# Patient Record
Sex: Female | Born: 1955 | Race: Black or African American | Hispanic: No | Marital: Married | Smoking: Former smoker
Health system: Southern US, Community
[De-identification: ages and names within clinical notes are randomized; demographics above are authoritative.]

## PROBLEM LIST (undated history)

## (undated) ENCOUNTER — Emergency Department (HOSPITAL_COMMUNITY): Admission: EM | Payer: BC Managed Care – PPO | Source: Home / Self Care

## (undated) DIAGNOSIS — I5032 Chronic diastolic (congestive) heart failure: Secondary | ICD-10-CM

## (undated) DIAGNOSIS — R011 Cardiac murmur, unspecified: Secondary | ICD-10-CM

## (undated) DIAGNOSIS — I779 Disorder of arteries and arterioles, unspecified: Secondary | ICD-10-CM

## (undated) DIAGNOSIS — Z992 Dependence on renal dialysis: Secondary | ICD-10-CM

## (undated) DIAGNOSIS — I219 Acute myocardial infarction, unspecified: Secondary | ICD-10-CM

## (undated) DIAGNOSIS — N184 Chronic kidney disease, stage 4 (severe): Secondary | ICD-10-CM

## (undated) DIAGNOSIS — D469 Myelodysplastic syndrome, unspecified: Secondary | ICD-10-CM

## (undated) DIAGNOSIS — I10 Essential (primary) hypertension: Secondary | ICD-10-CM

## (undated) DIAGNOSIS — I350 Nonrheumatic aortic (valve) stenosis: Secondary | ICD-10-CM

## (undated) DIAGNOSIS — I480 Paroxysmal atrial fibrillation: Secondary | ICD-10-CM

## (undated) DIAGNOSIS — D539 Nutritional anemia, unspecified: Secondary | ICD-10-CM

## (undated) DIAGNOSIS — D649 Anemia, unspecified: Secondary | ICD-10-CM

## (undated) DIAGNOSIS — E039 Hypothyroidism, unspecified: Secondary | ICD-10-CM

## (undated) DIAGNOSIS — M109 Gout, unspecified: Secondary | ICD-10-CM

## (undated) DIAGNOSIS — I050 Rheumatic mitral stenosis: Secondary | ICD-10-CM

## (undated) DIAGNOSIS — N179 Acute kidney failure, unspecified: Secondary | ICD-10-CM

## (undated) DIAGNOSIS — N186 End stage renal disease: Secondary | ICD-10-CM

## (undated) DIAGNOSIS — I739 Peripheral vascular disease, unspecified: Secondary | ICD-10-CM

## (undated) DIAGNOSIS — E119 Type 2 diabetes mellitus without complications: Secondary | ICD-10-CM

## (undated) DIAGNOSIS — R0600 Dyspnea, unspecified: Secondary | ICD-10-CM

## (undated) DIAGNOSIS — C189 Malignant neoplasm of colon, unspecified: Secondary | ICD-10-CM

## (undated) DIAGNOSIS — C801 Malignant (primary) neoplasm, unspecified: Secondary | ICD-10-CM

## (undated) DIAGNOSIS — I272 Pulmonary hypertension, unspecified: Secondary | ICD-10-CM

## (undated) DIAGNOSIS — I701 Atherosclerosis of renal artery: Secondary | ICD-10-CM

## (undated) HISTORY — PX: TUBAL LIGATION: SHX77

## (undated) HISTORY — DX: Gout, unspecified: M10.9

## (undated) HISTORY — PX: COLON SURGERY: SHX602

---
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
## 1997-12-27 ENCOUNTER — Other Ambulatory Visit: Admission: RE | Admit: 1997-12-27 | Discharge: 1997-12-27 | Payer: Self-pay | Admitting: Internal Medicine

## 1997-12-28 ENCOUNTER — Other Ambulatory Visit: Admission: RE | Admit: 1997-12-28 | Discharge: 1997-12-28 | Payer: Self-pay | Admitting: *Deleted

## 1998-01-02 ENCOUNTER — Ambulatory Visit (HOSPITAL_COMMUNITY): Admission: RE | Admit: 1998-01-02 | Discharge: 1998-01-02 | Payer: Self-pay | Admitting: *Deleted

## 1999-03-30 ENCOUNTER — Other Ambulatory Visit: Admission: RE | Admit: 1999-03-30 | Discharge: 1999-03-30 | Payer: Self-pay | Admitting: Internal Medicine

## 2000-03-25 ENCOUNTER — Other Ambulatory Visit: Admission: RE | Admit: 2000-03-25 | Discharge: 2000-03-25 | Payer: Self-pay | Admitting: Internal Medicine

## 2000-07-30 ENCOUNTER — Emergency Department (HOSPITAL_COMMUNITY): Admission: EM | Admit: 2000-07-30 | Discharge: 2000-07-30 | Payer: Self-pay | Admitting: Emergency Medicine

## 2001-06-02 ENCOUNTER — Other Ambulatory Visit: Admission: RE | Admit: 2001-06-02 | Discharge: 2001-06-02 | Payer: Self-pay | Admitting: Internal Medicine

## 2006-07-15 DIAGNOSIS — C189 Malignant neoplasm of colon, unspecified: Secondary | ICD-10-CM

## 2006-07-15 DIAGNOSIS — C801 Malignant (primary) neoplasm, unspecified: Secondary | ICD-10-CM

## 2006-07-15 HISTORY — DX: Malignant neoplasm of colon, unspecified: C18.9

## 2006-07-15 HISTORY — DX: Malignant (primary) neoplasm, unspecified: C80.1

## 2006-10-22 ENCOUNTER — Encounter (INDEPENDENT_AMBULATORY_CARE_PROVIDER_SITE_OTHER): Payer: Self-pay | Admitting: Specialist

## 2006-10-22 ENCOUNTER — Ambulatory Visit (HOSPITAL_COMMUNITY): Admission: RE | Admit: 2006-10-22 | Discharge: 2006-10-22 | Payer: Self-pay | Admitting: Gastroenterology

## 2006-10-27 ENCOUNTER — Encounter: Admission: RE | Admit: 2006-10-27 | Discharge: 2006-10-27 | Payer: Self-pay | Admitting: Gastroenterology

## 2006-11-19 ENCOUNTER — Encounter (INDEPENDENT_AMBULATORY_CARE_PROVIDER_SITE_OTHER): Payer: Self-pay | Admitting: Specialist

## 2006-11-19 ENCOUNTER — Inpatient Hospital Stay (HOSPITAL_COMMUNITY): Admission: RE | Admit: 2006-11-19 | Discharge: 2006-11-25 | Payer: Self-pay | Admitting: General Surgery

## 2006-11-24 ENCOUNTER — Ambulatory Visit: Payer: Self-pay | Admitting: Oncology

## 2006-12-05 LAB — COMPREHENSIVE METABOLIC PANEL
ALT: 14 U/L (ref 0–35)
AST: 13 U/L (ref 0–37)
Albumin: 4.4 g/dL (ref 3.5–5.2)
Alkaline Phosphatase: 70 U/L (ref 39–117)
BUN: 14 mg/dL (ref 6–23)
CO2: 27 mEq/L (ref 19–32)
Calcium: 9.7 mg/dL (ref 8.4–10.5)
Chloride: 103 mEq/L (ref 96–112)
Creatinine, Ser: 0.94 mg/dL (ref 0.40–1.20)
Glucose, Bld: 208 mg/dL — ABNORMAL HIGH (ref 70–99)
Potassium: 4 mEq/L (ref 3.5–5.3)
Sodium: 141 mEq/L (ref 135–145)
Total Bilirubin: 0.5 mg/dL (ref 0.3–1.2)
Total Protein: 7.6 g/dL (ref 6.0–8.3)

## 2006-12-05 LAB — CEA: CEA: 1.2 ng/mL (ref 0.0–5.0)

## 2006-12-05 LAB — CBC WITH DIFFERENTIAL/PLATELET
BASO%: 0.4 % (ref 0.0–2.0)
Basophils Absolute: 0 10*3/uL (ref 0.0–0.1)
EOS%: 2.4 % (ref 0.0–7.0)
Eosinophils Absolute: 0.2 10*3/uL (ref 0.0–0.5)
HCT: 30.7 % — ABNORMAL LOW (ref 34.8–46.6)
HGB: 10.6 g/dL — ABNORMAL LOW (ref 11.6–15.9)
LYMPH%: 17.7 % (ref 14.0–48.0)
MCH: 33.4 pg (ref 26.0–34.0)
MCHC: 34.5 g/dL (ref 32.0–36.0)
MCV: 96.9 fL (ref 81.0–101.0)
MONO#: 0.7 10*3/uL (ref 0.1–0.9)
MONO%: 8.2 % (ref 0.0–13.0)
NEUT#: 5.7 10*3/uL (ref 1.5–6.5)
NEUT%: 71.3 % (ref 39.6–76.8)
Platelets: 337 10*3/uL (ref 145–400)
RBC: 3.17 10*6/uL — ABNORMAL LOW (ref 3.70–5.32)
RDW: 14.4 % (ref 11.3–14.5)
WBC: 8.1 10*3/uL (ref 3.9–10.0)
lymph#: 1.4 10*3/uL (ref 0.9–3.3)

## 2006-12-18 ENCOUNTER — Ambulatory Visit (HOSPITAL_BASED_OUTPATIENT_CLINIC_OR_DEPARTMENT_OTHER): Admission: RE | Admit: 2006-12-18 | Discharge: 2006-12-18 | Payer: Self-pay | Admitting: General Surgery

## 2006-12-31 LAB — COMPREHENSIVE METABOLIC PANEL
ALT: 17 U/L (ref 0–35)
AST: 17 U/L (ref 0–37)
Albumin: 3.5 g/dL (ref 3.5–5.2)
Alkaline Phosphatase: 59 U/L (ref 39–117)
BUN: 10 mg/dL (ref 6–23)
CO2: 26 mEq/L (ref 19–32)
Calcium: 9.3 mg/dL (ref 8.4–10.5)
Chloride: 105 mEq/L (ref 96–112)
Creatinine, Ser: 0.78 mg/dL (ref 0.40–1.20)
Glucose, Bld: 175 mg/dL — ABNORMAL HIGH (ref 70–99)
Potassium: 4 mEq/L (ref 3.5–5.3)
Sodium: 141 mEq/L (ref 135–145)
Total Bilirubin: 0.2 mg/dL — ABNORMAL LOW (ref 0.3–1.2)
Total Protein: 7.1 g/dL (ref 6.0–8.3)

## 2006-12-31 LAB — CBC WITH DIFFERENTIAL/PLATELET
BASO%: 0.5 % (ref 0.0–2.0)
Basophils Absolute: 0 10*3/uL (ref 0.0–0.1)
EOS%: 1.4 % (ref 0.0–7.0)
Eosinophils Absolute: 0.1 10*3/uL (ref 0.0–0.5)
HCT: 31.9 % — ABNORMAL LOW (ref 34.8–46.6)
HGB: 11 g/dL — ABNORMAL LOW (ref 11.6–15.9)
LYMPH%: 20.7 % (ref 14.0–48.0)
MCH: 33.2 pg (ref 26.0–34.0)
MCHC: 34.3 g/dL (ref 32.0–36.0)
MCV: 96.6 fL (ref 81.0–101.0)
MONO#: 0.6 10*3/uL (ref 0.1–0.9)
MONO%: 8.4 % (ref 0.0–13.0)
NEUT#: 5.1 10*3/uL (ref 1.5–6.5)
NEUT%: 69 % (ref 39.6–76.8)
Platelets: 298 10*3/uL (ref 145–400)
RBC: 3.31 10*6/uL — ABNORMAL LOW (ref 3.70–5.32)
RDW: 13.8 % (ref 11.3–14.5)
WBC: 7.5 10*3/uL (ref 3.9–10.0)
lymph#: 1.5 10*3/uL (ref 0.9–3.3)

## 2007-01-08 ENCOUNTER — Ambulatory Visit: Payer: Self-pay | Admitting: Oncology

## 2007-01-14 LAB — COMPREHENSIVE METABOLIC PANEL
ALT: 15 U/L (ref 0–35)
AST: 16 U/L (ref 0–37)
Albumin: 4.2 g/dL (ref 3.5–5.2)
Alkaline Phosphatase: 75 U/L (ref 39–117)
BUN: 13 mg/dL (ref 6–23)
CO2: 22 mEq/L (ref 19–32)
Calcium: 8.8 mg/dL (ref 8.4–10.5)
Chloride: 102 mEq/L (ref 96–112)
Creatinine, Ser: 0.86 mg/dL (ref 0.40–1.20)
Glucose, Bld: 201 mg/dL — ABNORMAL HIGH (ref 70–99)
Potassium: 4 mEq/L (ref 3.5–5.3)
Sodium: 136 mEq/L (ref 135–145)
Total Bilirubin: 0.5 mg/dL (ref 0.3–1.2)
Total Protein: 7.5 g/dL (ref 6.0–8.3)

## 2007-01-14 LAB — CBC WITH DIFFERENTIAL/PLATELET
BASO%: 0.6 % (ref 0.0–2.0)
Basophils Absolute: 0 10*3/uL (ref 0.0–0.1)
EOS%: 0.7 % (ref 0.0–7.0)
Eosinophils Absolute: 0.1 10*3/uL (ref 0.0–0.5)
HCT: 31 % — ABNORMAL LOW (ref 34.8–46.6)
HGB: 10.9 g/dL — ABNORMAL LOW (ref 11.6–15.9)
LYMPH%: 18.5 % (ref 14.0–48.0)
MCH: 33.7 pg (ref 26.0–34.0)
MCHC: 35.3 g/dL (ref 32.0–36.0)
MCV: 95.4 fL (ref 81.0–101.0)
MONO#: 0.9 10*3/uL (ref 0.1–0.9)
MONO%: 11.5 % (ref 0.0–13.0)
NEUT#: 5.2 10*3/uL (ref 1.5–6.5)
NEUT%: 68.7 % (ref 39.6–76.8)
Platelets: 290 10*3/uL (ref 145–400)
RBC: 3.25 10*6/uL — ABNORMAL LOW (ref 3.70–5.32)
RDW: 13.2 % (ref 11.3–14.5)
WBC: 7.5 10*3/uL (ref 3.9–10.0)
lymph#: 1.4 10*3/uL (ref 0.9–3.3)

## 2007-01-27 LAB — CBC WITH DIFFERENTIAL/PLATELET
BASO%: 0.4 % (ref 0.0–2.0)
Basophils Absolute: 0 10*3/uL (ref 0.0–0.1)
EOS%: 1.2 % (ref 0.0–7.0)
Eosinophils Absolute: 0.1 10*3/uL (ref 0.0–0.5)
HCT: 30.3 % — ABNORMAL LOW (ref 34.8–46.6)
HGB: 10.7 g/dL — ABNORMAL LOW (ref 11.6–15.9)
LYMPH%: 25.4 % (ref 14.0–48.0)
MCH: 34.6 pg — ABNORMAL HIGH (ref 26.0–34.0)
MCHC: 35.2 g/dL (ref 32.0–36.0)
MCV: 98.3 fL (ref 81.0–101.0)
MONO#: 0.7 10*3/uL (ref 0.1–0.9)
MONO%: 13.5 % — ABNORMAL HIGH (ref 0.0–13.0)
NEUT#: 3.1 10*3/uL (ref 1.5–6.5)
NEUT%: 59.5 % (ref 39.6–76.8)
Platelets: 238 10*3/uL (ref 145–400)
RBC: 3.09 10*6/uL — ABNORMAL LOW (ref 3.70–5.32)
RDW: 15.9 % — ABNORMAL HIGH (ref 11.3–14.5)
WBC: 5.2 10*3/uL (ref 3.9–10.0)
lymph#: 1.3 10*3/uL (ref 0.9–3.3)

## 2007-01-27 LAB — COMPREHENSIVE METABOLIC PANEL
ALT: 17 U/L (ref 0–35)
AST: 17 U/L (ref 0–37)
Albumin: 3.3 g/dL — ABNORMAL LOW (ref 3.5–5.2)
Alkaline Phosphatase: 63 U/L (ref 39–117)
BUN: 10 mg/dL (ref 6–23)
CO2: 27 mEq/L (ref 19–32)
Calcium: 9 mg/dL (ref 8.4–10.5)
Chloride: 105 mEq/L (ref 96–112)
Creatinine, Ser: 0.93 mg/dL (ref 0.40–1.20)
Glucose, Bld: 208 mg/dL — ABNORMAL HIGH (ref 70–99)
Potassium: 4.2 mEq/L (ref 3.5–5.3)
Sodium: 140 mEq/L (ref 135–145)
Total Bilirubin: 0.7 mg/dL (ref 0.3–1.2)
Total Protein: 6.7 g/dL (ref 6.0–8.3)

## 2007-02-10 LAB — CBC WITH DIFFERENTIAL/PLATELET
BASO%: 0.5 % (ref 0.0–2.0)
Basophils Absolute: 0 10*3/uL (ref 0.0–0.1)
EOS%: 0.8 % (ref 0.0–7.0)
Eosinophils Absolute: 0.1 10*3/uL (ref 0.0–0.5)
HCT: 32.4 % — ABNORMAL LOW (ref 34.8–46.6)
HGB: 11.2 g/dL — ABNORMAL LOW (ref 11.6–15.9)
LYMPH%: 24.5 % (ref 14.0–48.0)
MCH: 34.3 pg — ABNORMAL HIGH (ref 26.0–34.0)
MCHC: 34.7 g/dL (ref 32.0–36.0)
MCV: 98.7 fL (ref 81.0–101.0)
MONO#: 0.9 10*3/uL (ref 0.1–0.9)
MONO%: 14 % — ABNORMAL HIGH (ref 0.0–13.0)
NEUT#: 3.9 10*3/uL (ref 1.5–6.5)
NEUT%: 60.2 % (ref 39.6–76.8)
Platelets: 185 10*3/uL (ref 145–400)
RBC: 3.28 10*6/uL — ABNORMAL LOW (ref 3.70–5.32)
RDW: 14.5 % (ref 11.3–14.5)
WBC: 6.4 10*3/uL (ref 3.9–10.0)
lymph#: 1.6 10*3/uL (ref 0.9–3.3)

## 2007-02-10 LAB — COMPREHENSIVE METABOLIC PANEL
ALT: 14 U/L (ref 0–35)
AST: 12 U/L (ref 0–37)
Albumin: 3.9 g/dL (ref 3.5–5.2)
Alkaline Phosphatase: 84 U/L (ref 39–117)
BUN: 13 mg/dL (ref 6–23)
CO2: 21 mEq/L (ref 19–32)
Calcium: 8.9 mg/dL (ref 8.4–10.5)
Chloride: 106 mEq/L (ref 96–112)
Creatinine, Ser: 0.87 mg/dL (ref 0.40–1.20)
Glucose, Bld: 208 mg/dL — ABNORMAL HIGH (ref 70–99)
Potassium: 4 mEq/L (ref 3.5–5.3)
Sodium: 142 mEq/L (ref 135–145)
Total Bilirubin: 0.4 mg/dL (ref 0.3–1.2)
Total Protein: 7.3 g/dL (ref 6.0–8.3)

## 2007-02-10 LAB — CEA: CEA: 1.2 ng/mL (ref 0.0–5.0)

## 2007-02-20 ENCOUNTER — Ambulatory Visit: Payer: Self-pay | Admitting: Oncology

## 2007-02-24 LAB — COMPREHENSIVE METABOLIC PANEL
ALT: 13 U/L (ref 0–35)
AST: 14 U/L (ref 0–37)
Albumin: 3.9 g/dL (ref 3.5–5.2)
Alkaline Phosphatase: 84 U/L (ref 39–117)
BUN: 12 mg/dL (ref 6–23)
CO2: 23 mEq/L (ref 19–32)
Calcium: 9 mg/dL (ref 8.4–10.5)
Chloride: 105 mEq/L (ref 96–112)
Creatinine, Ser: 0.86 mg/dL (ref 0.40–1.20)
Glucose, Bld: 199 mg/dL — ABNORMAL HIGH (ref 70–99)
Potassium: 4 mEq/L (ref 3.5–5.3)
Sodium: 142 mEq/L (ref 135–145)
Total Bilirubin: 0.3 mg/dL (ref 0.3–1.2)
Total Protein: 7.2 g/dL (ref 6.0–8.3)

## 2007-02-24 LAB — CBC WITH DIFFERENTIAL/PLATELET
BASO%: 0.5 % (ref 0.0–2.0)
Basophils Absolute: 0 10*3/uL (ref 0.0–0.1)
EOS%: 1.2 % (ref 0.0–7.0)
Eosinophils Absolute: 0 10*3/uL (ref 0.0–0.5)
HCT: 30.9 % — ABNORMAL LOW (ref 34.8–46.6)
HGB: 10.8 g/dL — ABNORMAL LOW (ref 11.6–15.9)
LYMPH%: 30 % (ref 14.0–48.0)
MCH: 34.5 pg — ABNORMAL HIGH (ref 26.0–34.0)
MCHC: 34.9 g/dL (ref 32.0–36.0)
MCV: 98.9 fL (ref 81.0–101.0)
MONO#: 0.6 10*3/uL (ref 0.1–0.9)
MONO%: 14.9 % — ABNORMAL HIGH (ref 0.0–13.0)
NEUT#: 2.2 10*3/uL (ref 1.5–6.5)
NEUT%: 53.4 % (ref 39.6–76.8)
Platelets: 181 10*3/uL (ref 145–400)
RBC: 3.12 10*6/uL — ABNORMAL LOW (ref 3.70–5.32)
RDW: 16.8 % — ABNORMAL HIGH (ref 11.3–14.5)
WBC: 4.1 10*3/uL (ref 3.9–10.0)
lymph#: 1.2 10*3/uL (ref 0.9–3.3)

## 2007-03-24 LAB — COMPREHENSIVE METABOLIC PANEL
ALT: 14 U/L (ref 0–35)
AST: 17 U/L (ref 0–37)
Albumin: 4 g/dL (ref 3.5–5.2)
Alkaline Phosphatase: 83 U/L (ref 39–117)
BUN: 10 mg/dL (ref 6–23)
CO2: 23 mEq/L (ref 19–32)
Calcium: 8.7 mg/dL (ref 8.4–10.5)
Chloride: 106 mEq/L (ref 96–112)
Creatinine, Ser: 0.83 mg/dL (ref 0.40–1.20)
Glucose, Bld: 109 mg/dL — ABNORMAL HIGH (ref 70–99)
Potassium: 4.1 mEq/L (ref 3.5–5.3)
Sodium: 142 mEq/L (ref 135–145)
Total Bilirubin: 0.3 mg/dL (ref 0.3–1.2)
Total Protein: 7.3 g/dL (ref 6.0–8.3)

## 2007-03-24 LAB — CBC WITH DIFFERENTIAL/PLATELET
BASO%: 0.3 % (ref 0.0–2.0)
Basophils Absolute: 0 10*3/uL (ref 0.0–0.1)
EOS%: 0.6 % (ref 0.0–7.0)
Eosinophils Absolute: 0 10*3/uL (ref 0.0–0.5)
HCT: 31.2 % — ABNORMAL LOW (ref 34.8–46.6)
HGB: 11 g/dL — ABNORMAL LOW (ref 11.6–15.9)
LYMPH%: 30.4 % (ref 14.0–48.0)
MCH: 35 pg — ABNORMAL HIGH (ref 26.0–34.0)
MCHC: 35.3 g/dL (ref 32.0–36.0)
MCV: 99.3 fL (ref 81.0–101.0)
MONO#: 0.9 10*3/uL (ref 0.1–0.9)
MONO%: 17.8 % — ABNORMAL HIGH (ref 0.0–13.0)
NEUT#: 2.6 10*3/uL (ref 1.5–6.5)
NEUT%: 50.9 % (ref 39.6–76.8)
Platelets: 180 10*3/uL (ref 145–400)
RBC: 3.14 10*6/uL — ABNORMAL LOW (ref 3.70–5.32)
RDW: 16.7 % — ABNORMAL HIGH (ref 11.3–14.5)
WBC: 5 10*3/uL (ref 3.9–10.0)
lymph#: 1.5 10*3/uL (ref 0.9–3.3)

## 2007-04-06 ENCOUNTER — Ambulatory Visit: Payer: Self-pay | Admitting: Oncology

## 2007-04-07 LAB — CBC WITH DIFFERENTIAL/PLATELET
BASO%: 0.3 % (ref 0.0–2.0)
Basophils Absolute: 0 10*3/uL (ref 0.0–0.1)
EOS%: 0.5 % (ref 0.0–7.0)
Eosinophils Absolute: 0 10*3/uL (ref 0.0–0.5)
HCT: 33.4 % — ABNORMAL LOW (ref 34.8–46.6)
HGB: 11.7 g/dL (ref 11.6–15.9)
LYMPH%: 39.2 % (ref 14.0–48.0)
MCH: 35.4 pg — ABNORMAL HIGH (ref 26.0–34.0)
MCHC: 35.1 g/dL (ref 32.0–36.0)
MCV: 100.9 fL (ref 81.0–101.0)
MONO#: 0.8 10*3/uL (ref 0.1–0.9)
MONO%: 18.3 % — ABNORMAL HIGH (ref 0.0–13.0)
NEUT#: 1.8 10*3/uL (ref 1.5–6.5)
NEUT%: 41.7 % (ref 39.6–76.8)
Platelets: 174 10*3/uL (ref 145–400)
RBC: 3.31 10*6/uL — ABNORMAL LOW (ref 3.70–5.32)
RDW: 17.6 % — ABNORMAL HIGH (ref 11.3–14.5)
WBC: 4.3 10*3/uL (ref 3.9–10.0)
lymph#: 1.7 10*3/uL (ref 0.9–3.3)

## 2007-04-07 LAB — COMPREHENSIVE METABOLIC PANEL
ALT: 16 U/L (ref 0–35)
AST: 18 U/L (ref 0–37)
Albumin: 4.1 g/dL (ref 3.5–5.2)
Alkaline Phosphatase: 86 U/L (ref 39–117)
BUN: 11 mg/dL (ref 6–23)
CO2: 22 mEq/L (ref 19–32)
Calcium: 8.9 mg/dL (ref 8.4–10.5)
Chloride: 102 mEq/L (ref 96–112)
Creatinine, Ser: 0.85 mg/dL (ref 0.40–1.20)
Glucose, Bld: 197 mg/dL — ABNORMAL HIGH (ref 70–99)
Potassium: 4 mEq/L (ref 3.5–5.3)
Sodium: 137 mEq/L (ref 135–145)
Total Bilirubin: 0.3 mg/dL (ref 0.3–1.2)
Total Protein: 7.8 g/dL (ref 6.0–8.3)

## 2007-04-07 LAB — CEA: CEA: 1.2 ng/mL (ref 0.0–5.0)

## 2007-04-21 LAB — CBC WITH DIFFERENTIAL/PLATELET
BASO%: 0.1 % (ref 0.0–2.0)
Basophils Absolute: 0 10*3/uL (ref 0.0–0.1)
EOS%: 0.3 % (ref 0.0–7.0)
Eosinophils Absolute: 0 10*3/uL (ref 0.0–0.5)
HCT: 32.8 % — ABNORMAL LOW (ref 34.8–46.6)
HGB: 11.4 g/dL — ABNORMAL LOW (ref 11.6–15.9)
LYMPH%: 34.6 % (ref 14.0–48.0)
MCH: 35.4 pg — ABNORMAL HIGH (ref 26.0–34.0)
MCHC: 34.8 g/dL (ref 32.0–36.0)
MCV: 101.7 fL — ABNORMAL HIGH (ref 81.0–101.0)
MONO#: 0.8 10*3/uL (ref 0.1–0.9)
MONO%: 17.3 % — ABNORMAL HIGH (ref 0.0–13.0)
NEUT#: 2.1 10*3/uL (ref 1.5–6.5)
NEUT%: 47.7 % (ref 39.6–76.8)
Platelets: 161 10*3/uL (ref 145–400)
RBC: 3.22 10*6/uL — ABNORMAL LOW (ref 3.70–5.32)
RDW: 17.3 % — ABNORMAL HIGH (ref 11.3–14.5)
WBC: 4.4 10*3/uL (ref 3.9–10.0)
lymph#: 1.5 10*3/uL (ref 0.9–3.3)

## 2007-04-21 LAB — COMPREHENSIVE METABOLIC PANEL
ALT: 15 U/L (ref 0–35)
AST: 17 U/L (ref 0–37)
Albumin: 4 g/dL (ref 3.5–5.2)
Alkaline Phosphatase: 81 U/L (ref 39–117)
BUN: 10 mg/dL (ref 6–23)
CO2: 23 mEq/L (ref 19–32)
Calcium: 9.4 mg/dL (ref 8.4–10.5)
Chloride: 101 mEq/L (ref 96–112)
Creatinine, Ser: 0.91 mg/dL (ref 0.40–1.20)
Glucose, Bld: 183 mg/dL — ABNORMAL HIGH (ref 70–99)
Potassium: 4.3 mEq/L (ref 3.5–5.3)
Sodium: 140 mEq/L (ref 135–145)
Total Bilirubin: 0.4 mg/dL (ref 0.3–1.2)
Total Protein: 7.2 g/dL (ref 6.0–8.3)

## 2007-04-21 LAB — CEA: CEA: 0.7 ng/mL (ref 0.0–5.0)

## 2007-05-05 LAB — COMPREHENSIVE METABOLIC PANEL
ALT: 16 U/L (ref 0–35)
AST: 19 U/L (ref 0–37)
Albumin: 4 g/dL (ref 3.5–5.2)
Alkaline Phosphatase: 75 U/L (ref 39–117)
BUN: 17 mg/dL (ref 6–23)
CO2: 23 mEq/L (ref 19–32)
Calcium: 9 mg/dL (ref 8.4–10.5)
Chloride: 104 mEq/L (ref 96–112)
Creatinine, Ser: 1.01 mg/dL (ref 0.40–1.20)
Glucose, Bld: 199 mg/dL — ABNORMAL HIGH (ref 70–99)
Potassium: 4.1 mEq/L (ref 3.5–5.3)
Sodium: 143 mEq/L (ref 135–145)
Total Bilirubin: 0.3 mg/dL (ref 0.3–1.2)
Total Protein: 7.3 g/dL (ref 6.0–8.3)

## 2007-05-05 LAB — CBC WITH DIFFERENTIAL/PLATELET
BASO%: 0.4 % (ref 0.0–2.0)
Basophils Absolute: 0 10*3/uL (ref 0.0–0.1)
EOS%: 0.3 % (ref 0.0–7.0)
Eosinophils Absolute: 0 10*3/uL (ref 0.0–0.5)
HCT: 32 % — ABNORMAL LOW (ref 34.8–46.6)
HGB: 11.3 g/dL — ABNORMAL LOW (ref 11.6–15.9)
LYMPH%: 30.7 % (ref 14.0–48.0)
MCH: 35.8 pg — ABNORMAL HIGH (ref 26.0–34.0)
MCHC: 35.2 g/dL (ref 32.0–36.0)
MCV: 101.8 fL — ABNORMAL HIGH (ref 81.0–101.0)
MONO#: 0.7 10*3/uL (ref 0.1–0.9)
MONO%: 16.4 % — ABNORMAL HIGH (ref 0.0–13.0)
NEUT#: 2.1 10*3/uL (ref 1.5–6.5)
NEUT%: 52.2 % (ref 39.6–76.8)
Platelets: 175 10*3/uL (ref 145–400)
RBC: 3.15 10*6/uL — ABNORMAL LOW (ref 3.70–5.32)
RDW: 18 % — ABNORMAL HIGH (ref 11.3–14.5)
WBC: 4 10*3/uL (ref 3.9–10.0)
lymph#: 1.2 10*3/uL (ref 0.9–3.3)

## 2007-05-05 LAB — CEA: CEA: 1.1 ng/mL (ref 0.0–5.0)

## 2007-05-19 LAB — COMPREHENSIVE METABOLIC PANEL
ALT: 23 U/L (ref 0–35)
AST: 24 U/L (ref 0–37)
Albumin: 3.9 g/dL (ref 3.5–5.2)
Alkaline Phosphatase: 73 U/L (ref 39–117)
BUN: 12 mg/dL (ref 6–23)
CO2: 22 mEq/L (ref 19–32)
Calcium: 9 mg/dL (ref 8.4–10.5)
Chloride: 103 mEq/L (ref 96–112)
Creatinine, Ser: 0.91 mg/dL (ref 0.40–1.20)
Glucose, Bld: 252 mg/dL — ABNORMAL HIGH (ref 70–99)
Potassium: 4 mEq/L (ref 3.5–5.3)
Sodium: 140 mEq/L (ref 135–145)
Total Bilirubin: 0.3 mg/dL (ref 0.3–1.2)
Total Protein: 7.2 g/dL (ref 6.0–8.3)

## 2007-05-19 LAB — CBC WITH DIFFERENTIAL/PLATELET
BASO%: 0.3 % (ref 0.0–2.0)
Basophils Absolute: 0 10*3/uL (ref 0.0–0.1)
EOS%: 0.4 % (ref 0.0–7.0)
Eosinophils Absolute: 0 10*3/uL (ref 0.0–0.5)
HCT: 31.8 % — ABNORMAL LOW (ref 34.8–46.6)
HGB: 11.1 g/dL — ABNORMAL LOW (ref 11.6–15.9)
LYMPH%: 30.6 % (ref 14.0–48.0)
MCH: 36.5 pg — ABNORMAL HIGH (ref 26.0–34.0)
MCHC: 35 g/dL (ref 32.0–36.0)
MCV: 104.3 fL — ABNORMAL HIGH (ref 81.0–101.0)
MONO#: 0.8 10*3/uL (ref 0.1–0.9)
MONO%: 18.1 % — ABNORMAL HIGH (ref 0.0–13.0)
NEUT#: 2.1 10*3/uL (ref 1.5–6.5)
NEUT%: 50.6 % (ref 39.6–76.8)
Platelets: 151 10*3/uL (ref 145–400)
RBC: 3.05 10*6/uL — ABNORMAL LOW (ref 3.70–5.32)
RDW: 17.3 % — ABNORMAL HIGH (ref 11.3–14.5)
WBC: 4.2 10*3/uL (ref 3.9–10.0)
lymph#: 1.3 10*3/uL (ref 0.9–3.3)

## 2007-05-19 LAB — CEA: CEA: 1.1 ng/mL (ref 0.0–5.0)

## 2007-05-20 ENCOUNTER — Ambulatory Visit: Payer: Self-pay | Admitting: Oncology

## 2007-06-02 LAB — CBC WITH DIFFERENTIAL/PLATELET
BASO%: 0.2 % (ref 0.0–2.0)
Basophils Absolute: 0 10*3/uL (ref 0.0–0.1)
EOS%: 0.3 % (ref 0.0–7.0)
Eosinophils Absolute: 0 10*3/uL (ref 0.0–0.5)
HCT: 33.7 % — ABNORMAL LOW (ref 34.8–46.6)
HGB: 11.7 g/dL (ref 11.6–15.9)
LYMPH%: 30.4 % (ref 14.0–48.0)
MCH: 35.8 pg — ABNORMAL HIGH (ref 26.0–34.0)
MCHC: 34.8 g/dL (ref 32.0–36.0)
MCV: 103 fL — ABNORMAL HIGH (ref 81.0–101.0)
MONO#: 1.3 10*3/uL — ABNORMAL HIGH (ref 0.1–0.9)
MONO%: 25.4 % — ABNORMAL HIGH (ref 0.0–13.0)
NEUT#: 2.3 10*3/uL (ref 1.5–6.5)
NEUT%: 43.7 % (ref 39.6–76.8)
Platelets: 140 10*3/uL — ABNORMAL LOW (ref 145–400)
RBC: 3.27 10*6/uL — ABNORMAL LOW (ref 3.70–5.32)
RDW: 17 % — ABNORMAL HIGH (ref 11.3–14.5)
WBC: 5.2 10*3/uL (ref 3.9–10.0)
lymph#: 1.6 10*3/uL (ref 0.9–3.3)

## 2007-06-02 LAB — COMPREHENSIVE METABOLIC PANEL
ALT: 22 U/L (ref 0–35)
AST: 26 U/L (ref 0–37)
Albumin: 3.9 g/dL (ref 3.5–5.2)
Alkaline Phosphatase: 79 U/L (ref 39–117)
BUN: 10 mg/dL (ref 6–23)
CO2: 22 mEq/L (ref 19–32)
Calcium: 9.1 mg/dL (ref 8.4–10.5)
Chloride: 105 mEq/L (ref 96–112)
Creatinine, Ser: 0.89 mg/dL (ref 0.40–1.20)
Glucose, Bld: 141 mg/dL — ABNORMAL HIGH (ref 70–99)
Potassium: 4.3 mEq/L (ref 3.5–5.3)
Sodium: 144 mEq/L (ref 135–145)
Total Bilirubin: 0.4 mg/dL (ref 0.3–1.2)
Total Protein: 7.5 g/dL (ref 6.0–8.3)

## 2007-06-02 LAB — CEA: CEA: 0.9 ng/mL (ref 0.0–5.0)

## 2007-06-29 ENCOUNTER — Ambulatory Visit (HOSPITAL_COMMUNITY): Admission: RE | Admit: 2007-06-29 | Discharge: 2007-06-29 | Payer: Self-pay | Admitting: Oncology

## 2007-06-30 ENCOUNTER — Ambulatory Visit: Payer: Self-pay | Admitting: Oncology

## 2007-07-02 LAB — COMPREHENSIVE METABOLIC PANEL
ALT: 17 U/L (ref 0–35)
AST: 19 U/L (ref 0–37)
Albumin: 4 g/dL (ref 3.5–5.2)
Alkaline Phosphatase: 72 U/L (ref 39–117)
BUN: 13 mg/dL (ref 6–23)
CO2: 24 mEq/L (ref 19–32)
Calcium: 9.1 mg/dL (ref 8.4–10.5)
Chloride: 104 mEq/L (ref 96–112)
Creatinine, Ser: 0.81 mg/dL (ref 0.40–1.20)
Glucose, Bld: 171 mg/dL — ABNORMAL HIGH (ref 70–99)
Potassium: 4 mEq/L (ref 3.5–5.3)
Sodium: 141 mEq/L (ref 135–145)
Total Bilirubin: 0.4 mg/dL (ref 0.3–1.2)
Total Protein: 7.2 g/dL (ref 6.0–8.3)

## 2007-07-02 LAB — CBC WITH DIFFERENTIAL/PLATELET
BASO%: 0.7 % (ref 0.0–2.0)
Basophils Absolute: 0 10*3/uL (ref 0.0–0.1)
EOS%: 0.1 % (ref 0.0–7.0)
Eosinophils Absolute: 0 10*3/uL (ref 0.0–0.5)
HCT: 34.2 % — ABNORMAL LOW (ref 34.8–46.6)
HGB: 11.9 g/dL (ref 11.6–15.9)
LYMPH%: 30.3 % (ref 14.0–48.0)
MCH: 36.2 pg — ABNORMAL HIGH (ref 26.0–34.0)
MCHC: 34.8 g/dL (ref 32.0–36.0)
MCV: 103.9 fL — ABNORMAL HIGH (ref 81.0–101.0)
MONO#: 0.8 10*3/uL (ref 0.1–0.9)
MONO%: 13.1 % — ABNORMAL HIGH (ref 0.0–13.0)
NEUT#: 3.2 10*3/uL (ref 1.5–6.5)
NEUT%: 55.8 % (ref 39.6–76.8)
Platelets: 225 10*3/uL (ref 145–400)
RBC: 3.29 10*6/uL — ABNORMAL LOW (ref 3.70–5.32)
RDW: 15.2 % — ABNORMAL HIGH (ref 11.3–14.5)
WBC: 5.8 10*3/uL (ref 3.9–10.0)
lymph#: 1.8 10*3/uL (ref 0.9–3.3)

## 2007-07-02 LAB — CEA: CEA: 0.5 ng/mL (ref 0.0–5.0)

## 2007-08-12 ENCOUNTER — Ambulatory Visit: Payer: Self-pay | Admitting: Oncology

## 2007-09-25 ENCOUNTER — Ambulatory Visit: Payer: Self-pay | Admitting: Oncology

## 2007-09-29 LAB — CBC WITH DIFFERENTIAL/PLATELET
BASO%: 0.5 % (ref 0.0–2.0)
Basophils Absolute: 0 10*3/uL (ref 0.0–0.1)
EOS%: 0.3 % (ref 0.0–7.0)
Eosinophils Absolute: 0 10*3/uL (ref 0.0–0.5)
HCT: 35.2 % (ref 34.8–46.6)
HGB: 12.4 g/dL (ref 11.6–15.9)
LYMPH%: 23.8 % (ref 14.0–48.0)
MCH: 34.1 pg — ABNORMAL HIGH (ref 26.0–34.0)
MCHC: 35.2 g/dL (ref 32.0–36.0)
MCV: 97.1 fL (ref 81.0–101.0)
MONO#: 0.6 10*3/uL (ref 0.1–0.9)
MONO%: 7.8 % (ref 0.0–13.0)
NEUT#: 4.9 10*3/uL (ref 1.5–6.5)
NEUT%: 67.6 % (ref 39.6–76.8)
Platelets: 243 10*3/uL (ref 145–400)
RBC: 3.62 10*6/uL — ABNORMAL LOW (ref 3.70–5.32)
RDW: 13.8 % (ref 11.3–14.5)
WBC: 7.3 10*3/uL (ref 3.9–10.0)
lymph#: 1.7 10*3/uL (ref 0.9–3.3)

## 2007-09-29 LAB — COMPREHENSIVE METABOLIC PANEL
ALT: 12 U/L (ref 0–35)
AST: 15 U/L (ref 0–37)
Albumin: 4.1 g/dL (ref 3.5–5.2)
Alkaline Phosphatase: 83 U/L (ref 39–117)
BUN: 16 mg/dL (ref 6–23)
CO2: 24 mEq/L (ref 19–32)
Calcium: 9.4 mg/dL (ref 8.4–10.5)
Chloride: 101 mEq/L (ref 96–112)
Creatinine, Ser: 0.77 mg/dL (ref 0.40–1.20)
Glucose, Bld: 226 mg/dL — ABNORMAL HIGH (ref 70–99)
Potassium: 4.2 mEq/L (ref 3.5–5.3)
Sodium: 139 mEq/L (ref 135–145)
Total Bilirubin: 0.5 mg/dL (ref 0.3–1.2)
Total Protein: 7.5 g/dL (ref 6.0–8.3)

## 2007-09-29 LAB — CEA: CEA: <0.5 ng/mL (ref 0.0–5.0)

## 2007-11-10 ENCOUNTER — Ambulatory Visit: Payer: Self-pay | Admitting: Oncology

## 2007-12-28 ENCOUNTER — Ambulatory Visit: Payer: Self-pay | Admitting: Oncology

## 2007-12-30 ENCOUNTER — Ambulatory Visit (HOSPITAL_COMMUNITY): Admission: RE | Admit: 2007-12-30 | Discharge: 2007-12-30 | Payer: Self-pay | Admitting: Oncology

## 2007-12-30 LAB — COMPREHENSIVE METABOLIC PANEL
ALT: 13 U/L (ref 0–35)
AST: 14 U/L (ref 0–37)
Albumin: 4 g/dL (ref 3.5–5.2)
Alkaline Phosphatase: 82 U/L (ref 39–117)
BUN: 13 mg/dL (ref 6–23)
CO2: 23 mEq/L (ref 19–32)
Calcium: 9.1 mg/dL (ref 8.4–10.5)
Chloride: 101 mEq/L (ref 96–112)
Creatinine, Ser: 0.79 mg/dL (ref 0.40–1.20)
Glucose, Bld: 172 mg/dL — ABNORMAL HIGH (ref 70–99)
Potassium: 4.2 mEq/L (ref 3.5–5.3)
Sodium: 139 mEq/L (ref 135–145)
Total Bilirubin: 0.4 mg/dL (ref 0.3–1.2)
Total Protein: 7.2 g/dL (ref 6.0–8.3)

## 2007-12-30 LAB — CBC WITH DIFFERENTIAL/PLATELET
BASO%: 0.3 % (ref 0.0–2.0)
Basophils Absolute: 0 10*3/uL (ref 0.0–0.1)
EOS%: 0.6 % (ref 0.0–7.0)
Eosinophils Absolute: 0 10*3/uL (ref 0.0–0.5)
HCT: 36.7 % (ref 34.8–46.6)
HGB: 12.7 g/dL (ref 11.6–15.9)
LYMPH%: 23 % (ref 14.0–48.0)
MCH: 33.7 pg (ref 26.0–34.0)
MCHC: 34.6 g/dL (ref 32.0–36.0)
MCV: 97.4 fL (ref 81.0–101.0)
MONO#: 0.7 10*3/uL (ref 0.1–0.9)
MONO%: 10 % (ref 0.0–13.0)
NEUT#: 4.4 10*3/uL (ref 1.5–6.5)
NEUT%: 66.1 % (ref 39.6–76.8)
Platelets: 216 10*3/uL (ref 145–400)
RBC: 3.77 10*6/uL (ref 3.70–5.32)
RDW: 14.5 % (ref 11.3–14.5)
WBC: 6.6 10*3/uL (ref 3.9–10.0)
lymph#: 1.5 10*3/uL (ref 0.9–3.3)

## 2007-12-30 LAB — CEA: CEA: 1.1 ng/mL (ref 0.0–5.0)

## 2008-03-03 ENCOUNTER — Ambulatory Visit (HOSPITAL_COMMUNITY): Admission: RE | Admit: 2008-03-03 | Discharge: 2008-03-03 | Payer: Self-pay | Admitting: General Surgery

## 2008-04-05 ENCOUNTER — Ambulatory Visit: Payer: Self-pay | Admitting: Oncology

## 2008-04-07 LAB — CBC WITH DIFFERENTIAL/PLATELET
BASO%: 0.3 % (ref 0.0–2.0)
Basophils Absolute: 0 10*3/uL (ref 0.0–0.1)
EOS%: 0.4 % (ref 0.0–7.0)
Eosinophils Absolute: 0 10*3/uL (ref 0.0–0.5)
HCT: 39 % (ref 34.8–46.6)
HGB: 13.4 g/dL (ref 11.6–15.9)
LYMPH%: 19 % (ref 14.0–48.0)
MCH: 34.7 pg — ABNORMAL HIGH (ref 26.0–34.0)
MCHC: 34.3 g/dL (ref 32.0–36.0)
MCV: 101 fL (ref 81.0–101.0)
MONO#: 0.7 10*3/uL (ref 0.1–0.9)
MONO%: 9.4 % (ref 0.0–13.0)
NEUT#: 5.5 10*3/uL (ref 1.5–6.5)
NEUT%: 70.9 % (ref 39.6–76.8)
Platelets: 238 10*3/uL (ref 145–400)
RBC: 3.86 10*6/uL (ref 3.70–5.32)
RDW: 14.5 % (ref 11.3–14.5)
WBC: 7.8 10*3/uL (ref 3.9–10.0)
lymph#: 1.5 10*3/uL (ref 0.9–3.3)

## 2008-04-07 LAB — COMPREHENSIVE METABOLIC PANEL
ALT: 21 U/L (ref 0–35)
AST: 19 U/L (ref 0–37)
Albumin: 4.3 g/dL (ref 3.5–5.2)
Alkaline Phosphatase: 79 U/L (ref 39–117)
BUN: 12 mg/dL (ref 6–23)
CO2: 25 mEq/L (ref 19–32)
Calcium: 9.7 mg/dL (ref 8.4–10.5)
Chloride: 101 mEq/L (ref 96–112)
Creatinine, Ser: 0.81 mg/dL (ref 0.40–1.20)
Glucose, Bld: 196 mg/dL — ABNORMAL HIGH (ref 70–99)
Potassium: 4.4 mEq/L (ref 3.5–5.3)
Sodium: 141 mEq/L (ref 135–145)
Total Bilirubin: 0.5 mg/dL (ref 0.3–1.2)
Total Protein: 7.8 g/dL (ref 6.0–8.3)

## 2008-04-07 LAB — CEA: CEA: 1 ng/mL (ref 0.0–5.0)

## 2008-07-11 ENCOUNTER — Ambulatory Visit: Payer: Self-pay | Admitting: Oncology

## 2008-07-13 LAB — COMPREHENSIVE METABOLIC PANEL
ALT: 16 U/L (ref 0–35)
AST: 15 U/L (ref 0–37)
Albumin: 4 g/dL (ref 3.5–5.2)
Alkaline Phosphatase: 71 U/L (ref 39–117)
BUN: 14 mg/dL (ref 6–23)
CO2: 25 mEq/L (ref 19–32)
Calcium: 9 mg/dL (ref 8.4–10.5)
Chloride: 102 mEq/L (ref 96–112)
Creatinine, Ser: 0.8 mg/dL (ref 0.40–1.20)
Glucose, Bld: 146 mg/dL — ABNORMAL HIGH (ref 70–99)
Potassium: 4.1 mEq/L (ref 3.5–5.3)
Sodium: 142 mEq/L (ref 135–145)
Total Bilirubin: 0.4 mg/dL (ref 0.3–1.2)
Total Protein: 7.3 g/dL (ref 6.0–8.3)

## 2008-07-13 LAB — CBC WITH DIFFERENTIAL/PLATELET
BASO%: 0.2 % (ref 0.0–2.0)
Basophils Absolute: 0 10*3/uL (ref 0.0–0.1)
EOS%: 0.5 % (ref 0.0–7.0)
Eosinophils Absolute: 0 10*3/uL (ref 0.0–0.5)
HCT: 34.8 % (ref 34.8–46.6)
HGB: 11.9 g/dL (ref 11.6–15.9)
LYMPH%: 19.9 % (ref 14.0–48.0)
MCH: 35 pg — ABNORMAL HIGH (ref 26.0–34.0)
MCHC: 34.2 g/dL (ref 32.0–36.0)
MCV: 102.1 fL — ABNORMAL HIGH (ref 81.0–101.0)
MONO#: 0.9 10*3/uL (ref 0.1–0.9)
MONO%: 10.6 % (ref 0.0–13.0)
NEUT#: 5.9 10*3/uL (ref 1.5–6.5)
NEUT%: 68.8 % (ref 39.6–76.8)
Platelets: 217 10*3/uL (ref 145–400)
RBC: 3.41 10*6/uL — ABNORMAL LOW (ref 3.70–5.32)
RDW: 14 % (ref 11.3–14.5)
WBC: 8.6 10*3/uL (ref 3.9–10.0)
lymph#: 1.7 10*3/uL (ref 0.9–3.3)

## 2008-07-13 LAB — CEA: CEA: 1 ng/mL (ref 0.0–5.0)

## 2008-07-14 ENCOUNTER — Ambulatory Visit (HOSPITAL_COMMUNITY): Admission: RE | Admit: 2008-07-14 | Discharge: 2008-07-14 | Payer: Self-pay | Admitting: Oncology

## 2008-10-19 ENCOUNTER — Ambulatory Visit: Payer: Self-pay | Admitting: Oncology

## 2008-10-21 LAB — CBC WITH DIFFERENTIAL/PLATELET
BASO%: 0.5 % (ref 0.0–2.0)
Basophils Absolute: 0 10*3/uL (ref 0.0–0.1)
EOS%: 0.7 % (ref 0.0–7.0)
Eosinophils Absolute: 0.1 10*3/uL (ref 0.0–0.5)
HCT: 34.8 % (ref 34.8–46.6)
HGB: 11.9 g/dL (ref 11.6–15.9)
LYMPH%: 24.5 % (ref 14.0–49.7)
MCH: 35.1 pg — ABNORMAL HIGH (ref 25.1–34.0)
MCHC: 34.3 g/dL (ref 31.5–36.0)
MCV: 102.2 fL — ABNORMAL HIGH (ref 79.5–101.0)
MONO#: 0.6 10*3/uL (ref 0.1–0.9)
MONO%: 8.6 % (ref 0.0–14.0)
NEUT#: 4.9 10*3/uL (ref 1.5–6.5)
NEUT%: 65.7 % (ref 38.4–76.8)
Platelets: 217 10*3/uL (ref 145–400)
RBC: 3.4 10*6/uL — ABNORMAL LOW (ref 3.70–5.45)
RDW: 13.9 % (ref 11.2–14.5)
WBC: 7.5 10*3/uL (ref 3.9–10.3)
lymph#: 1.8 10*3/uL (ref 0.9–3.3)

## 2008-10-21 LAB — COMPREHENSIVE METABOLIC PANEL
ALT: 21 U/L (ref 0–35)
AST: 22 U/L (ref 0–37)
Albumin: 3.7 g/dL (ref 3.5–5.2)
Alkaline Phosphatase: 60 U/L (ref 39–117)
BUN: 11 mg/dL (ref 6–23)
CO2: 28 mEq/L (ref 19–32)
Calcium: 9.6 mg/dL (ref 8.4–10.5)
Chloride: 102 mEq/L (ref 96–112)
Creatinine, Ser: 0.83 mg/dL (ref 0.40–1.20)
Glucose, Bld: 167 mg/dL — ABNORMAL HIGH (ref 70–99)
Potassium: 3.7 mEq/L (ref 3.5–5.3)
Sodium: 139 mEq/L (ref 135–145)
Total Bilirubin: 0.6 mg/dL (ref 0.3–1.2)
Total Protein: 7.7 g/dL (ref 6.0–8.3)

## 2008-10-21 LAB — CEA: CEA: 1.1 ng/mL (ref 0.0–5.0)

## 2009-02-02 ENCOUNTER — Ambulatory Visit: Payer: Self-pay | Admitting: Oncology

## 2009-02-06 ENCOUNTER — Ambulatory Visit (HOSPITAL_COMMUNITY): Admission: RE | Admit: 2009-02-06 | Discharge: 2009-02-06 | Payer: Self-pay | Admitting: Oncology

## 2009-02-06 LAB — COMPREHENSIVE METABOLIC PANEL
ALT: 15 U/L (ref 0–35)
AST: 17 U/L (ref 0–37)
Albumin: 3.8 g/dL (ref 3.5–5.2)
Alkaline Phosphatase: 49 U/L (ref 39–117)
BUN: 17 mg/dL (ref 6–23)
CO2: 29 mEq/L (ref 19–32)
Calcium: 9.2 mg/dL (ref 8.4–10.5)
Chloride: 95 mEq/L — ABNORMAL LOW (ref 96–112)
Creatinine, Ser: 0.96 mg/dL (ref 0.40–1.20)
Glucose, Bld: 253 mg/dL — ABNORMAL HIGH (ref 70–99)
Potassium: 4.1 mEq/L (ref 3.5–5.3)
Sodium: 134 mEq/L — ABNORMAL LOW (ref 135–145)
Total Bilirubin: 0.6 mg/dL (ref 0.3–1.2)
Total Protein: 7.7 g/dL (ref 6.0–8.3)

## 2009-02-06 LAB — CBC WITH DIFFERENTIAL/PLATELET
BASO%: 0 % (ref 0.0–2.0)
Basophils Absolute: 0 10*3/uL (ref 0.0–0.1)
EOS%: 1.1 % (ref 0.0–7.0)
Eosinophils Absolute: 0.1 10*3/uL (ref 0.0–0.5)
HCT: 32.8 % — ABNORMAL LOW (ref 34.8–46.6)
HGB: 11.2 g/dL — ABNORMAL LOW (ref 11.6–15.9)
LYMPH%: 23.6 % (ref 14.0–49.7)
MCH: 33.7 pg (ref 25.1–34.0)
MCHC: 34.1 g/dL (ref 31.5–36.0)
MCV: 98.8 fL (ref 79.5–101.0)
MONO#: 0.6 10*3/uL (ref 0.1–0.9)
MONO%: 9.8 % (ref 0.0–14.0)
NEUT#: 4.2 10*3/uL (ref 1.5–6.5)
NEUT%: 65.5 % (ref 38.4–76.8)
Platelets: 223 10*3/uL (ref 145–400)
RBC: 3.32 10*6/uL — ABNORMAL LOW (ref 3.70–5.45)
RDW: 13.4 % (ref 11.2–14.5)
WBC: 6.5 10*3/uL (ref 3.9–10.3)
lymph#: 1.5 10*3/uL (ref 0.9–3.3)

## 2009-02-06 LAB — CEA: CEA: 1 ng/mL (ref 0.0–5.0)

## 2009-07-31 ENCOUNTER — Ambulatory Visit: Payer: Self-pay | Admitting: Oncology

## 2009-08-02 LAB — CBC WITH DIFFERENTIAL/PLATELET
BASO%: 1.4 % (ref 0.0–2.0)
Basophils Absolute: 0.1 10*3/uL (ref 0.0–0.1)
EOS%: 0.8 % (ref 0.0–7.0)
Eosinophils Absolute: 0.1 10*3/uL (ref 0.0–0.5)
HCT: 31.6 % — ABNORMAL LOW (ref 34.8–46.6)
HGB: 10.9 g/dL — ABNORMAL LOW (ref 11.6–15.9)
LYMPH%: 22.4 % (ref 14.0–49.7)
MCH: 37.1 pg — ABNORMAL HIGH (ref 25.1–34.0)
MCHC: 34.5 g/dL (ref 31.5–36.0)
MCV: 107.4 fL — ABNORMAL HIGH (ref 79.5–101.0)
MONO#: 0.8 10*3/uL (ref 0.1–0.9)
MONO%: 10.9 % (ref 0.0–14.0)
NEUT#: 4.8 10*3/uL (ref 1.5–6.5)
NEUT%: 64.5 % (ref 38.4–76.8)
Platelets: 239 10*3/uL (ref 145–400)
RBC: 2.94 10*6/uL — ABNORMAL LOW (ref 3.70–5.45)
RDW: 14.2 % (ref 11.2–14.5)
WBC: 7.5 10*3/uL (ref 3.9–10.3)
lymph#: 1.7 10*3/uL (ref 0.9–3.3)

## 2009-08-02 LAB — COMPREHENSIVE METABOLIC PANEL
ALT: 19 U/L (ref 0–35)
AST: 23 U/L (ref 0–37)
Albumin: 3.6 g/dL (ref 3.5–5.2)
Alkaline Phosphatase: 52 U/L (ref 39–117)
BUN: 10 mg/dL (ref 6–23)
CO2: 29 mEq/L (ref 19–32)
Calcium: 9 mg/dL (ref 8.4–10.5)
Chloride: 103 mEq/L (ref 96–112)
Creatinine, Ser: 0.84 mg/dL (ref 0.40–1.20)
Glucose, Bld: 178 mg/dL — ABNORMAL HIGH (ref 70–99)
Potassium: 3.7 mEq/L (ref 3.5–5.3)
Sodium: 136 mEq/L (ref 135–145)
Total Bilirubin: 0.6 mg/dL (ref 0.3–1.2)
Total Protein: 7.3 g/dL (ref 6.0–8.3)

## 2009-08-02 LAB — CEA: CEA: <0.5 ng/mL (ref 0.0–5.0)

## 2010-01-19 ENCOUNTER — Ambulatory Visit: Payer: Self-pay | Admitting: Oncology

## 2010-01-23 ENCOUNTER — Ambulatory Visit (HOSPITAL_COMMUNITY): Admission: RE | Admit: 2010-01-23 | Discharge: 2010-01-23 | Payer: Self-pay | Admitting: Hematology and Oncology

## 2010-01-23 LAB — COMPREHENSIVE METABOLIC PANEL
ALT: 16 U/L (ref 0–35)
AST: 18 U/L (ref 0–37)
Albumin: 3.7 g/dL (ref 3.5–5.2)
Alkaline Phosphatase: 60 U/L (ref 39–117)
BUN: 12 mg/dL (ref 6–23)
CO2: 26 mEq/L (ref 19–32)
Calcium: 9.1 mg/dL (ref 8.4–10.5)
Chloride: 106 mEq/L (ref 96–112)
Creatinine, Ser: 0.79 mg/dL (ref 0.40–1.20)
Glucose, Bld: 165 mg/dL — ABNORMAL HIGH (ref 70–99)
Potassium: 4.2 mEq/L (ref 3.5–5.3)
Sodium: 139 mEq/L (ref 135–145)
Total Bilirubin: 0.6 mg/dL (ref 0.3–1.2)
Total Protein: 7.7 g/dL (ref 6.0–8.3)

## 2010-01-23 LAB — CBC WITH DIFFERENTIAL/PLATELET
BASO%: 0 % (ref 0.0–2.0)
Basophils Absolute: 0 10*3/uL (ref 0.0–0.1)
EOS%: 1.2 % (ref 0.0–7.0)
Eosinophils Absolute: 0.1 10*3/uL (ref 0.0–0.5)
HCT: 32.6 % — ABNORMAL LOW (ref 34.8–46.6)
HGB: 10.9 g/dL — ABNORMAL LOW (ref 11.6–15.9)
LYMPH%: 28.7 % (ref 14.0–49.7)
MCH: 34.6 pg — ABNORMAL HIGH (ref 25.1–34.0)
MCHC: 33.4 g/dL (ref 31.5–36.0)
MCV: 103.5 fL — ABNORMAL HIGH (ref 79.5–101.0)
MONO#: 0.5 10*3/uL (ref 0.1–0.9)
MONO%: 10.2 % (ref 0.0–14.0)
NEUT#: 3.1 10*3/uL (ref 1.5–6.5)
NEUT%: 59.9 % (ref 38.4–76.8)
Platelets: 211 10*3/uL (ref 145–400)
RBC: 3.15 10*6/uL — ABNORMAL LOW (ref 3.70–5.45)
RDW: 13.3 % (ref 11.2–14.5)
WBC: 5.1 10*3/uL (ref 3.9–10.3)
lymph#: 1.5 10*3/uL (ref 0.9–3.3)
nRBC: 0 % (ref 0–0)

## 2010-01-23 LAB — CEA: CEA: 1 ng/mL (ref 0.0–5.0)

## 2010-07-24 ENCOUNTER — Ambulatory Visit: Payer: Self-pay | Admitting: Oncology

## 2010-09-21 ENCOUNTER — Other Ambulatory Visit: Payer: Self-pay | Admitting: Oncology

## 2010-09-21 ENCOUNTER — Encounter (HOSPITAL_BASED_OUTPATIENT_CLINIC_OR_DEPARTMENT_OTHER): Payer: BC Managed Care – PPO | Admitting: Oncology

## 2010-09-21 DIAGNOSIS — C189 Malignant neoplasm of colon, unspecified: Secondary | ICD-10-CM

## 2010-09-21 DIAGNOSIS — C183 Malignant neoplasm of hepatic flexure: Secondary | ICD-10-CM

## 2010-09-21 DIAGNOSIS — C182 Malignant neoplasm of ascending colon: Secondary | ICD-10-CM

## 2010-09-21 DIAGNOSIS — D649 Anemia, unspecified: Secondary | ICD-10-CM

## 2010-09-21 LAB — COMPREHENSIVE METABOLIC PANEL
ALT: 23 U/L (ref 0–35)
AST: 34 U/L (ref 0–37)
Albumin: 3.6 g/dL (ref 3.5–5.2)
Alkaline Phosphatase: 64 U/L (ref 39–117)
BUN: 15 mg/dL (ref 6–23)
CO2: 29 mEq/L (ref 19–32)
Calcium: 9.5 mg/dL (ref 8.4–10.5)
Chloride: 104 mEq/L (ref 96–112)
Creatinine, Ser: 0.79 mg/dL (ref 0.40–1.20)
Glucose, Bld: 144 mg/dL — ABNORMAL HIGH (ref 70–99)
Potassium: 4 mEq/L (ref 3.5–5.3)
Sodium: 140 mEq/L (ref 135–145)
Total Bilirubin: 0.8 mg/dL (ref 0.3–1.2)
Total Protein: 7.4 g/dL (ref 6.0–8.3)

## 2010-09-21 LAB — CBC WITH DIFFERENTIAL/PLATELET
BASO%: 0.3 % (ref 0.0–2.0)
Basophils Absolute: 0 10*3/uL (ref 0.0–0.1)
EOS%: 1.2 % (ref 0.0–7.0)
Eosinophils Absolute: 0.1 10*3/uL (ref 0.0–0.5)
HCT: 29.4 % — ABNORMAL LOW (ref 34.8–46.6)
HGB: 10.1 g/dL — ABNORMAL LOW (ref 11.6–15.9)
LYMPH%: 15.6 % (ref 14.0–49.7)
MCH: 37 pg — ABNORMAL HIGH (ref 25.1–34.0)
MCHC: 34.5 g/dL (ref 31.5–36.0)
MCV: 107.2 fL — ABNORMAL HIGH (ref 79.5–101.0)
MONO#: 0.9 10*3/uL (ref 0.1–0.9)
MONO%: 11.1 % (ref 0.0–14.0)
NEUT#: 5.7 10*3/uL (ref 1.5–6.5)
NEUT%: 71.8 % (ref 38.4–76.8)
Platelets: 223 10*3/uL (ref 145–400)
RBC: 2.74 10*6/uL — ABNORMAL LOW (ref 3.70–5.45)
RDW: 13.9 % (ref 11.2–14.5)
WBC: 7.9 10*3/uL (ref 3.9–10.3)
lymph#: 1.2 10*3/uL (ref 0.9–3.3)

## 2010-09-21 LAB — CEA: CEA: 1.4 ng/mL (ref 0.0–5.0)

## 2010-11-27 NOTE — Op Note (Signed)
NAMEZILAH, KRYSIAK                ACCOUNT NO.:  [id number]   MEDICAL RECORD NO.:  [id number]          PATIENT TYPE:  AMB   LOCATION:  [place]                          FACILITY:  [facility]   PHYSICIAN:  Gwenyth Ober, M.D.    DATE OF BIRTH:  [birth year]   DATE OF PROCEDURE:  12/18/2006  DATE OF DISCHARGE:                               OPERATIVE REPORT   PREOPERATIVE DIAGNOSIS:  Colon cancer.   POSTOPERATIVE DIAGNOSIS:  Colon cancer.   PROCEDURE:  Placement of right subclavian Port-A-Cath, [name] type.   SURGEON:  Gwenyth Ober, M.D.   ANESTHESIA:  Monitored anesthesia care with local anesthetic.   COMPLICATIONS:  None.   CONDITION:  Stable.   INDICATIONS FOR OPERATION:  The patient is a 55 year old with recently  diagnosed colon cancer who comes in for a Port-A-Cath.   OPERATION:  The patient was taken to the operating room and placed on  the table in a supine position.  After an adequate amount of IV sedation  was given, she was prepped and draped in the usual sterile manner  exposing primarily the right subclavian area.  We anesthetized the  subclavian area on the right side using a 25 gauge needle and 1%  Xylocaine.  The patient is morbidly obese and has very large breasts and  there was difficulty gaining access to the subclavian position.  Multiple sticks were necessary with the 69 gauge large access needle,  however, on the fourth stick, we were able to access the vein.  Two  arterial sticks had been done before that, however, the fourth stick we  were able to get into the subclavian vein, passed a wire using the  Seldinger technique, and under fluoroscopy, guided down into the cardiac  chambers.  There was minimal arrhythmias during the process.   Once we had access to the vein with a wire, we actually made a port site  on the anterior chest wall above the right breast.  We tunneled it into  a pocket and then placed two 3-0 Vicryl sutures in and secured  the port  once we had attached it to the catheter.  We then tunneled the catheter  from the port site up to the subclavian entrance area using the tunneler  provided with the kit.  Once that was done, we passed an introducer and  dilator over the wire into the subclavian system with minimal  difficulty.  We then removed the dilator from the inner portion of the  introducer catheter and then passed the catheter through the introducer  into the venous system.  We were able to confirm that it went down  towards the cardiac chambers using fluoroscopy.  We pulled it back to  where there were about 2 to 3 cm left in the proximal atrium to account  for how much will be pulled back when the patient sits up and the  heaviness of her upper chest and breast will pull the catheter back.   Once we had put it in adequate position, we cut it to the appropriate  length, and attached it to the port with a locking cap.  We sutured it  in place with the 0 Vicryl that were in the pocket, making sure that it  remained upright.  We were able to access it just below the incision  site, palpating the triangular configuration of the port with our  fingers, and passing a Huber needle into it through the skin with good  blood return.   We flushed the catheter initially with heparin flush solution.  We  pulled it back to the appropriate length prior to attaching it to the  port. Once the port was in proper position, it seated with the securing  sutures.  We closed the port site with subcu continuous stitches of 4-0  Vicryl.  The skin at both the subclavian entrance site and the port site  were closed using a running subcuticular stitch of 5-0 Vicryl.  At the  end of the case, Steri-Strips and sterile dressings were applied to the  wound.  We accessed the port again with an angled Huber needle and then  flushed with 3 mL of 1000 units/mL heparin solution.  All counts were  correct.  The patient was taken to the  recovery room where an upright  chest x-ray was to be done for placement.      Gwenyth Ober, M.D.  Electronically Signed     JOW/MEDQ  D:  12/18/2006  T:  12/18/2006  Job:  [id number]

## 2010-11-27 NOTE — Op Note (Signed)
Patricia Mata, Patricia Mata                ACCOUNT NO.:  [id number]   MEDICAL RECORD NO.:  [id number]          PATIENT TYPE:  AMB   LOCATION:  [facility]                          FACILITY:  [facility]   PHYSICIAN:  Gwenyth Ober, M.D.    DATE OF BIRTH:  1956/03/15   DATE OF PROCEDURE:  03/03/2008  DATE OF DISCHARGE:  03/03/2008                               OPERATIVE REPORT   PREOPERATIVE DIAGNOSIS:  Port-A-Cath in right subclavian/internal  jugular vein.   POSTOPERATIVE DIAGNOSIS:  Port-A-Cath in right subclavian/internal  jugular vein.   PROCEDURE:  Removal of her right-sided Port-A-Cath.   SURGEON:  Kathryne Eriksson. Hulen Skains, MD   ANESTHESIA:  Monitored anesthesia care with local approximately 10 mL of  1% Xylocaine.   COMPLICATIONS:  None.   CONDITION:  Stable.   FINDINGS:  The preop chest x-ray showed that the tip of the Port-A-Cath  had floated into the right internal jugular vein.   INDICATIONS FOR OPERATION:  The patient has completed chemotherapy for  colon cancer and now is getting the Port-A-Cath removed.   OPERATION:  The patient was taken to the operating room, placed on table  in the supine position.  After an adequate amount of IV sedation was  given, she was prepped and draped in usual sterile manner exposing the  right subclavian area.   A 25-gauge needle was used to inject transversely at the site of the  previous port reservoir placement.  Approximately 10 mL were used.  A 15-  blade was then used to make a transverse incision down to the  subcutaneous tissue.  We used a hemostat clamp in order to dissect out  the catheter at the junction with the reservoir.  It was clamped with a  hemostat clamp proximally and distally and then subsequently we were  able to pull the tip of the catheter out of the venous system.  We then  dissected the port reservoir out of the pocket using 15-blade and also  blunt dissection with the hemostat.  This was done with minimal blood  loss, most of which  was controlled with electrocautery.   Once the port was removed intact and completely intact, we closed in 2  layers of 3-0 Vicryl subcu layer and then the skin was closed using a  running subcuticular stitch of 4-0 Monocryl.  All counts were correct.  We placed Dermabond, Steri-Strips, and Tegaderm as a dressing.      Gwenyth Ober, M.D.  Electronically Signed     JOW/MEDQ  D:  03/03/2008  T:  03/04/2008  Job:  [id number]

## 2010-11-27 NOTE — Discharge Summary (Signed)
Patricia Mata, Patricia Mata                ACCOUNT NO.:  [id number]   MEDICAL RECORD NO.:  [id number]          PATIENT TYPE:  INP   LOCATION:  [id number]                         FACILITY:  [facility]   PHYSICIAN:  Gwenyth Ober, M.D.    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   SURGEON:  Dr. Hulen Skains.   PRINCIPAL PROCEDURE:  Right hemicolectomy; this was done on the 7th.   DIET:  Currently, she is on a soft diet.   MEDICATIONS ON DISCHARGE:  1. Percocet 5/325; I have given her 30 tablets, one to two as needed      for pain every 4 hours.  2. Reglan one tablet 3 times a day.  3. Norvasc 10 mg once daily, which she was taking prior to admission.  4. Lisinopril and hydrochlorothiazide combination, which she was      taking prior to admission, 20/25 combo, to take one daily.  5. Lumigan drops, one drop per eye nightly.  6. Actos 30 mg orally one tablet daily.  7. Glyburide one tablet of 10 mg daily.  8. Ambien 10 mg one p.o. nightly p.r.n. sleep; she was given 10      tablets and I gave her a prescription for that.   CONDITION:  Stable.   WOUND CARE:  She is having every other staple removed prior to discharge  and Steri-Strips are being placed.  She did shower and pat her wound  dry.   FOLLOWUP:  She will return to see me in 7-10 days to have her other  staples removed.  She has a followup appointment to see oncologist on  likely the 23rd of this month; this was arranged by Dr. Jana Hakim.  She  will see Dr. Alen Blew on May 23 for consultation.  She will not need  chemotherapy at that time, but will likely need chemotherapy  postoperatively.   BRIEF SUMMARY OF HOSPITAL COURSE:  The patient was admitted the day of  surgery with a known a right colon cancer.  She underwent a right  hemicolectomy.  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  Postoperatively, she has done well.  She started on some sips of clear  liquids on postop day #1 and then advanced to a soft diet on the day of  discharge, which was postop day #6.  She was started on albumin and  Reglan regimen which I used routinely for colons to help increase bowel  motility, which she was allowed to start taking a diet on postop day #3  and has been advanced.  Her wound has had some serous drainage from the  lower portion especially, but appears dry today.  We are having every  other staple removed and Steri-Strips applied.  It is  being painted with Betadine.  She is follow up to see me in 7-10 days to  have the rest staples removed and she has a followup appointment to see  Dr. Alen Blew on Dec 05, 2006.  She will resume her preoperative  medications and also have Percocet for pain, Reglan to continue to help  her bowel motility and Ambien to take for sleep if necessary.      Gwenyth Ober, M.D.  Electronically Signed     JOW/MEDQ  D:  11/25/2006  T:  11/25/2006  Job:  [id number]

## 2010-11-27 NOTE — Op Note (Signed)
Patricia Mata, Patricia Mata                ACCOUNT NO.:  [id number]   MEDICAL RECORD NO.:  [id number]          PATIENT TYPE:  INP   LOCATION:  2550                         FACILITY:  [facility]   PHYSICIAN:  Gwenyth Ober, M.D.    DATE OF BIRTH:  June 14, 1956   DATE OF PROCEDURE:  11/19/2006  DATE OF DISCHARGE:                               OPERATIVE REPORT   PREOPERATIVE DIAGNOSIS:  Localized right colon cancer.   POSTOPERATIVE DIAGNOSIS:  Localized right colon cancer.   PROCEDURE:  Open right hemicolectomy.   SURGEON:  Gwenyth Ober, M.D.   ASSISTANT:  Darene Lamer. Hoxworth, M.D.   ANESTHESIA:  General endotracheal.   ESTIMATED BLOOD LOSS:  100 mL.   COMPLICATIONS:  None.   CONDITION:  Stable.   FINDINGS:  The patient had puckered right colon cancer, no evidence of  liver metastasis or nodularity.   The patient had multiple nodules off of her uterus, which appeared to be  myoma.  No evidence of metastatic disease.   INDICATIONS FOR OPERATION:  The patient is a 55 year old who was found  to have a right colon cancer on routine colonoscopy who comes in for a  right colectomy.   OPERATION:  The patient was taken to the operating room, placed on the  table in the supine position.  After an adequate endotracheal anesthetic  was administered, she was prepped and draped in the usual sterile manner  exposing the midline.   We made a midline incision from approximately 5 cm above the umbilicus  down to below the umbilicus down to the pubic area.  We took it down to  and through the midline fascia using electrocautery.  We opened the  fascia fully and then used primarily Richardson retractors to gain  access to the right lower quadrant.   Upon palpating the right colon, a puckered hardened area with a tumor  was noted.  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  No evidence of  metastatic disease in the kidney.  The  gallbladder was flat and had no  stones and no evidence of disease throughout the colon.   We ran the small bowel also and found there to be no evidence of  disease.  We mobilized the right colon at the line of Toldt and rotated  it medially.  We mobilized the terminal ileum and the right colon at its  junction.  We rotated up above the hepatic flexure and then detached the  omentum from the right transverse colon.   Through the mesentery we did a transection of the right transverse colon  using a GIA 75 stapler.  We also detached the terminal ileum using the  same stapler.  The mesentery between the terminal ileum and the right  transverse colon was taken down using a LigaSure device.  This was  minimal.  We marked the terminal ileum with a short suture and the tumor  area with a long suture.   The specimen was sent off.  We irrigated with  saline, made sure the  bowel was not rotated on the mesentery.  We did a primary side-to-side  functional end-to-end anastomosis between the terminal ileum and the mid  transverse colon using a GIA 75 stapler.  The bowel was approximated  with 3-0 silk and then enterotomies made in the colon and the small  bowel and a GIA inserted.  This anastomosis was done and then the  resulting end anastomosis was closed off using a TA 60, 3.5 mm closure  stapler.  The mesentery was closed using interrupted 2-0 silk sutures.   We changed our gloves after the anastomosis and then irrigated with  saline solution.  Up to 3-4 L were used.  There was minimal bleeding.  No other biopsies were done and then we closed.   The fascia was reapproximate using a running #1 PDS suture with  intervening internal retention sutures of #1 Novofil.  Subcu was  irrigated with saline, then the skin was closed using stainless steel  staples.  All needle counts, sponge counts and instrument counts were  correct.      Gwenyth Ober, M.D.  Electronically Signed     JOW/MEDQ   D:  11/19/2006  T:  11/19/2006  Job:  [id number]   cc:   Royetta Crochet. Karlton Lemon, M.D.  Nelwyn Salisbury, M.D.

## 2011-03-26 ENCOUNTER — Encounter (HOSPITAL_BASED_OUTPATIENT_CLINIC_OR_DEPARTMENT_OTHER): Payer: BC Managed Care – PPO | Admitting: Oncology

## 2011-03-26 ENCOUNTER — Other Ambulatory Visit: Payer: Self-pay | Admitting: Oncology

## 2011-03-26 ENCOUNTER — Ambulatory Visit (HOSPITAL_COMMUNITY)
Admission: RE | Admit: 2011-03-26 | Discharge: 2011-03-26 | Disposition: A | Discharge status: Home / Self Care | Payer: BC Managed Care – PPO | Source: Ambulatory Visit | Attending: Oncology | Admitting: Oncology

## 2011-03-26 DIAGNOSIS — I2510 Atherosclerotic heart disease of native coronary artery without angina pectoris: Secondary | ICD-10-CM | POA: Insufficient documentation

## 2011-03-26 DIAGNOSIS — D252 Subserosal leiomyoma of uterus: Secondary | ICD-10-CM | POA: Insufficient documentation

## 2011-03-26 DIAGNOSIS — J984 Other disorders of lung: Secondary | ICD-10-CM | POA: Insufficient documentation

## 2011-03-26 DIAGNOSIS — C189 Malignant neoplasm of colon, unspecified: Secondary | ICD-10-CM | POA: Insufficient documentation

## 2011-03-26 DIAGNOSIS — Z9049 Acquired absence of other specified parts of digestive tract: Secondary | ICD-10-CM | POA: Insufficient documentation

## 2011-03-26 DIAGNOSIS — C772 Secondary and unspecified malignant neoplasm of intra-abdominal lymph nodes: Secondary | ICD-10-CM

## 2011-03-26 DIAGNOSIS — K5730 Diverticulosis of large intestine without perforation or abscess without bleeding: Secondary | ICD-10-CM | POA: Insufficient documentation

## 2011-03-26 LAB — CBC WITH DIFFERENTIAL/PLATELET
BASO%: 0.4 % (ref 0.0–2.0)
Basophils Absolute: 0 10*3/uL (ref 0.0–0.1)
EOS%: 1 % (ref 0.0–7.0)
Eosinophils Absolute: 0.1 10*3/uL (ref 0.0–0.5)
HCT: 30.1 % — ABNORMAL LOW (ref 34.8–46.6)
HGB: 10.5 g/dL — ABNORMAL LOW (ref 11.6–15.9)
LYMPH%: 20.8 % (ref 14.0–49.7)
MCH: 37.5 pg — ABNORMAL HIGH (ref 25.1–34.0)
MCHC: 35 g/dL (ref 31.5–36.0)
MCV: 107.4 fL — ABNORMAL HIGH (ref 79.5–101.0)
MONO#: 1 10*3/uL — ABNORMAL HIGH (ref 0.1–0.9)
MONO%: 12.7 % (ref 0.0–14.0)
NEUT#: 5.1 10*3/uL (ref 1.5–6.5)
NEUT%: 65.1 % (ref 38.4–76.8)
Platelets: 215 10*3/uL (ref 145–400)
RBC: 2.81 10*6/uL — ABNORMAL LOW (ref 3.70–5.45)
RDW: 13.1 % (ref 11.2–14.5)
WBC: 7.8 10*3/uL (ref 3.9–10.3)
lymph#: 1.6 10*3/uL (ref 0.9–3.3)

## 2011-03-26 LAB — CMP (CANCER CENTER ONLY)
ALT(SGPT): 20 U/L (ref 10–47)
AST: 18 U/L (ref 11–38)
Albumin: 3.5 g/dL (ref 3.3–5.5)
Alkaline Phosphatase: 55 U/L (ref 26–84)
BUN, Bld: 15 mg/dL (ref 7–22)
CO2: 25 mEq/L (ref 18–33)
Calcium: 9 mg/dL (ref 8.0–10.3)
Chloride: 100 mEq/L (ref 98–108)
Creat: 0.7 mg/dl (ref 0.6–1.2)
Glucose, Bld: 194 mg/dL — ABNORMAL HIGH (ref 73–118)
Potassium: 4.6 mEq/L (ref 3.3–4.7)
Sodium: 139 mEq/L (ref 128–145)
Total Bilirubin: 0.7 mg/dl (ref 0.20–1.60)
Total Protein: 7.5 g/dL (ref 6.4–8.1)

## 2011-03-26 LAB — CEA: CEA: 1.1 ng/mL (ref 0.0–5.0)

## 2011-03-26 MED ORDER — IOHEXOL 300 MG/ML  SOLN
125.0000 mL | Freq: Once | INTRAMUSCULAR | Status: AC | PRN
  Administered 2011-03-26: 125 mL via INTRAVENOUS

## 2011-03-28 ENCOUNTER — Encounter (HOSPITAL_BASED_OUTPATIENT_CLINIC_OR_DEPARTMENT_OTHER): Payer: BC Managed Care – PPO | Admitting: Oncology

## 2011-03-28 DIAGNOSIS — C182 Malignant neoplasm of ascending colon: Secondary | ICD-10-CM

## 2011-05-02 LAB — BASIC METABOLIC PANEL
BUN: 11
CO2: 29
Calcium: 9.6
Chloride: 105
Creatinine, Ser: 0.89
GFR calc Af Amer: >60
GFR calc non Af Amer: >60
Glucose, Bld: 106 — ABNORMAL HIGH
Potassium: 3.9
Sodium: 139

## 2011-05-02 LAB — POCT HEMOGLOBIN-HEMACUE
Hemoglobin: 10.9 — ABNORMAL LOW
Operator id: 123881

## 2012-02-12 ENCOUNTER — Telehealth: Payer: Self-pay | Admitting: Oncology

## 2012-02-12 NOTE — Telephone Encounter (Signed)
Returned pt's call and lmonvm for pt re appt for 9/17 and mailed schedule.

## 2012-03-31 ENCOUNTER — Other Ambulatory Visit: Payer: BC Managed Care – PPO | Admitting: Lab

## 2012-03-31 ENCOUNTER — Other Ambulatory Visit: Payer: Self-pay | Admitting: Oncology

## 2012-03-31 ENCOUNTER — Ambulatory Visit (HOSPITAL_BASED_OUTPATIENT_CLINIC_OR_DEPARTMENT_OTHER): Payer: BC Managed Care – PPO | Admitting: Oncology

## 2012-03-31 VITALS — BP 165/74 | HR 82 | Temp 97.0°F | Resp 20 | Wt 297.7 lb

## 2012-03-31 DIAGNOSIS — C189 Malignant neoplasm of colon, unspecified: Secondary | ICD-10-CM

## 2012-03-31 DIAGNOSIS — D649 Anemia, unspecified: Secondary | ICD-10-CM

## 2012-03-31 DIAGNOSIS — Z85038 Personal history of other malignant neoplasm of large intestine: Secondary | ICD-10-CM

## 2012-03-31 LAB — COMPREHENSIVE METABOLIC PANEL (CC13)
ALT: 27 U/L (ref 0–55)
AST: 25 U/L (ref 5–34)
Albumin: 4.1 g/dL (ref 3.5–5.0)
Alkaline Phosphatase: 68 U/L (ref 40–150)
BUN: 26 mg/dL (ref 7.0–26.0)
CO2: 30 mEq/L — ABNORMAL HIGH (ref 22–29)
Calcium: 10.2 mg/dL (ref 8.4–10.4)
Chloride: 96 mEq/L — ABNORMAL LOW (ref 98–107)
Creatinine: 1.4 mg/dL — ABNORMAL HIGH (ref 0.6–1.1)
Glucose: 291 mg/dl — ABNORMAL HIGH (ref 70–99)
Potassium: 3.7 mEq/L (ref 3.5–5.1)
Sodium: 140 mEq/L (ref 136–145)
Total Bilirubin: 0.8 mg/dL (ref 0.20–1.20)
Total Protein: 7.7 g/dL (ref 6.4–8.3)

## 2012-03-31 LAB — CBC WITH DIFFERENTIAL/PLATELET
BASO%: 0.5 % (ref 0.0–2.0)
Basophils Absolute: 0 10*3/uL (ref 0.0–0.1)
EOS%: 0.5 % (ref 0.0–7.0)
Eosinophils Absolute: 0 10*3/uL (ref 0.0–0.5)
HCT: 37 % (ref 34.8–46.6)
HGB: 12.9 g/dL (ref 11.6–15.9)
LYMPH%: 15.4 % (ref 14.0–49.7)
MCH: 39.9 pg — ABNORMAL HIGH (ref 25.1–34.0)
MCHC: 34.8 g/dL (ref 31.5–36.0)
MCV: 114.6 fL — ABNORMAL HIGH (ref 79.5–101.0)
MONO#: 0.9 10*3/uL (ref 0.1–0.9)
MONO%: 13.4 % (ref 0.0–14.0)
NEUT#: 4.9 10*3/uL (ref 1.5–6.5)
NEUT%: 70.2 % (ref 38.4–76.8)
Platelets: 206 10*3/uL (ref 145–400)
RBC: 3.23 10*6/uL — ABNORMAL LOW (ref 3.70–5.45)
RDW: 13.2 % (ref 11.2–14.5)
WBC: 7 10*3/uL (ref 3.9–10.3)
lymph#: 1.1 10*3/uL (ref 0.9–3.3)

## 2012-03-31 LAB — CEA: CEA: 0.9 ng/mL (ref 0.0–5.0)

## 2012-03-31 NOTE — Progress Notes (Signed)
Hematology and Oncology Follow Up Visit  Patricia Mata [id number] [birth year] 56 y.o. 03/31/2012 3:38 PM   Principle Diagnosis: 57 year old with T3 N1 colon cancer in 10/2006.   Prior Therapy: She is S/P hemicolectomy done in 11/2006. 1/13 lymph nodes are positive. (T3N1). She is S/P 12 cycles of FOLFOX completed in 05/2007  Current therapy: Observation and follow up   Interim History:  Patricia Mata return today for a follow up visit. She has been doing very well. No GI symptoms., bo bleeding noted.  She works regularly. She lost 19 lbs since her last visit. She continue to have normal life and excellent performance statues.   Medications: I have reviewed the patient's current medications. No current outpatient prescriptions on file.  Allergies: Allergies not on file  Past Medical History, Surgical history, Social history, and Family History were reviewed and updated.  Review of Systems: Constitutional:  Negative for fever, chills, night sweats, anorexia, weight loss, pain. Cardiovascular: negative Respiratory: negative Neurological: negative Dermatological: negative ENT: negative Skin: Negative. Gastrointestinal: negative Genito-Urinary: negative Hematological and Lymphatic: negative Breast: negative Musculoskeletal: negative Remaining ROS negative. Physical Exam: Blood pressure 165/74, pulse 82, temperature 97 F (36.1 C), temperature source Oral, resp. rate 20, weight 297 lb 11.2 oz (135.036 kg). ECOG: 0 General appearance: alert Head: Normocephalic, without obvious abnormality, atraumatic Neck: no adenopathy, no carotid bruit, no JVD, supple, symmetrical, trachea midline and thyroid not enlarged, symmetric, no tenderness/mass/nodules Lymph nodes: Cervical, supraclavicular, and axillary nodes normal. Heart:regular rate and rhythm, S1, S2 normal, no murmur, click, rub or gallop Lung:chest clear, no wheezing, rales, normal symmetric air entry Abdomin: soft, non-tender,  without masses or organomegaly EXT:no erythema, induration, or nodules   Lab Results: Lab Results  Component Value Date   WBC 7.0 03/31/2012   HGB 12.9 03/31/2012   HCT 37.0 03/31/2012   MCV 114.6* 03/31/2012   PLT 206 03/31/2012     Chemistry      Component Value Date/Time   NA 139 03/26/2011 0840   NA 140 09/21/2010 1526   K 4.6 03/26/2011 0840   K 4.0 09/21/2010 1526   CL 100 03/26/2011 0840   CL 104 09/21/2010 1526   CO2 25 03/26/2011 0840   CO2 29 09/21/2010 1526   BUN 15 03/26/2011 0840   BUN 15 09/21/2010 1526   CREATININE 0.7 03/26/2011 0840   CREATININE 0.79 09/21/2010 1526      Component Value Date/Time   CALCIUM 9.0 03/26/2011 0840   CALCIUM 9.5 09/21/2010 1526   ALKPHOS 55 03/26/2011 0840   ALKPHOS 64 09/21/2010 1526   AST 18 03/26/2011 0840   AST 34 09/21/2010 1526   ALT 23 09/21/2010 1526   BILITOT 0.70 03/26/2011 0840   BILITOT 0.8 09/21/2010 1526       Impression and Plan:  57 year old female with the follow ing issues:  1. Stage III (T3N1) colon cancer. She is 5 years our from the completion of her 6 months of adjuvant therapy. She is up to speed on her colonoscopies.  At this time, no further follow up is needed, She will follow up as needed. I continued to encourage her age appropriate cancer screening.   2. Hypertension: better during this visit. She is following this with her PCP.    Zola Button, MD 9/17/20133:38 PM

## 2014-11-11 ENCOUNTER — Telehealth: Payer: Self-pay | Admitting: Oncology

## 2014-11-11 NOTE — Telephone Encounter (Signed)
NEW PATIENT REFERRAL-LEFT MESSAGE FOR PATIENT TO RETURN CALL

## 2015-11-20 ENCOUNTER — Encounter: Payer: Self-pay | Admitting: Hematology and Oncology

## 2015-11-20 ENCOUNTER — Telehealth: Payer: Self-pay | Admitting: Hematology and Oncology

## 2015-11-20 NOTE — Telephone Encounter (Signed)
Verified address and insurance, faxed referring provider date/time of appt,, mailed new pt packet, schedule intake

## 2015-11-29 ENCOUNTER — Telehealth: Payer: Self-pay | Admitting: *Deleted

## 2015-11-29 ENCOUNTER — Other Ambulatory Visit: Payer: Self-pay | Admitting: *Deleted

## 2015-11-29 ENCOUNTER — Ambulatory Visit (HOSPITAL_BASED_OUTPATIENT_CLINIC_OR_DEPARTMENT_OTHER): Payer: BC Managed Care – PPO | Admitting: Oncology

## 2015-11-29 ENCOUNTER — Telehealth: Payer: Self-pay | Admitting: Oncology

## 2015-11-29 ENCOUNTER — Ambulatory Visit (HOSPITAL_BASED_OUTPATIENT_CLINIC_OR_DEPARTMENT_OTHER): Payer: BC Managed Care – PPO

## 2015-11-29 ENCOUNTER — Encounter: Payer: Self-pay | Admitting: *Deleted

## 2015-11-29 VITALS — BP 170/54 | HR 88 | Temp 98.2°F | Resp 18 | Wt 234.9 lb

## 2015-11-29 DIAGNOSIS — D649 Anemia, unspecified: Secondary | ICD-10-CM | POA: Diagnosis not present

## 2015-11-29 DIAGNOSIS — D539 Nutritional anemia, unspecified: Secondary | ICD-10-CM | POA: Diagnosis not present

## 2015-11-29 DIAGNOSIS — Z85038 Personal history of other malignant neoplasm of large intestine: Secondary | ICD-10-CM | POA: Diagnosis not present

## 2015-11-29 LAB — CBC WITH DIFFERENTIAL/PLATELET
BASO%: 0.5 % (ref 0.0–2.0)
Basophils Absolute: 0 10*3/uL (ref 0.0–0.1)
EOS%: 1.1 % (ref 0.0–7.0)
Eosinophils Absolute: 0.1 10*3/uL (ref 0.0–0.5)
HCT: 21.4 % — ABNORMAL LOW (ref 34.8–46.6)
HGB: 7.3 g/dL — ABNORMAL LOW (ref 11.6–15.9)
LYMPH%: 13.1 % — ABNORMAL LOW (ref 14.0–49.7)
MCH: 39.6 pg — ABNORMAL HIGH (ref 25.1–34.0)
MCHC: 34.1 g/dL (ref 31.5–36.0)
MCV: 116 fL — ABNORMAL HIGH (ref 79.5–101.0)
MONO#: 0.8 10*3/uL (ref 0.1–0.9)
MONO%: 14.9 % — ABNORMAL HIGH (ref 0.0–14.0)
NEUT#: 3.7 10*3/uL (ref 1.5–6.5)
NEUT%: 70.4 % (ref 38.4–76.8)
Platelets: 336 10*3/uL (ref 145–400)
RBC: 1.85 10*6/uL — ABNORMAL LOW (ref 3.70–5.45)
RDW: 15.3 % — ABNORMAL HIGH (ref 11.2–14.5)
WBC: 5.2 10*3/uL (ref 3.9–10.3)
lymph#: 0.7 10*3/uL — ABNORMAL LOW (ref 0.9–3.3)

## 2015-11-29 LAB — COMPREHENSIVE METABOLIC PANEL
ALT: 28 U/L (ref 0–55)
AST: 20 U/L (ref 5–34)
Albumin: 4 g/dL (ref 3.5–5.0)
Alkaline Phosphatase: 53 U/L (ref 40–150)
Anion Gap: 8 mEq/L (ref 3–11)
BUN: 19.1 mg/dL (ref 7.0–26.0)
CO2: 25 mEq/L (ref 22–29)
Calcium: 9.7 mg/dL (ref 8.4–10.4)
Chloride: 106 mEq/L (ref 98–109)
Creatinine: 1.1 mg/dL (ref 0.6–1.1)
EGFR: 66 mL/min/{1.73_m2} — ABNORMAL LOW (ref >90)
Glucose: 236 mg/dl — ABNORMAL HIGH (ref 70–140)
Potassium: 4.2 mEq/L (ref 3.5–5.1)
Sodium: 139 mEq/L (ref 136–145)
Total Bilirubin: 0.99 mg/dL (ref 0.20–1.20)
Total Protein: 7.1 g/dL (ref 6.4–8.3)

## 2015-11-29 LAB — IRON AND TIBC
%SAT: 35 % (ref 21–57)
Iron: 87 ug/dL (ref 41–142)
TIBC: 250 ug/dL (ref 236–444)
UIBC: 163 ug/dL (ref 120–384)

## 2015-11-29 LAB — TECHNOLOGIST REVIEW

## 2015-11-29 LAB — FERRITIN: Ferritin: 375 ng/ml — ABNORMAL HIGH (ref 9–269)

## 2015-11-29 LAB — CHCC SMEAR

## 2015-11-29 NOTE — Telephone Encounter (Signed)
Lm on answering machine to call me [phone] with dr Alen Blew today @ 10:30

## 2015-11-29 NOTE — Telephone Encounter (Signed)
Called patient at work, she will see dr Alen Blew @ 10:30 today.

## 2015-11-29 NOTE — Progress Notes (Signed)
Hematology and Oncology Follow Up Visit  Patricia Mata [id number] 01/09/1956 60 y.o. 11/29/2015 10:55 AM   Principle Diagnosis: 60 year old with:  1. T3 N1 colon cancer in 10/2006. She had been in remission since the completion of therapy in 2008. 2. Macrocytic anemia etiology is unclear workup is ongoing.   Prior Therapy: She is S/P hemicolectomy done in 11/2006. 1/13 lymph nodes are positive. (T3N1). She is S/P 12 cycles of FOLFOX completed in 05/2007  Current therapy: Observation and follow up   Interim History:  Patricia Mata return today for a follow up visit. She is a pleasant woman that I see in last in 2013 since the conclusion of her colon cancer treatment. She has been following up with her primary care provider and she noticed decrease in her hemoglobin close to the 8 range. She had an elevated MCV of 108. Her iron studies, B12 levels as well as chemistries have been within normal range. She denied any bleeding issues such as hemoptysis or hematemesis. She denied any hematochezia or melena. She continues to be active and performs activities of daily living. She works 2 jobs and have not had any fatigue or tiredness.  She is not report any headaches blurry vision, syncope or seizures. She does not report any fevers, chills sweats or weight loss. He does not report any chest pain, palpitation orthopnea or leg edema. She does not report any cough, wheezing or hemoptysis. She does not report any nausea, vomiting or abdominal pain. She does not report any frequency urgency or hesitancy. She does not report any skeletal complaints. Remaining review of systems unremarkable.  Medications: I have reviewed the patient's current medications.  Allergies: Allergies not on file   Physical Exam: Blood pressure 170/54, pulse 88, temperature 98.2 F (36.8 C), temperature source Oral, resp. rate 18, weight 234 lb 14.4 oz (106.55 kg), SpO2 100 %. ECOG: 0 General appearance: Alert, awake woman without  distress. Head: Normocephalic, without obvious abnormality Neck: no adenopathy Lymph nodes: Cervical, supraclavicular, and axillary nodes normal. Heart:regular rate and rhythm, S1, S2 normal, no murmur, click, rub or gallop Lung:chest clear, no wheezing, rales, normal symmetric air entry Abdomin: soft, non-tender, without masses or organomegaly no shifting dullness or ascites. EXT:no erythema, induration, or nodules   Lab Results: Lab Results  Component Value Date   WBC 7.0 03/31/2012   HGB 12.9 03/31/2012   HCT 37.0 03/31/2012   MCV 114.6* 03/31/2012   PLT 206 03/31/2012     Chemistry      Component Value Date/Time   NA 140 03/31/2012 1513   NA 139 03/26/2011 0840   NA 140 09/21/2010 1526   K 3.7 03/31/2012 1513   K 4.6 03/26/2011 0840   K 4.0 09/21/2010 1526   CL 96* 03/31/2012 1513   CL 100 03/26/2011 0840   CL 104 09/21/2010 1526   CO2 30* 03/31/2012 1513   CO2 25 03/26/2011 0840   CO2 29 09/21/2010 1526   BUN 26.0 03/31/2012 1513   BUN 15 03/26/2011 0840   BUN 15 09/21/2010 1526   CREATININE 1.4* 03/31/2012 1513   CREATININE 0.7 03/26/2011 0840   CREATININE 0.79 09/21/2010 1526      Component Value Date/Time   CALCIUM 10.2 03/31/2012 1513   CALCIUM 9.0 03/26/2011 0840   CALCIUM 9.5 09/21/2010 1526   ALKPHOS 68 03/31/2012 1513   ALKPHOS 55 03/26/2011 0840   ALKPHOS 64 09/21/2010 1526   AST 25 03/31/2012 1513   AST 18 03/26/2011 0840  AST 34 09/21/2010 1526   ALT 27 03/31/2012 1513   ALT 20 03/26/2011 0840   ALT 23 09/21/2010 1526   BILITOT 0.80 03/31/2012 1513   BILITOT 0.70 03/26/2011 0840   BILITOT 0.8 09/21/2010 1526       Impression and Plan:  60 year old female with the following issues:  1. Stage III (T3N1) colon cancer. She is 9 years out from the completion of therapy without any evidence to suggest recurrent disease.  2. Macrocytic anemia: Her MCV have been elevated for many years with a hemoglobin of ranged between 10 and 12. Her  hemoglobin as of late have drifted down close to 7.5 although she is completely asymptomatic. The differential diagnosis was discussed today with her including vitamin deficiency such as folic acid, plasma cell disorder or myelodysplasia. The plan is to repeat her laboratory testing including a CBC, peripheral smear as well as serum protein electrophoresis and erythropoietin level. If these findings do not give [place] a clear-cut diagnosis, she might require a bone marrow biopsy. I discussed with her this possibility today and possible need for growth factor support if we're dealing with anemia of chronic disease although erythropoietin anemia.  She will have blood work obtained today and she will return in a couple weeks to discuss these results.    [facility], MD 5/17/201710:55 AM

## 2015-11-29 NOTE — Telephone Encounter (Signed)
per of to sch pt appt-gave pt copy of avs °

## 2015-11-30 ENCOUNTER — Ambulatory Visit: Payer: BC Managed Care – PPO | Admitting: Hematology and Oncology

## 2015-11-30 LAB — ERYTHROPOIETIN: Erythropoietin: 49.4 m[IU]/mL — ABNORMAL HIGH (ref 2.6–18.5)

## 2015-11-30 LAB — FOLATE: Folate: >20 ng/mL (ref >3.0)

## 2015-11-30 LAB — VITAMIN B12: Vitamin B12: 1066 pg/mL — ABNORMAL HIGH (ref 211–946)

## 2015-12-01 LAB — MULTIPLE MYELOMA PANEL, SERUM
Albumin SerPl Elph-Mcnc: 4.2 g/dL (ref 2.9–4.4)
Albumin/Glob SerPl: 1.7 (ref 0.7–1.7)
Alpha 1: 0.2 g/dL (ref 0.0–0.4)
Alpha2 Glob SerPl Elph-Mcnc: 0.5 g/dL (ref 0.4–1.0)
B-Globulin SerPl Elph-Mcnc: 0.9 g/dL (ref 0.7–1.3)
Gamma Glob SerPl Elph-Mcnc: 1.1 g/dL (ref 0.4–1.8)
Globulin, Total: 2.6 g/dL (ref 2.2–3.9)
IgA, Qn, Serum: 238 mg/dL (ref 87–352)
IgG, Qn, Serum: 1093 mg/dL (ref 700–1600)
IgM, Qn, Serum: 55 mg/dL (ref 26–217)
Total Protein: 6.8 g/dL (ref 6.0–8.5)

## 2015-12-20 ENCOUNTER — Telehealth: Payer: Self-pay | Admitting: Oncology

## 2015-12-20 ENCOUNTER — Ambulatory Visit (HOSPITAL_BASED_OUTPATIENT_CLINIC_OR_DEPARTMENT_OTHER): Payer: BC Managed Care – PPO | Admitting: Oncology

## 2015-12-20 VITALS — BP 181/55 | HR 83 | Temp 99.3°F | Resp 18 | Ht 63.0 in | Wt 238.5 lb

## 2015-12-20 DIAGNOSIS — Z9221 Personal history of antineoplastic chemotherapy: Secondary | ICD-10-CM

## 2015-12-20 DIAGNOSIS — Z85038 Personal history of other malignant neoplasm of large intestine: Secondary | ICD-10-CM | POA: Diagnosis not present

## 2015-12-20 DIAGNOSIS — D649 Anemia, unspecified: Secondary | ICD-10-CM

## 2015-12-20 NOTE — Addendum Note (Signed)
Addended by: Randolm Idol on: 12/20/2015 10:25 AM   Modules accepted: Medications

## 2015-12-20 NOTE — Progress Notes (Signed)
Hematology and Oncology Follow Up Visit  Patricia Mata [id number] 04/25/1956 60 y.o. 12/20/2015 9:39 AM   Principle Diagnosis: 60 year old with:  1. T3 N1 colon cancer in 10/2006. She had been in remission since the completion of therapy in 2008. 2. Macrocytic anemia etiology is unclear workup is ongoing.   Prior Therapy: She is S/P hemicolectomy done in 11/2006. 1/13 lymph nodes are positive. (T3N1). She is S/P 12 cycles of FOLFOX completed in 05/2007  Current therapy: Observation and follow up   Interim History:  Patricia Mata return today for a follow up visit. Since the last visit, she continues to be completely asymptomatic. She denied any fatigue or tiredness. She continues to be active without any decline. She denied any bleeding issues such as hemoptysis or hematemesis. She denied any hematochezia or melena. She continues to be active and performs activities of daily living. She works 2 jobs without any changes in her ability to do so.  She is not report any headaches blurry vision, syncope or seizures. She does not report any fevers, chills sweats or weight loss. He does not report any chest pain, palpitation orthopnea or leg edema. She does not report any cough, wheezing or hemoptysis. She does not report any nausea, vomiting or abdominal pain. She does not report any frequency urgency or hesitancy. She does not report any skeletal complaints. Remaining review of systems unremarkable.  Medications: I have reviewed the patient's current medications.   Allergies: Not on File   Physical Exam: Blood pressure 181/55, pulse 83, temperature 99.3 F (37.4 C), temperature source Oral, resp. rate 18, height _0  (1.6 m), weight 238 lb 8 oz (108.183 kg), SpO2 97 %. ECOG: 0 General appearance: Alert, awake woman without distress. Head: Normocephalic, without obvious abnormality Neck: no adenopathy Lymph nodes: Cervical, supraclavicular, and axillary nodes normal. Heart:regular rate and  rhythm, S1, S2 normal, no murmur, click, rub or gallop Lung:chest clear, no wheezing, rales, normal symmetric air entry Abdomin: soft, non-tender, without masses or organomegaly no shifting dullness or ascites. EXT:no erythema, induration, or nodules   Lab Results: Lab Results  Component Value Date   WBC 5.2 11/29/2015   HGB 7.3* 11/29/2015   HCT 21.4* 11/29/2015   MCV 116.0* 11/29/2015   PLT 336 11/29/2015     Chemistry      Component Value Date/Time   NA 139 11/29/2015 1116   NA 139 03/26/2011 0840   NA 140 09/21/2010 1526   K 4.2 11/29/2015 1116   K 4.6 03/26/2011 0840   K 4.0 09/21/2010 1526   CL 96* 03/31/2012 1513   CL 100 03/26/2011 0840   CL 104 09/21/2010 1526   CO2 25 11/29/2015 1116   CO2 25 03/26/2011 0840   CO2 29 09/21/2010 1526   BUN 19.1 11/29/2015 1116   BUN 15 03/26/2011 0840   BUN 15 09/21/2010 1526   CREATININE 1.1 11/29/2015 1116   CREATININE 0.7 03/26/2011 0840   CREATININE 0.79 09/21/2010 1526      Component Value Date/Time   CALCIUM 9.7 11/29/2015 1116   CALCIUM 9.0 03/26/2011 0840   CALCIUM 9.5 09/21/2010 1526   ALKPHOS 53 11/29/2015 1116   ALKPHOS 55 03/26/2011 0840   ALKPHOS 64 09/21/2010 1526   AST 20 11/29/2015 1116   AST 18 03/26/2011 0840   AST 34 09/21/2010 1526   ALT 28 11/29/2015 1116   ALT 20 03/26/2011 0840   ALT 23 09/21/2010 1526   BILITOT 0.99 11/29/2015 1116   BILITOT  0.70 03/26/2011 0840   BILITOT 0.8 09/21/2010 1526     Results for Patricia Mata (MRN [id number]) as of 12/20/2015 07:51  Ref. Range 11/29/2015 11:15  M Protein SerPl Elph-Mcnc Latest Ref Range: Not Observed g/dL Not Observed  Results for Patricia Mata (MRN [id number]) as of 12/20/2015 07:51  Ref. Range 11/29/2015 11:15  Vitamin B12 Latest Ref Range: 211-946 pg/mL 1,066 (H)    Impression and Plan:  60 year old female with the following issues:  1. Stage III (T3N1) colon cancer. She is 9 years out from the completion of therapy without any evidence to  suggest recurrent disease.  2. Macrocytic anemia: Her MCV have been elevated for many years with a hemoglobin of ranged between 10 and 12. Her hemoglobin as of late have drifted down close to 7.3 although she is completely asymptomatic.   Her workup obtained on 11/29/2015 was reviewed today and showed no clear-cut reason for her microcytosis. She had a normal serum protein electrophoresis, G90 and folic acid. She had normal kidney function and liver function test. Her peripheral smear did show teardrop cells and ovalocytes.  The differential diagnosis was discussed again and likely were dealing with myelodysplastic syndrome versus myelofibrosis. The only way to distinguish these findings is with a bone marrow biopsy.  Risks and benefits of this procedure were reviewed today. Complications include pain, bleeding and rarely infection have been associated with this procedure. Given her body habitus, CT-guided imaging will be needed at this time. I will refer her to interventional radiology to have that done to his individual June based on her preference and she is off work. Management will be dependent on her diagnosis. She is asymptomatic at this time and does not require any transfusion.  3. Follow-up: Will be after the results of her bone marrow biopsy scheduled for late June or early July.    [facility], MD 6/7/20179:39 AM

## 2015-12-20 NOTE — Telephone Encounter (Signed)
Gave and printed appt sched and avs for pt for july

## 2016-01-18 ENCOUNTER — Other Ambulatory Visit (HOSPITAL_COMMUNITY): Payer: BC Managed Care – PPO

## 2016-01-18 ENCOUNTER — Ambulatory Visit (HOSPITAL_COMMUNITY): Payer: BC Managed Care – PPO

## 2016-01-24 ENCOUNTER — Ambulatory Visit: Payer: BC Managed Care – PPO | Admitting: Oncology

## 2016-01-30 ENCOUNTER — Telehealth: Payer: Self-pay | Admitting: *Deleted

## 2016-01-30 NOTE — Telephone Encounter (Signed)
"  I'm returning a call I received yesterday about an appointment with Dr. Alen Blew after hip biopsy."  No call documentation indicating who called patient.  Transferred call to scheduling extension [phone] for provider's assigned scheduler at this time.

## 2016-01-31 ENCOUNTER — Telehealth: Payer: Self-pay | Admitting: Oncology

## 2016-01-31 ENCOUNTER — Other Ambulatory Visit: Payer: Self-pay | Admitting: Radiology

## 2016-01-31 NOTE — Telephone Encounter (Signed)
Per staff message response from FS he will see patient for f/u 7/28 @ 12:15 pm. Left message for patient and mailed schedule. F/u rescheduled per patient due to bx not until 7/20.

## 2016-02-01 ENCOUNTER — Ambulatory Visit (HOSPITAL_COMMUNITY)
Admission: RE | Admit: 2016-02-01 | Discharge: 2016-02-01 | Disposition: A | Discharge status: Home / Self Care | Payer: BC Managed Care – PPO | Source: Ambulatory Visit | Attending: Oncology | Admitting: Oncology

## 2016-02-01 ENCOUNTER — Encounter (HOSPITAL_COMMUNITY): Payer: Self-pay

## 2016-02-01 DIAGNOSIS — D539 Nutritional anemia, unspecified: Secondary | ICD-10-CM | POA: Diagnosis not present

## 2016-02-01 DIAGNOSIS — D649 Anemia, unspecified: Secondary | ICD-10-CM | POA: Insufficient documentation

## 2016-02-01 HISTORY — DX: Nutritional anemia, unspecified: D53.9

## 2016-02-01 HISTORY — DX: Type 2 diabetes mellitus without complications: E11.9

## 2016-02-01 HISTORY — DX: Essential (primary) hypertension: I10

## 2016-02-01 LAB — CBC WITH DIFFERENTIAL/PLATELET
Basophils Absolute: 0 10*3/uL (ref 0.0–0.1)
Basophils Relative: 0 %
Eosinophils Absolute: 0 10*3/uL (ref 0.0–0.7)
Eosinophils Relative: 1 %
HCT: 21.5 % — ABNORMAL LOW (ref 36.0–46.0)
Hemoglobin: 7.5 g/dL — ABNORMAL LOW (ref 12.0–15.0)
Lymphocytes Relative: 19 %
Lymphs Abs: 0.9 10*3/uL (ref 0.7–4.0)
MCH: 40.5 pg — ABNORMAL HIGH (ref 26.0–34.0)
MCHC: 34.9 g/dL (ref 30.0–36.0)
MCV: 116.2 fL — ABNORMAL HIGH (ref 78.0–100.0)
Monocytes Absolute: 0.8 10*3/uL (ref 0.1–1.0)
Monocytes Relative: 18 %
Neutro Abs: 2.8 10*3/uL (ref 1.7–7.7)
Neutrophils Relative %: 62 %
Platelets: 377 10*3/uL (ref 150–400)
RBC: 1.85 MIL/uL — ABNORMAL LOW (ref 3.87–5.11)
RDW: 15.2 % (ref 11.5–15.5)
WBC: 4.5 10*3/uL (ref 4.0–10.5)

## 2016-02-01 LAB — GLUCOSE, CAPILLARY: Glucose-Capillary: 191 mg/dL — ABNORMAL HIGH (ref 65–99)

## 2016-02-01 LAB — PROTIME-INR
INR: 1.19 (ref 0.00–1.49)
Prothrombin Time: 15.3 seconds — ABNORMAL HIGH (ref 11.6–15.2)

## 2016-02-01 LAB — APTT: aPTT: 27 seconds (ref 24–37)

## 2016-02-01 LAB — BONE MARROW EXAM

## 2016-02-01 MED ORDER — FENTANYL CITRATE (PF) 100 MCG/2ML IJ SOLN
INTRAMUSCULAR | Status: AC
  Filled 2016-02-01: qty 4

## 2016-02-01 MED ORDER — MIDAZOLAM HCL 2 MG/2ML IJ SOLN
INTRAMUSCULAR | Status: AC | PRN
  Administered 2016-02-01 (×4): 1 mg via INTRAVENOUS

## 2016-02-01 MED ORDER — SODIUM CHLORIDE 0.9 % IV SOLN
INTRAVENOUS | Status: DC
  Administered 2016-02-01: 08:00:00 via INTRAVENOUS

## 2016-02-01 MED ORDER — FENTANYL CITRATE (PF) 100 MCG/2ML IJ SOLN
INTRAMUSCULAR | Status: AC | PRN
  Administered 2016-02-01 (×2): 25 ug via INTRAVENOUS
  Administered 2016-02-01: 50 ug via INTRAVENOUS

## 2016-02-01 MED ORDER — MIDAZOLAM HCL 2 MG/2ML IJ SOLN
INTRAMUSCULAR | Status: AC
  Filled 2016-02-01: qty 6

## 2016-02-01 NOTE — Procedures (Signed)
R iliac BM aspirate and core. No comp/EBL

## 2016-02-01 NOTE — H&P (Signed)
Chief Complaint: Patient was seen in consultation today for bone marrow biopsy at the request of Shadad,Firas N  Referring Physician(s): [name]  Supervising Physician: Marybelle Killings  Patient Status: Outpatient  History of Present Illness: Patricia Mata is a 60 y.o. female being worked up for anemia. She is referred for bone marrow biopsy. PMHx, meds, labs, allergies reviewed. Has been NPO this am.  Past Medical History  Diagnosis Date  . Hypertension   . Diabetes mellitus without complication ([place])   . Macrocytic anemia     Past Surgical History  Procedure Laterality Date  . Colon surgery      Allergies: Sulfa antibiotics  Medications: Prior to Admission medications   Medication Sig Start Date End Date Taking? Authorizing Provider  atorvastatin (LIPITOR) 40 MG tablet Take 40 mg by mouth daily.   Yes Historical Provider, MD  colchicine 0.6 MG tablet Take 1 tablet by mouth 2 (two) times daily. 11/02/15  Yes Historical Provider, MD  EDARBYCLOR 40-12.5 MG TABS Take 1 tablet by mouth daily. 11/22/15  Yes Historical Provider, MD  glimepiride (AMARYL) 2 MG tablet Take 2 mg by mouth 2 (two) times daily. 10/26/15  Yes Historical Provider, MD  hydrALAZINE (APRESOLINE) 25 MG tablet TAKE 2 TABLETS BY MOUTH TWICE A DAY WITH FOOD 10/26/15  Yes Historical Provider, MD  latanoprost (XALATAN) 0.005 % ophthalmic solution 1 drop at bedtime.   Yes Historical Provider, MD  metoprolol (LOPRESSOR) 50 MG tablet Take 1 tablet by mouth daily. 11/28/15  Yes Historical Provider, MD  sitaGLIPtin-metformin (JANUMET) 50-1000 MG tablet Take 1 tablet by mouth 2 (two) times daily with a meal.   Yes Historical Provider, MD     History reviewed. No pertinent family history.  Social History   Social History  . Marital Status: Married    Spouse Name: N/A  . Number of Children: N/A  . Years of Education: N/A   Social History Main Topics  . Smoking status: Former Smoker    Types: Cigarettes    Quit date: 01/31/2001  . Smokeless tobacco: None  . Alcohol Use: 1.2 oz/week    2 Shots of liquor per week  . Drug Use: None  . Sexual Activity: Not Asked   Other Topics Concern  . None   Social History Narrative  . None    Review of Systems: A 12 point ROS discussed and pertinent positives are indicated in the HPI above.  All other systems are negative.  Review of Systems  Vital Signs: BP 168/50 mmHg  Pulse 73  Temp(Src) 98.1 F (36.7 C) (Oral)  Resp 18  Ht '5\' 4"'  (1.626 m)  Wt 230 lb (104.327 kg)  BMI 39.46 kg/m2  SpO2 99%  Physical Exam  Constitutional: She is oriented to person, place, and time. She appears well-developed and well-nourished. No distress.  HENT:  Head: Normocephalic.  Mouth/Throat: Oropharynx is clear and moist.  Neck: No tracheal deviation present.  Cardiovascular: Normal rate, regular rhythm and normal heart sounds.   Pulmonary/Chest: Effort normal and breath sounds normal. No respiratory distress.  Neurological: She is alert and oriented to person, place, and time.  Skin: Skin is warm and dry.  Psychiatric: She has a normal mood and affect. Judgment normal.    Mallampati Score:  MD Evaluation Airway: WNL Heart: WNL Abdomen: WNL Chest/ Lungs: WNL ASA  Classification: 2 Mallampati/Airway Score: One  Imaging: No results found.  Labs:  CBC:  Recent Labs  11/29/15 1115 02/01/16 0715  WBC 5.2  4.5  HGB 7.3* 7.5*  HCT 21.4* 21.5*  PLT 336 377    COAGS:  Recent Labs  02/01/16 0715  INR 1.19  APTT 27    BMP:  Recent Labs  11/29/15 1116  NA 139  K 4.2  CO2 25  GLUCOSE 236*  BUN 19.1  CALCIUM 9.7  CREATININE 1.1    LIVER FUNCTION TESTS:  Recent Labs  11/29/15 1115 11/29/15 1116  BILITOT  --  0.99  AST  --  20  ALT  --  28  ALKPHOS  --  53  PROT 6.8 7.1  ALBUMIN  --  4.0    TUMOR MARKERS: No results for input(s): AFPTM, CEA, CA199, CHROMGRNA in the last 8760 hours.  Assessment and Plan: Anemia For  CT guided bone marrow biopsy Labs reviewed. Risks and Benefits discussed with the patient including, but not limited to bleeding, infection, damage to adjacent structures or low yield requiring additional tests. All of the patient's questions were answered, patient is agreeable to proceed. Consent signed and in chart.    Thank you for this interesting consult.  I greatly enjoyed meeting Patricia Mata and look forward to participating in their care.  A copy of this report was sent to the requesting provider on this date.  Electronically Signed: Ascencion Dike 02/01/2016, 8:42 AM   I spent a total of 20 minutes in face to face in clinical consultation, greater than 50% of which was counseling/coordinating care for bone marrow biopsy

## 2016-02-01 NOTE — Telephone Encounter (Signed)
Biopsy scheduled 02-01-2016 with F/U 02-09-2016

## 2016-02-01 NOTE — Discharge Instructions (Signed)
Bone Marrow Aspiration and Bone Marrow Biopsy °Bone marrow aspiration and bone marrow biopsy are procedures that are done to diagnose blood disorders. You may also have one of these procedures to help diagnose infections or some types of cancer. °Bone marrow is the soft tissue that is inside your bones. Blood cells are produced in bone marrow. For bone marrow aspiration, a sample of tissue in liquid form is removed from inside your bone. For a bone marrow biopsy, a small core of bone marrow tissue is removed. Then these samples are examined under a microscope or tested in a lab. °You may need these procedures if you have an abnormal complete blood count (CBC). The aspiration or biopsy sample is usually taken from the top of your hip bone. Sometimes, an aspiration sample is taken from your chest bone (sternum). °LET YOUR HEALTH CARE PROVIDER KNOW ABOUT: °· Any allergies you have. °· All medicines you are taking, including vitamins, herbs, eye drops, creams, and over-the-counter medicines. °· Previous problems you or members of your family have had with the use of anesthetics. °· Any blood disorders you have. °· Previous surgeries you have had. °· Any medical conditions you may have. °· Whether you are pregnant or you think that you may be pregnant. °RISKS AND COMPLICATIONS °Generally, this is a safe procedure. However, problems may occur, including: °· Infection. °· Bleeding. °BEFORE THE PROCEDURE °· Ask your health care provider about: °¨ Changing or stopping your regular medicines. This is especially important if you are taking diabetes medicines or blood thinners. °¨ Taking medicines such as aspirin and ibuprofen. These medicines can thin your blood. Do not take these medicines before your procedure if your health care provider instructs you not to. °· Plan to have someone take you home after the procedure. °· If you go home right after the procedure, plan to have someone with you for 24 hours. °PROCEDURE  °· An  IV tube may be inserted into one of your veins. °· The injection site will be cleaned with a germ-killing solution (antiseptic). °· You will be given one or more of the following: °¨ A medicine that helps you relax (sedative). °¨ A medicine that numbs the area (local anesthetic). °· The bone marrow sample will be removed as follows: °¨ For an aspiration, a hollow needle will be inserted through your skin and into your bone. Bone marrow fluid will be drawn up into a syringe. °¨ For a biopsy, your health care provider will use a hollow needle to remove a core of tissue from your bone marrow. °· The needle will be removed. °· A bandage (dressing) will be placed over the insertion site and taped in place. °The procedure may vary among health care providers and hospitals. °AFTER THE PROCEDURE °· Your blood pressure, heart rate, breathing rate, and blood oxygen level will be monitored often until the medicines you were given have worn off. °· Return to your normal activities as directed by your health care provider. °  °This information is not intended to replace advice given to you by your health care provider. Make sure you discuss any questions you have with your health care provider. °  °Document Released: 07/04/2004 Document Revised: 11/15/2014 Document Reviewed: 06/22/2014 °Elsevier Interactive Patient Education ©2016 [employer] ° °Bone Marrow Aspiration and Bone Marrow Biopsy, Care After °Refer to this sheet in the next few weeks. These instructions provide you with information about caring for yourself after your procedure. Your health care provider may also give   you more specific instructions. Your treatment has been planned according to current medical practices, but problems sometimes occur. Call your health care provider if you have any problems or questions after your procedure. °WHAT TO EXPECT AFTER THE PROCEDURE °After your procedure, it is common to have: °· Soreness or tenderness around the puncture  site. °· Bruising. °HOME CARE INSTRUCTIONS °· Take medicines only as directed by your health care provider. °· Follow your health care provider's instructions about: °¨ Puncture site care. °¨ Bandage (dressing) changes and removal. °· Bathe and shower as directed by your health care provider. °· Check your puncture site every day for signs of infection. Watch for: °¨ Redness, swelling, or pain. °¨ Fluid, blood, or pus. °· Return to your normal activities as directed by your health care provider. °· Keep all follow-up visits as directed by your health care provider. This is important. °SEEK MEDICAL CARE IF: °· You have a fever. °· You have uncontrollable bleeding. °· You have redness, swelling, or pain at the site of your puncture. °· You have fluid, blood, or pus coming from your puncture site. °  °This information is not intended to replace advice given to you by your health care provider. Make sure you discuss any questions you have with your health care provider. °  °Document Released: 01/18/2005 Document Revised: 11/15/2014 Document Reviewed: 06/22/2014 °Elsevier Interactive Patient Education ©2016 Elsevier Inc. ° °Moderate Conscious Sedation, Adult, Care After °Refer to this sheet in the next few weeks. These instructions provide you with information on caring for yourself after your procedure. Your health care provider may also give you more specific instructions. Your treatment has been planned according to current medical practices, but problems sometimes occur. Call your health care provider if you have any problems or questions after your procedure. °WHAT TO EXPECT AFTER THE PROCEDURE  °After your procedure: °· You may feel sleepy, clumsy, and have poor balance for several hours. °· Vomiting may occur if you eat too soon after the procedure. °HOME CARE INSTRUCTIONS °· Do not participate in any activities where you could become injured for at least 24 hours. Do not: °¨ Drive. °¨ Swim. °¨ Ride a  bicycle. °¨ Operate heavy machinery. °¨ Cook. °¨ Use power tools. °¨ Climb ladders. °¨ Work from a high place. °· Do not make important decisions or sign legal documents until you are improved. °· If you vomit, drink water, juice, or soup when you can drink without vomiting. Make sure you have little or no nausea before eating solid foods. °· Only take over-the-counter or prescription medicines for pain, discomfort, or fever as directed by your health care provider. °· Make sure you and your family fully understand everything about the medicines given to you, including what side effects may occur. °· You should not drink alcohol, take sleeping pills, or take medicines that cause drowsiness for at least 24 hours. °· If you smoke, do not smoke without supervision. °· If you are feeling better, you may resume normal activities 24 hours after you were sedated. °· Keep all appointments with your health care provider. °SEEK MEDICAL CARE IF: °· Your skin is pale or bluish in color. °· You continue to feel nauseous or vomit. °· Your pain is getting worse and is not helped by medicine. °· You have bleeding or swelling. °· You are still sleepy or feeling clumsy after 24 hours. °SEEK IMMEDIATE MEDICAL CARE IF: °· You develop a rash. °· You have difficulty breathing. °· You develop any   of allergic problem.  You have a fever. MAKE SURE YOU:  Understand these instructions.  Will watch your condition.  Will get help right away if you are not doing well or get worse.   This information is not intended to replace advice given to you by your health care provider. Make sure you discuss any questions you have with your health care provider.   Document Released: 04/21/2013 Document Revised: 07/22/2014 Document Reviewed: 04/21/2013 Elsevier Interactive Patient Education [employer].

## 2016-02-08 ENCOUNTER — Encounter: Payer: Self-pay | Admitting: *Deleted

## 2016-02-09 ENCOUNTER — Telehealth: Payer: Self-pay | Admitting: Oncology

## 2016-02-09 ENCOUNTER — Ambulatory Visit (HOSPITAL_BASED_OUTPATIENT_CLINIC_OR_DEPARTMENT_OTHER): Payer: BC Managed Care – PPO | Admitting: Oncology

## 2016-02-09 DIAGNOSIS — Z85038 Personal history of other malignant neoplasm of large intestine: Secondary | ICD-10-CM | POA: Diagnosis not present

## 2016-02-09 DIAGNOSIS — N289 Disorder of kidney and ureter, unspecified: Secondary | ICD-10-CM

## 2016-02-09 DIAGNOSIS — D649 Anemia, unspecified: Secondary | ICD-10-CM

## 2016-02-09 DIAGNOSIS — D6189 Other specified aplastic anemias and other bone marrow failure syndromes: Secondary | ICD-10-CM

## 2016-02-09 DIAGNOSIS — D539 Nutritional anemia, unspecified: Secondary | ICD-10-CM | POA: Diagnosis not present

## 2016-02-09 DIAGNOSIS — D631 Anemia in chronic kidney disease: Secondary | ICD-10-CM | POA: Insufficient documentation

## 2016-02-09 DIAGNOSIS — I10 Essential (primary) hypertension: Secondary | ICD-10-CM

## 2016-02-09 DIAGNOSIS — N189 Chronic kidney disease, unspecified: Secondary | ICD-10-CM | POA: Insufficient documentation

## 2016-02-09 NOTE — Telephone Encounter (Signed)
Gave pt cal & avs °

## 2016-02-09 NOTE — Progress Notes (Signed)
Hematology and Oncology Follow Up Visit  Patricia Mata [id number] April 29, 1956 60 y.o. 02/09/2016 12:46 PM   Principle Diagnosis: 60 year old with:  1. T3 N1 colon cancer in 10/2006. She had been in remission since the completion of therapy in 2008. 2. Macrocytic anemia etiology is unclear workup is ongoing.   Prior Therapy: She is S/P hemicolectomy done in 11/2006. 1/13 lymph nodes are positive. (T3N1). She is S/P 12 cycles of FOLFOX completed in 05/2007  Current therapy: Observation and follow up   Interim History:  Patricia Mata return today for a follow up visit. Since the last visit, she underwent a bone marrow biopsy and tolerated it well. She reports no delayed issues other than slight pain that resolved 2 days after. She does have symptoms of gout and her foot which is not interfering with her ability to ambulate and performs activities of daily living.  She denied any fatigue or tiredness. She denied any bleeding issues such as hemoptysis or hematemesis. She denied any hematochezia or melena. She continues to be active and performs activities of daily living. She works 2 jobs without any changes in her ability to do so.  She is not report any headaches blurry vision, syncope or seizures. She does not report any fevers, chills sweats or weight loss. He does not report any chest pain, palpitation orthopnea or leg edema. She does not report any cough, wheezing or hemoptysis. She does not report any nausea, vomiting or abdominal pain. She does not report any frequency urgency or hesitancy. She does not report any skeletal complaints. Remaining review of systems unremarkable.  Medications: I have reviewed the patient's current medications.   Allergies:  Allergies  Allergen Reactions  . Sulfa Antibiotics Rash and Other (See Comments)    blister     Physical Exam: Blood pressure (!) 206/54, pulse 77, temperature 98.2 F (36.8 C), temperature source Oral, resp. rate 18, height _0   (1.626 m), weight 237 lb 1.6 oz (107.5 kg), SpO2 99 %. ECOG: 0 General appearance: Well-appearing woman without distress. Head: Normocephalic, without obvious abnormality no oral ulcers or lesions. Neck: no adenopathy Lymph nodes: Cervical, supraclavicular, and axillary nodes normal. Heart:regular rate and rhythm, S1, S2 normal, no murmur, click, rub or gallop Lung:chest clear, no wheezing, rales, normal symmetric air entry Abdomin: soft, non-tender, without masses or organomegaly no rebound or guarding. EXT:no erythema, induration, or nodules   Lab Results: Lab Results  Component Value Date   WBC 4.5 02/01/2016   HGB 7.5 (L) 02/01/2016   HCT 21.5 (L) 02/01/2016   MCV 116.2 (H) 02/01/2016   PLT 377 02/01/2016     Chemistry      Component Value Date/Time   NA 139 11/29/2015 1116   K 4.2 11/29/2015 1116   CL 96 (L) 03/31/2012 1513   CO2 25 11/29/2015 1116   BUN 19.1 11/29/2015 1116   CREATININE 1.1 11/29/2015 1116      Component Value Date/Time   CALCIUM 9.7 11/29/2015 1116   ALKPHOS 53 11/29/2015 1116   AST 20 11/29/2015 1116   ALT 28 11/29/2015 1116   BILITOT 0.99 11/29/2015 1116     Results for PIHU, BASIL (MRN [id number]) as of 12/20/2015 07:51  Ref. Range 11/29/2015 11:15  M Protein SerPl Elph-Mcnc Latest Ref Range: Not Observed g/dL Not Observed  Results for CYSTAL, SHANNAHAN (MRN [id number]) as of 12/20/2015 07:51  Ref. Range 11/29/2015 11:15  Vitamin B12 Latest Ref Range: 211-946 pg/mL 1,066 (H)    Impression and  Plan:  60 year old female with the following issues:  1. Stage III (T3N1) colon cancer. She is 9 years out from the completion of therapy without any evidence to suggest recurrent disease.  2. Macrocytic anemia: Her hemoglobin currently is 7.5 with an MCV 116. Her workup did not reveal any vitamin deficiency. She does have an element of renal insufficiency with a creatinine clearance of 66 mL/m.  Her bone marrow biopsy obtained on 02/01/2016 showed  hypercellular marrow with the dyspoietic changes. 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 Her cytogenetics did not reveal any specific abnormalities. Significant blasts were noted.  The natural course of this disease was reviewed today and her IPSS-R score was calculated and based on her characteristics she has a score of 2.5. That puts her in the low category. This category carries a overall median survival about 5.3 years. treatment options were reviewed including continued observation and surveillance, growth factor support, transfusion and possibly Revlimid.  After discussion today she is agreeable to try Aranesp to boost her red cell production. She has also anemia of renal insufficiency which might be another reason to use Aranesp at this time. Risks and benefits associated with this medication were reviewed. Complications include hypertension, thromboembolic phenomenon, cardiovascular complications were reviewed and she is agreeable to proceed.  The plan is to start Aranesp 300 g every 3 weeks to keep her hemoglobin close to 10.      3. Hypertension: Usually her blood pressure is under reasonable control with today's elevated related to her pain from gout.  4. Gout: Follows with her primary care physician regarding this issue and seems to be improving.  5. Follow-up: Will be in October to assess the response to Aranesp.    [facility], MD 7/28/201712:46 PM

## 2016-02-13 LAB — TISSUE HYBRIDIZATION (BONE MARROW)-NCBH

## 2016-02-13 LAB — CHROMOSOME ANALYSIS, BONE MARROW

## 2016-02-16 ENCOUNTER — Ambulatory Visit (HOSPITAL_BASED_OUTPATIENT_CLINIC_OR_DEPARTMENT_OTHER): Payer: BC Managed Care – PPO

## 2016-02-16 ENCOUNTER — Other Ambulatory Visit (HOSPITAL_BASED_OUTPATIENT_CLINIC_OR_DEPARTMENT_OTHER): Payer: BC Managed Care – PPO

## 2016-02-16 VITALS — BP 160/65 | HR 69 | Temp 98.7°F | Resp 20

## 2016-02-16 DIAGNOSIS — D649 Anemia, unspecified: Secondary | ICD-10-CM

## 2016-02-16 DIAGNOSIS — N289 Disorder of kidney and ureter, unspecified: Secondary | ICD-10-CM

## 2016-02-16 DIAGNOSIS — D6189 Other specified aplastic anemias and other bone marrow failure syndromes: Secondary | ICD-10-CM

## 2016-02-16 LAB — CBC WITH DIFFERENTIAL/PLATELET
BASO%: 0.7 % (ref 0.0–2.0)
Basophils Absolute: 0 10*3/uL (ref 0.0–0.1)
EOS%: 1.8 % (ref 0.0–7.0)
Eosinophils Absolute: 0.1 10*3/uL (ref 0.0–0.5)
HCT: 20.9 % — ABNORMAL LOW (ref 34.8–46.6)
HGB: 7.1 g/dL — ABNORMAL LOW (ref 11.6–15.9)
LYMPH%: 18.2 % (ref 14.0–49.7)
MCH: 40.3 pg — ABNORMAL HIGH (ref 25.1–34.0)
MCHC: 34 g/dL (ref 31.5–36.0)
MCV: 118.7 fL — ABNORMAL HIGH (ref 79.5–101.0)
MONO#: 0.7 10*3/uL (ref 0.1–0.9)
MONO%: 18.8 % — ABNORMAL HIGH (ref 0.0–14.0)
NEUT#: 2.3 10*3/uL (ref 1.5–6.5)
NEUT%: 60.5 % (ref 38.4–76.8)
Platelets: 309 10*3/uL (ref 145–400)
RBC: 1.76 10*6/uL — ABNORMAL LOW (ref 3.70–5.45)
RDW: 15 % — ABNORMAL HIGH (ref 11.2–14.5)
WBC: 3.8 10*3/uL — ABNORMAL LOW (ref 3.9–10.3)
lymph#: 0.7 10*3/uL — ABNORMAL LOW (ref 0.9–3.3)

## 2016-02-16 MED ORDER — DARBEPOETIN ALFA 300 MCG/0.6ML IJ SOSY
300.0000 ug | PREFILLED_SYRINGE | Freq: Once | INTRAMUSCULAR | Status: AC
  Administered 2016-02-16: 300 ug via SUBCUTANEOUS
  Filled 2016-02-16: qty 0.6

## 2016-02-16 NOTE — Patient Instructions (Signed)
Darbepoetin Alfa injection What is this medicine? DARBEPOETIN ALFA (dar be POE e tin AL fa) helps your body make more red blood cells. It is used to treat anemia caused by chronic kidney failure and chemotherapy. This medicine may be used for other purposes; ask your health care provider or pharmacist if you have questions. What should I tell my health care provider before I take this medicine? They need to know if you have any of these conditions: -blood clotting disorders or history of blood clots -cancer patient not on chemotherapy -cystic fibrosis -heart disease, such as angina, heart failure, or a history of a heart attack -hemoglobin level of 12 g/dL or greater -high blood pressure -low levels of folate, iron, or vitamin B12 -seizures -an unusual or allergic reaction to darbepoetin, erythropoietin, albumin, hamster proteins, latex, other medicines, foods, dyes, or preservatives -pregnant or trying to get pregnant -breast-feeding How should I use this medicine? This medicine is for injection into a vein or under the skin. It is usually given by a health care professional in a hospital or clinic setting. If you get this medicine at home, you will be taught how to prepare and give this medicine. Do not shake the solution before you withdraw a dose. Use exactly as directed. Take your medicine at regular intervals. Do not take your medicine more often than directed. It is important that you put your used needles and syringes in a special sharps container. Do not put them in a trash can. If you do not have a sharps container, call your pharmacist or healthcare provider to get one. Talk to your pediatrician regarding the use of this medicine in children. While this medicine may be used in children as young as 1 year for selected conditions, precautions do apply. Overdosage: If you think you have taken too much of this medicine contact a poison control center or emergency room at once. NOTE:  This medicine is only for you. Do not share this medicine with others. What if I miss a dose? If you miss a dose, take it as soon as you can. If it is almost time for your next dose, take only that dose. Do not take double or extra doses. What may interact with this medicine? Do not take this medicine with any of the following medications: -epoetin alfa This list may not describe all possible interactions. Give your health care provider a list of all the medicines, herbs, non-prescription drugs, or dietary supplements you use. Also tell them if you smoke, drink alcohol, or use illegal drugs. Some items may interact with your medicine. What should I watch for while using this medicine? Visit your prescriber or health care professional for regular checks on your progress and for the needed blood tests and blood pressure measurements. It is especially important for the doctor to make sure your hemoglobin level is in the desired range, to limit the risk of potential side effects and to give you the best benefit. Keep all appointments for any recommended tests. Check your blood pressure as directed. Ask your doctor what your blood pressure should be and when you should contact him or her. As your body makes more red blood cells, you may need to take iron, folic acid, or vitamin B supplements. Ask your doctor or health care provider which products are right for you. If you have kidney disease continue dietary restrictions, even though this medication can make you feel better. Talk with your doctor or health care professional about the   foods you eat and the vitamins that you take. What side effects may I notice from receiving this medicine? Side effects that you should report to your doctor or health care professional as soon as possible: -allergic reactions like skin rash, itching or hives, swelling of the face, lips, or tongue -breathing problems -changes in vision -chest pain -confusion, trouble speaking  or understanding -feeling faint or lightheaded, falls -high blood pressure -muscle aches or pains -pain, swelling, warmth in the leg -rapid weight gain -severe headaches -sudden numbness or weakness of the face, arm or leg -trouble walking, dizziness, loss of balance or coordination -seizures (convulsions) -swelling of the ankles, feet, hands -unusually weak or tired Side effects that usually do not require medical attention (report to your doctor or health care professional if they continue or are bothersome): -diarrhea -fever, chills (flu-like symptoms) -headaches -nausea, vomiting -redness, stinging, or swelling at site where injected This list may not describe all possible side effects. Call your doctor for medical advice about side effects. You may report side effects to FDA at 1-800-FDA-1088. Where should I keep my medicine? Keep out of the reach of children. Store in a refrigerator between 2 and 8 degrees C (36 and 46 degrees F). Do not freeze. Do not shake. Throw away any unused portion if using a single-dose vial. Throw away any unused medicine after the expiration date. NOTE: This sheet is a summary. It may not cover all possible information. If you have questions about this medicine, talk to your doctor, pharmacist, or health care provider.    2016, Elsevier/Gold Standard. (2008-06-14 10:23:57)  

## 2016-02-21 ENCOUNTER — Encounter (HOSPITAL_COMMUNITY): Payer: Self-pay

## 2016-03-08 ENCOUNTER — Other Ambulatory Visit (HOSPITAL_BASED_OUTPATIENT_CLINIC_OR_DEPARTMENT_OTHER): Payer: BC Managed Care – PPO

## 2016-03-08 ENCOUNTER — Ambulatory Visit (HOSPITAL_BASED_OUTPATIENT_CLINIC_OR_DEPARTMENT_OTHER): Payer: BC Managed Care – PPO

## 2016-03-08 VITALS — BP 177/46 | HR 80 | Temp 98.9°F | Resp 20

## 2016-03-08 DIAGNOSIS — D649 Anemia, unspecified: Secondary | ICD-10-CM

## 2016-03-08 DIAGNOSIS — N289 Disorder of kidney and ureter, unspecified: Secondary | ICD-10-CM

## 2016-03-08 DIAGNOSIS — D6189 Other specified aplastic anemias and other bone marrow failure syndromes: Secondary | ICD-10-CM

## 2016-03-08 LAB — COMPREHENSIVE METABOLIC PANEL
ALT: 31 U/L (ref 0–55)
AST: 21 U/L (ref 5–34)
Albumin: 3.9 g/dL (ref 3.5–5.0)
Alkaline Phosphatase: 65 U/L (ref 40–150)
Anion Gap: 11 mEq/L (ref 3–11)
BUN: 27.3 mg/dL — ABNORMAL HIGH (ref 7.0–26.0)
CO2: 23 mEq/L (ref 22–29)
Calcium: 9.6 mg/dL (ref 8.4–10.4)
Chloride: 105 mEq/L (ref 98–109)
Creatinine: 1.1 mg/dL (ref 0.6–1.1)
EGFR: 66 mL/min/{1.73_m2} — ABNORMAL LOW (ref >90)
Glucose: 204 mg/dl — ABNORMAL HIGH (ref 70–140)
Potassium: 4.3 mEq/L (ref 3.5–5.1)
Sodium: 139 mEq/L (ref 136–145)
Total Bilirubin: 1.16 mg/dL (ref 0.20–1.20)
Total Protein: 7 g/dL (ref 6.4–8.3)

## 2016-03-08 LAB — CBC WITH DIFFERENTIAL/PLATELET
BASO%: 0.6 % (ref 0.0–2.0)
Basophils Absolute: 0 10*3/uL (ref 0.0–0.1)
EOS%: 1.4 % (ref 0.0–7.0)
Eosinophils Absolute: 0.1 10*3/uL (ref 0.0–0.5)
HCT: 21.6 % — ABNORMAL LOW (ref 34.8–46.6)
HGB: 7.4 g/dL — ABNORMAL LOW (ref 11.6–15.9)
LYMPH%: 17 % (ref 14.0–49.7)
MCH: 41.3 pg — ABNORMAL HIGH (ref 25.1–34.0)
MCHC: 34.4 g/dL (ref 31.5–36.0)
MCV: 120.2 fL — ABNORMAL HIGH (ref 79.5–101.0)
MONO#: 0.7 10*3/uL (ref 0.1–0.9)
MONO%: 19.6 % — ABNORMAL HIGH (ref 0.0–14.0)
NEUT#: 2.3 10*3/uL (ref 1.5–6.5)
NEUT%: 61.4 % (ref 38.4–76.8)
Platelets: 260 10*3/uL (ref 145–400)
RBC: 1.8 10*6/uL — ABNORMAL LOW (ref 3.70–5.45)
RDW: 14.7 % — ABNORMAL HIGH (ref 11.2–14.5)
WBC: 3.8 10*3/uL — ABNORMAL LOW (ref 3.9–10.3)
lymph#: 0.6 10*3/uL — ABNORMAL LOW (ref 0.9–3.3)

## 2016-03-08 LAB — IRON AND TIBC
%SAT: 95 % — ABNORMAL HIGH (ref 21–57)
Iron: 232 ug/dL — ABNORMAL HIGH (ref 41–142)
TIBC: 245 ug/dL (ref 236–444)
UIBC: 13 ug/dL — ABNORMAL LOW (ref 120–384)

## 2016-03-08 LAB — FERRITIN: Ferritin: 279 ng/ml — ABNORMAL HIGH (ref 9–269)

## 2016-03-08 MED ORDER — DARBEPOETIN ALFA 300 MCG/0.6ML IJ SOSY
300.0000 ug | PREFILLED_SYRINGE | Freq: Once | INTRAMUSCULAR | Status: AC
  Administered 2016-03-08: 300 ug via SUBCUTANEOUS
  Filled 2016-03-08: qty 0.6

## 2016-03-08 NOTE — Patient Instructions (Signed)
Darbepoetin Alfa injection What is this medicine? DARBEPOETIN ALFA (dar be POE e tin AL fa) helps your body make more red blood cells. It is used to treat anemia caused by chronic kidney failure and chemotherapy. This medicine may be used for other purposes; ask your health care provider or pharmacist if you have questions. What should I tell my health care provider before I take this medicine? They need to know if you have any of these conditions: -blood clotting disorders or history of blood clots -cancer patient not on chemotherapy -cystic fibrosis -heart disease, such as angina, heart failure, or a history of a heart attack -hemoglobin level of 12 g/dL or greater -high blood pressure -low levels of folate, iron, or vitamin B12 -seizures -an unusual or allergic reaction to darbepoetin, erythropoietin, albumin, hamster proteins, latex, other medicines, foods, dyes, or preservatives -pregnant or trying to get pregnant -breast-feeding How should I use this medicine? This medicine is for injection into a vein or under the skin. It is usually given by a health care professional in a hospital or clinic setting. If you get this medicine at home, you will be taught how to prepare and give this medicine. Do not shake the solution before you withdraw a dose. Use exactly as directed. Take your medicine at regular intervals. Do not take your medicine more often than directed. It is important that you put your used needles and syringes in a special sharps container. Do not put them in a trash can. If you do not have a sharps container, call your pharmacist or healthcare provider to get one. Talk to your pediatrician regarding the use of this medicine in children. While this medicine may be used in children as young as 1 year for selected conditions, precautions do apply. Overdosage: If you think you have taken too much of this medicine contact a poison control center or emergency room at once. NOTE:  This medicine is only for you. Do not share this medicine with others. What if I miss a dose? If you miss a dose, take it as soon as you can. If it is almost time for your next dose, take only that dose. Do not take double or extra doses. What may interact with this medicine? Do not take this medicine with any of the following medications: -epoetin alfa This list may not describe all possible interactions. Give your health care provider a list of all the medicines, herbs, non-prescription drugs, or dietary supplements you use. Also tell them if you smoke, drink alcohol, or use illegal drugs. Some items may interact with your medicine. What should I watch for while using this medicine? Visit your prescriber or health care professional for regular checks on your progress and for the needed blood tests and blood pressure measurements. It is especially important for the doctor to make sure your hemoglobin level is in the desired range, to limit the risk of potential side effects and to give you the best benefit. Keep all appointments for any recommended tests. Check your blood pressure as directed. Ask your doctor what your blood pressure should be and when you should contact him or her. As your body makes more red blood cells, you may need to take iron, folic acid, or vitamin B supplements. Ask your doctor or health care provider which products are right for you. If you have kidney disease continue dietary restrictions, even though this medication can make you feel better. Talk with your doctor or health care professional about the   foods you eat and the vitamins that you take. What side effects may I notice from receiving this medicine? Side effects that you should report to your doctor or health care professional as soon as possible: -allergic reactions like skin rash, itching or hives, swelling of the face, lips, or tongue -breathing problems -changes in vision -chest pain -confusion, trouble speaking  or understanding -feeling faint or lightheaded, falls -high blood pressure -muscle aches or pains -pain, swelling, warmth in the leg -rapid weight gain -severe headaches -sudden numbness or weakness of the face, arm or leg -trouble walking, dizziness, loss of balance or coordination -seizures (convulsions) -swelling of the ankles, feet, hands -unusually weak or tired Side effects that usually do not require medical attention (report to your doctor or health care professional if they continue or are bothersome): -diarrhea -fever, chills (flu-like symptoms) -headaches -nausea, vomiting -redness, stinging, or swelling at site where injected This list may not describe all possible side effects. Call your doctor for medical advice about side effects. You may report side effects to FDA at 1-800-FDA-1088. Where should I keep my medicine? Keep out of the reach of children. Store in a refrigerator between 2 and 8 degrees C (36 and 46 degrees F). Do not freeze. Do not shake. Throw away any unused portion if using a single-dose vial. Throw away any unused medicine after the expiration date. NOTE: This sheet is a summary. It may not cover all possible information. If you have questions about this medicine, talk to your doctor, pharmacist, or health care provider.    2016, Elsevier/Gold Standard. (2008-06-14 10:23:57)  

## 2016-03-08 NOTE — Progress Notes (Signed)
Pt HGB 7.4 today, pt Is asymptomatic. BP first check 177/46, pt states she did not take her BP meds this morning and also had her car towed so she was under a lot of stress. States her son is picking her up after her injection today.   Spoke with Dr. Julien Nordmann who states Aranesp is ok to give if BP is rechecked and systolic comes down to 0000000 or below, if it does not come down pt to receive clonidine before injection. Pt does not need blood today per MD.  BP rechecked: 169/52  Per Dr. Julien Nordmann check with Dr. Benay Spice on call, Per Dr. Benay Spice ok to give with BP at 169/52, pt to take BP meds when she gets home.

## 2016-03-29 ENCOUNTER — Ambulatory Visit (HOSPITAL_BASED_OUTPATIENT_CLINIC_OR_DEPARTMENT_OTHER): Payer: BC Managed Care – PPO

## 2016-03-29 ENCOUNTER — Other Ambulatory Visit (HOSPITAL_BASED_OUTPATIENT_CLINIC_OR_DEPARTMENT_OTHER): Payer: BC Managed Care – PPO

## 2016-03-29 VITALS — BP 168/54 | HR 74 | Temp 98.8°F | Resp 19

## 2016-03-29 DIAGNOSIS — D6189 Other specified aplastic anemias and other bone marrow failure syndromes: Secondary | ICD-10-CM

## 2016-03-29 DIAGNOSIS — N289 Disorder of kidney and ureter, unspecified: Secondary | ICD-10-CM

## 2016-03-29 DIAGNOSIS — D649 Anemia, unspecified: Secondary | ICD-10-CM | POA: Diagnosis not present

## 2016-03-29 LAB — CBC WITH DIFFERENTIAL/PLATELET
BASO%: 0.2 % (ref 0.0–2.0)
Basophils Absolute: 0 10*3/uL (ref 0.0–0.1)
EOS%: 0.9 % (ref 0.0–7.0)
Eosinophils Absolute: 0 10*3/uL (ref 0.0–0.5)
HCT: 21 % — ABNORMAL LOW (ref 34.8–46.6)
HGB: 7.2 g/dL — ABNORMAL LOW (ref 11.6–15.9)
LYMPH%: 15.5 % (ref 14.0–49.7)
MCH: 40.7 pg — ABNORMAL HIGH (ref 25.1–34.0)
MCHC: 34.3 g/dL (ref 31.5–36.0)
MCV: 118.6 fL — ABNORMAL HIGH (ref 79.5–101.0)
MONO#: 0.9 10*3/uL (ref 0.1–0.9)
MONO%: 19.9 % — ABNORMAL HIGH (ref 0.0–14.0)
NEUT#: 2.7 10*3/uL (ref 1.5–6.5)
NEUT%: 63.5 % (ref 38.4–76.8)
Platelets: 293 10*3/uL (ref 145–400)
RBC: 1.77 10*6/uL — ABNORMAL LOW (ref 3.70–5.45)
RDW: 14.7 % — ABNORMAL HIGH (ref 11.2–14.5)
WBC: 4.3 10*3/uL (ref 3.9–10.3)
lymph#: 0.7 10*3/uL — ABNORMAL LOW (ref 0.9–3.3)

## 2016-03-29 MED ORDER — DARBEPOETIN ALFA 300 MCG/0.6ML IJ SOSY
300.0000 ug | PREFILLED_SYRINGE | Freq: Once | INTRAMUSCULAR | Status: AC
  Administered 2016-03-29: 300 ug via SUBCUTANEOUS
  Filled 2016-03-29: qty 0.6

## 2016-03-29 NOTE — Patient Instructions (Signed)
Darbepoetin Alfa injection What is this medicine? DARBEPOETIN ALFA (dar be POE e tin AL fa) helps your body make more red blood cells. It is used to treat anemia caused by chronic kidney failure and chemotherapy. This medicine may be used for other purposes; ask your health care provider or pharmacist if you have questions. What should I tell my health care provider before I take this medicine? They need to know if you have any of these conditions: -blood clotting disorders or history of blood clots -cancer patient not on chemotherapy -cystic fibrosis -heart disease, such as angina, heart failure, or a history of a heart attack -hemoglobin level of 12 g/dL or greater -high blood pressure -low levels of folate, iron, or vitamin B12 -seizures -an unusual or allergic reaction to darbepoetin, erythropoietin, albumin, hamster proteins, latex, other medicines, foods, dyes, or preservatives -pregnant or trying to get pregnant -breast-feeding How should I use this medicine? This medicine is for injection into a vein or under the skin. It is usually given by a health care professional in a hospital or clinic setting. If you get this medicine at home, you will be taught how to prepare and give this medicine. Do not shake the solution before you withdraw a dose. Use exactly as directed. Take your medicine at regular intervals. Do not take your medicine more often than directed. It is important that you put your used needles and syringes in a special sharps container. Do not put them in a trash can. If you do not have a sharps container, call your pharmacist or healthcare provider to get one. Talk to your pediatrician regarding the use of this medicine in children. While this medicine may be used in children as young as 1 year for selected conditions, precautions do apply. Overdosage: If you think you have taken too much of this medicine contact a poison control center or emergency room at once. NOTE:  This medicine is only for you. Do not share this medicine with others. What if I miss a dose? If you miss a dose, take it as soon as you can. If it is almost time for your next dose, take only that dose. Do not take double or extra doses. What may interact with this medicine? Do not take this medicine with any of the following medications: -epoetin alfa This list may not describe all possible interactions. Give your health care provider a list of all the medicines, herbs, non-prescription drugs, or dietary supplements you use. Also tell them if you smoke, drink alcohol, or use illegal drugs. Some items may interact with your medicine. What should I watch for while using this medicine? Visit your prescriber or health care professional for regular checks on your progress and for the needed blood tests and blood pressure measurements. It is especially important for the doctor to make sure your hemoglobin level is in the desired range, to limit the risk of potential side effects and to give you the best benefit. Keep all appointments for any recommended tests. Check your blood pressure as directed. Ask your doctor what your blood pressure should be and when you should contact him or her. As your body makes more red blood cells, you may need to take iron, folic acid, or vitamin B supplements. Ask your doctor or health care provider which products are right for you. If you have kidney disease continue dietary restrictions, even though this medication can make you feel better. Talk with your doctor or health care professional about the   foods you eat and the vitamins that you take. What side effects may I notice from receiving this medicine? Side effects that you should report to your doctor or health care professional as soon as possible: -allergic reactions like skin rash, itching or hives, swelling of the face, lips, or tongue -breathing problems -changes in vision -chest pain -confusion, trouble speaking  or understanding -feeling faint or lightheaded, falls -high blood pressure -muscle aches or pains -pain, swelling, warmth in the leg -rapid weight gain -severe headaches -sudden numbness or weakness of the face, arm or leg -trouble walking, dizziness, loss of balance or coordination -seizures (convulsions) -swelling of the ankles, feet, hands -unusually weak or tired Side effects that usually do not require medical attention (report to your doctor or health care professional if they continue or are bothersome): -diarrhea -fever, chills (flu-like symptoms) -headaches -nausea, vomiting -redness, stinging, or swelling at site where injected This list may not describe all possible side effects. Call your doctor for medical advice about side effects. You may report side effects to FDA at 1-800-FDA-1088. Where should I keep my medicine? Keep out of the reach of children. Store in a refrigerator between 2 and 8 degrees C (36 and 46 degrees F). Do not freeze. Do not shake. Throw away any unused portion if using a single-dose vial. Throw away any unused medicine after the expiration date. NOTE: This sheet is a summary. It may not cover all possible information. If you have questions about this medicine, talk to your doctor, pharmacist, or health care provider.    2016, Elsevier/Gold Standard. (2008-06-14 10:23:57)  

## 2016-04-19 ENCOUNTER — Other Ambulatory Visit (HOSPITAL_BASED_OUTPATIENT_CLINIC_OR_DEPARTMENT_OTHER): Payer: BC Managed Care – PPO

## 2016-04-19 ENCOUNTER — Telehealth: Payer: Self-pay | Admitting: Oncology

## 2016-04-19 ENCOUNTER — Ambulatory Visit (HOSPITAL_BASED_OUTPATIENT_CLINIC_OR_DEPARTMENT_OTHER): Payer: BC Managed Care – PPO

## 2016-04-19 ENCOUNTER — Ambulatory Visit (HOSPITAL_BASED_OUTPATIENT_CLINIC_OR_DEPARTMENT_OTHER): Payer: BC Managed Care – PPO | Admitting: Oncology

## 2016-04-19 VITALS — BP 155/57

## 2016-04-19 VITALS — BP 161/37 | HR 79 | Temp 98.4°F | Resp 20 | Ht 64.0 in | Wt 236.8 lb

## 2016-04-19 DIAGNOSIS — N189 Chronic kidney disease, unspecified: Secondary | ICD-10-CM

## 2016-04-19 DIAGNOSIS — D6189 Other specified aplastic anemias and other bone marrow failure syndromes: Secondary | ICD-10-CM

## 2016-04-19 DIAGNOSIS — M109 Gout, unspecified: Secondary | ICD-10-CM

## 2016-04-19 DIAGNOSIS — D539 Nutritional anemia, unspecified: Secondary | ICD-10-CM

## 2016-04-19 DIAGNOSIS — I10 Essential (primary) hypertension: Secondary | ICD-10-CM

## 2016-04-19 DIAGNOSIS — D500 Iron deficiency anemia secondary to blood loss (chronic): Secondary | ICD-10-CM

## 2016-04-19 DIAGNOSIS — D469 Myelodysplastic syndrome, unspecified: Secondary | ICD-10-CM

## 2016-04-19 DIAGNOSIS — D631 Anemia in chronic kidney disease: Secondary | ICD-10-CM

## 2016-04-19 DIAGNOSIS — Z85038 Personal history of other malignant neoplasm of large intestine: Secondary | ICD-10-CM

## 2016-04-19 LAB — CBC WITH DIFFERENTIAL/PLATELET
BASO%: 0.8 % (ref 0.0–2.0)
Basophils Absolute: 0 10*3/uL (ref 0.0–0.1)
EOS%: 2 % (ref 0.0–7.0)
Eosinophils Absolute: 0.1 10*3/uL (ref 0.0–0.5)
HCT: 21.1 % — ABNORMAL LOW (ref 34.8–46.6)
HGB: 7.2 g/dL — ABNORMAL LOW (ref 11.6–15.9)
LYMPH%: 18.4 % (ref 14.0–49.7)
MCH: 42 pg — ABNORMAL HIGH (ref 25.1–34.0)
MCHC: 34.3 g/dL (ref 31.5–36.0)
MCV: 122.7 fL — ABNORMAL HIGH (ref 79.5–101.0)
MONO#: 0.7 10*3/uL (ref 0.1–0.9)
MONO%: 20.1 % — ABNORMAL HIGH (ref 0.0–14.0)
NEUT#: 1.9 10*3/uL (ref 1.5–6.5)
NEUT%: 58.7 % (ref 38.4–76.8)
Platelets: 255 10*3/uL (ref 145–400)
RBC: 1.72 10*6/uL — ABNORMAL LOW (ref 3.70–5.45)
RDW: 14.7 % — ABNORMAL HIGH (ref 11.2–14.5)
WBC: 3.3 10*3/uL — ABNORMAL LOW (ref 3.9–10.3)
lymph#: 0.6 10*3/uL — ABNORMAL LOW (ref 0.9–3.3)

## 2016-04-19 MED ORDER — DARBEPOETIN ALFA 300 MCG/0.6ML IJ SOSY
300.0000 ug | PREFILLED_SYRINGE | Freq: Once | INTRAMUSCULAR | Status: AC
  Administered 2016-04-19: 300 ug via SUBCUTANEOUS
  Filled 2016-04-19: qty 0.6

## 2016-04-19 NOTE — Telephone Encounter (Signed)
Avs report and appointment schedule given to patient per 04/19/16 los.

## 2016-04-19 NOTE — Progress Notes (Signed)
Hematology and Oncology Follow Up Visit  Ladrea Holladay [id number] 1956/03/29 60 y.o. 04/19/2016 8:26 AM   Principle Diagnosis: 60 year old with:  1. T3 N1 colon cancer in 10/2006. She had been in remission since the completion of therapy in 2008. 2. 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 Her IPSS-R score was calculated and based on her characteristics she has a score of 2.5.    Prior Therapy: She is S/P hemicolectomy done in 11/2006. 1/13 lymph nodes are positive. (T3N1). She is S/P 12 cycles of FOLFOX completed in 05/2007  Current therapy: Aranesp to 300 g every 3 weeks.   Interim History:  Mrs. Bencivenga return today for a follow up visit. Since the last visit, she started Aranesp therapy and tolerated it well. She received 3 injections without complications. She does not report any new symptoms at this time. She denied any fatigue or tiredness. She denied any bleeding issues such as hemoptysis or hematemesis. She denied any hematochezia or melena. She continues to be active and performs activities of daily living. Appetite and weight remained stable. Her quality of life is not dramatically different.  She is not report any headaches blurry vision, syncope or seizures. She does not report any fevers, chills sweats or weight loss. He does not report any chest pain, palpitation orthopnea or leg edema. She does not report any cough, wheezing or hemoptysis. She does not report any nausea, vomiting or abdominal pain. She does not report any frequency urgency or hesitancy. She does not report any skeletal complaints. Remaining review of systems unremarkable.  Medications: I have reviewed the patient's current medications.   Allergies:  Allergies  Allergen Reactions  . Sulfa Antibiotics Rash     Physical Exam: Blood pressure (!) 161/37, pulse 79, temperature 98.4  F (36.9 C), temperature source Oral, resp. rate 20, height '5\' 4"'  (1.626 m), weight 236 lb 12.8 oz (107.4 kg), SpO2 99 %. ECOG: 0 General appearance: Alert, awake woman without distress. Head: Normocephalic, without obvious abnormality no rebound or guarding. Neck: no adenopathy Lymph nodes: Cervical, supraclavicular, and axillary nodes normal. Heart:regular rate and rhythm, S1, S2 normal, no murmur, click, rub or gallop Lung:chest clear, no wheezing, rales, normal symmetric air entry Abdomin: soft, non-tender, without masses or organomegaly no shifting dullness or ascites. EXT:no erythema, induration, or nodules   Lab Results: Lab Results  Component Value Date   WBC 3.3 (L) 04/19/2016   HGB 7.2 (L) 04/19/2016   HCT 21.1 (L) 04/19/2016   MCV 122.7 (H) 04/19/2016   PLT 255 04/19/2016     Chemistry      Component Value Date/Time   NA 139 03/08/2016 0909   K 4.3 03/08/2016 0909   CL 96 (L) 03/31/2012 1513   CO2 23 03/08/2016 0909   BUN 27.3 (H) 03/08/2016 0909   CREATININE 1.1 03/08/2016 0909      Component Value Date/Time   CALCIUM 9.6 03/08/2016 0909   ALKPHOS 65 03/08/2016 0909   AST 21 03/08/2016 0909   ALT 31 03/08/2016 0909   BILITOT 1.16 03/08/2016 0909       Impression and Plan:  60 year old female with the following issues:  1. Stage III (T3N1) colon cancer. She is 9 years out from the completion of therapy without any evidence to suggest recurrent disease.  2. Macrocytic anemia: Her hemoglobin currently is 7.5 with an MCV 116.  Her workup did not reveal any vitamin deficiency. She does have an element of renal insufficiency with a creatinine clearance of 66 mL/m.  Her bone marrow biopsy obtained on 02/01/2016 showed hypercellular marrow with the dyspoietic changes. 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 Her cytogenetics did not reveal any specific  abnormalities. Significant blasts were not noted. Her IPSS-R score was calculated and based on her characteristics she has a score of 2.5.    She is currently on Aranesp at 300 g every 3 weeks without any major changes in her hemoglobin. The plan is to continue on this regimen for another 3 series of injections and if she continues to be refractory, different treatment approach might be needed. I will be in the form of element or possibly using methyl laying agent. Stem cell transplant could be also considered given her excellent health and physical shape.     3. Hypertension: Usually her blood pressure is under reasonable control. Slightly elevated today.  4. Gout: Follows with her primary care physician regarding this issue and seems to be improving.  5. Follow-up: Will be in 3 months.    Kania Regnier, MD 10/6/20178:26 AM

## 2016-04-19 NOTE — Patient Instructions (Signed)
Darbepoetin Alfa injection What is this medicine? DARBEPOETIN ALFA (dar be POE e tin AL fa) helps your body make more red blood cells. It is used to treat anemia caused by chronic kidney failure and chemotherapy. This medicine may be used for other purposes; ask your health care provider or pharmacist if you have questions. What should I tell my health care provider before I take this medicine? They need to know if you have any of these conditions: -blood clotting disorders or history of blood clots -cancer patient not on chemotherapy -cystic fibrosis -heart disease, such as angina, heart failure, or a history of a heart attack -hemoglobin level of 12 g/dL or greater -high blood pressure -low levels of folate, iron, or vitamin B12 -seizures -an unusual or allergic reaction to darbepoetin, erythropoietin, albumin, hamster proteins, latex, other medicines, foods, dyes, or preservatives -pregnant or trying to get pregnant -breast-feeding How should I use this medicine? This medicine is for injection into a vein or under the skin. It is usually given by a health care professional in a hospital or clinic setting. If you get this medicine at home, you will be taught how to prepare and give this medicine. Do not shake the solution before you withdraw a dose. Use exactly as directed. Take your medicine at regular intervals. Do not take your medicine more often than directed. It is important that you put your used needles and syringes in a special sharps container. Do not put them in a trash can. If you do not have a sharps container, call your pharmacist or healthcare provider to get one. Talk to your pediatrician regarding the use of this medicine in children. While this medicine may be used in children as young as 1 year for selected conditions, precautions do apply. Overdosage: If you think you have taken too much of this medicine contact a poison control center or emergency room at once. NOTE:  This medicine is only for you. Do not share this medicine with others. What if I miss a dose? If you miss a dose, take it as soon as you can. If it is almost time for your next dose, take only that dose. Do not take double or extra doses. What may interact with this medicine? Do not take this medicine with any of the following medications: -epoetin alfa This list may not describe all possible interactions. Give your health care provider a list of all the medicines, herbs, non-prescription drugs, or dietary supplements you use. Also tell them if you smoke, drink alcohol, or use illegal drugs. Some items may interact with your medicine. What should I watch for while using this medicine? Visit your prescriber or health care professional for regular checks on your progress and for the needed blood tests and blood pressure measurements. It is especially important for the doctor to make sure your hemoglobin level is in the desired range, to limit the risk of potential side effects and to give you the best benefit. Keep all appointments for any recommended tests. Check your blood pressure as directed. Ask your doctor what your blood pressure should be and when you should contact him or her. As your body makes more red blood cells, you may need to take iron, folic acid, or vitamin B supplements. Ask your doctor or health care provider which products are right for you. If you have kidney disease continue dietary restrictions, even though this medication can make you feel better. Talk with your doctor or health care professional about the   foods you eat and the vitamins that you take. What side effects may I notice from receiving this medicine? Side effects that you should report to your doctor or health care professional as soon as possible: -allergic reactions like skin rash, itching or hives, swelling of the face, lips, or tongue -breathing problems -changes in vision -chest pain -confusion, trouble speaking  or understanding -feeling faint or lightheaded, falls -high blood pressure -muscle aches or pains -pain, swelling, warmth in the leg -rapid weight gain -severe headaches -sudden numbness or weakness of the face, arm or leg -trouble walking, dizziness, loss of balance or coordination -seizures (convulsions) -swelling of the ankles, feet, hands -unusually weak or tired Side effects that usually do not require medical attention (report to your doctor or health care professional if they continue or are bothersome): -diarrhea -fever, chills (flu-like symptoms) -headaches -nausea, vomiting -redness, stinging, or swelling at site where injected This list may not describe all possible side effects. Call your doctor for medical advice about side effects. You may report side effects to FDA at 1-800-FDA-1088. Where should I keep my medicine? Keep out of the reach of children. Store in a refrigerator between 2 and 8 degrees C (36 and 46 degrees F). Do not freeze. Do not shake. Throw away any unused portion if using a single-dose vial. Throw away any unused medicine after the expiration date. NOTE: This sheet is a summary. It may not cover all possible information. If you have questions about this medicine, talk to your doctor, pharmacist, or health care provider.    2016, Elsevier/Gold Standard. (2008-06-14 10:23:57)  

## 2016-05-10 ENCOUNTER — Other Ambulatory Visit (HOSPITAL_BASED_OUTPATIENT_CLINIC_OR_DEPARTMENT_OTHER): Payer: BC Managed Care – PPO

## 2016-05-10 ENCOUNTER — Ambulatory Visit (HOSPITAL_BASED_OUTPATIENT_CLINIC_OR_DEPARTMENT_OTHER): Payer: BC Managed Care – PPO

## 2016-05-10 ENCOUNTER — Other Ambulatory Visit: Payer: Self-pay | Admitting: Oncology

## 2016-05-10 VITALS — BP 157/88 | HR 70 | Temp 97.6°F | Resp 18

## 2016-05-10 DIAGNOSIS — N181 Chronic kidney disease, stage 1: Secondary | ICD-10-CM

## 2016-05-10 DIAGNOSIS — D631 Anemia in chronic kidney disease: Secondary | ICD-10-CM | POA: Insufficient documentation

## 2016-05-10 DIAGNOSIS — N189 Chronic kidney disease, unspecified: Secondary | ICD-10-CM

## 2016-05-10 DIAGNOSIS — D6189 Other specified aplastic anemias and other bone marrow failure syndromes: Secondary | ICD-10-CM

## 2016-05-10 DIAGNOSIS — D469 Myelodysplastic syndrome, unspecified: Secondary | ICD-10-CM

## 2016-05-10 DIAGNOSIS — D500 Iron deficiency anemia secondary to blood loss (chronic): Secondary | ICD-10-CM

## 2016-05-10 LAB — CBC WITH DIFFERENTIAL/PLATELET
BASO%: 0.5 % (ref 0.0–2.0)
Basophils Absolute: 0 10*3/uL (ref 0.0–0.1)
EOS%: 1.2 % (ref 0.0–7.0)
Eosinophils Absolute: 0.1 10*3/uL (ref 0.0–0.5)
HCT: 23 % — ABNORMAL LOW (ref 34.8–46.6)
HGB: 7.8 g/dL — ABNORMAL LOW (ref 11.6–15.9)
LYMPH%: 19.7 % (ref 14.0–49.7)
MCH: 41.6 pg — ABNORMAL HIGH (ref 25.1–34.0)
MCHC: 34 g/dL (ref 31.5–36.0)
MCV: 122.4 fL — ABNORMAL HIGH (ref 79.5–101.0)
MONO#: 0.9 10*3/uL (ref 0.1–0.9)
MONO%: 19.2 % — ABNORMAL HIGH (ref 0.0–14.0)
NEUT#: 2.7 10*3/uL (ref 1.5–6.5)
NEUT%: 59.4 % (ref 38.4–76.8)
Platelets: 315 10*3/uL (ref 145–400)
RBC: 1.88 10*6/uL — ABNORMAL LOW (ref 3.70–5.45)
RDW: 13.9 % (ref 11.2–14.5)
WBC: 4.5 10*3/uL (ref 3.9–10.3)
lymph#: 0.9 10*3/uL (ref 0.9–3.3)

## 2016-05-10 LAB — IRON AND TIBC
%SAT: 41 % (ref 21–57)
Iron: 114 ug/dL (ref 41–142)
TIBC: 279 ug/dL (ref 236–444)
UIBC: 165 ug/dL (ref 120–384)

## 2016-05-10 LAB — FERRITIN: Ferritin: 266 ng/ml (ref 9–269)

## 2016-05-10 MED ORDER — DARBEPOETIN ALFA 300 MCG/0.6ML IJ SOSY
300.0000 ug | PREFILLED_SYRINGE | Freq: Once | INTRAMUSCULAR | Status: AC
  Administered 2016-05-10: 300 ug via SUBCUTANEOUS
  Filled 2016-05-10: qty 0.6

## 2016-05-10 NOTE — Patient Instructions (Signed)
Darbepoetin Alfa injection What is this medicine? DARBEPOETIN ALFA (dar be POE e tin AL fa) helps your body make more red blood cells. It is used to treat anemia caused by chronic kidney failure and chemotherapy. This medicine may be used for other purposes; ask your health care provider or pharmacist if you have questions. What should I tell my health care provider before I take this medicine? They need to know if you have any of these conditions: -blood clotting disorders or history of blood clots -cancer patient not on chemotherapy -cystic fibrosis -heart disease, such as angina, heart failure, or a history of a heart attack -hemoglobin level of 12 g/dL or greater -high blood pressure -low levels of folate, iron, or vitamin B12 -seizures -an unusual or allergic reaction to darbepoetin, erythropoietin, albumin, hamster proteins, latex, other medicines, foods, dyes, or preservatives -pregnant or trying to get pregnant -breast-feeding How should I use this medicine? This medicine is for injection into a vein or under the skin. It is usually given by a health care professional in a hospital or clinic setting. If you get this medicine at home, you will be taught how to prepare and give this medicine. Do not shake the solution before you withdraw a dose. Use exactly as directed. Take your medicine at regular intervals. Do not take your medicine more often than directed. It is important that you put your used needles and syringes in a special sharps container. Do not put them in a trash can. If you do not have a sharps container, call your pharmacist or healthcare provider to get one. Talk to your pediatrician regarding the use of this medicine in children. While this medicine may be used in children as young as 1 year for selected conditions, precautions do apply. Overdosage: If you think you have taken too much of this medicine contact a poison control center or emergency room at once. NOTE:  This medicine is only for you. Do not share this medicine with others. What if I miss a dose? If you miss a dose, take it as soon as you can. If it is almost time for your next dose, take only that dose. Do not take double or extra doses. What may interact with this medicine? Do not take this medicine with any of the following medications: -epoetin alfa This list may not describe all possible interactions. Give your health care provider a list of all the medicines, herbs, non-prescription drugs, or dietary supplements you use. Also tell them if you smoke, drink alcohol, or use illegal drugs. Some items may interact with your medicine. What should I watch for while using this medicine? Visit your prescriber or health care professional for regular checks on your progress and for the needed blood tests and blood pressure measurements. It is especially important for the doctor to make sure your hemoglobin level is in the desired range, to limit the risk of potential side effects and to give you the best benefit. Keep all appointments for any recommended tests. Check your blood pressure as directed. Ask your doctor what your blood pressure should be and when you should contact him or her. As your body makes more red blood cells, you may need to take iron, folic acid, or vitamin B supplements. Ask your doctor or health care provider which products are right for you. If you have kidney disease continue dietary restrictions, even though this medication can make you feel better. Talk with your doctor or health care professional about the   foods you eat and the vitamins that you take. What side effects may I notice from receiving this medicine? Side effects that you should report to your doctor or health care professional as soon as possible: -allergic reactions like skin rash, itching or hives, swelling of the face, lips, or tongue -breathing problems -changes in vision -chest pain -confusion, trouble speaking  or understanding -feeling faint or lightheaded, falls -high blood pressure -muscle aches or pains -pain, swelling, warmth in the leg -rapid weight gain -severe headaches -sudden numbness or weakness of the face, arm or leg -trouble walking, dizziness, loss of balance or coordination -seizures (convulsions) -swelling of the ankles, feet, hands -unusually weak or tired Side effects that usually do not require medical attention (report to your doctor or health care professional if they continue or are bothersome): -diarrhea -fever, chills (flu-like symptoms) -headaches -nausea, vomiting -redness, stinging, or swelling at site where injected This list may not describe all possible side effects. Call your doctor for medical advice about side effects. You may report side effects to FDA at 1-800-FDA-1088. Where should I keep my medicine? Keep out of the reach of children. Store in a refrigerator between 2 and 8 degrees C (36 and 46 degrees F). Do not freeze. Do not shake. Throw away any unused portion if using a single-dose vial. Throw away any unused medicine after the expiration date. NOTE: This sheet is a summary. It may not cover all possible information. If you have questions about this medicine, talk to your doctor, pharmacist, or health care provider.    2016, Elsevier/Gold Standard. (2008-06-14 10:23:57)  

## 2016-05-26 ENCOUNTER — Emergency Department (HOSPITAL_COMMUNITY): Payer: BC Managed Care – PPO

## 2016-05-26 ENCOUNTER — Emergency Department (HOSPITAL_COMMUNITY)
Admission: EM | Admit: 2016-05-26 | Discharge: 2016-05-26 | Disposition: A | Discharge status: Home / Self Care | Payer: BC Managed Care – PPO | Attending: Emergency Medicine | Admitting: Emergency Medicine

## 2016-05-26 ENCOUNTER — Encounter (HOSPITAL_COMMUNITY): Payer: Self-pay

## 2016-05-26 DIAGNOSIS — R1013 Epigastric pain: Secondary | ICD-10-CM

## 2016-05-26 DIAGNOSIS — I10 Essential (primary) hypertension: Secondary | ICD-10-CM | POA: Insufficient documentation

## 2016-05-26 DIAGNOSIS — Z87891 Personal history of nicotine dependence: Secondary | ICD-10-CM | POA: Insufficient documentation

## 2016-05-26 DIAGNOSIS — R112 Nausea with vomiting, unspecified: Secondary | ICD-10-CM | POA: Diagnosis not present

## 2016-05-26 DIAGNOSIS — E119 Type 2 diabetes mellitus without complications: Secondary | ICD-10-CM | POA: Insufficient documentation

## 2016-05-26 DIAGNOSIS — Z79899 Other long term (current) drug therapy: Secondary | ICD-10-CM | POA: Insufficient documentation

## 2016-05-26 DIAGNOSIS — Z7984 Long term (current) use of oral hypoglycemic drugs: Secondary | ICD-10-CM | POA: Insufficient documentation

## 2016-05-26 LAB — COMPREHENSIVE METABOLIC PANEL
ALT: 25 U/L (ref 14–54)
AST: 24 U/L (ref 15–41)
Albumin: 4.7 g/dL (ref 3.5–5.0)
Alkaline Phosphatase: 53 U/L (ref 38–126)
Anion gap: 10 (ref 5–15)
BUN: 27 mg/dL — ABNORMAL HIGH (ref 6–20)
CO2: 24 mmol/L (ref 22–32)
Calcium: 10.2 mg/dL (ref 8.9–10.3)
Chloride: 103 mmol/L (ref 101–111)
Creatinine, Ser: 1.17 mg/dL — ABNORMAL HIGH (ref 0.44–1.00)
GFR calc Af Amer: 57 mL/min — ABNORMAL LOW (ref >60)
GFR calc non Af Amer: 49 mL/min — ABNORMAL LOW (ref >60)
Glucose, Bld: 242 mg/dL — ABNORMAL HIGH (ref 65–99)
Potassium: 4.8 mmol/L (ref 3.5–5.1)
Sodium: 137 mmol/L (ref 135–145)
Total Bilirubin: 1.1 mg/dL (ref 0.3–1.2)
Total Protein: 7.7 g/dL (ref 6.5–8.1)

## 2016-05-26 LAB — CBC
HCT: 23.7 % — ABNORMAL LOW (ref 36.0–46.0)
Hemoglobin: 8.2 g/dL — ABNORMAL LOW (ref 12.0–15.0)
MCH: 39.6 pg — ABNORMAL HIGH (ref 26.0–34.0)
MCHC: 34.6 g/dL (ref 30.0–36.0)
MCV: 114.5 fL — ABNORMAL HIGH (ref 78.0–100.0)
Platelets: 336 10*3/uL (ref 150–400)
RBC: 2.07 MIL/uL — ABNORMAL LOW (ref 3.87–5.11)
RDW: 12.5 % (ref 11.5–15.5)
WBC: 4.6 10*3/uL (ref 4.0–10.5)

## 2016-05-26 LAB — URINALYSIS, ROUTINE W REFLEX MICROSCOPIC
Bilirubin Urine: NEGATIVE
Glucose, UA: NEGATIVE mg/dL
Hgb urine dipstick: NEGATIVE
Ketones, ur: NEGATIVE mg/dL
Nitrite: NEGATIVE
Protein, ur: 30 mg/dL — AB
Specific Gravity, Urine: 1.015 (ref 1.005–1.030)
pH: 5.5 (ref 5.0–8.0)

## 2016-05-26 LAB — URINE MICROSCOPIC-ADD ON: RBC / HPF: NONE SEEN RBC/hpf (ref 0–5)

## 2016-05-26 LAB — LIPASE, BLOOD: Lipase: 31 U/L (ref 11–51)

## 2016-05-26 MED ORDER — CIPROFLOXACIN HCL 500 MG PO TABS
500.0000 mg | ORAL_TABLET | Freq: Once | ORAL | Status: AC
  Administered 2016-05-26: 500 mg via ORAL
  Filled 2016-05-26: qty 1

## 2016-05-26 MED ORDER — IOPAMIDOL (ISOVUE-370) INJECTION 76%
INTRAVENOUS | Status: AC
  Administered 2016-05-26: 100 mL
  Filled 2016-05-26: qty 100

## 2016-05-26 MED ORDER — METRONIDAZOLE 500 MG PO TABS
500.0000 mg | ORAL_TABLET | Freq: Once | ORAL | Status: AC
  Administered 2016-05-26: 500 mg via ORAL
  Filled 2016-05-26: qty 1

## 2016-05-26 MED ORDER — ONDANSETRON HCL 4 MG PO TABS: 4.0000 mg | ORAL_TABLET | Freq: Four times a day (QID) | ORAL | 0 refills | Status: DC

## 2016-05-26 MED ORDER — CIPROFLOXACIN HCL 500 MG PO TABS: 500.0000 mg | ORAL_TABLET | Freq: Two times a day (BID) | ORAL | 0 refills | Status: DC

## 2016-05-26 MED ORDER — METRONIDAZOLE 500 MG PO TABS: 500.0000 mg | ORAL_TABLET | Freq: Two times a day (BID) | ORAL | 0 refills | Status: DC

## 2016-05-26 NOTE — ED Notes (Signed)
Called radiology to follow up on CT results.  Will call back.

## 2016-05-26 NOTE — ED Triage Notes (Signed)
Patient complains of epigastric pain with nausea and vomiting x 2 weeks, states pain worse after eating. Using otc meds with some temporary relief. Alert and oriented, NAD

## 2016-05-26 NOTE — ED Notes (Signed)
Pt able to ambulate independently to restroom.  Gait steady and even.

## 2016-05-26 NOTE — ED Notes (Signed)
Pt understood dc material. Scripts given at [employer]

## 2016-05-26 NOTE — ED Provider Notes (Signed)
[facility] DEPT Provider Note   CSN: [id number] Arrival date & time: 05/26/16  1221     History   Chief Complaint Chief Complaint  Patient presents with  . Abdominal Pain  . Emesis    HPI Patricia Mata is a 60 y.o. female with history of anemia, hypertension, diabetes who presents with a two-week history of epigastric pain, nausea, 2 episodes of emesis. Patient reports her pain is improved with belching. She reports of fall feeling to her abdomen. Patient has been taking Rolaids, Pepto, Gas-X without much relief. Patient has had normal stools and denies any bloody stools. Patient reports her symptoms don't seem to be affected by eating. Of note, patient reports that the physician assistant at her primary care office mentioned a "murmur in her stomach" and said that she should follow up with cardiology. Patient has not yet had follow-up for this. Patient denies any fevers, chest pain, shortness of breath, urinary symptoms.  HPI  Past Medical History:  Diagnosis Date  . Diabetes mellitus without complication ([place])   . Hypertension   . Macrocytic anemia     Patient Active Problem List   Diagnosis Date Noted  . Anemia in chronic kidney disease 05/10/2016  . Anemia in stage 1 chronic kidney disease 05/10/2016  . Anemia 02/09/2016  . 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 COLON SURGERY      OB History    No data available       Home Medications    Prior to Admission medications   Medication Sig Start Date End Date Taking? Authorizing Provider  atorvastatin (LIPITOR) 40 MG tablet Take 40 mg by mouth daily.   Yes Historical Provider, MD  Azilsartan-Chlorthalidone (EDARBYCLOR) 40-12.5 MG TABS Take 1 tablet by mouth daily with lunch.   Yes Historical Provider, MD  colchicine 0.6 MG tablet Take 0.6 mg by mouth daily with lunch.  11/02/15  Yes Historical Provider, MD    glimepiride (AMARYL) 2 MG tablet Take 2 mg by mouth daily.  10/26/15  Yes Historical Provider, MD  hydrALAZINE (APRESOLINE) 25 MG tablet Take 50 mg by mouth 2 (two) times daily.   Yes Historical Provider, MD  latanoprost (XALATAN) 0.005 % ophthalmic solution Place 1 drop into both eyes at bedtime.    Yes Historical Provider, MD  metoprolol (LOPRESSOR) 50 MG tablet Take 50 mg by mouth 2 (two) times daily.  11/28/15  Yes Historical Provider, MD  PRESCRIPTION MEDICATION Infusion at cancer center every 3 months - last infusion 05/10/16   Yes Historical Provider, MD  sitaGLIPtin-metformin (JANUMET) 50-1000 MG tablet Take 1 tablet by mouth 2 (two) times daily.    Yes Historical Provider, MD  ciprofloxacin (CIPRO) 500 MG tablet Take 1 tablet (500 mg total) by mouth 2 (two) times daily. 05/26/16   Frederica Kuster, PA-C  metroNIDAZOLE (FLAGYL) 500 MG tablet Take 1 tablet (500 mg total) by mouth 2 (two) times daily. 05/26/16   Byanca Kasper M Rayah Fines, PA-C  ondansetron (ZOFRAN) 4 MG tablet Take 1 tablet (4 mg total) by mouth every 6 (six) hours. 05/26/16   Frederica Kuster, PA-C    Family History No family history on file.  Social History Social History  Substance Use Topics  . Smoking status: Former Smoker    Types: Cigarettes    Quit date: 01/31/2001  . Smokeless tobacco: Not on file  . Alcohol use 1.2 oz/week  2 Shots of liquor per week     Allergies   Sulfa antibiotics   Review of Systems Review of Systems  Constitutional: Negative for chills and fever.  HENT: Negative for facial swelling and sore throat.   Respiratory: Negative for shortness of breath.   Cardiovascular: Negative for chest pain.  Gastrointestinal: Positive for abdominal pain, nausea and vomiting (2 episodes).  Genitourinary: Negative for dysuria.  Musculoskeletal: Negative for back pain.  Skin: Negative for rash and wound.  Neurological: Negative for headaches.  Psychiatric/Behavioral: The patient is not nervous/anxious.       Physical Exam Updated Vital Signs BP 171/55   Pulse 63   Temp 97.8 F (36.6 C) (Oral)   Resp 18   Ht 5\' 3"  (1.6 m)   Wt 101.2 kg   SpO2 97%   BMI 39.50 kg/m   Physical Exam  Constitutional: She appears well-developed and well-nourished. No distress.  HENT:  Head: Normocephalic and atraumatic.  Mouth/Throat: Oropharynx is clear and moist. No oropharyngeal exudate.  Eyes: Conjunctivae are normal. Pupils are equal, round, and reactive to light. Right eye exhibits no discharge. Left eye exhibits no discharge. No scleral icterus.  Neck: Normal range of motion. Neck supple. No thyromegaly present.  Cardiovascular: Normal rate, regular rhythm, normal heart sounds and intact distal pulses.  Exam reveals no gallop and no friction rub.   No murmur heard. Pulmonary/Chest: Effort normal and breath sounds normal. No stridor. No respiratory distress. She has no wheezes. She has no rales.  Abdominal: Soft. Bowel sounds are normal. She exhibits abdominal bruit. She exhibits no distension. There is tenderness in the right upper quadrant and epigastric area. There is no rebound, no guarding and no tenderness at McBurney's point.  Musculoskeletal: She exhibits no edema.  Lymphadenopathy:    She has no cervical adenopathy.  Neurological: She is alert. Coordination normal.  Skin: Skin is warm and dry. No rash noted. She is not diaphoretic. No pallor.  Psychiatric: She has a normal mood and affect.  Nursing note and vitals reviewed.    ED Treatments / Results  Labs (all labs ordered are listed, but only abnormal results are displayed) Labs Reviewed  URINE CULTURE - Abnormal; Notable for the following:       Result Value   Culture MULTIPLE SPECIES PRESENT, SUGGEST RECOLLECTION (*)    All other components within normal limits  COMPREHENSIVE METABOLIC PANEL - Abnormal; Notable for the following:    Glucose, Bld 242 (*)    BUN 27 (*)    Creatinine, Ser 1.17 (*)    GFR calc non Af Amer 49  (*)    GFR calc Af Amer 57 (*)    All other components within normal limits  CBC - Abnormal; Notable for the following:    RBC 2.07 (*)    Hemoglobin 8.2 (*)    HCT 23.7 (*)    MCV 114.5 (*)    MCH 39.6 (*)    All other components within normal limits  URINALYSIS, ROUTINE W REFLEX MICROSCOPIC (NOT AT [facility]) - Abnormal; Notable for the following:    Color, Urine AMBER (*)    APPearance CLOUDY (*)    Protein, ur 30 (*)    Leukocytes, UA MODERATE (*)    All other components within normal limits  URINE MICROSCOPIC-ADD ON - Abnormal; Notable for the following:    Squamous Epithelial / LPF 6-30 (*)    Bacteria, UA MANY (*)    All other components within normal limits  LIPASE, BLOOD    EKG  EKG Interpretation None       Radiology Ct Angio Abd/pel W And/or Wo Contrast  Result Date: 05/27/2016 CLINICAL DATA:  Upper abdominal pain for 2 weeks EXAM: CTA ABDOMEN AND PELVIS wITHOUT AND WITH CONTRAST TECHNIQUE: Multidetector CT imaging of the abdomen and pelvis was performed using the standard protocol during bolus administration of intravenous contrast. Multiplanar reconstructed images and MIPs were obtained and reviewed to evaluate the vascular anatomy. CONTRAST:  100 cc Isovue 370 COMPARISON:  03/26/2011 FINDINGS: VASCULAR Aorta: Diffuse atherosclerotic calcifications. There is severe calcified plaque in the infrarenal aorta. Significant narrowing in the aorta may be present. It is non aneurysmal. Celiac: There is calcification at its origin but no significant stenosis. SMA: There is heavy calcification at the origin of the SMA. There is blooming artifact from calcium, however significant stenosis is felt to be unlikely. Renals: 2 right and 2 left renal arteries are patent with atherosclerotic plaque at the origin. IMA: Diminutive and grossly patent. Inflow: Atherosclerotic calcifications in the common iliac artery are present without significant narrowing. Right internal and external iliac  arteries are patent. Moderate calcification of the left common iliac artery without significant narrowing or aneurysmal dilatation. Left internal and external iliac arteries are patent. Proximal Outflow: Grossly patent femoral arteries. Veins: Hepatic, portal, superior mesenteric, and renal veins are patent. Review of the MIP images confirms the above findings. NON-VASCULAR Lower chest: Subsegmental atelectasis at the base of the right middle lobe and dependent lower lobes. Tiny left lower lobe pulmonary nodule on image 4 is stable. Hepatobiliary: Calcified granuloma in the left lobe of the liver. Anterior contour of the left lobe of the liver is slightly nodular. Normal gallbladder. Soft tissue density adjacent to the gallbladder is likely pedunculated hepatic tissue. It is stable. Pancreas: Unremarkable Spleen: Unremarkable Adrenals/Urinary Tract: Kidneys are lobulated bilaterally secondary to scarring. Adrenal glands are unremarkable. Bladder is within normal limits. Stomach/Bowel: No evidence of small-bowel obstruction. Status post right hemicolectomy. No evidence of mass in the colon or at the suture line. Diverticulosis in the transverse and sigmoid colon. Lymphatic: No abnormal retroperitoneal adenopathy. Reproductive: Several calcified uterine fibroids. There is at least 1 pedunculated fibroid. Adnexa are unremarkable. Other: No free-fluid. Musculoskeletal: No vertebral compression deformity. Prominent posterior disc protrusion occurs at L4-5 there is degenerative disc disease at L5-S1. 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 Spinal stenosis is suspected in the lumbar spine. IMPRESSION: VASCULAR There is heavy atherosclerotic calcification but no convincing narrowing of the SMA or celiac to cause mesenteric intestinal ischemia. NON-VASCULAR Post right hemicolectomy Liver is slightly nodular.  Early cirrhosis is not excluded. Degenerative disc disease in the lumbar spine  with spinal stenosis suspected. Electronically Signed   By: Marybelle Killings M.D.   On: 05/27/2016 07:34    Procedures Procedures (including critical care time)  Medications Ordered in ED Medications  iopamidol (ISOVUE-370) 76 % injection (100 mLs  Contrast Given 05/26/16 1502)  ciprofloxacin (CIPRO) tablet 500 mg (500 mg Oral Given 05/26/16 1747)  metroNIDAZOLE (FLAGYL) tablet 500 mg (500 mg Oral Given 05/26/16 1747)     Initial Impression / Assessment and Plan / ED Course  I have reviewed the triage vital signs and the nursing notes.  Pertinent labs & imaging results that were available during my care of the patient were reviewed by me and considered in my medical decision making (see chart for details).  Clinical Course    CT angiogram ordered  to assess cause of patient's symptoms as well as abdominal bruit that was auscultated by myself and the patient's PCP.  CBC shows hemoglobin 8.2, stable chronic anemia. CMP shows glucose of 242, BUN 27, creatinine 1.17. Lipase 31. UA shows 30 protein, moderate leukocytes, many bacteria, 6-30 wbc's. CT angiogram of abdomen and pelvis shows heavy atherosclerotic calcification no convincing narrowing of the SMA or celiac to cause mesenteric intestinal ischemia; post right hemicolectomy; liver is slightly nodular, early cirrhosis is not excluded; degenerative disc disease in the lumbar spine with spinal stenosis suspected. Per recommendation of radiologist that I spoke with over the phone, enteritis was visualized in the proximal small bowel. We'll treat with Cipro, Flagyl and Zofran for symptomatic treatment. Patient to follow up with PCP and gastroenterology. Return precautions discussed. Patient understands and agrees with plan. Patient vitals stable throughout ED course and discharged in satisfactory condition. I discussed case with Dr. Alvino Chapel and Dr. Darl Householder who guided the patient's management and agrees with plan.   Final Clinical Impressions(s) / ED  Diagnoses   Final diagnoses:  Epigastric pain  Non-intractable vomiting with nausea, unspecified vomiting type    New Prescriptions Discharge Medication List as of 05/26/2016  7:09 PM    START taking these medications   Details  ciprofloxacin (CIPRO) 500 MG tablet Take 1 tablet (500 mg total) by mouth 2 (two) times daily., Starting Sun 05/26/2016, Print    metroNIDAZOLE (FLAGYL) 500 MG tablet Take 1 tablet (500 mg total) by mouth 2 (two) times daily., Starting Sun 05/26/2016, Print    ondansetron (ZOFRAN) 4 MG tablet Take 1 tablet (4 mg total) by mouth every 6 (six) hours., Starting Sun 05/26/2016, Print         [id number], PA-C 05/28/16 1610    Davonna Belling, MD 05/29/16 0010

## 2016-05-26 NOTE — Discharge Instructions (Signed)
Medications: Cipro, Flagyl, Zofran  Treatment: Take Cipro Flagyl as prescribed for 2 weeks. Take Zofran every 6 hours as needed for nausea and vomiting. You can take Tylenol as prescribed over-the-counter for your pain.  Follow-up: Please follow-up with your primary care provider for follow-up of today's visit. Follow up with [facility] gastroenterology as needed if your symptoms are not improving after treatment. Please return to the emergency department developed any new or worsening symptoms.

## 2016-05-27 LAB — URINE CULTURE: Special Requests: NORMAL

## 2016-05-31 ENCOUNTER — Other Ambulatory Visit (HOSPITAL_BASED_OUTPATIENT_CLINIC_OR_DEPARTMENT_OTHER): Payer: BC Managed Care – PPO

## 2016-05-31 ENCOUNTER — Ambulatory Visit (HOSPITAL_BASED_OUTPATIENT_CLINIC_OR_DEPARTMENT_OTHER): Payer: BC Managed Care – PPO

## 2016-05-31 VITALS — BP 143/55 | HR 64 | Temp 98.2°F

## 2016-05-31 DIAGNOSIS — D469 Myelodysplastic syndrome, unspecified: Secondary | ICD-10-CM

## 2016-05-31 DIAGNOSIS — D6189 Other specified aplastic anemias and other bone marrow failure syndromes: Secondary | ICD-10-CM

## 2016-05-31 DIAGNOSIS — D631 Anemia in chronic kidney disease: Secondary | ICD-10-CM | POA: Diagnosis not present

## 2016-05-31 DIAGNOSIS — N189 Chronic kidney disease, unspecified: Secondary | ICD-10-CM | POA: Diagnosis not present

## 2016-05-31 DIAGNOSIS — N181 Chronic kidney disease, stage 1: Secondary | ICD-10-CM

## 2016-05-31 LAB — CBC WITH DIFFERENTIAL/PLATELET
BASO%: 0.5 % (ref 0.0–2.0)
Basophils Absolute: 0 10*3/uL (ref 0.0–0.1)
EOS%: 1.2 % (ref 0.0–7.0)
Eosinophils Absolute: 0.1 10*3/uL (ref 0.0–0.5)
HCT: 22.1 % — ABNORMAL LOW (ref 34.8–46.6)
HGB: 7.5 g/dL — ABNORMAL LOW (ref 11.6–15.9)
LYMPH%: 10.3 % — ABNORMAL LOW (ref 14.0–49.7)
MCH: 40.5 pg — ABNORMAL HIGH (ref 25.1–34.0)
MCHC: 34 g/dL (ref 31.5–36.0)
MCV: 119.3 fL — ABNORMAL HIGH (ref 79.5–101.0)
MONO#: 1 10*3/uL — ABNORMAL HIGH (ref 0.1–0.9)
MONO%: 14.2 % — ABNORMAL HIGH (ref 0.0–14.0)
NEUT#: 5.2 10*3/uL (ref 1.5–6.5)
NEUT%: 73.8 % (ref 38.4–76.8)
Platelets: 260 10*3/uL (ref 145–400)
RBC: 1.85 10*6/uL — ABNORMAL LOW (ref 3.70–5.45)
RDW: 12.9 % (ref 11.2–14.5)
WBC: 7 10*3/uL (ref 3.9–10.3)
lymph#: 0.7 10*3/uL — ABNORMAL LOW (ref 0.9–3.3)

## 2016-05-31 MED ORDER — DARBEPOETIN ALFA 300 MCG/0.6ML IJ SOSY
300.0000 ug | PREFILLED_SYRINGE | Freq: Once | INTRAMUSCULAR | Status: AC
  Administered 2016-05-31: 300 ug via SUBCUTANEOUS
  Filled 2016-05-31: qty 0.6

## 2016-05-31 NOTE — Progress Notes (Signed)
Hemoglobin at 7.5 today. Pt denies difficulty breathing, being tired, or having trouble completing ADL's. Results reviewed with Dr. Alen Blew, no new orders given. Aranesp given as ordered.

## 2016-05-31 NOTE — Patient Instructions (Signed)
Darbepoetin Alfa injection What is this medicine? DARBEPOETIN ALFA (dar be POE e tin AL fa) helps your body make more red blood cells. It is used to treat anemia caused by chronic kidney failure and chemotherapy. This medicine may be used for other purposes; ask your health care provider or pharmacist if you have questions. What should I tell my health care provider before I take this medicine? They need to know if you have any of these conditions: -blood clotting disorders or history of blood clots -cancer patient not on chemotherapy -cystic fibrosis -heart disease, such as angina, heart failure, or a history of a heart attack -hemoglobin level of 12 g/dL or greater -high blood pressure -low levels of folate, iron, or vitamin B12 -seizures -an unusual or allergic reaction to darbepoetin, erythropoietin, albumin, hamster proteins, latex, other medicines, foods, dyes, or preservatives -pregnant or trying to get pregnant -breast-feeding How should I use this medicine? This medicine is for injection into a vein or under the skin. It is usually given by a health care professional in a hospital or clinic setting. If you get this medicine at home, you will be taught how to prepare and give this medicine. Do not shake the solution before you withdraw a dose. Use exactly as directed. Take your medicine at regular intervals. Do not take your medicine more often than directed. It is important that you put your used needles and syringes in a special sharps container. Do not put them in a trash can. If you do not have a sharps container, call your pharmacist or healthcare provider to get one. Talk to your pediatrician regarding the use of this medicine in children. While this medicine may be used in children as young as 1 year for selected conditions, precautions do apply. Overdosage: If you think you have taken too much of this medicine contact a poison control center or emergency room at once. NOTE:  This medicine is only for you. Do not share this medicine with others. What if I miss a dose? If you miss a dose, take it as soon as you can. If it is almost time for your next dose, take only that dose. Do not take double or extra doses. What may interact with this medicine? Do not take this medicine with any of the following medications: -epoetin alfa This list may not describe all possible interactions. Give your health care provider a list of all the medicines, herbs, non-prescription drugs, or dietary supplements you use. Also tell them if you smoke, drink alcohol, or use illegal drugs. Some items may interact with your medicine. What should I watch for while using this medicine? Visit your prescriber or health care professional for regular checks on your progress and for the needed blood tests and blood pressure measurements. It is especially important for the doctor to make sure your hemoglobin level is in the desired range, to limit the risk of potential side effects and to give you the best benefit. Keep all appointments for any recommended tests. Check your blood pressure as directed. Ask your doctor what your blood pressure should be and when you should contact him or her. As your body makes more red blood cells, you may need to take iron, folic acid, or vitamin B supplements. Ask your doctor or health care provider which products are right for you. If you have kidney disease continue dietary restrictions, even though this medication can make you feel better. Talk with your doctor or health care professional about the   foods you eat and the vitamins that you take. What side effects may I notice from receiving this medicine? Side effects that you should report to your doctor or health care professional as soon as possible: -allergic reactions like skin rash, itching or hives, swelling of the face, lips, or tongue -breathing problems -changes in vision -chest pain -confusion, trouble speaking  or understanding -feeling faint or lightheaded, falls -high blood pressure -muscle aches or pains -pain, swelling, warmth in the leg -rapid weight gain -severe headaches -sudden numbness or weakness of the face, arm or leg -trouble walking, dizziness, loss of balance or coordination -seizures (convulsions) -swelling of the ankles, feet, hands -unusually weak or tired Side effects that usually do not require medical attention (report to your doctor or health care professional if they continue or are bothersome): -diarrhea -fever, chills (flu-like symptoms) -headaches -nausea, vomiting -redness, stinging, or swelling at site where injected This list may not describe all possible side effects. Call your doctor for medical advice about side effects. You may report side effects to FDA at 1-800-FDA-1088. Where should I keep my medicine? Keep out of the reach of children. Store in a refrigerator between 2 and 8 degrees C (36 and 46 degrees F). Do not freeze. Do not shake. Throw away any unused portion if using a single-dose vial. Throw away any unused medicine after the expiration date. NOTE: This sheet is a summary. It may not cover all possible information. If you have questions about this medicine, talk to your doctor, pharmacist, or health care provider.    2016, Elsevier/Gold Standard. (2008-06-14 10:23:57)  

## 2016-06-21 ENCOUNTER — Ambulatory Visit (HOSPITAL_BASED_OUTPATIENT_CLINIC_OR_DEPARTMENT_OTHER): Payer: BC Managed Care – PPO

## 2016-06-21 ENCOUNTER — Other Ambulatory Visit (HOSPITAL_BASED_OUTPATIENT_CLINIC_OR_DEPARTMENT_OTHER): Payer: BC Managed Care – PPO

## 2016-06-21 VITALS — BP 158/75 | HR 70 | Temp 98.0°F | Resp 18

## 2016-06-21 DIAGNOSIS — N181 Chronic kidney disease, stage 1: Secondary | ICD-10-CM

## 2016-06-21 DIAGNOSIS — D631 Anemia in chronic kidney disease: Secondary | ICD-10-CM

## 2016-06-21 DIAGNOSIS — D469 Myelodysplastic syndrome, unspecified: Secondary | ICD-10-CM | POA: Diagnosis not present

## 2016-06-21 DIAGNOSIS — D6189 Other specified aplastic anemias and other bone marrow failure syndromes: Secondary | ICD-10-CM

## 2016-06-21 DIAGNOSIS — N189 Chronic kidney disease, unspecified: Secondary | ICD-10-CM

## 2016-06-21 LAB — CBC WITH DIFFERENTIAL/PLATELET
BASO%: 0.7 % (ref 0.0–2.0)
Basophils Absolute: 0 10*3/uL (ref 0.0–0.1)
EOS%: 1.8 % (ref 0.0–7.0)
Eosinophils Absolute: 0 10*3/uL (ref 0.0–0.5)
HCT: 22.7 % — ABNORMAL LOW (ref 34.8–46.6)
HGB: 7.6 g/dL — ABNORMAL LOW (ref 11.6–15.9)
LYMPH%: 22.5 % (ref 14.0–49.7)
MCH: 36.3 pg — ABNORMAL HIGH (ref 25.1–34.0)
MCHC: 33.7 g/dL (ref 31.5–36.0)
MCV: 107.9 fL — ABNORMAL HIGH (ref 79.5–101.0)
MONO#: 0.7 10*3/uL (ref 0.1–0.9)
MONO%: 26.6 % — ABNORMAL HIGH (ref 0.0–14.0)
NEUT#: 1.3 10*3/uL — ABNORMAL LOW (ref 1.5–6.5)
NEUT%: 48.4 % (ref 38.4–76.8)
Platelets: 208 10*3/uL (ref 145–400)
RBC: 2.1 10*6/uL — ABNORMAL LOW (ref 3.70–5.45)
RDW: 18 % — ABNORMAL HIGH (ref 11.2–14.5)
WBC: 2.8 10*3/uL — ABNORMAL LOW (ref 3.9–10.3)
lymph#: 0.6 10*3/uL — ABNORMAL LOW (ref 0.9–3.3)

## 2016-06-21 MED ORDER — DARBEPOETIN ALFA 300 MCG/0.6ML IJ SOSY
300.0000 ug | PREFILLED_SYRINGE | Freq: Once | INTRAMUSCULAR | Status: AC
  Administered 2016-06-21: 300 ug via SUBCUTANEOUS
  Filled 2016-06-21: qty 0.6

## 2016-06-21 NOTE — Progress Notes (Signed)
Hemoglobin at 7.6 today. Denies difficulty breathing or completing ADL's. Denies overt low energy levels

## 2016-06-21 NOTE — Patient Instructions (Signed)
Darbepoetin Alfa injection What is this medicine? DARBEPOETIN ALFA (dar be POE e tin AL fa) helps your body make more red blood cells. It is used to treat anemia caused by chronic kidney failure and chemotherapy. This medicine may be used for other purposes; ask your health care provider or pharmacist if you have questions. What should I tell my health care provider before I take this medicine? They need to know if you have any of these conditions: -blood clotting disorders or history of blood clots -cancer patient not on chemotherapy -cystic fibrosis -heart disease, such as angina, heart failure, or a history of a heart attack -hemoglobin level of 12 g/dL or greater -high blood pressure -low levels of folate, iron, or vitamin B12 -seizures -an unusual or allergic reaction to darbepoetin, erythropoietin, albumin, hamster proteins, latex, other medicines, foods, dyes, or preservatives -pregnant or trying to get pregnant -breast-feeding How should I use this medicine? This medicine is for injection into a vein or under the skin. It is usually given by a health care professional in a hospital or clinic setting. If you get this medicine at home, you will be taught how to prepare and give this medicine. Do not shake the solution before you withdraw a dose. Use exactly as directed. Take your medicine at regular intervals. Do not take your medicine more often than directed. It is important that you put your used needles and syringes in a special sharps container. Do not put them in a trash can. If you do not have a sharps container, call your pharmacist or healthcare provider to get one. Talk to your pediatrician regarding the use of this medicine in children. While this medicine may be used in children as young as 1 year for selected conditions, precautions do apply. Overdosage: If you think you have taken too much of this medicine contact a poison control center or emergency room at once. NOTE:  This medicine is only for you. Do not share this medicine with others. What if I miss a dose? If you miss a dose, take it as soon as you can. If it is almost time for your next dose, take only that dose. Do not take double or extra doses. What may interact with this medicine? Do not take this medicine with any of the following medications: -epoetin alfa This list may not describe all possible interactions. Give your health care provider a list of all the medicines, herbs, non-prescription drugs, or dietary supplements you use. Also tell them if you smoke, drink alcohol, or use illegal drugs. Some items may interact with your medicine. What should I watch for while using this medicine? Visit your prescriber or health care professional for regular checks on your progress and for the needed blood tests and blood pressure measurements. It is especially important for the doctor to make sure your hemoglobin level is in the desired range, to limit the risk of potential side effects and to give you the best benefit. Keep all appointments for any recommended tests. Check your blood pressure as directed. Ask your doctor what your blood pressure should be and when you should contact him or her. As your body makes more red blood cells, you may need to take iron, folic acid, or vitamin B supplements. Ask your doctor or health care provider which products are right for you. If you have kidney disease continue dietary restrictions, even though this medication can make you feel better. Talk with your doctor or health care professional about the   foods you eat and the vitamins that you take. What side effects may I notice from receiving this medicine? Side effects that you should report to your doctor or health care professional as soon as possible: -allergic reactions like skin rash, itching or hives, swelling of the face, lips, or tongue -breathing problems -changes in vision -chest pain -confusion, trouble speaking  or understanding -feeling faint or lightheaded, falls -high blood pressure -muscle aches or pains -pain, swelling, warmth in the leg -rapid weight gain -severe headaches -sudden numbness or weakness of the face, arm or leg -trouble walking, dizziness, loss of balance or coordination -seizures (convulsions) -swelling of the ankles, feet, hands -unusually weak or tired Side effects that usually do not require medical attention (report to your doctor or health care professional if they continue or are bothersome): -diarrhea -fever, chills (flu-like symptoms) -headaches -nausea, vomiting -redness, stinging, or swelling at site where injected This list may not describe all possible side effects. Call your doctor for medical advice about side effects. You may report side effects to FDA at 1-800-FDA-1088. Where should I keep my medicine? Keep out of the reach of children. Store in a refrigerator between 2 and 8 degrees C (36 and 46 degrees F). Do not freeze. Do not shake. Throw away any unused portion if using a single-dose vial. Throw away any unused medicine after the expiration date. NOTE: This sheet is a summary. It may not cover all possible information. If you have questions about this medicine, talk to your doctor, pharmacist, or health care provider.    2016, Elsevier/Gold Standard. (2008-06-14 10:23:57)  

## 2016-07-10 ENCOUNTER — Telehealth: Payer: Self-pay | Admitting: Cardiovascular Disease

## 2016-07-10 NOTE — Telephone Encounter (Signed)
Received records from [facility] for appointment on 08/09/16 with Dr Gwenlyn Found.  Records given to [facility] (medical records) for Dr Kennon Holter schedule on 08/09/16. lp

## 2016-07-12 ENCOUNTER — Ambulatory Visit (HOSPITAL_BASED_OUTPATIENT_CLINIC_OR_DEPARTMENT_OTHER): Payer: BC Managed Care – PPO

## 2016-07-12 ENCOUNTER — Other Ambulatory Visit (HOSPITAL_BASED_OUTPATIENT_CLINIC_OR_DEPARTMENT_OTHER): Payer: BC Managed Care – PPO

## 2016-07-12 VITALS — BP 158/55 | HR 69 | Temp 98.0°F | Resp 20

## 2016-07-12 DIAGNOSIS — N181 Chronic kidney disease, stage 1: Secondary | ICD-10-CM

## 2016-07-12 DIAGNOSIS — D6189 Other specified aplastic anemias and other bone marrow failure syndromes: Secondary | ICD-10-CM

## 2016-07-12 DIAGNOSIS — D631 Anemia in chronic kidney disease: Secondary | ICD-10-CM | POA: Diagnosis not present

## 2016-07-12 DIAGNOSIS — D469 Myelodysplastic syndrome, unspecified: Secondary | ICD-10-CM

## 2016-07-12 DIAGNOSIS — N189 Chronic kidney disease, unspecified: Secondary | ICD-10-CM | POA: Diagnosis not present

## 2016-07-12 LAB — CBC WITH DIFFERENTIAL/PLATELET
BASO%: 0.6 % (ref 0.0–2.0)
Basophils Absolute: 0 10*3/uL (ref 0.0–0.1)
EOS%: 1.7 % (ref 0.0–7.0)
Eosinophils Absolute: 0.1 10*3/uL (ref 0.0–0.5)
HCT: 21.6 % — ABNORMAL LOW (ref 34.8–46.6)
HGB: 7.2 g/dL — ABNORMAL LOW (ref 11.6–15.9)
LYMPH%: 22.4 % (ref 14.0–49.7)
MCH: 34.3 pg — ABNORMAL HIGH (ref 25.1–34.0)
MCHC: 33.3 g/dL (ref 31.5–36.0)
MCV: 102.9 fL — ABNORMAL HIGH (ref 79.5–101.0)
MONO#: 0.8 10*3/uL (ref 0.1–0.9)
MONO%: 23.5 % — ABNORMAL HIGH (ref 0.0–14.0)
NEUT#: 1.8 10*3/uL (ref 1.5–6.5)
NEUT%: 51.8 % (ref 38.4–76.8)
Platelets: 275 10*3/uL (ref 145–400)
RBC: 2.1 10*6/uL — ABNORMAL LOW (ref 3.70–5.45)
RDW: 20.4 % — ABNORMAL HIGH (ref 11.2–14.5)
WBC: 3.5 10*3/uL — ABNORMAL LOW (ref 3.9–10.3)
lymph#: 0.8 10*3/uL — ABNORMAL LOW (ref 0.9–3.3)
nRBC: 1 % — ABNORMAL HIGH (ref 0–0)

## 2016-07-12 MED ORDER — DARBEPOETIN ALFA 300 MCG/0.6ML IJ SOSY
300.0000 ug | PREFILLED_SYRINGE | Freq: Once | INTRAMUSCULAR | Status: AC
  Administered 2016-07-12: 300 ug via SUBCUTANEOUS
  Filled 2016-07-12: qty 0.6

## 2016-07-12 NOTE — Patient Instructions (Signed)
Darbepoetin Alfa injection What is this medicine? DARBEPOETIN ALFA (dar be POE e tin AL fa) helps your body make more red blood cells. It is used to treat anemia caused by chronic kidney failure and chemotherapy. This medicine may be used for other purposes; ask your health care provider or pharmacist if you have questions. What should I tell my health care provider before I take this medicine? They need to know if you have any of these conditions: -blood clotting disorders or history of blood clots -cancer patient not on chemotherapy -cystic fibrosis -heart disease, such as angina, heart failure, or a history of a heart attack -hemoglobin level of 12 g/dL or greater -high blood pressure -low levels of folate, iron, or vitamin B12 -seizures -an unusual or allergic reaction to darbepoetin, erythropoietin, albumin, hamster proteins, latex, other medicines, foods, dyes, or preservatives -pregnant or trying to get pregnant -breast-feeding How should I use this medicine? This medicine is for injection into a vein or under the skin. It is usually given by a health care professional in a hospital or clinic setting. If you get this medicine at home, you will be taught how to prepare and give this medicine. Do not shake the solution before you withdraw a dose. Use exactly as directed. Take your medicine at regular intervals. Do not take your medicine more often than directed. It is important that you put your used needles and syringes in a special sharps container. Do not put them in a trash can. If you do not have a sharps container, call your pharmacist or healthcare provider to get one. Talk to your pediatrician regarding the use of this medicine in children. While this medicine may be used in children as young as 1 year for selected conditions, precautions do apply. Overdosage: If you think you have taken too much of this medicine contact a poison control center or emergency room at once. NOTE:  This medicine is only for you. Do not share this medicine with others. What if I miss a dose? If you miss a dose, take it as soon as you can. If it is almost time for your next dose, take only that dose. Do not take double or extra doses. What may interact with this medicine? Do not take this medicine with any of the following medications: -epoetin alfa This list may not describe all possible interactions. Give your health care provider a list of all the medicines, herbs, non-prescription drugs, or dietary supplements you use. Also tell them if you smoke, drink alcohol, or use illegal drugs. Some items may interact with your medicine. What should I watch for while using this medicine? Visit your prescriber or health care professional for regular checks on your progress and for the needed blood tests and blood pressure measurements. It is especially important for the doctor to make sure your hemoglobin level is in the desired range, to limit the risk of potential side effects and to give you the best benefit. Keep all appointments for any recommended tests. Check your blood pressure as directed. Ask your doctor what your blood pressure should be and when you should contact him or her. As your body makes more red blood cells, you may need to take iron, folic acid, or vitamin B supplements. Ask your doctor or health care provider which products are right for you. If you have kidney disease continue dietary restrictions, even though this medication can make you feel better. Talk with your doctor or health care professional about the   foods you eat and the vitamins that you take. What side effects may I notice from receiving this medicine? Side effects that you should report to your doctor or health care professional as soon as possible: -allergic reactions like skin rash, itching or hives, swelling of the face, lips, or tongue -breathing problems -changes in vision -chest pain -confusion, trouble speaking  or understanding -feeling faint or lightheaded, falls -high blood pressure -muscle aches or pains -pain, swelling, warmth in the leg -rapid weight gain -severe headaches -sudden numbness or weakness of the face, arm or leg -trouble walking, dizziness, loss of balance or coordination -seizures (convulsions) -swelling of the ankles, feet, hands -unusually weak or tired Side effects that usually do not require medical attention (report to your doctor or health care professional if they continue or are bothersome): -diarrhea -fever, chills (flu-like symptoms) -headaches -nausea, vomiting -redness, stinging, or swelling at site where injected This list may not describe all possible side effects. Call your doctor for medical advice about side effects. You may report side effects to FDA at 1-800-FDA-1088. Where should I keep my medicine? Keep out of the reach of children. Store in a refrigerator between 2 and 8 degrees C (36 and 46 degrees F). Do not freeze. Do not shake. Throw away any unused portion if using a single-dose vial. Throw away any unused medicine after the expiration date. NOTE: This sheet is a summary. It may not cover all possible information. If you have questions about this medicine, talk to your doctor, pharmacist, or health care provider.    2016, Elsevier/Gold Standard. (2008-06-14 10:23:57)  

## 2016-07-12 NOTE — Progress Notes (Signed)
Pt Hemoglobin 7.2  Today. Denies difficulty breathing or chest pain. States she is able to complete ADL's  As usual. Discussed 7.2 results with Dr. Alen Blew, no new orders given. Injection given as ordered.

## 2016-08-02 ENCOUNTER — Ambulatory Visit: Payer: BC Managed Care – PPO

## 2016-08-02 ENCOUNTER — Ambulatory Visit: Payer: BC Managed Care – PPO | Admitting: Oncology

## 2016-08-02 ENCOUNTER — Other Ambulatory Visit: Payer: BC Managed Care – PPO | Admitting: Lab

## 2016-08-02 ENCOUNTER — Telehealth: Payer: Self-pay | Admitting: Oncology

## 2016-08-02 NOTE — Telephone Encounter (Signed)
Patient called and canceled due to inclement weather and needs to reschedule her appointments her call back number is [phone]

## 2016-08-05 ENCOUNTER — Telehealth: Payer: Self-pay | Admitting: Oncology

## 2016-08-05 NOTE — Telephone Encounter (Signed)
Called patient to confirm appointment per schedule message. Left message on voice mail. 08/05/16

## 2016-08-07 ENCOUNTER — Other Ambulatory Visit: Payer: BC Managed Care – PPO

## 2016-08-07 ENCOUNTER — Ambulatory Visit: Payer: BC Managed Care – PPO

## 2016-08-07 ENCOUNTER — Ambulatory Visit: Payer: BC Managed Care – PPO | Admitting: Oncology

## 2016-08-09 ENCOUNTER — Ambulatory Visit: Payer: BC Managed Care – PPO | Admitting: Cardiovascular Disease

## 2016-08-15 ENCOUNTER — Other Ambulatory Visit (HOSPITAL_BASED_OUTPATIENT_CLINIC_OR_DEPARTMENT_OTHER): Payer: BC Managed Care – PPO

## 2016-08-15 ENCOUNTER — Other Ambulatory Visit: Payer: Self-pay | Admitting: *Deleted

## 2016-08-15 ENCOUNTER — Ambulatory Visit: Payer: BC Managed Care – PPO

## 2016-08-15 ENCOUNTER — Ambulatory Visit (HOSPITAL_COMMUNITY)
Admission: RE | Admit: 2016-08-15 | Discharge: 2016-08-15 | Disposition: A | Discharge status: Home / Self Care | Payer: BC Managed Care – PPO | Source: Ambulatory Visit | Attending: Oncology | Admitting: Oncology

## 2016-08-15 ENCOUNTER — Ambulatory Visit (HOSPITAL_BASED_OUTPATIENT_CLINIC_OR_DEPARTMENT_OTHER): Payer: BC Managed Care – PPO

## 2016-08-15 VITALS — BP 160/59 | HR 68 | Temp 98.8°F | Resp 16

## 2016-08-15 VITALS — BP 164/61 | HR 70 | Temp 98.2°F | Resp 20

## 2016-08-15 DIAGNOSIS — N189 Chronic kidney disease, unspecified: Secondary | ICD-10-CM | POA: Diagnosis not present

## 2016-08-15 DIAGNOSIS — D6189 Other specified aplastic anemias and other bone marrow failure syndromes: Secondary | ICD-10-CM

## 2016-08-15 DIAGNOSIS — N181 Chronic kidney disease, stage 1: Secondary | ICD-10-CM

## 2016-08-15 DIAGNOSIS — D631 Anemia in chronic kidney disease: Secondary | ICD-10-CM

## 2016-08-15 DIAGNOSIS — D649 Anemia, unspecified: Secondary | ICD-10-CM

## 2016-08-15 LAB — CBC WITH DIFFERENTIAL/PLATELET
BASO%: 0.7 % (ref 0.0–2.0)
Basophils Absolute: 0 10*3/uL (ref 0.0–0.1)
EOS%: 0.7 % (ref 0.0–7.0)
Eosinophils Absolute: 0 10*3/uL (ref 0.0–0.5)
HCT: 19.1 % — ABNORMAL LOW (ref 34.8–46.6)
HGB: 6.8 g/dL — CL (ref 11.6–15.9)
LYMPH%: 15.4 % (ref 14.0–49.7)
MCH: 37.2 pg — ABNORMAL HIGH (ref 25.1–34.0)
MCHC: 35.6 g/dL (ref 31.5–36.0)
MCV: 104.4 fL — ABNORMAL HIGH (ref 79.5–101.0)
MONO#: 1.2 10*3/uL — ABNORMAL HIGH (ref 0.1–0.9)
MONO%: 21.1 % — ABNORMAL HIGH (ref 0.0–14.0)
NEUT#: 3.6 10*3/uL (ref 1.5–6.5)
NEUT%: 62.1 % (ref 38.4–76.8)
Platelets: 259 10*3/uL (ref 145–400)
RBC: 1.83 10*6/uL — ABNORMAL LOW (ref 3.70–5.45)
RDW: 25.9 % — ABNORMAL HIGH (ref 11.2–14.5)
WBC: 5.7 10*3/uL (ref 3.9–10.3)
lymph#: 0.9 10*3/uL (ref 0.9–3.3)
nRBC: 1 % — ABNORMAL HIGH (ref 0–0)

## 2016-08-15 LAB — PREPARE RBC (CROSSMATCH)

## 2016-08-15 LAB — ABO/RH: ABO/RH(D): A NEG

## 2016-08-15 MED ORDER — SODIUM CHLORIDE 0.9% FLUSH
10.0000 mL | INTRAVENOUS | Status: DC | PRN
  Filled 2016-08-15: qty 10

## 2016-08-15 MED ORDER — DARBEPOETIN ALFA 300 MCG/0.6ML IJ SOSY
300.0000 ug | PREFILLED_SYRINGE | Freq: Once | INTRAMUSCULAR | Status: AC
  Administered 2016-08-15: 300 ug via SUBCUTANEOUS
  Filled 2016-08-15: qty 0.6

## 2016-08-15 MED ORDER — SODIUM CHLORIDE 0.9 % IV SOLN
250.0000 mL | Freq: Once | INTRAVENOUS | Status: AC
  Administered 2016-08-15: 250 mL via INTRAVENOUS

## 2016-08-15 MED ORDER — DIPHENHYDRAMINE HCL 25 MG PO CAPS
ORAL_CAPSULE | ORAL | Status: AC
  Filled 2016-08-15: qty 1

## 2016-08-15 MED ORDER — ACETAMINOPHEN 325 MG PO TABS
650.0000 mg | ORAL_TABLET | Freq: Once | ORAL | Status: AC
  Administered 2016-08-15: 650 mg via ORAL

## 2016-08-15 MED ORDER — DIPHENHYDRAMINE HCL 25 MG PO CAPS
25.0000 mg | ORAL_CAPSULE | Freq: Once | ORAL | Status: AC
  Administered 2016-08-15: 25 mg via ORAL

## 2016-08-15 MED ORDER — ACETAMINOPHEN 325 MG PO TABS
ORAL_TABLET | ORAL | Status: AC
  Filled 2016-08-15: qty 2

## 2016-08-15 NOTE — Patient Instructions (Signed)
Blood Transfusion , Adult A blood transfusion is a procedure in which you receive donated blood, including plasma, platelets, and red blood cells, through an IV tube. You may need a blood transfusion because of illness, surgery, or injury. The blood may come from a donor. You may also be able to donate blood for yourself (autologous blood donation) before a surgery if you know that you might require a blood transfusion. The blood given in a transfusion is made up of different types of cells. You may receive:  Red blood cells. These carry oxygen to the cells in the body.  White blood cells. These help you fight infections.  Platelets. These help your blood to clot.  Plasma. This is the liquid part of your blood and it helps with fluid imbalances. If you have hemophilia or another clotting disorder, you may also receive other types of blood products. Tell a health care provider about:  Any allergies you have.  All medicines you are taking, including vitamins, herbs, eye drops, creams, and over-the-counter medicines.  Any problems you or family members have had with anesthetic medicines.  Any blood disorders you have.  Any surgeries you have had.  Any medical conditions you have, including any recent fever or cold symptoms.  Whether you are pregnant or may be pregnant.  Any previous reactions you have had during a blood transfusion. What are the risks? Generally, this is a safe procedure. However, problems may occur, including:  Having an allergic reaction to something in the donated blood. Hives and itching may be symptoms of this type of reaction.  Fever. This may be a reaction to the white blood cells in the transfused blood. Nausea or chest pain may accompany a fever.  Iron overload. This can happen from having many transfusions.  Transfusion-related acute lung injury (TRALI). This is a rare reaction that causes lung damage. The cause is not known.TRALI can occur within hours  of a transfusion or several days later.  Sudden (acute) or delayed hemolytic reactions. This happens if your blood does not match the cells in your transfusion. Your body's defense system (immune system) may try to attack the new cells. This complication is rare. The symptoms include fever, chills, nausea, and low back pain or chest pain.  Infection or disease transmission. This is rare. What happens before the procedure?  You will have a blood test to determine your blood type. This is necessary to know what kind of blood your body will accept and to match it to the donor blood.  If you are going to have a planned surgery, you may be able to do an autologous blood donation. This may be done in case you need to have a transfusion.  If you have had an allergic reaction to a transfusion in the past, you may be given medicine to help prevent a reaction. This medicine may be given to you by mouth or through an IV tube.  You will have your temperature, blood pressure, and pulse monitored before the transfusion.  Follow instructions from your health care provider about eating and drinking restrictions.  Ask your health care provider about:  Changing or stopping your regular medicines. This is especially important if you are taking diabetes medicines or blood thinners.  Taking medicines such as aspirin and ibuprofen. These medicines can thin your blood. Do not take these medicines before your procedure if your health care provider instructs you not to. What happens during the procedure?  An IV tube will be   inserted into one of your veins.  The bag of donated blood will be attached to your IV tube. The blood will then enter through your vein.  Your temperature, blood pressure, and pulse will be monitored regularly during the transfusion. This monitoring is done to detect early signs of a transfusion reaction.  If you have any signs or symptoms of a reaction, your transfusion will be stopped and  you may be given medicine.  When the transfusion is complete, your IV tube will be removed.  Pressure may be applied to the IV site for a few minutes.  A bandage (dressing) will be applied. The procedure may vary among health care providers and hospitals. What happens after the procedure?  Your temperature, blood pressure, heart rate, breathing rate, and blood oxygen level will be monitored often.  Your blood may be tested to see how you are responding to the transfusion.  You may be warmed with fluids or blankets to maintain a normal body temperature. Summary  A blood transfusion is a procedure in which you receive donated blood, including plasma, platelets, and red blood cells, through an IV tube.  Your temperature, blood pressure, and pulse will be monitored before, during, and after the transfusion.  Your blood may be tested after the transfusion to see how your body has responded. This information is not intended to replace advice given to you by your health care provider. Make sure you discuss any questions you have with your health care provider. Document Released: 06/28/2000 Document Revised: 03/28/2016 Document Reviewed: 03/28/2016 Elsevier Interactive Patient Education  2017 [employer]  

## 2016-08-15 NOTE — Progress Notes (Signed)
HGB 6.8, pt reports increased fatigue for past 2 weeks. Dyspnea with ambulating short distances. Dr. Alen Blew made aware. MD will arrange for transfusion.

## 2016-08-16 LAB — TYPE AND SCREEN
ABO/RH(D): A NEG
Antibody Screen: NEGATIVE
Unit division: 0
Unit division: 0

## 2016-09-05 ENCOUNTER — Encounter: Payer: Self-pay | Admitting: Oncology

## 2016-09-05 ENCOUNTER — Ambulatory Visit (HOSPITAL_BASED_OUTPATIENT_CLINIC_OR_DEPARTMENT_OTHER): Payer: BC Managed Care – PPO | Admitting: Oncology

## 2016-09-05 ENCOUNTER — Telehealth: Payer: Self-pay | Admitting: Oncology

## 2016-09-05 ENCOUNTER — Other Ambulatory Visit (HOSPITAL_BASED_OUTPATIENT_CLINIC_OR_DEPARTMENT_OTHER): Payer: BC Managed Care – PPO

## 2016-09-05 ENCOUNTER — Ambulatory Visit (HOSPITAL_BASED_OUTPATIENT_CLINIC_OR_DEPARTMENT_OTHER): Payer: BC Managed Care – PPO

## 2016-09-05 VITALS — BP 187/52 | HR 66 | Temp 98.3°F | Resp 18 | Wt 241.5 lb

## 2016-09-05 VITALS — BP 158/70

## 2016-09-05 DIAGNOSIS — D469 Myelodysplastic syndrome, unspecified: Secondary | ICD-10-CM

## 2016-09-05 DIAGNOSIS — D539 Nutritional anemia, unspecified: Secondary | ICD-10-CM

## 2016-09-05 DIAGNOSIS — D6189 Other specified aplastic anemias and other bone marrow failure syndromes: Secondary | ICD-10-CM

## 2016-09-05 DIAGNOSIS — D500 Iron deficiency anemia secondary to blood loss (chronic): Secondary | ICD-10-CM

## 2016-09-05 DIAGNOSIS — N189 Chronic kidney disease, unspecified: Secondary | ICD-10-CM

## 2016-09-05 DIAGNOSIS — D631 Anemia in chronic kidney disease: Secondary | ICD-10-CM

## 2016-09-05 DIAGNOSIS — N181 Chronic kidney disease, stage 1: Secondary | ICD-10-CM

## 2016-09-05 LAB — CBC WITH DIFFERENTIAL/PLATELET
BASO%: 0.8 % (ref 0.0–2.0)
Basophils Absolute: 0 10*3/uL (ref 0.0–0.1)
EOS%: 1.2 % (ref 0.0–7.0)
Eosinophils Absolute: 0 10*3/uL (ref 0.0–0.5)
HCT: 22.2 % — ABNORMAL LOW (ref 34.8–46.6)
HGB: 7.7 g/dL — ABNORMAL LOW (ref 11.6–15.9)
LYMPH%: 13.1 % — ABNORMAL LOW (ref 14.0–49.7)
MCH: 37.7 pg — ABNORMAL HIGH (ref 25.1–34.0)
MCHC: 34.5 g/dL (ref 31.5–36.0)
MCV: 109.1 fL — ABNORMAL HIGH (ref 79.5–101.0)
MONO#: 0.9 10*3/uL (ref 0.1–0.9)
MONO%: 21.5 % — ABNORMAL HIGH (ref 0.0–14.0)
NEUT#: 2.6 10*3/uL (ref 1.5–6.5)
NEUT%: 63.4 % (ref 38.4–76.8)
Platelets: 259 10*3/uL (ref 145–400)
RBC: 2.04 10*6/uL — ABNORMAL LOW (ref 3.70–5.45)
RDW: 24.6 % — ABNORMAL HIGH (ref 11.2–14.5)
WBC: 4 10*3/uL (ref 3.9–10.3)
lymph#: 0.5 10*3/uL — ABNORMAL LOW (ref 0.9–3.3)

## 2016-09-05 MED ORDER — DARBEPOETIN ALFA 300 MCG/0.6ML IJ SOSY
300.0000 ug | PREFILLED_SYRINGE | Freq: Once | INTRAMUSCULAR | Status: AC
  Administered 2016-09-05: 300 ug via SUBCUTANEOUS
  Filled 2016-09-05: qty 0.6

## 2016-09-05 NOTE — Progress Notes (Signed)
Hematology and Oncology Follow Up Visit  Patricia Mata [id number] Aug 11, 1955 61 y.o. 09/05/2016 9:27 AM   Principle Diagnosis: 61 year old with:  1. T3 N1 colon cancer in 10/2006. She had been in remission since the completion of therapy in 2008. 2. 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 Her IPSS-R score was calculated and based on her characteristics she has a score of 2.5.    Prior Therapy: She is S/P hemicolectomy done in 11/2006. 1/13 lymph nodes are positive. (T3N1). She is S/P 12 cycles of FOLFOX completed in 05/2007  Current therapy: Aranesp to 300 g every 3 weeks to keep her hemoglobin above 10.  Interim History:  Patricia Mata return today for a follow up visit. Since the last visit, she developed 1 episode of excessive weakness and fatigue and required packed red cell transfusion 3 weeks ago. She tolerated transfusion well and felt better since. She reports her performance status and activity level is back to baseline. She continues to be active and attends to activities of daily living.  She denied any complications related to Aranesp at this time. She denied any thrombosis or bleeding complications. She does report her blood pressure is within normal range outside of her visits here.  She is not report any headaches blurry vision, syncope or seizures. She does not report any fevers, chills sweats or weight loss. He does not report any chest pain, palpitation orthopnea or leg edema. She does not report any cough, wheezing or hemoptysis. She does not report any nausea, vomiting or abdominal pain. She does not report any frequency urgency or hesitancy. She does not report any skeletal complaints. Remaining review of systems unremarkable.  Medications: I have reviewed the patient's current medications.   Allergies:  Allergies  Allergen Reactions  .  Sulfa Antibiotics Rash and Other (See Comments)    blisters     Physical Exam: Blood pressure (!) 187/52, pulse 66, temperature 98.3 F (36.8 C), temperature source Oral, resp. rate 18, weight 241 lb 8 oz (109.5 kg), SpO2 100 %. ECOG: 0 General appearance: Well appearing woman appeared without distress. Head: Normocephalic, without obvious abnormality no shifting dullness or ascites. Neck: no adenopathy Lymph nodes: Cervical, supraclavicular, and axillary nodes normal. Heart:regular rate and rhythm, S1, S2 normal, no murmur, click, rub or gallop Lung:chest clear, no wheezing, rales, normal symmetric air entry Abdomin: soft, non-tender, without masses or organomegaly no rebound or guarding. EXT:no erythema, induration, or nodules   Lab Results: Lab Results  Component Value Date   WBC 4.0 09/05/2016   HGB 7.7 (L) 09/05/2016   HCT 22.2 (L) 09/05/2016   MCV 109.1 (H) 09/05/2016   PLT 259 09/05/2016     Chemistry      Component Value Date/Time   NA 137 05/26/2016 1235   NA 139 03/08/2016 0909   K 4.8 05/26/2016 1235   K 4.3 03/08/2016 0909   CL 103 05/26/2016 1235   CL 96 (L) 03/31/2012 1513   CO2 24 05/26/2016 1235   CO2 23 03/08/2016 0909   BUN 27 (H) 05/26/2016 1235   BUN 27.3 (H) 03/08/2016 0909   CREATININE 1.17 (H) 05/26/2016 1235   CREATININE 1.1 03/08/2016 0909      Component Value Date/Time   CALCIUM 10.2 05/26/2016 1235   CALCIUM 9.6 03/08/2016 0909   ALKPHOS 53 05/26/2016 1235   ALKPHOS 65 03/08/2016 0909  AST 24 05/26/2016 1235   AST 21 03/08/2016 0909   ALT 25 05/26/2016 1235   ALT 31 03/08/2016 0909   BILITOT 1.1 05/26/2016 1235   BILITOT 1.16 03/08/2016 0909       Impression and Plan:  61 year old female with the following issues:  1. Stage III (T3N1) colon cancer. She is 9 years out from the completion of therapy without any evidence to suggest recurrent disease.  2. Macrocytic anemia: Her hemoglobin currently is 7.5 with an MCV 116. Her  workup did not reveal any vitamin deficiency. She does have an element of renal insufficiency with a creatinine clearance of 66 mL/m.  Her bone marrow biopsy obtained on 02/01/2016 showed hypercellular marrow with the dyspoietic changes. 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 Her cytogenetics did not reveal any specific abnormalities. Significant blasts were not noted. Her IPSS-R score was calculated and based on her characteristics she has a score of 2.5.    She is currently on Aranesp 300 g every 3 weeks. The plan is to continue with the Aranesp at the same dose with every 2 weeks interval. If she continues to require packed red cell transfusions, we will consider alternative therapy options. Referrals for stem cell transplant will also be a consideration as well.     3. Hypertension: Usually her blood pressure is under reasonable control. Elevated today as she is anxious about her appointments.  4. Gout: Follows with her primary care physician regarding this issue and seems to have resolved.  5. Follow-up: Will be in 2 months.    [facility], MD 2/22/20189:27 AM

## 2016-09-05 NOTE — Patient Instructions (Signed)
Darbepoetin Alfa injection What is this medicine? DARBEPOETIN ALFA (dar be POE e tin AL fa) helps your body make more red blood cells. It is used to treat anemia caused by chronic kidney failure and chemotherapy. This medicine may be used for other purposes; ask your health care provider or pharmacist if you have questions. What should I tell my health care provider before I take this medicine? They need to know if you have any of these conditions: -blood clotting disorders or history of blood clots -cancer patient not on chemotherapy -cystic fibrosis -heart disease, such as angina, heart failure, or a history of a heart attack -hemoglobin level of 12 g/dL or greater -high blood pressure -low levels of folate, iron, or vitamin B12 -seizures -an unusual or allergic reaction to darbepoetin, erythropoietin, albumin, hamster proteins, latex, other medicines, foods, dyes, or preservatives -pregnant or trying to get pregnant -breast-feeding How should I use this medicine? This medicine is for injection into a vein or under the skin. It is usually given by a health care professional in a hospital or clinic setting. If you get this medicine at home, you will be taught how to prepare and give this medicine. Do not shake the solution before you withdraw a dose. Use exactly as directed. Take your medicine at regular intervals. Do not take your medicine more often than directed. It is important that you put your used needles and syringes in a special sharps container. Do not put them in a trash can. If you do not have a sharps container, call your pharmacist or healthcare provider to get one. Talk to your pediatrician regarding the use of this medicine in children. While this medicine may be used in children as young as 1 year for selected conditions, precautions do apply. Overdosage: If you think you have taken too much of this medicine contact a poison control center or emergency room at once. NOTE:  This medicine is only for you. Do not share this medicine with others. What if I miss a dose? If you miss a dose, take it as soon as you can. If it is almost time for your next dose, take only that dose. Do not take double or extra doses. What may interact with this medicine? Do not take this medicine with any of the following medications: -epoetin alfa This list may not describe all possible interactions. Give your health care provider a list of all the medicines, herbs, non-prescription drugs, or dietary supplements you use. Also tell them if you smoke, drink alcohol, or use illegal drugs. Some items may interact with your medicine. What should I watch for while using this medicine? Visit your prescriber or health care professional for regular checks on your progress and for the needed blood tests and blood pressure measurements. It is especially important for the doctor to make sure your hemoglobin level is in the desired range, to limit the risk of potential side effects and to give you the best benefit. Keep all appointments for any recommended tests. Check your blood pressure as directed. Ask your doctor what your blood pressure should be and when you should contact him or her. As your body makes more red blood cells, you may need to take iron, folic acid, or vitamin B supplements. Ask your doctor or health care provider which products are right for you. If you have kidney disease continue dietary restrictions, even though this medication can make you feel better. Talk with your doctor or health care professional about the   foods you eat and the vitamins that you take. What side effects may I notice from receiving this medicine? Side effects that you should report to your doctor or health care professional as soon as possible: -allergic reactions like skin rash, itching or hives, swelling of the face, lips, or tongue -breathing problems -changes in vision -chest pain -confusion, trouble speaking  or understanding -feeling faint or lightheaded, falls -high blood pressure -muscle aches or pains -pain, swelling, warmth in the leg -rapid weight gain -severe headaches -sudden numbness or weakness of the face, arm or leg -trouble walking, dizziness, loss of balance or coordination -seizures (convulsions) -swelling of the ankles, feet, hands -unusually weak or tired Side effects that usually do not require medical attention (report to your doctor or health care professional if they continue or are bothersome): -diarrhea -fever, chills (flu-like symptoms) -headaches -nausea, vomiting -redness, stinging, or swelling at site where injected This list may not describe all possible side effects. Call your doctor for medical advice about side effects. You may report side effects to FDA at 1-800-FDA-1088. Where should I keep my medicine? Keep out of the reach of children. Store in a refrigerator between 2 and 8 degrees C (36 and 46 degrees F). Do not freeze. Do not shake. Throw away any unused portion if using a single-dose vial. Throw away any unused medicine after the expiration date. NOTE: This sheet is a summary. It may not cover all possible information. If you have questions about this medicine, talk to your doctor, pharmacist, or health care provider.    2016, Elsevier/Gold Standard. (2008-06-14 10:23:57)  

## 2016-09-05 NOTE — Telephone Encounter (Signed)
Gave patient avs report and appointments for March and April.  °

## 2016-09-12 ENCOUNTER — Ambulatory Visit (HOSPITAL_COMMUNITY)
Admission: RE | Admit: 2016-09-12 | Discharge: 2016-09-12 | Disposition: A | Discharge status: Home / Self Care | Payer: BC Managed Care – PPO | Source: Ambulatory Visit | Attending: Oncology | Admitting: Oncology

## 2016-09-12 DIAGNOSIS — D631 Anemia in chronic kidney disease: Secondary | ICD-10-CM

## 2016-09-12 DIAGNOSIS — N189 Chronic kidney disease, unspecified: Secondary | ICD-10-CM | POA: Insufficient documentation

## 2016-09-19 ENCOUNTER — Ambulatory Visit: Payer: BC Managed Care – PPO

## 2016-09-19 ENCOUNTER — Other Ambulatory Visit: Payer: Self-pay | Admitting: Oncology

## 2016-09-19 ENCOUNTER — Other Ambulatory Visit: Payer: Self-pay | Admitting: *Deleted

## 2016-09-19 ENCOUNTER — Ambulatory Visit (HOSPITAL_BASED_OUTPATIENT_CLINIC_OR_DEPARTMENT_OTHER): Payer: BC Managed Care – PPO

## 2016-09-19 ENCOUNTER — Other Ambulatory Visit (HOSPITAL_BASED_OUTPATIENT_CLINIC_OR_DEPARTMENT_OTHER): Payer: BC Managed Care – PPO

## 2016-09-19 VITALS — BP 194/72 | HR 71 | Temp 98.2°F | Resp 17

## 2016-09-19 DIAGNOSIS — D631 Anemia in chronic kidney disease: Secondary | ICD-10-CM

## 2016-09-19 DIAGNOSIS — N189 Chronic kidney disease, unspecified: Secondary | ICD-10-CM | POA: Diagnosis present

## 2016-09-19 DIAGNOSIS — D469 Myelodysplastic syndrome, unspecified: Secondary | ICD-10-CM

## 2016-09-19 DIAGNOSIS — D500 Iron deficiency anemia secondary to blood loss (chronic): Secondary | ICD-10-CM

## 2016-09-19 DIAGNOSIS — N181 Chronic kidney disease, stage 1: Secondary | ICD-10-CM

## 2016-09-19 DIAGNOSIS — D539 Nutritional anemia, unspecified: Secondary | ICD-10-CM

## 2016-09-19 LAB — CBC WITH DIFFERENTIAL/PLATELET
BASO%: 0.3 % (ref 0.0–2.0)
Basophils Absolute: 0 10*3/uL (ref 0.0–0.1)
EOS%: 1.1 % (ref 0.0–7.0)
Eosinophils Absolute: 0 10*3/uL (ref 0.0–0.5)
HCT: 22.1 % — ABNORMAL LOW (ref 34.8–46.6)
HGB: 7.4 g/dL — ABNORMAL LOW (ref 11.6–15.9)
LYMPH%: 15.5 % (ref 14.0–49.7)
MCH: 37 pg — ABNORMAL HIGH (ref 25.1–34.0)
MCHC: 33.5 g/dL (ref 31.5–36.0)
MCV: 110.5 fL — ABNORMAL HIGH (ref 79.5–101.0)
MONO#: 0.9 10*3/uL (ref 0.1–0.9)
MONO%: 23.9 % — ABNORMAL HIGH (ref 0.0–14.0)
NEUT#: 2.3 10*3/uL (ref 1.5–6.5)
NEUT%: 59.2 % (ref 38.4–76.8)
Platelets: 229 10*3/uL (ref 145–400)
RBC: 2 10*6/uL — ABNORMAL LOW (ref 3.70–5.45)
RDW: 22.1 % — ABNORMAL HIGH (ref 11.2–14.5)
WBC: 3.8 10*3/uL — ABNORMAL LOW (ref 3.9–10.3)
lymph#: 0.6 10*3/uL — ABNORMAL LOW (ref 0.9–3.3)

## 2016-09-19 LAB — IRON AND TIBC
%SAT: >100 % (ref 21–?)
Iron: 243 ug/dL — ABNORMAL HIGH (ref 41–142)
TIBC: 236 ug/dL (ref 236–444)
UIBC: <1 ug/dL (ref 120–384)

## 2016-09-19 LAB — PREPARE RBC (CROSSMATCH)

## 2016-09-19 LAB — FERRITIN: Ferritin: 319 ng/ml — ABNORMAL HIGH (ref 9–269)

## 2016-09-19 MED ORDER — DARBEPOETIN ALFA 300 MCG/0.6ML IJ SOSY
300.0000 ug | PREFILLED_SYRINGE | Freq: Once | INTRAMUSCULAR | Status: AC
  Administered 2016-09-19: 300 ug via SUBCUTANEOUS
  Filled 2016-09-19: qty 0.6

## 2016-09-19 MED ORDER — DIPHENHYDRAMINE HCL 25 MG PO CAPS
ORAL_CAPSULE | ORAL | Status: AC
  Filled 2016-09-19: qty 1

## 2016-09-19 MED ORDER — ACETAMINOPHEN 325 MG PO TABS
650.0000 mg | ORAL_TABLET | Freq: Once | ORAL | Status: AC
  Administered 2016-09-19: 650 mg via ORAL

## 2016-09-19 MED ORDER — ACETAMINOPHEN 325 MG PO TABS
ORAL_TABLET | ORAL | Status: AC
  Filled 2016-09-19: qty 2

## 2016-09-19 MED ORDER — DARBEPOETIN ALFA 300 MCG/0.6ML IJ SOSY: 300.0000 ug | PREFILLED_SYRINGE | Freq: Once | INTRAMUSCULAR | Status: DC

## 2016-09-19 MED ORDER — DIPHENHYDRAMINE HCL 25 MG PO CAPS
25.0000 mg | ORAL_CAPSULE | Freq: Once | ORAL | Status: AC
  Administered 2016-09-19: 25 mg via ORAL

## 2016-09-19 MED ORDER — SODIUM CHLORIDE 0.9 % IV SOLN
250.0000 mL | Freq: Once | INTRAVENOUS | Status: AC
  Administered 2016-09-19: 250 mL via INTRAVENOUS

## 2016-09-19 NOTE — Patient Instructions (Addendum)
Anemia, Nonspecific Anemia is a condition in which the concentration of red blood cells or hemoglobin in the blood is below normal. Hemoglobin is a substance in red blood cells that carries oxygen to the tissues of the body. Anemia results in not enough oxygen reaching these tissues. What are the causes? Common causes of anemia include:  Excessive bleeding. Bleeding may be internal or external. This includes excessive bleeding from periods (in women) or from the intestine.  Poor nutrition.  Chronic kidney, thyroid, and liver disease.  Bone marrow disorders that decrease red blood cell production.  Cancer and treatments for cancer.  HIV, AIDS, and their treatments.  Spleen problems that increase red blood cell destruction.  Blood disorders.  Excess destruction of red blood cells due to infection, medicines, and autoimmune disorders. What are the signs or symptoms?  Minor weakness.  Dizziness.  Headache.  Palpitations.  Shortness of breath, especially with exercise.  Paleness.  Cold sensitivity.  Indigestion.  Nausea.  Difficulty sleeping.  Difficulty concentrating. Symptoms may occur suddenly or they may develop slowly. How is this diagnosed? Additional blood tests are often needed. These help your health care provider determine the best treatment. Your health care provider will check your stool for blood and look for other causes of blood loss. How is this treated? Treatment varies depending on the cause of the anemia. Treatment can include:  Supplements of iron, vitamin J09, or folic acid.  Hormone medicines.  A blood transfusion. This may be needed if blood loss is severe.  Hospitalization. This may be needed if there is significant continual blood loss.  Dietary changes.  Spleen removal. Follow these instructions at home: Keep all follow-up appointments. It often takes many weeks to correct anemia, and having your health care provider check on your  condition and your response to treatment is very important. Get help right away if:  You develop extreme weakness, shortness of breath, or chest pain.  You become dizzy or have trouble concentrating.  You develop heavy vaginal bleeding.  You develop a rash.  You have bloody or black, tarry stools.  You faint.  You vomit up blood.  You vomit repeatedly.  You have abdominal pain.  You have a fever or persistent symptoms for more than 2-3 days.  You have a fever and your symptoms suddenly get worse.  You are dehydrated. This information is not intended to replace advice given to you by your health care provider. Make sure you discuss any questions you have with your health care provider. Document Released: 08/08/2004 Document Revised: 12/13/2015 Document Reviewed: 12/25/2012 Elsevier Interactive Patient Education  2017 [place].     Blood Transfusion, Care After This sheet gives you information about how to care for yourself after your procedure. Your doctor may also give you more specific instructions. If you have problems or questions, contact your doctor. Follow these instructions at home:  Take over-the-counter and prescription medicines only as told by your doctor.  Go back to your normal activities as told by your doctor.  Follow instructions from your doctor about how to take care of the area where an IV tube was put into your vein (insertion site). Make sure you:  Wash your hands with soap and water before you change your bandage (dressing). If there is no soap and water, use hand sanitizer.  Change your bandage as told by your doctor.  Check your IV insertion site every day for signs of infection. Check for:  More redness, swelling, or pain.  More fluid or blood.  Warmth.  Pus or a bad smell. Contact a doctor if:  You have more redness, swelling, or pain around the IV insertion site..  You have more fluid or blood coming from the IV insertion  site.  Your IV insertion site feels warm to the touch.  You have pus or a bad smell coming from the IV insertion site.  Your pee (urine) turns pink, red, or brown.  You feel weak after doing your normal activities. Get help right away if:  You have signs of a serious allergic or body defense (immune) system reaction, including:  Itchiness.  Hives.  Trouble breathing.  Anxiety.  Pain in your chest or lower back.  Fever, flushing, and chills.  Fast pulse.  Rash.  Watery poop (diarrhea).  Throwing up (vomiting).  Dark pee.  Serious headache.  Dizziness.  Stiff neck.  Yellow color in your face or the white parts of your eyes (jaundice). Summary  After a blood transfusion, return to your normal activities as told by your doctor.  Every day, check for signs of infection where the IV tube was put into your vein.  Some signs of infection are warm skin, more redness and pain, more fluid or blood, and pus or a bad smell where the needle went in.  Contact your doctor if you feel weak or have any unusual symptoms. This information is not intended to replace advice given to you by your health care provider. Make sure you discuss any questions you have with your health care provider. Document Released: 07/22/2014 Document Revised: 02/23/2016 Document Reviewed: 02/23/2016 Elsevier Interactive Patient Education  2017 [employer].

## 2016-09-20 LAB — TYPE AND SCREEN
ABO/RH(D): A NEG
Antibody Screen: NEGATIVE
Unit division: 0
Unit division: 0

## 2016-09-20 LAB — BPAM RBC
Blood Product Expiration Date: 201803222359
Blood Product Expiration Date: 201803242359
ISSUE DATE / TIME: 201803081109
ISSUE DATE / TIME: 201803081109
Unit Type and Rh: 600
Unit Type and Rh: 600

## 2016-10-03 ENCOUNTER — Other Ambulatory Visit (HOSPITAL_BASED_OUTPATIENT_CLINIC_OR_DEPARTMENT_OTHER): Payer: BC Managed Care – PPO

## 2016-10-03 ENCOUNTER — Ambulatory Visit (HOSPITAL_BASED_OUTPATIENT_CLINIC_OR_DEPARTMENT_OTHER): Payer: BC Managed Care – PPO

## 2016-10-03 ENCOUNTER — Telehealth: Payer: Self-pay | Admitting: *Deleted

## 2016-10-03 VITALS — BP 149/55 | HR 62 | Temp 97.8°F | Resp 18

## 2016-10-03 DIAGNOSIS — D631 Anemia in chronic kidney disease: Secondary | ICD-10-CM

## 2016-10-03 DIAGNOSIS — D500 Iron deficiency anemia secondary to blood loss (chronic): Secondary | ICD-10-CM

## 2016-10-03 DIAGNOSIS — N181 Chronic kidney disease, stage 1: Secondary | ICD-10-CM

## 2016-10-03 LAB — CBC WITH DIFFERENTIAL/PLATELET
BASO%: 0.4 % (ref 0.0–2.0)
Basophils Absolute: 0 10*3/uL (ref 0.0–0.1)
EOS%: 1.3 % (ref 0.0–7.0)
Eosinophils Absolute: 0 10*3/uL (ref 0.0–0.5)
HCT: 23.2 % — ABNORMAL LOW (ref 34.8–46.6)
HGB: 7.9 g/dL — ABNORMAL LOW (ref 11.6–15.9)
LYMPH%: 34.5 % (ref 14.0–49.7)
MCH: 36.2 pg — ABNORMAL HIGH (ref 25.1–34.0)
MCHC: 34.1 g/dL (ref 31.5–36.0)
MCV: 106.4 fL — ABNORMAL HIGH (ref 79.5–101.0)
MONO#: 0.7 10*3/uL (ref 0.1–0.9)
MONO%: 27.3 % — ABNORMAL HIGH (ref 0.0–14.0)
NEUT#: 0.9 10*3/uL — ABNORMAL LOW (ref 1.5–6.5)
NEUT%: 36.5 % — ABNORMAL LOW (ref 38.4–76.8)
Platelets: 183 10*3/uL (ref 145–400)
RBC: 2.18 10*6/uL — ABNORMAL LOW (ref 3.70–5.45)
RDW: 22.6 % — ABNORMAL HIGH (ref 11.2–14.5)
WBC: 2.4 10*3/uL — ABNORMAL LOW (ref 3.9–10.3)
lymph#: 0.8 10*3/uL — ABNORMAL LOW (ref 0.9–3.3)

## 2016-10-03 MED ORDER — DARBEPOETIN ALFA 300 MCG/0.6ML IJ SOSY
300.0000 ug | PREFILLED_SYRINGE | Freq: Once | INTRAMUSCULAR | Status: AC
  Administered 2016-10-03: 300 ug via SUBCUTANEOUS
  Filled 2016-10-03: qty 0.6

## 2016-10-03 NOTE — Patient Instructions (Signed)
Darbepoetin Alfa injection What is this medicine? DARBEPOETIN ALFA (dar be POE e tin AL fa) helps your body make more red blood cells. It is used to treat anemia caused by chronic kidney failure and chemotherapy. This medicine may be used for other purposes; ask your health care provider or pharmacist if you have questions. What should I tell my health care provider before I take this medicine? They need to know if you have any of these conditions: -blood clotting disorders or history of blood clots -cancer patient not on chemotherapy -cystic fibrosis -heart disease, such as angina, heart failure, or a history of a heart attack -hemoglobin level of 12 g/dL or greater -high blood pressure -low levels of folate, iron, or vitamin B12 -seizures -an unusual or allergic reaction to darbepoetin, erythropoietin, albumin, hamster proteins, latex, other medicines, foods, dyes, or preservatives -pregnant or trying to get pregnant -breast-feeding How should I use this medicine? This medicine is for injection into a vein or under the skin. It is usually given by a health care professional in a hospital or clinic setting. If you get this medicine at home, you will be taught how to prepare and give this medicine. Do not shake the solution before you withdraw a dose. Use exactly as directed. Take your medicine at regular intervals. Do not take your medicine more often than directed. It is important that you put your used needles and syringes in a special sharps container. Do not put them in a trash can. If you do not have a sharps container, call your pharmacist or healthcare provider to get one. Talk to your pediatrician regarding the use of this medicine in children. While this medicine may be used in children as young as 1 year for selected conditions, precautions do apply. Overdosage: If you think you have taken too much of this medicine contact a poison control center or emergency room at once. NOTE:  This medicine is only for you. Do not share this medicine with others. What if I miss a dose? If you miss a dose, take it as soon as you can. If it is almost time for your next dose, take only that dose. Do not take double or extra doses. What may interact with this medicine? Do not take this medicine with any of the following medications: -epoetin alfa This list may not describe all possible interactions. Give your health care provider a list of all the medicines, herbs, non-prescription drugs, or dietary supplements you use. Also tell them if you smoke, drink alcohol, or use illegal drugs. Some items may interact with your medicine. What should I watch for while using this medicine? Visit your prescriber or health care professional for regular checks on your progress and for the needed blood tests and blood pressure measurements. It is especially important for the doctor to make sure your hemoglobin level is in the desired range, to limit the risk of potential side effects and to give you the best benefit. Keep all appointments for any recommended tests. Check your blood pressure as directed. Ask your doctor what your blood pressure should be and when you should contact him or her. As your body makes more red blood cells, you may need to take iron, folic acid, or vitamin B supplements. Ask your doctor or health care provider which products are right for you. If you have kidney disease continue dietary restrictions, even though this medication can make you feel better. Talk with your doctor or health care professional about the   foods you eat and the vitamins that you take. What side effects may I notice from receiving this medicine? Side effects that you should report to your doctor or health care professional as soon as possible: -allergic reactions like skin rash, itching or hives, swelling of the face, lips, or tongue -breathing problems -changes in vision -chest pain -confusion, trouble speaking  or understanding -feeling faint or lightheaded, falls -high blood pressure -muscle aches or pains -pain, swelling, warmth in the leg -rapid weight gain -severe headaches -sudden numbness or weakness of the face, arm or leg -trouble walking, dizziness, loss of balance or coordination -seizures (convulsions) -swelling of the ankles, feet, hands -unusually weak or tired Side effects that usually do not require medical attention (report to your doctor or health care professional if they continue or are bothersome): -diarrhea -fever, chills (flu-like symptoms) -headaches -nausea, vomiting -redness, stinging, or swelling at site where injected This list may not describe all possible side effects. Call your doctor for medical advice about side effects. You may report side effects to FDA at 1-800-FDA-1088. Where should I keep my medicine? Keep out of the reach of children. Store in a refrigerator between 2 and 8 degrees C (36 and 46 degrees F). Do not freeze. Do not shake. Throw away any unused portion if using a single-dose vial. Throw away any unused medicine after the expiration date. NOTE: This sheet is a summary. It may not cover all possible information. If you have questions about this medicine, talk to your doctor, pharmacist, or health care provider.    2016, Elsevier/Gold Standard. (2008-06-14 10:23:57)  

## 2016-10-03 NOTE — Telephone Encounter (Signed)
This RN called patient informing her that her hemoglobin is 7.9 and that Dr. Alen Blew would like to give her two units of blood. Patient stated,"I feel fine. I'm not tired, fatigued, no shortness of breath, chest pain. I would rather wait and if I start to feel bad I will call the office." I told her I would inform Dr. Alen Blew of her decision. Patient verbalized understanding.

## 2016-10-03 NOTE — Progress Notes (Signed)
Hemoglobin at 7.9, denies difficulty breathing or tiredness, states she feels well.

## 2016-10-17 ENCOUNTER — Ambulatory Visit (HOSPITAL_COMMUNITY)
Admission: RE | Admit: 2016-10-17 | Discharge: 2016-10-17 | Disposition: A | Discharge status: Home / Self Care | Payer: BC Managed Care – PPO | Source: Ambulatory Visit | Attending: Oncology | Admitting: Oncology

## 2016-10-17 ENCOUNTER — Other Ambulatory Visit: Payer: Self-pay

## 2016-10-17 ENCOUNTER — Other Ambulatory Visit: Payer: Self-pay | Admitting: *Deleted

## 2016-10-17 ENCOUNTER — Ambulatory Visit (HOSPITAL_BASED_OUTPATIENT_CLINIC_OR_DEPARTMENT_OTHER): Payer: BC Managed Care – PPO

## 2016-10-17 ENCOUNTER — Other Ambulatory Visit (HOSPITAL_BASED_OUTPATIENT_CLINIC_OR_DEPARTMENT_OTHER): Payer: BC Managed Care – PPO

## 2016-10-17 VITALS — BP 166/53 | HR 73 | Temp 98.2°F | Resp 18

## 2016-10-17 DIAGNOSIS — N189 Chronic kidney disease, unspecified: Secondary | ICD-10-CM | POA: Diagnosis present

## 2016-10-17 DIAGNOSIS — D631 Anemia in chronic kidney disease: Secondary | ICD-10-CM

## 2016-10-17 DIAGNOSIS — D500 Iron deficiency anemia secondary to blood loss (chronic): Secondary | ICD-10-CM

## 2016-10-17 DIAGNOSIS — N181 Chronic kidney disease, stage 1: Secondary | ICD-10-CM | POA: Diagnosis not present

## 2016-10-17 LAB — PREPARE RBC (CROSSMATCH)

## 2016-10-17 LAB — CBC WITH DIFFERENTIAL/PLATELET
BASO%: 0.4 % (ref 0.0–2.0)
Basophils Absolute: 0 10*3/uL (ref 0.0–0.1)
EOS%: 0.7 % (ref 0.0–7.0)
Eosinophils Absolute: 0 10*3/uL (ref 0.0–0.5)
HCT: 19.8 % — ABNORMAL LOW (ref 34.8–46.6)
HGB: 6.6 g/dL — CL (ref 11.6–15.9)
LYMPH%: 13.9 % — ABNORMAL LOW (ref 14.0–49.7)
MCH: 36.3 pg — ABNORMAL HIGH (ref 25.1–34.0)
MCHC: 33.3 g/dL (ref 31.5–36.0)
MCV: 108.8 fL — ABNORMAL HIGH (ref 79.5–101.0)
MONO#: 1 10*3/uL — ABNORMAL HIGH (ref 0.1–0.9)
MONO%: 23.1 % — ABNORMAL HIGH (ref 0.0–14.0)
NEUT#: 2.8 10*3/uL (ref 1.5–6.5)
NEUT%: 61.9 % (ref 38.4–76.8)
Platelets: 213 10*3/uL (ref 145–400)
RBC: 1.82 10*6/uL — ABNORMAL LOW (ref 3.70–5.45)
WBC: 4.5 10*3/uL (ref 3.9–10.3)
lymph#: 0.6 10*3/uL — ABNORMAL LOW (ref 0.9–3.3)

## 2016-10-17 MED ORDER — SODIUM CHLORIDE 0.9 % IV SOLN
250.0000 mL | Freq: Once | INTRAVENOUS | Status: AC
  Administered 2016-10-17: 250 mL via INTRAVENOUS

## 2016-10-17 MED ORDER — DARBEPOETIN ALFA 300 MCG/0.6ML IJ SOSY
300.0000 ug | PREFILLED_SYRINGE | Freq: Once | INTRAMUSCULAR | Status: AC
  Administered 2016-10-17: 300 ug via SUBCUTANEOUS
  Filled 2016-10-17: qty 0.6

## 2016-10-17 MED ORDER — DIPHENHYDRAMINE HCL 25 MG PO CAPS
25.0000 mg | ORAL_CAPSULE | Freq: Once | ORAL | Status: AC
  Administered 2016-10-17: 25 mg via ORAL
  Filled 2016-10-17: qty 1

## 2016-10-17 MED ORDER — ACETAMINOPHEN 325 MG PO TABS
650.0000 mg | ORAL_TABLET | Freq: Once | ORAL | Status: AC
  Administered 2016-10-17: 650 mg via ORAL
  Filled 2016-10-17: qty 2

## 2016-10-17 NOTE — Progress Notes (Signed)
Provider: Dahlia Byes  Diagnosis Association: Anemia in chronic kidney disease, unspecified CKD stage (N18.9 , D63.1)  Treatment: 2 units of PRBC's via port a cath  Patient received 2 unit of PRBC's via port a cath. Patient tolerated procedure well with no transfusion reaction. Patient alert, oriented and ambulatory at time of discharge. Discharge instructions given to patient and patient states an understanding.

## 2016-10-17 NOTE — Patient Instructions (Signed)

## 2016-10-17 NOTE — Progress Notes (Addendum)
Dr. Alen Blew paged about patient's blood pressure at this time. Patient states she took her home medication this morning. Patient reports that she is feeling fine and is asystematic. Will await on phone call and continue to monitor patient's BP.    Update:1330 Dr Alen Blew advised RN to complete infusion and believes it is due to volume and anxiety.

## 2016-10-17 NOTE — Discharge Instructions (Signed)

## 2016-10-18 LAB — TYPE AND SCREEN
ABO/RH(D): A NEG
Antibody Screen: NEGATIVE
Unit division: 0
Unit division: 0

## 2016-10-18 LAB — BPAM RBC
Blood Product Expiration Date: 201805012359
Blood Product Expiration Date: 201805012359
ISSUE DATE / TIME: 201804051016
ISSUE DATE / TIME: 201804051016
Unit Type and Rh: 600
Unit Type and Rh: 600

## 2016-10-31 ENCOUNTER — Other Ambulatory Visit (HOSPITAL_BASED_OUTPATIENT_CLINIC_OR_DEPARTMENT_OTHER): Payer: BC Managed Care – PPO

## 2016-10-31 ENCOUNTER — Ambulatory Visit (HOSPITAL_BASED_OUTPATIENT_CLINIC_OR_DEPARTMENT_OTHER): Payer: BC Managed Care – PPO | Admitting: Oncology

## 2016-10-31 ENCOUNTER — Telehealth: Payer: Self-pay | Admitting: Oncology

## 2016-10-31 ENCOUNTER — Ambulatory Visit (HOSPITAL_BASED_OUTPATIENT_CLINIC_OR_DEPARTMENT_OTHER): Payer: BC Managed Care – PPO

## 2016-10-31 VITALS — BP 187/57 | HR 79 | Temp 98.2°F | Resp 18 | Ht 63.0 in | Wt 231.9 lb

## 2016-10-31 DIAGNOSIS — D469 Myelodysplastic syndrome, unspecified: Secondary | ICD-10-CM | POA: Diagnosis not present

## 2016-10-31 DIAGNOSIS — N181 Chronic kidney disease, stage 1: Secondary | ICD-10-CM

## 2016-10-31 DIAGNOSIS — D539 Nutritional anemia, unspecified: Secondary | ICD-10-CM

## 2016-10-31 DIAGNOSIS — D631 Anemia in chronic kidney disease: Secondary | ICD-10-CM | POA: Diagnosis not present

## 2016-10-31 DIAGNOSIS — Z85038 Personal history of other malignant neoplasm of large intestine: Secondary | ICD-10-CM

## 2016-10-31 DIAGNOSIS — D500 Iron deficiency anemia secondary to blood loss (chronic): Secondary | ICD-10-CM

## 2016-10-31 DIAGNOSIS — I10 Essential (primary) hypertension: Secondary | ICD-10-CM

## 2016-10-31 LAB — CBC WITH DIFFERENTIAL/PLATELET
BASO%: 1.1 % (ref 0.0–2.0)
Basophils Absolute: 0 10*3/uL (ref 0.0–0.1)
EOS%: 1.3 % (ref 0.0–7.0)
Eosinophils Absolute: 0 10*3/uL (ref 0.0–0.5)
HCT: 24.6 % — ABNORMAL LOW (ref 34.8–46.6)
HGB: 8.5 g/dL — ABNORMAL LOW (ref 11.6–15.9)
LYMPH%: 18.7 % (ref 14.0–49.7)
MCH: 36.8 pg — ABNORMAL HIGH (ref 25.1–34.0)
MCHC: 34.4 g/dL (ref 31.5–36.0)
MCV: 107.1 fL — ABNORMAL HIGH (ref 79.5–101.0)
MONO#: 0.7 10*3/uL (ref 0.1–0.9)
MONO%: 23.8 % — ABNORMAL HIGH (ref 0.0–14.0)
NEUT#: 1.7 10*3/uL (ref 1.5–6.5)
NEUT%: 55.1 % (ref 38.4–76.8)
Platelets: 245 10*3/uL (ref 145–400)
RBC: 2.3 10*6/uL — ABNORMAL LOW (ref 3.70–5.45)
RDW: 26.8 % — ABNORMAL HIGH (ref 11.2–14.5)
WBC: 3.1 10*3/uL — ABNORMAL LOW (ref 3.9–10.3)
lymph#: 0.6 10*3/uL — ABNORMAL LOW (ref 0.9–3.3)

## 2016-10-31 MED ORDER — DARBEPOETIN ALFA 300 MCG/0.6ML IJ SOSY
300.0000 ug | PREFILLED_SYRINGE | Freq: Once | INTRAMUSCULAR | Status: AC
  Administered 2016-10-31: 300 ug via SUBCUTANEOUS
  Filled 2016-10-31: qty 0.6

## 2016-10-31 NOTE — Telephone Encounter (Signed)
Appointments scheduled per 4.19.18 LOS. Patient given AVS report and calendars with future scheduled appointments. °

## 2016-10-31 NOTE — Patient Instructions (Signed)
Darbepoetin Alfa injection What is this medicine? DARBEPOETIN ALFA (dar be POE e tin AL fa) helps your body make more red blood cells. It is used to treat anemia caused by chronic kidney failure and chemotherapy. This medicine may be used for other purposes; ask your health care provider or pharmacist if you have questions. What should I tell my health care provider before I take this medicine? They need to know if you have any of these conditions: -blood clotting disorders or history of blood clots -cancer patient not on chemotherapy -cystic fibrosis -heart disease, such as angina, heart failure, or a history of a heart attack -hemoglobin level of 12 g/dL or greater -high blood pressure -low levels of folate, iron, or vitamin B12 -seizures -an unusual or allergic reaction to darbepoetin, erythropoietin, albumin, hamster proteins, latex, other medicines, foods, dyes, or preservatives -pregnant or trying to get pregnant -breast-feeding How should I use this medicine? This medicine is for injection into a vein or under the skin. It is usually given by a health care professional in a hospital or clinic setting. If you get this medicine at home, you will be taught how to prepare and give this medicine. Do not shake the solution before you withdraw a dose. Use exactly as directed. Take your medicine at regular intervals. Do not take your medicine more often than directed. It is important that you put your used needles and syringes in a special sharps container. Do not put them in a trash can. If you do not have a sharps container, call your pharmacist or healthcare provider to get one. Talk to your pediatrician regarding the use of this medicine in children. While this medicine may be used in children as young as 1 year for selected conditions, precautions do apply. Overdosage: If you think you have taken too much of this medicine contact a poison control center or emergency room at once. NOTE:  This medicine is only for you. Do not share this medicine with others. What if I miss a dose? If you miss a dose, take it as soon as you can. If it is almost time for your next dose, take only that dose. Do not take double or extra doses. What may interact with this medicine? Do not take this medicine with any of the following medications: -epoetin alfa This list may not describe all possible interactions. Give your health care provider a list of all the medicines, herbs, non-prescription drugs, or dietary supplements you use. Also tell them if you smoke, drink alcohol, or use illegal drugs. Some items may interact with your medicine. What should I watch for while using this medicine? Visit your prescriber or health care professional for regular checks on your progress and for the needed blood tests and blood pressure measurements. It is especially important for the doctor to make sure your hemoglobin level is in the desired range, to limit the risk of potential side effects and to give you the best benefit. Keep all appointments for any recommended tests. Check your blood pressure as directed. Ask your doctor what your blood pressure should be and when you should contact him or her. As your body makes more red blood cells, you may need to take iron, folic acid, or vitamin B supplements. Ask your doctor or health care provider which products are right for you. If you have kidney disease continue dietary restrictions, even though this medication can make you feel better. Talk with your doctor or health care professional about the   foods you eat and the vitamins that you take. What side effects may I notice from receiving this medicine? Side effects that you should report to your doctor or health care professional as soon as possible: -allergic reactions like skin rash, itching or hives, swelling of the face, lips, or tongue -breathing problems -changes in vision -chest pain -confusion, trouble speaking  or understanding -feeling faint or lightheaded, falls -high blood pressure -muscle aches or pains -pain, swelling, warmth in the leg -rapid weight gain -severe headaches -sudden numbness or weakness of the face, arm or leg -trouble walking, dizziness, loss of balance or coordination -seizures (convulsions) -swelling of the ankles, feet, hands -unusually weak or tired Side effects that usually do not require medical attention (report to your doctor or health care professional if they continue or are bothersome): -diarrhea -fever, chills (flu-like symptoms) -headaches -nausea, vomiting -redness, stinging, or swelling at site where injected This list may not describe all possible side effects. Call your doctor for medical advice about side effects. You may report side effects to FDA at 1-800-FDA-1088. Where should I keep my medicine? Keep out of the reach of children. Store in a refrigerator between 2 and 8 degrees C (36 and 46 degrees F). Do not freeze. Do not shake. Throw away any unused portion if using a single-dose vial. Throw away any unused medicine after the expiration date. NOTE: This sheet is a summary. It may not cover all possible information. If you have questions about this medicine, talk to your doctor, pharmacist, or health care provider.    2016, Elsevier/Gold Standard. (2008-06-14 10:23:57)  

## 2016-10-31 NOTE — Progress Notes (Signed)
May give Aranesp 300 mcg with blood pressure of 187/57 per Dr. Alen Blew

## 2016-10-31 NOTE — Progress Notes (Signed)
START ON PATHWAY REGIMEN - MDS     A cycle is every 28 days:     Lenalidomide      Darbepoetin alfa   **Always confirm dose/schedule in your pharmacy ordering system**    Patient Characteristics: Lower-Risk (IPSS-R Score <= 3.5), Second Line, Has Not Yet Had Azacitidine WHO Disease Classification: MDS-SLD Line of therapy: Second Line Risk Category: Lower-Risk  (IPSS-R Score <= 3.5) Prior Azacitidine? Has Not Yet Had Azacitidine  Intent of Therapy: Non-Curative / Palliative Intent, Discussed with Patient

## 2016-10-31 NOTE — Progress Notes (Signed)
Hematology and Oncology Follow Up Visit  Patricia Mata [id number] 11-Nov-1955 61 y.o. 10/31/2016 8:49 AM   Principle Diagnosis: 61 year old with:  1. T3 N1 colon cancer in 10/2006. She had been in remission since the completion of therapy in 2008. 2. 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 Her IPSS-R score was calculated and based on her characteristics she has a score of 2.5.    Prior Therapy: She is S/P hemicolectomy done in 11/2006. 1/13 lymph nodes are positive. (T3N1). She is S/P 12 cycles of FOLFOX completed in 05/2007  Current therapy: Aranesp to 300 g every 2 weeks to keep her hemoglobin above 10.  Interim History:  Patricia Mata return today for a follow up visit. Since the last visit, she reports feeling better overall with improvement in her fatigue and tiredness. She remains active and continues to attend to her activities of daily living. Despite that, she does require packed red cell transfusions periodically.   She denied any complications related to Aranesp at this time. She denied any thrombosis or bleeding complications. She does report her blood pressure is within normal range with ambulatory monitoring. She denied any recent hospitalizations or illnesses.  She is not report any headaches blurry vision, syncope or seizures. She does not report any fevers, chills sweats or weight loss. He does not report any chest pain, palpitation orthopnea or leg edema. She does not report any cough, wheezing or hemoptysis. She does not report any nausea, vomiting or abdominal pain. She does not report any frequency urgency or hesitancy. She does not report any skeletal complaints. Remaining review of systems unremarkable.  Medications: I have reviewed the patient's current medications.   Allergies:  Allergies  Allergen Reactions  . Sulfa Antibiotics Rash and  Other (See Comments)    blisters     Physical Exam: Blood pressure (!) 187/57, pulse 79, temperature 98.2 F (36.8 C), temperature source Oral, resp. rate 18, height _0  (1.6 m), weight 231 lb 14.4 oz (105.2 kg), SpO2 100 %. ECOG: 0 General appearance: Alert, awake: Without distress. Head: Normocephalic, without obvious abnormality no rebound or guarding. Neck: no adenopathy Lymph nodes: Cervical, supraclavicular, and axillary nodes normal. Heart:regular rate and rhythm, S1, S2 normal, no murmur, click, rub or gallop Lung:chest clear, no wheezing, rales, normal symmetric air entry Abdomin: soft, non-tender, without masses or organomegaly no shifting dullness or ascites. EXT:no erythema, induration, or nodules   Lab Results: Lab Results  Component Value Date   WBC 3.1 (L) 10/31/2016   HGB 8.5 (L) 10/31/2016   HCT 24.6 (L) 10/31/2016   MCV 107.1 (H) 10/31/2016   PLT 245 10/31/2016     Chemistry      Component Value Date/Time   NA 137 05/26/2016 1235   NA 139 03/08/2016 0909   K 4.8 05/26/2016 1235   K 4.3 03/08/2016 0909   CL 103 05/26/2016 1235   CL 96 (L) 03/31/2012 1513   CO2 24 05/26/2016 1235   CO2 23 03/08/2016 0909   BUN 27 (H) 05/26/2016 1235   BUN 27.3 (H) 03/08/2016 0909   CREATININE 1.17 (H) 05/26/2016 1235   CREATININE 1.1 03/08/2016 0909      Component Value Date/Time   CALCIUM 10.2 05/26/2016 1235   CALCIUM 9.6 03/08/2016 0909   ALKPHOS 53 05/26/2016 1235   ALKPHOS 65 03/08/2016 0909   AST 24 05/26/2016 1235  AST 21 03/08/2016 0909   ALT 25 05/26/2016 1235   ALT 31 03/08/2016 0909   BILITOT 1.1 05/26/2016 1235   BILITOT 1.16 03/08/2016 0909       Impression and Plan:  61 year old female with the following issues:  1. Stage III (T3N1) colon cancer. She is 9 years out from the completion of therapy without any evidence to suggest recurrent disease.  2. Macrocytic anemia: Her hemoglobin currently is 7.5 with an MCV 116. Her workup did not  reveal any vitamin deficiency. She does have an element of renal insufficiency with a creatinine clearance of 66 mL/m.  Her bone marrow biopsy obtained on 02/01/2016 showed hypercellular marrow with the dyspoietic changes. 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 Her cytogenetics did not reveal any specific abnormalities. Significant blasts were not noted. Her IPSS-R score was calculated and based on her characteristics she has a score of 2.5.    She is currently on Aranesp 300 g every 2 weeks and a periodic red cell transfusion.  The natural course of this disease was reviewed again and different treatment options were discussed. The role of Revlimid at 10 mg daily for 14 days out of a 21 day cycle was discussed today as an alternative to 5 azacitidine which could also be used in this setting. Written information was given to the patient today and she will consider this option in the near future. For the time being, we will continue Aranesp and transfusion as needed.     3. Hypertension: Usually her blood pressure is under reasonable control.   4. Gout: Resolved at this time.  5. Follow-up: Will be in 2 months.    [facility], MD 4/19/20188:49 AM

## 2016-11-14 ENCOUNTER — Other Ambulatory Visit (HOSPITAL_BASED_OUTPATIENT_CLINIC_OR_DEPARTMENT_OTHER): Payer: BC Managed Care – PPO

## 2016-11-14 ENCOUNTER — Ambulatory Visit (HOSPITAL_BASED_OUTPATIENT_CLINIC_OR_DEPARTMENT_OTHER): Payer: BC Managed Care – PPO

## 2016-11-14 VITALS — BP 160/55 | HR 72 | Temp 98.5°F | Resp 20

## 2016-11-14 DIAGNOSIS — D631 Anemia in chronic kidney disease: Secondary | ICD-10-CM

## 2016-11-14 DIAGNOSIS — N181 Chronic kidney disease, stage 1: Secondary | ICD-10-CM | POA: Diagnosis not present

## 2016-11-14 DIAGNOSIS — D500 Iron deficiency anemia secondary to blood loss (chronic): Secondary | ICD-10-CM

## 2016-11-14 LAB — CBC WITH DIFFERENTIAL/PLATELET
BASO%: 0.4 % (ref 0.0–2.0)
Basophils Absolute: 0 10*3/uL (ref 0.0–0.1)
EOS%: 1.6 % (ref 0.0–7.0)
Eosinophils Absolute: 0 10*3/uL (ref 0.0–0.5)
HCT: 22.2 % — ABNORMAL LOW (ref 34.8–46.6)
HGB: 7.5 g/dL — ABNORMAL LOW (ref 11.6–15.9)
LYMPH%: 30.2 % (ref 14.0–49.7)
MCH: 36.2 pg — ABNORMAL HIGH (ref 25.1–34.0)
MCHC: 33.8 g/dL (ref 31.5–36.0)
MCV: 107.2 fL — ABNORMAL HIGH (ref 79.5–101.0)
MONO#: 0.6 10*3/uL (ref 0.1–0.9)
MONO%: 25.4 % — ABNORMAL HIGH (ref 0.0–14.0)
NEUT#: 1.1 10*3/uL — ABNORMAL LOW (ref 1.5–6.5)
NEUT%: 42.4 % (ref 38.4–76.8)
Platelets: 211 10*3/uL (ref 145–400)
RBC: 2.07 10*6/uL — ABNORMAL LOW (ref 3.70–5.45)
WBC: 2.5 10*3/uL — ABNORMAL LOW (ref 3.9–10.3)
lymph#: 0.8 10*3/uL — ABNORMAL LOW (ref 0.9–3.3)

## 2016-11-14 MED ORDER — DARBEPOETIN ALFA 300 MCG/0.6ML IJ SOSY
300.0000 ug | PREFILLED_SYRINGE | Freq: Once | INTRAMUSCULAR | Status: AC
  Administered 2016-11-14: 300 ug via SUBCUTANEOUS
  Filled 2016-11-14: qty 0.6

## 2016-11-14 NOTE — Progress Notes (Signed)
May given Aranesp 300 with BP of 160/55. Pt denies difficulty breathing, chest pain ,  No blood transfusion order per Dr. Alen Blew. Injection only

## 2016-11-14 NOTE — Patient Instructions (Signed)
Darbepoetin Alfa injection What is this medicine? DARBEPOETIN ALFA (dar be POE e tin AL fa) helps your body make more red blood cells. It is used to treat anemia caused by chronic kidney failure and chemotherapy. This medicine may be used for other purposes; ask your health care provider or pharmacist if you have questions. What should I tell my health care provider before I take this medicine? They need to know if you have any of these conditions: -blood clotting disorders or history of blood clots -cancer patient not on chemotherapy -cystic fibrosis -heart disease, such as angina, heart failure, or a history of a heart attack -hemoglobin level of 12 g/dL or greater -high blood pressure -low levels of folate, iron, or vitamin B12 -seizures -an unusual or allergic reaction to darbepoetin, erythropoietin, albumin, hamster proteins, latex, other medicines, foods, dyes, or preservatives -pregnant or trying to get pregnant -breast-feeding How should I use this medicine? This medicine is for injection into a vein or under the skin. It is usually given by a health care professional in a hospital or clinic setting. If you get this medicine at home, you will be taught how to prepare and give this medicine. Do not shake the solution before you withdraw a dose. Use exactly as directed. Take your medicine at regular intervals. Do not take your medicine more often than directed. It is important that you put your used needles and syringes in a special sharps container. Do not put them in a trash can. If you do not have a sharps container, call your pharmacist or healthcare provider to get one. Talk to your pediatrician regarding the use of this medicine in children. While this medicine may be used in children as young as 1 year for selected conditions, precautions do apply. Overdosage: If you think you have taken too much of this medicine contact a poison control center or emergency room at once. NOTE:  This medicine is only for you. Do not share this medicine with others. What if I miss a dose? If you miss a dose, take it as soon as you can. If it is almost time for your next dose, take only that dose. Do not take double or extra doses. What may interact with this medicine? Do not take this medicine with any of the following medications: -epoetin alfa This list may not describe all possible interactions. Give your health care provider a list of all the medicines, herbs, non-prescription drugs, or dietary supplements you use. Also tell them if you smoke, drink alcohol, or use illegal drugs. Some items may interact with your medicine. What should I watch for while using this medicine? Visit your prescriber or health care professional for regular checks on your progress and for the needed blood tests and blood pressure measurements. It is especially important for the doctor to make sure your hemoglobin level is in the desired range, to limit the risk of potential side effects and to give you the best benefit. Keep all appointments for any recommended tests. Check your blood pressure as directed. Ask your doctor what your blood pressure should be and when you should contact him or her. As your body makes more red blood cells, you may need to take iron, folic acid, or vitamin B supplements. Ask your doctor or health care provider which products are right for you. If you have kidney disease continue dietary restrictions, even though this medication can make you feel better. Talk with your doctor or health care professional about the   foods you eat and the vitamins that you take. What side effects may I notice from receiving this medicine? Side effects that you should report to your doctor or health care professional as soon as possible: -allergic reactions like skin rash, itching or hives, swelling of the face, lips, or tongue -breathing problems -changes in vision -chest pain -confusion, trouble speaking  or understanding -feeling faint or lightheaded, falls -high blood pressure -muscle aches or pains -pain, swelling, warmth in the leg -rapid weight gain -severe headaches -sudden numbness or weakness of the face, arm or leg -trouble walking, dizziness, loss of balance or coordination -seizures (convulsions) -swelling of the ankles, feet, hands -unusually weak or tired Side effects that usually do not require medical attention (report to your doctor or health care professional if they continue or are bothersome): -diarrhea -fever, chills (flu-like symptoms) -headaches -nausea, vomiting -redness, stinging, or swelling at site where injected This list may not describe all possible side effects. Call your doctor for medical advice about side effects. You may report side effects to FDA at 1-800-FDA-1088. Where should I keep my medicine? Keep out of the reach of children. Store in a refrigerator between 2 and 8 degrees C (36 and 46 degrees F). Do not freeze. Do not shake. Throw away any unused portion if using a single-dose vial. Throw away any unused medicine after the expiration date. NOTE: This sheet is a summary. It may not cover all possible information. If you have questions about this medicine, talk to your doctor, pharmacist, or health care provider.    2016, Elsevier/Gold Standard. (2008-06-14 10:23:57)  

## 2016-11-28 ENCOUNTER — Ambulatory Visit (HOSPITAL_BASED_OUTPATIENT_CLINIC_OR_DEPARTMENT_OTHER): Payer: BC Managed Care – PPO

## 2016-11-28 ENCOUNTER — Other Ambulatory Visit (HOSPITAL_BASED_OUTPATIENT_CLINIC_OR_DEPARTMENT_OTHER): Payer: BC Managed Care – PPO

## 2016-11-28 VITALS — BP 158/50 | HR 70 | Temp 97.0°F | Resp 18

## 2016-11-28 DIAGNOSIS — D631 Anemia in chronic kidney disease: Secondary | ICD-10-CM

## 2016-11-28 DIAGNOSIS — N181 Chronic kidney disease, stage 1: Secondary | ICD-10-CM

## 2016-11-28 DIAGNOSIS — D500 Iron deficiency anemia secondary to blood loss (chronic): Secondary | ICD-10-CM

## 2016-11-28 LAB — CBC WITH DIFFERENTIAL/PLATELET
BASO%: 0.7 % (ref 0.0–2.0)
Basophils Absolute: 0 10*3/uL (ref 0.0–0.1)
EOS%: 0.8 % (ref 0.0–7.0)
Eosinophils Absolute: 0 10*3/uL (ref 0.0–0.5)
HCT: 21.7 % — ABNORMAL LOW (ref 34.8–46.6)
HGB: 7.5 g/dL — ABNORMAL LOW (ref 11.6–15.9)
LYMPH%: 13.2 % — ABNORMAL LOW (ref 14.0–49.7)
MCH: 39.3 pg — ABNORMAL HIGH (ref 25.1–34.0)
MCHC: 34.5 g/dL (ref 31.5–36.0)
MCV: 113.7 fL — ABNORMAL HIGH (ref 79.5–101.0)
MONO#: 0.8 10*3/uL (ref 0.1–0.9)
MONO%: 19.1 % — ABNORMAL HIGH (ref 0.0–14.0)
NEUT#: 2.6 10*3/uL (ref 1.5–6.5)
NEUT%: 66.2 % (ref 38.4–76.8)
Platelets: 227 10*3/uL (ref 145–400)
RBC: 1.91 10*6/uL — ABNORMAL LOW (ref 3.70–5.45)
RDW: 26.6 % — ABNORMAL HIGH (ref 11.2–14.5)
WBC: 4 10*3/uL (ref 3.9–10.3)
lymph#: 0.5 10*3/uL — ABNORMAL LOW (ref 0.9–3.3)

## 2016-11-28 MED ORDER — DARBEPOETIN ALFA 300 MCG/0.6ML IJ SOSY
300.0000 ug | PREFILLED_SYRINGE | Freq: Once | INTRAMUSCULAR | Status: AC
  Administered 2016-11-28: 300 ug via SUBCUTANEOUS
  Filled 2016-11-28: qty 0.6

## 2016-11-28 NOTE — Patient Instructions (Signed)
Darbepoetin Alfa injection What is this medicine? DARBEPOETIN ALFA (dar be POE e tin AL fa) helps your body make more red blood cells. It is used to treat anemia caused by chronic kidney failure and chemotherapy. This medicine may be used for other purposes; ask your health care provider or pharmacist if you have questions. What should I tell my health care provider before I take this medicine? They need to know if you have any of these conditions: -blood clotting disorders or history of blood clots -cancer patient not on chemotherapy -cystic fibrosis -heart disease, such as angina, heart failure, or a history of a heart attack -hemoglobin level of 12 g/dL or greater -high blood pressure -low levels of folate, iron, or vitamin B12 -seizures -an unusual or allergic reaction to darbepoetin, erythropoietin, albumin, hamster proteins, latex, other medicines, foods, dyes, or preservatives -pregnant or trying to get pregnant -breast-feeding How should I use this medicine? This medicine is for injection into a vein or under the skin. It is usually given by a health care professional in a hospital or clinic setting. If you get this medicine at home, you will be taught how to prepare and give this medicine. Do not shake the solution before you withdraw a dose. Use exactly as directed. Take your medicine at regular intervals. Do not take your medicine more often than directed. It is important that you put your used needles and syringes in a special sharps container. Do not put them in a trash can. If you do not have a sharps container, call your pharmacist or healthcare provider to get one. Talk to your pediatrician regarding the use of this medicine in children. While this medicine may be used in children as young as 1 year for selected conditions, precautions do apply. Overdosage: If you think you have taken too much of this medicine contact a poison control center or emergency room at once. NOTE:  This medicine is only for you. Do not share this medicine with others. What if I miss a dose? If you miss a dose, take it as soon as you can. If it is almost time for your next dose, take only that dose. Do not take double or extra doses. What may interact with this medicine? Do not take this medicine with any of the following medications: -epoetin alfa This list may not describe all possible interactions. Give your health care provider a list of all the medicines, herbs, non-prescription drugs, or dietary supplements you use. Also tell them if you smoke, drink alcohol, or use illegal drugs. Some items may interact with your medicine. What should I watch for while using this medicine? Visit your prescriber or health care professional for regular checks on your progress and for the needed blood tests and blood pressure measurements. It is especially important for the doctor to make sure your hemoglobin level is in the desired range, to limit the risk of potential side effects and to give you the best benefit. Keep all appointments for any recommended tests. Check your blood pressure as directed. Ask your doctor what your blood pressure should be and when you should contact him or her. As your body makes more red blood cells, you may need to take iron, folic acid, or vitamin B supplements. Ask your doctor or health care provider which products are right for you. If you have kidney disease continue dietary restrictions, even though this medication can make you feel better. Talk with your doctor or health care professional about the   foods you eat and the vitamins that you take. What side effects may I notice from receiving this medicine? Side effects that you should report to your doctor or health care professional as soon as possible: -allergic reactions like skin rash, itching or hives, swelling of the face, lips, or tongue -breathing problems -changes in vision -chest pain -confusion, trouble speaking  or understanding -feeling faint or lightheaded, falls -high blood pressure -muscle aches or pains -pain, swelling, warmth in the leg -rapid weight gain -severe headaches -sudden numbness or weakness of the face, arm or leg -trouble walking, dizziness, loss of balance or coordination -seizures (convulsions) -swelling of the ankles, feet, hands -unusually weak or tired Side effects that usually do not require medical attention (report to your doctor or health care professional if they continue or are bothersome): -diarrhea -fever, chills (flu-like symptoms) -headaches -nausea, vomiting -redness, stinging, or swelling at site where injected This list may not describe all possible side effects. Call your doctor for medical advice about side effects. You may report side effects to FDA at 1-800-FDA-1088. Where should I keep my medicine? Keep out of the reach of children. Store in a refrigerator between 2 and 8 degrees C (36 and 46 degrees F). Do not freeze. Do not shake. Throw away any unused portion if using a single-dose vial. Throw away any unused medicine after the expiration date. NOTE: This sheet is a summary. It may not cover all possible information. If you have questions about this medicine, talk to your doctor, pharmacist, or health care provider.    2016, Elsevier/Gold Standard. (2008-06-14 10:23:57)  

## 2016-11-28 NOTE — Progress Notes (Signed)
Pt denies difficulty breathing, no chest pain, no active bleeding. Pt instructed to call office at once if having difficulty breathing, chest pain or increased fatuige. Pt verbalized understanding of instuctions. Dr, Alen Blew informed of findings, advises shot only at this time.

## 2016-12-12 ENCOUNTER — Other Ambulatory Visit (HOSPITAL_BASED_OUTPATIENT_CLINIC_OR_DEPARTMENT_OTHER): Payer: BC Managed Care – PPO

## 2016-12-12 ENCOUNTER — Other Ambulatory Visit: Payer: Self-pay | Admitting: *Deleted

## 2016-12-12 ENCOUNTER — Ambulatory Visit (HOSPITAL_BASED_OUTPATIENT_CLINIC_OR_DEPARTMENT_OTHER): Payer: BC Managed Care – PPO

## 2016-12-12 ENCOUNTER — Ambulatory Visit (HOSPITAL_COMMUNITY)
Admission: RE | Admit: 2016-12-12 | Discharge: 2016-12-12 | Disposition: A | Discharge status: Home / Self Care | Payer: BC Managed Care – PPO | Source: Ambulatory Visit | Attending: Oncology | Admitting: Oncology

## 2016-12-12 VITALS — BP 159/68 | HR 65 | Temp 97.0°F | Resp 18

## 2016-12-12 DIAGNOSIS — D500 Iron deficiency anemia secondary to blood loss (chronic): Secondary | ICD-10-CM

## 2016-12-12 DIAGNOSIS — N181 Chronic kidney disease, stage 1: Secondary | ICD-10-CM

## 2016-12-12 DIAGNOSIS — N189 Chronic kidney disease, unspecified: Secondary | ICD-10-CM | POA: Diagnosis not present

## 2016-12-12 DIAGNOSIS — D631 Anemia in chronic kidney disease: Secondary | ICD-10-CM

## 2016-12-12 LAB — CBC WITH DIFFERENTIAL/PLATELET
BASO%: 0.4 % (ref 0.0–2.0)
Basophils Absolute: 0 10*3/uL (ref 0.0–0.1)
EOS%: 1.1 % (ref 0.0–7.0)
Eosinophils Absolute: 0 10*3/uL (ref 0.0–0.5)
HCT: 21.1 % — ABNORMAL LOW (ref 34.8–46.6)
HGB: 7.2 g/dL — ABNORMAL LOW (ref 11.6–15.9)
LYMPH%: 22.2 % (ref 14.0–49.7)
MCH: 38.9 pg — ABNORMAL HIGH (ref 25.1–34.0)
MCHC: 34.1 g/dL (ref 31.5–36.0)
MCV: 114.1 fL — ABNORMAL HIGH (ref 79.5–101.0)
MONO#: 0.6 10*3/uL (ref 0.1–0.9)
MONO%: 23.4 % — ABNORMAL HIGH (ref 0.0–14.0)
NEUT#: 1.4 10*3/uL — ABNORMAL LOW (ref 1.5–6.5)
NEUT%: 52.9 % (ref 38.4–76.8)
Platelets: 207 10*3/uL (ref 145–400)
RBC: 1.85 10*6/uL — ABNORMAL LOW (ref 3.70–5.45)
RDW: 21.3 % — ABNORMAL HIGH (ref 11.2–14.5)
WBC: 2.6 10*3/uL — ABNORMAL LOW (ref 3.9–10.3)
lymph#: 0.6 10*3/uL — ABNORMAL LOW (ref 0.9–3.3)

## 2016-12-12 LAB — PREPARE RBC (CROSSMATCH)

## 2016-12-12 MED ORDER — DARBEPOETIN ALFA 300 MCG/0.6ML IJ SOSY
300.0000 ug | PREFILLED_SYRINGE | Freq: Once | INTRAMUSCULAR | Status: AC
  Administered 2016-12-12: 300 ug via SUBCUTANEOUS
  Filled 2016-12-12: qty 0.6

## 2016-12-12 NOTE — Patient Instructions (Signed)
Darbepoetin Alfa injection What is this medicine? DARBEPOETIN ALFA (dar be POE e tin AL fa) helps your body make more red blood cells. It is used to treat anemia caused by chronic kidney failure and chemotherapy. This medicine may be used for other purposes; ask your health care provider or pharmacist if you have questions. What should I tell my health care provider before I take this medicine? They need to know if you have any of these conditions: -blood clotting disorders or history of blood clots -cancer patient not on chemotherapy -cystic fibrosis -heart disease, such as angina, heart failure, or a history of a heart attack -hemoglobin level of 12 g/dL or greater -high blood pressure -low levels of folate, iron, or vitamin B12 -seizures -an unusual or allergic reaction to darbepoetin, erythropoietin, albumin, hamster proteins, latex, other medicines, foods, dyes, or preservatives -pregnant or trying to get pregnant -breast-feeding How should I use this medicine? This medicine is for injection into a vein or under the skin. It is usually given by a health care professional in a hospital or clinic setting. If you get this medicine at home, you will be taught how to prepare and give this medicine. Do not shake the solution before you withdraw a dose. Use exactly as directed. Take your medicine at regular intervals. Do not take your medicine more often than directed. It is important that you put your used needles and syringes in a special sharps container. Do not put them in a trash can. If you do not have a sharps container, call your pharmacist or healthcare provider to get one. Talk to your pediatrician regarding the use of this medicine in children. While this medicine may be used in children as young as 1 year for selected conditions, precautions do apply. Overdosage: If you think you have taken too much of this medicine contact a poison control center or emergency room at once. NOTE:  This medicine is only for you. Do not share this medicine with others. What if I miss a dose? If you miss a dose, take it as soon as you can. If it is almost time for your next dose, take only that dose. Do not take double or extra doses. What may interact with this medicine? Do not take this medicine with any of the following medications: -epoetin alfa This list may not describe all possible interactions. Give your health care provider a list of all the medicines, herbs, non-prescription drugs, or dietary supplements you use. Also tell them if you smoke, drink alcohol, or use illegal drugs. Some items may interact with your medicine. What should I watch for while using this medicine? Visit your prescriber or health care professional for regular checks on your progress and for the needed blood tests and blood pressure measurements. It is especially important for the doctor to make sure your hemoglobin level is in the desired range, to limit the risk of potential side effects and to give you the best benefit. Keep all appointments for any recommended tests. Check your blood pressure as directed. Ask your doctor what your blood pressure should be and when you should contact him or her. As your body makes more red blood cells, you may need to take iron, folic acid, or vitamin B supplements. Ask your doctor or health care provider which products are right for you. If you have kidney disease continue dietary restrictions, even though this medication can make you feel better. Talk with your doctor or health care professional about the   foods you eat and the vitamins that you take. What side effects may I notice from receiving this medicine? Side effects that you should report to your doctor or health care professional as soon as possible: -allergic reactions like skin rash, itching or hives, swelling of the face, lips, or tongue -breathing problems -changes in vision -chest pain -confusion, trouble speaking  or understanding -feeling faint or lightheaded, falls -high blood pressure -muscle aches or pains -pain, swelling, warmth in the leg -rapid weight gain -severe headaches -sudden numbness or weakness of the face, arm or leg -trouble walking, dizziness, loss of balance or coordination -seizures (convulsions) -swelling of the ankles, feet, hands -unusually weak or tired Side effects that usually do not require medical attention (report to your doctor or health care professional if they continue or are bothersome): -diarrhea -fever, chills (flu-like symptoms) -headaches -nausea, vomiting -redness, stinging, or swelling at site where injected This list may not describe all possible side effects. Call your doctor for medical advice about side effects. You may report side effects to FDA at 1-800-FDA-1088. Where should I keep my medicine? Keep out of the reach of children. Store in a refrigerator between 2 and 8 degrees C (36 and 46 degrees F). Do not freeze. Do not shake. Throw away any unused portion if using a single-dose vial. Throw away any unused medicine after the expiration date. NOTE: This sheet is a summary. It may not cover all possible information. If you have questions about this medicine, talk to your doctor, pharmacist, or health care provider.    2016, Elsevier/Gold Standard. (2008-06-14 10:23:57)  

## 2016-12-13 ENCOUNTER — Ambulatory Visit (HOSPITAL_COMMUNITY)
Admission: RE | Admit: 2016-12-13 | Discharge: 2016-12-13 | Disposition: A | Discharge status: Home / Self Care | Payer: BC Managed Care – PPO | Source: Ambulatory Visit | Attending: Oncology | Admitting: Oncology

## 2016-12-13 DIAGNOSIS — D631 Anemia in chronic kidney disease: Secondary | ICD-10-CM | POA: Diagnosis present

## 2016-12-13 DIAGNOSIS — N189 Chronic kidney disease, unspecified: Secondary | ICD-10-CM | POA: Diagnosis present

## 2016-12-13 MED ORDER — ACETAMINOPHEN 325 MG PO TABS
650.0000 mg | ORAL_TABLET | Freq: Once | ORAL | Status: AC
  Administered 2016-12-13: 650 mg via ORAL
  Filled 2016-12-13: qty 2

## 2016-12-13 MED ORDER — DIPHENHYDRAMINE HCL 25 MG PO CAPS
25.0000 mg | ORAL_CAPSULE | Freq: Once | ORAL | Status: AC
  Administered 2016-12-13: 25 mg via ORAL
  Filled 2016-12-13: qty 1

## 2016-12-13 MED ORDER — SODIUM CHLORIDE 0.9 % IV SOLN
250.0000 mL | Freq: Once | INTRAVENOUS | Status: AC
  Administered 2016-12-13: 250 mL via INTRAVENOUS

## 2016-12-13 NOTE — Discharge Instructions (Signed)

## 2016-12-13 NOTE — Progress Notes (Signed)
  Diagnosis Association: Anemia in chronic kidney disease, unspecified CKD stage (N18.9 , D63.1)  Provider: Dr. Alen Blew  Procedure: Pt received 2 units of PRBCs  Pt tolerated procedure well.  Post procedure: Pt alert, oriented and ambulatory. Discharge instructions given with verbal understanding.

## 2016-12-16 LAB — TYPE AND SCREEN
ABO/RH(D): A NEG
Antibody Screen: NEGATIVE
Unit division: 0
Unit division: 0

## 2016-12-16 LAB — BPAM RBC
Blood Product Expiration Date: 201806192359
Blood Product Expiration Date: 201806192359
ISSUE DATE / TIME: 201806010854
ISSUE DATE / TIME: 201806010854
Unit Type and Rh: 600
Unit Type and Rh: 600

## 2016-12-24 ENCOUNTER — Telehealth: Payer: Self-pay | Admitting: Oncology

## 2016-12-24 ENCOUNTER — Telehealth: Payer: Self-pay | Admitting: *Deleted

## 2016-12-24 NOTE — Telephone Encounter (Signed)
"  Calling to reschedule this week's injection.  Could it be moved to Friday, December 27, 2016.  I've never had to reschedule but Thursday I have to work all day for the kids graduations.  I can be reached at [phone]."    Scheduling message sent.

## 2016-12-24 NOTE — Telephone Encounter (Signed)
sw pt husband to confirm r/s lab/inj appt to 6/15 at 11 am per sch msg

## 2016-12-26 ENCOUNTER — Other Ambulatory Visit: Payer: BC Managed Care – PPO

## 2016-12-26 ENCOUNTER — Ambulatory Visit: Payer: BC Managed Care – PPO

## 2016-12-27 ENCOUNTER — Ambulatory Visit (HOSPITAL_BASED_OUTPATIENT_CLINIC_OR_DEPARTMENT_OTHER): Payer: BC Managed Care – PPO

## 2016-12-27 ENCOUNTER — Other Ambulatory Visit (HOSPITAL_BASED_OUTPATIENT_CLINIC_OR_DEPARTMENT_OTHER): Payer: BC Managed Care – PPO

## 2016-12-27 VITALS — BP 158/62 | HR 70 | Temp 97.9°F | Resp 20

## 2016-12-27 DIAGNOSIS — N181 Chronic kidney disease, stage 1: Secondary | ICD-10-CM | POA: Diagnosis not present

## 2016-12-27 DIAGNOSIS — D631 Anemia in chronic kidney disease: Secondary | ICD-10-CM

## 2016-12-27 DIAGNOSIS — D500 Iron deficiency anemia secondary to blood loss (chronic): Secondary | ICD-10-CM

## 2016-12-27 LAB — CBC WITH DIFFERENTIAL/PLATELET
BASO%: 0.8 % (ref 0.0–2.0)
Basophils Absolute: 0 10*3/uL (ref 0.0–0.1)
EOS%: 1.7 % (ref 0.0–7.0)
Eosinophils Absolute: 0.1 10*3/uL (ref 0.0–0.5)
HCT: 22.6 % — ABNORMAL LOW (ref 34.8–46.6)
HGB: 7.7 g/dL — ABNORMAL LOW (ref 11.6–15.9)
LYMPH%: 17.8 % (ref 14.0–49.7)
MCH: 37.5 pg — ABNORMAL HIGH (ref 25.1–34.0)
MCHC: 34.3 g/dL (ref 31.5–36.0)
MCV: 109.5 fL — ABNORMAL HIGH (ref 79.5–101.0)
MONO#: 0.6 10*3/uL (ref 0.1–0.9)
MONO%: 20.1 % — ABNORMAL HIGH (ref 0.0–14.0)
NEUT#: 1.8 10*3/uL (ref 1.5–6.5)
NEUT%: 59.6 % (ref 38.4–76.8)
Platelets: 238 10*3/uL (ref 145–400)
RBC: 2.06 10*6/uL — ABNORMAL LOW (ref 3.70–5.45)
RDW: 27.4 % — ABNORMAL HIGH (ref 11.2–14.5)
WBC: 3 10*3/uL — ABNORMAL LOW (ref 3.9–10.3)
lymph#: 0.5 10*3/uL — ABNORMAL LOW (ref 0.9–3.3)

## 2016-12-27 MED ORDER — DARBEPOETIN ALFA 300 MCG/0.6ML IJ SOSY
300.0000 ug | PREFILLED_SYRINGE | Freq: Once | INTRAMUSCULAR | Status: AC
  Administered 2016-12-27: 300 ug via SUBCUTANEOUS
  Filled 2016-12-27: qty 0.6

## 2016-12-27 NOTE — Patient Instructions (Signed)
Darbepoetin Alfa injection What is this medicine? DARBEPOETIN ALFA (dar be POE e tin AL fa) helps your body make more red blood cells. It is used to treat anemia caused by chronic kidney failure and chemotherapy. This medicine may be used for other purposes; ask your health care provider or pharmacist if you have questions. What should I tell my health care provider before I take this medicine? They need to know if you have any of these conditions: -blood clotting disorders or history of blood clots -cancer patient not on chemotherapy -cystic fibrosis -heart disease, such as angina, heart failure, or a history of a heart attack -hemoglobin level of 12 g/dL or greater -high blood pressure -low levels of folate, iron, or vitamin B12 -seizures -an unusual or allergic reaction to darbepoetin, erythropoietin, albumin, hamster proteins, latex, other medicines, foods, dyes, or preservatives -pregnant or trying to get pregnant -breast-feeding How should I use this medicine? This medicine is for injection into a vein or under the skin. It is usually given by a health care professional in a hospital or clinic setting. If you get this medicine at home, you will be taught how to prepare and give this medicine. Do not shake the solution before you withdraw a dose. Use exactly as directed. Take your medicine at regular intervals. Do not take your medicine more often than directed. It is important that you put your used needles and syringes in a special sharps container. Do not put them in a trash can. If you do not have a sharps container, call your pharmacist or healthcare provider to get one. Talk to your pediatrician regarding the use of this medicine in children. While this medicine may be used in children as young as 1 year for selected conditions, precautions do apply. Overdosage: If you think you have taken too much of this medicine contact a poison control center or emergency room at once. NOTE:  This medicine is only for you. Do not share this medicine with others. What if I miss a dose? If you miss a dose, take it as soon as you can. If it is almost time for your next dose, take only that dose. Do not take double or extra doses. What may interact with this medicine? Do not take this medicine with any of the following medications: -epoetin alfa This list may not describe all possible interactions. Give your health care provider a list of all the medicines, herbs, non-prescription drugs, or dietary supplements you use. Also tell them if you smoke, drink alcohol, or use illegal drugs. Some items may interact with your medicine. What should I watch for while using this medicine? Visit your prescriber or health care professional for regular checks on your progress and for the needed blood tests and blood pressure measurements. It is especially important for the doctor to make sure your hemoglobin level is in the desired range, to limit the risk of potential side effects and to give you the best benefit. Keep all appointments for any recommended tests. Check your blood pressure as directed. Ask your doctor what your blood pressure should be and when you should contact him or her. As your body makes more red blood cells, you may need to take iron, folic acid, or vitamin B supplements. Ask your doctor or health care provider which products are right for you. If you have kidney disease continue dietary restrictions, even though this medication can make you feel better. Talk with your doctor or health care professional about the   foods you eat and the vitamins that you take. What side effects may I notice from receiving this medicine? Side effects that you should report to your doctor or health care professional as soon as possible: -allergic reactions like skin rash, itching or hives, swelling of the face, lips, or tongue -breathing problems -changes in vision -chest pain -confusion, trouble speaking  or understanding -feeling faint or lightheaded, falls -high blood pressure -muscle aches or pains -pain, swelling, warmth in the leg -rapid weight gain -severe headaches -sudden numbness or weakness of the face, arm or leg -trouble walking, dizziness, loss of balance or coordination -seizures (convulsions) -swelling of the ankles, feet, hands -unusually weak or tired Side effects that usually do not require medical attention (report to your doctor or health care professional if they continue or are bothersome): -diarrhea -fever, chills (flu-like symptoms) -headaches -nausea, vomiting -redness, stinging, or swelling at site where injected This list may not describe all possible side effects. Call your doctor for medical advice about side effects. You may report side effects to FDA at 1-800-FDA-1088. Where should I keep my medicine? Keep out of the reach of children. Store in a refrigerator between 2 and 8 degrees C (36 and 46 degrees F). Do not freeze. Do not shake. Throw away any unused portion if using a single-dose vial. Throw away any unused medicine after the expiration date. NOTE: This sheet is a summary. It may not cover all possible information. If you have questions about this medicine, talk to your doctor, pharmacist, or health care provider.    2016, Elsevier/Gold Standard. (2008-06-14 10:23:57)  

## 2017-01-09 ENCOUNTER — Telehealth: Payer: Self-pay | Admitting: *Deleted

## 2017-01-09 ENCOUNTER — Ambulatory Visit (HOSPITAL_BASED_OUTPATIENT_CLINIC_OR_DEPARTMENT_OTHER): Payer: BC Managed Care – PPO

## 2017-01-09 ENCOUNTER — Other Ambulatory Visit (HOSPITAL_BASED_OUTPATIENT_CLINIC_OR_DEPARTMENT_OTHER): Payer: BC Managed Care – PPO

## 2017-01-09 ENCOUNTER — Telehealth: Payer: Self-pay | Admitting: Oncology

## 2017-01-09 ENCOUNTER — Ambulatory Visit: Payer: BC Managed Care – PPO

## 2017-01-09 ENCOUNTER — Ambulatory Visit (HOSPITAL_COMMUNITY)
Admission: RE | Admit: 2017-01-09 | Discharge: 2017-01-09 | Disposition: A | Discharge status: Home / Self Care | Payer: BC Managed Care – PPO | Source: Ambulatory Visit | Attending: Oncology | Admitting: Oncology

## 2017-01-09 ENCOUNTER — Ambulatory Visit (HOSPITAL_BASED_OUTPATIENT_CLINIC_OR_DEPARTMENT_OTHER): Payer: BC Managed Care – PPO | Admitting: Oncology

## 2017-01-09 ENCOUNTER — Encounter: Payer: Self-pay | Admitting: *Deleted

## 2017-01-09 VITALS — BP 170/54 | HR 75 | Temp 98.0°F | Resp 18 | Ht 63.0 in | Wt 239.4 lb

## 2017-01-09 DIAGNOSIS — Z85038 Personal history of other malignant neoplasm of large intestine: Secondary | ICD-10-CM | POA: Diagnosis not present

## 2017-01-09 DIAGNOSIS — I10 Essential (primary) hypertension: Secondary | ICD-10-CM

## 2017-01-09 DIAGNOSIS — D539 Nutritional anemia, unspecified: Secondary | ICD-10-CM | POA: Diagnosis not present

## 2017-01-09 DIAGNOSIS — D500 Iron deficiency anemia secondary to blood loss (chronic): Secondary | ICD-10-CM

## 2017-01-09 DIAGNOSIS — D631 Anemia in chronic kidney disease: Secondary | ICD-10-CM

## 2017-01-09 DIAGNOSIS — N181 Chronic kidney disease, stage 1: Secondary | ICD-10-CM

## 2017-01-09 DIAGNOSIS — D649 Anemia, unspecified: Secondary | ICD-10-CM

## 2017-01-09 DIAGNOSIS — D469 Myelodysplastic syndrome, unspecified: Secondary | ICD-10-CM

## 2017-01-09 DIAGNOSIS — N189 Chronic kidney disease, unspecified: Secondary | ICD-10-CM | POA: Diagnosis not present

## 2017-01-09 LAB — PREPARE RBC (CROSSMATCH)

## 2017-01-09 LAB — CBC WITH DIFFERENTIAL/PLATELET
BASO%: 0.5 % (ref 0.0–2.0)
Basophils Absolute: 0 10*3/uL (ref 0.0–0.1)
EOS%: 1.3 % (ref 0.0–7.0)
Eosinophils Absolute: 0.1 10*3/uL (ref 0.0–0.5)
HCT: 20.9 % — ABNORMAL LOW (ref 34.8–46.6)
HGB: 7 g/dL — ABNORMAL LOW (ref 11.6–15.9)
LYMPH%: 18 % (ref 14.0–49.7)
MCH: 36.8 pg — ABNORMAL HIGH (ref 25.1–34.0)
MCHC: 33.5 g/dL (ref 31.5–36.0)
MCV: 110 fL — ABNORMAL HIGH (ref 79.5–101.0)
MONO#: 0.8 10*3/uL (ref 0.1–0.9)
MONO%: 20.3 % — ABNORMAL HIGH (ref 0.0–14.0)
NEUT#: 2.3 10*3/uL (ref 1.5–6.5)
NEUT%: 59.9 % (ref 38.4–76.8)
Platelets: 208 10*3/uL (ref 145–400)
RBC: 1.9 10*6/uL — ABNORMAL LOW (ref 3.70–5.45)
RDW: 23.8 % — ABNORMAL HIGH (ref 11.2–14.5)
WBC: 3.9 10*3/uL (ref 3.9–10.3)
lymph#: 0.7 10*3/uL — ABNORMAL LOW (ref 0.9–3.3)
nRBC: 1 % — ABNORMAL HIGH (ref 0–0)

## 2017-01-09 MED ORDER — DIPHENHYDRAMINE HCL 25 MG PO CAPS
ORAL_CAPSULE | ORAL | Status: AC
  Filled 2017-01-09: qty 1

## 2017-01-09 MED ORDER — ACETAMINOPHEN 325 MG PO TABS
650.0000 mg | ORAL_TABLET | Freq: Once | ORAL | Status: AC
  Administered 2017-01-09: 650 mg via ORAL

## 2017-01-09 MED ORDER — DIPHENHYDRAMINE HCL 25 MG PO CAPS
25.0000 mg | ORAL_CAPSULE | Freq: Once | ORAL | Status: AC
  Administered 2017-01-09: 25 mg via ORAL

## 2017-01-09 MED ORDER — ACETAMINOPHEN 325 MG PO TABS
ORAL_TABLET | ORAL | Status: AC
  Filled 2017-01-09: qty 2

## 2017-01-09 MED ORDER — SODIUM CHLORIDE 0.9 % IV SOLN
250.0000 mL | Freq: Once | INTRAVENOUS | Status: AC
  Administered 2017-01-09: 250 mL via INTRAVENOUS

## 2017-01-09 NOTE — Telephone Encounter (Signed)
Appointment date and time scheduled per Dr Alen Blew. Patient was given a copy of the AVS report and appointment schedule, per 01/09/17 los.

## 2017-01-09 NOTE — Progress Notes (Signed)
Injection held today per Dr. Danise Mina verbal order. Pt is to get blood transfusion

## 2017-01-09 NOTE — Telephone Encounter (Signed)
Faxed request to pathology at [facility], for slides, AND CONTACTED RADIOLOGY , SPOKE TO [place] TO A CD,  to be mailed to [place], FOR APPT WITH DR Florene Glen July 6TH @ 1:00

## 2017-01-09 NOTE — Telephone Encounter (Signed)
Referral appt made for 01/17/17 @ 1:00 pm.  for patient to see dr Jerrye Noble at [facility] for MDS. They will mail packet to patient's home. Patient given written information on day and time.

## 2017-01-09 NOTE — Progress Notes (Signed)
Hematology and Oncology Follow Up Visit  Patricia Mata [id number] 17-Nov-1955 61 y.o. 01/09/2017 9:40 AM   Principle Diagnosis: 61 year old with:  1. T3 N1 colon cancer in 10/2006. She had been in remission since the completion of therapy in 2008. 2. 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 Her IPSS-R score was calculated and based on her characteristics she has a score of 2.5.    Prior Therapy: She is S/P hemicolectomy done in 11/2006. 1/13 lymph nodes are positive. (T3N1). She is S/P 12 cycles of FOLFOX completed in 05/2007  Current therapy: Aranesp to 300 g every 2 weeks to keep her hemoglobin above 10.  Interim History:  Patricia Mata return today for a follow up visit. Since the last visit, she reports no major changes in her health. She does report some mild fatigue but still active and continues to work full time. She denied any hematochezia or melena. She denied any easy bruisability. She denied any constitutional symptoms.  She denied any complications related to Aranesp at this time. She denied any thrombosis or bleeding complications. She continues to require packed red cell transfusion about once a month.  She is not report any headaches blurry vision, syncope or seizures. She does not report any fevers, chills sweats or weight loss. He does not report any chest pain, palpitation orthopnea or leg edema. She does not report any cough, wheezing or hemoptysis. She does not report any nausea, vomiting or abdominal pain. She does not report any frequency urgency or hesitancy. She does not report any skeletal complaints. Remaining review of systems unremarkable.  Medications: I have reviewed the patient's current medications.   Allergies:  Allergies  Allergen Reactions  . Sulfa Antibiotics Rash and Other (See Comments)    blisters     Physical  Exam: Blood pressure (!) 170/54, pulse 75, temperature 98 F (36.7 C), temperature source Oral, resp. rate 18, height '5\' 3"'  (1.6 m), weight 239 lb 6.4 oz (108.6 kg), SpO2 100 %. ECOG: 0 General appearance: Well-appearing woman appeared without distress today. Head: Normocephalic, without obvious abnormality no oral ulcers or lesions. Neck: no adenopathy Lymph nodes: Cervical, supraclavicular, and axillary nodes normal. Heart:regular rate and rhythm, S1, S2 normal, no murmur, click, rub or gallop Lung:chest clear, no wheezing, rales, normal symmetric air entry Abdomin: soft, non-tender, without masses or organomegaly no rebound or guarding. EXT:no erythema, induration, or nodules   Lab Results: Lab Results  Component Value Date   WBC 3.9 01/09/2017   HGB 7.0 (L) 01/09/2017   HCT 20.9 (L) 01/09/2017   MCV 110.0 (H) 01/09/2017   PLT 208 01/09/2017     Chemistry      Component Value Date/Time   NA 137 05/26/2016 1235   NA 139 03/08/2016 0909   K 4.8 05/26/2016 1235   K 4.3 03/08/2016 0909   CL 103 05/26/2016 1235   CL 96 (L) 03/31/2012 1513   CO2 24 05/26/2016 1235   CO2 23 03/08/2016 0909   BUN 27 (H) 05/26/2016 1235   BUN 27.3 (H) 03/08/2016 0909   CREATININE 1.17 (H) 05/26/2016 1235   CREATININE 1.1 03/08/2016 0909      Component Value Date/Time   CALCIUM 10.2 05/26/2016 1235   CALCIUM 9.6 03/08/2016 0909   ALKPHOS 53 05/26/2016 1235   ALKPHOS 65 03/08/2016 0909   AST 24 05/26/2016 1235   AST 21 03/08/2016  0909   ALT 25 05/26/2016 1235   ALT 31 03/08/2016 0909   BILITOT 1.1 05/26/2016 1235   BILITOT 1.16 03/08/2016 0909       Impression and Plan:  61 year old female with the following issues:  1. Stage III (T3N1) colon cancer. She is 9 years out from the completion of therapy without any evidence to suggest recurrent disease.  2. Macrocytic anemia: Her hemoglobin currently is 7.5 with an MCV 116. Her workup did not reveal any vitamin deficiency. She does  have an element of renal insufficiency with a creatinine clearance of 66 mL/m.  Her bone marrow biopsy obtained on 02/01/2016 showed hypercellular marrow with the dyspoietic changes. 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 Her cytogenetics did not reveal any specific abnormalities. Significant blasts were not noted. Her IPSS-R score was calculated and based on her characteristics she has a score of 2.5.    She is currently on Aranesp 300 g every 2 weeks and a periodic red cell transfusion.  Options of therapy were reviewed today as she is still transfusion dependent. These options would include low-dose Revlimid versus Vidaza. Risks and benefits of both approaches were reviewed again today as well as success rate. I am in favor of using Vidaza at this time. I'm not sure if she is a transplant candidate. I would like for her to get an opinion at [facility] for her transplant candidacy as well as treatment option for her MDS. I discussed that with her today and she is agreeable to obtain an opinion regarding this issue.  In the meantime, we'll continue supportive transfusion and Aranesp.   3. Hypertension: Usually her blood pressure is under reasonable control.   4. Follow-up: Will be in 2 months.    [facility], MD 6/28/20189:40 AM

## 2017-01-10 LAB — TYPE AND SCREEN
ABO/RH(D): A NEG
Antibody Screen: NEGATIVE
Unit division: 0
Unit division: 0

## 2017-01-10 LAB — BPAM RBC
Blood Product Expiration Date: 201807162359
Blood Product Expiration Date: 201807162359
ISSUE DATE / TIME: 201806281200
ISSUE DATE / TIME: 201806281200
Unit Type and Rh: 600
Unit Type and Rh: 600

## 2017-01-13 ENCOUNTER — Ambulatory Visit (HOSPITAL_COMMUNITY)
Admission: RE | Admit: 2017-01-13 | Discharge: 2017-01-13 | Disposition: A | Discharge status: Home / Self Care | Payer: BC Managed Care – PPO | Source: Ambulatory Visit | Attending: Oncology | Admitting: Oncology

## 2017-01-13 DIAGNOSIS — N189 Chronic kidney disease, unspecified: Secondary | ICD-10-CM | POA: Insufficient documentation

## 2017-01-13 DIAGNOSIS — D631 Anemia in chronic kidney disease: Secondary | ICD-10-CM | POA: Insufficient documentation

## 2017-01-23 ENCOUNTER — Ambulatory Visit (HOSPITAL_BASED_OUTPATIENT_CLINIC_OR_DEPARTMENT_OTHER): Payer: BC Managed Care – PPO

## 2017-01-23 ENCOUNTER — Other Ambulatory Visit (HOSPITAL_BASED_OUTPATIENT_CLINIC_OR_DEPARTMENT_OTHER): Payer: BC Managed Care – PPO

## 2017-01-23 VITALS — BP 158/57 | HR 70 | Temp 98.0°F | Resp 20

## 2017-01-23 DIAGNOSIS — N181 Chronic kidney disease, stage 1: Secondary | ICD-10-CM | POA: Diagnosis not present

## 2017-01-23 DIAGNOSIS — D631 Anemia in chronic kidney disease: Secondary | ICD-10-CM

## 2017-01-23 DIAGNOSIS — D500 Iron deficiency anemia secondary to blood loss (chronic): Secondary | ICD-10-CM

## 2017-01-23 LAB — CBC WITH DIFFERENTIAL/PLATELET
BASO%: 0.5 % (ref 0.0–2.0)
Basophils Absolute: 0 10*3/uL (ref 0.0–0.1)
EOS%: 1.4 % (ref 0.0–7.0)
Eosinophils Absolute: 0.1 10*3/uL (ref 0.0–0.5)
HCT: 22.6 % — ABNORMAL LOW (ref 34.8–46.6)
HGB: 7.6 g/dL — ABNORMAL LOW (ref 11.6–15.9)
LYMPH%: 12.3 % — ABNORMAL LOW (ref 14.0–49.7)
MCH: 35.8 pg — ABNORMAL HIGH (ref 25.1–34.0)
MCHC: 33.6 g/dL (ref 31.5–36.0)
MCV: 106.6 fL — ABNORMAL HIGH (ref 79.5–101.0)
MONO#: 1 10*3/uL — ABNORMAL HIGH (ref 0.1–0.9)
MONO%: 23.9 % — ABNORMAL HIGH (ref 0.0–14.0)
NEUT#: 2.6 10*3/uL (ref 1.5–6.5)
NEUT%: 61.9 % (ref 38.4–76.8)
Platelets: 211 10*3/uL (ref 145–400)
RBC: 2.12 10*6/uL — ABNORMAL LOW (ref 3.70–5.45)
RDW: 23.1 % — ABNORMAL HIGH (ref 11.2–14.5)
WBC: 4.2 10*3/uL (ref 3.9–10.3)
lymph#: 0.5 10*3/uL — ABNORMAL LOW (ref 0.9–3.3)
nRBC: 0 % (ref 0–0)

## 2017-01-23 MED ORDER — DARBEPOETIN ALFA 300 MCG/0.6ML IJ SOSY
300.0000 ug | PREFILLED_SYRINGE | Freq: Once | INTRAMUSCULAR | Status: AC
  Administered 2017-01-23: 300 ug via SUBCUTANEOUS
  Filled 2017-01-23: qty 0.6

## 2017-01-23 NOTE — Progress Notes (Signed)
Pt Hemoglobin noted at 7.6, denies difficulty breathing, chest pain, stated that she "felt fine". Pt instructed to call office at once  Or seek medical help for difficulty breathing, chest pain or bleeding noted. Pt verbalized understanding of instructions

## 2017-01-23 NOTE — Patient Instructions (Signed)
Darbepoetin Alfa injection What is this medicine? DARBEPOETIN ALFA (dar be POE e tin AL fa) helps your body make more red blood cells. It is used to treat anemia caused by chronic kidney failure and chemotherapy. This medicine may be used for other purposes; ask your health care provider or pharmacist if you have questions. What should I tell my health care provider before I take this medicine? They need to know if you have any of these conditions: -blood clotting disorders or history of blood clots -cancer patient not on chemotherapy -cystic fibrosis -heart disease, such as angina, heart failure, or a history of a heart attack -hemoglobin level of 12 g/dL or greater -high blood pressure -low levels of folate, iron, or vitamin B12 -seizures -an unusual or allergic reaction to darbepoetin, erythropoietin, albumin, hamster proteins, latex, other medicines, foods, dyes, or preservatives -pregnant or trying to get pregnant -breast-feeding How should I use this medicine? This medicine is for injection into a vein or under the skin. It is usually given by a health care professional in a hospital or clinic setting. If you get this medicine at home, you will be taught how to prepare and give this medicine. Do not shake the solution before you withdraw a dose. Use exactly as directed. Take your medicine at regular intervals. Do not take your medicine more often than directed. It is important that you put your used needles and syringes in a special sharps container. Do not put them in a trash can. If you do not have a sharps container, call your pharmacist or healthcare provider to get one. Talk to your pediatrician regarding the use of this medicine in children. While this medicine may be used in children as young as 1 year for selected conditions, precautions do apply. Overdosage: If you think you have taken too much of this medicine contact a poison control center or emergency room at once. NOTE:  This medicine is only for you. Do not share this medicine with others. What if I miss a dose? If you miss a dose, take it as soon as you can. If it is almost time for your next dose, take only that dose. Do not take double or extra doses. What may interact with this medicine? Do not take this medicine with any of the following medications: -epoetin alfa This list may not describe all possible interactions. Give your health care provider a list of all the medicines, herbs, non-prescription drugs, or dietary supplements you use. Also tell them if you smoke, drink alcohol, or use illegal drugs. Some items may interact with your medicine. What should I watch for while using this medicine? Visit your prescriber or health care professional for regular checks on your progress and for the needed blood tests and blood pressure measurements. It is especially important for the doctor to make sure your hemoglobin level is in the desired range, to limit the risk of potential side effects and to give you the best benefit. Keep all appointments for any recommended tests. Check your blood pressure as directed. Ask your doctor what your blood pressure should be and when you should contact him or her. As your body makes more red blood cells, you may need to take iron, folic acid, or vitamin B supplements. Ask your doctor or health care provider which products are right for you. If you have kidney disease continue dietary restrictions, even though this medication can make you feel better. Talk with your doctor or health care professional about the   foods you eat and the vitamins that you take. What side effects may I notice from receiving this medicine? Side effects that you should report to your doctor or health care professional as soon as possible: -allergic reactions like skin rash, itching or hives, swelling of the face, lips, or tongue -breathing problems -changes in vision -chest pain -confusion, trouble speaking  or understanding -feeling faint or lightheaded, falls -high blood pressure -muscle aches or pains -pain, swelling, warmth in the leg -rapid weight gain -severe headaches -sudden numbness or weakness of the face, arm or leg -trouble walking, dizziness, loss of balance or coordination -seizures (convulsions) -swelling of the ankles, feet, hands -unusually weak or tired Side effects that usually do not require medical attention (report to your doctor or health care professional if they continue or are bothersome): -diarrhea -fever, chills (flu-like symptoms) -headaches -nausea, vomiting -redness, stinging, or swelling at site where injected This list may not describe all possible side effects. Call your doctor for medical advice about side effects. You may report side effects to FDA at 1-800-FDA-1088. Where should I keep my medicine? Keep out of the reach of children. Store in a refrigerator between 2 and 8 degrees C (36 and 46 degrees F). Do not freeze. Do not shake. Throw away any unused portion if using a single-dose vial. Throw away any unused medicine after the expiration date. NOTE: This sheet is a summary. It may not cover all possible information. If you have questions about this medicine, talk to your doctor, pharmacist, or health care provider.    2016, Elsevier/Gold Standard. (2008-06-14 10:23:57)  

## 2017-02-06 ENCOUNTER — Other Ambulatory Visit: Payer: Self-pay | Admitting: *Deleted

## 2017-02-06 ENCOUNTER — Ambulatory Visit (HOSPITAL_COMMUNITY)
Admission: RE | Admit: 2017-02-06 | Discharge: 2017-02-06 | Disposition: A | Discharge status: Home / Self Care | Payer: BC Managed Care – PPO | Source: Ambulatory Visit | Attending: Oncology | Admitting: Oncology

## 2017-02-06 ENCOUNTER — Ambulatory Visit (HOSPITAL_BASED_OUTPATIENT_CLINIC_OR_DEPARTMENT_OTHER): Payer: BC Managed Care – PPO

## 2017-02-06 ENCOUNTER — Ambulatory Visit: Payer: BC Managed Care – PPO

## 2017-02-06 ENCOUNTER — Other Ambulatory Visit (HOSPITAL_BASED_OUTPATIENT_CLINIC_OR_DEPARTMENT_OTHER): Payer: BC Managed Care – PPO

## 2017-02-06 VITALS — BP 175/50 | HR 61 | Temp 98.5°F | Resp 18

## 2017-02-06 DIAGNOSIS — N189 Chronic kidney disease, unspecified: Secondary | ICD-10-CM

## 2017-02-06 DIAGNOSIS — N181 Chronic kidney disease, stage 1: Secondary | ICD-10-CM

## 2017-02-06 DIAGNOSIS — D631 Anemia in chronic kidney disease: Secondary | ICD-10-CM

## 2017-02-06 DIAGNOSIS — D500 Iron deficiency anemia secondary to blood loss (chronic): Secondary | ICD-10-CM

## 2017-02-06 LAB — CBC WITH DIFFERENTIAL/PLATELET
BASO%: 1.2 % (ref 0.0–2.0)
Basophils Absolute: 0 10*3/uL (ref 0.0–0.1)
EOS%: 1.3 % (ref 0.0–7.0)
Eosinophils Absolute: 0 10*3/uL (ref 0.0–0.5)
HCT: 21.4 % — ABNORMAL LOW (ref 34.8–46.6)
HGB: 7.3 g/dL — ABNORMAL LOW (ref 11.6–15.9)
LYMPH%: 16.7 % (ref 14.0–49.7)
MCH: 37.8 pg — ABNORMAL HIGH (ref 25.1–34.0)
MCHC: 33.9 g/dL (ref 31.5–36.0)
MCV: 111.5 fL — ABNORMAL HIGH (ref 79.5–101.0)
MONO#: 0.9 10*3/uL (ref 0.1–0.9)
MONO%: 26.5 % — ABNORMAL HIGH (ref 0.0–14.0)
NEUT#: 1.8 10*3/uL (ref 1.5–6.5)
NEUT%: 54.3 % (ref 38.4–76.8)
Platelets: 224 10*3/uL (ref 145–400)
RBC: 1.92 10*6/uL — ABNORMAL LOW (ref 3.70–5.45)
RDW: 26.7 % — ABNORMAL HIGH (ref 11.2–14.5)
WBC: 3.3 10*3/uL — ABNORMAL LOW (ref 3.9–10.3)
lymph#: 0.5 10*3/uL — ABNORMAL LOW (ref 0.9–3.3)

## 2017-02-06 LAB — PREPARE RBC (CROSSMATCH)

## 2017-02-06 MED ORDER — DIPHENHYDRAMINE HCL 25 MG PO CAPS
ORAL_CAPSULE | ORAL | Status: AC
  Filled 2017-02-06: qty 1

## 2017-02-06 MED ORDER — SODIUM CHLORIDE 0.9 % IV SOLN
250.0000 mL | Freq: Once | INTRAVENOUS | Status: AC
  Administered 2017-02-06: 250 mL via INTRAVENOUS

## 2017-02-06 MED ORDER — DARBEPOETIN ALFA 300 MCG/0.6ML IJ SOSY
300.0000 ug | PREFILLED_SYRINGE | Freq: Once | INTRAMUSCULAR | Status: AC
  Administered 2017-02-06: 300 ug via SUBCUTANEOUS
  Filled 2017-02-06: qty 0.6

## 2017-02-06 MED ORDER — ACETAMINOPHEN 325 MG PO TABS
650.0000 mg | ORAL_TABLET | Freq: Once | ORAL | Status: AC
  Administered 2017-02-06: 650 mg via ORAL

## 2017-02-06 MED ORDER — DIPHENHYDRAMINE HCL 25 MG PO CAPS
25.0000 mg | ORAL_CAPSULE | Freq: Once | ORAL | Status: AC
  Administered 2017-02-06: 25 mg via ORAL

## 2017-02-06 MED ORDER — ACETAMINOPHEN 325 MG PO TABS
ORAL_TABLET | ORAL | Status: AC
  Filled 2017-02-06: qty 2

## 2017-02-06 NOTE — Progress Notes (Signed)
Aransesp Injection is to be given in Infusion area today

## 2017-02-06 NOTE — Progress Notes (Signed)
Per Dr. Alen Blew, it's OK to give the Aranesp with today's BP. Infusion room nurse notified.

## 2017-02-06 NOTE — Patient Instructions (Signed)
Darbepoetin Alfa injection What is this medicine? DARBEPOETIN ALFA (dar be POE e tin AL fa) helps your body make more red blood cells. It is used to treat anemia caused by chronic kidney failure and chemotherapy. This medicine may be used for other purposes; ask your health care provider or pharmacist if you have questions. What should I tell my health care provider before I take this medicine? They need to know if you have any of these conditions: -blood clotting disorders or history of blood clots -cancer patient not on chemotherapy -cystic fibrosis -heart disease, such as angina, heart failure, or a history of a heart attack -hemoglobin level of 12 g/dL or greater -high blood pressure -low levels of folate, iron, or vitamin B12 -seizures -an unusual or allergic reaction to darbepoetin, erythropoietin, albumin, hamster proteins, latex, other medicines, foods, dyes, or preservatives -pregnant or trying to get pregnant -breast-feeding How should I use this medicine? This medicine is for injection into a vein or under the skin. It is usually given by a health care professional in a hospital or clinic setting. If you get this medicine at home, you will be taught how to prepare and give this medicine. Do not shake the solution before you withdraw a dose. Use exactly as directed. Take your medicine at regular intervals. Do not take your medicine more often than directed. It is important that you put your used needles and syringes in a special sharps container. Do not put them in a trash can. If you do not have a sharps container, call your pharmacist or healthcare provider to get one. Talk to your pediatrician regarding the use of this medicine in children. While this medicine may be used in children as young as 1 year for selected conditions, precautions do apply. Overdosage: If you think you have taken too much of this medicine contact a poison control center or emergency room at once. NOTE:  This medicine is only for you. Do not share this medicine with others. What if I miss a dose? If you miss a dose, take it as soon as you can. If it is almost time for your next dose, take only that dose. Do not take double or extra doses. What may interact with this medicine? Do not take this medicine with any of the following medications: -epoetin alfa This list may not describe all possible interactions. Give your health care provider a list of all the medicines, herbs, non-prescription drugs, or dietary supplements you use. Also tell them if you smoke, drink alcohol, or use illegal drugs. Some items may interact with your medicine. What should I watch for while using this medicine? Visit your prescriber or health care professional for regular checks on your progress and for the needed blood tests and blood pressure measurements. It is especially important for the doctor to make sure your hemoglobin level is in the desired range, to limit the risk of potential side effects and to give you the best benefit. Keep all appointments for any recommended tests. Check your blood pressure as directed. Ask your doctor what your blood pressure should be and when you should contact him or her. As your body makes more red blood cells, you may need to take iron, folic acid, or vitamin B supplements. Ask your doctor or health care provider which products are right for you. If you have kidney disease continue dietary restrictions, even though this medication can make you feel better. Talk with your doctor or health care professional about the   foods you eat and the vitamins that you take. What side effects may I notice from receiving this medicine? Side effects that you should report to your doctor or health care professional as soon as possible: -allergic reactions like skin rash, itching or hives, swelling of the face, lips, or tongue -breathing problems -changes in vision -chest pain -confusion, trouble speaking  or understanding -feeling faint or lightheaded, falls -high blood pressure -muscle aches or pains -pain, swelling, warmth in the leg -rapid weight gain -severe headaches -sudden numbness or weakness of the face, arm or leg -trouble walking, dizziness, loss of balance or coordination -seizures (convulsions) -swelling of the ankles, feet, hands -unusually weak or tired Side effects that usually do not require medical attention (report to your doctor or health care professional if they continue or are bothersome): -diarrhea -fever, chills (flu-like symptoms) -headaches -nausea, vomiting -redness, stinging, or swelling at site where injected This list may not describe all possible side effects. Call your doctor for medical advice about side effects. You may report side effects to FDA at 1-800-FDA-1088. Where should I keep my medicine? Keep out of the reach of children. Store in a refrigerator between 2 and 8 degrees C (36 and 46 degrees F). Do not freeze. Do not shake. Throw away any unused portion if using a single-dose vial. Throw away any unused medicine after the expiration date. NOTE: This sheet is a summary. It may not cover all possible information. If you have questions about this medicine, talk to your doctor, pharmacist, or health care provider.    2016, Elsevier/Gold Standard. (2008-06-14 10:23:57)  Anemia, Nonspecific Anemia is a condition in which the concentration of red blood cells or hemoglobin in the blood is below normal. Hemoglobin is a substance in red blood cells that carries oxygen to the tissues of the body. Anemia results in not enough oxygen reaching these tissues. What are the causes? Common causes of anemia include:  Excessive bleeding. Bleeding may be internal or external. This includes excessive bleeding from periods (in women) or from the intestine.  Poor nutrition.  Chronic kidney, thyroid, and liver disease.  Bone marrow disorders that decrease red  blood cell production.  Cancer and treatments for cancer.  HIV, AIDS, and their treatments.  Spleen problems that increase red blood cell destruction.  Blood disorders.  Excess destruction of red blood cells due to infection, medicines, and autoimmune disorders. What are the signs or symptoms?  Minor weakness.  Dizziness.  Headache.  Palpitations.  Shortness of breath, especially with exercise.  Paleness.  Cold sensitivity.  Indigestion.  Nausea.  Difficulty sleeping.  Difficulty concentrating. Symptoms may occur suddenly or they may develop slowly. How is this diagnosed? Additional blood tests are often needed. These help your health care provider determine the best treatment. Your health care provider will check your stool for blood and look for other causes of blood loss. How is this treated? Treatment varies depending on the cause of the anemia. Treatment can include:  Supplements of iron, vitamin M22, or folic acid.  Hormone medicines.  A blood transfusion. This may be needed if blood loss is severe.  Hospitalization. This may be needed if there is significant continual blood loss.  Dietary changes.  Spleen removal. Follow these instructions at home: Keep all follow-up appointments. It often takes many weeks to correct anemia, and having your health care provider check on your condition and your response to treatment is very important. Get help right away if:  You develop extreme weakness,  shortness of breath, or chest pain.  You become dizzy or have trouble concentrating.  You develop heavy vaginal bleeding.  You develop a rash.  You have bloody or black, tarry stools.  You faint.  You vomit up blood.  You vomit repeatedly.  You have abdominal pain.  You have a fever or persistent symptoms for more than 2-3 days.  You have a fever and your symptoms suddenly get worse.  You are dehydrated. This information is not intended to replace  advice given to you by your health care provider. Make sure you discuss any questions you have with your health care provider. Document Released: 08/08/2004 Document Revised: 12/13/2015 Document Reviewed: 12/25/2012 Elsevier Interactive Patient Education  2017 [place].     Blood Transfusion, Care After This sheet gives you information about how to care for yourself after your procedure. Your doctor may also give you more specific instructions. If you have problems or questions, contact your doctor. Follow these instructions at home:  Take over-the-counter and prescription medicines only as told by your doctor.  Go back to your normal activities as told by your doctor.  Follow instructions from your doctor about how to take care of the area where an IV tube was put into your vein (insertion site). Make sure you:  Wash your hands with soap and water before you change your bandage (dressing). If there is no soap and water, use hand sanitizer.  Change your bandage as told by your doctor.  Check your IV insertion site every day for signs of infection. Check for:  More redness, swelling, or pain.  More fluid or blood.  Warmth.  Pus or a bad smell. Contact a doctor if:  You have more redness, swelling, or pain around the IV insertion site..  You have more fluid or blood coming from the IV insertion site.  Your IV insertion site feels warm to the touch.  You have pus or a bad smell coming from the IV insertion site.  Your pee (urine) turns pink, red, or brown.  You feel weak after doing your normal activities. Get help right away if:  You have signs of a serious allergic or body defense (immune) system reaction, including:  Itchiness.  Hives.  Trouble breathing.  Anxiety.  Pain in your chest or lower back.  Fever, flushing, and chills.  Fast pulse.  Rash.  Watery poop (diarrhea).  Throwing up (vomiting).  Dark pee.  Serious  headache.  Dizziness.  Stiff neck.  Yellow color in your face or the white parts of your eyes (jaundice). Summary  After a blood transfusion, return to your normal activities as told by your doctor.  Every day, check for signs of infection where the IV tube was put into your vein.  Some signs of infection are warm skin, more redness and pain, more fluid or blood, and pus or a bad smell where the needle went in.  Contact your doctor if you feel weak or have any unusual symptoms. This information is not intended to replace advice given to you by your health care provider. Make sure you discuss any questions you have with your health care provider. Document Released: 07/22/2014 Document Revised: 02/23/2016 Document Reviewed: 02/23/2016 Elsevier Interactive Patient Education  2017 [employer].

## 2017-02-07 LAB — BPAM RBC
Blood Product Expiration Date: 201808132359
ISSUE DATE / TIME: 201807261159
Unit Type and Rh: 600

## 2017-02-07 LAB — TYPE AND SCREEN
ABO/RH(D): A NEG
Antibody Screen: NEGATIVE
Unit division: 0

## 2017-02-12 ENCOUNTER — Ambulatory Visit (HOSPITAL_COMMUNITY)
Admission: RE | Admit: 2017-02-12 | Discharge: 2017-02-12 | Disposition: A | Discharge status: Home / Self Care | Payer: BC Managed Care – PPO | Source: Ambulatory Visit | Attending: Oncology | Admitting: Oncology

## 2017-02-12 DIAGNOSIS — N189 Chronic kidney disease, unspecified: Secondary | ICD-10-CM | POA: Insufficient documentation

## 2017-02-12 DIAGNOSIS — D631 Anemia in chronic kidney disease: Secondary | ICD-10-CM

## 2017-02-20 ENCOUNTER — Other Ambulatory Visit (HOSPITAL_BASED_OUTPATIENT_CLINIC_OR_DEPARTMENT_OTHER): Payer: BC Managed Care – PPO

## 2017-02-20 ENCOUNTER — Ambulatory Visit (HOSPITAL_BASED_OUTPATIENT_CLINIC_OR_DEPARTMENT_OTHER): Payer: BC Managed Care – PPO

## 2017-02-20 ENCOUNTER — Other Ambulatory Visit: Payer: Self-pay | Admitting: *Deleted

## 2017-02-20 VITALS — BP 158/66 | HR 70 | Temp 97.0°F | Resp 20

## 2017-02-20 DIAGNOSIS — N189 Chronic kidney disease, unspecified: Secondary | ICD-10-CM | POA: Diagnosis present

## 2017-02-20 DIAGNOSIS — D500 Iron deficiency anemia secondary to blood loss (chronic): Secondary | ICD-10-CM

## 2017-02-20 DIAGNOSIS — D631 Anemia in chronic kidney disease: Secondary | ICD-10-CM

## 2017-02-20 DIAGNOSIS — N181 Chronic kidney disease, stage 1: Secondary | ICD-10-CM | POA: Diagnosis not present

## 2017-02-20 LAB — CBC WITH DIFFERENTIAL/PLATELET
BASO%: 0.3 % (ref 0.0–2.0)
Basophils Absolute: 0 10*3/uL (ref 0.0–0.1)
EOS%: 1.6 % (ref 0.0–7.0)
Eosinophils Absolute: 0.1 10*3/uL (ref 0.0–0.5)
HCT: 21.2 % — ABNORMAL LOW (ref 34.8–46.6)
HGB: 7.1 g/dL — ABNORMAL LOW (ref 11.6–15.9)
LYMPH%: 17.2 % (ref 14.0–49.7)
MCH: 36.2 pg — ABNORMAL HIGH (ref 25.1–34.0)
MCHC: 33.5 g/dL (ref 31.5–36.0)
MCV: 108.2 fL — ABNORMAL HIGH (ref 79.5–101.0)
MONO#: 1 10*3/uL — ABNORMAL HIGH (ref 0.1–0.9)
MONO%: 31.7 % — ABNORMAL HIGH (ref 0.0–14.0)
NEUT#: 1.5 10*3/uL (ref 1.5–6.5)
NEUT%: 49.2 % (ref 38.4–76.8)
Platelets: 213 10*3/uL (ref 145–400)
RBC: 1.96 10*6/uL — ABNORMAL LOW (ref 3.70–5.45)
RDW: 24.5 % — ABNORMAL HIGH (ref 11.2–14.5)
WBC: 3.1 10*3/uL — ABNORMAL LOW (ref 3.9–10.3)
lymph#: 0.5 10*3/uL — ABNORMAL LOW (ref 0.9–3.3)
nRBC: 0 % (ref 0–0)

## 2017-02-20 LAB — PREPARE RBC (CROSSMATCH)

## 2017-02-20 MED ORDER — DARBEPOETIN ALFA 300 MCG/0.6ML IJ SOSY
300.0000 ug | PREFILLED_SYRINGE | Freq: Once | INTRAMUSCULAR | Status: AC
  Administered 2017-02-20: 300 ug via SUBCUTANEOUS
  Filled 2017-02-20: qty 0.6

## 2017-02-20 NOTE — Patient Instructions (Signed)
Darbepoetin Alfa injection What is this medicine? DARBEPOETIN ALFA (dar be POE e tin AL fa) helps your body make more red blood cells. It is used to treat anemia caused by chronic kidney failure and chemotherapy. This medicine may be used for other purposes; ask your health care provider or pharmacist if you have questions. What should I tell my health care provider before I take this medicine? They need to know if you have any of these conditions: -blood clotting disorders or history of blood clots -cancer patient not on chemotherapy -cystic fibrosis -heart disease, such as angina, heart failure, or a history of a heart attack -hemoglobin level of 12 g/dL or greater -high blood pressure -low levels of folate, iron, or vitamin B12 -seizures -an unusual or allergic reaction to darbepoetin, erythropoietin, albumin, hamster proteins, latex, other medicines, foods, dyes, or preservatives -pregnant or trying to get pregnant -breast-feeding How should I use this medicine? This medicine is for injection into a vein or under the skin. It is usually given by a health care professional in a hospital or clinic setting. If you get this medicine at home, you will be taught how to prepare and give this medicine. Do not shake the solution before you withdraw a dose. Use exactly as directed. Take your medicine at regular intervals. Do not take your medicine more often than directed. It is important that you put your used needles and syringes in a special sharps container. Do not put them in a trash can. If you do not have a sharps container, call your pharmacist or healthcare provider to get one. Talk to your pediatrician regarding the use of this medicine in children. While this medicine may be used in children as young as 1 year for selected conditions, precautions do apply. Overdosage: If you think you have taken too much of this medicine contact a poison control center or emergency room at once. NOTE:  This medicine is only for you. Do not share this medicine with others. What if I miss a dose? If you miss a dose, take it as soon as you can. If it is almost time for your next dose, take only that dose. Do not take double or extra doses. What may interact with this medicine? Do not take this medicine with any of the following medications: -epoetin alfa This list may not describe all possible interactions. Give your health care provider a list of all the medicines, herbs, non-prescription drugs, or dietary supplements you use. Also tell them if you smoke, drink alcohol, or use illegal drugs. Some items may interact with your medicine. What should I watch for while using this medicine? Visit your prescriber or health care professional for regular checks on your progress and for the needed blood tests and blood pressure measurements. It is especially important for the doctor to make sure your hemoglobin level is in the desired range, to limit the risk of potential side effects and to give you the best benefit. Keep all appointments for any recommended tests. Check your blood pressure as directed. Ask your doctor what your blood pressure should be and when you should contact him or her. As your body makes more red blood cells, you may need to take iron, folic acid, or vitamin B supplements. Ask your doctor or health care provider which products are right for you. If you have kidney disease continue dietary restrictions, even though this medication can make you feel better. Talk with your doctor or health care professional about the   foods you eat and the vitamins that you take. What side effects may I notice from receiving this medicine? Side effects that you should report to your doctor or health care professional as soon as possible: -allergic reactions like skin rash, itching or hives, swelling of the face, lips, or tongue -breathing problems -changes in vision -chest pain -confusion, trouble speaking  or understanding -feeling faint or lightheaded, falls -high blood pressure -muscle aches or pains -pain, swelling, warmth in the leg -rapid weight gain -severe headaches -sudden numbness or weakness of the face, arm or leg -trouble walking, dizziness, loss of balance or coordination -seizures (convulsions) -swelling of the ankles, feet, hands -unusually weak or tired Side effects that usually do not require medical attention (report to your doctor or health care professional if they continue or are bothersome): -diarrhea -fever, chills (flu-like symptoms) -headaches -nausea, vomiting -redness, stinging, or swelling at site where injected This list may not describe all possible side effects. Call your doctor for medical advice about side effects. You may report side effects to FDA at 1-800-FDA-1088. Where should I keep my medicine? Keep out of the reach of children. Store in a refrigerator between 2 and 8 degrees C (36 and 46 degrees F). Do not freeze. Do not shake. Throw away any unused portion if using a single-dose vial. Throw away any unused medicine after the expiration date. NOTE: This sheet is a summary. It may not cover all possible information. If you have questions about this medicine, talk to your doctor, pharmacist, or health care provider.    2016, Elsevier/Gold Standard. (2008-06-14 10:23:57)  

## 2017-02-21 ENCOUNTER — Ambulatory Visit (HOSPITAL_COMMUNITY)
Admission: RE | Admit: 2017-02-21 | Discharge: 2017-02-21 | Disposition: A | Discharge status: Home / Self Care | Payer: BC Managed Care – PPO | Source: Ambulatory Visit | Attending: Oncology | Admitting: Oncology

## 2017-02-21 DIAGNOSIS — N189 Chronic kidney disease, unspecified: Secondary | ICD-10-CM

## 2017-02-21 DIAGNOSIS — D631 Anemia in chronic kidney disease: Secondary | ICD-10-CM

## 2017-02-21 MED ORDER — SODIUM CHLORIDE 0.9% FLUSH: 10.0000 mL | INTRAVENOUS | Status: DC | PRN

## 2017-02-21 MED ORDER — SODIUM CHLORIDE 0.9 % IV SOLN
250.0000 mL | Freq: Once | INTRAVENOUS | Status: AC
  Administered 2017-02-21: 250 mL via INTRAVENOUS

## 2017-02-21 MED ORDER — HEPARIN SOD (PORK) LOCK FLUSH 100 UNIT/ML IV SOLN: 500.0000 [IU] | Freq: Every day | INTRAVENOUS | Status: DC | PRN

## 2017-02-21 MED ORDER — DIPHENHYDRAMINE HCL 25 MG PO CAPS
25.0000 mg | ORAL_CAPSULE | Freq: Once | ORAL | Status: AC
  Administered 2017-02-21: 25 mg via ORAL
  Filled 2017-02-21: qty 1

## 2017-02-21 MED ORDER — ACETAMINOPHEN 325 MG PO TABS
650.0000 mg | ORAL_TABLET | Freq: Once | ORAL | Status: AC
  Administered 2017-02-21: 650 mg via ORAL
  Filled 2017-02-21: qty 2

## 2017-02-21 NOTE — Discharge Instructions (Signed)
Hypertension Hypertension is another name for high blood pressure. High blood pressure forces your heart to work harder to pump blood. This can cause problems over time. There are two numbers in a blood pressure reading. There is a top number (systolic) over a bottom number (diastolic). It is best to have a blood pressure below 120/80. Healthy choices can help lower your blood pressure. You may need medicine to help lower your blood pressure if:  Your blood pressure cannot be lowered with healthy choices.  Your blood pressure is higher than 130/80.  Follow these instructions at home: Eating and drinking  If directed, follow the DASH eating plan. This diet includes: ? Filling half of your plate at each meal with fruits and vegetables. ? Filling one quarter of your plate at each meal with whole grains. Whole grains include whole wheat pasta, brown rice, and whole grain bread. ? Eating or drinking low-fat dairy products, such as skim milk or low-fat yogurt. ? Filling one quarter of your plate at each meal with low-fat (lean) proteins. Low-fat proteins include fish, skinless chicken, eggs, beans, and tofu. ? Avoiding fatty meat, cured and processed meat, or chicken with skin. ? Avoiding premade or processed food.  Eat less than 1,500 mg of salt (sodium) a day.  Limit alcohol use to no more than 1 drink a day for nonpregnant women and 2 drinks a day for men. One drink equals 12 oz of beer, 5 oz of wine, or 1 oz of hard liquor. Lifestyle  Work with your doctor to stay at a healthy weight or to lose weight. Ask your doctor what the best weight is for you.  Get at least 30 minutes of exercise that causes your heart to beat faster (aerobic exercise) most days of the week. This may include walking, swimming, or biking.  Get at least 30 minutes of exercise that strengthens your muscles (resistance exercise) at least 3 days a week. This may include lifting weights or pilates.  Do not use any  products that contain nicotine or tobacco. This includes cigarettes and e-cigarettes. If you need help quitting, ask your doctor.  Check your blood pressure at home as told by your doctor.  Keep all follow-up visits as told by your doctor. This is important. Medicines  Take over-the-counter and prescription medicines only as told by your doctor. Follow directions carefully.  Do not skip doses of blood pressure medicine. The medicine does not work as well if you skip doses. Skipping doses also puts you at risk for problems.  Ask your doctor about side effects or reactions to medicines that you should watch for. Contact a doctor if:  You think you are having a reaction to the medicine you are taking.  You have headaches that keep coming back (recurring).  You feel dizzy.  You have swelling in your ankles.  You have trouble with your vision. Get help right away if:  You get a very bad headache.  You start to feel confused.  You feel weak or numb.  You feel faint.  You get very bad pain in your: ? Chest. ? Belly (abdomen).  You throw up (vomit) more than once.  You have trouble breathing. Summary  Hypertension is another name for high blood pressure.  Making healthy choices can help lower blood pressure. If your blood pressure cannot be controlled with healthy choices, you may need to take medicine. This information is not intended to replace advice given to you by your health care  provider. Make sure you discuss any questions you have with your health care provider. Document Released: 12/18/2007 Document Revised: 05/29/2016 Document Reviewed: 05/29/2016 Elsevier Interactive Patient Education  2018 [place]. Blood Transfusion, Care After This sheet gives you information about how to care for yourself after your procedure. Your doctor may also give you more specific instructions. If you have problems or questions, contact your doctor. Follow these instructions at  home:  Take over-the-counter and prescription medicines only as told by your doctor.  Go back to your normal activities as told by your doctor.  Follow instructions from your doctor about how to take care of the area where an IV tube was put into your vein (insertion site). Make sure you: ? Wash your hands with soap and water before you change your bandage (dressing). If there is no soap and water, use hand sanitizer. ? Change your bandage as told by your doctor.  Check your IV insertion site every day for signs of infection. Check for: ? More redness, swelling, or pain. ? More fluid or blood. ? Warmth. ? Pus or a bad smell. Contact a doctor if:  You have more redness, swelling, or pain around the IV insertion site..  You have more fluid or blood coming from the IV insertion site.  Your IV insertion site feels warm to the touch.  You have pus or a bad smell coming from the IV insertion site.  Your pee (urine) turns pink, red, or brown.  You feel weak after doing your normal activities. Get help right away if:  You have signs of a serious allergic or body defense (immune) system reaction, including: ? Itchiness. ? Hives. ? Trouble breathing. ? Anxiety. ? Pain in your chest or lower back. ? Fever, flushing, and chills. ? Fast pulse. ? Rash. ? Watery poop (diarrhea). ? Throwing up (vomiting). ? Dark pee. ? Serious headache. ? Dizziness. ? Stiff neck. ? Yellow color in your face or the white parts of your eyes (jaundice). Summary  After a blood transfusion, return to your normal activities as told by your doctor.  Every day, check for signs of infection where the IV tube was put into your vein.  Some signs of infection are warm skin, more redness and pain, more fluid or blood, and pus or a bad smell where the needle went in.  Contact your doctor if you feel weak or have any unusual symptoms. This information is not intended to replace advice given to you by your  health care provider. Make sure you discuss any questions you have with your health care provider. Document Released: 07/22/2014 Document Revised: 02/23/2016 Document Reviewed: 02/23/2016 Elsevier Interactive Patient Education  2017 [place] received 2 units of packed red blood cells today.  Pt also advised that if she experiences, dizziness, headache or blurred vision she should CALL 911. She advised to check her b/p once she gets home.

## 2017-02-21 NOTE — Progress Notes (Signed)
Notified nurse of Dr. Alen Blew, Marzetta Board RN, regarding patient's elevated BP post transfusion. Patient asymptomatic; denies blurred vision, headache, alert, oriented, and responsive to staff. Dr. Alen Blew made aware, per Marzetta Board RN, patient is safe for discharge. Educated patient to recheck blood pressure and to seek medical attention if any symptoms of headaches, blurred vision, etc. Patient verbalized understanding.

## 2017-02-21 NOTE — Progress Notes (Addendum)
Provider: Dr. Alen Blew   Procedure: 2 units PRBCs via peripheral IV   Diagnosis Association: Anemia in chronic kidney disease, unspecified CKD stage (N18.9 , D63.1)   Treatment: Patient received 2 units PRBCs via Peripheral IV. Patient tolerated procedure well with no transfusion reaction. Blood pressure elevated post transfusion. Dr. Alen Blew notified and made aware. No new orders at this time. Patient advised to recheck blood pressure at home and educated to call 911 if symptoms of dizziness, headaches, and/or blurred vision occur.  Discharge instructions given to patient and patient states an understanding. Patient alert, oriented, and ambulatory at time of discharge.

## 2017-02-21 NOTE — Progress Notes (Signed)
Pt at the Patient Patricia Mata to receive 2 units of packed red blood cells. Pt's b/p has steadily climbed since the first unit of blood. After the 2nd unit, her b/p is now 200/70 manually. Pt is alert and oriented and denies headache or blurred vision. Attempting to notify Dr. Alen Blew.

## 2017-02-22 LAB — TYPE AND SCREEN
ABO/RH(D): A NEG
Antibody Screen: NEGATIVE
Unit division: 0
Unit division: 0

## 2017-02-22 LAB — BPAM RBC
Blood Product Expiration Date: 201808282359
Blood Product Expiration Date: 201809042359
ISSUE DATE / TIME: 201808100858
ISSUE DATE / TIME: 201808100858
Unit Type and Rh: 600
Unit Type and Rh: 600

## 2017-03-06 ENCOUNTER — Ambulatory Visit (HOSPITAL_BASED_OUTPATIENT_CLINIC_OR_DEPARTMENT_OTHER): Payer: BC Managed Care – PPO | Admitting: Oncology

## 2017-03-06 ENCOUNTER — Telehealth: Payer: Self-pay | Admitting: Oncology

## 2017-03-06 ENCOUNTER — Other Ambulatory Visit (HOSPITAL_BASED_OUTPATIENT_CLINIC_OR_DEPARTMENT_OTHER): Payer: BC Managed Care – PPO

## 2017-03-06 ENCOUNTER — Ambulatory Visit (HOSPITAL_BASED_OUTPATIENT_CLINIC_OR_DEPARTMENT_OTHER): Payer: BC Managed Care – PPO

## 2017-03-06 VITALS — BP 155/55 | HR 67 | Temp 98.0°F | Resp 17 | Ht 63.0 in | Wt 253.0 lb

## 2017-03-06 DIAGNOSIS — D631 Anemia in chronic kidney disease: Secondary | ICD-10-CM

## 2017-03-06 DIAGNOSIS — Z853 Personal history of malignant neoplasm of breast: Secondary | ICD-10-CM | POA: Diagnosis not present

## 2017-03-06 DIAGNOSIS — D469 Myelodysplastic syndrome, unspecified: Secondary | ICD-10-CM | POA: Diagnosis not present

## 2017-03-06 DIAGNOSIS — I10 Essential (primary) hypertension: Secondary | ICD-10-CM | POA: Diagnosis not present

## 2017-03-06 DIAGNOSIS — D500 Iron deficiency anemia secondary to blood loss (chronic): Secondary | ICD-10-CM

## 2017-03-06 DIAGNOSIS — N181 Chronic kidney disease, stage 1: Secondary | ICD-10-CM

## 2017-03-06 LAB — CBC WITH DIFFERENTIAL/PLATELET
BASO%: 1.1 % (ref 0.0–2.0)
Basophils Absolute: 0 10*3/uL (ref 0.0–0.1)
EOS%: 1 % (ref 0.0–7.0)
Eosinophils Absolute: 0 10*3/uL (ref 0.0–0.5)
HCT: 22.4 % — ABNORMAL LOW (ref 34.8–46.6)
HGB: 7.5 g/dL — ABNORMAL LOW (ref 11.6–15.9)
LYMPH%: 14.5 % (ref 14.0–49.7)
MCH: 35.9 pg — ABNORMAL HIGH (ref 25.1–34.0)
MCHC: 33.6 g/dL (ref 31.5–36.0)
MCV: 106.8 fL — ABNORMAL HIGH (ref 79.5–101.0)
MONO#: 1.1 10*3/uL — ABNORMAL HIGH (ref 0.1–0.9)
MONO%: 28.3 % — ABNORMAL HIGH (ref 0.0–14.0)
NEUT#: 2.1 10*3/uL (ref 1.5–6.5)
NEUT%: 55.1 % (ref 38.4–76.8)
Platelets: 207 10*3/uL (ref 145–400)
RBC: 2.1 10*6/uL — ABNORMAL LOW (ref 3.70–5.45)
RDW: 29.7 % — ABNORMAL HIGH (ref 11.2–14.5)
WBC: 3.8 10*3/uL — ABNORMAL LOW (ref 3.9–10.3)
lymph#: 0.6 10*3/uL — ABNORMAL LOW (ref 0.9–3.3)

## 2017-03-06 MED ORDER — DARBEPOETIN ALFA 300 MCG/0.6ML IJ SOSY
300.0000 ug | PREFILLED_SYRINGE | Freq: Once | INTRAMUSCULAR | Status: AC
  Administered 2017-03-06: 300 ug via SUBCUTANEOUS
  Filled 2017-03-06: qty 0.6

## 2017-03-06 NOTE — Progress Notes (Signed)
Hematology and Oncology Follow Up Visit  Patricia Mata [id number] 1956/03/15 61 y.o. 03/06/2017 9:44 AM   Principle Diagnosis: 61 year old with:  1. T3 N1 colon cancer in 10/2006. She had been in remission since the completion of therapy in 2008. 2. 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 Her IPSS-R score was calculated and based on her characteristics she has a score of 2.5.    Prior Therapy: She is S/P hemicolectomy done in 11/2006. 1/13 lymph nodes are positive. (T3N1). She is S/P 12 cycles of FOLFOX completed in 05/2007  Current therapy: Aranesp to 300 g every 2 weeks to keep her hemoglobin above 10.  Interim History:  Patricia Mata return today for a follow up visit. Since the last visit, she reports feeling reasonably well without any complaints. She has resumed work-related duties full time without any decline ability to do so. She denied any hematochezia or melena. She denied any easy bruisability. She denied any constitutional symptoms. She does require periodic transfusions close to 3 months.  She denied any complications related to Aranesp at this time. She denied any thrombosis or bleeding complications.  She is not report any headaches blurry vision, syncope or seizures. She does not report any fevers, chills sweats or weight loss. He does not report any chest pain, palpitation orthopnea or leg edema. She does not report any cough, wheezing or hemoptysis. She does not report any nausea, vomiting or abdominal pain. She does not report any frequency urgency or hesitancy. She does not report any skeletal complaints. Remaining review of systems unremarkable.  Medications: I have reviewed the patient's current medications.   Allergies:  Allergies  Allergen Reactions  . Sulfa Antibiotics Rash and Other (See Comments)    blisters     Physical  Exam: Blood pressure (!) 155/55, pulse 67, temperature 98 F (36.7 C), temperature source Oral, resp. rate 17, height '5\' 3"'  (1.6 m), weight 253 lb (114.8 kg), SpO2 100 %. ECOG: 0 General appearance: Alert, awake woman without distress. Head: Normocephalic, without obvious abnormality no oral thrush or ulcers. Neck: no adenopathy Lymph nodes: Cervical, supraclavicular, and axillary nodes normal. Heart:regular rate and rhythm, S1, S2 normal, no murmur, click, rub or gallop Lung:chest clear, no wheezing, rales, normal symmetric air entry Abdomin: soft, non-tender, without masses or organomegaly no shifting dullness or ascites. EXT:no erythema, induration, or nodules   Lab Results: Lab Results  Component Value Date   WBC 3.8 (L) 03/06/2017   HGB 7.5 (L) 03/06/2017   HCT 22.4 (L) 03/06/2017   MCV 106.8 (H) 03/06/2017   PLT 207 03/06/2017     Chemistry      Component Value Date/Time   NA 137 05/26/2016 1235   NA 139 03/08/2016 0909   K 4.8 05/26/2016 1235   K 4.3 03/08/2016 0909   CL 103 05/26/2016 1235   CL 96 (L) 03/31/2012 1513   CO2 24 05/26/2016 1235   CO2 23 03/08/2016 0909   BUN 27 (H) 05/26/2016 1235   BUN 27.3 (H) 03/08/2016 0909   CREATININE 1.17 (H) 05/26/2016 1235   CREATININE 1.1 03/08/2016 0909      Component Value Date/Time   CALCIUM 10.2 05/26/2016 1235   CALCIUM 9.6 03/08/2016 0909   ALKPHOS 53 05/26/2016 1235   ALKPHOS 65 03/08/2016 0909   AST 24 05/26/2016 1235   AST 21 03/08/2016 0909   ALT 25  05/26/2016 1235   ALT 31 03/08/2016 0909   BILITOT 1.1 05/26/2016 1235   BILITOT 1.16 03/08/2016 0909       Impression and Plan:  61 year old female with the following issues:  1. Stage III (T3N1) colon cancer. She is 9 years out from the completion of therapy without any evidence to suggest recurrent disease.  2. MDS diagnosed in 2017:  Her bone marrow biopsy obtained on 02/01/2016 showed hypercellular marrow with the dyspoietic changes. 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 Her cytogenetics did not reveal any specific abnormalities. Significant blasts were not noted. Her IPSS-R score was calculated and based on her characteristics she has a score of 2.5.   She is currently on Aranesp 300 g every 2 weeks and a periodic red cell transfusion.  Options of therapy were reviewed with the patient today including starting Vidaza. Risks and benefits were reviewed again today as well as success rate. I am in favor of using Vidaza at this time. She was evaluated by Dr. Florene Glen at [facility] for a second opinion and concurred with these findings. She will require restaging with a bone marrow biopsy before starting this therapy. The logistics of administration of this therapy was reviewed again as well as potential complications such as pancytopenia, infection as well as continuous need for transfusions. 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 She would like to be off work while she is doing this and potentially started later on this fall or this winter.  The time being we'll continue with the current treatment approach with Aranesp and packed red cell transfusion anticipated start Vidaza in the future.  Her hemoglobin is 7.5 today and she is asymptomatic. She'll require Aranesp only today.   3. Hypertension: Usually her blood pressure is under reasonable control.   4. Follow-up: Will be in 2 months.    Zola Button, MD 8/23/20189:44 AM

## 2017-03-06 NOTE — Telephone Encounter (Signed)
Gave patient avs report and appointments for September thru November  °

## 2017-03-06 NOTE — Patient Instructions (Signed)
Darbepoetin Alfa injection What is this medicine? DARBEPOETIN ALFA (dar be POE e tin AL fa) helps your body make more red blood cells. It is used to treat anemia caused by chronic kidney failure and chemotherapy. This medicine may be used for other purposes; ask your health care provider or pharmacist if you have questions. What should I tell my health care provider before I take this medicine? They need to know if you have any of these conditions: -blood clotting disorders or history of blood clots -cancer patient not on chemotherapy -cystic fibrosis -heart disease, such as angina, heart failure, or a history of a heart attack -hemoglobin level of 12 g/dL or greater -high blood pressure -low levels of folate, iron, or vitamin B12 -seizures -an unusual or allergic reaction to darbepoetin, erythropoietin, albumin, hamster proteins, latex, other medicines, foods, dyes, or preservatives -pregnant or trying to get pregnant -breast-feeding How should I use this medicine? This medicine is for injection into a vein or under the skin. It is usually given by a health care professional in a hospital or clinic setting. If you get this medicine at home, you will be taught how to prepare and give this medicine. Do not shake the solution before you withdraw a dose. Use exactly as directed. Take your medicine at regular intervals. Do not take your medicine more often than directed. It is important that you put your used needles and syringes in a special sharps container. Do not put them in a trash can. If you do not have a sharps container, call your pharmacist or healthcare provider to get one. Talk to your pediatrician regarding the use of this medicine in children. While this medicine may be used in children as young as 1 year for selected conditions, precautions do apply. Overdosage: If you think you have taken too much of this medicine contact a poison control center or emergency room at once. NOTE:  This medicine is only for you. Do not share this medicine with others. What if I miss a dose? If you miss a dose, take it as soon as you can. If it is almost time for your next dose, take only that dose. Do not take double or extra doses. What may interact with this medicine? Do not take this medicine with any of the following medications: -epoetin alfa This list may not describe all possible interactions. Give your health care provider a list of all the medicines, herbs, non-prescription drugs, or dietary supplements you use. Also tell them if you smoke, drink alcohol, or use illegal drugs. Some items may interact with your medicine. What should I watch for while using this medicine? Visit your prescriber or health care professional for regular checks on your progress and for the needed blood tests and blood pressure measurements. It is especially important for the doctor to make sure your hemoglobin level is in the desired range, to limit the risk of potential side effects and to give you the best benefit. Keep all appointments for any recommended tests. Check your blood pressure as directed. Ask your doctor what your blood pressure should be and when you should contact him or her. As your body makes more red blood cells, you may need to take iron, folic acid, or vitamin B supplements. Ask your doctor or health care provider which products are right for you. If you have kidney disease continue dietary restrictions, even though this medication can make you feel better. Talk with your doctor or health care professional about the   foods you eat and the vitamins that you take. What side effects may I notice from receiving this medicine? Side effects that you should report to your doctor or health care professional as soon as possible: -allergic reactions like skin rash, itching or hives, swelling of the face, lips, or tongue -breathing problems -changes in vision -chest pain -confusion, trouble speaking  or understanding -feeling faint or lightheaded, falls -high blood pressure -muscle aches or pains -pain, swelling, warmth in the leg -rapid weight gain -severe headaches -sudden numbness or weakness of the face, arm or leg -trouble walking, dizziness, loss of balance or coordination -seizures (convulsions) -swelling of the ankles, feet, hands -unusually weak or tired Side effects that usually do not require medical attention (report to your doctor or health care professional if they continue or are bothersome): -diarrhea -fever, chills (flu-like symptoms) -headaches -nausea, vomiting -redness, stinging, or swelling at site where injected This list may not describe all possible side effects. Call your doctor for medical advice about side effects. You may report side effects to FDA at 1-800-FDA-1088. Where should I keep my medicine? Keep out of the reach of children. Store in a refrigerator between 2 and 8 degrees C (36 and 46 degrees F). Do not freeze. Do not shake. Throw away any unused portion if using a single-dose vial. Throw away any unused medicine after the expiration date. NOTE: This sheet is a summary. It may not cover all possible information. If you have questions about this medicine, talk to your doctor, pharmacist, or health care provider.    2016, Elsevier/Gold Standard. (2008-06-14 10:23:57)  

## 2017-03-18 ENCOUNTER — Ambulatory Visit (HOSPITAL_COMMUNITY)
Admission: RE | Admit: 2017-03-18 | Discharge: 2017-03-18 | Disposition: A | Discharge status: Home / Self Care | Payer: BC Managed Care – PPO | Source: Ambulatory Visit | Attending: Oncology | Admitting: Oncology

## 2017-03-18 DIAGNOSIS — D500 Iron deficiency anemia secondary to blood loss (chronic): Secondary | ICD-10-CM | POA: Insufficient documentation

## 2017-03-18 DIAGNOSIS — D649 Anemia, unspecified: Secondary | ICD-10-CM

## 2017-03-19 ENCOUNTER — Telehealth: Payer: Self-pay

## 2017-03-19 NOTE — Telephone Encounter (Signed)
Pt called to let Dr Alen Blew know what happened last time she received blood on 8/10. She started swelling on 8/12  And her abdomen started swelling a couple weeks ago.  Her PCP rx some furosemide 20 mg daily for 7 days, she has 2 left. This has not helped the swelling. She is still feeling heavy in her legs, stomach looks 9 months pregnant.   Can she be seen tomorrow by MD? She has lab and possible blood at 1130.

## 2017-03-20 ENCOUNTER — Encounter: Payer: Self-pay | Admitting: *Deleted

## 2017-03-20 ENCOUNTER — Other Ambulatory Visit: Payer: Self-pay | Admitting: Oncology

## 2017-03-20 ENCOUNTER — Other Ambulatory Visit: Payer: Self-pay | Admitting: *Deleted

## 2017-03-20 ENCOUNTER — Other Ambulatory Visit (HOSPITAL_BASED_OUTPATIENT_CLINIC_OR_DEPARTMENT_OTHER): Payer: BC Managed Care – PPO

## 2017-03-20 ENCOUNTER — Telehealth: Payer: Self-pay | Admitting: *Deleted

## 2017-03-20 ENCOUNTER — Ambulatory Visit (HOSPITAL_BASED_OUTPATIENT_CLINIC_OR_DEPARTMENT_OTHER): Payer: BC Managed Care – PPO

## 2017-03-20 VITALS — BP 186/50 | HR 72 | Temp 98.1°F | Resp 18

## 2017-03-20 DIAGNOSIS — Z7189 Other specified counseling: Secondary | ICD-10-CM | POA: Insufficient documentation

## 2017-03-20 DIAGNOSIS — D500 Iron deficiency anemia secondary to blood loss (chronic): Secondary | ICD-10-CM

## 2017-03-20 DIAGNOSIS — N181 Chronic kidney disease, stage 1: Secondary | ICD-10-CM

## 2017-03-20 DIAGNOSIS — D631 Anemia in chronic kidney disease: Secondary | ICD-10-CM

## 2017-03-20 DIAGNOSIS — D649 Anemia, unspecified: Secondary | ICD-10-CM

## 2017-03-20 DIAGNOSIS — D469 Myelodysplastic syndrome, unspecified: Secondary | ICD-10-CM | POA: Insufficient documentation

## 2017-03-20 LAB — CBC WITH DIFFERENTIAL/PLATELET
BASO%: 0.7 % (ref 0.0–2.0)
Basophils Absolute: 0 10*3/uL (ref 0.0–0.1)
EOS%: 0.4 % (ref 0.0–7.0)
Eosinophils Absolute: 0 10*3/uL (ref 0.0–0.5)
HCT: 21.5 % — ABNORMAL LOW (ref 34.8–46.6)
HGB: 7.3 g/dL — ABNORMAL LOW (ref 11.6–15.9)
LYMPH%: 15.5 % (ref 14.0–49.7)
MCH: 37.5 pg — ABNORMAL HIGH (ref 25.1–34.0)
MCHC: 33.8 g/dL (ref 31.5–36.0)
MCV: 110.7 fL — ABNORMAL HIGH (ref 79.5–101.0)
MONO#: 1.2 10*3/uL — ABNORMAL HIGH (ref 0.1–0.9)
MONO%: 26.9 % — ABNORMAL HIGH (ref 0.0–14.0)
NEUT#: 2.5 10*3/uL (ref 1.5–6.5)
NEUT%: 56.5 % (ref 38.4–76.8)
Platelets: 237 10*3/uL (ref 145–400)
RBC: 1.95 10*6/uL — ABNORMAL LOW (ref 3.70–5.45)
RDW: 31 % — ABNORMAL HIGH (ref 11.2–14.5)
WBC: 4.5 10*3/uL (ref 3.9–10.3)
lymph#: 0.7 10*3/uL — ABNORMAL LOW (ref 0.9–3.3)

## 2017-03-20 LAB — PREPARE RBC (CROSSMATCH)

## 2017-03-20 MED ORDER — FUROSEMIDE 20 MG PO TABS: 20.0000 mg | ORAL_TABLET | Freq: Every day | ORAL | 0 refills | Status: DC

## 2017-03-20 MED ORDER — SODIUM CHLORIDE 0.9 % IV SOLN
250.0000 mL | Freq: Once | INTRAVENOUS | Status: AC
  Administered 2017-03-20: 250 mL via INTRAVENOUS

## 2017-03-20 MED ORDER — DIPHENHYDRAMINE HCL 25 MG PO TABS
25.0000 mg | ORAL_TABLET | Freq: Once | ORAL | Status: AC
  Administered 2017-03-20: 25 mg via ORAL
  Filled 2017-03-20: qty 1

## 2017-03-20 MED ORDER — FUROSEMIDE 10 MG/ML IJ SOLN
20.0000 mg | Freq: Once | INTRAMUSCULAR | Status: AC
Start: 2017-03-20 — End: 2017-03-20
  Administered 2017-03-20: 20 mg via INTRAVENOUS

## 2017-03-20 MED ORDER — FUROSEMIDE 10 MG/ML IJ SOLN
INTRAMUSCULAR | Status: AC
  Filled 2017-03-20: qty 2

## 2017-03-20 MED ORDER — DIPHENHYDRAMINE HCL 25 MG PO CAPS
ORAL_CAPSULE | ORAL | Status: AC
  Filled 2017-03-20: qty 1

## 2017-03-20 MED ORDER — ACETAMINOPHEN 325 MG PO TABS
650.0000 mg | ORAL_TABLET | Freq: Once | ORAL | Status: AC
  Administered 2017-03-20: 650 mg via ORAL

## 2017-03-20 MED ORDER — ACETAMINOPHEN 325 MG PO TABS
ORAL_TABLET | ORAL | Status: AC
  Filled 2017-03-20: qty 2

## 2017-03-20 MED ORDER — DARBEPOETIN ALFA 300 MCG/0.6ML IJ SOSY
300.0000 ug | PREFILLED_SYRINGE | Freq: Once | INTRAMUSCULAR | Status: AC
  Administered 2017-03-20: 300 ug via SUBCUTANEOUS
  Filled 2017-03-20: qty 0.6

## 2017-03-20 NOTE — Telephone Encounter (Signed)
Per dr Alen Blew, lasix 20 mg tablets #30 e-scribed to [facility] [address]

## 2017-03-20 NOTE — Progress Notes (Signed)
Patient seen Dr. Alen Blew today before arriving to infusion.

## 2017-03-20 NOTE — Patient Instructions (Signed)
Darbepoetin Alfa injection (Aranesp) What is this medicine? DARBEPOETIN ALFA (dar be POE e tin AL fa) helps your body make more red blood cells. It is used to treat anemia caused by chronic kidney failure and chemotherapy. This medicine may be used for other purposes; ask your health care provider or pharmacist if you have questions. COMMON BRAND NAME(S): Aranesp What should I tell my health care provider before I take this medicine? They need to know if you have any of these conditions: -blood clotting disorders or history of blood clots -cancer patient not on chemotherapy -cystic fibrosis -heart disease, such as angina, heart failure, or a history of a heart attack -hemoglobin level of 12 g/dL or greater -high blood pressure -low levels of folate, iron, or vitamin B12 -seizures -an unusual or allergic reaction to darbepoetin, erythropoietin, albumin, hamster proteins, latex, other medicines, foods, dyes, or preservatives -pregnant or trying to get pregnant -breast-feeding How should I use this medicine? This medicine is for injection into a vein or under the skin. It is usually given by a health care professional in a hospital or clinic setting. If you get this medicine at home, you will be taught how to prepare and give this medicine. Use exactly as directed. Take your medicine at regular intervals. Do not take your medicine more often than directed. It is important that you put your used needles and syringes in a special sharps container. Do not put them in a trash can. If you do not have a sharps container, call your pharmacist or healthcare provider to get one. A special MedGuide will be given to you by the pharmacist with each prescription and refill. Be sure to read this information carefully each time. Talk to your pediatrician regarding the use of this medicine in children. While this medicine may be used in children as young as 1 year for selected conditions, precautions do  apply. Overdosage: If you think you have taken too much of this medicine contact a poison control center or emergency room at once. NOTE: This medicine is only for you. Do not share this medicine with others. What if I miss a dose? If you miss a dose, take it as soon as you can. If it is almost time for your next dose, take only that dose. Do not take double or extra doses. What may interact with this medicine? Do not take this medicine with any of the following medications: -epoetin alfa This list may not describe all possible interactions. Give your health care provider a list of all the medicines, herbs, non-prescription drugs, or dietary supplements you use. Also tell them if you smoke, drink alcohol, or use illegal drugs. Some items may interact with your medicine. What should I watch for while using this medicine? Your condition will be monitored carefully while you are receiving this medicine. You may need blood work done while you are taking this medicine. What side effects may I notice from receiving this medicine? Side effects that you should report to your doctor or health care professional as soon as possible: -allergic reactions like skin rash, itching or hives, swelling of the face, lips, or tongue -breathing problems -changes in vision -chest pain -confusion, trouble speaking or understanding -feeling faint or lightheaded, falls -high blood pressure -muscle aches or pains -pain, swelling, warmth in the leg -rapid weight gain -severe headaches -sudden numbness or weakness of the face, arm or leg -trouble walking, dizziness, loss of balance or coordination -seizures (convulsions) -swelling of the ankles, feet,   hands -unusually weak or tired Side effects that usually do not require medical attention (report to your doctor or health care professional if they continue or are bothersome): -diarrhea -fever, chills (flu-like symptoms) -headaches -nausea, vomiting -redness,  stinging, or swelling at site where injected This list may not describe all possible side effects. Call your doctor for medical advice about side effects. You may report side effects to FDA at 1-800-FDA-1088. Where should I keep my medicine? Keep out of the reach of children. Store in a refrigerator between 2 and 8 degrees C (36 and 46 degrees F). Do not freeze. Do not shake. Throw away any unused portion if using a single-dose vial. Throw away any unused medicine after the expiration date. NOTE: This sheet is a summary. It may not cover all possible information. If you have questions about this medicine, talk to your doctor, pharmacist, or health care provider.  2018 Elsevier/Gold Standard (2016-02-19 19:52:26)   Blood Transfusion, Adult A blood transfusion is a procedure in which you receive donated blood, including plasma, platelets, and red blood cells, through an IV tube. You may need a blood transfusion because of illness, surgery, or injury. The blood may come from a donor. You may also be able to donate blood for yourself (autologous blood donation) before a surgery if you know that you might require a blood transfusion. The blood given in a transfusion is made up of different types of cells. You may receive:  Red blood cells. These carry oxygen to the cells in the body.  White blood cells. These help you fight infections.  Platelets. These help your blood to clot.  Plasma. This is the liquid part of your blood and it helps with fluid imbalances.  If you have hemophilia or another clotting disorder, you may also receive other types of blood products. Tell a health care provider about:  Any allergies you have.  All medicines you are taking, including vitamins, herbs, eye drops, creams, and over-the-counter medicines.  Any problems you or family members have had with anesthetic medicines.  Any blood disorders you have.  Any surgeries you have had.  Any medical conditions you  have, including any recent fever or cold symptoms.  Whether you are pregnant or may be pregnant.  Any previous reactions you have had during a blood transfusion. What are the risks? Generally, this is a safe procedure. However, problems may occur, including:  Having an allergic reaction to something in the donated blood. Hives and itching may be symptoms of this type of reaction.  Fever. This may be a reaction to the white blood cells in the transfused blood. Nausea or chest pain may accompany a fever.  Iron overload. This can happen from having many transfusions.  Transfusion-related acute lung injury (TRALI). This is a rare reaction that causes lung damage. The cause is not known.TRALI can occur within hours of a transfusion or several days later.  Sudden (acute) or delayed hemolytic reactions. This happens if your blood does not match the cells in your transfusion. Your body's defense system (immune system) may try to attack the new cells. This complication is rare. The symptoms include fever, chills, nausea, and low back pain or chest pain.  Infection or disease transmission. This is rare.  What happens before the procedure?  You will have a blood test to determine your blood type. This is necessary to know what kind of blood your body will accept and to match it to the donor blood.  If you  are going to have a planned surgery, you may be able to do an autologous blood donation. This may be done in case you need to have a transfusion.  If you have had an allergic reaction to a transfusion in the past, you may be given medicine to help prevent a reaction. This medicine may be given to you by mouth or through an IV tube.  You will have your temperature, blood pressure, and pulse monitored before the transfusion.  Follow instructions from your health care provider about eating and drinking restrictions.  Ask your health care provider about: ? Changing or stopping your regular  medicines. This is especially important if you are taking diabetes medicines or blood thinners. ? Taking medicines such as aspirin and ibuprofen. These medicines can thin your blood. Do not take these medicines before your procedure if your health care provider instructs you not to. What happens during the procedure?  An IV tube will be inserted into one of your veins.  The bag of donated blood will be attached to your IV tube. The blood will then enter through your vein.  Your temperature, blood pressure, and pulse will be monitored regularly during the transfusion. This monitoring is done to detect early signs of a transfusion reaction.  If you have any signs or symptoms of a reaction, your transfusion will be stopped and you may be given medicine.  When the transfusion is complete, your IV tube will be removed.  Pressure may be applied to the IV site for a few minutes.  A bandage (dressing) will be applied. The procedure may vary among health care providers and hospitals. What happens after the procedure?  Your temperature, blood pressure, heart rate, breathing rate, and blood oxygen level will be monitored often.  Your blood may be tested to see how you are responding to the transfusion.  You may be warmed with fluids or blankets to maintain a normal body temperature. Summary  A blood transfusion is a procedure in which you receive donated blood, including plasma, platelets, and red blood cells, through an IV tube.  Your temperature, blood pressure, and pulse will be monitored before, during, and after the transfusion.  Your blood may be tested after the transfusion to see how your body has responded. This information is not intended to replace advice given to you by your health care provider. Make sure you discuss any questions you have with your health care provider. Document Released: 06/28/2000 Document Revised: 03/28/2016 Document Reviewed: 03/28/2016 Elsevier Interactive  Patient Education  [employer].

## 2017-03-20 NOTE — Progress Notes (Signed)
DISCONTINUE ON PATHWAY REGIMEN - MDS     A cycle is every 28 days:     Lenalidomide      Darbepoetin alfa   **Always confirm dose/schedule in your pharmacy ordering system**    REASON: Other Reason PRIOR TREATMENT: [id number]: Lenalidomide 10 mg D1-21 q28 Days + Darbepoetin 200 mcg q2 Weeks TREATMENT RESPONSE: Unable to Evaluate  START ON PATHWAY REGIMEN - MDS     A cycle is every 28 days:     Azacitidine   **Always confirm dose/schedule in your pharmacy ordering system**    Patient Characteristics: Lower-Risk (IPSS-R Score ? 3.5), First Line, Symptomatic Anemia, EPO Level > 500 WHO Disease Classification: MDS-SLD Bone Marrow Blasts (percent): Unknown Cytogenetic Category: Intermediate Platelets (x 10^9/L): Unknown Absolute Neutrophil Count (x 10^9/L): Unknown Line of therapy: First Line IPSS-R Risk Category: Unknown IPSS-R Risk Score: Unknown Check here if patient's risk score was calculated prior to the International Prognostic Scoring System-Revised (IPSS-R): false Hemoglobin (g/dl): < 8 Disease Characteristics: Symptomatic Anemia Patient Characteristics: EPO Level > 500 Intent of Therapy: Non-Curative / Palliative Intent, Discussed with Patient

## 2017-03-21 LAB — TYPE AND SCREEN
ABO/RH(D): A NEG
Antibody Screen: NEGATIVE
Unit division: 0

## 2017-03-21 LAB — BPAM RBC
Blood Product Expiration Date: 201809212359
ISSUE DATE / TIME: 201809061438
Unit Type and Rh: 600

## 2017-04-01 ENCOUNTER — Other Ambulatory Visit: Payer: Self-pay | Admitting: General Surgery

## 2017-04-01 ENCOUNTER — Other Ambulatory Visit: Payer: Self-pay | Admitting: Physician Assistant

## 2017-04-02 ENCOUNTER — Ambulatory Visit (HOSPITAL_COMMUNITY)
Admission: RE | Admit: 2017-04-02 | Discharge: 2017-04-02 | Disposition: A | Discharge status: Home / Self Care | Payer: BC Managed Care – PPO | Source: Ambulatory Visit | Attending: Oncology | Admitting: Oncology

## 2017-04-02 ENCOUNTER — Encounter (HOSPITAL_COMMUNITY): Payer: Self-pay

## 2017-04-02 ENCOUNTER — Other Ambulatory Visit: Payer: Self-pay | Admitting: Oncology

## 2017-04-02 DIAGNOSIS — E119 Type 2 diabetes mellitus without complications: Secondary | ICD-10-CM | POA: Diagnosis not present

## 2017-04-02 DIAGNOSIS — D539 Nutritional anemia, unspecified: Secondary | ICD-10-CM | POA: Insufficient documentation

## 2017-04-02 DIAGNOSIS — Z7984 Long term (current) use of oral hypoglycemic drugs: Secondary | ICD-10-CM | POA: Diagnosis not present

## 2017-04-02 DIAGNOSIS — D469 Myelodysplastic syndrome, unspecified: Secondary | ICD-10-CM

## 2017-04-02 DIAGNOSIS — Z85038 Personal history of other malignant neoplasm of large intestine: Secondary | ICD-10-CM | POA: Diagnosis not present

## 2017-04-02 DIAGNOSIS — I10 Essential (primary) hypertension: Secondary | ICD-10-CM | POA: Diagnosis not present

## 2017-04-02 DIAGNOSIS — Z882 Allergy status to sulfonamides status: Secondary | ICD-10-CM | POA: Insufficient documentation

## 2017-04-02 DIAGNOSIS — Z79899 Other long term (current) drug therapy: Secondary | ICD-10-CM | POA: Insufficient documentation

## 2017-04-02 HISTORY — PX: IR US GUIDE VASC ACCESS RIGHT: IMG2390

## 2017-04-02 HISTORY — PX: IR FLUORO GUIDE PORT INSERTION RIGHT: IMG5741

## 2017-04-02 LAB — CBC WITH DIFFERENTIAL/PLATELET
Basophils Absolute: 0 10*3/uL (ref 0.0–0.1)
Basophils Relative: 0 %
Eosinophils Absolute: 0 10*3/uL (ref 0.0–0.7)
Eosinophils Relative: 0 %
HCT: 21.5 % — ABNORMAL LOW (ref 36.0–46.0)
Hemoglobin: 7.5 g/dL — ABNORMAL LOW (ref 12.0–15.0)
Lymphocytes Relative: 19 %
Lymphs Abs: 0.9 10*3/uL (ref 0.7–4.0)
MCH: 36.4 pg — ABNORMAL HIGH (ref 26.0–34.0)
MCHC: 34.9 g/dL (ref 30.0–36.0)
MCV: 104.4 fL — ABNORMAL HIGH (ref 78.0–100.0)
Monocytes Absolute: 0.9 10*3/uL (ref 0.1–1.0)
Monocytes Relative: 20 %
Neutro Abs: 2.7 10*3/uL (ref 1.7–7.7)
Neutrophils Relative %: 61 %
Platelets: 276 10*3/uL (ref 150–400)
RBC: 2.06 MIL/uL — ABNORMAL LOW (ref 3.87–5.11)
WBC: 4.5 10*3/uL (ref 4.0–10.5)

## 2017-04-02 LAB — GLUCOSE, CAPILLARY: Glucose-Capillary: 87 mg/dL (ref 65–99)

## 2017-04-02 LAB — PROTIME-INR
INR: 1.32
Prothrombin Time: 16.3 s — ABNORMAL HIGH (ref 11.4–15.2)

## 2017-04-02 MED ORDER — LIDOCAINE-EPINEPHRINE (PF) 2 %-1:200000 IJ SOLN
INTRAMUSCULAR | Status: AC
  Filled 2017-04-02: qty 20

## 2017-04-02 MED ORDER — MIDAZOLAM HCL 2 MG/2ML IJ SOLN
INTRAMUSCULAR | Status: AC | PRN
  Administered 2017-04-02 (×3): 1 mg via INTRAVENOUS

## 2017-04-02 MED ORDER — CEFAZOLIN SODIUM-DEXTROSE 2-4 GM/100ML-% IV SOLN
INTRAVENOUS | Status: AC
  Filled 2017-04-02: qty 100

## 2017-04-02 MED ORDER — DIPHENHYDRAMINE HCL 50 MG/ML IJ SOLN
25.0000 mg | Freq: Once | INTRAMUSCULAR | Status: AC
  Administered 2017-04-02: 25 mg via INTRAVENOUS
  Filled 2017-04-02: qty 1

## 2017-04-02 MED ORDER — MIDAZOLAM HCL 2 MG/2ML IJ SOLN
INTRAMUSCULAR | Status: AC
  Filled 2017-04-02: qty 4

## 2017-04-02 MED ORDER — HEPARIN SOD (PORK) LOCK FLUSH 100 UNIT/ML IV SOLN
INTRAVENOUS | Status: AC | PRN
  Administered 2017-04-02: 500 [IU] via INTRAVENOUS

## 2017-04-02 MED ORDER — HEPARIN SOD (PORK) LOCK FLUSH 100 UNIT/ML IV SOLN
INTRAVENOUS | Status: AC
  Filled 2017-04-02: qty 5

## 2017-04-02 MED ORDER — CEFAZOLIN SODIUM-DEXTROSE 2-4 GM/100ML-% IV SOLN
2.0000 g | INTRAVENOUS | Status: AC
  Administered 2017-04-02: 2 g via INTRAVENOUS

## 2017-04-02 MED ORDER — SODIUM CHLORIDE 0.9 % IV SOLN
INTRAVENOUS | Status: DC
  Administered 2017-04-02: 07:00:00 via INTRAVENOUS

## 2017-04-02 MED ORDER — FENTANYL CITRATE (PF) 100 MCG/2ML IJ SOLN
INTRAMUSCULAR | Status: AC | PRN
  Administered 2017-04-02 (×3): 50 ug via INTRAVENOUS

## 2017-04-02 MED ORDER — FENTANYL CITRATE (PF) 100 MCG/2ML IJ SOLN
INTRAMUSCULAR | Status: AC
  Filled 2017-04-02: qty 4

## 2017-04-02 MED ORDER — LIDOCAINE-EPINEPHRINE 1 %-1:100000 IJ SOLN
INTRAMUSCULAR | Status: AC | PRN
  Administered 2017-04-02: 10 mL
  Administered 2017-04-02: 20 mL

## 2017-04-02 NOTE — H&P (Signed)
Referring Physician(s): [name]  Supervising Physician: Arne Cleveland  Patient Status:  [facility]  Chief Complaint:  "I'm here for a port and bone marrow biopsy"  Subjective: Patient familiar to IR service from prior bone marrow biopsy 02/01/16. She has a remote history of colon cancer 2008 and now with anemia/MDS. She is currently on Aranesp and is due to start [place]. She presents today for restaging CT guided bone marrow biopsy as well as Port-A-Cath placement. She currently denies fever, headache, chest pain, dyspnea, cough, abdominal/back pain, nausea, vomiting or bleeding.  Past Medical History:  Diagnosis Date  . Diabetes mellitus without complication ([place])   . Hypertension   . Macrocytic anemia    Past Surgical History:  Procedure Laterality Date  . COLON SURGERY      Allergies: Sulfa antibiotics  Medications: Prior to Admission medications   Medication Sig Start Date End Date Taking? Authorizing Provider  atorvastatin (LIPITOR) 40 MG tablet Take 40 mg by mouth daily.    [provider]  Azilsartan-Chlorthalidone (EDARBYCLOR) 40-12.5 MG TABS Take 1 tablet by mouth daily with lunch.    [provider]  colchicine 0.6 MG tablet Take 0.6 mg by mouth daily with lunch.  11/02/15   [provider]  furosemide (LASIX) 20 MG tablet Take 1 tablet (20 mg total) by mouth daily. 03/20/17   [name], MD  glimepiride (AMARYL) 2 MG tablet Take 2 mg by mouth daily.  10/26/15   [provider]  hydrALAZINE (APRESOLINE) 25 MG tablet Take 50 mg by mouth 2 (two) times daily.    [provider]  latanoprost (XALATAN) 0.005 % ophthalmic solution Place 1 drop into both eyes at bedtime.     [provider]  metoprolol (LOPRESSOR) 50 MG tablet Take 50 mg by mouth 2 (two) times daily.  11/28/15   [provider]  sitaGLIPtin-metformin (JANUMET) 50-1000 MG tablet Take 1 tablet by mouth 2 (two) times daily.     [provider]     Vital Signs:Blood pressure 157/66, heart rate 71, respirations 20, temperature 97.9 ;O2 sat 97% room air    Physical Exam awake, alert. Chest clear to auscultation bilaterally. Heart with regular rate and rhythm, positive murmur. Abdomen obese, soft, positive bowel sounds, nontender. Lower extremities with 2 + edema bilaterally.  Imaging: No results found.  Labs:  CBC:  Recent Labs  02/20/17 1148 03/06/17 0900 03/20/17 1222 04/02/17 0708  WBC 3.1* 3.8* 4.5 4.5  HGB 7.1* 7.5* 7.3* 7.5*  HCT 21.2* 22.4* 21.5* 21.5*  PLT 213 207 237 276    COAGS:  Recent Labs  04/02/17 0708  INR 1.32    BMP:  Recent Labs  05/26/16 1235  NA 137  K 4.8  CL 103  CO2 24  GLUCOSE 242*  BUN 27*  CALCIUM 10.2  CREATININE 1.17*  GFRNONAA 49*  GFRAA 57*    LIVER FUNCTION TESTS:  Recent Labs  05/26/16 1235  BILITOT 1.1  AST 24  ALT 25  ALKPHOS 53  PROT 7.7  ALBUMIN 4.7    Assessment and Plan:  Pt with remote history of colon cancer 2008 and now with anemia/MDS. She is currently on Aranesp and is due to start [place]. She presents today for restaging CT guided bone marrow biopsy as well as Port-A-Cath placement.Risks and benefits discussed with the patient/family including, but not limited to bleeding, infection, damage to adjacent structures or low yield requiring additional tests.All of the patient's questions  were answered, patient is agreeable to proceed.Consent signed and in chart.     Electronically Signed: D. Rowe Robert, PA-C 04/02/2017, 8:19 AM   I spent a total of 25 minutes at the the patient's bedside AND on the patient's hospital floor or unit, greater than 50% of which was counseling/coordinating care for CT-guided bone marrow biopsy and Port-A-Cath placement

## 2017-04-02 NOTE — Discharge Instructions (Signed)
Bone Marrow Aspiration and Bone Marrow Biopsy, Adult, Care After °This sheet gives you information about how to care for yourself after your procedure. Your health care provider may also give you more specific instructions. If you have problems or questions, contact your health care provider. °What can I expect after the procedure? °After the procedure, it is common to have: °· Mild pain and tenderness. °· Swelling. °· Bruising. ° °Follow these instructions at home: °· Take over-the-counter or prescription medicines only as told by your health care provider. °· Do not take baths, swim, or use a hot tub until your health care provider approves. Ask if you can take a shower or have a sponge bath. °· Follow instructions from your health care provider about how to take care of the puncture site. Make sure you: °? Wash your hands with soap and water before you change your bandage (dressing). If soap and water are not available, use hand sanitizer. °? Change your dressing as told by your health care provider. °· Check your puncture site every day for signs of infection. Check for: °? More redness, swelling, or pain. °? More fluid or blood. °? Warmth. °? Pus or a bad smell. °· Return to your normal activities as told by your health care provider. Ask your health care provider what activities are safe for you. °· Do not drive for 24 hours if you were given a medicine to help you relax (sedative). °· Keep all follow-up visits as told by your health care provider. This is important. °Contact a health care provider if: °· You have more redness, swelling, or pain around the puncture site. °· You have more fluid or blood coming from the puncture site. °· Your puncture site feels warm to the touch. °· You have pus or a bad smell coming from the puncture site. °· You have a fever. °· Your pain is not controlled with medicine. °This information is not intended to replace advice given to you by your health care provider. Make sure  you discuss any questions you have with your health care provider. °Document Released: 01/18/2005 Document Revised: 01/19/2016 Document Reviewed: 12/13/2015 °Elsevier Interactive Patient Education © 2018 [employer] °Implanted Port Insertion, Care After °This sheet gives you information about how to care for yourself after your procedure. Your health care provider may also give you more specific instructions. If you have problems or questions, contact your health care provider. °What can I expect after the procedure? °After your procedure, it is common to have: °· Discomfort at the port insertion site. °· Bruising on the skin over the port. This should improve over 3-4 days. ° °Follow these instructions at home: °Port care °· After your port is placed, you will get a manufacturer's information card. The card has information about your port. Keep this card with you at all times. °· Take care of the port as told by your health care provider. Ask your health care provider if you or a family member can get training for taking care of the port at home. A home health care nurse may also take care of the port. °· Make sure to remember what type of port you have. °Incision care °· Follow instructions from your health care provider about how to take care of your port insertion site. Make sure you: °? Wash your hands with soap and water before you change your bandage (dressing). If soap and water are not available, use hand sanitizer. °? Change your dressing as told by your   health care provider. °? Leave stitches (sutures), skin glue, or adhesive strips in place. These skin closures may need to stay in place for 2 weeks or longer. If adhesive strip edges start to loosen and curl up, you may trim the loose edges. Do not remove adhesive strips completely unless your health care provider tells you to do that. °· Check your port insertion site every day for signs of infection. Check for: °? More redness, swelling, or  pain. °? More fluid or blood. °? Warmth. °? Pus or a bad smell. °General instructions °· Do not take baths, swim, or use a hot tub until your health care provider approves. °· Do not lift anything that is heavier than 10 lb (4.5 kg) for a week, or as told by your health care provider. °· Ask your health care provider when it is okay to: °? Return to work or school. °? Resume usual physical activities or sports. °· Do not drive for 24 hours if you were given a medicine to help you relax (sedative). °· Take over-the-counter and prescription medicines only as told by your health care provider. °· Wear a medical alert bracelet in case of an emergency. This will tell any health care providers that you have a port. °· Keep all follow-up visits as told by your health care provider. This is important. °Contact a health care provider if: °· You cannot flush your port with saline as directed, or you cannot draw blood from the port. °· You have a fever or chills. °· You have more redness, swelling, or pain around your port insertion site. °· You have more fluid or blood coming from your port insertion site. °· Your port insertion site feels warm to the touch. °· You have pus or a bad smell coming from the port insertion site. °Get help right away if: °· You have chest pain or shortness of breath. °· You have bleeding from your port that you cannot control. °Summary °· Take care of the port as told by your health care provider. °· Change your dressing as told by your health care provider. °· Keep all follow-up visits as told by your health care provider. °This information is not intended to replace advice given to you by your health care provider. Make sure you discuss any questions you have with your health care provider. °Document Released: 04/21/2013 Document Revised: 05/22/2016 Document Reviewed: 05/22/2016 °Elsevier Interactive Patient Education © 2017 [employer] Inc. °Implanted Port Home Guide °An implanted port is a type  of central line that is placed under the skin. Central lines are used to provide IV access when treatment or nutrition needs to be given through a person’s veins. Implanted ports are used for long-term IV access. An implanted port may be placed because: °· You need IV medicine that would be irritating to the small veins in your hands or arms. °· You need long-term IV medicines, such as antibiotics. °· You need IV nutrition for a long period. °· You need frequent blood draws for lab tests. °· You need dialysis. ° °Implanted ports are usually placed in the chest area, but they can also be placed in the upper arm, the abdomen, or the leg. An implanted port has two main parts: °· Reservoir. The reservoir is round and will appear as a small, raised area under your skin. The reservoir is the part where a needle is inserted to give medicines or draw blood. °· Catheter. The catheter is a thin, flexible tube that extends   from the reservoir. The catheter is placed into a large vein. Medicine that is inserted into the reservoir goes into the catheter and then into the vein.  How will I care for my incision site? Do not get the incision site wet. Bathe or shower as directed by your health care provider. How is my port accessed? Special steps must be taken to access the port:  Before the port is accessed, a numbing cream can be placed on the skin. This helps numb the skin over the port site.  Your health care provider uses a sterile technique to access the port. ? Your health care provider must put on a mask and sterile gloves. ? The skin over your port is cleaned carefully with an antiseptic and allowed to dry. ? The port is gently pinched between sterile gloves, and a needle is inserted into the port.  Only "non-coring" port needles should be used to access the port. Once the port is accessed, a blood return should be checked. This helps ensure that the port is in the vein and is not clogged.  If your port  needs to remain accessed for a constant infusion, a clear (transparent) bandage will be placed over the needle site. The bandage and needle will need to be changed every week, or as directed by your health care provider.  Keep the bandage covering the needle clean and dry. Do not get it wet. Follow your health care providers instructions on how to take a shower or bath while the port is accessed.  If your port does not need to stay accessed, no bandage is needed over the port.  What is flushing? Flushing helps keep the port from getting clogged. Follow your health care providers instructions on how and when to flush the port. Ports are usually flushed with saline solution or a medicine called heparin. The need for flushing will depend on how the port is used.  If the port is used for intermittent medicines or blood draws, the port will need to be flushed: ? After medicines have been given. ? After blood has been drawn. ? As part of routine maintenance.  If a constant infusion is running, the port may not need to be flushed.  How long will my port stay implanted? The port can stay in for as long as your health care provider thinks it is needed. When it is time for the port to come out, surgery will be done to remove it. The procedure is similar to the one performed when the port was put in. When should I seek immediate medical care? When you have an implanted port, you should seek immediate medical care if:  You notice a bad smell coming from the incision site.  You have swelling, redness, or drainage at the incision site.  You have more swelling or pain at the port site or the surrounding area.  You have a fever that is not controlled with medicine.  This information is not intended to replace advice given to you by your health care provider. Make sure you discuss any questions you have with your health care provider. Document Released: 07/01/2005 Document Revised: 12/07/2015 Document  Reviewed: 03/08/2013 Elsevier Interactive Patient Education  2017 [place]. Moderate Conscious Sedation, Adult, Care After These instructions provide you with information about caring for yourself after your procedure. Your health care provider may also give you more specific instructions. Your treatment has been planned according to current medical practices, but problems sometimes occur. Call  your health care provider if you have any problems or questions after your procedure. °What can I expect after the procedure? °After your procedure, it is common: °· To feel sleepy for several hours. °· To feel clumsy and have poor balance for several hours. °· To have poor judgment for several hours. °· To vomit if you eat too soon. ° °Follow these instructions at home: °For at least 24 hours after the procedure: ° °· Do not: °? Participate in activities where you could fall or become injured. °? Drive. °? Use heavy machinery. °? Drink alcohol. °? Take sleeping pills or medicines that cause drowsiness. °? Make important decisions or sign legal documents. °? Take care of children on your own. °· Rest. °Eating and drinking °· Follow the diet recommended by your health care provider. °· If you vomit: °? Drink water, juice, or soup when you can drink without vomiting. °? Make sure you have little or no nausea before eating solid foods. °General instructions °· Have a responsible adult stay with you until you are awake and alert. °· Take over-the-counter and prescription medicines only as told by your health care provider. °· If you smoke, do not smoke without supervision. °· Keep all follow-up visits as told by your health care provider. This is important. °Contact a health care provider if: °· You keep feeling nauseous or you keep vomiting. °· You feel light-headed. °· You develop a rash. °· You have a fever. °Get help right away if: °· You have trouble breathing. °This information is not intended to replace advice  given to you by your health care provider. Make sure you discuss any questions you have with your health care provider. °Document Released: 04/21/2013 Document Revised: 12/04/2015 Document Reviewed: 10/21/2015 °Elsevier Interactive Patient Education © 2018 [employer] Inc. ° °

## 2017-04-02 NOTE — Procedures (Signed)
  Procedure:   CT bone marrow biopsy R iliac Preprocedure diagnosis:  MDS Postprocedure diagnosis:  same EBL:     minimal Complications:   none immediate  See full dictation in [facility].  Dillard Cannon MD Main # [phone] Pager  [phone]

## 2017-04-02 NOTE — Procedures (Signed)
  Procedure:   R IJ Port placement   Preprocedure diagnosis:  MDS Postprocedure diagnosis:  same EBL:     minimal Complications:   none immediate  See full dictation in [facility].  Dillard Cannon MD Main # [phone] Pager  [phone]

## 2017-04-03 ENCOUNTER — Other Ambulatory Visit: Payer: BC Managed Care – PPO

## 2017-04-03 ENCOUNTER — Encounter (HOSPITAL_COMMUNITY): Payer: Self-pay | Admitting: Interventional Radiology

## 2017-04-14 ENCOUNTER — Ambulatory Visit (HOSPITAL_COMMUNITY)
Admission: RE | Admit: 2017-04-14 | Discharge: 2017-04-14 | Disposition: A | Discharge status: Home / Self Care | Payer: BC Managed Care – PPO | Source: Ambulatory Visit | Attending: Oncology | Admitting: Oncology

## 2017-04-14 DIAGNOSIS — D631 Anemia in chronic kidney disease: Secondary | ICD-10-CM

## 2017-04-14 DIAGNOSIS — D500 Iron deficiency anemia secondary to blood loss (chronic): Secondary | ICD-10-CM | POA: Insufficient documentation

## 2017-04-14 DIAGNOSIS — N189 Chronic kidney disease, unspecified: Secondary | ICD-10-CM | POA: Insufficient documentation

## 2017-04-17 ENCOUNTER — Other Ambulatory Visit: Payer: Self-pay | Admitting: *Deleted

## 2017-04-17 ENCOUNTER — Ambulatory Visit (HOSPITAL_BASED_OUTPATIENT_CLINIC_OR_DEPARTMENT_OTHER): Payer: BC Managed Care – PPO

## 2017-04-17 ENCOUNTER — Other Ambulatory Visit: Payer: Self-pay | Admitting: Oncology

## 2017-04-17 ENCOUNTER — Other Ambulatory Visit (HOSPITAL_BASED_OUTPATIENT_CLINIC_OR_DEPARTMENT_OTHER): Payer: BC Managed Care – PPO

## 2017-04-17 VITALS — BP 186/68 | HR 68 | Temp 98.7°F | Resp 17

## 2017-04-17 DIAGNOSIS — D631 Anemia in chronic kidney disease: Secondary | ICD-10-CM | POA: Diagnosis not present

## 2017-04-17 DIAGNOSIS — N181 Chronic kidney disease, stage 1: Secondary | ICD-10-CM | POA: Diagnosis not present

## 2017-04-17 DIAGNOSIS — N189 Chronic kidney disease, unspecified: Principal | ICD-10-CM

## 2017-04-17 DIAGNOSIS — D469 Myelodysplastic syndrome, unspecified: Secondary | ICD-10-CM

## 2017-04-17 DIAGNOSIS — D500 Iron deficiency anemia secondary to blood loss (chronic): Secondary | ICD-10-CM | POA: Diagnosis present

## 2017-04-17 LAB — CBC WITH DIFFERENTIAL/PLATELET
BASO%: 1.4 % (ref 0.0–2.0)
Basophils Absolute: 0.1 10*3/uL (ref 0.0–0.1)
EOS%: 0.7 % (ref 0.0–7.0)
Eosinophils Absolute: 0 10*3/uL (ref 0.0–0.5)
HCT: 17.9 % — ABNORMAL LOW (ref 34.8–46.6)
HGB: 6.1 g/dL — CL (ref 11.6–15.9)
LYMPH%: 15 % (ref 14.0–49.7)
MCH: 38.1 pg — ABNORMAL HIGH (ref 25.1–34.0)
MCHC: 34.1 g/dL (ref 31.5–36.0)
MCV: 111.9 fL — ABNORMAL HIGH (ref 79.5–101.0)
MONO#: 1.4 10*3/uL — ABNORMAL HIGH (ref 0.1–0.9)
MONO%: 32.6 % — ABNORMAL HIGH (ref 0.0–14.0)
NEUT#: 2.2 10*3/uL (ref 1.5–6.5)
NEUT%: 50.3 % (ref 38.4–76.8)
Platelets: 249 10*3/uL (ref 145–400)
RBC: 1.6 10*6/uL — ABNORMAL LOW (ref 3.70–5.45)
WBC: 4.3 10*3/uL (ref 3.9–10.3)
lymph#: 0.6 10*3/uL — ABNORMAL LOW (ref 0.9–3.3)
nRBC: 2 % — ABNORMAL HIGH (ref 0–0)

## 2017-04-17 LAB — COMPREHENSIVE METABOLIC PANEL
ALT: 40 U/L (ref 0–55)
AST: 57 U/L — ABNORMAL HIGH (ref 5–34)
Albumin: 3.5 g/dL (ref 3.5–5.0)
Alkaline Phosphatase: 141 U/L (ref 40–150)
Anion Gap: 13 mEq/L — ABNORMAL HIGH (ref 3–11)
BUN: 41.1 mg/dL — ABNORMAL HIGH (ref 7.0–26.0)
CO2: 20 mEq/L — ABNORMAL LOW (ref 22–29)
Calcium: 8.8 mg/dL (ref 8.4–10.4)
Chloride: 98 mEq/L (ref 98–109)
Creatinine: 1.7 mg/dL — ABNORMAL HIGH (ref 0.6–1.1)
EGFR: 38 mL/min/{1.73_m2} — ABNORMAL LOW (ref >90)
Glucose: 142 mg/dl — ABNORMAL HIGH (ref 70–140)
Potassium: 4.1 mEq/L (ref 3.5–5.1)
Sodium: 131 mEq/L — ABNORMAL LOW (ref 136–145)
Total Bilirubin: 1.23 mg/dL — ABNORMAL HIGH (ref 0.20–1.20)
Total Protein: 6.8 g/dL (ref 6.4–8.3)

## 2017-04-17 LAB — PREPARE RBC (CROSSMATCH)

## 2017-04-17 MED ORDER — DARBEPOETIN ALFA 300 MCG/0.6ML IJ SOSY: 300.0000 ug | PREFILLED_SYRINGE | Freq: Once | INTRAMUSCULAR | Status: DC

## 2017-04-17 MED ORDER — FUROSEMIDE 10 MG/ML IJ SOLN
20.0000 mg | Freq: Once | INTRAMUSCULAR | Status: AC
  Administered 2017-04-17: 20 mg via INTRAVENOUS

## 2017-04-17 MED ORDER — DIPHENHYDRAMINE HCL 25 MG PO CAPS
25.0000 mg | ORAL_CAPSULE | Freq: Once | ORAL | Status: AC
  Administered 2017-04-17: 25 mg via ORAL

## 2017-04-17 MED ORDER — HEPARIN SOD (PORK) LOCK FLUSH 100 UNIT/ML IV SOLN
500.0000 [IU] | Freq: Every day | INTRAVENOUS | Status: AC | PRN
  Administered 2017-04-17: 500 [IU]
  Filled 2017-04-17: qty 5

## 2017-04-17 MED ORDER — ACETAMINOPHEN 325 MG PO TABS
650.0000 mg | ORAL_TABLET | Freq: Once | ORAL | Status: AC
  Administered 2017-04-17: 650 mg via ORAL

## 2017-04-17 MED ORDER — FUROSEMIDE 10 MG/ML IJ SOLN
INTRAMUSCULAR | Status: AC
  Filled 2017-04-17: qty 4

## 2017-04-17 MED ORDER — FUROSEMIDE 20 MG PO TABS: 20.0000 mg | ORAL_TABLET | Freq: Two times a day (BID) | ORAL | 0 refills | Status: DC

## 2017-04-17 MED ORDER — LIDOCAINE-PRILOCAINE 2.5-2.5 % EX CREA: TOPICAL_CREAM | CUTANEOUS | 1 refills | Status: DC

## 2017-04-17 MED ORDER — SODIUM CHLORIDE 0.9% FLUSH
10.0000 mL | INTRAVENOUS | Status: AC | PRN
  Administered 2017-04-17: 10 mL
  Filled 2017-04-17: qty 10

## 2017-04-17 MED ORDER — FUROSEMIDE 10 MG/ML IJ SOLN
20.0000 mg | Freq: Once | INTRAMUSCULAR | Status: DC
  Administered 2017-04-17: 20 mg via INTRAVENOUS

## 2017-04-17 MED ORDER — DIPHENHYDRAMINE HCL 25 MG PO CAPS
ORAL_CAPSULE | ORAL | Status: AC
  Filled 2017-04-17: qty 1

## 2017-04-17 MED ORDER — SODIUM CHLORIDE 0.9 % IV SOLN
250.0000 mL | Freq: Once | INTRAVENOUS | Status: AC
  Administered 2017-04-17: 250 mL via INTRAVENOUS

## 2017-04-17 MED ORDER — ACETAMINOPHEN 325 MG PO TABS
ORAL_TABLET | ORAL | Status: AC
  Filled 2017-04-17: qty 2

## 2017-04-17 NOTE — Progress Notes (Signed)
Per Dr. Alen Blew patient not to receive aranesp today.

## 2017-04-17 NOTE — Patient Instructions (Signed)
Implanted Port Home Guide An implanted port is a type of central line that is placed under the skin. Central lines are used to provide IV access when treatment or nutrition needs to be given through a person's veins. Implanted ports are used for long-term IV access. An implanted port may be placed because:  You need IV medicine that would be irritating to the small veins in your hands or arms.  You need long-term IV medicines, such as antibiotics.  You need IV nutrition for a long period.  You need frequent blood draws for lab tests.  You need dialysis.  Implanted ports are usually placed in the chest area, but they can also be placed in the upper arm, the abdomen, or the leg. An implanted port has two main parts:  Reservoir. The reservoir is round and will appear as a small, raised area under your skin. The reservoir is the part where a needle is inserted to give medicines or draw blood.  Catheter. The catheter is a thin, flexible tube that extends from the reservoir. The catheter is placed into a large vein. Medicine that is inserted into the reservoir goes into the catheter and then into the vein.  How will I care for my incision site? Do not get the incision site wet. Bathe or shower as directed by your health care provider. How is my port accessed? Special steps must be taken to access the port:  Before the port is accessed, a numbing cream can be placed on the skin. This helps numb the skin over the port site.  Your health care provider uses a sterile technique to access the port. ? Your health care provider must put on a mask and sterile gloves. ? The skin over your port is cleaned carefully with an antiseptic and allowed to dry. ? The port is gently pinched between sterile gloves, and a needle is inserted into the port.  Only "non-coring" port needles should be used to access the port. Once the port is accessed, a blood return should be checked. This helps ensure that the port  is in the vein and is not clogged.  If your port needs to remain accessed for a constant infusion, a clear (transparent) bandage will be placed over the needle site. The bandage and needle will need to be changed every week, or as directed by your health care provider.  Keep the bandage covering the needle clean and dry. Do not get it wet. Follow your health care provider's instructions on how to take a shower or bath while the port is accessed.  If your port does not need to stay accessed, no bandage is needed over the port.  What is flushing? Flushing helps keep the port from getting clogged. Follow your health care provider's instructions on how and when to flush the port. Ports are usually flushed with saline solution or a medicine called heparin. The need for flushing will depend on how the port is used.  If the port is used for intermittent medicines or blood draws, the port will need to be flushed: ? After medicines have been given. ? After blood has been drawn. ? As part of routine maintenance.  If a constant infusion is running, the port may not need to be flushed.  How long will my port stay implanted? The port can stay in for as long as your health care provider thinks it is needed. When it is time for the port to come out, surgery will be   done to remove it. The procedure is similar to the one performed when the port was put in. When should I seek immediate medical care? When you have an implanted port, you should seek immediate medical care if:  You notice a bad smell coming from the incision site.  You have swelling, redness, or drainage at the incision site.  You have more swelling or pain at the port site or the surrounding area.  You have a fever that is not controlled with medicine.  This information is not intended to replace advice given to you by your health care provider. Make sure you discuss any questions you have with your health care provider. Document  Released: 07/01/2005 Document Revised: 12/07/2015 Document Reviewed: 03/08/2013 Elsevier Interactive Patient Education  2017 [place]. Blood Transfusion, Adult A blood transfusion is a procedure in which you receive donated blood, including plasma, platelets, and red blood cells, through an IV tube. You may need a blood transfusion because of illness, surgery, or injury. The blood may come from a donor. You may also be able to donate blood for yourself (autologous blood donation) before a surgery if you know that you might require a blood transfusion. The blood given in a transfusion is made up of different types of cells. You may receive:  Red blood cells. These carry oxygen to the cells in the body.  White blood cells. These help you fight infections.  Platelets. These help your blood to clot.  Plasma. This is the liquid part of your blood and it helps with fluid imbalances.  If you have hemophilia or another clotting disorder, you may also receive other types of blood products. Tell a health care provider about:  Any allergies you have.  All medicines you are taking, including vitamins, herbs, eye drops, creams, and over-the-counter medicines.  Any problems you or family members have had with anesthetic medicines.  Any blood disorders you have.  Any surgeries you have had.  Any medical conditions you have, including any recent fever or cold symptoms.  Whether you are pregnant or may be pregnant.  Any previous reactions you have had during a blood transfusion. What are the risks? Generally, this is a safe procedure. However, problems may occur, including:  Having an allergic reaction to something in the donated blood. Hives and itching may be symptoms of this type of reaction.  Fever. This may be a reaction to the white blood cells in the transfused blood. Nausea or chest pain may accompany a fever.  Iron overload. This can happen from having many  transfusions.  Transfusion-related acute lung injury (TRALI). This is a rare reaction that causes lung damage. The cause is not known.TRALI can occur within hours of a transfusion or several days later.  Sudden (acute) or delayed hemolytic reactions. This happens if your blood does not match the cells in your transfusion. Your body's defense system (immune system) may try to attack the new cells. This complication is rare. The symptoms include fever, chills, nausea, and low back pain or chest pain.  Infection or disease transmission. This is rare.  What happens before the procedure?  You will have a blood test to determine your blood type. This is necessary to know what kind of blood your body will accept and to match it to the donor blood.  If you are going to have a planned surgery, you may be able to do an autologous blood donation. This may be done in case you need to have a  transfusion.  If you have had an allergic reaction to a transfusion in the past, you may be given medicine to help prevent a reaction. This medicine may be given to you by mouth or through an IV tube.  You will have your temperature, blood pressure, and pulse monitored before the transfusion.  Follow instructions from your health care provider about eating and drinking restrictions.  Ask your health care provider about: ? Changing or stopping your regular medicines. This is especially important if you are taking diabetes medicines or blood thinners. ? Taking medicines such as aspirin and ibuprofen. These medicines can thin your blood. Do not take these medicines before your procedure if your health care provider instructs you not to. What happens during the procedure?  An IV tube will be inserted into one of your veins.  The bag of donated blood will be attached to your IV tube. The blood will then enter through your vein.  Your temperature, blood pressure, and pulse will be monitored regularly during the  transfusion. This monitoring is done to detect early signs of a transfusion reaction.  If you have any signs or symptoms of a reaction, your transfusion will be stopped and you may be given medicine.  When the transfusion is complete, your IV tube will be removed.  Pressure may be applied to the IV site for a few minutes.  A bandage (dressing) will be applied. The procedure may vary among health care providers and hospitals. What happens after the procedure?  Your temperature, blood pressure, heart rate, breathing rate, and blood oxygen level will be monitored often.  Your blood may be tested to see how you are responding to the transfusion.  You may be warmed with fluids or blankets to maintain a normal body temperature. Summary  A blood transfusion is a procedure in which you receive donated blood, including plasma, platelets, and red blood cells, through an IV tube.  Your temperature, blood pressure, and pulse will be monitored before, during, and after the transfusion.  Your blood may be tested after the transfusion to see how your body has responded. This information is not intended to replace advice given to you by your health care provider. Make sure you discuss any questions you have with your health care provider. Document Released: 06/28/2000 Document Revised: 03/28/2016 Document Reviewed: 03/28/2016 Elsevier Interactive Patient Education  [employer].

## 2017-04-18 ENCOUNTER — Other Ambulatory Visit: Payer: Self-pay | Admitting: Oncology

## 2017-04-18 ENCOUNTER — Telehealth: Payer: Self-pay

## 2017-04-18 LAB — TYPE AND SCREEN
ABO/RH(D): A NEG
Antibody Screen: POSITIVE
DAT, IgG: NEGATIVE
Unit division: 0
Unit division: 0

## 2017-04-18 LAB — BPAM RBC
Blood Product Expiration Date: 201810082359
Blood Product Expiration Date: 201810082359
ISSUE DATE / TIME: 201810041216
ISSUE DATE / TIME: 201810041216
Unit Type and Rh: 9500
Unit Type and Rh: 9500

## 2017-04-18 NOTE — Telephone Encounter (Signed)
Called and left a detailed message concerning upcoming appointment, per 10/5 sch message

## 2017-04-21 ENCOUNTER — Telehealth: Payer: Self-pay | Admitting: Cardiovascular Disease

## 2017-04-21 NOTE — Telephone Encounter (Signed)
Received records from [facility] for appointment on 05/13/17 with Dr Gwenlyn Found.  Records put with Dr Kennon Holter schedule for 05/13/17. lp

## 2017-04-24 ENCOUNTER — Other Ambulatory Visit: Payer: Self-pay | Admitting: Oncology

## 2017-04-28 ENCOUNTER — Ambulatory Visit (HOSPITAL_BASED_OUTPATIENT_CLINIC_OR_DEPARTMENT_OTHER): Payer: BC Managed Care – PPO

## 2017-04-28 ENCOUNTER — Other Ambulatory Visit (HOSPITAL_BASED_OUTPATIENT_CLINIC_OR_DEPARTMENT_OTHER): Payer: BC Managed Care – PPO

## 2017-04-28 ENCOUNTER — Telehealth: Payer: Self-pay | Admitting: *Deleted

## 2017-04-28 ENCOUNTER — Other Ambulatory Visit: Payer: Self-pay | Admitting: *Deleted

## 2017-04-28 ENCOUNTER — Other Ambulatory Visit: Payer: Self-pay | Admitting: Oncology

## 2017-04-28 ENCOUNTER — Ambulatory Visit: Payer: BC Managed Care – PPO

## 2017-04-28 VITALS — BP 153/54 | HR 71 | Temp 98.3°F | Resp 16

## 2017-04-28 DIAGNOSIS — D631 Anemia in chronic kidney disease: Secondary | ICD-10-CM

## 2017-04-28 DIAGNOSIS — Z95828 Presence of other vascular implants and grafts: Secondary | ICD-10-CM

## 2017-04-28 DIAGNOSIS — Z5111 Encounter for antineoplastic chemotherapy: Secondary | ICD-10-CM

## 2017-04-28 DIAGNOSIS — D469 Myelodysplastic syndrome, unspecified: Secondary | ICD-10-CM

## 2017-04-28 DIAGNOSIS — C189 Malignant neoplasm of colon, unspecified: Secondary | ICD-10-CM

## 2017-04-28 DIAGNOSIS — N181 Chronic kidney disease, stage 1: Secondary | ICD-10-CM

## 2017-04-28 DIAGNOSIS — D500 Iron deficiency anemia secondary to blood loss (chronic): Secondary | ICD-10-CM

## 2017-04-28 LAB — COMPREHENSIVE METABOLIC PANEL
ALT: 45 U/L (ref 0–55)
AST: 65 U/L — ABNORMAL HIGH (ref 5–34)
Albumin: 3.8 g/dL (ref 3.5–5.0)
Alkaline Phosphatase: 146 U/L (ref 40–150)
Anion Gap: 13 mEq/L — ABNORMAL HIGH (ref 3–11)
BUN: 38.3 mg/dL — ABNORMAL HIGH (ref 7.0–26.0)
CO2: 24 mEq/L (ref 22–29)
Calcium: 9.4 mg/dL (ref 8.4–10.4)
Chloride: 99 mEq/L (ref 98–109)
Creatinine: 1.5 mg/dL — ABNORMAL HIGH (ref 0.6–1.1)
EGFR: 44 mL/min/{1.73_m2} — ABNORMAL LOW (ref >60)
Glucose: 208 mg/dl — ABNORMAL HIGH (ref 70–140)
Potassium: 4.2 mEq/L (ref 3.5–5.1)
Sodium: 135 mEq/L — ABNORMAL LOW (ref 136–145)
Total Bilirubin: 1.67 mg/dL — ABNORMAL HIGH (ref 0.20–1.20)
Total Protein: 7.6 g/dL (ref 6.4–8.3)

## 2017-04-28 LAB — CBC WITH DIFFERENTIAL/PLATELET
BASO%: 0.7 % (ref 0.0–2.0)
Basophils Absolute: 0 10*3/uL (ref 0.0–0.1)
EOS%: 1.5 % (ref 0.0–7.0)
Eosinophils Absolute: 0 10*3/uL (ref 0.0–0.5)
HCT: 22.1 % — ABNORMAL LOW (ref 34.8–46.6)
HGB: 7.4 g/dL — ABNORMAL LOW (ref 11.6–15.9)
LYMPH%: 22.8 % (ref 14.0–49.7)
MCH: 36.3 pg — ABNORMAL HIGH (ref 25.1–34.0)
MCHC: 33.5 g/dL (ref 31.5–36.0)
MCV: 108.3 fL — ABNORMAL HIGH (ref 79.5–101.0)
MONO#: 0.6 10*3/uL (ref 0.1–0.9)
MONO%: 21.3 % — ABNORMAL HIGH (ref 0.0–14.0)
NEUT#: 1.4 10*3/uL — ABNORMAL LOW (ref 1.5–6.5)
NEUT%: 53.7 % (ref 38.4–76.8)
Platelets: 190 10*3/uL (ref 145–400)
RBC: 2.04 10*6/uL — ABNORMAL LOW (ref 3.70–5.45)
RDW: 26.6 % — ABNORMAL HIGH (ref 11.2–14.5)
WBC: 2.7 10*3/uL — ABNORMAL LOW (ref 3.9–10.3)
lymph#: 0.6 10*3/uL — ABNORMAL LOW (ref 0.9–3.3)

## 2017-04-28 LAB — CHROMOSOME ANALYSIS, BONE MARROW

## 2017-04-28 LAB — TISSUE HYBRIDIZATION TO NCBH

## 2017-04-28 MED ORDER — SODIUM CHLORIDE 0.9% FLUSH
10.0000 mL | INTRAVENOUS | Status: DC | PRN
  Administered 2017-04-28: 10 mL via INTRAVENOUS
  Filled 2017-04-28: qty 10

## 2017-04-28 MED ORDER — PROCHLORPERAZINE MALEATE 10 MG PO TABS: 10.0000 mg | ORAL_TABLET | Freq: Four times a day (QID) | ORAL | 1 refills | Status: DC | PRN

## 2017-04-28 MED ORDER — ONDANSETRON HCL 8 MG PO TABS
ORAL_TABLET | ORAL | Status: AC
  Filled 2017-04-28: qty 1

## 2017-04-28 MED ORDER — AZACITIDINE CHEMO SQ INJECTION
76.0000 mg/m2 | Freq: Once | INTRAMUSCULAR | Status: AC
  Administered 2017-04-28: 175 mg via SUBCUTANEOUS
  Filled 2017-04-28: qty 7

## 2017-04-28 MED ORDER — ONDANSETRON HCL 8 MG PO TABS
8.0000 mg | ORAL_TABLET | Freq: Once | ORAL | Status: AC
  Administered 2017-04-28: 8 mg via ORAL

## 2017-04-28 NOTE — Telephone Encounter (Signed)
Called patient to ask if antimetic is helping with n/v? Patient states she has dry heaved but not vomited since taking the compazine. Encouraged her drink clear liquids, then try  sport drinks to replace electrolytes. Will check on patient tomorrow morning.

## 2017-04-28 NOTE — Progress Notes (Signed)
Port accessed with positive blood return. Not sufficient for lab draws. Port continued to flush with ease and no further blood return noted. Position changed and no results. Patient discharged to infusion room for further assessment. Charge nurse notified and advised to have patient report directly to infusion room.

## 2017-04-28 NOTE — Patient Instructions (Addendum)
[facility] Discharge Instructions for Patients Receiving Chemotherapy  Today you received the following chemotherapy agents Vidaza  To help prevent nausea and vomiting after your treatment, we encourage you to take your nausea medication as directed   If you develop nausea and vomiting that is not controlled by your nausea medication, call the clinic.   BELOW ARE SYMPTOMS THAT SHOULD BE REPORTED IMMEDIATELY:  *FEVER GREATER THAN 100.5 F  *CHILLS WITH OR WITHOUT FEVER  NAUSEA AND VOMITING THAT IS NOT CONTROLLED WITH YOUR NAUSEA MEDICATION  *UNUSUAL SHORTNESS OF BREATH  *UNUSUAL BRUISING OR BLEEDING  TENDERNESS IN MOUTH AND THROAT WITH OR WITHOUT PRESENCE OF ULCERS  *URINARY PROBLEMS  *BOWEL PROBLEMS  UNUSUAL RASH Items with * indicate a potential emergency and should be followed up as soon as possible.  Feel free to call the clinic should you have any questions or concerns. The clinic phone number is [phone].  Please show the CHEMO ALERT CARD at check-in to the Emergency Department and triage nurse.  Azacitidine suspension for injection (subcutaneous use) What is this medicine? AZACITIDINE (ay za SITE i deen) is a chemotherapy drug. This medicine reduces the growth of cancer cells and can suppress the immune system. It is used for treating myelodysplastic syndrome or some types of leukemia. This medicine may be used for other purposes; ask your health care provider or pharmacist if you have questions. COMMON BRAND NAME(S): Vidaza What should I tell my health care provider before I take this medicine? They need to know if you have any of these conditions: -kidney disease -liver disease -liver tumors -an unusual or allergic reaction to azacitidine, mannitol, other medicines, foods, dyes, or preservatives -pregnant or trying to get pregnant -breast-feeding How should I use this medicine? This medicine is for injection under the skin. It is  administered in a hospital or clinic by a specially trained health care professional. Talk to your pediatrician regarding the use of this medicine in children. While this drug may be prescribed for selected conditions, precautions do apply. Overdosage: If you think you have taken too much of this medicine contact a poison control center or emergency room at once. NOTE: This medicine is only for you. Do not share this medicine with others. What if I miss a dose? It is important not to miss your dose. Call your doctor or health care professional if you are unable to keep an appointment. What may interact with this medicine? Interactions have not been studied. Give your health care provider a list of all the medicines, herbs, non-prescription drugs, or dietary supplements you use. Also tell them if you smoke, drink alcohol, or use illegal drugs. Some items may interact with your medicine. This list may not describe all possible interactions. Give your health care provider a list of all the medicines, herbs, non-prescription drugs, or dietary supplements you use. Also tell them if you smoke, drink alcohol, or use illegal drugs. Some items may interact with your medicine. What should I watch for while using this medicine? Visit your doctor for checks on your progress. This drug may make you feel generally unwell. This is not uncommon, as chemotherapy can affect healthy cells as well as cancer cells. Report any side effects. Continue your course of treatment even though you feel ill unless your doctor tells you to stop. In some cases, you may be given additional medicines to help with side effects. Follow all directions for their use. Call your doctor or health care professional for   advice if you get a fever, chills or sore throat, or other symptoms of a cold or flu. Do not treat yourself. This drug decreases your body's ability to fight infections. Try to avoid being around people who are sick. This  medicine may increase your risk to bruise or bleed. Call your doctor or health care professional if you notice any unusual bleeding. You may need blood work done while you are taking this medicine. Do not become pregnant while taking this medicine and for 6 months after the last dose. Women should inform their doctor if they wish to become pregnant or think they might be pregnant. Men should not father a child while taking this medicine and for 3 months after the last dose. There is a potential for serious side effects to an unborn child. Talk to your health care professional or pharmacist for more information. Do not breast-feed an infant while taking this medicine and for 1 week after the last dose. This medicine may interfere with the ability to have a child. Talk with your doctor or health care professional if you are concerned about your fertility. What side effects may I notice from receiving this medicine? Side effects that you should report to your doctor or health care professional as soon as possible: -allergic reactions like skin rash, itching or hives, swelling of the face, lips, or tongue -low blood counts - this medicine may decrease the number of white blood cells, red blood cells and platelets. You may be at increased risk for infections and bleeding. -signs of infection - fever or chills, cough, sore throat, pain passing urine -signs of decreased platelets or bleeding - bruising, pinpoint red spots on the skin, black, tarry stools, blood in the urine -signs of decreased red blood cells - unusually weak or tired, fainting spells, lightheadedness -signs and symptoms of kidney injury like trouble passing urine or change in the amount of urine -signs and symptoms of liver injury like dark yellow or brown urine; general ill feeling or flu-like symptoms; light-colored stools; loss of appetite; nausea; right upper belly pain; unusually weak or tired; yellowing of the eyes or skin Side effects  that usually do not require medical attention (report to your doctor or health care professional if they continue or are bothersome): -constipation -diarrhea -nausea, vomiting -pain or redness at the injection site -unusually weak or tired This list may not describe all possible side effects. Call your doctor for medical advice about side effects. You may report side effects to FDA at 1-800-FDA-1088. Where should I keep my medicine? This drug is given in a hospital or clinic and will not be stored at home. NOTE: This sheet is a summary. It may not cover all possible information. If you have questions about this medicine, talk to your doctor, pharmacist, or health care provider.  2018 Elsevier/Gold Standard (2016-07-30 14:37:51)   

## 2017-04-28 NOTE — Progress Notes (Signed)
Dr. Alen Blew aware of abnormal labs. Advised ok to treat.

## 2017-04-28 NOTE — Telephone Encounter (Signed)
Patient C/O N/V X4 after 1st chemo tx today.  Compazine 10mg  called to patients' pharmacy

## 2017-04-29 ENCOUNTER — Other Ambulatory Visit: Payer: Self-pay | Admitting: *Deleted

## 2017-04-29 ENCOUNTER — Ambulatory Visit (HOSPITAL_BASED_OUTPATIENT_CLINIC_OR_DEPARTMENT_OTHER): Payer: BC Managed Care – PPO

## 2017-04-29 ENCOUNTER — Telehealth: Payer: Self-pay | Admitting: *Deleted

## 2017-04-29 VITALS — BP 143/48 | HR 77 | Temp 98.3°F | Resp 18

## 2017-04-29 DIAGNOSIS — Z5111 Encounter for antineoplastic chemotherapy: Secondary | ICD-10-CM | POA: Diagnosis not present

## 2017-04-29 DIAGNOSIS — D469 Myelodysplastic syndrome, unspecified: Secondary | ICD-10-CM | POA: Diagnosis not present

## 2017-04-29 MED ORDER — AZACITIDINE CHEMO SQ INJECTION
76.0000 mg/m2 | Freq: Once | INTRAMUSCULAR | Status: AC
  Administered 2017-04-29: 175 mg via SUBCUTANEOUS
  Filled 2017-04-29: qty 7

## 2017-04-29 MED ORDER — ONDANSETRON HCL 8 MG PO TABS
8.0000 mg | ORAL_TABLET | Freq: Once | ORAL | Status: AC
  Administered 2017-04-29: 8 mg via ORAL

## 2017-04-29 MED ORDER — ONDANSETRON HCL 8 MG PO TABS
ORAL_TABLET | ORAL | Status: AC
  Filled 2017-04-29: qty 1

## 2017-04-29 NOTE — Patient Instructions (Signed)
[facility] Discharge Instructions for Patients Receiving Chemotherapy  Today you received the following chemotherapy agents Vidaza  To help prevent nausea and vomiting after your treatment, we encourage you to take your nausea medication as directed   If you develop nausea and vomiting that is not controlled by your nausea medication, call the clinic.   BELOW ARE SYMPTOMS THAT SHOULD BE REPORTED IMMEDIATELY:  *FEVER GREATER THAN 100.5 F  *CHILLS WITH OR WITHOUT FEVER  NAUSEA AND VOMITING THAT IS NOT CONTROLLED WITH YOUR NAUSEA MEDICATION  *UNUSUAL SHORTNESS OF BREATH  *UNUSUAL BRUISING OR BLEEDING  TENDERNESS IN MOUTH AND THROAT WITH OR WITHOUT PRESENCE OF ULCERS  *URINARY PROBLEMS  *BOWEL PROBLEMS  UNUSUAL RASH Items with * indicate a potential emergency and should be followed up as soon as possible.  Feel free to call the clinic should you have any questions or concerns. The clinic phone number is [phone].  Please show the CHEMO ALERT CARD at check-in to the Emergency Department and triage nurse.  Azacitidine suspension for injection (subcutaneous use) What is this medicine? AZACITIDINE (ay za SITE i deen) is a chemotherapy drug. This medicine reduces the growth of cancer cells and can suppress the immune system. It is used for treating myelodysplastic syndrome or some types of leukemia. This medicine may be used for other purposes; ask your health care provider or pharmacist if you have questions. COMMON BRAND NAME(S): Vidaza What should I tell my health care provider before I take this medicine? They need to know if you have any of these conditions: -kidney disease -liver disease -liver tumors -an unusual or allergic reaction to azacitidine, mannitol, other medicines, foods, dyes, or preservatives -pregnant or trying to get pregnant -breast-feeding How should I use this medicine? This medicine is for injection under the skin. It is  administered in a hospital or clinic by a specially trained health care professional. Talk to your pediatrician regarding the use of this medicine in children. While this drug may be prescribed for selected conditions, precautions do apply. Overdosage: If you think you have taken too much of this medicine contact a poison control center or emergency room at once. NOTE: This medicine is only for you. Do not share this medicine with others. What if I miss a dose? It is important not to miss your dose. Call your doctor or health care professional if you are unable to keep an appointment. What may interact with this medicine? Interactions have not been studied. Give your health care provider a list of all the medicines, herbs, non-prescription drugs, or dietary supplements you use. Also tell them if you smoke, drink alcohol, or use illegal drugs. Some items may interact with your medicine. This list may not describe all possible interactions. Give your health care provider a list of all the medicines, herbs, non-prescription drugs, or dietary supplements you use. Also tell them if you smoke, drink alcohol, or use illegal drugs. Some items may interact with your medicine. What should I watch for while using this medicine? Visit your doctor for checks on your progress. This drug may make you feel generally unwell. This is not uncommon, as chemotherapy can affect healthy cells as well as cancer cells. Report any side effects. Continue your course of treatment even though you feel ill unless your doctor tells you to stop. In some cases, you may be given additional medicines to help with side effects. Follow all directions for their use. Call your doctor or health care professional for   advice if you get a fever, chills or sore throat, or other symptoms of a cold or flu. Do not treat yourself. This drug decreases your body's ability to fight infections. Try to avoid being around people who are sick. This  medicine may increase your risk to bruise or bleed. Call your doctor or health care professional if you notice any unusual bleeding. You may need blood work done while you are taking this medicine. Do not become pregnant while taking this medicine and for 6 months after the last dose. Women should inform their doctor if they wish to become pregnant or think they might be pregnant. Men should not father a child while taking this medicine and for 3 months after the last dose. There is a potential for serious side effects to an unborn child. Talk to your health care professional or pharmacist for more information. Do not breast-feed an infant while taking this medicine and for 1 week after the last dose. This medicine may interfere with the ability to have a child. Talk with your doctor or health care professional if you are concerned about your fertility. What side effects may I notice from receiving this medicine? Side effects that you should report to your doctor or health care professional as soon as possible: -allergic reactions like skin rash, itching or hives, swelling of the face, lips, or tongue -low blood counts - this medicine may decrease the number of white blood cells, red blood cells and platelets. You may be at increased risk for infections and bleeding. -signs of infection - fever or chills, cough, sore throat, pain passing urine -signs of decreased platelets or bleeding - bruising, pinpoint red spots on the skin, black, tarry stools, blood in the urine -signs of decreased red blood cells - unusually weak or tired, fainting spells, lightheadedness -signs and symptoms of kidney injury like trouble passing urine or change in the amount of urine -signs and symptoms of liver injury like dark yellow or brown urine; general ill feeling or flu-like symptoms; light-colored stools; loss of appetite; nausea; right upper belly pain; unusually weak or tired; yellowing of the eyes or skin Side effects  that usually do not require medical attention (report to your doctor or health care professional if they continue or are bothersome): -constipation -diarrhea -nausea, vomiting -pain or redness at the injection site -unusually weak or tired This list may not describe all possible side effects. Call your doctor for medical advice about side effects. You may report side effects to FDA at 1-800-FDA-1088. Where should I keep my medicine? This drug is given in a hospital or clinic and will not be stored at home. NOTE: This sheet is a summary. It may not cover all possible information. If you have questions about this medicine, talk to your doctor, pharmacist, or health care provider.  2018 Elsevier/Gold Standard (2016-07-30 14:37:51)   

## 2017-04-29 NOTE — Telephone Encounter (Signed)
Spoke with patient.  She stated that she was feeling fine. No N/V/diarrhea. Has been drinking lot of fluids.  She was here at the [facility] waiting for her next Tx.  She knew to call us if she had any issues.

## 2017-04-30 ENCOUNTER — Other Ambulatory Visit: Payer: Self-pay | Admitting: *Deleted

## 2017-04-30 ENCOUNTER — Encounter (HOSPITAL_COMMUNITY): Payer: Self-pay

## 2017-04-30 ENCOUNTER — Other Ambulatory Visit: Payer: Self-pay | Admitting: Oncology

## 2017-04-30 ENCOUNTER — Telehealth: Payer: Self-pay

## 2017-04-30 ENCOUNTER — Telehealth: Payer: Self-pay | Admitting: *Deleted

## 2017-04-30 ENCOUNTER — Ambulatory Visit (HOSPITAL_BASED_OUTPATIENT_CLINIC_OR_DEPARTMENT_OTHER): Payer: BC Managed Care – PPO

## 2017-04-30 VITALS — BP 166/46 | HR 85 | Temp 98.6°F | Resp 18

## 2017-04-30 DIAGNOSIS — D469 Myelodysplastic syndrome, unspecified: Secondary | ICD-10-CM | POA: Diagnosis not present

## 2017-04-30 DIAGNOSIS — Z5111 Encounter for antineoplastic chemotherapy: Secondary | ICD-10-CM

## 2017-04-30 MED ORDER — AZACITIDINE CHEMO SQ INJECTION
76.0000 mg/m2 | Freq: Once | INTRAMUSCULAR | Status: AC
  Administered 2017-04-30: 175 mg via SUBCUTANEOUS
  Filled 2017-04-30: qty 7

## 2017-04-30 MED ORDER — ONDANSETRON HCL 8 MG PO TABS
8.0000 mg | ORAL_TABLET | Freq: Once | ORAL | Status: AC
  Administered 2017-04-30: 8 mg via ORAL

## 2017-04-30 MED ORDER — ONDANSETRON HCL 8 MG PO TABS
ORAL_TABLET | ORAL | Status: AC
  Filled 2017-04-30: qty 1

## 2017-04-30 NOTE — Telephone Encounter (Signed)
Spoke with patient, lab and blood transfusion cancelled for tomorrow, will only have chemotherapy at 9:00 am

## 2017-04-30 NOTE — Patient Instructions (Signed)
[facility] Discharge Instructions for Patients Receiving Chemotherapy  Today you received the following chemotherapy agents Vidaza  To help prevent nausea and vomiting after your treatment, we encourage you to take your nausea medication as directed  If you develop nausea and vomiting that is not controlled by your nausea medication, call the clinic.   BELOW ARE SYMPTOMS THAT SHOULD BE REPORTED IMMEDIATELY:  *FEVER GREATER THAN 100.5 F  *CHILLS WITH OR WITHOUT FEVER  NAUSEA AND VOMITING THAT IS NOT CONTROLLED WITH YOUR NAUSEA MEDICATION  *UNUSUAL SHORTNESS OF BREATH  *UNUSUAL BRUISING OR BLEEDING  TENDERNESS IN MOUTH AND THROAT WITH OR WITHOUT PRESENCE OF ULCERS  *URINARY PROBLEMS  *BOWEL PROBLEMS  UNUSUAL RASH Items with * indicate a potential emergency and should be followed up as soon as possible.  Feel free to call the clinic should you have any questions or concerns. The clinic phone number is [phone].  Please show the CHEMO ALERT CARD at check-in to the Emergency Department and triage nurse.   

## 2017-04-30 NOTE — Telephone Encounter (Signed)
Change patient schedule due to MD. Requested per 10/17 verbal (RN. Di) los.

## 2017-05-01 ENCOUNTER — Other Ambulatory Visit: Payer: BC Managed Care – PPO

## 2017-05-01 ENCOUNTER — Ambulatory Visit: Payer: BC Managed Care – PPO

## 2017-05-01 ENCOUNTER — Ambulatory Visit (HOSPITAL_BASED_OUTPATIENT_CLINIC_OR_DEPARTMENT_OTHER): Payer: BC Managed Care – PPO

## 2017-05-01 ENCOUNTER — Telehealth: Payer: Self-pay | Admitting: Oncology

## 2017-05-01 VITALS — BP 193/73 | HR 84 | Temp 98.4°F | Resp 18

## 2017-05-01 DIAGNOSIS — Z5111 Encounter for antineoplastic chemotherapy: Secondary | ICD-10-CM | POA: Diagnosis not present

## 2017-05-01 DIAGNOSIS — D469 Myelodysplastic syndrome, unspecified: Secondary | ICD-10-CM

## 2017-05-01 MED ORDER — ONDANSETRON HCL 8 MG PO TABS
8.0000 mg | ORAL_TABLET | Freq: Once | ORAL | Status: AC
  Administered 2017-05-01: 8 mg via ORAL

## 2017-05-01 MED ORDER — ONDANSETRON HCL 8 MG PO TABS
ORAL_TABLET | ORAL | Status: AC
  Filled 2017-05-01: qty 1

## 2017-05-01 MED ORDER — AZACITIDINE CHEMO SQ INJECTION
76.0000 mg/m2 | Freq: Once | INTRAMUSCULAR | Status: AC
  Administered 2017-05-01: 175 mg via SUBCUTANEOUS
  Filled 2017-05-01: qty 7

## 2017-05-01 NOTE — Telephone Encounter (Signed)
Scheduled appt per 10/17 sch msg. Patient will get updated schedule in infusion.

## 2017-05-01 NOTE — Patient Instructions (Signed)
[facility] Discharge Instructions for Patients Receiving Chemotherapy  Today you received the following chemotherapy agents Vidaza  To help prevent nausea and vomiting after your treatment, we encourage you to take your nausea medication as directed  If you develop nausea and vomiting that is not controlled by your nausea medication, call the clinic.   BELOW ARE SYMPTOMS THAT SHOULD BE REPORTED IMMEDIATELY:  *FEVER GREATER THAN 100.5 F  *CHILLS WITH OR WITHOUT FEVER  NAUSEA AND VOMITING THAT IS NOT CONTROLLED WITH YOUR NAUSEA MEDICATION  *UNUSUAL SHORTNESS OF BREATH  *UNUSUAL BRUISING OR BLEEDING  TENDERNESS IN MOUTH AND THROAT WITH OR WITHOUT PRESENCE OF ULCERS  *URINARY PROBLEMS  *BOWEL PROBLEMS  UNUSUAL RASH Items with * indicate a potential emergency and should be followed up as soon as possible.  Feel free to call the clinic should you have any questions or concerns. The clinic phone number is [phone].  Please show the CHEMO ALERT CARD at check-in to the Emergency Department and triage nurse.   

## 2017-05-01 NOTE — Progress Notes (Signed)
Per Dr. Alen Blew okay to treat pt with Vidaza with abnormal labs.

## 2017-05-02 ENCOUNTER — Ambulatory Visit (HOSPITAL_BASED_OUTPATIENT_CLINIC_OR_DEPARTMENT_OTHER): Payer: BC Managed Care – PPO

## 2017-05-02 VITALS — BP 164/60 | HR 82 | Temp 97.9°F | Resp 20

## 2017-05-02 DIAGNOSIS — D469 Myelodysplastic syndrome, unspecified: Secondary | ICD-10-CM | POA: Diagnosis not present

## 2017-05-02 DIAGNOSIS — Z5111 Encounter for antineoplastic chemotherapy: Secondary | ICD-10-CM | POA: Diagnosis not present

## 2017-05-02 MED ORDER — AZACITIDINE CHEMO SQ INJECTION
76.5000 mg/m2 | Freq: Once | INTRAMUSCULAR | Status: AC
  Administered 2017-05-02: 175 mg via SUBCUTANEOUS
  Filled 2017-05-02: qty 7

## 2017-05-02 MED ORDER — ONDANSETRON HCL 8 MG PO TABS
8.0000 mg | ORAL_TABLET | Freq: Once | ORAL | Status: AC
  Administered 2017-05-02: 8 mg via ORAL

## 2017-05-02 MED ORDER — ONDANSETRON HCL 8 MG PO TABS
ORAL_TABLET | ORAL | Status: AC
  Filled 2017-05-02: qty 1

## 2017-05-02 NOTE — Patient Instructions (Signed)
[facility] Discharge Instructions for Patients Receiving Chemotherapy  Today you received the following chemotherapy agents Vidaza  To help prevent nausea and vomiting after your treatment, we encourage you to take your nausea medication as directed  If you develop nausea and vomiting that is not controlled by your nausea medication, call the clinic.   BELOW ARE SYMPTOMS THAT SHOULD BE REPORTED IMMEDIATELY:  *FEVER GREATER THAN 100.5 F  *CHILLS WITH OR WITHOUT FEVER  NAUSEA AND VOMITING THAT IS NOT CONTROLLED WITH YOUR NAUSEA MEDICATION  *UNUSUAL SHORTNESS OF BREATH  *UNUSUAL BRUISING OR BLEEDING  TENDERNESS IN MOUTH AND THROAT WITH OR WITHOUT PRESENCE OF ULCERS  *URINARY PROBLEMS  *BOWEL PROBLEMS  UNUSUAL RASH Items with * indicate a potential emergency and should be followed up as soon as possible.  Feel free to call the clinic should you have any questions or concerns. The clinic phone number is [phone].  Please show the CHEMO ALERT CARD at check-in to the Emergency Department and triage nurse.   

## 2017-05-08 ENCOUNTER — Encounter: Payer: Self-pay | Admitting: *Deleted

## 2017-05-08 ENCOUNTER — Ambulatory Visit (HOSPITAL_COMMUNITY)
Admission: RE | Admit: 2017-05-08 | Discharge: 2017-05-08 | Disposition: A | Discharge status: Home / Self Care | Payer: BC Managed Care – PPO | Source: Ambulatory Visit | Attending: Oncology | Admitting: Oncology

## 2017-05-08 DIAGNOSIS — C189 Malignant neoplasm of colon, unspecified: Secondary | ICD-10-CM | POA: Insufficient documentation

## 2017-05-08 DIAGNOSIS — I878 Other specified disorders of veins: Secondary | ICD-10-CM | POA: Insufficient documentation

## 2017-05-08 DIAGNOSIS — R601 Generalized edema: Secondary | ICD-10-CM | POA: Diagnosis not present

## 2017-05-08 DIAGNOSIS — R188 Other ascites: Secondary | ICD-10-CM | POA: Insufficient documentation

## 2017-05-08 MED ORDER — IOPAMIDOL (ISOVUE-300) INJECTION 61%
30.0000 mL | Freq: Once | INTRAVENOUS | Status: AC | PRN
  Administered 2017-05-08: 30 mL via ORAL

## 2017-05-08 MED ORDER — IOPAMIDOL (ISOVUE-300) INJECTION 61%
INTRAVENOUS | Status: AC
  Administered 2017-05-08: 30 mL via ORAL
  Filled 2017-05-08: qty 30

## 2017-05-09 ENCOUNTER — Other Ambulatory Visit: Payer: Self-pay | Admitting: *Deleted

## 2017-05-09 ENCOUNTER — Other Ambulatory Visit (HOSPITAL_BASED_OUTPATIENT_CLINIC_OR_DEPARTMENT_OTHER): Payer: BC Managed Care – PPO

## 2017-05-09 ENCOUNTER — Ambulatory Visit (HOSPITAL_BASED_OUTPATIENT_CLINIC_OR_DEPARTMENT_OTHER): Payer: BC Managed Care – PPO

## 2017-05-09 ENCOUNTER — Ambulatory Visit (HOSPITAL_COMMUNITY)
Admission: RE | Admit: 2017-05-09 | Discharge: 2017-05-09 | Disposition: A | Discharge status: Home / Self Care | Payer: BC Managed Care – PPO | Source: Ambulatory Visit | Attending: Oncology | Admitting: Oncology

## 2017-05-09 VITALS — BP 162/73 | HR 76 | Temp 98.4°F | Resp 16

## 2017-05-09 DIAGNOSIS — D500 Iron deficiency anemia secondary to blood loss (chronic): Secondary | ICD-10-CM

## 2017-05-09 DIAGNOSIS — D649 Anemia, unspecified: Secondary | ICD-10-CM

## 2017-05-09 DIAGNOSIS — D469 Myelodysplastic syndrome, unspecified: Secondary | ICD-10-CM

## 2017-05-09 DIAGNOSIS — Z23 Encounter for immunization: Secondary | ICD-10-CM

## 2017-05-09 LAB — COMPREHENSIVE METABOLIC PANEL
ALT: 37 U/L (ref 0–55)
AST: 38 U/L — ABNORMAL HIGH (ref 5–34)
Albumin: 3.2 g/dL — ABNORMAL LOW (ref 3.5–5.0)
Alkaline Phosphatase: 130 U/L (ref 40–150)
Anion Gap: 13 mEq/L — ABNORMAL HIGH (ref 3–11)
BUN: 39.2 mg/dL — ABNORMAL HIGH (ref 7.0–26.0)
CO2: 23 mEq/L (ref 22–29)
Calcium: 9 mg/dL (ref 8.4–10.4)
Chloride: 97 mEq/L — ABNORMAL LOW (ref 98–109)
Creatinine: 1.7 mg/dL — ABNORMAL HIGH (ref 0.6–1.1)
EGFR: 37 mL/min/{1.73_m2} — ABNORMAL LOW (ref >60)
Glucose: 341 mg/dl — ABNORMAL HIGH (ref 70–140)
Potassium: 3.4 mEq/L — ABNORMAL LOW (ref 3.5–5.1)
Sodium: 133 mEq/L — ABNORMAL LOW (ref 136–145)
Total Bilirubin: 2.22 mg/dL — ABNORMAL HIGH (ref 0.20–1.20)
Total Protein: 6.6 g/dL (ref 6.4–8.3)

## 2017-05-09 LAB — CBC WITH DIFFERENTIAL/PLATELET
BASO%: 0.4 % (ref 0.0–2.0)
Basophils Absolute: 0 10*3/uL (ref 0.0–0.1)
EOS%: 0.4 % (ref 0.0–7.0)
Eosinophils Absolute: 0 10*3/uL (ref 0.0–0.5)
HCT: 15.7 % — ABNORMAL LOW (ref 34.8–46.6)
HGB: 5.4 g/dL — CL (ref 11.6–15.9)
LYMPH%: 9.2 % — ABNORMAL LOW (ref 14.0–49.7)
MCH: 37 pg — ABNORMAL HIGH (ref 25.1–34.0)
MCHC: 34.4 g/dL (ref 31.5–36.0)
MCV: 107.5 fL — ABNORMAL HIGH (ref 79.5–101.0)
MONO#: 0.4 10*3/uL (ref 0.1–0.9)
MONO%: 9.2 % (ref 0.0–14.0)
NEUT#: 3.6 10*3/uL (ref 1.5–6.5)
NEUT%: 80.8 % — ABNORMAL HIGH (ref 38.4–76.8)
Platelets: 148 10*3/uL (ref 145–400)
RBC: 1.46 10*6/uL — ABNORMAL LOW (ref 3.70–5.45)
WBC: 4.5 10*3/uL (ref 3.9–10.3)
lymph#: 0.4 10*3/uL — ABNORMAL LOW (ref 0.9–3.3)
nRBC: 2 % — ABNORMAL HIGH (ref 0–0)

## 2017-05-09 LAB — PREPARE RBC (CROSSMATCH)

## 2017-05-09 MED ORDER — ACETAMINOPHEN 325 MG PO TABS
ORAL_TABLET | ORAL | Status: AC
  Filled 2017-05-09: qty 2

## 2017-05-09 MED ORDER — HEPARIN SOD (PORK) LOCK FLUSH 100 UNIT/ML IV SOLN
500.0000 [IU] | Freq: Every day | INTRAVENOUS | Status: AC | PRN
  Administered 2017-05-09: 500 [IU]
  Filled 2017-05-09: qty 5

## 2017-05-09 MED ORDER — SODIUM CHLORIDE 0.9% FLUSH
10.0000 mL | INTRAVENOUS | Status: AC | PRN
  Administered 2017-05-09: 10 mL
  Filled 2017-05-09: qty 10

## 2017-05-09 MED ORDER — SODIUM CHLORIDE 0.9 % IV SOLN
250.0000 mL | Freq: Once | INTRAVENOUS | Status: AC
  Administered 2017-05-09: 250 mL via INTRAVENOUS

## 2017-05-09 MED ORDER — DIPHENHYDRAMINE HCL 25 MG PO CAPS
25.0000 mg | ORAL_CAPSULE | Freq: Once | ORAL | Status: AC
  Administered 2017-05-09: 25 mg via ORAL

## 2017-05-09 MED ORDER — INFLUENZA VAC SPLIT QUAD 0.5 ML IM SUSY
0.5000 mL | PREFILLED_SYRINGE | Freq: Once | INTRAMUSCULAR | Status: AC
  Administered 2017-05-09: 0.5 mL via INTRAMUSCULAR
  Filled 2017-05-09: qty 0.5

## 2017-05-09 MED ORDER — FUROSEMIDE 10 MG/ML IJ SOLN
20.0000 mg | Freq: Once | INTRAMUSCULAR | Status: AC
  Administered 2017-05-09: 20 mg via INTRAVENOUS

## 2017-05-09 MED ORDER — FUROSEMIDE 10 MG/ML IJ SOLN
INTRAMUSCULAR | Status: AC
  Filled 2017-05-09: qty 2

## 2017-05-09 MED ORDER — DIPHENHYDRAMINE HCL 25 MG PO CAPS
ORAL_CAPSULE | ORAL | Status: AC
  Filled 2017-05-09: qty 1

## 2017-05-09 MED ORDER — ACETAMINOPHEN 325 MG PO TABS
650.0000 mg | ORAL_TABLET | Freq: Once | ORAL | Status: AC
  Administered 2017-05-09: 650 mg via ORAL

## 2017-05-09 NOTE — Patient Instructions (Signed)

## 2017-05-10 LAB — BPAM RBC
Blood Product Expiration Date: 201811122359
Blood Product Expiration Date: 201811122359
ISSUE DATE / TIME: 201810261211
ISSUE DATE / TIME: 201810261211
Unit Type and Rh: 600
Unit Type and Rh: 600

## 2017-05-10 LAB — TYPE AND SCREEN
ABO/RH(D): A NEG
Antibody Screen: NEGATIVE
Unit division: 0
Unit division: 0

## 2017-05-12 ENCOUNTER — Telehealth: Payer: Self-pay | Admitting: Oncology

## 2017-05-12 NOTE — Telephone Encounter (Signed)
Faxed completed FMLA forms to [facility] per clinician to [phone] on 05/12/2017

## 2017-05-13 ENCOUNTER — Encounter: Payer: Self-pay | Admitting: Cardiovascular Disease

## 2017-05-13 ENCOUNTER — Ambulatory Visit (INDEPENDENT_AMBULATORY_CARE_PROVIDER_SITE_OTHER): Payer: BC Managed Care – PPO | Admitting: Cardiovascular Disease

## 2017-05-13 VITALS — BP 158/60 | HR 77 | Ht 64.0 in | Wt 253.0 lb

## 2017-05-13 DIAGNOSIS — E785 Hyperlipidemia, unspecified: Secondary | ICD-10-CM | POA: Insufficient documentation

## 2017-05-13 DIAGNOSIS — I10 Essential (primary) hypertension: Secondary | ICD-10-CM

## 2017-05-13 DIAGNOSIS — I509 Heart failure, unspecified: Secondary | ICD-10-CM | POA: Diagnosis not present

## 2017-05-13 DIAGNOSIS — E7800 Pure hypercholesterolemia, unspecified: Secondary | ICD-10-CM

## 2017-05-13 DIAGNOSIS — R0989 Other specified symptoms and signs involving the circulatory and respiratory systems: Secondary | ICD-10-CM | POA: Diagnosis not present

## 2017-05-13 DIAGNOSIS — R600 Localized edema: Secondary | ICD-10-CM | POA: Diagnosis not present

## 2017-05-13 DIAGNOSIS — I38 Endocarditis, valve unspecified: Secondary | ICD-10-CM

## 2017-05-13 NOTE — Assessment & Plan Note (Signed)
History of hyperlipidemia on statin therapy followed by her PCP. 

## 2017-05-13 NOTE — Progress Notes (Signed)
05/13/2017 Patricia Mata   01/22/1956  [id number]  Primary Physician Patricia Chard, MD Primary Cardiologist: Patricia Harp MD Patricia Mata, [place]  HPI:  Patricia Mata is a 61 y.o. female married African-American female mother of one child, grandmother of 2 children referred by Dr. Baird Mata for cardiac evaluation because of lower extremity edema. I apparently saw her 20 years ago which time I did a heart catheterization on her revealing normal coronary arteries and normal LV function. She was referred for evaluation of bilateral lower extremity edema. She does have a history of hypertension, hyperlipidemia and diabetes. She stopped smoking 20 years ago. She's never had a heart attack or stroke. Her father did have a myocardial infarction in his late 67s and died from that. 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 She remains Mata free. She's recently had iron deficiency anemia and has undergone multiple transfusions. She's noticed increasing lower extremity edema over the last several weeks to months. She also drinks 2 mixed drinks a day.   Current Meds  Medication Sig  . atorvastatin (LIPITOR) 40 MG tablet Take 40 mg by mouth daily.  . Azilsartan-Chlorthalidone (EDARBYCLOR) 40-12.5 MG TABS Take 1 tablet by mouth daily with lunch.  . colchicine 0.6 MG tablet Take 0.6 mg by mouth daily with lunch.   . furosemide (LASIX) 20 MG tablet Take 1 tablet (20 mg total) by mouth 2 (two) times daily.  Marland Kitchen glimepiride (AMARYL) 2 MG tablet Take 2 mg by mouth daily.   . hydrALAZINE (APRESOLINE) 25 MG tablet Take 50 mg by mouth 2 (two) times daily.  Marland Kitchen JANUMET XR 50-500 MG TB24 Take 1 tablet by mouth daily.  Marland Kitchen latanoprost (XALATAN) 0.005 % ophthalmic solution Place 1 drop into both eyes at bedtime.   . lidocaine-prilocaine (EMLA) cream Apply a quarter size of cream to port 1-2 hours prior to access. Cover with saran wrap.  . metoprolol (LOPRESSOR) 50 MG tablet Take 50  mg by mouth 2 (two) times daily.   . prochlorperazine (COMPAZINE) 10 MG tablet Take 1 tablet (10 mg total) by mouth every 6 (six) hours as needed for nausea or vomiting.  . sitaGLIPtin-metformin (JANUMET) 50-1000 MG tablet Take 1 tablet by mouth 2 (two) times daily.   . [DISCONTINUED] furosemide (LASIX) 20 MG tablet TAKE 1 TABLET BY MOUTH EVERY DAY     Allergies  Allergen Reactions  . Ancef [Cefazolin] Itching    Severe itching- after procedure, ancef was the antibiotic.-04/02/17  . Sulfa Antibiotics Rash and Other (See Comments)    blisters    Social History   Social History  . Marital status: Married    Spouse name: N/A  . Number of children: N/A  . Years of education: N/A   Occupational History  . Not on file.   Social History Main Topics  . Smoking status: Former Smoker    Types: Cigarettes    Quit date: 01/31/2001  . Smokeless tobacco: Former Systems developer  . Alcohol use 1.2 oz/week    2 Shots of liquor per week  . Drug use: Unknown  . Sexual activity: Not on file   Other Topics Concern  . Not on file   Social History Narrative  . No narrative on file     Review of Systems: General: negative for chills, fever, night sweats or weight changes.  Cardiovascular: negative for chest pain, dyspnea on exertion, edema, orthopnea, palpitations, paroxysmal nocturnal dyspnea or shortness of breath Dermatological:  negative for rash Respiratory: negative for cough or wheezing Urologic: negative for hematuria Abdominal: negative for nausea, vomiting, diarrhea, bright red blood per rectum, melena, or hematemesis Neurologic: negative for visual changes, syncope, or dizziness All other systems reviewed and are otherwise negative except as noted above.    Blood pressure (!) 158/60, pulse 77, height 5\' 4"  (1.626 m), weight 253 lb (114.8 kg).  General appearance: alert and no distress Neck: no adenopathy, no JVD, supple, symmetrical, trachea midline, thyroid not enlarged, symmetric, no  tenderness/mass/nodules and Soft bilateral carotid bruits Lungs: clear to auscultation bilaterally Heart: regular rate and rhythm, S1, S2 normal, no murmur, click, rub or gallop Extremities: 1-2+ pitting edema bilaterally Pulses: 2+ and symmetric Skin: Skin color, texture, turgor normal. No rashes or lesions Neurologic: Alert and oriented X 3, normal strength and tone. Normal symmetric reflexes. Normal coordination and gait  EKG sinus rhythm at 62 with lateral T-wave inversion. I personally reviewed this EKG.  ASSESSMENT AND PLAN:   Bilateral lower extremity edema Patricia Mata was referred to me by Dr. Baird Mata for evaluation of bilateral lower extremity edema. I can lead to heart cath on her 20 years ago which was essentially normal. She has had chemotherapy for colon Mata 10 years ago and has required multiple blood transfusions recently for anemia. She's noticed weight gain or lower extremity edema over the last several months. She is on low-dose furosemide. She has 1-2+ pitting edema with elevated neck veins. I am going to get a 2-D echo to further evaluate.  Essential hypertension History of essential hypertension blood pressure measured 158/60. She is on Edarbyclor as well as hydralazine and metoprolol. Continue current meds at current dosing  Hyperlipidemia History of hyperlipidemia on statin therapy followed by her PCP      Patricia Harp MD [facility] 05/13/2017 12:23 PM

## 2017-05-13 NOTE — Assessment & Plan Note (Signed)
History of essential hypertension blood pressure measured 158/60. She is on Edarbyclor as well as hydralazine and metoprolol. Continue current meds at current dosing

## 2017-05-13 NOTE — Patient Instructions (Signed)
Medication Instructions: Your physician recommends that you continue on your current medications as directed. Please refer to the Current Medication list given to you today.   Testing/Procedures: Your physician has requested that you have a carotid duplex. This test is an ultrasound of the carotid arteries in your neck. It looks at blood flow through these arteries that supply the brain with blood. Allow one hour for this exam. There are no restrictions or special instructions.  Your physician has requested that you have an echocardiogram. Echocardiography is a painless test that uses sound waves to create images of your heart. It provides your doctor with information about the size and shape of your heart and how well your heart's chambers and valves are working. This procedure takes approximately one hour. There are no restrictions for this procedure.  Follow-Up: Your physician recommends that you schedule a follow-up appointment with Dr. Gwenlyn Found after testing.  If you need a refill on your cardiac medications before your next appointment, please call your pharmacy.

## 2017-05-13 NOTE — Assessment & Plan Note (Signed)
Ms. Gibbon was referred to me by Dr. Baird Cancer for evaluation of bilateral lower extremity edema. I can lead to heart cath on her 20 years ago which was essentially normal. She has had chemotherapy for colon cancer 10 years ago and has required multiple blood transfusions recently for anemia. She's noticed weight gain or lower extremity edema over the last several months. She is on low-dose furosemide. She has 1-2+ pitting edema with elevated neck veins. I am going to get a 2-D echo to further evaluate.

## 2017-05-13 NOTE — Addendum Note (Signed)
Addended by: Zebedee Iba on: 05/13/2017 04:25 PM   Modules accepted: Orders

## 2017-05-15 ENCOUNTER — Ambulatory Visit (HOSPITAL_COMMUNITY)
Admission: RE | Admit: 2017-05-15 | Discharge: 2017-05-15 | Disposition: A | Discharge status: Home / Self Care | Payer: BC Managed Care – PPO | Source: Ambulatory Visit | Attending: Oncology | Admitting: Oncology

## 2017-05-15 ENCOUNTER — Other Ambulatory Visit: Payer: BC Managed Care – PPO

## 2017-05-15 ENCOUNTER — Ambulatory Visit: Payer: BC Managed Care – PPO | Admitting: Oncology

## 2017-05-15 DIAGNOSIS — D649 Anemia, unspecified: Secondary | ICD-10-CM

## 2017-05-16 ENCOUNTER — Other Ambulatory Visit: Payer: Self-pay

## 2017-05-16 ENCOUNTER — Ambulatory Visit (HOSPITAL_COMMUNITY): Payer: BC Managed Care – PPO | Attending: Cardiology

## 2017-05-16 DIAGNOSIS — E119 Type 2 diabetes mellitus without complications: Secondary | ICD-10-CM | POA: Insufficient documentation

## 2017-05-16 DIAGNOSIS — I509 Heart failure, unspecified: Secondary | ICD-10-CM | POA: Diagnosis not present

## 2017-05-16 DIAGNOSIS — Z87891 Personal history of nicotine dependence: Secondary | ICD-10-CM | POA: Insufficient documentation

## 2017-05-16 DIAGNOSIS — E785 Hyperlipidemia, unspecified: Secondary | ICD-10-CM | POA: Diagnosis not present

## 2017-05-16 DIAGNOSIS — I110 Hypertensive heart disease with heart failure: Secondary | ICD-10-CM | POA: Insufficient documentation

## 2017-05-16 DIAGNOSIS — E669 Obesity, unspecified: Secondary | ICD-10-CM | POA: Diagnosis not present

## 2017-05-16 DIAGNOSIS — I38 Endocarditis, valve unspecified: Secondary | ICD-10-CM | POA: Diagnosis not present

## 2017-05-16 DIAGNOSIS — Z6841 Body Mass Index (BMI) 40.0 and over, adult: Secondary | ICD-10-CM | POA: Insufficient documentation

## 2017-05-16 DIAGNOSIS — F1010 Alcohol abuse, uncomplicated: Secondary | ICD-10-CM | POA: Diagnosis not present

## 2017-05-16 DIAGNOSIS — I082 Rheumatic disorders of both aortic and tricuspid valves: Secondary | ICD-10-CM | POA: Diagnosis not present

## 2017-05-16 DIAGNOSIS — I272 Pulmonary hypertension, unspecified: Secondary | ICD-10-CM | POA: Diagnosis not present

## 2017-05-26 ENCOUNTER — Ambulatory Visit: Payer: BC Managed Care – PPO

## 2017-05-26 ENCOUNTER — Other Ambulatory Visit: Payer: Self-pay | Admitting: *Deleted

## 2017-05-26 ENCOUNTER — Other Ambulatory Visit (HOSPITAL_BASED_OUTPATIENT_CLINIC_OR_DEPARTMENT_OTHER): Payer: BC Managed Care – PPO

## 2017-05-26 ENCOUNTER — Ambulatory Visit (HOSPITAL_BASED_OUTPATIENT_CLINIC_OR_DEPARTMENT_OTHER): Payer: BC Managed Care – PPO

## 2017-05-26 ENCOUNTER — Ambulatory Visit (HOSPITAL_COMMUNITY)
Admission: RE | Admit: 2017-05-26 | Discharge: 2017-05-26 | Disposition: A | Discharge status: Home / Self Care | Payer: BC Managed Care – PPO | Source: Ambulatory Visit | Attending: Oncology | Admitting: Oncology

## 2017-05-26 VITALS — BP 184/72 | HR 82 | Temp 99.0°F | Resp 18

## 2017-05-26 DIAGNOSIS — Z95828 Presence of other vascular implants and grafts: Secondary | ICD-10-CM

## 2017-05-26 DIAGNOSIS — D469 Myelodysplastic syndrome, unspecified: Secondary | ICD-10-CM

## 2017-05-26 DIAGNOSIS — Z5111 Encounter for antineoplastic chemotherapy: Secondary | ICD-10-CM

## 2017-05-26 DIAGNOSIS — D649 Anemia, unspecified: Secondary | ICD-10-CM

## 2017-05-26 DIAGNOSIS — D500 Iron deficiency anemia secondary to blood loss (chronic): Secondary | ICD-10-CM

## 2017-05-26 DIAGNOSIS — N181 Chronic kidney disease, stage 1: Secondary | ICD-10-CM

## 2017-05-26 DIAGNOSIS — D631 Anemia in chronic kidney disease: Secondary | ICD-10-CM

## 2017-05-26 LAB — COMPREHENSIVE METABOLIC PANEL
ALT: 23 U/L (ref 0–55)
AST: 29 U/L (ref 5–34)
Albumin: 3.2 g/dL — ABNORMAL LOW (ref 3.5–5.0)
Alkaline Phosphatase: 157 U/L — ABNORMAL HIGH (ref 40–150)
Anion Gap: 12 mEq/L — ABNORMAL HIGH (ref 3–11)
BUN: 29.2 mg/dL — ABNORMAL HIGH (ref 7.0–26.0)
CO2: 24 mEq/L (ref 22–29)
Calcium: 8.9 mg/dL (ref 8.4–10.4)
Chloride: 97 mEq/L — ABNORMAL LOW (ref 98–109)
Creatinine: 1.4 mg/dL — ABNORMAL HIGH (ref 0.6–1.1)
EGFR: 48 mL/min/{1.73_m2} — ABNORMAL LOW (ref >60)
Glucose: 263 mg/dl — ABNORMAL HIGH (ref 70–140)
Potassium: 4 mEq/L (ref 3.5–5.1)
Sodium: 134 mEq/L — ABNORMAL LOW (ref 136–145)
Total Bilirubin: 2.19 mg/dL — ABNORMAL HIGH (ref 0.20–1.20)
Total Protein: 7.3 g/dL (ref 6.4–8.3)

## 2017-05-26 LAB — CBC WITH DIFFERENTIAL/PLATELET
BASO%: 2.8 % — ABNORMAL HIGH (ref 0.0–2.0)
Basophils Absolute: 0.1 10*3/uL (ref 0.0–0.1)
EOS%: 0.6 % (ref 0.0–7.0)
Eosinophils Absolute: 0 10*3/uL (ref 0.0–0.5)
HCT: 17 % — ABNORMAL LOW (ref 34.8–46.6)
HGB: 5.7 g/dL — CL (ref 11.6–15.9)
LYMPH%: 11.1 % — ABNORMAL LOW (ref 14.0–49.7)
MCH: 34.1 pg — ABNORMAL HIGH (ref 25.1–34.0)
MCHC: 33.5 g/dL (ref 31.5–36.0)
MCV: 101.8 fL — ABNORMAL HIGH (ref 79.5–101.0)
MONO#: 0.3 10*3/uL (ref 0.1–0.9)
MONO%: 10.4 % (ref 0.0–14.0)
NEUT#: 2.4 10*3/uL (ref 1.5–6.5)
NEUT%: 75.1 % (ref 38.4–76.8)
Platelets: 303 10*3/uL (ref 145–400)
RBC: 1.67 10*6/uL — ABNORMAL LOW (ref 3.70–5.45)
RDW: 27.7 % — ABNORMAL HIGH (ref 11.2–14.5)
WBC: 3.2 10*3/uL — ABNORMAL LOW (ref 3.9–10.3)
lymph#: 0.4 10*3/uL — ABNORMAL LOW (ref 0.9–3.3)
nRBC: 4 % — ABNORMAL HIGH (ref 0–0)

## 2017-05-26 LAB — PREPARE RBC (CROSSMATCH)

## 2017-05-26 MED ORDER — AZACITIDINE CHEMO SQ INJECTION
76.0000 mg/m2 | Freq: Once | INTRAMUSCULAR | Status: AC
  Administered 2017-05-26: 175 mg via SUBCUTANEOUS
  Filled 2017-05-26: qty 7

## 2017-05-26 MED ORDER — ONDANSETRON HCL 8 MG PO TABS
8.0000 mg | ORAL_TABLET | Freq: Once | ORAL | Status: AC
  Administered 2017-05-26: 8 mg via ORAL

## 2017-05-26 MED ORDER — DIPHENHYDRAMINE HCL 25 MG PO CAPS
ORAL_CAPSULE | ORAL | Status: AC
  Filled 2017-05-26: qty 1

## 2017-05-26 MED ORDER — ACETAMINOPHEN 325 MG PO TABS
ORAL_TABLET | ORAL | Status: AC
  Filled 2017-05-26: qty 2

## 2017-05-26 MED ORDER — FUROSEMIDE 20 MG PO TABS
ORAL_TABLET | ORAL | Status: AC
  Filled 2017-05-26: qty 1

## 2017-05-26 MED ORDER — HEPARIN SOD (PORK) LOCK FLUSH 100 UNIT/ML IV SOLN
500.0000 [IU] | Freq: Every day | INTRAVENOUS | Status: AC | PRN
  Administered 2017-05-26: 500 [IU]
  Filled 2017-05-26: qty 5

## 2017-05-26 MED ORDER — FUROSEMIDE 20 MG PO TABS
20.0000 mg | ORAL_TABLET | Freq: Once | ORAL | Status: AC
  Administered 2017-05-26: 20 mg via ORAL

## 2017-05-26 MED ORDER — SODIUM CHLORIDE 0.9% FLUSH
10.0000 mL | INTRAVENOUS | Status: AC | PRN
  Administered 2017-05-26: 10 mL
  Filled 2017-05-26: qty 10

## 2017-05-26 MED ORDER — SODIUM CHLORIDE 0.9 % IV SOLN
250.0000 mL | Freq: Once | INTRAVENOUS | Status: AC
  Administered 2017-05-26: 250 mL via INTRAVENOUS

## 2017-05-26 MED ORDER — ACETAMINOPHEN 325 MG PO TABS
650.0000 mg | ORAL_TABLET | Freq: Once | ORAL | Status: AC
  Administered 2017-05-26: 650 mg via ORAL

## 2017-05-26 MED ORDER — DIPHENHYDRAMINE HCL 25 MG PO CAPS
25.0000 mg | ORAL_CAPSULE | Freq: Once | ORAL | Status: AC
  Administered 2017-05-26: 25 mg via ORAL

## 2017-05-26 MED ORDER — PROCHLORPERAZINE MALEATE 10 MG PO TABS
10.0000 mg | ORAL_TABLET | Freq: Once | ORAL | Status: AC
  Administered 2017-05-26: 10 mg via ORAL

## 2017-05-26 MED ORDER — PROCHLORPERAZINE MALEATE 10 MG PO TABS
ORAL_TABLET | ORAL | Status: AC
  Filled 2017-05-26: qty 1

## 2017-05-26 MED ORDER — SODIUM CHLORIDE 0.9% FLUSH
10.0000 mL | INTRAVENOUS | Status: DC | PRN
  Filled 2017-05-26: qty 10

## 2017-05-26 MED ORDER — ONDANSETRON HCL 8 MG PO TABS
ORAL_TABLET | ORAL | Status: AC
  Filled 2017-05-26: qty 1

## 2017-05-26 NOTE — Progress Notes (Signed)
Per Cherrie Distance for Dr Alen Blew, ok to tx today with todays labs Bili 2.19 anf Hgb 5.7, pt to receive 2 units PRBC's.

## 2017-05-26 NOTE — Patient Instructions (Addendum)

## 2017-05-27 ENCOUNTER — Ambulatory Visit (HOSPITAL_BASED_OUTPATIENT_CLINIC_OR_DEPARTMENT_OTHER): Payer: BC Managed Care – PPO

## 2017-05-27 VITALS — BP 151/67 | HR 81 | Temp 98.4°F | Resp 18

## 2017-05-27 DIAGNOSIS — D469 Myelodysplastic syndrome, unspecified: Secondary | ICD-10-CM | POA: Diagnosis not present

## 2017-05-27 DIAGNOSIS — Z5111 Encounter for antineoplastic chemotherapy: Secondary | ICD-10-CM | POA: Diagnosis not present

## 2017-05-27 LAB — BPAM RBC
Blood Product Expiration Date: 201811302359
Blood Product Expiration Date: 201812022359
ISSUE DATE / TIME: 201811121309
ISSUE DATE / TIME: 201811121510
Unit Type and Rh: 600
Unit Type and Rh: 600

## 2017-05-27 LAB — TYPE AND SCREEN
ABO/RH(D): A NEG
Antibody Screen: NEGATIVE
Unit division: 0
Unit division: 0

## 2017-05-27 MED ORDER — AZACITIDINE CHEMO SQ INJECTION
76.0000 mg/m2 | Freq: Once | INTRAMUSCULAR | Status: AC
  Administered 2017-05-27: 175 mg via SUBCUTANEOUS
  Filled 2017-05-27: qty 7

## 2017-05-27 MED ORDER — ONDANSETRON HCL 8 MG PO TABS
8.0000 mg | ORAL_TABLET | Freq: Once | ORAL | Status: AC
  Administered 2017-05-27: 8 mg via ORAL

## 2017-05-27 MED ORDER — ONDANSETRON HCL 8 MG PO TABS
ORAL_TABLET | ORAL | Status: AC
  Filled 2017-05-27: qty 1

## 2017-05-27 NOTE — Patient Instructions (Signed)
[facility] Discharge Instructions for Patients Receiving Chemotherapy  Today you received the following chemotherapy agents Vidaza  To help prevent nausea and vomiting after your treatment, we encourage you to take your nausea medication as directed  If you develop nausea and vomiting that is not controlled by your nausea medication, call the clinic.   BELOW ARE SYMPTOMS THAT SHOULD BE REPORTED IMMEDIATELY:  *FEVER GREATER THAN 100.5 F  *CHILLS WITH OR WITHOUT FEVER  NAUSEA AND VOMITING THAT IS NOT CONTROLLED WITH YOUR NAUSEA MEDICATION  *UNUSUAL SHORTNESS OF BREATH  *UNUSUAL BRUISING OR BLEEDING  TENDERNESS IN MOUTH AND THROAT WITH OR WITHOUT PRESENCE OF ULCERS  *URINARY PROBLEMS  *BOWEL PROBLEMS  UNUSUAL RASH Items with * indicate a potential emergency and should be followed up as soon as possible.  Feel free to call the clinic should you have any questions or concerns. The clinic phone number is [phone].  Please show the CHEMO ALERT CARD at check-in to the Emergency Department and triage nurse.   

## 2017-05-28 ENCOUNTER — Ambulatory Visit (HOSPITAL_BASED_OUTPATIENT_CLINIC_OR_DEPARTMENT_OTHER): Payer: BC Managed Care – PPO

## 2017-05-28 VITALS — BP 160/65 | HR 85 | Temp 98.6°F | Resp 18

## 2017-05-28 DIAGNOSIS — D469 Myelodysplastic syndrome, unspecified: Secondary | ICD-10-CM | POA: Diagnosis not present

## 2017-05-28 DIAGNOSIS — Z5111 Encounter for antineoplastic chemotherapy: Secondary | ICD-10-CM

## 2017-05-28 MED ORDER — AZACITIDINE CHEMO SQ INJECTION
76.0000 mg/m2 | Freq: Once | INTRAMUSCULAR | Status: AC
  Administered 2017-05-28: 175 mg via SUBCUTANEOUS
  Filled 2017-05-28: qty 7

## 2017-05-28 MED ORDER — ONDANSETRON HCL 8 MG PO TABS
8.0000 mg | ORAL_TABLET | Freq: Once | ORAL | Status: AC
  Administered 2017-05-28: 8 mg via ORAL

## 2017-05-28 MED ORDER — ONDANSETRON HCL 8 MG PO TABS
ORAL_TABLET | ORAL | Status: AC
  Filled 2017-05-28: qty 1

## 2017-05-29 ENCOUNTER — Other Ambulatory Visit: Payer: BC Managed Care – PPO

## 2017-05-29 ENCOUNTER — Ambulatory Visit: Payer: BC Managed Care – PPO

## 2017-05-29 ENCOUNTER — Ambulatory Visit (HOSPITAL_BASED_OUTPATIENT_CLINIC_OR_DEPARTMENT_OTHER): Payer: BC Managed Care – PPO

## 2017-05-29 ENCOUNTER — Other Ambulatory Visit: Payer: Self-pay | Admitting: Oncology

## 2017-05-29 VITALS — BP 152/50 | HR 81 | Temp 97.8°F | Resp 18

## 2017-05-29 DIAGNOSIS — D469 Myelodysplastic syndrome, unspecified: Secondary | ICD-10-CM

## 2017-05-29 DIAGNOSIS — Z5111 Encounter for antineoplastic chemotherapy: Secondary | ICD-10-CM

## 2017-05-29 MED ORDER — ONDANSETRON HCL 8 MG PO TABS
ORAL_TABLET | ORAL | Status: AC
  Filled 2017-05-29: qty 1

## 2017-05-29 MED ORDER — ONDANSETRON HCL 8 MG PO TABS
8.0000 mg | ORAL_TABLET | Freq: Once | ORAL | Status: AC
  Administered 2017-05-29: 8 mg via ORAL

## 2017-05-29 MED ORDER — AZACITIDINE CHEMO SQ INJECTION
76.0000 mg/m2 | Freq: Once | INTRAMUSCULAR | Status: AC
  Administered 2017-05-29: 175 mg via SUBCUTANEOUS
  Filled 2017-05-29: qty 7

## 2017-05-29 NOTE — Patient Instructions (Signed)
[facility] Discharge Instructions for Patients Receiving Chemotherapy  Today you received the following chemotherapy agents Vidaza  To help prevent nausea and vomiting after your treatment, we encourage you to take your nausea medication as directed  If you develop nausea and vomiting that is not controlled by your nausea medication, call the clinic.   BELOW ARE SYMPTOMS THAT SHOULD BE REPORTED IMMEDIATELY:  *FEVER GREATER THAN 100.5 F  *CHILLS WITH OR WITHOUT FEVER  NAUSEA AND VOMITING THAT IS NOT CONTROLLED WITH YOUR NAUSEA MEDICATION  *UNUSUAL SHORTNESS OF BREATH  *UNUSUAL BRUISING OR BLEEDING  TENDERNESS IN MOUTH AND THROAT WITH OR WITHOUT PRESENCE OF ULCERS  *URINARY PROBLEMS  *BOWEL PROBLEMS  UNUSUAL RASH Items with * indicate a potential emergency and should be followed up as soon as possible.  Feel free to call the clinic should you have any questions or concerns. The clinic phone number is [phone].  Please show the CHEMO ALERT CARD at check-in to the Emergency Department and triage nurse.   

## 2017-05-29 NOTE — Progress Notes (Signed)
Per Dr. Alen Blew okay to tx with labs from 11/12; Hgb 5.7 and Bilirubin 2.19. Pt received 2U PRBCs on 11/12.

## 2017-05-30 ENCOUNTER — Ambulatory Visit (HOSPITAL_BASED_OUTPATIENT_CLINIC_OR_DEPARTMENT_OTHER): Payer: BC Managed Care – PPO

## 2017-05-30 VITALS — BP 144/59 | HR 77 | Temp 98.1°F | Resp 18 | Ht 64.0 in

## 2017-05-30 DIAGNOSIS — D469 Myelodysplastic syndrome, unspecified: Secondary | ICD-10-CM

## 2017-05-30 DIAGNOSIS — Z5111 Encounter for antineoplastic chemotherapy: Secondary | ICD-10-CM | POA: Diagnosis not present

## 2017-05-30 MED ORDER — ONDANSETRON HCL 8 MG PO TABS
ORAL_TABLET | ORAL | Status: AC
  Filled 2017-05-30: qty 1

## 2017-05-30 MED ORDER — ONDANSETRON HCL 8 MG PO TABS
8.0000 mg | ORAL_TABLET | Freq: Once | ORAL | Status: AC
  Administered 2017-05-30: 8 mg via ORAL

## 2017-05-30 MED ORDER — AZACITIDINE CHEMO SQ INJECTION
175.0000 mg | Freq: Once | INTRAMUSCULAR | Status: AC
  Administered 2017-05-30: 175 mg via SUBCUTANEOUS
  Filled 2017-05-30: qty 7

## 2017-05-30 NOTE — Patient Instructions (Signed)
[facility] Discharge Instructions for Patients Receiving Chemotherapy  Today you received the following chemotherapy agents Vidaza  To help prevent nausea and vomiting after your treatment, we encourage you to take your nausea medication as directed  If you develop nausea and vomiting that is not controlled by your nausea medication, call the clinic.   BELOW ARE SYMPTOMS THAT SHOULD BE REPORTED IMMEDIATELY:  *FEVER GREATER THAN 100.5 F  *CHILLS WITH OR WITHOUT FEVER  NAUSEA AND VOMITING THAT IS NOT CONTROLLED WITH YOUR NAUSEA MEDICATION  *UNUSUAL SHORTNESS OF BREATH  *UNUSUAL BRUISING OR BLEEDING  TENDERNESS IN MOUTH AND THROAT WITH OR WITHOUT PRESENCE OF ULCERS  *URINARY PROBLEMS  *BOWEL PROBLEMS  UNUSUAL RASH Items with * indicate a potential emergency and should be followed up as soon as possible.  Feel free to call the clinic should you have any questions or concerns. The clinic phone number is [phone].  Please show the CHEMO ALERT CARD at check-in to the Emergency Department and triage nurse.   

## 2017-06-02 ENCOUNTER — Ambulatory Visit (HOSPITAL_COMMUNITY)
Admission: RE | Admit: 2017-06-02 | Discharge: 2017-06-02 | Disposition: A | Discharge status: Home / Self Care | Payer: BC Managed Care – PPO | Source: Ambulatory Visit | Attending: Internal Medicine | Admitting: Internal Medicine

## 2017-06-02 DIAGNOSIS — R0989 Other specified symptoms and signs involving the circulatory and respiratory systems: Secondary | ICD-10-CM | POA: Diagnosis present

## 2017-06-02 DIAGNOSIS — I6523 Occlusion and stenosis of bilateral carotid arteries: Secondary | ICD-10-CM | POA: Diagnosis not present

## 2017-06-03 ENCOUNTER — Telehealth: Payer: Self-pay | Admitting: Oncology

## 2017-06-03 NOTE — Telephone Encounter (Signed)
06/03/2017 @ 1:48 faxed office treatment records from 03/20/2017 - present to Rosamaria Lints @ [phone] for Daisy #: [phone].  Contacted patient to inform her that it was successfully faxed.

## 2017-06-10 ENCOUNTER — Ambulatory Visit: Payer: BC Managed Care – PPO | Admitting: Cardiovascular Disease

## 2017-06-10 ENCOUNTER — Encounter: Payer: Self-pay | Admitting: Cardiovascular Disease

## 2017-06-10 ENCOUNTER — Telehealth: Payer: Self-pay

## 2017-06-10 ENCOUNTER — Ambulatory Visit
Admission: RE | Admit: 2017-06-10 | Discharge: 2017-06-10 | Disposition: A | Discharge status: Home / Self Care | Payer: BC Managed Care – PPO | Source: Ambulatory Visit | Attending: Cardiovascular Disease | Admitting: Cardiovascular Disease

## 2017-06-10 DIAGNOSIS — I350 Nonrheumatic aortic (valve) stenosis: Secondary | ICD-10-CM | POA: Diagnosis not present

## 2017-06-10 DIAGNOSIS — I6523 Occlusion and stenosis of bilateral carotid arteries: Secondary | ICD-10-CM

## 2017-06-10 DIAGNOSIS — I779 Disorder of arteries and arterioles, unspecified: Secondary | ICD-10-CM | POA: Insufficient documentation

## 2017-06-10 DIAGNOSIS — I739 Peripheral vascular disease, unspecified: Secondary | ICD-10-CM

## 2017-06-10 NOTE — Assessment & Plan Note (Signed)
History of essential hypertension blood pressure measured 144/60. She is on Edarbychlor 40-12.5 in addition to hydralazine and metoprolol. Continue current meds at current dosing

## 2017-06-10 NOTE — Progress Notes (Signed)
06/10/2017 Patricia Mata   1955-07-28  [id number]  Primary Physician Glendale Chard, MD Primary Cardiologist: Lorretta Harp MD [name], [place]  HPI:  Patricia Mata is a 61 y.o.  married African-American female mother of one child, grandmother of 2 children referred by Dr. Baird Cancer for cardiac evaluation because of lower extremity edema. She is accompanied by her husband Patricia Mata and daughter Patricia Mata . I last saw her in the office10/30/18.I apparently saw her 20 years ago which time I did a heart catheterization on her revealing normal coronary arteries and normal LV function. She was referred for evaluation of bilateral lower extremity edema. She does have a history of hypertension, hyperlipidemia and diabetes. She stopped smoking 20 years ago. She's never had a heart attack or stroke. Her father did have a myocardial infarction in his late 26s and died from that. She had colon cancer 10 years ago and had surgical cure with chemotherapy. She remains cancer free. She's recently had iron deficiency anemia and has undergone multiple transfusions. She's noticed increasing lower extremity edema over the last several weeks to months. She also drinks 2 mixed drinks a day. Since I saw her she was begun on low-dose furosemide which resulted in improvement in her edema and dyspnea. Outpatient diagnostic studies included carotid Dopplers performed 06/03/17 that showed an occluded right ICA with moderate left ICA stenosis. 2-D echo performed 05/16/17 showed normal LV systolic function, moderate concentric LVH with grade 2 diastolic dysfunction and severe aortic stenosis with valve area 0.7 cm and a peak gradient of 70 mmHg.     Current Meds  Medication Sig  . atorvastatin (LIPITOR) 40 MG tablet Take 40 mg by mouth daily.  . Azilsartan-Chlorthalidone (EDARBYCLOR) 40-12.5 MG TABS Take 1 tablet by mouth daily with lunch.  . colchicine 0.6 MG tablet Take 0.6 mg by mouth daily with lunch.   . furosemide  (LASIX) 20 MG tablet TAKE 1 TABLET BY MOUTH TWICE A DAY  . glimepiride (AMARYL) 2 MG tablet Take 2 mg by mouth daily.   . hydrALAZINE (APRESOLINE) 25 MG tablet Take 50 mg by mouth 2 (two) times daily.  Marland Kitchen JANUMET XR 50-500 MG TB24 Take 1 tablet by mouth daily.  Marland Kitchen latanoprost (XALATAN) 0.005 % ophthalmic solution Place 1 drop into both eyes at bedtime.   . lidocaine-prilocaine (EMLA) cream Apply a quarter size of cream to port 1-2 hours prior to access. Cover with saran wrap.  . metoprolol (LOPRESSOR) 50 MG tablet Take 50 mg by mouth 2 (two) times daily.   . prochlorperazine (COMPAZINE) 10 MG tablet Take 1 tablet (10 mg total) by mouth every 6 (six) hours as needed for nausea or vomiting.  . sitaGLIPtin-metformin (JANUMET) 50-1000 MG tablet Take 1 tablet by mouth 2 (two) times daily.      Allergies  Allergen Reactions  . Ancef [Cefazolin] Itching    Severe itching- after procedure, ancef was the antibiotic.-04/02/17  . Sulfa Antibiotics Rash and Other (See Comments)    blisters    Social History   Socioeconomic History  . Marital status: Married    Spouse name: Not on file  . Number of children: Not on file  . Years of education: Not on file  . Highest education level: Not on file  Social Needs  . Financial resource strain: Not on file  . Food insecurity - worry: Not on file  . Food insecurity - inability: Not on file  . Transportation needs - medical: Not on file  .  Transportation needs - non-medical: Not on file  Occupational History  . Not on file  Tobacco Use  . Smoking status: Former Smoker    Types: Cigarettes    Last attempt to quit: 01/31/2001    Years since quitting: 16.3  . Smokeless tobacco: Former Network engineer and Sexual Activity  . Alcohol use: Yes    Alcohol/week: 1.2 oz    Types: 2 Shots of liquor per week  . Drug use: Not on file  . Sexual activity: Not on file  Other Topics Concern  . Not on file  Social History Narrative  . Not on file     Review  of Systems: General: negative for chills, fever, night sweats or weight changes.  Cardiovascular: negative for chest pain, dyspnea on exertion, edema, orthopnea, palpitations, paroxysmal nocturnal dyspnea or shortness of breath Dermatological: negative for rash Respiratory: negative for cough or wheezing Urologic: negative for hematuria Abdominal: negative for nausea, vomiting, diarrhea, bright red blood per rectum, melena, or hematemesis Neurologic: negative for visual changes, syncope, or dizziness All other systems reviewed and are otherwise negative except as noted above.    Blood pressure (!) 144/60, pulse 85, height 5\' 4"  (1.626 m), weight 247 lb (112 kg), SpO2 96 %.  General appearance: alert and no distress Neck: no adenopathy, no carotid bruit, no JVD, supple, symmetrical, trachea midline and thyroid not enlarged, symmetric, no tenderness/mass/nodules Lungs: clear to auscultation bilaterally Heart: regular rate and rhythm, S1, S2 normal, no murmur, click, rub or gallop Extremities: 1+ edema bilaterally Pulses: 2+ and symmetric Skin: Skin color, texture, turgor normal. No rashes or lesions Neurologic: Alert and oriented X 3, normal strength and tone. Normal symmetric reflexes. Normal coordination and gait  EKG not performed today  ASSESSMENT AND PLAN:   Severe aortic stenosis [name] returns today for follow-up for outpatient studies.Her 2-D echocardiogram performed 05/16/16 revealed normal LV systolic function, moderate concentric LVH, grade 2 diastolic dysfunction and severe aortic stenosis with valve area 0.7 cm and a peak gradient of 70 mmHg. I'm going to arrange outpatient right and left heart cath to further define her anatomy and physiology.The patient understands that risks included but are not limited to stroke (1 in 1000), death (1 in 39), kidney failure [usually temporary] (1 in 500), bleeding (1 in 200), allergic reaction [possibly serious] (1 in 200). The  patient understands and agrees to proceed  Carotid artery disease ([place]) This Levit returns today for follow-up of her diagnostic test. Recent carotid Doppler study performed 06/03/17 revealed an occluded right internal carotid artery with moderate left ICA stenosis. We will repeat this on an annual basis.  Essential hypertension History of essential hypertension blood pressure measured 144/60. She is on Edarbychlor 40-12.5 in addition to hydralazine and metoprolol. Continue current meds at current dosing  Hyperlipidemia History of hyperlipidemia on statin therapy followed by her PCP  Bilateral lower extremity edema History of lower extremity edema associated with dyspnea improved with the recent addition of low-dose furosemide in addition to her hydrochlorothiazide. She has lost 6 pounds. She is aware of all restriction.      Lorretta Harp MD FACP,FACC,FAHA, [facility] 06/10/2017 9:48 AM

## 2017-06-10 NOTE — Telephone Encounter (Signed)
Received call from Santiago Glad at [facility] with Critical lab for pt. Glucose is 506, "in contact with cells"  Result not available in Epic yet

## 2017-06-10 NOTE — Assessment & Plan Note (Signed)
This Teas returns today for follow-up of her diagnostic test. Recent carotid Doppler study performed 06/03/17 revealed an occluded right internal carotid artery with moderate left ICA stenosis. We will repeat this on an annual basis.

## 2017-06-10 NOTE — Telephone Encounter (Signed)
Per Dr Gwenlyn Found, Call Dr Baird Cancer and inform to take care of high glucose level and let them know that this has to be controlled before the procedure or we will have to cancel. We will need a stat lab faxed for verification.  Called [facility] s/w Dr Baird Cancer CMA-Katina relayed message from Dr Gwenlyn Found above. she will inform MD for review. They will call pt and let us know outcome. Fax number given they will fax result over for review once received.

## 2017-06-10 NOTE — Assessment & Plan Note (Signed)
History of hyperlipidemia on statin therapy followed by her PCP. 

## 2017-06-10 NOTE — Assessment & Plan Note (Signed)
History of lower extremity edema associated with dyspnea improved with the recent addition of low-dose furosemide in addition to her hydrochlorothiazide. She has lost 6 pounds. She is aware of all restriction.

## 2017-06-10 NOTE — Assessment & Plan Note (Signed)
Patricia Mata returns today for follow-up for outpatient studies.Her 2-D echocardiogram performed 05/16/16 revealed normal LV systolic function, moderate concentric LVH, grade 2 diastolic dysfunction and severe aortic stenosis with valve area 0.7 cm and a peak gradient of 70 mmHg. I'm going to arrange outpatient right and left heart cath to further define her anatomy and physiology.The patient understands that risks included but are not limited to stroke (1 in 1000), death (1 in 50), kidney failure [usually temporary] (1 in 500), bleeding (1 in 200), allergic reaction [possibly serious] (1 in 200). The patient understands and agrees to proceed

## 2017-06-10 NOTE — Patient Instructions (Signed)
   [facility] [address] [place] [address] Dept: [phone] Loc: [phone]  Katharyn Schauer  06/10/2017  You are scheduled for a Cardiac Catheterization on Thursday, November 29 with Dr. Quay Burow.  1. Please arrive at the [facility] (Main Entrance A) at [facility]: [address] [place], [place] [address] at 11:00 AM (two hours before your procedure to ensure your preparation). Free valet parking service is available.   Special note: Every effort is made to have your procedure done on time. Please understand that emergencies sometimes delay scheduled procedures.  2. Diet: Do not eat or drink anything after midnight prior to your procedure except sips of water to take medications.  3. Labs: Please have labs drawn in office today.  --A chest x-ray takes a picture of the organs and structures inside the chest, including the heart, lungs, and blood vessels. This test can show several things, including, whether the heart is enlarges; whether fluid is building up in the lungs; and whether pacemaker / defibrillator leads are still in place.   4. Medication instructions in preparation for your procedure:  Stop taking, Janumet (Metformin + Sitagliptin) on Wednesday, November 28. You will also hold this for 48 hours after your procedure.  Hold all other diabetic medications the morning of your procedure.  On the morning of your procedure, take any morning medicines NOT listed above.  You may use sips of water.  5. Plan for one night stay--bring personal belongings. 6. Bring a current list of your medications and current insurance cards. 7. You MUST have a responsible person to drive you home. 8. Someone MUST be with you the first 24 hours after you arrive home or your discharge will be delayed. 9. Please wear clothes that are easy to get on and off and wear slip-on shoes.  Thank  you for allowing [place] to care for you!   --  Invasive Cardiovascular services   Post-procedure Follow-up:  Your physician recommends that you schedule a follow-up appointment with Dr. Gwenlyn Found 1-2 weeks after procedure.

## 2017-06-10 NOTE — Telephone Encounter (Signed)
Message given to Taylor-will talk to Dr Gwenlyn Found

## 2017-06-11 LAB — BASIC METABOLIC PANEL
BUN/Creatinine Ratio: 19 (ref 12–28)
BUN: 23 mg/dL (ref 8–27)
CO2: 22 mmol/L (ref 20–29)
Calcium: 9 mg/dL (ref 8.7–10.3)
Chloride: 89 mmol/L — ABNORMAL LOW (ref 96–106)
Creatinine, Ser: 1.18 mg/dL — ABNORMAL HIGH (ref 0.57–1.00)
GFR calc Af Amer: 57 mL/min/{1.73_m2} — ABNORMAL LOW (ref >59)
GFR calc non Af Amer: 50 mL/min/{1.73_m2} — ABNORMAL LOW (ref >59)
Glucose: 506 mg/dL (ref 65–99)
Potassium: 4.7 mmol/L (ref 3.5–5.2)
Sodium: 132 mmol/L — ABNORMAL LOW (ref 134–144)

## 2017-06-11 LAB — PROTIME-INR
INR: 1.3 — ABNORMAL HIGH (ref 0.8–1.2)
Prothrombin Time: 13.4 s — ABNORMAL HIGH (ref 9.1–12.0)

## 2017-06-11 LAB — CBC WITH DIFFERENTIAL/PLATELET
Basophils Absolute: 0 10*3/uL (ref 0.0–0.2)
Basos: 1 %
EOS (ABSOLUTE): 0 10*3/uL (ref 0.0–0.4)
Eos: 1 %
Hematocrit: 19 % — ABNORMAL LOW (ref 34.0–46.6)
Hemoglobin: 6.3 g/dL — CL (ref 11.1–15.9)
Immature Grans (Abs): 0 10*3/uL (ref 0.0–0.1)
Immature Granulocytes: 0 %
Lymphocytes Absolute: 0.5 10*3/uL — ABNORMAL LOW (ref 0.7–3.1)
Lymphs: 15 %
MCH: 33.3 pg — ABNORMAL HIGH (ref 26.6–33.0)
MCHC: 33.2 g/dL (ref 31.5–35.7)
MCV: 101 fL — ABNORMAL HIGH (ref 79–97)
Monocytes Absolute: 0.6 10*3/uL (ref 0.1–0.9)
Monocytes: 18 %
NRBC: 3 % — ABNORMAL HIGH (ref 0–0)
Neutrophils Absolute: 2 10*3/uL (ref 1.4–7.0)
Neutrophils: 65 %
Platelets: 251 10*3/uL (ref 150–379)
RBC: 1.89 x10E6/uL — CL (ref 3.77–5.28)
RDW: 26.1 % — ABNORMAL HIGH (ref 12.3–15.4)
WBC: 3.1 10*3/uL — ABNORMAL LOW (ref 3.4–10.8)

## 2017-06-11 LAB — APTT: aPTT: 27 s (ref 24–33)

## 2017-06-11 LAB — TSH: TSH: 11.11 u[IU]/mL — ABNORMAL HIGH (ref 0.450–4.500)

## 2017-06-11 NOTE — Telephone Encounter (Signed)
[place]. Spoke to [name], FNP concerning pt glucose. They have left the pt 3 messages and have not heard back from pt. At this poitn, she stated there is not much they will be able to do to bring her glucose down enough for the procedure tomorrow.   Because of this, pt procedure has been cancelled. I will call pt to make aware. If I am able to reach pt, will have her call [facility] To regulate her glucose. [name] stated she would give [place] a call in the next couple of days with an update.

## 2017-06-11 NOTE — Telephone Encounter (Signed)
Spoke to pt. Pt was unsure if she was taking her diabetic medications "because she has been taking her blood pressure medicine." She stated she will call [facility] to get straightened out. Informed pt that her procedure has been cancelled and when we receive an update on her glucose levels we will let her know what the next steps are.

## 2017-06-12 ENCOUNTER — Encounter (HOSPITAL_COMMUNITY): Admission: RE | Payer: Self-pay | Source: Ambulatory Visit

## 2017-06-12 ENCOUNTER — Ambulatory Visit (HOSPITAL_COMMUNITY)
Admission: RE | Admit: 2017-06-12 | Payer: BC Managed Care – PPO | Source: Ambulatory Visit | Admitting: Cardiovascular Disease

## 2017-06-12 SURGERY — RIGHT/LEFT HEART CATH AND CORONARY ANGIOGRAPHY
Anesthesia: LOCAL

## 2017-06-14 ENCOUNTER — Inpatient Hospital Stay (HOSPITAL_COMMUNITY)
Admission: EM | Admit: 2017-06-14 | Discharge: 2017-06-16 | DRG: 812 | Disposition: A | Discharge status: Home / Self Care | Payer: BC Managed Care – PPO | Attending: Internal Medicine | Admitting: Internal Medicine

## 2017-06-14 ENCOUNTER — Emergency Department (HOSPITAL_COMMUNITY): Payer: BC Managed Care – PPO

## 2017-06-14 ENCOUNTER — Encounter (HOSPITAL_COMMUNITY): Payer: Self-pay

## 2017-06-14 ENCOUNTER — Other Ambulatory Visit: Payer: Self-pay

## 2017-06-14 DIAGNOSIS — Z85038 Personal history of other malignant neoplasm of large intestine: Secondary | ICD-10-CM

## 2017-06-14 DIAGNOSIS — E1122 Type 2 diabetes mellitus with diabetic chronic kidney disease: Secondary | ICD-10-CM | POA: Diagnosis present

## 2017-06-14 DIAGNOSIS — I10 Essential (primary) hypertension: Secondary | ICD-10-CM | POA: Diagnosis present

## 2017-06-14 DIAGNOSIS — Z8249 Family history of ischemic heart disease and other diseases of the circulatory system: Secondary | ICD-10-CM

## 2017-06-14 DIAGNOSIS — Z87891 Personal history of nicotine dependence: Secondary | ICD-10-CM

## 2017-06-14 DIAGNOSIS — D649 Anemia, unspecified: Secondary | ICD-10-CM | POA: Diagnosis not present

## 2017-06-14 DIAGNOSIS — I129 Hypertensive chronic kidney disease with stage 1 through stage 4 chronic kidney disease, or unspecified chronic kidney disease: Secondary | ICD-10-CM | POA: Diagnosis present

## 2017-06-14 DIAGNOSIS — I779 Disorder of arteries and arterioles, unspecified: Secondary | ICD-10-CM | POA: Diagnosis present

## 2017-06-14 DIAGNOSIS — E876 Hypokalemia: Secondary | ICD-10-CM | POA: Diagnosis present

## 2017-06-14 DIAGNOSIS — Z881 Allergy status to other antibiotic agents status: Secondary | ICD-10-CM

## 2017-06-14 DIAGNOSIS — D46A Refractory cytopenia with multilineage dysplasia: Secondary | ICD-10-CM | POA: Diagnosis not present

## 2017-06-14 DIAGNOSIS — Z882 Allergy status to sulfonamides status: Secondary | ICD-10-CM

## 2017-06-14 DIAGNOSIS — E1165 Type 2 diabetes mellitus with hyperglycemia: Secondary | ICD-10-CM | POA: Diagnosis present

## 2017-06-14 DIAGNOSIS — N183 Chronic kidney disease, stage 3 (moderate): Secondary | ICD-10-CM | POA: Diagnosis present

## 2017-06-14 DIAGNOSIS — D469 Myelodysplastic syndrome, unspecified: Secondary | ICD-10-CM | POA: Diagnosis present

## 2017-06-14 DIAGNOSIS — I350 Nonrheumatic aortic (valve) stenosis: Secondary | ICD-10-CM

## 2017-06-14 DIAGNOSIS — Z8042 Family history of malignant neoplasm of prostate: Secondary | ICD-10-CM

## 2017-06-14 DIAGNOSIS — Z7984 Long term (current) use of oral hypoglycemic drugs: Secondary | ICD-10-CM

## 2017-06-14 DIAGNOSIS — I739 Peripheral vascular disease, unspecified: Secondary | ICD-10-CM

## 2017-06-14 DIAGNOSIS — I2510 Atherosclerotic heart disease of native coronary artery without angina pectoris: Secondary | ICD-10-CM | POA: Diagnosis present

## 2017-06-14 LAB — COMPREHENSIVE METABOLIC PANEL
ALT: 26 U/L (ref 14–54)
AST: 35 U/L (ref 15–41)
Albumin: 3 g/dL — ABNORMAL LOW (ref 3.5–5.0)
Alkaline Phosphatase: 164 U/L — ABNORMAL HIGH (ref 38–126)
Anion gap: 12 (ref 5–15)
BUN: 27 mg/dL — ABNORMAL HIGH (ref 6–20)
CO2: 24 mmol/L (ref 22–32)
Calcium: 8.6 mg/dL — ABNORMAL LOW (ref 8.9–10.3)
Chloride: 92 mmol/L — ABNORMAL LOW (ref 101–111)
Creatinine, Ser: 1.44 mg/dL — ABNORMAL HIGH (ref 0.44–1.00)
GFR calc Af Amer: 44 mL/min — ABNORMAL LOW (ref >60)
GFR calc non Af Amer: 38 mL/min — ABNORMAL LOW (ref >60)
Glucose, Bld: 456 mg/dL — ABNORMAL HIGH (ref 65–99)
Potassium: 4.2 mmol/L (ref 3.5–5.1)
Sodium: 128 mmol/L — ABNORMAL LOW (ref 135–145)
Total Bilirubin: 3.2 mg/dL — ABNORMAL HIGH (ref 0.3–1.2)
Total Protein: 6.7 g/dL (ref 6.5–8.1)

## 2017-06-14 LAB — CBC WITH DIFFERENTIAL/PLATELET
Basophils Absolute: 0 10*3/uL (ref 0.0–0.1)
Basophils Relative: 2 %
Eosinophils Absolute: 0 10*3/uL (ref 0.0–0.7)
Eosinophils Relative: 1 %
HCT: 18.1 % — ABNORMAL LOW (ref 36.0–46.0)
Hemoglobin: 6.2 g/dL — CL (ref 12.0–15.0)
Lymphocytes Relative: 15 %
Lymphs Abs: 0.3 10*3/uL — ABNORMAL LOW (ref 0.7–4.0)
MCH: 33.2 pg (ref 26.0–34.0)
MCHC: 34.3 g/dL (ref 30.0–36.0)
MCV: 96.8 fL (ref 78.0–100.0)
Monocytes Absolute: 0.2 10*3/uL (ref 0.1–1.0)
Monocytes Relative: 11 %
Neutro Abs: 1.2 10*3/uL — ABNORMAL LOW (ref 1.7–7.7)
Neutrophils Relative %: 71 %
Platelets: 283 10*3/uL (ref 150–400)
RBC: 1.87 MIL/uL — ABNORMAL LOW (ref 3.87–5.11)
RDW: 27.2 % — ABNORMAL HIGH (ref 11.5–15.5)
WBC: 1.7 10*3/uL — ABNORMAL LOW (ref 4.0–10.5)

## 2017-06-14 LAB — CBC
HCT: 16.5 % — ABNORMAL LOW (ref 36.0–46.0)
Hemoglobin: 5.7 g/dL — CL (ref 12.0–15.0)
MCH: 33.9 pg (ref 26.0–34.0)
MCHC: 34.5 g/dL (ref 30.0–36.0)
MCV: 98.2 fL (ref 78.0–100.0)
Platelets: 277 10*3/uL (ref 150–400)
RBC: 1.68 MIL/uL — ABNORMAL LOW (ref 3.87–5.11)
RDW: 27.7 % — ABNORMAL HIGH (ref 11.5–15.5)
WBC: 1.7 10*3/uL — ABNORMAL LOW (ref 4.0–10.5)

## 2017-06-14 LAB — HEMOGLOBIN AND HEMATOCRIT, BLOOD
HCT: 23.2 % — ABNORMAL LOW (ref 36.0–46.0)
Hemoglobin: 8 g/dL — ABNORMAL LOW (ref 12.0–15.0)

## 2017-06-14 LAB — PREPARE RBC (CROSSMATCH)

## 2017-06-14 LAB — GLUCOSE, CAPILLARY
Glucose-Capillary: 458 mg/dL — ABNORMAL HIGH (ref 65–99)
Glucose-Capillary: 422 mg/dL — ABNORMAL HIGH (ref 65–99)

## 2017-06-14 MED ORDER — INSULIN ASPART 100 UNIT/ML ~~LOC~~ SOLN
7.0000 [IU] | Freq: Once | SUBCUTANEOUS | Status: AC
  Administered 2017-06-14: 7 [IU] via SUBCUTANEOUS

## 2017-06-14 MED ORDER — AZILSARTAN-CHLORTHALIDONE 40-12.5 MG PO TABS: 1.0000 | ORAL_TABLET | Freq: Every day | ORAL | Status: DC

## 2017-06-14 MED ORDER — ONDANSETRON HCL 4 MG PO TABS: 4.0000 mg | ORAL_TABLET | Freq: Four times a day (QID) | ORAL | Status: DC | PRN

## 2017-06-14 MED ORDER — SODIUM CHLORIDE 0.9 % IV SOLN
Freq: Once | INTRAVENOUS | Status: AC
  Administered 2017-06-14: via INTRAVENOUS

## 2017-06-14 MED ORDER — SODIUM CHLORIDE 0.9 % IV SOLN
Freq: Once | INTRAVENOUS | Status: AC
  Administered 2017-06-14: 13:00:00 via INTRAVENOUS

## 2017-06-14 MED ORDER — INSULIN ASPART 100 UNIT/ML ~~LOC~~ SOLN: 0.0000 [IU] | Freq: Every day | SUBCUTANEOUS | Status: DC

## 2017-06-14 MED ORDER — OXYCODONE HCL 5 MG PO TABS: 5.0000 mg | ORAL_TABLET | ORAL | Status: DC | PRN

## 2017-06-14 MED ORDER — ALBUTEROL SULFATE (2.5 MG/3ML) 0.083% IN NEBU: 2.5000 mg | INHALATION_SOLUTION | Freq: Four times a day (QID) | RESPIRATORY_TRACT | Status: DC | PRN

## 2017-06-14 MED ORDER — SODIUM CHLORIDE 0.9 % IV SOLN: Freq: Once | INTRAVENOUS | Status: DC

## 2017-06-14 MED ORDER — LATANOPROST 0.005 % OP SOLN
1.0000 [drp] | Freq: Every day | OPHTHALMIC | Status: DC
  Administered 2017-06-14 – 2017-06-15 (×2): 1 [drp] via OPHTHALMIC
  Filled 2017-06-14: qty 2.5

## 2017-06-14 MED ORDER — BISACODYL 5 MG PO TBEC: 5.0000 mg | DELAYED_RELEASE_TABLET | Freq: Every day | ORAL | Status: DC | PRN

## 2017-06-14 MED ORDER — SODIUM CHLORIDE 0.9% FLUSH
3.0000 mL | Freq: Two times a day (BID) | INTRAVENOUS | Status: DC
  Administered 2017-06-14 – 2017-06-16 (×4): 3 mL via INTRAVENOUS

## 2017-06-14 MED ORDER — METOPROLOL TARTRATE 50 MG PO TABS
50.0000 mg | ORAL_TABLET | Freq: Two times a day (BID) | ORAL | Status: DC
  Administered 2017-06-14 – 2017-06-16 (×4): 50 mg via ORAL
  Filled 2017-06-14 (×4): qty 1

## 2017-06-14 MED ORDER — MELATONIN 3 MG PO TABS
9.0000 mg | ORAL_TABLET | Freq: Every evening | ORAL | Status: DC | PRN
  Administered 2017-06-14: 9 mg via ORAL
  Filled 2017-06-14 (×2): qty 3

## 2017-06-14 MED ORDER — GLIMEPIRIDE 2 MG PO TABS
2.0000 mg | ORAL_TABLET | Freq: Two times a day (BID) | ORAL | Status: DC
  Administered 2017-06-14 – 2017-06-15 (×2): 2 mg via ORAL
  Filled 2017-06-14 (×2): qty 1

## 2017-06-14 MED ORDER — FUROSEMIDE 10 MG/ML IJ SOLN
40.0000 mg | Freq: Two times a day (BID) | INTRAMUSCULAR | Status: DC
  Administered 2017-06-14 – 2017-06-15 (×2): 40 mg via INTRAVENOUS
  Filled 2017-06-14 (×2): qty 4

## 2017-06-14 MED ORDER — ONDANSETRON HCL 4 MG/2ML IJ SOLN: 4.0000 mg | Freq: Four times a day (QID) | INTRAMUSCULAR | Status: DC | PRN

## 2017-06-14 MED ORDER — ACETAMINOPHEN 650 MG RE SUPP: 650.0000 mg | Freq: Four times a day (QID) | RECTAL | Status: DC | PRN

## 2017-06-14 MED ORDER — POLYETHYLENE GLYCOL 3350 17 G PO PACK: 17.0000 g | PACK | Freq: Every day | ORAL | Status: DC | PRN

## 2017-06-14 MED ORDER — INSULIN ASPART 100 UNIT/ML ~~LOC~~ SOLN
0.0000 [IU] | Freq: Three times a day (TID) | SUBCUTANEOUS | Status: DC
  Administered 2017-06-14: 20 [IU] via SUBCUTANEOUS
  Administered 2017-06-15: 15 [IU] via SUBCUTANEOUS
  Administered 2017-06-15: 20 [IU] via SUBCUTANEOUS
  Administered 2017-06-15: 15 [IU] via SUBCUTANEOUS
  Administered 2017-06-16: 11 [IU] via SUBCUTANEOUS
  Administered 2017-06-16: 7 [IU] via SUBCUTANEOUS

## 2017-06-14 MED ORDER — ACETAMINOPHEN 325 MG PO TABS: 650.0000 mg | ORAL_TABLET | Freq: Four times a day (QID) | ORAL | Status: DC | PRN

## 2017-06-14 MED ORDER — HYDRALAZINE HCL 50 MG PO TABS
50.0000 mg | ORAL_TABLET | Freq: Two times a day (BID) | ORAL | Status: DC
  Administered 2017-06-14 – 2017-06-16 (×4): 50 mg via ORAL
  Filled 2017-06-14 (×4): qty 1

## 2017-06-14 MED ORDER — ACETAMINOPHEN 325 MG PO TABS: 650.0000 mg | ORAL_TABLET | Freq: Once | ORAL | Status: DC

## 2017-06-14 MED ORDER — KETOROLAC TROMETHAMINE 15 MG/ML IJ SOLN: 15.0000 mg | Freq: Four times a day (QID) | INTRAMUSCULAR | Status: DC | PRN

## 2017-06-14 MED ORDER — PROCHLORPERAZINE MALEATE 10 MG PO TABS
10.0000 mg | ORAL_TABLET | Freq: Four times a day (QID) | ORAL | Status: DC | PRN
  Filled 2017-06-14: qty 1

## 2017-06-14 MED ORDER — IRBESARTAN 300 MG PO TABS
300.0000 mg | ORAL_TABLET | Freq: Every day | ORAL | Status: DC
  Administered 2017-06-14 – 2017-06-16 (×3): 300 mg via ORAL
  Filled 2017-06-14 (×3): qty 1

## 2017-06-14 MED ORDER — ATORVASTATIN CALCIUM 40 MG PO TABS
40.0000 mg | ORAL_TABLET | Freq: Every day | ORAL | Status: DC
  Administered 2017-06-14 – 2017-06-16 (×3): 40 mg via ORAL
  Filled 2017-06-14 (×3): qty 1

## 2017-06-14 MED ORDER — HYDROCHLOROTHIAZIDE 12.5 MG PO CAPS
12.5000 mg | ORAL_CAPSULE | Freq: Every day | ORAL | Status: DC
  Administered 2017-06-14 – 2017-06-16 (×3): 12.5 mg via ORAL
  Filled 2017-06-14 (×3): qty 1

## 2017-06-14 MED ORDER — DIPHENHYDRAMINE HCL 25 MG PO CAPS: 25.0000 mg | ORAL_CAPSULE | Freq: Once | ORAL | Status: DC

## 2017-06-14 NOTE — H&P (Signed)
History and Physical    Gao Mitnick DXA:[id number] DOB: 14-Dec-1955 DOA: 06/14/2017  PCP: Glendale Chard, MD Patient coming from: Home  I have personally briefly reviewed patient's old medical records in [place]  Chief Complaint: Sent in by primary care physician due to low hemoglobin.  HPI: Patricia Mata is a 61 y.o. 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  She was scheduled to follow-up with Dr. Alen Blew in October but that appointment has not been completed yet.  To complicate matters the patient was recently diagnosed with a severe aortic stenosis requiring evaluation for possibly valve replacement.  He was due to undergo a cardiac catheterization this week that was held due to augers being in the 507 range.  Patient blood glucoses have been very abnormal since she started chemotherapy for her myelodysplastic syndrome.  This is all information from her personal history I am having difficulty ascertaining or not she is receiving steroids with her chemotherapy.  It appears the patient may be getting Azacitidine and occasionally some steroids as well with her infusion due to low platelet counts.  This subsequently makes her blood glucoses go up.  All of this has been complicating the attempts to get a cardiac cath done to evaluate her aortic valve more fully.  Also with the aortic valve being severely stenotic it has increased her risk of having congestive heart failure episodes.  Her increased risk of congestive heart failure coupled with her significant anemia has caused her to require significant blood  transfusions approximately 2 units every 2 weeks.  Patient now presents with a hemoglobin of 5.7 requiring yet another blood transfusion.    Review of Systems: As per HPI otherwise 10 point review of systems negative.   ROS Past Medical History:  Diagnosis Date  . Diabetes mellitus without complication ([place])   . Hypertension   . Macrocytic anemia     Past Surgical History:  Procedure Laterality Date  . COLON SURGERY    . IR FLUORO GUIDE PORT INSERTION RIGHT  04/02/2017  . IR US GUIDE VASC ACCESS RIGHT  04/02/2017     reports that she quit smoking about 16 years ago. Her smoking use included cigarettes. She has quit using smokeless tobacco. She reports that she drinks about 1.2 oz of alcohol per week. Her drug history is not on file.  Allergies  Allergen Reactions  . Ancef [Cefazolin] Itching    Severe itching- after procedure, ancef was the antibiotic.-04/02/17  . Sulfa Antibiotics Rash and Other (See Comments)    blisters    Family History  Problem Relation Age of Onset  . Hypertension Mother   . Heart attack Father   . Hypertension Sister   . Hypertension Brother   . Hypertension Sister   . Hypertension Sister   . Prostate cancer Brother   . HIV/AIDS Brother     Prior to Admission medications   Medication Sig Start Date End Date Taking? Authorizing Provider  albuterol (PROVENTIL HFA;VENTOLIN HFA) 108 (90 Base) MCG/ACT inhaler Inhale 2 puffs into the lungs every 6 (six) hours as needed for wheezing or shortness of breath.   Yes [provider]  atorvastatin (LIPITOR) 40 MG tablet Take 40 mg by mouth daily.   Yes [provider]  Azilsartan-Chlorthalidone (EDARBYCLOR) 40-12.5 MG TABS Take 1 tablet by mouth daily.    Yes [provider]  colchicine 0.6 MG tablet Take 0.6 mg by mouth 2 (two) times daily as needed (gout flare).  11/02/15  Yes [provider]  furosemide (LASIX) 20 MG tablet TAKE 1 TABLET BY MOUTH TWICE A DAY Patient  taking differently: TAKE 20 mg BY MOUTH TWICE A DAY 05/30/17  Yes Shadad, Mathis Dad, MD  glimepiride (AMARYL) 2 MG tablet Take 2 mg by mouth 2 (two) times daily.  10/26/15  Yes [provider]  hydrALAZINE (APRESOLINE) 25 MG tablet Take 50 mg by mouth 2 (two) times daily with a meal.    Yes [provider]  latanoprost (XALATAN) 0.005 % ophthalmic solution Place 1 drop into both eyes at bedtime.    Yes [provider]  lidocaine-prilocaine (EMLA) cream Apply a quarter size of cream to port 1-2 hours prior to access. Cover with saran wrap. 04/17/17  Yes [name], MD  Melatonin 10 MG CAPS Take 10 mg by mouth at bedtime as needed (sleep).   Yes [provider]  metoprolol (LOPRESSOR) 50 MG tablet Take 50 mg by mouth 2 (two) times daily.  11/28/15  Yes [provider]  sitaGLIPtin-metformin (JANUMET) 50-1000 MG tablet Take 1 tablet by mouth 2 (two) times daily with a meal.   Yes [provider]  prochlorperazine (COMPAZINE) 10 MG tablet Take 1 tablet (10 mg total) by mouth every 6 (six) hours as needed for nausea or vomiting. 04/28/17   [name], MD    Physical Exam: Vitals:   06/14/17 1455 06/14/17 1500 06/14/17 1515 06/14/17 1559  BP:  (!) 179/63    Pulse:  86    Resp:  14    Temp: 97.9 F (36.6 C)  97.9 F (36.6 C)   TempSrc: Oral  Oral   SpO2:  100%    Weight:    113.1 kg (249 lb 5.4 oz)  Height:    5\' 4"  (1.626 m)    Constitutional: NAD, calm, comfortable Vitals:   06/14/17 1455 06/14/17 1500 06/14/17 1515 06/14/17 1559  BP:  (!) 179/63    Pulse:  86    Resp:  14    Temp: 97.9 F (36.6 C)  97.9 F (36.6 C)   TempSrc: Oral  Oral   SpO2:  100%    Weight:    113.1 kg (249 lb 5.4 oz)  Height:    5\' 4"  (1.626 m)   Eyes: PERRL, lids and conjunctivae normal ENMT: Mucous membranes are moist. Posterior pharynx clear of any exudate or lesions.Normal dentition.  Neck: normal, supple, no masses, no thyromegaly Respiratory:  clear to auscultation bilaterally, no wheezing, crackles bilaterally at bases. Normal respiratory effort. No accessory muscle use.  Cardiovascular: Regular rate and rhythm, no murmurs / rubs / gallops. No extremity edema. 2+ pedal pulses. No carotid bruits.  2+ edema which patient reports is improved of the lower extremities Abdomen: no tenderness, no masses palpated. No hepatosplenomegaly. Bowel sounds positive.  Musculoskeletal: no clubbing / cyanosis. No joint deformity upper and lower extremities. Good ROM, no contractures. Normal muscle tone.  Skin: no rashes, lesions, ulcers. No induration Neurologic: CN 2-12 grossly intact. Sensation intact, DTR normal. Strength 5/5 in all 4.  Psychiatric: Normal judgment and insight. Alert and oriented x 3. Normal mood.  Labs on Admission: I have personally reviewed following labs and imaging studies  CBC: Recent Labs  Lab 06/10/17 1028 06/14/17 1123 06/14/17 1311  WBC 3.1* 1.7* 1.7*  NEUTROABS 2.0  --  1.2*  HGB 6.3* 5.7* 6.2*  HCT 19.0* 16.5* 18.1*  MCV 101* 98.2 96.8  PLT 251 277 284   Basic Metabolic Panel: Recent Labs  Lab 06/10/17 1028 06/14/17 1123  NA 132* 128*  K 4.7 4.2  CL 89* 92*  CO2 22 24  GLUCOSE 506* 456*  BUN 23 27*  CREATININE 1.18* 1.44*  CALCIUM 9.0 8.6*   GFR: Estimated Creatinine Clearance: 49.9 mL/min (A) (by C-G formula based on SCr of 1.44 mg/dL (H)). Liver Function Tests: Recent Labs  Lab 06/14/17 1123  AST 35  ALT 26  ALKPHOS 164*  BILITOT 3.2*  PROT 6.7  ALBUMIN 3.0*   No results for input(s): LIPASE, AMYLASE in the last 168 hours. No results for input(s): AMMONIA in the last 168 hours. Coagulation Profile: Recent Labs  Lab 06/10/17 1028  INR 1.3*   Cardiac Enzymes: No results for input(s): CKTOTAL, CKMB, CKMBINDEX, TROPONINI in the last 168 hours. BNP (last 3 results) No results for input(s): PROBNP in the last 8760 hours. HbA1C: No results for input(s): HGBA1C in the last 72  hours. CBG: No results for input(s): GLUCAP in the last 168 hours. Lipid Profile: No results for input(s): CHOL, HDL, LDLCALC, TRIG, CHOLHDL, LDLDIRECT in the last 72 hours. Thyroid Function Tests: No results for input(s): TSH, T4TOTAL, FREET4, T3FREE, THYROIDAB in the last 72 hours. Anemia Panel: No results for input(s): VITAMINB12, FOLATE, FERRITIN, TIBC, IRON, RETICCTPCT in the last 72 hours. Urine analysis:    Component Value Date/Time   COLORURINE AMBER (A) 05/26/2016 1336   APPEARANCEUR CLOUDY (A) 05/26/2016 1336   LABSPEC 1.015 05/26/2016 1336   PHURINE 5.5 05/26/2016 1336   GLUCOSEU NEGATIVE 05/26/2016 1336   HGBUR NEGATIVE 05/26/2016 1336   BILIRUBINUR NEGATIVE 05/26/2016 1336   KETONESUR NEGATIVE 05/26/2016 1336   PROTEINUR 30 (A) 05/26/2016 1336   NITRITE NEGATIVE 05/26/2016 1336   LEUKOCYTESUR MODERATE (A) 05/26/2016 1336    Radiological Exams on Admission: Dg Chest Port 1 View  Result Date: 06/14/2017 CLINICAL DATA:  Dyspnea. EXAM: PORTABLE CHEST 1 VIEW COMPARISON:  Chest x-ray dated June 10, 2017. FINDINGS: Unchanged positioning of right-sided port catheter with the tip in the upper SVC. Stable moderate cardiomegaly. Mild pulmonary vascular congestion. No focal consolidation, pleural effusion, or pneumothorax. No acute osseous abnormality. IMPRESSION: Stable moderate cardiomegaly and mild pulmonary vascular congestion. Electronically Signed   By: Titus Dubin M.D.   On: 06/14/2017 13:18      Assessment/Plan Principal Problem:   Symptomatic anemia Active Problems:   Severe aortic stenosis   MDS (myelodysplastic syndrome) (HCC)   Essential hypertension   Carotid artery disease ([place])    1.  Symptomatic anemia: Patient will require transfusion of blood to get a hemoglobin greater than 10.  She is due to receive her second unit soon and I have requested that she receive another unit of blood as her hemoglobin only came up to 6 after the first unit of  blood.  2.  Severe aortic stenosis: This complicates her entire picture due to the fact that she is getting into congestive heart failure both from the severe aortic stenosis and from the symptomatic anemia.  We are attempting evaluation with cardiac cath prior to planned aortic valve revision either through  A TAVR or through traditional means.  She will follow-up with Dr. Alvester Chou.  3.  Myelodysplastic syndrome: Patient currently receiving A'scytidine as well as occasional steroids due to low platelet counts.  Has screwed up her diabetes management and her sugars have been very elevated.  She will continue follow-up with Dr. Alen Blew as an outpatient.  4.  Essential hypertension continue home medication management.  5.  Coronary artery disease: Patient scheduled for a cardiac cath which had to be rescheduled due to elevated blood glucoses  Given patient's complicated anemia as well as complicated blood glucose status I believe patient should be admitted to the hospital the evening before her cardiac catheterization for stabilization of both her anemia and possibly elevated blood glucoses.  I think that this is the best method in order to really assess her aortic valve prior to planned treatment.  DVT prophylaxis: Lovenox Code Status: Full Family Communication: Spoke with patient's 2 visitors I believe her husband and her sister were present on evaluation all questions answered Disposition Plan: Likely home when hemoglobin greater than 10 Consults called: None Admission status: Observation   Lady Deutscher MD [facility] Pager [phone] If 7PM-7AM, please contact night-coverage [email or web address] Password [email or web address]  06/14/2017, 4:40 PM

## 2017-06-14 NOTE — ED Notes (Signed)
Transfusion stopped at 1455

## 2017-06-14 NOTE — ED Provider Notes (Signed)
[facility] EMERGENCY DEPARTMENT Provider Note   CSN: [id number] Arrival date & time: 06/14/17  1116     History   Chief Complaint Chief Complaint  Patient presents with  . Abnormal Lab    HPI Patricia Mata is a 61 y.o. female.  HPI  61 year old female history of colon cancer 10 years ago, diabetes, hypertension, with myelodysplastic syndrome presents today with increasing dyspnea and known anemia.  She had her hemoglobin checked on Tuesday and again yesterday.  She was told to come to the ED due to significant anemia.  She was also noted to be hyperglycemic.  She reports symptoms of decreased exercise tolerance and increasing dyspnea.  She denies any chest pain although she has been scheduled for cardiology intervention for severe aortic stenosis which was canceled due to her hyperglycemia.  She denies any headache, head injury, visual changes, chest pain, nausea, vomiting, or diarrhea.  She does endorse lower extremity edema which she reports has been improving after receiving diuretics from her primary care doctor.  Past Medical History:  Diagnosis Date  . Diabetes mellitus without complication ([place])   . Hypertension   . Macrocytic anemia    She was diagnosed with myelodysplastic syndrome in July 2017 as workup for progressive macrocytic anemia. She was noted to have anemia in the 10-12 range from 2010-2013 with no clear cause such as Vit B12 or folate deficiency. She was then referred to Dr. Alen Blew with Hematology Oncology at [facility] in mid 2017 when Hg was noted to drop to the 7 range with MCV in 110s. Bone marrow biopsy obtained on 02/01/2016 showed hypercellular marrow with the dyspoietic changes. The overall finding of the bone marrow was consistent with myelodysplasia and morphologically looked like refractory cytopenia with multilineage dysplasia. Per report, her cytogenetics did not reveal any specific abnormalities. Significant blasts were not noted. Her  IPSS-R score was calculated and based on her characteristics she has a score of 2.5. She continues to follow with Dr. Alen Blew and is currently on Aranesp 300 g every 2 weeks with periodic red cell transfusions (~1/month). She was referred today to discuss options for therapy as well as discuss transplant candidacy.    Patient Active Problem List   Diagnosis Date Noted  . Severe aortic stenosis 06/10/2017  . Carotid artery disease ([place]) 06/10/2017  . Essential hypertension 05/13/2017  . Hyperlipidemia 05/13/2017  . Bilateral lower extremity edema 05/13/2017  . Port-A-Cath in place 04/28/2017  . MDS (myelodysplastic syndrome) ([place]) 03/20/2017  . Goals of care, counseling/discussion 03/20/2017  . Anemia in chronic kidney disease 05/10/2016  . Anemia in stage 1 chronic kidney disease 05/10/2016  . Anemia 02/09/2016  . 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 COLON SURGERY    . IR FLUORO GUIDE PORT INSERTION RIGHT  04/02/2017  . IR US GUIDE VASC ACCESS RIGHT  04/02/2017    OB History    No data available       Home Medications    Prior to Admission medications   Medication Sig Start Date End Date Taking? Authorizing Provider  atorvastatin (LIPITOR) 40 MG tablet Take 40 mg by mouth daily.    [provider]  Azilsartan-Chlorthalidone (EDARBYCLOR) 40-12.5 MG TABS Take 1 tablet by mouth daily.     [provider]  colchicine 0.6 MG tablet Take 0.6 mg by mouth 2 (two) times daily as needed (gout flare).  11/02/15   [provider]  furosemide (LASIX) 20 MG tablet TAKE 1 TABLET BY MOUTH TWICE A DAY 05/30/17   [name], MD  glimepiride (AMARYL) 2 MG tablet Take 2 mg by mouth 2 (two) times daily.  10/26/15   [provider]  hydrALAZINE (APRESOLINE) 25 MG tablet Take 50 mg by mouth 2 (two) times daily with a meal.     [provider]  JANUMET XR  50-500 MG TB24 Take 1 tablet by mouth daily. 03/13/17   [provider]  latanoprost (XALATAN) 0.005 % ophthalmic solution Place 1 drop into both eyes at bedtime.     [provider]  lidocaine-prilocaine (EMLA) cream Apply a quarter size of cream to port 1-2 hours prior to access. Cover with saran wrap. 04/17/17   [name], MD  Melatonin 10 MG CAPS Take 10 mg by mouth at bedtime as needed (sleep).    [provider]  metoprolol (LOPRESSOR) 50 MG tablet Take 50 mg by mouth 2 (two) times daily.  11/28/15   [provider]  prochlorperazine (COMPAZINE) 10 MG tablet Take 1 tablet (10 mg total) by mouth every 6 (six) hours as needed for nausea or vomiting. 04/28/17   [name], MD    Family History Family History  Problem Relation Age of Onset  . Hypertension Mother   . Heart attack Father   . Hypertension Sister   . Hypertension Brother   . Hypertension Sister   . Hypertension Sister   . Prostate cancer Brother   . HIV/AIDS Brother     Social History Social History   Tobacco Use  . Smoking status: Former Smoker    Types: Cigarettes    Last attempt to quit: 01/31/2001    Years since quitting: 16.3  . Smokeless tobacco: Former Network engineer Use Topics  . Alcohol use: Yes    Alcohol/week: 1.2 oz    Types: 2 Shots of liquor per week  . Drug use: Not on file     Allergies   Ancef [cefazolin] and Sulfa antibiotics   Review of Systems Review of Systems  All other systems reviewed and are negative.    Physical Exam Updated Vital Signs BP (!) 164/65   Pulse 91   Temp 99.4 F (37.4 C) (Oral)   Resp 18   SpO2 100%   Physical Exam  Constitutional: She is oriented to person, place, and time. She appears well-developed and well-nourished.  HENT:  Head: Normocephalic and atraumatic.  Right Ear: External ear normal.  Left Ear: External ear normal.  Eyes: EOM are normal. Pupils are equal, round, and reactive to light.  Neck:  Normal range of motion. Neck supple.  Cardiovascular: Normal rate and regular rhythm.  Pulmonary/Chest: Effort normal and breath sounds normal.  Abdominal: Soft. Bowel sounds are normal.  Musculoskeletal: Normal range of motion.  Neurological: She is alert and oriented to person, place, and time.  Skin: Skin is warm and dry. Capillary refill takes less than 2 seconds.  Psychiatric: She has a normal mood and affect.  Nursing note and vitals reviewed.    ED Treatments / Results  Labs (all labs ordered are listed, but only abnormal results are displayed) Labs Reviewed  COMPREHENSIVE METABOLIC PANEL - Abnormal; Notable for the following components:      Result Value   Sodium 128 (*)    Chloride 92 (*)    Glucose, Bld 456 (*)    BUN 27 (*)  Creatinine, Ser 1.44 (*)    Calcium 8.6 (*)    Albumin 3.0 (*)    Alkaline Phosphatase 164 (*)    Total Bilirubin 3.2 (*)    GFR calc non Af Amer 38 (*)    GFR calc Af Amer 44 (*)    All other components within normal limits  CBC - Abnormal; Notable for the following components:   WBC 1.7 (*)    RBC 1.68 (*)    Hemoglobin 5.7 (*)    HCT 16.5 (*)    RDW 27.7 (*)    All other components within normal limits  CBC WITH DIFFERENTIAL/PLATELET  POC OCCULT BLOOD, ED  TYPE AND SCREEN  PREPARE RBC (CROSSMATCH)    EKG  EKG Interpretation None       Radiology No results found.  Procedures Procedures (including critical care time)  Medications Ordered in ED Medications  0.9 %  sodium chloride infusion (not administered)     Initial Impression / Assessment and Plan / ED Course  I have reviewed the triage vital signs and the nursing notes.  Pertinent labs & imaging results that were available during my care of the patient were reviewed by me and considered in my medical decision making (see chart for details).    1-symptomatic anemia history of myelodysplastic syndrome 2 hyperglycemia 3 hyponatremia secondary to #2 4-  aortic  stenosis 5 dyspnea Plan admission for transfusion and observation as patient has volume overloaded and may require some diuresis.  Vitals:   06/14/17 1315 06/14/17 1336  BP: (!) 154/48   Pulse: 86   Resp: 16   Temp:  97.7 F (36.5 C)  SpO2: 99%     Discussed with Dr. Evangeline Gula and she will see for admission  Final Clinical Impressions(s) / ED Diagnoses   Final diagnoses:  Symptomatic anemia    ED Discharge Orders    None       [name], MD 06/14/17 1357

## 2017-06-14 NOTE — ED Notes (Signed)
Husband Ron- [place]- [phone]

## 2017-06-14 NOTE — ED Triage Notes (Signed)
Pt states she was sent here by her doctor for low hemoglobin. Pt is a former cancer patient and has required transfusions in the past. Pt alert and oriented. Does appear pale. HGB was 6.3 on 06/10/17

## 2017-06-15 DIAGNOSIS — D649 Anemia, unspecified: Secondary | ICD-10-CM

## 2017-06-15 DIAGNOSIS — Z881 Allergy status to other antibiotic agents status: Secondary | ICD-10-CM | POA: Diagnosis not present

## 2017-06-15 DIAGNOSIS — I10 Essential (primary) hypertension: Secondary | ICD-10-CM | POA: Diagnosis not present

## 2017-06-15 DIAGNOSIS — I350 Nonrheumatic aortic (valve) stenosis: Secondary | ICD-10-CM

## 2017-06-15 DIAGNOSIS — R739 Hyperglycemia, unspecified: Secondary | ICD-10-CM

## 2017-06-15 DIAGNOSIS — Z882 Allergy status to sulfonamides status: Secondary | ICD-10-CM | POA: Diagnosis not present

## 2017-06-15 DIAGNOSIS — I2510 Atherosclerotic heart disease of native coronary artery without angina pectoris: Secondary | ICD-10-CM | POA: Diagnosis present

## 2017-06-15 DIAGNOSIS — Z87891 Personal history of nicotine dependence: Secondary | ICD-10-CM | POA: Diagnosis not present

## 2017-06-15 DIAGNOSIS — Z7984 Long term (current) use of oral hypoglycemic drugs: Secondary | ICD-10-CM | POA: Diagnosis not present

## 2017-06-15 DIAGNOSIS — N183 Chronic kidney disease, stage 3 (moderate): Secondary | ICD-10-CM | POA: Diagnosis present

## 2017-06-15 DIAGNOSIS — Z8042 Family history of malignant neoplasm of prostate: Secondary | ICD-10-CM | POA: Diagnosis not present

## 2017-06-15 DIAGNOSIS — E1122 Type 2 diabetes mellitus with diabetic chronic kidney disease: Secondary | ICD-10-CM | POA: Diagnosis present

## 2017-06-15 DIAGNOSIS — D46A Refractory cytopenia with multilineage dysplasia: Secondary | ICD-10-CM | POA: Diagnosis present

## 2017-06-15 DIAGNOSIS — I129 Hypertensive chronic kidney disease with stage 1 through stage 4 chronic kidney disease, or unspecified chronic kidney disease: Secondary | ICD-10-CM | POA: Diagnosis present

## 2017-06-15 DIAGNOSIS — Z8249 Family history of ischemic heart disease and other diseases of the circulatory system: Secondary | ICD-10-CM | POA: Diagnosis not present

## 2017-06-15 DIAGNOSIS — E1165 Type 2 diabetes mellitus with hyperglycemia: Secondary | ICD-10-CM | POA: Diagnosis present

## 2017-06-15 DIAGNOSIS — Z85038 Personal history of other malignant neoplasm of large intestine: Secondary | ICD-10-CM | POA: Diagnosis not present

## 2017-06-15 DIAGNOSIS — E876 Hypokalemia: Secondary | ICD-10-CM | POA: Diagnosis present

## 2017-06-15 LAB — BASIC METABOLIC PANEL
Anion gap: 9 (ref 5–15)
BUN: 26 mg/dL — ABNORMAL HIGH (ref 6–20)
CO2: 26 mmol/L (ref 22–32)
Calcium: 8.5 mg/dL — ABNORMAL LOW (ref 8.9–10.3)
Chloride: 94 mmol/L — ABNORMAL LOW (ref 101–111)
Creatinine, Ser: 1.19 mg/dL — ABNORMAL HIGH (ref 0.44–1.00)
GFR calc Af Amer: 56 mL/min — ABNORMAL LOW (ref >60)
GFR calc non Af Amer: 48 mL/min — ABNORMAL LOW (ref >60)
Glucose, Bld: 380 mg/dL — ABNORMAL HIGH (ref 65–99)
Potassium: 3.3 mmol/L — ABNORMAL LOW (ref 3.5–5.1)
Sodium: 129 mmol/L — ABNORMAL LOW (ref 135–145)

## 2017-06-15 LAB — HEMOGLOBIN A1C
Hgb A1c MFr Bld: 11.6 % — ABNORMAL HIGH (ref 4.8–5.6)
Mean Plasma Glucose: 286 mg/dL

## 2017-06-15 LAB — HIV ANTIBODY (ROUTINE TESTING W REFLEX): HIV Screen 4th Generation wRfx: NONREACTIVE

## 2017-06-15 LAB — TYPE AND SCREEN
ABO/RH(D): A NEG
Antibody Screen: NEGATIVE
Unit division: 0
Unit division: 0
Unit division: 0

## 2017-06-15 LAB — CBC
HCT: 25.1 % — ABNORMAL LOW (ref 36.0–46.0)
Hemoglobin: 8.8 g/dL — ABNORMAL LOW (ref 12.0–15.0)
MCH: 31.7 pg (ref 26.0–34.0)
MCHC: 35.1 g/dL (ref 30.0–36.0)
MCV: 90.3 fL (ref 78.0–100.0)
Platelets: 269 10*3/uL (ref 150–400)
RBC: 2.78 MIL/uL — ABNORMAL LOW (ref 3.87–5.11)
RDW: 22.4 % — ABNORMAL HIGH (ref 11.5–15.5)
WBC: 2.6 10*3/uL — ABNORMAL LOW (ref 4.0–10.5)

## 2017-06-15 LAB — BPAM RBC
Blood Product Expiration Date: 201812172359
Blood Product Expiration Date: 201812172359
Blood Product Expiration Date: 201812182359
ISSUE DATE / TIME: 201812011321
ISSUE DATE / TIME: 201812011651
ISSUE DATE / TIME: 201812012332
Unit Type and Rh: 600
Unit Type and Rh: 600
Unit Type and Rh: 600

## 2017-06-15 LAB — GLUCOSE, CAPILLARY
Glucose-Capillary: 308 mg/dL — ABNORMAL HIGH (ref 65–99)
Glucose-Capillary: 381 mg/dL — ABNORMAL HIGH (ref 65–99)
Glucose-Capillary: 307 mg/dL — ABNORMAL HIGH (ref 65–99)
Glucose-Capillary: 175 mg/dL — ABNORMAL HIGH (ref 65–99)

## 2017-06-15 MED ORDER — FUROSEMIDE 20 MG PO TABS
20.0000 mg | ORAL_TABLET | Freq: Two times a day (BID) | ORAL | Status: DC
  Administered 2017-06-15 – 2017-06-16 (×2): 20 mg via ORAL
  Filled 2017-06-15 (×2): qty 1

## 2017-06-15 MED ORDER — TRAZODONE HCL 50 MG PO TABS
50.0000 mg | ORAL_TABLET | Freq: Once | ORAL | Status: AC
Start: 2017-06-15 — End: 2017-06-15
  Administered 2017-06-15: 50 mg via ORAL
  Filled 2017-06-15: qty 1

## 2017-06-15 MED ORDER — DIPHENHYDRAMINE HCL 25 MG PO CAPS
25.0000 mg | ORAL_CAPSULE | Freq: Once | ORAL | Status: AC
  Administered 2017-06-15: 25 mg via ORAL
  Filled 2017-06-15: qty 1

## 2017-06-15 MED ORDER — POTASSIUM CHLORIDE CRYS ER 20 MEQ PO TBCR
40.0000 meq | EXTENDED_RELEASE_TABLET | Freq: Once | ORAL | Status: AC
  Administered 2017-06-15: 40 meq via ORAL
  Filled 2017-06-15: qty 2

## 2017-06-15 MED ORDER — INSULIN GLARGINE 100 UNIT/ML ~~LOC~~ SOLN
10.0000 [IU] | Freq: Every day | SUBCUTANEOUS | Status: DC
  Administered 2017-06-15 – 2017-06-16 (×2): 10 [IU] via SUBCUTANEOUS
  Filled 2017-06-15 (×2): qty 0.1

## 2017-06-15 MED ORDER — SODIUM CHLORIDE 0.9% FLUSH: 10.0000 mL | INTRAVENOUS | Status: DC | PRN

## 2017-06-15 NOTE — Progress Notes (Signed)
[facility] PROGRESS NOTE  Patricia Mata TDD:[id number] DOB: [birth year] DOA: 06/14/2017  PCP: Glendale Chard, MD  Brief History/Interval Summary: 61 year old female with complicated past medical history including colon cancer diagnosed in 2008 which is in remission, myelodysplastic syndrome currently on Azacitidine and followed by hematology/oncology (Dr. Alen Blew), who has been transfused multiple times previously.  Also has a history of severe aortic stenosis followed by cardiology and is being evaluated for aortic valve replacement.  She was sent in by her primary care physician due to severe anemia with a hemoglobin of 5.7.  She was hospitalized for further management.  Reason for Visit: Severe anemia.  Hyperglycemia.  Consultants: None  Procedures: Blood transfusion  Antibiotics: None  Subjective/Interval History: Patient states that she is feeling better after receiving blood transfusion.  She is very concerned about her high glucose levels.  Denies any chest pain or shortness of breath.  Denies any bleeding.  ROS: Denies any nausea or vomiting  Objective:  Vital Signs  Vitals:   06/14/17 2359 06/15/17 0235 06/15/17 0543 06/15/17 0918  BP: (!) 134/50 (!) 156/81 (!) 145/72 (!) 169/59  Pulse: 80 81 78 82  Resp: 18 18 17 18   Temp: 99.1 F (37.3 C) 99 F (37.2 C) 99.1 F (37.3 C) 98.5 F (36.9 C)  TempSrc: Oral Oral Oral Oral  SpO2: 100%  100% 100%  Weight:      Height:        Intake/Output Summary (Last 24 hours) at 06/15/2017 1430 Last data filed at 06/15/2017 1000 Gross per 24 hour  Intake 2532 ml  Output 1375 ml  Net 1157 ml   Filed Weights   06/14/17 1340 06/14/17 1559  Weight: 108.9 kg (240 lb) 113.1 kg (249 lb 5.4 oz)    General appearance: alert, cooperative, appears stated age and no distress Resp: clear to auscultation bilaterally Cardio: S1, S2 normal, no S3 or S4, systolic murmur: late systolic 4/6, crescendo at 2nd right intercostal space, no  click and no rub GI: soft, non-tender; bowel sounds normal; no masses,  no organomegaly Extremities: extremities normal, atraumatic, no cyanosis or edema Neurologic: No obvious focal neurological deficits.  Lab Results:  Data Reviewed: I have personally reviewed following labs and imaging studies  CBC: Recent Labs  Lab 06/10/17 1028 06/14/17 1123 06/14/17 1311 06/14/17 2122 06/15/17 0500  WBC 3.1* 1.7* 1.7*  --  2.6*  NEUTROABS 2.0  --  1.2*  --   --   HGB 6.3* 5.7* 6.2* 8.0* 8.8*  HCT 19.0* 16.5* 18.1* 23.2* 25.1*  MCV 101* 98.2 96.8  --  90.3  PLT 251 277 283  --  623    Basic Metabolic Panel: Recent Labs  Lab 06/10/17 1028 06/14/17 1123 06/15/17 0500  NA 132* 128* 129*  K 4.7 4.2 3.3*  CL 89* 92* 94*  CO2 22 24 26   GLUCOSE 506* 456* 380*  BUN 23 27* 26*  CREATININE 1.18* 1.44* 1.19*  CALCIUM 9.0 8.6* 8.5*    GFR: Estimated Creatinine Clearance: 60.4 mL/min (A) (by C-G formula based on SCr of 1.19 mg/dL (H)).  Liver Function Tests: Recent Labs  Lab 06/14/17 1123  AST 35  ALT 26  ALKPHOS 164*  BILITOT 3.2*  PROT 6.7  ALBUMIN 3.0*    Coagulation Profile: Recent Labs  Lab 06/10/17 1028  INR 1.3*    HbA1C: Recent Labs    06/14/17 1311  HGBA1C 11.6*    CBG: Recent Labs  Lab 06/14/17 1601 06/14/17 2039 06/15/17 0741  06/15/17 [place]     Radiology Studies: Dg Chest Port 1 View  Result Date: 06/14/2017 CLINICAL DATA:  Dyspnea. EXAM: PORTABLE CHEST 1 VIEW COMPARISON:  Chest x-ray dated June 10, 2017. FINDINGS: Unchanged positioning of right-sided port catheter with the tip in the upper SVC. Stable moderate cardiomegaly. Mild pulmonary vascular congestion. No focal consolidation, pleural effusion, or pneumothorax. No acute osseous abnormality. IMPRESSION: Stable moderate cardiomegaly and mild pulmonary vascular congestion. Electronically Signed   By: Titus Dubin M.D.   On: 06/14/2017 13:18      Medications:  Scheduled: . acetaminophen  650 mg Oral Once  . atorvastatin  40 mg Oral Daily  . furosemide  20 mg Oral BID  . hydrALAZINE  50 mg Oral BID WC  . irbesartan  300 mg Oral Daily   And  . hydrochlorothiazide  12.5 mg Oral Daily  . insulin aspart  0-20 Units Subcutaneous TID WC  . insulin aspart  0-5 Units Subcutaneous QHS  . insulin glargine  10 Units Subcutaneous Daily  . latanoprost  1 drop Both Eyes QHS  . metoprolol tartrate  50 mg Oral BID  . sodium chloride flush  3 mL Intravenous Q12H   Continuous:  NHA:FBXUXYBFXOVAN **OR** acetaminophen, albuterol, bisacodyl, ketorolac, Melatonin, ondansetron **OR** ondansetron (ZOFRAN) IV, oxyCODONE, polyethylene glycol, prochlorperazine, sodium chloride flush  Assessment/Plan:  Principal Problem:   Symptomatic anemia Active Problems:   MDS (myelodysplastic syndrome) (HCC)   Essential hypertension   Severe aortic stenosis   Carotid artery disease (HCC)    Symptomatic anemia in the setting of myelodysplastic syndrome Patient has had multiple blood transfusion during the last few weeks.  She is being treated for her MDS by hematology/oncology with Azacitinide.  Hemoglobin improved to 8.8.  Hold off on further transfusion to avoid significant fluid overload.  Outpatient follow-up with oncology.  Hyperglycemia in the setting of type 2 diabetes Patient has known diagnosis of diabetes and is on oral agents.  Over the past few weeks she has noticed high glucose levels.  Reason for this is not entirely clear.  She has not been on steroids recently.  She mentions that she has been compliant with her medications.  Her HbA1c is elevated at 11.6.  I do not think just oral medications will be enough for this patient.  She will need insulin.  Discussed in detail with her.  She is reluctant however understands the need for this.  She will be placed on Lantus 10 units for now.  May need further adjustment based on glucose levels.   Recent cardiac catheterization was canceled due to hyperglycemia so it is even more important for her diabetes to be well controlled.  Severe aortic stenosis Followed by cardiology.  Plan is for left and right heart catheterization.  This was scheduled recently however was canceled due to hyperglycemia.  She currently is asymptomatic.  Continue her home medications.  Change Lasix to oral.  Chronic kidney disease stage III/hypokalemia Creatinine appears to be close to baseline.  Monitor urine output.  Replace potassium.  History of essential hypertension Blood pressure not optimally controlled.  Continue home medications.  Continue to monitor.  History of coronary artery disease Stable.  Continue to monitor for now.   DVT Prophylaxis: SCDs    Code Status: Full code Family Communication: Discussed with the patient Disposition Plan: Management as outlined above.    LOS: 0 days   Bonnielee Haff  [facility] Pager [phone] 06/15/2017, 2:30 PM  If 7PM-7AM, please contact night-coverage at [email or web address], password [email or web address]   

## 2017-06-15 NOTE — Progress Notes (Signed)
Patient with the help of instructions is able to administer insulin.  Gave a copy of information regarding insulin administration. She did administer insulin at lunch and dinner, feeling more confident.  Reinforced the information and also told her to ask if she has any questions, verbalized understanding.

## 2017-06-16 LAB — BASIC METABOLIC PANEL
Anion gap: 7 (ref 5–15)
BUN: 24 mg/dL — ABNORMAL HIGH (ref 6–20)
CO2: 30 mmol/L (ref 22–32)
Calcium: 8.7 mg/dL — ABNORMAL LOW (ref 8.9–10.3)
Chloride: 96 mmol/L — ABNORMAL LOW (ref 101–111)
Creatinine, Ser: 1.04 mg/dL — ABNORMAL HIGH (ref 0.44–1.00)
GFR calc Af Amer: >60 mL/min (ref >60)
GFR calc non Af Amer: 56 mL/min — ABNORMAL LOW (ref >60)
Glucose, Bld: 208 mg/dL — ABNORMAL HIGH (ref 65–99)
Potassium: 3.5 mmol/L (ref 3.5–5.1)
Sodium: 133 mmol/L — ABNORMAL LOW (ref 135–145)

## 2017-06-16 LAB — CBC
HCT: 24.5 % — ABNORMAL LOW (ref 36.0–46.0)
Hemoglobin: 8.5 g/dL — ABNORMAL LOW (ref 12.0–15.0)
MCH: 31.7 pg (ref 26.0–34.0)
MCHC: 34.7 g/dL (ref 30.0–36.0)
MCV: 91.4 fL (ref 78.0–100.0)
Platelets: 271 10*3/uL (ref 150–400)
RBC: 2.68 MIL/uL — ABNORMAL LOW (ref 3.87–5.11)
RDW: 22.8 % — ABNORMAL HIGH (ref 11.5–15.5)
WBC: 2.5 10*3/uL — ABNORMAL LOW (ref 4.0–10.5)

## 2017-06-16 LAB — GLUCOSE, CAPILLARY
Glucose-Capillary: 217 mg/dL — ABNORMAL HIGH (ref 65–99)
Glucose-Capillary: 299 mg/dL — ABNORMAL HIGH (ref 65–99)

## 2017-06-16 MED ORDER — HEPARIN SOD (PORK) LOCK FLUSH 100 UNIT/ML IV SOLN
500.0000 [IU] | INTRAVENOUS | Status: AC | PRN
  Administered 2017-06-16: 500 [IU]

## 2017-06-16 MED ORDER — INSULIN PEN NEEDLE 29G X 5MM MISC: 0 refills | Status: AC

## 2017-06-16 MED ORDER — INSULIN GLARGINE 100 UNIT/ML ~~LOC~~ SOLN
4.0000 [IU] | Freq: Once | SUBCUTANEOUS | Status: AC
  Administered 2017-06-16: 4 [IU] via SUBCUTANEOUS
  Filled 2017-06-16 (×2): qty 0.04

## 2017-06-16 MED ORDER — INSULIN STARTER KIT- PEN NEEDLES (ENGLISH)
1.0000 | Freq: Once | Status: AC
  Administered 2017-06-16: 1
  Filled 2017-06-16 (×2): qty 1

## 2017-06-16 MED ORDER — INSULIN GLARGINE 100 UNITS/ML SOLOSTAR PEN: 14.0000 [IU] | PEN_INJECTOR | Freq: Every day | SUBCUTANEOUS | 11 refills | Status: DC

## 2017-06-16 MED ORDER — INSULIN GLARGINE 100 UNIT/ML ~~LOC~~ SOLN: 14.0000 [IU] | Freq: Every day | SUBCUTANEOUS | Status: DC

## 2017-06-16 NOTE — Discharge Instructions (Signed)
Hyperglycemia Hyperglycemia is when the sugar (glucose) level in your blood is too high. It may not cause symptoms. If you do have symptoms, they may include warning signs, such as:  Feeling more thirsty than normal.  Hunger.  Feeling tired.  Needing to pee (urinate) more than normal.  Blurry eyesight (vision). You may get other symptoms as it gets worse, such as:  Dry mouth.  Not being hungry (loss of appetite).  Fruity-smelling breath.  Weakness.  Weight gain or loss that is not planned. Weight loss may be fast.  A tingling or numb feeling in your hands or feet.  Headache.  Skin that does not bounce back quickly when it is lightly pinched and released (poor skin turgor).  Pain in your belly (abdomen).  Cuts or bruises that heal slowly. High blood sugar can happen to people who do or do not have diabetes. High blood sugar can happen slowly or quickly, and it can be an emergency. Follow these instructions at home: General instructions  Take over-the-counter and prescription medicines only as told by your doctor.  Do not use products that contain nicotine or tobacco, such as cigarettes and e-cigarettes. If you need help quitting, ask your doctor.  Limit alcohol intake to no more than 1 drink per day for nonpregnant women and 2 drinks per day for men. One drink equals 12 oz of beer, 5 oz of wine, or 1 oz of hard liquor.  Manage stress. If you need help with this, ask your doctor.  Keep all follow-up visits as told by your doctor. This is important. Eating and drinking  Stay at a healthy weight.  Exercise regularly, as told by your doctor.  Drink enough fluid, especially when you:  Exercise.  Get sick.  Are in hot temperatures.  Eat healthy foods, such as:  Low-fat (lean) proteins.  Complex carbs (complex carbohydrates), such as whole wheat bread or brown rice.  Fresh fruits and vegetables.  Low-fat dairy products.  Healthy fats.  Drink enough  fluid to keep your pee (urine) clear or pale yellow. If you have diabetes:  Make sure you know the symptoms of hyperglycemia.  Follow your diabetes management plan, as told by your doctor. Make sure you:  Take insulin and medicines as told.  Follow your exercise plan.  Follow your meal plan. Eat on time. Do not skip meals.  Check your blood sugar as often as told. Make sure to check before and after exercise. If you exercise longer or in a different way than you normally do, check your blood sugar more often.  Follow your sick day plan whenever you cannot eat or drink normally. Make this plan ahead of time with your doctor.  Share your diabetes management plan with people in your workplace, school, and household.  Check your urine for ketones when you are ill and as told by your doctor.  Carry a card or wear jewelry that says that you have diabetes. Contact a doctor if:  Your blood sugar level is higher than 240 mg/dL (13.3 mmol/L) for 2 days in a row.  You have problems keeping your blood sugar in your target range.  High blood sugar happens often for you. Get help right away if:  You have trouble breathing.  You have a change in how you think, feel, or act (mental status).  You feel sick to your stomach (nauseous), and that feeling does not go away.  You cannot stop throwing up (vomiting). These symptoms may be an   emergency. Do not wait to see if the symptoms will go away. Get medical help right away. Call your local emergency services (911 in the [place] Do not drive yourself to the hospital.  Summary  Hyperglycemia is when the sugar (glucose) level in your blood is too high.  High blood sugar can happen to people who do or do not have diabetes.  Make sure you drink enough fluids, eat healthy foods, and exercise regularly.  Contact your doctor if you have problems keeping your blood sugar in your target range. This information is not intended to replace advice given  to you by your health care provider. Make sure you discuss any questions you have with your health care provider. Document Released: 04/28/2009 Document Revised: 03/18/2016 Document Reviewed: 03/18/2016 Elsevier Interactive Patient Education  2017 [employer]  

## 2017-06-16 NOTE — Progress Notes (Signed)
Patricia Mata to be D/C'd Home per MD order.  Discussed prescriptions and follow up appointments with the patient. Prescriptions given to patient, medication list explained in detail. Pt verbalized understanding.  Allergies as of 06/16/2017      Reactions   Ancef [cefazolin] Itching   Severe itching- after procedure, ancef was the antibiotic.-04/02/17   Sulfa Antibiotics Rash, Other (See Comments)   blisters      Medication List    STOP taking these medications   glimepiride 2 MG tablet Commonly known as:  AMARYL   sitaGLIPtin-metformin 50-1000 MG tablet Commonly known as:  JANUMET     TAKE these medications   albuterol 108 (90 Base) MCG/ACT inhaler Commonly known as:  PROVENTIL HFA;VENTOLIN HFA Inhale 2 puffs into the lungs every 6 (six) hours as needed for wheezing or shortness of breath.   atorvastatin 40 MG tablet Commonly known as:  LIPITOR Take 40 mg by mouth daily.   colchicine 0.6 MG tablet Take 0.6 mg by mouth 2 (two) times daily as needed (gout flare).   EDARBYCLOR 40-12.5 MG Tabs Generic drug:  Azilsartan-Chlorthalidone Take 1 tablet by mouth daily.   furosemide 20 MG tablet Commonly known as:  LASIX TAKE 1 TABLET BY MOUTH TWICE A DAY What changed:    how much to take  how to take this  when to take this   hydrALAZINE 25 MG tablet Commonly known as:  APRESOLINE Take 50 mg by mouth 2 (two) times daily with a meal.   insulin glargine 100 unit/mL Sopn Commonly known as:  LANTUS Inject 0.14 mLs (14 Units total) into the skin daily after breakfast.   Insulin Pen Needle 29G X 5MM Misc Use as directed   latanoprost 0.005 % ophthalmic solution Commonly known as:  XALATAN Place 1 drop into both eyes at bedtime.   lidocaine-prilocaine cream Commonly known as:  EMLA Apply a quarter size of cream to port 1-2 hours prior to access. Cover with saran wrap.   Melatonin 10 MG Caps Take 10 mg by mouth at bedtime as needed (sleep).   metoprolol tartrate 50 MG  tablet Commonly known as:  LOPRESSOR Take 50 mg by mouth 2 (two) times daily.   prochlorperazine 10 MG tablet Commonly known as:  COMPAZINE Take 1 tablet (10 mg total) by mouth every 6 (six) hours as needed for nausea or vomiting.       Vitals:   06/16/17 0806 06/16/17 0948  BP: (!) 151/53 (!) 136/41  Pulse: 79 78  Resp: 16   Temp: 98.3 F (36.8 C)   SpO2: 98%     Skin clean, dry and intact without evidence of skin break down, no evidence of skin tears noted. IV catheter discontinued intact. Site without signs and symptoms of complications. Dressing and pressure applied. Pt denies pain at this time. No complaints noted.  An After Visit Summary was printed and given to the patient. Patient escorted via [place], and D/C home via private auto.  Dixie Dials RN, BSN

## 2017-06-16 NOTE — Discharge Summary (Signed)
[facility]  Physician Discharge Summary   Patient ID: Patricia Mata MRN: [id number] DOB/AGE: Jan 30, 1956 61 y.o.  Admit date: 06/14/2017 Discharge date: 06/16/2017  PCP: Glendale Chard, MD  DISCHARGE DIAGNOSES:  Principal Problem:   Symptomatic anemia Active Problems:   MDS (myelodysplastic syndrome) (HCC)   Essential hypertension   Severe aortic stenosis   Carotid artery disease (HCC)   RECOMMENDATIONS FOR OUTPATIENT FOLLOW UP: 1. Patient has been started on Lantus for better glycemic control  DISCHARGE CONDITION: fair  Diet recommendation: Modified carbohydrate  Filed Weights   06/14/17 1340 06/14/17 1559 06/15/17 2100  Weight: 108.9 kg (240 lb) 113.1 kg (249 lb 5.4 oz) 111.2 kg (245 lb 2.4 oz)    INITIAL HISTORY: 61 year old female with complicated past medical history including colon cancer diagnosed in 2008 which is in remission, myelodysplastic syndrome currently on Azacitidine and followed by hematology/oncology (Dr. Alen Blew), who has been transfused multiple times previously.  Also has a history of severe aortic stenosis followed by cardiology and is being evaluated for aortic valve replacement.  She was sent in by her primary care physician due to severe anemia with a hemoglobin of 5.7.  She was hospitalized for further management.   Procedures:  Blood transfusion of 3 units  HOSPITAL COURSE:    Symptomatic anemia in the setting of myelodysplastic syndrome Patient has had multiple blood transfusion during the last few weeks.  She is being treated for her MDS by hematology/oncology with Azacitinide.  Hemoglobin improved to 8.5.  Hold off on further transfusion to avoid significant fluid overload.  Outpatient follow-up with oncology.  Hyperglycemia in the setting of type 2 diabetes Patient has known diagnosis of diabetes and is on oral agents.  Over the past few weeks she has noticed high glucose levels.  Reason for this is not entirely clear.  She has not  been on steroids recently.  She mentions that she has been compliant with her medications.  Her HbA1c is elevated at 11.6.  I do not think just oral medications will be enough for this patient.  She will need insulin.  Discussed in detail with her.  She is reluctant however understands the need for this.    Patient was placed on Lantus 10 units to begin with.  Blood sugars responded appropriately.  Dose to be increased to 14 units today.  She knows to check her CBGs at home.  She will maintain a log for her primary care physician.  He would need to follow-up with her PCP before end of this week.  Recent cardiac catheterization was canceled due to hyperglycemia so it is even more important for her diabetes to be well controlled.  Severe aortic stenosis Followed by cardiology.  Plan is for left and right heart catheterization.  This was scheduled recently however was canceled due to hyperglycemia.  She currently is asymptomatic.  Continue her home medications.    Informed Dr. Gwenlyn Found of this admission and of the fact that patient has been started on insulin.  Chronic kidney disease stage III/hypokalemia Creatinine appears to be close to baseline.    History of essential hypertension Continue home medications.  History of coronary artery disease Stable.    Hypo-natremia Etiology unclear.  Improved.  Overall stable.  Long discussion with patient regarding her diabetes okay for discharge home today.   PERTINENT LABS:  The results of significant diagnostics from this hospitalization (including imaging, microbiology, ancillary and laboratory) are listed below for reference.     Labs: Basic Metabolic Panel:  Recent Labs  Lab 06/10/17 1028 06/14/17 1123 06/15/17 0500 06/16/17 0417  NA 132* 128* 129* 133*  K 4.7 4.2 3.3* 3.5  CL 89* 92* 94* 96*  CO2 22 24 26 30   GLUCOSE 506* 456* 380* 208*  BUN 23 27* 26* 24*  CREATININE 1.18* 1.44* 1.19* 1.04*  CALCIUM 9.0 8.6* 8.5* 8.7*   Liver  Function Tests: Recent Labs  Lab 06/14/17 1123  AST 35  ALT 26  ALKPHOS 164*  BILITOT 3.2*  PROT 6.7  ALBUMIN 3.0*   CBC: Recent Labs  Lab 06/10/17 1028 06/14/17 1123 06/14/17 1311 06/14/17 2122 06/15/17 0500 06/16/17 0417  WBC 3.1* 1.7* 1.7*  --  2.6* 2.5*  NEUTROABS 2.0  --  1.2*  --   --   --   HGB 6.3* 5.7* 6.2* 8.0* 8.8* 8.5*  HCT 19.0* 16.5* 18.1* 23.2* 25.1* 24.5*  MCV 101* 98.2 96.8  --  90.3 91.4  PLT 251 277 283  --  269 271    CBG: Recent Labs  Lab 06/15/17 1131 06/15/17 1723 06/15/17 2119 06/16/17 0736 06/16/17 1137  GLUCAP 381* 307* 175* 217* 299*     IMAGING STUDIES Dg Chest 2 View  Result Date: 06/10/2017 CLINICAL DATA:  Pre angiogram with history of bilateral carotid artery stenosis, also history of colon carcinoma EXAM: CHEST  2 VIEW COMPARISON:  CT chest of 03/26/2011 FINDINGS: No active infiltrate or effusion is seen. Right-sided Port-A-Cath tip overlies the upper SVC. Mediastinal and hilar contours are unremarkable and mild cardiomegaly is present. No acute bony abnormality is seen. IMPRESSION: 1. No definite active process. 2. Right-sided Port-A-Cath tip overlies the upper SVC. 3. Moderate cardiomegaly. Electronically Signed   By: Ivar Drape M.D.   On: 06/10/2017 13:56   Dg Chest Port 1 View  Result Date: 06/14/2017 CLINICAL DATA:  Dyspnea. EXAM: PORTABLE CHEST 1 VIEW COMPARISON:  Chest x-ray dated June 10, 2017. FINDINGS: Unchanged positioning of right-sided port catheter with the tip in the upper SVC. Stable moderate cardiomegaly. Mild pulmonary vascular congestion. No focal consolidation, pleural effusion, or pneumothorax. No acute osseous abnormality. IMPRESSION: Stable moderate cardiomegaly and mild pulmonary vascular congestion. Electronically Signed   By: Titus Dubin M.D.   On: 06/14/2017 13:18    DISCHARGE EXAMINATION: Vitals:   06/15/17 2100 06/16/17 0300 06/16/17 0806 06/16/17 0948  BP: (!) 154/53 (!) 139/56 (!) 151/53 (!)  136/41  Pulse: 71 80 79 78  Resp: 18 18 16    Temp: 98.4 F (36.9 C) 98.4 F (36.9 C) 98.3 F (36.8 C)   TempSrc: Oral Oral Oral   SpO2: 100% 98% 98%   Weight: 111.2 kg (245 lb 2.4 oz)     Height:       General appearance: alert, cooperative, appears stated age and no distress Resp: clear to auscultation bilaterally Cardio: regular rate and rhythm, S1, S2 normal, no murmur, click, rub or gallop GI: soft, non-tender; bowel sounds normal; no masses,  no organomegaly  DISPOSITION: Home  Discharge Instructions    Call MD for:  extreme fatigue   Complete by:  As directed    Call MD for:  persistant dizziness or light-headedness   Complete by:  As directed    Call MD for:  persistant nausea and vomiting   Complete by:  As directed    Call MD for:  severe uncontrolled pain   Complete by:  As directed    Call MD for:  temperature >100.4   Complete by:  As directed  Diet Carb Modified   Complete by:  As directed    Discharge instructions   Complete by:  As directed    Please take your medications as prescribed.  Please check your blood glucose levels at least 3 times a day.  See your doctor within this week for further management of your diabetes.  If your blood glucose level is less than 100 in the morning hold off on taking your insulin until you have spoken to your doctor.  He will also need to have blood work done within this week to check a hemoglobin.  You were cared for by a hospitalist during your hospital stay. If you have any questions about your discharge medications or the care you received while you were in the hospital after you are discharged, you can call the unit and asked to speak with the hospitalist on call if the hospitalist that took care of you is not available. Once you are discharged, your primary care physician will handle any further medical issues. Please note that NO REFILLS for any discharge medications will be authorized once you are discharged, as it is  imperative that you return to your primary care physician (or establish a relationship with a primary care physician if you do not have one) for your aftercare needs so that they can reassess your need for medications and monitor your lab values. If you do not have a primary care physician, you can call [phone] for a physician referral.   Increase activity slowly   Complete by:  As directed        Discharge medications Allergies as of 06/16/2017      Reactions   Ancef [cefazolin] Itching   Severe itching- after procedure, ancef was the antibiotic.-04/02/17   Sulfa Antibiotics Rash, Other (See Comments)   blisters      Medication List    STOP taking these medications   glimepiride 2 MG tablet Commonly known as:  AMARYL   sitaGLIPtin-metformin 50-1000 MG tablet Commonly known as:  JANUMET     TAKE these medications   albuterol 108 (90 Base) MCG/ACT inhaler Commonly known as:  PROVENTIL HFA;VENTOLIN HFA Inhale 2 puffs into the lungs every 6 (six) hours as needed for wheezing or shortness of breath.   atorvastatin 40 MG tablet Commonly known as:  LIPITOR Take 40 mg by mouth daily.   colchicine 0.6 MG tablet Take 0.6 mg by mouth 2 (two) times daily as needed (gout flare).   EDARBYCLOR 40-12.5 MG Tabs Generic drug:  Azilsartan-Chlorthalidone Take 1 tablet by mouth daily.   furosemide 20 MG tablet Commonly known as:  LASIX TAKE 1 TABLET BY MOUTH TWICE A DAY What changed:    how much to take  how to take this  when to take this   hydrALAZINE 25 MG tablet Commonly known as:  APRESOLINE Take 50 mg by mouth 2 (two) times daily with a meal.   insulin glargine 100 unit/mL Sopn Commonly known as:  LANTUS Inject 0.14 mLs (14 Units total) into the skin daily after breakfast.   Insulin Pen Needle 29G X 5MM Misc Use as directed   latanoprost 0.005 % ophthalmic solution Commonly known as:  XALATAN Place 1 drop into both eyes at bedtime.   lidocaine-prilocaine  cream Commonly known as:  EMLA Apply a quarter size of cream to port 1-2 hours prior to access. Cover with saran wrap.   Melatonin 10 MG Caps Take 10 mg by mouth at bedtime as needed (sleep).  metoprolol tartrate 50 MG tablet Commonly known as:  LOPRESSOR Take 50 mg by mouth 2 (two) times daily.   prochlorperazine 10 MG tablet Commonly known as:  COMPAZINE Take 1 tablet (10 mg total) by mouth every 6 (six) hours as needed for nausea or vomiting.        Follow-up Information    Glendale Chard, MD. Schedule an appointment as soon as possible for a visit in 3 day(s).   Specialty:  Internal Medicine Why:  for further management of diabetes Contact information: [address] [place] [address] [phone]        [name], MD. Schedule an appointment as soon as possible for a visit.   Specialty:  Oncology Contact information: [address] [address] [phone]        Lorretta Harp, MD Follow up.   Specialties:  Cardiology, Radiology Why:  will need to reschedule the cardiac catheterization Contact information: [address] [place] [address] [phone]           TOTAL DISCHARGE TIME: 35 minutes  [facility] Pager [phone]  06/16/2017, 2:53 PM

## 2017-06-16 NOTE — Progress Notes (Signed)
Patient refused bed alarm. Education provided. Will continue to educate and monitor.

## 2017-06-19 ENCOUNTER — Telehealth: Payer: Self-pay | Admitting: Cardiovascular Disease

## 2017-06-19 NOTE — Telephone Encounter (Signed)
Called pt to schedule f/u appt with Dr. Gwenlyn Found. Pt currently scheduled for 12/14 and stated she would like to keep this appt instead of making sooner appt--still trying to bring her sugar level down.

## 2017-06-24 NOTE — Telephone Encounter (Signed)
OK 

## 2017-06-27 ENCOUNTER — Ambulatory Visit: Payer: BC Managed Care – PPO | Admitting: Cardiovascular Disease

## 2017-06-27 ENCOUNTER — Encounter: Payer: Self-pay | Admitting: Cardiovascular Disease

## 2017-06-27 VITALS — BP 172/62 | HR 65 | Ht 64.0 in | Wt 216.8 lb

## 2017-06-27 DIAGNOSIS — I6523 Occlusion and stenosis of bilateral carotid arteries: Secondary | ICD-10-CM

## 2017-06-27 DIAGNOSIS — I350 Nonrheumatic aortic (valve) stenosis: Secondary | ICD-10-CM | POA: Diagnosis not present

## 2017-06-27 DIAGNOSIS — I5032 Chronic diastolic (congestive) heart failure: Secondary | ICD-10-CM | POA: Diagnosis not present

## 2017-06-27 DIAGNOSIS — I10 Essential (primary) hypertension: Secondary | ICD-10-CM | POA: Diagnosis not present

## 2017-06-27 MED ORDER — HYDRALAZINE HCL 25 MG PO TABS: 50.0000 mg | ORAL_TABLET | Freq: Three times a day (TID) | ORAL | 6 refills | Status: DC

## 2017-06-27 NOTE — Assessment & Plan Note (Signed)
History of severe aortic stenosis with 2-D echo performed 05/16/17 revealing hyperdynamic LV with a valve area 0.7 cm and a peak gradient of 70. She was scheduled for right left heart cath however because of severe hyper glycemia as well as severe anemia this was postponed. She was transfused 3 units of blood in his getting Epogen injections. Apparently she has myelodysplasia. She was placed on subcutaneous insulin as well. She is on oral diuretic and her edema has resolved. Her dyspnea has remarkably improved as a result of the diuretic and blood transfusion. At this point, given all of her comorbidities and the relative lack of symptoms I'm going to put off performing right and left heart cath. We will continue to monitor her by 2-D echocardiography.

## 2017-06-27 NOTE — Progress Notes (Signed)
06/27/2017 Patricia Mata   Aug 04, 1955  [id number]  Primary Physician Patricia Chard, MD Primary Cardiologist: Patricia Harp MD Patricia Mata, [place]  HPI:  [name] is a 61 y.o.  .  married African-American female mother of one child, grandmother of 2 children referred by Dr. Baird Cancer for cardiac evaluation because of lower extremity edema. She is accompanied by her husband Patricia Mata and daughter Patricia Mata . I last saw her in the 06/10/17.I apparently saw her 20 years ago which time I did a heart catheterization on her revealing normal coronary arteries and normal LV function. She was referred for evaluation of bilateral lower extremity edema. She does have a history of hypertension, hyperlipidemia and diabetes. She stopped smoking 20 years ago. She's never had a heart attack or stroke. Her father did have a myocardial infarction in his late 52s and died from that. She had colon cancer 10 years ago and had surgical cure with chemotherapy. She remains cancer free. She's recently had iron deficiency anemia and has undergone multiple transfusions. She's noticed increasing lower extremity edema over the last several weeks to months. She also drinks 2 mixed drinks a day.  Since I saw her she was begun on low-dose furosemide which resulted in improvement in her edema and dyspnea. Outpatient diagnostic studies included carotid Dopplers performed 06/03/17 that showed an occluded right ICA with moderate left ICA stenosis. 2-D echo performed 05/16/17 showed normal LV systolic function, moderate concentric LVH with grade 2 diastolic dysfunction and severe aortic stenosis with valve area 0.7 cm and a peak gradient of 70 mmHg.  I have arranged for her to undergo right and left heart cath to further evaluate her aortic stenosis however her precath labs were remarkable for severe hyperglycemia and anemia. She ultimately was transfused 3 units of packed red blood cells. 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 She was also hospitalized and placed on subcutaneous insulin. Since I saw her 2 weeks ago her dyspnea has markedly improved as result of transfusion and diuresis. Her hemoglobin has come up from the 6 range up to the mid 8 range. At this point, given her relative lack of symptoms and her other comorbidities going to defer invasive evaluation of her aortic stenosis we'll continue to monitor her noninvasively.   Current Meds  Medication Sig  . atorvastatin (LIPITOR) 40 MG tablet Take 40 mg by mouth daily.  . Azilsartan-Chlorthalidone (EDARBYCLOR) 40-12.5 MG TABS Take 1 tablet by mouth daily.   . colchicine 0.6 MG tablet Take 0.6 mg by mouth 2 (two) times daily as needed (gout flare).   . furosemide (LASIX) 20 MG tablet TAKE 1 TABLET BY MOUTH TWICE A DAY (Patient taking differently: TAKE 20 mg BY MOUTH TWICE A DAY)  . hydrALAZINE (APRESOLINE) 25 MG tablet Take 2 tablets (50 mg total) by mouth 3 (three) times daily.  . insulin glargine (LANTUS) 100 unit/mL SOPN Inject 0.14 mLs (14 Units total) into the skin daily after breakfast.  . Insulin Pen Needle 29G X 5MM MISC Use as directed  . latanoprost (XALATAN) 0.005 % ophthalmic solution Place 1 drop into both eyes at bedtime.   . lidocaine-prilocaine (EMLA) cream Apply a quarter size of cream to port 1-2 hours prior to access. Cover with saran wrap.  . Melatonin 10 MG CAPS Take 10 mg by mouth at bedtime as needed (sleep).  . metoprolol (LOPRESSOR) 50 MG tablet Take 50 mg by mouth 2 (two) times daily.   . prochlorperazine (COMPAZINE) 10  MG tablet Take 1 tablet (10 mg total) by mouth every 6 (six) hours as needed for nausea or vomiting.  . [DISCONTINUED] hydrALAZINE (APRESOLINE) 25 MG tablet Take 50 mg by mouth 2 (two) times daily with a meal.      Allergies  Allergen Reactions  . Ancef [Cefazolin] Itching    Severe itching- after procedure, ancef was the antibiotic.-04/02/17  . Sulfa Antibiotics Rash and Other (See Comments)     blisters    Social History   Socioeconomic History  . Marital status: Married    Spouse name: Not on file  . Number of children: Not on file  . Years of education: Not on file  . Highest education level: Not on file  Social Needs  . Financial resource strain: Not on file  . Food insecurity - worry: Not on file  . Food insecurity - inability: Not on file  . Transportation needs - medical: Not on file  . Transportation needs - non-medical: Not on file  Occupational History  . Not on file  Tobacco Use  . Smoking status: Former Smoker    Types: Cigarettes    Last attempt to quit: 01/31/2001    Years since quitting: 16.4  . Smokeless tobacco: Former Network engineer and Sexual Activity  . Alcohol use: Yes    Alcohol/week: 1.2 oz    Types: 2 Shots of liquor per week  . Drug use: Not on file  . Sexual activity: Not on file  Other Topics Concern  . Not on file  Social History Narrative  . Not on file     Review of Systems: General: negative for chills, fever, night sweats or weight changes.  Cardiovascular: negative for chest pain, dyspnea on exertion, edema, orthopnea, palpitations, paroxysmal nocturnal dyspnea or shortness of breath Dermatological: negative for rash Respiratory: negative for cough or wheezing Urologic: negative for hematuria Abdominal: negative for nausea, vomiting, diarrhea, bright red blood per rectum, melena, or hematemesis Neurologic: negative for visual changes, syncope, or dizziness All other systems reviewed and are otherwise negative except as noted above.    Blood pressure (!) 172/62, pulse 65, height 5\' 4"  (1.626 m), weight 216 lb 12.8 oz (98.3 kg).  General appearance: alert and no distress Neck: no adenopathy, no JVD, supple, symmetrical, trachea midline, thyroid not enlarged, symmetric, no tenderness/mass/nodules and Bilateral carotid bruits left greater than right Lungs: clear to auscultation bilaterally Heart: 3/6 systolic ejection murmur  at the base consistent with aortic stenosis. Extremities: extremities normal, atraumatic, no cyanosis or edema Pulses: 2+ and symmetric Skin: Skin color, texture, turgor normal. No rashes or lesions Neurologic: Alert and oriented X 3, normal strength and tone. Normal symmetric reflexes. Normal coordination and gait  EKG sinus rhythm at 65 without ST or T-wave changes. I personally reviewed this EKG.  ASSESSMENT AND PLAN:   Essential hypertension History of essential hypertension blood pressure measured at 172/62. She is on Edarbychlor (40-12.5), hydralazine and metoprolol. I am going to increase her hydralazine from twice a day to 3 times a day.  Hyperlipidemia History of hyperlipidemia on statin therapy followed by her PCP  Severe aortic stenosis History of severe aortic stenosis with 2-D echo performed 05/16/17 revealing hyperdynamic LV with a valve area 0.7 cm and a peak gradient of 70. She was scheduled for right left heart cath however because of severe hyper glycemia as well as severe anemia this was postponed. She was transfused 3 units of blood in his getting Epogen injections. Apparently she has  myelodysplasia. She was placed on subcutaneous insulin as well. She is on oral diuretic and her edema has resolved. Her dyspnea has remarkably improved as a result of the diuretic and blood transfusion. At this point, given all of her comorbidities and the relative lack of symptoms I'm going to put off performing right and left heart cath. We will continue to monitor her by 2-D echocardiography.  Carotid artery disease ([place]) History of carotid artery disease with carotid Dopplers performed 05/16/17 revealing an occluded right internal carotid artery with moderate left ICA stenosis. She is neurologically symptomatic. We'll continue to follow this by 2+ ultrasound on an annual basis.      Patricia Harp MD FACP,FACC,FAHA, [facility] 06/27/2017 9:45 AM

## 2017-06-27 NOTE — Assessment & Plan Note (Signed)
History of essential hypertension blood pressure measured at 172/62. She is on Edarbychlor (40-12.5), hydralazine and metoprolol. I am going to increase her hydralazine from twice a day to 3 times a day.

## 2017-06-27 NOTE — Assessment & Plan Note (Signed)
History of carotid artery disease with carotid Dopplers performed 05/16/17 revealing an occluded right internal carotid artery with moderate left ICA stenosis. She is neurologically symptomatic. We'll continue to follow this by 2+ ultrasound on an annual basis.

## 2017-06-27 NOTE — Addendum Note (Signed)
Addended by: Zebedee Iba on: 06/27/2017 04:19 PM   Modules accepted: Orders

## 2017-06-27 NOTE — Assessment & Plan Note (Signed)
History of hyperlipidemia on statin therapy followed by her PCP. 

## 2017-06-27 NOTE — Patient Instructions (Signed)
Medication Instructions: Your physician recommends that you continue on your current medications as directed. Please refer to the Current Medication list given to you today.  Increase Hydralazine to three times daily.  Testing/Procedures: Your physician has requested that you have a carotid duplex. This test is an ultrasound of the carotid arteries in your neck. It looks at blood flow through these arteries that supply the brain with blood. Allow one hour for this exam. There are no restrictions or special instructions.  Your physician has requested that you have an echocardiogram. Echocardiography is a painless test that uses sound waves to create images of your heart. It provides your doctor with information about the size and shape of your heart and how well your heart's chambers and valves are working. This procedure takes approximately one hour. There are no restrictions for this procedure. (In November 2019)   Follow-Up: We request that you follow-up in: 3 months with an extender and in 6 months with Dr Andria Rhein will receive a reminder letter in the mail two months in advance. If you don't receive a letter, please call our office to schedule the follow-up appointment.  If you need a refill on your cardiac medications before your next appointment, please call your pharmacy.

## 2017-07-04 ENCOUNTER — Other Ambulatory Visit: Payer: Self-pay | Admitting: Oncology

## 2017-07-11 ENCOUNTER — Other Ambulatory Visit: Payer: Self-pay | Admitting: Oncology

## 2017-07-11 ENCOUNTER — Telehealth: Payer: Self-pay | Admitting: *Deleted

## 2017-07-11 DIAGNOSIS — D469 Myelodysplastic syndrome, unspecified: Secondary | ICD-10-CM

## 2017-07-11 NOTE — Telephone Encounter (Signed)
Patient called and left a voice mail message stating,"I haven't had any chemotherapy for the month of December. Has something changed? Please ask Dr. Alen Blew. Return number is [phone]."

## 2017-07-11 NOTE — Telephone Encounter (Signed)
She needs to resume in 07/2017. Let her know she will be scheduled back again starting 1/6 for 5 days.

## 2017-07-16 ENCOUNTER — Telehealth: Payer: Self-pay | Admitting: Oncology

## 2017-07-16 NOTE — Telephone Encounter (Signed)
Scheduled appt per 12/28 sch msg - left message for patient regarding appts that have been added.

## 2017-07-21 ENCOUNTER — Other Ambulatory Visit: Payer: Self-pay | Admitting: *Deleted

## 2017-07-21 ENCOUNTER — Inpatient Hospital Stay (HOSPITAL_BASED_OUTPATIENT_CLINIC_OR_DEPARTMENT_OTHER): Payer: BC Managed Care – PPO | Admitting: Oncology

## 2017-07-21 ENCOUNTER — Ambulatory Visit (HOSPITAL_BASED_OUTPATIENT_CLINIC_OR_DEPARTMENT_OTHER): Payer: BC Managed Care – PPO | Admitting: Medical

## 2017-07-21 ENCOUNTER — Other Ambulatory Visit: Payer: Self-pay

## 2017-07-21 ENCOUNTER — Encounter: Payer: Self-pay | Admitting: *Deleted

## 2017-07-21 ENCOUNTER — Inpatient Hospital Stay: Payer: BC Managed Care – PPO

## 2017-07-21 ENCOUNTER — Ambulatory Visit
Admission: RE | Admit: 2017-07-21 | Discharge: 2017-07-21 | Disposition: A | Discharge status: Home / Self Care | Payer: BC Managed Care – PPO | Source: Ambulatory Visit | Attending: Oncology | Admitting: Oncology

## 2017-07-21 VITALS — BP 173/48 | HR 73 | Temp 98.6°F | Resp 18 | Ht 64.0 in | Wt 211.7 lb

## 2017-07-21 VITALS — BP 160/35 | HR 65 | Temp 98.3°F | Resp 18 | Wt 212.5 lb

## 2017-07-21 DIAGNOSIS — Z794 Long term (current) use of insulin: Secondary | ICD-10-CM | POA: Diagnosis not present

## 2017-07-21 DIAGNOSIS — E1122 Type 2 diabetes mellitus with diabetic chronic kidney disease: Secondary | ICD-10-CM | POA: Diagnosis not present

## 2017-07-21 DIAGNOSIS — I350 Nonrheumatic aortic (valve) stenosis: Secondary | ICD-10-CM | POA: Diagnosis not present

## 2017-07-21 DIAGNOSIS — D469 Myelodysplastic syndrome, unspecified: Secondary | ICD-10-CM

## 2017-07-21 DIAGNOSIS — Z85038 Personal history of other malignant neoplasm of large intestine: Secondary | ICD-10-CM | POA: Diagnosis not present

## 2017-07-21 DIAGNOSIS — N181 Chronic kidney disease, stage 1: Secondary | ICD-10-CM

## 2017-07-21 DIAGNOSIS — D631 Anemia in chronic kidney disease: Secondary | ICD-10-CM

## 2017-07-21 DIAGNOSIS — R601 Generalized edema: Secondary | ICD-10-CM | POA: Diagnosis not present

## 2017-07-21 DIAGNOSIS — I129 Hypertensive chronic kidney disease with stage 1 through stage 4 chronic kidney disease, or unspecified chronic kidney disease: Secondary | ICD-10-CM

## 2017-07-21 DIAGNOSIS — Z5111 Encounter for antineoplastic chemotherapy: Secondary | ICD-10-CM | POA: Insufficient documentation

## 2017-07-21 DIAGNOSIS — I519 Heart disease, unspecified: Secondary | ICD-10-CM

## 2017-07-21 DIAGNOSIS — N189 Chronic kidney disease, unspecified: Secondary | ICD-10-CM | POA: Diagnosis not present

## 2017-07-21 DIAGNOSIS — D649 Anemia, unspecified: Secondary | ICD-10-CM

## 2017-07-21 LAB — CBC WITH DIFFERENTIAL/PLATELET
Abs Granulocyte: 1.6 10*3/uL (ref 1.5–6.5)
Basophils Absolute: 0 10*3/uL (ref 0.0–0.1)
Basophils Relative: 1 %
Eosinophils Absolute: 0 10*3/uL (ref 0.0–0.5)
Eosinophils Relative: 1 %
HCT: 20.5 % — ABNORMAL LOW (ref 34.8–46.6)
Hemoglobin: 7.1 g/dL — ABNORMAL LOW (ref 11.6–15.9)
Lymphocytes Relative: 16 %
Lymphs Abs: 0.5 10*3/uL — ABNORMAL LOW (ref 0.9–3.3)
MCH: 33.4 pg (ref 25.1–34.0)
MCHC: 34.4 g/dL (ref 31.5–36.0)
MCV: 97.3 fL (ref 79.5–101.0)
Monocytes Absolute: 0.8 10*3/uL (ref 0.1–0.9)
Monocytes Relative: 27 %
Neutro Abs: 1.6 10*3/uL (ref 1.5–6.5)
Neutrophils Relative %: 55 %
Platelets: 166 10*3/uL (ref 145–400)
RBC: 2.11 MIL/uL — ABNORMAL LOW (ref 3.70–5.45)
RDW: 28.7 % — ABNORMAL HIGH (ref 11.2–16.1)
WBC: 2.9 10*3/uL — ABNORMAL LOW (ref 3.9–10.3)

## 2017-07-21 LAB — COMPREHENSIVE METABOLIC PANEL
ALT: 43 U/L (ref 0–55)
AST: 49 U/L — ABNORMAL HIGH (ref 5–34)
Albumin: 3.8 g/dL (ref 3.5–5.0)
Alkaline Phosphatase: 145 U/L (ref 40–150)
Anion gap: 8 (ref 3–11)
BUN: 30 mg/dL — ABNORMAL HIGH (ref 7–26)
CO2: 26 mmol/L (ref 22–29)
Calcium: 9.5 mg/dL (ref 8.4–10.4)
Chloride: 101 mmol/L (ref 98–109)
Creatinine, Ser: 1.15 mg/dL — ABNORMAL HIGH (ref 0.60–1.10)
GFR calc Af Amer: 58 mL/min — ABNORMAL LOW (ref >60)
GFR calc non Af Amer: 50 mL/min — ABNORMAL LOW (ref >60)
Glucose, Bld: 280 mg/dL — ABNORMAL HIGH (ref 70–140)
Potassium: 4.4 mmol/L (ref 3.3–4.7)
Sodium: 135 mmol/L — ABNORMAL LOW (ref 136–145)
Total Bilirubin: 0.9 mg/dL (ref 0.2–1.2)
Total Protein: 7.4 g/dL (ref 6.4–8.3)

## 2017-07-21 LAB — PREPARE RBC (CROSSMATCH)

## 2017-07-21 MED ORDER — SODIUM CHLORIDE 0.9 % IV SOLN
250.0000 mL | Freq: Once | INTRAVENOUS | Status: AC
  Administered 2017-07-21: 250 mL via INTRAVENOUS

## 2017-07-21 MED ORDER — DIPHENHYDRAMINE HCL 25 MG PO CAPS
25.0000 mg | ORAL_CAPSULE | Freq: Once | ORAL | Status: AC
  Administered 2017-07-21: 25 mg via ORAL

## 2017-07-21 MED ORDER — ACETAMINOPHEN 325 MG PO TABS
ORAL_TABLET | ORAL | Status: AC
  Filled 2017-07-21: qty 2

## 2017-07-21 MED ORDER — ACETAMINOPHEN 325 MG PO TABS
650.0000 mg | ORAL_TABLET | Freq: Once | ORAL | Status: AC
  Administered 2017-07-21: 650 mg via ORAL

## 2017-07-21 MED ORDER — DIPHENHYDRAMINE HCL 25 MG PO CAPS
ORAL_CAPSULE | ORAL | Status: AC
  Filled 2017-07-21: qty 1

## 2017-07-21 MED ORDER — ONDANSETRON HCL 8 MG PO TABS
8.0000 mg | ORAL_TABLET | Freq: Once | ORAL | Status: AC
  Administered 2017-07-21: 8 mg via ORAL

## 2017-07-21 MED ORDER — ONDANSETRON HCL 8 MG PO TABS
ORAL_TABLET | ORAL | Status: AC
  Filled 2017-07-21: qty 1

## 2017-07-21 MED ORDER — AZACITIDINE CHEMO SQ INJECTION
76.0000 mg/m2 | Freq: Once | INTRAMUSCULAR | Status: AC
  Administered 2017-07-21: 175 mg via SUBCUTANEOUS
  Filled 2017-07-21: qty 7

## 2017-07-21 NOTE — Addendum Note (Signed)
Addended by: Harle Stanford on: 07/21/2017 02:58 PM   Modules accepted: Orders

## 2017-07-21 NOTE — Progress Notes (Signed)
Hematology and Oncology Follow Up Visit  Patricia Mata [id number] 25-Aug-1955 62 y.o. 07/21/2017 10:11 AM   Principle Diagnosis: 62 year old with:  1. T3 N1 colon cancer in 10/2006. She had been in remission since the completion of therapy in 2008. 2. 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 Her IPSS-R score was calculated and based on her characteristics she has a score of 2.5.    Prior Therapy: She is S/P hemicolectomy done in 11/2006. 1/13 lymph nodes are positive. (T3N1). She is S/P 12 cycles of FOLFOX completed in 05/2007  Current therapy: 5-azacytidine at 75 mg/m square subcutaneously started in October 2018.  She completed 2 cycles of therapy.  Interim History:  Mrs. Laughter return today for a follow up visit. Since the last visit, she was hospitalized in December 2018 with a symptoms of hyperglycemia and heart failure.  She was found to have aortic stenosis as well as worsening hyperglycemia that required insulin.  She received also aggressive diuresis for generalized anasarca.  Since her discharge, she reports feeling better at this time but overall appears to be fatigued.  Her mobility is improving but still very slow.  She denies any falls or syncope.  She denies any chest pain or difficulty breathing.  Her appetite remains poor and of lost more weight.  She denies any dysphagia or difficulty swallowing.  Her quality of life and performance status has been relatively stable although poor as of late.  She is not report any headaches blurry vision, syncope or seizures. She does not report any fevers, chills sweats. He does not report any chest pain, palpitation orthopnea or leg edema. She does not report any cough, wheezing or hemoptysis. She does not report any nausea, vomiting or abdominal pain. She does not report any frequency urgency or hesitancy. She does  not report any skeletal complaints. Remaining review of systems unremarkable.  Medications: I have reviewed the patient's current medications.   Allergies:  Allergies  Allergen Reactions  . Ancef [Cefazolin] Itching    Severe itching- after procedure, ancef was the antibiotic.-04/02/17  . Sulfa Antibiotics Rash and Other (See Comments)    blisters     Physical Exam: Blood pressure (!) 173/48, pulse 73, temperature 98.6 F (37 C), temperature source Oral, resp. rate 18, height _0  (1.626 m), weight 211 lb 11.2 oz (96 kg), SpO2 100 %. ECOG: 0 General appearance: Well-appearing woman without distress. Head: Normocephalic, without obvious abnormality no oral ulcers or thrush. Neck: no adenopathy Lymph nodes: Cervical, supraclavicular, and axillary nodes normal. Heart:regular rate and rhythm, S1, S2 normal, no murmur, click, rub or gallop Lung:chest clear, no wheezing, rales, normal symmetric air entry Abdomin: soft, non-tender, without masses or organomegaly no rebound or guarding. EXT:no edema noted.   Lab Results: Lab Results  Component Value Date   WBC 2.9 (L) 07/21/2017   HGB 7.1 (L) 07/21/2017   HCT 20.5 (L) 07/21/2017   MCV 97.3 07/21/2017   PLT 166 07/21/2017     Chemistry      Component Value Date/Time   NA 133 (L) 06/16/2017 0417   NA 132 (L) 06/10/2017 1028   NA 134 (L) 05/26/2017 0949   K 3.5 06/16/2017 0417   K 4.0 05/26/2017 0949   CL 96 (L) 06/16/2017 0417   CL 96 (L) 03/31/2012 1513   CO2 30 06/16/2017 0417   CO2 24 05/26/2017 0949  BUN 24 (H) 06/16/2017 0417   BUN 23 06/10/2017 1028   BUN 29.2 (H) 05/26/2017 0949   CREATININE 1.04 (H) 06/16/2017 0417   CREATININE 1.4 (H) 05/26/2017 0949      Component Value Date/Time   CALCIUM 8.7 (L) 06/16/2017 0417   CALCIUM 8.9 05/26/2017 0949   ALKPHOS 164 (H) 06/14/2017 1123   ALKPHOS 157 (H) 05/26/2017 0949   AST 35 06/14/2017 1123   AST 29 05/26/2017 0949   ALT 26 06/14/2017 1123   ALT 23 05/26/2017  0949   BILITOT 3.2 (H) 06/14/2017 1123   BILITOT 2.19 (H) 05/26/2017 0949       Impression and Plan:  62 year old female with the following issues:  1. Stage III (T3N1) colon cancer. She is 9 years out from the completion of therapy without any evidence to suggest recurrent disease.  2. MDS diagnosed in 2017:  Her bone marrow biopsy obtained on 02/01/2016 showed hypercellular marrow with the dyspoietic changes. 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 Her cytogenetics did not reveal any specific abnormalities. Significant blasts were not noted. Her IPSS-R score was calculated and based on her characteristics she has a score of 2.5.   She is currently on 5-azacytidine and received 2 cycles of therapy.  Risks and benefits of resuming therapy at this time was discussed and she is agreeable to continue.  The plan is to continue on the same dose and schedule subcutaneously for 5 days every 4 weeks.  We will continue with supportive transfusion as needed.  Her hemoglobin today is 7.1 and we will set up 2 units of packed red cell transfusion.   3. Hypertension: Blood pressure slightly elevated continues to follow with cardiology regarding this issue.  4.  Aortic stenosis: She follows with cardiology regarding this issue unlikely she will require valve replacement in the future.  5.  Lower extremity edema and generalized anasarca: Improved at this time.  6.  Diabetes: She is currently on insulin blood sugar under control.  7. Follow-up: Will be on a weekly basis to check her blood count and transfuse as needed.  She will receive cycle 4 of 5 azacitidine in February 2019.    Zola Button, MD 1/7/201910:11 AM

## 2017-07-21 NOTE — Progress Notes (Signed)
Patricia Mata consulted r/t patient's diastolic blood pressure. Patient assessed by PA and recommendations to follow-up with primary care and cardiologist. Patient verbalized understanding.   Vidaza dose discussed with pharmacy. Per Arbie Cookey in Pharmacy, ok to give although outside of 10% parameters. States she will send Dr. Alen Blew an in basket.

## 2017-07-21 NOTE — Patient Instructions (Signed)
[facility] Discharge Instructions for Patients Receiving Chemotherapy  Today you received the following chemotherapy agents Vidaza  To help prevent nausea and vomiting after your treatment, we encourage you to take your nausea medication as directed   If you develop nausea and vomiting that is not controlled by your nausea medication, call the clinic.   BELOW ARE SYMPTOMS THAT SHOULD BE REPORTED IMMEDIATELY:  *FEVER GREATER THAN 100.5 F  *CHILLS WITH OR WITHOUT FEVER  NAUSEA AND VOMITING THAT IS NOT CONTROLLED WITH YOUR NAUSEA MEDICATION  *UNUSUAL SHORTNESS OF BREATH  *UNUSUAL BRUISING OR BLEEDING  TENDERNESS IN MOUTH AND THROAT WITH OR WITHOUT PRESENCE OF ULCERS  *URINARY PROBLEMS  *BOWEL PROBLEMS  UNUSUAL RASH Items with * indicate a potential emergency and should be followed up as soon as possible.  Feel free to call the clinic should you have any questions or concerns. The clinic phone number is [phone].  Please show the [place] at check-in to the Emergency Department and triage nurse.    Blood Transfusion, Care After This sheet gives you information about how to care for yourself after your procedure. Your doctor may also give you more specific instructions. If you have problems or questions, contact your doctor. Follow these instructions at home:  Take over-the-counter and prescription medicines only as told by your doctor.  Go back to your normal activities as told by your doctor.  Follow instructions from your doctor about how to take care of the area where an IV tube was put into your vein (insertion site). Make sure you: ? Wash your hands with soap and water before you change your bandage (dressing). If there is no soap and water, use hand sanitizer. ? Change your bandage as told by your doctor.  Check your IV insertion site every day for signs of infection. Check for: ? More redness, swelling, or pain. ? More fluid or  blood. ? Warmth. ? Pus or a bad smell. Contact a doctor if:  You have more redness, swelling, or pain around the IV insertion site..  You have more fluid or blood coming from the IV insertion site.  Your IV insertion site feels warm to the touch.  You have pus or a bad smell coming from the IV insertion site.  Your pee (urine) turns pink, red, or brown.  You feel weak after doing your normal activities. Get help right away if:  You have signs of a serious allergic or body defense (immune) system reaction, including: ? Itchiness. ? Hives. ? Trouble breathing. ? Anxiety. ? Pain in your chest or lower back. ? Fever, flushing, and chills. ? Fast pulse. ? Rash. ? Watery poop (diarrhea). ? Throwing up (vomiting). ? Dark pee. ? Serious headache. ? Dizziness. ? Stiff neck. ? Yellow color in your face or the white parts of your eyes (jaundice). Summary  After a blood transfusion, return to your normal activities as told by your doctor.  Every day, check for signs of infection where the IV tube was put into your vein.  Some signs of infection are warm skin, more redness and pain, more fluid or blood, and pus or a bad smell where the needle went in.  Contact your doctor if you feel weak or have any unusual symptoms. This information is not intended to replace advice given to you by your health care provider. Make sure you discuss any questions you have with your health care provider. Document Released: 07/22/2014 Document Revised: 02/23/2016 Document  Reviewed: 02/23/2016 Elsevier Interactive Patient Education  2017 [employer].

## 2017-07-22 ENCOUNTER — Inpatient Hospital Stay: Payer: BC Managed Care – PPO

## 2017-07-22 ENCOUNTER — Other Ambulatory Visit: Payer: Self-pay | Admitting: Oncology

## 2017-07-22 VITALS — BP 138/47 | HR 63 | Temp 98.4°F | Resp 18

## 2017-07-22 DIAGNOSIS — D469 Myelodysplastic syndrome, unspecified: Secondary | ICD-10-CM

## 2017-07-22 DIAGNOSIS — Z5111 Encounter for antineoplastic chemotherapy: Secondary | ICD-10-CM | POA: Diagnosis not present

## 2017-07-22 DIAGNOSIS — D631 Anemia in chronic kidney disease: Secondary | ICD-10-CM

## 2017-07-22 DIAGNOSIS — N181 Chronic kidney disease, stage 1: Secondary | ICD-10-CM

## 2017-07-22 MED ORDER — AZACITIDINE CHEMO SQ INJECTION
72.0000 mg/m2 | Freq: Once | INTRAMUSCULAR | Status: AC
  Administered 2017-07-22: 150 mg via SUBCUTANEOUS
  Filled 2017-07-22: qty 6

## 2017-07-22 MED ORDER — AZACITIDINE CHEMO SQ INJECTION: 75.0000 mg/m2 | Freq: Once | INTRAMUSCULAR | Status: DC

## 2017-07-22 MED ORDER — DIPHENHYDRAMINE HCL 25 MG PO CAPS
25.0000 mg | ORAL_CAPSULE | Freq: Once | ORAL | Status: AC
  Administered 2017-07-22: 25 mg via ORAL

## 2017-07-22 MED ORDER — DIPHENHYDRAMINE HCL 25 MG PO CAPS
ORAL_CAPSULE | ORAL | Status: AC
  Filled 2017-07-22: qty 1

## 2017-07-22 MED ORDER — SODIUM CHLORIDE 0.9 % IV SOLN
250.0000 mL | Freq: Once | INTRAVENOUS | Status: AC
  Administered 2017-07-22: 250 mL via INTRAVENOUS

## 2017-07-22 MED ORDER — ONDANSETRON HCL 8 MG PO TABS
8.0000 mg | ORAL_TABLET | Freq: Once | ORAL | Status: AC
  Administered 2017-07-22: 8 mg via ORAL

## 2017-07-22 MED ORDER — ACETAMINOPHEN 325 MG PO TABS
ORAL_TABLET | ORAL | Status: AC
  Filled 2017-07-22: qty 2

## 2017-07-22 MED ORDER — ONDANSETRON HCL 8 MG PO TABS
ORAL_TABLET | ORAL | Status: AC
  Filled 2017-07-22: qty 1

## 2017-07-22 MED ORDER — ACETAMINOPHEN 325 MG PO TABS
650.0000 mg | ORAL_TABLET | Freq: Once | ORAL | Status: AC
  Administered 2017-07-22: 650 mg via ORAL

## 2017-07-22 NOTE — Patient Instructions (Signed)
[facility] Discharge Instructions for Patients Receiving Chemotherapy  Today you received the following chemotherapy agents Vidaza  To help prevent nausea and vomiting after your treatment, we encourage you to take your nausea medication as directed   If you develop nausea and vomiting that is not controlled by your nausea medication, call the clinic.   BELOW ARE SYMPTOMS THAT SHOULD BE REPORTED IMMEDIATELY:  *FEVER GREATER THAN 100.5 F  *CHILLS WITH OR WITHOUT FEVER  NAUSEA AND VOMITING THAT IS NOT CONTROLLED WITH YOUR NAUSEA MEDICATION  *UNUSUAL SHORTNESS OF BREATH  *UNUSUAL BRUISING OR BLEEDING  TENDERNESS IN MOUTH AND THROAT WITH OR WITHOUT PRESENCE OF ULCERS  *URINARY PROBLEMS  *BOWEL PROBLEMS  UNUSUAL RASH Items with * indicate a potential emergency and should be followed up as soon as possible.  Feel free to call the clinic should you have any questions or concerns. The clinic phone number is [phone].  Please show the [facility] at check-in to the Emergency Department and triage nurse.    Blood Transfusion, Care After This sheet gives you information about how to care for yourself after your procedure. Your doctor may also give you more specific instructions. If you have problems or questions, contact your doctor. Follow these instructions at home:  Take over-the-counter and prescription medicines only as told by your doctor.  Go back to your normal activities as told by your doctor.  Follow instructions from your doctor about how to take care of the area where an IV tube was put into your vein (insertion site). Make sure you: ? Wash your hands with soap and water before you change your bandage (dressing). If there is no soap and water, use hand sanitizer. ? Change your bandage as told by your doctor.  Check your IV insertion site every day for signs of infection. Check for: ? More redness, swelling, or pain. ? More fluid or  blood. ? Warmth. ? Pus or a bad smell. Contact a doctor if:  You have more redness, swelling, or pain around the IV insertion site..  You have more fluid or blood coming from the IV insertion site.  Your IV insertion site feels warm to the touch.  You have pus or a bad smell coming from the IV insertion site.  Your pee (urine) turns pink, red, or brown.  You feel weak after doing your normal activities. Get help right away if:  You have signs of a serious allergic or body defense (immune) system reaction, including: ? Itchiness. ? Hives. ? Trouble breathing. ? Anxiety. ? Pain in your chest or lower back. ? Fever, flushing, and chills. ? Fast pulse. ? Rash. ? Watery poop (diarrhea). ? Throwing up (vomiting). ? Dark pee. ? Serious headache. ? Dizziness. ? Stiff neck. ? Yellow color in your face or the white parts of your eyes (jaundice). Summary  After a blood transfusion, return to your normal activities as told by your doctor.  Every day, check for signs of infection where the IV tube was put into your vein.  Some signs of infection are warm skin, more redness and pain, more fluid or blood, and pus or a bad smell where the needle went in.  Contact your doctor if you feel weak or have any unusual symptoms. This information is not intended to replace advice given to you by your health care provider. Make sure you discuss any questions you have with your health care provider. Document Released: 07/22/2014 Document Revised: 02/23/2016 Document  Reviewed: 02/23/2016 Elsevier Interactive Patient Education  2017 [employer].  Blood Transfusion, Care After This sheet gives you information about how to care for yourself after your procedure. Your doctor may also give you more specific instructions. If you have problems or questions, contact your doctor. Follow these instructions at home:  Take over-the-counter and prescription medicines only as told by your doctor.  Go  back to your normal activities as told by your doctor.  Follow instructions from your doctor about how to take care of the area where an IV tube was put into your vein (insertion site). Make sure you: ? Wash your hands with soap and water before you change your bandage (dressing). If there is no soap and water, use hand sanitizer. ? Change your bandage as told by your doctor.  Check your IV insertion site every day for signs of infection. Check for: ? More redness, swelling, or pain. ? More fluid or blood. ? Warmth. ? Pus or a bad smell. Contact a doctor if:  You have more redness, swelling, or pain around the IV insertion site..  You have more fluid or blood coming from the IV insertion site.  Your IV insertion site feels warm to the touch.  You have pus or a bad smell coming from the IV insertion site.  Your pee (urine) turns pink, red, or brown.  You feel weak after doing your normal activities. Get help right away if:  You have signs of a serious allergic or body defense (immune) system reaction, including: ? Itchiness. ? Hives. ? Trouble breathing. ? Anxiety. ? Pain in your chest or lower back. ? Fever, flushing, and chills. ? Fast pulse. ? Rash. ? Watery poop (diarrhea). ? Throwing up (vomiting). ? Dark pee. ? Serious headache. ? Dizziness. ? Stiff neck. ? Yellow color in your face or the white parts of your eyes (jaundice). Summary  After a blood transfusion, return to your normal activities as told by your doctor.  Every day, check for signs of infection where the IV tube was put into your vein.  Some signs of infection are warm skin, more redness and pain, more fluid or blood, and pus or a bad smell where the needle went in.  Contact your doctor if you feel weak or have any unusual symptoms. This information is not intended to replace advice given to you by your health care provider. Make sure you discuss any questions you have with your health care  provider. Document Released: 07/22/2014 Document Revised: 02/23/2016 Document Reviewed: 02/23/2016 Elsevier Interactive Patient Education  2017 [employer].

## 2017-07-22 NOTE — Progress Notes (Signed)
Symptoms Management Clinic Progress Note   Patricia Mata [id number] 1955/09/26 62 y.o.  Patricia Mata is managed by Dr. Alen Blew  Actively treated with chemotherapy: yes  Current Therapy:  Vidaza  Last Treated:  07/21/2017  Assessment: Plan:    Diastolic dysfunction    Diastolic dysfunction with a diastolic blood pressure of 40: An EKG was completed and returned showing a normal sinus rhythm at 65 bpm.  I attempted to contact the patient's cardiologist Dr. Quay Burow I contacted her.  Primary care provider Dr. Overton Mam and made an appointment for the patient to see Dr. Baird Cancer on Wednesday, 07/23/2017 at 11 AM.  The patient was asymptomatic.  She was instructed to call 911 or proceed to the emergency room should she develop chest pains, shortness of breath, or diaphoresis.  Please see After Visit Summary for patient specific instructions.  Future Appointments  Date Time Provider [place]  07/23/2017  8:15 AM [facility] I25 DNS [facility] None  07/24/2017  8:00 AM [facility] I27 DNS [facility] None  07/25/2017  8:45 AM [facility] F19 [facility] None  07/29/2017 11:30 AM [facility] LAB 1 [facility] None  07/29/2017 12:00 PM [facility] I26 DNS [facility] None  08/04/2017  7:45 AM [facility] LAB 6 [facility] None  08/04/2017  8:00 AM [facility] I25 DNS [facility] None  08/18/2017  8:30 AM [facility] LAB 4 [facility] None  08/18/2017  9:00 AM Shadad, Mathis Dad, MD [facility] None  08/18/2017 10:15 AM [facility] F19 [facility] None  08/19/2017  9:00 AM [facility] A3 [facility] None  08/20/2017  9:00 AM [facility] A3 [facility] None  08/21/2017  9:00 AM [facility] D13 [facility] None  08/22/2017  9:00 AM [facility] B4 [facility] None    No orders of the defined types were placed in this encounter.      Subjective:   Patient ID:  Patricia Mata is a 62 y.o. (DOB 14-Sep-1955) female.  Chief Complaint: No chief complaint on file.   HPI Patricia Mata is a 62 y.o. with a  history of colon cancer dating to 2008, currently in remission and a diagnosis of anemia of renal insufficiency as well as a myelodysplastic syndrome.  Additionally the patient has a history of diabetes and coronary disease.  She also has a history of diastolic dysfunction and severe aortic stenosis.  She was receiving Vidaza today when it was noted that her diastolic blood pressure was 40.  The patient was asymptomatic.  She denied any chest pain, shortness of breath, diaphoresis, or orthostasis.  She is seen by Dr. Quay Burow who is her cardiologist and Dr. Glendale Chard who is her primary care provider.  Her diabetes is managed by a Pharm.D. at Dr. Baird Cancer office.  She is unsure when she has a follow-up appointment.  Medications: I have reviewed the patient's current medications.  Allergies:  Allergies  Allergen Reactions  . Ancef [Cefazolin] Itching    Severe itching- after procedure, ancef was the antibiotic.-04/02/17  . Sulfa Antibiotics Rash and Other (See Comments)    blisters    Past Medical History:  Diagnosis Date  . Diabetes mellitus without complication ([place])   . Hypertension   . Macrocytic anemia     Past Surgical History:  Procedure Laterality Date  . COLON SURGERY    . IR FLUORO GUIDE PORT INSERTION RIGHT  04/02/2017  . IR US GUIDE VASC ACCESS RIGHT  04/02/2017    Family History  Problem Relation Age of Onset  . Hypertension Mother   . Heart attack Father   .  Hypertension Sister   . Hypertension Brother   . Hypertension Sister   . Hypertension Sister   . Prostate cancer Brother   . HIV/AIDS Brother     Social History   Socioeconomic History  . Marital status: Married    Spouse name: Not on file  . Number of children: Not on file  . Years of education: Not on file  . Highest education level: Not on file  Social Needs  . Financial resource strain: Not on file  . Food insecurity - worry: Not on file  . Food insecurity - inability: Not on file  .  Transportation needs - medical: Not on file  . Transportation needs - non-medical: Not on file  Occupational History  . Not on file  Tobacco Use  . Smoking status: Former Smoker    Types: Cigarettes    Last attempt to quit: 01/31/2001    Years since quitting: 16.4  . Smokeless tobacco: Former Network engineer and Sexual Activity  . Alcohol use: Yes    Alcohol/week: 1.2 oz    Types: 2 Shots of liquor per week  . Drug use: Not on file  . Sexual activity: Not on file  Other Topics Concern  . Not on file  Social History Narrative  . Not on file    Past Medical History, Surgical history, Social history, and Family history were reviewed and updated as appropriate.   Please see review of systems for further details on the patient's review from today.   Review of Systems:  Review of Systems  Respiratory: Negative for chest tightness and shortness of breath.   Cardiovascular: Negative for chest pain, palpitations and leg swelling.  Neurological: Negative for dizziness and headaches.    Objective:   Physical Exam:  There were no vitals taken for this visit.  Physical Exam  Constitutional: No distress.  HENT:  Head: Normocephalic and atraumatic.  Cardiovascular:  Murmur heard.  Systolic murmur is present with a grade of 2/6. Pulmonary/Chest: Effort normal and breath sounds normal. No respiratory distress. She has no wheezes. She has no rales.  Musculoskeletal: She exhibits no edema.  Neurological: She is alert.  Skin: Skin is warm and dry. She is not diaphoretic.    Lab Review:     Component Value Date/Time   NA 135 (L) 07/21/2017 0842   NA 132 (L) 06/10/2017 1028   NA 134 (L) 05/26/2017 0949   K 4.4 07/21/2017 0842   K 4.0 05/26/2017 0949   CL 101 07/21/2017 0842   CL 96 (L) 03/31/2012 1513   CO2 26 07/21/2017 0842   CO2 24 05/26/2017 0949   GLUCOSE 280 (H) 07/21/2017 0842   GLUCOSE 263 (H) 05/26/2017 0949   GLUCOSE 291 (H) 03/31/2012 1513   BUN 30 (H) 07/21/2017  0842   BUN 23 06/10/2017 1028   BUN 29.2 (H) 05/26/2017 0949   CREATININE 1.15 (H) 07/21/2017 0842   CREATININE 1.4 (H) 05/26/2017 0949   CALCIUM 9.5 07/21/2017 0842   CALCIUM 8.9 05/26/2017 0949   PROT 7.4 07/21/2017 0842   PROT 7.3 05/26/2017 0949   ALBUMIN 3.8 07/21/2017 0842   ALBUMIN 3.2 (L) 05/26/2017 0949   AST 49 (H) 07/21/2017 0842   AST 29 05/26/2017 0949   ALT 43 07/21/2017 0842   ALT 23 05/26/2017 0949   ALKPHOS 145 07/21/2017 0842   ALKPHOS 157 (H) 05/26/2017 0949   BILITOT 0.9 07/21/2017 0842   BILITOT 2.19 (H) 05/26/2017 0949   GFRNONAA 50 (  L) 07/21/2017 0842   GFRAA 58 (L) 07/21/2017 0842       Component Value Date/Time   WBC 2.9 (L) 07/21/2017 0842   RBC 2.11 (L) 07/21/2017 0842   HGB 7.1 (L) 07/21/2017 0842   HGB 6.3 (LL) 06/10/2017 1028   HGB 5.7 (LL) 05/26/2017 0949   HCT 20.5 (L) 07/21/2017 0842   HCT 19.0 (L) 06/10/2017 1028   HCT 17.0 (L) 05/26/2017 0949   PLT 166 07/21/2017 0842   PLT 251 06/10/2017 1028   MCV 97.3 07/21/2017 0842   MCV 101 (H) 06/10/2017 1028   MCV 101.8 (H) 05/26/2017 0949   MCH 33.4 07/21/2017 0842   MCHC 34.4 07/21/2017 0842   RDW 28.7 (H) 07/21/2017 0842   RDW 26.1 (H) 06/10/2017 1028   RDW 27.7 (H) 05/26/2017 0949   LYMPHSABS 0.5 (L) 07/21/2017 0842   LYMPHSABS 0.5 (L) 06/10/2017 1028   LYMPHSABS 0.4 (L) 05/26/2017 0949   MONOABS 0.8 07/21/2017 0842   MONOABS 0.3 05/26/2017 0949   EOSABS 0.0 07/21/2017 0842   EOSABS 0.0 06/10/2017 1028   BASOSABS 0.0 07/21/2017 0842   BASOSABS 0.0 06/10/2017 1028   BASOSABS 0.1 05/26/2017 0949   -------------------------------  Imaging from last 24 hours (if applicable):  Radiology interpretation: No results found.

## 2017-07-23 ENCOUNTER — Inpatient Hospital Stay: Payer: BC Managed Care – PPO

## 2017-07-23 VITALS — BP 161/35 | HR 68 | Temp 97.9°F | Resp 16

## 2017-07-23 DIAGNOSIS — Z5111 Encounter for antineoplastic chemotherapy: Secondary | ICD-10-CM | POA: Diagnosis not present

## 2017-07-23 DIAGNOSIS — D469 Myelodysplastic syndrome, unspecified: Secondary | ICD-10-CM

## 2017-07-23 LAB — TYPE AND SCREEN
ABO/RH(D): A NEG
Antibody Screen: NEGATIVE
Unit division: 0
Unit division: 0

## 2017-07-23 LAB — BPAM RBC
Blood Product Expiration Date: 201901152359
Blood Product Expiration Date: 201902022359
ISSUE DATE / TIME: 201901071148
ISSUE DATE / TIME: 201901080933
Unit Type and Rh: 600
Unit Type and Rh: 600

## 2017-07-23 MED ORDER — ONDANSETRON HCL 8 MG PO TABS
ORAL_TABLET | ORAL | Status: AC
  Filled 2017-07-23: qty 1

## 2017-07-23 MED ORDER — AZACITIDINE CHEMO SQ INJECTION
150.0000 mg | Freq: Once | INTRAMUSCULAR | Status: AC
  Administered 2017-07-23: 150 mg via SUBCUTANEOUS
  Filled 2017-07-23: qty 6

## 2017-07-23 MED ORDER — ONDANSETRON HCL 8 MG PO TABS
8.0000 mg | ORAL_TABLET | Freq: Once | ORAL | Status: AC
  Administered 2017-07-23: 8 mg via ORAL

## 2017-07-23 NOTE — Patient Instructions (Signed)
[facility] Discharge Instructions for Patients Receiving Chemotherapy  Today you received the following chemotherapy agents Vidaza  To help prevent nausea and vomiting after your treatment, we encourage you to take your nausea medication as directed  If you develop nausea and vomiting that is not controlled by your nausea medication, call the clinic.   BELOW ARE SYMPTOMS THAT SHOULD BE REPORTED IMMEDIATELY:  *FEVER GREATER THAN 100.5 F  *CHILLS WITH OR WITHOUT FEVER  NAUSEA AND VOMITING THAT IS NOT CONTROLLED WITH YOUR NAUSEA MEDICATION  *UNUSUAL SHORTNESS OF BREATH  *UNUSUAL BRUISING OR BLEEDING  TENDERNESS IN MOUTH AND THROAT WITH OR WITHOUT PRESENCE OF ULCERS  *URINARY PROBLEMS  *BOWEL PROBLEMS  UNUSUAL RASH Items with * indicate a potential emergency and should be followed up as soon as possible.  Feel free to call the clinic should you have any questions or concerns. The clinic phone number is [phone].  Please show the CHEMO ALERT CARD at check-in to the Emergency Department and triage nurse.   

## 2017-07-24 ENCOUNTER — Inpatient Hospital Stay: Payer: BC Managed Care – PPO

## 2017-07-24 VITALS — BP 150/50 | HR 68 | Temp 98.1°F | Resp 17

## 2017-07-24 DIAGNOSIS — D469 Myelodysplastic syndrome, unspecified: Secondary | ICD-10-CM

## 2017-07-24 DIAGNOSIS — Z5111 Encounter for antineoplastic chemotherapy: Secondary | ICD-10-CM | POA: Diagnosis not present

## 2017-07-24 MED ORDER — AZACITIDINE CHEMO SQ INJECTION
150.0000 mg | Freq: Once | INTRAMUSCULAR | Status: AC
  Administered 2017-07-24: 150 mg via SUBCUTANEOUS
  Filled 2017-07-24: qty 6

## 2017-07-24 MED ORDER — ONDANSETRON HCL 8 MG PO TABS
8.0000 mg | ORAL_TABLET | Freq: Once | ORAL | Status: AC
  Administered 2017-07-24: 8 mg via ORAL

## 2017-07-24 MED ORDER — ONDANSETRON HCL 8 MG PO TABS
ORAL_TABLET | ORAL | Status: AC
  Filled 2017-07-24: qty 1

## 2017-07-24 NOTE — Patient Instructions (Signed)
[facility] Discharge Instructions for Patients Receiving Chemotherapy  Today you received the following chemotherapy agents Vidaza  To help prevent nausea and vomiting after your treatment, we encourage you to take your nausea medication as directed  If you develop nausea and vomiting that is not controlled by your nausea medication, call the clinic.   BELOW ARE SYMPTOMS THAT SHOULD BE REPORTED IMMEDIATELY:  *FEVER GREATER THAN 100.5 F  *CHILLS WITH OR WITHOUT FEVER  NAUSEA AND VOMITING THAT IS NOT CONTROLLED WITH YOUR NAUSEA MEDICATION  *UNUSUAL SHORTNESS OF BREATH  *UNUSUAL BRUISING OR BLEEDING  TENDERNESS IN MOUTH AND THROAT WITH OR WITHOUT PRESENCE OF ULCERS  *URINARY PROBLEMS  *BOWEL PROBLEMS  UNUSUAL RASH Items with * indicate a potential emergency and should be followed up as soon as possible.  Feel free to call the clinic should you have any questions or concerns. The clinic phone number is [phone].  Please show the CHEMO ALERT CARD at check-in to the Emergency Department and triage nurse.   

## 2017-07-25 ENCOUNTER — Inpatient Hospital Stay: Payer: BC Managed Care – PPO

## 2017-07-25 VITALS — BP 150/54 | HR 66 | Temp 98.6°F | Resp 16

## 2017-07-25 DIAGNOSIS — D469 Myelodysplastic syndrome, unspecified: Secondary | ICD-10-CM

## 2017-07-25 DIAGNOSIS — Z5111 Encounter for antineoplastic chemotherapy: Secondary | ICD-10-CM | POA: Diagnosis not present

## 2017-07-25 MED ORDER — ONDANSETRON HCL 8 MG PO TABS
8.0000 mg | ORAL_TABLET | Freq: Once | ORAL | Status: AC
  Administered 2017-07-25: 8 mg via ORAL

## 2017-07-25 MED ORDER — AZACITIDINE CHEMO SQ INJECTION
150.0000 mg | Freq: Once | INTRAMUSCULAR | Status: AC
  Administered 2017-07-25: 150 mg via SUBCUTANEOUS
  Filled 2017-07-25: qty 6

## 2017-07-25 MED ORDER — ONDANSETRON HCL 8 MG PO TABS
ORAL_TABLET | ORAL | Status: AC
  Filled 2017-07-25: qty 1

## 2017-07-25 NOTE — Patient Instructions (Signed)
[facility] Discharge Instructions for Patients Receiving Chemotherapy  Today you received the following chemotherapy agents Vidaza  To help prevent nausea and vomiting after your treatment, we encourage you to take your nausea medication as directed  If you develop nausea and vomiting that is not controlled by your nausea medication, call the clinic.   BELOW ARE SYMPTOMS THAT SHOULD BE REPORTED IMMEDIATELY:  *FEVER GREATER THAN 100.5 F  *CHILLS WITH OR WITHOUT FEVER  NAUSEA AND VOMITING THAT IS NOT CONTROLLED WITH YOUR NAUSEA MEDICATION  *UNUSUAL SHORTNESS OF BREATH  *UNUSUAL BRUISING OR BLEEDING  TENDERNESS IN MOUTH AND THROAT WITH OR WITHOUT PRESENCE OF ULCERS  *URINARY PROBLEMS  *BOWEL PROBLEMS  UNUSUAL RASH Items with * indicate a potential emergency and should be followed up as soon as possible.  Feel free to call the clinic should you have any questions or concerns. The clinic phone number is [phone].  Please show the CHEMO ALERT CARD at check-in to the Emergency Department and triage nurse.   

## 2017-07-29 ENCOUNTER — Inpatient Hospital Stay: Payer: BC Managed Care – PPO

## 2017-07-29 ENCOUNTER — Other Ambulatory Visit: Payer: BC Managed Care – PPO

## 2017-07-29 DIAGNOSIS — Z5111 Encounter for antineoplastic chemotherapy: Secondary | ICD-10-CM | POA: Diagnosis not present

## 2017-07-29 DIAGNOSIS — D469 Myelodysplastic syndrome, unspecified: Secondary | ICD-10-CM

## 2017-07-29 LAB — CBC WITH DIFFERENTIAL/PLATELET
Basophils Absolute: 0 10*3/uL (ref 0.0–0.1)
Basophils Relative: 1 %
Eosinophils Absolute: 0 10*3/uL (ref 0.0–0.5)
Eosinophils Relative: 1 %
HCT: 24.7 % — ABNORMAL LOW (ref 34.8–46.6)
Hemoglobin: 8.3 g/dL — ABNORMAL LOW (ref 11.6–15.9)
Lymphocytes Relative: 12 %
Lymphs Abs: 0.4 10*3/uL — ABNORMAL LOW (ref 0.9–3.3)
MCH: 32.6 pg (ref 25.1–34.0)
MCHC: 33.7 g/dL (ref 31.5–36.0)
MCV: 96.9 fL (ref 79.5–101.0)
Monocytes Absolute: 0.3 10*3/uL (ref 0.1–0.9)
Monocytes Relative: 8 %
Neutro Abs: 2.6 10*3/uL (ref 1.5–6.5)
Neutrophils Relative %: 78 %
Platelets: 156 10*3/uL (ref 145–400)
RBC: 2.55 MIL/uL — ABNORMAL LOW (ref 3.70–5.45)
RDW: 26.7 % — ABNORMAL HIGH (ref 11.2–16.1)
WBC: 3.4 10*3/uL — ABNORMAL LOW (ref 3.9–10.3)

## 2017-07-29 LAB — COMPREHENSIVE METABOLIC PANEL
ALT: 43 U/L (ref 0–55)
AST: 41 U/L — ABNORMAL HIGH (ref 5–34)
Albumin: 3.7 g/dL (ref 3.5–5.0)
Alkaline Phosphatase: 144 U/L (ref 40–150)
Anion gap: 10 (ref 3–11)
BUN: 23 mg/dL (ref 7–26)
CO2: 27 mmol/L (ref 22–29)
Calcium: 9.8 mg/dL (ref 8.4–10.4)
Chloride: 102 mmol/L (ref 98–109)
Creatinine, Ser: 0.95 mg/dL (ref 0.60–1.10)
GFR calc Af Amer: >60 mL/min (ref >60)
GFR calc non Af Amer: >60 mL/min (ref >60)
Glucose, Bld: 212 mg/dL — ABNORMAL HIGH (ref 70–140)
Potassium: 4.6 mmol/L (ref 3.3–4.7)
Sodium: 139 mmol/L (ref 136–145)
Total Bilirubin: 1.5 mg/dL — ABNORMAL HIGH (ref 0.2–1.2)
Total Protein: 7.3 g/dL (ref 6.4–8.3)

## 2017-07-29 NOTE — Progress Notes (Signed)
Patient with Hgb 8.3.  Patient denies SOB, CP, and denies fatigue.  Dr. Alen Blew made aware, verbal order given to not administer blood for today.  Patient aware, voiced understanding.  Dr. Alen Blew made aware of BP, ok to release and have take patient take at home medication.

## 2017-07-31 ENCOUNTER — Telehealth: Payer: Self-pay | Admitting: Oncology

## 2017-07-31 NOTE — Telephone Encounter (Signed)
Patient called in to cancel, she said she didn't need it

## 2017-08-04 ENCOUNTER — Other Ambulatory Visit: Payer: BC Managed Care – PPO

## 2017-08-06 ENCOUNTER — Telehealth: Payer: Self-pay | Admitting: Oncology

## 2017-08-06 NOTE — Telephone Encounter (Signed)
08/06/17 @ 1:25 pm spoke with patient and confirmed Disability paperwork was successfully faxed on 08/04/17 @ 11:30 am to [employer] @ [phone].

## 2017-08-11 ENCOUNTER — Inpatient Hospital Stay: Payer: BC Managed Care – PPO

## 2017-08-11 DIAGNOSIS — D469 Myelodysplastic syndrome, unspecified: Secondary | ICD-10-CM

## 2017-08-11 DIAGNOSIS — Z5111 Encounter for antineoplastic chemotherapy: Secondary | ICD-10-CM | POA: Diagnosis not present

## 2017-08-11 LAB — CBC WITH DIFFERENTIAL/PLATELET
Basophils Absolute: 0.1 10*3/uL (ref 0.0–0.1)
Basophils Relative: 3 %
Eosinophils Absolute: 0 10*3/uL (ref 0.0–0.5)
Eosinophils Relative: 1 %
HCT: 22.6 % — ABNORMAL LOW (ref 34.8–46.6)
Hemoglobin: 7.5 g/dL — ABNORMAL LOW (ref 11.6–15.9)
Lymphocytes Relative: 24 %
Lymphs Abs: 0.5 10*3/uL — ABNORMAL LOW (ref 0.9–3.3)
MCH: 32.5 pg (ref 25.1–34.0)
MCHC: 33.2 g/dL (ref 31.5–36.0)
MCV: 97.8 fL (ref 79.5–101.0)
Monocytes Absolute: 0.3 10*3/uL (ref 0.1–0.9)
Monocytes Relative: 12 %
Neutro Abs: 1.3 10*3/uL — ABNORMAL LOW (ref 1.5–6.5)
Neutrophils Relative %: 60 %
Platelets: 248 10*3/uL (ref 145–400)
RBC: 2.31 MIL/uL — ABNORMAL LOW (ref 3.70–5.45)
RDW: 25.1 % — ABNORMAL HIGH (ref 11.2–16.1)
WBC: 2.1 10*3/uL — ABNORMAL LOW (ref 3.9–10.3)

## 2017-08-11 LAB — COMPREHENSIVE METABOLIC PANEL
ALT: 28 U/L (ref 0–55)
AST: 31 U/L (ref 5–34)
Albumin: 3.8 g/dL (ref 3.5–5.0)
Alkaline Phosphatase: 134 U/L (ref 40–150)
Anion gap: 10 (ref 3–11)
BUN: 20 mg/dL (ref 7–26)
CO2: 25 mmol/L (ref 22–29)
Calcium: 9.6 mg/dL (ref 8.4–10.4)
Chloride: 104 mmol/L (ref 98–109)
Creatinine, Ser: 0.95 mg/dL (ref 0.60–1.10)
GFR calc Af Amer: >60 mL/min (ref >60)
GFR calc non Af Amer: >60 mL/min (ref >60)
Glucose, Bld: 172 mg/dL — ABNORMAL HIGH (ref 70–140)
Potassium: 4.1 mmol/L (ref 3.3–4.7)
Sodium: 139 mmol/L (ref 136–145)
Total Bilirubin: 1.6 mg/dL — ABNORMAL HIGH (ref 0.2–1.2)
Total Protein: 7.5 g/dL (ref 6.4–8.3)

## 2017-08-11 LAB — SAMPLE TO BLOOD BANK

## 2017-08-11 NOTE — Progress Notes (Signed)
Reviewed labs with Dr. Alen Blew. Patient states, "I feel great. I don't really want a transfusion today."  Dr. Alen Blew aware and advises ok to hold transfusion. Educated patient on the importance of notifying Dr. Hazeline Junker office if she begins to experience chest pain, dyspnea, weakness, or other concerning symptoms. Verbalizes understanding.

## 2017-08-18 ENCOUNTER — Encounter: Payer: Self-pay | Admitting: *Deleted

## 2017-08-18 ENCOUNTER — Inpatient Hospital Stay: Payer: BC Managed Care – PPO

## 2017-08-18 ENCOUNTER — Other Ambulatory Visit: Payer: Self-pay | Admitting: *Deleted

## 2017-08-18 ENCOUNTER — Inpatient Hospital Stay: Payer: BC Managed Care – PPO | Attending: Oncology | Admitting: Oncology

## 2017-08-18 VITALS — BP 169/53 | HR 68 | Temp 98.8°F | Resp 16

## 2017-08-18 VITALS — BP 156/42 | HR 70 | Temp 98.0°F | Resp 20 | Ht 64.0 in | Wt 215.5 lb

## 2017-08-18 DIAGNOSIS — I350 Nonrheumatic aortic (valve) stenosis: Secondary | ICD-10-CM | POA: Diagnosis not present

## 2017-08-18 DIAGNOSIS — Z85038 Personal history of other malignant neoplasm of large intestine: Secondary | ICD-10-CM

## 2017-08-18 DIAGNOSIS — D469 Myelodysplastic syndrome, unspecified: Secondary | ICD-10-CM

## 2017-08-18 DIAGNOSIS — I10 Essential (primary) hypertension: Secondary | ICD-10-CM | POA: Diagnosis not present

## 2017-08-18 DIAGNOSIS — R600 Localized edema: Secondary | ICD-10-CM

## 2017-08-18 DIAGNOSIS — D631 Anemia in chronic kidney disease: Secondary | ICD-10-CM

## 2017-08-18 DIAGNOSIS — D638 Anemia in other chronic diseases classified elsewhere: Secondary | ICD-10-CM | POA: Diagnosis not present

## 2017-08-18 DIAGNOSIS — Z5111 Encounter for antineoplastic chemotherapy: Secondary | ICD-10-CM | POA: Insufficient documentation

## 2017-08-18 DIAGNOSIS — D509 Iron deficiency anemia, unspecified: Secondary | ICD-10-CM

## 2017-08-18 DIAGNOSIS — N189 Chronic kidney disease, unspecified: Secondary | ICD-10-CM

## 2017-08-18 DIAGNOSIS — E119 Type 2 diabetes mellitus without complications: Secondary | ICD-10-CM

## 2017-08-18 LAB — CBC WITH DIFFERENTIAL/PLATELET
Basophils Absolute: 0.1 10*3/uL (ref 0.0–0.1)
Basophils Relative: 4 %
Eosinophils Absolute: 0 10*3/uL (ref 0.0–0.5)
Eosinophils Relative: 1 %
HCT: 20.4 % — ABNORMAL LOW (ref 34.8–46.6)
Hemoglobin: 6.9 g/dL — CL (ref 11.6–15.9)
Lymphocytes Relative: 24 %
Lymphs Abs: 0.7 10*3/uL — ABNORMAL LOW (ref 0.9–3.3)
MCH: 33.8 pg (ref 25.1–34.0)
MCHC: 33.8 g/dL (ref 31.5–36.0)
MCV: 100 fL (ref 79.5–101.0)
Monocytes Absolute: 0.3 10*3/uL (ref 0.1–0.9)
Monocytes Relative: 10 %
Neutro Abs: 1.9 10*3/uL (ref 1.5–6.5)
Neutrophils Relative %: 61 %
Platelets: 384 10*3/uL (ref 145–400)
RBC: 2.04 MIL/uL — ABNORMAL LOW (ref 3.70–5.45)
RDW: 25.7 % — ABNORMAL HIGH (ref 11.2–14.5)
WBC: 3 10*3/uL — ABNORMAL LOW (ref 3.9–10.3)

## 2017-08-18 LAB — COMPREHENSIVE METABOLIC PANEL
ALT: 26 U/L (ref 0–55)
AST: 32 U/L (ref 5–34)
Albumin: 3.7 g/dL (ref 3.5–5.0)
Alkaline Phosphatase: 112 U/L (ref 40–150)
Anion gap: 14 — ABNORMAL HIGH (ref 3–11)
BUN: 31 mg/dL — ABNORMAL HIGH (ref 7–26)
CO2: 22 mmol/L (ref 22–29)
Calcium: 9.2 mg/dL (ref 8.4–10.4)
Chloride: 101 mmol/L (ref 98–109)
Creatinine, Ser: 1.39 mg/dL — ABNORMAL HIGH (ref 0.60–1.10)
GFR calc Af Amer: 46 mL/min — ABNORMAL LOW (ref >60)
GFR calc non Af Amer: 40 mL/min — ABNORMAL LOW (ref >60)
Glucose, Bld: 153 mg/dL — ABNORMAL HIGH (ref 70–140)
Potassium: 3.8 mmol/L (ref 3.5–5.1)
Sodium: 137 mmol/L (ref 136–145)
Total Bilirubin: 0.7 mg/dL (ref 0.2–1.2)
Total Protein: 7 g/dL (ref 6.4–8.3)

## 2017-08-18 LAB — PREPARE RBC (CROSSMATCH)

## 2017-08-18 MED ORDER — ACETAMINOPHEN 325 MG PO TABS
ORAL_TABLET | ORAL | Status: AC
  Filled 2017-08-18: qty 2

## 2017-08-18 MED ORDER — DIPHENHYDRAMINE HCL 25 MG PO CAPS
ORAL_CAPSULE | ORAL | Status: AC
  Filled 2017-08-18: qty 1

## 2017-08-18 MED ORDER — ONDANSETRON HCL 8 MG PO TABS
ORAL_TABLET | ORAL | Status: AC
  Filled 2017-08-18: qty 1

## 2017-08-18 MED ORDER — ONDANSETRON HCL 8 MG PO TABS
8.0000 mg | ORAL_TABLET | Freq: Once | ORAL | Status: AC
  Administered 2017-08-18: 8 mg via ORAL

## 2017-08-18 MED ORDER — DIPHENHYDRAMINE HCL 25 MG PO CAPS
25.0000 mg | ORAL_CAPSULE | Freq: Once | ORAL | Status: AC
  Administered 2017-08-18: 25 mg via ORAL

## 2017-08-18 MED ORDER — AZACITIDINE CHEMO SQ INJECTION
72.0000 mg/m2 | Freq: Once | INTRAMUSCULAR | Status: AC
  Administered 2017-08-18: 150 mg via SUBCUTANEOUS
  Filled 2017-08-18: qty 6

## 2017-08-18 MED ORDER — ACETAMINOPHEN 325 MG PO TABS
650.0000 mg | ORAL_TABLET | Freq: Once | ORAL | Status: AC
  Administered 2017-08-18: 650 mg via ORAL

## 2017-08-18 MED ORDER — SODIUM CHLORIDE 0.9% FLUSH
10.0000 mL | INTRAVENOUS | Status: DC | PRN
  Filled 2017-08-18: qty 10

## 2017-08-18 NOTE — Progress Notes (Signed)
Hematology and Oncology Follow Up Visit  Shayleigh Bouldin [id number] 06-01-1956 62 y.o. 08/18/2017 9:40 AM   Principle Diagnosis: 62 year old with:  1.  Colon cancer diagnosed in April 2018 at that time she was found to have stage T3 N1.  She completed therapy in 2018 without any evidence of recurrent disease since that time.  2. 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 Her IPSS-R score was calculated and based on her characteristics she has a score of 2.5.    Prior Therapy: She is S/P hemicolectomy done in 11/2006. 1/13 lymph nodes are positive.  This was followed by 12 cycles of FOLFOX completed in 05/2007  Current therapy: 5-azacytidine at 75 mg/m square subcutaneously started in October 2018.  She completed 3 cycles of therapy. She is here for evaluation before cycle 4.  Interim History:  Mrs. is here for a follow-up with her husband.  She reports feeling reasonably fair since the last visit.  She denies any complications related to [place].  She denied any nausea, constipation or injection related issues.  She reports her Port-A-Cath has not been functioning properly and has not been used.  She denied any erythema or pain associated with it.  Her energy remains reasonable and her performance status is stable.  She denies any recent hospitalizations or illnesses.  He does report grade 1 fatigue but no shortness of breath or dyspnea on exertion.  She denies any easy bruisability or petechiae.  She is unable to perform work-related duties and contemplating retirement from her job.  Her appetite is improved and gained a few pounds since the last visit without any lower extremity edema.  She she denied headaches blurry vision, syncope or seizures. She does not report any fevers, chills sweats. He does not report any chest pain, palpitation orthopnea or leg edema. She does not report any cough, wheezing or hemoptysis. She does not report any nausea, vomiting  or abdominal pain. She does not report any frequency urgency or hesitancy. She does not report any skeletal complaints without any arthralgias or myalgias.  She does not report any skin rashes or lesions.  She denies any heat or cold intolerance.  She denies any anxiety or depression.  She denies any lymphadenopathy or ecchymosis. Remaining review of systems is negative.   Medications: I have reviewed the patient's current medications.   Allergies:  Allergies  Allergen Reactions  . Ancef [Cefazolin] Itching    Severe itching- after procedure, ancef was the antibiotic.-04/02/17  . Sulfa Antibiotics Rash and Other (See Comments)    blisters     Physical Exam: Blood pressure (!) 156/42, pulse 70, temperature 98 F (36.7 C), temperature source Oral, resp. rate 20, height '5\' 4"'  (1.626 m), weight 215 lb 8 oz (97.8 kg), SpO2 100 %. ECOG: 0 General appearance: Alert, awake woman appeared without distress. Head: Normocephalic, without obvious abnormality  Oropharynx: No oral ulcers or thrush. Eyes: No scleral icterus. Lymph nodes: Cervical, supraclavicular, and axillary nodes normal. Heart: Regular rate without murmurs rubs or gallops.  Regular rhythm. Lung: Clear to auscultations in all lung fields.  No wheezes or dullness to percussion.  Chest wall examination showed her Port-A-Cath without erythema or induration. Abdomin: Soft, nontender with good bowel sounds in all 4 quadrants.  No rebound or guarding. Skin: No rashes or lesions. Musculoskeletal: No joint deformity, effusion or edema. Skin: No rashes, lesions or petechiae.   Lab Results: Lab Results  Component Value Date  WBC 3.0 (L) 08/18/2017   HGB 6.9 (LL) 08/18/2017   HCT 20.4 (L) 08/18/2017   MCV 100.0 08/18/2017   PLT 384 08/18/2017     Chemistry      Component Value Date/Time   NA 139 08/11/2017 1206   NA 132 (L) 06/10/2017 1028   NA 134 (L) 05/26/2017 0949   K 4.1 08/11/2017 1206   K 4.0 05/26/2017 0949   CL 104  08/11/2017 1206   CL 96 (L) 03/31/2012 1513   CO2 25 08/11/2017 1206   CO2 24 05/26/2017 0949   BUN 20 08/11/2017 1206   BUN 23 06/10/2017 1028   BUN 29.2 (H) 05/26/2017 0949   CREATININE 0.95 08/11/2017 1206   CREATININE 1.4 (H) 05/26/2017 0949      Component Value Date/Time   CALCIUM 9.6 08/11/2017 1206   CALCIUM 8.9 05/26/2017 0949   ALKPHOS 134 08/11/2017 1206   ALKPHOS 157 (H) 05/26/2017 0949   AST 31 08/11/2017 1206   AST 29 05/26/2017 0949   ALT 28 08/11/2017 1206   ALT 23 05/26/2017 0949   BILITOT 1.6 (H) 08/11/2017 1206   BILITOT 2.19 (H) 05/26/2017 0949       Impression and Plan:  62 year old female with the following issues:  1. MDS with multilineage dysplasia diagnosed in 2017: Her IPSS-R score is 2.5.  Bone marrow biopsy obtained on 02/01/2016 showed hypercellular marrow with the dyspoietic changes. Her cytogenetics did not reveal any specific abnormalities.   She is currently on 5-azacytidine and received 3 cycles of therapy.  The natural course of this disease was reviewed again with the patient and her husband.  Different treatment strategies were also discussed including supportive care or more aggressive chemotherapy.  After discussion today, she is agreeable to continue with the current treatment to complete 6 cycles of therapy.  After that, repeat bone marrow biopsy will be needed.  2. Stage III (T3N1) colon cancer.  She remains in remission without any evidence of recurrent disease.    3. Hypertension: Blood pressure is closer to normal range and her blood pressure overall is under control.  4.  Aortic stenosis: No recent exacerbations related to that.  No evidence of heart failure.  5.  Lower extremity edema and generalized anasarca: related to congestive heart failure from worsening anemia and aortic stenosis.  No signs or symptoms of decompensated heart failure.  6.  Diabetes: Under control without any recent exacerbations.  She is currently on  insulin.   7.  Port-A-Cath malfunction: Since she has lost a lot of weight over a period of time it appears the Port-A-Cath is malfunctioning.  We will refer her back to interventional radiology for Port-A-Cath removal for the time being.  8.  Disability: She is unable to perform work-related duties and she will likely retire from her job.  We will assist her to obtain health insurance and she will qualify given her diagnosis that is quite debilitating and requires aggressive treatment and transfusions.  9.  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  Her hemoglobin is 6.9 and she is mildly symptomatic.  We will set her up with 2 units of packed red cell transfusion.  10. Follow-up: Will be in 2 weeks to check her counts and in 4 weeks to start cycle 5.  25  minutes was spent with the patient face-to-face today.  More than 50% of time was dedicated to patient counseling, education and coordination of her multifaceted care.   Zola Button, MD 2/4/20199:40 AM

## 2017-08-18 NOTE — Progress Notes (Signed)
[place] Work  Holiday representative received referral from Futures trader for questions regarding disability and insurance.  CSW met with patient and patients husband in the infusion room to offer support and assess for needs.  Patient stated she was currently on short term disability through her employer, and also has a long term disability benefit.  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  Patient stated she would like to retire, but was concerned for insurance coverage.  CSW and patient discussed the [place] retirement benefits; which typically include insurance coverage.  CSW printed out information on state retirement benefits, and also encouraged patient to contact her HR department.  Patient stated she has an appointment next week with HR.  CSW and patient also discussed applying for Social Security Disability.  CSW provided brief education on SSD.  A person is eligible to SSD until the age of 35 when they become eligible for social security benefits.  CSW and patient discussed the servant center resources and assistance in applying for SSD.  Patient was agreeable to a referral.  CSW completed and submitted new referral.  [place] will contact patient to schedule an appointment.  CSW provided contact information and encouraged patient to call with questions or concerns.   Johnnye Lana, MSW, LCSW, OSW-C Clinical Social Worker [facility] [phone]

## 2017-08-18 NOTE — Patient Instructions (Addendum)
[place] Discharge Instructions for Patients Receiving Chemotherapy  Today you received the following chemotherapy agents Vidaza.  To help prevent nausea and vomiting after your treatment, we encourage you to take your nausea medication as directed.   If you develop nausea and vomiting that is not controlled by your nausea medication, call the clinic.   BELOW ARE SYMPTOMS THAT SHOULD BE REPORTED IMMEDIATELY:  *FEVER GREATER THAN 100.5 F  *CHILLS WITH OR WITHOUT FEVER  NAUSEA AND VOMITING THAT IS NOT CONTROLLED WITH YOUR NAUSEA MEDICATION  *UNUSUAL SHORTNESS OF BREATH  *UNUSUAL BRUISING OR BLEEDING  TENDERNESS IN MOUTH AND THROAT WITH OR WITHOUT PRESENCE OF ULCERS  *URINARY PROBLEMS  *BOWEL PROBLEMS  UNUSUAL RASH Items with * indicate a potential emergency and should be followed up as soon as possible.  Feel free to call the clinic should you have any questions or concerns. The clinic phone number is [phone].  Please show the [place] at check-in to the Emergency Department and triage nurse.    Blood Transfusion, Care After This sheet gives you information about how to care for yourself after your procedure. Your doctor may also give you more specific instructions. If you have problems or questions, contact your doctor. Follow these instructions at home:  Take over-the-counter and prescription medicines only as told by your doctor.  Go back to your normal activities as told by your doctor.  Follow instructions from your doctor about how to take care of the area where an IV tube was put into your vein (insertion site). Make sure you: ? Wash your hands with soap and water before you change your bandage (dressing). If there is no soap and water, use hand sanitizer. ? Change your bandage as told by your doctor.  Check your IV insertion site every day for signs of infection. Check for: ? More redness, swelling, or pain. ? More fluid or  blood. ? Warmth. ? Pus or a bad smell. Contact a doctor if:  You have more redness, swelling, or pain around the IV insertion site..  You have more fluid or blood coming from the IV insertion site.  Your IV insertion site feels warm to the touch.  You have pus or a bad smell coming from the IV insertion site.  Your pee (urine) turns pink, red, or brown.  You feel weak after doing your normal activities. Get help right away if:  You have signs of a serious allergic or body defense (immune) system reaction, including: ? Itchiness. ? Hives. ? Trouble breathing. ? Anxiety. ? Pain in your chest or lower back. ? Fever, flushing, and chills. ? Fast pulse. ? Rash. ? Watery poop (diarrhea). ? Throwing up (vomiting). ? Dark pee. ? Serious headache. ? Dizziness. ? Stiff neck. ? Yellow color in your face or the white parts of your eyes (jaundice). Summary  After a blood transfusion, return to your normal activities as told by your doctor.  Every day, check for signs of infection where the IV tube was put into your vein.  Some signs of infection are warm skin, more redness and pain, more fluid or blood, and pus or a bad smell where the needle went in.  Contact your doctor if you feel weak or have any unusual symptoms. This information is not intended to replace advice given to you by your health care provider. Make sure you discuss any questions you have with your health care provider. Document Released: 07/22/2014 Document Revised: 02/23/2016 Document  Reviewed: 02/23/2016 Elsevier Interactive Patient Education  2017 [employer].

## 2017-08-19 ENCOUNTER — Inpatient Hospital Stay: Payer: BC Managed Care – PPO

## 2017-08-19 VITALS — BP 162/53 | HR 66 | Temp 98.8°F | Resp 16

## 2017-08-19 DIAGNOSIS — D631 Anemia in chronic kidney disease: Secondary | ICD-10-CM

## 2017-08-19 DIAGNOSIS — N189 Chronic kidney disease, unspecified: Secondary | ICD-10-CM

## 2017-08-19 DIAGNOSIS — Z5111 Encounter for antineoplastic chemotherapy: Secondary | ICD-10-CM | POA: Diagnosis not present

## 2017-08-19 DIAGNOSIS — D469 Myelodysplastic syndrome, unspecified: Secondary | ICD-10-CM

## 2017-08-19 LAB — PREPARE RBC (CROSSMATCH)

## 2017-08-19 MED ORDER — AZACITIDINE CHEMO SQ INJECTION
72.0000 mg/m2 | Freq: Once | INTRAMUSCULAR | Status: AC
  Administered 2017-08-19: 150 mg via SUBCUTANEOUS
  Filled 2017-08-19: qty 6

## 2017-08-19 MED ORDER — ONDANSETRON HCL 8 MG PO TABS
8.0000 mg | ORAL_TABLET | Freq: Once | ORAL | Status: AC
  Administered 2017-08-19: 8 mg via ORAL

## 2017-08-19 MED ORDER — DIPHENHYDRAMINE HCL 25 MG PO CAPS
25.0000 mg | ORAL_CAPSULE | Freq: Once | ORAL | Status: AC
  Administered 2017-08-19: 25 mg via ORAL

## 2017-08-19 MED ORDER — DIPHENHYDRAMINE HCL 25 MG PO CAPS
ORAL_CAPSULE | ORAL | Status: AC
  Filled 2017-08-19: qty 1

## 2017-08-19 MED ORDER — ONDANSETRON HCL 8 MG PO TABS
ORAL_TABLET | ORAL | Status: AC
  Filled 2017-08-19: qty 1

## 2017-08-19 MED ORDER — ACETAMINOPHEN 325 MG PO TABS
650.0000 mg | ORAL_TABLET | Freq: Once | ORAL | Status: AC
  Administered 2017-08-19: 650 mg via ORAL

## 2017-08-19 MED ORDER — ACETAMINOPHEN 325 MG PO TABS
ORAL_TABLET | ORAL | Status: AC
  Filled 2017-08-19: qty 2

## 2017-08-19 MED ORDER — SODIUM CHLORIDE 0.9 % IV SOLN
250.0000 mL | Freq: Once | INTRAVENOUS | Status: AC
  Administered 2017-08-19: 250 mL via INTRAVENOUS

## 2017-08-19 NOTE — Patient Instructions (Signed)
[place] Discharge Instructions for Patients Receiving Chemotherapy  Today you received the following chemotherapy agents Vidaza.  To help prevent nausea and vomiting after your treatment, we encourage you to take your nausea medication as directed.   If you develop nausea and vomiting that is not controlled by your nausea medication, call the clinic.   BELOW ARE SYMPTOMS THAT SHOULD BE REPORTED IMMEDIATELY:  *FEVER GREATER THAN 100.5 F  *CHILLS WITH OR WITHOUT FEVER  NAUSEA AND VOMITING THAT IS NOT CONTROLLED WITH YOUR NAUSEA MEDICATION  *UNUSUAL SHORTNESS OF BREATH  *UNUSUAL BRUISING OR BLEEDING  TENDERNESS IN MOUTH AND THROAT WITH OR WITHOUT PRESENCE OF ULCERS  *URINARY PROBLEMS  *BOWEL PROBLEMS  UNUSUAL RASH Items with * indicate a potential emergency and should be followed up as soon as possible.  Feel free to call the clinic should you have any questions or concerns. The clinic phone number is [phone].  Please show the [place] at check-in to the Emergency Department and triage nurse.    Blood Transfusion, Care After This sheet gives you information about how to care for yourself after your procedure. Your doctor may also give you more specific instructions. If you have problems or questions, contact your doctor. Follow these instructions at home:  Take over-the-counter and prescription medicines only as told by your doctor.  Go back to your normal activities as told by your doctor.  Follow instructions from your doctor about how to take care of the area where an IV tube was put into your vein (insertion site). Make sure you: ? Wash your hands with soap and water before you change your bandage (dressing). If there is no soap and water, use hand sanitizer. ? Change your bandage as told by your doctor.  Check your IV insertion site every day for signs of infection. Check for: ? More redness, swelling, or pain. ? More fluid or  blood. ? Warmth. ? Pus or a bad smell. Contact a doctor if:  You have more redness, swelling, or pain around the IV insertion site..  You have more fluid or blood coming from the IV insertion site.  Your IV insertion site feels warm to the touch.  You have pus or a bad smell coming from the IV insertion site.  Your pee (urine) turns pink, red, or brown.  You feel weak after doing your normal activities. Get help right away if:  You have signs of a serious allergic or body defense (immune) system reaction, including: ? Itchiness. ? Hives. ? Trouble breathing. ? Anxiety. ? Pain in your chest or lower back. ? Fever, flushing, and chills. ? Fast pulse. ? Rash. ? Watery poop (diarrhea). ? Throwing up (vomiting). ? Dark pee. ? Serious headache. ? Dizziness. ? Stiff neck. ? Yellow color in your face or the white parts of your eyes (jaundice). Summary  After a blood transfusion, return to your normal activities as told by your doctor.  Every day, check for signs of infection where the IV tube was put into your vein.  Some signs of infection are warm skin, more redness and pain, more fluid or blood, and pus or a bad smell where the needle went in.  Contact your doctor if you feel weak or have any unusual symptoms. This information is not intended to replace advice given to you by your health care provider. Make sure you discuss any questions you have with your health care provider. Document Released: 07/22/2014 Document Revised: 02/23/2016 Document  Reviewed: 02/23/2016 Elsevier Interactive Patient Education  2017 [employer].

## 2017-08-20 ENCOUNTER — Inpatient Hospital Stay: Payer: BC Managed Care – PPO

## 2017-08-20 ENCOUNTER — Other Ambulatory Visit: Payer: Self-pay | Admitting: Oncology

## 2017-08-20 VITALS — BP 153/37 | HR 65 | Temp 98.6°F | Resp 17

## 2017-08-20 DIAGNOSIS — D469 Myelodysplastic syndrome, unspecified: Secondary | ICD-10-CM

## 2017-08-20 DIAGNOSIS — Z5111 Encounter for antineoplastic chemotherapy: Secondary | ICD-10-CM | POA: Diagnosis not present

## 2017-08-20 LAB — TYPE AND SCREEN
ABO/RH(D): A NEG
Antibody Screen: POSITIVE
Unit division: 0
Unit division: 0

## 2017-08-20 LAB — BPAM RBC
Blood Product Expiration Date: 201902152359
Blood Product Expiration Date: 201902212359
ISSUE DATE / TIME: 201902041155
ISSUE DATE / TIME: 201902051017
Unit Type and Rh: 600
Unit Type and Rh: 600

## 2017-08-20 MED ORDER — ONDANSETRON HCL 8 MG PO TABS
ORAL_TABLET | ORAL | Status: AC
  Filled 2017-08-20: qty 1

## 2017-08-20 MED ORDER — AZACITIDINE CHEMO SQ INJECTION
72.0000 mg/m2 | Freq: Once | INTRAMUSCULAR | Status: AC
  Administered 2017-08-20: 150 mg via SUBCUTANEOUS
  Filled 2017-08-20: qty 6

## 2017-08-20 MED ORDER — ONDANSETRON HCL 8 MG PO TABS
8.0000 mg | ORAL_TABLET | Freq: Once | ORAL | Status: AC
  Administered 2017-08-20: 8 mg via ORAL

## 2017-08-20 NOTE — Patient Instructions (Signed)
[facility] Discharge Instructions for Patients Receiving Chemotherapy  Today you received the following chemotherapy agents Vidaza  To help prevent nausea and vomiting after your treatment, we encourage you to take your nausea medication as directed  If you develop nausea and vomiting that is not controlled by your nausea medication, call the clinic.   BELOW ARE SYMPTOMS THAT SHOULD BE REPORTED IMMEDIATELY:  *FEVER GREATER THAN 100.5 F  *CHILLS WITH OR WITHOUT FEVER  NAUSEA AND VOMITING THAT IS NOT CONTROLLED WITH YOUR NAUSEA MEDICATION  *UNUSUAL SHORTNESS OF BREATH  *UNUSUAL BRUISING OR BLEEDING  TENDERNESS IN MOUTH AND THROAT WITH OR WITHOUT PRESENCE OF ULCERS  *URINARY PROBLEMS  *BOWEL PROBLEMS  UNUSUAL RASH Items with * indicate a potential emergency and should be followed up as soon as possible.  Feel free to call the clinic should you have any questions or concerns. The clinic phone number is [phone].  Please show the CHEMO ALERT CARD at check-in to the Emergency Department and triage nurse.   

## 2017-08-21 ENCOUNTER — Inpatient Hospital Stay: Payer: BC Managed Care – PPO

## 2017-08-21 VITALS — BP 163/34 | HR 71 | Temp 98.5°F | Resp 18

## 2017-08-21 DIAGNOSIS — Z5111 Encounter for antineoplastic chemotherapy: Secondary | ICD-10-CM | POA: Diagnosis not present

## 2017-08-21 DIAGNOSIS — D469 Myelodysplastic syndrome, unspecified: Secondary | ICD-10-CM

## 2017-08-21 MED ORDER — ONDANSETRON HCL 8 MG PO TABS
ORAL_TABLET | ORAL | Status: AC
  Filled 2017-08-21: qty 1

## 2017-08-21 MED ORDER — AZACITIDINE CHEMO SQ INJECTION
150.0000 mg | Freq: Once | INTRAMUSCULAR | Status: AC
  Administered 2017-08-21: 150 mg via SUBCUTANEOUS
  Filled 2017-08-21: qty 6

## 2017-08-21 MED ORDER — ONDANSETRON HCL 8 MG PO TABS
8.0000 mg | ORAL_TABLET | Freq: Once | ORAL | Status: AC
  Administered 2017-08-21: 8 mg via ORAL

## 2017-08-21 NOTE — Patient Instructions (Signed)
[facility] Discharge Instructions for Patients Receiving Chemotherapy  Today you received the following chemotherapy agents Vidaza  To help prevent nausea and vomiting after your treatment, we encourage you to take your nausea medication as directed  If you develop nausea and vomiting that is not controlled by your nausea medication, call the clinic.   BELOW ARE SYMPTOMS THAT SHOULD BE REPORTED IMMEDIATELY:  *FEVER GREATER THAN 100.5 F  *CHILLS WITH OR WITHOUT FEVER  NAUSEA AND VOMITING THAT IS NOT CONTROLLED WITH YOUR NAUSEA MEDICATION  *UNUSUAL SHORTNESS OF BREATH  *UNUSUAL BRUISING OR BLEEDING  TENDERNESS IN MOUTH AND THROAT WITH OR WITHOUT PRESENCE OF ULCERS  *URINARY PROBLEMS  *BOWEL PROBLEMS  UNUSUAL RASH Items with * indicate a potential emergency and should be followed up as soon as possible.  Feel free to call the clinic should you have any questions or concerns. The clinic phone number is [phone].  Please show the CHEMO ALERT CARD at check-in to the Emergency Department and triage nurse.   

## 2017-08-22 ENCOUNTER — Inpatient Hospital Stay: Payer: BC Managed Care – PPO

## 2017-08-22 VITALS — BP 176/63 | HR 72 | Temp 97.9°F | Resp 18

## 2017-08-22 DIAGNOSIS — D469 Myelodysplastic syndrome, unspecified: Secondary | ICD-10-CM

## 2017-08-22 DIAGNOSIS — Z5111 Encounter for antineoplastic chemotherapy: Secondary | ICD-10-CM | POA: Diagnosis not present

## 2017-08-22 MED ORDER — AZACITIDINE CHEMO SQ INJECTION
72.0000 mg/m2 | Freq: Once | INTRAMUSCULAR | Status: AC
  Administered 2017-08-22: 150 mg via SUBCUTANEOUS
  Filled 2017-08-22: qty 6

## 2017-08-22 MED ORDER — ONDANSETRON HCL 8 MG PO TABS
8.0000 mg | ORAL_TABLET | Freq: Once | ORAL | Status: AC
  Administered 2017-08-22: 8 mg via ORAL

## 2017-08-22 MED ORDER — ONDANSETRON HCL 8 MG PO TABS
ORAL_TABLET | ORAL | Status: AC
  Filled 2017-08-22: qty 1

## 2017-08-22 NOTE — Patient Instructions (Signed)
[facility] Discharge Instructions for Patients Receiving Chemotherapy  Today you received the following chemotherapy agents Vidaza  To help prevent nausea and vomiting after your treatment, we encourage you to take your nausea medication as directed   If you develop nausea and vomiting that is not controlled by your nausea medication, call the clinic.   BELOW ARE SYMPTOMS THAT SHOULD BE REPORTED IMMEDIATELY:  *FEVER GREATER THAN 100.5 F  *CHILLS WITH OR WITHOUT FEVER  NAUSEA AND VOMITING THAT IS NOT CONTROLLED WITH YOUR NAUSEA MEDICATION  *UNUSUAL SHORTNESS OF BREATH  *UNUSUAL BRUISING OR BLEEDING  TENDERNESS IN MOUTH AND THROAT WITH OR WITHOUT PRESENCE OF ULCERS  *URINARY PROBLEMS  *BOWEL PROBLEMS  UNUSUAL RASH Items with * indicate a potential emergency and should be followed up as soon as possible.  Feel free to call the clinic should you have any questions or concerns. The clinic phone number is [phone].  Please show the [place] at check-in to the Emergency Department and triage nurse.    Blood Transfusion, Care After This sheet gives you information about how to care for yourself after your procedure. Your doctor may also give you more specific instructions. If you have problems or questions, contact your doctor. Follow these instructions at home:  Take over-the-counter and prescription medicines only as told by your doctor.  Go back to your normal activities as told by your doctor.  Follow instructions from your doctor about how to take care of the area where an IV tube was put into your vein (insertion site). Make sure you: ? Wash your hands with soap and water before you change your bandage (dressing). If there is no soap and water, use hand sanitizer. ? Change your bandage as told by your doctor.  Check your IV insertion site every day for signs of infection. Check for: ? More redness, swelling, or pain. ? More fluid or  blood. ? Warmth. ? Pus or a bad smell. Contact a doctor if:  You have more redness, swelling, or pain around the IV insertion site..  You have more fluid or blood coming from the IV insertion site.  Your IV insertion site feels warm to the touch.  You have pus or a bad smell coming from the IV insertion site.  Your pee (urine) turns pink, red, or brown.  You feel weak after doing your normal activities. Get help right away if:  You have signs of a serious allergic or body defense (immune) system reaction, including: ? Itchiness. ? Hives. ? Trouble breathing. ? Anxiety. ? Pain in your chest or lower back. ? Fever, flushing, and chills. ? Fast pulse. ? Rash. ? Watery poop (diarrhea). ? Throwing up (vomiting). ? Dark pee. ? Serious headache. ? Dizziness. ? Stiff neck. ? Yellow color in your face or the white parts of your eyes (jaundice). Summary  After a blood transfusion, return to your normal activities as told by your doctor.  Every day, check for signs of infection where the IV tube was put into your vein.  Some signs of infection are warm skin, more redness and pain, more fluid or blood, and pus or a bad smell where the needle went in.  Contact your doctor if you feel weak or have any unusual symptoms. This information is not intended to replace advice given to you by your health care provider. Make sure you discuss any questions you have with your health care provider. Document Released: 07/22/2014 Document Revised: 02/23/2016 Document  Reviewed: 02/23/2016 Elsevier Interactive Patient Education  2017 [place].  Blood Transfusion, Care After This sheet gives you information about how to care for yourself after your procedure. Your doctor may also give you more specific instructions. If you have problems or questions, contact your doctor. Follow these instructions at home:  Take over-the-counter and prescription medicines only as told by your doctor.  Go  back to your normal activities as told by your doctor.  Follow instructions from your doctor about how to take care of the area where an IV tube was put into your vein (insertion site). Make sure you: ? Wash your hands with soap and water before you change your bandage (dressing). If there is no soap and water, use hand sanitizer. ? Change your bandage as told by your doctor.  Check your IV insertion site every day for signs of infection. Check for: ? More redness, swelling, or pain. ? More fluid or blood. ? Warmth. ? Pus or a bad smell. Contact a doctor if:  You have more redness, swelling, or pain around the IV insertion site..  You have more fluid or blood coming from the IV insertion site.  Your IV insertion site feels warm to the touch.  You have pus or a bad smell coming from the IV insertion site.  Your pee (urine) turns pink, red, or brown.  You feel weak after doing your normal activities. Get help right away if:  You have signs of a serious allergic or body defense (immune) system reaction, including: ? Itchiness. ? Hives. ? Trouble breathing. ? Anxiety. ? Pain in your chest or lower back. ? Fever, flushing, and chills. ? Fast pulse. ? Rash. ? Watery poop (diarrhea). ? Throwing up (vomiting). ? Dark pee. ? Serious headache. ? Dizziness. ? Stiff neck. ? Yellow color in your face or the white parts of your eyes (jaundice). Summary  After a blood transfusion, return to your normal activities as told by your doctor.  Every day, check for signs of infection where the IV tube was put into your vein.  Some signs of infection are warm skin, more redness and pain, more fluid or blood, and pus or a bad smell where the needle went in.  Contact your doctor if you feel weak or have any unusual symptoms. This information is not intended to replace advice given to you by your health care provider. Make sure you discuss any questions you have with your health care  provider. Document Released: 07/22/2014 Document Revised: 02/23/2016 Document Reviewed: 02/23/2016 Elsevier Interactive Patient Education  2017 [employer].

## 2017-08-28 ENCOUNTER — Telehealth: Payer: Self-pay | Admitting: General Practice

## 2017-08-28 NOTE — Telephone Encounter (Signed)
[facility] CSW Progress Notes  Call from [employer], [facility].  Patient was referred by CSW [facility] for help w disability application.  [place] has been attempting to reach patient, thus far has been unable to connect.  Asked for CSW assistance in reaching patient to determine if patient wants help filing for disability.  Thus far, [facility] has been unable to reach patient.  CSW tried patient at mobile #, left VM requesting call back.  Also informed CSW [place].    Edwyna Shell, LCSW Clinical Social Worker Phone:  [phone]

## 2017-09-02 ENCOUNTER — Other Ambulatory Visit: Payer: BC Managed Care – PPO

## 2017-09-15 ENCOUNTER — Inpatient Hospital Stay: Payer: BC Managed Care – PPO

## 2017-09-15 ENCOUNTER — Telehealth: Payer: Self-pay

## 2017-09-15 ENCOUNTER — Other Ambulatory Visit: Payer: Self-pay

## 2017-09-15 ENCOUNTER — Inpatient Hospital Stay: Payer: BC Managed Care – PPO | Attending: Oncology | Admitting: Oncology

## 2017-09-15 ENCOUNTER — Encounter: Payer: Self-pay | Admitting: Oncology

## 2017-09-15 VITALS — BP 145/47 | HR 57 | Temp 97.7°F | Resp 18 | Ht 64.0 in | Wt 222.9 lb

## 2017-09-15 DIAGNOSIS — D509 Iron deficiency anemia, unspecified: Secondary | ICD-10-CM

## 2017-09-15 DIAGNOSIS — Z85038 Personal history of other malignant neoplasm of large intestine: Secondary | ICD-10-CM

## 2017-09-15 DIAGNOSIS — Z5111 Encounter for antineoplastic chemotherapy: Secondary | ICD-10-CM | POA: Diagnosis not present

## 2017-09-15 DIAGNOSIS — D469 Myelodysplastic syndrome, unspecified: Secondary | ICD-10-CM

## 2017-09-15 DIAGNOSIS — I350 Nonrheumatic aortic (valve) stenosis: Secondary | ICD-10-CM | POA: Diagnosis not present

## 2017-09-15 DIAGNOSIS — E119 Type 2 diabetes mellitus without complications: Secondary | ICD-10-CM

## 2017-09-15 DIAGNOSIS — I10 Essential (primary) hypertension: Secondary | ICD-10-CM | POA: Diagnosis not present

## 2017-09-15 DIAGNOSIS — D638 Anemia in other chronic diseases classified elsewhere: Secondary | ICD-10-CM | POA: Diagnosis not present

## 2017-09-15 LAB — SAMPLE TO BLOOD BANK

## 2017-09-15 LAB — COMPREHENSIVE METABOLIC PANEL
ALT: 34 U/L (ref 0–55)
AST: 38 U/L — ABNORMAL HIGH (ref 5–34)
Albumin: 3.8 g/dL (ref 3.5–5.0)
Alkaline Phosphatase: 123 U/L (ref 40–150)
Anion gap: 12 — ABNORMAL HIGH (ref 3–11)
BUN: 37 mg/dL — ABNORMAL HIGH (ref 7–26)
CO2: 21 mmol/L — ABNORMAL LOW (ref 22–29)
Calcium: 9.1 mg/dL (ref 8.4–10.4)
Chloride: 95 mmol/L — ABNORMAL LOW (ref 98–109)
Creatinine, Ser: 1.33 mg/dL — ABNORMAL HIGH (ref 0.60–1.10)
GFR calc Af Amer: 49 mL/min — ABNORMAL LOW (ref >60)
GFR calc non Af Amer: 42 mL/min — ABNORMAL LOW (ref >60)
Glucose, Bld: 141 mg/dL — ABNORMAL HIGH (ref 70–140)
Potassium: 3.7 mmol/L (ref 3.5–5.1)
Sodium: 128 mmol/L — ABNORMAL LOW (ref 136–145)
Total Bilirubin: 1 mg/dL (ref 0.2–1.2)
Total Protein: 7.1 g/dL (ref 6.4–8.3)

## 2017-09-15 LAB — PREPARE RBC (CROSSMATCH)

## 2017-09-15 LAB — CBC WITH DIFFERENTIAL/PLATELET
Basophils Absolute: 0.1 10*3/uL (ref 0.0–0.1)
Basophils Relative: 4 %
Eosinophils Absolute: 0 10*3/uL (ref 0.0–0.5)
Eosinophils Relative: 1 %
HCT: 20 % — ABNORMAL LOW (ref 34.8–46.6)
Hemoglobin: 6.9 g/dL — CL (ref 11.6–15.9)
Lymphocytes Relative: 28 %
Lymphs Abs: 0.8 10*3/uL — ABNORMAL LOW (ref 0.9–3.3)
MCH: 35.6 pg — ABNORMAL HIGH (ref 25.1–34.0)
MCHC: 34.5 g/dL (ref 31.5–36.0)
MCV: 103.1 fL — ABNORMAL HIGH (ref 79.5–101.0)
Monocytes Absolute: 0.3 10*3/uL (ref 0.1–0.9)
Monocytes Relative: 10 %
Neutro Abs: 1.5 10*3/uL (ref 1.5–6.5)
Neutrophils Relative %: 57 %
Platelets: 165 10*3/uL (ref 145–400)
RBC: 1.94 MIL/uL — ABNORMAL LOW (ref 3.70–5.45)
RDW: 25.8 % — ABNORMAL HIGH (ref 11.2–14.5)
WBC: 2.7 10*3/uL — ABNORMAL LOW (ref 3.9–10.3)
nRBC: 2 /100 WBC — ABNORMAL HIGH

## 2017-09-15 MED ORDER — AZACITIDINE CHEMO SQ INJECTION
72.0000 mg/m2 | Freq: Once | INTRAMUSCULAR | Status: AC
  Administered 2017-09-15: 150 mg via SUBCUTANEOUS
  Filled 2017-09-15: qty 6

## 2017-09-15 MED ORDER — ONDANSETRON HCL 8 MG PO TABS
ORAL_TABLET | ORAL | Status: AC
  Filled 2017-09-15: qty 1

## 2017-09-15 MED ORDER — ONDANSETRON HCL 8 MG PO TABS
8.0000 mg | ORAL_TABLET | Freq: Once | ORAL | Status: AC
  Administered 2017-09-15: 8 mg via ORAL

## 2017-09-15 NOTE — Progress Notes (Signed)
OK to treat with hemoglobin today per MD [facility]

## 2017-09-15 NOTE — Progress Notes (Signed)
Hematology and Oncology Follow Up Visit  Patricia Mata [id number] 06-27-1956 62 y.o. 09/15/2017 9:01 AM   Principle Diagnosis: 62 year old with:  1.  T3 N1 colon cancer diagnosed in April 2008.  She remains in remission after completing therapy at the time.   2. 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  She presented with refractory anemia and bone marrow biopsy confirmed the diagnosis with IPSS-R score was calculated and based on her characteristics she has a score of 2.5.    Prior Therapy: She is S/P hemicolectomy done in 11/2006. 1/13 lymph nodes are positive.  This was followed by 12 cycles of FOLFOX completed in 05/2007  Current therapy: 5-azacytidine at 75 mg/m square subcutaneously started in October 2018.  She completed 4 cycles of therapy. She is here for evaluation before cycle 5.  Interim History:  Patricia Mata is in today for a follow-up visit.  She reports no major complications related to the last cycle of therapy.  She denies any nausea, fatigue or shortness of breath.  She did receive 2 units of packed red cell transfusion 4 weeks ago and has not needed any transfusions since the last cycle of therapy.  She continues to perform activities of daily living although she is very slow and unable to perform work-related duties.  She denies any easy bruising or petechiae.  She denies any hospitalization or illnesses.  She she denied headaches blurry vision, syncope or seizures. She does not report any fevers, chills sweats.  Appetite remained reasonable without any weight loss.  He does not report any chest pain, palpitation orthopnea or leg edema. She does not report any cough, wheezing or hemoptysis. She does not report any nausea, vomiting or abdominal pain. She does not report any frequency urgency or hesitancy. She does not report any skeletal complaints without any arthralgias or myalgias.  She does not report any skin rashes or lesions.  She denies any heat or cold  intolerance.  She denies any anxiety or depression.  She denies any lymphadenopathy or ecchymosis. Remaining review of systems is negative.   Medications: I have reviewed the patient's current medications.   Allergies:  Allergies  Allergen Reactions  . Ancef [Cefazolin] Itching    Severe itching- after procedure, ancef was the antibiotic.-04/02/17  . Sulfa Antibiotics Rash and Other (See Comments)    blisters     Physical Exam: Blood pressure (!) 145/47, pulse (!) 57, temperature 97.7 F (36.5 C), temperature source Oral, resp. rate 18, height '5\' 4"'  (1.626 m), weight 222 lb 14.4 oz (101.1 kg), SpO2 99 %. ECOG: 0 General appearance: Comfortable appearing woman without distress. Head: Normal without any trauma. Oropharynx: Lucas membranes are moist and pink. Eyes: Nipples are equal and round reactive to light. Lymph nodes: Cervical, supraclavicular, and axillary nodes normal. Heart: Regular rate and rhythm without murmurs rubs or gallops. Lung: Clear without any rhonchi, wheezes or dullness to percussion. Abdomin: Soft nontender not distended.  Good bowel sounds auscultated without any shifting dullness or ascites. Skin: No ecchymosis or petechiae. Musculoskeletal: Full range of motion noted in all extremities. Neurological: No motor, sensory deficits.   Lab Results: Lab Results  Component Value Date   WBC 2.7 (L) 09/15/2017   HGB 6.9 (LL) 09/15/2017   HCT 20.0 (L) 09/15/2017   MCV 103.1 (H) 09/15/2017   PLT 165 09/15/2017     Chemistry      Component Value Date/Time   NA 137 08/18/2017 0854   NA 132 (L) 06/10/2017  1028   NA 134 (L) 05/26/2017 0949   K 3.8 08/18/2017 0854   K 4.0 05/26/2017 0949   CL 101 08/18/2017 0854   CL 96 (L) 03/31/2012 1513   CO2 22 08/18/2017 0854   CO2 24 05/26/2017 0949   BUN 31 (H) 08/18/2017 0854   BUN 23 06/10/2017 1028   BUN 29.2 (H) 05/26/2017 0949   CREATININE 1.39 (H) 08/18/2017 0854   CREATININE 1.4 (H) 05/26/2017 0949       Component Value Date/Time   CALCIUM 9.2 08/18/2017 0854   CALCIUM 8.9 05/26/2017 0949   ALKPHOS 112 08/18/2017 0854   ALKPHOS 157 (H) 05/26/2017 0949   AST 32 08/18/2017 0854   AST 29 05/26/2017 0949   ALT 26 08/18/2017 0854   ALT 23 05/26/2017 0949   BILITOT 0.7 08/18/2017 0854   BILITOT 2.19 (H) 05/26/2017 0949       Impression and Plan:  62 year old female with the following issues:  1. MDS with multilineage dysplasia presented with refractory anemia in July 2017.  At that time Her IPSS-R score is 2.5.  Bone marrow biopsy obtained on 02/01/2016 showed hypercellular marrow with the dyspoietic changes.  Normal cytogenetics.  She is currently on 5-azacytidine and received 4 cycles of therapy.  The risks and benefits of continuing this therapy was reviewed today and long-term complications were discussed.  She appears to be benefiting slightly from this treatment were decrease in her transfusion needs.  After discussion today with the risks and benefits she is agreeable to continue at this time.  2. Stage III (T3N1) colon cancer.  She completed definitive therapy in 2008 without any evidence of recurrence.   3. Hypertension: Adequately controlled at this time.  4.  Aortic stenosis: No recent exacerbation or heart failure noted.  5.  Lower extremity edema and generalized anasarca: Resolved at this time.  6.  Diabetes: No recent exacerbations noted.  Under control.  7.  Disability: Because of her MDS and chronic anemia and the constant need for transfusion support in addition to her cardiac condition, she is unable to perform work-related duties.  8.  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  Her hemoglobin is 6.9 and will be transfused.  9. Follow-up: Will be in 2 weeks to check her counts and in 4 weeks to start cycle 6.  25  minutes was spent with the patient face-to-face today.  More than 50% of time was dedicated to patient counseling, education and answering questions  regarding diagnosis, prognosis and future plan of care.   Zola Button, MD 3/4/20199:01 AM

## 2017-09-15 NOTE — Telephone Encounter (Signed)
Pt scheduled in [facility] to receive 2U PRBCs. Orders placed and [facility] in Blood Bank notified of units to be administered tomorrow.

## 2017-09-15 NOTE — Patient Instructions (Signed)
[facility] Discharge Instructions for Patients Receiving Chemotherapy  Today you received the following chemotherapy agents Vidaza  To help prevent nausea and vomiting after your treatment, we encourage you to take your nausea medication as directed  If you develop nausea and vomiting that is not controlled by your nausea medication, call the clinic.   BELOW ARE SYMPTOMS THAT SHOULD BE REPORTED IMMEDIATELY:  *FEVER GREATER THAN 100.5 F  *CHILLS WITH OR WITHOUT FEVER  NAUSEA AND VOMITING THAT IS NOT CONTROLLED WITH YOUR NAUSEA MEDICATION  *UNUSUAL SHORTNESS OF BREATH  *UNUSUAL BRUISING OR BLEEDING  TENDERNESS IN MOUTH AND THROAT WITH OR WITHOUT PRESENCE OF ULCERS  *URINARY PROBLEMS  *BOWEL PROBLEMS  UNUSUAL RASH Items with * indicate a potential emergency and should be followed up as soon as possible.  Feel free to call the clinic should you have any questions or concerns. The clinic phone number is [phone].  Please show the CHEMO ALERT CARD at check-in to the Emergency Department and triage nurse.   

## 2017-09-15 NOTE — Telephone Encounter (Signed)
Printed avs and calender of upcoming appointment. Per 3/4 los 

## 2017-09-16 ENCOUNTER — Other Ambulatory Visit: Payer: Self-pay

## 2017-09-16 ENCOUNTER — Inpatient Hospital Stay: Payer: BC Managed Care – PPO

## 2017-09-16 ENCOUNTER — Ambulatory Visit (HOSPITAL_COMMUNITY)
Admission: RE | Admit: 2017-09-16 | Discharge: 2017-09-16 | Disposition: A | Discharge status: Home / Self Care | Payer: BC Managed Care – PPO | Source: Ambulatory Visit | Attending: Oncology | Admitting: Oncology

## 2017-09-16 DIAGNOSIS — D509 Iron deficiency anemia, unspecified: Secondary | ICD-10-CM | POA: Diagnosis not present

## 2017-09-16 DIAGNOSIS — N181 Chronic kidney disease, stage 1: Secondary | ICD-10-CM

## 2017-09-16 DIAGNOSIS — D631 Anemia in chronic kidney disease: Secondary | ICD-10-CM

## 2017-09-16 DIAGNOSIS — D469 Myelodysplastic syndrome, unspecified: Secondary | ICD-10-CM

## 2017-09-16 DIAGNOSIS — Z5111 Encounter for antineoplastic chemotherapy: Secondary | ICD-10-CM | POA: Diagnosis not present

## 2017-09-16 LAB — BPAM RBC
Blood Product Expiration Date: 201903192359
Blood Product Expiration Date: 201903192359
Unit Type and Rh: 600
Unit Type and Rh: 600

## 2017-09-16 LAB — TYPE AND SCREEN
ABO/RH(D): A NEG
Antibody Screen: NEGATIVE
Unit division: 0
Unit division: 0

## 2017-09-16 LAB — PREPARE RBC (CROSSMATCH)

## 2017-09-16 LAB — SAMPLE TO BLOOD BANK

## 2017-09-16 MED ORDER — FUROSEMIDE 10 MG/ML IJ SOLN
20.0000 mg | Freq: Once | INTRAMUSCULAR | Status: AC
  Administered 2017-09-16: 20 mg via INTRAVENOUS
  Filled 2017-09-16: qty 2

## 2017-09-16 MED ORDER — AZACITIDINE CHEMO SQ INJECTION
150.0000 mg | Freq: Once | INTRAMUSCULAR | Status: AC
  Administered 2017-09-16: 150 mg via SUBCUTANEOUS
  Filled 2017-09-16: qty 6

## 2017-09-16 MED ORDER — ONDANSETRON HCL 8 MG PO TABS
8.0000 mg | ORAL_TABLET | Freq: Once | ORAL | Status: AC
  Administered 2017-09-16: 8 mg via ORAL

## 2017-09-16 MED ORDER — SODIUM CHLORIDE 0.9 % IV SOLN: 250.0000 mL | Freq: Once | INTRAVENOUS | Status: DC

## 2017-09-16 MED ORDER — ACETAMINOPHEN 325 MG PO TABS
650.0000 mg | ORAL_TABLET | Freq: Once | ORAL | Status: AC
  Administered 2017-09-16: 650 mg via ORAL
  Filled 2017-09-16: qty 2

## 2017-09-16 MED ORDER — DIPHENHYDRAMINE HCL 25 MG PO CAPS
25.0000 mg | ORAL_CAPSULE | Freq: Once | ORAL | Status: AC
  Administered 2017-09-16: 25 mg via ORAL
  Filled 2017-09-16: qty 1

## 2017-09-16 MED ORDER — ONDANSETRON HCL 8 MG PO TABS
ORAL_TABLET | ORAL | Status: AC
  Filled 2017-09-16: qty 1

## 2017-09-16 MED ORDER — HEPARIN SOD (PORK) LOCK FLUSH 100 UNIT/ML IV SOLN: 500.0000 [IU] | Freq: Every day | INTRAVENOUS | Status: DC | PRN

## 2017-09-16 NOTE — Progress Notes (Signed)
Pt was not aware that she needed to keep her blue band blood bracelet on for blood transfusion this morning. Pt does not have it with her today. Notified blood bank and lab. Pt will need a new type and screen and sample to blood bank after injection appt today. Pt will be receiving blood transfusion at [facility] today. Will notify Dr.Shadad's nurse to re enter blood bank orders.

## 2017-09-16 NOTE — Patient Instructions (Signed)
[facility] Discharge Instructions for Patients Receiving Chemotherapy  Today you received the following chemotherapy agents Vidaza  To help prevent nausea and vomiting after your treatment, we encourage you to take your nausea medication as directed  If you develop nausea and vomiting that is not controlled by your nausea medication, call the clinic.   BELOW ARE SYMPTOMS THAT SHOULD BE REPORTED IMMEDIATELY:  *FEVER GREATER THAN 100.5 F  *CHILLS WITH OR WITHOUT FEVER  NAUSEA AND VOMITING THAT IS NOT CONTROLLED WITH YOUR NAUSEA MEDICATION  *UNUSUAL SHORTNESS OF BREATH  *UNUSUAL BRUISING OR BLEEDING  TENDERNESS IN MOUTH AND THROAT WITH OR WITHOUT PRESENCE OF ULCERS  *URINARY PROBLEMS  *BOWEL PROBLEMS  UNUSUAL RASH Items with * indicate a potential emergency and should be followed up as soon as possible.  Feel free to call the clinic should you have any questions or concerns. The clinic phone number is [phone].  Please show the CHEMO ALERT CARD at check-in to the Emergency Department and triage nurse.   

## 2017-09-16 NOTE — Progress Notes (Signed)
Pt received 2 units PRBC at [facility] today without complication.  20mg  IV Lasix given during second unit for elevated BP as ordered verbally by Dr. Alen Blew and charted on [facility].  Difficulty scanning pt's armband. Double checked with patient and 2 RNs each time identification was necessary - correct patient identified.  Pt discharged in stable, ambulatory condition accompanied by husband.  Coolidge Breeze, RN 09/16/2017

## 2017-09-16 NOTE — Discharge Instructions (Signed)
Blood Transfusion, Adult, Care After This sheet gives you information about how to care for yourself after your procedure. Your health care provider may also give you more specific instructions. If you have problems or questions, contact your health care provider. What can I expect after the procedure? After your procedure, it is common to have:  Bruising and soreness where the IV tube was inserted.  Headache.  Follow these instructions at home:  Take over-the-counter and prescription medicines only as told by your health care provider.  Return to your normal activities as told by your health care provider.  Follow instructions from your health care provider about how to take care of your IV insertion site. Make sure you: ? Wash your hands with soap and water before you change your bandage (dressing). If soap and water are not available, use hand sanitizer. ? Change your dressing as told by your health care provider.  Check your IV insertion site every day for signs of infection. Check for: ? More redness, swelling, or pain. ? More fluid or blood. ? Warmth. ? Pus or a bad smell. Contact a health care provider if:  You have more redness, swelling, or pain around the IV insertion site.  You have more fluid or blood coming from the IV insertion site.  Your IV insertion site feels warm to the touch.  You have pus or a bad smell coming from the IV insertion site.  Your urine turns pink, red, or brown.  You feel weak after doing your normal activities. Get help right away if:  You have signs of a serious allergic or immune system reaction, including: ? Itchiness. ? Hives. ? Trouble breathing. ? Anxiety. ? Chest or lower back pain. ? Fever, flushing, and chills. ? Rapid pulse. ? Rash. ? Diarrhea. ? Vomiting. ? Dark urine. ? Serious headache. ? Dizziness. ? Stiff neck. ? Yellow coloration of the face or the white parts of the eyes (jaundice). This information is not  intended to replace advice given to you by your health care provider. Make sure you discuss any questions you have with your health care provider. Document Released: 07/22/2014 Document Revised: 02/28/2016 Document Reviewed: 01/15/2016 Elsevier Interactive Patient Education  2018 [employer]  

## 2017-09-17 ENCOUNTER — Inpatient Hospital Stay: Payer: BC Managed Care – PPO

## 2017-09-17 VITALS — BP 179/51 | HR 58 | Temp 97.9°F | Resp 17

## 2017-09-17 DIAGNOSIS — Z5111 Encounter for antineoplastic chemotherapy: Secondary | ICD-10-CM | POA: Diagnosis not present

## 2017-09-17 DIAGNOSIS — D469 Myelodysplastic syndrome, unspecified: Secondary | ICD-10-CM

## 2017-09-17 LAB — TYPE AND SCREEN
ABO/RH(D): A NEG
Antibody Screen: NEGATIVE
Unit division: 0
Unit division: 0

## 2017-09-17 LAB — BPAM RBC
Blood Product Expiration Date: 201903192359
Blood Product Expiration Date: 201903192359
ISSUE DATE / TIME: 201903051127
ISSUE DATE / TIME: 201903051127
Unit Type and Rh: 600
Unit Type and Rh: 600

## 2017-09-17 MED ORDER — AZACITIDINE CHEMO SQ INJECTION
72.0000 mg/m2 | Freq: Once | INTRAMUSCULAR | Status: AC
  Administered 2017-09-17: 150 mg via SUBCUTANEOUS
  Filled 2017-09-17: qty 6

## 2017-09-17 MED ORDER — ONDANSETRON HCL 8 MG PO TABS
8.0000 mg | ORAL_TABLET | Freq: Once | ORAL | Status: AC
  Administered 2017-09-17: 8 mg via ORAL

## 2017-09-17 MED ORDER — ONDANSETRON HCL 8 MG PO TABS
ORAL_TABLET | ORAL | Status: AC
  Filled 2017-09-17: qty 1

## 2017-09-17 NOTE — Patient Instructions (Signed)
[facility] Discharge Instructions for Patients Receiving Chemotherapy  Today you received the following chemotherapy agents Vidaza  To help prevent nausea and vomiting after your treatment, we encourage you to take your nausea medication  As directedIf you develop nausea and vomiting that is not controlled by your nausea medication, call the clinic.   BELOW ARE SYMPTOMS THAT SHOULD BE REPORTED IMMEDIATELY:  *FEVER GREATER THAN 100.5 F  *CHILLS WITH OR WITHOUT FEVER  NAUSEA AND VOMITING THAT IS NOT CONTROLLED WITH YOUR NAUSEA MEDICATION  *UNUSUAL SHORTNESS OF BREATH  *UNUSUAL BRUISING OR BLEEDING  TENDERNESS IN MOUTH AND THROAT WITH OR WITHOUT PRESENCE OF ULCERS  *URINARY PROBLEMS  *BOWEL PROBLEMS  UNUSUAL RASH Items with * indicate a potential emergency and should be followed up as soon as possible.  Feel free to call the clinic should you have any questions or concerns. The clinic phone number is [phone].  Please show the [place] at check-in to the Emergency Department and triage nurse.

## 2017-09-18 ENCOUNTER — Telehealth: Payer: Self-pay | Admitting: Oncology

## 2017-09-18 ENCOUNTER — Inpatient Hospital Stay: Payer: BC Managed Care – PPO

## 2017-09-18 VITALS — BP 151/55 | HR 64 | Temp 97.6°F | Resp 18

## 2017-09-18 DIAGNOSIS — Z5111 Encounter for antineoplastic chemotherapy: Secondary | ICD-10-CM | POA: Diagnosis not present

## 2017-09-18 DIAGNOSIS — D469 Myelodysplastic syndrome, unspecified: Secondary | ICD-10-CM

## 2017-09-18 MED ORDER — AZACITIDINE CHEMO SQ INJECTION
72.0000 mg/m2 | Freq: Once | INTRAMUSCULAR | Status: AC
  Administered 2017-09-18: 150 mg via SUBCUTANEOUS
  Filled 2017-09-18: qty 6

## 2017-09-18 MED ORDER — ONDANSETRON HCL 8 MG PO TABS
ORAL_TABLET | ORAL | Status: AC
  Filled 2017-09-18: qty 1

## 2017-09-18 MED ORDER — ONDANSETRON HCL 8 MG PO TABS
8.0000 mg | ORAL_TABLET | Freq: Once | ORAL | Status: AC
  Administered 2017-09-18: 8 mg via ORAL

## 2017-09-18 NOTE — Telephone Encounter (Signed)
Spoke with patient to confirm Disability paperwork has been successfully faxed to [employer] @ [phone] @ 6:33 am.  Patient requested a copy be sent for personal records.

## 2017-09-18 NOTE — Patient Instructions (Signed)
[facility] Discharge Instructions for Patients Receiving Chemotherapy  Today you received the following chemotherapy agent: Vidaza  To help prevent nausea and vomiting after your treatment, we encourage you to take your nausea medication  As directedIf you develop nausea and vomiting that is not controlled by your nausea medication, call the clinic.   BELOW ARE SYMPTOMS THAT SHOULD BE REPORTED IMMEDIATELY:  *FEVER GREATER THAN 100.5 F  *CHILLS WITH OR WITHOUT FEVER  NAUSEA AND VOMITING THAT IS NOT CONTROLLED WITH YOUR NAUSEA MEDICATION  *UNUSUAL SHORTNESS OF BREATH  *UNUSUAL BRUISING OR BLEEDING  TENDERNESS IN MOUTH AND THROAT WITH OR WITHOUT PRESENCE OF ULCERS  *URINARY PROBLEMS  *BOWEL PROBLEMS  UNUSUAL RASH Items with * indicate a potential emergency and should be followed up as soon as possible.  Feel free to call the clinic should you have any questions or concerns. The clinic phone number is [phone].  Please show the [place] at check-in to the Emergency Department and triage nurse.

## 2017-09-19 ENCOUNTER — Inpatient Hospital Stay: Payer: BC Managed Care – PPO

## 2017-09-19 VITALS — BP 159/48 | HR 62 | Temp 97.9°F | Resp 16

## 2017-09-19 DIAGNOSIS — D469 Myelodysplastic syndrome, unspecified: Secondary | ICD-10-CM

## 2017-09-19 DIAGNOSIS — Z5111 Encounter for antineoplastic chemotherapy: Secondary | ICD-10-CM | POA: Diagnosis not present

## 2017-09-19 MED ORDER — ONDANSETRON HCL 8 MG PO TABS
8.0000 mg | ORAL_TABLET | Freq: Once | ORAL | Status: AC
  Administered 2017-09-19: 8 mg via ORAL

## 2017-09-19 MED ORDER — AZACITIDINE CHEMO SQ INJECTION
72.0000 mg/m2 | Freq: Once | INTRAMUSCULAR | Status: AC
  Administered 2017-09-19: 150 mg via SUBCUTANEOUS
  Filled 2017-09-19: qty 6

## 2017-09-19 MED ORDER — ONDANSETRON HCL 8 MG PO TABS
ORAL_TABLET | ORAL | Status: AC
  Filled 2017-09-19: qty 1

## 2017-09-19 NOTE — Patient Instructions (Signed)
[facility] Discharge Instructions for Patients Receiving Chemotherapy  Today you received the following chemotherapy agents Vidaza  To help prevent nausea and vomiting after your treatment, we encourage you to take your nausea medication as directed  If you develop nausea and vomiting that is not controlled by your nausea medication, call the clinic.   BELOW ARE SYMPTOMS THAT SHOULD BE REPORTED IMMEDIATELY:  *FEVER GREATER THAN 100.5 F  *CHILLS WITH OR WITHOUT FEVER  NAUSEA AND VOMITING THAT IS NOT CONTROLLED WITH YOUR NAUSEA MEDICATION  *UNUSUAL SHORTNESS OF BREATH  *UNUSUAL BRUISING OR BLEEDING  TENDERNESS IN MOUTH AND THROAT WITH OR WITHOUT PRESENCE OF ULCERS  *URINARY PROBLEMS  *BOWEL PROBLEMS  UNUSUAL RASH Items with * indicate a potential emergency and should be followed up as soon as possible.  Feel free to call the clinic should you have any questions or concerns. The clinic phone number is [phone].  Please show the CHEMO ALERT CARD at check-in to the Emergency Department and triage nurse.   

## 2017-09-30 ENCOUNTER — Inpatient Hospital Stay: Payer: BC Managed Care – PPO

## 2017-09-30 DIAGNOSIS — Z5111 Encounter for antineoplastic chemotherapy: Secondary | ICD-10-CM | POA: Diagnosis not present

## 2017-09-30 DIAGNOSIS — D469 Myelodysplastic syndrome, unspecified: Secondary | ICD-10-CM

## 2017-09-30 LAB — CBC WITH DIFFERENTIAL/PLATELET
Basophils Absolute: 0.1 10*3/uL (ref 0.0–0.1)
Basophils Relative: 3 %
Eosinophils Absolute: 0.1 10*3/uL (ref 0.0–0.5)
Eosinophils Relative: 4 %
HCT: 21.7 % — ABNORMAL LOW (ref 34.8–46.6)
Hemoglobin: 7.2 g/dL — ABNORMAL LOW (ref 11.6–15.9)
Lymphocytes Relative: 21 %
Lymphs Abs: 0.4 10*3/uL — ABNORMAL LOW (ref 0.9–3.3)
MCH: 35 pg — ABNORMAL HIGH (ref 25.1–34.0)
MCHC: 33.1 g/dL (ref 31.5–36.0)
MCV: 105.8 fL — ABNORMAL HIGH (ref 79.5–101.0)
Monocytes Absolute: 0.2 10*3/uL (ref 0.1–0.9)
Monocytes Relative: 11 %
Neutro Abs: 1.2 10*3/uL — ABNORMAL LOW (ref 1.5–6.5)
Neutrophils Relative %: 61 %
Platelets: 121 10*3/uL — ABNORMAL LOW (ref 145–400)
RBC: 2.05 MIL/uL — ABNORMAL LOW (ref 3.70–5.45)
RDW: 30.4 % — ABNORMAL HIGH (ref 11.2–14.5)
WBC: 2 10*3/uL — ABNORMAL LOW (ref 3.9–10.3)

## 2017-09-30 LAB — COMPREHENSIVE METABOLIC PANEL
ALT: 31 U/L (ref 0–55)
AST: 45 U/L — ABNORMAL HIGH (ref 5–34)
Albumin: 3.7 g/dL (ref 3.5–5.0)
Alkaline Phosphatase: 139 U/L (ref 40–150)
Anion gap: 10 (ref 3–11)
BUN: 33 mg/dL — ABNORMAL HIGH (ref 7–26)
CO2: 26 mmol/L (ref 22–29)
Calcium: 9.3 mg/dL (ref 8.4–10.4)
Chloride: 101 mmol/L (ref 98–109)
Creatinine, Ser: 1.41 mg/dL — ABNORMAL HIGH (ref 0.60–1.10)
GFR calc Af Amer: 45 mL/min — ABNORMAL LOW (ref >60)
GFR calc non Af Amer: 39 mL/min — ABNORMAL LOW (ref >60)
Glucose, Bld: 182 mg/dL — ABNORMAL HIGH (ref 70–140)
Potassium: 4.4 mmol/L (ref 3.5–5.1)
Sodium: 137 mmol/L (ref 136–145)
Total Bilirubin: 1 mg/dL (ref 0.2–1.2)
Total Protein: 7.2 g/dL (ref 6.4–8.3)

## 2017-09-30 LAB — SAMPLE TO BLOOD BANK

## 2017-09-30 NOTE — Progress Notes (Signed)
Hemoglobin 7.2 today. Patient doesn't feel like she needs blood today. She verbalized understanding to call [facility] when she needs labs checked again. Will notify infusion nurse. MD aware.

## 2017-10-13 ENCOUNTER — Inpatient Hospital Stay: Payer: BC Managed Care – PPO

## 2017-10-13 ENCOUNTER — Inpatient Hospital Stay: Payer: BC Managed Care – PPO | Attending: Oncology | Admitting: Oncology

## 2017-10-13 ENCOUNTER — Other Ambulatory Visit: Payer: Self-pay | Admitting: *Deleted

## 2017-10-13 ENCOUNTER — Telehealth: Payer: Self-pay | Admitting: Oncology

## 2017-10-13 VITALS — BP 161/58 | HR 62 | Temp 98.6°F | Resp 18 | Ht 64.0 in | Wt 216.8 lb

## 2017-10-13 VITALS — BP 174/56 | HR 62 | Temp 98.6°F | Resp 18

## 2017-10-13 DIAGNOSIS — D631 Anemia in chronic kidney disease: Secondary | ICD-10-CM | POA: Insufficient documentation

## 2017-10-13 DIAGNOSIS — D469 Myelodysplastic syndrome, unspecified: Secondary | ICD-10-CM | POA: Diagnosis present

## 2017-10-13 DIAGNOSIS — E1122 Type 2 diabetes mellitus with diabetic chronic kidney disease: Secondary | ICD-10-CM | POA: Insufficient documentation

## 2017-10-13 DIAGNOSIS — D638 Anemia in other chronic diseases classified elsewhere: Secondary | ICD-10-CM

## 2017-10-13 DIAGNOSIS — B029 Zoster without complications: Secondary | ICD-10-CM | POA: Insufficient documentation

## 2017-10-13 DIAGNOSIS — Z5111 Encounter for antineoplastic chemotherapy: Secondary | ICD-10-CM | POA: Diagnosis present

## 2017-10-13 DIAGNOSIS — I10 Essential (primary) hypertension: Secondary | ICD-10-CM

## 2017-10-13 DIAGNOSIS — D709 Neutropenia, unspecified: Secondary | ICD-10-CM | POA: Diagnosis not present

## 2017-10-13 DIAGNOSIS — D508 Other iron deficiency anemias: Secondary | ICD-10-CM

## 2017-10-13 DIAGNOSIS — N189 Chronic kidney disease, unspecified: Secondary | ICD-10-CM | POA: Diagnosis not present

## 2017-10-13 DIAGNOSIS — Z85038 Personal history of other malignant neoplasm of large intestine: Secondary | ICD-10-CM

## 2017-10-13 DIAGNOSIS — E119 Type 2 diabetes mellitus without complications: Secondary | ICD-10-CM

## 2017-10-13 DIAGNOSIS — I129 Hypertensive chronic kidney disease with stage 1 through stage 4 chronic kidney disease, or unspecified chronic kidney disease: Secondary | ICD-10-CM | POA: Diagnosis not present

## 2017-10-13 DIAGNOSIS — I350 Nonrheumatic aortic (valve) stenosis: Secondary | ICD-10-CM | POA: Diagnosis not present

## 2017-10-13 LAB — COMPREHENSIVE METABOLIC PANEL
ALT: 28 U/L (ref 0–55)
AST: 36 U/L — ABNORMAL HIGH (ref 5–34)
Albumin: 3.8 g/dL (ref 3.5–5.0)
Alkaline Phosphatase: 118 U/L (ref 40–150)
Anion gap: 9 (ref 3–11)
BUN: 32 mg/dL — ABNORMAL HIGH (ref 7–26)
CO2: 25 mmol/L (ref 22–29)
Calcium: 9.4 mg/dL (ref 8.4–10.4)
Chloride: 101 mmol/L (ref 98–109)
Creatinine, Ser: 1.26 mg/dL — ABNORMAL HIGH (ref 0.60–1.10)
GFR calc Af Amer: 52 mL/min — ABNORMAL LOW (ref >60)
GFR calc non Af Amer: 45 mL/min — ABNORMAL LOW (ref >60)
Glucose, Bld: 208 mg/dL — ABNORMAL HIGH (ref 70–140)
Potassium: 4.4 mmol/L (ref 3.5–5.1)
Sodium: 135 mmol/L — ABNORMAL LOW (ref 136–145)
Total Bilirubin: 1.1 mg/dL (ref 0.2–1.2)
Total Protein: 7 g/dL (ref 6.4–8.3)

## 2017-10-13 LAB — CBC WITH DIFFERENTIAL/PLATELET
Basophils Absolute: 0.1 10*3/uL (ref 0.0–0.1)
Basophils Relative: 5 %
Eosinophils Absolute: 0 10*3/uL (ref 0.0–0.5)
Eosinophils Relative: 1 %
HCT: 21.1 % — ABNORMAL LOW (ref 34.8–46.6)
Hemoglobin: 7.1 g/dL — ABNORMAL LOW (ref 11.6–15.9)
Lymphocytes Relative: 19 %
Lymphs Abs: 0.3 10*3/uL — ABNORMAL LOW (ref 0.9–3.3)
MCH: 36.5 pg — ABNORMAL HIGH (ref 25.1–34.0)
MCHC: 33.4 g/dL (ref 31.5–36.0)
MCV: 109.3 fL — ABNORMAL HIGH (ref 79.5–101.0)
Monocytes Absolute: 0.1 10*3/uL (ref 0.1–0.9)
Monocytes Relative: 9 %
Neutro Abs: 1.1 10*3/uL — ABNORMAL LOW (ref 1.5–6.5)
Neutrophils Relative %: 66 %
Platelets: 272 10*3/uL (ref 145–400)
RBC: 1.93 MIL/uL — ABNORMAL LOW (ref 3.70–5.45)
RDW: 31.3 % — ABNORMAL HIGH (ref 11.2–14.5)
WBC: 1.7 10*3/uL — ABNORMAL LOW (ref 3.9–10.3)

## 2017-10-13 LAB — SAMPLE TO BLOOD BANK

## 2017-10-13 LAB — PREPARE RBC (CROSSMATCH)

## 2017-10-13 MED ORDER — DIPHENHYDRAMINE HCL 25 MG PO CAPS
ORAL_CAPSULE | ORAL | Status: AC
  Filled 2017-10-13: qty 1

## 2017-10-13 MED ORDER — AZACITIDINE CHEMO SQ INJECTION
150.0000 mg | Freq: Once | INTRAMUSCULAR | Status: AC
  Administered 2017-10-13: 150 mg via SUBCUTANEOUS
  Filled 2017-10-13: qty 6

## 2017-10-13 MED ORDER — DIPHENHYDRAMINE HCL 25 MG PO CAPS
25.0000 mg | ORAL_CAPSULE | Freq: Once | ORAL | Status: AC
  Administered 2017-10-13: 25 mg via ORAL

## 2017-10-13 MED ORDER — ONDANSETRON HCL 8 MG PO TABS
8.0000 mg | ORAL_TABLET | Freq: Once | ORAL | Status: AC
  Administered 2017-10-13: 8 mg via ORAL

## 2017-10-13 MED ORDER — ACETAMINOPHEN 325 MG PO TABS
ORAL_TABLET | ORAL | Status: AC
  Filled 2017-10-13: qty 2

## 2017-10-13 MED ORDER — ONDANSETRON HCL 8 MG PO TABS
ORAL_TABLET | ORAL | Status: AC
  Filled 2017-10-13: qty 1

## 2017-10-13 MED ORDER — ACETAMINOPHEN 325 MG PO TABS
650.0000 mg | ORAL_TABLET | Freq: Once | ORAL | Status: AC
  Administered 2017-10-13: 650 mg via ORAL

## 2017-10-13 MED ORDER — SODIUM CHLORIDE 0.9 % IV SOLN
250.0000 mL | Freq: Once | INTRAVENOUS | Status: AC
  Administered 2017-10-13: 250 mL via INTRAVENOUS

## 2017-10-13 NOTE — Patient Instructions (Addendum)
[facility] Discharge Instructions for Patients Receiving Chemotherapy  Today you received the following chemotherapy agents Vidaza.  To help prevent nausea and vomiting after your treatment, we encourage you to take your nausea medication as directed.   If you develop nausea and vomiting that is not controlled by your nausea medication, call the clinic.   BELOW ARE SYMPTOMS THAT SHOULD BE REPORTED IMMEDIATELY:  *FEVER GREATER THAN 100.5 F  *CHILLS WITH OR WITHOUT FEVER  NAUSEA AND VOMITING THAT IS NOT CONTROLLED WITH YOUR NAUSEA MEDICATION  *UNUSUAL SHORTNESS OF BREATH  *UNUSUAL BRUISING OR BLEEDING  TENDERNESS IN MOUTH AND THROAT WITH OR WITHOUT PRESENCE OF ULCERS  *URINARY PROBLEMS  *BOWEL PROBLEMS  UNUSUAL RASH Items with * indicate a potential emergency and should be followed up as soon as possible.  Feel free to call the clinic should you have any questions or concerns. The clinic phone number is [phone].  Please show the [facility] at check-in to the Emergency Department and triage nurse.   Blood Transfusion, Adult A blood transfusion is a procedure in which you receive donated blood, including plasma, platelets, and red blood cells, through an IV tube. You may need a blood transfusion because of illness, surgery, or injury. The blood may come from a donor. You may also be able to donate blood for yourself (autologous blood donation) before a surgery if you know that you might require a blood transfusion. The blood given in a transfusion is made up of different types of cells. You may receive:  Red blood cells. These carry oxygen to the cells in the body.  White blood cells. These help you fight infections.  Platelets. These help your blood to clot.  Plasma. This is the liquid part of your blood and it helps with fluid imbalances.  If you have hemophilia or another clotting disorder, you may also receive other types of blood  products. Tell a health care provider about:  Any allergies you have.  All medicines you are taking, including vitamins, herbs, eye drops, creams, and over-the-counter medicines.  Any problems you or family members have had with anesthetic medicines.  Any blood disorders you have.  Any surgeries you have had.  Any medical conditions you have, including any recent fever or cold symptoms.  Whether you are pregnant or may be pregnant.  Any previous reactions you have had during a blood transfusion. What are the risks? Generally, this is a safe procedure. However, problems may occur, including:  Having an allergic reaction to something in the donated blood. Hives and itching may be symptoms of this type of reaction.  Fever. This may be a reaction to the white blood cells in the transfused blood. Nausea or chest pain may accompany a fever.  Iron overload. This can happen from having many transfusions.  Transfusion-related acute lung injury (TRALI). This is a rare reaction that causes lung damage. The cause is not known.TRALI can occur within hours of a transfusion or several days later.  Sudden (acute) or delayed hemolytic reactions. This happens if your blood does not match the cells in your transfusion. Your body's defense system (immune system) may try to attack the new cells. This complication is rare. The symptoms include fever, chills, nausea, and low back pain or chest pain.  Infection or disease transmission. This is rare.  What happens before the procedure?  You will have a blood test to determine your blood type. This is necessary to know what kind of  blood your body will accept and to match it to the donor blood.  If you are going to have a planned surgery, you may be able to do an autologous blood donation. This may be done in case you need to have a transfusion.  If you have had an allergic reaction to a transfusion in the past, you may be given medicine to help  prevent a reaction. This medicine may be given to you by mouth or through an IV tube.  You will have your temperature, blood pressure, and pulse monitored before the transfusion.  Follow instructions from your health care provider about eating and drinking restrictions.  Ask your health care provider about: ? Changing or stopping your regular medicines. This is especially important if you are taking diabetes medicines or blood thinners. ? Taking medicines such as aspirin and ibuprofen. These medicines can thin your blood. Do not take these medicines before your procedure if your health care provider instructs you not to. What happens during the procedure?  An IV tube will be inserted into one of your veins.  The bag of donated blood will be attached to your IV tube. The blood will then enter through your vein.  Your temperature, blood pressure, and pulse will be monitored regularly during the transfusion. This monitoring is done to detect early signs of a transfusion reaction.  If you have any signs or symptoms of a reaction, your transfusion will be stopped and you may be given medicine.  When the transfusion is complete, your IV tube will be removed.  Pressure may be applied to the IV site for a few minutes.  A bandage (dressing) will be applied. The procedure may vary among health care providers and hospitals. What happens after the procedure?  Your temperature, blood pressure, heart rate, breathing rate, and blood oxygen level will be monitored often.  Your blood may be tested to see how you are responding to the transfusion.  You may be warmed with fluids or blankets to maintain a normal body temperature. Summary  A blood transfusion is a procedure in which you receive donated blood, including plasma, platelets, and red blood cells, through an IV tube.  Your temperature, blood pressure, and pulse will be monitored before, during, and after the transfusion.  Your blood may  be tested after the transfusion to see how your body has responded. This information is not intended to replace advice given to you by your health care provider. Make sure you discuss any questions you have with your health care provider. Document Released: 06/28/2000 Document Revised: 03/28/2016 Document Reviewed: 03/28/2016 Elsevier Interactive Patient Education  2018 [employer].    Neutropenia Neutropenia is a condition that occurs when you have a lower-than-normal level of a type of white blood cell (neutrophil) in your body. Neutrophils are made in the spongy center of large bones (bone marrow) and they fight infections. Neutrophils are your body's main defense against bacterial and fungal infections. The fewer neutrophils you have and the longer your body remains without them, the greater your risk of getting a severe infection. What are the causes? This condition can occur if your body uses up or destroys neutrophils faster than your bone marrow can make them. This problem may happen because of:  Bacterial or fungal infection.  Allergic disorders.  Reactions to some medicines.  Autoimmune disease.  An enlarged spleen.  This condition can also occur if your bone marrow does not produce enough neutrophils. This problem may be caused by:  Cancer.  Cancer treatments, such as radiation or chemotherapy.  Viral infections.  Medicines, such as phenytoin.  Vitamin B12 deficiency.  Diseases of the bone marrow.  Environmental toxins, such as insecticides.  What are the signs or symptoms? This condition does not usually cause symptoms. If symptoms are present, they are usually caused by an underlying infection. Symptoms of an infection may include:  Fever.  Chills.  Swollen glands.  Oral or anal ulcers.  Cough and shortness of breath.  Rash.  Skin infection.  Fatigue.  How is this diagnosed? Your health care provider may suspect neutropenia if you have:  A  condition that may cause neutropenia.  Symptoms of infection, especially fever.  Frequent and unusual infections.  You will have a medical history and physical exam. Tests will also be done, such as:  A complete blood count (CBC).  A procedure to collect a sample of bone marrow for examination (bone marrow biopsy).  A chest X-ray.  A urine culture.  A blood culture.  How is this treated? Treatment depends on the underlying cause and severity of your condition. Mild neutropenia may not require treatment. Treatment may include medicines, such as:  Antibiotic medicine given through an IV tube.  Antiviral medicines.  Antifungal medicines.  A medicine to increase neutrophil production (colony-stimulating factor). You may get this drug through an IV tube or by injection.  Steroids given through an IV tube.  If an underlying condition is causing neutropenia, you may need treatment for that condition. If medicines you are taking are causing neutropenia, your health care provider may have you stop taking those medicines. Follow these instructions at home: Medicines  Take over-the-counter and prescription medicines only as told by your health care provider.  Get a seasonal flu shot (influenza vaccine). Lifestyle  Do not eat unpasteurized foods.Do not eat unwashed raw fruits or vegetables.  Avoid exposure to groups of people or children.  Avoid being around people who are sick.  Avoid being around dirt or dust, such as in construction areas or gardens.  Do not provide direct care for pets. Avoid animal droppings. Do not clean litter boxes and bird cages. Hygiene   Bathe daily.  Clean the area between the genitals and the anus (perineal area) after you urinate or have a bowel movement. If you are female, wipe from front to back.  Brush your teeth with a soft toothbrush before and after meals.  Do not use a razor that has a blade. Use an electric razor to remove  hair.  Wash your hands often. Make sure others who come in contact with you also wash their hands. If soap and water are not available, use hand sanitizer. General instructions  Do not have sex unless your health care provider has approved.  Take actions to avoid cuts and burns. For example: ? Be cautious when you use knives. Always cut away from yourself. ? Keep knives in protective sheaths or guards when not in use. ? Use oven mitts when you cook with a hot stove, oven, or grill. ? Stand a safe distance away from open fires.  Avoid people who received a vaccine in the past 30 days if that vaccine contained a live version of the germ (live vaccine). You should not get a live vaccine. Common live vaccines are varicella, measles, mumps, and rubella.  Do not share food utensils.  Do not use tampons, enemas, or rectal suppositories unless your health care provider has approved.  Keep all appointments as  told by your health care provider. This is important. Contact a health care provider if:  You have a fever.  You have chills or you start to shake.  You have: ? A sore throat. ? A warm, red, or tender area on your skin. ? A cough. ? Frequent or painful urination. ? Vaginal discharge or itching.  You develop: ? Sores in your mouth or anus. ? Swollen lymph nodes. ? Red streaks on the skin. ? A rash.  You feel: ? Nauseous or you vomit. ? Very fatigued. ? Short of breath. This information is not intended to replace advice given to you by your health care provider. Make sure you discuss any questions you have with your health care provider. Document Released: 12/21/2001 Document Revised: 12/07/2015 Document Reviewed: 01/11/2015 Elsevier Interactive Patient Education  [employer].

## 2017-10-13 NOTE — Telephone Encounter (Signed)
Scheduled appt per 4/1 los - Gave patient AVS and calender per los.  

## 2017-10-13 NOTE — Progress Notes (Signed)
Hematology and Oncology Follow Up Visit  Patricia Patricia Mata [id number] 13-May-1956 62 y.o. 10/13/2017 10:14 AM   Principle Diagnosis: 62 year old woman with:   1.  T3 N1 colon cancer diagnosed in April 2008.  She has no evidence of relapse at this time.  2.  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  IPSS-R score was 2.5 at the time of diagnosis.    Prior Therapy: She is S/P hemicolectomy done in 11/2006. 1/13 lymph nodes are positive.  This was followed by 12 cycles of FOLFOX completed in 05/2007  Current therapy:  5-azacytidine at 75 mg/m square subcutaneously started in October 2018.  She completed 4 cycles of therapy. She is here for evaluation before cycle 6.  Interim History:  Patricia Patricia Mata is here for a follow-up.  She reports no major changes since the last visit.  She continues to report overall improvement in her health.  She denies any recent hospitalizations or illnesses.  She denies any complications related to 5 days.  She denies any nausea, vomiting or constipation.  She denies any injection related complications but does report tenderness around the skin.  She denies any fevers of recent infections.  Her performance status and activity level remained reasonable although she does have grade 1 fatigue.  She she denied headaches blurry vision, syncope or seizures. She does not report any fevers, chills sweats. She does not report any chest pain, palpitation orthopnea or leg edema. She does not report any cough, wheezing or hemoptysis. She does not report any nausea, vomiting or abdominal pain. She does not report any frequency urgency or hesitancy. She does not report any skeletal complaints without any arthralgias or myalgias.  She does not report any skin rashes or lesions.  She denies any heat or cold intolerance.  She denies any anxiety or depression.  She denies any lymphadenopathy or easy bruisability.  Remaining review of systems is negative.   Medications: I have  reviewed the patient's current medications.   Allergies:  Allergies  Allergen Reactions  . Ancef [Cefazolin] Itching    Severe itching- after procedure, ancef was the antibiotic.-04/02/17  . Sulfa Antibiotics Rash and Other (See Comments)    blisters     Physical Exam: Blood pressure (!) 161/58, pulse 62, temperature 98.6 F (37 C), temperature source Oral, resp. rate 18, height 5\' 4"  (1.626 m), weight 216 lb 12.8 oz (98.3 kg), SpO2 100 %.   ECOG: 0 General appearance: Alert, awake woman appeared without distress. Head: Atraumatic without abnormalities. Oropharynx: No thrush or ulcers. Eyes: Sclera anicteric. Lymph nodes: No lymphadenopathy palpated. Heart: Regular rate and rhythm with systolic ejection murmur. Lung: Clear to auscultation in all lung fields without any wheezes or dullness to percussion.. Abdomin: Soft, nontender without any rebound or guarding.  No shifting dullness or ascites. Skin: No petechia or rash. Musculoskeletal: No joint deformity or effusion. Neurological: No deficits noted motor, sensory or deep tendon reflexes.   Lab Results: Lab Results  Component Value Date   WBC 1.7 (L) 10/13/2017   HGB 7.1 (L) 10/13/2017   HCT 21.1 (L) 10/13/2017   MCV 109.3 (H) 10/13/2017   PLT 272 10/13/2017     Chemistry      Component Value Date/Time   NA 137 09/30/2017 1052   NA 132 (L) 06/10/2017 1028   NA 134 (L) 05/26/2017 0949   K 4.4 09/30/2017 1052   K 4.0 05/26/2017 0949   CL 101 09/30/2017 1052   CL 96 (L) 03/31/2012 1513  CO2 26 09/30/2017 1052   CO2 24 05/26/2017 0949   BUN 33 (H) 09/30/2017 1052   BUN 23 06/10/2017 1028   BUN 29.2 (H) 05/26/2017 0949   CREATININE 1.41 (H) 09/30/2017 1052   CREATININE 1.4 (H) 05/26/2017 0949      Component Value Date/Time   CALCIUM 9.3 09/30/2017 1052   CALCIUM 8.9 05/26/2017 0949   ALKPHOS 139 09/30/2017 1052   ALKPHOS 157 (H) 05/26/2017 0949   AST 45 (H) 09/30/2017 1052   AST 29 05/26/2017 0949   ALT 31  09/30/2017 1052   ALT 23 05/26/2017 0949   BILITOT 1.0 09/30/2017 1052   BILITOT 2.19 (H) 05/26/2017 0949       Impression and Plan:  62 year old female with the following issues:  1. MDS with multilineage dysplasia with an IPSS-R score is 2.5 diagnosed on 02/01/2016.   She is currently on 5-azacytidine and received 5 cycles of therapy.  She will proceed with cycle 6 of therapy without any dose reduction or delay.  She is experiencing reasonable benefit from this treatment with decrease in her transfusion need.    Risks and benefits of continuing this treatment was reviewed today with the patient.  She has a treatment interruption in December and January and she might benefit from an additional cycle in May 2019.  After discussion today she is agreeable at this time. The plan is to proceed with cycle 7 in 4 weeks    2. Stage III (T3N1) colon cancer.  No evidence of recurrent disease at this time.   3. Hypertension: Blood pressure appears adequately controlled at this time.   4.  Aortic stenosis: No evidence of heart failure noted at this time.  Appears to be well compensated.  5.  Diabetes: Continues to be adequately controlled.  No exacerbation while she is on 5 azacitidine.  6.  Anemia: Related to myelodysplasia and treatment of her condition.  Her hemoglobin appeared stable without transfusion.  We will suppurative transfusion the next 24-48 hours.  7.  Neutropenia: Related to 5 azacitidine.  Signs and symptoms of infection were reviewed today Patricia Mata any.  Neutropenic precautions were discussed with the patient.  8. Follow-up: Will be in 2 weeks to check her counts and in 4 weeks to start cycle 7.  25  minutes was spent with the patient face-to-face today.  More than 50% of time was dedicated to patient counseling, education and coordination of care.   Zola Button, MD 4/1/201910:14 AM

## 2017-10-13 NOTE — Progress Notes (Signed)
Per Erline Levine desk RN Dr Alen Blew ok to tx today with ANC 1.1 and current labs. Pt to also receive 1 unit PRBC.

## 2017-10-13 NOTE — Progress Notes (Signed)
Per Dr. Alen Blew, it's OK to treat with today's labs. Infusion room nurse notified.

## 2017-10-13 NOTE — Addendum Note (Signed)
Addended by: Amelia Jo I on: 10/13/2017 11:09 AM   Modules accepted: Orders

## 2017-10-14 ENCOUNTER — Inpatient Hospital Stay: Payer: BC Managed Care – PPO

## 2017-10-14 VITALS — BP 165/56 | HR 55 | Temp 98.5°F | Resp 18

## 2017-10-14 DIAGNOSIS — D469 Myelodysplastic syndrome, unspecified: Secondary | ICD-10-CM

## 2017-10-14 DIAGNOSIS — Z5111 Encounter for antineoplastic chemotherapy: Secondary | ICD-10-CM | POA: Diagnosis not present

## 2017-10-14 LAB — TYPE AND SCREEN
ABO/RH(D): A NEG
Antibody Screen: NEGATIVE
Unit division: 0

## 2017-10-14 LAB — BPAM RBC
Blood Product Expiration Date: 201904272359
ISSUE DATE / TIME: 201904011450
Unit Type and Rh: 600

## 2017-10-14 MED ORDER — ONDANSETRON HCL 8 MG PO TABS
ORAL_TABLET | ORAL | Status: AC
  Filled 2017-10-14: qty 1

## 2017-10-14 MED ORDER — AZACITIDINE CHEMO SQ INJECTION
72.0000 mg/m2 | Freq: Once | INTRAMUSCULAR | Status: AC
  Administered 2017-10-14: 150 mg via SUBCUTANEOUS
  Filled 2017-10-14: qty 6

## 2017-10-14 MED ORDER — ONDANSETRON HCL 8 MG PO TABS
8.0000 mg | ORAL_TABLET | Freq: Once | ORAL | Status: AC
  Administered 2017-10-14: 8 mg via ORAL

## 2017-10-14 NOTE — Patient Instructions (Signed)
[facility] Discharge Instructions for Patients Receiving Chemotherapy  Today you received the following chemotherapy agents Vidaze  To help prevent nausea and vomiting after your treatment, we encourage you to take your nausea medication as directed   If you develop nausea and vomiting that is not controlled by your nausea medication, call the clinic.   BELOW ARE SYMPTOMS THAT SHOULD BE REPORTED IMMEDIATELY:  *FEVER GREATER THAN 100.5 F  *CHILLS WITH OR WITHOUT FEVER  NAUSEA AND VOMITING THAT IS NOT CONTROLLED WITH YOUR NAUSEA MEDICATION  *UNUSUAL SHORTNESS OF BREATH  *UNUSUAL BRUISING OR BLEEDING  TENDERNESS IN MOUTH AND THROAT WITH OR WITHOUT PRESENCE OF ULCERS  *URINARY PROBLEMS  *BOWEL PROBLEMS  UNUSUAL RASH Items with * indicate a potential emergency and should be followed up as soon as possible.  Feel free to call the clinic should you have any questions or concerns. The clinic phone number is [phone].  Please show the [facility] at check-in to the Emergency Department and triage nurse.

## 2017-10-15 ENCOUNTER — Inpatient Hospital Stay: Payer: BC Managed Care – PPO

## 2017-10-15 VITALS — BP 155/47 | HR 54 | Temp 98.2°F | Resp 18

## 2017-10-15 DIAGNOSIS — D469 Myelodysplastic syndrome, unspecified: Secondary | ICD-10-CM

## 2017-10-15 DIAGNOSIS — Z5111 Encounter for antineoplastic chemotherapy: Secondary | ICD-10-CM | POA: Diagnosis not present

## 2017-10-15 MED ORDER — AZACITIDINE CHEMO SQ INJECTION
72.0000 mg/m2 | Freq: Once | INTRAMUSCULAR | Status: AC
  Administered 2017-10-15: 150 mg via SUBCUTANEOUS
  Filled 2017-10-15: qty 6

## 2017-10-15 MED ORDER — ONDANSETRON HCL 8 MG PO TABS
8.0000 mg | ORAL_TABLET | Freq: Once | ORAL | Status: AC
  Administered 2017-10-15: 8 mg via ORAL

## 2017-10-15 MED ORDER — ONDANSETRON HCL 8 MG PO TABS
ORAL_TABLET | ORAL | Status: AC
  Filled 2017-10-15: qty 1

## 2017-10-15 NOTE — Patient Instructions (Signed)
[facility] Discharge Instructions for Patients Receiving Chemotherapy  Today you received the following chemotherapy agents Vidaza  To help prevent nausea and vomiting after your treatment, we encourage you to take your nausea medication as directed  If you develop nausea and vomiting that is not controlled by your nausea medication, call the clinic.   BELOW ARE SYMPTOMS THAT SHOULD BE REPORTED IMMEDIATELY:  *FEVER GREATER THAN 100.5 F  *CHILLS WITH OR WITHOUT FEVER  NAUSEA AND VOMITING THAT IS NOT CONTROLLED WITH YOUR NAUSEA MEDICATION  *UNUSUAL SHORTNESS OF BREATH  *UNUSUAL BRUISING OR BLEEDING  TENDERNESS IN MOUTH AND THROAT WITH OR WITHOUT PRESENCE OF ULCERS  *URINARY PROBLEMS  *BOWEL PROBLEMS  UNUSUAL RASH Items with * indicate a potential emergency and should be followed up as soon as possible.  Feel free to call the clinic should you have any questions or concerns. The clinic phone number is [phone].  Please show the CHEMO ALERT CARD at check-in to the Emergency Department and triage nurse.   

## 2017-10-16 ENCOUNTER — Inpatient Hospital Stay: Payer: BC Managed Care – PPO

## 2017-10-16 VITALS — BP 159/43 | HR 67 | Temp 97.8°F | Resp 17 | Wt 216.8 lb

## 2017-10-16 DIAGNOSIS — D469 Myelodysplastic syndrome, unspecified: Secondary | ICD-10-CM

## 2017-10-16 DIAGNOSIS — Z5111 Encounter for antineoplastic chemotherapy: Secondary | ICD-10-CM | POA: Diagnosis not present

## 2017-10-16 MED ORDER — ONDANSETRON HCL 8 MG PO TABS
ORAL_TABLET | ORAL | Status: AC
  Filled 2017-10-16: qty 1

## 2017-10-16 MED ORDER — ONDANSETRON HCL 8 MG PO TABS
8.0000 mg | ORAL_TABLET | Freq: Once | ORAL | Status: AC
  Administered 2017-10-16: 8 mg via ORAL

## 2017-10-16 MED ORDER — AZACITIDINE CHEMO SQ INJECTION
71.5000 mg/m2 | Freq: Once | INTRAMUSCULAR | Status: AC
  Administered 2017-10-16: 150 mg via SUBCUTANEOUS
  Filled 2017-10-16: qty 6

## 2017-10-16 NOTE — Patient Instructions (Signed)
[facility] Discharge Instructions for Patients Receiving Chemotherapy  Today you received the following chemotherapy agents Vidaza  To help prevent nausea and vomiting after your treatment, we encourage you to take your nausea medication as directed  If you develop nausea and vomiting that is not controlled by your nausea medication, call the clinic.   BELOW ARE SYMPTOMS THAT SHOULD BE REPORTED IMMEDIATELY:  *FEVER GREATER THAN 100.5 F  *CHILLS WITH OR WITHOUT FEVER  NAUSEA AND VOMITING THAT IS NOT CONTROLLED WITH YOUR NAUSEA MEDICATION  *UNUSUAL SHORTNESS OF BREATH  *UNUSUAL BRUISING OR BLEEDING  TENDERNESS IN MOUTH AND THROAT WITH OR WITHOUT PRESENCE OF ULCERS  *URINARY PROBLEMS  *BOWEL PROBLEMS  UNUSUAL RASH Items with * indicate a potential emergency and should be followed up as soon as possible.  Feel free to call the clinic should you have any questions or concerns. The clinic phone number is [phone].  Please show the CHEMO ALERT CARD at check-in to the Emergency Department and triage nurse.   

## 2017-10-17 ENCOUNTER — Inpatient Hospital Stay: Payer: BC Managed Care – PPO

## 2017-10-17 VITALS — BP 136/41 | HR 67 | Temp 98.2°F | Resp 16

## 2017-10-17 DIAGNOSIS — D469 Myelodysplastic syndrome, unspecified: Secondary | ICD-10-CM

## 2017-10-17 DIAGNOSIS — Z5111 Encounter for antineoplastic chemotherapy: Secondary | ICD-10-CM | POA: Diagnosis not present

## 2017-10-17 MED ORDER — ONDANSETRON HCL 8 MG PO TABS
8.0000 mg | ORAL_TABLET | Freq: Once | ORAL | Status: AC
  Administered 2017-10-17: 8 mg via ORAL

## 2017-10-17 MED ORDER — ONDANSETRON HCL 8 MG PO TABS
ORAL_TABLET | ORAL | Status: AC
  Filled 2017-10-17: qty 1

## 2017-10-17 MED ORDER — AZACITIDINE CHEMO SQ INJECTION
72.0000 mg/m2 | Freq: Once | INTRAMUSCULAR | Status: AC
  Administered 2017-10-17: 150 mg via SUBCUTANEOUS
  Filled 2017-10-17: qty 6

## 2017-10-17 NOTE — Patient Instructions (Signed)
[facility] Discharge Instructions for Patients Receiving Chemotherapy  Today you received the following chemotherapy agents Vidaza  To help prevent nausea and vomiting after your treatment, we encourage you to take your nausea medication as directed  If you develop nausea and vomiting that is not controlled by your nausea medication, call the clinic.   BELOW ARE SYMPTOMS THAT SHOULD BE REPORTED IMMEDIATELY:  *FEVER GREATER THAN 100.5 F  *CHILLS WITH OR WITHOUT FEVER  NAUSEA AND VOMITING THAT IS NOT CONTROLLED WITH YOUR NAUSEA MEDICATION  *UNUSUAL SHORTNESS OF BREATH  *UNUSUAL BRUISING OR BLEEDING  TENDERNESS IN MOUTH AND THROAT WITH OR WITHOUT PRESENCE OF ULCERS  *URINARY PROBLEMS  *BOWEL PROBLEMS  UNUSUAL RASH Items with * indicate a potential emergency and should be followed up as soon as possible.  Feel free to call the clinic should you have any questions or concerns. The clinic phone number is [phone].  Please show the CHEMO ALERT CARD at check-in to the Emergency Department and triage nurse.   

## 2017-10-20 ENCOUNTER — Other Ambulatory Visit: Payer: Self-pay

## 2017-10-20 ENCOUNTER — Emergency Department (HOSPITAL_COMMUNITY)
Admission: EM | Admit: 2017-10-20 | Discharge: 2017-10-20 | Disposition: A | Discharge status: Home / Self Care | Payer: BC Managed Care – PPO | Attending: Emergency Medicine | Admitting: Emergency Medicine

## 2017-10-20 ENCOUNTER — Encounter (HOSPITAL_COMMUNITY): Payer: Self-pay | Admitting: *Deleted

## 2017-10-20 DIAGNOSIS — Z794 Long term (current) use of insulin: Secondary | ICD-10-CM | POA: Insufficient documentation

## 2017-10-20 DIAGNOSIS — Z87891 Personal history of nicotine dependence: Secondary | ICD-10-CM | POA: Insufficient documentation

## 2017-10-20 DIAGNOSIS — I10 Essential (primary) hypertension: Secondary | ICD-10-CM | POA: Insufficient documentation

## 2017-10-20 DIAGNOSIS — I2510 Atherosclerotic heart disease of native coronary artery without angina pectoris: Secondary | ICD-10-CM | POA: Diagnosis not present

## 2017-10-20 DIAGNOSIS — E119 Type 2 diabetes mellitus without complications: Secondary | ICD-10-CM | POA: Insufficient documentation

## 2017-10-20 DIAGNOSIS — B029 Zoster without complications: Secondary | ICD-10-CM | POA: Insufficient documentation

## 2017-10-20 DIAGNOSIS — Z79899 Other long term (current) drug therapy: Secondary | ICD-10-CM | POA: Diagnosis not present

## 2017-10-20 DIAGNOSIS — R21 Rash and other nonspecific skin eruption: Secondary | ICD-10-CM | POA: Diagnosis present

## 2017-10-20 LAB — COMPREHENSIVE METABOLIC PANEL
ALT: 29 U/L (ref 14–54)
AST: 36 U/L (ref 15–41)
Albumin: 4 g/dL (ref 3.5–5.0)
Alkaline Phosphatase: 122 U/L (ref 38–126)
Anion gap: 12 (ref 5–15)
BUN: 22 mg/dL — ABNORMAL HIGH (ref 6–20)
CO2: 25 mmol/L (ref 22–32)
Calcium: 9.3 mg/dL (ref 8.9–10.3)
Chloride: 95 mmol/L — ABNORMAL LOW (ref 101–111)
Creatinine, Ser: 0.99 mg/dL (ref 0.44–1.00)
GFR calc Af Amer: >60 mL/min (ref >60)
GFR calc non Af Amer: 60 mL/min — ABNORMAL LOW (ref >60)
Glucose, Bld: 242 mg/dL — ABNORMAL HIGH (ref 65–99)
Potassium: 4.2 mmol/L (ref 3.5–5.1)
Sodium: 132 mmol/L — ABNORMAL LOW (ref 135–145)
Total Bilirubin: 1.8 mg/dL — ABNORMAL HIGH (ref 0.3–1.2)
Total Protein: 7.4 g/dL (ref 6.5–8.1)

## 2017-10-20 LAB — CBC WITH DIFFERENTIAL/PLATELET
Basophils Absolute: 0 10*3/uL (ref 0.0–0.1)
Basophils Relative: 2 %
Eosinophils Absolute: 0.1 10*3/uL (ref 0.0–0.7)
Eosinophils Relative: 6 %
HCT: 23.9 % — ABNORMAL LOW (ref 36.0–46.0)
Hemoglobin: 8.1 g/dL — ABNORMAL LOW (ref 12.0–15.0)
Lymphocytes Relative: 18 %
Lymphs Abs: 0.4 10*3/uL — ABNORMAL LOW (ref 0.7–4.0)
MCH: 34.8 pg — ABNORMAL HIGH (ref 26.0–34.0)
MCHC: 33.9 g/dL (ref 30.0–36.0)
MCV: 102.6 fL — ABNORMAL HIGH (ref 78.0–100.0)
Monocytes Absolute: 0.2 10*3/uL (ref 0.1–1.0)
Monocytes Relative: 9 %
Neutro Abs: 1.4 10*3/uL — ABNORMAL LOW (ref 1.7–7.7)
Neutrophils Relative %: 65 %
Platelets: 346 10*3/uL (ref 150–400)
RBC: 2.33 MIL/uL — ABNORMAL LOW (ref 3.87–5.11)
WBC: 2.1 10*3/uL — ABNORMAL LOW (ref 4.0–10.5)

## 2017-10-20 MED ORDER — OXYCODONE-ACETAMINOPHEN 5-325 MG PO TABS
1.0000 | ORAL_TABLET | ORAL | Status: DC | PRN
  Administered 2017-10-20: 1 via ORAL
  Filled 2017-10-20: qty 1

## 2017-10-20 MED ORDER — ONDANSETRON HCL 4 MG/2ML IJ SOLN
4.0000 mg | Freq: Once | INTRAMUSCULAR | Status: AC
  Administered 2017-10-20: 4 mg via INTRAVENOUS
  Filled 2017-10-20: qty 2

## 2017-10-20 MED ORDER — HYDROCODONE-ACETAMINOPHEN 5-325 MG PO TABS: 1.0000 | ORAL_TABLET | Freq: Four times a day (QID) | ORAL | 0 refills | Status: DC | PRN

## 2017-10-20 MED ORDER — HYDROMORPHONE HCL 1 MG/ML IJ SOLN
1.0000 mg | Freq: Once | INTRAMUSCULAR | Status: AC
  Administered 2017-10-20: 1 mg via INTRAVENOUS
  Filled 2017-10-20: qty 1

## 2017-10-20 MED ORDER — VALACYCLOVIR HCL 500 MG PO TABS
1000.0000 mg | ORAL_TABLET | Freq: Once | ORAL | Status: AC
  Administered 2017-10-20: 1000 mg via ORAL
  Filled 2017-10-20 (×2): qty 2

## 2017-10-20 MED ORDER — VALACYCLOVIR HCL 1 G PO TABS: 1000.0000 mg | ORAL_TABLET | Freq: Three times a day (TID) | ORAL | 0 refills | Status: AC

## 2017-10-20 MED ORDER — SODIUM CHLORIDE 0.9 % IV BOLUS
1000.0000 mL | Freq: Once | INTRAVENOUS | Status: AC
  Administered 2017-10-20: 1000 mL via INTRAVENOUS

## 2017-10-20 MED ORDER — HYDRALAZINE HCL 25 MG PO TABS
25.0000 mg | ORAL_TABLET | Freq: Once | ORAL | Status: AC
  Administered 2017-10-20: 25 mg via ORAL
  Filled 2017-10-20: qty 1

## 2017-10-20 NOTE — ED Provider Notes (Signed)
[facility] EMERGENCY DEPARTMENT Provider Note   CSN: [id number] Arrival date & time: 10/20/17  1006     History   Chief Complaint Chief Complaint  Patient presents with  . Rash  . Pain    HPI Patricia Mata is a 62 y.o. female.  The history is provided by the patient.  Rash   This is a new problem. The current episode started yesterday. The problem has not changed since onset.Associated with: On chemotherapy for MDS. Last therapy last week. Pain started three days ago along left side of chest and rash appeared yesterday. There has been no fever. The fever has been present for less than 1 day. The rash is present on the torso. The pain is at a severity of 5/10. The pain is moderate. She has tried nothing for the symptoms. The treatment provided no relief.    Past Medical History:  Diagnosis Date  . Diabetes mellitus without complication ([place])   . Hypertension   . Macrocytic anemia     Patient Active Problem List   Diagnosis Date Noted  . Symptomatic anemia 06/14/2017  . Severe aortic stenosis 06/10/2017  . Carotid artery disease ([place]) 06/10/2017  . Essential hypertension 05/13/2017  . Hyperlipidemia 05/13/2017  . Bilateral lower extremity edema 05/13/2017  . Port-A-Cath in place 04/28/2017  . MDS (myelodysplastic syndrome) ([place]) 03/20/2017  . Goals of care, counseling/discussion 03/20/2017  . Anemia in chronic kidney disease 05/10/2016  . Anemia in stage 1 chronic kidney disease 05/10/2016  . Anemia 02/09/2016  . 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 COLON SURGERY    . IR FLUORO GUIDE PORT INSERTION RIGHT  04/02/2017  . IR US GUIDE VASC ACCESS RIGHT  04/02/2017     OB History   None      Home Medications    Prior to Admission medications   Medication Sig Start Date End Date Taking? Authorizing Provider  atorvastatin (LIPITOR) 40 MG tablet  Take 40 mg by mouth daily.    [provider]  Azilsartan-Chlorthalidone (EDARBYCLOR) 40-12.5 MG TABS Take 1 tablet by mouth daily.     [provider]  colchicine 0.6 MG tablet Take 0.6 mg by mouth 2 (two) times daily as needed (gout flare).  11/02/15   [provider]  furosemide (LASIX) 20 MG tablet TAKE 1 TABLET BY MOUTH TWICE A DAY 08/20/17   [name], MD  hydrALAZINE (APRESOLINE) 25 MG tablet Take 2 tablets (50 mg total) by mouth 3 (three) times daily. 06/27/17   Lorretta Harp, MD  HYDROcodone-acetaminophen (NORCO/VICODIN) 5-325 MG tablet Take 1 tablet by mouth every 6 (six) hours as needed for up to 15 doses. 10/20/17   Jerid Catherman, DO  insulin glargine (LANTUS) 100 unit/mL SOPN Inject 0.14 mLs (14 Units total) into the skin daily after breakfast. Patient taking differently: Inject 10 Units into the skin daily after breakfast.  06/16/17   Bonnielee Haff, MD  Insulin Pen Needle 29G X 5MM MISC Use as directed 06/16/17   Bonnielee Haff, MD  latanoprost (XALATAN) 0.005 % ophthalmic solution Place 1 drop into both eyes at bedtime.     [provider]  lidocaine-prilocaine (EMLA) cream Apply a quarter size of cream to port 1-2 hours prior to access. Cover with saran wrap. 04/17/17   [name], MD  Melatonin 10 MG CAPS Take 10 mg by mouth at bedtime  as needed (sleep).    [provider]  metoprolol (LOPRESSOR) 50 MG tablet Take 50 mg by mouth 2 (two) times daily.  11/28/15   [provider]  prochlorperazine (COMPAZINE) 10 MG tablet Take 1 tablet (10 mg total) by mouth every 6 (six) hours as needed for nausea or vomiting. 04/28/17   [name], MD  valACYclovir (VALTREX) 1000 MG tablet Take 1 tablet (1,000 mg total) by mouth 3 (three) times daily for 14 days. 10/20/17 11/03/17  Lennice Sites, DO    Family History Family History  Problem Relation Age of Onset  . Hypertension Mother   . Heart attack Father   . Hypertension  Sister   . Hypertension Brother   . Hypertension Sister   . Hypertension Sister   . Prostate cancer Brother   . HIV/AIDS Brother     Social History Social History   Tobacco Use  . Smoking status: Former Smoker    Types: Cigarettes    Last attempt to quit: 01/31/2001    Years since quitting: 16.7  . Smokeless tobacco: Former Network engineer Use Topics  . Alcohol use: Yes    Alcohol/week: 1.2 oz    Types: 2 Shots of liquor per week  . Drug use: Not on file     Allergies   Ancef [cefazolin] and Sulfa antibiotics   Review of Systems Review of Systems  Constitutional: Negative for chills and fever.  HENT: Negative for ear pain and sore throat.   Eyes: Negative for pain and visual disturbance.  Respiratory: Negative for cough and shortness of breath.   Cardiovascular: Negative for chest pain and palpitations.  Gastrointestinal: Negative for abdominal pain and vomiting.  Genitourinary: Negative for dysuria and hematuria.  Musculoskeletal: Negative for arthralgias and back pain.  Skin: Positive for rash. Negative for color change.  Neurological: Negative for seizures and syncope.  All other systems reviewed and are negative.    Physical Exam Updated Vital Signs  ED Triage Vitals  Enc Vitals Group     BP 10/20/17 1113 (!) 168/57     Pulse Rate 10/20/17 1113 65     Resp 10/20/17 1113 18     Temp 10/20/17 1113 98 F (36.7 C)     Temp Source 10/20/17 1113 Oral     SpO2 10/20/17 1113 100 %     Weight --      Height --      Head Circumference --      Peak Flow --      Pain Score 10/20/17 1111 10     Pain Loc --      Pain Edu? --      Excl. in [place]? --     Physical Exam  Constitutional: She appears well-developed and well-nourished. No distress.  HENT:  Head: Normocephalic and atraumatic.  Eyes: Conjunctivae are normal.  Neck: Neck supple.  Cardiovascular: Normal rate and regular rhythm.  No murmur heard. Pulmonary/Chest: Effort normal and breath sounds  normal. No respiratory distress.  Abdominal: Soft. There is no tenderness.  Musculoskeletal: She exhibits no edema.  Neurological: She is alert.  Skin: Skin is warm and dry. Rash (vesicular rash along left side of chest and back in dermatomal pattern) noted.  Psychiatric: She has a normal mood and affect.  Nursing note and vitals reviewed.    ED Treatments / Results  Labs (all labs ordered are listed, but only abnormal results are displayed) Labs Reviewed  CBC WITH DIFFERENTIAL/PLATELET - Abnormal; Notable  for the following components:      Result Value   WBC 2.1 (*)    RBC 2.33 (*)    Hemoglobin 8.1 (*)    HCT 23.9 (*)    MCV 102.6 (*)    MCH 34.8 (*)    Neutro Abs 1.4 (*)    Lymphs Abs 0.4 (*)    All other components within normal limits  COMPREHENSIVE METABOLIC PANEL - Abnormal; Notable for the following components:   Sodium 132 (*)    Chloride 95 (*)    Glucose, Bld 242 (*)    BUN 22 (*)    Total Bilirubin 1.8 (*)    GFR calc non Af Amer 60 (*)    All other components within normal limits    EKG None  Radiology No results found.  Procedures Procedures (including critical care time)  Medications Ordered in ED Medications  oxyCODONE-acetaminophen (PERCOCET/ROXICET) 5-325 MG per tablet 1 tablet (1 tablet Oral Given 10/20/17 1120)  valACYclovir (VALTREX) tablet 1,000 mg (has no administration in time range)  hydrALAZINE (APRESOLINE) tablet 25 mg (has no administration in time range)  ondansetron (ZOFRAN) injection 4 mg (has no administration in time range)  sodium chloride 0.9 % bolus 1,000 mL (0 mLs Intravenous Stopped 10/20/17 1837)  HYDROmorphone (DILAUDID) injection 1 mg (1 mg Intravenous Given 10/20/17 1735)     Initial Impression / Assessment and Plan / ED Course  I have reviewed the triage vital signs and the nursing notes.  Pertinent labs & imaging results that were available during my care of the patient were reviewed by me and considered in my medical  decision making (see chart for details).     Patricia Mata is a 62 year old female with history of myelodysplastic syndrome currently on chemotherapy, hypertension who presents to the ED with rash.  Patient with hypertension upon arrival but otherwise unremarkable vitals.  No fever.  Patient with pain over the left side of her chest and back for the last 4 days with a rash that developed yesterday.  Rash has the appearance of shingles as it does follow dermatomal pattern along the left side of the chest wall and about the fourth or fifth rib and wraps around to her back.  Patient states last chemotherapy was last week.  Has had a lot of pain but otherwise no fevers and no other symptoms.  Patient overall well-appearing but in clear distress from the pain from the rash.  Will obtain CBC, CMP and give patient home blood pressure medication with hydralazine.  Will order LR bolus, Zofran, Dilaudid IV.  Patient already given Norco while in triage.  Patient with overall pancytopenia but no signs of true neutropenia.  No significant electrolyte abnormality or acute kidney injury. No concern for disseminated zoster. Talked with oncology on the phone and they recommend continued use of valacyclovir and Norco for pain.  Patient was given first dose of valacyclovir while in the ED and discharged from the ED in good condition.  Given return precautions.  Final Clinical Impressions(s) / ED Diagnoses   Final diagnoses:  Herpes zoster without complication    ED Discharge Orders        Ordered    valACYclovir (VALTREX) 1000 MG tablet  3 times daily     10/20/17 1947    HYDROcodone-acetaminophen (NORCO/VICODIN) 5-325 MG tablet  Every 6 hours PRN     10/20/17 1947       Lennice Sites, DO 10/20/17 1953    Little, Wenda Overland,  MD 10/21/17 1555

## 2017-10-20 NOTE — ED Notes (Signed)
Pts name called for a room no answer 

## 2017-10-20 NOTE — ED Triage Notes (Addendum)
Pt reports pain to left breast and around to her back. Has rash with blisters noted under her breast and large area on left side of her back. Currently getting chemo, last tx was last week. Mask on pt at triage.

## 2017-10-20 NOTE — ED Notes (Signed)
Pt to be placed in room until shingles can ruled out per MD Ralene Bathe.

## 2017-10-27 ENCOUNTER — Telehealth: Payer: Self-pay | Admitting: *Deleted

## 2017-10-27 ENCOUNTER — Other Ambulatory Visit: Payer: Self-pay | Admitting: Medical

## 2017-10-27 MED ORDER — OXYCODONE-ACETAMINOPHEN 5-325 MG PO TABS: 1.0000 | ORAL_TABLET | ORAL | 0 refills | Status: DC | PRN

## 2017-10-27 NOTE — Telephone Encounter (Signed)
Instructed patient to come in tomorrow for labs/SMC/possible blood transfusion. Sandi Mealy, PA, to evaluate shingles from a week ago in the ER. Pain medications changed and escribed to her pharmacy per Lucianne Lei. Patient verbalized understanding.

## 2017-10-28 ENCOUNTER — Inpatient Hospital Stay: Payer: BC Managed Care – PPO

## 2017-10-28 ENCOUNTER — Inpatient Hospital Stay (HOSPITAL_BASED_OUTPATIENT_CLINIC_OR_DEPARTMENT_OTHER): Payer: BC Managed Care – PPO | Admitting: Medical

## 2017-10-28 VITALS — BP 171/58 | HR 69 | Temp 97.8°F | Resp 18 | Ht 64.0 in | Wt 217.0 lb

## 2017-10-28 DIAGNOSIS — N189 Chronic kidney disease, unspecified: Secondary | ICD-10-CM

## 2017-10-28 DIAGNOSIS — Z5111 Encounter for antineoplastic chemotherapy: Secondary | ICD-10-CM | POA: Diagnosis not present

## 2017-10-28 DIAGNOSIS — E1122 Type 2 diabetes mellitus with diabetic chronic kidney disease: Secondary | ICD-10-CM

## 2017-10-28 DIAGNOSIS — D469 Myelodysplastic syndrome, unspecified: Secondary | ICD-10-CM

## 2017-10-28 DIAGNOSIS — D631 Anemia in chronic kidney disease: Secondary | ICD-10-CM | POA: Diagnosis not present

## 2017-10-28 DIAGNOSIS — I129 Hypertensive chronic kidney disease with stage 1 through stage 4 chronic kidney disease, or unspecified chronic kidney disease: Secondary | ICD-10-CM

## 2017-10-28 DIAGNOSIS — B029 Zoster without complications: Secondary | ICD-10-CM

## 2017-10-28 LAB — COMPREHENSIVE METABOLIC PANEL
ALT: 31 U/L (ref 0–55)
AST: 40 U/L — ABNORMAL HIGH (ref 5–34)
Albumin: 3.6 g/dL (ref 3.5–5.0)
Alkaline Phosphatase: 139 U/L (ref 40–150)
Anion gap: 9 (ref 3–11)
BUN: 17 mg/dL (ref 7–26)
CO2: 27 mmol/L (ref 22–29)
Calcium: 9.5 mg/dL (ref 8.4–10.4)
Chloride: 99 mmol/L (ref 98–109)
Creatinine, Ser: 1.06 mg/dL (ref 0.60–1.10)
GFR calc Af Amer: >60 mL/min (ref >60)
GFR calc non Af Amer: 55 mL/min — ABNORMAL LOW (ref >60)
Glucose, Bld: 240 mg/dL — ABNORMAL HIGH (ref 70–140)
Potassium: 4.3 mmol/L (ref 3.5–5.1)
Sodium: 135 mmol/L — ABNORMAL LOW (ref 136–145)
Total Bilirubin: 1.2 mg/dL (ref 0.2–1.2)
Total Protein: 7 g/dL (ref 6.4–8.3)

## 2017-10-28 LAB — CBC WITH DIFFERENTIAL/PLATELET
Basophils Absolute: 0 10*3/uL (ref 0.0–0.1)
Basophils Relative: 1 %
Eosinophils Absolute: 0 10*3/uL (ref 0.0–0.5)
Eosinophils Relative: 2 %
HCT: 21.1 % — ABNORMAL LOW (ref 34.8–46.6)
Hemoglobin: 7.4 g/dL — ABNORMAL LOW (ref 11.6–15.9)
Lymphocytes Relative: 21 %
Lymphs Abs: 0.4 10*3/uL — ABNORMAL LOW (ref 0.9–3.3)
MCH: 37.3 pg — ABNORMAL HIGH (ref 25.1–34.0)
MCHC: 34.9 g/dL (ref 31.5–36.0)
MCV: 106.8 fL — ABNORMAL HIGH (ref 79.5–101.0)
Monocytes Absolute: 0.3 10*3/uL (ref 0.1–0.9)
Monocytes Relative: 15 %
Neutro Abs: 1.2 10*3/uL — ABNORMAL LOW (ref 1.5–6.5)
Neutrophils Relative %: 61 %
Platelets: 186 10*3/uL (ref 145–400)
RBC: 1.97 MIL/uL — ABNORMAL LOW (ref 3.70–5.45)
RDW: 32 % — ABNORMAL HIGH (ref 11.2–14.5)
WBC: 2 10*3/uL — ABNORMAL LOW (ref 3.9–10.3)

## 2017-10-28 LAB — SAMPLE TO BLOOD BANK

## 2017-10-28 MED ORDER — GABAPENTIN 100 MG PO CAPS: 200.0000 mg | ORAL_CAPSULE | Freq: Three times a day (TID) | ORAL | 1 refills | Status: DC

## 2017-10-28 MED ORDER — PREDNISONE 5 MG PO TABS: ORAL_TABLET | ORAL | 0 refills | Status: DC

## 2017-10-28 NOTE — Progress Notes (Signed)
Recent ED visit for shingles, here to recheck.  Pt has scabbing and vesicles across upper back, sides, and under breasts bilat.  Reports gen body aches and pain from rash.  Continued drainage/bleeding from vesicles at this time.  Afebrile.

## 2017-10-28 NOTE — Patient Instructions (Signed)
Shingles Shingles is an infection that causes a painful skin rash and fluid-filled blisters. Shingles is caused by the same virus that causes chickenpox. Shingles only develops in people who:  Have had chickenpox.  Have gotten the chickenpox vaccine. (This is rare.)  The first symptoms of shingles may be itching, tingling, or pain in an area on your skin. A rash will follow in a few days or weeks. The rash is usually on one side of the body in a bandlike or beltlike pattern. Over time, the rash turns into fluid-filled blisters that break open, scab over, and dry up. Medicines may:  Help you manage pain.  Help you recover more quickly.  Help to prevent long-term problems.  Follow these instructions at home: Medicines  Take medicines only as told by your doctor.  Apply an anti-itch or numbing cream to the affected area as told by your doctor. Blister and Rash Care  Take a cool bath or put cool compresses on the area of the rash or blisters as told by your doctor. This may help with pain and itching.  Keep your rash covered with a loose bandage (dressing). Wear loose-fitting clothing.  Keep your rash and blisters clean with mild soap and cool water or as told by your doctor.  Check your rash every day for signs of infection. These include redness, swelling, and pain that lasts or gets worse.  Do not pick your blisters.  Do not scratch your rash. General instructions  Rest as told by your doctor.  Keep all follow-up visits as told by your doctor. This is important.  Until your blisters scab over, your infection can cause chickenpox in people who have never had it or been vaccinated against it. To prevent this from happening, avoid touching other people or being around other people, especially: ? Babies. ? Pregnant women. ? Children who have eczema. ? Elderly people who have transplants. ? People who have chronic illnesses, such as leukemia or AIDS. Contact a doctor  if:  Your pain does not get better with medicine.  Your pain does not get better after the rash heals.  Your rash looks infected. Signs of infection include: ? Redness. ? Swelling. ? Pain that lasts or gets worse. Get help right away if:  The rash is on your face or nose.  You have pain in your face, pain around your eye area, or loss of feeling on one side of your face.  You have ear pain or you have ringing in your ear.  You have loss of taste.  Your condition gets worse. This information is not intended to replace advice given to you by your health care provider. Make sure you discuss any questions you have with your health care provider. Document Released: 12/18/2007 Document Revised: 02/25/2016 Document Reviewed: 04/12/2014 Elsevier Interactive Patient Education  2018 [employer]vier Inc.  

## 2017-10-30 ENCOUNTER — Telehealth: Payer: Self-pay | Admitting: Oncology

## 2017-10-30 NOTE — Telephone Encounter (Signed)
Spoke with patient to inform Disability forms have been successfully faxed to Doristine Johns at [facility] at [phone]. Mailed a copy to patient as well.

## 2017-10-30 NOTE — Progress Notes (Signed)
[facility] Progress Note   Patricia Mata [id number] Dec 11, 1955 62 y.o.  Patricia Mata is managed by Dr. Alen Blew  Actively treated with chemotherapy: yes  Current Therapy: Vidaza  Last Treated: 10/17/2017  Assessment: Plan:    Herpes zoster without complication - Plan: gabapentin (NEURONTIN) 100 MG capsule, predniSONE (DELTASONE) 5 MG tablet  MDS (myelodysplastic syndrome) (HCC)  Anemia in chronic kidney disease, unspecified CKD stage   Herpes zoster: The patient continues on Valtrex 1000 mg p.o. 3 times daily.  She is approximately 6 additional days of therapy remaining.  She continues using Vicodin 5-325, 1 tablet every 6 hours as needed for pain.  She is given a prescription for gabapentin 100 mg, 2 capsules p.o. 3 times daily and has been given a prednisone taper.  MDS and anemia of chronic disease: CBC returns today with a hemoglobin of 7.4.  The patient reports that her energy level has been good.  She request to defer a transfusion today.  This was discussed with Dr. Alen Blew who is in agreement with the patient's decision.  She is scheduled to see Dr. Alen Blew in follow-up on 11/10/2017.  She agrees to call or return sooner should she have progressive fatigue, shortness of breath, or other signs of progressive anemia.  Please see After Visit Summary for patient specific instructions.  Future Appointments  Date Time Provider [place]  11/10/2017  8:30 AM [facility] LAB 2 [facility] None  11/10/2017  9:00 AM [name], MD [facility] None  11/10/2017  9:30 AM [facility] H28 [facility] None  11/11/2017 11:15 AM [facility] G24 [facility] None  11/12/2017 12:15 PM [facility] A1 [facility] None  11/13/2017  1:30 PM [facility] G24 [facility] None  11/14/2017 12:30 PM [facility] H28 [facility] None  11/21/2017 11:15 AM Lorretta Harp, MD [facility]    No orders of the defined types were placed in this encounter.      Subjective:    Patient ID:  Patricia Mata is a 62 y.o. (DOB 1956-07-14) female.  Chief Complaint:  Chief Complaint  Patient presents with  . Herpes Zoster    HPI Patricia Mata is a 62 year old female with a history of MDS and anemia secondary to chronic kidney disease.  She developed a rash on her left posterior thorax which extended laterally and anteriorly to her left inferior breast.  She developed this rash last week.  She was begun on Valtrex 1000 mg p.o. 3 times daily last Monday.  She was prescribed 14 days of therapy and continues on this medication with approximately 6 days remaining.  Despite this she continues to have vesicles which surround a central area of crusting and open wounds.  The area is exquisitely painful.  A CBC was completed today which returned showing a hemoglobin of 7.4 and hematocrit was 21.1.  The patient requests to defer a transfusion today and states that her energy level is good.  Medications: I have reviewed the patient's current medications.  Allergies:  Allergies  Allergen Reactions  . Ancef [Cefazolin] Itching    Severe itching- after procedure, ancef was the antibiotic.-04/02/17  . Sulfa Antibiotics Rash and Other (See Comments)    blisters    Past Medical History:  Diagnosis Date  . Diabetes mellitus without complication ([place])   . Hypertension   . Macrocytic anemia     Past Surgical History:  Procedure Laterality Date  . COLON SURGERY    . IR FLUORO GUIDE PORT INSERTION RIGHT  04/02/2017  . IR US GUIDE  VASC ACCESS RIGHT  04/02/2017    Family History  Problem Relation Age of Onset  . Hypertension Mother   . Heart attack Father   . Hypertension Sister   . Hypertension Brother   . Hypertension Sister   . Hypertension Sister   . Prostate cancer Brother   . HIV/AIDS Brother     Social History   Socioeconomic History  . Marital status: Married    Spouse name: Not on file  . Number of children: Not on file  . Years of education: Not on file  .  Highest education level: Not on file  Occupational History  . Not on file  Social Needs  . Financial resource strain: Not on file  . Food insecurity:    Worry: Not on file    Inability: Not on file  . Transportation needs:    Medical: Not on file    Non-medical: Not on file  Tobacco Use  . Smoking status: Former Smoker    Types: Cigarettes    Last attempt to quit: 01/31/2001    Years since quitting: 16.7  . Smokeless tobacco: Former Network engineer and Sexual Activity  . Alcohol use: Yes    Alcohol/week: 1.2 oz    Types: 2 Shots of liquor per week  . Drug use: Not on file  . Sexual activity: Not on file  Lifestyle  . Physical activity:    Days per week: Not on file    Minutes per session: Not on file  . Stress: Not on file  Relationships  . Social connections:    Talks on phone: Not on file    Gets together: Not on file    Attends religious service: Not on file    Active member of club or organization: Not on file    Attends meetings of clubs or organizations: Not on file    Relationship status: Not on file  . Intimate partner violence:    Fear of current or ex partner: Not on file    Emotionally abused: Not on file    Physically abused: Not on file    Forced sexual activity: Not on file  Other Topics Concern  . Not on file  Social History Narrative  . Not on file    Past Medical History, Surgical history, Social history, and Family history were reviewed and updated as appropriate.   Please see review of systems for further details on the patient's review from today.   Review of Systems:  Review of Systems  Constitutional: Negative for fatigue.  Respiratory: Negative for shortness of breath.   Cardiovascular: Negative for palpitations.  Skin: Positive for rash.    Objective:   Physical Exam:  BP (!) 171/58 (BP Location: Left Arm, Patient Position: Sitting) Comment: Notified Nurse of BP  Pulse 69   Temp 97.8 F (36.6 C) (Oral)   Resp 18   Ht 5\' 4"   (1.626 m)   Wt 217 lb (98.4 kg)   SpO2 100%   BMI 37.25 kg/m  ECOG: 0  Physical Exam  Constitutional:  The patient is an adult female who appears to be in a moderate amount of discomfort.  HENT:  Head: Normocephalic and atraumatic.  Neurological: She is alert. Coordination normal.  Skin: Rash noted. There is erythema.       Lab Review:     Component Value Date/Time   NA 135 (L) 10/28/2017 1111   NA 132 (L) 06/10/2017 1028   NA 134 (L) 05/26/2017  0949   K 4.3 10/28/2017 1111   K 4.0 05/26/2017 0949   CL 99 10/28/2017 1111   CL 96 (L) 03/31/2012 1513   CO2 27 10/28/2017 1111   CO2 24 05/26/2017 0949   GLUCOSE 240 (H) 10/28/2017 1111   GLUCOSE 263 (H) 05/26/2017 0949   GLUCOSE 291 (H) 03/31/2012 1513   BUN 17 10/28/2017 1111   BUN 23 06/10/2017 1028   BUN 29.2 (H) 05/26/2017 0949   CREATININE 1.06 10/28/2017 1111   CREATININE 1.4 (H) 05/26/2017 0949   CALCIUM 9.5 10/28/2017 1111   CALCIUM 8.9 05/26/2017 0949   PROT 7.0 10/28/2017 1111   PROT 7.3 05/26/2017 0949   ALBUMIN 3.6 10/28/2017 1111   ALBUMIN 3.2 (L) 05/26/2017 0949   AST 40 (H) 10/28/2017 1111   AST 29 05/26/2017 0949   ALT 31 10/28/2017 1111   ALT 23 05/26/2017 0949   ALKPHOS 139 10/28/2017 1111   ALKPHOS 157 (H) 05/26/2017 0949   BILITOT 1.2 10/28/2017 1111   BILITOT 2.19 (H) 05/26/2017 0949   GFRNONAA 55 (L) 10/28/2017 1111   GFRAA >60 10/28/2017 1111       Component Value Date/Time   WBC 2.0 (L) 10/28/2017 1111   RBC 1.97 (L) 10/28/2017 1111   HGB 7.4 (L) 10/28/2017 1111   HGB 6.3 (LL) 06/10/2017 1028   HGB 5.7 (LL) 05/26/2017 0949   HCT 21.1 (L) 10/28/2017 1111   HCT 19.0 (L) 06/10/2017 1028   HCT 17.0 (L) 05/26/2017 0949   PLT 186 10/28/2017 1111   PLT 251 06/10/2017 1028   MCV 106.8 (H) 10/28/2017 1111   MCV 101 (H) 06/10/2017 1028   MCV 101.8 (H) 05/26/2017 0949   MCH 37.3 (H) 10/28/2017 1111   MCHC 34.9 10/28/2017 1111   RDW 32.0 (H) 10/28/2017 1111   RDW 26.1 (H) 06/10/2017  1028   RDW 27.7 (H) 05/26/2017 0949   LYMPHSABS 0.4 (L) 10/28/2017 1111   LYMPHSABS 0.5 (L) 06/10/2017 1028   LYMPHSABS 0.4 (L) 05/26/2017 0949   MONOABS 0.3 10/28/2017 1111   MONOABS 0.3 05/26/2017 0949   EOSABS 0.0 10/28/2017 1111   EOSABS 0.0 06/10/2017 1028   BASOSABS 0.0 10/28/2017 1111   BASOSABS 0.0 06/10/2017 1028   BASOSABS 0.1 05/26/2017 0949   -------------------------------  Imaging from last 24 hours (if applicable):  Radiology interpretation: No results found.      This case was discussed Dr. Alen Blew. He expressed agreement with my management of this patient.

## 2017-11-10 ENCOUNTER — Inpatient Hospital Stay: Payer: BC Managed Care – PPO

## 2017-11-10 ENCOUNTER — Inpatient Hospital Stay (HOSPITAL_BASED_OUTPATIENT_CLINIC_OR_DEPARTMENT_OTHER): Payer: BC Managed Care – PPO | Admitting: Oncology

## 2017-11-10 ENCOUNTER — Other Ambulatory Visit: Payer: Self-pay | Admitting: *Deleted

## 2017-11-10 VITALS — BP 127/45 | HR 60 | Temp 98.2°F | Resp 17 | Ht 64.0 in | Wt 226.3 lb

## 2017-11-10 VITALS — BP 174/49 | HR 61 | Temp 98.0°F | Resp 16

## 2017-11-10 DIAGNOSIS — D649 Anemia, unspecified: Secondary | ICD-10-CM

## 2017-11-10 DIAGNOSIS — E119 Type 2 diabetes mellitus without complications: Secondary | ICD-10-CM

## 2017-11-10 DIAGNOSIS — B029 Zoster without complications: Secondary | ICD-10-CM | POA: Diagnosis not present

## 2017-11-10 DIAGNOSIS — R5383 Other fatigue: Secondary | ICD-10-CM | POA: Diagnosis not present

## 2017-11-10 DIAGNOSIS — I350 Nonrheumatic aortic (valve) stenosis: Secondary | ICD-10-CM

## 2017-11-10 DIAGNOSIS — D469 Myelodysplastic syndrome, unspecified: Secondary | ICD-10-CM

## 2017-11-10 DIAGNOSIS — I10 Essential (primary) hypertension: Secondary | ICD-10-CM

## 2017-11-10 DIAGNOSIS — Z5111 Encounter for antineoplastic chemotherapy: Secondary | ICD-10-CM | POA: Diagnosis not present

## 2017-11-10 DIAGNOSIS — Z85038 Personal history of other malignant neoplasm of large intestine: Secondary | ICD-10-CM

## 2017-11-10 DIAGNOSIS — D509 Iron deficiency anemia, unspecified: Secondary | ICD-10-CM

## 2017-11-10 LAB — CBC WITH DIFFERENTIAL/PLATELET
Basophils Absolute: 0 10*3/uL (ref 0.0–0.1)
Basophils Relative: 1 %
Eosinophils Absolute: 0 10*3/uL (ref 0.0–0.5)
Eosinophils Relative: 0 %
HCT: 17.7 % — ABNORMAL LOW (ref 34.8–46.6)
Hemoglobin: 6.1 g/dL — CL (ref 11.6–15.9)
Lymphocytes Relative: 17 %
Lymphs Abs: 0.7 10*3/uL — ABNORMAL LOW (ref 0.9–3.3)
MCH: 37.7 pg — ABNORMAL HIGH (ref 25.1–34.0)
MCHC: 34.5 g/dL (ref 31.5–36.0)
MCV: 109.3 fL — ABNORMAL HIGH (ref 79.5–101.0)
Monocytes Absolute: 0.4 10*3/uL (ref 0.1–0.9)
Monocytes Relative: 11 %
Neutro Abs: 2.8 10*3/uL (ref 1.5–6.5)
Neutrophils Relative %: 71 %
Platelets: 153 10*3/uL (ref 145–400)
RBC: 1.62 MIL/uL — ABNORMAL LOW (ref 3.70–5.45)
WBC: 4 10*3/uL (ref 3.9–10.3)
nRBC: 2 /100 WBC — ABNORMAL HIGH

## 2017-11-10 LAB — COMPREHENSIVE METABOLIC PANEL
ALT: 18 U/L (ref 0–55)
AST: 18 U/L (ref 5–34)
Albumin: 3.7 g/dL (ref 3.5–5.0)
Alkaline Phosphatase: 101 U/L (ref 40–150)
Anion gap: 16 — ABNORMAL HIGH (ref 3–11)
BUN: 35 mg/dL — ABNORMAL HIGH (ref 7–26)
CO2: 17 mmol/L — ABNORMAL LOW (ref 22–29)
Calcium: 9.6 mg/dL (ref 8.4–10.4)
Chloride: 98 mmol/L (ref 98–109)
Creatinine, Ser: 1.6 mg/dL — ABNORMAL HIGH (ref 0.60–1.10)
GFR calc Af Amer: 39 mL/min — ABNORMAL LOW (ref >60)
GFR calc non Af Amer: 33 mL/min — ABNORMAL LOW (ref >60)
Glucose, Bld: 332 mg/dL — ABNORMAL HIGH (ref 70–140)
Potassium: 4.2 mmol/L (ref 3.5–5.1)
Sodium: 131 mmol/L — ABNORMAL LOW (ref 136–145)
Total Bilirubin: 0.8 mg/dL (ref 0.2–1.2)
Total Protein: 7 g/dL (ref 6.4–8.3)

## 2017-11-10 LAB — PREPARE RBC (CROSSMATCH)

## 2017-11-10 LAB — SAMPLE TO BLOOD BANK

## 2017-11-10 MED ORDER — ACETAMINOPHEN 325 MG PO TABS
ORAL_TABLET | ORAL | Status: AC
  Filled 2017-11-10: qty 2

## 2017-11-10 MED ORDER — SODIUM CHLORIDE 0.9 % IV SOLN
250.0000 mL | Freq: Once | INTRAVENOUS | Status: AC
  Administered 2017-11-10: 250 mL via INTRAVENOUS

## 2017-11-10 MED ORDER — ONDANSETRON HCL 8 MG PO TABS
8.0000 mg | ORAL_TABLET | Freq: Once | ORAL | Status: AC
  Administered 2017-11-10: 8 mg via ORAL

## 2017-11-10 MED ORDER — ONDANSETRON HCL 8 MG PO TABS
ORAL_TABLET | ORAL | Status: AC
  Filled 2017-11-10: qty 1

## 2017-11-10 MED ORDER — DIPHENHYDRAMINE HCL 25 MG PO CAPS
ORAL_CAPSULE | ORAL | Status: AC
  Filled 2017-11-10: qty 1

## 2017-11-10 MED ORDER — ACETAMINOPHEN 325 MG PO TABS
650.0000 mg | ORAL_TABLET | Freq: Once | ORAL | Status: AC
  Administered 2017-11-10: 650 mg via ORAL

## 2017-11-10 MED ORDER — DIPHENHYDRAMINE HCL 25 MG PO CAPS
25.0000 mg | ORAL_CAPSULE | Freq: Once | ORAL | Status: AC
  Administered 2017-11-10: 25 mg via ORAL

## 2017-11-10 MED ORDER — AZACITIDINE CHEMO SQ INJECTION
150.0000 mg | Freq: Once | INTRAMUSCULAR | Status: AC
  Administered 2017-11-10: 150 mg via SUBCUTANEOUS
  Filled 2017-11-10: qty 6

## 2017-11-10 NOTE — Progress Notes (Signed)
Ok to treat with Cr 1.6 per Dr. Alen Blew

## 2017-11-10 NOTE — Patient Instructions (Signed)
[facility] Discharge Instructions for Patients Receiving Chemotherapy  Today you received the following chemotherapy agents Vidaza.  To help prevent nausea and vomiting after your treatment, we encourage you to take your nausea medication as directed.   If you develop nausea and vomiting that is not controlled by your nausea medication, call the clinic.   BELOW ARE SYMPTOMS THAT SHOULD BE REPORTED IMMEDIATELY:  *FEVER GREATER THAN 100.5 F  *CHILLS WITH OR WITHOUT FEVER  NAUSEA AND VOMITING THAT IS NOT CONTROLLED WITH YOUR NAUSEA MEDICATION  *UNUSUAL SHORTNESS OF BREATH  *UNUSUAL BRUISING OR BLEEDING  TENDERNESS IN MOUTH AND THROAT WITH OR WITHOUT PRESENCE OF ULCERS  *URINARY PROBLEMS  *BOWEL PROBLEMS  UNUSUAL RASH Items with * indicate a potential emergency and should be followed up as soon as possible.  Feel free to call the clinic should you have any questions or concerns. The clinic phone number is [phone].  Please show the [place] at check-in to the Emergency Department and triage nurse.   Blood Transfusion, Adult A blood transfusion is a procedure in which you receive donated blood, including plasma, platelets, and red blood cells, through an IV tube. You may need a blood transfusion because of illness, surgery, or injury. The blood may come from a donor. You may also be able to donate blood for yourself (autologous blood donation) before a surgery if you know that you might require a blood transfusion. The blood given in a transfusion is made up of different types of cells. You may receive:  Red blood cells. These carry oxygen to the cells in the body.  White blood cells. These help you fight infections.  Platelets. These help your blood to clot.  Plasma. This is the liquid part of your blood and it helps with fluid imbalances.  If you have hemophilia or another clotting disorder, you may also receive other types of blood  products. Tell a health care provider about:  Any allergies you have.  All medicines you are taking, including vitamins, herbs, eye drops, creams, and over-the-counter medicines.  Any problems you or family members have had with anesthetic medicines.  Any blood disorders you have.  Any surgeries you have had.  Any medical conditions you have, including any recent fever or cold symptoms.  Whether you are pregnant or may be pregnant.  Any previous reactions you have had during a blood transfusion. What are the risks? Generally, this is a safe procedure. However, problems may occur, including:  Having an allergic reaction to something in the donated blood. Hives and itching may be symptoms of this type of reaction.  Fever. This may be a reaction to the white blood cells in the transfused blood. Nausea or chest pain may accompany a fever.  Iron overload. This can happen from having many transfusions.  Transfusion-related acute lung injury (TRALI). This is a rare reaction that causes lung damage. The cause is not known.TRALI can occur within hours of a transfusion or several days later.  Sudden (acute) or delayed hemolytic reactions. This happens if your blood does not match the cells in your transfusion. Your body's defense system (immune system) may try to attack the new cells. This complication is rare. The symptoms include fever, chills, nausea, and low back pain or chest pain.  Infection or disease transmission. This is rare.  What happens before the procedure?  You will have a blood test to determine your blood type. This is necessary to know what kind of  blood your body will accept and to match it to the donor blood.  If you are going to have a planned surgery, you may be able to do an autologous blood donation. This may be done in case you need to have a transfusion.  If you have had an allergic reaction to a transfusion in the past, you may be given medicine to help  prevent a reaction. This medicine may be given to you by mouth or through an IV tube.  You will have your temperature, blood pressure, and pulse monitored before the transfusion.  Follow instructions from your health care provider about eating and drinking restrictions.  Ask your health care provider about: ? Changing or stopping your regular medicines. This is especially important if you are taking diabetes medicines or blood thinners. ? Taking medicines such as aspirin and ibuprofen. These medicines can thin your blood. Do not take these medicines before your procedure if your health care provider instructs you not to. What happens during the procedure?  An IV tube will be inserted into one of your veins.  The bag of donated blood will be attached to your IV tube. The blood will then enter through your vein.  Your temperature, blood pressure, and pulse will be monitored regularly during the transfusion. This monitoring is done to detect early signs of a transfusion reaction.  If you have any signs or symptoms of a reaction, your transfusion will be stopped and you may be given medicine.  When the transfusion is complete, your IV tube will be removed.  Pressure may be applied to the IV site for a few minutes.  A bandage (dressing) will be applied. The procedure may vary among health care providers and hospitals. What happens after the procedure?  Your temperature, blood pressure, heart rate, breathing rate, and blood oxygen level will be monitored often.  Your blood may be tested to see how you are responding to the transfusion.  You may be warmed with fluids or blankets to maintain a normal body temperature. Summary  A blood transfusion is a procedure in which you receive donated blood, including plasma, platelets, and red blood cells, through an IV tube.  Your temperature, blood pressure, and pulse will be monitored before, during, and after the transfusion.  Your blood may  be tested after the transfusion to see how your body has responded. This information is not intended to replace advice given to you by your health care provider. Make sure you discuss any questions you have with your health care provider. Document Released: 06/28/2000 Document Revised: 03/28/2016 Document Reviewed: 03/28/2016 Elsevier Interactive Patient Education  2018 [employer].    Neutropenia Neutropenia is a condition that occurs when you have a lower-than-normal level of a type of white blood cell (neutrophil) in your body. Neutrophils are made in the spongy center of large bones (bone marrow) and they fight infections. Neutrophils are your body's main defense against bacterial and fungal infections. The fewer neutrophils you have and the longer your body remains without them, the greater your risk of getting a severe infection. What are the causes? This condition can occur if your body uses up or destroys neutrophils faster than your bone marrow can make them. This problem may happen because of:  Bacterial or fungal infection.  Allergic disorders.  Reactions to some medicines.  Autoimmune disease.  An enlarged spleen.  This condition can also occur if your bone marrow does not produce enough neutrophils. This problem may be caused by:  Cancer.  Cancer treatments, such as radiation or chemotherapy.  Viral infections.  Medicines, such as phenytoin.  Vitamin B12 deficiency.  Diseases of the bone marrow.  Environmental toxins, such as insecticides.  What are the signs or symptoms? This condition does not usually cause symptoms. If symptoms are present, they are usually caused by an underlying infection. Symptoms of an infection may include:  Fever.  Chills.  Swollen glands.  Oral or anal ulcers.  Cough and shortness of breath.  Rash.  Skin infection.  Fatigue.  How is this diagnosed? Your health care provider may suspect neutropenia if you have:  A  condition that may cause neutropenia.  Symptoms of infection, especially fever.  Frequent and unusual infections.  You will have a medical history and physical exam. Tests will also be done, such as:  A complete blood count (CBC).  A procedure to collect a sample of bone marrow for examination (bone marrow biopsy).  A chest X-ray.  A urine culture.  A blood culture.  How is this treated? Treatment depends on the underlying cause and severity of your condition. Mild neutropenia may not require treatment. Treatment may include medicines, such as:  Antibiotic medicine given through an IV tube.  Antiviral medicines.  Antifungal medicines.  A medicine to increase neutrophil production (colony-stimulating factor). You may get this drug through an IV tube or by injection.  Steroids given through an IV tube.  If an underlying condition is causing neutropenia, you may need treatment for that condition. If medicines you are taking are causing neutropenia, your health care provider may have you stop taking those medicines. Follow these instructions at home: Medicines  Take over-the-counter and prescription medicines only as told by your health care provider.  Get a seasonal flu shot (influenza vaccine). Lifestyle  Do not eat unpasteurized foods.Do not eat unwashed raw fruits or vegetables.  Avoid exposure to groups of people or children.  Avoid being around people who are sick.  Avoid being around dirt or dust, such as in construction areas or gardens.  Do not provide direct care for pets. Avoid animal droppings. Do not clean litter boxes and bird cages. Hygiene   Bathe daily.  Clean the area between the genitals and the anus (perineal area) after you urinate or have a bowel movement. If you are female, wipe from front to back.  Brush your teeth with a soft toothbrush before and after meals.  Do not use a razor that has a blade. Use an electric razor to remove  hair.  Wash your hands often. Make sure others who come in contact with you also wash their hands. If soap and water are not available, use hand sanitizer. General instructions  Do not have sex unless your health care provider has approved.  Take actions to avoid cuts and burns. For example: ? Be cautious when you use knives. Always cut away from yourself. ? Keep knives in protective sheaths or guards when not in use. ? Use oven mitts when you cook with a hot stove, oven, or grill. ? Stand a safe distance away from open fires.  Avoid people who received a vaccine in the past 30 days if that vaccine contained a live version of the germ (live vaccine). You should not get a live vaccine. Common live vaccines are varicella, measles, mumps, and rubella.  Do not share food utensils.  Do not use tampons, enemas, or rectal suppositories unless your health care provider has approved.  Keep all appointments as  told by your health care provider. This is important. Contact a health care provider if:  You have a fever.  You have chills or you start to shake.  You have: ? A sore throat. ? A warm, red, or tender area on your skin. ? A cough. ? Frequent or painful urination. ? Vaginal discharge or itching.  You develop: ? Sores in your mouth or anus. ? Swollen lymph nodes. ? Red streaks on the skin. ? A rash.  You feel: ? Nauseous or you vomit. ? Very fatigued. ? Short of breath. This information is not intended to replace advice given to you by your health care provider. Make sure you discuss any questions you have with your health care provider. Document Released: 12/21/2001 Document Revised: 12/07/2015 Document Reviewed: 01/11/2015 Elsevier Interactive Patient Education  [employer].

## 2017-11-10 NOTE — Progress Notes (Signed)
Hematology and Oncology Follow Up Visit  Patricia Mata [id number] [birth year] 62 y.o. 11/10/2017 9:35 AM   Principle Diagnosis: 62 year old woman with:   1.  Colon cancer diagnosed in April 2008 after presenting with T3 N1 disease.  She remains in remission without any evidence of recurrence.  2.  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    Prior Therapy: She is S/P hemicolectomy done in 11/2006. 1/13 lymph nodes are positive.  This was followed by 12 cycles of FOLFOX completed in 05/2007  Current therapy:  5-azacytidine at 75 mg/m square subcutaneously started in October 2018.  She completed 6 cycles of therapy.   Interim History:  Patricia Mata presents today for a follow-up.  Since the last visit, she developed herpes zoster infection affecting her left breast, chest wall and back.  She was treated with Valtrex 1000 mg 3 times a day as well as gabapentin and prednisone taper.  Her pain is much improved at this time although she does report residual tenderness and irritation around the affected area.  She is also reporting some increased fatigue and tiredness in the last 24 hours.  She is denying any shortness of breath or dyspnea on exertion.  She denies any active bleeding.  Her performance status remains stable.   She she denied headaches blurry vision, syncope or seizures.  She denies any postherpetic neuralgia.  She does not report any fevers, chills sweats.  Weight is increased.  She does not report any chest pain, palpitation orthopnea or leg edema. She does not report any cough, wheezing or hemoptysis. She does not report any nausea, vomiting or abdominal pain. She does not report any frequency urgency or hesitancy. She does not report any skeletal complaints without any arthralgias or myalgias.  She denies any anxiety or depression.  She denies any lymphadenopathy or easy bruisability.  Remaining review of systems is negative.   Medications: I have  reviewed the patient's current medications.   Allergies:  Allergies  Allergen Reactions  . Ancef [Cefazolin] Itching    Severe itching- after procedure, ancef was the antibiotic.-04/02/17  . Sulfa Antibiotics Rash and Other (See Comments)    blisters     Physical Exam:  Blood pressure (!) 127/45, pulse 60, temperature 98.2 F (36.8 C), temperature source Oral, resp. rate 17, height 5\' 4"  (1.626 m), weight 226 lb 4.8 oz (102.6 kg), SpO2 100 %.   ECOG: 0 General appearance: Well-appearing woman without distress.. Head: Normocephalic without any masses or lesions. Oropharynx: No thrush or ulcers. Eyes: Pupils are equal and round reactive to light. Lymph nodes: No cervical, axillary or supraclavicular adenopathy. Heart: Regular rate with systolic murmur noted.  No edema in her lower extremities. Lung: Clear in all lung fields without any rhonchi, wheezes or dullness to percussion. Abdomin: Soft, nontender without any rebound or guarding. Skin: No herpetic lesions noted.  Peeling skin noted on her back and left breast. Musculoskeletal: No clubbing or cyanosis. Neurological: No motor or sensory deficits.  Ambulating without difficulties.   Lab Results: Lab Results  Component Value Date   WBC 2.0 (L) 10/28/2017   HGB 7.4 (L) 10/28/2017   HCT 21.1 (L) 10/28/2017   MCV 106.8 (H) 10/28/2017   PLT 186 10/28/2017     Chemistry      Component Value Date/Time   NA 135 (L) 10/28/2017 1111   NA 132 (L) 06/10/2017 1028   NA 134 (L) 05/26/2017 0949   K 4.3 10/28/2017  1111   K 4.0 05/26/2017 0949   CL 99 10/28/2017 1111   CL 96 (L) 03/31/2012 1513   CO2 27 10/28/2017 1111   CO2 24 05/26/2017 0949   BUN 17 10/28/2017 1111   BUN 23 06/10/2017 1028   BUN 29.2 (H) 05/26/2017 0949   CREATININE 1.06 10/28/2017 1111   CREATININE 1.4 (H) 05/26/2017 0949      Component Value Date/Time   CALCIUM 9.5 10/28/2017 1111   CALCIUM 8.9 05/26/2017 0949   ALKPHOS 139 10/28/2017 1111   ALKPHOS  157 (H) 05/26/2017 0949   AST 40 (H) 10/28/2017 1111   AST 29 05/26/2017 0949   ALT 31 10/28/2017 1111   ALT 23 05/26/2017 0949   BILITOT 1.2 10/28/2017 1111   BILITOT 2.19 (H) 05/26/2017 0949       Impression and Plan:  62 year old female with the following issues:  1. MDS with multilineage dysplasia with an IPSS-R score is 2.5 diagnosed on 02/01/2016.   She completed 6 cycles of 5-azacytidine and ready to proceed with the next cycle of therapy.  Risks and benefits of continuing this therapy was discussed today and she is agreeable to proceed.  The plan is to proceed with this last cycle at this time.  Her transfusion need has persistent although have decreased overall.  Her hemoglobin today to 6.1 is down and she is symptomatic.  The plan is to proceed with 2 units of packed red cell transfusion in addition to 5 azacitidine.  2. Stage III colon cancer diagnosed in 2008 and remains in remission at this time.   3. Hypertension: Her blood pressure remains controlled at this time.   4.  Aortic stenosis: No evidence of congestive heart failure noted.  5.  Diabetes: No recent exacerbation on prednisone.  6.  Herpes zoster infection: Appears to be improving after antiviral therapy and prednisone taper.  Her pain is manageable.  7.  Neutropenia: White cell count is adequate and ready to proceed for the next cycle of therapy.  8. Follow-up: Will be in 3 weeks to follow her progress.  25  minutes was spent with the patient face-to-face today.  More than 50% of time was dedicated to patient counseling, education and coordinating her multifaceted care.   Zola Button, MD 4/29/20199:35 AM

## 2017-11-11 ENCOUNTER — Inpatient Hospital Stay: Payer: BC Managed Care – PPO

## 2017-11-11 VITALS — BP 188/59 | HR 64 | Temp 98.5°F | Resp 18

## 2017-11-11 DIAGNOSIS — D649 Anemia, unspecified: Secondary | ICD-10-CM

## 2017-11-11 DIAGNOSIS — Z5111 Encounter for antineoplastic chemotherapy: Secondary | ICD-10-CM | POA: Diagnosis not present

## 2017-11-11 DIAGNOSIS — D469 Myelodysplastic syndrome, unspecified: Secondary | ICD-10-CM

## 2017-11-11 LAB — PREPARE RBC (CROSSMATCH)

## 2017-11-11 MED ORDER — DIPHENHYDRAMINE HCL 25 MG PO CAPS
ORAL_CAPSULE | ORAL | Status: AC
  Filled 2017-11-11: qty 1

## 2017-11-11 MED ORDER — ACETAMINOPHEN 325 MG PO TABS
ORAL_TABLET | ORAL | Status: AC
  Filled 2017-11-11: qty 2

## 2017-11-11 MED ORDER — ONDANSETRON HCL 8 MG PO TABS
8.0000 mg | ORAL_TABLET | Freq: Once | ORAL | Status: AC
  Administered 2017-11-11: 8 mg via ORAL

## 2017-11-11 MED ORDER — AZACITIDINE CHEMO SQ INJECTION
72.0000 mg/m2 | Freq: Once | INTRAMUSCULAR | Status: AC
  Administered 2017-11-11: 150 mg via SUBCUTANEOUS
  Filled 2017-11-11: qty 6

## 2017-11-11 MED ORDER — SODIUM CHLORIDE 0.9 % IV SOLN
250.0000 mL | Freq: Once | INTRAVENOUS | Status: AC
  Administered 2017-11-11: 250 mL via INTRAVENOUS

## 2017-11-11 MED ORDER — DIPHENHYDRAMINE HCL 25 MG PO CAPS
25.0000 mg | ORAL_CAPSULE | Freq: Once | ORAL | Status: AC
  Administered 2017-11-11: 25 mg via ORAL

## 2017-11-11 MED ORDER — ACETAMINOPHEN 325 MG PO TABS
650.0000 mg | ORAL_TABLET | Freq: Once | ORAL | Status: AC
  Administered 2017-11-11: 650 mg via ORAL

## 2017-11-11 MED ORDER — ONDANSETRON HCL 8 MG PO TABS
ORAL_TABLET | ORAL | Status: AC
  Filled 2017-11-11: qty 1

## 2017-11-11 NOTE — Patient Instructions (Signed)
[facility] Discharge Instructions for Patients Receiving Chemotherapy  Today you received the following chemotherapy agents Vidaza.  To help prevent nausea and vomiting after your treatment, we encourage you to take your nausea medication as directed.   If you develop nausea and vomiting that is not controlled by your nausea medication, call the clinic.   BELOW ARE SYMPTOMS THAT SHOULD BE REPORTED IMMEDIATELY:  *FEVER GREATER THAN 100.5 F  *CHILLS WITH OR WITHOUT FEVER  NAUSEA AND VOMITING THAT IS NOT CONTROLLED WITH YOUR NAUSEA MEDICATION  *UNUSUAL SHORTNESS OF BREATH  *UNUSUAL BRUISING OR BLEEDING  TENDERNESS IN MOUTH AND THROAT WITH OR WITHOUT PRESENCE OF ULCERS  *URINARY PROBLEMS  *BOWEL PROBLEMS  UNUSUAL RASH Items with * indicate a potential emergency and should be followed up as soon as possible.  Feel free to call the clinic should you have any questions or concerns. The clinic phone number is [phone].  Please show the [place] at check-in to the Emergency Department and triage nurse.   Blood Transfusion, Adult A blood transfusion is a procedure in which you receive donated blood, including plasma, platelets, and red blood cells, through an IV tube. You may need a blood transfusion because of illness, surgery, or injury. The blood may come from a donor. You may also be able to donate blood for yourself (autologous blood donation) before a surgery if you know that you might require a blood transfusion. The blood given in a transfusion is made up of different types of cells. You may receive:  Red blood cells. These carry oxygen to the cells in the body.  White blood cells. These help you fight infections.  Platelets. These help your blood to clot.  Plasma. This is the liquid part of your blood and it helps with fluid imbalances.  If you have hemophilia or another clotting disorder, you may also receive other types of blood  products. Tell a health care provider about:  Any allergies you have.  All medicines you are taking, including vitamins, herbs, eye drops, creams, and over-the-counter medicines.  Any problems you or family members have had with anesthetic medicines.  Any blood disorders you have.  Any surgeries you have had.  Any medical conditions you have, including any recent fever or cold symptoms.  Whether you are pregnant or may be pregnant.  Any previous reactions you have had during a blood transfusion. What are the risks? Generally, this is a safe procedure. However, problems may occur, including:  Having an allergic reaction to something in the donated blood. Hives and itching may be symptoms of this type of reaction.  Fever. This may be a reaction to the white blood cells in the transfused blood. Nausea or chest pain may accompany a fever.  Iron overload. This can happen from having many transfusions.  Transfusion-related acute lung injury (TRALI). This is a rare reaction that causes lung damage. The cause is not known.TRALI can occur within hours of a transfusion or several days later.  Sudden (acute) or delayed hemolytic reactions. This happens if your blood does not match the cells in your transfusion. Your body's defense system (immune system) may try to attack the new cells. This complication is rare. The symptoms include fever, chills, nausea, and low back pain or chest pain.  Infection or disease transmission. This is rare.  What happens before the procedure?  You will have a blood test to determine your blood type. This is necessary to know what kind of  blood your body will accept and to match it to the donor blood.  If you are going to have a planned surgery, you may be able to do an autologous blood donation. This may be done in case you need to have a transfusion.  If you have had an allergic reaction to a transfusion in the past, you may be given medicine to help  prevent a reaction. This medicine may be given to you by mouth or through an IV tube.  You will have your temperature, blood pressure, and pulse monitored before the transfusion.  Follow instructions from your health care provider about eating and drinking restrictions.  Ask your health care provider about: ? Changing or stopping your regular medicines. This is especially important if you are taking diabetes medicines or blood thinners. ? Taking medicines such as aspirin and ibuprofen. These medicines can thin your blood. Do not take these medicines before your procedure if your health care provider instructs you not to. What happens during the procedure?  An IV tube will be inserted into one of your veins.  The bag of donated blood will be attached to your IV tube. The blood will then enter through your vein.  Your temperature, blood pressure, and pulse will be monitored regularly during the transfusion. This monitoring is done to detect early signs of a transfusion reaction.  If you have any signs or symptoms of a reaction, your transfusion will be stopped and you may be given medicine.  When the transfusion is complete, your IV tube will be removed.  Pressure may be applied to the IV site for a few minutes.  A bandage (dressing) will be applied. The procedure may vary among health care providers and hospitals. What happens after the procedure?  Your temperature, blood pressure, heart rate, breathing rate, and blood oxygen level will be monitored often.  Your blood may be tested to see how you are responding to the transfusion.  You may be warmed with fluids or blankets to maintain a normal body temperature. Summary  A blood transfusion is a procedure in which you receive donated blood, including plasma, platelets, and red blood cells, through an IV tube.  Your temperature, blood pressure, and pulse will be monitored before, during, and after the transfusion.  Your blood may  be tested after the transfusion to see how your body has responded. This information is not intended to replace advice given to you by your health care provider. Make sure you discuss any questions you have with your health care provider. Document Released: 06/28/2000 Document Revised: 03/28/2016 Document Reviewed: 03/28/2016 Elsevier Interactive Patient Education  2018 [employer].    Neutropenia Neutropenia is a condition that occurs when you have a lower-than-normal level of a type of white blood cell (neutrophil) in your body. Neutrophils are made in the spongy center of large bones (bone marrow) and they fight infections. Neutrophils are your body's main defense against bacterial and fungal infections. The fewer neutrophils you have and the longer your body remains without them, the greater your risk of getting a severe infection. What are the causes? This condition can occur if your body uses up or destroys neutrophils faster than your bone marrow can make them. This problem may happen because of:  Bacterial or fungal infection.  Allergic disorders.  Reactions to some medicines.  Autoimmune disease.  An enlarged spleen.  This condition can also occur if your bone marrow does not produce enough neutrophils. This problem may be caused by:  Cancer.  Cancer treatments, such as radiation or chemotherapy.  Viral infections.  Medicines, such as phenytoin.  Vitamin B12 deficiency.  Diseases of the bone marrow.  Environmental toxins, such as insecticides.  What are the signs or symptoms? This condition does not usually cause symptoms. If symptoms are present, they are usually caused by an underlying infection. Symptoms of an infection may include:  Fever.  Chills.  Swollen glands.  Oral or anal ulcers.  Cough and shortness of breath.  Rash.  Skin infection.  Fatigue.  How is this diagnosed? Your health care provider may suspect neutropenia if you have:  A  condition that may cause neutropenia.  Symptoms of infection, especially fever.  Frequent and unusual infections.  You will have a medical history and physical exam. Tests will also be done, such as:  A complete blood count (CBC).  A procedure to collect a sample of bone marrow for examination (bone marrow biopsy).  A chest X-ray.  A urine culture.  A blood culture.  How is this treated? Treatment depends on the underlying cause and severity of your condition. Mild neutropenia may not require treatment. Treatment may include medicines, such as:  Antibiotic medicine given through an IV tube.  Antiviral medicines.  Antifungal medicines.  A medicine to increase neutrophil production (colony-stimulating factor). You may get this drug through an IV tube or by injection.  Steroids given through an IV tube.  If an underlying condition is causing neutropenia, you may need treatment for that condition. If medicines you are taking are causing neutropenia, your health care provider may have you stop taking those medicines. Follow these instructions at home: Medicines  Take over-the-counter and prescription medicines only as told by your health care provider.  Get a seasonal flu shot (influenza vaccine). Lifestyle  Do not eat unpasteurized foods.Do not eat unwashed raw fruits or vegetables.  Avoid exposure to groups of people or children.  Avoid being around people who are sick.  Avoid being around dirt or dust, such as in construction areas or gardens.  Do not provide direct care for pets. Avoid animal droppings. Do not clean litter boxes and bird cages. Hygiene   Bathe daily.  Clean the area between the genitals and the anus (perineal area) after you urinate or have a bowel movement. If you are female, wipe from front to back.  Brush your teeth with a soft toothbrush before and after meals.  Do not use a razor that has a blade. Use an electric razor to remove  hair.  Wash your hands often. Make sure others who come in contact with you also wash their hands. If soap and water are not available, use hand sanitizer. General instructions  Do not have sex unless your health care provider has approved.  Take actions to avoid cuts and burns. For example: ? Be cautious when you use knives. Always cut away from yourself. ? Keep knives in protective sheaths or guards when not in use. ? Use oven mitts when you cook with a hot stove, oven, or grill. ? Stand a safe distance away from open fires.  Avoid people who received a vaccine in the past 30 days if that vaccine contained a live version of the germ (live vaccine). You should not get a live vaccine. Common live vaccines are varicella, measles, mumps, and rubella.  Do not share food utensils.  Do not use tampons, enemas, or rectal suppositories unless your health care provider has approved.  Keep all appointments as  told by your health care provider. This is important. Contact a health care provider if:  You have a fever.  You have chills or you start to shake.  You have: ? A sore throat. ? A warm, red, or tender area on your skin. ? A cough. ? Frequent or painful urination. ? Vaginal discharge or itching.  You develop: ? Sores in your mouth or anus. ? Swollen lymph nodes. ? Red streaks on the skin. ? A rash.  You feel: ? Nauseous or you vomit. ? Very fatigued. ? Short of breath. This information is not intended to replace advice given to you by your health care provider. Make sure you discuss any questions you have with your health care provider. Document Released: 12/21/2001 Document Revised: 12/07/2015 Document Reviewed: 01/11/2015 Elsevier Interactive Patient Education  [employer].

## 2017-11-12 ENCOUNTER — Inpatient Hospital Stay: Payer: BC Managed Care – PPO | Attending: Oncology

## 2017-11-12 VITALS — BP 171/53 | HR 75 | Temp 98.5°F | Resp 18

## 2017-11-12 DIAGNOSIS — Z5111 Encounter for antineoplastic chemotherapy: Secondary | ICD-10-CM | POA: Insufficient documentation

## 2017-11-12 DIAGNOSIS — D469 Myelodysplastic syndrome, unspecified: Secondary | ICD-10-CM | POA: Insufficient documentation

## 2017-11-12 LAB — BPAM RBC
Blood Product Expiration Date: 201905272359
Blood Product Expiration Date: 201905282359
ISSUE DATE / TIME: 201904291116
ISSUE DATE / TIME: 201904301307
Unit Type and Rh: 600
Unit Type and Rh: 600

## 2017-11-12 LAB — TYPE AND SCREEN
ABO/RH(D): A NEG
Antibody Screen: NEGATIVE
Unit division: 0
Unit division: 0

## 2017-11-12 MED ORDER — ONDANSETRON HCL 8 MG PO TABS
ORAL_TABLET | ORAL | Status: AC
  Filled 2017-11-12: qty 1

## 2017-11-12 MED ORDER — ONDANSETRON HCL 8 MG PO TABS
8.0000 mg | ORAL_TABLET | Freq: Once | ORAL | Status: AC
  Administered 2017-11-12: 8 mg via ORAL

## 2017-11-12 MED ORDER — AZACITIDINE CHEMO SQ INJECTION
72.0000 mg/m2 | Freq: Once | INTRAMUSCULAR | Status: AC
  Administered 2017-11-12: 150 mg via SUBCUTANEOUS
  Filled 2017-11-12: qty 6

## 2017-11-12 NOTE — Patient Instructions (Signed)
[facility] Discharge Instructions for Patients Receiving Chemotherapy  Today you received the following chemotherapy agents Vidaza.  To help prevent nausea and vomiting after your treatment, we encourage you to take your nausea medication as directed.   If you develop nausea and vomiting that is not controlled by your nausea medication, call the clinic.   BELOW ARE SYMPTOMS THAT SHOULD BE REPORTED IMMEDIATELY:  *FEVER GREATER THAN 100.5 F  *CHILLS WITH OR WITHOUT FEVER  NAUSEA AND VOMITING THAT IS NOT CONTROLLED WITH YOUR NAUSEA MEDICATION  *UNUSUAL SHORTNESS OF BREATH  *UNUSUAL BRUISING OR BLEEDING  TENDERNESS IN MOUTH AND THROAT WITH OR WITHOUT PRESENCE OF ULCERS  *URINARY PROBLEMS  *BOWEL PROBLEMS  UNUSUAL RASH Items with * indicate a potential emergency and should be followed up as soon as possible.  Feel free to call the clinic should you have any questions or concerns. The clinic phone number is [phone].  Please show the [place] at check-in to the Emergency Department and triage nurse.   Blood Transfusion, Adult A blood transfusion is a procedure in which you receive donated blood, including plasma, platelets, and red blood cells, through an IV tube. You may need a blood transfusion because of illness, surgery, or injury. The blood may come from a donor. You may also be able to donate blood for yourself (autologous blood donation) before a surgery if you know that you might require a blood transfusion. The blood given in a transfusion is made up of different types of cells. You may receive:  Red blood cells. These carry oxygen to the cells in the body.  White blood cells. These help you fight infections.  Platelets. These help your blood to clot.  Plasma. This is the liquid part of your blood and it helps with fluid imbalances.  If you have hemophilia or another clotting disorder, you may also receive other types of blood  products. Tell a health care provider about:  Any allergies you have.  All medicines you are taking, including vitamins, herbs, eye drops, creams, and over-the-counter medicines.  Any problems you or family members have had with anesthetic medicines.  Any blood disorders you have.  Any surgeries you have had.  Any medical conditions you have, including any recent fever or cold symptoms.  Whether you are pregnant or may be pregnant.  Any previous reactions you have had during a blood transfusion. What are the risks? Generally, this is a safe procedure. However, problems may occur, including:  Having an allergic reaction to something in the donated blood. Hives and itching may be symptoms of this type of reaction.  Fever. This may be a reaction to the white blood cells in the transfused blood. Nausea or chest pain may accompany a fever.  Iron overload. This can happen from having many transfusions.  Transfusion-related acute lung injury (TRALI). This is a rare reaction that causes lung damage. The cause is not known.TRALI can occur within hours of a transfusion or several days later.  Sudden (acute) or delayed hemolytic reactions. This happens if your blood does not match the cells in your transfusion. Your body's defense system (immune system) may try to attack the new cells. This complication is rare. The symptoms include fever, chills, nausea, and low back pain or chest pain.  Infection or disease transmission. This is rare.  What happens before the procedure?  You will have a blood test to determine your blood type. This is necessary to know what kind of  blood your body will accept and to match it to the donor blood.  If you are going to have a planned surgery, you may be able to do an autologous blood donation. This may be done in case you need to have a transfusion.  If you have had an allergic reaction to a transfusion in the past, you may be given medicine to help  prevent a reaction. This medicine may be given to you by mouth or through an IV tube.  You will have your temperature, blood pressure, and pulse monitored before the transfusion.  Follow instructions from your health care provider about eating and drinking restrictions.  Ask your health care provider about: ? Changing or stopping your regular medicines. This is especially important if you are taking diabetes medicines or blood thinners. ? Taking medicines such as aspirin and ibuprofen. These medicines can thin your blood. Do not take these medicines before your procedure if your health care provider instructs you not to. What happens during the procedure?  An IV tube will be inserted into one of your veins.  The bag of donated blood will be attached to your IV tube. The blood will then enter through your vein.  Your temperature, blood pressure, and pulse will be monitored regularly during the transfusion. This monitoring is done to detect early signs of a transfusion reaction.  If you have any signs or symptoms of a reaction, your transfusion will be stopped and you may be given medicine.  When the transfusion is complete, your IV tube will be removed.  Pressure may be applied to the IV site for a few minutes.  A bandage (dressing) will be applied. The procedure may vary among health care providers and hospitals. What happens after the procedure?  Your temperature, blood pressure, heart rate, breathing rate, and blood oxygen level will be monitored often.  Your blood may be tested to see how you are responding to the transfusion.  You may be warmed with fluids or blankets to maintain a normal body temperature. Summary  A blood transfusion is a procedure in which you receive donated blood, including plasma, platelets, and red blood cells, through an IV tube.  Your temperature, blood pressure, and pulse will be monitored before, during, and after the transfusion.  Your blood may  be tested after the transfusion to see how your body has responded. This information is not intended to replace advice given to you by your health care provider. Make sure you discuss any questions you have with your health care provider. Document Released: 06/28/2000 Document Revised: 03/28/2016 Document Reviewed: 03/28/2016 Elsevier Interactive Patient Education  2018 [employer].    Neutropenia Neutropenia is a condition that occurs when you have a lower-than-normal level of a type of white blood cell (neutrophil) in your body. Neutrophils are made in the spongy center of large bones (bone marrow) and they fight infections. Neutrophils are your body's main defense against bacterial and fungal infections. The fewer neutrophils you have and the longer your body remains without them, the greater your risk of getting a severe infection. What are the causes? This condition can occur if your body uses up or destroys neutrophils faster than your bone marrow can make them. This problem may happen because of:  Bacterial or fungal infection.  Allergic disorders.  Reactions to some medicines.  Autoimmune disease.  An enlarged spleen.  This condition can also occur if your bone marrow does not produce enough neutrophils. This problem may be caused by:  Cancer.  Cancer treatments, such as radiation or chemotherapy.  Viral infections.  Medicines, such as phenytoin.  Vitamin B12 deficiency.  Diseases of the bone marrow.  Environmental toxins, such as insecticides.  What are the signs or symptoms? This condition does not usually cause symptoms. If symptoms are present, they are usually caused by an underlying infection. Symptoms of an infection may include:  Fever.  Chills.  Swollen glands.  Oral or anal ulcers.  Cough and shortness of breath.  Rash.  Skin infection.  Fatigue.  How is this diagnosed? Your health care provider may suspect neutropenia if you have:  A  condition that may cause neutropenia.  Symptoms of infection, especially fever.  Frequent and unusual infections.  You will have a medical history and physical exam. Tests will also be done, such as:  A complete blood count (CBC).  A procedure to collect a sample of bone marrow for examination (bone marrow biopsy).  A chest X-ray.  A urine culture.  A blood culture.  How is this treated? Treatment depends on the underlying cause and severity of your condition. Mild neutropenia may not require treatment. Treatment may include medicines, such as:  Antibiotic medicine given through an IV tube.  Antiviral medicines.  Antifungal medicines.  A medicine to increase neutrophil production (colony-stimulating factor). You may get this drug through an IV tube or by injection.  Steroids given through an IV tube.  If an underlying condition is causing neutropenia, you may need treatment for that condition. If medicines you are taking are causing neutropenia, your health care provider may have you stop taking those medicines. Follow these instructions at home: Medicines  Take over-the-counter and prescription medicines only as told by your health care provider.  Get a seasonal flu shot (influenza vaccine). Lifestyle  Do not eat unpasteurized foods.Do not eat unwashed raw fruits or vegetables.  Avoid exposure to groups of people or children.  Avoid being around people who are sick.  Avoid being around dirt or dust, such as in construction areas or gardens.  Do not provide direct care for pets. Avoid animal droppings. Do not clean litter boxes and bird cages. Hygiene   Bathe daily.  Clean the area between the genitals and the anus (perineal area) after you urinate or have a bowel movement. If you are female, wipe from front to back.  Brush your teeth with a soft toothbrush before and after meals.  Do not use a razor that has a blade. Use an electric razor to remove  hair.  Wash your hands often. Make sure others who come in contact with you also wash their hands. If soap and water are not available, use hand sanitizer. General instructions  Do not have sex unless your health care provider has approved.  Take actions to avoid cuts and burns. For example: ? Be cautious when you use knives. Always cut away from yourself. ? Keep knives in protective sheaths or guards when not in use. ? Use oven mitts when you cook with a hot stove, oven, or grill. ? Stand a safe distance away from open fires.  Avoid people who received a vaccine in the past 30 days if that vaccine contained a live version of the germ (live vaccine). You should not get a live vaccine. Common live vaccines are varicella, measles, mumps, and rubella.  Do not share food utensils.  Do not use tampons, enemas, or rectal suppositories unless your health care provider has approved.  Keep all appointments as  told by your health care provider. This is important. Contact a health care provider if:  You have a fever.  You have chills or you start to shake.  You have: ? A sore throat. ? A warm, red, or tender area on your skin. ? A cough. ? Frequent or painful urination. ? Vaginal discharge or itching.  You develop: ? Sores in your mouth or anus. ? Swollen lymph nodes. ? Red streaks on the skin. ? A rash.  You feel: ? Nauseous or you vomit. ? Very fatigued. ? Short of breath. This information is not intended to replace advice given to you by your health care provider. Make sure you discuss any questions you have with your health care provider. Document Released: 12/21/2001 Document Revised: 12/07/2015 Document Reviewed: 01/11/2015 Elsevier Interactive Patient Education  [employer].

## 2017-11-13 ENCOUNTER — Inpatient Hospital Stay: Payer: BC Managed Care – PPO

## 2017-11-13 VITALS — BP 194/63 | HR 83 | Temp 98.9°F | Resp 18

## 2017-11-13 DIAGNOSIS — D469 Myelodysplastic syndrome, unspecified: Secondary | ICD-10-CM

## 2017-11-13 DIAGNOSIS — Z5111 Encounter for antineoplastic chemotherapy: Secondary | ICD-10-CM | POA: Diagnosis not present

## 2017-11-13 MED ORDER — ONDANSETRON HCL 8 MG PO TABS
8.0000 mg | ORAL_TABLET | Freq: Once | ORAL | Status: AC
  Administered 2017-11-13: 8 mg via ORAL

## 2017-11-13 MED ORDER — AZACITIDINE CHEMO SQ INJECTION
71.5000 mg/m2 | Freq: Once | INTRAMUSCULAR | Status: AC
  Administered 2017-11-13: 150 mg via SUBCUTANEOUS
  Filled 2017-11-13: qty 6

## 2017-11-13 MED ORDER — ONDANSETRON HCL 8 MG PO TABS
ORAL_TABLET | ORAL | Status: AC
  Filled 2017-11-13: qty 1

## 2017-11-13 NOTE — Patient Instructions (Signed)
[facility] Discharge Instructions for Patients Receiving Chemotherapy  Today you received the following chemotherapy agents Vidaza  To help prevent nausea and vomiting after your treatment, we encourage you to take your nausea medication as directed  If you develop nausea and vomiting that is not controlled by your nausea medication, call the clinic.   BELOW ARE SYMPTOMS THAT SHOULD BE REPORTED IMMEDIATELY:  *FEVER GREATER THAN 100.5 F  *CHILLS WITH OR WITHOUT FEVER  NAUSEA AND VOMITING THAT IS NOT CONTROLLED WITH YOUR NAUSEA MEDICATION  *UNUSUAL SHORTNESS OF BREATH  *UNUSUAL BRUISING OR BLEEDING  TENDERNESS IN MOUTH AND THROAT WITH OR WITHOUT PRESENCE OF ULCERS  *URINARY PROBLEMS  *BOWEL PROBLEMS  UNUSUAL RASH Items with * indicate a potential emergency and should be followed up as soon as possible.  Feel free to call the clinic should you have any questions or concerns. The clinic phone number is [phone].  Please show the CHEMO ALERT CARD at check-in to the Emergency Department and triage nurse.   

## 2017-11-14 ENCOUNTER — Inpatient Hospital Stay: Payer: BC Managed Care – PPO

## 2017-11-14 VITALS — BP 166/58 | HR 72 | Temp 99.3°F | Resp 17

## 2017-11-14 DIAGNOSIS — D469 Myelodysplastic syndrome, unspecified: Secondary | ICD-10-CM

## 2017-11-14 DIAGNOSIS — Z5111 Encounter for antineoplastic chemotherapy: Secondary | ICD-10-CM | POA: Diagnosis not present

## 2017-11-14 MED ORDER — ONDANSETRON HCL 8 MG PO TABS
8.0000 mg | ORAL_TABLET | Freq: Once | ORAL | Status: AC
  Administered 2017-11-14: 8 mg via ORAL

## 2017-11-14 MED ORDER — ONDANSETRON HCL 8 MG PO TABS
ORAL_TABLET | ORAL | Status: AC
  Filled 2017-11-14: qty 1

## 2017-11-14 MED ORDER — AZACITIDINE CHEMO SQ INJECTION
72.0000 mg/m2 | Freq: Once | INTRAMUSCULAR | Status: AC
  Administered 2017-11-14: 150 mg via SUBCUTANEOUS
  Filled 2017-11-14: qty 6

## 2017-11-14 NOTE — Patient Instructions (Signed)
[facility] Discharge Instructions for Patients Receiving Chemotherapy  Today you received the following chemotherapy agents:  Vidaza (azacitidine)  To help prevent nausea and vomiting after your treatment, we encourage you to take your nausea medication as prescribed.   If you develop nausea and vomiting that is not controlled by your nausea medication, call the clinic.   BELOW ARE SYMPTOMS THAT SHOULD BE REPORTED IMMEDIATELY:  *FEVER GREATER THAN 100.5 F  *CHILLS WITH OR WITHOUT FEVER  NAUSEA AND VOMITING THAT IS NOT CONTROLLED WITH YOUR NAUSEA MEDICATION  *UNUSUAL SHORTNESS OF BREATH  *UNUSUAL BRUISING OR BLEEDING  TENDERNESS IN MOUTH AND THROAT WITH OR WITHOUT PRESENCE OF ULCERS  *URINARY PROBLEMS  *BOWEL PROBLEMS  UNUSUAL RASH Items with * indicate a potential emergency and should be followed up as soon as possible.  Feel free to call the clinic should you have any questions or concerns. The clinic phone number is [phone].  Please show the CHEMO ALERT CARD at check-in to the Emergency Department and triage nurse.   

## 2017-11-21 ENCOUNTER — Ambulatory Visit: Payer: BC Managed Care – PPO | Admitting: Cardiovascular Disease

## 2017-11-21 ENCOUNTER — Encounter: Payer: Self-pay | Admitting: Cardiovascular Disease

## 2017-11-21 VITALS — BP 199/76 | HR 74 | Ht 64.0 in | Wt 224.8 lb

## 2017-11-21 DIAGNOSIS — E7800 Pure hypercholesterolemia, unspecified: Secondary | ICD-10-CM

## 2017-11-21 DIAGNOSIS — I10 Essential (primary) hypertension: Secondary | ICD-10-CM

## 2017-11-21 DIAGNOSIS — I6523 Occlusion and stenosis of bilateral carotid arteries: Secondary | ICD-10-CM | POA: Diagnosis not present

## 2017-11-21 DIAGNOSIS — I350 Nonrheumatic aortic (valve) stenosis: Secondary | ICD-10-CM | POA: Diagnosis not present

## 2017-11-21 NOTE — Progress Notes (Signed)
11/21/2017 Heide Brossart   04-19-1956  [id number]  Primary Physician Glendale Chard, MD Primary Cardiologist: Lorretta Harp MD [name], [place]  HPI:  Patricia Mata is a 62 y.o.  married African-American female mother of one child, grandmother of 2 children referred by Dr. Baird Cancer for cardiac evaluation because of lower extremity edema.She is accompanied by her husband [name] and daughter Deloris . I last saw her in the  06/27/2017.I apparently saw her 20 years ago which time I did a heart catheterization on her revealing normal coronary arteries and normal LV function. She was referred for evaluation of bilateral lower extremity edema. She does have a history of hypertension, hyperlipidemia and diabetes. She stopped smoking 20 years ago. She's never had a heart attack or stroke. Her father did have a myocardial infarction in his late 13s and died from that. She had colon cancer 10 years ago and had surgical cure with chemotherapy. She remains cancer free. She's recently had iron deficiency anemia and has undergone multiple transfusions. She's noticed increasing lower extremity edema over the last several weeks to months. She also drinks 2 mixed drinks a day.  Since I saw her she was begun on low-dose furosemide which resulted in improvement in her edema and dyspnea. Outpatient diagnostic studies included carotid Dopplers performed 06/03/17 that showed an occluded right ICA with moderate left ICA stenosis. 2-D echo performed 05/16/17 showed normal LV systolic function, moderate concentric LVH with grade 2 diastolic dysfunction and severe aortic stenosis with valve area 0.7 cm and a peak gradient of 70 mmHg.  I had arranged for her to undergo right and left heart cath to further evaluate her aortic stenosis however her precath labs were remarkable for severe hyperglycemia and anemia. She ultimately was transfused 3 units of packed red blood cells. 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 She was also hospitalized and placed on subcutaneous insulin. Since I saw her 2 weeks ago her dyspnea has markedly improved as result of transfusion and diuresis. Her hemoglobin has come up from the 6 range up to the mid 8 range. At this point, given her relative lack of symptoms and her other comorbidities going to defer invasive evaluation of her aortic stenosis we'll continue to monitor her noninvasively.  Since I saw her 5 months ago she is remained clinically stable.  With her hemoglobin in the stable range she is completely asymptomatic and denies chest pain or shortness of breath.  Recent carotid Dopplers performed last November revealed an occluded right internal carotid artery with moderate left ICA stenosis and 2D echo revealed normal LV systolic function with a aortic valve area of 0.7 cm with a peak gradient of 70 mmHg.     Current Meds  Medication Sig  . atorvastatin (LIPITOR) 40 MG tablet Take 40 mg by mouth daily.  . Azilsartan-Chlorthalidone (EDARBYCLOR) 40-12.5 MG TABS Take 1 tablet by mouth daily.   . colchicine 0.6 MG tablet Take 0.6 mg by mouth 2 (two) times daily as needed (gout flare).   . furosemide (LASIX) 20 MG tablet TAKE 1 TABLET BY MOUTH TWICE A DAY  . gabapentin (NEURONTIN) 100 MG capsule Take 2 capsules (200 mg total) by mouth 3 (three) times daily.  . hydrALAZINE (APRESOLINE) 25 MG tablet Take 2 tablets (50 mg total) by mouth 3 (three) times daily.  Marland Kitchen HYDROcodone-acetaminophen (NORCO/VICODIN) 5-325 MG tablet Take 1 tablet by mouth every 6 (six) hours as needed for up to 15 doses.  Marland Kitchen insulin  glargine (LANTUS) 100 unit/mL SOPN Inject 0.14 mLs (14 Units total) into the skin daily after breakfast. (Patient taking differently: Inject 10 Units into the skin daily after breakfast. )  . Insulin Pen Needle 29G X 5MM MISC Use as directed  . latanoprost (XALATAN) 0.005 % ophthalmic solution Place 1 drop into both eyes at bedtime.   . lidocaine-prilocaine  (EMLA) cream Apply a quarter size of cream to port 1-2 hours prior to access. Cover with saran wrap.  . Melatonin 10 MG CAPS Take 10 mg by mouth at bedtime as needed (sleep).  . metoprolol (LOPRESSOR) 50 MG tablet Take 50 mg by mouth 2 (two) times daily.   Marland Kitchen oxyCODONE-acetaminophen (PERCOCET/ROXICET) 5-325 MG tablet Take 1-2 tablets by mouth every 4 (four) hours as needed for severe pain.  . predniSONE (DELTASONE) 5 MG tablet 6 tab x 1 day, 5 tab x 1 day, 4 tab x 1 day, 3 tab x 1 day, 2 tab x 1 day, 1 tab x 1 day, stop  . prochlorperazine (COMPAZINE) 10 MG tablet Take 1 tablet (10 mg total) by mouth every 6 (six) hours as needed for nausea or vomiting.     Allergies  Allergen Reactions  . Ancef [Cefazolin] Itching    Severe itching- after procedure, ancef was the antibiotic.-04/02/17  . Sulfa Antibiotics Rash and Other (See Comments)    blisters    Social History   Socioeconomic History  . Marital status: Married    Spouse name: Not on file  . Number of children: Not on file  . Years of education: Not on file  . Highest education level: Not on file  Occupational History  . Not on file  Social Needs  . Financial resource strain: Not on file  . Food insecurity:    Worry: Not on file    Inability: Not on file  . Transportation needs:    Medical: Not on file    Non-medical: Not on file  Tobacco Use  . Smoking status: Former Smoker    Types: Cigarettes    Last attempt to quit: 01/31/2001    Years since quitting: 16.8  . Smokeless tobacco: Former Network engineer and Sexual Activity  . Alcohol use: Yes    Alcohol/week: 1.2 oz    Types: 2 Shots of liquor per week  . Drug use: Not on file  . Sexual activity: Not on file  Lifestyle  . Physical activity:    Days per week: Not on file    Minutes per session: Not on file  . Stress: Not on file  Relationships  . Social connections:    Talks on phone: Not on file    Gets together: Not on file    Attends religious service: Not on  file    Active member of club or organization: Not on file    Attends meetings of clubs or organizations: Not on file    Relationship status: Not on file  . Intimate partner violence:    Fear of current or ex partner: Not on file    Emotionally abused: Not on file    Physically abused: Not on file    Forced sexual activity: Not on file  Other Topics Concern  . Not on file  Social History Narrative  . Not on file     Review of Systems: General: negative for chills, fever, night sweats or weight changes.  Cardiovascular: negative for chest pain, dyspnea on exertion, edema, orthopnea, palpitations, paroxysmal nocturnal dyspnea or  shortness of breath Dermatological: negative for rash Respiratory: negative for cough or wheezing Urologic: negative for hematuria Abdominal: negative for nausea, vomiting, diarrhea, bright red blood per rectum, melena, or hematemesis Neurologic: negative for visual changes, syncope, or dizziness All other systems reviewed and are otherwise negative except as noted above.    Blood pressure (!) 199/76, pulse 74, height 5\' 4"  (1.626 m), weight 224 lb 12.8 oz (102 kg).  General appearance: alert and no distress Neck: no adenopathy, no JVD, supple, symmetrical, trachea midline, thyroid not enlarged, symmetric, no tenderness/mass/nodules and Bilateral carotid bruits Lungs: clear to auscultation bilaterally Heart: 3/6 systolic ejection murmur at the base consistent with aortic stenosis Extremities: extremities normal, atraumatic, no cyanosis or edema Pulses: 2+ and symmetric Skin: Skin color, texture, turgor normal. No rashes or lesions Neurologic: Alert and oriented X 3, normal strength and tone. Normal symmetric reflexes. Normal coordination and gait  EKG not performed today  ASSESSMENT AND PLAN:   Essential hypertension History of essential hypertension blood pressure measured at 199/76.  She apparently did not take her medicines this morning.  We will  recheck these  Hyperlipidemia History of hyperlipidemia on statin therapy  Severe aortic stenosis History of severe aortic stenosis 2D echo 05/16/2017 revealing normal LV systolic function with severe aortic stenosis and a valve area 0.71 cm with a peak gradient of 70 mmHg.  She is currently asymptomatic.  We will continue to follow this on an annual basis.  Carotid artery disease ([place]) History of carotid artery disease with duplex performed 06/03/2017 revealing an occluded right internal carotid artery with moderate left ICA stenosis which we are following annually by duplex ultrasound.      Lorretta Harp MD FACP,FACC,FAHA, [facility] 11/21/2017 11:55 AM

## 2017-11-21 NOTE — Assessment & Plan Note (Signed)
History of hyperlipidemia on statin therapy. 

## 2017-11-21 NOTE — Assessment & Plan Note (Signed)
History of essential hypertension blood pressure measured at 199/76.  She apparently did not take her medicines this morning.  We will recheck these

## 2017-11-21 NOTE — Assessment & Plan Note (Signed)
History of severe aortic stenosis 2D echo 05/16/2017 revealing normal LV systolic function with severe aortic stenosis and a valve area 0.71 cm with a peak gradient of 70 mmHg.  She is currently asymptomatic.  We will continue to follow this on an annual basis.

## 2017-11-21 NOTE — Assessment & Plan Note (Signed)
History of carotid artery disease with duplex performed 06/03/2017 revealing an occluded right internal carotid artery with moderate left ICA stenosis which we are following annually by duplex ultrasound.

## 2017-11-21 NOTE — Patient Instructions (Signed)
Medication Instructions: Your physician recommends that you continue on your current medications as directed. Please refer to the Current Medication list given to you today.   Testing/Procedures:  Schedule for November: Your physician has requested that you have an echocardiogram. Echocardiography is a painless test that uses sound waves to create images of your heart. It provides your doctor with information about the size and shape of your heart and how well your heart's chambers and valves are working. This procedure takes approximately one hour. There are no restrictions for this procedure.  Your physician has requested that you have a carotid duplex. This test is an ultrasound of the carotid arteries in your neck. It looks at blood flow through these arteries that supply the brain with blood. Allow one hour for this exam. There are no restrictions or special instructions.  Follow-Up: We request that you follow-up in: 6 months with an extender and in 12 months with Dr Andria Rhein will receive a reminder letter in the mail two months in advance. If you don't receive a letter, please call our office to schedule the follow-up appointment.  If you need a refill on your cardiac medications before your next appointment, please call your pharmacy.

## 2017-12-01 ENCOUNTER — Inpatient Hospital Stay (HOSPITAL_BASED_OUTPATIENT_CLINIC_OR_DEPARTMENT_OTHER): Payer: BC Managed Care – PPO | Admitting: Oncology

## 2017-12-01 ENCOUNTER — Inpatient Hospital Stay: Payer: BC Managed Care – PPO

## 2017-12-01 ENCOUNTER — Telehealth: Payer: Self-pay | Admitting: Oncology

## 2017-12-01 ENCOUNTER — Other Ambulatory Visit: Payer: Self-pay | Admitting: *Deleted

## 2017-12-01 VITALS — BP 140/45 | HR 72 | Temp 98.5°F | Resp 17 | Ht 64.0 in | Wt 230.0 lb

## 2017-12-01 DIAGNOSIS — D649 Anemia, unspecified: Secondary | ICD-10-CM

## 2017-12-01 DIAGNOSIS — D509 Iron deficiency anemia, unspecified: Secondary | ICD-10-CM

## 2017-12-01 DIAGNOSIS — Z85038 Personal history of other malignant neoplasm of large intestine: Secondary | ICD-10-CM | POA: Diagnosis not present

## 2017-12-01 DIAGNOSIS — B0229 Other postherpetic nervous system involvement: Secondary | ICD-10-CM

## 2017-12-01 DIAGNOSIS — D469 Myelodysplastic syndrome, unspecified: Secondary | ICD-10-CM

## 2017-12-01 DIAGNOSIS — D708 Other neutropenia: Secondary | ICD-10-CM

## 2017-12-01 DIAGNOSIS — I350 Nonrheumatic aortic (valve) stenosis: Secondary | ICD-10-CM | POA: Diagnosis not present

## 2017-12-01 DIAGNOSIS — I10 Essential (primary) hypertension: Secondary | ICD-10-CM

## 2017-12-01 DIAGNOSIS — D638 Anemia in other chronic diseases classified elsewhere: Secondary | ICD-10-CM | POA: Diagnosis not present

## 2017-12-01 DIAGNOSIS — Z9221 Personal history of antineoplastic chemotherapy: Secondary | ICD-10-CM

## 2017-12-01 DIAGNOSIS — Z5111 Encounter for antineoplastic chemotherapy: Secondary | ICD-10-CM | POA: Diagnosis not present

## 2017-12-01 LAB — CBC WITH DIFFERENTIAL/PLATELET
Band Neutrophils: 3 %
Basophils Absolute: 0 10*3/uL (ref 0.0–0.1)
Basophils Relative: 2 %
Blasts: 0 %
Eosinophils Absolute: 0 10*3/uL (ref 0.0–0.5)
Eosinophils Relative: 0 %
HCT: 17.4 % — ABNORMAL LOW (ref 34.8–46.6)
Hemoglobin: 6 g/dL — CL (ref 11.6–15.9)
Lymphocytes Relative: 42 %
Lymphs Abs: 0.6 10*3/uL — ABNORMAL LOW (ref 0.9–3.3)
MCH: 36.6 pg — ABNORMAL HIGH (ref 25.1–34.0)
MCHC: 34.5 g/dL (ref 31.5–36.0)
MCV: 106.1 fL — ABNORMAL HIGH (ref 79.5–101.0)
Metamyelocytes Relative: 0 %
Monocytes Absolute: 0.1 10*3/uL (ref 0.1–0.9)
Monocytes Relative: 8 %
Myelocytes: 0 %
Neutro Abs: 0.7 10*3/uL — ABNORMAL LOW (ref 1.5–6.5)
Neutrophils Relative %: 45 %
Other: 0 %
Platelets: 159 10*3/uL (ref 145–400)
Promyelocytes Relative: 0 %
RBC: 1.64 MIL/uL — ABNORMAL LOW (ref 3.70–5.45)
RDW: 28.4 % — ABNORMAL HIGH (ref 11.2–14.5)
WBC: 1.4 10*3/uL — ABNORMAL LOW (ref 3.9–10.3)
nRBC: 0 /100 WBC

## 2017-12-01 LAB — COMPREHENSIVE METABOLIC PANEL
ALT: 27 U/L (ref 0–55)
AST: 36 U/L — ABNORMAL HIGH (ref 5–34)
Albumin: 3.7 g/dL (ref 3.5–5.0)
Alkaline Phosphatase: 115 U/L (ref 40–150)
Anion gap: 11 (ref 3–11)
BUN: 22 mg/dL (ref 7–26)
CO2: 22 mmol/L (ref 22–29)
Calcium: 8.5 mg/dL (ref 8.4–10.4)
Chloride: 97 mmol/L — ABNORMAL LOW (ref 98–109)
Creatinine, Ser: 1.38 mg/dL — ABNORMAL HIGH (ref 0.60–1.10)
GFR calc Af Amer: 46 mL/min — ABNORMAL LOW (ref >60)
GFR calc non Af Amer: 40 mL/min — ABNORMAL LOW (ref >60)
Glucose, Bld: 173 mg/dL — ABNORMAL HIGH (ref 70–140)
Potassium: 3.9 mmol/L (ref 3.5–5.1)
Sodium: 130 mmol/L — ABNORMAL LOW (ref 136–145)
Total Bilirubin: 1.1 mg/dL (ref 0.2–1.2)
Total Protein: 6.5 g/dL (ref 6.4–8.3)

## 2017-12-01 LAB — SAMPLE TO BLOOD BANK

## 2017-12-01 LAB — PREPARE RBC (CROSSMATCH)

## 2017-12-01 MED ORDER — SODIUM CHLORIDE 0.9 % IV SOLN
250.0000 mL | Freq: Once | INTRAVENOUS | Status: AC
  Administered 2017-12-01: 250 mL via INTRAVENOUS

## 2017-12-01 MED ORDER — DIPHENHYDRAMINE HCL 25 MG PO CAPS
ORAL_CAPSULE | ORAL | Status: AC
  Filled 2017-12-01: qty 1

## 2017-12-01 MED ORDER — ACETAMINOPHEN 325 MG PO TABS
ORAL_TABLET | ORAL | Status: AC
  Filled 2017-12-01: qty 2

## 2017-12-01 MED ORDER — ACETAMINOPHEN 325 MG PO TABS
650.0000 mg | ORAL_TABLET | Freq: Once | ORAL | Status: AC
  Administered 2017-12-01: 650 mg via ORAL

## 2017-12-01 MED ORDER — DIPHENHYDRAMINE HCL 25 MG PO CAPS
25.0000 mg | ORAL_CAPSULE | Freq: Once | ORAL | Status: AC
  Administered 2017-12-01: 25 mg via ORAL

## 2017-12-01 NOTE — Progress Notes (Signed)
Hematology and Oncology Follow Up Visit  Patricia Mata [id number] Apr 25, 1956 62 y.o. 12/01/2017 9:06 AM   Principle Diagnosis: 63 year old woman with:   1.  T3N1 colon cancer diagnosed in April 2008.  She received surgical treatment followed by adjuvant chemotherapy and remains disease-free.    2.  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    Prior Therapy: She is S/P hemicolectomy done in 11/2006. 1/13 lymph nodes are positive.  This was followed by 12 cycles of FOLFOX completed in 05/2007  Current therapy:  5-azacytidine at 75 mg/m square subcutaneously started in October 2018.  She is status post 7 cycles of therapy completed in April 2019.   Interim History:  Mrs. Kuna is here for a follow-up.  Since her last visit, she has been doing reasonably well and continues to recover slowly.  She tolerated the last cycle of Vidaza with few complications.  She does report injection related pains but those have resolved.  She also reports postherpetic neuralgia on her left breast and back which is subsiding slowly.  She denies any shortness of breath or difficulty breathing.  Her performance status is reasonable.  She does report increased fatigue this morning compared to the last few days.  She denies any hematochezia or melena.  She she denied headaches blurry vision, syncope or seizures.  She denies any alteration of mental status.  She does not report any fevers, chills sweats.  Appetite and weight to remain intact.  She does not report any chest pain, palpitation orthopnea or leg edema. She does not report any cough, wheezing or hemoptysis. She does not report any nausea, vomiting or abdominal pain.  She denies any constipation or diarrhea.  She does not report any frequency urgency or hesitancy. She does not report any bone pain or pathological fractures.  She denies any lymphadenopathy or easy bruisability.  Remaining review of systems is negative.    Medications: I have reviewed the patient's current medications.   Allergies:  Allergies  Allergen Reactions  . Ancef [Cefazolin] Itching    Severe itching- after procedure, ancef was the antibiotic.-04/02/17  . Sulfa Antibiotics Rash and Other (See Comments)    blisters     Physical Exam:  Blood pressure (!) 140/45, pulse 72, temperature 98.5 F (36.9 C), resp. rate 17, height 5\' 4"  (1.626 m), weight 230 lb (104.3 kg).   ECOG: 0 General appearance: Alert, awake woman appeared without distress. Head: Atraumatic without abnormalities. Oropharynx: Mucous membranes are moist and pink. Eyes: Sclera anicteric. Lymph nodes: No lymphadenopathy noted in the cervical, axillary or supraclavicular regions. Heart: Regular rate and rhythm.  Ejection murmur noted. Lung: Clear without any rhonchi, wheezes or dullness to percussion.  No shifting dullness or ascites. Abdomin: Soft, nontender without any rebound or guarding. Skin: Scars noted on her left breast and back.  No erythema or induration.  No pustules. Musculoskeletal: Full range of motion noted in all joints.  No clubbing or cyanosis. Neurological: Intact motor, sensory exam.  Intact deep tendon reflexes.   Lab Results: Lab Results  Component Value Date   WBC 1.4 (L) 12/01/2017   HGB 6.0 (LL) 12/01/2017   HCT 17.4 (L) 12/01/2017   MCV 106.1 (H) 12/01/2017   PLT 159 12/01/2017     Chemistry      Component Value Date/Time   NA 131 (L) 11/10/2017 0921   NA 132 (L) 06/10/2017 1028   NA 134 (L) 05/26/2017 0949   K 4.2  11/10/2017 0921   K 4.0 05/26/2017 0949   CL 98 11/10/2017 0921   CL 96 (L) 03/31/2012 1513   CO2 17 (L) 11/10/2017 0921   CO2 24 05/26/2017 0949   BUN 35 (H) 11/10/2017 0921   BUN 23 06/10/2017 1028   BUN 29.2 (H) 05/26/2017 0949   CREATININE 1.60 (H) 11/10/2017 0921   CREATININE 1.4 (H) 05/26/2017 0949      Component Value Date/Time   CALCIUM 9.6 11/10/2017 0921   CALCIUM 8.9 05/26/2017 0949    ALKPHOS 101 11/10/2017 0921   ALKPHOS 157 (H) 05/26/2017 0949   AST 18 11/10/2017 0921   AST 29 05/26/2017 0949   ALT 18 11/10/2017 0921   ALT 23 05/26/2017 0949   BILITOT 0.8 11/10/2017 0921   BILITOT 2.19 (H) 05/26/2017 0949     Current Outpatient Medications on File Prior to Visit  Medication Sig Dispense Refill  . atorvastatin (LIPITOR) 40 MG tablet Take 40 mg by mouth daily.    . Azilsartan-Chlorthalidone (EDARBYCLOR) 40-12.5 MG TABS Take 1 tablet by mouth daily.     . colchicine 0.6 MG tablet Take 0.6 mg by mouth 2 (two) times daily as needed (gout flare).   2  . furosemide (LASIX) 20 MG tablet TAKE 1 TABLET BY MOUTH TWICE A DAY 60 tablet 0  . gabapentin (NEURONTIN) 100 MG capsule Take 2 capsules (200 mg total) by mouth 3 (three) times daily. 120 capsule 1  . hydrALAZINE (APRESOLINE) 25 MG tablet Take 2 tablets (50 mg total) by mouth 3 (three) times daily. 180 tablet 6  . HYDROcodone-acetaminophen (NORCO/VICODIN) 5-325 MG tablet Take 1 tablet by mouth every 6 (six) hours as needed for up to 15 doses. 15 tablet 0  . insulin glargine (LANTUS) 100 unit/mL SOPN Inject 0.14 mLs (14 Units total) into the skin daily after breakfast. (Patient taking differently: Inject 10 Units into the skin daily after breakfast. ) 15 mL 11  . Insulin Pen Needle 29G X 5MM MISC Use as directed 200 each 0  . latanoprost (XALATAN) 0.005 % ophthalmic solution Place 1 drop into both eyes at bedtime.     . lidocaine-prilocaine (EMLA) cream Apply a quarter size of cream to port 1-2 hours prior to access. Cover with saran wrap. 30 g 1  . Melatonin 10 MG CAPS Take 10 mg by mouth at bedtime as needed (sleep).    . metoprolol (LOPRESSOR) 50 MG tablet Take 50 mg by mouth 2 (two) times daily.     Marland Kitchen oxyCODONE-acetaminophen (PERCOCET/ROXICET) 5-325 MG tablet Take 1-2 tablets by mouth every 4 (four) hours as needed for severe pain. 30 tablet 0  . predniSONE (DELTASONE) 5 MG tablet 6 tab x 1 day, 5 tab x 1 day, 4 tab x 1  day, 3 tab x 1 day, 2 tab x 1 day, 1 tab x 1 day, stop 21 tablet 0  . prochlorperazine (COMPAZINE) 10 MG tablet Take 1 tablet (10 mg total) by mouth every 6 (six) hours as needed for nausea or vomiting. 30 tablet 1   Current Facility-Administered Medications on File Prior to Visit  Medication Dose Route Frequency Provider Last Rate Last Dose  . sodium chloride flush (NS) 0.9 % injection 10 mL  10 mL Intravenous PRN [name], MD         Impression and Plan:  62 year old woman with:   1. MDS diagnosed in 2017.  She presented with anemia and found to have with multilineage dysplasia with an  IPSS-R score is 2.5.   She completed 7 cycles of 5-azacytidine in April 2019.  Risks and benefits of continuing this treatment versus proceeding with supportive care was discussed today.  After discussion today, she opted to continue with supportive care only and monitor her counts moving forward.  Her transfusion need is largely unaffected and likely will she will require different treatment.  2.  Anemia: Related to MDS.  Her hemoglobin is 6.0 and she is symptomatic.  She will receive 2 units of packed red cell transfusion   3. Hypertension: Managed by her cardiologist.  Her blood pressure is within normal range today.   4.  Aortic stenosis: Asymptomatic at this time.  She follows with cardiology regarding this issue and no indication for repair at this time.  5.  Neutropenia: Related to MDS.  No active infection noted.  We will monitor her counts and consider growth factor support.  6.  Herpes zoster infection: Resolved at this time with some residual neuropathy.  No active treatment is needed.  7.  Postherpetic neuralgia: Her pain is manageable at this time.  Will consider to him in the future if her pain worsen.  8. Follow-up: Will be in 3 weeks to check her blood count and consider transfusion at the time.  15  minutes was spent with the patient face-to-face today.  More than 50% of time  was dedicated to patient counseling, education and discussing future plan of care.   Zola Button, MD 5/20/20199:06 AM

## 2017-12-01 NOTE — Patient Instructions (Signed)

## 2017-12-01 NOTE — Telephone Encounter (Signed)
Scheduled appt per 5/20 los - Gave patient  aVS and calender per los.  

## 2017-12-02 LAB — TYPE AND SCREEN
ABO/RH(D): A NEG
Antibody Screen: NEGATIVE
Unit division: 0
Unit division: 0

## 2017-12-02 LAB — BPAM RBC
Blood Product Expiration Date: 201906172359
Blood Product Expiration Date: 201906182359
ISSUE DATE / TIME: 201905200956
ISSUE DATE / TIME: 201905200956
Unit Type and Rh: 600
Unit Type and Rh: 600

## 2017-12-17 ENCOUNTER — Other Ambulatory Visit: Payer: Self-pay | Admitting: Medical

## 2017-12-17 DIAGNOSIS — B029 Zoster without complications: Secondary | ICD-10-CM

## 2017-12-18 NOTE — Progress Notes (Signed)
Disability completed and taken down to [name] for patient to pick up at front desk.

## 2017-12-22 ENCOUNTER — Other Ambulatory Visit: Payer: Self-pay | Admitting: *Deleted

## 2017-12-22 ENCOUNTER — Inpatient Hospital Stay: Payer: BC Managed Care – PPO | Attending: Oncology

## 2017-12-22 ENCOUNTER — Inpatient Hospital Stay: Payer: BC Managed Care – PPO

## 2017-12-22 ENCOUNTER — Inpatient Hospital Stay (HOSPITAL_BASED_OUTPATIENT_CLINIC_OR_DEPARTMENT_OTHER): Payer: BC Managed Care – PPO | Admitting: Oncology

## 2017-12-22 ENCOUNTER — Telehealth: Payer: Self-pay | Admitting: Oncology

## 2017-12-22 VITALS — BP 153/56 | HR 79 | Temp 98.0°F | Resp 18 | Ht 64.0 in | Wt 225.1 lb

## 2017-12-22 VITALS — BP 191/56 | HR 67 | Temp 97.7°F | Resp 16

## 2017-12-22 DIAGNOSIS — D649 Anemia, unspecified: Secondary | ICD-10-CM | POA: Insufficient documentation

## 2017-12-22 DIAGNOSIS — N189 Chronic kidney disease, unspecified: Secondary | ICD-10-CM

## 2017-12-22 DIAGNOSIS — D469 Myelodysplastic syndrome, unspecified: Secondary | ICD-10-CM

## 2017-12-22 DIAGNOSIS — Z9221 Personal history of antineoplastic chemotherapy: Secondary | ICD-10-CM

## 2017-12-22 DIAGNOSIS — D500 Iron deficiency anemia secondary to blood loss (chronic): Secondary | ICD-10-CM

## 2017-12-22 DIAGNOSIS — Z85038 Personal history of other malignant neoplasm of large intestine: Secondary | ICD-10-CM | POA: Diagnosis not present

## 2017-12-22 DIAGNOSIS — D631 Anemia in chronic kidney disease: Secondary | ICD-10-CM

## 2017-12-22 DIAGNOSIS — I10 Essential (primary) hypertension: Secondary | ICD-10-CM

## 2017-12-22 DIAGNOSIS — D709 Neutropenia, unspecified: Secondary | ICD-10-CM

## 2017-12-22 DIAGNOSIS — I350 Nonrheumatic aortic (valve) stenosis: Secondary | ICD-10-CM | POA: Diagnosis not present

## 2017-12-22 DIAGNOSIS — R600 Localized edema: Secondary | ICD-10-CM

## 2017-12-22 LAB — SAMPLE TO BLOOD BANK

## 2017-12-22 LAB — CBC WITH DIFFERENTIAL/PLATELET
Basophils Absolute: 0 10*3/uL (ref 0.0–0.1)
Basophils Relative: 1 %
Eosinophils Absolute: 0 10*3/uL (ref 0.0–0.5)
Eosinophils Relative: 2 %
HCT: 19.5 % — ABNORMAL LOW (ref 34.8–46.6)
Hemoglobin: 6.6 g/dL — CL (ref 11.6–15.9)
Lymphocytes Relative: 21 %
Lymphs Abs: 0.6 10*3/uL — ABNORMAL LOW (ref 0.9–3.3)
MCH: 36.8 pg — ABNORMAL HIGH (ref 25.1–34.0)
MCHC: 33.9 g/dL (ref 31.5–36.0)
MCV: 108.6 fL — ABNORMAL HIGH (ref 79.5–101.0)
Monocytes Absolute: 0.9 10*3/uL (ref 0.1–0.9)
Monocytes Relative: 36 %
Neutro Abs: 1.1 10*3/uL — ABNORMAL LOW (ref 1.5–6.5)
Neutrophils Relative %: 40 %
Platelets: 233 10*3/uL (ref 145–400)
RBC: 1.79 MIL/uL — ABNORMAL LOW (ref 3.70–5.45)
RDW: 35.3 % — ABNORMAL HIGH (ref 11.2–14.5)
WBC: 2.6 10*3/uL — ABNORMAL LOW (ref 3.9–10.3)

## 2017-12-22 LAB — COMPREHENSIVE METABOLIC PANEL
ALT: 18 U/L (ref 0–55)
AST: 35 U/L — ABNORMAL HIGH (ref 5–34)
Albumin: 3.8 g/dL (ref 3.5–5.0)
Alkaline Phosphatase: 127 U/L (ref 40–150)
Anion gap: 12 — ABNORMAL HIGH (ref 3–11)
BUN: 19 mg/dL (ref 7–26)
CO2: 24 mmol/L (ref 22–29)
Calcium: 9 mg/dL (ref 8.4–10.4)
Chloride: 99 mmol/L (ref 98–109)
Creatinine, Ser: 1.11 mg/dL — ABNORMAL HIGH (ref 0.60–1.10)
GFR calc Af Amer: 60 mL/min — ABNORMAL LOW (ref >60)
GFR calc non Af Amer: 52 mL/min — ABNORMAL LOW (ref >60)
Glucose, Bld: 150 mg/dL — ABNORMAL HIGH (ref 70–140)
Potassium: 4.5 mmol/L (ref 3.5–5.1)
Sodium: 135 mmol/L — ABNORMAL LOW (ref 136–145)
Total Bilirubin: 1.1 mg/dL (ref 0.2–1.2)
Total Protein: 7 g/dL (ref 6.4–8.3)

## 2017-12-22 LAB — PREPARE RBC (CROSSMATCH)

## 2017-12-22 MED ORDER — ACETAMINOPHEN 325 MG PO TABS
ORAL_TABLET | ORAL | Status: AC
  Filled 2017-12-22: qty 2

## 2017-12-22 MED ORDER — SODIUM CHLORIDE 0.9 % IV SOLN
250.0000 mL | Freq: Once | INTRAVENOUS | Status: AC
  Administered 2017-12-22: 250 mL via INTRAVENOUS

## 2017-12-22 MED ORDER — DIPHENHYDRAMINE HCL 25 MG PO CAPS
25.0000 mg | ORAL_CAPSULE | Freq: Once | ORAL | Status: AC
  Administered 2017-12-22: 25 mg via ORAL

## 2017-12-22 MED ORDER — DIPHENHYDRAMINE HCL 25 MG PO CAPS
ORAL_CAPSULE | ORAL | Status: AC
  Filled 2017-12-22: qty 1

## 2017-12-22 MED ORDER — FUROSEMIDE 10 MG/ML IJ SOLN
20.0000 mg | Freq: Once | INTRAMUSCULAR | Status: AC
  Administered 2017-12-22: 20 mg via INTRAVENOUS

## 2017-12-22 MED ORDER — ACETAMINOPHEN 325 MG PO TABS
650.0000 mg | ORAL_TABLET | Freq: Once | ORAL | Status: AC
  Administered 2017-12-22: 650 mg via ORAL

## 2017-12-22 MED ORDER — FUROSEMIDE 10 MG/ML IJ SOLN
INTRAMUSCULAR | Status: AC
  Filled 2017-12-22: qty 2

## 2017-12-22 NOTE — Progress Notes (Signed)
Hematology and Oncology Follow Up Visit  Patricia Mata [id number] 09/23/1955 62 y.o. 12/22/2017 9:38 AM   Principle Diagnosis: 62 year old woman with:   1.  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  She remains in remission since that time.  2.  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   Prior Therapy: She is S/P hemicolectomy done in 11/2006. 1/13 lymph nodes are positive.  This was followed by 12 cycles of FOLFOX completed in 05/2007  Aranesp 300 mcg every 2 to 3 weeks therapy was ineffective to eliminate transfusion needs.  5-azacytidine at 75 mg/m square subcutaneously started in October 2018.  She is status post 7 cycles of therapy completed in April 2019.   Current therapy: Supportive transfusions as needed.    Interim History:  Patricia Mata presents today for a follow-up.  Since the last visit, she reports no changes in her health and continues to enjoy reasonable quality of life.  She has not reported any further decline in her energy her performance status.  She denies any dyspnea on exertion or recurrence fatigue.  Her appetite remain excellent and she continues to attend activities of daily living.  She denies any recent hospitalization or recurrent infections.  She denies any bone pain or pathological fractures.   She she denied headaches blurry vision, syncope or seizures.  She denies any confusion or depression.  She does not report any fevers, chills sweats.  Her weight and appetite remain excellent. She does not report any chest pain, palpitation orthopnea or leg edema. She does not report any cough, wheezing or hemoptysis. She does not report any nausea, vomiting or abdominal pain.  She denies any hematochezia or melena.  She does not report any frequency urgency or hesitancy. She does not report any bone pain or arthralgias.  She denies any  lymphadenopathy or easy bruisability.  Remaining review of systems is negative.   Medications: I have reviewed the patient's current medications.   Allergies:  Allergies  Allergen Reactions  . Ancef [Cefazolin] Itching    Severe itching- after procedure, ancef was the antibiotic.-04/02/17  . Sulfa Antibiotics Rash and Other (See Comments)    blisters     Physical Exam:  Blood pressure (!) 153/56, pulse 79, temperature 98 F (36.7 C), temperature source Oral, resp. rate 18, height '5\' 4"'  (1.626 m), weight 225 lb 1.6 oz (102.1 kg), SpO2 99 %.   ECOG: 0 General appearance: Well-appearing woman appeared comfortable Head: Normocephalic without abnormalities.. Oropharynx: No ulcers or thrush. Eyes: Sclera anicteric. Lymph nodes: No cervical, axillary, inguinal or supraclavicular lymphadenopathy. Heart: Regular rate with systolic murmur noted. Lung: Clear all lung fields without any rhonchi, wheezes or dullness to percussion. Abdomin: Soft without any rebound or guarding.  No shifting dullness or ascites. Skin: No ecchymosis or petechiae. Musculoskeletal: No clubbing or cyanosis. Neurological: Intact motor, sensory or deep tendon reflexes.   Lab Results: Lab Results  Component Value Date   WBC 2.6 (L) 12/22/2017   HGB 6.6 (LL) 12/22/2017   HCT 19.5 (L) 12/22/2017   MCV 108.6 (H) 12/22/2017   PLT 233 12/22/2017     Chemistry      Component Value Date/Time   NA 130 (L) 12/01/2017 0838   NA 132 (L) 06/10/2017 1028   NA 134 (L) 05/26/2017 0949   K 3.9 12/01/2017 0838   K 4.0 05/26/2017  0949   CL 97 (L) 12/01/2017 0838   CL 96 (L) 03/31/2012 1513   CO2 22 12/01/2017 0838   CO2 24 05/26/2017 0949   BUN 22 12/01/2017 0838   BUN 23 06/10/2017 1028   BUN 29.2 (H) 05/26/2017 0949   CREATININE 1.38 (H) 12/01/2017 0838   CREATININE 1.4 (H) 05/26/2017 0949      Component Value Date/Time   CALCIUM 8.5 12/01/2017 0838   CALCIUM 8.9 05/26/2017 0949   ALKPHOS 115 12/01/2017 0838    ALKPHOS 157 (H) 05/26/2017 0949   AST 36 (H) 12/01/2017 0838   AST 29 05/26/2017 0949   ALT 27 12/01/2017 0838   ALT 23 05/26/2017 0949   BILITOT 1.1 12/01/2017 0838   BILITOT 2.19 (H) 05/26/2017 0949     Current Outpatient Medications on File Prior to Visit  Medication Sig Dispense Refill  . atorvastatin (LIPITOR) 40 MG tablet Take 40 mg by mouth daily.    . Azilsartan-Chlorthalidone (EDARBYCLOR) 40-12.5 MG TABS Take 1 tablet by mouth daily.     . colchicine 0.6 MG tablet Take 0.6 mg by mouth 2 (two) times daily as needed (gout flare).   2  . furosemide (LASIX) 20 MG tablet TAKE 1 TABLET BY MOUTH TWICE A DAY 60 tablet 0  . gabapentin (NEURONTIN) 100 MG capsule Take 2 capsules (200 mg total) by mouth 3 (three) times daily. 120 capsule 1  . hydrALAZINE (APRESOLINE) 25 MG tablet Take 2 tablets (50 mg total) by mouth 3 (three) times daily. 180 tablet 6  . HYDROcodone-acetaminophen (NORCO/VICODIN) 5-325 MG tablet Take 1 tablet by mouth every 6 (six) hours as needed for up to 15 doses. 15 tablet 0  . insulin glargine (LANTUS) 100 unit/mL SOPN Inject 0.14 mLs (14 Units total) into the skin daily after breakfast. (Patient taking differently: Inject 10 Units into the skin daily after breakfast. ) 15 mL 11  . Insulin Pen Needle 29G X 5MM MISC Use as directed 200 each 0  . latanoprost (XALATAN) 0.005 % ophthalmic solution Place 1 drop into both eyes at bedtime.     . lidocaine-prilocaine (EMLA) cream Apply a quarter size of cream to port 1-2 hours prior to access. Cover with saran wrap. 30 g 1  . Melatonin 10 MG CAPS Take 10 mg by mouth at bedtime as needed (sleep).    . metoprolol (LOPRESSOR) 50 MG tablet Take 50 mg by mouth 2 (two) times daily.     Marland Kitchen oxyCODONE-acetaminophen (PERCOCET/ROXICET) 5-325 MG tablet Take 1-2 tablets by mouth every 4 (four) hours as needed for severe pain. 30 tablet 0  . predniSONE (DELTASONE) 5 MG tablet 6 tab x 1 day, 5 tab x 1 day, 4 tab x 1 day, 3 tab x 1 day, 2 tab x  1 day, 1 tab x 1 day, stop 21 tablet 0  . prochlorperazine (COMPAZINE) 10 MG tablet Take 1 tablet (10 mg total) by mouth every 6 (six) hours as needed for nausea or vomiting. 30 tablet 1   Current Facility-Administered Medications on File Prior to Visit  Medication Dose Route Frequency Provider Last Rate Last Dose  . sodium chloride flush (NS) 0.9 % injection 10 mL  10 mL Intravenous PRN [name], MD         Impression and Plan:  62 year old woman with:   1. 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   She is status post a therapy with growth factor support  and 5-azacytidine as completed in April 2019.  She remains transfusion dependent although her transfusion needs have decreased.  The natural course of this disease as well as different treatment options were reviewed today and for the time being we will continue with supportive transfusion as needed every 4 to 5 weeks.  Additional therapy can be considered in the future.  2.  Anemia: Her hemoglobin today is at 6.6 which is better since the last month but does require transfusion.  Risks and benefits of continuing transfusion was discussed today and she is agreeable to receive it.   3. Hypertension: Blood pressure slightly elevated but for the most part control.   4.  Aortic stenosis: Followed by cardiology.  5.  Neutropenia: No recent infection noted.  Her neutropenia is related to MDS.  6.  Herpes zoster infection: Resolved and no postherpetic neuralgia noted.  7. Follow-up: Will be in 4 weeks to recheck her counts and she will receive transfusion if needed 2.  15  minutes was spent with the patient face-to-face today.  More than 50% of time was dedicated to patient counseling, education and coordinating her care including future treatment plans.    Zola Button, MD 6/10/20199:38 AM

## 2017-12-22 NOTE — Telephone Encounter (Signed)
Scheduled appt per 6/10 los - Gave pt AVS and calender per los.

## 2017-12-22 NOTE — Progress Notes (Signed)
Contacted Sandi Mealy PA after second unit of RBC's transfused due to elevated blood pressure. Received verbal order for 20 mg IV lasix to be administered prior to DC home.

## 2017-12-22 NOTE — Patient Instructions (Signed)

## 2017-12-23 LAB — BPAM RBC
Blood Product Expiration Date: 201906242359
Blood Product Expiration Date: 201906252359
ISSUE DATE / TIME: 201906101035
ISSUE DATE / TIME: 201906101035
Unit Type and Rh: 600
Unit Type and Rh: 600

## 2017-12-23 LAB — TYPE AND SCREEN
ABO/RH(D): A NEG
Antibody Screen: NEGATIVE
Unit division: 0
Unit division: 0

## 2018-01-09 ENCOUNTER — Other Ambulatory Visit: Payer: Self-pay | Admitting: Oncology

## 2018-01-13 ENCOUNTER — Telehealth: Payer: Self-pay | Admitting: Hematology

## 2018-01-13 ENCOUNTER — Other Ambulatory Visit: Payer: Self-pay | Admitting: *Deleted

## 2018-01-13 DIAGNOSIS — D509 Iron deficiency anemia, unspecified: Secondary | ICD-10-CM

## 2018-01-13 NOTE — Telephone Encounter (Signed)
Called patient regarding 7/4 °

## 2018-01-13 NOTE — Telephone Encounter (Signed)
Called patient regarding 7/3  She said afternoon would be better her husband has a doctor appointment

## 2018-01-14 ENCOUNTER — Other Ambulatory Visit: Payer: Self-pay | Admitting: *Deleted

## 2018-01-14 ENCOUNTER — Inpatient Hospital Stay: Payer: BC Managed Care – PPO | Attending: Oncology

## 2018-01-14 ENCOUNTER — Other Ambulatory Visit: Payer: Self-pay

## 2018-01-14 DIAGNOSIS — D649 Anemia, unspecified: Secondary | ICD-10-CM

## 2018-01-14 DIAGNOSIS — D709 Neutropenia, unspecified: Secondary | ICD-10-CM | POA: Diagnosis not present

## 2018-01-14 DIAGNOSIS — D509 Iron deficiency anemia, unspecified: Secondary | ICD-10-CM

## 2018-01-14 DIAGNOSIS — D469 Myelodysplastic syndrome, unspecified: Secondary | ICD-10-CM | POA: Insufficient documentation

## 2018-01-14 DIAGNOSIS — I10 Essential (primary) hypertension: Secondary | ICD-10-CM | POA: Insufficient documentation

## 2018-01-14 LAB — CBC WITH DIFFERENTIAL (CANCER CENTER ONLY)
Basophils Absolute: 0 10*3/uL (ref 0.0–0.1)
Basophils Relative: 2 %
Eosinophils Absolute: 0 10*3/uL (ref 0.0–0.5)
Eosinophils Relative: 1 %
HCT: 18.7 % — ABNORMAL LOW (ref 34.8–46.6)
Hemoglobin: 6.3 g/dL — CL (ref 11.6–15.9)
Lymphocytes Relative: 20 %
Lymphs Abs: 0.4 10*3/uL — ABNORMAL LOW (ref 0.9–3.3)
MCH: 35.9 pg — ABNORMAL HIGH (ref 25.1–34.0)
MCHC: 33.9 g/dL (ref 31.5–36.0)
MCV: 106.1 fL — ABNORMAL HIGH (ref 79.5–101.0)
Monocytes Absolute: 0.6 10*3/uL (ref 0.1–0.9)
Monocytes Relative: 28 %
Neutro Abs: 1.1 10*3/uL — ABNORMAL LOW (ref 1.5–6.5)
Neutrophils Relative %: 49 %
Platelet Count: 146 10*3/uL (ref 145–400)
RBC: 1.77 MIL/uL — ABNORMAL LOW (ref 3.70–5.45)
RDW: 33.5 % — ABNORMAL HIGH (ref 11.2–14.5)
WBC Count: 2.2 10*3/uL — ABNORMAL LOW (ref 3.9–10.3)

## 2018-01-14 LAB — CMP (CANCER CENTER ONLY)
ALT: 27 U/L (ref 0–44)
AST: 39 U/L (ref 15–41)
Albumin: 3.8 g/dL (ref 3.5–5.0)
Alkaline Phosphatase: 146 U/L — ABNORMAL HIGH (ref 38–126)
Anion gap: 12 (ref 5–15)
BUN: 22 mg/dL (ref 8–23)
CO2: 25 mmol/L (ref 22–32)
Calcium: 9.3 mg/dL (ref 8.9–10.3)
Chloride: 96 mmol/L — ABNORMAL LOW (ref 98–111)
Creatinine: 1.13 mg/dL — ABNORMAL HIGH (ref 0.44–1.00)
GFR, Est AFR Am: 59 mL/min — ABNORMAL LOW (ref >60)
GFR, Estimated: 51 mL/min — ABNORMAL LOW (ref >60)
Glucose, Bld: 173 mg/dL — ABNORMAL HIGH (ref 70–99)
Potassium: 3.8 mmol/L (ref 3.5–5.1)
Sodium: 133 mmol/L — ABNORMAL LOW (ref 135–145)
Total Bilirubin: 1.4 mg/dL — ABNORMAL HIGH (ref 0.3–1.2)
Total Protein: 7 g/dL (ref 6.5–8.1)

## 2018-01-14 LAB — SAMPLE TO BLOOD BANK

## 2018-01-14 LAB — PREPARE RBC (CROSSMATCH)

## 2018-01-15 ENCOUNTER — Inpatient Hospital Stay: Payer: BC Managed Care – PPO

## 2018-01-15 DIAGNOSIS — D649 Anemia, unspecified: Secondary | ICD-10-CM

## 2018-01-15 DIAGNOSIS — D509 Iron deficiency anemia, unspecified: Secondary | ICD-10-CM

## 2018-01-15 DIAGNOSIS — D469 Myelodysplastic syndrome, unspecified: Secondary | ICD-10-CM | POA: Diagnosis not present

## 2018-01-15 MED ORDER — SODIUM CHLORIDE 0.9 % IV SOLN
250.0000 mL | Freq: Once | INTRAVENOUS | Status: AC
  Administered 2018-01-15: 250 mL via INTRAVENOUS

## 2018-01-15 MED ORDER — FUROSEMIDE 20 MG PO TABS
20.0000 mg | ORAL_TABLET | ORAL | Status: AC
  Administered 2018-01-15: 20 mg via ORAL

## 2018-01-15 MED ORDER — DIPHENHYDRAMINE HCL 25 MG PO CAPS
ORAL_CAPSULE | ORAL | Status: AC
  Filled 2018-01-15: qty 1

## 2018-01-15 MED ORDER — DIPHENHYDRAMINE HCL 25 MG PO CAPS
25.0000 mg | ORAL_CAPSULE | Freq: Once | ORAL | Status: AC
  Administered 2018-01-15: 25 mg via ORAL

## 2018-01-15 MED ORDER — ACETAMINOPHEN 325 MG PO TABS
ORAL_TABLET | ORAL | Status: AC
  Filled 2018-01-15: qty 2

## 2018-01-15 MED ORDER — ACETAMINOPHEN 325 MG PO TABS
650.0000 mg | ORAL_TABLET | Freq: Once | ORAL | Status: AC
  Administered 2018-01-15: 650 mg via ORAL

## 2018-01-15 MED ORDER — SODIUM CHLORIDE 0.9% FLUSH
3.0000 mL | INTRAVENOUS | Status: DC | PRN
  Filled 2018-01-15: qty 10

## 2018-01-15 MED ORDER — FUROSEMIDE 20 MG PO TABS
ORAL_TABLET | ORAL | Status: AC
  Filled 2018-01-15: qty 1

## 2018-01-15 NOTE — Progress Notes (Signed)
Patient requested Lasix 20 mg to be administered following blood transfusion. She has a cardiac history and a history of bilateral extremity edema. On Call dr called. Dr. Burr Medico ordered 20mg  lasix PO to be given now.    BP elevated at discharge, asymptomatic. Patient states it is like this usually following transfusion. She has hypertension medication to take at home- she takes it 3 times a day and it is past due right now. She will monitor this at home and call with any symptoms or go to the ED.

## 2018-01-15 NOTE — Patient Instructions (Signed)

## 2018-01-16 LAB — BPAM RBC
Blood Product Expiration Date: 201907212359
Blood Product Expiration Date: 201907292359
ISSUE DATE / TIME: 201907040941
ISSUE DATE / TIME: 201907040941
Unit Type and Rh: 600
Unit Type and Rh: 600

## 2018-01-16 LAB — TYPE AND SCREEN
ABO/RH(D): A NEG
Antibody Screen: NEGATIVE
Unit division: 0
Unit division: 0

## 2018-01-20 ENCOUNTER — Telehealth: Payer: Self-pay | Admitting: Oncology

## 2018-01-20 NOTE — Telephone Encounter (Signed)
Called patient @ 10:18 am to inform her that her disability paperwork was ready for pick up and would be located at front desk receptionist.

## 2018-01-22 ENCOUNTER — Telehealth: Payer: Self-pay

## 2018-01-22 NOTE — Telephone Encounter (Signed)
Pt called to request a refill for lasix. Noted medication reordered on 01/13/18 by Dr. Alen Blew for 60 pills (month's supply). Pt to take 1 tablet twice daily. Called pt who said that she will call the pharmacy to see if ready for p/u. Told pt to call back if there was any issue. Pt verbalized understanding and thanks for the communication.

## 2018-01-22 NOTE — Telephone Encounter (Signed)
Received call back from pt that [facility] had not received lasix ordered on 01/13/18. Called pharmacy and spoke with associate who voiced agreement that the medication order was not received. Gave verbal order as written by Dr. Alen Blew for furosemide (Lasix) 20mg  tablet. 60 pills. Pt to take 1 tablet by mouth twice a day. Called pt to confirm that the order was placed.

## 2018-01-26 ENCOUNTER — Inpatient Hospital Stay: Payer: BC Managed Care – PPO | Admitting: Oncology

## 2018-01-26 ENCOUNTER — Inpatient Hospital Stay: Payer: BC Managed Care – PPO

## 2018-01-26 ENCOUNTER — Telehealth: Payer: Self-pay | Admitting: Oncology

## 2018-01-26 NOTE — Telephone Encounter (Signed)
Patient called to cancel appt. Patient will call to r/s appts

## 2018-01-29 ENCOUNTER — Telehealth: Payer: Self-pay | Admitting: Oncology

## 2018-01-29 ENCOUNTER — Telehealth: Payer: Self-pay | Admitting: *Deleted

## 2018-01-29 NOTE — Telephone Encounter (Signed)
Patient calling to r/s missed appt 01/26/18.

## 2018-01-29 NOTE — Telephone Encounter (Signed)
Called pt re adding appt for tomorrow per 7/18 sch msg - spoke w/ pt and confirmed appt

## 2018-01-30 ENCOUNTER — Telehealth: Payer: Self-pay

## 2018-01-30 ENCOUNTER — Other Ambulatory Visit: Payer: Self-pay | Admitting: *Deleted

## 2018-01-30 ENCOUNTER — Encounter: Payer: Self-pay | Admitting: Oncology

## 2018-01-30 ENCOUNTER — Other Ambulatory Visit: Payer: Self-pay | Admitting: Oncology

## 2018-01-30 ENCOUNTER — Inpatient Hospital Stay: Payer: BC Managed Care – PPO

## 2018-01-30 ENCOUNTER — Inpatient Hospital Stay (HOSPITAL_BASED_OUTPATIENT_CLINIC_OR_DEPARTMENT_OTHER): Payer: BC Managed Care – PPO | Admitting: Oncology

## 2018-01-30 VITALS — BP 165/51 | HR 60 | Temp 98.3°F | Resp 17 | Ht 64.0 in | Wt 207.1 lb

## 2018-01-30 DIAGNOSIS — D469 Myelodysplastic syndrome, unspecified: Secondary | ICD-10-CM

## 2018-01-30 DIAGNOSIS — D649 Anemia, unspecified: Secondary | ICD-10-CM

## 2018-01-30 DIAGNOSIS — D709 Neutropenia, unspecified: Secondary | ICD-10-CM | POA: Diagnosis not present

## 2018-01-30 DIAGNOSIS — I10 Essential (primary) hypertension: Secondary | ICD-10-CM | POA: Diagnosis not present

## 2018-01-30 LAB — CBC WITH DIFFERENTIAL (CANCER CENTER ONLY)
Basophils Absolute: 0 10*3/uL (ref 0.0–0.1)
Basophils Relative: 0 %
Eosinophils Absolute: 0 10*3/uL (ref 0.0–0.5)
Eosinophils Relative: 1 %
HCT: 20 % — ABNORMAL LOW (ref 34.8–46.6)
Hemoglobin: 6.9 g/dL — CL (ref 11.6–15.9)
Lymphocytes Relative: 29 %
Lymphs Abs: 0.7 10*3/uL — ABNORMAL LOW (ref 0.9–3.3)
MCH: 34 pg (ref 25.1–34.0)
MCHC: 34.5 g/dL (ref 31.5–36.0)
MCV: 98.5 fL (ref 79.5–101.0)
Monocytes Absolute: 0.8 10*3/uL (ref 0.1–0.9)
Monocytes Relative: 33 %
Neutro Abs: 0.8 10*3/uL — ABNORMAL LOW (ref 1.5–6.5)
Neutrophils Relative %: 37 %
Platelet Count: 105 10*3/uL — ABNORMAL LOW (ref 145–400)
RBC: 2.03 MIL/uL — ABNORMAL LOW (ref 3.70–5.45)
RDW: 28.3 % — ABNORMAL HIGH (ref 11.2–14.5)
WBC Count: 2.3 10*3/uL — ABNORMAL LOW (ref 3.9–10.3)

## 2018-01-30 LAB — CMP (CANCER CENTER ONLY)
ALT: 31 U/L (ref 0–44)
AST: 54 U/L — ABNORMAL HIGH (ref 15–41)
Albumin: 3.8 g/dL (ref 3.5–5.0)
Alkaline Phosphatase: 166 U/L — ABNORMAL HIGH (ref 38–126)
Anion gap: 13 (ref 5–15)
BUN: 18 mg/dL (ref 8–23)
CO2: 25 mmol/L (ref 22–32)
Calcium: 9.2 mg/dL (ref 8.9–10.3)
Chloride: 91 mmol/L — ABNORMAL LOW (ref 98–111)
Creatinine: 1.2 mg/dL — ABNORMAL HIGH (ref 0.44–1.00)
GFR, Est AFR Am: 55 mL/min — ABNORMAL LOW (ref >60)
GFR, Estimated: 47 mL/min — ABNORMAL LOW (ref >60)
Glucose, Bld: 150 mg/dL — ABNORMAL HIGH (ref 70–99)
Potassium: 3.9 mmol/L (ref 3.5–5.1)
Sodium: 129 mmol/L — ABNORMAL LOW (ref 135–145)
Total Bilirubin: 1.2 mg/dL (ref 0.3–1.2)
Total Protein: 7.3 g/dL (ref 6.5–8.1)

## 2018-01-30 LAB — SAMPLE TO BLOOD BANK

## 2018-01-30 LAB — PREPARE RBC (CROSSMATCH)

## 2018-01-30 NOTE — Progress Notes (Signed)
Hematology and Oncology Follow Up Visit  Patricia Mata [id number] 10-10-1955 62 y.o. 01/30/2018 12:26 PM   Principle Diagnosis: 62 year old woman with:   1.  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  She remains in remission since that time.  2.  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   Prior Therapy: She is S/P hemicolectomy done in 11/2006. 1/13 lymph nodes are positive.  This was followed by 12 cycles of FOLFOX completed in 05/2007  Aranesp 300 mcg every 2 to 3 weeks therapy was ineffective to eliminate transfusion needs.  5-azacytidine at 75 mg/m square subcutaneously started in October 2018.  She is status post 7 cycles of therapy completed in April 2019.  Current therapy: Supportive transfusions as needed.  Interim History:  Patricia Mata presents today for a follow-up.  Since the last visit, she reports no changes in her health and continues to enjoy reasonable quality of life.  She has not reported any further decline in her energy her performance status.  She denies any dyspnea on exertion or recurrence fatigue.  Her appetite remain excellent and she continues to attend activities of daily living.  She denies any recent hospitalization or recurrent infections.  She denies any bone pain or pathological fractures.  She she denied headaches blurry vision, syncope or seizures.  She denies any confusion or depression.  She does not report any fevers, chills sweats.  Her weight and appetite remain excellent. She does not report any chest pain, palpitation orthopnea or leg edema. She does not report any cough, wheezing or hemoptysis. She does not report any nausea, vomiting or abdominal pain.  She denies any hematochezia or melena.  She does not report any frequency urgency or hesitancy. She does not report any bone pain or arthralgias.  She denies any  lymphadenopathy or easy bruisability.  Remaining review of systems is negative.   Medications: I have reviewed the patient's current medications.   Allergies:  Allergies  Allergen Reactions  . Ancef [Cefazolin] Itching    Severe itching- after procedure, ancef was the antibiotic.-04/02/17  . Sulfa Antibiotics Rash and Other (See Comments)    blisters     Physical Exam:  Blood pressure (!) 165/51, pulse 60, temperature 98.3 F (36.8 C), temperature source Oral, resp. rate 17, height '5\' 4"'  (1.626 m), weight 207 lb 1.6 oz (93.9 kg), SpO2 100 %.   ECOG: 0 General appearance: Well-appearing woman appeared comfortable Head: Normocephalic without abnormalities.. Oropharynx: No ulcers or thrush. Eyes: Sclera anicteric. Lymph nodes: No cervical, axillary, inguinal or supraclavicular lymphadenopathy. Heart: Regular rate with systolic murmur noted. Lung: Clear all lung fields without any rhonchi, wheezes or dullness to percussion. Abdomin: Soft without any rebound or guarding.  No shifting dullness or ascites. Skin: No ecchymosis or petechiae. Musculoskeletal: No clubbing or cyanosis. Neurological: Intact motor, sensory or deep tendon reflexes.   Lab Results: Lab Results  Component Value Date   WBC 2.3 (L) 01/30/2018   HGB 6.9 (LL) 01/30/2018   HCT 20.0 (L) 01/30/2018   MCV 98.5 01/30/2018   PLT 105 (L) 01/30/2018     Chemistry      Component Value Date/Time   NA 129 (L) 01/30/2018 0917   NA 132 (L) 06/10/2017 1028   NA 134 (L) 05/26/2017 0949   K 3.9 01/30/2018 0917   K 4.0 05/26/2017 0949   CL  91 (L) 01/30/2018 0917   CL 96 (L) 03/31/2012 1513   CO2 25 01/30/2018 0917   CO2 24 05/26/2017 0949   BUN 18 01/30/2018 0917   BUN 23 06/10/2017 1028   BUN 29.2 (H) 05/26/2017 0949   CREATININE 1.20 (H) 01/30/2018 0917   CREATININE 1.4 (H) 05/26/2017 0949      Component Value Date/Time   CALCIUM 9.2 01/30/2018 0917   CALCIUM 8.9 05/26/2017 0949   ALKPHOS 166 (H) 01/30/2018  0917   ALKPHOS 157 (H) 05/26/2017 0949   AST 54 (H) 01/30/2018 0917   AST 29 05/26/2017 0949   ALT 31 01/30/2018 0917   ALT 23 05/26/2017 0949   BILITOT 1.2 01/30/2018 0917   BILITOT 2.19 (H) 05/26/2017 0949     Current Outpatient Medications on File Prior to Visit  Medication Sig Dispense Refill  . atorvastatin (LIPITOR) 40 MG tablet Take 40 mg by mouth daily.    . Azilsartan-Chlorthalidone (EDARBYCLOR) 40-12.5 MG TABS Take 1 tablet by mouth daily.     . colchicine 0.6 MG tablet Take 0.6 mg by mouth 2 (two) times daily as needed (gout flare).   2  . furosemide (LASIX) 20 MG tablet TAKE 1 TABLET BY MOUTH TWICE A DAY 60 tablet 0  . gabapentin (NEURONTIN) 100 MG capsule Take 2 capsules (200 mg total) by mouth 3 (three) times daily. 120 capsule 1  . hydrALAZINE (APRESOLINE) 25 MG tablet Take 2 tablets (50 mg total) by mouth 3 (three) times daily. 180 tablet 6  . insulin glargine (LANTUS) 100 unit/mL SOPN Inject 0.14 mLs (14 Units total) into the skin daily after breakfast. (Patient taking differently: Inject 10 Units into the skin daily after breakfast. ) 15 mL 11  . Insulin Pen Needle 29G X 5MM MISC Use as directed 200 each 0  . latanoprost (XALATAN) 0.005 % ophthalmic solution Place 1 drop into both eyes at bedtime.     . lidocaine-prilocaine (EMLA) cream Apply a quarter size of cream to port 1-2 hours prior to access. Cover with saran wrap. 30 g 1  . Melatonin 10 MG CAPS Take 10 mg by mouth at bedtime as needed (sleep).    . metoprolol (LOPRESSOR) 50 MG tablet Take 50 mg by mouth 2 (two) times daily.     Marland Kitchen oxyCODONE-acetaminophen (PERCOCET/ROXICET) 5-325 MG tablet Take 1-2 tablets by mouth every 4 (four) hours as needed for severe pain. 30 tablet 0  . predniSONE (DELTASONE) 5 MG tablet 6 tab x 1 day, 5 tab x 1 day, 4 tab x 1 day, 3 tab x 1 day, 2 tab x 1 day, 1 tab x 1 day, stop 21 tablet 0  . prochlorperazine (COMPAZINE) 10 MG tablet Take 1 tablet (10 mg total) by mouth every 6 (six) hours  as needed for nausea or vomiting. 30 tablet 1   Current Facility-Administered Medications on File Prior to Visit  Medication Dose Route Frequency Provider Last Rate Last Dose  . sodium chloride flush (NS) 0.9 % injection 10 mL  10 mL Intravenous PRN [name], MD         Impression and Plan:  62 year old woman with:   1. 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   She is status post a therapy with growth factor support and 5-azacytidine completed in April 2019.  She remains transfusion dependent although her transfusion needs have decreased.  The natural course of this disease as well as different treatment  options were reviewed today and for the time being we will continue with supportive transfusion as needed every 4 to 5 weeks.  Additional therapy can be considered in the future.  2.  Anemia: Her hemoglobin today is at 6.9 which does require transfusion.  Risks and benefits of continuing transfusion was discussed today and she is agreeable to receive it.  The patient has been set up for 2 units of packed red blood cells on 01/31/2018.  Orders have been entered.  3. Hypertension: Blood pressure slightly elevated but for the most part controlled.   4.  Aortic stenosis: Followed by cardiology.  5.  Neutropenia: No recent infection noted.  Her neutropenia is related to MDS.  6.  Herpes zoster infection: Resolved and no postherpetic neuralgia noted.  7. Follow-up: Will be in 4 weeks to recheck her counts and she will receive transfusion if needed.   Orders Placed This Encounter  Procedures  . Practitioner attestation of consent    I, the ordering practitioner, attest that I have discussed with the patient the benefits, risks, side effects, alternatives, likelihood of achieving goals and potential problems during recovery for the procedure listed.    Standing Status:   Future    Standing Expiration Date:   01/30/2019    Order Specific Question:    Procedure    Answer:   Blood Product(s)  . Complete patient signature process for consent form    Standing Status:   Future    Standing Expiration Date:   01/30/2019  . Care order/instruction    Transfuse Parameters    Standing Status:   Future    Standing Expiration Date:   01/30/2019  . Type and screen    Standing Status:   Future    Number of Occurrences:   1    Standing Expiration Date:   01/31/2019  . Prepare RBC    Standing Status:   Standing    Number of Occurrences:   1    Order Specific Question:   # of Units    Answer:   2 units    Order Specific Question:   Transfusion Indications    Answer:   Symptomatic Anemia    Order Specific Question:   If emergent release call blood bank    Answer:   Not emergent release   Mikey Bussing, DNP, AGPCNP-BC, AOCNP 7/19/201912:26 PM

## 2018-01-30 NOTE — Telephone Encounter (Signed)
Printed avs and calender of upcoming appointment. Per 7/19 los. Patient is aware to arrive at 8am for this appointment. Approved by Threasa Beards

## 2018-01-31 ENCOUNTER — Inpatient Hospital Stay: Payer: BC Managed Care – PPO

## 2018-01-31 DIAGNOSIS — D469 Myelodysplastic syndrome, unspecified: Secondary | ICD-10-CM

## 2018-01-31 MED ORDER — ACETAMINOPHEN 325 MG PO TABS
650.0000 mg | ORAL_TABLET | Freq: Once | ORAL | Status: AC
  Administered 2018-01-31: 650 mg via ORAL

## 2018-01-31 MED ORDER — FUROSEMIDE 20 MG PO TABS
ORAL_TABLET | ORAL | Status: AC
  Filled 2018-01-31: qty 1

## 2018-01-31 MED ORDER — SODIUM CHLORIDE 0.9 % IV SOLN
250.0000 mL | Freq: Once | INTRAVENOUS | Status: AC
  Administered 2018-01-31: 250 mL via INTRAVENOUS

## 2018-01-31 MED ORDER — FUROSEMIDE 20 MG PO TABS
20.0000 mg | ORAL_TABLET | Freq: Every day | ORAL | Status: DC
  Administered 2018-01-31: 20 mg via ORAL

## 2018-01-31 MED ORDER — DIPHENHYDRAMINE HCL 25 MG PO CAPS
ORAL_CAPSULE | ORAL | Status: AC
  Filled 2018-01-31: qty 1

## 2018-01-31 MED ORDER — ACETAMINOPHEN 325 MG PO TABS
ORAL_TABLET | ORAL | Status: AC
  Filled 2018-01-31: qty 2

## 2018-01-31 MED ORDER — DIPHENHYDRAMINE HCL 25 MG PO CAPS
25.0000 mg | ORAL_CAPSULE | Freq: Once | ORAL | Status: AC
  Administered 2018-01-31: 25 mg via ORAL

## 2018-01-31 NOTE — Patient Instructions (Signed)

## 2018-01-31 NOTE — Progress Notes (Signed)
Pt. Requested Lasix to be given after blood transfusion. Pt. States she has prior cardiac condition. TC to Dr. Alen Blew to get verbal order for Lasix, Lasix 20 mg ordered Po and given to Pt.

## 2018-02-01 LAB — BPAM RBC
Blood Product Expiration Date: 201907292359
Blood Product Expiration Date: 201908162359
ISSUE DATE / TIME: 201907200832
ISSUE DATE / TIME: 201907200832
Unit Type and Rh: 600
Unit Type and Rh: 600

## 2018-02-01 LAB — TYPE AND SCREEN
ABO/RH(D): A NEG
Antibody Screen: NEGATIVE
Unit division: 0
Unit division: 0

## 2018-02-19 ENCOUNTER — Other Ambulatory Visit: Payer: Self-pay | Admitting: Cardiovascular Disease

## 2018-02-19 NOTE — Progress Notes (Signed)
FMLA completed and taken to [name] at front desk for patient to pick up. 

## 2018-02-20 ENCOUNTER — Other Ambulatory Visit: Payer: Self-pay | Admitting: Oncology

## 2018-03-06 ENCOUNTER — Inpatient Hospital Stay (HOSPITAL_BASED_OUTPATIENT_CLINIC_OR_DEPARTMENT_OTHER): Payer: BC Managed Care – PPO | Admitting: Oncology

## 2018-03-06 ENCOUNTER — Other Ambulatory Visit: Payer: Self-pay | Admitting: *Deleted

## 2018-03-06 ENCOUNTER — Inpatient Hospital Stay: Payer: BC Managed Care – PPO | Attending: Oncology

## 2018-03-06 ENCOUNTER — Inpatient Hospital Stay: Payer: BC Managed Care – PPO

## 2018-03-06 DIAGNOSIS — I10 Essential (primary) hypertension: Secondary | ICD-10-CM | POA: Diagnosis not present

## 2018-03-06 DIAGNOSIS — D509 Iron deficiency anemia, unspecified: Secondary | ICD-10-CM

## 2018-03-06 DIAGNOSIS — D469 Myelodysplastic syndrome, unspecified: Secondary | ICD-10-CM

## 2018-03-06 DIAGNOSIS — R630 Anorexia: Secondary | ICD-10-CM | POA: Insufficient documentation

## 2018-03-06 DIAGNOSIS — I350 Nonrheumatic aortic (valve) stenosis: Secondary | ICD-10-CM | POA: Diagnosis not present

## 2018-03-06 DIAGNOSIS — D649 Anemia, unspecified: Secondary | ICD-10-CM

## 2018-03-06 LAB — CMP (CANCER CENTER ONLY)
ALT: 33 U/L (ref 0–44)
AST: 51 U/L — ABNORMAL HIGH (ref 15–41)
Albumin: 3.8 g/dL (ref 3.5–5.0)
Alkaline Phosphatase: 122 U/L (ref 38–126)
Anion gap: 11 (ref 5–15)
BUN: 49 mg/dL — ABNORMAL HIGH (ref 8–23)
CO2: 26 mmol/L (ref 22–32)
Calcium: 9.2 mg/dL (ref 8.9–10.3)
Chloride: 92 mmol/L — ABNORMAL LOW (ref 98–111)
Creatinine: 1.5 mg/dL — ABNORMAL HIGH (ref 0.44–1.00)
GFR, Est AFR Am: 42 mL/min — ABNORMAL LOW (ref >60)
GFR, Estimated: 36 mL/min — ABNORMAL LOW (ref >60)
Glucose, Bld: 148 mg/dL — ABNORMAL HIGH (ref 70–99)
Potassium: 4.1 mmol/L (ref 3.5–5.1)
Sodium: 129 mmol/L — ABNORMAL LOW (ref 135–145)
Total Bilirubin: 1.1 mg/dL (ref 0.3–1.2)
Total Protein: 7.4 g/dL (ref 6.5–8.1)

## 2018-03-06 LAB — SAMPLE TO BLOOD BANK

## 2018-03-06 LAB — CBC WITH DIFFERENTIAL (CANCER CENTER ONLY)
Basophils Absolute: 0 10*3/uL (ref 0.0–0.1)
Basophils Relative: 0 %
Eosinophils Absolute: 0 10*3/uL (ref 0.0–0.5)
Eosinophils Relative: 1 %
HCT: 15.6 % — ABNORMAL LOW (ref 34.8–46.6)
Hemoglobin: 5.4 g/dL — CL (ref 11.6–15.9)
Lymphocytes Relative: 14 %
Lymphs Abs: 0.4 10*3/uL — ABNORMAL LOW (ref 0.9–3.3)
MCH: 34.2 pg — ABNORMAL HIGH (ref 25.1–34.0)
MCHC: 34.6 g/dL (ref 31.5–36.0)
MCV: 98.7 fL (ref 79.5–101.0)
Monocytes Absolute: 0.8 10*3/uL (ref 0.1–0.9)
Monocytes Relative: 27 %
Neutro Abs: 1.7 10*3/uL (ref 1.5–6.5)
Neutrophils Relative %: 58 %
Platelet Count: 195 10*3/uL (ref 145–400)
RBC: 1.58 MIL/uL — ABNORMAL LOW (ref 3.70–5.45)
RDW: 26.4 % — ABNORMAL HIGH (ref 11.2–14.5)
WBC Count: 2.9 10*3/uL — ABNORMAL LOW (ref 3.9–10.3)

## 2018-03-06 LAB — PREPARE RBC (CROSSMATCH)

## 2018-03-06 MED ORDER — ACETAMINOPHEN 325 MG PO TABS
ORAL_TABLET | ORAL | Status: AC
  Filled 2018-03-06: qty 2

## 2018-03-06 MED ORDER — FUROSEMIDE 10 MG/ML IJ SOLN
INTRAMUSCULAR | Status: AC
  Filled 2018-03-06: qty 2

## 2018-03-06 MED ORDER — SODIUM CHLORIDE 0.9% IV SOLUTION
250.0000 mL | Freq: Once | INTRAVENOUS | Status: AC
  Administered 2018-03-06: 250 mL via INTRAVENOUS
  Filled 2018-03-06: qty 250

## 2018-03-06 MED ORDER — ACETAMINOPHEN 325 MG PO TABS
650.0000 mg | ORAL_TABLET | Freq: Once | ORAL | Status: AC
  Administered 2018-03-06: 650 mg via ORAL

## 2018-03-06 MED ORDER — FUROSEMIDE 20 MG PO TABS: 20.0000 mg | ORAL_TABLET | Freq: Once | ORAL | Status: DC

## 2018-03-06 MED ORDER — DIPHENHYDRAMINE HCL 25 MG PO CAPS
25.0000 mg | ORAL_CAPSULE | Freq: Once | ORAL | Status: AC
  Administered 2018-03-06: 25 mg via ORAL

## 2018-03-06 MED ORDER — MEGESTROL ACETATE 400 MG/10ML PO SUSP: 400.0000 mg | Freq: Two times a day (BID) | ORAL | 1 refills | Status: DC

## 2018-03-06 MED ORDER — FUROSEMIDE 10 MG/ML IJ SOLN
20.0000 mg | Freq: Once | INTRAMUSCULAR | Status: AC
  Administered 2018-03-06: 20 mg via INTRAVENOUS

## 2018-03-06 MED ORDER — DIPHENHYDRAMINE HCL 25 MG PO CAPS
ORAL_CAPSULE | ORAL | Status: AC
  Filled 2018-03-06: qty 1

## 2018-03-06 NOTE — Patient Instructions (Signed)

## 2018-03-06 NOTE — Progress Notes (Signed)
Hematology and Oncology Follow Up Visit  Patricia Mata [id number] Sep 04, 1955 62 y.o. 03/06/2018 9:37 AM   Principle Diagnosis: 62 year old woman with:   1.  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  She remains in remission at this time.  2.  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  This was diagnosed in July 2017.   Prior Therapy: She is S/P hemicolectomy done in 11/2006. 1/13 lymph nodes are positive.  This was followed by 12 cycles of FOLFOX completed in 05/2007  Aranesp 300 mcg every 2 to 3 weeks therapy was ineffective to eliminate transfusion needs.  5-azacytidine at 75 mg/m square subcutaneously started in October 2018.  She is status post 7 cycles of therapy completed in April 2019.   Current therapy: Supportive transfusions.     Interim History:  Patricia Mata is here for a follow-up visit.  Since last visit, she reports no major changes in her health.  She does report increased fatigue and tiredness and mild dyspnea but no chest pain or shortness of breath.  Her performance status has declined and has fully retired from work.  She has also reported decline in her appetite and have lost more weight.  Some of her weight loss is intentional and some of it has to do with poor nutrition and poor appetite.  She denies any bone pain or pathological fractures.   She denied headaches blurry vision, syncope or seizures.  She denies any alteration in mental status or confusion.  She does not report any fevers, chills sweats. She does not report any chest pain, palpitation orthopnea or leg edema. She does not report any cough, wheezing or hemoptysis. She does not report any nausea, vomiting or abdominal pain.  She denies any changes in her bowel habits.  She does not report any frequency urgency or hesitancy. She does not report any logical bone fractures or discomfort.  She denies any  lymphadenopathy.  She denies any bleeding tendencies.  Remaining review of systems is negative.   Medications: I have reviewed the patient's current medications.   Allergies:  Allergies  Allergen Reactions  . Ancef [Cefazolin] Itching    Severe itching- after procedure, ancef was the antibiotic.-04/02/17  . Sulfa Antibiotics Rash and Other (See Comments)    blisters     Physical Exam:  Blood pressure (!) 112/35, pulse 65, temperature 98.5 F (36.9 C), temperature source Oral, resp. rate 18, height 5\' 4"  (1.626 m), weight 197 lb 12.8 oz (89.7 kg), SpO2 100 %.   ECOG: 1   General appearance: Alert, awake without any distress.  Chronically ill-appearing. Head: Atraumatic without abnormalities Oropharynx: Without any thrush or ulcers. Eyes: No scleral icterus. Lymph nodes: No lymphadenopathy noted in the cervical, supraclavicular, or axillary nodes Heart:regular rate and rhythm,  ejection murmur auscultated.  No leg edema. Lung: Clear to auscultation without any rhonchi, wheezes or dullness to percussion. Abdomin: Soft, nontender without any shifting dullness or ascites. Musculoskeletal: No clubbing or cyanosis. Neurological: No motor or sensory deficits. Skin: No rashes or lesions. Psychiatric: Mood and affect appeared normal.     Lab Results: Lab Results  Component Value Date   WBC 2.9 (L) 03/06/2018   HGB 5.4 (LL) 03/06/2018   HCT 15.6 (L) 03/06/2018   MCV 98.7 03/06/2018   PLT 195 03/06/2018     Chemistry      Component Value Date/Time   NA 129 (L)  01/30/2018 0917   NA 132 (L) 06/10/2017 1028   NA 134 (L) 05/26/2017 0949   K 3.9 01/30/2018 0917   K 4.0 05/26/2017 0949   CL 91 (L) 01/30/2018 0917   CL 96 (L) 03/31/2012 1513   CO2 25 01/30/2018 0917   CO2 24 05/26/2017 0949   BUN 18 01/30/2018 0917   BUN 23 06/10/2017 1028   BUN 29.2 (H) 05/26/2017 0949   CREATININE 1.20 (H) 01/30/2018 0917   CREATININE 1.4 (H) 05/26/2017 0949      Component Value  Date/Time   CALCIUM 9.2 01/30/2018 0917   CALCIUM 8.9 05/26/2017 0949   ALKPHOS 166 (H) 01/30/2018 0917   ALKPHOS 157 (H) 05/26/2017 0949   AST 54 (H) 01/30/2018 0917   AST 29 05/26/2017 0949   ALT 31 01/30/2018 0917   ALT 23 05/26/2017 0949   BILITOT 1.2 01/30/2018 0917   BILITOT 2.19 (H) 05/26/2017 0949     Current Outpatient Medications on File Prior to Visit  Medication Sig Dispense Refill  . atorvastatin (LIPITOR) 40 MG tablet Take 40 mg by mouth daily.    . Azilsartan-Chlorthalidone (EDARBYCLOR) 40-12.5 MG TABS Take 1 tablet by mouth daily.     . colchicine 0.6 MG tablet Take 0.6 mg by mouth 2 (two) times daily as needed (gout flare).   2  . furosemide (LASIX) 20 MG tablet TAKE 1 TABLET BY MOUTH TWICE A DAY 60 tablet 0  . gabapentin (NEURONTIN) 100 MG capsule Take 2 capsules (200 mg total) by mouth 3 (three) times daily. 120 capsule 1  . hydrALAZINE (APRESOLINE) 25 MG tablet Take 2 tablets (50 mg total) by mouth 3 (three) times daily. 540 tablet 3  . insulin glargine (LANTUS) 100 unit/mL SOPN Inject 0.14 mLs (14 Units total) into the skin daily after breakfast. (Patient taking differently: Inject 10 Units into the skin daily after breakfast. ) 15 mL 11  . Insulin Pen Needle 29G X 5MM MISC Use as directed 200 each 0  . latanoprost (XALATAN) 0.005 % ophthalmic solution Place 1 drop into both eyes at bedtime.     . lidocaine-prilocaine (EMLA) cream Apply a quarter size of cream to port 1-2 hours prior to access. Cover with saran wrap. 30 g 1  . Melatonin 10 MG CAPS Take 10 mg by mouth at bedtime as needed (sleep).    . metoprolol (LOPRESSOR) 50 MG tablet Take 50 mg by mouth 2 (two) times daily.     Marland Kitchen oxyCODONE-acetaminophen (PERCOCET/ROXICET) 5-325 MG tablet Take 1-2 tablets by mouth every 4 (four) hours as needed for severe pain. 30 tablet 0  . predniSONE (DELTASONE) 5 MG tablet 6 tab x 1 day, 5 tab x 1 day, 4 tab x 1 day, 3 tab x 1 day, 2 tab x 1 day, 1 tab x 1 day, stop 21 tablet 0   . prochlorperazine (COMPAZINE) 10 MG tablet Take 1 tablet (10 mg total) by mouth every 6 (six) hours as needed for nausea or vomiting. 30 tablet 1   Current Facility-Administered Medications on File Prior to Visit  Medication Dose Route Frequency Provider Last Rate Last Dose  . sodium chloride flush (NS) 0.9 % injection 10 mL  10 mL Intravenous PRN [name], MD         Impression and Plan:  62 year old woman with:   1. MDS diagnosed in 2017 after presenting with anemia.  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  She has received supportive transfusions as well as 5 azacitidine without really any impact on her transfusion needs.  The natural course of this disease was reviewed today and future treatment options were discussed.  For the time being, she opted to continue with active transfusion support and continue to monitor her moving forward.  Trial of Revlimid may be reasonable versus continued trans-supportive transfusion for now.  2.  Anemia: Her hemoglobin is down today and she is symptomatic.  We have 2 units of packed red cell transfusion.   3. Hypertension: Blood pressure normal at this time.   4.  Aortic stenosis: Followed by cardiology.  No evidence of heart failure noted.  5.  Poor appetite: Given prescription for Megace with instructions how to use it.  We have discussed importance of maintaining good nutrition.  6. Follow-up: Will be in 3 weeks for potential repeat transfusion.  15  minutes was spent with the patient face-to-face today.  More than 50% of time was dedicated to discussing the natural course of this disease, treatment options and managing her future care.   Zola Button, MD 8/23/20199:37 AM

## 2018-03-08 LAB — TYPE AND SCREEN
ABO/RH(D): A NEG
Antibody Screen: NEGATIVE
Unit division: 0
Unit division: 0

## 2018-03-08 LAB — BPAM RBC
Blood Product Expiration Date: 201908242359
Blood Product Expiration Date: 201909042359
ISSUE DATE / TIME: 201908231118
ISSUE DATE / TIME: 201908231226
Unit Type and Rh: 600
Unit Type and Rh: 600

## 2018-03-19 ENCOUNTER — Other Ambulatory Visit: Payer: Self-pay | Admitting: Oncology

## 2018-03-20 NOTE — Progress Notes (Signed)
Short term disability completed and taken to [name] at front desk to be picked up by patient.

## 2018-03-24 DIAGNOSIS — M25521 Pain in right elbow: Secondary | ICD-10-CM

## 2018-03-25 ENCOUNTER — Telehealth: Payer: Self-pay | Admitting: Oncology

## 2018-03-25 NOTE — Progress Notes (Signed)
Medical records and attending physician statement successfully faxed to [employer] at [phone].

## 2018-03-25 NOTE — Telephone Encounter (Signed)
Faxed medical records to [employer] [facility], Release ID: [id number]

## 2018-03-27 ENCOUNTER — Inpatient Hospital Stay: Payer: BC Managed Care – PPO

## 2018-03-27 ENCOUNTER — Inpatient Hospital Stay: Payer: BC Managed Care – PPO | Attending: Oncology

## 2018-03-27 ENCOUNTER — Other Ambulatory Visit: Payer: Self-pay | Admitting: *Deleted

## 2018-03-27 DIAGNOSIS — D469 Myelodysplastic syndrome, unspecified: Secondary | ICD-10-CM

## 2018-03-27 DIAGNOSIS — D649 Anemia, unspecified: Secondary | ICD-10-CM | POA: Diagnosis not present

## 2018-03-27 LAB — CMP (CANCER CENTER ONLY)
ALT: 29 U/L (ref 0–44)
AST: 39 U/L (ref 15–41)
Albumin: 3.7 g/dL (ref 3.5–5.0)
Alkaline Phosphatase: 100 U/L (ref 38–126)
Anion gap: 21 — ABNORMAL HIGH (ref 5–15)
BUN: 65 mg/dL — ABNORMAL HIGH (ref 8–23)
CO2: 12 mmol/L — ABNORMAL LOW (ref 22–32)
Calcium: 9.1 mg/dL (ref 8.9–10.3)
Chloride: 100 mmol/L (ref 98–111)
Creatinine: 2.09 mg/dL — ABNORMAL HIGH (ref 0.44–1.00)
GFR, Est AFR Am: 28 mL/min — ABNORMAL LOW (ref >60)
GFR, Estimated: 24 mL/min — ABNORMAL LOW (ref >60)
Glucose, Bld: 171 mg/dL — ABNORMAL HIGH (ref 70–99)
Potassium: 4.9 mmol/L (ref 3.5–5.1)
Sodium: 133 mmol/L — ABNORMAL LOW (ref 135–145)
Total Bilirubin: 1.5 mg/dL — ABNORMAL HIGH (ref 0.3–1.2)
Total Protein: 7.1 g/dL (ref 6.5–8.1)

## 2018-03-27 LAB — CBC WITH DIFFERENTIAL (CANCER CENTER ONLY)
Basophils Absolute: 0 10*3/uL (ref 0.0–0.1)
Basophils Relative: 0 %
Eosinophils Absolute: 0 10*3/uL (ref 0.0–0.5)
Eosinophils Relative: 0 %
HCT: 12.2 % — ABNORMAL LOW (ref 34.8–46.6)
Hemoglobin: 4.1 g/dL — CL (ref 11.6–15.9)
Lymphocytes Relative: 16 %
Lymphs Abs: 0.7 10*3/uL — ABNORMAL LOW (ref 0.9–3.3)
MCH: 33.3 pg (ref 25.1–34.0)
MCHC: 33.6 g/dL (ref 31.5–36.0)
MCV: 99.2 fL (ref 79.5–101.0)
Monocytes Absolute: 1 10*3/uL — ABNORMAL HIGH (ref 0.1–0.9)
Monocytes Relative: 22 %
Neutro Abs: 2.8 10*3/uL (ref 1.5–6.5)
Neutrophils Relative %: 62 %
Platelet Count: 199 10*3/uL (ref 145–400)
RBC: 1.23 MIL/uL — ABNORMAL LOW (ref 3.70–5.45)
RDW: 25 % — ABNORMAL HIGH (ref 11.2–14.5)
WBC Count: 4.6 10*3/uL (ref 3.9–10.3)

## 2018-03-27 LAB — SAMPLE TO BLOOD BANK

## 2018-03-27 LAB — PREPARE RBC (CROSSMATCH)

## 2018-03-27 MED ORDER — DIPHENHYDRAMINE HCL 25 MG PO CAPS
ORAL_CAPSULE | ORAL | Status: AC
  Filled 2018-03-27: qty 1

## 2018-03-27 MED ORDER — ACETAMINOPHEN 325 MG PO TABS
650.0000 mg | ORAL_TABLET | Freq: Once | ORAL | Status: AC
  Administered 2018-03-27: 650 mg via ORAL

## 2018-03-27 MED ORDER — FUROSEMIDE 10 MG/ML IJ SOLN
INTRAMUSCULAR | Status: AC
  Filled 2018-03-27: qty 2

## 2018-03-27 MED ORDER — FUROSEMIDE 10 MG/ML IJ SOLN
20.0000 mg | Freq: Once | INTRAMUSCULAR | Status: AC
  Administered 2018-03-27: 20 mg via INTRAVENOUS

## 2018-03-27 MED ORDER — ACETAMINOPHEN 325 MG PO TABS
ORAL_TABLET | ORAL | Status: AC
  Filled 2018-03-27: qty 2

## 2018-03-27 MED ORDER — DIPHENHYDRAMINE HCL 25 MG PO CAPS
25.0000 mg | ORAL_CAPSULE | Freq: Once | ORAL | Status: AC
  Administered 2018-03-27: 25 mg via ORAL

## 2018-03-27 MED ORDER — SODIUM CHLORIDE 0.9% IV SOLUTION
250.0000 mL | Freq: Once | INTRAVENOUS | Status: AC
  Administered 2018-03-27: 250 mL via INTRAVENOUS
  Filled 2018-03-27: qty 250

## 2018-03-27 NOTE — Patient Instructions (Signed)

## 2018-03-29 LAB — TYPE AND SCREEN
ABO/RH(D): A NEG
Antibody Screen: NEGATIVE
Unit division: 0
Unit division: 0

## 2018-03-29 LAB — BPAM RBC
Blood Product Expiration Date: 201910092359
Blood Product Expiration Date: 201910132359
ISSUE DATE / TIME: 201909131142
ISSUE DATE / TIME: 201909131142
Unit Type and Rh: 600
Unit Type and Rh: 600

## 2018-04-01 ENCOUNTER — Telehealth: Payer: Self-pay | Admitting: Cardiovascular Disease

## 2018-04-01 NOTE — Telephone Encounter (Signed)
04/01/2018 Received [employer] [place] statement form back from [facility] for Dr. Gwenlyn Found to sign form was given to [facility] for processing. cbr

## 2018-04-02 ENCOUNTER — Telehealth: Payer: Self-pay | Admitting: Oncology

## 2018-04-02 NOTE — Telephone Encounter (Signed)
Spoke with patient regarding appointments added date/time provided per 9/13 sch msg

## 2018-04-06 ENCOUNTER — Inpatient Hospital Stay: Payer: BC Managed Care – PPO

## 2018-04-06 ENCOUNTER — Telehealth: Payer: Self-pay | Admitting: Oncology

## 2018-04-06 ENCOUNTER — Inpatient Hospital Stay (HOSPITAL_BASED_OUTPATIENT_CLINIC_OR_DEPARTMENT_OTHER): Payer: BC Managed Care – PPO | Admitting: Oncology

## 2018-04-06 VITALS — BP 146/42 | HR 72 | Temp 98.6°F | Resp 17 | Ht 64.0 in | Wt 210.2 lb

## 2018-04-06 DIAGNOSIS — I10 Essential (primary) hypertension: Secondary | ICD-10-CM

## 2018-04-06 DIAGNOSIS — D469 Myelodysplastic syndrome, unspecified: Secondary | ICD-10-CM

## 2018-04-06 DIAGNOSIS — Z85038 Personal history of other malignant neoplasm of large intestine: Secondary | ICD-10-CM

## 2018-04-06 DIAGNOSIS — R630 Anorexia: Secondary | ICD-10-CM

## 2018-04-06 DIAGNOSIS — D649 Anemia, unspecified: Secondary | ICD-10-CM

## 2018-04-06 DIAGNOSIS — I350 Nonrheumatic aortic (valve) stenosis: Secondary | ICD-10-CM | POA: Diagnosis not present

## 2018-04-06 DIAGNOSIS — D509 Iron deficiency anemia, unspecified: Secondary | ICD-10-CM

## 2018-04-06 LAB — CMP (CANCER CENTER ONLY)
ALT: 18 U/L (ref 0–44)
AST: 22 U/L (ref 15–41)
Albumin: 3.5 g/dL (ref 3.5–5.0)
Alkaline Phosphatase: 84 U/L (ref 38–126)
Anion gap: 10 (ref 5–15)
BUN: 31 mg/dL — ABNORMAL HIGH (ref 8–23)
CO2: 21 mmol/L — ABNORMAL LOW (ref 22–32)
Calcium: 9.8 mg/dL (ref 8.9–10.3)
Chloride: 104 mmol/L (ref 98–111)
Creatinine: 1.04 mg/dL — ABNORMAL HIGH (ref 0.44–1.00)
GFR, Est AFR Am: >60 mL/min (ref >60)
GFR, Estimated: 56 mL/min — ABNORMAL LOW (ref >60)
Glucose, Bld: 205 mg/dL — ABNORMAL HIGH (ref 70–99)
Potassium: 4.4 mmol/L (ref 3.5–5.1)
Sodium: 135 mmol/L (ref 135–145)
Total Bilirubin: 0.9 mg/dL (ref 0.3–1.2)
Total Protein: 6.6 g/dL (ref 6.5–8.1)

## 2018-04-06 LAB — CBC WITH DIFFERENTIAL (CANCER CENTER ONLY)
Basophils Absolute: 0 10*3/uL (ref 0.0–0.1)
Basophils Relative: 1 %
Eosinophils Absolute: 0 10*3/uL (ref 0.0–0.5)
Eosinophils Relative: 1 %
HCT: 15.8 % — ABNORMAL LOW (ref 34.8–46.6)
Hemoglobin: 5.3 g/dL — CL (ref 11.6–15.9)
Lymphocytes Relative: 14 %
Lymphs Abs: 0.4 10*3/uL — ABNORMAL LOW (ref 0.9–3.3)
MCH: 31.7 pg (ref 25.1–34.0)
MCHC: 33.7 g/dL (ref 31.5–36.0)
MCV: 94 fL (ref 79.5–101.0)
Monocytes Absolute: 0.5 10*3/uL (ref 0.1–0.9)
Monocytes Relative: 15 %
Neutro Abs: 2.3 10*3/uL (ref 1.5–6.5)
Neutrophils Relative %: 69 %
Platelet Count: 144 10*3/uL — ABNORMAL LOW (ref 145–400)
RBC: 1.68 MIL/uL — ABNORMAL LOW (ref 3.70–5.45)
RDW: 16.5 % — ABNORMAL HIGH (ref 11.2–14.5)
WBC Count: 3.3 10*3/uL — ABNORMAL LOW (ref 3.9–10.3)

## 2018-04-06 LAB — SAMPLE TO BLOOD BANK

## 2018-04-06 LAB — PREPARE RBC (CROSSMATCH)

## 2018-04-06 MED ORDER — FUROSEMIDE 10 MG/ML IJ SOLN
INTRAMUSCULAR | Status: AC
  Filled 2018-04-06: qty 2

## 2018-04-06 MED ORDER — DIPHENHYDRAMINE HCL 25 MG PO CAPS
ORAL_CAPSULE | ORAL | Status: AC
  Filled 2018-04-06: qty 1

## 2018-04-06 MED ORDER — DIPHENHYDRAMINE HCL 25 MG PO CAPS
25.0000 mg | ORAL_CAPSULE | Freq: Once | ORAL | Status: AC
  Administered 2018-04-06: 25 mg via ORAL

## 2018-04-06 MED ORDER — FUROSEMIDE 10 MG/ML IJ SOLN
20.0000 mg | Freq: Once | INTRAMUSCULAR | Status: AC
  Administered 2018-04-06: 20 mg via INTRAVENOUS

## 2018-04-06 MED ORDER — SODIUM CHLORIDE 0.9% IV SOLUTION
250.0000 mL | Freq: Once | INTRAVENOUS | Status: AC
  Administered 2018-04-06: 250 mL via INTRAVENOUS
  Filled 2018-04-06: qty 250

## 2018-04-06 MED ORDER — ACETAMINOPHEN 325 MG PO TABS
ORAL_TABLET | ORAL | Status: AC
  Filled 2018-04-06: qty 2

## 2018-04-06 MED ORDER — ACETAMINOPHEN 325 MG PO TABS
650.0000 mg | ORAL_TABLET | Freq: Once | ORAL | Status: AC
  Administered 2018-04-06: 650 mg via ORAL

## 2018-04-06 NOTE — Patient Instructions (Signed)

## 2018-04-06 NOTE — Telephone Encounter (Signed)
Appts scheduled AVS/Calendar printed per 9/23 los °

## 2018-04-06 NOTE — Progress Notes (Signed)
Hematology and Oncology Follow Up Visit  Patricia Mata [id number] 11-02-1955 62 y.o. 04/06/2018 10:21 AM   Principle Diagnosis: 62 year old woman with:   1.T3N1 Ccolon cancer diagnosed in April 2008 without any evidence of recurrence at this time.  2.  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     Prior Therapy: She is S/P hemicolectomy done in 11/2006. 1/13 lymph nodes are positive.  This was followed by 12 cycles of FOLFOX completed in 05/2007  Aranesp 300 mcg every 2 to 3 weeks therapy was ineffective to eliminate transfusion needs.  5-azacytidine at 75 mg/m square subcutaneously started in October 2018.  She is status post 7 cycles of therapy completed in April 2019.   Current therapy: Supportive transfusions.     Interim History:  Mrs. Bedonie resents today for a visit.  Since her last visit, she reports feeling reasonably fair without any new complaints.  Her appetite is actually improved and has gained weight since the last visit.  Her mobility is reasonable and ambulating without any falls or unsteadiness.  She denies any chest pain or difficulty breathing.  She denies any lower extremity edema.  She is enjoying a reasonable quality of life at this time.  She denied headaches blurry vision, syncope or seizures.  She denies any dizziness or lethargy.  She does not report any fevers, chills sweats. She does not report any chest pain, palpitation orthopnea or leg edema. She does not report any cough, wheezing or hemoptysis. She does not report any nausea, vomiting or abdominal pain.  She denies any constipation or diarrhea.  She does not report any frequency urgency or hesitancy. She does not report any arthralgias or myalgias.  She denies any lymphadenopathy.  She denies any ecchymosis or easy bruising.  Remaining review of systems is negative.   Medications: I have reviewed the patient's current medications.    Allergies:  Allergies  Allergen Reactions  . Ancef [Cefazolin] Itching    Severe itching- after procedure, ancef was the antibiotic.-04/02/17  . Sulfa Antibiotics Rash and Other (See Comments)    blisters     Physical Exam:  Blood pressure (!) 146/42, pulse 72, temperature 98.6 F (37 C), temperature source Oral, resp. rate 17, height 5\' 4"  (1.626 m), weight 210 lb 3.2 oz (95.3 kg), SpO2 100 %.    ECOG: 1   General appearance: Comfortable appearing without any discomfort Head: Normocephalic without any trauma Oropharynx: Mucous membranes are moist and pink without any thrush or ulcers. Eyes: Pupils are equal and round reactive to light. Lymph nodes: No cervical, supraclavicular, inguinal or axillary lymphadenopathy.   Heart:regular rate and rhythm.  S1 and S2 without leg edema. Lung: Clear without any rhonchi or wheezes.  No dullness to percussion. Abdomin: Soft, nontender, nondistended with good bowel sounds.  No hepatosplenomegaly. Musculoskeletal: No joint deformity or effusion.  Full range of motion noted. Neurological: No deficits noted on motor, sensory and deep tendon reflex exam. Skin: No petechial rash or dryness.  Appeared moist.       Lab Results: Lab Results  Component Value Date   WBC 4.6 03/27/2018   HGB 4.1 (LL) 03/27/2018   HCT 12.2 (L) 03/27/2018   MCV 99.2 03/27/2018   PLT 199 03/27/2018     Chemistry      Component Value Date/Time   NA 133 (L) 03/27/2018 1034   NA 132 (L) 06/10/2017 1028   NA  134 (L) 05/26/2017 0949   K 4.9 03/27/2018 1034   K 4.0 05/26/2017 0949   CL 100 03/27/2018 1034   CL 96 (L) 03/31/2012 1513   CO2 12 (L) 03/27/2018 1034   CO2 24 05/26/2017 0949   BUN 65 (H) 03/27/2018 1034   BUN 23 06/10/2017 1028   BUN 29.2 (H) 05/26/2017 0949   CREATININE 2.09 (H) 03/27/2018 1034   CREATININE 1.4 (H) 05/26/2017 0949      Component Value Date/Time   CALCIUM 9.1 03/27/2018 1034   CALCIUM 8.9 05/26/2017 0949   ALKPHOS 100  03/27/2018 1034   ALKPHOS 157 (H) 05/26/2017 0949   AST 39 03/27/2018 1034   AST 29 05/26/2017 0949   ALT 29 03/27/2018 1034   ALT 23 05/26/2017 0949   BILITOT 1.5 (H) 03/27/2018 1034   BILITOT 2.19 (H) 05/26/2017 0949     Current Outpatient Medications on File Prior to Visit  Medication Sig Dispense Refill  . atorvastatin (LIPITOR) 40 MG tablet Take 40 mg by mouth daily.    . Azilsartan-Chlorthalidone (EDARBYCLOR) 40-12.5 MG TABS Take 1 tablet by mouth daily.     . colchicine 0.6 MG tablet Take 0.6 mg by mouth 2 (two) times daily as needed (gout flare).   2  . furosemide (LASIX) 20 MG tablet TAKE 1 TABLET BY MOUTH TWICE A DAY 60 tablet 0  . gabapentin (NEURONTIN) 100 MG capsule Take 2 capsules (200 mg total) by mouth 3 (three) times daily. 120 capsule 1  . hydrALAZINE (APRESOLINE) 25 MG tablet Take 2 tablets (50 mg total) by mouth 3 (three) times daily. 540 tablet 3  . insulin glargine (LANTUS) 100 unit/mL SOPN Inject 0.14 mLs (14 Units total) into the skin daily after breakfast. (Patient taking differently: Inject 10 Units into the skin daily after breakfast. ) 15 mL 11  . Insulin Pen Needle 29G X 5MM MISC Use as directed 200 each 0  . latanoprost (XALATAN) 0.005 % ophthalmic solution Place 1 drop into both eyes at bedtime.     . lidocaine-prilocaine (EMLA) cream Apply a quarter size of cream to port 1-2 hours prior to access. Cover with saran wrap. 30 g 1  . megestrol (MEGACE) 400 MG/10ML suspension Take 10 mLs (400 mg total) by mouth 2 (two) times daily. 240 mL 1  . Melatonin 10 MG CAPS Take 10 mg by mouth at bedtime as needed (sleep).    . metoprolol (LOPRESSOR) 50 MG tablet Take 50 mg by mouth 2 (two) times daily.     Marland Kitchen oxyCODONE-acetaminophen (PERCOCET/ROXICET) 5-325 MG tablet Take 1-2 tablets by mouth every 4 (four) hours as needed for severe pain. 30 tablet 0  . predniSONE (DELTASONE) 5 MG tablet 6 tab x 1 day, 5 tab x 1 day, 4 tab x 1 day, 3 tab x 1 day, 2 tab x 1 day, 1 tab x 1  day, stop 21 tablet 0  . prochlorperazine (COMPAZINE) 10 MG tablet Take 1 tablet (10 mg total) by mouth every 6 (six) hours as needed for nausea or vomiting. 30 tablet 1   Current Facility-Administered Medications on File Prior to Visit  Medication Dose Route Frequency Provider Last Rate Last Dose  . sodium chloride flush (NS) 0.9 % injection 10 mL  10 mL Intravenous PRN [name], MD         Impression and Plan:  62 year old woman with:   1. MDS with multilineage dysplasia with an IPSS-R score is 2.5 in July 2017.  She is status post therapy outlined above and currently receiving transfusion support and currently continues to be transfusion dependent.  The natural course of this disease was reviewed today with the patient as well as other treatment options.  It does not appear that her transfusion needs have improved with 5-azacytidine.  I recommended reevaluation by Dr. Florene Glen at [facility] for a second opinion.  I am not quite sure if she is a transplant candidate at this time.  For the time being, she will consider referral back to [facility] and will continue supportive care for the time being.  2.  Anemia: We will continue to monitor her closely and transfuse as needed.  She will require 2 units of packed red cells today and repeat evaluation in 2 weeks.   3. Hypertension: No issues reported with her blood pressure at this time.   4.  Aortic stenosis: No evidence of heart failure related to that.  She continues to follow with cardiology.  5.  Poor appetite: Improved at this time and her weight is increased.  6. Follow-up: October 3 for follow-up.  15  minutes was spent with the patient face-to-face today.  More than 50% of time was dedicated to discussing the natural course of this disease, treatment options and managing her future care.   Zola Button, MD 9/23/201910:21 AM

## 2018-04-07 ENCOUNTER — Telehealth: Payer: Self-pay | Admitting: *Deleted

## 2018-04-07 LAB — TYPE AND SCREEN
ABO/RH(D): A NEG
Antibody Screen: NEGATIVE
Unit division: 0
Unit division: 0

## 2018-04-07 LAB — BPAM RBC
Blood Product Expiration Date: 201910072359
Blood Product Expiration Date: 201910192359
ISSUE DATE / TIME: 201909231209
ISSUE DATE / TIME: 201909231209
Unit Type and Rh: 600
Unit Type and Rh: 600

## 2018-04-07 NOTE — Telephone Encounter (Signed)
Spoke with pt, there was a form sent to dr berry from [employer], attending physician statement for disability claim. The patient reports that she was taken out of work in 03/2017 by dr Alen Blew her cancer doctor. She was given the number to [facility] and is going to call and have this form sent to dr Alen Blew instead. Dr berry does not need to fill out this form. Form returned to medical records.

## 2018-04-09 DIAGNOSIS — E119 Type 2 diabetes mellitus without complications: Secondary | ICD-10-CM | POA: Diagnosis not present

## 2018-04-09 DIAGNOSIS — E7800 Pure hypercholesterolemia, unspecified: Secondary | ICD-10-CM

## 2018-04-09 DIAGNOSIS — I10 Essential (primary) hypertension: Secondary | ICD-10-CM

## 2018-04-14 ENCOUNTER — Other Ambulatory Visit: Payer: Self-pay | Admitting: Nurse Practitioner

## 2018-04-16 ENCOUNTER — Other Ambulatory Visit: Payer: Self-pay | Admitting: *Deleted

## 2018-04-16 ENCOUNTER — Inpatient Hospital Stay: Payer: BC Managed Care – PPO | Attending: Oncology

## 2018-04-16 ENCOUNTER — Inpatient Hospital Stay: Payer: BC Managed Care – PPO

## 2018-04-16 ENCOUNTER — Inpatient Hospital Stay (HOSPITAL_BASED_OUTPATIENT_CLINIC_OR_DEPARTMENT_OTHER): Payer: BC Managed Care – PPO | Admitting: Oncology

## 2018-04-16 ENCOUNTER — Telehealth: Payer: Self-pay

## 2018-04-16 VITALS — BP 141/49 | HR 82 | Temp 98.5°F | Resp 18 | Ht 64.0 in | Wt 214.7 lb

## 2018-04-16 DIAGNOSIS — D631 Anemia in chronic kidney disease: Secondary | ICD-10-CM

## 2018-04-16 DIAGNOSIS — D469 Myelodysplastic syndrome, unspecified: Secondary | ICD-10-CM

## 2018-04-16 DIAGNOSIS — D509 Iron deficiency anemia, unspecified: Secondary | ICD-10-CM

## 2018-04-16 DIAGNOSIS — Z85038 Personal history of other malignant neoplasm of large intestine: Secondary | ICD-10-CM | POA: Diagnosis not present

## 2018-04-16 DIAGNOSIS — D649 Anemia, unspecified: Secondary | ICD-10-CM | POA: Diagnosis not present

## 2018-04-16 DIAGNOSIS — N189 Chronic kidney disease, unspecified: Secondary | ICD-10-CM

## 2018-04-16 DIAGNOSIS — R630 Anorexia: Secondary | ICD-10-CM | POA: Insufficient documentation

## 2018-04-16 DIAGNOSIS — I350 Nonrheumatic aortic (valve) stenosis: Secondary | ICD-10-CM

## 2018-04-16 DIAGNOSIS — I10 Essential (primary) hypertension: Secondary | ICD-10-CM | POA: Diagnosis not present

## 2018-04-16 LAB — CBC WITH DIFFERENTIAL (CANCER CENTER ONLY)
Basophils Absolute: 0 10*3/uL (ref 0.0–0.1)
Basophils Relative: 0 %
Eosinophils Absolute: 0 10*3/uL (ref 0.0–0.5)
Eosinophils Relative: 0 %
HCT: 17 % — ABNORMAL LOW (ref 34.8–46.6)
Hemoglobin: 5.7 g/dL — CL (ref 11.6–15.9)
Lymphocytes Relative: 15 %
Lymphs Abs: 0.4 10*3/uL — ABNORMAL LOW (ref 0.9–3.3)
MCH: 31.1 pg (ref 25.1–34.0)
MCHC: 33.5 g/dL (ref 31.5–36.0)
MCV: 92.9 fL (ref 79.5–101.0)
Monocytes Absolute: 0.7 10*3/uL (ref 0.1–0.9)
Monocytes Relative: 25 %
Neutro Abs: 1.6 10*3/uL (ref 1.5–6.5)
Neutrophils Relative %: 60 %
Platelet Count: 147 10*3/uL (ref 145–400)
RBC: 1.83 MIL/uL — ABNORMAL LOW (ref 3.70–5.45)
RDW: 18 % — ABNORMAL HIGH (ref 11.2–14.5)
WBC Count: 2.8 10*3/uL — ABNORMAL LOW (ref 3.9–10.3)

## 2018-04-16 LAB — CMP (CANCER CENTER ONLY)
ALT: 27 U/L (ref 0–44)
AST: 33 U/L (ref 15–41)
Albumin: 3.5 g/dL (ref 3.5–5.0)
Alkaline Phosphatase: 107 U/L (ref 38–126)
Anion gap: 11 (ref 5–15)
BUN: 28 mg/dL — ABNORMAL HIGH (ref 8–23)
CO2: 21 mmol/L — ABNORMAL LOW (ref 22–32)
Calcium: 9.5 mg/dL (ref 8.9–10.3)
Chloride: 104 mmol/L (ref 98–111)
Creatinine: 0.94 mg/dL (ref 0.44–1.00)
GFR, Est AFR Am: >60 mL/min (ref >60)
GFR, Estimated: >60 mL/min (ref >60)
Glucose, Bld: 213 mg/dL — ABNORMAL HIGH (ref 70–99)
Potassium: 4.5 mmol/L (ref 3.5–5.1)
Sodium: 136 mmol/L (ref 135–145)
Total Bilirubin: 1.5 mg/dL — ABNORMAL HIGH (ref 0.3–1.2)
Total Protein: 6.8 g/dL (ref 6.5–8.1)

## 2018-04-16 LAB — SAMPLE TO BLOOD BANK

## 2018-04-16 LAB — PREPARE RBC (CROSSMATCH)

## 2018-04-16 MED ORDER — DIPHENHYDRAMINE HCL 25 MG PO CAPS
25.0000 mg | ORAL_CAPSULE | Freq: Once | ORAL | Status: AC
  Administered 2018-04-16: 25 mg via ORAL

## 2018-04-16 MED ORDER — ACETAMINOPHEN 325 MG PO TABS
ORAL_TABLET | ORAL | Status: AC
  Filled 2018-04-16: qty 2

## 2018-04-16 MED ORDER — SODIUM CHLORIDE 0.9% IV SOLUTION
250.0000 mL | Freq: Once | INTRAVENOUS | Status: AC
  Administered 2018-04-16: 250 mL via INTRAVENOUS
  Filled 2018-04-16: qty 250

## 2018-04-16 MED ORDER — FUROSEMIDE 10 MG/ML IJ SOLN
20.0000 mg | Freq: Once | INTRAMUSCULAR | Status: AC
  Administered 2018-04-16: 20 mg via INTRAVENOUS

## 2018-04-16 MED ORDER — FUROSEMIDE 10 MG/ML IJ SOLN
INTRAMUSCULAR | Status: AC
  Filled 2018-04-16: qty 2

## 2018-04-16 MED ORDER — SODIUM CHLORIDE 0.9% FLUSH
10.0000 mL | INTRAVENOUS | Status: DC | PRN
  Filled 2018-04-16: qty 10

## 2018-04-16 MED ORDER — ACETAMINOPHEN 325 MG PO TABS
650.0000 mg | ORAL_TABLET | Freq: Once | ORAL | Status: AC
  Administered 2018-04-16: 650 mg via ORAL

## 2018-04-16 MED ORDER — HEPARIN SOD (PORK) LOCK FLUSH 100 UNIT/ML IV SOLN
500.0000 [IU] | Freq: Every day | INTRAVENOUS | Status: DC | PRN
  Filled 2018-04-16: qty 5

## 2018-04-16 MED ORDER — DIPHENHYDRAMINE HCL 25 MG PO CAPS
ORAL_CAPSULE | ORAL | Status: AC
  Filled 2018-04-16: qty 1

## 2018-04-16 NOTE — Patient Instructions (Signed)

## 2018-04-16 NOTE — Progress Notes (Signed)
Hematology and Oncology Follow Up Visit  Patricia Mata [id number] 1955/12/03 62 y.o. 04/16/2018 9:38 AM   Principle Diagnosis: 62 year old woman with:   1. Stage III colon cancer diagnosed in 2008.  She presented with T3N1 is without any evidence of recurrence.  2.  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  Diagnosed in July 2017.    Prior Therapy: She is S/P hemicolectomy done in 11/2006. 1/13 lymph nodes are positive.  This was followed by 12 cycles of FOLFOX completed in 05/2007  Aranesp 300 mcg every 2 to 3 weeks therapy was ineffective to eliminate transfusion needs.  5-azacytidine at 75 mg/m square subcutaneously started in October 2018.  She is status post 7 cycles of therapy completed in April 2019.   Current therapy: Supportive transfusions.     Interim History:  Patricia Mata is today for evaluation.  Since her last visit, she reports no major changes or complaints.  She received 2 units of packed red cell transfusion last 2 weeks with improvement in her symptoms.  She denies any chest pain, shortness of breath or dyspnea on exertion.  She denies any palpitation.  Performance status remains adequate and ambulating without any major difficulties.  She does report increased fatigue after walking extended period of time.  She denied headaches blurry vision, syncope or seizures.  She denies any duration mental status or confusion.  She does not report any fevers, chills sweats. She does not report any chest pain, palpitation orthopnea or leg edema. She does not report any cough, wheezing or hemoptysis. She does not report any nausea, vomiting or abdominal pain.  She denies any changes in bowel habits.  She does not report any frequency urgency or hesitancy. She does not report any bone pain or pathological fractures.  She denies any bleeding or clotting tendency.  She denies any skin rash or pruritus.  She denies any anxiety or depression.  Remaining review of systems is  negative.   Medications: I have reviewed the patient's current medications.   Allergies:  Allergies  Allergen Reactions  . Ancef [Cefazolin] Itching    Severe itching- after procedure, ancef was the antibiotic.-04/02/17  . Sulfa Antibiotics Rash and Other (See Comments)    blisters     Physical Exam:  Blood pressure (!) 141/49, pulse 82, temperature 98.5 F (36.9 C), temperature source Oral, resp. rate 18, height 5\' 4"  (1.626 m), weight 214 lb 11.2 oz (97.4 kg), SpO2 99 %.     ECOG: 1   General appearance: Alert, awake without any distress. Head: Atraumatic without abnormalities Oropharynx: Without any thrush or ulcers. Eyes: No scleral icterus. Lymph nodes: No lymphadenopathy noted in the cervical, supraclavicular, or axillary nodes Heart:regular rate and rhythm, without any murmurs or gallops.   Lung: Clear to auscultation without any rhonchi, wheezes or dullness to percussion. Abdomin: Soft, nontender without any shifting dullness or ascites. Musculoskeletal: No clubbing or cyanosis. Neurological: No motor or sensory deficits. Skin: No rashes or lesions. Psychiatric: Mood and affect appeared normal.       Lab Results: Lab Results  Component Value Date   WBC 3.3 (L) 04/06/2018   HGB 5.3 (LL) 04/06/2018   HCT 15.8 (L) 04/06/2018   MCV 94.0 04/06/2018   PLT 144 (L) 04/06/2018     Chemistry      Component Value Date/Time   NA 135 04/06/2018 1008   NA 132 (L) 06/10/2017 1028   NA 134 (L) 05/26/2017 0949   K 4.4 04/06/2018  1008   K 4.0 05/26/2017 0949   CL 104 04/06/2018 1008   CL 96 (L) 03/31/2012 1513   CO2 21 (L) 04/06/2018 1008   CO2 24 05/26/2017 0949   BUN 31 (H) 04/06/2018 1008   BUN 23 06/10/2017 1028   BUN 29.2 (H) 05/26/2017 0949   CREATININE 1.04 (H) 04/06/2018 1008   CREATININE 1.4 (H) 05/26/2017 0949      Component Value Date/Time   CALCIUM 9.8 04/06/2018 1008   CALCIUM 8.9 05/26/2017 0949   ALKPHOS 84 04/06/2018 1008   ALKPHOS 157  (H) 05/26/2017 0949   AST 22 04/06/2018 1008   AST 29 05/26/2017 0949   ALT 18 04/06/2018 1008   ALT 23 05/26/2017 0949   BILITOT 0.9 04/06/2018 1008   BILITOT 2.19 (H) 05/26/2017 0949     Current Outpatient Medications on File Prior to Visit  Medication Sig Dispense Refill  . atorvastatin (LIPITOR) 40 MG tablet Take 40 mg by mouth daily.    . Azilsartan-Chlorthalidone (EDARBYCLOR) 40-12.5 MG TABS Take 1 tablet by mouth daily.     . colchicine 0.6 MG tablet Take 0.6 mg by mouth 2 (two) times daily as needed (gout flare).   2  . furosemide (LASIX) 20 MG tablet TAKE 1 TABLET BY MOUTH TWICE A DAY 60 tablet 0  . gabapentin (NEURONTIN) 100 MG capsule Take 2 capsules (200 mg total) by mouth 3 (three) times daily. 120 capsule 1  . hydrALAZINE (APRESOLINE) 25 MG tablet Take 2 tablets (50 mg total) by mouth 3 (three) times daily. 540 tablet 3  . insulin glargine (LANTUS) 100 unit/mL SOPN Inject 0.14 mLs (14 Units total) into the skin daily after breakfast. (Patient taking differently: Inject 10 Units into the skin daily after breakfast. ) 15 mL 11  . Insulin Pen Needle 29G X 5MM MISC Use as directed 200 each 0  . latanoprost (XALATAN) 0.005 % ophthalmic solution Place 1 drop into both eyes at bedtime.     . lidocaine-prilocaine (EMLA) cream Apply a quarter size of cream to port 1-2 hours prior to access. Cover with saran wrap. 30 g 1  . megestrol (MEGACE) 400 MG/10ML suspension Take 10 mLs (400 mg total) by mouth 2 (two) times daily. 240 mL 1  . Melatonin 10 MG CAPS Take 10 mg by mouth at bedtime as needed (sleep).    . metoprolol (LOPRESSOR) 50 MG tablet Take 50 mg by mouth 2 (two) times daily.     Marland Kitchen oxyCODONE-acetaminophen (PERCOCET/ROXICET) 5-325 MG tablet Take 1-2 tablets by mouth every 4 (four) hours as needed for severe pain. 30 tablet 0  . predniSONE (DELTASONE) 5 MG tablet 6 tab x 1 day, 5 tab x 1 day, 4 tab x 1 day, 3 tab x 1 day, 2 tab x 1 day, 1 tab x 1 day, stop 21 tablet 0  .  prochlorperazine (COMPAZINE) 10 MG tablet Take 1 tablet (10 mg total) by mouth every 6 (six) hours as needed for nausea or vomiting. 30 tablet 1   Current Facility-Administered Medications on File Prior to Visit  Medication Dose Route Frequency Provider Last Rate Last Dose  . sodium chloride flush (NS) 0.9 % injection 10 mL  10 mL Intravenous PRN [name], MD         Impression and Plan:  62 year old woman with:   1. 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  She has progressed on therapy as outlined above and currently receiving supportive care.  Options of therapy were reviewed again which includes trial of Revlimid, decitabine and continue supportive transfusion.  Repeat evaluation at [facility] has been recommended to her in the past.  Consideration for stem cell transplant is also discussed.  This may be prohibitive because of her cardiac status as well as her age.  She would like to defer any other further intervention at this time as her husband plan to have surgery.  Continue supportive transfusion at this time.  2.  Anemia: She has been receiving supportive transfusion.  Her hemoglobin is 5.7 and will require 2 units of packed red cell transfusion.   3. Hypertension: Blood pressure has been under control as of late.  No issues or exacerbation.   4.  Aortic stenosis: No heart failure detected at this time.  Appears to be fairly compensated.  5.  Poor appetite: Improved and eating well at this time.  6.  Prognosis: Guarded at this time given her lack of response to aggressive therapy and her chronic transfusion dependence.  7. Follow-up: In the next 2 weeks for evaluation for possible repeat transfusion.  15  minutes was spent with the patient face-to-face today.  More than 50% of time was dedicated to reviewing treatment options, disease status and complications related to her diagnoses.   Zola Button, MD 10/3/20199:38 AM

## 2018-04-16 NOTE — Progress Notes (Signed)
Disability successfully faxed to [employer] at [phone]. Mailed copy to patient address on file.

## 2018-04-16 NOTE — Telephone Encounter (Signed)
Printed avs and calender of upcoming appointment. Per 10/3 los 

## 2018-04-18 LAB — TYPE AND SCREEN
ABO/RH(D): A NEG
Antibody Screen: NEGATIVE
Unit division: 0
Unit division: 0

## 2018-04-18 LAB — BPAM RBC
Blood Product Expiration Date: 201911012359
Blood Product Expiration Date: 201911012359
ISSUE DATE / TIME: 201910031148
ISSUE DATE / TIME: 201910031148
Unit Type and Rh: 600
Unit Type and Rh: 600

## 2018-04-20 ENCOUNTER — Other Ambulatory Visit: Payer: Self-pay | Admitting: Nurse Practitioner

## 2018-04-21 ENCOUNTER — Encounter (HOSPITAL_COMMUNITY): Payer: BC Managed Care – PPO

## 2018-04-23 ENCOUNTER — Other Ambulatory Visit: Payer: Self-pay

## 2018-04-23 DIAGNOSIS — E7800 Pure hypercholesterolemia, unspecified: Secondary | ICD-10-CM

## 2018-04-23 MED ORDER — ATORVASTATIN CALCIUM 40 MG PO TABS: 40.0000 mg | ORAL_TABLET | Freq: Every day | ORAL | 0 refills | Status: DC

## 2018-04-23 NOTE — Progress Notes (Signed)
Disability paperwork completed and patient informed it is ready for pick up. Forms taken to [name] at front desk for pick up.

## 2018-04-24 ENCOUNTER — Telehealth: Payer: Self-pay | Admitting: *Deleted

## 2018-04-24 ENCOUNTER — Inpatient Hospital Stay: Payer: BC Managed Care – PPO

## 2018-04-24 ENCOUNTER — Other Ambulatory Visit: Payer: Self-pay | Admitting: *Deleted

## 2018-04-24 DIAGNOSIS — D469 Myelodysplastic syndrome, unspecified: Secondary | ICD-10-CM

## 2018-04-24 LAB — CBC WITH DIFFERENTIAL (CANCER CENTER ONLY)
Abs Immature Granulocytes: 0.03 10*3/uL (ref 0.00–0.07)
Basophils Absolute: 0 10*3/uL (ref 0.0–0.1)
Basophils Relative: 0 %
Eosinophils Absolute: 0 10*3/uL (ref 0.0–0.5)
Eosinophils Relative: 0 %
HCT: 19.4 % — ABNORMAL LOW (ref 36.0–46.0)
Hemoglobin: 6.4 g/dL — CL (ref 12.0–15.0)
Immature Granulocytes: 1 %
Lymphocytes Relative: 10 %
Lymphs Abs: 0.4 10*3/uL — ABNORMAL LOW (ref 0.7–4.0)
MCH: 30.3 pg (ref 26.0–34.0)
MCHC: 33 g/dL (ref 30.0–36.0)
MCV: 91.9 fL (ref 80.0–100.0)
Monocytes Absolute: 0.8 10*3/uL (ref 0.1–1.0)
Monocytes Relative: 20 %
Neutro Abs: 2.8 10*3/uL (ref 1.7–7.7)
Neutrophils Relative %: 69 %
Platelet Count: 152 10*3/uL (ref 150–400)
RBC: 2.11 MIL/uL — ABNORMAL LOW (ref 3.87–5.11)
RDW: 17.4 % — ABNORMAL HIGH (ref 11.5–15.5)
WBC Count: 4.1 10*3/uL (ref 4.0–10.5)
nRBC: 0 % (ref 0.0–0.2)

## 2018-04-24 LAB — CMP (CANCER CENTER ONLY)
ALT: 27 U/L (ref 0–44)
AST: 35 U/L (ref 15–41)
Albumin: 3.4 g/dL — ABNORMAL LOW (ref 3.5–5.0)
Alkaline Phosphatase: 126 U/L (ref 38–126)
Anion gap: 13 (ref 5–15)
BUN: 20 mg/dL (ref 8–23)
CO2: 19 mmol/L — ABNORMAL LOW (ref 22–32)
Calcium: 9.4 mg/dL (ref 8.9–10.3)
Chloride: 103 mmol/L (ref 98–111)
Creatinine: 1.05 mg/dL — ABNORMAL HIGH (ref 0.44–1.00)
GFR, Est AFR Am: >60 mL/min (ref >60)
GFR, Estimated: 55 mL/min — ABNORMAL LOW (ref >60)
Glucose, Bld: 291 mg/dL — ABNORMAL HIGH (ref 70–99)
Potassium: 4.7 mmol/L (ref 3.5–5.1)
Sodium: 135 mmol/L (ref 135–145)
Total Bilirubin: 2.3 mg/dL — ABNORMAL HIGH (ref 0.3–1.2)
Total Protein: 6.7 g/dL (ref 6.5–8.1)

## 2018-04-24 LAB — SAMPLE TO BLOOD BANK

## 2018-04-24 LAB — PREPARE RBC (CROSSMATCH)

## 2018-04-24 NOTE — Telephone Encounter (Signed)
Patient calling. States she is weak and has absolutely no energy. Per Sandi Mealy okay to have patient come in for cbc and type and hold. Los to schedulers and Patient notified.

## 2018-04-24 NOTE — Telephone Encounter (Signed)
No note

## 2018-04-25 ENCOUNTER — Inpatient Hospital Stay: Payer: BC Managed Care – PPO

## 2018-04-25 VITALS — BP 188/66 | HR 77 | Temp 98.8°F | Resp 16

## 2018-04-25 DIAGNOSIS — D469 Myelodysplastic syndrome, unspecified: Secondary | ICD-10-CM

## 2018-04-25 DIAGNOSIS — D631 Anemia in chronic kidney disease: Secondary | ICD-10-CM

## 2018-04-25 DIAGNOSIS — N189 Chronic kidney disease, unspecified: Secondary | ICD-10-CM

## 2018-04-25 MED ORDER — ACETAMINOPHEN 325 MG PO TABS
650.0000 mg | ORAL_TABLET | Freq: Once | ORAL | Status: AC
  Administered 2018-04-25: 650 mg via ORAL

## 2018-04-25 MED ORDER — FUROSEMIDE 10 MG/ML IJ SOLN
INTRAMUSCULAR | Status: AC
  Filled 2018-04-25: qty 2

## 2018-04-25 MED ORDER — FUROSEMIDE 10 MG/ML IJ SOLN
20.0000 mg | Freq: Once | INTRAMUSCULAR | Status: AC
  Administered 2018-04-25: 20 mg via INTRAVENOUS

## 2018-04-25 MED ORDER — DIPHENHYDRAMINE HCL 25 MG PO CAPS
ORAL_CAPSULE | ORAL | Status: AC
  Filled 2018-04-25: qty 1

## 2018-04-25 MED ORDER — SODIUM CHLORIDE 0.9% IV SOLUTION
250.0000 mL | Freq: Once | INTRAVENOUS | Status: AC
  Administered 2018-04-25: 250 mL via INTRAVENOUS
  Filled 2018-04-25: qty 250

## 2018-04-25 MED ORDER — ACETAMINOPHEN 325 MG PO TABS
ORAL_TABLET | ORAL | Status: AC
  Filled 2018-04-25: qty 2

## 2018-04-25 MED ORDER — DIPHENHYDRAMINE HCL 25 MG PO CAPS
25.0000 mg | ORAL_CAPSULE | Freq: Once | ORAL | Status: AC
  Administered 2018-04-25: 25 mg via ORAL

## 2018-04-25 NOTE — Patient Instructions (Signed)

## 2018-04-27 ENCOUNTER — Telehealth: Payer: Self-pay

## 2018-04-27 LAB — TYPE AND SCREEN
ABO/RH(D): A NEG
Antibody Screen: NEGATIVE
Unit division: 0
Unit division: 0

## 2018-04-27 LAB — BPAM RBC
Blood Product Expiration Date: 201911012359
Blood Product Expiration Date: 201911022359
ISSUE DATE / TIME: 201910120828
ISSUE DATE / TIME: 201910120830
Unit Type and Rh: 600
Unit Type and Rh: 600

## 2018-04-27 NOTE — Telephone Encounter (Signed)
Received VM from pt requesting to be notified if she still needs to come in this Wednesday since she had 2U blood on Saturday. Called and informed pt that she would be notified of Dr. Hazeline Junker decision upon his return tomorrow. Pt verbalized understanding and thanks for the update.

## 2018-04-28 ENCOUNTER — Telehealth: Payer: Self-pay

## 2018-04-28 NOTE — Telephone Encounter (Signed)
Received a call from the patient wanting to know if she needs to keep her appointment for tomorrow 10/16 for labs and possible transfusion since she received 2 units of PRBC's on this past Saturday. This RN explained that she will need to keep the appointments to follow up on her Hgb to see if she will need the transfusion or not. Patient verbalized understanding.

## 2018-04-28 NOTE — Telephone Encounter (Signed)
Patient notified pt stated she did have a blood transfusion on Friday she was given 2 pints. YRL,RMA

## 2018-04-29 ENCOUNTER — Inpatient Hospital Stay: Payer: BC Managed Care – PPO

## 2018-04-29 ENCOUNTER — Other Ambulatory Visit: Payer: Self-pay

## 2018-04-29 ENCOUNTER — Telehealth: Payer: Self-pay

## 2018-04-29 DIAGNOSIS — D631 Anemia in chronic kidney disease: Secondary | ICD-10-CM

## 2018-04-29 DIAGNOSIS — N189 Chronic kidney disease, unspecified: Secondary | ICD-10-CM

## 2018-04-29 DIAGNOSIS — D469 Myelodysplastic syndrome, unspecified: Secondary | ICD-10-CM

## 2018-04-29 LAB — CBC WITH DIFFERENTIAL (CANCER CENTER ONLY)
Abs Immature Granulocytes: 0.01 10*3/uL (ref 0.00–0.07)
Basophils Absolute: 0 10*3/uL (ref 0.0–0.1)
Basophils Relative: 0 %
Eosinophils Absolute: 0 10*3/uL (ref 0.0–0.5)
Eosinophils Relative: 1 %
HCT: 25.8 % — ABNORMAL LOW (ref 36.0–46.0)
Hemoglobin: 8.7 g/dL — ABNORMAL LOW (ref 12.0–15.0)
Immature Granulocytes: 0 %
Lymphocytes Relative: 17 %
Lymphs Abs: 0.6 10*3/uL — ABNORMAL LOW (ref 0.7–4.0)
MCH: 30.6 pg (ref 26.0–34.0)
MCHC: 33.7 g/dL (ref 30.0–36.0)
MCV: 90.8 fL (ref 80.0–100.0)
Monocytes Absolute: 0.7 10*3/uL (ref 0.1–1.0)
Monocytes Relative: 20 %
Neutro Abs: 2.1 10*3/uL (ref 1.7–7.7)
Neutrophils Relative %: 62 %
Platelet Count: 172 10*3/uL (ref 150–400)
RBC: 2.84 MIL/uL — ABNORMAL LOW (ref 3.87–5.11)
RDW: 16.3 % — ABNORMAL HIGH (ref 11.5–15.5)
WBC Count: 3.4 10*3/uL — ABNORMAL LOW (ref 4.0–10.5)
nRBC: 0 % (ref 0.0–0.2)

## 2018-04-29 LAB — SAMPLE TO BLOOD BANK

## 2018-04-29 NOTE — Telephone Encounter (Signed)
Per Dr. Alen Blew no blood transfusion required today due to hemoglobin of 8.7

## 2018-04-30 NOTE — Telephone Encounter (Signed)
Okay great!

## 2018-05-01 ENCOUNTER — Ambulatory Visit (HOSPITAL_COMMUNITY): Payer: BC Managed Care – PPO | Attending: Cardiology

## 2018-05-01 ENCOUNTER — Other Ambulatory Visit: Payer: Self-pay

## 2018-05-01 DIAGNOSIS — I350 Nonrheumatic aortic (valve) stenosis: Secondary | ICD-10-CM | POA: Diagnosis present

## 2018-05-02 ENCOUNTER — Other Ambulatory Visit: Payer: Self-pay | Admitting: Nurse Practitioner

## 2018-05-04 ENCOUNTER — Other Ambulatory Visit: Payer: Self-pay | Admitting: *Deleted

## 2018-05-04 DIAGNOSIS — I350 Nonrheumatic aortic (valve) stenosis: Secondary | ICD-10-CM

## 2018-05-11 ENCOUNTER — Inpatient Hospital Stay: Payer: BC Managed Care – PPO | Admitting: Oncology

## 2018-05-11 ENCOUNTER — Emergency Department (HOSPITAL_COMMUNITY): Payer: BC Managed Care – PPO

## 2018-05-11 ENCOUNTER — Inpatient Hospital Stay (HOSPITAL_COMMUNITY)
Admission: EM | Admit: 2018-05-11 | Discharge: 2018-05-26 | DRG: 286 | Disposition: A | Discharge status: Skilled Nursing Facility | Payer: BC Managed Care – PPO | Attending: Internal Medicine | Admitting: Internal Medicine

## 2018-05-11 ENCOUNTER — Other Ambulatory Visit: Payer: Self-pay

## 2018-05-11 ENCOUNTER — Encounter (HOSPITAL_COMMUNITY): Payer: Self-pay | Admitting: Emergency Medicine

## 2018-05-11 ENCOUNTER — Inpatient Hospital Stay: Payer: BC Managed Care – PPO

## 2018-05-11 DIAGNOSIS — R570 Cardiogenic shock: Secondary | ICD-10-CM | POA: Diagnosis not present

## 2018-05-11 DIAGNOSIS — Z882 Allergy status to sulfonamides status: Secondary | ICD-10-CM

## 2018-05-11 DIAGNOSIS — D469 Myelodysplastic syndrome, unspecified: Secondary | ICD-10-CM | POA: Diagnosis present

## 2018-05-11 DIAGNOSIS — Z9049 Acquired absence of other specified parts of digestive tract: Secondary | ICD-10-CM

## 2018-05-11 DIAGNOSIS — I361 Nonrheumatic tricuspid (valve) insufficiency: Secondary | ICD-10-CM | POA: Diagnosis not present

## 2018-05-11 DIAGNOSIS — I4891 Unspecified atrial fibrillation: Secondary | ICD-10-CM

## 2018-05-11 DIAGNOSIS — I5033 Acute on chronic diastolic (congestive) heart failure: Secondary | ICD-10-CM | POA: Diagnosis present

## 2018-05-11 DIAGNOSIS — I083 Combined rheumatic disorders of mitral, aortic and tricuspid valves: Secondary | ICD-10-CM | POA: Diagnosis present

## 2018-05-11 DIAGNOSIS — N170 Acute kidney failure with tubular necrosis: Secondary | ICD-10-CM | POA: Diagnosis present

## 2018-05-11 DIAGNOSIS — Z888 Allergy status to other drugs, medicaments and biological substances status: Secondary | ICD-10-CM

## 2018-05-11 DIAGNOSIS — I248 Other forms of acute ischemic heart disease: Secondary | ICD-10-CM | POA: Diagnosis present

## 2018-05-11 DIAGNOSIS — D509 Iron deficiency anemia, unspecified: Secondary | ICD-10-CM | POA: Diagnosis present

## 2018-05-11 DIAGNOSIS — Z9911 Dependence on respirator [ventilator] status: Secondary | ICD-10-CM | POA: Diagnosis not present

## 2018-05-11 DIAGNOSIS — I2721 Secondary pulmonary arterial hypertension: Secondary | ICD-10-CM | POA: Diagnosis present

## 2018-05-11 DIAGNOSIS — L8990 Pressure ulcer of unspecified site, unspecified stage: Secondary | ICD-10-CM

## 2018-05-11 DIAGNOSIS — Z515 Encounter for palliative care: Secondary | ICD-10-CM

## 2018-05-11 DIAGNOSIS — R9431 Abnormal electrocardiogram [ECG] [EKG]: Secondary | ICD-10-CM | POA: Diagnosis not present

## 2018-05-11 DIAGNOSIS — E1122 Type 2 diabetes mellitus with diabetic chronic kidney disease: Secondary | ICD-10-CM | POA: Diagnosis present

## 2018-05-11 DIAGNOSIS — N179 Acute kidney failure, unspecified: Secondary | ICD-10-CM | POA: Diagnosis not present

## 2018-05-11 DIAGNOSIS — J9600 Acute respiratory failure, unspecified whether with hypoxia or hypercapnia: Secondary | ICD-10-CM

## 2018-05-11 DIAGNOSIS — E872 Acidosis, unspecified: Secondary | ICD-10-CM

## 2018-05-11 DIAGNOSIS — R680 Hypothermia, not associated with low environmental temperature: Secondary | ICD-10-CM | POA: Diagnosis not present

## 2018-05-11 DIAGNOSIS — N186 End stage renal disease: Secondary | ICD-10-CM | POA: Diagnosis present

## 2018-05-11 DIAGNOSIS — Z452 Encounter for adjustment and management of vascular access device: Secondary | ICD-10-CM

## 2018-05-11 DIAGNOSIS — I480 Paroxysmal atrial fibrillation: Secondary | ICD-10-CM | POA: Diagnosis present

## 2018-05-11 DIAGNOSIS — I272 Pulmonary hypertension, unspecified: Secondary | ICD-10-CM | POA: Diagnosis not present

## 2018-05-11 DIAGNOSIS — K7200 Acute and subacute hepatic failure without coma: Secondary | ICD-10-CM | POA: Diagnosis not present

## 2018-05-11 DIAGNOSIS — D649 Anemia, unspecified: Secondary | ICD-10-CM | POA: Diagnosis not present

## 2018-05-11 DIAGNOSIS — E875 Hyperkalemia: Secondary | ICD-10-CM | POA: Diagnosis present

## 2018-05-11 DIAGNOSIS — J9601 Acute respiratory failure with hypoxia: Secondary | ICD-10-CM | POA: Diagnosis present

## 2018-05-11 DIAGNOSIS — J189 Pneumonia, unspecified organism: Secondary | ICD-10-CM | POA: Diagnosis present

## 2018-05-11 DIAGNOSIS — G4733 Obstructive sleep apnea (adult) (pediatric): Secondary | ICD-10-CM | POA: Diagnosis present

## 2018-05-11 DIAGNOSIS — E8770 Fluid overload, unspecified: Secondary | ICD-10-CM | POA: Diagnosis present

## 2018-05-11 DIAGNOSIS — Z8249 Family history of ischemic heart disease and other diseases of the circulatory system: Secondary | ICD-10-CM

## 2018-05-11 DIAGNOSIS — J440 Chronic obstructive pulmonary disease with acute lower respiratory infection: Secondary | ICD-10-CM | POA: Diagnosis present

## 2018-05-11 DIAGNOSIS — W19XXXA Unspecified fall, initial encounter: Secondary | ICD-10-CM | POA: Diagnosis not present

## 2018-05-11 DIAGNOSIS — L988 Other specified disorders of the skin and subcutaneous tissue: Secondary | ICD-10-CM | POA: Diagnosis not present

## 2018-05-11 DIAGNOSIS — I350 Nonrheumatic aortic (valve) stenosis: Secondary | ICD-10-CM

## 2018-05-11 DIAGNOSIS — T68XXXA Hypothermia, initial encounter: Secondary | ICD-10-CM

## 2018-05-11 DIAGNOSIS — Z87891 Personal history of nicotine dependence: Secondary | ICD-10-CM

## 2018-05-11 DIAGNOSIS — K625 Hemorrhage of anus and rectum: Secondary | ICD-10-CM | POA: Diagnosis not present

## 2018-05-11 DIAGNOSIS — D696 Thrombocytopenia, unspecified: Secondary | ICD-10-CM | POA: Diagnosis not present

## 2018-05-11 DIAGNOSIS — R34 Anuria and oliguria: Secondary | ICD-10-CM | POA: Diagnosis present

## 2018-05-11 DIAGNOSIS — J9690 Respiratory failure, unspecified, unspecified whether with hypoxia or hypercapnia: Secondary | ICD-10-CM

## 2018-05-11 DIAGNOSIS — Z794 Long term (current) use of insulin: Secondary | ICD-10-CM

## 2018-05-11 DIAGNOSIS — Z66 Do not resuscitate: Secondary | ICD-10-CM | POA: Diagnosis present

## 2018-05-11 DIAGNOSIS — I132 Hypertensive heart and chronic kidney disease with heart failure and with stage 5 chronic kidney disease, or end stage renal disease: Secondary | ICD-10-CM | POA: Diagnosis present

## 2018-05-11 DIAGNOSIS — Z4659 Encounter for fitting and adjustment of other gastrointestinal appliance and device: Secondary | ICD-10-CM

## 2018-05-11 DIAGNOSIS — I4819 Other persistent atrial fibrillation: Secondary | ICD-10-CM | POA: Diagnosis not present

## 2018-05-11 DIAGNOSIS — Y95 Nosocomial condition: Secondary | ICD-10-CM | POA: Diagnosis present

## 2018-05-11 DIAGNOSIS — D631 Anemia in chronic kidney disease: Secondary | ICD-10-CM | POA: Diagnosis present

## 2018-05-11 DIAGNOSIS — M25572 Pain in left ankle and joints of left foot: Secondary | ICD-10-CM

## 2018-05-11 DIAGNOSIS — Z79899 Other long term (current) drug therapy: Secondary | ICD-10-CM

## 2018-05-11 DIAGNOSIS — R0602 Shortness of breath: Secondary | ICD-10-CM | POA: Diagnosis present

## 2018-05-11 DIAGNOSIS — Z9289 Personal history of other medical treatment: Secondary | ICD-10-CM

## 2018-05-11 DIAGNOSIS — Z79818 Long term (current) use of other agents affecting estrogen receptors and estrogen levels: Secondary | ICD-10-CM

## 2018-05-11 DIAGNOSIS — E669 Obesity, unspecified: Secondary | ICD-10-CM | POA: Diagnosis present

## 2018-05-11 DIAGNOSIS — Z6838 Body mass index (BMI) 38.0-38.9, adult: Secondary | ICD-10-CM

## 2018-05-11 DIAGNOSIS — Z85038 Personal history of other malignant neoplasm of large intestine: Secondary | ICD-10-CM

## 2018-05-11 HISTORY — DX: Malignant (primary) neoplasm, unspecified: C80.1

## 2018-05-11 LAB — GLUCOSE, CAPILLARY
Glucose-Capillary: 104 mg/dL — ABNORMAL HIGH (ref 70–99)
Glucose-Capillary: 106 mg/dL — ABNORMAL HIGH (ref 70–99)
Glucose-Capillary: 116 mg/dL — ABNORMAL HIGH (ref 70–99)

## 2018-05-11 LAB — CBC
HCT: 22.2 % — ABNORMAL LOW (ref 36.0–46.0)
Hemoglobin: 6.8 g/dL — CL (ref 12.0–15.0)
MCH: 30.2 pg (ref 26.0–34.0)
MCHC: 30.6 g/dL (ref 30.0–36.0)
MCV: 98.7 fL (ref 80.0–100.0)
Platelets: 248 10*3/uL (ref 150–400)
RBC: 2.25 MIL/uL — ABNORMAL LOW (ref 3.87–5.11)
RDW: 17.7 % — ABNORMAL HIGH (ref 11.5–15.5)
WBC: 10 10*3/uL (ref 4.0–10.5)
nRBC: 0.4 % — ABNORMAL HIGH (ref 0.0–0.2)

## 2018-05-11 LAB — CBC WITH DIFFERENTIAL/PLATELET
Abs Immature Granulocytes: 0.04 10*3/uL (ref 0.00–0.07)
Basophils Absolute: 0 10*3/uL (ref 0.0–0.1)
Basophils Relative: 0 %
Eosinophils Absolute: 0 10*3/uL (ref 0.0–0.5)
Eosinophils Relative: 0 %
HCT: 19.9 % — ABNORMAL LOW (ref 36.0–46.0)
Hemoglobin: 6.3 g/dL — CL (ref 12.0–15.0)
Immature Granulocytes: 1 %
Lymphocytes Relative: 11 %
Lymphs Abs: 0.6 10*3/uL — ABNORMAL LOW (ref 0.7–4.0)
MCH: 30.7 pg (ref 26.0–34.0)
MCHC: 31.7 g/dL (ref 30.0–36.0)
MCV: 97.1 fL (ref 80.0–100.0)
Monocytes Absolute: 1.3 10*3/uL — ABNORMAL HIGH (ref 0.1–1.0)
Monocytes Relative: 23 %
Neutro Abs: 3.7 10*3/uL (ref 1.7–7.7)
Neutrophils Relative %: 65 %
Platelets: 257 10*3/uL (ref 150–400)
RBC: 2.05 MIL/uL — ABNORMAL LOW (ref 3.87–5.11)
RDW: 17.5 % — ABNORMAL HIGH (ref 11.5–15.5)
WBC: 5.7 10*3/uL (ref 4.0–10.5)
nRBC: 0 % (ref 0.0–0.2)

## 2018-05-11 LAB — PREPARE RBC (CROSSMATCH)

## 2018-05-11 LAB — CBG MONITORING, ED: Glucose-Capillary: 100 mg/dL — ABNORMAL HIGH (ref 70–99)

## 2018-05-11 LAB — BASIC METABOLIC PANEL
Anion gap: 25 — ABNORMAL HIGH (ref 5–15)
BUN: 48 mg/dL — ABNORMAL HIGH (ref 8–23)
CO2: 8 mmol/L — ABNORMAL LOW (ref 22–32)
Calcium: 8.8 mg/dL — ABNORMAL LOW (ref 8.9–10.3)
Chloride: 97 mmol/L — ABNORMAL LOW (ref 98–111)
Creatinine, Ser: 3.37 mg/dL — ABNORMAL HIGH (ref 0.44–1.00)
GFR calc Af Amer: 16 mL/min — ABNORMAL LOW (ref >60)
GFR calc non Af Amer: 14 mL/min — ABNORMAL LOW (ref >60)
Glucose, Bld: 164 mg/dL — ABNORMAL HIGH (ref 70–99)
Potassium: 4.4 mmol/L (ref 3.5–5.1)
Sodium: 130 mmol/L — ABNORMAL LOW (ref 135–145)

## 2018-05-11 LAB — I-STAT TROPONIN, ED: Troponin i, poc: 0.03 ng/mL (ref 0.00–0.08)

## 2018-05-11 LAB — MRSA PCR SCREENING: MRSA by PCR: NEGATIVE

## 2018-05-11 LAB — TROPONIN I
Troponin I: 0.12 ng/mL (ref <0.03)
Troponin I: 0.28 ng/mL (ref <0.03)
Troponin I: 0.27 ng/mL (ref <0.03)

## 2018-05-11 LAB — BRAIN NATRIURETIC PEPTIDE: B Natriuretic Peptide: 2681.1 pg/mL — ABNORMAL HIGH (ref 0.0–100.0)

## 2018-05-11 LAB — PROTIME-INR
INR: 1.78
Prothrombin Time: 20.4 seconds — ABNORMAL HIGH (ref 11.4–15.2)

## 2018-05-11 LAB — MAGNESIUM: Magnesium: 1.2 mg/dL — ABNORMAL LOW (ref 1.7–2.4)

## 2018-05-11 LAB — HEMOGLOBIN AND HEMATOCRIT, BLOOD
HCT: 24.6 % — ABNORMAL LOW (ref 36.0–46.0)
Hemoglobin: 7.6 g/dL — ABNORMAL LOW (ref 12.0–15.0)

## 2018-05-11 MED ORDER — HEPARIN BOLUS VIA INFUSION
2000.0000 [IU] | Freq: Once | INTRAVENOUS | Status: AC
  Administered 2018-05-11: 2000 [IU] via INTRAVENOUS
  Filled 2018-05-11: qty 2000

## 2018-05-11 MED ORDER — DILTIAZEM HCL-DEXTROSE 100-5 MG/100ML-% IV SOLN (PREMIX)
5.0000 mg/h | INTRAVENOUS | Status: DC
  Administered 2018-05-11: 5 mg/h via INTRAVENOUS
  Filled 2018-05-11: qty 100

## 2018-05-11 MED ORDER — HEPARIN (PORCINE) IN NACL 100-0.45 UNIT/ML-% IJ SOLN
1250.0000 [IU]/h | INTRAMUSCULAR | Status: DC
  Administered 2018-05-11: 1250 [IU]/h via INTRAVENOUS
  Filled 2018-05-11: qty 250

## 2018-05-11 MED ORDER — DILTIAZEM LOAD VIA INFUSION
15.0000 mg | Freq: Once | INTRAVENOUS | Status: AC
  Administered 2018-05-11: 15 mg via INTRAVENOUS
  Filled 2018-05-11: qty 15

## 2018-05-11 MED ORDER — DILTIAZEM LOAD VIA INFUSION
10.0000 mg | Freq: Once | INTRAVENOUS | Status: DC
  Filled 2018-05-11: qty 10

## 2018-05-11 MED ORDER — SODIUM CHLORIDE 0.9% IV SOLUTION: Freq: Once | INTRAVENOUS | Status: DC

## 2018-05-11 MED ORDER — LATANOPROST 0.005 % OP SOLN
1.0000 [drp] | Freq: Every day | OPHTHALMIC | Status: DC
  Administered 2018-05-12 – 2018-05-26 (×13): 1 [drp] via OPHTHALMIC
  Filled 2018-05-11 (×2): qty 2.5

## 2018-05-11 MED ORDER — DILTIAZEM HCL-DEXTROSE 100-5 MG/100ML-% IV SOLN (PREMIX)
10.0000 mg/h | INTRAVENOUS | Status: DC
  Administered 2018-05-11: 12.5 mg/h via INTRAVENOUS
  Filled 2018-05-11: qty 100

## 2018-05-11 MED ORDER — ZOLPIDEM TARTRATE 5 MG PO TABS
5.0000 mg | ORAL_TABLET | Freq: Every evening | ORAL | Status: DC | PRN
  Administered 2018-05-11: 5 mg via ORAL
  Filled 2018-05-11: qty 1

## 2018-05-11 MED ORDER — SODIUM CHLORIDE 0.9 % IV BOLUS
500.0000 mL | Freq: Once | INTRAVENOUS | Status: AC
  Administered 2018-05-11: 500 mL via INTRAVENOUS

## 2018-05-11 MED ORDER — FUROSEMIDE 10 MG/ML IJ SOLN
20.0000 mg | Freq: Once | INTRAMUSCULAR | Status: AC
  Administered 2018-05-11: 20 mg via INTRAVENOUS
  Filled 2018-05-11: qty 2

## 2018-05-11 MED ORDER — MAGNESIUM SULFATE 2 GM/50ML IV SOLN
2.0000 g | Freq: Once | INTRAVENOUS | Status: AC
  Administered 2018-05-11: 2 g via INTRAVENOUS
  Filled 2018-05-11: qty 50

## 2018-05-11 MED ORDER — INSULIN ASPART 100 UNIT/ML ~~LOC~~ SOLN
0.0000 [IU] | SUBCUTANEOUS | Status: DC
  Administered 2018-05-12: 2 [IU] via SUBCUTANEOUS
  Administered 2018-05-12: 3 [IU] via SUBCUTANEOUS
  Administered 2018-05-12: 2 [IU] via SUBCUTANEOUS
  Administered 2018-05-12: 3 [IU] via SUBCUTANEOUS
  Administered 2018-05-13 (×6): 5 [IU] via SUBCUTANEOUS
  Administered 2018-05-14: 8 [IU] via SUBCUTANEOUS
  Administered 2018-05-14: 5 [IU] via SUBCUTANEOUS
  Administered 2018-05-14: 2 [IU] via SUBCUTANEOUS
  Administered 2018-05-14: 8 [IU] via SUBCUTANEOUS
  Administered 2018-05-14: 5 [IU] via SUBCUTANEOUS

## 2018-05-11 NOTE — ED Notes (Signed)
Cardiology at bedside.
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
## 2018-05-11 NOTE — ED Notes (Signed)
ICU provider at bedside

## 2018-05-11 NOTE — ED Notes (Addendum)
HGB 6.3 called to this rn by lab, Dr Roxanne Mins notified.He ordered to go ahead and given heparin

## 2018-05-11 NOTE — ED Notes (Signed)
Pt noted to be back in NSR rate of 64

## 2018-05-11 NOTE — ED Notes (Signed)
Pt is tolerating diltiazem well. Heparin was stopped per MD orders. Pt given lasix. Pt axox4, speaking in complete sentences. Diltiazem at 12.5mg / hr now. Pt's HR between 100-115

## 2018-05-11 NOTE — ED Provider Notes (Addendum)
I see the patient in signout from Dr. Roxanne Mins, briefly the patient is a 62 year old female with a chief complaint of shortness of breath.  Found to be in A. fib with RVR.  Going on for the past 3 days.  She has a history of severe aortic stenosis and myelodysplastic syndrome.  She is found to have anemia of 6.3 her creatinine had bumped from a baseline of about 1/2-3.3.  Plan was for hospitalist admission.  Hospitalist is requesting an evaluation by critical care based on the patient's aortic stenosis as well as new renal dysfunction and anion gap acidosis.  I discussed the case with critical care who will come and evaluate the patient.  I also discussed the case with cardiology who will come and evaluate at bedside.   I discussed the case with Dr. Debara Pickett, cardiology he felt that there was too much risk with a heparin drip and recommended stopping it with her anemia.  He did recommend that she be admitted to the ICU.  CRITICAL CARE Performed by: Cecilio Asper   Total critical care time: 35 minutes  Critical care time was exclusive of separately billable procedures and treating other patients.  Critical care was necessary to treat or prevent imminent or life-threatening deterioration.  Critical care was time spent personally by me on the following activities: development of treatment plan with patient and/or surrogate as well as nursing, discussions with consultants, evaluation of patient's response to treatment, examination of patient, obtaining history from patient or surrogate, ordering and performing treatments and interventions, ordering and review of laboratory studies, ordering and review of radiographic studies, pulse oximetry and re-evaluation of patient's condition.     [name], DO 05/11/18 1114
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
## 2018-05-11 NOTE — ED Notes (Signed)
CBG 100. Lennette Bihari (RN) notified

## 2018-05-11 NOTE — H&P (Signed)
NAME:  Patricia Mata, MRN:  [id number], DOB:  1956-04-19, LOS: 0 ADMISSION DATE:  05/11/2018, CONSULTATION DATE:  10/28 REFERRING MD:  EDP, CHIEF COMPLAINT:  AFib RVR    Brief History   62yo female with hx severe AS, severe TR, severe pulmonary HTN, stage 3 colon cancer and myelodysplastic syndrome and anemia requiring frequent transfusions presented 10/28 with dyspnea found to have AFib with RVR, acute on chronic anemia and AKI.  Cardiology requesting ICU admission per PCCM.   Past Medical History  Severe left ear Severe TR Severe pulmonary hypertension Stage III colon cancer Myelodysplastic syndrome-diagnosed 2017 Chronic microcytic anemia Hypertension Diabetes Significant Hospital Events     Consults: date of consult/date signed off & final recs:  Cardiology 10/28  Procedures (surgical and bedside):    Significant Diagnostic Tests:    Micro Data:    Antimicrobials:    Subjective:  Feeling better since arrival  Objective   Blood pressure 110/84, pulse 85, temperature (!) 97.2 F (36.2 C), temperature source Oral, resp. rate 18, height 5\' 4"  (1.626 m), weight 97.1 kg, SpO2 99 %.        Intake/Output Summary (Last 24 hours) at 05/11/2018 1131 Last data filed at 05/11/2018 1030 Gross per 24 hour  Intake 539.69 ml  Output -  Net 539.69 ml   Filed Weights   05/11/18 0626  Weight: 97.1 kg    Examination: General: chronically ill appearing female, NAD in ER  HENT: mm moist, no sig JVD Lungs: resps even non labored on [place], slightly tachypneic, diminished otherwise fairly clear  Cardiovascular: s1s2 irreg, 3/6 murmur Abdomen: round, soft, non tender  Extremities: Warm and dry, scant BLE edema Neuro: Awake, alert, appropriate  Resolved Hospital Problem list     Assessment & Plan:  A. fib with RVR Plan- Continue Cardizem drip to keep HR <100 Holding on heparin given significant anemia Cardiology following Holding home metoprolol   Severe aortic  stenosis Severe TR Severe pulmonary hypertension - PA peak pressure >13mmHg Hx HTN Plan- Cardiology following Unclear if candidate for TAVR-would need right and left cath which cannot be done at this time related to AKI Follow-up BNP Consider heart failure team involvement Holding on repeat echo for now-most recent 10/19  AKI Plan- Follow-up chemistry Avoid nephrotoxic agents  Acute on chronic anemia Myelodysplastic syndrome Plan- 2 units PRBC Lasix 20 mg in between units Goal hemoglobin greater than 8 Follow-up CBC every 12 hours Holding heparin for now as above  Hypomagnesemia Plan- Mg replacement given in ER Follow-up chemistries  History of diabetes Plan- Hold home lantus for now while NPO Sliding scale insulin  Disposition / Summary of Today's Plan 05/11/18   We will admit to ICU per cardiology request Transfuse 2 units PRBC Continue Cardizem drip for heart rate control     Diet: N.p.o. for now Pain/Anxiety/Delirium protocol (if indicated): N/A VAP protocol (if indicated): N/A DVT prophylaxis: SCDs GI prophylaxis: N/A Hyperglycemia protocol: SSI Mobility: Bedrest Code Status: Full code Family Communication: discussed at length with husband and son at bedside in ER 10/28  Labs   CBC: Recent Labs  Lab 05/11/18 0650  WBC 5.7  NEUTROABS 3.7  HGB 6.3*  HCT 19.9*  MCV 97.1  PLT 852    Basic Metabolic Panel: Recent Labs  Lab 05/11/18 0650  NA 130*  K 4.4  CL 97*  CO2 8*  GLUCOSE 164*  BUN 48*  CREATININE 3.37*  CALCIUM 8.8*  MG 1.2*   GFR: Estimated Creatinine Clearance:  19.3 mL/min (A) (by C-G formula based on SCr of 3.37 mg/dL (H)). Recent Labs  Lab 05/11/18 0650  WBC 5.7    Liver Function Tests: No results for input(s): AST, ALT, ALKPHOS, BILITOT, PROT, ALBUMIN in the last 168 hours. No results for input(s): LIPASE, AMYLASE in the last 168 hours. No results for input(s): AMMONIA in the last 168 hours.  ABG No results found  for: PHART, PCO2ART, PO2ART, HCO3, TCO2, ACIDBASEDEF, O2SAT   Coagulation Profile: Recent Labs  Lab 05/11/18 0650  INR 1.78    Cardiac Enzymes: No results for input(s): CKTOTAL, CKMB, CKMBINDEX, TROPONINI in the last 168 hours.  HbA1C: Hgb A1c MFr Bld  Date/Time Value Ref Range Status  06/14/2017 01:11 PM 11.6 (H) 4.8 - 5.6 % Final    Comment:    (NOTE)         Prediabetes: 5.7 - 6.4         Diabetes: >6.4         Glycemic control for adults with diabetes: <7.0     CBG: No results for input(s): GLUCAP in the last 168 hours.  Admitting History of Present Illness.   63 year old female with history of stage III colon cancer, myelodysplastic syndrome, chronic macrocytic anemia requiring frequent transfusions, diabetes, hypertension, severe AS, severe TR, severe pulmonary hypertension with recent echocardiogram showing RVSP 112 mmHg, dilated IVC, severe left atrial enlargement, severe TR, severe aortic stenosis.  She presented 10/28 with several weeks of progressive dyspnea, much worse over the last 3 days and acutely worse on the morning of admission.  In ER she was found to have BNP 2681, hemoglobin 6.3, A. fib with RVR, serum creatinine of 3.37 which is up from 1.05 approximately 2 weeks ago.  Started on Cardizem drip with good control of heart rate and on my exam is feeling much better with significant improvement in her dyspnea. Denies chest pain, hemoptysis, purulent secretions, abdominal pain, BLE edema.  Review of Systems:   As per HPI - All other systems reviewed and were neg.    Past Medical History  She,  has a past medical history of Cancer ([place]), Diabetes mellitus without complication ([place]), Hypertension, and Macrocytic anemia.   Surgical History    Past Surgical History:  Procedure Laterality Date  . COLON SURGERY    . IR FLUORO GUIDE PORT INSERTION RIGHT  04/02/2017  . IR US GUIDE VASC ACCESS RIGHT  04/02/2017     Social History   Social History    Socioeconomic History  . Marital status: Married    Spouse name: Not on file  . Number of children: Not on file  . Years of education: Not on file  . Highest education level: Not on file  Occupational History  . Not on file  Social Needs  . Financial resource strain: Not on file  . Food insecurity:    Worry: Not on file    Inability: Not on file  . Transportation needs:    Medical: Not on file    Non-medical: Not on file  Tobacco Use  . Smoking status: Former Smoker    Types: Cigarettes    Last attempt to quit: 01/31/2001    Years since quitting: 17.2  . Smokeless tobacco: Former Network engineer and Sexual Activity  . Alcohol use: Yes    Alcohol/week: 2.0 standard drinks    Types: 2 Shots of liquor per week  . Drug use: Not on file  . Sexual activity: Not on file  Lifestyle  . Physical activity:    Days per week: Not on file    Minutes per session: Not on file  . Stress: Not on file  Relationships  . Social connections:    Talks on phone: Not on file    Gets together: Not on file    Attends religious service: Not on file    Active member of club or organization: Not on file    Attends meetings of clubs or organizations: Not on file    Relationship status: Not on file  . Intimate partner violence:    Fear of current or ex partner: Not on file    Emotionally abused: Not on file    Physically abused: Not on file    Forced sexual activity: Not on file  Other Topics Concern  . Not on file  Social History Narrative  . Not on file  ,  reports that she quit smoking about 17 years ago. Her smoking use included cigarettes. She has quit using smokeless tobacco. She reports that she drinks about 2.0 standard drinks of alcohol per week.   Family History   Her family history includes HIV/AIDS in her brother; Heart attack in her father; Hypertension in her brother, mother, sister, sister, and sister; Prostate cancer in her brother.   Allergies Allergies  Allergen  Reactions  . Ancef [Cefazolin] Itching    Severe itching- after procedure, ancef was the antibiotic.-04/02/17  . Sulfa Antibiotics Rash and Other (See Comments)    Blisters, also     Home Medications  Prior to Admission medications   Medication Sig Start Date End Date Taking? Authorizing Provider  atorvastatin (LIPITOR) 40 MG tablet Take 1 tablet (40 mg total) by mouth daily. 04/23/18  Yes [name], FNP  Azilsartan-Chlorthalidone (EDARBYCLOR) 40-12.5 MG TABS Take 1 tablet by mouth daily.    Yes [provider]  colchicine 0.6 MG tablet Take 0.6 mg by mouth 2 (two) times daily as needed (gout flare).  11/02/15  Yes [provider]  furosemide (LASIX) 20 MG tablet TAKE 1 TABLET BY MOUTH TWICE A DAY 03/19/18  Yes Shadad, Mathis Dad, MD  hydrALAZINE (APRESOLINE) 25 MG tablet Take 2 tablets (50 mg total) by mouth 3 (three) times daily. 02/19/18  Yes Lorretta Harp, MD  insulin glargine (LANTUS) 100 unit/mL SOPN Inject 0.14 mLs (14 Units total) into the skin daily after breakfast. Patient taking differently: Inject 10 Units into the skin daily after breakfast.  06/16/17  Yes Bonnielee Haff, MD  JANUMET XR 50-500 MG TB24 Take 1 tablet by mouth daily. With evening meal 02/07/18  Yes [provider]  latanoprost (XALATAN) 0.005 % ophthalmic solution Place 1 drop into both eyes at bedtime.    Yes [provider]  lidocaine-prilocaine (EMLA) cream Apply a quarter size of cream to port 1-2 hours prior to access. Cover with saran wrap. 04/17/17  Yes [name], MD  megestrol (MEGACE) 400 MG/10ML suspension Take 10 mLs (400 mg total) by mouth 2 (two) times daily. 03/06/18  Yes [name], MD  metoprolol tartrate (LOPRESSOR) 50 MG tablet TAKE 1 TABLET BY MOUTH TWICE A DAY WITH FOOD 05/04/18  Yes [name], FNP  Multiple Vitamins-Minerals (CENTRUM SILVER 50+WOMEN) TABS Take 1 tablet by mouth daily.   Yes [provider]  prochlorperazine (COMPAZINE) 10 MG  tablet Take 1 tablet (10 mg total) by mouth every 6 (six) hours as needed for nausea or vomiting. 04/28/17  Yes [name],  MD  gabapentin (NEURONTIN) 100 MG capsule Take 2 capsules (200 mg total) by mouth 3 (three) times daily. Patient not taking: Reported on 05/11/2018 10/28/17   Harle Stanford., PA-C  Insulin Pen Needle 29G X 5MM MISC Use as directed 06/16/17   Bonnielee Haff, MD  oxyCODONE-acetaminophen (PERCOCET/ROXICET) 5-325 MG tablet Take 1-2 tablets by mouth every 4 (four) hours as needed for severe pain. Patient not taking: Reported on 05/11/2018 10/27/17   Harle Stanford., PA-C  predniSONE (DELTASONE) 5 MG tablet 6 tab x 1 day, 5 tab x 1 day, 4 tab x 1 day, 3 tab x 1 day, 2 tab x 1 day, 1 tab x 1 day, stop Patient not taking: Reported on 05/11/2018 10/28/17   Harle Stanford., PA-C     Critical care time: 5 min       Nickolas Madrid, NP 05/11/2018  11:31 AM Pager: [phone] or [phone]

## 2018-05-11 NOTE — Progress Notes (Signed)
ANTICOAGULATION CONSULT NOTE - Initial Consult  Pharmacy Consult for Heparin Indication: atrial fibrillation  Allergies  Allergen Reactions  . Ancef [Cefazolin] Itching    Severe itching- after procedure, ancef was the antibiotic.-04/02/17  . Sulfa Antibiotics Rash and Other (See Comments)    Blisters, also    Patient Measurements: Height: 5\' 4"  (162.6 cm) Weight: 214 lb (97.1 kg) IBW/kg (Calculated) : 54.7 Heparin Dosing Weight: 80 kg  Vital Signs: Temp: 97.2 F (36.2 C) (10/28 0626) Temp Source: Oral (10/28 0626) Pulse Rate: 148 (10/28 0626)  Labs: No results for input(s): HGB, HCT, PLT, APTT, LABPROT, INR, HEPARINUNFRC, HEPRLOWMOCWT, CREATININE, CKTOTAL, CKMB, TROPONINI in the last 72 hours.  Estimated Creatinine Clearance: 62.1 mL/min (A) (by C-G formula based on SCr of 1.05 mg/dL (H)).   Medical History: Past Medical History:  Diagnosis Date  . Cancer ([place])   . Diabetes mellitus without complication ([place])   . Hypertension   . Macrocytic anemia     Medications:  Current Facility-Administered Medications on File Prior to Encounter  Medication Dose Route Frequency Provider Last Rate Last Dose  . sodium chloride flush (NS) 0.9 % injection 10 mL  10 mL Intravenous PRN [name], MD       Current Outpatient Medications on File Prior to Encounter  Medication Sig Dispense Refill  . atorvastatin (LIPITOR) 40 MG tablet Take 1 tablet (40 mg total) by mouth daily. 90 tablet 0  . Azilsartan-Chlorthalidone (EDARBYCLOR) 40-12.5 MG TABS Take 1 tablet by mouth daily.     . colchicine 0.6 MG tablet Take 0.6 mg by mouth 2 (two) times daily as needed (gout flare).   2  . furosemide (LASIX) 20 MG tablet TAKE 1 TABLET BY MOUTH TWICE A DAY 60 tablet 0  . gabapentin (NEURONTIN) 100 MG capsule Take 2 capsules (200 mg total) by mouth 3 (three) times daily. 120 capsule 1  . hydrALAZINE (APRESOLINE) 25 MG tablet Take 2 tablets (50 mg total) by mouth 3 (three) times daily. 540 tablet  3  . insulin glargine (LANTUS) 100 unit/mL SOPN Inject 0.14 mLs (14 Units total) into the skin daily after breakfast. (Patient taking differently: Inject 10 Units into the skin daily after breakfast. ) 15 mL 11  . Insulin Pen Needle 29G X 5MM MISC Use as directed 200 each 0  . latanoprost (XALATAN) 0.005 % ophthalmic solution Place 1 drop into both eyes at bedtime.     . lidocaine-prilocaine (EMLA) cream Apply a quarter size of cream to port 1-2 hours prior to access. Cover with saran wrap. 30 g 1  . megestrol (MEGACE) 400 MG/10ML suspension Take 10 mLs (400 mg total) by mouth 2 (two) times daily. 240 mL 1  . Melatonin 10 MG CAPS Take 10 mg by mouth at bedtime as needed (sleep).    . metoprolol tartrate (LOPRESSOR) 50 MG tablet TAKE 1 TABLET BY MOUTH TWICE A DAY WITH FOOD 180 tablet 1  . oxyCODONE-acetaminophen (PERCOCET/ROXICET) 5-325 MG tablet Take 1-2 tablets by mouth every 4 (four) hours as needed for severe pain. 30 tablet 0  . predniSONE (DELTASONE) 5 MG tablet 6 tab x 1 day, 5 tab x 1 day, 4 tab x 1 day, 3 tab x 1 day, 2 tab x 1 day, 1 tab x 1 day, stop 21 tablet 0  . prochlorperazine (COMPAZINE) 10 MG tablet Take 1 tablet (10 mg total) by mouth every 6 (six) hours as needed for nausea or vomiting. 30 tablet 1  Assessment: 62 y.o. female with new onset Afib for heparin  Goal of Therapy:  Heparin level 0.3-0.7 units/ml Monitor platelets by anticoagulation protocol: Yes   Plan:  Heparin 2000 units IV bolus, then start heparin 1250 units/hr Check heparin level in 6 hours.   Roben Schliep, [place] Curb 05/11/2018,7:07 AM

## 2018-05-11 NOTE — ED Triage Notes (Signed)
Patient arrived with EMS from home reports worsening SOB , palpitations with occasional productive cough worse with exertion onset last week . She received Metropolol 10 mg IV for Afib by EMS . HR= 150's -170's Afib /irregular.

## 2018-05-12 ENCOUNTER — Ambulatory Visit: Payer: BC Managed Care – PPO | Admitting: Cardiovascular Disease

## 2018-05-12 ENCOUNTER — Other Ambulatory Visit: Payer: Self-pay

## 2018-05-12 ENCOUNTER — Inpatient Hospital Stay (HOSPITAL_COMMUNITY): Payer: BC Managed Care – PPO

## 2018-05-12 ENCOUNTER — Telehealth: Payer: Self-pay | Admitting: Oncology

## 2018-05-12 DIAGNOSIS — E872 Acidosis, unspecified: Secondary | ICD-10-CM

## 2018-05-12 DIAGNOSIS — I480 Paroxysmal atrial fibrillation: Principal | ICD-10-CM

## 2018-05-12 DIAGNOSIS — R570 Cardiogenic shock: Secondary | ICD-10-CM

## 2018-05-12 DIAGNOSIS — I361 Nonrheumatic tricuspid (valve) insufficiency: Secondary | ICD-10-CM

## 2018-05-12 DIAGNOSIS — J9601 Acute respiratory failure with hypoxia: Secondary | ICD-10-CM

## 2018-05-12 DIAGNOSIS — N179 Acute kidney failure, unspecified: Secondary | ICD-10-CM

## 2018-05-12 LAB — MAGNESIUM: Magnesium: 2.3 mg/dL (ref 1.7–2.4)

## 2018-05-12 LAB — POCT I-STAT 3, ART BLOOD GAS (G3+)
Acid-base deficit: 27 mmol/L — ABNORMAL HIGH (ref 0.0–2.0)
Acid-base deficit: 21 mmol/L — ABNORMAL HIGH (ref 0.0–2.0)
Acid-base deficit: 18 mmol/L — ABNORMAL HIGH (ref 0.0–2.0)
Acid-base deficit: 18 mmol/L — ABNORMAL HIGH (ref 0.0–2.0)
Acid-base deficit: 12 mmol/L — ABNORMAL HIGH (ref 0.0–2.0)
Bicarbonate: 3.7 mmol/L — ABNORMAL LOW (ref 20.0–28.0)
Bicarbonate: 6.8 mmol/L — ABNORMAL LOW (ref 20.0–28.0)
Bicarbonate: 7.1 mmol/L — ABNORMAL LOW (ref 20.0–28.0)
Bicarbonate: 7.7 mmol/L — ABNORMAL LOW (ref 20.0–28.0)
Bicarbonate: 11.5 mmol/L — ABNORMAL LOW (ref 20.0–28.0)
O2 Saturation: 89 %
O2 Saturation: 100 %
O2 Saturation: 100 %
O2 Saturation: 100 %
O2 Saturation: 100 %
Patient temperature: 35
Patient temperature: 35.2
Patient temperature: 36.3
Patient temperature: 36.5
Patient temperature: 36.2
TCO2: <5 mmol/L — ABNORMAL LOW (ref 22–32)
TCO2: 7 mmol/L — ABNORMAL LOW (ref 22–32)
TCO2: 8 mmol/L — ABNORMAL LOW (ref 22–32)
TCO2: 8 mmol/L — ABNORMAL LOW (ref 22–32)
TCO2: 12 mmol/L — ABNORMAL LOW (ref 22–32)
pCO2 arterial: 16.9 mmHg — CL (ref 32.0–48.0)
pCO2 arterial: 18.8 mmHg — CL (ref 32.0–48.0)
pCO2 arterial: 15.9 mmHg — CL (ref 32.0–48.0)
pCO2 arterial: 18.5 mmHg — CL (ref 32.0–48.0)
pCO2 arterial: 17.3 mmHg — CL (ref 32.0–48.0)
pH, Arterial: 6.928 — CL (ref 7.350–7.450)
pH, Arterial: 7.153 — CL (ref 7.350–7.450)
pH, Arterial: 7.223 — ABNORMAL LOW (ref 7.350–7.450)
pH, Arterial: 7.253 — ABNORMAL LOW (ref 7.350–7.450)
pH, Arterial: 7.428 (ref 7.350–7.450)
pO2, Arterial: 82 mmHg — ABNORMAL LOW (ref 83.0–108.0)
pO2, Arterial: 469 mmHg — ABNORMAL HIGH (ref 83.0–108.0)
pO2, Arterial: 240 mmHg — ABNORMAL HIGH (ref 83.0–108.0)
pO2, Arterial: 241 mmHg — ABNORMAL HIGH (ref 83.0–108.0)
pO2, Arterial: 249 mmHg — ABNORMAL HIGH (ref 83.0–108.0)

## 2018-05-12 LAB — GLUCOSE, CAPILLARY
Glucose-Capillary: 104 mg/dL — ABNORMAL HIGH (ref 70–99)
Glucose-Capillary: 106 mg/dL — ABNORMAL HIGH (ref 70–99)
Glucose-Capillary: 124 mg/dL — ABNORMAL HIGH (ref 70–99)
Glucose-Capillary: 126 mg/dL — ABNORMAL HIGH (ref 70–99)
Glucose-Capillary: 180 mg/dL — ABNORMAL HIGH (ref 70–99)

## 2018-05-12 LAB — BASIC METABOLIC PANEL
BUN: 58 mg/dL — ABNORMAL HIGH (ref 8–23)
CO2: <7 mmol/L — ABNORMAL LOW (ref 22–32)
Calcium: 8.4 mg/dL — ABNORMAL LOW (ref 8.9–10.3)
Chloride: 100 mmol/L (ref 98–111)
Creatinine, Ser: 3.94 mg/dL — ABNORMAL HIGH (ref 0.44–1.00)
GFR calc Af Amer: 13 mL/min — ABNORMAL LOW (ref >60)
GFR calc non Af Amer: 11 mL/min — ABNORMAL LOW (ref >60)
Glucose, Bld: 114 mg/dL — ABNORMAL HIGH (ref 70–99)
Potassium: 5.4 mmol/L — ABNORMAL HIGH (ref 3.5–5.1)
Sodium: 133 mmol/L — ABNORMAL LOW (ref 135–145)

## 2018-05-12 LAB — RENAL FUNCTION PANEL
Albumin: 3.4 g/dL — ABNORMAL LOW (ref 3.5–5.0)
Anion gap: 29 — ABNORMAL HIGH (ref 5–15)
BUN: 52 mg/dL — ABNORMAL HIGH (ref 8–23)
CO2: 12 mmol/L — ABNORMAL LOW (ref 22–32)
Calcium: 8.1 mg/dL — ABNORMAL LOW (ref 8.9–10.3)
Chloride: 96 mmol/L — ABNORMAL LOW (ref 98–111)
Creatinine, Ser: 3.61 mg/dL — ABNORMAL HIGH (ref 0.44–1.00)
GFR calc Af Amer: 14 mL/min — ABNORMAL LOW (ref >60)
GFR calc non Af Amer: 12 mL/min — ABNORMAL LOW (ref >60)
Glucose, Bld: 195 mg/dL — ABNORMAL HIGH (ref 70–99)
Phosphorus: 7.2 mg/dL — ABNORMAL HIGH (ref 2.5–4.6)
Potassium: 3.9 mmol/L (ref 3.5–5.1)
Sodium: 137 mmol/L (ref 135–145)

## 2018-05-12 LAB — CBC
HCT: 23.4 % — ABNORMAL LOW (ref 36.0–46.0)
HCT: 23.2 % — ABNORMAL LOW (ref 36.0–46.0)
Hemoglobin: 7 g/dL — ABNORMAL LOW (ref 12.0–15.0)
Hemoglobin: 7.8 g/dL — ABNORMAL LOW (ref 12.0–15.0)
MCH: 30 pg (ref 26.0–34.0)
MCH: 29.5 pg (ref 26.0–34.0)
MCHC: 29.9 g/dL — ABNORMAL LOW (ref 30.0–36.0)
MCHC: 33.6 g/dL (ref 30.0–36.0)
MCV: 100.4 fL — ABNORMAL HIGH (ref 80.0–100.0)
MCV: 87.9 fL (ref 80.0–100.0)
Platelets: 213 10*3/uL (ref 150–400)
Platelets: 163 10*3/uL (ref 150–400)
RBC: 2.33 MIL/uL — ABNORMAL LOW (ref 3.87–5.11)
RBC: 2.64 MIL/uL — ABNORMAL LOW (ref 3.87–5.11)
RDW: 18.2 % — ABNORMAL HIGH (ref 11.5–15.5)
RDW: 16.9 % — ABNORMAL HIGH (ref 11.5–15.5)
WBC: 13.2 10*3/uL — ABNORMAL HIGH (ref 4.0–10.5)
WBC: 9.8 10*3/uL (ref 4.0–10.5)
nRBC: 0.5 % — ABNORMAL HIGH (ref 0.0–0.2)
nRBC: 0.5 % — ABNORMAL HIGH (ref 0.0–0.2)

## 2018-05-12 LAB — LACTIC ACID, PLASMA
Lactic Acid, Venous: 15.6 mmol/L (ref 0.5–1.9)
Lactic Acid, Venous: 13.4 mmol/L (ref 0.5–1.9)
Lactic Acid, Venous: 17.7 mmol/L (ref 0.5–1.9)
Lactic Acid, Venous: 14.2 mmol/L (ref 0.5–1.9)
Lactic Acid, Venous: 6 mmol/L (ref 0.5–1.9)

## 2018-05-12 LAB — ECHOCARDIOGRAM COMPLETE
Height: 64 in
Weight: 3569.69 oz

## 2018-05-12 LAB — PREPARE RBC (CROSSMATCH)

## 2018-05-12 LAB — PHOSPHORUS: Phosphorus: 10.1 mg/dL — ABNORMAL HIGH (ref 2.5–4.6)

## 2018-05-12 MED ORDER — VANCOMYCIN HCL IN DEXTROSE 1-5 GM/200ML-% IV SOLN: 1000.0000 mg | INTRAVENOUS | Status: DC

## 2018-05-12 MED ORDER — PHENYLEPHRINE HCL-NACL 10-0.9 MG/250ML-% IV SOLN
0.0000 ug/min | INTRAVENOUS | Status: DC
  Filled 2018-05-12: qty 250

## 2018-05-12 MED ORDER — SODIUM CHLORIDE 0.9 % IV SOLN
1.0000 g | Freq: Two times a day (BID) | INTRAVENOUS | Status: DC
  Administered 2018-05-12 – 2018-05-15 (×6): 1 g via INTRAVENOUS
  Filled 2018-05-12 (×6): qty 1

## 2018-05-12 MED ORDER — VITAL HIGH PROTEIN PO LIQD
1000.0000 mL | ORAL | Status: DC
  Administered 2018-05-12 – 2018-05-16 (×4): 1000 mL

## 2018-05-12 MED ORDER — SODIUM BICARBONATE 8.4 % IV SOLN
200.0000 meq | Freq: Once | INTRAVENOUS | Status: AC
  Administered 2018-05-12: 200 meq via INTRAVENOUS

## 2018-05-12 MED ORDER — PRISMASOL BGK 0/2.5 32-2.5 MEQ/L IV SOLN: INTRAVENOUS | Status: DC

## 2018-05-12 MED ORDER — CHLORHEXIDINE GLUCONATE CLOTH 2 % EX PADS
6.0000 | MEDICATED_PAD | Freq: Every day | CUTANEOUS | Status: DC
  Administered 2018-05-13 – 2018-05-20 (×7): 6 via TOPICAL

## 2018-05-12 MED ORDER — NOREPINEPHRINE 16 MG/250ML-% IV SOLN
0.0000 ug/min | INTRAVENOUS | Status: DC
  Administered 2018-05-12: 22 ug/min via INTRAVENOUS
  Administered 2018-05-13: 14 ug/min via INTRAVENOUS
  Filled 2018-05-12 (×3): qty 250

## 2018-05-12 MED ORDER — SODIUM CHLORIDE 0.9% FLUSH
10.0000 mL | Freq: Two times a day (BID) | INTRAVENOUS | Status: DC
  Administered 2018-05-12 – 2018-05-18 (×10): 10 mL
  Administered 2018-05-18: 20 mL
  Administered 2018-05-19: 30 mL
  Administered 2018-05-20 (×2): 10 mL

## 2018-05-12 MED ORDER — VANCOMYCIN HCL 10 G IV SOLR
2000.0000 mg | Freq: Once | INTRAVENOUS | Status: AC
  Administered 2018-05-12: 2000 mg via INTRAVENOUS
  Filled 2018-05-12 (×2): qty 2000

## 2018-05-12 MED ORDER — SODIUM CHLORIDE 0.9 % FOR CRRT
INTRAVENOUS_CENTRAL | Status: DC | PRN
  Filled 2018-05-12: qty 1000

## 2018-05-12 MED ORDER — VITAL HIGH PROTEIN PO LIQD: 1000.0000 mL | ORAL | Status: DC

## 2018-05-12 MED ORDER — SODIUM BICARBONATE 8.4 % IV SOLN
INTRAVENOUS | Status: AC
  Administered 2018-05-12: 50 meq
  Filled 2018-05-12: qty 100

## 2018-05-12 MED ORDER — SODIUM BICARBONATE 8.4 % IV SOLN
INTRAVENOUS | Status: DC
  Administered 2018-05-12: 07:00:00 via INTRAVENOUS
  Filled 2018-05-12 (×2): qty 150

## 2018-05-12 MED ORDER — HEPARIN SODIUM (PORCINE) 1000 UNIT/ML DIALYSIS
1000.0000 [IU] | INTRAMUSCULAR | Status: DC | PRN
  Administered 2018-05-12: 4000 [IU] via INTRAVENOUS_CENTRAL
  Filled 2018-05-12: qty 4
  Filled 2018-05-12 (×2): qty 6

## 2018-05-12 MED ORDER — SODIUM BICARBONATE 8.4 % IV SOLN
INTRAVENOUS | Status: AC
  Administered 2018-05-12: 100 meq
  Filled 2018-05-12: qty 200

## 2018-05-12 MED ORDER — MIDAZOLAM HCL 2 MG/2ML IJ SOLN
2.0000 mg | INTRAMUSCULAR | Status: DC | PRN
  Administered 2018-05-12 – 2018-05-15 (×5): 2 mg via INTRAVENOUS
  Filled 2018-05-12 (×5): qty 2

## 2018-05-12 MED ORDER — SODIUM CHLORIDE 0.9 % IV SOLN
INTRAVENOUS | Status: DC | PRN
  Administered 2018-05-12: 1000 mL via INTRAVENOUS
  Administered 2018-05-13 (×2): 250 mL via INTRAVENOUS
  Administered 2018-05-15 – 2018-05-16 (×2): 500 mL via INTRAVENOUS

## 2018-05-12 MED ORDER — CHLORHEXIDINE GLUCONATE 0.12% ORAL RINSE (MEDLINE KIT)
15.0000 mL | Freq: Two times a day (BID) | OROMUCOSAL | Status: DC
  Administered 2018-05-12 – 2018-05-18 (×13): 15 mL via OROMUCOSAL

## 2018-05-12 MED ORDER — SODIUM CHLORIDE 0.9% IV SOLUTION: Freq: Once | INTRAVENOUS | Status: DC

## 2018-05-12 MED ORDER — DEXTROSE 5 % IV SOLN
0.5000 g | Freq: Three times a day (TID) | INTRAVENOUS | Status: DC
  Filled 2018-05-12 (×2): qty 0.5

## 2018-05-12 MED ORDER — PRISMASOL BGK 4/2.5 32-4-2.5 MEQ/L REPLACEMENT SOLN: Status: DC

## 2018-05-12 MED ORDER — FENTANYL 2500MCG IN NS 250ML (10MCG/ML) PREMIX INFUSION
25.0000 ug/h | INTRAVENOUS | Status: DC
  Administered 2018-05-12: 25 ug/h via INTRAVENOUS
  Administered 2018-05-12 – 2018-05-13 (×4): 400 ug/h via INTRAVENOUS
  Administered 2018-05-13: 150 ug/h via INTRAVENOUS
  Administered 2018-05-14: 225 ug/h via INTRAVENOUS
  Administered 2018-05-15: 100 ug/h via INTRAVENOUS
  Filled 2018-05-12 (×8): qty 250

## 2018-05-12 MED ORDER — SODIUM CHLORIDE 0.9 % IV SOLN
500.0000 mg | Freq: Two times a day (BID) | INTRAVENOUS | Status: DC
  Administered 2018-05-12: 500 mg via INTRAVENOUS
  Filled 2018-05-12 (×2): qty 0.5

## 2018-05-12 MED ORDER — PRISMASOL BGK 4/2.5 32-4-2.5 MEQ/L IV SOLN
INTRAVENOUS | Status: DC
  Administered 2018-05-12 – 2018-05-15 (×8): via INTRAVENOUS_CENTRAL
  Filled 2018-05-12 (×15): qty 5000

## 2018-05-12 MED ORDER — PRISMASOL BGK 4/2.5 32-4-2.5 MEQ/L IV SOLN
INTRAVENOUS | Status: DC
  Administered 2018-05-12 – 2018-05-14 (×4): via INTRAVENOUS_CENTRAL
  Filled 2018-05-12 (×10): qty 5000

## 2018-05-12 MED ORDER — PRISMASOL BGK 0/2.5 32-2.5 MEQ/L REPLACEMENT SOLN: Status: DC

## 2018-05-12 MED ORDER — SODIUM CHLORIDE 0.9 % IV BOLUS
1000.0000 mL | Freq: Once | INTRAVENOUS | Status: AC
  Administered 2018-05-12: 1000 mL via INTRAVENOUS

## 2018-05-12 MED ORDER — MIDAZOLAM HCL 2 MG/2ML IJ SOLN: 2.0000 mg | Freq: Once | INTRAMUSCULAR | Status: DC

## 2018-05-12 MED ORDER — PRISMASOL BGK 4/2.5 32-4-2.5 MEQ/L IV SOLN
INTRAVENOUS | Status: DC
  Administered 2018-05-12 – 2018-05-16 (×23): via INTRAVENOUS_CENTRAL
  Filled 2018-05-12 (×44): qty 5000

## 2018-05-12 MED ORDER — VANCOMYCIN VARIABLE DOSE PER UNSTABLE RENAL FUNCTION (PHARMACIST DOSING): Status: DC

## 2018-05-12 MED ORDER — PANTOPRAZOLE SODIUM 40 MG PO PACK
40.0000 mg | PACK | Freq: Every day | ORAL | Status: DC
  Administered 2018-05-12 – 2018-05-18 (×7): 40 mg
  Filled 2018-05-12 (×7): qty 20

## 2018-05-12 MED ORDER — PRISMASOL BGK 0/2.5 32-2.5 MEQ/L IV SOLN
INTRAVENOUS | Status: DC
  Administered 2018-05-12 (×2): 1 via INTRAVENOUS_CENTRAL
  Filled 2018-05-12 (×7): qty 5000

## 2018-05-12 MED ORDER — PRISMASOL BGK 0/2.5 32-2.5 MEQ/L REPLACEMENT SOLN
Status: DC
  Administered 2018-05-12: 1 via INTRAVENOUS_CENTRAL
  Filled 2018-05-12 (×6): qty 5000

## 2018-05-12 MED ORDER — SODIUM CHLORIDE 0.9% IV SOLUTION
Freq: Once | INTRAVENOUS | Status: AC
  Administered 2018-05-12: 06:00:00 via INTRAVENOUS

## 2018-05-12 MED ORDER — ORAL CARE MOUTH RINSE
15.0000 mL | OROMUCOSAL | Status: DC
  Administered 2018-05-12 – 2018-05-18 (×60): 15 mL via OROMUCOSAL

## 2018-05-12 MED ORDER — FENTANYL CITRATE (PF) 100 MCG/2ML IJ SOLN
50.0000 ug | Freq: Once | INTRAMUSCULAR | Status: AC
  Administered 2018-05-12: 50 ug via INTRAVENOUS

## 2018-05-12 MED ORDER — PRISMASOL BGK 0/2.5 32-2.5 MEQ/L REPLACEMENT SOLN
Status: DC
  Administered 2018-05-12: 1 via INTRAVENOUS_CENTRAL
  Filled 2018-05-12 (×4): qty 5000

## 2018-05-12 MED ORDER — SODIUM CHLORIDE 0.9% FLUSH
10.0000 mL | INTRAVENOUS | Status: DC | PRN
  Administered 2018-05-12: 10 mL
  Filled 2018-05-12: qty 40

## 2018-05-12 MED ORDER — FENTANYL BOLUS VIA INFUSION
50.0000 ug | INTRAVENOUS | Status: DC | PRN
  Administered 2018-05-15: 25 ug via INTRAVENOUS
  Administered 2018-05-15: 50 ug via INTRAVENOUS
  Filled 2018-05-12: qty 50

## 2018-05-12 MED ORDER — MIDAZOLAM HCL 2 MG/2ML IJ SOLN
2.0000 mg | INTRAMUSCULAR | Status: DC | PRN
  Filled 2018-05-12 (×2): qty 2

## 2018-05-12 NOTE — Progress Notes (Addendum)
We responded to this patient after being called by E link due to hypotension and change in mental status.  Patient looked extremely ill hypotensive blood pressure 89/53 with Kussmaul breathing and impending respiratory arrest.  Son at bedside we discussed about her overall prognosis and her current condition and wanted everything to be done including intubation and vasopressors.  ABG showed severe metabolic acidosis with pH of 6.9 PCO2 of 16 and PO2 of 82 Lactic acid 15 with mild hyperkalemia and acute kidney injury.  Patient was also an uric.  Labs chart and radiology were reviewed   Patient looks extremely ill and impending respiratory arrest in distress Chest clear clear some bilateral crackles no wheezing patient has Kussmaul breathing and agonal breathing Neuro exam patient is lethargic trying to roll out of bed not following commands Abdomen soft no tenderness no guarding Extremities cold   Patient was given 4 ampoules of sodium bicarb.  A decision was made to intubate the patient which was done smoothly tolerated the procedure well and a central line will be inserted and an arterial line.  Assessment -Acute hypoxic respiratory failure requiring mechanical ventilation -Cardiogenic shock -Cannot rule out septic shock -Severe pulmonary hypertension RVSP 112 -Severe lactic acidosis/metabolic acidosis -Acute kidney injury -Hyperkalemia -Severe aortic stenosis and moderate mitral stenosis - myelodysplastic syndrome   Plan - I will discontinue aztreonam and start meropenem in addition to vancomycin which she is already on -Repeat lactic acid -Serial BMP -Multiple sodium bicarb pushes -Start sodium bicarb drip -Adjust mechanical ventilation keep pulse ox above 94% -Start Levophed drip and titrate to Keep systolic blood pressure above 120 -Arterial line insertion -Central line insertion -Co ox -Might need CRRT if urine output and acidosis does not improve with sodium bicarb  drip  I have spent 50  minutes of critical care time this time was spent bedside or in the unit this time was exclusive of any billable procedures

## 2018-05-12 NOTE — Progress Notes (Addendum)
Pharmacy Antibiotic Note  Patricia Mata is a 62 y.o. female with Afib/ARF, now hypothermic and hypotensive, possible sepsis.  Pharmacy has been consulted for Vancomycin and Aztreonam dosing.  Plan: Vancomycin 2000 mg IV now Aztreonam 500 mg IV q8h F/U renal function for maintenance vancomycin dosing.  Height: 5\' 4"  (162.6 cm) Weight: 214 lb (97.1 kg) IBW/kg (Calculated) : 54.7  Temp (24hrs), Avg:95.9 F (35.5 C), Min:94.3 F (34.6 C), Max:97.5 F (36.4 C)  Recent Labs  Lab 05/11/18 0650 05/11/18 1818  WBC 5.7 10.0  CREATININE 3.37*  --     Estimated Creatinine Clearance: 19.3 mL/min (A) (by C-G formula based on SCr of 3.37 mg/dL (H)).    Allergies  Allergen Reactions  . Ancef [Cefazolin] Itching    Severe itching- after procedure, ancef was the antibiotic.-04/02/17  . Sulfa Antibiotics Rash and Other (See Comments)    Blisters, also   Caryl Pina 05/12/2018 1:56 AM

## 2018-05-12 NOTE — Progress Notes (Addendum)
RN notified CCM NP regarding critical HgB result of 6.8- Pt currently transfusing 1 unit RBC,  will redraw H&H 2 hours post  blood transfusion completion

## 2018-05-12 NOTE — Procedures (Signed)
Arterial Catheter Insertion Procedure Note Patricia Mata [id number] [birth year]  Procedure: Insertion of Arterial Catheter  Indications: Blood pressure monitoring  Procedure Details Consent: Risks of procedure as well as the alternatives and risks of each were explained to the (patient/caregiver).  Consent for procedure obtained. Time Out: Verified patient identification, verified procedure, site/side was marked, verified correct patient position, special equipment/implants available, medications/allergies/relevent history reviewed, required imaging and test results available.  Performed  Maximum sterile technique was used including antiseptics, cap, gloves, gown, hand hygiene, mask and sheet. Skin prep: Chlorhexidine; local anesthetic administered 20 gauge catheter was inserted into right radial artery using the Seldinger technique. ULTRASOUND GUIDANCE USED: NO Evaluation Blood flow good; BP tracing good. Complications: No apparent complications.   Georgann Housekeeper, AGACNP-BC [place] Pager [phone] or [phone]  05/12/2018 5:43 AM

## 2018-05-12 NOTE — Progress Notes (Signed)
Discussed case with Dr. Angelena Form and Burt Knack of the cardiac structural team. I had asked whether they would recommend emergent balloon valvuloplasty as a bridge to TAVR in this patient who is in shock with critical aortic stenosis. Given her significant renal failure and acidemia and the fact that she is maintaining her blood pressure on single agent pressor, they are not recommending valvuloplasty at this time. If she recovers urine output with CRRT and her shock improves, she may be considered for an inpatient TAVR in the near future.   Pixie Casino, MD, [facility], [place] Director of the [facility]  [facility] Diplomate of the American Board of Clinical Lipidology Attending Cardiologist  Direct Dial: [phone]  Fax: [phone]  Website:  [email or web address]

## 2018-05-12 NOTE — Progress Notes (Signed)
DAILY PROGRESS NOTE   Patient Name: Patricia Mata Date of Encounter: 05/12/2018  Chief Complaint   Intubated, sedated on vent  Patient Profile   Patricia Mata is a 62 y.o. female with a hx of severe AS and mild to mod, MS but same as 05/2017 and severe pulmonary hypertension who is being seen today for the evaluation of a fib RVR at the request of Dr. Tyrone Nine. Admitted to ICU and developed hypotension and hypothermia concerning for sepsis.  Subjective   Multiple events overnight-  Developed hypothermia and hypotension, respiratory distress requiring intubation + art line. Now on levophed, vancomycin and meropenem. Significant acidosis overnight- PH 6.9, up to 7.1 this am. Hemoglobin improved to 7.6 after transfusion, then 7. Creatinine increased from 3.3 to 3.9. CXR suggests assymetric edema vs pneumonia. Venous lactate this am is 15. Urine output  Objective   Vitals:   05/12/18 0730 05/12/18 0745 05/12/18 0800 05/12/18 0815  BP: (!) 139/47 (!) 150/45 (!) 148/46 (!) 143/48  Pulse: (!) 59 61 60 (!) 57  Resp: '17 15 19 18  ' Temp: (!) 95.7 F (35.4 C) (!) 96.1 F (35.6 C) (!) 96.6 F (35.9 C) (!) 97 F (36.1 C)  TempSrc:   Rectal   SpO2: 100% 100% 100% 100%  Weight:      Height:        Intake/Output Summary (Last 24 hours) at 05/12/2018 0827 Last data filed at 05/12/2018 0300 Gross per 24 hour  Intake 3055.04 ml  Output -  Net 3055.04 ml   Filed Weights   05/11/18 0626  Weight: 97.1 kg    Physical Exam   General appearance: intubated, sedated on vent, pupils dilated Neck: no carotid bruit, no JVD and thyroid not enlarged, symmetric, no tenderness/mass/nodules Lungs: diminished breath sounds bilaterally Heart: regular rate and rhythm Abdomen: soft, non-tender; bowel sounds normal; no masses,  no organomegaly Extremities: extremities normal, atraumatic, no cyanosis or edema Pulses: 1+ pulses Skin: warm, dry (on warming blanket) Neurologic: Mental status: intubated,  sedated on vent Psych: Cannot assess  Inpatient Medications    Scheduled Meds: . sodium chloride   Intravenous Once  . insulin aspart  0-15 Units Subcutaneous Q4H  . latanoprost  1 drop Both Eyes QHS  . midazolam  2 mg Intravenous Once  . sodium bicarbonate        Continuous Infusions: . sodium chloride 1,000 mL (05/12/18 0322)  . fentaNYL infusion INTRAVENOUS 400 mcg/hr (05/12/18 0645)  . meropenem (MERREM) IV 500 mg (05/12/18 0655)  . norepinephrine (LEVOPHED) Adult infusion 12 mcg/min (05/12/18 0730)  .  sodium bicarbonate  infusion 1000 mL 100 mL/hr at 05/12/18 0630    PRN Meds: sodium chloride, fentaNYL, midazolam, midazolam   Labs   Results for orders placed or performed during the hospital encounter of 05/11/18 (from the past 48 hour(s))  Basic metabolic panel     Status: Abnormal   Collection Time: 05/11/18  6:50 AM  Result Value Ref Range   Sodium 130 (L) 135 - 145 mmol/L   Potassium 4.4 3.5 - 5.1 mmol/L   Chloride 97 (L) 98 - 111 mmol/L   CO2 8 (L) 22 - 32 mmol/L   Glucose, Bld 164 (H) 70 - 99 mg/dL   BUN 48 (H) 8 - 23 mg/dL   Creatinine, Ser 3.37 (H) 0.44 - 1.00 mg/dL   Calcium 8.8 (L) 8.9 - 10.3 mg/dL   GFR calc non Af Amer 14 (L) >60 mL/min   GFR calc Af Wyvonnia Lora  16 (L) >60 mL/min    Comment: (NOTE) The eGFR has been calculated using the CKD EPI equation. This calculation has not been validated in all clinical situations. eGFR's persistently <60 mL/min signify possible Chronic Kidney Disease.    Anion gap 25 (H) 5 - 15    Comment: Performed at [facility], [place] [address], [place], [place] [address]  CBC with Differential     Status: Abnormal   Collection Time: 05/11/18  6:50 AM  Result Value Ref Range   WBC 5.7 4.0 - 10.5 K/uL   RBC 2.05 (L) 3.87 - 5.11 MIL/uL   Hemoglobin 6.3 (LL) 12.0 - 15.0 g/dL    Comment: This critical result has verified and been called to Bethena Midget, RN by Dawayne Patricia del Angel on 10 28 2019 at 0729, and has been read back.  CRITICAL RESULT VERIFIED, HGB WAS RECHECKED REPEATED TO VERIFY CORRECTED ON 10/28 AT 1407: PREVIOUSLY REPORTED AS 6.3 This critical result has verified and been called to Bethena Midget, RN by Dawayne Patricia del Angel on 10 28 2019 at 0729, and has been read back. CRITICAL RESULT VERIFIED, HGB WAS RECHECKED    HCT 19.9 (L) 36.0 - 46.0 %   MCV 97.1 80.0 - 100.0 fL   MCH 30.7 26.0 - 34.0 pg   MCHC 31.7 30.0 - 36.0 g/dL   RDW 17.5 (H) 11.5 - 15.5 %   Platelets 257 150 - 400 K/uL   nRBC 0.0 0.0 - 0.2 %   Neutrophils Relative % 65 %   Neutro Abs 3.7 1.7 - 7.7 K/uL   Lymphocytes Relative 11 %   Lymphs Abs 0.6 (L) 0.7 - 4.0 K/uL   Monocytes Relative 23 %   Monocytes Absolute 1.3 (H) 0.1 - 1.0 K/uL   Eosinophils Relative 0 %   Eosinophils Absolute 0.0 0.0 - 0.5 K/uL   Basophils Relative 0 %   Basophils Absolute 0.0 0.0 - 0.1 K/uL   Immature Granulocytes 1 %   Abs Immature Granulocytes 0.04 0.00 - 0.07 K/uL    Comment: Performed at [facility] [address] [place], [place] [address]  Protime-INR     Status: Abnormal   Collection Time: 05/11/18  6:50 AM  Result Value Ref Range   Prothrombin Time 20.4 (H) 11.4 - 15.2 seconds   INR 1.78     Comment: Performed at [facility] [address], [place], [place] [address]  Brain natriuretic peptide     Status: Abnormal   Collection Time: 05/11/18  6:50 AM  Result Value Ref Range   B Natriuretic Peptide 2,681.1 (H) 0.0 - 100.0 pg/mL    Comment: Performed at [facility] [address] [place], [place] [address]  Magnesium     Status: Abnormal   Collection Time: 05/11/18  6:50 AM  Result Value Ref Range   Magnesium 1.2 (L) 1.7 - 2.4 mg/dL    Comment: Performed at [facility] [address] [place], [place] [address]  I-stat troponin, ED     Status: None   Collection Time: 05/11/18  6:55 AM  Result Value Ref Range   Troponin i, poc 0.03 0.00 - 0.08 ng/mL   Comment 3            Comment: Due to the release kinetics  of cTnI, a negative result within the first hours of the onset of symptoms does not rule out myocardial infarction with certainty. If myocardial infarction  is still suspected, repeat the test at appropriate intervals.   Type and screen [place]     Status: None (Preliminary result)   Collection Time: 05/11/18  8:30 AM  Result Value Ref Range   ABO/RH(D) A NEG    Antibody Screen NEG    Sample Expiration      05/14/2018 Performed at [facility], [place] [address] [place], [place] [address]    Unit Number [id number]    Blood Component Type RBC, LR IRR    Unit division 00    Status of Unit ISSUED,FINAL    Transfusion Status OK TO TRANSFUSE    Crossmatch Result Compatible    Unit Number [id number]    Blood Component Type RCLI PHER 1    Unit division 00    Status of Unit ISSUED,FINAL    Transfusion Status OK TO TRANSFUSE    Crossmatch Result Compatible    Unit Number [id number]    Blood Component Type RCLI PHER 2    Unit division 00    Status of Unit ALLOCATED    Transfusion Status OK TO TRANSFUSE    Crossmatch Result Compatible    Unit Number [id number]    Blood Component Type RBC, LR IRR    Unit division 00    Status of Unit ALLOCATED    Transfusion Status OK TO TRANSFUSE    Crossmatch Result Compatible   Prepare RBC     Status: None   Collection Time: 05/11/18 10:13 AM  Result Value Ref Range   Order Confirmation      ORDER PROCESSED BY BLOOD BANK Performed at [facility], [place] [address], [place], [place] [address]   CBG monitoring, ED     Status: Abnormal   Collection Time: 05/11/18 11:52 AM  Result Value Ref Range   Glucose-Capillary 100 (H) 70 - 99 mg/dL   Comment 1 Notify RN    Comment 2 Document in Chart   Troponin I     Status: Abnormal   Collection Time: 05/11/18 11:58 AM  Result Value Ref Range   Troponin I 0.12 (HH) <0.03 ng/mL    Comment: CRITICAL RESULT CALLED TO, READ BACK BY AND VERIFIED WITH: K MOON RN  1303 05/11/2018 BY A BENNETT Performed at [facility], [place] [address] [place], [place] [address]   Troponin I     Status: Abnormal   Collection Time: 05/11/18  3:38 PM  Result Value Ref Range   Troponin I 0.28 (HH) <0.03 ng/mL    Comment: CRITICAL VALUE NOTED.  VALUE IS CONSISTENT WITH PREVIOUSLY REPORTED AND CALLED VALUE. Performed at [facility], [place] [address] [place], [place] [address]   Glucose, capillary     Status: Abnormal   Collection Time: 05/11/18  5:59 PM  Result Value Ref Range   Glucose-Capillary 106 (H) 70 - 99 mg/dL  CBC     Status: Abnormal   Collection Time: 05/11/18  6:18 PM  Result Value Ref Range   WBC 10.0 4.0 - 10.5 K/uL   RBC 2.25 (L) 3.87 - 5.11 MIL/uL   Hemoglobin 6.8 (LL) 12.0 - 15.0 g/dL    Comment: REPEATED TO VERIFY THIS CRITICAL RESULT HAS VERIFIED AND BEEN CALLED TO J. YACOPINO RN BY RASHEEDA GREEN ON 10 28 2019 AT 2005, AND HAS BEEN READ BACK. CRITICAL VALUE VERIFIED    HCT 22.2 (L) 36.0 - 46.0 %   MCV 98.7 80.0 - 100.0 fL   MCH  30.2 26.0 - 34.0 pg   MCHC 30.6 30.0 - 36.0 g/dL   RDW 17.7 (H) 11.5 - 15.5 %   Platelets 248 150 - 400 K/uL   nRBC 0.4 (H) 0.0 - 0.2 %    Comment: Performed at [facility] [address], [place], [place] [address]  MRSA PCR Screening     Status: None   Collection Time: 05/11/18  7:04 PM  Result Value Ref Range   MRSA by PCR NEGATIVE NEGATIVE    Comment:        The GeneXpert MRSA Assay (FDA approved for NASAL specimens only), is one component of a comprehensive MRSA colonization surveillance program. It is not intended to diagnose MRSA infection nor to guide or monitor treatment for MRSA infections. Performed at [facility], [place] [address], [place], [place] [address]   Glucose, capillary     Status: Abnormal   Collection Time: 05/11/18  7:45 PM  Result Value Ref Range   Glucose-Capillary 104 (H) 70 - 99 mg/dL   Comment 1 Notify RN   Troponin I     Status: Abnormal    Collection Time: 05/11/18 10:15 PM  Result Value Ref Range   Troponin I 0.27 (HH) <0.03 ng/mL    Comment: CRITICAL VALUE NOTED.  VALUE IS CONSISTENT WITH PREVIOUSLY REPORTED AND CALLED VALUE. Performed at [facility], [place] [address] [place], [place] [address]   Hemoglobin and hematocrit, blood     Status: Abnormal   Collection Time: 05/11/18 10:15 PM  Result Value Ref Range   Hemoglobin 7.6 (L) 12.0 - 15.0 g/dL   HCT 24.6 (L) 36.0 - 46.0 %    Comment: Performed at [facility] [address] [place], [place] [address]  Glucose, capillary     Status: Abnormal   Collection Time: 05/11/18 11:35 PM  Result Value Ref Range   Glucose-Capillary 116 (H) 70 - 99 mg/dL   Comment 1 Notify RN   Lactic acid, plasma     Status: Abnormal   Collection Time: 05/12/18  2:52 AM  Result Value Ref Range   Lactic Acid, Venous 15.6 (HH) 0.5 - 1.9 mmol/L    Comment: RESULTS CONFIRMED BY MANUAL DILUTION CRITICAL RESULT CALLED TO, READ BACK BY AND VERIFIED WITH: Angus Palms J,RN 05/12/18 0357 WAYK Performed at [facility], [place] [address] [place], [place] [address]   Basic metabolic panel     Status: Abnormal   Collection Time: 05/12/18  3:00 AM  Result Value Ref Range   Sodium 133 (L) 135 - 145 mmol/L   Potassium 5.4 (H) 3.5 - 5.1 mmol/L   Chloride 100 98 - 111 mmol/L   CO2 <7 (L) 22 - 32 mmol/L   Glucose, Bld 114 (H) 70 - 99 mg/dL   BUN 58 (H) 8 - 23 mg/dL   Creatinine, Ser 3.94 (H) 0.44 - 1.00 mg/dL   Calcium 8.4 (L) 8.9 - 10.3 mg/dL   GFR calc non Af Amer 11 (L) >60 mL/min   GFR calc Af Amer 13 (L) >60 mL/min    Comment: (NOTE) The eGFR has been calculated using the CKD EPI equation. This calculation has not been validated in all clinical situations. eGFR's persistently <60 mL/min signify possible Chronic Kidney Disease. Performed at [facility], [place] [address] [place],  [address]   Magnesium     Status: None   Collection Time: 05/12/18  3:00 AM  Result  Value Ref  Range   Magnesium 2.3 1.7 - 2.4 mg/dL    Comment: Performed at [facility] [address], [place], [place] [address]  Phosphorus     Status: Abnormal   Collection Time: 05/12/18  3:00 AM  Result Value Ref Range   Phosphorus 10.1 (H) 2.5 - 4.6 mg/dL    Comment: Performed at [facility] [address], [place], [place] [address]  CBC     Status: Abnormal   Collection Time: 05/12/18  3:00 AM  Result Value Ref Range   WBC 13.2 (H) 4.0 - 10.5 K/uL   RBC 2.33 (L) 3.87 - 5.11 MIL/uL   Hemoglobin 7.0 (L) 12.0 - 15.0 g/dL   HCT 23.4 (L) 36.0 - 46.0 %   MCV 100.4 (H) 80.0 - 100.0 fL   MCH 30.0 26.0 - 34.0 pg   MCHC 29.9 (L) 30.0 - 36.0 g/dL   RDW 18.2 (H) 11.5 - 15.5 %   Platelets 213 150 - 400 K/uL   nRBC 0.5 (H) 0.0 - 0.2 %    Comment: Performed at [facility], [place] [address] [place], [place] [address]  Glucose, capillary     Status: Abnormal   Collection Time: 05/12/18  4:04 AM  Result Value Ref Range   Glucose-Capillary 104 (H) 70 - 99 mg/dL   Comment 1 Notify RN   Prepare RBC     Status: None   Collection Time: 05/12/18  4:14 AM  Result Value Ref Range   Order Confirmation      ORDER PROCESSED BY BLOOD BANK Performed at [facility], [place] [address], [place], [place] [address]   I-STAT 3, arterial blood gas (G3+)     Status: Abnormal   Collection Time: 05/12/18  4:25 AM  Result Value Ref Range   pH, Arterial 6.928 (LL) 7.350 - 7.450   pCO2 arterial 16.9 (LL) 32.0 - 48.0 mmHg   pO2, Arterial 82.0 (L) 83.0 - 108.0 mmHg   Bicarbonate 3.7 (L) 20.0 - 28.0 mmol/L   TCO2 <5 (L) 22 - 32 mmol/L   O2 Saturation 89.0 %   Acid-base deficit 27.0 (H) 0.0 - 2.0 mmol/L   Patient temperature 35.0 C    Collection site BRACHIAL ARTERY    Drawn by Operator    Sample type ARTERIAL    Comment NOTIFIED PHYSICIAN   Lactic acid, plasma     Status: Abnormal   Collection Time: 05/12/18  6:12 AM  Result Value Ref Range   Lactic Acid, Venous 13.4 (HH) 0.5 -  1.9 mmol/L    Comment: RESULTS CONFIRMED BY MANUAL DILUTION CRITICAL RESULT CALLED TO, READ BACK BY AND VERIFIED WITHDereck Ligas RN @ [phone] 05/12/18 LEONARD,A Performed at [facility], [address] [place],  [address]   I-STAT 3, arterial blood gas (G3+)     Status: Abnormal   Collection Time: 05/12/18  6:15 AM  Result Value Ref Range   pH, Arterial 7.153 (LL) 7.350 - 7.450   pCO2 arterial 18.8 (LL) 32.0 - 48.0 mmHg   pO2, Arterial 469.0 (H) 83.0 - 108.0 mmHg   Bicarbonate 6.8 (L) 20.0 - 28.0 mmol/L   TCO2 7 (L) 22 - 32 mmol/L   O2 Saturation 100.0 %   Acid-base deficit 21.0 (H) 0.0 - 2.0 mmol/L   Patient temperature 35.2 C    Collection site RADIAL, ALLEN'S TEST ACCEPTABLE    Drawn by RT    Sample type ARTERIAL  Comment NOTIFIED PHYSICIAN   Glucose, capillary     Status: Abnormal   Collection Time: 05/12/18  7:37 AM  Result Value Ref Range   Glucose-Capillary 106 (H) 70 - 99 mg/dL   Comment 1 Capillary Specimen     ECG   Sinus bradycardia at 57, prolonged QTc at 644 msec - Personally Reviewed  Telemetry   Sinus rhythm in 60's - Personally Reviewed  Radiology    Dg Chest Port 1 View  Result Date: 05/12/2018 CLINICAL DATA:  62 y/o  F; code. Enteric tube placement. EXAM: PORTABLE CHEST 1 VIEW COMPARISON:  05/12/2018 chest radiograph FINDINGS: Endotracheal tube tip projects 4.2 cm above the carina. Interval placement of right central venous catheter with tip projecting over mid SVC. Stable right port catheter position projecting over right mid SVC. Stable ill-defined opacities within left mid and lower lung zones. Stable enlarged cardiac silhouette. Aortic calcific atherosclerosis. IMPRESSION: 1. Endotracheal tube tip projects 4.2 cm above carina. 2. Right central venous catheter tip projects over mid SVC. 3. Stable left mid and lower lung zone ill-defined opacities. Electronically Signed   By: Kristine Garbe M.D.   On: 05/12/2018 06:26   Dg Chest  Port 1 View  Result Date: 05/12/2018 CLINICAL DATA:  62 y/o F; shortness of breath, hypothermia, hypotension. EXAM: PORTABLE CHEST 1 VIEW COMPARISON:  05/11/2018 chest radiograph FINDINGS: Stable cardiomegaly given projection and technique. Right central venous catheter tip projects over mid SVC and is stable. Aortic atherosclerosis with calcification. Ill-defined opacities within left mid and lower lung zone. No pleural effusion or pneumothorax. No acute osseous abnormality is evident. IMPRESSION: Ill-defined opacities within left mid and lower lung zone may represent pneumonia or asymmetric edema. Stable cardiomegaly. Electronically Signed   By: Kristine Garbe M.D.   On: 05/12/2018 02:34   Dg Chest Port 1 View  Result Date: 05/11/2018 CLINICAL DATA:  Worsening shortness of breath and palpitation. Productive cough. Dyspnea with exertion. History of colon cancer. EXAM: PORTABLE CHEST 1 VIEW COMPARISON:  06/14/2017; 06/10/2017; chest CT-03/26/2011 FINDINGS: Grossly unchanged enlarged cardiac silhouette and mediastinal contours with atherosclerotic plaque within the thoracic aorta. Re-demonstrated nodular prominence of the pulmonary hila similar to remote prior examinations and favored to represent prominence of central pulmonary vasculature. Stable positioning of support apparatus. Overall improved aeration of the lungs. No discrete focal airspace opacities. No evidence of edema. No pleural effusion or pneumothorax. No acute osseus abnormalities. Several loose bodies overlie the medial aspect of the right glenohumeral joint likely the sequela of remote avulsive injury, incompletely evaluated. IMPRESSION: Similar findings of cardiomegaly without superimposed acute cardiopulmonary disease. Electronically Signed   By: Sandi Mariscal M.D.   On: 05/11/2018 07:10   Dg Abd Portable 1v  Result Date: 05/12/2018 CLINICAL DATA:  62 y/o  F; orogastric tube placement. EXAM: PORTABLE ABDOMEN - 1 VIEW  COMPARISON:  05/08/2017 CT abdomen and pelvis FINDINGS: Enteric tube tip projects over gastric body. Normal bowel gas pattern. No radiopaque urinary stone disease identified. Lower lumbar spine degenerative changes. IMPRESSION: Enteric tube tip projects over gastric body. Electronically Signed   By: Kristine Garbe M.D.   On: 05/12/2018 06:25    Cardiac Studies   N/A  Assessment   Active Problems:   Critical aortic valve stenosis   A-fib (HCC)   Acute kidney injury (nontraumatic) (HCC)   Lactic acidosis   Acute hypoxemic respiratory failure (HCC)   Cardiogenic shock (HCC)   Plan   1. Shock - combination of likely sepsis and poor cardiac  output due to fixed afterload of aortic stenosis. Agree with levophed for pressor support. BCx pending. On broad spectrum antibiotics - given long QTc, avoid QT prolonging drugs (ok for vancomycin and meropenem). 2. Critical aortic stenosis - management options are limited at this point. If we are not able to maintain hemodynamics, could consider urgent temporary balloon valvuloplasty, but that would only be if patient continues to decline with reasonable chance to recover and ultimately receive TAVR. 3. PAF - slowed on diltiazem yesterday and converted to sinus brady yesterday afternoon. Stable in a sinus bradycardia at this point. High likelihood of recurrent afib - would use amiodarone if necessary. 4. Oliguric/anuric renal failure - rising creatinine with little urine output with significant metabolic acidosis. Suspect she will need CRRT soon. 5. ?Sepsis - unclear of source of infection or if metabolic acidosis due to renal failure, presumed secondary to low cardiac output, ?ATN, etc ..  CRITICAL CARE TIME: I have spent a total of 45 minutes with patient reviewing hospital notes, telemetry, EKGs, labs and examining the patient as well as establishing an assessment and plan that was discussed with the patient. > 50% of time was spent in direct  patient care. The patient is critically ill with multi-organ system failure and requires high complexity decision making for assessment and support, frequent evaluation and titration of therapies, application of advanced monitoring technologies and extensive interpretation of multiple databases.  Length of Stay:  LOS: 1 day   Pixie Casino, MD, [facility], [place] Director of the [facility] Lipid Disorders &  Cardiovascular Risk Reduction Clinic Diplomate of the American Board of Clinical Lipidology Attending Cardiologist  Direct Dial: [phone]  Fax: [phone]  Website:  [email or web address] [email or web address] 05/12/2018, 8:27 AM

## 2018-05-12 NOTE — Progress Notes (Addendum)
Pt intubated at 0440 Pt received 25 mg etomidate, 2 mg versed, 4 bicarb- per CCM order (200 meq)   -Pt received additional 4 amps bicarb at 0645 (200 meq)

## 2018-05-12 NOTE — Telephone Encounter (Signed)
Called patient per 10/28 sch message - to r/s appt - left message - per Dr. Alen Blew - no need to r/s keep 11/8 appt as is .

## 2018-05-12 NOTE — Progress Notes (Signed)
  Echocardiogram 2D Echocardiogram has been performed.  Patricia Mata 05/12/2018, 4:12 PM

## 2018-05-12 NOTE — Progress Notes (Signed)
Pharmacy Antibiotic Note  Patricia Mata is a 62 y.o. female with severe AS and cardiogenic shock on meropenem and vancomycin for empiric coverage. CRRT started today -WBC= 13.2, LA= 13, SCr= 3.94 (~ 1.0 earlier this month), CrCl ~ 15   Plan: -change meropenem to 1gm IV q12h -Vancomycin 1000 mg IV q24hr -Will follow renal function, cultures and clinical progress   Height: 5\' 4"  (162.6 cm) Weight: (S) 223 lb 1.7 oz (101.2 kg)(pre CRRT, 1 pillow, pad, top sheet and bedspread) IBW/kg (Calculated) : 54.7  Temp (24hrs), Avg:96.8 F (36 C), Min:94.3 F (34.6 C), Max:99.7 F (37.6 C)  Recent Labs  Lab 05/11/18 0650 05/11/18 1818 05/12/18 0252 05/12/18 0300 05/12/18 0612 05/12/18 1012  WBC 5.7 10.0  --  13.2*  --   --   CREATININE 3.37*  --   --  3.94*  --   --   LATICACIDVEN  --   --  15.6*  --  13.4* 17.7*    Estimated Creatinine Clearance: 16.9 mL/min (A) (by C-G formula based on SCr of 3.94 mg/dL (H)).    Allergies  Allergen Reactions  . Ancef [Cefazolin] Itching    Severe itching- after procedure, ancef was the antibiotic.-04/02/17  . Sulfa Antibiotics Rash and Other (See Comments)    Blisters, also    Antimicrobials this admission: 10/21 vanc 10/21 meropenem  Dose adjustments this admission:   Microbiology results: 10/29 blood x2 10/29 MRSA PCR- neg  Thank you for allowing pharmacy to be a part of this patient's care.  Hildred Laser, PharmD Clinical Pharmacist Please check Amion for pharmacy contact number

## 2018-05-12 NOTE — Procedures (Signed)
Intubation Procedure Note Patricia Mata [id number] [birth year]  Procedure: Intubation Indications: Respiratory insufficiency  Procedure Details Consent: Risks of procedure as well as the alternatives and risks of each were explained to the (patient/caregiver).  Consent for procedure obtained. Time Out: Verified patient identification, verified procedure, site/side was marked, verified correct patient position, special equipment/implants available, medications/allergies/relevent history reviewed, required imaging and test results available.  Performed  Maximum clean technique was used including gloves, hand hygiene and mask.  MAC and 3 Glidescope  Medications:  Etomidate 97m  Grade 1 airway view  Evaluation Hemodynamic Status: Transient hypotension treated with pressors; O2 sats: stable throughout Patient's Current Condition: stable Complications: No apparent complications Patient did tolerate procedure well. Chest X-ray ordered to verify placement.  CXR: pending.    PGeorgann Housekeeper AGACNP-BC LLinntownPager [phone] [phone] 05/12/2018 5:44 AM

## 2018-05-12 NOTE — Progress Notes (Signed)
ABG drawn, called E-link spoke to Dr. Emmit Alexanders with results.

## 2018-05-12 NOTE — Procedures (Signed)
Central Venous Catheter Insertion Procedure Note Patricia Mata [id number] 02/10/1956  Procedure: Insertion of Central Venous Catheter Indications: Assessment of intravascular volume, Drug and/or fluid administration and Frequent blood sampling  Procedure Details Consent: Risks of procedure as well as the alternatives and risks of each were explained to the (patient/caregiver).  Consent for procedure obtained. Time Out: Verified patient identification, verified procedure, site/side was marked, verified correct patient position, special equipment/implants available, medications/allergies/relevent history reviewed, required imaging and test results available.  Performed  Maximum sterile technique was used including antiseptics, cap, gloves, gown, hand hygiene, mask and sheet. Skin prep: Chlorhexidine; local anesthetic administered A antimicrobial bonded/coated triple lumen catheter was placed in the right internal jugular vein using the Seldinger technique. Ultrasound guidance used.Yes.   Catheter placed to 16 cm. Blood aspirated via all 3 ports and then flushed x 3. Line sutured x 2 and dressing applied.  Evaluation Blood flow good Complications: No apparent complications Patient did tolerate procedure well. Chest X-ray ordered to verify placement.  CXR: pending.  Georgann Housekeeper, AGACNP-BC [place] Pager [phone] or [phone]  05/12/2018 5:42 AM

## 2018-05-12 NOTE — Progress Notes (Signed)
NAME:  Patricia Mata, MRN:  [id number], DOB:  18-Sep-1955, LOS: 1 ADMISSION DATE:  05/11/2018, CONSULTATION DATE:  10/28 REFERRING MD:  EDP, CHIEF COMPLAINT:  AFib RVR    Brief History   62yo female with hx severe AS, severe TR, severe pulmonary HTN, stage 3 colon cancer and myelodysplastic syndrome and anemia requiring frequent transfusions presented 10/28 with dyspnea found to have AFib with RVR, acute on chronic anemia and AKI.  Cardiology requesting ICU admission per PCCM.   Past Medical History  Severe left ear Severe TR Severe pulmonary hypertension Stage III colon cancer Myelodysplastic syndrome-diagnosed 2017 Chronic microcytic anemia Hypertension Diabetes Significant Hospital Events   10/29 early am  Intubated emergently>> suspected sepsis>>severe  metabolic acidosis  Consults: date of consult/date signed off & final recs:  Cardiology 10/28  Procedures (surgical and bedside):  10/29>> ETT>> 10/29>> RIJ CVC>> 10/29>> A Line 10/29>> LIJ HD cath  Significant Diagnostic Tests:  10/25 CT abdomen and pelvis Heterogeneous hepatic parenchyma which is nonspecific however may potentially be secondary to hepatic venous congestion. Small amount of perihepatic ascites and free fluid in the pelvis. Anasarca   Micro Data:  10/29>> Blood 10/29>> Urine 10/29>> Sputum  Antimicrobials:  10/29 Merrem 10/29 Vanc  Subjective:  Unable, intubated and unresponsive Multiple events overnight-  Developed hypothermia and hypotension, respiratory distress requiring intubation + art line. Now on levophed, vancomycin and meropenem. Significant acidosis overnight- PH 6.9, up to 7.2 this am. Hemoglobin improved to 7.6 after transfusion, then 7. Creatinine increased from 3.3 to 3.9. CXR suggests assymetric edema vs pneumonia. Venous lactate this am is 15. No Urine output  Objective   Blood pressure (!) 136/41, pulse 62, temperature (!) 97.5 F (36.4 C), resp. rate (!) 39, height 5\' 4"  (1.626  m), weight 97.1 kg, SpO2 100 %. CVP:  [21 mmHg-50 mmHg] 50 mmHg  Vent Mode: PRVC FiO2 (%):  [50 %-100 %] 50 % Set Rate:  [30 bmp] 30 bmp Vt Set:  [490 mL] 490 mL PEEP:  [5 Young Pressure:  [20 cmH20] 20 cmH20   Intake/Output Summary (Last 24 hours) at 05/12/2018 0908 Last data filed at 05/12/2018 0800 Gross per 24 hour  Intake 4081.58 ml  Output -  Net 4081.58 ml   Filed Weights   05/11/18 0626  Weight: 97.1 kg    Examination: General: Sedated and intubated, on pressors, pupils dialted HENT: mm moist, no JVD, no LAD, ETT, OG tube Lungs: Bilateral chest excursion, BS coarse throughout, diminished bilaterally Cardiovascular: s1s2 irreg, 3/6 murmur Abdomen: round, soft, non tender, ND, Obese  Extremities: Warm to touch,, scant BLE edema, otherwise no obvious deformities Neuro: Sedated, intubated, unresponsive  Resolved Hospital Problem list     Assessment & Plan:  A. fib with RVR>> rate now controlled and in Sinus Rhythm- SB Plan- Cardizem gtt is off If recurs, consider amio Holding on heparin given significant anemia Cardiology following Holding home metoprolol  EKG prn  Severe aortic stenosis Severe TR Severe pulmonary hypertension - PA peak pressure >147mmHg Hx HTN Plan- Cardiology following Unclear if candidate for TAVR/ balloon Valvuloplasty-would need right and left cath which cannot be done at this time related to AKI and severe acidosis, hemodynamic instability Follow-up CMET Trend and Follow up Lactate Consider heart failure team involvement Echo  Cardiogenic shock likely sepsis and poor CO Plan: Levophed for MAR > 65 long QTc, avoid QT prolonging drugs (ok for vancomycin and meropenem). Blood Cultures and source control  Severe metabolic acidosis 2/2  renal failure vs low CO/ ATN Plan Treat underlying cause Bicarb gtt at 100 cc per hour Trend ABG q 6 until resolved Trend serum chemistry    AKI Anuric Creatinine increased  from 3.3 to 3.9.  Plan- Follow-up chemistry Avoid nephrotoxic agents Renal US to rule out obstruction Place HD cath Renal consult Most likely will need CVVHD   Acute on chronic anemia Myelodysplastic syndrome Plan- 2 units PRBC Now receiving 3rd unit Lasix  between units if hemodynamically stable Goal hemoglobin greater than 8 Follow-up CBC every 12 hours Holding heparin for now as above  Hypomagnesemia Hyperkalemia Plan- Mg replacement given in ER Follow-up chemistries HD for RF  History of diabetes CBG's stable at present Plan- CBG's  Sliding scale insulin  Disposition / Summary of Today's Plan 05/12/18   We will admit to ICU per cardiology request Transfuse 2 units PRBC Continue Cardizem drip for heart rate control     Diet: N.PO. for now>> will start TF 10/29 Pain/Anxiety/Delirium protocol (if indicated): Fentanyl gtt VAP protocol (if indicated): N/A DVT prophylaxis: SCDs, heparin contraindicated GI prophylaxis: N/A Hyperglycemia protocol: SSI Mobility: Bedrest Code Status: Full code Family Communication: discussed at length with husband and son at bedside in ER 10/28  Labs   CBC: Recent Labs  Lab 05/11/18 0650 05/11/18 1818 05/11/18 2215 05/12/18 0300  WBC 5.7 10.0  --  13.2*  NEUTROABS 3.7  --   --   --   HGB 6.3* 6.8* 7.6* 7.0*  HCT 19.9* 22.2* 24.6* 23.4*  MCV 97.1 98.7  --  100.4*  PLT 257 248  --  638    Basic Metabolic Panel: Recent Labs  Lab 05/11/18 0650 05/12/18 0300  NA 130* 133*  K 4.4 5.4*  CL 97* 100  CO2 8* <7*  GLUCOSE 164* 114*  BUN 48* 58*  CREATININE 3.37* 3.94*  CALCIUM 8.8* 8.4*  MG 1.2* 2.3  PHOS  --  10.1*   GFR: Estimated Creatinine Clearance: 16.5 mL/min (A) (by C-G formula based on SCr of 3.94 mg/dL (H)). Recent Labs  Lab 05/11/18 0650 05/11/18 1818 05/12/18 0252 05/12/18 0300 05/12/18 0612  WBC 5.7 10.0  --  13.2*  --   LATICACIDVEN  --   --  15.6*  --  13.4*    Liver Function Tests: No  results for input(s): AST, ALT, ALKPHOS, BILITOT, PROT, ALBUMIN in the last 168 hours. No results for input(s): LIPASE, AMYLASE in the last 168 hours. No results for input(s): AMMONIA in the last 168 hours.  ABG    Component Value Date/Time   PHART 7.223 (L) 05/12/2018 0851   PCO2ART 18.5 (LL) 05/12/2018 0851   PO2ART 240.0 (H) 05/12/2018 0851   HCO3 7.7 (L) 05/12/2018 0851   TCO2 8 (L) 05/12/2018 0851   ACIDBASEDEF 18.0 (H) 05/12/2018 0851   O2SAT 100.0 05/12/2018 0851     Coagulation Profile: Recent Labs  Lab 05/11/18 0650  INR 1.78    Cardiac Enzymes: Recent Labs  Lab 05/11/18 1158 05/11/18 1538 05/11/18 2215  TROPONINI 0.12* 0.28* 0.27*    HbA1C: Hgb A1c MFr Bld  Date/Time Value Ref Range Status  06/14/2017 01:11 PM 11.6 (H) 4.8 - 5.6 % Final    Comment:    (NOTE)         Prediabetes: 5.7 - 6.4         Diabetes: >6.4         Glycemic control for adults with diabetes: <7.0     CBG: Recent Labs  Lab 05/11/18 1945 05/11/18 2335 05/12/18 0404 05/12/18 0737 05/12/18 0835  GLUCAP 104* 116* 104* 106* 124*    Admitting History of Present Illness.   62 year old female with history of stage III colon cancer, myelodysplastic syndrome, chronic macrocytic anemia requiring frequent transfusions, diabetes, hypertension, severe AS, severe TR, severe pulmonary hypertension with recent echocardiogram showing RVSP 112 mmHg, dilated IVC, severe left atrial enlargement, severe TR, severe aortic stenosis.  She presented 10/28 with several weeks of progressive dyspnea, much worse over the last 3 days and acutely worse on the morning of admission.  In ER she was found to have BNP 2681, hemoglobin 6.3, A. fib with RVR, serum creatinine of 3.37 which is up from 1.05 approximately 2 weeks ago.  Started on Cardizem drip with good control of heart rate. She decompensated overnight developed hypothermia and hypotension, respiratory distress requiring intubation + art line. Now on  levophed, vancomycin and meropenem. CXR with edema and infiltrates, Significant acidosis overnight- PH 6.9, up to 7.2 this am. Hemoglobin improved to 7.6 after transfusion, then 7. Received 3 units PRBC's thus far.  Creatinine increased from 3.3 to 3.9. CXR suggests assymetric edema vs pneumonia. Venous lactate this am is 15. No Urine output. Renal consulted and plan for HD cath insertion.    Past Medical History  She,  has a past medical history of Cancer ([place]), Diabetes mellitus without complication ([place]), Hypertension, and Macrocytic anemia.   Surgical History    Past Surgical History:  Procedure Laterality Date  . COLON SURGERY    . IR FLUORO GUIDE PORT INSERTION RIGHT  04/02/2017  . IR US GUIDE VASC ACCESS RIGHT  04/02/2017     Social History   Social History   Socioeconomic History  . Marital status: Married    Spouse name: Not on file  . Number of children: Not on file  . Years of education: Not on file  . Highest education level: Not on file  Occupational History  . Not on file  Social Needs  . Financial resource strain: Not on file  . Food insecurity:    Worry: Not on file    Inability: Not on file  . Transportation needs:    Medical: Not on file    Non-medical: Not on file  Tobacco Use  . Smoking status: Former Smoker    Types: Cigarettes    Last attempt to quit: 01/31/2001    Years since quitting: 17.2  . Smokeless tobacco: Former Network engineer and Sexual Activity  . Alcohol use: Yes    Alcohol/week: 2.0 standard drinks    Types: 2 Shots of liquor per week  . Drug use: Never  . Sexual activity: Not on file  Lifestyle  . Physical activity:    Days per week: Not on file    Minutes per session: Not on file  . Stress: Not on file  Relationships  . Social connections:    Talks on phone: Not on file    Gets together: Not on file    Attends religious service: Not on file    Active member of club or organization: Not on file    Attends meetings of clubs or  organizations: Not on file    Relationship status: Not on file  . Intimate partner violence:    Fear of current or ex partner: Not on file    Emotionally abused: Not on file    Physically abused: Not on file    Forced sexual activity: Not on file  Other Topics Concern  . Not on file  Social History Narrative  . Not on file  ,  reports that she quit smoking about 17 years ago. Her smoking use included cigarettes. She has quit using smokeless tobacco. She reports that she drinks about 2.0 standard drinks of alcohol per week. She reports that she does not use drugs.   Family History   Her family history includes HIV/AIDS in her brother; Heart attack in her father; Hypertension in her brother, mother, sister, sister, and sister; Prostate cancer in her brother.   Allergies Allergies  Allergen Reactions  . Ancef [Cefazolin] Itching    Severe itching- after procedure, ancef was the antibiotic.-04/02/17  . Sulfa Antibiotics Rash and Other (See Comments)    Blisters, also     Home Medications  Prior to Admission medications   Medication Sig Start Date End Date Taking? Authorizing Provider  atorvastatin (LIPITOR) 40 MG tablet Take 1 tablet (40 mg total) by mouth daily. 04/23/18  Yes [name], FNP  Azilsartan-Chlorthalidone (EDARBYCLOR) 40-12.5 MG TABS Take 1 tablet by mouth daily.    Yes [provider]  colchicine 0.6 MG tablet Take 0.6 mg by mouth 2 (two) times daily as needed (gout flare).  11/02/15  Yes [provider]  furosemide (LASIX) 20 MG tablet TAKE 1 TABLET BY MOUTH TWICE A DAY 03/19/18  Yes Shadad, Mathis Dad, MD  hydrALAZINE (APRESOLINE) 25 MG tablet Take 2 tablets (50 mg total) by mouth 3 (three) times daily. 02/19/18  Yes Lorretta Harp, MD  insulin glargine (LANTUS) 100 unit/mL SOPN Inject 0.14 mLs (14 Units total) into the skin daily after breakfast. Patient taking differently: Inject 10 Units into the skin daily after breakfast.  06/16/17  Yes Bonnielee Haff, MD  JANUMET XR 50-500 MG TB24 Take 1 tablet by mouth daily. With evening meal 02/07/18  Yes [provider]  latanoprost (XALATAN) 0.005 % ophthalmic solution Place 1 drop into both eyes at bedtime.    Yes [provider]  lidocaine-prilocaine (EMLA) cream Apply a quarter size of cream to port 1-2 hours prior to access. Cover with saran wrap. 04/17/17  Yes [name], MD  megestrol (MEGACE) 400 MG/10ML suspension Take 10 mLs (400 mg total) by mouth 2 (two) times daily. 03/06/18  Yes [name], MD  metoprolol tartrate (LOPRESSOR) 50 MG tablet TAKE 1 TABLET BY MOUTH TWICE A DAY WITH FOOD 05/04/18  Yes [name], FNP  Multiple Vitamins-Minerals (CENTRUM SILVER 50+WOMEN) TABS Take 1 tablet by mouth daily.   Yes [provider]  prochlorperazine (COMPAZINE) 10 MG tablet Take 1 tablet (10 mg total) by mouth every 6 (six) hours as needed for nausea or vomiting. 04/28/17  Yes [name], MD  gabapentin (NEURONTIN) 100 MG capsule Take 2 capsules (200 mg total) by mouth 3 (three) times daily. Patient not taking: Reported on 05/11/2018 10/28/17   Harle Stanford., PA-C  Insulin Pen Needle 29G X 5MM MISC Use as directed 06/16/17   Bonnielee Haff, MD  oxyCODONE-acetaminophen (PERCOCET/ROXICET) 5-325 MG tablet Take 1-2 tablets by mouth every 4 (four) hours as needed for severe pain. Patient not taking: Reported on 05/11/2018 10/27/17   Harle Stanford., PA-C  predniSONE (DELTASONE) 5 MG tablet 6 tab x 1 day, 5 tab x 1 day, 4 tab x 1 day, 3 tab x 1 day, 2 tab x 1 day, 1 tab x 1 day, stop Patient not taking: Reported on 05/11/2018 10/28/17   Tanner,  Lyndon Code., PA-C     Critical care time: 45 minutes    Dr. Elsworth Soho spent 10-15 minutes with family discussing the patient's illnesses and prognosis at length. Decision was made to place HD cath, and consult renal.    Magdalen Spatz, AGACNP-BC [place] Pager # [phone] after 3pm call   [phone] 05/12/2018 9:09 AM

## 2018-05-12 NOTE — Procedures (Signed)
Hemodialysis Insertion Procedure Note Patricia Mata [id number] [birth year]  Procedure: Insertion of Hemodialysis Catheter Type: 3 port  Indications: Hemodialysis   Procedure Details Consent: Risks of procedure as well as the alternatives and risks of each were explained to the (patient/caregiver).  Consent for procedure obtained. Time Out: Verified patient identification, verified procedure, site/side was marked, verified correct patient position, special equipment/implants available, medications/allergies/relevent history reviewed, required imaging and test results available.  Performed  Maximum sterile technique was used including antiseptics, cap, gloves, gown, hand hygiene, mask and sheet. Skin prep: Chlorhexidine; local anesthetic administered A antimicrobial bonded/coated triple lumen catheter was placed in the left internal jugular vein using the Seldinger technique. Ultrasound guidance used.Yes.   Catheter placed to 20 cm. Blood aspirated via all 3 ports and then flushed x 3. Line sutured x 2 and dressing applied.  Evaluation Blood flow good Complications: No apparent complications Patient did tolerate procedure well. Chest X-ray ordered to verify placement.  CXR: pending.    Left IJ note wire. [name] [name] ACNP [name] PCCM Pager [phone] till 1 pm If no answer page [phone] 05/12/2018, 11:20 AM

## 2018-05-12 NOTE — Progress Notes (Signed)
eLink Physician-Brief Progress Note Patient Name: Syann Cupples DOB: 1956-03-13 MRN: [id number]   Date of Service  05/12/2018  HPI/Events of Note  Request to review CXR and KUB films for CVL and gastric tube placement. R IJ CVL tip in mid SVC. No pneumothorax. Gastric tube tip in mid stomach.   eICU Interventions  OK to use both R IJ CVL and gastric tube.      Intervention Category Intermediate Interventions: Diagnostic test evaluation  Lyvia Mondesir [place] 05/12/2018, 6:03 AM

## 2018-05-12 NOTE — Consult Note (Addendum)
Reason for Consult:Acute Kidney Injury Referring Physician: Eric Form  Chief Complaint: Afib w/ RVR  Assessment/Plan: 1. Acute Kidney Injury from hypoperfusion + sepsis and currently essentially anuric. Patient almost certainly in ATN and may be in for a protracted course if she recovers. Anuria is not a good sign and her cardiac status is concerning as well with the severe AS and pulm HTN. - Agree with initiating CRRT with initially no UF given she's on pressors -> will re-assess later today and in the AM to determine if we can gently UF her. - Dose meds for CRRT; phos should slowly decrease with RRT. - 0K bath initially but will most likely need to be raised to 2K soon.  Seen on CRRT at 350pm 0k bath through [facility] temp cath Pre/post/qd 500/300/1500 Net UF = 0 No changes for now  2. Afib RVR - rate controlled now off Cardizem 3. Severe AS and pulm HTN - not stable for more definitive treatments. 4. H/o MDS and colon CA 5. DM    HPI: Caelyn Route is an 62 y.o. female with history of stage III colon cancer, MDS dx 2017,  chronic macrocytic anemia requiring frequent transfusions, DM, HTN, severe AS, severe TR, severe pulmonary hypertension with recent echocardiogram showing RVSP 112 mmHg. She presented with several weeks of progressive dyspnea, worsening over the past few days and was found to have AKI and A. fib with RVR in the ED. Her cr had risen from 1.05 to 3.37 and was rate controlled with Cardizem but eventually req intubation as a result of likely sepsis + metabolic acidosis with a pH of 6.93 + hyperkalemia (K5.4). She also had hypotension req Levophed w/ CXR showing either edema vs PNA. She has had no UOP since admission.  ROS Pertinent items are noted in HPI.  Chemistry and CBC: Creatinine  Date/Time Value Ref Range Status  04/24/2018 03:45 PM 1.05 (H) 0.44 - 1.00 mg/dL Final  04/16/2018 09:21 AM 0.94 0.44 - 1.00 mg/dL Final  04/06/2018 10:08 AM 1.04 (H) 0.44 - 1.00 mg/dL  Final  03/27/2018 10:34 AM 2.09 (H) 0.44 - 1.00 mg/dL Final  03/06/2018 09:00 AM 1.50 (H) 0.44 - 1.00 mg/dL Final  01/30/2018 09:17 AM 1.20 (H) 0.44 - 1.00 mg/dL Final  01/14/2018 01:15 PM 1.13 (H) 0.44 - 1.00 mg/dL Final  05/26/2017 09:49 AM 1.4 (H) 0.6 - 1.1 mg/dL Final  05/09/2017 09:41 AM 1.7 (H) 0.6 - 1.1 mg/dL Final  04/28/2017 08:37 AM 1.5 (H) 0.6 - 1.1 mg/dL Final  04/17/2017 08:08 AM 1.7 (H) 0.6 - 1.1 mg/dL Final  03/08/2016 09:09 AM 1.1 0.6 - 1.1 mg/dL Final  11/29/2015 11:16 AM 1.1 0.6 - 1.1 mg/dL Final  03/31/2012 03:13 PM 1.4 (H) 0.6 - 1.1 mg/dL Final   Creat  Date/Time Value Ref Range Status  03/26/2011 08:40 AM 0.7 0.6 - 1.2 mg/dl Final   Creatinine, Ser  Date/Time Value Ref Range Status  05/12/2018 03:00 AM 3.94 (H) 0.44 - 1.00 mg/dL Final  05/11/2018 06:50 AM 3.37 (H) 0.44 - 1.00 mg/dL Final  12/22/2017 09:15 AM 1.11 (H) 0.60 - 1.10 mg/dL Final  12/01/2017 08:38 AM 1.38 (H) 0.60 - 1.10 mg/dL Final  11/10/2017 09:21 AM 1.60 (H) 0.60 - 1.10 mg/dL Final  10/28/2017 11:11 AM 1.06 0.60 - 1.10 mg/dL Final  10/20/2017 05:22 PM 0.99 0.44 - 1.00 mg/dL Final  10/13/2017 09:57 AM 1.26 (H) 0.60 - 1.10 mg/dL Final  09/30/2017 10:52 AM 1.41 (H) 0.60 - 1.10 mg/dL Final  09/15/2017 08:22 AM 1.33 (H) 0.60 - 1.10 mg/dL Final  08/18/2017 08:54 AM 1.39 (H) 0.60 - 1.10 mg/dL Final  08/11/2017 12:06 PM 0.95 0.60 - 1.10 mg/dL Final  07/29/2017 11:36 AM 0.95 0.60 - 1.10 mg/dL Final  07/21/2017 08:42 AM 1.15 (H) 0.60 - 1.10 mg/dL Final  06/16/2017 04:17 AM 1.04 (H) 0.44 - 1.00 mg/dL Final  06/15/2017 05:00 AM 1.19 (H) 0.44 - 1.00 mg/dL Final  06/14/2017 11:23 AM 1.44 (H) 0.44 - 1.00 mg/dL Final  06/10/2017 10:28 AM 1.18 (H) 0.57 - 1.00 mg/dL Final  05/26/2016 12:35 PM 1.17 (H) 0.44 - 1.00 mg/dL Final  09/21/2010 03:26 PM 0.79 0.40 - 1.20 mg/dL Final  01/23/2010 10:04 AM 0.79 0.40 - 1.20 mg/dL Final  08/02/2009 03:07 PM 0.84 0.40 - 1.20 mg/dL Final  02/06/2009 09:51 AM 0.96 0.40 -  1.20 mg/dL Final  10/21/2008 03:26 PM 0.83 0.40 - 1.20 mg/dL Final  07/13/2008 02:33 PM 0.80 0.40 - 1.20 mg/dL Final  04/07/2008 11:44 AM 0.81 0.40 - 1.20 mg/dL Final  12/30/2007 08:33 AM 0.79 0.40 - 1.20 mg/dL Final  09/29/2007 09:39 AM 0.77 0.40 - 1.20 mg/dL Final  07/02/2007 09:18 AM 0.81 0.40 - 1.20 mg/dL Final  06/02/2007 09:15 AM 0.89 0.40 - 1.20 mg/dL Final  05/19/2007 11:54 AM 0.91 0.40 - 1.20 mg/dL Final  05/05/2007 08:57 AM 1.01 0.40 - 1.20 mg/dL Final  04/21/2007 10:02 AM 0.91 0.40 - 1.20 mg/dL Final  04/07/2007 10:04 AM 0.85 0.40 - 1.20 mg/dL Final  03/24/2007 10:31 AM 0.83 0.40 - 1.20 mg/dL Final  02/24/2007 08:50 AM 0.86 0.40 - 1.20 mg/dL Final  02/10/2007 10:27 AM 0.87 0.40 - 1.20 mg/dL Final  01/27/2007 09:07 AM 0.93 0.40 - 1.20 mg/dL Final  01/14/2007 09:02 AM 0.86 0.40 - 1.20 mg/dL Final  12/31/2006 08:40 AM 0.78 0.40 - 1.20 mg/dL Final  12/16/2006 01:30 PM 0.89  Final  12/05/2006 01:52 PM 0.94 0.40 - 1.20 mg/dL Final   Recent Labs  Lab 05/11/18 0650 05/12/18 0300  NA 130* 133*  K 4.4 5.4*  CL 97* 100  CO2 8* <7*  GLUCOSE 164* 114*  BUN 48* 58*  CREATININE 3.37* 3.94*  CALCIUM 8.8* 8.4*  PHOS  --  10.1*   Recent Labs  Lab 05/11/18 0650 05/11/18 1818 05/11/18 2215 05/12/18 0300  WBC 5.7 10.0  --  13.2*  NEUTROABS 3.7  --   --   --   HGB 6.3* 6.8* 7.6* 7.0*  HCT 19.9* 22.2* 24.6* 23.4*  MCV 97.1 98.7  --  100.4*  PLT 257 248  --  213   Liver Function Tests: No results for input(s): AST, ALT, ALKPHOS, BILITOT, PROT, ALBUMIN in the last 168 hours. No results for input(s): LIPASE, AMYLASE in the last 168 hours. No results for input(s): AMMONIA in the last 168 hours. Cardiac Enzymes: Recent Labs  Lab 05/11/18 1158 05/11/18 1538 05/11/18 2215  TROPONINI 0.12* 0.28* 0.27*   Iron Studies: No results for input(s): IRON, TIBC, TRANSFERRIN, FERRITIN in the last 72 hours. PT/INR: _0 (inr:5)  Xrays/Other Studies: ) Results for orders placed  or performed during the hospital encounter of 05/11/18 (from the past 48 hour(s))  Basic metabolic panel     Status: Abnormal   Collection Time: 05/11/18  6:50 AM  Result Value Ref Range   Sodium 130 (L) 135 - 145 mmol/L   Potassium 4.4 3.5 - 5.1 mmol/L   Chloride 97 (L) 98 - 111 mmol/L   CO2 8 (L) 22 -  32 mmol/L   Glucose, Bld 164 (H) 70 - 99 mg/dL   BUN 48 (H) 8 - 23 mg/dL   Creatinine, Ser 3.37 (H) 0.44 - 1.00 mg/dL   Calcium 8.8 (L) 8.9 - 10.3 mg/dL   GFR calc non Af Amer 14 (L) >60 mL/min   GFR calc Af Amer 16 (L) >60 mL/min    Comment: (NOTE) The eGFR has been calculated using the CKD EPI equation. This calculation has not been validated in all clinical situations. eGFR's persistently <60 mL/min signify possible Chronic Kidney Disease.    Anion gap 25 (H) 5 - 15    Comment: Performed at [facility], [place] [address], [place], [place] [address]  CBC with Differential     Status: Abnormal   Collection Time: 05/11/18  6:50 AM  Result Value Ref Range   WBC 5.7 4.0 - 10.5 K/uL   RBC 2.05 (L) 3.87 - 5.11 MIL/uL   Hemoglobin 6.3 (LL) 12.0 - 15.0 g/dL    Comment: This critical result has verified and been called to Bethena Midget, RN by Dawayne Patricia del Angel on 10 28 2019 at 0729, and has been read back. CRITICAL RESULT VERIFIED, HGB WAS RECHECKED REPEATED TO VERIFY CORRECTED ON 10/28 AT 1407: PREVIOUSLY REPORTED AS 6.3 This critical result has verified and been called to Bethena Midget, RN by Dawayne Patricia del Angel on 10 28 2019 at 0729, and has been read back. CRITICAL RESULT VERIFIED, HGB WAS RECHECKED    HCT 19.9 (L) 36.0 - 46.0 %   MCV 97.1 80.0 - 100.0 fL   MCH 30.7 26.0 - 34.0 pg   MCHC 31.7 30.0 - 36.0 g/dL   RDW 17.5 (H) 11.5 - 15.5 %   Platelets 257 150 - 400 K/uL   nRBC 0.0 0.0 - 0.2 %   Neutrophils Relative % 65 %   Neutro Abs 3.7 1.7 - 7.7 K/uL   Lymphocytes Relative 11 %   Lymphs Abs 0.6 (L) 0.7 - 4.0 K/uL   Monocytes Relative 23 %   Monocytes Absolute 1.3 (H) 0.1 -  1.0 K/uL   Eosinophils Relative 0 %   Eosinophils Absolute 0.0 0.0 - 0.5 K/uL   Basophils Relative 0 %   Basophils Absolute 0.0 0.0 - 0.1 K/uL   Immature Granulocytes 1 %   Abs Immature Granulocytes 0.04 0.00 - 0.07 K/uL    Comment: Performed at [facility] [address], [place], [place] [address]  Protime-INR     Status: Abnormal   Collection Time: 05/11/18  6:50 AM  Result Value Ref Range   Prothrombin Time 20.4 (H) 11.4 - 15.2 seconds   INR 1.78     Comment: Performed at [facility] [address], [place], [place] [address]  Brain natriuretic peptide     Status: Abnormal   Collection Time: 05/11/18  6:50 AM  Result Value Ref Range   B Natriuretic Peptide 2,681.1 (H) 0.0 - 100.0 pg/mL    Comment: Performed at [facility] [address], [place], [place] [address]  Magnesium     Status: Abnormal   Collection Time: 05/11/18  6:50 AM  Result Value Ref Range   Magnesium 1.2 (L) 1.7 - 2.4 mg/dL    Comment: Performed at [facility] [address], [place], [place] [address]  I-stat troponin, ED     Status: None   Collection Time: 05/11/18  6:55 AM  Result Value Ref Range  Troponin i, poc 0.03 0.00 - 0.08 ng/mL   Comment 3            Comment: Due to the release kinetics of cTnI, a negative result within the first hours of the onset of symptoms does not rule out myocardial infarction with certainty. If myocardial infarction is still suspected, repeat the test at appropriate intervals.   Type and screen [place]     Status: None (Preliminary result)   Collection Time: 05/11/18  8:30 AM  Result Value Ref Range   ABO/RH(D) A NEG    Antibody Screen NEG    Sample Expiration 05/14/2018    Unit Number [id number]    Blood Component Type RBC, LR IRR    Unit division 00    Status of Unit ISSUED,FINAL    Transfusion Status OK TO TRANSFUSE    Crossmatch Result Compatible    Unit Number [id number]    Blood Component Type RCLI  PHER 1    Unit division 00    Status of Unit ISSUED,FINAL    Transfusion Status OK TO TRANSFUSE    Crossmatch Result Compatible    Unit Number [id number]    Blood Component Type RCLI PHER 2    Unit division 00    Status of Unit ISSUED    Transfusion Status OK TO TRANSFUSE    Crossmatch Result      Compatible Performed at [facility], [place] [address] [place], [place] [address]    Unit Number [id number]    Blood Component Type RBC, LR IRR    Unit division 00    Status of Unit ALLOCATED    Transfusion Status OK TO TRANSFUSE    Crossmatch Result Compatible   Prepare RBC     Status: None   Collection Time: 05/11/18 10:13 AM  Result Value Ref Range   Order Confirmation      ORDER PROCESSED BY BLOOD BANK Performed at [facility], [place] [address], [place], [place] [address]   CBG monitoring, ED     Status: Abnormal   Collection Time: 05/11/18 11:52 AM  Result Value Ref Range   Glucose-Capillary 100 (H) 70 - 99 mg/dL   Comment 1 Notify RN    Comment 2 Document in Chart   Troponin I     Status: Abnormal   Collection Time: 05/11/18 11:58 AM  Result Value Ref Range   Troponin I 0.12 (HH) <0.03 ng/mL    Comment: CRITICAL RESULT CALLED TO, READ BACK BY AND VERIFIED WITH: K MOON RN 1303 05/11/2018 BY A BENNETT Performed at [facility], [place] [address], [place], [place] [address]   Troponin I     Status: Abnormal   Collection Time: 05/11/18  3:38 PM  Result Value Ref Range   Troponin I 0.28 (HH) <0.03 ng/mL    Comment: CRITICAL VALUE NOTED.  VALUE IS CONSISTENT WITH PREVIOUSLY REPORTED AND CALLED VALUE. Performed at [facility], [place] [address] [place], [place] [address]   Glucose, capillary     Status: Abnormal   Collection Time: 05/11/18  5:59 PM  Result Value Ref Range   Glucose-Capillary 106 (H) 70 - 99 mg/dL  CBC     Status: Abnormal   Collection Time: 05/11/18  6:18 PM  Result Value Ref Range   WBC 10.0 4.0 - 10.5 K/uL   RBC 2.25 (L) 3.87  - 5.11 MIL/uL   Hemoglobin 6.8 (LL) 12.0 - 15.0 g/dL  Comment: REPEATED TO VERIFY THIS CRITICAL RESULT HAS VERIFIED AND BEEN CALLED TO J. YACOPINO RN BY RASHEEDA GREEN ON 10 28 2019 AT 2005, AND HAS BEEN READ BACK. CRITICAL VALUE VERIFIED    HCT 22.2 (L) 36.0 - 46.0 %   MCV 98.7 80.0 - 100.0 fL   MCH 30.2 26.0 - 34.0 pg   MCHC 30.6 30.0 - 36.0 g/dL   RDW 17.7 (H) 11.5 - 15.5 %   Platelets 248 150 - 400 K/uL   nRBC 0.4 (H) 0.0 - 0.2 %    Comment: Performed at [facility] [address] [place], [place] [address]  MRSA PCR Screening     Status: None   Collection Time: 05/11/18  7:04 PM  Result Value Ref Range   MRSA by PCR NEGATIVE NEGATIVE    Comment:        The GeneXpert MRSA Assay (FDA approved for NASAL specimens only), is one component of a comprehensive MRSA colonization surveillance program. It is not intended to diagnose MRSA infection nor to guide or monitor treatment for MRSA infections. Performed at [facility], [place] [address] [place], [place] [address]   Glucose, capillary     Status: Abnormal   Collection Time: 05/11/18  7:45 PM  Result Value Ref Range   Glucose-Capillary 104 (H) 70 - 99 mg/dL   Comment 1 Notify RN   Troponin I     Status: Abnormal   Collection Time: 05/11/18 10:15 PM  Result Value Ref Range   Troponin I 0.27 (HH) <0.03 ng/mL    Comment: CRITICAL VALUE NOTED.  VALUE IS CONSISTENT WITH PREVIOUSLY REPORTED AND CALLED VALUE. Performed at [facility], [place] [address] [place], [place] [address]   Hemoglobin and hematocrit, blood     Status: Abnormal   Collection Time: 05/11/18 10:15 PM  Result Value Ref Range   Hemoglobin 7.6 (L) 12.0 - 15.0 g/dL   HCT 24.6 (L) 36.0 - 46.0 %    Comment: Performed at [facility] [address] [place], [place] [address]  Glucose, capillary     Status: Abnormal   Collection Time: 05/11/18 11:35 PM  Result Value Ref Range   Glucose-Capillary 116 (H) 70 - 99 mg/dL   Comment 1  Notify RN   Lactic acid, plasma     Status: Abnormal   Collection Time: 05/12/18  2:52 AM  Result Value Ref Range   Lactic Acid, Venous 15.6 (HH) 0.5 - 1.9 mmol/L    Comment: RESULTS CONFIRMED BY MANUAL DILUTION CRITICAL RESULT CALLED TO, READ BACK BY AND VERIFIED WITH: Angus Palms J,RN 05/12/18 0357 WAYK Performed at [facility], [place] [address] [place], [place] [address]   Basic metabolic panel     Status: Abnormal   Collection Time: 05/12/18  3:00 AM  Result Value Ref Range   Sodium 133 (L) 135 - 145 mmol/L   Potassium 5.4 (H) 3.5 - 5.1 mmol/L   Chloride 100 98 - 111 mmol/L   CO2 <7 (L) 22 - 32 mmol/L   Glucose, Bld 114 (H) 70 - 99 mg/dL   BUN 58 (H) 8 - 23 mg/dL   Creatinine, Ser 3.94 (H) 0.44 - 1.00 mg/dL   Calcium 8.4 (L) 8.9 - 10.3 mg/dL   GFR calc non Af Amer 11 (L) >60 mL/min   GFR calc Af Amer 13 (L) >60 mL/min    Comment: (NOTE) The eGFR has been calculated using the CKD EPI equation.  This calculation has not been validated in all clinical situations. eGFR's persistently <60 mL/min signify possible Chronic Kidney Disease. Performed at [facility], [place] [address] [place], [place] [address]   Magnesium     Status: None   Collection Time: 05/12/18  3:00 AM  Result Value Ref Range   Magnesium 2.3 1.7 - 2.4 mg/dL    Comment: Performed at [facility] [address], [place], [place] [address]  Phosphorus     Status: Abnormal   Collection Time: 05/12/18  3:00 AM  Result Value Ref Range   Phosphorus 10.1 (H) 2.5 - 4.6 mg/dL    Comment: Performed at [facility] [address] [place], [place] [address]  CBC     Status: Abnormal   Collection Time: 05/12/18  3:00 AM  Result Value Ref Range   WBC 13.2 (H) 4.0 - 10.5 K/uL   RBC 2.33 (L) 3.87 - 5.11 MIL/uL   Hemoglobin 7.0 (L) 12.0 - 15.0 g/dL   HCT 23.4 (L) 36.0 - 46.0 %   MCV 100.4 (H) 80.0 - 100.0 fL   MCH 30.0 26.0 - 34.0 pg   MCHC 29.9 (L) 30.0 - 36.0 g/dL   RDW 18.2 (H) 11.5 - 15.5 %    Platelets 213 150 - 400 K/uL   nRBC 0.5 (H) 0.0 - 0.2 %    Comment: Performed at [facility], [place] [address], [place], [place] [address]  Glucose, capillary     Status: Abnormal   Collection Time: 05/12/18  4:04 AM  Result Value Ref Range   Glucose-Capillary 104 (H) 70 - 99 mg/dL   Comment 1 Notify RN   Prepare RBC     Status: None   Collection Time: 05/12/18  4:14 AM  Result Value Ref Range   Order Confirmation      ORDER PROCESSED BY BLOOD BANK Performed at [facility], [place] [address] [place], [place] [address]   I-STAT 3, arterial blood gas (G3+)     Status: Abnormal   Collection Time: 05/12/18  4:25 AM  Result Value Ref Range   pH, Arterial 6.928 (LL) 7.350 - 7.450   pCO2 arterial 16.9 (LL) 32.0 - 48.0 mmHg   pO2, Arterial 82.0 (L) 83.0 - 108.0 mmHg   Bicarbonate 3.7 (L) 20.0 - 28.0 mmol/L   TCO2 <5 (L) 22 - 32 mmol/L   O2 Saturation 89.0 %   Acid-base deficit 27.0 (H) 0.0 - 2.0 mmol/L   Patient temperature 35.0 C    Collection site BRACHIAL ARTERY    Drawn by Operator    Sample type ARTERIAL    Comment NOTIFIED PHYSICIAN   Lactic acid, plasma     Status: Abnormal   Collection Time: 05/12/18  6:12 AM  Result Value Ref Range   Lactic Acid, Venous 13.4 (HH) 0.5 - 1.9 mmol/L    Comment: RESULTS CONFIRMED BY MANUAL DILUTION CRITICAL RESULT CALLED TO, READ BACK BY AND VERIFIED WITHDereck Ligas RN @ [phone] 05/12/18 LEONARD,A Performed at [facility], [address], [place], [place] [address]   I-STAT 3, arterial blood gas (G3+)     Status: Abnormal   Collection Time: 05/12/18  6:15 AM  Result Value Ref Range   pH, Arterial 7.153 (LL) 7.350 - 7.450   pCO2 arterial 18.8 (LL) 32.0 - 48.0 mmHg   pO2, Arterial 469.0 (H) 83.0 - 108.0 mmHg   Bicarbonate 6.8 (L) 20.0 - 28.0 mmol/L  TCO2 7 (L) 22 - 32 mmol/L   O2 Saturation 100.0 %   Acid-base deficit 21.0 (H) 0.0 - 2.0 mmol/L   Patient temperature 35.2 C    Collection site RADIAL, ALLEN'S TEST  ACCEPTABLE    Drawn by RT    Sample type ARTERIAL    Comment NOTIFIED PHYSICIAN   Glucose, capillary     Status: Abnormal   Collection Time: 05/12/18  7:37 AM  Result Value Ref Range   Glucose-Capillary 106 (H) 70 - 99 mg/dL   Comment 1 Capillary Specimen   I-STAT 3, arterial blood gas (G3+)     Status: Abnormal   Collection Time: 05/12/18  8:32 AM  Result Value Ref Range   pH, Arterial 7.253 (L) 7.350 - 7.450   pCO2 arterial 15.9 (LL) 32.0 - 48.0 mmHg   pO2, Arterial 241.0 (H) 83.0 - 108.0 mmHg   Bicarbonate 7.1 (L) 20.0 - 28.0 mmol/L   TCO2 8 (L) 22 - 32 mmol/L   O2 Saturation 100.0 %   Acid-base deficit 18.0 (H) 0.0 - 2.0 mmol/L   Patient temperature 36.3 C    Collection site ARTERIAL LINE    Drawn by Nurse    Sample type ARTERIAL    Comment NOTIFIED PHYSICIAN   Glucose, capillary     Status: Abnormal   Collection Time: 05/12/18  8:35 AM  Result Value Ref Range   Glucose-Capillary 124 (H) 70 - 99 mg/dL   Comment 1 Arterial Specimen   I-STAT 3, arterial blood gas (G3+)     Status: Abnormal   Collection Time: 05/12/18  8:51 AM  Result Value Ref Range   pH, Arterial 7.223 (L) 7.350 - 7.450   pCO2 arterial 18.5 (LL) 32.0 - 48.0 mmHg   pO2, Arterial 240.0 (H) 83.0 - 108.0 mmHg   Bicarbonate 7.7 (L) 20.0 - 28.0 mmol/L   TCO2 8 (L) 22 - 32 mmol/L   O2 Saturation 100.0 %   Acid-base deficit 18.0 (H) 0.0 - 2.0 mmol/L   Patient temperature 36.5 C    Collection site ARTERIAL LINE    Drawn by Operator    Sample type ARTERIAL    Comment NOTIFIED PHYSICIAN    Dg Chest Port 1 View  Result Date: 05/12/2018 CLINICAL DATA:  62 y/o  F; code. Enteric tube placement. EXAM: PORTABLE CHEST 1 VIEW COMPARISON:  05/12/2018 chest radiograph FINDINGS: Endotracheal tube tip projects 4.2 cm above the carina. Interval placement of right central venous catheter with tip projecting over mid SVC. Stable right port catheter position projecting over right mid SVC. Stable ill-defined opacities within  left mid and lower lung zones. Stable enlarged cardiac silhouette. Aortic calcific atherosclerosis. IMPRESSION: 1. Endotracheal tube tip projects 4.2 cm above carina. 2. Right central venous catheter tip projects over mid SVC. 3. Stable left mid and lower lung zone ill-defined opacities. Electronically Signed   By: Kristine Garbe M.D.   On: 05/12/2018 06:26   Dg Chest Port 1 View  Result Date: 05/12/2018 CLINICAL DATA:  62 y/o F; shortness of breath, hypothermia, hypotension. EXAM: PORTABLE CHEST 1 VIEW COMPARISON:  05/11/2018 chest radiograph FINDINGS: Stable cardiomegaly given projection and technique. Right central venous catheter tip projects over mid SVC and is stable. Aortic atherosclerosis with calcification. Ill-defined opacities within left mid and lower lung zone. No pleural effusion or pneumothorax. No acute osseous abnormality is evident. IMPRESSION: Ill-defined opacities within left mid and lower lung zone may represent pneumonia or asymmetric edema. Stable cardiomegaly. Electronically Signed  By: Kristine Garbe M.D.   On: 05/12/2018 02:34   Dg Chest Port 1 View  Result Date: 05/11/2018 CLINICAL DATA:  Worsening shortness of breath and palpitation. Productive cough. Dyspnea with exertion. History of colon cancer. EXAM: PORTABLE CHEST 1 VIEW COMPARISON:  06/14/2017; 06/10/2017; chest CT-03/26/2011 FINDINGS: Grossly unchanged enlarged cardiac silhouette and mediastinal contours with atherosclerotic plaque within the thoracic aorta. Re-demonstrated nodular prominence of the pulmonary hila similar to remote prior examinations and favored to represent prominence of central pulmonary vasculature. Stable positioning of support apparatus. Overall improved aeration of the lungs. No discrete focal airspace opacities. No evidence of edema. No pleural effusion or pneumothorax. No acute osseus abnormalities. Several loose bodies overlie the medial aspect of the right glenohumeral joint  likely the sequela of remote avulsive injury, incompletely evaluated. IMPRESSION: Similar findings of cardiomegaly without superimposed acute cardiopulmonary disease. Electronically Signed   By: Sandi Mariscal M.D.   On: 05/11/2018 07:10   Dg Abd Portable 1v  Result Date: 05/12/2018 CLINICAL DATA:  62 y/o  F; orogastric tube placement. EXAM: PORTABLE ABDOMEN - 1 VIEW COMPARISON:  05/08/2017 CT abdomen and pelvis FINDINGS: Enteric tube tip projects over gastric body. Normal bowel gas pattern. No radiopaque urinary stone disease identified. Lower lumbar spine degenerative changes. IMPRESSION: Enteric tube tip projects over gastric body. Electronically Signed   By: Kristine Garbe M.D.   On: 05/12/2018 06:25    PMH:   Past Medical History:  Diagnosis Date  . Cancer ([place])   . Diabetes mellitus without complication ([place])   . Hypertension   . Macrocytic anemia     PSH:   Past Surgical History:  Procedure Laterality Date  . COLON SURGERY    . IR FLUORO GUIDE PORT INSERTION RIGHT  04/02/2017  . IR US GUIDE VASC ACCESS RIGHT  04/02/2017    Allergies:  Allergies  Allergen Reactions  . Ancef [Cefazolin] Itching    Severe itching- after procedure, ancef was the antibiotic.-04/02/17  . Sulfa Antibiotics Rash and Other (See Comments)    Blisters, also    Medications:   Prior to Admission medications   Medication Sig Start Date End Date Taking? Authorizing Provider  atorvastatin (LIPITOR) 40 MG tablet Take 1 tablet (40 mg total) by mouth daily. 04/23/18  Yes [name], FNP  Azilsartan-Chlorthalidone (EDARBYCLOR) 40-12.5 MG TABS Take 1 tablet by mouth daily.    Yes [provider]  colchicine 0.6 MG tablet Take 0.6 mg by mouth 2 (two) times daily as needed (gout flare).  11/02/15  Yes [provider]  furosemide (LASIX) 20 MG tablet TAKE 1 TABLET BY MOUTH TWICE A DAY 03/19/18  Yes Shadad, Mathis Dad, MD  hydrALAZINE (APRESOLINE) 25 MG tablet Take 2 tablets (50 mg total) by  mouth 3 (three) times daily. 02/19/18  Yes Lorretta Harp, MD  insulin glargine (LANTUS) 100 unit/mL SOPN Inject 0.14 mLs (14 Units total) into the skin daily after breakfast. Patient taking differently: Inject 10 Units into the skin daily after breakfast.  06/16/17  Yes Bonnielee Haff, MD  JANUMET XR 50-500 MG TB24 Take 1 tablet by mouth daily. With evening meal 02/07/18  Yes [provider]  latanoprost (XALATAN) 0.005 % ophthalmic solution Place 1 drop into both eyes at bedtime.    Yes [provider]  lidocaine-prilocaine (EMLA) cream Apply a quarter size of cream to port 1-2 hours prior to access. Cover with saran wrap. 04/17/17  Yes [name], MD  megestrol (MEGACE) 400 MG/10ML suspension  Take 10 mLs (400 mg total) by mouth 2 (two) times daily. 03/06/18  Yes [name], MD  metoprolol tartrate (LOPRESSOR) 50 MG tablet TAKE 1 TABLET BY MOUTH TWICE A DAY WITH FOOD 05/04/18  Yes [name], FNP  Multiple Vitamins-Minerals (CENTRUM SILVER 50+WOMEN) TABS Take 1 tablet by mouth daily.   Yes [provider]  prochlorperazine (COMPAZINE) 10 MG tablet Take 1 tablet (10 mg total) by mouth every 6 (six) hours as needed for nausea or vomiting. 04/28/17  Yes [name], MD  Insulin Pen Needle 29G X 5MM MISC Use as directed 06/16/17   Bonnielee Haff, MD    Discontinued Meds:   Medications Discontinued During This Encounter  Medication Reason  . diltiazem (CARDIZEM) 100 mg in dextrose 5% 130m (1 mg/mL) infusion Duplicate  . heparin ADULT infusion 100 units/mL (25000 units/2544msodium chloride 0.45%)   . Melatonin 10 MG CAPS Patient Preference  . oxyCODONE-acetaminophen (PERCOCET/ROXICET) 5-325 MG tablet   . gabapentin (NEURONTIN) 100 MG capsule   . predniSONE (DELTASONE) 5 MG tablet   . zolpidem (AMBIEN) tablet 5 mg   . diltiazem (CARDIZEM) 1 mg/mL load via infusion 10 mg   . diltiazem (CARDIZEM) 100 mg in dextrose 5% 10037m1 mg/mL) infusion   .  aztreonam (AZACTAM) 0.5 g in dextrose 5 % 50 mL IVPB   . phenylephrine (NEOSYNEPHRINE) 10-0.9 MG/250ML-% infusion     Social History:  reports that she quit smoking about 17 years ago. Her smoking use included cigarettes. She has quit using smokeless tobacco. She reports that she drinks about 2.0 standard drinks of alcohol per week. She reports that she does not use drugs.  Family History:   Family History  Problem Relation Age of Onset  . Hypertension Mother   . Heart attack Father   . Hypertension Sister   . Hypertension Brother   . Hypertension Sister   . Hypertension Sister   . Prostate cancer Brother   . HIV/AIDS Brother     Blood pressure (!) 114/40, pulse 65, temperature 99.3 F (37.4 C), resp. rate 17, height _0  (1.626 m), weight 97.1 kg, SpO2 100 %. General appearance: no distress and slowed mentation Head: Normocephalic, without obvious abnormality, atraumatic Eyes: negative Neck: no adenopathy, no carotid bruit, supple, symmetrical, trachea midline and thyroid not enlarged, symmetric, no tenderness/mass/nodules Back: symmetric, no curvature. ROM normal. No CVA tenderness. Resp: rhonchi bilaterally Chest wall: no tenderness Cardio: irregularly irregular rhythm GI: decr BS but soft Extremities: edema trace Pulses: 2+ and symmetric Skin: Skin color, texture, turgor normal. No rashes or lesions Lymph nodes: Cervical, supraclavicular, and axillary nodes normal. Neurologic: Mental status: alertness: intubated and sedated       Alesia Oshields, JAMHunt OrisD 05/12/2018, 11:24 AM

## 2018-05-12 NOTE — Progress Notes (Signed)
CRITICAL VALUE ALERT  Critical Value:  Lactic Acid 6.0   Date & Time Notied:  05/12/2018 at 2156  Provider Notified: Warren Lacy, MD   Orders Received/Actions taken: no new orders at this time

## 2018-05-12 NOTE — Progress Notes (Signed)
Initial Nutrition Assessment  DOCUMENTATION CODES:   Obesity unspecified  INTERVENTION:    Initiate Vital High Protein at goal rate of 55 ml/h (1320 ml per day)   Provides 1320 kcals, 115 gm protein, 1103 ml free water daily  NUTRITION DIAGNOSIS:   Inadequate oral intake related to inability to eat as evidenced by NPO status  GOAL:   Provide needs based on ASPEN/SCCM guidelines  MONITOR:   Vent status, TF tolerance, Labs, Skin, Weight trends, I & O's  REASON FOR ASSESSMENT:   Consult Enteral/tube feeding initiation and management  ASSESSMENT:   62 yo Female with severe left ear and pulmonary hypertension and a history of myelodysplastic syndrome was admitted ith A. fib/RVR, anemia and AKI which was felt to be due to low perfusion.  Patient is currently on ventilator support Temp (24hrs), Avg:96.8 F (36 C), Min:94.3 F (34.6 C), Max:99.7 F (37.6 C)  OGT in place  RD unable obtain nutrition history at this time. RN's setting pt up for CVVHD initiation. Right IJ catheter placed this AM.  Pt developed hypothermia & hypotension overnight requiring intubation. PCCM note reviewed. Pt in cardiogenic shock. Likely sepsis. Severe metabolic acidosis. AKI. Starting CVVHD today.  Labs reviewed. Phos 10.1 (H). Na 133 (L). K 5.4 (H). Medications include Levophed & Versed. CBG's [phone].  NUTRITION - FOCUSED PHYSICAL EXAM:  Unable to complete at this time.  Diet Order:   Diet Order    None     EDUCATION NEEDS:   Not appropriate for education at this time  Skin:  Skin Assessment: Reviewed RN Assessment  Last BM:  10/29    Intake/Output Summary (Last 24 hours) at 05/12/2018 1333 Last data filed at 05/12/2018 1331 Gross per 24 hour  Intake 4085.56 ml  Output -  Net 4085.56 ml   Height:   Ht Readings from Last 1 Encounters:  05/11/18 5\' 4"  (1.626 m)   Weight:   Wt Readings from Last 1 Encounters:  05/12/18 (S) 101.2 kg   Ideal Body Weight:   54.5 kg  BMI:  Body mass index is 38.3 kg/m.  Estimated Nutritional Needs:   Kcal:  3888-2800  Protein:  >/= 110 gm  Fluid:  per MD  Arthur Holms, RD, LDN Pager #: [phone] After-Hours Pager #: [phone]

## 2018-05-12 NOTE — Progress Notes (Signed)
CRITICAL VALUE ALERT  Critical Value: Lactic 17.7  Date & Time Notied:  6002  Provider Notified: Dr. Elsworth Soho  Orders Received/Actions taken: Repeat lactic draw in 3 hrs

## 2018-05-12 NOTE — Progress Notes (Addendum)
[facility] Progress Note Patient Name: Patricia Mata DOB: 1956/01/04 MRN: [id number]   Date of Service  05/12/2018  HPI/Events of Note  ABG on 100%/PRVC 30/TV 490/P 5 = 7.15/16.9/469/6.8. Already on a NaHCO3 IV infusion.   eICU Interventions  Will order: 1. NaHCO3 200 meq IV now.  2. Repeat ABG at 7:30 AM.     Intervention Category Major Interventions: Acid-Base disturbance - evaluation and management;Respiratory failure - evaluation and management  [name] 05/12/2018, 6:10 AM

## 2018-05-12 NOTE — Progress Notes (Addendum)
CRITICAL VALUE ALERT  Critical Value:  Lactic Acid 15  Date & Time Notied:  05/12/18 0400   Provider Notified:   RN also informed E link that  that pt has been anuric, K elevated to 5.5, and Hgb dropped to 7.0 from 7.6 @ 2215. Bladder scan showed 150 urine- will continue to monitor

## 2018-05-12 NOTE — Progress Notes (Signed)
Hemoglobin 7.8 reported to Mylo Red RN. Verbalized she will report to [facility] MD.

## 2018-05-12 NOTE — Progress Notes (Signed)
62 year old woman with severe left ear and pulmonary hypertension and a history of myelodysplastic syndrome was admitted 10/28 with A. fib/RVR , anemia and AKI which was felt to be due to low perfusion  She developed severe acidosis overnight requiring mechanical ventilation and bicarbonate drip. She is hypotensive on Levophed drip On exam-sedated on fentanyl drip, decreased breath sounds bilateral, heart rate in the 50K, ejection systolic murmur 3/6 at the base, radiating to carotids, soft obese abdomen, no edema, cool extremities.  Chest x-ray reviewed -some haziness of the left midlung but no florid edema. Labs show worsening creatinine and lactate ABG shows severe metabolic acidosis   impression -unfortunately she has acidosis that has been refractory to bicarbonate AKI seems to be due to low flow state from severe AS and pulmonary hypertension  Plan Cardiogenic shock-titrate levo fed, concerned that lactate is rising which may indicate decreased perfusion versus decreased clearance from renal injury.  Cardiology following, she is not a surgical candidate but can consider urgent temporary balloon valvuloplasty  Acute respiratory failure-ventilator settings were reviewed and adjusted Continue fentanyl drip for sedation  AKI -we will place dialysis catheter to start CRRT, renal consulting  Start tube feeds. Guarded prognosis given to family  The patient is critically ill with multiple organ systems failure and requires high complexity decision making for assessment and support, frequent evaluation and titration of therapies, application of advanced monitoring technologies and extensive interpretation of multiple databases. Critical Care Time devoted to patient care services described in this note independent of APP/resident  time is 45 minutes.    Kara Mead MD. Shade Flood. [facility] Pager [phone] If no response call [phone]   05/12/2018

## 2018-05-12 NOTE — Progress Notes (Addendum)
Notified E-link MD to update on Pt status- Pt has has low core temps (34.7) rectal, HR 48-51 bpm,  restless/ agitation, confusion, and anuric. Bladder scan shower 150 ml in bladder- unable to get u/a at this time.

## 2018-05-12 NOTE — Progress Notes (Addendum)
eLink Physician-Brief Progress Note Patient Name: Patricia Mata DOB: 06/28/1956 MRN: [id number]   Date of Service  05/12/2018  HPI/Events of Note  Multiple issues: 1. Hypothermia and ALOC - Blood glucose pending. Temp = 97.5 with Bair Hugger. Concern for sepsis. 2. BP = 89/53 with MAP = 62. LVEF = 60 to 65%. However, patient has severe AS, moderate MS and PA Pressure = 112 c/w severe pulmonary HTN.   eICU Interventions  Will order: 1. Blood cultures X 2 now.  2. Please send ordered UA with reflex microscopic as ordered.  3. Check blood glucose now.  4. Portable CXR now.  5. Please send AM labs STAT. 6. Lactic Acid level now.  7. ABG now.  8. D/C Ambien.  9. Vancomycin and Cefepime per pharmacy consult.     Intervention Category Major Interventions: Other:;Change in mental status - evaluation and management  Seichi Kaufhold Cornelia Copa 05/12/2018, 1:43 AM

## 2018-05-12 NOTE — Progress Notes (Signed)
150 cc of fentanyl wasted in sink with Brandon Melnick RN

## 2018-05-13 ENCOUNTER — Inpatient Hospital Stay (HOSPITAL_COMMUNITY): Payer: BC Managed Care – PPO

## 2018-05-13 ENCOUNTER — Encounter: Payer: Self-pay | Admitting: *Deleted

## 2018-05-13 LAB — COMPREHENSIVE METABOLIC PANEL
ALT: 204 U/L — ABNORMAL HIGH (ref 0–44)
AST: 514 U/L — ABNORMAL HIGH (ref 15–41)
Albumin: 3 g/dL — ABNORMAL LOW (ref 3.5–5.0)
Alkaline Phosphatase: 100 U/L (ref 38–126)
Anion gap: 19 — ABNORMAL HIGH (ref 5–15)
BUN: 43 mg/dL — ABNORMAL HIGH (ref 8–23)
CO2: 19 mmol/L — ABNORMAL LOW (ref 22–32)
Calcium: 7.9 mg/dL — ABNORMAL LOW (ref 8.9–10.3)
Chloride: 100 mmol/L (ref 98–111)
Creatinine, Ser: 2.82 mg/dL — ABNORMAL HIGH (ref 0.44–1.00)
GFR calc Af Amer: 19 mL/min — ABNORMAL LOW (ref >60)
GFR calc non Af Amer: 17 mL/min — ABNORMAL LOW (ref >60)
Glucose, Bld: 212 mg/dL — ABNORMAL HIGH (ref 70–99)
Potassium: 3.3 mmol/L — ABNORMAL LOW (ref 3.5–5.1)
Sodium: 138 mmol/L (ref 135–145)
Total Bilirubin: 5.5 mg/dL — ABNORMAL HIGH (ref 0.3–1.2)
Total Protein: 5.7 g/dL — ABNORMAL LOW (ref 6.5–8.1)

## 2018-05-13 LAB — POCT ACTIVATED CLOTTING TIME
Activated Clotting Time: 136 seconds
Activated Clotting Time: 147 seconds
Activated Clotting Time: 158 seconds
Activated Clotting Time: 164 seconds
Activated Clotting Time: 164 seconds
Activated Clotting Time: 169 seconds
Activated Clotting Time: 169 seconds
Activated Clotting Time: 175 seconds
Activated Clotting Time: 175 seconds
Activated Clotting Time: 180 seconds
Activated Clotting Time: 169 seconds
Activated Clotting Time: 180 seconds
Activated Clotting Time: 186 seconds

## 2018-05-13 LAB — CBC
HCT: 21.5 % — ABNORMAL LOW (ref 36.0–46.0)
HCT: 22 % — ABNORMAL LOW (ref 36.0–46.0)
HCT: 20.1 % — ABNORMAL LOW (ref 36.0–46.0)
Hemoglobin: 6.8 g/dL — CL (ref 12.0–15.0)
Hemoglobin: 7.9 g/dL — ABNORMAL LOW (ref 12.0–15.0)
Hemoglobin: 7.1 g/dL — ABNORMAL LOW (ref 12.0–15.0)
MCH: 29.6 pg (ref 26.0–34.0)
MCH: 30.4 pg (ref 26.0–34.0)
MCH: 30.1 pg (ref 26.0–34.0)
MCHC: 31.6 g/dL (ref 30.0–36.0)
MCHC: 35.9 g/dL (ref 30.0–36.0)
MCHC: 35.3 g/dL (ref 30.0–36.0)
MCV: 93.5 fL (ref 80.0–100.0)
MCV: 84.6 fL (ref 80.0–100.0)
MCV: 85.2 fL (ref 80.0–100.0)
Platelets: 204 10*3/uL (ref 150–400)
Platelets: 156 10*3/uL (ref 150–400)
Platelets: 128 10*3/uL — ABNORMAL LOW (ref 150–400)
RBC: 2.3 MIL/uL — ABNORMAL LOW (ref 3.87–5.11)
RBC: 2.6 MIL/uL — ABNORMAL LOW (ref 3.87–5.11)
RBC: 2.36 MIL/uL — ABNORMAL LOW (ref 3.87–5.11)
RDW: 18.2 % — ABNORMAL HIGH (ref 11.5–15.5)
RDW: 16.4 % — ABNORMAL HIGH (ref 11.5–15.5)
RDW: 16.2 % — ABNORMAL HIGH (ref 11.5–15.5)
WBC: 15.9 10*3/uL — ABNORMAL HIGH (ref 4.0–10.5)
WBC: 8.9 10*3/uL (ref 4.0–10.5)
WBC: 6.2 10*3/uL (ref 4.0–10.5)
nRBC: 0.4 % — ABNORMAL HIGH (ref 0.0–0.2)
nRBC: 0.7 % — ABNORMAL HIGH (ref 0.0–0.2)
nRBC: 0.6 % — ABNORMAL HIGH (ref 0.0–0.2)

## 2018-05-13 LAB — GLUCOSE, CAPILLARY
Glucose-Capillary: 185 mg/dL — ABNORMAL HIGH (ref 70–99)
Glucose-Capillary: 204 mg/dL — ABNORMAL HIGH (ref 70–99)
Glucose-Capillary: 208 mg/dL — ABNORMAL HIGH (ref 70–99)
Glucose-Capillary: 217 mg/dL — ABNORMAL HIGH (ref 70–99)
Glucose-Capillary: 218 mg/dL — ABNORMAL HIGH (ref 70–99)
Glucose-Capillary: 222 mg/dL — ABNORMAL HIGH (ref 70–99)
Glucose-Capillary: 230 mg/dL — ABNORMAL HIGH (ref 70–99)

## 2018-05-13 LAB — PHOSPHORUS: Phosphorus: 2.4 mg/dL — ABNORMAL LOW (ref 2.5–4.6)

## 2018-05-13 LAB — RENAL FUNCTION PANEL
Albumin: 2.5 g/dL — ABNORMAL LOW (ref 3.5–5.0)
Anion gap: 12 (ref 5–15)
BUN: 30 mg/dL — ABNORMAL HIGH (ref 8–23)
CO2: 21 mmol/L — ABNORMAL LOW (ref 22–32)
Calcium: 7.5 mg/dL — ABNORMAL LOW (ref 8.9–10.3)
Chloride: 103 mmol/L (ref 98–111)
Creatinine, Ser: 1.87 mg/dL — ABNORMAL HIGH (ref 0.44–1.00)
GFR calc Af Amer: 32 mL/min — ABNORMAL LOW (ref >60)
GFR calc non Af Amer: 28 mL/min — ABNORMAL LOW (ref >60)
Glucose, Bld: 246 mg/dL — ABNORMAL HIGH (ref 70–99)
Phosphorus: 1.1 mg/dL — ABNORMAL LOW (ref 2.5–4.6)
Potassium: 3.1 mmol/L — ABNORMAL LOW (ref 3.5–5.1)
Sodium: 136 mmol/L (ref 135–145)

## 2018-05-13 LAB — PREPARE RBC (CROSSMATCH)

## 2018-05-13 LAB — BRAIN NATRIURETIC PEPTIDE: B Natriuretic Peptide: 3571.3 pg/mL — ABNORMAL HIGH (ref 0.0–100.0)

## 2018-05-13 LAB — LACTIC ACID, PLASMA: Lactic Acid, Venous: 3.9 mmol/L (ref 0.5–1.9)

## 2018-05-13 LAB — MAGNESIUM: Magnesium: 2 mg/dL (ref 1.7–2.4)

## 2018-05-13 MED ORDER — INSULIN GLARGINE 100 UNIT/ML ~~LOC~~ SOLN
10.0000 [IU] | Freq: Every day | SUBCUTANEOUS | Status: DC
  Administered 2018-05-13: 10 [IU] via SUBCUTANEOUS
  Filled 2018-05-13 (×2): qty 0.1

## 2018-05-13 MED ORDER — HEPARIN BOLUS VIA INFUSION (CRRT)
1000.0000 [IU] | INTRAVENOUS | Status: DC | PRN
  Administered 2018-05-13 (×2): 1000 [IU] via INTRAVENOUS_CENTRAL
  Filled 2018-05-13: qty 1000

## 2018-05-13 MED ORDER — HEPARIN SODIUM (PORCINE) 1000 UNIT/ML DIALYSIS
1000.0000 [IU] | INTRAMUSCULAR | Status: DC | PRN
  Administered 2018-05-16 (×2): 1400 [IU] via INTRAVENOUS_CENTRAL
  Filled 2018-05-13 (×3): qty 6

## 2018-05-13 MED ORDER — SODIUM CHLORIDE 0.9 % IJ SOLN
250.0000 [IU]/h | INTRAMUSCULAR | Status: DC
  Administered 2018-05-13: 250 [IU]/h via INTRAVENOUS_CENTRAL
  Administered 2018-05-13: 1300 [IU]/h via INTRAVENOUS_CENTRAL
  Administered 2018-05-13: 1200 [IU]/h via INTRAVENOUS_CENTRAL
  Administered 2018-05-13: 750 [IU]/h via INTRAVENOUS_CENTRAL
  Administered 2018-05-14 (×2): 1600 [IU]/h via INTRAVENOUS_CENTRAL
  Administered 2018-05-14: 1550 [IU]/h via INTRAVENOUS_CENTRAL
  Administered 2018-05-14: 1600 [IU]/h via INTRAVENOUS_CENTRAL
  Administered 2018-05-15 – 2018-05-16 (×4): 1150 [IU]/h via INTRAVENOUS_CENTRAL
  Filled 2018-05-13 (×11): qty 2

## 2018-05-13 MED ORDER — SODIUM CHLORIDE 0.9% IV SOLUTION: Freq: Once | INTRAVENOUS | Status: AC

## 2018-05-13 MED ORDER — POTASSIUM CHLORIDE 10 MEQ/50ML IV SOLN
10.0000 meq | INTRAVENOUS | Status: AC
  Administered 2018-05-13 (×2): 10 meq via INTRAVENOUS
  Filled 2018-05-13 (×2): qty 50

## 2018-05-13 NOTE — Progress Notes (Signed)
Inpatient Diabetes Program Recommendations  AACE/ADA: New Consensus Statement on Inpatient Glycemic Control (2015)  Target Ranges:  Prepandial:   less than 140 mg/dL      Peak postprandial:   less than 180 mg/dL (1-2 hours)      Critically ill patients:  140 - 180 mg/dL   Lab Results  Component Value Date   GLUCAP 217 (H) 05/13/2018   HGBA1C 11.6 (H) 06/14/2017     Results for DANAH, REINECKE (MRN [id number]) as of 05/13/2018 10:16  Ref. Range 05/12/2018 16:31 05/12/2018 19:43 05/13/2018 00:12 05/13/2018 03:36  Glucose-Capillary Latest Ref Range: 70 - 99 mg/dL 180 (H)  3 units Novolog 185 (H)  3 units Novolog 204 (H)  5 units Novolog 217 (H)  5 units Novolog     DM2   Home DM meds: Janumet 50/500 daily                              Lantus 10 units/ day  Current inpatient DM meds: Novolog (0-15 units) Q4  Patient at goal rate of tube feeding at 58ml/hour. CBG remain high since TF started.    MD please consider the following inpatient diabetes recommendations:   1. Add Novolog 4 units Q4 hours tube feed coverage (do not give if tf stopped)  2. Change to Adult ICU glycemic control order set.   Thank you.  -- Will follow during hospitalization.--  Jonna Clark RN, MSN Diabetes Coordinator Inpatient Glycemic Control Team Team Pager: [phone] (8am-5pm)

## 2018-05-13 NOTE — Progress Notes (Addendum)
Minna Antis MD critical EKG with long QTc. No new orders. Clarified that we will keep patient even at this time.

## 2018-05-13 NOTE — Progress Notes (Signed)
62 year old woman with severe left ear and pulmonary hypertension and a history of myelodysplastic syndrome was admitted 10/28 with A. fib/RVR , anemia and AKI which was felt to be due to low perfusion  10/29 She developed severe acidosis overnight requiring mechanical ventilation and bicarbonate drip. Started on CRRT with improvement in acidosis. Lactic acidosis is gradually resolved over the last 24 hours On exam-sedated on lower fentanyl drip at 502, diastolic blood pressure appears very low, but systolic is mentating about 100, discrepancy about 20 points with cough readings higher than A-line, decreased breath sounds bilateral, ejection systolic murmur 3 or 6 at base radiating to carotids, soft obese abdomen, on warmer, on CRRT.  Chest x-ray personally reviewed which shows bilateral hilar prominence and mild bibasilar atelectasis.  Labs show mild hypokalemia and improving creatinine, high BNP and slightly elevated LFTs.  Impression/plan  Cardiogenic shock-aim for systolic blood pressure 774 and a bowel with Levophed Not a candidate at this time for aortic valve replacement or valvuloplasty.  AKI -continue CRRT, acidosis has improved, hope is for renal recovery here.  On empiric cefepime for HCAP, can discontinue vancomycin since MRSA PCR negative.  Acute respiratory failure-can lighten sedation with goal RA SS -1 but not ready for weaning yet. Continue tube feeds.  Family updated, slight improvement from yesterday but guarded prognosis remains.  The patient is critically ill with multiple organ systems failure and requires high complexity decision making for assessment and support, frequent evaluation and titration of therapies, application of advanced monitoring technologies and extensive interpretation of multiple databases. Critical Care Time devoted to patient care services described in this note independent of APP/resident  time is 35 minutes.    Kara Mead MD. Shade Flood. [facility]  [facility] Pager [phone] If no response call [phone]   05/13/2018

## 2018-05-13 NOTE — Progress Notes (Signed)
[facility] Progress Note    Assessment/ Plan:   62 y.o. female with history of stage III colon cancer, MDS dx 2017,  chronic macrocytic anemia requiring frequent transfusions, DM, HTN, severeAS, severe TR, severe pulmonary hypertension with recent echocardiogram showing RVSP 112 mmHg here with progressive dyspnea found to have AKI and A. fib with RVR w/ AKI;  cr risen from 1.05 to 3.37. Eventually intubated for sepsis + metabolic acidosis with a pH of 6.93 + hyperkalemia (K5.4). She also had hypotension req Levophed w/ CXR showing either edema vs PNA. She has had no UOP since admission.  1. Acute Kidney Injury from hypoperfusion + sepsis and currently essentially anuric. Patient almost certainly in ATN and may be in for a protracted course if she recovers. Anuria is not a good sign and her cardiac status is concerning as well with the severe AS and pulm HTN.  - Dose meds for CRRT; phos should slowly decrease with RRT.  - Changed to 4K bath yesterday evening. - Trial of UF 8m/hr - Start heparin protocol (filter clotted once already)   2. Afib RVR - rate controlled now off Cardizem 3. Severe AS and pulm HTN - not stable for more definitive treatments. Per cards no valvulopasty fow now; TAVR only when off CRRT with improvement in overall status. 4. H/o MDS and colon CA 5. DM  Subjective:   Intubated.  Filter clotted once off heparin.  Seen on CRRT through LOriole Beachtemp cath. 4k bath Pre/post/qd 500/300/1500 UF net even   Objective:   BP (!) 131/39   Pulse 79   Temp (!) 97.5 F (36.4 C)   Resp (!) 30   Ht '5\' 4"'  (1.626 m)   Wt 101.4 kg   SpO2 100%   BMI 38.37 kg/m   Intake/Output Summary (Last 24 hours) at 05/13/2018 0738 Last data filed at 05/13/2018 0700 Gross per 24 hour  Intake 4831.37 ml  Output 2569 ml  Net 2262.37 ml   Weight change:   Physical Exam: General appearance: NAD, slowed mentation Head: NCAT Eyes: opening spontaneously but no  following commands Resp: rhonchi present, intubated Cardio: irregularly irregular rhythm GI: decr BS but soft Extremities: edema present tr to 1+  Imaging: [place]  Result Date: 05/12/2018 CLINICAL DATA:  Acute renal failure. EXAM: RENAL / URINARY TRACT ULTRASOUND COMPLETE COMPARISON:  CT 05/08/2017 FINDINGS: Right Kidney: Length: 9.7 cm ,height: 5.9 cm, width: 5.8 cm volume: 172.9 cc. Echogenicity within normal limits. No mass or hydronephrosis visualized. Left Kidney: Length: 11.6 cm, height 6.0 cm, width 5.9 cm, volume 215.0 cc echogenicity within normal limits. No mass or hydronephrosis visualized. Bladder: Appears normal for degree of bladder distention. IMPRESSION: Normal exam. Electronically Signed   By: TMarcello Moores Register   On: 05/12/2018 12:10   Dg Chest Port 1 View  Result Date: 05/12/2018 CLINICAL DATA:  New central line placement EXAM: PORTABLE CHEST 1 VIEW COMPARISON:  Portable chest x-ray of 05/12/2017 and 05/11/2017 FINDINGS: The tip of the endotracheal tube is approximately 4.1 cm above the carina. Right Port-A-Cath and right IJ central venous line tips overlie each other both within the mid lower SVC. A new left IJ central venous line has been inserted with the tip near the lateral wall of the mid SVC. No pneumothorax is seen. There is minimal haziness in the left mid lung and follow-up is recommended. Cardiomegaly is stable. IMPRESSION: 1. New left IJ central venous line tip overlies the lateral wall of the mid SVC. No  pneumothorax. 2. Endotracheal tube tip 4.1 cm above the carina. 3. No change in right Port-A-Cath tip and right IJ central venous line both of which are in the mid lower SVC. 4. Some haziness in the left mid lung. Recommend follow-up chest x-ray. Electronically Signed   By: Ivar Drape M.D.   On: 05/12/2018 11:49   Dg Chest Port 1 View  Result Date: 05/12/2018 CLINICAL DATA:  62 y/o  F; code. Enteric tube placement. EXAM: PORTABLE CHEST 1 VIEW COMPARISON:   05/12/2018 chest radiograph FINDINGS: Endotracheal tube tip projects 4.2 cm above the carina. Interval placement of right central venous catheter with tip projecting over mid SVC. Stable right port catheter position projecting over right mid SVC. Stable ill-defined opacities within left mid and lower lung zones. Stable enlarged cardiac silhouette. Aortic calcific atherosclerosis. IMPRESSION: 1. Endotracheal tube tip projects 4.2 cm above carina. 2. Right central venous catheter tip projects over mid SVC. 3. Stable left mid and lower lung zone ill-defined opacities. Electronically Signed   By: Kristine Garbe M.D.   On: 05/12/2018 06:26   Dg Chest Port 1 View  Result Date: 05/12/2018 CLINICAL DATA:  62 y/o F; shortness of breath, hypothermia, hypotension. EXAM: PORTABLE CHEST 1 VIEW COMPARISON:  05/11/2018 chest radiograph FINDINGS: Stable cardiomegaly given projection and technique. Right central venous catheter tip projects over mid SVC and is stable. Aortic atherosclerosis with calcification. Ill-defined opacities within left mid and lower lung zone. No pleural effusion or pneumothorax. No acute osseous abnormality is evident. IMPRESSION: Ill-defined opacities within left mid and lower lung zone may represent pneumonia or asymmetric edema. Stable cardiomegaly. Electronically Signed   By: Kristine Garbe M.D.   On: 05/12/2018 02:34   Dg Abd Portable 1v  Result Date: 05/12/2018 CLINICAL DATA:  62 y/o  F; orogastric tube placement. EXAM: PORTABLE ABDOMEN - 1 VIEW COMPARISON:  05/08/2017 CT abdomen and pelvis FINDINGS: Enteric tube tip projects over gastric body. Normal bowel gas pattern. No radiopaque urinary stone disease identified. Lower lumbar spine degenerative changes. IMPRESSION: Enteric tube tip projects over gastric body. Electronically Signed   By: Kristine Garbe M.D.   On: 05/12/2018 06:25    Labs: BMET Recent Labs  Lab 05/11/18 0650 05/12/18 0300  05/12/18 1625 05/13/18 0333  NA 130* 133* 137 138  K 4.4 5.4* 3.9 3.3*  CL 97* 100 96* 100  CO2 8* <7* 12* 19*  GLUCOSE 164* 114* 195* 212*  BUN 48* 58* 52* 43*  CREATININE 3.37* 3.94* 3.61* 2.82*  CALCIUM 8.8* 8.4* 8.1* 7.9*  PHOS  --  10.1* 7.2* 2.4*   CBC Recent Labs  Lab 05/11/18 0650  05/12/18 0300 05/12/18 1302 05/12/18 1828 05/13/18 0333  WBC 5.7   < > 13.2* 15.9* 9.8 8.9  NEUTROABS 3.7  --   --   --   --   --   HGB 6.3*   < > 7.0* 6.8* 7.8* 7.9*  HCT 19.9*   < > 23.4* 21.5* 23.2* 22.0*  MCV 97.1   < > 100.4* 93.5 87.9 84.6  PLT 257   < > 213 204 163 156   < > = values in this interval not displayed.    Medications:    . sodium chloride   Intravenous Once  . sodium chloride   Intravenous Once  . sodium chloride   Intravenous Once  . chlorhexidine gluconate (MEDLINE KIT)  15 mL Mouth Rinse BID  . Chlorhexidine Gluconate Cloth  6 each Topical Daily  .  insulin aspart  0-15 Units Subcutaneous Q4H  . latanoprost  1 drop Both Eyes QHS  . mouth rinse  15 mL Mouth Rinse 10 times per day  . midazolam  2 mg Intravenous Once  . pantoprazole sodium  40 mg Per Tube Daily  . sodium chloride flush  10-40 mL Intracatheter Q12H  . vancomycin variable dose per unstable renal function (pharmacist dosing)   Does not apply See admin instructions      Otelia Santee, MD 05/13/2018, 7:38 AM

## 2018-05-13 NOTE — Progress Notes (Signed)
DAILY PROGRESS NOTE   Patient Name: Patricia Mata Date of Encounter: 05/13/2018  Chief Complaint   Intubated, sedated on vent  Patient Profile   Patricia Mata is a 62 y.o. female with a hx of severe AS and mild to mod, MS but same as 05/2017 and severe pulmonary hypertension who is being seen today for the evaluation of a fib RVR at the request of Dr. Tyrone Nine. Admitted to ICU and developed hypotension and hypothermia concerning for sepsis.  Subjective   Lactate improved overnight on CRRT. Transfused yesterday for declining hemoglobin, now close to 8 and stable.  AST/ALT elevated at 514/204, likely shock liver. Echo shows a hyperdynamic ventricle with suprisingly lower gradient then reported recently - more suggestive of moderate stenosis. Based on these findings, there would be no benefit of valvuloplasty at this point. Ultimately, may be an inpatient TAVR candidate.  Objective   Vitals:   05/13/18 0602 05/13/18 0700 05/13/18 0750 05/13/18 0800  BP: (!) 141/51 (!) 131/39 (!) 108/34 (!) 131/39  Pulse: 80 79 82 77  Resp: 17 (!) 30 (!) 30 (!) 24  Temp: (!) 97.5 F (36.4 C) (!) 97.5 F (36.4 C) 97.9 F (36.6 C) 98.1 F (36.7 C)  TempSrc:      SpO2: 100% 100% 99% 100%  Weight:      Height:        Intake/Output Summary (Last 24 hours) at 05/13/2018 0834 Last data filed at 05/13/2018 0800 Gross per 24 hour  Intake 4070.28 ml  Output 2662 ml  Net 1408.28 ml   Filed Weights   05/11/18 0626 05/12/18 1200 05/13/18 0400  Weight: 97.1 kg (S) 101.2 kg 101.4 kg    Physical Exam   General appearance: intubated, lightly sedated on vent, opens eyes spontaneously Neck: no carotid bruit, no JVD and thyroid not enlarged, symmetric, no tenderness/mass/nodules Lungs: diminished breath sounds bilaterally Heart: regular rate and rhythm Abdomen: soft, non-tender; bowel sounds normal; no masses,  no organomegaly Extremities: extremities normal, atraumatic, no cyanosis or edema Pulses: 2+  and symmetric Skin: Skin color, texture, turgor normal. No rashes or lesions Neurologic: Mental status: intubated, sedated on vent Psych: Cannot assess   Inpatient Medications    Scheduled Meds: . sodium chloride   Intravenous Once  . sodium chloride   Intravenous Once  . sodium chloride   Intravenous Once  . chlorhexidine gluconate (MEDLINE KIT)  15 mL Mouth Rinse BID  . Chlorhexidine Gluconate Cloth  6 each Topical Daily  . insulin aspart  0-15 Units Subcutaneous Q4H  . latanoprost  1 drop Both Eyes QHS  . mouth rinse  15 mL Mouth Rinse 10 times per day  . midazolam  2 mg Intravenous Once  . pantoprazole sodium  40 mg Per Tube Daily  . sodium chloride flush  10-40 mL Intracatheter Q12H  . vancomycin variable dose per unstable renal function (pharmacist dosing)   Does not apply See admin instructions    Continuous Infusions: . sodium chloride 10 mL/hr at 05/13/18 0800  . feeding supplement (VITAL HIGH PROTEIN) 1,000 mL (05/12/18 1630)  . fentaNYL infusion INTRAVENOUS 400 mcg/hr (05/13/18 0800)  . heparin 10,000 units/ 20 mL infusion syringe    . meropenem (MERREM) IV Stopped (05/13/18 3154)  . norepinephrine (LEVOPHED) Adult infusion 12 mcg/min (05/13/18 0800)  . prismasol BGK 4/2.5 500 mL/hr at 05/13/18 0606  . prismasol BGK 4/2.5 300 mL/hr at 05/12/18 1931  . prismasol BGK 4/2.5 1,500 mL/hr at 05/13/18 0210  . sodium chloride    .  sodium chloride    . vancomycin      PRN Meds: sodium chloride, fentaNYL, heparin, heparin, midazolam, midazolam, sodium chloride, sodium chloride, sodium chloride flush   Labs   Results for orders placed or performed during the hospital encounter of 05/11/18 (from the past 48 hour(s))  Prepare RBC     Status: None   Collection Time: 05/11/18 10:13 AM  Result Value Ref Range   Order Confirmation      ORDER PROCESSED BY BLOOD BANK Performed at [facility], [place] [address] [place], [place] [address]   CBG monitoring, ED     Status:  Abnormal   Collection Time: 05/11/18 11:52 AM  Result Value Ref Range   Glucose-Capillary 100 (H) 70 - 99 mg/dL   Comment 1 Notify RN    Comment 2 Document in Chart   Troponin I     Status: Abnormal   Collection Time: 05/11/18 11:58 AM  Result Value Ref Range   Troponin I 0.12 (HH) <0.03 ng/mL    Comment: CRITICAL RESULT CALLED TO, READ BACK BY AND VERIFIED WITH: K MOON RN 1303 05/11/2018 BY A BENNETT Performed at [facility], [place] [address] [place], [place] [address]   Troponin I     Status: Abnormal   Collection Time: 05/11/18  3:38 PM  Result Value Ref Range   Troponin I 0.28 (HH) <0.03 ng/mL    Comment: CRITICAL VALUE NOTED.  VALUE IS CONSISTENT WITH PREVIOUSLY REPORTED AND CALLED VALUE. Performed at [facility], [place] [address], [place], [place] [address]   Glucose, capillary     Status: Abnormal   Collection Time: 05/11/18  5:59 PM  Result Value Ref Range   Glucose-Capillary 106 (H) 70 - 99 mg/dL  CBC     Status: Abnormal   Collection Time: 05/11/18  6:18 PM  Result Value Ref Range   WBC 10.0 4.0 - 10.5 K/uL   RBC 2.25 (L) 3.87 - 5.11 MIL/uL   Hemoglobin 6.8 (LL) 12.0 - 15.0 g/dL    Comment: REPEATED TO VERIFY THIS CRITICAL RESULT HAS VERIFIED AND BEEN CALLED TO J. YACOPINO RN BY RASHEEDA GREEN ON 10 28 2019 AT 2005, AND HAS BEEN READ BACK. CRITICAL VALUE VERIFIED    HCT 22.2 (L) 36.0 - 46.0 %   MCV 98.7 80.0 - 100.0 fL   MCH 30.2 26.0 - 34.0 pg   MCHC 30.6 30.0 - 36.0 g/dL   RDW 17.7 (H) 11.5 - 15.5 %   Platelets 248 150 - 400 K/uL   nRBC 0.4 (H) 0.0 - 0.2 %    Comment: Performed at [facility] [address], [place], [place] [address]  MRSA PCR Screening     Status: None   Collection Time: 05/11/18  7:04 PM  Result Value Ref Range   MRSA by PCR NEGATIVE NEGATIVE    Comment:        The GeneXpert MRSA Assay (FDA approved for NASAL specimens only), is one component of a comprehensive MRSA colonization surveillance program. It is  not intended to diagnose MRSA infection nor to guide or monitor treatment for MRSA infections. Performed at [facility], [place] [address], [place], [place] [address]   Glucose, capillary     Status: Abnormal   Collection Time: 05/11/18  7:45 PM  Result Value Ref Range   Glucose-Capillary 104 (H) 70 - 99 mg/dL   Comment 1 Notify RN   Troponin I  Status: Abnormal   Collection Time: 05/11/18 10:15 PM  Result Value Ref Range   Troponin I 0.27 (HH) <0.03 ng/mL    Comment: CRITICAL VALUE NOTED.  VALUE IS CONSISTENT WITH PREVIOUSLY REPORTED AND CALLED VALUE. Performed at [facility], [place] [address], [place], [place] [address]   Hemoglobin and hematocrit, blood     Status: Abnormal   Collection Time: 05/11/18 10:15 PM  Result Value Ref Range   Hemoglobin 7.6 (L) 12.0 - 15.0 g/dL   HCT 24.6 (L) 36.0 - 46.0 %    Comment: Performed at [facility] [address], [place], [place] [address]  Glucose, capillary     Status: Abnormal   Collection Time: 05/11/18 11:35 PM  Result Value Ref Range   Glucose-Capillary 116 (H) 70 - 99 mg/dL   Comment 1 Notify RN   Lactic acid, plasma     Status: Abnormal   Collection Time: 05/12/18  2:52 AM  Result Value Ref Range   Lactic Acid, Venous 15.6 (HH) 0.5 - 1.9 mmol/L    Comment: RESULTS CONFIRMED BY MANUAL DILUTION CRITICAL RESULT CALLED TO, READ BACK BY AND VERIFIED WITH: Angus Palms J,RN 05/12/18 0357 WAYK Performed at [facility], [place] [address], [place], [place] [address]   Basic metabolic panel     Status: Abnormal   Collection Time: 05/12/18  3:00 AM  Result Value Ref Range   Sodium 133 (L) 135 - 145 mmol/L   Potassium 5.4 (H) 3.5 - 5.1 mmol/L   Chloride 100 98 - 111 mmol/L   CO2 <7 (L) 22 - 32 mmol/L   Glucose, Bld 114 (H) 70 - 99 mg/dL   BUN 58 (H) 8 - 23 mg/dL   Creatinine, Ser 3.94 (H) 0.44 - 1.00 mg/dL   Calcium 8.4 (L) 8.9 - 10.3 mg/dL   GFR calc non Af Amer 11 (L) >60 mL/min   GFR calc Af Amer 13 (L)  >60 mL/min    Comment: (NOTE) The eGFR has been calculated using the CKD EPI equation. This calculation has not been validated in all clinical situations. eGFR's persistently <60 mL/min signify possible Chronic Kidney Disease. Performed at [facility], [place] [address], [place], [place] [address]   Magnesium     Status: None   Collection Time: 05/12/18  3:00 AM  Result Value Ref Range   Magnesium 2.3 1.7 - 2.4 mg/dL    Comment: Performed at [facility] [address] [place], [place] [address]  Phosphorus     Status: Abnormal   Collection Time: 05/12/18  3:00 AM  Result Value Ref Range   Phosphorus 10.1 (H) 2.5 - 4.6 mg/dL    Comment: Performed at [facility] [address] [place], [place] [address]  CBC     Status: Abnormal   Collection Time: 05/12/18  3:00 AM  Result Value Ref Range   WBC 13.2 (H) 4.0 - 10.5 K/uL   RBC 2.33 (L) 3.87 - 5.11 MIL/uL   Hemoglobin 7.0 (L) 12.0 - 15.0 g/dL   HCT 23.4 (L) 36.0 - 46.0 %   MCV 100.4 (H) 80.0 - 100.0 fL   MCH 30.0 26.0 - 34.0 pg   MCHC 29.9 (L) 30.0 - 36.0 g/dL   RDW 18.2 (H) 11.5 - 15.5 %   Platelets 213 150 - 400 K/uL   nRBC 0.5 (H) 0.0 - 0.2 %    Comment: Performed at [facility], [place]  [address], [place], [place] [address]  Glucose, capillary     Status: Abnormal   Collection Time: 05/12/18  4:04 AM  Result Value Ref Range   Glucose-Capillary 104 (H) 70 - 99 mg/dL   Comment 1 Notify RN   Prepare RBC     Status: None   Collection Time: 05/12/18  4:14 AM  Result Value Ref Range   Order Confirmation      ORDER PROCESSED BY BLOOD BANK Performed at [facility], [place] [address], [place], [place] [address]   I-STAT 3, arterial blood gas (G3+)     Status: Abnormal   Collection Time: 05/12/18  4:25 AM  Result Value Ref Range   pH, Arterial 6.928 (LL) 7.350 - 7.450   pCO2 arterial 16.9 (LL) 32.0 - 48.0 mmHg   pO2, Arterial 82.0 (L) 83.0 - 108.0 mmHg   Bicarbonate 3.7 (L) 20.0 - 28.0 mmol/L    TCO2 <5 (L) 22 - 32 mmol/L   O2 Saturation 89.0 %   Acid-base deficit 27.0 (H) 0.0 - 2.0 mmol/L   Patient temperature 35.0 C    Collection site BRACHIAL ARTERY    Drawn by Operator    Sample type ARTERIAL    Comment NOTIFIED PHYSICIAN   Lactic acid, plasma     Status: Abnormal   Collection Time: 05/12/18  6:12 AM  Result Value Ref Range   Lactic Acid, Venous 13.4 (HH) 0.5 - 1.9 mmol/L    Comment: RESULTS CONFIRMED BY MANUAL DILUTION CRITICAL RESULT CALLED TO, READ BACK BY AND VERIFIED WITHDereck Ligas RN @ [phone] 05/12/18 LEONARD,A Performed at [facility], [address] [place], [place] [address]   I-STAT 3, arterial blood gas (G3+)     Status: Abnormal   Collection Time: 05/12/18  6:15 AM  Result Value Ref Range   pH, Arterial 7.153 (LL) 7.350 - 7.450   pCO2 arterial 18.8 (LL) 32.0 - 48.0 mmHg   pO2, Arterial 469.0 (H) 83.0 - 108.0 mmHg   Bicarbonate 6.8 (L) 20.0 - 28.0 mmol/L   TCO2 7 (L) 22 - 32 mmol/L   O2 Saturation 100.0 %   Acid-base deficit 21.0 (H) 0.0 - 2.0 mmol/L   Patient temperature 35.2 C    Collection site RADIAL, ALLEN'S TEST ACCEPTABLE    Drawn by RT    Sample type ARTERIAL    Comment NOTIFIED PHYSICIAN   Glucose, capillary     Status: Abnormal   Collection Time: 05/12/18  7:37 AM  Result Value Ref Range   Glucose-Capillary 106 (H) 70 - 99 mg/dL   Comment 1 Capillary Specimen   I-STAT 3, arterial blood gas (G3+)     Status: Abnormal   Collection Time: 05/12/18  8:32 AM  Result Value Ref Range   pH, Arterial 7.253 (L) 7.350 - 7.450   pCO2 arterial 15.9 (LL) 32.0 - 48.0 mmHg   pO2, Arterial 241.0 (H) 83.0 - 108.0 mmHg   Bicarbonate 7.1 (L) 20.0 - 28.0 mmol/L   TCO2 8 (L) 22 - 32 mmol/L   O2 Saturation 100.0 %   Acid-base deficit 18.0 (H) 0.0 - 2.0 mmol/L   Patient temperature 36.3 C    Collection site ARTERIAL LINE    Drawn by Nurse    Sample type ARTERIAL    Comment NOTIFIED PHYSICIAN   Glucose, capillary     Status: Abnormal   Collection  Time: 05/12/18  8:35 AM  Result Value Ref Range   Glucose-Capillary 124 (H) 70 -  99 mg/dL   Comment 1 Arterial Specimen   I-STAT 3, arterial blood gas (G3+)     Status: Abnormal   Collection Time: 05/12/18  8:51 AM  Result Value Ref Range   pH, Arterial 7.223 (L) 7.350 - 7.450   pCO2 arterial 18.5 (LL) 32.0 - 48.0 mmHg   pO2, Arterial 240.0 (H) 83.0 - 108.0 mmHg   Bicarbonate 7.7 (L) 20.0 - 28.0 mmol/L   TCO2 8 (L) 22 - 32 mmol/L   O2 Saturation 100.0 %   Acid-base deficit 18.0 (H) 0.0 - 2.0 mmol/L   Patient temperature 36.5 C    Collection site ARTERIAL LINE    Drawn by Operator    Sample type ARTERIAL    Comment NOTIFIED PHYSICIAN   Lactic acid, plasma     Status: Abnormal   Collection Time: 05/12/18 10:12 AM  Result Value Ref Range   Lactic Acid, Venous 17.7 (HH) 0.5 - 1.9 mmol/L    Comment: RESULTS CONFIRMED BY MANUAL DILUTION CRITICAL RESULT CALLED TO, READ BACK BY AND VERIFIED WITH: COOK,T RN @ [phone] 05/12/18 LEONARD,A Performed at [facility], [address], [place], [place] [address]   Glucose, capillary     Status: Abnormal   Collection Time: 05/12/18 11:22 AM  Result Value Ref Range   Glucose-Capillary 126 (H) 70 - 99 mg/dL   Comment 1 Arterial Specimen   Culture, respiratory (non-expectorated)     Status: None (Preliminary result)   Collection Time: 05/12/18 11:34 AM  Result Value Ref Range   Specimen Description TRACHEAL ASPIRATE    Special Requests Immunocompromised    Gram Stain      MODERATE WBC PRESENT, PREDOMINANTLY PMN RARE GRAM POSITIVE COCCI RARE GRAM POSITIVE RODS Performed at [facility], [place] [address], [place], [place] [address]    Culture PENDING    Report Status PENDING   CBC     Status: Abnormal   Collection Time: 05/12/18  1:02 PM  Result Value Ref Range   WBC 15.9 (H) 4.0 - 10.5 K/uL   RBC 2.30 (L) 3.87 - 5.11 MIL/uL   Hemoglobin 6.8 (LL) 12.0 - 15.0 g/dL    Comment: REPEATED TO VERIFY THIS CRITICAL RESULT HAS VERIFIED  AND BEEN CALLED TO TREVOR COOK,RN BY ZELDA BEECH ON 10 29 2019 AT 1357, AND HAS BEEN READ BACK. CRITICAL VALVE VERIFIED    HCT 21.5 (L) 36.0 - 46.0 %   MCV 93.5 80.0 - 100.0 fL    Comment: REPEATED TO VERIFY   MCH 29.6 26.0 - 34.0 pg   MCHC 31.6 30.0 - 36.0 g/dL   RDW 18.2 (H) 11.5 - 15.5 %   Platelets 204 150 - 400 K/uL   nRBC 0.4 (H) 0.0 - 0.2 %    Comment: Performed at [facility] [address] [place], [place] [address]  Lactic acid, plasma     Status: Abnormal   Collection Time: 05/12/18  1:07 PM  Result Value Ref Range   Lactic Acid, Venous 14.2 (HH) 0.5 - 1.9 mmol/L    Comment: RESULTS CONFIRMED BY MANUAL DILUTION CRITICAL RESULT CALLED TO, READ BACK BY AND VERIFIED WITHAgnes Lawrence RN 1441 05/12/2018 BY A BENNETT Performed at [facility], [place] [address], [place], [place] [address]   Prepare RBC     Status: None   Collection Time: 05/12/18  2:43 PM  Result Value Ref Range   Order Confirmation      ORDER PROCESSED BY  BLOOD BANK Performed at [facility], [place] [address] [place], [place] [address]   Prepare RBC     Status: None   Collection Time: 05/12/18  3:18 PM  Result Value Ref Range   Order Confirmation      BB SAMPLE OR UNITS ALREADY AVAILABLE Performed at [facility], [address], [place], [place] [address]   I-STAT 3, arterial blood gas (G3+)     Status: Abnormal   Collection Time: 05/12/18  3:24 PM  Result Value Ref Range   pH, Arterial 7.428 7.350 - 7.450   pCO2 arterial 17.3 (LL) 32.0 - 48.0 mmHg   pO2, Arterial 249.0 (H) 83.0 - 108.0 mmHg   Bicarbonate 11.5 (L) 20.0 - 28.0 mmol/L   TCO2 12 (L) 22 - 32 mmol/L   O2 Saturation 100.0 %   Acid-base deficit 12.0 (H) 0.0 - 2.0 mmol/L   Patient temperature 36.2 C    Collection site ARTERIAL LINE    Drawn by Nurse    Sample type ARTERIAL    Comment NOTIFIED PHYSICIAN   Renal function panel (daily at 1600)     Status: Abnormal   Collection Time: 05/12/18  4:25 PM  Result Value  Ref Range   Sodium 137 135 - 145 mmol/L   Potassium 3.9 3.5 - 5.1 mmol/L    Comment: DELTA CHECK NOTED   Chloride 96 (L) 98 - 111 mmol/L   CO2 12 (L) 22 - 32 mmol/L   Glucose, Bld 195 (H) 70 - 99 mg/dL   BUN 52 (H) 8 - 23 mg/dL   Creatinine, Ser 3.61 (H) 0.44 - 1.00 mg/dL   Calcium 8.1 (L) 8.9 - 10.3 mg/dL   Phosphorus 7.2 (H) 2.5 - 4.6 mg/dL   Albumin 3.4 (L) 3.5 - 5.0 g/dL   GFR calc non Af Amer 12 (L) >60 mL/min   GFR calc Af Amer 14 (L) >60 mL/min    Comment: (NOTE) The eGFR has been calculated using the CKD EPI equation. This calculation has not been validated in all clinical situations. eGFR's persistently <60 mL/min signify possible Chronic Kidney Disease.    Anion gap 29 (H) 5 - 15    Comment: Performed at [facility], [place] [address] [place], [place] [address]  Glucose, capillary     Status: Abnormal   Collection Time: 05/12/18  4:31 PM  Result Value Ref Range   Glucose-Capillary 180 (H) 70 - 99 mg/dL  CBC     Status: Abnormal   Collection Time: 05/12/18  6:28 PM  Result Value Ref Range   WBC 9.8 4.0 - 10.5 K/uL   RBC 2.64 (L) 3.87 - 5.11 MIL/uL   Hemoglobin 7.8 (L) 12.0 - 15.0 g/dL   HCT 23.2 (L) 36.0 - 46.0 %   MCV 87.9 80.0 - 100.0 fL   MCH 29.5 26.0 - 34.0 pg   MCHC 33.6 30.0 - 36.0 g/dL   RDW 16.9 (H) 11.5 - 15.5 %   Platelets 163 150 - 400 K/uL   nRBC 0.5 (H) 0.0 - 0.2 %    Comment: Performed at [facility], [place] [address], [place], [place] [address]  Glucose, capillary     Status: Abnormal   Collection Time: 05/12/18  7:43 PM  Result Value Ref Range   Glucose-Capillary 185 (H) 70 - 99 mg/dL  Lactic acid, plasma     Status: Abnormal   Collection Time: 05/12/18  8:26 PM  Result Value Ref  Range   Lactic Acid, Venous 6.0 (HH) 0.5 - 1.9 mmol/L    Comment: CRITICAL RESULT CALLED TO, READ BACK BY AND VERIFIED WITH: Royden Purl [facility] 05/12/18 2156 WAYK Performed at [facility] [address], [place], [place] [address]   Glucose, capillary      Status: Abnormal   Collection Time: 05/13/18 12:12 AM  Result Value Ref Range   Glucose-Capillary 204 (H) 70 - 99 mg/dL  CBC     Status: Abnormal   Collection Time: 05/13/18  3:33 AM  Result Value Ref Range   WBC 8.9 4.0 - 10.5 K/uL   RBC 2.60 (L) 3.87 - 5.11 MIL/uL   Hemoglobin 7.9 (L) 12.0 - 15.0 g/dL   HCT 22.0 (L) 36.0 - 46.0 %   MCV 84.6 80.0 - 100.0 fL   MCH 30.4 26.0 - 34.0 pg   MCHC 35.9 30.0 - 36.0 g/dL   RDW 16.4 (H) 11.5 - 15.5 %   Platelets 156 150 - 400 K/uL   nRBC 0.7 (H) 0.0 - 0.2 %    Comment: Performed at [facility], [place] [address], [place], [place] [address]  Comprehensive metabolic panel     Status: Abnormal   Collection Time: 05/13/18  3:33 AM  Result Value Ref Range   Sodium 138 135 - 145 mmol/L   Potassium 3.3 (L) 3.5 - 5.1 mmol/L   Chloride 100 98 - 111 mmol/L   CO2 19 (L) 22 - 32 mmol/L   Glucose, Bld 212 (H) 70 - 99 mg/dL   BUN 43 (H) 8 - 23 mg/dL   Creatinine, Ser 2.82 (H) 0.44 - 1.00 mg/dL   Calcium 7.9 (L) 8.9 - 10.3 mg/dL   Total Protein 5.7 (L) 6.5 - 8.1 g/dL   Albumin 3.0 (L) 3.5 - 5.0 g/dL   AST 514 (H) 15 - 41 U/L   ALT 204 (H) 0 - 44 U/L   Alkaline Phosphatase 100 38 - 126 U/L   Total Bilirubin 5.5 (H) 0.3 - 1.2 mg/dL   GFR calc non Af Amer 17 (L) >60 mL/min   GFR calc Af Amer 19 (L) >60 mL/min    Comment: (NOTE) The eGFR has been calculated using the CKD EPI equation. This calculation has not been validated in all clinical situations. eGFR's persistently <60 mL/min signify possible Chronic Kidney Disease.    Anion gap 19 (H) 5 - 15    Comment: Performed at [facility], [place] [address] [place], [place] [address]  Lactic acid, plasma     Status: Abnormal   Collection Time: 05/13/18  3:33 AM  Result Value Ref Range   Lactic Acid, Venous 3.9 (HH) 0.5 - 1.9 mmol/L    Comment: CRITICAL RESULT CALLED TO, READ BACK BY AND VERIFIED WITH: Loreta Ave 05/13/18 [place] Performed at [facility] [address]  [place], [place] [address]   Brain natriuretic peptide     Status: Abnormal   Collection Time: 05/13/18  3:33 AM  Result Value Ref Range   B Natriuretic Peptide 3,571.3 (H) 0.0 - 100.0 pg/mL    Comment: Performed at [facility] [address] [place], [place] [address]  Magnesium     Status: None   Collection Time: 05/13/18  3:33 AM  Result Value Ref Range   Magnesium 2.0 1.7 - 2.4 mg/dL    Comment: Performed at [facility] [address] [place], [place] [address]  Phosphorus  Status: Abnormal   Collection Time: 05/13/18  3:33 AM  Result Value Ref Range   Phosphorus 2.4 (L) 2.5 - 4.6 mg/dL    Comment: Performed at [facility] [address] [place], [place] [address]  Glucose, capillary     Status: Abnormal   Collection Time: 05/13/18  3:36 AM  Result Value Ref Range   Glucose-Capillary 217 (H) 70 - 99 mg/dL    ECG   N/A  Telemetry   Sinus rhythm - Personally Reviewed  Radiology    US Renal  Result Date: 05/12/2018 CLINICAL DATA:  Acute renal failure. EXAM: RENAL / URINARY TRACT ULTRASOUND COMPLETE COMPARISON:  CT 05/08/2017 FINDINGS: Right Kidney: Length: 9.7 cm ,height: 5.9 cm, width: 5.8 cm volume: 172.9 cc. Echogenicity within normal limits. No mass or hydronephrosis visualized. Left Kidney: Length: 11.6 cm, height 6.0 cm, width 5.9 cm, volume 215.0 cc echogenicity within normal limits. No mass or hydronephrosis visualized. Bladder: Appears normal for degree of bladder distention. IMPRESSION: Normal exam. Electronically Signed   By: Marcello Moores  Register   On: 05/12/2018 12:10   Dg Chest Port 1 View  Result Date: 05/12/2018 CLINICAL DATA:  New central line placement EXAM: PORTABLE CHEST 1 VIEW COMPARISON:  Portable chest x-ray of 05/12/2017 and 05/11/2017 FINDINGS: The tip of the endotracheal tube is approximately 4.1 cm above the carina. Right Port-A-Cath and right IJ central venous line tips overlie each other both within the mid lower SVC. A new  left IJ central venous line has been inserted with the tip near the lateral wall of the mid SVC. No pneumothorax is seen. There is minimal haziness in the left mid lung and follow-up is recommended. Cardiomegaly is stable. IMPRESSION: 1. New left IJ central venous line tip overlies the lateral wall of the mid SVC. No pneumothorax. 2. Endotracheal tube tip 4.1 cm above the carina. 3. No change in right Port-A-Cath tip and right IJ central venous line both of which are in the mid lower SVC. 4. Some haziness in the left mid lung. Recommend follow-up chest x-ray. Electronically Signed   By: Ivar Drape M.D.   On: 05/12/2018 11:49   Dg Chest Port 1 View  Result Date: 05/12/2018 CLINICAL DATA:  62 y/o  F; code. Enteric tube placement. EXAM: PORTABLE CHEST 1 VIEW COMPARISON:  05/12/2018 chest radiograph FINDINGS: Endotracheal tube tip projects 4.2 cm above the carina. Interval placement of right central venous catheter with tip projecting over mid SVC. Stable right port catheter position projecting over right mid SVC. Stable ill-defined opacities within left mid and lower lung zones. Stable enlarged cardiac silhouette. Aortic calcific atherosclerosis. IMPRESSION: 1. Endotracheal tube tip projects 4.2 cm above carina. 2. Right central venous catheter tip projects over mid SVC. 3. Stable left mid and lower lung zone ill-defined opacities. Electronically Signed   By: Kristine Garbe M.D.   On: 05/12/2018 06:26   Dg Chest Port 1 View  Result Date: 05/12/2018 CLINICAL DATA:  62 y/o F; shortness of breath, hypothermia, hypotension. EXAM: PORTABLE CHEST 1 VIEW COMPARISON:  05/11/2018 chest radiograph FINDINGS: Stable cardiomegaly given projection and technique. Right central venous catheter tip projects over mid SVC and is stable. Aortic atherosclerosis with calcification. Ill-defined opacities within left mid and lower lung zone. No pleural effusion or pneumothorax. No acute osseous abnormality is evident.  IMPRESSION: Ill-defined opacities within left mid and lower lung zone may represent pneumonia or asymmetric edema. Stable cardiomegaly. Electronically Signed   By: Edgardo Roys.D.  On: 05/12/2018 02:34   Dg Abd Portable 1v  Result Date: 05/12/2018 CLINICAL DATA:  62 y/o  F; orogastric tube placement. EXAM: PORTABLE ABDOMEN - 1 VIEW COMPARISON:  05/08/2017 CT abdomen and pelvis FINDINGS: Enteric tube tip projects over gastric body. Normal bowel gas pattern. No radiopaque urinary stone disease identified. Lower lumbar spine degenerative changes. IMPRESSION: Enteric tube tip projects over gastric body. Electronically Signed   By: Kristine Garbe M.D.   On: 05/12/2018 06:25    Cardiac Studies   N/A  Assessment   Active Problems:   Critical aortic valve stenosis   A-fib (HCC)   Acute kidney injury (nontraumatic) (HCC)   Lactic acidosis   Acute hypoxemic respiratory failure (HCC)   Cardiogenic shock (HCC)   Plan   1. Shock - combination of likely sepsis and poor cardiac output due to fixed afterload of aortic stenosis. Agree with levophed for pressor support. BCx pending. On broad spectrum antibiotics - given long QTc, avoid QT prolonging drugs (ok for vancomycin and meropenem). 2. Critical aortic stenosis - echo yesterday shows AS may not be as bad - gradient suggests moderate stenosis - visually moderate to severe. No indication for urgent valvuloplasty at this point - but would certainly pursue TAVR when she recovers from shock. 3. PAF - maintaining sinus rhythm without medication support 4. Oliguric/anuric renal failure - rising creatinine with little urine output with significant metabolic acidosis. On CRRT - improving lactate. 5. ?Sepsis - unclear of source of infection or if metabolic acidosis due to renal failure, presumed secondary to low cardiac output, ?ATN, etc .. 6. Long QTc- noted on EKG yesterday. Would continue to monitor with daily 12 lead EKG's -  avoid QT prolonging agents.  CRITICAL CARE TIME: I have spent a total of 35 minutes with patient reviewing hospital notes, telemetry, EKGs, labs and examining the patient as well as establishing an assessment and plan that was discussed with the patient. > 50% of time was spent in direct patient care. The patient is critically ill with multi-organ system failure and requires high complexity decision making for assessment and support, frequent evaluation and titration of therapies, application of advanced monitoring technologies and extensive interpretation of multiple databases.  Length of Stay:  LOS: 2 days   Pixie Casino, MD, [facility], [place] Director of the Advanced Lipid Disorders &  Cardiovascular Risk Reduction Clinic Diplomate of the American Board of Clinical Lipidology Attending Cardiologist  Direct Dial: [phone]  Fax: [phone]  Website:  [email or web address] Mazon 05/13/2018, 8:34 AM

## 2018-05-13 NOTE — Progress Notes (Signed)
eLink Physician-Brief Progress Note Patient Name: Patricia Mata DOB: [birth year] MRN: [id number]   Date of Service  05/13/2018  HPI/Events of Note  Hgb 7.1 on CRRT, off pressors, no active bleeding, history of MDS s/p 4 units PRBC for the past 4 days  eICU Interventions  Transfuse 1 more unit PRBC to optimize renal perfusion     Intervention Category Intermediate Interventions: Bleeding - evaluation and treatment with blood products  Judd Lien 05/13/2018, 7:20 PM

## 2018-05-13 NOTE — Progress Notes (Signed)
CRITICAL VALUE ALERT  Critical Value:  Lactic Acid 3.9  Date & Time Notied:  05/13/2018 at 0434  Provider Notified: Provider not notified. Trending down as expected.  Orders Received/Actions taken: No new orders

## 2018-05-13 NOTE — Progress Notes (Signed)
[place], MD notified of bladder scan >400 after 2 days of being intubated and on CRRT. Verbal orders to do in and out cath first and recheck bladder scan in 6 hours. Will intiate orders and continue to monitor patient.

## 2018-05-13 NOTE — Progress Notes (Signed)
Notified Alva MD of elevated CBGs over course of shift. Orders for 10 units of Lantus once a day. Also notified of bladder scan volume around 300. Will only insert foley if greater than 400 mL in bladder.  Notified [place] MD of K trending down to 3.1. Orders for 2 runs of K 10 mEq over one hour each.

## 2018-05-14 DIAGNOSIS — R9431 Abnormal electrocardiogram [ECG] [EKG]: Secondary | ICD-10-CM

## 2018-05-14 LAB — GLUCOSE, CAPILLARY
Glucose-Capillary: 131 mg/dL — ABNORMAL HIGH (ref 70–99)
Glucose-Capillary: 231 mg/dL — ABNORMAL HIGH (ref 70–99)
Glucose-Capillary: 241 mg/dL — ABNORMAL HIGH (ref 70–99)
Glucose-Capillary: 250 mg/dL — ABNORMAL HIGH (ref 70–99)
Glucose-Capillary: 255 mg/dL — ABNORMAL HIGH (ref 70–99)
Glucose-Capillary: 266 mg/dL — ABNORMAL HIGH (ref 70–99)

## 2018-05-14 LAB — RENAL FUNCTION PANEL
Albumin: 2.4 g/dL — ABNORMAL LOW (ref 3.5–5.0)
Albumin: 2.6 g/dL — ABNORMAL LOW (ref 3.5–5.0)
Anion gap: 8 (ref 5–15)
Anion gap: 7 (ref 5–15)
BUN: 22 mg/dL (ref 8–23)
BUN: 17 mg/dL (ref 8–23)
CO2: 24 mmol/L (ref 22–32)
CO2: 28 mmol/L (ref 22–32)
Calcium: 7.7 mg/dL — ABNORMAL LOW (ref 8.9–10.3)
Calcium: 7.8 mg/dL — ABNORMAL LOW (ref 8.9–10.3)
Chloride: 105 mmol/L (ref 98–111)
Chloride: 102 mmol/L (ref 98–111)
Creatinine, Ser: 1.47 mg/dL — ABNORMAL HIGH (ref 0.44–1.00)
Creatinine, Ser: 1.1 mg/dL — ABNORMAL HIGH (ref 0.44–1.00)
GFR calc Af Amer: 43 mL/min — ABNORMAL LOW (ref >60)
GFR calc Af Amer: >60 mL/min (ref >60)
GFR calc non Af Amer: 37 mL/min — ABNORMAL LOW (ref >60)
GFR calc non Af Amer: 52 mL/min — ABNORMAL LOW (ref >60)
Glucose, Bld: 251 mg/dL — ABNORMAL HIGH (ref 70–99)
Glucose, Bld: 246 mg/dL — ABNORMAL HIGH (ref 70–99)
Phosphorus: <1 mg/dL — CL (ref 2.5–4.6)
Phosphorus: 2.4 mg/dL — ABNORMAL LOW (ref 2.5–4.6)
Potassium: 3.4 mmol/L — ABNORMAL LOW (ref 3.5–5.1)
Potassium: 4.3 mmol/L (ref 3.5–5.1)
Sodium: 137 mmol/L (ref 135–145)
Sodium: 137 mmol/L (ref 135–145)

## 2018-05-14 LAB — CULTURE, RESPIRATORY W GRAM STAIN

## 2018-05-14 LAB — URINALYSIS, ROUTINE W REFLEX MICROSCOPIC
Bilirubin Urine: NEGATIVE
Glucose, UA: NEGATIVE mg/dL
Hgb urine dipstick: NEGATIVE
Ketones, ur: 5 mg/dL — AB
Leukocytes, UA: NEGATIVE
Nitrite: NEGATIVE
Protein, ur: 100 mg/dL — AB
Specific Gravity, Urine: 1.017 (ref 1.005–1.030)
pH: 5 (ref 5.0–8.0)

## 2018-05-14 LAB — POCT ACTIVATED CLOTTING TIME
Activated Clotting Time: 186 seconds
Activated Clotting Time: 186 seconds
Activated Clotting Time: 186 seconds
Activated Clotting Time: 186 seconds
Activated Clotting Time: 191 seconds
Activated Clotting Time: 191 seconds
Activated Clotting Time: 197 seconds
Activated Clotting Time: 197 seconds
Activated Clotting Time: 191 seconds
Activated Clotting Time: 208 seconds
Activated Clotting Time: 208 seconds
Activated Clotting Time: 202 seconds

## 2018-05-14 LAB — CBC
HCT: 22.1 % — ABNORMAL LOW (ref 36.0–46.0)
Hemoglobin: 7.5 g/dL — ABNORMAL LOW (ref 12.0–15.0)
MCH: 29.5 pg (ref 26.0–34.0)
MCHC: 33.9 g/dL (ref 30.0–36.0)
MCV: 87 fL (ref 80.0–100.0)
Platelets: 126 10*3/uL — ABNORMAL LOW (ref 150–400)
RBC: 2.54 MIL/uL — ABNORMAL LOW (ref 3.87–5.11)
RDW: 16.2 % — ABNORMAL HIGH (ref 11.5–15.5)
WBC: 5.5 10*3/uL (ref 4.0–10.5)
nRBC: 1.4 % — ABNORMAL HIGH (ref 0.0–0.2)

## 2018-05-14 LAB — TROPONIN I
Troponin I: 0.15 ng/mL (ref <0.03)
Troponin I: 0.15 ng/mL (ref <0.03)
Troponin I: 0.13 ng/mL (ref <0.03)

## 2018-05-14 LAB — PHOSPHORUS: Phosphorus: 2.4 mg/dL — ABNORMAL LOW (ref 2.5–4.6)

## 2018-05-14 LAB — MAGNESIUM: Magnesium: 2.1 mg/dL (ref 1.7–2.4)

## 2018-05-14 LAB — CULTURE, RESPIRATORY: Culture: NORMAL

## 2018-05-14 LAB — APTT: aPTT: 193 seconds (ref 24–36)

## 2018-05-14 MED ORDER — POTASSIUM PHOSPHATES 15 MMOLE/5ML IV SOLN
20.0000 mmol | Freq: Once | INTRAVENOUS | Status: AC
  Administered 2018-05-14: 20 mmol via INTRAVENOUS
  Filled 2018-05-14: qty 6.67

## 2018-05-14 MED ORDER — "THROMBI-PAD 3""X3"" EX PADS"
1.0000 | MEDICATED_PAD | Freq: Once | CUTANEOUS | Status: AC
  Administered 2018-05-15: 1 via TOPICAL
  Filled 2018-05-14: qty 1

## 2018-05-14 MED ORDER — INSULIN GLARGINE 100 UNIT/ML ~~LOC~~ SOLN
20.0000 [IU] | Freq: Every day | SUBCUTANEOUS | Status: DC
  Administered 2018-05-14 – 2018-05-18 (×5): 20 [IU] via SUBCUTANEOUS
  Filled 2018-05-14 (×6): qty 0.2

## 2018-05-14 MED ORDER — INSULIN ASPART 100 UNIT/ML ~~LOC~~ SOLN
0.0000 [IU] | SUBCUTANEOUS | Status: DC
  Administered 2018-05-14: 7 [IU] via SUBCUTANEOUS
  Administered 2018-05-15 (×4): 4 [IU] via SUBCUTANEOUS
  Administered 2018-05-15: 3 [IU] via SUBCUTANEOUS
  Administered 2018-05-16: 4 [IU] via SUBCUTANEOUS
  Administered 2018-05-16: 3 [IU] via SUBCUTANEOUS
  Administered 2018-05-16: 4 [IU] via SUBCUTANEOUS
  Administered 2018-05-16 – 2018-05-17 (×3): 3 [IU] via SUBCUTANEOUS
  Administered 2018-05-17 (×2): 4 [IU] via SUBCUTANEOUS
  Administered 2018-05-17 (×2): 3 [IU] via SUBCUTANEOUS
  Administered 2018-05-18 (×3): 4 [IU] via SUBCUTANEOUS
  Administered 2018-05-19 – 2018-05-20 (×2): 3 [IU] via SUBCUTANEOUS

## 2018-05-14 NOTE — Progress Notes (Signed)
NAME:  Patricia Mata, MRN:  [id number], DOB:  17-Nov-1955, LOS: 3 ADMISSION DATE:  05/11/2018, CONSULTATION DATE:  10/28 REFERRING MD:  EDP, CHIEF COMPLAINT:  AFib RVR    Brief History   62yo female with hx severe AS, severe TR, severe pulmonary HTN, stage 3 colon cancer and myelodysplastic syndrome and anemia requiring frequent transfusions presented 10/28 with dyspnea found to have AFib with RVR, acute on chronic anemia and AKI.  Cardiology requesting ICU admission per PCCM.  She developed severe metabolic acidosis and shock requiring mechanical ventilation  Past Medical History  Severe AS she Severe TR Severe pulmonary hypertension Stage III colon cancer Myelodysplastic syndrome-diagnosed 2017 Chronic microcytic anemia Hypertension Diabetes Significant Hospital Events   10/29 early am  Intubated emergently>> suspected sepsis>>severe  metabolic acidosis 19/50 CRRT  Consults: date of consult/date signed off & final recs:  Cardiology 10/28  Procedures (surgical and bedside):  10/29>> ETT>> 10/29>> RIJ CVC>> 10/29>> A Line 10/29>> LIJ HD cath  Significant Diagnostic Tests:  10/25 CT abdomen and pelvis Heterogeneous hepatic parenchyma which is nonspecific however may potentially be secondary to hepatic venous congestion. Small amount of perihepatic ascites and free fluid in the pelvis.    Micro Data:  10/29>> Blood ng 10/31>> Urine 10/29>> Sputum >>  Antimicrobials:  10/29 Merrem >> 10/29 Vanc >> 10/31  Subjective:   Remains critically ill, orally intubated, on CRRT Off levo fed now Fentanyl drip being lightened up for wake-up assessment.   Objective   Blood pressure 122/62, pulse 81, temperature 97.7 F (36.5 C), resp. rate (!) 30, height 5\' 4"  (1.626 m), weight 98.7 kg, SpO2 100 %. CVP:  [7 mmHg-11 mmHg] 7 mmHg  Vent Mode: PRVC FiO2 (%):  [40 %] 40 % Set Rate:  [30 bmp] 30 bmp Vt Set:  [490 mL] 490 mL PEEP:  [5 cmH20] 5 cmH20 Pressure Support:  [5 cmH20]  5 cmH20 Plateau Pressure:  [20 cmH20-22 cmH20] 22 cmH20   Intake/Output Summary (Last 24 hours) at 05/14/2018 9326 Last data filed at 05/14/2018 0900 Gross per 24 hour  Intake 2716.75 ml  Output 3225 ml  Net -508.25 ml   Filed Weights   05/12/18 1200 05/13/18 0400 05/14/18 0600  Weight: (S) 101.2 kg 101.4 kg 98.7 kg    Sedated on low-dose fentanyl drip, on CRRT, acutely ill Pallor present, no icterus No JVD, 1+ edema Clear breath sounds bilateral, no rhonchi Ejection systolic murmur 2/6 at base, S1-S2 normal Soft nontender abdomen RA SS -2, does not follow commands  Chest x-ray from 10/30 was personally reviewed which shows bilateral hilar infiltrates, no effusions?  Edema versus aspiration  Resolved Hospital Problem list     Assessment & Plan:  Cardiogenic shock  Plan: Levophed off, goal SBP 100 & above long QTc, avoid QT prolonging drugs  A. fib with RVR>> rate now controlled and in Sinus Rhythm Plan- If recurs, consider amio Holding on heparin given significant anemia Can restart home metoprolol if blood pressure improves   Severe aortic stenosis Severe TR Severe pulmonary hypertension - PA peak pressure >167mmHg Hx HTN Plan- Cardiology following Not a candidate for TAVR/ balloon Valvuloplasty at this time, will need to extubate and make our before being considered for valve replacement    AKI  Severe metabolic acidosis Plan CRRT , lower ultrafiltrate since she is starting to make some urine   Acute on chronic anemia s/p 4u PRBC so far Myelodysplastic syndrome Plan- Goal hemoglobin greater than 8 Follow-up CBC  Hypomagnesemia Hyperkalemia Plan- resolved  DM-2, uncontrolled  Plan- SSI Increase lantus to 20 U daily  Disposition / Summary of Today's Plan 05/14/18    Appears to have improved somewhat, off pressors and starting to make urine. Decrease ultrafiltration CRRT, lighten up sedation and start spontaneous breathing trials with  goal extubation      Code Status: Full code Family Communication: Family updated daily at bedside   The patient is critically ill with multiple organ systems failure and requires high complexity decision making for assessment and support, frequent evaluation and titration of therapies, application of advanced monitoring technologies and extensive interpretation of multiple databases. Critical Care Time devoted to patient care services described in this note independent of APP/resident  time is 35 minutes.   Kara Mead MD. [place] Flood. [facility] Pager [phone] If no response call [phone]    05/14/2018 9:22 AM

## 2018-05-14 NOTE — Progress Notes (Signed)
CRITICAL VALUE ALERT  Critical Value:  Phosphorus <1.0  Date & Time Notied:  05/14/2018 at 0612  Provider Notified: Genevive Bi, MD  Orders Received/Actions taken: replace phosphorus. Please see MAR

## 2018-05-14 NOTE — Progress Notes (Addendum)
[facility] Progress Note    Assessment/ Plan:   62 y.o.femalewith history of stage III colon cancer,MDS dx 2017,chronic macrocytic anemia requiring frequent transfusions, DM, HTN, severeAS, severe TR, severe pulmonary hypertension with recent echocardiogram showing RVSP 112 mmHg here with progressive dyspnea found to have AKI andA. fib with RVR w/ AKI;  cr risen from 1.05 to 3.37. Eventually intubated for sepsis + metabolic acidosis with a pH of 6.93 + hyperkalemia (K5.4). She also had hypotension req Levophed w/ CXR showing either edema vs PNA. She has had no UOP since admission.  1. Acute Kidney Injury from hypoperfusion + sepsis and currently essentially anuric. Patient almost certainly in ATN and may be in for a protracted course if she recovers. Anuria is not a good sign and her cardiac status is concerning as well with the severe AS and pulm HTN.  - Dose meds for CRRT; phos being replaced  - Currently on 4K bath - Let's give a trial of UF 82m/hr; currently net even with e/o volume overload. Currently off Levophed. - Check bladder scan as well -> foley if >3065m  2. Afib RVR - rate controlled now off Cardizem 3. Severe AS and pulm HTN - not stable for more definitive treatments. Per cards no valvulopasty fow now; TAVR only when off CRRT with improvement in overall status. 4. H/o MDS and colon CA 5. DM  Subjective:   Intubated.  Filter clotted once off heparin when RRT 1st started but since then has been on heparin; filter has not clotted overnight..  [place] 4k bath Pre/post/qd 500/300/1500 UF net even overnight    Objective:   BP (!) 103/53 (BP Location: Left Arm)   Pulse 76   Temp 97.7 F (36.5 C) (Esophageal)   Resp (!) 30   Ht '5\' 4"'  (1.626 m)   Wt 98.7 kg   SpO2 100%   BMI 37.35 kg/m   Intake/Output Summary (Last 24 hours) at 05/14/2018 0827 Last data filed at 05/14/2018 0800 Gross per 24 hour  Intake  2671.92 ml  Output 3163 ml  Net -491.08 ml   Weight change: -2.5 kg  Physical Exam: General appearance:NAD, slowed mentation Head:NCAT Eyes:grimacing, not opening eyes today Resp:intubated Cardio:irregularly irregular rhythm GIGE:ZMOQS but soft Extremities:edemapresent 1+  Imaging: [place]  Result Date: 05/12/2018 CLINICAL DATA:  Acute renal failure. EXAM: RENAL / URINARY TRACT ULTRASOUND COMPLETE COMPARISON:  CT 05/08/2017 FINDINGS: Right Kidney: Length: 9.7 cm ,height: 5.9 cm, width: 5.8 cm volume: 172.9 cc. Echogenicity within normal limits. No mass or hydronephrosis visualized. Left Kidney: Length: 11.6 cm, height 6.0 cm, width 5.9 cm, volume 215.0 cc echogenicity within normal limits. No mass or hydronephrosis visualized. Bladder: Appears normal for degree of bladder distention. IMPRESSION: Normal exam. Electronically Signed   By: ThMarcello MooresRegister   On: 05/12/2018 12:10   Dg Chest Port 1 View  Result Date: 05/13/2018 CLINICAL DATA:  Respiratory failure, intubated patient, aortic stenosis, acute kidney injury. EXAM: PORTABLE CHEST 1 VIEW COMPARISON:  Portable chest x-ray of May 12, 2018 FINDINGS: The lungs are well-expanded. The interstitial markings are increased especially at the lung bases. This is more conspicuous today. The heart is not enlarged. The pulmonary vascularity is not engorged. The endotracheal tube tip projects 5.2 cm above the carina. The esophagogastric tube tip projects below the inferior margin of the image with the proximal port above the GE junction. The dialysis catheter tip via the left internal jugular approach projects over  the proximal SVC. A power port catheter tip projects over the midportion of the SVC. A right internal jugular venous catheter tip projects over the junction of the middle and distal thirds of the SVC. IMPRESSION: Increasing interstitial infiltrate in the infrahilar regions bilaterally greatest on the right worrisome for  pneumonia. No large pleural effusion. No pulmonary edema. Thoracic aortic atherosclerosis. Advancement of the esophagogastric tube by 5 cm would assure that the proximal port is positioned below the GE junction. The other support tubes and lines are in reasonable position. Electronically Signed   By: David  [name] M.D.   On: 05/13/2018 10:49   Dg Chest Port 1 View  Result Date: 05/12/2018 CLINICAL DATA:  New central line placement EXAM: PORTABLE CHEST 1 VIEW COMPARISON:  Portable chest x-ray of 05/12/2017 and 05/11/2017 FINDINGS: The tip of the endotracheal tube is approximately 4.1 cm above the carina. Right Port-A-Cath and right IJ central venous line tips overlie each other both within the mid lower SVC. A new left IJ central venous line has been inserted with the tip near the lateral wall of the mid SVC. No pneumothorax is seen. There is minimal haziness in the left mid lung and follow-up is recommended. Cardiomegaly is stable. IMPRESSION: 1. New left IJ central venous line tip overlies the lateral wall of the mid SVC. No pneumothorax. 2. Endotracheal tube tip 4.1 cm above the carina. 3. No change in right Port-A-Cath tip and right IJ central venous line both of which are in the mid lower SVC. 4. Some haziness in the left mid lung. Recommend follow-up chest x-ray. Electronically Signed   By: Ivar Drape M.D.   On: 05/12/2018 11:49    Labs: BMET Recent Labs  Lab 05/11/18 0650 05/12/18 0300 05/12/18 1625 05/13/18 0333 05/13/18 1557 05/14/18 0404  NA 130* 133* 137 138 136 137  K 4.4 5.4* 3.9 3.3* 3.1* 3.4*  CL 97* 100 96* 100 103 105  CO2 8* <7* 12* 19* 21* 24  GLUCOSE 164* 114* 195* 212* 246* 251*  BUN 48* 58* 52* 43* 30* 22  CREATININE 3.37* 3.94* 3.61* 2.82* 1.87* 1.47*  CALCIUM 8.8* 8.4* 8.1* 7.9* 7.5* 7.7*  PHOS  --  10.1* 7.2* 2.4* 1.1* <1.0*   CBC Recent Labs  Lab 05/11/18 0650  05/12/18 1828 05/13/18 0333 05/13/18 1557 05/14/18 0404  WBC 5.7   < > 9.8 8.9 6.2 5.5   NEUTROABS 3.7  --   --   --   --   --   HGB 6.3*   < > 7.8* 7.9* 7.1* 7.5*  HCT 19.9*   < > 23.2* 22.0* 20.1* 22.1*  MCV 97.1   < > 87.9 84.6 85.2 87.0  PLT 257   < > 163 156 128* 126*   < > = values in this interval not displayed.    Medications:    . sodium chloride   Intravenous Once  . sodium chloride   Intravenous Once  . sodium chloride   Intravenous Once  . chlorhexidine gluconate (MEDLINE KIT)  15 mL Mouth Rinse BID  . Chlorhexidine Gluconate Cloth  6 each Topical Daily  . insulin aspart  0-15 Units Subcutaneous Q4H  . insulin glargine  10 Units Subcutaneous Daily  . latanoprost  1 drop Both Eyes QHS  . mouth rinse  15 mL Mouth Rinse 10 times per day  . midazolam  2 mg Intravenous Once  . pantoprazole sodium  40 mg Per Tube Daily  . sodium chloride flush  10-40 mL Intracatheter Q12H      Otelia Santee, MD 05/14/2018, 8:27 AM

## 2018-05-14 NOTE — Progress Notes (Signed)
Aware of mildly elevated troponin, however, it is lower than the previous troponins on admission. Agree with holding on IV heparin for now, however, if the next troponin is higher, would heparinize in the setting of her EKG changes.  Pixie Casino, MD, [facility], [place] Director of the Advanced Lipid Disorders &  Cardiovascular Risk Reduction Clinic Diplomate of the American Board of Clinical Lipidology Attending Cardiologist  Direct Dial: [phone]  Fax: [phone]  Website:  [email or web address]

## 2018-05-14 NOTE — Progress Notes (Signed)
CRITICAL VALUE ALERT  Critical Value:  Troponin 0.15  Date & Time Notied:  05/14/2018 1615  Provider Notified: Elsworth Soho, MD  Orders Received/Actions taken: none  Also notified of continued high CBGs. Verbal order to modify SSI to sensitive scale.

## 2018-05-14 NOTE — Progress Notes (Signed)
Inpatient Diabetes Program Recommendations  AACE/ADA: New Consensus Statement on Inpatient Glycemic Control (2015)  Target Ranges:  Prepandial:   less than 140 mg/dL      Peak postprandial:   less than 180 mg/dL (1-2 hours)      Critically ill patients:  140 - 180 mg/dL   Lab Results  Component Value Date   GLUCAP 266 (H) 05/14/2018   HGBA1C 11.6 (H) 06/14/2017      Results for CAYLEN, KUWAHARA (MRN [id number]) as of 05/14/2018 15:12  Ref. Range 05/13/2018 20:03 05/14/2018 00:01 05/14/2018 04:08 05/14/2018 07:39 05/14/18 05/14/2018 11:12  Glucose-Capillary Latest Ref Range: 70 - 99 mg/dL 208 (H)  Novolog 5 units  131 (H)  Novolog 2 units  241 (H)  Novolog 5 units  255 (H)  Novolog 8 units  Lantus 20 units given  266 (H)  Novolog 8 units    Results for LATASH, NOURI (MRN [id number]) as of 05/14/2018 15:12  Ref. Range 05/13/2018 08:24 05/13/2018 11:07 05/13/2018 15:57 05/13/18 1721 05/13/2018 20:03  Glucose-Capillary Latest Ref Range: 70 - 99 mg/dL 222 (H)  Novolog 5 units 218 (H)  Novolog 5 units 230 (H)  Novolog 5 units   Lantus 10 units given  208 (H)  Novolog 5 units    Current inpatient DM meds:  Lantus 20 units daily (increased today from 10 units/day yesterday) Novolog (0-15 units) Q4   Concerned about potential hypoglycemia due to increased Lantus if tube feeding is stopped. Treating the increased blood sugars with Novolog (specifically tube feed coverage) would less likely lead to lows if / when tube feeding it stopped.    MD please consider the following inpatient diabetes recommendations:  1. IF Lantus is decreased back to 10 units daily THEN consider adding Novolog 4 units Q4 hours tube feeding coverage.  2. Change to ICU glycemic control order set.    Thank you.  -- Will follow during hospitalization.--  Jonna Clark RN, MSN Diabetes Coordinator Inpatient Glycemic Control Team Team Pager: [phone] (8am-5pm)

## 2018-05-14 NOTE — Progress Notes (Signed)
CRITICAL VALUE ALERT  Critical Value:  Troponin 0.15  Date & Time Notied:  05/14/2018 1017  Provider Notified: Elsworth Soho, MD  Orders Received/Actions taken: none

## 2018-05-14 NOTE — Progress Notes (Signed)
CRITICAL VALUE ALERT  Critical Value:  APTT 251  Date & Time Notied:  05/14/2018 at 0612  Provider Notified: Genevive Bi, MD   Orders Received/Actions taken: No new orders at this time

## 2018-05-14 NOTE — Progress Notes (Signed)
DAILY PROGRESS NOTE   Patient Name: Patricia Mata Date of Encounter: 05/14/2018  Chief Complaint   Intubated, sedated on vent  Patient Profile   Patricia Mata is a 62 y.o. female with a hx of severe AS and mild to mod, MS but same as 05/2017 and severe pulmonary hypertension who is being seen today for the evaluation of a fib RVR at the request of Dr. Tyrone Nine. Admitted to ICU and developed hypotension and hypothermia concerning for sepsis.  Subjective   Off pressors. MAP 65. Rhythm stable overnight. Bladder scan showed 400 cc urine. QTc has improved today to 555 msec, however, there are new anterolateral deep TWI's which have been evolving over the past several days. Noted to be hypophosphatemic. May be electrolyte related, however, cannot exclude ischemia since she is sedated on fentanyl.  Objective   Vitals:   05/14/18 0730 05/14/18 0736 05/14/18 0745 05/14/18 0800  BP: (!) 148/92 (!) 148/92 (!) 102/45 (!) 103/53  Pulse: 84 77 71 76  Resp: (!) 30 (!) 30 (!) 30 (!) 30  Temp: 97.7 F (36.5 C)  (!) 97.5 F (36.4 C) 97.7 F (36.5 C)  TempSrc:    Esophageal  SpO2: 100% 100% 100% 100%  Weight:      Height:        Intake/Output Summary (Last 24 hours) at 05/14/2018 7673 Last data filed at 05/14/2018 0800 Gross per 24 hour  Intake 2671.92 ml  Output 3163 ml  Net -491.08 ml   Filed Weights   05/12/18 1200 05/13/18 0400 05/14/18 0600  Weight: (S) 101.2 kg 101.4 kg 98.7 kg    Physical Exam   General appearance: intubated, lightly sedated on vent, opens eyes spontaneously Neck: no carotid bruit, no JVD and thyroid not enlarged, symmetric, no tenderness/mass/nodules Lungs: diminished breath sounds bilaterally Heart: regular rate and rhythm Abdomen: soft, non-tender; bowel sounds normal; no masses,  no organomegaly Extremities: extremities normal, atraumatic, no cyanosis or edema Pulses: 2+ and symmetric Skin: Skin color, texture, turgor normal. No rashes or  lesions Neurologic: Mental status: intubated, sedated on vent, opens eyes Psych: Cannot assess   Inpatient Medications    Scheduled Meds: . sodium chloride   Intravenous Once  . sodium chloride   Intravenous Once  . sodium chloride   Intravenous Once  . chlorhexidine gluconate (MEDLINE KIT)  15 mL Mouth Rinse BID  . Chlorhexidine Gluconate Cloth  6 each Topical Daily  . insulin aspart  0-15 Units Subcutaneous Q4H  . insulin glargine  10 Units Subcutaneous Daily  . latanoprost  1 drop Both Eyes QHS  . mouth rinse  15 mL Mouth Rinse 10 times per day  . midazolam  2 mg Intravenous Once  . pantoprazole sodium  40 mg Per Tube Daily  . sodium chloride flush  10-40 mL Intracatheter Q12H    Continuous Infusions: . sodium chloride 10 mL/hr at 05/13/18 2135  . feeding supplement (VITAL HIGH PROTEIN) 1,000 mL (05/13/18 1258)  . fentaNYL infusion INTRAVENOUS 125 mcg/hr (05/14/18 0800)  . heparin 10,000 units/ 20 mL infusion syringe 1,600 Units/hr (05/14/18 0732)  . meropenem (MERREM) IV Stopped (05/14/18 4193)  . norepinephrine (LEVOPHED) Adult infusion Stopped (05/14/18 0201)  . potassium PHOSPHATE IVPB (in mmol) 85 mL/hr at 05/14/18 0800  . prismasol BGK 4/2.5 500 mL/hr at 05/14/18 0259  . prismasol BGK 4/2.5 300 mL/hr at 05/14/18 7902  . prismasol BGK 4/2.5 1,500 mL/hr at 05/14/18 0617  . sodium chloride    . sodium chloride  PRN Meds: sodium chloride, fentaNYL, heparin, heparin, midazolam, midazolam, sodium chloride, sodium chloride, sodium chloride flush   Labs   Results for orders placed or performed during the hospital encounter of 05/11/18 (from the past 48 hour(s))  I-STAT 3, arterial blood gas (G3+)     Status: Abnormal   Collection Time: 05/12/18  8:32 AM  Result Value Ref Range   pH, Arterial 7.253 (L) 7.350 - 7.450   pCO2 arterial 15.9 (LL) 32.0 - 48.0 mmHg   pO2, Arterial 241.0 (H) 83.0 - 108.0 mmHg   Bicarbonate 7.1 (L) 20.0 - 28.0 mmol/L   TCO2 8 (L) 22 - 32  mmol/L   O2 Saturation 100.0 %   Acid-base deficit 18.0 (H) 0.0 - 2.0 mmol/L   Patient temperature 36.3 C    Collection site ARTERIAL LINE    Drawn by Nurse    Sample type ARTERIAL    Comment NOTIFIED PHYSICIAN   Glucose, capillary     Status: Abnormal   Collection Time: 05/12/18  8:35 AM  Result Value Ref Range   Glucose-Capillary 124 (H) 70 - 99 mg/dL   Comment 1 Arterial Specimen   I-STAT 3, arterial blood gas (G3+)     Status: Abnormal   Collection Time: 05/12/18  8:51 AM  Result Value Ref Range   pH, Arterial 7.223 (L) 7.350 - 7.450   pCO2 arterial 18.5 (LL) 32.0 - 48.0 mmHg   pO2, Arterial 240.0 (H) 83.0 - 108.0 mmHg   Bicarbonate 7.7 (L) 20.0 - 28.0 mmol/L   TCO2 8 (L) 22 - 32 mmol/L   O2 Saturation 100.0 %   Acid-base deficit 18.0 (H) 0.0 - 2.0 mmol/L   Patient temperature 36.5 C    Collection site ARTERIAL LINE    Drawn by Operator    Sample type ARTERIAL    Comment NOTIFIED PHYSICIAN   Lactic acid, plasma     Status: Abnormal   Collection Time: 05/12/18 10:12 AM  Result Value Ref Range   Lactic Acid, Venous 17.7 (HH) 0.5 - 1.9 mmol/L    Comment: RESULTS CONFIRMED BY MANUAL DILUTION CRITICAL RESULT CALLED TO, READ BACK BY AND VERIFIED WITH: COOK,T RN @ 4034 05/12/18 LEONARD,A Performed at [facility], [address] [place], [place] [address]   Glucose, capillary     Status: Abnormal   Collection Time: 05/12/18 11:22 AM  Result Value Ref Range   Glucose-Capillary 126 (H) 70 - 99 mg/dL   Comment 1 Arterial Specimen   Culture, respiratory (non-expectorated)     Status: None (Preliminary result)   Collection Time: 05/12/18 11:34 AM  Result Value Ref Range   Specimen Description TRACHEAL ASPIRATE    Special Requests Immunocompromised    Gram Stain      MODERATE WBC PRESENT, PREDOMINANTLY PMN RARE GRAM POSITIVE COCCI RARE GRAM POSITIVE RODS    Culture      CULTURE REINCUBATED FOR BETTER GROWTH Performed at [facility], [place] [address]  [place], [place] [address]    Report Status PENDING   CBC     Status: Abnormal   Collection Time: 05/12/18  1:02 PM  Result Value Ref Range   WBC 15.9 (H) 4.0 - 10.5 K/uL   RBC 2.30 (L) 3.87 - 5.11 MIL/uL   Hemoglobin 6.8 (LL) 12.0 - 15.0 g/dL    Comment: REPEATED TO VERIFY THIS CRITICAL RESULT HAS VERIFIED AND BEEN CALLED TO TREVOR COOK,RN BY ZELDA BEECH ON 10 29 2019 AT 1357, AND HAS BEEN  READ BACK. CRITICAL VALVE VERIFIED    HCT 21.5 (L) 36.0 - 46.0 %   MCV 93.5 80.0 - 100.0 fL    Comment: REPEATED TO VERIFY POST TRANSFUSION SPECIMEN    MCH 29.6 26.0 - 34.0 pg   MCHC 31.6 30.0 - 36.0 g/dL   RDW 18.2 (H) 11.5 - 15.5 %   Platelets 204 150 - 400 K/uL   nRBC 0.4 (H) 0.0 - 0.2 %    Comment: Performed at [place] [address], [place], [place] [address]  Lactic acid, plasma     Status: Abnormal   Collection Time: 05/12/18  1:07 PM  Result Value Ref Range   Lactic Acid, Venous 14.2 (HH) 0.5 - 1.9 mmol/L    Comment: RESULTS CONFIRMED BY MANUAL DILUTION CRITICAL RESULT CALLED TO, READ BACK BY AND VERIFIED WITHAgnes Lawrence RN 1441 05/12/2018 BY A BENNETT Performed at [facility], [place] [name], [place], [place] [address]   Prepare RBC     Status: None   Collection Time: 05/12/18  2:43 PM  Result Value Ref Range   Order Confirmation      ORDER PROCESSED BY BLOOD BANK Performed at [facility], [place] [address], [place], [place] [address]   Prepare RBC     Status: None   Collection Time: 05/12/18  3:18 PM  Result Value Ref Range   Order Confirmation      BB SAMPLE OR UNITS ALREADY AVAILABLE Performed at [facility], [address] [place], [place] [address]   I-STAT 3, arterial blood gas (G3+)     Status: Abnormal   Collection Time: 05/12/18  3:24 PM  Result Value Ref Range   pH, Arterial 7.428 7.350 - 7.450   pCO2 arterial 17.3 (LL) 32.0 - 48.0 mmHg   pO2, Arterial 249.0 (H) 83.0 - 108.0 mmHg   Bicarbonate 11.5 (L) 20.0 - 28.0 mmol/L   TCO2 12 (L)  22 - 32 mmol/L   O2 Saturation 100.0 %   Acid-base deficit 12.0 (H) 0.0 - 2.0 mmol/L   Patient temperature 36.2 C    Collection site ARTERIAL LINE    Drawn by Nurse    Sample type ARTERIAL    Comment NOTIFIED PHYSICIAN   Renal function panel (daily at 1600)     Status: Abnormal   Collection Time: 05/12/18  4:25 PM  Result Value Ref Range   Sodium 137 135 - 145 mmol/L   Potassium 3.9 3.5 - 5.1 mmol/L    Comment: DELTA CHECK NOTED   Chloride 96 (L) 98 - 111 mmol/L   CO2 12 (L) 22 - 32 mmol/L   Glucose, Bld 195 (H) 70 - 99 mg/dL   BUN 52 (H) 8 - 23 mg/dL   Creatinine, Ser 3.61 (H) 0.44 - 1.00 mg/dL   Calcium 8.1 (L) 8.9 - 10.3 mg/dL   Phosphorus 7.2 (H) 2.5 - 4.6 mg/dL   Albumin 3.4 (L) 3.5 - 5.0 g/dL   GFR calc non Af Amer 12 (L) >60 mL/min   GFR calc Af Amer 14 (L) >60 mL/min    Comment: (NOTE) The eGFR has been calculated using the CKD EPI equation. This calculation has not been validated in all clinical situations. eGFR's persistently <60 mL/min signify possible Chronic Kidney Disease.    Anion gap 29 (H) 5 - 15    Comment: Performed at [facility], [place] [address] [place], [place] [address]  Glucose, capillary  Status: Abnormal   Collection Time: 05/12/18  4:31 PM  Result Value Ref Range   Glucose-Capillary 180 (H) 70 - 99 mg/dL  CBC     Status: Abnormal   Collection Time: 05/12/18  6:28 PM  Result Value Ref Range   WBC 9.8 4.0 - 10.5 K/uL   RBC 2.64 (L) 3.87 - 5.11 MIL/uL   Hemoglobin 7.8 (L) 12.0 - 15.0 g/dL   HCT 23.2 (L) 36.0 - 46.0 %   MCV 87.9 80.0 - 100.0 fL   MCH 29.5 26.0 - 34.0 pg   MCHC 33.6 30.0 - 36.0 g/dL   RDW 16.9 (H) 11.5 - 15.5 %   Platelets 163 150 - 400 K/uL   nRBC 0.5 (H) 0.0 - 0.2 %    Comment: Performed at [facility], [place] [address], [place], [place] [address]  Glucose, capillary     Status: Abnormal   Collection Time: 05/12/18  7:43 PM  Result Value Ref Range   Glucose-Capillary 185 (H) 70 - 99 mg/dL  Lactic acid,  plasma     Status: Abnormal   Collection Time: 05/12/18  8:26 PM  Result Value Ref Range   Lactic Acid, Venous 6.0 (HH) 0.5 - 1.9 mmol/L    Comment: CRITICAL RESULT CALLED TO, READ BACK BY AND VERIFIED WITH: Royden Purl [facility] 05/12/18 2156 WAYK Performed at [facility], [place] [address], [place], [place] [address]   Glucose, capillary     Status: Abnormal   Collection Time: 05/13/18 12:12 AM  Result Value Ref Range   Glucose-Capillary 204 (H) 70 - 99 mg/dL  CBC     Status: Abnormal   Collection Time: 05/13/18  3:33 AM  Result Value Ref Range   WBC 8.9 4.0 - 10.5 K/uL   RBC 2.60 (L) 3.87 - 5.11 MIL/uL   Hemoglobin 7.9 (L) 12.0 - 15.0 g/dL   HCT 22.0 (L) 36.0 - 46.0 %   MCV 84.6 80.0 - 100.0 fL   MCH 30.4 26.0 - 34.0 pg   MCHC 35.9 30.0 - 36.0 g/dL   RDW 16.4 (H) 11.5 - 15.5 %   Platelets 156 150 - 400 K/uL   nRBC 0.7 (H) 0.0 - 0.2 %    Comment: Performed at [facility], [place] [address], [place], [place] [address]  Comprehensive metabolic panel     Status: Abnormal   Collection Time: 05/13/18  3:33 AM  Result Value Ref Range   Sodium 138 135 - 145 mmol/L   Potassium 3.3 (L) 3.5 - 5.1 mmol/L   Chloride 100 98 - 111 mmol/L   CO2 19 (L) 22 - 32 mmol/L   Glucose, Bld 212 (H) 70 - 99 mg/dL   BUN 43 (H) 8 - 23 mg/dL   Creatinine, Ser 2.82 (H) 0.44 - 1.00 mg/dL   Calcium 7.9 (L) 8.9 - 10.3 mg/dL   Total Protein 5.7 (L) 6.5 - 8.1 g/dL   Albumin 3.0 (L) 3.5 - 5.0 g/dL   AST 514 (H) 15 - 41 U/L   ALT 204 (H) 0 - 44 U/L   Alkaline Phosphatase 100 38 - 126 U/L   Total Bilirubin 5.5 (H) 0.3 - 1.2 mg/dL   GFR calc non Af Amer 17 (L) >60 mL/min   GFR calc Af Amer 19 (L) >60 mL/min    Comment: (NOTE) The eGFR has been calculated using the CKD EPI equation. This calculation has not been validated in all clinical situations. eGFR's persistently <60 mL/min  signify possible Chronic Kidney Disease.    Anion gap 19 (H) 5 - 15    Comment: Performed at [facility], [place]  [address], [place], [place] [address]  Lactic acid, plasma     Status: Abnormal   Collection Time: 05/13/18  3:33 AM  Result Value Ref Range   Lactic Acid, Venous 3.9 (HH) 0.5 - 1.9 mmol/L    Comment: CRITICAL RESULT CALLED TO, READ BACK BY AND VERIFIED WITH: [address] 05/13/18 [place] Performed at [place] [address], [place], [place] [address]   Brain natriuretic peptide     Status: Abnormal   Collection Time: 05/13/18  3:33 AM  Result Value Ref Range   B Natriuretic Peptide 3,571.3 (H) 0.0 - 100.0 pg/mL    Comment: Performed at [place] [address], [place], [place] [address]  Magnesium     Status: None   Collection Time: 05/13/18  3:33 AM  Result Value Ref Range   Magnesium 2.0 1.7 - 2.4 mg/dL    Comment: Performed at [place] [address], [place], [place] [address]  Phosphorus     Status: Abnormal   Collection Time: 05/13/18  3:33 AM  Result Value Ref Range   Phosphorus 2.4 (L) 2.5 - 4.6 mg/dL    Comment: Performed at [place] [address] [place], [place] [address]  Glucose, capillary     Status: Abnormal   Collection Time: 05/13/18  3:36 AM  Result Value Ref Range   Glucose-Capillary 217 (H) 70 - 99 mg/dL  Glucose, capillary     Status: Abnormal   Collection Time: 05/13/18  8:24 AM  Result Value Ref Range   Glucose-Capillary 222 (H) 70 - 99 mg/dL   Comment 1 Arterial Specimen   POCT Activated clotting time     Status: None   Collection Time: 05/13/18  8:49 AM  Result Value Ref Range   Activated Clotting Time 136 seconds  POCT Activated clotting time     Status: None   Collection Time: 05/13/18  9:52 AM  Result Value Ref Range   Activated Clotting Time 147 seconds  POCT Activated clotting time     Status: None   Collection Time: 05/13/18 10:48 AM  Result Value Ref Range   Activated Clotting Time 158 seconds  Glucose, capillary     Status: Abnormal   Collection Time: 05/13/18 11:07 AM  Result Value Ref Range    Glucose-Capillary 218 (H) 70 - 99 mg/dL   Comment 1 Arterial Specimen   POCT Activated clotting time     Status: None   Collection Time: 05/13/18 11:47 AM  Result Value Ref Range   Activated Clotting Time 164 seconds  POCT Activated clotting time     Status: None   Collection Time: 05/13/18 12:52 PM  Result Value Ref Range   Activated Clotting Time 164 seconds  POCT Activated clotting time     Status: None   Collection Time: 05/13/18  1:48 PM  Result Value Ref Range   Activated Clotting Time 169 seconds  POCT Activated clotting time     Status: None   Collection Time: 05/13/18  2:54 PM  Result Value Ref Range   Activated Clotting Time 169 seconds  POCT Activated clotting time     Status: None   Collection Time: 05/13/18  3:55 PM  Result Value Ref Range   Activated Clotting Time 175 seconds  CBC  Status: Abnormal   Collection Time: 05/13/18  3:57 PM  Result Value Ref Range   WBC 6.2 4.0 - 10.5 K/uL   RBC 2.36 (L) 3.87 - 5.11 MIL/uL   Hemoglobin 7.1 (L) 12.0 - 15.0 g/dL   HCT 20.1 (L) 36.0 - 46.0 %   MCV 85.2 80.0 - 100.0 fL   MCH 30.1 26.0 - 34.0 pg   MCHC 35.3 30.0 - 36.0 g/dL   RDW 16.2 (H) 11.5 - 15.5 %   Platelets 128 (L) 150 - 400 K/uL   nRBC 0.6 (H) 0.0 - 0.2 %    Comment: Performed at [place] [address], [place], [place] [address]  Renal function panel (daily at 1600)     Status: Abnormal   Collection Time: 05/13/18  3:57 PM  Result Value Ref Range   Sodium 136 135 - 145 mmol/L   Potassium 3.1 (L) 3.5 - 5.1 mmol/L   Chloride 103 98 - 111 mmol/L   CO2 21 (L) 22 - 32 mmol/L   Glucose, Bld 246 (H) 70 - 99 mg/dL   BUN 30 (H) 8 - 23 mg/dL   Creatinine, Ser 1.87 (H) 0.44 - 1.00 mg/dL   Calcium 7.5 (L) 8.9 - 10.3 mg/dL   Phosphorus 1.1 (L) 2.5 - 4.6 mg/dL   Albumin 2.5 (L) 3.5 - 5.0 g/dL   GFR calc non Af Amer 28 (L) >60 mL/min   GFR calc Af Amer 32 (L) >60 mL/min    Comment: (NOTE) The eGFR has been calculated using the CKD EPI equation. This  calculation has not been validated in all clinical situations. eGFR's persistently <60 mL/min signify possible Chronic Kidney Disease.    Anion gap 12 5 - 15    Comment: Performed at [place] [address], [place], [place] [address]  Glucose, capillary     Status: Abnormal   Collection Time: 05/13/18  3:57 PM  Result Value Ref Range   Glucose-Capillary 230 (H) 70 - 99 mg/dL   Comment 1 Arterial Specimen   POCT Activated clotting time     Status: None   Collection Time: 05/13/18  4:56 PM  Result Value Ref Range   Activated Clotting Time 175 seconds  POCT Activated clotting time     Status: None   Collection Time: 05/13/18  5:51 PM  Result Value Ref Range   Activated Clotting Time 180 seconds  POCT Activated clotting time     Status: None   Collection Time: 05/13/18  6:54 PM  Result Value Ref Range   Activated Clotting Time 169 seconds  Prepare RBC     Status: None   Collection Time: 05/13/18  7:37 PM  Result Value Ref Range   Order Confirmation      ORDER PROCESSED BY BLOOD BANK Performed at [facility], [place] [address], [place], [place] [address]   Glucose, capillary     Status: Abnormal   Collection Time: 05/13/18  8:03 PM  Result Value Ref Range   Glucose-Capillary 208 (H) 70 - 99 mg/dL   Comment 1 Arterial Specimen   POCT Activated clotting time     Status: None   Collection Time: 05/13/18  8:06 PM  Result Value Ref Range   Activated Clotting Time 180 seconds  POCT Activated clotting time     Status: None   Collection Time: 05/13/18  9:06 PM  Result Value Ref Range   Activated Clotting Time 186 seconds  POCT Activated  clotting time     Status: None   Collection Time: 05/13/18 10:12 PM  Result Value Ref Range   Activated Clotting Time 186 seconds  POCT Activated clotting time     Status: None   Collection Time: 05/13/18 11:13 PM  Result Value Ref Range   Activated Clotting Time 186 seconds  Glucose, capillary     Status: Abnormal   Collection  Time: 05/14/18 12:01 AM  Result Value Ref Range   Glucose-Capillary 131 (H) 70 - 99 mg/dL   Comment 1 Venous Specimen   POCT Activated clotting time     Status: None   Collection Time: 05/14/18 12:20 AM  Result Value Ref Range   Activated Clotting Time 191 seconds  Urinalysis, Routine w reflex microscopic     Status: Abnormal   Collection Time: 05/14/18 12:34 AM  Result Value Ref Range   Color, Urine AMBER (A) YELLOW    Comment: BIOCHEMICALS MAY BE AFFECTED BY COLOR   APPearance HAZY (A) CLEAR   Specific Gravity, Urine 1.017 1.005 - 1.030   pH 5.0 5.0 - 8.0   Glucose, UA NEGATIVE NEGATIVE mg/dL   Hgb urine dipstick NEGATIVE NEGATIVE   Bilirubin Urine NEGATIVE NEGATIVE   Ketones, ur 5 (A) NEGATIVE mg/dL   Protein, ur 100 (A) NEGATIVE mg/dL   Nitrite NEGATIVE NEGATIVE   Leukocytes, UA NEGATIVE NEGATIVE   RBC / HPF 0-5 0 - 5 RBC/hpf   WBC, UA 0-5 0 - 5 WBC/hpf   Bacteria, UA RARE (A) NONE SEEN   Squamous Epithelial / LPF 0-5 0 - 5   Mucus PRESENT    Hyaline Casts, UA PRESENT     Comment: Performed at [facility], [address], [place], [place] [address]  POCT Activated clotting time     Status: None   Collection Time: 05/14/18  1:12 AM  Result Value Ref Range   Activated Clotting Time 186 seconds  POCT Activated clotting time     Status: None   Collection Time: 05/14/18  2:13 AM  Result Value Ref Range   Activated Clotting Time 186 seconds  POCT Activated clotting time     Status: None   Collection Time: 05/14/18  3:16 AM  Result Value Ref Range   Activated Clotting Time 197 seconds  Renal function panel (daily at 0500)     Status: Abnormal   Collection Time: 05/14/18  4:04 AM  Result Value Ref Range   Sodium 137 135 - 145 mmol/L   Potassium 3.4 (L) 3.5 - 5.1 mmol/L   Chloride 105 98 - 111 mmol/L   CO2 24 22 - 32 mmol/L   Glucose, Bld 251 (H) 70 - 99 mg/dL   BUN 22 8 - 23 mg/dL   Creatinine, Ser 1.47 (H) 0.44 - 1.00 mg/dL   Calcium 7.7 (L) 8.9 - 10.3 mg/dL    Phosphorus <1.0 (LL) 2.5 - 4.6 mg/dL    Comment: CRITICAL RESULT CALLED TO, READ BACK BY AND VERIFIED WITH: MILLER,K RN 05/14/2018 0612 JORDANS    Albumin 2.4 (L) 3.5 - 5.0 g/dL   GFR calc non Af Amer 37 (L) >60 mL/min   GFR calc Af Amer 43 (L) >60 mL/min    Comment: (NOTE) The eGFR has been calculated using the CKD EPI equation. This calculation has not been validated in all clinical situations. eGFR's persistently <60 mL/min signify possible Chronic Kidney Disease.    Anion gap 8 5 - 15    Comment: Performed at [place]  [facility], [place] [address], [place], [place] [address]  Magnesium     Status: None   Collection Time: 05/14/18  4:04 AM  Result Value Ref Range   Magnesium 2.1 1.7 - 2.4 mg/dL    Comment: Performed at [place] [address] [place], [place] [address]  APTT     Status: Abnormal   Collection Time: 05/14/18  4:04 AM  Result Value Ref Range   aPTT 193 (HH) 24 - 36 seconds    Comment: REPEATED TO VERIFY        IF BASELINE aPTT IS ELEVATED, SUGGEST PATIENT RISK ASSESSMENT BE USED TO DETERMINE APPROPRIATE ANTICOAGULANT THERAPY. Performed at [facility], [place] [address] [place], [place] [address] CORRECTED ON 10/31 AT 0300: PREVIOUSLY REPORTED AS 193        IF BASELINE aPTT IS ELEVATED, SUGGEST PATIENT RISK ASSESSMENT BE USED TO DETERMINE APPROPRIATE ANTICOAGULANT THERAPY. CRITICAL RESULT CALLED TO, READ BACK BY AND VERIFIED WITH: Elmarie Mainland, RN  [phone] 05/14/2018 BY MACEDA,J.   CBC     Status: Abnormal   Collection Time: 05/14/18  4:04 AM  Result Value Ref Range   WBC 5.5 4.0 - 10.5 K/uL   RBC 2.54 (L) 3.87 - 5.11 MIL/uL   Hemoglobin 7.5 (L) 12.0 - 15.0 g/dL   HCT 22.1 (L) 36.0 - 46.0 %   MCV 87.0 80.0 - 100.0 fL   MCH 29.5 26.0 - 34.0 pg   MCHC 33.9 30.0 - 36.0 g/dL   RDW 16.2 (H) 11.5 - 15.5 %   Platelets 126 (L) 150 - 400 K/uL   nRBC 1.4 (H) 0.0 - 0.2 %    Comment: Performed at [facility], [place] [address], [place], [place] [address]   Glucose, capillary     Status: Abnormal   Collection Time: 05/14/18  4:08 AM  Result Value Ref Range   Glucose-Capillary 241 (H) 70 - 99 mg/dL  POCT Activated clotting time     Status: None   Collection Time: 05/14/18  4:16 AM  Result Value Ref Range   Activated Clotting Time 191 seconds  POCT Activated clotting time     Status: None   Collection Time: 05/14/18  5:07 AM  Result Value Ref Range   Activated Clotting Time 197 seconds  Glucose, capillary     Status: Abnormal   Collection Time: 05/14/18  7:39 AM  Result Value Ref Range   Glucose-Capillary 255 (H) 70 - 99 mg/dL   Comment 1 Venous Specimen     ECG   N/A  Telemetry   Sinus rhythm - Personally Reviewed  Radiology    US Renal  Result Date: 05/12/2018 CLINICAL DATA:  Acute renal failure. EXAM: RENAL / URINARY TRACT ULTRASOUND COMPLETE COMPARISON:  CT 05/08/2017 FINDINGS: Right Kidney: Length: 9.7 cm ,height: 5.9 cm, width: 5.8 cm volume: 172.9 cc. Echogenicity within normal limits. No mass or hydronephrosis visualized. Left Kidney: Length: 11.6 cm, height 6.0 cm, width 5.9 cm, volume 215.0 cc echogenicity within normal limits. No mass or hydronephrosis visualized. Bladder: Appears normal for degree of bladder distention. IMPRESSION: Normal exam. Electronically Signed   By: Marcello Moores  Register   On: 05/12/2018 12:10   Dg Chest Port 1 View  Result Date: 05/13/2018 CLINICAL DATA:  Respiratory failure, intubated patient, aortic stenosis, acute kidney injury. EXAM: PORTABLE CHEST 1 VIEW COMPARISON:  Portable chest x-ray of May 12, 2018 FINDINGS: The lungs are well-expanded. The interstitial markings are increased especially at  the lung bases. This is more conspicuous today. The heart is not enlarged. The pulmonary vascularity is not engorged. The endotracheal tube tip projects 5.2 cm above the carina. The esophagogastric tube tip projects below the inferior margin of the image with the proximal port above the GE junction. The  dialysis catheter tip via the left internal jugular approach projects over the proximal SVC. A power port catheter tip projects over the midportion of the SVC. A right internal jugular venous catheter tip projects over the junction of the middle and distal thirds of the SVC. IMPRESSION: Increasing interstitial infiltrate in the infrahilar regions bilaterally greatest on the right worrisome for pneumonia. No large pleural effusion. No pulmonary edema. Thoracic aortic atherosclerosis. Advancement of the esophagogastric tube by 5 cm would assure that the proximal port is positioned below the GE junction. The other support tubes and lines are in reasonable position. Electronically Signed   By: David  [name] M.D.   On: 05/13/2018 10:49   Dg Chest Port 1 View  Result Date: 05/12/2018 CLINICAL DATA:  New central line placement EXAM: PORTABLE CHEST 1 VIEW COMPARISON:  Portable chest x-ray of 05/12/2017 and 05/11/2017 FINDINGS: The tip of the endotracheal tube is approximately 4.1 cm above the carina. Right Port-A-Cath and right IJ central venous line tips overlie each other both within the mid lower SVC. A new left IJ central venous line has been inserted with the tip near the lateral wall of the mid SVC. No pneumothorax is seen. There is minimal haziness in the left mid lung and follow-up is recommended. Cardiomegaly is stable. IMPRESSION: 1. New left IJ central venous line tip overlies the lateral wall of the mid SVC. No pneumothorax. 2. Endotracheal tube tip 4.1 cm above the carina. 3. No change in right Port-A-Cath tip and right IJ central venous line both of which are in the mid lower SVC. 4. Some haziness in the left mid lung. Recommend follow-up chest x-ray. Electronically Signed   By: Ivar Drape M.D.   On: 05/12/2018 11:49    Cardiac Studies   N/A  Assessment   Active Problems:   Critical aortic valve stenosis   A-fib (HCC)   Acute kidney injury (nontraumatic) (HCC)   Lactic acidosis   Acute  hypoxemic respiratory failure (HCC)   Cardiogenic shock (HCC)   Plan   1. Shock - combination of likely sepsis and poor cardiac output due to fixed afterload of aortic stenosis. This is resolving - now off pressors. 2. Critical aortic stenosis - echo shows AS may not be as bad - gradient suggests moderate stenosis - visually moderate to severe. No indication for urgent valvuloplasty at this point - but would certainly pursue TAVR when she recovers from shock. 3. PAF - maintaining sinus rhythm without medication support. 4. Oliguric/anuric renal failure - some urine (450 cc) from bladder by straight cath overnight. Awaiting renal recovery. 5. ?Sepsis - unclear of source of infection or if metabolic acidosis due to renal failure, presumed secondary to low cardiac output, ?ATN, etc .. 6. Long QTc- noted on EKG yesterday. Would continue to monitor with daily 12 lead EKG's - avoid QT prolonging agents. Improved today to 555 msec. New deep TWI's noted over the past 2 days. 7. Acute TWI's- new deep anterolateral TWI's - cannot assess symptoms as she is sedated on fentanyl. Will check troponins today - suspect she does have underlying CAD, so low threshold for heparin if troponins abnormal. Also, could be related to electrolyte changes which are  being repleted.  CRITICAL CARE TIME: I have spent a total of 35 minutes with patient reviewing hospital notes, telemetry, EKGs, labs and examining the patient as well as establishing an assessment and plan that was discussed with the patient. > 50% of time was spent in direct patient care. The patient is critically ill with multi-organ system failure and requires high complexity decision making for assessment and support, frequent evaluation and titration of therapies, application of advanced monitoring technologies and extensive interpretation of multiple databases.  Length of Stay:  LOS: 3 days   Pixie Casino, MD, [facility], [place] Director of the [facility] Diplomate of the American Board of Clinical Lipidology Attending Cardiologist  Direct Dial: [phone]  Fax: [phone]  Website:  [email or web address] [email or web address] Sidnie Swalley 05/14/2018, 8:23 AM

## 2018-05-15 ENCOUNTER — Inpatient Hospital Stay (HOSPITAL_COMMUNITY): Payer: BC Managed Care – PPO

## 2018-05-15 DIAGNOSIS — I4819 Other persistent atrial fibrillation: Secondary | ICD-10-CM

## 2018-05-15 LAB — TYPE AND SCREEN
ABO/RH(D): A NEG
Antibody Screen: NEGATIVE
Unit division: 0
Unit division: 0
Unit division: 0
Unit division: 0
Unit division: 0
Unit division: 0

## 2018-05-15 LAB — BPAM RBC
Blood Product Expiration Date: 201911172359
Blood Product Expiration Date: 201911182359
Blood Product Expiration Date: 201911252359
Blood Product Expiration Date: 201911252359
Blood Product Expiration Date: 201911272359
Blood Product Expiration Date: 201911272359
ISSUE DATE / TIME: 201910281203
ISSUE DATE / TIME: 201910281754
ISSUE DATE / TIME: 201910290900
ISSUE DATE / TIME: 201910291504
ISSUE DATE / TIME: 201910302123
Unit Type and Rh: 600
Unit Type and Rh: 600
Unit Type and Rh: 600
Unit Type and Rh: 600
Unit Type and Rh: 600
Unit Type and Rh: 600

## 2018-05-15 LAB — POCT ACTIVATED CLOTTING TIME
Activated Clotting Time: 197 seconds
Activated Clotting Time: 202 seconds
Activated Clotting Time: 202 seconds
Activated Clotting Time: 202 seconds
Activated Clotting Time: 208 seconds
Activated Clotting Time: 213 seconds

## 2018-05-15 LAB — RENAL FUNCTION PANEL
Albumin: 2.5 g/dL — ABNORMAL LOW (ref 3.5–5.0)
Albumin: 2.4 g/dL — ABNORMAL LOW (ref 3.5–5.0)
Anion gap: 6 (ref 5–15)
Anion gap: 6 (ref 5–15)
BUN: 15 mg/dL (ref 8–23)
BUN: 14 mg/dL (ref 8–23)
CO2: 25 mmol/L (ref 22–32)
CO2: 29 mmol/L (ref 22–32)
Calcium: 8 mg/dL — ABNORMAL LOW (ref 8.9–10.3)
Calcium: 7.9 mg/dL — ABNORMAL LOW (ref 8.9–10.3)
Chloride: 105 mmol/L (ref 98–111)
Chloride: 102 mmol/L (ref 98–111)
Creatinine, Ser: 0.88 mg/dL (ref 0.44–1.00)
Creatinine, Ser: 0.74 mg/dL (ref 0.44–1.00)
GFR calc Af Amer: >60 mL/min (ref >60)
GFR calc Af Amer: >60 mL/min (ref >60)
GFR calc non Af Amer: >60 mL/min (ref >60)
GFR calc non Af Amer: >60 mL/min (ref >60)
Glucose, Bld: 219 mg/dL — ABNORMAL HIGH (ref 70–99)
Glucose, Bld: 171 mg/dL — ABNORMAL HIGH (ref 70–99)
Phosphorus: <1 mg/dL — CL (ref 2.5–4.6)
Phosphorus: 1.8 mg/dL — ABNORMAL LOW (ref 2.5–4.6)
Potassium: 3.7 mmol/L (ref 3.5–5.1)
Potassium: 5.1 mmol/L (ref 3.5–5.1)
Sodium: 136 mmol/L (ref 135–145)
Sodium: 137 mmol/L (ref 135–145)

## 2018-05-15 LAB — CBC
HCT: 24.4 % — ABNORMAL LOW (ref 36.0–46.0)
Hemoglobin: 8.4 g/dL — ABNORMAL LOW (ref 12.0–15.0)
MCH: 29.9 pg (ref 26.0–34.0)
MCHC: 34.4 g/dL (ref 30.0–36.0)
MCV: 86.8 fL (ref 80.0–100.0)
Platelets: 109 10*3/uL — ABNORMAL LOW (ref 150–400)
RBC: 2.81 MIL/uL — ABNORMAL LOW (ref 3.87–5.11)
RDW: 16.4 % — ABNORMAL HIGH (ref 11.5–15.5)
WBC: 4.7 10*3/uL (ref 4.0–10.5)
nRBC: 1.1 % — ABNORMAL HIGH (ref 0.0–0.2)

## 2018-05-15 LAB — GLUCOSE, CAPILLARY
Glucose-Capillary: 145 mg/dL — ABNORMAL HIGH (ref 70–99)
Glucose-Capillary: 162 mg/dL — ABNORMAL HIGH (ref 70–99)
Glucose-Capillary: 162 mg/dL — ABNORMAL HIGH (ref 70–99)
Glucose-Capillary: 194 mg/dL — ABNORMAL HIGH (ref 70–99)
Glucose-Capillary: 198 mg/dL — ABNORMAL HIGH (ref 70–99)

## 2018-05-15 LAB — URINE CULTURE: Culture: NO GROWTH

## 2018-05-15 LAB — APTT: aPTT: 184 seconds (ref 24–36)

## 2018-05-15 LAB — MAGNESIUM: Magnesium: 2.2 mg/dL (ref 1.7–2.4)

## 2018-05-15 MED ORDER — INSULIN ASPART 100 UNIT/ML ~~LOC~~ SOLN
6.0000 [IU] | Freq: Four times a day (QID) | SUBCUTANEOUS | Status: DC
  Administered 2018-05-15 – 2018-05-18 (×10): 6 [IU] via SUBCUTANEOUS

## 2018-05-15 MED ORDER — POTASSIUM PHOSPHATES 15 MMOLE/5ML IV SOLN
20.0000 mmol | Freq: Once | INTRAVENOUS | Status: AC
  Administered 2018-05-15: 20 mmol via INTRAVENOUS
  Filled 2018-05-15: qty 6.67

## 2018-05-15 NOTE — Progress Notes (Signed)
On call MD notified regarding inability to obtain consistent reliable BP readings via right and left arm cuff pressures. Manual pressure obtained to ensure patient safety; recommendation made for arterial line placement. Order obtained for Arterial Line placement. Telephone consent obtained from son ([name]) with two RN verification for telephone consent. MD also made aware of last ABG being obtained on 10/29 and results of last ABG. Concern for patients ventilator settings of respiratory rate 30 with no follow up ABG. Order obtained for ABG to be reassessed. Patient on CPAP mode at beginning of shift and transitioned back to full vent support. CRRT remains in progress, running without complication.  Will continue to monitor.

## 2018-05-15 NOTE — Progress Notes (Signed)
DAILY PROGRESS NOTE   Patient Name: Patricia Mata Date of Encounter: 05/15/2018  Chief Complaint   Intubated, sedated on vent  Patient Profile   Patricia Mata is a 62 y.o. female with a hx of severe AS and mild to mod, MS but same as 05/2017 and severe pulmonary hypertension who is being seen today for the evaluation of a fib RVR at the request of Dr. Tyrone Nine. Admitted to ICU and developed hypotension and hypothermia concerning for sepsis.  Subjective   Troponins stable and declining overnight, despite new TWI's on EKG which are persistent today. Short period of weaned sedation yesterday, agitation insued. Possible SBT today.   Objective   Vitals:   05/15/18 0334 05/15/18 0504 05/15/18 0511 05/15/18 0730  BP:   99/61 (!) 108/57  Pulse:    87  Resp:  (!) 30 (!) 30 (!) 30  Temp:  (!) 95.9 F (35.5 C) (!) 95.9 F (35.5 C)   TempSrc:      SpO2: 100%  100% 100%  Weight:  98.4 kg    Height:        Intake/Output Summary (Last 24 hours) at 05/15/2018 0938 Last data filed at 05/15/2018 0700 Gross per 24 hour  Intake 2348.86 ml  Output 4287 ml  Net -1938.14 ml   Filed Weights   05/13/18 0400 05/14/18 0600 05/15/18 0504  Weight: 101.4 kg 98.7 kg 98.4 kg    Physical Exam   General appearance: intubated, lightly sedated on vent, opens eyes spontaneously Neck: no carotid bruit, no JVD and thyroid not enlarged, symmetric, no tenderness/mass/nodules Lungs: diminished breath sounds bilaterally Heart: regular rate and rhythm Abdomen: soft, non-tender; bowel sounds normal; no masses,  no organomegaly Extremities: extremities normal, atraumatic, no cyanosis or edema Pulses: 2+ and symmetric Skin: Skin color, texture, turgor normal. No rashes or lesions Neurologic: Mental status: intubated, sedated on vent, opens eyes Psych: Cannot assess   Inpatient Medications    Scheduled Meds: . sodium chloride   Intravenous Once  . sodium chloride   Intravenous Once  . sodium chloride    Intravenous Once  . chlorhexidine gluconate (MEDLINE KIT)  15 mL Mouth Rinse BID  . Chlorhexidine Gluconate Cloth  6 each Topical Daily  . insulin aspart  0-20 Units Subcutaneous Q4H  . insulin glargine  20 Units Subcutaneous Daily  . latanoprost  1 drop Both Eyes QHS  . mouth rinse  15 mL Mouth Rinse 10 times per day  . midazolam  2 mg Intravenous Once  . pantoprazole sodium  40 mg Per Tube Daily  . sodium chloride flush  10-40 mL Intracatheter Q12H    Continuous Infusions: . sodium chloride 10 mL/hr at 05/14/18 1900  . feeding supplement (VITAL HIGH PROTEIN) 1,000 mL (05/15/18 0442)  . fentaNYL infusion INTRAVENOUS 100 mcg/hr (05/15/18 0736)  . heparin 10,000 units/ 20 mL infusion syringe 1,150 Units/hr (05/15/18 0135)  . meropenem (MERREM) IV 1 g (05/15/18 0601)  . norepinephrine (LEVOPHED) Adult infusion Stopped (05/14/18 1816)  . potassium PHOSPHATE IVPB (in mmol) 20 mmol (05/15/18 0659)  . prismasol BGK 4/2.5 500 mL/hr at 05/14/18 2319  . prismasol BGK 4/2.5 300 mL/hr at 05/14/18 2316  . prismasol BGK 4/2.5 1,500 mL/hr at 05/15/18 0559  . sodium chloride    . sodium chloride      PRN Meds: sodium chloride, fentaNYL, heparin, heparin, midazolam, midazolam, sodium chloride, sodium chloride, sodium chloride flush   Labs   Results for orders placed or performed during the hospital  encounter of 05/11/18 (from the past 48 hour(s))  Glucose, capillary     Status: Abnormal   Collection Time: 05/13/18  8:24 AM  Result Value Ref Range   Glucose-Capillary 222 (H) 70 - 99 mg/dL   Comment 1 Arterial Specimen   POCT Activated clotting time     Status: None   Collection Time: 05/13/18  8:49 AM  Result Value Ref Range   Activated Clotting Time 136 seconds  POCT Activated clotting time     Status: None   Collection Time: 05/13/18  9:52 AM  Result Value Ref Range   Activated Clotting Time 147 seconds  POCT Activated clotting time     Status: None   Collection Time: 05/13/18 10:48  AM  Result Value Ref Range   Activated Clotting Time 158 seconds  Glucose, capillary     Status: Abnormal   Collection Time: 05/13/18 11:07 AM  Result Value Ref Range   Glucose-Capillary 218 (H) 70 - 99 mg/dL   Comment 1 Arterial Specimen   POCT Activated clotting time     Status: None   Collection Time: 05/13/18 11:47 AM  Result Value Ref Range   Activated Clotting Time 164 seconds  POCT Activated clotting time     Status: None   Collection Time: 05/13/18 12:52 PM  Result Value Ref Range   Activated Clotting Time 164 seconds  POCT Activated clotting time     Status: None   Collection Time: 05/13/18  1:48 PM  Result Value Ref Range   Activated Clotting Time 169 seconds  POCT Activated clotting time     Status: None   Collection Time: 05/13/18  2:54 PM  Result Value Ref Range   Activated Clotting Time 169 seconds  POCT Activated clotting time     Status: None   Collection Time: 05/13/18  3:55 PM  Result Value Ref Range   Activated Clotting Time 175 seconds  CBC     Status: Abnormal   Collection Time: 05/13/18  3:57 PM  Result Value Ref Range   WBC 6.2 4.0 - 10.5 K/uL   RBC 2.36 (L) 3.87 - 5.11 MIL/uL   Hemoglobin 7.1 (L) 12.0 - 15.0 g/dL   HCT 20.1 (L) 36.0 - 46.0 %   MCV 85.2 80.0 - 100.0 fL   MCH 30.1 26.0 - 34.0 pg   MCHC 35.3 30.0 - 36.0 g/dL   RDW 16.2 (H) 11.5 - 15.5 %   Platelets 128 (L) 150 - 400 K/uL   nRBC 0.6 (H) 0.0 - 0.2 %    Comment: Performed at [facility], [address], [place], [place] [address]  Renal function panel (daily at 1600)     Status: Abnormal   Collection Time: 05/13/18  3:57 PM  Result Value Ref Range   Sodium 136 135 - 145 mmol/L   Potassium 3.1 (L) 3.5 - 5.1 mmol/L   Chloride 103 98 - 111 mmol/L   CO2 21 (L) 22 - 32 mmol/L   Glucose, Bld 246 (H) 70 - 99 mg/dL   BUN 30 (H) 8 - 23 mg/dL   Creatinine, Ser 1.87 (H) 0.44 - 1.00 mg/dL   Calcium 7.5 (L) 8.9 - 10.3 mg/dL   Phosphorus 1.1 (L) 2.5 - 4.6 mg/dL   Albumin 2.5 (L) 3.5 -  5.0 g/dL   GFR calc non Af Amer 28 (L) >60 mL/min   GFR calc Af Amer 32 (L) >60 mL/min    Comment: (NOTE) The eGFR has been  calculated using the CKD EPI equation. This calculation has not been validated in all clinical situations. eGFR's persistently <60 mL/min signify possible Chronic Kidney Disease.    Anion gap 12 5 - 15    Comment: Performed at [facility] [address] [place], [place] [address]  Glucose, capillary     Status: Abnormal   Collection Time: 05/13/18  3:57 PM  Result Value Ref Range   Glucose-Capillary 230 (H) 70 - 99 mg/dL   Comment 1 Arterial Specimen   POCT Activated clotting time     Status: None   Collection Time: 05/13/18  4:56 PM  Result Value Ref Range   Activated Clotting Time 175 seconds  POCT Activated clotting time     Status: None   Collection Time: 05/13/18  5:51 PM  Result Value Ref Range   Activated Clotting Time 180 seconds  POCT Activated clotting time     Status: None   Collection Time: 05/13/18  6:54 PM  Result Value Ref Range   Activated Clotting Time 169 seconds  Prepare RBC     Status: None   Collection Time: 05/13/18  7:37 PM  Result Value Ref Range   Order Confirmation      ORDER PROCESSED BY BLOOD BANK Performed at [facility], [place] [address] [place], [place] [address]   Glucose, capillary     Status: Abnormal   Collection Time: 05/13/18  8:03 PM  Result Value Ref Range   Glucose-Capillary 208 (H) 70 - 99 mg/dL   Comment 1 Arterial Specimen   POCT Activated clotting time     Status: None   Collection Time: 05/13/18  8:06 PM  Result Value Ref Range   Activated Clotting Time 180 seconds  POCT Activated clotting time     Status: None   Collection Time: 05/13/18  9:06 PM  Result Value Ref Range   Activated Clotting Time 186 seconds  POCT Activated clotting time     Status: None   Collection Time: 05/13/18 10:12 PM  Result Value Ref Range   Activated Clotting Time 186 seconds  POCT Activated clotting time      Status: None   Collection Time: 05/13/18 11:13 PM  Result Value Ref Range   Activated Clotting Time 186 seconds  Glucose, capillary     Status: Abnormal   Collection Time: 05/14/18 12:01 AM  Result Value Ref Range   Glucose-Capillary 131 (H) 70 - 99 mg/dL   Comment 1 Venous Specimen   POCT Activated clotting time     Status: None   Collection Time: 05/14/18 12:20 AM  Result Value Ref Range   Activated Clotting Time 191 seconds  Urinalysis, Routine w reflex microscopic     Status: Abnormal   Collection Time: 05/14/18 12:34 AM  Result Value Ref Range   Color, Urine AMBER (A) YELLOW    Comment: BIOCHEMICALS MAY BE AFFECTED BY COLOR   APPearance HAZY (A) CLEAR   Specific Gravity, Urine 1.017 1.005 - 1.030   pH 5.0 5.0 - 8.0   Glucose, UA NEGATIVE NEGATIVE mg/dL   Hgb urine dipstick NEGATIVE NEGATIVE   Bilirubin Urine NEGATIVE NEGATIVE   Ketones, ur 5 (A) NEGATIVE mg/dL   Protein, ur 100 (A) NEGATIVE mg/dL   Nitrite NEGATIVE NEGATIVE   Leukocytes, UA NEGATIVE NEGATIVE   RBC / HPF 0-5 0 - 5 RBC/hpf   WBC, UA 0-5 0 - 5 WBC/hpf   Bacteria, UA RARE (A) NONE SEEN   Squamous  Epithelial / LPF 0-5 0 - 5   Mucus PRESENT    Hyaline Casts, UA PRESENT     Comment: Performed at [facility], [place] [address] [place], [place] [address]  POCT Activated clotting time     Status: None   Collection Time: 05/14/18  1:12 AM  Result Value Ref Range   Activated Clotting Time 186 seconds  POCT Activated clotting time     Status: None   Collection Time: 05/14/18  2:13 AM  Result Value Ref Range   Activated Clotting Time 186 seconds  POCT Activated clotting time     Status: None   Collection Time: 05/14/18  3:16 AM  Result Value Ref Range   Activated Clotting Time 197 seconds  Renal function panel (daily at 0500)     Status: Abnormal   Collection Time: 05/14/18  4:04 AM  Result Value Ref Range   Sodium 137 135 - 145 mmol/L   Potassium 3.4 (L) 3.5 - 5.1 mmol/L   Chloride 105 98 - 111 mmol/L    CO2 24 22 - 32 mmol/L   Glucose, Bld 251 (H) 70 - 99 mg/dL   BUN 22 8 - 23 mg/dL   Creatinine, Ser 1.47 (H) 0.44 - 1.00 mg/dL   Calcium 7.7 (L) 8.9 - 10.3 mg/dL   Phosphorus <1.0 (LL) 2.5 - 4.6 mg/dL    Comment: CRITICAL RESULT CALLED TO, READ BACK BY AND VERIFIED WITH: MILLER,K RN 05/14/2018 0612 JORDANS    Albumin 2.4 (L) 3.5 - 5.0 g/dL   GFR calc non Af Amer 37 (L) >60 mL/min   GFR calc Af Amer 43 (L) >60 mL/min    Comment: (NOTE) The eGFR has been calculated using the CKD EPI equation. This calculation has not been validated in all clinical situations. eGFR's persistently <60 mL/min signify possible Chronic Kidney Disease.    Anion gap 8 5 - 15    Comment: Performed at [facility] [address] [place], [place] [address]  Magnesium     Status: None   Collection Time: 05/14/18  4:04 AM  Result Value Ref Range   Magnesium 2.1 1.7 - 2.4 mg/dL    Comment: Performed at [facility] [address] [place], [place] [address]  APTT     Status: Abnormal   Collection Time: 05/14/18  4:04 AM  Result Value Ref Range   aPTT 193 (HH) 24 - 36 seconds    Comment: REPEATED TO VERIFY        IF BASELINE aPTT IS ELEVATED, SUGGEST PATIENT RISK ASSESSMENT BE USED TO DETERMINE APPROPRIATE ANTICOAGULANT THERAPY. CRITICAL RESULT CALLED TO, READ BACK BY AND VERIFIED WITH: Performed at [facility], [place] [address] [place], [place] [address] CORRECTED ON 10/31 AT 4403: PREVIOUSLY REPORTED AS 193        IF BASELINE aPTT IS ELEVATED, SUGGEST PATIENT RISK ASSESSMENT BE USED TO DETERMINE APPROPRIATE ANTICOAGULANT THERAPY. CRITICAL RESULT CALLED TO, READ BACK BY AND VERIFIED WITHElmarie Mainland, RN  [phone] 05/14/2018 BY MACEDA,J.   CBC     Status: Abnormal   Collection Time: 05/14/18  4:04 AM  Result Value Ref Range   WBC 5.5 4.0 - 10.5 K/uL   RBC 2.54 (L) 3.87 - 5.11 MIL/uL   Hemoglobin 7.5 (L) 12.0 - 15.0 g/dL   HCT 22.1 (L) 36.0 - 46.0 %   MCV 87.0 80.0 - 100.0 fL   MCH  29.5 26.0 - 34.0 pg  MCHC 33.9 30.0 - 36.0 g/dL   RDW 16.2 (H) 11.5 - 15.5 %   Platelets 126 (L) 150 - 400 K/uL   nRBC 1.4 (H) 0.0 - 0.2 %    Comment: Performed at [facility] [address] [place], [place] [address]  Glucose, capillary     Status: Abnormal   Collection Time: 05/14/18  4:08 AM  Result Value Ref Range   Glucose-Capillary 241 (H) 70 - 99 mg/dL  POCT Activated clotting time     Status: None   Collection Time: 05/14/18  4:16 AM  Result Value Ref Range   Activated Clotting Time 191 seconds  POCT Activated clotting time     Status: None   Collection Time: 05/14/18  5:07 AM  Result Value Ref Range   Activated Clotting Time 197 seconds  Glucose, capillary     Status: Abnormal   Collection Time: 05/14/18  7:39 AM  Result Value Ref Range   Glucose-Capillary 255 (H) 70 - 99 mg/dL   Comment 1 Venous Specimen   POCT Activated clotting time     Status: None   Collection Time: 05/14/18  8:48 AM  Result Value Ref Range   Activated Clotting Time 191 seconds  Troponin I (q 6hr x 3)     Status: Abnormal   Collection Time: 05/14/18  8:54 AM  Result Value Ref Range   Troponin I 0.15 (HH) <0.03 ng/mL    Comment: CRITICAL RESULT CALLED TO, READ BACK BY AND VERIFIED WITH: Felicie Morn [phone] Performed at [facility], [address] [place], [place] [address]   Glucose, capillary     Status: Abnormal   Collection Time: 05/14/18 11:12 AM  Result Value Ref Range   Glucose-Capillary 266 (H) 70 - 99 mg/dL   Comment 1 Venous Specimen   POCT Activated clotting time     Status: None   Collection Time: 05/14/18 12:55 PM  Result Value Ref Range   Activated Clotting Time 208 seconds  Phosphorus     Status: Abnormal   Collection Time: 05/14/18  2:31 PM  Result Value Ref Range   Phosphorus 2.4 (L) 2.5 - 4.6 mg/dL    Comment: Performed at [facility], [place] [address], [place], [place] [address]  Troponin I (q 6hr x 3)     Status: Abnormal    Collection Time: 05/14/18  2:31 PM  Result Value Ref Range   Troponin I 0.15 (HH) <0.03 ng/mL    Comment: CRITICAL VALUE NOTED.  VALUE IS CONSISTENT WITH PREVIOUSLY REPORTED AND CALLED VALUE. Performed at [facility], [place] [address], [place],  [address]   Renal function panel (daily at 1600)     Status: Abnormal   Collection Time: 05/14/18  2:33 PM  Result Value Ref Range   Sodium 137 135 - 145 mmol/L   Potassium 4.3 3.5 - 5.1 mmol/L    Comment: SLIGHT HEMOLYSIS   Chloride 102 98 - 111 mmol/L   CO2 28 22 - 32 mmol/L   Glucose, Bld 246 (H) 70 - 99 mg/dL   BUN 17 8 - 23 mg/dL   Creatinine, Ser 1.10 (H) 0.44 - 1.00 mg/dL   Calcium 7.8 (L) 8.9 - 10.3 mg/dL   Phosphorus 2.4 (L) 2.5 - 4.6 mg/dL   Albumin 2.6 (L) 3.5 - 5.0 g/dL   GFR calc non Af Amer 52 (L) >60 mL/min   GFR calc Af Amer >60 >60 mL/min  Comment: (NOTE) The eGFR has been calculated using the CKD EPI equation. This calculation has not been validated in all clinical situations. eGFR's persistently <60 mL/min signify possible Chronic Kidney Disease.    Anion gap 7 5 - 15    Comment: Performed at [facility] [address], [place], [place] [address]  Glucose, capillary     Status: Abnormal   Collection Time: 05/14/18  3:26 PM  Result Value Ref Range   Glucose-Capillary 231 (H) 70 - 99 mg/dL   Comment 1 Venous Specimen   POCT Activated clotting time     Status: None   Collection Time: 05/14/18  4:44 PM  Result Value Ref Range   Activated Clotting Time 208 seconds  Glucose, capillary     Status: Abnormal   Collection Time: 05/14/18  7:42 PM  Result Value Ref Range   Glucose-Capillary 250 (H) 70 - 99 mg/dL   Comment 1 Venous Specimen   POCT Activated clotting time     Status: None   Collection Time: 05/14/18  7:52 PM  Result Value Ref Range   Activated Clotting Time 202 seconds  Troponin I (q 6hr x 3)     Status: Abnormal   Collection Time: 05/14/18  8:37 PM  Result Value Ref Range    Troponin I 0.13 (HH) <0.03 ng/mL    Comment: CRITICAL VALUE NOTED.  VALUE IS CONSISTENT WITH PREVIOUSLY REPORTED AND CALLED VALUE. Performed at [facility], [place] [address] [place], [place] [address]   Glucose, capillary     Status: Abnormal   Collection Time: 05/15/18 12:20 AM  Result Value Ref Range   Glucose-Capillary 198 (H) 70 - 99 mg/dL  POCT Activated clotting time     Status: None   Collection Time: 05/15/18 12:21 AM  Result Value Ref Range   Activated Clotting Time 213 seconds  Renal function panel (daily at 0500)     Status: Abnormal   Collection Time: 05/15/18  4:27 AM  Result Value Ref Range   Sodium 136 135 - 145 mmol/L   Potassium 3.7 3.5 - 5.1 mmol/L   Chloride 105 98 - 111 mmol/L   CO2 25 22 - 32 mmol/L   Glucose, Bld 219 (H) 70 - 99 mg/dL   BUN 15 8 - 23 mg/dL   Creatinine, Ser 0.88 0.44 - 1.00 mg/dL   Calcium 8.0 (L) 8.9 - 10.3 mg/dL   Phosphorus <1.0 (LL) 2.5 - 4.6 mg/dL    Comment: CRITICAL RESULT CALLED TO, READ BACK BY AND VERIFIED WITH: R MULLER,RN [phone] W[place]    Albumin 2.5 (L) 3.5 - 5.0 g/dL   GFR calc non Af Amer >60 >60 mL/min   GFR calc Af Amer >60 >60 mL/min    Comment: (NOTE) The eGFR has been calculated using the CKD EPI equation. This calculation has not been validated in all clinical situations. eGFR's persistently <60 mL/min signify possible Chronic Kidney Disease.    Anion gap 6 5 - 15    Comment: Performed at [facility] [address] [place], [place] [address]  Magnesium     Status: None   Collection Time: 05/15/18  4:27 AM  Result Value Ref Range   Magnesium 2.2 1.7 - 2.4 mg/dL    Comment: Performed at [facility] [address] [place], [place] [address]  APTT     Status: Abnormal   Collection Time: 05/15/18  4:27 AM  Result Value Ref Range  aPTT 184 (HH) 24 - 36 seconds    Comment: REPEATED TO VERIFY CRITICAL RESULT CALLED TO, READ BACK BY AND VERIFIED WITH: M.AMO,RN 0527 05/15/18 G.MCADOO IF  BASELINE aPTT IS ELEVATED, SUGGEST PATIENT RISK ASSESSMENT BE USED TO DETERMINE APPROPRIATE ANTICOAGULANT THERAPY. Performed at [facility], [place] [address], [place], [place] [address]   CBC     Status: Abnormal   Collection Time: 05/15/18  4:27 AM  Result Value Ref Range   WBC 4.7 4.0 - 10.5 K/uL   RBC 2.81 (L) 3.87 - 5.11 MIL/uL   Hemoglobin 8.4 (L) 12.0 - 15.0 g/dL   HCT 24.4 (L) 36.0 - 46.0 %   MCV 86.8 80.0 - 100.0 fL   MCH 29.9 26.0 - 34.0 pg   MCHC 34.4 30.0 - 36.0 g/dL   RDW 16.4 (H) 11.5 - 15.5 %   Platelets 109 (L) 150 - 400 K/uL    Comment: REPEATED TO VERIFY PLATELET COUNT CONFIRMED BY SMEAR Immature Platelet Fraction may be clinically indicated, consider ordering this additional test [id number]    nRBC 1.1 (H) 0.0 - 0.2 %    Comment: Performed at [facility], [place] [address] [place], [place] [address]  Glucose, capillary     Status: Abnormal   Collection Time: 05/15/18  4:36 AM  Result Value Ref Range   Glucose-Capillary 194 (H) 70 - 99 mg/dL  POCT Activated clotting time     Status: None   Collection Time: 05/15/18  4:36 AM  Result Value Ref Range   Activated Clotting Time 202 seconds    ECG   Sinus rhythm with deep anterolateral TWI's - personally reviewed  Telemetry   Sinus rhythm with periods of frequent PAC's, PAT - ?afib - Personally Reviewed  Radiology    No results found.  Cardiac Studies   N/A  Assessment   Active Problems:   Critical aortic valve stenosis   A-fib (HCC)   Acute kidney injury (nontraumatic) (HCC)   Lactic acidosis   Acute hypoxemic respiratory failure (HCC)   Cardiogenic shock (HCC)   Plan   1. Shock - combination of likely sepsis and poor cardiac output due to fixed afterload of aortic stenosis. This is resolving - now off pressors. 2. Critical aortic stenosis - echo shows AS may not be as bad - gradient suggests moderate stenosis - visually moderate to severe. No indication for urgent valvuloplasty  at this point - but would certainly pursue TAVR when she recovers from shock. 3. PAF - ?recurrence overnight, frequent PAC's and PAT. 4. Oliguric/anuric renal failure - more urine overnight, may be able to wean off CRRT. 5. ?Sepsis - unclear of source of infection or if metabolic acidosis due to renal failure, presumed secondary to low cardiac output, ?ATN, etc .. 6. Long QTc- noted on EKG yesterday. Would continue to monitor with daily 12 lead EKG's - avoid QT prolonging agents. Improved today to 520 msec from 550 msec. 7. Acute TWI's- new deep anterolateral TWI's - these persist despite electrolyte replacement. May represent ischemia. Troponins stable to downtrending. This could very well be ischemia. Hemoglobin stable to improved. May consider starting IV Heparin tomorrow when CRRT ends - since PTT prolonged currently on heparin through system.  TIME SPENT WITH PATIENT: 35 minutes of direct patient care. More than 50% of that time was spent on coordination of care and counseling regarding a critical ill-patient with multiple organ system dysfunction, titrating medications, evaluating databases.   Length of Stay:  LOS:  4 days   Pixie Casino, MD, [facility], [place] Director of the [facility] Lipid Disorders &  Cardiovascular Risk Reduction Clinic Diplomate of the American Board of Clinical Lipidology Attending Cardiologist  Direct Dial: [phone]  Fax: [phone]  Website:  [email or web address]  Nadean Corwin Isaic Syler 05/15/2018, 8:22 AM

## 2018-05-15 NOTE — Progress Notes (Signed)
Nutrition Follow Up  DOCUMENTATION CODES:   Obesity unspecified  INTERVENTION:    Vital High Protein at goal rate of 55 ml/h (1320 ml per day)   Provides 1320 kcals, 115 gm protein, 1103 ml free water daily  NUTRITION DIAGNOSIS:   Inadequate oral intake related to inability to eat as evidenced by NPO status, ongong  GOAL:   Provide needs based on ASPEN/SCCM guidelines, met  MONITOR:   Vent status, TF tolerance, Labs, Skin, Weight trends, I & O's  ASSESSMENT:   62 yo Female with severe left ear and pulmonary hypertension and a history of myelodysplastic syndrome was admitted ith A. fib/RVR, anemia and AKI which was felt to be due to low perfusion.  10/29 CVVHD initiated  Patient is currently intubated on ventilator support Temp (24hrs), Avg:97.3 F (36.3 C), Min:95.9 F (35.5 C), Max:98.4 F (36.9 C)  Pt sedated at time of visit today. Vital High Protein infusing at goal rate of 55 ml/hr via OGT. Spoke with Clarene Critchley, RN. Pt tolerating TF well.  Nephrology note reviewed. AKI. Continues on CVVHD. CCM note reviewed. Severe pulmonary HTN & metabolic acidosis. Acute on chronic anemia. Hx of myelodysplastic syndrome.  Labs reviewed. Phos <1.0 (L) >> started on IV supplementation. Medications include Versed, Protonix & Lantus. CBG's [phone].  NUTRITION - FOCUSED PHYSICAL EXAM:  Completed. No muscle or fat loss noticed.  Diet Order:   Diet Order    None     EDUCATION NEEDS:   Not appropriate for education at this time  Skin:  Skin Assessment: Reviewed RN Assessment  Last BM:  10/28    Intake/Output Summary (Last 24 hours) at 05/15/2018 1203 Last data filed at 05/15/2018 1100 Gross per 24 hour  Intake 2088.79 ml  Output 4014 ml  Net -1925.21 ml   Height:   Ht Readings from Last 1 Encounters:  05/11/18 '5\' 4"'  (1.626 m)   Weight:   Wt Readings from Last 1 Encounters:  05/15/18 98.4 kg   Ideal Body Weight:  54.5 kg  BMI:  Body mass index is  37.24 kg/m.  Estimated Nutritional Needs:   Kcal:  5361-4431  Protein:  >/= 110 gm  Fluid:  per MD  Arthur Holms, RD, LDN Pager #: [phone] After-Hours Pager #: [phone]

## 2018-05-15 NOTE — Progress Notes (Signed)
[facility] Progress Note    Assessment/ Plan:   62 y.o.femalewith history of stage III colon cancer,MDS dx 2017,chronic macrocytic anemia requiring frequent transfusions, DM, HTN, severeAS, severe TR, severe pulmonary hypertension with recent echocardiogram showing RVSP 112 mmHghere withprogressive dyspnea found to have AKI andA. fib with RVR w/ AKI;cr risenfrom 1.05 to 3.37. Eventually intubated forsepsis + metabolic acidosis with a pH of 6.93 + hyperkalemia (K5.4). She also had hypotension req Levophed w/ CXR showing either edema vs PNA. She has had no UOP since admission.  1. Acute Kidney Injury from hypoperfusion + sepsis and currently essentially anuric. Patient almost certainly in ATN and may be in for a protracted course if she recovers. Anuria is not a good sign and her cardiac status is concerning as well with the severe AS and pulm HTN.  - Dose meds for CRRT; phos being replaced  -Currently on 4K bath - Tolerating  UF 47m/hr (started 10/31); currently net even with e/o volume overload. Currently off Levophed. - Check bladder scan as well.  - UOP increasing -> will stop the CRRT tomorrow AM before next filter change. Still has fluid onboard but would like to see where her renal function is at.  - NO need to replace filter if she clots it off.  2. Afib RVR - rate controlled now off Cardizem 3. Severe AS and pulm HTN - not stable for more definitive treatments.Per cards no valvulopasty fow now; TAVR only when off CRRT with improvement in overall status. 4. H/o MDS and colon CA 5. DM  Subjective:   Intubated.  Filter clotted once off heparin when RRT 1st started but since then has been on heparin.   Seen on CRRT through LMagnoliatemp cath. 4k bath Pre/post/qd 500/300/1500 UF 544mhr net  No further clotting and next scheduled filter change tomorrow.   Objective:   BP (!) 108/57   Pulse 87   Temp (!) 95.9 F (35.5 C)   Resp (!) 30    Ht _0  (1.626 m)   Wt 98.4 kg   SpO2 100%   BMI 37.24 kg/m   Intake/Output Summary (Last 24 hours) at 05/15/2018 0827 Last data filed at 05/15/2018 0700 Gross per 24 hour  Intake 2348.86 ml  Output 4287 ml  Net -1938.14 ml   Weight change: -0.3 kg  Physical Exam: General appearance:NAD, on vent Head:NCAT Eyes:not opening eyes today Resp:no rhonchi noted Cardio:irregularly irregular rhythm GIGU:RKYHS but soft Extremities:edemapresent 1+ thigh  Imaging: No results found.  Labs: BMET Recent Labs  Lab 05/12/18 0300 05/12/18 1625 05/13/18 0333 05/13/18 1557 05/14/18 0404 05/14/18 1431 05/14/18 1433 05/15/18 0427  NA 133* 137 138 136 137  --  137 136  K 5.4* 3.9 3.3* 3.1* 3.4*  --  4.3 3.7  CL 100 96* 100 103 105  --  102 105  CO2 <7* 12* 19* 21* 24  --  28 25  GLUCOSE 114* 195* 212* 246* 251*  --  246* 219*  BUN 58* 52* 43* 30* 22  --  17 15  CREATININE 3.94* 3.61* 2.82* 1.87* 1.47*  --  1.10* 0.88  CALCIUM 8.4* 8.1* 7.9* 7.5* 7.7*  --  7.8* 8.0*  PHOS 10.1* 7.2* 2.4* 1.1* <1.0* 2.4* 2.4* <1.0*   CBC Recent Labs  Lab 05/11/18 0650  05/13/18 0333 05/13/18 1557 05/14/18 0404 05/15/18 0427  WBC 5.7   < > 8.9 6.2 5.5 4.7  NEUTROABS 3.7  --   --   --   --   --  HGB 6.3*   < > 7.9* 7.1* 7.5* 8.4*  HCT 19.9*   < > 22.0* 20.1* 22.1* 24.4*  MCV 97.1   < > 84.6 85.2 87.0 86.8  PLT 257   < > 156 128* 126* 109*   < > = values in this interval not displayed.    Medications:    . sodium chloride   Intravenous Once  . sodium chloride   Intravenous Once  . sodium chloride   Intravenous Once  . chlorhexidine gluconate (MEDLINE KIT)  15 mL Mouth Rinse BID  . Chlorhexidine Gluconate Cloth  6 each Topical Daily  . insulin aspart  0-20 Units Subcutaneous Q4H  . insulin glargine  20 Units Subcutaneous Daily  . latanoprost  1 drop Both Eyes QHS  . mouth rinse  15 mL Mouth Rinse 10 times per day  . midazolam  2 mg Intravenous Once  . pantoprazole sodium  40  mg Per Tube Daily  . sodium chloride flush  10-40 mL Intracatheter Q12H      Otelia Santee, MD 05/15/2018, 8:27 AM

## 2018-05-15 NOTE — Progress Notes (Addendum)
NAME:  Patricia Mata, MRN:  [id number], DOB:  [birth year], LOS: 4 ADMISSION DATE:  05/11/2018, CONSULTATION DATE:  10/28 REFERRING MD:  EDP, CHIEF COMPLAINT:  AFib RVR    Brief History   62yo female with hx severe AS, severe TR, severe pulmonary HTN, stage 3 colon cancer and myelodysplastic syndrome and anemia requiring frequent transfusions presented 10/28 with dyspnea found to have AFib with RVR, acute on chronic anemia and AKI.  Cardiology requesting ICU admission per PCCM.  She developed severe metabolic acidosis and shock requiring mechanical ventilation  Past Medical History  Severe AS she Severe TR Severe pulmonary hypertension Stage III colon cancer Myelodysplastic syndrome-diagnosed 2017 Chronic microcytic anemia Hypertension Diabetes Significant Hospital Events   10/29 early am  Intubated emergently>> suspected sepsis>>severe  metabolic acidosis 50/93 CRRT  Consults: date of consult/date signed off & final recs:  Cardiology 10/28  Procedures (surgical and bedside):  10/29>> ETT>> 10/29>> RIJ CVC>> 10/29>> A Line 10/29>> LIJ HD cath  Significant Diagnostic Tests:  10/25 CT abdomen and pelvis Heterogeneous hepatic parenchyma  ? Due to  hepatic venous congestion. Small amount of perihepatic ascites and free fluid in the pelvis.    Micro Data:  10/29>> Blood ng 10/31>> Urine 10/29>> Sputum >>ng  Antimicrobials:  10/29 Merrem >>11/1 10/29 Vanc >> 10/31  Subjective:   Remains critically ill but improving. She is orally intubated on CRRT Sedated on fentanyl drip low-dose   Objective   Blood pressure (!) 108/57, pulse 87, temperature (!) 95.9 F (35.5 C), resp. rate (!) 30, height 5\' 4"  (1.626 m), weight 98.4 kg, SpO2 100 %. CVP:  [6 mmHg-8 mmHg] 6 mmHg  Vent Mode: PRVC FiO2 (%):  [40 %] 40 % Set Rate:  [30 bmp] 30 bmp Vt Set:  [490 mL] 490 mL PEEP:  [5 cmH20] 5 cmH20 Pressure Support:  [10 cmH20-15 cmH20] 15 cmH20 Plateau Pressure:  [21 cmH20-24 cmH20]  21 cmH20   Intake/Output Summary (Last 24 hours) at 05/15/2018 0916 Last data filed at 05/15/2018 0700 Gross per 24 hour  Intake 2201.34 ml  Output 4102 ml  Net -1900.66 ml   Filed Weights   05/13/18 0400 05/14/18 0600 05/15/18 0504  Weight: 101.4 kg 98.7 kg 98.4 kg    Sedated on  fentanyl drip, on CRRT, acutely ill Pallor +, no icterus No JVD, 1+ edema Clear breath sounds bilateral, no rhonchi Ejection systolic murmur 2/6 at base, S1-S2 normal Soft nontender abdomen RA SS -2, does not follow commands  Chest x-ray from 11/1 personally reviewed, cardiomegaly and bilateral hilar and basal infiltrates?  Edema pattern  Resolved Hospital Problem list     Assessment & Plan:  Cardiogenic shock  Plan: Levophed off, goal SBP 100 & above long QTc, avoid QT prolonging drugs  A. fib with RVR>> rate now controlled and in Sinus Rhythm Plan- If recurs, consider amio Holding on heparin given significant anemia Can restart home metoprolol if blood pressure improves   Severe aortic stenosis Severe TR Severe pulmonary hypertension - PA peak pressure >144mmHg Hx HTN Plan- Cardiology following Not a candidate for TAVR/ balloon Valvuloplasty at this time, will need to extubate  before being considered for valve replacement  Doubt sepsis, cultures neg Dc meropenem  AKI  Severe metabolic acidosis Plan CRRT to stop when filter clots , lower ultrafiltrate since she is starting to make some urine, hopeful for renal recovery here   Acute on chronic anemia s/p 4u PRBC so far Myelodysplastic syndrome Plan- Goal hemoglobin greater than  8 Follow-up CBC    Hypomagnesemia Hyperkalemia Severe hypo-phos Plan- repleted  DM-2, uncontrolled  Plan- SSI Increased lantus to 20 U daily Add TF coverage  Disposition / Summary of Today's Plan 05/15/18    Appears to have improved somewhat, off pressors and making urine. Lighten up sedation and start spontaneous breathing trials with  goal extubation Hopeful for renal recovery here Care coordinated with cardiology and nephrology     Code Status: Full code Family Communication: Family updated daily at bedside   The patient is critically ill with multiple organ systems failure and requires high complexity decision making for assessment and support, frequent evaluation and titration of therapies, application of advanced monitoring technologies and extensive interpretation of multiple databases. Critical Care Time devoted to patient care services described in this note independent of APP/resident  time is 32 minutes.   Kara Mead MD. Shade Flood. [facility] Pager [phone] If no response call [phone]   05/15/2018     05/15/2018 9:16 AM

## 2018-05-15 NOTE — Plan of Care (Signed)
  Problem: Respiratory: Goal: Ability to maintain a clear airway and adequate ventilation will improve Outcome: Progressing   Problem: Role Relationship: Goal: Method of communication will improve Outcome: Progressing

## 2018-05-16 ENCOUNTER — Other Ambulatory Visit: Payer: Self-pay

## 2018-05-16 ENCOUNTER — Inpatient Hospital Stay (HOSPITAL_COMMUNITY): Payer: BC Managed Care – PPO

## 2018-05-16 DIAGNOSIS — I248 Other forms of acute ischemic heart disease: Secondary | ICD-10-CM

## 2018-05-16 LAB — HEPATIC FUNCTION PANEL
ALT: 151 U/L — ABNORMAL HIGH (ref 0–44)
AST: 249 U/L — ABNORMAL HIGH (ref 15–41)
Albumin: 2.2 g/dL — ABNORMAL LOW (ref 3.5–5.0)
Alkaline Phosphatase: 105 U/L (ref 38–126)
Bilirubin, Direct: 3.3 mg/dL — ABNORMAL HIGH (ref 0.0–0.2)
Indirect Bilirubin: 2.4 mg/dL — ABNORMAL HIGH (ref 0.3–0.9)
Total Bilirubin: 5.7 mg/dL — ABNORMAL HIGH (ref 0.3–1.2)
Total Protein: 5.6 g/dL — ABNORMAL LOW (ref 6.5–8.1)

## 2018-05-16 LAB — GLUCOSE, CAPILLARY
Glucose-Capillary: 111 mg/dL — ABNORMAL HIGH (ref 70–99)
Glucose-Capillary: 128 mg/dL — ABNORMAL HIGH (ref 70–99)
Glucose-Capillary: 130 mg/dL — ABNORMAL HIGH (ref 70–99)
Glucose-Capillary: 150 mg/dL — ABNORMAL HIGH (ref 70–99)
Glucose-Capillary: 151 mg/dL — ABNORMAL HIGH (ref 70–99)
Glucose-Capillary: 196 mg/dL — ABNORMAL HIGH (ref 70–99)
Glucose-Capillary: 94 mg/dL (ref 70–99)

## 2018-05-16 LAB — CBC
HCT: 26.3 % — ABNORMAL LOW (ref 36.0–46.0)
HCT: 23.4 % — ABNORMAL LOW (ref 36.0–46.0)
Hemoglobin: 8.6 g/dL — ABNORMAL LOW (ref 12.0–15.0)
Hemoglobin: 7.4 g/dL — ABNORMAL LOW (ref 12.0–15.0)
MCH: 29.2 pg (ref 26.0–34.0)
MCH: 28.6 pg (ref 26.0–34.0)
MCHC: 32.7 g/dL (ref 30.0–36.0)
MCHC: 31.6 g/dL (ref 30.0–36.0)
MCV: 89.2 fL (ref 80.0–100.0)
MCV: 90.3 fL (ref 80.0–100.0)
Platelets: 109 10*3/uL — ABNORMAL LOW (ref 150–400)
Platelets: 100 10*3/uL — ABNORMAL LOW (ref 150–400)
RBC: 2.95 MIL/uL — ABNORMAL LOW (ref 3.87–5.11)
RBC: 2.59 MIL/uL — ABNORMAL LOW (ref 3.87–5.11)
RDW: 17.5 % — ABNORMAL HIGH (ref 11.5–15.5)
RDW: 17.5 % — ABNORMAL HIGH (ref 11.5–15.5)
WBC: 5.9 10*3/uL (ref 4.0–10.5)
WBC: 5.9 10*3/uL (ref 4.0–10.5)
nRBC: 1 % — ABNORMAL HIGH (ref 0.0–0.2)
nRBC: 0.9 % — ABNORMAL HIGH (ref 0.0–0.2)

## 2018-05-16 LAB — RENAL FUNCTION PANEL
Albumin: 2.5 g/dL — ABNORMAL LOW (ref 3.5–5.0)
Albumin: 2.2 g/dL — ABNORMAL LOW (ref 3.5–5.0)
Anion gap: 3 — ABNORMAL LOW (ref 5–15)
Anion gap: 5 (ref 5–15)
BUN: 17 mg/dL (ref 8–23)
BUN: 31 mg/dL — ABNORMAL HIGH (ref 8–23)
CO2: 27 mmol/L (ref 22–32)
CO2: 25 mmol/L (ref 22–32)
Calcium: 8.1 mg/dL — ABNORMAL LOW (ref 8.9–10.3)
Calcium: 8.3 mg/dL — ABNORMAL LOW (ref 8.9–10.3)
Chloride: 106 mmol/L (ref 98–111)
Chloride: 104 mmol/L (ref 98–111)
Creatinine, Ser: 0.83 mg/dL (ref 0.44–1.00)
Creatinine, Ser: 1.05 mg/dL — ABNORMAL HIGH (ref 0.44–1.00)
GFR calc Af Amer: >60 mL/min (ref >60)
GFR calc Af Amer: >60 mL/min (ref >60)
GFR calc non Af Amer: >60 mL/min (ref >60)
GFR calc non Af Amer: 55 mL/min — ABNORMAL LOW (ref >60)
Glucose, Bld: 149 mg/dL — ABNORMAL HIGH (ref 70–99)
Glucose, Bld: 136 mg/dL — ABNORMAL HIGH (ref 70–99)
Phosphorus: 2.5 mg/dL (ref 2.5–4.6)
Phosphorus: 3.5 mg/dL (ref 2.5–4.6)
Potassium: 3.9 mmol/L (ref 3.5–5.1)
Potassium: 4 mmol/L (ref 3.5–5.1)
Sodium: 136 mmol/L (ref 135–145)
Sodium: 134 mmol/L — ABNORMAL LOW (ref 135–145)

## 2018-05-16 LAB — POCT I-STAT 3, ART BLOOD GAS (G3+)
Acid-Base Excess: 3 mmol/L — ABNORMAL HIGH (ref 0.0–2.0)
Acid-Base Excess: 4 mmol/L — ABNORMAL HIGH (ref 0.0–2.0)
Bicarbonate: 25.9 mmol/L (ref 20.0–28.0)
Bicarbonate: 27.3 mmol/L (ref 20.0–28.0)
O2 Saturation: 100 %
O2 Saturation: 98 %
Patient temperature: 36.6
Patient temperature: 36.8
TCO2: 27 mmol/L (ref 22–32)
TCO2: 29 mmol/L (ref 22–32)
pCO2 arterial: 27.5 mmHg — ABNORMAL LOW (ref 32.0–48.0)
pCO2 arterial: 41.6 mmHg (ref 32.0–48.0)
pH, Arterial: 7.424 (ref 7.350–7.450)
pH, Arterial: 7.582 — ABNORMAL HIGH (ref 7.350–7.450)
pO2, Arterial: 110 mmHg — ABNORMAL HIGH (ref 83.0–108.0)
pO2, Arterial: 197 mmHg — ABNORMAL HIGH (ref 83.0–108.0)

## 2018-05-16 LAB — APTT: aPTT: 176 seconds (ref 24–36)

## 2018-05-16 LAB — MAGNESIUM: Magnesium: 2.3 mg/dL (ref 1.7–2.4)

## 2018-05-16 LAB — POCT ACTIVATED CLOTTING TIME: Activated Clotting Time: 202 seconds

## 2018-05-16 LAB — PREPARE RBC (CROSSMATCH)

## 2018-05-16 MED ORDER — "THROMBI-PAD 3""X3"" EX PADS"
1.0000 | MEDICATED_PAD | Freq: Once | CUTANEOUS | Status: AC
  Administered 2018-05-16: 1 via TOPICAL
  Filled 2018-05-16: qty 1

## 2018-05-16 MED ORDER — NOREPINEPHRINE 16 MG/250ML-% IV SOLN
0.0000 ug/min | INTRAVENOUS | Status: DC
  Administered 2018-05-16: 1.5 ug/min via INTRAVENOUS
  Filled 2018-05-16: qty 250

## 2018-05-16 MED ORDER — PNEUMOCOCCAL VAC POLYVALENT 25 MCG/0.5ML IJ INJ: 0.5000 mL | INJECTION | INTRAMUSCULAR | Status: DC

## 2018-05-16 MED ORDER — NOREPINEPHRINE 4 MG/250ML-% IV SOLN: 0.0000 ug/min | INTRAVENOUS | Status: DC

## 2018-05-16 MED ORDER — SODIUM CHLORIDE 0.9% IV SOLUTION
Freq: Once | INTRAVENOUS | Status: AC
  Administered 2018-05-16: 16:00:00 via INTRAVENOUS

## 2018-05-16 MED ORDER — DEXMEDETOMIDINE HCL IN NACL 400 MCG/100ML IV SOLN
0.0000 ug/kg/h | INTRAVENOUS | Status: DC
  Administered 2018-05-17: 0.1 ug/kg/h via INTRAVENOUS
  Administered 2018-05-17: 0.4 ug/kg/h via INTRAVENOUS
  Filled 2018-05-16 (×3): qty 100

## 2018-05-16 MED ORDER — FENTANYL BOLUS VIA INFUSION
25.0000 ug | INTRAVENOUS | Status: DC | PRN
  Filled 2018-05-16: qty 25

## 2018-05-16 NOTE — Progress Notes (Addendum)
Dr. Nelda Marseille paged and given Hgb of 7.4.  New order received to transfuse one unit of PRBC and to recheck CBC in the morning.

## 2018-05-16 NOTE — Plan of Care (Signed)
  Problem: Clinical Measurements: Goal: Will remain free from infection Outcome: Progressing Goal: Diagnostic test results will improve Outcome: Progressing Goal: Respiratory complications will improve Outcome: Progressing Goal: Cardiovascular complication will be avoided Outcome: Progressing   Problem: Activity: Goal: Risk for activity intolerance will decrease Outcome: Progressing   Problem: Nutrition: Goal: Adequate nutrition will be maintained Outcome: Progressing   Problem: Coping: Goal: Level of anxiety will decrease Outcome: Progressing   Problem: Elimination: Goal: Will not experience complications related to bowel motility Outcome: Progressing Goal: Will not experience complications related to urinary retention Outcome: Progressing   Problem: Pain Managment: Goal: General experience of comfort will improve Outcome: Progressing   Problem: Safety: Goal: Ability to remain free from injury will improve Outcome: Progressing   Problem: Clinical Measurements: Goal: Complications related to the disease process or treatment will be avoided or minimized Outcome: Progressing   Problem: Activity: Goal: Activity intolerance will improve Outcome: Progressing   Problem: Fluid Volume: Goal: Fluid volume balance will be maintained or improved Outcome: Progressing   Problem: Respiratory: Goal: Respiratory symptoms related to disease process will be avoided Outcome: Progressing   Problem: Respiratory: Goal: Ability to maintain a clear airway and adequate ventilation will improve Outcome: Progressing   Problem: Health Behavior/Discharge Planning: Goal: Ability to manage health-related needs will improve Outcome: Not Progressing   Patient unable to independently manage health care needs at this time.  Problem: Clinical Measurements: Goal: Ability to maintain clinical measurements within normal limits will improve Outcome: Not Progressing  Patient required being  restarted on Levo gtt after CRRT stopped due to SBP 80s and MAP 50s.   Problem: Skin Integrity: Goal: Risk for impaired skin integrity will decrease Outcome: Not Progressing  Patient noted with 2 incontinent episodes of loose bloody stools this shift. Sacrum/gluteal fold noted with some moisture associated breakdown.    Problem: Clinical Measurements: Goal: Dialysis access will remain free of complications Outcome: Not Progressing  Dialysis catheter noted to start bleeding this morning with a steady stream of blood. Manual pressure held for 15 mins and gauze pressure dressing placed. Dressing reinforced a couple of times.     Problem: Role Relationship: Goal: Method of communication will improve Outcome: Not Progressing   Patient unable to follow commands at this time and unable to communicate effectively due to being intubated. Non verbal communication assessed and nonverbal pain scale used to improve patients comfort and attempt to meet patients needs.

## 2018-05-16 NOTE — Progress Notes (Signed)
[facility] Progress Note    Assessment/ Plan:   62 y.o.femalewith history of stage III colon cancer,MDS dx 2017,chronic macrocytic anemia requiring frequent transfusions, DM, HTN, severeAS, severe TR, severe pulmonary hypertension with recent echocardiogram showing RVSP 112 mmHghere withprogressive dyspnea found to have AKI andA. fib with RVR w/ AKI;cr risenfrom 1.05 to 3.37. Eventually intubated forsepsis + metabolic acidosis with a pH of 6.93 + hyperkalemia (K5.4). She also had hypotension req Levophed w/ CXR showing either edema vs PNA. She has had no UOP since admission.  1. Acute Kidney Injury from hypoperfusion + sepsis and currently essentially anuric. Patient almost certainly in ATN and may be in for a protracted course if she recovers. Anuria is not a good sign and her cardiac status is concerning as well with the severe AS and pulm HTN.  - Off CRRT 11/2 AM as filter was to be changed  - Currently off Levophed but BP soft. - Bladder scan 550 evening 10/31 -> foley  - UOP/24hrs ~47m (dark) - Will monitor off CRRT 24-48hrs for recovery or need to dialyze. If pressures improve and she remains dialysis dep will consider iHD but I'm not convinced she will tolerate.  2. Afib RVR - today at time of exam regular rhythm 3. Severe AS and pulm HTN - not stable for more definitive treatments.Per cards no valvulopasty fow now; TAVR only when off CRRT with improvement in overall status. 4. H/o MDS and colon CA 5. DM  Subjective:   Intubated.  Bleeding from left IJ temp cath site and also bloody stool x2.  Not on pressors with MAP hovering ~58-62   Objective:   BP 122/69 (BP Location: Left Arm)   Pulse 100   Temp (!) 97.3 F (36.3 C)   Resp 20   Ht '5\' 4"'  (1.626 m)   Wt 95.3 kg   SpO2 100%   BMI 36.06 kg/m   Intake/Output Summary (Last 24 hours) at 05/16/2018 07035Last data filed at 05/16/2018 0400 Gross per 24 hour  Intake 1933.27 ml  Output  3536 ml  Net -1602.73 ml   Weight change: -3.1 kg  Physical Exam: General appearance:NAD, on vent Head:NCAT Eyes:not opening eyes today Resp:no rhonchi noted Cardio:regular rhythm GKK:XFGHBS but soft Extremities:edemapresent 1+ thigh + UE  Imaging: Dg Chest Port 1 View  Result Date: 05/15/2018 CLINICAL DATA:  Acute respiratory failure. EXAM: PORTABLE CHEST 1 VIEW COMPARISON:  05/13/2018 FINDINGS: Endotracheal tube terminates 5 cm above the carina. Enteric tube courses into the left upper abdomen with tip not imaged. Right chest Port-A-Cath and bilateral jugular catheters all terminate over the SVC, unchanged. The cardiac silhouette appears slightly enlarged. Aortic atherosclerosis is noted. Coarse interstitial type opacities in the perihilar/infrahilar regions bilaterally have not significantly changed. No large pleural effusion or pneumothorax is identified. IMPRESSION: Unchanged appearance of the chest including bilateral basilar predominant infiltrates. Electronically Signed   By: ALogan BoresM.D.   On: 05/15/2018 09:05    Labs: BMET Recent Labs  Lab 05/12/18 1625 05/13/18 0333 05/13/18 1557 05/14/18 0404 05/14/18 1431 05/14/18 1433 05/15/18 0427 05/15/18 1608  NA 137 138 136 137  --  137 136 137  K 3.9 3.3* 3.1* 3.4*  --  4.3 3.7 5.1  CL 96* 100 103 105  --  102 105 102  CO2 12* 19* 21* 24  --  '28 25 29  ' GLUCOSE 195* 212* 246* 251*  --  246* 219* 171*  BUN 52* 43* 30* 22  --  '17 15 14  ' CREATININE 3.61* 2.82* 1.87* 1.47*  --  1.10* 0.88 0.74  CALCIUM 8.1* 7.9* 7.5* 7.7*  --  7.8* 8.0* 7.9*  PHOS 7.2* 2.4* 1.1* <1.0* 2.4* 2.4* <1.0* 1.8*   CBC Recent Labs  Lab 05/11/18 0650  05/13/18 1557 05/14/18 0404 05/15/18 0427 05/16/18 0331  WBC 5.7   < > 6.2 5.5 4.7 5.9  NEUTROABS 3.7  --   --   --   --   --   HGB 6.3*   < > 7.1* 7.5* 8.4* 8.6*  HCT 19.9*   < > 20.1* 22.1* 24.4* 26.3*  MCV 97.1   < > 85.2 87.0 86.8 89.2  PLT 257   < > 128* 126* 109* 109*   < >  = values in this interval not displayed.    Medications:    . sodium chloride   Intravenous Once  . sodium chloride   Intravenous Once  . sodium chloride   Intravenous Once  . chlorhexidine gluconate (MEDLINE KIT)  15 mL Mouth Rinse BID  . Chlorhexidine Gluconate Cloth  6 each Topical Daily  . insulin aspart  0-20 Units Subcutaneous Q4H  . insulin aspart  6 Units Subcutaneous Q6H  . insulin glargine  20 Units Subcutaneous Daily  . latanoprost  1 drop Both Eyes QHS  . mouth rinse  15 mL Mouth Rinse 10 times per day  . midazolam  2 mg Intravenous Once  . pantoprazole sodium  40 mg Per Tube Daily  . sodium chloride flush  10-40 mL Intracatheter Q12H  . THROMBI-PAD  1 each Topical Once      Otelia Santee, MD 05/16/2018, 5:08 AM

## 2018-05-16 NOTE — Progress Notes (Signed)
ABG results reported to Dr. Jimmy Footman, MD. Orders obtained to decrease respiratory rate on ventilator From 30 To 20 as well as decrease tidal volume From 490 To 400 and recheck ABG in the AM. Respiratory therapy made aware.

## 2018-05-16 NOTE — Progress Notes (Signed)
Progress Note  Patient Name: Patricia Mata Date of Encounter: 05/16/2018  Primary Cardiologist: Quay Burow, MD   Subjective   Sedated, mechanically ventilated. On low dose norepi, weaning off.  Inpatient Medications    Scheduled Meds: . sodium chloride   Intravenous Once  . sodium chloride   Intravenous Once  . sodium chloride   Intravenous Once  . chlorhexidine gluconate (MEDLINE KIT)  15 mL Mouth Rinse BID  . Chlorhexidine Gluconate Cloth  6 each Topical Daily  . insulin aspart  0-20 Units Subcutaneous Q4H  . insulin aspart  6 Units Subcutaneous Q6H  . insulin glargine  20 Units Subcutaneous Daily  . latanoprost  1 drop Both Eyes QHS  . mouth rinse  15 mL Mouth Rinse 10 times per day  . midazolam  2 mg Intravenous Once  . pantoprazole sodium  40 mg Per Tube Daily  . sodium chloride flush  10-40 mL Intracatheter Q12H  . THROMBI-PAD  1 each Topical Once   Continuous Infusions: . sodium chloride 10 mL/hr at 05/16/18 0600  . feeding supplement (VITAL HIGH PROTEIN) 1,000 mL (05/15/18 0442)  . fentaNYL infusion INTRAVENOUS Stopped (05/16/18 0436)  . heparin 10,000 units/ 20 mL infusion syringe 1,150 Units/hr (05/16/18 0219)  . norepinephrine (LEVOPHED) Adult infusion 1.5 mcg/min (05/16/18 0600)  . prismasol BGK 4/2.5 500 mL/hr at 05/15/18 1950  . prismasol BGK 4/2.5 300 mL/hr at 05/14/18 2316  . prismasol BGK 4/2.5 1,500 mL/hr at 05/16/18 0243  . sodium chloride    . sodium chloride     PRN Meds: sodium chloride, fentaNYL, heparin, heparin, midazolam, midazolam, sodium chloride, sodium chloride, sodium chloride flush   Vital Signs    Vitals:   05/16/18 0630 05/16/18 0645 05/16/18 0700 05/16/18 0715  BP:      Pulse:      Resp: '20 20 20 20  ' Temp: 98.1 F (36.7 C) 98.2 F (36.8 C) 98.4 F (36.9 C)   TempSrc:   Esophageal   SpO2: 100% 100% 100%   Weight:      Height:        Intake/Output Summary (Last 24 hours) at 05/16/2018 0746 Last data filed at  05/16/2018 0600 Gross per 24 hour  Intake 1676.52 ml  Output 3131 ml  Net -1454.48 ml   Filed Weights   05/14/18 0600 05/15/18 0504 05/16/18 0353  Weight: 98.7 kg 98.4 kg 95.3 kg    Telemetry    Mostly SR/mild STachy, occasional bursts of paroxysmal atrial tachycardia, no true AFib since last evening 2200h. - Personally Reviewed  ECG    SR with a single PAC, lateral T wave inversion, substantially improved QTc 457 ms - Personally Reviewed  Physical Exam  Sedated, intubated GEN: No acute distress.   Neck: No JVD (central venous catheter on L) Cardiac: RRR, very faint aortic systolic murmur, 2/6 holosystolic TR murmur LLSB, normal S2, no rubs or gallops.  Respiratory: Clear to auscultation bilaterally. GI: Soft, nontender, non-distended  MS: No edema; No deformity. Neuro:  Nonfocal  Psych: Normal affect   Labs    Chemistry Recent Labs  Lab 05/13/18 0333  05/15/18 0427 05/15/18 1608 05/16/18 0331  NA 138   < > 136 137 136  K 3.3*   < > 3.7 5.1 3.9  CL 100   < > 105 102 106  CO2 19*   < > '25 29 27  ' GLUCOSE 212*   < > 219* 171* 149*  BUN 43*   < > 15  14 17  CREATININE 2.82*   < > 0.88 0.74 0.83  CALCIUM 7.9*   < > 8.0* 7.9* 8.1*  PROT 5.7*  --   --   --   --   ALBUMIN 3.0*   < > 2.5* 2.4* 2.5*  AST 514*  --   --   --   --   ALT 204*  --   --   --   --   ALKPHOS 100  --   --   --   --   BILITOT 5.5*  --   --   --   --   GFRNONAA 17*   < > >60 >60 >60  GFRAA 19*   < > >60 >60 >60  ANIONGAP 19*   < > 6 6 3*   < > = values in this interval not displayed.     Hematology Recent Labs  Lab 05/14/18 0404 05/15/18 0427 05/16/18 0331  WBC 5.5 4.7 5.9  RBC 2.54* 2.81* 2.95*  HGB 7.5* 8.4* 8.6*  HCT 22.1* 24.4* 26.3*  MCV 87.0 86.8 89.2  MCH 29.5 29.9 29.2  MCHC 33.9 34.4 32.7  RDW 16.2* 16.4* 17.5*  PLT 126* 109* 109*    Cardiac Enzymes Recent Labs  Lab 05/11/18 2215 05/14/18 0854 05/14/18 1431 05/14/18 2037  TROPONINI 0.27* 0.15* 0.15* 0.13*      Recent Labs  Lab 05/11/18 0655  TROPIPOC 0.03     BNP Recent Labs  Lab 05/11/18 0650 05/13/18 0333  BNP 2,681.1* 3,571.3*     DDimer No results for input(s): DDIMER in the last 168 hours.   Radiology    Dg Chest Port 1 View  Result Date: 05/16/2018 CLINICAL DATA:  Acute respiratory failure. Myelodysplastic syndrome. EXAM: PORTABLE CHEST 1 VIEW COMPARISON:  One-view chest x-ray 05/15/2018 FINDINGS: The heart size is normal. Endotracheal tube is stable in position. The NG tube courses off the inferior border of the film. A left IJ line and right subclavian Port-A-Cath are stable. Lung volumes remain low. Mild pulmonary vascular congestion is unchanged. Left greater than right bibasilar airspace disease likely reflects atelectasis. IMPRESSION: 1. Stable low lung volumes and mild bibasilar airspace disease, likely atelectasis. 2. Stable mild pulmonary vascular congestion. 3. Support apparatus is stable. Electronically Signed   By: San Morelle M.D.   On: 05/16/2018 07:39   Dg Chest Port 1 View  Result Date: 05/15/2018 CLINICAL DATA:  Acute respiratory failure. EXAM: PORTABLE CHEST 1 VIEW COMPARISON:  05/13/2018 FINDINGS: Endotracheal tube terminates 5 cm above the carina. Enteric tube courses into the left upper abdomen with tip not imaged. Right chest Port-A-Cath and bilateral jugular catheters all terminate over the SVC, unchanged. The cardiac silhouette appears slightly enlarged. Aortic atherosclerosis is noted. Coarse interstitial type opacities in the perihilar/infrahilar regions bilaterally have not significantly changed. No large pleural effusion or pneumothorax is identified. IMPRESSION: Unchanged appearance of the chest including bilateral basilar predominant infiltrates. Electronically Signed   By: Logan Bores M.D.   On: 05/15/2018 09:05    Cardiac Studies   ECHO 05/12/2017 - Left ventricle: The cavity size was normal. There was moderate   concentric hypertrophy.  Systolic function was vigorous. The   estimated ejection fraction was in the range of 65% to 70%. Wall   motion was normal; there were no regional wall motion   abnormalities. Features are consistent with a pseudonormal left   ventricular filling pattern, with concomitant abnormal relaxation   and increased filling pressure (grade 2 diastolic  dysfunction).   Doppler parameters are consistent with high ventricular filling   pressure. - Aortic valve: Trileaflet; moderately thickened, moderately   calcified leaflets. Valve mobility was restricted. There was   moderate stenosis. Mean gradient (S): 22 mm Hg. VTI ratio of LVOT   to aortic valve: 0.37. Valve area (VTI): 1.28 cm^2. Valve area   (Vmax): 1.01 cm^2. Valve area (Vmean): 1.18 cm^2. - Mitral valve: Calcified annulus. Moderate diffuse thickening,   calcification, and elongation. The findings are consistent with   mild to moderate stenosis. Deceleration time: 324 ms. - Tricuspid valve: There was moderate regurgitation. - Pulmonic valve: There was trivial regurgitation. - Pulmonary arteries: PA peak pressure: 93 mm Hg (S).  Impressions:  - The right ventricular systolic pressure was increased consistent   with severe pulmonary hypertension.  Patient Profile     62 y.o. female with shock and respiratory failure triggered by sepsis on background of severe aortic stenosis and severe pulmonary artery hypertension, complicated by paroxysmal atrial fibrillation, QT prolongation and oligoanuric renal faulure  Assessment & Plan    1. Shock:  Pressors weaned off, restarted, but likely will be able to come off today. Sepsis component seems to have resolved. Shock liver improved. 2. PAH:  Severe, chronic, with severe TR. In my opinion this is an even more serious hemodynamic challenge than the aortic stenosis. Cause is unclear (although there is clear evidence for L heart failure due to AS and diastolic dysfunction, the PAH appears  disproportionately severe). 3. AS, calcific: this is severe, but not critical. Gradients are exaggerated by the hyperdynamic LV systolic function. There is visible mobility left in the aortic cusps. Reevaluate after resolution of respiratory failure, off pressors. Will need right and left heart cath after resolution of the acute illness. 4. Long QT: markedly improved - may be related to metabolic abnormalities, but could also be due to myocardial ischemia. Continue to avoid offending drugs and keep K/Mg/Ca tightly in normal ranges. 5. Demand ischemia: minor increase in troponin and dynamic ECG changes are consistent with demand ischemia in setting of major hemodynamic changes, probably with underlying CAD. Not c./w true acute atherothrombotic event. Preserved LVEF. 6. Acute renal failure: oliguric (400 mL/24h). Off CRRT. 7. Acute hypoxic resp failure:  For new attempt at weaning sedation today? 8. AFib: maintaining SR, likely to be less of an issue as internal adrenergic drive and use of pressors diminish. 9. Anemia: bears dx of myelodysplasia, has required multiple transfusions. Further aggravating factor in setting of PAH and AS.  TIME SPENT WITH PATIENT: 45 minutes of direct patient care. More than 50% of that time was spent on coordination of care and counseling regarding a critical ill-patient with multiple organ system dysfunction, titrating medications, evaluating databases.      For questions or updates, please contact [facility] Please consult [email or web address] for contact info under        Signed, Sanda Klein, MD  05/16/2018, 7:46 AM

## 2018-05-16 NOTE — Progress Notes (Addendum)
Renal function panel delayed due to PRBC infusing.  Will be collected 2 hour post transfusion.

## 2018-05-16 NOTE — Progress Notes (Signed)
Mechanical Ventilatory settings changed per MD. Orders obtained to decrease set respirations from 30 to 20 and decrease VT. Settings adjusted to support MD orders.

## 2018-05-16 NOTE — Progress Notes (Signed)
On call MD notified and updated regarding patient having grossly positive Hemoccult stool per visual inspection. Orders received and carried out. Will continue to monitor.

## 2018-05-16 NOTE — Progress Notes (Addendum)
NAME:  Patricia Mata, MRN:  [id number], DOB:  [birth year], LOS: 5 ADMISSION DATE:  05/11/2018, CONSULTATION DATE:  10/28 REFERRING MD:  EDP, CHIEF COMPLAINT:  AFib RVR    Brief History   62yo female with hx severe AS, severe TR, severe pulmonary HTN, stage 3 colon cancer and myelodysplastic syndrome and anemia requiring frequent transfusions presented 10/28 with dyspnea found to have AFib with RVR, acute on chronic anemia and AKI.  Cardiology requesting ICU admission per PCCM.  She developed severe metabolic acidosis and shock requiring mechanical ventilation  Past Medical History  Severe AS she Severe TR Severe pulmonary hypertension Stage III colon cancer Myelodysplastic syndrome-diagnosed 2017 Chronic microcytic anemia Hypertension Diabetes Significant Hospital Events   10/29 early am  Intubated emergently>> suspected sepsis>>severe  metabolic acidosis 11/91 CRRT  Consults: date of consult/date signed off & final recs:  Cardiology 10/28  Procedures (surgical and bedside):  10/29>> ETT>> 10/29>> RIJ CVC>> 10/29>> A Line 10/29>> LIJ HD cath  Significant Diagnostic Tests:  10/25 CT abdomen and pelvis Heterogeneous hepatic parenchyma  ? Due to  hepatic venous congestion. Small amount of perihepatic ascites and free fluid in the pelvis.    Micro Data:  10/29>> Blood ng 10/31>> Urine 10/29>> Sputum >>ng  Antimicrobials:  10/29 Merrem >>11/1 10/29 Vanc >> 10/31  Subjective:   Remains critically ill but improving. She is orally intubated, CRRT has been discontinued to assess for recovery vs need for HD Sedated on fentanyl drip at 50 mg   Objective   Blood pressure (!) 122/52, pulse 95, temperature 98.4 F (36.9 C), temperature source Esophageal, resp. rate (!) 21, height 5\' 4"  (1.626 m), weight 95.3 kg, SpO2 100 %. CVP:  [6 mmHg] 6 mmHg  Vent Mode: PRVC FiO2 (%):  [35 %-40 %] 35 % Set Rate:  [20 bmp-30 bmp] 20 bmp Vt Set:  [400 mL-490 mL] 400 mL PEEP:  [5 cmH20]  5 cmH20 Pressure Support:  [12 cmH20] 12 cmH20 Plateau Pressure:  [15 cmH20-20 cmH20] 18 cmH20   Intake/Output Summary (Last 24 hours) at 05/16/2018 1014 Last data filed at 05/16/2018 0600 Gross per 24 hour  Intake 1306.17 ml  Output 2801 ml  Net -1494.83 ml   Filed Weights   05/14/18 0600 05/15/18 0504 05/16/18 0353  Weight: 98.7 kg 98.4 kg 95.3 kg    Sedated on  fentanyl drip, CRRT off , acutely ill Pallor +, no icterus No JVD, 1+ edema Bilateral chest excursion,Clear breath sounds bilaterally, no rhonchi, diminished per bases Ejection systolic murmur 2/6 at base, S1-S2 normal, NSR per tele Soft nontender abdomen, ND, BS + RA SS -1, does not follow commands  Chest x-ray from 11/2 personally reviewed,  Stable mild vascular congestion per CXR   Resolved Hospital Problem list     Assessment & Plan:  Cardiogenic shock  Plan: Levophed off, goal SBP 100 & above BP soft long QTc, avoid QT prolonging drugs Tight control of mag, K and Ca to maintain WNL Calcium corrects to 9.3  A. fib with RVR>> rate now controlled and in Sinus Rhythm Plan- If recurs, consider amio Holding on heparin given significant anemia Can restart home metoprolol if blood pressure improves   Severe aortic stenosis Severe TR Severe pulmonary hypertension - PA peak pressure >125mmHg Hx HTN Plan- Cardiology following Not a candidate for TAVR/ balloon Valvuloplasty at this time, will need to extubate  before being considered for valve replacement Will need R and L heart cath after resolution of critical illness  Acute Respiratory Failure Post Arrest Stable mild vascular congestion per CXR Plan: SBT as tolerated Minimize sedation CXR prn Titrate oxygen and PEEP to Maintain sats > 94%   Doubt sepsis, cultures neg Dc meropenem  AKI  Severe metabolic acidosis>> correcting Initiation of urine output 397 cc last 24 Plan CRRT off Monitor for next  24-48hrs for recovery or need to dialyze.    If pressures improve and she remains dialysis dep will consider iHD if she can tolerate.  Acute on chronic anemia s/p 4u PRBC so far Myelodysplastic syndrome Thrombocytopenia>> 109,000 New bleeding per rectum 11/2 x 2  Scan oozing from CVC site HGB stable Plan- Goal hemoglobin greater than 8 Follow-up CBC  Trend coags Consider GI consult    Hypomagnesemia Hyperkalemia Severe hypo-phos Plan- repleted  Nutrition Hypoalbuminemia  Plan Continue TF   DM-2, uncontrolled  Plan- SSI Increased lantus to 20 U daily Add TF coverage  Disposition / Summary of Today's Plan 05/16/18    Appears to have improved somewhat, off pressors and making urine. Apnea with SBT trials Sedation off, will try again when more awake CRRT off, 300 c urine over the last 24 hours Blood Stools x 2 per nursing, HGB stable, PLTS 109,000 Care coordinated with cardiology and nephrology     Code Status: Full code Family Communication:edside   Magdalen Spatz, AGACNP-BC [facility] Pager # [phone] After 3 pm call [phone] 05/16/2018  Attending Note:  62 year old female with extensive PMH including pulmonary HTN, heart failure and stage 3 colon cancer with MDS who presents to PCCM with respiratory failure, renal failure and heart failure.  On exam, diffuse crackles persist.  I reviewed CXR myself, pulmonary edema noted with ETT in a good position.  Discussed with PCCM-NP.  Will begin weaning efforts.  Minimize sedation as able as patient takes sometime to wake up from narcotics.  Continue volume negative as able given hypotension.  Levophed for BP support as needed.  Hold off extubation today.  The patient is critically ill with multiple organ systems failure and requires high complexity decision making for assessment and support, frequent evaluation and titration of therapies, application of advanced monitoring technologies and extensive interpretation of  multiple databases.   Critical Care Time devoted to patient care services described in this note is  33  Minutes. This time reflects time of care of this signee Dr Jennet Maduro. This critical care time does not reflect procedure time, or teaching time or supervisory time of PA/NP/Med student/Med Resident etc but could involve care discussion time.  Rush Farmer, M.D. [facility] Pulmonary/Critical Care Medicine. Pager: [phone]. After hours pager: [phone].  05/16/2018 10:14 AM

## 2018-05-16 NOTE — Progress Notes (Signed)
SBT attempted multiple times throughout shift today.  Pt has failed each time due to apnea.  RT will continue to monitor

## 2018-05-16 NOTE — Procedures (Signed)
Arterial Catheter Insertion Procedure Note Patricia Mata [id number] [birth year]  Procedure: Insertion of Arterial Catheter  Indications: Blood pressure monitoring  Procedure Details Consent: Risks of procedure as well as the alternatives and risks of each were explained to the (patient/caregiver).  Consent for procedure obtained. telephone consent obtained from son Patricia Mata. Time Out: Verified patient identification, verified procedure, site/side was marked, verified correct patient position, special equipment/implants available, medications/allergies/relevent history reviewed, required imaging and test results available.  Performed  Maximum sterile technique was used including antiseptics, cap, gloves, gown, hand hygiene and mask. Skin prep: Chlorhexidine; local anesthetic administered 20 gauge catheter was inserted into left radial artery using the Seldinger technique. ULTRASOUND GUIDANCE USED: NO Evaluation Blood flow good; BP tracing good. Complications: No apparent complications.  Left Radial Arterial line place by this RRT assisted by [facility], RRT. Good blood return noted. Line flushes without complications. arterial line is ready for use. RN aware of placement.  Patricia Mata, B.S, RRT, RCP 05/16/2018

## 2018-05-17 ENCOUNTER — Inpatient Hospital Stay (HOSPITAL_COMMUNITY): Payer: BC Managed Care – PPO

## 2018-05-17 DIAGNOSIS — E8770 Fluid overload, unspecified: Secondary | ICD-10-CM

## 2018-05-17 LAB — RENAL FUNCTION PANEL
Albumin: 2.3 g/dL — ABNORMAL LOW (ref 3.5–5.0)
Anion gap: 7 (ref 5–15)
BUN: 43 mg/dL — ABNORMAL HIGH (ref 8–23)
CO2: 25 mmol/L (ref 22–32)
Calcium: 9.2 mg/dL (ref 8.9–10.3)
Chloride: 105 mmol/L (ref 98–111)
Creatinine, Ser: 1.1 mg/dL — ABNORMAL HIGH (ref 0.44–1.00)
GFR calc Af Amer: >60 mL/min (ref >60)
GFR calc non Af Amer: 52 mL/min — ABNORMAL LOW (ref >60)
Glucose, Bld: 156 mg/dL — ABNORMAL HIGH (ref 70–99)
Phosphorus: 5.2 mg/dL — ABNORMAL HIGH (ref 2.5–4.6)
Potassium: 4 mmol/L (ref 3.5–5.1)
Sodium: 137 mmol/L (ref 135–145)

## 2018-05-17 LAB — TYPE AND SCREEN
ABO/RH(D): A NEG
Antibody Screen: NEGATIVE
Unit division: 0

## 2018-05-17 LAB — CULTURE, BLOOD (ROUTINE X 2)
Culture: NO GROWTH
Culture: NO GROWTH
Special Requests: ADEQUATE

## 2018-05-17 LAB — COMPREHENSIVE METABOLIC PANEL WITH GFR
ALT: 120 U/L — ABNORMAL HIGH (ref 0–44)
AST: 192 U/L — ABNORMAL HIGH (ref 15–41)
Albumin: 2.1 g/dL — ABNORMAL LOW (ref 3.5–5.0)
Alkaline Phosphatase: 108 U/L (ref 38–126)
Anion gap: 5 (ref 5–15)
BUN: 35 mg/dL — ABNORMAL HIGH (ref 8–23)
CO2: 26 mmol/L (ref 22–32)
Calcium: 8.7 mg/dL — ABNORMAL LOW (ref 8.9–10.3)
Chloride: 105 mmol/L (ref 98–111)
Creatinine, Ser: 1.03 mg/dL — ABNORMAL HIGH (ref 0.44–1.00)
GFR calc Af Amer: >60 mL/min
GFR calc non Af Amer: 57 mL/min — ABNORMAL LOW
Glucose, Bld: 154 mg/dL — ABNORMAL HIGH (ref 70–99)
Potassium: 4 mmol/L (ref 3.5–5.1)
Sodium: 136 mmol/L (ref 135–145)
Total Bilirubin: 5.8 mg/dL — ABNORMAL HIGH (ref 0.3–1.2)
Total Protein: 5.4 g/dL — ABNORMAL LOW (ref 6.5–8.1)

## 2018-05-17 LAB — CBC
HCT: 24.5 % — ABNORMAL LOW (ref 36.0–46.0)
Hemoglobin: 8.2 g/dL — ABNORMAL LOW (ref 12.0–15.0)
MCH: 29.4 pg (ref 26.0–34.0)
MCHC: 33.5 g/dL (ref 30.0–36.0)
MCV: 87.8 fL (ref 80.0–100.0)
Platelets: 98 10*3/uL — ABNORMAL LOW (ref 150–400)
RBC: 2.79 MIL/uL — ABNORMAL LOW (ref 3.87–5.11)
RDW: 17.6 % — ABNORMAL HIGH (ref 11.5–15.5)
WBC: 6 10*3/uL (ref 4.0–10.5)
nRBC: 0.7 % — ABNORMAL HIGH (ref 0.0–0.2)

## 2018-05-17 LAB — GLUCOSE, CAPILLARY
Glucose-Capillary: 103 mg/dL — ABNORMAL HIGH (ref 70–99)
Glucose-Capillary: 143 mg/dL — ABNORMAL HIGH (ref 70–99)
Glucose-Capillary: 180 mg/dL — ABNORMAL HIGH (ref 70–99)
Glucose-Capillary: 133 mg/dL — ABNORMAL HIGH (ref 70–99)
Glucose-Capillary: 134 mg/dL — ABNORMAL HIGH (ref 70–99)
Glucose-Capillary: 121 mg/dL — ABNORMAL HIGH (ref 70–99)

## 2018-05-17 LAB — APTT: aPTT: 35 s (ref 24–36)

## 2018-05-17 LAB — BPAM RBC
Blood Product Expiration Date: 201911272359
ISSUE DATE / TIME: 201911021603
Unit Type and Rh: 600

## 2018-05-17 LAB — PROTIME-INR
INR: 1.2
Prothrombin Time: 15.1 s (ref 11.4–15.2)

## 2018-05-17 LAB — MAGNESIUM: Magnesium: 2 mg/dL (ref 1.7–2.4)

## 2018-05-17 LAB — PHOSPHORUS: Phosphorus: 4.1 mg/dL (ref 2.5–4.6)

## 2018-05-17 MED ORDER — ONDANSETRON HCL 4 MG/2ML IJ SOLN
4.0000 mg | Freq: Once | INTRAMUSCULAR | Status: AC
  Administered 2018-05-17: 4 mg via INTRAVENOUS

## 2018-05-17 MED ORDER — FUROSEMIDE 10 MG/ML IJ SOLN
40.0000 mg | Freq: Four times a day (QID) | INTRAMUSCULAR | Status: AC
  Administered 2018-05-17 (×3): 40 mg via INTRAVENOUS
  Filled 2018-05-17 (×3): qty 4

## 2018-05-17 MED ORDER — ONDANSETRON HCL 4 MG/2ML IJ SOLN
INTRAMUSCULAR | Status: AC
  Administered 2018-05-17: 4 mg via INTRAVENOUS
  Filled 2018-05-17: qty 2

## 2018-05-17 MED ORDER — PNEUMOCOCCAL VAC POLYVALENT 25 MCG/0.5ML IJ INJ: 0.5000 mL | INJECTION | INTRAMUSCULAR | Status: AC | PRN

## 2018-05-17 NOTE — Plan of Care (Signed)
  Problem: Health Behavior/Discharge Planning: Goal: Ability to manage health-related needs will improve Outcome: Progressing   Problem: Clinical Measurements: Goal: Ability to maintain clinical measurements within normal limits will improve Outcome: Progressing Goal: Will remain free from infection Outcome: Progressing Goal: Diagnostic test results will improve Outcome: Progressing Goal: Respiratory complications will improve Outcome: Progressing Goal: Cardiovascular complication will be avoided Outcome: Progressing   Problem: Activity: Goal: Risk for activity intolerance will decrease Outcome: Progressing   Problem: Nutrition: Goal: Adequate nutrition will be maintained Outcome: Progressing   Problem: Coping: Goal: Level of anxiety will decrease Outcome: Progressing   Problem: Elimination: Goal: Will not experience complications related to bowel motility Outcome: Progressing Goal: Will not experience complications related to urinary retention Outcome: Progressing   Problem: Pain Managment: Goal: General experience of comfort will improve Outcome: Progressing   Problem: Safety: Goal: Ability to remain free from injury will improve Outcome: Progressing   Problem: Health Behavior/Discharge Planning: Goal: Ability to manage health-related needs will improve Outcome: Progressing   Problem: Clinical Measurements: Goal: Complications related to the disease process or treatment will be avoided or minimized Outcome: Progressing Goal: Dialysis access will remain free of complications Outcome: Progressing   Problem: Activity: Goal: Activity intolerance will improve Outcome: Progressing   Problem: Fluid Volume: Goal: Fluid volume balance will be maintained or improved Outcome: Progressing   Problem: Respiratory: Goal: Respiratory symptoms related to disease process will be avoided Outcome: Progressing   Problem: Self-Concept: Goal: Body image disturbance will be  avoided or minimized Outcome: Progressing   Problem: Urinary Elimination: Goal: Progression of disease will be identified and treated Outcome: Progressing   Problem: Respiratory: Goal: Ability to maintain a clear airway and adequate ventilation will improve Outcome: Progressing   Problem: Role Relationship: Goal: Method of communication will improve Outcome: Progressing

## 2018-05-17 NOTE — Progress Notes (Signed)
[facility] Progress Note    Assessment/ Plan:   62 y.o.femalewith history of stage III colon cancer,MDS dx 2017,chronic macrocytic anemia requiring frequent transfusions, DM, HTN, severeAS, severe TR, severe pulmonary hypertension with recent echocardiogram showing RVSP 112 mmHghere withprogressive dyspnea found to have AKI andA. fib with RVR w/ AKI;cr risenfrom 1.05 to 3.37. Eventually intubated forsepsis + metabolic acidosis with a pH of 6.93 + hyperkalemia (K5.4). She also had hypotension req Levophed w/ CXR showing either edema vs PNA. She has had no UOP since admission.  1. Acute Kidney Injury from hypoperfusion + sepsis and currently essentially anuric. Patient almost certainly in ATN and may be in for a protracted course if she recovers. Anuria is not a good sign and her cardiac status is concerning as well with the severe AS and pulm HTN.  - Off CRRT 11/2 AM as filter was to be changed  - Currently off pressors  - Bladder scan 550 evening 10/31 -> foley             - UOP/24hrs ~906 ml  - No RRT indicated; she may be starting to recover renal function w/ UOP picking up over the past 24hrs .   2. Afib RVR - rate controlledtoday at time of exam regular rhythm 3. Severe AS and pulm HTN - not stable for more definitive treatments.Per cards no valvulopasty fow now; TAVR only when off CRRT with improvement in overall status. 4. H/o MDS and colon CA 5. DM  Subjective:   Intubated, not tolerating SBT.  No further bleeding from left IJ temp cath site; also no further bloody stool still yest AM.  Off pressors as well.   Objective:   BP (!) 139/47   Pulse 85   Temp 98.4 F (36.9 C) (Core)   Resp (!) 25   Ht _0  (1.626 m)   Wt 96.2 kg   SpO2 98%   BMI 36.40 kg/m   Intake/Output Summary (Last 24 hours) at 05/17/2018 0804 Last data filed at 05/17/2018 0600 Gross per 24 hour  Intake 2053.11 ml  Output 880 ml  Net 1173.11 ml   Weight  change: 0.9 kg  Physical Exam: General appearance:NAD, on vent Head:NCAT Eyes:Opening eyes today Resp:no rhonchi noted (just suctioned) Cardio:irregular rhythm AP:OLID BS but soft Extremities:edemapresent  GU: foley in place  Imaging: Dg Chest Port 1 View  Result Date: 05/17/2018 CLINICAL DATA:  Respiratory failure. EXAM: PORTABLE CHEST 1 VIEW COMPARISON:  05/16/2018 FINDINGS: Stable support apparatus. Stably enlarged cardiac silhouette. Persistent mild peribronchial airspace opacities. Osseous structures are without acute abnormality. Soft tissues are grossly normal. IMPRESSION: Stable support apparatus. Mild peribronchial airspace opacities may represent atelectasis or airspace consolidation. Electronically Signed   By: Fidela Salisbury M.D.   On: 05/17/2018 07:48   Dg Chest Port 1 View  Result Date: 05/16/2018 CLINICAL DATA:  Acute respiratory failure. Myelodysplastic syndrome. EXAM: PORTABLE CHEST 1 VIEW COMPARISON:  One-view chest x-ray 05/15/2018 FINDINGS: The heart size is normal. Endotracheal tube is stable in position. The NG tube courses off the inferior border of the film. A left IJ line and right subclavian Port-A-Cath are stable. Lung volumes remain low. Mild pulmonary vascular congestion is unchanged. Left greater than right bibasilar airspace disease likely reflects atelectasis. IMPRESSION: 1. Stable low lung volumes and mild bibasilar airspace disease, likely atelectasis. 2. Stable mild pulmonary vascular congestion. 3. Support apparatus is stable. Electronically Signed   By: San Morelle M.D.   On: 05/16/2018 07:39  Labs: BMET Recent Labs  Lab 05/14/18 0404 05/14/18 1431 05/14/18 1433 05/15/18 0427 05/15/18 1608 05/16/18 0331 05/16/18 2044 05/17/18 0345  NA 137  --  137 136 137 136 134* 136  K 3.4*  --  4.3 3.7 5.1 3.9 4.0 4.0  CL 105  --  102 105 102 106 104 105  CO2 24  --  _0 GLUCOSE 251*  --  246* 219* 171* 149* 136* 154*   BUN 22  --  _1 31* 35*  CREATININE 1.47*  --  1.10* 0.88 0.74 0.83 1.05* 1.03*  CALCIUM 7.7*  --  7.8* 8.0* 7.9* 8.1* 8.3* 8.7*  PHOS <1.0* 2.4* 2.4* <1.0* 1.8* 2.5 3.5 4.1   CBC Recent Labs  Lab 05/11/18 0650  05/15/18 0427 05/16/18 0331 05/16/18 1200 05/17/18 0345  WBC 5.7   < > 4.7 5.9 5.9 6.0  NEUTROABS 3.7  --   --   --   --   --   HGB 6.3*   < > 8.4* 8.6* 7.4* 8.2*  HCT 19.9*   < > 24.4* 26.3* 23.4* 24.5*  MCV 97.1   < > 86.8 89.2 90.3 87.8  PLT 257   < > 109* 109* 100* 98*   < > = values in this interval not displayed.    Medications:    . sodium chloride   Intravenous Once  . sodium chloride   Intravenous Once  . sodium chloride   Intravenous Once  . chlorhexidine gluconate (MEDLINE KIT)  15 mL Mouth Rinse BID  . Chlorhexidine Gluconate Cloth  6 each Topical Daily  . insulin aspart  0-20 Units Subcutaneous Q4H  . insulin aspart  6 Units Subcutaneous Q6H  . insulin glargine  20 Units Subcutaneous Daily  . latanoprost  1 drop Both Eyes QHS  . mouth rinse  15 mL Mouth Rinse 10 times per day  . pantoprazole sodium  40 mg Per Tube Daily  . pneumococcal 23 valent vaccine  0.5 mL Intramuscular Tomorrow-1000  . sodium chloride flush  10-40 mL Intracatheter Q12H      Otelia Santee, MD 05/17/2018, 8:04 AM

## 2018-05-17 NOTE — Progress Notes (Addendum)
NAME:  Patricia Mata, MRN:  [id number], DOB:  1955/08/29, LOS: 6 ADMISSION DATE:  05/11/2018, CONSULTATION DATE:  10/28 REFERRING MD:  EDP, CHIEF COMPLAINT:  AFib RVR    Brief History   62yo female with hx severe AS, severe TR, severe pulmonary HTN, stage 3 colon cancer and myelodysplastic syndrome and anemia requiring frequent transfusions presented 10/28 with dyspnea found to have AFib with RVR, acute on chronic anemia and AKI.  Cardiology requesting ICU admission per PCCM.  She developed severe metabolic acidosis and shock requiring mechanical ventilation  Past Medical History  Severe AS she Severe TR Severe pulmonary hypertension Stage III colon cancer Myelodysplastic syndrome-diagnosed 2017 Chronic microcytic anemia Hypertension Diabetes Significant Hospital Events   10/29 early am  Intubated emergently>> suspected sepsis>>severe  metabolic acidosis 65/79 CRRT initiated  Consults: date of consult/date signed off & final recs:  Cardiology 10/28  Procedures (surgical and bedside):  10/29>> ETT>> 10/29>> RIJ CVC>> 10/29>> A Line 10/29>> LIJ HD cath  Significant Diagnostic Tests:  10/25 CT abdomen and pelvis Heterogeneous hepatic parenchyma  ? Due to  hepatic venous congestion. Small amount of perihepatic ascites and free fluid in the pelvis.    Micro Data:  10/29>> Blood ng 10/31>> Urine>> No growth 10/29>> Sputum >>ng  Antimicrobials:  10/29 Merrem >>11/1 10/29 Vanc >> 10/31  Subjective:   Remains critically ill but improving. She is more alert, 600cc of urine output overnight She is orally intubated, CRRT has been discontinued to assess for recovery vs need for HD Sedated on Precedex gtt . She did require a one time dose of 50 mcg of Fentanyl for agitation not managed by precedex   T Max 100.4 +2800 cc  Objective   Blood pressure (!) 139/47, pulse 85, temperature 98.4 F (36.9 C), temperature source Core, resp. rate (!) 25, height 5\' 4"  (1.626 m), weight  96.2 kg, SpO2 98 %.    Vent Mode: PRVC FiO2 (%):  [30 %-35 %] 30 % Set Rate:  [20 bmp] 20 bmp Vt Set:  [400 mL] 400 mL PEEP:  [5 cmH20] 5 cmH20 Pressure Support:  [12 cmH20] 12 cmH20 Plateau Pressure:  [10 cmH20-18 cmH20] 17 cmH20   Intake/Output Summary (Last 24 hours) at 05/17/2018 0745 Last data filed at 05/17/2018 0600 Gross per 24 hour  Intake 2110.61 ml  Output 906 ml  Net 1204.61 ml   Filed Weights   05/15/18 0504 05/16/18 0353 05/17/18 0500  Weight: 98.4 kg 95.3 kg 96.2 kg    Sedated on  Precedex  drip, CRRT off , acutely ill Pallor +, no icterus No JVD, 1+ edema Bilateral chest excursion,Coarse  breath sounds bilaterally, few  rhonchi, diminished per bases, crackles per bases Ejection systolic murmur 2/6 at base, S1-S2 normal, NSR per tele Soft, nontender abdomen, ND, BS +, tolerating TF RA SS -1, does not follow commands  Chest x-ray from 11/3 personally reviewed,  Mild peribronchial airspace opacities may represent atelectasis or airspace consolidation  Resolved Hospital Problem list     Assessment & Plan:  Cardiogenic shock  Plan: Levophed off, goal SBP 100 & above BP stable long QTc, avoid QT prolonging drugs Tight control of mag, K and Ca to maintain WNL Calcium corrects to 9.3  A. fib with RVR>> rate now controlled and in Sinus Rhythm Plan- If recurs, consider amio Holding on heparin given significant anemia Can restart home metoprolol once  blood pressure improves   Severe aortic stenosis Severe TR Severe pulmonary hypertension - PA peak pressure >  140mmHg Hx HTN Plan- Cardiology following Not a candidate for TAVR/ balloon Valvuloplasty at this time, will need to extubate  before being considered for valve replacement Will need R and L heart cath after resolution of critical illness Plan Tele EKG prn Tight control of mag, K and Ca to maintain WNL   Acute Respiratory Failure Post Arrest Stable mild vascular congestion per CXR Switched to  precedex as Fentanyl too sedating Plan: SBT as tolerated Minimize sedation CXR prn Titrate oxygen and PEEP to Maintain sats > 94%   Doubt sepsis, cultures neg Dc meropenem  AKI  Severe metabolic acidosis>> correcting Initiation of urine output last 24 hours is 906 cc Plan CRRT off Monitor for next  24-48hrs for recovery or need to dialyze.  If pressures improve and she remains dialysis dep will consider iHD if she can tolerate.  Acute on chronic anemia s/p 5u PRBC so far, last 11/2 Myelodysplastic syndrome Thrombocytopenia>> 98,000 Bleeding per rectum has stopped,  Oozing from CVC site has stopped HGB stable INR is 1.2 Plan- Goal hemoglobin greater than 8 Follow-up CBC daily Trend coags  Shock Liver LFT's are down trending Plan: Trend LFT's until WNL Avoid hepato-toxic medications No Tylenol   Hypomagnesemia Hyperkalemia Severe hypo-phos Plan- repleted  Nutrition Hypoalbuminemia  Plan Continue TF   DM-2, uncontrolled  Plan- SSI Increased lantus to 20 U daily Add TF coverage  Disposition / Summary of Today's Plan 05/17/18    Appears to have improved somewhat, off pressors and making urine. Apnea with SBT trials Sedation off, will try again when more awake CRRT remains  off, 900 c urine over the last 24 hours Blood per Stools resolved, HGB stable, PLTS dropped to  98,000. Care coordinated with cardiology and nephrology     Code Status: Full code Family Communication: No family at bedside Magdalen Spatz, AGACNP-BC [place] Pager # [phone] After 3 pm call [phone]   05/17/2018 7:45 AM   Attending Note:  62 year old female female with PMH above who presents to PCCM with respiratory failure and renal failure.  On exam, the patient is slow to respond but easily arousable and interactive but falls back asleep easily with clear lungs.  I reviewed CXR myself, ETT is in a good position.  Discussed with  PCCM-NP.  Will proceed with weaning today.  If patient wakes up more and is not requiring to be reminded to breath then will extubate today if not then hold all sedation and extubate in AM.  Family notified that if not today then tomorrow will expect extubation.  D/C pressors and begin active diureses today.  PCCM will continue to follow.  The patient is critically ill with multiple organ systems failure and requires high complexity decision making for assessment and support, frequent evaluation and titration of therapies, application of advanced monitoring technologies and extensive interpretation of multiple databases.   Critical Care Time devoted to patient care services described in this note is  36  Minutes. This time reflects time of care of this signee Dr Jennet Maduro. This critical care time does not reflect procedure time, or teaching time or supervisory time of PA/NP/Med student/Med Resident etc but could involve care discussion time.  Rush Farmer, M.D. [facility] Pulmonary/Critical Care Medicine. Pager: [phone]. After hours pager: [phone].

## 2018-05-17 NOTE — Progress Notes (Signed)
Progress Note  Patient Name: Patricia Mata Date of Encounter: 05/17/2018  Primary Cardiologist: Quay Burow, MD   Subjective   Intubated, sedated. Family updated at bedside.  Inpatient Medications    Scheduled Meds: . sodium chloride   Intravenous Once  . sodium chloride   Intravenous Once  . sodium chloride   Intravenous Once  . chlorhexidine gluconate (MEDLINE KIT)  15 mL Mouth Rinse BID  . Chlorhexidine Gluconate Cloth  6 each Topical Daily  . insulin aspart  0-20 Units Subcutaneous Q4H  . insulin aspart  6 Units Subcutaneous Q6H  . insulin glargine  20 Units Subcutaneous Daily  . latanoprost  1 drop Both Eyes QHS  . mouth rinse  15 mL Mouth Rinse 10 times per day  . pantoprazole sodium  40 mg Per Tube Daily  . pneumococcal 23 valent vaccine  0.5 mL Intramuscular Tomorrow-1000  . sodium chloride flush  10-40 mL Intracatheter Q12H   Continuous Infusions: . sodium chloride Stopped (05/16/18 1815)  . dexmedetomidine (PRECEDEX) IV infusion Stopped (05/17/18 0551)  . feeding supplement (VITAL HIGH PROTEIN) 1,000 mL (05/16/18 1152)  . heparin 10,000 units/ 20 mL infusion syringe 1,150 Units/hr (05/16/18 0219)  . norepinephrine (LEVOPHED) Adult infusion Stopped (05/16/18 2004)  . prismasol BGK 4/2.5 500 mL/hr at 05/15/18 1950  . prismasol BGK 4/2.5 300 mL/hr at 05/14/18 2316  . prismasol BGK 4/2.5 1,500 mL/hr at 05/16/18 0243  . sodium chloride    . sodium chloride     PRN Meds: sodium chloride, fentaNYL, heparin, heparin, sodium chloride, sodium chloride, sodium chloride flush   Vital Signs    Vitals:   05/17/18 0630 05/17/18 0700 05/17/18 0715 05/17/18 0717  BP:    (!) 139/47  Pulse:    85  Resp: 20 (!) 0 10 (!) 25  Temp: 98.2 F (36.8 C) 98.4 F (36.9 C) 98.4 F (36.9 C) 98.4 F (36.9 C)  TempSrc:    Core  SpO2:    98%  Weight:      Height:        Intake/Output Summary (Last 24 hours) at 05/17/2018 0840 Last data filed at 05/17/2018 0800 Gross per 24  hour  Intake 2053.11 ml  Output 955 ml  Net 1098.11 ml   Filed Weights   05/15/18 0504 05/16/18 0353 05/17/18 0500  Weight: 98.4 kg 95.3 kg 96.2 kg    Telemetry    Mostly SR with PACs and runs of PAT, periods of atrial fibrillation, generally rate controlled - Personally Reviewed  ECG    SR w PACs, improved lateral T wave changes, QTc normalized 410 ms. - Personally Reviewed  Physical Exam  intubated GEN: No acute distress.   Neck: No JVD Cardiac: RRR, 8-2/5 aortic systolic murmur, 2/6 holosystolic TR murmur LLSB, normal S2, rubs, or gallops.  Respiratory: Clear to auscultation bilaterally. GI: Soft, nontender, non-distended  MS: No edema; No deformity. Neuro:  sedated, unable to assess Psych:  unable to assess  Labs    Chemistry Recent Labs  Lab 05/13/18 0333  05/16/18 0331 05/16/18 1212 05/16/18 2044 05/17/18 0345  NA 138   < > 136  --  134* 136  K 3.3*   < > 3.9  --  4.0 4.0  CL 100   < > 106  --  104 105  CO2 19*   < > 27  --  25 26  GLUCOSE 212*   < > 149*  --  136* 154*  BUN 43*   < >  17  --  31* 35*  CREATININE 2.82*   < > 0.83  --  1.05* 1.03*  CALCIUM 7.9*   < > 8.1*  --  8.3* 8.7*  PROT 5.7*  --   --  5.6*  --  5.4*  ALBUMIN 3.0*   < > 2.5* 2.2* 2.2* 2.1*  AST 514*  --   --  249*  --  192*  ALT 204*  --   --  151*  --  120*  ALKPHOS 100  --   --  105  --  108  BILITOT 5.5*  --   --  5.7*  --  5.8*  GFRNONAA 17*   < > >60  --  55* 57*  GFRAA 19*   < > >60  --  >60 >60  ANIONGAP 19*   < > 3*  --  5 5   < > = values in this interval not displayed.     Hematology Recent Labs  Lab 05/16/18 0331 05/16/18 1200 05/17/18 0345  WBC 5.9 5.9 6.0  RBC 2.95* 2.59* 2.79*  HGB 8.6* 7.4* 8.2*  HCT 26.3* 23.4* 24.5*  MCV 89.2 90.3 87.8  MCH 29.2 28.6 29.4  MCHC 32.7 31.6 33.5  RDW 17.5* 17.5* 17.6*  PLT 109* 100* 98*    Cardiac Enzymes Recent Labs  Lab 05/11/18 2215 05/14/18 0854 05/14/18 1431 05/14/18 2037  TROPONINI 0.27* 0.15* 0.15* 0.13*      Recent Labs  Lab 05/11/18 0655  TROPIPOC 0.03     BNP Recent Labs  Lab 05/11/18 0650 05/13/18 0333  BNP 2,681.1* 3,571.3*     DDimer No results for input(s): DDIMER in the last 168 hours.   Radiology    Dg Chest Port 1 View  Result Date: 05/17/2018 CLINICAL DATA:  Respiratory failure. EXAM: PORTABLE CHEST 1 VIEW COMPARISON:  05/16/2018 FINDINGS: Stable support apparatus. Stably enlarged cardiac silhouette. Persistent mild peribronchial airspace opacities. Osseous structures are without acute abnormality. Soft tissues are grossly normal. IMPRESSION: Stable support apparatus. Mild peribronchial airspace opacities may represent atelectasis or airspace consolidation. Electronically Signed   By: Fidela Salisbury M.D.   On: 05/17/2018 07:48   Dg Chest Port 1 View  Result Date: 05/16/2018 CLINICAL DATA:  Acute respiratory failure. Myelodysplastic syndrome. EXAM: PORTABLE CHEST 1 VIEW COMPARISON:  One-view chest x-ray 05/15/2018 FINDINGS: The heart size is normal. Endotracheal tube is stable in position. The NG tube courses off the inferior border of the film. A left IJ line and right subclavian Port-A-Cath are stable. Lung volumes remain low. Mild pulmonary vascular congestion is unchanged. Left greater than right bibasilar airspace disease likely reflects atelectasis. IMPRESSION: 1. Stable low lung volumes and mild bibasilar airspace disease, likely atelectasis. 2. Stable mild pulmonary vascular congestion. 3. Support apparatus is stable. Electronically Signed   By: San Morelle M.D.   On: 05/16/2018 07:39    Cardiac Studies   ECHO 05/12/2017 - Left ventricle: The cavity size was normal. There was moderate concentric hypertrophy. Systolic function was vigorous. The estimated ejection fraction was in the range of 65% to 70%. Wall motion was normal; there were no regional wall motion abnormalities. Features are consistent with a pseudonormal left ventricular  filling pattern, with concomitant abnormal relaxation and increased filling pressure (grade 2 diastolic dysfunction). Doppler parameters are consistent with high ventricular filling pressure. - Aortic valve: Trileaflet; moderately thickened, moderately calcified leaflets. Valve mobility was restricted. There was moderate stenosis. Mean gradient (S): 22 mm Hg. VTI ratio of  LVOT to aortic valve: 0.37. Valve area (VTI): 1.28 cm^2. Valve area (Vmax): 1.01 cm^2. Valve area (Vmean): 1.18 cm^2. - Mitral valve: Calcified annulus. Moderate diffuse thickening, calcification, and elongation. The findings are consistent with mild to moderate stenosis. Deceleration time: 324 ms. - Tricuspid valve: There was moderate regurgitation. - Pulmonic valve: There was trivial regurgitation. - Pulmonary arteries: PA peak pressure: 93 mm Hg (S).  Impressions:  - The right ventricular systolic pressure was increased consistent with severe pulmonary hypertension.   Patient Profile     62 y.o. female with shock and respiratory failure triggered by sepsis on background of severe aortic stenosis and severe pulmonary artery hypertension, complicated by paroxysmal atrial fibrillation, QT prolongation and oligoanuric renal faulure  Assessment & Plan      1. Shock:  Resolved. Off pressors. 2. PAH:  Severe, chronic, with severe TR. In my opinion this is an even more serious hemodynamic challenge than the aortic stenosis. Cause is unclear (although there is clear evidence for L heart failure due to AS and diastolic dysfunction, the PAH appears disproportionately severe). 3. AS, calcific: this is severe, but not critical. Gradients are exaggerated by the hyperdynamic LV systolic function. There is visible mobility left in the aortic cusps. Reevaluate after resolution of respiratory failure, off pressors. Will need right and left heart cath after resolution of the acute illness. 4. Long QT:  normalized - may be related to metabolic abnormalities, but could also be due to myocardial ischemia (ischemia could be due to CAD or just hemodynamic extremes in setting of severe AS and LVH). 5. Demand ischemia: minor increase in troponin and dynamic ECG changes are consistent with demand ischemia in setting of major hemodynamic changes, possibly with underlying CAD. Not c./w true acute atherothrombotic event. Preserved LVEF. 6. Acute renal failure: oliguric, but clearly improving (900 mL/24h). Off CRRT with stable renal parameters and electrolytes.. 7. Acute hypoxic resp failure:  Looks like a better chance of weaning the ventilator today. 8. AFib: occasional brief episodes persist, mild RVR. On heparin IV. 9. Anemia: bears dx of myelodysplasia, has required multiple transfusions. Stable Hgb. Further aggravating factor in setting of PAH and AS.  She remains critically ill, despite multiple areas of improvement. Overall prognosis extremely guarded.  CRITICAL CARE Performed by: Dani Gobble Chiara Coltrin   Total critical care time: 35 minutes  Critical care time was exclusive of separately billable procedures and treating other patients.  Critical care was necessary to treat or prevent imminent or life-threatening deterioration.  Critical care was time spent personally by me on the following activities: development of treatment plan with patient and/or surrogate as well as nursing, discussions with consultants, evaluation of patient's response to treatment, examination of patient, obtaining history from patient or surrogate, ordering and performing treatments and interventions, ordering and review of laboratory studies, ordering and review of radiographic studies, pulse oximetry and re-evaluation of patient's condition.     For questions or updates, please contact [place] Please consult [email or web address] for contact info under        Signed, Sanda Klein, MD  05/17/2018, 8:40 AM

## 2018-05-18 ENCOUNTER — Inpatient Hospital Stay (HOSPITAL_COMMUNITY): Payer: BC Managed Care – PPO

## 2018-05-18 LAB — POCT I-STAT 3, ART BLOOD GAS (G3+)
Acid-Base Excess: 2 mmol/L (ref 0.0–2.0)
Acid-Base Excess: 2 mmol/L (ref 0.0–2.0)
Bicarbonate: 24.3 mmol/L (ref 20.0–28.0)
Bicarbonate: 25.6 mmol/L (ref 20.0–28.0)
O2 Saturation: 100 %
O2 Saturation: 99 %
Patient temperature: 37.6
Patient temperature: 37.7
TCO2: 25 mmol/L (ref 22–32)
TCO2: 27 mmol/L (ref 22–32)
pCO2 arterial: 28 mmHg — ABNORMAL LOW (ref 32.0–48.0)
pCO2 arterial: 35.8 mmHg (ref 32.0–48.0)
pH, Arterial: 7.549 — ABNORMAL HIGH (ref 7.350–7.450)
pH, Arterial: 7.466 — ABNORMAL HIGH (ref 7.350–7.450)
pO2, Arterial: 164 mmHg — ABNORMAL HIGH (ref 83.0–108.0)
pO2, Arterial: 154 mmHg — ABNORMAL HIGH (ref 83.0–108.0)

## 2018-05-18 LAB — CBC
HCT: 26.3 % — ABNORMAL LOW (ref 36.0–46.0)
Hemoglobin: 9 g/dL — ABNORMAL LOW (ref 12.0–15.0)
MCH: 29.7 pg (ref 26.0–34.0)
MCHC: 34.2 g/dL (ref 30.0–36.0)
MCV: 86.8 fL (ref 80.0–100.0)
Platelets: 114 10*3/uL — ABNORMAL LOW (ref 150–400)
RBC: 3.03 MIL/uL — ABNORMAL LOW (ref 3.87–5.11)
RDW: 17.7 % — ABNORMAL HIGH (ref 11.5–15.5)
WBC: 7.2 10*3/uL (ref 4.0–10.5)
nRBC: 0 % (ref 0.0–0.2)

## 2018-05-18 LAB — GLUCOSE, CAPILLARY
Glucose-Capillary: 140 mg/dL — ABNORMAL HIGH (ref 70–99)
Glucose-Capillary: 163 mg/dL — ABNORMAL HIGH (ref 70–99)
Glucose-Capillary: 171 mg/dL — ABNORMAL HIGH (ref 70–99)
Glucose-Capillary: 179 mg/dL — ABNORMAL HIGH (ref 70–99)
Glucose-Capillary: 181 mg/dL — ABNORMAL HIGH (ref 70–99)
Glucose-Capillary: 84 mg/dL (ref 70–99)
Glucose-Capillary: 97 mg/dL (ref 70–99)

## 2018-05-18 LAB — COMPREHENSIVE METABOLIC PANEL
ALT: 101 U/L — ABNORMAL HIGH (ref 0–44)
AST: 143 U/L — ABNORMAL HIGH (ref 15–41)
Albumin: 2.2 g/dL — ABNORMAL LOW (ref 3.5–5.0)
Alkaline Phosphatase: 128 U/L — ABNORMAL HIGH (ref 38–126)
Anion gap: 7 (ref 5–15)
BUN: 50 mg/dL — ABNORMAL HIGH (ref 8–23)
CO2: 25 mmol/L (ref 22–32)
Calcium: 9.2 mg/dL (ref 8.9–10.3)
Chloride: 106 mmol/L (ref 98–111)
Creatinine, Ser: 1.05 mg/dL — ABNORMAL HIGH (ref 0.44–1.00)
GFR calc Af Amer: >60 mL/min (ref >60)
GFR calc non Af Amer: 55 mL/min — ABNORMAL LOW (ref >60)
Glucose, Bld: 191 mg/dL — ABNORMAL HIGH (ref 70–99)
Potassium: 3.9 mmol/L (ref 3.5–5.1)
Sodium: 138 mmol/L (ref 135–145)
Total Bilirubin: 7.3 mg/dL — ABNORMAL HIGH (ref 0.3–1.2)
Total Protein: 5.9 g/dL — ABNORMAL LOW (ref 6.5–8.1)

## 2018-05-18 LAB — RENAL FUNCTION PANEL
Albumin: 2.1 g/dL — ABNORMAL LOW (ref 3.5–5.0)
Anion gap: 10 (ref 5–15)
BUN: 55 mg/dL — ABNORMAL HIGH (ref 8–23)
CO2: 26 mmol/L (ref 22–32)
Calcium: 8.6 mg/dL — ABNORMAL LOW (ref 8.9–10.3)
Chloride: 102 mmol/L (ref 98–111)
Creatinine, Ser: 1.09 mg/dL — ABNORMAL HIGH (ref 0.44–1.00)
GFR calc Af Amer: >60 mL/min (ref >60)
GFR calc non Af Amer: 53 mL/min — ABNORMAL LOW (ref >60)
Glucose, Bld: 124 mg/dL — ABNORMAL HIGH (ref 70–99)
Phosphorus: 6.8 mg/dL — ABNORMAL HIGH (ref 2.5–4.6)
Potassium: 3.7 mmol/L (ref 3.5–5.1)
Sodium: 138 mmol/L (ref 135–145)

## 2018-05-18 LAB — APTT: aPTT: 33 seconds (ref 24–36)

## 2018-05-18 LAB — PHOSPHORUS: Phosphorus: 5.9 mg/dL — ABNORMAL HIGH (ref 2.5–4.6)

## 2018-05-18 LAB — MAGNESIUM: Magnesium: 1.6 mg/dL — ABNORMAL LOW (ref 1.7–2.4)

## 2018-05-18 MED ORDER — GERHARDT'S BUTT CREAM
TOPICAL_CREAM | Freq: Two times a day (BID) | CUTANEOUS | Status: DC
  Administered 2018-05-18 – 2018-05-22 (×7): via TOPICAL
  Administered 2018-05-22: 1 via TOPICAL
  Administered 2018-05-23: 14:00:00 via TOPICAL
  Administered 2018-05-23 – 2018-05-24 (×2): 1 via TOPICAL
  Administered 2018-05-24 – 2018-05-26 (×4): via TOPICAL
  Filled 2018-05-18 (×2): qty 1

## 2018-05-18 MED ORDER — SODIUM CHLORIDE 0.9% FLUSH: 10.0000 mL | INTRAVENOUS | Status: DC | PRN

## 2018-05-18 MED ORDER — METOPROLOL TARTRATE 5 MG/5ML IV SOLN: 2.5000 mg | INTRAVENOUS | Status: DC | PRN

## 2018-05-18 MED ORDER — METOPROLOL TARTRATE 5 MG/5ML IV SOLN
5.0000 mg | Freq: Once | INTRAVENOUS | Status: DC
  Administered 2018-05-18: 5 mg via INTRAVENOUS

## 2018-05-18 MED ORDER — SODIUM CHLORIDE 0.9% FLUSH: 3.0000 mL | INTRAVENOUS | Status: DC | PRN

## 2018-05-18 MED ORDER — AMIODARONE HCL IN DEXTROSE 360-4.14 MG/200ML-% IV SOLN
60.0000 mg/h | INTRAVENOUS | Status: AC
  Administered 2018-05-18 (×2): 60 mg/h via INTRAVENOUS
  Filled 2018-05-18 (×2): qty 200

## 2018-05-18 MED ORDER — ORAL CARE MOUTH RINSE
15.0000 mL | Freq: Two times a day (BID) | OROMUCOSAL | Status: DC
  Administered 2018-05-18 – 2018-05-26 (×15): 15 mL via OROMUCOSAL

## 2018-05-18 MED ORDER — LEVALBUTEROL HCL 0.63 MG/3ML IN NEBU
INHALATION_SOLUTION | RESPIRATORY_TRACT | Status: AC
  Filled 2018-05-18: qty 3

## 2018-05-18 MED ORDER — METOPROLOL TARTRATE 5 MG/5ML IV SOLN
INTRAVENOUS | Status: AC
  Administered 2018-05-18: 5 mg via INTRAVENOUS
  Filled 2018-05-18: qty 5

## 2018-05-18 MED ORDER — HEPARIN SOD (PORK) LOCK FLUSH 100 UNIT/ML IV SOLN: 500.0000 [IU] | Freq: Every day | INTRAVENOUS | Status: DC | PRN

## 2018-05-18 MED ORDER — HYDRALAZINE HCL 20 MG/ML IJ SOLN
10.0000 mg | INTRAMUSCULAR | Status: DC | PRN
  Administered 2018-05-18 (×2): 10 mg via INTRAVENOUS
  Filled 2018-05-18 (×2): qty 1

## 2018-05-18 MED ORDER — LEVALBUTEROL HCL 0.63 MG/3ML IN NEBU
0.6300 mg | INHALATION_SOLUTION | RESPIRATORY_TRACT | Status: DC | PRN
  Administered 2018-05-18: 0.63 mg via RESPIRATORY_TRACT

## 2018-05-18 MED ORDER — HEPARIN SOD (PORK) LOCK FLUSH 100 UNIT/ML IV SOLN: 250.0000 [IU] | INTRAVENOUS | Status: DC | PRN

## 2018-05-18 MED ORDER — AMIODARONE LOAD VIA INFUSION
150.0000 mg | Freq: Once | INTRAVENOUS | Status: AC
  Administered 2018-05-18: 150 mg via INTRAVENOUS
  Filled 2018-05-18: qty 83.34

## 2018-05-18 MED ORDER — METOPROLOL TARTRATE 25 MG PO TABS: 25.0000 mg | ORAL_TABLET | Freq: Two times a day (BID) | ORAL | Status: DC

## 2018-05-18 MED ORDER — METOPROLOL TARTRATE 5 MG/5ML IV SOLN
5.0000 mg | Freq: Once | INTRAVENOUS | Status: AC
  Administered 2018-05-18: 5 mg via INTRAVENOUS

## 2018-05-18 MED ORDER — HEPARIN BOLUS VIA INFUSION
4000.0000 [IU] | Freq: Once | INTRAVENOUS | Status: AC
  Administered 2018-05-18: 4000 [IU] via INTRAVENOUS
  Filled 2018-05-18: qty 4000

## 2018-05-18 MED ORDER — HEPARIN (PORCINE) IN NACL 100-0.45 UNIT/ML-% IJ SOLN
1400.0000 [IU]/h | INTRAMUSCULAR | Status: DC
  Administered 2018-05-18 – 2018-05-19 (×2): 1400 [IU]/h via INTRAVENOUS
  Filled 2018-05-18 (×3): qty 250

## 2018-05-18 MED ORDER — AMIODARONE HCL IN DEXTROSE 360-4.14 MG/200ML-% IV SOLN
30.0000 mg/h | INTRAVENOUS | Status: DC
  Administered 2018-05-19 – 2018-05-24 (×10): 30 mg/h via INTRAVENOUS
  Filled 2018-05-18 (×12): qty 200

## 2018-05-18 NOTE — Progress Notes (Signed)
ANTICOAGULATION CONSULT NOTE - Initial Consult  Pharmacy Consult for Heparin Indication: atrial fibrillation  Allergies  Allergen Reactions  . Ancef [Cefazolin] Itching    Severe itching- after procedure, ancef was the antibiotic.-04/02/17  . Sulfa Antibiotics Rash and Other (See Comments)    Blisters, also    Patient Measurements: Height: 5\' 4"  (162.6 cm) Weight: 206 lb 2.1 oz (93.5 kg) IBW/kg (Calculated) : 54.7  Vital Signs: Temp: 98.5 F (36.9 C) (11/04 1121) Temp Source: Oral (11/04 1121) BP: 126/57 (11/04 1345) Pulse Rate: 140 (11/04 1121)  Labs: Recent Labs    05/16/18 0331 05/16/18 1200  05/17/18 0345 05/17/18 1622 05/18/18 0418  HGB 8.6* 7.4*  --  8.2*  --  9.0*  HCT 26.3* 23.4*  --  24.5*  --  26.3*  PLT 109* 100*  --  98*  --  114*  APTT 176*  --   --  35  --  33  LABPROT  --   --   --  15.1  --   --   INR  --   --   --  1.20  --   --   CREATININE 0.83  --    < > 1.03* 1.10* 1.05*   < > = values in this interval not displayed.    Estimated Creatinine Clearance: 60.8 mL/min (A) (by C-G formula based on SCr of 1.05 mg/dL (H)).   Medical History: Past Medical History:  Diagnosis Date  . Cancer ([place])   . Diabetes mellitus without complication ([place])   . Hypertension   . Macrocytic anemia     Assessment: 62yo female with hx severe AS, severe TR, severe pulmonary HTN, stage 3 colon cancer and myelodysplastic syndrome and anemia requiring frequent transfusions presented 10/28 with dyspnea found to have AFib with RVR, acute on chronic anemia and AKI. Pharmacy consulted to start heparin for atrial fibrillation.  Goal of Therapy:  Heparin level 0.3-0.7 units/ml Monitor platelets by anticoagulation protocol: Yes   Plan:  Give 4000 units bolus x 1 Start heparin infusion at 1400 units/hr Check anti-Xa level in 6 hours and daily while on heparin Continue to monitor H&H and platelets  Alanda Slim, PharmD, [facility] Clinical Pharmacist Please see AMION for  all Pharmacists' Contact Phone Numbers 05/18/2018, 2:06 PM

## 2018-05-18 NOTE — Procedures (Signed)
Extubation Procedure Note  Patient Details:   Name: Patricia Mata DOB: [birth year] MRN: [id number]   Airway Documentation:    Vent end date: 05/18/18 Vent end time: 0852   Evaluation  O2 sats: stable throughout Complications: No apparent complications Patient did tolerate procedure well. Bilateral Breath Sounds: Clear, Diminished   Yes   Patient extubated to nasal cannula per MD order.  Positive cuff leak was noted.  No evidence of stridor.  Patient able to speak post extubation.  Incentive spirometry performed x5 with achieved goal of 1500.  Patient tolerated well.  No complications noted.    Philomena Doheny 05/18/2018, 8:58 AM

## 2018-05-18 NOTE — Plan of Care (Signed)
  Problem: Health Behavior/Discharge Planning: Goal: Ability to manage health-related needs will improve Outcome: Progressing   Problem: Clinical Measurements: Goal: Ability to maintain clinical measurements within normal limits will improve Outcome: Progressing Goal: Will remain free from infection Outcome: Progressing Goal: Diagnostic test results will improve Outcome: Progressing Goal: Respiratory complications will improve Outcome: Progressing Goal: Cardiovascular complication will be avoided Outcome: Progressing   Problem: Activity: Goal: Risk for activity intolerance will decrease Outcome: Progressing   Problem: Nutrition: Goal: Adequate nutrition will be maintained Outcome: Progressing   Problem: Coping: Goal: Level of anxiety will decrease Outcome: Progressing   Problem: Elimination: Goal: Will not experience complications related to bowel motility Outcome: Progressing Goal: Will not experience complications related to urinary retention Outcome: Progressing   Problem: Pain Managment: Goal: General experience of comfort will improve Outcome: Progressing   Problem: Safety: Goal: Ability to remain free from injury will improve Outcome: Progressing   Problem: Skin Integrity: Goal: Risk for impaired skin integrity will decrease Outcome: Progressing   Problem: Health Behavior/Discharge Planning: Goal: Ability to manage health-related needs will improve Outcome: Progressing   Problem: Clinical Measurements: Goal: Complications related to the disease process or treatment will be avoided or minimized Outcome: Progressing Goal: Dialysis access will remain free of complications Outcome: Progressing   Problem: Activity: Goal: Activity intolerance will improve Outcome: Progressing   Problem: Fluid Volume: Goal: Fluid volume balance will be maintained or improved Outcome: Progressing   Problem: Respiratory: Goal: Respiratory symptoms related to disease  process will be avoided Outcome: Progressing   Problem: Self-Concept: Goal: Body image disturbance will be avoided or minimized Outcome: Progressing   Problem: Urinary Elimination: Goal: Progression of disease will be identified and treated Outcome: Progressing

## 2018-05-18 NOTE — Progress Notes (Addendum)
Progress Note  Patient Name: Patricia Mata Date of Encounter: 05/18/2018  Primary Cardiologist: Quay Burow, MD   Subjective   Patient feels ok.  Prefers to have her head up, rather than down for breathing.  Inpatient Medications    Scheduled Meds: . sodium chloride   Intravenous Once  . sodium chloride   Intravenous Once  . sodium chloride   Intravenous Once  . chlorhexidine gluconate (MEDLINE KIT)  15 mL Mouth Rinse BID  . Chlorhexidine Gluconate Cloth  6 each Topical Daily  . insulin aspart  0-20 Units Subcutaneous Q4H  . insulin aspart  6 Units Subcutaneous Q6H  . insulin glargine  20 Units Subcutaneous Daily  . latanoprost  1 drop Both Eyes QHS  . mouth rinse  15 mL Mouth Rinse 10 times per day  . pantoprazole sodium  40 mg Per Tube Daily  . sodium chloride flush  10-40 mL Intracatheter Q12H   Continuous Infusions: . sodium chloride 10 mL/hr at 05/18/18 0800  . norepinephrine (LEVOPHED) Adult infusion Stopped (05/16/18 2004)  . sodium chloride    . sodium chloride     PRN Meds: sodium chloride, heparin, heparin, hydrALAZINE, sodium chloride, sodium chloride, sodium chloride flush   Vital Signs    Vitals:   05/18/18 0800 05/18/18 0852 05/18/18 0857 05/18/18 0900  BP:      Pulse:   (!) 105   Resp: '10 11 17 12  ' Temp: 99.9 F (37.7 C)     TempSrc: Esophageal     SpO2:  100% 100%   Weight:      Height:        Intake/Output Summary (Last 24 hours) at 05/18/2018 1012 Last data filed at 05/18/2018 0900 Gross per 24 hour  Intake 1401.77 ml  Output 4420 ml  Net -3018.23 ml   Filed Weights   05/16/18 0353 05/17/18 0500 05/18/18 0454  Weight: 95.3 kg 96.2 kg 93.5 kg    Telemetry     NSR - Personally Reviewed  ECG    NSR, no ST segment changes - Personally Reviewed  Physical Exam   GEN: No acute distress. She responds slowly to questions; drowsy Left eye redness  Neck: No JVD Cardiac: RRR, no murmurs, rubs, or gallops.  Respiratory: Clear to  auscultation bilaterally. GI: Soft, nontender, non-distended  MS: Mild LE edema; No deformity. Neuro:  Nonfocal  Psych: flat affect   Labs    Chemistry Recent Labs  Lab 05/16/18 1212  05/17/18 0345 05/17/18 1622 05/18/18 0418  NA  --    < > 136 137 138  K  --    < > 4.0 4.0 3.9  CL  --    < > 105 105 106  CO2  --    < > '26 25 25  ' GLUCOSE  --    < > 154* 156* 191*  BUN  --    < > 35* 43* 50*  CREATININE  --    < > 1.03* 1.10* 1.05*  CALCIUM  --    < > 8.7* 9.2 9.2  PROT 5.6*  --  5.4*  --  5.9*  ALBUMIN 2.2*   < > 2.1* 2.3* 2.2*  AST 249*  --  192*  --  143*  ALT 151*  --  120*  --  101*  ALKPHOS 105  --  108  --  128*  BILITOT 5.7*  --  5.8*  --  7.3*  GFRNONAA  --    < >  57* 52* 55*  GFRAA  --    < > >60 >60 >60  ANIONGAP  --    < > '5 7 7   ' < > = values in this interval not displayed.     Hematology Recent Labs  Lab 05/16/18 1200 05/17/18 0345 05/18/18 0418  WBC 5.9 6.0 7.2  RBC 2.59* 2.79* 3.03*  HGB 7.4* 8.2* 9.0*  HCT 23.4* 24.5* 26.3*  MCV 90.3 87.8 86.8  MCH 28.6 29.4 29.7  MCHC 31.6 33.5 34.2  RDW 17.5* 17.6* 17.7*  PLT 100* 98* 114*    Cardiac Enzymes Recent Labs  Lab 05/11/18 2215 05/14/18 0854 05/14/18 1431 05/14/18 2037  TROPONINI 0.27* 0.15* 0.15* 0.13*   No results for input(s): TROPIPOC in the last 168 hours.   BNP Recent Labs  Lab 05/13/18 0333  BNP 3,571.3*     DDimer No results for input(s): DDIMER in the last 168 hours.   Radiology    Dg Chest Port 1 View  Result Date: 05/18/2018 CLINICAL DATA:  Respiratory failure.  History of endotracheal tube. EXAM: PORTABLE CHEST 1 VIEW COMPARISON:  05/17/2018 FINDINGS: Endotracheal tube is 3.3 cm above the carina. Nasogastric tube extends into the abdomen. There are 2 right jugular central lines with the tips near the junction of the right innominate vein and SVC. Left jugular dialysis catheter tip is in the central left innominate vein. Slightly prominent central vascular structures  are unchanged. Subtle densities at the lung bases, right side greater than left. No large areas of lung consolidation. Heart size is within normal limits and stable. Atherosclerotic calcifications at the aortic arch. IMPRESSION: 1. Stable haziness in the perihilar regions and lung bases. Findings are nonspecific but may be related to vascular congestion or mild edema. 2. Stable support apparatuses. Left jugular dialysis catheter remains in the left innominate vein which is not ideal for catheter flow. Electronically Signed   By: Markus Daft M.D.   On: 05/18/2018 07:53   Dg Chest Port 1 View  Result Date: 05/17/2018 CLINICAL DATA:  Respiratory failure. EXAM: PORTABLE CHEST 1 VIEW COMPARISON:  05/16/2018 FINDINGS: Stable support apparatus. Stably enlarged cardiac silhouette. Persistent mild peribronchial airspace opacities. Osseous structures are without acute abnormality. Soft tissues are grossly normal. IMPRESSION: Stable support apparatus. Mild peribronchial airspace opacities may represent atelectasis or airspace consolidation. Electronically Signed   By: Fidela Salisbury M.D.   On: 05/17/2018 07:48    Cardiac Studies     Patient Profile     62 y.o. female with AS and pulm HTN  Assessment & Plan    Will need R/L HC at some point.  WOuld like her to be more alert and able to lie flat.  Pulmonary HTN seems out of proprtion to the degree of AS.    Respiratory status is stable.    Discussed plan with family at the bedside.      For questions or updates, please contact [place] Please consult [email or web address] for contact info under        Signed, Larae Grooms, MD  05/18/2018, 10:12 AM    Addendum:  Patient with AFib with RVR later in the morning.  Rates up to 200.  Did not toerate well.  WIll start IV Amio for rate/rhythm control and add back IV heparin.  Low Hbg from myelodysplasia rather tahn bleeding.  D/w Dr. Elsworth Soho.  Jettie Booze, MD

## 2018-05-18 NOTE — Progress Notes (Signed)
ABG obtained on patient on ventilator settings of CPAP/PSV of 5/5 and FIO2 of 30%.  No further changes at this time.     Ref. Range 05/18/2018 07:54  Sample type Unknown ARTERIAL  pH, Arterial Latest Ref Range: 7.350 - 7.450  7.466 (H)  pCO2 arterial Latest Ref Range: 32.0 - 48.0 mmHg 35.8  pO2, Arterial Latest Ref Range: 83.0 - 108.0 mmHg 154.0 (H)  TCO2 Latest Ref Range: 22 - 32 mmol/L 27  Acid-Base Excess Latest Ref Range: 0.0 - 2.0 mmol/L 2.0  Bicarbonate Latest Ref Range: 20.0 - 28.0 mmol/L 25.6  O2 Saturation Latest Units: % 99.0  Patient temperature Unknown 37.7 C  Collection site Unknown ARTERIAL LINE

## 2018-05-18 NOTE — Progress Notes (Signed)
NAME:  Patricia Mata, MRN:  [id number], DOB:  1955/12/29, LOS: 7 ADMISSION DATE:  05/11/2018, CONSULTATION DATE:  10/28 REFERRING MD:  EDP, CHIEF COMPLAINT:  AFib RVR    Brief History   62yo female with hx severe AS, severe TR, severe pulmonary HTN, stage 3 colon cancer and myelodysplastic syndrome and anemia requiring frequent transfusions presented 10/28 with dyspnea found to have AFib with RVR, acute on chronic anemia and AKI.  Cardiology requesting ICU admission per PCCM.  She developed severe metabolic acidosis and shock requiring mechanical ventilation  Past Medical History  Severe AS  Severe TR Severe pulmonary hypertension Stage III colon cancer Myelodysplastic syndrome-diagnosed 2017 Chronic microcytic anemia Hypertension Diabetes Significant Hospital Events   10/29 early am  Intubated emergently>> suspected sepsis>>severe  metabolic acidosis 00/17 CRRT initiated  Consults: date of consult/date signed off & final recs:  Cardiology 10/28  Procedures (surgical and bedside):  10/29>> ETT>> 11/4 10/29>> RIJ CVC>> 10/29>> A Line 10/29>> LIJ HD cath >> 11/4  Significant Diagnostic Tests:  10/25 CT abdomen and pelvis Heterogeneous hepatic parenchyma  ? Due to  hepatic venous congestion. Small amount of perihepatic ascites and free fluid in the pelvis.    Micro Data:  10/29>> Blood ng 10/31>> Urine>> No growth 10/29>> Sputum >>ng  Antimicrobials:  10/29 Merrem >>11/1 10/29 Vanc >> 10/31  Subjective:   She remains orally intubated, on low-dose sedation Afebrile last 24 hours Urine output has improved  Objective   Blood pressure (!) 151/47, pulse (!) 105, temperature 99.9 F (37.7 C), temperature source Esophageal, resp. rate 12, height 5\' 4"  (1.626 m), weight 93.5 kg, SpO2 100 %.    Vent Mode: PSV;CPAP FiO2 (%):  [30 %] 30 % Set Rate:  [20 bmp] 20 bmp Vt Set:  [400 mL] 400 mL PEEP:  [5 cmH20] 5 cmH20 Pressure Support:  [5 cmH20] 5 cmH20 Plateau Pressure:   [13 cmH20-18 cmH20] 13 cmH20   Intake/Output Summary (Last 24 hours) at 05/18/2018 1021 Last data filed at 05/18/2018 0900 Gross per 24 hour  Intake 1401.77 ml  Output 4420 ml  Net -3018.23 ml   Filed Weights   05/16/18 0353 05/17/18 0500 05/18/18 0454  Weight: 95.3 kg 96.2 kg 93.5 kg    Acutely ill-appearing, awake Orally intubated No respiratory distress, weaning on pressure support, decreased breath sounds bilateral, no rhonchi Ejection systolic murmur 2/6 at base, no thrill, S1-S2 regular Soft nontender abdomen Awake and follows commands No edema  Resolved Hospital Problem list     Assessment & Plan:  Cardiogenic shock long QTc Plan: Levophed off, goal SBP 100 & above avoid QT prolonging drugs   A. fib with RVR>> rate now controlled and in Sinus Rhythm Plan- If recurs, consider amio Not  on IV heparin  Can restart home metoprolol   Severe aortic stenosis Severe TR Severe pulmonary hypertension - PA peak pressure >190mmHg Hx HTN Plan- Cardiology following Reassess for AVRWill need R and L heart cath after resolution of critical illness    Acute Respiratory Failure Post Arrest Plan: SBT -she tolerated pressure support 5/5 with good tidal volumes and was extubated to nasal cannula   Doubt sepsis, cultures neg Dc meropenem  AKI  -resolving Severe metabolic acidosis>> correcting  Plan Good urine output now, discontinue HD cath  Acute on chronic anemia s/p 5u PRBC so far, last 11/2 Myelodysplastic syndrome Thrombocytopenia>> improving Bleeding per rectum has stopped,  Oozing from CVC site has stopped  Plan- Goal hemoglobin greater than 8 Follow-up  CBC daily   Shock Liver LFT's are down trending Plan: Trend LFT's intermittently  Avoid hepato-toxic medications No Tylenol   Hypomagnesemia Hyperkalemia Severe hypo-phos Plan- repleted   DM-2, uncontrolled  Plan- SSI ct lantus to 20 U daily   Disposition / Summary of Today's Plan  05/18/18   She weaned well and was extubated seems to be tolerating well Renal function is improved and HD catheter will be discontinued. Cardiology to assess for valve replacement, will need T CTS input at some point.      Code Status: Full code Family Communication: No family at bedside  The patient is critically ill with multiple organ systems failure and requires high complexity decision making for assessment and support, frequent evaluation and titration of therapies, application of advanced monitoring technologies and extensive interpretation of multiple databases. Critical Care Time devoted to patient care services described in this note independent of APP/resident  time is 31 minutes.   Kara Mead MD. Shade Flood. [facility] Pager [phone] If no response call [phone]     05/18/2018 10:21 AM

## 2018-05-18 NOTE — Progress Notes (Signed)
[facility] NEPHROLOGY PROGRESS NOTE  Assessment/ Plan: Pt is a 62 y.o. yo female  with history of stage III colon cancer,MDS dx 2017,chronic macrocytic anemia requiring frequent transfusions, DM, HTN, severeAS, severe TR, severe pulmonary hypertension with recent echocardiogram showing RVSP 112 mmHghere withprogressive dyspnea found to have AKI andA. fib with RVR w/ AKI;cr risenfrom 1.05 to 3.37. Eventually intubated forsepsis + metabolic acidosis with a pH of 6.93 + hyperkalemia (K5.4). She also had hypotension req Levophed w/ CXR showing either edema vs PNA  # Acute Kidney Injury due to sepsis, hypoperfusion causing ATN: Patient required CRRT for anuria, off CRRT on 11/2.  Patient is currently off pressors.  She has significant urine output, more than 4 L in 24 hours.  Serum creatinine level is stable. Patient has renal recovery.  I recommend to discontinue temporary dialysis catheter.  I will sign off at this time, call us with question.  I discussed with primary team.  # Afib RVR - rate controlled, monitor heart rate.  # Severe AS and pulm HTN -per cardiology.   #Respiratory failure on mechanical ventilation: Likely extubation today, patient is alert awake and following commands.  # h/o MDS and colon CA # DM  Subjective: Seen and examined at bedside.  Currently on trial to extubate.  Following commands.  She denies chest pain, shortness of breath.  Review of systems limited. Objective Vital signs in last 24 hours: Vitals:   05/18/18 0630 05/18/18 0700 05/18/18 0730 05/18/18 0745  BP:    (!) 151/47  Pulse:    93  Resp: _0 Temp: 99.7 F (37.6 C) 99.5 F (37.5 C) 99.7 F (37.6 C) 99.9 F (37.7 C)  TempSrc:  Esophageal    SpO2:  99%  100%  Weight:      Height:       Weight change: -2.7 kg  Intake/Output Summary (Last 24 hours) at 05/18/2018 0841 Last data filed at 05/18/2018 0800 Gross per 24 hour  Intake 1501.77 ml  Output 4360 ml  Net  -2858.23 ml       Labs: Basic Metabolic Panel: Recent Labs  Lab 05/17/18 0345 05/17/18 1622 05/18/18 0418  NA 136 137 138  K 4.0 4.0 3.9  CL 105 105 106  CO2 _1 GLUCOSE 154* 156* 191*  BUN 35* 43* 50*  CREATININE 1.03* 1.10* 1.05*  CALCIUM 8.7* 9.2 9.2  PHOS 4.1 5.2* 5.9*   Liver Function Tests: Recent Labs  Lab 05/16/18 1212  05/17/18 0345 05/17/18 1622 05/18/18 0418  AST 249*  --  192*  --  143*  ALT 151*  --  120*  --  101*  ALKPHOS 105  --  108  --  128*  BILITOT 5.7*  --  5.8*  --  7.3*  PROT 5.6*  --  5.4*  --  5.9*  ALBUMIN 2.2*   < > 2.1* 2.3* 2.2*   < > = values in this interval not displayed.   No results for input(s): LIPASE, AMYLASE in the last 168 hours. No results for input(s): AMMONIA in the last 168 hours. CBC: Recent Labs  Lab 05/15/18 0427 05/16/18 0331 05/16/18 1200 05/17/18 0345 05/18/18 0418  WBC 4.7 5.9 5.9 6.0 7.2  HGB 8.4* 8.6* 7.4* 8.2* 9.0*  HCT 24.4* 26.3* 23.4* 24.5* 26.3*  MCV 86.8 89.2 90.3 87.8 86.8  PLT 109* 109* 100* 98* 114*   Cardiac Enzymes: Recent Labs  Lab 05/11/18 1538 05/11/18 2215 05/14/18  9163 05/14/18 1431 05/14/18 2037  TROPONINI 0.28* 0.27* 0.15* 0.15* 0.13*   CBG: Recent Labs  Lab 05/17/18 1244 05/17/18 1611 05/17/18 2019 05/17/18 2329 05/18/18 0420  GLUCAP 134* 121* 140* 181* 179*    Iron Studies: No results for input(s): IRON, TIBC, TRANSFERRIN, FERRITIN in the last 72 hours. Studies/Results: Dg Chest Port 1 View  Result Date: 05/18/2018 CLINICAL DATA:  Respiratory failure.  History of endotracheal tube. EXAM: PORTABLE CHEST 1 VIEW COMPARISON:  05/17/2018 FINDINGS: Endotracheal tube is 3.3 cm above the carina. Nasogastric tube extends into the abdomen. There are 2 right jugular central lines with the tips near the junction of the right innominate vein and SVC. Left jugular dialysis catheter tip is in the central left innominate vein. Slightly prominent central vascular structures are  unchanged. Subtle densities at the lung bases, right side greater than left. No large areas of lung consolidation. Heart size is within normal limits and stable. Atherosclerotic calcifications at the aortic arch. IMPRESSION: 1. Stable haziness in the perihilar regions and lung bases. Findings are nonspecific but may be related to vascular congestion or mild edema. 2. Stable support apparatuses. Left jugular dialysis catheter remains in the left innominate vein which is not ideal for catheter flow. Electronically Signed   By: Markus Daft M.D.   On: 05/18/2018 07:53   Dg Chest Port 1 View  Result Date: 05/17/2018 CLINICAL DATA:  Respiratory failure. EXAM: PORTABLE CHEST 1 VIEW COMPARISON:  05/16/2018 FINDINGS: Stable support apparatus. Stably enlarged cardiac silhouette. Persistent mild peribronchial airspace opacities. Osseous structures are without acute abnormality. Soft tissues are grossly normal. IMPRESSION: Stable support apparatus. Mild peribronchial airspace opacities may represent atelectasis or airspace consolidation. Electronically Signed   By: Fidela Salisbury M.D.   On: 05/17/2018 07:48    Medications: Infusions: . sodium chloride 10 mL/hr at 05/18/18 0800  . dexmedetomidine (PRECEDEX) IV infusion Stopped (05/18/18 0143)  . feeding supplement (VITAL HIGH PROTEIN) Stopped (05/18/18 0745)  . heparin 10,000 units/ 20 mL infusion syringe 1,150 Units/hr (05/16/18 0219)  . norepinephrine (LEVOPHED) Adult infusion Stopped (05/16/18 2004)  . prismasol BGK 4/2.5 500 mL/hr at 05/15/18 1950  . prismasol BGK 4/2.5 300 mL/hr at 05/14/18 2316  . prismasol BGK 4/2.5 1,500 mL/hr at 05/16/18 0243  . sodium chloride    . sodium chloride      Scheduled Medications: . sodium chloride   Intravenous Once  . sodium chloride   Intravenous Once  . sodium chloride   Intravenous Once  . chlorhexidine gluconate (MEDLINE KIT)  15 mL Mouth Rinse BID  . Chlorhexidine Gluconate Cloth  6 each Topical Daily  .  insulin aspart  0-20 Units Subcutaneous Q4H  . insulin aspart  6 Units Subcutaneous Q6H  . insulin glargine  20 Units Subcutaneous Daily  . latanoprost  1 drop Both Eyes QHS  . mouth rinse  15 mL Mouth Rinse 10 times per day  . pantoprazole sodium  40 mg Per Tube Daily  . sodium chloride flush  10-40 mL Intracatheter Q12H    have reviewed scheduled and prn medications.  Physical Exam: General: Intubated, following commands Heart:RRR, s1s2 nl Lungs: Coarse breath sound, no wheezing Abdomen:soft, Non-tender, non-distended Extremities:No edema Dialysis Access: Temporary catheter.  Gaven Eugene Prasad Xochilth Standish 05/18/2018,8:41 AM  LOS: 7 days

## 2018-05-18 NOTE — Progress Notes (Signed)
eLink Physician-Brief Progress Note Patient Name: Patricia Mata DOB: 30-Jul-1955 MRN: [id number]   Date of Service  05/18/2018  HPI/Events of Note  Hypotension - BP = 181/49.   eICU Interventions  Will order: 1. Hydralazine 10 mg IV Q 4 hours PRN SBP > 170.      Intervention Category Major Interventions: Hypertension - evaluation and management  Wyley Hack Eugene 05/18/2018, 4:17 AM

## 2018-05-18 NOTE — Progress Notes (Signed)
RT placed patient on SBT with PS 5, Peep 5. Patient is doing well at this time. RT will continue to monitor.

## 2018-05-18 NOTE — Significant Event (Addendum)
Patient's HR in the 120-150s, with burst of SVTs up to 230s every 2-4 minutes after getting out of bed to chair. Patient is symptomatic with increased wheezing and work of breathing; saturations 97%, on 2L . Dr. Elsworth Soho called and at bedside; 5mg  metoprolol X 2 given per MD's verbal orders. HR down to 100-110s now; wheezing subsided. Patient appears more at ease now. Remains sitting up in the chair; family (son) updated and is at bedside.    Patricia Mata

## 2018-05-18 NOTE — Progress Notes (Signed)
eLink Physician-Brief Progress Note Patient Name: Patricia Mata DOB: Nov 29, 1955 MRN: [id number]   Date of Service  05/18/2018  HPI/Events of Note  HR = 134 - Metoprolol PO ordered and nurse does not feel that patient can handle pills/tablets. Request to change to Metoprolol IV.   eICU Interventions  Will order: 1. D/C Metoprolol PO. 2. Metoprolol 2.5-5 mg IV Q 3 hours PRN HR > 115.     Intervention Category Major Interventions: Arrhythmia - evaluation and management  [name] 05/18/2018, 9:41 PM

## 2018-05-19 LAB — CBC WITH DIFFERENTIAL/PLATELET
Abs Immature Granulocytes: 0.1 10*3/uL — ABNORMAL HIGH (ref 0.00–0.07)
Basophils Absolute: 0 10*3/uL (ref 0.0–0.1)
Basophils Relative: 0 %
Eosinophils Absolute: 0.1 10*3/uL (ref 0.0–0.5)
Eosinophils Relative: 2 %
HCT: 25.4 % — ABNORMAL LOW (ref 36.0–46.0)
Hemoglobin: 8.3 g/dL — ABNORMAL LOW (ref 12.0–15.0)
Immature Granulocytes: 2 %
Lymphocytes Relative: 13 %
Lymphs Abs: 0.7 10*3/uL (ref 0.7–4.0)
MCH: 29.6 pg (ref 26.0–34.0)
MCHC: 32.7 g/dL (ref 30.0–36.0)
MCV: 90.7 fL (ref 80.0–100.0)
Monocytes Absolute: 1.1 10*3/uL — ABNORMAL HIGH (ref 0.1–1.0)
Monocytes Relative: 20 %
Neutro Abs: 3.5 10*3/uL (ref 1.7–7.7)
Neutrophils Relative %: 63 %
Platelets: 122 10*3/uL — ABNORMAL LOW (ref 150–400)
RBC: 2.8 MIL/uL — ABNORMAL LOW (ref 3.87–5.11)
RDW: 18.2 % — ABNORMAL HIGH (ref 11.5–15.5)
WBC: 5.5 10*3/uL (ref 4.0–10.5)
nRBC: 0 % (ref 0.0–0.2)

## 2018-05-19 LAB — RENAL FUNCTION PANEL
Albumin: 2.2 g/dL — ABNORMAL LOW (ref 3.5–5.0)
Albumin: 2.4 g/dL — ABNORMAL LOW (ref 3.5–5.0)
Anion gap: 11 (ref 5–15)
Anion gap: 10 (ref 5–15)
BUN: 53 mg/dL — ABNORMAL HIGH (ref 8–23)
BUN: 44 mg/dL — ABNORMAL HIGH (ref 8–23)
CO2: 25 mmol/L (ref 22–32)
CO2: 26 mmol/L (ref 22–32)
Calcium: 8.5 mg/dL — ABNORMAL LOW (ref 8.9–10.3)
Calcium: 9 mg/dL (ref 8.9–10.3)
Chloride: 104 mmol/L (ref 98–111)
Chloride: 104 mmol/L (ref 98–111)
Creatinine, Ser: 1.02 mg/dL — ABNORMAL HIGH (ref 0.44–1.00)
Creatinine, Ser: 0.94 mg/dL (ref 0.44–1.00)
GFR calc Af Amer: >60 mL/min (ref >60)
GFR calc Af Amer: >60 mL/min (ref >60)
GFR calc non Af Amer: 57 mL/min — ABNORMAL LOW (ref >60)
GFR calc non Af Amer: >60 mL/min (ref >60)
Glucose, Bld: 90 mg/dL (ref 70–99)
Glucose, Bld: 189 mg/dL — ABNORMAL HIGH (ref 70–99)
Phosphorus: 6 mg/dL — ABNORMAL HIGH (ref 2.5–4.6)
Phosphorus: 4.9 mg/dL — ABNORMAL HIGH (ref 2.5–4.6)
Potassium: 3.8 mmol/L (ref 3.5–5.1)
Potassium: 3.8 mmol/L (ref 3.5–5.1)
Sodium: 140 mmol/L (ref 135–145)
Sodium: 140 mmol/L (ref 135–145)

## 2018-05-19 LAB — GLUCOSE, CAPILLARY
Glucose-Capillary: 107 mg/dL — ABNORMAL HIGH (ref 70–99)
Glucose-Capillary: 129 mg/dL — ABNORMAL HIGH (ref 70–99)
Glucose-Capillary: 60 mg/dL — ABNORMAL LOW (ref 70–99)
Glucose-Capillary: 63 mg/dL — ABNORMAL LOW (ref 70–99)
Glucose-Capillary: 74 mg/dL (ref 70–99)
Glucose-Capillary: 79 mg/dL (ref 70–99)
Glucose-Capillary: 84 mg/dL (ref 70–99)

## 2018-05-19 LAB — HEPARIN LEVEL (UNFRACTIONATED)
Heparin Unfractionated: 0.4 IU/mL (ref 0.30–0.70)
Heparin Unfractionated: 0.49 IU/mL (ref 0.30–0.70)

## 2018-05-19 LAB — MAGNESIUM: Magnesium: 1.6 mg/dL — ABNORMAL LOW (ref 1.7–2.4)

## 2018-05-19 LAB — APTT: aPTT: 150 seconds — ABNORMAL HIGH (ref 24–36)

## 2018-05-19 MED ORDER — INSULIN GLARGINE 100 UNIT/ML ~~LOC~~ SOLN
10.0000 [IU] | Freq: Every day | SUBCUTANEOUS | Status: DC
  Administered 2018-05-20 – 2018-05-26 (×7): 10 [IU] via SUBCUTANEOUS
  Filled 2018-05-19 (×7): qty 0.1

## 2018-05-19 MED ORDER — MAGNESIUM SULFATE 2 GM/50ML IV SOLN
2.0000 g | Freq: Once | INTRAVENOUS | Status: AC
  Administered 2018-05-19: 2 g via INTRAVENOUS
  Filled 2018-05-19: qty 50

## 2018-05-19 MED ORDER — DEXTROSE 50 % IV SOLN
25.0000 mL | Freq: Once | INTRAVENOUS | Status: AC
  Administered 2018-05-19: 25 mL via INTRAVENOUS
  Filled 2018-05-19: qty 50

## 2018-05-19 NOTE — Progress Notes (Signed)
Progress Note  Patient Name: Patricia Mata Date of Encounter: 05/19/2018  Primary Cardiologist: Quay Burow, MD   Subjective   Feels better.  No chest pain.  Breathing is better.   Inpatient Medications    Scheduled Meds: . sodium chloride   Intravenous Once  . sodium chloride   Intravenous Once  . sodium chloride   Intravenous Once  . Chlorhexidine Gluconate Cloth  6 each Topical Daily  . Gerhardt's butt cream   Topical BID  . insulin aspart  0-20 Units Subcutaneous Q4H  . insulin glargine  20 Units Subcutaneous Daily  . latanoprost  1 drop Both Eyes QHS  . mouth rinse  15 mL Mouth Rinse BID  . pantoprazole sodium  40 mg Per Tube Daily  . sodium chloride flush  10-40 mL Intracatheter Q12H   Continuous Infusions: . sodium chloride 10 mL/hr at 05/18/18 0800  . amiodarone 30 mg/hr (05/19/18 0700)  . heparin 1,400 Units/hr (05/19/18 0700)  . magnesium sulfate 1 - 4 g bolus IVPB 2 g (05/19/18 1025)   PRN Meds: sodium chloride, hydrALAZINE, levalbuterol, metoprolol tartrate, sodium chloride flush   Vital Signs    Vitals:   05/19/18 0500 05/19/18 0600 05/19/18 0700 05/19/18 0749  BP: (!) 151/57 (!) 147/90 (!) 161/64   Pulse:      Resp: 13 15 18    Temp:    99 F (37.2 C)  TempSrc:    Oral  SpO2:      Weight:      Height:        Intake/Output Summary (Last 24 hours) at 05/19/2018 1046 Last data filed at 05/19/2018 0700 Gross per 24 hour  Intake 681.45 ml  Output 1325 ml  Net -643.55 ml   Filed Weights   05/17/18 0500 05/18/18 0454 05/19/18 0444  Weight: 96.2 kg 93.5 kg 87 kg    Telemetry    NSR - Personally Reviewed  ECG    NSR, PACs - Personally Reviewed  Physical Exam   GEN: No acute distress.   Neck: No JVD Cardiac: RRR, 2/6 systolic  murmur, no rubs, or gallops.  Respiratory: Clear to auscultation bilaterally. GI: Soft, nontender, non-distended  MS: Mild LE edema; No deformity. Neuro:  Nonfocal  Psych: Normal affect   Labs     Chemistry Recent Labs  Lab 05/16/18 1212  05/17/18 0345  05/18/18 0418 05/18/18 1739 05/19/18 0336  NA  --    < > 136   < > 138 138 140  K  --    < > 4.0   < > 3.9 3.7 3.8  CL  --    < > 105   < > 106 102 104  CO2  --    < > 26   < > 25 26 25   GLUCOSE  --    < > 154*   < > 191* 124* 90  BUN  --    < > 35*   < > 50* 55* 53*  CREATININE  --    < > 1.03*   < > 1.05* 1.09* 1.02*  CALCIUM  --    < > 8.7*   < > 9.2 8.6* 8.5*  PROT 5.6*  --  5.4*  --  5.9*  --   --   ALBUMIN 2.2*   < > 2.1*   < > 2.2* 2.1* 2.2*  AST 249*  --  192*  --  143*  --   --   ALT 151*  --  120*  --  101*  --   --   ALKPHOS 105  --  108  --  128*  --   --   BILITOT 5.7*  --  5.8*  --  7.3*  --   --   GFRNONAA  --    < > 57*   < > 55* 53* 57*  GFRAA  --    < > >60   < > >60 >60 >60  ANIONGAP  --    < > 5   < > 7 10 11    < > = values in this interval not displayed.     Hematology Recent Labs  Lab 05/17/18 0345 05/18/18 0418 05/19/18 0336  WBC 6.0 7.2 5.5  RBC 2.79* 3.03* 2.80*  HGB 8.2* 9.0* 8.3*  HCT 24.5* 26.3* 25.4*  MCV 87.8 86.8 90.7  MCH 29.4 29.7 29.6  MCHC 33.5 34.2 32.7  RDW 17.6* 17.7* 18.2*  PLT 98* 114* 122*    Cardiac Enzymes Recent Labs  Lab 05/14/18 0854 05/14/18 1431 05/14/18 2037  TROPONINI 0.15* 0.15* 0.13*   No results for input(s): TROPIPOC in the last 168 hours.   BNP Recent Labs  Lab 05/13/18 0333  BNP 3,571.3*     DDimer No results for input(s): DDIMER in the last 168 hours.   Radiology    Dg Chest Port 1 View  Result Date: 05/18/2018 CLINICAL DATA:  Respiratory failure.  History of endotracheal tube. EXAM: PORTABLE CHEST 1 VIEW COMPARISON:  05/17/2018 FINDINGS: Endotracheal tube is 3.3 cm above the carina. Nasogastric tube extends into the abdomen. There are 2 right jugular central lines with the tips near the junction of the right innominate vein and SVC. Left jugular dialysis catheter tip is in the central left innominate vein. Slightly prominent central  vascular structures are unchanged. Subtle densities at the lung bases, right side greater than left. No large areas of lung consolidation. Heart size is within normal limits and stable. Atherosclerotic calcifications at the aortic arch. IMPRESSION: 1. Stable haziness in the perihilar regions and lung bases. Findings are nonspecific but may be related to vascular congestion or mild edema. 2. Stable support apparatuses. Left jugular dialysis catheter remains in the left innominate vein which is not ideal for catheter flow. Electronically Signed   By: Markus Daft M.D.   On: 05/18/2018 07:53    Cardiac Studies   LVEF normal  Patient Profile     62 y.o. female with AFib, aortic stenosis, PAH  Assessment & Plan    1) AFib: rate controlled. IV heparin for stroke prevention.  2) Doing well from a breathing standpoint.  Biggest issue will be getting her up and more active. PT consult in place.    3) Anemia: stable.  Not from active bleeding.  Kidney function has improved.  She will need a left and right heart cath at some point.  WOuld not do this today but will see about getting the cath done this week.  She is agreeable.      For questions or updates, please contact [place] Please consult [email or web address] for contact info under        Signed, Larae Grooms, MD  05/19/2018, 10:46 AM

## 2018-05-19 NOTE — Evaluation (Signed)
Clinical/Bedside Swallow Evaluation Patient Details  Name: Patricia Mata MRN: [id number] Date of Birth: 12-22-1955  Today's Date: 05/19/2018 Time: SLP Start Time (ACUTE ONLY): 1412 SLP Stop Time (ACUTE ONLY): 1428 SLP Time Calculation (min) (ACUTE ONLY): 16 min  Past Medical History:  Past Medical History:  Diagnosis Date  . Cancer ([place])   . Diabetes mellitus without complication ([place])   . Hypertension   . Macrocytic anemia    Past Surgical History:  Past Surgical History:  Procedure Laterality Date  . COLON SURGERY    . IR FLUORO GUIDE PORT INSERTION RIGHT  04/02/2017  . IR US GUIDE VASC ACCESS RIGHT  04/02/2017   HPI:  62yo female with hx severe AS, severe TR, severe pulmonary HTN, stage 3 colon cancer and myelodysplastic syndrome and anemia requiring frequent transfusions presented 10/28 with dyspnea found to have AFib with RVR, acute on chronic anemia and AKI.  Cardiology requesting ICU admission per PCCM. She developed severe metabolic acidosis and shock requiring mechanical ventilation.  Pt was intubated for 6 days with extubation on 05/18/18 around 9 am.  Most recent CXR from 11/4 shows "Stable haziness in the perihilar regions and lung bases. Findings are nonspecific but may be related to vascular congestion or mild edema"   Assessment / Plan / Recommendation Clinical Impression  Pt presents with functional swallowing as assessed clinically.  Pt tolerated all consistencies trialed with no overt s/s of aspiration.  Pt did require 2 swallows per bolus with thin liquid, but was able to tolerate serial straw sips without difficulty. Pt exhibited good oral clearance of solids.  Pt's voice is weak and hoarse, likely 2/2 intubation, but has dramatically improved as pt was aphonic yesterday.  Recommend regular texture diet with thin liquid. SLP will follow to ensure tolerance. SLP Visit Diagnosis: Dysphagia, unspecified (R13.10)    Aspiration Risk  Mild aspiration risk    Diet  Recommendation Regular;Thin liquid   Liquid Administration via: Cup;Straw Medication Administration: Whole meds with liquid Supervision: Patient able to self feed Compensations: Slow rate;Small sips/bites Postural Changes: Seated upright at 90 degrees    Other  Recommendations Oral Care Recommendations: Oral care BID   Frequency and Duration min 2x/week  2 weeks        Swallow Study   General Date of Onset: 05/11/18 HPI: 62yo female with hx severe AS, severe TR, severe pulmonary HTN, stage 3 colon cancer and myelodysplastic syndrome and anemia requiring frequent transfusions presented 10/28 with dyspnea found to have AFib with RVR, acute on chronic anemia and AKI.  Cardiology requesting ICU admission per PCCM. She developed severe metabolic acidosis and shock requiring mechanical ventilation.  Pt was intubated for 6 days with extubation on 05/18/18 around 9 am.  Most recent CXR from 11/4 shows "Stable haziness in the perihilar regions and lung bases. Findings are nonspecific but may be related to vascular congestion or mild edema" Type of Study: Bedside Swallow Evaluation Previous Swallow Assessment: none Diet Prior to this Study: NPO Temperature Spikes Noted: No History of Recent Intubation: Yes Length of Intubations (days): 6 days Date extubated: 05/18/18 Behavior/Cognition: Alert;Cooperative;Pleasant mood Oral Cavity Assessment: Within Functional Limits Oral Care Completed by SLP: No Oral Cavity - Dentition: Missing dentition(pt does not wear dentures, reports no difficulty with mastication at baseline) Vision: Functional for self-feeding Self-Feeding Abilities: Able to feed self Patient Positioning: Upright in chair Baseline Vocal Quality: Hoarse Volitional Cough: Strong Volitional Swallow: Able to elicit    Oral/Motor/Sensory Function Overall Oral Motor/Sensory Function: Mild impairment  Facial ROM: Reduced left(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) Facial Symmetry:  Abnormal symmetry left(pt reports this is baseline) Facial Sensation: Within Functional Limits Lingual ROM: Within Functional Limits Lingual Strength: Within Functional Limits Velum: Within Functional Limits Mandible: Within Functional Limits   Ice Chips Ice chips: Not tested   Thin Liquid Thin Liquid: Impaired Presentation: Cup;Straw Pharyngeal  Phase Impairments: Multiple swallows    Nectar Thick Nectar Thick Liquid: Not tested   Honey Thick Honey Thick Liquid: Not tested   Puree Puree: Within functional limits Presentation: Spoon   Solid     Solid: Within functional limits Presentation: [place], [place], [place] Office: [phone], Pager [phone]): [phone] 05/19/2018,2:42 PM

## 2018-05-19 NOTE — Progress Notes (Signed)
ANTICOAGULATION CONSULT NOTE - Follow Up Consult  Pharmacy Consult for heparin Indication: atrial fibrillation  Labs: Recent Labs    05/16/18 0331 05/16/18 1200  05/17/18 0345 05/17/18 1622 05/18/18 0418 05/18/18 1739 05/18/18 2331  HGB 8.6* 7.4*  --  8.2*  --  9.0*  --   --   HCT 26.3* 23.4*  --  24.5*  --  26.3*  --   --   PLT 109* 100*  --  98*  --  114*  --   --   APTT 176*  --   --  35  --  33  --   --   LABPROT  --   --   --  15.1  --   --   --   --   INR  --   --   --  1.20  --   --   --   --   HEPARINUNFRC  --   --   --   --   --   --   --  0.40  CREATININE 0.83  --    < > 1.03* 1.10* 1.05* 1.09*  --    < > = values in this interval not displayed.    Assessment/Plan:  63yo female therapeutic on heparin with initial dosing for Afib. Will continue gtt at current rate and confirm stable with am labs.   Wynona Neat, PharmD, BCPS  05/19/2018,12:25 AM

## 2018-05-19 NOTE — Progress Notes (Signed)
NAME:  Patricia Mata, MRN:  [id number], DOB:  24-Jan-1956, LOS: 8 ADMISSION DATE:  05/11/2018, CONSULTATION DATE:  10/28 REFERRING MD:  EDP, CHIEF COMPLAINT:  AFib RVR    Brief History   62yo female with hx severe AS, severe TR, severe pulmonary HTN, stage 3 colon cancer and myelodysplastic syndrome and anemia requiring frequent transfusions presented 10/28 with dyspnea found to have AFib with RVR, acute on chronic anemia and AKI.  Cardiology requesting ICU admission per PCCM.  She developed severe metabolic acidosis and shock requiring mechanical ventilation  Past Medical History  Severe AS, Severe TR Severe pulmonary hypertension Stage III colon cancer Myelodysplastic syndrome-diagnosed 2017 Chronic microcytic anemia Hypertension Diabetes Significant Hospital Events   10/29 early am  Intubated emergently>> suspected sepsis>>severe  metabolic acidosis 69/48 CRRT initiated  Consults: date of consult/date signed off & final recs:  Cardiology 10/28  Procedures (surgical and bedside):  10/29>> ETT>> 11/4 10/29>> RIJ CVC  10/29>> A Line 10/29>> LIJ HD cath >> 11/4  Significant Diagnostic Tests:  10/25 CT abdomen and pelvis Heterogeneous hepatic parenchyma  ? Due to  hepatic venous congestion. Small amount of perihepatic ascites and free fluid in the pelvis.  Micro Data:  10/29>> Blood ng 10/31>> Urine>> No growth 10/29>> Sputum >>ng  Antimicrobials:  10/29 Merrem >>11/1 10/29 Vanc >> 10/31  Subjective:  Extubated yesterday and tolerated well.   Objective   Blood pressure (!) 161/64, pulse (!) 140, temperature 99 F (37.2 C), temperature source Oral, resp. rate 18, height 5\' 4"  (1.626 m), weight 87 kg, SpO2 98 %.        Intake/Output Summary (Last 24 hours) at 05/19/2018 1002 Last data filed at 05/19/2018 0700 Gross per 24 hour  Intake 681.45 ml  Output 1325 ml  Net -643.55 ml   Filed Weights   05/17/18 0500 05/18/18 0454 05/19/18 0444  Weight: 96.2 kg 93.5 kg 87 kg    Physical Exam: Elderly appearing female in NAD [place]/AT, PERRL Respirations even, unlabored, no distress.  RRR, 2/6 SEM Abdomen soft, non-tender, non-distended.  Alert, oriented, following commands.  No edema  Resolved Hospital Problem list   Cardiogenic shock, possible sepsis, metabolic acidosis, rectal bleeding.   Assessment & Plan:   A. fib with RVR > had converted to NSR, now back to AF - Continue amiodarone infusion  - Cardiology following - Continue home metoprolol  Severe aortic stenosis Severe TR Severe pulmonary hypertension - PA peak pressure >169mmHg Hx HTN - Cardiology following: will need R/L HC at some point.   Acute Respiratory Failure Post Arrest Plan: Tolerating extubation well. Remains on 2L .  AKI  -resolving - Follow BMP - DC HD cath  Acute on chronic anemia s/p 5u PRBC so far, last 11/2 Myelodysplastic syndrome Thrombocytopenia>> improving - Goal hemoglobin greater than 8 - Follow-up CBC daily  Long QTc - avoid QT prolonging drugs  Shock Liver LFT's are down trending Plan: Trend LFT's intermittently  Avoid hepato-toxic medications No Tylenol   Hypomagnesemia Plan- Replete Mg 2G   DM-2, uncontrolled Plan- SSI Keep lantus 20 U daily   Disposition / Summary of Today's Plan 05/19/18   Tolerating extubation well Done HD, will remove access.  Amiodarone infusion, remains AF but rate is controlled.  Pending further cardiac workup including R/L HC.      Code Status: Full code Family Communication: No family at bedside  My critical care time excluding procedures: 30 minutes   Georgann Housekeeper, AGACNP-BC [place] Pager [phone] or [phone])  [phone]  05/19/2018 10:13 AM

## 2018-05-19 NOTE — Progress Notes (Signed)
Hypoglycemic Event  CBG: 1134 63  Treatment: 25ML D50  Symptoms: none  Follow-up CBG: Time:1247 CBG Result:107  Possible Reasons for Event NPO Comments/MD notified:NA    Elenora Gamma

## 2018-05-19 NOTE — Progress Notes (Signed)
Inpatient Diabetes Program Recommendations  AACE/ADA: New Consensus Statement on Inpatient Glycemic Control (2019)  Target Ranges:  Prepandial:   less than 140 mg/dL      Peak postprandial:   less than 180 mg/dL (1-2 hours)      Critically ill patients:  140 - 180 mg/dL   Results for SAUNDRA, GIN (MRN [id number]) as of 05/19/2018 12:22  Ref. Range 05/18/2018 04:20 05/18/2018 11:00 05/18/2018 15:58 05/18/2018 19:30 05/18/2018 23:35 05/19/2018 03:40 05/19/2018 07:47 05/19/2018 11:34  Glucose-Capillary Latest Ref Range: 70 - 99 mg/dL 179 (H) 163 (H) 171 (H) 84 97 84 74 63 (L)   Review of Glycemic Control  Diabetes history: DM2 Outpatient Diabetes medications: Lantus 10 units daily, Janumet XR 50-500 mg daily Current orders for Inpatient glycemic control: Lantus 20 units daily, Novolog 0-20 units Q4H  Inpatient Diabetes Program Recommendations:  Insulin - Basal: Noted glucose today 84/74/63 mg/dl. Lantus was NOT GIVEN today but last received Lantus 20 units on 05/18/18 at 11:01 am. Please consider decreasing Lantus to 9 units daily (based on 87 kg x 0.1 units).  Thanks, Barnie Alderman, RN, MSN, CDE Diabetes Coordinator Inpatient Diabetes Program [phone] (Team Pager from 8am to 5pm)

## 2018-05-19 NOTE — Progress Notes (Signed)
[facility] Progress Note Patient Name: Patricia Mata DOB: April 09, 1956 MRN: [id number]   Date of Service  05/19/2018  HPI/Events of Note  Request to add CBC to AM labs.  eICU Interventions  Will order: 1. CBC with platelets now.      Intervention Category Minor Interventions: Clinical assessment - ordering diagnostic tests  Albirda Shiel Cornelia Copa 05/19/2018, 5:12 AM

## 2018-05-19 NOTE — Consult Note (Addendum)
[facility] Nurse wound consult note Reason for Consult: Consult requested for buttocks and sacrum. Pt is frequently incontinent of loose thick gelatinous stools and Flexiseal would not be appropriate for containment.  This has caused moisture associated skin damage and full thickness skin loss to peri-rectal and sacrum area. Wound type: Sacrum with partial thickness fissure related to moisture; 4X.1X.1cm, pink and moist Lower bilat buttocks with full thickness skin loss in patchy areas; affected area is 8X8X.1cm, red and moist.  This is NOT a pressure injury.   Dressing procedure/placement/frequency: Previous foam dressing was soiled underneath with stool.  Leave foam dressing off and apply barrier cream and antifungal powder to repel moisture and promote healing.  Pt is on a low airloss bed to reduce pressure.  No family present to discuss plan of care. Please re-consult if further assistance is needed.  Thank-you,  Julien Girt MSN, [place], [place], [place], [place]

## 2018-05-19 NOTE — Progress Notes (Signed)
ANTICOAGULATION CONSULT NOTE - [facility] for Heparin Indication: atrial fibrillation  Allergies  Allergen Reactions  . Ancef [Cefazolin] Itching    Severe itching- after procedure, ancef was the antibiotic.-04/02/17  . Sulfa Antibiotics Rash and Other (See Comments)    Blisters, also    Patient Measurements: Height: 5\' 4"  (162.6 cm) Weight: 191 lb 12.8 oz (87 kg) IBW/kg (Calculated) : 54.7  Vital Signs: Temp: 99 F (37.2 C) (11/05 0749) Temp Source: Oral (11/05 0749) BP: 161/64 (11/05 0700)  Labs: Recent Labs    05/17/18 0345  05/18/18 0418 05/18/18 1739 05/18/18 2331 05/19/18 0336 05/19/18 0444  HGB 8.2*  --  9.0*  --   --  8.3*  --   HCT 24.5*  --  26.3*  --   --  25.4*  --   PLT 98*  --  114*  --   --  122*  --   APTT 35  --  33  --   --  150*  --   LABPROT 15.1  --   --   --   --   --   --   INR 1.20  --   --   --   --   --   --   HEPARINUNFRC  --   --   --   --  0.40  --  0.49  CREATININE 1.03*   < > 1.05* 1.09*  --  1.02*  --    < > = values in this interval not displayed.    Estimated Creatinine Clearance: 60.2 mL/min (A) (by C-G formula based on SCr of 1.02 mg/dL (H)).   Medical History: Past Medical History:  Diagnosis Date  . Cancer ([place])   . Diabetes mellitus without complication ([place])   . Hypertension   . Macrocytic anemia     Assessment: 62yo female with hx severe AS, severe TR, severe pulmonary HTN, stage 3 colon cancer and myelodysplastic syndrome and anemia requiring frequent transfusions presented 10/28 with dyspnea found to have AFib with RVR, acute on chronic anemia and AKI. Pharmacy consulted to start heparin for atrial fibrillation.  Heparin level therapeutic at 0.49, pltc stable.  Goal of Therapy:  Heparin level 0.3-0.7 units/ml Monitor platelets by anticoagulation protocol: Yes   Plan:  -Continue heparin 1400 units/hr -Daily heparin level and CBC  Arrie Senate, PharmD, BCPS Clinical  Pharmacist [phone] Please check AMION for all [place] numbers 05/19/2018

## 2018-05-20 ENCOUNTER — Inpatient Hospital Stay (HOSPITAL_COMMUNITY): Payer: BC Managed Care – PPO

## 2018-05-20 LAB — RENAL FUNCTION PANEL
Albumin: 2.3 g/dL — ABNORMAL LOW (ref 3.5–5.0)
Albumin: 2.5 g/dL — ABNORMAL LOW (ref 3.5–5.0)
Anion gap: 8 (ref 5–15)
Anion gap: 11 (ref 5–15)
BUN: 39 mg/dL — ABNORMAL HIGH (ref 8–23)
BUN: 37 mg/dL — ABNORMAL HIGH (ref 8–23)
CO2: 26 mmol/L (ref 22–32)
CO2: 24 mmol/L (ref 22–32)
Calcium: 9 mg/dL (ref 8.9–10.3)
Calcium: 9.3 mg/dL (ref 8.9–10.3)
Chloride: 105 mmol/L (ref 98–111)
Chloride: 103 mmol/L (ref 98–111)
Creatinine, Ser: 0.9 mg/dL (ref 0.44–1.00)
Creatinine, Ser: 0.94 mg/dL (ref 0.44–1.00)
GFR calc Af Amer: >60 mL/min (ref >60)
GFR calc Af Amer: >60 mL/min (ref >60)
GFR calc non Af Amer: >60 mL/min (ref >60)
GFR calc non Af Amer: >60 mL/min (ref >60)
Glucose, Bld: 164 mg/dL — ABNORMAL HIGH (ref 70–99)
Glucose, Bld: 100 mg/dL — ABNORMAL HIGH (ref 70–99)
Phosphorus: 4.6 mg/dL (ref 2.5–4.6)
Phosphorus: 4.2 mg/dL (ref 2.5–4.6)
Potassium: 3.8 mmol/L (ref 3.5–5.1)
Potassium: 3.9 mmol/L (ref 3.5–5.1)
Sodium: 139 mmol/L (ref 135–145)
Sodium: 138 mmol/L (ref 135–145)

## 2018-05-20 LAB — GLUCOSE, CAPILLARY
Glucose-Capillary: 114 mg/dL — ABNORMAL HIGH (ref 70–99)
Glucose-Capillary: 126 mg/dL — ABNORMAL HIGH (ref 70–99)
Glucose-Capillary: 127 mg/dL — ABNORMAL HIGH (ref 70–99)
Glucose-Capillary: 145 mg/dL — ABNORMAL HIGH (ref 70–99)
Glucose-Capillary: 146 mg/dL — ABNORMAL HIGH (ref 70–99)
Glucose-Capillary: 95 mg/dL (ref 70–99)

## 2018-05-20 LAB — CBC
HCT: 24.6 % — ABNORMAL LOW (ref 36.0–46.0)
Hemoglobin: 8.1 g/dL — ABNORMAL LOW (ref 12.0–15.0)
MCH: 29.3 pg (ref 26.0–34.0)
MCHC: 32.9 g/dL (ref 30.0–36.0)
MCV: 89.1 fL (ref 80.0–100.0)
Platelets: 143 10*3/uL — ABNORMAL LOW (ref 150–400)
RBC: 2.76 MIL/uL — ABNORMAL LOW (ref 3.87–5.11)
RDW: 18.5 % — ABNORMAL HIGH (ref 11.5–15.5)
WBC: 5.8 10*3/uL (ref 4.0–10.5)
nRBC: 0 % (ref 0.0–0.2)

## 2018-05-20 LAB — MAGNESIUM: Magnesium: 1.7 mg/dL (ref 1.7–2.4)

## 2018-05-20 LAB — HEPARIN LEVEL (UNFRACTIONATED): Heparin Unfractionated: 0.46 IU/mL (ref 0.30–0.70)

## 2018-05-20 MED ORDER — GLUCERNA SHAKE PO LIQD
237.0000 mL | Freq: Two times a day (BID) | ORAL | Status: DC
  Administered 2018-05-20: 237 mL via ORAL

## 2018-05-20 MED ORDER — PANTOPRAZOLE SODIUM 40 MG PO TBEC
40.0000 mg | DELAYED_RELEASE_TABLET | Freq: Every day | ORAL | Status: DC
  Administered 2018-05-20 – 2018-05-26 (×7): 40 mg via ORAL
  Filled 2018-05-20 (×7): qty 1

## 2018-05-20 MED ORDER — ONDANSETRON HCL 4 MG/2ML IJ SOLN: 4.0000 mg | Freq: Four times a day (QID) | INTRAMUSCULAR | Status: DC | PRN

## 2018-05-20 MED ORDER — ACETAMINOPHEN 325 MG PO TABS
650.0000 mg | ORAL_TABLET | ORAL | Status: DC | PRN
  Administered 2018-05-20 – 2018-05-26 (×3): 650 mg via ORAL
  Filled 2018-05-20 (×4): qty 2

## 2018-05-20 MED ORDER — MAGNESIUM SULFATE 2 GM/50ML IV SOLN
2.0000 g | Freq: Once | INTRAVENOUS | Status: AC
  Administered 2018-05-20: 2 g via INTRAVENOUS
  Filled 2018-05-20: qty 50

## 2018-05-20 NOTE — Progress Notes (Signed)
Physical Therapy Treatment Patient Details Name: Patricia Mata MRN: [id number] DOB: [birth year] Today's Date: 05/20/2018    History of Present Illness 62yo female with hx severe AS, severe TR, severe pulmonary HTN, stage 3 colon cancer and myelodysplastic syndrome and anemia requiring frequent transfusions presented 10/28 with dyspnea found to have AFib with RVR, acute on chronic anemia and AKI.  Cardiology requesting ICU admission per PCCM. She developed severe metabolic acidosis and shock requiring mechanical ventilation.  Pt was intubated for 6 days with extubation on 05/18/18    PT Comments    Pt admitted with above diagnosis. Pt currently with functional limitations due to balance and endurance deficits. Pt could not initially stand to RW.  Pt was able to pivot with mod assist of 2 with bil LE instability.  However, did attemptto stand to RW after pivot and pt stood better to RW with standing min guard for 1 minute.  Dififcult with transitions and cognition impairing pts ability. Will continue acute PT. Pt will benefit from skilled PT to increase their independence and safety with mobility to allow discharge to the venue listed below.     Follow Up Recommendations  CIR     Equipment Recommendations  (TBD)    Recommendations for Other Services Rehab consult     Precautions / Restrictions Precautions Precautions: Fall Restrictions Weight Bearing Restrictions: No    Mobility  Bed Mobility Overal bed mobility: Needs Assistance Bed Mobility: Supine to Sit     Supine to sit: +2 for physical assistance;Mod assist     General bed mobility comments: Max multi modal cues to sequence task of coming to EOB, increased time and effort. Tactile cues to initiate LE movement to EOB.  Transfers Overall transfer level: Needs assistance Equipment used: 2 person hand held assist Transfers: Sit to/from [facility] Sit to Stand: Mod assist;+2 physical assistance Stand pivot  transfers: Mod assist;+2 physical assistance       General transfer comment: Moderate assist to power to standing. Increased time and effort to perfrom with multi modal cues. Attempted to take steps but patient with poor ability to shift weight and initiate stride. Therefore pivot to chair with use of pad under bottom.  After transfer, stood pt once more to RW and pt able to power up with mod assist and stood 1 minute with min guard assist.  Needed assist to control descent into chair.   Ambulation/Gait             General Gait Details: NT   Stairs             Wheelchair Mobility    Modified Rankin (Stroke Patients Only)       Balance Overall balance assessment: Needs assistance Sitting-balance support: Feet supported Sitting balance-Leahy Scale: Fair Sitting balance - Comments: initially required assist to maintain balance but then able to maintain wihtout external support Postural control: Posterior lean Standing balance support: During functional activity;Bilateral upper extremity supported Standing balance-Leahy Scale: Poor Standing balance comment: reliance on bilateral UEs and external support                            Cognition Arousal/Alertness: Awake/alert Behavior During Therapy: Flat affect Overall Cognitive Status: Impaired/Different from baseline Area of Impairment: Attention;Memory;Following commands;Safety/judgement;Awareness;Problem solving                   Current Attention Level: Sustained Memory: Decreased short-term memory Following Commands: Follows one step commands  with increased time Safety/Judgement: Decreased awareness of safety Awareness: Intellectual Problem Solving: Slow processing;Requires verbal cues;Requires tactile cues;Decreased initiation;Difficulty sequencing        Exercises      General Comments        Pertinent Vitals/Pain Pain Assessment: Faces Faces Pain Scale: Hurts even more Pain  Location: Bilateral Hands and abdomen Pain Descriptors / Indicators: Grimacing;Guarding;Sore Pain Intervention(s): Monitored during session    Home Living                      Prior Function            PT Goals (current goals can now be found in the care plan section) Acute Rehab PT Goals Patient Stated Goal: to go home Progress towards PT goals: Progressing toward goals    Frequency    Min 3X/week      PT Plan Current plan remains appropriate    Co-evaluation              AM-PAC PT "6 Clicks" Daily Activity  Outcome Measure  Difficulty turning over in bed (including adjusting bedclothes, sheets and blankets)?: Unable Difficulty moving from lying on back to sitting on the side of the bed? : Unable Difficulty sitting down on and standing up from a chair with arms (e.g., wheelchair, bedside commode, etc,.)?: Unable Help needed moving to and from a bed to chair (including a wheelchair)?: A Lot Help needed walking in hospital room?: A Lot Help needed climbing 3-5 steps with a railing? : Total 6 Click Score: 8    End of Session Equipment Utilized During Treatment: Gait belt Activity Tolerance: Patient tolerated treatment well;Patient limited by fatigue Patient left: in chair;with call bell/phone within reach;with family/visitor present;with chair alarm set Nurse Communication: Mobility status PT Visit Diagnosis: Difficulty in walking, not elsewhere classified (R26.2);Muscle weakness (generalized) (M62.81)     Time: 5883-2549 PT Time Calculation (min) (ACUTE ONLY): 26 min  Charges:  $Therapeutic Activity: 23-37 mins                     [place] Pager:  [phone]  Office:  [place] 05/20/2018, 1:43 PM

## 2018-05-20 NOTE — Progress Notes (Signed)
  Speech Language Pathology Treatment: Dysphagia  Patient Details Name: Patricia Mata MRN: [id number] DOB: Aug 04, 1955 Today's Date: 05/20/2018 Time: 9628-3662 SLP Time Calculation (min) (ACUTE ONLY): 12 min  Assessment / Plan / Recommendation Clinical Impression  Pt was alert and pleasant however slightly distractible, requiring min-mod cues from clinician to sustain attention to dysphagia treatment session. She reported use of slow rate and small bites/sips during meals since initial swallow evaluation, however overall decreased appetite and inital coughing during first meal since diet initiation. SLP educated pt and husband regarding importance of continued use of safe swallow precautions and maximizing consumption of highly nutritious POs when appetite present. Given min-mod verbal cues, pt accepted several sips of thin from a straw with one instance of delayed cough noted. Of note, pt also presented with baseline cough upon entering room. No difficulties deglutating regular (graham cracker) observed. Recommend continue regular texture, thin liquids, use slow rate, small bites/sips, minimize environmental distractions. ST will follow up once more to ensure safety and efficiency with current diet.   HPI HPI: 62yo female with hx severe AS, severe TR, severe pulmonary HTN, stage 3 colon cancer and myelodysplastic syndrome and anemia requiring frequent transfusions presented 10/28 with dyspnea found to have AFib with RVR, acute on chronic anemia and AKI.  Cardiology requesting ICU admission per PCCM. She developed severe metabolic acidosis and shock requiring mechanical ventilation.  Pt was intubated for 6 days with extubation on 05/18/18 around 9 am.  Most recent CXR from 11/4 shows "Stable haziness in the perihilar regions and lung bases. Findings are nonspecific but may be related to vascular congestion or mild edema"      SLP Plan  Continue with current plan of care       Recommendations  Diet  recommendations: Regular;Thin liquid Liquids provided via: Straw;Cup Medication Administration: Whole meds with liquid Supervision: Patient able to self feed Compensations: Minimize environmental distractions;Slow rate;Small sips/bites Postural Changes and/or Swallow Maneuvers: Seated upright 90 degrees                Oral Care Recommendations: Oral care BID Follow up Recommendations: None SLP Visit Diagnosis: Dysphagia, unspecified (R13.10) Plan: Continue with current plan of care       Jettie Booze, Student SLP             Jettie Booze 05/20/2018, 3:44 PM

## 2018-05-20 NOTE — Progress Notes (Signed)
Pt required 3 person assist to go back to bed from a recliner chair.  Pt was unable to grab on to a walker or steady.  She was unable to stand up.  Her wound on buttocks was oozing.  Covered with foam dressing.  Idolina Primer, RN

## 2018-05-20 NOTE — Progress Notes (Signed)
Nutrition Follow-up  DOCUMENTATION CODES:   Obesity unspecified  INTERVENTION:    Glucerna Shake po BID, each supplement provides 220 kcal and 10 grams of protein  NUTRITION DIAGNOSIS:   Inadequate oral intake related to decreased appetite as evidenced by per patient/family report.  Ongoing  GOAL:   Patient will meet greater than or equal to 90% of their needs  Progressing  MONITOR:   PO intake, Supplement acceptance, Skin  ASSESSMENT:   62 yo Female with severe left ear and pulmonary hypertension and a history of myelodysplastic syndrome was admitted ith A. fib/RVR, anemia and AKI which was felt to be due to low perfusion.  Extubated 11/4. No longer requiring CVVHD. HD cath has been removed. Plans for left and right heart cath tomorrow.  SLP following. Mild aspiration risk identified. Diet advanced to CHO modified with thin liquids. Patient reports decreased appetite and that she is eating poorly. Drinks PO supplements at home sometimes. Agreed to try Glucerna Shake supplements between meals.  Labs reviewed. CBG's: 145-95 Medications reviewed and include Lantus.    Diet Order:   Diet Order            Diet Carb Modified Fluid consistency: Thin; Room service appropriate? Yes  Diet effective now              EDUCATION NEEDS:   Not appropriate for education at this time  Skin:  Skin Assessment: Reviewed RN Assessment  Last BM:  11/4 (type 7)  Height:   Ht Readings from Last 1 Encounters:  05/11/18 5\' 4"  (1.626 m)    Weight:   Wt Readings from Last 1 Encounters:  05/20/18 94.2 kg    Ideal Body Weight:  54.5 kg  BMI:  Body mass index is 35.65 kg/m.  Estimated Nutritional Needs:   Kcal:  1600-1800  Protein:  90-110 gm  Fluid:  1.6-1.8 L    Molli Barrows, RD, LDN, [place] Pager [phone] After Hours Pager [phone]

## 2018-05-20 NOTE — Progress Notes (Signed)
ANTICOAGULATION CONSULT NOTE - [place] for Heparin Indication: atrial fibrillation  Allergies  Allergen Reactions  . Ancef [Cefazolin] Itching    Severe itching- after procedure, ancef was the antibiotic.-04/02/17  . Sulfa Antibiotics Rash and Other (See Comments)    Blisters, also    Patient Measurements: Height: 5\' 4"  (162.6 cm) Weight: 207 lb 10.8 oz (94.2 kg) IBW/kg (Calculated) : 54.7  Vital Signs: Temp: 99.4 F (37.4 C) (11/06 0505) Temp Source: Oral (11/06 0505) BP: 170/59 (11/06 0505) Pulse Rate: 99 (11/06 0505)  Labs: Recent Labs    05/18/18 0418  05/18/18 2331 05/19/18 0336 05/19/18 0444 05/19/18 1805 05/20/18 0709  HGB 9.0*  --   --  8.3*  --   --  8.1*  HCT 26.3*  --   --  25.4*  --   --  24.6*  PLT 114*  --   --  122*  --   --  143*  APTT 33  --   --  150*  --   --   --   HEPARINUNFRC  --   --  0.40  --  0.49  --  0.46  CREATININE 1.05*   < >  --  1.02*  --  0.94 0.90   < > = values in this interval not displayed.    Estimated Creatinine Clearance: 71.2 mL/min (by C-G formula based on SCr of 0.9 mg/dL).   Medical History: Past Medical History:  Diagnosis Date  . Cancer ([place])   . Diabetes mellitus without complication ([place])   . Hypertension   . Macrocytic anemia     Assessment: 62yo female with hx severe AS, severe TR, severe pulmonary HTN, stage 3 colon cancer and myelodysplastic syndrome and anemia requiring frequent transfusions presented 10/28 with dyspnea found to have AFib with RVR, acute on chronic anemia and AKI. Pharmacy consulted to start heparin for atrial fibrillation.  Heparin level therapeutic at 0.46, pltc stable.  But patient oozing from HD cath site - stop heparin today - f/u post cath in am  Goal of Therapy:  Heparin level 0.3-0.7 units/ml Monitor platelets by anticoagulation protocol: Yes   Plan:  No heparin  F/u in am  Bonnita Nasuti Pharm.D. CPP, BCPS Clinical  Pharmacist [phone] 05/20/2018 8:58 AM

## 2018-05-20 NOTE — Progress Notes (Signed)
Rehab Admissions Coordinator Note:  Patient was screened by Cleatrice Burke for appropriateness for an Inpatient Acute Rehab Consult per PT recommendation.   At this time, we are recommending Inpatient Rehab consult as well as OT eval. Please advise.  Danne Baxter, RN, MSN Rehab Admissions Coordinator [phone] 05/20/2018 6:30 PM

## 2018-05-20 NOTE — Progress Notes (Signed)
Inpatient Diabetes Program Recommendations  AACE/ADA: New Consensus Statement on Inpatient Glycemic Control (2015)  Target Ranges:  Prepandial:   less than 140 mg/dL      Peak postprandial:   less than 180 mg/dL (1-2 hours)      Critically ill patients:  140 - 180 mg/dL   Lab Results  Component Value Date   GLUCAP 145 (H) 05/20/2018   HGBA1C 11.6 (H) 06/14/2017    Review of Glycemic Control Results for Patricia Mata, Patricia Mata (MRN [id number]) as of 05/20/2018 09:28  Ref. Range 05/19/2018 23:56 05/20/2018 00:39 05/20/2018 05:07 05/20/2018 07:40  Glucose-Capillary Latest Ref Range: 70 - 99 mg/dL 60 (L) 127 (H) 126 (H) 145 (H)   Diabetes history: DM2 Outpatient Diabetes medications: Lantus 10 units daily, Janumet XR 50-500 mg daily Current orders for Inpatient glycemic control: Lantus 10 units daily, Novolog 0-20 units Q4H  Inpatient Diabetes Program Recommendations:   Noted decrease to Lantus dosing and hypoglycemic event on 11/5 to 60 mg/dL following 3 units of Novolog.  Consider changing correction to Novolog 0-9 units TID.  Thanks, Bronson Curb, MSN, RNC-OB Diabetes Coordinator [phone] (8a-5p)

## 2018-05-20 NOTE — Progress Notes (Signed)
PT Evaluation  05/20/18 0800  PT Visit Information  Last PT Received On 05/19/18  Assistance Needed +2  History of Present Illness 62yo female with hx severe AS, severe TR, severe pulmonary HTN, stage 3 colon cancer and myelodysplastic syndrome and anemia requiring frequent transfusions presented 10/28 with dyspnea found to have AFib with RVR, acute on chronic anemia and AKI.  Cardiology requesting ICU admission per PCCM. She developed severe metabolic acidosis and shock requiring mechanical ventilation.  Pt was intubated for 6 days with extubation on 05/18/18  Precautions  Precautions Fall  Restrictions  Weight Bearing Restrictions No  Home Living  Family/patient expects to be discharged to: Private residence  Living Arrangements Spouse/significant other;Children  Available Help at Discharge Family  Type of [place] One level  [place] unit  Tax adviser - 2 wheels;Shower seat  Prior Function  Level of Independence Independent  Communication  Communication No difficulties  Pain Assessment  Pain Assessment Faces  Faces Pain Scale 6  Pain Location Bilateral Hands and abdomen  Pain Descriptors / Indicators Grimacing;Guarding;Sore  Pain Intervention(s) Monitored during session  Cognition  Arousal/Alertness Awake/alert  Behavior During Therapy Flat affect  Overall Cognitive Status Impaired/Different from baseline  Area of Impairment Attention;Memory;Following commands;Safety/judgement;Awareness;Problem solving  Current Attention Level Sustained  Memory Decreased short-term memory  Following Commands Follows one step commands with increased time  Safety/Judgement Decreased awareness of safety  Awareness Intellectual  Problem Solving Slow processing;Requires verbal cues;Requires tactile cues;Decreased initiation;Difficulty sequencing  Upper Extremity Assessment  Upper Extremity  Assessment Generalized weakness  Lower Extremity Assessment  Lower Extremity Assessment Generalized weakness  Bed Mobility  Overal bed mobility Needs Assistance  Bed Mobility Supine to Sit  Supine to sit +2 for physical assistance;Mod assist  General bed mobility comments Max multi modal cues to sequence task of coming to EOB, increased time and effort. Tactile cues to initiate LE movement to EOB.  Transfers  Overall transfer level Needs assistance  Equipment used 2 person hand held assist  Transfers Sit to/from [employer] Transfers  Sit to Stand Mod assist;+2 physical assistance  Stand pivot transfers Mod assist;+2 physical assistance  General transfer comment Moderate assist to power to standing. Increased time and effort to perfrom with multi modal cues. Attempted to take steps but patient with poor ability to shift weight and initiate stride. Therefore pivot to chair. I  Ambulation/Gait  General Gait Details NT  Balance  Overall balance assessment Needs assistance  Sitting-balance support Feet supported  Sitting balance-Leahy Scale Fair  Sitting balance - Comments initially required assist to maintain balance but then able to maintain wihtout external support  Postural control Posterior lean  Standing balance support During functional activity  Standing balance-Leahy Scale Poor  Standing balance comment reliance on bilateral   PT - End of Session  Equipment Utilized During Treatment Gait belt  Activity Tolerance Patient tolerated treatment well;Patient limited by fatigue  Patient left in chair;with call bell/phone within reach;with family/visitor present  Nurse Communication Mobility status  PT Assessment  PT Recommendation/Assessment Patient needs continued PT services  PT Visit Diagnosis Difficulty in walking, not elsewhere classified (R26.2);Muscle weakness (generalized) (M62.81)  PT Problem List Decreased strength;Decreased activity tolerance;Decreased  balance;Decreased mobility;Decreased cognition;Decreased safety awareness;Pain  PT Plan  PT Frequency (ACUTE ONLY) Min 3X/week  PT Treatment/Interventions (ACUTE ONLY) DME instruction;Gait training;Functional mobility training;Therapeutic activities;Therapeutic exercise;Balance training;Cognitive remediation;Patient/family education  AM-PAC PT "6 Clicks" Daily Activity Outcome  Measure  Difficulty turning over in bed (including adjusting bedclothes, sheets and blankets)? 1  Difficulty moving from lying on back to sitting on the side of the bed?  1  Difficulty sitting down on and standing up from a chair with arms (e.g., wheelchair, bedside commode, etc,.)? 1  Help needed moving to and from a bed to chair (including a wheelchair)? 2  Help needed walking in hospital room? 2  Help needed climbing 3-5 steps with a railing?  1  6 Click Score 8  Mobility G Code  CM  PT Recommendation  Recommendations for Other Services Rehab consult  Follow Up Recommendations CIR  PT equipment  (TBD)  Individuals Consulted  Consulted and Agree with Results and Recommendations Patient  Acute Rehab PT Goals  Patient Stated Goal to go home  PT Goal Formulation With patient/family  Time For Goal Achievement 06/02/18  Potential to Achieve Goals Good  Written Expression  Dominant Hand Right  Clinical impression:  Pt presents with deficits as indicated above.  Will benefit from skilled PT to address deficits and maximize recovery.  Prior to admission, pt was independent.  At this time, pt presents with physical and cognitive deficits impeding mobility.  Feel pt would benefit from comprehensive inpatient  rehab.  Will recommend CIR at this time.   Pt was seen on 05/19/18 by Lannie Fields and information regarding evaluation was entered into flowsheet,  however note was not populated.  This impression statement was dictated to [facility] from [facility] on 05/20/18.   [place] Pager:  [phone]  Office:  [phone]

## 2018-05-20 NOTE — Progress Notes (Addendum)
Progress Note  Patient Name: Patricia Mata Date of Encounter: 05/20/2018  Primary Cardiologist: Quay Burow, MD   Subjective   Denies chest pain or SOB. Says she could do cath this week.  Very weak, unable to move self around in the bed w/out help.   Inpatient Medications    Scheduled Meds: . sodium chloride   Intravenous Once  . sodium chloride   Intravenous Once  . sodium chloride   Intravenous Once  . Chlorhexidine Gluconate Cloth  6 each Topical Daily  . Gerhardt's butt cream   Topical BID  . insulin aspart  0-20 Units Subcutaneous Q4H  . insulin glargine  10 Units Subcutaneous Daily  . latanoprost  1 drop Both Eyes QHS  . mouth rinse  15 mL Mouth Rinse BID  . pantoprazole sodium  40 mg Per Tube Daily  . sodium chloride flush  10-40 mL Intracatheter Q12H   Continuous Infusions: . sodium chloride 10 mL/hr at 05/18/18 0800  . amiodarone 30 mg/hr (05/19/18 2000)  . heparin 1,400 Units/hr (05/19/18 2000)   PRN Meds: sodium chloride, hydrALAZINE, levalbuterol, metoprolol tartrate, sodium chloride flush   Vital Signs    Vitals:   05/19/18 2155 05/19/18 2322 05/20/18 0503 05/20/18 0505  BP: (!) 155/58 (!) 155/56  (!) 170/59  Pulse: 94   99  Resp: 20   17  Temp:    99.4 F (37.4 C)  TempSrc:    Oral  SpO2: 97% 96%    Weight:   94.2 kg   Height:        Intake/Output Summary (Last 24 hours) at 05/20/2018 0744 Last data filed at 05/20/2018 0200 Gross per 24 hour  Intake 1468.82 ml  Output 850 ml  Net 618.82 ml   Filed Weights   05/18/18 0454 05/19/18 0444 05/20/18 0503  Weight: 93.5 kg 87 kg 94.2 kg    Telemetry    SR, ST, occ PVCs and PACs, SVT episodes as well, not prolonged - Personally Reviewed  ECG    None today - Personally Reviewed  Physical Exam   General: Well developed, well nourished, female in no acute distress Head: Eyes PERRLA, No xanthomas.   Normocephalic and atraumatic Lungs: few rales bases Heart: slightly irregular R&R, S1 S2,  without RG. 2/6 SEM,  Pulses are 2+ & equal. No JVD. Abdomen: Bowel sounds are present, abdomen soft and non-tender without masses or  hernias noted. Msk: extremely weak and poor muscle tone for age. Extremities: No clubbing, cyanosis or edema.    Skin:  No rashes or lesions noted. Neuro: Alert and oriented X 3. Psych:  flat affect, responds appropriately  Labs    Chemistry Recent Labs  Lab 05/16/18 1212  05/17/18 0345  05/18/18 0418 05/18/18 1739 05/19/18 0336 05/19/18 1805  NA  --    < > 136   < > 138 138 140 140  K  --    < > 4.0   < > 3.9 3.7 3.8 3.8  CL  --    < > 105   < > 106 102 104 104  CO2  --    < > 26   < > 25 26 25 26   GLUCOSE  --    < > 154*   < > 191* 124* 90 189*  BUN  --    < > 35*   < > 50* 55* 53* 44*  CREATININE  --    < > 1.03*   < > 1.05*  1.09* 1.02* 0.94  CALCIUM  --    < > 8.7*   < > 9.2 8.6* 8.5* 9.0  PROT 5.6*  --  5.4*  --  5.9*  --   --   --   ALBUMIN 2.2*   < > 2.1*   < > 2.2* 2.1* 2.2* 2.4*  AST 249*  --  192*  --  143*  --   --   --   ALT 151*  --  120*  --  101*  --   --   --   ALKPHOS 105  --  108  --  128*  --   --   --   BILITOT 5.7*  --  5.8*  --  7.3*  --   --   --   GFRNONAA  --    < > 57*   < > 55* 53* 57* >60  GFRAA  --    < > >60   < > >60 >60 >60 >60  ANIONGAP  --    < > 5   < > 7 10 11 10    < > = values in this interval not displayed.     Hematology Recent Labs  Lab 05/17/18 0345 05/18/18 0418 05/19/18 0336  WBC 6.0 7.2 5.5  RBC 2.79* 3.03* 2.80*  HGB 8.2* 9.0* 8.3*  HCT 24.5* 26.3* 25.4*  MCV 87.8 86.8 90.7  MCH 29.4 29.7 29.6  MCHC 33.5 34.2 32.7  RDW 17.6* 17.7* 18.2*  PLT 98* 114* 122*    Cardiac Enzymes Recent Labs  Lab 05/14/18 0854 05/14/18 1431 05/14/18 2037  TROPONINI 0.15* 0.15* 0.13*   No results for input(s): TROPIPOC in the last 168 hours.   BNP No results for input(s): BNP, PROBNP in the last 168 hours.   Radiology    No results found.  Cardiac Studies   ECHO 05/12/2017 - Left  ventricle: The cavity size was normal. There was moderate concentric hypertrophy. Systolic function was vigorous. The estimated ejection fraction was in the range of 65% to 70%. Wall motion was normal; there were no regional wall motion abnormalities. Features are consistent with a pseudonormal left ventricular filling pattern, with concomitant abnormal relaxation and increased filling pressure (grade 2 diastolic dysfunction). Doppler parameters are consistent with high ventricular filling pressure. - Aortic valve: Trileaflet; moderately thickened, moderately calcified leaflets. Valve mobility was restricted. There was moderate stenosis. Mean gradient (S): 22 mm Hg. VTI ratio of LVOT to aortic valve: 0.37. Valve area (VTI): 1.28 cm^2. Valve area (Vmax): 1.01 cm^2. Valve area (Vmean): 1.18 cm^2. - Mitral valve: Calcified annulus. Moderate diffuse thickening, calcification, and elongation. The findings are consistent with mild to moderate stenosis. Deceleration time: 324 ms. - Tricuspid valve: There was moderate regurgitation. - Pulmonic valve: There was trivial regurgitation. - Pulmonary arteries: PA peak pressure: 93 mm Hg (S).  Impressions:  - The right ventricular systolic pressure was increased consistent with severe pulmonary hypertension.  Patient Profile     62 y.o. female with AFib, aortic stenosis, PAH, was admitted 10/28 for shock and respiratory failure triggered by sepsis on background of severe aortic stenosis and severe pulmonary artery hypertension, complicated by paroxysmalatrial fibrillation, QT prolongation and oligoanuric renal faulure, anemia, cards following for Afib RVR.   Assessment & Plan    1) AFib:  - now in SR, ST - on IV amio and heparin - currently not getting any PO meds - since tachycardic most of the time, will add metoprolol 6 mg  q 6 hr  2) COPD:  - per CCM - breathing is ok, no wheeze - speech has seen, rec  regular texture w/ thin liquids  3) Anemia:  - hx myelodysplastic syndrome, req transfusions - no evidence of active bleeding  4) troponin elevation and ECG changes in setting of sepsis/major hemodynamic changes:  - EF normal, troponin elevation may have been 2nd acute illness - however, w/ ECG changes as well as severe AS, needs ischemic eval - MD advise if we should put her on the cath board for tomorrow or Friday  5) acute renal failure:  - RF has normalized, UOP improved    6) severe AS:  - echo this admit w/ Mean gradient (S): 22 mm Hg. Peak gradient (S): 50 mm Hg. -  Clarify w/ R/L cath  For questions or updates, please contact [place] Please consult [email or web address] for contact info under       Signed, Rosaria Ferries, PA-C  05/20/2018, 7:44 AM    I have examined the patient and reviewed assessment and plan and discussed with patient.  Agree with above as stated.  Patient had bleeding from the neck site where her dialysis catheter was pulled yesterday.  Stop heparin.  She has a triple lumen catheter in her right IJ for 8 days.  WIll have IV team see if other access is available.  WOuld like to get that catheter out of her neck.    Plan for right and left heart cath tomorrow.  Cardiac catheterization was discussed with the patient fully. The patient understands that risks include but are not limited to stroke (1 in 1000), death (1 in 29), kidney failure [usually temporary] (1 in 500), bleeding (1 in 200), allergic reaction [possibly serious] (1 in 200).  The patient understands and is willing to proceed.     Larae Grooms

## 2018-05-20 NOTE — Progress Notes (Signed)
CRITICAL VALUE ALERT  Critical Value:  CBG 60  Date & Time Notied:  05/19/18  2356  Provider Notified: n/a  Orders Received/Actions taken: pt given Boost/Resource beverage per request.   0040 Repeat CBG 127

## 2018-05-20 NOTE — Progress Notes (Signed)
Pt had a bleeding from post HD cath site in left IJ.  Heparin was stopped per Dr. Hassell Done order.  Idolina Primer, RN

## 2018-05-20 NOTE — Consult Note (Signed)
Spoke with Floor RN, she will DC central line

## 2018-05-20 NOTE — Progress Notes (Signed)
Patient found on the floor beside bed, states she was trying to go to the bathroom.  Patient assessed, states face and left arm hurt, assisted back to bed and vital signs stable.  MD notified, new orders for CT of head and left arm xray obtained.  Family notified of fall and pending interventions.   MD called family with results of CT and xray.

## 2018-05-21 ENCOUNTER — Inpatient Hospital Stay (HOSPITAL_COMMUNITY)
Admission: EM | Disposition: A | Discharge status: Skilled Nursing Facility | Payer: Self-pay | Source: Home / Self Care | Attending: Interventional Cardiology

## 2018-05-21 ENCOUNTER — Inpatient Hospital Stay: Payer: Self-pay

## 2018-05-21 ENCOUNTER — Inpatient Hospital Stay (HOSPITAL_COMMUNITY): Payer: BC Managed Care – PPO

## 2018-05-21 DIAGNOSIS — I272 Pulmonary hypertension, unspecified: Secondary | ICD-10-CM

## 2018-05-21 HISTORY — PX: RIGHT/LEFT HEART CATH AND CORONARY ANGIOGRAPHY: CATH118266

## 2018-05-21 LAB — CBC
HCT: 22.7 % — ABNORMAL LOW (ref 36.0–46.0)
Hemoglobin: 7.5 g/dL — ABNORMAL LOW (ref 12.0–15.0)
MCH: 29.4 pg (ref 26.0–34.0)
MCHC: 33 g/dL (ref 30.0–36.0)
MCV: 89 fL (ref 80.0–100.0)
Platelets: 165 10*3/uL (ref 150–400)
RBC: 2.55 MIL/uL — ABNORMAL LOW (ref 3.87–5.11)
RDW: 18.6 % — ABNORMAL HIGH (ref 11.5–15.5)
WBC: 6.8 10*3/uL (ref 4.0–10.5)
nRBC: 0 % (ref 0.0–0.2)

## 2018-05-21 LAB — RENAL FUNCTION PANEL
Albumin: 2.3 g/dL — ABNORMAL LOW (ref 3.5–5.0)
Anion gap: 11 (ref 5–15)
BUN: 32 mg/dL — ABNORMAL HIGH (ref 8–23)
CO2: 22 mmol/L (ref 22–32)
Calcium: 9.3 mg/dL (ref 8.9–10.3)
Chloride: 106 mmol/L (ref 98–111)
Creatinine, Ser: 0.86 mg/dL (ref 0.44–1.00)
GFR calc Af Amer: >60 mL/min (ref >60)
GFR calc non Af Amer: >60 mL/min (ref >60)
Glucose, Bld: 124 mg/dL — ABNORMAL HIGH (ref 70–99)
Phosphorus: 4.5 mg/dL (ref 2.5–4.6)
Potassium: 3.9 mmol/L (ref 3.5–5.1)
Sodium: 139 mmol/L (ref 135–145)

## 2018-05-21 LAB — POCT I-STAT 3, ART BLOOD GAS (G3+)
Bicarbonate: 23.8 mmol/L (ref 20.0–28.0)
O2 Saturation: 91 %
TCO2: 25 mmol/L (ref 22–32)
pCO2 arterial: 35.3 mmHg (ref 32.0–48.0)
pH, Arterial: 7.438 (ref 7.350–7.450)
pO2, Arterial: 58 mmHg — ABNORMAL LOW (ref 83.0–108.0)

## 2018-05-21 LAB — GLUCOSE, CAPILLARY
Glucose-Capillary: 102 mg/dL — ABNORMAL HIGH (ref 70–99)
Glucose-Capillary: 105 mg/dL — ABNORMAL HIGH (ref 70–99)
Glucose-Capillary: 114 mg/dL — ABNORMAL HIGH (ref 70–99)
Glucose-Capillary: 115 mg/dL — ABNORMAL HIGH (ref 70–99)
Glucose-Capillary: 125 mg/dL — ABNORMAL HIGH (ref 70–99)
Glucose-Capillary: 129 mg/dL — ABNORMAL HIGH (ref 70–99)
Glucose-Capillary: 129 mg/dL — ABNORMAL HIGH (ref 70–99)

## 2018-05-21 LAB — POCT I-STAT 3, VENOUS BLOOD GAS (G3P V)
Bicarbonate: 24.9 mmol/L (ref 20.0–28.0)
O2 Saturation: 53 %
TCO2: 26 mmol/L (ref 22–32)
pCO2, Ven: 38.5 mmHg — ABNORMAL LOW (ref 44.0–60.0)
pH, Ven: 7.419 (ref 7.250–7.430)
pO2, Ven: 27 mmHg — CL (ref 32.0–45.0)

## 2018-05-21 LAB — PREPARE RBC (CROSSMATCH)

## 2018-05-21 LAB — MAGNESIUM: Magnesium: 2 mg/dL (ref 1.7–2.4)

## 2018-05-21 SURGERY — RIGHT/LEFT HEART CATH AND CORONARY ANGIOGRAPHY
Anesthesia: LOCAL

## 2018-05-21 MED ORDER — VERAPAMIL HCL 2.5 MG/ML IV SOLN
INTRAVENOUS | Status: AC
  Filled 2018-05-21: qty 2

## 2018-05-21 MED ORDER — SODIUM CHLORIDE 0.9% FLUSH: 3.0000 mL | INTRAVENOUS | Status: DC | PRN

## 2018-05-21 MED ORDER — LIDOCAINE HCL (PF) 1 % IJ SOLN
INTRAMUSCULAR | Status: AC
  Filled 2018-05-21: qty 30

## 2018-05-21 MED ORDER — SODIUM CHLORIDE 0.9% FLUSH
3.0000 mL | Freq: Two times a day (BID) | INTRAVENOUS | Status: DC
  Administered 2018-05-21 (×2): 3 mL via INTRAVENOUS

## 2018-05-21 MED ORDER — IOHEXOL 350 MG/ML SOLN
INTRAVENOUS | Status: DC | PRN
  Administered 2018-05-21: 55 mL via INTRA_ARTERIAL

## 2018-05-21 MED ORDER — SODIUM CHLORIDE 0.9% IV SOLUTION: Freq: Once | INTRAVENOUS | Status: DC

## 2018-05-21 MED ORDER — SODIUM CHLORIDE 0.9 % IV SOLN: 250.0000 mL | INTRAVENOUS | Status: DC | PRN

## 2018-05-21 MED ORDER — HEPARIN (PORCINE) IN NACL 1000-0.9 UT/500ML-% IV SOLN
INTRAVENOUS | Status: AC
  Filled 2018-05-21: qty 1000

## 2018-05-21 MED ORDER — ASPIRIN 81 MG PO CHEW
81.0000 mg | CHEWABLE_TABLET | ORAL | Status: AC
  Administered 2018-05-21: 81 mg via ORAL
  Filled 2018-05-21: qty 1

## 2018-05-21 MED ORDER — HEPARIN SODIUM (PORCINE) 1000 UNIT/ML IJ SOLN
INTRAMUSCULAR | Status: DC | PRN
  Administered 2018-05-21: 4500 [IU] via INTRAVENOUS

## 2018-05-21 MED ORDER — SILDENAFIL CITRATE 20 MG PO TABS
20.0000 mg | ORAL_TABLET | Freq: Three times a day (TID) | ORAL | Status: DC
  Administered 2018-05-22 – 2018-05-26 (×13): 20 mg via ORAL
  Filled 2018-05-21 (×15): qty 1

## 2018-05-21 MED ORDER — SODIUM CHLORIDE 0.9 % IV SOLN
INTRAVENOUS | Status: DC
  Administered 2018-05-21: 13:00:00 via INTRAVENOUS

## 2018-05-21 MED ORDER — FUROSEMIDE 10 MG/ML IJ SOLN: 20.0000 mg | Freq: Once | INTRAMUSCULAR | Status: DC

## 2018-05-21 MED ORDER — LIDOCAINE HCL (PF) 1 % IJ SOLN
INTRAMUSCULAR | Status: DC | PRN
  Administered 2018-05-21 (×2): 2 mL via SUBCUTANEOUS

## 2018-05-21 MED ORDER — DIPHENHYDRAMINE HCL 25 MG PO CAPS
25.0000 mg | ORAL_CAPSULE | Freq: Once | ORAL | Status: DC
  Filled 2018-05-21: qty 1

## 2018-05-21 MED ORDER — METOPROLOL TARTRATE 5 MG/5ML IV SOLN
5.0000 mg | Freq: Four times a day (QID) | INTRAVENOUS | Status: DC
  Administered 2018-05-21 – 2018-05-24 (×10): 5 mg via INTRAVENOUS
  Filled 2018-05-21 (×11): qty 5

## 2018-05-21 MED ORDER — SODIUM CHLORIDE 0.9% FLUSH: 3.0000 mL | Freq: Two times a day (BID) | INTRAVENOUS | Status: DC

## 2018-05-21 MED ORDER — HEPARIN (PORCINE) IN NACL 1000-0.9 UT/500ML-% IV SOLN
INTRAVENOUS | Status: DC | PRN
  Administered 2018-05-21 (×2): 500 mL

## 2018-05-21 SURGICAL SUPPLY — 17 items
CATH 5FR JL3.5 JR4 ANG PIG MP (CATHETERS) ×1 IMPLANT
CATH BALLN WEDGE 5F 110CM (CATHETERS) ×1 IMPLANT
CATH LANGSTON DUAL LUM PIG 6FR (CATHETERS) ×1 IMPLANT
DEVICE RAD COMP TR BAND LRG (VASCULAR PRODUCTS) ×1 IMPLANT
GLIDESHEATH SLEND SS 6F .021 (SHEATH) ×1 IMPLANT
GUIDEWIRE ANGLED .035X150CM (WIRE) ×1 IMPLANT
GUIDEWIRE INQWIRE 1.5J.035X260 (WIRE) IMPLANT
HOVERMATT SINGLE USE (MISCELLANEOUS) ×1 IMPLANT
INQWIRE 1.5J .035X260CM (WIRE) ×2
KIT HEART LEFT (KITS) ×2 IMPLANT
PACK CARDIAC CATHETERIZATION (CUSTOM PROCEDURE TRAY) ×2 IMPLANT
SHEATH GLIDE SLENDER 4/5FR (SHEATH) ×1 IMPLANT
TRANSDUCER W/STOPCOCK (MISCELLANEOUS) ×3 IMPLANT
TUBING ART PRESS 72  MALE/FEM (TUBING) ×1
TUBING ART PRESS 72 MALE/FEM (TUBING) IMPLANT
TUBING CIL FLEX 10 FLL-RA (TUBING) ×2 IMPLANT
WIRE EMERALD ST .035X150CM (WIRE) ×1 IMPLANT

## 2018-05-21 NOTE — Consult Note (Signed)
Advanced Heart Failure Team Consult Note   Primary Physician: Glendale Chard, MD PCP-Cardiologist:  Quay Burow, MD Consulting MD: Irish Lack   Reason for Consultation: Severe PAH  HPI:    Patricia Mata is a 62 y/o woman with multiple medical problems whom we are asked to see by Dr. Irish Lack for severe pulmonary HTN.   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   She has been followed by Dr. Gwenlyn Found for "severe" AS and pulmonary HTN.   Admitted on 10/27 with AF with RVR and recurrent anemia. Developed respiratory failure and intubated. She was transfused and placed on IV amio. Subsequently developed AKI/ESRD and placed on CVVHD. Converted to NSR on amio  and weaned of the vent. Renal function now back to normal and off CVVHD.   Echo showed normal LVEF 65-70% with moderate AS and moderate MS and severe PH with RVSP 83mmHG.   Underwent cath today with normal coronaries.   LVEDP 20 RA 14 RV 110/15 PA  105/40 (62) PCWP 25 Ao 91% PA 53% FICK 6.8/4.3 PVR 5.4 WU  AV: Moderate AS mean 25 AVA 1.3 cm2 MV: Moderate MS mean 12 MVA 2.0  ABG 7.44/35/58/91%  Denies h/o OSA, CTD or previous DVT/PE. Was a smoker previously but quit main years ago. Lives home with husband and very sedentary. Struggles with ADLs. No syncope. Mild edema.      Review of Systems: [y] = yes, [ ]  = no   General: Weight gain [ ] ; Weight loss [ ] ; Anorexia [ ] ; Fatigue [ y]; Fever [ ] ; Chills [ ] ; Weakness [ y]  Cardiac: Chest pain/pressure [ ] ; Resting SOB [ ] ; Exertional SOB Blue.Reese ]; Orthopnea [ ] ; Pedal Edema [ y]; Palpitations [ ] ; Syncope [ ] ; Presyncope [ ] ; Paroxysmal nocturnal dyspnea[ ]   Pulmonary: Cough [ ] ; Wheezing[ ] ; Hemoptysis[ ] ; Sputum [ ] ; Snoring Blue.Reese ]  GI: Vomiting[ ] ; Dysphagia[ ] ; Melena[ ] ; Hematochezia [ ] ; Heartburn[ ] ; Abdominal pain [ ] ;  Constipation [ ] ; Diarrhea [ ] ; BRBPR [ ]   GU: Hematuria[ ] ; Dysuria [ ] ; Nocturia[ ]   Vascular: Pain in legs with walking [ ] ; Pain in feet with lying flat [ ] ; Non-healing sores [ ] ; Stroke [ ] ; TIA [ ] ; Slurred speech [ ] ;  Neuro: Headaches[ ] ; Vertigo[ ] ; Seizures[ ] ; Paresthesias[ ] ;Blurred vision [ ] ; Diplopia [ ] ; Vision changes [ ]   Ortho/Skin: Arthritis Blue.Reese ]; Joint pain Blue.Reese ]; Muscle pain [ ] ; Joint swelling [ ] ; Back Pain [ ] ; Rash [ ]   Psych: Depression[ ] ; Anxiety[ ]   Heme: Bleeding problems Blue.Reese ]; Clotting disorders [ ] ; Anemia Blue.Reese ]  Endocrine: Diabetes [ y]; Thyroid dysfunction[ ]   Home Medications Prior to Admission medications   Medication Sig Start Date End Date Taking? Authorizing Provider  atorvastatin (LIPITOR) 40 MG tablet Take 1 tablet (40 mg total) by mouth daily. 04/23/18  Yes [name], FNP  Azilsartan-Chlorthalidone (EDARBYCLOR) 40-12.5 MG TABS Take 1 tablet by mouth daily.    Yes [provider]  colchicine 0.6 MG tablet Take 0.6 mg by mouth 2 (two) times daily as needed (gout flare).  11/02/15  Yes [provider]  furosemide (LASIX) 20 MG tablet TAKE 1 TABLET BY MOUTH TWICE A DAY 03/19/18  Yes Shadad, Mathis Dad, MD  hydrALAZINE (APRESOLINE) 25 MG tablet Take 2 tablets (50 mg total) by mouth  3 (three) times daily. 02/19/18  Yes Lorretta Harp, MD  insulin glargine (LANTUS) 100 unit/mL SOPN Inject 0.14 mLs (14 Units total) into the skin daily after breakfast. Patient taking differently: Inject 10 Units into the skin daily after breakfast.  06/16/17  Yes Bonnielee Haff, MD  JANUMET XR 50-500 MG TB24 Take 1 tablet by mouth daily. With evening meal 02/07/18  Yes [provider]  latanoprost (XALATAN) 0.005 % ophthalmic solution Place 1 drop into both eyes at bedtime.    Yes [provider]  lidocaine-prilocaine (EMLA) cream Apply a quarter size of cream to port 1-2 hours prior to access. Cover with saran wrap. 04/17/17  Yes [name],  MD  megestrol (MEGACE) 400 MG/10ML suspension Take 10 mLs (400 mg total) by mouth 2 (two) times daily. 03/06/18  Yes [name], MD  metoprolol tartrate (LOPRESSOR) 50 MG tablet TAKE 1 TABLET BY MOUTH TWICE A DAY WITH FOOD 05/04/18  Yes [name], FNP  Multiple Vitamins-Minerals (CENTRUM SILVER 50+WOMEN) TABS Take 1 tablet by mouth daily.   Yes [provider]  prochlorperazine (COMPAZINE) 10 MG tablet Take 1 tablet (10 mg total) by mouth every 6 (six) hours as needed for nausea or vomiting. 04/28/17  Yes [name], MD  Insulin Pen Needle 29G X 5MM MISC Use as directed 06/16/17   Bonnielee Haff, MD    Past Medical History: Past Medical History:  Diagnosis Date  . Cancer ([place])   . Diabetes mellitus without complication ([place])   . Hypertension   . Macrocytic anemia     Past Surgical History: Past Surgical History:  Procedure Laterality Date  . COLON SURGERY    . IR FLUORO GUIDE PORT INSERTION RIGHT  04/02/2017  . IR US GUIDE VASC ACCESS RIGHT  04/02/2017    Family History: Family History  Problem Relation Age of Onset  . Hypertension Mother   . Heart attack Father   . Hypertension Sister   . Hypertension Brother   . Hypertension Sister   . Hypertension Sister   . Prostate cancer Brother   . HIV/AIDS Brother     Social History: Social History   Socioeconomic History  . Marital status: Married    Spouse name: Not on file  . Number of children: Not on file  . Years of education: Not on file  . Highest education level: Not on file  Occupational History  . Not on file  Social Needs  . Financial resource strain: Not on file  . Food insecurity:    Worry: Not on file    Inability: Not on file  . Transportation needs:    Medical: Not on file    Non-medical: Not on file  Tobacco Use  . Smoking status: Former Smoker    Types: Cigarettes    Last attempt to quit: 01/31/2001    Years since quitting: 17.3  . Smokeless tobacco: Former Network engineer  and Sexual Activity  . Alcohol use: Yes    Alcohol/week: 2.0 standard drinks    Types: 2 Shots of liquor per week  . Drug use: Never  . Sexual activity: Not on file  Lifestyle  . Physical activity:    Days per week: Not on file    Minutes per session: Not on file  . Stress: Not on file  Relationships  . Social connections:    Talks on phone: Not on file    Gets together: Not on file    Attends religious  service: Not on file    Active member of club or organization: Not on file    Attends meetings of clubs or organizations: Not on file    Relationship status: Not on file  Other Topics Concern  . Not on file  Social History Narrative  . Not on file    Allergies:  Allergies  Allergen Reactions  . Ancef [Cefazolin] Itching    Severe itching- after procedure, ancef was the antibiotic.-04/02/17  . Sulfa Antibiotics Rash and Other (See Comments)    Blisters, also    Objective:    Vital Signs:   Temp:  [98.4 F (36.9 C)-99.8 F (37.7 C)] 99.8 F (37.7 C) (11/07 2050) Pulse Rate:  [0-119] 86 (11/07 2026) Resp:  [12-28] 22 (11/07 2050) BP: (97-201)/(49-85) 104/67 (11/07 2050) SpO2:  [0 %-100 %] 96 % (11/07 2050) Weight:  [92 kg] 92 kg (11/07 0537) Last BM Date: 05/18/18  Weight change: Filed Weights   05/19/18 0444 05/20/18 0503 05/21/18 0537  Weight: 87 kg 94.2 kg 92 kg    Intake/Output:   Intake/Output Summary (Last 24 hours) at 05/21/2018 2154 Last data filed at 05/21/2018 2026 Gross per 24 hour  Intake 325 ml  Output 950 ml  Net -625 ml      Physical Exam    General:  Elderly. Chronically ill-appearing. No resp difficulty HEENT: normal Neck: supple. JVP to jaw. Carotids 2+ bilat; no bruits. No lymphadenopathy or thyromegaly appreciated. Cor: PMI nondisplaced. Regular rate & rhythm. 3/6 AS +RV lift Lungs: clear anteriorl Abdomen: obese soft, nontender, nondistended. No hepatosplenomegaly. No bruits or masses. Good bowel sounds. Extremities: no cyanosis,  clubbing, rash, tr edema. Left wrist swoolen Neuro: alert & orientedx3, cranial nerves grossly intact. moves all 4 extremities w/o difficulty. Affect pleasant   Telemetry   NSR 80s Personally reviewed  EKG    Atrial tach 104. No ST-T wave abnormalities.    Labs   Basic Metabolic Panel: Recent Labs  Lab 05/17/18 0345  05/18/18 0418  05/19/18 0336 05/19/18 1805 05/20/18 0709 05/20/18 1612 05/21/18 0354  NA 136   < > 138   < > 140 140 139 138 139  K 4.0   < > 3.9   < > 3.8 3.8 3.8 3.9 3.9  CL 105   < > 106   < > 104 104 105 103 106  CO2 26   < > 25   < > 25 26 26 24 22   GLUCOSE 154*   < > 191*   < > 90 189* 164* 100* 124*  BUN 35*   < > 50*   < > 53* 44* 39* 37* 32*  CREATININE 1.03*   < > 1.05*   < > 1.02* 0.94 0.90 0.94 0.86  CALCIUM 8.7*   < > 9.2   < > 8.5* 9.0 9.0 9.3 9.3  MG 2.0  --  1.6*  --  1.6*  --  1.7  --  2.0  PHOS 4.1   < > 5.9*   < > 6.0* 4.9* 4.6 4.2 4.5   < > = values in this interval not displayed.    Liver Function Tests: Recent Labs  Lab 05/16/18 1212  05/17/18 0345  05/18/18 0418  05/19/18 0336 05/19/18 1805 05/20/18 0709 05/20/18 1612 05/21/18 0354  AST 249*  --  192*  --  143*  --   --   --   --   --   --   ALT 151*  --  120*  --  101*  --   --   --   --   --   --   ALKPHOS 105  --  108  --  128*  --   --   --   --   --   --   BILITOT 5.7*  --  5.8*  --  7.3*  --   --   --   --   --   --   PROT 5.6*  --  5.4*  --  5.9*  --   --   --   --   --   --   ALBUMIN 2.2*   < > 2.1*   < > 2.2*   < > 2.2* 2.4* 2.3* 2.5* 2.3*   < > = values in this interval not displayed.   No results for input(s): LIPASE, AMYLASE in the last 168 hours. No results for input(s): AMMONIA in the last 168 hours.  CBC: Recent Labs  Lab 05/17/18 0345 05/18/18 0418 05/19/18 0336 05/20/18 0709 05/21/18 1000  WBC 6.0 7.2 5.5 5.8 6.8  NEUTROABS  --   --  3.5  --   --   HGB 8.2* 9.0* 8.3* 8.1* 7.5*  HCT 24.5* 26.3* 25.4* 24.6* 22.7*  MCV 87.8 86.8 90.7 89.1 89.0    PLT 98* 114* 122* 143* 165    Cardiac Enzymes: No results for input(s): CKTOTAL, CKMB, CKMBINDEX, TROPONINI in the last 168 hours.  BNP: BNP (last 3 results) Recent Labs    05/11/18 0650 05/13/18 0333  BNP 2,681.1* 3,571.3*    ProBNP (last 3 results) No results for input(s): PROBNP in the last 8760 hours.   CBG: Recent Labs  Lab 05/21/18 0533 05/21/18 0804 05/21/18 1143 05/21/18 1655 05/21/18 2017  GLUCAP 115* 105* 102* 114* 125*    Coagulation Studies: No results for input(s): LABPROT, INR in the last 72 hours.   Imaging   Dg Forearm Left  Result Date: 05/20/2018 CLINICAL DATA:  Forearm pain after fall EXAM: LEFT FOREARM - 2 VIEW COMPARISON:  None. FINDINGS: No fracture or malalignment.  Vascular calcifications. IMPRESSION: No acute osseous abnormality Electronically Signed   By: Donavan Foil M.D.   On: 05/20/2018 23:54   Dg Ankle Complete Left  Result Date: 05/21/2018 CLINICAL DATA:  Anterior left ankle pain and left foot pain post fall. EXAM: LEFT ANKLE COMPLETE - 3+ VIEW COMPARISON:  None. FINDINGS: There is no evidence of fracture, dislocation, or joint effusion. There is no evidence of arthropathy or other focal bone abnormality. Mild ankle swelling, predominantly laterally. Vascular and soft tissue calcifications noted. IMPRESSION: No acute fracture or dislocation identified about the left ankle. Electronically Signed   By: Fidela Salisbury M.D.   On: 05/21/2018 10:51   Ct Head Wo Contrast  Result Date: 05/21/2018 CLINICAL DATA:  Head trauma and ataxia EXAM: CT HEAD WITHOUT CONTRAST TECHNIQUE: Contiguous axial images were obtained from the base of the skull through the vertex without intravenous contrast. COMPARISON:  None. FINDINGS: Brain: There is no mass, hemorrhage or extra-axial collection. The size and configuration of the ventricles and extra-axial CSF spaces are normal. There is hypoattenuation of the periventricular white matter, most commonly  indicating chronic ischemic microangiopathy. Vascular: Atherosclerotic calcification of the vertebral and internal carotid arteries at the skull base. No abnormal hyperdensity of the major intracranial arteries or dural venous sinuses. Skull: Moderate right maxillary mucosal thickening. Complete opacification of the right frontal sinus. Sinuses/Orbits: No fluid levels or advanced  mucosal thickening of the visualized paranasal sinuses. No mastoid or middle ear effusion. The orbits are normal. IMPRESSION: 1. Chronic small vessel disease without acute intracranial abnormality. 2. Internal carotid artery and vertebral artery atherosclerosis at the skull base. 3. Chronic right frontal and maxillary sinusitis. Electronically Signed   By: Ulyses Jarred M.D.   On: 05/21/2018 00:06   Dg Hand 2 View Left  Result Date: 05/20/2018 CLINICAL DATA:  Hand pain after fall EXAM: LEFT HAND - 2 VIEW COMPARISON:  None. FINDINGS: No subluxation. Possible nondisplaced fracture radial base of the third distal phalanx. No radiopaque foreign body in the soft tissues. IMPRESSION: Possible nondisplaced fracture at the base of the third distal phalanx, correlate clinically for focal tenderness to the region Electronically Signed   By: Donavan Foil M.D.   On: 05/20/2018 23:57   Dg Humerus Left  Result Date: 05/20/2018 CLINICAL DATA:  Fall EXAM: LEFT HUMERUS - 2+ VIEW COMPARISON:  None. FINDINGS: No fracture or malalignment.  Soft tissues are unremarkable. IMPRESSION: No acute osseous abnormality. Electronically Signed   By: Donavan Foil M.D.   On: 05/20/2018 23:59   Dg Foot Complete Left  Result Date: 05/21/2018 CLINICAL DATA:  Anterior left ankle pain and left foot pain post fall. EXAM: LEFT FOOT - COMPLETE 3+ VIEW COMPARISON:  None. FINDINGS: There is no evidence of fracture or dislocation. There is no evidence of arthropathy or other focal bone abnormality. Soft tissue swelling of the dorsum of the mid and forefoot. Vascular  calcifications noted. IMPRESSION: No acute fracture or dislocation identified about the left foot. Dorsal soft tissue swelling noted. Electronically Signed   By: Fidela Salisbury M.D.   On: 05/21/2018 10:54   [place] Ekg Site Rite  Result Date: 05/21/2018 If Site Rite image not attached, placement could not be confirmed due to current cardiac rhythm.     Medications:     Current Medications: . sodium chloride   Intravenous Once  . sodium chloride   Intravenous Once  . sodium chloride   Intravenous Once  . sodium chloride   Intravenous Once  . Chlorhexidine Gluconate Cloth  6 each Topical Daily  . diphenhydrAMINE  25 mg Oral Once  . feeding supplement (GLUCERNA SHAKE)  237 mL Oral BID BM  . furosemide  20 mg Intravenous Once  . furosemide  20 mg Intravenous Once  . Gerhardt's butt cream   Topical BID  . insulin glargine  10 Units Subcutaneous Daily  . latanoprost  1 drop Both Eyes QHS  . mouth rinse  15 mL Mouth Rinse BID  . metoprolol tartrate  5 mg Intravenous Q6H  . pantoprazole  40 mg Oral Daily  . sodium chloride flush  10-40 mL Intracatheter Q12H  . sodium chloride flush  3 mL Intravenous Q12H     Infusions: . sodium chloride 10 mL/hr at 05/18/18 0800  . sodium chloride    . amiodarone 30 mg/hr (05/21/18 1009)       Patient Profile   62 y/o woman with multiple medical problems including severe PAH, moderate AS, transfusion dependent anemia admitted with shock in setting of new-onset AF. Course c/b VDRF and renal failure requiring temporary CVVHD  Assessment/Plan   1. PAH - echo and cath numbers reviewed personally - this is severe and longstanding. NYHAI IIIb-IV - likely multifactorial in nature suspect mostly WHO group II (diastolic HF and valvular disease) and III (OSA) but given degree of PH cannot exclude WHO Group I component. PVR 5.4 WU -  likely end-stage at this point. Not candidate for valve intervention. Volume status about as good as we are going to  get it.  - will check VQ and will need outpatient sleep study (though pCO2 much lower than I expected). PFTs and assess need for home O2 - very careful trial of sildenafil 20 tid  2. Moderate AS/MS - not candidate for intervention  3. PAF - now in NSR on amio. Did not tolerate AF well at all. Continue Amio. - will need Eliquis  4. AKI - resolved  5. Transfusion dependent anemia.  - transfuse as needed.  I suspect she truly is endstage and I doubt we will have much to offer her to improve her situation and QOL. Will proceed as above but would strongly consider Palliative Care involvement.     Length of Stay: [place], MD  05/21/2018, 9:54 PM  Advanced Heart Failure Team Pager [phone] (M-F; 7a - 4p)  Please contact [facility] for night-coverage after hours (4p -7a ) and weekends on [email or web address]

## 2018-05-21 NOTE — Progress Notes (Signed)
Reviewed CBC results w/ Dr Christ Kick. T&C, transfuse 1 u PRBCs.  Xray results reviewed, no fx.  Rosaria Ferries, PA-C 05/21/2018 12:01 PM Beeper [phone]

## 2018-05-21 NOTE — Interval H&P Note (Signed)
History and Physical Interval Note:  05/21/2018 1:28 PM  Patricia Mata  has presented today for surgery, with the diagnosis of chest pain, aortic stenosis, pulmonary hypertension. The various methods of treatment have been discussed with the patient and family. After consideration of risks, benefits and other options for treatment, the patient has consented to  Procedure(s): RIGHT/LEFT HEART CATH AND CORONARY ANGIOGRAPHY (N/A) as a surgical intervention .  The patient's history has been reviewed, patient examined, no change in status, stable for surgery.  I have reviewed the patient's chart and labs.  Questions were answered to the patient's satisfaction.    Cath Lab Visit (complete for each Cath Lab visit)  Clinical Evaluation Leading to the Procedure:   ACS: No.  Non-ACS:    Anginal Classification: CCS IV  Anti-ischemic medical therapy: No Therapy  Non-Invasive Test Results: No non-invasive testing performed  Prior CABG: No previous CABG  [name]

## 2018-05-21 NOTE — Progress Notes (Addendum)
Progress Note  Patient Name: Hadiyah Maricle Date of Encounter: 05/21/2018  Primary Cardiologist: Quay Burow, MD   Subjective   L foot and ankle pain after fall last pm. Breathing ok, no chest pain. R arm is sore as well. Knot on top of L foot has been there for a long time.   Inpatient Medications    Scheduled Meds: . sodium chloride   Intravenous Once  . sodium chloride   Intravenous Once  . sodium chloride   Intravenous Once  . Chlorhexidine Gluconate Cloth  6 each Topical Daily  . feeding supplement (GLUCERNA SHAKE)  237 mL Oral BID BM  . Gerhardt's butt cream   Topical BID  . insulin glargine  10 Units Subcutaneous Daily  . latanoprost  1 drop Both Eyes QHS  . mouth rinse  15 mL Mouth Rinse BID  . pantoprazole  40 mg Oral Daily  . sodium chloride flush  10-40 mL Intracatheter Q12H   Continuous Infusions: . sodium chloride 10 mL/hr at 05/18/18 0800  . amiodarone 30 mg/hr (05/20/18 1914)   PRN Meds: sodium chloride, acetaminophen, hydrALAZINE, levalbuterol, metoprolol tartrate, ondansetron (ZOFRAN) IV, sodium chloride flush   Vital Signs    Vitals:   05/21/18 0053 05/21/18 0100 05/21/18 0537 05/21/18 0807  BP:   (!) 172/70 (!) 99/49  Pulse:  (!) 102 (!) 102 94  Resp:  (!) 23 (!) 22 (!) 21  Temp: 98.6 F (37 C)  98.4 F (36.9 C) 98.6 F (37 C)  TempSrc: Oral  Oral Oral  SpO2:  94% 95% 94%  Weight:   92 kg   Height:        Intake/Output Summary (Last 24 hours) at 05/21/2018 4665 Last data filed at 05/21/2018 0700 Gross per 24 hour  Intake 20 ml  Output 351 ml  Net -331 ml   Filed Weights   05/19/18 0444 05/20/18 0503 05/21/18 0537  Weight: 87 kg 94.2 kg 92 kg    Telemetry    SR/ST and atrial fib w/ infrequent PVCs- Personally Reviewed  ECG    None today - Personally Reviewed  Physical Exam   General: Well developed, well nourished, female in no acute distress Head: Eyes PERRLA, No xanthomas.   Normocephalic and atraumatic Lungs: few rales  bases Heart: slightly irreg R&R S1 S2, without RG. 2-3/6 sem,  Pulses are 2+ & equal. No JVD. Abdomen: Bowel sounds are present, abdomen soft and non-tender without masses or  hernias noted. Msk: Very weak but equal strength. Extremities: No clubbing, cyanosis or edema. Mild edema L hand, L ankle Skin:  No rashes or lesions noted. Neuro: Alert and oriented X 3. Psych:  Good affect, responds appropriately   Labs    Chemistry Recent Labs  Lab 05/16/18 1212  05/17/18 0345  05/18/18 0418  05/20/18 0709 05/20/18 1612 05/21/18 0354  NA  --    < > 136   < > 138   < > 139 138 139  K  --    < > 4.0   < > 3.9   < > 3.8 3.9 3.9  CL  --    < > 105   < > 106   < > 105 103 106  CO2  --    < > 26   < > 25   < > 26 24 22   GLUCOSE  --    < > 154*   < > 191*   < > 164* 100* 124*  BUN  --    < > 35*   < > 50*   < > 39* 37* 32*  CREATININE  --    < > 1.03*   < > 1.05*   < > 0.90 0.94 0.86  CALCIUM  --    < > 8.7*   < > 9.2   < > 9.0 9.3 9.3  PROT 5.6*  --  5.4*  --  5.9*  --   --   --   --   ALBUMIN 2.2*   < > 2.1*   < > 2.2*   < > 2.3* 2.5* 2.3*  AST 249*  --  192*  --  143*  --   --   --   --   ALT 151*  --  120*  --  101*  --   --   --   --   ALKPHOS 105  --  108  --  128*  --   --   --   --   BILITOT 5.7*  --  5.8*  --  7.3*  --   --   --   --   GFRNONAA  --    < > 57*   < > 55*   < > >60 >60 >60  GFRAA  --    < > >60   < > >60   < > >60 >60 >60  ANIONGAP  --    < > 5   < > 7   < > 8 11 11    < > = values in this interval not displayed.     Hematology Recent Labs  Lab 05/18/18 0418 05/19/18 0336 05/20/18 0709  WBC 7.2 5.5 5.8  RBC 3.03* 2.80* 2.76*  HGB 9.0* 8.3* 8.1*  HCT 26.3* 25.4* 24.6*  MCV 86.8 90.7 89.1  MCH 29.7 29.6 29.3  MCHC 34.2 32.7 32.9  RDW 17.7* 18.2* 18.5*  PLT 114* 122* 143*    Cardiac Enzymes Recent Labs  Lab 05/14/18 1431 05/14/18 2037  TROPONINI 0.15* 0.13*   No results for input(s): TROPIPOC in the last 168 hours.   BNP No results for input(s):  BNP, PROBNP in the last 168 hours.   Radiology    Dg Forearm Left  Result Date: 05/20/2018 CLINICAL DATA:  Forearm pain after fall EXAM: LEFT FOREARM - 2 VIEW COMPARISON:  None. FINDINGS: No fracture or malalignment.  Vascular calcifications. IMPRESSION: No acute osseous abnormality Electronically Signed   By: Donavan Foil M.D.   On: 05/20/2018 23:54   Ct Head Wo Contrast  Result Date: 05/21/2018 CLINICAL DATA:  Head trauma and ataxia EXAM: CT HEAD WITHOUT CONTRAST TECHNIQUE: Contiguous axial images were obtained from the base of the skull through the vertex without intravenous contrast. COMPARISON:  None. FINDINGS: Brain: There is no mass, hemorrhage or extra-axial collection. The size and configuration of the ventricles and extra-axial CSF spaces are normal. There is hypoattenuation of the periventricular white matter, most commonly indicating chronic ischemic microangiopathy. Vascular: Atherosclerotic calcification of the vertebral and internal carotid arteries at the skull base. No abnormal hyperdensity of the major intracranial arteries or dural venous sinuses. Skull: Moderate right maxillary mucosal thickening. Complete opacification of the right frontal sinus. Sinuses/Orbits: No fluid levels or advanced mucosal thickening of the visualized paranasal sinuses. No mastoid or middle ear effusion. The orbits are normal. IMPRESSION: 1. Chronic small vessel disease without acute intracranial abnormality. 2. Internal carotid artery and vertebral artery atherosclerosis at the skull  base. 3. Chronic right frontal and maxillary sinusitis. Electronically Signed   By: Ulyses Jarred M.D.   On: 05/21/2018 00:06   Dg Hand 2 View Left  Result Date: 05/20/2018 CLINICAL DATA:  Hand pain after fall EXAM: LEFT HAND - 2 VIEW COMPARISON:  None. FINDINGS: No subluxation. Possible nondisplaced fracture radial base of the third distal phalanx. No radiopaque foreign body in the soft tissues. IMPRESSION: Possible  nondisplaced fracture at the base of the third distal phalanx, correlate clinically for focal tenderness to the region Electronically Signed   By: Donavan Foil M.D.   On: 05/20/2018 23:57   Dg Humerus Left  Result Date: 05/20/2018 CLINICAL DATA:  Fall EXAM: LEFT HUMERUS - 2+ VIEW COMPARISON:  None. FINDINGS: No fracture or malalignment.  Soft tissues are unremarkable. IMPRESSION: No acute osseous abnormality. Electronically Signed   By: Donavan Foil M.D.   On: 05/20/2018 23:59    Cardiac Studies   ECHO 05/12/2017 - Left ventricle: The cavity size was normal. There was moderate concentric hypertrophy. Systolic function was vigorous. The estimated ejection fraction was in the range of 65% to 70%. Wall motion was normal; there were no regional wall motion abnormalities. Features are consistent with a pseudonormal left ventricular filling pattern, with concomitant abnormal relaxation and increased filling pressure (grade 2 diastolic dysfunction). Doppler parameters are consistent with high ventricular filling pressure. - Aortic valve: Trileaflet; moderately thickened, moderately calcified leaflets. Valve mobility was restricted. There was moderate stenosis. Mean gradient (S): 22 mm Hg. VTI ratio of LVOT to aortic valve: 0.37. Valve area (VTI): 1.28 cm^2. Valve area (Vmax): 1.01 cm^2. Valve area (Vmean): 1.18 cm^2. - Mitral valve: Calcified annulus. Moderate diffuse thickening, calcification, and elongation. The findings are consistent with mild to moderate stenosis. Deceleration time: 324 ms. - Tricuspid valve: There was moderate regurgitation. - Pulmonic valve: There was trivial regurgitation. - Pulmonary arteries: PA peak pressure: 93 mm Hg (S).  Impressions:  - The right ventricular systolic pressure was increased consistent with severe pulmonary hypertension.  Patient Profile     62 y.o. female with AFib, aortic stenosis, PAH, was admitted 10/28  for shock and respiratory failure triggered by sepsis on background of severe aortic stenosis and severe pulmonary artery hypertension, complicated by paroxysmalatrial fibrillation, QT prolongation and oligoanuric renal faulure, anemia, cards following for Afib RVR.   Assessment & Plan    1) AFib:  - now in out of Afib - on IV amio, heparin off for cath and due to bleeding after a fall - currently not getting any PO meds - since tachycardic most of the time, will add metoprolol 5 mg IV q 6 hr  2) COPD:  - no wheezing - continue current rx  3) Anemia:  - hx myelodysplastic syndrome - recheck now, precath - some ecchymosis R arm, may be a little lower than yesterday  4) troponin elevation and ECG changes in setting of sepsis/major hemodynamic changes:  - EF nl  - but ECG changes and trop elevation concerning for CAD - R/L cath today  5) acute renal failure:  - renal function normalized    6) severe AS:  - see echo results above - do R/L heart cath today  7) Fall - multiple tender areas, but has some swelling and tenderness L foot and ankle - see Xray reports above, no fx - will also X ray L foot and ankle   For questions or updates, please contact [facility] Please consult [email or web address] for contact info under  Signed, Rosaria Ferries, PA-C  05/21/2018, 9:07 AM    I have examined the patient and reviewed assessment and plan and discussed with patient.  Agree with above as stated.  Had a fall yesterday.  Xray left wrist and ankle.  Plan for left and right heart cath today for AS and pulmonary HTN.  OK for liquids until noon.  Larae Grooms

## 2018-05-21 NOTE — H&P (View-Only) (Signed)
Progress Note  Patient Name: Patricia Mata Date of Encounter: 05/21/2018  Primary Cardiologist: Quay Burow, MD   Subjective   L foot and ankle pain after fall last pm. Breathing ok, no chest pain. R arm is sore as well. Knot on top of L foot has been there for a long time.   Inpatient Medications    Scheduled Meds: . sodium chloride   Intravenous Once  . sodium chloride   Intravenous Once  . sodium chloride   Intravenous Once  . Chlorhexidine Gluconate Cloth  6 each Topical Daily  . feeding supplement (GLUCERNA SHAKE)  237 mL Oral BID BM  . Gerhardt's butt cream   Topical BID  . insulin glargine  10 Units Subcutaneous Daily  . latanoprost  1 drop Both Eyes QHS  . mouth rinse  15 mL Mouth Rinse BID  . pantoprazole  40 mg Oral Daily  . sodium chloride flush  10-40 mL Intracatheter Q12H   Continuous Infusions: . sodium chloride 10 mL/hr at 05/18/18 0800  . amiodarone 30 mg/hr (05/20/18 1914)   PRN Meds: sodium chloride, acetaminophen, hydrALAZINE, levalbuterol, metoprolol tartrate, ondansetron (ZOFRAN) IV, sodium chloride flush   Vital Signs    Vitals:   05/21/18 0053 05/21/18 0100 05/21/18 0537 05/21/18 0807  BP:   (!) 172/70 (!) 99/49  Pulse:  (!) 102 (!) 102 94  Resp:  (!) 23 (!) 22 (!) 21  Temp: 98.6 F (37 C)  98.4 F (36.9 C) 98.6 F (37 C)  TempSrc: Oral  Oral Oral  SpO2:  94% 95% 94%  Weight:   92 kg   Height:        Intake/Output Summary (Last 24 hours) at 05/21/2018 4098 Last data filed at 05/21/2018 0700 Gross per 24 hour  Intake 20 ml  Output 351 ml  Net -331 ml   Filed Weights   05/19/18 0444 05/20/18 0503 05/21/18 0537  Weight: 87 kg 94.2 kg 92 kg    Telemetry    SR/ST and atrial fib w/ infrequent PVCs- Personally Reviewed  ECG    None today - Personally Reviewed  Physical Exam   General: Well developed, well nourished, female in no acute distress Head: Eyes PERRLA, No xanthomas.   Normocephalic and atraumatic Lungs: few rales  bases Heart: slightly irreg R&R S1 S2, without RG. 2-3/6 sem,  Pulses are 2+ & equal. No JVD. Abdomen: Bowel sounds are present, abdomen soft and non-tender without masses or  hernias noted. Msk: Very weak but equal strength. Extremities: No clubbing, cyanosis or edema. Mild edema L hand, L ankle Skin:  No rashes or lesions noted. Neuro: Alert and oriented X 3. Psych:  Good affect, responds appropriately   Labs    Chemistry Recent Labs  Lab 05/16/18 1212  05/17/18 0345  05/18/18 0418  05/20/18 0709 05/20/18 1612 05/21/18 0354  NA  --    < > 136   < > 138   < > 139 138 139  K  --    < > 4.0   < > 3.9   < > 3.8 3.9 3.9  CL  --    < > 105   < > 106   < > 105 103 106  CO2  --    < > 26   < > 25   < > 26 24 22   GLUCOSE  --    < > 154*   < > 191*   < > 164* 100* 124*  BUN  --    < > 35*   < > 50*   < > 39* 37* 32*  CREATININE  --    < > 1.03*   < > 1.05*   < > 0.90 0.94 0.86  CALCIUM  --    < > 8.7*   < > 9.2   < > 9.0 9.3 9.3  PROT 5.6*  --  5.4*  --  5.9*  --   --   --   --   ALBUMIN 2.2*   < > 2.1*   < > 2.2*   < > 2.3* 2.5* 2.3*  AST 249*  --  192*  --  143*  --   --   --   --   ALT 151*  --  120*  --  101*  --   --   --   --   ALKPHOS 105  --  108  --  128*  --   --   --   --   BILITOT 5.7*  --  5.8*  --  7.3*  --   --   --   --   GFRNONAA  --    < > 57*   < > 55*   < > >60 >60 >60  GFRAA  --    < > >60   < > >60   < > >60 >60 >60  ANIONGAP  --    < > 5   < > 7   < > 8 11 11    < > = values in this interval not displayed.     Hematology Recent Labs  Lab 05/18/18 0418 05/19/18 0336 05/20/18 0709  WBC 7.2 5.5 5.8  RBC 3.03* 2.80* 2.76*  HGB 9.0* 8.3* 8.1*  HCT 26.3* 25.4* 24.6*  MCV 86.8 90.7 89.1  MCH 29.7 29.6 29.3  MCHC 34.2 32.7 32.9  RDW 17.7* 18.2* 18.5*  PLT 114* 122* 143*    Cardiac Enzymes Recent Labs  Lab 05/14/18 1431 05/14/18 2037  TROPONINI 0.15* 0.13*   No results for input(s): TROPIPOC in the last 168 hours.   BNP No results for input(s):  BNP, PROBNP in the last 168 hours.   Radiology    Dg Forearm Left  Result Date: 05/20/2018 CLINICAL DATA:  Forearm pain after fall EXAM: LEFT FOREARM - 2 VIEW COMPARISON:  None. FINDINGS: No fracture or malalignment.  Vascular calcifications. IMPRESSION: No acute osseous abnormality Electronically Signed   By: Donavan Foil M.D.   On: 05/20/2018 23:54   Ct Head Wo Contrast  Result Date: 05/21/2018 CLINICAL DATA:  Head trauma and ataxia EXAM: CT HEAD WITHOUT CONTRAST TECHNIQUE: Contiguous axial images were obtained from the base of the skull through the vertex without intravenous contrast. COMPARISON:  None. FINDINGS: Brain: There is no mass, hemorrhage or extra-axial collection. The size and configuration of the ventricles and extra-axial CSF spaces are normal. There is hypoattenuation of the periventricular white matter, most commonly indicating chronic ischemic microangiopathy. Vascular: Atherosclerotic calcification of the vertebral and internal carotid arteries at the skull base. No abnormal hyperdensity of the major intracranial arteries or dural venous sinuses. Skull: Moderate right maxillary mucosal thickening. Complete opacification of the right frontal sinus. Sinuses/Orbits: No fluid levels or advanced mucosal thickening of the visualized paranasal sinuses. No mastoid or middle ear effusion. The orbits are normal. IMPRESSION: 1. Chronic small vessel disease without acute intracranial abnormality. 2. Internal carotid artery and vertebral artery atherosclerosis at the skull  base. 3. Chronic right frontal and maxillary sinusitis. Electronically Signed   By: Ulyses Jarred M.D.   On: 05/21/2018 00:06   Dg Hand 2 View Left  Result Date: 05/20/2018 CLINICAL DATA:  Hand pain after fall EXAM: LEFT HAND - 2 VIEW COMPARISON:  None. FINDINGS: No subluxation. Possible nondisplaced fracture radial base of the third distal phalanx. No radiopaque foreign body in the soft tissues. IMPRESSION: Possible  nondisplaced fracture at the base of the third distal phalanx, correlate clinically for focal tenderness to the region Electronically Signed   By: Donavan Foil M.D.   On: 05/20/2018 23:57   Dg Humerus Left  Result Date: 05/20/2018 CLINICAL DATA:  Fall EXAM: LEFT HUMERUS - 2+ VIEW COMPARISON:  None. FINDINGS: No fracture or malalignment.  Soft tissues are unremarkable. IMPRESSION: No acute osseous abnormality. Electronically Signed   By: Donavan Foil M.D.   On: 05/20/2018 23:59    Cardiac Studies   ECHO 05/12/2017 - Left ventricle: The cavity size was normal. There was moderate concentric hypertrophy. Systolic function was vigorous. The estimated ejection fraction was in the range of 65% to 70%. Wall motion was normal; there were no regional wall motion abnormalities. Features are consistent with a pseudonormal left ventricular filling pattern, with concomitant abnormal relaxation and increased filling pressure (grade 2 diastolic dysfunction). Doppler parameters are consistent with high ventricular filling pressure. - Aortic valve: Trileaflet; moderately thickened, moderately calcified leaflets. Valve mobility was restricted. There was moderate stenosis. Mean gradient (S): 22 mm Hg. VTI ratio of LVOT to aortic valve: 0.37. Valve area (VTI): 1.28 cm^2. Valve area (Vmax): 1.01 cm^2. Valve area (Vmean): 1.18 cm^2. - Mitral valve: Calcified annulus. Moderate diffuse thickening, calcification, and elongation. The findings are consistent with mild to moderate stenosis. Deceleration time: 324 ms. - Tricuspid valve: There was moderate regurgitation. - Pulmonic valve: There was trivial regurgitation. - Pulmonary arteries: PA peak pressure: 93 mm Hg (S).  Impressions:  - The right ventricular systolic pressure was increased consistent with severe pulmonary hypertension.  Patient Profile     62 y.o. female with AFib, aortic stenosis, PAH, was admitted 10/28  for shock and respiratory failure triggered by sepsis on background of severe aortic stenosis and severe pulmonary artery hypertension, complicated by paroxysmalatrial fibrillation, QT prolongation and oligoanuric renal faulure, anemia, cards following for Afib RVR.   Assessment & Plan    1) AFib:  - now in out of Afib - on IV amio, heparin off for cath and due to bleeding after a fall - currently not getting any PO meds - since tachycardic most of the time, will add metoprolol 5 mg IV q 6 hr  2) COPD:  - no wheezing - continue current rx  3) Anemia:  - hx myelodysplastic syndrome - recheck now, precath - some ecchymosis R arm, may be a little lower than yesterday  4) troponin elevation and ECG changes in setting of sepsis/major hemodynamic changes:  - EF nl  - but ECG changes and trop elevation concerning for CAD - R/L cath today  5) acute renal failure:  - renal function normalized    6) severe AS:  - see echo results above - do R/L heart cath today  7) Fall - multiple tender areas, but has some swelling and tenderness L foot and ankle - see Xray reports above, no fx - will also X ray L foot and ankle   For questions or updates, please contact [facility] Please consult [email or web address] for contact info under  Signed, Rosaria Ferries, PA-C  05/21/2018, 9:07 AM    I have examined the patient and reviewed assessment and plan and discussed with patient.  Agree with above as stated.  Had a fall yesterday.  Xray left wrist and ankle.  Plan for left and right heart cath today for AS and pulmonary HTN.  OK for liquids until noon.  Larae Grooms

## 2018-05-21 NOTE — Brief Op Note (Signed)
BRIEF CARDIAC CATHETERIZATION NOTE  DATE: 05/21/2018 TIME: 2:38 PM  PATIENT:  Patricia Mata  62 y.o. female  PRE-OPERATIVE DIAGNOSIS:  Chest pain, aortic stenosis  POST-OPERATIVE DIAGNOSIS:  Severe pulmonary hypertension, moderate aortic and mitral stenosis  PROCEDURE:  Procedure(s): RIGHT/LEFT HEART CATH AND CORONARY ANGIOGRAPHY (N/A)  SURGEON:  Surgeon(s) and Role:    * Dewey Neukam, MD - Primary  FINDINGS: 1. No significant coronary artery disease. 2. Moderately elevated left and right heart filling pressures. 3. Severe pulmonary hypertension. 4. Moderate aortic and mitral valve stenosis. 5. Normal to high Fick cardiac output/index.  RECOMMENDATIONS: 1. Consider restarting diuresis tomorrow, as renal function tolerates following contrast administration today. 2. Consider advanced heart failure consultation to assist with management of HFpEF and severe pulmonary hypertension.  I do not believe that severe valvular disease is the primary etiology of the patient's symptoms.  Nelva Bush, MD [facility] Pager: [phone]

## 2018-05-21 NOTE — Progress Notes (Signed)
IV consult to place Midline.  Patient currently has 2 PIV in place that are patent and flush well.  RN stated MD wanted Midline for lab draws.  IV RN educated patient RN that Midlines do not always give blood return due to not being placed like a PICC or CVC.  Patient RN verbalized understanding and would follow up with MD.  Midline order to be discontinued.

## 2018-05-22 ENCOUNTER — Inpatient Hospital Stay (HOSPITAL_COMMUNITY): Payer: BC Managed Care – PPO

## 2018-05-22 ENCOUNTER — Inpatient Hospital Stay: Payer: BC Managed Care – PPO | Attending: Oncology

## 2018-05-22 ENCOUNTER — Inpatient Hospital Stay: Payer: BC Managed Care – PPO

## 2018-05-22 ENCOUNTER — Encounter (HOSPITAL_COMMUNITY): Payer: Self-pay | Admitting: Internal Medicine

## 2018-05-22 DIAGNOSIS — Z9221 Personal history of antineoplastic chemotherapy: Secondary | ICD-10-CM | POA: Insufficient documentation

## 2018-05-22 DIAGNOSIS — D469 Myelodysplastic syndrome, unspecified: Secondary | ICD-10-CM | POA: Insufficient documentation

## 2018-05-22 DIAGNOSIS — I272 Pulmonary hypertension, unspecified: Secondary | ICD-10-CM | POA: Insufficient documentation

## 2018-05-22 DIAGNOSIS — L8990 Pressure ulcer of unspecified site, unspecified stage: Secondary | ICD-10-CM

## 2018-05-22 DIAGNOSIS — K5900 Constipation, unspecified: Secondary | ICD-10-CM | POA: Insufficient documentation

## 2018-05-22 DIAGNOSIS — Z79899 Other long term (current) drug therapy: Secondary | ICD-10-CM | POA: Insufficient documentation

## 2018-05-22 DIAGNOSIS — Z9049 Acquired absence of other specified parts of digestive tract: Secondary | ICD-10-CM | POA: Insufficient documentation

## 2018-05-22 DIAGNOSIS — Z85038 Personal history of other malignant neoplasm of large intestine: Secondary | ICD-10-CM | POA: Insufficient documentation

## 2018-05-22 DIAGNOSIS — I4891 Unspecified atrial fibrillation: Secondary | ICD-10-CM | POA: Insufficient documentation

## 2018-05-22 LAB — COMPREHENSIVE METABOLIC PANEL
ALT: 42 U/L (ref 0–44)
AST: 42 U/L — ABNORMAL HIGH (ref 15–41)
Albumin: 2.1 g/dL — ABNORMAL LOW (ref 3.5–5.0)
Alkaline Phosphatase: 106 U/L (ref 38–126)
Anion gap: 7 (ref 5–15)
BUN: 27 mg/dL — ABNORMAL HIGH (ref 8–23)
CO2: 24 mmol/L (ref 22–32)
Calcium: 8.9 mg/dL (ref 8.9–10.3)
Chloride: 108 mmol/L (ref 98–111)
Creatinine, Ser: 0.83 mg/dL (ref 0.44–1.00)
GFR calc Af Amer: >60 mL/min (ref >60)
GFR calc non Af Amer: >60 mL/min (ref >60)
Glucose, Bld: 117 mg/dL — ABNORMAL HIGH (ref 70–99)
Potassium: 3.6 mmol/L (ref 3.5–5.1)
Sodium: 139 mmol/L (ref 135–145)
Total Bilirubin: 4.1 mg/dL — ABNORMAL HIGH (ref 0.3–1.2)
Total Protein: 6 g/dL — ABNORMAL LOW (ref 6.5–8.1)

## 2018-05-22 LAB — TYPE AND SCREEN
ABO/RH(D): A NEG
Antibody Screen: NEGATIVE
Unit division: 0

## 2018-05-22 LAB — CBC
HCT: 23.7 % — ABNORMAL LOW (ref 36.0–46.0)
Hemoglobin: 7.8 g/dL — ABNORMAL LOW (ref 12.0–15.0)
MCH: 29.5 pg (ref 26.0–34.0)
MCHC: 32.9 g/dL (ref 30.0–36.0)
MCV: 89.8 fL (ref 80.0–100.0)
Platelets: 177 10*3/uL (ref 150–400)
RBC: 2.64 MIL/uL — ABNORMAL LOW (ref 3.87–5.11)
RDW: 17.6 % — ABNORMAL HIGH (ref 11.5–15.5)
WBC: 7 10*3/uL (ref 4.0–10.5)
nRBC: 0 % (ref 0.0–0.2)

## 2018-05-22 LAB — MAGNESIUM: Magnesium: 1.7 mg/dL (ref 1.7–2.4)

## 2018-05-22 LAB — BPAM RBC
Blood Product Expiration Date: 201912032359
ISSUE DATE / TIME: 201911072017
Unit Type and Rh: 600

## 2018-05-22 LAB — GLUCOSE, CAPILLARY
Glucose-Capillary: 110 mg/dL — ABNORMAL HIGH (ref 70–99)
Glucose-Capillary: 147 mg/dL — ABNORMAL HIGH (ref 70–99)
Glucose-Capillary: 216 mg/dL — ABNORMAL HIGH (ref 70–99)
Glucose-Capillary: 159 mg/dL — ABNORMAL HIGH (ref 70–99)
Glucose-Capillary: 198 mg/dL — ABNORMAL HIGH (ref 70–99)

## 2018-05-22 LAB — PHOSPHORUS: Phosphorus: 4.6 mg/dL (ref 2.5–4.6)

## 2018-05-22 MED ORDER — TECHNETIUM TC 99M DIETHYLENETRIAME-PENTAACETIC ACID
31.0000 | Freq: Once | INTRAVENOUS | Status: AC | PRN
  Administered 2018-05-22: 31 via INTRAVENOUS

## 2018-05-22 MED ORDER — SODIUM CHLORIDE 0.9% FLUSH
10.0000 mL | Freq: Two times a day (BID) | INTRAVENOUS | Status: DC
  Administered 2018-05-22 – 2018-05-23 (×2): 20 mL
  Administered 2018-05-23: 10 mL
  Administered 2018-05-24: 20 mL
  Administered 2018-05-24: 10 mL
  Administered 2018-05-25: 30 mL
  Administered 2018-05-26: 10 mL

## 2018-05-22 MED ORDER — TECHNETIUM TO 99M ALBUMIN AGGREGATED
4.3000 | Freq: Once | INTRAVENOUS | Status: AC | PRN
  Administered 2018-05-22: 4.3 via INTRAVENOUS

## 2018-05-22 MED ORDER — COLLAGENASE 250 UNIT/GM EX OINT
TOPICAL_OINTMENT | Freq: Every day | CUTANEOUS | Status: DC
  Administered 2018-05-22 – 2018-05-26 (×5): via TOPICAL
  Filled 2018-05-22: qty 30

## 2018-05-22 MED ORDER — SODIUM CHLORIDE 0.9% FLUSH: 10.0000 mL | INTRAVENOUS | Status: DC | PRN

## 2018-05-22 MED ORDER — GLUCERNA SHAKE PO LIQD
237.0000 mL | Freq: Three times a day (TID) | ORAL | Status: DC
  Administered 2018-05-22 – 2018-05-26 (×8): 237 mL via ORAL

## 2018-05-22 MED ORDER — MAGNESIUM SULFATE 2 GM/50ML IV SOLN
2.0000 g | Freq: Once | INTRAVENOUS | Status: AC
  Administered 2018-05-22: 2 g via INTRAVENOUS
  Filled 2018-05-22: qty 50

## 2018-05-22 MED ORDER — POTASSIUM CHLORIDE CRYS ER 20 MEQ PO TBCR
40.0000 meq | EXTENDED_RELEASE_TABLET | Freq: Once | ORAL | Status: AC
  Administered 2018-05-22: 40 meq via ORAL
  Filled 2018-05-22: qty 2

## 2018-05-22 MED ORDER — PRO-STAT SUGAR FREE PO LIQD
30.0000 mL | Freq: Two times a day (BID) | ORAL | Status: DC
  Administered 2018-05-22 – 2018-05-26 (×2): 30 mL via ORAL
  Filled 2018-05-22 (×5): qty 30

## 2018-05-22 MED FILL — Verapamil HCl IV Soln 2.5 MG/ML: INTRAVENOUS | Qty: 2 | Status: AC

## 2018-05-22 NOTE — Progress Notes (Signed)
Patient has hospital follow-up appt in the [facility] 06/04/18 at 2:30pm

## 2018-05-22 NOTE — Progress Notes (Signed)
Peripherally Inserted Central Catheter/Midline Placement  The IV Nurse has discussed with the patient and/or persons authorized to consent for the patient, the purpose of this procedure and the potential benefits and risks involved with this procedure.  The benefits include less needle sticks, lab draws from the catheter, and the patient may be discharged home with the catheter. Risks include, but not limited to, infection, bleeding, blood clot (thrombus formation), and puncture of an artery; nerve damage and irregular heartbeat and possibility to perform a PICC exchange if needed/ordered by physician.  Alternatives to this procedure were also discussed.  Bard Power PICC patient education guide, fact sheet on infection prevention and patient information card has been provided to patient /or left at bedside.    PICC/Midline Placement Documentation  PICC Double Lumen 15/94/70 PICC Left Cephalic 46 cm 0 cm (Active)  Indication for Insertion or Continuance of Line Poor Vasculature-patient has had multiple peripheral attempts or PIVs lasting less than 24 hours 05/22/2018  4:53 PM  Exposed Catheter (cm) 0 cm 05/22/2018  4:53 PM  Site Assessment Clean;Dry;Intact 05/22/2018  4:53 PM  Lumen #1 Status Flushed;Blood return noted 05/22/2018  4:53 PM  Lumen #2 Status Flushed;Blood return noted 05/22/2018  4:53 PM  Dressing Type Transparent 05/22/2018  4:53 PM  Dressing Status Clean;Dry;Intact 05/22/2018  4:53 PM  Dressing Intervention New dressing 05/22/2018  4:53 PM  Dressing Change Due 05/29/18 05/22/2018  4:53 PM       Christella Noa Albarece 05/22/2018, 4:55 PM

## 2018-05-22 NOTE — Progress Notes (Addendum)
Nutrition Consult / Follow-up  DOCUMENTATION CODES:   Obesity unspecified  INTERVENTION:    Continue Glucerna Shake, increase to TID, each supplement provides 220 kcal and 10 grams of protein  Add Pro-stat 30 ml BID with lunch and supper, each supplement provides 100 kcal and 15 gm protein  NUTRITION DIAGNOSIS:   Inadequate oral intake related to decreased appetite as evidenced by per patient/family report.  Ongoing  GOAL:   Patient will meet greater than or equal to 90% of their needs  Progressing  MONITOR:   PO intake, Supplement acceptance, Skin  ASSESSMENT:   62 yo Female with severe left ear and pulmonary hypertension and a history of myelodysplastic syndrome was admitted ith A. fib/RVR, anemia and AKI which was felt to be due to low perfusion.  S/P cardiac cath 11/7; no significant CAD found.  Spoke with patient and her son. Patient has been consuming a little less than half of her meals. Family is very supportive and assists patient with meals. Patient says she has not been given any Glucerna Shakes. RN reports patient has been refusing to receive them. Encouraged RN to take supplement in room to encourage patient to drink it. Will also add Pro-stat supplement BID to maximize protein intake.  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 Wound is 13 cm x 9.5 cm x 0.1 cm. Hydrotherapy and enzymatic ointment added for debridement. WOC RN is following.   Labs reviewed. CBG's: 110-147 today; </= 129 yesterday Medications reviewed and include Lasix, Lantus.   Diet Order:   Diet Order            Diet Heart Room service appropriate? Yes; Fluid consistency: Thin  Diet effective now              EDUCATION NEEDS:   Not appropriate for education at this time  Skin:  Skin Assessment: Skin Integrity Issues: Skin Integrity Issues:: Unstageable Unstageable: sacrum  Last BM:  11/4 (type 7)  Height:   Ht Readings from  Last 1 Encounters:  05/11/18 5\' 4"  (1.626 m)    Weight:   Wt Readings from Last 1 Encounters:  05/22/18 95 kg    Ideal Body Weight:  54.5 kg  BMI:  Body mass index is 35.96 kg/m.  Estimated Nutritional Needs:   Kcal:  1600-1800  Protein:  90-110 gm  Fluid:  1.6-1.8 L    Molli Barrows, RD, LDN, [place] Pager [phone] After Hours Pager [phone]

## 2018-05-22 NOTE — Progress Notes (Addendum)
Advanced Heart Failure Rounding Note  PCP-Cardiologist: Quay Burow, MD   Subjective:    Feeling OK today. Sister present. They feel she has been gradually worse over the past 6 months. Not very active. She can walk to the kitchen and fix some food, but has to rest for a very long while afterward. Mild lightheadedness with standing.   + 1.1 L. Given IV lasix 20 mg x 2 yesterday. Weight shows up 7 lbs.   [facility] 05/21/18  - No angiographically significant CAD.  RA (mean): 14 mmHg RV (S/EDP): 110/15 mmHg PA (S/D, mean): 105/40 (62) mmHg PCWP (mean): 25 mmHg  Ao sat: 91% PA sat: 53%  Fick CO: 6.8 L/min Fick CI: 4.3 L/min/m^2  PVR: 5.4 Wood units  Objective:   Weight Range: 95 kg Body mass index is 35.96 kg/m.   Vital Signs:   Temp:  [98 F (36.7 C)-99.8 F (37.7 C)] 98 F (36.7 C) (11/08 0749) Pulse Rate:  [0-119] 81 (11/08 0749) Resp:  [12-28] 20 (11/08 0749) BP: (87-195)/(51-79) 189/65 (11/08 0759) SpO2:  [0 %-99 %] 95 % (11/08 0749) Weight:  [95 kg] 95 kg (11/08 0632) Last BM Date: 05/18/18  Weight change: Filed Weights   05/20/18 0503 05/21/18 0537 05/22/18 9924  Weight: 94.2 kg 92 kg 95 kg   Intake/Output:   Intake/Output Summary (Last 24 hours) at 05/22/2018 0835 Last data filed at 05/22/2018 0508 Gross per 24 hour  Intake 2189.99 ml  Output 1000 ml  Net 1189.99 ml    Physical Exam    General:  Elderly. Chronically ill-appearing. No resp difficulty HEENT: Normal Neck: Supple. JVP to jaw. Carotids 2+ bilat; no bruits. No lymphadenopathy or thyromegaly appreciated. Cor: PMI nondisplaced. Regular rate & rhythm. 3/6 AS. +RV lift Lungs: Clear anteriorly. Abdomen: Soft, nontender, nondistended. No hepatosplenomegaly. No bruits or masses. Good bowel sounds. Extremities: No cyanosis, clubbing, or rash. Trace to 1+ edema.  Neuro: Alert & orientedx3, cranial nerves grossly intact. moves all 4 extremities w/o difficulty. Affect pleasant  Telemetry    NSR 70-80s, personally reviewed.   EKG    NT, ND, no HSM. No bruits or masses. +BS   Labs    CBC Recent Labs    05/21/18 1000 05/22/18 0243  WBC 6.8 7.0  HGB 7.5* 7.8*  HCT 22.7* 23.7*  MCV 89.0 89.8  PLT 165 268   Basic Metabolic Panel Recent Labs    05/21/18 0354 05/22/18 0243  NA 139 139  K 3.9 3.6  CL 106 108  CO2 22 24  GLUCOSE 124* 117*  BUN 32* 27*  CREATININE 0.86 0.83  CALCIUM 9.3 8.9  MG 2.0 1.7  PHOS 4.5 4.6   Liver Function Tests Recent Labs    05/21/18 0354 05/22/18 0243  AST  --  42*  ALT  --  42  ALKPHOS  --  106  BILITOT  --  4.1*  PROT  --  6.0*  ALBUMIN 2.3* 2.1*   No results for input(s): LIPASE, AMYLASE in the last 72 hours. Cardiac Enzymes No results for input(s): CKTOTAL, CKMB, CKMBINDEX, TROPONINI in the last 72 hours.  BNP: BNP (last 3 results) Recent Labs    05/11/18 0650 05/13/18 0333  BNP 2,681.1* 3,571.3*    ProBNP (last 3 results) No results for input(s): PROBNP in the last 8760 hours.   D-Dimer No results for input(s): DDIMER in the last 72 hours. Hemoglobin A1C No results for input(s): HGBA1C in the last 72 hours. Fasting Lipid  Panel No results for input(s): CHOL, HDL, LDLCALC, TRIG, CHOLHDL, LDLDIRECT in the last 72 hours. Thyroid Function Tests No results for input(s): TSH, T4TOTAL, T3FREE, THYROIDAB in the last 72 hours.  Invalid input(s): FREET3  Other results:   Imaging    Dg Ankle Complete Left  Result Date: 05/21/2018 CLINICAL DATA:  Anterior left ankle pain and left foot pain post fall. EXAM: LEFT ANKLE COMPLETE - 3+ VIEW COMPARISON:  None. FINDINGS: There is no evidence of fracture, dislocation, or joint effusion. There is no evidence of arthropathy or other focal bone abnormality. Mild ankle swelling, predominantly laterally. Vascular and soft tissue calcifications noted. IMPRESSION: No acute fracture or dislocation identified about the left ankle. Electronically Signed   By: Fidela Salisbury M.D.   On: 05/21/2018 10:51   Dg Foot Complete Left  Result Date: 05/21/2018 CLINICAL DATA:  Anterior left ankle pain and left foot pain post fall. EXAM: LEFT FOOT - COMPLETE 3+ VIEW COMPARISON:  None. FINDINGS: There is no evidence of fracture or dislocation. There is no evidence of arthropathy or other focal bone abnormality. Soft tissue swelling of the dorsum of the mid and forefoot. Vascular calcifications noted. IMPRESSION: No acute fracture or dislocation identified about the left foot. Dorsal soft tissue swelling noted. Electronically Signed   By: Fidela Salisbury M.D.   On: 05/21/2018 10:54   [place] Ekg Site Rite  Result Date: 05/21/2018 If Site Rite image not attached, placement could not be confirmed due to current cardiac rhythm.     Medications:     Scheduled Medications: . diphenhydrAMINE  25 mg Oral Once  . feeding supplement (GLUCERNA SHAKE)  237 mL Oral BID BM  . furosemide  20 mg Intravenous Once  . furosemide  20 mg Intravenous Once  . Gerhardt's butt cream   Topical BID  . insulin glargine  10 Units Subcutaneous Daily  . latanoprost  1 drop Both Eyes QHS  . mouth rinse  15 mL Mouth Rinse BID  . metoprolol tartrate  5 mg Intravenous Q6H  . pantoprazole  40 mg Oral Daily  . sildenafil  20 mg Oral TID     Infusions: . sodium chloride 10 mL/hr at 05/18/18 0800  . amiodarone 30 mg/hr (05/21/18 2329)     PRN Medications:  sodium chloride, acetaminophen, hydrALAZINE, levalbuterol, metoprolol tartrate, ondansetron (ZOFRAN) IV    Patient Profile   62 y/o woman with multiple medical problems including severe PAH, moderate AS, transfusion dependent anemia admitted with shock in setting of new-onset AF. Course c/b VDRF and renal failure requiring temporary CVVHD  Assessment/Plan   1. PAH - Echo 05/12/18 LVEF 65-70%, Grade 2 DD, Mod AS, Mild/Mod MS, Mod TR, Trivial PI, PA peak pressure 93.  - L/RHC 05/21/18 with no significant CAD, severe pulmonary HTN  with PAP 62 mmHg, PVR 5.4 WU.  - Severe, likely longstanding with NYHAI IIIb-IV - Suspect multifactorial in nature suspect mostly WHO group II (diastolic HF and valvular disease) and III (OSA) but given degree of PH cannot exclude WHO Group I component. PVR 5.4 WU - She is likely end stage. Will focus on volume and symptom management.  - Ordered for VQ scan and will need outpatient sleep study  - PFTs and assess need for home O2 - Continue sildenafil 20 mg TID cautiously.   2. Moderate AS/MS - She is not candidate for intervention with advanced PAH.   3. PAF - Remains in NSR on amio. Did not tolerate AF well at all.  Continue Amio. - Will need Eliquis for home.   4. AKI - Resolved. Cr 0.83 this am.   5. Transfusion dependent anemia.  - Transfuse as needed. Total of 7 uPRBCs given since 05/11/18. - Hgb 7.8 this am. Will discuss threshold for transfusion with MD.   6. Buttock wound - WOC consulted. Plan hydrotherapy - Needs PT  7. Prognosis - Long talk with pt and Sister about disease state education and prognosis. Pt states she does not want to be intubated or on a breathing tube. Sister asks that she waits and talks to her husband and son.  - Pt and sister agree to palliative care consult.   Medication concerns reviewed with patient and pharmacy team. Barriers identified: None at this time.   Length of Stay: [place], [place]  05/22/2018, 8:35 AM  Advanced Heart Failure Team Pager [phone] (M-F; 7a - 4p)  Please contact [facility] for night-coverage after hours (4p -7a ) and weekends on [email or web address]  Patient seen and examined with the above-signed Advanced Practice Provider and/or Housestaff. I personally reviewed laboratory data, imaging studies and relevant notes. I independently examined the patient and formulated the important aspects of the plan. I have edited the note to reflect any of my changes or salient points. I have personally discussed the plan  with the patient and/or family.  Overall stable. Sildenafil started this am and tolerating well. VQ negative. Discussed situation at length with her and her family. They are open to Palliative Care consult. We have ordered. Will attempt to diurese gently and see how she tolerates. Will get hydrotherapy for wound.   Glori Bickers, MD  4:32 PM

## 2018-05-22 NOTE — Care Management Note (Signed)
Case Management Note  Patient Details  Name: Patricia Mata MRN: [id number] Date of Birth: 1956/03/22  Subjective/Objective: Pt presented for Dyspnea, Atrial Fib RVR-  Anemia/AKI and concerns for Sepsis. Pt was intubated in ICU and Extubated 05-18-18.  Continues on IV amio gtt. PT recommendations for CIR. CM did ask for Inpatient Rehab Consult.             Action/Plan: CM has Benefits check in process for Revatio and Eliquis. CM will continue to monitor for additional disposition needs.   Expected Discharge Date:                  Expected Discharge Plan:  IP Rehab Facility  In-House Referral:  Clinical Social Work  Discharge planning Services  CM Consult  Post Acute Care Choice:    Choice offered to:     DME Arranged:    DME Agency:     HH Arranged:    [place] Agency:     Status of Service:  In process, will continue to follow  If discussed at Long Length of Stay Meetings, dates discussed:    Additional Comments:  Bethena Roys, RN 05/22/2018, 12:41 PM

## 2018-05-22 NOTE — Care Management (Signed)
#    4.   S/W  STACY  @ [facility]  # [phone]   1. ELIQUIS    2.5 MG BID     AND   ELIQUIS  5 MG BID COVER- YES CO-PAY- ZERO DOLLARS   FOR  EACH PRESCRIPTION TIER- NO PRIOR APPROVAL- NO   2.SILDENAFIL  TABLET 20 MG 3 TIME  DAILY COVER- YES PRIOR APPROVAL- YES # [phone]     3. REVATIO 20 MG TABLET COVER- NONE FORMULARY NEED TO USE  SILDENAFIL   PREFERRED PHARMACY :  YES  ---  [facility]

## 2018-05-22 NOTE — Consult Note (Signed)
[place] Nurse wound consult note Reason for Consult: sacral area vs MASD WOC nurse had seen this patient for MASD 05/19/18, reported to be incontinent of stool and urine. Reported to be moisture from this on the perirectal areas and sacrum. Today the skin damage does not appear to be in the same area and she has a large area that has yellow slough covering aprox. 90% of the wound.  Wound type:  Unstageable pressure injury in the presence of initial MASD which can make the tissues weak. She was also in kidney failure, respiratory failure and cardiac compromise.  The skin is at risk with MSOF.   Pressure Injury POA: Yes Measurement: 13cm x 9.5cm x 0.1cm  Wound bed:90% yellow/10% pink Drainage (amount, consistency, odor) moderate, thick yellow, with odor Periwound: intact  Dressing procedure/placement/frequency: 1. Add hydrotherapy for conservative debridement 2. Add enzymatic debridement ointment 3. Maximize nutrition for wound healing, added RD consultation 4. Add PT for increased mobilization 5. Turn to patient's side, allow patient to lie on her side, explained to patient and family and bedside nurse 6. On low air loss mattress for moisture management and pressure redistribution.  [place] Nurse team will follow with you and see patient within 10 days for wound assessments.  Please notify [place] nurses of any acute changes in the wounds or any new areas of concern [place] MSN, [place], [place], [place]

## 2018-05-22 NOTE — Progress Notes (Signed)
SLP Cancellation Note  Patient Details Name: Patricia Mata MRN: [id number] DOB: [birth year]   Cancelled treatment:       Reason Eval/Treat Not Completed: Patient unavailable. Will continue efforts to assess diet tolerance and provide education.  Kamerin Grumbine B. [facility], CCC-SLP Speech Language Pathologist [phone]  Shonna Chock 05/22/2018, 12:47 PM

## 2018-05-22 NOTE — Progress Notes (Signed)
Palliative Medicine consult noted. Due to high referral volume, there may be a delay seeing this patient. Please call the Palliative Medicine Team office at [phone] if recommendations are needed in the interim.  Thank you for inviting [place] to see this patient.  Marjie Skiff Kynlei Piontek, RN, BSN, [facility] Palliative Medicine Team 05/22/2018 3:11 PM Office [phone]

## 2018-05-22 NOTE — Progress Notes (Signed)
PT Cancellation Note  Patient Details Name: Patricia Mata MRN: [id number] DOB: [birth year]   Cancelled Treatment:    Reason Eval/Treat Not Completed: Patient at procedure or test/unavailable(Pt off floor for V/Q scan. Will attempt treatment again at later date/time as pt is available. )  11:27 AM, 05/22/18 Etta Grandchild, PT, DPT Physical Therapist - [place] [phone] (Pager)  [phone] (Office)       Alie Hardgrove C 05/22/2018, 11:27 AM

## 2018-05-22 NOTE — Progress Notes (Signed)
Physical Therapy Wound Treatment Patient Details  Name: Patricia Mata MRN: [id number] Date of Birth: [birth year]  Today's Date: 05/22/2018 Time: 5027-7412 Time Calculation (min): 42 min  Subjective  Subjective: Pt stated she was comfortable after the treatment Patient and Family Stated Goals: get better Date of Onset: 05/21/18  Pain Score:  Discomfort with positioning but comfortable during hydrotherapy.  Wound Assessment  Pressure Injury 05/22/18 Unstageable - Full thickness tissue loss in which the base of the ulcer is covered by slough (yellow, tan, gray, green or brown) and/or eschar (tan, brown or black) in the wound bed. (Active)  Wound Image   05/22/2018  1:00 PM  Dressing Type Moist to dry;Barrier Film (skin prep);Gauze (Comment);Foam 05/22/2018  1:00 PM  Dressing Clean;Dry;Intact 05/22/2018  1:00 PM  Dressing Change Frequency Daily 05/22/2018  1:00 PM  State of Healing Eschar 05/22/2018  1:00 PM  Site / Wound Assessment Yellow;Red;Pink 05/22/2018  1:00 PM  % Wound base Red or Granulating 10% 05/22/2018  1:00 PM  % Wound base Yellow/Fibrinous Exudate 90% 05/22/2018  1:00 PM  % Wound base Black/Eschar 0% 05/22/2018  1:00 PM  % Wound base Other/Granulation Tissue (Comment) 0% 05/22/2018  1:00 PM  Peri-wound Assessment Intact 05/22/2018  1:00 PM  Wound Length (cm) 11 cm 05/22/2018  1:00 PM  Wound Width (cm) 9 cm 05/22/2018  1:00 PM  Wound Depth (cm) 0.1 cm 05/22/2018  1:00 PM  Wound Surface Area (cm^2) 99 cm^2 05/22/2018  1:00 PM  Wound Volume (cm^3) 9.9 cm^3 05/22/2018  1:00 PM  Margins Unattached edges (unapproximated) 05/22/2018  1:00 PM  Drainage Amount Other (Comment) 05/22/2018  1:00 PM  Drainage Description Serous 05/22/2018  1:00 PM  Treatment Hydrotherapy (Pulse lavage);Packing (Saline gauze) 05/22/2018  1:00 PM   Santyl applied to wound bed prior to applying dressing.    Hydrotherapy Pulsed lavage therapy - wound location: sacrum Pulsed Lavage with Suction (psi): 8 psi(4-8) Pulsed  Lavage with Suction - Normal Saline Used: 1000 mL Pulsed Lavage Tip: Tip with splash shield   Wound Assessment and Plan  Wound Therapy - Assess/Plan/Recommendations Wound Therapy - Clinical Statement: Pt presents to hydrotherapy with unstageable sacral pressure wound. Can benefit from hydrotherapy to assist with removal of necrotic tissue and to decrease the bioburden. Wound Therapy - Functional Problem List: Decr mobility and sitting tolerance Factors Delaying/Impairing Wound Healing: Immobility;Multiple medical problems;Polypharmacy;Diabetes Mellitus Hydrotherapy Plan: Debridement;Dressing change;Patient/family education;Pulsatile lavage with suction Wound Therapy - Frequency: 6X / week Wound Therapy - Follow Up Recommendations: Other (comment)(CIR) Wound Plan: See above  Wound Therapy Goals- Improve the function of patient's integumentary system by progressing the wound(s) through the phases of wound healing (inflammation - proliferation - remodeling) by: Decrease Necrotic Tissue to: 80 Decrease Necrotic Tissue - Progress: Goal set today Increase Granulation Tissue to: 20 Increase Granulation Tissue - Progress: Goal set today  Goals will be updated until maximal potential achieved or discharge criteria met.  Discharge criteria: when goals achieved, discharge from hospital, MD decision/surgical intervention, no progress towards goals, refusal/missing three consecutive treatments without notification or medical reason.  GP     Shary Decamp [facility] 05/22/2018, 4:38 PM [place] Pager [phone] Office [phone]

## 2018-05-23 DIAGNOSIS — Z515 Encounter for palliative care: Secondary | ICD-10-CM

## 2018-05-23 DIAGNOSIS — Z4659 Encounter for fitting and adjustment of other gastrointestinal appliance and device: Secondary | ICD-10-CM

## 2018-05-23 LAB — RENAL FUNCTION PANEL
Albumin: 2 g/dL — ABNORMAL LOW (ref 3.5–5.0)
Anion gap: 6 (ref 5–15)
BUN: 22 mg/dL (ref 8–23)
CO2: 25 mmol/L (ref 22–32)
Calcium: 8.8 mg/dL — ABNORMAL LOW (ref 8.9–10.3)
Chloride: 103 mmol/L (ref 98–111)
Creatinine, Ser: 0.78 mg/dL (ref 0.44–1.00)
GFR calc Af Amer: >60 mL/min (ref >60)
GFR calc non Af Amer: >60 mL/min (ref >60)
Glucose, Bld: 204 mg/dL — ABNORMAL HIGH (ref 70–99)
Phosphorus: 3.7 mg/dL (ref 2.5–4.6)
Potassium: 3.8 mmol/L (ref 3.5–5.1)
Sodium: 134 mmol/L — ABNORMAL LOW (ref 135–145)

## 2018-05-23 LAB — CBC
HCT: 23.8 % — ABNORMAL LOW (ref 36.0–46.0)
Hemoglobin: 7.8 g/dL — ABNORMAL LOW (ref 12.0–15.0)
MCH: 29.7 pg (ref 26.0–34.0)
MCHC: 32.8 g/dL (ref 30.0–36.0)
MCV: 90.5 fL (ref 80.0–100.0)
Platelets: 205 10*3/uL (ref 150–400)
RBC: 2.63 MIL/uL — ABNORMAL LOW (ref 3.87–5.11)
RDW: 18 % — ABNORMAL HIGH (ref 11.5–15.5)
WBC: 7.4 10*3/uL (ref 4.0–10.5)
nRBC: 0 % (ref 0.0–0.2)

## 2018-05-23 LAB — GLUCOSE, CAPILLARY
Glucose-Capillary: 127 mg/dL — ABNORMAL HIGH (ref 70–99)
Glucose-Capillary: 142 mg/dL — ABNORMAL HIGH (ref 70–99)
Glucose-Capillary: 172 mg/dL — ABNORMAL HIGH (ref 70–99)
Glucose-Capillary: 182 mg/dL — ABNORMAL HIGH (ref 70–99)
Glucose-Capillary: 236 mg/dL — ABNORMAL HIGH (ref 70–99)

## 2018-05-23 LAB — MAGNESIUM: Magnesium: 2.2 mg/dL (ref 1.7–2.4)

## 2018-05-23 MED ORDER — POTASSIUM CHLORIDE CRYS ER 10 MEQ PO TBCR
20.0000 meq | EXTENDED_RELEASE_TABLET | Freq: Two times a day (BID) | ORAL | Status: DC
  Administered 2018-05-23 – 2018-05-26 (×6): 20 meq via ORAL
  Filled 2018-05-23 (×7): qty 2

## 2018-05-23 MED ORDER — SPIRONOLACTONE 12.5 MG HALF TABLET
12.5000 mg | ORAL_TABLET | Freq: Every day | ORAL | Status: DC
  Administered 2018-05-23 – 2018-05-26 (×4): 12.5 mg via ORAL
  Filled 2018-05-23 (×4): qty 1

## 2018-05-23 MED ORDER — POTASSIUM CHLORIDE CRYS ER 20 MEQ PO TBCR
40.0000 meq | EXTENDED_RELEASE_TABLET | Freq: Once | ORAL | Status: AC
  Administered 2018-05-23: 40 meq via ORAL
  Filled 2018-05-23: qty 2

## 2018-05-23 MED ORDER — FUROSEMIDE 10 MG/ML IJ SOLN
40.0000 mg | Freq: Two times a day (BID) | INTRAMUSCULAR | Status: DC
  Administered 2018-05-23 – 2018-05-24 (×3): 40 mg via INTRAVENOUS
  Filled 2018-05-23 (×3): qty 4

## 2018-05-23 NOTE — Progress Notes (Signed)
Patient ID: Patricia Mata, female   DOB: 12-Dec-1955, 63 y.o.   MRN: [id number]   Met with patient, husband, son, sister, and brother.  Together we reviewed patient's medical problems, treatment options, prognosis, and goals.  Patient says her primary goal is to return home and be with her family.  However, she says she does not want to burden her family with her care or decision-making.  Patient speaks candidly in front of her family about her end-of-life.  She again identifies her faith is being strong and that she wants whatever the Reita Cliche will give her.  We talked about CODE STATUS.  Initially, patient said that she would want an attempt at resuscitation but emphatically and consistently states she would never want to go back on a ventilator.  We talked about resuscitation in the setting of her pulmonary hypertension and risk of respiratory compromise.  I explained that CPR and defibrillation would likely be futile, particularly with patient's decision not to be intubated.  The patient then stated that she would not want to be resuscitated.  All the family present verbalized agreement with this decision for patient not to be resuscitated.  We discussed a DNR order in the home.  Son seemed familiar with the DNR form and described it in detail from his work as a Airline pilot.  Will change CODE STATUS to reflect patient's decision for DNR.  She will need a DNR form signed at time of discharge.  Family verbalized concern with patient's care following discharge. We discussed option for home health or hospice.  However, they do not think that they can manage her care in the home.  Patient and family are requesting discharge to rehab.  I think patient is at high risk for recurrent hospitalization.  I would recommend that she is followed at rehab by palliative care with consideration for future hospice involvement in the setting of decline.  All questions answered.  Social support provided to family.  Case discussed  with Dr. Haroldine Laws.  Plan: 1.  DNR 2.  Social work consult for discharge planning 3.  Family interested in rehab placement 4.  Would recommend palliative care follow at time of discharge with consideration for future transition to hospice

## 2018-05-23 NOTE — Progress Notes (Signed)
Physical Therapy Wound Treatment Patient Details  Name: Charae [place]olis MRN: [id number] Date of Birth: 1955/12/16  Today's Date: 05/23/2018 Time: 9562-1308 Time Calculation (min): 40 min  Subjective  Subjective: Pt stated she was comfortable after the treatment Patient and Family Stated Goals: get better Date of Onset: 05/21/18  Pain Score:  Faces pain scale: 2/10; after treatment pt reports pain was "terrible"  Wound Assessment  Pressure Injury 05/22/18 Unstageable - Full thickness tissue loss in which the base of the ulcer is covered by slough (yellow, tan, gray, green or brown) and/or eschar (tan, brown or black) in the wound bed. (Active)  Dressing Type Moist to dry;Barrier Film (skin prep);Gauze (Comment);Foam 05/23/2018 10:08 AM  Dressing Clean;Dry;Intact 05/23/2018 10:08 AM  Dressing Change Frequency Daily 05/23/2018 10:08 AM  State of Healing Eschar 05/23/2018 10:08 AM  Site / Wound Assessment Yellow;Red;Pink 05/23/2018 10:08 AM  % Wound base Red or Granulating 10% 05/23/2018 10:08 AM  % Wound base Yellow/Fibrinous Exudate 90% 05/23/2018 10:08 AM  % Wound base Black/Eschar 0% 05/23/2018 10:08 AM  % Wound base Other/Granulation Tissue (Comment) 0% 05/23/2018 10:08 AM  Peri-wound Assessment Intact 05/23/2018 10:08 AM  Wound Length (cm) 11 cm 05/22/2018  1:00 PM  Wound Width (cm) 9 cm 05/22/2018  1:00 PM  Wound Depth (cm) 0.1 cm 05/22/2018  1:00 PM  Wound Surface Area (cm^2) 99 cm^2 05/22/2018  1:00 PM  Wound Volume (cm^3) 9.9 cm^3 05/22/2018  1:00 PM  Margins Unattached edges (unapproximated) 05/23/2018 10:08 AM  Drainage Amount Moderate 05/23/2018 10:08 AM  Drainage Description Purulent 05/23/2018 10:08 AM  Treatment Debridement (Selective);Hydrotherapy (Pulse lavage);Packing (Saline gauze) 05/23/2018 10:08 AM  Santyl applied to wound bed prior to applying dressing.  Hydrotherapy Pulsed lavage therapy - wound location: sacrum Pulsed Lavage with Suction (psi): 8 psi(4-8) Pulsed Lavage with  Suction - Normal Saline Used: 1000 mL Pulsed Lavage Tip: Tip with splash shield Selective Debridement Selective Debridement - Location: sacrum Selective Debridement - Tools Used: Forceps;Scalpel Selective Debridement - Tissue Removed: yellow unviable tissue   Wound Assessment and Plan  Wound Therapy - Assess/Plan/Recommendations Wound Therapy - Clinical Statement: Tissue beginning to soften and was able to debride some today. Pt appears to tolerate treatment well (says she is "ok" during, no grimacing, no moaning, etc), however afterwards states the pain was "terrible". Unsure how to improve when she appears comfortable during treatment. Will continue to benefit from hydrotherapy to assist with removal of necrotic tissue and to decrease the bioburden. Wound Therapy - Functional Problem List: Decr mobility and sitting tolerance Factors Delaying/Impairing Wound Healing: Immobility;Multiple medical problems;Polypharmacy;Diabetes Mellitus Hydrotherapy Plan: Debridement;Dressing change;Patient/family education;Pulsatile lavage with suction Wound Therapy - Frequency: 6X / week Wound Therapy - Follow Up Recommendations: Other (comment)(CIR) Wound Plan: See above  Wound Therapy Goals- Improve the function of patient's integumentary system by progressing the wound(s) through the phases of wound healing (inflammation - proliferation - remodeling) by: Decrease Necrotic Tissue to: 80 Decrease Necrotic Tissue - Progress: Progressing toward goal Increase Granulation Tissue to: 20 Increase Granulation Tissue - Progress: Progressing toward goal Wound Therapy - Potential for Goals: Good  Goals will be updated until maximal potential achieved or discharge criteria met.  Discharge criteria: when goals achieved, discharge from hospital, MD decision/surgical intervention, no progress towards goals, refusal/missing three consecutive treatments without notification or medical reason.  GP     Thelma Comp 05/23/2018, 10:21 AM   Rolinda Roan, PT, DPT Acute Rehabilitation Services Pager: [phone] Office: [phone]

## 2018-05-23 NOTE — Consult Note (Signed)
Consultation Note Date: 05/23/2018   Patient Name: Patricia Mata  DOB: 16-Sep-1955  MRN: [id number]  Age / Sex: 62 y.o., female  PCP: Glendale Chard, MD Referring Physician: Jettie Booze, MD  Reason for Consultation: Establishing goals of care  HPI/Patient Profile: 62 y.o. 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 fib with RVR and acute on chronic anemia.  Disposition is been complicated by VDRF and acute on chronic renal failure requiring CVVHD.  Patient was intubated on 10/29 extubated on 11/4.  Palliative care was consulted to help establish goals.  Clinical Assessment and Goals of Care: I met with patient to discuss goals.  She relates to me her conversation with Dr. Haroldine Laws and verbalizes an understanding that there are limited treatment options going forward.  She speaks candidly of her faith in [place] and how her faith will guide her in decision-making.  She says she recently started payments on a funeral plot.  She says she has had conversations with her son regarding her end-of-life.  Despite these preparations, patient asks me about her life expectancy and says "will I be around in 10 years."  We discussed hospice as an option in her care and I explained that hospice is appropriate when life expectancy could be limited to 6 months or less.  We discussed CODE STATUS.  Patient verbalized several times that she would not want to be reintubated but then suggested she might be willing to try an attempt at resuscitation.  I explained that in my opinion, resuscitation would likely not result in meaningful recovery given the end-stage nature of her PAH and other comorbidities.  Patient  would be interested in arranging a family meeting.  I spoke with patient's sister and son and will try to meet with family this afternoon.  Prior to this hospitalization, patient was living at home with her son and sister very involved in her care.  Patient says she is progressively weaker and more short of breath over the past few months.  This decline has limited her functioning in the home.  SUMMARY OF RECOMMENDATIONS   1. Continue supportive care and treatment 2. Family meeting schedule     Primary Diagnoses: Present on Admission: **None**   I have reviewed the medical record, interviewed the patient and family, and examined the patient. The following aspects are pertinent.  Past Medical History:  Diagnosis Date  . Cancer ([place])   . Diabetes mellitus without complication ([place])   . Hypertension   . Macrocytic anemia    Social History   Socioeconomic History  . Marital status: Married    Spouse name: Not on file  . Number of children: Not on file  . Years of education: Not on file  . Highest education level: Not on file  Occupational History  . Not on file  Social Needs  .  Financial resource strain: Not on file  . Food insecurity:    Worry: Not on file    Inability: Not on file  . Transportation needs:    Medical: Not on file    Non-medical: Not on file  Tobacco Use  . Smoking status: Former Smoker    Types: Cigarettes    Last attempt to quit: 01/31/2001    Years since quitting: 17.3  . Smokeless tobacco: Former Network engineer and Sexual Activity  . Alcohol use: Yes    Alcohol/week: 2.0 standard drinks    Types: 2 Shots of liquor per week  . Drug use: Never  . Sexual activity: Not on file  Lifestyle  . Physical activity:    Days per week: Not on file    Minutes per session: Not on file  . Stress: Not on file  Relationships  . Social connections:    Talks on phone: Not on file    Gets together: Not on file    Attends religious service: Not on file     Active member of club or organization: Not on file    Attends meetings of clubs or organizations: Not on file    Relationship status: Not on file  Other Topics Concern  . Not on file  Social History Narrative  . Not on file   Family History  Problem Relation Age of Onset  . Hypertension Mother   . Heart attack Father   . Hypertension Sister   . Hypertension Brother   . Hypertension Sister   . Hypertension Sister   . Prostate cancer Brother   . HIV/AIDS Brother    Scheduled Meds: . collagenase   Topical Daily  . diphenhydrAMINE  25 mg Oral Once  . feeding supplement (GLUCERNA SHAKE)  237 mL Oral TID BM  . feeding supplement (PRO-STAT SUGAR FREE 64)  30 mL Oral BID  . furosemide  20 mg Intravenous Once  . furosemide  20 mg Intravenous Once  . furosemide  40 mg Intravenous BID  . Gerhardt's butt cream   Topical BID  . insulin glargine  10 Units Subcutaneous Daily  . latanoprost  1 drop Both Eyes QHS  . mouth rinse  15 mL Mouth Rinse BID  . metoprolol tartrate  5 mg Intravenous Q6H  . pantoprazole  40 mg Oral Daily  . potassium chloride  20 mEq Oral BID  . sildenafil  20 mg Oral TID  . sodium chloride flush  10-40 mL Intracatheter Q12H  . spironolactone  12.5 mg Oral Daily   Continuous Infusions: . sodium chloride 10 mL/hr at 05/18/18 0800  . amiodarone 30 mg/hr (05/23/18 1006)   PRN Meds:.sodium chloride, acetaminophen, hydrALAZINE, levalbuterol, metoprolol tartrate, ondansetron (ZOFRAN) IV, sodium chloride flush Medications Prior to Admission:  Prior to Admission medications   Medication Sig Start Date End Date Taking? Authorizing Provider  atorvastatin (LIPITOR) 40 MG tablet Take 1 tablet (40 mg total) by mouth daily. 04/23/18  Yes [name], FNP  Azilsartan-Chlorthalidone (EDARBYCLOR) 40-12.5 MG TABS Take 1 tablet by mouth daily.    Yes [provider]  colchicine 0.6 MG tablet Take 0.6 mg by mouth 2 (two) times daily as needed (gout flare).  11/02/15  Yes  [provider]  furosemide (LASIX) 20 MG tablet TAKE 1 TABLET BY MOUTH TWICE A DAY 03/19/18  Yes Shadad, Mathis Dad, MD  hydrALAZINE (APRESOLINE) 25 MG tablet Take 2 tablets (50 mg total) by mouth 3 (three) times daily. 02/19/18  Yes  Lorretta Harp, MD  insulin glargine (LANTUS) 100 unit/mL SOPN Inject 0.14 mLs (14 Units total) into the skin daily after breakfast. Patient taking differently: Inject 10 Units into the skin daily after breakfast.  06/16/17  Yes Bonnielee Haff, MD  JANUMET XR 50-500 MG TB24 Take 1 tablet by mouth daily. With evening meal 02/07/18  Yes [provider]  latanoprost (XALATAN) 0.005 % ophthalmic solution Place 1 drop into both eyes at bedtime.    Yes [provider]  lidocaine-prilocaine (EMLA) cream Apply a quarter size of cream to port 1-2 hours prior to access. Cover with saran wrap. 04/17/17  Yes [name], MD  megestrol (MEGACE) 400 MG/10ML suspension Take 10 mLs (400 mg total) by mouth 2 (two) times daily. 03/06/18  Yes [name], MD  metoprolol tartrate (LOPRESSOR) 50 MG tablet TAKE 1 TABLET BY MOUTH TWICE A DAY WITH FOOD 05/04/18  Yes [name], FNP  Multiple Vitamins-Minerals (CENTRUM SILVER 50+WOMEN) TABS Take 1 tablet by mouth daily.   Yes [provider]  prochlorperazine (COMPAZINE) 10 MG tablet Take 1 tablet (10 mg total) by mouth every 6 (six) hours as needed for nausea or vomiting. 04/28/17  Yes [name], MD  Insulin Pen Needle 29G X 5MM MISC Use as directed 06/16/17   Bonnielee Haff, MD   Allergies  Allergen Reactions  . Ancef [Cefazolin] Itching    Severe itching- after procedure, ancef was the antibiotic.-04/02/17  . Sulfa Antibiotics Rash and Other (See Comments)    Blisters, also   Review of Systems  Constitutional: Positive for activity change and fatigue.  Respiratory: Positive for shortness of breath.     Physical Exam  Constitutional: She is oriented to person, place, and time. She  appears well-developed.  Frail-appearing  Cardiovascular: Normal rate.  Pulmonary/Chest: Effort normal.  On O2  Neurological: She is alert and oriented to person, place, and time.  Skin: Skin is warm and dry.    Vital Signs: BP (!) 151/57 (BP Location: Right Arm)   Pulse 79   Temp 98.9 F (37.2 C) (Oral)   Resp (!) 22   Ht '5\' 4"'  (1.626 m)   Wt 93.9 kg   SpO2 98%   BMI 35.53 kg/m  Pain Scale: 0-10 POSS *See Group Information*: S-Acceptable,Sleep, easy to arouse Pain Score: 0-No pain   SpO2: SpO2: 98 % O2 Device:SpO2: 98 % O2 Flow Rate: .O2 Flow Rate (L/min): 1 L/min  IO: Intake/output summary:   Intake/Output Summary (Last 24 hours) at 05/23/2018 1106 Last data filed at 05/23/2018 7106 Gross per 24 hour  Intake 257 ml  Output 550 ml  Net -293 ml    LBM: Last BM Date: 05/18/18 Baseline Weight: Weight: 97.1 kg Most recent weight: Weight: 93.9 kg     Palliative Assessment/Data:     Time In: 1030 Time Out: 1100 Time Total: 30 minutes Greater than 50%  of this time was spent counseling and coordinating care related to the above assessment and plan.  Signed by: Irean Hong, NP   Please contact Palliative Medicine Team phone at [phone] for questions and concerns.  For individual provider: See Shea Evans

## 2018-05-23 NOTE — Progress Notes (Signed)
  Speech Language Pathology Treatment and Discharge Summary: Dysphagia  Patient Details Name: Patricia Mata MRN: [id number] DOB: 1956-05-07 Today's Date: 05/23/2018 Time: 2952-8413 SLP Time Calculation (min) (ACUTE ONLY): 11 min  Assessment / Plan / Recommendation Clinical Impression  Pt seen for skilled ST treatment to assess diet tolerance and for education re: compensatory strategies to reduce aspiration risk. Pt consuming dinner when SLP arrived with assistance from son. They report her intake has improved; pt consumed 1/2 sandwich, pears, and ice cream. Cough at baseline, and some delayed coughing while consuming POs which does not appear attributable to airway compromise. Voice is minimally hoarse but strong, pt reports improvement. SLP educated re: slow rate, small bites and sips/ aspiration precautions. No further skilled ST needs identified at this time. SLP will s/o.    HPI HPI: 62yo female with hx severe AS, severe TR, severe pulmonary HTN, stage 3 colon cancer and myelodysplastic syndrome and anemia requiring frequent transfusions presented 10/28 with dyspnea found to have AFib with RVR, acute on chronic anemia and AKI.  Cardiology requesting ICU admission per PCCM. She developed severe metabolic acidosis and shock requiring mechanical ventilation.  Pt was intubated for 6 days with extubation on 05/18/18 around 9 am.  Most recent CXR from 11/4 shows "Stable haziness in the perihilar regions and lung bases. Findings are nonspecific but may be related to vascular congestion or mild edema"      SLP Plan  All goals met;Discharge SLP treatment due to (comment)       Recommendations  Diet recommendations: Regular;Thin liquid Liquids provided via: Straw;Cup Medication Administration: Whole meds with liquid Supervision: Patient able to self feed Compensations: Minimize environmental distractions;Slow rate;Small sips/bites Postural Changes and/or Swallow Maneuvers: Seated upright 90 degrees                 Plan: All goals met;Discharge SLP treatment due to (comment)       [place], [place], [place] Speech-Language Pathologist Acute Rehabilitation Services Pager: [phone] Office: [phone]   Aliene Altes 05/23/2018, 6:32 PM

## 2018-05-23 NOTE — Progress Notes (Signed)
Palliative meeting with Pt and Family. Code status changed per pt and family wishes. Order verified with charge nurse Kerrie Buffalo.  DNR armband placed.  Pt and family verbalized understanding.

## 2018-05-23 NOTE — Progress Notes (Addendum)
Advanced Heart Failure Rounding Note  PCP-Cardiologist: Quay Burow, MD   Subjective:    PICC line placed last night. Given IV lasix. I/OS incomplete. Weight up 7 pounds.   Feels OK. Weak. Denies CP/SOB, orthopnea or PND. Tolerating sildenafil.     [facility] 05/21/18  - No angiographically significant CAD.  RA (mean): 14 mmHg RV (S/EDP): 110/15 mmHg PA (S/D, mean): 105/40 (62) mmHg PCWP (mean): 25 mmHg  Ao sat: 91% PA sat: 53%  Fick CO: 6.8 L/min Fick CI: 4.3 L/min/m^2  PVR: 5.4 Wood units  Objective:   Weight Range: 95 kg Body mass index is 35.96 kg/m.   Vital Signs:   Temp:  [98 F (36.7 C)-99.5 F (37.5 C)] 99.5 F (37.5 C) (11/09 0435) Pulse Rate:  [74-85] 79 (11/09 0435) Resp:  [14-24] 22 (11/09 0435) BP: (87-189)/(50-65) 162/61 (11/09 0435) SpO2:  [95 %-100 %] 97 % (11/09 0435) Last BM Date: 05/18/18  Weight change: Filed Weights   05/20/18 0503 05/21/18 0537 05/22/18 0347  Weight: 94.2 kg 92 kg 95 kg   Intake/Output:   Intake/Output Summary (Last 24 hours) at 05/23/2018 0651 Last data filed at 05/22/2018 2226 Gross per 24 hour  Intake 257 ml  Output 450 ml  Net -193 ml    Physical Exam    General:  Elderly weak-appearing. Lying in bed No resp difficulty HEENT: normal Neck: supple. JVP to jaw . Carotids 2+ bilat; no bruits. No lymphadenopathy or thryomegaly appreciated. Cor: PMI nondisplaced. Regular rate & rhythm. 2/6 AS RV lift Lungs: clear Abdomen: obese soft, nontender, nondistended. No hepatosplenomegaly. No bruits or masses. Good bowel sounds. Extremities: no cyanosis, clubbing, rash, trace edema  + RUE PICC Neuro: alert & orientedx3, cranial nerves grossly intact. moves all 4 extremities w/o difficulty. Affect pleasant   Telemetry   NSR 70-80s, personally reviewed.    Labs    CBC Recent Labs    05/21/18 1000 05/22/18 0243  WBC 6.8 7.0  HGB 7.5* 7.8*  HCT 22.7* 23.7*  MCV 89.0 89.8  PLT 165 425   Basic Metabolic  Panel Recent Labs    05/21/18 0354 05/22/18 0243  NA 139 139  K 3.9 3.6  CL 106 108  CO2 22 24  GLUCOSE 124* 117*  BUN 32* 27*  CREATININE 0.86 0.83  CALCIUM 9.3 8.9  MG 2.0 1.7  PHOS 4.5 4.6   Liver Function Tests Recent Labs    05/21/18 0354 05/22/18 0243  AST  --  42*  ALT  --  42  ALKPHOS  --  106  BILITOT  --  4.1*  PROT  --  6.0*  ALBUMIN 2.3* 2.1*   No results for input(s): LIPASE, AMYLASE in the last 72 hours. Cardiac Enzymes No results for input(s): CKTOTAL, CKMB, CKMBINDEX, TROPONINI in the last 72 hours.  BNP: BNP (last 3 results) Recent Labs    05/11/18 0650 05/13/18 0333  BNP 2,681.1* 3,571.3*    ProBNP (last 3 results) No results for input(s): PROBNP in the last 8760 hours.   D-Dimer No results for input(s): DDIMER in the last 72 hours. Hemoglobin A1C No results for input(s): HGBA1C in the last 72 hours. Fasting Lipid Panel No results for input(s): CHOL, HDL, LDLCALC, TRIG, CHOLHDL, LDLDIRECT in the last 72 hours. Thyroid Function Tests No results for input(s): TSH, T4TOTAL, T3FREE, THYROIDAB in the last 72 hours.  Invalid input(s): FREET3  Other results:   Imaging    Dg Chest 2 View  Result Date:  05/22/2018 CLINICAL DATA:  Post extubation. EXAM: CHEST - 2 VIEW COMPARISON:  05/18/2018 FINDINGS: Right subclavian approach injectable port remains. The rest of the support apparatus has been removed. Mildly enlarged cardiac silhouette. Calcific atherosclerotic disease of the aorta. There is no evidence of focal airspace consolidation, pleural effusion or pneumothorax. Mild coarsening of the interstitium. Osseous structures are without acute abnormality. Soft tissues are grossly normal. IMPRESSION: Mildly enlarged cardiac silhouette. Coarsening of the interstitium may represent mild bronchitic changes. Electronically Signed   By: Fidela Salisbury M.D.   On: 05/22/2018 12:53   Nm Pulmonary Perf And Vent  Result Date: 05/22/2018 CLINICAL  DATA:  Pulmonary hypertension. EXAM: NUCLEAR MEDICINE VENTILATION - PERFUSION LUNG SCAN TECHNIQUE: Ventilation images were obtained in multiple projections using inhaled aerosol Tc-39m DTPA. Perfusion images were obtained in multiple projections after intravenous injection of Tc-43m-MAA. RADIOPHARMACEUTICALS:  31.0 mCi of Tc-63m DTPA aerosol inhalation and 4.3 mCi Tc35m-MAA IV COMPARISON:  None. FINDINGS: Ventilation: No focal ventilation defect. Perfusion: No wedge shaped peripheral perfusion defects to suggest acute pulmonary embolism. IMPRESSION: Normal pulmonary perfusion. No evidence for acute or chronic pulmonary embolus. Electronically Signed   By: Kerby Moors M.D.   On: 05/22/2018 12:45     Medications:     Scheduled Medications: . collagenase   Topical Daily  . diphenhydrAMINE  25 mg Oral Once  . feeding supplement (GLUCERNA SHAKE)  237 mL Oral TID BM  . feeding supplement (PRO-STAT SUGAR FREE 64)  30 mL Oral BID  . furosemide  20 mg Intravenous Once  . furosemide  20 mg Intravenous Once  . Gerhardt's butt cream   Topical BID  . insulin glargine  10 Units Subcutaneous Daily  . latanoprost  1 drop Both Eyes QHS  . mouth rinse  15 mL Mouth Rinse BID  . metoprolol tartrate  5 mg Intravenous Q6H  . pantoprazole  40 mg Oral Daily  . sildenafil  20 mg Oral TID  . sodium chloride flush  10-40 mL Intracatheter Q12H    Infusions: . sodium chloride 10 mL/hr at 05/18/18 0800  . amiodarone 30 mg/hr (05/22/18 2228)    PRN Medications: sodium chloride, acetaminophen, hydrALAZINE, levalbuterol, metoprolol tartrate, ondansetron (ZOFRAN) IV, sodium chloride flush    Patient Profile   62 y/o woman with multiple medical problems including severe PAH, moderate AS, transfusion dependent anemia admitted with shock in setting of new-onset AF. Course c/b VDRF and renal failure requiring temporary CVVHD  Assessment/Plan   1. PAH - Echo 05/12/18 LVEF 65-70%, Grade 2 DD, Mod AS, Mild/Mod  MS, Mod TR, Trivial PI, PA peak pressure 93.  - L/RHC 05/21/18 with no significant CAD, severe pulmonary HTN with PAP 62 mmHg, PVR 5.4 WU.  - Severe, likely longstanding with NYHAI IIIB-IV - Suspect multifactorial in nature suspect mostly WHO group II (diastolic HF and valvular disease) and III (OSA) but given degree of PH cannot exclude WHO Group I component. PVR 5.4 WU - She is likely end stage. Will focus on volume and symptom management.  - VQs negative - PFTs and assess need for home O2 - Continue sildenafil 20 mg TID cautiously. Tolerating ok - Will diurese as tolerated - Palliative Care Consult placed and pending  2. Moderate AS/MS - She is not candidate for intervention with advanced PAH.   3. PAF - Remains in NSR on amio. Did not tolerate AF well at all. Continue Amio. - Will need Eliquis for home.   4. AKI - Resolved. Cr 0.83 this  am. Watch closely with diuresis  5. Transfusion dependent anemia.  - Transfuse as needed. Total of 7 uPRBCs given since 05/11/18. - Hgb stable at 7.8 this am. Transfuse below 7.5  6. Buttock wound - WOC consulted. Plan hydrotherapy - Needs PT  7. HTN - BP up. add spiro carefully with recent AKI Goal BP 130-140  8. Prognosis - Long talk with pt and family about disease state education and prognosis on 11/7. Pt states she does not want to be intubated or on a breathing tube. Sister asks that she waits and talks to her husband and son.  - Pt and sister agree to palliative care consult. Placed and pending. - Will need to decide regarding SNF/home hospice at discharge  Medication concerns reviewed with patient and pharmacy team. Barriers identified: None at this time.   Length of Stay: [place], MD  05/23/2018, 6:51 AM  Advanced Heart Failure Team Pager [phone] (M-F; 7a - 4p)  Please contact [facility] for night-coverage after hours (4p -7a ) and weekends on [email or web address]

## 2018-05-23 NOTE — Progress Notes (Signed)
Physical Therapy Treatment Patient Details Name: Patricia Mata MRN: [id number] DOB: 20-Jul-1955 Today's Date: 05/23/2018    History of Present Illness 62yo female with hx severe AS, severe TR, severe pulmonary HTN, stage 3 colon cancer and myelodysplastic syndrome and anemia requiring frequent transfusions presented 10/28 with dyspnea found to have AFib with RVR, acute on chronic anemia and AKI.  Cardiology requesting ICU admission per PCCM. She developed severe metabolic acidosis and shock requiring mechanical ventilation.  Pt was intubated for 6 days with extubation on 05/18/18    PT Comments    Pt very weak today, attempted standing from bed twice with 2 person assist and pt unable to clear bed with max A. With stedy, she was able to stand with max A +2 for seat to bed placed and pt pivoted to chair. Pt with minimal verbalization throughout session and had difficulty sequencing tasks. Pt unlikely candidate for CIR at his point, changing rec to SNF. PT will continue to follow.    Follow Up Recommendations  SNF     Equipment Recommendations  Other (comment)(TBD)    Recommendations for Other Services       Precautions / Restrictions Precautions Precautions: Fall Restrictions Weight Bearing Restrictions: No    Mobility  Bed Mobility Overal bed mobility: Needs Assistance Bed Mobility: Supine to Sit     Supine to sit: Mod assist     General bed mobility comments: multi modal cues to initiate and sequence task of coming to EOB. Mod A at trunk to get to SL, pt able to slide LE's off bed, mod A to scoot to EOB  Transfers Overall transfer level: Needs assistance Equipment used: 2 person hand held assist Transfers: Sit to/from Stand;Stand Pivot Transfers Sit to Stand: +2 physical assistance;Max assist Stand pivot transfers: +2 physical assistance;+2 safety/equipment;Total assist       General transfer comment: attempted standing with 2 person HHA 2x and pt could not effectively  clear buttocks or take steps to transfer to chair, even with max A given. Used stedy after that and pt able to stand to it with +2 max A. Needed facilitation at hips to move hips fwd for seat to be put down and then brought back up again.   Ambulation/Gait             General Gait Details: unable   Stairs             Wheelchair Mobility    Modified Rankin (Stroke Patients Only)       Balance Overall balance assessment: Needs assistance Sitting-balance support: Feet supported Sitting balance-Leahy Scale: Fair Sitting balance - Comments: able to maintain static balance EOB   Standing balance support: During functional activity;Bilateral upper extremity supported Standing balance-Leahy Scale: Zero Standing balance comment: reliance on bilateral UEs and external support                            Cognition Arousal/Alertness: Awake/alert Behavior During Therapy: Flat affect Overall Cognitive Status: Impaired/Different from baseline Area of Impairment: Attention;Memory;Following commands;Safety/judgement;Awareness;Problem solving                   Current Attention Level: Sustained Memory: Decreased short-term memory Following Commands: Follows one step commands with increased time Safety/Judgement: Decreased awareness of safety Awareness: Emergent Problem Solving: Slow processing;Requires verbal cues;Requires tactile cues;Decreased initiation;Difficulty sequencing General Comments: slow processing      Exercises General Exercises - Lower Extremity Ankle Circles/Pumps: AROM;Both;10 reps;Seated Long  Arc Quad: AROM;Both;10 reps;Seated    General Comments General comments (skin integrity, edema, etc.): feet kept down in chair to change pressure on buttocks from supine      Pertinent Vitals/Pain Pain Assessment: Faces Faces Pain Scale: Hurts even more Pain Location: buttocks Pain Descriptors / Indicators: Discomfort Pain Intervention(s):  Limited activity within patient's tolerance;Monitored during session    Home Living                      Prior Function            PT Goals (current goals can now be found in the care plan section) Acute Rehab PT Goals Patient Stated Goal: to go home PT Goal Formulation: With patient/family Time For Goal Achievement: 06/02/18 Potential to Achieve Goals: Good Progress towards PT goals: Not progressing toward goals - comment(very weak today)    Frequency    Min 2X/week      PT Plan Discharge plan needs to be updated;Frequency needs to be updated    Co-evaluation              AM-PAC PT "6 Clicks" Daily Activity  Outcome Measure  Difficulty turning over in bed (including adjusting bedclothes, sheets and blankets)?: Unable Difficulty moving from lying on back to sitting on the side of the bed? : Unable Difficulty sitting down on and standing up from a chair with arms (e.g., wheelchair, bedside commode, etc,.)?: Unable Help needed moving to and from a bed to chair (including a wheelchair)?: A Lot Help needed walking in hospital room?: Total Help needed climbing 3-5 steps with a railing? : Total 6 Click Score: 7    End of Session Equipment Utilized During Treatment: Gait belt Activity Tolerance: Patient tolerated treatment well;Patient limited by fatigue Patient left: in chair;with call bell/phone within reach;with chair alarm set Nurse Communication: Mobility status PT Visit Diagnosis: Difficulty in walking, not elsewhere classified (R26.2);Muscle weakness (generalized) (M62.81)     Time: 1000-1038 PT Time Calculation (min) (ACUTE ONLY): 38 min  Charges:  $Therapeutic Exercise: 8-22 mins $Therapeutic Activity: 23-37 mins                     [place]  Pager [phone] Office [place] 05/23/2018, 1:10 PM

## 2018-05-23 NOTE — Progress Notes (Signed)
Inpatient Rehabilitation Admissions Coordinator  Noted plans for SNF vs home with hospice. I will sign off.  Danne Baxter, RN, MSN Rehab Admissions Coordinator [phone] 05/23/2018 9:18 AM

## 2018-05-24 LAB — RENAL FUNCTION PANEL
Albumin: 1.9 g/dL — ABNORMAL LOW (ref 3.5–5.0)
Albumin: 2 g/dL — ABNORMAL LOW (ref 3.5–5.0)
Anion gap: 6 (ref 5–15)
Anion gap: 8 (ref 5–15)
BUN: 22 mg/dL (ref 8–23)
BUN: 21 mg/dL (ref 8–23)
CO2: 24 mmol/L (ref 22–32)
CO2: 23 mmol/L (ref 22–32)
Calcium: 9 mg/dL (ref 8.9–10.3)
Calcium: 8.8 mg/dL — ABNORMAL LOW (ref 8.9–10.3)
Chloride: 105 mmol/L (ref 98–111)
Chloride: 102 mmol/L (ref 98–111)
Creatinine, Ser: 0.78 mg/dL (ref 0.44–1.00)
Creatinine, Ser: 0.7 mg/dL (ref 0.44–1.00)
GFR calc Af Amer: >60 mL/min (ref >60)
GFR calc Af Amer: >60 mL/min (ref >60)
GFR calc non Af Amer: >60 mL/min (ref >60)
GFR calc non Af Amer: >60 mL/min (ref >60)
Glucose, Bld: 173 mg/dL — ABNORMAL HIGH (ref 70–99)
Glucose, Bld: 181 mg/dL — ABNORMAL HIGH (ref 70–99)
Phosphorus: 3.2 mg/dL (ref 2.5–4.6)
Phosphorus: 3.3 mg/dL (ref 2.5–4.6)
Potassium: 4.4 mmol/L (ref 3.5–5.1)
Potassium: 4.3 mmol/L (ref 3.5–5.1)
Sodium: 135 mmol/L (ref 135–145)
Sodium: 133 mmol/L — ABNORMAL LOW (ref 135–145)

## 2018-05-24 LAB — CBC
HCT: 23.9 % — ABNORMAL LOW (ref 36.0–46.0)
Hemoglobin: 7.4 g/dL — ABNORMAL LOW (ref 12.0–15.0)
MCH: 28.6 pg (ref 26.0–34.0)
MCHC: 31 g/dL (ref 30.0–36.0)
MCV: 92.3 fL (ref 80.0–100.0)
Platelets: 231 10*3/uL (ref 150–400)
RBC: 2.59 MIL/uL — ABNORMAL LOW (ref 3.87–5.11)
RDW: 17.9 % — ABNORMAL HIGH (ref 11.5–15.5)
WBC: 6.8 10*3/uL (ref 4.0–10.5)
nRBC: 0 % (ref 0.0–0.2)

## 2018-05-24 LAB — GLUCOSE, CAPILLARY
Glucose-Capillary: 145 mg/dL — ABNORMAL HIGH (ref 70–99)
Glucose-Capillary: 170 mg/dL — ABNORMAL HIGH (ref 70–99)
Glucose-Capillary: 176 mg/dL — ABNORMAL HIGH (ref 70–99)
Glucose-Capillary: 202 mg/dL — ABNORMAL HIGH (ref 70–99)
Glucose-Capillary: 227 mg/dL — ABNORMAL HIGH (ref 70–99)

## 2018-05-24 LAB — MAGNESIUM: Magnesium: 1.5 mg/dL — ABNORMAL LOW (ref 1.7–2.4)

## 2018-05-24 MED ORDER — AMIODARONE HCL 200 MG PO TABS
200.0000 mg | ORAL_TABLET | Freq: Two times a day (BID) | ORAL | Status: DC
  Administered 2018-05-24 – 2018-05-26 (×5): 200 mg via ORAL
  Filled 2018-05-24 (×5): qty 1

## 2018-05-24 MED ORDER — METOPROLOL SUCCINATE ER 25 MG PO TB24
25.0000 mg | ORAL_TABLET | Freq: Two times a day (BID) | ORAL | Status: DC
  Administered 2018-05-24 – 2018-05-26 (×5): 25 mg via ORAL
  Filled 2018-05-24 (×5): qty 1

## 2018-05-24 MED ORDER — FUROSEMIDE 10 MG/ML IJ SOLN
80.0000 mg | Freq: Two times a day (BID) | INTRAMUSCULAR | Status: DC
  Administered 2018-05-24: 80 mg via INTRAVENOUS
  Filled 2018-05-24 (×2): qty 8

## 2018-05-24 NOTE — Progress Notes (Signed)
Patient ID: Patricia Mata, female   DOB: March 04, 1956, 62 y.o.   MRN: [id number]     Advanced Heart Failure Rounding Note  PCP-Cardiologist: Quay Burow, MD   Subjective:    Weight stable, I/Os do not look complete.    No complaints this morning, comfortable in bed but has not been up yet.    She remains in NSR on amiodarone gtt.   [facility] 05/21/18  - No angiographically significant CAD.  RA (mean): 14 mmHg RV (S/EDP): 110/15 mmHg PA (S/D, mean): 105/40 (62) mmHg PCWP (mean): 25 mmHg  Ao sat: 91% PA sat: 53%  Fick CO: 6.8 L/min Fick CI: 4.3 L/min/m^2  PVR: 5.4 Wood units  Objective:   Weight Range: 94 kg Body mass index is 35.57 kg/m.   Vital Signs:   Temp:  [98 F (36.7 C)-99.7 F (37.6 C)] 98.9 F (37.2 C) (11/10 0749) Pulse Rate:  [76-81] 76 (11/10 0749) Resp:  [16-20] 16 (11/10 0749) BP: (145-176)/(44-64) 153/54 (11/10 0749) SpO2:  [96 %-100 %] 100 % (11/10 0749) Weight:  [94 kg] 94 kg (11/10 0634) Last BM Date: 05/18/18  Weight change: Filed Weights   05/22/18 0632 05/23/18 0700 05/24/18 0634  Weight: 95 kg 93.9 kg 94 kg   Intake/Output:   Intake/Output Summary (Last 24 hours) at 05/24/2018 1142 Last data filed at 05/24/2018 0726 Gross per 24 hour  Intake 527 ml  Output 1000 ml  Net -473 ml    Physical Exam    General: NAD Neck: JVP 12 cm, no thyromegaly or thyroid nodule.  Lungs: Clear to auscultation bilaterally with normal respiratory effort. CV: Nondisplaced PMI.  Heart regular S1/S2, no W2/X9, 3/6 systolic murmur along the sternal border.  1+ ankle edema.   Abdomen: Soft, nontender, no hepatosplenomegaly, no distention.  Skin: Intact without lesions or rashes.  Neurologic: Alert and oriented x 3.  Psych: Normal affect. Extremities: No clubbing or cyanosis.  HEENT: Normal.    Telemetry   NSR 80s, personally reviewed.    Labs    CBC Recent Labs    05/23/18 0620 05/24/18 0500  WBC 7.4 6.8  HGB 7.8* 7.4*  HCT 23.8* 23.9*  MCV  90.5 92.3  PLT 205 371   Basic Metabolic Panel Recent Labs    05/23/18 0620 05/23/18 1600 05/24/18 0500  NA 134* 135 133*  K 3.8 4.4 4.3  CL 103 105 102  CO2 25 24 23   GLUCOSE 204* 173* 181*  BUN 22 22 21   CREATININE 0.78 0.78 0.70  CALCIUM 8.8* 9.0 8.8*  MG 2.2  --  1.5*  PHOS 3.7 3.2 3.3   Liver Function Tests Recent Labs    05/22/18 0243  05/23/18 1600 05/24/18 0500  AST 42*  --   --   --   ALT 42  --   --   --   ALKPHOS 106  --   --   --   BILITOT 4.1*  --   --   --   PROT 6.0*  --   --   --   ALBUMIN 2.1*   < > 1.9* 2.0*   < > = values in this interval not displayed.   No results for input(s): LIPASE, AMYLASE in the last 72 hours. Cardiac Enzymes No results for input(s): CKTOTAL, CKMB, CKMBINDEX, TROPONINI in the last 72 hours.  BNP: BNP (last 3 results) Recent Labs    05/11/18 0650 05/13/18 0333  BNP 2,681.1* 3,571.3*    ProBNP (last 3 results) No  results for input(s): PROBNP in the last 8760 hours.   D-Dimer No results for input(s): DDIMER in the last 72 hours. Hemoglobin A1C No results for input(s): HGBA1C in the last 72 hours. Fasting Lipid Panel No results for input(s): CHOL, HDL, LDLCALC, TRIG, CHOLHDL, LDLDIRECT in the last 72 hours. Thyroid Function Tests No results for input(s): TSH, T4TOTAL, T3FREE, THYROIDAB in the last 72 hours.  Invalid input(s): FREET3  Other results:   Imaging    No results found.   Medications:     Scheduled Medications: . collagenase   Topical Daily  . diphenhydrAMINE  25 mg Oral Once  . feeding supplement (GLUCERNA SHAKE)  237 mL Oral TID BM  . feeding supplement (PRO-STAT SUGAR FREE 64)  30 mL Oral BID  . furosemide  20 mg Intravenous Once  . furosemide  20 mg Intravenous Once  . furosemide  40 mg Intravenous BID  . Gerhardt's butt cream   Topical BID  . insulin glargine  10 Units Subcutaneous Daily  . latanoprost  1 drop Both Eyes QHS  . mouth rinse  15 mL Mouth Rinse BID  . metoprolol  succinate  25 mg Oral BID  . pantoprazole  40 mg Oral Daily  . potassium chloride  20 mEq Oral BID  . sildenafil  20 mg Oral TID  . sodium chloride flush  10-40 mL Intracatheter Q12H  . spironolactone  12.5 mg Oral Daily    Infusions: . sodium chloride 10 mL/hr at 05/18/18 0800  . amiodarone 30 mg/hr (05/24/18 0932)    PRN Medications: sodium chloride, acetaminophen, hydrALAZINE, levalbuterol, metoprolol tartrate, ondansetron (ZOFRAN) IV, sodium chloride flush    Patient Profile   62 y/o woman with multiple medical problems including severe PAH, moderate AS, transfusion dependent anemia admitted with shock in setting of new-onset AF. Course c/b VDRF and renal failure requiring temporary CVVHD  Assessment/Plan   1. PAH - Echo 05/12/18 LVEF 65-70%, Grade 2 DD, Mod AS, Mild/Mod MS, Mod TR, Trivial PI, PA peak pressure 93.  - L/RHC 05/21/18 with no significant CAD, severe pulmonary HTN with PAP 62 mmHg, PVR 5.4 WU.  - Severe, likely longstanding with NYHAI IIIB-IV - Suspect multifactorial in nature suspect mostly WHO group II (diastolic HF and valvular disease) and III (OSA) but given degree of PH cannot exclude WHO Group I component. PVR 5.4 WU - She is likely end stage. Will focus on volume and symptom management.  - VQ negative - PFTs and assess need for home O2 - Continue sildenafil 20 mg TID cautiously. Tolerating ok - Looks volume overloaded.  Will set up CVP, increase Lasix to 80 IV bid for now.  - Palliative Care has seen, plan for discharge to rehab with palliative care, eventually will probably transition to hospice.   2. Moderate AS/MS - She is not candidate for intervention with advanced PAH.   3. PAF - Remains in NSR on amio. Did not tolerate AF well at all. Transition to po amiodarone.  - Stop IV metoprolol, start Toprol XL 25 mg bid.  - She ideally will be anticoagulated but has had multiple transfusions recently.  Send CBC today. Can consider Eliquis if hgb  stabilizes.     4. AKI - Resolved. Watch with diuresis.   5. Transfusion dependent anemia.  - Transfuse as needed. Total of 7 uPRBCs given since 05/11/18. - Needs CBC today.   6. Buttock wound - WOC consulted. Plan hydrotherapy - Needs PT  7. HTN - BP remains  mildly elevated.   8. Prognosis - Long talk with pt and family about disease state education and prognosis on 11/7. Pt states she does not want to be intubated or on a breathing tube.  - Plan for now for SNF with palliative services, possible transition to hospice eventually.  She is DNR/DNI.   Length of Stay: 71  Loralie Champagne, MD  05/24/2018, 11:42 AM  Advanced Heart Failure Team Pager [phone] (M-F; 7a - 4p)  Please contact [facility] for night-coverage after hours (4p -7a ) and weekends on [email or web address]

## 2018-05-24 NOTE — Progress Notes (Signed)
OT Cancellation Note  Patient Details Name: Patricia Mata MRN: [id number] DOB: [birth year]   Cancelled Treatment:    Reason Eval/Treat Not Completed: Pt visiting with large number of family members. Did not disturb.   Malka So 05/24/2018, 2:56 PM  Nestor Lewandowsky, OTR/L Acute Rehabilitation Services Pager: [phone] Office: [phone]

## 2018-05-24 NOTE — NC FL2 (Signed)
[place] LEVEL OF CARE SCREENING TOOL     IDENTIFICATION  Patient Name: Patricia Mata Birthdate: 1956-06-26 Sex: female Admission Date (Current Location): 05/11/2018  [facility] and [place] Number:  Herbalist and Address:  [facility], [place] [address] [place], [place] [address]      Provider Number: [id number]  Attending Physician Name and Address:  Jettie Booze, MD  Relative Name and Phone Number:       Current Level of Care: Hospital Recommended Level of Care: [place] Prior Approval Number:    Date Approved/Denied:   PASRR Number: [id number]  Discharge Plan: SNF    Current Diagnoses: Patient Active Problem List   Diagnosis Date Noted  . Palliative care encounter   . Pressure injury of skin 05/22/2018  . Acute kidney injury (nontraumatic) ([place])   . Lactic acidosis   . Acute hypoxemic respiratory failure ([place])   . Cardiogenic shock ([place])   . Atrial fibrillation with rapid ventricular response ([place]) 05/11/2018  . Symptomatic anemia 06/14/2017  . Severe aortic stenosis 06/10/2017  . Carotid artery disease ([place]) 06/10/2017  . Essential hypertension 05/13/2017  . Hyperlipidemia 05/13/2017  . Bilateral lower extremity edema 05/13/2017  . Port-A-Cath in place 04/28/2017  . MDS (myelodysplastic syndrome) ([place]) 03/20/2017  . Goals of care, counseling/discussion 03/20/2017  . Anemia in chronic kidney disease 05/10/2016  . Anemia in stage 1 chronic kidney disease 05/10/2016  . Anemia 02/09/2016  . 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 Open wound right tabia, right sacrum)                       Personal Care Assistance Level of Assistance  Bathing, Feeding, Dressing Bathing Assistance: Maximum assistance Feeding assistance: Maximum assistance Dressing Assistance: Maximum assistance     Functional Limitations Info  Sight, Hearing, Speech Sight Info: Adequate Hearing Info: Adequate Speech Info: Adequate    SPECIAL CARE FACTORS FREQUENCY  PT (By licensed PT), OT (By licensed OT)     PT Frequency: 2x OT Frequency: 2x            Contractures Contractures Info: Not present    Additional Factors Info  Code Status, Allergies Code Status Info: DNR Allergies Info: Ancef Cefazolin, Sulfa Antibiotics           Current Medications (05/24/2018):  This is the current hospital active medication list Current Facility-Administered Medications  Medication Dose Route Frequency Provider Last Rate Last Dose  . 0.9 %  sodium chloride infusion   Intravenous PRN End, Harrell Gave, MD 10 mL/hr at 05/18/18 0800    . acetaminophen (TYLENOL) tablet 650 mg  650 mg Oral Q4H PRN End, Christopher, MD   650 mg at 05/22/18 1811  . amiodarone (NEXTERONE PREMIX) 360-4.14 MG/200ML-% (1.8 mg/mL) IV infusion  30 mg/hr Intravenous Continuous End, Christopher, MD 16.67 mL/hr at 05/24/18 0932 30 mg/hr at 05/24/18 0932  . collagenase (SANTYL) ointment   Topical Daily  Jettie Booze, MD      . diphenhydrAMINE (BENADRYL) capsule 25 mg  25 mg Oral Once End, Christopher, MD      . feeding supplement (GLUCERNA SHAKE) (GLUCERNA SHAKE) liquid 237 mL  237 mL Oral TID BM Jettie Booze, MD   237 mL at 05/23/18 2020  . feeding supplement (PRO-STAT SUGAR FREE 64) liquid 30 mL  30 mL Oral BID Jettie Booze, MD   30 mL at 05/22/18 1607  . furosemide (LASIX) injection 20  mg  20 mg Intravenous Once End, Christopher, MD      . furosemide (LASIX) injection 20 mg  20 mg Intravenous Once End, Christopher, MD      . furosemide (LASIX) injection 40 mg  40 mg Intravenous BID Bensimhon, Shaune Pascal, MD   40 mg at 05/24/18 0933  . Gerhardt's butt cream   Topical BID End, Harrell Gave, MD      . hydrALAZINE (APRESOLINE) injection 10 mg  10 mg Intravenous Q4H PRN End, Harrell Gave, MD   10 mg at 05/18/18 1040  . insulin glargine (LANTUS) injection 10 Units  10 Units Subcutaneous Daily End, Christopher, MD   10 Units at 05/24/18 0933  . latanoprost (XALATAN) 0.005 % ophthalmic solution 1 drop  1 drop Both Eyes QHS End, Christopher, MD   1 drop at 05/23/18 2150  . levalbuterol (XOPENEX) nebulizer solution 0.63 mg  0.63 mg Nebulization Q4H PRN End, Harrell Gave, MD   0.63 mg at 05/18/18 1120  . MEDLINE mouth rinse  15 mL Mouth Rinse BID End, Harrell Gave, MD   15 mL at 05/24/18 0935  . metoprolol tartrate (LOPRESSOR) injection 2.5-5 mg  2.5-5 mg Intravenous Q3H PRN End, Christopher, MD      . metoprolol tartrate (LOPRESSOR) injection 5 mg  5 mg Intravenous Q6H End, Christopher, MD   5 mg at 05/24/18 0532  . ondansetron (ZOFRAN) injection 4 mg  4 mg Intravenous Q6H PRN End, Christopher, MD      . pantoprazole (PROTONIX) EC tablet 40 mg  40 mg Oral Daily End, Christopher, MD   40 mg at 05/24/18 0935  . potassium chloride (K-DUR,KLOR-CON) CR tablet 20 mEq  20 mEq Oral BID Bensimhon, Shaune Pascal, MD   20 mEq at 05/24/18 0934  . sildenafil (REVATIO) tablet 20 mg  20 mg Oral TID Bensimhon, Shaune Pascal, MD   20 mg at 05/24/18 0934  . sodium chloride flush (NS) 0.9 % injection 10-40 mL  10-40 mL Intracatheter Q12H Jettie Booze, MD   10 mL at 05/24/18 0935  . sodium chloride flush (NS) 0.9 % injection 10-40 mL  10-40 mL Intracatheter PRN Jettie Booze, MD      . spironolactone (ALDACTONE) tablet 12.5 mg  12.5 mg Oral Daily Bensimhon, Shaune Pascal, MD   12.5 mg at 05/24/18 2841    Facility-Administered Medications Ordered in Other Encounters  Medication Dose Route Frequency Provider Last Rate Last Dose  . sodium chloride flush (NS) 0.9 % injection 10 mL  10 mL Intravenous PRN [name], MD         Discharge Medications: Please see discharge summary for a list of discharge medications.  Relevant Imaging Results:  Relevant Lab Results:   Additional Information SSN: [id number]  Eileen Stanford, LCSW

## 2018-05-25 LAB — CBC WITH DIFFERENTIAL/PLATELET
Abs Immature Granulocytes: 0.05 10*3/uL (ref 0.00–0.07)
Basophils Absolute: 0 10*3/uL (ref 0.0–0.1)
Basophils Relative: 0 %
Eosinophils Absolute: 0.1 10*3/uL (ref 0.0–0.5)
Eosinophils Relative: 1 %
HCT: 23.3 % — ABNORMAL LOW (ref 36.0–46.0)
Hemoglobin: 7.7 g/dL — ABNORMAL LOW (ref 12.0–15.0)
Immature Granulocytes: 1 %
Lymphocytes Relative: 12 %
Lymphs Abs: 0.8 10*3/uL (ref 0.7–4.0)
MCH: 29.8 pg (ref 26.0–34.0)
MCHC: 33 g/dL (ref 30.0–36.0)
MCV: 90.3 fL (ref 80.0–100.0)
Monocytes Absolute: 0.8 10*3/uL (ref 0.1–1.0)
Monocytes Relative: 12 %
Neutro Abs: 5.2 10*3/uL (ref 1.7–7.7)
Neutrophils Relative %: 74 %
Platelets: 261 10*3/uL (ref 150–400)
RBC: 2.58 MIL/uL — ABNORMAL LOW (ref 3.87–5.11)
RDW: 18.1 % — ABNORMAL HIGH (ref 11.5–15.5)
WBC: 6.9 10*3/uL (ref 4.0–10.5)
nRBC: 0 % (ref 0.0–0.2)

## 2018-05-25 LAB — RENAL FUNCTION PANEL
Albumin: 2 g/dL — ABNORMAL LOW (ref 3.5–5.0)
Anion gap: 6 (ref 5–15)
BUN: 20 mg/dL (ref 8–23)
CO2: 25 mmol/L (ref 22–32)
Calcium: 9 mg/dL (ref 8.9–10.3)
Chloride: 104 mmol/L (ref 98–111)
Creatinine, Ser: 0.8 mg/dL (ref 0.44–1.00)
GFR calc Af Amer: >60 mL/min (ref >60)
GFR calc non Af Amer: >60 mL/min (ref >60)
Glucose, Bld: 129 mg/dL — ABNORMAL HIGH (ref 70–99)
Phosphorus: 3.5 mg/dL (ref 2.5–4.6)
Potassium: 4.5 mmol/L (ref 3.5–5.1)
Sodium: 135 mmol/L (ref 135–145)

## 2018-05-25 LAB — GLUCOSE, CAPILLARY
Glucose-Capillary: 126 mg/dL — ABNORMAL HIGH (ref 70–99)
Glucose-Capillary: 128 mg/dL — ABNORMAL HIGH (ref 70–99)
Glucose-Capillary: 148 mg/dL — ABNORMAL HIGH (ref 70–99)

## 2018-05-25 LAB — MAGNESIUM: Magnesium: 1.2 mg/dL — ABNORMAL LOW (ref 1.7–2.4)

## 2018-05-25 LAB — PREPARE RBC (CROSSMATCH)

## 2018-05-25 MED ORDER — FUROSEMIDE 40 MG PO TABS
40.0000 mg | ORAL_TABLET | Freq: Two times a day (BID) | ORAL | Status: DC
  Administered 2018-05-26 (×2): 40 mg via ORAL
  Filled 2018-05-25 (×2): qty 1

## 2018-05-25 MED ORDER — MAGNESIUM SULFATE 4 GM/100ML IV SOLN
4.0000 g | Freq: Once | INTRAVENOUS | Status: AC
  Administered 2018-05-25: 4 g via INTRAVENOUS
  Filled 2018-05-25: qty 100

## 2018-05-25 MED ORDER — HYDRALAZINE HCL 50 MG PO TABS
50.0000 mg | ORAL_TABLET | Freq: Three times a day (TID) | ORAL | Status: DC
  Administered 2018-05-25 – 2018-05-26 (×3): 50 mg via ORAL
  Filled 2018-05-25 (×2): qty 1

## 2018-05-25 MED ORDER — SODIUM CHLORIDE 0.9% IV SOLUTION: Freq: Once | INTRAVENOUS | Status: DC

## 2018-05-25 MED ORDER — FUROSEMIDE 10 MG/ML IJ SOLN
40.0000 mg | Freq: Once | INTRAMUSCULAR | Status: AC
  Administered 2018-05-25: 40 mg via INTRAVENOUS
  Filled 2018-05-25: qty 4

## 2018-05-25 MED ORDER — FUROSEMIDE 40 MG PO TABS
40.0000 mg | ORAL_TABLET | Freq: Two times a day (BID) | ORAL | Status: DC
  Administered 2018-05-25: 40 mg via ORAL
  Filled 2018-05-25: qty 1

## 2018-05-25 NOTE — Progress Notes (Signed)
Inpatient Rehabilitation Admissions Coordinator  Noted palliative consult. Plans are for SNF with palliative services and to transition to Hospice. DNR/DNI. I do not recommend an inpt rehab admit at this time.  Danne Baxter, RN, MSN Rehab Admissions Coordinator [phone] 05/25/2018 1:49 PM

## 2018-05-25 NOTE — Progress Notes (Signed)
Physical Therapy Wound Treatment Patient Details  Name: Patricia Mata MRN: [id number] Date of Birth: 1956-05-14  Today's Date: 05/25/2018 Time: 1025-1052 Time Calculation (min): 27 min  Subjective  Subjective: Pt asking if it is looking better. Patient and Family Stated Goals: get better Date of Onset: 05/21/18  Pain Score:  No flinching, grimacing, or guarding.  Wound Assessment  Pressure Injury 05/22/18 Unstageable - Full thickness tissue loss in which the base of the ulcer is covered by slough (yellow, tan, gray, green or brown) and/or eschar (tan, brown or black) in the wound bed. (Active)  Dressing Type Moist to dry;Barrier Film (skin prep);Gauze (Comment);Foam 05/25/2018 11:00 AM  Dressing Clean;Dry;Intact;Changed 05/25/2018 11:00 AM  Dressing Change Frequency Daily 05/25/2018 11:00 AM  State of Healing Eschar 05/25/2018 11:00 AM  Site / Wound Assessment Yellow;Red;Pink 05/25/2018 11:00 AM  % Wound base Red or Granulating 15% 05/25/2018 11:00 AM  % Wound base Yellow/Fibrinous Exudate 85% 05/25/2018 11:00 AM  % Wound base Black/Eschar 0% 05/25/2018 11:00 AM  % Wound base Other/Granulation Tissue (Comment) 0% 05/25/2018 11:00 AM  Peri-wound Assessment Intact 05/25/2018 11:00 AM  Wound Length (cm) 11 cm 05/22/2018  1:00 PM  Wound Width (cm) 9 cm 05/22/2018  1:00 PM  Wound Depth (cm) 0.1 cm 05/22/2018  1:00 PM  Wound Surface Area (cm^2) 99 cm^2 05/22/2018  1:00 PM  Wound Volume (cm^3) 9.9 cm^3 05/22/2018  1:00 PM  Margins Unattached edges (unapproximated) 05/25/2018 11:00 AM  Drainage Amount Moderate 05/25/2018 11:00 AM  Drainage Description Purulent 05/25/2018 11:00 AM  Treatment Debridement (Selective);Hydrotherapy (Pulse lavage);Packing (Saline gauze) 05/25/2018 11:00 AM   Santyl applied to wound bed prior to applying dressing.    Hydrotherapy Pulsed lavage therapy - wound location: sacrum Pulsed Lavage with Suction (psi): 8 psi(4-8) Pulsed Lavage with Suction - Normal Saline  Used: 1000 mL Pulsed Lavage Tip: Tip with splash shield Selective Debridement Selective Debridement - Location: sacrum Selective Debridement - Tools Used: Scissors;Forceps Selective Debridement - Tissue Removed: yellow necrotic tissue   Wound Assessment and Plan  Wound Therapy - Assess/Plan/Recommendations Wound Therapy - Clinical Statement: Able to remove some necrotic tissue and beginning to get some depth to the superior portion of the wound. Pt appeared comfortable during the treatment. No flinching or grimacing.  Wound Therapy - Functional Problem List: Decr mobility and sitting tolerance Factors Delaying/Impairing Wound Healing: Immobility;Multiple medical problems;Polypharmacy;Diabetes Mellitus Hydrotherapy Plan: Debridement;Dressing change;Patient/family education;Pulsatile lavage with suction Wound Therapy - Frequency: 6X / week Wound Therapy - Follow Up Recommendations: Other (comment)(CIR) Wound Plan: See above  Wound Therapy Goals- Improve the function of patient's integumentary system by progressing the wound(s) through the phases of wound healing (inflammation - proliferation - remodeling) by: Decrease Necrotic Tissue to: 80 Decrease Necrotic Tissue - Progress: Progressing toward goal Increase Granulation Tissue to: 20 Increase Granulation Tissue - Progress: Progressing toward goal  Goals will be updated until maximal potential achieved or discharge criteria met.  Discharge criteria: when goals achieved, discharge from hospital, MD decision/surgical intervention, no progress towards goals, refusal/missing three consecutive treatments without notification or medical reason.  GP     Shary Decamp [place] 05/25/2018, 11:23 AM Advance Pager [phone] Office [phone]

## 2018-05-25 NOTE — Clinical Social Work Note (Signed)
Clinical Social Work Assessment  Patient Details  Name: Patricia Mata MRN: [id number] Date of Birth: [birth year]  Date of referral:  05/25/18               Reason for consult:  Facility Placement                Permission sought to share information with:  Facility Sport and exercise psychologist, Family Supports Permission granted to share information::  Yes, Verbal Permission Granted  Name::     Education officer, community::  SNFs  Relationship::  Spouse  Contact Information:  [phone]  Housing/Transportation Living arrangements for the past 2 months:  [place] of Information:  Patient, Spouse Patient Interpreter Needed:  None Criminal Activity/Legal Involvement Pertinent to Current Situation/Hospitalization:  No - Comment as needed Significant Relationships:  Spouse Lives with:  Spouse Do you feel safe going back to the place where you live?  No Need for family participation in patient care:  Yes (Comment)  Care giving concerns:  CSW received consult for possible SNF placement at time of discharge. CSW spoke with patient and spouse regarding PT recommendation of SNF placement at time of discharge. Patient reported that patient's spouse is currently unable to care for patient at their home given patient's current physical needs and fall risk. Patient expressed understanding of PT recommendation and is agreeable to SNF placement at time of discharge. CSW to continue to follow and assist with discharge planning needs.   Social Worker assessment / plan:  CSW spoke with patient concerning possibility of rehab at [facility] before returning home.  Employment status:  Retired Surveyor, [employer] Care PT Recommendations:  [place] / Referral to community resources:  [place]  Patient/Family's Response to care:  Patient recognizes need for rehab before returning home and is agreeable to a SNF in [place]. Patient reported preference for  [facility]. CSW explained insurance authorization process.   Patient/Family's Understanding of and Emotional Response to Diagnosis, Current Treatment, and Prognosis:  Patient/family is realistic regarding therapy needs and expressed being hopeful for SNF placement. Patient expressed understanding of CSW role and discharge process as well as medical condition. No questions/concerns about plan or treatment.    Emotional Assessment Appearance:  Appears stated age Attitude/Demeanor/Rapport:  Engaged, Gracious Affect (typically observed):  Accepting, Appropriate Orientation:  Oriented to Self, Oriented to Place, Oriented to  Time, Oriented to Situation Alcohol / Substance use:  Not Applicable Psych involvement (Current and /or in the community):  No (Comment)  Discharge Needs  Concerns to be addressed:  Care Coordination Readmission within the last 30 days:  No Current discharge risk:  None Barriers to Discharge:  Continued Medical Work up   [employer], LCSW 05/25/2018, 2:20 PM

## 2018-05-25 NOTE — Evaluation (Signed)
Occupational Therapy Evaluation Patient Details Name: Patricia Mata MRN: [id number] DOB: [birth year] Today's Date: 05/25/2018    History of Present Illness 62yo female with hx severe AS, severe TR, severe pulmonary HTN, stage 3 colon cancer and myelodysplastic syndrome and anemia requiring frequent transfusions presented 10/28 with dyspnea found to have AFib with RVR, acute on chronic anemia and AKI.  Cardiology requesting ICU admission per PCCM. She developed severe metabolic acidosis and shock requiring mechanical ventilation.  Pt was intubated for 6 days with extubation on 05/18/18   Clinical Impression   PTA, pt was living with her husband and was independent; husband performing IADLs including cook and grocery shopping. Pt currently requiring Min A for UB ADLs, Max A for LB ADLs, and Max A +2 for sit<>Stand with sara stedy. Once pt standing with stedy, she was able to maintain standing with Min A. Pt highly motivated to participate in therapy and return to PLOF. Pt would benefit from further acute OT to facilitate safe dc. Recommend dc to CIR for further OT to optimize safety, independence with ADLs, and return to PLOF.      Follow Up Recommendations  CIR;Supervision/Assistance - 24 hour    Equipment Recommendations  Other (comment)(Defer to next venue)    Recommendations for Other Services Rehab consult;PT consult     Precautions / Restrictions Precautions Precautions: Fall Restrictions Weight Bearing Restrictions: No      Mobility Bed Mobility Overal bed mobility: Needs Assistance Bed Mobility: Supine to Sit     Supine to sit: Mod assist;HOB elevated     General bed mobility comments: Mod A to elevate trunk. Tactile cues to bring BLEs towards EOB  Transfers Overall transfer level: Needs assistance   Transfers: Sit to/from Stand Sit to Stand: Max assist;+2 physical assistance;From elevated surface         General transfer comment: Max A +2 to power up into  standing. Once in standing, pt able to maintain standing balance with Min A     Balance Overall balance assessment: Needs assistance Sitting-balance support: No upper extremity supported;Feet supported Sitting balance-Leahy Scale: Fair Sitting balance - Comments: slight right lateral lean at times   Standing balance support: During functional activity;Bilateral upper extremity supported Standing balance-Leahy Scale: Poor Standing balance comment: reliant on UE support                           ADL either performed or assessed with clinical judgement   ADL Overall ADL's : Needs assistance/impaired Eating/Feeding: Supervision/ safety;Set up;Sitting Eating/Feeding Details (indicate cue type and reason): Pt performing self feeding of her lunch upon arrival. Pt with compensatory hiking of her left shoulder to reach food.  Grooming: Min guard;Sitting   Upper Body Bathing: Minimal assistance;Sitting   Lower Body Bathing: Maximal assistance;Sit to/from stand   Upper Body Dressing : Minimal assistance;Sitting   Lower Body Dressing: Maximal assistance;Sit to/from stand   Toilet Transfer: Maximal assistance;+2 for physical assistance Toilet Transfer Details (indicate cue type and reason): Max A +2 to stand using sara steady. Pt maintaining sitting balance at stedy to transition to recliner         Functional mobility during ADLs: Maximal assistance;+2 for physical assistance(sit<>stand at stedy) General ADL Comments: Pt presenting with high motivated to participate in therapy     Vision Baseline Vision/History: Wears glasses Wears Glasses: At all times Patient Visual Report: No change from baseline;Other (comment)(reports she needs to get cataract sx on R eye)  Perception     Praxis      Pertinent Vitals/Pain Pain Assessment: 0-10 Pain Score: 4  Pain Location: Left ankle and left shoulder Pain Descriptors / Indicators: Discomfort Pain Intervention(s):  Monitored during session;Limited activity within patient's tolerance;Repositioned     Hand Dominance Right   Extremity/Trunk Assessment Upper Extremity Assessment Upper Extremity Assessment: LUE deficits/detail LUE Deficits / Details: Shoulder pain with ROM - PROM to 90 only for safety. Pt performing AROM with compensatory techniques to ~50 degrees. WLF ROM for hand, wrist, and elbow LUE: Unable to fully assess due to pain LUE Coordination: decreased gross motor   Lower Extremity Assessment Lower Extremity Assessment: LLE deficits/detail LLE Deficits / Details: Pt with pain at left ankle and difficulty with weight bearing throughout that ankle.    Cervical / Trunk Assessment Cervical / Trunk Assessment: Other exceptions Cervical / Trunk Exceptions: right lateral lean   Communication Communication Communication: No difficulties   Cognition Arousal/Alertness: Awake/alert Behavior During Therapy: Flat affect Overall Cognitive Status: Impaired/Different from baseline Area of Impairment: Attention;Memory;Following commands;Safety/judgement;Awareness;Problem solving                   Current Attention Level: Sustained Memory: Decreased short-term memory Following Commands: Follows one step commands with increased time;Follows one step commands consistently Safety/Judgement: Decreased awareness of safety Awareness: Emergent Problem Solving: Slow processing;Requires verbal cues;Requires tactile cues;Decreased initiation;Difficulty sequencing General Comments: Requiring increased time and cues during session.    General Comments  Sister and son present throughout. RN assisting with transfer.     Exercises     Shoulder Instructions      Home Living Family/patient expects to be discharged to:: Private residence Living Arrangements: Spouse/significant other;Children Available Help at Discharge: Family Type of Home: House Home Access: [place]: One  level     Bathroom Shower/Tub: Teacher, early years/pre: [place]: Environmental consultant - 2 wheels;Shower seat          Prior Functioning/Environment Level of Independence: Independent                 OT Problem List: Decreased strength;Decreased range of motion;Decreased activity tolerance;Impaired balance (sitting and/or standing);Decreased safety awareness;Decreased knowledge of use of DME or AE;Decreased knowledge of precautions;Decreased cognition;Pain;Impaired UE functional use      OT Treatment/Interventions: Therapeutic exercise;Self-care/ADL training;Energy conservation;DME and/or AE instruction;Therapeutic activities;Patient/family education    OT Goals(Current goals can be found in the care plan section) Acute Rehab OT Goals Patient Stated Goal: "To walk again" OT Goal Formulation: With patient Time For Goal Achievement: 06/08/18 Potential to Achieve Goals: Good  OT Frequency: Min 2X/week   Barriers to D/C:            Co-evaluation              AM-PAC PT "6 Clicks" Daily Activity     Outcome Measure Help from another person eating meals?: A Little Help from another person taking care of personal grooming?: A Little Help from another person toileting, which includes using toliet, bedpan, or urinal?: A Lot Help from another person bathing (including washing, rinsing, drying)?: A Lot Help from another person to put on and taking off regular upper body clothing?: A Little Help from another person to put on and taking off regular lower body clothing?: A Lot 6 Click Score: 15   End of Session Equipment Utilized During Treatment: Gait belt;Other (comment)(stedy) Nurse Communication: Mobility status  Activity Tolerance:  Patient tolerated treatment well Patient left: in chair;with call bell/phone within reach;with chair alarm set;with family/visitor present  OT Visit Diagnosis: Unsteadiness on feet (R26.81);Other abnormalities of gait and  mobility (R26.89);Muscle weakness (generalized) (M62.81);Pain Pain - Right/Left: Left Pain - part of body: Shoulder;Ankle and joints of foot                Time: 1610-9604 OT Time Calculation (min): 38 min Charges:  OT General Charges $OT Visit: 1 Visit OT Evaluation $OT Eval Moderate Complexity: 1 Mod OT Treatments $Self Care/Home Management : 23-37 mins  [place], OTR/L Acute Rehab Pager: [phone] Office: [place] 05/25/2018, 1:34 PM

## 2018-05-25 NOTE — Progress Notes (Addendum)
Patient ID: Patricia Mata, female   DOB: 02-10-1956, 62 y.o.   MRN: [id number]     Advanced Heart Failure Rounding Note  PCP-Cardiologist: Quay Burow, MD   Subjective:    Negative 3.1 L, though weight relatively unchanged. ? Accuracy. CVP 6-7 cm on my check.   Feeling OK this am. Says outpatient she was getting 2 units of blood every 2-3 weeks. Denies SOB, lightheadedness or dizziness.   She remains in NSR on amiodarone gtt.   [facility] 05/21/18  - No angiographically significant CAD.  RA (mean): 14 mmHg RV (S/EDP): 110/15 mmHg PA (S/D, mean): 105/40 (62) mmHg PCWP (mean): 25 mmHg  Ao sat: 91% PA sat: 53%  Fick CO: 6.8 L/min Fick CI: 4.3 L/min/m^2  PVR: 5.4 Wood units  Objective:   Weight Range: 94.6 kg Body mass index is 35.79 kg/m.   Vital Signs:   Temp:  [98.7 F (37.1 C)-99.4 F (37.4 C)] 98.7 F (37.1 C) (11/11 0407) Pulse Rate:  [79-82] 79 (11/11 0407) Resp:  [13-19] 13 (11/11 0407) BP: (148-174)/(52-70) 174/52 (11/11 0407) SpO2:  [96 %-97 %] 96 % (11/11 0407) Weight:  [94.6 kg] 94.6 kg (11/11 0500) Last BM Date: 05/18/18  Weight change: Filed Weights   05/23/18 0700 05/24/18 0634 05/25/18 0500  Weight: 93.9 kg 94 kg 94.6 kg   Intake/Output:   Intake/Output Summary (Last 24 hours) at 05/25/2018 0839 Last data filed at 05/25/2018 0408 Gross per 24 hour  Intake 570.62 ml  Output 3050 ml  Net -2479.38 ml    Physical Exam    General: Chronically ill appearing.  HEENT: Normal Neck: Supple. JVP 10-11 cm. Carotids 2+ bilat; no bruits. No thyromegaly or nodule noted. Cor: PMI nondisplaced. RRR, 3/6 systolic murmur along the sternal border. 1+ ankle edema.  Lungs: CTAB, normal effort. Abdomen: Soft, non-tender, non-distended, no HSM. No bruits or masses. +BS  Extremities: No cyanosis, clubbing, or rash. R and LLE no edema.  Neuro: Alert & orientedx3, cranial nerves grossly intact. moves all 4 extremities w/o difficulty. Affect pleasant   Telemetry    NSR 80s, personally reviewed.   Labs    CBC Recent Labs    05/23/18 0620 05/24/18 0500  WBC 7.4 6.8  HGB 7.8* 7.4*  HCT 23.8* 23.9*  MCV 90.5 92.3  PLT 205 841   Basic Metabolic Panel Recent Labs    05/24/18 0500 05/25/18 0541  NA 133* 135  K 4.3 4.5  CL 102 104  CO2 23 25  GLUCOSE 181* 129*  BUN 21 20  CREATININE 0.70 0.80  CALCIUM 8.8* 9.0  MG 1.5* 1.2*  PHOS 3.3 3.5   Liver Function Tests Recent Labs    05/24/18 0500 05/25/18 0541  ALBUMIN 2.0* 2.0*   No results for input(s): LIPASE, AMYLASE in the last 72 hours. Cardiac Enzymes No results for input(s): CKTOTAL, CKMB, CKMBINDEX, TROPONINI in the last 72 hours.  BNP: BNP (last 3 results) Recent Labs    05/11/18 0650 05/13/18 0333  BNP 2,681.1* 3,571.3*    ProBNP (last 3 results) No results for input(s): PROBNP in the last 8760 hours.   D-Dimer No results for input(s): DDIMER in the last 72 hours. Hemoglobin A1C No results for input(s): HGBA1C in the last 72 hours. Fasting Lipid Panel No results for input(s): CHOL, HDL, LDLCALC, TRIG, CHOLHDL, LDLDIRECT in the last 72 hours. Thyroid Function Tests No results for input(s): TSH, T4TOTAL, T3FREE, THYROIDAB in the last 72 hours.  Invalid input(s): FREET3  Other results:  Imaging   No results found.  Medications:    Scheduled Medications: . amiodarone  200 mg Oral BID  . collagenase   Topical Daily  . diphenhydrAMINE  25 mg Oral Once  . feeding supplement (GLUCERNA SHAKE)  237 mL Oral TID BM  . feeding supplement (PRO-STAT SUGAR FREE 64)  30 mL Oral BID  . furosemide  80 mg Intravenous BID  . Gerhardt's butt cream   Topical BID  . insulin glargine  10 Units Subcutaneous Daily  . latanoprost  1 drop Both Eyes QHS  . mouth rinse  15 mL Mouth Rinse BID  . metoprolol succinate  25 mg Oral BID  . pantoprazole  40 mg Oral Daily  . potassium chloride  20 mEq Oral BID  . sildenafil  20 mg Oral TID  . sodium chloride flush  10-40 mL  Intracatheter Q12H  . spironolactone  12.5 mg Oral Daily    Infusions: . sodium chloride 10 mL/hr at 05/18/18 0800    PRN Medications: sodium chloride, acetaminophen, hydrALAZINE, levalbuterol, metoprolol tartrate, ondansetron (ZOFRAN) IV, sodium chloride flush  Patient Profile   62 y/o woman with multiple medical problems including severe PAH, moderate AS, transfusion dependent anemia admitted with shock in setting of new-onset AF. Course c/b VDRF and renal failure requiring temporary CVVHD  Assessment/Plan   1. PAH - Echo 05/12/18 LVEF 65-70%, Grade 2 DD, Mod AS, Mild/Mod MS, Mod TR, Trivial PI, PA peak pressure 93.  - L/RHC 05/21/18 with no significant CAD, severe pulmonary HTN with PAP 62 mmHg, PVR 5.4 WU.  - Severe, likely longstanding with NYHAI IIIB-IV - Suspect multifactorial in nature suspect mostly WHO group II (diastolic HF and valvular disease) and III (OSA) but given degree of PH cannot exclude WHO Group I component. PVR 5.4 WU - She is likely end stage. Will focus on volume and symptom management.  - VQ negative - PFTs and assess need for home O2 - Continue sildenafil 20 mg TID cautiously. Tolerating ok - CVP 6-7 cm with good waveform.  - Will stop IV lasix.  - Will increase home lasix to 40 mg BID.  - Appreciate Palliative Care. Plan for now is to discharge to rehab with palliative care, eventually will probably transition to hospice.   2. Moderate AS/MS - She is not candidate for intervention with advanced PAH. No change.   3. PAF - Remains in NSR on amio. Did not tolerate AF well at all. Transition to po amiodarone.  - Continue Toprol XL 25 mg BID as tolerated.  - Would try and avoid anticoagulation with transfusion dependent anemia. (chronic).  - CHA2DS2/VASc is at least 4.   4. AKI - Resolved. Watch with diuresis.   5. 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 Total of 7 uPRBCs given since 05/11/18. -  Hgb 7.4 05/25/18. Will type and screen and likely will need transfusion today.  - Has been getting BID blood draws. Will cut back. No need for BID renal function panels.  - Requires transfusions every 2-3 weeks as outpatient. Failed Aranesp therapy.   6. Buttock wound - WOC consulted. Plan hydrotherapy - Continue PT. Plan for rehab on discharge.   7. HTN - BP remains elevated. Meds as above.  - Continue hydralazine 50 mg TID.   8. Prognosis - Long talk with pt and family about disease state education and prognosis on 11/7.  - Plan for now for SNF with palliative services, possible transition to hospice  eventually.  She is DNR/DNI.   Length of Stay: [address]  Annamaria Helling  05/25/2018, 8:39 AM  Advanced Heart Failure Team Pager [phone] (M-F; 7a - 4p)  Please contact [facility] for night-coverage after hours (4p -7a ) and weekends on [email or web address]  Patient seen and examined with the above-signed Advanced Practice Provider and/or Housestaff. I personally reviewed laboratory data, imaging studies and relevant notes. I independently examined the patient and formulated the important aspects of the plan. I have edited the note to reflect any of my changes or salient points. I have personally discussed the plan with the patient and/or family.  Looks much better today. Volume status improved. Still hypertensive. Will restart hydralazine and continue spiro. Hgb low. Transfuse 2u RBCs. Continue sildenafil for PH. Discussed with SW. Family interested in [facility] with Palliative following. She may be ready as early as tomorrow am.   Glori Bickers, MD  2:38 PM

## 2018-05-26 LAB — BPAM RBC
Blood Product Expiration Date: 201912032359
Blood Product Expiration Date: 201912052359
Blood Product Expiration Date: 201912032359
ISSUE DATE / TIME: 201911111526
ISSUE DATE / TIME: 201911112024
ISSUE DATE / TIME: 201911111526
Unit Type and Rh: 600
Unit Type and Rh: 600
Unit Type and Rh: 600

## 2018-05-26 LAB — CBC
HCT: 29 % — ABNORMAL LOW (ref 36.0–46.0)
Hemoglobin: 9.2 g/dL — ABNORMAL LOW (ref 12.0–15.0)
MCH: 28.8 pg (ref 26.0–34.0)
MCHC: 31.7 g/dL (ref 30.0–36.0)
MCV: 90.6 fL (ref 80.0–100.0)
Platelets: 282 10*3/uL (ref 150–400)
RBC: 3.2 MIL/uL — ABNORMAL LOW (ref 3.87–5.11)
RDW: 17.7 % — ABNORMAL HIGH (ref 11.5–15.5)
WBC: 7.6 10*3/uL (ref 4.0–10.5)
nRBC: 0 % (ref 0.0–0.2)

## 2018-05-26 LAB — TYPE AND SCREEN
ABO/RH(D): A NEG
ABO/RH(D): A NEG
Antibody Screen: NEGATIVE
Antibody Screen: NEGATIVE
Unit division: 0
Unit division: 0
Unit division: 0

## 2018-05-26 LAB — RENAL FUNCTION PANEL
Albumin: 2.1 g/dL — ABNORMAL LOW (ref 3.5–5.0)
Anion gap: 7 (ref 5–15)
BUN: 21 mg/dL (ref 8–23)
CO2: 23 mmol/L (ref 22–32)
Calcium: 9 mg/dL (ref 8.9–10.3)
Chloride: 101 mmol/L (ref 98–111)
Creatinine, Ser: 0.79 mg/dL (ref 0.44–1.00)
GFR calc Af Amer: >60 mL/min (ref >60)
GFR calc non Af Amer: >60 mL/min (ref >60)
Glucose, Bld: 169 mg/dL — ABNORMAL HIGH (ref 70–99)
Phosphorus: 3.4 mg/dL (ref 2.5–4.6)
Potassium: 4.4 mmol/L (ref 3.5–5.1)
Sodium: 131 mmol/L — ABNORMAL LOW (ref 135–145)

## 2018-05-26 LAB — GLUCOSE, CAPILLARY
Glucose-Capillary: 169 mg/dL — ABNORMAL HIGH (ref 70–99)
Glucose-Capillary: 173 mg/dL — ABNORMAL HIGH (ref 70–99)
Glucose-Capillary: 178 mg/dL — ABNORMAL HIGH (ref 70–99)
Glucose-Capillary: 209 mg/dL — ABNORMAL HIGH (ref 70–99)
Glucose-Capillary: 210 mg/dL — ABNORMAL HIGH (ref 70–99)

## 2018-05-26 LAB — MAGNESIUM: Magnesium: 1.6 mg/dL — ABNORMAL LOW (ref 1.7–2.4)

## 2018-05-26 MED ORDER — GERHARDT'S BUTT CREAM: 1.0000 "application " | TOPICAL_CREAM | Freq: Two times a day (BID) | CUTANEOUS | 5 refills | Status: DC

## 2018-05-26 MED ORDER — COLLAGENASE 250 UNIT/GM EX OINT: TOPICAL_OINTMENT | Freq: Every day | CUTANEOUS | 0 refills | Status: DC

## 2018-05-26 MED ORDER — METOPROLOL SUCCINATE ER 25 MG PO TB24: 25.0000 mg | ORAL_TABLET | Freq: Two times a day (BID) | ORAL | 5 refills | Status: DC

## 2018-05-26 MED ORDER — AMIODARONE HCL 200 MG PO TABS: 200.0000 mg | ORAL_TABLET | Freq: Two times a day (BID) | ORAL | 3 refills | Status: DC

## 2018-05-26 MED ORDER — HYDRALAZINE HCL 25 MG PO TABS: 75.0000 mg | ORAL_TABLET | Freq: Three times a day (TID) | ORAL | 5 refills | Status: DC

## 2018-05-26 MED ORDER — PANTOPRAZOLE SODIUM 40 MG PO TBEC: 40.0000 mg | DELAYED_RELEASE_TABLET | Freq: Every day | ORAL | 5 refills | Status: DC

## 2018-05-26 MED ORDER — GLUCERNA SHAKE PO LIQD: 237.0000 mL | Freq: Three times a day (TID) | ORAL | 0 refills | Status: DC

## 2018-05-26 MED ORDER — SPIRONOLACTONE 25 MG PO TABS: 12.5000 mg | ORAL_TABLET | Freq: Every day | ORAL | 5 refills | Status: DC

## 2018-05-26 MED ORDER — HYDRALAZINE HCL 25 MG PO TABS
25.0000 mg | ORAL_TABLET | Freq: Once | ORAL | Status: AC
  Administered 2018-05-26: 25 mg via ORAL
  Filled 2018-05-26: qty 1

## 2018-05-26 MED ORDER — SILDENAFIL CITRATE 20 MG PO TABS: 20.0000 mg | ORAL_TABLET | Freq: Three times a day (TID) | ORAL | 5 refills | Status: DC

## 2018-05-26 MED ORDER — FUROSEMIDE 40 MG PO TABS: 40.0000 mg | ORAL_TABLET | Freq: Two times a day (BID) | ORAL | 5 refills | Status: DC

## 2018-05-26 MED ORDER — MAGNESIUM SULFATE 2 GM/50ML IV SOLN
2.0000 g | Freq: Once | INTRAVENOUS | Status: AC
  Administered 2018-05-26: 2 g via INTRAVENOUS
  Filled 2018-05-26: qty 50

## 2018-05-26 MED ORDER — HYDRALAZINE HCL 50 MG PO TABS
75.0000 mg | ORAL_TABLET | Freq: Three times a day (TID) | ORAL | Status: DC
  Administered 2018-05-26: 75 mg via ORAL
  Filled 2018-05-26: qty 1

## 2018-05-26 MED ORDER — POTASSIUM CHLORIDE CRYS ER 20 MEQ PO TBCR: 20.0000 meq | EXTENDED_RELEASE_TABLET | Freq: Two times a day (BID) | ORAL | 5 refills | Status: DC

## 2018-05-26 MED ORDER — MAGNESIUM HYDROXIDE 400 MG/5ML PO SUSP
30.0000 mL | Freq: Once | ORAL | Status: AC
  Administered 2018-05-26: 30 mL via ORAL
  Filled 2018-05-26: qty 30

## 2018-05-26 NOTE — Progress Notes (Signed)
Physical Therapy Wound Treatment Patient Details  Name: Patricia Mata MRN: [id number] Date of Birth: Jun 07, 1956  Today's Date: 05/26/2018 Time: 0932-3557 Time Calculation (min): 37 min  Subjective  Subjective: Pt asked to see picture of her wound. Patient and Family Stated Goals: get better Date of Onset: 05/21/18  Pain Score:    Wound Assessment  Pressure Injury 05/22/18 Unstageable - Full thickness tissue loss in which the base of the ulcer is covered by slough (yellow, tan, gray, green or brown) and/or eschar (tan, brown or black) in the wound bed. (Active)  Dressing Type Moist to dry;Barrier Film (skin prep);Gauze (Comment);Foam 05/26/2018  1:26 PM  Dressing Clean;Dry;Intact;Changed 05/26/2018  1:26 PM  Dressing Change Frequency Daily 05/26/2018  1:26 PM  State of Healing Eschar 05/26/2018  1:26 PM  Site / Wound Assessment Yellow;Red;Pink 05/26/2018  1:26 PM  % Wound base Red or Granulating 15% 05/26/2018  1:26 PM  % Wound base Yellow/Fibrinous Exudate 85% 05/26/2018  1:26 PM  % Wound base Black/Eschar 0% 05/26/2018  1:26 PM  % Wound base Other/Granulation Tissue (Comment) 0% 05/26/2018  1:26 PM  Peri-wound Assessment Intact 05/26/2018  1:26 PM  Wound Length (cm) 11 cm 05/22/2018  1:00 PM  Wound Width (cm) 9 cm 05/22/2018  1:00 PM  Wound Depth (cm) 0.1 cm 05/22/2018  1:00 PM  Wound Surface Area (cm^2) 99 cm^2 05/22/2018  1:00 PM  Wound Volume (cm^3) 9.9 cm^3 05/22/2018  1:00 PM  Margins Unattached edges (unapproximated) 05/26/2018  1:26 PM  Drainage Amount Moderate 05/26/2018  1:26 PM  Drainage Description Purulent 05/26/2018  1:26 PM  Treatment Debridement (Selective);Hydrotherapy (Pulse lavage);Packing (Saline gauze) 05/26/2018  1:26 PM   Santyl applied to wound bed prior to applying dressing.    Hydrotherapy Pulsed lavage therapy - wound location: sacrum Pulsed Lavage with Suction (psi): 8 psi Pulsed Lavage with Suction - Normal Saline Used: 1000 mL Pulsed Lavage Tip: Tip  with splash shield Selective Debridement Selective Debridement - Location: sacrum Selective Debridement - Tools Used: Scissors;Forceps Selective Debridement - Tissue Removed: yellow necrotic tissue   Wound Assessment and Plan  Wound Therapy - Assess/Plan/Recommendations Wound Therapy - Clinical Statement: Continue to remove some necrotic tissue and beginning to get some depth to the superior portion of the wound. Pt appeared comfortable during the treatment. No flinching or grimacing.  Wound Therapy - Functional Problem List: Decr mobility and sitting tolerance Factors Delaying/Impairing Wound Healing: Immobility;Multiple medical problems;Polypharmacy;Diabetes Mellitus Hydrotherapy Plan: Debridement;Dressing change;Patient/family education;Pulsatile lavage with suction Wound Therapy - Frequency: 6X / week Wound Therapy - Follow Up Recommendations: Skilled nursing facility Wound Plan: See above  Wound Therapy Goals- Improve the function of patient's integumentary system by progressing the wound(s) through the phases of wound healing (inflammation - proliferation - remodeling) by: Decrease Necrotic Tissue to: 80 Decrease Necrotic Tissue - Progress: Progressing toward goal Increase Granulation Tissue to: 20 Increase Granulation Tissue - Progress: Progressing toward goal  Goals will be updated until maximal potential achieved or discharge criteria met.  Discharge criteria: when goals achieved, discharge from hospital, MD decision/surgical intervention, no progress towards goals, refusal/missing three consecutive treatments without notification or medical reason.  GP     Shary Decamp [place] 05/26/2018, 2:19 PM [place] Pager [phone] Office [phone]

## 2018-05-26 NOTE — Clinical Social Work Placement (Signed)
   CLINICAL SOCIAL WORK PLACEMENT  NOTE  Date:  05/26/2018  Patient Details  Name: Patricia Mata MRN: [id number] Date of Birth: [birth year]  Clinical Social Work is seeking post-discharge placement for this patient at the [place] level of care (*CSW will initial, date and re-position this form in  chart as items are completed):  Yes   Patient/family provided with [facility] Work Department's list of facilities offering this level of care within the geographic area requested by the patient (or if unable, by the patient's family).  Yes   Patient/family informed of their freedom to choose among providers that offer the needed level of care, that participate in Medicare, Medicaid or managed care program needed by the patient, have an available bed and are willing to accept the patient.  Yes   Patient/family informed of [facility]'s ownership interest in [facility] and [facility], as well as of the fact that they are under no obligation to receive care at these facilities.  PASRR submitted to EDS on 05/25/18     PASRR number received on 05/25/18     Existing PASRR number confirmed on       FL2 transmitted to all facilities in geographic area requested by pt/family on 05/25/18     FL2 transmitted to all facilities within larger geographic area on       Patient informed that his/her managed care company has contracts with or will negotiate with certain facilities, including the following:  [facility]     Yes   Patient/family informed of bed offers received.  Patient chooses bed at [facility]     Physician recommends and patient chooses bed at      Patient to be transferred to [facility] on 05/26/18.  Patient to be transferred to facility by [facility]     Patient family notified on 05/26/18 of transfer.  Name of family member notified:  Sherrice Creekmore, spouse     PHYSICIAN Please prepare priority discharge  summary, including medications, Please sign DNR, Please prepare prescriptions     Additional Comment:    _______________________________________________ Estanislado Emms, LCSW 05/26/2018, 3:22 PM

## 2018-05-26 NOTE — Progress Notes (Signed)
Instructed patient on procedure. HOB less than 45*. Pressure held to site, no s/sx of bleeding. Pressure drsg applied and instructed patient to remain in bed for 53min, keep drsg CDI for 24 hours. Monitor and report any s/sx of bleeding. PT/SO VU. Fran Lowes, RN VAST

## 2018-05-26 NOTE — Progress Notes (Signed)
Report called and given to [facility] healthcare.

## 2018-05-26 NOTE — Discharge Summary (Addendum)
Advanced Heart Failure Discharge Note  Discharge Summary   Patient ID: Patricia Mata MRN: [id number], DOB/AGE: Feb 26, 1956 62 y.o. Admit date: 05/11/2018 D/C date:     05/26/2018   Primary Discharge Diagnoses:  1. PAH - On sildenafil 20 mg TID 2. Moderate AS/MS 3 PAF - On amio 200 mg BID - Not on AC with anemia requiring transfusions 4. AKI 5. 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 Buttock wound 7. HTN 8. DNR/DNI 9. Acute/chronic diastolic HF 10. Acute hypoxic respiratory failure  Hospital Course: Patricia Mata is a 62 y.o. female with a history of obesity, DM2, severe HTN, macrocytic anemia, stage 3 colon cancer DX in 2008 s/p hemicolectomy and myelodysplastic syndrome- dx 01/2016 with transfusion dependent anemia. Followed by Dr Gwenlyn Found for "severe" AS and pulmonary HTN.  Admitted on 10/27 with AF with RVR and recurrent anemia. Developed respiratory failure and intubated. She was transfused and placed on IV amio. Subsequently developed AKI/ESRD and placed on CVVHD. Converted to NSR on amio and weaned off the vent. Renal function now back to normal and off CVVHD. Transitioned to PO amiodarone after appropriate time. She was not put on [facility] due to chronic anemia requiring transfusions.  Echo showed normal LVEF 65-70% with moderate AS and moderate MS and severe PH with RVSP 21mmHG. She is not a candidate for intervention of AS or MS with advanced PAH.  Underwent R/LHC that showed normal coronaries and severely elevated PA pressures. Full results below. AHF team was consulted for further management of Patricia Mata. V/Q scan was negative. She was started on sildenafil and tolerated well. She diuresed with IV lasix and transitioned to lasix 40 mg PO BID. She did not require O2 at discharge.   She required a total of 9 units of pRBCs while admitted. She requires transfusions every 2-3 weeks as an outpatient. She also developed a buttock wound. WOC was consulted and  recommended hydrotherapy.   Palliative care was consulted and decision was made to transition to DNR. Yellow DNR form signed for DC. PT and OT recommended SNF at discharge. She will have palliative at SNF with possible transition to hospice eventually.  HF follow up has been scheduled, with appointment as below. She will need a BMET and EKG at follow up.   Discharge Weight Range: 207 lbs Discharge Vitals: Blood pressure (!) 180/81, pulse 79, temperature 99.4 F (37.4 C), temperature source Oral, resp. rate 20, height 5\' 4"  (1.626 m), weight 93.8 kg, SpO2 99 %.  Labs: Lab Results  Component Value Date   WBC 7.6 05/26/2018   HGB 9.2 (L) 05/26/2018   HCT 29.0 (L) 05/26/2018   MCV 90.6 05/26/2018   PLT 282 05/26/2018    Recent Labs  Lab 05/22/18 0243  05/26/18 0520  NA 139   < > 131*  K 3.6   < > 4.4  CL 108   < > 101  CO2 24   < > 23  BUN 27*   < > 21  CREATININE 0.83   < > 0.79  CALCIUM 8.9   < > 9.0  PROT 6.0*  --   --   BILITOT 4.1*  --   --   ALKPHOS 106  --   --   ALT 42  --   --   AST 42*  --   --   GLUCOSE 117*   < > 169*   < > = values in this interval not displayed.   No results found for:  CHOL, HDL, LDLCALC, TRIG BNP (last 3 results) Recent Labs    05/11/18 0650 05/13/18 0333  BNP 2,681.1* 3,571.3*    ProBNP (last 3 results) No results for input(s): PROBNP in the last 8760 hours.   Diagnostic Studies/Procedures   V/Q scan 05/22/18: Normal pulmonary perfusion. No evidence for acute or chronic pulmonary embolus  [facility] 05/21/18  - No angiographically significant CAD.  RA (mean): 14 mmHg RV (S/EDP): 110/15 mmHg PA (S/D, mean): 105/40 (62) mmHg PCWP (mean): 25 mmHg  Ao sat: 91% PA sat: 53%  Fick CO: 6.8 L/min Fick CI: 4.3 L/min/m^2  PVR: 5.4 Wood units  Echo 05/12/18 - Left ventricle: The cavity size was normal. There was moderate   concentric hypertrophy. Systolic function was vigorous. The   estimated ejection fraction was in the range of  65% to 70%. Wall   motion was normal; there were no regional wall motion   abnormalities. Features are consistent with a pseudonormal left   ventricular filling pattern, with concomitant abnormal relaxation   and increased filling pressure (grade 2 diastolic dysfunction).   Doppler parameters are consistent with high ventricular filling   pressure. - Aortic valve: Trileaflet; moderately thickened, moderately   calcified leaflets. Valve mobility was restricted. There was   moderate stenosis. Mean gradient (S): 22 mm Hg. VTI ratio of LVOT   to aortic valve: 0.37. Valve area (VTI): 1.28 cm^2. Valve area   (Vmax): 1.01 cm^2. Valve area (Vmean): 1.18 cm^2. - Mitral valve: Calcified annulus. Moderate diffuse thickening,   calcification, and elongation. The findings are consistent with   mild to moderate stenosis. Deceleration time: 324 ms. - Tricuspid valve: There was moderate regurgitation. - Pulmonic valve: There was trivial regurgitation. - Pulmonary arteries: PA peak pressure: 93 mm Hg (S).  Discharge Medications   Allergies as of 05/26/2018      Reactions   Ancef [cefazolin] Itching   Severe itching- after procedure, ancef was the antibiotic.-04/02/17   Sulfa Antibiotics Rash, Other (See Comments)   Blisters, also      Medication List    STOP taking these medications   colchicine 0.6 MG tablet   EDARBYCLOR 40-12.5 MG Tabs Generic drug:  Azilsartan-Chlorthalidone   megestrol 400 MG/10ML suspension Commonly known as:  MEGACE   metoprolol tartrate 50 MG tablet Commonly known as:  LOPRESSOR   prochlorperazine 10 MG tablet Commonly known as:  COMPAZINE     TAKE these medications   amiodarone 200 MG tablet Commonly known as:  PACERONE Take 1 tablet (200 mg total) by mouth 2 (two) times daily.   atorvastatin 40 MG tablet Commonly known as:  LIPITOR Take 1 tablet (40 mg total) by mouth daily.   CENTRUM SILVER 50+WOMEN Tabs Take 1 tablet by mouth daily.   collagenase  ointment Commonly known as:  SANTYL Apply topically daily. Start taking on:  05/27/2018   feeding supplement (GLUCERNA SHAKE) Liqd Take 237 mLs by mouth 3 (three) times daily between meals.   furosemide 40 MG tablet Commonly known as:  LASIX Take 1 tablet (40 mg total) by mouth 2 (two) times daily. What changed:    medication strength  how much to take  when to take this   Gerhardt's butt cream Crea Apply 1 application topically 2 (two) times daily.   hydrALAZINE 25 MG tablet Commonly known as:  APRESOLINE Take 3 tablets (75 mg total) by mouth every 8 (eight) hours. What changed:    how much to take  when to take this  insulin glargine 100 unit/mL Sopn Commonly known as:  LANTUS Inject 0.14 mLs (14 Units total) into the skin daily after breakfast. What changed:  how much to take   Insulin Pen Needle 29G X 5MM Misc Use as directed   JANUMET XR 50-500 MG Tb24 Generic drug:  SitaGLIPtin-MetFORMIN HCl Take 1 tablet by mouth daily. With evening meal   latanoprost 0.005 % ophthalmic solution Commonly known as:  XALATAN Place 1 drop into both eyes at bedtime.   lidocaine-prilocaine cream Commonly known as:  EMLA Apply a quarter size of cream to port 1-2 hours prior to access. Cover with saran wrap.   metoprolol succinate 25 MG 24 hr tablet Commonly known as:  TOPROL-XL Take 1 tablet (25 mg total) by mouth 2 (two) times daily.   pantoprazole 40 MG tablet Commonly known as:  PROTONIX Take 1 tablet (40 mg total) by mouth daily. Start taking on:  05/27/2018   potassium chloride SA 20 MEQ tablet Commonly known as:  K-DUR,KLOR-CON Take 1 tablet (20 mEq total) by mouth 2 (two) times daily.   sildenafil 20 MG tablet Commonly known as:  REVATIO Take 1 tablet (20 mg total) by mouth 3 (three) times daily.   spironolactone 25 MG tablet Commonly known as:  ALDACTONE Take 0.5 tablets (12.5 mg total) by mouth daily. Start taking on:  05/27/2018       Disposition    The patient will be discharged in stable condition to SNF. Discharge Instructions    Complete patient signature process for consent form   Complete by:  May 18, 2018    Practitioner attestation of consent   Complete by:  May 18, 2018    I, the ordering practitioner, attest that I have discussed with the patient the benefits, risks, side effects, alternatives, likelihood of achieving goals and potential problems during recovery for the procedure listed.   Procedure:  Blood Product(s)   (HEART FAILURE PATIENTS) Call MD:  Anytime you have any of the following symptoms: 1) 3 pound weight gain in 24 hours or 5 pounds in 1 week 2) shortness of breath, with or without a dry hacking cough 3) swelling in the hands, feet or stomach 4) if you have to sleep on extra pillows at night in order to breathe.   Complete by:  As directed    Amb referral to AFIB Clinic   Complete by:  As directed    Call MD for:  persistant dizziness or light-headedness   Complete by:  As directed    Care order/instruction   Complete by:  As directed    Transfuse Parameters   Diet - low sodium heart healthy   Complete by:  As directed    Heart Failure patients record your daily weight using the same scale at the same time of day   Complete by:  As directed    Increase activity slowly   Complete by:  As directed       Contact information for follow-up providers    [facility] on 06/04/2018.   Specialty:  Cardiology Why:  at 2:30pm in the Advanced Heart Failure Clinic--please bring all medications to appt.  Gate code for November is 1800. Contact information: [address] [id number] [place] [phone]           Contact information for after-discharge care    Destination    [facility] Preferred SNF .   Service:  Skilled  Nursing Contact information: [address] [place]  [place] [phone]                    Duration of Discharge Encounter: Greater than 35 minutes   Signed, Georgiana Shore, NP 05/26/2018, 3:43 PM   Patient seen and examined with the above-signed Advanced Practice Provider and/or Housestaff. I personally reviewed laboratory data, imaging studies and relevant notes. I independently examined the patient and formulated the important aspects of the plan. I have edited the note to reflect any of my changes or salient points. I have personally discussed the plan with the patient and/or family.  She is ready for d/c to SNF on above meds. Will follow closely in HF Clinic.  Glori Bickers, MD  6:43 PM

## 2018-05-26 NOTE — Progress Notes (Signed)
[facility] has insurance authorization for patient to admit to SNF today. CSW to follow and support with discharge.  Estanislado Emms, [place]

## 2018-05-26 NOTE — Progress Notes (Signed)
Physical Therapy Treatment Patient Details Name: Patricia Mata MRN: [id number] DOB: 08-04-1955 Today's Date: 05/26/2018    History of Present Illness Pt is a 62 y.o. female admitted 05/11/18 with dyspnea. Found to have a-fib with RVR, acute on chronic anemia with AKI. Developed severe metabolic acidosis and shock requiring ETT 10/29-11/4; also with VDRF and renal failure requiring temporary CVVHD. Heart cath 11/7 showed no significant CAD; severe pulmonary HTN. Pt also with unstageable sacral pressure injury. PMH includes severe AS, severe TR, pulmonary HTN, stage 3 colon CA and myelopdysplastic syndrome.   PT Comments    Pt progressing with mobility. Able to stand with RW and perform stand pivot with maxA+1; pt initiating steps with BLEs and maxA, but unable to clear either foot completely yet secondary to weakness. Pt motivated to participate and regain function. Continue to recommend SNF-level therapies to maximize functional mobility and independence.   Follow Up Recommendations  SNF;Supervision for mobility/OOB     Equipment Recommendations  (TBD next venue)    Recommendations for Other Services       Precautions / Restrictions Precautions Precautions: Fall Precaution Comments: Sacral wound Restrictions Weight Bearing Restrictions: No    Mobility  Bed Mobility Overal bed mobility: Needs Assistance Bed Mobility: Supine to Sit     Supine to sit: Mod assist;HOB elevated     General bed mobility comments: Good ability to bring BLEs to EOB; modA for trunk elevation. Able to scoot hips towards EOB without physical assist, requires intermittent cues for sequencing  Transfers Overall transfer level: Needs assistance Equipment used: Rolling walker (2 wheeled);1 person hand held assist Transfers: Sit to/from Stand;Stand Pivot Transfers Sit to Stand: Max assist;From elevated surface Stand pivot transfers: Max assist;From elevated surface       General transfer comment:  Perform initial stand from EOB with maxA and RW; required cues for correct hand placement. Require seated rest; second trial with single UE support and stand pivot to recliner, maxA to maintain trunk elevation and prevent posterior LOB during pivot  Ambulation/Gait             General Gait Details: Pt able to initiate scooting both feet during stand pivot to recliner, but not completely clearing either foot. MaxA for support during this   Stairs             Wheelchair Mobility    Modified Rankin (Stroke Patients Only)       Balance Overall balance assessment: Needs assistance Sitting-balance support: No upper extremity supported;Feet supported Sitting balance-Leahy Scale: Fair     Standing balance support: During functional activity;Bilateral upper extremity supported Standing balance-Leahy Scale: Poor Standing balance comment: Reliant on UE support and external assist                            Cognition Arousal/Alertness: Awake/alert Behavior During Therapy: Flat affect Overall Cognitive Status: No family/caregiver present to determine baseline cognitive functioning Area of Impairment: Attention;Following commands;Safety/judgement;Awareness;Problem solving                   Current Attention Level: Selective   Following Commands: Follows one step commands with increased time;Follows one step commands consistently Safety/Judgement: Decreased awareness of safety;Decreased awareness of deficits Awareness: Emergent Problem Solving: Slow processing;Requires verbal cues;Requires tactile cues;Decreased initiation;Difficulty sequencing        Exercises      General Comments        Pertinent Vitals/Pain Pain Assessment: Faces Faces Pain Scale:  Hurts even more Pain Location: Sacrum when coming to sit Pain Descriptors / Indicators: Grimacing;Discomfort Pain Intervention(s): Monitored during session;Repositioned    Home Living                       Prior Function            PT Goals (current goals can now be found in the care plan section) Acute Rehab PT Goals Patient Stated Goal: "To walk again" PT Goal Formulation: With patient/family Time For Goal Achievement: 06/02/18 Potential to Achieve Goals: Fair Progress towards PT goals: Progressing toward goals    Frequency    Min 2X/week      PT Plan Current plan remains appropriate    Co-evaluation              AM-PAC PT "6 Clicks" Daily Activity  Outcome Measure  Difficulty turning over in bed (including adjusting bedclothes, sheets and blankets)?: Unable Difficulty moving from lying on back to sitting on the side of the bed? : Unable Difficulty sitting down on and standing up from a chair with arms (e.g., wheelchair, bedside commode, etc,.)?: Unable Help needed moving to and from a bed to chair (including a wheelchair)?: A Lot Help needed walking in hospital room?: Total Help needed climbing 3-5 steps with a railing? : Total 6 Click Score: 7    End of Session Equipment Utilized During Treatment: Gait belt Activity Tolerance: Patient tolerated treatment well Patient left: in chair;with call bell/phone within reach;with chair alarm set;with nursing/sitter in room Nurse Communication: Mobility status PT Visit Diagnosis: Difficulty in walking, not elsewhere classified (R26.2);Muscle weakness (generalized) (M62.81)     Time: 0071-2197 PT Time Calculation (min) (ACUTE ONLY): 24 min  Charges:  $Therapeutic Activity: 23-37 mins                     Mabeline Caras, PT, DPT Acute Rehabilitation Services  Pager [phone] Office [place] 05/26/2018, 11:36 AM

## 2018-05-26 NOTE — Progress Notes (Signed)
Patient will discharge to: [place] Anticipated discharge date: 05/26/2018 Family notified: Robley Fries, Spouse Transportation by: Corey Harold Nurse to call report to: [phone] Patient room at facility: [place] Work Signing Off  Arlis Porta, Social Work [place]

## 2018-05-26 NOTE — Progress Notes (Addendum)
Patient ID: Patricia Mata, female   DOB: 05/25/1956, 62 y.o.   MRN: [id number]     Advanced Heart Failure Rounding Note  PCP-Cardiologist: Quay Burow, MD   Subjective:    + 260 cc with 2 uPRBCs yesterday. Hgb up to 9.2. CVP 6-7  Feeling OK this am. Family would like patient to stay in hospital to continue "therapy". Discussed with patient that SNF would be best option for her for continued therapy, as her medical issues have improved.   Remains in NSR on amio gtt.   [facility] 05/21/18  - No angiographically significant CAD.  RA (mean): 14 mmHg RV (S/EDP): 110/15 mmHg PA (S/D, mean): 105/40 (62) mmHg PCWP (mean): 25 mmHg  Ao sat: 91% PA sat: 53%  Fick CO: 6.8 L/min Fick CI: 4.3 L/min/m^2  PVR: 5.4 Wood units  Objective:   Weight Range: 93.8 kg Body mass index is 35.51 kg/m.   Vital Signs:   Temp:  [98 F (36.7 C)-99.9 F (37.7 C)] 99.4 F (37.4 C) (11/12 0458) Pulse Rate:  [69-77] 72 (11/12 0843) Resp:  [9-26] 17 (11/12 0843) BP: (123-169)/(41-99) 157/56 (11/12 0843) SpO2:  [95 %-100 %] 98 % (11/12 0843) Weight:  [93.8 kg] 93.8 kg (11/12 0522) Last BM Date: (P) 05/16/18  Weight change: Filed Weights   05/24/18 0634 05/25/18 0500 05/26/18 0522  Weight: 94 kg 94.6 kg 93.8 kg   Intake/Output:   Intake/Output Summary (Last 24 hours) at 05/26/2018 0852 Last data filed at 05/26/2018 0150 Gross per 24 hour  Intake 960 ml  Output 700 ml  Net 260 ml    Physical Exam    General: elderly and chronically ill appearing. No resp difficulty. HEENT: Normal anicteric Neck: Supple. JVP 7-8 cm. Carotids 2+ bilat; no bruits. No thyromegaly or nodule noted. Cor: PMI nondisplaced. RRR, 3/6 systolic murmur along the sternal border.  2/6 TR Lungs: CTAB, normal effort. Abdomen: Obese Soft, non-tender, non-distended, no HSM. No bruits or masses. +BS  Extremities: No cyanosis, clubbing, or rash. Trace ankle edema.  Neuro: Alert & orientedx3, cranial nerves grossly intact.  moves all 4 extremities w/o difficulty. Affect pleasant   Telemetry   NSR 70-80s, personally reviewed.   Labs    CBC Recent Labs    05/25/18 1018 05/26/18 0720  WBC 6.9 7.6  NEUTROABS 5.2  --   HGB 7.7* 9.2*  HCT 23.3* 29.0*  MCV 90.3 90.6  PLT 261 188   Basic Metabolic Panel Recent Labs    05/25/18 0541 05/26/18 0520  NA 135 131*  K 4.5 4.4  CL 104 101  CO2 25 23  GLUCOSE 129* 169*  BUN 20 21  CREATININE 0.80 0.79  CALCIUM 9.0 9.0  MG 1.2* 1.6*  PHOS 3.5 3.4   Liver Function Tests Recent Labs    05/25/18 0541 05/26/18 0520  ALBUMIN 2.0* 2.1*   No results for input(s): LIPASE, AMYLASE in the last 72 hours. Cardiac Enzymes No results for input(s): CKTOTAL, CKMB, CKMBINDEX, TROPONINI in the last 72 hours.  BNP: BNP (last 3 results) Recent Labs    05/11/18 0650 05/13/18 0333  BNP 2,681.1* 3,571.3*    ProBNP (last 3 results) No results for input(s): PROBNP in the last 8760 hours.   D-Dimer No results for input(s): DDIMER in the last 72 hours. Hemoglobin A1C No results for input(s): HGBA1C in the last 72 hours. Fasting Lipid Panel No results for input(s): CHOL, HDL, LDLCALC, TRIG, CHOLHDL, LDLDIRECT in the last 72 hours. Thyroid Function Tests No  results for input(s): TSH, T4TOTAL, T3FREE, THYROIDAB in the last 72 hours.  Invalid input(s): FREET3  Other results:  Imaging   No results found.  Medications:    Scheduled Medications: . sodium chloride   Intravenous Once  . amiodarone  200 mg Oral BID  . collagenase   Topical Daily  . diphenhydrAMINE  25 mg Oral Once  . feeding supplement (GLUCERNA SHAKE)  237 mL Oral TID BM  . feeding supplement (PRO-STAT SUGAR FREE 64)  30 mL Oral BID  . furosemide  40 mg Oral BID  . Gerhardt's butt cream   Topical BID  . hydrALAZINE  50 mg Oral Q8H  . insulin glargine  10 Units Subcutaneous Daily  . latanoprost  1 drop Both Eyes QHS  . mouth rinse  15 mL Mouth Rinse BID  . metoprolol succinate  25  mg Oral BID  . pantoprazole  40 mg Oral Daily  . potassium chloride  20 mEq Oral BID  . sildenafil  20 mg Oral TID  . sodium chloride flush  10-40 mL Intracatheter Q12H  . spironolactone  12.5 mg Oral Daily    Infusions: . sodium chloride 10 mL/hr at 05/18/18 0800    PRN Medications: sodium chloride, acetaminophen, hydrALAZINE, levalbuterol, metoprolol tartrate, ondansetron (ZOFRAN) IV, sodium chloride flush  Patient Profile   62 y/o woman with multiple medical problems including severe PAH, moderate AS, transfusion dependent anemia admitted with shock in setting of new-onset AF. Course c/b VDRF and renal failure requiring temporary CVVHD  Assessment/Plan   1. PAH - Echo 05/12/18 LVEF 65-70%, Grade 2 DD, Mod AS, Mild/Mod MS, Mod TR, Trivial PI, PA peak pressure 93.  - L/RHC 05/21/18 with no significant CAD, severe pulmonary HTN with PAP 62 mmHg, PVR 5.4 WU.  - Severe, likely longstanding with NYHAI IIIB-IV - Suspect multifactorial in nature suspect mostly WHO group II (diastolic HF and valvular disease) and III (OSA) but given degree of PH cannot exclude WHO Group I component. PVR 5.4 WU - She is likely end stage. Will focus on volume and symptom management.  - VQ negative - PFTs and assess need for home O2 - Continue sildenafil 20 mg TID cautiously. Tolerating ok - Volume status OK on exam. CVP 6-7 cm  - Continue lasix 40 mg BID.  - Appreciate Palliative Care. Plan for now is to discharge to rehab with palliative care. Will eventually transition to hospice.   2. Moderate AS/MS - She is not candidate for intervention with advanced PAH. No change.   3. PAF - Remains in NSR on amio. Did not tolerate AF well at all. - Continue amiodarone 200 mg BID.  - Continue Toprol XL 25 mg BID as tolerated.  - Would try and avoid anticoagulation with transfusion dependent anemia. (chronic).  - CHA2DS2/VASc is at least 4.   4. AKI - Resolved. Follow.   5. Transfusion dependent anemia  in setting of Myelodysplastic syndrome - Transfuse as needed. Total of 7 uPRBCs given since 05/11/18. - Hgb 7.4  -> 9.2 s/p 2 u PRBCs.  - Requires transfusions every 2-3 weeks as outpatient. Failed Aranesp therapy.   6. Buttock wound - WOC consulted. Plan hydrotherapy - Continue PT. Plan for SNF on discharge.   7. HTN - BP remains elevated into 150-160s - Increase hydralazine to 75 mg TID.   8. Prognosis - Long talk with pt and family about disease state education and prognosis on 11/7.  - Plan for now for SNF with palliative  services, possible transition to hospice eventually.  She is DNR/DNI.   For SNF once bed available.   Length of Stay: [place], [place]  05/26/2018, 8:52 AM  Advanced Heart Failure Team Pager [phone] (M-F; 7a - 4p)  Please contact [facility] for night-coverage after hours (4p -7a ) and weekends on [email or web address]  Patient seen and examined with the above-signed Advanced Practice Provider and/or Housestaff. I personally reviewed laboratory data, imaging studies and relevant notes. I independently examined the patient and formulated the important aspects of the plan. I have edited the note to reflect any of my changes or salient points. I have personally discussed the plan with the patient and/or family.  Much improved this am after diuresis and RBC transfusion. BP up. Will increase hydralazine. Ready for d/c for [facility].   Glori Bickers, MD  11:02 AM

## 2018-05-27 ENCOUNTER — Other Ambulatory Visit (HOSPITAL_COMMUNITY): Payer: BC Managed Care – PPO

## 2018-05-27 ENCOUNTER — Telehealth: Payer: Self-pay

## 2018-05-27 NOTE — Telephone Encounter (Signed)
Received a call from the patient sister stating that the patient has been dc'd from the hospital to [facility] for rehab. She requested the patient next appointment date and time so the facility can transport. Appointment information was provided.

## 2018-05-28 ENCOUNTER — Telehealth (HOSPITAL_COMMUNITY): Payer: Self-pay | Admitting: *Deleted

## 2018-05-28 NOTE — Telephone Encounter (Signed)
Msg left on machine for a clbk to sched appt from hospital referral

## 2018-06-01 ENCOUNTER — Inpatient Hospital Stay (HOSPITAL_BASED_OUTPATIENT_CLINIC_OR_DEPARTMENT_OTHER): Payer: BC Managed Care – PPO | Admitting: Oncology

## 2018-06-01 ENCOUNTER — Inpatient Hospital Stay: Payer: BC Managed Care – PPO

## 2018-06-01 ENCOUNTER — Telehealth: Payer: Self-pay | Admitting: Oncology

## 2018-06-01 VITALS — BP 155/52 | HR 62 | Temp 97.7°F | Resp 17 | Ht 64.0 in

## 2018-06-01 DIAGNOSIS — Z85038 Personal history of other malignant neoplasm of large intestine: Secondary | ICD-10-CM | POA: Diagnosis not present

## 2018-06-01 DIAGNOSIS — D631 Anemia in chronic kidney disease: Secondary | ICD-10-CM

## 2018-06-01 DIAGNOSIS — D469 Myelodysplastic syndrome, unspecified: Secondary | ICD-10-CM

## 2018-06-01 DIAGNOSIS — Z79899 Other long term (current) drug therapy: Secondary | ICD-10-CM | POA: Diagnosis not present

## 2018-06-01 DIAGNOSIS — K5900 Constipation, unspecified: Secondary | ICD-10-CM

## 2018-06-01 DIAGNOSIS — Z9049 Acquired absence of other specified parts of digestive tract: Secondary | ICD-10-CM

## 2018-06-01 DIAGNOSIS — N189 Chronic kidney disease, unspecified: Secondary | ICD-10-CM

## 2018-06-01 DIAGNOSIS — I272 Pulmonary hypertension, unspecified: Secondary | ICD-10-CM

## 2018-06-01 DIAGNOSIS — Z9221 Personal history of antineoplastic chemotherapy: Secondary | ICD-10-CM

## 2018-06-01 DIAGNOSIS — I4891 Unspecified atrial fibrillation: Secondary | ICD-10-CM | POA: Diagnosis not present

## 2018-06-01 LAB — CBC WITH DIFFERENTIAL (CANCER CENTER ONLY)
Abs Immature Granulocytes: 0.06 10*3/uL (ref 0.00–0.07)
Basophils Absolute: 0 10*3/uL (ref 0.0–0.1)
Basophils Relative: 0 %
Eosinophils Absolute: 0.1 10*3/uL (ref 0.0–0.5)
Eosinophils Relative: 1 %
HCT: 29.3 % — ABNORMAL LOW (ref 36.0–46.0)
Hemoglobin: 9.5 g/dL — ABNORMAL LOW (ref 12.0–15.0)
Immature Granulocytes: 1 %
Lymphocytes Relative: 8 %
Lymphs Abs: 0.8 10*3/uL (ref 0.7–4.0)
MCH: 30.4 pg (ref 26.0–34.0)
MCHC: 32.4 g/dL (ref 30.0–36.0)
MCV: 93.6 fL (ref 80.0–100.0)
Monocytes Absolute: 2 10*3/uL — ABNORMAL HIGH (ref 0.1–1.0)
Monocytes Relative: 22 %
Neutro Abs: 6.2 10*3/uL (ref 1.7–7.7)
Neutrophils Relative %: 68 %
Platelet Count: 405 10*3/uL — ABNORMAL HIGH (ref 150–400)
RBC: 3.13 MIL/uL — ABNORMAL LOW (ref 3.87–5.11)
RDW: 18.1 % — ABNORMAL HIGH (ref 11.5–15.5)
WBC Count: 9.1 10*3/uL (ref 4.0–10.5)
nRBC: 0 % (ref 0.0–0.2)

## 2018-06-01 LAB — CMP (CANCER CENTER ONLY)
ALT: 34 U/L (ref 0–44)
AST: 46 U/L — ABNORMAL HIGH (ref 15–41)
Albumin: 2.6 g/dL — ABNORMAL LOW (ref 3.5–5.0)
Alkaline Phosphatase: 170 U/L — ABNORMAL HIGH (ref 38–126)
Anion gap: 9 (ref 5–15)
BUN: 31 mg/dL — ABNORMAL HIGH (ref 8–23)
CO2: 22 mmol/L (ref 22–32)
Calcium: 9.9 mg/dL (ref 8.9–10.3)
Chloride: 104 mmol/L (ref 98–111)
Creatinine: 1.27 mg/dL — ABNORMAL HIGH (ref 0.44–1.00)
GFR, Est AFR Am: 51 mL/min — ABNORMAL LOW (ref >60)
GFR, Estimated: 44 mL/min — ABNORMAL LOW (ref >60)
Glucose, Bld: 182 mg/dL — ABNORMAL HIGH (ref 70–99)
Potassium: 5.3 mmol/L — ABNORMAL HIGH (ref 3.5–5.1)
Sodium: 135 mmol/L (ref 135–145)
Total Bilirubin: 2.3 mg/dL — ABNORMAL HIGH (ref 0.3–1.2)
Total Protein: 7.5 g/dL (ref 6.5–8.1)

## 2018-06-01 LAB — SAMPLE TO BLOOD BANK

## 2018-06-01 NOTE — Telephone Encounter (Signed)
Pt sched per her request due to appt times. Gave pt avs and calendar

## 2018-06-01 NOTE — Progress Notes (Signed)
Infusion appointment cancelled due to no need for PRBC's with Hgb 9.5 per Dr. Alen Blew. Communicated cancellation to [employer] in infusion.

## 2018-06-01 NOTE — Progress Notes (Signed)
Hematology and Oncology Follow Up Visit  Patricia Mata [id number] June 14, 1956 62 y.o. 06/01/2018 10:28 AM   Principle Diagnosis: 62 year old woman with:   1.Colon cancer diagnosed in 2008.  She was found to have stage III disease at the time of diagnosis and remains without any relapse.    2. 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  She was found to have IPSS-R score was 2.5.    Prior Therapy: She is S/P hemicolectomy done in 11/2006. 1/13 lymph nodes are positive.  This was followed by 12 cycles of FOLFOX completed in 05/2007  Aranesp 300 mcg every 2 to 3 weeks therapy was ineffective to eliminate transfusion needs.  5-azacytidine at 75 mg/m square subcutaneously started in October 2018.  She is status post 7 cycles of therapy completed in April 2019.   Current therapy: Supportive transfusions.     Interim History:  Patricia Mata presents today for repeat evaluation.  Since last visit, she had a prolonged hospitalization in the end of October and was discharged on May 26, 2018.  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  Since her discharge, she has been residing in a skilled nursing facility and getting physical therapy.  She has noticed significant improvements in her exercise tolerance and currently ambulating with the help of a walker.  She does report dyspnea on exertion but her exercise tolerance has improved.  She denies any lower extremity edema or cough.  She does report some constipation.  She denied headaches blurry vision, syncope or seizures.  She denies any confusion or lethargy.  She does not report any fevers, chills sweats. She does not report any chest pain, palpitation orthopnea. She does not report any cough, wheezing or hemoptysis. She does not report any nausea, vomiting or abdominal pain.  She denies any diarrhea.  She does not report any frequency urgency or  hesitancy. She does not report any paralysis or myalgias.  She denies any myosis or petechiae.  She denies any skin rash or pruritus.  She denies any mood changes.  Remaining review of systems is negative.   Medications: I have reviewed the patient's current medications.   Allergies:  Allergies  Allergen Reactions  . Ancef [Cefazolin] Itching    Severe itching- after procedure, ancef was the antibiotic.-04/02/17  . Sulfa Antibiotics Rash and Other (See Comments)    Blisters, also     Physical Exam:  Blood pressure (!) 155/52, pulse 62, temperature 97.7 F (36.5 C), temperature source Oral, resp. rate 17, height 5\' 4"  (1.626 m), SpO2 99 %.      ECOG: 2   General appearance: Comfortable appearing without any discomfort Head: Normocephalic without any trauma Oropharynx: Mucous membranes are moist and pink without any thrush or ulcers. Eyes: Pupils are equal and round reactive to light. Lymph nodes: No cervical, supraclavicular, inguinal or axillary lymphadenopathy.   Heart:regular rate and rhythm.  S1 and S2 without leg edema.  Murmur auscultated. Lung: Clear without any rhonchi or wheezes.  No dullness to percussion. Abdomin: Soft, nontender, nondistended with good bowel sounds.  No hepatosplenomegaly. Musculoskeletal: No joint deformity or effusion.  Full range of motion noted. Neurological: No deficits noted on motor, sensory and deep tendon reflex exam. Skin: No petechial rash or dryness.  Appeared moist.  Psychiatric: Mood and affect appeared appropriate.         Lab Results: Lab Results  Component Value Date  WBC 7.6 05/26/2018   HGB 9.2 (L) 05/26/2018   HCT 29.0 (L) 05/26/2018   MCV 90.6 05/26/2018   PLT 282 05/26/2018     Chemistry      Component Value Date/Time   NA 131 (L) 05/26/2018 0520   NA 132 (L) 06/10/2017 1028   NA 134 (L) 05/26/2017 0949   K 4.4 05/26/2018 0520   K 4.0 05/26/2017 0949   CL 101 05/26/2018 0520   CL 96 (L) 03/31/2012 1513    CO2 23 05/26/2018 0520   CO2 24 05/26/2017 0949   BUN 21 05/26/2018 0520   BUN 23 06/10/2017 1028   BUN 29.2 (H) 05/26/2017 0949   CREATININE 0.79 05/26/2018 0520   CREATININE 1.05 (H) 04/24/2018 1545   CREATININE 1.4 (H) 05/26/2017 0949      Component Value Date/Time   CALCIUM 9.0 05/26/2018 0520   CALCIUM 8.9 05/26/2017 0949   ALKPHOS 106 05/22/2018 0243   ALKPHOS 157 (H) 05/26/2017 0949   AST 42 (H) 05/22/2018 0243   AST 35 04/24/2018 1545   AST 29 05/26/2017 0949   ALT 42 05/22/2018 0243   ALT 27 04/24/2018 1545   ALT 23 05/26/2017 0949   BILITOT 4.1 (H) 05/22/2018 0243   BILITOT 2.3 (H) 04/24/2018 1545   BILITOT 2.19 (H) 05/26/2017 4765     Current Outpatient Medications on File Prior to Visit  Medication Sig Dispense Refill  . amiodarone (PACERONE) 200 MG tablet Take 1 tablet (200 mg total) by mouth 2 (two) times daily. 60 tablet 3  . atorvastatin (LIPITOR) 40 MG tablet Take 1 tablet (40 mg total) by mouth daily. 90 tablet 0  . collagenase (SANTYL) ointment Apply topically daily. 15 g 0  . feeding supplement, GLUCERNA SHAKE, (GLUCERNA SHAKE) LIQD Take 237 mLs by mouth 3 (three) times daily between meals. 90 Can 0  . furosemide (LASIX) 40 MG tablet Take 1 tablet (40 mg total) by mouth 2 (two) times daily. 60 tablet 5  . hydrALAZINE (APRESOLINE) 25 MG tablet Take 3 tablets (75 mg total) by mouth every 8 (eight) hours. 90 tablet 5  . Hydrocortisone (GERHARDT'S BUTT CREAM) CREA Apply 1 application topically 2 (two) times daily. 1 each 5  . insulin glargine (LANTUS) 100 unit/mL SOPN Inject 0.14 mLs (14 Units total) into the skin daily after breakfast. (Patient taking differently: Inject 10 Units into the skin daily after breakfast. ) 15 mL 11  . Insulin Pen Needle 29G X 5MM MISC Use as directed 200 each 0  . JANUMET XR 50-500 MG TB24 Take 1 tablet by mouth daily. With evening meal  2  . latanoprost (XALATAN) 0.005 % ophthalmic solution Place 1 drop into both eyes at bedtime.      . lidocaine-prilocaine (EMLA) cream Apply a quarter size of cream to port 1-2 hours prior to access. Cover with saran wrap. 30 g 1  . metoprolol succinate (TOPROL-XL) 25 MG 24 hr tablet Take 1 tablet (25 mg total) by mouth 2 (two) times daily. 30 tablet 5  . Multiple Vitamins-Minerals (CENTRUM SILVER 50+WOMEN) TABS Take 1 tablet by mouth daily.    . pantoprazole (PROTONIX) 40 MG tablet Take 1 tablet (40 mg total) by mouth daily. 30 tablet 5  . potassium chloride SA (K-DUR,KLOR-CON) 20 MEQ tablet Take 1 tablet (20 mEq total) by mouth 2 (two) times daily. 60 tablet 5  . sildenafil (REVATIO) 20 MG tablet Take 1 tablet (20 mg total) by mouth 3 (three) times daily. 90 tablet  5  . spironolactone (ALDACTONE) 25 MG tablet Take 0.5 tablets (12.5 mg total) by mouth daily. 15 tablet 5   Current Facility-Administered Medications on File Prior to Visit  Medication Dose Route Frequency Provider Last Rate Last Dose  . sodium chloride flush (NS) 0.9 % injection 10 mL  10 mL Intravenous PRN [name], MD         Impression and Plan:  62 year old woman with:   1. 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  She is status post therapy outlined above and despite aggressive measures she continues to be transfusion dependent.  The natural course of her disease and risk of progression into acute leukemia was reiterated today.  And given her cardiac comorbidities, I have recommended supportive care only as the only modality.  We will continue to check her counts periodically and support her with transfusion as needed.  Have hospice involved in the near future.  We have discussed this option for her and she would like to defer it as long as possible.  Prefers to be home which she is likely to be discharged in the near future.  2.  Anemia: She received multiple transfusion while she is hospitalized with a hemoglobin of 9.2 on 05/26/2018.  Her hemoglobin today is 9.5 and she will not require any transfusion.   3.  Hypertension: Blood pressure mildly elevated but overall controlled.   4.  Aortic stenosis: She is not a candidate for any intervention at this time.  She continues to follow with cardiology regarding this issue.  5.  Poor appetite: But that is improving since her discharge.  6.  Prognosis: Poor overall due to her incurable hematological condition, transfusion dependence as well as cardiac consideration.  7. Follow-up: In 2 weeks to recheck her CBC.  25  minutes was spent with the patient face-to-face today.  More than 50% of time was dedicated to natural course of her disease, treatment options and discussing her overall prognosis.   Zola Button, MD 11/18/201910:28 AM

## 2018-06-02 ENCOUNTER — Ambulatory Visit (HOSPITAL_COMMUNITY)
Admission: RE | Admit: 2018-06-02 | Discharge: 2018-06-02 | Disposition: A | Discharge status: Home / Self Care | Payer: BC Managed Care – PPO | Source: Ambulatory Visit | Attending: Cardiovascular Disease | Admitting: Cardiovascular Disease

## 2018-06-02 ENCOUNTER — Inpatient Hospital Stay (HOSPITAL_COMMUNITY): Payer: BC Managed Care – PPO

## 2018-06-02 ENCOUNTER — Ambulatory Visit: Payer: BC Managed Care – PPO | Admitting: Cardiovascular Disease

## 2018-06-02 DIAGNOSIS — I6523 Occlusion and stenosis of bilateral carotid arteries: Secondary | ICD-10-CM | POA: Diagnosis not present

## 2018-06-03 NOTE — Progress Notes (Signed)
Disability completed and taken to [name] at front desk for patient to pick up.

## 2018-06-04 ENCOUNTER — Ambulatory Visit (HOSPITAL_COMMUNITY)
Admission: RE | Admit: 2018-06-04 | Discharge: 2018-06-04 | Disposition: A | Discharge status: Home / Self Care | Payer: BC Managed Care – PPO | Source: Ambulatory Visit | Attending: Internal Medicine | Admitting: Internal Medicine

## 2018-06-04 ENCOUNTER — Encounter (HOSPITAL_COMMUNITY): Payer: Self-pay

## 2018-06-04 ENCOUNTER — Other Ambulatory Visit: Payer: Self-pay | Admitting: *Deleted

## 2018-06-04 VITALS — BP 136/60 | HR 61 | Wt 187.6 lb

## 2018-06-04 DIAGNOSIS — Z79899 Other long term (current) drug therapy: Secondary | ICD-10-CM | POA: Diagnosis not present

## 2018-06-04 DIAGNOSIS — I480 Paroxysmal atrial fibrillation: Secondary | ICD-10-CM

## 2018-06-04 DIAGNOSIS — Z8249 Family history of ischemic heart disease and other diseases of the circulatory system: Secondary | ICD-10-CM | POA: Diagnosis not present

## 2018-06-04 DIAGNOSIS — I6523 Occlusion and stenosis of bilateral carotid arteries: Secondary | ICD-10-CM

## 2018-06-04 DIAGNOSIS — Z794 Long term (current) use of insulin: Secondary | ICD-10-CM | POA: Insufficient documentation

## 2018-06-04 DIAGNOSIS — I272 Pulmonary hypertension, unspecified: Secondary | ICD-10-CM | POA: Diagnosis not present

## 2018-06-04 DIAGNOSIS — I10 Essential (primary) hypertension: Secondary | ICD-10-CM | POA: Diagnosis not present

## 2018-06-04 DIAGNOSIS — E119 Type 2 diabetes mellitus without complications: Secondary | ICD-10-CM | POA: Diagnosis not present

## 2018-06-04 DIAGNOSIS — I2721 Secondary pulmonary arterial hypertension: Secondary | ICD-10-CM | POA: Diagnosis not present

## 2018-06-04 DIAGNOSIS — E875 Hyperkalemia: Secondary | ICD-10-CM

## 2018-06-04 DIAGNOSIS — N179 Acute kidney failure, unspecified: Secondary | ICD-10-CM

## 2018-06-04 DIAGNOSIS — Z87891 Personal history of nicotine dependence: Secondary | ICD-10-CM | POA: Diagnosis not present

## 2018-06-04 MED ORDER — HYDRALAZINE HCL 100 MG PO TABS: 100.0000 mg | ORAL_TABLET | Freq: Three times a day (TID) | ORAL | 6 refills | Status: DC

## 2018-06-04 NOTE — Progress Notes (Signed)
Advanced Heart Failure Clinic Note   Referring Physician: PCP: Glendale Chard, MD PCP-Cardiologist: Quay Burow, MD   HPI:  Patricia Mata is a 62 y.o. 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 Followed by Dr Gwenlyn Found for "severe" AS and pulmonary HTN.  Admitted on 10/27 with AF with RVR and recurrent anemia. Developed respiratory failure and intubated. She was transfused and placed on IV amio. Subsequently developed AKI/ESRD and placed on CVVHD. Converted to NSR on amio and weaned off the vent. Renal function now back to normal and off CVVHD. Transitioned to PO amiodarone after appropriate time. She was not put on [facility] due to chronic anemia requiring transfusions.  Echoshowed normal LVEF 65-70% with moderate AS and moderate MS and severe PH with RVSP 33mmHG. She is not a candidate for intervention of AS or MS with advanced PAH.  Underwent R/LHC that showed normal coronaries and severely elevated PA pressures. Full results below. AHF team was consulted for further management of Patricia Mata. V/Q scan was negative. She was started on sildenafil and tolerated well. She diuresed with IV lasix and transitioned to lasix 40 mg PO BID. She did not require O2 at discharge.   She required a total of 9 units of pRBCs while admitted. She requires transfusions every 2-3 weeks as an outpatient. She also developed a buttock wound. WOC was consulted and recommended hydrotherapy.   Palliative care was consulted and decision was made to transition to DNR. Yellow DNR form signed for DC. PT and OT recommended SNF at discharge. She will have palliative at SNF with possible transition to hospice eventually.  She presents today for post hospital follow up. Weight down 19 lbs since discharge. She is feeling great. Much more lively and alert than in hospital. Husband  present. Plans to remain in SNF into December. She denies SOB working with PT an hour daily. No orthopnea, lightheadedness or dizziness. No CP, palpitations, fever, or chills. Meds provided by SNF. Watching salt and fluid intake.  She states she has lost over 100 lbs in the past year with dieting and trying to be more active. She is very optimistic for healing.   Review of systems complete and found to be negative unless listed in HPI.    Past Medical History:  Diagnosis Date  . Cancer ([place])   . Diabetes mellitus without complication ([place])   . Hypertension   . Macrocytic anemia     Current Outpatient Medications  Medication Sig Dispense Refill  . amiodarone (PACERONE) 200 MG tablet Take 1 tablet (200 mg total) by mouth 2 (two) times daily. 60 tablet 3  . collagenase (SANTYL) ointment Apply topically daily. 15 g 0  . feeding supplement, GLUCERNA SHAKE, (GLUCERNA SHAKE) LIQD Take 237 mLs by mouth 3 (three) times daily between meals. 90 Can 0  . furosemide (LASIX) 40 MG tablet Take 1 tablet (40 mg total) by mouth 2 (two) times daily. 60 tablet 5  . hydrALAZINE (APRESOLINE) 25 MG tablet Take 3 tablets (75 mg total) by mouth every 8 (eight) hours. 90 tablet 5  . Hydrocortisone (GERHARDT'S BUTT CREAM) CREA Apply 1 application topically 2 (two) times daily. 1 each 5  . insulin glargine (LANTUS) 100 unit/mL SOPN Inject 0.14 mLs (14 Units total) into the skin daily after breakfast. (Patient taking differently: Inject 10 Units into the skin daily after breakfast. ) 15 mL 11  . Insulin Pen  Needle 29G X 5MM MISC Use as directed 200 each 0  . JANUMET XR 50-500 MG TB24 Take 1 tablet by mouth daily. With evening meal  2  . latanoprost (XALATAN) 0.005 % ophthalmic solution Place 1 drop into both eyes at bedtime.     . lidocaine-prilocaine (EMLA) cream Apply a quarter size of cream to port 1-2 hours prior to access. Cover with saran wrap. 30 g 1  . metoprolol succinate (TOPROL-XL) 25 MG 24 hr tablet Take 1  tablet (25 mg total) by mouth 2 (two) times daily. 30 tablet 5  . Multiple Vitamins-Minerals (CENTRUM SILVER 50+WOMEN) TABS Take 1 tablet by mouth daily.    . pantoprazole (PROTONIX) 40 MG tablet Take 1 tablet (40 mg total) by mouth daily. 30 tablet 5  . potassium chloride SA (K-DUR,KLOR-CON) 20 MEQ tablet Take 1 tablet (20 mEq total) by mouth 2 (two) times daily. 60 tablet 5  . sildenafil (REVATIO) 20 MG tablet Take 1 tablet (20 mg total) by mouth 3 (three) times daily. 90 tablet 5  . spironolactone (ALDACTONE) 25 MG tablet Take 0.5 tablets (12.5 mg total) by mouth daily. 15 tablet 5  . atorvastatin (LIPITOR) 40 MG tablet Take 1 tablet (40 mg total) by mouth daily. 90 tablet 0   No current facility-administered medications for this encounter.    Facility-Administered Medications Ordered in Other Encounters  Medication Dose Route Frequency Provider Last Rate Last Dose  . sodium chloride flush (NS) 0.9 % injection 10 mL  10 mL Intravenous PRN [name], MD        Allergies  Allergen Reactions  . Ancef [Cefazolin] Itching    Severe itching- after procedure, ancef was the antibiotic.-04/02/17  . Sulfa Antibiotics Rash and Other (See Comments)    Blisters, also      Social History   Socioeconomic History  . Marital status: Married    Spouse name: Not on file  . Number of children: Not on file  . Years of education: Not on file  . Highest education level: Not on file  Occupational History  . Not on file  Social Needs  . Financial resource strain: Not on file  . Food insecurity:    Worry: Not on file    Inability: Not on file  . Transportation needs:    Medical: Not on file    Non-medical: Not on file  Tobacco Use  . Smoking status: Former Smoker    Types: Cigarettes    Last attempt to quit: 01/31/2001    Years since quitting: 17.3  . Smokeless tobacco: Former Network engineer and Sexual Activity  . Alcohol use: Yes    Alcohol/week: 2.0 standard drinks    Types: 2  Shots of liquor per week  . Drug use: Never  . Sexual activity: Not on file  Lifestyle  . Physical activity:    Days per week: Not on file    Minutes per session: Not on file  . Stress: Not on file  Relationships  . Social connections:    Talks on phone: Not on file    Gets together: Not on file    Attends religious service: Not on file    Active member of club or organization: Not on file    Attends meetings of clubs or organizations: Not on file    Relationship status: Not on file  . Intimate partner violence:    Fear of current or ex partner: Not on file    Emotionally  abused: Not on file    Physically abused: Not on file    Forced sexual activity: Not on file  Other Topics Concern  . Not on file  Social History Narrative  . Not on file      Family History  Problem Relation Age of Onset  . Hypertension Mother   . Heart attack Father   . Hypertension Sister   . Hypertension Brother   . Hypertension Sister   . Hypertension Sister   . Prostate cancer Brother   . HIV/AIDS Brother     Vitals:   06/04/18 1433  BP: 136/60  Pulse: 61  SpO2: 97%  Weight: 85.1 kg (187 lb 9.6 oz)   Wt Readings from Last 3 Encounters:  06/04/18 85.1 kg (187 lb 9.6 oz)  05/26/18 93.8 kg (206 lb 14.4 oz)  04/16/18 97.4 kg (214 lb 11.2 oz)    PHYSICAL EXAM: General:  Well appearing. No respiratory difficulty HEENT: normal Neck: supple. JVP ~ 7-8 cm. Carotids 2+ bilat; no bruits. No lymphadenopathy or thyromegaly appreciated. Cor: PMI nondisplaced. Regular rate & rhythm. No rubs, gallops or murmurs. Lungs: Diminished throughout.  Abdomen: soft, nontender, nondistended. No hepatosplenomegaly. No bruits or masses. Good bowel sounds. Extremities: no cyanosis, clubbing, rash, or edema. Neuro: alert & oriented x 3, cranial nerves grossly intact. moves all 4 extremities w/o difficulty. Affect pleasant.  ASSESSMENT & PLAN:  1. PAH - Echo 05/12/18 LVEF 65-70%, Grade 2 DD, Mod AS, Mild/Mod  MS, Mod TR, Trivial PI, PA peak pressure 93.  - L/RHC 05/21/18 with no significant CAD, severe pulmonary HTN with PAP 62 mmHg, PVR 5.4 WU.  - Severe, likely longstanding with NYHAI IIIB-IV - Suspect multifactorial in nature suspect mostly WHO group II (diastolic HF and valvular disease) and III (OSA) but given degree of PH cannot exclude WHO Group I component. PVR 5.4 WU - She is likely end stage. Will focus on volume and symptom management.  - VQ negative - PFTs and assess need for home O2 - Continue sildenafil 20 mg TID cautiously. Tolerating ok - Volume status OK on exam.   - Continue lasix 40 mg BID.  - She is doing very well, even better than expected at this point. Continue meds as above.   2. Moderate AS/MS - She is not candidate for intervention with advanced PAH. No change.   3. PAF - Remains in NSR on amio. Did not tolerate AF well at all. - Continue amiodarone 200 mg BID for now.  - Continue Toprol XL 25 mg BID as tolerated.  - Would try and avoid anticoagulation with transfusion dependent anemia. (chronic).  - CHA2DS2/VASc is at least 4.   4. AKI - Cr 1.36 today. Following closely with labs Monday as below. No lightheadedness or dizziness. May need to cut back lasix.   5. 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 Total of 7 uPRBCs given since 05/11/18. - Hgb 9.6 earlier this week. Per primary.   6. Buttock wound - Wound care following at facility.   7. HTN - Increase hydral to 100 mg TID.   8. Hyperkalemia - Received blood work from [facility] after patient left.  K 5.5 this am.  - Stop K supp. Repeat BMET Monday  She is doing very well. Meds and labs as above. RTC 3 weeks. Sooner with symptoms.   Shirley Friar, PA-C 06/04/18   Greater than 50% of the 25 minute visit was spent in counseling/coordination of  care regarding disease state education, salt/fluid restriction, sliding scale diuretics, and  medication compliance.

## 2018-06-04 NOTE — Progress Notes (Signed)
vas 

## 2018-06-04 NOTE — Patient Instructions (Signed)
Increase Hydralazine 100mg  three times a day  Your physician recommends that you schedule a follow-up appointment in: 3 weeks

## 2018-06-08 ENCOUNTER — Inpatient Hospital Stay (HOSPITAL_COMMUNITY)
Admission: EM | Admit: 2018-06-08 | Discharge: 2018-06-15 | DRG: 628 | Disposition: A | Discharge status: Skilled Nursing Facility | Payer: BC Managed Care – PPO | Attending: Internal Medicine | Admitting: Internal Medicine

## 2018-06-08 ENCOUNTER — Other Ambulatory Visit: Payer: Self-pay

## 2018-06-08 ENCOUNTER — Encounter (HOSPITAL_COMMUNITY): Payer: Self-pay | Admitting: Emergency Medicine

## 2018-06-08 ENCOUNTER — Emergency Department (HOSPITAL_COMMUNITY): Payer: BC Managed Care – PPO

## 2018-06-08 DIAGNOSIS — Z66 Do not resuscitate: Secondary | ICD-10-CM | POA: Diagnosis present

## 2018-06-08 DIAGNOSIS — L89159 Pressure ulcer of sacral region, unspecified stage: Secondary | ICD-10-CM | POA: Diagnosis not present

## 2018-06-08 DIAGNOSIS — I132 Hypertensive heart and chronic kidney disease with heart failure and with stage 5 chronic kidney disease, or end stage renal disease: Secondary | ICD-10-CM | POA: Diagnosis present

## 2018-06-08 DIAGNOSIS — N179 Acute kidney failure, unspecified: Secondary | ICD-10-CM

## 2018-06-08 DIAGNOSIS — I10 Essential (primary) hypertension: Secondary | ICD-10-CM | POA: Diagnosis not present

## 2018-06-08 DIAGNOSIS — M86159 Other acute osteomyelitis, unspecified femur: Secondary | ICD-10-CM

## 2018-06-08 DIAGNOSIS — Z85038 Personal history of other malignant neoplasm of large intestine: Secondary | ICD-10-CM | POA: Diagnosis not present

## 2018-06-08 DIAGNOSIS — I4819 Other persistent atrial fibrillation: Secondary | ICD-10-CM | POA: Diagnosis present

## 2018-06-08 DIAGNOSIS — E11649 Type 2 diabetes mellitus with hypoglycemia without coma: Secondary | ICD-10-CM | POA: Diagnosis present

## 2018-06-08 DIAGNOSIS — Z87891 Personal history of nicotine dependence: Secondary | ICD-10-CM | POA: Diagnosis not present

## 2018-06-08 DIAGNOSIS — I272 Pulmonary hypertension, unspecified: Secondary | ICD-10-CM | POA: Diagnosis present

## 2018-06-08 DIAGNOSIS — N181 Chronic kidney disease, stage 1: Secondary | ICD-10-CM | POA: Diagnosis not present

## 2018-06-08 DIAGNOSIS — I350 Nonrheumatic aortic (valve) stenosis: Secondary | ICD-10-CM | POA: Diagnosis not present

## 2018-06-08 DIAGNOSIS — B964 Proteus (mirabilis) (morganii) as the cause of diseases classified elsewhere: Secondary | ICD-10-CM | POA: Diagnosis present

## 2018-06-08 DIAGNOSIS — E785 Hyperlipidemia, unspecified: Secondary | ICD-10-CM | POA: Diagnosis present

## 2018-06-08 DIAGNOSIS — D649 Anemia, unspecified: Secondary | ICD-10-CM | POA: Diagnosis not present

## 2018-06-08 DIAGNOSIS — I5032 Chronic diastolic (congestive) heart failure: Secondary | ICD-10-CM | POA: Diagnosis present

## 2018-06-08 DIAGNOSIS — E871 Hypo-osmolality and hyponatremia: Secondary | ICD-10-CM | POA: Diagnosis present

## 2018-06-08 DIAGNOSIS — E1122 Type 2 diabetes mellitus with diabetic chronic kidney disease: Secondary | ICD-10-CM | POA: Diagnosis present

## 2018-06-08 DIAGNOSIS — L89154 Pressure ulcer of sacral region, stage 4: Secondary | ICD-10-CM

## 2018-06-08 DIAGNOSIS — Z79899 Other long term (current) drug therapy: Secondary | ICD-10-CM

## 2018-06-08 DIAGNOSIS — I080 Rheumatic disorders of both mitral and aortic valves: Secondary | ICD-10-CM | POA: Diagnosis present

## 2018-06-08 DIAGNOSIS — E860 Dehydration: Secondary | ICD-10-CM | POA: Diagnosis present

## 2018-06-08 DIAGNOSIS — E1169 Type 2 diabetes mellitus with other specified complication: Principal | ICD-10-CM | POA: Diagnosis present

## 2018-06-08 DIAGNOSIS — D631 Anemia in chronic kidney disease: Secondary | ICD-10-CM | POA: Diagnosis present

## 2018-06-08 DIAGNOSIS — Z7289 Other problems related to lifestyle: Secondary | ICD-10-CM | POA: Diagnosis not present

## 2018-06-08 DIAGNOSIS — D469 Myelodysplastic syndrome, unspecified: Secondary | ICD-10-CM | POA: Diagnosis present

## 2018-06-08 DIAGNOSIS — E7800 Pure hypercholesterolemia, unspecified: Secondary | ICD-10-CM | POA: Diagnosis not present

## 2018-06-08 DIAGNOSIS — D509 Iron deficiency anemia, unspecified: Secondary | ICD-10-CM | POA: Diagnosis not present

## 2018-06-08 DIAGNOSIS — M4628 Osteomyelitis of vertebra, sacral and sacrococcygeal region: Secondary | ICD-10-CM | POA: Diagnosis present

## 2018-06-08 DIAGNOSIS — Z794 Long term (current) use of insulin: Secondary | ICD-10-CM | POA: Diagnosis not present

## 2018-06-08 DIAGNOSIS — S31000D Unspecified open wound of lower back and pelvis without penetration into retroperitoneum, subsequent encounter: Secondary | ICD-10-CM | POA: Diagnosis not present

## 2018-06-08 DIAGNOSIS — M545 Low back pain: Secondary | ICD-10-CM | POA: Diagnosis not present

## 2018-06-08 DIAGNOSIS — M869 Osteomyelitis, unspecified: Secondary | ICD-10-CM | POA: Diagnosis not present

## 2018-06-08 DIAGNOSIS — E039 Hypothyroidism, unspecified: Secondary | ICD-10-CM | POA: Diagnosis present

## 2018-06-08 DIAGNOSIS — R002 Palpitations: Secondary | ICD-10-CM | POA: Diagnosis not present

## 2018-06-08 DIAGNOSIS — Z9049 Acquired absence of other specified parts of digestive tract: Secondary | ICD-10-CM | POA: Diagnosis not present

## 2018-06-08 DIAGNOSIS — S31000A Unspecified open wound of lower back and pelvis without penetration into retroperitoneum, initial encounter: Secondary | ICD-10-CM | POA: Diagnosis present

## 2018-06-08 DIAGNOSIS — D500 Iron deficiency anemia secondary to blood loss (chronic): Secondary | ICD-10-CM | POA: Diagnosis present

## 2018-06-08 DIAGNOSIS — Z881 Allergy status to other antibiotic agents status: Secondary | ICD-10-CM | POA: Diagnosis not present

## 2018-06-08 DIAGNOSIS — A498 Other bacterial infections of unspecified site: Secondary | ICD-10-CM

## 2018-06-08 DIAGNOSIS — I739 Peripheral vascular disease, unspecified: Secondary | ICD-10-CM

## 2018-06-08 DIAGNOSIS — K5900 Constipation, unspecified: Secondary | ICD-10-CM | POA: Diagnosis present

## 2018-06-08 DIAGNOSIS — I779 Disorder of arteries and arterioles, unspecified: Secondary | ICD-10-CM | POA: Diagnosis present

## 2018-06-08 DIAGNOSIS — Z9889 Other specified postprocedural states: Secondary | ICD-10-CM | POA: Diagnosis not present

## 2018-06-08 HISTORY — DX: Acute kidney failure, unspecified: N17.9

## 2018-06-08 LAB — CBC WITH DIFFERENTIAL/PLATELET
Abs Immature Granulocytes: 0.07 10*3/uL (ref 0.00–0.07)
Basophils Absolute: 0 10*3/uL (ref 0.0–0.1)
Basophils Relative: 0 %
Eosinophils Absolute: 0.1 10*3/uL (ref 0.0–0.5)
Eosinophils Relative: 0 %
HCT: 21.7 % — ABNORMAL LOW (ref 36.0–46.0)
Hemoglobin: 6.9 g/dL — CL (ref 12.0–15.0)
Immature Granulocytes: 1 %
Lymphocytes Relative: 8 %
Lymphs Abs: 0.9 10*3/uL (ref 0.7–4.0)
MCH: 30.1 pg (ref 26.0–34.0)
MCHC: 31.8 g/dL (ref 30.0–36.0)
MCV: 94.8 fL (ref 80.0–100.0)
Monocytes Absolute: 1.7 10*3/uL — ABNORMAL HIGH (ref 0.1–1.0)
Monocytes Relative: 14 %
Neutro Abs: 9.3 10*3/uL — ABNORMAL HIGH (ref 1.7–7.7)
Neutrophils Relative %: 77 %
Platelets: 339 10*3/uL (ref 150–400)
RBC: 2.29 MIL/uL — ABNORMAL LOW (ref 3.87–5.11)
RDW: 17.7 % — ABNORMAL HIGH (ref 11.5–15.5)
WBC: 12 10*3/uL — ABNORMAL HIGH (ref 4.0–10.5)
nRBC: 0 % (ref 0.0–0.2)

## 2018-06-08 LAB — BASIC METABOLIC PANEL
Anion gap: 10 (ref 5–15)
BUN: 60 mg/dL — ABNORMAL HIGH (ref 8–23)
CO2: 21 mmol/L — ABNORMAL LOW (ref 22–32)
Calcium: 9.1 mg/dL (ref 8.9–10.3)
Chloride: 101 mmol/L (ref 98–111)
Creatinine, Ser: 2.15 mg/dL — ABNORMAL HIGH (ref 0.44–1.00)
GFR calc Af Amer: 27 mL/min — ABNORMAL LOW (ref >60)
GFR calc non Af Amer: 23 mL/min — ABNORMAL LOW (ref >60)
Glucose, Bld: 151 mg/dL — ABNORMAL HIGH (ref 70–99)
Potassium: 4.7 mmol/L (ref 3.5–5.1)
Sodium: 132 mmol/L — ABNORMAL LOW (ref 135–145)

## 2018-06-08 LAB — RETICULOCYTES
Immature Retic Fract: 6.7 % (ref 2.3–15.9)
RBC.: 2.23 MIL/uL — ABNORMAL LOW (ref 3.87–5.11)
Retic Count, Absolute: 31.7 10*3/uL (ref 19.0–186.0)
Retic Ct Pct: 1.4 % (ref 0.4–3.1)

## 2018-06-08 LAB — URINALYSIS, ROUTINE W REFLEX MICROSCOPIC
Bilirubin Urine: NEGATIVE
Glucose, UA: NEGATIVE mg/dL
Hgb urine dipstick: NEGATIVE
Ketones, ur: NEGATIVE mg/dL
Nitrite: NEGATIVE
Protein, ur: NEGATIVE mg/dL
Specific Gravity, Urine: 1.009 (ref 1.005–1.030)
pH: 5 (ref 5.0–8.0)

## 2018-06-08 LAB — PREPARE RBC (CROSSMATCH)

## 2018-06-08 LAB — POC OCCULT BLOOD, ED: Fecal Occult Bld: NEGATIVE

## 2018-06-08 MED ORDER — VANCOMYCIN HCL 10 G IV SOLR
1500.0000 mg | Freq: Once | INTRAVENOUS | Status: AC
  Administered 2018-06-08: 1500 mg via INTRAVENOUS
  Filled 2018-06-08: qty 1500

## 2018-06-08 MED ORDER — SODIUM CHLORIDE 0.9 % IV SOLN
10.0000 mL/h | Freq: Once | INTRAVENOUS | Status: AC
  Administered 2018-06-08: 10 mL/h via INTRAVENOUS

## 2018-06-08 MED ORDER — CLINDAMYCIN PHOSPHATE 600 MG/50ML IV SOLN: 600.0000 mg | Freq: Once | INTRAVENOUS | Status: DC

## 2018-06-08 MED ORDER — MORPHINE SULFATE (PF) 4 MG/ML IV SOLN
4.0000 mg | Freq: Once | INTRAVENOUS | Status: AC
  Administered 2018-06-08: 4 mg via INTRAVENOUS
  Filled 2018-06-08: qty 1

## 2018-06-08 MED ORDER — SODIUM CHLORIDE 0.9 % IV BOLUS
500.0000 mL | Freq: Once | INTRAVENOUS | Status: AC
  Administered 2018-06-08: 500 mL via INTRAVENOUS

## 2018-06-08 NOTE — ED Notes (Signed)
Date and time results received: 06/08/18 1659 (use smartphrase ".now" to insert current time)  Test: HGB Critical Value: 6.9  Name of Provider Notified: Dr. Malvin Johns  Orders Received? Or Actions Taken?: Actions Taken: report to [employer]

## 2018-06-08 NOTE — ED Provider Notes (Signed)
[facility] DEPT Provider Note   CSN: [id number] Arrival date & time: 06/08/18  1344     History   Chief Complaint Chief Complaint  Patient presents with  . Wound Check    HPI Patricia Mata is a 62 y.o. female.  HPI   Patient is a 62 year old female with a history of colon cancer (in remission, no active tx), diabetes, hypertension, anemia, who presents the emergency department today via EMS from [facility] for a wound check.  Patient has sacral wound that developed during her last admission in the ICU from 05/11/18-05/26/18. She has been receiving wound care at her rehab facility but sxs have worsened. She is concerned because she has had bleeding from the area. She has had the wound cauterized but continues to have bleeding. No purulent drainage noted. Reports chills, but no sweats and no documented fevers at her facility. She has been feeling generally weak for the last few days. Family at bedside assists with history and states they are concerned because the patient is bleeding and has a h/o anemia. They state that she typically gets blood transfusions every 2 weeks at the [facility]. Pt states she thinks she is on blood thinners, but is not sure which one.  Staff at [facility] reported that patient has been bleeding and starting to have clots from the area.  MS noted blood pressure 122/50, heart rate 69, satting well on room air.  Past Medical History:  Diagnosis Date  . Cancer [facility]) 2008   Colon   . Diabetes mellitus without complication ([place])   . Hypertension   . Macrocytic anemia     Patient Active Problem List   Diagnosis Date Noted  . Sacral wound 06/08/2018  . Iron deficiency anemia due to chronic blood loss 06/08/2018  . Acute renal failure (ARF) ([place]) 06/08/2018  . Hyponatremia 06/08/2018  . Constipation 06/08/2018  . Palliative care encounter   . Pressure injury of skin 05/22/2018  . Acute kidney injury  (nontraumatic) ([place])   . Lactic acidosis   . Acute hypoxemic respiratory failure ([place])   . Cardiogenic shock ([place])   . Atrial fibrillation with rapid ventricular response ([place]) 05/11/2018  . Symptomatic anemia 06/14/2017  . Severe aortic stenosis 06/10/2017  . Carotid artery disease ([place]) 06/10/2017  . Essential hypertension 05/13/2017  . Hyperlipidemia 05/13/2017  . Bilateral lower extremity edema 05/13/2017  . Port-A-Cath in place 04/28/2017  . MDS (myelodysplastic syndrome) ([place]) 03/20/2017  . Goals of care, counseling/discussion 03/20/2017  . Anemia in chronic kidney disease 05/10/2016  . Anemia in stage 1 chronic kidney disease 05/10/2016  . Anemia 02/09/2016  . 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 COLON SURGERY    . IR FLUORO GUIDE PORT INSERTION RIGHT  04/02/2017  . IR US GUIDE VASC ACCESS RIGHT  04/02/2017  . RIGHT/LEFT HEART CATH AND CORONARY ANGIOGRAPHY N/A 05/21/2018   Procedure: RIGHT/LEFT HEART CATH AND CORONARY ANGIOGRAPHY;  Surgeon: Nelva Bush, MD;  Location: [facility];  Service: Cardiovascular;  Laterality: N/A;     OB History   None      Home Medications    Prior to Admission medications   Medication Sig Start Date End Date Taking? Authorizing Provider  amiodarone (PACERONE) 200 MG tablet Take 1 tablet (200 mg total) by mouth 2 (two) times daily. 05/26/18  Yes Georgiana Shore, NP  atorvastatin (  LIPITOR) 40 MG tablet Take 1 tablet (40 mg total) by mouth daily. 04/23/18  Yes [name], FNP  collagenase (SANTYL) ointment Apply topically daily. 05/27/18  Yes Georgiana Shore, NP  furosemide (LASIX) 40 MG tablet Take 1 tablet (40 mg total) by mouth 2 (two) times daily. 05/26/18  Yes Georgiana Shore, NP  hydrALAZINE (APRESOLINE) 100 MG tablet Take 1 tablet (100 mg total) by mouth 3 (three) times daily. 06/04/18  Yes Shirley Friar, PA-C    insulin glargine (LANTUS) 100 unit/mL SOPN Inject 0.14 mLs (14 Units total) into the skin daily after breakfast. 06/16/17  Yes Bonnielee Haff, MD  JANUMET XR 50-500 MG TB24 Take 1 tablet by mouth daily. With evening meal 02/07/18  Yes [provider]  latanoprost (XALATAN) 0.005 % ophthalmic solution Place 1 drop into both eyes at bedtime.    Yes [provider]  metoprolol succinate (TOPROL-XL) 25 MG 24 hr tablet Take 1 tablet (25 mg total) by mouth 2 (two) times daily. 05/26/18  Yes Georgiana Shore, NP  metroNIDAZOLE (FLAGYL) 250 MG tablet Take 250 mg by mouth daily.   Yes [provider]  Multiple Vitamins-Minerals (CENTRUM SILVER 50+WOMEN) TABS Take 1 tablet by mouth daily.   Yes [provider]  mupirocin ointment (BACTROBAN) 2 % Place 1 application into the nose daily.   Yes [provider]  oxycodone-acetaminophen (PERCOCET) 2.5-325 MG tablet Take 1 tablet by mouth daily as needed for pain.   Yes [provider]  pantoprazole (PROTONIX) 40 MG tablet Take 1 tablet (40 mg total) by mouth daily. 05/27/18  Yes Georgiana Shore, NP  sildenafil (REVATIO) 20 MG tablet Take 1 tablet (20 mg total) by mouth 3 (three) times daily. 05/26/18  Yes Georgiana Shore, NP  sodium hypochlorite (DAKIN'S 1/2 STRENGTH) external solution Irrigate with 1 application as directed daily.   Yes [provider]  spironolactone (ALDACTONE) 25 MG tablet Take 0.5 tablets (12.5 mg total) by mouth daily. 05/27/18  Yes Georgiana Shore, NP  traMADol (ULTRAM) 50 MG tablet Take 50 mg by mouth daily.   Yes [provider]  feeding supplement, GLUCERNA SHAKE, (GLUCERNA SHAKE) LIQD Take 237 mLs by mouth 3 (three) times daily between meals. Patient not taking: Reported on 06/08/2018 05/26/18   Georgiana Shore, NP  Hydrocortisone (GERHARDT'S BUTT CREAM) CREA Apply 1 application topically 2 (two) times daily. Patient not taking: Reported on 06/08/2018 05/26/18    Georgiana Shore, NP  Insulin Pen Needle 29G X 5MM MISC Use as directed 06/16/17   Bonnielee Haff, MD  lidocaine-prilocaine (EMLA) cream Apply a quarter size of cream to port 1-2 hours prior to access. Cover with saran wrap. Patient not taking: Reported on 06/08/2018 04/17/17   [name], MD  potassium chloride SA (K-DUR,KLOR-CON) 20 MEQ tablet Take 1 tablet (20 mEq total) by mouth 2 (two) times daily. Patient not taking: Reported on 06/08/2018 05/26/18   Georgiana Shore, NP    Family History Family History  Problem Relation Age of Onset  . Hypertension Mother   . Heart attack Father   . Hypertension Sister   . Hypertension Brother   . Hypertension Sister   . Hypertension Sister   . Prostate cancer Brother   . HIV/AIDS Brother     Social History Social History   Tobacco Use  . Smoking status: Former Smoker    Types: Cigarettes    Last attempt to quit: 01/31/2001    Years  since quitting: 17.3  . Smokeless tobacco: Former Network engineer Use Topics  . Alcohol use: Yes    Alcohol/week: 2.0 standard drinks    Types: 2 Shots of liquor per week  . Drug use: Never     Allergies   Ancef [cefazolin] and Sulfa antibiotics   Review of Systems Review of Systems  Constitutional: Positive for chills. Negative for diaphoresis and fever.  HENT: Negative for ear pain and sore throat.   Eyes: Negative for visual disturbance.  Respiratory: Negative for cough and shortness of breath.   Cardiovascular: Negative for chest pain.  Gastrointestinal: Negative for abdominal pain, constipation, diarrhea, nausea and vomiting.  Genitourinary: Negative for dysuria, frequency, hematuria and urgency.  Musculoskeletal: Negative for back pain.  Skin: Positive for wound. Negative for rash.  Neurological: Positive for weakness. Negative for headaches.  All other systems reviewed and are negative.   Physical Exam Updated Vital Signs BP (!) 154/45   Pulse (!) 58   Temp 98 F (36.7 C) (Oral)    Resp 11   Ht 5\' 4"  (1.626 m)   Wt 84.8 kg   SpO2 100%   BMI 32.10 kg/m   Physical Exam  Constitutional: She appears well-developed and well-nourished. No distress.  HENT:  Head: Normocephalic and atraumatic.  Eyes: Conjunctivae are normal.  Neck: Neck supple.  Cardiovascular: Normal rate, regular rhythm and normal heart sounds.  No murmur heard. Pulmonary/Chest: Effort normal and breath sounds normal. No stridor. No respiratory distress. She has no wheezes.  Abdominal: Soft. Bowel sounds are normal. There is no tenderness.  Neurological: She is alert.  Skin: Skin is warm and dry.  Sacral wound present. Wound was probed with qtip and there is no evidence of tracking. No purulent drainage or foul odor. Blood clots noted to the wound. No surrounding erythema and no crepitus noted. (wound pictured below)  Psychiatric: She has a normal mood and affect.  Nursing note and vitals reviewed.        ED Treatments / Results  Labs (all labs ordered are listed, but only abnormal results are displayed) Labs Reviewed  CBC WITH DIFFERENTIAL/PLATELET - Abnormal; Notable for the following components:      Result Value   WBC 12.0 (*)    RBC 2.29 (*)    Hemoglobin 6.9 (*)    HCT 21.7 (*)    RDW 17.7 (*)    Neutro Abs 9.3 (*)    Monocytes Absolute 1.7 (*)    All other components within normal limits  BASIC METABOLIC PANEL - Abnormal; Notable for the following components:   Sodium 132 (*)    CO2 21 (*)    Glucose, Bld 151 (*)    BUN 60 (*)    Creatinine, Ser 2.15 (*)    GFR calc non Af Amer 23 (*)    GFR calc Af Amer 27 (*)    All other components within normal limits  URINALYSIS, ROUTINE W REFLEX MICROSCOPIC - Abnormal; Notable for the following components:   APPearance HAZY (*)    Leukocytes, UA TRACE (*)    Bacteria, UA RARE (*)    All other components within normal limits  RETICULOCYTES - Abnormal; Notable for the following components:   RBC. 2.23 (*)    All other  components within normal limits  FOLATE  OCCULT BLOOD X 1 CARD TO LAB, STOOL  VITAMIN B12  IRON AND TIBC  FERRITIN  POC OCCULT BLOOD, ED  TYPE AND SCREEN  PREPARE RBC (CROSSMATCH)  EKG EKG Interpretation  Date/Time:  Monday June 08 2018 15:47:50 EST Ventricular Rate:  59 PR Interval:    QRS Duration: 99 QT Interval:  492 QTC Calculation: 488 R Axis:   52 Text Interpretation:  Undetermined rhythm Abnormal R-wave progression, early transition Borderline prolonged QT interval Confirmed by Malvin Johns [phone]) on 06/08/2018 5:29:04 PM   Radiology Ct Pelvis Wo Contrast  Result Date: 06/08/2018 CLINICAL DATA:  Initial evaluation for sacral decubitus ulcer. EXAM: CT PELVIS WITHOUT CONTRAST TECHNIQUE: Multidetector CT imaging of the pelvis was performed following the standard protocol without intravenous contrast. COMPARISON:  Prior CT from 05/08/2017. FINDINGS: Urinary Tract: Punctate nonobstructive calculi noted within the partially visualized lower pole the right kidney. Visualized kidneys otherwise unremarkable. Visualized ureters of normal caliber without abnormality. Bladder moderately distended without acute abnormality. Bowel: Anastomotic suture present at the right colon. Visualized bowels of normal caliber without evidence for obstruction. Moderate to large volume retained stool within the distal colon and rectal vault, suggesting constipation. No acute inflammatory changes about the visualized bowels. Mild colonic diverticulosis without evidence for acute diverticulitis. Vascular/Lymphatic: Extensive aorto bi-iliac atherosclerotic disease with extensive vascular calcifications throughout the pelvic vasculature. No adenopathy. Reproductive: Multiple calcifications within the uterus likely reflect small calcified fibroids. Uterus and ovaries otherwise within normal limits. Other: No free air or fluid. Small irregular fat containing paraumbilical hernia with associated suture.  Musculoskeletal: Mild diffuse anasarca. Visualized musculature of the pelvis and upper thighs atrophic in appearance. Sacral decubitus ulcer seen at the lower midline (series 10, image 119). Ulceration extends to the level of the underlying coccyx. No osseous erosion or periosteal reaction to suggest osteomyelitis within the underlying coccyx. No loculated collections identified on this noncontrast examination. No abnormal soft tissue emphysema. No acute osseous abnormality. No discrete lytic or blastic osseous lesions. Degenerative changes noted within the visualized lower lumbar spine. IMPRESSION: 1. Sacral decubitus ulcer at the mid posterior pelvis, extending to the level of the underlying coccyx. No evidence for associated osteomyelitis by CT. No discrete or drainable collections identified. 2. Moderate to large volume stool within the visualized colon, consistent with constipation. 3. Nonobstructive right renal nephrolithiasis. 4. Extensive atherosclerosis. Electronically Signed   By: Jeannine Boga M.D.   On: 06/08/2018 18:17    Procedures Procedures (including critical care time) CRITICAL CARE Performed by: Rodney Booze   Total critical care time: 38 minutes  Critical care time was exclusive of separately billable procedures and treating other patients.  Critical care was necessary to treat or prevent imminent or life-threatening deterioration.  Critical care was time spent personally by me on the following activities: development of treatment plan with patient and/or surrogate as well as nursing, discussions with consultants, evaluation of patient's response to treatment, examination of patient, obtaining history from patient or surrogate, ordering and performing treatments and interventions, ordering and review of laboratory studies, ordering and review of radiographic studies, pulse oximetry and re-evaluation of patient's condition.   Medications Ordered in ED Medications    morphine 4 MG/ML injection 4 mg (4 mg Intravenous Given 06/08/18 1713)  sodium chloride 0.9 % bolus 500 mL (0 mLs Intravenous Stopped 06/08/18 1827)  0.9 %  sodium chloride infusion (0 mL/hr Intravenous Stopped 06/08/18 2041)  vancomycin (VANCOCIN) 1,500 mg in sodium chloride 0.9 % 500 mL IVPB (0 mg Intravenous Stopped 06/08/18 2030)     Initial Impression / Assessment and Plan / ED Course  I have reviewed the triage vital signs and the nursing notes.  Pertinent labs &  imaging results that were available during my care of the patient were reviewed by me and considered in my medical decision making (see chart for details).    Reviewed patient's medications in the chart and there is no venous that she is anticoagulated.  Final Clinical Impressions(s) / ED Diagnoses   Final diagnoses:  Symptomatic anemia  AKI (acute kidney injury) ([place])  Wound of sacral region, initial encounter    Patient presenting for evaluation of sacral wound that has been bleeding and generalized weakness.  It radiates down she is afebrile in the ED, with normal vital signs.  She is nontoxic appearing.  CBC with leukocytosis at 12 and anemia at 6.9. Unclear if leukocytosis is secondary to underlying infection of her wound however will start her on abx to cover potential infection. She denies steroid use or other infectious sxs. Two units PRBC given for her anemia.  BMP with hyponatremia at 132, elevated BUN/Cr at 60/2.15.  CT ordered to further characterize the patient's wound.  Patient unable to receive contrast due to her kidney function.  Will order without contrast.  CT shows sacral decubitus ulcer at the mid posterior pelvis, extending to the level of the underlying coccyx. No evidence for associated osteomyelitis by CT. No discrete or drainable collections identified.   Will plan for admission for symptomatic anemia, AKI in setting of CKD, and possible infection of her sacral decubitus ulcer.   Consult with  Dr. Lily Kocher wit hospitalist service who accepts pt for admission. Request that I call general surgery in consultation and to collect and FOBT.   Consult with Dr. Gershon Crane, with general surgery who will see the patient in consultation in the morning.   ED Discharge Orders    None       Rodney Booze, [place] 06/09/18 [id number]    Malvin Johns, MD 06/11/18 [phone]

## 2018-06-08 NOTE — ED Notes (Signed)
Pt aware that a sample is needed.  Purwik placed on pt..  Awaiting sample.

## 2018-06-08 NOTE — ED Notes (Signed)
Bed: WA04 Expected date:  Expected time:  Means of arrival:  Comments: EMS sacral wound bleeding

## 2018-06-08 NOTE — ED Notes (Signed)
Patient refused rectal temp. This RN will do oral temp when patient comes back from CT.

## 2018-06-08 NOTE — ED Triage Notes (Signed)
Patient arrived by EMS from [facility]. Pt arrived with a sacral wound that staff at [facility] reported it's starting to form blood clots. Staff reported blood clots being on the bed.   BP 122/50, HR 69, SpO2 100% on RA.

## 2018-06-08 NOTE — ED Notes (Signed)
Provider at bedside

## 2018-06-08 NOTE — Progress Notes (Signed)
Pharmacy Note   A consult was received from an ED physician for vancomycin per pharmacy dosing.    The patient's profile has been reviewed for ht/wt/allergies/indication/available labs.    A one time order has been placed for vancomycin 1500 mg IV x1 .  Further antibiotics/pharmacy consults should be ordered by admitting physician if indicated.                       Thank you,   Royetta Asal, PharmD, BCPS Pager [phone] 06/08/2018 5:41 PM

## 2018-06-08 NOTE — H&P (Signed)
Patricia Mata IOX:[id number] DOB: [birth year] DOA: 06/08/2018     PCP: Patricia Chard, MD   Outpatient Specialists:   CARDS:  Dr. Gwenlyn Mata    Oncology  Dr. Alen Mata    Patient arrived to ER on 06/08/18 at 1344  Patient coming from:  From facility [facility]  Chief Complaint:  Chief Complaint  Patient presents with  . Wound Check    HPI: Patricia Mata is a 62 y.o. female with medical history significant of severe pulmonary hypertension colon cancer (in remission, no active tx), diabetes, hypertension, anemia, myelodysplastic syndrome, severe AS    Presented with  Sacral wound developed after her ICU stay when she was admitted from 20 October to 12 November she was discharged to rehab facility r she is been having blood clots Mata in the dressings no purulent discharge, no foul smell,  noted no chills or fevers. At the time of discharge wound care has seen her and recommended hydrotherapy Noted to have anemia she has history of anemia in the past requiring blood transfusions every 2-week   Wound care specialist came to see her at facility Dr. Renard Mata tried to cauterize it 4 days ago but the bleeding did not stop.   No BM for the past 5-6 days Decreased PO intake she has been eating  A lot of ice No chest pain no shortness of breath.    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   MS and severe PH with RVSP 72mmHG. She is not a candidate for intervention of AS or MS with advanced PAH. on sildenafil Patient has had discussion with palliative care currently DNR with plan to transition to hospice care eventually Moderate aortic stenosis and paroxysmal atrial fibrillation on amiodarone not on anticoagulation secondary to history of severe anemia requiring repeated transfusions She requires transfusions every 2-3 weeks as an outpatient. History of  chronic diastolic CHF on   Lasix [facility] 05/21/18  - No angiographically significant CAD.    While in ER:  The following Work up has been ordered so far:  Orders Placed This Encounter  Procedures  . CT PELVIS WO CONTRAST  . CBC with Differential  . Basic metabolic panel  . Urinalysis, Routine w reflex microscopic  . Occult blood card to lab, stool RN will collect  . Vitamin B12  . Folate  . Iron and TIBC  . Ferritin  . Reticulocytes  . Check Rectal Temperature  . Orthostatic vital signs  . Complete patient signature process for consent form  . Practitioner attestation of consent  . Cardiac monitoring  . Consult to hospitalist  . Consult to general surgery  . ED EKG  . EKG 12-Lead  . EKG 12-Lead  . Type and screen Patricia Mata  . Prepare RBC  . Admit to Inpatient (patient's expected length of stay will be greater than 2 midnights or inpatient only procedure)    Following Medications were ordered in ER: Medications  morphine 4 MG/ML injection 4 mg (4 mg Intravenous Given 06/08/18 1713)  sodium chloride 0.9 % bolus 500 mL (0 mLs Intravenous Stopped 06/08/18 1827)  0.9 %  sodium chloride infusion (0 mL/hr Intravenous Stopped 06/08/18 2041)  vancomycin (VANCOCIN) 1,500 mg in sodium chloride 0.9 % 500 mL IVPB (0 mg Intravenous Stopped 06/08/18 2030)    Significant initial  Findings: Abnormal Labs Reviewed  CBC WITH DIFFERENTIAL/PLATELET - Abnormal; Notable for  the following components:      Result Value   WBC 12.0 (*)    RBC 2.29 (*)    Hemoglobin 6.9 (*)    HCT 21.7 (*)    RDW 17.7 (*)    Neutro Abs 9.3 (*)    Monocytes Absolute 1.7 (*)    All other components within normal limits  BASIC METABOLIC PANEL - Abnormal; Notable for the following components:   Sodium 132 (*)    CO2 21 (*)    Glucose, Bld 151 (*)    BUN 60 (*)    Creatinine, Ser 2.15 (*)    GFR calc non Af Amer 23 (*)    GFR calc Af Amer 27 (*)    All other components within normal  limits    Lactic Acid, Venous    Component Value Date/Time   LATICACIDVEN 3.9 (HH) 05/13/2018 0333    Na 132 K 4.7  Cr  Up from baseline see below Lab Results  Component Value Date   CREATININE 2.15 (H) 06/08/2018   CREATININE 1.27 (H) 06/01/2018   CREATININE 0.79 05/26/2018     WBC  12  HG/HCT   Down  from baseline see below    Component Value Date/Time   HGB 6.9 (LL) 06/08/2018 1526   HGB 9.5 (L) 06/01/2018 1014   HGB 6.3 (LL) 06/10/2017 1028   HGB 5.7 (LL) 05/26/2017 0949   HCT 21.7 (L) 06/08/2018 1526   HCT 19.0 (L) 06/10/2017 1028   HCT 17.0 (L) 05/26/2017 0949      Troponin (Point of Care Test) No results for input(s): TROPIPOC in the last 72 hours.    BNP (last 3 results) Recent Labs    05/11/18 0650 05/13/18 0333  BNP 2,681.1* 3,571.3*    ProBNP (last 3 results) No results for input(s): PROBNP in the last 8760 hours.    UA not ordered   CTabd/pelvis - sacral Decubitus ulcer No evidence for associated osteomyelitis   ECG:  Personally reviewed by me showing: HR : 55 Rhythm A.fib.   no evidence of ischemic changes QTC 465    ED Triage Vitals  Enc Vitals Group     BP 06/08/18 1357 (!) 118/48     Pulse Rate 06/08/18 1357 60     Resp 06/08/18 1357 11     Temp 06/08/18 1357 99 F (37.2 C)     Temp Source 06/08/18 1748 Oral     SpO2 06/08/18 1357 99 %     Weight 06/08/18 1402 187 lb (84.8 kg)     Height 06/08/18 1402 5\' 4"  (1.626 m)     Head Circumference --      Peak Flow --      Pain Score 06/08/18 1402 5     Pain Loc --      Pain Edu? --      Excl. in [place]? --   TMAX(24)@       Latest  Blood pressure (!) 166/44, pulse 65, temperature 98.3 F (36.8 C), temperature source Oral, resp. rate 18, height 5\' 4"  (1.626 m), weight 84.8 kg, SpO2 100 %.   ER Provider Called:      Dr. Gershon Mata    Will see in AM   Hospitalist was called for admission for acute renal failure, anemia, bleeding from sacral decube   Review of Systems:    Pertinent  positives include: sacral wound  Constitutional:  No weight loss, night sweats, Fevers, chills, fatigue, weight loss  HEENT:  No headaches, Difficulty swallowing,Tooth/dental problems,Sore throat,  No sneezing, itching, ear ache, nasal congestion, post nasal drip,  Cardio-vascular:  No chest pain, Orthopnea, PND, anasarca, dizziness, palpitations.no Bilateral lower extremity swelling  GI:  No heartburn, indigestion, abdominal pain, nausea, vomiting, diarrhea, change in bowel habits, loss of appetite, melena, blood in stool, hematemesis Resp:  no shortness of breath at rest. No dyspnea on exertion, No excess mucus, no productive cough, No non-productive cough, No coughing up of blood.No change in color of mucus.No wheezing. Skin:  no rash or lesions. No jaundice GU:  no dysuria, change in color of urine, no urgency or frequency. No straining to urinate.  No flank pain.  Musculoskeletal:  No joint pain or no joint swelling. No decreased range of motion. No back pain.  Psych:  No change in mood or affect. No depression or anxiety. No memory loss.  Neuro: no localizing neurological complaints, no tingling, no weakness, no double vision, no gait abnormality, no slurred speech, no confusion  All systems reviewed and apart from [place] all are negative  Past Medical History:   Past Medical History:  Diagnosis Date  . Cancer [facility]) 2008   Colon   . Diabetes mellitus without complication ([place])   . Hypertension   . Macrocytic anemia       Past Surgical History:  Procedure Laterality Date  . COLON SURGERY    . IR FLUORO GUIDE PORT INSERTION RIGHT  04/02/2017  . IR US GUIDE VASC ACCESS RIGHT  04/02/2017  . RIGHT/LEFT HEART CATH AND CORONARY ANGIOGRAPHY N/A 05/21/2018   Procedure: RIGHT/LEFT HEART CATH AND CORONARY ANGIOGRAPHY;  Surgeon: Nelva Bush, MD;  Location: [facility];  Service: Cardiovascular;  Laterality: N/A;    Social History:  Ambulatory  [place]     reports  that she quit smoking about 17 years ago. Her smoking use included cigarettes. She has quit using smokeless tobacco. She reports that she drinks about 2.0 standard drinks of alcohol per week. She reports that she does not use drugs.     Family History:   Family History  Problem Relation Age of Onset  . Hypertension Mother   . Heart attack Father   . Hypertension Sister   . Hypertension Brother   . Hypertension Sister   . Hypertension Sister   . Prostate cancer Brother   . HIV/AIDS Brother     Allergies: Allergies  Allergen Reactions  . Ancef [Cefazolin] Itching    Severe itching- after procedure, ancef was the antibiotic.-04/02/17  . Sulfa Antibiotics Rash and Other (See Comments)    Blisters, also     Prior to Admission medications   Medication Sig Start Date End Date Taking? Authorizing Provider  amiodarone (PACERONE) 200 MG tablet Take 1 tablet (200 mg total) by mouth 2 (two) times daily. 05/26/18  Yes Georgiana Shore, NP  atorvastatin (LIPITOR) 40 MG tablet Take 1 tablet (40 mg total) by mouth daily. 04/23/18  Yes [name], FNP  collagenase (SANTYL) ointment Apply topically daily. 05/27/18  Yes Georgiana Shore, NP  furosemide (LASIX) 40 MG tablet Take 1 tablet (40 mg total) by mouth 2 (two) times daily. 05/26/18  Yes Georgiana Shore, NP  hydrALAZINE (APRESOLINE) 100 MG tablet Take 1 tablet (100 mg total) by mouth 3 (three) times daily. 06/04/18  Yes Shirley Friar, PA-C  insulin glargine (LANTUS) 100 unit/mL SOPN Inject 0.14 mLs (14 Units total) into the skin daily after breakfast. 06/16/17  Yes Bonnielee Haff, MD  JANUMET XR 50-500 MG TB24 Take 1 tablet by mouth daily. With evening meal 02/07/18  Yes [provider]  latanoprost (XALATAN) 0.005 % ophthalmic solution Place 1 drop into both eyes at bedtime.    Yes [provider]  metoprolol succinate (TOPROL-XL) 25 MG 24 hr tablet Take 1 tablet (25 mg total) by mouth 2 (two) times daily. 05/26/18   Yes Georgiana Shore, NP  metroNIDAZOLE (FLAGYL) 250 MG tablet Take 250 mg by mouth daily.   Yes [provider]  Multiple Vitamins-Minerals (CENTRUM SILVER 50+WOMEN) TABS Take 1 tablet by mouth daily.   Yes [provider]  mupirocin ointment (BACTROBAN) 2 % Place 1 application into the nose daily.   Yes [provider]  oxycodone-acetaminophen (PERCOCET) 2.5-325 MG tablet Take 1 tablet by mouth daily as needed for pain.   Yes [provider]  pantoprazole (PROTONIX) 40 MG tablet Take 1 tablet (40 mg total) by mouth daily. 05/27/18  Yes Georgiana Shore, NP  sildenafil (REVATIO) 20 MG tablet Take 1 tablet (20 mg total) by mouth 3 (three) times daily. 05/26/18  Yes Georgiana Shore, NP  sodium hypochlorite (DAKIN'S 1/2 STRENGTH) external solution Irrigate with 1 application as directed daily.   Yes [provider]  spironolactone (ALDACTONE) 25 MG tablet Take 0.5 tablets (12.5 mg total) by mouth daily. 05/27/18  Yes Georgiana Shore, NP  traMADol (ULTRAM) 50 MG tablet Take 50 mg by mouth daily.   Yes [provider]  feeding supplement, GLUCERNA SHAKE, (GLUCERNA SHAKE) LIQD Take 237 mLs by mouth 3 (three) times daily between meals. Patient not taking: Reported on 06/08/2018 05/26/18   Georgiana Shore, NP  Hydrocortisone (GERHARDT'S BUTT CREAM) CREA Apply 1 application topically 2 (two) times daily. Patient not taking: Reported on 06/08/2018 05/26/18   Georgiana Shore, NP  Insulin Pen Needle 29G X 5MM MISC Use as directed 06/16/17   Bonnielee Haff, MD  lidocaine-prilocaine (EMLA) cream Apply a quarter size of cream to port 1-2 hours prior to access. Cover with saran wrap. Patient not taking: Reported on 06/08/2018 04/17/17   [name], MD  potassium chloride SA (K-DUR,KLOR-CON) 20 MEQ tablet Take 1 tablet (20 mEq total) by mouth 2 (two) times daily. Patient not taking: Reported on 06/08/2018 05/26/18   Georgiana Shore, NP   Physical  Exam: Blood pressure (!) 166/44, pulse 65, temperature 98.3 F (36.8 C), temperature source Oral, resp. rate 18, height 5\' 4"  (1.626 m), weight 84.8 kg, SpO2 100 %. 1. General:  in No Acute distress   Chronically ill  -appearing 2. Psychological: Alert and  Oriented 3. Head/ENT:    Dry Mucous Membranes                          Head Non traumatic, neck supple                            Poor Dentition 4. SKIN decreased Skin turgor,  Skin clean       5. Heart: Regular rate and rhythm loud systolic murmur, no Rub or gallop 6. Lungs:  Clear to auscultation bilaterally, no wheezes or crackles   7. Abdomen: Soft, non-tender, Non distended   obese  bowel sounds present 8. Lower extremities: no clubbing, cyanosis, or  edema 9. Neurologically Grossly intact, moving all 4 extremities equally  10. MSK: Normal range of motion   LABS:  Recent Labs  Lab 06/08/18 1526  WBC 12.0*  NEUTROABS 9.3*  HGB 6.9*  HCT 21.7*  MCV 94.8  PLT 161   Basic Metabolic Panel: Recent Labs  Lab 06/08/18 1526  NA 132*  K 4.7  CL 101  CO2 21*  GLUCOSE 151*  BUN 60*  CREATININE 2.15*  CALCIUM 9.1      No results for input(s): AST, ALT, ALKPHOS, BILITOT, PROT, ALBUMIN in the last 168 hours. No results for input(s): LIPASE, AMYLASE in the last 168 hours. No results for input(s): AMMONIA in the last 168 hours.    HbA1C: No results for input(s): HGBA1C in the last 72 hours. CBG: No results for input(s): GLUCAP in the last 168 hours.    Urine analysis:    Component Value Date/Time   COLORURINE AMBER (A) 05/14/2018 0034   APPEARANCEUR HAZY (A) 05/14/2018 0034   LABSPEC 1.017 05/14/2018 0034   PHURINE 5.0 05/14/2018 0034   GLUCOSEU NEGATIVE 05/14/2018 0034   HGBUR NEGATIVE 05/14/2018 0034   BILIRUBINUR NEGATIVE 05/14/2018 0034   KETONESUR 5 (A) 05/14/2018 0034   PROTEINUR 100 (A) 05/14/2018 0034   NITRITE NEGATIVE 05/14/2018 0034   LEUKOCYTESUR NEGATIVE 05/14/2018 0034       Cultures:    Component Value Date/Time   SDES URINE, CATHETERIZED 05/14/2018 0034   SPECREQUEST NONE 05/14/2018 0034   CULT  05/14/2018 0034    NO GROWTH Performed at [facility], [place] [address] [place], [place] [address]    REPTSTATUS 05/15/2018 FINAL 05/14/2018 0034     Radiological Exams on Admission: Ct Pelvis Wo Contrast  Result Date: 06/08/2018 CLINICAL DATA:  Initial evaluation for sacral decubitus ulcer. EXAM: CT PELVIS WITHOUT CONTRAST TECHNIQUE: Multidetector CT imaging of the pelvis was performed following the standard protocol without intravenous contrast. COMPARISON:  Prior CT from 05/08/2017. FINDINGS: Urinary Tract: Punctate nonobstructive calculi noted within the partially visualized lower pole the right kidney. Visualized kidneys otherwise unremarkable. Visualized ureters of normal caliber without abnormality. Bladder moderately distended without acute abnormality. Bowel: Anastomotic suture present at the right colon. Visualized bowels of normal caliber without evidence for obstruction. Moderate to large volume retained stool within the distal colon and rectal vault, suggesting constipation. No acute inflammatory changes about the visualized bowels. Mild colonic diverticulosis without evidence for acute diverticulitis. Vascular/Lymphatic: Extensive aorto bi-iliac atherosclerotic disease with extensive vascular calcifications throughout the pelvic vasculature. No adenopathy. Reproductive: Multiple calcifications within the uterus likely reflect small calcified fibroids. Uterus and ovaries otherwise within normal limits. Other: No free air or fluid. Small irregular fat containing paraumbilical hernia with associated suture. Musculoskeletal: Mild diffuse anasarca. Visualized musculature of the pelvis and upper thighs atrophic in appearance. Sacral decubitus ulcer seen at the lower midline (series 10, image 119). Ulceration extends to the level of the underlying coccyx. No  osseous erosion or periosteal reaction to suggest osteomyelitis within the underlying coccyx. No loculated collections identified on this noncontrast examination. No abnormal soft tissue emphysema. No acute osseous abnormality. No discrete lytic or blastic osseous lesions. Degenerative changes noted within the visualized lower lumbar spine. IMPRESSION: 1. Sacral decubitus ulcer at the mid posterior pelvis, extending to the level of the underlying coccyx. No evidence for associated osteomyelitis by CT. No discrete or drainable collections identified. 2. Moderate to large volume stool within the visualized colon, consistent with constipation. 3. Nonobstructive right renal nephrolithiasis. 4. Extensive atherosclerosis. Electronically Signed   By: Jeannine Boga M.D.   On: 06/08/2018 18:17    Chart has  been reviewed   Assessment/Plan  62 y.o. female with medical history significant of severe pulmonary hypertension colon cancer (in remission, no active tx), diabetes, hypertension, anemia, myelodysplastic syndrome, severe AS  Admitted for chronic anemia secondary to recurrent bleeding from decubitus ulcer in the setting of myelodysplastic syndrome requiring frequent transfusions.  Dehydration secondary to poor p.o. intake  Present on Admission: . Symptomatic anemia likely secondary to recurrent bleeding from decubitus ulcer.  Will transfuse and follow CBC obtain anemia panel   . Sacral wound -patient has persistent bleeding for few weeks now resulting in worsening anemia.  ER spoke with general surgery who will evaluate patient for possible definitive management of recurrent bleeding. No evidence of underlying infection patient has been on metronidazole continue for now She did receive a dose of vancomycin in the emergency department   . MDS (myelodysplastic syndrome) ([facility]) chronic ongoing patient has frequent need for blood transfusions which possibly contributed to current anemia  . Essential  hypertension stable continue home medications given somewhat low heart rate will change to  toprolol to meToprol with holding parameters . Hyperlipidemia they will continue home medications . Carotid artery disease ([place]) stable recent cardiac cath showed no significant coronary artery disease . Iron deficiency anemia due to chronic blood loss transfuse and follow . Acute renal failure (ARF) ([place]) likely secondary to dehydration we will obtain urine electrolytes rehydrate and follow . Hyponatremia in the setting of decreased p.o. intake and dehydration will obtain urinary electrolytes gentle hydration follow  . Constipation will treat with bowel regimen  DM 2-  - Order Sensitive  SSI   - continue home insulin regimen    -  check TSH and HgA1C  - Hold by mouth medications   Poor p.o. intake order nutritional consult History of pulmonary hypertension we will continue home medications Other plan as per orders.  DVT prophylaxis:  SCD    Code Status:   DNR/DNI as per patient   I had personally discussed CODE STATUS with patient and family     Family Communication:   Family   at  Bedside  plan of care was discussed with   Son  Husband,    Disposition Plan:    Back to current facility when stable                                                 Would benefit from PT/OT eval prior to DC  Ordered                     Social Work  consulted                   Nutrition    consulted                  Wound care  consulted                                      Consults called:  General surgery  Admission status:   inpatient     Expect 2 midnight stay secondary to severity of patient's current illness including      Severe lab/radiological abnormalities including:   Large decubitus ulcer significant hemoglobin drop secondary to recurrent bleeding and extensive comorbidities  including:  DM2   CHF Severe pulmonary hypertension   malignancy,   That are currently affecting medical  management.  I expect  patient to be hospitalized for 2 midnights requiring inpatient medical care.  Patient is at high risk for adverse outcome (such as loss of life or disability) if not treated.  Indication for inpatient stay as follows:        Initial ability to tolerate p.o. Resulting in  dehydration Need for operative/procedural  intervention   IV fluids,        Level of care        medical floor           Toy Baker 06/08/2018, 11:49 PM    [facility]  Pager [phone]   after 2 AM please page floor coverage PA If 7AM-7PM, please contact the day team taking care of the patient  [email or web address]  Password [email or web address]

## 2018-06-09 ENCOUNTER — Inpatient Hospital Stay (HOSPITAL_COMMUNITY): Payer: BC Managed Care – PPO

## 2018-06-09 LAB — COMPREHENSIVE METABOLIC PANEL
ALT: 30 U/L (ref 0–44)
AST: 43 U/L — ABNORMAL HIGH (ref 15–41)
Albumin: 2.9 g/dL — ABNORMAL LOW (ref 3.5–5.0)
Alkaline Phosphatase: 110 U/L (ref 38–126)
Anion gap: 9 (ref 5–15)
BUN: 58 mg/dL — ABNORMAL HIGH (ref 8–23)
CO2: 22 mmol/L (ref 22–32)
Calcium: 9.2 mg/dL (ref 8.9–10.3)
Chloride: 105 mmol/L (ref 98–111)
Creatinine, Ser: 1.58 mg/dL — ABNORMAL HIGH (ref 0.44–1.00)
GFR calc Af Amer: 40 mL/min — ABNORMAL LOW (ref >60)
GFR calc non Af Amer: 34 mL/min — ABNORMAL LOW (ref >60)
Glucose, Bld: 100 mg/dL — ABNORMAL HIGH (ref 70–99)
Potassium: 4.3 mmol/L (ref 3.5–5.1)
Sodium: 136 mmol/L (ref 135–145)
Total Bilirubin: 2.1 mg/dL — ABNORMAL HIGH (ref 0.3–1.2)
Total Protein: 7 g/dL (ref 6.5–8.1)

## 2018-06-09 LAB — CBC
HCT: 23.4 % — ABNORMAL LOW (ref 36.0–46.0)
Hemoglobin: 7.7 g/dL — ABNORMAL LOW (ref 12.0–15.0)
MCH: 30.9 pg (ref 26.0–34.0)
MCHC: 32.9 g/dL (ref 30.0–36.0)
MCV: 94 fL (ref 80.0–100.0)
Platelets: 289 10*3/uL (ref 150–400)
RBC: 2.49 MIL/uL — ABNORMAL LOW (ref 3.87–5.11)
RDW: 16.6 % — ABNORMAL HIGH (ref 11.5–15.5)
WBC: 9.5 10*3/uL (ref 4.0–10.5)
nRBC: 0 % (ref 0.0–0.2)

## 2018-06-09 LAB — CBG MONITORING, ED
Glucose-Capillary: 92 mg/dL (ref 70–99)
Glucose-Capillary: 93 mg/dL (ref 70–99)

## 2018-06-09 LAB — PHOSPHORUS: Phosphorus: 4.9 mg/dL — ABNORMAL HIGH (ref 2.5–4.6)

## 2018-06-09 LAB — IRON AND TIBC
Iron: 147 ug/dL (ref 28–170)
Saturation Ratios: 82 % — ABNORMAL HIGH (ref 10.4–31.8)
TIBC: 180 ug/dL — ABNORMAL LOW (ref 250–450)
UIBC: 33 ug/dL

## 2018-06-09 LAB — FOLATE: Folate: 9.1 ng/mL (ref >5.9)

## 2018-06-09 LAB — GLUCOSE, CAPILLARY
Glucose-Capillary: 115 mg/dL — ABNORMAL HIGH (ref 70–99)
Glucose-Capillary: 186 mg/dL — ABNORMAL HIGH (ref 70–99)
Glucose-Capillary: 93 mg/dL (ref 70–99)

## 2018-06-09 LAB — HEMOGLOBIN A1C
Hgb A1c MFr Bld: 6.3 % — ABNORMAL HIGH (ref 4.8–5.6)
Mean Plasma Glucose: 134.11 mg/dL

## 2018-06-09 LAB — PREALBUMIN: Prealbumin: 15.4 mg/dL — ABNORMAL LOW (ref 18–38)

## 2018-06-09 LAB — T4, FREE: Free T4: 0.54 ng/dL — ABNORMAL LOW (ref 0.82–1.77)

## 2018-06-09 LAB — FERRITIN: Ferritin: 5614 ng/mL — ABNORMAL HIGH (ref 11–307)

## 2018-06-09 LAB — PREPARE RBC (CROSSMATCH)

## 2018-06-09 LAB — MAGNESIUM: Magnesium: 1.4 mg/dL — ABNORMAL LOW (ref 1.7–2.4)

## 2018-06-09 LAB — VITAMIN B12: Vitamin B-12: 853 pg/mL (ref 180–914)

## 2018-06-09 LAB — TSH: TSH: 61.191 u[IU]/mL — ABNORMAL HIGH (ref 0.350–4.500)

## 2018-06-09 MED ORDER — MILK AND MOLASSES ENEMA
1.0000 | RECTAL | Status: DC | PRN
  Filled 2018-06-09: qty 250

## 2018-06-09 MED ORDER — INSULIN ASPART 100 UNIT/ML ~~LOC~~ SOLN
0.0000 [IU] | SUBCUTANEOUS | Status: DC
  Administered 2018-06-09 – 2018-06-10 (×3): 2 [IU] via SUBCUTANEOUS
  Administered 2018-06-10: 5 [IU] via SUBCUTANEOUS
  Administered 2018-06-11: 2 [IU] via SUBCUTANEOUS
  Administered 2018-06-11: 3 [IU] via SUBCUTANEOUS
  Administered 2018-06-11 – 2018-06-13 (×4): 1 [IU] via SUBCUTANEOUS
  Administered 2018-06-13 – 2018-06-14 (×2): 2 [IU] via SUBCUTANEOUS
  Administered 2018-06-14 – 2018-06-15 (×2): 1 [IU] via SUBCUTANEOUS

## 2018-06-09 MED ORDER — LATANOPROST 0.005 % OP SOLN
1.0000 [drp] | Freq: Every day | OPHTHALMIC | Status: DC
  Administered 2018-06-10 – 2018-06-14 (×5): 1 [drp] via OPHTHALMIC
  Filled 2018-06-09 (×2): qty 2.5

## 2018-06-09 MED ORDER — LEVOTHYROXINE SODIUM 25 MCG PO TABS
25.0000 ug | ORAL_TABLET | Freq: Every day | ORAL | Status: DC
  Administered 2018-06-09 – 2018-06-15 (×5): 25 ug via ORAL
  Filled 2018-06-09 (×5): qty 1

## 2018-06-09 MED ORDER — TRAMADOL HCL 50 MG PO TABS: 50.0000 mg | ORAL_TABLET | Freq: Four times a day (QID) | ORAL | Status: DC | PRN

## 2018-06-09 MED ORDER — SODIUM CHLORIDE 0.9 % IV SOLN: INTRAVENOUS | Status: AC

## 2018-06-09 MED ORDER — INSULIN GLARGINE 100 UNIT/ML ~~LOC~~ SOLN
14.0000 [IU] | Freq: Every day | SUBCUTANEOUS | Status: DC
  Administered 2018-06-09 – 2018-06-15 (×5): 14 [IU] via SUBCUTANEOUS
  Filled 2018-06-09 (×7): qty 0.14

## 2018-06-09 MED ORDER — POLYETHYLENE GLYCOL 3350 17 G PO PACK
17.0000 g | PACK | Freq: Two times a day (BID) | ORAL | Status: DC
  Administered 2018-06-09 – 2018-06-15 (×5): 17 g via ORAL
  Filled 2018-06-09 (×10): qty 1

## 2018-06-09 MED ORDER — ONDANSETRON HCL 4 MG/2ML IJ SOLN: 4.0000 mg | Freq: Four times a day (QID) | INTRAMUSCULAR | Status: DC | PRN

## 2018-06-09 MED ORDER — ACETAMINOPHEN 650 MG RE SUPP: 650.0000 mg | Freq: Four times a day (QID) | RECTAL | Status: DC | PRN

## 2018-06-09 MED ORDER — ATORVASTATIN CALCIUM 40 MG PO TABS
40.0000 mg | ORAL_TABLET | Freq: Every day | ORAL | Status: DC
  Administered 2018-06-09 – 2018-06-15 (×6): 40 mg via ORAL
  Filled 2018-06-09 (×6): qty 1

## 2018-06-09 MED ORDER — METRONIDAZOLE 500 MG PO TABS
250.0000 mg | ORAL_TABLET | Freq: Every day | ORAL | Status: DC
  Administered 2018-06-09 – 2018-06-11 (×3): 250 mg via ORAL
  Filled 2018-06-09 (×3): qty 1

## 2018-06-09 MED ORDER — AMIODARONE HCL 200 MG PO TABS
200.0000 mg | ORAL_TABLET | Freq: Two times a day (BID) | ORAL | Status: DC
  Administered 2018-06-09 – 2018-06-15 (×12): 200 mg via ORAL
  Filled 2018-06-09 (×13): qty 1

## 2018-06-09 MED ORDER — SODIUM CHLORIDE 0.9% IV SOLUTION
Freq: Once | INTRAVENOUS | Status: AC
  Administered 2018-06-09: 07:00:00 via INTRAVENOUS

## 2018-06-09 MED ORDER — HYDROCODONE-ACETAMINOPHEN 5-325 MG PO TABS
1.0000 | ORAL_TABLET | ORAL | Status: DC | PRN
  Administered 2018-06-10 – 2018-06-11 (×2): 1 via ORAL
  Administered 2018-06-12: 2 via ORAL
  Administered 2018-06-12 – 2018-06-13 (×4): 1 via ORAL
  Filled 2018-06-09: qty 2
  Filled 2018-06-09 (×6): qty 1

## 2018-06-09 MED ORDER — BISACODYL 10 MG RE SUPP: 10.0000 mg | Freq: Every day | RECTAL | Status: DC | PRN

## 2018-06-09 MED ORDER — MAGNESIUM SULFATE 2 GM/50ML IV SOLN
2.0000 g | Freq: Once | INTRAVENOUS | Status: AC
  Administered 2018-06-09: 2 g via INTRAVENOUS
  Filled 2018-06-09: qty 50

## 2018-06-09 MED ORDER — ONDANSETRON HCL 4 MG PO TABS
4.0000 mg | ORAL_TABLET | Freq: Four times a day (QID) | ORAL | Status: DC | PRN
  Filled 2018-06-09: qty 1

## 2018-06-09 MED ORDER — SILDENAFIL CITRATE 20 MG PO TABS
20.0000 mg | ORAL_TABLET | Freq: Three times a day (TID) | ORAL | Status: DC
  Administered 2018-06-09 – 2018-06-15 (×16): 20 mg via ORAL
  Filled 2018-06-09 (×21): qty 1

## 2018-06-09 MED ORDER — PANTOPRAZOLE SODIUM 40 MG PO TBEC
40.0000 mg | DELAYED_RELEASE_TABLET | Freq: Every day | ORAL | Status: DC
  Administered 2018-06-10 – 2018-06-15 (×4): 40 mg via ORAL
  Filled 2018-06-09 (×4): qty 1

## 2018-06-09 MED ORDER — SILVER NITRATE-POT NITRATE 75-25 % EX MISC
10.0000 "application " | CUTANEOUS | Status: DC | PRN
  Filled 2018-06-09: qty 10

## 2018-06-09 MED ORDER — METOPROLOL TARTRATE 12.5 MG HALF TABLET
12.5000 mg | ORAL_TABLET | Freq: Two times a day (BID) | ORAL | Status: DC
  Administered 2018-06-09 – 2018-06-15 (×12): 12.5 mg via ORAL
  Filled 2018-06-09 (×12): qty 1

## 2018-06-09 MED ORDER — ACETAMINOPHEN 325 MG PO TABS: 650.0000 mg | ORAL_TABLET | Freq: Four times a day (QID) | ORAL | Status: DC | PRN

## 2018-06-09 MED ORDER — HYDRALAZINE HCL 50 MG PO TABS
100.0000 mg | ORAL_TABLET | Freq: Three times a day (TID) | ORAL | Status: DC
  Administered 2018-06-09 – 2018-06-15 (×18): 100 mg via ORAL
  Filled 2018-06-09 (×19): qty 2

## 2018-06-09 MED ORDER — GADOBUTROL 1 MMOL/ML IV SOLN
8.0000 mL | Freq: Once | INTRAVENOUS | Status: AC | PRN
  Administered 2018-06-09: 8 mL via INTRAVENOUS

## 2018-06-09 MED ORDER — COLLAGENASE 250 UNIT/GM EX OINT
TOPICAL_OINTMENT | Freq: Every day | CUTANEOUS | Status: DC
  Filled 2018-06-09: qty 90

## 2018-06-09 NOTE — Consult Note (Addendum)
[facility] Surgery Consult Note  [name] 09-01-1955  [id number].    Requesting MD: Leanord Hawking Chief Complaint/Reason for Consult: sacral wound  HPI:  Patricia Mata is a 62yo female PMH severe pulmonary hypertension, AS, h/o colon cancers/p right hemicolectomy 2008 Dr. Hulen Skains, DM2, HTN, and myelodysplastic syndrome with transfusion dependent anemia (requires blood transfusion about every 3 weeks), who was brought to [facility] from [facility] for evaluation of sacral wound. Patient reports recent hospital admission 05/11/18 through 05/26/18 for AF with RVR and recurrent anemia where she developed respiratory failure and required intubation. She was transfused and placed on IV amio. Subsequently developed AKI/ESRD and placed on CVVHD. Converted to NSR on amio and weaned offthe vent. Renal function now back to normal and off CVVHD.  States that during this admission she developed a sacral wound. She has been receiving wound care at her rehab facility but symptoms have worsened. She is concerned because she has had bleeding from the area on/off for over a week. States that the wound was cauterized a few days ago but continues to have intermittent bleeding. Denies fever, chills, or purulent drainage from wound. She does report recent chills and fatigue/malaise. Works with therapy daily. Currently using walker for ambulation.  Hemoglobin found to be 6.9 in the ED, and she is currently receiving 2 units PRBCs. General surgery asked to see.  Nonsmoker Retired Control and instrumentation engineer  ROS: Review of Systems  Constitutional: Positive for chills and malaise/fatigue. Negative for fever.  HENT: Negative.   Eyes: Negative.   Respiratory: Negative.   Cardiovascular: Negative.   Gastrointestinal: Negative.  Negative for abdominal pain, blood in stool, melena, nausea and vomiting.  Genitourinary: Negative.   Musculoskeletal: Negative.   Skin:       Sacral wound  Neurological: Negative.    All  systems reviewed and otherwise negative except for as above  Family History  Problem Relation Age of Onset  . Hypertension Mother   . Heart attack Father   . Hypertension Sister   . Hypertension Brother   . Hypertension Sister   . Hypertension Sister   . Prostate cancer Brother   . HIV/AIDS Brother     Past Medical History:  Diagnosis Date  . Cancer [facility]) 2008   Colon   . Diabetes mellitus without complication ([place])   . Hypertension   . Macrocytic anemia     Past Surgical History:  Procedure Laterality Date  . COLON SURGERY    . IR FLUORO GUIDE PORT INSERTION RIGHT  04/02/2017  . IR US GUIDE VASC ACCESS RIGHT  04/02/2017  . RIGHT/LEFT HEART CATH AND CORONARY ANGIOGRAPHY N/A 05/21/2018   Procedure: RIGHT/LEFT HEART CATH AND CORONARY ANGIOGRAPHY;  Surgeon: Nelva Bush, MD;  Location: [place] [facility];  Service: Cardiovascular;  Laterality: N/A;    Social History:  reports that she quit smoking about 17 years ago. Her smoking use included cigarettes. She has quit using smokeless tobacco. She reports that she drinks about 2.0 standard drinks of alcohol per week. She reports that she does not use drugs.  Allergies:  Allergies  Allergen Reactions  . Ancef [Cefazolin] Itching    Severe itching- after procedure, ancef was the antibiotic.-04/02/17  . Sulfa Antibiotics Rash and Other (See Comments)    Blisters, also     (Not in a hospital admission)  Prior to Admission medications   Medication Sig Start Date End Date Taking? Authorizing Provider  amiodarone (PACERONE) 200 MG tablet Take 1 tablet (200 mg total) by  mouth 2 (two) times daily. 05/26/18  Yes Georgiana Shore, NP  atorvastatin (LIPITOR) 40 MG tablet Take 1 tablet (40 mg total) by mouth daily. 04/23/18  Yes [name], FNP  collagenase (SANTYL) ointment Apply topically daily. 05/27/18  Yes Georgiana Shore, NP  furosemide (LASIX) 40 MG tablet Take 1 tablet (40 mg total) by mouth 2 (two) times daily. 05/26/18   Yes Georgiana Shore, NP  hydrALAZINE (APRESOLINE) 100 MG tablet Take 1 tablet (100 mg total) by mouth 3 (three) times daily. 06/04/18  Yes Shirley Friar, PA-C  insulin glargine (LANTUS) 100 unit/mL SOPN Inject 0.14 mLs (14 Units total) into the skin daily after breakfast. 06/16/17  Yes Bonnielee Haff, MD  JANUMET XR 50-500 MG TB24 Take 1 tablet by mouth daily. With evening meal 02/07/18  Yes [provider]  latanoprost (XALATAN) 0.005 % ophthalmic solution Place 1 drop into both eyes at bedtime.    Yes [provider]  metoprolol succinate (TOPROL-XL) 25 MG 24 hr tablet Take 1 tablet (25 mg total) by mouth 2 (two) times daily. 05/26/18  Yes Georgiana Shore, NP  metroNIDAZOLE (FLAGYL) 250 MG tablet Take 250 mg by mouth daily.   Yes [provider]  Multiple Vitamins-Minerals (CENTRUM SILVER 50+WOMEN) TABS Take 1 tablet by mouth daily.   Yes [provider]  mupirocin ointment (BACTROBAN) 2 % Place 1 application into the nose daily.   Yes [provider]  oxycodone-acetaminophen (PERCOCET) 2.5-325 MG tablet Take 1 tablet by mouth daily as needed for pain.   Yes [provider]  pantoprazole (PROTONIX) 40 MG tablet Take 1 tablet (40 mg total) by mouth daily. 05/27/18  Yes Georgiana Shore, NP  sildenafil (REVATIO) 20 MG tablet Take 1 tablet (20 mg total) by mouth 3 (three) times daily. 05/26/18  Yes Georgiana Shore, NP  sodium hypochlorite (DAKIN'S 1/2 STRENGTH) external solution Irrigate with 1 application as directed daily.   Yes [provider]  spironolactone (ALDACTONE) 25 MG tablet Take 0.5 tablets (12.5 mg total) by mouth daily. 05/27/18  Yes Georgiana Shore, NP  traMADol (ULTRAM) 50 MG tablet Take 50 mg by mouth daily.   Yes [provider]  feeding supplement, GLUCERNA SHAKE, (GLUCERNA SHAKE) LIQD Take 237 mLs by mouth 3 (three) times daily between meals. Patient not taking: Reported on 06/08/2018 05/26/18   Georgiana Shore, NP  Hydrocortisone (GERHARDT'S BUTT CREAM) CREA Apply 1 application topically 2 (two) times daily. Patient not taking: Reported on 06/08/2018 05/26/18   Georgiana Shore, NP  Insulin Pen Needle 29G X 5MM MISC Use as directed 06/16/17   Bonnielee Haff, MD  lidocaine-prilocaine (EMLA) cream Apply a quarter size of cream to port 1-2 hours prior to access. Cover with saran wrap. Patient not taking: Reported on 06/08/2018 04/17/17   [name], MD  potassium chloride SA (K-DUR,KLOR-CON) 20 MEQ tablet Take 1 tablet (20 mEq total) by mouth 2 (two) times daily. Patient not taking: Reported on 06/08/2018 05/26/18   Georgiana Shore, NP    Blood pressure (!) 153/97, pulse 60, temperature 98.4 F (36.9 C), resp. rate 16, height '5\' 4"'  (1.626 m), weight 84.8 kg, SpO2 100 %. Physical Exam: General: pleasant, WD/WN AA female who is laying in bed in NAD HEENT: head is normocephalic, atraumatic.  Sclera are noninjected.  Pupils equal and round.  Ears and nose without any masses or lesions.  Mouth is pink and moist. Dentition fair Heart: regular, rate,  and rhythm.  +murmur.  Palpable pedal pulses bilaterally Lungs: CTAB, no wheezes, rhonchi, or rales noted.  Respiratory effort nonlabored Abd: soft, NT/ND, +BS, no masses, hernias, or organomegaly MS: trace edema BLE, no gross deformities Skin: warm and dry Psych: A&Ox3 with an appropriate affect. Neuro: cranial nerves grossly intact, extremity CSM intact bilaterally, normal speech GU: sacral wound with 8x10x2cm area beefy red, central aspect of wound deeper with palpable bone and some green drainage/slough, no active bleeding, no tunneling  Results for orders placed or performed during the hospital encounter of 06/08/18 (from the past 48 hour(s))  CBC with Differential     Status: Abnormal   Collection Time: 06/08/18  3:26 PM  Result Value Ref Range   WBC 12.0 (H) 4.0 - 10.5 K/uL   RBC 2.29 (L) 3.87 - 5.11 MIL/uL   Hemoglobin 6.9 (LL) 12.0 -  15.0 g/dL    Comment: This critical result has verified and been called to B.JESSE by Rosezella Rumpf on 11 25 2019 at 1657, and has been read back. CRITICAL RESULT VERIFIED   HCT 21.7 (L) 36.0 - 46.0 %   MCV 94.8 80.0 - 100.0 fL   MCH 30.1 26.0 - 34.0 pg   MCHC 31.8 30.0 - 36.0 g/dL   RDW 17.7 (H) 11.5 - 15.5 %   Platelets 339 150 - 400 K/uL   nRBC 0.0 0.0 - 0.2 %   Neutrophils Relative % 77 %   Neutro Abs 9.3 (H) 1.7 - 7.7 K/uL   Lymphocytes Relative 8 %   Lymphs Abs 0.9 0.7 - 4.0 K/uL   Monocytes Relative 14 %   Monocytes Absolute 1.7 (H) 0.1 - 1.0 K/uL   Eosinophils Relative 0 %   Eosinophils Absolute 0.1 0.0 - 0.5 K/uL   Basophils Relative 0 %   Basophils Absolute 0.0 0.0 - 0.1 K/uL   Immature Granulocytes 1 %   Abs Immature Granulocytes 0.07 0.00 - 0.07 K/uL    Comment: Performed at [facility], [place] [address] [place], [place] [address]  Basic metabolic panel     Status: Abnormal   Collection Time: 06/08/18  3:26 PM  Result Value Ref Range   Sodium 132 (L) 135 - 145 mmol/L   Potassium 4.7 3.5 - 5.1 mmol/L   Chloride 101 98 - 111 mmol/L   CO2 21 (L) 22 - 32 mmol/L   Glucose, Bld 151 (H) 70 - 99 mg/dL   BUN 60 (H) 8 - 23 mg/dL   Creatinine, Ser 2.15 (H) 0.44 - 1.00 mg/dL   Calcium 9.1 8.9 - 10.3 mg/dL   GFR calc non Af Amer 23 (L) >60 mL/min   GFR calc Af Amer 27 (L) >60 mL/min    Comment: (NOTE) The eGFR has been calculated using the CKD EPI equation. This calculation has not been validated in all clinical situations. eGFR's persistently <60 mL/min signify possible Chronic Kidney Disease.    Anion gap 10 5 - 15    Comment: Performed at [facility], [place] [address], [place], [place] [address]  Type and screen [place]     Status: None (Preliminary result)   Collection Time: 06/08/18  5:00 PM  Result Value Ref Range   ABO/RH(D) A NEG    Antibody Screen NEG    Sample Expiration 06/11/2018    Unit Number  [id number]    Blood Component Type RBC, LR IRR    Unit division 00    Status of  Unit ISSUED,FINAL    Transfusion Status OK TO TRANSFUSE    Crossmatch Result      Compatible Performed at [place] [address], [place], [place] [address]    Unit Number [id number]    Blood Component Type RBC, LR IRR    Unit division 00    Status of Unit ISSUED    Transfusion Status OK TO TRANSFUSE    Crossmatch Result Compatible   Prepare RBC     Status: None   Collection Time: 06/08/18  5:00 PM  Result Value Ref Range   Order Confirmation      ORDER PROCESSED BY BLOOD BANK Performed at [facility], [place] [address] [place], [place] [address]   Urinalysis, Routine w reflex microscopic     Status: Abnormal   Collection Time: 06/08/18  9:44 PM  Result Value Ref Range   Color, Urine YELLOW YELLOW   APPearance HAZY (A) CLEAR   Specific Gravity, Urine 1.009 1.005 - 1.030   pH 5.0 5.0 - 8.0   Glucose, UA NEGATIVE NEGATIVE mg/dL   Hgb urine dipstick NEGATIVE NEGATIVE   Bilirubin Urine NEGATIVE NEGATIVE   Ketones, ur NEGATIVE NEGATIVE mg/dL   Protein, ur NEGATIVE NEGATIVE mg/dL   Nitrite NEGATIVE NEGATIVE   Leukocytes, UA TRACE (A) NEGATIVE   RBC / HPF 0-5 0 - 5 RBC/hpf   WBC, UA 6-10 0 - 5 WBC/hpf   Bacteria, UA RARE (A) NONE SEEN   Squamous Epithelial / LPF 6-10 0 - 5    Comment: Performed at [facility], [place] [address] [place], [place] [address]  POC occult blood, ED     Status: None   Collection Time: 06/08/18  9:48 PM  Result Value Ref Range   Fecal Occult Bld NEGATIVE NEGATIVE  Vitamin B12     Status: None   Collection Time: 06/08/18 11:15 PM  Result Value Ref Range   Vitamin B-12 853 180 - 914 pg/mL    Comment: (NOTE) This assay is not validated for testing neonatal or myeloproliferative syndrome specimens for Vitamin B12 levels. Performed at [facility], [place] [address] [place], [place]  [address]   Folate     Status: None   Collection Time: 06/08/18 11:15 PM  Result Value Ref Range   Folate 9.1 >5.9 ng/mL    Comment: Performed at [facility], [place] [address] [place], [place] [address]  Iron and TIBC     Status: Abnormal   Collection Time: 06/08/18 11:15 PM  Result Value Ref Range   Iron 147 28 - 170 ug/dL   TIBC 180 (L) 250 - 450 ug/dL   Saturation Ratios 82 (H) 10.4 - 31.8 %   UIBC 33 ug/dL    Comment: Performed at [facility], [place] [address] [place], [place] [address]  Ferritin     Status: Abnormal   Collection Time: 06/08/18 11:15 PM  Result Value Ref Range   Ferritin 5,614 (H) 11 - 307 ng/mL    Comment: Performed at [facility], [place] [address], [place], [place] [address]  Reticulocytes     Status: Abnormal   Collection Time: 06/08/18 11:15 PM  Result Value Ref Range   Retic Ct Pct 1.4 0.4 - 3.1 %   RBC. 2.23 (L) 3.87 - 5.11 MIL/uL   Retic Count, Absolute 31.7 19.0 - 186.0 K/uL   Immature Retic Fract 6.7 2.3 - 15.9 %    Comment:  Performed at [facility], [place] [address] [place], [place] [address]  Prepare RBC     Status: None   Collection Time: 06/09/18  4:30 AM  Result Value Ref Range   Order Confirmation      ORDER PROCESSED BY BLOOD BANK Performed at [place] [address] [place], [place] [address]   Prealbumin     Status: Abnormal   Collection Time: 06/09/18  7:04 AM  Result Value Ref Range   Prealbumin 15.4 (L) 18 - 38 mg/dL    Comment: Performed at [facility], [place] [address] [place], [place] [address]  Magnesium     Status: Abnormal   Collection Time: 06/09/18  7:04 AM  Result Value Ref Range   Magnesium 1.4 (L) 1.7 - 2.4 mg/dL    Comment: Performed at [facility], [place] [address] [place], [place] [address]  Phosphorus     Status: Abnormal   Collection Time: 06/09/18  7:04 AM  Result Value Ref Range    Phosphorus 4.9 (H) 2.5 - 4.6 mg/dL    Comment: Performed at [facility], [place] [address], [place], [place] [address]  Comprehensive metabolic panel     Status: Abnormal   Collection Time: 06/09/18  7:04 AM  Result Value Ref Range   Sodium 136 135 - 145 mmol/L   Potassium 4.3 3.5 - 5.1 mmol/L   Chloride 105 98 - 111 mmol/L   CO2 22 22 - 32 mmol/L   Glucose, Bld 100 (H) 70 - 99 mg/dL   BUN 58 (H) 8 - 23 mg/dL   Creatinine, Ser 1.58 (H) 0.44 - 1.00 mg/dL   Calcium 9.2 8.9 - 10.3 mg/dL   Total Protein 7.0 6.5 - 8.1 g/dL   Albumin 2.9 (L) 3.5 - 5.0 g/dL   AST 43 (H) 15 - 41 U/L   ALT 30 0 - 44 U/L   Alkaline Phosphatase 110 38 - 126 U/L   Total Bilirubin 2.1 (H) 0.3 - 1.2 mg/dL   GFR calc non Af Amer 34 (L) >60 mL/min   GFR calc Af Amer 40 (L) >60 mL/min   Anion gap 9 5 - 15    Comment: Performed at [facility], [place] [address] [place], [place] [address]  CBC     Status: Abnormal   Collection Time: 06/09/18  7:04 AM  Result Value Ref Range   WBC 9.5 4.0 - 10.5 K/uL   RBC 2.49 (L) 3.87 - 5.11 MIL/uL   Hemoglobin 7.7 (L) 12.0 - 15.0 g/dL   HCT 23.4 (L) 36.0 - 46.0 %   MCV 94.0 80.0 - 100.0 fL   MCH 30.9 26.0 - 34.0 pg   MCHC 32.9 30.0 - 36.0 g/dL   RDW 16.6 (H) 11.5 - 15.5 %   Platelets 289 150 - 400 K/uL   nRBC 0.0 0.0 - 0.2 %    Comment: Performed at [facility], [place] [address] [place], [place] [address]  CBG monitoring, ED     Status: None   Collection Time: 06/09/18  7:21 AM  Result Value Ref Range   Glucose-Capillary 92 70 - 99 mg/dL  CBG monitoring, ED     Status: None   Collection Time: 06/09/18  8:05 AM  Result Value Ref Range   Glucose-Capillary 93 70 - 99 mg/dL   Ct Pelvis Wo Contrast  Result Date: 06/08/2018 CLINICAL DATA:  Initial evaluation for sacral decubitus ulcer.  EXAM: CT PELVIS WITHOUT CONTRAST TECHNIQUE: Multidetector CT imaging of the pelvis was performed following the standard protocol  without intravenous contrast. COMPARISON:  Prior CT from 05/08/2017. FINDINGS: Urinary Tract: Punctate nonobstructive calculi noted within the partially visualized lower pole the right kidney. Visualized kidneys otherwise unremarkable. Visualized ureters of normal caliber without abnormality. Bladder moderately distended without acute abnormality. Bowel: Anastomotic suture present at the right colon. Visualized bowels of normal caliber without evidence for obstruction. Moderate to large volume retained stool within the distal colon and rectal vault, suggesting constipation. No acute inflammatory changes about the visualized bowels. Mild colonic diverticulosis without evidence for acute diverticulitis. Vascular/Lymphatic: Extensive aorto bi-iliac atherosclerotic disease with extensive vascular calcifications throughout the pelvic vasculature. No adenopathy. Reproductive: Multiple calcifications within the uterus likely reflect small calcified fibroids. Uterus and ovaries otherwise within normal limits. Other: No free air or fluid. Small irregular fat containing paraumbilical hernia with associated suture. Musculoskeletal: Mild diffuse anasarca. Visualized musculature of the pelvis and upper thighs atrophic in appearance. Sacral decubitus ulcer seen at the lower midline (series 10, image 119). Ulceration extends to the level of the underlying coccyx. No osseous erosion or periosteal reaction to suggest osteomyelitis within the underlying coccyx. No loculated collections identified on this noncontrast examination. No abnormal soft tissue emphysema. No acute osseous abnormality. No discrete lytic or blastic osseous lesions. Degenerative changes noted within the visualized lower lumbar spine. IMPRESSION: 1. Sacral decubitus ulcer at the mid posterior pelvis, extending to the level of the underlying coccyx. No evidence for associated osteomyelitis by CT. No discrete or drainable collections identified. 2. Moderate to  large volume stool within the visualized colon, consistent with constipation. 3. Nonobstructive right renal nephrolithiasis. 4. Extensive atherosclerosis. Electronically Signed   By: Jeannine Boga M.D.   On: 06/08/2018 18:17   Anti-infectives (From admission, onward)   Start     Dose/Rate Route Frequency Ordered Stop   06/09/18 1000  metroNIDAZOLE (FLAGYL) tablet 250 mg     250 mg Oral Daily 06/09/18 0401     06/08/18 1745  clindamycin (CLEOCIN) IVPB 600 mg  Status:  Discontinued     600 mg 100 mL/hr over 30 Minutes Intravenous  Once 06/08/18 1731 06/08/18 1732   06/08/18 1745  vancomycin (VANCOCIN) 1,500 mg in sodium chloride 0.9 % 500 mL IVPB     1,500 mg 250 mL/hr over 120 Minutes Intravenous  Once 06/08/18 1740 06/08/18 2030        Assessment/Plan Severe pulmonary hypertension AS H/o colon cancers/p right hemicolectomy 2008 Dr. Hulen Skains DM2 HTN Myelodysplastic syndrome with transfusion dependent anemia (requires blood transfusion about every 3 weeks)  Sees Dr. Zola Button Symptomatic anemia - receiving 2 units PRBCs now  Sacral wound  - developed during recent hospitalization 05/11/18 through 05/26/18  - no active bleeding at this time, will order silver nitrate sticks to have at bedside if bleeding recurs  - continue Santyl and wet to dry dressing changes  - discussed obtaining MRI pelvis with primary team to evaluate for possible osteomyelitis  ID - clinda/vancomycin 11/25, flagyl 11/26>> VTE - SCDs, no chemical DVT prophylaxis due to bleeding FEN - CM diet Foley - none  Wellington [place], [facility] Surgery 06/09/2018, 8:33 AM Pager: [phone]  Agree with above.  Alphonsa Overall, MD, [facility] Surgery Pager: [phone] Office phone:  [phone]

## 2018-06-09 NOTE — ED Notes (Signed)
ED TO INPATIENT HANDOFF REPORT  Name/Age/Gender Patricia Mata 62 y.o. female  Code Status Code Status History    Date Active Date Inactive Code Status Order ID Comments User Context   05/23/2018 1253 05/26/2018 2355 DNR [id number]  Irean Hong, NP Inpatient   05/11/2018 1109 05/23/2018 1253 Full Code [id number]  Marijean Heath, NP ED   06/14/2017 1549 06/16/2017 1645 Full Code [id number]  Lady Deutscher, MD Inpatient    Questions for Most Recent Historical Code Status (Order [id number])    Question Answer Comment   In the event of cardiac or respiratory ARREST Do not call a "code blue"    In the event of cardiac or respiratory ARREST Do not perform Intubation, CPR, defibrillation or ACLS    In the event of cardiac or respiratory ARREST Use medication by any route, position, wound care, and other measures to relive pain and suffering. May use oxygen, suction and manual treatment of airway obstruction as needed for comfort.         Advance Directive Documentation     Most Recent Value  Type of Advance Directive  Out of facility DNR (pink MOST or yellow form)  Pre-existing out of facility DNR order (yellow form or pink MOST form)  Yellow form placed in chart (order not valid for inpatient use)  "MOST" Form in Place?  -      Home/SNF/Other   Chief Complaint Sacral Wound  Level of Care/Admitting Diagnosis ED Disposition    ED Disposition Condition [place]: [place] [[id number]]  Level of Care: Med-Surg [16]  Diagnosis: Sacral wound [034917]  Admitting Physician: Toy Baker [[id number]]  Attending Physician: Toy Baker [[id number]]  Estimated length of stay: 3 - 4 days  Certification:: I certify this patient will need inpatient services for at least 2 midnights  PT Class (Do Not Modify): Inpatient [101]  PT Acc Code (Do Not Modify): Private [1]       Medical History Past Medical History:  Diagnosis Date  . Cancer  [facility]) 2008   Colon   . Diabetes mellitus without complication ([place])   . Hypertension   . Macrocytic anemia     Allergies Allergies  Allergen Reactions  . Ancef [Cefazolin] Itching    Severe itching- after procedure, ancef was the antibiotic.-04/02/17  . Sulfa Antibiotics Rash and Other (See Comments)    Blisters, also    IV Location/Drains/Wounds Patient Lines/Drains/Airways Status   Active Line/Drains/Airways    Name:   Placement date:   Placement time:   Site:   Days:   Peripheral IV 06/08/18 Left Antecubital   06/08/18    1712    Antecubital   1   Peripheral IV 06/08/18 Right Antecubital   06/08/18    2321    Antecubital   1   Pressure Injury 05/22/18 Unstageable - Full thickness tissue loss in which the base of the ulcer is covered by slough (yellow, tan, gray, green or brown) and/or eschar (tan, brown or black) in the wound bed.   05/22/18    0800     18   Wound / Incision (Open or Dehisced) 05/15/18 Other (Comment) Labia Right;Distal eraser sized red wound bed   05/15/18    2000    Labia   25   Wound / Incision (Open or Dehisced) 05/18/18 Other (Comment) Sacrum Right;Left exorciation on sacrum; red, open wounds;  Full thickness skin loss related to MASD, NOT a pressure injury  05/18/18    0800    Sacrum   22          Labs/Imaging Results for orders placed or performed during the hospital encounter of 06/08/18 (from the past 48 hour(s))  CBC with Differential     Status: Abnormal   Collection Time: 06/08/18  3:26 PM  Result Value Ref Range   WBC 12.0 (H) 4.0 - 10.5 K/uL   RBC 2.29 (L) 3.87 - 5.11 MIL/uL   Hemoglobin 6.9 (LL) 12.0 - 15.0 g/dL    Comment: This critical result has verified and been called to B.JESSE by Rosezella Rumpf on 11 25 2019 at 1657, and has been read back. CRITICAL RESULT VERIFIED   HCT 21.7 (L) 36.0 - 46.0 %   MCV 94.8 80.0 - 100.0 fL   MCH 30.1 26.0 - 34.0 pg   MCHC 31.8 30.0 - 36.0 g/dL   RDW 17.7 (H) 11.5 - 15.5 %   Platelets 339 150 - 400  K/uL   nRBC 0.0 0.0 - 0.2 %   Neutrophils Relative % 77 %   Neutro Abs 9.3 (H) 1.7 - 7.7 K/uL   Lymphocytes Relative 8 %   Lymphs Abs 0.9 0.7 - 4.0 K/uL   Monocytes Relative 14 %   Monocytes Absolute 1.7 (H) 0.1 - 1.0 K/uL   Eosinophils Relative 0 %   Eosinophils Absolute 0.1 0.0 - 0.5 K/uL   Basophils Relative 0 %   Basophils Absolute 0.0 0.0 - 0.1 K/uL   Immature Granulocytes 1 %   Abs Immature Granulocytes 0.07 0.00 - 0.07 K/uL    Comment: Performed at [facility], [place] [address] [place], [place] [address]  Basic metabolic panel     Status: Abnormal   Collection Time: 06/08/18  3:26 PM  Result Value Ref Range   Sodium 132 (L) 135 - 145 mmol/L   Potassium 4.7 3.5 - 5.1 mmol/L   Chloride 101 98 - 111 mmol/L   CO2 21 (L) 22 - 32 mmol/L   Glucose, Bld 151 (H) 70 - 99 mg/dL   BUN 60 (H) 8 - 23 mg/dL   Creatinine, Ser 2.15 (H) 0.44 - 1.00 mg/dL   Calcium 9.1 8.9 - 10.3 mg/dL   GFR calc non Af Amer 23 (L) >60 mL/min   GFR calc Af Amer 27 (L) >60 mL/min    Comment: (NOTE) The eGFR has been calculated using the CKD EPI equation. This calculation has not been validated in all clinical situations. eGFR's persistently <60 mL/min signify possible Chronic Kidney Disease.    Anion gap 10 5 - 15    Comment: Performed at [facility] [place] [address] [place], [place] [address]  Type and screen [place]     Status: None (Preliminary result)   Collection Time: 06/08/18  5:00 PM  Result Value Ref Range   ABO/RH(D) A NEG    Antibody Screen NEG    Sample Expiration 06/11/2018    Unit Number [id number]    Blood Component Type RBC, LR IRR    Unit division 00    Status of Unit ISSUED    Transfusion Status OK TO TRANSFUSE    Crossmatch Result      Compatible Performed at [facility], [place] [address], [place], [place] [address]    Unit Number [id number]    Blood Component Type RBC, LR IRR    Unit  division 00    Status of Unit ALLOCATED  Transfusion Status OK TO TRANSFUSE    Crossmatch Result Compatible   Prepare RBC     Status: None   Collection Time: 06/08/18  5:00 PM  Result Value Ref Range   Order Confirmation      ORDER PROCESSED BY BLOOD BANK Performed at [facility], [place] [address], [place], [place] [address]   Urinalysis, Routine w reflex microscopic     Status: Abnormal   Collection Time: 06/08/18  9:44 PM  Result Value Ref Range   Color, Urine YELLOW YELLOW   APPearance HAZY (A) CLEAR   Specific Gravity, Urine 1.009 1.005 - 1.030   pH 5.0 5.0 - 8.0   Glucose, UA NEGATIVE NEGATIVE mg/dL   Hgb urine dipstick NEGATIVE NEGATIVE   Bilirubin Urine NEGATIVE NEGATIVE   Ketones, ur NEGATIVE NEGATIVE mg/dL   Protein, ur NEGATIVE NEGATIVE mg/dL   Nitrite NEGATIVE NEGATIVE   Leukocytes, UA TRACE (A) NEGATIVE   RBC / HPF 0-5 0 - 5 RBC/hpf   WBC, UA 6-10 0 - 5 WBC/hpf   Bacteria, UA RARE (A) NONE SEEN   Squamous Epithelial / LPF 6-10 0 - 5    Comment: Performed at [facility], [place] [address] [place], [place] [address]  POC occult blood, ED     Status: None   Collection Time: 06/08/18  9:48 PM  Result Value Ref Range   Fecal Occult Bld NEGATIVE NEGATIVE  Folate     Status: None   Collection Time: 06/08/18 11:15 PM  Result Value Ref Range   Folate 9.1 >5.9 ng/mL    Comment: Performed at [facility], [place] [address], [place], [place] [address]  Reticulocytes     Status: Abnormal   Collection Time: 06/08/18 11:15 PM  Result Value Ref Range   Retic Ct Pct 1.4 0.4 - 3.1 %   RBC. 2.23 (L) 3.87 - 5.11 MIL/uL   Retic Count, Absolute 31.7 19.0 - 186.0 K/uL   Immature Retic Fract 6.7 2.3 - 15.9 %    Comment: Performed at [facility], [place] [address], [place],  [address]   Ct Pelvis Wo Contrast  Result Date: 06/08/2018 CLINICAL DATA:  Initial evaluation for sacral decubitus ulcer. EXAM:  CT PELVIS WITHOUT CONTRAST TECHNIQUE: Multidetector CT imaging of the pelvis was performed following the standard protocol without intravenous contrast. COMPARISON:  Prior CT from 05/08/2017. FINDINGS: Urinary Tract: Punctate nonobstructive calculi noted within the partially visualized lower pole the right kidney. Visualized kidneys otherwise unremarkable. Visualized ureters of normal caliber without abnormality. Bladder moderately distended without acute abnormality. Bowel: Anastomotic suture present at the right colon. Visualized bowels of normal caliber without evidence for obstruction. Moderate to large volume retained stool within the distal colon and rectal vault, suggesting constipation. No acute inflammatory changes about the visualized bowels. Mild colonic diverticulosis without evidence for acute diverticulitis. Vascular/Lymphatic: Extensive aorto bi-iliac atherosclerotic disease with extensive vascular calcifications throughout the pelvic vasculature. No adenopathy. Reproductive: Multiple calcifications within the uterus likely reflect small calcified fibroids. Uterus and ovaries otherwise within normal limits. Other: No free air or fluid. Small irregular fat containing paraumbilical hernia with associated suture. Musculoskeletal: Mild diffuse anasarca. Visualized musculature of the pelvis and upper thighs atrophic in appearance. Sacral decubitus ulcer seen at the lower midline (series 10, image 119). Ulceration extends to the level of the underlying coccyx. No osseous erosion or periosteal reaction to suggest osteomyelitis within the underlying coccyx. No loculated collections identified on this noncontrast examination. No  abnormal soft tissue emphysema. No acute osseous abnormality. No discrete lytic or blastic osseous lesions. Degenerative changes noted within the visualized lower lumbar spine. IMPRESSION: 1. Sacral decubitus ulcer at the mid posterior pelvis, extending to the level of the underlying  coccyx. No evidence for associated osteomyelitis by CT. No discrete or drainable collections identified. 2. Moderate to large volume stool within the visualized colon, consistent with constipation. 3. Nonobstructive right renal nephrolithiasis. 4. Extensive atherosclerosis. Electronically Signed   By: Jeannine Boga M.D.   On: 06/08/2018 18:17    Pending Labs Unresulted Labs (From admission, onward)    Start     Ordered   06/08/18 2019  Vitamin B12  (Anemia Panel (PNL))  Once,   R     06/08/18 2018   06/08/18 2019  Iron and TIBC  (Anemia Panel (PNL))  Once,   R     06/08/18 2018   06/08/18 2019  Ferritin  (Anemia Panel (PNL))  Once,   R     06/08/18 2018   06/08/18 1947  Occult blood card to lab, stool RN will collect  Once,   STAT    Question:  Specimen to be collected by?  Answer:  RN will collect   06/08/18 1946   Signed and Held  Hemoglobin A1c  Once,   R    Comments:  To assess prior glycemic control    Signed and Held   Signed and Held  Magnesium  Tomorrow morning,   R    Comments:  Call MD if <1.5    Signed and Held   Signed and Held  Phosphorus  Tomorrow morning,   R     Signed and Held   Signed and Held  TSH  Once,   R    Comments:  Cancel if already done within 1 month and notify MD    Signed and Held   Signed and Held  Comprehensive metabolic panel  Once,   R    Comments:  Cal MD for K<3.5 or >5.0    Signed and Held   Signed and Held  CBC  Once,   R    Comments:  Call for hg <8.0    Signed and Held   Signed and Held  Prepare RBC  (Adult Blood Administration - Red Blood Cells)  Once,   R    Question Answer Comment  # of Units 1 unit   Transfusion Indications Symptomatic Anemia   If emergent release call blood bank Not emergent release      Signed and Held   Signed and Held  Prealbumin  Tomorrow morning,   R     Signed and Held          Vitals/Pain Today's Vitals   06/08/18 2039 06/08/18 2130 06/08/18 2304 06/08/18 2325  BP:  (!) 167/52 (!) 161/44  (!) 154/45  Pulse:  66 60 (!) 58  Resp:  _0 Temp:  99.1 F (37.3 C) 98.2 F (36.8 C) 98 F (36.7 C)  TempSrc:  Rectal Oral Oral  SpO2:  100% 100% 100%  Weight:      Height:      PainSc: 4        Isolation Precautions No active isolations  Medications Medications  morphine 4 MG/ML injection 4 mg (4 mg Intravenous Given 06/08/18 1713)  sodium chloride 0.9 % bolus 500 mL (0 mLs Intravenous Stopped 06/08/18 1827)  0.9 %  sodium chloride infusion (0 mL/hr Intravenous  Stopped 06/08/18 2041)  vancomycin (VANCOCIN) 1,500 mg in sodium chloride 0.9 % 500 mL IVPB (0 mg Intravenous Stopped 06/08/18 2030)    Mobility walks with person assist

## 2018-06-09 NOTE — ED Notes (Signed)
Wound dressing was saturated with drainage and urine as purewick was not working properly. Completed a wet to dry dressing with sterile saline and sterile gauze. Placed an ABD pad over sterile cause and secured with paper tape. Per ED note, wound care to see patient today.

## 2018-06-09 NOTE — ED Notes (Signed)
Called RN on 2W to give report but they were unable to take the call; was told that they would call back in the next 10-15 minutes.

## 2018-06-09 NOTE — Progress Notes (Signed)
PROGRESS NOTE    Patricia Mata  KNL:[id number] DOB: Nov 22, 1955 DOA: 06/08/2018 PCP: Glendale Chard, MD  Brief Narrative:  Patricia Mata is Patricia Mata 62 y.o. female with medical history significant of severe pulmonary hypertension, colon cancer(in remission, no active tx), diabetes, hypertension, anemia, myelodysplastic syndrome, severe AS who presented with  Sacral wound developed after her ICU stay when she was admitted from 10/20 to 11/12.  She was discharged to rehab facility where she has been having blood clots found in the dressings no purulent discharge, no foul smell,  noted no chills or fevers. At the time of discharge from her last hospitalization, wound care had seen her and recommended hydrotherapy.  She requires transfusions every 2-3 weeks as outpatient.   Wound care specialist came to see her at facility Dr. Renard Hamper tried to cauterize it 4 days ago but the bleeding did not stop.   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  - S/p 2 units pRBC - follow H/H - transfuse for <7  Sacral wound: this wound developed during hospitalization from 10/28 - 11/12.  Appreciate surgery's assistance, recommending MRI pelvis to look for osteomyelitis.  No active bleeding at this point in time. - santyl and wet to dry dressings per surgery.  Silver nitrate if bleeding recurs.  - wound care, appreciate recs - follow MRI   Osteomyelitis: early coccygeal osteo noted on MRI.  Will hold off on  broadening abx at this point given stability.  She's currently on flagyl at 250 mg daily, but I'm not sure what the indication is for this? - discuss with ID on 11/27   Hypothyroidism: TSH in 60's, low free t4.  Follow free T3.  Will start synthroid at lower dose given cardiac issues (25 mcg).  MDS (myelodysplastic syndrome) (HCC) follows with Dr. Alen Blew.  Transfusion dependent.  He's mentioned having hospice involved in the future and she'd like to defer this as long as possible.    Essential hypertension: continue metoprolol (transitioned from toprol xl as an outpatient), hydralazine.  Holding lasix and spironolactone.  Hyperlipidemia atorvastatin  Severe Pulm Hypertension: continue sildenafil, currently holding lasix, follow closely.    Moderate AS/MS: not candidate for intervention given PAH  Atrial Fibrillation: not on anticoagulation given above.  Amiodarone 200 mg BID.  Acute renal failure: improved this morning with transfusion.  Holding lasix/spironolactone as noted above.  Continue to monitor.   Hyponatremia mild, follow   Constipation will treat with bowel regimen  T2DM-  - SSI, lantus 14 units    - check TSH and HgA1C - Hold by mouth medications   Poor p.o. intake order nutritional consult  Hypomagnesemia: replace, follow   DVT prophylaxis: SCD Code Status: DNR Family Communication: none at bedside Disposition Plan: pending   Consultants:   surgery  Procedures:   none   Antimicrobials:  Anti-infectives (From admission, onward)   Start     Dose/Rate Route Frequency Ordered Stop   06/09/18 1000  metroNIDAZOLE (FLAGYL) tablet 250  mg     250 mg Oral Daily 06/09/18 0401     06/08/18 1745  clindamycin (CLEOCIN) IVPB 600 mg  Status:  Discontinued     600 mg 100 mL/hr over 30 Minutes Intravenous  Once 06/08/18 1731 06/08/18 1732   06/08/18 1745  vancomycin (VANCOCIN) 1,500 mg in sodium chloride 0.9 % 500 mL IVPB     1,500 mg 250 mL/hr over 120 Minutes  Intravenous  Once 06/08/18 1740 06/08/18 2030     Subjective: Feeling ok.  Feels better after transfusion.  Objective: Vitals:   06/09/18 1300 06/09/18 1330 06/09/18 1535 06/09/18 1808  BP: (!) 160/44 (!) 161/52 (!) 156/47 (!) 157/44  Pulse: (!) 58 (!) 58 61 (!) 59  Resp: 10 13 16 18   Temp:    99 F (37.2 C)  TempSrc:    Oral  SpO2: 98% 100% 100% 100%  Weight:      Height:        Intake/Output Summary (Last 24 hours) at 06/09/2018 1843 Last data filed at 06/09/2018 1812 Gross per 24 hour  Intake 2371 ml  Output 250 ml  Net 2121 ml   Filed Weights   06/08/18 1402  Weight: 84.8 kg    Examination:  General exam: Appears calm and comfortable  Respiratory system: Clear to auscultation. Respiratory effort normal. Cardiovascular system: S1 & S2 heard, RRR Gastrointestinal system: Abdomen is nondistended, soft and nontender Central nervous system: Alert and oriented. Moving all extremities. Skin: stage 4 decub, not actively bleeding at this time Psychiatry: Judgement and insight appear normal. Mood & affect appropriate.     Data Reviewed: I have personally reviewed following labs and imaging studies  CBC: Recent Labs  Lab 06/08/18 1526 06/09/18 0704  WBC 12.0* 9.5  NEUTROABS 9.3*  --   HGB 6.9* 7.7*  HCT 21.7* 23.4*  MCV 94.8 94.0  PLT 339 656   Basic Metabolic Panel: Recent Labs  Lab 06/08/18 1526 06/09/18 0704  NA 132* 136  K 4.7 4.3  CL 101 105  CO2 21* 22  GLUCOSE 151* 100*  BUN 60* 58*  CREATININE 2.15* 1.58*  CALCIUM 9.1 9.2  MG  --  1.4*  PHOS  --  4.9*   GFR: Estimated Creatinine Clearance: 38.4 mL/min (Lyris Hitchman) (by C-G formula based on SCr of 1.58 mg/dL (H)). Liver Function Tests: Recent Labs  Lab 06/09/18 0704  AST 43*  ALT 30  ALKPHOS 110  BILITOT 2.1*  PROT 7.0  ALBUMIN 2.9*   No results for input(s): LIPASE, AMYLASE in the last 168 hours. No results for input(s): AMMONIA in the last 168 hours. Coagulation Profile: No results for  input(s): INR, PROTIME in the last 168 hours. Cardiac Enzymes: No results for input(s): CKTOTAL, CKMB, CKMBINDEX, TROPONINI in the last 168 hours. BNP (last 3 results) No results for input(s): PROBNP in the last 8760 hours. HbA1C: Recent Labs    06/09/18 0704  HGBA1C 6.3*   CBG: Recent Labs  Lab 06/09/18 0721 06/09/18 0805 06/09/18 1631  GLUCAP 92 93 93   Lipid Profile: No results for input(s): CHOL, HDL, LDLCALC, TRIG, CHOLHDL, LDLDIRECT in the last 72 hours. Thyroid Function Tests: Recent Labs    06/09/18 0704  TSH 61.191*  FREET4 0.54*   Anemia Panel: Recent Labs    06/08/18 2315  VITAMINB12 853  FOLATE 9.1  FERRITIN 5,614*  TIBC 180*  IRON 147  RETICCTPCT 1.4   Sepsis Labs: No results for input(s): PROCALCITON, LATICACIDVEN in the last 168  hours.  No results found for this or any previous visit (from the past 240 hour(s)).       Radiology Studies: Ct Pelvis Wo Contrast  Result Date: 06/08/2018 CLINICAL DATA:  Initial evaluation for sacral decubitus ulcer. EXAM: CT PELVIS WITHOUT CONTRAST TECHNIQUE: Multidetector CT imaging of the pelvis was performed following the standard protocol without intravenous contrast. COMPARISON:  Prior CT from 05/08/2017. FINDINGS: Urinary Tract: Punctate nonobstructive calculi noted within the partially visualized lower pole the right kidney. Visualized kidneys otherwise unremarkable. Visualized ureters of normal caliber without abnormality. Bladder moderately distended without acute abnormality. Bowel: Anastomotic suture present at the right colon. Visualized bowels of normal caliber without evidence for obstruction. Moderate to large volume retained stool within the distal colon and rectal vault, suggesting constipation. No acute inflammatory changes about the visualized bowels. Mild colonic diverticulosis without evidence for acute diverticulitis. Vascular/Lymphatic: Extensive aorto bi-iliac atherosclerotic disease with extensive  vascular calcifications throughout the pelvic vasculature. No adenopathy. Reproductive: Multiple calcifications within the uterus likely reflect small calcified fibroids. Uterus and ovaries otherwise within normal limits. Other: No free air or fluid. Small irregular fat containing paraumbilical hernia with associated suture. Musculoskeletal: Mild diffuse anasarca. Visualized musculature of the pelvis and upper thighs atrophic in appearance. Sacral decubitus ulcer seen at the lower midline (series 10, image 119). Ulceration extends to the level of the underlying coccyx. No osseous erosion or periosteal reaction to suggest osteomyelitis within the underlying coccyx. No loculated collections identified on this noncontrast examination. No abnormal soft tissue emphysema. No acute osseous abnormality. No discrete lytic or blastic osseous lesions. Degenerative changes noted within the visualized lower lumbar spine. IMPRESSION: 1. Sacral decubitus ulcer at the mid posterior pelvis, extending to the level of the underlying coccyx. No evidence for associated osteomyelitis by CT. No discrete or drainable collections identified. 2. Moderate to large volume stool within the visualized colon, consistent with constipation. 3. Nonobstructive right renal nephrolithiasis. 4. Extensive atherosclerosis. Electronically Signed   By: Jeannine Boga M.D.   On: 06/08/2018 18:17   Mr Pelvis W Wo Contrast  Result Date: 06/09/2018 CLINICAL DATA:  Sacral decubitus ulcer.  Evaluate for osteomyelitis. EXAM: MRI PELVIS WITHOUT AND WITH CONTRAST TECHNIQUE: Multiplanar multisequence MR imaging of the pelvis was performed both before and after administration of intravenous contrast. CONTRAST:  8 mL Gadavist intravenous contrast. COMPARISON:  CT pelvis from yesterday. FINDINGS: Urinary Tract:  No abnormality visualized. Bowel:  Unremarkable visualized pelvic bowel loops. Vascular/Lymphatic: No pathologically enlarged lymph nodes. No  significant vascular abnormality seen. Reproductive:  Small uterine fibroids again noted.  No adnexal mass. Other:  None. Musculoskeletal: Midline sacral decubitus ulcer extending to the coccyx. Marrow edema within the coccyx with graying of the posterior cortex and faintly decreased T1 marrow signal. No abscess. Mild presacral soft tissue edema. Mild edema within the bilateral gluteus maximus muscles adjacent to the sacrum, likely reactive. No fluid collection. IMPRESSION: 1. Midline sacral decubitus ulcer extending to the coccyx with evidence of early coccygeal osteomyelitis. No abscess. Electronically Signed   By: Titus Dubin M.D.   On: 06/09/2018 15:14        Scheduled Meds: . amiodarone  200 mg Oral BID  . atorvastatin  40 mg Oral Daily  . collagenase   Topical Daily  . hydrALAZINE  100 mg Oral TID  . insulin aspart  0-9 Units Subcutaneous Q4H  . insulin glargine  14 Units Subcutaneous QPC breakfast  . latanoprost  1 drop Both Eyes QHS  . metoprolol tartrate  12.5 mg Oral BID  . metroNIDAZOLE  250 mg Oral Daily  . pantoprazole  40 mg Oral Daily  . polyethylene glycol  17 g Oral BID  . sildenafil  20 mg Oral TID   Continuous Infusions:   LOS: 1 day    Time spent: over 30 min    Fayrene Helper, MD [facility] Pager [phone]   If 7PM-7AM, please contact night-coverage [email or web address] Password [facility] 06/09/2018, 6:43 PM

## 2018-06-09 NOTE — Consult Note (Addendum)
[facility] Nurse wound consult note Reason for Consult: Consult requested for sacrum wound.  Pt is familiar to [facility] team from recent admission, refer to progress note on 11/8. Pt states wound had significant bleeding for the past week at her SNF and a physician had to be called in to "aply something" and stop the bleeding. Wound type: Stage 4 pressure injury to sacrum Pressure Injury POA: Yes Measurement: Outer sacrum wound, in gluteal fold is 8X10X.2 cm, 85% beefy red and moist.  In the center the wound is deeper; 5 cm when swab was inserted and bone is palpable. 15% slough, strong odor, mod amt green drainage, no blood noted at this time.  Dressing procedure/placement/frequency: Assessed wound appearance with surgical PA at the bedside.  Pt could benefit from MRI to R/O osteomyelitis. Surgical team ordered Silver Nitrate for staff use if bleeding occurs; refer to their progress notes. Continue present plan of care with Santyl to provide enzymatic debridement of nonviable tissue. Discussed plans and patient verbalized understanding. Please re-consult if further assistance is needed.  Thank-you,  Julien Girt MSN, [place], [place], [place], [place]

## 2018-06-09 NOTE — ED Notes (Signed)
Patient transported to MRI 

## 2018-06-09 NOTE — Progress Notes (Signed)
Report was called to Diego Cory in ICU.  However, when I was reviewing chart noticed admission order level of care changed to Med-Surg at 2200.  Called Anderson Malta, charge RN, in ED and advised her of order.  She confirmed and bed placement also confirmed.  ICU/SD bed assignment removed.  [place] ICU/SD RN IV / Care Coordinator / Rapid Response Nurse Rapid Response Number:  [phone]

## 2018-06-10 DIAGNOSIS — Z9049 Acquired absence of other specified parts of digestive tract: Secondary | ICD-10-CM

## 2018-06-10 DIAGNOSIS — M4628 Osteomyelitis of vertebra, sacral and sacrococcygeal region: Secondary | ICD-10-CM

## 2018-06-10 DIAGNOSIS — I350 Nonrheumatic aortic (valve) stenosis: Secondary | ICD-10-CM

## 2018-06-10 DIAGNOSIS — M869 Osteomyelitis, unspecified: Secondary | ICD-10-CM

## 2018-06-10 DIAGNOSIS — R002 Palpitations: Secondary | ICD-10-CM

## 2018-06-10 DIAGNOSIS — D649 Anemia, unspecified: Secondary | ICD-10-CM

## 2018-06-10 DIAGNOSIS — Z85038 Personal history of other malignant neoplasm of large intestine: Secondary | ICD-10-CM

## 2018-06-10 DIAGNOSIS — Z87891 Personal history of nicotine dependence: Secondary | ICD-10-CM

## 2018-06-10 DIAGNOSIS — L89154 Pressure ulcer of sacral region, stage 4: Secondary | ICD-10-CM

## 2018-06-10 DIAGNOSIS — Z881 Allergy status to other antibiotic agents status: Secondary | ICD-10-CM

## 2018-06-10 DIAGNOSIS — S31000D Unspecified open wound of lower back and pelvis without penetration into retroperitoneum, subsequent encounter: Secondary | ICD-10-CM

## 2018-06-10 DIAGNOSIS — Z9889 Other specified postprocedural states: Secondary | ICD-10-CM

## 2018-06-10 LAB — HEPATIC FUNCTION PANEL
ALT: 32 U/L (ref 0–44)
AST: 45 U/L — ABNORMAL HIGH (ref 15–41)
Albumin: 2.8 g/dL — ABNORMAL LOW (ref 3.5–5.0)
Alkaline Phosphatase: 116 U/L (ref 38–126)
Bilirubin, Direct: 0.9 mg/dL — ABNORMAL HIGH (ref 0.0–0.2)
Indirect Bilirubin: 1.2 mg/dL — ABNORMAL HIGH (ref 0.3–0.9)
Total Bilirubin: 2.1 mg/dL — ABNORMAL HIGH (ref 0.3–1.2)
Total Protein: 6.6 g/dL (ref 6.5–8.1)

## 2018-06-10 LAB — GLUCOSE, CAPILLARY
Glucose-Capillary: 117 mg/dL — ABNORMAL HIGH (ref 70–99)
Glucose-Capillary: 99 mg/dL (ref 70–99)
Glucose-Capillary: 154 mg/dL — ABNORMAL HIGH (ref 70–99)
Glucose-Capillary: 259 mg/dL — ABNORMAL HIGH (ref 70–99)

## 2018-06-10 LAB — BASIC METABOLIC PANEL
Anion gap: 9 (ref 5–15)
BUN: 48 mg/dL — ABNORMAL HIGH (ref 8–23)
CO2: 20 mmol/L — ABNORMAL LOW (ref 22–32)
Calcium: 8.9 mg/dL (ref 8.9–10.3)
Chloride: 105 mmol/L (ref 98–111)
Creatinine, Ser: 1.3 mg/dL — ABNORMAL HIGH (ref 0.44–1.00)
GFR calc Af Amer: 51 mL/min — ABNORMAL LOW (ref >60)
GFR calc non Af Amer: 44 mL/min — ABNORMAL LOW (ref >60)
Glucose, Bld: 113 mg/dL — ABNORMAL HIGH (ref 70–99)
Potassium: 4 mmol/L (ref 3.5–5.1)
Sodium: 134 mmol/L — ABNORMAL LOW (ref 135–145)

## 2018-06-10 LAB — TYPE AND SCREEN
ABO/RH(D): A NEG
Antibody Screen: NEGATIVE
Unit division: 0
Unit division: 0

## 2018-06-10 LAB — BPAM RBC
Blood Product Expiration Date: 201912202359
Blood Product Expiration Date: 201912202359
ISSUE DATE / TIME: 201911252254
ISSUE DATE / TIME: 201911260655
Unit Type and Rh: 600
Unit Type and Rh: 600

## 2018-06-10 LAB — CBC
HCT: 27.6 % — ABNORMAL LOW (ref 36.0–46.0)
Hemoglobin: 9.1 g/dL — ABNORMAL LOW (ref 12.0–15.0)
MCH: 30.3 pg (ref 26.0–34.0)
MCHC: 33 g/dL (ref 30.0–36.0)
MCV: 92 fL (ref 80.0–100.0)
Platelets: 264 10*3/uL (ref 150–400)
RBC: 3 MIL/uL — ABNORMAL LOW (ref 3.87–5.11)
RDW: 17.1 % — ABNORMAL HIGH (ref 11.5–15.5)
WBC: 9.9 10*3/uL (ref 4.0–10.5)
nRBC: 0 % (ref 0.0–0.2)

## 2018-06-10 LAB — MAGNESIUM: Magnesium: 1.5 mg/dL — ABNORMAL LOW (ref 1.7–2.4)

## 2018-06-10 LAB — T3, FREE: T3, Free: 1.1 pg/mL — ABNORMAL LOW (ref 2.0–4.4)

## 2018-06-10 MED ORDER — JUVEN PO PACK
1.0000 | PACK | Freq: Two times a day (BID) | ORAL | Status: DC
  Administered 2018-06-10 – 2018-06-15 (×9): 1 via ORAL
  Filled 2018-06-10 (×11): qty 1

## 2018-06-10 MED ORDER — TRAMADOL HCL 50 MG PO TABS: 50.0000 mg | ORAL_TABLET | Freq: Four times a day (QID) | ORAL | Status: DC | PRN

## 2018-06-10 MED ORDER — MAGNESIUM SULFATE 2 GM/50ML IV SOLN
2.0000 g | Freq: Once | INTRAVENOUS | Status: AC
  Administered 2018-06-10: 2 g via INTRAVENOUS
  Filled 2018-06-10: qty 50

## 2018-06-10 MED ORDER — COLLAGENASE 250 UNIT/GM EX OINT
TOPICAL_OINTMENT | Freq: Two times a day (BID) | CUTANEOUS | Status: DC
  Administered 2018-06-11 – 2018-06-15 (×8): via TOPICAL
  Filled 2018-06-10 (×2): qty 90

## 2018-06-10 MED ORDER — GLUCERNA SHAKE PO LIQD
237.0000 mL | Freq: Three times a day (TID) | ORAL | Status: DC
  Administered 2018-06-10 – 2018-06-15 (×13): 237 mL via ORAL
  Filled 2018-06-10 (×17): qty 237

## 2018-06-10 NOTE — Progress Notes (Addendum)
PROGRESS NOTE    Patricia Mata  MWU:[id number] DOB: 17-Jan-1956 DOA: 06/08/2018 PCP: Glendale Chard, MD  Brief Narrative:  Patricia Mata is a 62 y.o. female with medical history significant of severe pulmonary hypertension, colon cancer(in remission, no active tx), diabetes, hypertension, anemia, myelodysplastic syndrome, severe AS who presented with bleeding from her Sacral wound that developed after her ICU stay when she was admitted from 10/20 to 11/12.  She was discharged to rehab facility where she has been having blood clots found in the dressings no purulent discharge, no foul smell, noted no chills or fevers.  She requires transfusion of PRBC every 2 to 3 weeks secondary to MDS.  Patient was hospitalized due to severe anemia and bleeding from her sacral wound.  Assessment & Plan:   Symptomatic anemia Multifactorial related to her hx of MDS (transfusion dependent, requires transfusions every few weeks per past notes) and with possible contribution from bleeding decubitus ulcer.  She was transfused 2 units of PRBC.  Hemoglobin has improved to 9.1 from 6.9.  She has not had any further bleeding from her decubitus wound.  Elevated ferritin likely due to combination of multiple blood transfusions as well as inflammation.  Sacral wound with bleeding This wound developed during hospitalization from 10/28 - 11/12.  General surgery was consulted due to bleeding.  Bleeding has subsided.  General surgery has signed off.  Local wound care.   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 Discussed with Dr. Drucilla Schmidt with infectious disease who recommends consulting plastics to try and get a bone culture.  Discussed with Dr.Dillingham with plastic surgery who will see the patient and consider a bone culture.  Leave her off of broad-spectrum antibiotics for now.  She remains on Flagyl which will be continued.     Hypothyroidism TSH in 60's, low free t4.  T3 is also low.  Patient was started  on low-dose Synthroid due to her recent cardiac issues.    MDS (myelodysplastic syndrome) Follows with Dr. Alen Blew.  Transfusion dependent.  He's mentioned having hospice involved in the future and she'd like to defer this as long as possible.    Essential hypertension Continue metoprolol (transitioned from toprol xl as an outpatient), hydralazine.  Holding lasix and spironolactone.  Hyperlipidemia  Continue statin  Severe Pulm Hypertension Continue sildenafil, currently holding lasix, follow closely.    Moderate AS/MS Not candidate for intervention given [facility]  Atrial Fibrillation Not on anticoagulation given above.  Amiodarone 200 mg BID.  Acute renal failure Function has improved significantly.  Holding her diuretics.  Monitor urine output.  Hyponatremia  Sodium level stable.  Constipation  Bowel regimen.  Hypothyroidism is likely contributing.  Diabetes mellitus type 2 HbA1c 6.3.  Monitor CBGs.  Continue Lantus.  Continue SSI.  Hypomagnesemia Continue to replete.  DVT prophylaxis: SCD Code Status: DNR Family Communication: none at bedside Disposition Plan: pending   Consultants:   Surgery  Plastic surgery  Infectious disease  Procedures:   none   Antimicrobials:  Anti-infectives (From admission, onward)   Start     Dose/Rate Route Frequency Ordered Stop   06/09/18 1000  metroNIDAZOLE (FLAGYL) tablet 250 mg     250 mg Oral Daily 06/09/18 0401     06/08/18 1745  clindamycin (CLEOCIN) IVPB 600 mg  Status:  Discontinued     600 mg 100 mL/hr over 30 Minutes Intravenous  Once 06/08/18 1731 06/08/18 1732   06/08/18 1745  vancomycin (VANCOCIN) 1,500 mg in sodium chloride 0.9 % 500 mL  IVPB     1,500 mg 250 mL/hr over 120 Minutes Intravenous  Once 06/08/18 1740 06/08/18 2030     Subjective: Patient denies any complaints at this time.  No significant pain in the back.  Asking when she can be discharged.  Objective: Vitals:   06/09/18 1808 06/09/18  2130 06/10/18 0215 06/10/18 0445  BP: (!) 157/44 (!) 147/72 (!) 120/50 (!) 150/51  Pulse: (!) 59 66 66 60  Resp: 18 16 16 14   Temp: 99 F (37.2 C) 98.5 F (36.9 C) 98.6 F (37 C) 98.1 F (36.7 C)  TempSrc: Oral Oral Oral Oral  SpO2: 100% 100% 100% 100%  Weight:      Height:        Intake/Output Summary (Last 24 hours) at 06/10/2018 1304 Last data filed at 06/10/2018 1008 Gross per 24 hour  Intake 600 ml  Output 1400 ml  Net -800 ml   Filed Weights   06/08/18 1402  Weight: 84.8 kg    Examination:  General exam: Awake alert.  In no distress Respiratory system: Normal effort.  Clear to auscultation bilaterally Cardiovascular system: S1S2 is normal regular.  No S3-S4.  No rubs murmurs or bruit Gastrointestinal system: Abdomen soft.  Nontender nondistended Central nervous system: No obvious focal neurological deficits. Skin: Stage IV sacral decub.  No active bleeding.  Good granulation tissue.    Data Reviewed: I have personally reviewed following labs and imaging studies  CBC: Recent Labs  Lab 06/08/18 1526 06/09/18 0704 06/10/18 0456  WBC 12.0* 9.5 9.9  NEUTROABS 9.3*  --   --   HGB 6.9* 7.7* 9.1*  HCT 21.7* 23.4* 27.6*  MCV 94.8 94.0 92.0  PLT 339 289 875   Basic Metabolic Panel: Recent Labs  Lab 06/08/18 1526 06/09/18 0704 06/10/18 0456  NA 132* 136 134*  K 4.7 4.3 4.0  CL 101 105 105  CO2 21* 22 20*  GLUCOSE 151* 100* 113*  BUN 60* 58* 48*  CREATININE 2.15* 1.58* 1.30*  CALCIUM 9.1 9.2 8.9  MG  --  1.4* 1.5*  PHOS  --  4.9*  --    GFR: Estimated Creatinine Clearance: 46.6 mL/min (A) (by C-G formula based on SCr of 1.3 mg/dL (H)). Liver Function Tests: Recent Labs  Lab 06/09/18 0704 06/10/18 0456  AST 43* 45*  ALT 30 32  ALKPHOS 110 116  BILITOT 2.1* 2.1*  PROT 7.0 6.6  ALBUMIN 2.9* 2.8*   HbA1C: Recent Labs    06/09/18 0704  HGBA1C 6.3*   CBG: Recent Labs  Lab 06/09/18 2004 06/09/18 2343 06/10/18 0439 06/10/18 0813  06/10/18 1215  GLUCAP 186* 115* 117* 99 154*   Thyroid Function Tests: Recent Labs    06/09/18 0704  TSH 61.191*  FREET4 0.54*  T3FREE 1.1*   Anemia Panel: Recent Labs    06/08/18 2315  VITAMINB12 853  FOLATE 9.1  FERRITIN 5,614*  TIBC 180*  IRON 147  RETICCTPCT 1.4        Radiology Studies: Ct Pelvis Wo Contrast  Result Date: 06/08/2018 CLINICAL DATA:  Initial evaluation for sacral decubitus ulcer. EXAM: CT PELVIS WITHOUT CONTRAST TECHNIQUE: Multidetector CT imaging of the pelvis was performed following the standard protocol without intravenous contrast. COMPARISON:  Prior CT from 05/08/2017. FINDINGS: Urinary Tract: Punctate nonobstructive calculi noted within the partially visualized lower pole the right kidney. Visualized kidneys otherwise unremarkable. Visualized ureters of normal caliber without abnormality. Bladder moderately distended without acute abnormality. Bowel: Anastomotic suture present at the right  colon. Visualized bowels of normal caliber without evidence for obstruction. Moderate to large volume retained stool within the distal colon and rectal vault, suggesting constipation. No acute inflammatory changes about the visualized bowels. Mild colonic diverticulosis without evidence for acute diverticulitis. Vascular/Lymphatic: Extensive aorto bi-iliac atherosclerotic disease with extensive vascular calcifications throughout the pelvic vasculature. No adenopathy. Reproductive: Multiple calcifications within the uterus likely reflect small calcified fibroids. Uterus and ovaries otherwise within normal limits. Other: No free air or fluid. Small irregular fat containing paraumbilical hernia with associated suture. Musculoskeletal: Mild diffuse anasarca. Visualized musculature of the pelvis and upper thighs atrophic in appearance. Sacral decubitus ulcer seen at the lower midline (series 10, image 119). Ulceration extends to the level of the underlying coccyx. No osseous  erosion or periosteal reaction to suggest osteomyelitis within the underlying coccyx. No loculated collections identified on this noncontrast examination. No abnormal soft tissue emphysema. No acute osseous abnormality. No discrete lytic or blastic osseous lesions. Degenerative changes noted within the visualized lower lumbar spine. IMPRESSION: 1. Sacral decubitus ulcer at the mid posterior pelvis, extending to the level of the underlying coccyx. No evidence for associated osteomyelitis by CT. No discrete or drainable collections identified. 2. Moderate to large volume stool within the visualized colon, consistent with constipation. 3. Nonobstructive right renal nephrolithiasis. 4. Extensive atherosclerosis. Electronically Signed   By: Jeannine Boga M.D.   On: 06/08/2018 18:17   Mr Pelvis W Wo Contrast  Result Date: 06/09/2018 CLINICAL DATA:  Sacral decubitus ulcer.  Evaluate for osteomyelitis. EXAM: MRI PELVIS WITHOUT AND WITH CONTRAST TECHNIQUE: Multiplanar multisequence MR imaging of the pelvis was performed both before and after administration of intravenous contrast. CONTRAST:  8 mL Gadavist intravenous contrast. COMPARISON:  CT pelvis from yesterday. FINDINGS: Urinary Tract:  No abnormality visualized. Bowel:  Unremarkable visualized pelvic bowel loops. Vascular/Lymphatic: No pathologically enlarged lymph nodes. No significant vascular abnormality seen. Reproductive:  Small uterine fibroids again noted.  No adnexal mass. Other:  None. Musculoskeletal: Midline sacral decubitus ulcer extending to the coccyx. Marrow edema within the coccyx with graying of the posterior cortex and faintly decreased T1 marrow signal. No abscess. Mild presacral soft tissue edema. Mild edema within the bilateral gluteus maximus muscles adjacent to the sacrum, likely reactive. No fluid collection. IMPRESSION: 1. Midline sacral decubitus ulcer extending to the coccyx with evidence of early coccygeal osteomyelitis. No  abscess. Electronically Signed   By: Titus Dubin M.D.   On: 06/09/2018 15:14        Scheduled Meds: . amiodarone  200 mg Oral BID  . atorvastatin  40 mg Oral Daily  . collagenase   Topical BID  . feeding supplement (GLUCERNA SHAKE)  237 mL Oral TID BM  . hydrALAZINE  100 mg Oral TID  . insulin aspart  0-9 Units Subcutaneous Q4H  . insulin glargine  14 Units Subcutaneous QPC breakfast  . latanoprost  1 drop Both Eyes QHS  . levothyroxine  25 mcg Oral Q0600  . metoprolol tartrate  12.5 mg Oral BID  . metroNIDAZOLE  250 mg Oral Daily  . nutrition supplement (JUVEN)  1 packet Oral BID BM  . pantoprazole  40 mg Oral Daily  . polyethylene glycol  17 g Oral BID  . sildenafil  20 mg Oral TID   Continuous Infusions: . magnesium sulfate 1 - 4 g bolus IVPB       LOS: 2 days    Bonnielee Haff, MD [facility] Pager [phone]   If 7PM-7AM, please contact night-coverage [email or web address]  Password [email or web address] 06/10/2018, 1:04 PM

## 2018-06-10 NOTE — Progress Notes (Signed)
CSW following for discharge needs back to [employer].   Patient will need updated PT/OT notes for new insurance authorization approval.  CSW informed nursing staff.   Kathrin Greathouse, Marlinda Mike, MSW Clinical Social Worker  [phone] 06/10/2018  10:00 AM

## 2018-06-10 NOTE — Progress Notes (Addendum)
[facility] Progress Note     Subjective: CC-  Feeling better today. S/p 2 units PRBCs yesterday, hemoglobin up to 9.1 today. No bleeding from sacral wound since admission.  Objective: Vital signs in last 24 hours: Temp:  [98.1 F (36.7 C)-99 F (37.2 C)] 98.1 F (36.7 C) (11/27 0445) Pulse Rate:  [54-67] 60 (11/27 0445) Resp:  [9-19] 14 (11/27 0445) BP: (120-188)/(39-72) 150/51 (11/27 0445) SpO2:  [98 %-100 %] 100 % (11/27 0445) Last BM Date: 05/30/18  Intake/Output from previous day: 11/26 0701 - 11/27 0700 In: 5053 [P.O.:600; I.V.:250; Blood:630; IV Piggyback:100] Out: 800 [Urine:800] Intake/Output this shift: Total I/O In: -  Out: 300 [Urine:300]  PE: Gen:  Alert, NAD, pleasant HEENT: EOM's intact, pupils equal and round Card:  RRR, +murmur Pulm:  CTAB, no W/R/R, effort normal Abd: Soft, NT/ND, +BS, no HSM Psych: A&Ox3  Skin: no rashes noted GU: sacral wound with 8x10x2cm area beefy red, central aspect of wound deeper with palpable bone and trace slough, no active bleeding, no tunneling      Lab Results:  Recent Labs    06/09/18 0704 06/10/18 0456  WBC 9.5 9.9  HGB 7.7* 9.1*  HCT 23.4* 27.6*  PLT 289 264   BMET Recent Labs    06/09/18 0704 06/10/18 0456  NA 136 134*  K 4.3 4.0  CL 105 105  CO2 22 20*  GLUCOSE 100* 113*  BUN 58* 48*  CREATININE 1.58* 1.30*  CALCIUM 9.2 8.9   PT/INR No results for input(s): LABPROT, INR in the last 72 hours. CMP     Component Value Date/Time   NA 134 (L) 06/10/2018 0456   NA 132 (L) 06/10/2017 1028   NA 134 (L) 05/26/2017 0949   K 4.0 06/10/2018 0456   K 4.0 05/26/2017 0949   CL 105 06/10/2018 0456   CL 96 (L) 03/31/2012 1513   CO2 20 (L) 06/10/2018 0456   CO2 24 05/26/2017 0949   GLUCOSE 113 (H) 06/10/2018 0456   GLUCOSE 263 (H) 05/26/2017 0949   GLUCOSE 291 (H) 03/31/2012 1513   BUN 48 (H) 06/10/2018 0456   BUN 23 06/10/2017 1028   BUN 29.2 (H) 05/26/2017 0949   CREATININE 1.30  (H) 06/10/2018 0456   CREATININE 1.27 (H) 06/01/2018 1014   CREATININE 1.4 (H) 05/26/2017 0949   CALCIUM 8.9 06/10/2018 0456   CALCIUM 8.9 05/26/2017 0949   PROT 6.6 06/10/2018 0456   PROT 7.3 05/26/2017 0949   ALBUMIN 2.8 (L) 06/10/2018 0456   ALBUMIN 3.2 (L) 05/26/2017 0949   AST 45 (H) 06/10/2018 0456   AST 46 (H) 06/01/2018 1014   AST 29 05/26/2017 0949   ALT 32 06/10/2018 0456   ALT 34 06/01/2018 1014   ALT 23 05/26/2017 0949   ALKPHOS 116 06/10/2018 0456   ALKPHOS 157 (H) 05/26/2017 0949   BILITOT 2.1 (H) 06/10/2018 0456   BILITOT 2.3 (H) 06/01/2018 1014   BILITOT 2.19 (H) 05/26/2017 0949   GFRNONAA 44 (L) 06/10/2018 0456   GFRNONAA 44 (L) 06/01/2018 1014   GFRAA 51 (L) 06/10/2018 0456   GFRAA 51 (L) 06/01/2018 1014   Lipase     Component Value Date/Time   LIPASE 31 05/26/2016 1235       Studies/Results: Ct Pelvis Wo Contrast  Result Date: 06/08/2018 CLINICAL DATA:  Initial evaluation for sacral decubitus ulcer. EXAM: CT PELVIS WITHOUT CONTRAST TECHNIQUE: Multidetector CT imaging of the pelvis was performed following the standard protocol without intravenous contrast. COMPARISON:  Prior CT from 05/08/2017. FINDINGS: Urinary Tract: Punctate nonobstructive calculi noted within the partially visualized lower pole the right kidney. Visualized kidneys otherwise unremarkable. Visualized ureters of normal caliber without abnormality. Bladder moderately distended without acute abnormality. Bowel: Anastomotic suture present at the right colon. Visualized bowels of normal caliber without evidence for obstruction. Moderate to large volume retained stool within the distal colon and rectal vault, suggesting constipation. No acute inflammatory changes about the visualized bowels. Mild colonic diverticulosis without evidence for acute diverticulitis. Vascular/Lymphatic: Extensive aorto bi-iliac atherosclerotic disease with extensive vascular calcifications throughout the pelvic  vasculature. No adenopathy. Reproductive: Multiple calcifications within the uterus likely reflect small calcified fibroids. Uterus and ovaries otherwise within normal limits. Other: No free air or fluid. Small irregular fat containing paraumbilical hernia with associated suture. Musculoskeletal: Mild diffuse anasarca. Visualized musculature of the pelvis and upper thighs atrophic in appearance. Sacral decubitus ulcer seen at the lower midline (series 10, image 119). Ulceration extends to the level of the underlying coccyx. No osseous erosion or periosteal reaction to suggest osteomyelitis within the underlying coccyx. No loculated collections identified on this noncontrast examination. No abnormal soft tissue emphysema. No acute osseous abnormality. No discrete lytic or blastic osseous lesions. Degenerative changes noted within the visualized lower lumbar spine. IMPRESSION: 1. Sacral decubitus ulcer at the mid posterior pelvis, extending to the level of the underlying coccyx. No evidence for associated osteomyelitis by CT. No discrete or drainable collections identified. 2. Moderate to large volume stool within the visualized colon, consistent with constipation. 3. Nonobstructive right renal nephrolithiasis. 4. Extensive atherosclerosis. Electronically Signed   By: Jeannine Boga M.D.   On: 06/08/2018 18:17   Mr Pelvis W Wo Contrast  Result Date: 06/09/2018 CLINICAL DATA:  Sacral decubitus ulcer.  Evaluate for osteomyelitis. EXAM: MRI PELVIS WITHOUT AND WITH CONTRAST TECHNIQUE: Multiplanar multisequence MR imaging of the pelvis was performed both before and after administration of intravenous contrast. CONTRAST:  8 mL Gadavist intravenous contrast. COMPARISON:  CT pelvis from yesterday. FINDINGS: Urinary Tract:  No abnormality visualized. Bowel:  Unremarkable visualized pelvic bowel loops. Vascular/Lymphatic: No pathologically enlarged lymph nodes. No significant vascular abnormality seen. Reproductive:   Small uterine fibroids again noted.  No adnexal mass. Other:  None. Musculoskeletal: Midline sacral decubitus ulcer extending to the coccyx. Marrow edema within the coccyx with graying of the posterior cortex and faintly decreased T1 marrow signal. No abscess. Mild presacral soft tissue edema. Mild edema within the bilateral gluteus maximus muscles adjacent to the sacrum, likely reactive. No fluid collection. IMPRESSION: 1. Midline sacral decubitus ulcer extending to the coccyx with evidence of early coccygeal osteomyelitis. No abscess. Electronically Signed   By: Titus Dubin M.D.   On: 06/09/2018 15:14    Anti-infectives: Anti-infectives (From admission, onward)   Start     Dose/Rate Route Frequency Ordered Stop   06/09/18 1000  metroNIDAZOLE (FLAGYL) tablet 250 mg     250 mg Oral Daily 06/09/18 0401     06/08/18 1745  clindamycin (CLEOCIN) IVPB 600 mg  Status:  Discontinued     600 mg 100 mL/hr over 30 Minutes Intravenous  Once 06/08/18 1731 06/08/18 1732   06/08/18 1745  vancomycin (VANCOCIN) 1,500 mg in sodium chloride 0.9 % 500 mL IVPB     1,500 mg 250 mL/hr over 120 Minutes Intravenous  Once 06/08/18 1740 06/08/18 2030       Assessment/Plan Severe pulmonary hypertension AS H/ocolon cancers/p right hemicolectomy 2008 Dr. Hulen Skains DM2 HTN Myelodysplastic syndrome with transfusion dependent anemia (requires  blood transfusion about every 3 weeks) - sees Dr. Alen Blew Symptomatic anemia - s/p 2 units PRBCs 11/27, Hg 9.1 stable today Code status DNR  Sacral wound - developed during recent hospitalization 05/11/18 through 05/26/18 - MRI pelvis shows early coccygeal osteomyelitis  - BID wet to dry dressing changes with Santyl - shower with wound open  ID - clinda/vancomycin 11/25, flagyl 11/26>> VTE - SCDs, no chemical DVT prophylaxis due to bleeding FEN - CM diet Foley - none  Plan - Wound stable today without any active bleeding. Silver nitrate sticks at bedside to use PRN if  bleeding recurs. Continue dressing changes as above.   Discussed importance of keeping pressure off the wound, eating enough protein, and good wound care. D/c wick and use bedside commode.  Patient stable for d/c back to rehab today from surgical standpoint. If she is not being discharged today would recommend PT consult.  General surgery will sign off, please call with any concerns.   LOS: 2 days    Wellington Hampshire , [facility] 06/10/2018, 9:10 AM Pager: [phone]  Agree with above.  Alphonsa Overall, MD, [facility] Pager: [phone] Office phone:  [phone]

## 2018-06-10 NOTE — Care Management Note (Signed)
Case Management Note  Patient Details  Name: Alverda Nazzaro MRN: [id number] Date of Birth: [birth year]  Subjective/Objective:         Plan for d/c to SNF, discharge planning per CSW. [phone]           Action/Plan:   Expected Discharge Date:  (unknown)               Expected Discharge Plan:  [place]  In-House Referral:  Clinical Social Work  Discharge planning Services  CM Consult  Post Acute Care Choice:  NA Choice offered to:  Patient  DME Arranged:  N/A DME Agency:  NA  HH Arranged:  NA HH Agency:  NA  Status of Service:  Completed, signed off  If discussed at [employer] of Stay Meetings, dates discussed:    Additional Comments:  Guadalupe Maple, RN 06/10/2018, 1:13 PM

## 2018-06-10 NOTE — Consult Note (Signed)
Date of Admission:  06/08/2018          Reason for Consult:  Coccygeal osteomyelitis   Referring Provider: Dr. Maryland Pink   Assessment:  1. 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 Myelodysplastic syndrome 3. Tract at hospital stay including stay in the ICU recently at [facility]  Plan:  1. So plastic surgery for consideration of biopsy of bone to be sent for cultures of the back and give tailored antimicrobial therapy for 6 weeks 2. Plastic surgery does not think this is reasonable with the patient does not want to proceed with such a biopsy then would proceed with an empiric course of therapy with vancomycin cefepime and Flagyl 6 weeks.  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 amiodarone  200 mg Oral BID  . atorvastatin  40 mg Oral Daily  . collagenase   Topical BID  . feeding supplement (GLUCERNA SHAKE)  237 mL Oral TID BM  . hydrALAZINE  100 mg Oral TID  . insulin aspart  0-9 Units Subcutaneous Q4H  . insulin glargine  14 Units Subcutaneous QPC breakfast  . latanoprost  1 drop Both Eyes QHS  . levothyroxine  25 mcg Oral Q0600  . metoprolol tartrate  12.5 mg Oral BID  . metroNIDAZOLE  250 mg Oral Daily  . nutrition supplement (JUVEN)  1 packet Oral BID BM  . pantoprazole  40 mg Oral Daily  . polyethylene glycol  17 g Oral BID  . sildenafil  20 mg Oral TID   Continuous Infusions: PRN Meds:.acetaminophen **OR** acetaminophen, bisacodyl, HYDROcodone-acetaminophen, milk and molasses, ondansetron **OR** ondansetron (ZOFRAN) IV, silver nitrate applicators, traMADol  HPI: Patricia Mata is a 62 y.o. female history  severe aortic stenosis, myelodysplastic syndrome colon cancer status post hemicolectomy who had protracted hospital stay in which she had severe atrial fibrillation and hypotension all failure requiring CVVHD and multiple transfusions and a protracted stay in the ICU.  She developed a decubitus ulcer that has become a stage IV ulcer and has been at skilled nursing facility.  Sent to emergency department on November 25 due to worsening bleeding and blood clots found in her sacral decubitus ulcer.  She was seen by her wound care specialist Dr. Renard Hamper who try to cauterize this area but it continued to bleed.  She was admitted via the emergency department initially given antimicrobials in the form of vancomycin and metronidazole.  These were discontinued faction felt not to be present in the soft tissues.  Ultimately an MRI was performed which showed evidence of early coccygeal osteomyelitis.  She herself is completely nontoxic appearing and without fevers or other systemic symptoms at present.  I had a lengthy discussion with the patient and her husband about proceeding with potential biopsy by plastic surgery to obtain a cultures we could give targeted antimicrobial therapy for osteomyelitis.   Review of Systems: Review of Systems  Constitutional: Negative for chills, diaphoresis, fever, malaise/fatigue and weight loss.  HENT: Negative for congestion, hearing loss, sore throat and tinnitus.   Eyes: Negative for blurred vision and double vision.  Respiratory: Negative for  cough, sputum production, shortness of breath and wheezing.   Cardiovascular: Positive for palpitations. Negative for chest pain and leg swelling.  Gastrointestinal: Negative for abdominal pain, blood in stool, constipation, diarrhea, heartburn, melena, nausea and vomiting.  Genitourinary: Negative for dysuria, flank pain and hematuria.  Musculoskeletal: Negative for back pain, falls, joint pain and myalgias.  Skin: Negative for itching  and rash.  Neurological: Negative for dizziness, sensory change, focal weakness, loss of consciousness, weakness and headaches.  Endo/Heme/Allergies: Does not bruise/bleed easily.  Psychiatric/Behavioral: Negative for depression, memory loss and suicidal ideas. The patient is not nervous/anxious.     Past Medical History:  Diagnosis Date  . Cancer [facility]) 2008   Colon   . Diabetes mellitus without complication ([place])   . Hypertension   . Macrocytic anemia     Social History   Tobacco Use  . Smoking status: Former Smoker    Types: Cigarettes    Last attempt to quit: 01/31/2001    Years since quitting: 17.3  . Smokeless tobacco: Former Network engineer Use Topics  . Alcohol use: Yes    Alcohol/week: 2.0 standard drinks    Types: 2 Shots of liquor per week  . Drug use: Never    Family History  Problem Relation Age of Onset  . Hypertension Mother   . Heart attack Father   . Hypertension Sister   . Hypertension Brother   . Hypertension Sister   . Hypertension Sister   . Prostate cancer Brother   . HIV/AIDS Brother    Allergies  Allergen Reactions  . Ancef [Cefazolin] Itching    Severe itching- after procedure, ancef was the antibiotic.-04/02/17  . Sulfa Antibiotics Rash and Other (See Comments)    Blisters, also    OBJECTIVE: Blood pressure (!) 115/42, pulse 67, temperature 98.7 F (37.1 C), temperature source Oral, resp. rate 16, height 5\' 4"  (1.626 m), weight 84.8 kg, SpO2 100 %.  Physical Exam  Constitutional: She is oriented to person, place, and time. She appears well-developed and well-nourished. She is cooperative. She does not appear ill. No distress.  HENT:  Head: Normocephalic and atraumatic.  Right Ear: Hearing and external ear normal.  Left Ear: Hearing and external ear normal.  Nose: No rhinorrhea or nasal deformity. No epistaxis.  Eyes: Pupils are equal, round, and reactive to light. Conjunctivae and EOM are normal. Right conjunctiva is not injected.  Left conjunctiva is not injected. No scleral icterus.  Neck: Normal range of motion. Neck supple. No JVD present.  Cardiovascular: Normal rate, regular rhythm, S1 normal and S2 normal. Exam reveals no friction rub.  No murmur heard. Abdominal: Soft. Normal appearance and bowel sounds are normal. She exhibits no distension and no ascites. There is no hepatosplenomegaly. There is no tenderness.  Musculoskeletal: Normal range of motion.       Right shoulder: Normal.       Left shoulder: Normal.       Right hip: Normal.       Left hip: Normal.       Right knee: Normal.       Left knee: Normal.  Lymphadenopathy:       Head (right side): No submandibular, no preauricular and no posterior auricular adenopathy present.       Head (left side): No submandibular, no preauricular and no posterior auricular adenopathy present.    She has no cervical adenopathy.       Right cervical: No superficial cervical and no deep cervical adenopathy present.  Left cervical: No superficial cervical and no deep cervical adenopathy present.  Neurological: She is alert and oriented to person, place, and time. She has normal strength. No sensory deficit. Coordination and gait normal.  Skin: Skin is warm, dry and intact. No abrasion, no bruising, no ecchymosis, no lesion and no rash noted. She is not diaphoretic. No cyanosis or erythema. No pallor. Nails show no clubbing.    The decubitus ulcer was not examined  Psychiatric: She has a normal mood and affect. Her speech is normal and behavior is normal. Judgment and thought content normal. Cognition and memory are normal. She is attentive.    Lab Results Lab Results  Component Value Date   WBC 9.9 06/10/2018   HGB 9.1 (L) 06/10/2018   HCT 27.6 (L) 06/10/2018   MCV 92.0 06/10/2018   PLT 264 06/10/2018    Lab Results  Component Value Date   CREATININE 1.30 (H) 06/10/2018   BUN 48 (H) 06/10/2018   NA 134 (L) 06/10/2018   K 4.0 06/10/2018   CL 105  06/10/2018   CO2 20 (L) 06/10/2018    Lab Results  Component Value Date   ALT 32 06/10/2018   AST 45 (H) 06/10/2018   ALKPHOS 116 06/10/2018   BILITOT 2.1 (H) 06/10/2018     Microbiology: No results found for this or any previous visit (from the past 240 hour(s)).  Alcide Evener, [place] [facility] [phone] pager  06/10/2018, 6:14 PM

## 2018-06-10 NOTE — Progress Notes (Signed)
OT Cancellation Note  Patient Details Name: Patricia Mata MRN: [id number] DOB: 09-21-1955   Cancelled Treatment:    Reason Eval/Treat Not Completed: Other (comment); pt eating upon arrival to room. Will follow up for OT eval as schedule permits.   Lou Cal, OT Supplemental Rehabilitation Services Pager [phone] Office [phone]   Raymondo Band 06/10/2018, 3:18 PM

## 2018-06-10 NOTE — Progress Notes (Signed)
Initial Nutrition Assessment  DOCUMENTATION CODES:   Obesity unspecified  INTERVENTION:    Glucerna Shake po TID, each supplement provides 220 kcal and 10 grams of protein  Juven Fruit Punch BID, each serving provides 95kcal and 2.5g of protein (amino acids glutamine and arginine)  NUTRITION DIAGNOSIS:   Increased nutrient needs related to wound healing as evidenced by estimated needs.  GOAL:   Patient will meet greater than or equal to 90% of their needs  MONITOR:   PO intake, Supplement acceptance, Labs, Weight trends, I & O's  REASON FOR ASSESSMENT:   Consult Assessment of nutrition requirement/status  ASSESSMENT:   Patient with PMH significant for severe pulmonary HTN, colon cancer(in remission, no active tx), DM, HTN, anemia, myelodysplastic syndrome, and severe AS. Recently admitted Oct 20- Nov 12 for Afib and AKI due to low perfusion. Presents this admission with symptomatic anemia likely secondary to recurrent bleeding from decubitus ulcer.    Pt endorses having a loss in appetite for one month PTA due to her recent admission. States she does not like hospital food and this has caused her to eat less. Unable to quantify how much she ate daily over the last month. Prior to this time period she would eat two meals that her husband prepared that consisted of a meat, vegetable, and grain (they skipped breakfast, as they are retired and like to sleep in). Pt has not drank Glucerna since her last admission but is willing to try. She ate 100% of her breakfast this morning without complication.   Pt endorses she used to weigh 300 lb a few years ago and decided to lose 100 lb for health reasons. She did this by watching what she ate and exercise. Records indicate pt weighed 214 lb on 04/16/18 and 187 lb this admission (12.6% wt loss in 2 months, significant for time frame). Per her reports this was unintentional. Nutrition-Focused physical exam completed. Pt has not walked much  since being discharged to SNF on Nov 12. Suspect pt has some form of malnutrition but unable to diagnose with given dietary recall.   Medications reviewed and include: miralax Labs reviewed: Na 134 (L) Mg 1.5 (L)   NUTRITION - FOCUSED PHYSICAL EXAM:    Most Recent Value  Orbital Region  No depletion  Upper Arm Region  Mild depletion  Thoracic and Lumbar Region  Unable to assess  Buccal Region  No depletion  Temple Region  No depletion  Clavicle Bone Region  No depletion  Clavicle and Acromion Bone Region  No depletion  Scapular Bone Region  Unable to assess  Dorsal Hand  No depletion  Patellar Region  No depletion  Anterior Thigh Region  No depletion  Posterior Calf Region  No depletion  Edema (RD Assessment)  None     Diet Order:   Diet Order            Diet Carb Modified Fluid consistency: Thin; Room service appropriate? Yes  Diet effective now              EDUCATION NEEDS:   Education needs have been addressed  Skin:  Skin Integrity Issues:: Stage IV Stage IV: sacrum  Last BM:  PTA  Height:   Ht Readings from Last 1 Encounters:  06/08/18 5\' 4"  (1.626 m)    Weight:   Wt Readings from Last 1 Encounters:  06/08/18 84.8 kg    Ideal Body Weight:  54.5 kg  BMI:  Body mass index is 32.1 kg/m.  Estimated Nutritional Needs:   Kcal:  1600-1800 kcal  Protein:  90-105 grams  Fluid:  >/= 1.6 L/day   Mariana Single RD, LDN Clinical Nutrition Pager # - [phone]

## 2018-06-10 NOTE — Evaluation (Signed)
Physical Therapy Evaluation Patient Details Name: Patricia Mata MRN: [id number] DOB: 1956-04-11 Today's Date: 06/10/2018   History of Present Illness  62 y.o. female with medical history significant of severe pulmonary hypertension, colon cancer (in remission, no active tx), diabetes, hypertension, anemia, myelodysplastic syndrome, severe AS who presented with  Sacral wound developed after her ICU stay when she was admitted from 10/20 to 11/12.  She was discharged to rehab facility where she has been having blood clots found in the dressings   Clinical Impression  Pt admitted with above diagnosis. Pt currently with functional limitations due to the deficits listed below (see PT Problem List). Pt ambulated 110' with RW, distance limited by fatigue. Pt will benefit from skilled PT to increase their independence and safety with mobility to allow discharge to the venue listed below.       Follow Up Recommendations Supervision for mobility/OOB;SNF(SNF for wound care)    Equipment Recommendations  None recommended by PT(TBD next venue)    Recommendations for Other Services       Precautions / Restrictions Precautions Precautions: Fall Precaution Comments: Sacral wound; pt denies falls in the past 1 year Restrictions Weight Bearing Restrictions: No      Mobility  Bed Mobility Overal bed mobility: Modified Independent Bed Mobility: Sidelying to Sit   Sidelying to sit: Modified independent (Device/Increase time)       General bed mobility comments: used bedrail; bed flat  Transfers Overall transfer level: Needs assistance Equipment used: Rolling walker (2 wheeled) Transfers: Sit to/from Stand Sit to Stand: Min guard         General transfer comment: VCs for hand placement, min guard safety  Ambulation/Gait Ambulation/Gait assistance: Min guard Gait Distance (Feet): 110 Feet Assistive device: Rolling walker (2 wheeled) Gait Pattern/deviations: Step-through pattern Gait  velocity: wfl   General Gait Details: steady, no loss of balance, distance limited by fatigue, 1 standing rest break of ~25 seconds  Stairs            Wheelchair Mobility    Modified Rankin (Stroke Patients Only)       Balance   Sitting-balance support: Feet supported;Single extremity supported Sitting balance-Leahy Scale: Good     Standing balance support: Bilateral upper extremity supported Standing balance-Leahy Scale: Poor Standing balance comment: Reliant on UE support                              Pertinent Vitals/Pain Pain Score: 5  Pain Location: sacral wound Pain Descriptors / Indicators: Grimacing;Discomfort Pain Intervention(s): Limited activity within patient's tolerance;Monitored during session;Premedicated before session    Home Living Family/patient expects to be discharged to:: Skilled nursing facility                      Prior Function Level of Independence: Needs assistance   Gait / Transfers Assistance Needed: walks with RW and assistance at SNF  ADL's / Homemaking Assistance Needed: bathed with min assist         Hand Dominance        Extremity/Trunk Assessment   Upper Extremity Assessment Upper Extremity Assessment: Overall WFL for tasks assessed    Lower Extremity Assessment Lower Extremity Assessment: Overall WFL for tasks assessed    Cervical / Trunk Assessment Cervical / Trunk Assessment: Normal  Communication   Communication: No difficulties  Cognition Arousal/Alertness: Awake/alert Behavior During Therapy: WFL for tasks assessed/performed Overall Cognitive Status: Within Functional Limits for tasks  assessed                                        General Comments      Exercises     Assessment/Plan    PT Assessment Patient needs continued PT services  PT Problem List Decreased balance;Decreased mobility;Decreased activity tolerance;Decreased skin integrity;Pain       PT  Treatment Interventions DME instruction;Gait training;Functional mobility training;Therapeutic activities;Therapeutic exercise;Balance training;Cognitive remediation;Patient/family education    PT Goals (Current goals can be found in the Care Plan section)  Acute Rehab PT Goals Patient Stated Goal: to walk farther, get stronger PT Goal Formulation: With patient/family Time For Goal Achievement: 06/24/18 Potential to Achieve Goals: Good    Frequency Min 2X/week   Barriers to discharge        Co-evaluation               AM-PAC PT "6 Clicks" Mobility  Outcome Measure Help needed turning from your back to your side while in a flat bed without using bedrails?: A Little Help needed moving from lying on your back to sitting on the side of a flat bed without using bedrails?: A Little Help needed moving to and from a bed to a chair (including a wheelchair)?: A Little Help needed standing up from a chair using your arms (e.g., wheelchair or bedside chair)?: A Little Help needed to walk in hospital room?: A Little Help needed climbing 3-5 steps with a railing? : A Lot 6 Click Score: 17    End of Session Equipment Utilized During Treatment: Gait belt Activity Tolerance: Patient tolerated treatment well Patient left: in chair;with call bell/phone within reach;with family/visitor present Nurse Communication: Mobility status PT Visit Diagnosis: Difficulty in walking, not elsewhere classified (R26.2);Pain    Time: 3646-8032 PT Time Calculation (min) (ACUTE ONLY): 18 min   Charges:   PT Evaluation $PT Eval Low Complexity: 1 Low        Blondell Reveal Kistler PT 06/10/2018  Acute Rehabilitation Services Pager [phone] Office [phone]

## 2018-06-11 LAB — CBC
HCT: 25.1 % — ABNORMAL LOW (ref 36.0–46.0)
Hemoglobin: 8.3 g/dL — ABNORMAL LOW (ref 12.0–15.0)
MCH: 30.2 pg (ref 26.0–34.0)
MCHC: 33.1 g/dL (ref 30.0–36.0)
MCV: 91.3 fL (ref 80.0–100.0)
Platelets: 254 10*3/uL (ref 150–400)
RBC: 2.75 MIL/uL — ABNORMAL LOW (ref 3.87–5.11)
RDW: 16.8 % — ABNORMAL HIGH (ref 11.5–15.5)
WBC: 8.9 10*3/uL (ref 4.0–10.5)
nRBC: 0 % (ref 0.0–0.2)

## 2018-06-11 LAB — GLUCOSE, CAPILLARY
Glucose-Capillary: 117 mg/dL — ABNORMAL HIGH (ref 70–99)
Glucose-Capillary: 146 mg/dL — ABNORMAL HIGH (ref 70–99)
Glucose-Capillary: 149 mg/dL — ABNORMAL HIGH (ref 70–99)
Glucose-Capillary: 170 mg/dL — ABNORMAL HIGH (ref 70–99)
Glucose-Capillary: 228 mg/dL — ABNORMAL HIGH (ref 70–99)
Glucose-Capillary: 229 mg/dL — ABNORMAL HIGH (ref 70–99)
Glucose-Capillary: 69 mg/dL — ABNORMAL LOW (ref 70–99)

## 2018-06-11 LAB — BASIC METABOLIC PANEL
Anion gap: 9 (ref 5–15)
BUN: 52 mg/dL — ABNORMAL HIGH (ref 8–23)
CO2: 19 mmol/L — ABNORMAL LOW (ref 22–32)
Calcium: 8.8 mg/dL — ABNORMAL LOW (ref 8.9–10.3)
Chloride: 104 mmol/L (ref 98–111)
Creatinine, Ser: 1.89 mg/dL — ABNORMAL HIGH (ref 0.44–1.00)
GFR calc Af Amer: 32 mL/min — ABNORMAL LOW (ref >60)
GFR calc non Af Amer: 28 mL/min — ABNORMAL LOW (ref >60)
Glucose, Bld: 142 mg/dL — ABNORMAL HIGH (ref 70–99)
Potassium: 3.9 mmol/L (ref 3.5–5.1)
Sodium: 132 mmol/L — ABNORMAL LOW (ref 135–145)

## 2018-06-11 LAB — C-REACTIVE PROTEIN: CRP: 3.5 mg/dL — ABNORMAL HIGH (ref <1.0)

## 2018-06-11 LAB — SEDIMENTATION RATE: Sed Rate: 68 mm/hr — ABNORMAL HIGH (ref 0–22)

## 2018-06-11 MED ORDER — LIP MEDEX EX OINT
TOPICAL_OINTMENT | CUTANEOUS | Status: AC
  Administered 2018-06-11: 13:00:00
  Filled 2018-06-11: qty 7

## 2018-06-11 MED ORDER — SENNA 8.6 MG PO TABS
2.0000 | ORAL_TABLET | Freq: Every day | ORAL | Status: DC
  Administered 2018-06-11 – 2018-06-14 (×4): 17.2 mg via ORAL
  Filled 2018-06-11 (×4): qty 2

## 2018-06-11 MED ORDER — SODIUM CHLORIDE 0.45 % IV SOLN
INTRAVENOUS | Status: AC
  Administered 2018-06-11 – 2018-06-12 (×2): via INTRAVENOUS

## 2018-06-11 MED ORDER — SODIUM CHLORIDE 0.45 % IV BOLUS
500.0000 mL | Freq: Once | INTRAVENOUS | Status: AC
  Administered 2018-06-11: 08:00:00 via INTRAVENOUS

## 2018-06-11 NOTE — Progress Notes (Addendum)
Hypoglycemic Event  CBG: 69  Treatment: Orange juice  Symptoms: None  Follow-up CBG: Time: 0112 CBG Result:117  Possible Reasons for Event: 5 units of regular insulin given  Comments/MD notified:    Patricia Mata

## 2018-06-11 NOTE — Anesthesia Preprocedure Evaluation (Addendum)
Anesthesia Evaluation  Patient identified by MRN, date of birth, ID band Patient awake    Reviewed: Allergy & Precautions, H&P , NPO status , Patient's Chart, lab work & pertinent test results, reviewed documented beta blocker date and time   Airway Mallampati: II  TM Distance: >3 FB Neck ROM: full    Dental no notable dental hx. (+) Teeth Intact   Pulmonary neg pulmonary ROS, former smoker,    Pulmonary exam normal breath sounds clear to auscultation       Cardiovascular Exercise Tolerance: Good hypertension, Pt. on medications and Pt. on home beta blockers negative cardio ROS   Rhythm:regular Rate:Normal + Systolic murmurs, + Diastolic murmurs and + Systolic Click -ECHO 19  Left ventricle: The cavity size was normal. There was moderate   concentric hypertrophy. Systolic function was vigorous. The   estimated ejection fraction was in the range of 65% to 70%. Wall   motion was normal; Features are consistent with a pseudonormal left   ventricular filling pattern, with concomitant abnormal relaxation   and increased filling pressure (grade 2 diastolic dysfunction).   Doppler parameters are consistent with high ventricular filling   pressure. - Aortic valve: Trileaflet; moderately thickened, moderately   calcified leaflets. Valve mobility was restricted. There was   moderate stenosis.  Valve area (VTI): 1.28 cm^2. Valve area   (Vmax): 1.01 cm^2. Valve area (Vmean): 1.18 cm^2. - Mitral valve: Calcified annulus. Moderate diffuse thickening,   calcification, and elongation. The findings are consistent with   mild to moderate stenosis. Deceleration time: 324 ms. - Tricuspid valve: There was moderate regurgitation. - Pulmonic valve: There was trivial regurgitation. - Pulmonary arteries: PA peak pressure: 93 mm Hg (S).  - The right ventricular systolic pressure was increased consistent   with severe pulmonary hypertension.    Neuro/Psych negative neurological ROS  negative psych ROS   GI/Hepatic negative GI ROS, Neg liver ROS,   Endo/Other  negative endocrine ROSdiabetes, Insulin Dependent  Renal/GU CRFRenal diseasenegative Renal ROS  negative genitourinary   Musculoskeletal   Abdominal   Peds  Hematology negative hematology ROS (+) Blood dyscrasia, anemia ,   Anesthesia Other Findings   Reproductive/Obstetrics negative OB ROS                          Anesthesia Physical Anesthesia Plan  ASA: IV  Anesthesia Plan: General   Post-op Pain Management:    Induction: Intravenous  PONV Risk Score and Plan: 3 and Ondansetron and Treatment may vary due to age or medical condition  Airway Management Planned: LMA  Additional Equipment:   Intra-op Plan:   Post-operative Plan:   Informed Consent: I have reviewed the patients History and Physical, chart, labs and discussed the procedure including the risks, benefits and alternatives for the proposed anesthesia with the patient or authorized representative who has indicated his/her understanding and acceptance.   Dental Advisory Given  Plan Discussed with: CRNA, Anesthesiologist and Surgeon  Anesthesia Plan Comments: ( )      Anesthesia Quick Evaluation

## 2018-06-11 NOTE — Progress Notes (Signed)
PROGRESS NOTE    Patricia Mata  DGU:[id number] DOB: 1956/05/04 DOA: 06/08/2018 PCP: Glendale Chard, MD  Brief Narrative:  Patricia Mata is a 63 y.o. female with medical history significant of severe pulmonary hypertension, colon cancer(in remission, no active tx), diabetes, hypertension, anemia, myelodysplastic syndrome, severe AS who presented with bleeding from her Sacral wound that developed after her ICU stay when she was admitted from 10/20 to 11/12.  She was discharged to rehab facility where she has been having blood clots found in the dressings no purulent discharge, no foul smell, noted no chills or fevers.  She requires transfusion of PRBC every 2 to 3 weeks secondary to MDS.  Patient was hospitalized due to severe anemia and bleeding from her sacral wound.  Assessment & Plan:   Symptomatic anemia Multifactorial related to her hx of MDS (transfusion dependent, requires transfusions every few weeks per past notes) and with possible contribution from bleeding decubitus ulcer.  She was transfused 2 units of PRBC.  Hemoglobin has improved to 9.1 from 6.9.  Noted to be 8.3 today.  She has not had any episodes of bleeding.  Continue to monitor periodically.  Elevated ferritin likely due to combination of multiple blood transfusions as well as inflammation.  Sacral wound with bleeding This wound developed during hospitalization from 10/28 - 11/12.  General surgery was consulted due to bleeding.  Bleeding has subsided.  General surgery has signed off.  Local wound care.   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 Discussed with Dr. Drucilla Schmidt with infectious disease who recommends consulting plastics to try and get a bone culture.  Discussed with Dr.Dillingham with plastic surgery who will see the patient and consider a bone culture.  Currently off of antibiotics.  Waiting on bone biopsy.  She was noted to be on metronidazole which has been stopped.   Hypothyroidism, newly  diagnosed TSH in 60's, low free t4.  T3 is also low.  Patient was started on low-dose Synthroid due to her recent cardiac issues.  Possible side effect from amiodarone.  MDS (myelodysplastic syndrome) Follows with Dr. Alen Blew.  Transfusion dependent.  He's mentioned having hospice involved in the future and she'd like to defer this as long as possible.    Acute renal failure Creatinine was 2.15 at admission.  She was given IV fluids with improvement.  Creatinine noted to be 1.8 today.  She will be given IV fluids.  Recheck labs tomorrow.  Monitor urine output.  Holding her diuretics.  Avoid nephrotoxic agents.  Essential hypertension Continue metoprolol (transitioned from toprol xl as an outpatient), hydralazine.  Holding lasix and spironolactone.  Hyperlipidemia  Continue statin  Severe Pulm Hypertension Continue sildenafil, currently holding lasix, follow closely.    Moderate AS/MS Not candidate for intervention given [facility]  Atrial Fibrillation Not on anticoagulation given above.  Amiodarone 200 mg BID.  Hyponatremia  Sodium level stable.  Constipation  Still has not had a bowel movement.  Add Senokot.  If there is no response may have to given enema.  Diabetes mellitus type 2 HbA1c 6.3.  No hypoglycemia noted earlier today.  Improved with snacks.  Continue to monitor CBGs.  If she continues to have hypoglycemic episodes we may have to cut back on Lantus dose.   Hypomagnesemia Was repleted yesterday.  We will recheck it tomorrow.  DVT prophylaxis: SCD Code Status: DNR Family Communication: none at bedside Disposition Plan: pending   Consultants:   Surgery  Plastic surgery  Infectious disease  Procedures:  none   Antimicrobials:  Anti-infectives (From admission, onward)   Start     Dose/Rate Route Frequency Ordered Stop   06/09/18 1000  metroNIDAZOLE (FLAGYL) tablet 250 mg     250 mg Oral Daily 06/09/18 0401     06/08/18 1745  clindamycin (CLEOCIN) IVPB  600 mg  Status:  Discontinued     600 mg 100 mL/hr over 30 Minutes Intravenous  Once 06/08/18 1731 06/08/18 1732   06/08/18 1745  vancomycin (VANCOCIN) 1,500 mg in sodium chloride 0.9 % 500 mL IVPB     1,500 mg 250 mL/hr over 120 Minutes Intravenous  Once 06/08/18 1740 06/08/18 2030     Subjective: Patient states that she feels well.  Occasional pain in the lower back at the site of her sacral wound.  Her son is at the bedside.  They had multiple questions about the plan for the osteomyelitis which were answered to their satisfaction.  Objective: Vitals:   06/10/18 1433 06/10/18 2120 06/10/18 2231 06/11/18 0636  BP: (!) 115/42 (!) 145/51 (!) 154/49 (!) 148/54  Pulse: 67 69 62 66  Resp: 16  16 18   Temp: 98.7 F (37.1 C)  98.8 F (37.1 C) 99.1 F (37.3 C)  TempSrc: Oral  Oral Oral  SpO2: 100%  100% 97%  Weight:      Height:        Intake/Output Summary (Last 24 hours) at 06/11/2018 1146 Last data filed at 06/11/2018 1039 Gross per 24 hour  Intake 420 ml  Output 550 ml  Net -130 ml   Filed Weights   06/08/18 1402  Weight: 84.8 kg    Examination:  General exam: Alert.  In no distress Respiratory system: Normal effort at rest.  Clear to auscultation bilaterally Cardiovascular system: S1-S2 is normal regular.  No Q7-Y1 systolic murmur appreciated over the precordium Gastrointestinal system: Abdomen is soft.  Nontender nondistended Central nervous system: No obvious focal neurological deficits Skin: Stage IV sacral decub.  No active bleeding.  Good granulation tissue.    Data Reviewed: I have personally reviewed following labs and imaging studies  CBC: Recent Labs  Lab 06/08/18 1526 06/09/18 0704 06/10/18 0456 06/11/18 0511  WBC 12.0* 9.5 9.9 8.9  NEUTROABS 9.3*  --   --   --   HGB 6.9* 7.7* 9.1* 8.3*  HCT 21.7* 23.4* 27.6* 25.1*  MCV 94.8 94.0 92.0 91.3  PLT 339 289 264 950   Basic Metabolic Panel: Recent Labs  Lab 06/08/18 1526 06/09/18 0704  06/10/18 0456 06/11/18 0511  NA 132* 136 134* 132*  K 4.7 4.3 4.0 3.9  CL 101 105 105 104  CO2 21* 22 20* 19*  GLUCOSE 151* 100* 113* 142*  BUN 60* 58* 48* 52*  CREATININE 2.15* 1.58* 1.30* 1.89*  CALCIUM 9.1 9.2 8.9 8.8*  MG  --  1.4* 1.5*  --   PHOS  --  4.9*  --   --    GFR: Estimated Creatinine Clearance: 32.1 mL/min (A) (by C-G formula based on SCr of 1.89 mg/dL (H)). Liver Function Tests: Recent Labs  Lab 06/09/18 0704 06/10/18 0456  AST 43* 45*  ALT 30 32  ALKPHOS 110 116  BILITOT 2.1* 2.1*  PROT 7.0 6.6  ALBUMIN 2.9* 2.8*   HbA1C: Recent Labs    06/09/18 0704  HGBA1C 6.3*   CBG: Recent Labs  Lab 06/10/18 2017 06/11/18 0028 06/11/18 0112 06/11/18 0422 06/11/18 0744  GLUCAP 259* 69* 117* 149* 146*   Thyroid Function Tests:  Recent Labs    06/09/18 0704  TSH 61.191*  FREET4 0.54*  T3FREE 1.1*   Anemia Panel: Recent Labs    06/08/18 2315  VITAMINB12 853  FOLATE 9.1  FERRITIN 5,614*  TIBC 180*  IRON 147  RETICCTPCT 1.4        Radiology Studies: Mr Pelvis W Wo Contrast  Result Date: 06/09/2018 CLINICAL DATA:  Sacral decubitus ulcer.  Evaluate for osteomyelitis. EXAM: MRI PELVIS WITHOUT AND WITH CONTRAST TECHNIQUE: Multiplanar multisequence MR imaging of the pelvis was performed both before and after administration of intravenous contrast. CONTRAST:  8 mL Gadavist intravenous contrast. COMPARISON:  CT pelvis from yesterday. FINDINGS: Urinary Tract:  No abnormality visualized. Bowel:  Unremarkable visualized pelvic bowel loops. Vascular/Lymphatic: No pathologically enlarged lymph nodes. No significant vascular abnormality seen. Reproductive:  Small uterine fibroids again noted.  No adnexal mass. Other:  None. Musculoskeletal: Midline sacral decubitus ulcer extending to the coccyx. Marrow edema within the coccyx with graying of the posterior cortex and faintly decreased T1 marrow signal. No abscess. Mild presacral soft tissue edema. Mild edema  within the bilateral gluteus maximus muscles adjacent to the sacrum, likely reactive. No fluid collection. IMPRESSION: 1. Midline sacral decubitus ulcer extending to the coccyx with evidence of early coccygeal osteomyelitis. No abscess. Electronically Signed   By: Titus Dubin M.D.   On: 06/09/2018 15:14        Scheduled Meds: . amiodarone  200 mg Oral BID  . atorvastatin  40 mg Oral Daily  . collagenase   Topical BID  . feeding supplement (GLUCERNA SHAKE)  237 mL Oral TID BM  . hydrALAZINE  100 mg Oral TID  . insulin aspart  0-9 Units Subcutaneous Q4H  . insulin glargine  14 Units Subcutaneous QPC breakfast  . latanoprost  1 drop Both Eyes QHS  . levothyroxine  25 mcg Oral Q0600  . metoprolol tartrate  12.5 mg Oral BID  . metroNIDAZOLE  250 mg Oral Daily  . nutrition supplement (JUVEN)  1 packet Oral BID BM  . pantoprazole  40 mg Oral Daily  . polyethylene glycol  17 g Oral BID  . senna  2 tablet Oral QHS  . sildenafil  20 mg Oral TID   Continuous Infusions:    LOS: 3 days    Bonnielee Haff, MD [facility] Pager [phone]   If 7PM-7AM, please contact night-coverage [email or web address] Password [facility] 06/11/2018, 11:46 AM

## 2018-06-11 NOTE — Clinical Social Work Note (Addendum)
Clinical Social Work Assessment  Patient Details  Name: Patricia Mata MRN: [id number] Date of Birth: 11-14-1955  Date of referral:  06/11/18               Reason for consult:  Facility Placement                Permission sought to share information with:  Facility Sport and exercise psychologist, Family Supports Permission granted to share information::  Yes, Verbal Permission Granted  Name::     Architect::  SNF's   Relationship::  Spouse   Contact Information:  [phone]  Housing/Transportation Living arrangements for the past 2 months:  [place] of Information:  Patient, Spouse Patient Interpreter Needed:  None Criminal Activity/Legal Involvement Pertinent to Current Situation/Hospitalization:  No - Comment as needed Significant Relationships:  Spouse Lives with:  Spouse Do you feel safe going back to the place where you live?  No Need for family participation in patient care:  Yes (Comment)  Care giving concerns:   Patient presented  with bleeding from her Sacral wound thatdeveloped after her ICU stay when she was admitted from 10/20 to 11/12.  She was discharged to rehab facility where she has been having blood clots found in the dressings no purulent discharge, no foul smell,noted no chills or fevers.  She requires transfusion of PRBC every 2 to 3 weeks secondary to MDS.  Patient was hospitalized due to severe anemia and bleeding from her sacral wound.   Social Worker assessment / plan:  Discussed discharge plan back to [facility]. Patient will need updated PT/OT notes.  CSW completed FL2.   Plan: SNF  Employment status:  Retired Nurse, adult PT Recommendations:  [name] / Referral to community resources:  [place]  Patient/Family's Response to care: Agreeable and Responding to care.   Patient/Family's Understanding of and Emotional Response to Diagnosis, Current  Treatment, and Prognosis:  Patient has a good understanding of her diagnosis and follow up care.   Emotional Assessment Appearance:  Appears stated age Attitude/Demeanor/Rapport:    Affect (typically observed):  Accepting Orientation:  Oriented to Self, Oriented to Place, Oriented to  Time, Oriented to Situation, Fluctuating Orientation (Suspected and/or reported Sundowners) Alcohol / Substance use:  Not Applicable Psych involvement (Current and /or in the community):  No (Comment)  Discharge Needs  Concerns to be addressed:  Care Coordination, Discharge Planning Concerns Readmission within the last 30 days:  No Current discharge risk:  None Barriers to Discharge:  Continued Medical Work up   [employer], LCSW 06/11/2018, 9:42 AM

## 2018-06-11 NOTE — Evaluation (Signed)
Occupational Therapy Evaluation Patient Details Name: Patricia Mata MRN: [id number] DOB: 1955-10-16 Today's Date: 06/11/2018    History of Present Illness 62 y.o. female was admitted with symptomatic anemia.  PMH:   severe pulmonary hypertension, colon cancer (in remission, no active tx), diabetes, hypertension, anemia, myelodysplastic syndrome,  Sacral wound 9developed after her ICU stay when she was admitted from 10/20 to 11/12)    Clinical Impression   Pt was admitted for the above. She was recently admitted to rehab following last hospitalization and had assistance for ADLs and ambulating with RW.  Pt is very motivated to regain strength and especially wants to walk. Will follow in acute setting with supervision level goals.    Follow Up Recommendations  SNF    Equipment Recommendations  3 in 1 bedside commode    Recommendations for Other Services       Precautions / Restrictions Precautions Precautions: Fall Precaution Comments: Sacral wound; pt denies falls in the past 1 year Restrictions Weight Bearing Restrictions: No      Mobility Bed Mobility Overal bed mobility: Modified Independent             General bed mobility comments: use of bedrail and extra time  Transfers   Equipment used: Rolling walker (2 wheeled)   Sit to Stand: Min guard              Balance     Sitting balance-Leahy Scale: Good       Standing balance-Leahy Scale: Poor                             ADL either performed or assessed with clinical judgement   ADL   Eating/Feeding: Independent   Grooming: Wash/dry hands;Min guard;Standing   Upper Body Bathing: Set up   Lower Body Bathing: Minimal assistance;Sit to/from stand   Upper Body Dressing : Set up   Lower Body Dressing: Maximal assistance;Sit to/from stand   Toilet Transfer: Min guard;Ambulation;BSC;RW Toilet Transfer Details (indicate cue type and reason): mod A hygiene           General ADL  Comments: pt wearing adult incontinence brief from rehab; assist to manage this due to tabs     Vision         Perception     Praxis      Pertinent Vitals/Pain Pain Assessment: Faces Faces Pain Scale: Hurts even more Pain Location: sacral wound Pain Descriptors / Indicators: Grimacing;Discomfort Pain Intervention(s): Limited activity within patient's tolerance;Monitored during session;Repositioned     Hand Dominance     Extremity/Trunk Assessment Upper Extremity Assessment Upper Extremity Assessment: Overall WFL for tasks assessed           Communication Communication Communication: No difficulties   Cognition Arousal/Alertness: Awake/alert Behavior During Therapy: WFL for tasks assessed/performed Overall Cognitive Status: Within Functional Limits for tasks assessed                                     General Comments  pt reports she sat up in chair for a couple of hours yesterday. Asked [place] to order a geomat and encouraged pt to sit for shorter duration, but more frequently, if desired    Exercises     Shoulder Instructions      Home Living Family/patient expects to be discharged to:: Skilled nursing facility  Additional Comments: was at SNF for rehab      Prior Functioning/Environment    Gait / Transfers Assistance Needed: walks with RW and assistance at SNF ADL's / Homemaking Assistance Needed: bathed with min assist             OT Problem List: Decreased strength;Decreased activity tolerance;Pain;Decreased knowledge of use of DME or AE;Impaired balance (sitting and/or standing)      OT Treatment/Interventions: Self-care/ADL training;DME and/or AE instruction;Energy conservation;Balance training;Patient/family education;Therapeutic activities    OT Goals(Current goals can be found in the care plan section) Acute Rehab OT Goals Patient Stated Goal: to walk OT Goal Formulation: With  patient Time For Goal Achievement: 06/25/18 Potential to Achieve Goals: Good ADL Goals Pt Will Transfer to Toilet: with supervision;ambulating;bedside commode(and perform hygiene/clothes management at supervision level) Additional ADL Goal #1: pt will perform adl with AE at supervision level  OT Frequency: Min 2X/week   Barriers to D/C:            Co-evaluation              AM-PAC OT "6 Clicks" Daily Activity     Outcome Measure Help from another person eating meals?: None Help from another person taking care of personal grooming?: A Little Help from another person toileting, which includes using toliet, bedpan, or urinal?: A Lot Help from another person bathing (including washing, rinsing, drying)?: A Little Help from another person to put on and taking off regular upper body clothing?: A Little Help from another person to put on and taking off regular lower body clothing?: A Lot 6 Click Score: 17   End of Session    Activity Tolerance: Patient tolerated treatment well Patient left: with call bell/phone within reach;in bed;with bed alarm set  OT Visit Diagnosis: Unsteadiness on feet (R26.81);Muscle weakness (generalized) (M62.81)                Time: 4037-0964 OT Time Calculation (min): 23 min Charges:  OT General Charges $OT Visit: 1 Visit OT Evaluation $OT Eval Low Complexity: [place], OTR/L Acute Rehabilitation Services [phone] [facility] pager [phone] office 06/11/2018  [place] 06/11/2018, 10:07 AM

## 2018-06-12 ENCOUNTER — Encounter (HOSPITAL_COMMUNITY)
Admission: EM | Disposition: A | Discharge status: Skilled Nursing Facility | Payer: Self-pay | Source: Home / Self Care | Attending: Internal Medicine

## 2018-06-12 ENCOUNTER — Encounter (HOSPITAL_COMMUNITY): Payer: Self-pay

## 2018-06-12 ENCOUNTER — Inpatient Hospital Stay (HOSPITAL_COMMUNITY): Payer: BC Managed Care – PPO | Admitting: Anesthesiology

## 2018-06-12 ENCOUNTER — Inpatient Hospital Stay: Payer: Self-pay

## 2018-06-12 DIAGNOSIS — Z7289 Other problems related to lifestyle: Secondary | ICD-10-CM

## 2018-06-12 DIAGNOSIS — N181 Chronic kidney disease, stage 1: Secondary | ICD-10-CM

## 2018-06-12 DIAGNOSIS — D631 Anemia in chronic kidney disease: Secondary | ICD-10-CM

## 2018-06-12 DIAGNOSIS — L89159 Pressure ulcer of sacral region, unspecified stage: Secondary | ICD-10-CM

## 2018-06-12 DIAGNOSIS — L89154 Pressure ulcer of sacral region, stage 4: Secondary | ICD-10-CM

## 2018-06-12 HISTORY — PX: DEBRIDMENT OF DECUBITUS ULCER: SHX6276

## 2018-06-12 LAB — BASIC METABOLIC PANEL
Anion gap: 9 (ref 5–15)
BUN: 56 mg/dL — ABNORMAL HIGH (ref 8–23)
CO2: 19 mmol/L — ABNORMAL LOW (ref 22–32)
Calcium: 9 mg/dL (ref 8.9–10.3)
Chloride: 105 mmol/L (ref 98–111)
Creatinine, Ser: 1.24 mg/dL — ABNORMAL HIGH (ref 0.44–1.00)
GFR calc Af Amer: 54 mL/min — ABNORMAL LOW (ref >60)
GFR calc non Af Amer: 46 mL/min — ABNORMAL LOW (ref >60)
Glucose, Bld: 132 mg/dL — ABNORMAL HIGH (ref 70–99)
Potassium: 3.9 mmol/L (ref 3.5–5.1)
Sodium: 133 mmol/L — ABNORMAL LOW (ref 135–145)

## 2018-06-12 LAB — GLUCOSE, CAPILLARY
Glucose-Capillary: 117 mg/dL — ABNORMAL HIGH (ref 70–99)
Glucose-Capillary: 123 mg/dL — ABNORMAL HIGH (ref 70–99)
Glucose-Capillary: 140 mg/dL — ABNORMAL HIGH (ref 70–99)
Glucose-Capillary: 142 mg/dL — ABNORMAL HIGH (ref 70–99)
Glucose-Capillary: 143 mg/dL — ABNORMAL HIGH (ref 70–99)
Glucose-Capillary: 150 mg/dL — ABNORMAL HIGH (ref 70–99)

## 2018-06-12 LAB — CBC
HCT: 24.6 % — ABNORMAL LOW (ref 36.0–46.0)
Hemoglobin: 7.9 g/dL — ABNORMAL LOW (ref 12.0–15.0)
MCH: 29.8 pg (ref 26.0–34.0)
MCHC: 32.1 g/dL (ref 30.0–36.0)
MCV: 92.8 fL (ref 80.0–100.0)
Platelets: 258 10*3/uL (ref 150–400)
RBC: 2.65 MIL/uL — ABNORMAL LOW (ref 3.87–5.11)
RDW: 16.6 % — ABNORMAL HIGH (ref 11.5–15.5)
WBC: 9.1 10*3/uL (ref 4.0–10.5)
nRBC: 0 % (ref 0.0–0.2)

## 2018-06-12 LAB — HIV ANTIBODY (ROUTINE TESTING W REFLEX): HIV Screen 4th Generation wRfx: NONREACTIVE

## 2018-06-12 LAB — MRSA PCR SCREENING: MRSA by PCR: NEGATIVE

## 2018-06-12 LAB — MAGNESIUM: Magnesium: 1.2 mg/dL — ABNORMAL LOW (ref 1.7–2.4)

## 2018-06-12 SURGERY — DEBRIDMENT OF DECUBITUS ULCER
Anesthesia: General

## 2018-06-12 MED ORDER — LIDOCAINE 2% (20 MG/ML) 5 ML SYRINGE
INTRAMUSCULAR | Status: AC
  Filled 2018-06-12: qty 5

## 2018-06-12 MED ORDER — CHLORHEXIDINE GLUCONATE CLOTH 2 % EX PADS
6.0000 | MEDICATED_PAD | Freq: Once | CUTANEOUS | Status: AC
  Administered 2018-06-12: 6 via TOPICAL

## 2018-06-12 MED ORDER — PROPOFOL 10 MG/ML IV BOLUS
INTRAVENOUS | Status: AC
  Filled 2018-06-12: qty 20

## 2018-06-12 MED ORDER — FENTANYL CITRATE (PF) 100 MCG/2ML IJ SOLN
INTRAMUSCULAR | Status: DC | PRN
  Administered 2018-06-12 (×4): 25 ug via INTRAVENOUS

## 2018-06-12 MED ORDER — PHENYLEPHRINE HCL 10 MG/ML IJ SOLN
INTRAMUSCULAR | Status: AC
  Filled 2018-06-12: qty 2

## 2018-06-12 MED ORDER — LIDOCAINE 2% (20 MG/ML) 5 ML SYRINGE
INTRAMUSCULAR | Status: DC | PRN
  Administered 2018-06-12: 60 mg via INTRAVENOUS

## 2018-06-12 MED ORDER — BUPIVACAINE-EPINEPHRINE 0.25% -1:200000 IJ SOLN
INTRAMUSCULAR | Status: DC | PRN
  Administered 2018-06-12: 20 mL

## 2018-06-12 MED ORDER — ONDANSETRON HCL 4 MG/2ML IJ SOLN
INTRAMUSCULAR | Status: DC | PRN
  Administered 2018-06-12: 4 mg via INTRAVENOUS

## 2018-06-12 MED ORDER — SODIUM CHLORIDE 0.9 % IV SOLN
INTRAVENOUS | Status: DC | PRN
  Administered 2018-06-12: 500 mL

## 2018-06-12 MED ORDER — MAGNESIUM SULFATE 4 GM/100ML IV SOLN
4.0000 g | Freq: Once | INTRAVENOUS | Status: DC
  Filled 2018-06-12: qty 100

## 2018-06-12 MED ORDER — LACTATED RINGERS IV SOLN
INTRAVENOUS | Status: DC
  Administered 2018-06-12: 08:00:00 via INTRAVENOUS

## 2018-06-12 MED ORDER — METRONIDAZOLE 500 MG PO TABS: 500.0000 mg | ORAL_TABLET | Freq: Three times a day (TID) | ORAL | Status: DC

## 2018-06-12 MED ORDER — ONDANSETRON HCL 4 MG/2ML IJ SOLN
INTRAMUSCULAR | Status: AC
  Filled 2018-06-12: qty 2

## 2018-06-12 MED ORDER — CHLORHEXIDINE GLUCONATE CLOTH 2 % EX PADS: 6.0000 | MEDICATED_PAD | Freq: Once | CUTANEOUS | Status: DC

## 2018-06-12 MED ORDER — VANCOMYCIN HCL IN DEXTROSE 750-5 MG/150ML-% IV SOLN: 750.0000 mg | INTRAVENOUS | Status: DC

## 2018-06-12 MED ORDER — BUPIVACAINE-EPINEPHRINE (PF) 0.25% -1:200000 IJ SOLN
INTRAMUSCULAR | Status: AC
  Filled 2018-06-12: qty 30

## 2018-06-12 MED ORDER — PHENYLEPHRINE 40 MCG/ML (10ML) SYRINGE FOR IV PUSH (FOR BLOOD PRESSURE SUPPORT)
PREFILLED_SYRINGE | INTRAVENOUS | Status: DC | PRN
  Administered 2018-06-12: 120 ug via INTRAVENOUS
  Administered 2018-06-12: 80 ug via INTRAVENOUS

## 2018-06-12 MED ORDER — VANCOMYCIN HCL 10 G IV SOLR
1500.0000 mg | Freq: Once | INTRAVENOUS | Status: AC
  Administered 2018-06-12: 1500 mg via INTRAVENOUS
  Filled 2018-06-12: qty 1500

## 2018-06-12 MED ORDER — HYDROMORPHONE HCL 1 MG/ML IJ SOLN
0.2500 mg | INTRAMUSCULAR | Status: DC | PRN
  Administered 2018-06-12 (×3): 0.5 mg via INTRAVENOUS

## 2018-06-12 MED ORDER — FENTANYL CITRATE (PF) 100 MCG/2ML IJ SOLN
INTRAMUSCULAR | Status: AC
  Filled 2018-06-12: qty 2

## 2018-06-12 MED ORDER — 0.9 % SODIUM CHLORIDE (POUR BTL) OPTIME
TOPICAL | Status: DC | PRN
  Administered 2018-06-12: 1000 mL

## 2018-06-12 MED ORDER — SODIUM CHLORIDE 0.9 % IV SOLN
1.0000 g | Freq: Two times a day (BID) | INTRAVENOUS | Status: DC
  Administered 2018-06-12 – 2018-06-13 (×2): 1 g via INTRAVENOUS
  Filled 2018-06-12 (×2): qty 1

## 2018-06-12 MED ORDER — PROPOFOL 10 MG/ML IV BOLUS
INTRAVENOUS | Status: DC | PRN
  Administered 2018-06-12: 30 mg via INTRAVENOUS
  Administered 2018-06-12: 10 mg via INTRAVENOUS
  Administered 2018-06-12: 20 mg via INTRAVENOUS
  Administered 2018-06-12: 10 mg via INTRAVENOUS
  Administered 2018-06-12: 20 mg via INTRAVENOUS

## 2018-06-12 MED ORDER — HYDROMORPHONE HCL 1 MG/ML IJ SOLN
INTRAMUSCULAR | Status: AC
  Filled 2018-06-12: qty 2

## 2018-06-12 MED ORDER — LACTATED RINGERS IV SOLN
INTRAVENOUS | Status: DC
  Administered 2018-06-12 – 2018-06-14 (×3): via INTRAVENOUS

## 2018-06-12 SURGICAL SUPPLY — 35 items
BAG DECANTER FOR FLEXI CONT (MISCELLANEOUS) ×1 IMPLANT
BLADE HEX COATED 2.75 (ELECTRODE) ×2 IMPLANT
BNDG GAUZE ELAST 4 BULKY (GAUZE/BANDAGES/DRESSINGS) ×2 IMPLANT
CORD BIPOLAR FORCEPS 12FT (ELECTRODE) IMPLANT
COVER SURGICAL LIGHT HANDLE (MISCELLANEOUS) ×2 IMPLANT
COVER WAND RF STERILE (DRAPES) IMPLANT
DRAIN PENROSE 18X1/4 LTX STRL (WOUND CARE) IMPLANT
DRAPE LAPAROSCOPIC ABDOMINAL (DRAPES) ×1 IMPLANT
DRAPE ORTHO SPLIT 77X108 STRL (DRAPES)
DRAPE SHEET LG 3/4 BI-LAMINATE (DRAPES) IMPLANT
DRAPE STERI IOBAN 125X83 (DRAPES) IMPLANT
DRAPE SURG ORHT 6 SPLT 77X108 (DRAPES) IMPLANT
DRSG EMULSION OIL 3X16 NADH (GAUZE/BANDAGES/DRESSINGS) IMPLANT
DRSG PAD ABDOMINAL 8X10 ST (GAUZE/BANDAGES/DRESSINGS) ×3 IMPLANT
DURAPREP 26ML APPLICATOR (WOUND CARE) IMPLANT
ELECT REM PT RETURN 15FT ADLT (MISCELLANEOUS) ×1 IMPLANT
GAUZE SPONGE 4X4 12PLY STRL (GAUZE/BANDAGES/DRESSINGS) IMPLANT
GLOVE BIO SURGEON STRL SZ 6.5 (GLOVE) ×2 IMPLANT
GOWN STRL REUS W/TWL LRG LVL3 (GOWN DISPOSABLE) ×2 IMPLANT
HANDPIECE INTERPULSE COAX TIP (DISPOSABLE)
KIT BASIN OR (CUSTOM PROCEDURE TRAY) ×2 IMPLANT
MANIFOLD NEPTUNE II (INSTRUMENTS) IMPLANT
NEEDLE HYPO 22GX1.5 SAFETY (NEEDLE) ×1 IMPLANT
PACK ORTHO EXTREMITY (CUSTOM PROCEDURE TRAY) ×2 IMPLANT
SET HNDPC FAN SPRY TIP SCT (DISPOSABLE) IMPLANT
SPONGE SURGIFOAM ABS GEL 12-7 (HEMOSTASIS) ×1 IMPLANT
STAPLER VISISTAT 35W (STAPLE) ×1 IMPLANT
STOCKINETTE 8 INCH (MISCELLANEOUS) IMPLANT
SUT ETHILON 2 0 PS N (SUTURE) IMPLANT
SUT VIC AB 5-0 PS2 18 (SUTURE) IMPLANT
SWAB COLLECTION DEVICE MRSA (MISCELLANEOUS) IMPLANT
SWAB CULTURE ESWAB REG 1ML (MISCELLANEOUS) IMPLANT
SYR CONTROL 10ML LL (SYRINGE) ×1 IMPLANT
TOWEL OR 17X26 10 PK STRL BLUE (TOWEL DISPOSABLE) ×3 IMPLANT
TOWEL OR NON WOVEN STRL DISP B (DISPOSABLE) ×2 IMPLANT

## 2018-06-12 NOTE — H&P (Signed)
Patricia Mata is an 62 y.o. female.   Chief Complaint: Sacral ulcer HPI: The patient is a 62 yrs old bf here with family for treatment of a sacral ulcer.  She has a long history of poor health including DM, HTN, anemia and myelodysplastic syndrome.  She had been in the ICU one month ago and then was at a rehabilitation facility when she was found to have bleeding from the sacral ulcer.  She was receiving hydrotherapy.  Her anemia worsened although she has a history of it and has gotten transfusions for it before the bleeding episode.  The bone at the base is exposed and ID would like to have Gm stain C and S for direction on antibiotic use.  Area of breakdown is ~ 10 x 10 and then 2 x 2 for the full thickness wound.      Past Medical History:  Diagnosis Date  . Cancer [facility]) 2008   Colon   . Diabetes mellitus without complication ([place])   . Hypertension   . Macrocytic anemia     Past Surgical History:  Procedure Laterality Date  . COLON SURGERY    . IR FLUORO GUIDE PORT INSERTION RIGHT  04/02/2017  . IR US GUIDE VASC ACCESS RIGHT  04/02/2017  . RIGHT/LEFT HEART CATH AND CORONARY ANGIOGRAPHY N/A 05/21/2018   Procedure: RIGHT/LEFT HEART CATH AND CORONARY ANGIOGRAPHY;  Surgeon: Nelva Bush, MD;  Location: [place] [facility];  Service: Cardiovascular;  Laterality: N/A;    Family History  Problem Relation Age of Onset  . Hypertension Mother   . Heart attack Father   . Hypertension Sister   . Hypertension Brother   . Hypertension Sister   . Hypertension Sister   . Prostate cancer Brother   . HIV/AIDS Brother    Social History:  reports that she quit smoking about 17 years ago. Her smoking use included cigarettes. She has quit using smokeless tobacco. She reports that she drinks about 2.0 standard drinks of alcohol per week. She reports that she does not use drugs.  Allergies:  Allergies  Allergen Reactions  . Ancef [Cefazolin] Itching    Severe itching- after procedure, ancef was the  antibiotic.-04/02/17  . Sulfa Antibiotics Rash and Other (See Comments)    Blisters, also    Medications Prior to Admission  Medication Sig Dispense Refill  . amiodarone (PACERONE) 200 MG tablet Take 1 tablet (200 mg total) by mouth 2 (two) times daily. 60 tablet 3  . atorvastatin (LIPITOR) 40 MG tablet Take 1 tablet (40 mg total) by mouth daily. 90 tablet 0  . collagenase (SANTYL) ointment Apply topically daily. 15 g 0  . furosemide (LASIX) 40 MG tablet Take 1 tablet (40 mg total) by mouth 2 (two) times daily. 60 tablet 5  . hydrALAZINE (APRESOLINE) 100 MG tablet Take 1 tablet (100 mg total) by mouth 3 (three) times daily. 90 tablet 6  . insulin glargine (LANTUS) 100 unit/mL SOPN Inject 0.14 mLs (14 Units total) into the skin daily after breakfast. 15 mL 11  . JANUMET XR 50-500 MG TB24 Take 1 tablet by mouth daily. With evening meal  2  . latanoprost (XALATAN) 0.005 % ophthalmic solution Place 1 drop into both eyes at bedtime.     . metoprolol succinate (TOPROL-XL) 25 MG 24 hr tablet Take 1 tablet (25 mg total) by mouth 2 (two) times daily. 30 tablet 5  . metroNIDAZOLE (FLAGYL) 250 MG tablet Take 250 mg by mouth daily.    Marland Kitchen  Multiple Vitamins-Minerals (CENTRUM SILVER 50+WOMEN) TABS Take 1 tablet by mouth daily.    . mupirocin ointment (BACTROBAN) 2 % Place 1 application into the nose daily.    Marland Kitchen oxycodone-acetaminophen (PERCOCET) 2.5-325 MG tablet Take 1 tablet by mouth daily as needed for pain.    . pantoprazole (PROTONIX) 40 MG tablet Take 1 tablet (40 mg total) by mouth daily. 30 tablet 5  . sildenafil (REVATIO) 20 MG tablet Take 1 tablet (20 mg total) by mouth 3 (three) times daily. 90 tablet 5  . sodium hypochlorite (DAKIN'S 1/2 STRENGTH) external solution Irrigate with 1 application as directed daily.    Marland Kitchen spironolactone (ALDACTONE) 25 MG tablet Take 0.5 tablets (12.5 mg total) by mouth daily. 15 tablet 5  . traMADol (ULTRAM) 50 MG tablet Take 50 mg by mouth daily.    . feeding  supplement, GLUCERNA SHAKE, (GLUCERNA SHAKE) LIQD Take 237 mLs by mouth 3 (three) times daily between meals. (Patient not taking: Reported on 06/08/2018) 90 Can 0  . Hydrocortisone (GERHARDT'S BUTT CREAM) CREA Apply 1 application topically 2 (two) times daily. (Patient not taking: Reported on 06/08/2018) 1 each 5  . Insulin Pen Needle 29G X 5MM MISC Use as directed 200 each 0  . lidocaine-prilocaine (EMLA) cream Apply a quarter size of cream to port 1-2 hours prior to access. Cover with saran wrap. (Patient not taking: Reported on 06/08/2018) 30 g 1  . potassium chloride SA (K-DUR,KLOR-CON) 20 MEQ tablet Take 1 tablet (20 mEq total) by mouth 2 (two) times daily. (Patient not taking: Reported on 06/08/2018) 60 tablet 5    Results for orders placed or performed during the hospital encounter of 06/08/18 (from the past 48 hour(s))  Glucose, capillary     Status: Abnormal   Collection Time: 06/10/18 12:15 PM  Result Value Ref Range   Glucose-Capillary 154 (H) 70 - 99 mg/dL  Glucose, capillary     Status: Abnormal   Collection Time: 06/10/18  8:17 PM  Result Value Ref Range   Glucose-Capillary 259 (H) 70 - 99 mg/dL  Glucose, capillary     Status: Abnormal   Collection Time: 06/11/18 12:28 AM  Result Value Ref Range   Glucose-Capillary 69 (L) 70 - 99 mg/dL  Glucose, capillary     Status: Abnormal   Collection Time: 06/11/18  1:12 AM  Result Value Ref Range   Glucose-Capillary 117 (H) 70 - 99 mg/dL   Comment 1 Notify RN   Glucose, capillary     Status: Abnormal   Collection Time: 06/11/18  4:22 AM  Result Value Ref Range   Glucose-Capillary 149 (H) 70 - 99 mg/dL   Comment 1 Notify RN   CBC     Status: Abnormal   Collection Time: 06/11/18  5:11 AM  Result Value Ref Range   WBC 8.9 4.0 - 10.5 K/uL   RBC 2.75 (L) 3.87 - 5.11 MIL/uL   Hemoglobin 8.3 (L) 12.0 - 15.0 g/dL   HCT 25.1 (L) 36.0 - 46.0 %   MCV 91.3 80.0 - 100.0 fL   MCH 30.2 26.0 - 34.0 pg   MCHC 33.1 30.0 - 36.0 g/dL   RDW  16.8 (H) 11.5 - 15.5 %   Platelets 254 150 - 400 K/uL   nRBC 0.0 0.0 - 0.2 %    Comment: Performed at [facility], [place] [address] [place], [place] [address]  Basic metabolic panel     Status: Abnormal   Collection Time: 06/11/18  5:11 AM  Result Value Ref Range   Sodium 132 (L) 135 - 145 mmol/L   Potassium 3.9 3.5 - 5.1 mmol/L   Chloride 104 98 - 111 mmol/L   CO2 19 (L) 22 - 32 mmol/L   Glucose, Bld 142 (H) 70 - 99 mg/dL   BUN 52 (H) 8 - 23 mg/dL   Creatinine, Ser 1.89 (H) 0.44 - 1.00 mg/dL   Calcium 8.8 (L) 8.9 - 10.3 mg/dL   GFR calc non Af Amer 28 (L) >60 mL/min   GFR calc Af Amer 32 (L) >60 mL/min   Anion gap 9 5 - 15    Comment: Performed at [facility], [place] [address], [place], [place] [address]  Sedimentation rate     Status: Abnormal   Collection Time: 06/11/18  5:11 AM  Result Value Ref Range   Sed Rate 68 (H) 0 - 22 mm/hr    Comment: Performed at [facility], [place] [address] [place], [place] [address]  C-reactive protein     Status: Abnormal   Collection Time: 06/11/18  5:11 AM  Result Value Ref Range   CRP 3.5 (H) <1.0 mg/dL    Comment: Performed at [facility], [place] [address] [place], [place] [address]  Glucose, capillary     Status: Abnormal   Collection Time: 06/11/18  7:44 AM  Result Value Ref Range   Glucose-Capillary 146 (H) 70 - 99 mg/dL  Glucose, capillary     Status: Abnormal   Collection Time: 06/11/18 11:59 AM  Result Value Ref Range   Glucose-Capillary 228 (H) 70 - 99 mg/dL  Glucose, capillary     Status: Abnormal   Collection Time: 06/11/18  4:16 PM  Result Value Ref Range   Glucose-Capillary 170 (H) 70 - 99 mg/dL  Glucose, capillary     Status: Abnormal   Collection Time: 06/11/18  8:14 PM  Result Value Ref Range   Glucose-Capillary 229 (H) 70 - 99 mg/dL   Comment 1 Notify RN   Glucose, capillary     Status: Abnormal   Collection Time: 06/12/18 12:14 AM  Result  Value Ref Range   Glucose-Capillary 150 (H) 70 - 99 mg/dL   Comment 1 Notify RN   MRSA PCR Screening     Status: None   Collection Time: 06/12/18  3:22 AM  Result Value Ref Range   MRSA by PCR NEGATIVE NEGATIVE    Comment:        The GeneXpert MRSA Assay (FDA approved for NASAL specimens only), is one component of a comprehensive MRSA colonization surveillance program. It is not intended to diagnose MRSA infection nor to guide or monitor treatment for MRSA infections. Performed at [facility], [place] [address] [place], [place] [address]   Glucose, capillary     Status: Abnormal   Collection Time: 06/12/18  4:02 AM  Result Value Ref Range   Glucose-Capillary 143 (H) 70 - 99 mg/dL   Comment 1 Notify RN   CBC     Status: Abnormal   Collection Time: 06/12/18  5:04 AM  Result Value Ref Range   WBC 9.1 4.0 - 10.5 K/uL   RBC 2.65 (L) 3.87 - 5.11 MIL/uL   Hemoglobin 7.9 (L) 12.0 - 15.0 g/dL   HCT 24.6 (L) 36.0 - 46.0 %   MCV 92.8 80.0 - 100.0 fL   MCH 29.8 26.0 - 34.0 pg   MCHC 32.1 30.0 - 36.0 g/dL   RDW 16.6 (H)  11.5 - 15.5 %   Platelets 258 150 - 400 K/uL   nRBC 0.0 0.0 - 0.2 %    Comment: Performed at [facility], [place] [address], [place], [place] [address]  Basic metabolic panel     Status: Abnormal   Collection Time: 06/12/18  5:04 AM  Result Value Ref Range   Sodium 133 (L) 135 - 145 mmol/L   Potassium 3.9 3.5 - 5.1 mmol/L   Chloride 105 98 - 111 mmol/L   CO2 19 (L) 22 - 32 mmol/L   Glucose, Bld 132 (H) 70 - 99 mg/dL   BUN 56 (H) 8 - 23 mg/dL   Creatinine, Ser 1.24 (H) 0.44 - 1.00 mg/dL   Calcium 9.0 8.9 - 10.3 mg/dL   GFR calc non Af Amer 46 (L) >60 mL/min   GFR calc Af Amer 54 (L) >60 mL/min   Anion gap 9 5 - 15    Comment: Performed at [facility], [place] [address], [place], [place] [address]  Magnesium     Status: Abnormal   Collection Time: 06/12/18  5:04 AM  Result Value Ref Range   Magnesium 1.2 (L)  1.7 - 2.4 mg/dL    Comment: Performed at [facility], [place] [address] [place],  [address]   No results found.  Review of Systems  Unable to perform ROS: Acuity of condition  Constitutional: Positive for malaise/fatigue.  HENT: Negative.   Eyes: Negative.   Respiratory: Negative.   Gastrointestinal: Negative.   Genitourinary: Negative.   Musculoskeletal: Negative.   Skin: Negative.   Psychiatric/Behavioral: Negative.     Blood pressure (!) 177/50, pulse 61, temperature 98.2 F (36.8 C), temperature source Oral, resp. rate 15, height 5\' 4"  (1.626 m), weight 84.8 kg, SpO2 100 %. Physical Exam  Constitutional: She is oriented to person, place, and time. She appears well-developed.  HENT:  Head: Normocephalic and atraumatic.  Eyes: Pupils are equal, round, and reactive to light. EOM are normal.  Cardiovascular: Normal rate.  Respiratory: Effort normal.  GI: Soft. She exhibits no distension.  Musculoskeletal:       Back:  Neurological: She is alert and oriented to person, place, and time.  Psychiatric: She has a normal mood and affect. Her behavior is normal.     Assessment/Plan Plan for bone biopsy of sacral site for gram stain culture and sensitivity for to direct ID on antibiotic usage.  Patient and family seen and agree with the plan.  Possible Acell placement.  [place], DO 06/12/2018, 10:15 AM

## 2018-06-12 NOTE — Progress Notes (Signed)
PT Cancellation Note  Patient Details Name: Patricia Mata MRN: [id number] DOB: 05-22-1956   Cancelled Treatment:    Reason Eval/Treat Not Completed: Patient at procedure or test/unavailable   [facility] 06/12/2018, 9:25 AM

## 2018-06-12 NOTE — Progress Notes (Signed)
Pharmacy Antibiotic Note  Patricia Mata is a 62 y.o. female admitted on 06/08/2018 with medical history significant of severe pulmonary hypertension,colon cancer(in remission, no active tx), diabetes, hypertension, anemia,myelodysplastic syndrome,severe AS who presented with bleeding from her sacral wound.  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  Plan: Vancomycin 1500mg  IV x 1 then 750mg  IV q24h (AUC 458.4, Scr 1.24) merrem 1gm IV q12h  Follow renal function, cultures and clinical course  Height: 5\' 4"  (162.6 cm) Weight: 187 lb (84.8 kg) IBW/kg (Calculated) : 54.7  Temp (24hrs), Avg:98.5 F (36.9 C), Min:98 F (36.7 C), Max:99.3 F (37.4 C)  Recent Labs  Lab 06/08/18 1526 06/09/18 0704 06/10/18 0456 06/11/18 0511 06/12/18 0504  WBC 12.0* 9.5 9.9 8.9 9.1  CREATININE 2.15* 1.58* 1.30* 1.89* 1.24*    Estimated Creatinine Clearance: 48.9 mL/min (A) (by C-G formula based on SCr of 1.24 mg/dL (H)).    Allergies  Allergen Reactions  . Ancef [Cefazolin] Itching    Severe itching- after procedure, ancef was the antibiotic.-04/02/17  . Sulfa Antibiotics Rash and Other (See Comments)    Blisters, also    Antimicrobials this admission: 11/29 vanc >> 11/29 merrema >> Dose adjustments this admission:   Microbiology results: 11/29 wound : 11/29 bone : 11/29 MRSA PCR: negative  Thank you for allowing pharmacy to be a part of this patient's care.  Dolly Rias RPh 06/12/2018, 5:18 PM Pager [phone]

## 2018-06-12 NOTE — Consult Note (Signed)
Reason for Consult: sacral ulcer Referring Physician: Bonnielee Haff, MD  Patricia Mata is an 62 y.o. female.  HPI: The patient is a 62 yrs old bf here for evaluation and treatment of a sacral ulcer.  She has multiple medical conditions including anemia, DM, HTN, myelodysplastic syndrome, kidney disease, atrial fibrillation and carotid stenosis.  She had a prolonged hospitalization and ICU stay and then discharge to rehab facility.  At some point she developed a sacral ulcer.  She was brought to the ED for bleeding from the area.  She has bone exposed at the base of the wound and a CT concerning for osteo.     Past Medical History:  Diagnosis Date  . Cancer [facility]) 2008   Colon   . Diabetes mellitus without complication ([place])   . Hypertension   . Macrocytic anemia     Past Surgical History:  Procedure Laterality Date  . COLON SURGERY    . IR FLUORO GUIDE PORT INSERTION RIGHT  04/02/2017  . IR US GUIDE VASC ACCESS RIGHT  04/02/2017  . RIGHT/LEFT HEART CATH AND CORONARY ANGIOGRAPHY N/A 05/21/2018   Procedure: RIGHT/LEFT HEART CATH AND CORONARY ANGIOGRAPHY;  Surgeon: Nelva Bush, MD;  Location: [facility];  Service: Cardiovascular;  Laterality: N/A;    Family History  Problem Relation Age of Onset  . Hypertension Mother   . Heart attack Father   . Hypertension Sister   . Hypertension Brother   . Hypertension Sister   . Hypertension Sister   . Prostate cancer Brother   . HIV/AIDS Brother     Social History:  reports that she quit smoking about 17 years ago. Her smoking use included cigarettes. She has quit using smokeless tobacco. She reports that she drinks about 2.0 standard drinks of alcohol per week. She reports that she does not use drugs.  Allergies:  Allergies  Allergen Reactions  . Ancef [Cefazolin] Itching    Severe itching- after procedure, ancef was the antibiotic.-04/02/17  . Sulfa Antibiotics Rash and Other (See Comments)    Blisters, also     Medications: I have reviewed the patient's current medications.  Results for orders placed or performed during the hospital encounter of 06/08/18 (from the past 48 hour(s))  Glucose, capillary     Status: Abnormal   Collection Time: 06/10/18 12:15 PM  Result Value Ref Range   Glucose-Capillary 154 (H) 70 - 99 mg/dL  Glucose, capillary     Status: Abnormal   Collection Time: 06/10/18  8:17 PM  Result Value Ref Range   Glucose-Capillary 259 (H) 70 - 99 mg/dL  Glucose, capillary     Status: Abnormal   Collection Time: 06/11/18 12:28 AM  Result Value Ref Range   Glucose-Capillary 69 (L) 70 - 99 mg/dL  Glucose, capillary     Status: Abnormal   Collection Time: 06/11/18  1:12 AM  Result Value Ref Range   Glucose-Capillary 117 (H) 70 - 99 mg/dL   Comment 1 Notify RN   Glucose, capillary     Status: Abnormal   Collection Time: 06/11/18  4:22 AM  Result Value Ref Range   Glucose-Capillary 149 (H) 70 - 99 mg/dL   Comment 1 Notify RN   CBC     Status: Abnormal   Collection Time: 06/11/18  5:11 AM  Result Value Ref Range   WBC 8.9 4.0 - 10.5 K/uL   RBC 2.75 (L) 3.87 - 5.11 MIL/uL   Hemoglobin 8.3 (L) 12.0 - 15.0 g/dL  HCT 25.1 (L) 36.0 - 46.0 %   MCV 91.3 80.0 - 100.0 fL   MCH 30.2 26.0 - 34.0 pg   MCHC 33.1 30.0 - 36.0 g/dL   RDW 16.8 (H) 11.5 - 15.5 %   Platelets 254 150 - 400 K/uL   nRBC 0.0 0.0 - 0.2 %    Comment: Performed at [facility], [place] [address] [place], [place] [address]  Basic metabolic panel     Status: Abnormal   Collection Time: 06/11/18  5:11 AM  Result Value Ref Range   Sodium 132 (L) 135 - 145 mmol/L   Potassium 3.9 3.5 - 5.1 mmol/L   Chloride 104 98 - 111 mmol/L   CO2 19 (L) 22 - 32 mmol/L   Glucose, Bld 142 (H) 70 - 99 mg/dL   BUN 52 (H) 8 - 23 mg/dL   Creatinine, Ser 1.89 (H) 0.44 - 1.00 mg/dL   Calcium 8.8 (L) 8.9 - 10.3 mg/dL   GFR calc non Af Amer 28 (L) >60 mL/min   GFR calc Af Amer 32 (L) >60 mL/min   Anion gap 9 5 - 15     Comment: Performed at [facility], [place] [address] [place], [place] [address]  Sedimentation rate     Status: Abnormal   Collection Time: 06/11/18  5:11 AM  Result Value Ref Range   Sed Rate 68 (H) 0 - 22 mm/hr    Comment: Performed at [facility], [place] [address], [place], [place] [address]  C-reactive protein     Status: Abnormal   Collection Time: 06/11/18  5:11 AM  Result Value Ref Range   CRP 3.5 (H) <1.0 mg/dL    Comment: Performed at [facility], [place] [address] [place], [place] [address]  Glucose, capillary     Status: Abnormal   Collection Time: 06/11/18  7:44 AM  Result Value Ref Range   Glucose-Capillary 146 (H) 70 - 99 mg/dL  Glucose, capillary     Status: Abnormal   Collection Time: 06/11/18 11:59 AM  Result Value Ref Range   Glucose-Capillary 228 (H) 70 - 99 mg/dL  Glucose, capillary     Status: Abnormal   Collection Time: 06/11/18  4:16 PM  Result Value Ref Range   Glucose-Capillary 170 (H) 70 - 99 mg/dL  Glucose, capillary     Status: Abnormal   Collection Time: 06/11/18  8:14 PM  Result Value Ref Range   Glucose-Capillary 229 (H) 70 - 99 mg/dL   Comment 1 Notify RN   Glucose, capillary     Status: Abnormal   Collection Time: 06/12/18 12:14 AM  Result Value Ref Range   Glucose-Capillary 150 (H) 70 - 99 mg/dL   Comment 1 Notify RN   MRSA PCR Screening     Status: None   Collection Time: 06/12/18  3:22 AM  Result Value Ref Range   MRSA by PCR NEGATIVE NEGATIVE    Comment:        The GeneXpert MRSA Assay (FDA approved for NASAL specimens only), is one component of a comprehensive MRSA colonization surveillance program. It is not intended to diagnose MRSA infection nor to guide or monitor treatment for MRSA infections. Performed at [facility], [place] [address] [place], [place] [address]   Glucose, capillary     Status: Abnormal   Collection Time: 06/12/18  4:02 AM  Result  Value Ref Range   Glucose-Capillary 143 (H) 70 - 99  mg/dL   Comment 1 Notify RN   CBC     Status: Abnormal   Collection Time: 06/12/18  5:04 AM  Result Value Ref Range   WBC 9.1 4.0 - 10.5 K/uL   RBC 2.65 (L) 3.87 - 5.11 MIL/uL   Hemoglobin 7.9 (L) 12.0 - 15.0 g/dL   HCT 24.6 (L) 36.0 - 46.0 %   MCV 92.8 80.0 - 100.0 fL   MCH 29.8 26.0 - 34.0 pg   MCHC 32.1 30.0 - 36.0 g/dL   RDW 16.6 (H) 11.5 - 15.5 %   Platelets 258 150 - 400 K/uL   nRBC 0.0 0.0 - 0.2 %    Comment: Performed at [facility], [place] [address] [place], [place] [address]  Basic metabolic panel     Status: Abnormal   Collection Time: 06/12/18  5:04 AM  Result Value Ref Range   Sodium 133 (L) 135 - 145 mmol/L   Potassium 3.9 3.5 - 5.1 mmol/L   Chloride 105 98 - 111 mmol/L   CO2 19 (L) 22 - 32 mmol/L   Glucose, Bld 132 (H) 70 - 99 mg/dL   BUN 56 (H) 8 - 23 mg/dL   Creatinine, Ser 1.24 (H) 0.44 - 1.00 mg/dL   Calcium 9.0 8.9 - 10.3 mg/dL   GFR calc non Af Amer 46 (L) >60 mL/min   GFR calc Af Amer 54 (L) >60 mL/min   Anion gap 9 5 - 15    Comment: Performed at [facility], [place] [address] [place], [place] [address]  Magnesium     Status: Abnormal   Collection Time: 06/12/18  5:04 AM  Result Value Ref Range   Magnesium 1.2 (L) 1.7 - 2.4 mg/dL    Comment: Performed at [facility], [place] [address], [place], [place] [address]    No results found.  Review of Systems  Constitutional: Negative.   HENT: Negative.   Eyes: Negative.   Respiratory: Negative.   Cardiovascular: Negative.   Gastrointestinal: Negative.   Genitourinary: Negative.   Musculoskeletal: Negative.   Skin: Negative.   Psychiatric/Behavioral: Negative.    Blood pressure (!) 177/50, pulse 61, temperature 98.2 F (36.8 C), temperature source Oral, resp. rate 15, height 5\' 4"  (1.626 m), weight 84.8 kg, SpO2 100 %. Physical Exam  Constitutional: She is oriented to person, place, and time.  She appears well-developed.  HENT:  Head: Normocephalic and atraumatic.  Eyes: Pupils are equal, round, and reactive to light. EOM are normal.  Respiratory: Effort normal.  GI: Soft. She exhibits no distension.  Musculoskeletal:       Back:  Neurological: She is alert and oriented to person, place, and time.  Psychiatric: She has a normal mood and affect. Thought content normal.    Assessment/Plan: Plan for OR for bone biopsy of sacral ulcer to biopsy.  Patricia Mata 06/12/2018, 10:25 AM

## 2018-06-12 NOTE — Op Note (Signed)
DATE OF OPERATION: 06/12/2018  LOCATION: [facility] Room Inpatient  PREOPERATIVE DIAGNOSIS: sacral ulcer 2 x 2 x 2 cm  POSTOPERATIVE DIAGNOSIS: Same  PROCEDURE: Bone biopsy of sacral ulcer  SURGEON: Kelliann Pendergraph [place] Brysin Towery, DO  EBL: 5 cc  CONDITION: Stable  COMPLICATIONS: None  INDICATION: The patient, Patricia Mata, is a 62 y.o. female born on 1955/07/23, is here for treatment of a sacral ulcer.   PROCEDURE DETAILS:  The patient was seen prior to surgery and marked.  The IV antibiotics were given. The patient was taken to the operating room and given a general anesthetic. A standard time out was performed and all information was confirmed by those in the room. SCDs were placed.  The patient was placed in the lateral position.  The skin of the sacrum was prepped and draped with betadine.  The area was irrigated with saline.   The ronguer was used to obtain a bone specimen and tissue specimen of the sacral tip from the 2 x 2 cm wound.  The area was then irrigated with antibiotic solution.  The bovie was used due to the history of bleeding.  The thrombin was placed.  A kerlex wet to dry was placed with ABDs.  The patient was allowed to wake up and taken to recovery room in stable condition at the end of the case. The family was notified at the end of the case.

## 2018-06-12 NOTE — Anesthesia Postprocedure Evaluation (Signed)
Anesthesia Post Note  Patient: Wade Sigala  Procedure(s) Performed: DEBRIDMENT OF DECUBITUS ULCER (N/A )     Patient location during evaluation: PACU Anesthesia Type: General Level of consciousness: awake and alert Pain management: pain level controlled Vital Signs Assessment: post-procedure vital signs reviewed and stable Respiratory status: spontaneous breathing, nonlabored ventilation, respiratory function stable and patient connected to nasal cannula oxygen Cardiovascular status: blood pressure returned to baseline and stable Postop Assessment: no apparent nausea or vomiting Anesthetic complications: no    Last Vitals:  Vitals:   06/12/18 1215 06/12/18 1230  BP: (!) 145/45 (!) 149/94  Pulse: (!) 59 (!) 59  Resp: 12 14  Temp: 37 C 37 C  SpO2: 92% 99%    Last Pain:  Vitals:   06/12/18 1215  TempSrc:   PainSc: Asleep                 Mozelle Remlinger

## 2018-06-12 NOTE — Transfer of Care (Signed)
Immediate Anesthesia Transfer of Care Note  Patient: Patricia Mata  Procedure(s) Performed: DEBRIDMENT OF DECUBITUS ULCER (N/A )  Patient Location: PACU  Anesthesia Type:MAC  Level of Consciousness: awake, alert  and oriented  Airway & Oxygen Therapy: Patient Spontanous Breathing and Patient connected to face mask oxygen  Post-op Assessment: Report given to RN and Post -op Vital signs reviewed and stable  Post vital signs: Reviewed and stable  Last Vitals:  Vitals Value Taken Time  BP 179/58 06/12/2018 11:33 AM  Temp    Pulse 105 06/12/2018 11:35 AM  Resp 23 06/12/2018 11:35 AM  SpO2 100 % 06/12/2018 11:35 AM  Vitals shown include unvalidated device data.  Last Pain:  Vitals:   06/12/18 0803  TempSrc:   PainSc: 0-No pain      Patients Stated Pain Goal: 2 ([id number])  Complications: No apparent anesthesia complications

## 2018-06-12 NOTE — Anesthesia Procedure Notes (Signed)
Procedure Name: LMA Insertion Date/Time: 06/12/2018 11:00 AM Performed by: Niel Hummer, CRNA Pre-anesthesia Checklist: Patient identified, Emergency Drugs available, Suction available and Patient being monitored Patient Re-evaluated:Patient Re-evaluated prior to induction Oxygen Delivery Method: Circle system utilized Preoxygenation: Pre-oxygenation with 100% oxygen Induction Type: IV induction Ventilation: Mask ventilation without difficulty LMA: LMA inserted LMA Size: 4.0 Dental Injury: Teeth and Oropharynx as per pre-operative assessment

## 2018-06-12 NOTE — Progress Notes (Signed)
PROGRESS NOTE    Patricia Mata  JIR:[id number] DOB: [birth year] DOA: 06/08/2018 PCP: Glendale Chard, MD  Brief Narrative:  Patricia Mata is a 62 y.o. female with medical history significant of severe pulmonary hypertension, colon cancer(in remission, no active tx), diabetes, hypertension, anemia, myelodysplastic syndrome, severe AS who presented with bleeding from her Sacral wound that developed after her ICU stay when she was admitted from 10/20 to 11/12.  She was discharged to rehab facility where she has been having blood clots found in the dressings no purulent discharge, no foul smell, noted no chills or fevers.  She requires transfusion of PRBC every 2 to 3 weeks secondary to MDS.  Patient was hospitalized due to severe anemia and bleeding from her sacral wound.  Assessment & Plan:   Symptomatic anemia Multifactorial related to her hx of MDS (transfusion dependent, requires transfusions every few weeks per past notes) and with possible contribution from bleeding decubitus ulcer.  She was transfused 2 units of PRBC.  Hemoglobin improved to 9.1 from 6.9.  But has again drifted down to 7.9 this morning.  No overt bleeding has been noted.  Continue to monitor for now.  May need to be transfused again.  Elevated ferritin likely due to combination of multiple blood transfusions as well as inflammation.  Sacral wound with bleeding This wound developed during hospitalization from 10/28 - 11/12.  General surgery was consulted due to bleeding.  Bleeding has subsided.  General surgery has signed off.  Local wound care.   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 Discussed with Dr. Drucilla Schmidt with infectious disease who recommends consulting plastics to try and get a bone culture.  Discussed with Dr.Dillingham with plastic surgery.  Patient underwent bone biopsy.  Sample sent to the lab for cultures.  Currently off of antibiotics.  Further management per ID.    Hypothyroidism, newly  diagnosed TSH in 60's, low free t4.  T3 is also low.  Patient was started on low-dose Synthroid due to her recent cardiac issues.  Possible side effect from amiodarone.  I recommend rechecking thyroid function test in 3 weeks to make sure that sitting in the right direction.  MDS (myelodysplastic syndrome) Follows with Dr. Alen Blew.  Transfusion dependent.  He's mentioned having hospice involved in the future and she'd like to defer this as long as possible.    Acute renal failure Creatinine was 2.15 at admission.  She was given IV fluids with improvement.  Creatinine was again elevated at 1.8 yesterday and she was given IV fluids.  Creatinine improved to 1.24 today.  Magnesium noted to be 1.2 and will be repleted.  Monitor urine output.  Avoid nephrotoxic agents.    Essential hypertension Continue metoprolol (transitioned from toprol xl as an outpatient), hydralazine.  Holding lasix and spironolactone.  Hyperlipidemia  Continue statin  Severe Pulm Hypertension Continue sildenafil, currently holding lasix, follow closely.    Moderate AS/MS Not candidate for intervention given PAH  Chronic Atrial Fibrillation Not on anticoagulation given above.  Amiodarone 200 mg BID.  Heart rate is stable.  Hyponatremia  Sodium level stable.  Constipation  Still has not had a bowel movement.  Add Senokot.  If there is no response may have to given enema.  Diabetes mellitus type 2 HbA1c 6.3.  No further episodes of hypoglycemia.  Continue to monitor CBG.  Patient is on Lantus.    Hypomagnesemia Magnesium noted to be extremely low this morning.  Will be aggressively repleted.  Recheck tomorrow.  DVT prophylaxis:  SCD Code Status: DNR Family Communication: Discussed with the patient and her son at bedside Disposition Plan: Will return to skilled nursing facility when improved from a medical standpoint.   Consultants:   Surgery  Plastic surgery  Infectious disease  Procedures:    none   Antimicrobials:  Anti-infectives (From admission, onward)   Start     Dose/Rate Route Frequency Ordered Stop   06/12/18 1123  polymyxin B 500,000 Units, bacitracin 50,000 Units in sodium chloride 0.9 % 500 mL irrigation  Status:  Discontinued       As needed 06/12/18 1123 06/12/18 1133   06/09/18 1000  metroNIDAZOLE (FLAGYL) tablet 250 mg  Status:  Discontinued     250 mg Oral Daily 06/09/18 0401 06/11/18 1149   06/08/18 1745  clindamycin (CLEOCIN) IVPB 600 mg  Status:  Discontinued     600 mg 100 mL/hr over 30 Minutes Intravenous  Once 06/08/18 1731 06/08/18 1732   06/08/18 1745  vancomycin (VANCOCIN) 1,500 mg in sodium chloride 0.9 % 500 mL IVPB     1,500 mg 250 mL/hr over 120 Minutes Intravenous  Once 06/08/18 1740 06/08/18 2030     Subjective: Patient seen after she came back from the OR.  Denies any pain at this time.  Her son and sister are at the bedside.  Objective: Vitals:   06/12/18 1145 06/12/18 1200 06/12/18 1215 06/12/18 1230  BP: (!) 172/39 (!) 158/41 (!) 145/45 (!) 149/94  Pulse: (!) 57 (!) 57 (!) 59 (!) 59  Resp: 11 17 12 14   Temp:   98.6 F (37 C) 98.6 F (37 C)  TempSrc:      SpO2: 100% 99% 92% 99%  Weight:      Height:        Intake/Output Summary (Last 24 hours) at 06/12/2018 1306 Last data filed at 06/12/2018 1136 Gross per 24 hour  Intake 2198.31 ml  Output 1005 ml  Net 1193.31 ml   Filed Weights   06/08/18 1402  Weight: 84.8 kg    Examination:  General exam: Awake alert.  In no distress Respiratory system: Normal effort at rest.  Clear to auscultation bilaterally. Cardiovascular system: S1-S2 is normal regular.  No S3-S4.  No rubs murmurs or bruit Gastrointestinal system:.  Nontender nondistended.  Bowel sounds are present normal.  No masses organomegaly Central nervous system: No obvious focal neurological deficits Skin: Stage IV sacral decub.  No active bleeding.  Good granulation tissue.    Data Reviewed: I have  personally reviewed following labs and imaging studies  CBC: Recent Labs  Lab 06/08/18 1526 06/09/18 0704 06/10/18 0456 06/11/18 0511 06/12/18 0504  WBC 12.0* 9.5 9.9 8.9 9.1  NEUTROABS 9.3*  --   --   --   --   HGB 6.9* 7.7* 9.1* 8.3* 7.9*  HCT 21.7* 23.4* 27.6* 25.1* 24.6*  MCV 94.8 94.0 92.0 91.3 92.8  PLT 339 289 264 254 778   Basic Metabolic Panel: Recent Labs  Lab 06/08/18 1526 06/09/18 0704 06/10/18 0456 06/11/18 0511 06/12/18 0504  NA 132* 136 134* 132* 133*  K 4.7 4.3 4.0 3.9 3.9  CL 101 105 105 104 105  CO2 21* 22 20* 19* 19*  GLUCOSE 151* 100* 113* 142* 132*  BUN 60* 58* 48* 52* 56*  CREATININE 2.15* 1.58* 1.30* 1.89* 1.24*  CALCIUM 9.1 9.2 8.9 8.8* 9.0  MG  --  1.4* 1.5*  --  1.2*  PHOS  --  4.9*  --   --   --  GFR: Estimated Creatinine Clearance: 48.9 mL/min (A) (by C-G formula based on SCr of 1.24 mg/dL (H)). Liver Function Tests: Recent Labs  Lab 06/09/18 0704 06/10/18 0456  AST 43* 45*  ALT 30 32  ALKPHOS 110 116  BILITOT 2.1* 2.1*  PROT 7.0 6.6  ALBUMIN 2.9* 2.8*   CBG: Recent Labs  Lab 06/11/18 1616 06/11/18 2014 06/12/18 0014 06/12/18 0402 06/12/18 1148  GLUCAP 170* 229* 150* 143* 117*     Radiology Studies: No results found.    Scheduled Meds: . amiodarone  200 mg Oral BID  . atorvastatin  40 mg Oral Daily  . collagenase   Topical BID  . feeding supplement (GLUCERNA SHAKE)  237 mL Oral TID BM  . hydrALAZINE  100 mg Oral TID  . HYDROmorphone      . insulin aspart  0-9 Units Subcutaneous Q4H  . insulin glargine  14 Units Subcutaneous QPC breakfast  . latanoprost  1 drop Both Eyes QHS  . levothyroxine  25 mcg Oral Q0600  . metoprolol tartrate  12.5 mg Oral BID  . nutrition supplement (JUVEN)  1 packet Oral BID BM  . pantoprazole  40 mg Oral Daily  . polyethylene glycol  17 g Oral BID  . senna  2 tablet Oral QHS  . sildenafil  20 mg Oral TID   Continuous Infusions: . lactated ringers    . magnesium sulfate 1 - 4  g bolus IVPB       LOS: 4 days    Bonnielee Haff, MD [facility] Pager [phone]   If 7PM-7AM, please contact night-coverage [email or web address] Password [email or web address] 06/12/2018, 1:06 PM

## 2018-06-12 NOTE — Progress Notes (Signed)
Subjective: No new complaints patient tolerated I&D for bone biopsy without difficulty.  Cultures are incubating   Antibiotics:  Anti-infectives (From admission, onward)   Start     Dose/Rate Route Frequency Ordered Stop   06/13/18 1800  vancomycin (VANCOCIN) IVPB 750 mg/150 ml premix     750 mg 150 mL/hr over 60 Minutes Intravenous Every 24 hours 06/12/18 1750     06/12/18 2000  meropenem (MERREM) 1 g in sodium chloride 0.9 % 100 mL IVPB     1 g 200 mL/hr over 30 Minutes Intravenous Every 12 hours 06/12/18 1750     06/12/18 1715  vancomycin (VANCOCIN) 1,500 mg in sodium chloride 0.9 % 500 mL IVPB     1,500 mg 250 mL/hr over 120 Minutes Intravenous  Once 06/12/18 1709     06/12/18 1645  metroNIDAZOLE (FLAGYL) tablet 500 mg  Status:  Discontinued     500 mg Oral Every 8 hours 06/12/18 1642 06/12/18 1750   06/12/18 1123  polymyxin B 500,000 Units, bacitracin 50,000 Units in sodium chloride 0.9 % 500 mL irrigation  Status:  Discontinued       As needed 06/12/18 1123 06/12/18 1133   06/09/18 1000  metroNIDAZOLE (FLAGYL) tablet 250 mg  Status:  Discontinued     250 mg Oral Daily 06/09/18 0401 06/11/18 1149   06/08/18 1745  clindamycin (CLEOCIN) IVPB 600 mg  Status:  Discontinued     600 mg 100 mL/hr over 30 Minutes Intravenous  Once 06/08/18 1731 06/08/18 1732   06/08/18 1745  vancomycin (VANCOCIN) 1,500 mg in sodium chloride 0.9 % 500 mL IVPB     1,500 mg 250 mL/hr over 120 Minutes Intravenous  Once 06/08/18 1740 06/08/18 2030      Medications: Scheduled Meds: . amiodarone  200 mg Oral BID  . atorvastatin  40 mg Oral Daily  . collagenase   Topical BID  . feeding supplement (GLUCERNA SHAKE)  237 mL Oral TID BM  . hydrALAZINE  100 mg Oral TID  . HYDROmorphone      . insulin aspart  0-9 Units Subcutaneous Q4H  . insulin glargine  14 Units Subcutaneous QPC breakfast  . latanoprost  1 drop Both Eyes QHS  . levothyroxine  25 mcg Oral Q0600  . metoprolol tartrate  12.5  mg Oral BID  . nutrition supplement (JUVEN)  1 packet Oral BID BM  . pantoprazole  40 mg Oral Daily  . polyethylene glycol  17 g Oral BID  . senna  2 tablet Oral QHS  . sildenafil  20 mg Oral TID   Continuous Infusions: . lactated ringers    . magnesium sulfate 1 - 4 g bolus IVPB    . meropenem (MERREM) IV    . vancomycin    . [START ON 06/13/2018] vancomycin     PRN Meds:.acetaminophen **OR** acetaminophen, bisacodyl, HYDROcodone-acetaminophen, milk and molasses, ondansetron **OR** ondansetron (ZOFRAN) IV, silver nitrate applicators, traMADol    Objective: Weight change:   Intake/Output Summary (Last 24 hours) at 06/12/2018 1831 Last data filed at 06/12/2018 1800 Gross per 24 hour  Intake 1876.67 ml  Output 1405 ml  Net 471.67 ml   Blood pressure (!) 155/60, pulse 66, temperature 98 F (36.7 C), temperature source Oral, resp. rate 17, height 5\' 4"  (1.626 m), weight 84.8 kg, SpO2 99 %. Temp:  [98 F (36.7 C)-99.3 F (37.4 C)] 98 F (36.7 C) (11/29 1420) Pulse Rate:  [56-68] 66 (11/29 1420) Resp:  [  11-20] 17 (11/29 1420) BP: (145-179)/(39-94) 155/60 (11/29 1420) SpO2:  [92 %-100 %] 99 % (11/29 1420)  Physical Exam: General: Alert and awake, oriented x3, not in any acute distress. HEENT: anicteric sclera, EOMI CVS regular rate, normal  Chest: , no wheezing, no respiratory distress Abdomen: soft non-distended,  Extremities: no edema or deformity noted bilaterally Skin: Decubitus ulcer not examined Neuro: nonfocal  CBC:    BMET Recent Labs    06/11/18 0511 06/12/18 0504  NA 132* 133*  K 3.9 3.9  CL 104 105  CO2 19* 19*  GLUCOSE 142* 132*  BUN 52* 56*  CREATININE 1.89* 1.24*  CALCIUM 8.8* 9.0     Liver Panel  Recent Labs    06/10/18 0456  PROT 6.6  ALBUMIN 2.8*  AST 45*  ALT 32  ALKPHOS 116  BILITOT 2.1*  BILIDIR 0.9*  IBILI 1.2*       Sedimentation Rate Recent Labs    06/11/18 0511  ESRSEDRATE 68*   C-Reactive Protein Recent  Labs    06/11/18 0511  CRP 3.5*    Micro Results: Recent Results (from the past 720 hour(s))  Culture, Urine     Status: None   Collection Time: 05/14/18 12:34 AM  Result Value Ref Range Status   Specimen Description URINE, CATHETERIZED  Final   Special Requests NONE  Final   Culture   Final    NO GROWTH Performed at [facility], [place] [address], [place], [place] [address]    Report Status 05/15/2018 FINAL  Final  MRSA PCR Screening     Status: None   Collection Time: 06/12/18  3:22 AM  Result Value Ref Range Status   MRSA by PCR NEGATIVE NEGATIVE Final    Comment:        The GeneXpert MRSA Assay (FDA approved for NASAL specimens only), is one component of a comprehensive MRSA colonization surveillance program. It is not intended to diagnose MRSA infection nor to guide or monitor treatment for MRSA infections. Performed at [facility], [place] [address], [place], [place] [address]   Aerobic/Anaerobic Culture (surgical/deep wound)     Status: None (Preliminary result)   Collection Time: 06/12/18 11:36 AM  Result Value Ref Range Status   Specimen Description   Final    BONE COCCYGEAL Performed at [place] [address], [place], [place] [address]    Special Requests   Final    NONE Performed at [facility], [place] [address] [place], [place] [address]    Gram Stain   Final    NO WBC SEEN NO ORGANISMS SEEN Performed at [facility], [place] [address] [place], [place] [address]    Culture PENDING  Incomplete   Report Status PENDING  Incomplete  Aerobic/Anaerobic Culture (surgical/deep wound)     Status: None (Preliminary result)   Collection Time: 06/12/18 11:42 AM  Result Value Ref Range Status   Specimen Description   Final    TISSUE SOFT TISSUE SACRAL Performed at [place] [address], [place], [place] [address]    Special Requests   Final    NONE Performed at  [facility], [place] [address], [place], [place] [address]    Gram Stain   Final    FEW WBC PRESENT,BOTH PMN AND MONONUCLEAR NO ORGANISMS SEEN Performed at [facility], [place] [address] [place], [place] [address]    Culture PENDING  Incomplete  Report Status PENDING  Incomplete    Studies/Results: [place] Ekg Site Rite  Result Date: 06/12/2018 If Site Rite image not attached, placement could not be confirmed due to current cardiac rhythm.     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 female with decubitus ulcer after protracted hospital stay and stay in skilled nursing facility admitted for bleeding from the ulcer seen by surgery then found on MRI to have evidence of early coccygeal osteomyelitis.  I am grateful that Dr. Elisabeth Cara performed I&D of biopsy of bone that is been sent for culture.  Hopefully we can isolate an organism and give the patient targeted therapy.  In the interim I am initiating broad-spectrum antibiotics in the form of vancomycin and meropenem.  I had previously contemplated giving cefepime and metronidazole but the patient tells me that she does not want to stop drinking alcohol so metronidazole is not a good option she also has a significant cephalosporin allergy  I will put in O PAT consult as well and I will follow-up cultures   LOS: 4 days   Alcide Evener 06/12/2018, 6:31 PM

## 2018-06-13 ENCOUNTER — Encounter (HOSPITAL_COMMUNITY): Payer: Self-pay | Admitting: Plastic Surgery

## 2018-06-13 LAB — BASIC METABOLIC PANEL
Anion gap: 9 (ref 5–15)
BUN: 44 mg/dL — ABNORMAL HIGH (ref 8–23)
CO2: 23 mmol/L (ref 22–32)
Calcium: 8.9 mg/dL (ref 8.9–10.3)
Chloride: 106 mmol/L (ref 98–111)
Creatinine, Ser: 0.97 mg/dL (ref 0.44–1.00)
GFR calc Af Amer: >60 mL/min (ref >60)
GFR calc non Af Amer: >60 mL/min (ref >60)
Glucose, Bld: 95 mg/dL (ref 70–99)
Potassium: 3.8 mmol/L (ref 3.5–5.1)
Sodium: 138 mmol/L (ref 135–145)

## 2018-06-13 LAB — GLUCOSE, CAPILLARY
Glucose-Capillary: 102 mg/dL — ABNORMAL HIGH (ref 70–99)
Glucose-Capillary: 103 mg/dL — ABNORMAL HIGH (ref 70–99)
Glucose-Capillary: 110 mg/dL — ABNORMAL HIGH (ref 70–99)
Glucose-Capillary: 119 mg/dL — ABNORMAL HIGH (ref 70–99)
Glucose-Capillary: 131 mg/dL — ABNORMAL HIGH (ref 70–99)
Glucose-Capillary: 152 mg/dL — ABNORMAL HIGH (ref 70–99)

## 2018-06-13 LAB — CBC
HCT: 24.1 % — ABNORMAL LOW (ref 36.0–46.0)
Hemoglobin: 7.6 g/dL — ABNORMAL LOW (ref 12.0–15.0)
MCH: 29.1 pg (ref 26.0–34.0)
MCHC: 31.5 g/dL (ref 30.0–36.0)
MCV: 92.3 fL (ref 80.0–100.0)
Platelets: 247 10*3/uL (ref 150–400)
RBC: 2.61 MIL/uL — ABNORMAL LOW (ref 3.87–5.11)
RDW: 16.7 % — ABNORMAL HIGH (ref 11.5–15.5)
WBC: 8.4 10*3/uL (ref 4.0–10.5)
nRBC: 0 % (ref 0.0–0.2)

## 2018-06-13 LAB — MAGNESIUM: Magnesium: 1.3 mg/dL — ABNORMAL LOW (ref 1.7–2.4)

## 2018-06-13 MED ORDER — VANCOMYCIN HCL 10 G IV SOLR
1250.0000 mg | INTRAVENOUS | Status: DC
  Administered 2018-06-13: 1250 mg via INTRAVENOUS
  Filled 2018-06-13: qty 1250

## 2018-06-13 MED ORDER — SODIUM CHLORIDE 0.9% FLUSH
10.0000 mL | Freq: Two times a day (BID) | INTRAVENOUS | Status: DC
  Administered 2018-06-13: 10 mL

## 2018-06-13 MED ORDER — SODIUM CHLORIDE 0.9 % IV SOLN
1.0000 g | Freq: Three times a day (TID) | INTRAVENOUS | Status: DC
  Administered 2018-06-13 – 2018-06-14 (×2): 1 g via INTRAVENOUS
  Filled 2018-06-13 (×3): qty 1

## 2018-06-13 MED ORDER — MAGNESIUM SULFATE 4 GM/100ML IV SOLN
4.0000 g | Freq: Once | INTRAVENOUS | Status: AC
  Administered 2018-06-13: 4 g via INTRAVENOUS
  Filled 2018-06-13: qty 100

## 2018-06-13 MED ORDER — FLEET ENEMA 7-19 GM/118ML RE ENEM
1.0000 | ENEMA | Freq: Once | RECTAL | Status: AC
  Administered 2018-06-13: 1 via RECTAL
  Filled 2018-06-13: qty 1

## 2018-06-13 MED ORDER — SODIUM CHLORIDE 0.9% FLUSH: 10.0000 mL | INTRAVENOUS | Status: DC | PRN

## 2018-06-13 NOTE — Progress Notes (Signed)
Dressing to sacrum changed.  Pt tolerated procedure well.

## 2018-06-13 NOTE — Progress Notes (Signed)
Peripherally Inserted Central Catheter/Midline Placement  The IV Nurse has discussed with the patient and/or persons authorized to consent for the patient, the purpose of this procedure and the potential benefits and risks involved with this procedure.  The benefits include less needle sticks, lab draws from the catheter, and the patient may be discharged home with the catheter. Risks include, but not limited to, infection, bleeding, blood clot (thrombus formation), and puncture of an artery; nerve damage and irregular heartbeat and possibility to perform a PICC exchange if needed/ordered by physician.  Alternatives to this procedure were also discussed.  Bard Power PICC patient education guide, fact sheet on infection prevention and patient information card has been provided to patient /or left at bedside.    PICC/Midline Placement Documentation  PICC Single Lumen 06/13/18 PICC Left Brachial 43 cm 0 cm (Active)  Indication for Insertion or Continuance of Line Home intravenous therapies (PICC only) 06/13/2018 10:00 AM  Exposed Catheter (cm) 0 cm 06/13/2018 10:00 AM  Site Assessment Clean;Dry;Intact 06/13/2018 10:00 AM  Line Status Flushed;Blood return noted 06/13/2018 10:00 AM  Dressing Type Transparent 06/13/2018 10:00 AM  Dressing Status Clean;Dry;Intact;Antimicrobial disc in place 06/13/2018 10:00 AM  Dressing Change Due 06/20/18 06/13/2018 10:00 AM       Jule Economy Horton 06/13/2018, 10:22 AM

## 2018-06-13 NOTE — Progress Notes (Signed)
PROGRESS NOTE    Patricia Mata  NKN:[id number] DOB: 05-Jun-1956 DOA: 06/08/2018 PCP: Glendale Chard, MD  Brief Narrative:  Patricia Mata is a 62 y.o. female with medical history significant of severe pulmonary hypertension, colon cancer(in remission, no active tx), diabetes, hypertension, anemia, myelodysplastic syndrome, severe AS who presented with bleeding from her Sacral wound that developed after her ICU stay when she was admitted from 10/20 to 11/12.  She was discharged to rehab facility where she has been having blood clots found in the dressings no purulent discharge, no foul smell, noted no chills or fevers.  She requires transfusion of PRBC every 2 to 3 weeks secondary to MDS.  Patient was hospitalized due to severe anemia and bleeding from her sacral wound.  MRI was done which raised concern for coccygeal osteomyelitis.  Patient seen by infectious disease.  Plastic surgery was consulted and the patient underwent bone biopsy.  Patient started on vancomycin and meropenem.   Assessment & Plan:   Symptomatic anemia Multifactorial related to her hx of MDS (transfusion dependent, requires transfusions every few weeks per past notes) and with possible contribution from bleeding decubitus ulcer.  She was transfused 2 units of PRBC.  Hemoglobin improved to 9.1 from 6.9.  Noted to be trending down again.  We will recheck tomorrow.  We will likely have to transfuse her again before she is discharged.  Elevated ferritin likely due to combination of multiple blood transfusions as well as inflammation.  Sacral wound with bleeding This wound developed during hospitalization from 10/28 - 11/12.  General surgery was consulted due to bleeding.  Bleeding has subsided.  General surgery has signed off.  Local wound care.   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  Infectious disease was consulted.  Plastic surgery was consulted.  Appreciate Dr. Eusebio Friendly assistance.  Patient underwent  bone biopsy yesterday.  Sent for culture.  Patient started on vancomycin and meropenem.  PICC line to be placed.     Hypothyroidism, newly diagnosed TSH in 60's, low free t4.  T3 was also low.  Patient was started on low-dose Synthroid due to her recent cardiac issues.  Possible side effect from amiodarone. Recommend rechecking thyroid function test in 3 weeks to make sure that sitting in the right direction.  MDS (myelodysplastic syndrome) Follows with Dr. Alen Blew.  Transfusion dependent.  He's mentioned having hospice involved in the future and she'd like to defer this as long as possible.    Acute renal failure Creatinine was 2.15 at admission.  She was given IV fluids with improvement.  Creatinine was again elevated at 1.8 on 11/28 and she was given IV fluids.  Creatinine has improved to 0.97 today.       Essential hypertension Continue metoprolol (transitioned from toprol xl as an outpatient), hydralazine.  Holding lasix and spironolactone.  Hyperlipidemia  Continue statin  Severe Pulm Hypertension Continue sildenafil, currently holding lasix, follow closely.    Moderate AS/MS Not candidate for intervention given PAH  Chronic Atrial Fibrillation Not on anticoagulation given above.  Amiodarone 200 mg BID.  Heart rate is stable.  Hyponatremia  Sodium level stable.  Constipation  Patient still has not had a good bowel movement.  She had some soft stool yesterday.  Requesting an enema which will be ordered.  Diabetes mellitus type 2 with episodes of hypoglycemia HbA1c 6.3.  No further episodes of hypoglycemia.  Continue to monitor CBG.  Patient is on Lantus.    Hypomagnesemia Magnesium noted to be low again  today.  Unclear if she received magnesium sulfate ordered yesterday.  Will be reordered.  Recheck tomorrow.  DVT prophylaxis: SCD Code Status: DNR Family Communication: Discussed with the patient Disposition Plan: Return to skilled nursing facility when medically  stable and all testing has been completed.   Consultants:   Surgery  Plastic surgery  Infectious disease  Procedures:   none   Antimicrobials:  Anti-infectives (From admission, onward)   Start     Dose/Rate Route Frequency Ordered Stop   06/13/18 1800  vancomycin (VANCOCIN) IVPB 750 mg/150 ml premix     750 mg 150 mL/hr over 60 Minutes Intravenous Every 24 hours 06/12/18 1750     06/12/18 2000  meropenem (MERREM) 1 g in sodium chloride 0.9 % 100 mL IVPB     1 g 200 mL/hr over 30 Minutes Intravenous Every 12 hours 06/12/18 1750     06/12/18 1715  vancomycin (VANCOCIN) 1,500 mg in sodium chloride 0.9 % 500 mL IVPB     1,500 mg 250 mL/hr over 120 Minutes Intravenous  Once 06/12/18 1709 06/13/18 0049   06/12/18 1645  metroNIDAZOLE (FLAGYL) tablet 500 mg  Status:  Discontinued     500 mg Oral Every 8 hours 06/12/18 1642 06/12/18 1750   06/12/18 1123  polymyxin B 500,000 Units, bacitracin 50,000 Units in sodium chloride 0.9 % 500 mL irrigation  Status:  Discontinued       As needed 06/12/18 1123 06/12/18 1133   06/09/18 1000  metroNIDAZOLE (FLAGYL) tablet 250 mg  Status:  Discontinued     250 mg Oral Daily 06/09/18 0401 06/11/18 1149   06/08/18 1745  clindamycin (CLEOCIN) IVPB 600 mg  Status:  Discontinued     600 mg 100 mL/hr over 30 Minutes Intravenous  Once 06/08/18 1731 06/08/18 1732   06/08/18 1745  vancomycin (VANCOCIN) 1,500 mg in sodium chloride 0.9 % 500 mL IVPB     1,500 mg 250 mL/hr over 120 Minutes Intravenous  Once 06/08/18 1740 06/08/18 2030     Subjective: Patient states that she is feeling well.  But does mention that she still has not had a bowel.  Requesting an enema.  Denies any abdominal pain nausea or vomiting.  No further episodes of bleeding.  Objective: Vitals:   06/12/18 1420 06/12/18 2130 06/13/18 0516 06/13/18 0519  BP: (!) 155/60 (!) 161/50  (!) 118/37  Pulse: 66 66 (!) 58 (!) 57  Resp: 17 12  16   Temp: 98 F (36.7 C) 98.1 F (36.7 C)  98.1  F (36.7 C)  TempSrc: Oral Oral  Oral  SpO2: 99% 100% 100% 98%  Weight:      Height:        Intake/Output Summary (Last 24 hours) at 06/13/2018 0851 Last data filed at 06/13/2018 0600 Gross per 24 hour  Intake 2199.86 ml  Output 2155 ml  Net 44.86 ml   Filed Weights   06/08/18 1402  Weight: 84.8 kg    Examination:  General exam: Awake alert.  In no distress Respiratory system: Normal effort at rest.  Clear to auscultation bilaterally Cardiovascular system: S1-S2 is normal regular.  No S3-S4.  No rubs murmurs or bruit Gastrointestinal system: Abdomen is soft.  Nontender nondistended.  Bowel sounds present. Central nervous system: No obvious focal neurological deficits Skin: Stage IV sacral decub.  No active bleeding.  Good granulation tissue was noted when last examined.    Data Reviewed: I have personally reviewed following labs and imaging studies  CBC: Recent  Labs  Lab 06/08/18 1526 06/09/18 0704 06/10/18 0456 06/11/18 0511 06/12/18 0504 06/13/18 0532  WBC 12.0* 9.5 9.9 8.9 9.1 8.4  NEUTROABS 9.3*  --   --   --   --   --   HGB 6.9* 7.7* 9.1* 8.3* 7.9* 7.6*  HCT 21.7* 23.4* 27.6* 25.1* 24.6* 24.1*  MCV 94.8 94.0 92.0 91.3 92.8 92.3  PLT 339 289 264 254 258 332   Basic Metabolic Panel: Recent Labs  Lab 06/09/18 0704 06/10/18 0456 06/11/18 0511 06/12/18 0504 06/13/18 0532  NA 136 134* 132* 133* 138  K 4.3 4.0 3.9 3.9 3.8  CL 105 105 104 105 106  CO2 22 20* 19* 19* 23  GLUCOSE 100* 113* 142* 132* 95  BUN 58* 48* 52* 56* 44*  CREATININE 1.58* 1.30* 1.89* 1.24* 0.97  CALCIUM 9.2 8.9 8.8* 9.0 8.9  MG 1.4* 1.5*  --  1.2* 1.3*  PHOS 4.9*  --   --   --   --    GFR: Estimated Creatinine Clearance: 62.5 mL/min (by C-G formula based on SCr of 0.97 mg/dL). Liver Function Tests: Recent Labs  Lab 06/09/18 0704 06/10/18 0456  AST 43* 45*  ALT 30 32  ALKPHOS 110 116  BILITOT 2.1* 2.1*  PROT 7.0 6.6  ALBUMIN 2.9* 2.8*   CBG: Recent Labs  Lab  06/12/18 1658 06/12/18 2210 06/12/18 2348 06/13/18 0401 06/13/18 0710  GLUCAP 142* 123* 140* 110* 102*     Radiology Studies: [place] Ekg Site Rite  Result Date: 06/12/2018 If Site Rite image not attached, placement could not be confirmed due to current cardiac rhythm.     Scheduled Meds: . amiodarone  200 mg Oral BID  . atorvastatin  40 mg Oral Daily  . collagenase   Topical BID  . feeding supplement (GLUCERNA SHAKE)  237 mL Oral TID BM  . hydrALAZINE  100 mg Oral TID  . insulin aspart  0-9 Units Subcutaneous Q4H  . insulin glargine  14 Units Subcutaneous QPC breakfast  . latanoprost  1 drop Both Eyes QHS  . levothyroxine  25 mcg Oral Q0600  . metoprolol tartrate  12.5 mg Oral BID  . nutrition supplement (JUVEN)  1 packet Oral BID BM  . pantoprazole  40 mg Oral Daily  . polyethylene glycol  17 g Oral BID  . senna  2 tablet Oral QHS  . sildenafil  20 mg Oral TID  . sodium phosphate  1 enema Rectal Once   Continuous Infusions: . lactated ringers 50 mL/hr at 06/12/18 1850  . magnesium sulfate 1 - 4 g bolus IVPB    . magnesium sulfate 1 - 4 g bolus IVPB    . meropenem (MERREM) IV 1 g (06/13/18 0836)  . vancomycin       LOS: 5 days    Bonnielee Haff, MD [facility] Pager [phone]   If 7PM-7AM, please contact night-coverage [email or web address] Password [email or web address] 06/13/2018, 8:51 AM

## 2018-06-13 NOTE — Progress Notes (Signed)
Pharmacy Antibiotic Note  Patricia Mata is a 62 y.o. female admitted on 06/08/2018 with medical history significant of severe pulmonary hypertension,colon cancer(in remission, no active tx), diabetes, hypertension, anemia,myelodysplastic syndrome,severe AS who presented with bleeding from her sacral wound.  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 Today is Day #2 of abx.  Pt's SCr improved to 0.97, est CrCl ~63 ml/min.  Plan: Change Vancomycin maintenance dose to 1250mg  IV q24h (AUC 511, SCr 0.97, Adj BW) Change Merrem to 1gm IV q8h  Follow renal function, cultures and clinical course  Height: 5\' 4"  (162.6 cm) Weight: 187 lb (84.8 kg) IBW/kg (Calculated) : 54.7  Temp (24hrs), Avg:98.3 F (36.8 C), Min:98 F (36.7 C), Max:98.6 F (37 C)  Recent Labs  Lab 06/09/18 0704 06/10/18 0456 06/11/18 0511 06/12/18 0504 06/13/18 0532  WBC 9.5 9.9 8.9 9.1 8.4  CREATININE 1.58* 1.30* 1.89* 1.24* 0.97    Estimated Creatinine Clearance: 62.5 mL/min (by C-G formula based on SCr of 0.97 mg/dL).    Allergies  Allergen Reactions  . Ancef [Cefazolin] Itching    Severe itching- after procedure, ancef was the antibiotic.-04/02/17  . Sulfa Antibiotics Rash and Other (See Comments)    Blisters, also    Antimicrobials this admission: 11/29 vanc >> 11/29 merrem >>  Dose adjustments this admission:  Microbiology results: 11/29 wound : 11/29 bone : 11/29 MRSA PCR: negative  Thank you for allowing pharmacy to be a part of this patient's care.  Sherlon Handing, PharmD, BCPS Clinical pharmacist 06/13/2018, 11:51 AM

## 2018-06-13 NOTE — Progress Notes (Signed)
PT Cancellation Note  Patient Details Name: Patricia Mata MRN: [id number] DOB: [birth year]   Cancelled Treatment:    Reason Eval/Treat Not Completed: Attempted PT tx session-pt declined participation. Will check back another day.    Weston Anna, PT Acute Rehabilitation Services Pager: [phone] Office: [phone]

## 2018-06-14 DIAGNOSIS — A498 Other bacterial infections of unspecified site: Secondary | ICD-10-CM

## 2018-06-14 DIAGNOSIS — B964 Proteus (mirabilis) (morganii) as the cause of diseases classified elsewhere: Secondary | ICD-10-CM

## 2018-06-14 DIAGNOSIS — M86159 Other acute osteomyelitis, unspecified femur: Secondary | ICD-10-CM

## 2018-06-14 LAB — BASIC METABOLIC PANEL
Anion gap: 5 (ref 5–15)
BUN: 37 mg/dL — ABNORMAL HIGH (ref 8–23)
CO2: 24 mmol/L (ref 22–32)
Calcium: 8.8 mg/dL — ABNORMAL LOW (ref 8.9–10.3)
Chloride: 107 mmol/L (ref 98–111)
Creatinine, Ser: 0.99 mg/dL (ref 0.44–1.00)
GFR calc Af Amer: >60 mL/min (ref >60)
GFR calc non Af Amer: >60 mL/min (ref >60)
Glucose, Bld: 97 mg/dL (ref 70–99)
Potassium: 3.6 mmol/L (ref 3.5–5.1)
Sodium: 136 mmol/L (ref 135–145)

## 2018-06-14 LAB — CBC
HCT: 22.7 % — ABNORMAL LOW (ref 36.0–46.0)
Hemoglobin: 7.2 g/dL — ABNORMAL LOW (ref 12.0–15.0)
MCH: 29.3 pg (ref 26.0–34.0)
MCHC: 31.7 g/dL (ref 30.0–36.0)
MCV: 92.3 fL (ref 80.0–100.0)
Platelets: 234 10*3/uL (ref 150–400)
RBC: 2.46 MIL/uL — ABNORMAL LOW (ref 3.87–5.11)
RDW: 16.8 % — ABNORMAL HIGH (ref 11.5–15.5)
WBC: 8.5 10*3/uL (ref 4.0–10.5)
nRBC: 0 % (ref 0.0–0.2)

## 2018-06-14 LAB — MAGNESIUM: Magnesium: 2 mg/dL (ref 1.7–2.4)

## 2018-06-14 LAB — GLUCOSE, CAPILLARY
Glucose-Capillary: 118 mg/dL — ABNORMAL HIGH (ref 70–99)
Glucose-Capillary: 125 mg/dL — ABNORMAL HIGH (ref 70–99)
Glucose-Capillary: 153 mg/dL — ABNORMAL HIGH (ref 70–99)
Glucose-Capillary: 87 mg/dL (ref 70–99)
Glucose-Capillary: 92 mg/dL (ref 70–99)
Glucose-Capillary: 98 mg/dL (ref 70–99)

## 2018-06-14 LAB — PREPARE RBC (CROSSMATCH)

## 2018-06-14 MED ORDER — FUROSEMIDE 10 MG/ML IJ SOLN
20.0000 mg | Freq: Once | INTRAMUSCULAR | Status: AC
  Administered 2018-06-14: 20 mg via INTRAVENOUS
  Filled 2018-06-14: qty 2

## 2018-06-14 MED ORDER — SODIUM CHLORIDE 0.9 % IV SOLN
1.0000 g | INTRAVENOUS | Status: DC
  Administered 2018-06-14 – 2018-06-15 (×2): 1000 mg via INTRAVENOUS
  Filled 2018-06-14 (×2): qty 1

## 2018-06-14 MED ORDER — SODIUM CHLORIDE 0.9% IV SOLUTION: Freq: Once | INTRAVENOUS | Status: DC

## 2018-06-14 NOTE — Progress Notes (Signed)
PHARMACY CONSULT NOTE FOR: Ertapenem  OUTPATIENT  PARENTERAL ANTIBIOTIC THERAPY (OPAT)  Indication: Morganella coccygeal osteomyelitis Regimen: Ertapenem 1gm IV q24h End date: 07/24/2018  IV antibiotic discharge orders are pended. To discharging provider:  please sign these orders via discharge navigator,  Select New Orders & click on the button choice - Manage This Unsigned Work.     Thank you for allowing pharmacy to be a part of this patient's care.  Sherlon Handing, PharmD, BCPS Clinical pharmacist 06/14/2018, 2:26 PM

## 2018-06-14 NOTE — Progress Notes (Signed)
PROGRESS NOTE    [name]  OHY:[id number] DOB: October 12, 1955 DOA: 06/08/2018 PCP: Glendale Chard, MD  Brief Narrative:  Patricia Mata is a 62 y.o. female with medical history significant of severe pulmonary hypertension, colon cancer(in remission, no active tx), diabetes, hypertension, anemia, myelodysplastic syndrome, severe AS who presented with bleeding from her Sacral wound that developed after her ICU stay when she was admitted from 10/20 to 11/12.  She was discharged to rehab facility where she has been having blood clots found in the dressings no purulent discharge, no foul smell, noted no chills or fevers.  She requires transfusion of PRBC every 2 to 3 weeks secondary to MDS.  Patient was hospitalized due to severe anemia and bleeding from her sacral wound.  MRI was done which raised concern for coccygeal osteomyelitis.  Patient seen by infectious disease.  Plastic surgery was consulted and the patient underwent bone biopsy.  Patient started on vancomycin and meropenem.   Assessment & Plan:   Symptomatic anemia Multifactorial related to her hx of MDS (transfusion dependent, requires transfusions every few weeks per past notes) and with possible contribution from bleeding decubitus ulcer.  She was transfused 2 units of PRBC.  Hemoglobin improved to 9.1 from 6.9.  Noted to be trending down again.  Hemoglobin down to 7.2 this morning.  No overt bleeding noted.  We will transfuse her 2 units of blood with Lasix in between and after.  Ferritin is elevated likely due to history of multiple blood transfusion.    Sacral wound with bleeding This wound developed during hospitalization from 10/28 - 11/12.  General surgery was consulted due to bleeding.  Bleeding has subsided.  General surgery has signed off.  Local wound care.   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  Infectious disease was consulted.  Plastic surgery was consulted.  Appreciate Dr. Eusebio Friendly assistance.   Patient underwent bone biopsy 11/29.  Sent for culture.  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  Patient empirically started on vancomycin and meropenem by ID.  PICC line placed.     Hypothyroidism, newly diagnosed TSH in 60's, low free t4.  T3 was also low.  Patient was started on low-dose Synthroid due to her recent cardiac issues.  Possible side effect from amiodarone. Recommend rechecking thyroid function test in 3 weeks.  MDS (myelodysplastic syndrome) Follows with Dr. Alen Blew.  Transfusion dependent.  He's mentioned having hospice involved in the future and she'd like to defer this as long as possible.    Acute renal failure Creatinine was 2.15 at admission.  She was given IV fluids with improvement.  Creatinine was again elevated at 1.8 on 11/28 and she was given IV fluids.  Creatinine has improved.    Essential hypertension Continue metoprolol (transitioned from toprol xl as an outpatient), hydralazine.  Holding lasix and spironolactone.  Blood pressure reasonably well controlled for the most part.  Occasional high readings noted.  Hyperlipidemia  Continue statin  Severe Pulm Hypertension Continue sildenafil, currently holding lasix, follow closely.    Moderate AS/MS Not candidate for intervention given PAH  Chronic Atrial Fibrillation Not on anticoagulation given above.  Amiodarone 200 mg BID.  Heart rate is stable.  Hyponatremia  Sodium level stable.  Constipation  Patient had a large bowel movement yesterday.  Feels relieved.  Continue bowel regimen.  Diabetes mellitus type 2 with episodes of hypoglycemia HbA1c 6.3.  Hypoglycemia was most likely due to poor oral intake.  Has been stable for the last 2 days.  Continue to monitor.  Patient is on Lantus.    Hypomagnesemia Repleted on 11/30.  Normal this morning.    DVT prophylaxis: SCD Code Status: DNR Family Communication: Discussed with the patient Disposition Plan: Waiting on final cultures  and final ID recommendations.  Blood transfusion today.  Hopefully back to SNF in the next 1 to 2 days.   Consultants:   Surgery  Plastic surgery  Infectious disease  Procedures:   none   Antimicrobials:  Anti-infectives (From admission, onward)   Start     Dose/Rate Route Frequency Ordered Stop   06/14/18 1000  ertapenem (INVANZ) 1,000 mg in sodium chloride 0.9 % 100 mL IVPB     1 g 200 mL/hr over 30 Minutes Intravenous Every 24 hours 06/14/18 0919     06/13/18 2200  vancomycin (VANCOCIN) 1,250 mg in sodium chloride 0.9 % 250 mL IVPB  Status:  Discontinued     1,250 mg 166.7 mL/hr over 90 Minutes Intravenous Every 24 hours 06/13/18 1149 06/14/18 0918   06/13/18 1800  vancomycin (VANCOCIN) IVPB 750 mg/150 ml premix  Status:  Discontinued     750 mg 150 mL/hr over 60 Minutes Intravenous Every 24 hours 06/12/18 1750 06/13/18 1149   06/13/18 1600  meropenem (MERREM) 1 g in sodium chloride 0.9 % 100 mL IVPB  Status:  Discontinued     1 g 200 mL/hr over 30 Minutes Intravenous Every 8 hours 06/13/18 1153 06/14/18 0919   06/12/18 2000  meropenem (MERREM) 1 g in sodium chloride 0.9 % 100 mL IVPB  Status:  Discontinued     1 g 200 mL/hr over 30 Minutes Intravenous Every 12 hours 06/12/18 1750 06/13/18 1153   06/12/18 1715  vancomycin (VANCOCIN) 1,500 mg in sodium chloride 0.9 % 500 mL IVPB     1,500 mg 250 mL/hr over 120 Minutes Intravenous  Once 06/12/18 1709 06/13/18 0049   06/12/18 1645  metroNIDAZOLE (FLAGYL) tablet 500 mg  Status:  Discontinued     500 mg Oral Every 8 hours 06/12/18 1642 06/12/18 1750   06/12/18 1123  polymyxin B 500,000 Units, bacitracin 50,000 Units in sodium chloride 0.9 % 500 mL irrigation  Status:  Discontinued       As needed 06/12/18 1123 06/12/18 1133   06/09/18 1000  metroNIDAZOLE (FLAGYL) tablet 250 mg  Status:  Discontinued     250 mg Oral Daily 06/09/18 0401 06/11/18 1149   06/08/18 1745  clindamycin (CLEOCIN) IVPB 600 mg  Status:  Discontinued      600 mg 100 mL/hr over 30 Minutes Intravenous  Once 06/08/18 1731 06/08/18 1732   06/08/18 1745  vancomycin (VANCOCIN) 1,500 mg in sodium chloride 0.9 % 500 mL IVPB     1,500 mg 250 mL/hr over 120 Minutes Intravenous  Once 06/08/18 1740 06/08/18 2030     Subjective: Patient feels well.  She had a large bowel movement yesterday.  Denies any other new issues.  Pain is reasonably well controlled.  Objective: Vitals:   06/13/18 0519 06/13/18 1430 06/13/18 2043 06/14/18 0602  BP: (!) 118/37 120/66 (!) 155/53 (!) 165/45  Pulse: (!) 57 63 68 65  Resp: 16 16 18 18   Temp: 98.1 F (36.7 C) 98.6 F (37 C) 98.8 F (37.1 C) 98.8 F (37.1 C)  TempSrc: Oral Oral Oral Oral  SpO2: 98% 99% 100% 99%  Weight:      Height:        Intake/Output Summary (Last 24 hours) at 06/14/2018 1043 Last data  filed at 06/14/2018 0900 Gross per 24 hour  Intake 3001.89 ml  Output 1900 ml  Net 1101.89 ml   Filed Weights   06/08/18 1402  Weight: 84.8 kg    Examination:  General exam: Awake alert.  In no distress Respiratory system: Normal effort at rest.  Clear to auscultation bilaterally Cardiovascular system: S1-S2 is normal regular.  No S3-S4.  No rubs murmurs or bruit Gastrointestinal system: Abdomen is soft.  Nontender nondistended Central nervous system: No focal neurological deficits. Skin: Stage IV sacral decub.  Good granulation tissue was noted when last examined.    Data Reviewed: I have personally reviewed following labs and imaging studies  CBC: Recent Labs  Lab 06/08/18 1526  06/10/18 0456 06/11/18 0511 06/12/18 0504 06/13/18 0532 06/14/18 0444  WBC 12.0*   < > 9.9 8.9 9.1 8.4 8.5  NEUTROABS 9.3*  --   --   --   --   --   --   HGB 6.9*   < > 9.1* 8.3* 7.9* 7.6* 7.2*  HCT 21.7*   < > 27.6* 25.1* 24.6* 24.1* 22.7*  MCV 94.8   < > 92.0 91.3 92.8 92.3 92.3  PLT 339   < > 264 254 258 247 234   < > = values in this interval not displayed.   Basic Metabolic Panel: Recent Labs  Lab  06/09/18 0704 06/10/18 0456 06/11/18 0511 06/12/18 0504 06/13/18 0532 06/14/18 0444  NA 136 134* 132* 133* 138 136  K 4.3 4.0 3.9 3.9 3.8 3.6  CL 105 105 104 105 106 107  CO2 22 20* 19* 19* 23 24  GLUCOSE 100* 113* 142* 132* 95 97  BUN 58* 48* 52* 56* 44* 37*  CREATININE 1.58* 1.30* 1.89* 1.24* 0.97 0.99  CALCIUM 9.2 8.9 8.8* 9.0 8.9 8.8*  MG 1.4* 1.5*  --  1.2* 1.3* 2.0  PHOS 4.9*  --   --   --   --   --    GFR: Estimated Creatinine Clearance: 61.2 mL/min (by C-G formula based on SCr of 0.99 mg/dL). Liver Function Tests: Recent Labs  Lab 06/09/18 0704 06/10/18 0456  AST 43* 45*  ALT 30 32  ALKPHOS 110 116  BILITOT 2.1* 2.1*  PROT 7.0 6.6  ALBUMIN 2.9* 2.8*   CBG: Recent Labs  Lab 06/13/18 1629 06/13/18 1955 06/13/18 2346 06/14/18 0356 06/14/18 0747  GLUCAP 152* 119* 103* 98 92     Radiology Studies: [place] Ekg Site Rite  Result Date: 06/12/2018 If Site Rite image not attached, placement could not be confirmed due to current cardiac rhythm.     Scheduled Meds: . sodium chloride   Intravenous Once  . amiodarone  200 mg Oral BID  . atorvastatin  40 mg Oral Daily  . collagenase   Topical BID  . feeding supplement (GLUCERNA SHAKE)  237 mL Oral TID BM  . furosemide  20 mg Intravenous Once  . hydrALAZINE  100 mg Oral TID  . insulin aspart  0-9 Units Subcutaneous Q4H  . insulin glargine  14 Units Subcutaneous QPC breakfast  . latanoprost  1 drop Both Eyes QHS  . levothyroxine  25 mcg Oral Q0600  . metoprolol tartrate  12.5 mg Oral BID  . nutrition supplement (JUVEN)  1 packet Oral BID BM  . pantoprazole  40 mg Oral Daily  . polyethylene glycol  17 g Oral BID  . senna  2 tablet Oral QHS  . sildenafil  20 mg Oral TID  .  sodium chloride flush  10-40 mL Intracatheter Q12H   Continuous Infusions: . ertapenem    . lactated ringers Stopped (06/13/18 1758)  . magnesium sulfate 1 - 4 g bolus IVPB       LOS: 6 days    Bonnielee Haff, MD [facility] Pager [phone]   If 7PM-7AM, please contact night-coverage [email or web address] Password [email or web address] 06/14/2018, 10:43 AM

## 2018-06-14 NOTE — Progress Notes (Signed)
Patient refusing dressing changes for last evening due to frequent bowel movements causing soiling to the dressing. Last dressing change was at 19:30 by Tilda Burrow, RN before shift completion. Q8 dressing change refused as patient wants to sleep; will reattempt to change dressing in the morning. Will continue to monitor.

## 2018-06-14 NOTE — Plan of Care (Signed)
Patient in bed this morning. No complaints of pain noted at this time. Will continue to monitor.

## 2018-06-14 NOTE — Progress Notes (Signed)
Subjective: No new complaints  Antibiotics:  Anti-infectives (From admission, onward)   Start     Dose/Rate Route Frequency Ordered Stop   06/14/18 1000  ertapenem (INVANZ) 1,000 mg in sodium chloride 0.9 % 100 mL IVPB     1 g 200 mL/hr over 30 Minutes Intravenous Every 24 hours 06/14/18 0919     06/13/18 2200  vancomycin (VANCOCIN) 1,250 mg in sodium chloride 0.9 % 250 mL IVPB  Status:  Discontinued     1,250 mg 166.7 mL/hr over 90 Minutes Intravenous Every 24 hours 06/13/18 1149 06/14/18 0918   06/13/18 1800  vancomycin (VANCOCIN) IVPB 750 mg/150 ml premix  Status:  Discontinued     750 mg 150 mL/hr over 60 Minutes Intravenous Every 24 hours 06/12/18 1750 06/13/18 1149   06/13/18 1600  meropenem (MERREM) 1 g in sodium chloride 0.9 % 100 mL IVPB  Status:  Discontinued     1 g 200 mL/hr over 30 Minutes Intravenous Every 8 hours 06/13/18 1153 06/14/18 0919   06/12/18 2000  meropenem (MERREM) 1 g in sodium chloride 0.9 % 100 mL IVPB  Status:  Discontinued     1 g 200 mL/hr over 30 Minutes Intravenous Every 12 hours 06/12/18 1750 06/13/18 1153   06/12/18 1715  vancomycin (VANCOCIN) 1,500 mg in sodium chloride 0.9 % 500 mL IVPB     1,500 mg 250 mL/hr over 120 Minutes Intravenous  Once 06/12/18 1709 06/13/18 0049   06/12/18 1645  metroNIDAZOLE (FLAGYL) tablet 500 mg  Status:  Discontinued     500 mg Oral Every 8 hours 06/12/18 1642 06/12/18 1750   06/12/18 1123  polymyxin B 500,000 Units, bacitracin 50,000 Units in sodium chloride 0.9 % 500 mL irrigation  Status:  Discontinued       As needed 06/12/18 1123 06/12/18 1133   06/09/18 1000  metroNIDAZOLE (FLAGYL) tablet 250 mg  Status:  Discontinued     250 mg Oral Daily 06/09/18 0401 06/11/18 1149   06/08/18 1745  clindamycin (CLEOCIN) IVPB 600 mg  Status:  Discontinued     600 mg 100 mL/hr over 30 Minutes Intravenous  Once 06/08/18 1731 06/08/18 1732   06/08/18 1745  vancomycin (VANCOCIN) 1,500 mg in sodium chloride 0.9 % 500  mL IVPB     1,500 mg 250 mL/hr over 120 Minutes Intravenous  Once 06/08/18 1740 06/08/18 2030      Medications: Scheduled Meds: . sodium chloride   Intravenous Once  . amiodarone  200 mg Oral BID  . atorvastatin  40 mg Oral Daily  . collagenase   Topical BID  . feeding supplement (GLUCERNA SHAKE)  237 mL Oral TID BM  . furosemide  20 mg Intravenous Once  . hydrALAZINE  100 mg Oral TID  . insulin aspart  0-9 Units Subcutaneous Q4H  . insulin glargine  14 Units Subcutaneous QPC breakfast  . latanoprost  1 drop Both Eyes QHS  . levothyroxine  25 mcg Oral Q0600  . metoprolol tartrate  12.5 mg Oral BID  . nutrition supplement (JUVEN)  1 packet Oral BID BM  . pantoprazole  40 mg Oral Daily  . polyethylene glycol  17 g Oral BID  . senna  2 tablet Oral QHS  . sildenafil  20 mg Oral TID  . sodium chloride flush  10-40 mL Intracatheter Q12H   Continuous Infusions: . ertapenem 1,000 mg (06/14/18 1044)  . lactated ringers 10 mL/hr at 06/14/18 1043   PRN  Meds:.acetaminophen **OR** acetaminophen, bisacodyl, HYDROcodone-acetaminophen, milk and molasses, ondansetron **OR** ondansetron (ZOFRAN) IV, silver nitrate applicators, sodium chloride flush, traMADol    Objective: Weight change:   Intake/Output Summary (Last 24 hours) at 06/14/2018 1353 Last data filed at 06/14/2018 0900 Gross per 24 hour  Intake 2561.89 ml  Output 1900 ml  Net 661.89 ml   Blood pressure (!) 165/45, pulse 65, temperature 98.8 F (37.1 C), temperature source Oral, resp. rate 18, height 5\' 4"  (1.626 m), weight 84.8 kg, SpO2 99 %. Temp:  [98.6 F (37 C)-98.8 F (37.1 C)] 98.8 F (37.1 C) (12/01 0602) Pulse Rate:  [63-68] 65 (12/01 0602) Resp:  [16-18] 18 (12/01 0602) BP: (120-165)/(45-66) 165/45 (12/01 0602) SpO2:  [99 %-100 %] 99 % (12/01 0602)  Physical Exam: General: Alert and awake, oriented x3, not in any acute distress. HEENT: anicteric sclera, EOMI CVS regular rate, normal  Chest: , no wheezing, no  respiratory distress Abdomen: soft non-distended,  Extremities: no edema or deformity noted bilaterally Skin: Decubitus ulcer not examined Neuro: nonfocal  CBC:    BMET Recent Labs    06/13/18 0532 06/14/18 0444  NA 138 136  K 3.8 3.6  CL 106 107  CO2 23 24  GLUCOSE 95 97  BUN 44* 37*  CREATININE 0.97 0.99  CALCIUM 8.9 8.8*     Liver Panel  No results for input(s): PROT, ALBUMIN, AST, ALT, ALKPHOS, BILITOT, BILIDIR, IBILI in the last 72 hours.     Sedimentation Rate No results for input(s): ESRSEDRATE in the last 72 hours. C-Reactive Protein No results for input(s): CRP in the last 72 hours.  Micro Results: Recent Results (from the past 720 hour(s))  MRSA PCR Screening     Status: None   Collection Time: 06/12/18  3:22 AM  Result Value Ref Range Status   MRSA by PCR NEGATIVE NEGATIVE Final    Comment:        The GeneXpert MRSA Assay (FDA approved for NASAL specimens only), is one component of a comprehensive MRSA colonization surveillance program. It is not intended to diagnose MRSA infection nor to guide or monitor treatment for MRSA infections. Performed at [facility], [place] [address], [place], [place] [address]   Aerobic/Anaerobic Culture (surgical/deep wound)     Status: None (Preliminary result)   Collection Time: 06/12/18 11:36 AM  Result Value Ref Range Status   Specimen Description   Final    BONE COCCYGEAL Performed at [facility] [address], [place], [place] [address]    Special Requests   Final    NONE Performed at [facility], [place] [address], [place], [place] [address]    Gram Stain NO WBC SEEN NO ORGANISMS SEEN   Final   Culture   Final    RARE MORGANELLA MORGANII CULTURE REINCUBATED FOR BETTER GROWTH Performed at [facility], [place] [address], [place], [place] [address]    Report Status PENDING  Incomplete  Aerobic/Anaerobic Culture (surgical/deep wound)      Status: None (Preliminary result)   Collection Time: 06/12/18 11:42 AM  Result Value Ref Range Status   Specimen Description   Final    TISSUE SOFT TISSUE SACRAL Performed at [facility] [address] [place], [place] [address]    Special Requests   Final    NONE Performed at [facility], [place] [address], [place], [place] [address]    Gram Stain   Final  FEW WBC PRESENT,BOTH PMN AND MONONUCLEAR NO ORGANISMS SEEN Performed at [facility], [place] [address], [place], [place] [address]    Culture FEW [facility] MORGANII  Final   Report Status PENDING  Incomplete   Organism ID, Bacteria MORGANELLA MORGANII  Final      Susceptibility   Morganella morganii - MIC*    AMPICILLIN >=32 RESISTANT Resistant     CEFAZOLIN >=64 RESISTANT Resistant     CEFEPIME <=1 SENSITIVE Sensitive     CEFTAZIDIME <=1 SENSITIVE Sensitive     CEFTRIAXONE <=1 SENSITIVE Sensitive     CIPROFLOXACIN <=0.25 SENSITIVE Sensitive     GENTAMICIN <=1 SENSITIVE Sensitive     IMIPENEM 2 SENSITIVE Sensitive     TRIMETH/SULFA 160 RESISTANT Resistant     AMPICILLIN/SULBACTAM 16 INTERMEDIATE Intermediate     PIP/TAZO <=4 SENSITIVE Sensitive     * FEW MORGANELLA MORGANII    Studies/Results: [place] Ekg Site Rite  Result Date: 06/12/2018 If Site Rite image not attached, placement could not be confirmed due to current cardiac rhythm.     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 female with decubitus ulcer after  protracted hospital stay and stay in skilled nursing facility admitted for bleeding from the ulcer seen by surgery then found on MRI to have evidence of early coccygeal osteomyelitis.  I am grateful that Dr. Marla Roe performed I&D of biopsy of bone that is been sent for culture --> Morganella  Morganella coccygeal osteomyelitis  Given her severe allergies will stay with a CARBAPENEM but will switch to once a day Invanz.  Would give her 6 weeks of therapy.  I will place updated OPA T consult.  We will plan on seeing her in early January.  I will otherwise sign off for now please call with further questions.   LOS: 6 days   Alcide Evener 06/14/2018, 1:53 PM

## 2018-06-14 NOTE — Progress Notes (Signed)
Diagnosis: Coccygeal osteomyelitis  Culture Result: Morganella  Allergies  Allergen Reactions  . Ancef [Cefazolin] Itching    Severe itching- after procedure, ancef was the antibiotic.-04/02/17  . Sulfa Antibiotics Rash and Other (See Comments)    Blisters, also    OPAT Orders Discharge antibiotics: Invanz 1 g daily  Duration: 6 weeks.  End Date: July 24, 2018  [facility] Care Per Protocol:  Labs weekly while on IV antibiotics: _x_ CBC with differential _x_ BMP  _x_ CRP _x_ ESR   x__ Please pull PIC at completion of IV antibiotics __ Please leave PIC in place until doctor has seen patient or been notified  Fax weekly labs to [phone]  Clinic Follow Up Appt:  Early January

## 2018-06-15 ENCOUNTER — Telehealth: Payer: Self-pay | Admitting: *Deleted

## 2018-06-15 DIAGNOSIS — M86159 Other acute osteomyelitis, unspecified femur: Secondary | ICD-10-CM

## 2018-06-15 LAB — TYPE AND SCREEN
ABO/RH(D): A NEG
Antibody Screen: NEGATIVE
Unit division: 0
Unit division: 0

## 2018-06-15 LAB — CBC
HCT: 26.1 % — ABNORMAL LOW (ref 36.0–46.0)
Hemoglobin: 8.8 g/dL — ABNORMAL LOW (ref 12.0–15.0)
MCH: 30.1 pg (ref 26.0–34.0)
MCHC: 33.7 g/dL (ref 30.0–36.0)
MCV: 89.4 fL (ref 80.0–100.0)
Platelets: 229 10*3/uL (ref 150–400)
RBC: 2.92 MIL/uL — ABNORMAL LOW (ref 3.87–5.11)
RDW: 16.5 % — ABNORMAL HIGH (ref 11.5–15.5)
WBC: 9.7 10*3/uL (ref 4.0–10.5)
nRBC: 0 % (ref 0.0–0.2)

## 2018-06-15 LAB — BPAM RBC
Blood Product Expiration Date: 201912192359
Blood Product Expiration Date: 201912202359
ISSUE DATE / TIME: 201912011512
ISSUE DATE / TIME: 201912012047
Unit Type and Rh: 600
Unit Type and Rh: 600

## 2018-06-15 LAB — GLUCOSE, CAPILLARY
Glucose-Capillary: 84 mg/dL (ref 70–99)
Glucose-Capillary: 87 mg/dL (ref 70–99)
Glucose-Capillary: 130 mg/dL — ABNORMAL HIGH (ref 70–99)

## 2018-06-15 LAB — BASIC METABOLIC PANEL
Anion gap: 9 (ref 5–15)
BUN: 38 mg/dL — ABNORMAL HIGH (ref 8–23)
CO2: 23 mmol/L (ref 22–32)
Calcium: 9 mg/dL (ref 8.9–10.3)
Chloride: 106 mmol/L (ref 98–111)
Creatinine, Ser: 1.07 mg/dL — ABNORMAL HIGH (ref 0.44–1.00)
GFR calc Af Amer: >60 mL/min (ref >60)
GFR calc non Af Amer: 55 mL/min — ABNORMAL LOW (ref >60)
Glucose, Bld: 94 mg/dL (ref 70–99)
Potassium: 3.6 mmol/L (ref 3.5–5.1)
Sodium: 138 mmol/L (ref 135–145)

## 2018-06-15 LAB — AEROBIC/ANAEROBIC CULTURE W GRAM STAIN (SURGICAL/DEEP WOUND)

## 2018-06-15 LAB — AEROBIC/ANAEROBIC CULTURE (SURGICAL/DEEP WOUND)

## 2018-06-15 MED ORDER — HYDRALAZINE HCL 20 MG/ML IJ SOLN: 10.0000 mg | Freq: Once | INTRAMUSCULAR | Status: AC

## 2018-06-15 MED ORDER — SENNA 8.6 MG PO TABS: 2.0000 | ORAL_TABLET | Freq: Every day | ORAL | 0 refills | Status: DC

## 2018-06-15 MED ORDER — TRAMADOL HCL 50 MG PO TABS: 50.0000 mg | ORAL_TABLET | Freq: Every day | ORAL | 0 refills | Status: DC

## 2018-06-15 MED ORDER — POLYETHYLENE GLYCOL 3350 17 G PO PACK: 17.0000 g | PACK | Freq: Two times a day (BID) | ORAL | 0 refills | Status: DC

## 2018-06-15 MED ORDER — HYDRALAZINE HCL 20 MG/ML IJ SOLN
INTRAMUSCULAR | Status: AC
  Administered 2018-06-15: 14:00:00
  Filled 2018-06-15: qty 1

## 2018-06-15 MED ORDER — LEVOTHYROXINE SODIUM 25 MCG PO TABS: 25.0000 ug | ORAL_TABLET | Freq: Every day | ORAL | Status: DC

## 2018-06-15 MED ORDER — HEPARIN SOD (PORK) LOCK FLUSH 100 UNIT/ML IV SOLN
250.0000 [IU] | INTRAVENOUS | Status: AC | PRN
  Administered 2018-06-15: 250 [IU]

## 2018-06-15 MED ORDER — OXYCODONE-ACETAMINOPHEN 2.5-325 MG PO TABS: 1.0000 | ORAL_TABLET | Freq: Three times a day (TID) | ORAL | 0 refills | Status: DC | PRN

## 2018-06-15 MED ORDER — POTASSIUM CHLORIDE CRYS ER 20 MEQ PO TBCR: 20.0000 meq | EXTENDED_RELEASE_TABLET | Freq: Once | ORAL | 5 refills | Status: DC

## 2018-06-15 MED ORDER — FUROSEMIDE 40 MG PO TABS: 40.0000 mg | ORAL_TABLET | Freq: Every day | ORAL | 5 refills | Status: DC

## 2018-06-15 MED ORDER — ERTAPENEM IV (FOR PTA / DISCHARGE USE ONLY): 1.0000 g | INTRAVENOUS | 0 refills | Status: DC

## 2018-06-15 NOTE — Telephone Encounter (Signed)
Received TC from pt's sister. She states that patient was discharged from [facility] yesterday after having been admitted for 1 week. Pt's sister states that pt received 2 untis of blood yesterday while she was an in-patient, therefore is requesting to cancel tomorrow's appts for lab/transfusion. Next appt is for 06/30/18. Pt's sister is aware of this. Tomorrow's appts are cancelled.

## 2018-06-15 NOTE — Discharge Summary (Signed)
[facility]  Physician Discharge Summary   Patient ID: Patricia Mata MRN: [id number] DOB/AGE: September 12, 1955 62 y.o.  Admit date: 06/08/2018 Discharge date: 06/15/2018  PCP: Glendale Chard, MD  DISCHARGE DIAGNOSES:  Symptomatic anemia status post transfusion Anemia in the setting of her myelodysplastic syndrome Sacral wound bleeding, resolved Coccygeal osteomyelitis requiring long-term IV antibiotics Newly diagnosed hypothyroidism Chronic atrial fibrillation not on anticoagulation Moderate aortic and mitral stenosis Diabetes mellitus type 2 Constipation, resolved   RECOMMENDATIONS FOR OUTPATIENT FOLLOW UP: 1. Check CBC and basic metabolic panel on a weekly basis starting this Thursday 2. Check TSH and free T4 in 3 weeks 3. Patient to follow-up with Dr. Drucilla Schmidt with infectious disease on 07/24/17, as mentioned under follow-up below   DISCHARGE CONDITION: fair  Diet recommendation: Modified carbohydrate  Filed Weights   06/08/18 1402  Weight: 84.8 kg    INITIAL HISTORY: Patricia Mata a 62 y.o.femalewith medical history significant of severe pulmonary hypertension,colon cancer(in remission, no active tx), diabetes, hypertension, anemia,myelodysplastic syndrome,severe AS who presented with bleeding from her Sacral wound thatdeveloped after her ICU stay when she was admitted from 10/20 to 11/12.  She was discharged to rehab facility where she has been having blood clots found in the dressings no purulent discharge, no foul smell,noted no chills or fevers.  She requires transfusion of PRBC every 2 to 3 weeks secondary to MDS.  Patient was hospitalized due to severe anemia and bleeding from her sacral wound.  MRI was done which raised concern for coccygeal osteomyelitis.  Patient seen by infectious disease.  Plastic surgery was consulted and the patient underwent bone biopsy.  Patient started on vancomycin and meropenem.   Consultants:   Surgery  Plastic  surgery  Infectious disease  Procedures:  Bone biopsy done by Dr. Corinna Lines COURSE:   Symptomatic anemia Multifactorial related to her hx of MDS (transfusion dependent, requires transfusions every few weeks per past notes) and with possible contribution from bleeding decubitus ulcer.  She was transfused 2 units of PRBC.  Hemoglobin improved to 9.1 from 6.9.    Hemoglobin started trending down again.  She did not have any further bleeding from her sacral wound.  She was transfused 2 additional units of PRBC on 12/1.  Hemoglobin has responded appropriately.  We will continue to recommend checking her blood counts on a weekly basis.  She will benefit from outpatient transfusions as determined by her hematologist.  Ferritin is elevated likely due to history of multiple blood transfusion.    Sacral wound with bleeding This wound developed during hospitalization from 10/28 - 11/12.  General surgery was consulted due to bleeding.  Bleeding has subsided.  General surgery has signed off.  Local wound care.   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  Infectious disease was consulted.  Plastic surgery was consulted.  Appreciate Dr. Eusebio Friendly assistance.  Patient underwent bone biopsy 11/29.  Sent for culture.    Cultures are growing Morganella morganii.  Patient was initially started on vancomycin and meropenem.  Based on culture data she has been switched over to ertapenem which is to be continued till January 10.  PICC line has been placed.     Hypothyroidism, newly diagnosed TSH in 60's, low free t4.  T3 was also low.  Patient was started on low-dose Synthroid due to her recent cardiac issues.  Possible side effect from amiodarone. Recommend rechecking thyroid function test in 3 weeks.  MDS (myelodysplastic syndrome) Follows with Dr. Alen Blew.  Transfusion dependent.  He's mentioned having hospice involved in the future and she'd like to defer this as long as  possible.    Acute renal failure Creatinine was 2.15 at admission.  She was given IV fluids with improvement.  Creatinine was again elevated at 1.8 on 11/28 and she was given IV fluids.  Creatinine has improved.   Monitor labs periodically.   Essential hypertension Continue home medications.  Hyperlipidemia Continue statin  Severe Pulm Hypertension Continue sildenafil.  Will be discharged on lower dose of Lasix.  Also noted to be on spironolactone.  Moderate AS/MS Not candidate for intervention given PAH  Chronic Atrial Fibrillation Not on anticoagulation given above.  Amiodarone 200 mg BID.  Heart rate is stable.  Hyponatremia Sodium level stable.  Constipation Patient had a large bowel movement 12/1.  Feels relieved.  Continue bowel regimen.  Diabetes mellitus type 2 with episodes of hypoglycemia HbA1c 6.3.  Hypoglycemia was most likely due to poor oral intake.  Has been stable for the last 2 days.  Continue to monitor.  Patient is on Lantus.    Hypomagnesemia Repleted.  Overall stable.  Okay for discharge back to her skilled nursing facility today.     PERTINENT LABS:  The results of significant diagnostics from this hospitalization (including imaging, microbiology, ancillary and laboratory) are listed below for reference.    Microbiology: Recent Results (from the past 240 hour(s))  MRSA PCR Screening     Status: None   Collection Time: 06/12/18  3:22 AM  Result Value Ref Range Status   MRSA by PCR NEGATIVE NEGATIVE Final    Comment:        The GeneXpert MRSA Assay (FDA approved for NASAL specimens only), is one component of a comprehensive MRSA colonization surveillance program. It is not intended to diagnose MRSA infection nor to guide or monitor treatment for MRSA infections. Performed at [facility], [place] [address], [place], [place] [address]   Aerobic/Anaerobic Culture (surgical/deep wound)     Status: None  (Preliminary result)   Collection Time: 06/12/18 11:36 AM  Result Value Ref Range Status   Specimen Description   Final    BONE COCCYGEAL Performed at [facility] [address], [place], [place] [address]    Special Requests   Final    NONE Performed at [facility], [place] [address] [place], [place] [address]    Gram Stain NO WBC SEEN NO ORGANISMS SEEN   Final   Culture   Final    RARE MORGANELLA MORGANII CULTURE REINCUBATED FOR BETTER GROWTH Performed at [facility], [place] [address], [place], [place] [address]    Report Status PENDING  Incomplete  Aerobic/Anaerobic Culture (surgical/deep wound)     Status: None (Preliminary result)   Collection Time: 06/12/18 11:42 AM  Result Value Ref Range Status   Specimen Description   Final    TISSUE SOFT TISSUE SACRAL Performed at [facility] [address] [place], [place] [address]    Special Requests   Final    NONE Performed at [facility], [place] [address] [place], [place] [address]    Gram Stain   Final    FEW WBC PRESENT,BOTH PMN AND MONONUCLEAR NO ORGANISMS SEEN Performed at [facility], [place] [address], [place], [place] [address]    Culture FEW [employer] MORGANII  Final   Report Status PENDING  Incomplete   Organism ID, Bacteria MORGANELLA MORGANII  Final  Susceptibility   Morganella morganii - MIC*    AMPICILLIN >=32 RESISTANT Resistant     CEFAZOLIN >=64 RESISTANT Resistant     CEFEPIME <=1 SENSITIVE Sensitive     CEFTAZIDIME <=1 SENSITIVE Sensitive     CEFTRIAXONE <=1 SENSITIVE Sensitive     CIPROFLOXACIN <=0.25 SENSITIVE Sensitive     GENTAMICIN <=1 SENSITIVE Sensitive     IMIPENEM 2 SENSITIVE Sensitive     TRIMETH/SULFA 160 RESISTANT Resistant     AMPICILLIN/SULBACTAM 16 INTERMEDIATE Intermediate     PIP/TAZO <=4 SENSITIVE Sensitive     * FEW MORGANELLA MORGANII     Labs: Basic Metabolic Panel: Recent Labs   Lab 06/09/18 0704 06/10/18 0456 06/11/18 0511 06/12/18 0504 06/13/18 0532 06/14/18 0444 06/15/18 0419  NA 136 134* 132* 133* 138 136 138  K 4.3 4.0 3.9 3.9 3.8 3.6 3.6  CL 105 105 104 105 106 107 106  CO2 22 20* 19* 19* '23 24 23  ' GLUCOSE 100* 113* 142* 132* 95 97 94  BUN 58* 48* 52* 56* 44* 37* 38*  CREATININE 1.58* 1.30* 1.89* 1.24* 0.97 0.99 1.07*  CALCIUM 9.2 8.9 8.8* 9.0 8.9 8.8* 9.0  MG 1.4* 1.5*  --  1.2* 1.3* 2.0  --   PHOS 4.9*  --   --   --   --   --   --    Liver Function Tests: Recent Labs  Lab 06/09/18 0704 06/10/18 0456  AST 43* 45*  ALT 30 32  ALKPHOS 110 116  BILITOT 2.1* 2.1*  PROT 7.0 6.6  ALBUMIN 2.9* 2.8*   CBC: Recent Labs  Lab 06/08/18 1526  06/11/18 0511 06/12/18 0504 06/13/18 0532 06/14/18 0444 06/15/18 0419  WBC 12.0*   < > 8.9 9.1 8.4 8.5 9.7  NEUTROABS 9.3*  --   --   --   --   --   --   HGB 6.9*   < > 8.3* 7.9* 7.6* 7.2* 8.8*  HCT 21.7*   < > 25.1* 24.6* 24.1* 22.7* 26.1*  MCV 94.8   < > 91.3 92.8 92.3 92.3 89.4  PLT 339   < > 254 258 247 234 229   < > = values in this interval not displayed.   CBG: Recent Labs  Lab 06/14/18 1617 06/14/18 2027 06/14/18 2342 06/15/18 0403 06/15/18 0741  GLUCAP 153* 125* 87 84 87     IMAGING STUDIES Ct Pelvis Wo Contrast  Result Date: 06/08/2018 CLINICAL DATA:  Initial evaluation for sacral decubitus ulcer. EXAM: CT PELVIS WITHOUT CONTRAST TECHNIQUE: Multidetector CT imaging of the pelvis was performed following the standard protocol without intravenous contrast. COMPARISON:  Prior CT from 05/08/2017. FINDINGS: Urinary Tract: Punctate nonobstructive calculi noted within the partially visualized lower pole the right kidney. Visualized kidneys otherwise unremarkable. Visualized ureters of normal caliber without abnormality. Bladder moderately distended without acute abnormality. Bowel: Anastomotic suture present at the right colon. Visualized bowels of normal caliber without evidence for  obstruction. Moderate to large volume retained stool within the distal colon and rectal vault, suggesting constipation. No acute inflammatory changes about the visualized bowels. Mild colonic diverticulosis without evidence for acute diverticulitis. Vascular/Lymphatic: Extensive aorto bi-iliac atherosclerotic disease with extensive vascular calcifications throughout the pelvic vasculature. No adenopathy. Reproductive: Multiple calcifications within the uterus likely reflect small calcified fibroids. Uterus and ovaries otherwise within normal limits. Other: No free air or fluid. Small irregular fat containing paraumbilical hernia with associated suture. Musculoskeletal: Mild diffuse anasarca. Visualized musculature of the pelvis and upper  thighs atrophic in appearance. Sacral decubitus ulcer seen at the lower midline (series 10, image 119). Ulceration extends to the level of the underlying coccyx. No osseous erosion or periosteal reaction to suggest osteomyelitis within the underlying coccyx. No loculated collections identified on this noncontrast examination. No abnormal soft tissue emphysema. No acute osseous abnormality. No discrete lytic or blastic osseous lesions. Degenerative changes noted within the visualized lower lumbar spine. IMPRESSION: 1. Sacral decubitus ulcer at the mid posterior pelvis, extending to the level of the underlying coccyx. No evidence for associated osteomyelitis by CT. No discrete or drainable collections identified. 2. Moderate to large volume stool within the visualized colon, consistent with constipation. 3. Nonobstructive right renal nephrolithiasis. 4. Extensive atherosclerosis. Electronically Signed   By: Jeannine Boga M.D.   On: 06/08/2018 18:17   Mr Pelvis W Wo Contrast  Result Date: 06/09/2018 CLINICAL DATA:  Sacral decubitus ulcer.  Evaluate for osteomyelitis. EXAM: MRI PELVIS WITHOUT AND WITH CONTRAST TECHNIQUE: Multiplanar multisequence MR imaging of the pelvis was  performed both before and after administration of intravenous contrast. CONTRAST:  8 mL Gadavist intravenous contrast. COMPARISON:  CT pelvis from yesterday. FINDINGS: Urinary Tract:  No abnormality visualized. Bowel:  Unremarkable visualized pelvic bowel loops. Vascular/Lymphatic: No pathologically enlarged lymph nodes. No significant vascular abnormality seen. Reproductive:  Small uterine fibroids again noted.  No adnexal mass. Other:  None. Musculoskeletal: Midline sacral decubitus ulcer extending to the coccyx. Marrow edema within the coccyx with graying of the posterior cortex and faintly decreased T1 marrow signal. No abscess. Mild presacral soft tissue edema. Mild edema within the bilateral gluteus maximus muscles adjacent to the sacrum, likely reactive. No fluid collection. IMPRESSION: 1. Midline sacral decubitus ulcer extending to the coccyx with evidence of early coccygeal osteomyelitis. No abscess. Electronically Signed   By: Titus Dubin M.D.   On: 06/09/2018 15:14   [place] Ekg Site Rite  Result Date: 06/12/2018 If Site Rite image not attached, placement could not be confirmed due to current cardiac rhythm.    DISCHARGE EXAMINATION: Vitals:   06/14/18 2259 06/14/18 2357 06/14/18 2359 06/15/18 0552  BP: (!) 171/50 (!) 178/48 (!) 176/48 (!) 137/42  Pulse: 68 68 68 (!) 59  Resp: '18 18  18  ' Temp: 99.4 F (37.4 C) 98.7 F (37.1 C)  98.9 F (37.2 C)  TempSrc: Oral Oral  Oral  SpO2: 100% 100%  99%  Weight:      Height:       General appearance: alert, cooperative, appears stated age and no distress Resp: clear to auscultation bilaterally Cardio: regular rate and rhythm, S1, S2 normal, no murmur, click, rub or gallop GI: soft, non-tender; bowel sounds normal; no masses,  no organomegaly  DISPOSITION: SNF  Discharge Instructions    Call MD for:  difficulty breathing, headache or visual disturbances   Complete by:  As directed    Call MD for:  extreme fatigue   Complete by:  As  directed    Call MD for:  persistant dizziness or light-headedness   Complete by:  As directed    Call MD for:  persistant nausea and vomiting   Complete by:  As directed    Call MD for:  severe uncontrolled pain   Complete by:  As directed    Call MD for:  temperature >100.4   Complete by:  As directed    Discharge instructions   Complete by:  As directed    Please see instructions on the discharge summary.  You  were cared for by a hospitalist during your hospital stay. If you have any questions about your discharge medications or the care you received while you were in the hospital after you are discharged, you can call the unit and asked to speak with the hospitalist on call if the hospitalist that took care of you is not available. Once you are discharged, your primary care physician will handle any further medical issues. Please note that NO REFILLS for any discharge medications will be authorized once you are discharged, as it is imperative that you return to your primary care physician (or establish a relationship with a primary care physician if you do not have one) for your aftercare needs so that they can reassess your need for medications and monitor your lab values. If you do not have a primary care physician, you can call [phone] for a physician referral.   Home infusion instructions [place] May follow [place] Dosing Protocol; May administer Cathflo as needed to maintain patency of vascular access device.; Flushing of vascular access device: per [facility] Protocol: 0.9% NaCl pre/post medica...   Complete by:  As directed    Instructions:  May follow [place] Dosing Protocol   Instructions:  May administer Cathflo as needed to maintain patency of vascular access device.   Instructions:  Flushing of vascular access device: per [facility] Protocol: 0.9% NaCl pre/post medication administration and prn patency; Heparin 100 u/ml, 61m for implanted ports and Heparin 10u/ml, 560mfor  all other central venous catheters.   Instructions:  May follow [facility] Anaphylaxis Protocol for First Dose Administration in the home: 0.9% NaCl at 25-50 ml/hr to maintain IV access for protocol meds. Epinephrine 0.3 ml IV/IM PRN and Benadryl 25-50 IV/IM PRN s/s of anaphylaxis.   Instructions:  [facility] Coordinator (RN) to assist per patient IV care needs in the home PRN.   Increase activity slowly   Complete by:  As directed         Allergies as of 06/15/2018      Reactions   Ancef [cefazolin] Itching   Severe itching- after procedure, ancef was the antibiotic.-04/02/17   Sulfa Antibiotics Rash, Other (See Comments)   Blisters, also      Medication List    STOP taking these medications   Gerhardt's butt cream Crea   lidocaine-prilocaine cream Commonly known as:  EMLA   metroNIDAZOLE 250 MG tablet Commonly known as:  FLAGYL     TAKE these medications   amiodarone 200 MG tablet Commonly known as:  PACERONE Take 1 tablet (200 mg total) by mouth 2 (two) times daily.   atorvastatin 40 MG tablet Commonly known as:  LIPITOR Take 1 tablet (40 mg total) by mouth daily.   CENTRUM SILVER 50+WOMEN Tabs Take 1 tablet by mouth daily.   collagenase ointment Commonly known as:  SANTYL Apply topically daily.   ertapenem  IVPB Commonly known as:  INVANZ Inject 1 g into the vein daily. Indication:  Morganella coccygeal osteomyelitis Last Day of Therapy:  07/24/2018 Labs - Once weekly:  CBC/D and BMP, Labs - Every other week:  ESR and CRP   feeding supplement (GLUCERNA SHAKE) Liqd Take 237 mLs by mouth 3 (three) times daily between meals.   furosemide 40 MG tablet Commonly known as:  LASIX Take 1 tablet (40 mg total) by mouth daily. What changed:  when to take this   hydrALAZINE 100 MG tablet Commonly known as:  APRESOLINE Take 1 tablet (100 mg  total) by mouth 3 (three) times daily.   insulin glargine 100 unit/mL Sopn Commonly known as:  LANTUS Inject 0.14  mLs (14 Units total) into the skin daily after breakfast.   Insulin Pen Needle 29G X 5MM Misc Use as directed   JANUMET XR 50-500 MG Tb24 Generic drug:  SitaGLIPtin-MetFORMIN HCl Take 1 tablet by mouth daily. With evening meal   latanoprost 0.005 % ophthalmic solution Commonly known as:  XALATAN Place 1 drop into both eyes at bedtime.   levothyroxine 25 MCG tablet Commonly known as:  SYNTHROID, LEVOTHROID Take 1 tablet (25 mcg total) by mouth daily at 6 (six) AM. Start taking on:  06/16/2018   metoprolol succinate 25 MG 24 hr tablet Commonly known as:  TOPROL-XL Take 1 tablet (25 mg total) by mouth 2 (two) times daily.   mupirocin ointment 2 % Commonly known as:  BACTROBAN Place 1 application into the nose daily.   oxycodone-acetaminophen 2.5-325 MG tablet Commonly known as:  PERCOCET Take 1 tablet by mouth every 8 (eight) hours as needed for pain. What changed:  when to take this   pantoprazole 40 MG tablet Commonly known as:  PROTONIX Take 1 tablet (40 mg total) by mouth daily.   polyethylene glycol packet Commonly known as:  MIRALAX / GLYCOLAX Take 17 g by mouth 2 (two) times daily.   potassium chloride SA 20 MEQ tablet Commonly known as:  K-DUR,KLOR-CON Take 1 tablet (20 mEq total) by mouth once for 1 dose. What changed:  when to take this   senna 8.6 MG Tabs tablet Commonly known as:  SENOKOT Take 2 tablets (17.2 mg total) by mouth at bedtime.   sildenafil 20 MG tablet Commonly known as:  REVATIO Take 1 tablet (20 mg total) by mouth 3 (three) times daily.   sodium hypochlorite external solution Commonly known as:  DAKIN'S 1/2 STRENGTH Irrigate with 1 application as directed daily.   spironolactone 25 MG tablet Commonly known as:  ALDACTONE Take 0.5 tablets (12.5 mg total) by mouth daily.   traMADol 50 MG tablet Commonly known as:  ULTRAM Take 1 tablet (50 mg total) by mouth daily.            Home Infusion Instuctions  (From admission, onward)          Start     Ordered   06/15/18 0000  Home infusion instructions [facility] May follow [place] Dosing Protocol; May administer Cathflo as needed to maintain patency of vascular access device.; Flushing of vascular access device: per [facility] Protocol: 0.9% NaCl pre/post medica...    Question Answer Comment  Instructions May follow [place] Dosing Protocol   Instructions May administer Cathflo as needed to maintain patency of vascular access device.   Instructions Flushing of vascular access device: per [facility] Protocol: 0.9% NaCl pre/post medication administration and prn patency; Heparin 100 u/ml, 33m for implanted ports and Heparin 10u/ml, 532mfor all other central venous catheters.   Instructions May follow [facility] Anaphylaxis Protocol for First Dose Administration in the home: 0.9% NaCl at 25-50 ml/hr to maintain IV access for protocol meds. Epinephrine 0.3 ml IV/IM PRN and Benadryl 25-50 IV/IM PRN s/s of anaphylaxis.   Instructions [facility] Infusion Coordinator (RN) to assist per patient IV care needs in the home PRN.      06/15/18 0844            Contact information for follow-up providers    [facility]  In 3 weeks.   Contact information: [address] [phone] [place] Follow up.   Specialty:  Infectious Diseases Why:  07/24/17 @ 9:30 am. Please call our office if you are not able to make this appointment.  Contact information: [address] [place] [address] [phone]            Contact information for after-discharge care    Destination    [facility] Preferred SNF .   Service:  Skilled Nursing Contact information: [address] [place] [phone]                  TOTAL DISCHARGE TIME: 71 mins  Bonnielee Haff  [facility] Pager  [phone]  06/15/2018, 10:45 AM

## 2018-06-15 NOTE — NC FL2 (Addendum)
[place] LEVEL OF CARE SCREENING TOOL     IDENTIFICATION  Patient Name: Patricia Mata Birthdate: April 25, 1956 Sex: female Admission Date (Current Location): 06/08/2018  [facility] and [place] Number:  Herbalist and Address:  [facility],  [place] [address], [place]      Provider Number: [id number]  Attending Physician Name and Address:  Bonnielee Haff, MD  Relative Name and Phone Number:       Current Level of Care: Hospital Recommended Level of Care: [place] Prior Approval Number:    Date Approved/Denied:   PASRR Number: [id number]  Discharge Plan: SNF    Current Diagnoses: Patient Active Problem List   Diagnosis Date Noted  . Acute osteomyelitis of pelvic region and thigh ([place])   . 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 Decubitus ulcer of coccygeal region, stage 4 ([place])   . Sacral wound 06/08/2018  . Iron deficiency anemia due to chronic blood loss 06/08/2018  . Acute renal failure (ARF) ([place]) 06/08/2018  . Hyponatremia 06/08/2018  . Constipation 06/08/2018  . Palliative care encounter   . Pressure injury of skin 05/22/2018  . Acute kidney injury (nontraumatic) ([place])   . Lactic acidosis   . Acute hypoxemic respiratory failure ([place])   . Cardiogenic shock ([place])   . Atrial fibrillation with rapid ventricular response ([place]) 05/11/2018  . Symptomatic anemia 06/14/2017  . Severe aortic stenosis 06/10/2017  . Carotid artery disease ([place]) 06/10/2017  . Essential hypertension 05/13/2017  . Hyperlipidemia 05/13/2017  . Bilateral lower extremity edema 05/13/2017  . Port-A-Cath in place 04/28/2017  . MDS (myelodysplastic syndrome) ([place]) 03/20/2017  . Goals of care, counseling/discussion 03/20/2017  . Anemia in chronic kidney disease 05/10/2016  . Anemia in stage 1 chronic kidney disease 05/10/2016  . Anemia 02/09/2016  . 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 Must stay off the wound at all times.                       Personal Care Assistance Level of Assistance  Bathing, Feeding, Dressing Bathing Assistance: Maximum assistance Feeding assistance: Independent Dressing Assistance: Maximum assistance     Functional Limitations Info  Sight, Hearing, Speech Sight Info: Adequate Hearing Info: Adequate Speech Info: Adequate    SPECIAL CARE FACTORS FREQUENCY        PT Frequency: 2X/WEEK OT Frequency: 2X/WEEK            Contractures Contractures Info: Not present    Additional Factors Info  Code Status, Allergies Code Status Info: DNR Allergies Info: Allergies: Ancef Cefazolin, Sulfa Antibiotics           Current Medications (06/15/2018):  This is the current hospital active medication list Current Facility-Administered Medications  Medication Dose Route Frequency Provider Last Rate Last Dose  . 0.9 %  sodium chloride infusion (Manually program via Guardrails IV Fluids)   Intravenous Once Bonnielee Haff, MD   Stopped at 06/14/18 1046  . acetaminophen (TYLENOL) tablet 650  mg  650 mg Oral Q6H PRN Toy Baker, MD       Or  . acetaminophen (TYLENOL) suppository 650 mg  650 mg Rectal Q6H PRN Doutova, Anastassia, MD      . amiodarone (PACERONE) tablet 200 mg  200 mg Oral BID Toy Baker, MD   200 mg at 06/15/18 0924  . atorvastatin (LIPITOR) tablet 40 mg  40 mg Oral Daily Toy Baker, MD   40 mg at 06/15/18 0924  . bisacodyl (DULCOLAX) suppository 10 mg  10 mg Rectal Daily PRN  Toy Baker, MD      . collagenase (SANTYL) ointment   Topical BID Meuth, Brooke A, PA-C      . ertapenem (INVANZ) 1,000 mg in sodium chloride 0.9 % 100 mL IVPB  1 g Intravenous Q24H Tommy Medal, Lavell Islam, MD 200 mL/hr at 06/15/18 0929 1,000 mg at 06/15/18 0929  . feeding supplement (GLUCERNA SHAKE) (GLUCERNA SHAKE) liquid 237 mL  237 mL Oral TID BM Bonnielee Haff, MD   237 mL at 06/15/18 0924  . hydrALAZINE (APRESOLINE) tablet 100 mg  100 mg Oral TID Toy Baker, MD   100 mg at 06/15/18 0923  . HYDROcodone-acetaminophen (NORCO/VICODIN) 5-325 MG per tablet 1-2 tablet  1-2 tablet Oral Q4H PRN Toy Baker, MD   1 tablet at 06/13/18 1718  . insulin aspart (novoLOG) injection 0-9 Units  0-9 Units Subcutaneous Q4H Toy Baker, MD   1 Units at 06/14/18 2030  . insulin glargine (LANTUS) injection 14 Units  14 Units Subcutaneous QPC breakfast Toy Baker, MD   14 Units at 06/15/18 0922  . lactated ringers infusion   Intravenous Continuous Bonnielee Haff, MD 10 mL/hr at 06/15/18 [phone]    . latanoprost (XALATAN) 0.005 % ophthalmic solution 1 drop  1 drop Both Eyes QHS Doutova, Anastassia, MD   1 drop at 06/14/18 2306  . levothyroxine (SYNTHROID, LEVOTHROID) tablet 25 mcg  25 mcg Oral Q0600 Elodia [place]., MD   25 mcg at 06/15/18 [phone]  . metoprolol tartrate (LOPRESSOR) tablet 12.5 mg  12.5 mg Oral BID Toy Baker, MD   12.5 mg at 06/15/18 0923  . milk and molasses enema  1 enema Rectal PRN Toy Baker, MD      . nutrition supplement (JUVEN) (JUVEN) powder packet 1 packet  1 packet Oral BID BM Bonnielee Haff, MD   1 packet at 06/15/18 [phone]  . ondansetron (ZOFRAN) tablet 4 mg  4 mg Oral Q6H PRN Doutova, Anastassia, MD       Or  . ondansetron (ZOFRAN) injection 4 mg  4 mg Intravenous Q6H PRN Doutova, Anastassia, MD      . pantoprazole (PROTONIX) EC tablet 40 mg  40 mg Oral Daily Doutova, Anastassia, MD   40 mg at 06/15/18 0924  . polyethylene  glycol (MIRALAX / GLYCOLAX) packet 17 g  17 g Oral BID Toy Baker, MD   17 g at 06/15/18 0925  . senna (SENOKOT) tablet 17.2 mg  2 tablet Oral QHS Bonnielee Haff, MD   17.2 mg at 06/14/18 2305  . sildenafil (REVATIO) tablet 20 mg  20 mg Oral TID Toy Baker, MD   20 mg at 06/15/18 0924  . silver nitrate applicators applicator 10 application  10 application Topical PRN Meuth, Brooke A, PA-C      . sodium chloride flush (NS) 0.9 % injection 10-40 mL  10-40 mL Intracatheter Q12H Bonnielee Haff, MD   10 mL at 06/13/18 2143  . sodium chloride flush (  NS) 0.9 % injection 10-40 mL  10-40 mL Intracatheter PRN Bonnielee Haff, MD      . traMADol Veatrice [place]) tablet 50 mg  50 mg Oral Q6H PRN Minda Ditto, RPH       Facility-Administered Medications Ordered in Other Encounters  Medication Dose Route Frequency Provider Last Rate Last Dose  . sodium chloride flush (NS) 0.9 % injection 10 mL  10 mL Intravenous PRN [name], MD         Discharge Medications: Please see discharge summary for a list of discharge medications.  Relevant Imaging Results:  Relevant Lab Results:   Additional Information SSN: [id number]    IV Ivanz 1g daily for 6 weeks  Lia Hopping, LCSW

## 2018-06-15 NOTE — Clinical Social Work Placement (Signed)
CSW confirmed IV antibiotics to be given at [facility] arranged for transport Nurse given the number to call report.    CLINICAL SOCIAL WORK PLACEMENT  NOTE  Date:  06/15/2018  Patient Details  Name: Patricia Mata MRN: [id number] Date of Birth: 1955-12-13  Clinical Social Work is seeking post-discharge placement for this patient at the [place] level of care (*CSW will initial, date and re-position this form in  chart as items are completed):  Yes   Patient/family provided with [place] Work Department's list of facilities offering this level of care within the geographic area requested by the patient (or if unable, by the patient's family).  Yes   Patient/family informed of their freedom to choose among providers that offer the needed level of care, that participate in Medicare, Medicaid or managed care program needed by the patient, have an available bed and are willing to accept the patient.  Yes   Patient/family informed of [facility]'s ownership interest in [facility] and [facility], as well as of the fact that they are under no obligation to receive care at these facilities.  PASRR submitted to EDS on       PASRR number received on       Existing PASRR number confirmed on 06/15/18     FL2 transmitted to all facilities in geographic area requested by pt/family on       FL2 transmitted to all facilities within larger geographic area on       Patient informed that his/her managed care company has contracts with or will negotiate with certain facilities, including the following:  [facility]     Yes   Patient/family informed of bed offers received.  Patient chooses bed at [facility]     Physician recommends and patient chooses bed at      Patient to be transferred to [facility] on 06/15/18.  Patient to be transferred to facility by PTAR     Patient family notified on 06/15/18 of transfer.  Name of  family member notified:  Robley Fries, Spouse     PHYSICIAN       Additional Comment:    _______________________________________________ Lia Hopping, LCSW 06/15/2018, 1:39 PM

## 2018-06-16 ENCOUNTER — Inpatient Hospital Stay: Payer: BC Managed Care – PPO

## 2018-06-16 NOTE — Addendum Note (Signed)
Encounter addended by: Shirley Friar, PA-C on: 06/16/2018 9:40 AM  Actions taken: LOS modified

## 2018-06-18 ENCOUNTER — Other Ambulatory Visit (HOSPITAL_COMMUNITY): Payer: Self-pay | Admitting: Internal Medicine

## 2018-06-21 LAB — AEROBIC/ANAEROBIC CULTURE W GRAM STAIN (SURGICAL/DEEP WOUND): Gram Stain: NONE SEEN

## 2018-06-22 ENCOUNTER — Ambulatory Visit (HOSPITAL_COMMUNITY)
Admission: RE | Admit: 2018-06-22 | Discharge: 2018-06-22 | Disposition: A | Discharge status: Home / Self Care | Payer: BC Managed Care – PPO | Source: Ambulatory Visit | Attending: Cardiology | Admitting: Cardiology

## 2018-06-22 ENCOUNTER — Encounter (HOSPITAL_COMMUNITY): Payer: Self-pay

## 2018-06-22 VITALS — BP 150/68 | HR 56 | Wt 185.2 lb

## 2018-06-22 DIAGNOSIS — Z794 Long term (current) use of insulin: Secondary | ICD-10-CM | POA: Insufficient documentation

## 2018-06-22 DIAGNOSIS — I272 Pulmonary hypertension, unspecified: Secondary | ICD-10-CM

## 2018-06-22 DIAGNOSIS — G35 Multiple sclerosis: Secondary | ICD-10-CM | POA: Insufficient documentation

## 2018-06-22 DIAGNOSIS — A498 Other bacterial infections of unspecified site: Secondary | ICD-10-CM

## 2018-06-22 DIAGNOSIS — Z8249 Family history of ischemic heart disease and other diseases of the circulatory system: Secondary | ICD-10-CM | POA: Insufficient documentation

## 2018-06-22 DIAGNOSIS — Z881 Allergy status to other antibiotic agents status: Secondary | ICD-10-CM | POA: Diagnosis not present

## 2018-06-22 DIAGNOSIS — Z79899 Other long term (current) drug therapy: Secondary | ICD-10-CM | POA: Insufficient documentation

## 2018-06-22 DIAGNOSIS — I2721 Secondary pulmonary arterial hypertension: Secondary | ICD-10-CM | POA: Insufficient documentation

## 2018-06-22 DIAGNOSIS — I110 Hypertensive heart disease with heart failure: Secondary | ICD-10-CM | POA: Diagnosis not present

## 2018-06-22 DIAGNOSIS — E875 Hyperkalemia: Secondary | ICD-10-CM | POA: Diagnosis not present

## 2018-06-22 DIAGNOSIS — I5032 Chronic diastolic (congestive) heart failure: Secondary | ICD-10-CM | POA: Diagnosis not present

## 2018-06-22 DIAGNOSIS — N179 Acute kidney failure, unspecified: Secondary | ICD-10-CM | POA: Diagnosis not present

## 2018-06-22 DIAGNOSIS — I480 Paroxysmal atrial fibrillation: Secondary | ICD-10-CM | POA: Diagnosis not present

## 2018-06-22 DIAGNOSIS — M4628 Osteomyelitis of vertebra, sacral and sacrococcygeal region: Secondary | ICD-10-CM | POA: Diagnosis not present

## 2018-06-22 DIAGNOSIS — E1122 Type 2 diabetes mellitus with diabetic chronic kidney disease: Secondary | ICD-10-CM | POA: Insufficient documentation

## 2018-06-22 DIAGNOSIS — Z87891 Personal history of nicotine dependence: Secondary | ICD-10-CM | POA: Diagnosis not present

## 2018-06-22 DIAGNOSIS — D469 Myelodysplastic syndrome, unspecified: Secondary | ICD-10-CM | POA: Diagnosis not present

## 2018-06-22 DIAGNOSIS — Z79890 Hormone replacement therapy (postmenopausal): Secondary | ICD-10-CM | POA: Diagnosis not present

## 2018-06-22 DIAGNOSIS — E669 Obesity, unspecified: Secondary | ICD-10-CM | POA: Diagnosis not present

## 2018-06-22 DIAGNOSIS — Z882 Allergy status to sulfonamides status: Secondary | ICD-10-CM | POA: Insufficient documentation

## 2018-06-22 DIAGNOSIS — Z85038 Personal history of other malignant neoplasm of large intestine: Secondary | ICD-10-CM | POA: Insufficient documentation

## 2018-06-22 DIAGNOSIS — L89154 Pressure ulcer of sacral region, stage 4: Secondary | ICD-10-CM

## 2018-06-22 NOTE — Patient Instructions (Addendum)
Labs done today  CANCELLED Dr. Gwenlyn Found appointment for 07/01/18  Please check blood pressure twice daily and call [phone] if blood pressure is greater than 271 systolic.

## 2018-06-22 NOTE — Progress Notes (Signed)
Advanced Heart Failure Clinic Note   Referring Physician: PCP: Glendale Chard, MD PCP-Cardiologist: Quay Burow, MD   HPI:  Patricia Mata is a 62 y.o. 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 Followed by Dr Gwenlyn Found for "severe" AS and pulmonary HTN.  Admitted on 10/27 with AF with RVR and recurrent anemia. Developed respiratory failure and intubated. She was transfused and placed on IV amio. Subsequently developed AKI/ESRD and placed on CVVHD. Converted to NSR on amio and weaned off the vent. Renal function now back to normal and off CVVHD. Transitioned to PO amiodarone after appropriate time. She was not put on [facility] due to chronic anemia requiring transfusions.  Echoshowed normal LVEF 65-70% with moderate AS and moderate MS and severe PH with RVSP 55mHG. She is not a candidate for intervention of AS or MS with advanced PAH.  Underwent R/LHC that showed normal coronaries and severely elevated PA pressures. Full results below. AHF team was consulted for further management of [place] V/Q scan was negative. She was started on sildenafil and tolerated well. She diuresed with IV lasix and transitioned to lasix 40 mg PO BID. She did not require O2 at discharge.   She required a total of 9 units of pRBCs while admitted. She requires transfusions every 2-3 weeks as an outpatient. She also developed a buttock wound. WOC was consulted and recommended hydrotherapy.   Palliative care was consulted and decision was made to transition to DNR. Yellow DNR form signed for DC. PT and OT recommended SNF at discharge. She will have palliative at SNF with possible transition to hospice eventually.  Pt admitted 11/25 - 12/2 with symptomatic anemia requiring transfusion. (in the setting of MDS requiring transfusions every 2-3 weeks chronically). 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 ID followed and started on vanc/meropenem. Switched to ertapenem via PICC line to continue until 07/24/18.   She presents today for post hospital follow up. She is feeling well today. Weight stable since initial hospital discharge with volume overload. [facility] She did not get her afternoon hydralazine due to travel for this appointment. Watching salt and fluid intake. She denies any drainage or problems with her PICC line. Denies fever, chills, CP, or palpitations. Sister is here present. They remain very optimistic about healing. She will be on IV ABX until 07/24/18. She has ID follow up that day.   Review of systems complete and found to be negative unless listed in HPI.    Past Medical History:  Diagnosis Date  . Cancer ([facility] 2008   Colon   . Diabetes mellitus without complication ([facility]   . Hypertension   . Macrocytic anemia     Current Outpatient Medications  Medication Sig Dispense Refill  . amiodarone (PACERONE) 200 MG tablet Take 1 tablet (200 mg total) by mouth 2 (two) times daily. 60 tablet 3  . atorvastatin (LIPITOR) 40 MG tablet Take 1 tablet (40 mg total) by mouth daily. 90 tablet 0  . collagenase (SANTYL) ointment Apply topically daily. 15 g 0  . ertapenem (INVANZ) IVPB Inject 1 g into the vein daily. Indication:  Morganella coccygeal osteomyelitis Last Day of Therapy:  07/24/2018 Labs - Once weekly:  CBC/D and BMP, Labs - Every other week:  ESR and CRP 40 Units 0  . feeding supplement, GLUCERNA  SHAKE, (GLUCERNA SHAKE) LIQD Take 237 mLs by mouth 3 (three) times daily between meals. (Patient not taking: Reported on 06/08/2018) 90 Can 0  . furosemide (LASIX) 40 MG tablet Take 1 tablet (40 mg total) by mouth daily. 60 tablet 5  . hydrALAZINE (APRESOLINE) 100 MG tablet Take 1 tablet (100 mg total) by mouth 3 (three) times daily. 90 tablet 6  . insulin glargine (LANTUS)  100 unit/mL SOPN Inject 0.14 mLs (14 Units total) into the skin daily after breakfast. 15 mL 11  . Insulin Pen Needle 29G X 5MM MISC Use as directed 200 each 0  . JANUMET XR 50-500 MG TB24 Take 1 tablet by mouth daily. With evening meal  2  . latanoprost (XALATAN) 0.005 % ophthalmic solution Place 1 drop into both eyes at bedtime.     Marland Kitchen levothyroxine (SYNTHROID, LEVOTHROID) 25 MCG tablet Take 1 tablet (25 mcg total) by mouth daily at 6 (six) AM.    . metoprolol succinate (TOPROL-XL) 25 MG 24 hr tablet Take 1 tablet (25 mg total) by mouth 2 (two) times daily. 30 tablet 5  . Multiple Vitamins-Minerals (CENTRUM SILVER 50+WOMEN) TABS Take 1 tablet by mouth daily.    . mupirocin ointment (BACTROBAN) 2 % Place 1 application into the nose daily.    Marland Kitchen oxycodone-acetaminophen (PERCOCET) 2.5-325 MG tablet Take 1 tablet by mouth every 8 (eight) hours as needed for pain. 15 tablet 0  . pantoprazole (PROTONIX) 40 MG tablet Take 1 tablet (40 mg total) by mouth daily. 30 tablet 5  . polyethylene glycol (MIRALAX / GLYCOLAX) packet Take 17 g by mouth 2 (two) times daily. 14 each 0  . potassium chloride SA (K-DUR,KLOR-CON) 20 MEQ tablet Take 1 tablet (20 mEq total) by mouth once for 1 dose. 60 tablet 5  . senna (SENOKOT) 8.6 MG TABS tablet Take 2 tablets (17.2 mg total) by mouth at bedtime. 120 each 0  . sildenafil (REVATIO) 20 MG tablet Take 1 tablet (20 mg total) by mouth 3 (three) times daily. 90 tablet 5  . sodium hypochlorite (DAKIN'S 1/2 STRENGTH) external solution Irrigate with 1 application as directed daily.    Marland Kitchen spironolactone (ALDACTONE) 25 MG tablet Take 0.5 tablets (12.5 mg total) by mouth daily. 15 tablet 5  . traMADol (ULTRAM) 50 MG tablet Take 1 tablet (50 mg total) by mouth daily. 30 tablet 0   No current facility-administered medications for this encounter.    Facility-Administered Medications Ordered in Other Encounters  Medication Dose Route Frequency Provider Last Rate Last Dose  . sodium  chloride flush (NS) 0.9 % injection 10 mL  10 mL Intravenous PRN [name], MD        Allergies  Allergen Reactions  . Ancef [Cefazolin] Itching    Severe itching- after procedure, ancef was the antibiotic.-04/02/17  . Sulfa Antibiotics Rash and Other (See Comments)    Blisters, also      Social History   Socioeconomic History  . Marital status: Married    Spouse name: Not on file  . Number of children: Not on file  . Years of education: Not on file  . Highest education level: Not on file  Occupational History  . Not on file  Social Needs  . Financial resource strain: Not on file  . Food insecurity:    Worry: Not on file    Inability: Not on file  . Transportation needs:    Medical: Not on file    Non-medical: Not on file  Tobacco Use  . Smoking status: Former Smoker    Types: Cigarettes    Last attempt to quit: 01/31/2001    Years since quitting: 17.4  . Smokeless tobacco: Former Network engineer and Sexual Activity  . Alcohol use: Yes    Alcohol/week: 2.0 standard drinks    Types: 2 Shots of liquor per week  . Drug use: Never  . Sexual activity: Not on file  Lifestyle  . Physical activity:    Days per week: Not on file    Minutes per session: Not on file  . Stress: Not on file  Relationships  . Social connections:    Talks on phone: Not on file    Gets together: Not on file    Attends religious service: Not on file    Active member of club or organization: Not on file    Attends meetings of clubs or organizations: Not on file    Relationship status: Not on file  . Intimate partner violence:    Fear of current or ex partner: Not on file    Emotionally abused: Not on file    Physically abused: Not on file    Forced sexual activity: Not on file  Other Topics Concern  . Not on file  Social History Narrative  . Not on file      Family History  Problem Relation Age of Onset  . Hypertension Mother   . Heart attack Father   . Hypertension Sister     . Hypertension Brother   . Hypertension Sister   . Hypertension Sister   . Prostate cancer Brother   . HIV/AIDS Brother     Vitals:   06/22/18 1434  BP: (!) 150/68  Pulse: (!) 56  SpO2: 99%  Weight: 84 kg (185 lb 3.2 oz)     Wt Readings from Last 3 Encounters:  06/22/18 84 kg (185 lb 3.2 oz)  06/08/18 84.8 kg (187 lb)  06/04/18 85.1 kg (187 lb 9.6 oz)    PHYSICAL EXAM: General: Well appearing. No resp difficulty. HEENT: Normal Neck: Supple. JVP 6-7 cm. Carotids 2+ bilat; no bruits. No thyromegaly or nodule noted. Cor: PMI nondisplaced. RRR, No M/G/R noted Lungs: CTAB, normal effort. Abdomen: Soft, non-tender, non-distended, no HSM. No bruits or masses. +BS  Extremities: No cyanosis, clubbing, or rash. Trace ankle edema. Neuro: Alert & orientedx3, cranial nerves grossly intact. moves all 4 extremities w/o difficulty. Affect pleasant   ASSESSMENT & PLAN:  1. PAH - Echo 05/12/18 LVEF 65-70%, Grade 2 DD, Mod AS, Mild/Mod MS, Mod TR, Trivial PI, PA peak pressure 93.  - L/RHC 05/21/18 with no significant CAD, severe pulmonary HTN with PAP 62 mmHg, PVR 5.4 WU.  - Severe, likely longstanding with NYHAI IIIB-IV - Suspect multifactorial in nature suspect mostly WHO group II (diastolic HF and valvular disease) and III (OSA) but given degree of PH cannot exclude WHO Group I component. PVR 5.4 WU - She is likely end stage. Will focus on volume and symptom management.  - VQ negative - PFTs and assess need for home O2 - Continue sildenafil 20 mg TID cautiously. Tolerating ok - Volume status stable on exam.  - Continue lasix 40 mg daily. Can take additional 40 mg as needed.   2. Moderate AS/MS - She is not candidate for intervention with advanced PAH. No change.   3. PAF - Remains in NSR on amio. Did not tolerate AF well at all. - Continue amiodarone 200 mg BID for  now. - Continue Toprol XL 25 mg BID as tolerated.  - Would try and avoid anticoagulation with transfusion  dependent anemia. (chronic).  - CHA2DS2/VASc is at least 4.   4. AKI - Cr stable at 1.07 06/15/18. Refused BMET today. Will obtain from ID labs.   5. 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 Total of 7 uPRBCs given since 05/11/18. - Hgb 8.8 06/15/18. Refuses CBC today. She gets Weekly labs via ID with ABX.   6. Buttock wound - Wound care following at facility.  - She is on IV ABX for coccygeal osteomyelitis as below.   7. HTN - Continue hydral 100 mg TID.   8. Hyperkalemia - K 3.6 06/15/18. Refuses today via peripheral stick. She gets weekly labs while on IV ABX.   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  - ID and plastic surgery consulted. Patient underwent bone biopsy 11/29. Sent for culture.   Cultures grew  Morganella morganii.  Patient was initially started on vancomycin and meropenem.   - Based on culture data she has been switched over to ertapenem which is to be continued till January 10 via PICC line.   She is doing very well from a breathing and volume perspective. Primary issue currently is her infection, which is being managed expertly by ID. RTC 6 weeks. Sooner with symptoms.   Shirley Friar, PA-C 06/22/18   Greater than 50% of the 25 minute visit was spent in counseling/coordination of care regarding disease state education, salt/fluid restriction, sliding scale diuretics, and medication compliance.

## 2018-06-23 ENCOUNTER — Telehealth (HOSPITAL_COMMUNITY): Payer: Self-pay

## 2018-06-23 NOTE — Telephone Encounter (Signed)
Spoke to Murray from [facility] and requested that she fax [place] the results of pt's weekly lab draws plus also draw a BMET per [facility]. She was aware, agreeable and verbalized understanding

## 2018-06-30 ENCOUNTER — Other Ambulatory Visit: Payer: Self-pay | Admitting: Oncology

## 2018-06-30 ENCOUNTER — Inpatient Hospital Stay: Payer: BC Managed Care – PPO | Attending: Oncology

## 2018-06-30 ENCOUNTER — Inpatient Hospital Stay: Payer: BC Managed Care – PPO

## 2018-06-30 DIAGNOSIS — N189 Chronic kidney disease, unspecified: Secondary | ICD-10-CM

## 2018-06-30 DIAGNOSIS — D631 Anemia in chronic kidney disease: Secondary | ICD-10-CM

## 2018-06-30 DIAGNOSIS — D649 Anemia, unspecified: Secondary | ICD-10-CM | POA: Diagnosis not present

## 2018-06-30 DIAGNOSIS — D469 Myelodysplastic syndrome, unspecified: Secondary | ICD-10-CM | POA: Insufficient documentation

## 2018-06-30 LAB — CBC WITH DIFFERENTIAL (CANCER CENTER ONLY)
Abs Immature Granulocytes: 0.02 10*3/uL (ref 0.00–0.07)
Basophils Absolute: 0 10*3/uL (ref 0.0–0.1)
Basophils Relative: 0 %
Eosinophils Absolute: 0.1 10*3/uL (ref 0.0–0.5)
Eosinophils Relative: 2 %
HCT: 22 % — ABNORMAL LOW (ref 36.0–46.0)
Hemoglobin: 7.1 g/dL — ABNORMAL LOW (ref 12.0–15.0)
Immature Granulocytes: 0 %
Lymphocytes Relative: 18 %
Lymphs Abs: 0.9 10*3/uL (ref 0.7–4.0)
MCH: 29.6 pg (ref 26.0–34.0)
MCHC: 32.3 g/dL (ref 30.0–36.0)
MCV: 91.7 fL (ref 80.0–100.0)
Monocytes Absolute: 0.9 10*3/uL (ref 0.1–1.0)
Monocytes Relative: 19 %
Neutro Abs: 2.9 10*3/uL (ref 1.7–7.7)
Neutrophils Relative %: 61 %
Platelet Count: 236 10*3/uL (ref 150–400)
RBC: 2.4 MIL/uL — ABNORMAL LOW (ref 3.87–5.11)
RDW: 16.6 % — ABNORMAL HIGH (ref 11.5–15.5)
WBC Count: 4.8 10*3/uL (ref 4.0–10.5)
nRBC: 0 % (ref 0.0–0.2)

## 2018-06-30 LAB — CMP (CANCER CENTER ONLY)
ALT: 28 U/L (ref 0–44)
AST: 39 U/L (ref 15–41)
Albumin: 2.8 g/dL — ABNORMAL LOW (ref 3.5–5.0)
Alkaline Phosphatase: 111 U/L (ref 38–126)
Anion gap: 10 (ref 5–15)
BUN: 24 mg/dL — ABNORMAL HIGH (ref 8–23)
CO2: 24 mmol/L (ref 22–32)
Calcium: 9.6 mg/dL (ref 8.9–10.3)
Chloride: 101 mmol/L (ref 98–111)
Creatinine: 1.15 mg/dL — ABNORMAL HIGH (ref 0.44–1.00)
GFR, Est AFR Am: 59 mL/min — ABNORMAL LOW (ref >60)
GFR, Estimated: 51 mL/min — ABNORMAL LOW (ref >60)
Glucose, Bld: 73 mg/dL (ref 70–99)
Potassium: 4.4 mmol/L (ref 3.5–5.1)
Sodium: 135 mmol/L (ref 135–145)
Total Bilirubin: 1.1 mg/dL (ref 0.3–1.2)
Total Protein: 7.3 g/dL (ref 6.5–8.1)

## 2018-06-30 LAB — SAMPLE TO BLOOD BANK

## 2018-06-30 LAB — PREPARE RBC (CROSSMATCH)

## 2018-06-30 MED ORDER — ACETAMINOPHEN 325 MG PO TABS
650.0000 mg | ORAL_TABLET | Freq: Once | ORAL | Status: AC
  Administered 2018-06-30: 650 mg via ORAL

## 2018-06-30 MED ORDER — FUROSEMIDE 10 MG/ML IJ SOLN
20.0000 mg | Freq: Once | INTRAMUSCULAR | Status: AC
  Administered 2018-06-30: 20 mg via INTRAVENOUS

## 2018-06-30 MED ORDER — HEPARIN SOD (PORK) LOCK FLUSH 100 UNIT/ML IV SOLN
250.0000 [IU] | INTRAVENOUS | Status: DC | PRN
  Filled 2018-06-30: qty 5

## 2018-06-30 MED ORDER — SODIUM CHLORIDE 0.9% IV SOLUTION
250.0000 mL | Freq: Once | INTRAVENOUS | Status: AC
  Administered 2018-06-30: 250 mL via INTRAVENOUS
  Filled 2018-06-30: qty 250

## 2018-06-30 MED ORDER — DIPHENHYDRAMINE HCL 25 MG PO CAPS
25.0000 mg | ORAL_CAPSULE | Freq: Once | ORAL | Status: AC
  Administered 2018-06-30: 25 mg via ORAL

## 2018-06-30 MED ORDER — SODIUM CHLORIDE 0.9% FLUSH
10.0000 mL | INTRAVENOUS | Status: DC | PRN
  Filled 2018-06-30: qty 10

## 2018-06-30 MED ORDER — DIPHENHYDRAMINE HCL 25 MG PO CAPS
ORAL_CAPSULE | ORAL | Status: AC
  Filled 2018-06-30: qty 1

## 2018-06-30 MED ORDER — ACETAMINOPHEN 325 MG PO TABS
ORAL_TABLET | ORAL | Status: AC
  Filled 2018-06-30: qty 2

## 2018-06-30 MED ORDER — FUROSEMIDE 10 MG/ML IJ SOLN
INTRAMUSCULAR | Status: AC
  Filled 2018-06-30: qty 2

## 2018-06-30 NOTE — Patient Instructions (Signed)

## 2018-07-01 ENCOUNTER — Other Ambulatory Visit (HOSPITAL_COMMUNITY): Payer: Self-pay

## 2018-07-01 ENCOUNTER — Ambulatory Visit: Payer: BC Managed Care – PPO | Admitting: Cardiovascular Disease

## 2018-07-01 LAB — TYPE AND SCREEN
ABO/RH(D): A NEG
Antibody Screen: NEGATIVE
Unit division: 0
Unit division: 0

## 2018-07-01 LAB — BPAM RBC
Blood Product Expiration Date: 201912282359
Blood Product Expiration Date: 201912282359
ISSUE DATE / TIME: 201912171155
ISSUE DATE / TIME: 201912171155
Unit Type and Rh: 600
Unit Type and Rh: 600

## 2018-07-10 ENCOUNTER — Ambulatory Visit: Payer: Self-pay | Admitting: Nurse Practitioner

## 2018-07-14 ENCOUNTER — Inpatient Hospital Stay: Payer: BC Managed Care – PPO

## 2018-07-14 ENCOUNTER — Telehealth: Payer: Self-pay | Admitting: Oncology

## 2018-07-14 ENCOUNTER — Inpatient Hospital Stay (HOSPITAL_BASED_OUTPATIENT_CLINIC_OR_DEPARTMENT_OTHER): Payer: BC Managed Care – PPO | Admitting: Oncology

## 2018-07-14 VITALS — BP 175/48 | HR 50 | Temp 98.2°F | Resp 18

## 2018-07-14 DIAGNOSIS — D631 Anemia in chronic kidney disease: Secondary | ICD-10-CM

## 2018-07-14 DIAGNOSIS — I10 Essential (primary) hypertension: Secondary | ICD-10-CM

## 2018-07-14 DIAGNOSIS — D469 Myelodysplastic syndrome, unspecified: Secondary | ICD-10-CM

## 2018-07-14 DIAGNOSIS — D649 Anemia, unspecified: Secondary | ICD-10-CM | POA: Diagnosis not present

## 2018-07-14 DIAGNOSIS — N189 Chronic kidney disease, unspecified: Secondary | ICD-10-CM

## 2018-07-14 DIAGNOSIS — I350 Nonrheumatic aortic (valve) stenosis: Secondary | ICD-10-CM | POA: Diagnosis not present

## 2018-07-14 DIAGNOSIS — Z85038 Personal history of other malignant neoplasm of large intestine: Secondary | ICD-10-CM

## 2018-07-14 DIAGNOSIS — M869 Osteomyelitis, unspecified: Secondary | ICD-10-CM

## 2018-07-14 LAB — FUNGUS CULTURE WITH STAIN

## 2018-07-14 LAB — CMP (CANCER CENTER ONLY)
ALT: 29 U/L (ref 0–44)
AST: 36 U/L (ref 15–41)
Albumin: 2.9 g/dL — ABNORMAL LOW (ref 3.5–5.0)
Alkaline Phosphatase: 130 U/L — ABNORMAL HIGH (ref 38–126)
Anion gap: 11 (ref 5–15)
BUN: 27 mg/dL — ABNORMAL HIGH (ref 8–23)
CO2: 22 mmol/L (ref 22–32)
Calcium: 9.6 mg/dL (ref 8.9–10.3)
Chloride: 104 mmol/L (ref 98–111)
Creatinine: 1.22 mg/dL — ABNORMAL HIGH (ref 0.44–1.00)
GFR, Est AFR Am: 55 mL/min — ABNORMAL LOW (ref >60)
GFR, Estimated: 47 mL/min — ABNORMAL LOW (ref >60)
Glucose, Bld: 113 mg/dL — ABNORMAL HIGH (ref 70–99)
Potassium: 4.2 mmol/L (ref 3.5–5.1)
Sodium: 137 mmol/L (ref 135–145)
Total Bilirubin: 1 mg/dL (ref 0.3–1.2)
Total Protein: 7.4 g/dL (ref 6.5–8.1)

## 2018-07-14 LAB — CBC WITH DIFFERENTIAL (CANCER CENTER ONLY)
Abs Immature Granulocytes: 0.03 10*3/uL (ref 0.00–0.07)
Basophils Absolute: 0 10*3/uL (ref 0.0–0.1)
Basophils Relative: 0 %
Eosinophils Absolute: 0.1 10*3/uL (ref 0.0–0.5)
Eosinophils Relative: 1 %
HCT: 24.9 % — ABNORMAL LOW (ref 36.0–46.0)
Hemoglobin: 8.1 g/dL — ABNORMAL LOW (ref 12.0–15.0)
Immature Granulocytes: 1 %
Lymphocytes Relative: 18 %
Lymphs Abs: 1 10*3/uL (ref 0.7–4.0)
MCH: 29.3 pg (ref 26.0–34.0)
MCHC: 32.5 g/dL (ref 30.0–36.0)
MCV: 90.2 fL (ref 80.0–100.0)
Monocytes Absolute: 1 10*3/uL (ref 0.1–1.0)
Monocytes Relative: 18 %
Neutro Abs: 3.5 10*3/uL (ref 1.7–7.7)
Neutrophils Relative %: 62 %
Platelet Count: 209 10*3/uL (ref 150–400)
RBC: 2.76 MIL/uL — ABNORMAL LOW (ref 3.87–5.11)
RDW: 16.8 % — ABNORMAL HIGH (ref 11.5–15.5)
WBC Count: 5.6 10*3/uL (ref 4.0–10.5)
nRBC: 0 % (ref 0.0–0.2)

## 2018-07-14 LAB — FUNGUS CULTURE RESULT

## 2018-07-14 LAB — PREPARE RBC (CROSSMATCH)

## 2018-07-14 LAB — FUNGAL ORGANISM REFLEX

## 2018-07-14 MED ORDER — SODIUM CHLORIDE 0.9% IV SOLUTION
250.0000 mL | Freq: Once | INTRAVENOUS | Status: DC
  Filled 2018-07-14: qty 250

## 2018-07-14 MED ORDER — SODIUM CHLORIDE 0.9% FLUSH
10.0000 mL | INTRAVENOUS | Status: DC | PRN
  Filled 2018-07-14: qty 10

## 2018-07-14 MED ORDER — DIPHENHYDRAMINE HCL 25 MG PO CAPS
ORAL_CAPSULE | ORAL | Status: AC
  Filled 2018-07-14: qty 1

## 2018-07-14 MED ORDER — ACETAMINOPHEN 325 MG PO TABS
ORAL_TABLET | ORAL | Status: AC
  Filled 2018-07-14: qty 2

## 2018-07-14 MED ORDER — HEPARIN SOD (PORK) LOCK FLUSH 100 UNIT/ML IV SOLN
500.0000 [IU] | Freq: Once | INTRAVENOUS | Status: DC
  Filled 2018-07-14: qty 5

## 2018-07-14 MED ORDER — HEPARIN SOD (PORK) LOCK FLUSH 100 UNIT/ML IV SOLN
250.0000 [IU] | INTRAVENOUS | Status: AC | PRN
  Administered 2018-07-14: 250 [IU]
  Filled 2018-07-14: qty 5

## 2018-07-14 MED ORDER — HEPARIN SOD (PORK) LOCK FLUSH 100 UNIT/ML IV SOLN
500.0000 [IU] | Freq: Every day | INTRAVENOUS | Status: DC | PRN
  Filled 2018-07-14: qty 5

## 2018-07-14 MED ORDER — DIPHENHYDRAMINE HCL 25 MG PO CAPS
25.0000 mg | ORAL_CAPSULE | Freq: Once | ORAL | Status: AC
  Administered 2018-07-14: 25 mg via ORAL

## 2018-07-14 MED ORDER — SODIUM CHLORIDE 0.9% FLUSH
3.0000 mL | INTRAVENOUS | Status: AC | PRN
  Administered 2018-07-14: 3 mL
  Filled 2018-07-14: qty 10

## 2018-07-14 MED ORDER — ACETAMINOPHEN 325 MG PO TABS
650.0000 mg | ORAL_TABLET | Freq: Once | ORAL | Status: AC
  Administered 2018-07-14: 650 mg via ORAL

## 2018-07-14 MED ORDER — SODIUM CHLORIDE 0.9% FLUSH
10.0000 mL | Freq: Once | INTRAVENOUS | Status: DC
  Filled 2018-07-14: qty 10

## 2018-07-14 NOTE — Progress Notes (Signed)
Hematology and Oncology Follow Up Visit  Patricia Mata [id number] [birth year] 62 y.o. 07/14/2018 8:23 AM   Principle Diagnosis: 62 year old woman with:   1.  Stage III colon cancer diagnosed in 2008.  She has been in remission since that time without any evidence of relapse.  2.  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    Prior Therapy: She is S/P hemicolectomy done in 11/2006. 1/13 lymph nodes are positive.  This was followed by 12 cycles of FOLFOX completed in 05/2007  Aranesp 300 mcg every 2 to 3 weeks therapy was ineffective to eliminate transfusion needs.  5-azacytidine at 75 mg/m square subcutaneously started in October 2018.  She is status post 7 cycles of therapy completed in April 2019.   Current therapy: Supportive care with periodic transfusion.    Interim History:  Patricia Mata returns today for repeat evaluation.  Since last visit, she was hospitalized the early part of September for concern for possible osteomyelitis of the sacrum or she was evaluated by infectious disease and plastic surgery.  He was treated with meropenem and vancomycin which she is currently receiving via PICC line.  She remains in a skilled nursing facility receiving intravenous antibiotics and physical therapy.  Her mobility is limited and mostly uses wheelchair and a walker for short distances.  She denies any fevers or further irritation from her sacrum.  She denies any increased pain or discomfort.  Performance status and quality of life remained stable.  She denied headaches blurry vision, syncope or seizures.  She denies any alteration in mental status or dizziness.  She does not report any fevers, chills sweats. She does not report any chest pain, palpitation orthopnea. She does not report any cough, wheezing or hemoptysis. She does not report any nausea, vomiting or distention.  She denies any changes in bowel habits.  She does  not report any frequency urgency or hesitancy. She does not report any bone pain or pathological fracture.  She denies any bleeding or clotting tendency.  She denies any ecchymosis or rash.  Denies any anxiety or depression.  Remaining review of systems is negative.   Medications: I have reviewed the patient's current medications.   Allergies:  Allergies  Allergen Reactions  . Ancef [Cefazolin] Itching    Severe itching- after procedure, ancef was the antibiotic.-04/02/17  . Sulfa Antibiotics Rash and Other (See Comments)    Blisters, also     Physical Exam:    Blood pressure (!) 175/48, pulse (!) 50, temperature 98.2 F (36.8 C), temperature source Oral, resp. rate 18, SpO2 100 %.     ECOG: 2    General appearance: Alert, awake without any distress. Head: Atraumatic without abnormalities Oropharynx: Without any thrush or ulcers. Eyes: No scleral icterus. Lymph nodes: No lymphadenopathy noted in the cervical, supraclavicular, or axillary nodes Heart:regular rate and rhythm,  with systolic ejection murmur noted.  Mild edema bilaterally. Lung: Clear to auscultation without any rhonchi, wheezes or dullness to percussion. Abdomin: Soft, nontender without any shifting dullness or ascites. Musculoskeletal: No clubbing or cyanosis. Neurological: No motor or sensory deficits. Skin: No rashes or lesions.          Lab Results: Lab Results  Component Value Date   WBC 4.8 06/30/2018   HGB 7.1 (L) 06/30/2018   HCT 22.0 (L) 06/30/2018   MCV 91.7 06/30/2018   PLT 236 06/30/2018     Chemistry      Component Value  Date/Time   NA 135 06/30/2018 1011   NA 132 (L) 06/10/2017 1028   NA 134 (L) 05/26/2017 0949   K 4.4 06/30/2018 1011   K 4.0 05/26/2017 0949   CL 101 06/30/2018 1011   CL 96 (L) 03/31/2012 1513   CO2 24 06/30/2018 1011   CO2 24 05/26/2017 0949   BUN 24 (H) 06/30/2018 1011   BUN 23 06/10/2017 1028   BUN 29.2 (H) 05/26/2017 0949   CREATININE 1.15 (H)  06/30/2018 1011   CREATININE 1.4 (H) 05/26/2017 0949      Component Value Date/Time   CALCIUM 9.6 06/30/2018 1011   CALCIUM 8.9 05/26/2017 0949   ALKPHOS 111 06/30/2018 1011   ALKPHOS 157 (H) 05/26/2017 0949   AST 39 06/30/2018 1011   AST 29 05/26/2017 0949   ALT 28 06/30/2018 1011   ALT 23 05/26/2017 0949   BILITOT 1.1 06/30/2018 1011   BILITOT 2.19 (H) 05/26/2017 0949     Current Outpatient Medications on File Prior to Visit  Medication Sig Dispense Refill  . amiodarone (PACERONE) 200 MG tablet Take 1 tablet (200 mg total) by mouth 2 (two) times daily. 60 tablet 3  . amLODipine (NORVASC) 2.5 MG tablet Take 2.5 mg by mouth daily.    Marland Kitchen atorvastatin (LIPITOR) 40 MG tablet Take 1 tablet (40 mg total) by mouth daily. 90 tablet 0  . collagenase (SANTYL) ointment Apply topically daily. 15 g 0  . ertapenem (INVANZ) IVPB Inject 1 g into the vein daily. Indication:  Morganella coccygeal osteomyelitis Last Day of Therapy:  07/24/2018 Labs - Once weekly:  CBC/D and BMP, Labs - Every other week:  ESR and CRP 40 Units 0  . feeding supplement, GLUCERNA SHAKE, (GLUCERNA SHAKE) LIQD Take 237 mLs by mouth 3 (three) times daily between meals. (Patient not taking: Reported on 06/08/2018) 90 Can 0  . furosemide (LASIX) 40 MG tablet Take 1 tablet (40 mg total) by mouth daily. 60 tablet 5  . hydrALAZINE (APRESOLINE) 100 MG tablet Take 1 tablet (100 mg total) by mouth 3 (three) times daily. 90 tablet 6  . insulin glargine (LANTUS) 100 unit/mL SOPN Inject 0.14 mLs (14 Units total) into the skin daily after breakfast. 15 mL 11  . Insulin Pen Needle 29G X 5MM MISC Use as directed 200 each 0  . JANUMET XR 50-500 MG TB24 Take 1 tablet by mouth daily. With evening meal  2  . latanoprost (XALATAN) 0.005 % ophthalmic solution Place 1 drop into both eyes at bedtime.     Marland Kitchen levothyroxine (SYNTHROID, LEVOTHROID) 25 MCG tablet Take 1 tablet (25 mcg total) by mouth daily at 6 (six) AM.    . metoprolol succinate  (TOPROL-XL) 25 MG 24 hr tablet Take 1 tablet (25 mg total) by mouth 2 (two) times daily. 30 tablet 5  . Multiple Vitamins-Minerals (CENTRUM SILVER 50+WOMEN) TABS Take 1 tablet by mouth daily.    . mupirocin ointment (BACTROBAN) 2 % Place 1 application into the nose daily.    Marland Kitchen oxycodone-acetaminophen (PERCOCET) 2.5-325 MG tablet Take 1 tablet by mouth every 8 (eight) hours as needed for pain. 15 tablet 0  . pantoprazole (PROTONIX) 40 MG tablet Take 1 tablet (40 mg total) by mouth daily. 30 tablet 5  . polyethylene glycol (MIRALAX / GLYCOLAX) packet Take 17 g by mouth 2 (two) times daily. 14 each 0  . potassium chloride SA (K-DUR,KLOR-CON) 20 MEQ tablet Take 1 tablet (20 mEq total) by mouth once for 1 dose. [place]  tablet 5  . senna (SENOKOT) 8.6 MG TABS tablet Take 2 tablets (17.2 mg total) by mouth at bedtime. 120 each 0  . sildenafil (REVATIO) 20 MG tablet Take 1 tablet (20 mg total) by mouth 3 (three) times daily. 90 tablet 5  . sodium hypochlorite (DAKIN'S 1/2 STRENGTH) external solution Irrigate with 1 application as directed daily.    Marland Kitchen spironolactone (ALDACTONE) 25 MG tablet Take 0.5 tablets (12.5 mg total) by mouth daily. 15 tablet 5  . traMADol (ULTRAM) 50 MG tablet Take 1 tablet (50 mg total) by mouth daily. 30 tablet 0   Current Facility-Administered Medications on File Prior to Visit  Medication Dose Route Frequency Provider Last Rate Last Dose  . sodium chloride flush (NS) 0.9 % injection 10 mL  10 mL Intravenous PRN [name], MD         Impression and Plan:  62 year old woman with:   1.  Myelodysplastic syndrome with trilineage dysplasia presented with anemia in July 2019.    She is status post therapy outlined above and remains refractory at this time.  Given her cardiac comorbidities, she would not be a candidate for aggressive therapy and stem cell transplant.  She currently receiving supportive management with transfusion as needed.  I disease status was updated today in  detail as well as prognosis.  She is agreeable to continue with supportive management.  2.  Anemia: Hemoglobin is 8 today and she will receive 1 unit of packed red cells.  We will continue to monitor periodically and transfuse as needed.   3. Hypertension: Blood pressure elevated today although has been under control mostly.   4.  Aortic stenosis: She continues to follow with cardiology at this time.  5.  Sacral wound: She has been treated for osteomyelitis of the coccyx with intravenous antibiotic.  Continues to follow with infectious disease and plastic surgery.  6.  Prognosis: Overall poor prognosis given her multiple comorbid condition and transfusion dependence.  Her performance status remains adequate at this time aggressive measures are warranted.  7. Follow-up: In 2 to 3 weeks for repeat CBC and transfusion as needed.  25  minutes was spent with the patient face-to-face today.  More than 50% of time was dedicated to reviewing her disease status, laboratory data and answering question regarding future plan of care.   Zola Button, MD 12/31/20198:23 AM

## 2018-07-14 NOTE — Telephone Encounter (Signed)
Printed calendar and avs. °

## 2018-07-14 NOTE — Patient Instructions (Signed)
Blood Transfusion, Adult A blood transfusion is a procedure in which you are given blood through an IV tube. You may need this procedure because of:  Illness.  Surgery.  Injury. The blood may come from someone else (a donor). You may also be able to donate blood for yourself (autologous blood donation). The blood given in a transfusion is made up of different types of cells. You may get:  Red blood cells. These carry oxygen to the cells in the body.  White blood cells. These help you fight infections.  Platelets. These help your blood to clot.  Plasma. This is the liquid part of your blood. It helps with fluid imbalances. If you have a clotting disorder, you may also get other types of blood products. What happens before the procedure?  You will have a blood test to find out your blood type. The test also finds out what type of blood your body will accept and matches it to the donor type.  If you are going to have a planned surgery, you may be able to donate your own blood. This may be done in case you need a transfusion.  If you have had an allergic reaction to a transfusion in the past, you may be given medicine to help prevent a reaction. This medicine may be given to you by mouth or through an IV.  You will have your temperature, blood pressure, and pulse checked.  Follow instructions from your doctor about what you cannot eat or drink.  Ask your doctor about: ? Changing or stopping your regular medicines. This is important if you take diabetes medicines or blood thinners. ? Taking medicines such as aspirin and ibuprofen. These medicines can thin your blood. Do not take these medicines before your procedure if your doctor tells you not to. What happens during the procedure?  An IV tube will be put into one of your veins.  The bag of donated blood will be attached to your IV tube. Then, the blood will enter through your vein.  Your temperature, blood pressure, and pulse will  be checked regularly during the procedure. This is done to find early signs of a transfusion reaction.  If you have any signs or symptoms of a reaction, your transfusion will be stopped. You may also be given medicine.  When the transfusion is done, your IV tube will be taken out.  Pressure may be applied to the IV site for a few minutes.  A bandage (dressing) will be put on the IV site. The procedure may vary among doctors and hospitals. What happens after the procedure?  Your temperature, blood pressure, heart rate, breathing rate, and blood oxygen level will be checked often.  Your blood may be tested to see how you are responding to the transfusion.  You may be warmed with fluids or blankets. This is done to keep the temperature of your body normal. Summary  A blood transfusion is a procedure in which you are given blood through an IV tube.  The blood may come from someone else (a donor). You may also be able to donate blood for yourself.  If you have had an allergic reaction to a transfusion in the past, you may be given medicine to help prevent a reaction. This medicine may be given to you by mouth or through an IV tube.  Your temperature, blood pressure, heart rate, breathing rate, and blood oxygen level will be checked often.  Your blood may be tested to see   how you are responding to the transfusion. This information is not intended to replace advice given to you by your health care provider. Make sure you discuss any questions you have with your health care provider. Document Released: 09/27/2008 Document Revised: 02/23/2016 Document Reviewed: 02/23/2016 [employer] Interactive Patient Education  2019 [employer]  

## 2018-07-15 LAB — BPAM RBC
Blood Product Expiration Date: 202001282359
ISSUE DATE / TIME: 201912311202
Unit Type and Rh: 600

## 2018-07-15 LAB — TYPE AND SCREEN
ABO/RH(D): A NEG
Antibody Screen: POSITIVE
Unit division: 0

## 2018-07-16 ENCOUNTER — Other Ambulatory Visit: Payer: Self-pay | Admitting: Internal Medicine

## 2018-07-22 ENCOUNTER — Other Ambulatory Visit: Payer: Self-pay | Admitting: Nurse Practitioner

## 2018-07-22 DIAGNOSIS — E7800 Pure hypercholesterolemia, unspecified: Secondary | ICD-10-CM

## 2018-07-24 ENCOUNTER — Encounter: Payer: Self-pay | Admitting: Infectious Disease

## 2018-07-24 ENCOUNTER — Ambulatory Visit (INDEPENDENT_AMBULATORY_CARE_PROVIDER_SITE_OTHER): Payer: BC Managed Care – PPO | Admitting: Infectious Disease

## 2018-07-24 VITALS — BP 193/74 | HR 53 | Temp 98.1°F | Wt 188.0 lb

## 2018-07-24 DIAGNOSIS — A498 Other bacterial infections of unspecified site: Secondary | ICD-10-CM | POA: Diagnosis not present

## 2018-07-24 DIAGNOSIS — M86159 Other acute osteomyelitis, unspecified femur: Secondary | ICD-10-CM

## 2018-07-24 DIAGNOSIS — L89154 Pressure ulcer of sacral region, stage 4: Secondary | ICD-10-CM | POA: Diagnosis not present

## 2018-07-24 DIAGNOSIS — D469 Myelodysplastic syndrome, unspecified: Secondary | ICD-10-CM | POA: Diagnosis not present

## 2018-07-24 NOTE — Progress Notes (Signed)
Subjective:  Chief complaint follow-up for sacral decubitus ulcer which is improving  Patient ID: Patricia Mata, female    DOB: 09-14-1955, 63 y.o.   MRN: [id number]  HPI  Patricia Mata is a 63 y.o. female history severe aortic stenosis, myelodysplastic syndrome colon cancer status post hemicolectomy who had protracted hospital stay in which she had severe atrial fibrillation and hypotension all failure requiring CVVHD and multiple transfusions and a protracted stay in the ICU.  She developed a decubitus ulcer that has become a stage IV ulcer and has been at skilled nursing facility.  Sent to emergency department on November 25 due to worsening bleeding and blood clots found in her sacral decubitus ulcer.  She was seen by her wound care specialist Dr. Renard Hamper who try to cauterize this area but it continued to bleed.  She was admitted via the emergency department initially given antimicrobials in the form of vancomycin and metronidazole.  These were discontinued faction felt not to be present in the soft tissues.  Ultimately an MRI was performed which showed evidence of early coccygeal osteomyelitis.  Discontinue her antibiotics that she was on in the hospital and facilitated consultation with plastic surgery and Dr. Marla Roe performed I&D bone biopsy from the coccygeal region and sent this for culture and this grew Morganella morganii.  We have been treating her with Invanz for now 6 weeks.  The wound has been decreasing size with increasing granulation tissue.  She has reduced pain posteriorly and is being able to offload the area.  She is currently residing at skilled nursing facility:    [facility] by her symptoms and the appearance of the wound though it still is a fairly sizable ulcer it goes only a centimeter deep at this point when I probed it.  I would like to check some inflammatory markers by I am comfortable discontinuing her IV antibiotics if her inflammatory  markers do not improving I may extend her antibiotics with oral ciprofloxacin  Past Medical History:  Diagnosis Date  . Cancer [facility]) 2008   Colon   . Diabetes mellitus without complication ([place])   . Hypertension   . Macrocytic anemia     Past Surgical History:  Procedure Laterality Date  . COLON SURGERY    . DEBRIDMENT OF DECUBITUS ULCER N/A 06/12/2018   Procedure: DEBRIDMENT OF DECUBITUS ULCER;  Surgeon: Wallace Going, DO;  Location: [facility];  Service: Plastics;  Laterality: N/A;  . IR FLUORO GUIDE PORT INSERTION RIGHT  04/02/2017  . IR US GUIDE VASC ACCESS RIGHT  04/02/2017  . RIGHT/LEFT HEART CATH AND CORONARY ANGIOGRAPHY N/A 05/21/2018   Procedure: RIGHT/LEFT HEART CATH AND CORONARY ANGIOGRAPHY;  Surgeon: Nelva Bush, MD;  Location: [facility];  Service: Cardiovascular;  Laterality: N/A;    Family History  Problem Relation Age of Onset  . Hypertension Mother   . Heart attack Father   . Hypertension Sister   . Hypertension Brother   . Hypertension Sister   . Hypertension Sister   . Prostate cancer Brother   . HIV/AIDS Brother       Social History   Socioeconomic History  . Marital status: Married    Spouse name: Not on file  . Number of children: Not on file  . Years of education: Not on file  . Highest education level: Not on file  Occupational History  . Not on file  Social Needs  . Financial resource strain: Not on file  .  Food insecurity:    Worry: Not on file    Inability: Not on file  . Transportation needs:    Medical: Not on file    Non-medical: Not on file  Tobacco Use  . Smoking status: Former Smoker    Types: Cigarettes    Last attempt to quit: 01/31/2001    Years since quitting: 17.4  . Smokeless tobacco: Former Network engineer and Sexual Activity  . Alcohol use: Yes    Alcohol/week: 2.0 standard drinks    Types: 2 Shots of liquor per week  . Drug use: Never  . Sexual activity: Not on file  Lifestyle  . Physical activity:     Days per week: Not on file    Minutes per session: Not on file  . Stress: Not on file  Relationships  . Social connections:    Talks on phone: Not on file    Gets together: Not on file    Attends religious service: Not on file    Active member of club or organization: Not on file    Attends meetings of clubs or organizations: Not on file    Relationship status: Not on file  Other Topics Concern  . Not on file  Social History Narrative  . Not on file    Allergies  Allergen Reactions  . Ancef [Cefazolin] Itching    Severe itching- after procedure, ancef was the antibiotic.-04/02/17  . Sulfa Antibiotics Rash and Other (See Comments)    Blisters, also     Current Outpatient Medications:  .  amiodarone (PACERONE) 200 MG tablet, Take 1 tablet (200 mg total) by mouth 2 (two) times daily., Disp: 60 tablet, Rfl: 3 .  amLODipine (NORVASC) 2.5 MG tablet, Take 2.5 mg by mouth daily., Disp: , Rfl:  .  atorvastatin (LIPITOR) 40 MG tablet, TAKE 1 TABLET BY MOUTH EVERY DAY, Disp: 90 tablet, Rfl: 0 .  collagenase (SANTYL) ointment, Apply topically daily., Disp: 15 g, Rfl: 0 .  ertapenem (INVANZ) IVPB, Inject 1 g into the vein daily. Indication:  Morganella coccygeal osteomyelitis Last Day of Therapy:  07/24/2018 Labs - Once weekly:  CBC/D and BMP, Labs - Every other week:  ESR and CRP, Disp: 40 Units, Rfl: 0 .  feeding supplement, GLUCERNA SHAKE, (GLUCERNA SHAKE) LIQD, Take 237 mLs by mouth 3 (three) times daily between meals., Disp: 90 Can, Rfl: 0 .  furosemide (LASIX) 20 MG tablet, TAKE 1 TABLET BY MOUTH EVERY DAY, Disp: 90 tablet, Rfl: 0 .  furosemide (LASIX) 40 MG tablet, Take 1 tablet (40 mg total) by mouth daily., Disp: 60 tablet, Rfl: 5 .  hydrALAZINE (APRESOLINE) 100 MG tablet, Take 1 tablet (100 mg total) by mouth 3 (three) times daily., Disp: 90 tablet, Rfl: 6 .  insulin glargine (LANTUS) 100 unit/mL SOPN, Inject 0.14 mLs (14 Units total) into the skin daily after breakfast., Disp: 15  mL, Rfl: 11 .  Insulin Pen Needle 29G X 5MM MISC, Use as directed, Disp: 200 each, Rfl: 0 .  JANUMET XR 50-500 MG TB24, Take 1 tablet by mouth daily. With evening meal, Disp: , Rfl: 2 .  latanoprost (XALATAN) 0.005 % ophthalmic solution, Place 1 drop into both eyes at bedtime. , Disp: , Rfl:  .  levothyroxine (SYNTHROID, LEVOTHROID) 25 MCG tablet, Take 1 tablet (25 mcg total) by mouth daily at 6 (six) AM., Disp: , Rfl:  .  metoprolol succinate (TOPROL-XL) 25 MG 24 hr tablet, Take 1 tablet (25 mg total) by mouth  2 (two) times daily., Disp: 30 tablet, Rfl: 5 .  Multiple Vitamins-Minerals (CENTRUM SILVER 50+WOMEN) TABS, Take 1 tablet by mouth daily., Disp: , Rfl:  .  mupirocin ointment (BACTROBAN) 2 %, Place 1 application into the nose daily., Disp: , Rfl:  .  oxycodone-acetaminophen (PERCOCET) 2.5-325 MG tablet, Take 1 tablet by mouth every 8 (eight) hours as needed for pain., Disp: 15 tablet, Rfl: 0 .  pantoprazole (PROTONIX) 40 MG tablet, Take 1 tablet (40 mg total) by mouth daily., Disp: 30 tablet, Rfl: 5 .  polyethylene glycol (MIRALAX / GLYCOLAX) packet, Take 17 g by mouth 2 (two) times daily., Disp: 14 each, Rfl: 0 .  potassium chloride SA (K-DUR,KLOR-CON) 20 MEQ tablet, Take 1 tablet (20 mEq total) by mouth once for 1 dose., Disp: 60 tablet, Rfl: 5 .  senna (SENOKOT) 8.6 MG TABS tablet, Take 2 tablets (17.2 mg total) by mouth at bedtime., Disp: 120 each, Rfl: 0 .  sildenafil (REVATIO) 20 MG tablet, Take 1 tablet (20 mg total) by mouth 3 (three) times daily., Disp: 90 tablet, Rfl: 5 .  sodium hypochlorite (DAKIN'S 1/2 STRENGTH) external solution, Irrigate with 1 application as directed daily., Disp: , Rfl:  .  spironolactone (ALDACTONE) 25 MG tablet, Take 0.5 tablets (12.5 mg total) by mouth daily., Disp: 15 tablet, Rfl: 5 .  traMADol (ULTRAM) 50 MG tablet, Take 1 tablet (50 mg total) by mouth daily., Disp: 30 tablet, Rfl: 0 No current facility-administered medications for this visit.    Facility-Administered Medications Ordered in Other Visits:  .  sodium chloride flush (NS) 0.9 % injection 10 mL, 10 mL, Intravenous, PRN, Alen Blew, Mathis Dad, MD   Review of Systems  Constitutional: Negative for activity change, appetite change, chills, diaphoresis, fatigue, fever and unexpected weight change.  HENT: Negative for congestion, rhinorrhea, sinus pressure, sneezing, sore throat and trouble swallowing.   Eyes: Negative for photophobia and visual disturbance.  Respiratory: Negative for cough, chest tightness, shortness of breath, wheezing and stridor.   Cardiovascular: Negative for chest pain, palpitations and leg swelling.  Gastrointestinal: Negative for abdominal distention, abdominal pain, anal bleeding, blood in stool, constipation, diarrhea, nausea and vomiting.  Genitourinary: Negative for difficulty urinating, dysuria, flank pain and hematuria.  Musculoskeletal: Positive for back pain. Negative for arthralgias, gait problem, joint swelling and myalgias.  Skin: Positive for wound. Negative for color change, pallor and rash.  Neurological: Negative for dizziness, tremors, weakness and light-headedness.  Hematological: Negative for adenopathy. Does not bruise/bleed easily.  Psychiatric/Behavioral: Negative for agitation, behavioral problems, confusion, decreased concentration, dysphoric mood and sleep disturbance.       Objective:   Physical Exam Constitutional:      General: She is not in acute distress.    Appearance: Normal appearance. She is well-developed. She is not ill-appearing or diaphoretic.  HENT:     Head: Normocephalic and atraumatic.     Right Ear: Hearing and external ear normal.     Left Ear: Hearing and external ear normal.     Nose: No nasal deformity or rhinorrhea.  Eyes:     General: No scleral icterus.    Conjunctiva/sclera: Conjunctivae normal.     Right eye: Right conjunctiva is not injected.     Left eye: Left conjunctiva is not injected.   Neck:     Musculoskeletal: Normal range of motion and neck supple.     Vascular: No JVD.  Cardiovascular:     Rate and Rhythm: Normal rate and regular rhythm.  Heart sounds: S1 normal and S2 normal.  Pulmonary:     Effort: Pulmonary effort is normal.     Breath sounds: No wheezing.  Abdominal:     General: There is no distension.  Musculoskeletal: Normal range of motion.     Right shoulder: Normal.     Left shoulder: Normal.     Right hip: Normal.     Left hip: Normal.     Right knee: Normal.     Left knee: Normal.  Lymphadenopathy:     Head:     Right side of head: No submandibular, preauricular or posterior auricular adenopathy.     Left side of head: No submandibular, preauricular or posterior auricular adenopathy.     Cervical:     Right cervical: No superficial or deep cervical adenopathy.    Left cervical: No superficial or deep cervical adenopathy.  Skin:    General: Skin is warm and dry.     Coloration: Skin is not pale.     Findings: No abrasion, bruising, ecchymosis, erythema, lesion or rash.     Nails: There is no clubbing.   Neurological:     General: No focal deficit present.     Mental Status: She is alert and oriented to person, place, and time.     Sensory: No sensory deficit.     Coordination: Coordination normal.     Gait: Gait normal.  Psychiatric:        Attention and Perception: She is attentive.        Mood and Affect: Mood normal.        Speech: Speech normal.        Behavior: Behavior normal. Behavior is cooperative.        Thought Content: Thought content normal.        Judgment: Judgment normal.   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  If  inflammatory markers have not diminished in a satisfactory manner we will extend her with oral ciprofloxacin  I will see her at the end of January.  Myelodysplastic syndrome: Wonder if this may potentially be a confounder in terms of her inflammatory markers she being followed closely by Dr. Barbaraann Faster  I spent greater than 25 minutes with the patient including greater than 50% of time in face to face counsel of the patient and her daughter regards the nature of coccygeal osteomyelitis and decubitus ulcers management plan potential confounders to the inflammatory markers and in coordination of her care.

## 2018-07-25 LAB — SEDIMENTATION RATE: Sed Rate: 92 mm/h — ABNORMAL HIGH (ref 0–30)

## 2018-07-25 LAB — BASIC METABOLIC PANEL WITH GFR
BUN/Creatinine Ratio: 30 (calc) — ABNORMAL HIGH (ref 6–22)
BUN: 36 mg/dL — ABNORMAL HIGH (ref 7–25)
CO2: 26 mmol/L (ref 20–32)
Calcium: 9.7 mg/dL (ref 8.6–10.4)
Chloride: 101 mmol/L (ref 98–110)
Creat: 1.22 mg/dL — ABNORMAL HIGH (ref 0.50–0.99)
GFR, Est African American: 55 mL/min/{1.73_m2} — ABNORMAL LOW (ref >=60)
GFR, Est Non African American: 47 mL/min/{1.73_m2} — ABNORMAL LOW (ref >=60)
Glucose, Bld: 89 mg/dL (ref 65–99)
Potassium: 4.2 mmol/L (ref 3.5–5.3)
Sodium: 137 mmol/L (ref 135–146)

## 2018-07-25 LAB — C-REACTIVE PROTEIN: CRP: 41.8 mg/L — ABNORMAL HIGH (ref <8.0)

## 2018-07-27 ENCOUNTER — Telehealth: Payer: Self-pay

## 2018-07-27 NOTE — Telephone Encounter (Signed)
Spoke with Donna Christen, at [facility].  Advised there is a new order from Dr Tommy Medal for Patricia Mata. He was not able to take a verbal order and asked to send it via fax.   Fax number given as [phone].  Orders faxed : Attention Jasmine Awe, RN

## 2018-07-27 NOTE — Telephone Encounter (Signed)
-----  Message from Truman Hayward, MD sent at 07/27/2018  3:16 PM EST ----- Regarding: pts ESR< CRP still up would like her to go to cipro orally when she is finished with her IV abx Dose will be cipro '500mg'$  q 12 hours with #60 and should have appt w me upcoming ----- Message ----- From: Cheyenne Adas Lab Results In Sent: 07/25/2018  12:04 AM EST To: Truman Hayward, MD

## 2018-07-31 ENCOUNTER — Inpatient Hospital Stay: Payer: BC Managed Care – PPO

## 2018-07-31 ENCOUNTER — Inpatient Hospital Stay: Payer: BC Managed Care – PPO | Attending: Oncology

## 2018-07-31 DIAGNOSIS — M869 Osteomyelitis, unspecified: Secondary | ICD-10-CM | POA: Diagnosis not present

## 2018-07-31 DIAGNOSIS — D469 Myelodysplastic syndrome, unspecified: Secondary | ICD-10-CM | POA: Diagnosis not present

## 2018-07-31 DIAGNOSIS — D631 Anemia in chronic kidney disease: Secondary | ICD-10-CM

## 2018-07-31 DIAGNOSIS — N189 Chronic kidney disease, unspecified: Secondary | ICD-10-CM

## 2018-07-31 DIAGNOSIS — I350 Nonrheumatic aortic (valve) stenosis: Secondary | ICD-10-CM | POA: Insufficient documentation

## 2018-07-31 DIAGNOSIS — Z85038 Personal history of other malignant neoplasm of large intestine: Secondary | ICD-10-CM | POA: Insufficient documentation

## 2018-07-31 DIAGNOSIS — D649 Anemia, unspecified: Secondary | ICD-10-CM | POA: Diagnosis not present

## 2018-07-31 DIAGNOSIS — I10 Essential (primary) hypertension: Secondary | ICD-10-CM | POA: Insufficient documentation

## 2018-07-31 LAB — CMP (CANCER CENTER ONLY)
ALT: 41 U/L (ref 0–44)
AST: 45 U/L — ABNORMAL HIGH (ref 15–41)
Albumin: 3.2 g/dL — ABNORMAL LOW (ref 3.5–5.0)
Alkaline Phosphatase: 165 U/L — ABNORMAL HIGH (ref 38–126)
Anion gap: 11 (ref 5–15)
BUN: 49 mg/dL — ABNORMAL HIGH (ref 8–23)
CO2: 23 mmol/L (ref 22–32)
Calcium: 9.9 mg/dL (ref 8.9–10.3)
Chloride: 104 mmol/L (ref 98–111)
Creatinine: 1.6 mg/dL — ABNORMAL HIGH (ref 0.44–1.00)
GFR, Est AFR Am: 39 mL/min — ABNORMAL LOW (ref >60)
GFR, Estimated: 34 mL/min — ABNORMAL LOW (ref >60)
Glucose, Bld: 132 mg/dL — ABNORMAL HIGH (ref 70–99)
Potassium: 4.2 mmol/L (ref 3.5–5.1)
Sodium: 138 mmol/L (ref 135–145)
Total Bilirubin: 0.9 mg/dL (ref 0.3–1.2)
Total Protein: 8 g/dL (ref 6.5–8.1)

## 2018-07-31 LAB — CBC WITH DIFFERENTIAL (CANCER CENTER ONLY)
Abs Immature Granulocytes: 0.02 10*3/uL (ref 0.00–0.07)
Basophils Absolute: 0 10*3/uL (ref 0.0–0.1)
Basophils Relative: 0 %
Eosinophils Absolute: 0.1 10*3/uL (ref 0.0–0.5)
Eosinophils Relative: 1 %
HCT: 24.4 % — ABNORMAL LOW (ref 36.0–46.0)
Hemoglobin: 8.1 g/dL — ABNORMAL LOW (ref 12.0–15.0)
Immature Granulocytes: 0 %
Lymphocytes Relative: 14 %
Lymphs Abs: 1 10*3/uL (ref 0.7–4.0)
MCH: 29.7 pg (ref 26.0–34.0)
MCHC: 33.2 g/dL (ref 30.0–36.0)
MCV: 89.4 fL (ref 80.0–100.0)
Monocytes Absolute: 1.1 10*3/uL — ABNORMAL HIGH (ref 0.1–1.0)
Monocytes Relative: 15 %
Neutro Abs: 5 10*3/uL (ref 1.7–7.7)
Neutrophils Relative %: 70 %
Platelet Count: 232 10*3/uL (ref 150–400)
RBC: 2.73 MIL/uL — ABNORMAL LOW (ref 3.87–5.11)
RDW: 17.9 % — ABNORMAL HIGH (ref 11.5–15.5)
WBC Count: 7.2 10*3/uL (ref 4.0–10.5)
nRBC: 0 % (ref 0.0–0.2)

## 2018-07-31 LAB — SAMPLE TO BLOOD BANK

## 2018-07-31 NOTE — Progress Notes (Signed)
Per Dr. Alen Blew, Pt does not require blood transfusion today. Pt with hgb 8.1, states she feels great and does not feel as if she needs a transfusion. Pt given an copy of labs.

## 2018-08-01 ENCOUNTER — Telehealth: Payer: Self-pay | Admitting: Physician Assistant

## 2018-08-01 NOTE — Telephone Encounter (Signed)
Paged by Lind [facility] ([phone]) regarding SBP of 180. She was given hydralazine around 6-7AM this morning, last dose of 1PM. Next dose of hydralazine is 7PM. I instructed the nurse to given 50mg  extra hydralazine now. I am aware that she is already on max dose of hydralazine 100mg  TID, however unable to give extra of metoprolol given baseline HR in the low 50s and unable to give extra dose of spironolactone as yesterday lab showed worsening renal function.   Patient is to be seen in the heart failure clinic in 3 days, diuretic dose and BP meds may need to be adjusted.  Hilbert Corrigan PA Pager: [phone]

## 2018-08-03 NOTE — Telephone Encounter (Signed)
Thank you, will address at clinic visit tomorrow am!   Lollie Marrow, [place] 08/03/2018 8:04 AM

## 2018-08-04 ENCOUNTER — Encounter (HOSPITAL_COMMUNITY): Payer: Self-pay

## 2018-08-04 ENCOUNTER — Telehealth: Payer: Self-pay | Admitting: Cardiology

## 2018-08-04 ENCOUNTER — Ambulatory Visit (HOSPITAL_COMMUNITY)
Admission: RE | Admit: 2018-08-04 | Discharge: 2018-08-04 | Disposition: A | Discharge status: Home / Self Care | Payer: BC Managed Care – PPO | Source: Ambulatory Visit | Attending: Cardiology | Admitting: Cardiology

## 2018-08-04 VITALS — BP 166/70 | HR 52 | Wt 185.0 lb

## 2018-08-04 DIAGNOSIS — I5032 Chronic diastolic (congestive) heart failure: Secondary | ICD-10-CM

## 2018-08-04 DIAGNOSIS — Z8249 Family history of ischemic heart disease and other diseases of the circulatory system: Secondary | ICD-10-CM | POA: Insufficient documentation

## 2018-08-04 DIAGNOSIS — Z794 Long term (current) use of insulin: Secondary | ICD-10-CM | POA: Insufficient documentation

## 2018-08-04 DIAGNOSIS — N186 End stage renal disease: Secondary | ICD-10-CM | POA: Insufficient documentation

## 2018-08-04 DIAGNOSIS — Z881 Allergy status to other antibiotic agents status: Secondary | ICD-10-CM | POA: Diagnosis not present

## 2018-08-04 DIAGNOSIS — Z85038 Personal history of other malignant neoplasm of large intestine: Secondary | ICD-10-CM | POA: Insufficient documentation

## 2018-08-04 DIAGNOSIS — D469 Myelodysplastic syndrome, unspecified: Secondary | ICD-10-CM | POA: Diagnosis not present

## 2018-08-04 DIAGNOSIS — E669 Obesity, unspecified: Secondary | ICD-10-CM | POA: Diagnosis not present

## 2018-08-04 DIAGNOSIS — R001 Bradycardia, unspecified: Secondary | ICD-10-CM | POA: Insufficient documentation

## 2018-08-04 DIAGNOSIS — I10 Essential (primary) hypertension: Secondary | ICD-10-CM

## 2018-08-04 DIAGNOSIS — Z87891 Personal history of nicotine dependence: Secondary | ICD-10-CM | POA: Insufficient documentation

## 2018-08-04 DIAGNOSIS — I120 Hypertensive chronic kidney disease with stage 5 chronic kidney disease or end stage renal disease: Secondary | ICD-10-CM | POA: Insufficient documentation

## 2018-08-04 DIAGNOSIS — Z79890 Hormone replacement therapy (postmenopausal): Secondary | ICD-10-CM | POA: Diagnosis not present

## 2018-08-04 DIAGNOSIS — I480 Paroxysmal atrial fibrillation: Secondary | ICD-10-CM

## 2018-08-04 DIAGNOSIS — E7800 Pure hypercholesterolemia, unspecified: Secondary | ICD-10-CM | POA: Diagnosis not present

## 2018-08-04 DIAGNOSIS — E875 Hyperkalemia: Secondary | ICD-10-CM | POA: Diagnosis not present

## 2018-08-04 DIAGNOSIS — Z66 Do not resuscitate: Secondary | ICD-10-CM | POA: Insufficient documentation

## 2018-08-04 DIAGNOSIS — N179 Acute kidney failure, unspecified: Secondary | ICD-10-CM | POA: Diagnosis not present

## 2018-08-04 DIAGNOSIS — I2721 Secondary pulmonary arterial hypertension: Secondary | ICD-10-CM | POA: Diagnosis not present

## 2018-08-04 DIAGNOSIS — Z882 Allergy status to sulfonamides status: Secondary | ICD-10-CM | POA: Insufficient documentation

## 2018-08-04 DIAGNOSIS — Z79899 Other long term (current) drug therapy: Secondary | ICD-10-CM | POA: Diagnosis not present

## 2018-08-04 DIAGNOSIS — E1122 Type 2 diabetes mellitus with diabetic chronic kidney disease: Secondary | ICD-10-CM | POA: Insufficient documentation

## 2018-08-04 DIAGNOSIS — I272 Pulmonary hypertension, unspecified: Secondary | ICD-10-CM | POA: Insufficient documentation

## 2018-08-04 DIAGNOSIS — M868X9 Other osteomyelitis, unspecified sites: Secondary | ICD-10-CM | POA: Insufficient documentation

## 2018-08-04 MED ORDER — ATORVASTATIN CALCIUM 40 MG PO TABS: 40.0000 mg | ORAL_TABLET | Freq: Every day | ORAL | 0 refills | Status: DC

## 2018-08-04 MED ORDER — AMIODARONE HCL 200 MG PO TABS: 200.0000 mg | ORAL_TABLET | Freq: Every day | ORAL | 3 refills | Status: DC

## 2018-08-04 MED ORDER — METOPROLOL SUCCINATE ER 25 MG PO TB24: 25.0000 mg | ORAL_TABLET | Freq: Every day | ORAL | 5 refills | Status: DC

## 2018-08-04 MED ORDER — AMLODIPINE BESYLATE 2.5 MG PO TABS: 5.0000 mg | ORAL_TABLET | Freq: Every day | ORAL | 3 refills | Status: DC

## 2018-08-04 MED ORDER — FUROSEMIDE 40 MG PO TABS: 40.0000 mg | ORAL_TABLET | Freq: Every day | ORAL | 5 refills | Status: DC

## 2018-08-04 MED ORDER — SILDENAFIL CITRATE 20 MG PO TABS: 20.0000 mg | ORAL_TABLET | Freq: Three times a day (TID) | ORAL | 5 refills | Status: DC

## 2018-08-04 MED ORDER — POTASSIUM CHLORIDE CRYS ER 20 MEQ PO TBCR: 20.0000 meq | EXTENDED_RELEASE_TABLET | Freq: Once | ORAL | 5 refills | Status: DC

## 2018-08-04 MED ORDER — METOPROLOL SUCCINATE ER 25 MG PO TB24: 25.0000 mg | ORAL_TABLET | Freq: Two times a day (BID) | ORAL | 5 refills | Status: DC

## 2018-08-04 MED ORDER — SPIRONOLACTONE 25 MG PO TABS: 12.5000 mg | ORAL_TABLET | Freq: Every day | ORAL | 5 refills | Status: DC

## 2018-08-04 NOTE — Progress Notes (Signed)
Advanced Heart Failure Clinic Note   Referring Physician: PCP: Glendale Chard, MD PCP-Cardiologist: Quay Burow, MD   HPI:  Patricia Mata is a 63 y.o. 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 Followed by Dr Gwenlyn Found for "severe" AS and pulmonary HTN.  Admitted on 10/27 with AF with RVR and recurrent anemia. Developed respiratory failure and intubated. She was transfused and placed on IV amio. Subsequently developed AKI/ESRD and placed on CVVHD. Converted to NSR on amio and weaned off the vent. Renal function now back to normal and off CVVHD. Transitioned to PO amiodarone after appropriate time. She was not put on [facility] due to chronic anemia requiring transfusions.  Echoshowed normal LVEF 65-70% with moderate AS and moderate MS and severe PH with RVSP 61mmHG. She is not a candidate for intervention of AS or MS with advanced PAH.  Underwent R/LHC that showed normal coronaries and severely elevated PA pressures. Full results below. AHF team was consulted for further management of [place]. V/Q scan was negative. She was started on sildenafil and tolerated well. She diuresed with IV lasix and transitioned to lasix 40 mg PO BID. She did not require O2 at discharge.   She required a total of 9 units of pRBCs while admitted. She requires transfusions every 2-3 weeks as an outpatient. She also developed a buttock wound. WOC was consulted and recommended hydrotherapy.   Palliative care was consulted and decision was made to transition to DNR. Yellow DNR form signed for DC. PT and OT recommended SNF at discharge. She will have palliative at SNF with possible transition to hospice eventually.  Pt admitted 11/25 - 12/2 with symptomatic anemia requiring transfusion. (in the setting of MDS requiring transfusions every 2-3 weeks chronically). 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 ID followed and started on vanc/meropenem. Switched to ertapenem via PICC line to continue until 07/24/18.   She presents today for regular follow up. Given extra hydralazine over the weekend with SBP in 180s. BP in 160s on arrival today.  Got extra hydralazine over the weekend. She remains on oral ABX for her buttock wound and is followed by ID. [place] on discharge 08/10/2018 (Monday). She is not sure if she will have HHRN/PT or wound care. Denies fevers, chills, CP, or palpitations. Husband and Yolanda Bonine both present. Has questions about her prognosis.   EKG today with Sinus bradycardia 45 bpm, personally reviewed  Review of systems complete and found to be negative unless listed in HPI.    Past Medical History:  Diagnosis Date  . Cancer [facility]) 2008   Colon   . Diabetes mellitus without complication ([place])   . Hypertension   . Macrocytic anemia     Current Outpatient Medications  Medication Sig Dispense Refill  . amiodarone (PACERONE) 200 MG tablet Take 1 tablet (200 mg total) by mouth 2 (two) times daily. 60 tablet 3  . amLODipine (NORVASC) 2.5 MG tablet Take 2.5 mg by mouth daily.     Marland Kitchen atorvastatin (LIPITOR) 40 MG tablet TAKE 1 TABLET BY MOUTH EVERY DAY 90 tablet 0  . hydrALAZINE (APRESOLINE) 100 MG tablet Take 1 tablet (100 mg total) by mouth 3 (three) times daily. 90 tablet 6  . insulin glargine (LANTUS) 100 unit/mL SOPN Inject 0.14 mLs (14 Units total) into the skin daily after breakfast. 15 mL 11  .  Insulin Pen Needle 29G X 5MM MISC Use as directed 200 each 0  . JANUMET XR 50-500 MG TB24 Take 1 tablet by mouth daily. With evening meal  2  . latanoprost (XALATAN) 0.005 % ophthalmic solution Place 1 drop into both eyes at bedtime.     Marland Kitchen levothyroxine (SYNTHROID, LEVOTHROID) 25 MCG tablet Take 1 tablet (25 mcg total) by mouth daily at 6 (six) AM.    . metoprolol succinate (TOPROL-XL)  25 MG 24 hr tablet Take 1 tablet (25 mg total) by mouth 2 (two) times daily. 30 tablet 5  . Multiple Vitamins-Minerals (CENTRUM SILVER 50+WOMEN) TABS Take 1 tablet by mouth daily.    Marland Kitchen oxycodone-acetaminophen (PERCOCET) 2.5-325 MG tablet Take 1 tablet by mouth every 8 (eight) hours as needed for pain. 15 tablet 0  . pantoprazole (PROTONIX) 40 MG tablet Take 1 tablet (40 mg total) by mouth daily. 30 tablet 5  . senna (SENOKOT) 8.6 MG TABS tablet Take 2 tablets (17.2 mg total) by mouth at bedtime. 120 each 0  . sildenafil (REVATIO) 20 MG tablet Take 1 tablet (20 mg total) by mouth 3 (three) times daily. 90 tablet 5  . spironolactone (ALDACTONE) 25 MG tablet Take 0.5 tablets (12.5 mg total) by mouth daily. 15 tablet 5  . traMADol (ULTRAM) 50 MG tablet Take 1 tablet (50 mg total) by mouth daily. 30 tablet 0  . collagenase (SANTYL) ointment Apply topically daily. 15 g 0  . feeding supplement, GLUCERNA SHAKE, (GLUCERNA SHAKE) LIQD Take 237 mLs by mouth 3 (three) times daily between meals. 90 Can 0  . furosemide (LASIX) 20 MG tablet TAKE 1 TABLET BY MOUTH EVERY DAY 90 tablet 0  . furosemide (LASIX) 40 MG tablet Take 1 tablet (40 mg total) by mouth daily. 60 tablet 5  . mupirocin ointment (BACTROBAN) 2 % Place 1 application into the nose daily.    . polyethylene glycol (MIRALAX / GLYCOLAX) packet Take 17 g by mouth 2 (two) times daily. 14 each 0  . potassium chloride SA (K-DUR,KLOR-CON) 20 MEQ tablet Take 1 tablet (20 mEq total) by mouth once for 1 dose. 60 tablet 5  . sodium hypochlorite (DAKIN'S 1/2 STRENGTH) external solution Irrigate with 1 application as directed daily.     No current facility-administered medications for this encounter.    Facility-Administered Medications Ordered in Other Encounters  Medication Dose Route Frequency Provider Last Rate Last Dose  . sodium chloride flush (NS) 0.9 % injection 10 mL  10 mL Intravenous PRN [name], MD        Allergies  Allergen Reactions    . Ancef [Cefazolin] Itching    Severe itching- after procedure, ancef was the antibiotic.-04/02/17  . Sulfa Antibiotics Rash and Other (See Comments)    Blisters, also      Social History   Socioeconomic History  . Marital status: Married    Spouse name: Not on file  . Number of children: Not on file  . Years of education: Not on file  . Highest education level: Not on file  Occupational History  . Not on file  Social Needs  . Financial resource strain: Not on file  . Food insecurity:    Worry: Not on file    Inability: Not on file  . Transportation needs:    Medical: Not on file    Non-medical: Not on file  Tobacco Use  . Smoking status: Former Smoker    Types: Cigarettes    Last attempt to  quit: 01/31/2001    Years since quitting: 17.5  . Smokeless tobacco: Former Network engineer and Sexual Activity  . Alcohol use: Yes    Alcohol/week: 2.0 standard drinks    Types: 2 Shots of liquor per week  . Drug use: Never  . Sexual activity: Not on file  Lifestyle  . Physical activity:    Days per week: Not on file    Minutes per session: Not on file  . Stress: Not on file  Relationships  . Social connections:    Talks on phone: Not on file    Gets together: Not on file    Attends religious service: Not on file    Active member of club or organization: Not on file    Attends meetings of clubs or organizations: Not on file    Relationship status: Not on file  . Intimate partner violence:    Fear of current or ex partner: Not on file    Emotionally abused: Not on file    Physically abused: Not on file    Forced sexual activity: Not on file  Other Topics Concern  . Not on file  Social History Narrative  . Not on file      Family History  Problem Relation Age of Onset  . Hypertension Mother   . Heart attack Father   . Hypertension Sister   . Hypertension Brother   . Hypertension Sister   . Hypertension Sister   . Prostate cancer Brother   . HIV/AIDS Brother     Vitals:   08/04/18 1121  BP: (!) 166/70  Pulse: (!) 52  SpO2: 100%  Weight: 83.9 kg (185 lb)    Wt Readings from Last 3 Encounters:  08/04/18 83.9 kg (185 lb)  07/24/18 85.3 kg (188 lb)  06/22/18 84 kg (185 lb 3.2 oz)    PHYSICAL EXAM: General: Well appearing. No resp difficulty. HEENT: Normal Neck: Supple. JVP 6-7 cm. Carotids 2+ bilat; no bruits. No thyromegaly or nodule noted. Cor: PMI nondisplaced. Regular, bradycardic, 3/6 systolic murmur that radiates to carotids.  Lungs: CTAB, normal effort. Abdomen: Soft, non-tender, non-distended, no HSM. No bruits or masses. +BS  Extremities: No cyanosis, clubbing, or rash. R and LLE no edema.  Neuro: Alert & orientedx3, cranial nerves grossly intact. moves all 4 extremities w/o difficulty. Affect pleasant   ASSESSMENT & PLAN:  1. PAH - Echo 05/12/18 LVEF 65-70%, Grade 2 DD, Mod AS, Mild/Mod MS, Mod TR, Trivial PI, PA peak pressure 93.  - L/RHC 05/21/18 with no significant CAD, severe pulmonary HTN with PAP 62 mmHg, PVR 5.4 WU.  - Severe, likely longstanding with NYHA III-IIIb symptoms.  - Suspect multifactorial in nature suspect mostly WHO group II (diastolic HF and valvular disease) and III (OSA) but given degree of PH cannot exclude WHO Group I component. PVR 5.4 WU - She is end stage. We are focusing on volume and symptom management.  - VQ negative - PFTs and assess need for home O2 - Continue sildenafil 20 mg TID cautiously. Tolerating ok - Volume status stable on exam.  - Continue lasix 40 mg daily. Will clarify dosing with facility. Can take additional 40 mg as needed.   2. Moderate AS/MS - She is not candidate for intervention with advanced PAH. No change.   3. PAF - Remains in NSR on amio. Did not tolerate AF well at all. - Continue amiodarone 200 mg BID for now. - Continue Toprol XL 25 mg BID as tolerated.  Borerline bradycardia, no room to increase.  - Would try and avoid anticoagulation with transfusion dependent  anemia. (chronic).  - CHA2DS2/VASc is at least 4.   4. AKI - Cr 1.6 07/31/2018. Recheck 1 week.   5. Transfusion dependent anemia in setting of Myelodysplastic syndrome -Transfuse as needed per [place].  - Hgb 8.1 07/31/2018.   6. Buttock wound - Wound care following at facility.  - Healing well. Remains on Cipro BID x 30 days per ID.   7. HTN - Continue hydral 100 mg TID.  - Increase amlodipine to 5 mg daily.  8. Hyperkalemia - K 4.2 07/31/2018.   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  - ID and plastic surgery consulted. Patient underwent bone biopsy 11/29. Sent for culture.   Cultures grew  Morganella morganii.  Patient was initially started on vancomycin and meropenem.   - Completed ABX on ertapenem January 10 via PICC line.  - Remains on Cipro as above.   10. Bradycardia - EKG today with Sinus bradycardia 45 bpm, personally reviewed - Will decrease amiodarone and Toprol as above. If HR remains low, will need to cut back Toprol further.  - Will ask facility if they can repeat EKG later this week.   Doing well overall. About to leave SNF. Bradycardia noted and meds adjusted. Relatively asymptomatic. She would not be a candidate at this time for PPM with active wounds and infection. Low threshold to stop BB entirely. Will ask SNF to repeat EKG, and if they can't will bring back for RN visit. RTC 6-8 weeks. Sooner with symptoms.   Shirley Friar, PA-C 08/04/18   Greater than 50% of the 30 minute visit was spent in counseling/coordination of care regarding disease state education, salt/fluid restriction, sliding scale diuretics, and medication compliance.

## 2018-08-04 NOTE — Patient Instructions (Signed)
EKG was done today.  DECREASE Amiodarone to 200mg  (1 tab) each day.  INCREASE Amlodipine to 5mg  (1 tab) each day.  All other medication prescriptions have been sent to your pharmacy.   Your physician recommends that you schedule a follow-up appointment in 8 weeks with Dr. Aundra Dubin.

## 2018-08-04 NOTE — Telephone Encounter (Signed)
I received call from [place] at [facility] to report BP 187/68. Per today's Advanced heart failure clinic note in response to elevated BP the patient's Norvasc was increased from 2.5 mg to 5 mg, however nurse at the health care center reports that the patient's Norvasc had already been increased to 5 mg several weeks ago by the facility provider.  I advised to give an extra 2.5 mg of Norvasc at this time for blood pressure.  Route this note to the heart failure clinic for further review.

## 2018-08-05 NOTE — Telephone Encounter (Signed)
Pt notified Verbalizes understanding 

## 2018-08-05 NOTE — Telephone Encounter (Signed)
OK to increase amlodipine to 10 mg daily.     Legrand Como [address] [place], PA-C 08/05/2018 7:16 AM

## 2018-08-07 ENCOUNTER — Other Ambulatory Visit (HOSPITAL_COMMUNITY): Payer: Self-pay

## 2018-08-07 MED ORDER — SILDENAFIL CITRATE 20 MG PO TABS: 20.0000 mg | ORAL_TABLET | Freq: Three times a day (TID) | ORAL | 0 refills | Status: DC

## 2018-08-07 MED ORDER — SPIRONOLACTONE 25 MG PO TABS: 12.5000 mg | ORAL_TABLET | Freq: Every day | ORAL | 5 refills | Status: DC

## 2018-08-07 MED ORDER — FUROSEMIDE 40 MG PO TABS: 40.0000 mg | ORAL_TABLET | Freq: Every day | ORAL | 5 refills | Status: DC

## 2018-08-07 NOTE — Telephone Encounter (Signed)
Son called, states patient is returning from nursing facility on Monday and needs refills for medicines.  Refills sent in. Will call office if he has any issues with refills.

## 2018-08-11 DIAGNOSIS — N189 Chronic kidney disease, unspecified: Secondary | ICD-10-CM

## 2018-08-11 DIAGNOSIS — D469 Myelodysplastic syndrome, unspecified: Secondary | ICD-10-CM

## 2018-08-11 DIAGNOSIS — M4628 Osteomyelitis of vertebra, sacral and sacrococcygeal region: Secondary | ICD-10-CM

## 2018-08-11 DIAGNOSIS — I5032 Chronic diastolic (congestive) heart failure: Secondary | ICD-10-CM

## 2018-08-11 DIAGNOSIS — I270 Primary pulmonary hypertension: Secondary | ICD-10-CM

## 2018-08-11 DIAGNOSIS — E039 Hypothyroidism, unspecified: Secondary | ICD-10-CM

## 2018-08-11 DIAGNOSIS — B964 Proteus (mirabilis) (morganii) as the cause of diseases classified elsewhere: Secondary | ICD-10-CM

## 2018-08-11 DIAGNOSIS — E1122 Type 2 diabetes mellitus with diabetic chronic kidney disease: Secondary | ICD-10-CM

## 2018-08-11 DIAGNOSIS — I080 Rheumatic disorders of both mitral and aortic valves: Secondary | ICD-10-CM

## 2018-08-11 DIAGNOSIS — L89154 Pressure ulcer of sacral region, stage 4: Secondary | ICD-10-CM

## 2018-08-11 DIAGNOSIS — I130 Hypertensive heart and chronic kidney disease with heart failure and stage 1 through stage 4 chronic kidney disease, or unspecified chronic kidney disease: Secondary | ICD-10-CM

## 2018-08-11 DIAGNOSIS — E1169 Type 2 diabetes mellitus with other specified complication: Secondary | ICD-10-CM | POA: Diagnosis not present

## 2018-08-14 ENCOUNTER — Encounter: Payer: Self-pay | Admitting: Infectious Disease

## 2018-08-14 ENCOUNTER — Ambulatory Visit (INDEPENDENT_AMBULATORY_CARE_PROVIDER_SITE_OTHER): Payer: BC Managed Care – PPO | Admitting: Infectious Disease

## 2018-08-14 VITALS — BP 181/68 | HR 50 | Temp 98.2°F | Wt 181.0 lb

## 2018-08-14 DIAGNOSIS — M86159 Other acute osteomyelitis, unspecified femur: Secondary | ICD-10-CM | POA: Diagnosis not present

## 2018-08-14 DIAGNOSIS — M109 Gout, unspecified: Secondary | ICD-10-CM

## 2018-08-14 DIAGNOSIS — A498 Other bacterial infections of unspecified site: Secondary | ICD-10-CM

## 2018-08-14 DIAGNOSIS — D469 Myelodysplastic syndrome, unspecified: Secondary | ICD-10-CM | POA: Diagnosis not present

## 2018-08-14 NOTE — Progress Notes (Signed)
Subjective:  Chief complaint follow-up for sacral decubitus ulcer which is improving, she does have some pain in her right ankle due to gout  Patient ID: Patricia Mata, female    DOB: 06-29-1956, 63 y.o.   MRN: [id number]  HPI  [name] is a 63 y.o. female history severe aortic stenosis, myelodysplastic syndrome colon cancer status post hemicolectomy who had protracted hospital stay in which she had severe atrial fibrillation and hypotension all failure requiring CVVHD and multiple transfusions and a protracted stay in the ICU.  She developed a decubitus ulcer that has become a stage IV ulcer and has been at skilled nursing facility.  Sent to emergency department on November 25 due to worsening bleeding and blood clots found in her sacral decubitus ulcer.  She was seen by her wound care specialist Dr. Renard Hamper who try to cauterize this area but it continued to bleed.  She was admitted via the emergency department initially given antimicrobials in the form of vancomycin and metronidazole.  These were discontinued faction felt not to be present in the soft tissues.  Ultimately an MRI was performed which showed evidence of early coccygeal osteomyelitis.  Discontinue her antibiotics that she was on in the hospital and facilitated consultation with plastic surgery and Dr. Marla Roe performed I&D bone biopsy from the coccygeal region and sent this for culture and this grew Morganella morganii.  We have been treating her with Invanz for now 6 weeks.  The wound has been decreasing size with increasing granulation tissue.  She has reduced pain posteriorly and is being able to offload the area.  She is currently residing at skilled nursing facility:    [facility]  I had been encouraged by her symptoms and the appearance of the wound   Over when I checked her inflammatory markers that had worsened and therefore I extended her antibiotics with oral ciprofloxacin.  Talking to her today she  did endorse the fact that she was having a gout flare in her right ankle and indeed she was tender to palpation there.  She states that she did not have a flare of this during the last visit when she did have elevated inflammatory markers.  She does also have a myelodysplastic syndrome not sure if that may not be playing a role in these inflammatory markers being elevated.  He has less and less pain in that area and the wound she believes is getting better.  Past Medical History:  Diagnosis Date  . Cancer [facility]) 2008   Colon   . Diabetes mellitus without complication ([place])   . Hypertension   . Macrocytic anemia     Past Surgical History:  Procedure Laterality Date  . COLON SURGERY    . DEBRIDMENT OF DECUBITUS ULCER N/A 06/12/2018   Procedure: DEBRIDMENT OF DECUBITUS ULCER;  Surgeon: Wallace Going, DO;  Location: [facility] OR[facility];  Service: Plastics;  Laterality: N/A;  . IR FLUORO GUIDE PORT INSERTION RIGHT  04/02/2017  . IR US GUIDE VASC ACCESS RIGHT  04/02/2017  . RIGHT/LEFT HEART CATH AND CORONARY ANGIOGRAPHY N/A 05/21/2018   Procedure: RIGHT/LEFT HEART CATH AND CORONARY ANGIOGRAPHY;  Surgeon: Nelva Bush, MD;  Location: [facility];  Service: Cardiovascular;  Laterality: N/A;    Family History  Problem Relation Age of Onset  . Hypertension Mother   . Heart attack Father   . Hypertension Sister   . Hypertension Brother   . Hypertension Sister   . Hypertension Sister   . Prostate  cancer Brother   . HIV/AIDS Brother       Social History   Socioeconomic History  . Marital status: Married    Spouse name: Not on file  . Number of children: Not on file  . Years of education: Not on file  . Highest education level: Not on file  Occupational History  . Not on file  Social Needs  . Financial resource strain: Not on file  . Food insecurity:    Worry: Not on file    Inability: Not on file  . Transportation needs:    Medical: Not on file    Non-medical: Not on file    Tobacco Use  . Smoking status: Former Smoker    Types: Cigarettes    Last attempt to quit: 01/31/2001    Years since quitting: 17.5  . Smokeless tobacco: Former Network engineer and Sexual Activity  . Alcohol use: Yes    Alcohol/week: 2.0 standard drinks    Types: 2 Shots of liquor per week  . Drug use: Never  . Sexual activity: Not on file  Lifestyle  . Physical activity:    Days per week: Not on file    Minutes per session: Not on file  . Stress: Not on file  Relationships  . Social connections:    Talks on phone: Not on file    Gets together: Not on file    Attends religious service: Not on file    Active member of club or organization: Not on file    Attends meetings of clubs or organizations: Not on file    Relationship status: Not on file  Other Topics Concern  . Not on file  Social History Narrative  . Not on file    Allergies  Allergen Reactions  . Ancef [Cefazolin] Itching    Severe itching- after procedure, ancef was the antibiotic.-04/02/17  . Sulfa Antibiotics Rash and Other (See Comments)    Blisters, also     Current Outpatient Medications:  .  amiodarone (PACERONE) 200 MG tablet, Take 1 tablet (200 mg total) by mouth daily., Disp: 30 tablet, Rfl: 3 .  amLODipine (NORVASC) 2.5 MG tablet, Take 2 tablets (5 mg total) by mouth daily., Disp: 30 tablet, Rfl: 3 .  atorvastatin (LIPITOR) 40 MG tablet, Take 1 tablet (40 mg total) by mouth daily., Disp: 90 tablet, Rfl: 0 .  collagenase (SANTYL) ointment, Apply topically daily., Disp: 15 g, Rfl: 0 .  feeding supplement, GLUCERNA SHAKE, (GLUCERNA SHAKE) LIQD, Take 237 mLs by mouth 3 (three) times daily between meals., Disp: 90 Can, Rfl: 0 .  furosemide (LASIX) 40 MG tablet, Take 1 tablet (40 mg total) by mouth daily., Disp: 30 tablet, Rfl: 5 .  hydrALAZINE (APRESOLINE) 100 MG tablet, Take 1 tablet (100 mg total) by mouth 3 (three) times daily., Disp: 90 tablet, Rfl: 6 .  insulin glargine (LANTUS) 100 unit/mL SOPN,  Inject 0.14 mLs (14 Units total) into the skin daily after breakfast., Disp: 15 mL, Rfl: 11 .  Insulin Pen Needle 29G X 5MM MISC, Use as directed, Disp: 200 each, Rfl: 0 .  JANUMET XR 50-500 MG TB24, Take 1 tablet by mouth daily. With evening meal, Disp: , Rfl: 2 .  latanoprost (XALATAN) 0.005 % ophthalmic solution, Place 1 drop into both eyes at bedtime. , Disp: , Rfl:  .  levothyroxine (SYNTHROID, LEVOTHROID) 25 MCG tablet, Take 1 tablet (25 mcg total) by mouth daily at 6 (six) AM., Disp: , Rfl:  .  metoprolol succinate (TOPROL-XL) 25 MG 24 hr tablet, Take 1 tablet (25 mg total) by mouth daily., Disp: 30 tablet, Rfl: 5 .  Multiple Vitamins-Minerals (CENTRUM SILVER 50+WOMEN) TABS, Take 1 tablet by mouth daily., Disp: , Rfl:  .  mupirocin ointment (BACTROBAN) 2 %, Place 1 application into the nose daily., Disp: , Rfl:  .  oxycodone-acetaminophen (PERCOCET) 2.5-325 MG tablet, Take 1 tablet by mouth every 8 (eight) hours as needed for pain., Disp: 15 tablet, Rfl: 0 .  pantoprazole (PROTONIX) 40 MG tablet, Take 1 tablet (40 mg total) by mouth daily., Disp: 30 tablet, Rfl: 5 .  polyethylene glycol (MIRALAX / GLYCOLAX) packet, Take 17 g by mouth 2 (two) times daily., Disp: 14 each, Rfl: 0 .  senna (SENOKOT) 8.6 MG TABS tablet, Take 2 tablets (17.2 mg total) by mouth at bedtime., Disp: 120 each, Rfl: 0 .  sildenafil (REVATIO) 20 MG tablet, Take 1 tablet (20 mg total) by mouth 3 (three) times daily., Disp: 90 tablet, Rfl: 0 .  sodium hypochlorite (DAKIN'S 1/2 STRENGTH) external solution, Irrigate with 1 application as directed daily., Disp: , Rfl:  .  spironolactone (ALDACTONE) 25 MG tablet, Take 0.5 tablets (12.5 mg total) by mouth daily., Disp: 15 tablet, Rfl: 5 .  traMADol (ULTRAM) 50 MG tablet, Take 1 tablet (50 mg total) by mouth daily., Disp: 30 tablet, Rfl: 0 .  ciprofloxacin (CIPRO) 500 MG tablet, , Disp: , Rfl:  .  potassium chloride SA (K-DUR,KLOR-CON) 20 MEQ tablet, Take 1 tablet (20 mEq total)  by mouth once for 1 dose., Disp: 60 tablet, Rfl: 5 No current facility-administered medications for this visit.   Facility-Administered Medications Ordered in Other Visits:  .  sodium chloride flush (NS) 0.9 % injection 10 mL, 10 mL, Intravenous, PRN, Alen Blew, Mathis Dad, MD   Review of Systems  Constitutional: Negative for activity change, appetite change, chills, diaphoresis, fatigue, fever and unexpected weight change.  HENT: Negative for congestion, rhinorrhea, sinus pressure, sneezing, sore throat and trouble swallowing.   Eyes: Negative for photophobia and visual disturbance.  Respiratory: Negative for cough, chest tightness, shortness of breath, wheezing and stridor.   Cardiovascular: Negative for chest pain, palpitations and leg swelling.  Gastrointestinal: Negative for abdominal distention, abdominal pain, anal bleeding, blood in stool, constipation, diarrhea, nausea and vomiting.  Genitourinary: Negative for difficulty urinating, dysuria, flank pain and hematuria.  Musculoskeletal: Positive for back pain. Negative for arthralgias, gait problem, joint swelling and myalgias.  Skin: Positive for wound. Negative for color change, pallor and rash.  Neurological: Negative for dizziness, tremors, weakness and light-headedness.  Hematological: Negative for adenopathy. Does not bruise/bleed easily.  Psychiatric/Behavioral: Negative for agitation, behavioral problems, confusion, decreased concentration, dysphoric mood and sleep disturbance.       Objective:   Physical Exam Constitutional:      General: She is not in acute distress.    Appearance: Normal appearance. She is well-developed. She is not ill-appearing or diaphoretic.  HENT:     Head: Normocephalic and atraumatic.     Right Ear: Hearing and external ear normal.     Left Ear: Hearing and external ear normal.     Nose: No nasal deformity or rhinorrhea.  Eyes:     General: No scleral icterus.    Conjunctiva/sclera: Conjunctivae  normal.     Right eye: Right conjunctiva is not injected.     Left eye: Left conjunctiva is not injected.  Neck:     Musculoskeletal: Normal range of motion and neck supple.  Vascular: No JVD.  Cardiovascular:     Rate and Rhythm: Normal rate and regular rhythm.     Heart sounds: S1 normal and S2 normal.  Pulmonary:     Effort: Pulmonary effort is normal.     Breath sounds: No wheezing.  Abdominal:     General: There is no distension.  Musculoskeletal: Normal range of motion.     Right shoulder: Normal.     Left shoulder: Normal.     Right hip: Normal.     Left hip: Normal.     Right knee: Normal.     Left knee: Normal.  Lymphadenopathy:     Head:     Right side of head: No submandibular, preauricular or posterior auricular adenopathy.     Left side of head: No submandibular, preauricular or posterior auricular adenopathy.     Cervical:     Right cervical: No superficial or deep cervical adenopathy.    Left cervical: No superficial or deep cervical adenopathy.  Skin:    General: Skin is warm and dry.     Coloration: Skin is not pale.     Findings: No abrasion, bruising, ecchymosis, erythema, lesion or rash.     Nails: There is no clubbing.   Neurological:     General: No focal deficit present.     Mental Status: She is alert and oriented to person, place, and time.     Sensory: No sensory deficit.     Coordination: Coordination normal.     Gait: Gait normal.  Psychiatric:        Attention and Perception: She is attentive.        Mood and Affect: Mood normal.        Speech: Speech normal.        Behavior: Behavior normal. Behavior is cooperative.        Thought Content: Thought content normal.        Judgment: Judgment normal.   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  Depending on the magnitude of these inflammatory markers I may continue on the ciprofloxacin and asked her to come back when her gout has resolved.  Myelodysplastic syndrome: Wonder if this may potentially be a confounder

## 2018-08-15 LAB — CBC WITH DIFFERENTIAL/PLATELET
Absolute Monocytes: 1274 cells/uL — ABNORMAL HIGH (ref 200–950)
Basophils Absolute: 27 cells/uL (ref 0–200)
Basophils Relative: 0.3 %
Eosinophils Absolute: 64 cells/uL (ref 15–500)
Eosinophils Relative: 0.7 %
HCT: 23.1 % — ABNORMAL LOW (ref 35.0–45.0)
Hemoglobin: 7.9 g/dL — ABNORMAL LOW (ref 11.7–15.5)
Lymphs Abs: 992 cells/uL (ref 850–3900)
MCH: 29.5 pg (ref 27.0–33.0)
MCHC: 34.2 g/dL (ref 32.0–36.0)
MCV: 86.2 fL (ref 80.0–100.0)
MPV: 12.9 fL — ABNORMAL HIGH (ref 7.5–12.5)
Monocytes Relative: 14 %
Neutro Abs: 6743 cells/uL (ref 1500–7800)
Neutrophils Relative %: 74.1 %
Platelets: 231 10*3/uL (ref 140–400)
RBC: 2.68 10*6/uL — ABNORMAL LOW (ref 3.80–5.10)
RDW: 18 % — ABNORMAL HIGH (ref 11.0–15.0)
Total Lymphocyte: 10.9 %
WBC: 9.1 10*3/uL (ref 3.8–10.8)

## 2018-08-15 LAB — C-REACTIVE PROTEIN: CRP: 19.6 mg/L — ABNORMAL HIGH (ref <8.0)

## 2018-08-15 LAB — BASIC METABOLIC PANEL WITH GFR
BUN/Creatinine Ratio: 25 (calc) — ABNORMAL HIGH (ref 6–22)
BUN: 68 mg/dL — ABNORMAL HIGH (ref 7–25)
CO2: 24 mmol/L (ref 20–32)
Calcium: 9.9 mg/dL (ref 8.6–10.4)
Chloride: 100 mmol/L (ref 98–110)
Creat: 2.75 mg/dL — ABNORMAL HIGH (ref 0.50–0.99)
GFR, Est African American: 20 mL/min/{1.73_m2} — ABNORMAL LOW (ref >=60)
GFR, Est Non African American: 18 mL/min/{1.73_m2} — ABNORMAL LOW (ref >=60)
Glucose, Bld: 191 mg/dL — ABNORMAL HIGH (ref 65–99)
Potassium: 4.2 mmol/L (ref 3.5–5.3)
Sodium: 136 mmol/L (ref 135–146)

## 2018-08-15 LAB — SEDIMENTATION RATE: Sed Rate: 68 mm/h — ABNORMAL HIGH (ref 0–30)

## 2018-08-17 ENCOUNTER — Other Ambulatory Visit: Payer: BC Managed Care – PPO

## 2018-08-17 ENCOUNTER — Telehealth: Payer: Self-pay

## 2018-08-17 ENCOUNTER — Inpatient Hospital Stay (HOSPITAL_BASED_OUTPATIENT_CLINIC_OR_DEPARTMENT_OTHER): Payer: BC Managed Care – PPO | Admitting: Oncology

## 2018-08-17 ENCOUNTER — Inpatient Hospital Stay: Payer: BC Managed Care – PPO | Attending: Oncology

## 2018-08-17 ENCOUNTER — Other Ambulatory Visit: Payer: Self-pay

## 2018-08-17 ENCOUNTER — Inpatient Hospital Stay: Payer: BC Managed Care – PPO

## 2018-08-17 VITALS — BP 140/40 | HR 48 | Temp 98.7°F | Resp 18 | Ht 64.0 in | Wt 181.0 lb

## 2018-08-17 DIAGNOSIS — D469 Myelodysplastic syndrome, unspecified: Secondary | ICD-10-CM

## 2018-08-17 DIAGNOSIS — I350 Nonrheumatic aortic (valve) stenosis: Secondary | ICD-10-CM | POA: Diagnosis not present

## 2018-08-17 DIAGNOSIS — Z794 Long term (current) use of insulin: Secondary | ICD-10-CM | POA: Insufficient documentation

## 2018-08-17 DIAGNOSIS — Z85038 Personal history of other malignant neoplasm of large intestine: Secondary | ICD-10-CM | POA: Diagnosis not present

## 2018-08-17 DIAGNOSIS — Z79899 Other long term (current) drug therapy: Secondary | ICD-10-CM | POA: Diagnosis not present

## 2018-08-17 DIAGNOSIS — Z9049 Acquired absence of other specified parts of digestive tract: Secondary | ICD-10-CM | POA: Insufficient documentation

## 2018-08-17 DIAGNOSIS — D649 Anemia, unspecified: Secondary | ICD-10-CM | POA: Diagnosis present

## 2018-08-17 DIAGNOSIS — I10 Essential (primary) hypertension: Secondary | ICD-10-CM | POA: Diagnosis not present

## 2018-08-17 DIAGNOSIS — D509 Iron deficiency anemia, unspecified: Secondary | ICD-10-CM

## 2018-08-17 DIAGNOSIS — L98499 Non-pressure chronic ulcer of skin of other sites with unspecified severity: Secondary | ICD-10-CM

## 2018-08-17 DIAGNOSIS — N189 Chronic kidney disease, unspecified: Secondary | ICD-10-CM

## 2018-08-17 DIAGNOSIS — D631 Anemia in chronic kidney disease: Secondary | ICD-10-CM

## 2018-08-17 LAB — CMP (CANCER CENTER ONLY)
ALT: 56 U/L — ABNORMAL HIGH (ref 0–44)
AST: 69 U/L — ABNORMAL HIGH (ref 15–41)
Albumin: 3.3 g/dL — ABNORMAL LOW (ref 3.5–5.0)
Alkaline Phosphatase: 129 U/L — ABNORMAL HIGH (ref 38–126)
Anion gap: 8 (ref 5–15)
BUN: 70 mg/dL — ABNORMAL HIGH (ref 8–23)
CO2: 26 mmol/L (ref 22–32)
Calcium: 9.5 mg/dL (ref 8.9–10.3)
Chloride: 104 mmol/L (ref 98–111)
Creatinine: 2.77 mg/dL — ABNORMAL HIGH (ref 0.44–1.00)
GFR, Est AFR Am: 20 mL/min — ABNORMAL LOW (ref >60)
GFR, Estimated: 17 mL/min — ABNORMAL LOW (ref >60)
Glucose, Bld: 92 mg/dL (ref 70–99)
Potassium: 4.1 mmol/L (ref 3.5–5.1)
Sodium: 138 mmol/L (ref 135–145)
Total Bilirubin: 0.7 mg/dL (ref 0.3–1.2)
Total Protein: 7.6 g/dL (ref 6.5–8.1)

## 2018-08-17 LAB — CBC WITH DIFFERENTIAL (CANCER CENTER ONLY)
Abs Immature Granulocytes: 0.01 10*3/uL (ref 0.00–0.07)
Basophils Absolute: 0 10*3/uL (ref 0.0–0.1)
Basophils Relative: 0 %
Eosinophils Absolute: 0.1 10*3/uL (ref 0.0–0.5)
Eosinophils Relative: 1 %
HCT: 20.6 % — ABNORMAL LOW (ref 36.0–46.0)
Hemoglobin: 6.9 g/dL — CL (ref 12.0–15.0)
Immature Granulocytes: 0 %
Lymphocytes Relative: 13 %
Lymphs Abs: 0.9 10*3/uL (ref 0.7–4.0)
MCH: 29.7 pg (ref 26.0–34.0)
MCHC: 33.5 g/dL (ref 30.0–36.0)
MCV: 88.8 fL (ref 80.0–100.0)
Monocytes Absolute: 1.2 10*3/uL — ABNORMAL HIGH (ref 0.1–1.0)
Monocytes Relative: 16 %
Neutro Abs: 5 10*3/uL (ref 1.7–7.7)
Neutrophils Relative %: 70 %
Platelet Count: 198 10*3/uL (ref 150–400)
RBC: 2.32 MIL/uL — ABNORMAL LOW (ref 3.87–5.11)
RDW: 19.4 % — ABNORMAL HIGH (ref 11.5–15.5)
WBC Count: 7.1 10*3/uL (ref 4.0–10.5)
nRBC: 0 % (ref 0.0–0.2)

## 2018-08-17 LAB — SAMPLE TO BLOOD BANK

## 2018-08-17 LAB — PREPARE RBC (CROSSMATCH)

## 2018-08-17 MED ORDER — ACETAMINOPHEN 325 MG PO TABS
650.0000 mg | ORAL_TABLET | Freq: Once | ORAL | Status: AC
  Administered 2018-08-17: 650 mg via ORAL

## 2018-08-17 MED ORDER — DIPHENHYDRAMINE HCL 25 MG PO CAPS
ORAL_CAPSULE | ORAL | Status: AC
  Filled 2018-08-17: qty 1

## 2018-08-17 MED ORDER — ACETAMINOPHEN 325 MG PO TABS
ORAL_TABLET | ORAL | Status: AC
  Filled 2018-08-17: qty 2

## 2018-08-17 MED ORDER — DIPHENHYDRAMINE HCL 25 MG PO CAPS
25.0000 mg | ORAL_CAPSULE | Freq: Once | ORAL | Status: AC
  Administered 2018-08-17: 25 mg via ORAL

## 2018-08-17 MED ORDER — SODIUM CHLORIDE 0.9% IV SOLUTION
250.0000 mL | Freq: Once | INTRAVENOUS | Status: AC
  Administered 2018-08-17: 250 mL via INTRAVENOUS
  Filled 2018-08-17: qty 250

## 2018-08-17 NOTE — Patient Instructions (Signed)
Blood Transfusion, Adult, Care After This sheet gives you information about how to care for yourself after your procedure. Your doctor may also give you more specific instructions. If you have problems or questions, contact your doctor. Follow these instructions at home:   Take over-the-counter and prescription medicines only as told by your doctor.  Go back to your normal activities as told by your doctor.  Follow instructions from your doctor about how to take care of the area where an IV tube was put into your vein (insertion site). Make sure you: ? Wash your hands with soap and water before you change your bandage (dressing). If there is no soap and water, use hand sanitizer. ? Change your bandage as told by your doctor.  Check your IV insertion site every day for signs of infection. Check for: ? More redness, swelling, or pain. ? More fluid or blood. ? Warmth. ? Pus or a bad smell. Contact a doctor if:  You have more redness, swelling, or pain around the IV insertion site.  You have more fluid or blood coming from the IV insertion site.  Your IV insertion site feels warm to the touch.  You have pus or a bad smell coming from the IV insertion site.  Your pee (urine) turns pink, red, or brown.  You feel weak after doing your normal activities. Get help right away if:  You have signs of a serious allergic or body defense (immune) system reaction, including: ? Itchiness. ? Hives. ? Trouble breathing. ? Anxiety. ? Pain in your chest or lower back. ? Fever, flushing, and chills. ? Fast pulse. ? Rash. ? Watery poop (diarrhea). ? Throwing up (vomiting). ? Dark pee. ? Serious headache. ? Dizziness. ? Stiff neck. ? Yellow color in your face or the white parts of your eyes (jaundice). Summary  After a blood transfusion, return to your normal activities as told by your doctor.  Every day, check for signs of infection where the IV tube was put into your vein.  Some  signs of infection are warm skin, more redness and pain, more fluid or blood, and pus or a bad smell where the needle went in.  Contact your doctor if you feel weak or have any unusual symptoms. This information is not intended to replace advice given to you by your health care provider. Make sure you discuss any questions you have with your health care provider. Document Released: 07/22/2014 Document Revised: 02/23/2016 Document Reviewed: 02/23/2016 Elsevier Interactive Patient Education  2019 Elsevier Inc.  

## 2018-08-17 NOTE — Progress Notes (Signed)
Hematology and Oncology Follow Up Visit  Patricia Mata [id number] 15-Mar-1956 63 y.o. 08/17/2018 9:18 AM   Principle Diagnosis: 63 year old woman with:   1.  Colon cancer diagnosed in 2008.  She presented with stage III disease without any evidence of relapse.  2.  Myelodysplastic syndrome (MDS) presented with anemia found to have IPSS-R score was 2.5  in 2017.    Prior Therapy: She is S/P hemicolectomy done in 11/2006. 1/13 lymph nodes are positive.  This was followed by 12 cycles of FOLFOX completed in 05/2007  Aranesp 300 mcg every 2 to 3 weeks therapy was ineffective to eliminate transfusion needs.  5-azacytidine at 75 mg/m square subcutaneously started in October 2018.  She is status post 7 cycles of therapy completed in April 2019.   Current therapy: Supportive care only.    Interim History:  Patricia Mata is here for a follow-up.  Since the last visit, she reports no major changes in her health.  She continues to notice improvement in her overall performance status and quality of life.  She has reasonable improvement in her ability to ambulate using a walker without any falls or syncope.  Her sacral wound continues to heal slowly.  She has not required any transfusions for over a month.   Patient denied any alteration mental status, neuropathy, confusion or dizziness.  Denies any headaches or lethargy.  Denies any night sweats, weight loss or changes in appetite.  Denied orthopnea, dyspnea on exertion or chest discomfort.  Denies shortness of breath, difficulty breathing hemoptysis or cough.  Denies any abdominal distention, nausea, early satiety or dyspepsia.  Denies any hematuria, frequency, dysuria or nocturia.  Denies any skin irritation, dryness or rash.  Denies any ecchymosis or petechiae.  Denies any lymphadenopathy or clotting.  Denies any heat or cold intolerance.  Denies any anxiety or depression.  Remaining review of system is negative.    Medications: I have reviewed the  patient's current medications.   Allergies:  Allergies  Allergen Reactions  . Ancef [Cefazolin] Itching    Severe itching- after procedure, ancef was the antibiotic.-04/02/17  . Sulfa Antibiotics Rash and Other (See Comments)    Blisters, also     Physical Exam:   Blood pressure (!) 140/40, pulse (!) 48, temperature 98.7 F (37.1 C), temperature source Oral, resp. rate 18, height 5\' 4"  (1.626 m), weight 181 lb (82.1 kg), SpO2 100 %.     ECOG: 2   General appearance: Comfortable appearing without any discomfort Head: Normocephalic without any trauma Oropharynx: Mucous membranes are moist and pink without any thrush or ulcers. Eyes: Pupils are equal and round reactive to light. Lymph nodes: No cervical, supraclavicular, inguinal or axillary lymphadenopathy.   Heart:regular rate and rhythm.  S1 and S2 with systolic ejection murmur noted. Lung: Clear without any rhonchi or wheezes.  No dullness to percussion. Abdomin: Soft, nontender, nondistended with good bowel sounds.  No hepatosplenomegaly. Musculoskeletal: No joint deformity or effusion.  Full range of motion noted. Neurological: No deficits noted on motor, sensory and deep tendon reflex exam. Skin: No petechial rash or dryness.  Appeared moist.           Lab Results: Lab Results  Component Value Date   WBC 9.1 08/14/2018   HGB 7.9 (L) 08/14/2018   HCT 23.1 (L) 08/14/2018   MCV 86.2 08/14/2018   PLT 231 08/14/2018     Chemistry      Component Value Date/Time   NA 136 08/14/2018 1147  NA 132 (L) 06/10/2017 1028   NA 134 (L) 05/26/2017 0949   K 4.2 08/14/2018 1147   K 4.0 05/26/2017 0949   CL 100 08/14/2018 1147   CL 96 (L) 03/31/2012 1513   CO2 24 08/14/2018 1147   CO2 24 05/26/2017 0949   BUN 68 (H) 08/14/2018 1147   BUN 23 06/10/2017 1028   BUN 29.2 (H) 05/26/2017 0949   CREATININE 2.75 (H) 08/14/2018 1147   CREATININE 1.4 (H) 05/26/2017 0949      Component Value Date/Time   CALCIUM 9.9  08/14/2018 1147   CALCIUM 8.9 05/26/2017 0949   ALKPHOS 165 (H) 07/31/2018 1114   ALKPHOS 157 (H) 05/26/2017 0949   AST 45 (H) 07/31/2018 1114   AST 29 05/26/2017 0949   ALT 41 07/31/2018 1114   ALT 23 05/26/2017 0949   BILITOT 0.9 07/31/2018 1114   BILITOT 2.19 (H) 05/26/2017 0949     Current Outpatient Medications on File Prior to Visit  Medication Sig Dispense Refill  . amiodarone (PACERONE) 200 MG tablet Take 1 tablet (200 mg total) by mouth daily. 30 tablet 3  . amLODipine (NORVASC) 2.5 MG tablet Take 2 tablets (5 mg total) by mouth daily. 30 tablet 3  . atorvastatin (LIPITOR) 40 MG tablet Take 1 tablet (40 mg total) by mouth daily. 90 tablet 0  . ciprofloxacin (CIPRO) 500 MG tablet     . collagenase (SANTYL) ointment Apply topically daily. 15 g 0  . feeding supplement, GLUCERNA SHAKE, (GLUCERNA SHAKE) LIQD Take 237 mLs by mouth 3 (three) times daily between meals. 90 Can 0  . furosemide (LASIX) 40 MG tablet Take 1 tablet (40 mg total) by mouth daily. 30 tablet 5  . hydrALAZINE (APRESOLINE) 100 MG tablet Take 1 tablet (100 mg total) by mouth 3 (three) times daily. 90 tablet 6  . insulin glargine (LANTUS) 100 unit/mL SOPN Inject 0.14 mLs (14 Units total) into the skin daily after breakfast. 15 mL 11  . Insulin Pen Needle 29G X 5MM MISC Use as directed 200 each 0  . JANUMET XR 50-500 MG TB24 Take 1 tablet by mouth daily. With evening meal  2  . latanoprost (XALATAN) 0.005 % ophthalmic solution Place 1 drop into both eyes at bedtime.     Marland Kitchen levothyroxine (SYNTHROID, LEVOTHROID) 25 MCG tablet Take 1 tablet (25 mcg total) by mouth daily at 6 (six) AM.    . metoprolol succinate (TOPROL-XL) 25 MG 24 hr tablet Take 1 tablet (25 mg total) by mouth daily. 30 tablet 5  . Multiple Vitamins-Minerals (CENTRUM SILVER 50+WOMEN) TABS Take 1 tablet by mouth daily.    . mupirocin ointment (BACTROBAN) 2 % Place 1 application into the nose daily.    Marland Kitchen oxycodone-acetaminophen (PERCOCET) 2.5-325 MG tablet  Take 1 tablet by mouth every 8 (eight) hours as needed for pain. 15 tablet 0  . pantoprazole (PROTONIX) 40 MG tablet Take 1 tablet (40 mg total) by mouth daily. 30 tablet 5  . polyethylene glycol (MIRALAX / GLYCOLAX) packet Take 17 g by mouth 2 (two) times daily. 14 each 0  . potassium chloride SA (K-DUR,KLOR-CON) 20 MEQ tablet Take 1 tablet (20 mEq total) by mouth once for 1 dose. 60 tablet 5  . senna (SENOKOT) 8.6 MG TABS tablet Take 2 tablets (17.2 mg total) by mouth at bedtime. 120 each 0  . sildenafil (REVATIO) 20 MG tablet Take 1 tablet (20 mg total) by mouth 3 (three) times daily. 90 tablet 0  . sodium  hypochlorite (DAKIN'S 1/2 STRENGTH) external solution Irrigate with 1 application as directed daily.    Marland Kitchen spironolactone (ALDACTONE) 25 MG tablet Take 0.5 tablets (12.5 mg total) by mouth daily. 15 tablet 5  . traMADol (ULTRAM) 50 MG tablet Take 1 tablet (50 mg total) by mouth daily. 30 tablet 0   Current Facility-Administered Medications on File Prior to Visit  Medication Dose Route Frequency Provider Last Rate Last Dose  . sodium chloride flush (NS) 0.9 % injection 10 mL  10 mL Intravenous PRN [name], MD         Impression and Plan:  63 year old woman with:   1.  Myelodysplastic syndrome diagnosed in July 2019 after presenting with symptomatic anemia and found to have IPSS-R score was 2.5.   He remains on supportive care only at this time.  She has received 5-azacytidine without any significant improvement in her transfusion needs.  Alternative therapies were reviewed today which includes Revlimid among others.  For the time being given her other comorbid conditions, will continue with supportive treatment.  2.  Anemia: Her hemoglobin is down to 6.9 although she is asymptomatic we have elected to proceed with 2 units of packed red cells to improve her wound healing as well as improve her quality of life.  3. Hypertension: Blood pressure remains adequate at this time without  any major changes.   4.  Aortic stenosis: Not a candidate for replacement without any evidence of heart failure.  5.  Sacral wound: Slow healing but improvement noted.  6.  Prognosis: Her performance status continues to improve despite her multiple comorbid conditions.  Treatment remains palliative however.  7. Follow-up: In 4 weeks to recheck her counts and transfuse as needed.  25  minutes was spent with the patient face-to-face today.  More than 50% of time was dedicated to discussing her disease status, laboratory data and arranging for transfusion and answering question regarding long-term plan of care.   Zola Button, MD 2/3/20209:18 AM

## 2018-08-17 NOTE — Telephone Encounter (Signed)
Printed avs and calender of upcoming appointment. Per / os

## 2018-08-18 ENCOUNTER — Telehealth: Payer: Self-pay | Admitting: *Deleted

## 2018-08-18 LAB — TYPE AND SCREEN
ABO/RH(D): A NEG
Antibody Screen: NEGATIVE
Unit division: 0
Unit division: 0

## 2018-08-18 LAB — BPAM RBC
Blood Product Expiration Date: 202002202359
Blood Product Expiration Date: 202002292359
ISSUE DATE / TIME: 202002031251
ISSUE DATE / TIME: 202002031251
Unit Type and Rh: 600
Unit Type and Rh: 600

## 2018-08-18 NOTE — Telephone Encounter (Signed)
-----  Message from Truman Hayward, MD sent at 08/18/2018 11:16 AM EST ----- Mrs. Dues sed rate (ESR) and CRP are coming down.  If she could come back into clinic once her gout is completely resolved and we can repeat these labs we can stop her antibiotics if they are reassuring

## 2018-08-18 NOTE — Telephone Encounter (Signed)
Perfect

## 2018-08-18 NOTE — Telephone Encounter (Signed)
RN reached out to the patient, relayed Dr Lucianne Lei Dam's message.  She verbalized understanding, states she will call call to make an appointment when this is resolved.  She says it is still sore today, about the same as during her visit. Landis Gandy, RN

## 2018-09-04 ENCOUNTER — Other Ambulatory Visit: Payer: Self-pay | Admitting: Nurse Practitioner

## 2018-09-07 ENCOUNTER — Telehealth: Payer: Self-pay | Admitting: Oncology

## 2018-09-07 NOTE — Telephone Encounter (Signed)
Per sch msg, patient wanted to reschedule 3/3 appt. Spoke with patient, informed no availability during time she wanted. Patient will keep appt the same.

## 2018-09-08 ENCOUNTER — Inpatient Hospital Stay: Payer: BC Managed Care – PPO | Admitting: Infectious Disease

## 2018-09-08 ENCOUNTER — Encounter: Payer: Self-pay | Admitting: Infectious Disease

## 2018-09-08 ENCOUNTER — Ambulatory Visit: Payer: BC Managed Care – PPO | Admitting: Infectious Disease

## 2018-09-08 VITALS — BP 160/60 | HR 51 | Temp 97.6°F | Wt 180.0 lb

## 2018-09-08 DIAGNOSIS — A498 Other bacterial infections of unspecified site: Secondary | ICD-10-CM

## 2018-09-08 DIAGNOSIS — D469 Myelodysplastic syndrome, unspecified: Secondary | ICD-10-CM | POA: Diagnosis not present

## 2018-09-08 DIAGNOSIS — M1000 Idiopathic gout, unspecified site: Secondary | ICD-10-CM

## 2018-09-08 DIAGNOSIS — M86159 Other acute osteomyelitis, unspecified femur: Secondary | ICD-10-CM | POA: Diagnosis not present

## 2018-09-08 DIAGNOSIS — Z95828 Presence of other vascular implants and grafts: Secondary | ICD-10-CM

## 2018-09-08 DIAGNOSIS — L89154 Pressure ulcer of sacral region, stage 4: Secondary | ICD-10-CM

## 2018-09-08 DIAGNOSIS — M109 Gout, unspecified: Secondary | ICD-10-CM | POA: Insufficient documentation

## 2018-09-08 HISTORY — DX: Gout, unspecified: M10.9

## 2018-09-08 NOTE — Progress Notes (Signed)
Subjective:  Chief complaint follow-up for sacral decubitus ulcer which is improving out is resolved.,  Patient ID: Patricia Mata, female    DOB: 01/05/1956, 63 y.o.   MRN: [id number]  HPI  Patricia Mata is a 63 y.o. female history severe aortic stenosis, myelodysplastic syndrome colon cancer status post hemicolectomy who had protracted hospital stay in which she had severe atrial fibrillation and hypotension all failure requiring CVVHD and multiple transfusions and a protracted stay in the ICU.  She developed a decubitus ulcer that has become a stage IV ulcer and has been at skilled nursing facility.  Sent to emergency department on November 25 due to worsening bleeding and blood clots found in her sacral decubitus ulcer.  She was seen by her wound care specialist Dr. Renard Hamper who try to cauterize this area but it continued to bleed.  She was admitted via the emergency department initially given antimicrobials in the form of vancomycin and metronidazole.  These were discontinued faction felt not to be present in the soft tissues.  Ultimately an MRI was performed which showed evidence of early coccygeal osteomyelitis.  Discontinue her antibiotics that she was on in the hospital and facilitated consultation with plastic surgery and Dr. Marla Roe performed I&D bone biopsy from the coccygeal region and sent this for culture and this grew Morganella morganii.  We have been treating her with Invanz for now 6 weeks.  The wound has been decreasing size with increasing granulation tissue.  She has reduced pain posteriorly and is being able to offload the area.  She is currently residing at skilled nursing facility:    [facility]  I had been encouraged by her symptoms and the appearance of the wound   Over when I checked her inflammatory markers that had worsened and therefore I extended her antibiotics with oral ciprofloxacin.  Talking to her last visit she did endorse the fact that she was  having a gout flare in her right ankle and indeed she was tender to palpation there.  She states that she did not have a flare of this during the l visit before then visit when she did have elevated inflammatory markers.  She does also have a myelodysplastic syndrome not sure if that may not be playing a role in these inflammatory markers being elevated.  Been off of ciprofloxacin from 6 February.  She has had no increase in pain in fact had decrease in pain at her site of her decubitus ulcer.  No fevers chills or other systemic symptoms  Past Medical History:  Diagnosis Date  . Cancer [facility]) 2008   Colon   . Diabetes mellitus without complication ([place])   . Hypertension   . Macrocytic anemia     Past Surgical History:  Procedure Laterality Date  . COLON SURGERY    . DEBRIDMENT OF DECUBITUS ULCER N/A 06/12/2018   Procedure: DEBRIDMENT OF DECUBITUS ULCER;  Surgeon: Wallace Going, DO;  Location: [facility];  Service: Plastics;  Laterality: N/A;  . IR FLUORO GUIDE PORT INSERTION RIGHT  04/02/2017  . IR US GUIDE VASC ACCESS RIGHT  04/02/2017  . RIGHT/LEFT HEART CATH AND CORONARY ANGIOGRAPHY N/A 05/21/2018   Procedure: RIGHT/LEFT HEART CATH AND CORONARY ANGIOGRAPHY;  Surgeon: Nelva Bush, MD;  Location: [place] [facility];  Service: Cardiovascular;  Laterality: N/A;    Family History  Problem Relation Age of Onset  . Hypertension Mother   . Heart attack Father   . Hypertension Sister   . Hypertension Brother   .  Hypertension Sister   . Hypertension Sister   . Prostate cancer Brother   . HIV/AIDS Brother       Social History   Socioeconomic History  . Marital status: Married    Spouse name: Not on file  . Number of children: Not on file  . Years of education: Not on file  . Highest education level: Not on file  Occupational History  . Not on file  Social Needs  . Financial resource strain: Not on file  . Food insecurity:    Worry: Not on file    Inability: Not on  file  . Transportation needs:    Medical: Not on file    Non-medical: Not on file  Tobacco Use  . Smoking status: Former Smoker    Types: Cigarettes    Last attempt to quit: 01/31/2001    Years since quitting: 17.6  . Smokeless tobacco: Former Network engineer and Sexual Activity  . Alcohol use: Yes    Alcohol/week: 2.0 standard drinks    Types: 2 Shots of liquor per week  . Drug use: Never  . Sexual activity: Not on file  Lifestyle  . Physical activity:    Days per week: Not on file    Minutes per session: Not on file  . Stress: Not on file  Relationships  . Social connections:    Talks on phone: Not on file    Gets together: Not on file    Attends religious service: Not on file    Active member of club or organization: Not on file    Attends meetings of clubs or organizations: Not on file    Relationship status: Not on file  Other Topics Concern  . Not on file  Social History Narrative  . Not on file    Allergies  Allergen Reactions  . Ancef [Cefazolin] Itching    Severe itching- after procedure, ancef was the antibiotic.-04/02/17  . Sulfa Antibiotics Rash and Other (See Comments)    Blisters, also     Current Outpatient Medications:  .  amiodarone (PACERONE) 200 MG tablet, Take 1 tablet (200 mg total) by mouth daily., Disp: 30 tablet, Rfl: 3 .  amLODipine (NORVASC) 2.5 MG tablet, Take 2 tablets (5 mg total) by mouth daily., Disp: 30 tablet, Rfl: 3 .  atorvastatin (LIPITOR) 40 MG tablet, Take 1 tablet (40 mg total) by mouth daily., Disp: 90 tablet, Rfl: 0 .  ciprofloxacin (CIPRO) 500 MG tablet, , Disp: , Rfl:  .  collagenase (SANTYL) ointment, Apply topically daily., Disp: 15 g, Rfl: 0 .  feeding supplement, GLUCERNA SHAKE, (GLUCERNA SHAKE) LIQD, Take 237 mLs by mouth 3 (three) times daily between meals., Disp: 90 Can, Rfl: 0 .  furosemide (LASIX) 40 MG tablet, Take 1 tablet (40 mg total) by mouth daily., Disp: 30 tablet, Rfl: 5 .  hydrALAZINE (APRESOLINE) 100 MG  tablet, Take 1 tablet (100 mg total) by mouth 3 (three) times daily., Disp: 90 tablet, Rfl: 6 .  insulin glargine (LANTUS) 100 unit/mL SOPN, Inject 0.14 mLs (14 Units total) into the skin daily after breakfast., Disp: 15 mL, Rfl: 11 .  Insulin Pen Needle 29G X 5MM MISC, Use as directed, Disp: 200 each, Rfl: 0 .  JANUMET XR 50-500 MG TB24, TAKE 1 TABLET BY MOUTH EVERY DAY WITH EVENING MEAL,SWALLOW WHOLE. DO NOT CRUSH, CHEW AND/OR DIVIDE, Disp: 30 tablet, Rfl: 0 .  latanoprost (XALATAN) 0.005 % ophthalmic solution, Place 1 drop into both eyes at bedtime. ,  Disp: , Rfl:  .  levothyroxine (SYNTHROID, LEVOTHROID) 25 MCG tablet, Take 1 tablet (25 mcg total) by mouth daily at 6 (six) AM., Disp: , Rfl:  .  metoprolol succinate (TOPROL-XL) 25 MG 24 hr tablet, Take 1 tablet (25 mg total) by mouth daily., Disp: 30 tablet, Rfl: 5 .  Multiple Vitamins-Minerals (CENTRUM SILVER 50+WOMEN) TABS, Take 1 tablet by mouth daily., Disp: , Rfl:  .  mupirocin ointment (BACTROBAN) 2 %, Place 1 application into the nose daily., Disp: , Rfl:  .  oxycodone-acetaminophen (PERCOCET) 2.5-325 MG tablet, Take 1 tablet by mouth every 8 (eight) hours as needed for pain., Disp: 15 tablet, Rfl: 0 .  pantoprazole (PROTONIX) 40 MG tablet, Take 1 tablet (40 mg total) by mouth daily., Disp: 30 tablet, Rfl: 5 .  polyethylene glycol (MIRALAX / GLYCOLAX) packet, Take 17 g by mouth 2 (two) times daily., Disp: 14 each, Rfl: 0 .  potassium chloride SA (K-DUR,KLOR-CON) 20 MEQ tablet, Take 1 tablet (20 mEq total) by mouth once for 1 dose., Disp: 60 tablet, Rfl: 5 .  senna (SENOKOT) 8.6 MG TABS tablet, Take 2 tablets (17.2 mg total) by mouth at bedtime., Disp: 120 each, Rfl: 0 .  sildenafil (REVATIO) 20 MG tablet, Take 1 tablet (20 mg total) by mouth 3 (three) times daily., Disp: 90 tablet, Rfl: 0 .  sodium hypochlorite (DAKIN'S 1/2 STRENGTH) external solution, Irrigate with 1 application as directed daily., Disp: , Rfl:  .  spironolactone  (ALDACTONE) 25 MG tablet, Take 0.5 tablets (12.5 mg total) by mouth daily., Disp: 15 tablet, Rfl: 5 .  traMADol (ULTRAM) 50 MG tablet, Take 1 tablet (50 mg total) by mouth daily., Disp: 30 tablet, Rfl: 0 No current facility-administered medications for this visit.   Facility-Administered Medications Ordered in Other Visits:  .  sodium chloride flush (NS) 0.9 % injection 10 mL, 10 mL, Intravenous, PRN, Alen Blew, Mathis Dad, MD   Review of Systems  Constitutional: Negative for activity change, appetite change, chills, diaphoresis, fatigue, fever and unexpected weight change.  HENT: Negative for congestion, dental problem, rhinorrhea, sinus pressure, sneezing, sore throat and trouble swallowing.   Eyes: Negative for photophobia and visual disturbance.  Respiratory: Negative for cough, chest tightness, shortness of breath, wheezing and stridor.   Cardiovascular: Negative for chest pain, palpitations and leg swelling.  Gastrointestinal: Negative for abdominal distention, abdominal pain, anal bleeding, blood in stool, constipation, diarrhea, nausea and vomiting.  Genitourinary: Negative for difficulty urinating, dysuria, flank pain and hematuria.  Musculoskeletal: Negative for arthralgias, gait problem, joint swelling and myalgias.  Skin: Positive for wound. Negative for color change, pallor and rash.  Neurological: Negative for dizziness, tremors, weakness and light-headedness.  Hematological: Negative for adenopathy. Does not bruise/bleed easily.  Psychiatric/Behavioral: Negative for agitation, behavioral problems, confusion, decreased concentration, dysphoric mood and sleep disturbance.       Objective:   Physical Exam Constitutional:      General: She is not in acute distress.    Appearance: Normal appearance. She is well-developed. She is not ill-appearing or diaphoretic.  HENT:     Head: Normocephalic and atraumatic.     Right Ear: Hearing and external ear normal.     Left Ear: Hearing and  external ear normal.     Nose: No nasal deformity or rhinorrhea.  Eyes:     General: No scleral icterus.    Conjunctiva/sclera: Conjunctivae normal.     Right eye: Right conjunctiva is not injected.     Left eye: Left conjunctiva  is not injected.  Neck:     Musculoskeletal: Normal range of motion and neck supple.     Vascular: No JVD.  Cardiovascular:     Rate and Rhythm: Normal rate and regular rhythm.     Heart sounds: S1 normal and S2 normal.  Pulmonary:     Effort: Pulmonary effort is normal.     Breath sounds: No wheezing.  Abdominal:     General: There is no distension.  Musculoskeletal: Normal range of motion.     Right shoulder: Normal.     Left shoulder: Normal.     Right hip: Normal.     Left hip: Normal.     Right knee: Normal.     Left knee: Normal.  Lymphadenopathy:     Head:     Right side of head: No submandibular, preauricular or posterior auricular adenopathy.     Left side of head: No submandibular, preauricular or posterior auricular adenopathy.     Cervical:     Right cervical: No superficial or deep cervical adenopathy.    Left cervical: No superficial or deep cervical adenopathy.  Skin:    General: Skin is warm and dry.     Coloration: Skin is not pale.     Findings: No abrasion, bruising, ecchymosis, erythema, lesion or rash.     Nails: There is no clubbing.   Neurological:     General: No focal deficit present.     Mental Status: She is alert and oriented to person, place, and time.     Sensory: No sensory deficit.     Coordination: Coordination normal.     Gait: Gait normal.  Psychiatric:        Attention and Perception: She is attentive.        Mood and Affect: Mood normal.        Speech: Speech normal.        Behavior: Behavior normal. Behavior is cooperative.        Thought Content: Thought content normal.        Judgment: Judgment normal.   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  And plan on seeing her back in 6 weeks time  Gout: Apparently not active but probably was a confounder with regards to her inflammatory markers in the past  Myelodysplastic syndrome: Wonder if this may potentially be a confounder

## 2018-09-09 ENCOUNTER — Other Ambulatory Visit: Payer: Self-pay

## 2018-09-09 ENCOUNTER — Encounter: Payer: Self-pay | Admitting: Nurse Practitioner

## 2018-09-09 ENCOUNTER — Ambulatory Visit: Payer: BC Managed Care – PPO | Admitting: Nurse Practitioner

## 2018-09-09 VITALS — BP 138/70 | HR 52 | Temp 97.8°F | Ht 64.8 in | Wt 181.2 lb

## 2018-09-09 DIAGNOSIS — I6523 Occlusion and stenosis of bilateral carotid arteries: Secondary | ICD-10-CM | POA: Diagnosis not present

## 2018-09-09 DIAGNOSIS — E039 Hypothyroidism, unspecified: Secondary | ICD-10-CM

## 2018-09-09 DIAGNOSIS — E1165 Type 2 diabetes mellitus with hyperglycemia: Secondary | ICD-10-CM | POA: Diagnosis not present

## 2018-09-09 DIAGNOSIS — D509 Iron deficiency anemia, unspecified: Secondary | ICD-10-CM

## 2018-09-09 DIAGNOSIS — L89154 Pressure ulcer of sacral region, stage 4: Secondary | ICD-10-CM

## 2018-09-09 DIAGNOSIS — Z09 Encounter for follow-up examination after completed treatment for conditions other than malignant neoplasm: Secondary | ICD-10-CM

## 2018-09-09 DIAGNOSIS — I10 Essential (primary) hypertension: Secondary | ICD-10-CM

## 2018-09-09 LAB — CBC WITH DIFFERENTIAL/PLATELET
Absolute Monocytes: 858 cells/uL (ref 200–950)
Basophils Absolute: 17 cells/uL (ref 0–200)
Basophils Relative: 0.3 %
Eosinophils Absolute: 17 cells/uL (ref 15–500)
Eosinophils Relative: 0.3 %
HCT: 22.6 % — ABNORMAL LOW (ref 35.0–45.0)
Hemoglobin: 7.5 g/dL — ABNORMAL LOW (ref 11.7–15.5)
Lymphs Abs: 487 cells/uL — ABNORMAL LOW (ref 850–3900)
MCH: 29.3 pg (ref 27.0–33.0)
MCHC: 33.2 g/dL (ref 32.0–36.0)
MCV: 88.3 fL (ref 80.0–100.0)
MPV: 13.5 fL — ABNORMAL HIGH (ref 7.5–12.5)
Monocytes Relative: 14.8 %
Neutro Abs: 4420 cells/uL (ref 1500–7800)
Neutrophils Relative %: 76.2 %
Platelets: 203 10*3/uL (ref 140–400)
RBC: 2.56 10*6/uL — ABNORMAL LOW (ref 3.80–5.10)
RDW: 17 % — ABNORMAL HIGH (ref 11.0–15.0)
Total Lymphocyte: 8.4 %
WBC: 5.8 10*3/uL (ref 3.8–10.8)

## 2018-09-09 LAB — BASIC METABOLIC PANEL WITH GFR
BUN/Creatinine Ratio: 28 (calc) — ABNORMAL HIGH (ref 6–22)
BUN: 70 mg/dL — ABNORMAL HIGH (ref 7–25)
CO2: 24 mmol/L (ref 20–32)
Calcium: 9.6 mg/dL (ref 8.6–10.4)
Chloride: 103 mmol/L (ref 98–110)
Creat: 2.48 mg/dL — ABNORMAL HIGH (ref 0.50–0.99)
GFR, Est African American: 23 mL/min/{1.73_m2} — ABNORMAL LOW (ref >=60)
GFR, Est Non African American: 20 mL/min/{1.73_m2} — ABNORMAL LOW (ref >=60)
Glucose, Bld: 90 mg/dL (ref 65–99)
Potassium: 4.3 mmol/L (ref 3.5–5.3)
Sodium: 139 mmol/L (ref 135–146)

## 2018-09-09 LAB — SEDIMENTATION RATE: Sed Rate: 51 mm/h — ABNORMAL HIGH (ref 0–30)

## 2018-09-09 LAB — C-REACTIVE PROTEIN: CRP: 3 mg/L (ref <8.0)

## 2018-09-09 NOTE — Progress Notes (Signed)
Subjective:     Patient ID: Patricia Mata , female    DOB: 12/03/1955 , 63 y.o.   MRN: [id number]   Chief Complaint  Patient presents with  . Hospitalization Follow-up    high blood pressure in lungs    HPI  Hospital follow up she had a brief hospital stay from in the ICU from 10/20-11/12 and readmitted in December for with bleeding from her Sacral wound thatdeveloped after her ICU stay when she was admitted from 10/20 to 11/12. She was discharged to rehab facility where she has been having blood clots found in the dressings.She required transfusion of PRBC every 2 to 3 weeks. Patient was hospitalized due to severe anemia and bleeding from her sacral wound.MRI was done which raised concern for coccygeal osteomyelitis. Patient seen by infectious disease. Plastic surgery was consulted and the she underwent bone biopsy. She was started on vancomycin and meropenem.  She is currently being seen by OT/PT.    She is having low heart rate as well reported by home health and PT  Sacral wound is improving, still going to the wound center. No longer taking antibiotics.     Past Medical History:  Diagnosis Date  . Cancer [facility]) 2008   Colon   . Diabetes mellitus without complication ([place])   . Gout 09/08/2018  . Hypertension   . Macrocytic anemia      Family History  Problem Relation Age of Onset  . Hypertension Mother   . Heart attack Father   . Hypertension Sister   . Hypertension Brother   . Hypertension Sister   . Hypertension Sister   . Prostate cancer Brother   . HIV/AIDS Brother      Current Outpatient Medications:  .  amiodarone (PACERONE) 200 MG tablet, Take 1 tablet (200 mg total) by mouth daily., Disp: 30 tablet, Rfl: 3 .  amLODipine (NORVASC) 2.5 MG tablet, Take 2 tablets (5 mg total) by mouth daily., Disp: 30 tablet, Rfl: 3 .  atorvastatin (LIPITOR) 40 MG tablet, Take 1 tablet (40 mg total) by mouth daily., Disp: 90 tablet, Rfl: 0 .  collagenase (SANTYL) ointment,  Apply topically daily., Disp: 15 g, Rfl: 0 .  feeding supplement, GLUCERNA SHAKE, (GLUCERNA SHAKE) LIQD, Take 237 mLs by mouth 3 (three) times daily between meals., Disp: 90 Can, Rfl: 0 .  furosemide (LASIX) 40 MG tablet, Take 1 tablet (40 mg total) by mouth daily., Disp: 30 tablet, Rfl: 5 .  hydrALAZINE (APRESOLINE) 100 MG tablet, Take 1 tablet (100 mg total) by mouth 3 (three) times daily., Disp: 90 tablet, Rfl: 6 .  insulin glargine (LANTUS) 100 unit/mL SOPN, Inject 0.14 mLs (14 Units total) into the skin daily after breakfast., Disp: 15 mL, Rfl: 11 .  Insulin Pen Needle 29G X 5MM MISC, Use as directed, Disp: 200 each, Rfl: 0 .  JANUMET XR 50-500 MG TB24, TAKE 1 TABLET BY MOUTH EVERY DAY WITH EVENING MEAL,SWALLOW WHOLE. DO NOT CRUSH, CHEW AND/OR DIVIDE, Disp: 30 tablet, Rfl: 0 .  latanoprost (XALATAN) 0.005 % ophthalmic solution, Place 1 drop into both eyes at bedtime. , Disp: , Rfl:  .  metoprolol succinate (TOPROL-XL) 25 MG 24 hr tablet, Take 1 tablet (25 mg total) by mouth daily., Disp: 30 tablet, Rfl: 5 .  Multiple Vitamins-Minerals (CENTRUM SILVER 50+WOMEN) TABS, Take 1 tablet by mouth daily., Disp: , Rfl:  .  mupirocin ointment (BACTROBAN) 2 %, Place 1 application into the nose daily., Disp: , Rfl:  .  pantoprazole (  PROTONIX) 40 MG tablet, Take 1 tablet (40 mg total) by mouth daily., Disp: 30 tablet, Rfl: 5 .  polyethylene glycol (MIRALAX / GLYCOLAX) packet, Take 17 g by mouth 2 (two) times daily., Disp: 14 each, Rfl: 0 .  senna (SENOKOT) 8.6 MG TABS tablet, Take 2 tablets (17.2 mg total) by mouth at bedtime., Disp: 120 each, Rfl: 0 .  sildenafil (REVATIO) 20 MG tablet, Take 1 tablet (20 mg total) by mouth 3 (three) times daily., Disp: 90 tablet, Rfl: 0 .  spironolactone (ALDACTONE) 25 MG tablet, Take 0.5 tablets (12.5 mg total) by mouth daily., Disp: 15 tablet, Rfl: 5 .  levothyroxine (SYNTHROID, LEVOTHROID) 25 MCG tablet, Take 1 tablet (25 mcg total) by mouth daily at 6 (six) AM., Disp: ,  Rfl:  .  oxycodone-acetaminophen (PERCOCET) 2.5-325 MG tablet, Take 1 tablet by mouth every 8 (eight) hours as needed for pain. (Patient not taking: Reported on 09/09/2018), Disp: 15 tablet, Rfl: 0 .  potassium chloride SA (K-DUR,KLOR-CON) 20 MEQ tablet, Take 1 tablet (20 mEq total) by mouth once for 1 dose., Disp: 60 tablet, Rfl: 5 .  sodium hypochlorite (DAKIN'S 1/2 STRENGTH) external solution, Irrigate with 1 application as directed daily., Disp: , Rfl:  .  traMADol (ULTRAM) 50 MG tablet, Take 1 tablet (50 mg total) by mouth daily. (Patient not taking: Reported on 09/09/2018), Disp: 30 tablet, Rfl: 0 No current facility-administered medications for this visit.   Facility-Administered Medications Ordered in Other Visits:  .  sodium chloride flush (NS) 0.9 % injection 10 mL, 10 mL, Intravenous, PRN, Alen Blew, Mathis Dad, MD   Allergies  Allergen Reactions  . Ancef [Cefazolin] Itching    Severe itching- after procedure, ancef was the antibiotic.-04/02/17  . Sulfa Antibiotics Rash and Other (See Comments)    Blisters, also     Review of Systems  Constitutional: Negative.  Negative for fatigue.  Respiratory: Negative for cough and stridor.   Cardiovascular: Negative.  Negative for chest pain, palpitations and leg swelling.  Endocrine: Negative for polydipsia, polyphagia and polyuria.  Neurological: Negative.  Negative for dizziness and headaches.  Hematological: Negative.  Negative for adenopathy. Does not bruise/bleed easily.     Today's Vitals   09/09/18 1101  BP: 138/70  Pulse: (!) 52  Temp: 97.8 F (36.6 C)  TempSrc: Oral  SpO2: 97%  Weight: 181 lb 3.2 oz (82.2 kg)  Height: 5' 4.8" (1.646 m)   Body mass index is 30.34 kg/m.   Objective:  Physical Exam Vitals signs reviewed.  Constitutional:      Appearance: Normal appearance.  Cardiovascular:     Rate and Rhythm: Bradycardia present.     Pulses: Normal pulses.     Heart sounds: Normal heart sounds. No murmur.  Pulmonary:      Effort: Pulmonary effort is normal. No respiratory distress.     Breath sounds: Normal breath sounds. No wheezing or rales.  Neurological:     Mental Status: She is alert.         Assessment And Plan:     1. Type 2 diabetes mellitus with hyperglycemia, without long-term current use of insulin (HCC)  Chronic, controlled  Continue with current medications  Encouraged to limit intake of sugary foods and drinks  Encouraged to increase physical activity to 150 minutes per week as tolerated - Hemoglobin A1c - BMP8+eGFR  2. Essential hypertension . B/P is controlled. Marland Kitchen Her heart rate has been low per her son, I will contact the cardiologist to make them  aware  . CMP ordered to check renal function.  . The importance of regular exercise and dietary modification was stressed to the patient.  . Stressed importance of losing ten percent of her body weight to help with B/P control.  . The weight loss would help with decreasing cardiac and cancer risk as well.  . I have explained to her due to the severity of her heart condition cardiology will make the decision on changing any medications . She would like to remove the DNR status from her chart she would like to be a FULL CODE - BMP8+eGFR  3. Bilateral carotid artery stenosis  This is severe and she is being managed by Cardiology  Murmur present  4. Acquired hypothyroidism Chronic, controlled Continue with current medications - TSH - T4, Free - T3  5. Iron deficiency anemia, unspecified iron deficiency anemia type  Chronic, she has required 2-3 blood transfusions while hospitalized and has continued to receive blood transfusions in at the cancer center  6. Decubitus ulcer of coccygeal region, stage 4 ([place])  Healing wound, she and her son report this is improving   She is being treated by the wound center  [name], FNP

## 2018-09-10 ENCOUNTER — Other Ambulatory Visit (HOSPITAL_COMMUNITY): Payer: Self-pay | Admitting: Student

## 2018-09-10 LAB — BMP8+EGFR
BUN/Creatinine Ratio: 28 (ref 12–28)
BUN: 68 mg/dL — ABNORMAL HIGH (ref 8–27)
CO2: 22 mmol/L (ref 20–29)
Calcium: 9.9 mg/dL (ref 8.7–10.3)
Chloride: 104 mmol/L (ref 96–106)
Creatinine, Ser: 2.42 mg/dL — ABNORMAL HIGH (ref 0.57–1.00)
GFR calc Af Amer: 24 mL/min/{1.73_m2} — ABNORMAL LOW (ref >59)
GFR calc non Af Amer: 21 mL/min/{1.73_m2} — ABNORMAL LOW (ref >59)
Glucose: 87 mg/dL (ref 65–99)
Potassium: 4.5 mmol/L (ref 3.5–5.2)
Sodium: 141 mmol/L (ref 134–144)

## 2018-09-10 LAB — TSH: TSH: 35.59 u[IU]/mL — ABNORMAL HIGH (ref 0.450–4.500)

## 2018-09-10 LAB — T4, FREE: Free T4: 1.36 ng/dL (ref 0.82–1.77)

## 2018-09-10 LAB — HEMOGLOBIN A1C
Est. average glucose Bld gHb Est-mCnc: 123 mg/dL
Hgb A1c MFr Bld: 5.9 % — ABNORMAL HIGH (ref 4.8–5.6)

## 2018-09-10 LAB — T3: T3, Total: 47 ng/dL — ABNORMAL LOW (ref 71–180)

## 2018-09-11 ENCOUNTER — Other Ambulatory Visit: Payer: Self-pay

## 2018-09-11 DIAGNOSIS — E039 Hypothyroidism, unspecified: Secondary | ICD-10-CM

## 2018-09-11 MED ORDER — LEVOTHYROXINE SODIUM 25 MCG PO TABS: 25.0000 ug | ORAL_TABLET | Freq: Every day | ORAL | Status: DC

## 2018-09-15 ENCOUNTER — Inpatient Hospital Stay: Payer: BC Managed Care – PPO

## 2018-09-15 ENCOUNTER — Inpatient Hospital Stay: Payer: BC Managed Care – PPO | Attending: Oncology

## 2018-09-15 ENCOUNTER — Other Ambulatory Visit: Payer: Self-pay

## 2018-09-15 VITALS — BP 161/81 | HR 48 | Temp 98.4°F | Resp 16 | Ht 64.8 in | Wt 183.3 lb

## 2018-09-15 DIAGNOSIS — D649 Anemia, unspecified: Secondary | ICD-10-CM | POA: Diagnosis present

## 2018-09-15 DIAGNOSIS — Z79899 Other long term (current) drug therapy: Secondary | ICD-10-CM | POA: Diagnosis not present

## 2018-09-15 DIAGNOSIS — I10 Essential (primary) hypertension: Secondary | ICD-10-CM | POA: Diagnosis not present

## 2018-09-15 DIAGNOSIS — N189 Chronic kidney disease, unspecified: Secondary | ICD-10-CM

## 2018-09-15 DIAGNOSIS — Z9049 Acquired absence of other specified parts of digestive tract: Secondary | ICD-10-CM | POA: Insufficient documentation

## 2018-09-15 DIAGNOSIS — I350 Nonrheumatic aortic (valve) stenosis: Secondary | ICD-10-CM | POA: Diagnosis not present

## 2018-09-15 DIAGNOSIS — D500 Iron deficiency anemia secondary to blood loss (chronic): Secondary | ICD-10-CM

## 2018-09-15 DIAGNOSIS — D631 Anemia in chronic kidney disease: Secondary | ICD-10-CM

## 2018-09-15 DIAGNOSIS — D469 Myelodysplastic syndrome, unspecified: Secondary | ICD-10-CM

## 2018-09-15 DIAGNOSIS — Z9221 Personal history of antineoplastic chemotherapy: Secondary | ICD-10-CM | POA: Insufficient documentation

## 2018-09-15 DIAGNOSIS — Z85038 Personal history of other malignant neoplasm of large intestine: Secondary | ICD-10-CM | POA: Insufficient documentation

## 2018-09-15 DIAGNOSIS — Z23 Encounter for immunization: Secondary | ICD-10-CM | POA: Insufficient documentation

## 2018-09-15 LAB — CBC WITH DIFFERENTIAL (CANCER CENTER ONLY)
Abs Immature Granulocytes: 0.05 10*3/uL (ref 0.00–0.07)
Basophils Absolute: 0 10*3/uL (ref 0.0–0.1)
Basophils Relative: 0 %
Eosinophils Absolute: 0.1 10*3/uL (ref 0.0–0.5)
Eosinophils Relative: 1 %
HCT: 20.8 % — ABNORMAL LOW (ref 36.0–46.0)
Hemoglobin: 6.7 g/dL — CL (ref 12.0–15.0)
Immature Granulocytes: 1 %
Lymphocytes Relative: 16 %
Lymphs Abs: 1.1 10*3/uL (ref 0.7–4.0)
MCH: 29.4 pg (ref 26.0–34.0)
MCHC: 32.2 g/dL (ref 30.0–36.0)
MCV: 91.2 fL (ref 80.0–100.0)
Monocytes Absolute: 1 10*3/uL (ref 0.1–1.0)
Monocytes Relative: 16 %
Neutro Abs: 4.4 10*3/uL (ref 1.7–7.7)
Neutrophils Relative %: 66 %
Platelet Count: 197 10*3/uL (ref 150–400)
RBC: 2.28 MIL/uL — ABNORMAL LOW (ref 3.87–5.11)
RDW: 19 % — ABNORMAL HIGH (ref 11.5–15.5)
WBC Count: 6.6 10*3/uL (ref 4.0–10.5)
nRBC: 0.6 % — ABNORMAL HIGH (ref 0.0–0.2)

## 2018-09-15 LAB — SAMPLE TO BLOOD BANK

## 2018-09-15 LAB — CMP (CANCER CENTER ONLY)
ALT: 37 U/L (ref 0–44)
AST: 47 U/L — ABNORMAL HIGH (ref 15–41)
Albumin: 3.7 g/dL (ref 3.5–5.0)
Alkaline Phosphatase: 119 U/L (ref 38–126)
Anion gap: 11 (ref 5–15)
BUN: 64 mg/dL — ABNORMAL HIGH (ref 8–23)
CO2: 25 mmol/L (ref 22–32)
Calcium: 9.7 mg/dL (ref 8.9–10.3)
Chloride: 102 mmol/L (ref 98–111)
Creatinine: 2.4 mg/dL — ABNORMAL HIGH (ref 0.44–1.00)
GFR, Est AFR Am: 24 mL/min — ABNORMAL LOW (ref >60)
GFR, Estimated: 21 mL/min — ABNORMAL LOW (ref >60)
Glucose, Bld: 225 mg/dL — ABNORMAL HIGH (ref 70–99)
Potassium: 4.8 mmol/L (ref 3.5–5.1)
Sodium: 138 mmol/L (ref 135–145)
Total Bilirubin: 1.1 mg/dL (ref 0.3–1.2)
Total Protein: 7.7 g/dL (ref 6.5–8.1)

## 2018-09-15 LAB — PREPARE RBC (CROSSMATCH)

## 2018-09-15 MED ORDER — FUROSEMIDE 10 MG/ML IJ SOLN
INTRAMUSCULAR | Status: AC
  Filled 2018-09-15: qty 2

## 2018-09-15 MED ORDER — ACETAMINOPHEN 325 MG PO TABS
650.0000 mg | ORAL_TABLET | Freq: Once | ORAL | Status: AC
  Administered 2018-09-15: 650 mg via ORAL

## 2018-09-15 MED ORDER — INFLUENZA VAC SPLIT QUAD 0.5 ML IM SUSY
0.5000 mL | PREFILLED_SYRINGE | Freq: Once | INTRAMUSCULAR | Status: AC
  Administered 2018-09-15: 0.5 mL via INTRAMUSCULAR

## 2018-09-15 MED ORDER — SODIUM CHLORIDE 0.9% IV SOLUTION
250.0000 mL | Freq: Once | INTRAVENOUS | Status: AC
  Administered 2018-09-15: 250 mL via INTRAVENOUS
  Filled 2018-09-15: qty 250

## 2018-09-15 MED ORDER — DIPHENHYDRAMINE HCL 25 MG PO CAPS
ORAL_CAPSULE | ORAL | Status: AC
  Filled 2018-09-15: qty 1

## 2018-09-15 MED ORDER — FUROSEMIDE 10 MG/ML IJ SOLN
20.0000 mg | Freq: Once | INTRAMUSCULAR | Status: AC
  Administered 2018-09-15: 20 mg via INTRAVENOUS

## 2018-09-15 MED ORDER — INFLUENZA VAC SPLIT QUAD 0.5 ML IM SUSY
PREFILLED_SYRINGE | INTRAMUSCULAR | Status: AC
  Filled 2018-09-15: qty 0.5

## 2018-09-15 MED ORDER — FUROSEMIDE 10 MG/ML IJ SOLN: 20.0000 mg | Freq: Once | INTRAMUSCULAR | Status: DC

## 2018-09-15 MED ORDER — DIPHENHYDRAMINE HCL 25 MG PO CAPS
25.0000 mg | ORAL_CAPSULE | Freq: Once | ORAL | Status: AC
  Administered 2018-09-15: 25 mg via ORAL

## 2018-09-15 MED ORDER — ACETAMINOPHEN 325 MG PO TABS
ORAL_TABLET | ORAL | Status: AC
  Filled 2018-09-15: qty 2

## 2018-09-15 NOTE — Progress Notes (Unsigned)
OK to transfuse 2 units here for hgb of 6.7 per MD Shadad.

## 2018-09-15 NOTE — Patient Instructions (Signed)
Blood Transfusion, Adult, Care After This sheet gives you information about how to care for yourself after your procedure. Your doctor may also give you more specific instructions. If you have problems or questions, contact your doctor. Follow these instructions at home:   Take over-the-counter and prescription medicines only as told by your doctor.  Go back to your normal activities as told by your doctor.  Follow instructions from your doctor about how to take care of the area where an IV tube was put into your vein (insertion site). Make sure you: ? Wash your hands with soap and water before you change your bandage (dressing). If there is no soap and water, use hand sanitizer. ? Change your bandage as told by your doctor.  Check your IV insertion site every day for signs of infection. Check for: ? More redness, swelling, or pain. ? More fluid or blood. ? Warmth. ? Pus or a bad smell. Contact a doctor if:  You have more redness, swelling, or pain around the IV insertion site.  You have more fluid or blood coming from the IV insertion site.  Your IV insertion site feels warm to the touch.  You have pus or a bad smell coming from the IV insertion site.  Your pee (urine) turns pink, red, or brown.  You feel weak after doing your normal activities. Get help right away if:  You have signs of a serious allergic or body defense (immune) system reaction, including: ? Itchiness. ? Hives. ? Trouble breathing. ? Anxiety. ? Pain in your chest or lower back. ? Fever, flushing, and chills. ? Fast pulse. ? Rash. ? Watery poop (diarrhea). ? Throwing up (vomiting). ? Dark pee. ? Serious headache. ? Dizziness. ? Stiff neck. ? Yellow color in your face or the white parts of your eyes (jaundice). Summary  After a blood transfusion, return to your normal activities as told by your doctor.  Every day, check for signs of infection where the IV tube was put into your vein.  Some  signs of infection are warm skin, more redness and pain, more fluid or blood, and pus or a bad smell where the needle went in.  Contact your doctor if you feel weak or have any unusual symptoms. This information is not intended to replace advice given to you by your health care provider. Make sure you discuss any questions you have with your health care provider. Document Released: 07/22/2014 Document Revised: 02/23/2016 Document Reviewed: 02/23/2016 Elsevier Interactive Patient Education  2019 Elsevier Inc.  

## 2018-09-16 LAB — TYPE AND SCREEN
ABO/RH(D): A NEG
Antibody Screen: NEGATIVE
Unit division: 0
Unit division: 0

## 2018-09-16 LAB — BPAM RBC
Blood Product Expiration Date: 202003312359
Blood Product Expiration Date: 202003312359
ISSUE DATE / TIME: 202003031400
ISSUE DATE / TIME: 202003031400
Unit Type and Rh: 600
Unit Type and Rh: 600

## 2018-09-30 ENCOUNTER — Other Ambulatory Visit: Payer: Self-pay

## 2018-09-30 DIAGNOSIS — E039 Hypothyroidism, unspecified: Secondary | ICD-10-CM

## 2018-09-30 MED ORDER — LEVOTHYROXINE SODIUM 25 MCG PO TABS: 25.0000 ug | ORAL_TABLET | Freq: Every day | ORAL | Status: DC

## 2018-10-01 ENCOUNTER — Other Ambulatory Visit: Payer: Self-pay

## 2018-10-01 DIAGNOSIS — E039 Hypothyroidism, unspecified: Secondary | ICD-10-CM

## 2018-10-01 MED ORDER — LEVOTHYROXINE SODIUM 25 MCG PO TABS: 25.0000 ug | ORAL_TABLET | Freq: Every day | ORAL | 1 refills | Status: DC

## 2018-10-01 MED ORDER — AMLODIPINE BESYLATE 2.5 MG PO TABS: 5.0000 mg | ORAL_TABLET | Freq: Every day | ORAL | 1 refills | Status: DC

## 2018-10-01 MED ORDER — BASAGLAR KWIKPEN 100 UNIT/ML ~~LOC~~ SOPN: 14.0000 [IU] | PEN_INJECTOR | Freq: Every day | SUBCUTANEOUS | 1 refills | Status: DC

## 2018-10-02 ENCOUNTER — Other Ambulatory Visit: Payer: Self-pay

## 2018-10-02 DIAGNOSIS — E039 Hypothyroidism, unspecified: Secondary | ICD-10-CM

## 2018-10-02 MED ORDER — LEVOTHYROXINE SODIUM 25 MCG PO TABS: 25.0000 ug | ORAL_TABLET | Freq: Every day | ORAL | 1 refills | Status: DC

## 2018-10-03 ENCOUNTER — Other Ambulatory Visit: Payer: Self-pay | Admitting: Nurse Practitioner

## 2018-10-05 ENCOUNTER — Other Ambulatory Visit: Payer: Self-pay

## 2018-10-05 DIAGNOSIS — I10 Essential (primary) hypertension: Secondary | ICD-10-CM

## 2018-10-05 DIAGNOSIS — E1165 Type 2 diabetes mellitus with hyperglycemia: Secondary | ICD-10-CM

## 2018-10-05 DIAGNOSIS — E039 Hypothyroidism, unspecified: Secondary | ICD-10-CM

## 2018-10-05 MED ORDER — GLUCOSE BLOOD VI STRP: ORAL_STRIP | 12 refills | Status: DC

## 2018-10-05 MED ORDER — LEVOTHYROXINE SODIUM 25 MCG PO TABS: 25.0000 ug | ORAL_TABLET | Freq: Every day | ORAL | 1 refills | Status: DC

## 2018-10-05 MED ORDER — AMLODIPINE BESYLATE 5 MG PO TABS: 5.0000 mg | ORAL_TABLET | Freq: Every day | ORAL | 0 refills | Status: DC

## 2018-10-05 MED ORDER — BASAGLAR KWIKPEN 100 UNIT/ML ~~LOC~~ SOPN: 14.0000 [IU] | PEN_INJECTOR | Freq: Every day | SUBCUTANEOUS | 1 refills | Status: DC

## 2018-10-05 MED ORDER — METOPROLOL SUCCINATE ER 25 MG PO TB24: 25.0000 mg | ORAL_TABLET | Freq: Every day | ORAL | 5 refills | Status: DC

## 2018-10-06 ENCOUNTER — Encounter (HOSPITAL_COMMUNITY): Payer: BC Managed Care – PPO | Admitting: Internal Medicine

## 2018-10-06 ENCOUNTER — Encounter: Payer: Self-pay | Admitting: Nurse Practitioner

## 2018-10-09 ENCOUNTER — Other Ambulatory Visit: Payer: Self-pay

## 2018-10-09 ENCOUNTER — Other Ambulatory Visit: Payer: BC Managed Care – PPO

## 2018-10-09 DIAGNOSIS — E039 Hypothyroidism, unspecified: Secondary | ICD-10-CM

## 2018-10-10 DIAGNOSIS — E669 Obesity, unspecified: Secondary | ICD-10-CM

## 2018-10-10 DIAGNOSIS — I270 Primary pulmonary hypertension: Secondary | ICD-10-CM

## 2018-10-10 DIAGNOSIS — Z87891 Personal history of nicotine dependence: Secondary | ICD-10-CM

## 2018-10-10 DIAGNOSIS — I130 Hypertensive heart and chronic kidney disease with heart failure and stage 1 through stage 4 chronic kidney disease, or unspecified chronic kidney disease: Secondary | ICD-10-CM

## 2018-10-10 DIAGNOSIS — I482 Chronic atrial fibrillation, unspecified: Secondary | ICD-10-CM

## 2018-10-10 DIAGNOSIS — L89154 Pressure ulcer of sacral region, stage 4: Secondary | ICD-10-CM | POA: Diagnosis not present

## 2018-10-10 DIAGNOSIS — I5032 Chronic diastolic (congestive) heart failure: Secondary | ICD-10-CM

## 2018-10-10 DIAGNOSIS — E1122 Type 2 diabetes mellitus with diabetic chronic kidney disease: Secondary | ICD-10-CM

## 2018-10-10 DIAGNOSIS — E039 Hypothyroidism, unspecified: Secondary | ICD-10-CM

## 2018-10-10 DIAGNOSIS — M4628 Osteomyelitis of vertebra, sacral and sacrococcygeal region: Secondary | ICD-10-CM

## 2018-10-10 DIAGNOSIS — Z794 Long term (current) use of insulin: Secondary | ICD-10-CM

## 2018-10-10 DIAGNOSIS — B964 Proteus (mirabilis) (morganii) as the cause of diseases classified elsewhere: Secondary | ICD-10-CM

## 2018-10-10 DIAGNOSIS — N189 Chronic kidney disease, unspecified: Secondary | ICD-10-CM

## 2018-10-10 DIAGNOSIS — E1169 Type 2 diabetes mellitus with other specified complication: Secondary | ICD-10-CM

## 2018-10-10 DIAGNOSIS — D469 Myelodysplastic syndrome, unspecified: Secondary | ICD-10-CM

## 2018-10-10 DIAGNOSIS — I080 Rheumatic disorders of both mitral and aortic valves: Secondary | ICD-10-CM

## 2018-10-10 LAB — THYROID PANEL WITH TSH
Free Thyroxine Index: 3.6 (ref 1.2–4.9)
T3 Uptake Ratio: 45 % — ABNORMAL HIGH (ref 24–39)
T4, Total: 8 ug/dL (ref 4.5–12.0)
TSH: 48.59 u[IU]/mL — ABNORMAL HIGH (ref 0.450–4.500)

## 2018-10-12 ENCOUNTER — Inpatient Hospital Stay (HOSPITAL_BASED_OUTPATIENT_CLINIC_OR_DEPARTMENT_OTHER): Payer: BC Managed Care – PPO | Admitting: Oncology

## 2018-10-12 ENCOUNTER — Telehealth: Payer: Self-pay | Admitting: Oncology

## 2018-10-12 ENCOUNTER — Other Ambulatory Visit: Payer: Self-pay

## 2018-10-12 ENCOUNTER — Inpatient Hospital Stay: Payer: BC Managed Care – PPO

## 2018-10-12 VITALS — BP 153/39 | HR 50 | Temp 97.9°F | Resp 17 | Ht 64.8 in | Wt 178.7 lb

## 2018-10-12 DIAGNOSIS — D631 Anemia in chronic kidney disease: Secondary | ICD-10-CM

## 2018-10-12 DIAGNOSIS — I10 Essential (primary) hypertension: Secondary | ICD-10-CM

## 2018-10-12 DIAGNOSIS — Z79899 Other long term (current) drug therapy: Secondary | ICD-10-CM

## 2018-10-12 DIAGNOSIS — Z9221 Personal history of antineoplastic chemotherapy: Secondary | ICD-10-CM

## 2018-10-12 DIAGNOSIS — N189 Chronic kidney disease, unspecified: Secondary | ICD-10-CM

## 2018-10-12 DIAGNOSIS — D649 Anemia, unspecified: Secondary | ICD-10-CM

## 2018-10-12 DIAGNOSIS — I350 Nonrheumatic aortic (valve) stenosis: Secondary | ICD-10-CM

## 2018-10-12 DIAGNOSIS — Z9049 Acquired absence of other specified parts of digestive tract: Secondary | ICD-10-CM

## 2018-10-12 DIAGNOSIS — D469 Myelodysplastic syndrome, unspecified: Secondary | ICD-10-CM | POA: Diagnosis not present

## 2018-10-12 DIAGNOSIS — Z85038 Personal history of other malignant neoplasm of large intestine: Secondary | ICD-10-CM

## 2018-10-12 LAB — CBC WITH DIFFERENTIAL (CANCER CENTER ONLY)
Abs Immature Granulocytes: 0.03 10*3/uL (ref 0.00–0.07)
Basophils Absolute: 0 10*3/uL (ref 0.0–0.1)
Basophils Relative: 0 %
Eosinophils Absolute: 0.1 10*3/uL (ref 0.0–0.5)
Eosinophils Relative: 2 %
HCT: 22.7 % — ABNORMAL LOW (ref 36.0–46.0)
Hemoglobin: 7.2 g/dL — ABNORMAL LOW (ref 12.0–15.0)
Immature Granulocytes: 1 %
Lymphocytes Relative: 11 %
Lymphs Abs: 0.7 10*3/uL (ref 0.7–4.0)
MCH: 29.6 pg (ref 26.0–34.0)
MCHC: 31.7 g/dL (ref 30.0–36.0)
MCV: 93.4 fL (ref 80.0–100.0)
Monocytes Absolute: 0.9 10*3/uL (ref 0.1–1.0)
Monocytes Relative: 14 %
Neutro Abs: 4.4 10*3/uL (ref 1.7–7.7)
Neutrophils Relative %: 72 %
Platelet Count: 186 10*3/uL (ref 150–400)
RBC: 2.43 MIL/uL — ABNORMAL LOW (ref 3.87–5.11)
RDW: 19.2 % — ABNORMAL HIGH (ref 11.5–15.5)
WBC Count: 6.1 10*3/uL (ref 4.0–10.5)
nRBC: 0 % (ref 0.0–0.2)

## 2018-10-12 LAB — CMP (CANCER CENTER ONLY)
ALT: 27 U/L (ref 0–44)
AST: 37 U/L (ref 15–41)
Albumin: 3.9 g/dL (ref 3.5–5.0)
Alkaline Phosphatase: 88 U/L (ref 38–126)
Anion gap: 12 (ref 5–15)
BUN: 84 mg/dL — ABNORMAL HIGH (ref 8–23)
CO2: 24 mmol/L (ref 22–32)
Calcium: 10.1 mg/dL (ref 8.9–10.3)
Chloride: 102 mmol/L (ref 98–111)
Creatinine: 3.08 mg/dL (ref 0.44–1.00)
GFR, Est AFR Am: 18 mL/min — ABNORMAL LOW (ref >60)
GFR, Estimated: 15 mL/min — ABNORMAL LOW (ref >60)
Glucose, Bld: 96 mg/dL (ref 70–99)
Potassium: 4.2 mmol/L (ref 3.5–5.1)
Sodium: 138 mmol/L (ref 135–145)
Total Bilirubin: 0.9 mg/dL (ref 0.3–1.2)
Total Protein: 7.9 g/dL (ref 6.5–8.1)

## 2018-10-12 LAB — SAMPLE TO BLOOD BANK

## 2018-10-12 NOTE — Telephone Encounter (Signed)
No 3/30 los °

## 2018-10-12 NOTE — Progress Notes (Signed)
Hematology and Oncology Follow Up Visit  Patricia Mata [id number] 30-May-1956 63 y.o. 10/12/2018 9:35 AM   Principle Diagnosis: 63 year old woman with:   1.  Stage III colon cancer diagnosed in 2008.  Status post primary therapy without any evidence of disease at this time.  2.  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  She was found to have IPSS-R score was 2.5  in 2017.    Prior Therapy: She is S/P hemicolectomy done in 11/2006. 1/13 lymph nodes are positive.  This was followed by 12 cycles of FOLFOX completed in 05/2007  Aranesp 300 mcg every 2 to 3 weeks therapy was ineffective to eliminate transfusion needs.  5-azacytidine at 75 mg/m square subcutaneously started in October 2018.  She is status post 7 cycles of therapy completed in April 2019.   Current therapy: Packed red cell transfusion as needed.    Interim History:  Patricia Mata returns today for a repeat evaluation.  Since the last visit, she reports no major changes in her health.  She continues to feel reasonably well and denies any recent hospitalizations or illnesses.  She denies any excessive fatigue, tiredness.  She denies any chest pain or palpitation.  Her performance status and quality of life remains unchanged.   Patient denied headaches, blurry vision, syncope or seizures.  Denies any fevers, chills or sweats.  Denied chest pain, palpitation, orthopnea or leg edema.  Denied cough, wheezing or hemoptysis.  Denied nausea, vomiting or abdominal pain.  Denies any constipation or diarrhea.  Denies any frequency urgency or hesitancy.  Denies any arthralgias or myalgias.  Denies any skin rashes or lesions.  Denies any bleeding or clotting tendency.  Denies any easy bruising.  Denies any hair or nail changes.  Denies any anxiety or depression.  Remaining review of system is negative.    Medications: I have reviewed the patient's current medications.   Allergies:  Allergies  Allergen Reactions  .  Ancef [Cefazolin] Itching    Severe itching- after procedure, ancef was the antibiotic.-04/02/17  . Sulfa Antibiotics Rash and Other (See Comments)    Blisters, also     Physical Exam:   Blood pressure (!) 153/39, pulse (!) 50, temperature 97.9 F (36.6 C), temperature source Oral, resp. rate 17, height 5' 4.8" (1.646 m), weight 178 lb 11.2 oz (81.1 kg), SpO2 100 %.      ECOG: 2      General appearance: Alert, awake without any distress. Head: Atraumatic without abnormalities Oropharynx: Without any thrush or ulcers. Eyes: No scleral icterus. Lymph nodes: No lymphadenopathy noted in the cervical, supraclavicular, or axillary nodes Heart:regular rate and rhythm, without any murmurs or gallops.   Lung: Clear to auscultation without any rhonchi, wheezes or dullness to percussion. Abdomin: Soft, nontender without any shifting dullness or ascites. Musculoskeletal: No clubbing or cyanosis. Neurological: No motor or sensory deficits. Skin: No rashes or lesions.           Lab Results: Lab Results  Component Value Date   WBC 6.6 09/15/2018   HGB 6.7 (LL) 09/15/2018   HCT 20.8 (L) 09/15/2018   MCV 91.2 09/15/2018   PLT 197 09/15/2018     Chemistry      Component Value Date/Time   NA 138 09/15/2018 1126   NA 141 09/09/2018 1230   NA 134 (L) 05/26/2017 0949   K 4.8 09/15/2018 1126   K 4.0 05/26/2017 0949   CL 102 09/15/2018 1126   CL 96 (L) 03/31/2012  1513   CO2 25 09/15/2018 1126   CO2 24 05/26/2017 0949   BUN 64 (H) 09/15/2018 1126   BUN 68 (H) 09/09/2018 1230   BUN 29.2 (H) 05/26/2017 0949   CREATININE 2.40 (H) 09/15/2018 1126   CREATININE 2.48 (H) 09/08/2018 1315   CREATININE 1.4 (H) 05/26/2017 0949      Component Value Date/Time   CALCIUM 9.7 09/15/2018 1126   CALCIUM 8.9 05/26/2017 0949   ALKPHOS 119 09/15/2018 1126   ALKPHOS 157 (H) 05/26/2017 0949   AST 47 (H) 09/15/2018 1126   AST 29 05/26/2017 0949   ALT 37 09/15/2018 1126   ALT 23  05/26/2017 0949   BILITOT 1.1 09/15/2018 1126   BILITOT 2.19 (H) 05/26/2017 0949     Current Outpatient Medications on File Prior to Visit  Medication Sig Dispense Refill  . amiodarone (PACERONE) 200 MG tablet Take 1 tablet (200 mg total) by mouth daily. 30 tablet 3  . amLODipine (NORVASC) 5 MG tablet Take 1 tablet (5 mg total) by mouth daily. 90 tablet 0  . atorvastatin (LIPITOR) 40 MG tablet Take 1 tablet (40 mg total) by mouth daily. 90 tablet 0  . collagenase (SANTYL) ointment Apply topically daily. 15 g 0  . feeding supplement, GLUCERNA SHAKE, (GLUCERNA SHAKE) LIQD Take 237 mLs by mouth 3 (three) times daily between meals. 90 Can 0  . furosemide (LASIX) 40 MG tablet Take 1 tablet (40 mg total) by mouth daily. 30 tablet 5  . glucose blood test strip Use as instructed 100 each 12  . hydrALAZINE (APRESOLINE) 100 MG tablet Take 1 tablet (100 mg total) by mouth 3 (three) times daily. 90 tablet 6  . Insulin Glargine (BASAGLAR KWIKPEN) 100 UNIT/ML SOPN Inject 0.14 mLs (14 Units total) into the skin daily. 1 pen 1  . insulin glargine (LANTUS) 100 unit/mL SOPN Inject 0.14 mLs (14 Units total) into the skin daily after breakfast. 15 mL 11  . Insulin Pen Needle 29G X 5MM MISC Use as directed 200 each 0  . JANUMET XR 50-500 MG TB24 TAKE 1 TABLET BY MOUTH EVERY DAY WITH EVENING MEAL,SWALLOW WHOLE. DO NOT CRUSH, CHEW AND/OR DIVIDE 30 tablet 0  . latanoprost (XALATAN) 0.005 % ophthalmic solution Place 1 drop into both eyes at bedtime.     Marland Kitchen levothyroxine (SYNTHROID, LEVOTHROID) 25 MCG tablet Take 1 tablet (25 mcg total) by mouth daily at 6 (six) AM. Take ONE tablet (25 mcg) by mouth Monday- Friday. Take TWO tablets (50 mcg) on Saturday and Sunday. 32 tablet 1  . metoprolol succinate (TOPROL-XL) 25 MG 24 hr tablet Take 1 tablet (25 mg total) by mouth daily. 30 tablet 5  . Multiple Vitamins-Minerals (CENTRUM SILVER 50+WOMEN) TABS Take 1 tablet by mouth daily.    . mupirocin ointment (BACTROBAN) 2 % Place  1 application into the nose daily.    Marland Kitchen ofloxacin (OCUFLOX) 0.3 % ophthalmic solution Instill 1 drop TID in operative eye starting 2 days prior to surgery and after surgery for 3 weeks.    Marland Kitchen oxycodone-acetaminophen (PERCOCET) 2.5-325 MG tablet Take 1 tablet by mouth every 8 (eight) hours as needed for pain. (Patient not taking: Reported on 09/09/2018) 15 tablet 0  . pantoprazole (PROTONIX) 40 MG tablet Take 1 tablet (40 mg total) by mouth daily. 30 tablet 5  . polyethylene glycol (MIRALAX / GLYCOLAX) packet Take 17 g by mouth 2 (two) times daily. 14 each 0  . potassium chloride SA (K-DUR,KLOR-CON) 20 MEQ tablet Take 1 tablet (  20 mEq total) by mouth once for 1 dose. 60 tablet 5  . prednisoLONE acetate (PRED FORTE) 1 % ophthalmic suspension One drop in operative eye(s) TID starting 2 days before surgery and after surgery for 3 weeks    . senna (SENOKOT) 8.6 MG TABS tablet Take 2 tablets (17.2 mg total) by mouth at bedtime. 120 each 0  . sildenafil (REVATIO) 20 MG tablet TAKE ONE TABLET BY MOUTH THREE TIMES A DAY 90 tablet 0  . sodium hypochlorite (DAKIN'S 1/2 STRENGTH) external solution Irrigate with 1 application as directed daily.    Marland Kitchen spironolactone (ALDACTONE) 25 MG tablet Take 0.5 tablets (12.5 mg total) by mouth daily. 15 tablet 5  . traMADol (ULTRAM) 50 MG tablet Take 1 tablet (50 mg total) by mouth daily. (Patient not taking: Reported on 09/09/2018) 30 tablet 0   Current Facility-Administered Medications on File Prior to Visit  Medication Dose Route Frequency Provider Last Rate Last Dose  . sodium chloride flush (NS) 0.9 % injection 10 mL  10 mL Intravenous PRN [name], MD         Impression and Plan:  63 year old woman with:   1.  MDS diagnosed in July 2019.  She presented with IPSS-R score was 2.5.   She is status post therapy as outlined above remain on supportive care only.  She is not a candidate for any aggressive measures regarding her disease.  The natural course of this  disease was updated again and prognosis was also reviewed.  Risk of transformation to leukemia remains low but she might develop complications related to recurrent transfusion and cardiac complications.  2.  Anemia: Her hemoglobin is 7.2 and asymptomatic.  Risks and benefits of transfusion was reviewed today and we opted to defer transfusion at this time unless her hemoglobin drops below 7.  3. Hypertension: Blood pressure reasonably controlled at this time.   4.  Aortic stenosis: Followed by cardiology.  Not a candidate for aortic valve replacement.  5.  Sacral wound: Healed without any recurrent infections.  6.  Prognosis: Remains poor overall given her transfusion dependence and cardiac issues.  Her performance status remains adequate and continue current level of care.  7. Follow-up: In 3 weeks for potential transfusion and in 6 weeks for an MD follow-up.  25  minutes was spent with the patient face-to-face today.  More than 50% of time was spent on reviewing laboratory data, differential diagnosis, treatment options and discussing prognosis.   Zola Button, MD 3/30/20209:35 AM

## 2018-10-13 ENCOUNTER — Other Ambulatory Visit: Payer: Self-pay

## 2018-10-13 ENCOUNTER — Encounter: Payer: Self-pay | Admitting: Infectious Disease

## 2018-10-13 ENCOUNTER — Telehealth: Payer: Self-pay

## 2018-10-13 ENCOUNTER — Ambulatory Visit (INDEPENDENT_AMBULATORY_CARE_PROVIDER_SITE_OTHER): Payer: BC Managed Care – PPO | Admitting: Infectious Disease

## 2018-10-13 ENCOUNTER — Other Ambulatory Visit: Payer: Self-pay | Admitting: Nurse Practitioner

## 2018-10-13 VITALS — BP 165/55 | HR 61 | Temp 97.6°F | Wt 178.0 lb

## 2018-10-13 DIAGNOSIS — I10 Essential (primary) hypertension: Secondary | ICD-10-CM

## 2018-10-13 DIAGNOSIS — L89154 Pressure ulcer of sacral region, stage 4: Secondary | ICD-10-CM | POA: Diagnosis not present

## 2018-10-13 DIAGNOSIS — M86151 Other acute osteomyelitis, right femur: Secondary | ICD-10-CM | POA: Diagnosis not present

## 2018-10-13 DIAGNOSIS — A498 Other bacterial infections of unspecified site: Secondary | ICD-10-CM | POA: Diagnosis not present

## 2018-10-13 DIAGNOSIS — E039 Hypothyroidism, unspecified: Secondary | ICD-10-CM

## 2018-10-13 DIAGNOSIS — D469 Myelodysplastic syndrome, unspecified: Secondary | ICD-10-CM

## 2018-10-13 MED ORDER — LEVOTHYROXINE SODIUM 25 MCG PO TABS: ORAL_TABLET | ORAL | 2 refills | Status: DC

## 2018-10-13 MED ORDER — METOPROLOL SUCCINATE ER 25 MG PO TB24: 25.0000 mg | ORAL_TABLET | Freq: Every day | ORAL | 5 refills | Status: DC

## 2018-10-13 MED ORDER — SILDENAFIL CITRATE 20 MG PO TABS: 20.0000 mg | ORAL_TABLET | Freq: Three times a day (TID) | ORAL | 0 refills | Status: DC

## 2018-10-13 MED ORDER — LEVOTHYROXINE SODIUM 25 MCG PO TABS: 25.0000 ug | ORAL_TABLET | Freq: Every day | ORAL | 1 refills | Status: DC

## 2018-10-13 NOTE — Progress Notes (Signed)
Subjective:  Chief complaint follow-up for sacral decubitus ulcer which is improving ,  Patient ID: Patricia Mata, female    DOB: Nov 18, 1955, 63 y.o.   MRN: [id number]  HPI  Patricia Mata is a 63 y.o. female history severe aortic stenosis, myelodysplastic syndrome colon cancer status post hemicolectomy who had protracted hospital stay in which she had severe atrial fibrillation and hypotension all failure requiring CVVHD and multiple transfusions and a protracted stay in the ICU.  She developed a decubitus ulcer that has become a stage IV ulcer and has been at skilled nursing facility.  Sent to emergency department on November 25 due to worsening bleeding and blood clots found in her sacral decubitus ulcer.  She was seen by her wound care specialist Dr. Renard Hamper who try to cauterize this area but it continued to bleed.  She was admitted via the emergency department initially given antimicrobials in the form of vancomycin and metronidazole.  These were discontinued faction felt not to be present in the soft tissues.  Ultimately an MRI was performed which showed evidence of early coccygeal osteomyelitis.  Discontinue her antibiotics that she was on in the hospital and facilitated consultation with plastic surgery and Dr. Marla Roe performed I&D bone biopsy from the coccygeal region and sent this for culture and this grew Morganella morganii.  We have been treating her with Invanz for now 6 weeks.  The wound has been decreasing size with increasing granulation tissue.  She has reduced pain posteriorly and is being able to offload the area.  She is currently residing at skilled nursing facility:    [facility]  I had been encouraged by her symptoms and the appearance of the wound   Over when I checked her inflammatory markers that had worsened and therefore I extended her antibiotics with oral ciprofloxacin.  Talking to her last visit she did endorse the fact that she was having a gout  flare in her right ankle and indeed she was tender to palpation there.  She states that she did not have a flare of this during the l visit before then visit when she did have elevated inflammatory markers.  She does also have a myelodysplastic syndrome not sure if that may not be playing a role in these inflammatory markers being elevated.  Been off of ciprofloxacin from 6 February.  She has had no increase in pain in fact had decrease in pain at her site of her decubitus ulcer.  No fevers chills or other systemic symptoms  We checked her inflammatory markers and they had trended down when I last saw her.  She now returns to clinic for follow-up today.  She had blood work done with her primary care physician and her hemoglobin was not at the need for transfusion.  Her wound continues to improve and she does not complain of any pain there.    Past Medical History:  Diagnosis Date  . Cancer [facility]) 2008   Colon   . Diabetes mellitus without complication ([place])   . Gout 09/08/2018  . Hypertension   . Macrocytic anemia     Past Surgical History:  Procedure Laterality Date  . COLON SURGERY    . DEBRIDMENT OF DECUBITUS ULCER N/A 06/12/2018   Procedure: DEBRIDMENT OF DECUBITUS ULCER;  Surgeon: Wallace Going, DO;  Location: [facility];  Service: Plastics;  Laterality: N/A;  . IR FLUORO GUIDE PORT INSERTION RIGHT  04/02/2017  . IR US GUIDE VASC ACCESS RIGHT  04/02/2017  .  RIGHT/LEFT HEART CATH AND CORONARY ANGIOGRAPHY N/A 05/21/2018   Procedure: RIGHT/LEFT HEART CATH AND CORONARY ANGIOGRAPHY;  Surgeon: Nelva Bush, MD;  Location: [facility];  Service: Cardiovascular;  Laterality: N/A;    Family History  Problem Relation Age of Onset  . Hypertension Mother   . Heart attack Father   . Hypertension Sister   . Hypertension Brother   . Hypertension Sister   . Hypertension Sister   . Prostate cancer Brother   . HIV/AIDS Brother       Social History   Socioeconomic History   . Marital status: Married    Spouse name: Not on file  . Number of children: Not on file  . Years of education: Not on file  . Highest education level: Not on file  Occupational History  . Not on file  Social Needs  . Financial resource strain: Not on file  . Food insecurity:    Worry: Not on file    Inability: Not on file  . Transportation needs:    Medical: Not on file    Non-medical: Not on file  Tobacco Use  . Smoking status: Former Smoker    Types: Cigarettes    Last attempt to quit: 01/31/2001    Years since quitting: 17.7  . Smokeless tobacco: Former Network engineer and Sexual Activity  . Alcohol use: Yes    Alcohol/week: 2.0 standard drinks    Types: 2 Shots of liquor per week  . Drug use: Never  . Sexual activity: Not on file  Lifestyle  . Physical activity:    Days per week: Not on file    Minutes per session: Not on file  . Stress: Not on file  Relationships  . Social connections:    Talks on phone: Not on file    Gets together: Not on file    Attends religious service: Not on file    Active member of club or organization: Not on file    Attends meetings of clubs or organizations: Not on file    Relationship status: Not on file  Other Topics Concern  . Not on file  Social History Narrative  . Not on file    Allergies  Allergen Reactions  . Ancef [Cefazolin] Itching    Severe itching- after procedure, ancef was the antibiotic.-04/02/17  . Sulfa Antibiotics Rash and Other (See Comments)    Blisters, also     Current Outpatient Medications:  .  amiodarone (PACERONE) 200 MG tablet, Take 1 tablet (200 mg total) by mouth daily., Disp: 30 tablet, Rfl: 3 .  amLODipine (NORVASC) 5 MG tablet, Take 1 tablet (5 mg total) by mouth daily., Disp: 90 tablet, Rfl: 0 .  atorvastatin (LIPITOR) 40 MG tablet, Take 1 tablet (40 mg total) by mouth daily., Disp: 90 tablet, Rfl: 0 .  collagenase (SANTYL) ointment, Apply topically daily., Disp: 15 g, Rfl: 0 .  feeding  supplement, GLUCERNA SHAKE, (GLUCERNA SHAKE) LIQD, Take 237 mLs by mouth 3 (three) times daily between meals., Disp: 90 Can, Rfl: 0 .  furosemide (LASIX) 40 MG tablet, Take 1 tablet (40 mg total) by mouth daily., Disp: 30 tablet, Rfl: 5 .  glucose blood test strip, Use as instructed, Disp: 100 each, Rfl: 12 .  hydrALAZINE (APRESOLINE) 100 MG tablet, Take 1 tablet (100 mg total) by mouth 3 (three) times daily., Disp: 90 tablet, Rfl: 6 .  Insulin Glargine (BASAGLAR KWIKPEN) 100 UNIT/ML SOPN, Inject 0.14 mLs (14 Units total) into the  skin daily., Disp: 1 pen, Rfl: 1 .  insulin glargine (LANTUS) 100 unit/mL SOPN, Inject 0.14 mLs (14 Units total) into the skin daily after breakfast., Disp: 15 mL, Rfl: 11 .  Insulin Pen Needle 29G X 5MM MISC, Use as directed, Disp: 200 each, Rfl: 0 .  JANUMET XR 50-500 MG TB24, TAKE 1 TABLET BY MOUTH EVERY DAY WITH EVENING MEAL,SWALLOW WHOLE. DO NOT CRUSH, CHEW AND/OR DIVIDE, Disp: 30 tablet, Rfl: 0 .  latanoprost (XALATAN) 0.005 % ophthalmic solution, Place 1 drop into both eyes at bedtime. , Disp: , Rfl:  .  levothyroxine (SYNTHROID, LEVOTHROID) 25 MCG tablet, Take ONE tablet (25 mcg) by mouth Monday- Friday. Take TWO tablets (50 mcg) on Saturday and Sunday., Disp: 36 tablet, Rfl: 2 .  metoprolol succinate (TOPROL-XL) 25 MG 24 hr tablet, Take 1 tablet (25 mg total) by mouth daily., Disp: 30 tablet, Rfl: 5 .  Multiple Vitamins-Minerals (CENTRUM SILVER 50+WOMEN) TABS, Take 1 tablet by mouth daily., Disp: , Rfl:  .  mupirocin ointment (BACTROBAN) 2 %, Place 1 application into the nose daily., Disp: , Rfl:  .  ofloxacin (OCUFLOX) 0.3 % ophthalmic solution, Instill 1 drop TID in operative eye starting 2 days prior to surgery and after surgery for 3 weeks., Disp: , Rfl:  .  oxycodone-acetaminophen (PERCOCET) 2.5-325 MG tablet, Take 1 tablet by mouth every 8 (eight) hours as needed for pain., Disp: 15 tablet, Rfl: 0 .  pantoprazole (PROTONIX) 40 MG tablet, Take 1 tablet (40 mg  total) by mouth daily., Disp: 30 tablet, Rfl: 5 .  polyethylene glycol (MIRALAX / GLYCOLAX) packet, Take 17 g by mouth 2 (two) times daily., Disp: 14 each, Rfl: 0 .  prednisoLONE acetate (PRED FORTE) 1 % ophthalmic suspension, One drop in operative eye(s) TID starting 2 days before surgery and after surgery for 3 weeks, Disp: , Rfl:  .  senna (SENOKOT) 8.6 MG TABS tablet, Take 2 tablets (17.2 mg total) by mouth at bedtime., Disp: 120 each, Rfl: 0 .  sildenafil (REVATIO) 20 MG tablet, Take 1 tablet (20 mg total) by mouth 3 (three) times daily., Disp: 90 tablet, Rfl: 0 .  sodium hypochlorite (DAKIN'S 1/2 STRENGTH) external solution, Irrigate with 1 application as directed daily., Disp: , Rfl:  .  spironolactone (ALDACTONE) 25 MG tablet, Take 0.5 tablets (12.5 mg total) by mouth daily., Disp: 15 tablet, Rfl: 5 .  traMADol (ULTRAM) 50 MG tablet, Take 1 tablet (50 mg total) by mouth daily., Disp: 30 tablet, Rfl: 0 .  potassium chloride SA (K-DUR,KLOR-CON) 20 MEQ tablet, Take 1 tablet (20 mEq total) by mouth once for 1 dose., Disp: 60 tablet, Rfl: 5 No current facility-administered medications for this visit.   Facility-Administered Medications Ordered in Other Visits:  .  sodium chloride flush (NS) 0.9 % injection 10 mL, 10 mL, Intravenous, PRN, Alen Blew, Mathis Dad, MD   Review of Systems  Constitutional: Negative for activity change, appetite change, chills, diaphoresis, fatigue, fever and unexpected weight change.  HENT: Negative for congestion, dental problem, rhinorrhea, sinus pressure, sneezing, sore throat and trouble swallowing.   Eyes: Negative for photophobia and visual disturbance.  Respiratory: Negative for cough, chest tightness, shortness of breath, wheezing and stridor.   Cardiovascular: Negative for chest pain, palpitations and leg swelling.  Gastrointestinal: Negative for abdominal distention, abdominal pain, anal bleeding, blood in stool, constipation, diarrhea, nausea and vomiting.   Genitourinary: Negative for difficulty urinating, dysuria, flank pain and hematuria.  Musculoskeletal: Negative for arthralgias, gait problem, joint swelling  and myalgias.  Skin: Positive for wound. Negative for color change, pallor and rash.  Neurological: Negative for dizziness, tremors, weakness and light-headedness.  Hematological: Negative for adenopathy. Does not bruise/bleed easily.  Psychiatric/Behavioral: Negative for agitation, behavioral problems, confusion, decreased concentration, dysphoric mood and sleep disturbance.       Objective:   Physical Exam Constitutional:      General: She is not in acute distress.    Appearance: Normal appearance. She is well-developed. She is not ill-appearing or diaphoretic.  HENT:     Head: Normocephalic and atraumatic.     Right Ear: Hearing and external ear normal.     Left Ear: Hearing and external ear normal.     Nose: No nasal deformity or rhinorrhea.  Eyes:     General: No scleral icterus.    Conjunctiva/sclera: Conjunctivae normal.     Right eye: Right conjunctiva is not injected.     Left eye: Left conjunctiva is not injected.  Neck:     Musculoskeletal: Normal range of motion and neck supple.     Vascular: No JVD.  Cardiovascular:     Rate and Rhythm: Normal rate and regular rhythm.     Heart sounds: S1 normal and S2 normal.  Pulmonary:     Effort: Pulmonary effort is normal.     Breath sounds: No wheezing.  Abdominal:     General: There is no distension.  Musculoskeletal: Normal range of motion.     Right shoulder: Normal.     Left shoulder: Normal.     Right hip: Normal.     Left hip: Normal.     Right knee: Normal.     Left knee: Normal.  Lymphadenopathy:     Head:     Right side of head: No submandibular, preauricular or posterior auricular adenopathy.     Left side of head: No submandibular, preauricular or posterior auricular adenopathy.     Cervical:     Right cervical: No superficial or deep cervical  adenopathy.    Left cervical: No superficial or deep cervical adenopathy.  Skin:    General: Skin is warm and dry.     Coloration: Skin is not pale.     Findings: No abrasion, bruising, ecchymosis, erythema, lesion or rash.     Nails: There is no clubbing.   Neurological:     General: No focal deficit present.     Mental Status: She is alert and oriented to person, place, and time.     Sensory: No sensory deficit.     Coordination: Coordination normal.     Gait: Gait normal.  Psychiatric:        Attention and Perception: She is attentive.        Mood and Affect: Mood normal.        Speech: Speech normal.        Behavior: Behavior normal. Behavior is cooperative.        Thought Content: Thought content normal.        Judgment: Judgment normal.   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  Plan on seeing her back in late July.    Myelodysplastic syndrome: Wonder if this may potentially be a confounder   Risk for novel coronavirus 2019: I have asked her to shelter in place

## 2018-10-13 NOTE — Telephone Encounter (Signed)
error 

## 2018-10-13 NOTE — Progress Notes (Signed)
She is to take her current dose levothyroxine once daily M-F then take two tabs by mouth on Sat and Sun

## 2018-10-14 LAB — C-REACTIVE PROTEIN: CRP: 2 mg/L (ref <8.0)

## 2018-10-14 LAB — SEDIMENTATION RATE: Sed Rate: 75 mm/h — ABNORMAL HIGH (ref 0–30)

## 2018-10-19 ENCOUNTER — Other Ambulatory Visit: Payer: Self-pay

## 2018-10-19 ENCOUNTER — Telehealth: Payer: Self-pay

## 2018-10-19 MED ORDER — GLUCOSE BLOOD VI STRP: ORAL_STRIP | 12 refills | Status: DC

## 2018-10-19 NOTE — Telephone Encounter (Signed)
Called to refill accu check strips

## 2018-10-20 ENCOUNTER — Other Ambulatory Visit: Payer: Self-pay

## 2018-10-20 MED ORDER — BLOOD GLUCOSE METER KIT: PACK | 3 refills | Status: DC

## 2018-10-25 ENCOUNTER — Other Ambulatory Visit: Payer: Self-pay | Admitting: Nurse Practitioner

## 2018-10-25 DIAGNOSIS — E1165 Type 2 diabetes mellitus with hyperglycemia: Secondary | ICD-10-CM

## 2018-11-02 ENCOUNTER — Encounter (HOSPITAL_COMMUNITY): Payer: Self-pay

## 2018-11-02 ENCOUNTER — Ambulatory Visit (HOSPITAL_COMMUNITY)
Admission: RE | Admit: 2018-11-02 | Discharge: 2018-11-02 | Disposition: A | Discharge status: Home / Self Care | Payer: BC Managed Care – PPO | Source: Ambulatory Visit | Attending: Internal Medicine | Admitting: Internal Medicine

## 2018-11-02 ENCOUNTER — Other Ambulatory Visit: Payer: Self-pay

## 2018-11-02 VITALS — BP 120/70 | HR 53 | Wt 171.0 lb

## 2018-11-02 DIAGNOSIS — I5032 Chronic diastolic (congestive) heart failure: Secondary | ICD-10-CM

## 2018-11-02 DIAGNOSIS — N179 Acute kidney failure, unspecified: Secondary | ICD-10-CM

## 2018-11-02 DIAGNOSIS — I2721 Secondary pulmonary arterial hypertension: Secondary | ICD-10-CM | POA: Diagnosis not present

## 2018-11-02 DIAGNOSIS — E7800 Pure hypercholesterolemia, unspecified: Secondary | ICD-10-CM

## 2018-11-02 DIAGNOSIS — I10 Essential (primary) hypertension: Secondary | ICD-10-CM

## 2018-11-02 DIAGNOSIS — E039 Hypothyroidism, unspecified: Secondary | ICD-10-CM

## 2018-11-02 DIAGNOSIS — I480 Paroxysmal atrial fibrillation: Secondary | ICD-10-CM

## 2018-11-02 MED ORDER — AMIODARONE HCL 200 MG PO TABS: 200.0000 mg | ORAL_TABLET | Freq: Every day | ORAL | 3 refills | Status: DC

## 2018-11-02 MED ORDER — ATORVASTATIN CALCIUM 40 MG PO TABS: 40.0000 mg | ORAL_TABLET | Freq: Every day | ORAL | 0 refills | Status: DC

## 2018-11-02 MED ORDER — AMLODIPINE BESYLATE 5 MG PO TABS: 5.0000 mg | ORAL_TABLET | Freq: Every day | ORAL | 0 refills | Status: DC

## 2018-11-02 MED ORDER — SPIRONOLACTONE 25 MG PO TABS: 12.5000 mg | ORAL_TABLET | Freq: Every day | ORAL | 5 refills | Status: DC

## 2018-11-02 MED ORDER — HYDRALAZINE HCL 100 MG PO TABS: 100.0000 mg | ORAL_TABLET | Freq: Three times a day (TID) | ORAL | 6 refills | Status: DC

## 2018-11-02 MED ORDER — FUROSEMIDE 40 MG PO TABS: 40.0000 mg | ORAL_TABLET | Freq: Every day | ORAL | 5 refills | Status: DC

## 2018-11-02 MED ORDER — SILDENAFIL CITRATE 20 MG PO TABS: 20.0000 mg | ORAL_TABLET | Freq: Three times a day (TID) | ORAL | 0 refills | Status: DC

## 2018-11-02 MED ORDER — METOPROLOL SUCCINATE ER 25 MG PO TB24: 25.0000 mg | ORAL_TABLET | Freq: Every day | ORAL | 5 refills | Status: DC

## 2018-11-02 NOTE — Progress Notes (Signed)
Mata Failure TeleHealth Note  Due to national recommendations of social distancing due to COVID 19, Audio/video telehealth visit is felt to be most appropriate for this patient at this time.  See MyChart message from today for patient consent regarding telehealth for Patricia Mata.  Date:  11/02/2018   ID:  Gertis Sory, DOB June 08, 1956, MRN [id number]  Location: Home  Provider location: [place] [facility] Type of Visit: Established patient   PCP:  Arnette Felts, FNP  Cardiologist:  Nanetta Batty, MD Primary HF: Dr Gala Romney   Chief Complaint: Diasotlic Mata Failure   History of Present Illness: Patricia Mata is a 63 y.o. 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  Admitted on 10/27 with AF with RVR and recurrent anemia. Developed respiratory failure and intubated. She was transfused and placed on IV amio. Subsequently developed AKI/ESRD and placed on CVVHD. Converted to NSR on amio and weaned offthe vent. Renal function now back to normal and off CVVHD. Transitioned to PO amiodarone after appropriate time. She was not put on [facility] due to chronic anemia requiring transfusions. Echoshowed normal LVEF 65-70% with moderate AS and moderate MS and severe PH with RVSP .She is not a candidate for intervention of AS or MS with advanced PAH.  Underwent [facility] 05/21/2018  that showed normal coronaries and severely elevated PA pressures. Full results below.[facility] team was consulted for further management of PAH. V/Q scan was negative. She was started on sildenafil and tolerated well. She diuresed with IV lasix and transitioned to lasix 40 mg PO BID. She did not require O2 at discharge. She required a total of 9 units of pRBCs while admitted. She requires transfusions every 2-3  weeks as an outpatient. Shealsodeveloped a buttock wound. WOC was consulted andrecommended hydrotherapy.Palliative care was consulted and decision was made to transition to DNR.Yellow DNRform signedfor DC. PT and OT recommended SNF at discharge. She will have palliative at SNF with possible transition to hospice eventually.  Pt admitted 11/25 - 06/15/18  with symptomatic anemia requiring transfusion. (in the setting of MDS requiring transfusions every 2-3 weeks chronically). 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 ID followed and started on vanc/meropenem. Switched to ertapenem via PICC line to continue until 07/24/18.   She presents via [employer] educational needs teacher for a telehealth visit today.  Overall feeling fine. Denies PND/Orthopnea. Mild dyspnea with exertion but says she is improving with HH. No bleeding issues. Denies syncope/presyncope. Appetite ok. No fever or chills. Weight at home 171-178  pounds. Taking all medications. All medications are in a pill box.  She has a HHRN and HHPTfrom [facility]. She requires weekly wound care. Also [facility].  Lives with her husband.   she denies symptoms worrisome for COVID 19.   Past Medical History:  Diagnosis Date  . Cancer [facility]) 2008   Colon   . Diabetes mellitus without complication (HCC)   . Gout 09/08/2018  . Hypertension   . Macrocytic anemia    Past Surgical History:  Procedure Laterality Date  . COLON SURGERY    . DEBRIDMENT OF DECUBITUS ULCER N/A 06/12/2018   Procedure: DEBRIDMENT OF DECUBITUS ULCER;  Surgeon: Peggye Form, DO;  Location: [facility] ORS;  Service: Plastics;  Laterality: N/A;  . IR FLUORO GUIDE PORT  INSERTION RIGHT  04/02/2017  . IR US GUIDE VASC ACCESS RIGHT  04/02/2017  . RIGHT/LEFT Mata CATH AND CORONARY ANGIOGRAPHY N/A 05/21/2018   Procedure: RIGHT/LEFT Mata CATH AND CORONARY ANGIOGRAPHY;  Surgeon: Yvonne Kendall, MD;  Location: [facility] INVASIVE CV LAB;   Service: Cardiovascular;  Laterality: N/A;     Current Outpatient Medications  Medication Sig Dispense Refill  . amiodarone (PACERONE) 200 MG tablet Take 1 tablet (200 mg total) by mouth daily. 30 tablet 3  . amLODipine (NORVASC) 5 MG tablet Take 1 tablet (5 mg total) by mouth daily. 90 tablet 0  . atorvastatin (LIPITOR) 40 MG tablet Take 1 tablet (40 mg total) by mouth daily. 90 tablet 0  . blood glucose meter kit and supplies Dispense based on patient and insurance preference. Use up to four times daily as directed. (FOR ICD-10 E10.9, E11.9). 1 each 3  . collagenase (SANTYL) ointment Apply topically daily. 15 g 0  . feeding supplement, GLUCERNA SHAKE, (GLUCERNA SHAKE) LIQD Take 237 mLs by mouth 3 (three) times daily between meals. 90 Can 0  . furosemide (LASIX) 40 MG tablet Take 1 tablet (40 mg total) by mouth daily. 30 tablet 5  . glucose blood test strip Use as instructed 100 each 12  . glucose blood test strip Use as instructed 100 each 12  . hydrALAZINE (APRESOLINE) 100 MG tablet Take 1 tablet (100 mg total) by mouth 3 (three) times daily. 90 tablet 6  . Insulin Glargine (BASAGLAR KWIKPEN) 100 UNIT/ML SOPN INJECT 14 UNITS TOTAL INTO THE SKIN DAILY. 3 pen 1  . insulin glargine (LANTUS) 100 unit/mL SOPN Inject 0.14 mLs (14 Units total) into the skin daily after breakfast. 15 mL 11  . Insulin Pen Needle 29G X MISC Use as directed 200 each 0  . JANUMET XR 50-500 MG TB24 TAKE 1 TABLET BY MOUTH EVERY DAY WITH EVENING MEAL,SWALLOW WHOLE. DO NOT CRUSH, CHEW AND/OR DIVIDE 30 tablet 0  . latanoprost (XALATAN) 0.005 % ophthalmic solution Place 1 drop into both eyes at bedtime.     Marland Kitchen levothyroxine (SYNTHROID, LEVOTHROID) 25 MCG tablet Take ONE tablet (25 mcg) by mouth Monday- Friday. Take TWO tablets (50 mcg) on Saturday and Sunday. 36 tablet 2  . metoprolol succinate (TOPROL-XL) 25 MG 24 hr tablet Take 1 tablet (25 mg total) by mouth daily. 30 tablet 5  . Multiple Vitamins-Minerals (CENTRUM  SILVER 50+WOMEN) TABS Take 1 tablet by mouth daily.    . mupirocin ointment (BACTROBAN) 2 % Place 1 application into the nose daily.    Marland Kitchen ofloxacin (OCUFLOX) 0.3 % ophthalmic solution Instill 1 drop TID in operative eye starting 2 days prior to surgery and after surgery for 3 weeks.    Marland Kitchen oxycodone-acetaminophen (PERCOCET) 2.5-325 MG tablet Take 1 tablet by mouth every 8 (eight) hours as needed for pain. 15 tablet 0  . pantoprazole (PROTONIX) 40 MG tablet Take 1 tablet (40 mg total) by mouth daily. 30 tablet 5  . polyethylene glycol (MIRALAX / GLYCOLAX) packet Take 17 g by mouth 2 (two) times daily. 14 each 0  . prednisoLONE acetate (PRED FORTE) 1 % ophthalmic suspension One drop in operative eye(s) TID starting 2 days before surgery and after surgery for 3 weeks    . senna (SENOKOT) 8.6 MG TABS tablet Take 2 tablets (17.2 mg total) by mouth at bedtime. 120 each 0  . sildenafil (REVATIO) 20 MG tablet Take 1 tablet (20 mg total) by mouth 3 (three) times daily. 90 tablet 0  .  sodium hypochlorite (DAKIN'S 1/2 STRENGTH) external solution Irrigate with 1 application as directed daily.    Marland Kitchen spironolactone (ALDACTONE) 25 MG tablet Take 0.5 tablets (12.5 mg total) by mouth daily. 15 tablet 5  . traMADol (ULTRAM) 50 MG tablet Take 1 tablet (50 mg total) by mouth daily. 30 tablet 0  . potassium chloride SA (K-DUR,KLOR-CON) 20 MEQ tablet Take 1 tablet (20 mEq total) by mouth once for 1 dose. 60 tablet 5   No current facility-administered medications for this encounter.    Facility-Administered Medications Ordered in Other Encounters  Medication Dose Route Frequency Provider Last Rate Last Dose  . sodium chloride flush (NS) 0.9 % injection 10 mL  10 mL Intravenous PRN Benjiman Core, MD        Allergies:   Ancef [cefazolin] and Sulfa antibiotics   Social History:  The patient  reports that she quit smoking about 17 years ago. Her smoking use included cigarettes. She has quit using smokeless tobacco. She  reports current alcohol use of about 2.0 standard drinks of alcohol per week. She reports that she does not use drugs.   Family History:  The patient's family history includes HIV/AIDS in her brother; Mata attack in her father; Hypertension in her brother, mother, sister, sister, and sister; Prostate cancer in her brother.   ROS:  Please see the history of present illness.   All other systems are personally reviewed and negative.   Exam:  Tele Health Call; Exam is subjective  General:  Speaks in full sentences. No resp difficulty. Lungs: Normal respiratory effort with conversation.  Abdomen: Non-distended per patient report Extremities: Pt denies edema. Neuro: Alert & oriented x 3.   Recent Labs: 05/13/2018: B Natriuretic Peptide 3,571.3 06/14/2018: Magnesium 2.0 10/09/2018: TSH 48.590 10/12/2018: ALT 27; BUN 84; Creatinine 3.08; Hemoglobin 7.2; Platelet Count 186; Potassium 4.2; Sodium 138  Personally reviewed   Wt Readings from Last 3 Encounters:  11/02/18 77.6 kg (171 lb)  10/13/18 80.7 kg (178 lb)  10/12/18 81.1 kg (178 lb 11.2 oz)      ASSESSMENT AND PLAN:  1. PAH - Echo 05/12/18 LVEF 65-70%, Grade 2 DD, Mod AS, Mild/Mod MS, Mod TR, Trivial PI, PA peak pressure 93.  - L/RHC 05/21/18 with no significant CAD, severe pulmonary HTN with PAP 62 mmHg, PVR 5.4 WU.  - Severe, likely longstanding with NYHA III-IIIb symptoms.  - Suspect multifactorial in nature suspect mostly WHO group II (diastolic HF and valvular disease) and III (OSA) but given degree of PH cannot exclude WHO Group I component. PVR 5.4 WU - - VQ negative NYHA II-III. Functional improvement.  - Continue sildenafil 20 mg TID. Plan to refill today     - Continue lasix 40 mg daily.   2. Moderate AS/MS - She is not candidate for intervention with advanced PAH.No change.   3. PAF -  Did not tolerate AF well at all. - Continue amiodarone 200 mg daily.  - Continue Toprol XL 25 mg daily. Bradycardia would not  increase.  - Would try and avoid anticoagulation with transfusion dependent anemia. (chronic).  - CHA2DS2/VASc is at least 4.   4. AKI -Cr 1.6 07/31/2018.  -Check BMET next visit.   5. Transfusion dependent anemia in setting of Myelodysplastic syndrome -Transfuse as needed per [facility].   6. HTN - BP at home with [facility] has been stable. Continue current regimen.   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 [facility].  8. Bradycardia Pulse at home in the 50s   She has requested removal DNR. She has torn up the GOLD DNR form.   COVID screen The patient does not have any symptoms that suggest any further testing/ screening at this time.  Social distancing reinforced today.  Patient Risk: After full review of this patients clinical status, I feel that they are at moderate risk for cardiac decompensation at this time.  Relevant cardiac medications were reviewed at length with the patient today. The patient does not have concerns regarding their medications at this time.   The following changes were made today:  See above  Recommended follow-up:  Follow up in 3 months with Dr Gala Romney with an ECHO.   Today, I have spent 25  minutes with the patient with telehealth technology discussing the above issues .    Patricia Martins, NP  11/02/2018 11:25 AM  [facility] [address] [facility] [place] [address] [phone] (office) [phone] (fax)

## 2018-11-02 NOTE — Patient Instructions (Addendum)
We refilled all cardiac for 6 months and send to [facility] on [address].    Your physician has requested that you have an echocardiogram. Echocardiography is a painless test that uses sound waves to create images of your heart. It provides your doctor with information about the size and shape of your heart and how well your heart's chambers and valves are working. This procedure takes approximately one hour. There are no restrictions for this procedure. This will be at your follow up appointment with Dr. Haroldine Laws.  Please follow up with Dr. Haroldine Laws in 3 months with an echocardiogram. Someone will be calling you in order to schedule this appointment.

## 2018-11-02 NOTE — Addendum Note (Signed)
Encounter addended by: Marlise Eves, RN on: 11/02/2018 11:59 AM  Actions taken: Order list changed, Diagnosis association updated, Clinical Note Signed

## 2018-11-02 NOTE — Progress Notes (Signed)
Called pt to review avs. No answer. Left voicemail. Refilled all cardiac meds. avs to be mailed.

## 2018-11-04 ENCOUNTER — Other Ambulatory Visit: Payer: Self-pay

## 2018-11-04 MED ORDER — FUROSEMIDE 40 MG PO TABS: 40.0000 mg | ORAL_TABLET | Freq: Every day | ORAL | 5 refills | Status: DC

## 2018-11-04 MED ORDER — BLOOD GLUCOSE METER KIT: PACK | 3 refills | Status: AC

## 2018-11-04 MED ORDER — SPIRONOLACTONE 25 MG PO TABS: 12.5000 mg | ORAL_TABLET | Freq: Every day | ORAL | 5 refills | Status: DC

## 2018-11-19 ENCOUNTER — Telehealth: Payer: Self-pay | Admitting: Cardiovascular Disease

## 2018-11-19 MED ORDER — HYDRALAZINE HCL 100 MG PO TABS: 100.0000 mg | ORAL_TABLET | Freq: Three times a day (TID) | ORAL | 6 refills | Status: DC

## 2018-11-19 MED ORDER — HYDRALAZINE HCL 100 MG PO TABS: 100.0000 mg | ORAL_TABLET | Freq: Three times a day (TID) | ORAL | 5 refills | Status: DC

## 2018-11-19 NOTE — Telephone Encounter (Signed)
Patient called stating her pharmacy called her saying the insurance company is saying does she really need to have her medications.

## 2018-11-19 NOTE — Telephone Encounter (Signed)
Pt states that when she spoke with [facility] [address] [facility] today, she was told by pharmacy personnel that her sildenafil and hydralazine had not been refilled b/c her insurance company questioned if pt needed to be on the two meds. Pt almost out of both meds. Reviewed virtual visit notes from 4/20. Pt was to continue sildenafil 20 mg TID for PAH and continue HTN med regimen. It appears cardiac meds were refilled during 4/20 virtual visit.   Contacted pt pharmacy. Per personnel, prior auth needed for sildenafil because billing for med was sent to insurance, but they will bill her medicare and have med ready for pt. Pharmacy personnel states that no refill for hydralazine in their system. Sent hydralazine refill Rx to pharmacy and she states both sildenafil and hydralazine will be refilled.  Contacted pt again to make aware that meds should be available for pickup today and may contact pharmacy for updates. Pt verbalized understanding

## 2018-11-20 ENCOUNTER — Telehealth: Payer: Self-pay | Admitting: Cardiovascular Disease

## 2018-11-20 ENCOUNTER — Other Ambulatory Visit: Payer: Self-pay

## 2018-11-20 ENCOUNTER — Telehealth: Payer: Self-pay

## 2018-11-20 MED ORDER — HYDRALAZINE HCL 100 MG PO TABS: 100.0000 mg | ORAL_TABLET | Freq: Three times a day (TID) | ORAL | 5 refills | Status: DC

## 2018-11-20 NOTE — Telephone Encounter (Signed)
  Patient is calling stating that pharmacy states they do not have a new script for her hydralazine. Please send prescription to pharmacy. Patient is out of medication at this time

## 2018-11-20 NOTE — Telephone Encounter (Signed)
Contacted pt who confirmed that she was able to pick up refill of hydralazine

## 2018-11-20 NOTE — Telephone Encounter (Signed)
Attempted return call to [facility] at number provided to inform that hydralazine was refilled on 5/7 but will contact pt to resolve. LMTCB if needed. Completed another refill for hydralazine via fax and one via e-prescribe contacted [facility] on [address]. Personnel stated they did not see e-prescribed order and have not received fax yet. Will call again for status update.  Spoke with pt to make aware that troubleshooting issue with refill. Pt verbalized understanding

## 2018-11-20 NOTE — Telephone Encounter (Signed)
New Message           . [name] FNP office is calling to verify if Dr. Gwenlyn Found is filling the patient's  Hydrodralazine 100 mg or not patient is out of medication and she is calling her primary for the medication.  Who should be filling the medication pls call [phone] Ext [phone] (Yamilka)Please call soon due to the office closing early.

## 2018-11-20 NOTE — Telephone Encounter (Signed)
Patient called stating her hydralazine has not been sent to the pharmacy. I called her cardiologist office to see what is going on if we are the ones who are supposed to be filling it or if it is [place]. Patient was in the hopsital for a long time due to her heart condition and we are under the impression that they are the ones who are supposed to be filling it. I am waiting on a call back. YRL,RMA

## 2018-11-24 ENCOUNTER — Telehealth: Payer: Self-pay

## 2018-11-24 NOTE — Telephone Encounter (Signed)
Note    Patient called stating her hydralazine has not been sent to the pharmacy. I called her cardiologist office to see what is going on if we are the ones who are supposed to be filling it or if it is [place]. Patient was in the hopsital for a long time due to her heart condition and we are under the impression that they are the ones who are supposed to be filling it. I am waiting on a call back. YRL,RMA       TINA CALLED FROM DR.BERRY'S OFFICE RETURNED MY CALL ABOUT THE HYDRALAZINE AND THE REFILL WAS SENT ON 05/07 AND SHE GOT IN TOUCH WITH THE PT TO TROUBLESHOOT AND SEE WHAT IS GOING ON. Lonia Mad

## 2018-11-25 ENCOUNTER — Other Ambulatory Visit: Payer: Self-pay | Admitting: Nurse Practitioner

## 2018-11-25 DIAGNOSIS — E1165 Type 2 diabetes mellitus with hyperglycemia: Secondary | ICD-10-CM

## 2018-11-28 ENCOUNTER — Other Ambulatory Visit: Payer: Self-pay | Admitting: Nurse Practitioner

## 2018-11-28 DIAGNOSIS — E7800 Pure hypercholesterolemia, unspecified: Secondary | ICD-10-CM

## 2018-11-30 ENCOUNTER — Other Ambulatory Visit: Payer: Self-pay | Admitting: Nurse Practitioner

## 2018-11-30 ENCOUNTER — Telehealth: Payer: Self-pay

## 2018-11-30 NOTE — Telephone Encounter (Signed)
Patient called requesting a refill on colchicine I notified pt that the Rx has been sent to the pharmacy. YRL,RMA

## 2018-12-02 ENCOUNTER — Other Ambulatory Visit: Payer: Self-pay | Admitting: Nurse Practitioner

## 2018-12-02 ENCOUNTER — Other Ambulatory Visit (HOSPITAL_COMMUNITY): Payer: Self-pay | Admitting: Student

## 2018-12-02 DIAGNOSIS — E039 Hypothyroidism, unspecified: Secondary | ICD-10-CM

## 2018-12-03 ENCOUNTER — Telehealth: Payer: Self-pay | Admitting: Oncology

## 2018-12-03 NOTE — Telephone Encounter (Signed)
Added lab/fu for 5/26 per 5/21 schedule message. Date/time/patient aware per message.

## 2018-12-08 ENCOUNTER — Inpatient Hospital Stay: Payer: BC Managed Care – PPO

## 2018-12-08 ENCOUNTER — Inpatient Hospital Stay: Payer: BC Managed Care – PPO | Attending: Oncology | Admitting: Oncology

## 2018-12-08 ENCOUNTER — Other Ambulatory Visit: Payer: Self-pay

## 2018-12-08 ENCOUNTER — Telehealth: Payer: Self-pay

## 2018-12-08 VITALS — BP 161/49 | HR 51 | Temp 97.8°F | Resp 18 | Ht 64.8 in | Wt 184.7 lb

## 2018-12-08 DIAGNOSIS — N183 Chronic kidney disease, stage 3 (moderate): Secondary | ICD-10-CM | POA: Diagnosis not present

## 2018-12-08 DIAGNOSIS — D631 Anemia in chronic kidney disease: Secondary | ICD-10-CM

## 2018-12-08 DIAGNOSIS — D469 Myelodysplastic syndrome, unspecified: Secondary | ICD-10-CM | POA: Insufficient documentation

## 2018-12-08 DIAGNOSIS — Z79899 Other long term (current) drug therapy: Secondary | ICD-10-CM | POA: Insufficient documentation

## 2018-12-08 DIAGNOSIS — N189 Chronic kidney disease, unspecified: Secondary | ICD-10-CM

## 2018-12-08 DIAGNOSIS — I10 Essential (primary) hypertension: Secondary | ICD-10-CM | POA: Insufficient documentation

## 2018-12-08 DIAGNOSIS — I129 Hypertensive chronic kidney disease with stage 1 through stage 4 chronic kidney disease, or unspecified chronic kidney disease: Secondary | ICD-10-CM | POA: Diagnosis not present

## 2018-12-08 LAB — CMP (CANCER CENTER ONLY)
ALT: 11 U/L (ref 0–44)
AST: 20 U/L (ref 15–41)
Albumin: 3.9 g/dL (ref 3.5–5.0)
Alkaline Phosphatase: 73 U/L (ref 38–126)
Anion gap: 11 (ref 5–15)
BUN: 66 mg/dL — ABNORMAL HIGH (ref 8–23)
CO2: 24 mmol/L (ref 22–32)
Calcium: 9.5 mg/dL (ref 8.9–10.3)
Chloride: 101 mmol/L (ref 98–111)
Creatinine: 2.52 mg/dL — ABNORMAL HIGH (ref 0.44–1.00)
GFR, Est AFR Am: 23 mL/min — ABNORMAL LOW (ref >60)
GFR, Estimated: 19 mL/min — ABNORMAL LOW (ref >60)
Glucose, Bld: 189 mg/dL — ABNORMAL HIGH (ref 70–99)
Potassium: 4.4 mmol/L (ref 3.5–5.1)
Sodium: 136 mmol/L (ref 135–145)
Total Bilirubin: 0.6 mg/dL (ref 0.3–1.2)
Total Protein: 7.6 g/dL (ref 6.5–8.1)

## 2018-12-08 LAB — CBC WITH DIFFERENTIAL (CANCER CENTER ONLY)
Abs Immature Granulocytes: 0.03 10*3/uL (ref 0.00–0.07)
Basophils Absolute: 0 10*3/uL (ref 0.0–0.1)
Basophils Relative: 0 %
Eosinophils Absolute: 0.1 10*3/uL (ref 0.0–0.5)
Eosinophils Relative: 1 %
HCT: 20.7 % — ABNORMAL LOW (ref 36.0–46.0)
Hemoglobin: 6.6 g/dL — CL (ref 12.0–15.0)
Immature Granulocytes: 1 %
Lymphocytes Relative: 13 %
Lymphs Abs: 0.7 10*3/uL (ref 0.7–4.0)
MCH: 31.6 pg (ref 26.0–34.0)
MCHC: 31.9 g/dL (ref 30.0–36.0)
MCV: 99 fL (ref 80.0–100.0)
Monocytes Absolute: 0.9 10*3/uL (ref 0.1–1.0)
Monocytes Relative: 16 %
Neutro Abs: 3.8 10*3/uL (ref 1.7–7.7)
Neutrophils Relative %: 69 %
Platelet Count: 168 10*3/uL (ref 150–400)
RBC: 2.09 MIL/uL — ABNORMAL LOW (ref 3.87–5.11)
RDW: 23.2 % — ABNORMAL HIGH (ref 11.5–15.5)
WBC Count: 5.5 10*3/uL (ref 4.0–10.5)
nRBC: 0.4 % — ABNORMAL HIGH (ref 0.0–0.2)

## 2018-12-08 LAB — SAMPLE TO BLOOD BANK

## 2018-12-08 NOTE — Telephone Encounter (Signed)
Dr. Alen Blew made aware of hemoglobin 6.6- Per Dr. Alen Blew patient to receive blood transfusion today or Friday. Patient scheduled to receive transfusion Friday 12/11/18 at 10:00 am. Patient aware of appointment date and time and verbalized understanding. Released type and screen and prepare per [facility] in blood bank.

## 2018-12-08 NOTE — Progress Notes (Signed)
Hematology and Oncology Follow Up Visit  Patricia Mata [id number] [birth year] 63 y.o. 12/08/2018 1:12 PM   Principle Diagnosis: 63 year old woman with:   1.  Colon cancer diagnosed in 2008.  She was found to have stage III disease and has been in remission since that time.  2.  Myelodysplastic syndrome (MDS) diagnosed in 2017.  She was found to have IPSS-R score was 2.5  in 2017.    Prior Therapy: She is S/P hemicolectomy done in 11/2006. 1/13 lymph nodes are positive.  This was followed by 12 cycles of FOLFOX completed in 05/2007  Aranesp 300 mcg every 2 to 3 weeks therapy was ineffective to eliminate transfusion needs.  5-azacytidine at 75 mg/m square subcutaneously started in October 2018.  She is status post 7 cycles of therapy completed in April 2019.   Current therapy: Supportive care with packed red cell transfusion as needed.    Interim History:  Patricia Mata is here for a repeat evaluation.  Since the last visit, she continues to do well without really any symptoms.  She remains active and continues to attend activities of daily living.  Her mobility has improved and her sacral wound has reasonably healed.  She continues to ambulate without any recent falls or syncope.  She has not been able to drive as of yet.   She denied any alteration mental status, neuropathy, confusion or dizziness.  Denies any headaches or lethargy.  Denies any night sweats, weight loss or changes in appetite.  Denied orthopnea, dyspnea on exertion or chest discomfort.  Denies shortness of breath, difficulty breathing hemoptysis or cough.  Denies any abdominal distention, nausea, early satiety or dyspepsia.  Denies any hematuria, frequency, dysuria or nocturia.  Denies any skin irritation, dryness or rash.  Denies any ecchymosis or petechiae.  Denies any lymphadenopathy or clotting.  Denies any heat or cold intolerance.  Denies any anxiety or depression.  Remaining review of system is  negative.     Medications: I have reviewed the patient's current medications.   Allergies:  Allergies  Allergen Reactions  . Ancef [Cefazolin] Itching    Severe itching- after procedure, ancef was the antibiotic.-04/02/17  . Sulfa Antibiotics Rash and Other (See Comments)    Blisters, also     Physical Exam:    Blood pressure (!) 161/49, pulse (!) 51, temperature 97.8 F (36.6 C), temperature source Oral, resp. rate 18, height 5' 4.8" (1.646 m), weight 184 lb 11.2 oz (83.8 kg), SpO2 100 %.       ECOG: 2    General appearance: Comfortable appearing without any discomfort Head: Normocephalic without any trauma Oropharynx: Mucous membranes are moist and pink without any thrush or ulcers. Eyes: Pupils are equal and round reactive to light. Lymph nodes: No cervical, supraclavicular, inguinal or axillary lymphadenopathy.   Heart:regular rate and rhythm.  S1 and S2 without leg edema. Lung: Clear without any rhonchi or wheezes.  No dullness to percussion. Abdomin: Soft, nontender, nondistended with good bowel sounds.  No hepatosplenomegaly. Musculoskeletal: No joint deformity or effusion.  Full range of motion noted. Neurological: No deficits noted on motor, sensory and deep tendon reflex exam. Skin: No petechial rash or dryness.  Appeared moist.             Lab Results: Lab Results  Component Value Date   WBC 6.1 10/12/2018   HGB 7.2 (L) 10/12/2018   HCT 22.7 (L) 10/12/2018   MCV 93.4 10/12/2018   PLT 186 10/12/2018  Chemistry      Component Value Date/Time   NA 138 10/12/2018 0923   NA 141 09/09/2018 1230   NA 134 (L) 05/26/2017 0949   K 4.2 10/12/2018 0923   K 4.0 05/26/2017 0949   CL 102 10/12/2018 0923   CL 96 (L) 03/31/2012 1513   CO2 24 10/12/2018 0923   CO2 24 05/26/2017 0949   BUN 84 (H) 10/12/2018 0923   BUN 68 (H) 09/09/2018 1230   BUN 29.2 (H) 05/26/2017 0949   CREATININE 3.08 (HH) 10/12/2018 0923   CREATININE 2.48 (H) 09/08/2018  1315   CREATININE 1.4 (H) 05/26/2017 0949      Component Value Date/Time   CALCIUM 10.1 10/12/2018 0923   CALCIUM 8.9 05/26/2017 0949   ALKPHOS 88 10/12/2018 0923   ALKPHOS 157 (H) 05/26/2017 0949   AST 37 10/12/2018 0923   AST 29 05/26/2017 0949   ALT 27 10/12/2018 0923   ALT 23 05/26/2017 0949   BILITOT 0.9 10/12/2018 0923   BILITOT 2.19 (H) 05/26/2017 0949     Current Outpatient Medications on File Prior to Visit  Medication Sig Dispense Refill  . amiodarone (PACERONE) 200 MG tablet TAKE 1 TABLET BY MOUTH EVERY DAY 90 tablet 1  . amLODipine (NORVASC) 5 MG tablet Take 1 tablet (5 mg total) by mouth daily. 90 tablet 0  . atorvastatin (LIPITOR) 40 MG tablet TAKE 1 TABLET BY MOUTH EVERY DAY 90 tablet 0  . blood glucose meter kit and supplies Dispense based on patient and insurance preference. Use up to four times daily as directed. (FOR ICD-10 E10.9, E11.9). 1 each 3  . colchicine 0.6 MG tablet TAKE 1 TABLET BY MOUTH TWICE A DAY AS NEEDED FOR GOUT 60 tablet 1  . collagenase (SANTYL) ointment Apply topically daily. 15 g 0  . feeding supplement, GLUCERNA SHAKE, (GLUCERNA SHAKE) LIQD Take 237 mLs by mouth 3 (three) times daily between meals. 90 Can 0  . furosemide (LASIX) 40 MG tablet Take 1 tablet (40 mg total) by mouth daily. 30 tablet 5  . glucose blood test strip Use as instructed 100 each 12  . glucose blood test strip Use as instructed 100 each 12  . hydrALAZINE (APRESOLINE) 100 MG tablet Take 1 tablet (100 mg total) by mouth 3 (three) times daily. 180 tablet 5  . hydrALAZINE (APRESOLINE) 100 MG tablet Take 1 tablet (100 mg total) by mouth 3 (three) times daily. 180 tablet 5  . Insulin Glargine (BASAGLAR KWIKPEN) 100 UNIT/ML SOPN INJECT 14 UNITS TOTAL INTO THE SKIN DAILY. 3 pen 1  . insulin glargine (LANTUS) 100 unit/mL SOPN Inject 0.14 mLs (14 Units total) into the skin daily after breakfast. 15 mL 11  . Insulin Pen Needle 29G X 5MM MISC Use as directed 200 each 0  . JANUMET XR  50-500 MG TB24 TAKE 1 TABLET BY MOUTH EVERY DAY WITH EVENING MEAL,SWALLOW WHOLE. DO NOT CRUSH, CHEW AND/OR DIVIDE 30 tablet 0  . latanoprost (XALATAN) 0.005 % ophthalmic solution Place 1 drop into both eyes at bedtime.     Marland Kitchen levothyroxine (SYNTHROID) 25 MCG tablet TAKE ONE TABLET (25 MCG) BY MOUTH MONDAY- FRIDAY. TAKE TWO TABLETS (50 MCG) ON SATURDAY AND SUNDAY. 36 tablet 2  . metoprolol succinate (TOPROL-XL) 25 MG 24 hr tablet Take 1 tablet (25 mg total) by mouth daily. 30 tablet 5  . Multiple Vitamins-Minerals (CENTRUM SILVER 50+WOMEN) TABS Take 1 tablet by mouth daily.    . mupirocin ointment (BACTROBAN) 2 % Place 1  application into the nose daily.    Marland Kitchen ofloxacin (OCUFLOX) 0.3 % ophthalmic solution Instill 1 drop TID in operative eye starting 2 days prior to surgery and after surgery for 3 weeks.    Marland Kitchen oxycodone-acetaminophen (PERCOCET) 2.5-325 MG tablet Take 1 tablet by mouth every 8 (eight) hours as needed for pain. 15 tablet 0  . pantoprazole (PROTONIX) 40 MG tablet Take 1 tablet (40 mg total) by mouth daily. 30 tablet 5  . polyethylene glycol (MIRALAX / GLYCOLAX) packet Take 17 g by mouth 2 (two) times daily. 14 each 0  . potassium chloride SA (K-DUR,KLOR-CON) 20 MEQ tablet Take 1 tablet (20 mEq total) by mouth once for 1 dose. 60 tablet 5  . prednisoLONE acetate (PRED FORTE) 1 % ophthalmic suspension One drop in operative eye(s) TID starting 2 days before surgery and after surgery for 3 weeks    . senna (SENOKOT) 8.6 MG TABS tablet Take 2 tablets (17.2 mg total) by mouth at bedtime. 120 each 0  . sildenafil (REVATIO) 20 MG tablet Take 1 tablet (20 mg total) by mouth 3 (three) times daily. 90 tablet 0  . sodium hypochlorite (DAKIN'S 1/2 STRENGTH) external solution Irrigate with 1 application as directed daily.    Marland Kitchen spironolactone (ALDACTONE) 25 MG tablet Take 0.5 tablets (12.5 mg total) by mouth daily. 15 tablet 5  . traMADol (ULTRAM) 50 MG tablet Take 1 tablet (50 mg total) by mouth daily. 30  tablet 0   Current Facility-Administered Medications on File Prior to Visit  Medication Dose Route Frequency Provider Last Rate Last Dose  . sodium chloride flush (NS) 0.9 % injection 10 mL  10 mL Intravenous PRN [name], MD         Impression and Plan:  63 year old woman with:   1.  MDS with IPSS-R score was 2.5 diagnosed in 2017.   She remains on supportive care at this time after progression of disease outlined above.  Other treatment options were reviewed which includeLusaptercept versus continuing our best supportive care.  For the time being transfusion need has been manageable and she would like to continue this current approach.  2.  Anemia: Her hemoglobin today is 6.6 although she is mildly symptomatic we will proceed with 1 unit of packed red cells.  3. Hypertension: Blood pressure is mildly elevated today but has been under reasonable control otherwise.   4.  Aortic stenosis: No heart failure noted at this time.  She continues to follow with cardiology.  5.  Sacral wound: Continues to improve clinically without any recent exacerbation..  6.  Prognosis: Her disease is incurable although his performance status is adequate and aggressive therapy is recommended at this time.  7. Follow-up: Continue to follow up with laboratory evaluation every 3 weeks for potential transfusion and MD follow-up in 9 weeks.  25  minutes was spent with the patient face-to-face today.  More than 50% of time was s dedicated to reviewing her disease status, treatment options and answering questions regarding future plan of care.   Zola Button, MD 5/26/20201:12 PM

## 2018-12-09 ENCOUNTER — Ambulatory Visit (INDEPENDENT_AMBULATORY_CARE_PROVIDER_SITE_OTHER): Payer: BC Managed Care – PPO | Admitting: Nurse Practitioner

## 2018-12-09 ENCOUNTER — Encounter: Payer: Self-pay | Admitting: Nurse Practitioner

## 2018-12-09 VITALS — BP 132/78 | HR 50 | Temp 98.1°F | Ht 64.8 in | Wt 181.2 lb

## 2018-12-09 DIAGNOSIS — J302 Other seasonal allergic rhinitis: Secondary | ICD-10-CM | POA: Diagnosis not present

## 2018-12-09 DIAGNOSIS — I10 Essential (primary) hypertension: Secondary | ICD-10-CM

## 2018-12-09 DIAGNOSIS — E119 Type 2 diabetes mellitus without complications: Secondary | ICD-10-CM | POA: Diagnosis not present

## 2018-12-09 DIAGNOSIS — E039 Hypothyroidism, unspecified: Secondary | ICD-10-CM | POA: Diagnosis not present

## 2018-12-09 NOTE — Progress Notes (Signed)
Subjective:     Patient ID: Patricia Mata , female    DOB: 03-May-1956 , 63 y.o.   MRN: [id number]   Chief Complaint  Patient presents with  . Diabetes  . Hypertension    HPI  Allergic rhinitis -   Diabetes  She presents for her follow-up diabetic visit. She has type 2 diabetes mellitus. Her disease course has been stable. There are no hypoglycemic associated symptoms. Pertinent negatives for hypoglycemia include no dizziness or headaches. Pertinent negatives for diabetes include no blurred vision, no chest pain, no fatigue, no polydipsia, no polyphagia and no polyuria. There are no hypoglycemic complications. There are no diabetic complications. Risk factors for coronary artery disease include diabetes mellitus, sedentary lifestyle and obesity. Current diabetic treatment includes oral agent (monotherapy). (90 - 150, she will hold her basaglar when she has low blood sugars.  )  Hypertension  Pertinent negatives include no blurred vision, chest pain, headaches or palpitations.     Past Medical History:  Diagnosis Date  . Cancer [facility]) 2008   Colon   . Diabetes mellitus without complication ([place])   . Gout 09/08/2018  . Hypertension   . Macrocytic anemia      Family History  Problem Relation Age of Onset  . Hypertension Mother   . Diabetes Mother   . Cervical cancer Mother   . Heart attack Father   . Hypertension Sister   . Hypertension Brother   . Hypertension Sister   . Hypertension Sister   . Prostate cancer Brother   . HIV/AIDS Brother      Current Outpatient Medications:  .  amLODipine (NORVASC) 5 MG tablet, Take 1 tablet (5 mg total) by mouth daily., Disp: 90 tablet, Rfl: 0 .  atorvastatin (LIPITOR) 40 MG tablet, TAKE 1 TABLET BY MOUTH EVERY DAY, Disp: 90 tablet, Rfl: 0 .  blood glucose meter kit and supplies, Dispense based on patient and insurance preference. Use up to four times daily as directed. (FOR ICD-10 E10.9, E11.9)., Disp: 1 each, Rfl: 3 .  colchicine 0.6 MG  tablet, TAKE 1 TABLET BY MOUTH TWICE A DAY AS NEEDED FOR GOUT, Disp: 60 tablet, Rfl: 1 .  furosemide (LASIX) 40 MG tablet, Take 1 tablet (40 mg total) by mouth daily., Disp: 30 tablet, Rfl: 5 .  glucose blood test strip, Use as instructed, Disp: 100 each, Rfl: 12 .  hydrALAZINE (APRESOLINE) 100 MG tablet, Take 1 tablet (100 mg total) by mouth 3 (three) times daily., Disp: 180 tablet, Rfl: 5 .  Insulin Glargine (BASAGLAR KWIKPEN) 100 UNIT/ML SOPN, INJECT 14 UNITS TOTAL INTO THE SKIN DAILY., Disp: 3 pen, Rfl: 1 .  Insulin Pen Needle 29G X 5MM MISC, Use as directed, Disp: 200 each, Rfl: 0 .  JANUMET XR 50-500 MG TB24, TAKE 1 TABLET BY MOUTH EVERY DAY WITH EVENING MEAL,SWALLOW WHOLE. DO NOT CRUSH, CHEW AND/OR DIVIDE, Disp: 30 tablet, Rfl: 0 .  latanoprost (XALATAN) 0.005 % ophthalmic solution, Place 1 drop into both eyes at bedtime. , Disp: , Rfl:  .  levothyroxine (SYNTHROID) 25 MCG tablet, TAKE ONE TABLET (25 MCG) BY MOUTH MONDAY- FRIDAY. TAKE TWO TABLETS (50 MCG) ON SATURDAY AND SUNDAY., Disp: 36 tablet, Rfl: 2 .  metoprolol succinate (TOPROL-XL) 25 MG 24 hr tablet, Take 1 tablet (25 mg total) by mouth daily., Disp: 30 tablet, Rfl: 5 .  Multiple Vitamins-Minerals (CENTRUM SILVER 50+WOMEN) TABS, Take 1 tablet by mouth daily., Disp: , Rfl:  .  ofloxacin (OCUFLOX) 0.3 % ophthalmic solution,  Instill 1 drop TID in operative eye starting 2 days prior to surgery and after surgery for 3 weeks., Disp: , Rfl:  .  polyethylene glycol (MIRALAX / GLYCOLAX) packet, Take 17 g by mouth 2 (two) times daily., Disp: 14 each, Rfl: 0 .  prednisoLONE acetate (PRED FORTE) 1 % ophthalmic suspension, One drop in operative eye(s) TID starting 2 days before surgery and after surgery for 3 weeks, Disp: , Rfl:  .  senna (SENOKOT) 8.6 MG TABS tablet, Take 2 tablets (17.2 mg total) by mouth at bedtime., Disp: 120 each, Rfl: 0 .  sildenafil (REVATIO) 20 MG tablet, Take 1 tablet (20 mg total) by mouth 3 (three) times daily., Disp: 90  tablet, Rfl: 0 .  spironolactone (ALDACTONE) 25 MG tablet, Take 0.5 tablets (12.5 mg total) by mouth daily., Disp: 15 tablet, Rfl: 5 .  amiodarone (PACERONE) 200 MG tablet, TAKE 1 TABLET BY MOUTH EVERY DAY, Disp: 90 tablet, Rfl: 1 .  collagenase (SANTYL) ointment, Apply topically daily. (Patient not taking: Reported on 12/09/2018), Disp: 15 g, Rfl: 0 .  feeding supplement, GLUCERNA SHAKE, (GLUCERNA SHAKE) LIQD, Take 237 mLs by mouth 3 (three) times daily between meals. (Patient not taking: Reported on 12/09/2018), Disp: 90 Can, Rfl: 0 .  pantoprazole (PROTONIX) 40 MG tablet, Take 1 tablet (40 mg total) by mouth daily. (Patient not taking: Reported on 12/09/2018), Disp: 30 tablet, Rfl: 5 .  potassium chloride SA (K-DUR,KLOR-CON) 20 MEQ tablet, Take 1 tablet (20 mEq total) by mouth once for 1 dose., Disp: 60 tablet, Rfl: 5 .  traMADol (ULTRAM) 50 MG tablet, Take 1 tablet (50 mg total) by mouth daily., Disp: 30 tablet, Rfl: 0 No current facility-administered medications for this visit.   Facility-Administered Medications Ordered in Other Visits:  .  sodium chloride flush (NS) 0.9 % injection 10 mL, 10 mL, Intravenous, PRN, Alen Blew, Mathis Dad, MD   Allergies  Allergen Reactions  . Ancef [Cefazolin] Itching    Severe itching- after procedure, ancef was the antibiotic.-04/02/17  . Sulfa Antibiotics Rash and Other (See Comments)    Blisters, also     Review of Systems  Constitutional: Negative for fatigue.  Eyes: Negative for blurred vision.  Cardiovascular: Negative.  Negative for chest pain, palpitations and leg swelling.  Endocrine: Negative for polydipsia, polyphagia and polyuria.  Neurological: Negative for dizziness and headaches.     Today's Vitals   12/09/18 1106  BP: 132/78  Pulse: (!) 50  Temp: 98.1 F (36.7 C)  TempSrc: Oral  Weight: 181 lb 3.2 oz (82.2 kg)  Height: 5' 4.8" (1.646 m)  PainSc: 0-No pain   Body mass index is 30.34 kg/m.   Objective:  Physical  Exam Constitutional:      Appearance: Normal appearance.  Cardiovascular:     Rate and Rhythm: Normal rate and regular rhythm.     Pulses: Normal pulses.     Heart sounds: Normal heart sounds. No murmur.  Pulmonary:     Effort: Pulmonary effort is normal.     Breath sounds: Normal breath sounds.  Skin:    General: Skin is warm and dry.     Capillary Refill: Capillary refill takes less than 2 seconds.  Neurological:     General: No focal deficit present.     Mental Status: She is alert and oriented to person, place, and time.  Psychiatric:        Mood and Affect: Mood normal.        Behavior: Behavior normal.  Thought Content: Thought content normal.        Judgment: Judgment normal.         Assessment And Plan:     1. Essential hypertension . B/P is better controlled.  . CMP ordered to check renal function.  . The importance of regular exercise and dietary modification was stressed to the patient.  . Stressed importance of losing ten percent of her body weight to help with B/P control.  . The weight loss would help with decreasing cardiac and cancer risk as well.   2. Hypothyroidism, unspecified type  Chronic, controlled  Continue with current medications  Will check thyroid levels - TSH - T4 - T3, free  3. Type 2 diabetes mellitus without complication, without long-term current use of insulin (HCC)  Chronic, she reports she has not been taking the [place] except for twice since January   Averaging blood sugars less than 130  Will hold off on the Basaglar pending HgbA1c results  Continue with current medications  Encouraged to limit intake of sugary foods and drinks - Ambulatory referral to Podiatry - Hemoglobin A1c  4. Seasonal allergies  Given samples of nasocort and allegra  If not better return call to office  I have advised her as long as her family is comfortable with her driving while in the car she can drive. She has good strength 5/5  bilateral lower extremities.    [name], FNP    THE PATIENT IS ENCOURAGED TO PRACTICE SOCIAL DISTANCING DUE TO THE COVID-19 PANDEMIC.

## 2018-12-10 DIAGNOSIS — J302 Other seasonal allergic rhinitis: Secondary | ICD-10-CM | POA: Insufficient documentation

## 2018-12-10 DIAGNOSIS — E119 Type 2 diabetes mellitus without complications: Secondary | ICD-10-CM | POA: Insufficient documentation

## 2018-12-10 DIAGNOSIS — E039 Hypothyroidism, unspecified: Secondary | ICD-10-CM | POA: Insufficient documentation

## 2018-12-10 LAB — TSH: TSH: 80.02 u[IU]/mL — ABNORMAL HIGH (ref 0.450–4.500)

## 2018-12-10 LAB — HEMOGLOBIN A1C
Est. average glucose Bld gHb Est-mCnc: 120 mg/dL
Hgb A1c MFr Bld: 5.8 % — ABNORMAL HIGH (ref 4.8–5.6)

## 2018-12-10 LAB — T3, FREE: T3, Free: 1.1 pg/mL — ABNORMAL LOW (ref 2.0–4.4)

## 2018-12-10 LAB — T4: T4, Total: 5.6 ug/dL (ref 4.5–12.0)

## 2018-12-11 ENCOUNTER — Inpatient Hospital Stay: Payer: BC Managed Care – PPO

## 2018-12-11 ENCOUNTER — Other Ambulatory Visit: Payer: Self-pay | Admitting: Emergency Medicine

## 2018-12-11 ENCOUNTER — Other Ambulatory Visit: Payer: Self-pay

## 2018-12-11 DIAGNOSIS — D631 Anemia in chronic kidney disease: Secondary | ICD-10-CM

## 2018-12-11 DIAGNOSIS — D469 Myelodysplastic syndrome, unspecified: Secondary | ICD-10-CM | POA: Diagnosis not present

## 2018-12-11 DIAGNOSIS — N189 Chronic kidney disease, unspecified: Secondary | ICD-10-CM

## 2018-12-11 LAB — PREPARE RBC (CROSSMATCH)

## 2018-12-11 MED ORDER — DIPHENHYDRAMINE HCL 25 MG PO CAPS
ORAL_CAPSULE | ORAL | Status: AC
  Filled 2018-12-11: qty 1

## 2018-12-11 MED ORDER — ACETAMINOPHEN 325 MG PO TABS
ORAL_TABLET | ORAL | Status: AC
  Filled 2018-12-11: qty 2

## 2018-12-11 MED ORDER — HEPARIN SOD (PORK) LOCK FLUSH 100 UNIT/ML IV SOLN
250.0000 [IU] | INTRAVENOUS | Status: DC | PRN
  Filled 2018-12-11: qty 5

## 2018-12-11 MED ORDER — SODIUM CHLORIDE 0.9% FLUSH
10.0000 mL | INTRAVENOUS | Status: DC | PRN
  Filled 2018-12-11: qty 10

## 2018-12-11 MED ORDER — DIPHENHYDRAMINE HCL 25 MG PO CAPS
25.0000 mg | ORAL_CAPSULE | Freq: Once | ORAL | Status: AC
  Administered 2018-12-11: 11:00:00 25 mg via ORAL

## 2018-12-11 MED ORDER — SODIUM CHLORIDE 0.9% IV SOLUTION
250.0000 mL | Freq: Once | INTRAVENOUS | Status: AC
  Administered 2018-12-11: 11:00:00 250 mL via INTRAVENOUS
  Filled 2018-12-11: qty 250

## 2018-12-11 MED ORDER — ACETAMINOPHEN 325 MG PO TABS
650.0000 mg | ORAL_TABLET | Freq: Once | ORAL | Status: AC
  Administered 2018-12-11: 650 mg via ORAL

## 2018-12-11 MED ORDER — HEPARIN SOD (PORK) LOCK FLUSH 100 UNIT/ML IV SOLN
500.0000 [IU] | Freq: Every day | INTRAVENOUS | Status: DC | PRN
  Filled 2018-12-11: qty 5

## 2018-12-11 MED ORDER — SODIUM CHLORIDE 0.9% FLUSH
3.0000 mL | INTRAVENOUS | Status: DC | PRN
  Filled 2018-12-11: qty 10

## 2018-12-11 NOTE — Patient Instructions (Signed)
Blood Transfusion, Adult, Care After This sheet gives you information about how to care for yourself after your procedure. Your doctor may also give you more specific instructions. If you have problems or questions, contact your doctor. Follow these instructions at home:   Take over-the-counter and prescription medicines only as told by your doctor.  Go back to your normal activities as told by your doctor.  Follow instructions from your doctor about how to take care of the area where an IV tube was put into your vein (insertion site). Make sure you: ? Wash your hands with soap and water before you change your bandage (dressing). If there is no soap and water, use hand sanitizer. ? Change your bandage as told by your doctor.  Check your IV insertion site every day for signs of infection. Check for: ? More redness, swelling, or pain. ? More fluid or blood. ? Warmth. ? Pus or a bad smell. Contact a doctor if:  You have more redness, swelling, or pain around the IV insertion site.  You have more fluid or blood coming from the IV insertion site.  Your IV insertion site feels warm to the touch.  You have pus or a bad smell coming from the IV insertion site.  Your pee (urine) turns pink, red, or brown.  You feel weak after doing your normal activities. Get help right away if:  You have signs of a serious allergic or body defense (immune) system reaction, including: ? Itchiness. ? Hives. ? Trouble breathing. ? Anxiety. ? Pain in your chest or lower back. ? Fever, flushing, and chills. ? Fast pulse. ? Rash. ? Watery poop (diarrhea). ? Throwing up (vomiting). ? Dark pee. ? Serious headache. ? Dizziness. ? Stiff neck. ? Yellow color in your face or the white parts of your eyes (jaundice). Summary  After a blood transfusion, return to your normal activities as told by your doctor.  Every day, check for signs of infection where the IV tube was put into your vein.  Some  signs of infection are warm skin, more redness and pain, more fluid or blood, and pus or a bad smell where the needle went in.  Contact your doctor if you feel weak or have any unusual symptoms. This information is not intended to replace advice given to you by your health care provider. Make sure you discuss any questions you have with your health care provider. Document Released: 07/22/2014 Document Revised: 02/23/2016 Document Reviewed: 02/23/2016 Elsevier Interactive Patient Education  2019 Elsevier Inc.  

## 2018-12-13 LAB — BPAM RBC
Blood Product Expiration Date: 202006152359
ISSUE DATE / TIME: 202005291107
Unit Type and Rh: 600

## 2018-12-13 LAB — TYPE AND SCREEN
ABO/RH(D): A NEG
Antibody Screen: NEGATIVE
Unit division: 0

## 2018-12-17 ENCOUNTER — Other Ambulatory Visit (HOSPITAL_COMMUNITY): Payer: Self-pay

## 2018-12-17 MED ORDER — SILDENAFIL CITRATE 20 MG PO TABS: 20.0000 mg | ORAL_TABLET | Freq: Three times a day (TID) | ORAL | 3 refills | Status: DC

## 2018-12-17 NOTE — Progress Notes (Signed)
Also make sure she received her refill from her cardiologist for the siladenafil

## 2018-12-21 NOTE — Progress Notes (Signed)
She needs to increase to 2 tabs daily and return in 6 weeks recheck thyroid levels.

## 2018-12-22 ENCOUNTER — Telehealth (HOSPITAL_COMMUNITY): Payer: Self-pay | Admitting: Cardiology

## 2018-12-22 NOTE — Telephone Encounter (Signed)
Sildenafil PA additional information sent to [facility] at [phone] YB#[phone]

## 2018-12-23 ENCOUNTER — Encounter (HOSPITAL_COMMUNITY): Payer: Self-pay | Admitting: *Deleted

## 2018-12-23 NOTE — Telephone Encounter (Signed)
PA denied, appeal letter and OV notes faxed to [facility] appeals dept at [phone].  Pt previously had Medicaid that was paying for med and last got it on 11/20/2018, since that time Medicaid expired, pt is aware of this and states she will contact her case manager to f/u on what has happened.  Left mess on pt's VM about appeal being sent in.

## 2018-12-25 ENCOUNTER — Other Ambulatory Visit: Payer: Self-pay | Admitting: Nurse Practitioner

## 2018-12-28 ENCOUNTER — Other Ambulatory Visit: Payer: Self-pay

## 2018-12-28 DIAGNOSIS — D631 Anemia in chronic kidney disease: Secondary | ICD-10-CM

## 2018-12-28 DIAGNOSIS — N181 Chronic kidney disease, stage 1: Secondary | ICD-10-CM

## 2018-12-29 ENCOUNTER — Other Ambulatory Visit: Payer: Self-pay

## 2018-12-29 ENCOUNTER — Inpatient Hospital Stay: Payer: BC Managed Care – PPO | Attending: Oncology

## 2018-12-29 ENCOUNTER — Inpatient Hospital Stay: Payer: BC Managed Care – PPO

## 2018-12-29 DIAGNOSIS — D509 Iron deficiency anemia, unspecified: Secondary | ICD-10-CM

## 2018-12-29 DIAGNOSIS — D631 Anemia in chronic kidney disease: Secondary | ICD-10-CM | POA: Diagnosis present

## 2018-12-29 DIAGNOSIS — N183 Chronic kidney disease, stage 3 (moderate): Secondary | ICD-10-CM | POA: Diagnosis not present

## 2018-12-29 DIAGNOSIS — I129 Hypertensive chronic kidney disease with stage 1 through stage 4 chronic kidney disease, or unspecified chronic kidney disease: Secondary | ICD-10-CM | POA: Diagnosis not present

## 2018-12-29 DIAGNOSIS — N189 Chronic kidney disease, unspecified: Secondary | ICD-10-CM

## 2018-12-29 DIAGNOSIS — N181 Chronic kidney disease, stage 1: Secondary | ICD-10-CM

## 2018-12-29 LAB — CMP (CANCER CENTER ONLY)
ALT: 11 U/L (ref 0–44)
AST: 24 U/L (ref 15–41)
Albumin: 4.1 g/dL (ref 3.5–5.0)
Alkaline Phosphatase: 77 U/L (ref 38–126)
Anion gap: 13 (ref 5–15)
BUN: 73 mg/dL — ABNORMAL HIGH (ref 8–23)
CO2: 22 mmol/L (ref 22–32)
Calcium: 9.7 mg/dL (ref 8.9–10.3)
Chloride: 105 mmol/L (ref 98–111)
Creatinine: 2.77 mg/dL — ABNORMAL HIGH (ref 0.44–1.00)
GFR, Est AFR Am: 20 mL/min — ABNORMAL LOW (ref >60)
GFR, Estimated: 17 mL/min — ABNORMAL LOW (ref >60)
Glucose, Bld: 216 mg/dL — ABNORMAL HIGH (ref 70–99)
Potassium: 4.3 mmol/L (ref 3.5–5.1)
Sodium: 140 mmol/L (ref 135–145)
Total Bilirubin: 0.5 mg/dL (ref 0.3–1.2)
Total Protein: 8 g/dL (ref 6.5–8.1)

## 2018-12-29 LAB — CBC WITH DIFFERENTIAL (CANCER CENTER ONLY)
Abs Immature Granulocytes: 0.02 10*3/uL (ref 0.00–0.07)
Basophils Absolute: 0 10*3/uL (ref 0.0–0.1)
Basophils Relative: 0 %
Eosinophils Absolute: 0.1 10*3/uL (ref 0.0–0.5)
Eosinophils Relative: 2 %
HCT: 23.6 % — ABNORMAL LOW (ref 36.0–46.0)
Hemoglobin: 7.7 g/dL — ABNORMAL LOW (ref 12.0–15.0)
Immature Granulocytes: 0 %
Lymphocytes Relative: 13 %
Lymphs Abs: 0.7 10*3/uL (ref 0.7–4.0)
MCH: 31.3 pg (ref 26.0–34.0)
MCHC: 32.6 g/dL (ref 30.0–36.0)
MCV: 95.9 fL (ref 80.0–100.0)
Monocytes Absolute: 0.7 10*3/uL (ref 0.1–1.0)
Monocytes Relative: 13 %
Neutro Abs: 3.9 10*3/uL (ref 1.7–7.7)
Neutrophils Relative %: 72 %
Platelet Count: 197 10*3/uL (ref 150–400)
RBC: 2.46 MIL/uL — ABNORMAL LOW (ref 3.87–5.11)
RDW: 20.7 % — ABNORMAL HIGH (ref 11.5–15.5)
WBC Count: 5.5 10*3/uL (ref 4.0–10.5)
nRBC: 0 % (ref 0.0–0.2)

## 2018-12-29 LAB — SAMPLE TO BLOOD BANK

## 2018-12-29 LAB — PREPARE RBC (CROSSMATCH)

## 2018-12-29 MED ORDER — ACETAMINOPHEN 325 MG PO TABS
ORAL_TABLET | ORAL | Status: AC
  Filled 2018-12-29: qty 2

## 2018-12-29 MED ORDER — SODIUM CHLORIDE 0.9% IV SOLUTION
250.0000 mL | Freq: Once | INTRAVENOUS | Status: AC
  Administered 2018-12-29: 250 mL via INTRAVENOUS
  Filled 2018-12-29: qty 250

## 2018-12-29 MED ORDER — FUROSEMIDE 10 MG/ML IJ SOLN
20.0000 mg | Freq: Once | INTRAMUSCULAR | Status: AC
  Administered 2018-12-29: 20 mg via INTRAVENOUS

## 2018-12-29 MED ORDER — ACETAMINOPHEN 325 MG PO TABS
650.0000 mg | ORAL_TABLET | Freq: Once | ORAL | Status: AC
  Administered 2018-12-29: 650 mg via ORAL

## 2018-12-29 MED ORDER — FUROSEMIDE 10 MG/ML IJ SOLN
INTRAMUSCULAR | Status: AC
  Filled 2018-12-29: qty 2

## 2018-12-29 MED ORDER — DIPHENHYDRAMINE HCL 25 MG PO CAPS
ORAL_CAPSULE | ORAL | Status: AC
  Filled 2018-12-29: qty 1

## 2018-12-29 MED ORDER — DIPHENHYDRAMINE HCL 25 MG PO CAPS
25.0000 mg | ORAL_CAPSULE | Freq: Once | ORAL | Status: AC
  Administered 2018-12-29: 25 mg via ORAL

## 2018-12-29 NOTE — Progress Notes (Signed)
Dr. Alen Blew made aware of pt's BP on arrival to infusion room (180/70 manually). Pt denied any headache, dizziness, chest pain, etc. Received OK to go ahead with 1 unit PRBC without any BP medication. Will continue to monitor

## 2018-12-29 NOTE — Patient Instructions (Signed)
Blood Transfusion, Adult, Care After This sheet gives you information about how to care for yourself after your procedure. Your doctor may also give you more specific instructions. If you have problems or questions, contact your doctor. Follow these instructions at home:   Take over-the-counter and prescription medicines only as told by your doctor.  Go back to your normal activities as told by your doctor.  Follow instructions from your doctor about how to take care of the area where an IV tube was put into your vein (insertion site). Make sure you: ? Wash your hands with soap and water before you change your bandage (dressing). If there is no soap and water, use hand sanitizer. ? Change your bandage as told by your doctor.  Check your IV insertion site every day for signs of infection. Check for: ? More redness, swelling, or pain. ? More fluid or blood. ? Warmth. ? Pus or a bad smell. Contact a doctor if:  You have more redness, swelling, or pain around the IV insertion site.  You have more fluid or blood coming from the IV insertion site.  Your IV insertion site feels warm to the touch.  You have pus or a bad smell coming from the IV insertion site.  Your pee (urine) turns pink, red, or brown.  You feel weak after doing your normal activities. Get help right away if:  You have signs of a serious allergic or body defense (immune) system reaction, including: ? Itchiness. ? Hives. ? Trouble breathing. ? Anxiety. ? Pain in your chest or lower back. ? Fever, flushing, and chills. ? Fast pulse. ? Rash. ? Watery poop (diarrhea). ? Throwing up (vomiting). ? Dark pee. ? Serious headache. ? Dizziness. ? Stiff neck. ? Yellow color in your face or the white parts of your eyes (jaundice). Summary  After a blood transfusion, return to your normal activities as told by your doctor.  Every day, check for signs of infection where the IV tube was put into your vein.  Some  signs of infection are warm skin, more redness and pain, more fluid or blood, and pus or a bad smell where the needle went in.  Contact your doctor if you feel weak or have any unusual symptoms. This information is not intended to replace advice given to you by your health care provider. Make sure you discuss any questions you have with your health care provider. Document Released: 07/22/2014 Document Revised: 02/23/2016 Document Reviewed: 02/23/2016 Elsevier Interactive Patient Education  2019 Elsevier Inc.   Coronavirus (COVID-19) Are you at risk?  Are you at risk for the Coronavirus (COVID-19)?  To be considered HIGH RISK for Coronavirus (COVID-19), you have to meet the following criteria:  . Traveled to [place], [place], [place], [place] or [place]; or in the [place] to [place], [place], [place], or [place]; and have fever, cough, and shortness of breath within the last 2 weeks of travel OR . Been in close contact with a person diagnosed with COVID-19 within the last 2 weeks and have fever, cough, and shortness of breath . IF YOU DO NOT MEET THESE CRITERIA, YOU ARE CONSIDERED LOW RISK FOR COVID-19.  What to do if you are HIGH RISK for COVID-19?  . If you are having a medical emergency, call 911. . Seek medical care right away. Before you go to a doctor's office, urgent care or emergency department, call ahead and tell them about your recent travel, contact with someone diagnosed with   COVID-19, and your symptoms. You should receive instructions from your physician's office regarding next steps of care.  . When you arrive at healthcare provider, tell the healthcare staff immediately you have returned from visiting [place], [place], [place], [place] or [place]; or traveled in the [place] to [place], [place], [place], or [place]; in the last two weeks or you have been in close contact with a person diagnosed with COVID-19 in the last 2 weeks.   . Tell the health care  staff about your symptoms: fever, cough and shortness of breath. . After you have been seen by a medical provider, you will be either: o Tested for (COVID-19) and discharged home on quarantine except to seek medical care if symptoms worsen, and asked to  - Stay home and avoid contact with others until you get your results (4-5 days)  - Avoid travel on public transportation if possible (such as bus, train, or airplane) or o Sent to the Emergency Department by EMS for evaluation, COVID-19 testing, and possible admission depending on your condition and test results.  What to do if you are LOW RISK for COVID-19?  Reduce your risk of any infection by using the same precautions used for avoiding the common cold or flu:  . Wash your hands often with soap and warm water for at least 20 seconds.  If soap and water are not readily available, use an alcohol-based hand sanitizer with at least 60% alcohol.  . If coughing or sneezing, cover your mouth and nose by coughing or sneezing into the elbow areas of your shirt or coat, into a tissue or into your sleeve (not your hands). . Avoid shaking hands with others and consider head nods or verbal greetings only. . Avoid touching your eyes, nose, or mouth with unwashed hands.  . Avoid close contact with people who are sick. . Avoid places or events with large numbers of people in one location, like concerts or sporting events. . Carefully consider travel plans you have or are making. . If you are planning any travel outside or inside the [place], visit the CDC's Travelers' Health webpage for the latest health notices. . If you have some symptoms but not all symptoms, continue to monitor at home and seek medical attention if your symptoms worsen. . If you are having a medical emergency, call 911.   ADDITIONAL HEALTHCARE OPTIONS FOR PATIENTS  [facility] / e-Visit: [email or web address]         [facility]:  [phone]  [facility]: [phone]                   [facility]: [phone]   

## 2018-12-29 NOTE — Progress Notes (Signed)
After transfusion completed, patient reported she usually received IV Lasix with every transfusion. Spoke to Dr. Alen Blew about patient request and elevated BP. Orders received, repeated, and confirmed. Dr. Alen Blew advised ok to d/c after Lasix administered.

## 2018-12-30 LAB — BPAM RBC
Blood Product Expiration Date: 202007122359
ISSUE DATE / TIME: 202006161453
Unit Type and Rh: 600

## 2018-12-30 LAB — TYPE AND SCREEN
ABO/RH(D): A NEG
Antibody Screen: NEGATIVE
Unit division: 0

## 2019-01-01 ENCOUNTER — Telehealth: Payer: Self-pay

## 2019-01-01 ENCOUNTER — Telehealth (HOSPITAL_COMMUNITY): Payer: Self-pay

## 2019-01-01 NOTE — Telephone Encounter (Signed)
Received disability paper for [employer]. Paper work to be filled out and signed by Dr. Haroldine Laws. Pt called and made aware that it may take up to 2 week (01/15/19). Pt verbalized understanding.

## 2019-01-01 NOTE — Telephone Encounter (Signed)
Patient dropped off disability paperwork and it was placed in the box for pick up to be processed.

## 2019-01-06 ENCOUNTER — Telehealth: Payer: Self-pay

## 2019-01-06 NOTE — Telephone Encounter (Signed)
Called Sherilyn about Disibility claim forms that needed to be filled out by Dr. Tommy Medal, per Kimberly and Reedy.  Form faxed upon completion to fax number on form [phone].  Patient notified about forms filled out. Dari requested a copy be mailed to her as well.  Copy mailed out to current address on file.

## 2019-01-08 ENCOUNTER — Other Ambulatory Visit: Payer: Self-pay | Admitting: Nurse Practitioner

## 2019-01-08 ENCOUNTER — Other Ambulatory Visit: Payer: Self-pay

## 2019-01-08 ENCOUNTER — Ambulatory Visit (INDEPENDENT_AMBULATORY_CARE_PROVIDER_SITE_OTHER): Payer: BC Managed Care – PPO | Admitting: Podiatry

## 2019-01-08 ENCOUNTER — Ambulatory Visit (INDEPENDENT_AMBULATORY_CARE_PROVIDER_SITE_OTHER): Payer: BC Managed Care – PPO

## 2019-01-08 ENCOUNTER — Encounter: Payer: Self-pay | Admitting: Podiatry

## 2019-01-08 DIAGNOSIS — B351 Tinea unguium: Secondary | ICD-10-CM

## 2019-01-08 DIAGNOSIS — T560X1A Toxic effect of lead and its compounds, accidental (unintentional), initial encounter: Secondary | ICD-10-CM

## 2019-01-08 DIAGNOSIS — M10171 Lead-induced gout, right ankle and foot: Secondary | ICD-10-CM | POA: Diagnosis not present

## 2019-01-08 DIAGNOSIS — M6740 Ganglion, unspecified site: Secondary | ICD-10-CM

## 2019-01-08 DIAGNOSIS — E1122 Type 2 diabetes mellitus with diabetic chronic kidney disease: Secondary | ICD-10-CM

## 2019-01-08 DIAGNOSIS — R2240 Localized swelling, mass and lump, unspecified lower limb: Secondary | ICD-10-CM

## 2019-01-08 DIAGNOSIS — M7989 Other specified soft tissue disorders: Secondary | ICD-10-CM

## 2019-01-08 DIAGNOSIS — M1A0790 Idiopathic chronic gout, unspecified ankle and foot, without tophus (tophi): Secondary | ICD-10-CM

## 2019-01-08 DIAGNOSIS — M10179 Lead-induced gout, unspecified ankle and foot: Secondary | ICD-10-CM

## 2019-01-08 DIAGNOSIS — Z794 Long term (current) use of insulin: Secondary | ICD-10-CM

## 2019-01-08 DIAGNOSIS — N181 Chronic kidney disease, stage 1: Secondary | ICD-10-CM

## 2019-01-08 NOTE — Patient Instructions (Signed)
Diabetes Mellitus and Foot Care Foot care is an important part of your health, especially when you have diabetes. Diabetes may cause you to have problems because of poor blood flow (circulation) to your feet and legs, which can cause your skin to:  Become thinner and drier.  Break more easily.  Heal more slowly.  Peel and crack. You may also have nerve damage (neuropathy) in your legs and feet, causing decreased feeling in them. This means that you may not notice minor injuries to your feet that could lead to more serious problems. Noticing and addressing any potential problems early is the best way to prevent future foot problems. How to care for your feet Foot hygiene  Wash your feet daily with warm water and mild soap. Do not use hot water. Then, pat your feet and the areas between your toes until they are completely dry. Do not soak your feet as this can dry your skin.  Trim your toenails straight across. Do not dig under them or around the cuticle. File the edges of your nails with an emery board or nail file.  Apply a moisturizing lotion or petroleum jelly to the skin on your feet and to dry, brittle toenails. Use lotion that does not contain alcohol and is unscented. Do not apply lotion between your toes. Shoes and socks  Wear clean socks or stockings every day. Make sure they are not too tight. Do not wear knee-high stockings since they may decrease blood flow to your legs.  Wear shoes that fit properly and have enough cushioning. Always look in your shoes before you put them on to be sure there are no objects inside.  To break in new shoes, wear them for just a few hours a day. This prevents injuries on your feet. Wounds, scrapes, corns, and calluses  Check your feet daily for blisters, cuts, bruises, sores, and redness. If you cannot see the bottom of your feet, use a mirror or ask someone for help.  Do not cut corns or calluses or try to remove them with medicine.  If you  find a minor scrape, cut, or break in the skin on your feet, keep it and the skin around it clean and dry. You may clean these areas with mild soap and water. Do not clean the area with peroxide, alcohol, or iodine.  If you have a wound, scrape, corn, or callus on your foot, look at it several times a day to make sure it is healing and not infected. Check for: ? Redness, swelling, or pain. ? Fluid or blood. ? Warmth. ? Pus or a bad smell. General instructions  Do not cross your legs. This may decrease blood flow to your feet.  Do not use heating pads or hot water bottles on your feet. They may burn your skin. If you have lost feeling in your feet or legs, you may not know this is happening until it is too late.  Protect your feet from hot and cold by wearing shoes, such as at the beach or on hot pavement.  Schedule a complete foot exam at least once a year (annually) or more often if you have foot problems. If you have foot problems, report any cuts, sores, or bruises to your health care provider immediately. Contact a health care provider if:  You have a medical condition that increases your risk of infection and you have any cuts, sores, or bruises on your feet.  You have an injury that is not   healing.  You have redness on your legs or feet.  You feel burning or tingling in your legs or feet.  You have pain or cramps in your legs and feet.  Your legs or feet are numb.  Your feet always feel cold.  You have pain around a toenail. Get help right away if:  You have a wound, scrape, corn, or callus on your foot and: ? You have pain, swelling, or redness that gets worse. ? You have fluid or blood coming from the wound, scrape, corn, or callus. ? Your wound, scrape, corn, or callus feels warm to the touch. ? You have pus or a bad smell coming from the wound, scrape, corn, or callus. ? You have a fever. ? You have a red line going up your leg. Summary  Check your feet every day  for cuts, sores, red spots, swelling, and blisters.  Moisturize feet and legs daily.  Wear shoes that fit properly and have enough cushioning.  If you have foot problems, report any cuts, sores, or bruises to your health care provider immediately.  Schedule a complete foot exam at least once a year (annually) or more often if you have foot problems. This information is not intended to replace advice given to you by your health care provider. Make sure you discuss any questions you have with your health care provider. Document Released: 06/28/2000 Document Revised: 08/13/2017 Document Reviewed: 08/02/2016 Elsevier Patient Education  2020 Elsevier Inc.  

## 2019-01-11 ENCOUNTER — Other Ambulatory Visit: Payer: Self-pay

## 2019-01-11 DIAGNOSIS — N181 Chronic kidney disease, stage 1: Secondary | ICD-10-CM

## 2019-01-11 DIAGNOSIS — E1122 Type 2 diabetes mellitus with diabetic chronic kidney disease: Secondary | ICD-10-CM

## 2019-01-11 DIAGNOSIS — M1A0790 Idiopathic chronic gout, unspecified ankle and foot, without tophus (tophi): Secondary | ICD-10-CM

## 2019-01-11 DIAGNOSIS — M7989 Other specified soft tissue disorders: Secondary | ICD-10-CM

## 2019-01-11 DIAGNOSIS — M6740 Ganglion, unspecified site: Secondary | ICD-10-CM

## 2019-01-11 NOTE — Progress Notes (Signed)
Subjective: Patricia Mata presents today referred by [name], FNP for foot evaluation. Patient relates mass on top of left big toe which has been present for about one month induration. It is painless and it moves when manipulated. Onset was gradual. No aggravating factors that she is aware of. For treatment, she mentioned this to her PCP and she was referred to [place]. She is curious as to what it is. She does relate a history of gout.   She has been diabetic since 1999. Denies any history of foot wounds.  Denies numbess, tingling, burning, pins/needles sensations.   Past Medical History:  Diagnosis Date  . Cancer [facility]) 2008   Colon   . Diabetes mellitus without complication ([place])   . Gout 09/08/2018  . Hypertension   . Macrocytic anemia      Patient Active Problem List   Diagnosis Date Noted  . Type 2 diabetes mellitus without complication, without long-term current use of insulin ([place]) 12/10/2018  . Hypothyroidism 12/10/2018  . Seasonal allergies 12/10/2018  . Gout 09/08/2018  . Acute osteomyelitis of pelvic region and thigh ([place])   . 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 Decubitus ulcer of coccygeal region, stage 4 ([place])   . Sacral wound 06/08/2018  . Iron deficiency anemia due to chronic blood loss 06/08/2018  . Acute renal failure (ARF) ([place]) 06/08/2018  . Hyponatremia 06/08/2018  . Constipation 06/08/2018  . Palliative care encounter   . Pressure injury of skin 05/22/2018  . Acute kidney injury (nontraumatic) ([place])   . Lactic acidosis   . Acute hypoxemic respiratory failure ([place])   . Cardiogenic shock ([place])   . Atrial fibrillation with rapid ventricular response ([place]) 05/11/2018  . Symptomatic anemia 06/14/2017  . Severe aortic stenosis 06/10/2017  . Carotid artery disease ([place]) 06/10/2017  . Essential hypertension 05/13/2017  . Hyperlipidemia 05/13/2017  . Bilateral lower extremity edema 05/13/2017  . Port-A-Cath in place 04/28/2017  . MDS  (myelodysplastic syndrome) ([place]) 03/20/2017  . Goals of care, counseling/discussion 03/20/2017  . Anemia in chronic kidney disease 05/10/2016  . Anemia in stage 1 chronic kidney disease 05/10/2016  . Anemia 02/09/2016  . 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 COLON SURGERY    . DEBRIDMENT OF DECUBITUS ULCER N/A 06/12/2018   Procedure: DEBRIDMENT OF DECUBITUS ULCER;  Surgeon: Wallace Going, DO;  Location: [facility];  Service: Plastics;  Laterality: N/A;  . IR FLUORO GUIDE PORT INSERTION RIGHT  04/02/2017  . IR US GUIDE VASC ACCESS RIGHT  04/02/2017  . RIGHT/LEFT HEART CATH AND CORONARY ANGIOGRAPHY N/A 05/21/2018   Procedure: RIGHT/LEFT HEART CATH AND CORONARY ANGIOGRAPHY;  Surgeon: Nelva Bush, MD;  Location: [facility];  Service: Cardiovascular;  Laterality: N/A;      Current Outpatient Medications:  .  amiodarone (PACERONE) 200 MG tablet, TAKE 1 TABLET BY MOUTH EVERY DAY, Disp: 90 tablet, Rfl: 1 .  amLODipine (NORVASC) 5 MG tablet, Take 1 tablet (5 mg total) by mouth daily., Disp: 90 tablet, Rfl: 0 .  atorvastatin (LIPITOR) 40 MG tablet, TAKE 1 TABLET BY MOUTH EVERY DAY, Disp: 90 tablet, Rfl: 0 .  blood glucose meter kit and supplies, Dispense based on patient and insurance preference. Use up to four times daily as directed. (FOR ICD-10 E10.9, E11.9)., Disp: 1 each, Rfl: 3 .  ciprofloxacin (CIPRO) 250 MG tablet, , Disp: , Rfl:  .  colchicine 0.6 MG tablet, TAKE 1 TABLET BY MOUTH TWICE A DAY AS NEEDED FOR GOUT, Disp: 60 tablet, Rfl: 1 .  collagenase (SANTYL) ointment, Apply topically daily., Disp: 15 g, Rfl: 0 .  feeding supplement, GLUCERNA SHAKE, (GLUCERNA SHAKE) LIQD, Take 237 mLs by mouth 3 (three) times daily between meals., Disp: 90 Can, Rfl: 0 .  furosemide (LASIX) 40 MG tablet, Take 1 tablet (40 mg total) by mouth daily., Disp: 30 tablet, Rfl: 5 .  glucose blood test  strip, Use as instructed, Disp: 100 each, Rfl: 12 .  hydrALAZINE (APRESOLINE) 100 MG tablet, Take 1 tablet (100 mg total) by mouth 3 (three) times daily., Disp: 180 tablet, Rfl: 5 .  hydrALAZINE (APRESOLINE) 25 MG tablet, , Disp: , Rfl:  .  Insulin Glargine (BASAGLAR KWIKPEN) 100 UNIT/ML SOPN, INJECT 14 UNITS TOTAL INTO THE SKIN DAILY., Disp: 3 pen, Rfl: 1 .  Insulin Pen Needle 29G X 5MM MISC, Use as directed, Disp: 200 each, Rfl: 0 .  JANUMET XR 50-500 MG TB24, TAKE 1 TABLET BY MOUTH EVERY DAY WITH EVENING MEAL,SWALLOW WHOLE. DO NOT CRUSH, CHEW AND/OR DIVIDE, Disp: 30 tablet, Rfl: 0 .  ketorolac (ACULAR) 0.5 % ophthalmic solution, One drop in OS TID, Disp: , Rfl:  .  latanoprost (XALATAN) 0.005 % ophthalmic solution, Place 1 drop into both eyes at bedtime. , Disp: , Rfl:  .  levothyroxine (SYNTHROID) 25 MCG tablet, TAKE ONE TABLET (25 MCG) BY MOUTH MONDAY- FRIDAY. TAKE TWO TABLETS (50 MCG) ON SATURDAY AND SUNDAY., Disp: 36 tablet, Rfl: 2 .  metoprolol succinate (TOPROL-XL) 25 MG 24 hr tablet, Take 1 tablet (25 mg total) by mouth daily., Disp: 30 tablet, Rfl: 5 .  Multiple Vitamins-Minerals (CENTRUM SILVER 50+WOMEN) TABS, Take 1 tablet by mouth daily., Disp: , Rfl:  .  ofloxacin (OCUFLOX) 0.3 % ophthalmic solution, Instill 1 drop TID in operative eye starting 2 days prior to surgery and after surgery for 3 weeks., Disp: , Rfl:  .  pantoprazole (PROTONIX) 40 MG tablet, Take 1 tablet (40 mg total) by mouth daily., Disp: 30 tablet, Rfl: 5 .  polyethylene glycol (MIRALAX / GLYCOLAX) packet, Take 17 g by mouth 2 (two) times daily., Disp: 14 each, Rfl: 0 .  prednisoLONE acetate (PRED FORTE) 1 % ophthalmic suspension, One drop in operative eye(s) TID starting 2 days before surgery and after surgery for 3 weeks, Disp: , Rfl:  .  senna (SENOKOT) 8.6 MG TABS tablet, Take 2 tablets (17.2 mg total) by mouth at bedtime., Disp: 120 each, Rfl: 0 .  sildenafil (REVATIO) 20 MG tablet, Take 1 tablet (20 mg total) by  mouth 3 (three) times daily., Disp: 270 tablet, Rfl: 3 .  spironolactone (ALDACTONE) 25 MG tablet, Take 0.5 tablets (12.5 mg total) by mouth daily., Disp: 15 tablet, Rfl: 5 .  traMADol (ULTRAM) 50 MG tablet, Take 1 tablet (50 mg total) by mouth daily., Disp: 30 tablet, Rfl: 0 .  potassium chloride SA (K-DUR,KLOR-CON) 20 MEQ tablet, Take 1 tablet (20 mEq total) by mouth once for 1 dose., Disp: 60 tablet, Rfl: 5 No current facility-administered medications for this visit.   Facility-Administered Medications Ordered in Other Visits:  .  sodium chloride flush (NS) 0.9 % injection 10 mL, 10 mL, Intravenous, PRN, Alen Blew, Mathis Dad, MD   Allergies  Allergen Reactions  . Ancef [Cefazolin] Itching    Severe itching- after procedure, ancef was the antibiotic.-04/02/17  . Sulfa Antibiotics Rash and Other (See Comments)    Blisters,  also     Social History   Occupational History  . Not on file  Tobacco Use  . Smoking status: Former Smoker    Packs/day: 0.25    Years: 20.00    Pack years: 5.00    Types: Cigarettes    Quit date: 01/31/2001    Years since quitting: 17.9  . Smokeless tobacco: Former Network engineer and Sexual Activity  . Alcohol use: Yes    Alcohol/week: 2.0 standard drinks    Types: 2 Shots of liquor per week  . Drug use: Never  . Sexual activity: Not on file     Family History  Problem Relation Age of Onset  . Hypertension Mother   . Diabetes Mother   . Cervical cancer Mother   . Heart attack Father   . Hypertension Sister   . Hypertension Brother   . Hypertension Sister   . Hypertension Sister   . Prostate cancer Brother   . HIV/AIDS Brother      Immunization History  Administered Date(s) Administered  . Influenza,inj,Quad PF,6+ Mos 05/09/2017, 09/15/2018  . Influenza-Unspecified 04/05/2016     Review of systems: Positive Findings in bold print.  Constitutional:  chills, fatigue, fever, sweats, weight change Communication: Optometrist, sign Chief Technology Officer, hand writing, iPad/Android device Head: headaches, head injury Eyes: changes in vision, eye pain, glaucoma, cataracts, macular degeneration, diplopia, glare,  light sensitivity, eyeglasses or contacts, blindness Ears nose mouth throat: hearing impaired, hearing aids,  ringing in ears, deaf, sign language,  vertigo,   nosebleeds,  rhinitis,  cold sores, snoring, swollen glands Cardiovascular: HTN, edema, arrhythmia, pacemaker in place, defibrillator in place, chest pain/tightness, chronic anticoagulation, blood clot, heart failure, MI Peripheral Vascular: leg cramps, varicose veins, blood clots, lymphedema, varicosities Respiratory:  difficulty breathing, denies congestion, SOB, wheezing, cough, emphysema Gastrointestinal: change in appetite or weight, abdominal pain, constipation, diarrhea, nausea, vomiting, vomiting blood, change in bowel habits, abdominal pain, jaundice, rectal bleeding, hemorrhoids, GERD Genitourinary:  nocturia,  pain on urination, polyuria,  blood in urine, Foley catheter, urinary urgency, ESRD on hemodialysis Musculoskeletal: amputation, cramping, stiff joints, painful joints, decreased joint motion, fractures, OA, gout, hemiplegia, paraplegia, uses cane, wheelchair bound, uses walker, uses rollator Skin: +changes in toenails, color change, dryness, itching, mole changes,  rash, wound(s) Neurological: headaches, numbness in feet, paresthesias in feet, burning in feet, fainting,  seizures, change in speech. denies headaches, memory problems/poor historian, cerebral palsy, weakness, paralysis, CVA, TIA Endocrine: diabetes, hypothyroidism, hyperthyroidism,  goiter, dry mouth, flushing, heat intolerance,  cold intolerance,  excessive thirst, denies polyuria,  nocturia Hematological:  easy bleeding, excessive bleeding, easy bruising, enlarged lymph nodes, on long term blood thinner, history of past transusions Allergy/immunological:  hives, eczema, frequent infections,  multiple drug allergies, seasonal allergies, transplant recipient, multiple food allergies Psychiatric:  anxiety, depression, mood disorder, suicidal ideations, hallucinations, insomnia  Objective: There were no vitals filed for this visit.  Vascular Examination: Capillary refill time <3 seconds x 10 digits.  Dorsalis pedis pulses faintly palpable b/l.   Posterior tibial pulses nonpalpable b/l.   No digital hair x 10 digits.  Skin temperature gradient WNL b/l  Dermatological Examination: Skin with normal turgor, texture and tone b/l.  Toenails 1-5 b/l discolored, thick, dystrophic with subungual debris and pain with palpation to nailbeds due to thickness of nails.  Musculoskeletal: Muscle strength 5/5 to all LE muscle groups.  She does have a soft tissue mass overlying her left 1st MPJ which is soft and moveable. This is overlying her  MPJ and extensor tendon and is consistent with ganglion cyst.  There is a second soft tissue mass noted in the left 1st webspace, feels solid and rope-like in nature. There is no pain on palpation. It is fixed and feels osseous when palpated.   She also has what feels like osteophytes bilateral dorsal midfoot area.  Neurological: Sensation intact with 10 gram monofilament.  Vibratory sensation intact.  Xrays b/l feet: Evidence of calcified vessels Both feet:  there is a visble soft tissue process dorsally extending to midfoot and the 1st webspace consistent with the palpable soft tissue mass.  There appears to be scattered califications noted on both lower extremities.  Assessment: 1. Painful onychomycosis toenails 1-5 b/l  2. Ganglion cyst left foot 3. Soft tissue mass of questionable origin 4. Gout b/l feet  Plan: 1. Discussed onychomycosis and treatment options.  Literature dispensed on today. 2. Toenails 1-5 b/l were debrided in length and girth without iatrogenic bleeding. 3. Discussed her the soft tissue mass of her left 1st MPJ is  consistent with ganglion cyst, but the mass of her first webspace is not really visible on xray and MRI is warranted. I would like to get an MRI of both feet as she has h/o myelodysplastic syndrome. 4. Patient to continue soft, supportive shoe gear daily. 5. Patient to report any pedal injuries to medical professional immediately. 6. Follow up 3 months.  7. Patient/POA to call should there be a concern in the interim.

## 2019-01-18 ENCOUNTER — Ambulatory Visit: Payer: BC Managed Care – PPO | Admitting: Nurse Practitioner

## 2019-01-18 ENCOUNTER — Other Ambulatory Visit: Payer: Self-pay

## 2019-01-18 ENCOUNTER — Encounter: Payer: Self-pay | Admitting: Nurse Practitioner

## 2019-01-18 ENCOUNTER — Ambulatory Visit (INDEPENDENT_AMBULATORY_CARE_PROVIDER_SITE_OTHER): Payer: BC Managed Care – PPO | Admitting: Nurse Practitioner

## 2019-01-18 VITALS — BP 136/78 | HR 62 | Temp 98.5°F | Wt 180.2 lb

## 2019-01-18 DIAGNOSIS — J302 Other seasonal allergic rhinitis: Secondary | ICD-10-CM

## 2019-01-18 DIAGNOSIS — Z09 Encounter for follow-up examination after completed treatment for conditions other than malignant neoplasm: Secondary | ICD-10-CM

## 2019-01-18 DIAGNOSIS — R040 Epistaxis: Secondary | ICD-10-CM

## 2019-01-18 DIAGNOSIS — E039 Hypothyroidism, unspecified: Secondary | ICD-10-CM | POA: Diagnosis not present

## 2019-01-18 DIAGNOSIS — Z87898 Personal history of other specified conditions: Secondary | ICD-10-CM | POA: Diagnosis not present

## 2019-01-18 DIAGNOSIS — I10 Essential (primary) hypertension: Secondary | ICD-10-CM

## 2019-01-18 DIAGNOSIS — E119 Type 2 diabetes mellitus without complications: Secondary | ICD-10-CM

## 2019-01-18 MED ORDER — TRIAMCINOLONE ACETONIDE 55 MCG/ACT NA AERO: 1.0000 | INHALATION_SPRAY | Freq: Two times a day (BID) | NASAL | 2 refills | Status: DC

## 2019-01-18 MED ORDER — FEXOFENADINE HCL 180 MG PO TABS: 180.0000 mg | ORAL_TABLET | Freq: Every day | ORAL | 0 refills | Status: DC

## 2019-01-18 NOTE — Progress Notes (Signed)
Subjective:     Patient ID: Patricia Mata , female    DOB: [birth year] , 63 y.o.   MRN: [id number]   Chief Complaint  Patient presents with  . Follow-up    HPI  Hospital follow up when in [place] - blood pressure up to 214/90.  She was with her family on a family trip.  She does feel she was eating more fast food and had epstaxis.  Did not have any additional symptoms.  She was in [place] for 1 week.  Denies any symptoms of cough, fever and chills.    She has not had an MRI of her left foot.      Past Medical History:  Diagnosis Date  . Cancer [facility]) 2008   Colon   . Diabetes mellitus without complication ([place])   . Gout 09/08/2018  . Hypertension   . Macrocytic anemia      Family History  Problem Relation Age of Onset  . Hypertension Mother   . Diabetes Mother   . Cervical cancer Mother   . Heart attack Father   . Hypertension Sister   . Hypertension Brother   . Hypertension Sister   . Hypertension Sister   . Prostate cancer Brother   . HIV/AIDS Brother      Current Outpatient Medications:  .  amiodarone (PACERONE) 200 MG tablet, TAKE 1 TABLET BY MOUTH EVERY DAY, Disp: 90 tablet, Rfl: 1 .  amLODipine (NORVASC) 5 MG tablet, Take 1 tablet (5 mg total) by mouth daily., Disp: 90 tablet, Rfl: 0 .  atorvastatin (LIPITOR) 40 MG tablet, TAKE 1 TABLET BY MOUTH EVERY DAY, Disp: 90 tablet, Rfl: 0 .  blood glucose meter kit and supplies, Dispense based on patient and insurance preference. Use up to four times daily as directed. (FOR ICD-10 E10.9, E11.9)., Disp: 1 each, Rfl: 3 .  colchicine 0.6 MG tablet, TAKE 1 TABLET BY MOUTH TWICE A DAY AS NEEDED FOR GOUT, Disp: 180 tablet, Rfl: 1 .  collagenase (SANTYL) ointment, Apply topically daily., Disp: 15 g, Rfl: 0 .  feeding supplement, GLUCERNA SHAKE, (GLUCERNA SHAKE) LIQD, Take 237 mLs by mouth 3 (three) times daily between meals., Disp: 90 Can, Rfl: 0 .  furosemide (LASIX) 40 MG tablet, Take 1 tablet (40 mg total) by mouth daily.,  Disp: 30 tablet, Rfl: 5 .  glucose blood test strip, Use as instructed, Disp: 100 each, Rfl: 12 .  hydrALAZINE (APRESOLINE) 100 MG tablet, Take 1 tablet (100 mg total) by mouth 3 (three) times daily., Disp: 180 tablet, Rfl: 5 .  Insulin Glargine (BASAGLAR KWIKPEN) 100 UNIT/ML SOPN, INJECT 14 UNITS TOTAL INTO THE SKIN DAILY. (Patient taking differently: 10 Units. ), Disp: 3 pen, Rfl: 1 .  Insulin Pen Needle 29G X 5MM MISC, Use as directed, Disp: 200 each, Rfl: 0 .  JANUMET XR 50-500 MG TB24, TAKE 1 TABLET BY MOUTH EVERY DAY WITH EVENING MEAL,SWALLOW WHOLE. DO NOT CRUSH, CHEW AND/OR DIVIDE, Disp: 30 tablet, Rfl: 0 .  ketorolac (ACULAR) 0.5 % ophthalmic solution, One drop in OS TID, Disp: , Rfl:  .  latanoprost (XALATAN) 0.005 % ophthalmic solution, Place 1 drop into both eyes at bedtime. , Disp: , Rfl:  .  levothyroxine (SYNTHROID) 25 MCG tablet, TAKE ONE TABLET (25 MCG) BY MOUTH MONDAY- FRIDAY. TAKE TWO TABLETS (50 MCG) ON SATURDAY AND SUNDAY., Disp: 36 tablet, Rfl: 2 .  metoprolol succinate (TOPROL-XL) 25 MG 24 hr tablet, Take 1 tablet (25 mg total) by mouth daily., Disp: 30  tablet, Rfl: 5 .  Multiple Vitamins-Minerals (CENTRUM SILVER 50+WOMEN) TABS, Take 1 tablet by mouth daily., Disp: , Rfl:  .  ofloxacin (OCUFLOX) 0.3 % ophthalmic solution, Instill 1 drop TID in operative eye starting 2 days prior to surgery and after surgery for 3 weeks., Disp: , Rfl:  .  pantoprazole (PROTONIX) 40 MG tablet, Take 1 tablet (40 mg total) by mouth daily., Disp: 30 tablet, Rfl: 5 .  polyethylene glycol (MIRALAX / GLYCOLAX) packet, Take 17 g by mouth 2 (two) times daily., Disp: 14 each, Rfl: 0 .  prednisoLONE acetate (PRED FORTE) 1 % ophthalmic suspension, One drop in operative eye(s) TID starting 2 days before surgery and after surgery for 3 weeks, Disp: , Rfl:  .  senna (SENOKOT) 8.6 MG TABS tablet, Take 2 tablets (17.2 mg total) by mouth at bedtime., Disp: 120 each, Rfl: 0 .  sildenafil (REVATIO) 20 MG tablet,  Take 1 tablet (20 mg total) by mouth 3 (three) times daily., Disp: 270 tablet, Rfl: 3 .  spironolactone (ALDACTONE) 25 MG tablet, Take 0.5 tablets (12.5 mg total) by mouth daily., Disp: 15 tablet, Rfl: 5 .  traMADol (ULTRAM) 50 MG tablet, Take 1 tablet (50 mg total) by mouth daily., Disp: 30 tablet, Rfl: 0 .  hydrALAZINE (APRESOLINE) 25 MG tablet, , Disp: , Rfl:  .  potassium chloride SA (K-DUR,KLOR-CON) 20 MEQ tablet, Take 1 tablet (20 mEq total) by mouth once for 1 dose., Disp: 60 tablet, Rfl: 5 No current facility-administered medications for this visit.   Facility-Administered Medications Ordered in Other Visits:  .  sodium chloride flush (NS) 0.9 % injection 10 mL, 10 mL, Intravenous, PRN, Alen Blew, Mathis Dad, MD   Allergies  Allergen Reactions  . Ancef [Cefazolin] Itching    Severe itching- after procedure, ancef was the antibiotic.-04/02/17  . Sulfa Antibiotics Rash and Other (See Comments)    Blisters, also     Review of Systems   Today's Vitals   01/18/19 1222  BP: 136/78  Pulse: 62  Temp: 98.5 F (36.9 C)  TempSrc: Oral  Weight: 180 lb 3.2 oz (81.7 kg)  PainSc: 0-No pain   Body mass index is 30.17 kg/m.   Objective:  Physical Exam Vitals signs reviewed.  Constitutional:      Appearance: Normal appearance. She is obese.  Cardiovascular:     Rate and Rhythm: Normal rate and regular rhythm.     Pulses: Normal pulses.     Heart sounds: Murmur (chronic murmur present) present.  Pulmonary:     Effort: Pulmonary effort is normal.     Breath sounds: Normal breath sounds.  Skin:    General: Skin is warm and dry.     Capillary Refill: Capillary refill takes less than 2 seconds.  Neurological:     General: No focal deficit present.     Mental Status: She is alert and oriented to person, place, and time.  Psychiatric:        Mood and Affect: Mood normal.        Behavior: Behavior normal.        Thought Content: Thought content normal.        Judgment: Judgment normal.          Assessment And Plan:     1. Essential hypertension . B/P is fairly controlled.  . CMP ordered to check renal function.   2. Hypothyroidism, unspecified type  Chronic, had to make changes to levothyroxine at last visit,   Will recheck thyroid  levels today.   Continue with current medications pending results of labs - TSH - T3 - T4, Free  3. Type 2 diabetes mellitus without complication, without long-term current use of insulin (HCC)  Chronic, controlled  Continue with current medications  Encouraged to limit intake of sugary foods and drinks  4. Seasonal allergies  This may help with moisture of her nasal passages (triamcinolone) - triamcinolone (NASACORT) 55 MCG/ACT AERO nasal inhaler; Place 1 spray into the nose 2 (two) times daily.  Dispense: 1 Inhaler; Refill: 2 - fexofenadine (ALLEGRA ALLERGY) 180 MG tablet; Take 1 tablet (180 mg total) by mouth daily.  Dispense: 90 tablet; Refill: 0  5. Epistaxis  Seen in ER in [place] for nosebleed at the time her blood pressure was elevated.   She has not had any bleeding since that time and her blood pressure is improved.   [name], FNP    THE PATIENT IS ENCOURAGED TO PRACTICE SOCIAL DISTANCING DUE TO THE COVID-19 PANDEMIC.

## 2019-01-19 ENCOUNTER — Inpatient Hospital Stay: Payer: BC Managed Care – PPO

## 2019-01-19 ENCOUNTER — Telehealth: Payer: Self-pay | Admitting: *Deleted

## 2019-01-19 ENCOUNTER — Other Ambulatory Visit: Payer: Self-pay | Admitting: Nurse Practitioner

## 2019-01-19 ENCOUNTER — Inpatient Hospital Stay: Payer: BC Managed Care – PPO | Attending: Oncology

## 2019-01-19 ENCOUNTER — Telehealth: Payer: Self-pay | Admitting: Oncology

## 2019-01-19 DIAGNOSIS — D649 Anemia, unspecified: Secondary | ICD-10-CM | POA: Insufficient documentation

## 2019-01-19 DIAGNOSIS — D469 Myelodysplastic syndrome, unspecified: Secondary | ICD-10-CM | POA: Insufficient documentation

## 2019-01-19 DIAGNOSIS — E039 Hypothyroidism, unspecified: Secondary | ICD-10-CM

## 2019-01-19 LAB — T3: T3, Total: 48 ng/dL — ABNORMAL LOW (ref 71–180)

## 2019-01-19 LAB — TSH: TSH: 50.11 u[IU]/mL — ABNORMAL HIGH (ref 0.450–4.500)

## 2019-01-19 LAB — T4, FREE: Free T4: 0.93 ng/dL (ref 0.82–1.77)

## 2019-01-19 MED ORDER — LEVOTHYROXINE SODIUM 50 MCG PO TABS: ORAL_TABLET | ORAL | 1 refills | Status: DC

## 2019-01-19 NOTE — Telephone Encounter (Signed)
TCT patient as she did not arrive for labs/blood transfusion appts today. Spoke with patient to see if she was alright. She states she forgot as she had several MD appts lately.  She states she feels well and requested that these appts be re-scheduled.   Scheduling message sent

## 2019-01-19 NOTE — Telephone Encounter (Signed)
R/s appt per 7/7 sch message - pt aware of new appt date and time

## 2019-01-21 ENCOUNTER — Other Ambulatory Visit: Payer: Self-pay | Admitting: Podiatry

## 2019-01-21 ENCOUNTER — Other Ambulatory Visit (HOSPITAL_COMMUNITY): Payer: Self-pay

## 2019-01-21 ENCOUNTER — Other Ambulatory Visit: Payer: Self-pay

## 2019-01-21 DIAGNOSIS — M7989 Other specified soft tissue disorders: Secondary | ICD-10-CM

## 2019-01-21 DIAGNOSIS — D631 Anemia in chronic kidney disease: Secondary | ICD-10-CM

## 2019-01-21 DIAGNOSIS — E1122 Type 2 diabetes mellitus with diabetic chronic kidney disease: Secondary | ICD-10-CM

## 2019-01-21 DIAGNOSIS — M1A0790 Idiopathic chronic gout, unspecified ankle and foot, without tophus (tophi): Secondary | ICD-10-CM

## 2019-01-21 DIAGNOSIS — N181 Chronic kidney disease, stage 1: Secondary | ICD-10-CM

## 2019-01-21 DIAGNOSIS — M6740 Ganglion, unspecified site: Secondary | ICD-10-CM

## 2019-01-22 ENCOUNTER — Inpatient Hospital Stay: Payer: BC Managed Care – PPO

## 2019-01-22 ENCOUNTER — Other Ambulatory Visit: Payer: Self-pay

## 2019-01-22 ENCOUNTER — Telehealth: Payer: Self-pay | Admitting: Emergency Medicine

## 2019-01-22 DIAGNOSIS — N189 Chronic kidney disease, unspecified: Secondary | ICD-10-CM

## 2019-01-22 DIAGNOSIS — N181 Chronic kidney disease, stage 1: Secondary | ICD-10-CM

## 2019-01-22 DIAGNOSIS — D631 Anemia in chronic kidney disease: Secondary | ICD-10-CM

## 2019-01-22 DIAGNOSIS — D649 Anemia, unspecified: Secondary | ICD-10-CM | POA: Diagnosis not present

## 2019-01-22 DIAGNOSIS — D469 Myelodysplastic syndrome, unspecified: Secondary | ICD-10-CM | POA: Diagnosis present

## 2019-01-22 LAB — CBC WITH DIFFERENTIAL (CANCER CENTER ONLY)
Abs Immature Granulocytes: 0.02 10*3/uL (ref 0.00–0.07)
Basophils Absolute: 0 10*3/uL (ref 0.0–0.1)
Basophils Relative: 0 %
Eosinophils Absolute: 0.1 10*3/uL (ref 0.0–0.5)
Eosinophils Relative: 1 %
HCT: 21 % — ABNORMAL LOW (ref 36.0–46.0)
Hemoglobin: 6.8 g/dL — CL (ref 12.0–15.0)
Immature Granulocytes: 0 %
Lymphocytes Relative: 12 %
Lymphs Abs: 0.8 10*3/uL (ref 0.7–4.0)
MCH: 29.8 pg (ref 26.0–34.0)
MCHC: 32.4 g/dL (ref 30.0–36.0)
MCV: 92.1 fL (ref 80.0–100.0)
Monocytes Absolute: 0.9 10*3/uL (ref 0.1–1.0)
Monocytes Relative: 14 %
Neutro Abs: 4.6 10*3/uL (ref 1.7–7.7)
Neutrophils Relative %: 73 %
Platelet Count: 180 10*3/uL (ref 150–400)
RBC: 2.28 MIL/uL — ABNORMAL LOW (ref 3.87–5.11)
RDW: 20.4 % — ABNORMAL HIGH (ref 11.5–15.5)
WBC Count: 6.5 10*3/uL (ref 4.0–10.5)
nRBC: 0.3 % — ABNORMAL HIGH (ref 0.0–0.2)

## 2019-01-22 LAB — CMP (CANCER CENTER ONLY)
ALT: 11 U/L (ref 0–44)
AST: 25 U/L (ref 15–41)
Albumin: 3.9 g/dL (ref 3.5–5.0)
Alkaline Phosphatase: 88 U/L (ref 38–126)
Anion gap: 12 (ref 5–15)
BUN: 81 mg/dL — ABNORMAL HIGH (ref 8–23)
CO2: 22 mmol/L (ref 22–32)
Calcium: 9.6 mg/dL (ref 8.9–10.3)
Chloride: 102 mmol/L (ref 98–111)
Creatinine: 2.72 mg/dL — ABNORMAL HIGH (ref 0.44–1.00)
GFR, Est AFR Am: 21 mL/min — ABNORMAL LOW (ref >60)
GFR, Estimated: 18 mL/min — ABNORMAL LOW (ref >60)
Glucose, Bld: 181 mg/dL — ABNORMAL HIGH (ref 70–99)
Potassium: 4.3 mmol/L (ref 3.5–5.1)
Sodium: 136 mmol/L (ref 135–145)
Total Bilirubin: 0.5 mg/dL (ref 0.3–1.2)
Total Protein: 7.6 g/dL (ref 6.5–8.1)

## 2019-01-22 LAB — PREPARE RBC (CROSSMATCH)

## 2019-01-22 MED ORDER — ACETAMINOPHEN 325 MG PO TABS
650.0000 mg | ORAL_TABLET | Freq: Once | ORAL | Status: AC
  Administered 2019-01-22: 650 mg via ORAL

## 2019-01-22 MED ORDER — SODIUM CHLORIDE 0.9% IV SOLUTION
250.0000 mL | Freq: Once | INTRAVENOUS | Status: AC
  Administered 2019-01-22: 250 mL via INTRAVENOUS
  Filled 2019-01-22: qty 250

## 2019-01-22 MED ORDER — DIPHENHYDRAMINE HCL 25 MG PO CAPS
25.0000 mg | ORAL_CAPSULE | Freq: Once | ORAL | Status: AC
  Administered 2019-01-22: 25 mg via ORAL

## 2019-01-22 MED ORDER — FUROSEMIDE 10 MG/ML IJ SOLN
10.0000 mg | Freq: Once | INTRAMUSCULAR | Status: AC
  Administered 2019-01-22: 10 mg via INTRAMUSCULAR

## 2019-01-22 MED ORDER — FUROSEMIDE 10 MG/ML IJ SOLN
INTRAMUSCULAR | Status: AC
  Filled 2019-01-22: qty 2

## 2019-01-22 MED ORDER — DIPHENHYDRAMINE HCL 25 MG PO CAPS
ORAL_CAPSULE | ORAL | Status: AC
  Filled 2019-01-22: qty 1

## 2019-01-22 MED ORDER — ACETAMINOPHEN 325 MG PO TABS
ORAL_TABLET | ORAL | Status: AC
  Filled 2019-01-22: qty 23

## 2019-01-22 NOTE — Patient Instructions (Signed)
Blood Transfusion, Adult, Care After This sheet gives you information about how to care for yourself after your procedure. Your doctor may also give you more specific instructions. If you have problems or questions, contact your doctor. Follow these instructions at home:   Take over-the-counter and prescription medicines only as told by your doctor.  Go back to your normal activities as told by your doctor.  Follow instructions from your doctor about how to take care of the area where an IV tube was put into your vein (insertion site). Make sure you: ? Wash your hands with soap and water before you change your bandage (dressing). If there is no soap and water, use hand sanitizer. ? Change your bandage as told by your doctor.  Check your IV insertion site every day for signs of infection. Check for: ? More redness, swelling, or pain. ? More fluid or blood. ? Warmth. ? Pus or a bad smell. Contact a doctor if:  You have more redness, swelling, or pain around the IV insertion site.  You have more fluid or blood coming from the IV insertion site.  Your IV insertion site feels warm to the touch.  You have pus or a bad smell coming from the IV insertion site.  Your pee (urine) turns pink, red, or brown.  You feel weak after doing your normal activities. Get help right away if:  You have signs of a serious allergic or body defense (immune) system reaction, including: ? Itchiness. ? Hives. ? Trouble breathing. ? Anxiety. ? Pain in your chest or lower back. ? Fever, flushing, and chills. ? Fast pulse. ? Rash. ? Watery poop (diarrhea). ? Throwing up (vomiting). ? Dark pee. ? Serious headache. ? Dizziness. ? Stiff neck. ? Yellow color in your face or the white parts of your eyes (jaundice). Summary  After a blood transfusion, return to your normal activities as told by your doctor.  Every day, check for signs of infection where the IV tube was put into your vein.  Some  signs of infection are warm skin, more redness and pain, more fluid or blood, and pus or a bad smell where the needle went in.  Contact your doctor if you feel weak or have any unusual symptoms. This information is not intended to replace advice given to you by your health care provider. Make sure you discuss any questions you have with your health care provider. Document Released: 07/22/2014 Document Revised: 02/23/2016 Document Reviewed: 02/23/2016 Elsevier Interactive Patient Education  2019 Elsevier Inc.  

## 2019-01-22 NOTE — Telephone Encounter (Signed)
Alerted by lab of critical hemoglobin of 6.8 today.  RN Seth Bake for MD [facility] made aware.

## 2019-01-24 LAB — TYPE AND SCREEN
ABO/RH(D): A NEG
Antibody Screen: NEGATIVE
Unit division: 0

## 2019-01-24 LAB — BPAM RBC
Blood Product Expiration Date: 202008062359
ISSUE DATE / TIME: 202007101412
Unit Type and Rh: 600

## 2019-01-25 ENCOUNTER — Telehealth: Payer: Self-pay | Admitting: Podiatry

## 2019-01-25 NOTE — Telephone Encounter (Signed)
Pt MRI was denied, and [employer] sent a letter to her and Dr. Elisha Ponder. Please call patient

## 2019-01-25 NOTE — Telephone Encounter (Signed)
Called [employer] to f/u on Sildenafil appeal, they state it is still pending and have an expected turn around date of 7/22.  Called pt and made her aware, she states she is down to only 2 days left.  Called [facility] pharmacy and made them aware of situation, they state they gave pt a discount card last month so she got the med for $22, they state they will fill it for the same price this month and once insurance approves they will credit her back the money.  Pt is aware, agreeable and very thankful for all the help.

## 2019-01-27 ENCOUNTER — Ambulatory Visit (INDEPENDENT_AMBULATORY_CARE_PROVIDER_SITE_OTHER): Payer: BC Managed Care – PPO | Admitting: Infectious Disease

## 2019-01-27 ENCOUNTER — Encounter: Payer: Self-pay | Admitting: Infectious Disease

## 2019-01-27 ENCOUNTER — Other Ambulatory Visit: Payer: Self-pay

## 2019-01-27 ENCOUNTER — Telehealth: Payer: Self-pay

## 2019-01-27 VITALS — BP 210/70 | HR 57 | Temp 98.0°F | Wt 182.0 lb

## 2019-01-27 DIAGNOSIS — D649 Anemia, unspecified: Secondary | ICD-10-CM

## 2019-01-27 DIAGNOSIS — M86151 Other acute osteomyelitis, right femur: Secondary | ICD-10-CM | POA: Diagnosis not present

## 2019-01-27 DIAGNOSIS — D469 Myelodysplastic syndrome, unspecified: Secondary | ICD-10-CM | POA: Diagnosis not present

## 2019-01-27 DIAGNOSIS — I10 Essential (primary) hypertension: Secondary | ICD-10-CM

## 2019-01-27 DIAGNOSIS — A498 Other bacterial infections of unspecified site: Secondary | ICD-10-CM | POA: Diagnosis not present

## 2019-01-27 DIAGNOSIS — L89154 Pressure ulcer of sacral region, stage 4: Secondary | ICD-10-CM

## 2019-01-27 NOTE — Telephone Encounter (Signed)
Patient called stating she went to the doctor today and her blood pressure was 210/70.  I RETURNED HER CALL TO SEE IF SHE HAD TAKEN HER HTN MED AND SHE STATED SHE DID AT 8:30AM I ADVISED PT TO CALL HER CARDIOLOGIST BECAUSE THEY MAY NEED TO READJUST HER MEDICAITON. YRL,RMA

## 2019-01-27 NOTE — Progress Notes (Signed)
Subjective:  Chief complaint follow-up for sacral decubitus ulcer which is essentially completely healeed   Patient ID: Patricia Mata, female    DOB: [birth year], 63 y.o.   MRN: [id number]  HPI   Patricia Mata is a 63 y.o. female history severe aortic stenosis, myelodysplastic syndrome colon cancer status post hemicolectomy who had protracted hospital stay in which she had severe atrial fibrillation and hypotension all failure requiring CVVHD and multiple transfusions and a protracted stay in the ICU.  She developed a decubitus ulcer that has become a stage IV ulcer and has been at skilled nursing facility.  Sent to emergency department on November 25 due to worsening bleeding and blood clots found in her sacral decubitus ulcer.  She was seen by her wound care specialist Dr. Renard Hamper who try to cauterize this area but it continued to bleed.  She was admitted via the emergency department initially given antimicrobials in the form of vancomycin and metronidazole.  These were discontinued faction felt not to be present in the soft tissues.  Ultimately an MRI was performed which showed evidence of early coccygeal osteomyelitis.  Discontinue her antibiotics that she was on in the hospital and facilitated consultation with plastic surgery and Dr. Marla Roe performed I&D bone biopsy from the coccygeal region and sent this for culture and this grew Morganella morganii.  We had been treating her with Invanz for now 6 weeks.  Followed by cipro but then DC in6 February, 2020.  Only now has pain at site of her prior decubitus ulcer when she sits there for prolonged periods of time.  No fevers chills or other systemic symptoms  Wound continues to improve.  Her inflammatory markers have been elevated at times but again she does have myelodysplastic syndrome which I suspect confounds interpretation of these results.  She did have symptomatic anemia last week and received a blood transfusion.  In clinic today she was  quite hypertensive greater than 213 systolic blood pressure.  Not headaches or other systemic symptoms but I am concerned about her elevated blood pressure    Past Medical History:  Diagnosis Date  . Cancer [facility]) 2008   Colon   . Diabetes mellitus without complication ([place])   . Gout 09/08/2018  . Hypertension   . Macrocytic anemia     Past Surgical History:  Procedure Laterality Date  . COLON SURGERY    . DEBRIDMENT OF DECUBITUS ULCER N/A 06/12/2018   Procedure: DEBRIDMENT OF DECUBITUS ULCER;  Surgeon: Wallace Going, DO;  Location: [facility];  Service: Plastics;  Laterality: N/A;  . IR FLUORO GUIDE PORT INSERTION RIGHT  04/02/2017  . IR US GUIDE VASC ACCESS RIGHT  04/02/2017  . RIGHT/LEFT HEART CATH AND CORONARY ANGIOGRAPHY N/A 05/21/2018   Procedure: RIGHT/LEFT HEART CATH AND CORONARY ANGIOGRAPHY;  Surgeon: Nelva Bush, MD;  Location: [facility];  Service: Cardiovascular;  Laterality: N/A;    Family History  Problem Relation Age of Onset  . Hypertension Mother   . Diabetes Mother   . Cervical cancer Mother   . Heart attack Father   . Hypertension Sister   . Hypertension Brother   . Hypertension Sister   . Hypertension Sister   . Prostate cancer Brother   . HIV/AIDS Brother       Social History   Socioeconomic History  . Marital status: Married    Spouse name: Not on file  . Number of children: Not on file  . Years of education: Not on file  .  Highest education level: Not on file  Occupational History  . Not on file  Social Needs  . Financial resource strain: Not on file  . Food insecurity    Worry: Not on file    Inability: Not on file  . Transportation needs    Medical: Not on file    Non-medical: Not on file  Tobacco Use  . Smoking status: Former Smoker    Packs/day: 0.25    Years: 20.00    Pack years: 5.00    Types: Cigarettes    Quit date: 01/31/2001    Years since quitting: 18.0  . Smokeless tobacco: Former Network engineer and  Sexual Activity  . Alcohol use: Yes    Alcohol/week: 2.0 standard drinks    Types: 2 Shots of liquor per week  . Drug use: Never  . Sexual activity: Not on file  Lifestyle  . Physical activity    Days per week: Not on file    Minutes per session: Not on file  . Stress: Not on file  Relationships  . Social Herbalist on phone: Not on file    Gets together: Not on file    Attends religious service: Not on file    Active member of club or organization: Not on file    Attends meetings of clubs or organizations: Not on file    Relationship status: Not on file  Other Topics Concern  . Not on file  Social History Narrative  . Not on file    Allergies  Allergen Reactions  . Ancef [Cefazolin] Itching    Severe itching- after procedure, ancef was the antibiotic.-04/02/17  . Sulfa Antibiotics Rash and Other (See Comments)    Blisters, also     Current Outpatient Medications:  .  amiodarone (PACERONE) 200 MG tablet, TAKE 1 TABLET BY MOUTH EVERY DAY, Disp: 90 tablet, Rfl: 1 .  amLODipine (NORVASC) 5 MG tablet, Take 1 tablet (5 mg total) by mouth daily., Disp: 90 tablet, Rfl: 0 .  atorvastatin (LIPITOR) 40 MG tablet, TAKE 1 TABLET BY MOUTH EVERY DAY, Disp: 90 tablet, Rfl: 0 .  blood glucose meter kit and supplies, Dispense based on patient and insurance preference. Use up to four times daily as directed. (FOR ICD-10 E10.9, E11.9)., Disp: 1 each, Rfl: 3 .  colchicine 0.6 MG tablet, TAKE 1 TABLET BY MOUTH TWICE A DAY AS NEEDED FOR GOUT, Disp: 180 tablet, Rfl: 1 .  collagenase (SANTYL) ointment, Apply topically daily., Disp: 15 g, Rfl: 0 .  feeding supplement, GLUCERNA SHAKE, (GLUCERNA SHAKE) LIQD, Take 237 mLs by mouth 3 (three) times daily between meals., Disp: 90 Can, Rfl: 0 .  fexofenadine (ALLEGRA ALLERGY) 180 MG tablet, Take 1 tablet (180 mg total) by mouth daily., Disp: 90 tablet, Rfl: 0 .  furosemide (LASIX) 40 MG tablet, Take 1 tablet (40 mg total) by mouth daily., Disp:  30 tablet, Rfl: 5 .  glucose blood test strip, Use as instructed, Disp: 100 each, Rfl: 12 .  hydrALAZINE (APRESOLINE) 100 MG tablet, Take 1 tablet (100 mg total) by mouth 3 (three) times daily., Disp: 180 tablet, Rfl: 5 .  hydrALAZINE (APRESOLINE) 25 MG tablet, , Disp: , Rfl:  .  JANUMET XR 50-500 MG TB24, TAKE 1 TABLET BY MOUTH EVERY DAY WITH EVENING MEAL,SWALLOW WHOLE. DO NOT CRUSH, CHEW AND/OR DIVIDE, Disp: 30 tablet, Rfl: 0 .  ketorolac (ACULAR) 0.5 % ophthalmic solution, One drop in OS TID, Disp: , Rfl:  .  latanoprost (XALATAN) 0.005 % ophthalmic solution, Place 1 drop into both eyes at bedtime. , Disp: , Rfl:  .  levothyroxine (SYNTHROID) 50 MCG tablet, Take 1 tablet by mouth daily in AM, Disp: 90 tablet, Rfl: 1 .  metoprolol succinate (TOPROL-XL) 25 MG 24 hr tablet, Take 1 tablet (25 mg total) by mouth daily., Disp: 30 tablet, Rfl: 5 .  Multiple Vitamins-Minerals (CENTRUM SILVER 50+WOMEN) TABS, Take 1 tablet by mouth daily., Disp: , Rfl:  .  ofloxacin (OCUFLOX) 0.3 % ophthalmic solution, Instill 1 drop TID in operative eye starting 2 days prior to surgery and after surgery for 3 weeks., Disp: , Rfl:  .  pantoprazole (PROTONIX) 40 MG tablet, Take 1 tablet (40 mg total) by mouth daily., Disp: 30 tablet, Rfl: 5 .  polyethylene glycol (MIRALAX / GLYCOLAX) packet, Take 17 g by mouth 2 (two) times daily., Disp: 14 each, Rfl: 0 .  prednisoLONE acetate (PRED FORTE) 1 % ophthalmic suspension, One drop in operative eye(s) TID starting 2 days before surgery and after surgery for 3 weeks, Disp: , Rfl:  .  senna (SENOKOT) 8.6 MG TABS tablet, Take 2 tablets (17.2 mg total) by mouth at bedtime., Disp: 120 each, Rfl: 0 .  sildenafil (REVATIO) 20 MG tablet, Take 1 tablet (20 mg total) by mouth 3 (three) times daily., Disp: 270 tablet, Rfl: 3 .  spironolactone (ALDACTONE) 25 MG tablet, Take 0.5 tablets (12.5 mg total) by mouth daily., Disp: 15 tablet, Rfl: 5 .  traMADol (ULTRAM) 50 MG tablet, Take 1 tablet  (50 mg total) by mouth daily., Disp: 30 tablet, Rfl: 0 .  triamcinolone (NASACORT) 55 MCG/ACT AERO nasal inhaler, Place 1 spray into the nose 2 (two) times daily., Disp: 1 Inhaler, Rfl: 2 .  Insulin Pen Needle 29G X 5MM MISC, Use as directed (Patient not taking: Reported on 01/27/2019), Disp: 200 each, Rfl: 0 .  potassium chloride SA (K-DUR,KLOR-CON) 20 MEQ tablet, Take 1 tablet (20 mEq total) by mouth once for 1 dose., Disp: 60 tablet, Rfl: 5 No current facility-administered medications for this visit.   Facility-Administered Medications Ordered in Other Visits:  .  sodium chloride flush (NS) 0.9 % injection 10 mL, 10 mL, Intravenous, PRN, Alen Blew, Mathis Dad, MD   Review of Systems  Constitutional: Negative for activity change, appetite change, chills, diaphoresis, fatigue, fever and unexpected weight change.  HENT: Negative for congestion, dental problem, rhinorrhea, sinus pressure, sneezing, sore throat and trouble swallowing.   Eyes: Negative for photophobia and visual disturbance.  Respiratory: Negative for cough, chest tightness, shortness of breath, wheezing and stridor.   Cardiovascular: Negative for chest pain, palpitations and leg swelling.  Gastrointestinal: Negative for abdominal distention, abdominal pain, anal bleeding, blood in stool, constipation, diarrhea, nausea and vomiting.  Genitourinary: Negative for difficulty urinating, dysuria, flank pain and hematuria.  Musculoskeletal: Negative for arthralgias, gait problem, joint swelling and myalgias.  Skin: Positive for wound. Negative for color change, pallor and rash.  Neurological: Negative for dizziness, tremors, weakness and light-headedness.  Hematological: Negative for adenopathy. Does not bruise/bleed easily.  Psychiatric/Behavioral: Negative for agitation, behavioral problems, confusion, decreased concentration, dysphoric mood and sleep disturbance.       Objective:   Physical Exam Constitutional:      General: She is  not in acute distress.    Appearance: Normal appearance. She is well-developed. She is not ill-appearing or diaphoretic.  HENT:     Head: Normocephalic and atraumatic.     Right Ear: Hearing and external ear normal.  Left Ear: Hearing and external ear normal.     Nose: No nasal deformity or rhinorrhea.  Eyes:     General: No scleral icterus.    Conjunctiva/sclera: Conjunctivae normal.     Right eye: Right conjunctiva is not injected.     Left eye: Left conjunctiva is not injected.  Neck:     Musculoskeletal: Normal range of motion and neck supple.     Vascular: No JVD.  Cardiovascular:     Rate and Rhythm: Normal rate and regular rhythm.     Heart sounds: S1 normal and S2 normal.  Pulmonary:     Effort: Pulmonary effort is normal.     Breath sounds: No wheezing.  Abdominal:     General: There is no distension.  Musculoskeletal: Normal range of motion.     Right shoulder: Normal.     Left shoulder: Normal.     Right hip: Normal.     Left hip: Normal.     Right knee: Normal.     Left knee: Normal.  Lymphadenopathy:     Head:     Right side of head: No submandibular, preauricular or posterior auricular adenopathy.     Left side of head: No submandibular, preauricular or posterior auricular adenopathy.     Cervical:     Right cervical: No superficial or deep cervical adenopathy.    Left cervical: No superficial or deep cervical adenopathy.  Skin:    General: Skin is warm and dry.     Coloration: Skin is not pale.     Findings: No abrasion, bruising, ecchymosis, erythema, lesion or rash.     Nails: There is no clubbing.   Neurological:     General: No focal deficit present.     Mental Status: She is alert and oriented to person, place, and time.     Sensory: No sensory deficit.     Coordination: Coordination normal.     Gait: Gait normal.  Psychiatric:        Attention and Perception: She is attentive.        Mood and Affect: Mood normal.        Speech: Speech  normal.        Behavior: Behavior normal. Behavior is cooperative.        Thought Content: Thought content normal.        Judgment: Judgment normal.   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:    Check inflammatory markers again, though again we know that the  Myelodysplastic syndrome:s may potentially be a confounder   We will continue to observe her off antibiotics and see her back in 6 months time  Hypertension: Have asked her to obtain a better blood pressure cuff and to communicate closely with her primary care physician to optimize blood pressure in particular in light of risk for novel coronavirus 2019  Risk for novel coronavirus 2019: I have asked her to shelter in place  I spent greater than 25 minutes with the patient including  greater than 50% of time in face to face counsel of the patient regarding red flags for return of infection including constant pain, fever or other systemic symptoms, need to optimize her blood pressure and other overall health in part to prevent novel coronavirus 2019 infection

## 2019-01-28 LAB — BASIC METABOLIC PANEL WITH GFR
BUN/Creatinine Ratio: 32 (calc) — ABNORMAL HIGH (ref 6–22)
BUN: 72 mg/dL — ABNORMAL HIGH (ref 7–25)
CO2: 27 mmol/L (ref 20–32)
Calcium: 10.1 mg/dL (ref 8.6–10.4)
Chloride: 100 mmol/L (ref 98–110)
Creat: 2.24 mg/dL — ABNORMAL HIGH (ref 0.50–0.99)
GFR, Est African American: 26 mL/min/{1.73_m2} — ABNORMAL LOW (ref >=60)
GFR, Est Non African American: 22 mL/min/{1.73_m2} — ABNORMAL LOW (ref >=60)
Glucose, Bld: 163 mg/dL — ABNORMAL HIGH (ref 65–99)
Potassium: 4.3 mmol/L (ref 3.5–5.3)
Sodium: 135 mmol/L (ref 135–146)

## 2019-01-28 LAB — CBC WITH DIFFERENTIAL/PLATELET
Absolute Monocytes: 1043 cells/uL — ABNORMAL HIGH (ref 200–950)
Basophils Absolute: 18 cells/uL (ref 0–200)
Basophils Relative: 0.3 %
Eosinophils Absolute: 98 cells/uL (ref 15–500)
Eosinophils Relative: 1.6 %
HCT: 25.8 % — ABNORMAL LOW (ref 35.0–45.0)
Hemoglobin: 8.6 g/dL — ABNORMAL LOW (ref 11.7–15.5)
Lymphs Abs: 946 cells/uL (ref 850–3900)
MCH: 30.2 pg (ref 27.0–33.0)
MCHC: 33.3 g/dL (ref 32.0–36.0)
MCV: 90.5 fL (ref 80.0–100.0)
MPV: 13.5 fL — ABNORMAL HIGH (ref 7.5–12.5)
Monocytes Relative: 17.1 %
Neutro Abs: 3996 cells/uL (ref 1500–7800)
Neutrophils Relative %: 65.5 %
Platelets: 165 10*3/uL (ref 140–400)
RBC: 2.85 10*6/uL — ABNORMAL LOW (ref 3.80–5.10)
RDW: 17.7 % — ABNORMAL HIGH (ref 11.0–15.0)
Total Lymphocyte: 15.5 %
WBC: 6.1 10*3/uL (ref 3.8–10.8)

## 2019-01-28 LAB — C-REACTIVE PROTEIN: CRP: 4.2 mg/L (ref <8.0)

## 2019-01-28 LAB — SEDIMENTATION RATE: Sed Rate: 51 mm/h — ABNORMAL HIGH (ref 0–30)

## 2019-02-02 ENCOUNTER — Telehealth: Payer: Self-pay

## 2019-02-02 NOTE — Telephone Encounter (Signed)
error 

## 2019-02-03 ENCOUNTER — Telehealth: Payer: Self-pay

## 2019-02-03 NOTE — Telephone Encounter (Signed)
Patient called stating she picked up her levothyroxine and it was 35mcg and she is supposed to be taking 39mcg. Patient was advised to take 1 tablet and 1/2 tablet until her prescription is out and we will send her the new prescription. YRL,RMA

## 2019-02-08 ENCOUNTER — Other Ambulatory Visit: Payer: Self-pay | Admitting: *Deleted

## 2019-02-08 DIAGNOSIS — N189 Chronic kidney disease, unspecified: Secondary | ICD-10-CM

## 2019-02-08 DIAGNOSIS — D631 Anemia in chronic kidney disease: Secondary | ICD-10-CM

## 2019-02-09 ENCOUNTER — Inpatient Hospital Stay: Payer: BC Managed Care – PPO

## 2019-02-09 ENCOUNTER — Other Ambulatory Visit: Payer: Self-pay

## 2019-02-09 ENCOUNTER — Other Ambulatory Visit: Payer: Self-pay | Admitting: Oncology

## 2019-02-09 ENCOUNTER — Telehealth: Payer: Self-pay | Admitting: Oncology

## 2019-02-09 ENCOUNTER — Inpatient Hospital Stay (HOSPITAL_BASED_OUTPATIENT_CLINIC_OR_DEPARTMENT_OTHER): Payer: BC Managed Care – PPO | Admitting: Oncology

## 2019-02-09 VITALS — BP 172/44 | HR 80 | Temp 100.1°F | Resp 18 | Ht 64.8 in | Wt 186.9 lb

## 2019-02-09 DIAGNOSIS — D631 Anemia in chronic kidney disease: Secondary | ICD-10-CM

## 2019-02-09 DIAGNOSIS — N189 Chronic kidney disease, unspecified: Secondary | ICD-10-CM

## 2019-02-09 DIAGNOSIS — D469 Myelodysplastic syndrome, unspecified: Secondary | ICD-10-CM

## 2019-02-09 LAB — CMP (CANCER CENTER ONLY)
ALT: 8 U/L (ref 0–44)
AST: 20 U/L (ref 15–41)
Albumin: 3.7 g/dL (ref 3.5–5.0)
Alkaline Phosphatase: 81 U/L (ref 38–126)
Anion gap: 8 (ref 5–15)
BUN: 73 mg/dL — ABNORMAL HIGH (ref 8–23)
CO2: 25 mmol/L (ref 22–32)
Calcium: 9.5 mg/dL (ref 8.9–10.3)
Chloride: 103 mmol/L (ref 98–111)
Creatinine: 2.73 mg/dL — ABNORMAL HIGH (ref 0.44–1.00)
GFR, Est AFR Am: 20 mL/min — ABNORMAL LOW (ref >60)
GFR, Estimated: 18 mL/min — ABNORMAL LOW (ref >60)
Glucose, Bld: 204 mg/dL — ABNORMAL HIGH (ref 70–99)
Potassium: 4.7 mmol/L (ref 3.5–5.1)
Sodium: 136 mmol/L (ref 135–145)
Total Bilirubin: 0.5 mg/dL (ref 0.3–1.2)
Total Protein: 7.2 g/dL (ref 6.5–8.1)

## 2019-02-09 LAB — CBC WITH DIFFERENTIAL (CANCER CENTER ONLY)
Abs Immature Granulocytes: 0.04 10*3/uL (ref 0.00–0.07)
Basophils Absolute: 0 10*3/uL (ref 0.0–0.1)
Basophils Relative: 0 %
Eosinophils Absolute: 0.1 10*3/uL (ref 0.0–0.5)
Eosinophils Relative: 1 %
HCT: 21.8 % — ABNORMAL LOW (ref 36.0–46.0)
Hemoglobin: 7.1 g/dL — ABNORMAL LOW (ref 12.0–15.0)
Immature Granulocytes: 1 %
Lymphocytes Relative: 16 %
Lymphs Abs: 1 10*3/uL (ref 0.7–4.0)
MCH: 29.8 pg (ref 26.0–34.0)
MCHC: 32.6 g/dL (ref 30.0–36.0)
MCV: 91.6 fL (ref 80.0–100.0)
Monocytes Absolute: 1.1 10*3/uL — ABNORMAL HIGH (ref 0.1–1.0)
Monocytes Relative: 17 %
Neutro Abs: 3.9 10*3/uL (ref 1.7–7.7)
Neutrophils Relative %: 65 %
Platelet Count: 151 10*3/uL (ref 150–400)
RBC: 2.38 MIL/uL — ABNORMAL LOW (ref 3.87–5.11)
RDW: 19.3 % — ABNORMAL HIGH (ref 11.5–15.5)
WBC Count: 6.1 10*3/uL (ref 4.0–10.5)
nRBC: 0 % (ref 0.0–0.2)

## 2019-02-09 LAB — SAMPLE TO BLOOD BANK

## 2019-02-09 LAB — PREPARE RBC (CROSSMATCH)

## 2019-02-09 MED ORDER — FUROSEMIDE 10 MG/ML IJ SOLN
20.0000 mg | Freq: Once | INTRAMUSCULAR | Status: AC
  Administered 2019-02-09: 20 mg via INTRAVENOUS

## 2019-02-09 MED ORDER — ACETAMINOPHEN 325 MG PO TABS
650.0000 mg | ORAL_TABLET | Freq: Once | ORAL | Status: AC
  Administered 2019-02-09: 650 mg via ORAL

## 2019-02-09 MED ORDER — DIPHENHYDRAMINE HCL 25 MG PO CAPS
ORAL_CAPSULE | ORAL | Status: AC
  Filled 2019-02-09: qty 1

## 2019-02-09 MED ORDER — FUROSEMIDE 10 MG/ML IJ SOLN
INTRAMUSCULAR | Status: AC
  Filled 2019-02-09: qty 2

## 2019-02-09 MED ORDER — ACETAMINOPHEN 325 MG PO TABS
ORAL_TABLET | ORAL | Status: AC
  Filled 2019-02-09: qty 2

## 2019-02-09 MED ORDER — SODIUM CHLORIDE 0.9% IV SOLUTION
250.0000 mL | Freq: Once | INTRAVENOUS | Status: AC
  Administered 2019-02-09: 250 mL via INTRAVENOUS
  Filled 2019-02-09: qty 250

## 2019-02-09 MED ORDER — DIPHENHYDRAMINE HCL 25 MG PO CAPS
25.0000 mg | ORAL_CAPSULE | Freq: Once | ORAL | Status: AC
  Administered 2019-02-09: 25 mg via ORAL

## 2019-02-09 NOTE — Patient Instructions (Signed)
Blood Transfusion, Adult, Care After This sheet gives you information about how to care for yourself after your procedure. Your doctor may also give you more specific instructions. If you have problems or questions, contact your doctor. Follow these instructions at home:   Take over-the-counter and prescription medicines only as told by your doctor.  Go back to your normal activities as told by your doctor.  Follow instructions from your doctor about how to take care of the area where an IV tube was put into your vein (insertion site). Make sure you: ? Wash your hands with soap and water before you change your bandage (dressing). If there is no soap and water, use hand sanitizer. ? Change your bandage as told by your doctor.  Check your IV insertion site every day for signs of infection. Check for: ? More redness, swelling, or pain. ? More fluid or blood. ? Warmth. ? Pus or a bad smell. Contact a doctor if:  You have more redness, swelling, or pain around the IV insertion site.  You have more fluid or blood coming from the IV insertion site.  Your IV insertion site feels warm to the touch.  You have pus or a bad smell coming from the IV insertion site.  Your pee (urine) turns pink, red, or brown.  You feel weak after doing your normal activities. Get help right away if:  You have signs of a serious allergic or body defense (immune) system reaction, including: ? Itchiness. ? Hives. ? Trouble breathing. ? Anxiety. ? Pain in your chest or lower back. ? Fever, flushing, and chills. ? Fast pulse. ? Rash. ? Watery poop (diarrhea). ? Throwing up (vomiting). ? Dark pee. ? Serious headache. ? Dizziness. ? Stiff neck. ? Yellow color in your face or the white parts of your eyes (jaundice). Summary  After a blood transfusion, return to your normal activities as told by your doctor.  Every day, check for signs of infection where the IV tube was put into your vein.  Some  signs of infection are warm skin, more redness and pain, more fluid or blood, and pus or a bad smell where the needle went in.  Contact your doctor if you feel weak or have any unusual symptoms. This information is not intended to replace advice given to you by your health care provider. Make sure you discuss any questions you have with your health care provider. Document Released: 07/22/2014 Document Revised: 11/05/2017 Document Reviewed: 02/23/2016 Elsevier Patient Education  2020 Elsevier Inc.  

## 2019-02-09 NOTE — Telephone Encounter (Signed)
Called and spoke with patient. Confirmed date and time of appts

## 2019-02-09 NOTE — Progress Notes (Signed)
Hematology and Oncology Follow Up Visit  Patricia Mata [id number] 30-Jun-1956 63 y.o. 02/09/2019 10:29 AM   Principle Diagnosis: 63 year old woman with:   1.  Stage III colon cancer diagnosed in 2008.  He remains in remission at this time..  2.  Myelodysplastic syndrome (MDS) presented with anemia and found to have IPSS-R score was 2.5 at the time of diagnosis in 2017.    Prior Therapy: She is S/P hemicolectomy done in 11/2006. 1/13 lymph nodes are positive.  This was followed by 12 cycles of FOLFOX completed in 05/2007  Aranesp 300 mcg every 2 to 3 weeks therapy was ineffective to eliminate transfusion needs.  5-azacytidine at 75 mg/m square subcutaneously started in October 2018.  She is status post 7 cycles of therapy completed in April 2019.   Current therapy: Supportive care with packed red cell transfusion as needed.    Interim History:  Patricia Mata is here for a follow-up.  Since the last visit, he reports feeling reasonably well without any recent complaints.  She denies any recent hospitalization or illnesses.  She denies any excessive fatigue or tiredness.  She denies any shortness of breath or dyspnea on exertion.  No performance status remains adequate and she has been eating well.   Patient denied headaches, blurry vision, syncope or seizures.  Denies any fevers, chills or sweats.  Denied chest pain, palpitation, orthopnea or leg edema.  Denied cough, wheezing or hemoptysis.  Denied nausea, vomiting or abdominal pain.  Denies any constipation or diarrhea.  Denies any frequency urgency or hesitancy.  Denies any arthralgias or myalgias.  Denies any skin rashes or lesions.  Denies any bleeding or clotting tendency.  Denies any easy bruising.  Denies any hair or nail changes.  Denies any anxiety or depression.  Remaining review of system is negative.       Medications: Reviewed and updated today.  Allergies:  Allergies  Allergen Reactions  . Ancef [Cefazolin] Itching   Severe itching- after procedure, ancef was the antibiotic.-04/02/17  . Sulfa Antibiotics Rash and Other (See Comments)    Blisters, also     Physical Exam:      ECOG: 2   General appearance: Alert, awake without any distress. Head: Atraumatic without abnormalities Oropharynx: Without any thrush or ulcers. Eyes: No scleral icterus. Lymph nodes: No lymphadenopathy noted in the cervical, supraclavicular, or axillary nodes Heart:regular rate and rhythm, without any murmurs or gallops.   Lung: Clear to auscultation without any rhonchi, wheezes or dullness to percussion. Abdomin: Soft, nontender without any shifting dullness or ascites. Musculoskeletal: No clubbing or cyanosis. Neurological: No motor or sensory deficits. Skin: No rashes or lesions.            Lab Results: Lab Results  Component Value Date   WBC 6.1 01/27/2019   HGB 8.6 (L) 01/27/2019   HCT 25.8 (L) 01/27/2019   MCV 90.5 01/27/2019   PLT 165 01/27/2019     Chemistry      Component Value Date/Time   NA 135 01/27/2019 1355   NA 141 09/09/2018 1230   NA 134 (L) 05/26/2017 0949   K 4.3 01/27/2019 1355   K 4.0 05/26/2017 0949   CL 100 01/27/2019 1355   CL 96 (L) 03/31/2012 1513   CO2 27 01/27/2019 1355   CO2 24 05/26/2017 0949   BUN 72 (H) 01/27/2019 1355   BUN 68 (H) 09/09/2018 1230   BUN 29.2 (H) 05/26/2017 0949   CREATININE 2.24 (H) 01/27/2019 1355  CREATININE 1.4 (H) 05/26/2017 0949      Component Value Date/Time   CALCIUM 10.1 01/27/2019 1355   CALCIUM 8.9 05/26/2017 0949   ALKPHOS 88 01/22/2019 1222   ALKPHOS 157 (H) 05/26/2017 0949   AST 25 01/22/2019 1222   AST 29 05/26/2017 0949   ALT 11 01/22/2019 1222   ALT 23 05/26/2017 0949   BILITOT 0.5 01/22/2019 1222   BILITOT 2.19 (H) 05/26/2017 0949     Current Outpatient Medications on File Prior to Visit  Medication Sig Dispense Refill  . amiodarone (PACERONE) 200 MG tablet TAKE 1 TABLET BY MOUTH EVERY DAY 90 tablet 1  .  amLODipine (NORVASC) 5 MG tablet Take 1 tablet (5 mg total) by mouth daily. 90 tablet 0  . atorvastatin (LIPITOR) 40 MG tablet TAKE 1 TABLET BY MOUTH EVERY DAY 90 tablet 0  . blood glucose meter kit and supplies Dispense based on patient and insurance preference. Use up to four times daily as directed. (FOR ICD-10 E10.9, E11.9). 1 each 3  . colchicine 0.6 MG tablet TAKE 1 TABLET BY MOUTH TWICE A DAY AS NEEDED FOR GOUT 180 tablet 1  . collagenase (SANTYL) ointment Apply topically daily. 15 g 0  . feeding supplement, GLUCERNA SHAKE, (GLUCERNA SHAKE) LIQD Take 237 mLs by mouth 3 (three) times daily between meals. 90 Can 0  . fexofenadine (ALLEGRA ALLERGY) 180 MG tablet Take 1 tablet (180 mg total) by mouth daily. 90 tablet 0  . furosemide (LASIX) 40 MG tablet Take 1 tablet (40 mg total) by mouth daily. 30 tablet 5  . glucose blood test strip Use as instructed 100 each 12  . hydrALAZINE (APRESOLINE) 100 MG tablet Take 1 tablet (100 mg total) by mouth 3 (three) times daily. 180 tablet 5  . hydrALAZINE (APRESOLINE) 25 MG tablet     . Insulin Pen Needle 29G X 5MM MISC Use as directed (Patient not taking: Reported on 01/27/2019) 200 each 0  . JANUMET XR 50-500 MG TB24 TAKE 1 TABLET BY MOUTH EVERY DAY WITH EVENING MEAL,SWALLOW WHOLE. DO NOT CRUSH, CHEW AND/OR DIVIDE 30 tablet 0  . ketorolac (ACULAR) 0.5 % ophthalmic solution One drop in OS TID    . latanoprost (XALATAN) 0.005 % ophthalmic solution Place 1 drop into both eyes at bedtime.     Marland Kitchen levothyroxine (SYNTHROID) 50 MCG tablet Take 1 tablet by mouth daily in AM 90 tablet 1  . metoprolol succinate (TOPROL-XL) 25 MG 24 hr tablet Take 1 tablet (25 mg total) by mouth daily. 30 tablet 5  . Multiple Vitamins-Minerals (CENTRUM SILVER 50+WOMEN) TABS Take 1 tablet by mouth daily.    Marland Kitchen ofloxacin (OCUFLOX) 0.3 % ophthalmic solution Instill 1 drop TID in operative eye starting 2 days prior to surgery and after surgery for 3 weeks.    . pantoprazole (PROTONIX) 40  MG tablet Take 1 tablet (40 mg total) by mouth daily. 30 tablet 5  . polyethylene glycol (MIRALAX / GLYCOLAX) packet Take 17 g by mouth 2 (two) times daily. 14 each 0  . potassium chloride SA (K-DUR,KLOR-CON) 20 MEQ tablet Take 1 tablet (20 mEq total) by mouth once for 1 dose. 60 tablet 5  . prednisoLONE acetate (PRED FORTE) 1 % ophthalmic suspension One drop in operative eye(s) TID starting 2 days before surgery and after surgery for 3 weeks    . senna (SENOKOT) 8.6 MG TABS tablet Take 2 tablets (17.2 mg total) by mouth at bedtime. 120 each 0  . sildenafil (REVATIO) 20  MG tablet Take 1 tablet (20 mg total) by mouth 3 (three) times daily. 270 tablet 3  . spironolactone (ALDACTONE) 25 MG tablet Take 0.5 tablets (12.5 mg total) by mouth daily. 15 tablet 5  . traMADol (ULTRAM) 50 MG tablet Take 1 tablet (50 mg total) by mouth daily. 30 tablet 0  . triamcinolone (NASACORT) 55 MCG/ACT AERO nasal inhaler Place 1 spray into the nose 2 (two) times daily. 1 Inhaler 2   Current Facility-Administered Medications on File Prior to Visit  Medication Dose Route Frequency Provider Last Rate Last Dose  . sodium chloride flush (NS) 0.9 % injection 10 mL  10 mL Intravenous PRN [name], MD       Family history, social history and surgical history was reviewed and updated today.  Impression and Plan:  62 year old woman with:   1.  MDS diagnosed in 2017 and status post therapy outlined above.  She was found to have IPSS-R score was 2.5 at that time.  She remains on the supportive measures at this time after progressed on previous therapies.  Risks and benefits of this approach versus different salvage therapy were reviewed.  At this time he is willing to continue.  2.  Anemia: Her hemoglobin today 7.1 mildly symptomatic.  We will proceed with 1 unit of packed red cells.  3. Hypertension: Mild elevation noted although her blood pressure is under reasonable control usually.   4.  Aortic stenosis: He is  followed with cardiology at this time.  Not a candidate for surgery. without any recent exacerbation..  5.  Prognosis: Therapy remains palliative but aggressive measures are warranted given it reasonable performance status.  6. Follow-up: In 3 weeks for repeat laboratory testing and possible transfusion.  25  minutes was spent with the patient face-to-face today.  More than 50% of time was spent on reviewing her disease status, treatment options and answering questions regarding future plan of care   Zola Button, MD 7/28/202010:29 AM

## 2019-02-10 LAB — TYPE AND SCREEN
ABO/RH(D): A NEG
Antibody Screen: NEGATIVE
Unit division: 0

## 2019-02-10 LAB — BPAM RBC
Blood Product Expiration Date: 202008112359
ISSUE DATE / TIME: 202007281134
Unit Type and Rh: 600

## 2019-02-12 ENCOUNTER — Other Ambulatory Visit: Payer: BC Managed Care – PPO

## 2019-02-12 ENCOUNTER — Ambulatory Visit
Admission: RE | Admit: 2019-02-12 | Discharge: 2019-02-12 | Disposition: A | Discharge status: Home / Self Care | Payer: BC Managed Care – PPO | Source: Ambulatory Visit | Attending: Podiatry | Admitting: Podiatry

## 2019-02-12 ENCOUNTER — Other Ambulatory Visit: Payer: Self-pay

## 2019-02-12 DIAGNOSIS — M1A0790 Idiopathic chronic gout, unspecified ankle and foot, without tophus (tophi): Secondary | ICD-10-CM

## 2019-02-12 DIAGNOSIS — N181 Chronic kidney disease, stage 1: Secondary | ICD-10-CM

## 2019-02-12 DIAGNOSIS — M6740 Ganglion, unspecified site: Secondary | ICD-10-CM

## 2019-02-12 DIAGNOSIS — M7989 Other specified soft tissue disorders: Secondary | ICD-10-CM

## 2019-02-12 DIAGNOSIS — Z794 Long term (current) use of insulin: Secondary | ICD-10-CM

## 2019-02-12 DIAGNOSIS — E1122 Type 2 diabetes mellitus with diabetic chronic kidney disease: Secondary | ICD-10-CM

## 2019-02-13 ENCOUNTER — Other Ambulatory Visit: Payer: Self-pay

## 2019-02-13 DIAGNOSIS — Z20822 Contact with and (suspected) exposure to covid-19: Secondary | ICD-10-CM

## 2019-02-14 LAB — NOVEL CORONAVIRUS, NAA: SARS-CoV-2, NAA: NOT DETECTED

## 2019-02-15 ENCOUNTER — Ambulatory Visit (HOSPITAL_BASED_OUTPATIENT_CLINIC_OR_DEPARTMENT_OTHER)
Admission: RE | Admit: 2019-02-15 | Discharge: 2019-02-15 | Disposition: A | Discharge status: Home / Self Care | Payer: BC Managed Care – PPO | Source: Ambulatory Visit | Attending: Internal Medicine | Admitting: Internal Medicine

## 2019-02-15 ENCOUNTER — Ambulatory Visit (HOSPITAL_COMMUNITY)
Admission: RE | Admit: 2019-02-15 | Discharge: 2019-02-15 | Disposition: A | Discharge status: Home / Self Care | Payer: BC Managed Care – PPO | Source: Ambulatory Visit | Attending: Internal Medicine | Admitting: Internal Medicine

## 2019-02-15 ENCOUNTER — Other Ambulatory Visit: Payer: Self-pay

## 2019-02-15 ENCOUNTER — Encounter (HOSPITAL_COMMUNITY): Payer: Self-pay | Admitting: Internal Medicine

## 2019-02-15 VITALS — BP 184/54 | HR 54 | Wt 186.0 lb

## 2019-02-15 DIAGNOSIS — Z87891 Personal history of nicotine dependence: Secondary | ICD-10-CM | POA: Diagnosis not present

## 2019-02-15 DIAGNOSIS — N184 Chronic kidney disease, stage 4 (severe): Secondary | ICD-10-CM | POA: Insufficient documentation

## 2019-02-15 DIAGNOSIS — I480 Paroxysmal atrial fibrillation: Secondary | ICD-10-CM | POA: Insufficient documentation

## 2019-02-15 DIAGNOSIS — I080 Rheumatic disorders of both mitral and aortic valves: Secondary | ICD-10-CM | POA: Diagnosis not present

## 2019-02-15 DIAGNOSIS — R0683 Snoring: Secondary | ICD-10-CM | POA: Diagnosis not present

## 2019-02-15 DIAGNOSIS — Z881 Allergy status to other antibiotic agents status: Secondary | ICD-10-CM | POA: Insufficient documentation

## 2019-02-15 DIAGNOSIS — Z794 Long term (current) use of insulin: Secondary | ICD-10-CM | POA: Diagnosis not present

## 2019-02-15 DIAGNOSIS — Z85038 Personal history of other malignant neoplasm of large intestine: Secondary | ICD-10-CM | POA: Insufficient documentation

## 2019-02-15 DIAGNOSIS — Z79899 Other long term (current) drug therapy: Secondary | ICD-10-CM | POA: Insufficient documentation

## 2019-02-15 DIAGNOSIS — Z79890 Hormone replacement therapy (postmenopausal): Secondary | ICD-10-CM | POA: Diagnosis not present

## 2019-02-15 DIAGNOSIS — Z8249 Family history of ischemic heart disease and other diseases of the circulatory system: Secondary | ICD-10-CM | POA: Diagnosis not present

## 2019-02-15 DIAGNOSIS — E1122 Type 2 diabetes mellitus with diabetic chronic kidney disease: Secondary | ICD-10-CM | POA: Insufficient documentation

## 2019-02-15 DIAGNOSIS — Z882 Allergy status to sulfonamides status: Secondary | ICD-10-CM | POA: Diagnosis not present

## 2019-02-15 DIAGNOSIS — M869 Osteomyelitis, unspecified: Secondary | ICD-10-CM | POA: Insufficient documentation

## 2019-02-15 DIAGNOSIS — I6523 Occlusion and stenosis of bilateral carotid arteries: Secondary | ICD-10-CM | POA: Diagnosis not present

## 2019-02-15 DIAGNOSIS — R05 Cough: Secondary | ICD-10-CM | POA: Diagnosis not present

## 2019-02-15 DIAGNOSIS — I5030 Unspecified diastolic (congestive) heart failure: Secondary | ICD-10-CM | POA: Insufficient documentation

## 2019-02-15 DIAGNOSIS — D469 Myelodysplastic syndrome, unspecified: Secondary | ICD-10-CM | POA: Insufficient documentation

## 2019-02-15 DIAGNOSIS — I130 Hypertensive heart and chronic kidney disease with heart failure and stage 1 through stage 4 chronic kidney disease, or unspecified chronic kidney disease: Secondary | ICD-10-CM | POA: Insufficient documentation

## 2019-02-15 DIAGNOSIS — Z9049 Acquired absence of other specified parts of digestive tract: Secondary | ICD-10-CM | POA: Insufficient documentation

## 2019-02-15 DIAGNOSIS — Z833 Family history of diabetes mellitus: Secondary | ICD-10-CM | POA: Diagnosis not present

## 2019-02-15 DIAGNOSIS — I2721 Secondary pulmonary arterial hypertension: Secondary | ICD-10-CM | POA: Insufficient documentation

## 2019-02-15 DIAGNOSIS — R059 Cough, unspecified: Secondary | ICD-10-CM

## 2019-02-15 DIAGNOSIS — E039 Hypothyroidism, unspecified: Secondary | ICD-10-CM

## 2019-02-15 MED ORDER — AMLODIPINE BESYLATE 10 MG PO TABS: 10.0000 mg | ORAL_TABLET | Freq: Every day | ORAL | 3 refills | Status: DC

## 2019-02-15 NOTE — Progress Notes (Signed)
Echocardiogram 2D Echocardiogram has been performed.  Patricia Mata 02/15/2019, 11:02 AM

## 2019-02-15 NOTE — Progress Notes (Signed)
Advanced Heart Failure Clinic Note   Referring Physician: PCP: [name], FNP PCP-Cardiologist: Quay Burow, MD  Hematologist: Dr. Alen Blew  HPI:  Patricia Mata is a 63 y.o. 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 Followed by Dr Gwenlyn Found for severe AS and pulmonary HTN.  Admitted on 05/10/18  with AF with RVR and recurrent anemia. Developed respiratory failure and intubated. She was transfused and placed on IV amio. Subsequently developed AKI/ESRD and placed on CVVHD. Converted to NSR on amio and weaned off the vent.  Echoshowed normal LVEF 65-70% with moderate AS and moderate MS and severe PH with RVSP 30mHG. She is not a candidate for intervention of AS or MS with advanced PAH and transfusion-dependent myelodysplasia as a result of her colon CA  Underwent R/LHC that showed normal coronaries and severely elevated PA pressures.-> V/Q scan was negative. She was started on sildenafil and tolerated well. She diuresed with IV lasix and transitioned to lasix 40 mg PO BID. She did not require O2 at discharge.   She follows with Dr. SAlen Blewand requires transfusions every 2-3 weeks as an outpatient. She also developed a buttock wound. WOC was consulted and recommended hydrotherapy for coccygeal osteo,   Palliative care was consulted and decision was made to transition to DNR.  She presents today for regular follow up. Feeling much better. Says she can do ADLs without too much difficulty. Gets tired if she has too stand too long. Denies SOB if walking slowly. No edema, CP or presyncope. Coccyx wound now just about healed. No f/c or drainage. No LE edema. BP running high. Has seen it as high as 210/70. No smoking in over 20 years.   RHC 11/19  RA  14 RV  110/15 PA 105/40 (62) PCWP 25 Ao sat 91% PA sat 53% Fick CO/CI  6.8/4.3  PVR  5.4  AoV  Mean 276mG AVA 1.3 cm2  Echo today EF 65% RV normal size/function. Septal flattening. Severe AS (mean 4352m) RVSP 36m23m   EKG today with Sinus bradycardia 45 bpm, personally reviewed  Review of systems complete and found to be negative unless listed in HPI.    Past Medical History:  Diagnosis Date  [facility]   [facility]on   . Diabetes mellitus without complication (HCC)[place]. Gout 09/08/2018  . Hypertension   . Macrocytic anemia     Current Outpatient Medications  Medication Sig Dispense Refill  . amiodarone (PACERONE) 200 MG tablet TAKE 1 TABLET BY MOUTH EVERY DAY 90 tablet 1  . amLODipine (NORVASC) 5 MG tablet Take 1 tablet (5 mg total) by mouth daily. 90 tablet 0  . atorvastatin (LIPITOR) 40 MG tablet TAKE 1 TABLET BY MOUTH EVERY DAY 90 tablet 0  . blood glucose meter kit and supplies Dispense based on patient and insurance preference. Use up to four times daily as directed. (FOR ICD-10 E10.9, E11.9). 1 each 3  . colchicine 0.6 MG tablet TAKE 1 TABLET BY MOUTH TWICE A DAY AS NEEDED FOR GOUT 180 tablet 1  . collagenase (SANTYL) ointment Apply topically daily. 15 g 0  . feeding supplement, GLUCERNA SHAKE, (GLUCERNA SHAKE) LIQD Take 237 mLs by mouth 3 (three) times daily between meals. 90 Can 0  . fexofenadine (ALLEGRA ALLERGY) 180 MG tablet Take 1 tablet (180 mg total) by mouth daily. 90 tablet 0  . furosemide (LASIX) 40 MG tablet  Take 1 tablet (40 mg total) by mouth daily. 30 tablet 5  . glucose blood test strip Use as instructed 100 each 12  . hydrALAZINE (APRESOLINE) 100 MG tablet Take 1 tablet (100 mg total) by mouth 3 (three) times daily. 180 tablet 5  . hydrALAZINE (APRESOLINE) 25 MG tablet     . Insulin Pen Needle 29G X 5MM MISC Use as directed 200 each 0  . JANUMET XR 50-500 MG TB24 TAKE 1 TABLET BY MOUTH EVERY DAY WITH EVENING MEAL,SWALLOW WHOLE. DO NOT CRUSH, CHEW AND/OR DIVIDE 30 tablet 0  . ketorolac (ACULAR) 0.5 % ophthalmic solution One drop in OS TID     . latanoprost (XALATAN) 0.005 % ophthalmic solution Place 1 drop into both eyes at bedtime.     Marland Kitchen levothyroxine (SYNTHROID) 50 MCG tablet Take 1 tablet by mouth daily in AM 90 tablet 1  . metoprolol succinate (TOPROL-XL) 25 MG 24 hr tablet Take 1 tablet (25 mg total) by mouth daily. 30 tablet 5  . Multiple Vitamins-Minerals (CENTRUM SILVER 50+WOMEN) TABS Take 1 tablet by mouth daily.    Marland Kitchen ofloxacin (OCUFLOX) 0.3 % ophthalmic solution Instill 1 drop TID in operative eye starting 2 days prior to surgery and after surgery for 3 weeks.    . pantoprazole (PROTONIX) 40 MG tablet Take 1 tablet (40 mg total) by mouth daily. 30 tablet 5  . polyethylene glycol (MIRALAX / GLYCOLAX) packet Take 17 g by mouth 2 (two) times daily. 14 each 0  . prednisoLONE acetate (PRED FORTE) 1 % ophthalmic suspension One drop in operative eye(s) TID starting 2 days before surgery and after surgery for 3 weeks    . senna (SENOKOT) 8.6 MG TABS tablet Take 2 tablets (17.2 mg total) by mouth at bedtime. 120 each 0  . sildenafil (REVATIO) 20 MG tablet Take 1 tablet (20 mg total) by mouth 3 (three) times daily. 270 tablet 3  . spironolactone (ALDACTONE) 25 MG tablet Take 0.5 tablets (12.5 mg total) by mouth daily. 15 tablet 5  . traMADol (ULTRAM) 50 MG tablet Take 1 tablet (50 mg total) by mouth daily. 30 tablet 0  . triamcinolone (NASACORT) 55 MCG/ACT AERO nasal inhaler Place 1 spray into the nose 2 (two) times daily. 1 Inhaler 2  . potassium chloride SA (K-DUR,KLOR-CON) 20 MEQ tablet Take 1 tablet (20 mEq total) by mouth once for 1 dose. 60 tablet 5   No current facility-administered medications for this encounter.    Facility-Administered Medications Ordered in Other Encounters  Medication Dose Route Frequency Provider Last Rate Last Dose  . sodium chloride flush (NS) 0.9 % injection 10 mL  10 mL Intravenous PRN [name], MD        Allergies  Allergen Reactions  . Ancef [Cefazolin] Itching    Severe itching-  after procedure, ancef was the antibiotic.-04/02/17  . Sulfa Antibiotics Rash and Other (See Comments)    Blisters, also      Social History   Socioeconomic History  . Marital status: Married    Spouse name: Not on file  . Number of children: Not on file  . Years of education: Not on file  . Highest education level: Not on file  Occupational History  . Not on file  Social Needs  . Financial resource strain: Not on file  . Food insecurity    Worry: Not on file    Inability: Not on file  . Transportation needs    Medical: Not on file  Non-medical: Not on file  Tobacco Use  . Smoking status: Former Smoker    Packs/day: 0.25    Years: 20.00    Pack years: 5.00    Types: Cigarettes    Quit date: 01/31/2001    Years since quitting: 18.0  . Smokeless tobacco: Former Network engineer and Sexual Activity  . Alcohol use: Yes    Alcohol/week: 2.0 standard drinks    Types: 2 Shots of liquor per week  . Drug use: Never  . Sexual activity: Not on file  Lifestyle  . Physical activity    Days per week: Not on file    Minutes per session: Not on file  . Stress: Not on file  Relationships  . Social Herbalist on phone: Not on file    Gets together: Not on file    Attends religious service: Not on file    Active member of club or organization: Not on file    Attends meetings of clubs or organizations: Not on file    Relationship status: Not on file  . Intimate partner violence    Fear of current or ex partner: Not on file    Emotionally abused: Not on file    Physically abused: Not on file    Forced sexual activity: Not on file  Other Topics Concern  . Not on file  Social History Narrative  . Not on file      Family History  Problem Relation Age of Onset  . Hypertension Mother   . Diabetes Mother   . Cervical cancer Mother   . Heart attack Father   . Hypertension Sister   . Hypertension Brother   . Hypertension Sister   . Hypertension Sister   .  Prostate cancer Brother   . HIV/AIDS Brother    Vitals:   02/15/19 1105  BP: (!) 184/54  Pulse: (!) 54  SpO2: 100%  Weight: 84.4 kg (186 lb)    Wt Readings from Last 3 Encounters:  02/15/19 84.4 kg (186 lb)  02/09/19 84.8 kg (186 lb 14.4 oz)  01/27/19 82.6 kg (182 lb)    PHYSICAL EXAM: General:  Well appearing. No resp difficulty HEENT: normal Neck: supple. JVP 5-6. Carotids 2+ bilat; + bruits. No lymphadenopathy or thryomegaly appreciated. Cor: PMI nondisplaced. Regular rate & rhythm. 2/6 SEM at RUSB S2 only mildly decreased Lungs: clear Abdomen: soft, nontender, nondistended. No hepatosplenomegaly. No bruits or masses. Good bowel sounds. Extremities: no cyanosis, clubbing, rash, tr edema Neuro: alert & orientedx3, cranial nerves grossly intact. moves all 4 extremities w/o difficulty. Affect pleasant   ASSESSMENT & PLAN:  1. PAH - Echo 05/12/18 LVEF 65-70%, Grade 2 DD, Mod AS, Mild/Mod MS, Mod TR, Trivial PI, PA peak pressure 93.  - L/RHC 05/21/18 with no significant CAD, severe pulmonary HTN with PA 105/40 (62) PVR 5.4 WU - Suspect multifactorial in nature suspect including high output due to anemia, WHO group II (diastolic HF and valvular disease) and III (OSA) but given degree of PH cannot exclude WHO Group I component. - Overall much improved. Now NYHA II-III - Echo today with normal RV size and function. + septal flattening. RVSP 58mHG - VQ negative - Will get PFTs with DLCO and sleep study.  - Continue sildenafil 20 mg TID cautiously. Once we have PFTs and sleeps study will consider repeat RHC and consideration of ERA though need to be very careful with severe AS and high LV filling pressures - Volume status  stable on exam.  - Continue lasix 40 mg daily.  2. Moderate AS/MS - Echo today with severe AS but on exam sounds more moderate c/w previous cath. Will repeat on cath  - I will need to talk to Dr. Alen Blew regarding her candidacy for TAVR   3. PAF - Remains in  NSR on amio. Did not tolerate AF well at all. - Continue amiodarone 200 daily - Continue Toprol XL 25 mg daily (was on BID but cut back due to bradycardia)  - Would try and avoid anticoagulation with transfusion dependent anemia. (chronic).  - CHA2DS2/VASc is at least 4.   4. CKD 4 - Cr running 2.2-2.7   5. 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  - Hgb 7.1 02/09/2019. Has gotten 1u RBC last week   6. HTN - Remains hypertensive - Continue hydral 100 mg TID.  - Increase amlodipine to 10 mg daily.  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  - Resolved  8. Cough - check CXR  Total time spent 40 minutes. Over half that time spent discussing above.    Glori Bickers, MD 02/15/19   Greater than 50% of the 30 minute visit was spent in counseling/coordination of care regarding disease state education, salt/fluid restriction, sliding scale diuretics, and medication compliance.

## 2019-02-15 NOTE — Patient Instructions (Addendum)
Your provider has recommended that you have a home sleep study.  [employer] is the company that provides these and will send the equipment right to your home with instructions on how to set it up.  Once you have completed the test you just dispose of the equipment, the information is automatically uploaded to [place].  IF you have any questions or issues with the equipment please call the company.  If your test was positive and you need a home CPAP machine you will be contacted by Dr Theodosia Blender office [facility]) to set this up.  You have been referred to complete a Pulmonary Functions test, you will need to complete a COVID-19 test on August 10th prior to this procedure.   INCREASE Amiodarone to 10 mg (1 tab) daily.   You will complete a chest xray today.  Your physician recommends that you schedule a follow-up appointment in: 2-3 months.  At the [facility], you and your health needs are our priority. As part of our continuing mission to provide you with exceptional heart care, we have created designated Provider Care Teams. These Care Teams include your primary Cardiologist (physician) and Advanced Practice Providers (APPs- Physician Assistants and Nurse Practitioners) who all work together to provide you with the care you need, when you need it.   You may see any of the following providers on your designated Care Team at your next follow up: Marland Kitchen Dr Glori Bickers . Dr Loralie Champagne . Darrick Grinder, NP   Please be sure to bring in all your medications bottles to every appointment.

## 2019-02-16 ENCOUNTER — Encounter: Payer: Self-pay | Admitting: Sports Medicine

## 2019-02-16 ENCOUNTER — Telehealth: Payer: Self-pay | Admitting: *Deleted

## 2019-02-16 ENCOUNTER — Ambulatory Visit (INDEPENDENT_AMBULATORY_CARE_PROVIDER_SITE_OTHER): Payer: BC Managed Care – PPO | Admitting: Sports Medicine

## 2019-02-16 VITALS — Temp 98.5°F

## 2019-02-16 DIAGNOSIS — M1000 Idiopathic gout, unspecified site: Secondary | ICD-10-CM | POA: Diagnosis not present

## 2019-02-16 DIAGNOSIS — N181 Chronic kidney disease, stage 1: Secondary | ICD-10-CM

## 2019-02-16 DIAGNOSIS — M792 Neuralgia and neuritis, unspecified: Secondary | ICD-10-CM

## 2019-02-16 DIAGNOSIS — E1122 Type 2 diabetes mellitus with diabetic chronic kidney disease: Secondary | ICD-10-CM | POA: Diagnosis not present

## 2019-02-16 DIAGNOSIS — Z794 Long term (current) use of insulin: Secondary | ICD-10-CM

## 2019-02-16 MED ORDER — GABAPENTIN 300 MG PO CAPS: 300.0000 mg | ORAL_CAPSULE | Freq: Every day | ORAL | 3 refills | Status: DC

## 2019-02-16 NOTE — Patient Instructions (Signed)

## 2019-02-16 NOTE — Telephone Encounter (Signed)
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

## 2019-02-16 NOTE — Progress Notes (Signed)
Subjective: Patricia Mata is a 63 y.o. female patient who presents to office for evaluation of left foot soft tissue mass.  Patient reports that she does not have any pain currently and states that she went for her MRI and is here to discuss possible results.  Patient reports that her last visit Dr. Adah Perl did notice the best left big toe joint and became concerned and ordered a MRI and wanted her to follow-up with me for further discussion of possible surgical options depending on the results of the MRI.  Patient reports that she does have some soreness from time to time and stiffness admits to a history of gout takes colchicine whenever she feels like her toes are sore or she is feeling a flare.  Patient denies any recent redness warmth swelling beyond what is present at today's visit at the left big toe joint.  Patient is diabetic with last blood sugar not recorded and reports that she has sharp shooting pain sometimes and is wondering if there is anything that I can give to help treat that pain.  Patient Active Problem List   Diagnosis Date Noted  . Type 2 diabetes mellitus without complication, without long-term current use of insulin ([place]) 12/10/2018  . Hypothyroidism 12/10/2018  . Seasonal allergies 12/10/2018  . Gout 09/08/2018  . Acute osteomyelitis of pelvic region and thigh ([place])   . 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 Decubitus ulcer of coccygeal region, stage 4 ([place])   . Sacral wound 06/08/2018  . Iron deficiency anemia due to chronic blood loss 06/08/2018  . Acute renal failure (ARF) ([place]) 06/08/2018  . Hyponatremia 06/08/2018  . Constipation 06/08/2018  . Palliative care encounter   . Pressure injury of skin 05/22/2018  . Acute kidney injury (nontraumatic) ([place])   . Lactic acidosis   . Acute hypoxemic respiratory failure ([place])   . Cardiogenic shock ([place])   . Atrial fibrillation with rapid ventricular response ([place]) 05/11/2018  . Symptomatic anemia 06/14/2017  .  Severe aortic stenosis 06/10/2017  . Carotid artery disease ([place]) 06/10/2017  . Essential hypertension 05/13/2017  . Hyperlipidemia 05/13/2017  . Bilateral lower extremity edema 05/13/2017  . Port-A-Cath in place 04/28/2017  . MDS (myelodysplastic syndrome) ([place]) 03/20/2017  . Goals of care, counseling/discussion 03/20/2017  . Anemia in chronic kidney disease 05/10/2016  . Anemia in stage 1 chronic kidney disease 05/10/2016  . Anemia 02/09/2016  . 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 amiodarone (PACERONE) 200 MG tablet TAKE 1 TABLET BY MOUTH EVERY DAY 90 tablet 1  . amLODipine (NORVASC) 10 MG tablet Take 1 tablet (10 mg total) by mouth daily. 90 tablet 3  . atorvastatin (LIPITOR) 40 MG tablet TAKE 1 TABLET BY MOUTH EVERY DAY 90 tablet 0  . blood glucose meter kit and supplies Dispense based on patient and insurance preference. Use up to four times daily as directed. (FOR ICD-10 E10.9, E11.9). 1 each 3  . colchicine 0.6 MG tablet TAKE 1 TABLET BY MOUTH TWICE A DAY AS NEEDED FOR GOUT 180 tablet 1  . collagenase (SANTYL) ointment Apply topically daily. 15 g 0  . feeding supplement, GLUCERNA SHAKE, (GLUCERNA SHAKE) LIQD Take 237 mLs by mouth 3 (three) times daily between meals. 90 Can 0  . fexofenadine (ALLEGRA ALLERGY) 180 MG tablet Take 1 tablet (180 mg total) by mouth daily. 90 tablet  0  . furosemide (LASIX) 40 MG tablet Take 1 tablet (40 mg total) by mouth daily. 30 tablet 5  . glucose blood test strip Use as instructed 100 each 12  . hydrALAZINE (APRESOLINE) 100 MG tablet Take 1 tablet (100 mg total) by mouth 3 (three) times daily. 180 tablet 5  . hydrALAZINE (APRESOLINE) 25 MG tablet     . Insulin Pen Needle 29G X 5MM MISC Use as directed 200 each 0  . JANUMET XR 50-500 MG TB24 TAKE 1 TABLET BY MOUTH EVERY DAY WITH EVENING MEAL,SWALLOW WHOLE. DO NOT  CRUSH, CHEW AND/OR DIVIDE 30 tablet 0  . ketorolac (ACULAR) 0.5 % ophthalmic solution One drop in OS TID    . latanoprost (XALATAN) 0.005 % ophthalmic solution Place 1 drop into both eyes at bedtime.     Marland Kitchen levothyroxine (SYNTHROID) 50 MCG tablet Take 1 tablet by mouth daily in AM 90 tablet 1  . metoprolol succinate (TOPROL-XL) 25 MG 24 hr tablet Take 1 tablet (25 mg total) by mouth daily. 30 tablet 5  . Multiple Vitamins-Minerals (CENTRUM SILVER 50+WOMEN) TABS Take 1 tablet by mouth daily.    Marland Kitchen ofloxacin (OCUFLOX) 0.3 % ophthalmic solution Instill 1 drop TID in operative eye starting 2 days prior to surgery and after surgery for 3 weeks.    . pantoprazole (PROTONIX) 40 MG tablet Take 1 tablet (40 mg total) by mouth daily. 30 tablet 5  . polyethylene glycol (MIRALAX / GLYCOLAX) packet Take 17 g by mouth 2 (two) times daily. 14 each 0  . potassium chloride SA (K-DUR,KLOR-CON) 20 MEQ tablet Take 1 tablet (20 mEq total) by mouth once for 1 dose. 60 tablet 5  . prednisoLONE acetate (PRED FORTE) 1 % ophthalmic suspension One drop in operative eye(s) TID starting 2 days before surgery and after surgery for 3 weeks    . senna (SENOKOT) 8.6 MG TABS tablet Take 2 tablets (17.2 mg total) by mouth at bedtime. 120 each 0  . sildenafil (REVATIO) 20 MG tablet Take 1 tablet (20 mg total) by mouth 3 (three) times daily. 270 tablet 3  . spironolactone (ALDACTONE) 25 MG tablet Take 0.5 tablets (12.5 mg total) by mouth daily. 15 tablet 5  . traMADol (ULTRAM) 50 MG tablet Take 1 tablet (50 mg total) by mouth daily. 30 tablet 0  . triamcinolone (NASACORT) 55 MCG/ACT AERO nasal inhaler Place 1 spray into the nose 2 (two) times daily. 1 Inhaler 2   Current Facility-Administered Medications on File Prior to Visit  Medication Dose Route Frequency Provider Last Rate Last Dose  . sodium chloride flush (NS) 0.9 % injection 10 mL  10 mL Intravenous PRN [name], MD        Allergies  Allergen Reactions  . Ancef  [Cefazolin] Itching    Severe itching- after procedure, ancef was the antibiotic.-04/02/17  . Sulfa Antibiotics Rash and Other (See Comments)    Blisters, also    Objective:  General: Alert and oriented x3 in no acute distress  Dermatology: Raised soft tissue mass measuring less than 3 cm corpus luteum first metatarsophalangeal joint on the left that is soft and movable over the joint that extends into the first webspace with no pain to palpation.  No open lesions bilateral lower extremities, no webspace macerations, no ecchymosis bilateral, all nails x 10 are well manicured.  Vascular: Dorsalis Pedis and Posterior Tibial pedal pulses palpable.  Neurology: Gross sensation intact via light touch bilateral.  Subjective sharp shooting pains that  is random at bedtime likely probable of neuritis secondary to diabetes.  Musculoskeletal: No reproducible tenderness bilateral.  There is mild bunion deformity with dorsal gouty tophi noted at the left metatarsophalangeal joint.  Assessment and Plan: Problem List Items Addressed This Visit      Other   Gout - Primary    Other Visit Diagnoses    Neuritis       Type 2 diabetes mellitus with stage 1 chronic kidney disease, with long-term current use of insulin ([place])           -Complete examination performed -Discussed treatement options for gouty tophi as exhibited on MRI -Gout education provided -Discussed with patient conservative treatment with rheumatology for likely Krystexxa infusions versus surgical excision.  Patient elects for conservative care and it wants to see a rheumatologist.  Referral was made for rheumatology to assist with treating chronic gout in the setting of a very large tophi noted to the left first metatarsophalangeal joint -Rx gabapentin 300 mg at bedtime for shooting pain -Patient to return to office after rheumatology or sooner if condition worsens.  Landis Martins, DPM

## 2019-02-17 NOTE — Telephone Encounter (Signed)
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

## 2019-02-17 NOTE — Telephone Encounter (Signed)
Faxed required form, clinicals and demographics to [facility]. 

## 2019-02-23 ENCOUNTER — Inpatient Hospital Stay (HOSPITAL_COMMUNITY): Admission: RE | Admit: 2019-02-23 | Payer: BC Managed Care – PPO | Source: Ambulatory Visit

## 2019-02-23 ENCOUNTER — Other Ambulatory Visit: Payer: Self-pay

## 2019-02-23 DIAGNOSIS — Z20822 Contact with and (suspected) exposure to covid-19: Secondary | ICD-10-CM

## 2019-02-24 ENCOUNTER — Other Ambulatory Visit (HOSPITAL_COMMUNITY)
Admission: RE | Admit: 2019-02-24 | Discharge: 2019-02-24 | Disposition: A | Discharge status: Home / Self Care | Payer: BC Managed Care – PPO | Source: Ambulatory Visit | Attending: Internal Medicine | Admitting: Internal Medicine

## 2019-02-24 ENCOUNTER — Other Ambulatory Visit (HOSPITAL_COMMUNITY): Payer: Self-pay

## 2019-02-24 ENCOUNTER — Other Ambulatory Visit (HOSPITAL_COMMUNITY): Payer: BC Managed Care – PPO

## 2019-02-24 DIAGNOSIS — Z20828 Contact with and (suspected) exposure to other viral communicable diseases: Secondary | ICD-10-CM | POA: Diagnosis not present

## 2019-02-24 DIAGNOSIS — Z01812 Encounter for preprocedural laboratory examination: Secondary | ICD-10-CM | POA: Insufficient documentation

## 2019-02-24 LAB — SARS CORONAVIRUS 2 (TAT 6-24 HRS): SARS Coronavirus 2: NEGATIVE

## 2019-02-24 LAB — NOVEL CORONAVIRUS, NAA: SARS-CoV-2, NAA: NOT DETECTED

## 2019-02-24 MED ORDER — SILDENAFIL CITRATE 20 MG PO TABS: 20.0000 mg | ORAL_TABLET | Freq: Three times a day (TID) | ORAL | 3 refills | Status: DC

## 2019-02-25 ENCOUNTER — Ambulatory Visit (HOSPITAL_COMMUNITY)
Admission: RE | Admit: 2019-02-25 | Discharge: 2019-02-25 | Disposition: A | Discharge status: Home / Self Care | Payer: BC Managed Care – PPO | Source: Ambulatory Visit | Attending: Internal Medicine | Admitting: Internal Medicine

## 2019-02-25 ENCOUNTER — Other Ambulatory Visit: Payer: Self-pay

## 2019-02-25 ENCOUNTER — Other Ambulatory Visit (HOSPITAL_COMMUNITY): Payer: Self-pay

## 2019-02-25 DIAGNOSIS — I6523 Occlusion and stenosis of bilateral carotid arteries: Secondary | ICD-10-CM | POA: Diagnosis present

## 2019-02-25 DIAGNOSIS — R05 Cough: Secondary | ICD-10-CM | POA: Insufficient documentation

## 2019-02-25 DIAGNOSIS — R059 Cough, unspecified: Secondary | ICD-10-CM

## 2019-02-25 LAB — PULMONARY FUNCTION TEST
DL/VA % pred: 95 %
DL/VA: 3.97 ml/min/mmHg/L
DLCO cor % pred: 77 %
DLCO cor: 15.39 ml/min/mmHg
DLCO unc % pred: 56 %
DLCO unc: 11.27 ml/min/mmHg
FEF 25-75 Post: 3.89 L/sec
FEF 25-75 Pre: 2.97 L/sec
FEF2575-%Change-Post: 30 %
FEF2575-%Pred-Post: 202 %
FEF2575-%Pred-Pre: 154 %
FEV1-%Change-Post: 5 %
FEV1-%Pred-Post: 118 %
FEV1-%Pred-Pre: 112 %
FEV1-Post: 2.37 L
FEV1-Pre: 2.24 L
FEV1FVC-%Change-Post: 6 %
FEV1FVC-%Pred-Pre: 108 %
FEV6-%Change-Post: 1 %
FEV6-%Pred-Post: 106 %
FEV6-%Pred-Pre: 104 %
FEV6-Post: 2.61 L
FEV6-Pre: 2.56 L
FEV6FVC-%Pred-Post: 103 %
FEV6FVC-%Pred-Pre: 103 %
FVC-%Change-Post: 0 %
FVC-%Pred-Post: 102 %
FVC-%Pred-Pre: 103 %
FVC-Post: 2.61 L
FVC-Pre: 2.63 L
Post FEV1/FVC ratio: 91 %
Post FEV6/FVC ratio: 100 %
Pre FEV1/FVC ratio: 85 %
Pre FEV6/FVC Ratio: 100 %
RV % pred: 73 %
RV: 1.53 L
TLC % pred: 86 %
TLC: 4.38 L

## 2019-02-25 MED ORDER — SILDENAFIL CITRATE 20 MG PO TABS: 20.0000 mg | ORAL_TABLET | Freq: Three times a day (TID) | ORAL | 3 refills | Status: DC

## 2019-02-25 MED ORDER — ALBUTEROL SULFATE (2.5 MG/3ML) 0.083% IN NEBU
2.5000 mg | INHALATION_SOLUTION | Freq: Once | RESPIRATORY_TRACT | Status: AC
  Administered 2019-02-25: 2.5 mg via RESPIRATORY_TRACT

## 2019-02-25 NOTE — Telephone Encounter (Signed)
New script sent for sildenafil as previous script was sent to local pharmacy. Pt utilizes specialty pharmacy

## 2019-03-02 ENCOUNTER — Inpatient Hospital Stay: Payer: BC Managed Care – PPO | Attending: Oncology

## 2019-03-02 ENCOUNTER — Inpatient Hospital Stay: Payer: BC Managed Care – PPO

## 2019-03-02 ENCOUNTER — Other Ambulatory Visit: Payer: Self-pay

## 2019-03-02 DIAGNOSIS — N189 Chronic kidney disease, unspecified: Secondary | ICD-10-CM | POA: Diagnosis not present

## 2019-03-02 DIAGNOSIS — D631 Anemia in chronic kidney disease: Secondary | ICD-10-CM | POA: Diagnosis not present

## 2019-03-02 DIAGNOSIS — D469 Myelodysplastic syndrome, unspecified: Secondary | ICD-10-CM | POA: Insufficient documentation

## 2019-03-02 DIAGNOSIS — Z95828 Presence of other vascular implants and grafts: Secondary | ICD-10-CM

## 2019-03-02 LAB — CBC WITH DIFFERENTIAL (CANCER CENTER ONLY)
Abs Immature Granulocytes: 0.03 10*3/uL (ref 0.00–0.07)
Basophils Absolute: 0 10*3/uL (ref 0.0–0.1)
Basophils Relative: 0 %
Eosinophils Absolute: 0.1 10*3/uL (ref 0.0–0.5)
Eosinophils Relative: 1 %
HCT: 21 % — ABNORMAL LOW (ref 36.0–46.0)
Hemoglobin: 6.9 g/dL — CL (ref 12.0–15.0)
Immature Granulocytes: 1 %
Lymphocytes Relative: 10 %
Lymphs Abs: 0.6 10*3/uL — ABNORMAL LOW (ref 0.7–4.0)
MCH: 29.7 pg (ref 26.0–34.0)
MCHC: 32.9 g/dL (ref 30.0–36.0)
MCV: 90.5 fL (ref 80.0–100.0)
Monocytes Absolute: 0.9 10*3/uL (ref 0.1–1.0)
Monocytes Relative: 14 %
Neutro Abs: 4.6 10*3/uL (ref 1.7–7.7)
Neutrophils Relative %: 74 %
Platelet Count: 170 10*3/uL (ref 150–400)
RBC: 2.32 MIL/uL — ABNORMAL LOW (ref 3.87–5.11)
RDW: 18.9 % — ABNORMAL HIGH (ref 11.5–15.5)
WBC Count: 6.1 10*3/uL (ref 4.0–10.5)
nRBC: 0 % (ref 0.0–0.2)

## 2019-03-02 LAB — SAMPLE TO BLOOD BANK

## 2019-03-02 LAB — PREPARE RBC (CROSSMATCH)

## 2019-03-02 MED ORDER — FUROSEMIDE 10 MG/ML IJ SOLN
20.0000 mg | Freq: Once | INTRAMUSCULAR | Status: DC
  Administered 2019-03-02: 20 mg via INTRAVENOUS

## 2019-03-02 MED ORDER — DIPHENHYDRAMINE HCL 25 MG PO CAPS
25.0000 mg | ORAL_CAPSULE | Freq: Once | ORAL | Status: AC
  Administered 2019-03-02: 25 mg via ORAL

## 2019-03-02 MED ORDER — ACETAMINOPHEN 325 MG PO TABS
650.0000 mg | ORAL_TABLET | Freq: Once | ORAL | Status: AC
  Administered 2019-03-02: 650 mg via ORAL

## 2019-03-02 MED ORDER — HEPARIN SOD (PORK) LOCK FLUSH 100 UNIT/ML IV SOLN
500.0000 [IU] | Freq: Every day | INTRAVENOUS | Status: DC | PRN
  Filled 2019-03-02: qty 5

## 2019-03-02 MED ORDER — SODIUM CHLORIDE 0.9% IV SOLUTION
250.0000 mL | Freq: Once | INTRAVENOUS | Status: AC
  Administered 2019-03-02: 250 mL via INTRAVENOUS
  Filled 2019-03-02: qty 250

## 2019-03-02 MED ORDER — SODIUM CHLORIDE 0.9% FLUSH
10.0000 mL | INTRAVENOUS | Status: DC | PRN
  Filled 2019-03-02: qty 10

## 2019-03-02 MED ORDER — DIPHENHYDRAMINE HCL 25 MG PO CAPS
ORAL_CAPSULE | ORAL | Status: AC
  Filled 2019-03-02: qty 1

## 2019-03-02 MED ORDER — ACETAMINOPHEN 325 MG PO TABS
ORAL_TABLET | ORAL | Status: AC
  Filled 2019-03-02: qty 2

## 2019-03-02 MED ORDER — FUROSEMIDE 10 MG/ML IJ SOLN
INTRAMUSCULAR | Status: AC
  Filled 2019-03-02: qty 2

## 2019-03-02 NOTE — Patient Instructions (Signed)
Blood Transfusion, Adult, Care After This sheet gives you information about how to care for yourself after your procedure. Your health care provider may also give you more specific instructions. If you have problems or questions, contact your health care provider. What can I expect after the procedure? After your procedure, it is common to have:  Bruising and soreness where the IV tube was inserted.  Headache. Follow these instructions at home:   Take over-the-counter and prescription medicines only as told by your health care provider.  Return to your normal activities as told by your health care provider.  Follow instructions from your health care provider about how to take care of your IV insertion site. Make sure you: ? Wash your hands with soap and water before you change your bandage (dressing). If soap and water are not available, use hand sanitizer. ? Change your dressing as told by your health care provider.  Check your IV insertion site every day for signs of infection. Check for: ? More redness, swelling, or pain. ? More fluid or blood. ? Warmth. ? Pus or a bad smell. Contact a health care provider if:  You have more redness, swelling, or pain around the IV insertion site.  You have more fluid or blood coming from the IV insertion site.  Your IV insertion site feels warm to the touch.  You have pus or a bad smell coming from the IV insertion site.  Your urine turns pink, red, or brown.  You feel weak after doing your normal activities. Get help right away if:  You have signs of a serious allergic or immune system reaction, including: ? Itchiness. ? Hives. ? Trouble breathing. ? Anxiety. ? Chest or lower back pain. ? Fever, flushing, and chills. ? Rapid pulse. ? Rash. ? Diarrhea. ? Vomiting. ? Dark urine. ? Serious headache. ? Dizziness. ? Stiff neck. ? Yellow coloration of the face or the white parts of the eyes (jaundice). This information is not  intended to replace advice given to you by your health care provider. Make sure you discuss any questions you have with your health care provider. Document Released: 07/22/2014 Document Revised: 04/28/2017 Document Reviewed: 01/15/2016 Elsevier Patient Education  2020 Elsevier Inc.  

## 2019-03-02 NOTE — Progress Notes (Signed)
Dr. Alen Blew made aware of hemoglobin of 6.9. Per Dr. Alen Blew, orders placed for one unit PRBC's. Infusion nurse Bethena Roys, RN made aware.

## 2019-03-03 ENCOUNTER — Telehealth: Payer: Self-pay

## 2019-03-03 LAB — TYPE AND SCREEN
ABO/RH(D): A NEG
Antibody Screen: NEGATIVE
Unit division: 0

## 2019-03-03 LAB — BPAM RBC
Blood Product Expiration Date: 202008282359
ISSUE DATE / TIME: 202008181051
Unit Type and Rh: 600

## 2019-03-03 NOTE — Telephone Encounter (Signed)
I called pt to see how she was doing we seen husband is in the hospital she stated she was doing ok and that her husband tested positive for covid. She wanted to know if she needed to be tested again after being tested 3 times. Pt stated she was tested twice last week and it came back negative. We advised pt to stay quarantined for 2 weeks and if she develops any symptoms she is to give [place] a call or if she has SOB she is to go to the hospital. Lonia Mad

## 2019-03-11 ENCOUNTER — Encounter: Payer: Self-pay | Admitting: Nurse Practitioner

## 2019-03-11 ENCOUNTER — Other Ambulatory Visit: Payer: Self-pay

## 2019-03-11 ENCOUNTER — Telehealth (INDEPENDENT_AMBULATORY_CARE_PROVIDER_SITE_OTHER): Payer: BC Managed Care – PPO | Admitting: Nurse Practitioner

## 2019-03-11 ENCOUNTER — Ambulatory Visit: Payer: BC Managed Care – PPO | Admitting: Nurse Practitioner

## 2019-03-11 VITALS — Ht 64.0 in | Wt 180.0 lb

## 2019-03-11 DIAGNOSIS — E119 Type 2 diabetes mellitus without complications: Secondary | ICD-10-CM

## 2019-03-11 DIAGNOSIS — Z23 Encounter for immunization: Secondary | ICD-10-CM

## 2019-03-11 DIAGNOSIS — I10 Essential (primary) hypertension: Secondary | ICD-10-CM | POA: Diagnosis not present

## 2019-03-11 DIAGNOSIS — E039 Hypothyroidism, unspecified: Secondary | ICD-10-CM | POA: Diagnosis not present

## 2019-03-11 DIAGNOSIS — Z1159 Encounter for screening for other viral diseases: Secondary | ICD-10-CM

## 2019-03-11 NOTE — Progress Notes (Signed)
Virtual Visit via Telephone   This visit type was conducted due to national recommendations for restrictions regarding the COVID-19 Pandemic (e.g. social distancing) in an effort to limit this patient's exposure and mitigate transmission in our community.  Due to her co-morbid illnesses, this patient is at least at moderate risk for complications without adequate follow up.  This format is felt to be most appropriate for this patient at this time.  All issues noted in this document were discussed and addressed.  A limited physical exam was performed with this format.    This visit type was conducted due to national recommendations for restrictions regarding the COVID-19 Pandemic (e.g. social distancing) in an effort to limit this patient's exposure and mitigate transmission in our community.  Patients identity confirmed using two different identifiers.  This format is felt to be most appropriate for this patient at this time.  All issues noted in this document were discussed and addressed.  No physical exam was performed (except for noted visual exam findings with Video Visits).    Date:  03/12/2019   ID:  Patricia Mata, DOB Jun 15, 1956, MRN [id number]  Patient Location:  Home - spoke with Teressa Senter  Provider location:   Office    Chief Complaint:  hypertension and diabetes follow up  History of Present Illness:    Patricia Mata is a 63 y.o. female who presents via video conferencing for a telehealth visit today.    The patient does not have symptoms concerning for COVID-19 infection (fever, chills, cough, or new shortness of breath).   She is to have her port taken out on September 1. She going to see the Rheumatologist on the 4th.     Hypertension This is a chronic problem. The current episode started more than 1 year ago. The problem is controlled. Pertinent negatives include no anxiety. There are no associated agents to hypertension. Risk factors for coronary artery disease  include diabetes mellitus, obesity and sedentary lifestyle. Past treatments include beta blockers, diuretics, calcium channel blockers and angiotensin blockers. There are no compliance problems.  There is no history of angina. There is no history of chronic renal disease.  Diabetes She presents for her follow-up diabetic visit. She has type 2 diabetes mellitus. Her disease course has been stable. There are no diabetic associated symptoms.     Past Medical History:  Diagnosis Date  . Acute kidney injury (nontraumatic) ([place])   . Acute renal failure (ARF) ([place]) 06/08/2018  . Cancer [facility]) 2008   Colon   . Diabetes mellitus without complication ([place])   . Gout 09/08/2018  . Hypertension   . Macrocytic anemia    Past Surgical History:  Procedure Laterality Date  . COLON SURGERY    . DEBRIDMENT OF DECUBITUS ULCER N/A 06/12/2018   Procedure: DEBRIDMENT OF DECUBITUS ULCER;  Surgeon: Wallace Going, DO;  Location: [facility];  Service: Plastics;  Laterality: N/A;  . IR FLUORO GUIDE PORT INSERTION RIGHT  04/02/2017  . IR US GUIDE VASC ACCESS RIGHT  04/02/2017  . RIGHT/LEFT HEART CATH AND CORONARY ANGIOGRAPHY N/A 05/21/2018   Procedure: RIGHT/LEFT HEART CATH AND CORONARY ANGIOGRAPHY;  Surgeon: Nelva Bush, MD;  Location: [facility];  Service: Cardiovascular;  Laterality: N/A;     Current Meds  Medication Sig  . amiodarone (PACERONE) 200 MG tablet TAKE 1 TABLET BY MOUTH EVERY DAY  . amLODipine (NORVASC) 10 MG tablet Take 1 tablet (10 mg total) by mouth daily.  Marland Kitchen atorvastatin (LIPITOR) 40  MG tablet TAKE 1 TABLET BY MOUTH EVERY DAY  . blood glucose meter kit and supplies Dispense based on patient and insurance preference. Use up to four times daily as directed. (FOR ICD-10 E10.9, E11.9).  . colchicine 0.6 MG tablet TAKE 1 TABLET BY MOUTH TWICE A DAY AS NEEDED FOR GOUT  . collagenase (SANTYL) ointment Apply topically daily.  . feeding supplement, GLUCERNA SHAKE, (GLUCERNA SHAKE) LIQD  Take 237 mLs by mouth 3 (three) times daily between meals.  . fexofenadine (ALLEGRA ALLERGY) 180 MG tablet Take 1 tablet (180 mg total) by mouth daily.  . furosemide (LASIX) 40 MG tablet Take 1 tablet (40 mg total) by mouth daily.  Marland Kitchen gabapentin (NEURONTIN) 300 MG capsule Take 1 capsule (300 mg total) by mouth at bedtime.  Marland Kitchen glucose blood test strip Use as instructed  . hydrALAZINE (APRESOLINE) 100 MG tablet Take 1 tablet (100 mg total) by mouth 3 (three) times daily.  . Insulin Pen Needle 29G X 5MM MISC Use as directed  . JANUMET XR 50-500 MG TB24 TAKE 1 TABLET BY MOUTH EVERY DAY WITH EVENING MEAL,SWALLOW WHOLE. DO NOT CRUSH, CHEW AND/OR DIVIDE  . ketorolac (ACULAR) 0.5 % ophthalmic solution One drop in OS TID  . latanoprost (XALATAN) 0.005 % ophthalmic solution Place 1 drop into both eyes at bedtime.   Marland Kitchen levothyroxine (SYNTHROID) 50 MCG tablet Take 1 tablet by mouth daily in AM  . metoprolol succinate (TOPROL-XL) 25 MG 24 hr tablet Take 1 tablet (25 mg total) by mouth daily.  . Multiple Vitamins-Minerals (CENTRUM SILVER 50+WOMEN) TABS Take 1 tablet by mouth daily.  Marland Kitchen ofloxacin (OCUFLOX) 0.3 % ophthalmic solution Instill 1 drop TID in operative eye starting 2 days prior to surgery and after surgery for 3 weeks.  . pantoprazole (PROTONIX) 40 MG tablet Take 1 tablet (40 mg total) by mouth daily.  . polyethylene glycol (MIRALAX / GLYCOLAX) packet Take 17 g by mouth 2 (two) times daily.  . prednisoLONE acetate (PRED FORTE) 1 % ophthalmic suspension One drop in operative eye(s) TID starting 2 days before surgery and after surgery for 3 weeks  . senna (SENOKOT) 8.6 MG TABS tablet Take 2 tablets (17.2 mg total) by mouth at bedtime.  . sildenafil (REVATIO) 20 MG tablet Take 1 tablet (20 mg total) by mouth 3 (three) times daily.  Marland Kitchen spironolactone (ALDACTONE) 25 MG tablet Take 0.5 tablets (12.5 mg total) by mouth daily.  . traMADol (ULTRAM) 50 MG tablet Take 1 tablet (50 mg total) by mouth daily.  Marland Kitchen  triamcinolone (NASACORT) 55 MCG/ACT AERO nasal inhaler Place 1 spray into the nose 2 (two) times daily.  . [DISCONTINUED] hydrALAZINE (APRESOLINE) 25 MG tablet      Allergies:   Ancef [cefazolin] and Sulfa antibiotics   Social History   Tobacco Use  . Smoking status: Former Smoker    Packs/day: 0.25    Years: 20.00    Pack years: 5.00    Types: Cigarettes    Quit date: 01/31/2001    Years since quitting: 18.1  . Smokeless tobacco: Former Network engineer Use Topics  . Alcohol use: Yes    Alcohol/week: 2.0 standard drinks    Types: 2 Shots of liquor per week  . Drug use: Never     Family Hx: The patient's family history includes Cervical cancer in her mother; Diabetes in her mother; HIV/AIDS in her brother; Heart attack in her father; Hypertension in her brother, mother, sister, sister, and sister; Prostate cancer in her brother.  ROS:   Please see the history of present illness.    Review of Systems  Constitutional: Negative.   Respiratory: Negative.   Cardiovascular: Negative.   Neurological: Negative.   Psychiatric/Behavioral: Negative.     All other systems reviewed and are negative.   Labs/Other Tests and Data Reviewed:    Recent Labs: 05/13/2018: B Natriuretic Peptide 3,571.3 06/14/2018: Magnesium 2.0 01/18/2019: TSH 50.110 02/09/2019: ALT 8; BUN 73; Creatinine 2.73; Potassium 4.7; Sodium 136 03/02/2019: Hemoglobin 6.9; Platelet Count 170   Recent Lipid Panel No results found for: CHOL, TRIG, HDL, CHOLHDL, LDLCALC, LDLDIRECT  Wt Readings from Last 3 Encounters:  03/11/19 180 lb (81.6 kg)  02/15/19 186 lb (84.4 kg)  02/09/19 186 lb 14.4 oz (84.8 kg)     Exam:    Vital Signs:  Ht '5\' 4"'  (1.626 m)   Wt 180 lb (81.6 kg)   BMI 30.90 kg/m     Physical Exam  Constitutional: She is oriented to person, place, and time. No distress.  Neurological: She is alert and oriented to person, place, and time. GCS score is 15.  Psychiatric: Mood, memory, affect and  judgment normal.  Unable to visualize due to telephone visit  ASSESSMENT & PLAN:    1. Essential hypertension . No blood pressure done today due to telephone visit, due to exposure to COVID 19 with her husband.   . CMP ordered to check renal function to be done at her visit to Rheumatology/Oncology  - CMP14 + Anion Gap; Future  2. Hypothyroidism, unspecified type  Chronic, not well controlled, will recheck thyroid levels. She is taking 50 mcg daily.   Continue with current medications - TSH; Future - T3; Future - T4, Free; Future - CMP14 + Anion Gap; Future  3. Type 2 diabetes mellitus without complication, without long-term current use of insulin ([facility])  Chronic, stable.  Continue with current medications, no insulin at this time  Continue to limit intake of sugary foods and drinks  - Hemoglobin A1c; Future - CMP14 + Anion Gap; Future  4. Need for influenza vaccination  She will come next week for her Influenza vaccine not given today  5. Need for pneumococcal vaccination  She will come next week for her Pneumonia vaccine due to her decreased immune system and chronic health problems  6. Encounter for hepatitis C screening test for low risk patient  Will check for Hepatitis C screening due to being born between the years 59-1966 - Hepatitis C antibody; Future   COVID-19 Education: The signs and symptoms of COVID-19 were discussed with the patient and how to seek care for testing (follow up with PCP or arrange E-visit).  The importance of social distancing was discussed today.  Patient Risk:   After full review of this patients clinical status, I feel that they are at least moderate risk at this time.  Time:   Today, I have spent 16 minutes/ seconds with the patient with telehealth technology discussing above diagnoses.     Medication Adjustments/Labs and Tests Ordered: Current medicines are reviewed at length with the patient today.  Concerns regarding  medicines are outlined above.   Tests Ordered: Orders Placed This Encounter  Procedures  . Hepatitis C antibody  . Hemoglobin A1c  . TSH  . T3  . T4, Free  . CMP14 + Anion Gap    Medication Changes: No orders of the defined types were placed in this encounter.   Disposition:  Follow up in 3 month(s)  Signed,  [name], FNP

## 2019-03-12 ENCOUNTER — Encounter: Payer: Self-pay | Admitting: Nurse Practitioner

## 2019-03-14 ENCOUNTER — Other Ambulatory Visit: Payer: Self-pay | Admitting: Radiology

## 2019-03-15 ENCOUNTER — Telehealth: Payer: Self-pay | Admitting: Oncology

## 2019-03-15 ENCOUNTER — Other Ambulatory Visit: Payer: Self-pay | Admitting: Physician Assistant

## 2019-03-15 NOTE — Telephone Encounter (Signed)
Called patient regarding infusion log, informed patient appointments on 09/08 times have been changed. Patient is notified.

## 2019-03-16 ENCOUNTER — Encounter (HOSPITAL_COMMUNITY): Payer: Self-pay

## 2019-03-16 ENCOUNTER — Ambulatory Visit (HOSPITAL_COMMUNITY)
Admission: RE | Admit: 2019-03-16 | Discharge: 2019-03-16 | Disposition: A | Discharge status: Home / Self Care | Payer: BC Managed Care – PPO | Source: Ambulatory Visit | Attending: Oncology | Admitting: Oncology

## 2019-03-16 ENCOUNTER — Other Ambulatory Visit: Payer: Self-pay

## 2019-03-16 DIAGNOSIS — Z452 Encounter for adjustment and management of vascular access device: Secondary | ICD-10-CM | POA: Diagnosis present

## 2019-03-16 DIAGNOSIS — Z794 Long term (current) use of insulin: Secondary | ICD-10-CM | POA: Insufficient documentation

## 2019-03-16 DIAGNOSIS — Z79890 Hormone replacement therapy (postmenopausal): Secondary | ICD-10-CM | POA: Insufficient documentation

## 2019-03-16 DIAGNOSIS — D469 Myelodysplastic syndrome, unspecified: Secondary | ICD-10-CM | POA: Diagnosis not present

## 2019-03-16 DIAGNOSIS — Z79899 Other long term (current) drug therapy: Secondary | ICD-10-CM | POA: Diagnosis not present

## 2019-03-16 DIAGNOSIS — Z85038 Personal history of other malignant neoplasm of large intestine: Secondary | ICD-10-CM | POA: Insufficient documentation

## 2019-03-16 DIAGNOSIS — E119 Type 2 diabetes mellitus without complications: Secondary | ICD-10-CM | POA: Diagnosis not present

## 2019-03-16 DIAGNOSIS — M109 Gout, unspecified: Secondary | ICD-10-CM | POA: Diagnosis not present

## 2019-03-16 DIAGNOSIS — I10 Essential (primary) hypertension: Secondary | ICD-10-CM | POA: Insufficient documentation

## 2019-03-16 DIAGNOSIS — Z95828 Presence of other vascular implants and grafts: Secondary | ICD-10-CM

## 2019-03-16 HISTORY — PX: IR REMOVAL TUN ACCESS W/ PORT W/O FL MOD SED: IMG2290

## 2019-03-16 LAB — PROTIME-INR
INR: 1.1 (ref 0.8–1.2)
Prothrombin Time: 13.9 seconds (ref 11.4–15.2)

## 2019-03-16 LAB — CBC WITH DIFFERENTIAL/PLATELET
Abs Immature Granulocytes: 0.05 10*3/uL (ref 0.00–0.07)
Basophils Absolute: 0 10*3/uL (ref 0.0–0.1)
Basophils Relative: 0 %
Eosinophils Absolute: 0.1 10*3/uL (ref 0.0–0.5)
Eosinophils Relative: 2 %
HCT: 24.2 % — ABNORMAL LOW (ref 36.0–46.0)
Hemoglobin: 7.8 g/dL — ABNORMAL LOW (ref 12.0–15.0)
Immature Granulocytes: 1 %
Lymphocytes Relative: 12 %
Lymphs Abs: 0.7 10*3/uL (ref 0.7–4.0)
MCH: 30.1 pg (ref 26.0–34.0)
MCHC: 32.2 g/dL (ref 30.0–36.0)
MCV: 93.4 fL (ref 80.0–100.0)
Monocytes Absolute: 1 10*3/uL (ref 0.1–1.0)
Monocytes Relative: 16 %
Neutro Abs: 4.3 10*3/uL (ref 1.7–7.7)
Neutrophils Relative %: 69 %
Platelets: 140 10*3/uL — ABNORMAL LOW (ref 150–400)
RBC: 2.59 MIL/uL — ABNORMAL LOW (ref 3.87–5.11)
RDW: 18.6 % — ABNORMAL HIGH (ref 11.5–15.5)
WBC: 6.2 10*3/uL (ref 4.0–10.5)
nRBC: 0 % (ref 0.0–0.2)

## 2019-03-16 LAB — BASIC METABOLIC PANEL
Anion gap: 14 (ref 5–15)
BUN: 53 mg/dL — ABNORMAL HIGH (ref 8–23)
CO2: 19 mmol/L — ABNORMAL LOW (ref 22–32)
Calcium: 9.6 mg/dL (ref 8.9–10.3)
Chloride: 106 mmol/L (ref 98–111)
Creatinine, Ser: 2.04 mg/dL — ABNORMAL HIGH (ref 0.44–1.00)
GFR calc Af Amer: 29 mL/min — ABNORMAL LOW (ref >60)
GFR calc non Af Amer: 25 mL/min — ABNORMAL LOW (ref >60)
Glucose, Bld: 134 mg/dL — ABNORMAL HIGH (ref 70–99)
Potassium: 4.4 mmol/L (ref 3.5–5.1)
Sodium: 139 mmol/L (ref 135–145)

## 2019-03-16 LAB — GLUCOSE, CAPILLARY: Glucose-Capillary: 143 mg/dL — ABNORMAL HIGH (ref 70–99)

## 2019-03-16 MED ORDER — MIDAZOLAM HCL 2 MG/2ML IJ SOLN
INTRAMUSCULAR | Status: AC | PRN
  Administered 2019-03-16 (×2): 1 mg via INTRAVENOUS

## 2019-03-16 MED ORDER — FENTANYL CITRATE (PF) 100 MCG/2ML IJ SOLN
INTRAMUSCULAR | Status: AC
  Filled 2019-03-16: qty 2

## 2019-03-16 MED ORDER — CLINDAMYCIN PHOSPHATE 900 MG/50ML IV SOLN
900.0000 mg | Freq: Once | INTRAVENOUS | Status: AC
  Administered 2019-03-16: 13:00:00 900 mg via INTRAVENOUS

## 2019-03-16 MED ORDER — CLINDAMYCIN PHOSPHATE 900 MG/50ML IV SOLN
INTRAVENOUS | Status: AC
  Administered 2019-03-16: 900 mg via INTRAVENOUS
  Filled 2019-03-16: qty 50

## 2019-03-16 MED ORDER — SODIUM CHLORIDE 0.9 % IV SOLN
INTRAVENOUS | Status: DC
  Administered 2019-03-16: 11:00:00 via INTRAVENOUS

## 2019-03-16 MED ORDER — LIDOCAINE-EPINEPHRINE (PF) 1 %-1:200000 IJ SOLN
INTRAMUSCULAR | Status: AC | PRN
  Administered 2019-03-16: 10 mL

## 2019-03-16 MED ORDER — LIDOCAINE-EPINEPHRINE 1 %-1:100000 IJ SOLN
INTRAMUSCULAR | Status: AC
  Filled 2019-03-16: qty 1

## 2019-03-16 MED ORDER — FENTANYL CITRATE (PF) 100 MCG/2ML IJ SOLN
INTRAMUSCULAR | Status: AC | PRN
  Administered 2019-03-16: 50 ug via INTRAVENOUS

## 2019-03-16 MED ORDER — MIDAZOLAM HCL 2 MG/2ML IJ SOLN
INTRAMUSCULAR | Status: AC
  Filled 2019-03-16: qty 4

## 2019-03-16 NOTE — Discharge Instructions (Signed)
Moderate Conscious Sedation, Adult, Care After °These instructions provide you with information about caring for yourself after your procedure. Your health care provider may also give you more specific instructions. Your treatment has been planned according to current medical practices, but problems sometimes occur. Call your health care provider if you have any problems or questions after your procedure. °What can I expect after the procedure? °After your procedure, it is common: °· To feel sleepy for several hours. °· To feel clumsy and have poor balance for several hours. °· To have poor judgment for several hours. °· To vomit if you eat too soon. °Follow these instructions at home: °For at least 24 hours after the procedure: ° °· Do not: °? Participate in activities where you could fall or become injured. °? Drive. °? Use heavy machinery. °? Drink alcohol. °? Take sleeping pills or medicines that cause drowsiness. °? Make important decisions or sign legal documents. °? Take care of children on your own. °· Rest. °Eating and drinking °· Follow the diet recommended by your health care provider. °· If you vomit: °? Drink water, juice, or soup when you can drink without vomiting. °? Make sure you have little or no nausea before eating solid foods. °General instructions °· Have a responsible adult stay with you until you are awake and alert. °· Take over-the-counter and prescription medicines only as told by your health care provider. °· If you smoke, do not smoke without supervision. °· Keep all follow-up visits as told by your health care provider. This is important. °Contact a health care provider if: °· You keep feeling nauseous or you keep vomiting. °· You feel light-headed. °· You develop a rash. °· You have a fever. °Get help right away if: °· You have trouble breathing. °This information is not intended to replace advice given to you by your health care provider. Make sure you discuss any questions you have  with your health care provider. °Document Released: 04/21/2013 Document Revised: 06/13/2017 Document Reviewed: 10/21/2015 °Elsevier Patient Education © 2020 [employer] °Implanted Port Removal, Care After °This sheet gives you information about how to care for yourself after your procedure. Your health care provider may also give you more specific instructions. If you have problems or questions, contact your health care provider. °What can I expect after the procedure? °After the procedure, it is common to have: °· Soreness or pain near your incision. °· Some swelling or bruising near your incision. °Follow these instructions at home: °Medicines °· Take over-the-counter and prescription medicines only as told by your health care provider. °· If you were prescribed an antibiotic medicine, take it as told by your health care provider. Do not stop taking the antibiotic even if you start to feel better. °Bathing °· Do not take baths, swim, or use a hot tub until your health care provider approves. Ask your health care provider if you can take showers. You may only be allowed to take sponge baths. °Incision care ° °· Follow instructions from your health care provider about how to take care of your incision. Make sure you: °? Wash your hands with soap and water before you change your bandage (dressing). If soap and water are not available, use hand sanitizer. °? Change your dressing as told by your health care provider. °? Keep your dressing dry. °? Leave stitches (sutures), skin glue, or adhesive strips in place. These skin closures may need to stay in place for 2 weeks or longer. If adhesive strip edges   start to loosen and curl up, you may trim the loose edges. Do not remove adhesive strips completely unless your health care provider tells you to do that. °· Check your incision area every day for signs of infection. Check for: °? More redness, swelling, or pain. °? More fluid or blood. °? Warmth. °? Pus or a bad  smell. °Driving ° °· Do not drive for 24 hours if you were given a medicine to help you relax (sedative) during your procedure. °· If you did not receive a sedative, ask your health care provider when it is safe to drive. °Activity °· Return to your normal activities as told by your health care provider. Ask your health care provider what activities are safe for you. °· Do not lift anything that is heavier than 10 lb (4.5 kg), or the limit that you are told, until your health care provider says that it is safe. °· Do not do activities that involve lifting your arms over your head. °General instructions °· Do not use any products that contain nicotine or tobacco, such as cigarettes and e-cigarettes. These can delay healing. If you need help quitting, ask your health care provider. °· Keep all follow-up visits as told by your health care provider. This is important. °Contact a health care provider if: °· You have more redness, swelling, or pain around your incision. °· You have more fluid or blood coming from your incision. °· Your incision feels warm to the touch. °· You have pus or a bad smell coming from your incision. °· You have pain that is not relieved by your pain medicine. °Get help right away if you have: °· A fever or chills. °· Chest pain. °· Difficulty breathing. °Summary °· After the procedure, it is common to have pain, soreness, swelling, or bruising near your incision. °· If you were prescribed an antibiotic medicine, take it as told by your health care provider. Do not stop taking the antibiotic even if you start to feel better. °· Do not drive for 24 hours if you were given a sedative during your procedure. °· Return to your normal activities as told by your health care provider. Ask your health care provider what activities are safe for you. °This information is not intended to replace advice given to you by your health care provider. Make sure you discuss any questions you have with your health  care provider. °Document Released: 06/12/2015 Document Revised: 08/14/2017 Document Reviewed: 08/14/2017 °Elsevier Patient Education © 2020 [employer] ° °

## 2019-03-16 NOTE — Procedures (Signed)
Interventional Radiology Procedure Note  Procedure: Removal of right chest portacatheter.  Complications: None  Estimated Blood Loss: None  Recommendations: DC home  Signed,  Shell Yandow K. Caraline Deutschman, MD    

## 2019-03-16 NOTE — H&P (Signed)
Referring Physician(s): [name]  Supervising Physician: Jacqulynn Cadet  Patient Status:  [facility] OP  Chief Complaint:  "I'm getting my port out"  Subjective: Patient familiar to IR service from bone marrow biopsies in 2017 and 2018 as well as Port-A-Cath placement in 2018.  She has a history of colon cancer in 2008, currently in remission as well as MDS diagnosed in 2017.  She is no longer using her Port-A-Cath and presents today for Port-A-Cath removal.  She currently denies fever, headache, chest pain, dyspnea, cough, abdominal/back pain, nausea, vomiting or bleeding.  Additional medical history as listed below.  Past Medical History:  Diagnosis Date  . Acute kidney injury (nontraumatic) ([place])   . Acute renal failure (ARF) ([place]) 06/08/2018  . Cancer [facility]) 2008   Colon   . Diabetes mellitus without complication ([place])   . Gout 09/08/2018  . Hypertension   . Macrocytic anemia    Past Surgical History:  Procedure Laterality Date  . COLON SURGERY    . DEBRIDMENT OF DECUBITUS ULCER N/A 06/12/2018   Procedure: DEBRIDMENT OF DECUBITUS ULCER;  Surgeon: Wallace Going, DO;  Location: [facility];  Service: Plastics;  Laterality: N/A;  . IR FLUORO GUIDE PORT INSERTION RIGHT  04/02/2017  . IR US GUIDE VASC ACCESS RIGHT  04/02/2017  . RIGHT/LEFT HEART CATH AND CORONARY ANGIOGRAPHY N/A 05/21/2018   Procedure: RIGHT/LEFT HEART CATH AND CORONARY ANGIOGRAPHY;  Surgeon: Nelva Bush, MD;  Location: [place] [facility];  Service: Cardiovascular;  Laterality: N/A;      Allergies: Ancef [cefazolin] and Sulfa antibiotics  Medications: Prior to Admission medications   Medication Sig Start Date End Date Taking? Authorizing Provider  amiodarone (PACERONE) 200 MG tablet TAKE 1 TABLET BY MOUTH EVERY DAY 12/02/18  Yes Bensimhon, Shaune Pascal, MD  amLODipine (NORVASC) 10 MG tablet Take 1 tablet (10 mg total) by mouth daily. 02/15/19  Yes Bensimhon, Shaune Pascal, MD  atorvastatin (LIPITOR) 40 MG  tablet TAKE 1 TABLET BY MOUTH EVERY DAY 11/30/18  Yes [name], FNP  colchicine 0.6 MG tablet TAKE 1 TABLET BY MOUTH TWICE A DAY AS NEEDED FOR GOUT 01/11/19  Yes [name], FNP  furosemide (LASIX) 40 MG tablet Take 1 tablet (40 mg total) by mouth daily. 11/04/18  Yes [name], FNP  gabapentin (NEURONTIN) 300 MG capsule Take 1 capsule (300 mg total) by mouth at bedtime. 02/16/19  Yes Stover, Titorya, DPM  hydrALAZINE (APRESOLINE) 100 MG tablet Take 1 tablet (100 mg total) by mouth 3 (three) times daily. 11/20/18  Yes Lorretta Harp, MD  JANUMET XR 50-500 MG TB24 TAKE 1 TABLET BY MOUTH EVERY DAY WITH EVENING MEAL,SWALLOW WHOLE. DO NOT CRUSH, CHEW AND/OR DIVIDE 09/08/18  Yes [name], FNP  ketorolac (ACULAR) 0.5 % ophthalmic solution One drop in OS TID 12/10/18  Yes [provider]  latanoprost (XALATAN) 0.005 % ophthalmic solution Place 1 drop into both eyes at bedtime.    Yes [provider]  levothyroxine (SYNTHROID) 50 MCG tablet Take 1 tablet by mouth daily in AM 01/19/19  Yes [name], FNP  metoprolol succinate (TOPROL-XL) 25 MG 24 hr tablet Take 1 tablet (25 mg total) by mouth daily. 11/02/18  Yes Clegg, Amy D, NP  Multiple Vitamins-Minerals (CENTRUM SILVER 50+WOMEN) TABS Take 1 tablet by mouth daily.   Yes [provider]  polyethylene glycol (MIRALAX / GLYCOLAX) packet Take 17 g by mouth 2 (two) times daily. 06/15/18  Yes Bonnielee Haff, MD  sildenafil (REVATIO) 20  MG tablet Take 1 tablet (20 mg total) by mouth 3 (three) times daily. 02/25/19  Yes Clegg, Amy D, NP  spironolactone (ALDACTONE) 25 MG tablet Take 0.5 tablets (12.5 mg total) by mouth daily. 11/04/18  Yes [name], FNP  blood glucose meter kit and supplies Dispense based on patient and insurance preference. Use up to four times daily as directed. (FOR ICD-10 E10.9, E11.9). 11/04/18   [name], FNP  collagenase (SANTYL) ointment Apply topically daily. 05/27/18   Georgiana Shore, NP   feeding supplement, GLUCERNA SHAKE, (GLUCERNA SHAKE) LIQD Take 237 mLs by mouth 3 (three) times daily between meals. 05/26/18   Georgiana Shore, NP  fexofenadine [facility] ALLERGY) 180 MG tablet Take 1 tablet (180 mg total) by mouth daily. 01/18/19   [name], FNP  glucose blood test strip Use as instructed 10/19/18   [name], FNP  Insulin Pen Needle 29G X 5MM MISC Use as directed 06/16/17   Bonnielee Haff, MD  ofloxacin (OCUFLOX) 0.3 % ophthalmic solution Instill 1 drop TID in operative eye starting 2 days prior to surgery and after surgery for 3 weeks. 09/14/18   [provider]  pantoprazole (PROTONIX) 40 MG tablet Take 1 tablet (40 mg total) by mouth daily. 05/27/18   Georgiana Shore, NP  potassium chloride SA (K-DUR,KLOR-CON) 20 MEQ tablet Take 1 tablet (20 mEq total) by mouth once for 1 dose. 08/04/18 08/04/18  Tillery, Satira Mccallum, PA-C  prednisoLONE acetate (PRED FORTE) 1 % ophthalmic suspension One drop in operative eye(s) TID starting 2 days before surgery and after surgery for 3 weeks 09/14/18   [provider]  senna (SENOKOT) 8.6 MG TABS tablet Take 2 tablets (17.2 mg total) by mouth at bedtime. 06/15/18   Bonnielee Haff, MD  traMADol (ULTRAM) 50 MG tablet Take 1 tablet (50 mg total) by mouth daily. 06/15/18   Bonnielee Haff, MD  triamcinolone (NASACORT) 55 MCG/ACT AERO nasal inhaler Place 1 spray into the nose 2 (two) times daily. 01/18/19 01/18/20  [name], FNP     Vital Signs: BP (!) 176/65 (BP Location: Right Arm)   Pulse (!) 59   Temp 98.4 F (36.9 C) (Oral)   Resp 18   SpO2 100%   Physical Exam awake, alert.  Chest clear to auscultation bilaterally.  Clean, intact right chest wall Port-A-Cath.  Heart with regular rate and rhythm, positive murmur.  Abdomen soft, positive bowel sounds, nontender.  No lower extremity edema.  Imaging: No results found.  Labs:  CBC: Recent Labs    01/27/19 1355 02/09/19 1016 03/02/19 0857 03/16/19 1017  WBC  6.1 6.1 6.1 6.2  HGB 8.6* 7.1* 6.9* 7.8*  HCT 25.8* 21.8* 21.0* 24.2*  PLT 165 151 170 140*    COAGS: Recent Labs    05/11/18 0650  05/16/18 0331 05/17/18 0345 05/18/18 0418 05/19/18 0336 03/16/19 1125  INR 1.78  --   --  1.20  --   --  1.1  APTT  --    < > 176* 35 33 150*  --    < > = values in this interval not displayed.    BMP: Recent Labs    01/22/19 1222 01/27/19 1355 02/09/19 1016 03/16/19 1017  NA 136 135 136 139  K 4.3 4.3 4.7 4.4  CL 102 100 103 106  CO2 '22 27 25 ' 19*  GLUCOSE 181* 163* 204* 134*  BUN 81* 72* 73* 53*  CALCIUM 9.6 10.1 9.5 9.6  CREATININE 2.72* 2.24* 2.73*  2.04*  GFRNONAA 18* 22* 18* 25*  GFRAA 21* 26* 20* 29*    LIVER FUNCTION TESTS: Recent Labs    12/08/18 1258 12/29/18 1309 01/22/19 1222 02/09/19 1016  BILITOT 0.6 0.5 0.5 0.5  AST '20 24 25 20  ' ALT '11 11 11 8  ' ALKPHOS 73 77 88 81  PROT 7.6 8.0 7.6 7.2  ALBUMIN 3.9 4.1 3.9 3.7    Assessment and Plan:  Pt with history of colon cancer in 2008, currently in remission as well as MDS diagnosed in 2017.  She is no longer using her Port-A-Cath and presents today for Port-A-Cath removal.  Details/risks of procedure, including but not limited to, internal bleeding, infection, injury to adjacent structures discussed with patient with her understanding and consent.   Electronically Signed: D. Rowe Robert, PA-C 03/16/2019, 12:06 PM   I spent a total of 20 minutes at the the patient's bedside AND on the patient's hospital floor or unit, greater than 50% of which was counseling/coordinating care for Port-A-Cath removal

## 2019-03-17 ENCOUNTER — Other Ambulatory Visit: Payer: Self-pay

## 2019-03-17 DIAGNOSIS — Z20822 Contact with and (suspected) exposure to covid-19: Secondary | ICD-10-CM

## 2019-03-18 ENCOUNTER — Inpatient Hospital Stay (HOSPITAL_COMMUNITY)
Admission: EM | Admit: 2019-03-18 | Discharge: 2019-03-23 | DRG: 177 | Disposition: A | Discharge status: Home / Self Care | Payer: BC Managed Care – PPO | Attending: Family Medicine | Admitting: Family Medicine

## 2019-03-18 ENCOUNTER — Other Ambulatory Visit: Payer: Self-pay

## 2019-03-18 ENCOUNTER — Emergency Department (HOSPITAL_COMMUNITY): Payer: BC Managed Care – PPO

## 2019-03-18 ENCOUNTER — Encounter (HOSPITAL_COMMUNITY): Payer: Self-pay

## 2019-03-18 DIAGNOSIS — I350 Nonrheumatic aortic (valve) stenosis: Secondary | ICD-10-CM | POA: Diagnosis present

## 2019-03-18 DIAGNOSIS — I6523 Occlusion and stenosis of bilateral carotid arteries: Secondary | ICD-10-CM | POA: Diagnosis not present

## 2019-03-18 DIAGNOSIS — E783 Hyperchylomicronemia: Secondary | ICD-10-CM | POA: Diagnosis not present

## 2019-03-18 DIAGNOSIS — T380X5A Adverse effect of glucocorticoids and synthetic analogues, initial encounter: Secondary | ICD-10-CM | POA: Diagnosis not present

## 2019-03-18 DIAGNOSIS — I10 Essential (primary) hypertension: Secondary | ICD-10-CM

## 2019-03-18 DIAGNOSIS — Z87891 Personal history of nicotine dependence: Secondary | ICD-10-CM

## 2019-03-18 DIAGNOSIS — Z85038 Personal history of other malignant neoplasm of large intestine: Secondary | ICD-10-CM | POA: Diagnosis not present

## 2019-03-18 DIAGNOSIS — Z794 Long term (current) use of insulin: Secondary | ICD-10-CM

## 2019-03-18 DIAGNOSIS — I272 Pulmonary hypertension, unspecified: Secondary | ICD-10-CM | POA: Diagnosis present

## 2019-03-18 DIAGNOSIS — N185 Chronic kidney disease, stage 5: Secondary | ICD-10-CM | POA: Diagnosis not present

## 2019-03-18 DIAGNOSIS — E875 Hyperkalemia: Secondary | ICD-10-CM | POA: Diagnosis not present

## 2019-03-18 DIAGNOSIS — I130 Hypertensive heart and chronic kidney disease with heart failure and stage 1 through stage 4 chronic kidney disease, or unspecified chronic kidney disease: Secondary | ICD-10-CM | POA: Diagnosis present

## 2019-03-18 DIAGNOSIS — J302 Other seasonal allergic rhinitis: Secondary | ICD-10-CM | POA: Diagnosis present

## 2019-03-18 DIAGNOSIS — D469 Myelodysplastic syndrome, unspecified: Secondary | ICD-10-CM | POA: Diagnosis present

## 2019-03-18 DIAGNOSIS — E119 Type 2 diabetes mellitus without complications: Secondary | ICD-10-CM | POA: Diagnosis not present

## 2019-03-18 DIAGNOSIS — E669 Obesity, unspecified: Secondary | ICD-10-CM | POA: Diagnosis present

## 2019-03-18 DIAGNOSIS — D631 Anemia in chronic kidney disease: Secondary | ICD-10-CM | POA: Diagnosis present

## 2019-03-18 DIAGNOSIS — Z9049 Acquired absence of other specified parts of digestive tract: Secondary | ICD-10-CM

## 2019-03-18 DIAGNOSIS — I480 Paroxysmal atrial fibrillation: Secondary | ICD-10-CM | POA: Diagnosis present

## 2019-03-18 DIAGNOSIS — E785 Hyperlipidemia, unspecified: Secondary | ICD-10-CM | POA: Diagnosis present

## 2019-03-18 DIAGNOSIS — E1165 Type 2 diabetes mellitus with hyperglycemia: Secondary | ICD-10-CM | POA: Diagnosis present

## 2019-03-18 DIAGNOSIS — Z79891 Long term (current) use of opiate analgesic: Secondary | ICD-10-CM

## 2019-03-18 DIAGNOSIS — M109 Gout, unspecified: Secondary | ICD-10-CM | POA: Diagnosis present

## 2019-03-18 DIAGNOSIS — Z8249 Family history of ischemic heart disease and other diseases of the circulatory system: Secondary | ICD-10-CM

## 2019-03-18 DIAGNOSIS — D696 Thrombocytopenia, unspecified: Secondary | ICD-10-CM | POA: Diagnosis present

## 2019-03-18 DIAGNOSIS — D509 Iron deficiency anemia, unspecified: Secondary | ICD-10-CM | POA: Diagnosis present

## 2019-03-18 DIAGNOSIS — E039 Hypothyroidism, unspecified: Secondary | ICD-10-CM | POA: Diagnosis present

## 2019-03-18 DIAGNOSIS — Z79890 Hormone replacement therapy (postmenopausal): Secondary | ICD-10-CM

## 2019-03-18 DIAGNOSIS — J9601 Acute respiratory failure with hypoxia: Secondary | ICD-10-CM

## 2019-03-18 DIAGNOSIS — Z833 Family history of diabetes mellitus: Secondary | ICD-10-CM

## 2019-03-18 DIAGNOSIS — R001 Bradycardia, unspecified: Secondary | ICD-10-CM | POA: Diagnosis not present

## 2019-03-18 DIAGNOSIS — J1282 Pneumonia due to coronavirus disease 2019: Secondary | ICD-10-CM | POA: Diagnosis present

## 2019-03-18 DIAGNOSIS — N184 Chronic kidney disease, stage 4 (severe): Secondary | ICD-10-CM | POA: Diagnosis present

## 2019-03-18 DIAGNOSIS — E038 Other specified hypothyroidism: Secondary | ICD-10-CM | POA: Diagnosis not present

## 2019-03-18 DIAGNOSIS — U071 COVID-19: Principal | ICD-10-CM

## 2019-03-18 DIAGNOSIS — I4891 Unspecified atrial fibrillation: Secondary | ICD-10-CM | POA: Diagnosis not present

## 2019-03-18 DIAGNOSIS — N189 Chronic kidney disease, unspecified: Secondary | ICD-10-CM | POA: Diagnosis present

## 2019-03-18 DIAGNOSIS — Z881 Allergy status to other antibiotic agents status: Secondary | ICD-10-CM

## 2019-03-18 DIAGNOSIS — I5032 Chronic diastolic (congestive) heart failure: Secondary | ICD-10-CM | POA: Diagnosis present

## 2019-03-18 DIAGNOSIS — Z6831 Body mass index (BMI) 31.0-31.9, adult: Secondary | ICD-10-CM

## 2019-03-18 DIAGNOSIS — E1122 Type 2 diabetes mellitus with diabetic chronic kidney disease: Secondary | ICD-10-CM | POA: Diagnosis present

## 2019-03-18 DIAGNOSIS — I779 Disorder of arteries and arterioles, unspecified: Secondary | ICD-10-CM | POA: Diagnosis present

## 2019-03-18 DIAGNOSIS — Z79899 Other long term (current) drug therapy: Secondary | ICD-10-CM

## 2019-03-18 DIAGNOSIS — N183 Chronic kidney disease, stage 3 (moderate): Secondary | ICD-10-CM | POA: Diagnosis not present

## 2019-03-18 DIAGNOSIS — D638 Anemia in other chronic diseases classified elsewhere: Secondary | ICD-10-CM | POA: Diagnosis not present

## 2019-03-18 DIAGNOSIS — Z9221 Personal history of antineoplastic chemotherapy: Secondary | ICD-10-CM

## 2019-03-18 DIAGNOSIS — D619 Aplastic anemia, unspecified: Secondary | ICD-10-CM | POA: Diagnosis present

## 2019-03-18 DIAGNOSIS — J1289 Other viral pneumonia: Secondary | ICD-10-CM

## 2019-03-18 DIAGNOSIS — Z882 Allergy status to sulfonamides status: Secondary | ICD-10-CM

## 2019-03-18 DIAGNOSIS — E7800 Pure hypercholesterolemia, unspecified: Secondary | ICD-10-CM | POA: Diagnosis not present

## 2019-03-18 DIAGNOSIS — Z7951 Long term (current) use of inhaled steroids: Secondary | ICD-10-CM

## 2019-03-18 LAB — FIBRINOGEN: Fibrinogen: 499 mg/dL — ABNORMAL HIGH (ref 210–475)

## 2019-03-18 LAB — CBC WITH DIFFERENTIAL/PLATELET
Abs Immature Granulocytes: 0.07 10*3/uL (ref 0.00–0.07)
Basophils Absolute: 0 10*3/uL (ref 0.0–0.1)
Basophils Relative: 0 %
Eosinophils Absolute: 0.1 10*3/uL (ref 0.0–0.5)
Eosinophils Relative: 1 %
HCT: 23.7 % — ABNORMAL LOW (ref 36.0–46.0)
Hemoglobin: 7.7 g/dL — ABNORMAL LOW (ref 12.0–15.0)
Immature Granulocytes: 1 %
Lymphocytes Relative: 5 %
Lymphs Abs: 0.5 10*3/uL — ABNORMAL LOW (ref 0.7–4.0)
MCH: 30 pg (ref 26.0–34.0)
MCHC: 32.5 g/dL (ref 30.0–36.0)
MCV: 92.2 fL (ref 80.0–100.0)
Monocytes Absolute: 0.7 10*3/uL (ref 0.1–1.0)
Monocytes Relative: 8 %
Neutro Abs: 7.6 10*3/uL (ref 1.7–7.7)
Neutrophils Relative %: 85 %
Platelets: 146 10*3/uL — ABNORMAL LOW (ref 150–400)
RBC: 2.57 MIL/uL — ABNORMAL LOW (ref 3.87–5.11)
RDW: 18.6 % — ABNORMAL HIGH (ref 11.5–15.5)
WBC: 8.9 10*3/uL (ref 4.0–10.5)
nRBC: 0 % (ref 0.0–0.2)

## 2019-03-18 LAB — LACTIC ACID, PLASMA
Lactic Acid, Venous: 1 mmol/L (ref 0.5–1.9)
Lactic Acid, Venous: 0.9 mmol/L (ref 0.5–1.9)

## 2019-03-18 LAB — COMPREHENSIVE METABOLIC PANEL
ALT: 18 U/L (ref 0–44)
AST: 32 U/L (ref 15–41)
Albumin: 4.2 g/dL (ref 3.5–5.0)
Alkaline Phosphatase: 96 U/L (ref 38–126)
Anion gap: 13 (ref 5–15)
BUN: 57 mg/dL — ABNORMAL HIGH (ref 8–23)
CO2: 20 mmol/L — ABNORMAL LOW (ref 22–32)
Calcium: 9.5 mg/dL (ref 8.9–10.3)
Chloride: 104 mmol/L (ref 98–111)
Creatinine, Ser: 2.16 mg/dL — ABNORMAL HIGH (ref 0.44–1.00)
GFR calc Af Amer: 27 mL/min — ABNORMAL LOW (ref >60)
GFR calc non Af Amer: 23 mL/min — ABNORMAL LOW (ref >60)
Glucose, Bld: 172 mg/dL — ABNORMAL HIGH (ref 70–99)
Potassium: 4 mmol/L (ref 3.5–5.1)
Sodium: 137 mmol/L (ref 135–145)
Total Bilirubin: 1 mg/dL (ref 0.3–1.2)
Total Protein: 8.3 g/dL — ABNORMAL HIGH (ref 6.5–8.1)

## 2019-03-18 LAB — URINALYSIS, ROUTINE W REFLEX MICROSCOPIC
Bacteria, UA: NONE SEEN
Bilirubin Urine: NEGATIVE
Glucose, UA: NEGATIVE mg/dL
Hgb urine dipstick: NEGATIVE
Ketones, ur: NEGATIVE mg/dL
Leukocytes,Ua: NEGATIVE
Nitrite: NEGATIVE
Protein, ur: 100 mg/dL — AB
Specific Gravity, Urine: 1.009 (ref 1.005–1.030)
pH: 5 (ref 5.0–8.0)

## 2019-03-18 LAB — D-DIMER, QUANTITATIVE: D-Dimer, Quant: 1.35 ug/mL-FEU — ABNORMAL HIGH (ref 0.00–0.50)

## 2019-03-18 LAB — TRIGLYCERIDES: Triglycerides: 70 mg/dL (ref <150)

## 2019-03-18 LAB — PROCALCITONIN: Procalcitonin: 0.18 ng/mL

## 2019-03-18 LAB — FERRITIN: Ferritin: 2115 ng/mL — ABNORMAL HIGH (ref 11–307)

## 2019-03-18 LAB — SARS CORONAVIRUS 2 BY RT PCR (HOSPITAL ORDER, PERFORMED IN ~~LOC~~ HOSPITAL LAB): SARS Coronavirus 2: POSITIVE — AB

## 2019-03-18 LAB — C-REACTIVE PROTEIN: CRP: 1.9 mg/dL — ABNORMAL HIGH (ref <1.0)

## 2019-03-18 LAB — LACTATE DEHYDROGENASE: LDH: 211 U/L — ABNORMAL HIGH (ref 98–192)

## 2019-03-18 MED ORDER — AMIODARONE HCL 100 MG PO TABS: 200.0000 mg | ORAL_TABLET | Freq: Every day | ORAL | Status: DC

## 2019-03-18 MED ORDER — ZOLPIDEM TARTRATE 5 MG PO TABS: 5.0000 mg | ORAL_TABLET | Freq: Every evening | ORAL | Status: DC | PRN

## 2019-03-18 MED ORDER — METOPROLOL SUCCINATE ER 25 MG PO TB24: 25.0000 mg | ORAL_TABLET | Freq: Every day | ORAL | Status: DC

## 2019-03-18 MED ORDER — AMLODIPINE BESYLATE 5 MG PO TABS
10.0000 mg | ORAL_TABLET | Freq: Every day | ORAL | Status: DC
  Administered 2019-03-19 – 2019-03-23 (×5): 10 mg via ORAL
  Filled 2019-03-18 (×5): qty 2

## 2019-03-18 MED ORDER — DEXAMETHASONE SODIUM PHOSPHATE 10 MG/ML IJ SOLN
6.0000 mg | INTRAMUSCULAR | Status: DC
  Administered 2019-03-18 – 2019-03-22 (×5): 6 mg via INTRAVENOUS
  Filled 2019-03-18 (×5): qty 1

## 2019-03-18 MED ORDER — LEVOTHYROXINE SODIUM 50 MCG PO TABS
50.0000 ug | ORAL_TABLET | Freq: Every day | ORAL | Status: DC
  Administered 2019-03-19 – 2019-03-21 (×3): 50 ug via ORAL
  Filled 2019-03-18 (×3): qty 1

## 2019-03-18 MED ORDER — SODIUM CHLORIDE 0.9 % IV SOLN
100.0000 mg | INTRAVENOUS | Status: AC
  Administered 2019-03-19 – 2019-03-22 (×4): 100 mg via INTRAVENOUS
  Filled 2019-03-18 (×4): qty 20

## 2019-03-18 MED ORDER — AMIODARONE HCL 100 MG PO TABS
200.0000 mg | ORAL_TABLET | Freq: Every day | ORAL | Status: DC
  Administered 2019-03-19 – 2019-03-20 (×2): 200 mg via ORAL
  Filled 2019-03-18 (×2): qty 2

## 2019-03-18 MED ORDER — ALBUTEROL SULFATE HFA 108 (90 BASE) MCG/ACT IN AERS
2.0000 | INHALATION_SPRAY | Freq: Four times a day (QID) | RESPIRATORY_TRACT | Status: DC
  Administered 2019-03-18 – 2019-03-19 (×2): 2 via RESPIRATORY_TRACT
  Filled 2019-03-18: qty 6.7

## 2019-03-18 MED ORDER — DOCUSATE SODIUM 100 MG PO CAPS
100.0000 mg | ORAL_CAPSULE | Freq: Two times a day (BID) | ORAL | Status: DC
  Administered 2019-03-18 – 2019-03-23 (×10): 100 mg via ORAL
  Filled 2019-03-18 (×10): qty 1

## 2019-03-18 MED ORDER — ACETAMINOPHEN 325 MG PO TABS: 650.0000 mg | ORAL_TABLET | Freq: Four times a day (QID) | ORAL | Status: DC | PRN

## 2019-03-18 MED ORDER — SILDENAFIL CITRATE 20 MG PO TABS
20.0000 mg | ORAL_TABLET | Freq: Three times a day (TID) | ORAL | Status: DC
  Administered 2019-03-18 – 2019-03-23 (×13): 20 mg via ORAL
  Filled 2019-03-18 (×19): qty 1

## 2019-03-18 MED ORDER — AMLODIPINE BESYLATE 5 MG PO TABS: 10.0000 mg | ORAL_TABLET | Freq: Every day | ORAL | Status: DC

## 2019-03-18 MED ORDER — HYDROCOD POLST-CPM POLST ER 10-8 MG/5ML PO SUER: 5.0000 mL | Freq: Two times a day (BID) | ORAL | Status: DC | PRN

## 2019-03-18 MED ORDER — HYDRALAZINE HCL 50 MG PO TABS
100.0000 mg | ORAL_TABLET | Freq: Three times a day (TID) | ORAL | Status: DC
  Administered 2019-03-18 – 2019-03-23 (×14): 100 mg via ORAL
  Filled 2019-03-18 (×14): qty 2

## 2019-03-18 MED ORDER — GUAIFENESIN-DM 100-10 MG/5ML PO SYRP: 10.0000 mL | ORAL_SOLUTION | ORAL | Status: DC | PRN

## 2019-03-18 MED ORDER — ZINC SULFATE 220 (50 ZN) MG PO CAPS
220.0000 mg | ORAL_CAPSULE | Freq: Every day | ORAL | Status: DC
  Administered 2019-03-18 – 2019-03-23 (×6): 220 mg via ORAL
  Filled 2019-03-18 (×6): qty 1

## 2019-03-18 MED ORDER — KETOROLAC TROMETHAMINE 0.5 % OP SOLN
1.0000 [drp] | Freq: Every day | OPHTHALMIC | Status: DC
  Filled 2019-03-18: qty 3

## 2019-03-18 MED ORDER — SENNOSIDES-DOCUSATE SODIUM 8.6-50 MG PO TABS: 1.0000 | ORAL_TABLET | Freq: Every evening | ORAL | Status: DC | PRN

## 2019-03-18 MED ORDER — ONDANSETRON HCL 4 MG PO TABS: 4.0000 mg | ORAL_TABLET | Freq: Four times a day (QID) | ORAL | Status: DC | PRN

## 2019-03-18 MED ORDER — LATANOPROST 0.005 % OP SOLN
1.0000 [drp] | Freq: Every day | OPHTHALMIC | Status: DC
  Administered 2019-03-19 – 2019-03-22 (×4): 1 [drp] via OPHTHALMIC
  Filled 2019-03-18: qty 2.5

## 2019-03-18 MED ORDER — ALBUTEROL SULFATE HFA 108 (90 BASE) MCG/ACT IN AERS
2.0000 | INHALATION_SPRAY | RESPIRATORY_TRACT | Status: DC | PRN
  Filled 2019-03-18: qty 6.7

## 2019-03-18 MED ORDER — FUROSEMIDE 20 MG PO TABS: 40.0000 mg | ORAL_TABLET | Freq: Every day | ORAL | Status: DC

## 2019-03-18 MED ORDER — VITAMIN C 500 MG PO TABS
500.0000 mg | ORAL_TABLET | Freq: Every day | ORAL | Status: DC
  Administered 2019-03-18 – 2019-03-23 (×6): 500 mg via ORAL
  Filled 2019-03-18 (×6): qty 1

## 2019-03-18 MED ORDER — INSULIN ASPART 100 UNIT/ML ~~LOC~~ SOLN
0.0000 [IU] | Freq: Every day | SUBCUTANEOUS | Status: DC
  Administered 2019-03-18: 5 [IU] via SUBCUTANEOUS

## 2019-03-18 MED ORDER — ATORVASTATIN CALCIUM 40 MG PO TABS: 40.0000 mg | ORAL_TABLET | Freq: Every day | ORAL | Status: DC

## 2019-03-18 MED ORDER — METOPROLOL SUCCINATE ER 25 MG PO TB24
25.0000 mg | ORAL_TABLET | Freq: Every day | ORAL | Status: DC
  Filled 2019-03-18: qty 1

## 2019-03-18 MED ORDER — ACETAMINOPHEN 325 MG PO TABS
650.0000 mg | ORAL_TABLET | Freq: Once | ORAL | Status: AC
  Administered 2019-03-18: 650 mg via ORAL
  Filled 2019-03-18: qty 2

## 2019-03-18 MED ORDER — ONDANSETRON HCL 4 MG/2ML IJ SOLN: 4.0000 mg | Freq: Four times a day (QID) | INTRAMUSCULAR | Status: DC | PRN

## 2019-03-18 MED ORDER — HEPARIN SODIUM (PORCINE) 5000 UNIT/ML IJ SOLN
5000.0000 [IU] | Freq: Three times a day (TID) | INTRAMUSCULAR | Status: DC
  Administered 2019-03-18 – 2019-03-23 (×14): 5000 [IU] via SUBCUTANEOUS
  Filled 2019-03-18 (×14): qty 1

## 2019-03-18 MED ORDER — ATORVASTATIN CALCIUM 40 MG PO TABS
40.0000 mg | ORAL_TABLET | Freq: Every day | ORAL | Status: DC
  Administered 2019-03-19 – 2019-03-20 (×2): 40 mg via ORAL
  Filled 2019-03-18 (×2): qty 1

## 2019-03-18 MED ORDER — SODIUM CHLORIDE 0.9 % IV SOLN
200.0000 mg | Freq: Once | INTRAVENOUS | Status: AC
  Administered 2019-03-18: 200 mg via INTRAVENOUS
  Filled 2019-03-18: qty 40

## 2019-03-18 MED ORDER — INSULIN ASPART 100 UNIT/ML ~~LOC~~ SOLN
0.0000 [IU] | Freq: Three times a day (TID) | SUBCUTANEOUS | Status: DC
  Administered 2019-03-19: 3 [IU] via SUBCUTANEOUS

## 2019-03-18 NOTE — ED Triage Notes (Signed)
Pt c/o of sudden onset H/A.  Pain 5/10.  Pt did not take anything.  No hx of migraine.  Pt's son tested positive for COVID, pt tested negative 2 days ago.  Pt now have c/o of weakness, fever and chills.  Pt had her port removed 2 days ago also.

## 2019-03-18 NOTE — H&P (Signed)
History and Physical    Patricia Mata GYI:[id number] DOB: January 05, 1956 DOA: 03/18/2019  PCP: [name], FNP Patient coming from: home  Chief Complaint: Fever, body ache and headache  HPI: Patricia Mata is a 63 y.o. female with history of with history of DM-2, CKD4 severe aortic stenosis, diastolic CHF, colon cancer, HTN, A. fib, anemia of chronic disease and hypothyroidism presenting with multiple complaints including fever, chills, body ache, headache and cough.  Patient was in his usual state of health until last night when she suddenly started having headache followed by fever, chills, generalized body pain and headache.  Her husband was recently hospitalized with COVID about 3 weeks ago and discharge home.  She said she was tested for COVID yesterday but has not had a result back.  She denies chest pain, nausea, vomiting, abdominal pain, diarrhea or UTI symptoms.  She denies neck stiffness, photophobia or phonophobia.  Lives with her husband.  Denies smoking cigarettes, drinking alcohol recreational drug use.  In ED, initially desaturated to 89% but recovered to 95% on 4 L.  However, she continued to desaturate to mid 80s and finally requiring 10 L by HFNC.  Creatinine 2.16 (baseline).  BUN 57.  Glucose 172.  Hgb 7.7 (baseline).  Platelet 146.  LDH 2 111.  Ferritin 2115.  CRP 1.9.  Lactic acid negative.  Procalcitonin 0.18.  D-dimer 1.36.  Fibrinogen 499.  COVID-19 positive.  UA not impressive.  CT head without contrast without acute finding.  Portable CXR with LUL opacity concerning for pneumonia.  EKG normal sinus rhythm without acute finding.  Patient was given Tylenol and hospital service was called for admission for COVID-19 infection.  ROS All review of system negative except for pertinent positives and negatives as history of present illness above. PMH Past Medical History:  Diagnosis Date  . Acute kidney injury (nontraumatic) ([place])   . Acute renal failure (ARF) ([place]) 06/08/2018  .  Cancer [facility]) 2008   Colon   . Diabetes mellitus without complication ([place])   . Gout 09/08/2018  . Hypertension   . Macrocytic anemia    PSH Past Surgical History:  Procedure Laterality Date  . COLON SURGERY    . DEBRIDMENT OF DECUBITUS ULCER N/A 06/12/2018   Procedure: DEBRIDMENT OF DECUBITUS ULCER;  Surgeon: Wallace Going, DO;  Location: [facility];  Service: Plastics;  Laterality: N/A;  . IR FLUORO GUIDE PORT INSERTION RIGHT  04/02/2017  . IR REMOVAL TUN ACCESS W/ PORT W/O FL MOD SED  03/16/2019  . IR US GUIDE VASC ACCESS RIGHT  04/02/2017  . RIGHT/LEFT HEART CATH AND CORONARY ANGIOGRAPHY N/A 05/21/2018   Procedure: RIGHT/LEFT HEART CATH AND CORONARY ANGIOGRAPHY;  Surgeon: Nelva Bush, MD;  Location: [place] [facility];  Service: Cardiovascular;  Laterality: N/A;   Fam HX Family History  Problem Relation Age of Onset  . Hypertension Mother   . Diabetes Mother   . Cervical cancer Mother   . Heart attack Father   . Hypertension Sister   . Hypertension Brother   . Hypertension Sister   . Hypertension Sister   . Prostate cancer Brother   . HIV/AIDS Brother    Social Hx  reports that she quit smoking about 18 years ago. Her smoking use included cigarettes. She has a 5.00 pack-year smoking history. She has quit using smokeless tobacco. She reports current alcohol use of about 2.0 standard drinks of alcohol per week. She reports that she does not use drugs.  Allergy Allergies  Allergen Reactions  .  Ancef [Cefazolin] Itching    Severe itching- after procedure, ancef was the antibiotic.-04/02/17  . Sulfa Antibiotics Rash and Other (See Comments)    Blisters, also   Home Meds Prior to Admission medications   Medication Sig Start Date End Date Taking? Authorizing Provider  amiodarone (PACERONE) 200 MG tablet TAKE 1 TABLET BY MOUTH EVERY DAY Patient taking differently: Take 200 mg by mouth daily.  12/02/18  Yes Bensimhon, Shaune Pascal, MD  amLODipine (NORVASC) 10 MG tablet Take 1  tablet (10 mg total) by mouth daily. 02/15/19  Yes Bensimhon, Shaune Pascal, MD  fexofenadine [facility] ALLERGY) 180 MG tablet Take 1 tablet (180 mg total) by mouth daily. 01/18/19  Yes [name], FNP  furosemide (LASIX) 40 MG tablet Take 1 tablet (40 mg total) by mouth daily. 11/04/18  Yes [name], FNP  gabapentin (NEURONTIN) 300 MG capsule Take 1 capsule (300 mg total) by mouth at bedtime. 02/16/19  Yes Stover, Titorya, DPM  hydrALAZINE (APRESOLINE) 100 MG tablet Take 1 tablet (100 mg total) by mouth 3 (three) times daily. 11/20/18  Yes Lorretta Harp, MD  JANUMET XR 50-500 MG TB24 TAKE 1 TABLET BY MOUTH EVERY DAY WITH EVENING MEAL,SWALLOW WHOLE. DO NOT CRUSH, CHEW AND/OR DIVIDE 09/08/18  Yes [name], FNP  levothyroxine (SYNTHROID) 50 MCG tablet Take 1 tablet by mouth daily in AM 01/19/19  Yes [name], FNP  sildenafil (REVATIO) 20 MG tablet Take 1 tablet (20 mg total) by mouth 3 (three) times daily. 02/25/19  Yes Clegg, Amy D, NP  spironolactone (ALDACTONE) 25 MG tablet Take 0.5 tablets (12.5 mg total) by mouth daily. 11/04/18  Yes [name], FNP  triamcinolone (NASACORT) 55 MCG/ACT AERO nasal inhaler Place 1 spray into the nose 2 (two) times daily. 01/18/19 01/18/20 Yes [name], FNP  atorvastatin (LIPITOR) 40 MG tablet TAKE 1 TABLET BY MOUTH EVERY DAY 11/30/18   [name], FNP  blood glucose meter kit and supplies Dispense based on patient and insurance preference. Use up to four times daily as directed. (FOR ICD-10 E10.9, E11.9). 11/04/18   [name], FNP  colchicine 0.6 MG tablet TAKE 1 TABLET BY MOUTH TWICE A DAY AS NEEDED FOR GOUT 01/11/19   [name], FNP  collagenase (SANTYL) ointment Apply topically daily. 05/27/18   Georgiana Shore, NP  feeding supplement, GLUCERNA SHAKE, (GLUCERNA SHAKE) LIQD Take 237 mLs by mouth 3 (three) times daily between meals. 05/26/18   Georgiana Shore, NP  glucose blood test strip Use as instructed 10/19/18   [name], FNP  Insulin Pen  Needle 29G X 5MM MISC Use as directed 06/16/17   Bonnielee Haff, MD  ketorolac (ACULAR) 0.5 % ophthalmic solution One drop in OS TID 12/10/18   [provider]  latanoprost (XALATAN) 0.005 % ophthalmic solution Place 1 drop into both eyes at bedtime.     [provider]  metoprolol succinate (TOPROL-XL) 25 MG 24 hr tablet Take 1 tablet (25 mg total) by mouth daily. 11/02/18   Clegg, Amy D, NP  Multiple Vitamins-Minerals (CENTRUM SILVER 50+WOMEN) TABS Take 1 tablet by mouth daily.    [provider]  ofloxacin (OCUFLOX) 0.3 % ophthalmic solution Instill 1 drop TID in operative eye starting 2 days prior to surgery and after surgery for 3 weeks. 09/14/18   [provider]  pantoprazole (PROTONIX) 40 MG tablet Take 1 tablet (40 mg total) by mouth daily. 05/27/18   Georgiana Shore, NP  polyethylene glycol [facility]) packet  Take 17 g by mouth 2 (two) times daily. 06/15/18   Bonnielee Haff, MD  potassium chloride SA (K-DUR,KLOR-CON) 20 MEQ tablet Take 1 tablet (20 mEq total) by mouth once for 1 dose. 08/04/18 08/04/18  Tillery, Satira Mccallum, PA-C  prednisoLONE acetate (PRED FORTE) 1 % ophthalmic suspension One drop in operative eye(s) TID starting 2 days before surgery and after surgery for 3 weeks 09/14/18   [provider]  senna (SENOKOT) 8.6 MG TABS tablet Take 2 tablets (17.2 mg total) by mouth at bedtime. 06/15/18   Bonnielee Haff, MD  traMADol (ULTRAM) 50 MG tablet Take 1 tablet (50 mg total) by mouth daily. Patient not taking: Reported on 03/18/2019 06/15/18   Bonnielee Haff, MD    Physical Exam: Vitals:   03/18/19 0751 03/18/19 0753 03/18/19 1319  BP: (!) 167/44  (!) 174/45  Pulse: 62  65  Resp: 16  20  Temp: 100.3 F (37.9 C)    TempSrc: Oral    SpO2: 90%  90%  Weight:  81.6 kg   Height:  '5\' 4"'  (1.626 m)     GENERAL: No acute distress.  Appears well.  HEENT: MMM.  Vision and hearing grossly intact.  NECK: Supple.  No apparent JVD.   RESP: Desaturating to mid 80s on 4 L.  Mild IWOB. Fair air movement bilaterally. CVS:  RRR.  2/6 SEM over RUSB. Heart sounds normal.  ABD/GI/GU: Bowel sounds present. Soft. Non tender.  MSK/EXT:  Moves extremities. No apparent deformity or edema.  SKIN: no apparent skin lesion or wound NEURO: Awake, alert and oriented appropriately.  No gross deficit.  PSYCH: Calm. Normal affect.   Personally Reviewed Radiological Exams Ct Head Wo Contrast  Result Date: 03/18/2019 CLINICAL DATA:  Headache. EXAM: CT HEAD WITHOUT CONTRAST TECHNIQUE: Contiguous axial images were obtained from the base of the skull through the vertex without intravenous contrast. COMPARISON:  CT scan of May 20, 2018. FINDINGS: Brain: Mild chronic ischemic white matter disease is noted. No mass effect or midline shift is noted. Ventricular size is within normal limits. There is no evidence of mass lesion, hemorrhage or acute infarction. Vascular: No hyperdense vessel or unexpected calcification. Skull: Normal. Negative for fracture or focal lesion. Sinuses/Orbits: No acute finding. Other: None. IMPRESSION: Mild chronic ischemic white matter disease. No acute intracranial abnormality seen. Electronically Signed   By: Marijo Conception M.D.   On: 03/18/2019 08:45   Dg Chest Port 1 View  Result Date: 03/18/2019 CLINICAL DATA:  63 year old female with sudden onset of headache, migraines, and weakness. EXAM: PORTABLE CHEST 1 VIEW COMPARISON:  Chest radiograph dated 02/15/2019 FINDINGS: There has been interval removal of the Port-A-Cath. Bilateral perihilar vascular prominence likely mild vascular congestion. An area of increased density in the left upper lung field/left suprahilar region may represent developing infiltrate. No pleural effusion or pneumothorax. Stable cardiomegaly. Atherosclerotic calcification of the aortic arch. No acute osseous pathology. IMPRESSION: Left upper lung field opacity. Electronically Signed   By: Anner Crete  M.D.   On: 03/18/2019 09:26     Personally Reviewed Labs: CBC: Recent Labs  Lab 03/16/19 1017 03/18/19 0847  WBC 6.2 8.9  NEUTROABS 4.3 7.6  HGB 7.8* 7.7*  HCT 24.2* 23.7*  MCV 93.4 92.2  PLT 140* 253*   Basic Metabolic Panel: Recent Labs  Lab 03/16/19 1017 03/18/19 0847  NA 139 137  K 4.4 4.0  CL 106 104  CO2 19* 20*  GLUCOSE 134* 172*  BUN 53* 57*  CREATININE  2.04* 2.16*  CALCIUM 9.6 9.5   GFR: Estimated Creatinine Clearance: 27.2 mL/min (A) (by C-G formula based on SCr of 2.16 mg/dL (H)). Liver Function Tests: Recent Labs  Lab 03/18/19 0847  AST 32  ALT 18  ALKPHOS 96  BILITOT 1.0  PROT 8.3*  ALBUMIN 4.2   No results for input(s): LIPASE, AMYLASE in the last 168 hours. No results for input(s): AMMONIA in the last 168 hours. Coagulation Profile: Recent Labs  Lab 03/16/19 1125  INR 1.1   Cardiac Enzymes: No results for input(s): CKTOTAL, CKMB, CKMBINDEX, TROPONINI in the last 168 hours. BNP (last 3 results) No results for input(s): PROBNP in the last 8760 hours. HbA1C: No results for input(s): HGBA1C in the last 72 hours. CBG: Recent Labs  Lab 03/16/19 1016  GLUCAP 143*   Lipid Profile: Recent Labs    03/18/19 0928  TRIG 70   Thyroid Function Tests: No results for input(s): TSH, T4TOTAL, FREET4, T3FREE, THYROIDAB in the last 72 hours. Anemia Panel: Recent Labs    03/18/19 0928  FERRITIN 2,115*   Urine analysis:    Component Value Date/Time   COLORURINE YELLOW 03/18/2019 1337   APPEARANCEUR CLEAR 03/18/2019 1337   LABSPEC 1.009 03/18/2019 1337   PHURINE 5.0 03/18/2019 1337   GLUCOSEU NEGATIVE 03/18/2019 1337   HGBUR NEGATIVE 03/18/2019 1337   BILIRUBINUR NEGATIVE 03/18/2019 1337   KETONESUR NEGATIVE 03/18/2019 1337   PROTEINUR 100 (A) 03/18/2019 1337   NITRITE NEGATIVE 03/18/2019 1337   LEUKOCYTESUR NEGATIVE 03/18/2019 1337    Sepsis Labs:  Lactic acid within normal range Procalcitonin 0.18.  Personally Reviewed EKG:   EKG normal sinus rhythm without acute ischemic finding.  Assessment/Plan Acute respiratory failure with hypoxia due to COVID-19 pneumonia: patient with flulike symptoms.  Hypoxemic requiring 2 L by high flow nasal cannula to maintain saturation in 90s.  She is full code. -Admit to progressive unit -COVID-19 test positive.  CXR with LUL opacity. -Start dexamethasone and Remdesivir.  She has CKD4 but benefit outweighs the risk at this point. -Orally consented on Actemra. -Albuterol inhaler, vitamin C and zinc -Daily inflammatory labs. -Discussed with Dr. Candiss Norse over the phone  CKD-4/azotemia: Stable -Continue monitoring  Chronic diastolic CHF/pulmonary hypertension: Stable. No signs of fluid overload. -Resume home medications after med rec -Closely monitor fluid status -Resume home meds after med rec.  Severe aortic stenosis: -Need follow-up with cardiology and structural heart disease on this.  Hypertension: Normotensive. -Resume home meds after med rec.  Anemia of chronic disease: Hgb stable.  Stable -Continue monitoring  Paroxysmal A. Fib: Since she is on amiodarone and metoprolol but not on anticoagulation.  Chads 2 vasc score greater than 3. -Continue home medications after med rec. -Needs risk and benefit discussion on anticoagulation unless there is clear contraindication.  Hypothyroidism: TSH 50 on 7/6. -Recheck TSH -Resume home Synthroid after med rec.  Controlled DM-2: A1c 5.8% on 12/09/2018 -CBG monitoring and sliding scale insulin -Continue home gabapentin after med rec.  Thrombocytopenia: At baseline. -Continue monitoring  DVT prophylaxis: Subcu heparin. Code Status: Full code. Family Communication: Updated patient's husband over the phone.  Disposition Plan: Admit to progressive care unit at [facility] Consults called: None Admission status: Inpatient   Mercy Riding MD [facility]  If 7PM-7AM, please contact night-coverage [email or web address] Password  [email or web address]  03/18/2019, 2:24 PM

## 2019-03-18 NOTE — ED Notes (Signed)
Pt satting 87% on 6 L [place].  Pt placed on 10 L non-rebreather, pt satting 93%on non-rebreather.

## 2019-03-18 NOTE — ED Notes (Signed)
Pt satting 89-91% RA.  Pt placed on 3 L [place], pt's sats 97% RA.

## 2019-03-18 NOTE — ED Notes (Signed)
[facility] arrived  

## 2019-03-18 NOTE — ED Notes (Signed)
Pure wick has been placed. Suction set to 45mmHg.  

## 2019-03-18 NOTE — ED Provider Notes (Signed)
[facility]-EMERGENCY DEPT Provider Note   CSN: [id number] Arrival date & time: 03/18/19  3016     History   Chief Complaint No chief complaint on file.   HPI Quentella Mcguffie is a 63 y.o. female with history of diabetes mellitus, hypertension, gout, A. fib, iron deficiency anemia, CKD presents for evaluation of acute onset, progressively worsening flulike symptoms since yesterday.  She notes that her husband tested positive for COVID-19 infection around 3 weeks ago, spent a week in the hospital, and then they quarantined at home for 14 days.  She reports she got a call from the health department to get retested which she did yesterday.  Yesterday afternoon/evening she developed myalgias, fever up to 100.4 F, and throbbing frontal headache.  She denies any neck stiffness, vision changes, numbness, weakness, or paresthesias of the extremities.  She denies any shortness of breath, chest pain, abdominal pain, nausea, or vomiting.  She does have a cough productive of clear-yellow sputum.  She is a non-smoker.  Denies recent travel.  Also note, her Port-A-Cath was removed 2 days ago.     The history is provided by the patient.    Past Medical History:  Diagnosis Date  . Acute kidney injury (nontraumatic) (HCC)   . Acute renal failure (ARF) (HCC) 06/08/2018  . Cancer [facility]) 2008   Colon   . Diabetes mellitus without complication (HCC)   . Gout 09/08/2018  . Hypertension   . Macrocytic anemia     Patient Active Problem List   Diagnosis Date Noted  . Type 2 diabetes mellitus without complication, without long-term current use of insulin (HCC) 12/10/2018  . Hypothyroidism 12/10/2018  . Seasonal allergies 12/10/2018  . Gout 09/08/2018  . Acute osteomyelitis of pelvic region and thigh (HCC)   . 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 Decubitus ulcer of coccygeal region, stage 4 (HCC)   . Sacral wound 06/08/2018  . Iron deficiency anemia due to chronic blood  loss 06/08/2018  . Hyponatremia 06/08/2018  . Constipation 06/08/2018  . Palliative care encounter   . Pressure injury of skin 05/22/2018  . Acute hypoxemic respiratory failure (HCC)   . Cardiogenic shock (HCC)   . Atrial fibrillation with rapid ventricular response (HCC) 05/11/2018  . Symptomatic anemia 06/14/2017  . Severe aortic stenosis 06/10/2017  . Carotid artery disease (HCC) 06/10/2017  . Essential hypertension 05/13/2017  . Hyperlipidemia 05/13/2017  . Bilateral lower extremity edema 05/13/2017  . Port-A-Cath in place 04/28/2017  . MDS (myelodysplastic syndrome) (HCC) 03/20/2017  . Goals of care, counseling/discussion 03/20/2017  . Anemia in chronic kidney disease 05/10/2016  . Anemia in stage 1 chronic kidney disease 05/10/2016  . Anemia 02/09/2016  . 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 COLON SURGERY    . DEBRIDMENT OF DECUBITUS ULCER N/A 06/12/2018   Procedure: DEBRIDMENT OF DECUBITUS ULCER;  Surgeon: Peggye Form, DO;  Location: [facility] ORS;  Service: Plastics;  Laterality: N/A;  . IR FLUORO GUIDE PORT INSERTION RIGHT  04/02/2017  . IR REMOVAL TUN ACCESS W/ PORT W/O FL MOD SED  03/16/2019  . IR US GUIDE VASC ACCESS RIGHT  04/02/2017  . RIGHT/LEFT HEART CATH AND CORONARY ANGIOGRAPHY N/A 05/21/2018   Procedure: RIGHT/LEFT HEART CATH AND CORONARY ANGIOGRAPHY;  Surgeon: Yvonne Kendall, MD;  Location: [facility] INVASIVE CV LAB;  Service: Cardiovascular;  Laterality: N/A;     OB History  No obstetric history on file.      Home Medications    Prior to Admission medications   Medication Sig Start Date End Date Taking? Authorizing Provider  amiodarone (PACERONE) 200 MG tablet TAKE 1 TABLET BY MOUTH EVERY DAY 12/02/18   Bensimhon, Bevelyn Buckles, MD  amLODipine (NORVASC) 10 MG tablet Take 1 tablet (10 mg total) by mouth daily. 02/15/19   Bensimhon, Bevelyn Buckles, MD  atorvastatin  (LIPITOR) 40 MG tablet TAKE 1 TABLET BY MOUTH EVERY DAY 11/30/18   Arnette Felts, FNP  blood glucose meter kit and supplies Dispense based on patient and insurance preference. Use up to four times daily as directed. (FOR ICD-10 E10.9, E11.9). 11/04/18   Arnette Felts, FNP  colchicine 0.6 MG tablet TAKE 1 TABLET BY MOUTH TWICE A DAY AS NEEDED FOR GOUT 01/11/19   Arnette Felts, FNP  collagenase (SANTYL) ointment Apply topically daily. 05/27/18   Alford Highland, NP  feeding supplement, GLUCERNA SHAKE, (GLUCERNA SHAKE) LIQD Take 237 mLs by mouth 3 (three) times daily between meals. 05/26/18   Alford Highland, NP  fexofenadine [facility] ALLERGY) 180 MG tablet Take 1 tablet (180 mg total) by mouth daily. 01/18/19   Arnette Felts, FNP  furosemide (LASIX) 40 MG tablet Take 1 tablet (40 mg total) by mouth daily. 11/04/18   Arnette Felts, FNP  gabapentin (NEURONTIN) 300 MG capsule Take 1 capsule (300 mg total) by mouth at bedtime. 02/16/19   Asencion Islam, DPM  glucose blood test strip Use as instructed 10/19/18   Arnette Felts, FNP  hydrALAZINE (APRESOLINE) 100 MG tablet Take 1 tablet (100 mg total) by mouth 3 (three) times daily. 11/20/18   Runell Gess, MD  Insulin Pen Needle 29G X MISC Use as directed 06/16/17   Osvaldo Shipper, MD  JANUMET XR 50-500 MG TB24 TAKE 1 TABLET BY MOUTH EVERY DAY WITH EVENING MEAL,SWALLOW WHOLE. DO NOT CRUSH, CHEW AND/OR DIVIDE 09/08/18   Arnette Felts, FNP  ketorolac (ACULAR) 0.5 % ophthalmic solution One drop in OS TID 12/10/18   [provider]  latanoprost (XALATAN) 0.005 % ophthalmic solution Place 1 drop into both eyes at bedtime.     [provider]  levothyroxine (SYNTHROID) 50 MCG tablet Take 1 tablet by mouth daily in AM 01/19/19   Arnette Felts, FNP  metoprolol succinate (TOPROL-XL) 25 MG 24 hr tablet Take 1 tablet (25 mg total) by mouth daily. 11/02/18   Clegg, Amy D, NP  Multiple Vitamins-Minerals (CENTRUM SILVER 50+WOMEN) TABS Take 1 tablet by mouth  daily.    [provider]  ofloxacin (OCUFLOX) 0.3 % ophthalmic solution Instill 1 drop TID in operative eye starting 2 days prior to surgery and after surgery for 3 weeks. 09/14/18   [provider]  pantoprazole (PROTONIX) 40 MG tablet Take 1 tablet (40 mg total) by mouth daily. 05/27/18   Alford Highland, NP  polyethylene glycol [facility] Ethelene Hal) packet Take 17 g by mouth 2 (two) times daily. 06/15/18   Osvaldo Shipper, MD  potassium chloride SA (K-DUR,KLOR-CON) 20 MEQ tablet Take 1 tablet (20 mEq total) by mouth once for 1 dose. 08/04/18 08/04/18  Tillery, Mariam Dollar, PA-C  prednisoLONE acetate (PRED FORTE) 1 % ophthalmic suspension One drop in operative eye(s) TID starting 2 days before surgery and after surgery for 3 weeks 09/14/18   [provider]  senna (SENOKOT) 8.6 MG TABS tablet Take 2 tablets (17.2 mg total) by mouth at bedtime. 06/15/18   Osvaldo Shipper, MD  sildenafil (REVATIO) 20 MG tablet Take 1 tablet (20 mg total) by mouth 3 (three) times daily. 02/25/19   Clegg, Amy D, NP  spironolactone (ALDACTONE) 25 MG tablet Take 0.5 tablets (12.5 mg total) by mouth daily. 11/04/18   Arnette Felts, FNP  traMADol (ULTRAM) 50 MG tablet Take 1 tablet (50 mg total) by mouth daily. 06/15/18   Osvaldo Shipper, MD  triamcinolone (NASACORT) 55 MCG/ACT AERO nasal inhaler Place 1 spray into the nose 2 (two) times daily. 01/18/19 01/18/20  Arnette Felts, FNP    Family History Family History  Problem Relation Age of Onset  . Hypertension Mother   . Diabetes Mother   . Cervical cancer Mother   . Heart attack Father   . Hypertension Sister   . Hypertension Brother   . Hypertension Sister   . Hypertension Sister   . Prostate cancer Brother   . HIV/AIDS Brother     Social History Social History   Tobacco Use  . Smoking status: Former Smoker    Packs/day: 0.25    Years: 20.00    Pack years: 5.00    Types: Cigarettes    Quit date: 01/31/2001    Years since quitting: 18.1   . Smokeless tobacco: Former Engineer, water Use Topics  . Alcohol use: Yes    Alcohol/week: 2.0 standard drinks    Types: 2 Shots of liquor per week  . Drug use: Never     Allergies   Ancef [cefazolin] and Sulfa antibiotics   Review of Systems Review of Systems  Constitutional: Positive for chills and fever.  Respiratory: Positive for cough. Negative for shortness of breath.   Cardiovascular: Negative for chest pain.  Gastrointestinal: Negative for abdominal pain, nausea and vomiting.  Genitourinary: Negative for frequency and urgency.  Musculoskeletal: Positive for myalgias. Negative for neck pain and neck stiffness.  Neurological: Positive for headaches. Negative for syncope and numbness.  All other systems reviewed and are negative.    Physical Exam Updated Vital Signs BP (!) 167/44   Pulse 62   Temp 100.3 F (37.9 C) (Oral)   Resp 16   Ht 5\' 4"  (1.626 m)   Wt 81.6 kg   SpO2 90%   BMI 30.90 kg/m   Physical Exam Vitals signs and nursing note reviewed.  Constitutional:      General: She is not in acute distress.    Appearance: She is well-developed.  HENT:     Head: Normocephalic and atraumatic.  Eyes:     General:        Right eye: No discharge.        Left eye: No discharge.     Conjunctiva/sclera: Conjunctivae normal.  Neck:     Musculoskeletal: Normal range of motion and neck supple. No neck rigidity.     Vascular: No JVD.     Trachea: No tracheal deviation.  Cardiovascular:     Rate and Rhythm: Normal rate and regular rhythm.  Pulmonary:     Effort: Pulmonary effort is normal.     Breath sounds: Examination of the left-lower field reveals rales. Rales present.     Comments: Scattered rales, worse on the left.  Well-healing right anterior chest wound from removal of Port-A-Cath.  SPO2 saturations 89% on room air, improved to 95% on 4 L via nasal cannula. Abdominal:     General: Bowel sounds are normal. There is no distension.     Palpations:  Abdomen is soft.     Tenderness: There is no  abdominal tenderness. There is no guarding or rebound.  Skin:    General: Skin is warm and dry.     Findings: No erythema.  Neurological:     General: No focal deficit present.     Mental Status: She is alert and oriented to person, place, and time.     Cranial Nerves: No cranial nerve deficit.  Psychiatric:        Behavior: Behavior normal.      ED Treatments / Results  Labs (all labs ordered are listed, but only abnormal results are displayed) Labs Reviewed  SARS CORONAVIRUS 2 (HOSPITAL ORDER, PERFORMED IN [place] [facility]) - Abnormal; Notable for the following components:      Result Value   SARS Coronavirus 2 POSITIVE (*)    All other components within normal limits  COMPREHENSIVE METABOLIC PANEL - Abnormal; Notable for the following components:   CO2 20 (*)    Glucose, Bld 172 (*)    BUN 57 (*)    Creatinine, Ser 2.16 (*)    Total Protein 8.3 (*)    GFR calc non Af Amer 23 (*)    GFR calc Af Amer 27 (*)    All other components within normal limits  CBC WITH DIFFERENTIAL/PLATELET - Abnormal; Notable for the following components:   RBC 2.57 (*)    Hemoglobin 7.7 (*)    HCT 23.7 (*)    RDW 18.6 (*)    Platelets 146 (*)    Lymphs Abs 0.5 (*)    All other components within normal limits  D-DIMER, QUANTITATIVE (NOT AT [facility]) - Abnormal; Notable for the following components:   D-Dimer, Quant 1.35 (*)    All other components within normal limits  LACTATE DEHYDROGENASE - Abnormal; Notable for the following components:   LDH 211 (*)    All other components within normal limits  FERRITIN - Abnormal; Notable for the following components:   Ferritin 2,115 (*)    All other components within normal limits  FIBRINOGEN - Abnormal; Notable for the following components:   Fibrinogen 499 (*)    All other components within normal limits  C-REACTIVE PROTEIN - Abnormal; Notable for the following components:   CRP 1.9 (*)    All  other components within normal limits  URINE CULTURE  CULTURE, BLOOD (ROUTINE X 2)  CULTURE, BLOOD (ROUTINE X 2)  LACTIC ACID, PLASMA  PROCALCITONIN  TRIGLYCERIDES  LACTIC ACID, PLASMA  URINALYSIS, ROUTINE W REFLEX MICROSCOPIC    EKG EKG Interpretation  Date/Time:  Thursday March 18 2019 09:53:00 EDT Ventricular Rate:  64 PR Interval:    QRS Duration: 91 QT Interval:  412 QTC Calculation: 426 R Axis:   22 Text Interpretation:  Sinus rhythm Short PR interval Probable LVH with secondary repol abnrm Baseline wander When compared with ECG of 08/04/2018 Rate faster Confirmed by Samuel Jester [phone]) on 03/18/2019 12:44:47 PM   Radiology Ct Head Wo Contrast  Result Date: 03/18/2019 CLINICAL DATA:  Headache. EXAM: CT HEAD WITHOUT CONTRAST TECHNIQUE: Contiguous axial images were obtained from the base of the skull through the vertex without intravenous contrast. COMPARISON:  CT scan of May 20, 2018. FINDINGS: Brain: Mild chronic ischemic white matter disease is noted. No mass effect or midline shift is noted. Ventricular size is within normal limits. There is no evidence of mass lesion, hemorrhage or acute infarction. Vascular: No hyperdense vessel or unexpected calcification. Skull: Normal. Negative for fracture or focal lesion. Sinuses/Orbits: No acute finding. Other: None. IMPRESSION: Mild  chronic ischemic white matter disease. No acute intracranial abnormality seen. Electronically Signed   By: Lupita Raider M.D.   On: 03/18/2019 08:45   Ir Removal Tun Access W/ Port W/o Fl  Result Date: 03/16/2019 INDICATION: 63 year old female with a history of myelodysplastic syndrome. She has a port catheter which was placed by interventional radiology in September of 2018. The catheter has worked well, however she no longer uses it and therefore presents for removal. EXAM: REMOVAL RIGHT IJ VEIN PORT-A-CATH MEDICATIONS: 900 mg Cleocin; The antibiotic was administered within an appropriate time  interval prior to skin puncture. ANESTHESIA/SEDATION: Moderate (conscious) sedation was employed during this procedure. A total of Versed 2 mg and Fentanyl 50 mcg was administered intravenously. Moderate Sedation Time: 20 minutes. The patient's level of consciousness and vital signs were monitored continuously by radiology nursing throughout the procedure under my direct supervision. FLUOROSCOPY TIME:  None COMPLICATIONS: None immediate. PROCEDURE: Informed written consent was obtained from the patient after a thorough discussion of the procedural risks, benefits and alternatives. All questions were addressed. Maximal Sterile Barrier Technique was utilized including caps, mask, sterile gowns, sterile gloves, sterile drape, hand hygiene and skin antiseptic. A timeout was performed prior to the initiation of the procedure. The right chest was prepped and draped in a sterile fashion. Lidocaine was utilized for local anesthesia. An incision was made over the previously healed surgical incision. Utilizing blunt dissection, the port catheter and reservoir were removed from the underlying subcutaneous tissue in their entirety. Securing sutures were also removed. The pocket was irrigated with a copious amount of sterile normal saline. The pocket was closed with interrupted 3-0 Vicryl stitches. The subcutaneous tissue was closed with 3-0 Vicryl interrupted subcutaneous stitches. A 4-0 Vicryl running subcuticular stitch was utilized to approximate the skin. Dermabond was applied. IMPRESSION: Successful right IJ vein Port-A-Cath explant. Electronically Signed   By: Malachy Moan M.D.   On: 03/16/2019 14:23   Dg Chest Port 1 View  Result Date: 03/18/2019 CLINICAL DATA:  63 year old female with sudden onset of headache, migraines, and weakness. EXAM: PORTABLE CHEST 1 VIEW COMPARISON:  Chest radiograph dated 02/15/2019 FINDINGS: There has been interval removal of the Port-A-Cath. Bilateral perihilar vascular prominence  likely mild vascular congestion. An area of increased density in the left upper lung field/left suprahilar region may represent developing infiltrate. No pleural effusion or pneumothorax. Stable cardiomegaly. Atherosclerotic calcification of the aortic arch. No acute osseous pathology. IMPRESSION: Left upper lung field opacity. Electronically Signed   By: Elgie Collard M.D.   On: 03/18/2019 09:26    Procedures .Critical Care Performed by: Jeanie Sewer, PA-C Authorized by: Jeanie Sewer, PA-C   Critical care provider statement:    Critical care time (minutes):  40   Critical care was necessary to treat or prevent imminent or life-threatening deterioration of the following conditions:  Respiratory failure   Critical care was time spent personally by me on the following activities:  Discussions with consultants, evaluation of patient's response to treatment, examination of patient, ordering and performing treatments and interventions, ordering and review of laboratory studies, ordering and review of radiographic studies, pulse oximetry, re-evaluation of patient's condition, obtaining history from patient or surrogate and review of old charts   (including critical care time)  Medications Ordered in ED Medications  acetaminophen (TYLENOL) tablet 650 mg (has no administration in time range)  dexamethasone (DECADRON) injection 6 mg (has no administration in time range)  albuterol (VENTOLIN HFA) 108 (90 Base) MCG/ACT inhaler 2  puff (has no administration in time range)     Initial Impression / Assessment and Plan / ED Course  I have reviewed the triage vital signs and the nursing notes.  Pertinent labs & imaging results that were available during my care of the patient were reviewed by me and considered in my medical decision making (see chart for details).        Malyiah Maslin was evaluated in Emergency Department on 03/18/2019 for the symptoms described in the history of present illness.  She was evaluated in the context of the global COVID-19 pandemic, which necessitated consideration that the patient might be at risk for infection with the SARS-CoV-2 virus that causes COVID-19. Institutional protocols and algorithms that pertain to the evaluation of patients at risk for COVID-19 are in a state of rapid change based on information released by regulatory bodies including the CDC and federal and state organizations. These policies and algorithms were followed during the patient's care in the ED.  Patient presenting for evaluation of flulike symptoms that began yesterday.  She exhibits low-grade temperature, hypoxic to 85% on room air with improvement on supplemental oxygen.  Appears unwell but nontoxic.  Chest x-ray shows left upper lung field opacity.  Head CT shows no acute intracranial abnormalities.  EKG shows normal sinus rhythm, no acute ischemic abnormalities.  Lab work reviewed by me consistent with COVID-19 infection in her rapid COVID test is positive.  She has some renal insufficiency at her baseline.  She is anemic but stable compared to baseline.  No risk factors for meningitis, no nuchal rigidity on examination.  With hypoxia, she would benefit from admission to the hospital.  Spoke with Dr. Alanda Slim with [facility] hospitalist service who agrees to assume care of patient and bring her into the hospital for further evaluation and management.  Final Clinical Impressions(s) / ED Diagnoses   Final diagnoses:  COVID-19 virus detected  Acute respiratory failure with hypoxia [facility])    ED Discharge Orders    None       Jeanie Sewer, PA-C 03/18/19 1258    Samuel Jester, DO 03/22/19 1715

## 2019-03-18 NOTE — ED Notes (Addendum)
Unable to obtain 2nd set of cultures, pt difficult stick

## 2019-03-18 NOTE — ED Notes (Signed)
ED TO INPATIENT HANDOFF REPORT  Name/Age/Gender Patricia Mata 63 y.o. female  Code Status Code Status History    Date Active Date Inactive Code Status Order ID Comments User Context   06/09/2018 0401 06/15/2018 1747 DNR [id number]  Toy Baker, MD ED   05/23/2018 1253 05/26/2018 2355 DNR [id number]  Irean Hong, NP Inpatient   05/11/2018 1109 05/23/2018 1253 Full Code [id number]  Marijean Heath, NP ED   06/14/2017 1549 06/16/2017 1645 Full Code [id number]  Lady Deutscher, MD Inpatient   Advance Care Planning Activity    Questions for Most Recent Historical Code Status (Order [id number])    Question Answer Comment   In the event of cardiac or respiratory ARREST Do not call a "code blue"    In the event of cardiac or respiratory ARREST Do not perform Intubation, CPR, defibrillation or ACLS    In the event of cardiac or respiratory ARREST Use medication by any route, position, wound care, and other measures to relive pain and suffering. May use oxygen, suction and manual treatment of airway obstruction as needed for comfort.       Home/SNF/Other Home  Chief Complaint headache fever chills  Level of Care/Admitting Diagnosis ED Disposition    ED Disposition Condition [facility]: [place] [[id number]]  Level of Care: Progressive [102]  Covid Evaluation: Confirmed COVID Positive  Diagnosis: Pneumonia due to COVID-19 virus [[id number]]  Admitting Physician: Mercy Riding [[id number]]  Attending Physician: Mercy Riding [[id number]]  Estimated length of stay: past midnight tomorrow  Certification:: I certify this patient will need inpatient services for at least 2 midnights  PT Class (Do Not Modify): Inpatient [101]  PT Acc Code (Do Not Modify): Private [1]       Medical History Past Medical History:  Diagnosis Date  . Acute kidney injury (nontraumatic) ([place])   . Acute renal failure (ARF) ([place]) 06/08/2018  . Cancer [facility]) 2008    Colon   . Diabetes mellitus without complication ([place])   . Gout 09/08/2018  . Hypertension   . Macrocytic anemia     Allergies Allergies  Allergen Reactions  . Ancef [Cefazolin] Itching    Severe itching- after procedure, ancef was the antibiotic.-04/02/17  . 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 sacral wound is stage 4 pressure injury when assessed on 11/26; bone palpable with swab   05/22/18    0800     300   Wound / Incision (Open or Dehisced) 05/15/18 Other (Comment) Labia Right;Distal eraser sized red wound bed   05/15/18    2000    Labia   307   Wound / Incision (Open or Dehisced) 05/18/18 Other (Comment) Sacrum Right;Left exorciation on sacrum; red, open wounds;  Full thickness skin loss related to MASD, NOT a pressure injury   05/18/18    0800    Sacrum   304  Labs/Imaging Results for orders placed or performed during the hospital encounter of 03/18/19 (from the past 48 hour(s))  Comprehensive metabolic panel     Status: Abnormal   Collection Time: 03/18/19  8:47 AM  Result Value Ref Range   Sodium 137 135 - 145 mmol/L   Potassium 4.0 3.5 - 5.1 mmol/L   Chloride 104 98 - 111 mmol/L   CO2 20 (L) 22 - 32 mmol/L   Glucose, Bld 172 (H) 70 - 99 mg/dL   BUN 57 (H) 8 - 23 mg/dL   Creatinine, Ser 2.16 (H) 0.44 - 1.00 mg/dL   Calcium 9.5 8.9 - 10.3 mg/dL   Total Protein 8.3 (H) 6.5 - 8.1 g/dL   Albumin 4.2 3.5 - 5.0 g/dL   AST 32 15 - 41 U/L   ALT 18 0 - 44 U/L   Alkaline Phosphatase 96 38 - 126 U/L   Total Bilirubin  1.0 0.3 - 1.2 mg/dL   GFR calc non Af Amer 23 (L) >60 mL/min   GFR calc Af Amer 27 (L) >60 mL/min   Anion gap 13 5 - 15    Comment: Performed at [facility], [place] [address], [place], [place] [address]  Lactic acid, plasma     Status: None   Collection Time: 03/18/19  8:47 AM  Result Value Ref Range   Lactic Acid, Venous 1.0 0.5 - 1.9 mmol/L    Comment: Performed at [facility], [place] [address] [place], [place] [address]  CBC with Differential     Status: Abnormal   Collection Time: 03/18/19  8:47 AM  Result Value Ref Range   WBC 8.9 4.0 - 10.5 K/uL   RBC 2.57 (L) 3.87 - 5.11 MIL/uL   Hemoglobin 7.7 (L) 12.0 - 15.0 g/dL   HCT 23.7 (L) 36.0 - 46.0 %   MCV 92.2 80.0 - 100.0 fL   MCH 30.0 26.0 - 34.0 pg   MCHC 32.5 30.0 - 36.0 g/dL   RDW 18.6 (H) 11.5 - 15.5 %   Platelets 146 (L) 150 - 400 K/uL   nRBC 0.0 0.0 - 0.2 %   Neutrophils Relative % 85 %   Neutro Abs 7.6 1.7 - 7.7 K/uL   Lymphocytes Relative 5 %   Lymphs Abs 0.5 (L) 0.7 - 4.0 K/uL   Monocytes Relative 8 %   Monocytes Absolute 0.7 0.1 - 1.0 K/uL   Eosinophils Relative 1 %   Eosinophils Absolute 0.1 0.0 - 0.5 K/uL   Basophils Relative 0 %   Basophils Absolute 0.0 0.0 - 0.1 K/uL   Immature Granulocytes 1 %   Abs Immature Granulocytes 0.07 0.00 - 0.07 K/uL    Comment: Performed at [facility], [place] [address], [place], [place] [address]  D-dimer, quantitative     Status: Abnormal   Collection Time: 03/18/19  9:28 AM  Result Value Ref Range   D-Dimer, Quant 1.35 (H) 0.00 - 0.50 ug/mL-FEU    Comment: (NOTE) At the manufacturer cut-off of 0.50 ug/mL FEU, this assay has been documented to exclude PE with a sensitivity and negative predictive value of 97 to 99%.  At this time, this assay has not been approved by the FDA to exclude DVT/VTE. Results should be correlated with clinical presentation. Performed at [facility], [place] [address] [place], [place] [address]   Procalcitonin     Status: None   Collection Time: 03/18/19  9:28 AM  Result Value Ref  Range   Procalcitonin 0.18 ng/mL    Comment:        Interpretation: PCT (Procalcitonin) <= 0.5 ng/mL: Systemic infection (sepsis) is not likely. Local bacterial infection is possible. (NOTE)       Sepsis PCT Algorithm           Lower Respiratory Tract                                      Infection PCT Algorithm    ----------------------------     ----------------------------         PCT < 0.25 ng/mL                PCT < 0.10 ng/mL         Strongly encourage             Strongly discourage   discontinuation of antibiotics    initiation of antibiotics    ----------------------------     -----------------------------       PCT 0.25 - 0.50 ng/mL            PCT 0.10 - 0.25 ng/mL               OR       >80% decrease in PCT            Discourage initiation of                                            antibiotics      Encourage discontinuation           of antibiotics    ----------------------------     -----------------------------         PCT >= 0.50 ng/mL              PCT 0.26 - 0.50 ng/mL               AND        <80% decrease in PCT             Encourage initiation of                                             antibiotics       Encourage continuation           of antibiotics    ----------------------------     -----------------------------        PCT >= 0.50 ng/mL                  PCT > 0.50 ng/mL               AND         increase in PCT                  Strongly encourage                                      initiation of antibiotics    Strongly encourage escalation           of antibiotics                                     -----------------------------  PCT <= 0.25 ng/mL                                                 OR                                        > 80% decrease in PCT                                      Discontinue / Do not initiate                                             antibiotics Performed at [place] [address], [place], [place] [address]   Lactate dehydrogenase     Status: Abnormal   Collection Time: 03/18/19  9:28 AM  Result Value Ref Range   LDH 211 (H) 98 - 192 U/L    Comment: Performed at [facility], [place] [address], [place], [place] [address]  Ferritin     Status: Abnormal   Collection Time: 03/18/19  9:28 AM  Result Value Ref Range   Ferritin 2,115 (H) 11 - 307 ng/mL    Comment: Performed at [facility], [place] [address] [place], [place] [address]  Triglycerides     Status: None   Collection Time: 03/18/19  9:28 AM  Result Value Ref Range   Triglycerides 70 <150 mg/dL    Comment: Performed at [facility], [place] [address] [place], [place] [address]  Fibrinogen     Status: Abnormal   Collection Time: 03/18/19  9:28 AM  Result Value Ref Range   Fibrinogen 499 (H) 210 - 475 mg/dL    Comment: Performed at [facility], [place] [address], [place], [place] [address]  C-reactive protein     Status: Abnormal   Collection Time: 03/18/19  9:28 AM  Result Value Ref Range   CRP 1.9 (H) <1.0 mg/dL    Comment: Performed at [facility], [place] [address], [place], [place] [address]  SARS Coronavirus 2 [facility] order, Performed in [facility] hospital lab) Nasopharyngeal Nasopharyngeal Swab     Status: Abnormal   Collection Time: 03/18/19  9:39 AM   Specimen: Nasopharyngeal Swab  Result Value Ref Range   SARS Coronavirus 2 POSITIVE (A) NEGATIVE    Comment: RESULT CALLED TO, READ BACK BY AND VERIFIED WITH: [place] FAWZE,PA [id number] @ 8101 BY J SCOTTON (NOTE) If result is NEGATIVE SARS-CoV-2 target nucleic acids are NOT DETECTED. The SARS-CoV-2 RNA is generally detectable in upper and lower  respiratory specimens during the acute phase of infection. The lowest   concentration of SARS-CoV-2 viral copies this assay can detect is 250  copies / mL. A negative result does not preclude SARS-CoV-2 infection  and should not be used as the sole basis for treatment or other  patient management decisions.  A negative result may occur with  improper specimen collection / handling, submission of specimen other  than nasopharyngeal swab, presence of viral mutation(s) within  the  areas targeted by this assay, and inadequate number of viral copies  (<250 copies / mL). A negative result must be combined with clinical  observations, patient history, and epidemiological information. If result is POSITIVE SARS-CoV-2 target nucleic acids are DETECTE D. The SARS-CoV-2 RNA is generally detectable in upper and lower  respiratory specimens during the acute phase of infection.  Positive  results are indicative of active infection with SARS-CoV-2.  Clinical  correlation with patient history and other diagnostic information is  necessary to determine patient infection status.  Positive results do  not rule out bacterial infection or co-infection with other viruses. If result is PRESUMPTIVE POSTIVE SARS-CoV-2 nucleic acids MAY BE PRESENT.   A presumptive positive result was obtained on the submitted specimen  and confirmed on repeat testing.  While 2019 novel coronavirus  (SARS-CoV-2) nucleic acids may be present in the submitted sample  additional confirmatory testing may be necessary for epidemiological  and / or clinical management purposes  to differentiate between  SARS-CoV-2 and other Sarbecovirus currently known to infect humans.  If clinically indicated additional testing with an alternate test  methodology (LAB745 3) is advised. The SARS-CoV-2 RNA is generally  detectable in upper and lower respiratory specimens during the acute  phase of infection. The expected result is Negative. Fact Sheet for Patients:  StrictlyIdeas.no Fact Sheet  for Healthcare Providers: BankingDealers.co.za This test is not yet approved or cleared by the [place] FDA and has been authorized for detection and/or diagnosis of SARS-CoV-2 by FDA under an Emergency Use Authorization (EUA).  This EUA will remain in effect (meaning this test can be used) for the duration of the COVID-19 declaration under Section 564(b)(1) of the Act, 21 U.S.C. section 360bbb-3(b)(1), unless the authorization is terminated or revoked sooner. Performed at [facility], [place] [address], [place], [place] [address]   Lactic acid, plasma     Status: None   Collection Time: 03/18/19  1:03 PM  Result Value Ref Range   Lactic Acid, Venous 0.9 0.5 - 1.9 mmol/L    Comment: Performed at [facility], [place] [address] [place], [place] [address]  Urinalysis, Routine w reflex microscopic     Status: Abnormal   Collection Time: 03/18/19  1:37 PM  Result Value Ref Range   Color, Urine YELLOW YELLOW   APPearance CLEAR CLEAR   Specific Gravity, Urine 1.009 1.005 - 1.030   pH 5.0 5.0 - 8.0   Glucose, UA NEGATIVE NEGATIVE mg/dL   Hgb urine dipstick NEGATIVE NEGATIVE   Bilirubin Urine NEGATIVE NEGATIVE   Ketones, ur NEGATIVE NEGATIVE mg/dL   Protein, ur 100 (A) NEGATIVE mg/dL   Nitrite NEGATIVE NEGATIVE   Leukocytes,Ua NEGATIVE NEGATIVE   RBC / HPF 0-5 0 - 5 RBC/hpf   WBC, UA 0-5 0 - 5 WBC/hpf   Bacteria, UA NONE SEEN NONE SEEN   Squamous Epithelial / LPF 0-5 0 - 5   Mucus PRESENT     Comment: Performed at [facility], [place] [address] [place], [place] [address]   Ct Head Wo Contrast  Result Date: 03/18/2019 CLINICAL DATA:  Headache. EXAM: CT HEAD WITHOUT CONTRAST TECHNIQUE: Contiguous axial images were obtained from the base of the skull through the vertex without intravenous contrast. COMPARISON:  CT scan of May 20, 2018. FINDINGS: Brain: Mild chronic ischemic white matter disease is noted. No  mass effect or midline shift is noted. Ventricular size is within normal limits. There is no evidence  of mass lesion, hemorrhage or acute infarction. Vascular: No hyperdense vessel or unexpected calcification. Skull: Normal. Negative for fracture or focal lesion. Sinuses/Orbits: No acute finding. Other: None. IMPRESSION: Mild chronic ischemic white matter disease. No acute intracranial abnormality seen. Electronically Signed   By: Marijo Conception M.D.   On: 03/18/2019 08:45   Dg Chest Port 1 View  Result Date: 03/18/2019 CLINICAL DATA:  63 year old female with sudden onset of headache, migraines, and weakness. EXAM: PORTABLE CHEST 1 VIEW COMPARISON:  Chest radiograph dated 02/15/2019 FINDINGS: There has been interval removal of the Port-A-Cath. Bilateral perihilar vascular prominence likely mild vascular congestion. An area of increased density in the left upper lung field/left suprahilar region may represent developing infiltrate. No pleural effusion or pneumothorax. Stable cardiomegaly. Atherosclerotic calcification of the aortic arch. No acute osseous pathology. IMPRESSION: Left upper lung field opacity. Electronically Signed   By: Anner Crete M.D.   On: 03/18/2019 09:26    Pending Labs Unresulted Labs (From admission, onward)    Start     Ordered   03/18/19 0928  Blood Culture (routine x 2)  BLOOD CULTURE X 2,   STAT     03/18/19 0927   03/18/19 0847  Urine culture  ONCE - STAT,   STAT     03/18/19 0846   Signed and Held  ABO/Rh  Once,   R     Signed and Held   Signed and Held  CBC  (enoxaparin (LOVENOX)    CrCl >/= 30 ml/min)  Once,   R    Comments: Baseline for enoxaparin therapy IF NOT ALREADY DRAWN.  Notify MD if PLT < 100 K.    Signed and Held   Signed and Held  Creatinine, serum  (enoxaparin (LOVENOX)    CrCl >/= 30 ml/min)  Once,   R    Comments: Baseline for enoxaparin therapy IF NOT ALREADY DRAWN.    Signed and Held   Signed and Held  Creatinine, serum  (enoxaparin (LOVENOX)     CrCl >/= 30 ml/min)  Weekly,   R    Comments: while on enoxaparin therapy    Signed and Held   Signed and Held  CBC with Differential/Platelet  Daily,   R     Signed and Held   Signed and Held  Comprehensive metabolic panel  Daily,   R     Signed and Held   Signed and Held  C-reactive protein  Daily,   R     Signed and Held   Signed and Held  D-dimer, quantitative (not at [facility])  Daily,   R     Signed and Held   Signed and Held  Ferritin  Daily,   R     Signed and Held   Signed and Held  Hemoglobin A1c  Tomorrow morning,   R    Comments: To assess prior glycemic control    Signed and Held   Signed and Held  CBC  (heparin)  Once,   R    Comments: Baseline for heparin therapy IF NOT ALREADY DRAWN.  Notify MD if PLT < 100 K.    Signed and Held   Signed and Held  Creatinine, serum  (heparin)  Once,   R    Comments: Baseline for heparin therapy IF NOT ALREADY DRAWN.    Signed and Held          Vitals/Pain Today's Vitals   03/18/19 1530 03/18/19 1630 03/18/19 1652 03/18/19 1652  BP: Marland Kitchen)  159/40 (!) 160/38    Pulse: (!) 59 64    Resp: (!) 22 13    Temp:   99 F (37.2 C)   TempSrc:   Oral   SpO2: 97% 94%    Weight:      Height:      PainSc:    4     Isolation Precautions Airborne and Contact precautions  Medications Medications  dexamethasone (DECADRON) injection 6 mg (6 mg Intravenous Given 03/18/19 1325)  albuterol (VENTOLIN HFA) 108 (90 Base) MCG/ACT inhaler 2 puff (has no administration in time range)  acetaminophen (TYLENOL) tablet 650 mg (650 mg Oral Given 03/18/19 1316)    Mobility walks

## 2019-03-18 NOTE — ED Notes (Signed)
Unable to obtain IV at this time.  Pt difficult stick.  2x RN assist.  Able to get bloodwork

## 2019-03-19 ENCOUNTER — Telehealth: Payer: Self-pay | Admitting: Nurse Practitioner

## 2019-03-19 DIAGNOSIS — N185 Chronic kidney disease, stage 5: Secondary | ICD-10-CM

## 2019-03-19 DIAGNOSIS — E038 Other specified hypothyroidism: Secondary | ICD-10-CM

## 2019-03-19 DIAGNOSIS — I10 Essential (primary) hypertension: Secondary | ICD-10-CM

## 2019-03-19 DIAGNOSIS — D631 Anemia in chronic kidney disease: Secondary | ICD-10-CM

## 2019-03-19 DIAGNOSIS — E7800 Pure hypercholesterolemia, unspecified: Secondary | ICD-10-CM

## 2019-03-19 DIAGNOSIS — I350 Nonrheumatic aortic (valve) stenosis: Secondary | ICD-10-CM

## 2019-03-19 DIAGNOSIS — U071 COVID-19: Principal | ICD-10-CM

## 2019-03-19 DIAGNOSIS — I4891 Unspecified atrial fibrillation: Secondary | ICD-10-CM

## 2019-03-19 DIAGNOSIS — D469 Myelodysplastic syndrome, unspecified: Secondary | ICD-10-CM

## 2019-03-19 DIAGNOSIS — E119 Type 2 diabetes mellitus without complications: Secondary | ICD-10-CM

## 2019-03-19 LAB — FERRITIN: Ferritin: 2251 ng/mL — ABNORMAL HIGH (ref 11–307)

## 2019-03-19 LAB — CBC WITH DIFFERENTIAL/PLATELET
Abs Immature Granulocytes: 0.06 10*3/uL (ref 0.00–0.07)
Basophils Absolute: 0 10*3/uL (ref 0.0–0.1)
Basophils Relative: 0 %
Eosinophils Absolute: 0 10*3/uL (ref 0.0–0.5)
Eosinophils Relative: 0 %
HCT: 19.4 % — ABNORMAL LOW (ref 36.0–46.0)
Hemoglobin: 6.4 g/dL — CL (ref 12.0–15.0)
Immature Granulocytes: 1 %
Lymphocytes Relative: 9 %
Lymphs Abs: 0.5 10*3/uL — ABNORMAL LOW (ref 0.7–4.0)
MCH: 29.9 pg (ref 26.0–34.0)
MCHC: 33 g/dL (ref 30.0–36.0)
MCV: 90.7 fL (ref 80.0–100.0)
Monocytes Absolute: 0.6 10*3/uL (ref 0.1–1.0)
Monocytes Relative: 11 %
Neutro Abs: 4.3 10*3/uL (ref 1.7–7.7)
Neutrophils Relative %: 79 %
Platelets: 122 10*3/uL — ABNORMAL LOW (ref 150–400)
RBC: 2.14 MIL/uL — ABNORMAL LOW (ref 3.87–5.11)
RDW: 18.2 % — ABNORMAL HIGH (ref 11.5–15.5)
WBC: 5.4 10*3/uL (ref 4.0–10.5)
nRBC: 0.4 % — ABNORMAL HIGH (ref 0.0–0.2)

## 2019-03-19 LAB — HEMOGLOBIN A1C
Hgb A1c MFr Bld: 6.8 % — ABNORMAL HIGH (ref 4.8–5.6)
Mean Plasma Glucose: 148.46 mg/dL

## 2019-03-19 LAB — COMPREHENSIVE METABOLIC PANEL
ALT: 13 U/L (ref 0–44)
AST: 24 U/L (ref 15–41)
Albumin: 3.4 g/dL — ABNORMAL LOW (ref 3.5–5.0)
Alkaline Phosphatase: 76 U/L (ref 38–126)
Anion gap: 11 (ref 5–15)
BUN: 72 mg/dL — ABNORMAL HIGH (ref 8–23)
CO2: 20 mmol/L — ABNORMAL LOW (ref 22–32)
Calcium: 8.7 mg/dL — ABNORMAL LOW (ref 8.9–10.3)
Chloride: 104 mmol/L (ref 98–111)
Creatinine, Ser: 2.38 mg/dL — ABNORMAL HIGH (ref 0.44–1.00)
GFR calc Af Amer: 24 mL/min — ABNORMAL LOW (ref >60)
GFR calc non Af Amer: 21 mL/min — ABNORMAL LOW (ref >60)
Glucose, Bld: 324 mg/dL — ABNORMAL HIGH (ref 70–99)
Potassium: 4 mmol/L (ref 3.5–5.1)
Sodium: 135 mmol/L (ref 135–145)
Total Bilirubin: 0.8 mg/dL (ref 0.3–1.2)
Total Protein: 7 g/dL (ref 6.5–8.1)

## 2019-03-19 LAB — URINE CULTURE

## 2019-03-19 LAB — PREPARE RBC (CROSSMATCH)

## 2019-03-19 LAB — GLUCOSE, CAPILLARY
Glucose-Capillary: 398 mg/dL — ABNORMAL HIGH (ref 70–99)
Glucose-Capillary: 189 mg/dL — ABNORMAL HIGH (ref 70–99)
Glucose-Capillary: 246 mg/dL — ABNORMAL HIGH (ref 70–99)
Glucose-Capillary: 422 mg/dL — ABNORMAL HIGH (ref 70–99)

## 2019-03-19 LAB — ABO/RH: ABO/RH(D): A NEG

## 2019-03-19 LAB — D-DIMER, QUANTITATIVE: D-Dimer, Quant: 1 ug/mL-FEU — ABNORMAL HIGH (ref 0.00–0.50)

## 2019-03-19 LAB — C-REACTIVE PROTEIN: CRP: 7.4 mg/dL — ABNORMAL HIGH (ref <1.0)

## 2019-03-19 LAB — NOVEL CORONAVIRUS, NAA: SARS-CoV-2, NAA: NOT DETECTED

## 2019-03-19 MED ORDER — IPRATROPIUM-ALBUTEROL 20-100 MCG/ACT IN AERS
1.0000 | INHALATION_SPRAY | Freq: Four times a day (QID) | RESPIRATORY_TRACT | Status: DC
  Administered 2019-03-19 – 2019-03-23 (×14): 1 via RESPIRATORY_TRACT
  Filled 2019-03-19: qty 4

## 2019-03-19 MED ORDER — FUROSEMIDE 20 MG PO TABS: 40.0000 mg | ORAL_TABLET | Freq: Every day | ORAL | Status: DC

## 2019-03-19 MED ORDER — FUROSEMIDE 10 MG/ML IJ SOLN
40.0000 mg | Freq: Once | INTRAMUSCULAR | Status: AC
  Administered 2019-03-19: 40 mg via INTRAVENOUS
  Filled 2019-03-19: qty 4

## 2019-03-19 MED ORDER — INSULIN ASPART 100 UNIT/ML ~~LOC~~ SOLN
10.0000 [IU] | Freq: Once | SUBCUTANEOUS | Status: AC
  Administered 2019-03-19: 10 [IU] via SUBCUTANEOUS

## 2019-03-19 MED ORDER — SODIUM CHLORIDE 0.9% IV SOLUTION: Freq: Once | INTRAVENOUS | Status: DC

## 2019-03-19 MED ORDER — INSULIN ASPART 100 UNIT/ML ~~LOC~~ SOLN
0.0000 [IU] | Freq: Three times a day (TID) | SUBCUTANEOUS | Status: DC
  Administered 2019-03-20: 7 [IU] via SUBCUTANEOUS
  Administered 2019-03-20: 11 [IU] via SUBCUTANEOUS
  Administered 2019-03-20 – 2019-03-21 (×2): 7 [IU] via SUBCUTANEOUS
  Administered 2019-03-21: 4 [IU] via SUBCUTANEOUS
  Administered 2019-03-22: 15 [IU] via SUBCUTANEOUS
  Administered 2019-03-22 (×2): 7 [IU] via SUBCUTANEOUS
  Administered 2019-03-23: 20 [IU] via SUBCUTANEOUS
  Administered 2019-03-23: 7 [IU] via SUBCUTANEOUS

## 2019-03-19 MED ORDER — INSULIN ASPART 100 UNIT/ML ~~LOC~~ SOLN
0.0000 [IU] | SUBCUTANEOUS | Status: DC
  Administered 2019-03-19: 4 [IU] via SUBCUTANEOUS
  Administered 2019-03-19: 7 [IU] via SUBCUTANEOUS

## 2019-03-19 MED ORDER — INSULIN ASPART 100 UNIT/ML ~~LOC~~ SOLN
0.0000 [IU] | Freq: Every day | SUBCUTANEOUS | Status: DC
  Administered 2019-03-20: 3 [IU] via SUBCUTANEOUS
  Administered 2019-03-21: 4 [IU] via SUBCUTANEOUS
  Administered 2019-03-22: 5 [IU] via SUBCUTANEOUS

## 2019-03-19 NOTE — Plan of Care (Signed)
Family updated via patient.  Problem: Education: Goal: Knowledge of risk factors and measures for prevention of condition will improve Outcome: Progressing   Problem: Coping: Goal: Psychosocial and spiritual needs will be supported Outcome: Progressing   Problem: Respiratory: Goal: Will maintain a patent airway Outcome: Progressing Goal: Complications related to the disease process, condition or treatment will be avoided or minimized Outcome: Progressing

## 2019-03-19 NOTE — Telephone Encounter (Signed)
Result for 9.2.20 test  Negative COVID results given. Patient results "NOT Detected." Caller expressed understanding.

## 2019-03-19 NOTE — Plan of Care (Signed)
Plan of care reviewed. 

## 2019-03-19 NOTE — Progress Notes (Signed)
PROGRESS NOTE    Patricia Mata  WUJ:[id number] DOB: 03/31/1956 DOA: 03/18/2019 PCP: [name], FNP   Brief Narrative:  Patricia Mata is a 63 y.o. BF PMHx  DM-2, CKD4 severe aortic stenosis, diastolic CHF, A. fib, colon cancer, anemia of chronic disease and hypothyroidism   Presenting with multiple complaints including fever, chills, body ache, headache and cough. Patient was in his usual state of health until last night when she suddenly started having headache followed by fever, chills, generalized body pain and headache.  Her husband was recently hospitalized with COVID about 3 weeks ago and discharge home.  She said she was tested for COVID yesterday but has not had a result back.  She denies chest pain, nausea, vomiting, abdominal pain, diarrhea or UTI symptoms.  She denies neck stiffness, photophobia or phonophobia.  Lives with her husband.  Denies smoking cigarettes, drinking alcohol recreational drug use.  In ED, initially desaturated to 89% but recovered to 95% on 4 L.  However, she continued to desaturate to mid 80s and finally requiring 10 L by HFNC.  Creatinine 2.16 (baseline).  BUN 57.  Glucose 172.  Hgb 7.7 (baseline).  Platelet 146.  LDH 2 111.  Ferritin 2115.  CRP 1.9.  Lactic acid negative.  Procalcitonin 0.18.  D-dimer 1.36.  Fibrinogen 499.  COVID-19 positive.  UA not impressive.  CT head without contrast without acute finding.  Portable CXR with LUL opacity concerning for pneumonia.  EKG normal sinus rhythm without acute finding.  Patient was given Tylenol and hospital service was called for admission for COVID-19 infection.    Subjective: 9/4 A/O x4, negative S OB, negative abdominal pain, negative CP, negative N/V.  States feels improved    Assessment & Plan:   Active Problems:   Anemia in chronic kidney disease   MDS (myelodysplastic syndrome) (HCC)   Essential hypertension   Hyperlipidemia   Severe aortic stenosis   Carotid artery disease (HCC)   Atrial  fibrillation with rapid ventricular response (HCC)   Acute hypoxemic respiratory failure (HCC)   Gout   Type 2 diabetes mellitus without complication, without long-term current use of insulin (HCC)   Hypothyroidism   Pneumonia due to COVID-19 virus  Acute respiratory failure with hypoxia/COVID-19 pneumonia Recent Labs  Lab 03/18/19 0928 03/19/19 0253  CRP 1.9* 7.4*   Recent Labs  Lab 03/18/19 0928 03/19/19 0253  DDIMER 1.35* 1.00*  -Upon admission patient with new onset O2 demand and PCXR showing opacification met criteria for Remdesivir. -Remdesivir.  Per pharmacy protocol - Decadron 6 mg daily -Does not meet criteria for Actemra at this time - Vitamins per COVID protocol - Combivent QID   Chronic diastolic CHF/pulmonary HTN -Strict in and out -Daily weight  -Amiodarone 200 mg daily  - Amlodipine 10 mg daily - Lasix 40 mg daily (hold) - Hydralazine 100 mg TID -Toprol 25 mg daily (hold)  Severe aortic stenosis - Currently asymptomatic - Schedule follow-up appointment in 1 to 2 weeks with Dr. Glori Bickers CHF clinic.  Chronic diastolic CHF, pulmonary HTN, aortic stenosis  Essential HTN - See heart failure  Paroxysmal atrial fibrillation -CHADS2VASc=3 -Currently NSR - See CHF - Although bradycardic on home medication may have to restart her Toprol if she reverts to A. Fib. -Unsure why patient is not on anticoagulant.  Will need to discuss with her risk and benefits of starting anticoagulant for her A. fib  CKD stage IV (baseline Cr ~2.4) Recent Labs  Lab 03/16/19 1017 03/18/19 0847 03/19/19 0253  CREATININE 2.04* 2.16* 2.38*  -Currently at baseline  Anemia of chronic disease (baseline hemoglobin 7) - Patient transfused if hemoglobin<7 - 9/4 transfuse 1 unit PRBC Recent Labs  Lab 03/16/19 1017 03/18/19 0847 03/19/19 0253  HGB 7.8* 7.7* 6.4*   Diabetes type 2 controlled without complication - 5/03 hemoglobin A1c= 5.8 - Resistant SSI - Lipid  panel pending  Hypothyroidism - 7/6 TSH= 50 - Recheck TSH - Levothyroxine 50 mcg daily  Thrombocytopenia (baseline 150-200) - Slightly low most likely secondary to infection, will continue to follow    DVT prophylaxis: Subcu heparin Code Status: Full Family Communication: 9/4 discussed plan of care with patient sister.  Answered all questions. Disposition Plan: TBD   Consultants:  None    Procedures/Significant Events:  9/3 PCXR: Left upper lung field opacification 9/3 CT head  Wo contrast: Negative acute intracranial abnormality 9/4 transfuse 1 unit PRBC    I have personally reviewed and interpreted all radiology studies and my findings are as above.  VENTILATOR SETTINGS: Nasal cannula O2 flow rate; 2 L/min SPO2; 100%    Cultures 9/3 blood pending 9/3 urine pending 9/3 SARS coronavirus positive   Antimicrobials: Anti-infectives (From admission, onward)   Start     Stop   03/19/19 2100  remdesivir 100 mg in sodium chloride 0.9 % 250 mL IVPB     03/23/19 2059   03/18/19 2100  remdesivir 200 mg in sodium chloride 0.9 % 250 mL IVPB     03/18/19 2351       Devices    LINES / TUBES:      Continuous Infusions:  remdesivir 100 mg in NS 250 mL       Objective: Vitals:   03/18/19 1852 03/18/19 1917 03/18/19 2310 03/19/19 0612  BP: (!) 143/95 (!) 150/46 (!) 151/44 (!) 152/50  Pulse: 60 (!) 56 (!) 55 (!) 54  Resp: _0 Temp:  99 F (37.2 C)  98 F (36.7 C)  TempSrc:  Oral  Oral  SpO2: 96% 100% 100%   Weight:  81.9 kg    Height:  _1  (1.626 m)      Intake/Output Summary (Last 24 hours) at 03/19/2019 0731 Last data filed at 03/19/2019 0600 Gross per 24 hour  Intake 500 ml  Output 600 ml  Net -100 ml   Filed Weights   03/18/19 0753 03/18/19 1917  Weight: 81.6 kg 81.9 kg    Examination:  General: A/O x4, positive acute respiratory distress Eyes: negative scleral hemorrhage, negative anisocoria, negative icterus ENT: Negative  Runny nose, negative gingival bleeding, Neck:  Negative scars, masses, torticollis, lymphadenopathy, JVD Lungs: Clear to auscultation bilaterally without wheezes or crackles Cardiovascular: Regular rate and rhythm without murmur gallop or rub normal S1 and S2 Abdomen: negative abdominal pain, nondistended, positive soft, bowel sounds, no rebound, no ascites, no appreciable mass Extremities: No significant cyanosis, clubbing, or edema bilateral lower extremities Skin: Negative rashes, lesions, ulcers Psychiatric:  Negative depression, negative anxiety, negative fatigue, negative mania  Central nervous system:  Cranial nerves II through XII intact, tongue/uvula midline, all extremities muscle strength 5/5, sensation intact throughout, negative dysarthria, negative expressive aphasia, negative receptive aphasia.  .     Data Reviewed: Care during the described time interval was provided by me .  I have reviewed this patient's available data, including medical history, events of note, physical examination, and all test results as part of my evaluation.   CBC: Recent Labs  Lab 03/16/19 1017 03/18/19 0847  03/19/19 0253  WBC 6.2 8.9 5.4  NEUTROABS 4.3 7.6 4.3  HGB 7.8* 7.7* 6.4*  HCT 24.2* 23.7* 19.4*  MCV 93.4 92.2 90.7  PLT 140* 146* 841*   Basic Metabolic Panel: Recent Labs  Lab 03/16/19 1017 03/18/19 0847 03/19/19 0253  NA 139 137 135  K 4.4 4.0 4.0  CL 106 104 104  CO2 19* 20* 20*  GLUCOSE 134* 172* 324*  BUN 53* 57* 72*  CREATININE 2.04* 2.16* 2.38*  CALCIUM 9.6 9.5 8.7*   GFR: Estimated Creatinine Clearance: 24.7 mL/min (A) (by C-G formula based on SCr of 2.38 mg/dL (H)). Liver Function Tests: Recent Labs  Lab 03/18/19 0847 03/19/19 0253  AST 32 24  ALT 18 13  ALKPHOS 96 76  BILITOT 1.0 0.8  PROT 8.3* 7.0  ALBUMIN 4.2 3.4*   No results for input(s): LIPASE, AMYLASE in the last 168 hours. No results for input(s): AMMONIA in the last 168 hours. Coagulation  Profile: Recent Labs  Lab 03/16/19 1125  INR 1.1   Cardiac Enzymes: No results for input(s): CKTOTAL, CKMB, CKMBINDEX, TROPONINI in the last 168 hours. BNP (last 3 results) No results for input(s): PROBNP in the last 8760 hours. HbA1C: No results for input(s): HGBA1C in the last 72 hours. CBG: Recent Labs  Lab 03/16/19 1016 03/18/19 2308  GLUCAP 143* 398*   Lipid Profile: Recent Labs    03/18/19 0928  TRIG 70   Thyroid Function Tests: No results for input(s): TSH, T4TOTAL, FREET4, T3FREE, THYROIDAB in the last 72 hours. Anemia Panel: Recent Labs    03/18/19 0928 03/19/19 0253  FERRITIN 2,115* 2,251*   Urine analysis:    Component Value Date/Time   COLORURINE YELLOW 03/18/2019 1337   APPEARANCEUR CLEAR 03/18/2019 1337   LABSPEC 1.009 03/18/2019 1337   PHURINE 5.0 03/18/2019 1337   GLUCOSEU NEGATIVE 03/18/2019 1337   HGBUR NEGATIVE 03/18/2019 1337   BILIRUBINUR NEGATIVE 03/18/2019 1337   KETONESUR NEGATIVE 03/18/2019 1337   PROTEINUR 100 (A) 03/18/2019 1337   NITRITE NEGATIVE 03/18/2019 1337   LEUKOCYTESUR NEGATIVE 03/18/2019 1337   Sepsis Labs: _0 (procalcitonin:4,lacticidven:4)  ) Recent Results (from the past 240 hour(s))  SARS Coronavirus 2 [facility] order, Performed in [facility] hospital lab) Nasopharyngeal Nasopharyngeal Swab     Status: Abnormal   Collection Time: 03/18/19  9:39 AM   Specimen: Nasopharyngeal Swab  Result Value Ref Range Status   SARS Coronavirus 2 POSITIVE (A) NEGATIVE Final    Comment: RESULT CALLED TO, READ BACK BY AND VERIFIED WITH: Bonita FAWZE,PA [id number] @ [place] (NOTE) If result is NEGATIVE SARS-CoV-2 target nucleic acids are NOT DETECTED. The SARS-CoV-2 RNA is generally detectable in upper and lower  respiratory specimens during the acute phase of infection. The lowest  concentration of SARS-CoV-2 viral copies this assay can detect is 250  copies / mL. A negative result does not preclude SARS-CoV-2  infection  and should not be used as the sole basis for treatment or other  patient management decisions.  A negative result may occur with  improper specimen collection / handling, submission of specimen other  than nasopharyngeal swab, presence of viral mutation(s) within the  areas targeted by this assay, and inadequate number of viral copies  (<250 copies / mL). A negative result must be combined with clinical  observations, patient history, and epidemiological information. If result is POSITIVE SARS-CoV-2 target nucleic acids are DETECTE D. The SARS-CoV-2 RNA is generally detectable in upper and lower  respiratory specimens during  the acute phase of infection.  Positive  results are indicative of active infection with SARS-CoV-2.  Clinical  correlation with patient history and other diagnostic information is  necessary to determine patient infection status.  Positive results do  not rule out bacterial infection or co-infection with other viruses. If result is PRESUMPTIVE POSTIVE SARS-CoV-2 nucleic acids MAY BE PRESENT.   A presumptive positive result was obtained on the submitted specimen  and confirmed on repeat testing.  While 2019 novel coronavirus  (SARS-CoV-2) nucleic acids may be present in the submitted sample  additional confirmatory testing may be necessary for epidemiological  and / or clinical management purposes  to differentiate between  SARS-CoV-2 and other Sarbecovirus currently known to infect humans.  If clinically indicated additional testing with an alternate test  methodology (LAB745 3) is advised. The SARS-CoV-2 RNA is generally  detectable in upper and lower respiratory specimens during the acute  phase of infection. The expected result is Negative. Fact Sheet for Patients:  StrictlyIdeas.no Fact Sheet for Healthcare Providers: BankingDealers.co.za This test is not yet approved or cleared by the [place]  FDA and has been authorized for detection and/or diagnosis of SARS-CoV-2 by FDA under an Emergency Use Authorization (EUA).  This EUA will remain in effect (meaning this test can be used) for the duration of the COVID-19 declaration under Section 564(b)(1) of the Act, 21 U.S.C. section 360bbb-3(b)(1), unless the authorization is terminated or revoked sooner. Performed at [facility] [place] [address] [place], [place] [address]          Radiology Studies: Ct Head Wo Contrast  Result Date: 03/18/2019 CLINICAL DATA:  Headache. EXAM: CT HEAD WITHOUT CONTRAST TECHNIQUE: Contiguous axial images were obtained from the base of the skull through the vertex without intravenous contrast. COMPARISON:  CT scan of May 20, 2018. FINDINGS: Brain: Mild chronic ischemic white matter disease is noted. No mass effect or midline shift is noted. Ventricular size is within normal limits. There is no evidence of mass lesion, hemorrhage or acute infarction. Vascular: No hyperdense vessel or unexpected calcification. Skull: Normal. Negative for fracture or focal lesion. Sinuses/Orbits: No acute finding. Other: None. IMPRESSION: Mild chronic ischemic white matter disease. No acute intracranial abnormality seen. Electronically Signed   By: Marijo Conception M.D.   On: 03/18/2019 08:45   Dg Chest Port 1 View  Result Date: 03/18/2019 CLINICAL DATA:  63 year old female with sudden onset of headache, migraines, and weakness. EXAM: PORTABLE CHEST 1 VIEW COMPARISON:  Chest radiograph dated 02/15/2019 FINDINGS: There has been interval removal of the Port-A-Cath. Bilateral perihilar vascular prominence likely mild vascular congestion. An area of increased density in the left upper lung field/left suprahilar region may represent developing infiltrate. No pleural effusion or pneumothorax. Stable cardiomegaly. Atherosclerotic calcification of the aortic arch. No acute osseous pathology. IMPRESSION: Left upper  lung field opacity. Electronically Signed   By: Anner Crete M.D.   On: 03/18/2019 09:26        Scheduled Meds:  sodium chloride   Intravenous Once   albuterol  2 puff Inhalation Q6H   amiodarone  200 mg Oral Daily   amLODipine  10 mg Oral Daily   atorvastatin  40 mg Oral Daily   dexamethasone (DECADRON) injection  6 mg Intravenous Q24H   docusate sodium  100 mg Oral BID   furosemide  40 mg Intravenous Once   [START ON 03/20/2019] furosemide  40 mg Oral Daily   heparin  5,000 Units Subcutaneous Q8H  hydrALAZINE  100 mg Oral TID   insulin aspart  0-5 Units Subcutaneous QHS   insulin aspart  0-9 Units Subcutaneous TID WC   latanoprost  1 drop Both Eyes QHS   levothyroxine  50 mcg Oral Q0600   metoprolol succinate  25 mg Oral Daily   sildenafil  20 mg Oral TID   vitamin C  500 mg Oral Daily   zinc sulfate  220 mg Oral Daily   Continuous Infusions:  remdesivir 100 mg in NS 250 mL       LOS: 1 day   The patient is critically ill with multiple organ systems failure and requires high complexity decision making for assessment and support, frequent evaluation and titration of therapies, application of advanced monitoring technologies and extensive interpretation of multiple databases. Critical Care Time devoted to patient care services described in this note  Time spent: 40 minutes     Ayce Pietrzyk, Geraldo Docker, MD [facility] Pager [phone]  If 7PM-7AM, please contact night-coverage [email or web address] Password [facility] 03/19/2019, 7:31 AM

## 2019-03-19 NOTE — Progress Notes (Signed)
On call provider notified r/t critical lab of hgb  6.8

## 2019-03-19 NOTE — Plan of Care (Signed)
Pt with HGB 6.4.  Looks like she has chronic severe anemia for many months with baseline right around 7.  With that said, it does look like she gets transfused if HGB is below 7 (last transfused 1u PRBC by Dr. Osker Mason on 8/18 for HGB of 6.9).  No evidence of GIB at this time (no BMs since arrival actually).  Will order 1u PRBC transfusion, to be transfused as slowly as possible (in attempt to avoid / minimize overload risk). Will order 40mg  IV lasix to be given during transfusion, holding her 40mg  PO lasix that was scheduled to be given later this morning since we are giving IV.  Watch closely for s/sx of TACO, patient does have h/o CHF / [place].

## 2019-03-19 NOTE — Progress Notes (Signed)
CRITICAL VALUE ALERT  Critical Value:  Hemoglobin 6.4  Date & Time Notied:  03/19/19 0455 Received critical from lab. Primary RN Candace notified.

## 2019-03-19 NOTE — Progress Notes (Signed)
BGL 422.  Patient on Q4H resistant SSI but she is eating today.  Will convert SSI to resistant scale AC with HS coverage to start tomorrow.  Give 10u novolog for BGL 422 now (shes already eaten dinner).  Repeat CBG in 1h.

## 2019-03-20 DIAGNOSIS — E783 Hyperchylomicronemia: Secondary | ICD-10-CM

## 2019-03-20 DIAGNOSIS — N183 Chronic kidney disease, stage 3 (moderate): Secondary | ICD-10-CM

## 2019-03-20 DIAGNOSIS — E039 Hypothyroidism, unspecified: Secondary | ICD-10-CM

## 2019-03-20 LAB — COMPREHENSIVE METABOLIC PANEL
ALT: 19 U/L (ref 0–44)
AST: 31 U/L (ref 15–41)
Albumin: 3.6 g/dL (ref 3.5–5.0)
Alkaline Phosphatase: 78 U/L (ref 38–126)
Anion gap: 12 (ref 5–15)
BUN: 79 mg/dL — ABNORMAL HIGH (ref 8–23)
CO2: 19 mmol/L — ABNORMAL LOW (ref 22–32)
Calcium: 9.4 mg/dL (ref 8.9–10.3)
Chloride: 103 mmol/L (ref 98–111)
Creatinine, Ser: 2.22 mg/dL — ABNORMAL HIGH (ref 0.44–1.00)
GFR calc Af Amer: 26 mL/min — ABNORMAL LOW (ref >60)
GFR calc non Af Amer: 23 mL/min — ABNORMAL LOW (ref >60)
Glucose, Bld: 316 mg/dL — ABNORMAL HIGH (ref 70–99)
Potassium: 4.6 mmol/L (ref 3.5–5.1)
Sodium: 134 mmol/L — ABNORMAL LOW (ref 135–145)
Total Bilirubin: 0.7 mg/dL (ref 0.3–1.2)
Total Protein: 7.3 g/dL (ref 6.5–8.1)

## 2019-03-20 LAB — CBC WITH DIFFERENTIAL/PLATELET
Abs Immature Granulocytes: 0.11 10*3/uL — ABNORMAL HIGH (ref 0.00–0.07)
Basophils Absolute: 0 10*3/uL (ref 0.0–0.1)
Basophils Relative: 0 %
Eosinophils Absolute: 0 10*3/uL (ref 0.0–0.5)
Eosinophils Relative: 0 %
HCT: 23 % — ABNORMAL LOW (ref 36.0–46.0)
Hemoglobin: 7.8 g/dL — ABNORMAL LOW (ref 12.0–15.0)
Immature Granulocytes: 1 %
Lymphocytes Relative: 4 %
Lymphs Abs: 0.3 10*3/uL — ABNORMAL LOW (ref 0.7–4.0)
MCH: 30.4 pg (ref 26.0–34.0)
MCHC: 33.9 g/dL (ref 30.0–36.0)
MCV: 89.5 fL (ref 80.0–100.0)
Monocytes Absolute: 0.7 10*3/uL (ref 0.1–1.0)
Monocytes Relative: 9 %
Neutro Abs: 7.2 10*3/uL (ref 1.7–7.7)
Neutrophils Relative %: 86 %
Platelets: 132 10*3/uL — ABNORMAL LOW (ref 150–400)
RBC: 2.57 MIL/uL — ABNORMAL LOW (ref 3.87–5.11)
RDW: 17.6 % — ABNORMAL HIGH (ref 11.5–15.5)
WBC: 8.4 10*3/uL (ref 4.0–10.5)
nRBC: 0.5 % — ABNORMAL HIGH (ref 0.0–0.2)

## 2019-03-20 LAB — LIPID PANEL
Cholesterol: 143 mg/dL (ref 0–200)
HDL: 48 mg/dL (ref >40)
LDL Cholesterol: 86 mg/dL (ref 0–99)
Total CHOL/HDL Ratio: 3 RATIO
Triglycerides: 43 mg/dL (ref <150)
VLDL: 9 mg/dL (ref 0–40)

## 2019-03-20 LAB — MAGNESIUM: Magnesium: 1.9 mg/dL (ref 1.7–2.4)

## 2019-03-20 LAB — D-DIMER, QUANTITATIVE: D-Dimer, Quant: 0.87 ug/mL-FEU — ABNORMAL HIGH (ref 0.00–0.50)

## 2019-03-20 LAB — HEPATITIS PANEL, ACUTE
HCV Ab: <0.1 s/co ratio (ref 0.0–0.9)
Hep A IgM: NEGATIVE
Hep B C IgM: NEGATIVE
Hepatitis B Surface Ag: NEGATIVE

## 2019-03-20 LAB — GLUCOSE, CAPILLARY
Glucose-Capillary: 241 mg/dL — ABNORMAL HIGH (ref 70–99)
Glucose-Capillary: 274 mg/dL — ABNORMAL HIGH (ref 70–99)
Glucose-Capillary: 241 mg/dL — ABNORMAL HIGH (ref 70–99)
Glucose-Capillary: 262 mg/dL — ABNORMAL HIGH (ref 70–99)

## 2019-03-20 LAB — HEMOGLOBIN A1C
Hgb A1c MFr Bld: 6.7 % — ABNORMAL HIGH (ref 4.8–5.6)
Mean Plasma Glucose: 145.59 mg/dL

## 2019-03-20 LAB — FERRITIN: Ferritin: 2172 ng/mL — ABNORMAL HIGH (ref 11–307)

## 2019-03-20 LAB — PHOSPHORUS: Phosphorus: 3.5 mg/dL (ref 2.5–4.6)

## 2019-03-20 LAB — C-REACTIVE PROTEIN: CRP: 6.6 mg/dL — ABNORMAL HIGH (ref <1.0)

## 2019-03-20 MED ORDER — FUROSEMIDE 10 MG/ML IJ SOLN
40.0000 mg | Freq: Every day | INTRAMUSCULAR | Status: DC
  Administered 2019-03-20 – 2019-03-23 (×4): 40 mg via INTRAVENOUS
  Filled 2019-03-20 (×4): qty 4

## 2019-03-20 MED ORDER — ATORVASTATIN CALCIUM 40 MG PO TABS
60.0000 mg | ORAL_TABLET | Freq: Every day | ORAL | Status: DC
  Administered 2019-03-21 – 2019-03-23 (×3): 60 mg via ORAL
  Filled 2019-03-20 (×3): qty 2

## 2019-03-20 MED ORDER — LIP MEDEX EX OINT
TOPICAL_OINTMENT | CUTANEOUS | Status: DC | PRN
  Administered 2019-03-21: 1 via TOPICAL
  Filled 2019-03-20: qty 7

## 2019-03-20 MED ORDER — AMIODARONE HCL 100 MG PO TABS
100.0000 mg | ORAL_TABLET | Freq: Once | ORAL | Status: AC
  Administered 2019-03-20: 100 mg via ORAL
  Filled 2019-03-20: qty 1

## 2019-03-20 MED ORDER — AMIODARONE HCL 100 MG PO TABS
300.0000 mg | ORAL_TABLET | Freq: Every day | ORAL | Status: DC
  Administered 2019-03-21 – 2019-03-23 (×3): 300 mg via ORAL
  Filled 2019-03-20 (×3): qty 3

## 2019-03-20 NOTE — Progress Notes (Addendum)
PROGRESS NOTE    Patricia Mata  WCB:[id number] DOB: 10/03/1955 DOA: 03/18/2019 PCP: [name], FNP   Brief Narrative:  Patricia Mata is a 63 y.o. BF PMHx  DM-2, CKD4 severe aortic stenosis, diastolic CHF, A. fib, colon cancer, anemia of chronic disease and hypothyroidism   Presenting with multiple complaints including fever, chills, body ache, headache and cough. Patient was in his usual state of health until last night when she suddenly started having headache followed by fever, chills, generalized body pain and headache.  Her husband was recently hospitalized with COVID about 3 weeks ago and discharge home.  She said she was tested for COVID yesterday but has not had a result back.  She denies chest pain, nausea, vomiting, abdominal pain, diarrhea or UTI symptoms.  She denies neck stiffness, photophobia or phonophobia.  Lives with her husband.  Denies smoking cigarettes, drinking alcohol recreational drug use.  In ED, initially desaturated to 89% but recovered to 95% on 4 L.  However, she continued to desaturate to mid 80s and finally requiring 10 L by HFNC.  Creatinine 2.16 (baseline).  BUN 57.  Glucose 172.  Hgb 7.7 (baseline).  Platelet 146.  LDH 2 111.  Ferritin 2115.  CRP 1.9.  Lactic acid negative.  Procalcitonin 0.18.  D-dimer 1.36.  Fibrinogen 499.  COVID-19 positive.  UA not impressive.  CT head without contrast without acute finding.  Portable CXR with LUL opacity concerning for pneumonia.  EKG normal sinus rhythm without acute finding.  Patient was given Tylenol and hospital service was called for admission for COVID-19 infection.    Subjective: 9/5/O x4, negative S OB, negative abdominal pain, negative CP, negative N/V   Assessment & Plan:   Active Problems:   Anemia in chronic kidney disease   MDS (myelodysplastic syndrome) (HCC)   Essential hypertension   Hyperlipidemia   Severe aortic stenosis   Carotid artery disease (HCC)   Atrial fibrillation with rapid  ventricular response (HCC)   Acute hypoxemic respiratory failure (HCC)   Gout   Type 2 diabetes mellitus without complication, without long-term current use of insulin (HCC)   Hypothyroidism   Pneumonia due to COVID-19 virus  Acute respiratory failure with hypoxia/COVID-19 pneumonia Recent Labs  Lab 03/18/19 0928 03/19/19 0253 03/20/19 0406  CRP 1.9* 7.4* 6.6*   Recent Labs  Lab 03/18/19 0928 03/19/19 0253 03/20/19 0406  DDIMER 1.35* 1.00* 0.87*  -Upon admission patient with new onset O2 demand and PCXR showing opacification met criteria for Remdesivir. -Remdesivir.  Per pharmacy protocol - Decadron 6 mg daily -Does not meet criteria for Actemra at this time - Vitamins per COVID protocol - Combivent QID   Chronic diastolic CHF/pulmonary HTN -Strict in and out -1.1 L -Daily weight  Filed Weights   03/18/19 0753 03/18/19 1917 03/19/19 1114  Weight: 81.6 kg 81.9 kg 83.8 kg  -9/5 increase Amiodarone 300 mg daily  - Amlodipine 10 mg daily - 9/5 restart Lasix 40 mg daily  - Hydralazine 100 mg TID -Toprol 25 mg daily (hold)  Severe aortic stenosis - Currently asymptomatic - Schedule follow-up appointment in 1 to 2 weeks with Dr. Glori Bickers CHF clinic.  Chronic diastolic CHF, pulmonary HTN, aortic stenosis  Essential HTN - See heart failure  Paroxysmal atrial fibrillation -CHADS2VASc=3 -Currently NSR - See CHF - Although bradycardic on home medication may have to restart her Toprol if she reverts to A. Fib. -Unsure why patient is not on anticoagulant.  Will need to discuss with her risk and  benefits of starting anticoagulant for her A. fib  CKD stage IV (baseline Cr ~2.4) Recent Labs  Lab 03/16/19 1017 03/18/19 0847 03/19/19 0253 03/20/19 0406  CREATININE 2.04* 2.16* 2.38* 2.22*  -Currently at baseline  Anemia of chronic disease (baseline hemoglobin 7) - Patient transfused if hemoglobin<7 - 9/4 transfuse 1 unit PRBC Recent Labs  Lab 03/16/19 1017  03/18/19 0847 03/19/19 0253 03/20/19 0406  HGB 7.8* 7.7* 6.4* 7.8*   Diabetes type 2 controlled without complication - 5/36 hemoglobin A1c= 5.8 - Resistant SSI  HLD -LDL not within ADA/AHA guidelines -9/5 start Lipitor 60 mg daily  Hypothyroidism - 7/6 TSH= 50 - Recheck TSH - Levothyroxine 50 mcg daily  Thrombocytopenia (baseline 150-200) - Slightly low most likely secondary to infection, will continue to follow    DVT prophylaxis: Subcu heparin Code Status: Full Family Communication: 9/5 discussed plan of care with spouse  Answered all questions. Disposition Plan: TBD   Consultants:  None    Procedures/Significant Events:  9/3 PCXR: Left upper lung field opacification 9/3 CT head  Wo contrast: Negative acute intracranial abnormality 9/4 transfuse 1 unit PRBC    I have personally reviewed and interpreted all radiology studies and my findings are as above.  VENTILATOR SETTINGS:    Cultures 9/3 blood LEFT arm NGTD  9/3 urine positive multiple bacterial morphotypes present 9/3 SARS coronavirus positive    Antimicrobials: Anti-infectives (From admission, onward)   Start     Stop   03/19/19 2100  remdesivir 100 mg in sodium chloride 0.9 % 250 mL IVPB     03/23/19 2059   03/18/19 2100  remdesivir 200 mg in sodium chloride 0.9 % 250 mL IVPB     03/18/19 2351       Devices    LINES / TUBES:      Continuous Infusions: . remdesivir 100 mg in NS 250 mL Stopped (03/19/19 2223)     Objective: Vitals:   03/20/19 0400 03/20/19 0500 03/20/19 0600 03/20/19 0923  BP:    (!) 183/49  Pulse: (!) 54 (!) 57 (!) 57   Resp: '14 10 17   ' Temp:      TempSrc:      SpO2: 96% 100% 94%   Weight:      Height:        Intake/Output Summary (Last 24 hours) at 03/20/2019 1033 Last data filed at 03/20/2019 0600 Gross per 24 hour  Intake 1295 ml  Output 1925 ml  Net -630 ml   Filed Weights   03/18/19 0753 03/18/19 1917 03/19/19 1114  Weight: 81.6 kg 81.9 kg  83.8 kg   Physical Exam:  General: A/O x4, negative acute respiratory distress Eyes: negative scleral hemorrhage, negative anisocoria, negative icterus ENT: Negative Runny nose, negative gingival bleeding, Neck:  Negative scars, masses, torticollis, lymphadenopathy, JVD Lungs: Clear to auscultation bilaterally without wheezes or crackles Cardiovascular: Regular rate and rhythm without murmur gallop or rub normal S1 and S2 Abdomen: negative abdominal pain, nondistended, positive soft, bowel sounds, no rebound, no ascites, no appreciable mass Extremities: No significant cyanosis, clubbing, or edema bilateral lower extremities Skin: Negative rashes, lesions, ulcers Psychiatric:  Negative depression, negative anxiety, negative fatigue, negative mania  Central nervous system:  Cranial nerves II through XII intact, tongue/uvula midline, all extremities muscle strength 5/5, sensation intact throughout, negative dysarthria, negative expressive aphasia, negative receptive aphasia.  .     Data Reviewed: Care during the described time interval was provided by me .  I have reviewed this  patient's available data, including medical history, events of note, physical examination, and all test results as part of my evaluation.   CBC: Recent Labs  Lab 03/16/19 1017 03/18/19 0847 03/19/19 0253 03/20/19 0406  WBC 6.2 8.9 5.4 8.4  NEUTROABS 4.3 7.6 4.3 7.2  HGB 7.8* 7.7* 6.4* 7.8*  HCT 24.2* 23.7* 19.4* 23.0*  MCV 93.4 92.2 90.7 89.5  PLT 140* 146* 122* 834*   Basic Metabolic Panel: Recent Labs  Lab 03/16/19 1017 03/18/19 0847 03/19/19 0253 03/20/19 0406  NA 139 137 135 134*  K 4.4 4.0 4.0 4.6  CL 106 104 104 103  CO2 19* 20* 20* 19*  GLUCOSE 134* 172* 324* 316*  BUN 53* 57* 72* 79*  CREATININE 2.04* 2.16* 2.38* 2.22*  CALCIUM 9.6 9.5 8.7* 9.4  MG  --   --   --  1.9  PHOS  --   --   --  3.5   GFR: Estimated Creatinine Clearance: 26.8 mL/min (A) (by C-G formula based on SCr of 2.22  mg/dL (H)). Liver Function Tests: Recent Labs  Lab 03/18/19 0847 03/19/19 0253 03/20/19 0406  AST 32 24 31  ALT '18 13 19  ' ALKPHOS 96 76 78  BILITOT 1.0 0.8 0.7  PROT 8.3* 7.0 7.3  ALBUMIN 4.2 3.4* 3.6   No results for input(s): LIPASE, AMYLASE in the last 168 hours. No results for input(s): AMMONIA in the last 168 hours. Coagulation Profile: Recent Labs  Lab 03/16/19 1125  INR 1.1   Cardiac Enzymes: No results for input(s): CKTOTAL, CKMB, CKMBINDEX, TROPONINI in the last 168 hours. BNP (last 3 results) No results for input(s): PROBNP in the last 8760 hours. HbA1C: Recent Labs    03/19/19 0253 03/20/19 0406  HGBA1C 6.8* 6.7*   CBG: Recent Labs  Lab 03/18/19 2308 03/19/19 1243 03/19/19 1613 03/19/19 2056 03/20/19 0814  GLUCAP 398* 189* 246* 422* 241*   Lipid Profile: Recent Labs    03/18/19 0928 03/20/19 0406  CHOL  --  143  HDL  --  48  LDLCALC  --  86  TRIG 70 43  CHOLHDL  --  3.0   Thyroid Function Tests: No results for input(s): TSH, T4TOTAL, FREET4, T3FREE, THYROIDAB in the last 72 hours. Anemia Panel: Recent Labs    03/19/19 0253 03/20/19 0406  FERRITIN 2,251* 2,172*   Urine analysis:    Component Value Date/Time   COLORURINE YELLOW 03/18/2019 1337   APPEARANCEUR CLEAR 03/18/2019 1337   LABSPEC 1.009 03/18/2019 1337   PHURINE 5.0 03/18/2019 1337   GLUCOSEU NEGATIVE 03/18/2019 1337   HGBUR NEGATIVE 03/18/2019 1337   BILIRUBINUR NEGATIVE 03/18/2019 1337   KETONESUR NEGATIVE 03/18/2019 1337   PROTEINUR 100 (A) 03/18/2019 1337   NITRITE NEGATIVE 03/18/2019 1337   LEUKOCYTESUR NEGATIVE 03/18/2019 1337   Sepsis Labs: '@LABRCNTIP' (procalcitonin:4,lacticidven:4)  ) Recent Results (from the past 240 hour(s))  Novel Coronavirus, NAA ([facility])     Status: None   Collection Time: 03/17/19 12:00 AM   Specimen: Oropharyngeal(OP) collection in vial transport medium   OROPHARYNGEA  TESTING  Result Value Ref Range Status   SARS-CoV-2, NAA Not  Detected Not Detected Final    Comment: This nucleic acid amplification test was developed and its performance characteristics determined by [employer]. Nucleic acid amplification tests include PCR and TMA. This test has not been FDA cleared or approved. This test has been authorized by FDA under an Emergency Use Authorization (EUA). This test is only authorized for the duration of time the  declaration that circumstances exist justifying the authorization of the emergency use of in vitro diagnostic tests for detection of SARS-CoV-2 virus and/or diagnosis of COVID-19 infection under section 564(b)(1) of the Act, 21 U.S.C. 390ZES-9(Q) (1), unless the authorization is terminated or revoked sooner. When diagnostic testing is negative, the possibility of a false negative result should be considered in the context of a patient's recent exposures and the presence of clinical signs and symptoms consistent with COVID-19. An individual without symptoms of COVID-19 and who is not shedding SARS-CoV-2 virus would  expect to have a negative (not detected) result in this assay.   SARS Coronavirus 2 [facility] order, Performed in [facility] hospital lab) Nasopharyngeal Nasopharyngeal Swab     Status: Abnormal   Collection Time: 03/18/19  9:39 AM   Specimen: Nasopharyngeal Swab  Result Value Ref Range Status   SARS Coronavirus 2 POSITIVE (A) NEGATIVE Final    Comment: RESULT CALLED TO, READ BACK BY AND VERIFIED WITH: Aurora FAWZE,PA [id number] @ 2263 BY J SCOTTON (NOTE) If result is NEGATIVE SARS-CoV-2 target nucleic acids are NOT DETECTED. The SARS-CoV-2 RNA is generally detectable in upper and lower  respiratory specimens during the acute phase of infection. The lowest  concentration of SARS-CoV-2 viral copies this assay can detect is 250  copies / mL. A negative result does not preclude SARS-CoV-2 infection  and should not be used as the sole basis for treatment or other  patient management  decisions.  A negative result may occur with  improper specimen collection / handling, submission of specimen other  than nasopharyngeal swab, presence of viral mutation(s) within the  areas targeted by this assay, and inadequate number of viral copies  (<250 copies / mL). A negative result must be combined with clinical  observations, patient history, and epidemiological information. If result is POSITIVE SARS-CoV-2 target nucleic acids are DETECTE D. The SARS-CoV-2 RNA is generally detectable in upper and lower  respiratory specimens during the acute phase of infection.  Positive  results are indicative of active infection with SARS-CoV-2.  Clinical  correlation with patient history and other diagnostic information is  necessary to determine patient infection status.  Positive results do  not rule out bacterial infection or co-infection with other viruses. If result is PRESUMPTIVE POSTIVE SARS-CoV-2 nucleic acids MAY BE PRESENT.   A presumptive positive result was obtained on the submitted specimen  and confirmed on repeat testing.  While 2019 novel coronavirus  (SARS-CoV-2) nucleic acids may be present in the submitted sample  additional confirmatory testing may be necessary for epidemiological  and / or clinical management purposes  to differentiate between  SARS-CoV-2 and other Sarbecovirus currently known to infect humans.  If clinically indicated additional testing with an alternate test  methodology (LAB745 3) is advised. The SARS-CoV-2 RNA is generally  detectable in upper and lower respiratory specimens during the acute  phase of infection. The expected result is Negative. Fact Sheet for Patients:  StrictlyIdeas.no Fact Sheet for Healthcare Providers: BankingDealers.co.za This test is not yet approved or cleared by the [place] FDA and has been authorized for detection and/or diagnosis of SARS-CoV-2 by FDA under an  Emergency Use Authorization (EUA).  This EUA will remain in effect (meaning this test can be used) for the duration of the COVID-19 declaration under Section 564(b)(1) of the Act, 21 U.S.C. section 360bbb-3(b)(1), unless the authorization is terminated or revoked sooner. Performed at [facility], [place] [address], [place], [place] [address]   Blood Culture (  routine x 2)     Status: None (Preliminary result)   Collection Time: 03/18/19  1:03 PM   Specimen: BLOOD  Result Value Ref Range Status   Specimen Description   Final    BLOOD LEFT ARM Performed at [place] [address] [place], [place] [address]    Special Requests   Final    BOTTLES DRAWN AEROBIC AND ANAEROBIC Blood Culture adequate volume Performed at [place] [address] [place], [place] [address]    Culture   Final    NO GROWTH 1 DAY Performed at [facility], [place] [address], [place], [place] [address]    Report Status PENDING  Incomplete  Urine culture     Status: None   Collection Time: 03/18/19  1:37 PM   Specimen: Urine, Random  Result Value Ref Range Status   Specimen Description   Final    URINE, RANDOM Performed at [place] [address] [place], [place] [address]    Special Requests   Final    NONE Performed at [facility], [place] [address], [place], [place] [address]    Culture   Final    Multiple bacterial morphotypes present, none predominant. Suggest appropriate recollection if clinically indicated.   Report Status 03/19/2019 FINAL  Final         Radiology Studies: No results found.      Scheduled Meds: . sodium chloride   Intravenous Once  . amiodarone  200 mg Oral Daily  . amLODipine  10 mg Oral Daily  . atorvastatin  40 mg Oral Daily  . dexamethasone (DECADRON) injection  6 mg Intravenous Q24H  . docusate sodium  100 mg Oral BID  . heparin  5,000 Units  Subcutaneous Q8H  . hydrALAZINE  100 mg Oral TID  . insulin aspart  0-20 Units Subcutaneous TID WC  . insulin aspart  0-5 Units Subcutaneous QHS  . Ipratropium-Albuterol  1 puff Inhalation Q6H  . latanoprost  1 drop Both Eyes QHS  . levothyroxine  50 mcg Oral Q0600  . sildenafil  20 mg Oral TID  . vitamin C  500 mg Oral Daily  . zinc sulfate  220 mg Oral Daily   Continuous Infusions: . remdesivir 100 mg in NS 250 mL Stopped (03/19/19 2223)     LOS: 2 days   The patient is critically ill with multiple organ systems failure and requires high complexity decision making for assessment and support, frequent evaluation and titration of therapies, application of advanced monitoring technologies and extensive interpretation of multiple databases. Critical Care Time devoted to patient care services described in this note  Time spent: 40 minutes     Vernal Hritz, Geraldo Docker, MD [facility] Pager [phone]  If 7PM-7AM, please contact night-coverage [email or web address] Password [email or web address] 03/20/2019, 10:33 AM

## 2019-03-21 DIAGNOSIS — R001 Bradycardia, unspecified: Secondary | ICD-10-CM

## 2019-03-21 LAB — CBC WITH DIFFERENTIAL/PLATELET
Abs Immature Granulocytes: 0.15 10*3/uL — ABNORMAL HIGH (ref 0.00–0.07)
Basophils Absolute: 0 10*3/uL (ref 0.0–0.1)
Basophils Relative: 0 %
Eosinophils Absolute: 0 10*3/uL (ref 0.0–0.5)
Eosinophils Relative: 0 %
HCT: 23.9 % — ABNORMAL LOW (ref 36.0–46.0)
Hemoglobin: 8.2 g/dL — ABNORMAL LOW (ref 12.0–15.0)
Immature Granulocytes: 2 %
Lymphocytes Relative: 7 %
Lymphs Abs: 0.5 10*3/uL — ABNORMAL LOW (ref 0.7–4.0)
MCH: 30.7 pg (ref 26.0–34.0)
MCHC: 34.3 g/dL (ref 30.0–36.0)
MCV: 89.5 fL (ref 80.0–100.0)
Monocytes Absolute: 0.9 10*3/uL (ref 0.1–1.0)
Monocytes Relative: 12 %
Neutro Abs: 5.6 10*3/uL (ref 1.7–7.7)
Neutrophils Relative %: 79 %
Platelets: 145 10*3/uL — ABNORMAL LOW (ref 150–400)
RBC: 2.67 MIL/uL — ABNORMAL LOW (ref 3.87–5.11)
RDW: 17.5 % — ABNORMAL HIGH (ref 11.5–15.5)
WBC: 7.2 10*3/uL (ref 4.0–10.5)
nRBC: 0.4 % — ABNORMAL HIGH (ref 0.0–0.2)

## 2019-03-21 LAB — FERRITIN: Ferritin: 2284 ng/mL — ABNORMAL HIGH (ref 11–307)

## 2019-03-21 LAB — COMPREHENSIVE METABOLIC PANEL
ALT: 22 U/L (ref 0–44)
AST: 32 U/L (ref 15–41)
Albumin: 3.3 g/dL — ABNORMAL LOW (ref 3.5–5.0)
Alkaline Phosphatase: 73 U/L (ref 38–126)
Anion gap: 12 (ref 5–15)
BUN: 77 mg/dL — ABNORMAL HIGH (ref 8–23)
CO2: 20 mmol/L — ABNORMAL LOW (ref 22–32)
Calcium: 9.4 mg/dL (ref 8.9–10.3)
Chloride: 101 mmol/L (ref 98–111)
Creatinine, Ser: 2.18 mg/dL — ABNORMAL HIGH (ref 0.44–1.00)
GFR calc Af Amer: 27 mL/min — ABNORMAL LOW (ref >60)
GFR calc non Af Amer: 23 mL/min — ABNORMAL LOW (ref >60)
Glucose, Bld: 279 mg/dL — ABNORMAL HIGH (ref 70–99)
Potassium: 4.3 mmol/L (ref 3.5–5.1)
Sodium: 133 mmol/L — ABNORMAL LOW (ref 135–145)
Total Bilirubin: 0.7 mg/dL (ref 0.3–1.2)
Total Protein: 7 g/dL (ref 6.5–8.1)

## 2019-03-21 LAB — TYPE AND SCREEN
ABO/RH(D): A NEG
Antibody Screen: NEGATIVE
Unit division: 0

## 2019-03-21 LAB — GLUCOSE, CAPILLARY
Glucose-Capillary: 207 mg/dL — ABNORMAL HIGH (ref 70–99)
Glucose-Capillary: 236 mg/dL — ABNORMAL HIGH (ref 70–99)
Glucose-Capillary: 158 mg/dL — ABNORMAL HIGH (ref 70–99)
Glucose-Capillary: 235 mg/dL — ABNORMAL HIGH (ref 70–99)
Glucose-Capillary: 321 mg/dL — ABNORMAL HIGH (ref 70–99)

## 2019-03-21 LAB — BPAM RBC
Blood Product Expiration Date: 202009272359
ISSUE DATE / TIME: 202009040906
Unit Type and Rh: 600

## 2019-03-21 LAB — PHOSPHORUS: Phosphorus: 3.3 mg/dL (ref 2.5–4.6)

## 2019-03-21 LAB — C-REACTIVE PROTEIN: CRP: 4.3 mg/dL — ABNORMAL HIGH (ref <1.0)

## 2019-03-21 LAB — MAGNESIUM: Magnesium: 1.8 mg/dL (ref 1.7–2.4)

## 2019-03-21 LAB — TSH: TSH: 5.048 u[IU]/mL — ABNORMAL HIGH (ref 0.350–4.500)

## 2019-03-21 LAB — D-DIMER, QUANTITATIVE: D-Dimer, Quant: 0.56 ug/mL-FEU — ABNORMAL HIGH (ref 0.00–0.50)

## 2019-03-21 MED ORDER — LEVOTHYROXINE SODIUM 75 MCG PO TABS
62.5000 ug | ORAL_TABLET | Freq: Every day | ORAL | Status: DC
  Filled 2019-03-21: qty 0.5

## 2019-03-21 MED ORDER — INSULIN GLARGINE 100 UNIT/ML ~~LOC~~ SOLN
8.0000 [IU] | SUBCUTANEOUS | Status: DC
  Administered 2019-03-21 – 2019-03-22 (×2): 8 [IU] via SUBCUTANEOUS
  Filled 2019-03-21 (×2): qty 0.08

## 2019-03-21 MED ORDER — CLONIDINE HCL 0.1 MG PO TABS
0.1000 mg | ORAL_TABLET | Freq: Two times a day (BID) | ORAL | Status: DC
  Administered 2019-03-21 – 2019-03-23 (×4): 0.1 mg via ORAL
  Filled 2019-03-21 (×4): qty 1

## 2019-03-21 NOTE — Plan of Care (Signed)
Pt currently resting in room, eyes open breathing normal with no s/s of distress noted. Pt family updated via patient call. No other concerns at this time.  Problem: Respiratory: Goal: Will maintain a patent airway Outcome: Progressing Goal: Complications related to the disease process, condition or treatment will be avoided or minimized Outcome: Progressing

## 2019-03-21 NOTE — Progress Notes (Signed)
Spoke with patients spouse [name].  Answered all questions and concerns.

## 2019-03-21 NOTE — Progress Notes (Signed)
PROGRESS NOTE    Patricia Mata  ZOX:[id number] DOB: May 09, 1956 DOA: 03/18/2019 PCP: [name], FNP   Brief Narrative:  Patricia Mata is a 64 y.o. BF PMHx  DM-2, CKD4 severe aortic stenosis, diastolic CHF, A. fib, colon cancer, anemia of chronic disease and hypothyroidism   Presenting with multiple complaints including fever, chills, body ache, headache and cough. Patient was in his usual state of health until last night when she suddenly started having headache followed by fever, chills, generalized body pain and headache.  Her husband was recently hospitalized with COVID about 3 weeks ago and discharge home.  She said she was tested for COVID yesterday but has not had a result back.  She denies chest pain, nausea, vomiting, abdominal pain, diarrhea or UTI symptoms.  She denies neck stiffness, photophobia or phonophobia.  Lives with her husband.  Denies smoking cigarettes, drinking alcohol recreational drug use.  In ED, initially desaturated to 89% but recovered to 95% on 4 L.  However, she continued to desaturate to mid 80s and finally requiring 10 L by HFNC.  Creatinine 2.16 (baseline).  BUN 57.  Glucose 172.  Hgb 7.7 (baseline).  Platelet 146.  LDH 2 111.  Ferritin 2115.  CRP 1.9.  Lactic acid negative.  Procalcitonin 0.18.  D-dimer 1.36.  Fibrinogen 499.  COVID-19 positive.  UA not impressive.  CT head without contrast without acute finding.  Portable CXR with LUL opacity concerning for pneumonia.  EKG normal sinus rhythm without acute finding.  Patient was given Tylenol and hospital service was called for admission for COVID-19 infection.    Subjective: 9/6 A/O x4, negative S OB, negative abdominal pain, negative CP, negative N/V   Assessment & Plan:   Active Problems:   Anemia in chronic kidney disease   MDS (myelodysplastic syndrome) (HCC)   Essential hypertension   Hyperlipidemia   Severe aortic stenosis   Carotid artery disease (HCC)   Atrial fibrillation with rapid  ventricular response (HCC)   Acute hypoxemic respiratory failure (HCC)   Gout   Type 2 diabetes mellitus without complication, without long-term current use of insulin ([place])   Hypothyroidism   Pneumonia due to COVID-19 virus   Bradycardia  Acute respiratory failure with hypoxia/COVID-19 pneumonia Recent Labs  Lab 03/18/19 0928 03/19/19 0253 03/20/19 0406 03/21/19 0303  CRP 1.9* 7.4* 6.6* 4.3*   Recent Labs  Lab 03/18/19 0928 03/19/19 0253 03/20/19 0406 03/21/19 0303  DDIMER 1.35* 1.00* 0.87* 0.56*  -Upon admission patient with new onset O2 demand and PCXR showing opacification met criteria for Remdesivir. -Remdesivir.  Per pharmacy protocol - Decadron 6 mg daily -Does not meet criteria for Actemra at this time - Vitamins per COVID protocol - Combivent QID  -Should be able to be discharged upon completion of Remdesivir  Chronic diastolic CHF/pulmonary HTN -Strict in and out -2.9 L -Daily weight  Filed Weights   03/18/19 1917 03/19/19 1114 03/21/19 0900  Weight: 81.9 kg 83.8 kg 81.8 kg  -9/5 increase Amiodarone 300 mg daily  - Amlodipine 10 mg daily - 9/5 restart Lasix 40 mg daily  - Hydralazine 100 mg TID -9/6 start clonidine 0.1 mg BID -Toprol 25 mg daily patient continues to be bradycardic (hold)  Severe aortic stenosis - Currently asymptomatic - Schedule follow-up appointment in 1 to 2 weeks with Dr. Glori Bickers CHF clinic.  Chronic diastolic CHF, pulmonary HTN, aortic stenosis  Essential HTN - See heart failure  Paroxysmal atrial fibrillation -CHADS2VASc=3 -Currently NSR - See CHF - Continue bradycardia,  currently NSR would not restart beta-blocker  -Unsure why patient is not on anticoagulant.  Will need to discuss with her risk and benefits of starting anticoagulant for her A. Fib  Bradycardia - See CHF  CKD stage IV (baseline Cr ~2.4) Recent Labs  Lab 03/16/19 1017 03/18/19 0847 03/19/19 0253 03/20/19 0406 03/21/19 0303  CREATININE 2.04*  2.16* 2.38* 2.22* 2.18*  -Currently at baseline  Anemia of chronic disease (baseline hemoglobin 7) - Patient transfused if hemoglobin<7 - 9/4 transfuse 1 unit PRBC Recent Labs  Lab 03/16/19 1017 03/18/19 0847 03/19/19 0253 03/20/19 0406 03/21/19 0303  HGB 7.8* 7.7* 6.4* 7.8* 8.2*   Diabetes type 2 controlled without complication - 1/16 hemoglobin A1c= 5.8 - Resistant SSI - 9/6 start Lantus 8 units daily  HLD -LDL not within ADA/AHA guidelines -9/5 start Lipitor 60 mg daily  Hypothyroidism - 7/6 TSH= 50 - 9/6 TSH 5.048 - 9/6 increase levothyroxine 62.5 mcg daily  Thrombocytopenia (baseline 150-200) - Trending up    DVT prophylaxis: Subcu heparin Code Status: Full Family Communication: 9/5 discussed plan of care with spouse  Answered all questions. Disposition Plan: TBD   Consultants:  None    Procedures/Significant Events:  9/3 PCXR: Left upper lung field opacification 9/3 CT head  Wo contrast: Negative acute intracranial abnormality 9/4 transfuse 1 unit PRBC    I have personally reviewed and interpreted all radiology studies and my findings are as above.  VENTILATOR SETTINGS:    Cultures 9/3 blood LEFT arm NGTD  9/3 urine positive multiple bacterial morphotypes present 9/3 SARS coronavirus positive    Antimicrobials: Anti-infectives (From admission, onward)   Start     Stop   03/19/19 2100  remdesivir 100 mg in sodium chloride 0.9 % 250 mL IVPB     03/23/19 2059   03/18/19 2100  remdesivir 200 mg in sodium chloride 0.9 % 250 mL IVPB     03/18/19 2351       Devices    LINES / TUBES:      Continuous Infusions: . remdesivir 100 mg in NS 250 mL 100 mg (03/20/19 2235)     Objective: Vitals:   03/21/19 0600 03/21/19 0800 03/21/19 0900 03/21/19 1605  BP:  (!) 175/51  (!) 146/42  Pulse: (!) 57 (!) 53 (!) 58 60  Resp: _0 Temp:      TempSrc:    Oral  SpO2: 99% 100% 99% 100%  Weight:   81.8 kg   Height:   _1  (1.626 m)      Intake/Output Summary (Last 24 hours) at 03/21/2019 1650 Last data filed at 03/21/2019 1600 Gross per 24 hour  Intake 850 ml  Output 2850 ml  Net -2000 ml   Filed Weights   03/18/19 1917 03/19/19 1114 03/21/19 0900  Weight: 81.9 kg 83.8 kg 81.8 kg   Physical Exam:  General: A/O x4, no acute respiratory distress Eyes: negative scleral hemorrhage, negative anisocoria, negative icterus ENT: Negative Runny nose, negative gingival bleeding, Neck:  Negative scars, masses, torticollis, lymphadenopathy, JVD Lungs: Clear to auscultation bilaterally without wheezes or crackles Cardiovascular: Regular rate and rhythm without murmur gallop or rub normal S1 and S2 Abdomen: negative abdominal pain, nondistended, positive soft, bowel sounds, no rebound, no ascites, no appreciable mass Extremities: No significant cyanosis, clubbing, or edema bilateral lower extremities Skin: Negative rashes, lesions, ulcers Psychiatric:  Negative depression, negative anxiety, negative fatigue, negative mania  Central nervous system:  Cranial nerves II through XII intact, tongue/uvula  midline, all extremities muscle strength 5/5, sensation intact throughout,negative dysarthria, negative expressive aphasia, negative receptive aphasia. .     Data Reviewed: Care during the described time interval was provided by me .  I have reviewed this patient's available data, including medical history, events of note, physical examination, and all test results as part of my evaluation.   CBC: Recent Labs  Lab 03/16/19 1017 03/18/19 0847 03/19/19 0253 03/20/19 0406 03/21/19 0303  WBC 6.2 8.9 5.4 8.4 7.2  NEUTROABS 4.3 7.6 4.3 7.2 5.6  HGB 7.8* 7.7* 6.4* 7.8* 8.2*  HCT 24.2* 23.7* 19.4* 23.0* 23.9*  MCV 93.4 92.2 90.7 89.5 89.5  PLT 140* 146* 122* 132* 366*   Basic Metabolic Panel: Recent Labs  Lab 03/16/19 1017 03/18/19 0847 03/19/19 0253 03/20/19 0406 03/21/19 0303  NA 139 137 135 134* 133*  K 4.4 4.0 4.0  4.6 4.3  CL 106 104 104 103 101  CO2 19* 20* 20* 19* 20*  GLUCOSE 134* 172* 324* 316* 279*  BUN 53* 57* 72* 79* 77*  CREATININE 2.04* 2.16* 2.38* 2.22* 2.18*  CALCIUM 9.6 9.5 8.7* 9.4 9.4  MG  --   --   --  1.9 1.8  PHOS  --   --   --  3.5 3.3   GFR: Estimated Creatinine Clearance: 27 mL/min (A) (by C-G formula based on SCr of 2.18 mg/dL (H)). Liver Function Tests: Recent Labs  Lab 03/18/19 0847 03/19/19 0253 03/20/19 0406 03/21/19 0303  AST 32 24 31 32  ALT _0 ALKPHOS 96 76 78 73  BILITOT 1.0 0.8 0.7 0.7  PROT 8.3* 7.0 7.3 7.0  ALBUMIN 4.2 3.4* 3.6 3.3*   No results for input(s): LIPASE, AMYLASE in the last 168 hours. No results for input(s): AMMONIA in the last 168 hours. Coagulation Profile: Recent Labs  Lab 03/16/19 1125  INR 1.1   Cardiac Enzymes: No results for input(s): CKTOTAL, CKMB, CKMBINDEX, TROPONINI in the last 168 hours. BNP (last 3 results) No results for input(s): PROBNP in the last 8760 hours. HbA1C: Recent Labs    03/19/19 0253 03/20/19 0406  HGBA1C 6.8* 6.7*   CBG: Recent Labs  Lab 03/20/19 1349 03/20/19 1525 03/20/19 2056 03/21/19 0940 03/21/19 1159  GLUCAP 274* 241* 262* 236* 158*   Lipid Profile: Recent Labs    03/20/19 0406  CHOL 143  HDL 48  LDLCALC 86  TRIG 43  CHOLHDL 3.0   Thyroid Function Tests: Recent Labs    03/21/19 0303  TSH 5.048*   Anemia Panel: Recent Labs    03/20/19 0406 03/21/19 0303  FERRITIN 2,172* 2,284*   Urine analysis:    Component Value Date/Time   COLORURINE YELLOW 03/18/2019 1337   APPEARANCEUR CLEAR 03/18/2019 1337   LABSPEC 1.009 03/18/2019 1337   PHURINE 5.0 03/18/2019 1337   GLUCOSEU NEGATIVE 03/18/2019 1337   HGBUR NEGATIVE 03/18/2019 1337   BILIRUBINUR NEGATIVE 03/18/2019 1337   KETONESUR NEGATIVE 03/18/2019 1337   PROTEINUR 100 (A) 03/18/2019 1337   NITRITE NEGATIVE 03/18/2019 1337   LEUKOCYTESUR NEGATIVE 03/18/2019 1337   Sepsis Labs:  _1 (procalcitonin:4,lacticidven:4)  ) Recent Results (from the past 240 hour(s))  Novel Coronavirus, NAA ([facility])     Status: None   Collection Time: 03/17/19 12:00 AM   Specimen: Oropharyngeal(OP) collection in vial transport medium   OROPHARYNGEA  TESTING  Result Value Ref Range Status   SARS-CoV-2, NAA Not Detected Not Detected Final    Comment: This nucleic acid amplification test was  developed and its performance characteristics determined by [employer]. Nucleic acid amplification tests include PCR and TMA. This test has not been FDA cleared or approved. This test has been authorized by FDA under an Emergency Use Authorization (EUA). This test is only authorized for the duration of time the declaration that circumstances exist justifying the authorization of the emergency use of in vitro diagnostic tests for detection of SARS-CoV-2 virus and/or diagnosis of COVID-19 infection under section 564(b)(1) of the Act, 21 U.S.C. 762UQJ-3(H) (1), unless the authorization is terminated or revoked sooner. When diagnostic testing is negative, the possibility of a false negative result should be considered in the context of a patient's recent exposures and the presence of clinical signs and symptoms consistent with COVID-19. An individual without symptoms of COVID-19 and who is not shedding SARS-CoV-2 virus would  expect to have a negative (not detected) result in this assay.   SARS Coronavirus 2 [facility] order, Performed in [facility] hospital lab) Nasopharyngeal Nasopharyngeal Swab     Status: Abnormal   Collection Time: 03/18/19  9:39 AM   Specimen: Nasopharyngeal Swab  Result Value Ref Range Status   SARS Coronavirus 2 POSITIVE (A) NEGATIVE Final    Comment: RESULT CALLED TO, READ BACK BY AND VERIFIED WITH: [place] FAWZE,PA [id number] @ 6389 BY J SCOTTON (NOTE) If result is NEGATIVE SARS-CoV-2 target nucleic acids are NOT DETECTED. The SARS-CoV-2 RNA is generally  detectable in upper and lower  respiratory specimens during the acute phase of infection. The lowest  concentration of SARS-CoV-2 viral copies this assay can detect is 250  copies / mL. A negative result does not preclude SARS-CoV-2 infection  and should not be used as the sole basis for treatment or other  patient management decisions.  A negative result may occur with  improper specimen collection / handling, submission of specimen other  than nasopharyngeal swab, presence of viral mutation(s) within the  areas targeted by this assay, and inadequate number of viral copies  (<250 copies / mL). A negative result must be combined with clinical  observations, patient history, and epidemiological information. If result is POSITIVE SARS-CoV-2 target nucleic acids are DETECTE D. The SARS-CoV-2 RNA is generally detectable in upper and lower  respiratory specimens during the acute phase of infection.  Positive  results are indicative of active infection with SARS-CoV-2.  Clinical  correlation with patient history and other diagnostic information is  necessary to determine patient infection status.  Positive results do  not rule out bacterial infection or co-infection with other viruses. If result is PRESUMPTIVE POSTIVE SARS-CoV-2 nucleic acids MAY BE PRESENT.   A presumptive positive result was obtained on the submitted specimen  and confirmed on repeat testing.  While 2019 novel coronavirus  (SARS-CoV-2) nucleic acids may be present in the submitted sample  additional confirmatory testing may be necessary for epidemiological  and / or clinical management purposes  to differentiate between  SARS-CoV-2 and other Sarbecovirus currently known to infect humans.  If clinically indicated additional testing with an alternate test  methodology (LAB745 3) is advised. The SARS-CoV-2 RNA is generally  detectable in upper and lower respiratory specimens during the acute  phase of infection. The  expected result is Negative. Fact Sheet for Patients:  StrictlyIdeas.no Fact Sheet for Healthcare Providers: BankingDealers.co.za This test is not yet approved or cleared by the [place] FDA and has been authorized for detection and/or diagnosis of SARS-CoV-2 by FDA under an Emergency Use Authorization (EUA).  This EUA will remain  in effect (meaning this test can be used) for the duration of the COVID-19 declaration under Section 564(b)(1) of the Act, 21 U.S.C. section 360bbb-3(b)(1), unless the authorization is terminated or revoked sooner. Performed at [facility], [place] [address] [place], [place] [address]   Blood Culture (routine x 2)     Status: None (Preliminary result)   Collection Time: 03/18/19  1:03 PM   Specimen: BLOOD  Result Value Ref Range Status   Specimen Description   Final    BLOOD LEFT ARM Performed at [place] [address], [place], [place] [address]    Special Requests   Final    BOTTLES DRAWN AEROBIC AND ANAEROBIC Blood Culture adequate volume Performed at [place] [address], [place], [place] [address]    Culture   Final    NO GROWTH 3 DAYS Performed at [facility], [place] [address], [place], [place] [address]    Report Status PENDING  Incomplete  Urine culture     Status: None   Collection Time: 03/18/19  1:37 PM   Specimen: Urine, Random  Result Value Ref Range Status   Specimen Description   Final    URINE, RANDOM Performed at [facility] [address], [place], [place] [address]    Special Requests   Final    NONE Performed at [facility], [place] [address] [place], [place] [address]    Culture   Final    Multiple bacterial morphotypes present, none predominant. Suggest appropriate recollection if clinically indicated.   Report Status 03/19/2019 FINAL  Final          Radiology Studies: No results found.      Scheduled Meds: . sodium chloride   Intravenous Once  . amiodarone  300 mg Oral Daily  . amLODipine  10 mg Oral Daily  . atorvastatin  60 mg Oral Daily  . cloNIDine  0.1 mg Oral BID  . dexamethasone (DECADRON) injection  6 mg Intravenous Q24H  . docusate sodium  100 mg Oral BID  . furosemide  40 mg Intravenous Daily  . heparin  5,000 Units Subcutaneous Q8H  . hydrALAZINE  100 mg Oral TID  . insulin aspart  0-20 Units Subcutaneous TID WC  . insulin aspart  0-5 Units Subcutaneous QHS  . insulin glargine  8 Units Subcutaneous Q24H  . Ipratropium-Albuterol  1 puff Inhalation Q6H  . latanoprost  1 drop Both Eyes QHS  . [START ON 03/22/2019] levothyroxine  62.5 mcg Oral Q0600  . sildenafil  20 mg Oral TID  . vitamin C  500 mg Oral Daily  . zinc sulfate  220 mg Oral Daily   Continuous Infusions: . remdesivir 100 mg in NS 250 mL 100 mg (03/20/19 2235)     LOS: 3 days   The patient is critically ill with multiple organ systems failure and requires high complexity decision making for assessment and support, frequent evaluation and titration of therapies, application of advanced monitoring technologies and extensive interpretation of multiple databases. Critical Care Time devoted to patient care services described in this note  Time spent: 40 minutes     Dashanti Burr, Geraldo Docker, MD [facility] Pager [phone]  If 7PM-7AM, please contact night-coverage [email or web address] Password [email or web address] 03/21/2019, 4:50 PM

## 2019-03-22 LAB — CBC WITH DIFFERENTIAL/PLATELET
Abs Immature Granulocytes: 0.1 10*3/uL — ABNORMAL HIGH (ref 0.00–0.07)
Basophils Absolute: 0 10*3/uL (ref 0.0–0.1)
Basophils Relative: 0 %
Eosinophils Absolute: 0 10*3/uL (ref 0.0–0.5)
Eosinophils Relative: 0 %
HCT: 24.2 % — ABNORMAL LOW (ref 36.0–46.0)
Hemoglobin: 8.2 g/dL — ABNORMAL LOW (ref 12.0–15.0)
Immature Granulocytes: 2 %
Lymphocytes Relative: 9 %
Lymphs Abs: 0.5 10*3/uL — ABNORMAL LOW (ref 0.7–4.0)
MCH: 30.5 pg (ref 26.0–34.0)
MCHC: 33.9 g/dL (ref 30.0–36.0)
MCV: 90 fL (ref 80.0–100.0)
Monocytes Absolute: 0.6 10*3/uL (ref 0.1–1.0)
Monocytes Relative: 12 %
Neutro Abs: 3.9 10*3/uL (ref 1.7–7.7)
Neutrophils Relative %: 77 %
Platelets: 144 10*3/uL — ABNORMAL LOW (ref 150–400)
RBC: 2.69 MIL/uL — ABNORMAL LOW (ref 3.87–5.11)
RDW: 17.7 % — ABNORMAL HIGH (ref 11.5–15.5)
WBC: 5.1 10*3/uL (ref 4.0–10.5)
nRBC: 0.4 % — ABNORMAL HIGH (ref 0.0–0.2)

## 2019-03-22 LAB — COMPREHENSIVE METABOLIC PANEL WITH GFR
ALT: 25 U/L (ref 0–44)
AST: 38 U/L (ref 15–41)
Albumin: 3.3 g/dL — ABNORMAL LOW (ref 3.5–5.0)
Alkaline Phosphatase: 76 U/L (ref 38–126)
Anion gap: 13 (ref 5–15)
BUN: 90 mg/dL — ABNORMAL HIGH (ref 8–23)
CO2: 20 mmol/L — ABNORMAL LOW (ref 22–32)
Calcium: 9.2 mg/dL (ref 8.9–10.3)
Chloride: 100 mmol/L (ref 98–111)
Creatinine, Ser: 2.63 mg/dL — ABNORMAL HIGH (ref 0.44–1.00)
GFR calc Af Amer: 21 mL/min — ABNORMAL LOW
GFR calc non Af Amer: 18 mL/min — ABNORMAL LOW
Glucose, Bld: 369 mg/dL — ABNORMAL HIGH (ref 70–99)
Potassium: 5.2 mmol/L — ABNORMAL HIGH (ref 3.5–5.1)
Sodium: 133 mmol/L — ABNORMAL LOW (ref 135–145)
Total Bilirubin: 0.4 mg/dL (ref 0.3–1.2)
Total Protein: 6.8 g/dL (ref 6.5–8.1)

## 2019-03-22 LAB — D-DIMER, QUANTITATIVE: D-Dimer, Quant: 0.57 ug/mL-FEU — ABNORMAL HIGH (ref 0.00–0.50)

## 2019-03-22 LAB — GLUCOSE, CAPILLARY
Glucose-Capillary: 316 mg/dL — ABNORMAL HIGH (ref 70–99)
Glucose-Capillary: 226 mg/dL — ABNORMAL HIGH (ref 70–99)
Glucose-Capillary: 242 mg/dL — ABNORMAL HIGH (ref 70–99)
Glucose-Capillary: 396 mg/dL — ABNORMAL HIGH (ref 70–99)

## 2019-03-22 LAB — PHOSPHORUS: Phosphorus: 3.8 mg/dL (ref 2.5–4.6)

## 2019-03-22 LAB — MAGNESIUM: Magnesium: 1.7 mg/dL (ref 1.7–2.4)

## 2019-03-22 LAB — C-REACTIVE PROTEIN: CRP: 3 mg/dL — ABNORMAL HIGH (ref <1.0)

## 2019-03-22 LAB — FERRITIN: Ferritin: 2522 ng/mL — ABNORMAL HIGH (ref 11–307)

## 2019-03-22 MED ORDER — LEVOTHYROXINE SODIUM 125 MCG PO TABS
62.5000 ug | ORAL_TABLET | Freq: Every day | ORAL | Status: DC
  Administered 2019-03-22 – 2019-03-23 (×2): 62.5 ug via ORAL
  Filled 2019-03-22 (×3): qty 0.5

## 2019-03-22 MED ORDER — SODIUM ZIRCONIUM CYCLOSILICATE 10 G PO PACK
10.0000 g | PACK | Freq: Every day | ORAL | Status: DC
  Administered 2019-03-22 – 2019-03-23 (×2): 10 g via ORAL
  Filled 2019-03-22 (×3): qty 1

## 2019-03-22 MED ORDER — LEVOTHYROXINE SODIUM 125 MCG PO TABS
62.5000 ug | ORAL_TABLET | Freq: Every day | ORAL | Status: DC
  Filled 2019-03-22 (×2): qty 0.5

## 2019-03-22 NOTE — Progress Notes (Signed)
Patient on the phone with spouse [name] when nurse entered the room.  Spoke with patient and spouse on speaker phone.  Answered all questions and concerns.  Patient and spouse are extremely happy the patient will be going home tomorrow and express gratitude for the staff.

## 2019-03-22 NOTE — Progress Notes (Signed)
PROGRESS NOTE  Patricia Mata  KCL:[id number] DOB: 04/29/1956 DOA: 03/18/2019 PCP: [name], FNP  Brief Narrative: Lynora Dymond is a 63 y.o. female with a history of T2DM, stage IV CKD, colon CA in remission s/p resection and chemotherapy, severe aortic stenosis, chronic HFpEF, PAF, hypothyroidism, and transfusion-dependent anemia of chronic disease and myelodysplastic syndrome. She presented 9/3 from home, where she was helping her husband recover from covid-19 after his discharge from this hospital, with multiple symptoms including fever, chills, myalgias, cough, and headache. On presentation she was hypoxic with progressive worsening requiring 10L HFNC with LUL opacity on CXR, positive SARS-CoV-2 PCR, and elevated inflammatory markers. Steroids and remdesivir started, and the patient admitted to [facility].   Assessment & Plan: Active Problems:   Anemia in chronic kidney disease   MDS (myelodysplastic syndrome) (HCC)   Essential hypertension   Hyperlipidemia   Severe aortic stenosis   Carotid artery disease (HCC)   Atrial fibrillation with rapid ventricular response (HCC)   Acute hypoxemic respiratory failure (HCC)   Gout   Type 2 diabetes mellitus without complication, without long-term current use of insulin (HCC)   Hypothyroidism   Pneumonia due to COVID-19 virus   Bradycardia  Acute hypoxic respiratory failure due to covid-19 pneumonia:  - Complete 5 days of remdesivir this evening.  - Continue steroids x 10 days - Continue airborne, contact precautions while admitted, will need 14 days isolation following discharge.  - Check daily labs: CBC w/diff, CMP, CRP. D-dimer within age-adjusted normal limit - Maintain euvolemia/net negative.  - Avoid NSAIDs - Has been weaned from supplemental oxygen.  Paroxysmal AFib: Currently in sinus bradycardia.  - Continue amiodarone, holding beta blocker due to bradycardia.  - Not on anticoagulation despite elevated CHA2DS2-VASc score (4) presumably  due to transfusion-dependent MDS.   Chronic HFpEF, HTN, pulmonary HTN, severe aortic stenosis: No significant CAD on [facility] Nov 2019.  - Euvolemic, continue lasix, revatio, hydralazine, norvasc. Started clonidine in place of metoprolol.  - Needs continued cardiology follow up (both HF clinic, Dr. Haroldine Laws and Dr. Gwenlyn Found). - ?Candidate for TAVR.   Myelodysplastic syndrome, anemia of chronic disease: Dx 2017.  - Continue intermittent transfusions, received 1u PRBCs here 9/4.   Colon CA: s/p hemicolectomy and chemotherapy 2008: Port was removed 03/16/2019. - Follow up per routine w/Dr. Alen Blew.   Hypothyroidism: TSH 5.048. Due to concomitant sinus bradycardia, synthroid dosing was increased modestly.  - Will not make durable change to regimen given risk of AFib with RVR and previous need for ICU in that setting. Follow up with PCP for repeat labs in ~6 weeks.   Hyperkalemia:  - Lokelma today, lasix continues, on telemetry. Recheck in AM  T2DM: HbA1c 5.8%. - Lantus + SSI while admitted due to steroid-induced hyperglycemia - Continue statin  Stage IV CKD: Baseline 2.2 - 2.6.  - Avoid nephrotoxins, hypotension. Remains at baseline.  Thrombocytopenia: Stable.  - Monitor intermittently.  Obesity: BMI 31.  - Noted  DVT prophylaxis: Heparin 5,000units q8h Code Status: Listed as full code for this admission, formerly DNR Family Communication: None at bedside, did not request call Disposition Plan: Home 9/8 pending clinical stability after completing remdesivir.  Consultants:   None  Procedures:   None  Antimicrobials:  Remdesivir   Subjective: Feels well. No breathing problems or any other complaints. Ready to go home tomorrow. No bleeding.  Objective: Vitals:   03/22/19 0355 03/22/19 0400 03/22/19 0618 03/22/19 0800  BP:  (!) 146/53  (!) 179/48  Pulse: (!) 54 Marland Kitchen)  55  (!) 55  Resp: 12 16  14   Temp: 98.7 F (37.1 C)   98.7 F (37.1 C)  TempSrc: Oral   Oral  SpO2: 100%  99%  100%  Weight:   82.4 kg   Height:        Intake/Output Summary (Last 24 hours) at 03/22/2019 1036 Last data filed at 03/22/2019 0910 Gross per 24 hour  Intake 480 ml  Output 2200 ml  Net -1720 ml   Filed Weights   03/19/19 1114 03/21/19 0900 03/22/19 0618  Weight: 83.8 kg 81.8 kg 82.4 kg    Gen: 63 y.o. female in no distress  Pulm: Non-labored breathing room air. Clear to auscultation bilaterally.  CV: Regular rate and rhythm. IV/VI holosystolic murmur at base radiating to carotids, no rub, or gallop. No JVD, no pedal edema. GI: Abdomen soft, non-tender, non-distended, with normoactive bowel sounds. No organomegaly or masses felt. Ext: Warm, no deformities Skin: No rashes, lesions or ulcers Neuro: Alert and oriented. No focal neurological deficits. Psych: Judgement and insight appear normal. Mood & affect appropriate.   Data Reviewed: I have personally reviewed following labs and imaging studies  CBC: Recent Labs  Lab 03/18/19 0847 03/19/19 0253 03/20/19 0406 03/21/19 0303 03/22/19 0220  WBC 8.9 5.4 8.4 7.2 5.1  NEUTROABS 7.6 4.3 7.2 5.6 3.9  HGB 7.7* 6.4* 7.8* 8.2* 8.2*  HCT 23.7* 19.4* 23.0* 23.9* 24.2*  MCV 92.2 90.7 89.5 89.5 90.0  PLT 146* 122* 132* 145* 716*   Basic Metabolic Panel: Recent Labs  Lab 03/18/19 0847 03/19/19 0253 03/20/19 0406 03/21/19 0303 03/22/19 0220  NA 137 135 134* 133* 133*  K 4.0 4.0 4.6 4.3 5.2*  CL 104 104 103 101 100  CO2 20* 20* 19* 20* 20*  GLUCOSE 172* 324* 316* 279* 369*  BUN 57* 72* 79* 77* 90*  CREATININE 2.16* 2.38* 2.22* 2.18* 2.63*  CALCIUM 9.5 8.7* 9.4 9.4 9.2  MG  --   --  1.9 1.8 1.7  PHOS  --   --  3.5 3.3 3.8   GFR: Estimated Creatinine Clearance: 22.4 mL/min (A) (by C-G formula based on SCr of 2.63 mg/dL (H)). Liver Function Tests: Recent Labs  Lab 03/18/19 0847 03/19/19 0253 03/20/19 0406 03/21/19 0303 03/22/19 0220  AST 32 24 31 32 38  ALT 18 13 19 22 25   ALKPHOS 96 76 78 73 76  BILITOT 1.0 0.8  0.7 0.7 0.4  PROT 8.3* 7.0 7.3 7.0 6.8  ALBUMIN 4.2 3.4* 3.6 3.3* 3.3*   No results for input(s): LIPASE, AMYLASE in the last 168 hours. No results for input(s): AMMONIA in the last 168 hours. Coagulation Profile: Recent Labs  Lab 03/16/19 1125  INR 1.1   Cardiac Enzymes: No results for input(s): CKTOTAL, CKMB, CKMBINDEX, TROPONINI in the last 168 hours. BNP (last 3 results) No results for input(s): PROBNP in the last 8760 hours. HbA1C: Recent Labs    03/20/19 0406  HGBA1C 6.7*   CBG: Recent Labs  Lab 03/21/19 0940 03/21/19 1159 03/21/19 1650 03/21/19 2003 03/22/19 0731  GLUCAP 236* 158* 235* 321* 316*   Lipid Profile: Recent Labs    03/20/19 0406  CHOL 143  HDL 48  LDLCALC 86  TRIG 43  CHOLHDL 3.0   Thyroid Function Tests: Recent Labs    03/21/19 0303  TSH 5.048*   Anemia Panel: Recent Labs    03/21/19 0303 03/22/19 0220  FERRITIN 2,284* 2,522*   Urine analysis:    Component Value  Date/Time   COLORURINE YELLOW 03/18/2019 1337   APPEARANCEUR CLEAR 03/18/2019 1337   LABSPEC 1.009 03/18/2019 1337   PHURINE 5.0 03/18/2019 1337   GLUCOSEU NEGATIVE 03/18/2019 1337   HGBUR NEGATIVE 03/18/2019 1337   BILIRUBINUR NEGATIVE 03/18/2019 1337   [place] 03/18/2019 1337   PROTEINUR 100 (A) 03/18/2019 1337   NITRITE NEGATIVE 03/18/2019 1337   LEUKOCYTESUR NEGATIVE 03/18/2019 1337   Recent Results (from the past 240 hour(s))  Novel Coronavirus, NAA ([facility])     Status: None   Collection Time: 03/17/19 12:00 AM   Specimen: Oropharyngeal(OP) collection in vial transport medium   OROPHARYNGEA  TESTING  Result Value Ref Range Status   SARS-CoV-2, NAA Not Detected Not Detected Final    Comment: This nucleic acid amplification test was developed and its performance characteristics determined by [employer]. Nucleic acid amplification tests include PCR and TMA. This test has not been FDA cleared or approved. This test has been authorized by  FDA under an Emergency Use Authorization (EUA). This test is only authorized for the duration of time the declaration that circumstances exist justifying the authorization of the emergency use of in vitro diagnostic tests for detection of SARS-CoV-2 virus and/or diagnosis of COVID-19 infection under section 564(b)(1) of the Act, 21 U.S.C. 045WUJ-8(J) (1), unless the authorization is terminated or revoked sooner. When diagnostic testing is negative, the possibility of a false negative result should be considered in the context of a patient's recent exposures and the presence of clinical signs and symptoms consistent with COVID-19. An individual without symptoms of COVID-19 and who is not shedding SARS-CoV-2 virus would  expect to have a negative (not detected) result in this assay.   Blood Culture (routine x 2)     Status: None (Preliminary result)   Collection Time: 03/18/19  9:28 AM   Specimen: BLOOD  Result Value Ref Range Status   Specimen Description   Final    BLOOD LEFT ANTECUBITAL Performed at [facility], [place] [address], [place], [place] [address]    Special Requests   Final    BOTTLES DRAWN AEROBIC AND ANAEROBIC Blood Culture adequate volume Performed at [facility] [address], [place], [place] [address]    Culture PENDING  Incomplete   Report Status PENDING  Incomplete  SARS Coronavirus 2 [facility] order, Performed in [facility] hospital lab) Nasopharyngeal Nasopharyngeal Swab     Status: Abnormal   Collection Time: 03/18/19  9:39 AM   Specimen: Nasopharyngeal Swab  Result Value Ref Range Status   SARS Coronavirus 2 POSITIVE (A) NEGATIVE Final    Comment: RESULT CALLED TO, READ BACK BY AND VERIFIED WITH: [place] FAWZE,PA [id number] @ 5784 BY J SCOTTON (NOTE) If result is NEGATIVE SARS-CoV-2 target nucleic acids are NOT DETECTED. The SARS-CoV-2 RNA is generally detectable in upper and lower  respiratory specimens during the acute phase of  infection. The lowest  concentration of SARS-CoV-2 viral copies this assay can detect is 250  copies / mL. A negative result does not preclude SARS-CoV-2 infection  and should not be used as the sole basis for treatment or other  patient management decisions.  A negative result may occur with  improper specimen collection / handling, submission of specimen other  than nasopharyngeal swab, presence of viral mutation(s) within the  areas targeted by this assay, and inadequate number of viral copies  (<250 copies / mL). A negative result must be combined with clinical  observations, patient history, and epidemiological information.  If result is POSITIVE SARS-CoV-2 target nucleic acids are DETECTE D. The SARS-CoV-2 RNA is generally detectable in upper and lower  respiratory specimens during the acute phase of infection.  Positive  results are indicative of active infection with SARS-CoV-2.  Clinical  correlation with patient history and other diagnostic information is  necessary to determine patient infection status.  Positive results do  not rule out bacterial infection or co-infection with other viruses. If result is PRESUMPTIVE POSTIVE SARS-CoV-2 nucleic acids MAY BE PRESENT.   A presumptive positive result was obtained on the submitted specimen  and confirmed on repeat testing.  While 2019 novel coronavirus  (SARS-CoV-2) nucleic acids may be present in the submitted sample  additional confirmatory testing may be necessary for epidemiological  and / or clinical management purposes  to differentiate between  SARS-CoV-2 and other Sarbecovirus currently known to infect humans.  If clinically indicated additional testing with an alternate test  methodology (LAB745 3) is advised. The SARS-CoV-2 RNA is generally  detectable in upper and lower respiratory specimens during the acute  phase of infection. The expected result is Negative. Fact Sheet for Patients:   StrictlyIdeas.no Fact Sheet for Healthcare Providers: BankingDealers.co.za This test is not yet approved or cleared by the [place] FDA and has been authorized for detection and/or diagnosis of SARS-CoV-2 by FDA under an Emergency Use Authorization (EUA).  This EUA will remain in effect (meaning this test can be used) for the duration of the COVID-19 declaration under Section 564(b)(1) of the Act, 21 U.S.C. section 360bbb-3(b)(1), unless the authorization is terminated or revoked sooner. Performed at [facility], [place] [address] [place], [place] [address]   Blood Culture (routine x 2)     Status: None (Preliminary result)   Collection Time: 03/18/19  1:03 PM   Specimen: BLOOD  Result Value Ref Range Status   Specimen Description   Final    BLOOD LEFT ARM Performed at [place] [address], [place], [place] [address]    Special Requests   Final    BOTTLES DRAWN AEROBIC AND ANAEROBIC Blood Culture adequate volume Performed at [facility] [address], [place], [place] [address]    Culture   Final    NO GROWTH 4 DAYS Performed at [facility], [place] [address] [place], [place] [address]    Report Status PENDING  Incomplete  Urine culture     Status: None   Collection Time: 03/18/19  1:37 PM   Specimen: Urine, Random  Result Value Ref Range Status   Specimen Description   Final    URINE, RANDOM Performed at [place] [address] [place], [place] [address]    Special Requests   Final    NONE Performed at [facility], [place] [address], [place], [place] [address]    Culture   Final    Multiple bacterial morphotypes present, none predominant. Suggest appropriate recollection if clinically indicated.   Report Status 03/19/2019 FINAL  Final      Radiology Studies: No results found.  Scheduled Meds: . sodium  chloride   Intravenous Once  . amiodarone  300 mg Oral Daily  . amLODipine  10 mg Oral Daily  . atorvastatin  60 mg Oral Daily  . cloNIDine  0.1 mg Oral BID  . dexamethasone (DECADRON) injection  6 mg Intravenous Q24H  . docusate sodium  100 mg Oral BID  . furosemide  40 mg Intravenous  Daily  . heparin  5,000 Units Subcutaneous Q8H  . hydrALAZINE  100 mg Oral TID  . insulin aspart  0-20 Units Subcutaneous TID WC  . insulin aspart  0-5 Units Subcutaneous QHS  . insulin glargine  8 Units Subcutaneous Q24H  . Ipratropium-Albuterol  1 puff Inhalation Q6H  . latanoprost  1 drop Both Eyes QHS  . levothyroxine  62.5 mcg Oral Q0600  . sildenafil  20 mg Oral TID  . sodium zirconium cyclosilicate  10 g Oral Daily  . vitamin C  500 mg Oral Daily  . zinc sulfate  220 mg Oral Daily   Continuous Infusions: . remdesivir 100 mg in NS 250 mL Stopped (03/21/19 2230)     LOS: 4 days   Time spent: 25 minutes.  Patrecia Pour, MD [facility] [email or web address] Password [facility] 03/22/2019, 10:36 AM

## 2019-03-23 ENCOUNTER — Inpatient Hospital Stay: Payer: BC Managed Care – PPO

## 2019-03-23 DIAGNOSIS — I6523 Occlusion and stenosis of bilateral carotid arteries: Secondary | ICD-10-CM

## 2019-03-23 LAB — CBC WITH DIFFERENTIAL/PLATELET
Abs Immature Granulocytes: 0.2 10*3/uL — ABNORMAL HIGH (ref 0.00–0.07)
Basophils Absolute: 0 10*3/uL (ref 0.0–0.1)
Basophils Relative: 0 %
Eosinophils Absolute: 0 10*3/uL (ref 0.0–0.5)
Eosinophils Relative: 0 %
HCT: 23.7 % — ABNORMAL LOW (ref 36.0–46.0)
Hemoglobin: 7.9 g/dL — ABNORMAL LOW (ref 12.0–15.0)
Immature Granulocytes: 3 %
Lymphocytes Relative: 11 %
Lymphs Abs: 0.8 10*3/uL (ref 0.7–4.0)
MCH: 29.6 pg (ref 26.0–34.0)
MCHC: 33.3 g/dL (ref 30.0–36.0)
MCV: 88.8 fL (ref 80.0–100.0)
Monocytes Absolute: 0.9 10*3/uL (ref 0.1–1.0)
Monocytes Relative: 12 %
Neutro Abs: 5.5 10*3/uL (ref 1.7–7.7)
Neutrophils Relative %: 74 %
Platelets: 151 10*3/uL (ref 150–400)
RBC: 2.67 MIL/uL — ABNORMAL LOW (ref 3.87–5.11)
RDW: 17.9 % — ABNORMAL HIGH (ref 11.5–15.5)
WBC: 7.3 10*3/uL (ref 4.0–10.5)
nRBC: 0.3 % — ABNORMAL HIGH (ref 0.0–0.2)

## 2019-03-23 LAB — COMPREHENSIVE METABOLIC PANEL
ALT: 23 U/L (ref 0–44)
AST: 26 U/L (ref 15–41)
Albumin: 3.3 g/dL — ABNORMAL LOW (ref 3.5–5.0)
Alkaline Phosphatase: 73 U/L (ref 38–126)
Anion gap: 9 (ref 5–15)
BUN: 101 mg/dL — ABNORMAL HIGH (ref 8–23)
CO2: 20 mmol/L — ABNORMAL LOW (ref 22–32)
Calcium: 8.8 mg/dL — ABNORMAL LOW (ref 8.9–10.3)
Chloride: 99 mmol/L (ref 98–111)
Creatinine, Ser: 2.77 mg/dL — ABNORMAL HIGH (ref 0.44–1.00)
GFR calc Af Amer: 20 mL/min — ABNORMAL LOW (ref >60)
GFR calc non Af Amer: 17 mL/min — ABNORMAL LOW (ref >60)
Glucose, Bld: 430 mg/dL — ABNORMAL HIGH (ref 70–99)
Potassium: 4.5 mmol/L (ref 3.5–5.1)
Sodium: 128 mmol/L — ABNORMAL LOW (ref 135–145)
Total Bilirubin: 0.6 mg/dL (ref 0.3–1.2)
Total Protein: 6.8 g/dL (ref 6.5–8.1)

## 2019-03-23 LAB — GLUCOSE, CAPILLARY
Glucose-Capillary: 186 mg/dL — ABNORMAL HIGH (ref 70–99)
Glucose-Capillary: 365 mg/dL — ABNORMAL HIGH (ref 70–99)
Glucose-Capillary: 218 mg/dL — ABNORMAL HIGH (ref 70–99)

## 2019-03-23 LAB — C-REACTIVE PROTEIN: CRP: 2 mg/dL — ABNORMAL HIGH (ref <1.0)

## 2019-03-23 LAB — CULTURE, BLOOD (ROUTINE X 2)
Culture: NO GROWTH
Culture: NO GROWTH
Special Requests: ADEQUATE
Special Requests: ADEQUATE

## 2019-03-23 MED ORDER — SITAGLIPTIN PHOSPHATE 50 MG PO TABS: 50.0000 mg | ORAL_TABLET | Freq: Every day | ORAL | 0 refills | Status: DC

## 2019-03-23 MED ORDER — INSULIN GLARGINE 100 UNITS/ML SOLOSTAR PEN: 15.0000 [IU] | PEN_INJECTOR | Freq: Every day | SUBCUTANEOUS | Status: DC

## 2019-03-23 MED ORDER — METOPROLOL SUCCINATE ER 25 MG PO TB24: 25.0000 mg | ORAL_TABLET | Freq: Every day | ORAL | 5 refills | Status: DC

## 2019-03-23 MED ORDER — INSULIN GLARGINE 100 UNIT/ML ~~LOC~~ SOLN
10.0000 [IU] | SUBCUTANEOUS | Status: DC
  Administered 2019-03-23: 10 [IU] via SUBCUTANEOUS
  Filled 2019-03-23: qty 0.1

## 2019-03-23 NOTE — Discharge Instructions (Signed)

## 2019-03-23 NOTE — Discharge Summary (Signed)
Physician Discharge Summary  Patricia Mata EHM:[id number] DOB: 30-May-1956 DOA: 03/18/2019  PCP: [name], FNP  Admit date: 03/18/2019 Discharge date: 03/23/2019  Admitted From: Home Disposition: Home   Recommendations for Outpatient Follow-up:  1. Follow up with PCP within the next week. 2. Monitor HR. Due to sinus bradycardia, metoprolol was stopped during and following admission. 3. Please obtain CMP, CBC at follow up.  4. Recheck TSH in 4-6 weeks and adjust synthroid dose as indicated (TSH slightly elevated during admission, no changes made in setting of acute illness).  5. Monitor blood glucose. Suspect she will continue to require insulin after discharge. Discharged on 15u lantus daily with directions for titration per PCP follow up.  6. Consider nephrology referral if not already done. CrCl <56m/min so switched janumet to jTongaalone.  Home Health: None Equipment/Devices: None Discharge Condition: Stable CODE STATUS: Full Diet recommendation: Carb-modified, heart healthy  Brief/Interim Summary: Patricia Denmanis a 63y.o. female with a history of T2DM, stage IV CKD, colon CA in remission s/p resection and chemotherapy, severe aortic stenosis, chronic HFpEF, PAF, hypothyroidism, and transfusion-dependent anemia of chronic disease and myelodysplastic syndrome. She presented 9/3 from home, where she was helping her husband recover from covid-19 after his discharge from this hospital, with multiple symptoms including fever, chills, myalgias, cough, and headache. On presentation she was hypoxic with progressive worsening requiring 10L HFNC with LUL opacity on CXR, positive SARS-CoV-2 PCR, and elevated inflammatory markers. Steroids and remdesivir started, and the patient admitted to [facility] The patient showed rapid improvement in symptoms, resolution of hypoxia, and sustained diminution of inflammatory markers. See below for further details.  Discharge Diagnoses:  Active Problems:   Anemia in  chronic kidney disease   MDS (myelodysplastic syndrome) (HCC)   Essential hypertension   Hyperlipidemia   Severe aortic stenosis   Carotid artery disease (HCC)   Atrial fibrillation with rapid ventricular response (HCC)   Acute hypoxemic respiratory failure (HCC)   Gout   Type 2 diabetes mellitus without complication, without long-term current use of insulin (HCC)   Hypothyroidism   Pneumonia due to COVID-19 virus   Bradycardia  Acute hypoxic respiratory failure due to covid-19 pneumonia:  - Completed 5 days of remdesivir   - Completed 6 days of steroids. Given magnitude of steroid-induced hyperglycemia and degree of improvement in covid-related symptoms and inflammatory markers, steroids will not be continued after discharge. - Continue airborne, contact precautions while admitted, will need 14 days isolation following discharge.  - Has been weaned from supplemental oxygen.  Paroxysmal AFib: Currently in sinus bradycardia.  - Continue amiodarone, holding beta blocker due to bradycardia. Directed to restart this if HR goes above 100bpm. Has not tolerated AFib w/RVR well in the past. - Not on anticoagulation despite elevated CHA2DS2-VASc score(4) presumably due to transfusion-dependent MDS.   Chronic HFpEF, HTN, pulmonary HTN, severe aortic stenosis: [facility]  - Euvolemic, continue all home medications. - Needs continued cardiology follow up (both HF clinic, Dr. BHaroldine Lawsand Dr. BGwenlyn Mata. - ?Candidate for TAVR.   Myelodysplastic syndrome, anemia of chronic disease: Dx 2017.  - Continue intermittent transfusions, received 1u PRBCs here 9/4.   Colon CA: s/p hemicolectomy and chemotherapy 2008: Port was removed 03/16/2019. - Follow up per routine w/Dr. SAlen Mata   Hypothyroidism: TSH 5.048.  - Will not make durable change to regimen given risk of AFib with RVR and previous need for ICU in that setting. Follow up with PCP for repeat labs in ~6  weeks.    Hyperkalemia: Resolved.  - Continue lasix and recheck at follow up.  T2DM with steroid-induced hyperglycemia: OZH0Q 6.5% is certainly falsely low due to recurrent transfusions. Based on inpatient glucose control, I suspect this is poorly controlled since coming off insulin.  - Continue lantus at discharge. Patient states she has pens at home in addition to all necessary supplies and will administer 15 units daily and titrate based on multiple daily CBG checks. Close PCP follow up (I.e. within this week) is recommended to guide titration. - Due to CrCl <76m/min, janumet was discontinued and replaced with januvia 538mdaily alone. - Continue statin  Stage IV CKD: Baseline 2.2 - 2.6. Suspect uncontrolled hyperglycemia is causing worsening baseline.  - Avoid nephrotoxins, hypotension.  - Recheck at follow up.  Thrombocytopenia: Stable.  - Monitor intermittently.  Obesity: BMI 31.  - Noted  Discharge Instructions Discharge Instructions    Diet - low sodium heart healthy   Complete by: As directed    Diet Carb Modified   Complete by: As directed    Discharge instructions   Complete by: As directed    You are being discharged from the hospital after treatment for covid-19 infection. You are felt to be stable enough to no longer require inpatient monitoring, testing, and treatment, though you will need to follow the recommendations below: - You have completed a course of remdesivir and steroids and no longer require oxygen.  SEVERAL MEDICATION CHANGES HAVE BEEN MADE:  - HOLD METOPROLOL until you follow up with your doctor. Your heart rate is slow and would be made even slower with this medication. However, if your heart rate increases above 100 beats per minute, you will need to restart this medication. - HOLD janumet until you follow up with your doctor. Your decreased kidney function makes one of the medications in this pill dangerous to take, so the stop taking this and instead  RERainierhich has been sent to your pharmacy.  - RESTART LANTUS INSULIN. Take 15 units once daily. Check blood sugars at least twice per day and record these values. Discuss with your primary provider further management. If glucose values go below 150, decrease the dose of insulin to 10 units daily, but if they remain above 300, increase to 20 units daily. Either way, follow the advice of your medical team. - Remain in self-isolation for 14 days following discharge to reduce risk of transmission of the virus.  - Do not take NSAID medications (including, but not limited to, ibuprofen, advil, motrin, naproxen, aleve, goody's powder, etc.) - Follow up with your doctor in the next week via telehealth or seek medical attention right away if your symptoms get WORSE.  - Consider donating plasma after you have recovered (either 14 days after a negative test or 28 days after symptoms have completely resolved) because your antibodies to this virus may be helpful to give to others with life-threatening infections. Please go to the website [email or web address] if you would like to consider volunteering for plasma donation.    Directions for you at home:  Wear a facemask You should wear a facemask that covers your nose and mouth when you are in the same room with other people and when you visit a healthcare provider. People who live with or visit you should also wear a facemask while they are in the same room with you.  Separate yourself from other people in your home As much as possible, you should stay in a  different room from other people in your home. Also, you should use a separate bathroom, if available.  Avoid sharing household items You should not share dishes, drinking glasses, cups, eating utensils, towels, bedding, or other items with other people in your home. After using these items, you should wash them thoroughly with soap and water.  Cover your coughs and sneezes Cover your  mouth and nose with a tissue when you cough or sneeze, or you can cough or sneeze into your sleeve. Throw used tissues in a lined trash can, and immediately wash your hands with soap and water for at least 20 seconds or use an alcohol-based hand rub.  Wash your [facility] your hands often and thoroughly with soap and water for at least 20 seconds. You can use an alcohol-based hand sanitizer if soap and water are not available and if your hands are not visibly dirty. Avoid touching your eyes, nose, and mouth with unwashed hands.  Directions for those who live with, or provide care at home for you:  Limit the number of people who have contact with the patient If possible, have only one caregiver for the patient. Other household members should stay in another home or place of residence. If this is not possible, they should stay in another room, or be separated from the patient as much as possible. Use a separate bathroom, if available. Restrict visitors who do not have an essential need to be in the home.  Ensure good ventilation Make sure that shared spaces in the home have good air flow, such as from an air conditioner or an opened window, weather permitting.  Wash your hands often Wash your hands often and thoroughly with soap and water for at least 20 seconds. You can use an alcohol based hand sanitizer if soap and water are not available and if your hands are not visibly dirty. Avoid touching your eyes, nose, and mouth with unwashed hands. Use disposable paper towels to dry your hands. If not available, use dedicated cloth towels and replace them when they become wet.  Wear a facemask and gloves Wear a disposable facemask at all times in the room and gloves when you touch or have contact with the patient's blood, body fluids, and/or secretions or excretions, such as sweat, saliva, sputum, nasal mucus, vomit, urine, or feces.  Ensure the mask fits over your nose and mouth tightly, and do  not touch it during use. Throw out disposable facemasks and gloves after using them. Do not reuse. Wash your hands immediately after removing your facemask and gloves. If your personal clothing becomes contaminated, carefully remove clothing and launder. Wash your hands after handling contaminated clothing. Place all used disposable facemasks, gloves, and other waste in a lined container before disposing them with other household waste. Remove gloves and wash your hands immediately after handling these items.  Do not share dishes, glasses, or other household items with the patient Avoid sharing household items. You should not share dishes, drinking glasses, cups, eating utensils, towels, bedding, or other items with a patient who is confirmed to have, or being evaluated for, COVID-19 infection. After the person uses these items, you should wash them thoroughly with soap and water.  Wash laundry thoroughly Immediately remove and wash clothes or bedding that have blood, body fluids, and/or secretions or excretions, such as sweat, saliva, sputum, nasal mucus, vomit, urine, or feces, on them. Wear gloves when handling laundry from the patient. Read and follow directions on labels of laundry  or clothing items and detergent. In general, wash and dry with the warmest temperatures recommended on the label.  Clean all areas the individual has used often Clean all touchable surfaces, such as counters, tabletops, doorknobs, bathroom fixtures, toilets, phones, keyboards, tablets, and bedside tables, every day. Also, clean any surfaces that may have blood, body fluids, and/or secretions or excretions on them. Wear gloves when cleaning surfaces the patient has come in contact with. Use a diluted bleach solution (e.g., dilute bleach with 1 part bleach and 10 parts water) or a household disinfectant with a label that says EPA-registered for coronaviruses. To make a bleach solution at home, add 1 tablespoon of  bleach to 1 quart (4 cups) of water. For a larger supply, add  cup of bleach to 1 gallon (16 cups) of water. Read labels of cleaning products and follow recommendations provided on product labels. Labels contain instructions for safe and effective use of the cleaning product including precautions you should take when applying the product, such as wearing gloves or eye protection and making sure you have good ventilation during use of the product. Remove gloves and wash hands immediately after cleaning.  Monitor yourself for signs and symptoms of illness Caregivers and household members are considered close contacts, should monitor their health, and will be asked to limit movement outside of the home to the extent possible. Follow the monitoring steps for close contacts listed on the symptom monitoring form.  If you have additional questions, contact your local health department or call the epidemiologist on call at [phone] (available 24/7). This guidance is subject to change. For the most up-to-date guidance from [facility], please refer to their website: [email or web address].pl   Increase activity slowly   Complete by: As directed    MyChart COVID-19 home monitoring program   Complete by: Mar 23, 2019    Is the patient willing to use the [place] for home monitoring?: Yes   Temperature monitoring   Complete by: Mar 23, 2019    After how many days would you like to receive a notification of this patient's flowsheet entries?: 1     Allergies as of 03/23/2019      Reactions   Ancef [cefazolin] Itching   Severe itching- after procedure, ancef was the antibiotic.-04/02/17   Sulfa Antibiotics Rash, Other (See Comments)   Blisters, also      Medication List    STOP taking these medications   Janumet XR 50-500 MG Tb24 Generic drug: SitaGLIPtin-MetFORMIN HCl   potassium chloride SA 20 MEQ tablet Commonly known as: K-DUR     TAKE  these medications   amiodarone 200 MG tablet Commonly known as: PACERONE TAKE 1 TABLET BY MOUTH EVERY DAY   amLODipine 10 MG tablet Commonly known as: NORVASC Take 1 tablet (10 mg total) by mouth daily.   atorvastatin 40 MG tablet Commonly known as: LIPITOR TAKE 1 TABLET BY MOUTH EVERY DAY   blood glucose meter kit and supplies Dispense based on patient and insurance preference. Use up to four times daily as directed. (FOR ICD-10 E10.9, E11.9).   Centrum Silver 50+Women Tabs Take 1 tablet by mouth daily.   colchicine 0.6 MG tablet TAKE 1 TABLET BY MOUTH TWICE A DAY AS NEEDED FOR GOUT What changed: See the new instructions.   furosemide 40 MG tablet Commonly known as: LASIX Take 1 tablet (40 mg total) by mouth daily.   glucose blood test strip Use as instructed   hydrALAZINE 100 MG tablet Commonly known  as: APRESOLINE Take 1 tablet (100 mg total) by mouth 3 (three) times daily.   insulin glargine 100 unit/mL Sopn Commonly known as: LANTUS Inject 0.15 mLs (15 Units total) into the skin daily.   Insulin Pen Needle 29G X 5MM Misc Use as directed   ketorolac 0.5 % ophthalmic solution Commonly known as: ACULAR Place 1 drop into both eyes at bedtime.   latanoprost 0.005 % ophthalmic solution Commonly known as: XALATAN Place 1 drop into both eyes at bedtime.   levothyroxine 50 MCG tablet Commonly known as: SYNTHROID Take 1 tablet by mouth daily in AM   metoprolol succinate 25 MG 24 hr tablet Commonly known as: TOPROL-XL Take 1 tablet (25 mg total) by mouth daily. STOP taking this unless heart rate >100bpm OR directed by your doctor What changed: additional instructions   sildenafil 20 MG tablet Commonly known as: REVATIO Take 1 tablet (20 mg total) by mouth 3 (three) times daily.   sitaGLIPtin 50 MG tablet Commonly known as: Januvia Take 1 tablet (50 mg total) by mouth daily.      Follow-up Information    Bensimhon, Shaune Pascal, MD Follow up in 2 week(s).    Specialty: Cardiology Why: Schedule follow-up appointment in 1 to 2 weeks with Dr. Glori Bickers CHF clinic.  Chronic diastolic CHF, pulmonary HTN, aortic stenosis Contact information: [address] [place] [address] [phone]        [name], FNP. Schedule an appointment as soon as possible for a visit in 1 day(s).   Specialty: General Practice Why: Call this week to follow up with telehealth visit specifically to address blood sugar elevation. Contact information: [address] [place] [address] [phone]        Lorretta Harp, MD .   Specialties: Cardiology, Radiology Contact information: [address] [place] [address] [phone]          Allergies  Allergen Reactions  . Ancef [Cefazolin] Itching    Severe itching- after procedure, ancef was the antibiotic.-04/02/17  . Sulfa Antibiotics Rash and Other (See Comments)    Blisters, also    Consultations:  None  Procedures/Studies: Ct Head Wo Contrast  Result Date: 03/18/2019 CLINICAL DATA:  Headache. EXAM: CT HEAD WITHOUT CONTRAST TECHNIQUE: Contiguous axial images were obtained from the base of the skull through the vertex without intravenous contrast. COMPARISON:  CT scan of May 20, 2018. FINDINGS: Brain: Mild chronic ischemic white matter disease is noted. No mass effect or midline shift is noted. Ventricular size is within normal limits. There is no evidence of mass lesion, hemorrhage or acute infarction. Vascular: No hyperdense vessel or unexpected calcification. Skull: Normal. Negative for fracture or focal lesion. Sinuses/Orbits: No acute finding. Other: None. IMPRESSION: Mild chronic ischemic white matter disease. No acute intracranial abnormality seen. Electronically Signed   By: Marijo Conception M.D.   On: 03/18/2019 08:45   Ir Removal Tun Access W/ Port W/o Fl  Result Date: 03/16/2019 INDICATION: 63 year old female with a history of  myelodysplastic syndrome. She has a port catheter which was placed by interventional radiology in September of 2018. The catheter has worked well, however she no longer uses it and therefore presents for removal. EXAM: REMOVAL RIGHT IJ VEIN PORT-A-CATH MEDICATIONS: 900 mg Cleocin; The antibiotic was administered within an appropriate time interval prior to skin puncture. ANESTHESIA/SEDATION: Moderate (conscious) sedation was employed during this procedure. A total of Versed 2 mg and Fentanyl 50 mcg was administered  intravenously. Moderate Sedation Time: 20 minutes. The patient's level of consciousness and vital signs were monitored continuously by radiology nursing throughout the procedure under my direct supervision. FLUOROSCOPY TIME:  None COMPLICATIONS: None immediate. PROCEDURE: Informed written consent was obtained from the patient after a thorough discussion of the procedural risks, benefits and alternatives. All questions were addressed. Maximal Sterile Barrier Technique was utilized including caps, mask, sterile gowns, sterile gloves, sterile drape, hand hygiene and skin antiseptic. A timeout was performed prior to the initiation of the procedure. The right chest was prepped and draped in a sterile fashion. Lidocaine was utilized for local anesthesia. An incision was made over the previously healed surgical incision. Utilizing blunt dissection, the port catheter and reservoir were removed from the underlying subcutaneous tissue in their entirety. Securing sutures were also removed. The pocket was irrigated with a copious amount of sterile normal saline. The pocket was closed with interrupted 3-0 Vicryl stitches. The subcutaneous tissue was closed with 3-0 Vicryl interrupted subcutaneous stitches. A 4-0 Vicryl running subcuticular stitch was utilized to approximate the skin. Dermabond was applied. IMPRESSION: Successful right IJ vein Port-A-Cath explant. Electronically Signed   By: Jacqulynn Cadet M.D.    On: 03/16/2019 14:23   Dg Chest Port 1 View  Result Date: 03/18/2019 CLINICAL DATA:  63 year old female with sudden onset of headache, migraines, and weakness. EXAM: PORTABLE CHEST 1 VIEW COMPARISON:  Chest radiograph dated 02/15/2019 FINDINGS: There has been interval removal of the Port-A-Cath. Bilateral perihilar vascular prominence likely mild vascular congestion. An area of increased density in the left upper lung field/left suprahilar region may represent developing infiltrate. No pleural effusion or pneumothorax. Stable cardiomegaly. Atherosclerotic calcification of the aortic arch. No acute osseous pathology. IMPRESSION: Left upper lung field opacity. Electronically Signed   By: Anner Crete M.D.   On: 03/18/2019 09:26    Subjective: Feels completely well. Eating well, walking well. Denies any shortness of breath, chest pain. Drinking plenty of water. Wants to go home.  Discharge Exam: Vitals:   03/23/19 0400 03/23/19 0719  BP:  (!) 166/44  Pulse: (!) 49 (!) 50  Resp: 15 14  Temp:  98.2 F (36.8 C)  SpO2: 100% 100%   General: Pt is alert, awake, not in acute distress Cardiovascular: Regular bradycardia w/III/VI holosystolic murmur greatest at base, throughout precordium. No gallop or rub. Respiratory: Nonlabored breathing ambient air, clear bilaterally Abdominal: Soft, NT, ND, bowel sounds + Extremities: No significant edema, no cyanosis  Labs: BNP (last 3 results) Recent Labs    05/11/18 0650 05/13/18 0333  BNP 2,681.1* 3,016.0*   Basic Metabolic Panel: Recent Labs  Lab 03/19/19 0253 03/20/19 0406 03/21/19 0303 03/22/19 0220 03/23/19 0318  NA 135 134* 133* 133* 128*  K 4.0 4.6 4.3 5.2* 4.5  CL 104 103 101 100 99  CO2 20* 19* 20* 20* 20*  GLUCOSE 324* 316* 279* 369* 430*  BUN 72* 79* 77* 90* 101*  CREATININE 2.38* 2.22* 2.18* 2.63* 2.77*  CALCIUM 8.7* 9.4 9.4 9.2 8.8*  MG  --  1.9 1.8 1.7  --   PHOS  --  3.5 3.3 3.8  --    Liver Function Tests: Recent  Labs  Lab 03/19/19 0253 03/20/19 0406 03/21/19 0303 03/22/19 0220 03/23/19 0318  AST 24 31 32 38 26  ALT '13 19 22 25 23  ' ALKPHOS 76 78 73 76 73  BILITOT 0.8 0.7 0.7 0.4 0.6  PROT 7.0 7.3 7.0 6.8 6.8  ALBUMIN 3.4* 3.6 3.3* 3.3* 3.3*  No results for input(s): LIPASE, AMYLASE in the last 168 hours. No results for input(s): AMMONIA in the last 168 hours. CBC: Recent Labs  Lab 03/19/19 0253 03/20/19 0406 03/21/19 0303 03/22/19 0220 03/23/19 0318  WBC 5.4 8.4 7.2 5.1 7.3  NEUTROABS 4.3 7.2 5.6 3.9 5.5  HGB 6.4* 7.8* 8.2* 8.2* 7.9*  HCT 19.4* 23.0* 23.9* 24.2* 23.7*  MCV 90.7 89.5 89.5 90.0 88.8  PLT 122* 132* 145* 144* 151   Cardiac Enzymes: No results for input(s): CKTOTAL, CKMB, CKMBINDEX, TROPONINI in the last 168 hours. BNP: Invalid input(s): POCBNP CBG: Recent Labs  Lab 03/22/19 0731 03/22/19 1154 03/22/19 1635 03/22/19 2002 03/23/19 0715  GLUCAP 316* 226* 242* 396* 365*   D-Dimer Recent Labs    03/21/19 0303 03/22/19 0220  DDIMER 0.56* 0.57*   Hgb A1c No results for input(s): HGBA1C in the last 72 hours. Lipid Profile No results for input(s): CHOL, HDL, LDLCALC, TRIG, CHOLHDL, LDLDIRECT in the last 72 hours. Thyroid function studies Recent Labs    03/21/19 0303  TSH 5.048*   Anemia work up Recent Labs    03/21/19 0303 03/22/19 0220  FERRITIN 2,284* 2,522*   Urinalysis    Component Value Date/Time   COLORURINE YELLOW 03/18/2019 [place] 03/18/2019 1337   LABSPEC 1.009 03/18/2019 1337   PHURINE 5.0 03/18/2019 1337   GLUCOSEU NEGATIVE 03/18/2019 1337   HGBUR NEGATIVE 03/18/2019 1337   BILIRUBINUR NEGATIVE 03/18/2019 1337   [place] 03/18/2019 1337   PROTEINUR 100 (A) 03/18/2019 1337   NITRITE NEGATIVE 03/18/2019 [place] 03/18/2019 1337    Microbiology Recent Results (from the past 240 hour(s))  Novel Coronavirus, NAA ([facility])     Status: None   Collection Time: 03/17/19 12:00 AM    Specimen: Oropharyngeal(OP) collection in vial transport medium   OROPHARYNGEA  TESTING  Result Value Ref Range Status   SARS-CoV-2, NAA Not Detected Not Detected Final    Comment: This nucleic acid amplification test was developed and its performance characteristics determined by [employer]. Nucleic acid amplification tests include PCR and TMA. This test has not been FDA cleared or approved. This test has been authorized by FDA under an Emergency Use Authorization (EUA). This test is only authorized for the duration of time the declaration that circumstances exist justifying the authorization of the emergency use of in vitro diagnostic tests for detection of SARS-CoV-2 virus and/or diagnosis of COVID-19 infection under section 564(b)(1) of the Act, 21 U.S.C. 824MPN-3(I) (1), unless the authorization is terminated or revoked sooner. When diagnostic testing is negative, the possibility of a false negative result should be considered in the context of a patient's recent exposures and the presence of clinical signs and symptoms consistent with COVID-19. An individual without symptoms of COVID-19 and who is not shedding SARS-CoV-2 virus would  expect to have a negative (not detected) result in this assay.   Blood Culture (routine x 2)     Status: None (Preliminary result)   Collection Time: 03/18/19  9:28 AM   Specimen: BLOOD  Result Value Ref Range Status   Specimen Description   Final    BLOOD LEFT ANTECUBITAL Performed at [facility], [place] [address] [place], [place] [address]    Special Requests   Final    BOTTLES DRAWN AEROBIC AND ANAEROBIC Blood Culture adequate volume Performed at [place] [address] [place], [place] [address]    Culture PENDING  Incomplete   Report  Status PENDING  Incomplete  SARS Coronavirus 2 [facility] order, Performed in [facility] hospital lab) Nasopharyngeal Nasopharyngeal Swab     Status: Abnormal    Collection Time: 03/18/19  9:39 AM   Specimen: Nasopharyngeal Swab  Result Value Ref Range Status   SARS Coronavirus 2 POSITIVE (A) NEGATIVE Final    Comment: RESULT CALLED TO, READ BACK BY AND VERIFIED WITH: [place] FAWZE,PA [id number] @ 4034 BY J SCOTTON (NOTE) If result is NEGATIVE SARS-CoV-2 target nucleic acids are NOT DETECTED. The SARS-CoV-2 RNA is generally detectable in upper and lower  respiratory specimens during the acute phase of infection. The lowest  concentration of SARS-CoV-2 viral copies this assay can detect is 250  copies / mL. A negative result does not preclude SARS-CoV-2 infection  and should not be used as the sole basis for treatment or other  patient management decisions.  A negative result may occur with  improper specimen collection / handling, submission of specimen other  than nasopharyngeal swab, presence of viral mutation(s) within the  areas targeted by this assay, and inadequate number of viral copies  (<250 copies / mL). A negative result must be combined with clinical  observations, patient history, and epidemiological information. If result is POSITIVE SARS-CoV-2 target nucleic acids are DETECTE D. The SARS-CoV-2 RNA is generally detectable in upper and lower  respiratory specimens during the acute phase of infection.  Positive  results are indicative of active infection with SARS-CoV-2.  Clinical  correlation with patient history and other diagnostic information is  necessary to determine patient infection status.  Positive results do  not rule out bacterial infection or co-infection with other viruses. If result is PRESUMPTIVE POSTIVE SARS-CoV-2 nucleic acids MAY BE PRESENT.   A presumptive positive result was obtained on the submitted specimen  and confirmed on repeat testing.  While 2019 novel coronavirus  (SARS-CoV-2) nucleic acids may be present in the submitted sample  additional confirmatory testing may be necessary for epidemiological  and /  or clinical management purposes  to differentiate between  SARS-CoV-2 and other Sarbecovirus currently known to infect humans.  If clinically indicated additional testing with an alternate test  methodology (LAB745 3) is advised. The SARS-CoV-2 RNA is generally  detectable in upper and lower respiratory specimens during the acute  phase of infection. The expected result is Negative. Fact Sheet for Patients:  StrictlyIdeas.no Fact Sheet for Healthcare Providers: BankingDealers.co.za This test is not yet approved or cleared by the [place] FDA and has been authorized for detection and/or diagnosis of SARS-CoV-2 by FDA under an Emergency Use Authorization (EUA).  This EUA will remain in effect (meaning this test can be used) for the duration of the COVID-19 declaration under Section 564(b)(1) of the Act, 21 U.S.C. section 360bbb-3(b)(1), unless the authorization is terminated or revoked sooner. Performed at [facility], [place] [address] [place], [place] [address]   Blood Culture (routine x 2)     Status: None   Collection Time: 03/18/19  1:03 PM   Specimen: BLOOD  Result Value Ref Range Status   Specimen Description   Final    BLOOD LEFT ARM Performed at [place] [address], [place], [place] [address]    Special Requests   Final    BOTTLES DRAWN AEROBIC AND ANAEROBIC Blood Culture adequate volume Performed at [place] [address] [place], [place] [address]    Culture   Final    NO GROWTH 5 DAYS Performed  at [facility], [place] [address], [place], [place] [address]    Report Status 03/23/2019 FINAL  Final  Urine culture     Status: None   Collection Time: 03/18/19  1:37 PM   Specimen: Urine, Random  Result Value Ref Range Status   Specimen Description   Final    URINE, RANDOM Performed at [place] [address]  [place], [place] [address]    Special Requests   Final    NONE Performed at [facility], [place] [address], [place], [place] [address]    Culture   Final    Multiple bacterial morphotypes present, none predominant. Suggest appropriate recollection if clinically indicated.   Report Status 03/19/2019 FINAL  Final    Time coordinating discharge: Approximately 40 minutes  Patrecia Pour, MD  [facility] 03/23/2019, 10:28 AM

## 2019-03-24 ENCOUNTER — Telehealth: Payer: Self-pay

## 2019-03-24 ENCOUNTER — Telehealth (HOSPITAL_COMMUNITY): Payer: Self-pay | Admitting: *Deleted

## 2019-03-24 ENCOUNTER — Other Ambulatory Visit: Payer: Self-pay

## 2019-03-24 MED ORDER — LEVOTHYROXINE SODIUM 75 MCG PO TABS: 75.0000 ug | ORAL_TABLET | Freq: Every day | ORAL | 0 refills | Status: DC

## 2019-03-24 NOTE — Telephone Encounter (Signed)
Pt aware and thanks our team for a quick response.

## 2019-03-24 NOTE — Telephone Encounter (Signed)
Transition Care Management Follow-up Telephone Call  Date of discharge and from where: 03/23/2019 from [facility]  How have you been since you were released from the hospital? Been well  Any questions or concerns? No   Items Reviewed:  Did the pt receive and understand the discharge instructions provided? Yes   Medications obtained and verified? Yes   Any new allergies since your discharge? No   Dietary orders reviewed? Yes  Do you have support at home? Yes   Other (ie: DME, Home Health, etc) none  Functional Questionnaire: (I = Independent and D = Dependent) ADL's: I  Bathing/Dressing- I   Meal Prep- I  Eating- I  Maintaining continence- I  Transferring/Ambulation- I  Managing Meds- I   Follow up appointments reviewed:    PCP Hospital f/u appt confirmed? Yes  Scheduled to see J.Moore  on 03/29/2019 @ 12:00.  Are transportation arrangements needed? No   If their condition worsens, is the pt aware to call  their PCP or go to the ED? Yes  Was the patient provided with contact information for the PCP's office or ED? Yes  Was the pt encouraged to call back with questions or concerns? Yes

## 2019-03-24 NOTE — Telephone Encounter (Signed)
I looked at her d/c meds and like what they did.  Hope she feels better.

## 2019-03-24 NOTE — Telephone Encounter (Signed)
Received call from patient stating that she was COVID + on 9/3 and just dc'd from the hospital. She stated that she received 1 unit PRBC's inpatient and feels good. She is aware of her next appointment on 9/29.

## 2019-03-24 NOTE — Telephone Encounter (Signed)
Pt called stating she was discharged from the hospital yesterday after dealing with covid 19. Pt said a lot of her medications were changed and she does not want to make any changes to her heart medications unless Dr.Bensimhon agrees with the plan.   Routed to Dr. Haroldine Laws for advice

## 2019-03-29 ENCOUNTER — Encounter: Payer: Self-pay | Admitting: Nurse Practitioner

## 2019-03-29 ENCOUNTER — Other Ambulatory Visit: Payer: Self-pay

## 2019-03-29 ENCOUNTER — Telehealth (INDEPENDENT_AMBULATORY_CARE_PROVIDER_SITE_OTHER): Payer: BC Managed Care – PPO | Admitting: Nurse Practitioner

## 2019-03-29 ENCOUNTER — Ambulatory Visit: Payer: BC Managed Care – PPO

## 2019-03-29 ENCOUNTER — Inpatient Hospital Stay: Payer: Self-pay | Admitting: Nurse Practitioner

## 2019-03-29 VITALS — Ht 64.0 in | Wt 174.0 lb

## 2019-03-29 DIAGNOSIS — U071 COVID-19: Secondary | ICD-10-CM

## 2019-03-29 DIAGNOSIS — E1122 Type 2 diabetes mellitus with diabetic chronic kidney disease: Secondary | ICD-10-CM

## 2019-03-29 DIAGNOSIS — Z09 Encounter for follow-up examination after completed treatment for conditions other than malignant neoplasm: Secondary | ICD-10-CM

## 2019-03-29 DIAGNOSIS — B342 Coronavirus infection, unspecified: Secondary | ICD-10-CM

## 2019-03-29 DIAGNOSIS — N184 Chronic kidney disease, stage 4 (severe): Secondary | ICD-10-CM

## 2019-03-29 DIAGNOSIS — E119 Type 2 diabetes mellitus without complications: Secondary | ICD-10-CM

## 2019-03-29 DIAGNOSIS — E039 Hypothyroidism, unspecified: Secondary | ICD-10-CM

## 2019-03-29 NOTE — Patient Instructions (Signed)
Patricia Holmesis a63 y.o.femalewith a history of T2DM, stage IV CKD, colon CA in remission s/p resection and chemotherapy, severe aortic stenosis, chronic HFpEF, PAF, hypothyroidism, and transfusion-dependent anemia of chronic disease and myelodysplastic syndrome. She presented 9/3 from home, where she was helping her husband recover from covid-19 after his discharge from this hospital, with multiple symptoms including fever, chills, myalgias, cough, and headache. On presentation she was hypoxic with progressive worsening requiring 10L HFNC with LUL opacity on CXR, positive SARS-CoV-2 PCR, and elevated inflammatory markers. Steroids and remdesivir started, and the patient admitted to [facility].The patient showed rapid improvement in symptoms, resolution of hypoxia, and sustained diminution of inflammatory markers. See below for further details.   Past Medical History:  Diagnosis Date  . Acute kidney injury (nontraumatic) ([place])   . Acute renal failure (ARF) ([place]) 06/08/2018  . Cancer [facility]) 2008   Colon   . Diabetes mellitus without complication ([place])   . Gout 09/08/2018  . Hypertension   . Macrocytic anemia    Past Surgical History:  Procedure Laterality Date  . COLON SURGERY    . DEBRIDMENT OF DECUBITUS ULCER N/A 06/12/2018   Procedure: DEBRIDMENT OF DECUBITUS ULCER;  Surgeon: Wallace Going, DO;  Location: [facility];  Service: Plastics;  Laterality: N/A;  . IR FLUORO GUIDE PORT INSERTION RIGHT  04/02/2017  . IR REMOVAL TUN ACCESS W/ PORT W/O FL MOD SED  03/16/2019  . IR US GUIDE VASC ACCESS RIGHT  04/02/2017  . RIGHT/LEFT HEART CATH AND CORONARY ANGIOGRAPHY N/A 05/21/2018   Procedure: RIGHT/LEFT HEART CATH AND CORONARY ANGIOGRAPHY;  Surgeon: Nelva Bush, MD;  Location: [facility] CV LAB;  Service: Cardiovascular;  Laterality: N/A;     Current Meds  Medication Sig  . amiodarone (PACERONE) 200 MG tablet TAKE 1 TABLET BY MOUTH EVERY DAY (Patient taking differently: Take 200 mg  by mouth daily. )  . amLODipine (NORVASC) 10 MG tablet Take 1 tablet (10 mg total) by mouth daily.  Marland Kitchen atorvastatin (LIPITOR) 40 MG tablet TAKE 1 TABLET BY MOUTH EVERY DAY  . blood glucose meter kit and supplies Dispense based on patient and insurance preference. Use up to four times daily as directed. (FOR ICD-10 E10.9, E11.9).  . colchicine 0.6 MG tablet TAKE 1 TABLET BY MOUTH TWICE A DAY AS NEEDED FOR GOUT (Patient taking differently: Take 0.6 mg by mouth 2 (two) times daily as needed (gout). )  . furosemide (LASIX) 40 MG tablet Take 1 tablet (40 mg total) by mouth daily.  Marland Kitchen glucose blood test strip Use as instructed  . hydrALAZINE (APRESOLINE) 100 MG tablet Take 1 tablet (100 mg total) by mouth 3 (three) times daily.  . insulin glargine (LANTUS) 100 unit/mL SOPN Inject 0.15 mLs (15 Units total) into the skin daily.  . Insulin Pen Needle 29G X 5MM MISC Use as directed  . ketorolac (ACULAR) 0.5 % ophthalmic solution Place 1 drop into both eyes at bedtime.   Marland Kitchen latanoprost (XALATAN) 0.005 % ophthalmic solution Place 1 drop into both eyes at bedtime.   Marland Kitchen levothyroxine (SYNTHROID) 75 MCG tablet Take 1 tablet (75 mcg total) by mouth daily.  . Multiple Vitamins-Minerals (CENTRUM SILVER 50+WOMEN) TABS Take 1 tablet by mouth daily.  . sildenafil (REVATIO) 20 MG tablet Take 1 tablet (20 mg total) by mouth 3 (three) times daily.  . sitaGLIPtin (JANUVIA) 50 MG tablet Take 1 tablet (50 mg total) by mouth daily.     Allergies:   Ancef [cefazolin]  and Sulfa antibiotics   Social History   Tobacco Use  . Smoking status: Former Smoker    Packs/day: 0.25    Years: 20.00    Pack years: 5.00    Types: Cigarettes    Quit date: 01/31/2001    Years since quitting: 18.1  . Smokeless tobacco: Former Network engineer Use Topics  . Alcohol use: Yes    Alcohol/week: 2.0 standard drinks    Types: 2 Shots of liquor per week  . Drug use: Never     Family Hx: The patient's family history includes Cervical  cancer in her mother; Diabetes in her mother; HIV/AIDS in her brother; Heart attack in her father; Hypertension in her brother, mother, sister, sister, and sister; Prostate cancer in her brother.  ROS:   Please see the history of present illness.    _0 @  All other systems reviewed and are negative.   Labs/Other Tests and Data Reviewed:    Recent Labs: 05/13/2018: B Natriuretic Peptide 3,571.3 03/21/2019: TSH 5.048 03/22/2019: Magnesium 1.7 03/23/2019: ALT 23; BUN 101; Creatinine, Ser 2.77; Hemoglobin 7.9; Platelets 151; Potassium 4.5; Sodium 128   Recent Lipid Panel Lab Results  Component Value Date/Time   CHOL 143 03/20/2019 04:06 AM   TRIG 43 03/20/2019 04:06 AM   HDL 48 03/20/2019 04:06 AM   CHOLHDL 3.0 03/20/2019 04:06 AM   LDLCALC 86 03/20/2019 04:06 AM    Wt Readings from Last 3 Encounters:  03/29/19 174 lb (78.9 kg)  03/23/19 180 lb 8.9 oz (81.9 kg)  03/11/19 180 lb (81.6 kg)     Exam:    Vital Signs:  Ht _1  (1.626 m)   Wt 174 lb (78.9 kg)   BMI 29.87 kg/m     _2 @  ASSESSMENT & PLAN:     There are no diagnoses linked to this encounter.

## 2019-04-01 ENCOUNTER — Encounter (INDEPENDENT_AMBULATORY_CARE_PROVIDER_SITE_OTHER): Payer: Self-pay

## 2019-04-02 ENCOUNTER — Encounter (INDEPENDENT_AMBULATORY_CARE_PROVIDER_SITE_OTHER): Payer: Self-pay

## 2019-04-02 ENCOUNTER — Other Ambulatory Visit: Payer: Self-pay

## 2019-04-02 ENCOUNTER — Other Ambulatory Visit: Payer: Self-pay | Admitting: Nurse Practitioner

## 2019-04-02 DIAGNOSIS — Z20822 Contact with and (suspected) exposure to covid-19: Secondary | ICD-10-CM

## 2019-04-02 DIAGNOSIS — E1165 Type 2 diabetes mellitus with hyperglycemia: Secondary | ICD-10-CM

## 2019-04-03 ENCOUNTER — Encounter (INDEPENDENT_AMBULATORY_CARE_PROVIDER_SITE_OTHER): Payer: Self-pay

## 2019-04-03 LAB — NOVEL CORONAVIRUS, NAA: SARS-CoV-2, NAA: NOT DETECTED

## 2019-04-04 NOTE — Progress Notes (Signed)
Virtual Visit via Telephone   This visit type was conducted due to national recommendations for restrictions regarding the COVID-19 Pandemic (e.g. social distancing) in an effort to limit this patient's exposure and mitigate transmission in our community.  Due to her co-morbid illnesses, this patient is at least at moderate risk for complications without adequate follow up.  This format is felt to be most appropriate for this patient at this time.  All issues noted in this document were discussed and addressed.  A limited physical exam was performed with this format.    This visit type was conducted due to national recommendations for restrictions regarding the COVID-19 Pandemic (e.g. social distancing) in an effort to limit this patient's exposure and mitigate transmission in our community.  Patients identity confirmed using two different identifiers.  This format is felt to be most appropriate for this patient at this time.  All issues noted in this document were discussed and addressed.  No physical exam was performed (except for noted visual exam findings with Video Visits).    Date:  04/04/2019   ID:  Patricia Mata, DOB Aug 17, 1955, MRN [id number]  Patient Location:  Home - spoke with Patricia Mata  Provider location:   Office    Chief Complaint:  Hospital follow up for coronavirus  History of Present Illness:    Patricia Mata is a 63 y.o. female who presents via video conferencing for a telehealth visit today.    The patient does not have symptoms concerning for COVID-19 infection (fever, chills, cough, or new shortness of breath).   Patricia Mata is a 63 y/o AA female who is having a visit via telephone after being [facility] to [facility] from [facility]8 after presenting to the ED after helping care for her husband recover from covid-19 after he was discharged to home. She had multiple symptoms including fever, chills, myalgias, cough and headache. Upon her arrival she was hypoxic  with progressive worsening requiring 10 L High Flow [place], she had LUL Opacity and positive for SARS-CoV-2 PCR, and elevated inflammatory markers. She was started on steroids and remdesivir started. She improved rapidly and her inflammatory markers decreased.  Her blood sugar increased during this time and was to restart her insulin she only has Hospital doctor at home. Her thyroid levels remained elevated as well.    She has been checking her blood sugar if over 150 will take 5 units of tresiba. This morning was 175, basaglar.      Past Medical History:  Diagnosis Date  . Acute kidney injury (nontraumatic) (H[facility])   . Acute renal failure (ARF) ([facility]) 06/08/2018  . Cancer [facility]) 2008   Colon   . Diabetes mellitus without complication ([facility])   . Gout 09/08/2018  . Hypertension   . Macrocytic anemia    Past Surgical History:  Procedure Laterality Date  . COLON SURGERY    . DEBRIDMENT OF DECUBITUS ULCER N/A 06/12/2018   Procedure: DEBRIDMENT OF DECUBITUS ULCER;  Surgeon: Peggye Form, DO;  Location: [facility];  Service: Plastics;  Laterality: N/A;  . IR FLUORO GUIDE PORT INSERTION RIGHT  04/02/2017  . IR REMOVAL TUN ACCESS W/ PORT W/O FL MOD SED  03/16/2019  . IR US GUIDE VASC ACCESS RIGHT  04/02/2017  . RIGHT/LEFT HEART CATH AND CORONARY ANGIOGRAPHY N/A 05/21/2018   Procedure: RIGHT/LEFT HEART CATH AND CORONARY ANGIOGRAPHY;  Surgeon: Yvonne Kendall, MD;  [facility];  Service: Cardiovascular;  Laterality: N/A;     Current Meds  Medication Sig  . amiodarone (PACERONE) 200 MG tablet TAKE 1 TABLET BY MOUTH EVERY DAY (Patient taking differently: Take 200 mg by mouth daily. )  . amLODipine (NORVASC) 10 MG tablet Take 1 tablet (10 mg total) by mouth daily.  Marland Kitchen atorvastatin (LIPITOR) 40 MG tablet TAKE 1 TABLET BY MOUTH EVERY DAY  . blood glucose meter kit and supplies Dispense based on patient and insurance preference. Use up to four times daily as directed. (FOR ICD-10 E10.9, E11.9).  .  colchicine 0.6 MG tablet TAKE 1 TABLET BY MOUTH TWICE A DAY AS NEEDED FOR GOUT (Patient taking differently: Take 0.6 mg by mouth 2 (two) times daily as needed (gout). )  . furosemide (LASIX) 40 MG tablet Take 1 tablet (40 mg total) by mouth daily.  Marland Kitchen glucose blood test strip Use as instructed  . hydrALAZINE (APRESOLINE) 100 MG tablet Take 1 tablet (100 mg total) by mouth 3 (three) times daily.  . insulin glargine (LANTUS) 100 unit/mL SOPN Inject 0.15 mLs (15 Units total) into the skin daily.  . Insulin Pen Needle 29G X MISC Use as directed  . ketorolac (ACULAR) 0.5 % ophthalmic solution Place 1 drop into both eyes at bedtime.   Marland Kitchen latanoprost (XALATAN) 0.005 % ophthalmic solution Place 1 drop into both eyes at bedtime.   Marland Kitchen levothyroxine (SYNTHROID) 75 MCG tablet Take 1 tablet (75 mcg total) by mouth daily.  . Multiple Vitamins-Minerals (CENTRUM SILVER 50+WOMEN) TABS Take 1 tablet by mouth daily.  . sildenafil (REVATIO) 20 MG tablet Take 1 tablet (20 mg total) by mouth 3 (three) times daily.  . sitaGLIPtin (JANUVIA) 50 MG tablet Take 1 tablet (50 mg total) by mouth daily.     Allergies:   Ancef [cefazolin] and Sulfa antibiotics   Social History   Tobacco Use  . Smoking status: Former Smoker    Packs/day: 0.25    Years: 20.00    Pack years: 5.00    Types: Cigarettes    Quit date: 01/31/2001    Years since quitting: 18.1  . Smokeless tobacco: Former Engineer, water Use Topics  . Alcohol use: Yes    Alcohol/week: 2.0 standard drinks    Types: 2 Shots of liquor per week  . Drug use: Never     Family Hx: The patient's family history includes Cervical cancer in her mother; Diabetes in her mother; HIV/AIDS in her brother; Heart attack in her father; Hypertension in her brother, mother, sister, sister, and sister; Prostate cancer in her brother.  ROS:   Please see the history of present illness.    Review of Systems  Constitutional: Negative.   Respiratory: Negative.  Negative for  cough, sputum production and shortness of breath.   Cardiovascular: Negative.   Genitourinary: Negative.   Neurological: Negative for dizziness and tingling.  Endo/Heme/Allergies: Negative for polydipsia.  Psychiatric/Behavioral: Negative.     All other systems reviewed and are negative.   Labs/Other Tests and Data Reviewed:    Recent Labs: 05/13/2018: B Natriuretic Peptide 3,571.3 03/21/2019: TSH 5.048 03/22/2019: Magnesium 1.7 03/23/2019: ALT 23; BUN 101; Creatinine, Ser 2.77; Hemoglobin 7.9; Platelets 151; Potassium 4.5; Sodium 128   Recent Lipid Panel Lab Results  Component Value Date/Time   CHOL 143 03/20/2019 04:06 AM   TRIG 43 03/20/2019 04:06 AM   HDL 48 03/20/2019 04:06 AM   CHOLHDL 3.0 03/20/2019 04:06 AM   LDLCALC 86 03/20/2019 04:06 AM    Wt Readings from Last 3 Encounters:  03/29/19 174 lb (78.9  kg)  03/23/19 180 lb 8.9 oz (81.9 kg)  03/11/19 180 lb (81.6 kg)     Exam:    Vital Signs:  Ht 5\' 4"  (1.626 m)   Wt 174 lb (78.9 kg)   BMI 29.87 kg/m     Physical Exam  Constitutional: She is oriented to person, place, and time. No distress.  Neurological: She is alert and oriented to person, place, and time.  Psychiatric: Mood, memory, affect and judgment normal.    ASSESSMENT & PLAN:     1. Coronavirus infection TCM Performed. A member of the clinical team spoke with the patient upon dischare. Discharge summary was reviewed in full detail during the visit. Meds reconciled and compared to discharge meds. Medication list is updated and reviewed with the patient.  Greater than 50% face to face time was spent in counseling an coordination of care.  All questions were answered to the satisfaction of the patient.   She is doing much better and declines having any additional symptoms. She is to go for a retest on next Friday which is her 14th day of quarantine. - Novel Coronavirus, NAA ([facility])  2. Stage 4 chronic kidney disease (HCC)  She has had further decline  with her kidney function  I will refer to nephrology for further evaluation - Ambulatory referral to Nephrology  3. Type 2 diabetes mellitus without complication, without long-term current use of insulin (HCC)  Blood sugar has increased due to steroid use, she is to use Basaglar 5 units once a day if her blood sugar decreased below 150 she can hold the medication  I imagine this will only be temporary as she has not been using the insulin in quite some time  She is now on Januvia due to kidney function decreased taken off metformin - CMP14 + Anion Gap; Future - Hemoglobin A1c; Future  4. Acquired hypothyroidism  She is to return in 6 weeks to recheck thyroid levels.   COVID-19 Education: The signs and symptoms of COVID-19 were discussed with the patient and how to seek care for testing (follow up with PCP or arrange E-visit).  The importance of social distancing was discussed today.  Patient Risk:   After full review of this patients clinical status, I feel that they are at least moderate risk at this time.  Time:   Today, I have spent 20 minutes/ seconds with the patient with telehealth technology discussing above diagnoses.     Medication Adjustments/Labs and Tests Ordered: Current medicines are reviewed at length with the patient today.  Concerns regarding medicines are outlined above.   Tests Ordered: Orders Placed This Encounter  Procedures  . Novel Coronavirus, NAA ([facility])  . CMP14 + Anion Gap  . CBC no Diff  . Hemoglobin A1c  . Ambulatory referral to Nephrology    Medication Changes: No orders of the defined types were placed in this encounter.   Disposition:  Follow up labs next week  Signed, Arnette Felts, FNP

## 2019-04-13 ENCOUNTER — Other Ambulatory Visit: Payer: Self-pay

## 2019-04-13 ENCOUNTER — Inpatient Hospital Stay: Payer: BC Managed Care – PPO

## 2019-04-13 ENCOUNTER — Telehealth: Payer: Self-pay

## 2019-04-13 ENCOUNTER — Inpatient Hospital Stay: Payer: BC Managed Care – PPO | Attending: Oncology | Admitting: Oncology

## 2019-04-13 VITALS — BP 138/50 | HR 57 | Temp 98.6°F | Resp 18

## 2019-04-13 VITALS — BP 148/47 | HR 70 | Temp 98.5°F | Resp 18 | Ht 64.0 in | Wt 179.3 lb

## 2019-04-13 DIAGNOSIS — D464 Refractory anemia, unspecified: Secondary | ICD-10-CM | POA: Insufficient documentation

## 2019-04-13 DIAGNOSIS — D469 Myelodysplastic syndrome, unspecified: Secondary | ICD-10-CM

## 2019-04-13 DIAGNOSIS — Z23 Encounter for immunization: Secondary | ICD-10-CM | POA: Diagnosis not present

## 2019-04-13 DIAGNOSIS — D631 Anemia in chronic kidney disease: Secondary | ICD-10-CM

## 2019-04-13 DIAGNOSIS — N189 Chronic kidney disease, unspecified: Secondary | ICD-10-CM

## 2019-04-13 LAB — SAMPLE TO BLOOD BANK

## 2019-04-13 LAB — CBC WITH DIFFERENTIAL (CANCER CENTER ONLY)
Abs Immature Granulocytes: 0.04 10*3/uL (ref 0.00–0.07)
Basophils Absolute: 0 10*3/uL (ref 0.0–0.1)
Basophils Relative: 0 %
Eosinophils Absolute: 0.1 10*3/uL (ref 0.0–0.5)
Eosinophils Relative: 1 %
HCT: 18.2 % — ABNORMAL LOW (ref 36.0–46.0)
Hemoglobin: 5.9 g/dL — CL (ref 12.0–15.0)
Immature Granulocytes: 1 %
Lymphocytes Relative: 12 %
Lymphs Abs: 0.8 10*3/uL (ref 0.7–4.0)
MCH: 29.9 pg (ref 26.0–34.0)
MCHC: 32.4 g/dL (ref 30.0–36.0)
MCV: 92.4 fL (ref 80.0–100.0)
Monocytes Absolute: 1 10*3/uL (ref 0.1–1.0)
Monocytes Relative: 16 %
Neutro Abs: 4.7 10*3/uL (ref 1.7–7.7)
Neutrophils Relative %: 70 %
Platelet Count: 158 10*3/uL (ref 150–400)
RBC: 1.97 MIL/uL — ABNORMAL LOW (ref 3.87–5.11)
RDW: 18.4 % — ABNORMAL HIGH (ref 11.5–15.5)
WBC Count: 6.7 10*3/uL (ref 4.0–10.5)
nRBC: 0.7 % — ABNORMAL HIGH (ref 0.0–0.2)

## 2019-04-13 LAB — PREPARE RBC (CROSSMATCH)

## 2019-04-13 MED ORDER — DIPHENHYDRAMINE HCL 25 MG PO TABS
25.0000 mg | ORAL_TABLET | Freq: Once | ORAL | Status: AC
  Administered 2019-04-13: 25 mg via ORAL
  Filled 2019-04-13: qty 1

## 2019-04-13 MED ORDER — SODIUM CHLORIDE 0.9% IV SOLUTION
250.0000 mL | Freq: Once | INTRAVENOUS | Status: AC
  Administered 2019-04-13: 250 mL via INTRAVENOUS
  Filled 2019-04-13: qty 250

## 2019-04-13 MED ORDER — INFLUENZA VAC SPLIT QUAD 0.5 ML IM SUSY
0.5000 mL | PREFILLED_SYRINGE | Freq: Once | INTRAMUSCULAR | Status: AC
  Administered 2019-04-13: 0.5 mL via INTRAMUSCULAR

## 2019-04-13 MED ORDER — DIPHENHYDRAMINE HCL 25 MG PO CAPS
ORAL_CAPSULE | ORAL | Status: AC
  Filled 2019-04-13: qty 1

## 2019-04-13 MED ORDER — FUROSEMIDE 10 MG/ML IJ SOLN
INTRAMUSCULAR | Status: AC
  Filled 2019-04-13: qty 2

## 2019-04-13 MED ORDER — ACETAMINOPHEN 325 MG PO TABS
ORAL_TABLET | ORAL | Status: AC
  Filled 2019-04-13: qty 2

## 2019-04-13 MED ORDER — ACETAMINOPHEN 325 MG PO TABS
650.0000 mg | ORAL_TABLET | Freq: Once | ORAL | Status: AC
  Administered 2019-04-13: 10:00:00 650 mg via ORAL

## 2019-04-13 MED ORDER — INFLUENZA VAC SPLIT QUAD 0.5 ML IM SUSY
PREFILLED_SYRINGE | INTRAMUSCULAR | Status: AC
  Filled 2019-04-13: qty 0.5

## 2019-04-13 MED ORDER — FUROSEMIDE 10 MG/ML IJ SOLN
20.0000 mg | Freq: Once | INTRAMUSCULAR | Status: AC
  Administered 2019-04-13: 20 mg via INTRAVENOUS

## 2019-04-13 NOTE — Telephone Encounter (Signed)
Dr. Alen Blew made aware of hemoglobin 5.9.   2 units of packed red blood cells ordered. Infusion nurse made aware.

## 2019-04-13 NOTE — Progress Notes (Signed)
Hematology and Oncology Follow Up Visit  Patricia Mata [id number] [birth year] 63 y.o. 04/13/2019 9:04 AM   Principle Diagnosis: 63 year old woman with:   1.  Colon cancer diagnosed in 2008.  She remains in remission at this time after initial diagnosis with stage III disease.  2.  Myelodysplastic syndrome (MDS) diagnosed in 2017.  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    Prior Therapy: She is S/P hemicolectomy done in 11/2006. 1/13 lymph nodes are positive.  This was followed by 12 cycles of FOLFOX completed in 05/2007  Aranesp 300 mcg every 2 to 3 weeks therapy was ineffective to eliminate transfusion needs.  5-azacytidine at 75 mg/m square subcutaneously started in October 2018.  She is status post 7 cycles of therapy completed in April 2019.   Current therapy: Transfusion for supportive care only.    Interim History:  Patricia Mata returns today for a repeat evaluation.  Since her last visit, she was hospitalized briefly after a confirmed diagnosis of COVID-19.  She recovered reasonably well and a repeat viral testing for the novel coronavirus was negative on 04/02/2019.  Since her discharge, she has recovered reasonably well without any residual complaints.  She denies any fevers or chills or sweats.  She denies respiratory complaints at this time.  She still has some fatigue and tiredness but no other complaints.   She denied any alteration mental status, neuropathy, confusion or dizziness.  Denies any headaches or lethargy.  Denies any night sweats, weight loss or changes in appetite.  Denied orthopnea, dyspnea on exertion or chest discomfort.  Denies shortness of breath, difficulty breathing hemoptysis or cough.  Denies any abdominal distention, nausea, early satiety or dyspepsia.  Denies any hematuria, frequency, dysuria or nocturia.  Denies any skin irritation, dryness or rash.  Denies any ecchymosis or petechiae.  Denies any  lymphadenopathy or clotting.  Denies any heat or cold intolerance.  Denies any anxiety or depression.  Remaining review of system is negative.              Medications: Without any changes on review today.  Allergies:  Allergies  Allergen Reactions  . Ancef [Cefazolin] Itching    Severe itching- after procedure, ancef was the antibiotic.-04/02/17  . Sulfa Antibiotics Rash and Other (See Comments)    Blisters, also     Physical Exam:   Blood pressure (!) 148/47, pulse 70, temperature 98.5 F (36.9 C), temperature source Temporal, resp. rate 18, height _0  (1.626 m), weight 179 lb 4.8 oz (81.3 kg), SpO2 100 %.     ECOG: 2     General appearance: Comfortable appearing without any discomfort Head: Normocephalic without any trauma Oropharynx: Mucous membranes are moist and pink without any thrush or ulcers. Eyes: Pupils are equal and round reactive to light. Lymph nodes: No cervical, supraclavicular, inguinal or axillary lymphadenopathy.   Heart:regular rate and rhythm.  S1 and S2.  Ejection murmur auscultated without lower extremity edema. Lung: Clear without any rhonchi or wheezes.  No dullness to percussion. Abdomin: Soft, nontender, nondistended with good bowel sounds.  No hepatosplenomegaly. Musculoskeletal: No joint deformity or effusion.  Full range of motion noted. Neurological: No deficits noted on motor, sensory and deep tendon reflex exam. Skin: No petechial rash or dryness.  Appeared moist.  Psychiatric: Mood and affect appeared appropriate.            Lab Results: Lab Results  Component Value Date  WBC 7.3 03/23/2019   HGB 7.9 (L) 03/23/2019   HCT 23.7 (L) 03/23/2019   MCV 88.8 03/23/2019   PLT 151 03/23/2019     Chemistry      Component Value Date/Time   NA 128 (L) 03/23/2019 0318   NA 141 09/09/2018 1230   NA 134 (L) 05/26/2017 0949   K 4.5 03/23/2019 0318   K 4.0 05/26/2017 0949   CL 99 03/23/2019 0318   CL 96 (L)  03/31/2012 1513   CO2 20 (L) 03/23/2019 0318   CO2 24 05/26/2017 0949   BUN 101 (H) 03/23/2019 0318   BUN 68 (H) 09/09/2018 1230   BUN 29.2 (H) 05/26/2017 0949   CREATININE 2.77 (H) 03/23/2019 0318   CREATININE 2.73 (H) 02/09/2019 1016   CREATININE 2.24 (H) 01/27/2019 1355   CREATININE 1.4 (H) 05/26/2017 0949      Component Value Date/Time   CALCIUM 8.8 (L) 03/23/2019 0318   CALCIUM 8.9 05/26/2017 0949   ALKPHOS 73 03/23/2019 0318   ALKPHOS 157 (H) 05/26/2017 0949   AST 26 03/23/2019 0318   AST 20 02/09/2019 1016   AST 29 05/26/2017 0949   ALT 23 03/23/2019 0318   ALT 8 02/09/2019 1016   ALT 23 05/26/2017 0949   BILITOT 0.6 03/23/2019 0318   BILITOT 0.5 02/09/2019 1016   BILITOT 2.19 (H) 05/26/2017 0949     Current Outpatient Medications on File Prior to Visit  Medication Sig Dispense Refill  . amiodarone (PACERONE) 200 MG tablet TAKE 1 TABLET BY MOUTH EVERY DAY (Patient taking differently: Take 200 mg by mouth daily. ) 90 tablet 1  . amLODipine (NORVASC) 10 MG tablet Take 1 tablet (10 mg total) by mouth daily. 90 tablet 3  . atorvastatin (LIPITOR) 40 MG tablet TAKE 1 TABLET BY MOUTH EVERY DAY 90 tablet 0  . blood glucose meter kit and supplies Dispense based on patient and insurance preference. Use up to four times daily as directed. (FOR ICD-10 E10.9, E11.9). 1 each 3  . colchicine 0.6 MG tablet TAKE 1 TABLET BY MOUTH TWICE A DAY AS NEEDED FOR GOUT (Patient taking differently: Take 0.6 mg by mouth 2 (two) times daily as needed (gout). ) 180 tablet 1  . furosemide (LASIX) 40 MG tablet Take 1 tablet (40 mg total) by mouth daily. 30 tablet 5  . glucose blood test strip Use as instructed 100 each 12  . hydrALAZINE (APRESOLINE) 100 MG tablet Take 1 tablet (100 mg total) by mouth 3 (three) times daily. 180 tablet 5  . insulin glargine (LANTUS) 100 unit/mL SOPN Inject 0.15 mLs (15 Units total) into the skin daily.    . Insulin Pen Needle 29G X 5MM MISC Use as directed 200 each 0  .  ketorolac (ACULAR) 0.5 % ophthalmic solution Place 1 drop into both eyes at bedtime.     Marland Kitchen latanoprost (XALATAN) 0.005 % ophthalmic solution Place 1 drop into both eyes at bedtime.     Marland Kitchen levothyroxine (SYNTHROID) 75 MCG tablet Take 1 tablet (75 mcg total) by mouth daily. 90 tablet 0  . metoprolol succinate (TOPROL-XL) 25 MG 24 hr tablet Take 1 tablet (25 mg total) by mouth daily. STOP taking this unless heart rate >100bpm OR directed by your doctor (Patient not taking: Reported on 03/29/2019) 30 tablet 5  . Multiple Vitamins-Minerals (CENTRUM SILVER 50+WOMEN) TABS Take 1 tablet by mouth daily.    . sildenafil (REVATIO) 20 MG tablet Take 1 tablet (20 mg total) by mouth 3 (  three) times daily. 270 tablet 3  . sitaGLIPtin (JANUVIA) 50 MG tablet Take 1 tablet (50 mg total) by mouth daily. 30 tablet 0   Current Facility-Administered Medications on File Prior to Visit  Medication Dose Route Frequency Provider Last Rate Last Dose  . sodium chloride flush (NS) 0.9 % injection 10 mL  10 mL Intravenous PRN [name], MD       Family history, social history and surgical history remains without any changes on review.  Impression and Plan:  63 year old woman with:   1.  Myelodysplastic syndrome that is refractory to treatment at this time and currently transfusion dependent.     The natural course of this disease as well as alternative treatment options were discussed.  At this time we have elected to continue with supportive management.  She will continue to receive as needed packed red cell transfusion.  Long-term complication associated with transfusion dependence was reviewed.  2.  Anemia: Hemoglobin is down today and will require 2 units of packed red cell transfusion.  3. Hypertension: Her blood pressure is back to normal range.   4.  Aortic stenosis: No exacerbation noted at this time.  She continues to follow with cardiology without any decompensated heart failure.  A will have no objection  for any corrective procedure for her aortic stenosis.  No contraindication for anticoagulation from my standpoint.  5.  Prognosis: Her disease is incurable and currently receiving supportive management although his performance status reasonable and aggressive measures are warranted.  Plan to continue treating  6. Follow-up: She will return in 3 weeks for repeat evaluation and possible transfusion.  She will have MD follow-up in 6 weeks.  25  minutes was spent with the patient face-to-face today.  More than 50% of time was dedicated to updating her disease status, treatment options and complications related to therapy.   Zola Button, MD 9/29/20209:04 AM

## 2019-04-13 NOTE — Progress Notes (Signed)
Order from Dr. Alen Blew, okay to give Lasix 20 MG IV now for blood transfusion.

## 2019-04-13 NOTE — Patient Instructions (Signed)
Blood Transfusion, Adult, Care After This sheet gives you information about how to care for yourself after your procedure. Your doctor may also give you more specific instructions. If you have problems or questions, contact your doctor. Follow these instructions at home:   Take over-the-counter and prescription medicines only as told by your doctor.  Go back to your normal activities as told by your doctor.  Follow instructions from your doctor about how to take care of the area where an IV tube was put into your vein (insertion site). Make sure you: ? Wash your hands with soap and water before you change your bandage (dressing). If there is no soap and water, use hand sanitizer. ? Change your bandage as told by your doctor.  Check your IV insertion site every day for signs of infection. Check for: ? More redness, swelling, or pain. ? More fluid or blood. ? Warmth. ? Pus or a bad smell. Contact a doctor if:  You have more redness, swelling, or pain around the IV insertion site.  You have more fluid or blood coming from the IV insertion site.  Your IV insertion site feels warm to the touch.  You have pus or a bad smell coming from the IV insertion site.  Your pee (urine) turns pink, red, or brown.  You feel weak after doing your normal activities. Get help right away if:  You have signs of a serious allergic or body defense (immune) system reaction, including: ? Itchiness. ? Hives. ? Trouble breathing. ? Anxiety. ? Pain in your chest or lower back. ? Fever, flushing, and chills. ? Fast pulse. ? Rash. ? Watery poop (diarrhea). ? Throwing up (vomiting). ? Dark pee. ? Serious headache. ? Dizziness. ? Stiff neck. ? Yellow color in your face or the white parts of your eyes (jaundice). Summary  After a blood transfusion, return to your normal activities as told by your doctor.  Every day, check for signs of infection where the IV tube was put into your vein.  Some  signs of infection are warm skin, more redness and pain, more fluid or blood, and pus or a bad smell where the needle went in.  Contact your doctor if you feel weak or have any unusual symptoms. This information is not intended to replace advice given to you by your health care provider. Make sure you discuss any questions you have with your health care provider. Document Released: 07/22/2014 Document Revised: 11/05/2017 Document Reviewed: 02/23/2016 Elsevier Patient Education  [place].  Influenza Virus Vaccine injection What is this medicine? INFLUENZA VIRUS VACCINE (in floo EN zuh VAHY ruhs vak SEEN) helps to reduce the risk of getting influenza also known as the flu. The vaccine only helps protect you against some strains of the flu. This medicine may be used for other purposes; ask your health care provider or pharmacist if you have questions. COMMON BRAND NAME(S): Afluria, Afluria Quadrivalent, Agriflu, Alfuria, FLUAD, Fluarix, Fluarix Quadrivalent, Flublok, Flublok Quadrivalent, FLUCELVAX, Flulaval, Fluvirin, Fluzone, Fluzone High-Dose, Fluzone Intradermal What should I tell my health care provider before I take this medicine? They need to know if you have any of these conditions:  bleeding disorder like hemophilia  fever or infection  Guillain-Barre syndrome or other neurological problems  immune system problems  infection with the human immunodeficiency virus (HIV) or AIDS  low blood platelet counts  multiple sclerosis  an unusual or allergic reaction to influenza virus vaccine, latex, other medicines, foods, dyes, or preservatives. Different brands  of vaccines contain different allergens. Some may contain latex or eggs. Talk to your doctor about your allergies to make sure that you get the right vaccine.  pregnant or trying to get pregnant  breast-feeding How should I use this medicine? This vaccine is for injection into a muscle or under the skin. It is given  by a health care professional. A copy of Vaccine Information Statements will be given before each vaccination. Read this sheet carefully each time. The sheet may change frequently. Talk to your healthcare provider to see which vaccines are right for you. Some vaccines should not be used in all age groups. Overdosage: If you think you have taken too much of this medicine contact a poison control center or emergency room at once. NOTE: This medicine is only for you. Do not share this medicine with others. What if I miss a dose? This does not apply. What may interact with this medicine?  chemotherapy or radiation therapy  medicines that lower your immune system like etanercept, anakinra, infliximab, and adalimumab  medicines that treat or prevent blood clots like warfarin  phenytoin  steroid medicines like prednisone or cortisone  theophylline  vaccines This list may not describe all possible interactions. Give your health care provider a list of all the medicines, herbs, non-prescription drugs, or dietary supplements you use. Also tell them if you smoke, drink alcohol, or use illegal drugs. Some items may interact with your medicine. What should I watch for while using this medicine? Report any side effects that do not go away within 3 days to your doctor or health care professional. Call your health care provider if any unusual symptoms occur within 6 weeks of receiving this vaccine. You may still catch the flu, but the illness is not usually as bad. You cannot get the flu from the vaccine. The vaccine will not protect against colds or other illnesses that may cause fever. The vaccine is needed every year. What side effects may I notice from receiving this medicine? Side effects that you should report to your doctor or health care professional as soon as possible:  allergic reactions like skin rash, itching or hives, swelling of the face, lips, or tongue Side effects that usually do not  require medical attention (report to your doctor or health care professional if they continue or are bothersome):  fever  headache  muscle aches and pains  pain, tenderness, redness, or swelling at the injection site  tiredness This list may not describe all possible side effects. Call your doctor for medical advice about side effects. You may report side effects to FDA at 1-800-FDA-1088. Where should I keep my medicine? The vaccine will be given by a health care professional in a clinic, pharmacy, doctor's office, or other health care setting. You will not be given vaccine doses to store at home. NOTE: This sheet is a summary. It may not cover all possible information. If you have questions about this medicine, talk to your doctor, pharmacist, or health care provider.  2020 Elsevier/Gold Standard (2018-05-26 08:45:43)  Coronavirus (COVID-19) Are you at risk?  Are you at risk for the Coronavirus (COVID-19)?  To be considered HIGH RISK for Coronavirus (COVID-19), you have to meet the following criteria:  . Traveled to [place], [place], [place], [place] or [place]; or in the [place] to [place], [place], [place], or [place]; and have fever, cough, and shortness of breath within the last 2 weeks of travel OR . Been in close  contact with a person diagnosed with COVID-19 within the last 2 weeks and have fever, cough, and shortness of breath . IF YOU DO NOT MEET THESE CRITERIA, YOU ARE CONSIDERED LOW RISK FOR COVID-19.  What to do if you are HIGH RISK for COVID-19?  Marland Kitchen If you are having a medical emergency, call 911. . Seek medical care right away. Before you go to a doctor's office, urgent care or emergency department, call ahead and tell them about your recent travel, contact with someone diagnosed with COVID-19, and your symptoms. You should receive instructions from your physician's office regarding next steps of care.  . When you arrive at healthcare provider, tell the  healthcare staff immediately you have returned from visiting [place], [place], [place], [place] or [place]; or traveled in the [place] to [place], [place], [place], or [place]; in the last two weeks or you have been in close contact with a person diagnosed with COVID-19 in the last 2 weeks.   . Tell the health care staff about your symptoms: fever, cough and shortness of breath. . After you have been seen by a medical provider, you will be either: o Tested for (COVID-19) and discharged home on quarantine except to seek medical care if symptoms worsen, and asked to  - Stay home and avoid contact with others until you get your results (4-5 days)  - Avoid travel on public transportation if possible (such as bus, train, or airplane) or o Sent to the Emergency Department by EMS for evaluation, COVID-19 testing, and possible admission depending on your condition and test results.  What to do if you are LOW RISK for COVID-19?  Reduce your risk of any infection by using the same precautions used for avoiding the common cold or flu:  Marland Kitchen Wash your hands often with soap and warm water for at least 20 seconds.  If soap and water are not readily available, use an alcohol-based hand sanitizer with at least 60% alcohol.  . If coughing or sneezing, cover your mouth and nose by coughing or sneezing into the elbow areas of your shirt or coat, into a tissue or into your sleeve (not your hands). . Avoid shaking hands with others and consider head nods or verbal greetings only. . Avoid touching your eyes, nose, or mouth with unwashed hands.  . Avoid close contact with people who are sick. . Avoid places or events with large numbers of people in one location, like concerts or sporting events. . Carefully consider travel plans you have or are making. . If you are planning any travel outside or inside the [place], visit the CDC's Travelers' Health webpage for the latest health notices. . If you have some symptoms  but not all symptoms, continue to monitor at home and seek medical attention if your symptoms worsen. . If you are having a medical emergency, call 911.   [facility] / e-Visit: [email or web address]         [facility]: [facility]: [phone]                   [facility]: [phone]

## 2019-04-14 ENCOUNTER — Telehealth: Payer: Self-pay | Admitting: Oncology

## 2019-04-14 LAB — TYPE AND SCREEN
ABO/RH(D): A NEG
Antibody Screen: POSITIVE
Unit division: 0
Unit division: 0

## 2019-04-14 LAB — BPAM RBC
Blood Product Expiration Date: 202010172359
Blood Product Expiration Date: 202010192359
ISSUE DATE / TIME: 202009291132
ISSUE DATE / TIME: 202009291132
Unit Type and Rh: 600
Unit Type and Rh: 600

## 2019-04-14 NOTE — Telephone Encounter (Signed)
Called and spoke with patient. Confirmed appts  °

## 2019-04-15 ENCOUNTER — Other Ambulatory Visit: Payer: Self-pay

## 2019-04-15 MED ORDER — SITAGLIPTIN PHOSPHATE 50 MG PO TABS: 50.0000 mg | ORAL_TABLET | Freq: Every day | ORAL | 0 refills | Status: DC

## 2019-04-16 ENCOUNTER — Ambulatory Visit: Payer: BC Managed Care – PPO | Admitting: Podiatry

## 2019-04-17 ENCOUNTER — Other Ambulatory Visit: Payer: Self-pay | Admitting: Nurse Practitioner

## 2019-04-17 DIAGNOSIS — J302 Other seasonal allergic rhinitis: Secondary | ICD-10-CM

## 2019-04-19 ENCOUNTER — Other Ambulatory Visit: Payer: Self-pay

## 2019-04-19 ENCOUNTER — Other Ambulatory Visit: Payer: BC Managed Care – PPO

## 2019-04-19 ENCOUNTER — Other Ambulatory Visit: Payer: Self-pay | Admitting: Nurse Practitioner

## 2019-04-20 ENCOUNTER — Encounter (HOSPITAL_COMMUNITY): Payer: Self-pay | Admitting: Internal Medicine

## 2019-04-20 ENCOUNTER — Other Ambulatory Visit (HOSPITAL_COMMUNITY): Payer: Self-pay | Admitting: *Deleted

## 2019-04-20 ENCOUNTER — Other Ambulatory Visit (HOSPITAL_COMMUNITY)
Admission: RE | Admit: 2019-04-20 | Discharge: 2019-04-20 | Disposition: A | Discharge status: Home / Self Care | Payer: BC Managed Care – PPO | Source: Ambulatory Visit | Attending: Internal Medicine | Admitting: Internal Medicine

## 2019-04-20 ENCOUNTER — Encounter (HOSPITAL_COMMUNITY): Payer: Self-pay | Admitting: *Deleted

## 2019-04-20 ENCOUNTER — Ambulatory Visit (HOSPITAL_COMMUNITY)
Admission: RE | Admit: 2019-04-20 | Discharge: 2019-04-20 | Disposition: A | Discharge status: Home / Self Care | Payer: BC Managed Care – PPO | Source: Ambulatory Visit | Attending: Internal Medicine | Admitting: Internal Medicine

## 2019-04-20 VITALS — BP 164/60 | HR 67 | Wt 180.2 lb

## 2019-04-20 DIAGNOSIS — Z79890 Hormone replacement therapy (postmenopausal): Secondary | ICD-10-CM | POA: Insufficient documentation

## 2019-04-20 DIAGNOSIS — M868X8 Other osteomyelitis, other site: Secondary | ICD-10-CM | POA: Diagnosis not present

## 2019-04-20 DIAGNOSIS — E1122 Type 2 diabetes mellitus with diabetic chronic kidney disease: Secondary | ICD-10-CM | POA: Insufficient documentation

## 2019-04-20 DIAGNOSIS — Z85038 Personal history of other malignant neoplasm of large intestine: Secondary | ICD-10-CM | POA: Diagnosis not present

## 2019-04-20 DIAGNOSIS — I272 Pulmonary hypertension, unspecified: Secondary | ICD-10-CM

## 2019-04-20 DIAGNOSIS — I270 Primary pulmonary hypertension: Secondary | ICD-10-CM | POA: Diagnosis present

## 2019-04-20 DIAGNOSIS — Z87891 Personal history of nicotine dependence: Secondary | ICD-10-CM | POA: Insufficient documentation

## 2019-04-20 DIAGNOSIS — Z794 Long term (current) use of insulin: Secondary | ICD-10-CM | POA: Insufficient documentation

## 2019-04-20 DIAGNOSIS — Z20828 Contact with and (suspected) exposure to other viral communicable diseases: Secondary | ICD-10-CM | POA: Insufficient documentation

## 2019-04-20 DIAGNOSIS — Z9049 Acquired absence of other specified parts of digestive tract: Secondary | ICD-10-CM | POA: Insufficient documentation

## 2019-04-20 DIAGNOSIS — I132 Hypertensive heart and chronic kidney disease with heart failure and with stage 5 chronic kidney disease, or end stage renal disease: Secondary | ICD-10-CM | POA: Insufficient documentation

## 2019-04-20 DIAGNOSIS — Z66 Do not resuscitate: Secondary | ICD-10-CM | POA: Insufficient documentation

## 2019-04-20 DIAGNOSIS — I5032 Chronic diastolic (congestive) heart failure: Secondary | ICD-10-CM | POA: Diagnosis not present

## 2019-04-20 DIAGNOSIS — I480 Paroxysmal atrial fibrillation: Secondary | ICD-10-CM | POA: Diagnosis not present

## 2019-04-20 DIAGNOSIS — Z79899 Other long term (current) drug therapy: Secondary | ICD-10-CM | POA: Insufficient documentation

## 2019-04-20 DIAGNOSIS — G4733 Obstructive sleep apnea (adult) (pediatric): Secondary | ICD-10-CM | POA: Insufficient documentation

## 2019-04-20 DIAGNOSIS — D469 Myelodysplastic syndrome, unspecified: Secondary | ICD-10-CM | POA: Insufficient documentation

## 2019-04-20 DIAGNOSIS — N184 Chronic kidney disease, stage 4 (severe): Secondary | ICD-10-CM | POA: Insufficient documentation

## 2019-04-20 LAB — CBC
HCT: 27 % — ABNORMAL LOW (ref 36.0–46.0)
Hemoglobin: 8.7 g/dL — ABNORMAL LOW (ref 12.0–15.0)
MCH: 31.2 pg (ref 26.0–34.0)
MCHC: 32.2 g/dL (ref 30.0–36.0)
MCV: 96.8 fL (ref 80.0–100.0)
Platelets: 179 10*3/uL (ref 150–400)
RBC: 2.79 MIL/uL — ABNORMAL LOW (ref 3.87–5.11)
RDW: 17 % — ABNORMAL HIGH (ref 11.5–15.5)
WBC: 6.1 10*3/uL (ref 4.0–10.5)
nRBC: 0 % (ref 0.0–0.2)

## 2019-04-20 LAB — CMP14 + ANION GAP
ALT: 12 IU/L (ref 0–32)
AST: 23 IU/L (ref 0–40)
Albumin/Globulin Ratio: 1.4 (ref 1.2–2.2)
Albumin: 4.3 g/dL (ref 3.8–4.8)
Alkaline Phosphatase: 81 IU/L (ref 39–117)
Anion Gap: 16 mmol/L (ref 10.0–18.0)
BUN/Creatinine Ratio: 27 (ref 12–28)
BUN: 67 mg/dL — ABNORMAL HIGH (ref 8–27)
Bilirubin Total: 0.7 mg/dL (ref 0.0–1.2)
CO2: 21 mmol/L (ref 20–29)
Calcium: 9.4 mg/dL (ref 8.7–10.3)
Chloride: 101 mmol/L (ref 96–106)
Creatinine, Ser: 2.5 mg/dL — ABNORMAL HIGH (ref 0.57–1.00)
GFR calc Af Amer: 23 mL/min/{1.73_m2} — ABNORMAL LOW (ref >59)
GFR calc non Af Amer: 20 mL/min/{1.73_m2} — ABNORMAL LOW (ref >59)
Globulin, Total: 3 g/dL (ref 1.5–4.5)
Glucose: 225 mg/dL — ABNORMAL HIGH (ref 65–99)
Potassium: 4.5 mmol/L (ref 3.5–5.2)
Sodium: 138 mmol/L (ref 134–144)
Total Protein: 7.3 g/dL (ref 6.0–8.5)

## 2019-04-20 LAB — HEMOGLOBIN A1C
Est. average glucose Bld gHb Est-mCnc: 140 mg/dL
Hgb A1c MFr Bld: 6.5 % — ABNORMAL HIGH (ref 4.8–5.6)

## 2019-04-20 LAB — BASIC METABOLIC PANEL
Anion gap: 10 (ref 5–15)
BUN: 67 mg/dL — ABNORMAL HIGH (ref 8–23)
CO2: 23 mmol/L (ref 22–32)
Calcium: 9.8 mg/dL (ref 8.9–10.3)
Chloride: 102 mmol/L (ref 98–111)
Creatinine, Ser: 2.5 mg/dL — ABNORMAL HIGH (ref 0.44–1.00)
GFR calc Af Amer: 23 mL/min — ABNORMAL LOW (ref >60)
GFR calc non Af Amer: 20 mL/min — ABNORMAL LOW (ref >60)
Glucose, Bld: 194 mg/dL — ABNORMAL HIGH (ref 70–99)
Potassium: 4.9 mmol/L (ref 3.5–5.1)
Sodium: 135 mmol/L (ref 135–145)

## 2019-04-20 LAB — T3: T3, Total: 48 ng/dL — ABNORMAL LOW (ref 71–180)

## 2019-04-20 LAB — PROTIME-INR
INR: 1.1 (ref 0.8–1.2)
Prothrombin Time: 13.6 seconds (ref 11.4–15.2)

## 2019-04-20 LAB — T4, FREE: Free T4: 1.49 ng/dL (ref 0.82–1.77)

## 2019-04-20 LAB — TSH: TSH: 22.5 u[IU]/mL — ABNORMAL HIGH (ref 0.450–4.500)

## 2019-04-20 LAB — HEPATITIS C ANTIBODY: Hep C Virus Ab: 0.1 s/co ratio (ref 0.0–0.9)

## 2019-04-20 NOTE — Progress Notes (Signed)
Advanced Heart Failure Clinic Note   Referring Physician: PCP: [name], FNP PCP-Cardiologist: Quay Burow, MD  Hematologist: Dr. Alen Blew  HPI:  Patricia Mata is a 63 y.o. 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 Followed by Dr Gwenlyn Found for severe AS and pulmonary HTN.  Admitted on 05/10/18  with AF with RVR and recurrent anemia. Developed respiratory failure and intubated. She was transfused and placed on IV amio. Subsequently developed AKI/ESRD and placed on CVVHD. Converted to NSR on amio and weaned off the vent.  Echoshowed normal LVEF 65-70% with moderate AS and moderate MS and severe PH with RVSP 49mHG. She is not a candidate for intervention of AS or MS with advanced PAH and transfusion-dependent myelodysplasia as a result of her colon CA  [facility] normal coron[facility] and severely elevated PA pressures.-> V/Q scan was negative. She was started on sildenafil and tolerated well. She diuresed with IV lasix and transitioned to lasix 40 mg PO BID. She did not require O2 at discharge. Palliative care was consulted and decision was made to transition to DNR.  She follows with Dr. SAlen Blewand requires transfusions every 2-3 weeks as an outpatient. She also developed a buttock wound. WOC was consulted and recommended hydrotherapy for coccygeal osteo   [facility] Last week hgb down to 5.9 and got 2u RBCs.  Here for routine f/u. Says she feels great. Getting 1u RBC about every 3 weeks. Denies melena or bleeding. Can do ADLs but legs very weak. Denies SOB. Just feels weak and fatigued. Legs get tired. No edema, orthopnea or PND.      Cardiac studies:  RHC 11/19  RA  14 RV  110/15 PA 105/40 (62) PCWP 25 Ao sat 91% PA sat 53% Fick CO/CI  6.8/4.3  PVR 5.4  AoV  Mean  269mG AVA 1.3 cm2  PFTs 8/20 FEV1 2.24 (112%) FVC 2.63 (103%) DLCO 56%  Echo 02/15/19 EF 65% RV normal size/function. Septal flattening. Severe AS (mean 4315m) RVSP 38m40m     Review of systems complete and found to be negative unless listed in HPI.    Past Medical History:  Diagnosis Date  . Acute kidney injury (nontraumatic) (HCC)[place]. Acute renal failure (ARF) (HCC)[place]/25/2019  [facility]   [facility]   . Diabetes mellitus without complication (HCC)[place]. Gout 09/08/2018  . Hypertension   . Macrocytic anemia     Current Outpatient Medications  Medication Sig Dispense Refill  . amiodarone (PACERONE) 200 MG tablet Take 200 mg by mouth daily.    . amMarland KitchenODipine (NORVASC) 10 MG tablet Take 1 tablet (10 mg total) by mouth daily. 90 tablet 3  . atorvastatin (LIPITOR) 40 MG tablet TAKE 1 TABLET BY MOUTH EVERY DAY 90 tablet 0  . blood glucose meter kit and supplies Dispense based on patient and insurance preference. Use up to four times daily as directed. (FOR ICD-10 E10.9, E11.9). 1 each 3  . colchicine 0.6 MG tablet Take 0.6 mg by mouth 2 (two) times daily as needed (for gout).    . furosemide (LASIX) 40 MG tablet Take 1 tablet (40 mg total) by mouth daily. 30 tablet 5  . glucose blood test strip Use as instructed 100 each 12  . hydrALAZINE (APRESOLINE) 100 MG tablet Take 1 tablet (100 mg total) by mouth  3 (three) times daily. 180 tablet 5  . insulin glargine (LANTUS) 100 unit/mL SOPN Inject 0.15 mLs (15 Units total) into the skin daily.    . Insulin Pen Needle 29G X 5MM MISC Use as directed 200 each 0  . ketorolac (ACULAR) 0.5 % ophthalmic solution Place 1 drop into both eyes at bedtime.     Marland Kitchen latanoprost (XALATAN) 0.005 % ophthalmic solution Place 1 drop into both eyes at bedtime.     Marland Kitchen levothyroxine (SYNTHROID) 75 MCG tablet Take 1 tablet (75 mcg total) by mouth daily. 90 tablet 0  . metoprolol succinate (TOPROL-XL) 25 MG 24 hr tablet Take 1 tablet (25 mg total) by mouth daily.  STOP taking this unless heart rate >100bpm OR directed by your doctor 30 tablet 5  . Multiple Vitamins-Minerals (CENTRUM SILVER 50+WOMEN) TABS Take 1 tablet by mouth daily.    . sildenafil (REVATIO) 20 MG tablet Take 1 tablet (20 mg total) by mouth 3 (three) times daily. 270 tablet 3  . sitaGLIPtin (JANUVIA) 50 MG tablet Take 1 tablet (50 mg total) by mouth daily. 90 tablet 0   No current facility-administered medications for this encounter.    Facility-Administered Medications Ordered in Other Encounters  Medication Dose Route Frequency Provider Last Rate Last Dose  . sodium chloride flush (NS) 0.9 % injection 10 mL  10 mL Intravenous PRN [name], MD        Allergies  Allergen Reactions  . Ancef [Cefazolin] Itching    Severe itching- after procedure, ancef was the antibiotic.-04/02/17  . Sulfa Antibiotics Rash and Other (See Comments)    Blisters, also      Social History   Socioeconomic History  . Marital status: Married    Spouse name: Not on file  . Number of children: Not on file  . Years of education: Not on file  . Highest education level: Not on file  Occupational History  . Not on file  Social Needs  . Financial resource strain: Not on file  . Food insecurity    Worry: Not on file    Inability: Not on file  . Transportation needs    Medical: Not on file    Non-medical: Not on file  Tobacco Use  . Smoking status: Former Smoker    Packs/day: 0.25    Years: 20.00    Pack years: 5.00    Types: Cigarettes    Quit date: 01/31/2001    Years since quitting: 18.2  . Smokeless tobacco: Former Network engineer and Sexual Activity  . Alcohol use: Yes    Alcohol/week: 2.0 standard drinks    Types: 2 Shots of liquor per week  . Drug use: Never  . Sexual activity: Not on file  Lifestyle  . Physical activity    Days per week: Not on file    Minutes per session: Not on file  . Stress: Not on file  Relationships  . Social Herbalist on phone: Not on  file    Gets together: Not on file    Attends religious service: Not on file    Active member of club or organization: Not on file    Attends meetings of clubs or organizations: Not on file    Relationship status: Not on file  . Intimate partner violence    Fear of current or ex partner: Not on file    Emotionally abused: Not on file    Physically abused: Not on file  Forced sexual activity: Not on file  Other Topics Concern  . Not on file  Social History Narrative  . Not on file      Family History  Problem Relation Age of Onset  . Hypertension Mother   . Diabetes Mother   . Cervical cancer Mother   . Heart attack Father   . Hypertension Sister   . Hypertension Brother   . Hypertension Sister   . Hypertension Sister   . Prostate cancer Brother   . HIV/AIDS Brother    Vitals:   04/20/19 1112  BP: (!) 164/60  Pulse: 67  SpO2: 99%  Weight: 81.7 kg (180 lb 3.2 oz)    Wt Readings from Last 3 Encounters:  04/20/19 81.7 kg (180 lb 3.2 oz)  04/13/19 81.3 kg (179 lb 4.8 oz)  03/29/19 78.9 kg (174 lb)    PHYSICAL EXAM: General:  Sitting in chair. No resp difficulty HEENT: normal Neck: supple. no JVD. Carotids 2+ bilat; + bruits. No lymphadenopathy or thryomegaly appreciated. Cor: PMI nondisplaced. Regular rate & rhythm. 3/6 AS s2 ok  Lungs: clear Abdomen: soft, nontender, nondistended. No hepatosplenomegaly. No bruits or masses. Good bowel sounds. Extremities: no cyanosis, clubbing, rash, edema Neuro: alert & orientedx3, cranial nerves grossly intact. moves all 4 extremities w/o difficulty. Affect pleasant    ASSESSMENT & PLAN:  1. PAH - Echo 05/12/18 LVEF 65-70%, Grade 2 DD, Mod AS, Mild/Mod MS, Mod TR, Trivial PI, PA peak pressure 93.  - L/RHC 05/21/18 with no significant CAD, severe pulmonary HTN with PA 105/40 (62) PCW 33 PVR 5.4 WU - Suspect multifactorial in nature suspect including high output due to anemia, WHO group II (diastolic HF and valvular disease)  and III (OSA) but given degree of PH cannot exclude WHO Group I component. - Overall stable NYHA III - Echo 8/20 EF 65% with normal RV size and function. + septal flattening. RVSP 52mHG - VQ negative - PFTs 8/20 with normal spirometry and moderately reduced DLCO - Sleep study pending .  - Continue sildenafil 20 mg TID cautiously.  - Plan repeat RHC later this week to reassess PAH with consideration of ERA though need to be very careful with severe AS and high LV filling pressures - Volume status stable on exam.  - Continue lasix 40 mg daily.  2. Moderate AS/MS - Echo 8/20 with severe AS but on exam sounds more moderate c/w previous cath.  - She has seen Dr. SAlen Blewand he has made it clear that she has a terminal illness but he would have no issue with using Plavix if needed - That said I do not believe AS is her primary issue here and with CKD 4 and severe PAH likely not TAVR candidate  3. PAF - Remains in NSR on amio. Did not tolerate AF well at all. - Continue amiodarone 200 daily - Continue Toprol XL 25 mg daily (was on BID but cut back due to bradycardia)  - Avoid  anticoagulation with transfusion dependent anemia. (chronic).  - CHA2DS2/VASc is at least 4.   4. CKD 4 - Cr running 2.2-2.7  - was 2.5 yesterday   5. Transfusion dependent anemia in setting of Myelodysplastic syndrome -[place]fuse as needed per [place]  - Hgb 5.9 on 04/13/19 now s/p 2u RBCs  6. HTN - Improved but still a bit elevated - Continue hydral 100 mg TID.  - Continue amlodipine to 10 mg daily.  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  - Resolved   Glori Bickers, MD 04/20/19

## 2019-04-20 NOTE — H&P (View-Only) (Signed)
Advanced Heart Failure Clinic Note   Referring Physician: PCP: [name], FNP PCP-Cardiologist: Quay Burow, MD  Hematologist: Dr. Alen Blew  HPI:  Patricia Mata is a 63 y.o. 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 Followed by Dr Gwenlyn Found for severe AS and pulmonary HTN.  Admitted on 05/10/18  with AF with RVR and recurrent anemia. Developed respiratory failure and intubated. She was transfused and placed on IV amio. Subsequently developed AKI/ESRD and placed on CVVHD. Converted to NSR on amio and weaned off the vent.  Echoshowed normal LVEF 65-70% with moderate AS and moderate MS and severe PH with RVSP 49mHG. She is not a candidate for intervention of AS or MS with advanced PAH and transfusion-dependent myelodysplasia as a result of her colon CA  [facility] showed normal coronaries and severely elevated PA pressures.-> V/Q scan was negative. She was started on sildenafil and tolerated well. She diuresed with IV lasix and transitioned to lasix 40 mg PO BID. She did not require O2 at discharge. Palliative care was consulted and decision was made to transition to DNR.  She follows with Dr. SAlen Blewand requires transfusions every 2-3 weeks as an outpatient. She also developed a buttock wound. WOC was consulted and recommended hydrotherapy for coccygeal osteo   [facility] Last week hgb down to 5.9 and got 2u RBCs.  Here for routine f/u. Says she feels great. Getting 1u RBC about every 3 weeks. Denies melena or bleeding. Can do ADLs but legs very weak. Denies SOB. Just feels weak and fatigued. Legs get tired. No edema, orthopnea or PND.      Cardiac studies:  RHC 11/19  RA  14 RV  110/15 PA 105/40 (62) PCWP 25 Ao sat 91% PA sat 53% Fick CO/CI  6.8/4.3  PVR 5.4  AoV  Mean  269mG AVA 1.3 cm2  PFTs 8/20 FEV1 2.24 (112%) FVC 2.63 (103%) DLCO 56%  Echo 02/15/19 EF 65% RV normal size/function. Septal flattening. Severe AS (mean 4315m) RVSP 38m40m     Review of systems complete and found to be negative unless listed in HPI.    Past Medical History:  Diagnosis Date  . Acute kidney injury (nontraumatic) (HCC)[place]. Acute renal failure (ARF) (HCC)[place]/25/2019  [facility]   [facility]on   . Diabetes mellitus without complication (HCC)[place]. Gout 09/08/2018  . Hypertension   . Macrocytic anemia     Current Outpatient Medications  Medication Sig Dispense Refill  . amiodarone (PACERONE) 200 MG tablet Take 200 mg by mouth daily.    . amMarland KitchenODipine (NORVASC) 10 MG tablet Take 1 tablet (10 mg total) by mouth daily. 90 tablet 3  . atorvastatin (LIPITOR) 40 MG tablet TAKE 1 TABLET BY MOUTH EVERY DAY 90 tablet 0  . blood glucose meter kit and supplies Dispense based on patient and insurance preference. Use up to four times daily as directed. (FOR ICD-10 E10.9, E11.9). 1 each 3  . colchicine 0.6 MG tablet Take 0.6 mg by mouth 2 (two) times daily as needed (for gout).    . furosemide (LASIX) 40 MG tablet Take 1 tablet (40 mg total) by mouth daily. 30 tablet 5  . glucose blood test strip Use as instructed 100 each 12  . hydrALAZINE (APRESOLINE) 100 MG tablet Take 1 tablet (100 mg total) by mouth  3 (three) times daily. 180 tablet 5  . insulin glargine (LANTUS) 100 unit/mL SOPN Inject 0.15 mLs (15 Units total) into the skin daily.    . Insulin Pen Needle 29G X 5MM MISC Use as directed 200 each 0  . ketorolac (ACULAR) 0.5 % ophthalmic solution Place 1 drop into both eyes at bedtime.     Marland Kitchen latanoprost (XALATAN) 0.005 % ophthalmic solution Place 1 drop into both eyes at bedtime.     Marland Kitchen levothyroxine (SYNTHROID) 75 MCG tablet Take 1 tablet (75 mcg total) by mouth daily. 90 tablet 0  . metoprolol succinate (TOPROL-XL) 25 MG 24 hr tablet Take 1 tablet (25 mg total) by mouth daily.  STOP taking this unless heart rate >100bpm OR directed by your doctor 30 tablet 5  . Multiple Vitamins-Minerals (CENTRUM SILVER 50+WOMEN) TABS Take 1 tablet by mouth daily.    . sildenafil (REVATIO) 20 MG tablet Take 1 tablet (20 mg total) by mouth 3 (three) times daily. 270 tablet 3  . sitaGLIPtin (JANUVIA) 50 MG tablet Take 1 tablet (50 mg total) by mouth daily. 90 tablet 0   No current facility-administered medications for this encounter.    Facility-Administered Medications Ordered in Other Encounters  Medication Dose Route Frequency Provider Last Rate Last Dose  . sodium chloride flush (NS) 0.9 % injection 10 mL  10 mL Intravenous PRN [name], MD        Allergies  Allergen Reactions  . Ancef [Cefazolin] Itching    Severe itching- after procedure, ancef was the antibiotic.-04/02/17  . Sulfa Antibiotics Rash and Other (See Comments)    Blisters, also      Social History   Socioeconomic History  . Marital status: Married    Spouse name: Not on file  . Number of children: Not on file  . Years of education: Not on file  . Highest education level: Not on file  Occupational History  . Not on file  Social Needs  . Financial resource strain: Not on file  . Food insecurity    Worry: Not on file    Inability: Not on file  . Transportation needs    Medical: Not on file    Non-medical: Not on file  Tobacco Use  . Smoking status: Former Smoker    Packs/day: 0.25    Years: 20.00    Pack years: 5.00    Types: Cigarettes    Quit date: 01/31/2001    Years since quitting: 18.2  . Smokeless tobacco: Former Network engineer and Sexual Activity  . Alcohol use: Yes    Alcohol/week: 2.0 standard drinks    Types: 2 Shots of liquor per week  . Drug use: Never  . Sexual activity: Not on file  Lifestyle  . Physical activity    Days per week: Not on file    Minutes per session: Not on file  . Stress: Not on file  Relationships  . Social Herbalist on phone: Not on  file    Gets together: Not on file    Attends religious service: Not on file    Active member of club or organization: Not on file    Attends meetings of clubs or organizations: Not on file    Relationship status: Not on file  . Intimate partner violence    Fear of current or ex partner: Not on file    Emotionally abused: Not on file    Physically abused: Not on file  Forced sexual activity: Not on file  Other Topics Concern  . Not on file  Social History Narrative  . Not on file      Family History  Problem Relation Age of Onset  . Hypertension Mother   . Diabetes Mother   . Cervical cancer Mother   . Heart attack Father   . Hypertension Sister   . Hypertension Brother   . Hypertension Sister   . Hypertension Sister   . Prostate cancer Brother   . HIV/AIDS Brother    Vitals:   04/20/19 1112  BP: (!) 164/60  Pulse: 67  SpO2: 99%  Weight: 81.7 kg (180 lb 3.2 oz)    Wt Readings from Last 3 Encounters:  04/20/19 81.7 kg (180 lb 3.2 oz)  04/13/19 81.3 kg (179 lb 4.8 oz)  03/29/19 78.9 kg (174 lb)    PHYSICAL EXAM: General:  Sitting in chair. No resp difficulty HEENT: normal Neck: supple. no JVD. Carotids 2+ bilat; + bruits. No lymphadenopathy or thryomegaly appreciated. Cor: PMI nondisplaced. Regular rate & rhythm. 3/6 AS s2 ok  Lungs: clear Abdomen: soft, nontender, nondistended. No hepatosplenomegaly. No bruits or masses. Good bowel sounds. Extremities: no cyanosis, clubbing, rash, edema Neuro: alert & orientedx3, cranial nerves grossly intact. moves all 4 extremities w/o difficulty. Affect pleasant    ASSESSMENT & PLAN:  1. PAH - Echo 05/12/18 LVEF 65-70%, Grade 2 DD, Mod AS, Mild/Mod MS, Mod TR, Trivial PI, PA peak pressure 93.  - L/RHC 05/21/18 with no significant CAD, severe pulmonary HTN with PA 105/40 (62) PCW 33 PVR 5.4 WU - Suspect multifactorial in nature suspect including high output due to anemia, WHO group II (diastolic HF and valvular disease)  and III (OSA) but given degree of PH cannot exclude WHO Group I component. - Overall stable NYHA III - Echo 8/20 EF 65% with normal RV size and function. + septal flattening. RVSP 52mHG - VQ negative - PFTs 8/20 with normal spirometry and moderately reduced DLCO - Sleep study pending .  - Continue sildenafil 20 mg TID cautiously.  - Plan repeat RHC later this week to reassess PAH with consideration of ERA though need to be very careful with severe AS and high LV filling pressures - Volume status stable on exam.  - Continue lasix 40 mg daily.  2. Moderate AS/MS - Echo 8/20 with severe AS but on exam sounds more moderate c/w previous cath.  - She has seen Dr. SAlen Blewand he has made it clear that she has a terminal illness but he would have no issue with using Plavix if needed - That said I do not believe AS is her primary issue here and with CKD 4 and severe PAH likely not TAVR candidate  3. PAF - Remains in NSR on amio. Did not tolerate AF well at all. - Continue amiodarone 200 daily - Continue Toprol XL 25 mg daily (was on BID but cut back due to bradycardia)  - Avoid  anticoagulation with transfusion dependent anemia. (chronic).  - CHA2DS2/VASc is at least 4.   4. CKD 4 - Cr running 2.2-2.7  - was 2.5 yesterday   5. Transfusion dependent anemia in setting of Myelodysplastic syndrome -Transfuse as needed per [place]  - Hgb 5.9 on 04/13/19 now s/p 2u RBCs  6. HTN - Improved but still a bit elevated - Continue hydral 100 mg TID.  - Continue amlodipine to 10 mg daily.  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  - Resolved   Glori Bickers, MD 04/20/19

## 2019-04-20 NOTE — Patient Instructions (Signed)
Heart Catheterization on Friday 04/23/2019, see instruction sheet  Your physician recommends that you schedule a follow-up appointment in: 4-6 weeks  If you have any questions or concerns before your next appointment please send [place] a message through [place] or call our office at [phone].  At the [facility], you and your health needs are our priority. As part of our continuing mission to provide you with exceptional heart care, we have created designated Provider Care Teams. These Care Teams include your primary Cardiologist (physician) and Advanced Practice Providers (APPs- Physician Assistants and Nurse Practitioners) who all work together to provide you with the care you need, when you need it.   You may see any of the following providers on your designated Care Team at your next follow up: Marland Kitchen Dr Glori Bickers . Dr Loralie Champagne . Darrick Grinder, NP   Please be sure to bring in all your medications bottles to every appointment.

## 2019-04-21 LAB — NOVEL CORONAVIRUS, NAA (HOSP ORDER, SEND-OUT TO REF LAB; TAT 18-24 HRS): SARS-CoV-2, NAA: NOT DETECTED

## 2019-04-23 ENCOUNTER — Other Ambulatory Visit: Payer: Self-pay

## 2019-04-23 ENCOUNTER — Ambulatory Visit (HOSPITAL_COMMUNITY)
Admission: RE | Admit: 2019-04-23 | Discharge: 2019-04-23 | Disposition: A | Discharge status: Home / Self Care | Payer: BC Managed Care – PPO | Attending: Internal Medicine | Admitting: Internal Medicine

## 2019-04-23 ENCOUNTER — Encounter (HOSPITAL_COMMUNITY)
Admission: RE | Disposition: A | Discharge status: Home / Self Care | Payer: Self-pay | Source: Home / Self Care | Attending: Internal Medicine

## 2019-04-23 DIAGNOSIS — I272 Pulmonary hypertension, unspecified: Secondary | ICD-10-CM | POA: Diagnosis present

## 2019-04-23 DIAGNOSIS — E669 Obesity, unspecified: Secondary | ICD-10-CM | POA: Insufficient documentation

## 2019-04-23 DIAGNOSIS — Z79899 Other long term (current) drug therapy: Secondary | ICD-10-CM | POA: Diagnosis not present

## 2019-04-23 DIAGNOSIS — I129 Hypertensive chronic kidney disease with stage 1 through stage 4 chronic kidney disease, or unspecified chronic kidney disease: Secondary | ICD-10-CM | POA: Diagnosis not present

## 2019-04-23 DIAGNOSIS — M868X8 Other osteomyelitis, other site: Secondary | ICD-10-CM | POA: Insufficient documentation

## 2019-04-23 DIAGNOSIS — Z79890 Hormone replacement therapy (postmenopausal): Secondary | ICD-10-CM | POA: Insufficient documentation

## 2019-04-23 DIAGNOSIS — D469 Myelodysplastic syndrome, unspecified: Secondary | ICD-10-CM | POA: Diagnosis not present

## 2019-04-23 DIAGNOSIS — I480 Paroxysmal atrial fibrillation: Secondary | ICD-10-CM | POA: Diagnosis not present

## 2019-04-23 DIAGNOSIS — N184 Chronic kidney disease, stage 4 (severe): Secondary | ICD-10-CM | POA: Diagnosis not present

## 2019-04-23 DIAGNOSIS — Z85038 Personal history of other malignant neoplasm of large intestine: Secondary | ICD-10-CM | POA: Diagnosis not present

## 2019-04-23 DIAGNOSIS — E1122 Type 2 diabetes mellitus with diabetic chronic kidney disease: Secondary | ICD-10-CM | POA: Insufficient documentation

## 2019-04-23 DIAGNOSIS — Z87891 Personal history of nicotine dependence: Secondary | ICD-10-CM | POA: Insufficient documentation

## 2019-04-23 DIAGNOSIS — Z794 Long term (current) use of insulin: Secondary | ICD-10-CM | POA: Insufficient documentation

## 2019-04-23 DIAGNOSIS — Z6830 Body mass index (BMI) 30.0-30.9, adult: Secondary | ICD-10-CM | POA: Insufficient documentation

## 2019-04-23 DIAGNOSIS — M109 Gout, unspecified: Secondary | ICD-10-CM | POA: Insufficient documentation

## 2019-04-23 DIAGNOSIS — I2721 Secondary pulmonary arterial hypertension: Secondary | ICD-10-CM

## 2019-04-23 HISTORY — PX: RIGHT HEART CATH: CATH118263

## 2019-04-23 LAB — POCT I-STAT EG7
Acid-base deficit: 1 mmol/L (ref 0.0–2.0)
Acid-base deficit: 2 mmol/L (ref 0.0–2.0)
Acid-base deficit: 2 mmol/L (ref 0.0–2.0)
Bicarbonate: 22.7 mmol/L (ref 20.0–28.0)
Bicarbonate: 23.8 mmol/L (ref 20.0–28.0)
Bicarbonate: 22.9 mmol/L (ref 20.0–28.0)
Calcium, Ion: 1.24 mmol/L (ref 1.15–1.40)
Calcium, Ion: 1.3 mmol/L (ref 1.15–1.40)
Calcium, Ion: 1.25 mmol/L (ref 1.15–1.40)
HCT: 23 % — ABNORMAL LOW (ref 36.0–46.0)
HCT: 24 % — ABNORMAL LOW (ref 36.0–46.0)
HCT: 23 % — ABNORMAL LOW (ref 36.0–46.0)
Hemoglobin: 7.8 g/dL — ABNORMAL LOW (ref 12.0–15.0)
Hemoglobin: 8.2 g/dL — ABNORMAL LOW (ref 12.0–15.0)
Hemoglobin: 7.8 g/dL — ABNORMAL LOW (ref 12.0–15.0)
O2 Saturation: 73 %
O2 Saturation: 75 %
O2 Saturation: 77 %
Potassium: 4.1 mmol/L (ref 3.5–5.1)
Potassium: 4.3 mmol/L (ref 3.5–5.1)
Potassium: 4.1 mmol/L (ref 3.5–5.1)
Sodium: 140 mmol/L (ref 135–145)
Sodium: 142 mmol/L (ref 135–145)
Sodium: 142 mmol/L (ref 135–145)
TCO2: 24 mmol/L (ref 22–32)
TCO2: 25 mmol/L (ref 22–32)
TCO2: 24 mmol/L (ref 22–32)
pCO2, Ven: 37 mmHg — ABNORMAL LOW (ref 44.0–60.0)
pCO2, Ven: 38.5 mmHg — ABNORMAL LOW (ref 44.0–60.0)
pCO2, Ven: 38.4 mmHg — ABNORMAL LOW (ref 44.0–60.0)
pH, Ven: 7.397 (ref 7.250–7.430)
pH, Ven: 7.399 (ref 7.250–7.430)
pH, Ven: 7.384 (ref 7.250–7.430)
pO2, Ven: 39 mmHg (ref 32.0–45.0)
pO2, Ven: 40 mmHg (ref 32.0–45.0)
pO2, Ven: 42 mmHg (ref 32.0–45.0)

## 2019-04-23 LAB — GLUCOSE, CAPILLARY: Glucose-Capillary: 159 mg/dL — ABNORMAL HIGH (ref 70–99)

## 2019-04-23 SURGERY — RIGHT HEART CATH
Anesthesia: LOCAL

## 2019-04-23 MED ORDER — LIDOCAINE HCL (PF) 1 % IJ SOLN
INTRAMUSCULAR | Status: AC
  Filled 2019-04-23: qty 30

## 2019-04-23 MED ORDER — LIDOCAINE HCL (PF) 1 % IJ SOLN
INTRAMUSCULAR | Status: DC | PRN
  Administered 2019-04-23: 2 mL

## 2019-04-23 MED ORDER — SODIUM CHLORIDE 0.9 % IV SOLN: 250.0000 mL | INTRAVENOUS | Status: DC | PRN

## 2019-04-23 MED ORDER — HYDRALAZINE HCL 20 MG/ML IJ SOLN: 10.0000 mg | INTRAMUSCULAR | Status: DC | PRN

## 2019-04-23 MED ORDER — SODIUM CHLORIDE 0.9 % IV SOLN
INTRAVENOUS | Status: DC
  Administered 2019-04-23: 12:00:00 via INTRAVENOUS

## 2019-04-23 MED ORDER — MIDAZOLAM HCL 2 MG/2ML IJ SOLN
INTRAMUSCULAR | Status: AC
  Filled 2019-04-23: qty 2

## 2019-04-23 MED ORDER — LABETALOL HCL 5 MG/ML IV SOLN: 10.0000 mg | INTRAVENOUS | Status: DC | PRN

## 2019-04-23 MED ORDER — HEPARIN (PORCINE) IN NACL 1000-0.9 UT/500ML-% IV SOLN
INTRAVENOUS | Status: AC
  Filled 2019-04-23: qty 500

## 2019-04-23 MED ORDER — HEPARIN (PORCINE) IN NACL 1000-0.9 UT/500ML-% IV SOLN
INTRAVENOUS | Status: DC | PRN
  Administered 2019-04-23: 500 mL

## 2019-04-23 MED ORDER — ACETAMINOPHEN 325 MG PO TABS: 650.0000 mg | ORAL_TABLET | ORAL | Status: DC | PRN

## 2019-04-23 MED ORDER — SODIUM CHLORIDE 0.9% FLUSH: 3.0000 mL | INTRAVENOUS | Status: DC | PRN

## 2019-04-23 MED ORDER — SODIUM CHLORIDE 0.9% FLUSH: 3.0000 mL | Freq: Two times a day (BID) | INTRAVENOUS | Status: DC

## 2019-04-23 MED ORDER — MIDAZOLAM HCL 2 MG/2ML IJ SOLN
INTRAMUSCULAR | Status: DC | PRN
  Administered 2019-04-23: 1 mg via INTRAVENOUS

## 2019-04-23 MED ORDER — FENTANYL CITRATE (PF) 100 MCG/2ML IJ SOLN
INTRAMUSCULAR | Status: DC | PRN
  Administered 2019-04-23: 25 ug via INTRAVENOUS

## 2019-04-23 MED ORDER — FENTANYL CITRATE (PF) 100 MCG/2ML IJ SOLN
INTRAMUSCULAR | Status: AC
  Filled 2019-04-23: qty 2

## 2019-04-23 MED ORDER — ONDANSETRON HCL 4 MG/2ML IJ SOLN: 4.0000 mg | Freq: Four times a day (QID) | INTRAMUSCULAR | Status: DC | PRN

## 2019-04-23 SURGICAL SUPPLY — 8 items
CATH BALLN WEDGE 5F 110CM (CATHETERS) ×1 IMPLANT
PACK CARDIAC CATHETERIZATION (CUSTOM PROCEDURE TRAY) ×2 IMPLANT
PROTECTION STATION PRESSURIZED (MISCELLANEOUS) ×2
SHEATH GLIDE SLENDER 4/5FR (SHEATH) ×1 IMPLANT
STATION PROTECTION PRESSURIZED (MISCELLANEOUS) IMPLANT
TRANSDUCER W/STOPCOCK (MISCELLANEOUS) ×2 IMPLANT
TUBING ART PRESS 72  MALE/FEM (TUBING) ×1
TUBING ART PRESS 72 MALE/FEM (TUBING) IMPLANT

## 2019-04-23 NOTE — Interval H&P Note (Signed)
History and Physical Interval Note:  04/23/2019 12:18 PM  Patricia Mata  has presented today for surgery, with the diagnosis of PAH.  The various methods of treatment have been discussed with the patient and family. After consideration of risks, benefits and other options for treatment, the patient has consented to  Procedure(s): RIGHT HEART CATH (N/A) as a surgical intervention.  The patient's history has been reviewed, patient examined, no change in status, stable for surgery.  I have reviewed the patient's chart and labs.  Questions were answered to the patient's satisfaction.     Patricia Mata

## 2019-04-23 NOTE — Discharge Instructions (Signed)
°  This sheet gives you information about how to care for yourself after your procedure. Your health care provider may also give you more specific instructions. If you have problems or questions, contact your health care provider. What can I expect after the procedure? After the procedure, it is common to have:  Bruising or mild discomfort in the area where the IV was inserted (insertion site). Follow these instructions at home: Eating and drinking   Follow instructions from your health care provider about eating or drinking restrictions.  General instructions  Check your IV insertion area every day for signs of infection. Check for: ? Redness, swelling, or pain. ? Fluid or blood. ? Warmth. ? Pus or a bad smell.  Take over-the-counter and prescription medicines only as told by your health care provider.  Rest and return to your normal activities as told by your health care provider. Ask your health care provider what activities are safe for you.  Do not drive for 24 hours if you were given a medicine to help you relax (sedative), or until your health care provider approves.  Keep all follow-up visits as told by your health care provider. This is important. Contact a health care provider if:  Your skin becomes itchy or you develop a rash or hives.  You have a fever that does not get better with medicine.  You feel nauseous.  You vomit.  You have redness, swelling, or pain around the insertion site.  You have fluid or blood coming from the insertion site.  Your insertion area feels warm to the touch.  You have pus or a bad smell coming from the insertion site. Get help right away if:  You have difficulty breathing or shortness of breath.  You develop chest pain.  You faint.  You feel very dizzy. These symptoms may represent a serious problem that is an emergency. Do not wait to see if the symptoms will go away. Get medical help right away. Call your local emergency  services (911 in the [place] Do not drive yourself to the hospital. Summary  After your procedure, it is common to have bruising or mild discomfort in the area where the IV was inserted.  You should check your IV insertion area every day for signs of infection.  Take over-the-counter and prescription medicines only as told by your health care provider.  You should drink a lot of fluids for the first several days after the procedure to help flush the contrast from your body. This information is not intended to replace advice given to you by your health care provider. Make sure you discuss any questions you have with your health care provider. Document Released: 04/21/2013 Document Revised: 06/13/2017 Document Reviewed: 05/25/2016 Elsevier Patient Education  2020 [employer].

## 2019-04-23 NOTE — Progress Notes (Signed)
Discharge instructions reviewed with pt and her husband both voice understanding.  

## 2019-04-26 ENCOUNTER — Encounter (HOSPITAL_COMMUNITY): Payer: Self-pay | Admitting: Internal Medicine

## 2019-04-26 ENCOUNTER — Other Ambulatory Visit: Payer: Self-pay | Admitting: Nurse Practitioner

## 2019-04-26 MED ORDER — LEVOTHYROXINE SODIUM 88 MCG PO TABS: 88.0000 ug | ORAL_TABLET | Freq: Every day | ORAL | 2 refills | Status: DC

## 2019-04-26 NOTE — Progress Notes (Signed)
And she is to return in 4 weeks for repeat thyroid studies

## 2019-04-26 NOTE — Progress Notes (Signed)
We will increase her levothyroxine to 88 mcg daily.

## 2019-04-29 ENCOUNTER — Other Ambulatory Visit: Payer: Self-pay | Admitting: Nurse Practitioner

## 2019-05-03 ENCOUNTER — Encounter: Payer: Self-pay | Admitting: Podiatry

## 2019-05-03 ENCOUNTER — Ambulatory Visit: Payer: BC Managed Care – PPO | Admitting: Podiatry

## 2019-05-03 ENCOUNTER — Other Ambulatory Visit: Payer: Self-pay

## 2019-05-03 DIAGNOSIS — E1122 Type 2 diabetes mellitus with diabetic chronic kidney disease: Secondary | ICD-10-CM

## 2019-05-03 DIAGNOSIS — B351 Tinea unguium: Secondary | ICD-10-CM | POA: Diagnosis not present

## 2019-05-03 DIAGNOSIS — M79675 Pain in left toe(s): Secondary | ICD-10-CM

## 2019-05-03 DIAGNOSIS — M79674 Pain in right toe(s): Secondary | ICD-10-CM | POA: Diagnosis not present

## 2019-05-03 DIAGNOSIS — L600 Ingrowing nail: Secondary | ICD-10-CM

## 2019-05-03 DIAGNOSIS — Z794 Long term (current) use of insulin: Secondary | ICD-10-CM

## 2019-05-03 DIAGNOSIS — N181 Chronic kidney disease, stage 1: Secondary | ICD-10-CM

## 2019-05-03 NOTE — Patient Instructions (Addendum)
EPSOM SALT FOOT SOAK INSTRUCTIONS  1.  Place 1/4 cup of epsom salts in 2 quarts of warm tap water. IF YOU ARE DIABETIC, OR HAVE NEUROPATHY,  CHECK THE TEMPERATURE OF THE WATER WITH YOUR ELBOW.  2.  Submerge your foot/feet in the solution and soak for 20 minutes.      3.  Next, remove your foot or feet from solution, blot dry the affected area.    4.  Apply antibiotic ointment and cover with fabric band-aid .  5.  This soak should be done once a day for 10 days.   6.  Monitor for any signs/symptoms of infection such as redness, swelling, odor, drainage, increased pain, or non-healing of digit.   7.  Please do not hesitate to call the office and speak to a Nurse or Doctor if you have questions.   8.  If you experience fever, chills, nightsweats, nausea or vomiting with worsening of digit, please go to the emergency room.    Diabetes Mellitus and Foot Care Foot care is an important part of your health, especially when you have diabetes. Diabetes may cause you to have problems because of poor blood flow (circulation) to your feet and legs, which can cause your skin to:  Become thinner and drier.  Break more easily.  Heal more slowly.  Peel and crack. You may also have nerve damage (neuropathy) in your legs and feet, causing decreased feeling in them. This means that you may not notice minor injuries to your feet that could lead to more serious problems. Noticing and addressing any potential problems early is the best way to prevent future foot problems. How to care for your feet Foot hygiene  Wash your feet daily with warm water and mild soap. Do not use hot water. Then, pat your feet and the areas between your toes until they are completely dry. Do not soak your feet as this can dry your skin.  Trim your toenails straight across. Do not dig under them or around the cuticle. File the edges of your nails with an emery board or nail file.  Apply a moisturizing lotion or petroleum jelly  to the skin on your feet and to dry, brittle toenails. Use lotion that does not contain alcohol and is unscented. Do not apply lotion between your toes. Shoes and socks  Wear clean socks or stockings every day. Make sure they are not too tight. Do not wear knee-high stockings since they may decrease blood flow to your legs.  Wear shoes that fit properly and have enough cushioning. Always look in your shoes before you put them on to be sure there are no objects inside.  To break in new shoes, wear them for just a few hours a day. This prevents injuries on your feet. Wounds, scrapes, corns, and calluses  Check your feet daily for blisters, cuts, bruises, sores, and redness. If you cannot see the bottom of your feet, use a mirror or ask someone for help.  Do not cut corns or calluses or try to remove them with medicine.  If you find a minor scrape, cut, or break in the skin on your feet, keep it and the skin around it clean and dry. You may clean these areas with mild soap and water. Do not clean the area with peroxide, alcohol, or iodine.  If you have a wound, scrape, corn, or callus on your foot, look at it several times a day to make sure it is healing and  not infected. Check for: ? Redness, swelling, or pain. ? Fluid or blood. ? Warmth. ? Pus or a bad smell. General instructions  Do not cross your legs. This may decrease blood flow to your feet.  Do not use heating pads or hot water bottles on your feet. They may burn your skin. If you have lost feeling in your feet or legs, you may not know this is happening until it is too late.  Protect your feet from hot and cold by wearing shoes, such as at the beach or on hot pavement.  Schedule a complete foot exam at least once a year (annually) or more often if you have foot problems. If you have foot problems, report any cuts, sores, or bruises to your health care provider immediately. Contact a health care provider if:  You have a medical  condition that increases your risk of infection and you have any cuts, sores, or bruises on your feet.  You have an injury that is not healing.  You have redness on your legs or feet.  You feel burning or tingling in your legs or feet.  You have pain or cramps in your legs and feet.  Your legs or feet are numb.  Your feet always feel cold.  You have pain around a toenail. Get help right away if:  You have a wound, scrape, corn, or callus on your foot and: ? You have pain, swelling, or redness that gets worse. ? You have fluid or blood coming from the wound, scrape, corn, or callus. ? Your wound, scrape, corn, or callus feels warm to the touch. ? You have pus or a bad smell coming from the wound, scrape, corn, or callus. ? You have a fever. ? You have a red line going up your leg. Summary  Check your feet every day for cuts, sores, red spots, swelling, and blisters.  Moisturize feet and legs daily.  Wear shoes that fit properly and have enough cushioning.  If you have foot problems, report any cuts, sores, or bruises to your health care provider immediately.  Schedule a complete foot exam at least once a year (annually) or more often if you have foot problems. This information is not intended to replace advice given to you by your health care provider. Make sure you discuss any questions you have with your health care provider. Document Released: 06/28/2000 Document Revised: 08/13/2017 Document Reviewed: 08/02/2016 Elsevier Patient Education  2020 [employer].

## 2019-05-04 ENCOUNTER — Telehealth: Payer: Self-pay

## 2019-05-04 ENCOUNTER — Other Ambulatory Visit: Payer: BC Managed Care – PPO

## 2019-05-04 NOTE — Telephone Encounter (Signed)
Per [facility] Referred to in lab study after 2 failed attempts of HST

## 2019-05-05 ENCOUNTER — Other Ambulatory Visit: Payer: Self-pay | Admitting: Nurse Practitioner

## 2019-05-06 ENCOUNTER — Inpatient Hospital Stay: Payer: BC Managed Care – PPO

## 2019-05-06 ENCOUNTER — Inpatient Hospital Stay: Payer: BC Managed Care – PPO | Attending: Oncology

## 2019-05-06 ENCOUNTER — Other Ambulatory Visit: Payer: Self-pay

## 2019-05-06 DIAGNOSIS — N189 Chronic kidney disease, unspecified: Secondary | ICD-10-CM

## 2019-05-06 DIAGNOSIS — D631 Anemia in chronic kidney disease: Secondary | ICD-10-CM

## 2019-05-06 DIAGNOSIS — D649 Anemia, unspecified: Secondary | ICD-10-CM | POA: Insufficient documentation

## 2019-05-06 LAB — CBC WITH DIFFERENTIAL (CANCER CENTER ONLY)
Abs Immature Granulocytes: 0.04 10*3/uL (ref 0.00–0.07)
Basophils Absolute: 0 10*3/uL (ref 0.0–0.1)
Basophils Relative: 0 %
Eosinophils Absolute: 0.1 10*3/uL (ref 0.0–0.5)
Eosinophils Relative: 1 %
HCT: 20.9 % — ABNORMAL LOW (ref 36.0–46.0)
Hemoglobin: 6.8 g/dL — CL (ref 12.0–15.0)
Immature Granulocytes: 1 %
Lymphocytes Relative: 12 %
Lymphs Abs: 0.8 10*3/uL (ref 0.7–4.0)
MCH: 30.9 pg (ref 26.0–34.0)
MCHC: 32.5 g/dL (ref 30.0–36.0)
MCV: 95 fL (ref 80.0–100.0)
Monocytes Absolute: 1.1 10*3/uL — ABNORMAL HIGH (ref 0.1–1.0)
Monocytes Relative: 18 %
Neutro Abs: 4.4 10*3/uL (ref 1.7–7.7)
Neutrophils Relative %: 68 %
Platelet Count: 154 10*3/uL (ref 150–400)
RBC: 2.2 MIL/uL — ABNORMAL LOW (ref 3.87–5.11)
RDW: 17.6 % — ABNORMAL HIGH (ref 11.5–15.5)
WBC Count: 6.4 10*3/uL (ref 4.0–10.5)
nRBC: 0.5 % — ABNORMAL HIGH (ref 0.0–0.2)

## 2019-05-06 LAB — PREPARE RBC (CROSSMATCH)

## 2019-05-06 MED ORDER — ACETAMINOPHEN 325 MG PO TABS
650.0000 mg | ORAL_TABLET | Freq: Once | ORAL | Status: AC
  Administered 2019-05-06: 650 mg via ORAL

## 2019-05-06 MED ORDER — DIPHENHYDRAMINE HCL 25 MG PO CAPS
25.0000 mg | ORAL_CAPSULE | Freq: Once | ORAL | Status: AC
  Administered 2019-05-06: 25 mg via ORAL

## 2019-05-06 MED ORDER — ACETAMINOPHEN 325 MG PO TABS
ORAL_TABLET | ORAL | Status: AC
  Filled 2019-05-06: qty 2

## 2019-05-06 MED ORDER — SODIUM CHLORIDE 0.9% IV SOLUTION
250.0000 mL | Freq: Once | INTRAVENOUS | Status: AC
  Administered 2019-05-06: 250 mL via INTRAVENOUS
  Filled 2019-05-06: qty 250

## 2019-05-06 MED ORDER — DIPHENHYDRAMINE HCL 25 MG PO CAPS
ORAL_CAPSULE | ORAL | Status: AC
  Filled 2019-05-06: qty 1

## 2019-05-06 NOTE — Progress Notes (Signed)
Subjective: Patricia Mata is seen today for follow up painful, elongated, thickened toenails 1-5 b/l feet that she cannot cut. Pain interferes with daily activities. Aggravating factor includes wearing enclosed shoe gear and relieved with periodic debridement.  Patient states her left great toe is painful today. She denies any redness or drainage from digit. Pain is aggravated when wearing shoe gear. She has done nothing to treat it.  Current Outpatient Medications on File Prior to Visit  Medication Sig  . amiodarone (PACERONE) 200 MG tablet Take 200 mg by mouth daily.  Marland Kitchen amLODipine (NORVASC) 10 MG tablet Take 1 tablet (10 mg total) by mouth daily.  Marland Kitchen atorvastatin (LIPITOR) 40 MG tablet TAKE 1 TABLET BY MOUTH EVERY DAY  . blood glucose meter kit and supplies Dispense based on patient and insurance preference. Use up to four times daily as directed. (FOR ICD-10 E10.9, E11.9).  . colchicine 0.6 MG tablet Take 0.6 mg by mouth 2 (two) times daily as needed (for gout).  . furosemide (LASIX) 40 MG tablet Take 1 tablet (40 mg total) by mouth daily.  Marland Kitchen glucose blood test strip Use as instructed  . hydrALAZINE (APRESOLINE) 100 MG tablet Take 1 tablet (100 mg total) by mouth 3 (three) times daily.  . insulin glargine (LANTUS) 100 unit/mL SOPN Inject 0.15 mLs (15 Units total) into the skin daily. (Patient taking differently: Inject 15 Units into the skin daily as needed (blood sugar over 150). )  . Insulin Pen Needle 29G X 5MM MISC Use as directed  . latanoprost (XALATAN) 0.005 % ophthalmic solution Place 1 drop into both eyes at bedtime.   Marland Kitchen levothyroxine (SYNTHROID) 88 MCG tablet Take 1 tablet (88 mcg total) by mouth daily.  . metoprolol succinate (TOPROL-XL) 25 MG 24 hr tablet Take 1 tablet (25 mg total) by mouth daily. STOP taking this unless heart rate >100bpm OR directed by your doctor  . Multiple Vitamins-Minerals (CENTRUM SILVER 50+WOMEN) TABS Take 1 tablet by mouth daily.  . sildenafil (REVATIO) 20  MG tablet Take 1 tablet (20 mg total) by mouth 3 (three) times daily.  . sitaGLIPtin (JANUVIA) 50 MG tablet Take 1 tablet (50 mg total) by mouth daily.   Current Facility-Administered Medications on File Prior to Visit  Medication  . sodium chloride flush (NS) 0.9 % injection 10 mL     Allergies  Allergen Reactions  . Ancef [Cefazolin] Itching    Severe itching- after procedure, ancef was the antibiotic.-04/02/17  . Sulfa Antibiotics Rash and Other (See Comments)    Blisters, also   Objective:  Vascular Examination: Capillary refill time immediate x 10 digits.  Dorsalis pedis present b/l.  Posterior tibial pulses present b/l.  Digital hair sparse b/l.  Skin temperature gradient WNL b/l.   Dermatological Examination: Skin with normal turgor, texture and tone b/l  Toenails 1-5 b/l discolored, thick, dystrophic with subungual debris and pain with palpation to nailbeds due to thickness of nails.  Subacute ingrown toenail left hallux lateral border with dried serosanguinous drainage. No erythema, no edema, no flocculence.  Raised soft tissue mass 1st webspace. No pain on palpation. Confirmed as gouty tophi per MRI.  Musculoskeletal: Muscle strength 5/5 to all LE muscle groups.  Mild bunion deformity.  No pain, crepitus or joint limitation noted with ROM.   Neurological Examination: Protective sensation intact with 10 gram monofilament bilaterally.  Epicritic sensation present bilaterally.  Vibratory sensation intact bilaterally.   Assessment: Painful onychomycosis toenails 1-5 b/l  Subacute ingrown toenail left hallux lateral  border  Plan: 1. Toenails 1-5 b/l were debrided in length and girth without iatrogenic bleeding.Offending nail border debrided and curretaged left hallux. Border cleansed with alcohol and tripe antibiotic applied. Patient given written instructions for epsom salt soaks once daily for one week. Call office if condition does not  resolve. 2. Patient to continue soft, supportive shoe gear 3. Patient to report any pedal injuries to medical professional immediately. 4. Follow up 3 months.  5. Patient/POA to call should there be a concern in the interim.

## 2019-05-06 NOTE — Patient Instructions (Signed)

## 2019-05-07 LAB — TYPE AND SCREEN
ABO/RH(D): A NEG
Antibody Screen: NEGATIVE
Unit division: 0

## 2019-05-07 LAB — BPAM RBC
Blood Product Expiration Date: 202011092359
ISSUE DATE / TIME: 202010221425
Unit Type and Rh: 600

## 2019-05-12 ENCOUNTER — Other Ambulatory Visit: Payer: Self-pay | Admitting: Nurse Practitioner

## 2019-05-17 ENCOUNTER — Other Ambulatory Visit: Payer: Self-pay | Admitting: Internal Medicine

## 2019-05-17 ENCOUNTER — Other Ambulatory Visit: Payer: Self-pay | Admitting: Nurse Practitioner

## 2019-05-21 ENCOUNTER — Other Ambulatory Visit: Payer: Self-pay

## 2019-05-21 ENCOUNTER — Encounter (HOSPITAL_COMMUNITY): Payer: Self-pay | Admitting: Internal Medicine

## 2019-05-21 ENCOUNTER — Ambulatory Visit (HOSPITAL_COMMUNITY)
Admission: RE | Admit: 2019-05-21 | Discharge: 2019-05-21 | Disposition: A | Discharge status: Home / Self Care | Payer: BC Managed Care – PPO | Source: Ambulatory Visit | Attending: Internal Medicine | Admitting: Internal Medicine

## 2019-05-21 VITALS — BP 158/50 | HR 64 | Wt 180.0 lb

## 2019-05-21 DIAGNOSIS — Z8042 Family history of malignant neoplasm of prostate: Secondary | ICD-10-CM | POA: Insufficient documentation

## 2019-05-21 DIAGNOSIS — Z833 Family history of diabetes mellitus: Secondary | ICD-10-CM | POA: Insufficient documentation

## 2019-05-21 DIAGNOSIS — Z6830 Body mass index (BMI) 30.0-30.9, adult: Secondary | ICD-10-CM | POA: Insufficient documentation

## 2019-05-21 DIAGNOSIS — Z85038 Personal history of other malignant neoplasm of large intestine: Secondary | ICD-10-CM | POA: Insufficient documentation

## 2019-05-21 DIAGNOSIS — R059 Cough, unspecified: Secondary | ICD-10-CM

## 2019-05-21 DIAGNOSIS — I272 Pulmonary hypertension, unspecified: Secondary | ICD-10-CM

## 2019-05-21 DIAGNOSIS — Z882 Allergy status to sulfonamides status: Secondary | ICD-10-CM | POA: Insufficient documentation

## 2019-05-21 DIAGNOSIS — I5032 Chronic diastolic (congestive) heart failure: Secondary | ICD-10-CM

## 2019-05-21 DIAGNOSIS — I132 Hypertensive heart and chronic kidney disease with heart failure and with stage 5 chronic kidney disease, or end stage renal disease: Secondary | ICD-10-CM | POA: Diagnosis not present

## 2019-05-21 DIAGNOSIS — Z87891 Personal history of nicotine dependence: Secondary | ICD-10-CM | POA: Insufficient documentation

## 2019-05-21 DIAGNOSIS — M109 Gout, unspecified: Secondary | ICD-10-CM | POA: Diagnosis not present

## 2019-05-21 DIAGNOSIS — Z794 Long term (current) use of insulin: Secondary | ICD-10-CM | POA: Diagnosis not present

## 2019-05-21 DIAGNOSIS — M4628 Osteomyelitis of vertebra, sacral and sacrococcygeal region: Secondary | ICD-10-CM | POA: Diagnosis not present

## 2019-05-21 DIAGNOSIS — R918 Other nonspecific abnormal finding of lung field: Secondary | ICD-10-CM | POA: Diagnosis not present

## 2019-05-21 DIAGNOSIS — Z881 Allergy status to other antibiotic agents status: Secondary | ICD-10-CM | POA: Insufficient documentation

## 2019-05-21 DIAGNOSIS — I350 Nonrheumatic aortic (valve) stenosis: Secondary | ICD-10-CM | POA: Diagnosis not present

## 2019-05-21 DIAGNOSIS — Z8049 Family history of malignant neoplasm of other genital organs: Secondary | ICD-10-CM | POA: Insufficient documentation

## 2019-05-21 DIAGNOSIS — R05 Cough: Secondary | ICD-10-CM

## 2019-05-21 DIAGNOSIS — Z79890 Hormone replacement therapy (postmenopausal): Secondary | ICD-10-CM | POA: Insufficient documentation

## 2019-05-21 DIAGNOSIS — E1122 Type 2 diabetes mellitus with diabetic chronic kidney disease: Secondary | ICD-10-CM | POA: Insufficient documentation

## 2019-05-21 DIAGNOSIS — N184 Chronic kidney disease, stage 4 (severe): Secondary | ICD-10-CM | POA: Diagnosis not present

## 2019-05-21 DIAGNOSIS — Z79899 Other long term (current) drug therapy: Secondary | ICD-10-CM | POA: Diagnosis not present

## 2019-05-21 DIAGNOSIS — G35 Multiple sclerosis: Secondary | ICD-10-CM | POA: Diagnosis not present

## 2019-05-21 DIAGNOSIS — Z8249 Family history of ischemic heart disease and other diseases of the circulatory system: Secondary | ICD-10-CM | POA: Diagnosis not present

## 2019-05-21 DIAGNOSIS — I480 Paroxysmal atrial fibrillation: Secondary | ICD-10-CM | POA: Diagnosis not present

## 2019-05-21 DIAGNOSIS — D469 Myelodysplastic syndrome, unspecified: Secondary | ICD-10-CM | POA: Insufficient documentation

## 2019-05-21 DIAGNOSIS — Z9049 Acquired absence of other specified parts of digestive tract: Secondary | ICD-10-CM | POA: Insufficient documentation

## 2019-05-21 MED ORDER — DOXAZOSIN MESYLATE 1 MG PO TABS: 1.0000 mg | ORAL_TABLET | Freq: Every day | ORAL | 6 refills | Status: DC

## 2019-05-21 MED ORDER — DOXYCYCLINE MONOHYDRATE 100 MG PO TABS: 100.0000 mg | ORAL_TABLET | Freq: Two times a day (BID) | ORAL | 0 refills | Status: AC

## 2019-05-21 NOTE — Patient Instructions (Signed)
Start Doxazosin 1 mg daily AT BEDTIME  Start Doxycycline 100 mg Twice daily FOR 7 DAYS ONLY  Chest X-ray today  You have been referred to Pulmonary Rehab, they will call you to schedule this  Your physician recommends that you schedule a follow-up appointment in: 4 months  If you have any questions or concerns before your next appointment please send [place] a message through Central or call our office at [phone].  At the [facility], you and your health needs are our priority. As part of our continuing mission to provide you with exceptional heart care, we have created designated Provider Care Teams. These Care Teams include your primary Cardiologist (physician) and Advanced Practice Providers (APPs- Physician Assistants and Nurse Practitioners) who all work together to provide you with the care you need, when you need it.   You may see any of the following providers on your designated Care Team at your next follow up: Marland Kitchen Dr Glori Bickers . Dr Loralie Champagne . Darrick Grinder, NP . Lyda Jester, PA   Please be sure to bring in all your medications bottles to every appointment.

## 2019-05-21 NOTE — Progress Notes (Addendum)
Advanced Heart Failure Clinic Note   Referring Physician: PCP: [name], FNP PCP-Cardiologist: Quay Burow, MD  Hematologist: Dr. Alen Blew  HPI:  Patricia Mata is a 63 y.o. 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 Followed by Dr Gwenlyn Found for severe AS and pulmonary HTN.  Admitted on 05/10/18  with AF with RVR and recurrent anemia. Developed respiratory failure and intubated. She was transfused and placed on IV amio. Subsequently developed AKI/ESRD and placed on CVVHD. Converted to NSR on amio and weaned off the vent.  Echoshowed normal LVEF 65-70% with moderate AS and moderate MS and severe PH with RVSP 63mHG. She is not a candidate for intervention of AS or MS with advanced PAH and transfusion-dependent myelodysplasia as a result of her colon CA  [facility] normal [facility] and severely elevated PA pressures.-> V/Q scan was negative. She was started on sildenafil and tolerated well. She diuresed with IV lasix and transitioned to lasix 40 mg PO BID. She did not require O2 at discharge. Palliative care was consulted and decision was made to transition to DNR.  She follows with Dr. SAlen Blewand requires transfusions every 2-3 weeks as an outpatient. She also developed a buttock wound. WOC was consulted and recommended hydrotherapy for coccygeal osteo   [facility]   Recent RHC 04/23/19 showed mild PAH in setting of high-output (likely due to anemia) RA = 8 RV = 53/3 PA = 57/12 (32) PCW = 14 (v waves to 32) Fick cardiac output/index = 8.4/4.5 PVR = 2.1 WU Ao sat = 99% PA sat = 73%, 74% High SVC sat = 74%   Here for routine f/u. Feels fine. Still with dry, hacking cough which has been going on for a long time. Denies fevers or chills. Says it is a thick yellow cough.  Had it before covid. CXR 03/18/19 with LUL opacity. No edema, orthopnea or PND. No dizziness. Hgb back down to 6.8 on 05/06/19. Got 2u RBCs.     Cardiac studies:  RHC 11/19  RA  14 RV  110/15 PA 105/40 (62) PCWP 25 Ao sat 91% PA sat 53% Fick CO/CI  6.8/4.3  PVR 5.4  AoV  Mean 248mG AVA 1.3 cm2  PFTs 8/20 FEV1 2.24 (112%) FVC 2.63 (103%) DLCO 56%  Echo 02/15/19 EF 65% RV normal size/function. Septal flattening. Severe AS (mean 4362m) RVSP 69m75m     Review of systems complete and found to be negative unless listed in HPI.    Past Medical History:  Diagnosis Date  . Acute kidney injury (nontraumatic) (HCC)[place]. Acute renal failure (ARF) (HCC)[place]/25/2019  . [facility]   [facility]   . Diabetes mellitus without complication (HCC)[place]. Gout 09/08/2018  . Hypertension   . Macrocytic anemia     Current Outpatient Medications  Medication Sig Dispense Refill  . allopurinol (ZYLOPRIM) 100 MG tablet Take 100 mg by mouth daily.    . amMarland Kitchenodarone (PACERONE) 200 MG tablet Take 200 mg by mouth daily.    . amMarland KitchenODipine (NORVASC) 10 MG tablet Take 1 tablet (10 mg total) by mouth daily. 90 tablet 3  . atorvastatin (LIPITOR) 40 MG tablet TAKE 1 TABLET BY MOUTH EVERY DAY 90 tablet 0  . blood glucose meter kit and supplies Dispense based on patient and insurance preference. Use up to four  times daily as directed. (FOR ICD-10 E10.9, E11.9). 1 each 3  . colchicine 0.6 MG tablet Take 0.6 mg by mouth 2 (two) times daily as needed (for gout).    . furosemide (LASIX) 40 MG tablet TAKE 1 TABLET BY MOUTH EVERY DAY 90 tablet 1  . gabapentin (NEURONTIN) 300 MG capsule     . glucose blood test strip Use as instructed 100 each 12  . hydrALAZINE (APRESOLINE) 100 MG tablet Take 1 tablet (100 mg total) by mouth 3 (three) times daily. 180 tablet 5  . insulin glargine (LANTUS) 100 unit/mL SOPN Inject 0.15 mLs (15 Units total) into the skin daily. (Patient taking differently: Inject 15 Units into the skin  daily as needed (blood sugar over 150). )    . Insulin Pen Needle 29G X 5MM MISC Use as directed 200 each 0  . latanoprost (XALATAN) 0.005 % ophthalmic solution Place 1 drop into both eyes at bedtime.     Marland Kitchen levothyroxine (SYNTHROID) 88 MCG tablet Take 1 tablet (88 mcg total) by mouth daily. 30 tablet 2  . metoprolol succinate (TOPROL-XL) 25 MG 24 hr tablet Take 1 tablet (25 mg total) by mouth daily. STOP taking this unless heart rate >100bpm OR directed by your doctor 30 tablet 5  . Multiple Vitamins-Minerals (CENTRUM SILVER 50+WOMEN) TABS Take 1 tablet by mouth daily.    . sildenafil (REVATIO) 20 MG tablet Take 1 tablet (20 mg total) by mouth 3 (three) times daily. 270 tablet 3  . sitaGLIPtin (JANUVIA) 50 MG tablet Take 1 tablet (50 mg total) by mouth daily. 90 tablet 0   No current facility-administered medications for this encounter.    Facility-Administered Medications Ordered in Other Encounters  Medication Dose Route Frequency Provider Last Rate Last Dose  . sodium chloride flush (NS) 0.9 % injection 10 mL  10 mL Intravenous PRN [name], MD        Allergies  Allergen Reactions  . Ancef [Cefazolin] Itching    Severe itching- after procedure, ancef was the antibiotic.-04/02/17  . Sulfa Antibiotics Rash and Other (See Comments)    Blisters, also      Social History   Socioeconomic History  . Marital status: Married    Spouse name: Not on file  . Number of children: Not on file  . Years of education: Not on file  . Highest education level: Not on file  Occupational History  . Not on file  Social Needs  . Financial resource strain: Not on file  . Food insecurity    Worry: Not on file    Inability: Not on file  . Transportation needs    Medical: Not on file    Non-medical: Not on file  Tobacco Use  . Smoking status: Former Smoker    Packs/day: 0.25    Years: 20.00    Pack years: 5.00    Types: Cigarettes    Quit date: 01/31/2001    Years since quitting: 18.3   . Smokeless tobacco: Former Network engineer and Sexual Activity  . Alcohol use: Yes    Alcohol/week: 2.0 standard drinks    Types: 2 Shots of liquor per week  . Drug use: Never  . Sexual activity: Not on file  Lifestyle  . Physical activity    Days per week: Not on file    Minutes per session: Not on file  . Stress: Not on file  Relationships  . Social connections    Talks on phone: Not on  file    Gets together: Not on file    Attends religious service: Not on file    Active member of club or organization: Not on file    Attends meetings of clubs or organizations: Not on file    Relationship status: Not on file  . Intimate partner violence    Fear of current or ex partner: Not on file    Emotionally abused: Not on file    Physically abused: Not on file    Forced sexual activity: Not on file  Other Topics Concern  . Not on file  Social History Narrative  . Not on file      Family History  Problem Relation Age of Onset  . Hypertension Mother   . Diabetes Mother   . Cervical cancer Mother   . Heart attack Father   . Hypertension Sister   . Hypertension Brother   . Hypertension Sister   . Hypertension Sister   . Prostate cancer Brother   . HIV/AIDS Brother    Vitals:   05/21/19 1013  BP: (!) 158/50  Pulse: 64  SpO2: 100%  Weight: 81.6 kg (180 lb)    Wt Readings from Last 3 Encounters:  05/21/19 81.6 kg (180 lb)  04/23/19 80.7 kg (178 lb)  04/20/19 81.7 kg (180 lb 3.2 oz)    PHYSICAL EXAM: General:  Well appearing. No resp difficulty HEENT: normal Neck: supple. no JVD. Carotids 2+ bilat; + bruits. No lymphadenopathy or thryomegaly appreciated. Cor: PMI nondisplaced. Regular rate & rhythm. 3/6 AS s2 ok Lungs: clear Abdomen: soft, nontender, nondistended. No hepatosplenomegaly. No bruits or masses. Good bowel sounds. Extremities: no cyanosis, clubbing, rash, edema Neuro: alert & orientedx3, cranial nerves grossly intact. moves all 4 extremities w/o  difficulty. Affect pleasant   ASSESSMENT & PLAN:  1. PAH - Echo 05/12/18 LVEF 65-70%, Grade 2 DD, Mod AS, Mild/Mod MS, Mod TR, Trivial PI, PA peak pressure 93.  - L/RHC 05/21/18 with no significant CAD, severe pulmonary HTN with PA 105/40 (62) PCW 33 PVR 5.4 WU - Suspect multifactorial in nature suspect including high output due to anemia, WHO group II (diastolic HF and valvular disease) and III (OSA) but given degree of PH cannot exclude WHO Group I component. - Repeat RHC 04/23/19 with much improved pulmonary pressures. PA 57/12 (32) PCWP 14 (v to 32) - Overall stable NYHA III - Echo 8/20 EF 65% with normal RV size and function. + septal flattening. RVSP 31mHG - VQ negative - PFTs 8/20 with normal spirometry and moderately reduced DLCO - Sleep study done. Pending results - Continue sildenafil 20 mg TID cautiously.  - Volume status stable today - Continue lasix 40 mg daily. - Refer Pulmonary Rehab  2. Moderate AS/MS - Echo 8/20 with severe AS but on exam sounds more moderate c/w previous cath.  - She has seen Dr. SAlen Blewand he has made it clear that she has a terminal illness but he would have no issue with using Plavix if needed - That said I do not believe AS is her primary issue here and with CKD 4 and severe PAH likely not TAVR candidate  3. PAF - Remains in NSR on amio. Did not tolerate AF well at all. - Continue amiodarone 200 daily - Continue Toprol XL 25 mg daily (was on BID but cut back due to bradycardia)  - Continue to avoid anticoagulation with transfusion dependent anemia. (chronic).  - CHA2DS2/VASc is at least 4.   4. CKD 4 -  Cr running 2.2-2.7  - was 2.5 on 04/20/19  5. 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   6. HTN - Markedly elevated - Start doxazosin 51m qhs - Continue hydral 100 mg TID.  - Continue amlodipine to 10 mg daily.  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  - Resolved  8. Productive cough with LUL opacity - start doxy 100 bid x 7 days - repeat CXR today - if persists will refer to Pulmonary   DGlori Bickers MD 05/21/19

## 2019-05-21 NOTE — Addendum Note (Signed)
Encounter addended by: Scarlette Calico, RN on: 05/21/2019 10:50 AM  Actions taken: Pharmacy for encounter modified, Order list changed, Diagnosis association updated, Clinical Note Signed

## 2019-05-24 ENCOUNTER — Other Ambulatory Visit: Payer: Self-pay

## 2019-05-25 ENCOUNTER — Inpatient Hospital Stay: Payer: BC Managed Care – PPO

## 2019-05-25 ENCOUNTER — Other Ambulatory Visit: Payer: Self-pay

## 2019-05-25 ENCOUNTER — Inpatient Hospital Stay: Payer: BC Managed Care – PPO | Attending: Oncology | Admitting: Oncology

## 2019-05-25 VITALS — BP 162/44 | HR 80 | Temp 99.1°F | Resp 18 | Ht 64.0 in | Wt 180.5 lb

## 2019-05-25 DIAGNOSIS — D709 Neutropenia, unspecified: Secondary | ICD-10-CM | POA: Insufficient documentation

## 2019-05-25 DIAGNOSIS — N189 Chronic kidney disease, unspecified: Secondary | ICD-10-CM

## 2019-05-25 DIAGNOSIS — D469 Myelodysplastic syndrome, unspecified: Secondary | ICD-10-CM | POA: Diagnosis not present

## 2019-05-25 DIAGNOSIS — C189 Malignant neoplasm of colon, unspecified: Secondary | ICD-10-CM | POA: Diagnosis not present

## 2019-05-25 DIAGNOSIS — D649 Anemia, unspecified: Secondary | ICD-10-CM | POA: Diagnosis not present

## 2019-05-25 DIAGNOSIS — D631 Anemia in chronic kidney disease: Secondary | ICD-10-CM

## 2019-05-25 LAB — CBC WITH DIFFERENTIAL (CANCER CENTER ONLY)
Abs Immature Granulocytes: 0.03 10*3/uL (ref 0.00–0.07)
Basophils Absolute: 0 10*3/uL (ref 0.0–0.1)
Basophils Relative: 0 %
Eosinophils Absolute: 0.1 10*3/uL (ref 0.0–0.5)
Eosinophils Relative: 1 %
HCT: 21.6 % — ABNORMAL LOW (ref 36.0–46.0)
Hemoglobin: 7 g/dL — ABNORMAL LOW (ref 12.0–15.0)
Immature Granulocytes: 0 %
Lymphocytes Relative: 8 %
Lymphs Abs: 0.7 10*3/uL (ref 0.7–4.0)
MCH: 29.7 pg (ref 26.0–34.0)
MCHC: 32.4 g/dL (ref 30.0–36.0)
MCV: 91.5 fL (ref 80.0–100.0)
Monocytes Absolute: 1 10*3/uL (ref 0.1–1.0)
Monocytes Relative: 12 %
Neutro Abs: 6.5 10*3/uL (ref 1.7–7.7)
Neutrophils Relative %: 79 %
Platelet Count: 159 10*3/uL (ref 150–400)
RBC: 2.36 MIL/uL — ABNORMAL LOW (ref 3.87–5.11)
RDW: 20.9 % — ABNORMAL HIGH (ref 11.5–15.5)
WBC Count: 8.3 10*3/uL (ref 4.0–10.5)
nRBC: 0 % (ref 0.0–0.2)

## 2019-05-25 LAB — PREPARE RBC (CROSSMATCH)

## 2019-05-25 MED ORDER — ACETAMINOPHEN 325 MG PO TABS
650.0000 mg | ORAL_TABLET | Freq: Once | ORAL | Status: AC
  Administered 2019-05-25: 650 mg via ORAL

## 2019-05-25 MED ORDER — FUROSEMIDE 10 MG/ML IJ SOLN
INTRAMUSCULAR | Status: AC
  Filled 2019-05-25: qty 2

## 2019-05-25 MED ORDER — DIPHENHYDRAMINE HCL 25 MG PO CAPS
ORAL_CAPSULE | ORAL | Status: AC
  Filled 2019-05-25: qty 1

## 2019-05-25 MED ORDER — FUROSEMIDE 10 MG/ML IJ SOLN
20.0000 mg | Freq: Once | INTRAMUSCULAR | Status: AC
  Administered 2019-05-25: 20 mg via INTRAVENOUS

## 2019-05-25 MED ORDER — SODIUM CHLORIDE 0.9% IV SOLUTION
250.0000 mL | Freq: Once | INTRAVENOUS | Status: AC
  Administered 2019-05-25: 250 mL via INTRAVENOUS
  Filled 2019-05-25: qty 250

## 2019-05-25 MED ORDER — ACETAMINOPHEN 325 MG PO TABS
ORAL_TABLET | ORAL | Status: AC
  Filled 2019-05-25: qty 2

## 2019-05-25 MED ORDER — DIPHENHYDRAMINE HCL 25 MG PO CAPS
25.0000 mg | ORAL_CAPSULE | Freq: Once | ORAL | Status: AC
  Administered 2019-05-25: 25 mg via ORAL

## 2019-05-25 NOTE — Progress Notes (Signed)
Per Dr. Alen Blew, patient to receive Tylenol and Benadryl as premed for blood transfusion and Lasix after completion of blood transfusion. Infusion RN made aware.

## 2019-05-25 NOTE — Patient Instructions (Signed)

## 2019-05-25 NOTE — Progress Notes (Signed)
Hematology and Oncology Follow Up Visit  Patricia Mata [id number] September 08, 1955 63 y.o. 05/25/2019 10:13 AM   Principle Diagnosis: 63 year old woman with:   1.  Stage III colon cancer diagnosed in 2008.  She has no evidence of relapse and presumably cured at this time.  2.  Myelodysplastic syndrome (MDS) presented with anemia and IPSS-R score of 2.5 diagnosed in 2017.    Prior Therapy: She is S/P hemicolectomy done in 11/2006. 1/13 lymph nodes are positive.  This was followed by 12 cycles of FOLFOX completed in 05/2007  Aranesp 300 mcg every 2 to 3 weeks therapy was ineffective to eliminate transfusion needs.  5-azacytidine at 75 mg/m square subcutaneously started in October 2018.  She is status post 7 cycles of therapy completed in April 2019.   Current therapy: Transfusion for supportive care only.     Interim History:  Mrs. Patricia Mata is here for a follow-up.  Since the last visit, she reports no major changes in her health.  She continues to feel reasonably well without any decline in her energy or performance status.  She denies any chest pain or shortness of breath.  She denies any recent hospitalizations.  She continues to be active and attends to activities of daily living.   Patient denied headaches, blurry vision, syncope or seizures.  Denies any fevers, chills or sweats.  Denied chest pain, palpitation, orthopnea or leg edema.  Denied cough, wheezing or hemoptysis.  Denied nausea, vomiting or abdominal pain.  Denies any constipation or diarrhea.  Denies any frequency urgency or hesitancy.  Denies any arthralgias or myalgias.  Denies any skin rashes or lesions.  Denies any bleeding or clotting tendency.  Denies any easy bruising.  Denies any hair or nail changes.  Denies any anxiety or depression.  Remaining review of system is negative.     Current Outpatient Medications on File Prior to Visit  Medication Sig Dispense Refill  . allopurinol (ZYLOPRIM) 100 MG tablet Take 100 mg by  mouth daily.    Marland Kitchen amiodarone (PACERONE) 200 MG tablet Take 200 mg by mouth daily.    Marland Kitchen amLODipine (NORVASC) 10 MG tablet Take 1 tablet (10 mg total) by mouth daily. 90 tablet 3  . atorvastatin (LIPITOR) 40 MG tablet TAKE 1 TABLET BY MOUTH EVERY DAY 90 tablet 0  . blood glucose meter kit and supplies Dispense based on patient and insurance preference. Use up to four times daily as directed. (FOR ICD-10 E10.9, E11.9). 1 each 3  . colchicine 0.6 MG tablet Take 0.6 mg by mouth 2 (two) times daily as needed (for gout).    Marland Kitchen doxazosin (CARDURA) 1 MG tablet Take 1 tablet (1 mg total) by mouth at bedtime. 30 tablet 6  . doxycycline (ADOXA) 100 MG tablet Take 1 tablet (100 mg total) by mouth 2 (two) times daily for 7 days. 14 tablet 0  . furosemide (LASIX) 40 MG tablet TAKE 1 TABLET BY MOUTH EVERY DAY 90 tablet 1  . gabapentin (NEURONTIN) 300 MG capsule     . glucose blood test strip Use as instructed 100 each 12  . hydrALAZINE (APRESOLINE) 100 MG tablet Take 1 tablet (100 mg total) by mouth 3 (three) times daily. 180 tablet 5  . insulin glargine (LANTUS) 100 unit/mL SOPN Inject 0.15 mLs (15 Units total) into the skin daily. (Patient taking differently: Inject 15 Units into the skin daily as needed (blood sugar over 150). )    . Insulin Pen Needle 29G X 5MM MISC Use  as directed 200 each 0  . latanoprost (XALATAN) 0.005 % ophthalmic solution Place 1 drop into both eyes at bedtime.     Marland Kitchen levothyroxine (SYNTHROID) 88 MCG tablet Take 1 tablet (88 mcg total) by mouth daily. 30 tablet 2  . metoprolol succinate (TOPROL-XL) 25 MG 24 hr tablet Take 1 tablet (25 mg total) by mouth daily. STOP taking this unless heart rate >100bpm OR directed by your doctor 30 tablet 5  . Multiple Vitamins-Minerals (CENTRUM SILVER 50+WOMEN) TABS Take 1 tablet by mouth daily.    . sildenafil (REVATIO) 20 MG tablet Take 1 tablet (20 mg total) by mouth 3 (three) times daily. 270 tablet 3  . sitaGLIPtin (JANUVIA) 50 MG tablet Take 1  tablet (50 mg total) by mouth daily. 90 tablet 0      Family history, social history and surgical history updated without any changes.         Allergies:  Allergies  Allergen Reactions  . Ancef [Cefazolin] Itching    Severe itching- after procedure, ancef was the antibiotic.-04/02/17  . Sulfa Antibiotics Rash and Other (See Comments)    Blisters, also     Physical Exam:    Blood pressure (!) 162/44, pulse 80, temperature 99.1 F (37.3 C), temperature source Temporal, resp. rate 18, height '5\' 4"'  (1.626 m), weight 180 lb 8 oz (81.9 kg), SpO2 100 %.    ECOG: 2    General appearance: Alert, awake without any distress. Head: Atraumatic without abnormalities Oropharynx: Without any thrush or ulcers. Eyes: No scleral icterus. Lymph nodes: No lymphadenopathy noted in the cervical, supraclavicular, or axillary nodes Heart:regular rate and rhythm, without any murmurs or gallops.   Lung: Clear to auscultation without any rhonchi, wheezes or dullness to percussion. Abdomin: Soft, nontender without any shifting dullness or ascites. Musculoskeletal: No clubbing or cyanosis. Neurological: No motor or sensory deficits. Skin: No rashes or lesions. Psychiatric: Mood and affect appeared normal.             Lab Results: Lab Results  Component Value Date   WBC 6.4 05/06/2019   HGB 6.8 (LL) 05/06/2019   HCT 20.9 (L) 05/06/2019   MCV 95.0 05/06/2019   PLT 154 05/06/2019     Chemistry      Component Value Date/Time   NA 142 04/23/2019 1237   NA 138 04/19/2019 1639   NA 134 (L) 05/26/2017 0949   K 4.1 04/23/2019 1237   K 4.0 05/26/2017 0949   CL 102 04/20/2019 1155   CL 96 (L) 03/31/2012 1513   CO2 23 04/20/2019 1155   CO2 24 05/26/2017 0949   BUN 67 (H) 04/20/2019 1155   BUN 67 (H) 04/19/2019 1639   BUN 29.2 (H) 05/26/2017 0949   CREATININE 2.50 (H) 04/20/2019 1155   CREATININE 2.73 (H) 02/09/2019 1016   CREATININE 2.24 (H) 01/27/2019 1355   CREATININE  1.4 (H) 05/26/2017 0949      Component Value Date/Time   CALCIUM 9.8 04/20/2019 1155   CALCIUM 8.9 05/26/2017 0949   ALKPHOS 81 04/19/2019 1639   ALKPHOS 157 (H) 05/26/2017 0949   AST 23 04/19/2019 1639   AST 20 02/09/2019 1016   AST 29 05/26/2017 0949   ALT 12 04/19/2019 1639   ALT 8 02/09/2019 1016   ALT 23 05/26/2017 0949   BILITOT 0.7 04/19/2019 1639   BILITOT 0.5 02/09/2019 1016   BILITOT 2.19 (H) 05/26/2017 0949      Impression and Plan:  63 year old woman with:  1.  MDS diagnosed in 2017 and has progressed on multiple therapies outlined above.      She is currently receiving transfusion therapy without any major complications.  Alternative treatment options were discussed today including Reblozyl was reviewed.  Potential complication associated with this therapy was reiterated.  Complications such as hypertension GI complications, dizziness, arthralgias were reiterated.  The alternative options would be to continue with supportive transfusion only.  After discussion she is agreeable to proceed with Reblozyl  2.  Anemia: Her hemoglobin is 7 today and she would benefit from packed red cell transfusion.  3. Hypertension: Blood pressure mildly elevated today but has been close to normal between visits.   4.  Aortic stenosis: Continues to follow with cardiology at this time no recent complications.   5.  Prognosis: Therapy remains palliative although her performance status is reasonable and aggressive measures are warranted.  6. Follow-up: In 3 weeks for injection and potential transfusion.  She will have MD follow-up in 6 weeks.  25  minutes was spent with the patient face-to-face today.  More than 50% of time was spent on reviewing her disease status, discussing treatment options as well as future plan of care.  Had a upper abdomen a atypical and  Zola Button, MD 11/10/202010:13 AM

## 2019-05-26 LAB — BPAM RBC
Blood Product Expiration Date: 202012082359
ISSUE DATE / TIME: 202011101157
Unit Type and Rh: 600

## 2019-05-26 LAB — TYPE AND SCREEN
ABO/RH(D): A NEG
Antibody Screen: NEGATIVE
Unit division: 0

## 2019-05-27 ENCOUNTER — Other Ambulatory Visit: Payer: Self-pay

## 2019-06-03 ENCOUNTER — Other Ambulatory Visit (HOSPITAL_COMMUNITY): Payer: Self-pay | Admitting: Cardiovascular Disease

## 2019-06-03 ENCOUNTER — Other Ambulatory Visit: Payer: Self-pay

## 2019-06-03 ENCOUNTER — Ambulatory Visit (HOSPITAL_COMMUNITY)
Admission: RE | Admit: 2019-06-03 | Discharge: 2019-06-03 | Disposition: A | Discharge status: Home / Self Care | Payer: BC Managed Care – PPO | Source: Ambulatory Visit | Attending: Cardiology | Admitting: Cardiology

## 2019-06-03 DIAGNOSIS — I6521 Occlusion and stenosis of right carotid artery: Secondary | ICD-10-CM

## 2019-06-03 DIAGNOSIS — I6523 Occlusion and stenosis of bilateral carotid arteries: Secondary | ICD-10-CM | POA: Insufficient documentation

## 2019-06-04 ENCOUNTER — Telehealth (HOSPITAL_COMMUNITY): Payer: Self-pay | Admitting: *Deleted

## 2019-06-04 ENCOUNTER — Other Ambulatory Visit: Payer: Self-pay | Admitting: *Deleted

## 2019-06-04 ENCOUNTER — Encounter: Payer: Self-pay | Admitting: *Deleted

## 2019-06-04 DIAGNOSIS — I6523 Occlusion and stenosis of bilateral carotid arteries: Secondary | ICD-10-CM

## 2019-06-04 NOTE — Telephone Encounter (Signed)
-----   Message from [name], MD sent at 06/04/2019 10:48 AM EST ----- Regarding: RE: [facility] for participation in group exercise at Pulmonary Rehab I have no objections to rehab. Thanks ----- Message ----- From: Rowe Pavy, RN Sent: 06/04/2019  10:28 AM EST To: [name], MD Subject: Ok for participation in group exercise at Pu#  Dr. Alen Blew  The above mutual pt referred to pulmonary rehab by Dr.Bensimhon.  Noted in her history she has Myelodysplastic Syndrome transfusion dependent.  With this type of anemia, any concerns regarding ability to participate in exercise ie fatigue/energy to exercise concerns in between lab draws and subsequent transfusions?  Pulmonary rehab meets every Tuesday and Thursdays.   Pt are here for one hour for exercise and restrictive bands.   Thanks for your input Maurice Small RN, BSN Cardiac and Pulmonary Rehab Nurse Navigator

## 2019-06-04 NOTE — Telephone Encounter (Signed)
Received referral from Dr. Haroldine Laws for this pt to participate in Pulmonary Rehab with the diagnosis of Pulmonary Hypertension.  Clarified with Dr. Haroldine Laws nurse, pt code status.  Pt was a DNR and now is a full code.  Reviewed pt medical history including office visits and concerned pt ability and appropriateness to participate in Pulmonary Rehab. Called and spoke to pt seeking clarity.  Pt is active within the home and community. For exercise she does "walk the pounds away" video.  Pt does not use an assistive device unless she is going to walk a long distance.  Pt wound to her buttock is healed.  Pt does receive blood transfusions due to chronic terminal anemia.  Pt states that she feels fine during the period of time in between her transfusions.  Pt is followed closely by her hematologist for lab draws and subsequent transfusion if warranted. Pt is eager to get in a supervised exercise program.  Based upon our discussion, pt may proceed with scheduling Pulmonary rehab.  Will have support staff verify insurance benefits and pass to pulmonary rehab staff for scheduling. Will seek clearance from Pt hematologist Dr. Alen Blew. Cherre Huger, BSN Cardiac and Training and development officer

## 2019-06-07 ENCOUNTER — Telehealth (HOSPITAL_COMMUNITY): Payer: Self-pay

## 2019-06-08 ENCOUNTER — Other Ambulatory Visit: Payer: Self-pay

## 2019-06-08 ENCOUNTER — Ambulatory Visit: Payer: BC Managed Care – PPO | Admitting: Nurse Practitioner

## 2019-06-08 ENCOUNTER — Encounter (HOSPITAL_COMMUNITY)
Admission: RE | Admit: 2019-06-08 | Discharge: 2019-06-08 | Disposition: A | Discharge status: Home / Self Care | Payer: BC Managed Care – PPO | Source: Ambulatory Visit | Attending: Internal Medicine | Admitting: Internal Medicine

## 2019-06-08 VITALS — BP 150/60 | HR 68 | Temp 97.2°F | Ht 64.0 in | Wt 181.2 lb

## 2019-06-08 DIAGNOSIS — I272 Pulmonary hypertension, unspecified: Secondary | ICD-10-CM | POA: Diagnosis not present

## 2019-06-08 NOTE — Progress Notes (Signed)
Pulmonary Individual Treatment Plan  Patient Details  Name: Patricia Mata MRN: [id number] Date of Birth: [birth year] Referring Provider:     Pulmonary Rehab Walk Test from 06/08/2019 in [facility]  Referring Provider  Dr. Haroldine Laws       Initial Encounter Date:    Pulmonary Rehab Walk Test from 06/08/2019 in [facility]  Date  06/08/19      Visit Diagnosis: Pulmonary hypertension ([place])  Patient's Home Medications on Admission:   Current Outpatient Medications:  .  allopurinol (ZYLOPRIM) 100 MG tablet, Take 100 mg by mouth daily., Disp: , Rfl:  .  amiodarone (PACERONE) 200 MG tablet, Take 200 mg by mouth daily., Disp: , Rfl:  .  amLODipine (NORVASC) 10 MG tablet, Take 1 tablet (10 mg total) by mouth daily., Disp: 90 tablet, Rfl: 3 .  atorvastatin (LIPITOR) 40 MG tablet, TAKE 1 TABLET BY MOUTH EVERY DAY, Disp: 90 tablet, Rfl: 0 .  blood glucose meter kit and supplies, Dispense based on patient and insurance preference. Use up to four times daily as directed. (FOR ICD-10 E10.9, E11.9)., Disp: 1 each, Rfl: 3 .  colchicine 0.6 MG tablet, Take 0.6 mg by mouth 2 (two) times daily as needed (for gout)., Disp: , Rfl:  .  doxazosin (CARDURA) 1 MG tablet, Take 1 tablet (1 mg total) by mouth at bedtime., Disp: 30 tablet, Rfl: 6 .  furosemide (LASIX) 40 MG tablet, TAKE 1 TABLET BY MOUTH EVERY DAY, Disp: 90 tablet, Rfl: 1 .  gabapentin (NEURONTIN) 300 MG capsule, , Disp: , Rfl:  .  glucose blood test strip, Use as instructed, Disp: 100 each, Rfl: 12 .  hydrALAZINE (APRESOLINE) 100 MG tablet, Take 1 tablet (100 mg total) by mouth 3 (three) times daily., Disp: 180 tablet, Rfl: 5 .  insulin glargine (LANTUS) 100 unit/mL SOPN, Inject 0.15 mLs (15 Units total) into the skin daily. (Patient taking differently: Inject 15 Units into the skin daily as needed (blood sugar over 150). ), Disp: , Rfl:  .  Insulin Pen Needle 29G X 5MM MISC, Use as  directed, Disp: 200 each, Rfl: 0 .  latanoprost (XALATAN) 0.005 % ophthalmic solution, Place 1 drop into both eyes at bedtime. , Disp: , Rfl:  .  levothyroxine (SYNTHROID) 88 MCG tablet, Take 1 tablet (88 mcg total) by mouth daily., Disp: 30 tablet, Rfl: 2 .  metoprolol succinate (TOPROL-XL) 25 MG 24 hr tablet, Take 1 tablet (25 mg total) by mouth daily. STOP taking this unless heart rate >100bpm OR directed by your doctor, Disp: 30 tablet, Rfl: 5 .  Multiple Vitamins-Minerals (CENTRUM SILVER 50+WOMEN) TABS, Take 1 tablet by mouth daily., Disp: , Rfl:  .  sildenafil (REVATIO) 20 MG tablet, Take 1 tablet (20 mg total) by mouth 3 (three) times daily., Disp: 270 tablet, Rfl: 3 .  sitaGLIPtin (JANUVIA) 50 MG tablet, Take 1 tablet (50 mg total) by mouth daily., Disp: 90 tablet, Rfl: 0 No current facility-administered medications for this encounter.   Facility-Administered Medications Ordered in Other Encounters:  .  sodium chloride flush (NS) 0.9 % injection 10 mL, 10 mL, Intravenous, PRN, Alen Blew, Mathis Dad, MD  Past Medical History: Past Medical History:  Diagnosis Date  . Acute kidney injury (nontraumatic) ([place])   . Acute renal failure (ARF) ([place]) 06/08/2018  . Cancer [facility]) 2008   Colon   . Diabetes mellitus without complication ([place])   . Gout 09/08/2018  . Hypertension   . Macrocytic  anemia     Tobacco Use: Social History   Tobacco Use  Smoking Status Former Smoker  . Packs/day: 0.25  . Years: 20.00  . Pack years: 5.00  . Types: Cigarettes  . Quit date: 01/31/2001  . 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[employer] Cardiac and Pulmonary Rehab Latest Ref Rng & Units 03/20/2019 04/19/2019 04/23/2019 04/23/2019 04/23/2019   Cholestrol 0 - 200 mg/dL 143 - - - -   LDLCALC 0 - 99 mg/dL 86 - - - -   HDL >40 mg/dL 48 - - - -   Trlycerides <150 mg/dL 43 - - - -   Hemoglobin A1c 4.8 - 5.6 % 6.7(H) 6.5(H) - - -   PHART 7.350 -  7.450 - - - - -   PCO2ART 32.0 - 48.0 mmHg - - - - -   HCO3 20.0 - 28.0 mmol/L - - 22.7 23.8 22.9   TCO2 22 - 32 mmol/L - - '24 25 24   ' ACIDBASEDEF 0.0 - 2.0 mmol/L - - 2.0 1.0 2.0   O2SAT % - - 73.0 75.0 77.0      Capillary Blood Glucose: Lab Results  Component Value Date   GLUCAP 159 (H) 04/23/2019   GLUCAP 218 (H) 03/23/2019   GLUCAP 365 (H) 03/23/2019   GLUCAP 396 (H) 03/22/2019   GLUCAP 242 (H) 03/22/2019     Pulmonary Assessment Scores: Pulmonary Assessment Scores    Row Name 06/08/19 1515 06/08/19 1551       ADL UCSD   ADL Phase  Entry  Entry    SOB Score total  12  -      CAT Score   CAT Score  10  -      mMRC Score   mMRC Score  -  3      UCSD: Self-administered rating of dyspnea associated with activities of daily living (ADLs) 6-point scale (0 = "not at all" to 5 = "maximal or unable to do because of breathlessness")  Scoring Scores range from 0 to 120.  Minimally important difference is 5 units  CAT: CAT can identify the health impairment of COPD patients and is better correlated with disease progression.  CAT has a scoring range of zero to 40. The CAT score is classified into four groups of low (less than 10), medium (10 - 20), high (21-30) and very high (31-40) based on the impact level of disease on health status. A CAT score over 10 suggests significant symptoms.  A worsening CAT score could be explained by an exacerbation, poor medication adherence, poor inhaler technique, or progression of COPD or comorbid conditions.  CAT MCID is 2 points  mMRC: mMRC (Modified Medical Research Council) Dyspnea Scale is used to assess the degree of baseline functional disability in patients of respiratory disease due to dyspnea. No minimal important difference is established. A decrease in score of 1 point or greater is considered a positive change.   Pulmonary Function Assessment: Pulmonary Function Assessment - 06/08/19 1515      Breath   Bilateral Breath  Sounds  Clear    Shortness of Breath  No       Exercise Target Goals: Exercise Program Goal: Individual exercise prescription set using results from initial 6 min walk test and THRR while considering  patient's activity barriers and safety.   Exercise Prescription Goal: Initial exercise prescription builds to 30-45 minutes a  day of aerobic activity, 2-3 days per week.  Home exercise guidelines will be given to patient during program as part of exercise prescription that the participant will acknowledge.  Activity Barriers & Risk Stratification: Activity Barriers & Cardiac Risk Stratification - 06/08/19 1504      Activity Barriers & Cardiac Risk Stratification   Activity Barriers  Arthritis;Deconditioning;Muscular Weakness       6 Minute Walk: 6 Minute Walk    Row Name 06/08/19 1552         6 Minute Walk   Phase  Initial     Distance  500 feet     Walk Time  4.08 minutes     # of Rest Breaks  3     MPH  0.95     METS  1.98     RPE  13     Perceived Dyspnea   0     VO2 Peak  6.93     Symptoms  Yes (comment)     Comments  3 seated rest breaks 30 sec, 30 sec, 55 sec all due to leg fatigue     Resting HR  71 bpm     Resting BP  144/56     Resting Oxygen Saturation   100 %     Exercise Oxygen Saturation  during 6 min walk  99 %     Max Ex. HR  100 bpm     Max Ex. BP  184/60     2 Minute Post BP  156/48       Interval HR   1 Minute HR  94     2 Minute HR  96     3 Minute HR  95     4 Minute HR  91     5 Minute HR  100     6 Minute HR  100     2 Minute Post HR  79     Interval Heart Rate?  Yes       Interval Oxygen   Interval Oxygen?  Yes     Baseline Oxygen Saturation %  100 %     1 Minute Oxygen Saturation %  99 %     1 Minute Liters of Oxygen  0 L     2 Minute Oxygen Saturation %  100 %     2 Minute Liters of Oxygen  0 L     3 Minute Oxygen Saturation %  99 %     3 Minute Liters of Oxygen  0 L     4 Minute Oxygen Saturation %  99 %     4 Minute Liters of  Oxygen  0 L     5 Minute Oxygen Saturation %  99 %     5 Minute Liters of Oxygen  0 L     6 Minute Oxygen Saturation %  99 %     6 Minute Liters of Oxygen  0 L     2 Minute Post Oxygen Saturation %  100 %     2 Minute Post Liters of Oxygen  0 L        Oxygen Initial Assessment: Oxygen Initial Assessment - 06/08/19 1551      Home Oxygen   Home Oxygen Device  None    Sleep Oxygen Prescription  None    Home Exercise Oxygen Prescription  None    Home at Rest Exercise Oxygen Prescription  None    Compliance  with Home Oxygen Use  Yes      Initial 6 min Walk   Oxygen Used  None      Program Oxygen Prescription   Program Oxygen Prescription  None      Intervention   Short Term Goals  To learn and exhibit compliance with exercise, home and travel O2 prescription;To learn and understand importance of monitoring SPO2 with pulse oximeter and demonstrate accurate use of the pulse oximeter.;To learn and understand importance of maintaining oxygen saturations>88%;To learn and demonstrate proper pursed lip breathing techniques or other breathing techniques.;To learn and demonstrate proper use of respiratory medications    Long  Term Goals  Exhibits compliance with exercise, home and travel O2 prescription;Maintenance of O2 saturations>88%;Verbalizes importance of monitoring SPO2 with pulse oximeter and return demonstration;Exhibits proper breathing techniques, such as pursed lip breathing or other method taught during program session;Compliance with respiratory medication       Oxygen Re-Evaluation:   Oxygen Discharge (Final Oxygen Re-Evaluation):   Initial Exercise Prescription: Initial Exercise Prescription - 06/08/19 1500      Date of Initial Exercise RX and Referring Provider   Date  06/08/19    Referring Provider  Dr. Haroldine Laws       NuStep   Level  2    SPM  80    Minutes  30      Prescription Details   Frequency (times per week)  2    Duration  Progress to 30 minutes of  continuous aerobic without signs/symptoms of physical distress      Intensity   THRR 40-80% of Max Heartrate  62-125    Ratings of Perceived Exertion  11-13    Perceived Dyspnea  0-4      Progression   Progression  Continue progressive overload as per policy without signs/symptoms or physical distress.      Resistance Training   Training Prescription  Yes    Weight  orange bands    Reps  10-15       Perform Capillary Blood Glucose checks as needed.  Exercise Prescription Changes:   Exercise Comments:   Exercise Goals and Review: Exercise Goals    Row Name 06/08/19 1558             Exercise Goals   Increase Physical Activity  Yes       Intervention  Provide advice, education, support and counseling about physical activity/exercise needs.;Develop an individualized exercise prescription for aerobic and resistive training based on initial evaluation findings, risk stratification, comorbidities and participant's personal goals.       Expected Outcomes  Short Term: Attend rehab on a regular basis to increase amount of physical activity.;Long Term: Add in home exercise to make exercise part of routine and to increase amount of physical activity.;Long Term: Exercising regularly at least 3-5 days a week.       Increase Strength and Stamina  Yes       Intervention  Provide advice, education, support and counseling about physical activity/exercise needs.;Develop an individualized exercise prescription for aerobic and resistive training based on initial evaluation findings, risk stratification, comorbidities and participant's personal goals.       Expected Outcomes  Short Term: Increase workloads from initial exercise prescription for resistance, speed, and METs.;Short Term: Perform resistance training exercises routinely during rehab and add in resistance training at home;Long Term: Improve cardiorespiratory fitness, muscular endurance and strength as measured by increased METs and  functional capacity (6MWT)       Able  to understand and use rate of perceived exertion (RPE) scale  Yes       Intervention  Provide education and explanation on how to use RPE scale       Expected Outcomes  Short Term: Able to use RPE daily in rehab to express subjective intensity level;Long Term:  Able to use RPE to guide intensity level when exercising independently       Able to understand and use Dyspnea scale  Yes       Intervention  Provide education and explanation on how to use Dyspnea scale       Expected Outcomes  Short Term: Able to use Dyspnea scale daily in rehab to express subjective sense of shortness of breath during exertion;Long Term: Able to use Dyspnea scale to guide intensity level when exercising independently       Knowledge and understanding of Target Heart Rate Range (THRR)  Yes       Expected Outcomes  Short Term: Able to state/look up THRR;Short Term: Able to use daily as guideline for intensity in rehab;Long Term: Able to use THRR to govern intensity when exercising independently       Understanding of Exercise Prescription  Yes       Intervention  Provide education, explanation, and written materials on patient's individual exercise prescription       Expected Outcomes  Short Term: Able to explain program exercise prescription;Long Term: Able to explain home exercise prescription to exercise independently          Exercise Goals Re-Evaluation :   Discharge Exercise Prescription (Final Exercise Prescription Changes):   Nutrition:  Target Goals: Understanding of nutrition guidelines, daily intake of sodium <1575m, cholesterol <2034m calories 30% from fat and 7% or less from saturated fats, daily to have 5 or more servings of fruits and vegetables.  Biometrics: Pre Biometrics - 06/08/19 1505      Pre Biometrics   Height  '5\' 4"'  (1.626 m)    Weight  82.2 kg    BMI (Calculated)  31.09    Grip Strength  21 kg        Nutrition Therapy Plan and Nutrition  Goals:   Nutrition Assessments:   Nutrition Goals Re-Evaluation:   Nutrition Goals Discharge (Final Nutrition Goals Re-Evaluation):   Psychosocial: Target Goals: Acknowledge presence or absence of significant depression and/or stress, maximize coping skills, provide positive support system. Participant is able to verbalize types and ability to use techniques and skills needed for reducing stress and depression.  Initial Review & Psychosocial Screening: Initial Psych Review & Screening - 06/08/19 1517      Initial Review   Current issues with  None Identified      Family Dynamics   Good Support System?  Yes      Barriers   Psychosocial barriers to participate in program  There are no identifiable barriers or psychosocial needs.      Screening Interventions   Interventions  Encouraged to exercise       Quality of Life Scores:  Scores of 19 and below usually indicate a poorer quality of life in these areas.  A difference of  2-3 points is a clinically meaningful difference.  A difference of 2-3 points in the total score of the Quality of Life Index has been associated with significant improvement in overall quality of life, self-image, physical symptoms, and general health in studies assessing change in quality of life.  PHQ-9: Recent Review Flowsheet Data    Depression  screen [facility] 2/9 06/08/2019 12/09/2018 09/09/2018   Decreased Interest 0 0 0   Down, Depressed, Hopeless 0 0 0   PHQ - 2 Score 0 0 0   Altered sleeping 0 - -   Tired, decreased energy 0 - -   Change in appetite 0 - -   Feeling bad or failure about yourself  0 - -   Trouble concentrating 0 - -   Moving slowly or fidgety/restless 0 - -   Suicidal thoughts 0 - -   PHQ-9 Score 0 - -   Difficult doing work/chores Not difficult at all - -     Interpretation of Total Score  Total Score Depression Severity:  1-4 = Minimal depression, 5-9 = Mild depression, 10-14 = Moderate depression, 15-19 = Moderately severe  depression, 20-27 = Severe depression   Psychosocial Evaluation and Intervention: Psychosocial Evaluation - 06/08/19 1517      Psychosocial Evaluation & Interventions   Interventions  Encouraged to exercise with the program and follow exercise prescription    Continue Psychosocial Services   No Follow up required       Psychosocial Re-Evaluation: Psychosocial Re-Evaluation    [place] Name 06/08/19 1518             Psychosocial Re-Evaluation   Current issues with  None Identified       Interventions  Encouraged to attend Pulmonary Rehabilitation for the exercise       Continue Psychosocial Services   No Follow up required          Psychosocial Discharge (Final Psychosocial Re-Evaluation): Psychosocial Re-Evaluation - 06/08/19 1518      Psychosocial Re-Evaluation   Current issues with  None Identified    Interventions  Encouraged to attend Pulmonary Rehabilitation for the exercise    Continue Psychosocial Services   No Follow up required       Education: Education Goals: Education classes will be provided on a weekly basis, covering required topics. Participant will state understanding/return demonstration of topics presented.  Learning Barriers/Preferences: Learning Barriers/Preferences - 06/08/19 1518      Learning Barriers/Preferences   Learning Barriers  None    Learning Preferences  Audio;Computer/Internet;Group Instruction;Individual Instruction;Pictoral;Skilled Demonstration;Verbal Instruction;Written Material;Video       Education Topics: Risk Factor Reduction:  -Group instruction that is supported by a PowerPoint presentation. Instructor discusses the definition of a risk factor, different risk factors for pulmonary disease, and how the heart and lungs work together.     Nutrition for Pulmonary Patient:  -Group instruction provided by PowerPoint slides, verbal discussion, and written materials to support subject matter. The instructor gives an explanation and  review of healthy diet recommendations, which includes a discussion on weight management, recommendations for fruit and vegetable consumption, as well as protein, fluid, caffeine, fiber, sodium, sugar, and alcohol. Tips for eating when patients are short of breath are discussed.   Pursed Lip Breathing:  -Group instruction that is supported by demonstration and informational handouts. Instructor discusses the benefits of pursed lip and diaphragmatic breathing and detailed demonstration on how to preform both.     Oxygen Safety:  -Group instruction provided by PowerPoint, verbal discussion, and written material to support subject matter. There is an overview of "What is Oxygen" and "Why do we need it".  Instructor also reviews how to create a safe environment for oxygen use, the importance of using oxygen as prescribed, and the risks of noncompliance. There is a brief discussion on traveling with oxygen and resources the patient  may utilize.   Oxygen Equipment:  -Group instruction provided by [facility] Staff utilizing handouts, written materials, and equipment demonstrations.   Signs and Symptoms:  -Group instruction provided by written material and verbal discussion to support subject matter. Warning signs and symptoms of infection, stroke, and heart attack are reviewed and when to call the physician/911 reinforced. Tips for preventing the spread of infection discussed.   Advanced Directives:  -Group instruction provided by verbal instruction and written material to support subject matter. Instructor reviews Advanced Directive laws and proper instruction for filling out document.   Pulmonary Video:  -Group video education that reviews the importance of medication and oxygen compliance, exercise, good nutrition, pulmonary hygiene, and pursed lip and diaphragmatic breathing for the pulmonary patient.   Exercise for the Pulmonary Patient:  -Group instruction that is supported by a  PowerPoint presentation. Instructor discusses benefits of exercise, core components of exercise, frequency, duration, and intensity of an exercise routine, importance of utilizing pulse oximetry during exercise, safety while exercising, and options of places to exercise outside of rehab.     Pulmonary Medications:  -Verbally interactive group education provided by instructor with focus on inhaled medications and proper administration.   Anatomy and Physiology of the Respiratory System and Intimacy:  -Group instruction provided by PowerPoint, verbal discussion, and written material to support subject matter. Instructor reviews respiratory cycle and anatomical components of the respiratory system and their functions. Instructor also reviews differences in obstructive and restrictive respiratory diseases with examples of each. Intimacy, Sex, and Sexuality differences are reviewed with a discussion on how relationships can change when diagnosed with pulmonary disease. Common sexual concerns are reviewed.   MD DAY -A group question and answer session with a medical doctor that allows participants to ask questions that relate to their pulmonary disease state.   OTHER EDUCATION -Group or individual verbal, written, or video instructions that support the educational goals of the pulmonary rehab program.   Holiday Eating Survival Tips:  -Group instruction provided by PowerPoint slides, verbal discussion, and written materials to support subject matter. The instructor gives patients tips, tricks, and techniques to help them not only survive but enjoy the holidays despite the onslaught of food that accompanies the holidays.   Knowledge Questionnaire Score: Knowledge Questionnaire Score - 06/08/19 1519      Knowledge Questionnaire Score   Pre Score  9/18       Core Components/Risk Factors/Patient Goals at Admission: Personal Goals and Risk Factors at Admission - 06/08/19 1531      Core  Components/Risk Factors/Patient Goals on Admission   Improve shortness of breath with ADL's  Yes    Intervention  Provide education, individualized exercise plan and daily activity instruction to help decrease symptoms of SOB with activities of daily living.    Expected Outcomes  Short Term: Improve cardiorespiratory fitness to achieve a reduction of symptoms when performing ADLs;Long Term: Be able to perform more ADLs without symptoms or delay the onset of symptoms       Core Components/Risk Factors/Patient Goals Review:    Core Components/Risk Factors/Patient Goals at Discharge (Final Review):    ITP Comments:   Comments:

## 2019-06-08 NOTE — Progress Notes (Signed)
Patricia Mata 63 y.o. female Pulmonary Rehab Orientation Note Patient arrived today in Cardiac and Pulmonary Rehab for orientation to Pulmonary Rehab. She was dropped off at the [facility] entrance and did not have to walk very far. She does not carry portable oxygen. Per pt, she uses oxygen never. Color good, skin warm and dry. Patient is oriented to time and place. Patient's medical history, psychosocial health, and medications reviewed. Psychosocial assessment reveals pt lives with their spouse. Pt is currently retired. Pt hobbies include crocheting and activities with her church. Pt reports her stress level is low.   Pt does not exhibit  signs of depression. PHQ2/9 score 0/0. Pt shows good  coping skills with positive outlook . Will continue to monitor and evaluate progress toward psychosocial goal(s) of no barriers or psychosocial concerns while in pulmonary rehab. Physical assessment reveals heart rate is normal, breath sounds clear to auscultation, no wheezes, rales, or rhonchi. Grip strength equal, strong. Patient reports she does take medications as prescribed. Patient states she follows a Diabetic diet. She has lost 50 plus pounds since March of 2020.Marland Kitchen Patient's weight will be monitored closely. Demonstration and practice of PLB using pulse oximeter. Patient able to return demonstration satisfactorily. Safety and hand hygiene in the exercise area reviewed with patient. Patient voices understanding of the information reviewed. Department expectations discussed with patient and achievable goals were set. The patient shows enthusiasm about attending the program and we look forward to working with this nice lady. The patient completed a 6 min walk test today and to begin exercise on Tuesday, June 22, 2019 in the 1300 class.  1430-1600

## 2019-06-14 ENCOUNTER — Telehealth: Payer: Self-pay | Admitting: Oncology

## 2019-06-15 ENCOUNTER — Inpatient Hospital Stay: Payer: BC Managed Care – PPO

## 2019-06-15 ENCOUNTER — Inpatient Hospital Stay: Payer: BC Managed Care – PPO | Attending: Oncology

## 2019-06-15 ENCOUNTER — Other Ambulatory Visit: Payer: Self-pay

## 2019-06-15 VITALS — BP 158/34 | HR 62 | Temp 98.5°F | Resp 17

## 2019-06-15 DIAGNOSIS — D469 Myelodysplastic syndrome, unspecified: Secondary | ICD-10-CM | POA: Insufficient documentation

## 2019-06-15 DIAGNOSIS — D631 Anemia in chronic kidney disease: Secondary | ICD-10-CM

## 2019-06-15 DIAGNOSIS — N189 Chronic kidney disease, unspecified: Secondary | ICD-10-CM

## 2019-06-15 LAB — CBC WITH DIFFERENTIAL (CANCER CENTER ONLY)
Abs Immature Granulocytes: 0.06 10*3/uL (ref 0.00–0.07)
Basophils Absolute: 0 10*3/uL (ref 0.0–0.1)
Basophils Relative: 0 %
Eosinophils Absolute: 0.1 10*3/uL (ref 0.0–0.5)
Eosinophils Relative: 1 %
HCT: 20.3 % — ABNORMAL LOW (ref 36.0–46.0)
Hemoglobin: 6.5 g/dL — CL (ref 12.0–15.0)
Immature Granulocytes: 1 %
Lymphocytes Relative: 8 %
Lymphs Abs: 0.5 10*3/uL — ABNORMAL LOW (ref 0.7–4.0)
MCH: 29 pg (ref 26.0–34.0)
MCHC: 32 g/dL (ref 30.0–36.0)
MCV: 90.6 fL (ref 80.0–100.0)
Monocytes Absolute: 1 10*3/uL (ref 0.1–1.0)
Monocytes Relative: 15 %
Neutro Abs: 5.3 10*3/uL (ref 1.7–7.7)
Neutrophils Relative %: 75 %
Platelet Count: 176 10*3/uL (ref 150–400)
RBC: 2.24 MIL/uL — ABNORMAL LOW (ref 3.87–5.11)
RDW: 21.3 % — ABNORMAL HIGH (ref 11.5–15.5)
WBC Count: 7 10*3/uL (ref 4.0–10.5)
nRBC: 0.3 % — ABNORMAL HIGH (ref 0.0–0.2)

## 2019-06-15 LAB — CMP (CANCER CENTER ONLY)
ALT: 11 U/L (ref 0–44)
AST: 24 U/L (ref 15–41)
Albumin: 3.7 g/dL (ref 3.5–5.0)
Alkaline Phosphatase: 99 U/L (ref 38–126)
Anion gap: 10 (ref 5–15)
BUN: 50 mg/dL — ABNORMAL HIGH (ref 8–23)
CO2: 24 mmol/L (ref 22–32)
Calcium: 9.4 mg/dL (ref 8.9–10.3)
Chloride: 102 mmol/L (ref 98–111)
Creatinine: 2.5 mg/dL — ABNORMAL HIGH (ref 0.44–1.00)
GFR, Est AFR Am: 23 mL/min — ABNORMAL LOW (ref >60)
GFR, Estimated: 20 mL/min — ABNORMAL LOW (ref >60)
Glucose, Bld: 263 mg/dL — ABNORMAL HIGH (ref 70–99)
Potassium: 4.4 mmol/L (ref 3.5–5.1)
Sodium: 136 mmol/L (ref 135–145)
Total Bilirubin: 0.6 mg/dL (ref 0.3–1.2)
Total Protein: 7.3 g/dL (ref 6.5–8.1)

## 2019-06-15 LAB — SAMPLE TO BLOOD BANK

## 2019-06-15 LAB — PREPARE RBC (CROSSMATCH)

## 2019-06-15 MED ORDER — DIPHENHYDRAMINE HCL 25 MG PO CAPS
ORAL_CAPSULE | ORAL | Status: AC
  Filled 2019-06-15: qty 1

## 2019-06-15 MED ORDER — FUROSEMIDE 10 MG/ML IJ SOLN: 20.0000 mg | Freq: Once | INTRAMUSCULAR | Status: DC

## 2019-06-15 MED ORDER — DIPHENHYDRAMINE HCL 25 MG PO CAPS
25.0000 mg | ORAL_CAPSULE | Freq: Once | ORAL | Status: AC
  Administered 2019-06-15: 25 mg via ORAL

## 2019-06-15 MED ORDER — LUSPATERCEPT-AAMT 75 MG ~~LOC~~ SOLR
1.0000 mg/kg | Freq: Once | SUBCUTANEOUS | Status: DC
  Filled 2019-06-15: qty 1.6

## 2019-06-15 MED ORDER — SODIUM CHLORIDE 0.9% IV SOLUTION
250.0000 mL | Freq: Once | INTRAVENOUS | Status: AC
  Administered 2019-06-15: 250 mL via INTRAVENOUS
  Filled 2019-06-15: qty 250

## 2019-06-15 MED ORDER — ACETAMINOPHEN 325 MG PO TABS
650.0000 mg | ORAL_TABLET | Freq: Once | ORAL | Status: AC
  Administered 2019-06-15: 650 mg via ORAL

## 2019-06-15 MED ORDER — LUSPATERCEPT-AAMT 75 MG ~~LOC~~ SOLR
75.0000 mg | Freq: Once | SUBCUTANEOUS | Status: AC
  Administered 2019-06-15: 75 mg via SUBCUTANEOUS
  Filled 2019-06-15: qty 1.5

## 2019-06-15 MED ORDER — ACETAMINOPHEN 325 MG PO TABS
ORAL_TABLET | ORAL | Status: AC
  Filled 2019-06-15: qty 2

## 2019-06-15 NOTE — Patient Instructions (Signed)
Blood Transfusion, Adult, Care After This sheet gives you information about how to care for yourself after your procedure. Your doctor may also give you more specific instructions. If you have problems or questions, contact your doctor. Follow these instructions at home:   Take over-the-counter and prescription medicines only as told by your doctor.  Go back to your normal activities as told by your doctor.  Follow instructions from your doctor about how to take care of the area where an IV tube was put into your vein (insertion site). Make sure you: ? Wash your hands with soap and water before you change your bandage (dressing). If there is no soap and water, use hand sanitizer. ? Change your bandage as told by your doctor.  Check your IV insertion site every day for signs of infection. Check for: ? More redness, swelling, or pain. ? More fluid or blood. ? Warmth. ? Pus or a bad smell. Contact a doctor if:  You have more redness, swelling, or pain around the IV insertion site.  You have more fluid or blood coming from the IV insertion site.  Your IV insertion site feels warm to the touch.  You have pus or a bad smell coming from the IV insertion site.  Your pee (urine) turns pink, red, or brown.  You feel weak after doing your normal activities. Get help right away if:  You have signs of a serious allergic or body defense (immune) system reaction, including: ? Itchiness. ? Hives. ? Trouble breathing. ? Anxiety. ? Pain in your chest or lower back. ? Fever, flushing, and chills. ? Fast pulse. ? Rash. ? Watery poop (diarrhea). ? Throwing up (vomiting). ? Dark pee. ? Serious headache. ? Dizziness. ? Stiff neck. ? Yellow color in your face or the white parts of your eyes (jaundice). Summary  After a blood transfusion, return to your normal activities as told by your doctor.  Every day, check for signs of infection where the IV tube was put into your vein.  Some  signs of infection are warm skin, more redness and pain, more fluid or blood, and pus or a bad smell where the needle went in.  Contact your doctor if you feel weak or have any unusual symptoms. This information is not intended to replace advice given to you by your health care provider. Make sure you discuss any questions you have with your health care provider. Document Released: 07/22/2014 Document Revised: 11/05/2017 Document Reviewed: 02/23/2016 Elsevier Patient Education  2020 Elsevier Inc.  

## 2019-06-15 NOTE — Progress Notes (Signed)
Lab called with hemoglobin result of 6.5. Dr. Alen Blew made aware and 2 units of blood ordered. Lasix to be given post transfusion completion. Benjie Karvonen, infusion RN made aware.

## 2019-06-15 NOTE — Progress Notes (Signed)
Labs reported to Dr. Alen Blew. Received OK to treat with Reblozyl. Per blood bank, many antibodies have been detected in patient's blood, which necessitates more testing. Informed by blood bank that blood may not be ready until until 5pm. Pt plans to receive 1 unit today and is scheduled for the 2nd unit tomorrow. MD aware.

## 2019-06-16 ENCOUNTER — Other Ambulatory Visit: Payer: Self-pay

## 2019-06-16 ENCOUNTER — Inpatient Hospital Stay: Payer: BC Managed Care – PPO

## 2019-06-16 DIAGNOSIS — D469 Myelodysplastic syndrome, unspecified: Secondary | ICD-10-CM | POA: Diagnosis not present

## 2019-06-16 LAB — PREPARE RBC (CROSSMATCH)

## 2019-06-16 MED ORDER — ACETAMINOPHEN 325 MG PO TABS
ORAL_TABLET | ORAL | Status: AC
  Filled 2019-06-16: qty 2

## 2019-06-16 MED ORDER — DIPHENHYDRAMINE HCL 25 MG PO CAPS
25.0000 mg | ORAL_CAPSULE | Freq: Once | ORAL | Status: AC
  Administered 2019-06-16: 25 mg via ORAL

## 2019-06-16 MED ORDER — ACETAMINOPHEN 325 MG PO TABS
650.0000 mg | ORAL_TABLET | Freq: Once | ORAL | Status: AC
  Administered 2019-06-16: 650 mg via ORAL

## 2019-06-16 MED ORDER — DIPHENHYDRAMINE HCL 25 MG PO CAPS
ORAL_CAPSULE | ORAL | Status: AC
  Filled 2019-06-16: qty 1

## 2019-06-16 MED ORDER — FUROSEMIDE 10 MG/ML IJ SOLN: 20.0000 mg | Freq: Once | INTRAMUSCULAR | Status: DC

## 2019-06-16 MED ORDER — SODIUM CHLORIDE 0.9% IV SOLUTION
250.0000 mL | Freq: Once | INTRAVENOUS | Status: AC
  Administered 2019-06-16: 250 mL via INTRAVENOUS
  Filled 2019-06-16: qty 250

## 2019-06-16 NOTE — Patient Instructions (Signed)
Blood Transfusion, Adult, Care After This sheet gives you information about how to care for yourself after your procedure. Your doctor may also give you more specific instructions. If you have problems or questions, contact your doctor. Follow these instructions at home:   Take over-the-counter and prescription medicines only as told by your doctor.  Go back to your normal activities as told by your doctor.  Follow instructions from your doctor about how to take care of the area where an IV tube was put into your vein (insertion site). Make sure you: ? Wash your hands with soap and water before you change your bandage (dressing). If there is no soap and water, use hand sanitizer. ? Change your bandage as told by your doctor.  Check your IV insertion site every day for signs of infection. Check for: ? More redness, swelling, or pain. ? More fluid or blood. ? Warmth. ? Pus or a bad smell. Contact a doctor if:  You have more redness, swelling, or pain around the IV insertion site.  You have more fluid or blood coming from the IV insertion site.  Your IV insertion site feels warm to the touch.  You have pus or a bad smell coming from the IV insertion site.  Your pee (urine) turns pink, red, or brown.  You feel weak after doing your normal activities. Get help right away if:  You have signs of a serious allergic or body defense (immune) system reaction, including: ? Itchiness. ? Hives. ? Trouble breathing. ? Anxiety. ? Pain in your chest or lower back. ? Fever, flushing, and chills. ? Fast pulse. ? Rash. ? Watery poop (diarrhea). ? Throwing up (vomiting). ? Dark pee. ? Serious headache. ? Dizziness. ? Stiff neck. ? Yellow color in your face or the white parts of your eyes (jaundice). Summary  After a blood transfusion, return to your normal activities as told by your doctor.  Every day, check for signs of infection where the IV tube was put into your vein.  Some  signs of infection are warm skin, more redness and pain, more fluid or blood, and pus or a bad smell where the needle went in.  Contact your doctor if you feel weak or have any unusual symptoms. This information is not intended to replace advice given to you by your health care provider. Make sure you discuss any questions you have with your health care provider. Document Released: 07/22/2014 Document Revised: 11/05/2017 Document Reviewed: 02/23/2016 Elsevier Patient Education  2020 Elsevier Inc.  Coronavirus (COVID-19) Are you at risk?  Are you at risk for the Coronavirus (COVID-19)?  To be considered HIGH RISK for Coronavirus (COVID-19), you have to meet the following criteria:  . Traveled to [place], [place], [place], [place] or [place]; or in the [place] to [place], [place], [place], or [place]; and have fever, cough, and shortness of breath within the last 2 weeks of travel OR . Been in close contact with a person diagnosed with COVID-19 within the last 2 weeks and have fever, cough, and shortness of breath . IF YOU DO NOT MEET THESE CRITERIA, YOU ARE CONSIDERED LOW RISK FOR COVID-19.  What to do if you are HIGH RISK for COVID-19?  . If you are having a medical emergency, call 911. . Seek medical care right away. Before you go to a doctor's office, urgent care or emergency department, call ahead and tell them about your recent travel, contact with someone diagnosed with COVID-19, and   your symptoms. You should receive instructions from your physician's office regarding next steps of care.  . When you arrive at healthcare provider, tell the healthcare staff immediately you have returned from visiting [place], [place], [place], [place] or [place]; or traveled in the [place] to [place], [place], [place], or [place]; in the last two weeks or you have been in close contact with a person diagnosed with COVID-19 in the last 2 weeks.   . Tell the health care staff about  your symptoms: fever, cough and shortness of breath. . After you have been seen by a medical provider, you will be either: o Tested for (COVID-19) and discharged home on quarantine except to seek medical care if symptoms worsen, and asked to  - Stay home and avoid contact with others until you get your results (4-5 days)  - Avoid travel on public transportation if possible (such as bus, train, or airplane) or o Sent to the Emergency Department by EMS for evaluation, COVID-19 testing, and possible admission depending on your condition and test results.  What to do if you are LOW RISK for COVID-19?  Reduce your risk of any infection by using the same precautions used for avoiding the common cold or flu:  . Wash your hands often with soap and warm water for at least 20 seconds.  If soap and water are not readily available, use an alcohol-based hand sanitizer with at least 60% alcohol.  . If coughing or sneezing, cover your mouth and nose by coughing or sneezing into the elbow areas of your shirt or coat, into a tissue or into your sleeve (not your hands). . Avoid shaking hands with others and consider head nods or verbal greetings only. . Avoid touching your eyes, nose, or mouth with unwashed hands.  . Avoid close contact with people who are sick. . Avoid places or events with large numbers of people in one location, like concerts or sporting events. . Carefully consider travel plans you have or are making. . If you are planning any travel outside or inside the [place], visit the CDC's Travelers' Health webpage for the latest health notices. . If you have some symptoms but not all symptoms, continue to monitor at home and seek medical attention if your symptoms worsen. . If you are having a medical emergency, call 911.   ADDITIONAL HEALTHCARE OPTIONS FOR PATIENTS  [facility] / e-Visit: [email or web address]         [facility]:  [phone]  [facility]: [phone]                   [facility]: [phone]   

## 2019-06-16 NOTE — Progress Notes (Signed)
Patient did not receive blood yesterday due to antibodies per blood bank. Orders placed again for transfusion today.

## 2019-06-17 ENCOUNTER — Inpatient Hospital Stay: Payer: BC Managed Care – PPO

## 2019-06-17 ENCOUNTER — Ambulatory Visit: Payer: BC Managed Care – PPO | Admitting: Nurse Practitioner

## 2019-06-17 ENCOUNTER — Other Ambulatory Visit: Payer: Self-pay

## 2019-06-17 DIAGNOSIS — D469 Myelodysplastic syndrome, unspecified: Secondary | ICD-10-CM | POA: Diagnosis not present

## 2019-06-17 MED ORDER — DIPHENHYDRAMINE HCL 25 MG PO CAPS
25.0000 mg | ORAL_CAPSULE | Freq: Once | ORAL | Status: AC
  Administered 2019-06-17: 25 mg via ORAL

## 2019-06-17 MED ORDER — ACETAMINOPHEN 325 MG PO TABS
ORAL_TABLET | ORAL | Status: AC
  Filled 2019-06-17: qty 2

## 2019-06-17 MED ORDER — DIPHENHYDRAMINE HCL 25 MG PO CAPS
ORAL_CAPSULE | ORAL | Status: AC
  Filled 2019-06-17: qty 1

## 2019-06-17 MED ORDER — ACETAMINOPHEN 325 MG PO TABS
650.0000 mg | ORAL_TABLET | Freq: Once | ORAL | Status: AC
  Administered 2019-06-17: 650 mg via ORAL

## 2019-06-17 MED ORDER — SODIUM CHLORIDE 0.9% IV SOLUTION
250.0000 mL | Freq: Once | INTRAVENOUS | Status: AC
  Administered 2019-06-17: 250 mL via INTRAVENOUS
  Filled 2019-06-17: qty 250

## 2019-06-17 MED ORDER — FUROSEMIDE 10 MG/ML IJ SOLN
20.0000 mg | Freq: Once | INTRAMUSCULAR | Status: AC
  Administered 2019-06-17: 20 mg via INTRAVENOUS

## 2019-06-17 MED ORDER — FUROSEMIDE 10 MG/ML IJ SOLN
INTRAMUSCULAR | Status: AC
  Filled 2019-06-17: qty 2

## 2019-06-18 LAB — TYPE AND SCREEN
ABO/RH(D): A NEG
Antibody Screen: POSITIVE
DAT, IgG: NEGATIVE
Unit division: 0
Unit division: 0

## 2019-06-18 LAB — BPAM RBC
Blood Product Expiration Date: 202012152359
Blood Product Expiration Date: 202012162359
ISSUE DATE / TIME: 202012021518
ISSUE DATE / TIME: 202012031500
Unit Type and Rh: 600
Unit Type and Rh: 600

## 2019-06-22 ENCOUNTER — Encounter: Payer: Self-pay | Admitting: Nurse Practitioner

## 2019-06-22 ENCOUNTER — Ambulatory Visit (INDEPENDENT_AMBULATORY_CARE_PROVIDER_SITE_OTHER): Payer: BC Managed Care – PPO | Admitting: Nurse Practitioner

## 2019-06-22 ENCOUNTER — Other Ambulatory Visit: Payer: Self-pay

## 2019-06-22 ENCOUNTER — Encounter (HOSPITAL_COMMUNITY)
Admission: RE | Admit: 2019-06-22 | Discharge: 2019-06-22 | Disposition: A | Discharge status: Home / Self Care | Payer: BC Managed Care – PPO | Source: Ambulatory Visit | Attending: Internal Medicine | Admitting: Internal Medicine

## 2019-06-22 ENCOUNTER — Other Ambulatory Visit (HOSPITAL_COMMUNITY)
Admission: RE | Admit: 2019-06-22 | Discharge: 2019-06-22 | Disposition: A | Discharge status: Home / Self Care | Payer: BC Managed Care – PPO | Source: Ambulatory Visit | Attending: Nurse Practitioner | Admitting: Nurse Practitioner

## 2019-06-22 ENCOUNTER — Telehealth: Payer: Self-pay | Admitting: Oncology

## 2019-06-22 VITALS — BP 130/70 | HR 68 | Temp 97.9°F | Ht 64.2 in | Wt 181.8 lb

## 2019-06-22 DIAGNOSIS — Z0000 Encounter for general adult medical examination without abnormal findings: Secondary | ICD-10-CM

## 2019-06-22 DIAGNOSIS — Z124 Encounter for screening for malignant neoplasm of cervix: Secondary | ICD-10-CM | POA: Diagnosis present

## 2019-06-22 DIAGNOSIS — I272 Pulmonary hypertension, unspecified: Secondary | ICD-10-CM | POA: Diagnosis not present

## 2019-06-22 DIAGNOSIS — Z1231 Encounter for screening mammogram for malignant neoplasm of breast: Secondary | ICD-10-CM

## 2019-06-22 DIAGNOSIS — Z1211 Encounter for screening for malignant neoplasm of colon: Secondary | ICD-10-CM | POA: Diagnosis not present

## 2019-06-22 DIAGNOSIS — I10 Essential (primary) hypertension: Secondary | ICD-10-CM

## 2019-06-22 DIAGNOSIS — E1165 Type 2 diabetes mellitus with hyperglycemia: Secondary | ICD-10-CM

## 2019-06-22 DIAGNOSIS — E039 Hypothyroidism, unspecified: Secondary | ICD-10-CM

## 2019-06-22 LAB — POCT URINALYSIS DIPSTICK
Bilirubin, UA: NEGATIVE
Blood, UA: NEGATIVE
Glucose, UA: NEGATIVE
Ketones, UA: NEGATIVE
Leukocytes, UA: NEGATIVE
Nitrite, UA: NEGATIVE
Protein, UA: POSITIVE — AB
Spec Grav, UA: 1.01 (ref 1.010–1.025)
Urobilinogen, UA: 0.2 E.U./dL
pH, UA: 6 (ref 5.0–8.0)

## 2019-06-22 LAB — POCT UA - MICROALBUMIN
Creatinine, POC: 100 mg/dL
Microalbumin Ur, POC: 80 mg/L

## 2019-06-22 NOTE — Telephone Encounter (Signed)
Scheduled appt per 12/7 sch message.  Pt is aware of appt date and time

## 2019-06-22 NOTE — Progress Notes (Signed)
Daily Session Note  Patient Details  Name: Patricia Mata MRN: [id number] Date of Birth: Mar 15, 1956 Referring Provider:     Pulmonary Rehab Walk Test from 06/08/2019 in [facility]  Referring Provider  Dr. Haroldine Laws       Encounter Date: 06/22/2019  Check In: Session Check In - 06/22/19 1443      Check-In   Supervising physician immediately available to respond to emergencies  [facility] immediately available    Physician(s)  Dr. Benny Lennert    Location  [facility]    Staff Present  Rosebud Poles, RN, Bjorn Loser, MS, Exercise Physiologist;Lisa Ysidro Evert, RN    Virtual Visit  No    Medication changes reported      No    Fall or balance concerns reported     No    Tobacco Cessation  No Change    Warm-up and Cool-down  Performed on first and last piece of equipment    Resistance Training Performed  Yes    VAD Patient?  No    PAD/SET Patient?  No      Pain Assessment   Currently in Pain?  No/denies    Pain Score  0-No pain    Multiple Pain Sites  No       Capillary Blood Glucose: Results for orders placed or performed in visit on 06/22/19 (from the past 24 hour(s))  POCT Urinalysis Dipstick (21308)     Status: Abnormal   Collection Time: 06/22/19 12:00 PM  Result Value Ref Range   Color, UA yellow    Clarity, UA clear    Glucose, UA Negative Negative   Bilirubin, UA negative    Ketones, UA negative    Spec Grav, UA 1.010 1.010 - 1.025   Blood, UA negative    pH, UA 6.0 5.0 - 8.0   Protein, UA Positive (A) Negative   Urobilinogen, UA 0.2 0.2 or 1.0 E.U./dL   Nitrite, UA negative    Leukocytes, UA Negative Negative   Appearance     Odor    POCT UA - Microalbumin     Status: None   Collection Time: 06/22/19 12:02 PM  Result Value Ref Range   Microalbumin Ur, POC 80 mg/L   Creatinine, POC 100 mg/dL   Albumin/Creatinine Ratio, Urine, POC 30-300       Social History   Tobacco Use  Smoking Status Former  Smoker  . Packs/day: 0.25  . Years: 20.00  . Pack years: 5.00  . Types: Cigarettes  . Quit date: 01/31/2001  . Years since quitting: 18.4  Smokeless Tobacco Former Systems developer    Goals Met:  Independence with exercise equipment Exercise tolerated well Strength training completed today  Goals Unmet:  Not Applicable  Comments: Service time is from 1308 to 1415    Dr. Rush Farmer is Medical Director for Pulmonary Rehab at [facility].

## 2019-06-22 NOTE — Progress Notes (Signed)
This visit occurred during the SARS-CoV-2 public health emergency.  Safety protocols were in place, including screening questions prior to the visit, additional usage of staff PPE, and extensive cleaning of exam room while observing appropriate contact time as indicated for disinfecting solutions.  Subjective:     Patient ID: Patricia Mata , female    DOB: 1955-12-12 , 63 y.o.   MRN: [id number]   Chief Complaint  Patient presents with  . Annual Exam    HPI  Here for HM  Diabetes She presents for her follow-up diabetic visit. She has type 2 diabetes mellitus. Her disease course has been stable. Pertinent negatives for hypoglycemia include no dizziness or headaches. There are no diabetic associated symptoms. Pertinent negatives for diabetes include no chest pain, no fatigue, no polydipsia, no polyphagia and no polyuria. There are no hypoglycemic complications. Symptoms are stable. There are no diabetic complications. She is compliant with treatment all of the time. She is following a generally healthy diet. When asked about meal planning, she reported none. (87-151 range for blood sugar) An ACE inhibitor/angiotensin II receptor blocker is being taken. She does not see a podiatrist.Eye exam is not current.  Hypertension This is a chronic problem. The current episode started more than 1 year ago. The problem is controlled. Pertinent negatives include no anxiety, chest pain, headaches or palpitations. There are no associated agents to hypertension. Risk factors for coronary artery disease include obesity and sedentary lifestyle. There are no compliance problems.  There is no history of angina. There is no history of chronic renal disease.    The patient states she uses status post hysterectomy for birth control. Last LMP was No LMP recorded. Patient is postmenopausal.. Negative for Dysmenorrhea and Negative for Menorrhagia Mammogram last done unknown.  Negative for: breast discharge, breast lump(s),  breast pain and breast self exam.  Pertinent negatives include abnormal bleeding (hematology), anxiety, decreased libido, depression, difficulty falling sleep, dyspareunia, history of infertility, nocturia, sexual dysfunction, sleep disturbances, urinary incontinence, urinary urgency, vaginal discharge and vaginal itching. Diet regular. The patient states her exercise level is minimal, cardiac rehab two times a week for 8 weeks.      The patient's tobacco use is:  Social History   Tobacco Use  Smoking Status Former Smoker  . Packs/day: 0.25  . Years: 20.00  . Pack years: 5.00  . Types: Cigarettes  . Quit date: 01/31/2001  . Years since quitting: 18.4  Smokeless Tobacco Former User   She has been exposed to passive smoke. The patient's alcohol use is:  Social History   Substance and Sexual Activity  Alcohol Use Yes  . Alcohol/week: 2.0 standard drinks  . Types: 2 Shots of liquor per week   Additional information: Last pap unknown, next one scheduled for today.   Past Medical History:  Diagnosis Date  . Acute kidney injury (nontraumatic) ([place])   . Acute renal failure (ARF) ([place]) 06/08/2018  . Cancer [facility]) 2008   Colon   . Diabetes mellitus without complication ([place])   . Gout 09/08/2018  . Hypertension   . Macrocytic anemia      Family History  Problem Relation Age of Onset  . Hypertension Mother   . Diabetes Mother   . Cervical cancer Mother   . Heart attack Father   . Hypertension Sister   . Hypertension Brother   . Hypertension Sister   . Hypertension Sister   . Prostate cancer Brother   . HIV/AIDS Brother  Current Outpatient Medications:  .  allopurinol (ZYLOPRIM) 100 MG tablet, Take 100 mg by mouth daily., Disp: , Rfl:  .  amiodarone (PACERONE) 200 MG tablet, Take 200 mg by mouth daily., Disp: , Rfl:  .  amLODipine (NORVASC) 10 MG tablet, Take 1 tablet (10 mg total) by mouth daily., Disp: 90 tablet, Rfl: 3 .  atorvastatin (LIPITOR) 40 MG tablet, TAKE 1  TABLET BY MOUTH EVERY DAY, Disp: 90 tablet, Rfl: 0 .  blood glucose meter kit and supplies, Dispense based on patient and insurance preference. Use up to four times daily as directed. (FOR ICD-10 E10.9, E11.9)., Disp: 1 each, Rfl: 3 .  colchicine 0.6 MG tablet, Take 0.6 mg by mouth 2 (two) times daily as needed (for gout)., Disp: , Rfl:  .  doxazosin (CARDURA) 1 MG tablet, Take 1 tablet (1 mg total) by mouth at bedtime., Disp: 30 tablet, Rfl: 6 .  furosemide (LASIX) 40 MG tablet, TAKE 1 TABLET BY MOUTH EVERY DAY, Disp: 90 tablet, Rfl: 1 .  gabapentin (NEURONTIN) 300 MG capsule, , Disp: , Rfl:  .  glucose blood test strip, Use as instructed, Disp: 100 each, Rfl: 12 .  hydrALAZINE (APRESOLINE) 100 MG tablet, Take 1 tablet (100 mg total) by mouth 3 (three) times daily., Disp: 180 tablet, Rfl: 5 .  insulin glargine (LANTUS) 100 unit/mL SOPN, Inject 0.15 mLs (15 Units total) into the skin daily. (Patient taking differently: Inject 15 Units into the skin daily as needed (blood sugar over 150). ), Disp: , Rfl:  .  Insulin Pen Needle 29G X 5MM MISC, Use as directed, Disp: 200 each, Rfl: 0 .  latanoprost (XALATAN) 0.005 % ophthalmic solution, Place 1 drop into both eyes at bedtime. , Disp: , Rfl:  .  levothyroxine (SYNTHROID) 88 MCG tablet, Take 1 tablet (88 mcg total) by mouth daily., Disp: 30 tablet, Rfl: 2 .  metoprolol succinate (TOPROL-XL) 25 MG 24 hr tablet, Take 1 tablet (25 mg total) by mouth daily. STOP taking this unless heart rate >100bpm OR directed by your doctor, Disp: 30 tablet, Rfl: 5 .  Multiple Vitamins-Minerals (CENTRUM SILVER 50+WOMEN) TABS, Take 1 tablet by mouth daily., Disp: , Rfl:  .  sildenafil (REVATIO) 20 MG tablet, Take 1 tablet (20 mg total) by mouth 3 (three) times daily., Disp: 270 tablet, Rfl: 3 .  sitaGLIPtin (JANUVIA) 50 MG tablet, Take 1 tablet (50 mg total) by mouth daily., Disp: 90 tablet, Rfl: 0 No current facility-administered medications for this visit.    Facility-Administered Medications Ordered in Other Visits:  .  sodium chloride flush (NS) 0.9 % injection 10 mL, 10 mL, Intravenous, PRN, Alen Blew, Mathis Dad, MD   Allergies  Allergen Reactions  . Ancef [Cefazolin] Itching    Severe itching- after procedure, ancef was the antibiotic.-04/02/17  . Sulfa Antibiotics Rash and Other (See Comments)    Blisters, also     Review of Systems  Constitutional: Negative.  Negative for fatigue.  HENT: Negative.   Eyes: Negative.   Respiratory: Positive for cough (productive cough, history of smoking). Negative for wheezing.   Cardiovascular: Negative.  Negative for chest pain, palpitations and leg swelling.  Gastrointestinal: Negative.   Endocrine: Negative for polydipsia, polyphagia and polyuria.  Genitourinary: Negative.   Musculoskeletal: Negative.   Skin: Negative.   Neurological: Negative.  Negative for dizziness and headaches.  Psychiatric/Behavioral: Negative.      Today's Vitals   06/22/19 1015  BP: 130/70  Pulse: 68  Temp: 97.9 F (36.6  C)  TempSrc: Oral  Weight: 181 lb 12.8 oz (82.5 kg)  Height: 5' 4.2" (1.631 m)  PainSc: 0-No pain   Body mass index is 31.01 kg/m.   Objective:  Physical Exam Constitutional:      General: She is not in acute distress.    Appearance: Normal appearance. She is well-developed. She is obese.  HENT:     Head: Normocephalic and atraumatic.     Right Ear: Hearing, tympanic membrane, ear canal and external ear normal.     Left Ear: Hearing, tympanic membrane, ear canal and external ear normal.     Nose: Nose normal.     Mouth/Throat:     Mouth: Mucous membranes are moist.  Eyes:     General: Lids are normal.     Conjunctiva/sclera: Conjunctivae normal.     Pupils: Pupils are equal, round, and reactive to light.     Funduscopic exam:    Right eye: No papilledema.        Left eye: No papilledema.  Neck:     Musculoskeletal: Full passive range of motion without pain, normal range of motion  and neck supple.     Thyroid: No thyroid mass.     Vascular: No carotid bruit.  Cardiovascular:     Rate and Rhythm: Normal rate and regular rhythm.     Pulses: Normal pulses.     Heart sounds: Normal heart sounds. No murmur.  Pulmonary:     Effort: Pulmonary effort is normal.     Breath sounds: Normal breath sounds.  Abdominal:     General: Abdomen is flat. Bowel sounds are normal.     Palpations: Abdomen is soft.  Musculoskeletal: Normal range of motion.        General: No swelling.     Right lower leg: No edema.     Left lower leg: No edema.  Skin:    General: Skin is warm and dry.     Capillary Refill: Capillary refill takes less than 2 seconds.  Neurological:     General: No focal deficit present.     Mental Status: She is alert and oriented to person, place, and time.     Cranial Nerves: No cranial nerve deficit.     Sensory: No sensory deficit.  Psychiatric:        Mood and Affect: Mood normal.        Behavior: Behavior normal.        Thought Content: Thought content normal.        Judgment: Judgment normal.         Assessment And Plan:     1. Health maintenance examination . Behavior modifications discussed and diet history reviewed.   . Pt will continue to exercise regularly and modify diet with low GI, plant based foods and decrease intake of processed foods.  . Recommend intake of daily multivitamin, Vitamin D, and calcium.  . Recommend mammogram (sent her referral to [facility]) and colonoscopy for preventive screenings, as well as recommend immunizations that include influenza, TDAP (up to date)  2. Screening mammogram, encounter for  Pt instructed on Self Breast Exam.According to ACOG guidelines Women aged 38 and older are recommended to get an annual mammogram. Form completed and given to patient contact the [facility] for appointment scheduing.   Pt encouraged to get annual mammogram  3. Encounter for Papanicolaou smear of cervix  PAP done no  abnormal findings - Cytology -Pap Smear  4. Encounter for screening colonoscopy  According to USPTF Colorectal cancer Screening guidelines. Colonoscopy is recommended every 10 years, starting at age 44years.  Will refer to GI for colon cancer screening. - Ambulatory referral to Gastroenterology  5. Essential hypertension . B/P is controlled.  . CMP ordered to check renal function.  . The importance of regular exercise and dietary modification was stressed to the patient.  - CMP14+EGFR  6. Acquired hypothyroidism  Chronic, controlled  Continue with current medications - TSH - T4 - T3, free  7. Type 2 diabetes mellitus with hyperglycemia, without long-term current use of insulin (HCC) Chronic, controlled Continue with current medications Encouraged to limit intake of sugary foods and drinks Encouraged to increase physical activity to 150 minutes per week as tolerated - Hemoglobin A1c - POCT Urinalysis Dipstick (81002) - POCT UA - Microalbumin   [name], FNP    THE PATIENT IS ENCOURAGED TO PRACTICE SOCIAL DISTANCING DUE TO THE COVID-19 PANDEMIC.

## 2019-06-23 LAB — CMP14+EGFR
ALT: 14 IU/L (ref 0–32)
AST: 30 IU/L (ref 0–40)
Albumin/Globulin Ratio: 1.5 (ref 1.2–2.2)
Albumin: 4.5 g/dL (ref 3.8–4.8)
Alkaline Phosphatase: 121 IU/L — ABNORMAL HIGH (ref 39–117)
BUN/Creatinine Ratio: 26 (ref 12–28)
BUN: 64 mg/dL — ABNORMAL HIGH (ref 8–27)
Bilirubin Total: 0.7 mg/dL (ref 0.0–1.2)
CO2: 21 mmol/L (ref 20–29)
Calcium: 9.5 mg/dL (ref 8.7–10.3)
Chloride: 97 mmol/L (ref 96–106)
Creatinine, Ser: 2.45 mg/dL — ABNORMAL HIGH (ref 0.57–1.00)
GFR calc Af Amer: 23 mL/min/{1.73_m2} — ABNORMAL LOW (ref >59)
GFR calc non Af Amer: 20 mL/min/{1.73_m2} — ABNORMAL LOW (ref >59)
Globulin, Total: 3 g/dL (ref 1.5–4.5)
Glucose: 220 mg/dL — ABNORMAL HIGH (ref 65–99)
Potassium: 4.5 mmol/L (ref 3.5–5.2)
Sodium: 135 mmol/L (ref 134–144)
Total Protein: 7.5 g/dL (ref 6.0–8.5)

## 2019-06-23 LAB — T4: T4, Total: 11.5 ug/dL (ref 4.5–12.0)

## 2019-06-23 LAB — TSH: TSH: 6.81 u[IU]/mL — ABNORMAL HIGH (ref 0.450–4.500)

## 2019-06-23 LAB — HEMOGLOBIN A1C
Est. average glucose Bld gHb Est-mCnc: 126 mg/dL
Hgb A1c MFr Bld: 6 % — ABNORMAL HIGH (ref 4.8–5.6)

## 2019-06-23 LAB — T3, FREE: T3, Free: 1.5 pg/mL — ABNORMAL LOW (ref 2.0–4.4)

## 2019-06-24 ENCOUNTER — Other Ambulatory Visit: Payer: Self-pay

## 2019-06-24 ENCOUNTER — Encounter (HOSPITAL_COMMUNITY)
Admission: RE | Admit: 2019-06-24 | Discharge: 2019-06-24 | Disposition: A | Discharge status: Home / Self Care | Payer: BC Managed Care – PPO | Source: Ambulatory Visit | Attending: Internal Medicine | Admitting: Internal Medicine

## 2019-06-24 DIAGNOSIS — I272 Pulmonary hypertension, unspecified: Secondary | ICD-10-CM

## 2019-06-24 NOTE — Progress Notes (Signed)
Daily Session Note  Patient Details  Name: Esperansa Schiraldi MRN: [id number] Date of Birth: 02/28/1956 Referring Provider:     Pulmonary Rehab Walk Test [facility] 06/08/2019 in [facility]  Referring Provider  Dr. Bensimhon       Encounter Date: 06/24/2019  Check In: Session Check In - 06/24/19 1250      Check-In   Supervising physician immediately available to respond to emergencies  [facility] immediately available    Physician(s)  Dr. Swayze    Location  [facility]    Staff Present  Joan Behrens, RN, BSN;Dalton Fletcher, MS, Exercise Physiologist;Lisa Hughes, RN    Virtual Visit  No    Medication changes reported      No    Fall or balance concerns reported     No    Warm-up and Cool-down  Performed as group-led instruction    Resistance Training Performed  Yes    VAD Patient?  No    PAD/SET Patient?  No      Pain Assessment   Currently in Pain?  No/denies    Multiple Pain Sites  No       Capillary Blood Glucose: No results found for this or any previous visit (from the past 24 hour(s)).    Social History   Tobacco Use  Smoking Status Former Smoker  . Packs/day: 0.25  . Years: 20.00  . Pack years: 5.00  . Types: Cigarettes  . Quit date: 01/31/2001  . Years since quitting: 18.4  Smokeless Tobacco Former User    Goals Met:  Proper associated with RPD/PD & O2 Sat Exercise tolerated well Strength training completed today  Goals Unmet:  Not Applicable  Comments: Service time is from 1300 to 1407.    Dr. Wesam G. Yacoub is Medical Director for Pulmonary Rehab at [facility]. 

## 2019-06-25 LAB — CYTOLOGY - PAP
Comment: NEGATIVE
Diagnosis: NEGATIVE
High risk HPV: NEGATIVE

## 2019-06-29 ENCOUNTER — Encounter (HOSPITAL_COMMUNITY)
Admission: RE | Admit: 2019-06-29 | Discharge: 2019-06-29 | Disposition: A | Discharge status: Home / Self Care | Payer: BC Managed Care – PPO | Source: Ambulatory Visit | Attending: Internal Medicine | Admitting: Internal Medicine

## 2019-06-29 ENCOUNTER — Other Ambulatory Visit: Payer: Self-pay

## 2019-06-29 VITALS — Wt 185.0 lb

## 2019-06-29 DIAGNOSIS — I272 Pulmonary hypertension, unspecified: Secondary | ICD-10-CM | POA: Diagnosis not present

## 2019-06-29 NOTE — Progress Notes (Signed)
Patricia Mata 63 y.o. female Nutrition Note  Visit Diagnosis: Pulmonary hypertension [facility])   Past Medical History:  Diagnosis Date  . Acute kidney injury (nontraumatic) ([place])   . Acute renal failure (ARF) ([place]) 06/08/2018  . Cancer [facility]) 2008   Colon   . Diabetes mellitus without complication ([place])   . Gout 09/08/2018  . Hypertension   . Macrocytic anemia      Medications reviewed.   Current Outpatient Medications:  .  allopurinol (ZYLOPRIM) 100 MG tablet, Take 100 mg by mouth daily., Disp: , Rfl:  .  amiodarone (PACERONE) 200 MG tablet, Take 200 mg by mouth daily., Disp: , Rfl:  .  amLODipine (NORVASC) 10 MG tablet, Take 1 tablet (10 mg total) by mouth daily., Disp: 90 tablet, Rfl: 3 .  atorvastatin (LIPITOR) 40 MG tablet, TAKE 1 TABLET BY MOUTH EVERY DAY, Disp: 90 tablet, Rfl: 0 .  blood glucose meter kit and supplies, Dispense based on patient and insurance preference. Use up to four times daily as directed. (FOR ICD-10 E10.9, E11.9)., Disp: 1 each, Rfl: 3 .  colchicine 0.6 MG tablet, Take 0.6 mg by mouth 2 (two) times daily as needed (for gout)., Disp: , Rfl:  .  doxazosin (CARDURA) 1 MG tablet, Take 1 tablet (1 mg total) by mouth at bedtime., Disp: 30 tablet, Rfl: 6 .  furosemide (LASIX) 40 MG tablet, TAKE 1 TABLET BY MOUTH EVERY DAY, Disp: 90 tablet, Rfl: 1 .  gabapentin (NEURONTIN) 300 MG capsule, , Disp: , Rfl:  .  glucose blood test strip, Use as instructed, Disp: 100 each, Rfl: 12 .  hydrALAZINE (APRESOLINE) 100 MG tablet, Take 1 tablet (100 mg total) by mouth 3 (three) times daily., Disp: 180 tablet, Rfl: 5 .  insulin glargine (LANTUS) 100 unit/mL SOPN, Inject 0.15 mLs (15 Units total) into the skin daily. (Patient taking differently: Inject 15 Units into the skin daily as needed (blood sugar over 150). ), Disp: , Rfl:  .  Insulin Pen Needle 29G X 5MM MISC, Use as directed, Disp: 200 each, Rfl: 0 .  latanoprost (XALATAN) 0.005 % ophthalmic solution, Place 1 drop into both  eyes at bedtime. , Disp: , Rfl:  .  levothyroxine (SYNTHROID) 88 MCG tablet, Take 1 tablet (88 mcg total) by mouth daily., Disp: 30 tablet, Rfl: 2 .  metoprolol succinate (TOPROL-XL) 25 MG 24 hr tablet, Take 1 tablet (25 mg total) by mouth daily. STOP taking this unless heart rate >100bpm OR directed by your doctor, Disp: 30 tablet, Rfl: 5 .  Multiple Vitamins-Minerals (CENTRUM SILVER 50+WOMEN) TABS, Take 1 tablet by mouth daily., Disp: , Rfl:  .  sildenafil (REVATIO) 20 MG tablet, Take 1 tablet (20 mg total) by mouth 3 (three) times daily., Disp: 270 tablet, Rfl: 3 .  sitaGLIPtin (JANUVIA) 50 MG tablet, Take 1 tablet (50 mg total) by mouth daily., Disp: 90 tablet, Rfl: 0 No current facility-administered medications for this encounter.  Facility-Administered Medications Ordered in Other Encounters:  .  sodium chloride flush (NS) 0.9 % injection 10 mL, 10 mL, Intravenous, PRN, Alen Blew, Mathis Dad, MD   Ht Readings from Last 1 Encounters:  06/22/19 5' 4.2" (1.631 m)     Wt Readings from Last 3 Encounters:  06/22/19 181 lb 12.8 oz (82.5 kg)  06/08/19 181 lb 3.5 oz (82.2 kg)  05/25/19 180 lb 8 oz (81.9 kg)     There is no height or weight on file to calculate BMI.   Social History  Tobacco Use  Smoking Status Former Smoker  . Packs/day: 0.25  . Years: 20.00  . Pack years: 5.00  . Types: Cigarettes  . Quit date: 01/31/2001  . Years since quitting: 18.4  Smokeless Tobacco Former Building surveyor Value Date   CHOL 143 03/20/2019   Lab Results  Component Value Date   HDL 48 03/20/2019   Lab Results  Component Value Date   LDLCALC 86 03/20/2019   Lab Results  Component Value Date   TRIG 43 03/20/2019   Lab Results  Component Value Date   CHOLHDL 3.0 03/20/2019     Lab Results  Component Value Date   HGBA1C 6.0 (H) 06/22/2019     CBG (last 3)  No results for input(s): GLUCAP in the last 72 hours.   Nutrition Note  Reviewed nutrition pre-assessment  with pt.  Pt has Type 2 Diabetes. Last A1c indicates blood glucose well-controlled. This Probation officer went over Diabetes Education test results. Pt checks CBG's 1 times a day. Fasting CBG's reportedly 90-120 mg/dL.    Per discussion, pt does not use canned/convenience foods often. Pt does not add salt to food. Pt does not eat out frequently.   Pt expressed understanding of the information reviewed.    Nutrition Diagnosis ? Food-and nutrition-related knowledge deficit related to lack of exposure to information as related to diagnosis of: ? CVD ? Type 2 Diabetes and pulmonary hypertension  Nutrition Intervention ? Pt's individual nutrition plan reviewed with pt. ? Continue client-centered nutrition education by RD, as part of interdisciplinary care.  Goal(s)  ? Pt to build a healthy plate including vegetables, fruits, whole grains, and low-fat dairy products in a heart healthy meal plan. ? CBG concentrations in the normal range or as close to normal as is safely possible.  Plan:   Will provide client-centered nutrition education as part of interdisciplinary care  Monitor and evaluate progress toward nutrition goal with team.   Michaele Offer, MS, RDN, LDN

## 2019-06-29 NOTE — Patient Instructions (Signed)
Health Maintenance, Female Adopting a healthy lifestyle and getting preventive care are important in promoting health and wellness. Ask your health care provider about:  The right schedule for you to have regular tests and exams.  Things you can do on your own to prevent diseases and keep yourself healthy. What should I know about diet, weight, and exercise? Eat a healthy diet   Eat a diet that includes plenty of vegetables, fruits, low-fat dairy products, and lean protein.  Do not eat a lot of foods that are high in solid fats, added sugars, or sodium. Maintain a healthy weight Body mass index (BMI) is used to identify weight problems. It estimates body fat based on height and weight. Your health care provider can help determine your BMI and help you achieve or maintain a healthy weight. Get regular exercise Get regular exercise. This is one of the most important things you can do for your health. Most adults should:  Exercise for at least 150 minutes each week. The exercise should increase your heart rate and make you sweat (moderate-intensity exercise).  Do strengthening exercises at least twice a week. This is in addition to the moderate-intensity exercise.  Spend less time sitting. Even light physical activity can be beneficial. Watch cholesterol and blood lipids Have your blood tested for lipids and cholesterol at 64 years of age, then have this test every 5 years. Have your cholesterol levels checked more often if:  Your lipid or cholesterol levels are high.  You are older than 64 years of age.  You are at high risk for heart disease. What should I know about cancer screening? Depending on your health history and family history, you may need to have cancer screening at various ages. This may include screening for:  Breast cancer.  Cervical cancer.  Colorectal cancer.  Skin cancer.  Lung cancer. What should I know about heart disease, diabetes, and high blood  pressure? Blood pressure and heart disease  High blood pressure causes heart disease and increases the risk of stroke. This is more likely to develop in people who have high blood pressure readings, are of African descent, or are overweight.  Have your blood pressure checked: ? Every 3-5 years if you are 64-39 years of age. ? Every year if you are 64 years old or older. Diabetes Have regular diabetes screenings. This checks your fasting blood sugar level. Have the screening done:  Once every three years after age 64 if you are at a normal weight and have a low risk for diabetes.  More often and at a younger age if you are overweight or have a high risk for diabetes. What should I know about preventing infection? Hepatitis B If you have a higher risk for hepatitis B, you should be screened for this virus. Talk with your health care provider to find out if you are at risk for hepatitis B infection. Hepatitis C Testing is recommended for:  Everyone born from 1945 through 1965.  Anyone with known risk factors for hepatitis C. Sexually transmitted infections (STIs)  Get screened for STIs, including gonorrhea and chlamydia, if: ? You are sexually active and are younger than 64 years of age. ? You are older than 64 years of age and your health care provider tells you that you are at risk for this type of infection. ? Your sexual activity has changed since you were last screened, and you are at increased risk for chlamydia or gonorrhea. Ask your health care provider if   you are at risk.  Ask your health care provider about whether you are at high risk for HIV. Your health care provider may recommend a prescription medicine to help prevent HIV infection. If you choose to take medicine to prevent HIV, you should first get tested for HIV. You should then be tested every 3 months for as long as you are taking the medicine. Pregnancy  If you are about to stop having your period (premenopausal) and  you may become pregnant, seek counseling before you get pregnant.  Take 400 to 800 micrograms (mcg) of folic acid every day if you become pregnant.  Ask for birth control (contraception) if you want to prevent pregnancy. Osteoporosis and menopause Osteoporosis is a disease in which the bones lose minerals and strength with aging. This can result in bone fractures. If you are 64 years old or older, or if you are at risk for osteoporosis and fractures, ask your health care provider if you should:  Be screened for bone loss.  Take a calcium or vitamin D supplement to lower your risk of fractures.  Be given hormone replacement therapy (HRT) to treat symptoms of menopause. Follow these instructions at home: Lifestyle  Do not use any products that contain nicotine or tobacco, such as cigarettes, e-cigarettes, and chewing tobacco. If you need help quitting, ask your health care provider.  Do not use street drugs.  Do not share needles.  Ask your health care provider for help if you need support or information about quitting drugs. Alcohol use  Do not drink alcohol if: ? Your health care provider tells you not to drink. ? You are pregnant, may be pregnant, or are planning to become pregnant.  If you drink alcohol: ? Limit how much you use to 0-1 drink a day. ? Limit intake if you are breastfeeding.  Be aware of how much alcohol is in your drink. In the [place] one drink equals one 12 oz bottle of beer (355 mL), one 5 oz glass of wine (148 mL), or one 1 oz glass of hard liquor (44 mL). General instructions  Schedule regular health, dental, and eye exams.  Stay current with your vaccines.  Tell your health care provider if: ? You often feel depressed. ? You have ever been abused or do not feel safe at home. Summary  Adopting a healthy lifestyle and getting preventive care are important in promoting health and wellness.  Follow your health care provider's instructions about healthy  diet, exercising, and getting tested or screened for diseases.  Follow your health care provider's instructions on monitoring your cholesterol and blood pressure. This information is not intended to replace advice given to you by your health care provider. Make sure you discuss any questions you have with your health care provider. Document Released: 01/14/2011 Document Revised: 06/24/2018 Document Reviewed: 06/24/2018 Elsevier Patient Education  2020 [employer]  

## 2019-06-29 NOTE — Progress Notes (Signed)
Daily Session Note  Patient Details  Name: Patricia Mata MRN: [id number] Date of Birth: 1955/11/28 Referring Provider:     Pulmonary Rehab Walk Test from 06/08/2019 in [place]  Referring Provider  Dr. Haroldine Laws       Encounter Date: 06/29/2019  Check In: Session Check In - 06/29/19 1507      Check-In   Supervising physician immediately available to respond to emergencies  [facility] immediately available    Physician(s)  Dr. Earnest Conroy    Location  [facility]    Staff Present  Rosebud Poles, RN, Bjorn Loser, MS, Exercise Physiologist;Lisa Ysidro Evert, RN    Virtual Visit  No    Medication changes reported      No    Fall or balance concerns reported     No    Tobacco Cessation  No Change    Warm-up and Cool-down  Performed on first and last piece of equipment    Resistance Training Performed  Yes    VAD Patient?  No    PAD/SET Patient?  No      Pain Assessment   Currently in Pain?  No/denies    Multiple Pain Sites  No       Capillary Blood Glucose: No results found for this or any previous visit (from the past 24 hour(s)).  Exercise Prescription Changes - 06/29/19 1500      Response to Exercise   Blood Pressure (Admit)  160/60    Blood Pressure (Exercise)  130/60    Blood Pressure (Exit)  130/54    Heart Rate (Admit)  75 bpm    Heart Rate (Exercise)  68 bpm    Heart Rate (Exit)  63 bpm    Oxygen Saturation (Admit)  100 %    Oxygen Saturation (Exercise)  99 %    Oxygen Saturation (Exit)  100 %    Rating of Perceived Exertion (Exercise)  11    Perceived Dyspnea (Exercise)  0    Duration  Continue with 30 min of aerobic exercise without signs/symptoms of physical distress.    Intensity  THRR unchanged      Resistance Training   Training Prescription  Yes    Weight  orange bands    Reps  10-15    Time  10 Minutes      NuStep   Level  2    SPM  80    Minutes  30    METs  1.7       Social History    Tobacco Use  Smoking Status Former Smoker  . Packs/day: 0.25  . Years: 20.00  . Pack years: 5.00  . Types: Cigarettes  . Quit date: 01/31/2001  . Years since quitting: 18.4  Smokeless Tobacco Former Systems developer    Goals Met:  Independence with exercise equipment Exercise tolerated well Strength training completed today  Goals Unmet:  Not Applicable  Comments: Service time is from 1307 to 1415    Dr. Rush Farmer is Medical Director for Pulmonary Rehab at [facility].

## 2019-06-29 NOTE — Progress Notes (Addendum)
Pulmonary Individual Treatment Plan  Patient Details  Name: Patricia Mata MRN: [id number] Date of Birth: [birth year] Referring Provider:     Pulmonary Rehab Walk Test from 06/08/2019 in [place]  Referring Provider  Dr. Haroldine Laws       Initial Encounter Date:    Pulmonary Rehab Walk Test from 06/08/2019 in [place]  Date  06/08/19      Visit Diagnosis: Pulmonary hypertension ([place])  Patient's Home Medications on Admission:   Current Outpatient Medications:  .  allopurinol (ZYLOPRIM) 100 MG tablet, Take 100 mg by mouth daily., Disp: , Rfl:  .  amiodarone (PACERONE) 200 MG tablet, Take 200 mg by mouth daily., Disp: , Rfl:  .  amLODipine (NORVASC) 10 MG tablet, Take 1 tablet (10 mg total) by mouth daily., Disp: 90 tablet, Rfl: 3 .  atorvastatin (LIPITOR) 40 MG tablet, TAKE 1 TABLET BY MOUTH EVERY DAY, Disp: 90 tablet, Rfl: 0 .  blood glucose meter kit and supplies, Dispense based on patient and insurance preference. Use up to four times daily as directed. (FOR ICD-10 E10.9, E11.9)., Disp: 1 each, Rfl: 3 .  colchicine 0.6 MG tablet, Take 0.6 mg by mouth 2 (two) times daily as needed (for gout)., Disp: , Rfl:  .  doxazosin (CARDURA) 1 MG tablet, Take 1 tablet (1 mg total) by mouth at bedtime., Disp: 30 tablet, Rfl: 6 .  furosemide (LASIX) 40 MG tablet, TAKE 1 TABLET BY MOUTH EVERY DAY, Disp: 90 tablet, Rfl: 1 .  gabapentin (NEURONTIN) 300 MG capsule, , Disp: , Rfl:  .  glucose blood test strip, Use as instructed, Disp: 100 each, Rfl: 12 .  hydrALAZINE (APRESOLINE) 100 MG tablet, Take 1 tablet (100 mg total) by mouth 3 (three) times daily., Disp: 180 tablet, Rfl: 5 .  insulin glargine (LANTUS) 100 unit/mL SOPN, Inject 0.15 mLs (15 Units total) into the skin daily. (Patient taking differently: Inject 15 Units into the skin daily as needed (blood sugar over 150). ), Disp: , Rfl:  .  Insulin Pen Needle 29G X 5MM MISC, Use as  directed, Disp: 200 each, Rfl: 0 .  latanoprost (XALATAN) 0.005 % ophthalmic solution, Place 1 drop into both eyes at bedtime. , Disp: , Rfl:  .  levothyroxine (SYNTHROID) 88 MCG tablet, Take 1 tablet (88 mcg total) by mouth daily., Disp: 30 tablet, Rfl: 2 .  metoprolol succinate (TOPROL-XL) 25 MG 24 hr tablet, Take 1 tablet (25 mg total) by mouth daily. STOP taking this unless heart rate >100bpm OR directed by your doctor, Disp: 30 tablet, Rfl: 5 .  Multiple Vitamins-Minerals (CENTRUM SILVER 50+WOMEN) TABS, Take 1 tablet by mouth daily., Disp: , Rfl:  .  sildenafil (REVATIO) 20 MG tablet, Take 1 tablet (20 mg total) by mouth 3 (three) times daily., Disp: 270 tablet, Rfl: 3 .  sitaGLIPtin (JANUVIA) 50 MG tablet, Take 1 tablet (50 mg total) by mouth daily., Disp: 90 tablet, Rfl: 0 No current facility-administered medications for this encounter.  Facility-Administered Medications Ordered in Other Encounters:  .  sodium chloride flush (NS) 0.9 % injection 10 mL, 10 mL, Intravenous, PRN, Alen Blew, Mathis Dad, MD  Past Medical History: Past Medical History:  Diagnosis Date  . Acute kidney injury (nontraumatic) ([place])   . Acute renal failure (ARF) ([place]) 06/08/2018  . Cancer [facility]) 2008   Colon   . Diabetes mellitus without complication ([place])   . Gout 09/08/2018  . Hypertension   . Macrocytic anemia  Tobacco Use: Social History   Tobacco Use  Smoking Status Former Smoker  . Packs/day: 0.25  . Years: 20.00  . Pack years: 5.00  . Types: Cigarettes  . Quit date: 01/31/2001  . 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[employer] Cardiac and Pulmonary Rehab Latest Ref Rng & Units 04/19/2019 04/23/2019 04/23/2019 04/23/2019 06/22/2019   Cholestrol 0 - 200 mg/dL - - - - -   LDLCALC 0 - 99 mg/dL - - - - -   HDL >40 mg/dL - - - - -   Trlycerides <150 mg/dL - - - - -   Hemoglobin A1c 4.8 - 5.6 % 6.5(H) - - - 6.0(H)   PHART 7.350 - 7.450 -  - - - -   PCO2ART 32.0 - 48.0 mmHg - - - - -   HCO3 20.0 - 28.0 mmol/L - 22.7 23.8 22.9 -   TCO2 22 - 32 mmol/L - _0 -   ACIDBASEDEF 0.0 - 2.0 mmol/L - 2.0 1.0 2.0 -   O2SAT % - 73.0 75.0 77.0 -      Capillary Blood Glucose: Lab Results  Component Value Date   GLUCAP 159 (H) 04/23/2019   GLUCAP 218 (H) 03/23/2019   GLUCAP 365 (H) 03/23/2019   GLUCAP 396 (H) 03/22/2019   GLUCAP 242 (H) 03/22/2019     Pulmonary Assessment Scores: Pulmonary Assessment Scores    Row Name 06/08/19 1515 06/08/19 1551       ADL UCSD   ADL Phase  Entry  Entry    SOB Score total  12  --      CAT Score   CAT Score  10  --      mMRC Score   mMRC Score  --  3      UCSD: Self-administered rating of dyspnea associated with activities of daily living (ADLs) 6-point scale (0 = "not at all" to 5 = "maximal or unable to do because of breathlessness")  Scoring Scores range from 0 to 120.  Minimally important difference is 5 units  CAT: CAT can identify the health impairment of COPD patients and is better correlated with disease progression.  CAT has a scoring range of zero to 40. The CAT score is classified into four groups of low (less than 10), medium (10 - 20), high (21-30) and very high (31-40) based on the impact level of disease on health status. A CAT score over 10 suggests significant symptoms.  A worsening CAT score could be explained by an exacerbation, poor medication adherence, poor inhaler technique, or progression of COPD or comorbid conditions.  CAT MCID is 2 points  mMRC: mMRC (Modified Medical Research Council) Dyspnea Scale is used to assess the degree of baseline functional disability in patients of respiratory disease due to dyspnea. No minimal important difference is established. A decrease in score of 1 point or greater is considered a positive change.   Pulmonary Function Assessment: Pulmonary Function Assessment - 06/08/19 1515      Breath   Bilateral Breath Sounds   Clear    Shortness of Breath  No       Exercise Target Goals: Exercise Program Goal: Individual exercise prescription set using results from initial 6 min walk test and THRR while considering  patient's activity barriers and safety.   Exercise Prescription Goal: Initial exercise prescription builds to 30-45 minutes a day of aerobic activity, 2-3  days per week.  Home exercise guidelines will be given to patient during program as part of exercise prescription that the participant will acknowledge.  Activity Barriers & Risk Stratification: Activity Barriers & Cardiac Risk Stratification - 06/08/19 1504      Activity Barriers & Cardiac Risk Stratification   Activity Barriers  Arthritis;Deconditioning;Muscular Weakness       6 Minute Walk: 6 Minute Walk    Row Name 06/08/19 1552         6 Minute Walk   Phase  Initial     Distance  500 feet     Walk Time  4.08 minutes     # of Rest Breaks  3     MPH  0.95     METS  1.98     RPE  13     Perceived Dyspnea   0     VO2 Peak  6.93     Symptoms  Yes (comment)     Comments  3 seated rest breaks 30 sec, 30 sec, 55 sec all due to leg fatigue     Resting HR  71 bpm     Resting BP  144/56     Resting Oxygen Saturation   100 %     Exercise Oxygen Saturation  during 6 min walk  99 %     Max Ex. HR  100 bpm     Max Ex. BP  184/60     2 Minute Post BP  156/48       Interval HR   1 Minute HR  94     2 Minute HR  96     3 Minute HR  95     4 Minute HR  91     5 Minute HR  100     6 Minute HR  100     2 Minute Post HR  79     Interval Heart Rate?  Yes       Interval Oxygen   Interval Oxygen?  Yes     Baseline Oxygen Saturation %  100 %     1 Minute Oxygen Saturation %  99 %     1 Minute Liters of Oxygen  0 L     2 Minute Oxygen Saturation %  100 %     2 Minute Liters of Oxygen  0 L     3 Minute Oxygen Saturation %  99 %     3 Minute Liters of Oxygen  0 L     4 Minute Oxygen Saturation %  99 %     4 Minute Liters of Oxygen  0  L     5 Minute Oxygen Saturation %  99 %     5 Minute Liters of Oxygen  0 L     6 Minute Oxygen Saturation %  99 %     6 Minute Liters of Oxygen  0 L     2 Minute Post Oxygen Saturation %  100 %     2 Minute Post Liters of Oxygen  0 L        Oxygen Initial Assessment: Oxygen Initial Assessment - 06/08/19 1551      Home Oxygen   Home Oxygen Device  None    Sleep Oxygen Prescription  None    Home Exercise Oxygen Prescription  None    Home at Rest Exercise Oxygen Prescription  None    Compliance with Home Oxygen Use  Yes      Initial 6 min Walk   Oxygen Used  None      Program Oxygen Prescription   Program Oxygen Prescription  None      Intervention   Short Term Goals  To learn and exhibit compliance with exercise, home and travel O2 prescription;To learn and understand importance of monitoring SPO2 with pulse oximeter and demonstrate accurate use of the pulse oximeter.;To learn and understand importance of maintaining oxygen saturations>88%;To learn and demonstrate proper pursed lip breathing techniques or other breathing techniques.;To learn and demonstrate proper use of respiratory medications    Long  Term Goals  Exhibits compliance with exercise, home and travel O2 prescription;Maintenance of O2 saturations>88%;Verbalizes importance of monitoring SPO2 with pulse oximeter and return demonstration;Exhibits proper breathing techniques, such as pursed lip breathing or other method taught during program session;Compliance with respiratory medication       Oxygen Re-Evaluation: Oxygen Re-Evaluation    Row Name 06/28/19 1018             Program Oxygen Prescription   Program Oxygen Prescription  None         Home Oxygen   Home Oxygen Device  None       Sleep Oxygen Prescription  None       Home Exercise Oxygen Prescription  None       Home at Rest Exercise Oxygen Prescription  None       Compliance with Home Oxygen Use  Yes         Goals/Expected Outcomes   Short Term  Goals  To learn and exhibit compliance with exercise, home and travel O2 prescription;To learn and understand importance of monitoring SPO2 with pulse oximeter and demonstrate accurate use of the pulse oximeter.;To learn and understand importance of maintaining oxygen saturations>88%;To learn and demonstrate proper pursed lip breathing techniques or other breathing techniques.;To learn and demonstrate proper use of respiratory medications       Long  Term Goals  Exhibits compliance with exercise, home and travel O2 prescription;Maintenance of O2 saturations>88%;Verbalizes importance of monitoring SPO2 with pulse oximeter and return demonstration;Exhibits proper breathing techniques, such as pursed lip breathing or other method taught during program session;Compliance with respiratory medication       Goals/Expected Outcomes  compliance          Oxygen Discharge (Final Oxygen Re-Evaluation): Oxygen Re-Evaluation - 06/28/19 1018      Program Oxygen Prescription   Program Oxygen Prescription  None      Home Oxygen   Home Oxygen Device  None    Sleep Oxygen Prescription  None    Home Exercise Oxygen Prescription  None    Home at Rest Exercise Oxygen Prescription  None    Compliance with Home Oxygen Use  Yes      Goals/Expected Outcomes   Short Term Goals  To learn and exhibit compliance with exercise, home and travel O2 prescription;To learn and understand importance of monitoring SPO2 with pulse oximeter and demonstrate accurate use of the pulse oximeter.;To learn and understand importance of maintaining oxygen saturations>88%;To learn and demonstrate proper pursed lip breathing techniques or other breathing techniques.;To learn and demonstrate proper use of respiratory medications    Long  Term Goals  Exhibits compliance with exercise, home and travel O2 prescription;Maintenance of O2 saturations>88%;Verbalizes importance of monitoring SPO2 with pulse oximeter and return demonstration;Exhibits  proper breathing techniques, such as pursed lip breathing or other method taught during program session;Compliance with respiratory medication  Goals/Expected Outcomes  compliance       Initial Exercise Prescription: Initial Exercise Prescription - 06/08/19 1500      Date of Initial Exercise RX and Referring Provider   Date  06/08/19    Referring Provider  Dr. Haroldine Laws       NuStep   Level  2    SPM  80    Minutes  30      Prescription Details   Frequency (times per week)  2    Duration  Progress to 30 minutes of continuous aerobic without signs/symptoms of physical distress      Intensity   THRR 40-80% of Max Heartrate  62-125    Ratings of Perceived Exertion  11-13    Perceived Dyspnea  0-4      Progression   Progression  Continue progressive overload as per policy without signs/symptoms or physical distress.      Resistance Training   Training Prescription  Yes    Weight  orange bands    Reps  10-15       Perform Capillary Blood Glucose checks as needed.  Exercise Prescription Changes:   Exercise Comments:   Exercise Goals and Review: Exercise Goals    Row Name 06/08/19 1558 06/28/19 1018           Exercise Goals   Increase Physical Activity  Yes  Yes      Intervention  Provide advice, education, support and counseling about physical activity/exercise needs.;Develop an individualized exercise prescription for aerobic and resistive training based on initial evaluation findings, risk stratification, comorbidities and participant's personal goals.  Provide advice, education, support and counseling about physical activity/exercise needs.;Develop an individualized exercise prescription for aerobic and resistive training based on initial evaluation findings, risk stratification, comorbidities and participant's personal goals.      Expected Outcomes  Short Term: Attend rehab on a regular basis to increase amount of physical activity.;Long Term: Add in home  exercise to make exercise part of routine and to increase amount of physical activity.;Long Term: Exercising regularly at least 3-5 days a week.  Short Term: Attend rehab on a regular basis to increase amount of physical activity.;Long Term: Add in home exercise to make exercise part of routine and to increase amount of physical activity.;Long Term: Exercising regularly at least 3-5 days a week.      Increase Strength and Stamina  Yes  Yes      Intervention  Provide advice, education, support and counseling about physical activity/exercise needs.;Develop an individualized exercise prescription for aerobic and resistive training based on initial evaluation findings, risk stratification, comorbidities and participant's personal goals.  Provide advice, education, support and counseling about physical activity/exercise needs.;Develop an individualized exercise prescription for aerobic and resistive training based on initial evaluation findings, risk stratification, comorbidities and participant's personal goals.      Expected Outcomes  Short Term: Increase workloads from initial exercise prescription for resistance, speed, and METs.;Short Term: Perform resistance training exercises routinely during rehab and add in resistance training at home;Long Term: Improve cardiorespiratory fitness, muscular endurance and strength as measured by increased METs and functional capacity (6MWT)  Short Term: Increase workloads from initial exercise prescription for resistance, speed, and METs.;Short Term: Perform resistance training exercises routinely during rehab and add in resistance training at home;Long Term: Improve cardiorespiratory fitness, muscular endurance and strength as measured by increased METs and functional capacity (6MWT)      Able to understand and use rate of perceived exertion (RPE) scale  Yes  Yes      Intervention  Provide education and explanation on how to use RPE scale  Provide education and explanation on  how to use RPE scale      Expected Outcomes  Short Term: Able to use RPE daily in rehab to express subjective intensity level;Long Term:  Able to use RPE to guide intensity level when exercising independently  Short Term: Able to use RPE daily in rehab to express subjective intensity level;Long Term:  Able to use RPE to guide intensity level when exercising independently      Able to understand and use Dyspnea scale  Yes  Yes      Intervention  Provide education and explanation on how to use Dyspnea scale  Provide education and explanation on how to use Dyspnea scale      Expected Outcomes  Short Term: Able to use Dyspnea scale daily in rehab to express subjective sense of shortness of breath during exertion;Long Term: Able to use Dyspnea scale to guide intensity level when exercising independently  Short Term: Able to use Dyspnea scale daily in rehab to express subjective sense of shortness of breath during exertion;Long Term: Able to use Dyspnea scale to guide intensity level when exercising independently      Knowledge and understanding of Target Heart Rate Range (THRR)  Yes  Yes      Intervention  --  Provide education and explanation of THRR including how the numbers were predicted and where they are located for reference      Expected Outcomes  Short Term: Able to state/look up THRR;Short Term: Able to use daily as guideline for intensity in rehab;Long Term: Able to use THRR to govern intensity when exercising independently  Short Term: Able to state/look up THRR;Short Term: Able to use daily as guideline for intensity in rehab;Long Term: Able to use THRR to govern intensity when exercising independently      Understanding of Exercise Prescription  Yes  Yes      Intervention  Provide education, explanation, and written materials on patient's individual exercise prescription  Provide education, explanation, and written materials on patient's individual exercise prescription      Expected Outcomes   Short Term: Able to explain program exercise prescription;Long Term: Able to explain home exercise prescription to exercise independently  Short Term: Able to explain program exercise prescription;Long Term: Able to explain home exercise prescription to exercise independently         Exercise Goals Re-Evaluation : Exercise Goals Re-Evaluation    [place] Name 06/28/19 1019             Exercise Goal Re-Evaluation   Exercise Goals Review  Increase Physical Activity;Increase Strength and Stamina;Able to understand and use rate of perceived exertion (RPE) scale;Able to understand and use Dyspnea scale;Knowledge and understanding of Target Heart Rate Range (THRR);Understanding of Exercise Prescription       Comments  Pt has completed 2 exercise sessions. I will be working with pt to build up her leg strength and muscle stamina. Pt is currently limited by both. Pt shows a positive outlook and is eager to work hard. Pt currently exercises at 1.8 METs on the stepper. Will continue to monitor and progress as able.       Expected Outcomes  Through exercise at rehab and at home, the patient will decrease shortness of breath with daily activities and feel confident in carrying out an exercise regime at home.          Discharge  Exercise Prescription (Final Exercise Prescription Changes):   Nutrition:  Target Goals: Understanding of nutrition guidelines, daily intake of sodium <1586m, cholesterol <2020m calories 30% from fat and 7% or less from saturated fats, daily to have 5 or more servings of fruits and vegetables.  Biometrics: Pre Biometrics - 06/08/19 1505      Pre Biometrics   Height  _0  (1.626 m)    Weight  82.2 kg    BMI (Calculated)  31.09    Grip Strength  21 kg        Nutrition Therapy Plan and Nutrition Goals:   Nutrition Assessments:   Nutrition Goals Re-Evaluation:   Nutrition Goals Discharge (Final Nutrition Goals Re-Evaluation):   Psychosocial: Target Goals:  Acknowledge presence or absence of significant depression and/or stress, maximize coping skills, provide positive support system. Participant is able to verbalize types and ability to use techniques and skills needed for reducing stress and depression.  Initial Review & Psychosocial Screening: Initial Psych Review & Screening - 06/08/19 1517      Initial Review   Current issues with  None Identified      Family Dynamics   Good Support System?  Yes      Barriers   Psychosocial barriers to participate in program  There are no identifiable barriers or psychosocial needs.      Screening Interventions   Interventions  Encouraged to exercise       Quality of Life Scores:  Scores of 19 and below usually indicate a poorer quality of life in these areas.  A difference of  2-3 points is a clinically meaningful difference.  A difference of 2-3 points in the total score of the Quality of Life Index has been associated with significant improvement in overall quality of life, self-image, physical symptoms, and general health in studies assessing change in quality of life.  PHQ-9: Recent Review Flowsheet Data    [facility] 06/22/2019 06/08/2019 12/09/2018 09/09/2018   Decreased Interest 0 0 0 0   Down, Depressed, Hopeless 0 0 0 0   PHQ - 2 Score 0 0 0 0   Altered sleeping - 0 - -   Tired, decreased energy - 0 - -   Change in appetite - 0 - -   Feeling bad or failure about yourself  - 0 - -   Trouble concentrating - 0 - -   Moving slowly or fidgety/restless - 0 - -   Suicidal thoughts - 0 - -   PHQ-9 Score - 0 - -   Difficult doing work/chores - Not difficult at all - -     Interpretation of Total Score  Total Score Depression Severity:  1-4 = Minimal depression, 5-9 = Mild depression, 10-14 = Moderate depression, 15-19 = Moderately severe depression, 20-27 = Severe depression   Psychosocial Evaluation and Intervention: Psychosocial Evaluation - 06/08/19 1517       Psychosocial Evaluation & Interventions   Interventions  Encouraged to exercise with the program and follow exercise prescription    Continue Psychosocial Services   No Follow up required       Psychosocial Re-Evaluation: Psychosocial Re-Evaluation    Row Name 06/08/19 1518 06/28/19 1212           Psychosocial Re-Evaluation   Current issues with  None Identified  None Identified      Comments  --  No psychosocial concerns identified at this point, Patricia Mata started the program and has attended 2 exercise sessions.  Expected Outcomes  --  That Patricia Mata will experience no barriers or psychosocial concerns while in pulmonary rehab.      Interventions  Encouraged to attend Pulmonary Rehabilitation for the exercise  Encouraged to attend Pulmonary Rehabilitation for the exercise      Continue Psychosocial Services   No Follow up required  No Follow up required         Psychosocial Discharge (Final Psychosocial Re-Evaluation): Psychosocial Re-Evaluation - 06/28/19 1212      Psychosocial Re-Evaluation   Current issues with  None Identified    Comments  No psychosocial concerns identified at this point, Patricia Mata just started the program and has attended 2 exercise sessions.    Expected Outcomes  That Patricia Mata will experience no barriers or psychosocial concerns while in pulmonary rehab.    Interventions  Encouraged to attend Pulmonary Rehabilitation for the exercise    Continue Psychosocial Services   No Follow up required       Education: Education Goals: Education classes will be provided on a weekly basis, covering required topics. Participant will state understanding/return demonstration of topics presented.  Learning Barriers/Preferences: Learning Barriers/Preferences - 06/08/19 1518      Learning Barriers/Preferences   Learning Barriers  None    Learning Preferences  Audio;Computer/Internet;Group Instruction;Individual Instruction;Pictoral;Skilled Demonstration;Verbal  Instruction;Written Material;Video       Education Topics: Risk Factor Reduction:  -Group instruction that is supported by a PowerPoint presentation. Instructor discusses the definition of a risk factor, different risk factors for pulmonary disease, and how the heart and lungs work together.     Nutrition for Pulmonary Patient:  -Group instruction provided by PowerPoint slides, verbal discussion, and written materials to support subject matter. The instructor gives an explanation and review of healthy diet recommendations, which includes a discussion on weight management, recommendations for fruit and vegetable consumption, as well as protein, fluid, caffeine, fiber, sodium, sugar, and alcohol. Tips for eating when patients are short of breath are discussed.   Pursed Lip Breathing:  -Group instruction that is supported by demonstration and informational handouts. Instructor discusses the benefits of pursed lip and diaphragmatic breathing and detailed demonstration on how to preform both.     Oxygen Safety:  -Group instruction provided by PowerPoint, verbal discussion, and written material to support subject matter. There is an overview of "What is Oxygen" and "Why do we need it".  Instructor also reviews how to create a safe environment for oxygen use, the importance of using oxygen as prescribed, and the risks of noncompliance. There is a brief discussion on traveling with oxygen and resources the patient may utilize.   Oxygen Equipment:  -Group instruction provided by [facility] Staff utilizing handouts, written materials, and equipment demonstrations.   Signs and Symptoms:  -Group instruction provided by written material and verbal discussion to support subject matter. Warning signs and symptoms of infection, stroke, and heart attack are reviewed and when to call the physician/911 reinforced. Tips for preventing the spread of infection discussed.   Advanced Directives:  -Group  instruction provided by verbal instruction and written material to support subject matter. Instructor reviews Advanced Directive laws and proper instruction for filling out document.   Pulmonary Video:  -Group video education that reviews the importance of medication and oxygen compliance, exercise, good nutrition, pulmonary hygiene, and pursed lip and diaphragmatic breathing for the pulmonary patient.   Exercise for the Pulmonary Patient:  -Group instruction that is supported by a PowerPoint presentation. Instructor discusses benefits of exercise, core components of exercise,  frequency, duration, and intensity of an exercise routine, importance of utilizing pulse oximetry during exercise, safety while exercising, and options of places to exercise outside of rehab.     Pulmonary Medications:  -Verbally interactive group education provided by instructor with focus on inhaled medications and proper administration.   Anatomy and Physiology of the Respiratory System and Intimacy:  -Group instruction provided by PowerPoint, verbal discussion, and written material to support subject matter. Instructor reviews respiratory cycle and anatomical components of the respiratory system and their functions. Instructor also reviews differences in obstructive and restrictive respiratory diseases with examples of each. Intimacy, Sex, and Sexuality differences are reviewed with a discussion on how relationships can change when diagnosed with pulmonary disease. Common sexual concerns are reviewed.   MD DAY -A group question and answer session with a medical doctor that allows participants to ask questions that relate to their pulmonary disease state.   OTHER EDUCATION -Group or individual verbal, written, or video instructions that support the educational goals of the pulmonary rehab program.   Holiday Eating Survival Tips:  -Group instruction provided by PowerPoint slides, verbal discussion, and written  materials to support subject matter. The instructor gives patients tips, tricks, and techniques to help them not only survive but enjoy the holidays despite the onslaught of food that accompanies the holidays.   Knowledge Questionnaire Score: Knowledge Questionnaire Score - 06/08/19 1519      Knowledge Questionnaire Score   Pre Score  9/18       Core Components/Risk Factors/Patient Goals at Admission: Personal Goals and Risk Factors at Admission - 06/28/19 1213      Core Components/Risk Factors/Patient Goals on Admission   Improve shortness of breath with ADL's  Yes    Intervention  Provide education, individualized exercise plan and daily activity instruction to help decrease symptoms of SOB with activities of daily living.    Expected Outcomes  Short Term: Improve cardiorespiratory fitness to achieve a reduction of symptoms when performing ADLs;Long Term: Be able to perform more ADLs without symptoms or delay the onset of symptoms       Core Components/Risk Factors/Patient Goals Review:  Goals and Risk Factor Review    Row Name 06/28/19 1215             Core Components/Risk Factors/Patient Goals Review   Personal Goals Review  Develop more efficient breathing techniques such as purse lipped breathing and diaphragmatic breathing and practicing self-pacing with activity.;Increase knowledge of respiratory medications and ability to use respiratory devices properly.;Improve shortness of breath with ADL's       Review  Jeania just started the pulmonary rehab program, has attended 2 exercise sessions, too early to have met any program goals.       Expected Outcomes  See admission goals.          Core Components/Risk Factors/Patient Goals at Discharge (Final Review):  Goals and Risk Factor Review - 06/28/19 1215      Core Components/Risk Factors/Patient Goals Review   Personal Goals Review  Develop more efficient breathing techniques such as purse lipped breathing and diaphragmatic  breathing and practicing self-pacing with activity.;Increase knowledge of respiratory medications and ability to use respiratory devices properly.;Improve shortness of breath with ADL's    Review  Patricia Mata just started the pulmonary rehab program, has attended 2 exercise sessions, too early to have met any program goals.    Expected Outcomes  See admission goals.       ITP Comments:   Comments: ITP REVIEW  Pt is making expected progress toward pulmonary rehab goals after completing 2 sessions. Recommend continued exercise, life style modification, education, and utilization of breathing techniques to increase stamina and strength and decrease shortness of breath with exertion.

## 2019-07-01 ENCOUNTER — Encounter (HOSPITAL_COMMUNITY)
Admission: RE | Admit: 2019-07-01 | Discharge: 2019-07-01 | Disposition: A | Discharge status: Home / Self Care | Payer: BC Managed Care – PPO | Source: Ambulatory Visit | Attending: Internal Medicine | Admitting: Internal Medicine

## 2019-07-01 ENCOUNTER — Other Ambulatory Visit: Payer: Self-pay

## 2019-07-01 DIAGNOSIS — I272 Pulmonary hypertension, unspecified: Secondary | ICD-10-CM | POA: Diagnosis not present

## 2019-07-01 NOTE — Progress Notes (Signed)
Daily Session Note  Patient Details  Name: Jamillia Closson MRN: [id number] Date of Birth: [birth year] Referring Provider:     Pulmonary Rehab Walk Test from 06/08/2019 in [place]  Referring Provider  Dr. Haroldine Laws       Encounter Date: 07/01/2019  Check In: Session Check In - 07/01/19 1451      Check-In   Supervising physician immediately available to respond to emergencies  [facility] immediately available    Staff Present  Hoy Register, MS, Exercise Physiologist;Joan Leonia Reeves, RN, Roque Cash, RN    Virtual Visit  No    Medication changes reported      No    Fall or balance concerns reported     No    Tobacco Cessation  No Change    Warm-up and Cool-down  Performed on first and last piece of equipment    Resistance Training Performed  Yes    VAD Patient?  No    PAD/SET Patient?  No      Pain Assessment   Currently in Pain?  No/denies    Multiple Pain Sites  No       Capillary Blood Glucose: No results found for this or any previous visit (from the past 24 hour(s)).    Social History   Tobacco Use  Smoking Status Former Smoker  . Packs/day: 0.25  . Years: 20.00  . Pack years: 5.00  . Types: Cigarettes  . Quit date: 01/31/2001  . Years since quitting: 18.4  Smokeless Tobacco Former Systems developer    Goals Met:  Exercise tolerated well No report of cardiac concerns or symptoms Strength training completed today  Goals Unmet:  Not Applicable  Comments: Service time is from 1300 to 1410     Dr. Rush Farmer is Medical Director for Pulmonary Rehab at [facility].

## 2019-07-06 ENCOUNTER — Other Ambulatory Visit: Payer: Self-pay

## 2019-07-06 ENCOUNTER — Other Ambulatory Visit: Payer: BC Managed Care – PPO

## 2019-07-06 ENCOUNTER — Inpatient Hospital Stay: Payer: BC Managed Care – PPO

## 2019-07-06 ENCOUNTER — Encounter (HOSPITAL_COMMUNITY)
Admission: RE | Admit: 2019-07-06 | Discharge: 2019-07-06 | Disposition: A | Discharge status: Home / Self Care | Payer: BC Managed Care – PPO | Source: Ambulatory Visit | Attending: Internal Medicine | Admitting: Internal Medicine

## 2019-07-06 VITALS — Wt 181.9 lb

## 2019-07-06 DIAGNOSIS — D469 Myelodysplastic syndrome, unspecified: Secondary | ICD-10-CM

## 2019-07-06 DIAGNOSIS — I272 Pulmonary hypertension, unspecified: Secondary | ICD-10-CM | POA: Diagnosis not present

## 2019-07-06 DIAGNOSIS — N189 Chronic kidney disease, unspecified: Secondary | ICD-10-CM

## 2019-07-06 DIAGNOSIS — D631 Anemia in chronic kidney disease: Secondary | ICD-10-CM

## 2019-07-06 LAB — CMP (CANCER CENTER ONLY)
ALT: 12 U/L (ref 0–44)
AST: 25 U/L (ref 15–41)
Albumin: 3.9 g/dL (ref 3.5–5.0)
Alkaline Phosphatase: 92 U/L (ref 38–126)
Anion gap: 12 (ref 5–15)
BUN: 58 mg/dL — ABNORMAL HIGH (ref 8–23)
CO2: 24 mmol/L (ref 22–32)
Calcium: 9.1 mg/dL (ref 8.9–10.3)
Chloride: 102 mmol/L (ref 98–111)
Creatinine: 2.61 mg/dL — ABNORMAL HIGH (ref 0.44–1.00)
GFR, Est AFR Am: 22 mL/min — ABNORMAL LOW (ref >60)
GFR, Estimated: 19 mL/min — ABNORMAL LOW (ref >60)
Glucose, Bld: 241 mg/dL — ABNORMAL HIGH (ref 70–99)
Potassium: 4.3 mmol/L (ref 3.5–5.1)
Sodium: 138 mmol/L (ref 135–145)
Total Bilirubin: 0.5 mg/dL (ref 0.3–1.2)
Total Protein: 7.1 g/dL (ref 6.5–8.1)

## 2019-07-06 LAB — CBC WITH DIFFERENTIAL (CANCER CENTER ONLY)
Abs Immature Granulocytes: 0.05 10*3/uL (ref 0.00–0.07)
Basophils Absolute: 0 10*3/uL (ref 0.0–0.1)
Basophils Relative: 0 %
Eosinophils Absolute: 0 10*3/uL (ref 0.0–0.5)
Eosinophils Relative: 1 %
HCT: 25.1 % — ABNORMAL LOW (ref 36.0–46.0)
Hemoglobin: 8 g/dL — ABNORMAL LOW (ref 12.0–15.0)
Immature Granulocytes: 1 %
Lymphocytes Relative: 11 %
Lymphs Abs: 0.7 10*3/uL (ref 0.7–4.0)
MCH: 28.3 pg (ref 26.0–34.0)
MCHC: 31.9 g/dL (ref 30.0–36.0)
MCV: 88.7 fL (ref 80.0–100.0)
Monocytes Absolute: 0.7 10*3/uL (ref 0.1–1.0)
Monocytes Relative: 12 %
Neutro Abs: 4.4 10*3/uL (ref 1.7–7.7)
Neutrophils Relative %: 75 %
Platelet Count: 201 10*3/uL (ref 150–400)
RBC: 2.83 MIL/uL — ABNORMAL LOW (ref 3.87–5.11)
RDW: 21.4 % — ABNORMAL HIGH (ref 11.5–15.5)
WBC Count: 5.8 10*3/uL (ref 4.0–10.5)
nRBC: 0 % (ref 0.0–0.2)

## 2019-07-06 LAB — PREPARE RBC (CROSSMATCH)

## 2019-07-06 LAB — SAMPLE TO BLOOD BANK

## 2019-07-06 NOTE — Progress Notes (Signed)
Daily Session Note  Patient Details  Name: Patricia Mata MRN: [id number] Date of Birth: 15-May-1956 Referring Provider:     Pulmonary Rehab Walk Test from 06/08/2019 in [place]  Referring Provider  Dr. Haroldine Laws       Encounter Date: 07/06/2019  Check In: Session Check In - 07/06/19 1357      Check-In   Supervising physician immediately available to respond to emergencies  [facility] immediately available    Physician(s)  Dr. Nevada Crane    Location  [facility]    Staff Present  Rosebud Poles, RN, Bjorn Loser, MS, Exercise Physiologist;Lisa Ysidro Evert, RN    Virtual Visit  No    Medication changes reported      No    Fall or balance concerns reported     No    Tobacco Cessation  No Change    Warm-up and Cool-down  Performed on first and last piece of equipment    Resistance Training Performed  Yes    VAD Patient?  No    PAD/SET Patient?  No      Pain Assessment   Currently in Pain?  No/denies    Multiple Pain Sites  No       Capillary Blood Glucose: No results found for this or any previous visit (from the past 24 hour(s)).    Social History   Tobacco Use  Smoking Status Former Smoker  . Packs/day: 0.25  . Years: 20.00  . Pack years: 5.00  . Types: Cigarettes  . Quit date: 01/31/2001  . Years since quitting: 18.4  Smokeless Tobacco Former Systems developer    Goals Met:  Independence with exercise equipment Exercise tolerated well Strength training completed today  Goals Unmet:  Not Applicable  Comments: Service time is from 1300 to 1406    Dr. Rush Farmer is Medical Director for Pulmonary Rehab at [facility].

## 2019-07-06 NOTE — Progress Notes (Signed)
Dr. Alen Blew made aware of patient's hemoglobin level of 8.0. Per Dr. Alen Blew orders placed for one unit of blood.

## 2019-07-06 NOTE — Progress Notes (Signed)
Spoke to Patricia Mata in the blood bank to confirm orders received for one unit of blood for administration tomorrow. Patient is aware that she will receive one unit of blood tomorrow.

## 2019-07-07 ENCOUNTER — Inpatient Hospital Stay: Payer: BC Managed Care – PPO

## 2019-07-07 VITALS — BP 156/46 | HR 54 | Temp 98.4°F | Resp 17

## 2019-07-07 DIAGNOSIS — D469 Myelodysplastic syndrome, unspecified: Secondary | ICD-10-CM | POA: Diagnosis not present

## 2019-07-07 MED ORDER — DIPHENHYDRAMINE HCL 25 MG PO CAPS
25.0000 mg | ORAL_CAPSULE | Freq: Once | ORAL | Status: AC
  Administered 2019-07-07: 25 mg via ORAL

## 2019-07-07 MED ORDER — ACETAMINOPHEN 325 MG PO TABS
650.0000 mg | ORAL_TABLET | Freq: Once | ORAL | Status: AC
  Administered 2019-07-07: 650 mg via ORAL

## 2019-07-07 MED ORDER — FUROSEMIDE 10 MG/ML IJ SOLN
INTRAMUSCULAR | Status: AC
  Filled 2019-07-07: qty 2

## 2019-07-07 MED ORDER — ACETAMINOPHEN 325 MG PO TABS
ORAL_TABLET | ORAL | Status: AC
  Filled 2019-07-07: qty 2

## 2019-07-07 MED ORDER — FUROSEMIDE 10 MG/ML IJ SOLN
20.0000 mg | Freq: Once | INTRAMUSCULAR | Status: AC
  Administered 2019-07-07: 20 mg via INTRAVENOUS

## 2019-07-07 MED ORDER — LUSPATERCEPT-AAMT 75 MG ~~LOC~~ SOLR
75.0000 mg | Freq: Once | SUBCUTANEOUS | Status: AC
  Administered 2019-07-07: 75 mg via SUBCUTANEOUS
  Filled 2019-07-07: qty 1.5

## 2019-07-07 MED ORDER — SODIUM CHLORIDE 0.9% IV SOLUTION
250.0000 mL | Freq: Once | INTRAVENOUS | Status: AC
  Administered 2019-07-07: 250 mL via INTRAVENOUS
  Filled 2019-07-07: qty 250

## 2019-07-07 MED ORDER — DIPHENHYDRAMINE HCL 25 MG PO CAPS
ORAL_CAPSULE | ORAL | Status: AC
  Filled 2019-07-07: qty 1

## 2019-07-07 NOTE — Progress Notes (Signed)
Per dr. Elayne Snare for reblozyl today with creatinine level

## 2019-07-07 NOTE — Patient Instructions (Signed)
Blood Transfusion, Adult, Care After This sheet gives you information about how to care for yourself after your procedure. Your doctor may also give you more specific instructions. If you have problems or questions, contact your doctor. Follow these instructions at home:   Take over-the-counter and prescription medicines only as told by your doctor.  Go back to your normal activities as told by your doctor.  Follow instructions from your doctor about how to take care of the area where an IV tube was put into your vein (insertion site). Make sure you: ? Wash your hands with soap and water before you change your bandage (dressing). If there is no soap and water, use hand sanitizer. ? Change your bandage as told by your doctor.  Check your IV insertion site every day for signs of infection. Check for: ? More redness, swelling, or pain. ? More fluid or blood. ? Warmth. ? Pus or a bad smell. Contact a doctor if:  You have more redness, swelling, or pain around the IV insertion site.  You have more fluid or blood coming from the IV insertion site.  Your IV insertion site feels warm to the touch.  You have pus or a bad smell coming from the IV insertion site.  Your pee (urine) turns pink, red, or brown.  You feel weak after doing your normal activities. Get help right away if:  You have signs of a serious allergic or body defense (immune) system reaction, including: ? Itchiness. ? Hives. ? Trouble breathing. ? Anxiety. ? Pain in your chest or lower back. ? Fever, flushing, and chills. ? Fast pulse. ? Rash. ? Watery poop (diarrhea). ? Throwing up (vomiting). ? Dark pee. ? Serious headache. ? Dizziness. ? Stiff neck. ? Yellow color in your face or the white parts of your eyes (jaundice). Summary  After a blood transfusion, return to your normal activities as told by your doctor.  Every day, check for signs of infection where the IV tube was put into your vein.  Some  signs of infection are warm skin, more redness and pain, more fluid or blood, and pus or a bad smell where the needle went in.  Contact your doctor if you feel weak or have any unusual symptoms. This information is not intended to replace advice given to you by your health care provider. Make sure you discuss any questions you have with your health care provider. Document Released: 07/22/2014 Document Revised: 11/05/2017 Document Reviewed: 02/23/2016 Elsevier Patient Education  2020 Elsevier Inc.  Coronavirus (COVID-19) Are you at risk?  Are you at risk for the Coronavirus (COVID-19)?  To be considered HIGH RISK for Coronavirus (COVID-19), you have to meet the following criteria:  . Traveled to [place], [place], [place], [place] or [place]; or in the [place] to [place], [place], [place], or [place]; and have fever, cough, and shortness of breath within the last 2 weeks of travel OR . Been in close contact with a person diagnosed with COVID-19 within the last 2 weeks and have fever, cough, and shortness of breath . IF YOU DO NOT MEET THESE CRITERIA, YOU ARE CONSIDERED LOW RISK FOR COVID-19.  What to do if you are HIGH RISK for COVID-19?  . If you are having a medical emergency, call 911. . Seek medical care right away. Before you go to a doctor's office, urgent care or emergency department, call ahead and tell them about your recent travel, contact with someone diagnosed with COVID-19, and   your symptoms. You should receive instructions from your physician's office regarding next steps of care.  . When you arrive at healthcare provider, tell the healthcare staff immediately you have returned from visiting [place], [place], [place], [place] or [place]; or traveled in the [place] to [place], [place], [place], or [place]; in the last two weeks or you have been in close contact with a person diagnosed with COVID-19 in the last 2 weeks.   . Tell the health care staff about  your symptoms: fever, cough and shortness of breath. . After you have been seen by a medical provider, you will be either: o Tested for (COVID-19) and discharged home on quarantine except to seek medical care if symptoms worsen, and asked to  - Stay home and avoid contact with others until you get your results (4-5 days)  - Avoid travel on public transportation if possible (such as bus, train, or airplane) or o Sent to the Emergency Department by EMS for evaluation, COVID-19 testing, and possible admission depending on your condition and test results.  What to do if you are LOW RISK for COVID-19?  Reduce your risk of any infection by using the same precautions used for avoiding the common cold or flu:  . Wash your hands often with soap and warm water for at least 20 seconds.  If soap and water are not readily available, use an alcohol-based hand sanitizer with at least 60% alcohol.  . If coughing or sneezing, cover your mouth and nose by coughing or sneezing into the elbow areas of your shirt or coat, into a tissue or into your sleeve (not your hands). . Avoid shaking hands with others and consider head nods or verbal greetings only. . Avoid touching your eyes, nose, or mouth with unwashed hands.  . Avoid close contact with people who are sick. . Avoid places or events with large numbers of people in one location, like concerts or sporting events. . Carefully consider travel plans you have or are making. . If you are planning any travel outside or inside the [place], visit the CDC's Travelers' Health webpage for the latest health notices. . If you have some symptoms but not all symptoms, continue to monitor at home and seek medical attention if your symptoms worsen. . If you are having a medical emergency, call 911.   ADDITIONAL HEALTHCARE OPTIONS FOR PATIENTS  [facility] / e-Visit: [email or web address]         [facility]:  [phone]  [facility]: [phone]                   [facility]: [phone]   

## 2019-07-08 ENCOUNTER — Encounter (HOSPITAL_COMMUNITY): Payer: BC Managed Care – PPO

## 2019-07-08 LAB — TYPE AND SCREEN
ABO/RH(D): A NEG
Antibody Screen: NEGATIVE
Unit division: 0

## 2019-07-08 LAB — BPAM RBC
Blood Product Expiration Date: 202101102359
ISSUE DATE / TIME: 202012231000
Unit Type and Rh: 600

## 2019-07-12 ENCOUNTER — Other Ambulatory Visit: Payer: Self-pay | Admitting: Nurse Practitioner

## 2019-07-13 ENCOUNTER — Encounter (HOSPITAL_COMMUNITY): Payer: BC Managed Care – PPO

## 2019-07-15 ENCOUNTER — Other Ambulatory Visit: Payer: Self-pay

## 2019-07-15 ENCOUNTER — Encounter (HOSPITAL_COMMUNITY): Payer: BC Managed Care – PPO

## 2019-07-15 MED ORDER — ONETOUCH ULTRASOFT LANCETS MISC: 12 refills | Status: DC

## 2019-07-15 MED ORDER — ONETOUCH ULTRALINK W/DEVICE KIT: PACK | 0 refills | Status: DC

## 2019-07-15 MED ORDER — LEVOTHYROXINE SODIUM 88 MCG PO TABS: 88.0000 ug | ORAL_TABLET | Freq: Every day | ORAL | 2 refills | Status: DC

## 2019-07-20 ENCOUNTER — Encounter (HOSPITAL_COMMUNITY): Payer: BC Managed Care – PPO

## 2019-07-22 ENCOUNTER — Other Ambulatory Visit: Payer: Self-pay

## 2019-07-22 ENCOUNTER — Encounter (HOSPITAL_COMMUNITY): Payer: BC Managed Care – PPO

## 2019-07-22 MED ORDER — GLUCOSE BLOOD VI STRP: ORAL_STRIP | 12 refills | Status: DC

## 2019-07-26 ENCOUNTER — Telehealth: Payer: Self-pay

## 2019-07-26 ENCOUNTER — Other Ambulatory Visit: Payer: Self-pay | Admitting: Emergency Medicine

## 2019-07-26 DIAGNOSIS — D509 Iron deficiency anemia, unspecified: Secondary | ICD-10-CM

## 2019-07-26 DIAGNOSIS — D631 Anemia in chronic kidney disease: Secondary | ICD-10-CM

## 2019-07-26 DIAGNOSIS — N189 Chronic kidney disease, unspecified: Secondary | ICD-10-CM

## 2019-07-26 DIAGNOSIS — D469 Myelodysplastic syndrome, unspecified: Secondary | ICD-10-CM

## 2019-07-26 NOTE — Telephone Encounter (Signed)
Per YR: call Teandra Schmelzle to see why she is checking her blood sugars so much?  Per pt she was checking it 3x's a day she will start checing it 2x's a day

## 2019-07-27 ENCOUNTER — Inpatient Hospital Stay: Payer: Self-pay

## 2019-07-27 ENCOUNTER — Inpatient Hospital Stay: Payer: BC Managed Care – PPO

## 2019-07-27 ENCOUNTER — Telehealth: Payer: Self-pay

## 2019-07-27 ENCOUNTER — Other Ambulatory Visit: Payer: Self-pay

## 2019-07-27 ENCOUNTER — Inpatient Hospital Stay: Payer: BC Managed Care – PPO | Attending: Oncology | Admitting: Oncology

## 2019-07-27 ENCOUNTER — Encounter (HOSPITAL_COMMUNITY): Payer: BC Managed Care – PPO

## 2019-07-27 VITALS — BP 161/57 | HR 67 | Temp 98.3°F | Resp 17 | Ht 64.2 in | Wt 183.5 lb

## 2019-07-27 DIAGNOSIS — D509 Iron deficiency anemia, unspecified: Secondary | ICD-10-CM

## 2019-07-27 DIAGNOSIS — D469 Myelodysplastic syndrome, unspecified: Secondary | ICD-10-CM

## 2019-07-27 DIAGNOSIS — D464 Refractory anemia, unspecified: Secondary | ICD-10-CM | POA: Insufficient documentation

## 2019-07-27 LAB — CMP (CANCER CENTER ONLY)
ALT: 23 U/L (ref 0–44)
AST: 41 U/L (ref 15–41)
Albumin: 3.6 g/dL (ref 3.5–5.0)
Alkaline Phosphatase: 120 U/L (ref 38–126)
Anion gap: 12 (ref 5–15)
BUN: 66 mg/dL — ABNORMAL HIGH (ref 8–23)
CO2: 21 mmol/L — ABNORMAL LOW (ref 22–32)
Calcium: 8.8 mg/dL — ABNORMAL LOW (ref 8.9–10.3)
Chloride: 104 mmol/L (ref 98–111)
Creatinine: 2.53 mg/dL — ABNORMAL HIGH (ref 0.44–1.00)
GFR, Est AFR Am: 22 mL/min — ABNORMAL LOW (ref >60)
GFR, Estimated: 19 mL/min — ABNORMAL LOW (ref >60)
Glucose, Bld: 156 mg/dL — ABNORMAL HIGH (ref 70–99)
Potassium: 4.2 mmol/L (ref 3.5–5.1)
Sodium: 137 mmol/L (ref 135–145)
Total Bilirubin: 0.5 mg/dL (ref 0.3–1.2)
Total Protein: 7.2 g/dL (ref 6.5–8.1)

## 2019-07-27 LAB — CBC WITH DIFFERENTIAL (CANCER CENTER ONLY)
Abs Immature Granulocytes: 0.04 10*3/uL (ref 0.00–0.07)
Basophils Absolute: 0 10*3/uL (ref 0.0–0.1)
Basophils Relative: 0 %
Eosinophils Absolute: 0.1 10*3/uL (ref 0.0–0.5)
Eosinophils Relative: 1 %
HCT: 25.7 % — ABNORMAL LOW (ref 36.0–46.0)
Hemoglobin: 8.5 g/dL — ABNORMAL LOW (ref 12.0–15.0)
Immature Granulocytes: 1 %
Lymphocytes Relative: 9 %
Lymphs Abs: 0.6 10*3/uL — ABNORMAL LOW (ref 0.7–4.0)
MCH: 28.6 pg (ref 26.0–34.0)
MCHC: 33.1 g/dL (ref 30.0–36.0)
MCV: 86.5 fL (ref 80.0–100.0)
Monocytes Absolute: 1 10*3/uL (ref 0.1–1.0)
Monocytes Relative: 15 %
Neutro Abs: 4.7 10*3/uL (ref 1.7–7.7)
Neutrophils Relative %: 74 %
Platelet Count: 171 10*3/uL (ref 150–400)
RBC: 2.97 MIL/uL — ABNORMAL LOW (ref 3.87–5.11)
RDW: 20.8 % — ABNORMAL HIGH (ref 11.5–15.5)
WBC Count: 6.3 10*3/uL (ref 4.0–10.5)
nRBC: 0.3 % — ABNORMAL HIGH (ref 0.0–0.2)

## 2019-07-27 LAB — SAMPLE TO BLOOD BANK

## 2019-07-27 MED ORDER — LUSPATERCEPT-AAMT 75 MG ~~LOC~~ SOLR
75.0000 mg | Freq: Once | SUBCUTANEOUS | Status: AC
  Administered 2019-07-27: 75 mg via SUBCUTANEOUS
  Filled 2019-07-27: qty 1.5

## 2019-07-27 NOTE — Telephone Encounter (Signed)
Received call from [place] that patient will not receive blood product today per Dr. Alen Blew.

## 2019-07-27 NOTE — Patient Instructions (Signed)
Luspatercept injection What is this medicine? LUSPATERCEPT (lus PAT er sept) helps your body make more red blood cells. This medicine is used to treat anemia caused by beta thalassemia or myelodysplastic syndromes. This medicine may be used for other purposes; ask your health care provider or pharmacist if you have questions. COMMON BRAND NAME(S): REBLOZYL What should I tell my health care provider before I take this medicine? They need to know if you have any of these conditions:  cigarette smoker  have had your spleen removed  high blood pressure  history of blood clots  an unusual or allergic reaction to luspatercept, other medicines, foods, dyes or preservatives  pregnant or trying to get pregnant  breast-feeding How should I use this medicine? This medicine is for injection under the skin. It is given by a healthcare professional in a hospital or clinic setting. Talk to your pediatrician about the use of the medicine in children. This medicine is not approved for use in children. Overdosage: If you think you have taken too much of this medicine contact a poison control center or emergency room at once. NOTE: This medicine is only for you. Do not share this medicine with others. What if I miss a dose? Keep appointments for follow-up doses. It is important not to miss your dose. Call your doctor or healthcare professional if you are unable to keep an appointment. What may interact with this medicine? Interactions are not expected. This list may not describe all possible interactions. Give your health care provider a list of all the medicines, herbs, non-prescription drugs, or dietary supplements you use. Also tell them if you smoke, drink alcohol, or use illegal drugs. Some items may interact with your medicine. What should I watch for while using this medicine? Your condition will be monitored carefully while you are receiving this medicine. Do not become pregnant while taking  this medicine or for 3 months after stopping it. Women should inform their healthcare professional if they wish to become pregnant or think they might be pregnant. There is a potential for serious side effects and harm to an unborn child. Talk to your healthcare professional for more information. Do not breast-feed an infant while taking this medicine. You may need blood work done while you are taking this medicine. What side effects may I notice from receiving this medicine? Side effects that you should report to your doctor or health care professional as soon as possible:  allergic reactions like skin rash, itching or hives; swelling of the face, lips, or tongue  signs and symptoms of a blood clot such as chest pain; shortness of breath; pain, swelling, or warmth in the leg  signs and symptoms of a stroke like changes in vision; confusion; trouble speaking or understanding; severe headaches; sudden numbness or weakness of the face, arm or leg; trouble walking; dizziness; loss of balance or coordination Side effects that usually do not require medical attention (report these to your doctor or health care professional if they continue or are bothersome):  cough  diarrhea  dizziness  headache  joint pain  stomach pain  tiredness This list may not describe all possible side effects. Call your doctor for medical advice about side effects. You may report side effects to FDA at 1-800-FDA-1088. Where should I keep my medicine? This medicine is given in a hospital or clinic and will not be stored at home. NOTE: This sheet is a summary. It may not cover all possible information. If you have questions about   this medicine, talk to your doctor, pharmacist, or health care provider.  2020 Elsevier/Gold Standard (2018-10-20 15:57:59)  

## 2019-07-27 NOTE — Progress Notes (Signed)
Hematology and Oncology Follow Up Visit  Patricia Mata [id number] [birth year] 64 y.o. 07/27/2019 8:39 AM   Principle Diagnosis: 64 year old woman with  Myelodysplastic syndrome (MDS) in 2017 after presenting with IPSS-R score of 2.5 diagnosed   Secondary diagnosis: Stage III colon cancer diagnosed in 2008.  She has no evidence of relapse and presumably cured at this time.    Prior Therapy: She is S/P hemicolectomy done in 11/2006. 1/13 lymph nodes are positive.  This was followed by 12 cycles of FOLFOX completed in 05/2007  Aranesp 300 mcg every 2 to 3 weeks therapy was ineffective to eliminate transfusion needs.  5-azacytidine at 75 mg/m square subcutaneously started in October 2018.  She is status post 7 cycles of therapy completed in April 2019.   Current therapy: Reblozyl 75 mg subcutaneous injection every 3 weeks started in December 2021.  She also receives packed red cell transfusion.     Interim History:  Patricia Mata returns today for a repeat evaluation.  Since the last visit, she reports feeling well without any major complaints.  She is improving in her overall mobility and exercise tolerance.  She denies any nausea or abdominal pain.  She denies shortness of breath or difficulty breathing.  She denies any recent hospitalizations or illnesses.  She has tolerated her injections without any issues or complaints.     Current Outpatient Medications on File Prior to Visit  Medication Sig Dispense Refill  . allopurinol (ZYLOPRIM) 100 MG tablet Take 100 mg by mouth daily.    Marland Kitchen amiodarone (PACERONE) 200 MG tablet Take 200 mg by mouth daily.    Marland Kitchen amLODipine (NORVASC) 10 MG tablet Take 1 tablet (10 mg total) by mouth daily. 90 tablet 3  . atorvastatin (LIPITOR) 40 MG tablet TAKE 1 TABLET BY MOUTH EVERY DAY 90 tablet 0  . blood glucose meter kit and supplies Dispense based on patient and insurance preference. Use up to four times daily as directed. (FOR ICD-10 E10.9, E11.9). 1 each 3   . colchicine 0.6 MG tablet Take 0.6 mg by mouth 2 (two) times daily as needed (for gout).    Marland Kitchen doxazosin (CARDURA) 1 MG tablet Take 1 tablet (1 mg total) by mouth at bedtime. 30 tablet 6  . doxycycline (ADOXA) 100 MG tablet Take 1 tablet (100 mg total) by mouth 2 (two) times daily for 7 days. 14 tablet 0  . furosemide (LASIX) 40 MG tablet TAKE 1 TABLET BY MOUTH EVERY DAY 90 tablet 1  . gabapentin (NEURONTIN) 300 MG capsule     . glucose blood test strip Use as instructed 100 each 12  . hydrALAZINE (APRESOLINE) 100 MG tablet Take 1 tablet (100 mg total) by mouth 3 (three) times daily. 180 tablet 5  . insulin glargine (LANTUS) 100 unit/mL SOPN Inject 0.15 mLs (15 Units total) into the skin daily. (Patient taking differently: Inject 15 Units into the skin daily as needed (blood sugar over 150). )    . Insulin Pen Needle 29G X 5MM MISC Use as directed 200 each 0  . latanoprost (XALATAN) 0.005 % ophthalmic solution Place 1 drop into both eyes at bedtime.     Marland Kitchen levothyroxine (SYNTHROID) 88 MCG tablet Take 1 tablet (88 mcg total) by mouth daily. 30 tablet 2  . metoprolol succinate (TOPROL-XL) 25 MG 24 hr tablet Take 1 tablet (25 mg total) by mouth daily. STOP taking this unless heart rate >100bpm OR directed by your doctor 30 tablet 5  . Multiple Vitamins-Minerals (CENTRUM  SILVER 50+WOMEN) TABS Take 1 tablet by mouth daily.    . sildenafil (REVATIO) 20 MG tablet Take 1 tablet (20 mg total) by mouth 3 (three) times daily. 270 tablet 3  . sitaGLIPtin (JANUVIA) 50 MG tablet Take 1 tablet (50 mg total) by mouth daily. 90 tablet 0      Family history, social history and surgical history remain without any changes on review.         Allergies:  Allergies  Allergen Reactions  . Ancef [Cefazolin] Itching    Severe itching- after procedure, ancef was the antibiotic.-04/02/17  . Sulfa Antibiotics Rash and Other (See Comments)    Blisters, also     Physical Exam:  Blood pressure (!) 161/57,  pulse 67, temperature 98.3 F (36.8 C), temperature source Temporal, resp. rate 17, height 5' 4.2" (1.631 m), weight 183 lb 8 oz (83.2 kg), SpO2 100 %.       ECOG: 2     General appearance: Comfortable appearing without any discomfort Head: Normocephalic without any trauma Oropharynx: Mucous membranes are moist and pink without any thrush or ulcers. Eyes: Pupils are equal and round reactive to light. Lymph nodes: No cervical, supraclavicular, inguinal or axillary lymphadenopathy.   Heart:regular rate and rhythm.  S1 and S2 without leg edema.  Murmur auscultated. Lung: Clear without any rhonchi or wheezes.  No dullness to percussion. Abdomin: Soft, nontender, nondistended with good bowel sounds.  No hepatosplenomegaly. Musculoskeletal: No joint deformity or effusion.  Full range of motion noted. Neurological: No deficits noted on motor, sensory and deep tendon reflex exam. Skin: No petechial rash or dryness.  Appeared moist.                Lab Results: Lab Results  Component Value Date   WBC 5.8 07/06/2019   HGB 8.0 (L) 07/06/2019   HCT 25.1 (L) 07/06/2019   MCV 88.7 07/06/2019   PLT 201 07/06/2019     Chemistry      Component Value Date/Time   NA 138 07/06/2019 1440   NA 135 06/22/2019 1216   NA 134 (L) 05/26/2017 0949   K 4.3 07/06/2019 1440   K 4.0 05/26/2017 0949   CL 102 07/06/2019 1440   CL 96 (L) 03/31/2012 1513   CO2 24 07/06/2019 1440   CO2 24 05/26/2017 0949   BUN 58 (H) 07/06/2019 1440   BUN 64 (H) 06/22/2019 1216   BUN 29.2 (H) 05/26/2017 0949   CREATININE 2.61 (H) 07/06/2019 1440   CREATININE 2.24 (H) 01/27/2019 1355   CREATININE 1.4 (H) 05/26/2017 0949      Component Value Date/Time   CALCIUM 9.1 07/06/2019 1440   CALCIUM 8.9 05/26/2017 0949   ALKPHOS 92 07/06/2019 1440   ALKPHOS 157 (H) 05/26/2017 0949   AST 25 07/06/2019 1440   AST 29 05/26/2017 0949   ALT 12 07/06/2019 1440   ALT 23 05/26/2017 0949   BILITOT 0.5 07/06/2019  1440   BILITOT 2.19 (H) 05/26/2017 0949      Impression and Plan:  64 year old woman with:   1.  Myelodysplastic syndrome with refractory to previous therapy outlined above since 2017.  Marland Kitchen      She has been not transfusion dependent and currently receiving Reblozyl without any major complications.  The natural course of her disease was reviewed today.  Risks and benefits of continuing this current therapy was discussed.  Alternative options would be supportive care only at this time.  She is not a candidate for  stem cell transplant.  She is agreeable to continue with the current plan.  2.  Anemia: Hemoglobin is 8.5 at this time and will defer transfusion for the time being.  She prefer not to have it at this time and overall feels well.  3. Hypertension: Blood pressure is mildly elevated we will continue to monitor 20 visits.   4.  Aortic stenosis: No recent exacerbation or decompensation of her heart failure.  He is to follow with cardiology.   5.  Prognosis: Overall prognosis is poor although performance status remains adequate and aggressive measures are warranted.  6. Follow-up: She will continue to follow every 3 weeks for laboratory testing, injection and possible transfusion.  30  minutes was dedicated to this encounter.  Time was spent on reviewing laboratory data, treatment options and complications related to therapy.   Zola Button, MD 1/12/20218:39 AM

## 2019-07-28 ENCOUNTER — Telehealth: Payer: Self-pay | Admitting: Oncology

## 2019-07-28 NOTE — Telephone Encounter (Signed)
Scheduled appt per 1/12 los.  Sent a message to HIM pool to get a calendar mailed out. 

## 2019-07-29 ENCOUNTER — Encounter (HOSPITAL_COMMUNITY): Payer: BC Managed Care – PPO

## 2019-08-01 ENCOUNTER — Other Ambulatory Visit (HOSPITAL_COMMUNITY): Payer: Self-pay | Admitting: Internal Medicine

## 2019-08-02 ENCOUNTER — Ambulatory Visit: Payer: BC Managed Care – PPO | Admitting: Infectious Disease

## 2019-08-03 ENCOUNTER — Encounter (HOSPITAL_COMMUNITY): Payer: BC Managed Care – PPO

## 2019-08-05 ENCOUNTER — Emergency Department (HOSPITAL_COMMUNITY)
Admission: EM | Admit: 2019-08-05 | Discharge: 2019-08-05 | Disposition: A | Discharge status: Home / Self Care | Payer: BC Managed Care – PPO | Attending: Emergency Medicine | Admitting: Emergency Medicine

## 2019-08-05 ENCOUNTER — Other Ambulatory Visit: Payer: Self-pay

## 2019-08-05 ENCOUNTER — Encounter (HOSPITAL_COMMUNITY): Payer: BC Managed Care – PPO

## 2019-08-05 DIAGNOSIS — I10 Essential (primary) hypertension: Secondary | ICD-10-CM | POA: Diagnosis not present

## 2019-08-05 DIAGNOSIS — R040 Epistaxis: Secondary | ICD-10-CM | POA: Insufficient documentation

## 2019-08-05 DIAGNOSIS — E119 Type 2 diabetes mellitus without complications: Secondary | ICD-10-CM | POA: Diagnosis not present

## 2019-08-05 DIAGNOSIS — Z87891 Personal history of nicotine dependence: Secondary | ICD-10-CM | POA: Diagnosis not present

## 2019-08-05 DIAGNOSIS — Z85038 Personal history of other malignant neoplasm of large intestine: Secondary | ICD-10-CM | POA: Insufficient documentation

## 2019-08-05 DIAGNOSIS — Z79899 Other long term (current) drug therapy: Secondary | ICD-10-CM | POA: Insufficient documentation

## 2019-08-05 LAB — CBC
HCT: 26.2 % — ABNORMAL LOW (ref 36.0–46.0)
Hemoglobin: 8.6 g/dL — ABNORMAL LOW (ref 12.0–15.0)
MCH: 28.9 pg (ref 26.0–34.0)
MCHC: 32.8 g/dL (ref 30.0–36.0)
MCV: 87.9 fL (ref 80.0–100.0)
Platelets: 193 10*3/uL (ref 150–400)
RBC: 2.98 MIL/uL — ABNORMAL LOW (ref 3.87–5.11)
RDW: 21.5 % — ABNORMAL HIGH (ref 11.5–15.5)
WBC: 8.8 10*3/uL (ref 4.0–10.5)
nRBC: 0.2 % (ref 0.0–0.2)

## 2019-08-05 MED ORDER — SALINE SPRAY 0.65 % NA SOLN
1.0000 | Freq: Once | NASAL | Status: AC
  Administered 2019-08-05: 1 via NASAL
  Filled 2019-08-05: qty 44

## 2019-08-05 MED ORDER — TRANEXAMIC ACID 1000 MG/10ML IV SOLN
500.0000 mg | Freq: Once | INTRAVENOUS | Status: AC
  Administered 2019-08-05: 500 mg via TOPICAL
  Filled 2019-08-05: qty 10

## 2019-08-05 MED ORDER — AMOXICILLIN-POT CLAVULANATE 875-125 MG PO TABS: 1.0000 | ORAL_TABLET | Freq: Two times a day (BID) | ORAL | 0 refills | Status: DC

## 2019-08-05 NOTE — ED Notes (Signed)
Unable to obtain signature due to topaz not working.

## 2019-08-05 NOTE — ED Provider Notes (Addendum)
[facility] DEPT Provider Note   CSN: [id number] Arrival date & time: 08/05/19  0414     History No chief complaint on file.   Patricia Mata is a 64 y.o. female.  Patient presents to the emergency department with chief complaint of epistaxis.  She states that the bleeding started last night around 10 PM.  She denies any injury.  She does have a history of hypertension, but has been compliant with her medications.  She denies any lightheadedness or dizziness.  States that the nose been bleeding from the left side only. She states that it has run down the back of her throat some. She states that she has tried to control the bleeding with pressure.  She is not anticoagulated.  She also tried using some Afrin with no relief.  Denies any other associated symptoms.  The history is provided by the patient. No language interpreter was used.       Past Medical History:  Diagnosis Date  . Acute kidney injury (nontraumatic) ([place])   . Acute renal failure (ARF) ([place]) 06/08/2018  . Cancer [facility]) 2008   Colon   . Diabetes mellitus without complication ([place])   . Gout 09/08/2018  . Hypertension   . Macrocytic anemia     Patient Active Problem List   Diagnosis Date Noted  . Neutropenia ([place]) 05/25/2019  . Malignant neoplasm of colon ([place]) 05/25/2019  . Bradycardia 03/21/2019  . Pneumonia due to COVID-19 virus 03/18/2019  . Type 2 diabetes mellitus without complication, without long-term current use of insulin ([place]) 12/10/2018  . Hypothyroidism 12/10/2018  . Seasonal allergies 12/10/2018  . Gout 09/08/2018  . Acute osteomyelitis of pelvic region and thigh ([place])   . 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 Decubitus ulcer of coccygeal region, stage 4 ([place])   . Sacral wound 06/08/2018  . Iron deficiency anemia due to chronic blood loss 06/08/2018  . Hyponatremia 06/08/2018  . Constipation 06/08/2018  . Palliative care encounter   . Pressure injury of  skin 05/22/2018  . Acute hypoxemic respiratory failure ([place])   . Cardiogenic shock ([place])   . Atrial fibrillation with rapid ventricular response ([place]) 05/11/2018  . Symptomatic anemia 06/14/2017  . Severe aortic stenosis 06/10/2017  . Carotid artery disease ([place]) 06/10/2017  . Essential hypertension 05/13/2017  . Hyperlipidemia 05/13/2017  . Bilateral lower extremity edema 05/13/2017  . Port-A-Cath in place 04/28/2017  . MDS (myelodysplastic syndrome) ([place]) 03/20/2017  . Goals of care, counseling/discussion 03/20/2017  . Anemia in chronic kidney disease 05/10/2016  . Anemia in stage 1 chronic kidney disease 05/10/2016  . Anemia 02/09/2016  . 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 COLON SURGERY    . DEBRIDMENT OF DECUBITUS ULCER N/A 06/12/2018   Procedure: DEBRIDMENT OF DECUBITUS ULCER;  Surgeon: Wallace Going, DO;  Location: [facility] ORS;  Service: Plastics;  Laterality: N/A;  . IR FLUORO GUIDE PORT INSERTION RIGHT  04/02/2017  . IR REMOVAL TUN ACCESS W/ PORT W/O FL MOD SED  03/16/2019  . IR US GUIDE VASC ACCESS RIGHT  04/02/2017  . RIGHT HEART CATH N/A 04/23/2019   Procedure: RIGHT HEART CATH;  Surgeon: Jolaine Artist, MD;  Location: [facility] CV LAB;  Service: Cardiovascular;  Laterality: N/A;  . RIGHT/LEFT HEART CATH AND CORONARY ANGIOGRAPHY N/A 05/21/2018   Procedure: RIGHT/LEFT HEART CATH AND CORONARY ANGIOGRAPHY;  Surgeon: Nelva Bush,  MD;  Location: [facility] LAB;  Service: Cardiovascular;  Laterality: N/A;     OB History   No obstetric history on file.     Family History  Problem Relation Age of Onset  . Hypertension Mother   . Diabetes Mother   . Cervical cancer Mother   . Heart attack Father   . Hypertension Sister   . Hypertension Brother   . Hypertension Sister   . Hypertension Sister   . Prostate cancer Brother   . HIV/AIDS Brother     Social  History   Tobacco Use  . Smoking status: Former Smoker    Packs/day: 0.25    Years: 20.00    Pack years: 5.00    Types: Cigarettes    Quit date: 01/31/2001    Years since quitting: 18.5  . Smokeless tobacco: Former Network engineer Use Topics  . Alcohol use: Yes    Alcohol/week: 2.0 standard drinks    Types: 2 Shots of liquor per week  . Drug use: Never    Home Medications Prior to Admission medications   Medication Sig Start Date End Date Taking? Authorizing Provider  allopurinol (ZYLOPRIM) 100 MG tablet Take 100 mg by mouth daily. 05/07/19   [provider]  amiodarone (PACERONE) 200 MG tablet TAKE 1 TABLET BY MOUTH EVERY DAY 08/02/19   Bensimhon, Shaune Pascal, MD  amLODipine (NORVASC) 10 MG tablet Take 1 tablet (10 mg total) by mouth daily. 02/15/19   Bensimhon, Shaune Pascal, MD  atorvastatin (LIPITOR) 40 MG tablet TAKE 1 TABLET BY MOUTH EVERY DAY 11/30/18   [name], FNP  blood glucose meter kit and supplies Dispense based on patient and insurance preference. Use up to four times daily as directed. (FOR ICD-10 E10.9, E11.9). 11/04/18   [name], FNP  Blood Glucose Monitoring Suppl [facility]) w/Device KIT Check blood sugars twice daily E11.9 07/15/19   [name], FNP  colchicine 0.6 MG tablet Take 0.6 mg by mouth 2 (two) times daily as needed (for gout).    [provider]  doxazosin (CARDURA) 1 MG tablet Take 1 tablet (1 mg total) by mouth at bedtime. 05/21/19   Bensimhon, Shaune Pascal, MD  furosemide (LASIX) 40 MG tablet TAKE 1 TABLET BY MOUTH EVERY DAY 05/12/19   [name], FNP  gabapentin (NEURONTIN) 300 MG capsule  05/17/19   [provider]  glucose blood test strip Use as instructed 07/22/19   [name], FNP  hydrALAZINE (APRESOLINE) 100 MG tablet Take 1 tablet (100 mg total) by mouth 3 (three) times daily. 11/20/18   Lorretta Harp, MD  insulin glargine (LANTUS) 100 unit/mL SOPN Inject 0.15 mLs (15 Units total) into the skin  daily. Patient taking differently: Inject 15 Units into the skin daily as needed (blood sugar over 150).  03/23/19   Patrecia Pour, MD  Insulin Pen Needle 29G X 5MM MISC Use as directed 06/16/17   Bonnielee Haff, MD  JANUVIA 50 MG tablet TAKE 1 TABLET BY MOUTH EVERY DAY 07/12/19   [name], FNP  Lancets [facility] ULTRASOFT) lancets Use as instructed to check blood sugars twice daily E11.9 07/15/19   [name], FNP  latanoprost (XALATAN) 0.005 % ophthalmic solution Place 1 drop into both eyes at bedtime.     [provider]  levothyroxine (SYNTHROID) 88 MCG tablet Take 1 tablet (88 mcg total) by mouth daily. 07/15/19 07/14/20  [name], FNP  metoprolol succinate (TOPROL-XL) 25 MG 24 hr tablet Take 1  tablet (25 mg total) by mouth daily. STOP taking this unless heart rate >100bpm OR directed by your doctor 03/23/19   Patrecia Pour, MD  Multiple Vitamins-Minerals (CENTRUM SILVER 50+WOMEN) TABS Take 1 tablet by mouth daily.    [provider]  sildenafil (REVATIO) 20 MG tablet Take 1 tablet (20 mg total) by mouth 3 (three) times daily. 02/25/19   Darrick Grinder D, NP    Allergies    Ancef [cefazolin] and Sulfa antibiotics  Review of Systems   Review of Systems  All other systems reviewed and are negative.   Physical Exam Updated Vital Signs There were no vitals taken for this visit.  Physical Exam Vitals and nursing note reviewed.  Constitutional:      General: She is not in acute distress.    Appearance: She is well-developed.  HENT:     Head: Normocephalic and atraumatic.     Nose:     Comments: Bleeding from left nostril, unable to visualize the source of the bleed Eyes:     Conjunctiva/sclera: Conjunctivae normal.  Cardiovascular:     Rate and Rhythm: Normal rate.     Heart sounds: No murmur.  Pulmonary:     Effort: Pulmonary effort is normal. No respiratory distress.  Abdominal:     General: There is no distension.  Musculoskeletal:     Cervical back:  Neck supple.     Comments: Moves all extremities  Skin:    General: Skin is warm and dry.  Neurological:     Mental Status: She is alert and oriented to person, place, and time.  Psychiatric:        Mood and Affect: Mood normal.        Behavior: Behavior normal.     ED Results / Procedures / Treatments   Labs (all labs ordered are listed, but only abnormal results are displayed) Labs Reviewed - No data to display  EKG None  Radiology No results found.  Procedures .Epistaxis Management  Date/Time: 08/05/2019 4:59 AM Performed by: Montine Circle, PA-C Authorized by: Montine Circle, PA-C   Consent:    Consent obtained:  Verbal   Consent given by:  Patient   Risks discussed:  Bleeding and pain   Alternatives discussed:  No treatment Anesthesia (see MAR for exact dosages):    Anesthesia method:  None Procedure details:    Treatment site:  L anterior   Treatment method:  Anterior pack (Rapid Rhino)   Treatment complexity:  Limited   Treatment episode: initial   Post-procedure details:    Assessment:  Bleeding stopped   Patient tolerance of procedure:  Tolerated well, no immediate complications   (including critical care time)  Medications Ordered in ED Medications - No data to display  ED Course  I have reviewed the triage vital signs and the nursing notes.  Pertinent labs & imaging results that were available during my care of the patient were reviewed by me and considered in my medical decision making (see chart for details).    MDM Rules/Calculators/A&P                       Patient with left sided epistaxis for the last 7 hours.  Will check CBC as patient gets occasional blood transfusions for myelodysplastic syndrome.  No improvement of bleeding with direct pressure and Afrin.  Will proceed with nasal packing.  5:00 AM Left nasal packing placed by me. Will monitor.  HGB is 8.6, which is  about baseline for the patient.  5:14 AM Patient reassessed.   Bleeding appears to have stopped.  Will monitor a short while longer.  5:31 AM Still no evidence of bleeding.  Return precautions discussed.  DC to home.  Final Clinical Impression(s) / ED Diagnoses Final diagnoses:  Epistaxis    Rx / DC Orders ED Discharge Orders         Ordered    amoxicillin-clavulanate (AUGMENTIN) 875-125 MG tablet  Every 12 hours     08/05/19 0530           Montine Circle, PA-C 08/05/19 0531    Molpus, Jenny Reichmann, MD 08/05/19 0557    Montine Circle, PA-C 08/23/19 2351    Molpus, Jenny Reichmann, MD 08/25/19 2241

## 2019-08-05 NOTE — ED Triage Notes (Signed)
Per EMS: Pt is coming from home with c/o nose bleed since 2200 last night. Patient denies dizziness and lightheadedness and is Alert and Oriented x 4. Pt self administered afrin. Hx HTN.  BP 180/80 HR 86 RR 16 SPO2 95% RA

## 2019-08-05 NOTE — ED Notes (Signed)
Patient verbalized discharge instructions. Pt had no further questions at this time. NAD.

## 2019-08-05 NOTE — ED Notes (Signed)
ENT cart at bedside

## 2019-08-06 ENCOUNTER — Telehealth: Payer: Self-pay

## 2019-08-06 NOTE — Telephone Encounter (Signed)
Called patient and let her know that Dr. Alen Blew reviewed her labs and ED visit notes and does not recommend any further treatment other than dealing with the nosebleed which is likely what is causing her to feel bad. Patient verbalized understanding and will call the office with any further questions or concerns.

## 2019-08-06 NOTE — Telephone Encounter (Signed)
-----   Message from [name], MD sent at 08/06/2019  1:00 PM EST ----- Please let her know that I have reviewed her visit to the emergency department and reviewed her labs.  At this time I do not recommend any further treatment other than dealing with her nosebleed which is likely what is causing her to feel bad.  Thanks ----- Message ----- From: Tami Lin, RN Sent: 08/06/2019  12:35 PM EST To: [name], MD  Patient wants to make you aware that she was in the ED yesterday due to a 7 hour nosebleed via the left nostril. Her hemoglobin was 8.6. She said she just wants you to know that she has some fatigue and doesn't feel like her normal self. Denies fever, pain, or any other symptoms. Her left nostril has packing and she said it is a lot of pressure. The packing will not be removed until Tuesday. She said she thinks she may feel bad due to the packing. She has spurts of feeling bad but not constantly.  Patricia Mata

## 2019-08-09 ENCOUNTER — Ambulatory Visit: Payer: BC Managed Care – PPO | Admitting: Podiatry

## 2019-08-10 ENCOUNTER — Encounter (HOSPITAL_COMMUNITY): Payer: BC Managed Care – PPO

## 2019-08-10 DIAGNOSIS — R040 Epistaxis: Secondary | ICD-10-CM | POA: Insufficient documentation

## 2019-08-12 ENCOUNTER — Encounter (HOSPITAL_COMMUNITY): Payer: BC Managed Care – PPO

## 2019-08-17 ENCOUNTER — Encounter (HOSPITAL_COMMUNITY): Payer: BC Managed Care – PPO

## 2019-08-18 ENCOUNTER — Inpatient Hospital Stay: Payer: PPO

## 2019-08-18 ENCOUNTER — Inpatient Hospital Stay: Payer: PPO | Attending: Oncology

## 2019-08-18 ENCOUNTER — Other Ambulatory Visit: Payer: Self-pay

## 2019-08-18 VITALS — BP 143/66 | HR 59

## 2019-08-18 DIAGNOSIS — N189 Chronic kidney disease, unspecified: Secondary | ICD-10-CM

## 2019-08-18 DIAGNOSIS — D469 Myelodysplastic syndrome, unspecified: Secondary | ICD-10-CM

## 2019-08-18 DIAGNOSIS — D464 Refractory anemia, unspecified: Secondary | ICD-10-CM | POA: Insufficient documentation

## 2019-08-18 DIAGNOSIS — D631 Anemia in chronic kidney disease: Secondary | ICD-10-CM

## 2019-08-18 LAB — CMP (CANCER CENTER ONLY)
ALT: 22 U/L (ref 0–44)
AST: 39 U/L (ref 15–41)
Albumin: 3.7 g/dL (ref 3.5–5.0)
Alkaline Phosphatase: 124 U/L (ref 38–126)
Anion gap: 9 (ref 5–15)
BUN: 58 mg/dL — ABNORMAL HIGH (ref 8–23)
CO2: 21 mmol/L — ABNORMAL LOW (ref 22–32)
Calcium: 8.9 mg/dL (ref 8.9–10.3)
Chloride: 110 mmol/L (ref 98–111)
Creatinine: 2.41 mg/dL — ABNORMAL HIGH (ref 0.44–1.00)
GFR, Est AFR Am: 24 mL/min — ABNORMAL LOW (ref >60)
GFR, Estimated: 21 mL/min — ABNORMAL LOW (ref >60)
Glucose, Bld: 136 mg/dL — ABNORMAL HIGH (ref 70–99)
Potassium: 4.1 mmol/L (ref 3.5–5.1)
Sodium: 140 mmol/L (ref 135–145)
Total Bilirubin: 0.5 mg/dL (ref 0.3–1.2)
Total Protein: 6.9 g/dL (ref 6.5–8.1)

## 2019-08-18 LAB — CBC WITH DIFFERENTIAL (CANCER CENTER ONLY)
Abs Immature Granulocytes: 0.05 10*3/uL (ref 0.00–0.07)
Basophils Absolute: 0 10*3/uL (ref 0.0–0.1)
Basophils Relative: 0 %
Eosinophils Absolute: 0 10*3/uL (ref 0.0–0.5)
Eosinophils Relative: 0 %
HCT: 25.8 % — ABNORMAL LOW (ref 36.0–46.0)
Hemoglobin: 8.4 g/dL — ABNORMAL LOW (ref 12.0–15.0)
Immature Granulocytes: 1 %
Lymphocytes Relative: 7 %
Lymphs Abs: 0.5 10*3/uL — ABNORMAL LOW (ref 0.7–4.0)
MCH: 29.2 pg (ref 26.0–34.0)
MCHC: 32.6 g/dL (ref 30.0–36.0)
MCV: 89.6 fL (ref 80.0–100.0)
Monocytes Absolute: 0.7 10*3/uL (ref 0.1–1.0)
Monocytes Relative: 11 %
Neutro Abs: 5.5 10*3/uL (ref 1.7–7.7)
Neutrophils Relative %: 81 %
Platelet Count: 161 10*3/uL (ref 150–400)
RBC: 2.88 MIL/uL — ABNORMAL LOW (ref 3.87–5.11)
RDW: 21.7 % — ABNORMAL HIGH (ref 11.5–15.5)
WBC Count: 6.8 10*3/uL (ref 4.0–10.5)
nRBC: 0.4 % — ABNORMAL HIGH (ref 0.0–0.2)

## 2019-08-18 LAB — SAMPLE TO BLOOD BANK

## 2019-08-18 MED ORDER — LUSPATERCEPT-AAMT 75 MG ~~LOC~~ SOLR
0.9000 mg/kg | Freq: Once | SUBCUTANEOUS | Status: AC
  Administered 2019-08-18: 75 mg via SUBCUTANEOUS
  Filled 2019-08-18: qty 1.5

## 2019-08-18 NOTE — Patient Instructions (Signed)
Luspatercept injection What is this medicine? LUSPATERCEPT (lus PAT er sept) helps your body make more red blood cells. This medicine is used to treat anemia caused by beta thalassemia or myelodysplastic syndromes. This medicine may be used for other purposes; ask your health care provider or pharmacist if you have questions. COMMON BRAND NAME(S): REBLOZYL What should I tell my health care provider before I take this medicine? They need to know if you have any of these conditions:  cigarette smoker  have had your spleen removed  high blood pressure  history of blood clots  an unusual or allergic reaction to luspatercept, other medicines, foods, dyes or preservatives  pregnant or trying to get pregnant  breast-feeding How should I use this medicine? This medicine is for injection under the skin. It is given by a healthcare professional in a hospital or clinic setting. Talk to your pediatrician about the use of the medicine in children. This medicine is not approved for use in children. Overdosage: If you think you have taken too much of this medicine contact a poison control center or emergency room at once. NOTE: This medicine is only for you. Do not share this medicine with others. What if I miss a dose? Keep appointments for follow-up doses. It is important not to miss your dose. Call your doctor or healthcare professional if you are unable to keep an appointment. What may interact with this medicine? Interactions are not expected. This list may not describe all possible interactions. Give your health care provider a list of all the medicines, herbs, non-prescription drugs, or dietary supplements you use. Also tell them if you smoke, drink alcohol, or use illegal drugs. Some items may interact with your medicine. What should I watch for while using this medicine? Your condition will be monitored carefully while you are receiving this medicine. Do not become pregnant while taking  this medicine or for 3 months after stopping it. Women should inform their healthcare professional if they wish to become pregnant or think they might be pregnant. There is a potential for serious side effects and harm to an unborn child. Talk to your healthcare professional for more information. Do not breast-feed an infant while taking this medicine. You may need blood work done while you are taking this medicine. What side effects may I notice from receiving this medicine? Side effects that you should report to your doctor or health care professional as soon as possible:  allergic reactions like skin rash, itching or hives; swelling of the face, lips, or tongue  signs and symptoms of a blood clot such as chest pain; shortness of breath; pain, swelling, or warmth in the leg  signs and symptoms of a stroke like changes in vision; confusion; trouble speaking or understanding; severe headaches; sudden numbness or weakness of the face, arm or leg; trouble walking; dizziness; loss of balance or coordination Side effects that usually do not require medical attention (report these to your doctor or health care professional if they continue or are bothersome):  cough  diarrhea  dizziness  headache  joint pain  stomach pain  tiredness This list may not describe all possible side effects. Call your doctor for medical advice about side effects. You may report side effects to FDA at 1-800-FDA-1088. Where should I keep my medicine? This medicine is given in a hospital or clinic and will not be stored at home. NOTE: This sheet is a summary. It may not cover all possible information. If you have questions about   this medicine, talk to your doctor, pharmacist, or health care provider.  2020 Elsevier/Gold Standard (2018-10-20 15:57:59)  

## 2019-08-18 NOTE — Progress Notes (Signed)
Patient aware of lab results and hemoglobin today, she was relieved she did not need any blood because she is feeling well. She is aware she will receive her injection today.

## 2019-08-19 ENCOUNTER — Encounter (HOSPITAL_COMMUNITY): Payer: BC Managed Care – PPO

## 2019-08-19 ENCOUNTER — Telehealth (HOSPITAL_COMMUNITY): Payer: Self-pay | Admitting: Pharmacist

## 2019-08-19 NOTE — Telephone Encounter (Signed)
Patient Advocate Encounter   Received notification from [employer] that prior authorization for Sildenafil is required.   PA submitted on CoverMyMeds Key  [id number] Status is pending   Will continue to follow.  Audry Riles, PharmD, BCPS, BCCP, CPP Heart Failure Clinic Pharmacist [phone]

## 2019-08-20 ENCOUNTER — Other Ambulatory Visit: Payer: Self-pay | Admitting: Nurse Practitioner

## 2019-08-20 DIAGNOSIS — E7800 Pure hypercholesterolemia, unspecified: Secondary | ICD-10-CM

## 2019-08-20 NOTE — Telephone Encounter (Signed)
Advanced Heart Failure Patient Advocate Encounter  Prior Authorization for Sildenafil has been approved.    Effective dates: 08/19/19 through 08/18/20  Patients co-pay is $15.00  Audry Riles, PharmD, BCPS, BCCP, CPP Heart Failure Clinic Pharmacist [phone]

## 2019-08-23 ENCOUNTER — Telehealth (HOSPITAL_COMMUNITY): Payer: Self-pay | Admitting: *Deleted

## 2019-08-23 NOTE — Telephone Encounter (Signed)
Called patient to inform her that we will resume in person pulmonary rehab exercise classes beginning 09/07/2019/  [facility] to see if she is interested in returning.

## 2019-08-24 ENCOUNTER — Telehealth (HOSPITAL_COMMUNITY): Payer: Self-pay | Admitting: Nurse Practitioner

## 2019-08-25 ENCOUNTER — Other Ambulatory Visit: Payer: Self-pay | Admitting: Nurse Practitioner

## 2019-08-26 ENCOUNTER — Ambulatory Visit (HOSPITAL_COMMUNITY): Payer: PPO

## 2019-08-26 ENCOUNTER — Telehealth (HOSPITAL_COMMUNITY): Payer: Self-pay | Admitting: *Deleted

## 2019-08-26 DIAGNOSIS — Z86010 Personal history of colonic polyps: Secondary | ICD-10-CM | POA: Diagnosis not present

## 2019-08-26 DIAGNOSIS — Z85038 Personal history of other malignant neoplasm of large intestine: Secondary | ICD-10-CM | POA: Diagnosis not present

## 2019-08-26 NOTE — Telephone Encounter (Signed)
Returned call, LM that class begins 09/07/19 be here @ 1245.

## 2019-08-31 ENCOUNTER — Ambulatory Visit (HOSPITAL_COMMUNITY): Payer: PPO

## 2019-08-31 DIAGNOSIS — N184 Chronic kidney disease, stage 4 (severe): Secondary | ICD-10-CM | POA: Diagnosis not present

## 2019-08-31 DIAGNOSIS — I129 Hypertensive chronic kidney disease with stage 1 through stage 4 chronic kidney disease, or unspecified chronic kidney disease: Secondary | ICD-10-CM | POA: Diagnosis not present

## 2019-08-31 DIAGNOSIS — Z85038 Personal history of other malignant neoplasm of large intestine: Secondary | ICD-10-CM | POA: Diagnosis not present

## 2019-08-31 DIAGNOSIS — D649 Anemia, unspecified: Secondary | ICD-10-CM | POA: Diagnosis not present

## 2019-08-31 NOTE — Telephone Encounter (Signed)
Opened by accident, please disregard. 

## 2019-08-31 NOTE — Progress Notes (Signed)
Pulmonary Individual Treatment Plan  Patient Details  Name: Patricia Mata MRN: [id number] Date of Birth: 01-02-1956 Referring Provider:     Pulmonary Rehab Walk Test from 06/08/2019 in [place]  Referring Provider  Dr. Haroldine Laws       Initial Encounter Date:    Pulmonary Rehab Walk Test from 06/08/2019 in [place]  Date  06/08/19      Visit Diagnosis: Pulmonary hypertension ([place])  Patient's Home Medications on Admission:   Current Outpatient Medications:  .  allopurinol (ZYLOPRIM) 100 MG tablet, Take 100 mg by mouth daily., Disp: , Rfl:  .  amiodarone (PACERONE) 200 MG tablet, TAKE 1 TABLET BY MOUTH EVERY DAY (Patient taking differently: Take 200 mg by mouth daily. ), Disp: 90 tablet, Rfl: 1 .  amLODipine (NORVASC) 10 MG tablet, Take 1 tablet (10 mg total) by mouth daily., Disp: 90 tablet, Rfl: 3 .  amoxicillin-clavulanate (AUGMENTIN) 875-125 MG tablet, Take 1 tablet by mouth every 12 (twelve) hours., Disp: 14 tablet, Rfl: 0 .  atorvastatin (LIPITOR) 40 MG tablet, TAKE 1 TABLET BY MOUTH EVERY DAY, Disp: 90 tablet, Rfl: 0 .  blood glucose meter kit and supplies, Dispense based on patient and insurance preference. Use up to four times daily as directed. (FOR ICD-10 E10.9, E11.9)., Disp: 1 each, Rfl: 3 .  Blood Glucose Monitoring Suppl (ONETOUCH ULTRALINK) w/Device KIT, Check blood sugars twice daily E11.9, Disp: 1 kit, Rfl: 0 .  colchicine 0.6 MG tablet, TAKE 1 TABLET BY MOUTH TWICE A DAY AS NEEDED FOR GOUT, Disp: 60 tablet, Rfl: 1 .  doxazosin (CARDURA) 1 MG tablet, Take 1 tablet (1 mg total) by mouth at bedtime., Disp: 30 tablet, Rfl: 6 .  furosemide (LASIX) 40 MG tablet, TAKE 1 TABLET BY MOUTH EVERY DAY (Patient taking differently: Take 40 mg by mouth daily. ), Disp: 90 tablet, Rfl: 1 .  glucose blood test strip, Use as instructed, Disp: 100 each, Rfl: 12 .  hydrALAZINE (APRESOLINE) 100 MG tablet, Take 1 tablet (100 mg  total) by mouth 3 (three) times daily., Disp: 180 tablet, Rfl: 5 .  insulin glargine (LANTUS) 100 unit/mL SOPN, Inject 0.15 mLs (15 Units total) into the skin daily. (Patient taking differently: Inject 5 Units into the skin 2 (two) times daily as needed (blood sugar over 150). ), Disp: , Rfl:  .  Insulin Pen Needle 29G X 5MM MISC, Use as directed, Disp: 200 each, Rfl: 0 .  JANUVIA 50 MG tablet, TAKE 1 TABLET BY MOUTH EVERY DAY (Patient taking differently: Take 50 mg by mouth daily. ), Disp: 90 tablet, Rfl: 0 .  Lancets (ONETOUCH ULTRASOFT) lancets, Use as instructed to check blood sugars twice daily E11.9, Disp: 100 each, Rfl: 12 .  latanoprost (XALATAN) 0.005 % ophthalmic solution, Place 1 drop into both eyes at bedtime. , Disp: , Rfl:  .  levothyroxine (SYNTHROID) 88 MCG tablet, Take 1 tablet (88 mcg total) by mouth daily., Disp: 30 tablet, Rfl: 2 .  metoprolol succinate (TOPROL-XL) 25 MG 24 hr tablet, Take 1 tablet (25 mg total) by mouth daily. STOP taking this unless heart rate >100bpm OR directed by your doctor (Patient not taking: Reported on 08/05/2019), Disp: 30 tablet, Rfl: 5 .  Multiple Vitamins-Minerals (CENTRUM SILVER 50+WOMEN) TABS, Take 1 tablet by mouth daily., Disp: , Rfl:  .  oxymetazoline (AFRIN) 0.05 % nasal spray, Place 1 spray into both nostrils 2 (two) times daily as needed for congestion., Disp: ,  Rfl:  .  sildenafil (REVATIO) 20 MG tablet, Take 1 tablet (20 mg total) by mouth 3 (three) times daily., Disp: 270 tablet, Rfl: 3 No current facility-administered medications for this encounter.  Facility-Administered Medications Ordered in Other Encounters:  .  sodium chloride flush (NS) 0.9 % injection 10 mL, 10 mL, Intravenous, PRN, Alen Blew, Mathis Dad, MD  Past Medical History: Past Medical History:  Diagnosis Date  . Acute kidney injury (nontraumatic) ([place])   . Acute renal failure (ARF) ([place]) 06/08/2018  . Cancer [facility]) 2008   Colon   . Diabetes mellitus without complication  ([place])   . Gout 09/08/2018  . Hypertension   . Macrocytic anemia     Tobacco Use: Social History   Tobacco Use  Smoking Status Former Smoker  . Packs/day: 0.25  . Years: 20.00  . Pack years: 5.00  . Types: Cigarettes  . Quit date: 01/31/2001  . 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[employer] Cardiac and Pulmonary Rehab Latest Ref Rng & Units 04/19/2019 04/23/2019 04/23/2019 04/23/2019 06/22/2019   Cholestrol 0 - 200 mg/dL - - - - -   LDLCALC 0 - 99 mg/dL - - - - -   HDL >40 mg/dL - - - - -   Trlycerides <150 mg/dL - - - - -   Hemoglobin A1c 4.8 - 5.6 % 6.5(H) - - - 6.0(H)   PHART 7.350 - 7.450 - - - - -   PCO2ART 32.0 - 48.0 mmHg - - - - -   HCO3 20.0 - 28.0 mmol/L - 22.7 23.8 22.9 -   TCO2 22 - 32 mmol/L - '24 25 24 '$ -   ACIDBASEDEF 0.0 - 2.0 mmol/L - 2.0 1.0 2.0 -   O2SAT % - 73.0 75.0 77.0 -      Capillary Blood Glucose: Lab Results  Component Value Date   GLUCAP 159 (H) 04/23/2019   GLUCAP 218 (H) 03/23/2019   GLUCAP 365 (H) 03/23/2019   GLUCAP 396 (H) 03/22/2019   GLUCAP 242 (H) 03/22/2019     Pulmonary Assessment Scores: Pulmonary Assessment Scores    Row Name 06/08/19 1515 06/08/19 1551       ADL UCSD   ADL Phase  Entry  Entry    SOB Score total  12  -      CAT Score   CAT Score  10  -      mMRC Score   mMRC Score  -  3      UCSD: Self-administered rating of dyspnea associated with activities of daily living (ADLs) 6-point scale (0 = "not at all" to 5 = "maximal or unable to do because of breathlessness")  Scoring Scores range from 0 to 120.  Minimally important difference is 5 units  CAT: CAT can identify the health impairment of COPD patients and is better correlated with disease progression.  CAT has a scoring range of zero to 40. The CAT score is classified into four groups of low (less than 10), medium (10 - 20), high (21-30) and very high (31-40) based on the impact level of  disease on health status. A CAT score over 10 suggests significant symptoms.  A worsening CAT score could be explained by an exacerbation, poor medication adherence, poor inhaler technique, or progression of COPD or comorbid conditions.  CAT MCID is 2 points  mMRC: mMRC (Modified Medical Research Council) Dyspnea Scale is used to  assess the degree of baseline functional disability in patients of respiratory disease due to dyspnea. No minimal important difference is established. A decrease in score of 1 point or greater is considered a positive change.   Pulmonary Function Assessment: Pulmonary Function Assessment - 06/08/19 1515      Breath   Bilateral Breath Sounds  Clear    Shortness of Breath  No       Exercise Target Goals: Exercise Program Goal: Individual exercise prescription set using results from initial 6 min walk test and THRR while considering  patient's activity barriers and safety.   Exercise Prescription Goal: Initial exercise prescription builds to 30-45 minutes a day of aerobic activity, 2-3 days per week.  Home exercise guidelines will be given to patient during program as part of exercise prescription that the participant will acknowledge.  Activity Barriers & Risk Stratification: Activity Barriers & Cardiac Risk Stratification - 06/08/19 1504      Activity Barriers & Cardiac Risk Stratification   Activity Barriers  Arthritis;Deconditioning;Muscular Weakness       6 Minute Walk: 6 Minute Walk    Row Name 06/08/19 1552         6 Minute Walk   Phase  Initial     Distance  500 feet     Walk Time  4.08 minutes     # of Rest Breaks  3     MPH  0.95     METS  1.98     RPE  13     Perceived Dyspnea   0     VO2 Peak  6.93     Symptoms  Yes (comment)     Comments  3 seated rest breaks 30 sec, 30 sec, 55 sec all due to leg fatigue     Resting HR  71 bpm     Resting BP  144/56     Resting Oxygen Saturation   100 %     Exercise Oxygen Saturation  during 6  min walk  99 %     Max Ex. HR  100 bpm     Max Ex. BP  184/60     2 Minute Post BP  156/48       Interval HR   1 Minute HR  94     2 Minute HR  96     3 Minute HR  95     4 Minute HR  91     5 Minute HR  100     6 Minute HR  100     2 Minute Post HR  79     Interval Heart Rate?  Yes       Interval Oxygen   Interval Oxygen?  Yes     Baseline Oxygen Saturation %  100 %     1 Minute Oxygen Saturation %  99 %     1 Minute Liters of Oxygen  0 L     2 Minute Oxygen Saturation %  100 %     2 Minute Liters of Oxygen  0 L     3 Minute Oxygen Saturation %  99 %     3 Minute Liters of Oxygen  0 L     4 Minute Oxygen Saturation %  99 %     4 Minute Liters of Oxygen  0 L     5 Minute Oxygen Saturation %  99 %     5 Minute Liters of Oxygen  0 L     6 Minute Oxygen Saturation %  99 %     6 Minute Liters of Oxygen  0 L     2 Minute Post Oxygen Saturation %  100 %     2 Minute Post Liters of Oxygen  0 L        Oxygen Initial Assessment: Oxygen Initial Assessment - 06/08/19 1551      Home Oxygen   Home Oxygen Device  None    Sleep Oxygen Prescription  None    Home Exercise Oxygen Prescription  None    Home at Rest Exercise Oxygen Prescription  None    Compliance with Home Oxygen Use  Yes      Initial 6 min Walk   Oxygen Used  None      Program Oxygen Prescription   Program Oxygen Prescription  None      Intervention   Short Term Goals  To learn and exhibit compliance with exercise, home and travel O2 prescription;To learn and understand importance of monitoring SPO2 with pulse oximeter and demonstrate accurate use of the pulse oximeter.;To learn and understand importance of maintaining oxygen saturations>88%;To learn and demonstrate proper pursed lip breathing techniques or other breathing techniques.;To learn and demonstrate proper use of respiratory medications    Long  Term Goals  Exhibits compliance with exercise, home and travel O2 prescription;Maintenance of O2  saturations>88%;Verbalizes importance of monitoring SPO2 with pulse oximeter and return demonstration;Exhibits proper breathing techniques, such as pursed lip breathing or other method taught during program session;Compliance with respiratory medication       Oxygen Re-Evaluation: Oxygen Re-Evaluation    Row Name 06/28/19 1018 08/31/19 0906           Program Oxygen Prescription   Program Oxygen Prescription  None  None        Home Oxygen   Home Oxygen Device  None  None      Sleep Oxygen Prescription  None  None      Home Exercise Oxygen Prescription  None  None      Home at Rest Exercise Oxygen Prescription  None  None      Compliance with Home Oxygen Use  Yes  Yes        Goals/Expected Outcomes   Short Term Goals  To learn and exhibit compliance with exercise, home and travel O2 prescription;To learn and understand importance of monitoring SPO2 with pulse oximeter and demonstrate accurate use of the pulse oximeter.;To learn and understand importance of maintaining oxygen saturations>88%;To learn and demonstrate proper pursed lip breathing techniques or other breathing techniques.;To learn and demonstrate proper use of respiratory medications  To learn and exhibit compliance with exercise, home and travel O2 prescription;To learn and understand importance of monitoring SPO2 with pulse oximeter and demonstrate accurate use of the pulse oximeter.;To learn and understand importance of maintaining oxygen saturations>88%;To learn and demonstrate proper pursed lip breathing techniques or other breathing techniques.;To learn and demonstrate proper use of respiratory medications      Long  Term Goals  Exhibits compliance with exercise, home and travel O2 prescription;Maintenance of O2 saturations>88%;Verbalizes importance of monitoring SPO2 with pulse oximeter and return demonstration;Exhibits proper breathing techniques, such as pursed lip breathing or other method taught during program  session;Compliance with respiratory medication  Exhibits compliance with exercise, home and travel O2 prescription;Maintenance of O2 saturations>88%;Verbalizes importance of monitoring SPO2 with pulse oximeter and return demonstration;Exhibits proper breathing techniques, such as pursed lip breathing or other method taught  during program session;Compliance with respiratory medication      Goals/Expected Outcomes  compliance  compliance         Oxygen Discharge (Final Oxygen Re-Evaluation): Oxygen Re-Evaluation - 08/31/19 0906      Program Oxygen Prescription   Program Oxygen Prescription  None      Home Oxygen   Home Oxygen Device  None    Sleep Oxygen Prescription  None    Home Exercise Oxygen Prescription  None    Home at Rest Exercise Oxygen Prescription  None    Compliance with Home Oxygen Use  Yes      Goals/Expected Outcomes   Short Term Goals  To learn and exhibit compliance with exercise, home and travel O2 prescription;To learn and understand importance of monitoring SPO2 with pulse oximeter and demonstrate accurate use of the pulse oximeter.;To learn and understand importance of maintaining oxygen saturations>88%;To learn and demonstrate proper pursed lip breathing techniques or other breathing techniques.;To learn and demonstrate proper use of respiratory medications    Long  Term Goals  Exhibits compliance with exercise, home and travel O2 prescription;Maintenance of O2 saturations>88%;Verbalizes importance of monitoring SPO2 with pulse oximeter and return demonstration;Exhibits proper breathing techniques, such as pursed lip breathing or other method taught during program session;Compliance with respiratory medication    Goals/Expected Outcomes  compliance       Initial Exercise Prescription: Initial Exercise Prescription - 06/08/19 1500      Date of Initial Exercise RX and Referring Provider   Date  06/08/19    Referring Provider  Dr. Haroldine Laws       NuStep   Level   2    SPM  80    Minutes  30      Prescription Details   Frequency (times per week)  2    Duration  Progress to 30 minutes of continuous aerobic without signs/symptoms of physical distress      Intensity   THRR 40-80% of Max Heartrate  62-125    Ratings of Perceived Exertion  11-13    Perceived Dyspnea  0-4      Progression   Progression  Continue progressive overload as per policy without signs/symptoms or physical distress.      Resistance Training   Training Prescription  Yes    Weight  orange bands    Reps  10-15       Perform Capillary Blood Glucose checks as needed.  Exercise Prescription Changes: Exercise Prescription Changes    Row Name 06/29/19 1500 07/06/19 1447           Response to Exercise   Blood Pressure (Admit)  160/60  140/70      Blood Pressure (Exercise)  130/60  150/54      Blood Pressure (Exit)  130/54  120/62      Heart Rate (Admit)  75 bpm  60 bpm      Heart Rate (Exercise)  68 bpm  71 bpm      Heart Rate (Exit)  63 bpm  62 bpm      Oxygen Saturation (Admit)  100 %  100 %      Oxygen Saturation (Exercise)  99 %  100 %      Oxygen Saturation (Exit)  100 %  100 %      Rating of Perceived Exertion (Exercise)  11  13      Perceived Dyspnea (Exercise)  0  0      Duration  Continue with 30 min  of aerobic exercise without signs/symptoms of physical distress.  Continue with 30 min of aerobic exercise without signs/symptoms of physical distress.      Intensity  THRR unchanged  THRR unchanged        Resistance Training   Training Prescription  Yes  Yes      Weight  orange bands  orange bands      Reps  10-15  10-15      Time  10 Minutes  10 Minutes        NuStep   Level  2  2      SPM  80  80      Minutes  30  30      METs  1.7  1.8         Exercise Comments:   Exercise Goals and Review: Exercise Goals    Row Name 06/08/19 1558 06/28/19 1018 08/31/19 0906         Exercise Goals   Increase Physical Activity  Yes  Yes  Yes      Intervention  Provide advice, education, support and counseling about physical activity/exercise needs.;Develop an individualized exercise prescription for aerobic and resistive training based on initial evaluation findings, risk stratification, comorbidities and participant's personal goals.  Provide advice, education, support and counseling about physical activity/exercise needs.;Develop an individualized exercise prescription for aerobic and resistive training based on initial evaluation findings, risk stratification, comorbidities and participant's personal goals.  Provide advice, education, support and counseling about physical activity/exercise needs.;Develop an individualized exercise prescription for aerobic and resistive training based on initial evaluation findings, risk stratification, comorbidities and participant's personal goals.     Expected Outcomes  Short Term: Attend rehab on a regular basis to increase amount of physical activity.;Long Term: Add in home exercise to make exercise part of routine and to increase amount of physical activity.;Long Term: Exercising regularly at least 3-5 days a week.  Short Term: Attend rehab on a regular basis to increase amount of physical activity.;Long Term: Add in home exercise to make exercise part of routine and to increase amount of physical activity.;Long Term: Exercising regularly at least 3-5 days a week.  Short Term: Attend rehab on a regular basis to increase amount of physical activity.;Long Term: Add in home exercise to make exercise part of routine and to increase amount of physical activity.;Long Term: Exercising regularly at least 3-5 days a week.     Increase Strength and Stamina  Yes  Yes  Yes     Intervention  Provide advice, education, support and counseling about physical activity/exercise needs.;Develop an individualized exercise prescription for aerobic and resistive training based on initial evaluation findings, risk stratification,  comorbidities and participant's personal goals.  Provide advice, education, support and counseling about physical activity/exercise needs.;Develop an individualized exercise prescription for aerobic and resistive training based on initial evaluation findings, risk stratification, comorbidities and participant's personal goals.  Provide advice, education, support and counseling about physical activity/exercise needs.;Develop an individualized exercise prescription for aerobic and resistive training based on initial evaluation findings, risk stratification, comorbidities and participant's personal goals.     Expected Outcomes  Short Term: Increase workloads from initial exercise prescription for resistance, speed, and METs.;Short Term: Perform resistance training exercises routinely during rehab and add in resistance training at home;Long Term: Improve cardiorespiratory fitness, muscular endurance and strength as measured by increased METs and functional capacity (6MWT)  Short Term: Increase workloads from initial exercise prescription for resistance, speed, and METs.;Short Term: Perform resistance training exercises  routinely during rehab and add in resistance training at home;Long Term: Improve cardiorespiratory fitness, muscular endurance and strength as measured by increased METs and functional capacity (6MWT)  Short Term: Increase workloads from initial exercise prescription for resistance, speed, and METs.;Short Term: Perform resistance training exercises routinely during rehab and add in resistance training at home;Long Term: Improve cardiorespiratory fitness, muscular endurance and strength as measured by increased METs and functional capacity (6MWT)     Able to understand and use rate of perceived exertion (RPE) scale  Yes  Yes  Yes     Intervention  Provide education and explanation on how to use RPE scale  Provide education and explanation on how to use RPE scale  Provide education and explanation on how  to use RPE scale     Expected Outcomes  Short Term: Able to use RPE daily in rehab to express subjective intensity level;Long Term:  Able to use RPE to guide intensity level when exercising independently  Short Term: Able to use RPE daily in rehab to express subjective intensity level;Long Term:  Able to use RPE to guide intensity level when exercising independently  Short Term: Able to use RPE daily in rehab to express subjective intensity level;Long Term:  Able to use RPE to guide intensity level when exercising independently     Able to understand and use Dyspnea scale  Yes  Yes  Yes     Intervention  Provide education and explanation on how to use Dyspnea scale  Provide education and explanation on how to use Dyspnea scale  Provide education and explanation on how to use Dyspnea scale     Expected Outcomes  Short Term: Able to use Dyspnea scale daily in rehab to express subjective sense of shortness of breath during exertion;Long Term: Able to use Dyspnea scale to guide intensity level when exercising independently  Short Term: Able to use Dyspnea scale daily in rehab to express subjective sense of shortness of breath during exertion;Long Term: Able to use Dyspnea scale to guide intensity level when exercising independently  Short Term: Able to use Dyspnea scale daily in rehab to express subjective sense of shortness of breath during exertion;Long Term: Able to use Dyspnea scale to guide intensity level when exercising independently     Knowledge and understanding of Target Heart Rate Range (THRR)  Yes  Yes  Yes     Intervention  -  Provide education and explanation of THRR including how the numbers were predicted and where they are located for reference  Provide education and explanation of THRR including how the numbers were predicted and where they are located for reference     Expected Outcomes  Short Term: Able to state/look up THRR;Short Term: Able to use daily as guideline for intensity in  rehab;Long Term: Able to use THRR to govern intensity when exercising independently  Short Term: Able to state/look up THRR;Short Term: Able to use daily as guideline for intensity in rehab;Long Term: Able to use THRR to govern intensity when exercising independently  Short Term: Able to state/look up THRR;Short Term: Able to use daily as guideline for intensity in rehab;Long Term: Able to use THRR to govern intensity when exercising independently     Understanding of Exercise Prescription  Yes  Yes  Yes     Intervention  Provide education, explanation, and written materials on patient's individual exercise prescription  Provide education, explanation, and written materials on patient's individual exercise prescription  Provide education, explanation, and written materials on  patient's individual exercise prescription     Expected Outcomes  Short Term: Able to explain program exercise prescription;Long Term: Able to explain home exercise prescription to exercise independently  Short Term: Able to explain program exercise prescription;Long Term: Able to explain home exercise prescription to exercise independently  Short Term: Able to explain program exercise prescription;Long Term: Able to explain home exercise prescription to exercise independently        Exercise Goals Re-Evaluation : Exercise Goals Re-Evaluation    Row Name 06/28/19 1019 08/31/19 0907           Exercise Goal Re-Evaluation   Exercise Goals Review  Increase Physical Activity;Increase Strength and Stamina;Able to understand and use rate of perceived exertion (RPE) scale;Able to understand and use Dyspnea scale;Knowledge and understanding of Target Heart Rate Range (THRR);Understanding of Exercise Prescription  Increase Physical Activity;Increase Strength and Stamina;Able to understand and use rate of perceived exertion (RPE) scale;Able to understand and use Dyspnea scale;Knowledge and understanding of Target Heart Rate Range  (THRR);Understanding of Exercise Prescription      Comments  Pt has completed 2 exercise sessions. I will be working with pt to build up her leg strength and muscle stamina. Pt is currently limited by both. Pt shows a positive outlook and is eager to work hard. Pt currently exercises at 1.8 METs on the stepper. Will continue to monitor and progress as able.  It is unknown if pt has exercised since we ceased in person exercise, as she did not wish to be called. Pt will return 2/23. Will monitor and progress as able.      Expected Outcomes  Through exercise at rehab and at home, the patient will decrease shortness of breath with daily activities and feel confident in carrying out an exercise regime at home.  Through exercise at rehab and at home, the patient will decrease shortness of breath with daily activities and feel confident in carrying out an exercise regime at home.         Discharge Exercise Prescription (Final Exercise Prescription Changes): Exercise Prescription Changes - 07/06/19 1447      Response to Exercise   Blood Pressure (Admit)  140/70    Blood Pressure (Exercise)  150/54    Blood Pressure (Exit)  120/62    Heart Rate (Admit)  60 bpm    Heart Rate (Exercise)  71 bpm    Heart Rate (Exit)  62 bpm    Oxygen Saturation (Admit)  100 %    Oxygen Saturation (Exercise)  100 %    Oxygen Saturation (Exit)  100 %    Rating of Perceived Exertion (Exercise)  13    Perceived Dyspnea (Exercise)  0    Duration  Continue with 30 min of aerobic exercise without signs/symptoms of physical distress.    Intensity  THRR unchanged      Resistance Training   Training Prescription  Yes    Weight  orange bands    Reps  10-15    Time  10 Minutes      NuStep   Level  2    SPM  80    Minutes  30    METs  1.8       Nutrition:  Target Goals: Understanding of nutrition guidelines, daily intake of sodium <1563m, cholesterol <2066m calories 30% from fat and 7% or less from saturated fats,  daily to have 5 or more servings of fruits and vegetables.  Biometrics: Pre Biometrics - 06/08/19 1505  Pre Biometrics   Height  _0  (1.626 m)    Weight  82.2 kg    BMI (Calculated)  31.09    Grip Strength  21 kg        Nutrition Therapy Plan and Nutrition Goals: Nutrition Therapy & Goals - 06/29/19 1348      Nutrition Therapy   Diet  Therapeutic Lifestyle Changes    Drug/Food Interactions  Statins/Certain Fruits      Personal Nutrition Goals   Nutrition Goal  Pt to build a healthy plate including vegetables, fruits, whole grains, and low-fat dairy products in a heart healthy meal plan.    Personal Goal #2  CBG concentrations in the normal range or as close to normal as is safely possible.      Intervention Plan   Intervention  Prescribe, educate and counsel regarding individualized specific dietary modifications aiming towards targeted core components such as weight, hypertension, lipid management, diabetes, heart failure and other comorbidities.;Nutrition handout(s) given to patient.    Expected Outcomes  Short Term Goal: Understand basic principles of dietary content, such as calories, fat, sodium, cholesterol and nutrients.;Long Term Goal: Adherence to prescribed nutrition plan.       Nutrition Assessments: Nutrition Assessments - 06/29/19 1406      Rate Your Plate Scores   Pre Score  47       Nutrition Goals Re-Evaluation: Nutrition Goals Re-Evaluation    Row Name 06/29/19 1406             Goals   Current Weight  181 lb (82.1 kg)       Nutrition Goal  Pt to build a healthy plate including vegetables, fruits, whole grains, and low-fat dairy products in a heart healthy meal plan.         Personal Goal #2 Re-Evaluation   Personal Goal #2  CBG concentrations in the normal range or as close to normal as is safely possible.          Nutrition Goals Discharge (Final Nutrition Goals Re-Evaluation): Nutrition Goals Re-Evaluation - 06/29/19 1406      Goals    Current Weight  181 lb (82.1 kg)    Nutrition Goal  Pt to build a healthy plate including vegetables, fruits, whole grains, and low-fat dairy products in a heart healthy meal plan.      Personal Goal #2 Re-Evaluation   Personal Goal #2  CBG concentrations in the normal range or as close to normal as is safely possible.       Psychosocial: Target Goals: Acknowledge presence or absence of significant depression and/or stress, maximize coping skills, provide positive support system. Participant is able to verbalize types and ability to use techniques and skills needed for reducing stress and depression.  Initial Review & Psychosocial Screening: Initial Psych Review & Screening - 06/08/19 1517      Initial Review   Current issues with  None Identified      Family Dynamics   Good Support System?  Yes      Barriers   Psychosocial barriers to participate in program  There are no identifiable barriers or psychosocial needs.      Screening Interventions   Interventions  Encouraged to exercise       Quality of Life Scores:  Scores of 19 and below usually indicate a poorer quality of life in these areas.  A difference of  2-3 points is a clinically meaningful difference.  A difference of 2-3 points in the total score  of the Quality of Life Index has been associated with significant improvement in overall quality of life, self-image, physical symptoms, and general health in studies assessing change in quality of life.  PHQ-9: Recent Review Flowsheet Data    Depression screen [facility] 2/9 06/22/2019 06/08/2019 12/09/2018 09/09/2018   Decreased Interest 0 0 0 0   Down, Depressed, Hopeless 0 0 0 0   PHQ - 2 Score 0 0 0 0   Altered sleeping - 0 - -   Tired, decreased energy - 0 - -   Change in appetite - 0 - -   Feeling bad or failure about yourself  - 0 - -   Trouble concentrating - 0 - -   Moving slowly or fidgety/restless - 0 - -   Suicidal thoughts - 0 - -   PHQ-9 Score - 0 - -    Difficult doing work/chores - Not difficult at all - -     Interpretation of Total Score  Total Score Depression Severity:  1-4 = Minimal depression, 5-9 = Mild depression, 10-14 = Moderate depression, 15-19 = Moderately severe depression, 20-27 = Severe depression   Psychosocial Evaluation and Intervention: Psychosocial Evaluation - 06/08/19 1517      Psychosocial Evaluation & Interventions   Interventions  Encouraged to exercise with the program and follow exercise prescription    Continue Psychosocial Services   No Follow up required       Psychosocial Re-Evaluation: Psychosocial Re-Evaluation    Row Name 06/08/19 1518 06/28/19 1212 08/30/19 1403         Psychosocial Re-Evaluation   Current issues with  None Identified  None Identified  None Identified     Comments  -  No psychosocial concerns identified at this point, Brad just started the program and has attended 2 exercise sessions.  No psyco-social concerns identified after talking with patient on the phone, department has been closed x 7 weeks and will reopen for in person exercise 09/07/2019, she is excited to return.     Expected Outcomes  -  That Marka will experience no barriers or psychosocial concerns while in pulmonary rehab.  No psychosocial concerns while in program.     Interventions  Encouraged to attend Pulmonary Rehabilitation for the exercise  Encouraged to attend Pulmonary Rehabilitation for the exercise  Encouraged to attend Pulmonary Rehabilitation for the exercise     Continue Psychosocial Services   No Follow up required  No Follow up required  No Follow up required        Psychosocial Discharge (Final Psychosocial Re-Evaluation): Psychosocial Re-Evaluation - 08/30/19 1403      Psychosocial Re-Evaluation   Current issues with  None Identified    Comments  No psyco-social concerns identified after talking with patient on the phone, department has been closed x 7 weeks and will reopen for in person  exercise 09/07/2019, she is excited to return.    Expected Outcomes  No psychosocial concerns while in program.    Interventions  Encouraged to attend Pulmonary Rehabilitation for the exercise    Continue Psychosocial Services   No Follow up required       Education: Education Goals: Education classes will be provided on a weekly basis, covering required topics. Participant will state understanding/return demonstration of topics presented.  Learning Barriers/Preferences: Learning Barriers/Preferences - 06/08/19 1518      Learning Barriers/Preferences   Learning Barriers  None    Learning Preferences  Audio;Computer/Internet;Group Instruction;Individual Instruction;Pictoral;Skilled Demonstration;Verbal Instruction;Written Material;Video  Education Topics: Risk Factor Reduction:  -Group instruction that is supported by a PowerPoint presentation. Instructor discusses the definition of a risk factor, different risk factors for pulmonary disease, and how the heart and lungs work together.     Nutrition for Pulmonary Patient:  -Group instruction provided by PowerPoint slides, verbal discussion, and written materials to support subject matter. The instructor gives an explanation and review of healthy diet recommendations, which includes a discussion on weight management, recommendations for fruit and vegetable consumption, as well as protein, fluid, caffeine, fiber, sodium, sugar, and alcohol. Tips for eating when patients are short of breath are discussed.   PULMONARY REHAB OTHER RESPIRATORY from 07/06/2019 in [place]  Date  07/06/19      Pursed Lip Breathing:  -Group instruction that is supported by demonstration and informational handouts. Instructor discusses the benefits of pursed lip and diaphragmatic breathing and detailed demonstration on how to preform both.     Oxygen Safety:  -Group instruction provided by PowerPoint, verbal discussion,  and written material to support subject matter. There is an overview of "What is Oxygen" and "Why do we need it".  Instructor also reviews how to create a safe environment for oxygen use, the importance of using oxygen as prescribed, and the risks of noncompliance. There is a brief discussion on traveling with oxygen and resources the patient may utilize.   Oxygen Equipment:  -Group instruction provided by [facility] Staff utilizing handouts, written materials, and equipment demonstrations.   Signs and Symptoms:  -Group instruction provided by written material and verbal discussion to support subject matter. Warning signs and symptoms of infection, stroke, and heart attack are reviewed and when to call the physician/911 reinforced. Tips for preventing the spread of infection discussed.   Advanced Directives:  -Group instruction provided by verbal instruction and written material to support subject matter. Instructor reviews Advanced Directive laws and proper instruction for filling out document.   Pulmonary Video:  -Group video education that reviews the importance of medication and oxygen compliance, exercise, good nutrition, pulmonary hygiene, and pursed lip and diaphragmatic breathing for the pulmonary patient.   Exercise for the Pulmonary Patient:  -Group instruction that is supported by a PowerPoint presentation. Instructor discusses benefits of exercise, core components of exercise, frequency, duration, and intensity of an exercise routine, importance of utilizing pulse oximetry during exercise, safety while exercising, and options of places to exercise outside of rehab.     Pulmonary Medications:  -Verbally interactive group education provided by instructor with focus on inhaled medications and proper administration.   PULMONARY REHAB OTHER RESPIRATORY from 07/06/2019 in [place]  Date  07/01/19  Educator  -- [Handout]      Anatomy and  Physiology of the Respiratory System and Intimacy:  -Group instruction provided by PowerPoint, verbal discussion, and written material to support subject matter. Instructor reviews respiratory cycle and anatomical components of the respiratory system and their functions. Instructor also reviews differences in obstructive and restrictive respiratory diseases with examples of each. Intimacy, Sex, and Sexuality differences are reviewed with a discussion on how relationships can change when diagnosed with pulmonary disease. Common sexual concerns are reviewed.   MD DAY -A group question and answer session with a medical doctor that allows participants to ask questions that relate to their pulmonary disease state.   OTHER EDUCATION -Group or individual verbal, written, or video instructions that support the educational goals of the pulmonary rehab program.   [place]  Eating Survival Tips:  -Group instruction provided by PowerPoint slides, verbal discussion, and written materials to support subject matter. The instructor gives patients tips, tricks, and techniques to help them not only survive but enjoy the holidays despite the onslaught of food that accompanies the holidays.   Knowledge Questionnaire Score: Knowledge Questionnaire Score - 06/08/19 1519      Knowledge Questionnaire Score   Pre Score  9/18       Core Components/Risk Factors/Patient Goals at Admission: Personal Goals and Risk Factors at Admission - 06/28/19 1213      Core Components/Risk Factors/Patient Goals on Admission   Improve shortness of breath with ADL's  Yes    Intervention  Provide education, individualized exercise plan and daily activity instruction to help decrease symptoms of SOB with activities of daily living.    Expected Outcomes  Short Term: Improve cardiorespiratory fitness to achieve a reduction of symptoms when performing ADLs;Long Term: Be able to perform more ADLs without symptoms or delay the onset of  symptoms       Core Components/Risk Factors/Patient Goals Review:  Goals and Risk Factor Review    Row Name 06/28/19 1215 08/30/19 1406           Core Components/Risk Factors/Patient Goals Review   Personal Goals Review  Develop more efficient breathing techniques such as purse lipped breathing and diaphragmatic breathing and practicing self-pacing with activity.;Increase knowledge of respiratory medications and ability to use respiratory devices properly.;Improve shortness of breath with ADL's  Develop more efficient breathing techniques such as purse lipped breathing and diaphragmatic breathing and practicing self-pacing with activity.;Increase knowledge of respiratory medications and ability to use respiratory devices properly.;Improve shortness of breath with ADL's      Review  Fabiha just started the pulmonary rehab program, has attended 2 exercise sessions, too early to have met any program goals.  Department has been closed x 7 weeks, we will reopen 09/07/2019 and will continue working on admission goals.      Expected Outcomes  See admission goals.  See admission goals.         Core Components/Risk Factors/Patient Goals at Discharge (Final Review):  Goals and Risk Factor Review - 08/30/19 1406      Core Components/Risk Factors/Patient Goals Review   Personal Goals Review  Develop more efficient breathing techniques such as purse lipped breathing and diaphragmatic breathing and practicing self-pacing with activity.;Increase knowledge of respiratory medications and ability to use respiratory devices properly.;Improve shortness of breath with ADL's    Review  Department has been closed x 7 weeks, we will reopen 09/07/2019 and will continue working on admission goals.    Expected Outcomes  See admission goals.       ITP Comments:   Comments: ITP REVIEW Pt is making expected progress toward pulmonary rehab goals after completing 5 sessions. Recommend continued exercise, life style  modification, education, and utilization of breathing techniques to increase stamina and strength and decrease shortness of breath with exertion.

## 2019-09-01 ENCOUNTER — Other Ambulatory Visit: Payer: Self-pay | Admitting: Nurse Practitioner

## 2019-09-01 ENCOUNTER — Telehealth (HOSPITAL_COMMUNITY): Payer: Self-pay | Admitting: Nurse Practitioner

## 2019-09-02 ENCOUNTER — Ambulatory Visit (HOSPITAL_COMMUNITY): Payer: PPO

## 2019-09-02 DIAGNOSIS — Z1159 Encounter for screening for other viral diseases: Secondary | ICD-10-CM | POA: Diagnosis not present

## 2019-09-07 ENCOUNTER — Inpatient Hospital Stay (HOSPITAL_COMMUNITY)
Admission: RE | Admit: 2019-09-07 | Discharge: 2019-09-07 | Disposition: A | Discharge status: Home / Self Care | Payer: PPO | Source: Ambulatory Visit

## 2019-09-07 DIAGNOSIS — K635 Polyp of colon: Secondary | ICD-10-CM | POA: Diagnosis not present

## 2019-09-07 DIAGNOSIS — Z980 Intestinal bypass and anastomosis status: Secondary | ICD-10-CM | POA: Diagnosis not present

## 2019-09-07 DIAGNOSIS — I272 Pulmonary hypertension, unspecified: Secondary | ICD-10-CM

## 2019-09-07 DIAGNOSIS — D124 Benign neoplasm of descending colon: Secondary | ICD-10-CM | POA: Diagnosis not present

## 2019-09-07 DIAGNOSIS — Z85038 Personal history of other malignant neoplasm of large intestine: Secondary | ICD-10-CM | POA: Diagnosis not present

## 2019-09-07 DIAGNOSIS — K640 First degree hemorrhoids: Secondary | ICD-10-CM | POA: Diagnosis not present

## 2019-09-07 DIAGNOSIS — K5730 Diverticulosis of large intestine without perforation or abscess without bleeding: Secondary | ICD-10-CM | POA: Diagnosis not present

## 2019-09-07 LAB — HM COLONOSCOPY

## 2019-09-07 NOTE — Progress Notes (Signed)
Pulmonary Individual Treatment Plan  Patient Details  Name: Patricia Mata MRN: [id number] Date of Birth: [birth year] Referring Provider:     Pulmonary Rehab Walk Test from 06/08/2019 in [place] [facility]  Referring Provider  Dr. Haroldine Laws       Initial Encounter Date:    Pulmonary Rehab Walk Test from 06/08/2019 in [place]  Date  06/08/19      Visit Diagnosis: No diagnosis found.  Patient's Home Medications on Admission:   Current Outpatient Medications:  .  allopurinol (ZYLOPRIM) 100 MG tablet, Take 100 mg by mouth daily., Disp: , Rfl:  .  amiodarone (PACERONE) 200 MG tablet, TAKE 1 TABLET BY MOUTH EVERY DAY (Patient taking differently: Take 200 mg by mouth daily. ), Disp: 90 tablet, Rfl: 1 .  amLODipine (NORVASC) 10 MG tablet, Take 1 tablet (10 mg total) by mouth daily., Disp: 90 tablet, Rfl: 3 .  amoxicillin-clavulanate (AUGMENTIN) 875-125 MG tablet, Take 1 tablet by mouth every 12 (twelve) hours., Disp: 14 tablet, Rfl: 0 .  atorvastatin (LIPITOR) 40 MG tablet, TAKE 1 TABLET BY MOUTH EVERY DAY, Disp: 90 tablet, Rfl: 0 .  blood glucose meter kit and supplies, Dispense based on patient and insurance preference. Use up to four times daily as directed. (FOR ICD-10 E10.9, E11.9)., Disp: 1 each, Rfl: 3 .  Blood Glucose Monitoring Suppl (ONETOUCH ULTRALINK) w/Device KIT, Check blood sugars twice daily E11.9, Disp: 1 kit, Rfl: 0 .  colchicine 0.6 MG tablet, TAKE 1 TABLET BY MOUTH TWICE A DAY AS NEEDED FOR GOUT, Disp: 180 tablet, Rfl: 1 .  doxazosin (CARDURA) 1 MG tablet, Take 1 tablet (1 mg total) by mouth at bedtime., Disp: 30 tablet, Rfl: 6 .  furosemide (LASIX) 40 MG tablet, TAKE 1 TABLET BY MOUTH EVERY DAY (Patient taking differently: Take 40 mg by mouth daily. ), Disp: 90 tablet, Rfl: 1 .  glucose blood test strip, Use as instructed, Disp: 100 each, Rfl: 12 .  hydrALAZINE (APRESOLINE) 100 MG tablet, Take 1 tablet (100 mg total)  by mouth 3 (three) times daily., Disp: 180 tablet, Rfl: 5 .  insulin glargine (LANTUS) 100 unit/mL SOPN, Inject 0.15 mLs (15 Units total) into the skin daily. (Patient taking differently: Inject 5 Units into the skin 2 (two) times daily as needed (blood sugar over 150). ), Disp: , Rfl:  .  Insulin Pen Needle 29G X 5MM MISC, Use as directed, Disp: 200 each, Rfl: 0 .  JANUVIA 50 MG tablet, TAKE 1 TABLET BY MOUTH EVERY DAY (Patient taking differently: Take 50 mg by mouth daily. ), Disp: 90 tablet, Rfl: 0 .  Lancets (ONETOUCH ULTRASOFT) lancets, Use as instructed to check blood sugars twice daily E11.9, Disp: 100 each, Rfl: 12 .  latanoprost (XALATAN) 0.005 % ophthalmic solution, Place 1 drop into both eyes at bedtime. , Disp: , Rfl:  .  levothyroxine (SYNTHROID) 88 MCG tablet, Take 1 tablet (88 mcg total) by mouth daily., Disp: 30 tablet, Rfl: 2 .  metoprolol succinate (TOPROL-XL) 25 MG 24 hr tablet, Take 1 tablet (25 mg total) by mouth daily. STOP taking this unless heart rate >100bpm OR directed by your doctor (Patient not taking: Reported on 08/05/2019), Disp: 30 tablet, Rfl: 5 .  Multiple Vitamins-Minerals (CENTRUM SILVER 50+WOMEN) TABS, Take 1 tablet by mouth daily., Disp: , Rfl:  .  oxymetazoline (AFRIN) 0.05 % nasal spray, Place 1 spray into both nostrils 2 (two) times daily as needed for congestion., Disp: ,  Rfl:  .  sildenafil (REVATIO) 20 MG tablet, Take 1 tablet (20 mg total) by mouth 3 (three) times daily., Disp: 270 tablet, Rfl: 3 No current facility-administered medications for this encounter.  Facility-Administered Medications Ordered in Other Encounters:  .  sodium chloride flush (NS) 0.9 % injection 10 mL, 10 mL, Intravenous, PRN, Alen Blew, Mathis Dad, MD  Past Medical History: Past Medical History:  Diagnosis Date  . Acute kidney injury (nontraumatic) ([place])   . Acute renal failure (ARF) ([place]) 06/08/2018  . Cancer [facility]) 2008   Colon   . Diabetes mellitus without complication ([place])   .  Gout 09/08/2018  . Hypertension   . Macrocytic anemia     Tobacco Use: Social History   Tobacco Use  Smoking Status Former Smoker  . Packs/day: 0.25  . Years: 20.00  . Pack years: 5.00  . Types: Cigarettes  . Quit date: 01/31/2001  . 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[employer] Cardiac and Pulmonary Rehab Latest Ref Rng & Units 04/19/2019 04/23/2019 04/23/2019 04/23/2019 06/22/2019   Cholestrol 0 - 200 mg/dL - - - - -   LDLCALC 0 - 99 mg/dL - - - - -   HDL >40 mg/dL - - - - -   Trlycerides <150 mg/dL - - - - -   Hemoglobin A1c 4.8 - 5.6 % 6.5(H) - - - 6.0(H)   PHART 7.350 - 7.450 - - - - -   PCO2ART 32.0 - 48.0 mmHg - - - - -   HCO3 20.0 - 28.0 mmol/L - 22.7 23.8 22.9 -   TCO2 22 - 32 mmol/L - _0 -   ACIDBASEDEF 0.0 - 2.0 mmol/L - 2.0 1.0 2.0 -   O2SAT % - 73.0 75.0 77.0 -      Capillary Blood Glucose: Lab Results  Component Value Date   GLUCAP 159 (H) 04/23/2019   GLUCAP 218 (H) 03/23/2019   GLUCAP 365 (H) 03/23/2019   GLUCAP 396 (H) 03/22/2019   GLUCAP 242 (H) 03/22/2019     Pulmonary Assessment Scores: Pulmonary Assessment Scores    Row Name 06/08/19 1515 06/08/19 1551       ADL UCSD   ADL Phase  Entry  Entry    SOB Score total  12  -      CAT Score   CAT Score  10  -      mMRC Score   mMRC Score  -  3      UCSD: Self-administered rating of dyspnea associated with activities of daily living (ADLs) 6-point scale (0 = "not at all" to 5 = "maximal or unable to do because of breathlessness")  Scoring Scores range from 0 to 120.  Minimally important difference is 5 units  CAT: CAT can identify the health impairment of COPD patients and is better correlated with disease progression.  CAT has a scoring range of zero to 40. The CAT score is classified into four groups of low (less than 10), medium (10 - 20), high (21-30) and very high (31-40) based on the impact level of disease on  health status. A CAT score over 10 suggests significant symptoms.  A worsening CAT score could be explained by an exacerbation, poor medication adherence, poor inhaler technique, or progression of COPD or comorbid conditions.  CAT MCID is 2 points  mMRC: mMRC (Modified Medical Research Council) Dyspnea Scale is used to  assess the degree of baseline functional disability in patients of respiratory disease due to dyspnea. No minimal important difference is established. A decrease in score of 1 point or greater is considered a positive change.   Pulmonary Function Assessment: Pulmonary Function Assessment - 06/08/19 1515      Breath   Bilateral Breath Sounds  Clear    Shortness of Breath  No       Exercise Target Goals: Exercise Program Goal: Individual exercise prescription set using results from initial 6 min walk test and THRR while considering  patient's activity barriers and safety.   Exercise Prescription Goal: Initial exercise prescription builds to 30-45 minutes a day of aerobic activity, 2-3 days per week.  Home exercise guidelines will be given to patient during program as part of exercise prescription that the participant will acknowledge.  Activity Barriers & Risk Stratification: Activity Barriers & Cardiac Risk Stratification - 06/08/19 1504      Activity Barriers & Cardiac Risk Stratification   Activity Barriers  Arthritis;Deconditioning;Muscular Weakness       6 Minute Walk: 6 Minute Walk    Row Name 06/08/19 1552         6 Minute Walk   Phase  Initial     Distance  500 feet     Walk Time  4.08 minutes     # of Rest Breaks  3     MPH  0.95     METS  1.98     RPE  13     Perceived Dyspnea   0     VO2 Peak  6.93     Symptoms  Yes (comment)     Comments  3 seated rest breaks 30 sec, 30 sec, 55 sec all due to leg fatigue     Resting HR  71 bpm     Resting BP  144/56     Resting Oxygen Saturation   100 %     Exercise Oxygen Saturation  during 6 min walk  99  %     Max Ex. HR  100 bpm     Max Ex. BP  184/60     2 Minute Post BP  156/48       Interval HR   1 Minute HR  94     2 Minute HR  96     3 Minute HR  95     4 Minute HR  91     5 Minute HR  100     6 Minute HR  100     2 Minute Post HR  79     Interval Heart Rate?  Yes       Interval Oxygen   Interval Oxygen?  Yes     Baseline Oxygen Saturation %  100 %     1 Minute Oxygen Saturation %  99 %     1 Minute Liters of Oxygen  0 L     2 Minute Oxygen Saturation %  100 %     2 Minute Liters of Oxygen  0 L     3 Minute Oxygen Saturation %  99 %     3 Minute Liters of Oxygen  0 L     4 Minute Oxygen Saturation %  99 %     4 Minute Liters of Oxygen  0 L     5 Minute Oxygen Saturation %  99 %     5 Minute Liters of Oxygen  0 L     6 Minute Oxygen Saturation %  99 %     6 Minute Liters of Oxygen  0 L     2 Minute Post Oxygen Saturation %  100 %     2 Minute Post Liters of Oxygen  0 L        Oxygen Initial Assessment: Oxygen Initial Assessment - 06/08/19 1551      Home Oxygen   Home Oxygen Device  None    Sleep Oxygen Prescription  None    Home Exercise Oxygen Prescription  None    Home at Rest Exercise Oxygen Prescription  None    Compliance with Home Oxygen Use  Yes      Initial 6 min Walk   Oxygen Used  None      Program Oxygen Prescription   Program Oxygen Prescription  None      Intervention   Short Term Goals  To learn and exhibit compliance with exercise, home and travel O2 prescription;To learn and understand importance of monitoring SPO2 with pulse oximeter and demonstrate accurate use of the pulse oximeter.;To learn and understand importance of maintaining oxygen saturations>88%;To learn and demonstrate proper pursed lip breathing techniques or other breathing techniques.;To learn and demonstrate proper use of respiratory medications    Long  Term Goals  Exhibits compliance with exercise, home and travel O2 prescription;Maintenance of O2  saturations>88%;Verbalizes importance of monitoring SPO2 with pulse oximeter and return demonstration;Exhibits proper breathing techniques, such as pursed lip breathing or other method taught during program session;Compliance with respiratory medication       Oxygen Re-Evaluation: Oxygen Re-Evaluation    Row Name 06/28/19 1018 08/31/19 0906           Program Oxygen Prescription   Program Oxygen Prescription  None  None        Home Oxygen   Home Oxygen Device  None  None      Sleep Oxygen Prescription  None  None      Home Exercise Oxygen Prescription  None  None      Home at Rest Exercise Oxygen Prescription  None  None      Compliance with Home Oxygen Use  Yes  Yes        Goals/Expected Outcomes   Short Term Goals  To learn and exhibit compliance with exercise, home and travel O2 prescription;To learn and understand importance of monitoring SPO2 with pulse oximeter and demonstrate accurate use of the pulse oximeter.;To learn and understand importance of maintaining oxygen saturations>88%;To learn and demonstrate proper pursed lip breathing techniques or other breathing techniques.;To learn and demonstrate proper use of respiratory medications  To learn and exhibit compliance with exercise, home and travel O2 prescription;To learn and understand importance of monitoring SPO2 with pulse oximeter and demonstrate accurate use of the pulse oximeter.;To learn and understand importance of maintaining oxygen saturations>88%;To learn and demonstrate proper pursed lip breathing techniques or other breathing techniques.;To learn and demonstrate proper use of respiratory medications      Long  Term Goals  Exhibits compliance with exercise, home and travel O2 prescription;Maintenance of O2 saturations>88%;Verbalizes importance of monitoring SPO2 with pulse oximeter and return demonstration;Exhibits proper breathing techniques, such as pursed lip breathing or other method taught during program  session;Compliance with respiratory medication  Exhibits compliance with exercise, home and travel O2 prescription;Maintenance of O2 saturations>88%;Verbalizes importance of monitoring SPO2 with pulse oximeter and return demonstration;Exhibits proper breathing techniques, such as pursed lip breathing or other method taught  during program session;Compliance with respiratory medication      Goals/Expected Outcomes  compliance  compliance         Oxygen Discharge (Final Oxygen Re-Evaluation): Oxygen Re-Evaluation - 08/31/19 0906      Program Oxygen Prescription   Program Oxygen Prescription  None      Home Oxygen   Home Oxygen Device  None    Sleep Oxygen Prescription  None    Home Exercise Oxygen Prescription  None    Home at Rest Exercise Oxygen Prescription  None    Compliance with Home Oxygen Use  Yes      Goals/Expected Outcomes   Short Term Goals  To learn and exhibit compliance with exercise, home and travel O2 prescription;To learn and understand importance of monitoring SPO2 with pulse oximeter and demonstrate accurate use of the pulse oximeter.;To learn and understand importance of maintaining oxygen saturations>88%;To learn and demonstrate proper pursed lip breathing techniques or other breathing techniques.;To learn and demonstrate proper use of respiratory medications    Long  Term Goals  Exhibits compliance with exercise, home and travel O2 prescription;Maintenance of O2 saturations>88%;Verbalizes importance of monitoring SPO2 with pulse oximeter and return demonstration;Exhibits proper breathing techniques, such as pursed lip breathing or other method taught during program session;Compliance with respiratory medication    Goals/Expected Outcomes  compliance       Initial Exercise Prescription: Initial Exercise Prescription - 06/08/19 1500      Date of Initial Exercise RX and Referring Provider   Date  06/08/19    Referring Provider  Dr. Haroldine Laws       NuStep   Level   2    SPM  80    Minutes  30      Prescription Details   Frequency (times per week)  2    Duration  Progress to 30 minutes of continuous aerobic without signs/symptoms of physical distress      Intensity   THRR 40-80% of Max Heartrate  62-125    Ratings of Perceived Exertion  11-13    Perceived Dyspnea  0-4      Progression   Progression  Continue progressive overload as per policy without signs/symptoms or physical distress.      Resistance Training   Training Prescription  Yes    Weight  orange bands    Reps  10-15       Perform Capillary Blood Glucose checks as needed.  Exercise Prescription Changes: Exercise Prescription Changes    Row Name 06/29/19 1500 07/06/19 1447           Response to Exercise   Blood Pressure (Admit)  160/60  140/70      Blood Pressure (Exercise)  130/60  150/54      Blood Pressure (Exit)  130/54  120/62      Heart Rate (Admit)  75 bpm  60 bpm      Heart Rate (Exercise)  68 bpm  71 bpm      Heart Rate (Exit)  63 bpm  62 bpm      Oxygen Saturation (Admit)  100 %  100 %      Oxygen Saturation (Exercise)  99 %  100 %      Oxygen Saturation (Exit)  100 %  100 %      Rating of Perceived Exertion (Exercise)  11  13      Perceived Dyspnea (Exercise)  0  0      Duration  Continue with 30 min  of aerobic exercise without signs/symptoms of physical distress.  Continue with 30 min of aerobic exercise without signs/symptoms of physical distress.      Intensity  THRR unchanged  THRR unchanged        Resistance Training   Training Prescription  Yes  Yes      Weight  orange bands  orange bands      Reps  10-15  10-15      Time  10 Minutes  10 Minutes        NuStep   Level  2  2      SPM  80  80      Minutes  30  30      METs  1.7  1.8         Exercise Comments:   Exercise Goals and Review: Exercise Goals    Row Name 06/08/19 1558 06/28/19 1018 08/31/19 0906         Exercise Goals   Increase Physical Activity  Yes  Yes  Yes      Intervention  Provide advice, education, support and counseling about physical activity/exercise needs.;Develop an individualized exercise prescription for aerobic and resistive training based on initial evaluation findings, risk stratification, comorbidities and participant's personal goals.  Provide advice, education, support and counseling about physical activity/exercise needs.;Develop an individualized exercise prescription for aerobic and resistive training based on initial evaluation findings, risk stratification, comorbidities and participant's personal goals.  Provide advice, education, support and counseling about physical activity/exercise needs.;Develop an individualized exercise prescription for aerobic and resistive training based on initial evaluation findings, risk stratification, comorbidities and participant's personal goals.     Expected Outcomes  Short Term: Attend rehab on a regular basis to increase amount of physical activity.;Long Term: Add in home exercise to make exercise part of routine and to increase amount of physical activity.;Long Term: Exercising regularly at least 3-5 days a week.  Short Term: Attend rehab on a regular basis to increase amount of physical activity.;Long Term: Add in home exercise to make exercise part of routine and to increase amount of physical activity.;Long Term: Exercising regularly at least 3-5 days a week.  Short Term: Attend rehab on a regular basis to increase amount of physical activity.;Long Term: Add in home exercise to make exercise part of routine and to increase amount of physical activity.;Long Term: Exercising regularly at least 3-5 days a week.     Increase Strength and Stamina  Yes  Yes  Yes     Intervention  Provide advice, education, support and counseling about physical activity/exercise needs.;Develop an individualized exercise prescription for aerobic and resistive training based on initial evaluation findings, risk stratification,  comorbidities and participant's personal goals.  Provide advice, education, support and counseling about physical activity/exercise needs.;Develop an individualized exercise prescription for aerobic and resistive training based on initial evaluation findings, risk stratification, comorbidities and participant's personal goals.  Provide advice, education, support and counseling about physical activity/exercise needs.;Develop an individualized exercise prescription for aerobic and resistive training based on initial evaluation findings, risk stratification, comorbidities and participant's personal goals.     Expected Outcomes  Short Term: Increase workloads from initial exercise prescription for resistance, speed, and METs.;Short Term: Perform resistance training exercises routinely during rehab and add in resistance training at home;Long Term: Improve cardiorespiratory fitness, muscular endurance and strength as measured by increased METs and functional capacity (6MWT)  Short Term: Increase workloads from initial exercise prescription for resistance, speed, and METs.;Short Term: Perform resistance training exercises  routinely during rehab and add in resistance training at home;Long Term: Improve cardiorespiratory fitness, muscular endurance and strength as measured by increased METs and functional capacity (6MWT)  Short Term: Increase workloads from initial exercise prescription for resistance, speed, and METs.;Short Term: Perform resistance training exercises routinely during rehab and add in resistance training at home;Long Term: Improve cardiorespiratory fitness, muscular endurance and strength as measured by increased METs and functional capacity (6MWT)     Able to understand and use rate of perceived exertion (RPE) scale  Yes  Yes  Yes     Intervention  Provide education and explanation on how to use RPE scale  Provide education and explanation on how to use RPE scale  Provide education and explanation on how  to use RPE scale     Expected Outcomes  Short Term: Able to use RPE daily in rehab to express subjective intensity level;Long Term:  Able to use RPE to guide intensity level when exercising independently  Short Term: Able to use RPE daily in rehab to express subjective intensity level;Long Term:  Able to use RPE to guide intensity level when exercising independently  Short Term: Able to use RPE daily in rehab to express subjective intensity level;Long Term:  Able to use RPE to guide intensity level when exercising independently     Able to understand and use Dyspnea scale  Yes  Yes  Yes     Intervention  Provide education and explanation on how to use Dyspnea scale  Provide education and explanation on how to use Dyspnea scale  Provide education and explanation on how to use Dyspnea scale     Expected Outcomes  Short Term: Able to use Dyspnea scale daily in rehab to express subjective sense of shortness of breath during exertion;Long Term: Able to use Dyspnea scale to guide intensity level when exercising independently  Short Term: Able to use Dyspnea scale daily in rehab to express subjective sense of shortness of breath during exertion;Long Term: Able to use Dyspnea scale to guide intensity level when exercising independently  Short Term: Able to use Dyspnea scale daily in rehab to express subjective sense of shortness of breath during exertion;Long Term: Able to use Dyspnea scale to guide intensity level when exercising independently     Knowledge and understanding of Target Heart Rate Range (THRR)  Yes  Yes  Yes     Intervention  -  Provide education and explanation of THRR including how the numbers were predicted and where they are located for reference  Provide education and explanation of THRR including how the numbers were predicted and where they are located for reference     Expected Outcomes  Short Term: Able to state/look up THRR;Short Term: Able to use daily as guideline for intensity in  rehab;Long Term: Able to use THRR to govern intensity when exercising independently  Short Term: Able to state/look up THRR;Short Term: Able to use daily as guideline for intensity in rehab;Long Term: Able to use THRR to govern intensity when exercising independently  Short Term: Able to state/look up THRR;Short Term: Able to use daily as guideline for intensity in rehab;Long Term: Able to use THRR to govern intensity when exercising independently     Understanding of Exercise Prescription  Yes  Yes  Yes     Intervention  Provide education, explanation, and written materials on patient's individual exercise prescription  Provide education, explanation, and written materials on patient's individual exercise prescription  Provide education, explanation, and written materials on  patient's individual exercise prescription     Expected Outcomes  Short Term: Able to explain program exercise prescription;Long Term: Able to explain home exercise prescription to exercise independently  Short Term: Able to explain program exercise prescription;Long Term: Able to explain home exercise prescription to exercise independently  Short Term: Able to explain program exercise prescription;Long Term: Able to explain home exercise prescription to exercise independently        Exercise Goals Re-Evaluation : Exercise Goals Re-Evaluation    Row Name 06/28/19 1019 08/31/19 0907           Exercise Goal Re-Evaluation   Exercise Goals Review  Increase Physical Activity;Increase Strength and Stamina;Able to understand and use rate of perceived exertion (RPE) scale;Able to understand and use Dyspnea scale;Knowledge and understanding of Target Heart Rate Range (THRR);Understanding of Exercise Prescription  Increase Physical Activity;Increase Strength and Stamina;Able to understand and use rate of perceived exertion (RPE) scale;Able to understand and use Dyspnea scale;Knowledge and understanding of Target Heart Rate Range  (THRR);Understanding of Exercise Prescription      Comments  Pt has completed 2 exercise sessions. I will be working with pt to build up her leg strength and muscle stamina. Pt is currently limited by both. Pt shows a positive outlook and is eager to work hard. Pt currently exercises at 1.8 METs on the stepper. Will continue to monitor and progress as able.  It is unknown if pt has exercised since we ceased in person exercise, as she did not wish to be called. Pt will return 2/23. Will monitor and progress as able.      Expected Outcomes  Through exercise at rehab and at home, the patient will decrease shortness of breath with daily activities and feel confident in carrying out an exercise regime at home.  Through exercise at rehab and at home, the patient will decrease shortness of breath with daily activities and feel confident in carrying out an exercise regime at home.         Discharge Exercise Prescription (Final Exercise Prescription Changes): Exercise Prescription Changes - 07/06/19 1447      Response to Exercise   Blood Pressure (Admit)  140/70    Blood Pressure (Exercise)  150/54    Blood Pressure (Exit)  120/62    Heart Rate (Admit)  60 bpm    Heart Rate (Exercise)  71 bpm    Heart Rate (Exit)  62 bpm    Oxygen Saturation (Admit)  100 %    Oxygen Saturation (Exercise)  100 %    Oxygen Saturation (Exit)  100 %    Rating of Perceived Exertion (Exercise)  13    Perceived Dyspnea (Exercise)  0    Duration  Continue with 30 min of aerobic exercise without signs/symptoms of physical distress.    Intensity  THRR unchanged      Resistance Training   Training Prescription  Yes    Weight  orange bands    Reps  10-15    Time  10 Minutes      NuStep   Level  2    SPM  80    Minutes  30    METs  1.8       Nutrition:  Target Goals: Understanding of nutrition guidelines, daily intake of sodium <1563m, cholesterol <2066m calories 30% from fat and 7% or less from saturated fats,  daily to have 5 or more servings of fruits and vegetables.  Biometrics: Pre Biometrics - 06/08/19 1505  Pre Biometrics   Height  _0  (1.626 m)    Weight  82.2 kg    BMI (Calculated)  31.09    Grip Strength  21 kg        Nutrition Therapy Plan and Nutrition Goals: Nutrition Therapy & Goals - 06/29/19 1348      Nutrition Therapy   Diet  Therapeutic Lifestyle Changes    Drug/Food Interactions  Statins/Certain Fruits      Personal Nutrition Goals   Nutrition Goal  Pt to build a healthy plate including vegetables, fruits, whole grains, and low-fat dairy products in a heart healthy meal plan.    Personal Goal #2  CBG concentrations in the normal range or as close to normal as is safely possible.      Intervention Plan   Intervention  Prescribe, educate and counsel regarding individualized specific dietary modifications aiming towards targeted core components such as weight, hypertension, lipid management, diabetes, heart failure and other comorbidities.;Nutrition handout(s) given to patient.    Expected Outcomes  Short Term Goal: Understand basic principles of dietary content, such as calories, fat, sodium, cholesterol and nutrients.;Long Term Goal: Adherence to prescribed nutrition plan.       Nutrition Assessments: Nutrition Assessments - 06/29/19 1406      Rate Your Plate Scores   Pre Score  47       Nutrition Goals Re-Evaluation: Nutrition Goals Re-Evaluation    Row Name 06/29/19 1406             Goals   Current Weight  181 lb (82.1 kg)       Nutrition Goal  Pt to build a healthy plate including vegetables, fruits, whole grains, and low-fat dairy products in a heart healthy meal plan.         Personal Goal #2 Re-Evaluation   Personal Goal #2  CBG concentrations in the normal range or as close to normal as is safely possible.          Nutrition Goals Discharge (Final Nutrition Goals Re-Evaluation): Nutrition Goals Re-Evaluation - 06/29/19 1406      Goals    Current Weight  181 lb (82.1 kg)    Nutrition Goal  Pt to build a healthy plate including vegetables, fruits, whole grains, and low-fat dairy products in a heart healthy meal plan.      Personal Goal #2 Re-Evaluation   Personal Goal #2  CBG concentrations in the normal range or as close to normal as is safely possible.       Psychosocial: Target Goals: Acknowledge presence or absence of significant depression and/or stress, maximize coping skills, provide positive support system. Participant is able to verbalize types and ability to use techniques and skills needed for reducing stress and depression.  Initial Review & Psychosocial Screening: Initial Psych Review & Screening - 06/08/19 1517      Initial Review   Current issues with  None Identified      Family Dynamics   Good Support System?  Yes      Barriers   Psychosocial barriers to participate in program  There are no identifiable barriers or psychosocial needs.      Screening Interventions   Interventions  Encouraged to exercise       Quality of Life Scores:  Scores of 19 and below usually indicate a poorer quality of life in these areas.  A difference of  2-3 points is a clinically meaningful difference.  A difference of 2-3 points in the total score  of the Quality of Life Index has been associated with significant improvement in overall quality of life, self-image, physical symptoms, and general health in studies assessing change in quality of life.  PHQ-9: Recent Review Flowsheet Data    Depression screen [facility] 2/9 06/22/2019 06/08/2019 12/09/2018 09/09/2018   Decreased Interest 0 0 0 0   Down, Depressed, Hopeless 0 0 0 0   PHQ - 2 Score 0 0 0 0   Altered sleeping - 0 - -   Tired, decreased energy - 0 - -   Change in appetite - 0 - -   Feeling bad or failure about yourself  - 0 - -   Trouble concentrating - 0 - -   Moving slowly or fidgety/restless - 0 - -   Suicidal thoughts - 0 - -   PHQ-9 Score - 0 - -    Difficult doing work/chores - Not difficult at all - -     Interpretation of Total Score  Total Score Depression Severity:  1-4 = Minimal depression, 5-9 = Mild depression, 10-14 = Moderate depression, 15-19 = Moderately severe depression, 20-27 = Severe depression   Psychosocial Evaluation and Intervention: Psychosocial Evaluation - 06/08/19 1517      Psychosocial Evaluation & Interventions   Interventions  Encouraged to exercise with the program and follow exercise prescription    Continue Psychosocial Services   No Follow up required       Psychosocial Re-Evaluation: Psychosocial Re-Evaluation    Row Name 06/08/19 1518 06/28/19 1212 08/30/19 1403         Psychosocial Re-Evaluation   Current issues with  None Identified  None Identified  None Identified     Comments  -  No psychosocial concerns identified at this point, Brad just started the program and has attended 2 exercise sessions.  No psyco-social concerns identified after talking with patient on the phone, department has been closed x 7 weeks and will reopen for in person exercise 09/07/2019, she is excited to return.     Expected Outcomes  -  That Marka will experience no barriers or psychosocial concerns while in pulmonary rehab.  No psychosocial concerns while in program.     Interventions  Encouraged to attend Pulmonary Rehabilitation for the exercise  Encouraged to attend Pulmonary Rehabilitation for the exercise  Encouraged to attend Pulmonary Rehabilitation for the exercise     Continue Psychosocial Services   No Follow up required  No Follow up required  No Follow up required        Psychosocial Discharge (Final Psychosocial Re-Evaluation): Psychosocial Re-Evaluation - 08/30/19 1403      Psychosocial Re-Evaluation   Current issues with  None Identified    Comments  No psyco-social concerns identified after talking with patient on the phone, department has been closed x 7 weeks and will reopen for in person  exercise 09/07/2019, she is excited to return.    Expected Outcomes  No psychosocial concerns while in program.    Interventions  Encouraged to attend Pulmonary Rehabilitation for the exercise    Continue Psychosocial Services   No Follow up required       Education: Education Goals: Education classes will be provided on a weekly basis, covering required topics. Participant will state understanding/return demonstration of topics presented.  Learning Barriers/Preferences: Learning Barriers/Preferences - 06/08/19 1518      Learning Barriers/Preferences   Learning Barriers  None    Learning Preferences  Audio;Computer/Internet;Group Instruction;Individual Instruction;Pictoral;Skilled Demonstration;Verbal Instruction;Written Material;Video  Education Topics: Risk Factor Reduction:  -Group instruction that is supported by a PowerPoint presentation. Instructor discusses the definition of a risk factor, different risk factors for pulmonary disease, and how the heart and lungs work together.     Nutrition for Pulmonary Patient:  -Group instruction provided by PowerPoint slides, verbal discussion, and written materials to support subject matter. The instructor gives an explanation and review of healthy diet recommendations, which includes a discussion on weight management, recommendations for fruit and vegetable consumption, as well as protein, fluid, caffeine, fiber, sodium, sugar, and alcohol. Tips for eating when patients are short of breath are discussed.   PULMONARY REHAB OTHER RESPIRATORY from 07/06/2019 in [place]  Date  07/06/19      Pursed Lip Breathing:  -Group instruction that is supported by demonstration and informational handouts. Instructor discusses the benefits of pursed lip and diaphragmatic breathing and detailed demonstration on how to preform both.     Oxygen Safety:  -Group instruction provided by PowerPoint, verbal discussion,  and written material to support subject matter. There is an overview of "What is Oxygen" and "Why do we need it".  Instructor also reviews how to create a safe environment for oxygen use, the importance of using oxygen as prescribed, and the risks of noncompliance. There is a brief discussion on traveling with oxygen and resources the patient may utilize.   Oxygen Equipment:  -Group instruction provided by [facility] Staff utilizing handouts, written materials, and equipment demonstrations.   Signs and Symptoms:  -Group instruction provided by written material and verbal discussion to support subject matter. Warning signs and symptoms of infection, stroke, and heart attack are reviewed and when to call the physician/911 reinforced. Tips for preventing the spread of infection discussed.   Advanced Directives:  -Group instruction provided by verbal instruction and written material to support subject matter. Instructor reviews Advanced Directive laws and proper instruction for filling out document.   Pulmonary Video:  -Group video education that reviews the importance of medication and oxygen compliance, exercise, good nutrition, pulmonary hygiene, and pursed lip and diaphragmatic breathing for the pulmonary patient.   Exercise for the Pulmonary Patient:  -Group instruction that is supported by a PowerPoint presentation. Instructor discusses benefits of exercise, core components of exercise, frequency, duration, and intensity of an exercise routine, importance of utilizing pulse oximetry during exercise, safety while exercising, and options of places to exercise outside of rehab.     Pulmonary Medications:  -Verbally interactive group education provided by instructor with focus on inhaled medications and proper administration.   PULMONARY REHAB OTHER RESPIRATORY from 07/06/2019 in [place]  Date  07/01/19  Educator  -- [Handout]      Anatomy and  Physiology of the Respiratory System and Intimacy:  -Group instruction provided by PowerPoint, verbal discussion, and written material to support subject matter. Instructor reviews respiratory cycle and anatomical components of the respiratory system and their functions. Instructor also reviews differences in obstructive and restrictive respiratory diseases with examples of each. Intimacy, Sex, and Sexuality differences are reviewed with a discussion on how relationships can change when diagnosed with pulmonary disease. Common sexual concerns are reviewed.   MD DAY -A group question and answer session with a medical doctor that allows participants to ask questions that relate to their pulmonary disease state.   OTHER EDUCATION -Group or individual verbal, written, or video instructions that support the educational goals of the pulmonary rehab program.   [place]  Eating Survival Tips:  -Group instruction provided by PowerPoint slides, verbal discussion, and written materials to support subject matter. The instructor gives patients tips, tricks, and techniques to help them not only survive but enjoy the holidays despite the onslaught of food that accompanies the holidays.   Knowledge Questionnaire Score: Knowledge Questionnaire Score - 06/08/19 1519      Knowledge Questionnaire Score   Pre Score  9/18       Core Components/Risk Factors/Patient Goals at Admission: Personal Goals and Risk Factors at Admission - 06/28/19 1213      Core Components/Risk Factors/Patient Goals on Admission   Improve shortness of breath with ADL's  Yes    Intervention  Provide education, individualized exercise plan and daily activity instruction to help decrease symptoms of SOB with activities of daily living.    Expected Outcomes  Short Term: Improve cardiorespiratory fitness to achieve a reduction of symptoms when performing ADLs;Long Term: Be able to perform more ADLs without symptoms or delay the onset of  symptoms       Core Components/Risk Factors/Patient Goals Review:  Goals and Risk Factor Review    Row Name 06/28/19 1215 08/30/19 1406           Core Components/Risk Factors/Patient Goals Review   Personal Goals Review  Develop more efficient breathing techniques such as purse lipped breathing and diaphragmatic breathing and practicing self-pacing with activity.;Increase knowledge of respiratory medications and ability to use respiratory devices properly.;Improve shortness of breath with ADL's  Develop more efficient breathing techniques such as purse lipped breathing and diaphragmatic breathing and practicing self-pacing with activity.;Increase knowledge of respiratory medications and ability to use respiratory devices properly.;Improve shortness of breath with ADL's      Review  Fabiha just started the pulmonary rehab program, has attended 2 exercise sessions, too early to have met any program goals.  Department has been closed x 7 weeks, we will reopen 09/07/2019 and will continue working on admission goals.      Expected Outcomes  See admission goals.  See admission goals.         Core Components/Risk Factors/Patient Goals at Discharge (Final Review):  Goals and Risk Factor Review - 08/30/19 1406      Core Components/Risk Factors/Patient Goals Review   Personal Goals Review  Develop more efficient breathing techniques such as purse lipped breathing and diaphragmatic breathing and practicing self-pacing with activity.;Increase knowledge of respiratory medications and ability to use respiratory devices properly.;Improve shortness of breath with ADL's    Review  Department has been closed x 7 weeks, we will reopen 09/07/2019 and will continue working on admission goals.    Expected Outcomes  See admission goals.       ITP Comments:   Comments: ITP REVIEW Pt is making expected progress toward pulmonary rehab goals after completing 5 sessions. Recommend continued exercise, life style  modification, education, and utilization of breathing techniques to increase stamina and strength and decrease shortness of breath with exertion.

## 2019-09-08 ENCOUNTER — Telehealth (HOSPITAL_COMMUNITY): Payer: Self-pay | Admitting: Nurse Practitioner

## 2019-09-09 ENCOUNTER — Inpatient Hospital Stay (HOSPITAL_BASED_OUTPATIENT_CLINIC_OR_DEPARTMENT_OTHER): Payer: PPO | Admitting: Oncology

## 2019-09-09 ENCOUNTER — Ambulatory Visit (HOSPITAL_COMMUNITY): Payer: PPO

## 2019-09-09 ENCOUNTER — Inpatient Hospital Stay: Payer: PPO

## 2019-09-09 ENCOUNTER — Other Ambulatory Visit: Payer: Self-pay

## 2019-09-09 VITALS — BP 142/57 | HR 88 | Temp 99.1°F | Resp 18 | Ht 64.0 in | Wt 189.8 lb

## 2019-09-09 VITALS — BP 140/44 | HR 66 | Temp 98.5°F | Resp 16

## 2019-09-09 DIAGNOSIS — D469 Myelodysplastic syndrome, unspecified: Secondary | ICD-10-CM

## 2019-09-09 DIAGNOSIS — D631 Anemia in chronic kidney disease: Secondary | ICD-10-CM

## 2019-09-09 DIAGNOSIS — D464 Refractory anemia, unspecified: Secondary | ICD-10-CM | POA: Diagnosis not present

## 2019-09-09 DIAGNOSIS — N189 Chronic kidney disease, unspecified: Secondary | ICD-10-CM

## 2019-09-09 LAB — CBC WITH DIFFERENTIAL (CANCER CENTER ONLY)
Abs Immature Granulocytes: 0.33 10*3/uL — ABNORMAL HIGH (ref 0.00–0.07)
Basophils Absolute: 0 10*3/uL (ref 0.0–0.1)
Basophils Relative: 0 %
Eosinophils Absolute: 0.1 10*3/uL (ref 0.0–0.5)
Eosinophils Relative: 0 %
HCT: 21.9 % — ABNORMAL LOW (ref 36.0–46.0)
Hemoglobin: 7.4 g/dL — ABNORMAL LOW (ref 12.0–15.0)
Immature Granulocytes: 2 %
Lymphocytes Relative: 4 %
Lymphs Abs: 0.8 10*3/uL (ref 0.7–4.0)
MCH: 30.2 pg (ref 26.0–34.0)
MCHC: 33.8 g/dL (ref 30.0–36.0)
MCV: 89.4 fL (ref 80.0–100.0)
Monocytes Absolute: 1.8 10*3/uL — ABNORMAL HIGH (ref 0.1–1.0)
Monocytes Relative: 9 %
Neutro Abs: 16 10*3/uL — ABNORMAL HIGH (ref 1.7–7.7)
Neutrophils Relative %: 85 %
Platelet Count: 157 10*3/uL (ref 150–400)
RBC: 2.45 MIL/uL — ABNORMAL LOW (ref 3.87–5.11)
RDW: 21.6 % — ABNORMAL HIGH (ref 11.5–15.5)
WBC Count: 19 10*3/uL — ABNORMAL HIGH (ref 4.0–10.5)
nRBC: 0.2 % (ref 0.0–0.2)

## 2019-09-09 LAB — CMP (CANCER CENTER ONLY)
ALT: 31 U/L (ref 0–44)
AST: 48 U/L — ABNORMAL HIGH (ref 15–41)
Albumin: 3.1 g/dL — ABNORMAL LOW (ref 3.5–5.0)
Alkaline Phosphatase: 188 U/L — ABNORMAL HIGH (ref 38–126)
Anion gap: 12 (ref 5–15)
BUN: 37 mg/dL — ABNORMAL HIGH (ref 8–23)
CO2: 19 mmol/L — ABNORMAL LOW (ref 22–32)
Calcium: 8.5 mg/dL — ABNORMAL LOW (ref 8.9–10.3)
Chloride: 107 mmol/L (ref 98–111)
Creatinine: 1.81 mg/dL — ABNORMAL HIGH (ref 0.44–1.00)
GFR, Est AFR Am: 34 mL/min — ABNORMAL LOW (ref >60)
GFR, Estimated: 29 mL/min — ABNORMAL LOW (ref >60)
Glucose, Bld: 212 mg/dL — ABNORMAL HIGH (ref 70–99)
Potassium: 3.5 mmol/L (ref 3.5–5.1)
Sodium: 138 mmol/L (ref 135–145)
Total Bilirubin: 0.8 mg/dL (ref 0.3–1.2)
Total Protein: 6.7 g/dL (ref 6.5–8.1)

## 2019-09-09 LAB — PREPARE RBC (CROSSMATCH)

## 2019-09-09 LAB — SAMPLE TO BLOOD BANK

## 2019-09-09 MED ORDER — DIPHENHYDRAMINE HCL 25 MG PO CAPS
25.0000 mg | ORAL_CAPSULE | Freq: Once | ORAL | Status: AC
  Administered 2019-09-09: 25 mg via ORAL

## 2019-09-09 MED ORDER — DIPHENHYDRAMINE HCL 25 MG PO CAPS
ORAL_CAPSULE | ORAL | Status: AC
  Filled 2019-09-09: qty 1

## 2019-09-09 MED ORDER — ACETAMINOPHEN 325 MG PO TABS
650.0000 mg | ORAL_TABLET | Freq: Once | ORAL | Status: AC
  Administered 2019-09-09: 650 mg via ORAL

## 2019-09-09 MED ORDER — FUROSEMIDE 10 MG/ML IJ SOLN
INTRAMUSCULAR | Status: AC
  Filled 2019-09-09: qty 2

## 2019-09-09 MED ORDER — SODIUM CHLORIDE 0.9% IV SOLUTION
250.0000 mL | Freq: Once | INTRAVENOUS | Status: AC
  Administered 2019-09-09: 250 mL via INTRAVENOUS
  Filled 2019-09-09: qty 250

## 2019-09-09 MED ORDER — FUROSEMIDE 10 MG/ML IJ SOLN
20.0000 mg | Freq: Once | INTRAMUSCULAR | Status: AC
  Administered 2019-09-09: 20 mg via INTRAVENOUS

## 2019-09-09 MED ORDER — ACETAMINOPHEN 325 MG PO TABS
ORAL_TABLET | ORAL | Status: AC
  Filled 2019-09-09: qty 2

## 2019-09-09 MED ORDER — LUSPATERCEPT-AAMT 75 MG ~~LOC~~ SOLR
0.9000 mg/kg | Freq: Once | SUBCUTANEOUS | Status: AC
  Administered 2019-09-09: 75 mg via SUBCUTANEOUS
  Filled 2019-09-09: qty 1.5

## 2019-09-09 NOTE — Progress Notes (Signed)
Per Dr. Alen Blew, okay for patient to receive treatment today with creatine 1.81

## 2019-09-09 NOTE — Progress Notes (Signed)
Hematology and Oncology Follow Up Visit  Alletta Mattos [id number] 02/10/1956 64 y.o. 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  She was found to have IPSS-R score of 2.5 since that time.  Secondary diagnosis: Stage III colon cancer diagnosed in 2008.  She has no evidence of relapse and presumably cured at this time.    Prior Therapy: She is S/P hemicolectomy done in 11/2006. 1/13 lymph nodes are positive.  This was followed by 12 cycles of FOLFOX completed in 05/2007  Aranesp 300 mcg every 2 to 3 weeks therapy was ineffective to eliminate transfusion needs.  5-azacytidine at 75 mg/m square subcutaneously started in October 2018.  She is status post 7 cycles of therapy completed in April 2019.   Current therapy: Reblozyl 75 mg subcutaneous injection every 3 weeks started in December 2021.  She gets supportive transfusion as needed to keep her hemoglobin above 8.     Interim History:  Mrs. Fitzner is here for a follow-up.  Since last visit, she continues to do well without any recent complaints.  She denies any nausea, vomiting or abdominal pain.  She denies any chest pain or difficulty breathing.  Performance status and quality of life has improved.  She has tolerated Reblozyl without any complications.     Current Outpatient Medications on File Prior to Visit  Medication Sig Dispense Refill  . allopurinol (ZYLOPRIM) 100 MG tablet Take 100 mg by mouth daily.    Marland Kitchen amiodarone (PACERONE) 200 MG tablet Take 200 mg by mouth daily.    Marland Kitchen amLODipine (NORVASC) 10 MG tablet Take 1 tablet (10 mg total) by mouth daily. 90 tablet 3  . atorvastatin (LIPITOR) 40 MG tablet TAKE 1 TABLET BY MOUTH EVERY DAY 90 tablet 0  . blood glucose meter kit and supplies Dispense based on patient and insurance preference. Use up to four times daily as directed. (FOR ICD-10 E10.9, E11.9). 1 each 3  . colchicine 0.6 MG tablet  Take 0.6 mg by mouth 2 (two) times daily as needed (for gout).    Marland Kitchen doxazosin (CARDURA) 1 MG tablet Take 1 tablet (1 mg total) by mouth at bedtime. 30 tablet 6  . doxycycline (ADOXA) 100 MG tablet Take 1 tablet (100 mg total) by mouth 2 (two) times daily for 7 days. 14 tablet 0  . furosemide (LASIX) 40 MG tablet TAKE 1 TABLET BY MOUTH EVERY DAY 90 tablet 1  . gabapentin (NEURONTIN) 300 MG capsule     . glucose blood test strip Use as instructed 100 each 12  . hydrALAZINE (APRESOLINE) 100 MG tablet Take 1 tablet (100 mg total) by mouth 3 (three) times daily. 180 tablet 5  . insulin glargine (LANTUS) 100 unit/mL SOPN Inject 0.15 mLs (15 Units total) into the skin daily. (Patient taking differently: Inject 15 Units into the skin daily as needed (blood sugar over 150). )    . Insulin Pen Needle 29G X 5MM MISC Use as directed 200 each 0  . latanoprost (XALATAN) 0.005 % ophthalmic solution Place 1 drop into both eyes at bedtime.     Marland Kitchen levothyroxine (SYNTHROID) 88 MCG tablet Take 1 tablet (88 mcg total) by mouth daily. 30 tablet 2  . metoprolol succinate (TOPROL-XL) 25 MG 24 hr tablet Take 1 tablet (25 mg total) by mouth daily. STOP taking this unless heart rate >100bpm OR directed by your doctor 30 tablet 5  . Multiple Vitamins-Minerals (CENTRUM SILVER  50+WOMEN) TABS Take 1 tablet by mouth daily.    . sildenafil (REVATIO) 20 MG tablet Take 1 tablet (20 mg total) by mouth 3 (three) times daily. 270 tablet 3  . sitaGLIPtin (JANUVIA) 50 MG tablet Take 1 tablet (50 mg total) by mouth daily. 90 tablet 0               Allergies:  Allergies  Allergen Reactions  . Ancef [Cefazolin] Itching    Severe itching- after procedure, ancef was the antibiotic.-04/02/17  . Sulfa Antibiotics Rash and Other (See Comments)    Blisters, also     Physical Exam:  Blood pressure (!) 142/57, pulse 88, temperature 99.1 F (37.3 C), temperature source Temporal, resp. rate 18, height '5\' 4"'  (1.626 m), weight 189  lb 12.8 oz (86.1 kg), SpO2 96 %.       ECOG: 2   General appearance: Alert, awake without any distress. Head: Atraumatic without abnormalities Oropharynx: Without any thrush or ulcers. Eyes: No scleral icterus. Lymph nodes: No lymphadenopathy noted in the cervical, supraclavicular, or axillary nodes Heart:regular rate and rhythm,  systolic murmur noted. Lung: Clear to auscultation without any rhonchi, wheezes or dullness to percussion. Abdomin: Soft, nontender without any shifting dullness or ascites. Musculoskeletal: No clubbing or cyanosis. Neurological: No motor or sensory deficits. Skin: No rashes or lesions.                Lab Results: Lab Results  Component Value Date   WBC 19.0 (H) 09/09/2019   HGB 7.4 (L) 09/09/2019   HCT 21.9 (L) 09/09/2019   MCV 89.4 09/09/2019   PLT 157 09/09/2019     Chemistry      Component Value Date/Time   NA 140 08/18/2019 0902   NA 135 06/22/2019 1216   NA 134 (L) 05/26/2017 0949   K 4.1 08/18/2019 0902   K 4.0 05/26/2017 0949   CL 110 08/18/2019 0902   CL 96 (L) 03/31/2012 1513   CO2 21 (L) 08/18/2019 0902   CO2 24 05/26/2017 0949   BUN 58 (H) 08/18/2019 0902   BUN 64 (H) 06/22/2019 1216   BUN 29.2 (H) 05/26/2017 0949   CREATININE 2.41 (H) 08/18/2019 0902   CREATININE 2.24 (H) 01/27/2019 1355   CREATININE 1.4 (H) 05/26/2017 0949      Component Value Date/Time   CALCIUM 8.9 08/18/2019 0902   CALCIUM 8.9 05/26/2017 0949   ALKPHOS 124 08/18/2019 0902   ALKPHOS 157 (H) 05/26/2017 0949   AST 39 08/18/2019 0902   AST 29 05/26/2017 0949   ALT 22 08/18/2019 0902   ALT 23 05/26/2017 0949   BILITOT 0.5 08/18/2019 0902   BILITOT 2.19 (H) 05/26/2017 0949      Impression and Plan:  64 year old woman with:   1.  MDS presented with symptomatic anemia diagnosed in 2017.  Status post therapy outlined above.     She has tolerated Reblozyl without any major complications.  Her transfusion needs have continue to  improve with decrease in the number of transfusions in the last 3 months.  Risks and benefits of continuing this therapy long-term was reviewed.  Potential patient including hypertension fibrosis were reiterated.  He is agreeable to continue at this time.  2.  Anemia: She will receive 1 unit of packed red cell transfusion given her hemoglobin is below 8 at this time.  She will be premedicated with Tylenol, but her Lasix.  3. Hypertension: Continues to be under reasonable control at this time without  any issues.   4.  Aortic stenosis: No heart failure or exacerbation noted at this time.   5.  Prognosis: Therapy remains palliative although aggressive measures are warranted given her reasonable performance status.  6. Follow-up: In 3 weeks for lab and injection and MD follow-up in 9 weeks.  30  minutes were spent on this visit.  Time was dedicated to reviewing her disease status, treatment options and future plan.   Zola Button, MD 2/25/20219:38 AM

## 2019-09-09 NOTE — Patient Instructions (Signed)
Blood Transfusion, Adult, Care After This sheet gives you information about how to care for yourself after your procedure. Your doctor may also give you more specific instructions. If you have problems or questions, contact your doctor. Follow these instructions at home:   Take over-the-counter and prescription medicines only as told by your doctor.  Go back to your normal activities as told by your doctor.  Follow instructions from your doctor about how to take care of the area where an IV tube was put into your vein (insertion site). Make sure you: ? Wash your hands with soap and water before you change your bandage (dressing). If there is no soap and water, use hand sanitizer. ? Change your bandage as told by your doctor.  Check your IV insertion site every day for signs of infection. Check for: ? More redness, swelling, or pain. ? More fluid or blood. ? Warmth. ? Pus or a bad smell. Contact a doctor if:  You have more redness, swelling, or pain around the IV insertion site.  You have more fluid or blood coming from the IV insertion site.  Your IV insertion site feels warm to the touch.  You have pus or a bad smell coming from the IV insertion site.  Your pee (urine) turns pink, red, or brown.  You feel weak after doing your normal activities. Get help right away if:  You have signs of a serious allergic or body defense (immune) system reaction, including: ? Itchiness. ? Hives. ? Trouble breathing. ? Anxiety. ? Pain in your chest or lower back. ? Fever, flushing, and chills. ? Fast pulse. ? Rash. ? Watery poop (diarrhea). ? Throwing up (vomiting). ? Dark pee. ? Serious headache. ? Dizziness. ? Stiff neck. ? Yellow color in your face or the white parts of your eyes (jaundice). Summary  After a blood transfusion, return to your normal activities as told by your doctor.  Every day, check for signs of infection where the IV tube was put into your vein.  Some  signs of infection are warm skin, more redness and pain, more fluid or blood, and pus or a bad smell where the needle went in.  Contact your doctor if you feel weak or have any unusual symptoms. This information is not intended to replace advice given to you by your health care provider. Make sure you discuss any questions you have with your health care provider. Document Released: 07/22/2014 Document Revised: 11/05/2017 Document Reviewed: 02/23/2016 Elsevier Patient Education  2020 [employer]  Coronavirus (COVID-19) Are you at risk?  Are you at risk for the Coronavirus (COVID-19)?  To be considered HIGH RISK for Coronavirus (COVID-19), you have to meet the following criteria:  . Traveled to [place], [place], [place], [place] or [place]; or in the [place] to [place], [place], [place], or [place]; and have fever, cough, and shortness of breath within the last 2 weeks of travel OR . Been in close contact with a person diagnosed with COVID-19 within the last 2 weeks and have fever, cough, and shortness of breath . IF YOU DO NOT MEET THESE CRITERIA, YOU ARE CONSIDERED LOW RISK FOR COVID-19.  What to do if you are HIGH RISK for COVID-19?  . If you are having a medical emergency, call 911. . Seek medical care right away. Before you go to a doctor's office, urgent care or emergency department, call ahead and tell them about your recent travel, contact with someone diagnosed with COVID-19, and   your symptoms. You should receive instructions from your physician's office regarding next steps of care.  . When you arrive at healthcare provider, tell the healthcare staff immediately you have returned from visiting [place], [place], [place], [place] or [place]; or traveled in the [place] to [place], [place], [place], or [place]; in the last two weeks or you have been in close contact with a person diagnosed with COVID-19 in the last 2 weeks.   . Tell the health care staff about  your symptoms: fever, cough and shortness of breath. . After you have been seen by a medical provider, you will be either: o Tested for (COVID-19) and discharged home on quarantine except to seek medical care if symptoms worsen, and asked to  - Stay home and avoid contact with others until you get your results (4-5 days)  - Avoid travel on public transportation if possible (such as bus, train, or airplane) or o Sent to the Emergency Department by EMS for evaluation, COVID-19 testing, and possible admission depending on your condition and test results.  What to do if you are LOW RISK for COVID-19?  Reduce your risk of any infection by using the same precautions used for avoiding the common cold or flu:  . Wash your hands often with soap and warm water for at least 20 seconds.  If soap and water are not readily available, use an alcohol-based hand sanitizer with at least 60% alcohol.  . If coughing or sneezing, cover your mouth and nose by coughing or sneezing into the elbow areas of your shirt or coat, into a tissue or into your sleeve (not your hands). . Avoid shaking hands with others and consider head nods or verbal greetings only. . Avoid touching your eyes, nose, or mouth with unwashed hands.  . Avoid close contact with people who are sick. . Avoid places or events with large numbers of people in one location, like concerts or sporting events. . Carefully consider travel plans you have or are making. . If you are planning any travel outside or inside the [place], visit the CDC's Travelers' Health webpage for the latest health notices. . If you have some symptoms but not all symptoms, continue to monitor at home and seek medical attention if your symptoms worsen. . If you are having a medical emergency, call 911.   ADDITIONAL HEALTHCARE OPTIONS FOR PATIENTS  [facility] / e-Visit: [email or web address]         [facility]:  [phone]  [facility]: [phone]                   [facility]: [phone]   

## 2019-09-09 NOTE — Addendum Note (Signed)
Addended by: [name] on: 09/09/2019 09:49 AM   Modules accepted: Orders

## 2019-09-10 ENCOUNTER — Telehealth: Payer: Self-pay | Admitting: Oncology

## 2019-09-10 DIAGNOSIS — D124 Benign neoplasm of descending colon: Secondary | ICD-10-CM | POA: Diagnosis not present

## 2019-09-10 DIAGNOSIS — K635 Polyp of colon: Secondary | ICD-10-CM | POA: Diagnosis not present

## 2019-09-10 LAB — BPAM RBC
Blood Product Expiration Date: 202103192359
ISSUE DATE / TIME: 202102251118
Unit Type and Rh: 600

## 2019-09-10 LAB — TYPE AND SCREEN
ABO/RH(D): A NEG
Antibody Screen: NEGATIVE
Unit division: 0

## 2019-09-10 NOTE — Telephone Encounter (Signed)
Scheduled appt per 2/25 los.  Left a vm of the appt date and time.

## 2019-09-11 ENCOUNTER — Ambulatory Visit: Payer: PPO | Attending: Internal Medicine

## 2019-09-11 DIAGNOSIS — Z23 Encounter for immunization: Secondary | ICD-10-CM

## 2019-09-11 NOTE — Progress Notes (Signed)
   Covid-19 Vaccination Clinic  Name:  Patricia Mata    MRN: [id number] DOB: 1956-05-30  09/11/2019  Patricia Mata was observed post Covid-19 immunization for 15 minutes without incidence. She was provided with Vaccine Information Sheet and instruction to access the V-Safe system.   Patricia Mata was instructed to call 911 with any severe reactions post vaccine: Marland Kitchen Difficulty breathing  . Swelling of your face and throat  . A fast heartbeat  . A bad rash all over your body  . Dizziness and weakness    Immunizations Administered    Name Date Dose VIS Date Route   Pfizer COVID-19 Vaccine 09/11/2019 10:14 AM 0.3 mL 06/25/2019 Intramuscular   Manufacturer: [employer]   Lot: [id number]   [place]: [id number]

## 2019-09-14 ENCOUNTER — Other Ambulatory Visit: Payer: Self-pay

## 2019-09-14 ENCOUNTER — Inpatient Hospital Stay (HOSPITAL_COMMUNITY)
Admission: EM | Admit: 2019-09-14 | Discharge: 2019-09-17 | DRG: 193 | Disposition: A | Discharge status: Home / Self Care | Payer: PPO | Attending: Internal Medicine | Admitting: Internal Medicine

## 2019-09-14 ENCOUNTER — Ambulatory Visit (HOSPITAL_COMMUNITY): Payer: PPO

## 2019-09-14 ENCOUNTER — Emergency Department (HOSPITAL_COMMUNITY): Payer: PPO

## 2019-09-14 ENCOUNTER — Encounter (HOSPITAL_COMMUNITY): Payer: Self-pay | Admitting: Emergency Medicine

## 2019-09-14 DIAGNOSIS — D469 Myelodysplastic syndrome, unspecified: Secondary | ICD-10-CM | POA: Diagnosis not present

## 2019-09-14 DIAGNOSIS — Z801 Family history of malignant neoplasm of trachea, bronchus and lung: Secondary | ICD-10-CM

## 2019-09-14 DIAGNOSIS — I482 Chronic atrial fibrillation, unspecified: Secondary | ICD-10-CM | POA: Diagnosis not present

## 2019-09-14 DIAGNOSIS — R0902 Hypoxemia: Secondary | ICD-10-CM | POA: Diagnosis present

## 2019-09-14 DIAGNOSIS — D649 Anemia, unspecified: Secondary | ICD-10-CM | POA: Diagnosis present

## 2019-09-14 DIAGNOSIS — E1165 Type 2 diabetes mellitus with hyperglycemia: Secondary | ICD-10-CM | POA: Diagnosis not present

## 2019-09-14 DIAGNOSIS — D631 Anemia in chronic kidney disease: Secondary | ICD-10-CM | POA: Diagnosis not present

## 2019-09-14 DIAGNOSIS — N184 Chronic kidney disease, stage 4 (severe): Secondary | ICD-10-CM | POA: Diagnosis not present

## 2019-09-14 DIAGNOSIS — Z8616 Personal history of COVID-19: Secondary | ICD-10-CM

## 2019-09-14 DIAGNOSIS — R0602 Shortness of breath: Secondary | ICD-10-CM | POA: Diagnosis not present

## 2019-09-14 DIAGNOSIS — Z800 Family history of malignant neoplasm of digestive organs: Secondary | ICD-10-CM

## 2019-09-14 DIAGNOSIS — E039 Hypothyroidism, unspecified: Secondary | ICD-10-CM | POA: Diagnosis not present

## 2019-09-14 DIAGNOSIS — M109 Gout, unspecified: Secondary | ICD-10-CM | POA: Diagnosis present

## 2019-09-14 DIAGNOSIS — E785 Hyperlipidemia, unspecified: Secondary | ICD-10-CM | POA: Diagnosis present

## 2019-09-14 DIAGNOSIS — E119 Type 2 diabetes mellitus without complications: Secondary | ICD-10-CM | POA: Diagnosis not present

## 2019-09-14 DIAGNOSIS — R5381 Other malaise: Secondary | ICD-10-CM | POA: Diagnosis not present

## 2019-09-14 DIAGNOSIS — G4489 Other headache syndrome: Secondary | ICD-10-CM | POA: Diagnosis not present

## 2019-09-14 DIAGNOSIS — Z79890 Hormone replacement therapy (postmenopausal): Secondary | ICD-10-CM

## 2019-09-14 DIAGNOSIS — J9601 Acute respiratory failure with hypoxia: Secondary | ICD-10-CM | POA: Diagnosis not present

## 2019-09-14 DIAGNOSIS — I272 Pulmonary hypertension, unspecified: Secondary | ICD-10-CM | POA: Diagnosis not present

## 2019-09-14 DIAGNOSIS — I129 Hypertensive chronic kidney disease with stage 1 through stage 4 chronic kidney disease, or unspecified chronic kidney disease: Secondary | ICD-10-CM | POA: Diagnosis not present

## 2019-09-14 DIAGNOSIS — R040 Epistaxis: Secondary | ICD-10-CM | POA: Diagnosis not present

## 2019-09-14 DIAGNOSIS — I10 Essential (primary) hypertension: Secondary | ICD-10-CM | POA: Diagnosis present

## 2019-09-14 DIAGNOSIS — Z85038 Personal history of other malignant neoplasm of large intestine: Secondary | ICD-10-CM

## 2019-09-14 DIAGNOSIS — Z951 Presence of aortocoronary bypass graft: Secondary | ICD-10-CM

## 2019-09-14 DIAGNOSIS — E1122 Type 2 diabetes mellitus with diabetic chronic kidney disease: Secondary | ICD-10-CM | POA: Diagnosis present

## 2019-09-14 DIAGNOSIS — N189 Chronic kidney disease, unspecified: Secondary | ICD-10-CM | POA: Diagnosis present

## 2019-09-14 DIAGNOSIS — J189 Pneumonia, unspecified organism: Principal | ICD-10-CM | POA: Diagnosis present

## 2019-09-14 DIAGNOSIS — E7800 Pure hypercholesterolemia, unspecified: Secondary | ICD-10-CM | POA: Diagnosis not present

## 2019-09-14 DIAGNOSIS — Z79899 Other long term (current) drug therapy: Secondary | ICD-10-CM | POA: Diagnosis not present

## 2019-09-14 DIAGNOSIS — Z833 Family history of diabetes mellitus: Secondary | ICD-10-CM

## 2019-09-14 DIAGNOSIS — R069 Unspecified abnormalities of breathing: Secondary | ICD-10-CM | POA: Diagnosis not present

## 2019-09-14 DIAGNOSIS — R05 Cough: Secondary | ICD-10-CM | POA: Diagnosis not present

## 2019-09-14 DIAGNOSIS — Z794 Long term (current) use of insulin: Secondary | ICD-10-CM

## 2019-09-14 DIAGNOSIS — R4182 Altered mental status, unspecified: Secondary | ICD-10-CM | POA: Diagnosis not present

## 2019-09-14 DIAGNOSIS — K219 Gastro-esophageal reflux disease without esophagitis: Secondary | ICD-10-CM | POA: Diagnosis not present

## 2019-09-14 LAB — BASIC METABOLIC PANEL
Anion gap: 9 (ref 5–15)
BUN: 48 mg/dL — ABNORMAL HIGH (ref 8–23)
CO2: 23 mmol/L (ref 22–32)
Calcium: 8.9 mg/dL (ref 8.9–10.3)
Chloride: 104 mmol/L (ref 98–111)
Creatinine, Ser: 1.89 mg/dL — ABNORMAL HIGH (ref 0.44–1.00)
GFR calc Af Amer: 32 mL/min — ABNORMAL LOW (ref >60)
GFR calc non Af Amer: 28 mL/min — ABNORMAL LOW (ref >60)
Glucose, Bld: 204 mg/dL — ABNORMAL HIGH (ref 70–99)
Potassium: 4 mmol/L (ref 3.5–5.1)
Sodium: 136 mmol/L (ref 135–145)

## 2019-09-14 LAB — CBC WITH DIFFERENTIAL/PLATELET
Abs Immature Granulocytes: 1.22 10*3/uL — ABNORMAL HIGH (ref 0.00–0.07)
Basophils Absolute: 0.1 10*3/uL (ref 0.0–0.1)
Basophils Relative: 1 %
Eosinophils Absolute: 0 10*3/uL (ref 0.0–0.5)
Eosinophils Relative: 0 %
HCT: 26.1 % — ABNORMAL LOW (ref 36.0–46.0)
Hemoglobin: 8.8 g/dL — ABNORMAL LOW (ref 12.0–15.0)
Immature Granulocytes: 4 %
Lymphocytes Relative: 2 %
Lymphs Abs: 0.5 10*3/uL — ABNORMAL LOW (ref 0.7–4.0)
MCH: 30.6 pg (ref 26.0–34.0)
MCHC: 33.7 g/dL (ref 30.0–36.0)
MCV: 90.6 fL (ref 80.0–100.0)
Monocytes Absolute: 1.9 10*3/uL — ABNORMAL HIGH (ref 0.1–1.0)
Monocytes Relative: 7 %
Neutro Abs: 23.7 10*3/uL — ABNORMAL HIGH (ref 1.7–7.7)
Neutrophils Relative %: 86 %
Platelets: 212 10*3/uL (ref 150–400)
RBC: 2.88 MIL/uL — ABNORMAL LOW (ref 3.87–5.11)
RDW: 20.5 % — ABNORMAL HIGH (ref 11.5–15.5)
WBC: 27.5 10*3/uL — ABNORMAL HIGH (ref 4.0–10.5)
nRBC: 0.2 % (ref 0.0–0.2)

## 2019-09-14 LAB — BRAIN NATRIURETIC PEPTIDE: B Natriuretic Peptide: 366 pg/mL — ABNORMAL HIGH (ref 0.0–100.0)

## 2019-09-14 LAB — COMPREHENSIVE METABOLIC PANEL
ALT: 30 U/L (ref 0–44)
AST: 42 U/L — ABNORMAL HIGH (ref 15–41)
Albumin: 3.3 g/dL — ABNORMAL LOW (ref 3.5–5.0)
Alkaline Phosphatase: 170 U/L — ABNORMAL HIGH (ref 38–126)
Anion gap: 13 (ref 5–15)
BUN: 49 mg/dL — ABNORMAL HIGH (ref 8–23)
CO2: 20 mmol/L — ABNORMAL LOW (ref 22–32)
Calcium: 8.9 mg/dL (ref 8.9–10.3)
Chloride: 101 mmol/L (ref 98–111)
Creatinine, Ser: 1.82 mg/dL — ABNORMAL HIGH (ref 0.44–1.00)
GFR calc Af Amer: 33 mL/min — ABNORMAL LOW (ref >60)
GFR calc non Af Amer: 29 mL/min — ABNORMAL LOW (ref >60)
Glucose, Bld: 248 mg/dL — ABNORMAL HIGH (ref 70–99)
Potassium: 3.8 mmol/L (ref 3.5–5.1)
Sodium: 134 mmol/L — ABNORMAL LOW (ref 135–145)
Total Bilirubin: 1.4 mg/dL — ABNORMAL HIGH (ref 0.3–1.2)
Total Protein: 7.4 g/dL (ref 6.5–8.1)

## 2019-09-14 LAB — SARS CORONAVIRUS 2 (TAT 6-24 HRS): SARS Coronavirus 2: NEGATIVE

## 2019-09-14 LAB — POC SARS CORONAVIRUS 2 AG -  ED: SARS Coronavirus 2 Ag: NEGATIVE

## 2019-09-14 MED ORDER — LEVOFLOXACIN IN D5W 750 MG/150ML IV SOLN: 750.0000 mg | INTRAVENOUS | Status: DC

## 2019-09-14 MED ORDER — HYDRALAZINE HCL 50 MG PO TABS
100.0000 mg | ORAL_TABLET | Freq: Three times a day (TID) | ORAL | Status: DC
  Administered 2019-09-14 – 2019-09-17 (×10): 100 mg via ORAL
  Filled 2019-09-14 (×2): qty 2
  Filled 2019-09-14: qty 10
  Filled 2019-09-14 (×9): qty 2

## 2019-09-14 MED ORDER — SODIUM CHLORIDE 0.9% FLUSH
3.0000 mL | Freq: Two times a day (BID) | INTRAVENOUS | Status: DC
  Administered 2019-09-14 – 2019-09-17 (×7): 3 mL via INTRAVENOUS

## 2019-09-14 MED ORDER — SODIUM CHLORIDE 0.9 % IV SOLN: 250.0000 mL | INTRAVENOUS | Status: DC | PRN

## 2019-09-14 MED ORDER — AMLODIPINE BESYLATE 5 MG PO TABS
10.0000 mg | ORAL_TABLET | Freq: Once | ORAL | Status: AC
  Administered 2019-09-14: 10 mg via ORAL
  Filled 2019-09-14: qty 2

## 2019-09-14 MED ORDER — SILDENAFIL CITRATE 20 MG PO TABS
20.0000 mg | ORAL_TABLET | Freq: Three times a day (TID) | ORAL | Status: DC
  Administered 2019-09-14 – 2019-09-17 (×10): 20 mg via ORAL
  Filled 2019-09-14 (×14): qty 1

## 2019-09-14 MED ORDER — ENOXAPARIN SODIUM 30 MG/0.3ML ~~LOC~~ SOLN
30.0000 mg | SUBCUTANEOUS | Status: DC
  Administered 2019-09-14 – 2019-09-15 (×2): 30 mg via SUBCUTANEOUS
  Filled 2019-09-14 (×2): qty 0.3

## 2019-09-14 MED ORDER — ZOLPIDEM TARTRATE 5 MG PO TABS
5.0000 mg | ORAL_TABLET | Freq: Every evening | ORAL | Status: DC | PRN
  Administered 2019-09-14 – 2019-09-16 (×3): 5 mg via ORAL
  Filled 2019-09-14 (×3): qty 1

## 2019-09-14 MED ORDER — FUROSEMIDE 40 MG PO TABS
40.0000 mg | ORAL_TABLET | Freq: Once | ORAL | Status: AC
  Administered 2019-09-14: 40 mg via ORAL
  Filled 2019-09-14: qty 1

## 2019-09-14 MED ORDER — SODIUM CHLORIDE 0.9 % IV SOLN: 1.0000 g | INTRAVENOUS | Status: DC

## 2019-09-14 MED ORDER — SODIUM CHLORIDE 0.9 % IV SOLN: 500.0000 mg | INTRAVENOUS | Status: DC

## 2019-09-14 MED ORDER — AMIODARONE HCL 200 MG PO TABS
200.0000 mg | ORAL_TABLET | Freq: Once | ORAL | Status: AC
  Administered 2019-09-14: 200 mg via ORAL
  Filled 2019-09-14: qty 1

## 2019-09-14 MED ORDER — LEVOFLOXACIN IN D5W 750 MG/150ML IV SOLN
750.0000 mg | Freq: Once | INTRAVENOUS | Status: AC
  Administered 2019-09-14: 750 mg via INTRAVENOUS
  Filled 2019-09-14: qty 150

## 2019-09-14 MED ORDER — GUAIFENESIN-DM 100-10 MG/5ML PO SYRP
5.0000 mL | ORAL_SOLUTION | ORAL | Status: DC | PRN
  Administered 2019-09-14 – 2019-09-17 (×5): 5 mL via ORAL
  Filled 2019-09-14 (×6): qty 10

## 2019-09-14 MED ORDER — FUROSEMIDE 40 MG PO TABS
40.0000 mg | ORAL_TABLET | Freq: Every day | ORAL | Status: DC
  Administered 2019-09-15 – 2019-09-17 (×3): 40 mg via ORAL
  Filled 2019-09-14 (×3): qty 1

## 2019-09-14 MED ORDER — SODIUM CHLORIDE 0.9% FLUSH: 3.0000 mL | INTRAVENOUS | Status: DC | PRN

## 2019-09-14 MED ORDER — HYDRALAZINE HCL 10 MG PO TABS: 100.0000 mg | ORAL_TABLET | Freq: Once | ORAL | Status: DC

## 2019-09-14 NOTE — ED Triage Notes (Signed)
Arrives via EMS from home. C/C SOB, starting this morning, complains of cough x3 months, productive with green/yellow sputum, lung sounds clear per EMS, 92% on RA, EMS placed her on 2L and oxygen improved to 96%.

## 2019-09-14 NOTE — ED Notes (Signed)
ED provider [facility] notified of Negative POC Covid results

## 2019-09-14 NOTE — Progress Notes (Signed)
Patient-reported Beta-Lactam Allergy Assessment  Specific drug that caused reaction: Ancef Reaction(s) that occurred: patient experienced severe itching following a procedure where Ancef had been given pre-procedurally, but reaction does not appear to have been explicitly linked to antibiotic  Antibiotics tolerated in the past: . Patient reported:  none . Historical data obtained from the EMR:  Amoxicillin/ Clavulanate (2021)  Information received from:  Medical record   Based on the above interview, it has been deemed appropriate for patient to be administered Ceftriaxone as the risk for cross-reactivity to documented reaction is low.  Reuel Boom, PharmD, BCPS [phone] 09/14/2019, 12:27 PM

## 2019-09-14 NOTE — Plan of Care (Signed)
  Problem: Clinical Measurements: Goal: Ability to maintain clinical measurements within normal limits will improve Outcome: Progressing Goal: Will remain free from infection Outcome: Progressing Goal: Diagnostic test results will improve Outcome: Progressing Goal: Respiratory complications will improve Outcome: Progressing Goal: Cardiovascular complication will be avoided Outcome: Progressing   Problem: Activity: Goal: Risk for activity intolerance will decrease Outcome: Progressing   Problem: Nutrition: Goal: Adequate nutrition will be maintained Outcome: Progressing   Problem: Safety: Goal: Ability to remain free from injury will improve Outcome: Progressing   Problem: Skin Integrity: Goal: Risk for impaired skin integrity will decrease Outcome: Progressing   

## 2019-09-14 NOTE — Progress Notes (Signed)
PHARMACY NOTE:  ANTIMICROBIAL RENAL DOSAGE ADJUSTMENT  Current antimicrobial regimen includes a mismatch between antimicrobial dosage and estimated renal function.  As per policy approved by the Pharmacy & Therapeutics and Medical Executive Committees, the antimicrobial dosage will be adjusted accordingly.  Current antimicrobial dosage:  Levofloxacin 750mg  IV q24h x 4 days  Indication: CAP  Renal Function:  Estimated Creatinine Clearance: 31.2 mL/min (A) (by C-G formula based on SCr of 1.89 mg/dL (H)).     Antimicrobial dosage has been changed to:  Levofloxacin 750mg  IV q48h x 4 days    Thank you for allowing pharmacy to be a part of this patient's care.  Everette Rank, [facility] 09/14/2019 9:26 PM

## 2019-09-14 NOTE — ED Notes (Signed)
Pharmacy called regarding insufficient quanity of hydralazine, will tube.

## 2019-09-14 NOTE — ED Notes (Signed)
X-ray at bedside

## 2019-09-14 NOTE — ED Notes (Signed)
Report given to Mindi Slicker, RN on 4W

## 2019-09-14 NOTE — ED Provider Notes (Addendum)
[facility] DEPT Provider Note   CSN: [id number] Arrival date & time: 09/14/19  0719     History Chief Complaint  Patient presents with  . Shortness of Breath  . Cough    Patricia Mata is a 64 y.o. female.  Patient with increased shortness of breath and a cough which is occasionally productive all worse in the last few days.  Much worse today.  EMS said lung sounds were clear oxygen sats on room air were 92% with the placed her on oxygen which improved things to 96%.  Patient was tachypneic and she was tachypneic upon arrival with respiratory rate around 21.  Would go up above that at times.  Patient denies any leg pain or leg swelling.  Patient's past medical history is significant for hypertension diabetes, pulmonary hypertension, MDS.  Patient had a blood transfusion just about a week ago.  Patient had a COVID-19 infection with admission to [facility] in early September 2020        Past Medical History:  Diagnosis Date  . Acute kidney injury (nontraumatic) ([place])   . Acute renal failure (ARF) ([place]) 06/08/2018  . Cancer [facility]) 2008   Colon   . Diabetes mellitus without complication ([place])   . Gout 09/08/2018  . Hypertension   . Macrocytic anemia     Patient Active Problem List   Diagnosis Date Noted  . CAP (community acquired pneumonia) 09/14/2019  . Neutropenia ([place]) 05/25/2019  . Malignant neoplasm of colon ([place]) 05/25/2019  . Bradycardia 03/21/2019  . Pneumonia due to COVID-19 virus 03/18/2019  . Type 2 diabetes mellitus without complication, without long-term current use of insulin ([place]) 12/10/2018  . Hypothyroidism 12/10/2018  . Seasonal allergies 12/10/2018  . Gout 09/08/2018  . Acute osteomyelitis of pelvic region and thigh ([place])   . 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 Decubitus ulcer of coccygeal region, stage 4 ([place])   . Sacral wound 06/08/2018  . Iron deficiency anemia due to chronic blood loss 06/08/2018  .  Hyponatremia 06/08/2018  . Constipation 06/08/2018  . Palliative care encounter   . Pressure injury of skin 05/22/2018  . Acute hypoxemic respiratory failure ([place])   . Cardiogenic shock ([place])   . Atrial fibrillation with rapid ventricular response ([place]) 05/11/2018  . Symptomatic anemia 06/14/2017  . Severe aortic stenosis 06/10/2017  . Carotid artery disease ([place]) 06/10/2017  . Essential hypertension 05/13/2017  . Hyperlipidemia 05/13/2017  . Bilateral lower extremity edema 05/13/2017  . Port-A-Cath in place 04/28/2017  . MDS (myelodysplastic syndrome) ([place]) 03/20/2017  . Goals of care, counseling/discussion 03/20/2017  . Anemia in chronic kidney disease 05/10/2016  . Anemia in stage 1 chronic kidney disease 05/10/2016  . Anemia 02/09/2016  . 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 COLON SURGERY    . DEBRIDMENT OF DECUBITUS ULCER N/A 06/12/2018   Procedure: DEBRIDMENT OF DECUBITUS ULCER;  Surgeon: Wallace Going, DO;  Location: [facility] ORS;  Service: Plastics;  Laterality: N/A;  . IR FLUORO GUIDE PORT INSERTION RIGHT  04/02/2017  . IR REMOVAL TUN ACCESS W/ PORT W/O FL MOD SED  03/16/2019  . IR US GUIDE VASC ACCESS RIGHT  04/02/2017  . RIGHT HEART CATH N/A 04/23/2019   Procedure: RIGHT HEART CATH;  Surgeon: Jolaine Artist, MD;  Location: [facility] CV LAB;  Service: Cardiovascular;  Laterality: N/A;  . RIGHT/LEFT HEART CATH AND  CORONARY ANGIOGRAPHY N/A 05/21/2018   Procedure: RIGHT/LEFT HEART CATH AND CORONARY ANGIOGRAPHY;  Surgeon: Nelva Bush, MD;  Location: [facility] CV LAB;  Service: Cardiovascular;  Laterality: N/A;     OB History   No obstetric history on file.     Family History  Problem Relation Age of Onset  . Hypertension Mother   . Diabetes Mother   . Cervical cancer Mother   . Heart attack Father   . Hypertension Sister   . Hypertension Brother   .  Hypertension Sister   . Hypertension Sister   . Prostate cancer Brother   . HIV/AIDS Brother     Social History   Tobacco Use  . Smoking status: Former Smoker    Packs/day: 0.25    Years: 20.00    Pack years: 5.00    Types: Cigarettes    Quit date: 01/31/2001    Years since quitting: 18.6  . Smokeless tobacco: Former Network engineer Use Topics  . Alcohol use: Yes    Alcohol/week: 2.0 standard drinks    Types: 2 Shots of liquor per week  . Drug use: Never    Home Medications Prior to Admission medications   Medication Sig Start Date End Date Taking? Authorizing Provider  allopurinol (ZYLOPRIM) 100 MG tablet Take 100 mg by mouth daily. 05/07/19  Yes [provider]  amiodarone (PACERONE) 200 MG tablet TAKE 1 TABLET BY MOUTH EVERY DAY Patient taking differently: Take 200 mg by mouth daily.  08/02/19  Yes Bensimhon, Shaune Pascal, MD  amLODipine (NORVASC) 10 MG tablet Take 1 tablet (10 mg total) by mouth daily. 02/15/19  Yes Bensimhon, Shaune Pascal, MD  atorvastatin (LIPITOR) 40 MG tablet TAKE 1 TABLET BY MOUTH EVERY DAY Patient taking differently: Take 40 mg by mouth daily at 6 PM.  08/21/19  Yes [name], FNP  colchicine 0.6 MG tablet TAKE 1 TABLET BY MOUTH TWICE A DAY AS NEEDED FOR GOUT Patient taking differently: Take 0.6 mg by mouth daily.  09/01/19  Yes [name], FNP  doxazosin (CARDURA) 1 MG tablet Take 1 tablet (1 mg total) by mouth at bedtime. 05/21/19  Yes Bensimhon, Shaune Pascal, MD  furosemide (LASIX) 40 MG tablet TAKE 1 TABLET BY MOUTH EVERY DAY Patient taking differently: Take 40 mg by mouth daily.  05/12/19  Yes [name], FNP  hydrALAZINE (APRESOLINE) 100 MG tablet Take 1 tablet (100 mg total) by mouth 3 (three) times daily. 11/20/18  Yes Lorretta Harp, MD  insulin glargine (LANTUS) 100 unit/mL SOPN Inject 0.15 mLs (15 Units total) into the skin daily. Patient taking differently: Inject 5 Units into the skin 2 (two) times daily as needed (blood sugar over 150).   03/23/19  Yes Patrecia Pour, MD  JANUVIA 50 MG tablet TAKE 1 TABLET BY MOUTH EVERY DAY Patient taking differently: Take 50 mg by mouth daily.  07/12/19  Yes [name], FNP  latanoprost (XALATAN) 0.005 % ophthalmic solution Place 1 drop into both eyes at bedtime.    Yes [provider]  levothyroxine (SYNTHROID) 88 MCG tablet Take 1 tablet (88 mcg total) by mouth daily. 07/15/19 07/14/20 Yes [name], FNP  Multiple Vitamins-Minerals (CENTRUM SILVER 50+WOMEN) TABS Take 1 tablet by mouth daily.   Yes [provider]  oxymetazoline (AFRIN) 0.05 % nasal spray Place 1 spray into both nostrils 2 (two) times daily as needed for congestion.   Yes [provider]  sildenafil (REVATIO) 20 MG tablet Take 1 tablet (20 mg  total) by mouth 3 (three) times daily. 02/25/19  Yes Clegg, Amy D, NP  amoxicillin-clavulanate (AUGMENTIN) 875-125 MG tablet Take 1 tablet by mouth every 12 (twelve) hours. Patient not taking: Reported on 09/14/2019 08/05/19   Montine Circle, PA-C  blood glucose meter kit and supplies Dispense based on patient and insurance preference. Use up to four times daily as directed. (FOR ICD-10 E10.9, E11.9). 11/04/18   [name], FNP  Blood Glucose Monitoring Suppl [facility] Karsten Fells) w/Device KIT Check blood sugars twice daily E11.9 07/15/19   [name], FNP  glucose blood test strip Use as instructed 07/22/19   [name], FNP  Insulin Pen Needle 29G X 5MM MISC Use as directed 06/16/17   Bonnielee Haff, MD  Lancets [facility] ULTRASOFT) lancets Use as instructed to check blood sugars twice daily E11.9 07/15/19   [name], FNP  metoprolol succinate (TOPROL-XL) 25 MG 24 hr tablet Take 1 tablet (25 mg total) by mouth daily. STOP taking this unless heart rate >100bpm OR directed by your doctor Patient not taking: Reported on 09/14/2019 03/23/19   Patrecia Pour, MD    Allergies    Ancef [cefazolin] and Sulfa antibiotics  Review of Systems   Review of Systems   Constitutional: Negative for chills and fever.  HENT: Positive for congestion. Negative for rhinorrhea and sore throat.   Eyes: Negative for visual disturbance.  Respiratory: Positive for choking and shortness of breath. Negative for cough.   Cardiovascular: Negative for chest pain and leg swelling.  Gastrointestinal: Negative for abdominal pain, diarrhea, nausea and vomiting.  Genitourinary: Negative for dysuria.  Musculoskeletal: Negative for back pain and neck pain.  Skin: Negative for rash.  Neurological: Negative for dizziness, light-headedness and headaches.  Hematological: Does not bruise/bleed easily.  Psychiatric/Behavioral: Negative for confusion.    Physical Exam Updated Vital Signs BP (!) 157/41   Pulse 80   Temp 99.6 F (37.6 C) (Oral)   Resp 19   Ht 1.626 m (5' 4")   Wt 81.6 kg   SpO2 96%   BMI 30.90 kg/m   Physical Exam Vitals and nursing note reviewed.  Constitutional:      General: She is not in acute distress.    Appearance: Normal appearance. She is well-developed.  HENT:     Head: Normocephalic and atraumatic.  Eyes:     Extraocular Movements: Extraocular movements intact.     Conjunctiva/sclera: Conjunctivae normal.     Pupils: Pupils are equal, round, and reactive to light.  Cardiovascular:     Rate and Rhythm: Normal rate and regular rhythm.     Heart sounds: No murmur.  Pulmonary:     Effort: Respiratory distress present.     Breath sounds: Normal breath sounds. No wheezing.  Abdominal:     Palpations: Abdomen is soft.     Tenderness: There is no abdominal tenderness.  Musculoskeletal:     Cervical back: Neck supple.     Right lower leg: No edema.     Left lower leg: No edema.  Skin:    General: Skin is warm and dry.     Capillary Refill: Capillary refill takes less than 2 seconds.  Neurological:     General: No focal deficit present.     Mental Status: She is alert and oriented to person, place, and time.     Cranial Nerves: No  cranial nerve deficit.     Sensory: No sensory deficit.     Motor: No weakness.     ED  Results / Procedures / Treatments   Labs (all labs ordered are listed, but only abnormal results are displayed) Labs Reviewed  CBC WITH DIFFERENTIAL/PLATELET - Abnormal; Notable for the following components:      Result Value   WBC 27.5 (*)    RBC 2.88 (*)    Hemoglobin 8.8 (*)    HCT 26.1 (*)    RDW 20.5 (*)    Neutro Abs 23.7 (*)    Lymphs Abs 0.5 (*)    Monocytes Absolute 1.9 (*)    Abs Immature Granulocytes 1.22 (*)    All other components within normal limits  COMPREHENSIVE METABOLIC PANEL - Abnormal; Notable for the following components:   Sodium 134 (*)    CO2 20 (*)    Glucose, Bld 248 (*)    BUN 49 (*)    Creatinine, Ser 1.82 (*)    Albumin 3.3 (*)    AST 42 (*)    Alkaline Phosphatase 170 (*)    Total Bilirubin 1.4 (*)    GFR calc non Af Amer 29 (*)    GFR calc Af Amer 33 (*)    All other components within normal limits  BRAIN NATRIURETIC PEPTIDE - Abnormal; Notable for the following components:   B Natriuretic Peptide 366.0 (*)    All other components within normal limits  SARS CORONAVIRUS 2 (TAT 6-24 HRS)  POC SARS CORONAVIRUS 2 AG -  ED    EKG EKG Interpretation  Date/Time:  Tuesday September 14 2019 07:42:24 EST Ventricular Rate:  88 PR Interval:    QRS Duration: 85 QT Interval:  365 QTC Calculation: 442 R Axis:   30 Text Interpretation: Sinus rhythm Borderline repolarization abnormality Confirmed by Fredia Sorrow [phone]) on 09/14/2019 8:06:15 AM    ED ECG REPORT   Date: 09/14/2019  Rate: 88  Rhythm: normal sinus rhythm  QRS Axis: normal  Intervals: normal  ST/T Wave abnormalities: early repolarization  Conduction Disutrbances:none  Narrative Interpretation:   Old EKG Reviewed: none available  I have personally reviewed the EKG tracing and agree with the computerized printout as noted.   Radiology DG Chest Port 1 View  Result Date:  09/14/2019 CLINICAL DATA:  Shortness of breath, cough, congestion EXAM: PORTABLE CHEST 1 VIEW COMPARISON:  05/21/2019 FINDINGS: Bilateral patchy airspace disease noted concerning for pneumonia. Heart is borderline in size. Possible small effusions. No acute bony abnormality. IMPRESSION: Patchy bilateral airspace opacities concerning for pneumonia. Suspect small bilateral effusions. Electronically Signed   By: Rolm Baptise M.D.   On: 09/14/2019 08:17    Procedures Procedures (including critical care time)  Medications Ordered in ED Medications  sildenafil (REVATIO) tablet 20 mg (20 mg Oral Given 09/14/19 1131)  hydrALAZINE (APRESOLINE) tablet 100 mg (100 mg Oral Given 09/14/19 0857)  enoxaparin (LOVENOX) injection 30 mg (has no administration in time range)  sodium chloride flush (NS) 0.9 % injection 3 mL (has no administration in time range)  sodium chloride flush (NS) 0.9 % injection 3 mL (has no administration in time range)  0.9 %  sodium chloride infusion (has no administration in time range)  azithromycin (ZITHROMAX) 500 mg in sodium chloride 0.9 % 250 mL IVPB (has no administration in time range)  cefTRIAXone (ROCEPHIN) 1 g in sodium chloride 0.9 % 100 mL IVPB (has no administration in time range)  amiodarone (PACERONE) tablet 200 mg (200 mg Oral Given 09/14/19 0836)  amLODipine (NORVASC) tablet 10 mg (10 mg Oral Given 09/14/19 0836)  furosemide (LASIX) tablet 40 mg (  40 mg Oral Given 09/14/19 0836)  levofloxacin (LEVAQUIN) IVPB 750 mg (0 mg Intravenous Stopped 09/14/19 1058)    ED Course  I have reviewed the triage vital signs and the nursing notes.  Pertinent labs & imaging results that were available during my care of the patient were reviewed by me and considered in my medical decision making (see chart for details).    MDM Rules/Calculators/A&P                     Patient tachypneic.  States that she feels short of breath.  Oxygen saturations on 2L round 96%.  Sometimes higher.  On room  air at rest oxygen sats were 95% 90%.  With ambulation patient became very short of breath and tachypneic but oxygen sats stayed around 90.  But prior at rest on room air we had an oxygen sats that went down to 88-86%.  Chest x-ray is consistent with bilateral patchy infiltrates.  Patient's BNP is elevated but not as much as it has been in the past.  Patient given all her morning meds here because she had not taken them prior to arrival.  Patient has significant pulmonary hypertension.  For this pneumonia was initially thinking patient would maybe be able to be discharged home since her oxygen sats were doing okay she was started on Levaquin.  This would be a community-acquired pneumonia.  Also point-of-care Covid testing was negative.  Patient did have positive Covid in September 2020.  Discussed with hospitalist they will see and admit.  Formal Covid test has been ordered.    Final Clinical Impression(s) / ED Diagnoses Final diagnoses:  Hypoxia  Community acquired pneumonia, unspecified laterality    Rx / DC Orders ED Discharge Orders    None       Fredia Sorrow, MD 09/14/19 1751    Fredia Sorrow, MD 09/14/19 1250

## 2019-09-14 NOTE — ED Notes (Signed)
Pt ambulated 20 ft.  Pt's respirations maintained from 33 to 40 /min.  Pt's O2 was at 94 %  While walking.   Pt's continued working to breath for the duration of the ambulation.  Pt's O2 was at 90 percent upon completion of ambulation and was exhausted.

## 2019-09-14 NOTE — H&P (Deleted)
[facility] History & Physical  Rob Doctor, hospital MD   Patricia Mata 09/14/2019  Chief Complaint: Cough and SOB HPI: The patient is a 64 yo female w/ hx of IDDM, HTN, CKD, pulm HTN gout and colon cancer (2008) who presents w/ 3 mos hx of cough, and 3 day hx of SOB w/ worsening cough now prodcutive of yellow and green phlegm.  No chest pain, no abd pain , slight nausea no vomiting and no fevers or chills.  In ED CXR shows +bilat infiltrates c/w PNA.  BP's are slightly high, 92% on 2L [place], HR 80 and RR 14-21. Labs show Na 134 BUN 49, creat 1.8 (b/l 1.8- 2.4).  WBC 27k, Hb 8.8 and plts wnl. Pt was given IV Rocephin. Asked to see for admisison.   Pt lives w/ husband in [place], worked as school teaching asst for 38 yrs, now retired.  Has CKD sees [facility] doctor regularly.  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   ROS  denies CP  no joint pain   no HA  no blurry vision  no rash  no diarrhea  no dysuria  no difficulty voiding  no change in urine color    Past Medical History  Past Medical History:  Diagnosis Date  . Allergy   . Arthritis    neck  . Cataract    bilateral - MD monitoring cataracts  . CHF (congestive heart failure) ([place])   . Chronic kidney disease, stage I    DR OTTELIN  HX UTIS  . Cirrhosis ([place])   . Cramp of limb   . Diabetes mellitus   . Dysphagia, unspecified(787.20)   . Dysuria   . Epistaxis   . GERD (gastroesophageal reflux disease)   . Heart murmur    NO CARDIOLOGIST  DX FOR YEARS ASYMPTOMATIC  . Lumbago   . Neoplasm of uncertain behavior of skin   . Nonspecific elevation of levels of transaminase or lactic acid dehydrogenase (LDH)   . Osteoarthrosis, unspecified whether generalized or localized, unspecified site   . Other and unspecified hyperlipidemia    diet controlled  . Pain in joint, shoulder region   . Paresthesias 06/07/2015  . Postablative ovarian failure   . Trochanteric bursitis  of left hip 02/20/2016  . Type 2 diabetes mellitus without complication ([place])   . Unspecified essential hypertension    no meds   Past Surgical History  Past Surgical History:  Procedure Laterality Date  . BREAST BIOPSY    . CARDIAC CATHETERIZATION N/A 04/03/2016   Procedure: Left Heart Cath and Coronary Angiography;  Surgeon: Belva Crome, MD;  Location: [facility];  Service: Cardiovascular;  Laterality: N/A;  . COLONOSCOPY  2012   Dr Lajoyce Corners.   . COLONOSCOPY WITH PROPOFOL N/A 09/12/2016   Procedure: COLONOSCOPY WITH PROPOFOL;  Surgeon: Milus Banister, MD;  Location: [facility];  Service: Endoscopy;  Laterality: N/A;  . CORONARY ARTERY BYPASS GRAFT N/A 04/04/2016   Procedure: CORONARY ARTERY BYPASS GRAFTING (CABG) x 3 USING RIGHT LEG GREATER SAPHENOUS VEIN GRAFT;  Surgeon: Melrose Nakayama, MD;  Location: [facility];  Service: Open Heart Surgery;  Laterality: N/A;  . ENDOVEIN HARVEST OF GREATER SAPHENOUS VEIN Right 04/04/2016   Procedure: ENDOVEIN HARVEST OF GREATER SAPHENOUS VEIN;  Surgeon: Melrose Nakayama, MD;  Location: [facility];  Service: Open Heart Surgery;  Laterality: Right;  . ESOPHAGEAL  BANDING  06/03/2019   Procedure: ESOPHAGEAL BANDING;  Surgeon: Milus Banister, MD;  Location: [facility];  Service: Endoscopy;;  . ESOPHAGEAL BANDING  06/13/2019   Procedure: ESOPHAGEAL BANDING;  Surgeon: Juanita Craver, MD;  Location: [facility] END[facility];  Service: Endoscopy;;  . ESOPHAGOGASTRODUODENOSCOPY N/A 06/13/2019   Procedure: ESOPHAGOGASTRODUODENOSCOPY (EGD);  Surgeon: Juanita Craver, MD;  Location: [facility] ENDOSCOPY;  Service: Endoscopy;  Laterality: N/A;  . ESOPHAGOGASTRODUODENOSCOPY (EGD) WITH PROPOFOL N/A 09/12/2016   Procedure: ESOPHAGOGASTRODUODENOSCOPY (EGD) WITH PROPOFOL;  Surgeon: Milus Banister, MD;  Location: [facility];  Service: Endoscopy;  Laterality: N/A;  . ESOPHAGOGASTRODUODENOSCOPY (EGD) WITH PROPOFOL N/A 06/03/2019   Procedure: ESOPHAGOGASTRODUODENOSCOPY (EGD) WITH PROPOFOL;   Surgeon: Milus Banister, MD;  Location: [facility];  Service: Endoscopy;  Laterality: N/A;  . HEMOSTASIS CLIP PLACEMENT  06/13/2019   Procedure: HEMOSTASIS CLIP PLACEMENT;  Surgeon: Juanita Craver, MD;  Location: [facility];  Service: Endoscopy;;  . IR ANGIOGRAM SELECTIVE EACH ADDITIONAL VESSEL  06/14/2019  . IR EMBO ART  VEN HEMORR LYMPH EXTRAV  INC GUIDE ROADMAPPING  06/14/2019  . IR PARACENTESIS  06/14/2019  . IR TIPS  06/14/2019  . MAXIMUM ACCESS (MAS)POSTERIOR LUMBAR INTERBODY FUSION (PLIF) 1 LEVEL Left 08/16/2015   Procedure: FOR MAXIMUM ACCESS (MAS) POSTERIOR LUMBAR INTERBODY FUSION (PLIF) LUMBAR THREE-FOUR EXTRAFORAMINAL MICRODISCECTOMY LUMBAR FIVE-SACRAL ONE LEFT;  Surgeon: Eustace Moore, MD;  Location: [facility];  Service: Neurosurgery;  Laterality: Left;  . RADIOLOGY WITH ANESTHESIA N/A 06/14/2019   Procedure: RADIOLOGY WITH ANESTHESIA;  Surgeon: Radiologist, Medication, MD;  Location: [facility];  Service: Radiology;  Laterality: N/A;  . SCLEROTHERAPY  06/13/2019   Procedure: SCLEROTHERAPY;  Surgeon: Juanita Craver, MD;  Location: [facility] ENDOSCOPY;  Service: Endoscopy;;  . TEE WITHOUT CARDIOVERSION N/A 04/04/2016   Procedure: TRANSESOPHAGEAL ECHOCARDIOGRAM (TEE);  Surgeon: Melrose Nakayama, MD;  Location: [facility];  Service: Open Heart Surgery;  Laterality: N/A;  . TUBAL LIGATION  1982   Dr Connye Burkitt  . UPPER GASTROINTESTINAL ENDOSCOPY    . VAGINAL HYSTERECTOMY  1997   Dr Rande Lawman   Family History  Family History  Problem Relation Age of Onset  . Lung cancer Father   . Arthritis Sister   . Arthritis Brother   . Heart disease Maternal Grandmother   . Heart disease Maternal Grandfather   . Heart disease Paternal Grandmother   . Heart disease Paternal Grandfather   . Breast cancer Mother   . Liver cancer Brother   . Breast cancer Maternal Aunt   . Breast cancer Paternal Aunt   . Colon cancer Neg Hx   . Esophageal cancer Neg Hx   . Rectal cancer Neg Hx   . Stomach cancer Neg Hx     Social History  reports that she has never smoked. She has never used smokeless tobacco. She reports that she does not drink alcohol or use drugs. Allergies  Allergies  Allergen Reactions  . Kiwi Extract Anaphylaxis  . Tdap [Tetanus-Diphth-Acell Pertussis] Swelling and Other (See Comments)    Swelling at injection site, gets very hot  . Statins     RHABDOMYOLYSIS  . Latex Itching, Dermatitis and Rash  . Tramadol Nausea And Vomiting   Home medications Prior to Admission medications   Medication Sig Start Date End Date Taking? Authorizing Provider  acetaminophen (TYLENOL) 500 MG tablet Take 500 mg by mouth at bedtime.     [provider]  Aromatic Inhalants (VICKS VAPOR IN) Vicks Vapor Rub apply small amount to outside of nose to  help breathing    [provider]  BD PEN NEEDLE NANO U/F 32G X 4 MM MISC USE THREE TIMES DAILY AS DIRECTED 02/01/19   Reed, Tiffany L, DO  Biotin 10000 MCG TABS Take 10,000 mcg by mouth every morning.    [provider]  bisacodyl (DULCOLAX) 10 MG suppository Place 1 suppository (10 mg total) rectally daily as needed for moderate constipation. 06/18/19   Aline August, MD  calcium carbonate (OS-CAL) 600 MG TABS Take 600 mg by mouth 2 (two) times daily with a meal.      [provider]  Cholecalciferol (VITAMIN D) 50 MCG (2000 UT) CAPS Take 2,000 Units by mouth daily.     [provider]  Cyanocobalamin (VITAMIN B 12 PO) Take 1,000 mcg by mouth daily.      [provider]  ezetimibe (ZETIA) 10 MG tablet TAKE 1 TABLET(10 MG) BY MOUTH DAILY 03/03/19   Reed, Tiffany L, DO  furosemide (LASIX) 40 MG tablet Take 40 mg by mouth daily.     [provider]  glucose blood test strip One Touch Ultra II strips. Use to test blood sugar three times daily. Dx: E11.65 08/07/17   Reed, Tiffany L, DO  insulin detemir (LEVEMIR) 100 UNIT/ML injection Inject 0.2 mLs (20 Units total) into the skin at bedtime. 06/19/19   Aline August, MD  Insulin Syringe-Needle U-100 (INSULIN SYRINGE 1CC/31GX5/16") 31G X 5/16" 1 ML MISC USE AS DIRECTED DAILY WITH LEVEMIR 10/02/18   Reed, Tiffany L, DO  JARDIANCE 25 MG TABS tablet Take 25 mg by mouth daily. 07/06/19   [provider]  lactulose (CHRONULAC) 10 GM/15ML solution Take 20 g by mouth 3 (three) times daily. 07/10/19   [provider]  loratadine (CLARITIN) 10 MG tablet Take 10 mg by mouth daily as needed for allergies.    [provider]  MAGNESIUM PO Take 500 mg by mouth 2 (two) times daily in the am and at bedtime..    [provider]  Multiple Vitamins-Minerals (MULTIVITAMIN WITH MINERALS) tablet Take 1 tablet by mouth daily.      [provider]  NOVOLOG FLEXPEN 100 UNIT/ML FlexPen Inject 12 units under the skin every morning, 8 units at lunch and 12 units at supper 04/27/19   Reed, Tiffany L, DO  ondansetron (ZOFRAN) 4 MG tablet Take 1 tablet (4 mg total) by mouth every 6 (six) hours as needed for nausea. 06/18/19   Aline August, MD  pantoprazole (PROTONIX) 40 MG tablet Take 1 tablet (40 mg total) by mouth 2 (two) times daily. 06/18/19   Aline August, MD  Polyethyl Glycol-Propyl Glycol (SYSTANE OP) Place 1 drop into both eyes 2 (two) times daily.    [provider]  Probiotic Product (PROBIOTIC DAILY PO) Take 1 capsule by mouth daily. Digestive Advantage Probiotic    [provider]  spironolactone (ALDACTONE) 50 MG tablet Take 1 tablet (50 mg total) by mouth 2 (two) times daily. 06/21/19   Gayland Curry, DO   Liver Function Tests Recent Labs  Lab 07/19/19 1436 07/21/19 1042  AST 58* 62*  ALT 41* 45*  ALKPHOS  --  127*  BILITOT 3.5* 3.4*  PROT 7.8 7.2  ALBUMIN  --  3.0*   Recent Labs  Lab 07/21/19 1042  LIPASE 29   CBC Recent Labs  Lab 07/19/19 1436 07/21/19 1042 07/21/19 1056  WBC 10.4 7.7  --   NEUTROABS 8,299* 5.9  --   HGB 16.1* 14.9  15.0  HCT 47.2* 43.4 44.0  MCV 92.5 94.6  --    PLT 151 116*  --    Basic Metabolic Panel Recent Labs  Lab 07/19/19 1436 07/21/19 1042 07/21/19 1056  NA 133* 131* 133*  K 4.5 4.6 4.6  CL 96* 97*  --   CO2 24 23  --   GLUCOSE 269* 138*  --   BUN 19 20  --   CREATININE 1.05* 1.13*  --   CALCIUM 11.1* 10.0  --    Iron/TIBC/Ferritin/ %Sat    Component Value Date/Time   IRON 29 07/13/2016 1422   TIBC 427 07/13/2016 1422   FERRITIN 13 07/13/2016 1422   IRONPCTSAT 7 (L) 07/13/2016 1422    Vitals:   07/21/19 1018 07/21/19 1030 07/21/19 1045 07/21/19 1215  BP: (!) 123/51 (!) 101/46  (!) 125/54  Pulse: 89 83    Resp: 16 15  20   Temp: 97.8 F (36.6 C)  97.9 F (36.6 C)   TempSrc: Oral  Rectal   SpO2: 99% 98%      Exam Gen alert, masked, no distress, calm No rash, cyanosis or gangrene Sclera anicteric, throat clear  No jvd or bruits Chest soft bilat basilar crackles L > R, no bronch BS RRR no MRG Abd soft ntnd no mass or ascites +bs GU defer MS no joint effusions or deformity Ext no pretib or hip edema, no wounds or ulcers Neuro is alert, Ox 3 , nf   - norvasc 10/ hydral 100 tid/ lasix 40 qd/ cardura 1mg  hs  - sildenafil 20 tid  - insulin glargine 5u bid prn/ januvia  - allopurinol 100/ colchicine 0.6 qd  - lipitor 40   - amiodarone 200 qd  - prn's/ vitamins/ supplements    Assessment/ Plan: 1. Pneumonia - comm acquired w/ bilat infiltrates, ^wBC, purulent sputum. Plan is to admit, O2 prn, IV abx for CAP w/ rocephin / azithro.  2. CKD IV - b/l creat 1.8 - 2.4.  Creat 1.8 today, no vol excess, looks euvolemic. BP's up a bit. Follow. 3. HTN - cont home BP meds, BP's a little high, cont home meds including lasix 4. IDDM - just give SSI here 5. pHTN - cont sildenafil 6. Atrial fib - cont amio 7. Gout - cont meds 8. H/o colon cancer - in 2008      Kelly Splinter, MD   [facility] 07/21/2019, 1:10 PM

## 2019-09-15 DIAGNOSIS — E7800 Pure hypercholesterolemia, unspecified: Secondary | ICD-10-CM

## 2019-09-15 DIAGNOSIS — J189 Pneumonia, unspecified organism: Principal | ICD-10-CM

## 2019-09-15 DIAGNOSIS — E119 Type 2 diabetes mellitus without complications: Secondary | ICD-10-CM

## 2019-09-15 DIAGNOSIS — R0902 Hypoxemia: Secondary | ICD-10-CM

## 2019-09-15 DIAGNOSIS — I10 Essential (primary) hypertension: Secondary | ICD-10-CM

## 2019-09-15 LAB — CBC
HCT: 25.8 % — ABNORMAL LOW (ref 36.0–46.0)
Hemoglobin: 8.4 g/dL — ABNORMAL LOW (ref 12.0–15.0)
MCH: 30 pg (ref 26.0–34.0)
MCHC: 32.6 g/dL (ref 30.0–36.0)
MCV: 92.1 fL (ref 80.0–100.0)
Platelets: 208 10*3/uL (ref 150–400)
RBC: 2.8 MIL/uL — ABNORMAL LOW (ref 3.87–5.11)
RDW: 20.4 % — ABNORMAL HIGH (ref 11.5–15.5)
WBC: 17.4 10*3/uL — ABNORMAL HIGH (ref 4.0–10.5)
nRBC: 0.5 % — ABNORMAL HIGH (ref 0.0–0.2)

## 2019-09-15 LAB — HIV ANTIBODY (ROUTINE TESTING W REFLEX): HIV Screen 4th Generation wRfx: NONREACTIVE

## 2019-09-15 MED ORDER — ATORVASTATIN CALCIUM 40 MG PO TABS
40.0000 mg | ORAL_TABLET | Freq: Every day | ORAL | Status: DC
  Administered 2019-09-15 – 2019-09-16 (×2): 40 mg via ORAL
  Filled 2019-09-15 (×2): qty 1

## 2019-09-15 MED ORDER — ADULT MULTIVITAMIN W/MINERALS CH
1.0000 | ORAL_TABLET | Freq: Every day | ORAL | Status: DC
  Administered 2019-09-15 – 2019-09-16 (×2): 1 via ORAL
  Filled 2019-09-15 (×3): qty 1

## 2019-09-15 MED ORDER — SALINE SPRAY 0.65 % NA SOLN
2.0000 | NASAL | Status: DC
  Administered 2019-09-16 – 2019-09-17 (×8): 2 via NASAL
  Filled 2019-09-15: qty 44

## 2019-09-15 MED ORDER — AMIODARONE HCL 200 MG PO TABS
200.0000 mg | ORAL_TABLET | Freq: Every day | ORAL | Status: DC
  Administered 2019-09-15 – 2019-09-17 (×3): 200 mg via ORAL
  Filled 2019-09-15 (×3): qty 1

## 2019-09-15 MED ORDER — BUDESONIDE 0.25 MG/2ML IN SUSP
0.2500 mg | Freq: Two times a day (BID) | RESPIRATORY_TRACT | Status: DC
  Filled 2019-09-15: qty 2

## 2019-09-15 MED ORDER — LEVOTHYROXINE SODIUM 88 MCG PO TABS
88.0000 ug | ORAL_TABLET | Freq: Every day | ORAL | Status: DC
  Administered 2019-09-15 – 2019-09-17 (×3): 88 ug via ORAL
  Filled 2019-09-15 (×3): qty 1

## 2019-09-15 MED ORDER — SODIUM CHLORIDE 0.9 % IV SOLN
1.0000 g | INTRAVENOUS | Status: DC
  Administered 2019-09-15 – 2019-09-16 (×2): 1 g via INTRAVENOUS
  Filled 2019-09-15: qty 10
  Filled 2019-09-15 (×2): qty 1

## 2019-09-15 MED ORDER — LIP MEDEX EX OINT
TOPICAL_OINTMENT | CUTANEOUS | Status: AC
  Filled 2019-09-15: qty 7

## 2019-09-15 MED ORDER — DOXAZOSIN MESYLATE 1 MG PO TABS
1.0000 mg | ORAL_TABLET | Freq: Every day | ORAL | Status: DC
  Administered 2019-09-15 – 2019-09-16 (×2): 1 mg via ORAL
  Filled 2019-09-15 (×2): qty 1

## 2019-09-15 MED ORDER — AMLODIPINE BESYLATE 10 MG PO TABS
10.0000 mg | ORAL_TABLET | Freq: Every day | ORAL | Status: DC
  Administered 2019-09-15 – 2019-09-17 (×3): 10 mg via ORAL
  Filled 2019-09-15 (×3): qty 1

## 2019-09-15 MED ORDER — LATANOPROST 0.005 % OP SOLN
1.0000 [drp] | Freq: Every day | OPHTHALMIC | Status: DC
  Administered 2019-09-15 – 2019-09-16 (×2): 1 [drp] via OPHTHALMIC
  Filled 2019-09-15 (×3): qty 2.5

## 2019-09-15 MED ORDER — ALLOPURINOL 100 MG PO TABS
100.0000 mg | ORAL_TABLET | Freq: Every day | ORAL | Status: DC
  Administered 2019-09-15 – 2019-09-17 (×3): 100 mg via ORAL
  Filled 2019-09-15 (×3): qty 1

## 2019-09-15 MED ORDER — PHENYLEPHRINE HCL 0.25 % NA SOLN
3.0000 | Freq: Once | NASAL | Status: AC
  Administered 2019-09-15: 3 via NASAL
  Filled 2019-09-15: qty 15

## 2019-09-15 MED ORDER — HYDRALAZINE HCL 100 MG PO TABS: 100.0000 mg | ORAL_TABLET | Freq: Three times a day (TID) | ORAL | Status: DC

## 2019-09-15 MED ORDER — COLCHICINE 0.6 MG PO TABS
0.6000 mg | ORAL_TABLET | Freq: Every day | ORAL | Status: DC
  Administered 2019-09-15 – 2019-09-17 (×3): 0.6 mg via ORAL
  Filled 2019-09-15 (×3): qty 1

## 2019-09-15 MED ORDER — SODIUM CHLORIDE 0.9 % IV SOLN
100.0000 mg | Freq: Two times a day (BID) | INTRAVENOUS | Status: DC
  Administered 2019-09-15 – 2019-09-17 (×5): 100 mg via INTRAVENOUS
  Filled 2019-09-15 (×6): qty 100

## 2019-09-15 MED ORDER — SILDENAFIL CITRATE 20 MG PO TABS: 20.0000 mg | ORAL_TABLET | Freq: Three times a day (TID) | ORAL | Status: DC

## 2019-09-15 NOTE — Progress Notes (Addendum)
Pt is having nose bleed, continuously pouring out from both nostrils; bridge of nose was pinch and ice applied immediately; provider on call paged with new order given; will continue to monitor pt.

## 2019-09-15 NOTE — Progress Notes (Addendum)
PROGRESS NOTE  Patricia Mata ZGY:[id number] DOB: 1955/11/26 DOA: 09/14/2019 PCP: [name], FNP   LOS: 1 day   Brief narrative: As per HPI,  The patient is a 64 yo female with past medical history of diabetes mellitus, hypertension, gout and colon cancer who presented to the hospital with cough shortness of breath with yellow-greenish phlegm production.  In the ED, patient was noted to have a bilateral pulmonary infiltrates consistent with pneumonia. Labs showed Na 134 BUN 49, creat 1.8 (baseline 1.8- 2.4).  WBC 27k, Hb 8.8 and platelets were within normal levels.  Pt was given IV Rocephin.  Patient follows up with [facility] as outpatient.  Assessment/Plan:  Principal Problem:   CAP (community acquired pneumonia) Active Problems:   Anemia in chronic kidney disease   Essential hypertension   Hyperlipidemia   Type 2 diabetes mellitus with hemoglobin A1c goal of less than 7.0% (HCC)   Hypoxia  Multi-focal bilateral pneumonia with acute hypoxic respiratory failure.  Patient is short of breath and dyspneic.  Feels little better today though.  On IV Levaquin.  Will change to doxycycline and Rocephin since patient will be on amiodarone  Currently on 2 L of nasal cannula oxygen.  Afebrile.  WBC at 17.4 from 27.5. HIV non reactive.  COVID-19 was negative this time.  Patient was +6 months back.  BNP mildly elevated at 366.  No blood cultures have been drawn.  Add inhaled steroids.  CKD stage IV.  Baseline creatinine of 1.8-2.4.  Volume status is okay.  Will closely monitor creatinine levels.  Creatinine  1.8 today.  Essential hypertension.  Continue on doxazosin, Lasix, hydralazine.  Diabetes mellitus type 2.  Continue sliding scale insulin Accu-Cheks diabetic diet.  Continue Lipitor.  Resume Lantus.  Patient is on Januvia at home.  History of pulmonary hypertension.  On sildenafil.  Will continue.  History of atrial fibrillation.  Continue amiodarone.  Rate controlled at  this time.  History of gout.  No acute issues.  On allopurinol and colchicine.  History of hypothyroidism.  Continue Synthroid.  Debility weakness.  Will get PT evaluation.  Ambulate in the hall.  VTE Prophylaxis: Lovenox subcu  Code Status: Full code  Family Communication: None today  Disposition Plan:  . Patient is from home . Likely disposition to home likely in 1 to 2 days . Barriers to discharge: Multifocal pneumonia on supplemental oxygen, persistent dyspnea and shortness of breath, will ambulate the patient, continue to wean oxygen if able   Consultants:  None  Procedures:  None  Antibiotics:  . Levaquin will dc  Anti-infectives (From admission, onward)   Start     Dose/Rate Route Frequency Ordered Stop   09/16/19 0900  levofloxacin (LEVAQUIN) IVPB 750 mg     750 mg 100 mL/hr over 90 Minutes Intravenous Every 48 hours 09/14/19 2126 09/20/19 0859   09/15/19 1000  levofloxacin (LEVAQUIN) IVPB 750 mg  Status:  Discontinued     750 mg 100 mL/hr over 90 Minutes Intravenous Every 24 hours 09/14/19 1214 09/14/19 1236   09/15/19 1000  levofloxacin (LEVAQUIN) IVPB 750 mg  Status:  Discontinued     750 mg 100 mL/hr over 90 Minutes Intravenous Every 24 hours 09/14/19 1510 09/14/19 2125   09/15/19 0900  azithromycin (ZITHROMAX) 500 mg in sodium chloride 0.9 % 250 mL IVPB  Status:  Discontinued     500 mg 250 mL/hr over 60 Minutes Intravenous Every 24 hours 09/14/19 1235 09/14/19 1510   09/15/19 0900  cefTRIAXone (ROCEPHIN) 1 g in sodium chloride 0.9 % 100 mL IVPB  Status:  Discontinued     1 g 200 mL/hr over 30 Minutes Intravenous Every 24 hours 09/14/19 1235 09/14/19 1510   09/14/19 0900  levofloxacin (LEVAQUIN) IVPB 750 mg     750 mg 100 mL/hr over 90 Minutes Intravenous  Once 09/14/19 0846 09/14/19 1058     Subjective: Today, patient was seen and examined at bedside.  Complains of shortness of breath cough with productive sputum and dyspnea.  Feels little better  than yesterday.  Denies chest pain.  States that her cough was present so she was able to rest better overnight.  Objective: Vitals:   09/15/19 0542 09/15/19 0905  BP: (!) 156/53 (!) 158/44  Pulse: 86 77  Resp: 20 20  Temp: 98.6 F (37 C) 98.2 F (36.8 C)  SpO2: 96% 96%    Intake/Output Summary (Last 24 hours) at 09/15/2019 1124 Last data filed at 09/15/2019 0917 Gross per 24 hour  Intake 483 ml  Output 1250 ml  Net -767 ml   Filed Weights   09/14/19 0735 09/14/19 1500  Weight: 81.6 kg 82.5 kg   Body mass index is 31.22 kg/m.   Physical Exam: GENERAL: Patient is alert awake and oriented. Not in obvious distress.  On nasal cannula oxygen. HENT: No scleral pallor or icterus. Pupils equally reactive to light. Oral mucosa is moist NECK: is supple, no gross swelling noted. CHEST: Coarse breath sounds noted bilaterally.  Diminished breath sounds bilaterally. CVS: S1 and S2 heard, no murmur. Regular rate and rhythm.  ABDOMEN: Soft, non-tender, bowel sounds are present. EXTREMITIES: No edema. CNS: Cranial nerves are intact. No focal motor deficits. SKIN: warm and dry without rashes.  Data Review: I have personally reviewed the following laboratory data and studies,  CBC: Recent Labs  Lab 09/09/19 0900 09/14/19 0736 09/15/19 0334  WBC 19.0* 27.5* 17.4*  NEUTROABS 16.0* 23.7*  --   HGB 7.4* 8.8* 8.4*  HCT 21.9* 26.1* 25.8*  MCV 89.4 90.6 92.1  PLT 157 212 353   Basic Metabolic Panel: Recent Labs  Lab 09/09/19 0900 09/14/19 0736 09/14/19 1551  NA 138 134* 136  K 3.5 3.8 4.0  CL 107 101 104  CO2 19* 20* 23  GLUCOSE 212* 248* 204*  BUN 37* 49* 48*  CREATININE 1.81* 1.82* 1.89*  CALCIUM 8.5* 8.9 8.9   Liver Function Tests: Recent Labs  Lab 09/09/19 0900 09/14/19 0736  AST 48* 42*  ALT 31 30  ALKPHOS 188* 170*  BILITOT 0.8 1.4*  PROT 6.7 7.4  ALBUMIN 3.1* 3.3*   No results for input(s): LIPASE, AMYLASE in the last 168 hours. No results for input(s):  AMMONIA in the last 168 hours. Cardiac Enzymes: No results for input(s): CKTOTAL, CKMB, CKMBINDEX, TROPONINI in the last 168 hours. BNP (last 3 results) Recent Labs    09/14/19 0736  BNP 366.0*    ProBNP (last 3 results) No results for input(s): PROBNP in the last 8760 hours.  CBG: No results for input(s): GLUCAP in the last 168 hours. Recent Results (from the past 240 hour(s))  SARS CORONAVIRUS 2 (TAT 6-24 HRS) Nasopharyngeal Nasopharyngeal Swab     Status: None   Collection Time: 09/14/19 12:51 PM   Specimen: Nasopharyngeal Swab  Result Value Ref Range Status   SARS Coronavirus 2 NEGATIVE NEGATIVE Final    Comment: (NOTE) SARS-CoV-2 target nucleic acids are NOT DETECTED. The SARS-CoV-2 RNA is generally detectable in upper and lower  respiratory specimens during the acute phase of infection. Negative results do not preclude SARS-CoV-2 infection, do not rule out co-infections with other pathogens, and should not be used as the sole basis for treatment or other patient management decisions. Negative results must be combined with clinical observations, patient history, and epidemiological information. The expected result is Negative. Fact Sheet for Patients: SugarRoll.be Fact Sheet for Healthcare Providers: https://www.woods-mathews.com/ This test is not yet approved or cleared by the [place] FDA and  has been authorized for detection and/or diagnosis of SARS-CoV-2 by FDA under an Emergency Use Authorization (EUA). This EUA will remain  in effect (meaning this test can be used) for the duration of the COVID-19 declaration under Section 56 4(b)(1) of the Act, 21 U.S.C. section 360bbb-3(b)(1), unless the authorization is terminated or revoked sooner. Performed at [facility], [place] [address], [place], [place] [address]      Studies: DG Chest Port 1 View  Result Date: 09/14/2019 CLINICAL DATA:  Shortness of breath, cough,  congestion EXAM: PORTABLE CHEST 1 VIEW COMPARISON:  05/21/2019 FINDINGS: Bilateral patchy airspace disease noted concerning for pneumonia. Heart is borderline in size. Possible small effusions. No acute bony abnormality. IMPRESSION: Patchy bilateral airspace opacities concerning for pneumonia. Suspect small bilateral effusions. Electronically Signed   By: Rolm Baptise M.D.   On: 09/14/2019 08:17      Flora Lipps, MD  [facility] 09/15/2019

## 2019-09-16 ENCOUNTER — Ambulatory Visit (HOSPITAL_COMMUNITY): Payer: PPO

## 2019-09-16 ENCOUNTER — Other Ambulatory Visit: Payer: Self-pay

## 2019-09-16 LAB — MAGNESIUM: Magnesium: 1.7 mg/dL (ref 1.7–2.4)

## 2019-09-16 LAB — CBC
HCT: 24.6 % — ABNORMAL LOW (ref 36.0–46.0)
Hemoglobin: 8.3 g/dL — ABNORMAL LOW (ref 12.0–15.0)
MCH: 30.2 pg (ref 26.0–34.0)
MCHC: 33.7 g/dL (ref 30.0–36.0)
MCV: 89.5 fL (ref 80.0–100.0)
Platelets: 206 10*3/uL (ref 150–400)
RBC: 2.75 MIL/uL — ABNORMAL LOW (ref 3.87–5.11)
RDW: 20.4 % — ABNORMAL HIGH (ref 11.5–15.5)
WBC: 14.7 10*3/uL — ABNORMAL HIGH (ref 4.0–10.5)
nRBC: 0.5 % — ABNORMAL HIGH (ref 0.0–0.2)

## 2019-09-16 LAB — BASIC METABOLIC PANEL
Anion gap: 13 (ref 5–15)
BUN: 47 mg/dL — ABNORMAL HIGH (ref 8–23)
CO2: 20 mmol/L — ABNORMAL LOW (ref 22–32)
Calcium: 8.9 mg/dL (ref 8.9–10.3)
Chloride: 102 mmol/L (ref 98–111)
Creatinine, Ser: 1.58 mg/dL — ABNORMAL HIGH (ref 0.44–1.00)
GFR calc Af Amer: 40 mL/min — ABNORMAL LOW (ref >60)
GFR calc non Af Amer: 34 mL/min — ABNORMAL LOW (ref >60)
Glucose, Bld: 196 mg/dL — ABNORMAL HIGH (ref 70–99)
Potassium: 3.7 mmol/L (ref 3.5–5.1)
Sodium: 135 mmol/L (ref 135–145)

## 2019-09-16 MED ORDER — LEVALBUTEROL HCL 0.63 MG/3ML IN NEBU: 0.6300 mg | INHALATION_SOLUTION | Freq: Four times a day (QID) | RESPIRATORY_TRACT | Status: DC | PRN

## 2019-09-16 MED ORDER — ENOXAPARIN SODIUM 40 MG/0.4ML ~~LOC~~ SOLN
40.0000 mg | SUBCUTANEOUS | Status: DC
  Administered 2019-09-16: 40 mg via SUBCUTANEOUS
  Filled 2019-09-16: qty 0.4

## 2019-09-16 MED ORDER — PHENYLEPHRINE HCL 0.25 % NA SOLN
2.0000 | Freq: Once | NASAL | Status: AC
  Administered 2019-09-16: 2 via NASAL

## 2019-09-16 NOTE — Progress Notes (Signed)
Patient was c/o stuffy nose since after nose bleeding stop; this nurse was called to come in pt's room and pt showed 2 big clots like a dollar coin size; pt stated that she felt something slipped out from her throat and spit couple big clot from her mouth; pt breath much better as verbalized; no signs of bleeding at this time; pt educated to report any signs of bleeding; will continue to monitor pt.

## 2019-09-16 NOTE — TOC Progression Note (Signed)
Transition of Care [facility]) - Progression Note    Patient Details  Name: Patricia Mata MRN: [id number] Date of Birth: 11-23-1955  [facility] CM/SW Contact  Purcell Mouton, RN Phone Number: 09/16/2019, 3:31 PM  Clinical Narrative:     Pt plan to discharge home, continue OP Rehab at [facility]. Pt states that she has no needs at present time.   Expected Discharge Plan: Home/Self Care Barriers to Discharge: No Barriers Identified  Expected Discharge Plan and Services Expected Discharge Plan: Home/Self Care   Discharge Planning Services: CM Consult                                           Social Determinants of Health (SDOH) Interventions    Readmission Risk Interventions No flowsheet data found.

## 2019-09-16 NOTE — Progress Notes (Addendum)
PROGRESS NOTE  Patricia Mata WUJ:[id number] DOB: [birth year] DOA: 09/14/2019 PCP: [name], FNP   LOS: 2 days   Brief narrative: As per HPI,  The patient is a 64 yo female with past medical history of diabetes mellitus, hypertension, gout and colon cancer who presented to the hospital with cough shortness of breath with yellow-greenish phlegm production.  In the ED, patient was noted to have a bilateral pulmonary infiltrates consistent with pneumonia. Labs showed Na 134 BUN 49, creat 1.8 (baseline 1.8- 2.4).  WBC 27k, Hb 8.8 and platelets were within normal levels.  Pt was given IV Rocephin.  Patient follows up with [facility] as outpatient.  Assessment/Plan:  Principal Problem:   CAP (community acquired pneumonia) Active Problems:   Anemia in chronic kidney disease   Essential hypertension   Hyperlipidemia   Type 2 diabetes mellitus with hemoglobin A1c goal of less than 7.0% (HCC)   Hypoxia  Multi-focal bilateral community-acquired pneumonia with acute hypoxic respiratory failure.  Patient does have dyspnea on exertion but feels little better overall.  Continue Rocephin and doxycycline.  Currently on 1 L of nasal cannula oxygen.  Afebrile.  Leukocytosis trending down.  HIV non reactive.  COVID-19 was negative this time.  Patient was +6 months back.  BNP mildly elevated at 366.  No blood cultures have been drawn.  Continue inhaled steroids.  We will continue to wean oxygen as able.  Epistaxis overnight.  History of myelodysplastic syndrome.  Not on anticoagulation.  Spontaneously resolved at this time.  Patient was extensively counseled regarding not blowing the nose.  Could consider Afrin if needed.  Check hemoglobin in a.m.  CKD stage IV.  Baseline creatinine of 1.8-2.4.  Euvolemic.  Creatinine of 1.5 today.  Patient follows up with [facility] as outpatient.  Essential hypertension.  Continue on doxazosin, Lasix, hydralazine.  Closely monitor blood  pressure.  Latest blood pressure of 157/58  Diabetes mellitus type 2.  Continue sliding scale insulin Accu-Cheks diabetic diet.  Latest POC glucose of 196.  Continue Lipitor, Lantus.  Patient is on Januvia at home.  History of pulmonary hypertension.  On sildenafil.  Will continue.  History of chronic atrial fibrillation.  Continue amiodarone.  Rate controlled at this time.  Patient is not on anticoagulation likely secondary to myelodysplastic syndrome/anemia.  History of gout.  No acute issues.  On allopurinol and colchicine at home.  Will continue.Marland Kitchen  History of hypothyroidism.  Continue Synthroid.  Debility weakness.  Physical therapy has been consulted pending evaluation.  History of myelodysplastic syndrome.  Followed by Dr. Alen Blew, oncology as outpatient.  Hemoglobin of 8.3 which has remained stable.  VTE Prophylaxis: Lovenox subcu  Code Status: Full code  Family Communication: I spoke with the patient's husband Mr. Patricia Mata on the phone and updated him about the clinical condition of the patient.  He was very thankful about the care that his wife has received in the hospital.  Disposition Plan:  . Patient is from home . Likely disposition to home likely in 1 to 2 days, PT evaluation pending . Barriers to discharge: Multifocal pneumonia on supplemental oxygen,  dyspnea and shortness of breath, PT evaluation pending, continue to wean oxygen if able  Consultants:  None  Procedures:  None  Antibiotics:  . Rocephin and doxycycline  Anti-infectives (From admission, onward)   Start     Dose/Rate Route Frequency Ordered Stop   09/16/19 0900  levofloxacin (LEVAQUIN) IVPB 750 mg  Status:  Discontinued  750 mg 100 mL/hr over 90 Minutes Intravenous Every 48 hours 09/14/19 2126 09/15/19 1217   09/15/19 1400  doxycycline (VIBRAMYCIN) 100 mg in sodium chloride 0.9 % 250 mL IVPB     100 mg 125 mL/hr over 120 Minutes Intravenous Every 12 hours 09/15/19 1217 09/19/19 0959     09/15/19 1300  cefTRIAXone (ROCEPHIN) 1 g in sodium chloride 0.9 % 100 mL IVPB     1 g 200 mL/hr over 30 Minutes Intravenous Every 24 hours 09/15/19 1217 09/19/19 1259   09/15/19 1000  levofloxacin (LEVAQUIN) IVPB 750 mg  Status:  Discontinued     750 mg 100 mL/hr over 90 Minutes Intravenous Every 24 hours 09/14/19 1214 09/14/19 1236   09/15/19 1000  levofloxacin (LEVAQUIN) IVPB 750 mg  Status:  Discontinued     750 mg 100 mL/hr over 90 Minutes Intravenous Every 24 hours 09/14/19 1510 09/14/19 2125   09/15/19 0900  azithromycin (ZITHROMAX) 500 mg in sodium chloride 0.9 % 250 mL IVPB  Status:  Discontinued     500 mg 250 mL/hr over 60 Minutes Intravenous Every 24 hours 09/14/19 1235 09/14/19 1510   09/15/19 0900  cefTRIAXone (ROCEPHIN) 1 g in sodium chloride 0.9 % 100 mL IVPB  Status:  Discontinued     1 g 200 mL/hr over 30 Minutes Intravenous Every 24 hours 09/14/19 1235 09/14/19 1510   09/14/19 0900  levofloxacin (LEVAQUIN) IVPB 750 mg     750 mg 100 mL/hr over 90 Minutes Intravenous  Once 09/14/19 0846 09/14/19 1058     Subjective: Today, patient was seen and examined at bedside.  Patient stated that she had nosebleeding yesterday and could not sleep well, breathing is better but he still has some dyspnea on exertion.  Has mild cough.  Objective: Vitals:   09/16/19 0957 09/16/19 1012  BP: (!) 157/58   Pulse: 71 72  Resp: 18 18  Temp:    SpO2: 100% 99%    Intake/Output Summary (Last 24 hours) at 09/16/2019 1130 Last data filed at 09/16/2019 0908 Gross per 24 hour  Intake 1030 ml  Output 1850 ml  Net -820 ml   Filed Weights   09/14/19 0735 09/14/19 1500  Weight: 81.6 kg 82.5 kg   Body mass index is 31.22 kg/m.   Physical Exam: GENERAL: Patient is alert awake and oriented. Not in obvious distress.  On nasal cannula oxygen. HENT: No scleral pallor or icterus. Pupils equally reactive to light. Oral mucosa is moist.  Pharynx with old blood from epistaxis. NECK: is supple,  no gross swelling noted. CHEST: Coarse breath sounds noted bilaterally.  Diminished breath sounds bilaterally. CVS: S1 and S2 heard, no murmur. Regular rate and rhythm.  ABDOMEN: Soft, non-tender, bowel sounds are present. EXTREMITIES: No edema. CNS: Cranial nerves are intact. No focal motor deficits. SKIN: warm and dry without rashes.  Data Review: I have personally reviewed the following laboratory data and studies,  CBC: Recent Labs  Lab 09/14/19 0736 09/15/19 0334 09/16/19 0328  WBC 27.5* 17.4* 14.7*  NEUTROABS 23.7*  --   --   HGB 8.8* 8.4* 8.3*  HCT 26.1* 25.8* 24.6*  MCV 90.6 92.1 89.5  PLT 212 208 485   Basic Metabolic Panel: Recent Labs  Lab 09/14/19 0736 09/14/19 1551 09/16/19 0328  NA 134* 136 135  K 3.8 4.0 3.7  CL 101 104 102  CO2 20* 23 20*  GLUCOSE 248* 204* 196*  BUN 49* 48* 47*  CREATININE 1.82* 1.89* 1.58*  CALCIUM  8.9 8.9 8.9  MG  --   --  1.7   Liver Function Tests: Recent Labs  Lab 09/14/19 0736  AST 42*  ALT 30  ALKPHOS 170*  BILITOT 1.4*  PROT 7.4  ALBUMIN 3.3*   No results for input(s): LIPASE, AMYLASE in the last 168 hours. No results for input(s): AMMONIA in the last 168 hours. Cardiac Enzymes: No results for input(s): CKTOTAL, CKMB, CKMBINDEX, TROPONINI in the last 168 hours. BNP (last 3 results) Recent Labs    09/14/19 0736  BNP 366.0*    ProBNP (last 3 results) No results for input(s): PROBNP in the last 8760 hours.  CBG: No results for input(s): GLUCAP in the last 168 hours. Recent Results (from the past 240 hour(s))  SARS CORONAVIRUS 2 (TAT 6-24 HRS) Nasopharyngeal Nasopharyngeal Swab     Status: None   Collection Time: 09/14/19 12:51 PM   Specimen: Nasopharyngeal Swab  Result Value Ref Range Status   SARS Coronavirus 2 NEGATIVE NEGATIVE Final    Comment: (NOTE) SARS-CoV-2 target nucleic acids are NOT DETECTED. The SARS-CoV-2 RNA is generally detectable in upper and lower respiratory specimens during the acute  phase of infection. Negative results do not preclude SARS-CoV-2 infection, do not rule out co-infections with other pathogens, and should not be used as the sole basis for treatment or other patient management decisions. Negative results must be combined with clinical observations, patient history, and epidemiological information. The expected result is Negative. Fact Sheet for Patients: SugarRoll.be Fact Sheet for Healthcare Providers: https://www.woods-mathews.com/ This test is not yet approved or cleared by the [place] FDA and  has been authorized for detection and/or diagnosis of SARS-CoV-2 by FDA under an Emergency Use Authorization (EUA). This EUA will remain  in effect (meaning this test can be used) for the duration of the COVID-19 declaration under Section 56 4(b)(1) of the Act, 21 U.S.C. section 360bbb-3(b)(1), unless the authorization is terminated or revoked sooner. Performed at [facility], [place] [address], [place], [place] [address]      Studies: No results found.    Flora Lipps, MD  [facility] 09/16/2019

## 2019-09-16 NOTE — Progress Notes (Signed)
Nose bleeding subsided but still has smear of blood on left nostril when kleenex been tapped on the nose; paged provider on call for review and ordered to give another 2 puff neo-synephrine. Will monitor pt.

## 2019-09-16 NOTE — Evaluation (Signed)
Physical Therapy Evaluation Patient Details Name: Patricia Mata MRN: [id number] DOB: 1955/12/04 Today's Date: 09/16/2019   History of Present Illness  64 year old female with admitting diagnosis of PNA (community acquired); PMH includes HTN, Gout, DM, HTN, colon cancer, and anemia.  Clinical Impression  Patient was in [facility] chair when PT arrived. She is pleasant and motivated, receptive to PT. Indicates that she has been taking PT as an outpatient twice weekly and intends to continue this program. She lives with spouse in one level home, with 4 outside steps with handrails. Walk in shower with grab bars, no seat. Commode is elevated. She does not use any AD for ambulation and has been independent in all bathing and dressing. She relates that spouse is in fairly good health and helps regarding driving for groceries and appts. She demonstrates generalized weakness in CORE and LEs. Should benefit from PT to further address goals for optimal functional outcomes. Possible discharge in next 24-48 hours per patient.     Follow Up Recommendations Outpatient PT(She has been taking outpatient PT twice weekly per patient.)    Equipment Recommendations       Recommendations for Other Services       Precautions / Restrictions Precautions Precautions: None Restrictions Weight Bearing Restrictions: No      Mobility  Bed Mobility Overal bed mobility: Modified Independent                Transfers Overall transfer level: Independent Equipment used: None                Ambulation/Gait Ambulation/Gait assistance: Independent Gait Distance (Feet): 12 Feet Assistive device: None Gait Pattern/deviations: WFL(Within Functional Limits)   Gait velocity interpretation: 1.31 - 2.62 ft/sec, indicative of limited community Conservation officer, historic buildings Rankin (Stroke Patients Only)       Balance Overall balance assessment: Mild deficits observed,  not formally tested                                           Pertinent Vitals/Pain Pain Assessment: No/denies pain    Home Living Family/patient expects to be discharged to:: Private residence Living Arrangements: Spouse/significant other Available Help at Discharge: Family   Home Access: Stairs to enter Entrance Stairs-Rails: Can reach Chartered loss adjuster of Steps: 4 Home Layout: One level Home Equipment: Grab bars - tub/shower Additional Comments: She states that she had used AD in the past, but had "learned to walk again and weaned herself off any need for RW, etc"    Prior Function Level of Independence: Independent               Hand Dominance   Dominant Hand: Right    Extremity/Trunk Assessment        Lower Extremity Assessment Lower Extremity Assessment: Generalized weakness    Cervical / Trunk Assessment Cervical / Trunk Assessment: Normal  Communication   Communication: No difficulties  Cognition Arousal/Alertness: Awake/alert   Overall Cognitive Status: Within Functional Limits for tasks assessed                                 General Comments: She is very pleasant and receptive to PT, motivated.      General  Comments General comments (skin integrity, edema, etc.): Monitor O2 sats, but was on RA during PT evaluation.    Exercises General Exercises - Lower Extremity Ankle Circles/Pumps: AROM;Seated Quad Sets: AROM;Seated Short Arc Quad: AROM;Seated Hip ABduction/ADduction: AROM;Seated Hip Flexion/Marching: AROM;Seated Other Exercises Other Exercises: Practiced pursed lip breathing Other Exercises: Gluteal squeeze with good return demo   Assessment/Plan    PT Assessment- Generalized weakness    PT Problem List- generalized weakness and reduced endurance         PT Treatment Interventions      PT Goals (Current goals can be found in the Care Plan section)  Acute Rehab PT  Goals Patient Stated Goal: Just to get well and stay well, get home and continue PT Time For Goal Achievement: 09/30/19 Potential to Achieve Goals: Good    Frequency     Barriers to discharge        Co-evaluation               AM-PAC PT "6 Clicks" Mobility  Outcome Measure Help needed turning from your back to your side while in a flat bed without using bedrails?: None Help needed moving from lying on your back to sitting on the side of a flat bed without using bedrails?: None Help needed moving to and from a bed to a chair (including a wheelchair)?: A Little Help needed standing up from a chair using your arms (e.g., wheelchair or bedside chair)?: None Help needed to walk in hospital room?: A Little Help needed climbing 3-5 steps with a railing? : A Little 6 Click Score: 21    End of Session   Activity Tolerance: Patient tolerated treatment well Patient left: in chair;with call bell/phone within reach Nurse Communication: Mobility status PT Visit Diagnosis: Muscle weakness (generalized) (M62.81)    Time: 2482-5003 PT Time Calculation (min) (ACUTE ONLY): 44 min   Charges:   PT Evaluation $PT Eval Moderate Complexity: 1 Mod PT Treatments $Therapeutic Exercise: 8-22 mins $Therapeutic Activity: 8-22 mins       Rollen Sox, PT # [id number] CGV cel  Casandra Doffing 09/16/2019, 12:50 PM

## 2019-09-17 DIAGNOSIS — D649 Anemia, unspecified: Secondary | ICD-10-CM

## 2019-09-17 LAB — BASIC METABOLIC PANEL
Anion gap: 11 (ref 5–15)
BUN: 45 mg/dL — ABNORMAL HIGH (ref 8–23)
CO2: 21 mmol/L — ABNORMAL LOW (ref 22–32)
Calcium: 9 mg/dL (ref 8.9–10.3)
Chloride: 105 mmol/L (ref 98–111)
Creatinine, Ser: 1.8 mg/dL — ABNORMAL HIGH (ref 0.44–1.00)
GFR calc Af Amer: 34 mL/min — ABNORMAL LOW (ref >60)
GFR calc non Af Amer: 29 mL/min — ABNORMAL LOW (ref >60)
Glucose, Bld: 173 mg/dL — ABNORMAL HIGH (ref 70–99)
Potassium: 3.7 mmol/L (ref 3.5–5.1)
Sodium: 137 mmol/L (ref 135–145)

## 2019-09-17 LAB — CBC
HCT: 25.6 % — ABNORMAL LOW (ref 36.0–46.0)
Hemoglobin: 8.3 g/dL — ABNORMAL LOW (ref 12.0–15.0)
MCH: 29.7 pg (ref 26.0–34.0)
MCHC: 32.4 g/dL (ref 30.0–36.0)
MCV: 91.8 fL (ref 80.0–100.0)
Platelets: 238 10*3/uL (ref 150–400)
RBC: 2.79 MIL/uL — ABNORMAL LOW (ref 3.87–5.11)
RDW: 20.4 % — ABNORMAL HIGH (ref 11.5–15.5)
WBC: 14.6 10*3/uL — ABNORMAL HIGH (ref 4.0–10.5)
nRBC: 0.5 % — ABNORMAL HIGH (ref 0.0–0.2)

## 2019-09-17 LAB — MAGNESIUM: Magnesium: 1.7 mg/dL (ref 1.7–2.4)

## 2019-09-17 MED ORDER — CEFDINIR 300 MG PO CAPS: 300.0000 mg | ORAL_CAPSULE | Freq: Every day | ORAL | 0 refills | Status: DC

## 2019-09-17 MED ORDER — GUAIFENESIN-DM 100-10 MG/5ML PO SYRP: 5.0000 mL | ORAL_SOLUTION | ORAL | 0 refills | Status: DC | PRN

## 2019-09-17 MED ORDER — DOXYCYCLINE HYCLATE 100 MG PO TABS: 100.0000 mg | ORAL_TABLET | Freq: Two times a day (BID) | ORAL | 0 refills | Status: DC

## 2019-09-17 NOTE — Discharge Summary (Signed)
Physician Discharge Summary  Patricia Mata OMV:[id number] DOB: [birth year] DOA: 09/14/2019  PCP: [name], FNP  Admit date: 09/14/2019 Discharge date: 09/17/2019  Admitted From: Home  Discharge disposition: Home  Recommendations for Outpatient Follow-Up:   . Follow up with your primary care provider in one week.  . Check CBC, BMP in the next visit . Patient will need to have x-ray of her chest in 3 to 4 weeks after completion of antibiotic.   Discharge Diagnosis:   Principal Problem:   CAP (community acquired pneumonia) Active Problems:   Anemia in chronic kidney disease   Essential hypertension   Hyperlipidemia   Type 2 diabetes mellitus with hemoglobin A1c goal of less than 7.0% ([place])   Hypoxia   Discharge Condition: Improved.  Diet recommendation: Low sodium, heart healthy.  Carbohydrate-modified.    Wound care: None.  Code status: Full.   History of Present Illness:   The patient is a55 yo female with past medical history of diabetes mellitus, hypertension, gout and colon cancer who presented to the hospital with cough shortness of breath with yellow-greenish phlegm production.  In the ED, patient was noted to have a bilateral pulmonary infiltrates consistent with pneumonia. Labs showed Na 134 BUN 49, creat 1.8 (baseline 1.8- 2.4). WBC 27k, Hb 8.8 and platelets were within normal levels.  Pt was given IV Rocephin.  Patient follows up with [facility] as outpatient.  Hospital Course:   Following conditions were addressed during hospitalization as listed below,  Multi-focal bilateral community-acquired pneumonia with acute hypoxic respiratory failure.   Patient received Rocephin and doxycycline during hospitalization..  Patient was initially on supplemental oxygen which has been weaned off.  At this time patient has a significantly felt better.  Afebrile.  Leukocytosis trending down.  HIV non reactive.  COVID-19 was negative this time.  Patient was +6  months back.  BNP mildly elevated at 366.  No blood cultures were drawn.  Continue inhaled steroids.   Epistaxis  History of myelodysplastic syndrome.  Not on anticoagulation.  Spontaneously resolved at this time.  Patient was extensively counseled regarding not blowing the nose.    Hemoglobin of 8.3  CKD stage IV.  Baseline creatinine of 1.8-2.4.  Euvolemic.  Creatinine of 1.8 today.  Patient follows up with [facility] as outpatient.  Essential hypertension.  Continue on doxazosin, Lasix, hydralazine.   Diabetes mellitus type 2.   Continue Lipitor, Lantus, Januvia   History of pulmonary hypertension.  On sildenafil.  Will continue.  History of chronic atrial fibrillation.  Continue amiodarone.  Rate controlled at this time.  Patient is not on anticoagulation likely secondary to myelodysplastic syndrome/anemia and recurrent epistaxis.  History of gout.  No acute issues.  On allopurinol and colchicine at home.  Will continue.Marland Kitchen  History of hypothyroidism.  Continue Synthroid.  Debility weakness.  Physical therapy recommend outpatient PT.  History of myelodysplastic syndrome.  Followed by Dr. Alen Blew, oncology as outpatient.    Hemoglobin of 8.38.5 on presentation.  Disposition.  At this time, patient is stable for disposition home with outpatient PT.  Medical Consultants:    None.  Procedures:    None Subjective:   Today, patient feels well.  Wants to go home.  Has mild cough but no chest pain.  Has been weaned off oxygen.  Discharge Exam:   Vitals:   09/16/19 2107 09/17/19 0434  BP: (!) 159/51 (!) 163/59  Pulse: 75 80  Resp: 18 18  Temp: 98.3 F (36.8 C)  98.4 F (36.9 C)  SpO2: 93% 92%   Vitals:   09/16/19 1306 09/16/19 2107 09/17/19 0434 09/17/19 0443  BP: (!) 163/58 (!) 159/51 (!) 163/59   Pulse: 81 75 80   Resp: '16 18 18   ' Temp: 97.7 F (36.5 C) 98.3 F (36.8 C) 98.4 F (36.9 C)   TempSrc: Oral Oral Oral   SpO2: 98% 93% 92%     Weight:    82.1 kg  Height:        General: Alert awake, not in obvious distress HENT: pupils equally reacting to light,  No scleral pallor or icterus noted. Oral mucosa is moist.  Chest: Coarse breath sounds noted bilaterally. CVS: S1 &S2 heard. No murmur.  Regular rate and rhythm. Abdomen: Soft, nontender, nondistended.  Bowel sounds are heard.   Extremities: No cyanosis, clubbing or edema.  Peripheral pulses are palpable. Psych: Alert, awake and oriented, normal mood CNS:  No cranial nerve deficits.  Power equal in all extremities.   Skin: Warm and dry.  No rashes noted.  The results of significant diagnostics from this hospitalization (including imaging, microbiology, ancillary and laboratory) are listed below for reference.     Diagnostic Studies:   DG Chest Port 1 View  Result Date: 09/14/2019 CLINICAL DATA:  Shortness of breath, cough, congestion EXAM: PORTABLE CHEST 1 VIEW COMPARISON:  05/21/2019 FINDINGS: Bilateral patchy airspace disease noted concerning for pneumonia. Heart is borderline in size. Possible small effusions. No acute bony abnormality. IMPRESSION: Patchy bilateral airspace opacities concerning for pneumonia. Suspect small bilateral effusions. Electronically Signed   By: Rolm Baptise M.D.   On: 09/14/2019 08:17     Labs:   Basic Metabolic Panel: Recent Labs  Lab 09/14/19 0736 09/14/19 0736 09/14/19 1551 09/14/19 1551 09/16/19 0328 09/17/19 0501  NA 134*  --  136  --  135 137  K 3.8   < > 4.0   < > 3.7 3.7  CL 101  --  104  --  102 105  CO2 20*  --  23  --  20* 21*  GLUCOSE 248*  --  204*  --  196* 173*  BUN 49*  --  48*  --  47* 45*  CREATININE 1.82*  --  1.89*  --  1.58* 1.80*  CALCIUM 8.9  --  8.9  --  8.9 9.0  MG  --   --   --   --  1.7 1.7   < > = values in this interval not displayed.   GFR Estimated Creatinine Clearance: 32.7 mL/min (A) (by C-G formula based on SCr of 1.8 mg/dL (H)). Liver Function Tests: Recent Labs  Lab 09/14/19 0736   AST 42*  ALT 30  ALKPHOS 170*  BILITOT 1.4*  PROT 7.4  ALBUMIN 3.3*   No results for input(s): LIPASE, AMYLASE in the last 168 hours. No results for input(s): AMMONIA in the last 168 hours. Coagulation profile No results for input(s): INR, PROTIME in the last 168 hours.  CBC: Recent Labs  Lab 09/14/19 0736 09/15/19 0334 09/16/19 0328 09/17/19 0501  WBC 27.5* 17.4* 14.7* 14.6*  NEUTROABS 23.7*  --   --   --   HGB 8.8* 8.4* 8.3* 8.3*  HCT 26.1* 25.8* 24.6* 25.6*  MCV 90.6 92.1 89.5 91.8  PLT 212 208 206 238   Cardiac Enzymes: No results for input(s): CKTOTAL, CKMB, CKMBINDEX, TROPONINI in the last 168 hours. BNP: Invalid input(s): POCBNP CBG: No results for input(s): GLUCAP in the last  168 hours. D-Dimer No results for input(s): DDIMER in the last 72 hours. Hgb A1c No results for input(s): HGBA1C in the last 72 hours. Lipid Profile No results for input(s): CHOL, HDL, LDLCALC, TRIG, CHOLHDL, LDLDIRECT in the last 72 hours. Thyroid function studies No results for input(s): TSH, T4TOTAL, T3FREE, THYROIDAB in the last 72 hours.  Invalid input(s): FREET3 Anemia work up No results for input(s): VITAMINB12, FOLATE, FERRITIN, TIBC, IRON, RETICCTPCT in the last 72 hours. Microbiology Recent Results (from the past 240 hour(s))  SARS CORONAVIRUS 2 (TAT 6-24 HRS) Nasopharyngeal Nasopharyngeal Swab     Status: None   Collection Time: 09/14/19 12:51 PM   Specimen: Nasopharyngeal Swab  Result Value Ref Range Status   SARS Coronavirus 2 NEGATIVE NEGATIVE Final    Comment: (NOTE) SARS-CoV-2 target nucleic acids are NOT DETECTED. The SARS-CoV-2 RNA is generally detectable in upper and lower respiratory specimens during the acute phase of infection. Negative results do not preclude SARS-CoV-2 infection, do not rule out co-infections with other pathogens, and should not be used as the sole basis for treatment or other patient management decisions. Negative results must be  combined with clinical observations, patient history, and epidemiological information. The expected result is Negative. Fact Sheet for Patients: SugarRoll.be Fact Sheet for Healthcare Providers: https://www.woods-mathews.com/ This test is not yet approved or cleared by the [place] FDA and  has been authorized for detection and/or diagnosis of SARS-CoV-2 by FDA under an Emergency Use Authorization (EUA). This EUA will remain  in effect (meaning this test can be used) for the duration of the COVID-19 declaration under Section 56 4(b)(1) of the Act, 21 U.S.C. section 360bbb-3(b)(1), unless the authorization is terminated or revoked sooner. Performed at [facility], [place] [address], [place], [place] [address]      Discharge Instructions:   Discharge Instructions    Diet - low sodium heart healthy   Complete by: As directed    Diet Carb Modified   Complete by: As directed    Discharge instructions   Complete by: As directed    Follow-up with your primary care physician in 1 week.  Seek medical attention for worsening symptoms.  Continue the course of antibiotic.   Increase activity slowly   Complete by: As directed      Allergies as of 09/17/2019      Reactions   Ancef [cefazolin] Itching   Severe itching- after procedure, ancef was the antibiotic.-04/02/17 Tolerates penicillins   Sulfa Antibiotics Rash, Other (See Comments)   Blisters, also      Medication List    TAKE these medications   allopurinol 100 MG tablet Commonly known as: ZYLOPRIM Take 100 mg by mouth daily.   amiodarone 200 MG tablet Commonly known as: PACERONE TAKE 1 TABLET BY MOUTH EVERY DAY   amLODipine 10 MG tablet Commonly known as: NORVASC Take 1 tablet (10 mg total) by mouth daily.   atorvastatin 40 MG tablet Commonly known as: LIPITOR TAKE 1 TABLET BY MOUTH EVERY DAY What changed: when to take this   blood glucose meter kit and  supplies Dispense based on patient and insurance preference. Use up to four times daily as directed. (FOR ICD-10 E10.9, E11.9).   cefdinir 300 MG capsule Commonly known as: OMNICEF Take 1 capsule (300 mg total) by mouth daily for 4 days.   Centrum Silver 50+Women Tabs Take 1 tablet by mouth daily.   colchicine 0.6 MG tablet TAKE 1 TABLET BY MOUTH TWICE A DAY AS NEEDED  FOR GOUT What changed: See the new instructions.   doxazosin 1 MG tablet Commonly known as: Cardura Take 1 tablet (1 mg total) by mouth at bedtime.   doxycycline 100 MG tablet Commonly known as: VIBRA-TABS Take 1 tablet (100 mg total) by mouth 2 (two) times daily for 4 days.   furosemide 40 MG tablet Commonly known as: LASIX TAKE 1 TABLET BY MOUTH EVERY DAY   glucose blood test strip Use as instructed   guaiFENesin-dextromethorphan 100-10 MG/5ML syrup Commonly known as: ROBITUSSIN DM Take 5 mLs by mouth every 4 (four) hours as needed for cough.   hydrALAZINE 100 MG tablet Commonly known as: APRESOLINE Take 1 tablet (100 mg total) by mouth 3 (three) times daily.   insulin glargine 100 unit/mL Sopn Commonly known as: LANTUS Inject 0.15 mLs (15 Units total) into the skin daily. What changed:   how much to take  when to take this  reasons to take this   Insulin Pen Needle 29G X 5MM Misc Use as directed   Januvia 50 MG tablet Generic drug: sitaGLIPtin TAKE 1 TABLET BY MOUTH EVERY DAY What changed: how much to take   latanoprost 0.005 % ophthalmic solution Commonly known as: XALATAN Place 1 drop into both eyes at bedtime.   levothyroxine 88 MCG tablet Commonly known as: Synthroid Take 1 tablet (88 mcg total) by mouth daily.   OneTouch UltraLink w/Device Kit Check blood sugars twice daily E11.9   onetouch ultrasoft lancets Use as instructed to check blood sugars twice daily E11.9   oxymetazoline 0.05 % nasal spray Commonly known as: AFRIN Place 1 spray into both nostrils 2 (two) times  daily as needed for congestion.   sildenafil 20 MG tablet Commonly known as: REVATIO Take 1 tablet (20 mg total) by mouth 3 (three) times daily.         Time coordinating discharge: 39 minutes  Signed:  Aaniyah Strohm  [facility] 09/17/2019, 8:42 AM

## 2019-09-17 NOTE — Progress Notes (Signed)
Pt discharge to home, instruction reviewed with patient, acknowledged understanding. Antibiotic complete. SRP, RN

## 2019-09-17 NOTE — Care Management Important Message (Signed)
Important Message  Patient Details IM Letter given to Gabriel Earing RN Case Manager to present to the Patient Name: Patricia Mata MRN: [id number] Date of Birth: 06-08-1956   Medicare Important Message Given:  Yes     Kerin Salen 09/17/2019, 10:41 AM

## 2019-09-20 ENCOUNTER — Telehealth: Payer: Self-pay

## 2019-09-20 ENCOUNTER — Encounter (HOSPITAL_COMMUNITY): Payer: Self-pay | Admitting: Family Medicine

## 2019-09-20 ENCOUNTER — Telehealth (INDEPENDENT_AMBULATORY_CARE_PROVIDER_SITE_OTHER): Payer: PPO | Admitting: Nurse Practitioner

## 2019-09-20 ENCOUNTER — Other Ambulatory Visit: Payer: Self-pay

## 2019-09-20 ENCOUNTER — Encounter: Payer: Self-pay | Admitting: Nurse Practitioner

## 2019-09-20 ENCOUNTER — Inpatient Hospital Stay (HOSPITAL_COMMUNITY): Payer: PPO

## 2019-09-20 ENCOUNTER — Ambulatory Visit: Payer: BC Managed Care – PPO | Admitting: Nurse Practitioner

## 2019-09-20 ENCOUNTER — Inpatient Hospital Stay (HOSPITAL_COMMUNITY)
Admission: EM | Admit: 2019-09-20 | Discharge: 2019-10-03 | DRG: 871 | Disposition: A | Discharge status: Home Health | Payer: PPO | Attending: Internal Medicine | Admitting: Internal Medicine

## 2019-09-20 ENCOUNTER — Emergency Department (HOSPITAL_COMMUNITY): Payer: PPO

## 2019-09-20 VITALS — BP 136/48 | HR 80

## 2019-09-20 DIAGNOSIS — Z85038 Personal history of other malignant neoplasm of large intestine: Secondary | ICD-10-CM

## 2019-09-20 DIAGNOSIS — A419 Sepsis, unspecified organism: Principal | ICD-10-CM | POA: Diagnosis present

## 2019-09-20 DIAGNOSIS — Z79899 Other long term (current) drug therapy: Secondary | ICD-10-CM

## 2019-09-20 DIAGNOSIS — I2721 Secondary pulmonary arterial hypertension: Secondary | ICD-10-CM | POA: Diagnosis not present

## 2019-09-20 DIAGNOSIS — Z20822 Contact with and (suspected) exposure to covid-19: Secondary | ICD-10-CM | POA: Diagnosis not present

## 2019-09-20 DIAGNOSIS — N189 Chronic kidney disease, unspecified: Secondary | ICD-10-CM | POA: Diagnosis present

## 2019-09-20 DIAGNOSIS — R778 Other specified abnormalities of plasma proteins: Secondary | ICD-10-CM

## 2019-09-20 DIAGNOSIS — I352 Nonrheumatic aortic (valve) stenosis with insufficiency: Secondary | ICD-10-CM | POA: Diagnosis not present

## 2019-09-20 DIAGNOSIS — C946 Myelodysplastic disease, not classified: Secondary | ICD-10-CM | POA: Diagnosis present

## 2019-09-20 DIAGNOSIS — R918 Other nonspecific abnormal finding of lung field: Secondary | ICD-10-CM | POA: Diagnosis not present

## 2019-09-20 DIAGNOSIS — J189 Pneumonia, unspecified organism: Secondary | ICD-10-CM | POA: Diagnosis not present

## 2019-09-20 DIAGNOSIS — I248 Other forms of acute ischemic heart disease: Secondary | ICD-10-CM | POA: Diagnosis not present

## 2019-09-20 DIAGNOSIS — Z794 Long term (current) use of insulin: Secondary | ICD-10-CM

## 2019-09-20 DIAGNOSIS — I272 Pulmonary hypertension, unspecified: Secondary | ICD-10-CM | POA: Diagnosis present

## 2019-09-20 DIAGNOSIS — Z882 Allergy status to sulfonamides status: Secondary | ICD-10-CM

## 2019-09-20 DIAGNOSIS — M109 Gout, unspecified: Secondary | ICD-10-CM | POA: Diagnosis present

## 2019-09-20 DIAGNOSIS — E11649 Type 2 diabetes mellitus with hypoglycemia without coma: Secondary | ICD-10-CM | POA: Diagnosis present

## 2019-09-20 DIAGNOSIS — J9809 Other diseases of bronchus, not elsewhere classified: Secondary | ICD-10-CM | POA: Diagnosis not present

## 2019-09-20 DIAGNOSIS — Z881 Allergy status to other antibiotic agents status: Secondary | ICD-10-CM

## 2019-09-20 DIAGNOSIS — E1122 Type 2 diabetes mellitus with diabetic chronic kidney disease: Secondary | ICD-10-CM | POA: Diagnosis not present

## 2019-09-20 DIAGNOSIS — J9690 Respiratory failure, unspecified, unspecified whether with hypoxia or hypercapnia: Secondary | ICD-10-CM

## 2019-09-20 DIAGNOSIS — I10 Essential (primary) hypertension: Secondary | ICD-10-CM | POA: Diagnosis present

## 2019-09-20 DIAGNOSIS — D469 Myelodysplastic syndrome, unspecified: Secondary | ICD-10-CM | POA: Diagnosis not present

## 2019-09-20 DIAGNOSIS — E038 Other specified hypothyroidism: Secondary | ICD-10-CM

## 2019-09-20 DIAGNOSIS — Z4682 Encounter for fitting and adjustment of non-vascular catheter: Secondary | ICD-10-CM | POA: Diagnosis not present

## 2019-09-20 DIAGNOSIS — Z4659 Encounter for fitting and adjustment of other gastrointestinal appliance and device: Secondary | ICD-10-CM

## 2019-09-20 DIAGNOSIS — D631 Anemia in chronic kidney disease: Secondary | ICD-10-CM | POA: Diagnosis not present

## 2019-09-20 DIAGNOSIS — I959 Hypotension, unspecified: Secondary | ICD-10-CM | POA: Diagnosis not present

## 2019-09-20 DIAGNOSIS — Z9049 Acquired absence of other specified parts of digestive tract: Secondary | ICD-10-CM

## 2019-09-20 DIAGNOSIS — R0602 Shortness of breath: Secondary | ICD-10-CM | POA: Diagnosis not present

## 2019-09-20 DIAGNOSIS — R000 Tachycardia, unspecified: Secondary | ICD-10-CM | POA: Diagnosis not present

## 2019-09-20 DIAGNOSIS — J80 Acute respiratory distress syndrome: Secondary | ICD-10-CM | POA: Diagnosis present

## 2019-09-20 DIAGNOSIS — E039 Hypothyroidism, unspecified: Secondary | ICD-10-CM | POA: Diagnosis present

## 2019-09-20 DIAGNOSIS — N179 Acute kidney failure, unspecified: Secondary | ICD-10-CM | POA: Diagnosis present

## 2019-09-20 DIAGNOSIS — E669 Obesity, unspecified: Secondary | ICD-10-CM | POA: Diagnosis present

## 2019-09-20 DIAGNOSIS — J811 Chronic pulmonary edema: Secondary | ICD-10-CM | POA: Diagnosis not present

## 2019-09-20 DIAGNOSIS — I130 Hypertensive heart and chronic kidney disease with heart failure and stage 1 through stage 4 chronic kidney disease, or unspecified chronic kidney disease: Secondary | ICD-10-CM | POA: Diagnosis present

## 2019-09-20 DIAGNOSIS — R29810 Facial weakness: Secondary | ICD-10-CM | POA: Diagnosis present

## 2019-09-20 DIAGNOSIS — J90 Pleural effusion, not elsewhere classified: Secondary | ICD-10-CM | POA: Diagnosis not present

## 2019-09-20 DIAGNOSIS — Z79890 Hormone replacement therapy (postmenopausal): Secondary | ICD-10-CM

## 2019-09-20 DIAGNOSIS — E278 Other specified disorders of adrenal gland: Secondary | ICD-10-CM | POA: Diagnosis present

## 2019-09-20 DIAGNOSIS — E119 Type 2 diabetes mellitus without complications: Secondary | ICD-10-CM

## 2019-09-20 DIAGNOSIS — E7800 Pure hypercholesterolemia, unspecified: Secondary | ICD-10-CM | POA: Diagnosis not present

## 2019-09-20 DIAGNOSIS — N183 Chronic kidney disease, stage 3 unspecified: Secondary | ICD-10-CM | POA: Diagnosis not present

## 2019-09-20 DIAGNOSIS — N1832 Chronic kidney disease, stage 3b: Secondary | ICD-10-CM | POA: Diagnosis not present

## 2019-09-20 DIAGNOSIS — Z01818 Encounter for other preprocedural examination: Secondary | ICD-10-CM

## 2019-09-20 DIAGNOSIS — R0603 Acute respiratory distress: Secondary | ICD-10-CM

## 2019-09-20 DIAGNOSIS — Z8616 Personal history of COVID-19: Secondary | ICD-10-CM | POA: Diagnosis not present

## 2019-09-20 DIAGNOSIS — R579 Shock, unspecified: Secondary | ICD-10-CM | POA: Diagnosis not present

## 2019-09-20 DIAGNOSIS — I2510 Atherosclerotic heart disease of native coronary artery without angina pectoris: Secondary | ICD-10-CM | POA: Diagnosis present

## 2019-09-20 DIAGNOSIS — E872 Acidosis: Secondary | ICD-10-CM | POA: Diagnosis present

## 2019-09-20 DIAGNOSIS — I5031 Acute diastolic (congestive) heart failure: Secondary | ICD-10-CM | POA: Diagnosis not present

## 2019-09-20 DIAGNOSIS — Z7289 Other problems related to lifestyle: Secondary | ICD-10-CM

## 2019-09-20 DIAGNOSIS — L299 Pruritus, unspecified: Secondary | ICD-10-CM | POA: Diagnosis not present

## 2019-09-20 DIAGNOSIS — I083 Combined rheumatic disorders of mitral, aortic and tricuspid valves: Secondary | ICD-10-CM | POA: Diagnosis present

## 2019-09-20 DIAGNOSIS — E785 Hyperlipidemia, unspecified: Secondary | ICD-10-CM | POA: Diagnosis not present

## 2019-09-20 DIAGNOSIS — Y95 Nosocomial condition: Secondary | ICD-10-CM | POA: Diagnosis present

## 2019-09-20 DIAGNOSIS — I5033 Acute on chronic diastolic (congestive) heart failure: Secondary | ICD-10-CM | POA: Diagnosis not present

## 2019-09-20 DIAGNOSIS — I480 Paroxysmal atrial fibrillation: Secondary | ICD-10-CM

## 2019-09-20 DIAGNOSIS — Z833 Family history of diabetes mellitus: Secondary | ICD-10-CM

## 2019-09-20 DIAGNOSIS — I4891 Unspecified atrial fibrillation: Secondary | ICD-10-CM

## 2019-09-20 DIAGNOSIS — I350 Nonrheumatic aortic (valve) stenosis: Secondary | ICD-10-CM

## 2019-09-20 DIAGNOSIS — Z452 Encounter for adjustment and management of vascular access device: Secondary | ICD-10-CM

## 2019-09-20 DIAGNOSIS — R0902 Hypoxemia: Secondary | ICD-10-CM

## 2019-09-20 DIAGNOSIS — J9621 Acute and chronic respiratory failure with hypoxia: Secondary | ICD-10-CM | POA: Diagnosis not present

## 2019-09-20 DIAGNOSIS — Z87891 Personal history of nicotine dependence: Secondary | ICD-10-CM

## 2019-09-20 DIAGNOSIS — I342 Nonrheumatic mitral (valve) stenosis: Secondary | ICD-10-CM | POA: Diagnosis not present

## 2019-09-20 DIAGNOSIS — J984 Other disorders of lung: Secondary | ICD-10-CM | POA: Diagnosis not present

## 2019-09-20 DIAGNOSIS — I340 Nonrheumatic mitral (valve) insufficiency: Secondary | ICD-10-CM | POA: Diagnosis not present

## 2019-09-20 DIAGNOSIS — J9601 Acute respiratory failure with hypoxia: Secondary | ICD-10-CM | POA: Diagnosis present

## 2019-09-20 DIAGNOSIS — D630 Anemia in neoplastic disease: Secondary | ICD-10-CM | POA: Diagnosis present

## 2019-09-20 DIAGNOSIS — I482 Chronic atrial fibrillation, unspecified: Secondary | ICD-10-CM | POA: Diagnosis present

## 2019-09-20 DIAGNOSIS — I700 Atherosclerosis of aorta: Secondary | ICD-10-CM | POA: Diagnosis present

## 2019-09-20 DIAGNOSIS — R7989 Other specified abnormal findings of blood chemistry: Secondary | ICD-10-CM | POA: Diagnosis present

## 2019-09-20 DIAGNOSIS — R6520 Severe sepsis without septic shock: Secondary | ICD-10-CM

## 2019-09-20 DIAGNOSIS — Z8701 Personal history of pneumonia (recurrent): Secondary | ICD-10-CM

## 2019-09-20 DIAGNOSIS — Z6832 Body mass index (BMI) 32.0-32.9, adult: Secondary | ICD-10-CM

## 2019-09-20 DIAGNOSIS — D649 Anemia, unspecified: Secondary | ICD-10-CM

## 2019-09-20 DIAGNOSIS — Z8249 Family history of ischemic heart disease and other diseases of the circulatory system: Secondary | ICD-10-CM

## 2019-09-20 LAB — BLOOD GAS, ARTERIAL
Acid-base deficit: 2.5 mmol/L — ABNORMAL HIGH (ref 0.0–2.0)
Bicarbonate: 21.3 mmol/L (ref 20.0–28.0)
O2 Saturation: 93.3 %
Patient temperature: 98.6
pCO2 arterial: 34.6 mmHg (ref 32.0–48.0)
pH, Arterial: 7.408 (ref 7.350–7.450)
pO2, Arterial: 71.9 mmHg — ABNORMAL LOW (ref 83.0–108.0)

## 2019-09-20 LAB — COMPREHENSIVE METABOLIC PANEL
ALT: 24 U/L (ref 0–44)
AST: 32 U/L (ref 15–41)
Albumin: 2.9 g/dL — ABNORMAL LOW (ref 3.5–5.0)
Alkaline Phosphatase: 147 U/L — ABNORMAL HIGH (ref 38–126)
Anion gap: 13 (ref 5–15)
BUN: 65 mg/dL — ABNORMAL HIGH (ref 8–23)
CO2: 19 mmol/L — ABNORMAL LOW (ref 22–32)
Calcium: 8.9 mg/dL (ref 8.9–10.3)
Chloride: 106 mmol/L (ref 98–111)
Creatinine, Ser: 2.55 mg/dL — ABNORMAL HIGH (ref 0.44–1.00)
GFR calc Af Amer: 22 mL/min — ABNORMAL LOW (ref >60)
GFR calc non Af Amer: 19 mL/min — ABNORMAL LOW (ref >60)
Glucose, Bld: 330 mg/dL — ABNORMAL HIGH (ref 70–99)
Potassium: 4.5 mmol/L (ref 3.5–5.1)
Sodium: 138 mmol/L (ref 135–145)
Total Bilirubin: 0.9 mg/dL (ref 0.3–1.2)
Total Protein: 7.1 g/dL (ref 6.5–8.1)

## 2019-09-20 LAB — TROPONIN I (HIGH SENSITIVITY)
Troponin I (High Sensitivity): 62 ng/L — ABNORMAL HIGH (ref <18)
Troponin I (High Sensitivity): 215 ng/L (ref <18)

## 2019-09-20 LAB — PROCALCITONIN: Procalcitonin: 2.18 ng/mL

## 2019-09-20 LAB — IRON AND TIBC
Iron: 50 ug/dL (ref 28–170)
Saturation Ratios: 27 % (ref 10.4–31.8)
TIBC: 187 ug/dL — ABNORMAL LOW (ref 250–450)
UIBC: 137 ug/dL

## 2019-09-20 LAB — CBC
HCT: 23.4 % — ABNORMAL LOW (ref 36.0–46.0)
Hemoglobin: 7.7 g/dL — ABNORMAL LOW (ref 12.0–15.0)
MCH: 30.1 pg (ref 26.0–34.0)
MCHC: 32.9 g/dL (ref 30.0–36.0)
MCV: 91.4 fL (ref 80.0–100.0)
Platelets: 285 10*3/uL (ref 150–400)
RBC: 2.56 MIL/uL — ABNORMAL LOW (ref 3.87–5.11)
RDW: 21.5 % — ABNORMAL HIGH (ref 11.5–15.5)
WBC: 27.2 10*3/uL — ABNORMAL HIGH (ref 4.0–10.5)
nRBC: 1.1 % — ABNORMAL HIGH (ref 0.0–0.2)

## 2019-09-20 LAB — BRAIN NATRIURETIC PEPTIDE: B Natriuretic Peptide: 548.2 pg/mL — ABNORMAL HIGH (ref 0.0–100.0)

## 2019-09-20 LAB — FIBRINOGEN: Fibrinogen: 585 mg/dL — ABNORMAL HIGH (ref 210–475)

## 2019-09-20 LAB — MAGNESIUM: Magnesium: 1.6 mg/dL — ABNORMAL LOW (ref 1.7–2.4)

## 2019-09-20 LAB — POC SARS CORONAVIRUS 2 AG -  ED: SARS Coronavirus 2 Ag: NEGATIVE

## 2019-09-20 LAB — LACTIC ACID, PLASMA: Lactic Acid, Venous: 2.3 mmol/L (ref 0.5–1.9)

## 2019-09-20 LAB — D-DIMER, QUANTITATIVE: D-Dimer, Quant: 2.53 ug/mL-FEU — ABNORMAL HIGH (ref 0.00–0.50)

## 2019-09-20 LAB — C-REACTIVE PROTEIN: CRP: 19.8 mg/dL — ABNORMAL HIGH (ref <1.0)

## 2019-09-20 LAB — FERRITIN: Ferritin: 1870 ng/mL — ABNORMAL HIGH (ref 11–307)

## 2019-09-20 LAB — LACTATE DEHYDROGENASE: LDH: 250 U/L — ABNORMAL HIGH (ref 98–192)

## 2019-09-20 MED ORDER — HEPARIN (PORCINE) 25000 UT/250ML-% IV SOLN
1000.0000 [IU]/h | INTRAVENOUS | Status: DC
  Administered 2019-09-20: 1000 [IU]/h via INTRAVENOUS
  Filled 2019-09-20: qty 250

## 2019-09-20 MED ORDER — SODIUM CHLORIDE 0.9 % IV SOLN
INTRAVENOUS | Status: DC | PRN
  Administered 2019-09-20 – 2019-09-21 (×2): 500 mL via INTRAVENOUS

## 2019-09-20 MED ORDER — FUROSEMIDE 10 MG/ML IJ SOLN
40.0000 mg | Freq: Once | INTRAMUSCULAR | Status: AC
  Administered 2019-09-21: 40 mg via INTRAVENOUS
  Filled 2019-09-20: qty 4

## 2019-09-20 MED ORDER — SODIUM CHLORIDE 0.9 % IV SOLN
2.0000 g | Freq: Once | INTRAVENOUS | Status: AC
  Administered 2019-09-20: 2 g via INTRAVENOUS
  Filled 2019-09-20: qty 2

## 2019-09-20 MED ORDER — VANCOMYCIN HCL 1500 MG/300ML IV SOLN
1500.0000 mg | Freq: Once | INTRAVENOUS | Status: AC
  Administered 2019-09-20: 1500 mg via INTRAVENOUS
  Filled 2019-09-20: qty 300

## 2019-09-20 NOTE — ED Triage Notes (Signed)
Pt BIBA from hom[facility]e   Per EMS- Pt O2 82% on 4L O2.  94% on NRB.  Lund sounds diminished on right side. EMS reports abnormal EKG.  AOx4,

## 2019-09-20 NOTE — ED Provider Notes (Signed)
[facility] Emergency Department Provider Note MRN:  [id number]  Arrival date & time: 09/20/19     Chief Complaint   Shortness of Breath   History of Present Illness   Patricia Mata is a 64 y.o. year-old female with a history of hypertension, diabetes presenting to the ED with chief complaint of shortness of.  Patient explains that she was recently in the hospital for pneumonia, was discharged last week.  Continued cough, worsening shortness of breath over the past 1 or 2 days that became much worse today.  Denies fever or chills, no chest pain, no abdominal pain, no leg pain or swelling.  Review of Systems  A complete 10 system review of systems was obtained and all systems are negative except as noted in the HPI and PMH.   Patient's Health History    Past Medical History:  Diagnosis Date  . Acute kidney injury (nontraumatic) ([place])   . Acute renal failure (ARF) ([place]) 06/08/2018  . Cancer [facility]) 2008   Colon   . Diabetes mellitus without complication ([place])   . Gout 09/08/2018  . Hypertension   . Macrocytic anemia     Past Surgical History:  Procedure Laterality Date  . COLON SURGERY    . DEBRIDMENT OF DECUBITUS ULCER N/A 06/12/2018   Procedure: DEBRIDMENT OF DECUBITUS ULCER;  Surgeon: Wallace Going, DO;  Location: [facility];  Service: Plastics;  Laterality: N/A;  . IR FLUORO GUIDE PORT INSERTION RIGHT  04/02/2017  . IR REMOVAL TUN ACCESS W/ PORT W/O FL MOD SED  03/16/2019  . IR US GUIDE VASC ACCESS RIGHT  04/02/2017  . RIGHT HEART CATH N/A 04/23/2019   Procedure: RIGHT HEART CATH;  Surgeon: Jolaine Artist, MD;  Location: [place] [facility];  Service: Cardiovascular;  Laterality: N/A;  . RIGHT/LEFT HEART CATH AND CORONARY ANGIOGRAPHY N/A 05/21/2018   Procedure: RIGHT/LEFT HEART CATH AND CORONARY ANGIOGRAPHY;  Surgeon: Nelva Bush, MD;  Location: [place] [facility];  Service: Cardiovascular;  Laterality: N/A;    Family History  Problem Relation  Age of Onset  . Hypertension Mother   . Diabetes Mother   . Cervical cancer Mother   . Heart attack Father   . Hypertension Sister   . Hypertension Brother   . Hypertension Sister   . Hypertension Sister   . Prostate cancer Brother   . HIV/AIDS Brother     Social History   Socioeconomic History  . Marital status: Married    Spouse name: Not on file  . Number of children: Not on file  . Years of education: Not on file  . Highest education level: Not on file  Occupational History  . Not on file  Tobacco Use  . Smoking status: Former Smoker    Packs/day: 0.25    Years: 20.00    Pack years: 5.00    Types: Cigarettes    Quit date: 01/31/2001    Years since quitting: 18.6  . Smokeless tobacco: Former Network engineer and Sexual Activity  . Alcohol use: Yes    Alcohol/week: 2.0 standard drinks    Types: 2 Shots of liquor per week  . Drug use: Never  . Sexual activity: Not on file  Other Topics Concern  . Not on file  Social History Narrative  . Not on file   Social Determinants of Health   Financial Resource Strain:   . Difficulty of Paying Living Expenses: Not on file  Food Insecurity:   . Worried  About Running Out of Food in the Last Year: Not on file  . Ran Out of Food in the Last Year: Not on file  Transportation Needs:   . Lack of Transportation (Medical): Not on file  . Lack of Transportation (Non-Medical): Not on file  Physical Activity:   . Days of Exercise per Week: Not on file  . Minutes of Exercise per Session: Not on file  Stress:   . Feeling of Stress : Not on file  Social Connections:   . Frequency of Communication with Friends and Family: Not on file  . Frequency of Social Gatherings with Friends and Family: Not on file  . Attends Religious Services: Not on file  . Active Member of Clubs or Organizations: Not on file  . Attends Archivist Meetings: Not on file  . Marital Status: Not on file  Intimate Partner Violence:   . Fear of  Current or Ex-Partner: Not on file  . Emotionally Abused: Not on file  . Physically Abused: Not on file  . Sexually Abused: Not on file     Physical Exam   Vitals:   09/20/19 1731 09/20/19 1745  BP:  (!) 138/55  Pulse:  83  Resp:  20  Temp:  98.6 F (37 C)  SpO2: 94% 95%    CONSTITUTIONAL: Well-appearing, NAD NEURO:  Alert and oriented x 3, left facial droop (chronic) EYES:  eyes equal and reactive ENT/NECK:  no LAD, no JVD CARDIO: Regular rate, well-perfused, normal S1 and S2 PULM: Scattered crackles GI/GU:  normal bowel sounds, non-distended, non-tender MSK/SPINE:  No gross deformities, no edema SKIN:  no rash, atraumatic PSYCH:  Appropriate speech and behavior  *Additional and/or pertinent findings included in MDM below  Diagnostic and Interventional Summary    EKG Interpretation  Date/Time:  Monday September 20 2019 17:43:51 EST Ventricular Rate:  84 PR Interval:    QRS Duration: 89 QT Interval:  372 QTC Calculation: 440 R Axis:   10 Text Interpretation: Sinus rhythm Minimal ST depression, lateral leads Minimal ST elevation, anterior leads Confirmed by Gerlene Fee [phone]) on 09/20/2019 6:38:12 PM      Cardiac Monitoring Interpretation:  Labs Reviewed  CBC - Abnormal; Notable for the following components:      Result Value   WBC 27.2 (*)    RBC 2.56 (*)    Hemoglobin 7.7 (*)    HCT 23.4 (*)    RDW 21.5 (*)    nRBC 1.1 (*)    All other components within normal limits  COMPREHENSIVE METABOLIC PANEL - Abnormal; Notable for the following components:   CO2 19 (*)    Glucose, Bld 330 (*)    BUN 65 (*)    Creatinine, Ser 2.55 (*)    Albumin 2.9 (*)    Alkaline Phosphatase 147 (*)    GFR calc non Af Amer 19 (*)    GFR calc Af Amer 22 (*)    All other components within normal limits  LACTIC ACID, PLASMA - Abnormal; Notable for the following components:   Lactic Acid, Venous 2.3 (*)    All other components within normal limits  CULTURE, BLOOD (SINGLE)    BRAIN NATRIURETIC PEPTIDE  POC SARS CORONAVIRUS 2 AG -  ED    DG Chest Port 1 View  Final Result      Medications  ceFEPIme (MAXIPIME) 2 g in sodium chloride 0.9 % 100 mL IVPB (has no administration in time range)  0.9 %  sodium chloride  infusion (has no administration in time range)     Procedures  /  Critical Care .Critical Care Performed by: Maudie Flakes, MD Authorized by: Maudie Flakes, MD   Critical care provider statement:    Critical care time (minutes):  32   Critical care was necessary to treat or prevent imminent or life-threatening deterioration of the following conditions:  Respiratory failure   Critical care was time spent personally by me on the following activities:  Discussions with consultants, evaluation of patient's response to treatment, examination of patient, ordering and performing treatments and interventions, ordering and review of laboratory studies, ordering and review of radiographic studies, pulse oximetry, re-evaluation of patient's condition, obtaining history from patient or surrogate and review of old charts    ED Course and Medical Decision Making  I have reviewed the triage vital signs, the nursing notes, and pertinent available records from the EMR.  Pertinent labs & imaging results that were available during my care of the patient were reviewed by me and considered in my medical decision making (see below for details).     Concern for worsening pneumonia versus CHF to explain patient's hypoxic respiratory failure, hemodynamically stable, requiring high flow nasal cannula, will need admission to stepdown.  7:20 PM update: X-ray with signs of worsening pneumonia, will admit to hospital service.  Patient provided with cefepime.  Barth Kirks. Sedonia Small, [place] [email or web address]  Final Clinical Impressions(s) / ED Diagnoses     ICD-10-CM   1. Acute respiratory failure with hypoxia (HCC)   J96.01     ED Discharge Orders    None       Discharge Instructions Discussed with and Provided to Patient:   Discharge Instructions   None       Maudie Flakes, MD 09/20/19 1921

## 2019-09-20 NOTE — ED Notes (Signed)
POC covid Neg(-) MD bero have been made aware

## 2019-09-20 NOTE — ED Notes (Signed)
Dr. Sedonia Small notified RT because patients saturation was dropping when he placed patient from a non-re breather to 6L via [place]. Patients oxygen saturation dropped the 80's. RT has placed patient on humidifed high flow oxygen at 12L.

## 2019-09-20 NOTE — ED Notes (Signed)
Patient requesting to not start the lasix at this time. Reporting she would like to wait until morning so she can sleep tonight. Hospitalist made aware.

## 2019-09-20 NOTE — Progress Notes (Signed)
Results for Patricia Mata, Patricia Mata (MRN [id number]) as of 09/20/2019 23:17  Ref. Range 09/20/2019 21:06  pH, Arterial Latest Ref Range: 7.350 - 7.450  7.408  pCO2 arterial Latest Ref Range: 32.0 - 48.0 mmHg 34.6  pO2, Arterial Latest Ref Range: 83.0 - 108.0 mmHg 71.9 (L)  Acid-base deficit Latest Ref Range: 0.0 - 2.0 mmol/L 2.5 (H)  Bicarbonate Latest Ref Range: 20.0 - 28.0 mmol/L 21.3  O2 Saturation Latest Units: % 93.3  Patient temperature Unknown 98.6  Allens test (pass/fail) Latest Ref Range: PASS  PASS  ABG Drawn on HFNC([place]) @ 15 lpm, RR-20

## 2019-09-20 NOTE — Telephone Encounter (Signed)
Transition Care Management Follow-up Telephone Call  Date of discharge and from where 09/17/19 [place]  How have you been since you were released from the hospital? She is doing ok she is coughing a lot  Any questions or concerns? She wants some medicine to help her cough  Items Reviewed:  Did the pt receive and understand the discharge instructions provided? yes  Medications obtained and verified? yes  Any new allergies since your discharge? no  Dietary orders reviewed? no  Do you have support at home? yes  Other (ie: DME, Home Health, etc)  Functional Questionnaire: (I = Independent and D = Dependent) ADL's: I  Bathing/Dressing- I   Meal Prep- I  Eating- I  Maintaining continence- i  Transferring/Ambulation- i  Managing Meds- i   Follow up appointments reviewed:    PCP Hospital f/u appt confirmed? Yes Scheduled to see [name] on 09/20/19 @ [place] [facility] f/u appt confirmed?no  Are transportation arrangements needed?no  If their condition worsens, is the pt aware to call  their PCP or go to the ED? Yes   Was the patient provided with contact information for the PCP's office or ED? yes  Was the pt encouraged to call back with questions or concerns? Yes

## 2019-09-20 NOTE — H&P (Signed)
Patricia Mata RCB:[id number] DOB: 1956-01-19 DOA: 09/20/2019    PCP: [name], FNP   Outpatient Specialists:   NEphrology:   Dr. Narda Amber kidney  Oncology   Dr.Shadad   Patient arrived to ER on 09/20/19 at 1718  Patient coming from: home Lives  With family    Chief Complaint:   Chief Complaint  Patient presents with  . Shortness of Breath    HPI: Patricia Mata is a 64 y.o. female with medical history significant of history of hypertension, diabetes MDS, pulmonary HTN anemia of chronic kidney disease, CKD, chronic atrial fibrillation, gout, hypothyroidism, history of Covid more than 6 months ago September 2020  Presented with worsening shortness of breath not better since discharge. Patient recently was admitted for community-acquired pneumonia treated with Rocephin and doxycycline IV was discharged home on 5 March with prescriptions for cefdinir Reports her blood sugar has been staying up to 200s since her discharge.  She does not have home health or home oxygen.  She has been having very hard time breathing given his exertion.  But seems like it is getting worse at night.  Since patient was not feeling better she presented to urgent care she was noted to have a hard time even having a conversation urgent care and was advised to come to emergency department Noted to be satting 82% on 4 L on arrival up to 94% on nonrebreather EMS reported abnormal EKG  Patient has not had any fevers or chills no chest pain no leg swelling. Infectious risk factors:  Reports  shortness of breath, dry cough,     in house  PCR testing  Pending  Lab Results  Component Value Date   [place] 09/14/2019   SARSCOV2NAA NOT DETECTED 04/20/2019   [place] Not Detected 04/02/2019   SARSCOV2NAA POSITIVE (A) 03/18/2019     Regarding pertinent Chronic problems:    Hyperlipidemia - on statins Lipitor,   HTN on doxazosin, Lasix, hydralazine.   chronic CHF diastolic   - last echo august  2020 EF 60-65% calcified  aortic valve with severe AS (mean gradient 46 mmHg); moderate TR with  severe pulmonary hypertension (TR velocity 3.9 m/s).   Pulmonary hypertension on sildenafil Followed by cardiology right heart cath done in October 2020 showed 1. Mild PAH in setting of high-output state 2. Normal PCWP with significant v-waves Improvement of pulmonary pressures   DM 2 -  Lab Results  Component Value Date   HGBA1C 6.0 (H) 06/22/2019   on insulin Lantus, Januvia    Hypothyroidism:  Lab Results  Component Value Date   TSH 6.810 (H) 06/22/2019   on synthroid   obesity-   BMI Readings from Last 1 Encounters:  09/20/19 30.90 kg/m   History of myelodysplasia followed by oncology as an outpatient hemoglobin at baseline around 8.5   A. Fib -  - CHA2DS2 vas score >3 :     Not on anticoagulation secondary to recurrent bleeding epistaxis chronic anemia secondary to MDS           - Rhythm control:  Amiodarone,     CKD stage III - baseline Cr 1.8-2.4 followed by nephrology Lab Results  Component Value Date   CREATININE 2.55 (H) 09/20/2019   CREATININE 1.80 (H) 09/17/2019   CREATININE 1.58 (H) 09/16/2019    While in ER: History of worsening hypoxia chest x-ray showing signs of worsening pneumonia   The following Work up has been ordered so far:  Orders Placed This Encounter  Procedures  . Critical Care  . Culture, blood (single) w Reflex to ID Panel  . DG Chest Port 1 View  . CBC  . Comprehensive metabolic panel  . Lactic acid, plasma  . Brain natriuretic peptide  . Cardiac monitoring  . Consult to hospitalist  ALL PATIENTS BEING ADMITTED/HAVING PROCEDURES NEED COVID-19 SCREENING  . Airborne and Contact precautions  . POC SARS Coronavirus 2 Ag-ED - Nasal Swab (BD Veritor Kit)  . Insert peripheral IV    Following Medications were ordered in ER: Medications  ceFEPIme (MAXIPIME) 2 g in sodium chloride 0.9 % 100 mL IVPB (2 g Intravenous New Bag/Given 09/20/19  1926)  0.9 %  sodium chloride infusion (500 mLs Intravenous New Bag/Given 09/20/19 1925)        Consult Orders  (From admission, onward)         Start     Ordered   09/20/19 1923  Consult to hospitalist  ALL PATIENTS BEING ADMITTED/HAVING PROCEDURES NEED COVID-19 SCREENING  Once    Comments: ALL PATIENTS BEING ADMITTED/HAVING PROCEDURES NEED COVID-19 SCREENING  Provider:  (Not yet assigned)  Question Answer Comment  Place call to: [facility]   Reason for Consult Admit      09/20/19 1922           Significant initial  Findings: Abnormal Labs Reviewed  CBC - Abnormal; Notable for the following components:      Result Value   WBC 27.2 (*)    RBC 2.56 (*)    Hemoglobin 7.7 (*)    HCT 23.4 (*)    RDW 21.5 (*)    nRBC 1.1 (*)    All other components within normal limits  COMPREHENSIVE METABOLIC PANEL - Abnormal; Notable for the following components:   CO2 19 (*)    Glucose, Bld 330 (*)    BUN 65 (*)    Creatinine, Ser 2.55 (*)    Albumin 2.9 (*)    Alkaline Phosphatase 147 (*)    GFR calc non Af Amer 19 (*)    GFR calc Af Amer 22 (*)    All other components within normal limits  LACTIC ACID, PLASMA - Abnormal; Notable for the following components:   Lactic Acid, Venous 2.3 (*)    All other components within normal limits  BRAIN NATRIURETIC PEPTIDE - Abnormal; Notable for the following components:   B Natriuretic Peptide 548.2 (*)    All other components within normal limits     Otherwise labs showing:    Recent Labs  Lab 09/14/19 0736 09/14/19 1551 09/16/19 0328 09/17/19 0501 09/20/19 1823  NA 134* 136 135 137 138  K 3.8 4.0 3.7 3.7 4.5  CO2 20* 23 20* 21* 19*  GLUCOSE 248* 204* 196* 173* 330*  BUN 49* 48* 47* 45* 65*  CREATININE 1.82* 1.89* 1.58* 1.80* 2.55*  CALCIUM 8.9 8.9 8.9 9.0 8.9  MG  --   --  1.7 1.7  --     Cr   Up from baseline see below Lab Results  Component Value Date   CREATININE 2.55 (H) 09/20/2019   CREATININE 1.80 (H)  09/17/2019   CREATININE 1.58 (H) 09/16/2019    Recent Labs  Lab 09/14/19 0736 09/20/19 1823  AST 42* 32  ALT 30 24  ALKPHOS 170* 147*  BILITOT 1.4* 0.9  PROT 7.4 7.1  ALBUMIN 3.3* 2.9*   Lab Results  Component Value Date   CALCIUM 8.9 09/20/2019   PHOS 3.8 03/22/2019  WBC      Component Value Date/Time   WBC 27.2 (H) 09/20/2019 1823   ANC    Component Value Date/Time   NEUTROABS 23.7 (H) 09/14/2019 0736   NEUTROABS 2.0 06/10/2017 1028   NEUTROABS 2.4 05/26/2017 0949   ALC No components found for: LYMPHAB    Plt: Lab Results  Component Value Date   PLT 285 09/20/2019     Lactic Acid, Venous    Component Value Date/Time   LATICACIDVEN 2.3 (HH) 09/20/2019 1823     Procalcitonin  2.18   COVID-19 Labs  Recent Labs    09/20/19 1823 09/20/19 1826  DDIMER 2.53*  --   FERRITIN  --  1,870*  LDH 250*  --   CRP  --  19.8*    Lab Results  Component Value Date   SARSCOV2NAA NEGATIVE 09/14/2019   SARSCOV2NAA NOT DETECTED 04/20/2019   [place] Not Detected 04/02/2019   SARSCOV2NAA POSITIVE (A) 03/18/2019      HG/HCT    Down   from baseline see below    Component Value Date/Time   HGB 7.7 (L) 09/20/2019 1823   HGB 7.4 (L) 09/09/2019 0900   HGB 6.3 (LL) 06/10/2017 1028   HGB 5.7 (LL) 05/26/2017 0949   HCT 23.4 (L) 09/20/2019 1823   HCT 19.0 (L) 06/10/2017 1028   HCT 17.0 (L) 05/26/2017 0949     Troponin 62     ECG: Ordered Personally reviewed by me showing: HR : 84 Rhythm:  NSR,   nonspecific changes, minimal ST depressions lateral ST elevation QTC 440   BNP (last 3 results) Recent Labs    09/14/19 0736 09/20/19 1823  BNP 366.0* 548.2*    ProBNP (last 3 results) No results for input(s): PROBNP in the last 8760 hours.  DM  labs:  HbA1C: Recent Labs    03/20/19 0406 04/19/19 1639 06/22/19 1216  HGBA1C 6.7* 6.5* 6.0*       CBG (last 3)  No results for input(s): GLUCAP in the last 72 hours.     UA    ordered       Ordered    CXR - worseing bilateral opacities     ED Triage Vitals  Enc Vitals Group     BP 09/20/19 1745 (!) 138/55     Pulse Rate 09/20/19 1745 83     Resp 09/20/19 1745 20     Temp 09/20/19 1745 98.6 F (37 C)     Temp Source 09/20/19 1745 Oral     SpO2 09/20/19 1731 94 %     Weight 09/20/19 1741 180 lb (81.6 kg)     Height 09/20/19 1741 '5\' 4"'  (1.626 m)     Head Circumference --      Peak Flow --      Pain Score 09/20/19 1739 6     Pain Loc --      Pain Edu? --      Excl. in [place]? --   TMAX(24)@       Latest  Blood pressure (!) 140/49, pulse 80, temperature 98.6 F (37 C), temperature source Oral, resp. rate (!) 27, height '5\' 4"'  (1.626 m), weight 81.6 kg, SpO2 91 %.    Hospitalist was called for admission for acute respiratory failure today with severe hypoxia   Review of Systems:    Pertinent positives include:  Fatigue,  shortness of breath at rest.   dyspnea on exertion, productive cough occasional wheezing. Constitutional:  No weight loss, night sweats,  Fevers, chills,  weight loss  HEENT:  No headaches, Difficulty swallowing,Tooth/dental problems,Sore throat,  No sneezing, itching, ear ache, nasal congestion, post nasal drip,  Cardio-vascular:  No chest pain, Orthopnea, PND, anasarca, dizziness, palpitations.no Bilateral lower extremity swelling  GI:  No heartburn, indigestion, abdominal pain, nausea, vomiting, diarrhea, change in bowel habits, loss of appetite, melena, blood in stool, hematemesis Resp:  no No excess mucus, no , No non-productive cough, No coughing up of blood.No change in color of mucus.  Skin:  no rash or lesions. No jaundice GU:  no dysuria, change in color of urine, no urgency or frequency. No straining to urinate.  No flank pain.  Musculoskeletal:  No joint pain or no joint swelling. No decreased range of motion. No back pain.  Psych:  No change in mood or affect. No depression or anxiety. No memory loss.  Neuro: no localizing  neurological complaints, no tingling, no weakness, no double vision, no gait abnormality, no slurred speech, no confusion  All systems reviewed and apart from [place] all are negative  Past Medical History:   Past Medical History:  Diagnosis Date  . Acute kidney injury (nontraumatic) ([place])   . Acute renal failure (ARF) ([employer]) 06/08/2018  . Cancer [facility]) 2008   Colon   . Diabetes mellitus without complication ([place])   . Gout 09/08/2018  . Hypertension   . Macrocytic anemia      Past Surgical History:  Procedure Laterality Date  . COLON SURGERY    . DEBRIDMENT OF DECUBITUS ULCER N/A 06/12/2018   Procedure: DEBRIDMENT OF DECUBITUS ULCER;  Surgeon: Wallace Going, DO;  Location: [facility];  Service: Plastics;  Laterality: N/A;  . IR FLUORO GUIDE PORT INSERTION RIGHT  04/02/2017  . IR REMOVAL TUN ACCESS W/ PORT W/O FL MOD SED  03/16/2019  . IR US GUIDE VASC ACCESS RIGHT  04/02/2017  . RIGHT HEART CATH N/A 04/23/2019   Procedure: RIGHT HEART CATH;  Surgeon: Jolaine Artist, MD;  Location: [place] [facility];  Service: Cardiovascular;  Laterality: N/A;  . RIGHT/LEFT HEART CATH AND CORONARY ANGIOGRAPHY N/A 05/21/2018   Procedure: RIGHT/LEFT HEART CATH AND CORONARY ANGIOGRAPHY;  Surgeon: Nelva Bush, MD;  Location: [place] [facility];  Service: Cardiovascular;  Laterality: N/A;    Social History:  Ambulatory  Independently      reports that she quit smoking about 18 years ago. Her smoking use included cigarettes. She has a 5.00 pack-year smoking history. She has quit using smokeless tobacco. She reports current alcohol use of about 2.0 standard drinks of alcohol per week. She reports that she does not use drugs.   Family History:   Family History  Problem Relation Age of Onset  . Hypertension Mother   . Diabetes Mother   . Cervical cancer Mother   . Heart attack Father   . Hypertension Sister   . Hypertension Brother   . Hypertension Sister   . Hypertension Sister   .  Prostate cancer Brother   . HIV/AIDS Brother     Allergies: Allergies  Allergen Reactions  . Ancef [Cefazolin] Itching    Severe itching- after procedure, ancef was the antibiotic.-04/02/17 Tolerates penicillins  . Sulfa Antibiotics Rash and Other (See Comments)    Blisters, also     Prior to Admission medications   Medication Sig Start Date End Date Taking? Authorizing Provider  allopurinol (ZYLOPRIM) 100 MG tablet Take 100 mg by mouth daily. 05/07/19  Yes [provider]  amiodarone (PACERONE) 200  MG tablet TAKE 1 TABLET BY MOUTH EVERY DAY Patient taking differently: Take 200 mg by mouth daily.  08/02/19  Yes Bensimhon, Shaune Pascal, MD  amLODipine (NORVASC) 10 MG tablet Take 1 tablet (10 mg total) by mouth daily. 02/15/19  Yes Bensimhon, Shaune Pascal, MD  atorvastatin (LIPITOR) 40 MG tablet TAKE 1 TABLET BY MOUTH EVERY DAY Patient taking differently: Take 40 mg by mouth daily at 6 PM.  08/21/19  Yes [name], FNP  cefdinir (OMNICEF) 300 MG capsule Take 1 capsule (300 mg total) by mouth daily for 4 days. 09/17/19 09/21/19 Yes Pokhrel, Laxman, MD  colchicine 0.6 MG tablet TAKE 1 TABLET BY MOUTH TWICE A DAY AS NEEDED FOR GOUT Patient taking differently: Take 0.6 mg by mouth daily.  09/01/19  Yes [name], FNP  doxazosin (CARDURA) 1 MG tablet Take 1 tablet (1 mg total) by mouth at bedtime. 05/21/19  Yes Bensimhon, Shaune Pascal, MD  doxycycline (VIBRA-TABS) 100 MG tablet Take 1 tablet (100 mg total) by mouth 2 (two) times daily for 4 days. 09/17/19 09/21/19 Yes Pokhrel, Laxman, MD  furosemide (LASIX) 40 MG tablet TAKE 1 TABLET BY MOUTH EVERY DAY Patient taking differently: Take 40 mg by mouth daily.  05/12/19  Yes [name], FNP  hydrALAZINE (APRESOLINE) 100 MG tablet Take 1 tablet (100 mg total) by mouth 3 (three) times daily. 11/20/18  Yes Lorretta Harp, MD  insulin glargine (LANTUS) 100 unit/mL SOPN Inject 0.15 mLs (15 Units total) into the skin daily. Patient taking differently: Inject  5 Units into the skin 2 (two) times daily as needed (blood sugar over 150).  03/23/19  Yes Patrecia Pour, MD  JANUVIA 50 MG tablet TAKE 1 TABLET BY MOUTH EVERY DAY Patient taking differently: Take 50 mg by mouth daily.  07/12/19  Yes [name], FNP  latanoprost (XALATAN) 0.005 % ophthalmic solution Place 1 drop into both eyes at bedtime.    Yes [provider]  levothyroxine (SYNTHROID) 88 MCG tablet Take 1 tablet (88 mcg total) by mouth daily. 07/15/19 07/14/20 Yes [name], FNP  Multiple Vitamins-Minerals (CENTRUM SILVER 50+WOMEN) TABS Take 1 tablet by mouth daily.   Yes [provider]  oxymetazoline (AFRIN) 0.05 % nasal spray Place 1 spray into both nostrils 2 (two) times daily as needed for congestion.   Yes [provider]  sildenafil (REVATIO) 20 MG tablet Take 1 tablet (20 mg total) by mouth 3 (three) times daily. 02/25/19  Yes Clegg, Amy D, NP  blood glucose meter kit and supplies Dispense based on patient and insurance preference. Use up to four times daily as directed. (FOR ICD-10 E10.9, E11.9). 11/04/18   [name], FNP  Blood Glucose Monitoring Suppl [facility] Karsten Fells) w/Device KIT Check blood sugars twice daily E11.9 07/15/19   [name], FNP  glucose blood test strip Use as instructed 07/22/19   [name], FNP  guaiFENesin-dextromethorphan (ROBITUSSIN DM) 100-10 MG/5ML syrup Take 5 mLs by mouth every 4 (four) hours as needed for cough. 09/17/19   Pokhrel, Corrie Mckusick, MD  Insulin Pen Needle 29G X 5MM MISC Use as directed 06/16/17   Bonnielee Haff, MD  Lancets [facility] ULTRASOFT) lancets Use as instructed to check blood sugars twice daily E11.9 07/15/19   [name], FNP   Physical Exam: Blood pressure (!) 140/49, pulse 80, temperature 98.6 F (37 C), temperature source Oral, resp. rate (!) 27, height '5\' 4"'  (1.626 m), weight 81.6 kg, SpO2 91 %. 1. General:  in  Acute distress increased  work of breathing     Chronically ill /acutely ill  -appearing 2. Psychological: Alert and  Oriented 3. Head/ENT:   Moist   Mucous Membranes                          Head Non traumatic, neck supple                          Poor Dentition 4. SKIN:   decreased Skin turgor,  Skin clean Dry and intact no rash unable to assess back secondary to respiratory distress patient with history in the past of sacral ulcer.  Once more stable would need to be reassessed  5. Heart: Regular rate and rhythm systolic  Murmur, no Rub or gallop 6. Lungs:   no wheezes some crackles   7. Abdomen: Soft,  non-tender, Non distended bowel sounds present 8. Lower extremities: no clubbing, cyanosis, no edema 9. 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 MSK: Normal range of motion   All other LABS:     Recent Labs  Lab 09/14/19 0736 09/15/19 0334 09/16/19 0328 09/17/19 0501 09/20/19 1823  WBC 27.5* 17.4* 14.7* 14.6* 27.2*  NEUTROABS 23.7*  --   --   --   --   HGB 8.8* 8.4* 8.3* 8.3* 7.7*  HCT 26.1* 25.8* 24.6* 25.6* 23.4*  MCV 90.6 92.1 89.5 91.8 91.4  PLT 212 208 206 238 285     Recent Labs  Lab 09/14/19 0736 09/14/19 1551 09/16/19 0328 09/17/19 0501 09/20/19 1823  NA 134* 136 135 137 138  K 3.8 4.0 3.7 3.7 4.5  CL 101 104 102 105 106  CO2 20* 23 20* 21* 19*  GLUCOSE 248* 204* 196* 173* 330*  BUN 49* 48* 47* 45* 65*  CREATININE 1.82* 1.89* 1.58* 1.80* 2.55*  CALCIUM 8.9 8.9 8.9 9.0 8.9  MG  --   --  1.7 1.7  --      Recent Labs  Lab 09/14/19 0736 09/20/19 1823  AST 42* 32  ALT 30 24  ALKPHOS 170* 147*  BILITOT 1.4* 0.9  PROT 7.4 7.1  ALBUMIN 3.3* 2.9*      Cultures:    Component Value Date/Time   SDES  03/18/2019 1337    URINE, RANDOM Performed at [facility], [place] [address] [place], [place] [address]    SPECREQUEST  03/18/2019 1337    NONE Performed at [facility], [place] [address]  [place], [place] [address]    CULT  03/18/2019 1337    Multiple bacterial morphotypes present, none predominant. Suggest appropriate recollection if clinically indicated.   REPTSTATUS 03/19/2019 FINAL 03/18/2019 1337     Radiological Exams on Admission: DG Chest Port 1 View  Result Date: 09/20/2019 CLINICAL DATA:  Shortness of breath EXAM: PORTABLE CHEST 1 VIEW COMPARISON:  September 14, 2019 FINDINGS: Diffuse bilateral airspace opacities are again noted. The heart size is stable but enlarged. Aortic calcifications are noted. There are probable bilateral pleural effusions. There is no acute osseous abnormality. IMPRESSION: Interval worsening of bilateral airspace opacities, otherwise no significant interval change. Electronically Signed   By: Constance Holster M.D.   On: 09/20/2019 19:00    Chart has been reviewed    Assessment/Plan  64 y.o. female with medical history significant of history of hypertension, diabetes MDS, pulmonary HTN anemia of chronic  kidney disease, CKD, chronic atrial fibrillation, gout, hypothyroidism, history of Covid more than 6 months ago September 2020 Admitted for acute respiratory failure multifactorial Present on Admission: . Acute respiratory failure with hypoxia (HCC) -very complex picture likely multifactorial History of recent pneumonia with persistent pulmonary infiltrates now worsening hypoxia and worsening leukocytosis. Readmitted to stepdown on high flow oxygen requiring up to 15 L high flow Obtain ABG Discussed with PCCM Given elevated D-dimer and propensity for blood clots start heparin but no bolus As patient has propensity for bleeding. Attempt gentle diuresis and see if that would improve her status. Patient at high risk of developing respiratory failure requiring intubation PCCM aware patient admitted to stepdown Could attempt a trial of CPAP if Covid is negative   Sepsis -   -Patient meets sepsis criteria with  Leukocytosis respiratory failure  underlying infectious process   Initial lactic acid Lactic Acid, Venous    Component Value Date/Time   LATICACIDVEN 2.3 ([place]) 09/20/2019 1823   Source most likely: pneumonia,    -We will rehydrate, treat with IV antibiotics, follow lactic acid - Await results of blood and urine culture and adjust antibiotics as needed - Obtain MRSA serologies  - Obtain respiratory panel  Covid pending   . Anemia due to chronic kidney disease -transfuse as needed for hemoglobin below 7 patient has chronic anemia in the setting of MDS  . MDS (myelodysplastic syndrome) (HCC) -chronic followed by oncology   . Essential hypertension -resume home medications as blood pressure allows   . Hyperlipidemia -chronic stable continue home medication     . Hypothyroidism- - Check TSH continue home medications at current dose  . HCAP (healthcare-associated pneumonia) -failure of outpatient treatment with progression of increased white blood cell count and respiratory failure.  Will broaden antibiotic spectrum await results of sputum cultures   . Pulmonary hypertension (HCC)-repeat echogram could be contributing to hypoxic failure.  Elevated troponin in setting of hypoxia and increased work of breathing.  Continue to follow obtain echogram  . CKD (chronic kidney disease), stage III worsening renal function. Will need to monitor carefully in the setting of gentle diuresis  Other plan as per orders.  DM2 -  - Order Sensitive  SSI   - continue home insulin regimen    -  check TSH and HgA1C  - Hold by mouth medications     DVT prophylaxis:  heparin    Code Status:  FULL CODE  as per patient  I had personally discussed CODE STATUS with patient    Family Communication:   Family not at  Bedside    Disposition Plan: Discharge to home when respiratory failure resolves given severity of respiratory failure very guarded prognosis patient will likely need extensive support and prolonged hospital stay                                             Would benefit from PT/OT eval prior to DC  Ordered                     Nutrition    consulted                                      Consults called: PCCM    Admission status:  ED  Disposition    ED Disposition Condition Comment   Admit  The patient appears reasonably stabilized for admission considering the current resources, flow, and capabilities available in the ED at this time, and I doubt any other [facility] requiring further screening and/or treatment in the ED prior to admission is  present.        inpatient     I Expect 2 midnight stay secondary to severity of patient's current illness need for inpatient interventions justified by the following:  hemodynamic instability despite optimal treatment (tachycardia  tachypnea   hypoxia,  )   Severe lab/radiological/exam abnormalities including:  PNeunomia, severe respiratory needing high oxygen   and extensive comorbidities including:  DM2    CHF   CKD   malignancy,   That are currently affecting medical management.   I expect  patient to be hospitalized for 2 midnights requiring inpatient medical care.  Patient is at high risk for adverse outcome (such as loss of life or disability) if not treated.  Indication for inpatient stay as follows:    Hemodynamic instability despite maximal medical therapy,    inability to maintain oral hydration    New or worsening hypoxia  Need for IV antibiotics, IV diuretics   IV anticoagulation    Level of care    SDU tele indefinitely please discontinue once patient no longer qualifies   Precautions: admitted as  PUI   Airborne and Contact precautions  If Covid PCR is negative  - please DC precautions   PPE: Used by the provider:   P100  eye Goggles,  Gloves   gown    Critical    Patient is critically ill due to  hemodynamic instability   respiratory failure  They are at high risk for life/limb threatening clinical deterioration requiring frequent  reassessment and modifications of care.  Services provided include examination of the patient, review of relevant ancillary tests, prescription of lifesaving therapies, review of medications and prophylactic therapy.  Total critical care time excluding separately billable procedures: 65  Minutes.   Toy Baker 09/20/2019, 10:34 PM    [facility]     after 2 AM please page floor coverage PA If 7AM-7PM, please contact the day team taking care of the patient using [email or web address]   Patient was evaluated in the context of the global COVID-19 pandemic, which necessitated consideration that the patient might be at risk for infection with the SARS-CoV-2 virus that causes COVID-19. Institutional protocols and algorithms that pertain to the evaluation of patients at risk for COVID-19 are in a state of rapid change based on information released by regulatory bodies including the CDC and federal and state organizations. These policies and algorithms were followed during the patient's care.

## 2019-09-20 NOTE — Consult Note (Addendum)
NAME:  Patricia Mata, MRN:  [id number], DOB:  [birth year], LOS: 0 ADMISSION DATE:  09/20/2019, CONSULTATION DATE:  09/20/19 REFERRING MD:  Roel Cluck, CHIEF COMPLAINT:  Pneumonia  Brief History   This is a 64 yo with history of IDDM, HTN, CKD, Pulm HTN, gout, colon cancer who presents for recurrence of pna like symptoms after treatment for CAP for 5 days on previous admisison from 3/02-3/05.   History of present illness   This is a 64 yo with history as noted above with history of IDDM, HTN, CKD, Pulm HTN, gout, colon cancer who presents for recurrence of pna like symptoms after treatment for CAP for 5 days on previous admisison from 3/02-3/05. Today presenting with recurrence of pna like symptoms. Increased cough that is green in color. Patient with increased WOB as well. Felt it was getting harder to breath. No increase in weight. No lower extremity edema. Has been taking lasix and sildenafil daily. No fevers but is feeling worse. Smoked for about 15 pack years 20 years ago. No travels recently. Has not lived in [place] for more than 6 months. Has lived here in [place] whole life. 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 No tb risk factor including homelessness, IVDU or incarceration. Has a dog that lives with her. No birds.   Past Medical History  CAP Anemia in CKD Essential HNT HLD T2DM Hypoxia  Significant Hospital Events   Patient being admitted to stepdown  Consults:  PCCM  Procedures:  NA Significant Diagnostic Tests:  CT chest with contrast.  Echocardiogram   Micro Data:  Sputum culture  Antimicrobials:  Vancomycin Cefepime  Interim history/subjective:  Patient being admitted for pna    Objective   Blood pressure (!) 158/72, pulse 78, temperature 98.6 F (37 C), temperature source Oral, resp. rate 17, height 5' 4" (1.626 m), weight 81.6 kg, SpO2 100 %.        Intake/Output Summary (Last 24 hours) at 09/20/2019 2320 Last data filed at 09/20/2019  2016 Gross per 24 hour  Intake 100.06 ml  Output --  Net 100.06 ml   Filed Weights   09/20/19 1741  Weight: 81.6 kg    Examination: General: Patient agreeable and in no increased in WOB HENT: Moist mucous membranes Lungs: Cravckles bibasilar. No wheezing. Minimal if any WOB. No inctercostal muscle use on 15 liters high flow at this time and doing well.  Cardiovascular: Holosystolic murmur noted. Regular rate and rhythm  Abdomen: Soft non tender and on distended Extremities: No gorss deformities. No pitting edema noted.  Neuro: Grossly intact GU: Defereed  Resolved Hospital Problem list   NA  Assessment & Plan:  This is a 64 yo with history as noted above who presents for increased WOB and sp[utum production in the setting of previously treated CP. Was to finish last oral abx tomorrow per patient.    HAP-Patient has failed CAP coverage ans was just hospitalized on 09/14/19. Given this would classify this as HAP.Did get treatment for atypical with azithromycin during recent hospitalization. Alternatively could be consistent with ILD with bibasilar distribution (UIP). At which point if consistent with this on CT would consider Steroids. Of note patiuetn was admitted for COVID on 03/18/19 had normal pft's on 03/14/19 with normal TLC and FEV/FVC after that was normal but did have decreased DLCO.  -Agree with vanc and cefepime -Sputum cx's to be sent tin the ED -Would recommend CT chest without contrast. If shows abscess or complication  form anerobic infection would cover with metro vs zosyn (pending improvement of renal function.) Would also consider swallow eval at that time. (patient denies symptoms that would alert to aspiration) - Agree with legionella and strep urine -Agree with airway clearance -Agree with heated high flow may need salter/optiflow device.    Pulm HTN-Appears to be stable . Most recent right heart cath on 04/23/19 Noted PA pressure of 57/12(32) capillary wedge was 14  which was much improved from right heart cath in 2019 which noted sever PH with mean of 62 (105/40) cappillary wedge of 25. -Agree with echo with bubble study for now -continue home meds of sildenafil and continue home diuretic. Not overtly overloaded at this time BNP only 500 previously in the 1000's     Best practice:  Diet: Npo for now Pain/Anxiety/Delirium protocol (if indicated): NA VAP protocol (if indicated): NA DVT prophylaxis: Per primary GI prophylaxis: Per primary Mobility: TBD Code Status: Full code would want intubation and CPR  if should be needed Family Communication: Talked to aptient. She will discuss with husband Disposition: Steep down unit  Labs   CBC: Recent Labs  Lab 09/14/19 0736 09/15/19 0334 09/16/19 0328 09/17/19 0501 09/20/19 1823  WBC 27.5* 17.4* 14.7* 14.6* 27.2*  NEUTROABS 23.7*  --   --   --   --   HGB 8.8* 8.4* 8.3* 8.3* 7.7*  HCT 26.1* 25.8* 24.6* 25.6* 23.4*  MCV 90.6 92.1 89.5 91.8 91.4  PLT 212 208 206 238 704    Basic Metabolic Panel: Recent Labs  Lab 09/14/19 0736 09/14/19 1551 09/16/19 0328 09/17/19 0501 09/20/19 1823  NA 134* 136 135 137 138  K 3.8 4.0 3.7 3.7 4.5  CL 101 104 102 105 106  CO2 20* 23 20* 21* 19*  GLUCOSE 248* 204* 196* 173* 330*  BUN 49* 48* 47* 45* 65*  CREATININE 1.82* 1.89* 1.58* 1.80* 2.55*  CALCIUM 8.9 8.9 8.9 9.0 8.9  MG  --   --  1.7 1.7 1.6*   GFR: Estimated Creatinine Clearance: 23 mL/min (A) (by C-G formula based on SCr of 2.55 mg/dL (H)). Recent Labs  Lab 09/15/19 0334 09/16/19 0328 09/17/19 0501 09/20/19 1823  PROCALCITON  --   --   --  2.18  WBC 17.4* 14.7* 14.6* 27.2*  LATICACIDVEN  --   --   --  2.3*    Liver Function Tests: Recent Labs  Lab 09/14/19 0736 09/20/19 1823  AST 42* 32  ALT 30 24  ALKPHOS 170* 147*  BILITOT 1.4* 0.9  PROT 7.4 7.1  ALBUMIN 3.3* 2.9*   No results for input(s): LIPASE, AMYLASE in the last 168 hours. No results for input(s): AMMONIA in the last  168 hours.  ABG    Component Value Date/Time   PHART 7.408 09/20/2019 2106   PCO2ART 34.6 09/20/2019 2106   PO2ART 71.9 (L) 09/20/2019 2106   HCO3 21.3 09/20/2019 2106   TCO2 24 04/23/2019 1237   ACIDBASEDEF 2.5 (H) 09/20/2019 2106   O2SAT 93.3 09/20/2019 2106     Coagulation Profile: No results for input(s): INR, PROTIME in the last 168 hours.  Cardiac Enzymes: No results for input(s): CKTOTAL, CKMB, CKMBINDEX, TROPONINI in the last 168 hours.  HbA1C: Hgb A1c MFr Bld  Date/Time Value Ref Range Status  06/22/2019 12:16 PM 6.0 (H) 4.8 - 5.6 % Final    Comment:             Prediabetes: 5.7 - 6.4  Diabetes: >6.4          Glycemic control for adults with diabetes: <7.0   04/19/2019 04:39 PM 6.5 (H) 4.8 - 5.6 % Final    Comment:             Prediabetes: 5.7 - 6.4          Diabetes: >6.4          Glycemic control for adults with diabetes: <7.0     CBG: No results for input(s): GLUCAP in the last 168 hours.  Review of Systems:   14 point ROS performed and Negative unless otherwise stated in the HPI  Past Medical History  She,  has a past medical history of Acute kidney injury (nontraumatic) ([place]), Acute renal failure (ARF) ([place]) (06/08/2018), Cancer ([place]) (2008), Diabetes mellitus without complication ([place]), Gout (09/08/2018), Hypertension, and Macrocytic anemia.   Surgical History    Past Surgical History:  Procedure Laterality Date  . COLON SURGERY    . DEBRIDMENT OF DECUBITUS ULCER N/A 06/12/2018   Procedure: DEBRIDMENT OF DECUBITUS ULCER;  Surgeon: Wallace Going, DO;  Location: [facility];  Service: Plastics;  Laterality: N/A;  . IR FLUORO GUIDE PORT INSERTION RIGHT  04/02/2017  . IR REMOVAL TUN ACCESS W/ PORT W/O FL MOD SED  03/16/2019  . IR US GUIDE VASC ACCESS RIGHT  04/02/2017  . RIGHT HEART CATH N/A 04/23/2019   Procedure: RIGHT HEART CATH;  Surgeon: Jolaine Artist, MD;  Location: [place] [facility];  Service: Cardiovascular;  Laterality: N/A;  .  RIGHT/LEFT HEART CATH AND CORONARY ANGIOGRAPHY N/A 05/21/2018   Procedure: RIGHT/LEFT HEART CATH AND CORONARY ANGIOGRAPHY;  Surgeon: Nelva Bush, MD;  Location: [place] [facility];  Service: Cardiovascular;  Laterality: N/A;     Social History   reports that she quit smoking about 18 years ago. Her smoking use included cigarettes. She has a 5.00 pack-year smoking history. She has quit using smokeless tobacco. She reports current alcohol use of about 2.0 standard drinks of alcohol per week. She reports that she does not use drugs.   Family History   Her family history includes Cervical cancer in her mother; Diabetes in her mother; HIV/AIDS in her brother; Heart attack in her father; Hypertension in her brother, mother, sister, sister, and sister; Prostate cancer in her brother.   Allergies Allergies  Allergen Reactions  . Ancef [Cefazolin] Itching    Severe itching- after procedure, ancef was the antibiotic.-04/02/17 Tolerates penicillins  . Sulfa Antibiotics Rash and Other (See Comments)    Blisters, also     Home Medications  Prior to Admission medications   Medication Sig Start Date End Date Taking? Authorizing Provider  allopurinol (ZYLOPRIM) 100 MG tablet Take 100 mg by mouth daily. 05/07/19  Yes [provider]  amiodarone (PACERONE) 200 MG tablet TAKE 1 TABLET BY MOUTH EVERY DAY Patient taking differently: Take 200 mg by mouth daily.  08/02/19  Yes Bensimhon, Shaune Pascal, MD  amLODipine (NORVASC) 10 MG tablet Take 1 tablet (10 mg total) by mouth daily. 02/15/19  Yes Bensimhon, Shaune Pascal, MD  atorvastatin (LIPITOR) 40 MG tablet TAKE 1 TABLET BY MOUTH EVERY DAY Patient taking differently: Take 40 mg by mouth daily at 6 PM.  08/21/19  Yes [name], FNP  cefdinir (OMNICEF) 300 MG capsule Take 1 capsule (300 mg total) by mouth daily for 4 days. 09/17/19 09/21/19 Yes Pokhrel, Laxman, MD  colchicine 0.6 MG tablet TAKE 1 TABLET BY MOUTH TWICE A DAY  AS NEEDED FOR GOUT Patient taking  differently: Take 0.6 mg by mouth daily.  09/01/19  Yes [name], FNP  doxazosin (CARDURA) 1 MG tablet Take 1 tablet (1 mg total) by mouth at bedtime. 05/21/19  Yes Bensimhon, Shaune Pascal, MD  doxycycline (VIBRA-TABS) 100 MG tablet Take 1 tablet (100 mg total) by mouth 2 (two) times daily for 4 days. 09/17/19 09/21/19 Yes Pokhrel, Laxman, MD  furosemide (LASIX) 40 MG tablet TAKE 1 TABLET BY MOUTH EVERY DAY Patient taking differently: Take 40 mg by mouth daily.  05/12/19  Yes [name], FNP  hydrALAZINE (APRESOLINE) 100 MG tablet Take 1 tablet (100 mg total) by mouth 3 (three) times daily. 11/20/18  Yes Lorretta Harp, MD  insulin glargine (LANTUS) 100 unit/mL SOPN Inject 0.15 mLs (15 Units total) into the skin daily. Patient taking differently: Inject 5 Units into the skin 2 (two) times daily as needed (blood sugar over 150).  03/23/19  Yes Patrecia Pour, MD  JANUVIA 50 MG tablet TAKE 1 TABLET BY MOUTH EVERY DAY Patient taking differently: Take 50 mg by mouth daily.  07/12/19  Yes [name], FNP  latanoprost (XALATAN) 0.005 % ophthalmic solution Place 1 drop into both eyes at bedtime.    Yes [provider]  levothyroxine (SYNTHROID) 88 MCG tablet Take 1 tablet (88 mcg total) by mouth daily. 07/15/19 07/14/20 Yes [name], FNP  Multiple Vitamins-Minerals (CENTRUM SILVER 50+WOMEN) TABS Take 1 tablet by mouth daily.   Yes [provider]  oxymetazoline (AFRIN) 0.05 % nasal spray Place 1 spray into both nostrils 2 (two) times daily as needed for congestion.   Yes [provider]  sildenafil (REVATIO) 20 MG tablet Take 1 tablet (20 mg total) by mouth 3 (three) times daily. 02/25/19  Yes Clegg, Amy D, NP  blood glucose meter kit and supplies Dispense based on patient and insurance preference. Use up to four times daily as directed. (FOR ICD-10 E10.9, E11.9). 11/04/18   [name], FNP  Blood Glucose Monitoring Suppl [facility]) w/Device KIT Check blood sugars  twice daily E11.9 07/15/19   [name], FNP  glucose blood test strip Use as instructed 07/22/19   [name], FNP  guaiFENesin-dextromethorphan (ROBITUSSIN DM) 100-10 MG/5ML syrup Take 5 mLs by mouth every 4 (four) hours as needed for cough. 09/17/19   Pokhrel, Corrie Mckusick, MD  Insulin Pen Needle 29G X 5MM MISC Use as directed 06/16/17   Bonnielee Haff, MD  Lancets [facility] ULTRASOFT) lancets Use as instructed to check blood sugars twice daily E11.9 07/15/19   [name], FNP     Critical care time: 60 min

## 2019-09-20 NOTE — ED Notes (Signed)
Pt assisted on BSC with minimal assist. Gait steady. Oxygen saturations from 96%-100%.

## 2019-09-20 NOTE — Progress Notes (Addendum)
Virtual Visit via Telephone   This visit type was conducted due to national recommendations for restrictions regarding the COVID-19 Pandemic (e.g. social distancing) in an effort to limit this patient's exposure and mitigate transmission in our community.  Due to her co-morbid illnesses, this patient is at least at moderate risk for complications without adequate follow up.  This format is felt to be most appropriate for this patient at this time.  All issues noted in this document were discussed and addressed.  A limited physical exam was performed with this format.    This visit type was conducted due to national recommendations for restrictions regarding the COVID-19 Pandemic (e.g. social distancing) in an effort to limit this patient's exposure and mitigate transmission in our community.  Patients identity confirmed using two different identifiers.  This format is felt to be most appropriate for this patient at this time.  All issues noted in this document were discussed and addressed.  No physical exam was performed (except for noted visual exam findings with Video Visits).    Date:  09/20/2019   ID:  Patricia Mata, DOB [birth year], MRN [id number]  Patient Location:  Home - spoke to Patricia Mata  Provider location:   Office    Chief Complaint:  Hospital follow up via telephone  History of Present Illness:    Patricia Mata is a 64 y.o. female who presents via video conferencing for a telehealth visit today.    The patient does not have symptoms concerning for COVID-19 infection (fever, chills, cough, or new shortness of breath).   Hospital follow up admission from 3/2-3/5 due to increased coughing, shortness of breath and was unable to sleep.  She was treated with rocephin IV and doxycycline. Her CXR revealed Patchy bilateral airspace opacities concerning for pneumonia with suspected small bilateral effusions.   Since being in the hospital her blood sugar is elevated up to 225. She gave  herself 5 units of Lantus this morning.  She does not have home health and does not have home oxygen.  She reports she is having trouble breathing occurs with and without exertion.  She reports it is worse at night, has been up since 3am this morning.  While hospitalized her Hgb was down to 8.5.  She is being followed by Oncology/Hematology for her chronic anemia    Past Medical History:  Diagnosis Date  . Acute kidney injury (nontraumatic) ([place])   . Acute renal failure (ARF) ([place]) 06/08/2018  . Cancer [facility]) 2008   Colon   . Diabetes mellitus without complication ([place])   . Gout 09/08/2018  . Hypertension   . Macrocytic anemia    Past Surgical History:  Procedure Laterality Date  . COLON SURGERY    . DEBRIDMENT OF DECUBITUS ULCER N/A 06/12/2018   Procedure: DEBRIDMENT OF DECUBITUS ULCER;  Surgeon: Wallace Going, DO;  [facility];  Service: Plastics;  Laterality: N/A;  . IR FLUORO GUIDE PORT INSERTION RIGHT  04/02/2017  . IR REMOVAL TUN ACCESS W/ PORT W/O FL MOD SED  03/16/2019  . IR US GUIDE VASC ACCESS RIGHT  04/02/2017  . RIGHT HEART CATH N/A 04/23/2019   Procedure: RIGHT HEART CATH;  Surgeon: Jolaine Artist, MD;  Location: [place] [facility];  Service: Cardiovascular;  Laterality: N/A;  . RIGHT/LEFT HEART CATH AND CORONARY ANGIOGRAPHY N/A 05/21/2018   Procedure: RIGHT/LEFT HEART CATH AND CORONARY ANGIOGRAPHY;  Surgeon: Nelva Bush, MD;  Location: [place] [facility];  Service: Cardiovascular;  Laterality: N/A;  Current Meds  Medication Sig  . allopurinol (ZYLOPRIM) 100 MG tablet Take 100 mg by mouth daily.  Marland Kitchen amiodarone (PACERONE) 200 MG tablet TAKE 1 TABLET BY MOUTH EVERY DAY (Patient taking differently: Take 200 mg by mouth daily. )  . amLODipine (NORVASC) 10 MG tablet Take 1 tablet (10 mg total) by mouth daily.  Marland Kitchen atorvastatin (LIPITOR) 40 MG tablet TAKE 1 TABLET BY MOUTH EVERY DAY (Patient taking differently: Take 40 mg by mouth daily at 6 PM. )  . blood  glucose meter kit and supplies Dispense based on patient and insurance preference. Use up to four times daily as directed. (FOR ICD-10 E10.9, E11.9).  Marland Kitchen Blood Glucose Monitoring Suppl (ONETOUCH ULTRALINK) w/Device KIT Check blood sugars twice daily E11.9  . cefdinir (OMNICEF) 300 MG capsule Take 1 capsule (300 mg total) by mouth daily for 4 days.  . colchicine 0.6 MG tablet TAKE 1 TABLET BY MOUTH TWICE A DAY AS NEEDED FOR GOUT (Patient taking differently: Take 0.6 mg by mouth daily. )  . doxazosin (CARDURA) 1 MG tablet Take 1 tablet (1 mg total) by mouth at bedtime.  Marland Kitchen doxycycline (VIBRA-TABS) 100 MG tablet Take 1 tablet (100 mg total) by mouth 2 (two) times daily for 4 days.  . furosemide (LASIX) 40 MG tablet TAKE 1 TABLET BY MOUTH EVERY DAY (Patient taking differently: Take 40 mg by mouth daily. )  . glucose blood test strip Use as instructed  . guaiFENesin-dextromethorphan (ROBITUSSIN DM) 100-10 MG/5ML syrup Take 5 mLs by mouth every 4 (four) hours as needed for cough.  . hydrALAZINE (APRESOLINE) 100 MG tablet Take 1 tablet (100 mg total) by mouth 3 (three) times daily.  . insulin glargine (LANTUS) 100 unit/mL SOPN Inject 0.15 mLs (15 Units total) into the skin daily. (Patient taking differently: Inject 5 Units into the skin 2 (two) times daily as needed (blood sugar over 150). )  . Insulin Pen Needle 29G X 5MM MISC Use as directed  . JANUVIA 50 MG tablet TAKE 1 TABLET BY MOUTH EVERY DAY (Patient taking differently: Take 50 mg by mouth daily. )  . Lancets (ONETOUCH ULTRASOFT) lancets Use as instructed to check blood sugars twice daily E11.9  . latanoprost (XALATAN) 0.005 % ophthalmic solution Place 1 drop into both eyes at bedtime.   Marland Kitchen levothyroxine (SYNTHROID) 88 MCG tablet Take 1 tablet (88 mcg total) by mouth daily.  . Multiple Vitamins-Minerals (CENTRUM SILVER 50+WOMEN) TABS Take 1 tablet by mouth daily.  Marland Kitchen oxymetazoline (AFRIN) 0.05 % nasal spray Place 1 spray into both nostrils 2 (two)  times daily as needed for congestion.  . sildenafil (REVATIO) 20 MG tablet Take 1 tablet (20 mg total) by mouth 3 (three) times daily.     Allergies:   Ancef [cefazolin] and Sulfa antibiotics   Social History   Tobacco Use  . Smoking status: Former Smoker    Packs/day: 0.25    Years: 20.00    Pack years: 5.00    Types: Cigarettes    Quit date: 01/31/2001    Years since quitting: 18.6  . Smokeless tobacco: Former Network engineer Use Topics  . Alcohol use: Yes    Alcohol/week: 2.0 standard drinks    Types: 2 Shots of liquor per week  . Drug use: Never     Family Hx: The patient's family history includes Cervical cancer in her mother; Diabetes in her mother; HIV/AIDS in her brother; Heart attack in her father; Hypertension in her brother, mother, sister, sister, and  sister; Prostate cancer in her brother.  ROS:   Please see the history of present illness.    Review of Systems  Constitutional: Negative.   Respiratory: Negative.   Cardiovascular: Negative.   Psychiatric/Behavioral: Negative.     All other systems reviewed and are negative.   Labs/Other Tests and Data Reviewed:    Recent Labs: 06/22/2019: TSH 6.810 09/14/2019: ALT 30; B Natriuretic Peptide 366.0 09/17/2019: BUN 45; Creatinine, Ser 1.80; Hemoglobin 8.3; Magnesium 1.7; Platelets 238; Potassium 3.7; Sodium 137   Recent Lipid Panel Lab Results  Component Value Date/Time   CHOL 143 03/20/2019 04:06 AM   TRIG 43 03/20/2019 04:06 AM   HDL 48 03/20/2019 04:06 AM   CHOLHDL 3.0 03/20/2019 04:06 AM   LDLCALC 86 03/20/2019 04:06 AM    Wt Readings from Last 3 Encounters:  09/17/19 180 lb 14.4 oz (82.1 kg)  09/09/19 189 lb 12.8 oz (86.1 kg)  08/05/19 181 lb (82.1 kg)     Exam:    Vital Signs:  BP (!) 136/48 (BP Location: Left Arm, Patient Position: Sitting, Cuff Size: Small)   Pulse 80     Physical Exam  Constitutional: She is oriented to person, place, and time and well-developed, well-nourished, and in no  distress. No distress.  Pulmonary/Chest: She is in respiratory distress.  She is unable to speak in complete sentences while talking on the phone.  Will yell out during telephone call.    Neurological: She is alert and oriented to person, place, and time.  Psychiatric: Mood, memory, affect and judgment normal.    ASSESSMENT & PLAN:    1. Community acquired pneumonia, unspecified laterality  Recently diagnosed with CAP treated with rocephin and doxycycline. She finishes her dose tomorrow.   2. Type 2 diabetes mellitus without complication, without long-term current use of insulin (HCC)  Blood sugar has been elevated in the last few days she took 5 units of Insulin   She is going back to the ER for evaluation however when she returns home she will likely need to take lantus 10 units nightly due to the stress of her illness.   3. Acute respiratory distress  She is having difficulty with holding a conversation, takes several deep breaths while speaking on the phone, she also reports extreme fatigue.     I have advised her to go to ER by calling EMS for further evaluation.    COVID-19 Education: The signs and symptoms of COVID-19 were discussed with the patient and how to seek care for testing (follow up with PCP or arrange E-visit).  The importance of social distancing was discussed today.  Patient Risk:   After full review of this patients clinical status, I feel that they are at least moderate risk at this time.  Time:   Today, I have spent 12 minutes/ seconds with the patient with telehealth technology discussing above diagnoses.     Medication Adjustments/Labs and Tests Ordered: Current medicines are reviewed at length with the patient today.  Concerns regarding medicines are outlined above.   Tests Ordered: Orders Placed This Encounter  Procedures  . DG Chest 2 View    Medication Changes: No orders of the defined types were placed in this encounter.   Disposition:   Follow up prn  Signed, [name], FNP

## 2019-09-21 ENCOUNTER — Inpatient Hospital Stay (HOSPITAL_COMMUNITY): Payer: PPO

## 2019-09-21 ENCOUNTER — Telehealth (HOSPITAL_COMMUNITY): Payer: Self-pay | Admitting: Nurse Practitioner

## 2019-09-21 ENCOUNTER — Ambulatory Visit (HOSPITAL_COMMUNITY): Payer: PPO

## 2019-09-21 DIAGNOSIS — J80 Acute respiratory distress syndrome: Secondary | ICD-10-CM

## 2019-09-21 DIAGNOSIS — I340 Nonrheumatic mitral (valve) insufficiency: Secondary | ICD-10-CM

## 2019-09-21 DIAGNOSIS — D649 Anemia, unspecified: Secondary | ICD-10-CM

## 2019-09-21 DIAGNOSIS — I342 Nonrheumatic mitral (valve) stenosis: Secondary | ICD-10-CM

## 2019-09-21 DIAGNOSIS — J9601 Acute respiratory failure with hypoxia: Secondary | ICD-10-CM

## 2019-09-21 DIAGNOSIS — I352 Nonrheumatic aortic (valve) stenosis with insufficiency: Secondary | ICD-10-CM

## 2019-09-21 DIAGNOSIS — I5031 Acute diastolic (congestive) heart failure: Secondary | ICD-10-CM | POA: Diagnosis present

## 2019-09-21 DIAGNOSIS — I272 Pulmonary hypertension, unspecified: Secondary | ICD-10-CM

## 2019-09-21 LAB — RETICULOCYTES
Immature Retic Fract: 37 % — ABNORMAL HIGH (ref 2.3–15.9)
RBC.: 2.55 MIL/uL — ABNORMAL LOW (ref 3.87–5.11)
Retic Count, Absolute: 87.2 10*3/uL (ref 19.0–186.0)
Retic Ct Pct: 3.4 % — ABNORMAL HIGH (ref 0.4–3.1)

## 2019-09-21 LAB — CBC WITH DIFFERENTIAL/PLATELET
Abs Immature Granulocytes: 1.54 10*3/uL — ABNORMAL HIGH (ref 0.00–0.07)
Basophils Absolute: 0.1 10*3/uL (ref 0.0–0.1)
Basophils Relative: 0 %
Eosinophils Absolute: 0 10*3/uL (ref 0.0–0.5)
Eosinophils Relative: 0 %
HCT: 24.4 % — ABNORMAL LOW (ref 36.0–46.0)
Hemoglobin: 7.9 g/dL — ABNORMAL LOW (ref 12.0–15.0)
Immature Granulocytes: 6 %
Lymphocytes Relative: 4 %
Lymphs Abs: 1 10*3/uL (ref 0.7–4.0)
MCH: 29.9 pg (ref 26.0–34.0)
MCHC: 32.4 g/dL (ref 30.0–36.0)
MCV: 92.4 fL (ref 80.0–100.0)
Monocytes Absolute: 2.1 10*3/uL — ABNORMAL HIGH (ref 0.1–1.0)
Monocytes Relative: 8 %
Neutro Abs: 22.2 10*3/uL — ABNORMAL HIGH (ref 1.7–7.7)
Neutrophils Relative %: 82 %
Platelets: 281 10*3/uL (ref 150–400)
RBC: 2.64 MIL/uL — ABNORMAL LOW (ref 3.87–5.11)
RDW: 21.4 % — ABNORMAL HIGH (ref 11.5–15.5)
WBC: 27 10*3/uL — ABNORMAL HIGH (ref 4.0–10.5)
nRBC: 1.1 % — ABNORMAL HIGH (ref 0.0–0.2)

## 2019-09-21 LAB — RESPIRATORY PANEL BY PCR

## 2019-09-21 LAB — STREP PNEUMONIAE URINARY ANTIGEN: Strep Pneumo Urinary Antigen: NEGATIVE

## 2019-09-21 LAB — TROPONIN I (HIGH SENSITIVITY)
Troponin I (High Sensitivity): 254 ng/L (ref <18)
Troponin I (High Sensitivity): 232 ng/L (ref <18)

## 2019-09-21 LAB — FOLATE: Folate: 17.3 ng/mL (ref >5.9)

## 2019-09-21 LAB — COMPREHENSIVE METABOLIC PANEL
ALT: 19 U/L (ref 0–44)
AST: 29 U/L (ref 15–41)
Albumin: 2.7 g/dL — ABNORMAL LOW (ref 3.5–5.0)
Alkaline Phosphatase: 135 U/L — ABNORMAL HIGH (ref 38–126)
Anion gap: 8 (ref 5–15)
BUN: 62 mg/dL — ABNORMAL HIGH (ref 8–23)
CO2: 22 mmol/L (ref 22–32)
Calcium: 8.9 mg/dL (ref 8.9–10.3)
Chloride: 108 mmol/L (ref 98–111)
Creatinine, Ser: 2.26 mg/dL — ABNORMAL HIGH (ref 0.44–1.00)
GFR calc Af Amer: 26 mL/min — ABNORMAL LOW (ref >60)
GFR calc non Af Amer: 22 mL/min — ABNORMAL LOW (ref >60)
Glucose, Bld: 186 mg/dL — ABNORMAL HIGH (ref 70–99)
Potassium: 4 mmol/L (ref 3.5–5.1)
Sodium: 138 mmol/L (ref 135–145)
Total Bilirubin: 1.2 mg/dL (ref 0.3–1.2)
Total Protein: 7.1 g/dL (ref 6.5–8.1)

## 2019-09-21 LAB — ECHOCARDIOGRAM COMPLETE BUBBLE STUDY
Height: 64 in
Weight: 2880 oz

## 2019-09-21 LAB — HEMOGLOBIN A1C
Hgb A1c MFr Bld: 6.5 % — ABNORMAL HIGH (ref 4.8–5.6)
Mean Plasma Glucose: 139.85 mg/dL

## 2019-09-21 LAB — BLOOD GAS, ARTERIAL
Acid-base deficit: 2.3 mmol/L — ABNORMAL HIGH (ref 0.0–2.0)
Bicarbonate: 22 mmol/L (ref 20.0–28.0)
FIO2: 100
O2 Saturation: 78.3 %
Patient temperature: 98.8
pCO2 arterial: 37.9 mmHg (ref 32.0–48.0)
pH, Arterial: 7.382 (ref 7.350–7.450)
pO2, Arterial: 47.4 mmHg — ABNORMAL LOW (ref 83.0–108.0)

## 2019-09-21 LAB — HEPARIN LEVEL (UNFRACTIONATED): Heparin Unfractionated: <0.1 IU/mL — ABNORMAL LOW (ref 0.30–0.70)

## 2019-09-21 LAB — EXPECTORATED SPUTUM ASSESSMENT W GRAM STAIN, RFLX TO RESP C

## 2019-09-21 LAB — MRSA PCR SCREENING: MRSA by PCR: NEGATIVE

## 2019-09-21 LAB — MAGNESIUM: Magnesium: 1.6 mg/dL — ABNORMAL LOW (ref 1.7–2.4)

## 2019-09-21 LAB — GLUCOSE, CAPILLARY
Glucose-Capillary: 163 mg/dL — ABNORMAL HIGH (ref 70–99)
Glucose-Capillary: 118 mg/dL — ABNORMAL HIGH (ref 70–99)
Glucose-Capillary: 157 mg/dL — ABNORMAL HIGH (ref 70–99)
Glucose-Capillary: 117 mg/dL — ABNORMAL HIGH (ref 70–99)
Glucose-Capillary: 106 mg/dL — ABNORMAL HIGH (ref 70–99)

## 2019-09-21 LAB — PHOSPHORUS: Phosphorus: 3.8 mg/dL (ref 2.5–4.6)

## 2019-09-21 LAB — PROCALCITONIN: Procalcitonin: 1.84 ng/mL

## 2019-09-21 LAB — LACTIC ACID, PLASMA
Lactic Acid, Venous: 1 mmol/L (ref 0.5–1.9)
Lactic Acid, Venous: 1.2 mmol/L (ref 0.5–1.9)

## 2019-09-21 LAB — SARS CORONAVIRUS 2 (TAT 6-24 HRS): SARS Coronavirus 2: NEGATIVE

## 2019-09-21 LAB — TSH: TSH: 2.435 u[IU]/mL (ref 0.350–4.500)

## 2019-09-21 LAB — VITAMIN B12: Vitamin B-12: 1877 pg/mL — ABNORMAL HIGH (ref 180–914)

## 2019-09-21 MED ORDER — ACETAMINOPHEN 650 MG RE SUPP: 650.0000 mg | Freq: Four times a day (QID) | RECTAL | Status: DC | PRN

## 2019-09-21 MED ORDER — ALLOPURINOL 100 MG PO TABS
100.0000 mg | ORAL_TABLET | Freq: Every day | ORAL | Status: DC
  Administered 2019-09-21 – 2019-10-03 (×12): 100 mg via ORAL
  Filled 2019-09-21 (×12): qty 1

## 2019-09-21 MED ORDER — GUAIFENESIN-DM 100-10 MG/5ML PO SYRP
5.0000 mL | ORAL_SOLUTION | ORAL | Status: DC | PRN
  Administered 2019-09-21: 5 mL via ORAL
  Filled 2019-09-21: qty 10

## 2019-09-21 MED ORDER — FENTANYL CITRATE (PF) 100 MCG/2ML IJ SOLN
INTRAMUSCULAR | Status: AC
  Administered 2019-09-22: 100 ug
  Filled 2019-09-21: qty 4

## 2019-09-21 MED ORDER — SODIUM CHLORIDE 0.9 % IV SOLN: 250.0000 mL | INTRAVENOUS | Status: DC | PRN

## 2019-09-21 MED ORDER — SALINE SPRAY 0.65 % NA SOLN
1.0000 | NASAL | Status: DC | PRN
  Administered 2019-09-21: 1 via NASAL
  Filled 2019-09-21: qty 44

## 2019-09-21 MED ORDER — FUROSEMIDE 10 MG/ML IJ SOLN
40.0000 mg | Freq: Once | INTRAMUSCULAR | Status: AC
  Administered 2019-09-21: 40 mg via INTRAVENOUS
  Filled 2019-09-21: qty 4

## 2019-09-21 MED ORDER — NOREPINEPHRINE 4 MG/250ML-% IV SOLN
INTRAVENOUS | Status: AC
  Filled 2019-09-21: qty 250

## 2019-09-21 MED ORDER — HYDROCODONE-ACETAMINOPHEN 5-325 MG PO TABS: 1.0000 | ORAL_TABLET | ORAL | Status: DC | PRN

## 2019-09-21 MED ORDER — ONDANSETRON HCL 4 MG/2ML IJ SOLN: 4.0000 mg | Freq: Four times a day (QID) | INTRAMUSCULAR | Status: DC | PRN

## 2019-09-21 MED ORDER — HYDROCODONE-HOMATROPINE 5-1.5 MG/5ML PO SYRP
5.0000 mL | ORAL_SOLUTION | ORAL | Status: DC | PRN
  Administered 2019-09-21: 5 mL via ORAL
  Filled 2019-09-21: qty 5

## 2019-09-21 MED ORDER — INSULIN ASPART 100 UNIT/ML ~~LOC~~ SOLN
0.0000 [IU] | SUBCUTANEOUS | Status: DC
  Administered 2019-09-21: 2 [IU] via SUBCUTANEOUS
  Administered 2019-09-21: 3 [IU] via SUBCUTANEOUS
  Administered 2019-09-21 – 2019-09-22 (×2): 2 [IU] via SUBCUTANEOUS
  Administered 2019-09-22: 5 [IU] via SUBCUTANEOUS
  Administered 2019-09-22: 3 [IU] via SUBCUTANEOUS

## 2019-09-21 MED ORDER — FUROSEMIDE 10 MG/ML IJ SOLN
INTRAMUSCULAR | Status: AC
  Filled 2019-09-21: qty 4

## 2019-09-21 MED ORDER — FENTANYL 2500MCG IN NS 250ML (10MCG/ML) PREMIX INFUSION
0.0000 ug/h | INTRAVENOUS | Status: DC
  Administered 2019-09-21: 100 ug/h via INTRAVENOUS
  Administered 2019-09-22: 200 ug/h via INTRAVENOUS
  Administered 2019-09-23: 75 ug/h via INTRAVENOUS
  Administered 2019-09-24: 125 ug/h via INTRAVENOUS
  Administered 2019-09-25: 75 ug/h via INTRAVENOUS
  Filled 2019-09-21 (×5): qty 250

## 2019-09-21 MED ORDER — DEXTROSE 5 % IV SOLN: INTRAVENOUS | Status: AC

## 2019-09-21 MED ORDER — VANCOMYCIN HCL IN DEXTROSE 1-5 GM/200ML-% IV SOLN
1000.0000 mg | INTRAVENOUS | Status: DC
  Administered 2019-09-22: 1000 mg via INTRAVENOUS
  Filled 2019-09-21: qty 200

## 2019-09-21 MED ORDER — HEPARIN SODIUM (PORCINE) 5000 UNIT/ML IJ SOLN
5000.0000 [IU] | Freq: Three times a day (TID) | INTRAMUSCULAR | Status: DC
  Administered 2019-09-21 – 2019-10-03 (×35): 5000 [IU] via SUBCUTANEOUS
  Filled 2019-09-21 (×37): qty 1

## 2019-09-21 MED ORDER — BENZONATATE 100 MG PO CAPS
200.0000 mg | ORAL_CAPSULE | Freq: Three times a day (TID) | ORAL | Status: DC
  Administered 2019-09-21 (×3): 200 mg via ORAL
  Filled 2019-09-21 (×3): qty 2

## 2019-09-21 MED ORDER — FUROSEMIDE 40 MG PO TABS: 40.0000 mg | ORAL_TABLET | Freq: Every day | ORAL | Status: DC

## 2019-09-21 MED ORDER — SODIUM CHLORIDE 0.9% FLUSH
3.0000 mL | Freq: Two times a day (BID) | INTRAVENOUS | Status: DC
  Administered 2019-09-21 – 2019-10-03 (×24): 3 mL via INTRAVENOUS

## 2019-09-21 MED ORDER — HYDROCODONE-HOMATROPINE 5-1.5 MG/5ML PO SYRP: 5.0000 mL | ORAL_SOLUTION | ORAL | Status: DC | PRN

## 2019-09-21 MED ORDER — SODIUM CHLORIDE 0.9% FLUSH
3.0000 mL | INTRAVENOUS | Status: DC | PRN
  Administered 2019-09-26: 3 mL via INTRAVENOUS

## 2019-09-21 MED ORDER — NOREPINEPHRINE 4 MG/250ML-% IV SOLN
0.0000 ug/min | INTRAVENOUS | Status: DC
  Administered 2019-09-21: 2 ug/min via INTRAVENOUS
  Administered 2019-09-22: 10 ug/min via INTRAVENOUS
  Administered 2019-09-22: 40 ug/min via INTRAVENOUS
  Administered 2019-09-22: 16 ug/min via INTRAVENOUS
  Administered 2019-09-22: 30 ug/min via INTRAVENOUS
  Administered 2019-09-22: 35 ug/min via INTRAVENOUS
  Administered 2019-09-22: 40 ug/min via INTRAVENOUS
  Administered 2019-09-22 – 2019-09-24 (×2): 2 ug/min via INTRAVENOUS
  Administered 2019-09-25: 5 ug/min via INTRAVENOUS
  Filled 2019-09-21 (×8): qty 250

## 2019-09-21 MED ORDER — ATORVASTATIN CALCIUM 40 MG PO TABS
40.0000 mg | ORAL_TABLET | Freq: Every day | ORAL | Status: DC
  Administered 2019-09-21 – 2019-10-02 (×10): 40 mg via ORAL
  Filled 2019-09-21 (×12): qty 1

## 2019-09-21 MED ORDER — ACETAMINOPHEN 325 MG PO TABS: 650.0000 mg | ORAL_TABLET | Freq: Four times a day (QID) | ORAL | Status: DC | PRN

## 2019-09-21 MED ORDER — ROCURONIUM BROMIDE 50 MG/5ML IV SOLN
100.0000 mg | Freq: Once | INTRAVENOUS | Status: AC
  Administered 2019-09-22: 100 mg via INTRAVENOUS
  Filled 2019-09-21: qty 10

## 2019-09-21 MED ORDER — MAGNESIUM SULFATE IN D5W 1-5 GM/100ML-% IV SOLN
1.0000 g | Freq: Once | INTRAVENOUS | Status: AC
  Administered 2019-09-21: 1 g via INTRAVENOUS
  Filled 2019-09-21: qty 100

## 2019-09-21 MED ORDER — INSULIN GLARGINE 100 UNIT/ML ~~LOC~~ SOLN
5.0000 [IU] | Freq: Every day | SUBCUTANEOUS | Status: DC
  Administered 2019-09-21 – 2019-09-27 (×8): 5 [IU] via SUBCUTANEOUS
  Filled 2019-09-21 (×8): qty 0.05

## 2019-09-21 MED ORDER — SODIUM CHLORIDE 0.9 % IV SOLN
2.0000 g | INTRAVENOUS | Status: AC
  Administered 2019-09-21 – 2019-09-26 (×6): 2 g via INTRAVENOUS
  Filled 2019-09-21 (×6): qty 2

## 2019-09-21 MED ORDER — MELATONIN 3 MG PO TABS
6.0000 mg | ORAL_TABLET | Freq: Every evening | ORAL | Status: AC | PRN
  Administered 2019-09-21 – 2019-09-29 (×4): 6 mg via ORAL
  Filled 2019-09-21 (×4): qty 2

## 2019-09-21 MED ORDER — LATANOPROST 0.005 % OP SOLN
1.0000 [drp] | Freq: Every day | OPHTHALMIC | Status: DC
  Administered 2019-09-21 – 2019-10-02 (×11): 1 [drp] via OPHTHALMIC
  Filled 2019-09-21 (×2): qty 2.5

## 2019-09-21 MED ORDER — CHLORHEXIDINE GLUCONATE CLOTH 2 % EX PADS
6.0000 | MEDICATED_PAD | Freq: Every day | CUTANEOUS | Status: DC
  Administered 2019-09-22: 6 via TOPICAL

## 2019-09-21 MED ORDER — EPINEPHRINE 1 MG/10ML IJ SOSY
PREFILLED_SYRINGE | INTRAMUSCULAR | Status: AC
  Filled 2019-09-21: qty 10

## 2019-09-21 MED ORDER — ONDANSETRON HCL 4 MG PO TABS: 4.0000 mg | ORAL_TABLET | Freq: Four times a day (QID) | ORAL | Status: DC | PRN

## 2019-09-21 MED ORDER — MORPHINE SULFATE (PF) 2 MG/ML IV SOLN
2.0000 mg | INTRAVENOUS | Status: DC | PRN
  Administered 2019-09-21 – 2019-09-27 (×4): 2 mg via INTRAVENOUS
  Filled 2019-09-21 (×4): qty 1

## 2019-09-21 MED ORDER — SILDENAFIL CITRATE 20 MG PO TABS
20.0000 mg | ORAL_TABLET | Freq: Three times a day (TID) | ORAL | Status: DC
  Administered 2019-09-21 – 2019-10-03 (×32): 20 mg via ORAL
  Filled 2019-09-21 (×38): qty 1

## 2019-09-21 MED ORDER — ORAL CARE MOUTH RINSE
15.0000 mL | Freq: Two times a day (BID) | OROMUCOSAL | Status: DC
  Administered 2019-09-21 (×2): 15 mL via OROMUCOSAL

## 2019-09-21 MED ORDER — AMIODARONE HCL 200 MG PO TABS: 200.0000 mg | ORAL_TABLET | Freq: Every day | ORAL | Status: DC

## 2019-09-21 MED ORDER — LEVOTHYROXINE SODIUM 88 MCG PO TABS
88.0000 ug | ORAL_TABLET | Freq: Every day | ORAL | Status: DC
  Administered 2019-09-21 – 2019-10-03 (×10): 88 ug via ORAL
  Filled 2019-09-21 (×11): qty 1

## 2019-09-21 MED ORDER — PROPOFOL 1000 MG/100ML IV EMUL
5.0000 ug/kg/min | INTRAVENOUS | Status: DC
  Administered 2019-09-21 – 2019-09-22 (×2): 20 ug/kg/min via INTRAVENOUS
  Administered 2019-09-22: 30 ug/kg/min via INTRAVENOUS
  Filled 2019-09-21 (×4): qty 100

## 2019-09-21 NOTE — Procedures (Signed)
Intubation Procedure Note Patricia Mata [id number] 12-23-1955  Procedure: Intubation Indications: Respiratory insufficiency  Procedure Details Consent: Risks of procedure as well as the alternatives and risks of each were explained to the (patient/caregiver).  Consent for procedure obtained. Time Out: Verified patient identification, verified procedure, site/side was marked, verified correct patient position, special equipment/implants available, medications/allergies/relevent history reviewed, required imaging and test results available.  Performed  Maximum sterile technique was used including hand hygiene.  4    Evaluation Hemodynamic Status: BP stable throughout; O2 sats: stable throughout Patient's Current Condition: unstable Complications: No apparent complications Patient did tolerate procedure well. Chest X-ray ordered to verify placement.  CXR: tube position acceptable   Of note patients airway anterior and difficult to visualize with Video laryngoscope attempted with MAC blade after ant still unable to visualize cords Anesthesia was at bedside and were able to attain airway with Miller blade.   Tyna Jaksch 09/21/2019

## 2019-09-21 NOTE — Progress Notes (Signed)
Bilateral lower extremity venous duplex has been completed. Preliminary results can be found in CV Proc through chart review.   09/21/19 11:39 AM Patricia Mata RVT

## 2019-09-21 NOTE — Progress Notes (Signed)
MD and Echo at bedside; will start CPT at later time.

## 2019-09-21 NOTE — Progress Notes (Signed)
eLink Physician-Brief Progress Note Patient Name: Patricia Mata DOB: 28-Dec-1955 MRN: [id number]   Date of Service  09/21/2019  HPI/Events of Note  Hypoxemic respiratory failure.  eICU Interventions  Pt assisted into the prone position, ABG in 30 minutes, Pt consented for intubation if ABG indicates it.        [name] [name] 09/21/2019, 9:35 PM

## 2019-09-21 NOTE — Progress Notes (Signed)
Echocardiogram 2D Echocardiogram has been performed.  Oneal Deputy Samual Beals 09/21/2019, 9:49 AM

## 2019-09-21 NOTE — Progress Notes (Addendum)
[facility]  PROGRESS NOTE  Nyeemah Jennette DPO:[id number] DOB: 11/08/1955 DOA: 09/20/2019 PCP: [name], FNP Admit date - 09/20/2019   Admitting Physician Toy Baker, MD  Outpatient Primary MD for the patient is [name], FNP  LOS - 1 Brief Narrative   Adrinne Sze is a 64 y.o. year old female with medical history significant for HTN, MDS with transfusion dependent anemia, T2DM, Pulm HTN, AOCKD, AF, severe aortic stenosis history of COVID (03/2019), last hospitalization  On 3/1-3/5 for acute hypoxic respiratory failure secondary to multifocal bilateral CAP treated with doxycycline and ceftriaxone as inpatient and discharged with 4 days of cefdinir with no O2 requirements. She presents on 09/20/2019 with progressive SOB on exertion and in conversation since recent dischage.   ED Course:  Patient was afebrile, tachypneic and sattign 82% on 4 L on arrival and required 15L to maintain oxygen saturation. Lab work notable for WBC 27 (14 on recent discharge), hgb 7.9 (baseline 8), troponin delta of 62--215, COVID negative, CRP  ( positive 03/2019), Cr 2.55, BNP 548.2, D-dimer 2.53( 03/2019), procalcitonin 2.18.  CXR--interval worsening of bilateral opacities  CT chest w/o contrast ( given kidney insufficiency)--diffuse patchy airspace disease consistent with ARDS or pulmonary edema or interstitial pneumonitis with cardiomegaly and pulm HTN.  Patient was admitted with working diagnosis of acute hypoxic respiratory failure with presumed infectious source in setting of recent bacterial pneumonia. She was started on Cefepime, vancomycin, given IV lasix and heparin infusion   Subjective  Today she thinks her breathing is better than when she came in. No chest pain. No abdominal pain.   A & P  Acute hypoxic respiratory failure in patient with prior history of COVID pneumonia( 9.2020) and recent treatment for bacterial pneumonia, concerning for ARDS CT chest more indicative of multifocal  PNA(likely hospital acquired) with ARDS. Given worsening leukocytosis, elevated procalcitonin and worsening infiltrates bacterial infection causing pna very likely. Elevated d-dimer (2.53) PE (heparin gtt started but then stopped given alternative diagnosis) is possible but symptoms of productive cough worsening after recent hospital stay makes that less likely. Likely also complicated by CHF, pulmonary HTN,. Very tenuous respiratory status ABG AO2 71.9 -f/u pending sputum culture, legionella, (Strep pna and COVID  neg) -empiric Vancomycin (renal dosing--MRSA screen negative) and Cefepime --airway clearance: CPT, IS, flutter vlave --Appreciate PCCM and Cardiology recommendations --obtain venous duplex since CTA w contrast not able with kidney insufficiency, assess right heart strain on TTE --doubt amiodarone toxicity but will stop per cardiology --palliative consulted, goals of care  Severe sepsis, improving Lactic acid 2.3, leukocytosis, tachypnea and new O2 requirement related to presumed pna Lactic acidosis has resolved -follow blood cultures -antibiotics as above -daily CBC  Elevated troponin, suspect demand in setting of sepsis 62--215. No ischemic changes on EKG. No chest pain. Likely demand ischemia --continue to trend until peaks --f/u TTE --cardiology consulted, appreciate recommendations  Hypomagnesemia Mg 1.6. 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 Slightly elevated at 2.55 on admission with BUN of 65 and CO2 19 Follows with [name] as outpatient - monitor BMP, avoid nephrotoxins(renally dosing vancomycin), monitor output  Acute on Chronic diastolic CHF (EF 43-15% on TTE, 02/2019) with  Bnp elevated pulmonary edema  could be contributing based on imaging but  not overtly volume up on exam Takes lasix 40 mg PO at home -repeat TTE pending -s/p IV lasix 40 mg x1(3/8) -daily weights, strict I/o,   HTN, at goal On doxazosin, amlodipine and hydralazine as outpt, holding in setting of severe sepsis on admission -monitor BP in case need to resume  Severe aortic stenosis and severe pulmonary hypertension Mild PAH on RHC in 04/2019. Severe AS by TTE however per last RHC deemed as mild per cardiology --followed by Dr. Gwenlyn Found as outpatient,  - home sildenafil --appreciate cardiology recommendations, not a candidate for TAVR  T2DM, A1c 6.5 Home regimen of 15 U lantus and januvia - CBG monitoring with Sliding scale -decreased to Lantus 5 U given NPO  Paroxysmal atrial fibrillation Currently in normal sinus rhythm at normal rate CHADSVAASC 4. Not on anticoagulation given transfusion depend edema -on amiodarone(could contribute to lung disease???)--cardiology recommends stopping  Hypothyroidism TSH wnl -home synthroid to continue  Gout -resume home allopurinol  HLD - home lipitor  Stage III colon cancer s/[p hemicoletomy(2008)    Family Communication  :  Will update husband [name] on 3/9  Code Status :  FULL CODE per admission  Disposition Plan  :  Patient is from home. Anticipated d/c date: 3-4 days. Barriers to d/c or necessity for inpatient status: she is at high risk for intubation given she is on very high O2 requirements and currently receiving IV antibiotics Consults  :  PCCM, Cardiology  Procedures  :  TTE, 3/9, pending  DVT Prophylaxis  : Heparin subcut   Lab Results  Component Value Date   PLT 281 09/21/2019    Diet :  Diet Order            Diet NPO time specified  Diet effective now               Inpatient Medications Scheduled Meds: . amiodarone  200 mg Oral Daily  . atorvastatin  40 mg Oral q1800  . Chlorhexidine Gluconate Cloth  6 each Topical Q0600  . heparin  injection (subcutaneous)  5,000 Units Subcutaneous Q8H  . insulin aspart  0-9 Units Subcutaneous Q4H  . insulin glargine  5 Units Subcutaneous Q2200  . latanoprost  1 drop Both Eyes QHS  . levothyroxine  88 mcg Oral Q0600  . mouth rinse  15 mL Mouth Rinse BID  . sildenafil  20 mg Oral TID  . sodium chloride flush  3 mL Intravenous Q12H   Continuous Infusions: . sodium chloride 10 mL/hr at 09/21/19 0200  . sodium chloride    . ceFEPime (MAXIPIME) IV    . magnesium sulfate bolus IVPB    . [START ON 09/22/2019] vancomycin     PRN Meds:.sodium chloride, sodium chloride, acetaminophen **OR** acetaminophen, guaiFENesin-dextromethorphan, HYDROcodone-acetaminophen, Melatonin, ondansetron **OR** ondansetron (ZOFRAN) IV, sodium chloride flush  Antibiotics  :   Anti-infectives (From admission, onward)   Start     Dose/Rate Route Frequency Ordered Stop   09/22/19 1200  vancomycin (VANCOCIN) IVPB 1000 mg/200 mL premix     1,000 mg 200 mL/hr over 60 Minutes Intravenous Every 36 hours 09/21/19 0411     09/21/19 1800  ceFEPIme (MAXIPIME) 2 g in sodium chloride 0.9 % 100 mL IVPB     2 g 200 mL/hr over 30 Minutes Intravenous Every 24 hours 09/21/19 0411     09/20/19 2245  vancomycin (VANCOREADY) IVPB 1500 mg/300 mL     1,500 mg 150 mL/hr over 120 Minutes Intravenous  Once 09/20/19 2236 09/21/19  5093   09/20/19 1830  ceFEPIme (MAXIPIME) 2 g in sodium chloride 0.9 % 100 mL IVPB     2 g 200 mL/hr over 30 Minutes Intravenous  Once 09/20/19 1823 09/20/19 2016       Objective   Vitals:   09/21/19 0308 09/21/19 0400 09/21/19 0427 09/21/19 0620  BP: (!) 132/20 (!) 128/29    Pulse: 81 82    Resp:  (!) 37  (!) 22  Temp:   97.7 F (36.5 C)   TempSrc:   Oral   SpO2: 91% 93%  94%  Weight:      Height:        SpO2: 94 % O2 Flow Rate (L/min): 45 L/min FiO2 (%): 100 %  Wt Readings from Last 3 Encounters:  09/20/19 81.6 kg  09/17/19 82.1 kg  09/09/19 86.1 kg     Intake/Output Summary (Last  24 hours) at 09/21/2019 0721 Last data filed at 09/21/2019 0600 Gross per 24 hour  Intake 447.75 ml  Output 1200 ml  Net -752.25 ml    Physical Exam:  Lying in bed, awake, oriented x4 RRR, SEM present, no appreciable JVD, no peripheral edema Increased respiratory effort on 15 L HF and NRB, abdominal muscle use, has to stop mid-sentence to catch breath, difficult to appreciate breath sounds on O2 Soft abdomen, no tenderness No rashes or bruising   I have personally reviewed the following:   Data Reviewed:  CBC Recent Labs  Lab 09/14/19 0736 09/14/19 0736 09/15/19 0334 09/16/19 0328 09/17/19 0501 09/20/19 1823 09/21/19 0300  WBC 27.5*   < > 17.4* 14.7* 14.6* 27.2* 27.0*  HGB 8.8*   < > 8.4* 8.3* 8.3* 7.7* 7.9*  HCT 26.1*   < > 25.8* 24.6* 25.6* 23.4* 24.4*  PLT 212   < > 208 206 238 285 281  MCV 90.6   < > 92.1 89.5 91.8 91.4 92.4  MCH 30.6   < > 30.0 30.2 29.7 30.1 29.9  MCHC 33.7   < > 32.6 33.7 32.4 32.9 32.4  RDW 20.5*   < > 20.4* 20.4* 20.4* 21.5* 21.4*  LYMPHSABS 0.5*  --   --   --   --   --  1.0  MONOABS 1.9*  --   --   --   --   --  2.1*  EOSABS 0.0  --   --   --   --   --  0.0  BASOSABS 0.1  --   --   --   --   --  0.1   < > = values in this interval not displayed.    Chemistries  Recent Labs  Lab 09/14/19 0736 09/14/19 0736 09/14/19 1551 09/16/19 0328 09/17/19 0501 09/20/19 1823 09/21/19 0300  NA 134*   < > 136 135 137 138 138  K 3.8   < > 4.0 3.7 3.7 4.5 4.0  CL 101   < > 104 102 105 106 108  CO2 20*   < > 23 20* 21* 19* 22  GLUCOSE 248*   < > 204* 196* 173* 330* 186*  BUN 49*   < > 48* 47* 45* 65* 62*  CREATININE 1.82*   < > 1.89* 1.58* 1.80* 2.55* 2.26*  CALCIUM 8.9   < > 8.9 8.9 9.0 8.9 8.9  MG  --   --   --  1.7 1.7 1.6* 1.6*  AST 42*  --   --   --   --  32 29  ALT 30  --   --   --   --  24 19  ALKPHOS 170*  --   --   --   --  147* 135*  BILITOT 1.4*  --   --   --   --  0.9 1.2   < > = values in this interval not displayed.    ------------------------------------------------------------------------------------------------------------------ No results for input(s): CHOL, HDL, LDLCALC, TRIG, CHOLHDL, LDLDIRECT in the last 72 hours.  Lab Results  Component Value Date   HGBA1C 6.5 (H) 09/21/2019   ------------------------------------------------------------------------------------------------------------------ Recent Labs    09/21/19 0300  TSH 2.435   ------------------------------------------------------------------------------------------------------------------ Recent Labs    09/20/19 1826 09/20/19 2131 09/21/19 0056 09/21/19 0300  VITAMINB12  --   --   --  1,877*  FOLATE  --   --   --  17.3  FERRITIN 1,870*  --   --   --   TIBC  --  187*  --   --   IRON  --  50  --   --   RETICCTPCT  --   --  3.4*  --     Coagulation profile No results for input(s): INR, PROTIME in the last 168 hours.  Recent Labs    09/20/19 1823  DDIMER 2.53*    Cardiac Enzymes No results for input(s): CKMB, TROPONINI, MYOGLOBIN in the last 168 hours.  Invalid input(s): CK ------------------------------------------------------------------------------------------------------------------    Component Value Date/Time   BNP 548.2 (H) 09/20/2019 1823    Micro Results Recent Results (from the past 240 hour(s))  SARS CORONAVIRUS 2 (TAT 6-24 HRS) Nasopharyngeal Nasopharyngeal Swab     Status: None   Collection Time: 09/14/19 12:51 PM   Specimen: Nasopharyngeal Swab  Result Value Ref Range Status   SARS Coronavirus 2 NEGATIVE NEGATIVE Final    Comment: (NOTE) SARS-CoV-2 target nucleic acids are NOT DETECTED. The SARS-CoV-2 RNA is generally detectable in upper and lower respiratory specimens during the acute phase of infection. Negative results do not preclude SARS-CoV-2 infection, do not rule out co-infections with other pathogens, and should not be used as the sole basis for treatment or other patient management  decisions. Negative results must be combined with clinical observations, patient history, and epidemiological information. The expected result is Negative. Fact Sheet for Patients: SugarRoll.be Fact Sheet for Healthcare Providers: https://www.woods-mathews.com/ This test is not yet approved or cleared by the [place] FDA and  has been authorized for detection and/or diagnosis of SARS-CoV-2 by FDA under an Emergency Use Authorization (EUA). This EUA will remain  in effect (meaning this test can be used) for the duration of the COVID-19 declaration under Section 56 4(b)(1) of the Act, 21 U.S.C. section 360bbb-3(b)(1), unless the authorization is terminated or revoked sooner. Performed at [facility], [place] [address], [place], [place] [address]   SARS CORONAVIRUS 2 (TAT 6-24 HRS) Nasopharyngeal Nasopharyngeal Swab     Status: None   Collection Time: 09/20/19  8:03 PM   Specimen: Nasopharyngeal Swab  Result Value Ref Range Status   SARS Coronavirus 2 NEGATIVE NEGATIVE Final    Comment: (NOTE) SARS-CoV-2 target nucleic acids are NOT DETECTED. The SARS-CoV-2 RNA is generally detectable in upper and lower respiratory specimens during the acute phase of infection. Negative results do not preclude SARS-CoV-2 infection, do not rule out co-infections with other pathogens, and should not be used as the sole basis for treatment or other patient management decisions. Negative results must be combined with clinical  observations, patient history, and epidemiological information. The expected result is Negative. Fact Sheet for Patients: SugarRoll.be Fact Sheet for Healthcare Providers: https://www.woods-mathews.com/ This test is not yet approved or cleared by the [place] FDA and  has been authorized for detection and/or diagnosis of SARS-CoV-2 by FDA under an Emergency Use Authorization (EUA). This  EUA will remain  in effect (meaning this test can be used) for the duration of the COVID-19 declaration under Section 56 4(b)(1) of the Act, 21 U.S.C. section 360bbb-3(b)(1), unless the authorization is terminated or revoked sooner. Performed at [facility], [place] [address] [place], [place] [address]   Respiratory Panel by PCR     Status: None   Collection Time: 09/20/19  8:03 PM   Specimen: Nasopharyngeal Swab; Respiratory  Result Value Ref Range Status   Adenovirus NOT DETECTED NOT DETECTED Final   Coronavirus 229E NOT DETECTED NOT DETECTED Final    Comment: (NOTE) The Coronavirus on the Respiratory Panel, DOES NOT test for the novel  Coronavirus (2019 nCoV)    Coronavirus HKU1 NOT DETECTED NOT DETECTED Final   Coronavirus NL63 NOT DETECTED NOT DETECTED Final   Coronavirus OC43 NOT DETECTED NOT DETECTED Final   Metapneumovirus NOT DETECTED NOT DETECTED Final   Rhinovirus / Enterovirus NOT DETECTED NOT DETECTED Final   Influenza A NOT DETECTED NOT DETECTED Final   Influenza B NOT DETECTED NOT DETECTED Final   Parainfluenza Virus 1 NOT DETECTED NOT DETECTED Final   Parainfluenza Virus 2 NOT DETECTED NOT DETECTED Final   Parainfluenza Virus 3 NOT DETECTED NOT DETECTED Final   Parainfluenza Virus 4 NOT DETECTED NOT DETECTED Final   Respiratory Syncytial Virus NOT DETECTED NOT DETECTED Final   Bordetella pertussis NOT DETECTED NOT DETECTED Final   Chlamydophila pneumoniae NOT DETECTED NOT DETECTED Final   Mycoplasma pneumoniae NOT DETECTED NOT DETECTED Final    Comment: Performed at [facility], [place]. [address] [place], [place] [address]  Culture, sputum-assessment     Status: None   Collection Time: 09/20/19 11:02 PM   Specimen: Expectorated Sputum  Result Value Ref Range Status   Specimen Description EXPECTORATED SPUTUM  Final   Special Requests NONE  Final   Sputum evaluation   Final    THIS SPECIMEN IS ACCEPTABLE FOR SPUTUM CULTURE Performed at [facility], [place] [address] [place], [place] [address]    Report Status 09/21/2019 FINAL  Final  Culture, respiratory     Status: None (Preliminary result)   Collection Time: 09/20/19 11:02 PM  Result Value Ref Range Status   Specimen Description   Final    EXPECTORATED SPUTUM Performed at [place] [address], [place], [place] [address]    Special Requests   Final    NONE Reflexed from [id number] Performed at [facility], [place] [address], [place], [place] [address]    Gram Stain   Final    FEW WBC PRESENT, PREDOMINANTLY PMN RARE GRAM POSITIVE COCCI Performed at [facility], [place] [address], [place], [place] [address]    Culture PENDING  Incomplete   Report Status PENDING  Incomplete  MRSA PCR Screening     Status: None   Collection Time: 09/21/19  2:20 AM   Specimen: Nasal Mucosa; Nasopharyngeal  Result Value Ref Range Status   MRSA by PCR NEGATIVE NEGATIVE Final    Comment:        The GeneXpert MRSA Assay (FDA approved for NASAL specimens only),  is one component of a comprehensive MRSA colonization surveillance program. It is not intended to diagnose MRSA infection nor to guide or monitor treatment for MRSA infections. Performed at [facility], [place] [address], [place], [place] [address]     Radiology Reports CT CHEST WO CONTRAST  Result Date: 09/21/2019 CLINICAL DATA:  Recurrence pneumonia. Low oxygen saturation. EXAM: CT CHEST WITHOUT CONTRAST TECHNIQUE: Multidetector CT imaging of the chest was performed following the standard protocol without IV contrast. COMPARISON:  March 26, 2011. FINDINGS: Cardiovascular: Moderate cardiomegaly. Coarse mitral annulus, aortic valvular and four-vessel coronary calcification. Pulmonary artery hypertension, the pulmonary trunk measuring 3.3 cm diameter. Central pulmonary vascular congestion. Mediastinum/Nodes: Nonspecific enlarged right paratracheal  noncalcified lymph node measuring 18 by 22 mm. Additional abnormal number of noncalcified clustered mediastinal subcentimeter short axis diameter lymph nodes. Pericardial recess fluid. Lungs/Pleura: Diffuse reticulonodular interlobular septal thickening with patchy airspace and small airways disease throughout both lungs, commonly seen in the setting of ARDS or pulmonary edema or nonspecific interstitial pneumonitis or COVID infection. Trace bilateral pleural fluid. Upper Abdomen: Mild hepatomegaly. Coarse thoracoabdominal aorta and major abdominal artery calcified atherosclerosis. A nonspecific 7 mm diameter lymph node in the left upper abdominal quadrant ventral to the left hemiliver. Partial characterization of a 15 mm 30 Hounsfield unit left adrenal nodule. No apparent abnormality of the right adrenal gland. Musculoskeletal: Mild chest wall anasarca. Mild skeletal degenerative changes. IMPRESSION: 1. Diffuse interlobular septal thickening with patchy airspace and small airways disease throughout both lungs, commonly seen in the setting of ARDS or pulmonary edema or nonspecific interstitial pneumonitis or COVID infection. 2. Trace bilateral pleural fluid. 3. Cardiomegaly and evidence of pulmonary artery hypertension. 4. Mediastinal nonspecific adenopathy and a left upper abdominal quadrant small lymph node. 5. Thoracoabdominal aorta calcified atherosclerosis and severe 4-vessel coronary calcification. 6. Partial characterization of a 15 mm 30 Hounsfield unit left adrenal nodule. Further characterization with MR abdomen adrenal protocol recommended in 3-6 months. 7. Hepatomegaly. Aortic Atherosclerosis (ICD10-I70.0). Electronically Signed   By: Revonda Humphrey   On: 09/21/2019 00:33   DG Chest Port 1 View  Result Date: 09/20/2019 CLINICAL DATA:  Shortness of breath EXAM: PORTABLE CHEST 1 VIEW COMPARISON:  September 14, 2019 FINDINGS: Diffuse bilateral airspace opacities are again noted. The heart size is stable but  enlarged. Aortic calcifications are noted. There are probable bilateral pleural effusions. There is no acute osseous abnormality. IMPRESSION: Interval worsening of bilateral airspace opacities, otherwise no significant interval change. Electronically Signed   By: Constance Holster M.D.   On: 09/20/2019 19:00   DG Chest Port 1 View  Result Date: 09/14/2019 CLINICAL DATA:  Shortness of breath, cough, congestion EXAM: PORTABLE CHEST 1 VIEW COMPARISON:  05/21/2019 FINDINGS: Bilateral patchy airspace disease noted concerning for pneumonia. Heart is borderline in size. Possible small effusions. No acute bony abnormality. IMPRESSION: Patchy bilateral airspace opacities concerning for pneumonia. Suspect small bilateral effusions. Electronically Signed   By: Rolm Baptise M.D.   On: 09/14/2019 08:17     Time Spent in minutes  30     Desiree Hane M.D on 09/21/2019 at 7:21 AM  To page go to [email or web address] - password [facility]

## 2019-09-21 NOTE — Progress Notes (Signed)
OT Cancellation Note  Patient Details Name: Patricia Mata MRN: [id number] DOB: 1955-10-25   Cancelled Treatment:    Reason Eval/Treat Not Completed: Medical issues which prohibited therapy. Patient is having a decline in respiratory status requiring non-rebreather with O2 stats in the 80s. Will attempt OT eval on different date and time.   Bryce Kimble 09/21/2019, 11:38 AM

## 2019-09-21 NOTE — Progress Notes (Signed)
NAME:  Patricia Mata, MRN:  [id number], DOB:  12-15-1955, LOS: 1 ADMISSION DATE:  09/20/2019, CONSULTATION DATE:  09/20/19 REFERRING MD:  Roel Cluck, CHIEF COMPLAINT:  Pneumonia  Brief History   This is a 64 yo with history of IDDM, HTN, CKD, Pulm HTN, gout, colon cancer who presents for recurrence of pna like symptoms after treatment for CAP for 5 days on previous admisison from 3/02-3/05.   History of present illness   This is a 64 yo with history as noted above with history of IDDM, HTN, CKD, Pulm HTN, gout, colon cancer who presents for recurrence of pna like symptoms after treatment for CAP for 5 days on previous admisison from 3/02-3/05. Today presenting with recurrence of pna like symptoms. Increased cough that is green in color. Patient with increased WOB as well. Felt it was getting harder to breath. No increase in weight. No lower extremity edema. Has been taking lasix and sildenafil daily. No fevers but is feeling worse. Smoked for about 15 pack years 20 years ago. No travels recently. Has not lived in [place] country for more than 6 months. Has lived here in [place] whole life. 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 No tb risk factor including homelessness, IVDU or incarceration. Has a dog that lives with her. No birds.   Past Medical History  CAP Anemia in CKD Essential HNT HLD T2DM Hypoxia  Significant Hospital Events   Patient being admitted to stepdown  Consults:  PCCM  Procedures:  NA Significant Diagnostic Tests:  CT chest 3/8 > ARDS, air bronchograms c/w PNA, trace effusions, cardiomegaly, left adrenal nodule, hepatomegaly. Echocardiogram   Micro Data:  Sputum 3/8 > RVP 3/8 > neg. COVID 3/8 > neg.  Antimicrobials:  Vancomycin Cefepime  Interim history/subjective:  No acute events.  On 100% HFNC at 45L + NRB. CT with ARDS + PNA.   Complains of severe cough which is not letting her sleep.  Also causes desats during coughing spells.  Objective    Blood pressure (!) 128/29, pulse 82, temperature 97.7 F (36.5 C), temperature source Oral, resp. rate (!) 22, height 5\' 4"  (1.626 m), weight 81.6 kg, SpO2 94 %.    FiO2 (%):  [100 %] 100 %   Intake/Output Summary (Last 24 hours) at 09/21/2019 0723 Last data filed at 09/21/2019 0600 Gross per 24 hour  Intake 447.75 ml  Output 1200 ml  Net -752.25 ml   Filed Weights   09/20/19 1741  Weight: 81.6 kg    Examination: General: Adult female, resting in bed, in NAD. Neuro: A&O x 3, no deficits. HEENT: [place]/AT. Sclerae anicteric. EOMI.  On HFNC + NRB. Cardiovascular: RRR, no M/R/G.  Lungs: Respirations mildly labored.  Crackles bilaterally. Abdomen: BS x 4, soft, NT/ND.  Musculoskeletal: No gross deformities, no edema.  Skin: Intact, warm, no rashes.   Assessment & Plan:   HAP-Patient has failed CAP coverage ans was just hospitalized on 09/14/19. Given this would classify this as HAP.  Did get treatment for atypical with azithromycin during recent hospitalization.  Initially some concern for ILD / UIP; however, CT chest not c/w this and more favorable for multifocal PNA + ARDS.  Of note patiuet was admitted for COVID on 03/18/19, had normal pft's on 03/14/19 with normal TLC and FEV/FVC after that was normal but did have decreased DLCO.  - Continue supplemental O2 as needed to maintain SpO2 > 92%. - Encourage self proning if able. - Continue empiric vanc and  cefepime. - Follow cultures. - If no improvement, then can consider steroids but will defer for now.  Severe cough. - Start hycodan, tessalon perles, chest PT.  Pulm HTN-Appears to be stable . Most recent right heart cath on 04/23/19 Noted PA pressure of 57/12(32) capillary wedge was 14 which was much improved from right heart cath in 2019 which noted sever PH with mean of 62 (105/40) cappillary wedge of 25. - Agree with echo with bubble study for now - Continue home meds of sildenafil and continue home diuretic. Not overtly overloaded  at this time BNP only 500 previously in the 1000's  Left adrenal nodule. - F/u as outpatient with MRI at some point down the road.   Best practice:  Diet: Npo for now Pain/Anxiety/Delirium protocol (if indicated): NA VAP protocol (if indicated): NA DVT prophylaxis: Per primary GI prophylaxis: Per primary Mobility: TBD Code Status: Full code would want intubation and CPR  if should be needed Family Communication: Talked to patient. She will discuss with husband Disposition: Step down unit   CC time: 35 min.   Montey Hora, [facility] 09/21/2019, 7:50 AM

## 2019-09-21 NOTE — Progress Notes (Signed)
Pt tachypneic and labored with spo2 80% on hfnc and nrb.  Cpt held at this time.  RN aware.

## 2019-09-21 NOTE — Plan of Care (Signed)
Called to patient bedside. She is hypoxic and with increase of breathing sats in 60's persistently. Called family and spoke with patient. They are okay with intubation. Will proceed with intubation.   Newell Coral DO Internal Medicine/Pediatrics Pulmonary and Critical Care Fellow PGY-6

## 2019-09-21 NOTE — Consult Note (Addendum)
Cardiology Consultation:   Patient ID: Patricia Mata MRN: [id number]; DOB: [birth year]  Admit date: 09/20/2019 Date of Consult: 09/21/2019  Primary Care Provider: [name], [place] Primary Cardiologist: Quay Burow, MD  Primary Electrophysiologist:  None    Patient Profile:   Patricia Mata is a 64 y.o. 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 Lonny Prude.  History of Present Illness:   Patricia Mata was admitted 05/10/18 with Afib RVR and recurrent anemia. Unfortunately, she decompensated and was intubated. IV amiodarone started and required CVVHD. She converted to NSR and amiodarone was weaned off. Echo at that time showed EF of 65-70% with moderate AS and moderate MS, severe pulmonary hypertension with RVSP 93 mmHg. She was deemed not a candidate for AS or MS intervention with advanced PAH and transfusion-dependent myelodysplasia s/ colon cancer. Right and left heart cath 05/2018 with normal coronaries and severely elevated PA pressures. V/Q scan was negative for PE. She was started on sildenafil and diuresed with 40 mg PO lasix BID. She was made DNR.   She requires transfusions every 2-3 weeks as OP with Dr. Alen Blew. She was admitted to [facility] for COVID-19 infection 03/2019. She had normal PFTs on 03/14/19 but with reduced DLCO.  Repeat right heart cath 04/23/19 with mild PAH in the setting of high-output, thought to be due to anemia +/- OSA. She follows with Dr. Haroldine Laws and was last seen 05/21/19. She continued to have a productive cough at that time and LUL opacity, started ondoxy 100 mg BID x 7 days.  In summary: - echo 05/12/18 with EF 65-70%, grade 2 DD, mod AS, mild-mod MS, PA peak pressure 93 - left and right heart cath 05/21/18: no  significant CAD, severe pulmonary hypertension PA 105/40, PCWP 33 - suspected high output due to anemia and OSA - repeat RHC 04/23/19 with improved pulmonary pressures with PA 57/12, PCWP 14 - echo 8/20 with EF 65% and normal RV size and function   PT was recently admitted 3/2-3/5 for PNA, treated with azithromycine. Pt returned for increased SOB and WOB with suspicion for failure of CAP treatment, thought to have HCAP.   CT chest with multifocal PNA, appeared consistent with ARDS or pulmonary edema or interstitial pneumonitis. Also noted thoracoabdominal aorta atherosclerosis and severe 4v coronary calcification.  On arrival, she was afebrile, tachypneic with O2 sat 82% requiring 15L O2. WBC 27, Hb 7.9 (near her baseline), and hs troponin 62 --> 215. COVID negative. Acute on chronic kidney injury with sCr 2.55, BNP 548, and elevated D-dimer 2.53. Procalcitonin 2.18.     Cardiology was asked to consult for possible CHF exacerbation.     Heart Pathway Score:     Past Medical History:  Diagnosis Date   Acute kidney injury (nontraumatic) ([place])    Acute renal failure (ARF) ([place]) 06/08/2018   Cancer ([place]) 2008   Colon    Diabetes mellitus without complication ([place])    Gout 09/08/2018   Hypertension    Macrocytic anemia     Past Surgical History:  Procedure Laterality Date   COLON SURGERY     DEBRIDMENT OF DECUBITUS ULCER N/A 06/12/2018   Procedure: DEBRIDMENT OF DECUBITUS ULCER;  Surgeon: Wallace Going, DO;  Location: [facility];  Service: Plastics;  Laterality: N/A;   IR FLUORO GUIDE PORT INSERTION RIGHT  04/02/2017   IR REMOVAL TUN ACCESS W/ PORT W/O FL MOD SED  03/16/2019   IR US GUIDE VASC ACCESS RIGHT  04/02/2017   RIGHT HEART CATH N/A 04/23/2019   Procedure: RIGHT HEART CATH;  Surgeon: Jolaine Artist, MD;  Location: [place] [facility];  Service: Cardiovascular;  Laterality: N/A;   RIGHT/LEFT HEART CATH AND CORONARY ANGIOGRAPHY N/A 05/21/2018   Procedure: RIGHT/LEFT HEART CATH  AND CORONARY ANGIOGRAPHY;  Surgeon: Nelva Bush, MD;  Location: [place] [facility];  Service: Cardiovascular;  Laterality: N/A;     Home Medications:  Prior to Admission medications   Medication Sig Start Date End Date Taking? Authorizing Provider  allopurinol (ZYLOPRIM) 100 MG tablet Take 100 mg by mouth daily. 05/07/19  Yes [provider]  amiodarone (PACERONE) 200 MG tablet TAKE 1 TABLET BY MOUTH EVERY DAY Patient taking differently: Take 200 mg by mouth daily.  08/02/19  Yes Bensimhon, Shaune Pascal, MD  amLODipine (NORVASC) 10 MG tablet Take 1 tablet (10 mg total) by mouth daily. 02/15/19  Yes Bensimhon, Shaune Pascal, MD  atorvastatin (LIPITOR) 40 MG tablet TAKE 1 TABLET BY MOUTH EVERY DAY Patient taking differently: Take 40 mg by mouth daily at 6 PM.  08/21/19  Yes [name], FNP  cefdinir (OMNICEF) 300 MG capsule Take 1 capsule (300 mg total) by mouth daily for 4 days. 09/17/19 09/21/19 Yes Pokhrel, Laxman, MD  colchicine 0.6 MG tablet TAKE 1 TABLET BY MOUTH TWICE A DAY AS NEEDED FOR GOUT Patient taking differently: Take 0.6 mg by mouth daily.  09/01/19  Yes [name], FNP  doxazosin (CARDURA) 1 MG tablet Take 1 tablet (1 mg total) by mouth at bedtime. 05/21/19  Yes Bensimhon, Shaune Pascal, MD  doxycycline (VIBRA-TABS) 100 MG tablet Take 1 tablet (100 mg total) by mouth 2 (two) times daily for 4 days. 09/17/19 09/21/19 Yes Pokhrel, Laxman, MD  furosemide (LASIX) 40 MG tablet TAKE 1 TABLET BY MOUTH EVERY DAY Patient taking differently: Take 40 mg by mouth daily.  05/12/19  Yes [name], FNP  hydrALAZINE (APRESOLINE) 100 MG tablet Take 1 tablet (100 mg total) by mouth 3 (three) times daily. 11/20/18  Yes Lorretta Harp, MD  insulin glargine (LANTUS) 100 unit/mL SOPN Inject 0.15 mLs (15 Units total) into the skin daily. Patient taking differently: Inject 5 Units into the skin 2 (two) times daily as needed (blood sugar over 150).  03/23/19  Yes Patrecia Pour, MD  JANUVIA 50 MG tablet TAKE 1  TABLET BY MOUTH EVERY DAY Patient taking differently: Take 50 mg by mouth daily.  07/12/19  Yes [name], FNP  latanoprost (XALATAN) 0.005 % ophthalmic solution Place 1 drop into both eyes at bedtime.    Yes [provider]  levothyroxine (SYNTHROID) 88 MCG tablet Take 1 tablet (88 mcg total) by mouth daily. 07/15/19 07/14/20 Yes [name], FNP  Multiple Vitamins-Minerals (CENTRUM SILVER 50+WOMEN) TABS Take 1 tablet by mouth daily.   Yes [provider]  oxymetazoline (AFRIN) 0.05 % nasal spray Place 1 spray into both nostrils 2 (two) times daily as needed for congestion.   Yes [provider]  sildenafil (REVATIO) 20 MG tablet Take 1 tablet (20 mg total) by mouth 3 (three) times daily. 02/25/19  Yes Clegg, Amy D, NP  blood glucose meter kit and supplies Dispense based on patient and insurance preference. Use up to four times daily as directed. (FOR ICD-10  E10.9, E11.9). 11/04/18   [name], FNP  Blood Glucose Monitoring Suppl [facility] Karsten Fells) w/Device KIT Check blood sugars twice daily E11.9 07/15/19   [name], FNP  glucose blood test strip Use as instructed 07/22/19   [name], FNP  guaiFENesin-dextromethorphan (ROBITUSSIN DM) 100-10 MG/5ML syrup Take 5 mLs by mouth every 4 (four) hours as needed for cough. 09/17/19   Flora Lipps, MD  Insulin Pen Needle 29G X 5MM MISC Use as directed 06/16/17   Bonnielee Haff, MD  Lancets [facility] ULTRASOFT) lancets Use as instructed to check blood sugars twice daily E11.9 07/15/19   [name], FNP    Inpatient Medications: Scheduled Meds:  allopurinol  100 mg Oral Daily   amiodarone  200 mg Oral Daily   atorvastatin  40 mg Oral q1800   benzonatate  200 mg Oral TID   Chlorhexidine Gluconate Cloth  6 each Topical Q0600   furosemide  40 mg Intravenous Once   furosemide  40 mg Oral Daily   heparin injection (subcutaneous)  5,000 Units Subcutaneous Q8H   insulin aspart  0-9 Units Subcutaneous Q4H    insulin glargine  5 Units Subcutaneous Q2200   latanoprost  1 drop Both Eyes QHS   levothyroxine  88 mcg Oral Q0600   mouth rinse  15 mL Mouth Rinse BID   sildenafil  20 mg Oral TID   sodium chloride flush  3 mL Intravenous Q12H   Continuous Infusions:  sodium chloride Stopped (09/21/19 0756)   sodium chloride     ceFEPime (MAXIPIME) IV     [START ON 09/22/2019] vancomycin     PRN Meds: sodium chloride, sodium chloride, acetaminophen **OR** acetaminophen, HYDROcodone-homatropine, HYDROcodone-homatropine, Melatonin, morphine injection, ondansetron **OR** ondansetron (ZOFRAN) IV, sodium chloride flush  Allergies:    Allergies  Allergen Reactions   Ancef [Cefazolin] Itching    Severe itching- after procedure, ancef was the antibiotic.-04/02/17 Tolerates penicillins   Sulfa Antibiotics Rash and Other (See Comments)    Blisters, also    Social History:   Social History   Socioeconomic History   Marital status: Married    Spouse name: Not on file   Number of children: Not on file   Years of education: Not on file   Highest education level: Not on file  Occupational History   Not on file  Tobacco Use   Smoking status: Former Smoker    Packs/day: 0.25    Years: 20.00    Pack years: 5.00    Types: Cigarettes    Quit date: 01/31/2001    Years since quitting: 18.6   Smokeless tobacco: Former Systems developer  Substance and Sexual Activity   Alcohol use: Yes    Alcohol/week: 2.0 standard drinks    Types: 2 Shots of liquor per week   Drug use: Never   Sexual activity: Not on file  Other Topics Concern   Not on file  Social History Narrative   Not on file   Social Determinants of Health   Financial Resource Strain:    Difficulty of Paying Living Expenses: Not on file  Food Insecurity:    Worried About [place] in the Last Year: Not on file   Ran Out of Food in the Last Year: Not on file  Transportation Needs:    Lack of Transportation (Medical): Not on file   Lack of  Transportation (Non-Medical): Not on file  Physical Activity:    Days of Exercise per Week: Not on file   Minutes of  Exercise per Session: Not on file  Stress:    Feeling of Stress : Not on file  Social Connections:    Frequency of Communication with Friends and Family: Not on file   Frequency of Social Gatherings with Friends and Family: Not on file   Attends Religious Services: Not on file   Active Member of Clubs or Organizations: Not on file   Attends Archivist Meetings: Not on file   Marital Status: Not on file  Intimate Partner Violence:    Fear of Current or Ex-Partner: Not on file   Emotionally Abused: Not on file   Physically Abused: Not on file   Sexually Abused: Not on file    Family History:    Family History  Problem Relation Age of Onset   Hypertension Mother    Diabetes Mother    Cervical cancer Mother    Heart attack Father    Hypertension Sister    Hypertension Brother    Hypertension Sister    Hypertension Sister    Prostate cancer Brother    HIV/AIDS Brother      ROS:  Please see the history of present illness.   All other ROS reviewed and negative.     Physical Exam/Data:   Vitals:   09/21/19 0620 09/21/19 0700 09/21/19 0800 09/21/19 0847  BP:  (!) 146/107 (!) 145/42   Pulse:   84 85  Resp: (!) 22 (!) 26 (!) 24 20  Temp:   98.5 F (36.9 C)   TempSrc:   Oral   SpO2: 94%  (!) 88% 95%  Weight:      Height:        Intake/Output Summary (Last 24 hours) at 09/21/2019 1014 Last data filed at 09/21/2019 0811 Gross per 24 hour  Intake 524.12 ml  Output 1200 ml  Net -675.88 ml   Last 3 Weights 09/20/2019 09/17/2019 09/14/2019  Weight (lbs) 180 lb 180 lb 14.4 oz 181 lb 14.1 oz  Weight (kg) 81.647 kg 82.056 kg 82.5 kg     Body mass index is 30.9 kg/m.  General:  Mildly obese female HEENT: normal Neck: no JVD Vascular: No carotid bruits Cardiac:  Positive systolic murmur on exam Lungs:  Coarse sounds throughout, cough Abd: soft,  nontender, no hepatomegaly  Ext: no edema Musculoskeletal:  No deformities, BUE and BLE strength normal and equal Skin: warm and dry  Neuro:  CNs 2-12 intact, no focal abnormalities noted Psych:  Normal affect   EKG:  The EKG was personally reviewed and demonstrates:  Sinus rhythm, HR 79 Telemetry:  Telemetry was personally reviewed and demonstrates:  Sinus rhythm in the 70-80s, low voltage p waves  Relevant CV Studies:  RHC 04/23/19 showed mild PAH in setting of high-output (likely due to anemia) RA = 8 RV = 53/3 PA = 57/12 (32) PCW = 14 (v waves to 32) Fick cardiac output/index = 8.4/4.5 PVR = 2.1 WU Ao sat = 99% PA sat = 73%, 74% High SVC sat = 74%  Laboratory Data:  High Sensitivity Troponin:   Recent Labs  Lab 09/20/19 1823 09/20/19 2302 09/21/19 0828  TROPONINIHS 62* 215* 254*     Chemistry Recent Labs  Lab 09/17/19 0501 09/20/19 1823 09/21/19 0300  NA 137 138 138  K 3.7 4.5 4.0  CL 105 106 108  CO2 21* 19* 22  GLUCOSE 173* 330* 186*  BUN 45* 65* 62*  CREATININE 1.80* 2.55* 2.26*  CALCIUM 9.0 8.9 8.9  GFRNONAA 29* 19*  22*  GFRAA 34* 22* 26*  ANIONGAP _0 Recent Labs  Lab 09/20/19 1823 09/21/19 0300  PROT 7.1 7.1  ALBUMIN 2.9* 2.7*  AST 32 29  ALT 24 19  ALKPHOS 147* 135*  BILITOT 0.9 1.2   Hematology Recent Labs  Lab 09/17/19 0501 09/17/19 0501 09/20/19 1823 09/21/19 0056 09/21/19 0300  WBC 14.6*  --  27.2*  --  27.0*  RBC 2.79*   < > 2.56* 2.55* 2.64*  HGB 8.3*  --  7.7*  --  7.9*  HCT 25.6*  --  23.4*  --  24.4*  MCV 91.8  --  91.4  --  92.4  MCH 29.7  --  30.1  --  29.9  MCHC 32.4  --  32.9  --  32.4  RDW 20.4*  --  21.5*  --  21.4*  PLT 238  --  285  --  281   < > = values in this interval not displayed.   BNP Recent Labs  Lab 09/20/19 1823  BNP 548.2*    DDimer  Recent Labs  Lab 09/20/19 1823  DDIMER 2.53*     Radiology/Studies:  CT CHEST WO CONTRAST  Result Date: 09/21/2019 CLINICAL DATA:  Recurrence  pneumonia. Low oxygen saturation. EXAM: CT CHEST WITHOUT CONTRAST TECHNIQUE: Multidetector CT imaging of the chest was performed following the standard protocol without IV contrast. COMPARISON:  March 26, 2011. FINDINGS: Cardiovascular: Moderate cardiomegaly. Coarse mitral annulus, aortic valvular and four-vessel coronary calcification. Pulmonary artery hypertension, the pulmonary trunk measuring 3.3 cm diameter. Central pulmonary vascular congestion. Mediastinum/Nodes: Nonspecific enlarged right paratracheal noncalcified lymph node measuring 18 by 22 mm. Additional abnormal number of noncalcified clustered mediastinal subcentimeter short axis diameter lymph nodes. Pericardial recess fluid. Lungs/Pleura: Diffuse reticulonodular interlobular septal thickening with patchy airspace and small airways disease throughout both lungs, commonly seen in the setting of ARDS or pulmonary edema or nonspecific interstitial pneumonitis or COVID infection. Trace bilateral pleural fluid. Upper Abdomen: Mild hepatomegaly. Coarse thoracoabdominal aorta and major abdominal artery calcified atherosclerosis. A nonspecific 7 mm diameter lymph node in the left upper abdominal quadrant ventral to the left hemiliver. Partial characterization of a 15 mm 30 Hounsfield unit left adrenal nodule. No apparent abnormality of the right adrenal gland. Musculoskeletal: Mild chest wall anasarca. Mild skeletal degenerative changes. IMPRESSION: 1. Diffuse interlobular septal thickening with patchy airspace and small airways disease throughout both lungs, commonly seen in the setting of ARDS or pulmonary edema or nonspecific interstitial pneumonitis or COVID infection. 2. Trace bilateral pleural fluid. 3. Cardiomegaly and evidence of pulmonary artery hypertension. 4. Mediastinal nonspecific adenopathy and a left upper abdominal quadrant small lymph node. 5. Thoracoabdominal aorta calcified atherosclerosis and severe 4-vessel coronary calcification. 6.  Partial characterization of a 15 mm 30 Hounsfield unit left adrenal nodule. Further characterization with MR abdomen adrenal protocol recommended in 3-6 months. 7. Hepatomegaly. Aortic Atherosclerosis (ICD10-I70.0). Electronically Signed   By: Revonda Humphrey   On: 09/21/2019 00:33   DG Chest Port 1 View  Result Date: 09/20/2019 CLINICAL DATA:  Shortness of breath EXAM: PORTABLE CHEST 1 VIEW COMPARISON:  September 14, 2019 FINDINGS: Diffuse bilateral airspace opacities are again noted. The heart size is stable but enlarged. Aortic calcifications are noted. There are probable bilateral pleural effusions. There is no acute osseous abnormality. IMPRESSION: Interval worsening of bilateral airspace opacities, otherwise no significant interval change. Electronically Signed   By: Constance Holster M.D.   On: 09/20/2019 19:00  TIMI Risk Score for Unstable Angina or Non-ST Elevation MI:   The patient's TIMI risk score is 3, which indicates a 13% risk of all cause mortality, new or recurrent myocardial infarction or need for urgent revascularization in the next 14 days.   Assessment and Plan:   Pulmonary hypertension Acute on chronic diastolic heart failure Hx of COVID PNA 03/2019, CAP 09/2019 Leukocytosis, afebrile - treated with sildenafil 20 mg TID and lasix 40 mg daily - CT chest with multifocal PNA and ARDS picture - on vanc and cefepime - BNP 548 - WBC 27 - agree with diuresis give the above and her valvular disease   Hypertension - hydralazine, doxazosin    Hyperlipidemia Aortic atherosclerosis Coronary calcification on CT Elevated troponin - hs troponin 62 --> 215 - although she does have calcification in her coronaries on CT scan, this may represent demand ischemia in the setting of sepsis - continue statin - pt DNR, no ischemic evaluation   AS/MS, moderate TR - not a candidate for valve replacement/repair, or TAVR - can be a contributing factor in her dyspnea    Atrial  fibrillation - no anticoagulation given chronic anemia requiring recurrent transfusions, secondary to MDS - has been on amiodarone - amiodarone may not be a favorable option for her at this point given her pulmonary status, although imaging does not appear consistent with amiodarone toxicity - may stop amiodarone   CKD stag III - baseline creatinine 1.8-2.4 - sCr this admission  - per neprhology - complicates diuresis         For questions or updates, please contact [facility] Please consult [email or web address] for contact info under     Signed, Ledora Bottcher, PA  09/21/2019 10:14 AM  Patient examined chart reviewed. Discussed care with patient, nurse, pulmonary critical care and PA. Exam with chronically ill black female. Respiratory distress with tachypnea and sats as low as 85% Sinus tachycardia on telemetry AS murmur on exam but S2 preserved Diffuse crackles on exam. Abdomen soft trace edema. Reviewed echo done today. Bubble study negative for right to left shunting. AS with mean gradient 42 mmHg but visible valve opening. Severe MAC with gradient in diastole but MVA by PT 1/2 2.8 cm2 mild MR and moderate TR. RV is not enlarged and has normal function Moderate TR with estimated PA pressures in mid 50's at her baseline. Clinical picture more consistent with ARDS/ recurrent pneumonia. MSO4 for comfort. Lasix as needed to keep on dry side wrote for 40 mg iv follow Cr  Ok to d/c amiodarone given pulmonary status She is not a candida for TAVR or valve intervention due to co morbidities and need for chronic transfusions   Plan per pulmonary Admit note indicates full CODE but mention elsewhere in chart DNR Palliative consult and goals of care appropriate to address Cardiology has nothing to offer at this point   Jenkins Rouge MD [facility]

## 2019-09-21 NOTE — Progress Notes (Signed)
Pharmacy Antibiotic Note  Patricia Mata is a 64 y.o. female admitted on 09/20/2019 with sepsis.  Pharmacy has been consulted for Vancomycin, cefepime dosing.  Plan: Vancomycin 1.5gm iv x1, then Vancomycin 1000 mg IV Q 36 hrs. Goal AUC 400-550. Expected AUC: 506 SCr used: 2.26  Cefepime 2gm iv x1, then 2gm iv q24hr   Height: 5\' 4"  (162.6 cm) Weight: 180 lb (81.6 kg) IBW/kg (Calculated) : 54.7  Temp (24hrs), Avg:98.1 F (36.7 C), Min:97.6 F (36.4 C), Max:98.6 F (37 C)  Recent Labs  Lab 09/14/19 0736 09/14/19 1551 09/15/19 0334 09/16/19 0328 09/17/19 0501 09/20/19 1823 09/21/19 0056 09/21/19 0300  WBC   < >  --  17.4* 14.7* 14.6* 27.2*  --  27.0*  CREATININE  --  1.89*  --  1.58* 1.80* 2.55*  --  2.26*  LATICACIDVEN  --   --   --   --   --  2.3* 1.0 1.2   < > = values in this interval not displayed.    Estimated Creatinine Clearance: 26 mL/min (A) (by C-G formula based on SCr of 2.26 mg/dL (H)).    Allergies  Allergen Reactions  . Ancef [Cefazolin] Itching    Severe itching- after procedure, ancef was the antibiotic.-04/02/17 Tolerates penicillins  . Sulfa Antibiotics Rash and Other (See Comments)    Blisters, also    Antimicrobials this admission: Vancomycin 09/21/2019 >> Cefepime 09/20/2019 >>   Dose adjustments this admission: -  Microbiology results: -  Thank you for allowing pharmacy to be a part of this patient's care.  Nani Skillern Crowford 09/21/2019 4:12 AM

## 2019-09-21 NOTE — Progress Notes (Signed)
[facility] [place] [facility] Progress Note Patient Name: Patricia Mata DOB: 15-Feb-1956 MRN: [id number]   Date of Service  09/21/2019  HPI/Events of Note  Mg++ = 1.6 and Creatinine = 2.26.   eICU Interventions  Will replace Mg++.     Intervention Category Major Interventions: Electrolyte abnormality - evaluation and management  [place] Ferch Eugene 09/21/2019, 6:52 AM

## 2019-09-21 NOTE — Progress Notes (Signed)
CRITICAL VALUE ALERT  Critical Value:  Troponin 254  Date & Time Notied:  09/21/19 1003  Provider Notified: Lisbeth Ply, MD   Orders Received/Actions taken: awaiting new orders

## 2019-09-21 NOTE — Progress Notes (Signed)
PT Cancellation Note  Patient Details Name: Patricia Mata MRN: [id number] DOB: January 15, 1956   Cancelled Treatment:    Reason Eval/Treat Not Completed: Medical issues which prohibited therapy. RECENTLY DC'd from Hospital, now returns with respiratory distress and  Currently on HFNC. Will check back another time when medically stable.    Claretha Cooper 09/21/2019, 1:09 PM  [facility] Pager [phone] Office [phone]

## 2019-09-22 ENCOUNTER — Inpatient Hospital Stay (HOSPITAL_COMMUNITY): Payer: PPO

## 2019-09-22 ENCOUNTER — Other Ambulatory Visit: Payer: Self-pay

## 2019-09-22 DIAGNOSIS — I4891 Unspecified atrial fibrillation: Secondary | ICD-10-CM

## 2019-09-22 DIAGNOSIS — I5031 Acute diastolic (congestive) heart failure: Secondary | ICD-10-CM

## 2019-09-22 DIAGNOSIS — I350 Nonrheumatic aortic (valve) stenosis: Secondary | ICD-10-CM

## 2019-09-22 LAB — GLUCOSE, CAPILLARY
Glucose-Capillary: 108 mg/dL — ABNORMAL HIGH (ref 70–99)
Glucose-Capillary: 119 mg/dL — ABNORMAL HIGH (ref 70–99)
Glucose-Capillary: 204 mg/dL — ABNORMAL HIGH (ref 70–99)
Glucose-Capillary: 208 mg/dL — ABNORMAL HIGH (ref 70–99)
Glucose-Capillary: 234 mg/dL — ABNORMAL HIGH (ref 70–99)
Glucose-Capillary: 255 mg/dL — ABNORMAL HIGH (ref 70–99)

## 2019-09-22 LAB — BLOOD GAS, ARTERIAL
Acid-base deficit: 6 mmol/L — ABNORMAL HIGH (ref 0.0–2.0)
Acid-base deficit: 5.6 mmol/L — ABNORMAL HIGH (ref 0.0–2.0)
Acid-base deficit: 5.9 mmol/L — ABNORMAL HIGH (ref 0.0–2.0)
Acid-base deficit: 4.5 mmol/L — ABNORMAL HIGH (ref 0.0–2.0)
Bicarbonate: 23.2 mmol/L (ref 20.0–28.0)
Bicarbonate: 22.7 mmol/L (ref 20.0–28.0)
Bicarbonate: 22.4 mmol/L (ref 20.0–28.0)
Bicarbonate: 20.6 mmol/L (ref 20.0–28.0)
Drawn by: 23281
Drawn by: 331471
FIO2: 100
FIO2: 100
FIO2: 52
FIO2: 55.1
MECHVT: 350 mL
O2 Saturation: 94.8 %
O2 Saturation: 99.2 %
O2 Saturation: 97.5 %
O2 Saturation: 98.4 %
PEEP: 15 cmH2O
PEEP: 15 cmH2O
Patient temperature: 98.8
Patient temperature: 98.6
Patient temperature: 98.2
Patient temperature: 98.6
RATE: 35 resp/min
pCO2 arterial: 76.3 mmHg (ref 32.0–48.0)
pCO2 arterial: 67.2 mmHg (ref 32.0–48.0)
pCO2 arterial: 61.8 mmHg — ABNORMAL HIGH (ref 32.0–48.0)
pCO2 arterial: 41.3 mmHg (ref 32.0–48.0)
pH, Arterial: 7.111 — CL (ref 7.350–7.450)
pH, Arterial: 7.155 — CL (ref 7.350–7.450)
pH, Arterial: 7.182 — CL (ref 7.350–7.450)
pH, Arterial: 7.319 — ABNORMAL LOW (ref 7.350–7.450)
pO2, Arterial: 95.3 mmHg (ref 83.0–108.0)
pO2, Arterial: 157 mmHg — ABNORMAL HIGH (ref 83.0–108.0)
pO2, Arterial: 129 mmHg — ABNORMAL HIGH (ref 83.0–108.0)
pO2, Arterial: 135 mmHg — ABNORMAL HIGH (ref 83.0–108.0)

## 2019-09-22 LAB — CBC WITH DIFFERENTIAL/PLATELET
Abs Immature Granulocytes: 2.07 10*3/uL — ABNORMAL HIGH (ref 0.00–0.07)
Basophils Absolute: 0.1 10*3/uL (ref 0.0–0.1)
Basophils Relative: 1 %
Eosinophils Absolute: 0 10*3/uL (ref 0.0–0.5)
Eosinophils Relative: 0 %
HCT: 22.5 % — ABNORMAL LOW (ref 36.0–46.0)
Hemoglobin: 7 g/dL — ABNORMAL LOW (ref 12.0–15.0)
Immature Granulocytes: 7 %
Lymphocytes Relative: 2 %
Lymphs Abs: 0.5 10*3/uL — ABNORMAL LOW (ref 0.7–4.0)
MCH: 29.7 pg (ref 26.0–34.0)
MCHC: 31.1 g/dL (ref 30.0–36.0)
MCV: 95.3 fL (ref 80.0–100.0)
Monocytes Absolute: 2.6 10*3/uL — ABNORMAL HIGH (ref 0.1–1.0)
Monocytes Relative: 9 %
Neutro Abs: 24.8 10*3/uL — ABNORMAL HIGH (ref 1.7–7.7)
Neutrophils Relative %: 81 %
Platelets: 329 10*3/uL (ref 150–400)
RBC: 2.36 MIL/uL — ABNORMAL LOW (ref 3.87–5.11)
RDW: 21.8 % — ABNORMAL HIGH (ref 11.5–15.5)
WBC: 30.2 10*3/uL — ABNORMAL HIGH (ref 4.0–10.5)
nRBC: 1.9 % — ABNORMAL HIGH (ref 0.0–0.2)

## 2019-09-22 LAB — COMPREHENSIVE METABOLIC PANEL
ALT: 17 U/L (ref 0–44)
AST: 28 U/L (ref 15–41)
Albumin: 2.3 g/dL — ABNORMAL LOW (ref 3.5–5.0)
Alkaline Phosphatase: 114 U/L (ref 38–126)
Anion gap: 9 (ref 5–15)
BUN: 59 mg/dL — ABNORMAL HIGH (ref 8–23)
CO2: 23 mmol/L (ref 22–32)
Calcium: 8.4 mg/dL — ABNORMAL LOW (ref 8.9–10.3)
Chloride: 104 mmol/L (ref 98–111)
Creatinine, Ser: 2.49 mg/dL — ABNORMAL HIGH (ref 0.44–1.00)
GFR calc Af Amer: 23 mL/min — ABNORMAL LOW (ref >60)
GFR calc non Af Amer: 20 mL/min — ABNORMAL LOW (ref >60)
Glucose, Bld: 265 mg/dL — ABNORMAL HIGH (ref 70–99)
Potassium: 4.5 mmol/L (ref 3.5–5.1)
Sodium: 136 mmol/L (ref 135–145)
Total Bilirubin: 1.2 mg/dL (ref 0.3–1.2)
Total Protein: 6.1 g/dL — ABNORMAL LOW (ref 6.5–8.1)

## 2019-09-22 LAB — BODY FLUID CELL COUNT WITH DIFFERENTIAL
Eos, Fluid: 1 %
Lymphs, Fluid: 24 %
Monocyte-Macrophage-Serous Fluid: 21 % — ABNORMAL LOW (ref 50–90)
Neutrophil Count, Fluid: 54 % — ABNORMAL HIGH (ref 0–25)
Total Nucleated Cell Count, Fluid: 207 cu mm (ref 0–1000)

## 2019-09-22 LAB — PHOSPHORUS: Phosphorus: 7 mg/dL — ABNORMAL HIGH (ref 2.5–4.6)

## 2019-09-22 LAB — MAGNESIUM: Magnesium: 1.9 mg/dL (ref 1.7–2.4)

## 2019-09-22 LAB — TRIGLYCERIDES: Triglycerides: 108 mg/dL (ref <150)

## 2019-09-22 MED ORDER — VITAL HIGH PROTEIN PO LIQD: 1000.0000 mL | ORAL | Status: DC

## 2019-09-22 MED ORDER — ORAL CARE MOUTH RINSE
15.0000 mL | OROMUCOSAL | Status: DC
  Administered 2019-09-22 – 2019-09-27 (×45): 15 mL via OROMUCOSAL

## 2019-09-22 MED ORDER — VASOPRESSIN 20 UNIT/ML IV SOLN
0.0300 [IU]/min | INTRAVENOUS | Status: DC
  Administered 2019-09-22: 0.03 [IU]/min via INTRAVENOUS
  Filled 2019-09-22 (×2): qty 2

## 2019-09-22 MED ORDER — ACETAMINOPHEN 160 MG/5ML PO SOLN: 650.0000 mg | Freq: Four times a day (QID) | ORAL | Status: DC | PRN

## 2019-09-22 MED ORDER — LACTATED RINGERS IV BOLUS
500.0000 mL | Freq: Once | INTRAVENOUS | Status: AC
  Administered 2019-09-22: 500 mL via INTRAVENOUS

## 2019-09-22 MED ORDER — CHLORHEXIDINE GLUCONATE 0.12% ORAL RINSE (MEDLINE KIT)
15.0000 mL | Freq: Two times a day (BID) | OROMUCOSAL | Status: DC
  Administered 2019-09-22 – 2019-09-27 (×11): 15 mL via OROMUCOSAL

## 2019-09-22 MED ORDER — ETOMIDATE 2 MG/ML IV SOLN
25.0000 mg | Freq: Once | INTRAVENOUS | Status: AC
  Administered 2019-09-21: 25 mg via INTRAVENOUS

## 2019-09-22 MED ORDER — INSULIN ASPART 100 UNIT/ML ~~LOC~~ SOLN
0.0000 [IU] | SUBCUTANEOUS | Status: DC
  Administered 2019-09-22: 7 [IU] via SUBCUTANEOUS
  Administered 2019-09-24 (×4): 4 [IU] via SUBCUTANEOUS
  Administered 2019-09-25 (×2): 3 [IU] via SUBCUTANEOUS
  Administered 2019-09-25: 4 [IU] via SUBCUTANEOUS
  Administered 2019-09-25: 3 [IU] via SUBCUTANEOUS
  Administered 2019-09-25: 7 [IU] via SUBCUTANEOUS
  Administered 2019-09-25: 4 [IU] via SUBCUTANEOUS
  Administered 2019-09-26: 3 [IU] via SUBCUTANEOUS
  Administered 2019-09-27: 7 [IU] via SUBCUTANEOUS
  Administered 2019-09-27: 3 [IU] via SUBCUTANEOUS
  Administered 2019-09-28: 7 [IU] via SUBCUTANEOUS

## 2019-09-22 MED ORDER — VITAL HIGH PROTEIN PO LIQD
1000.0000 mL | ORAL | Status: DC
  Administered 2019-09-23 – 2019-09-25 (×2): 1000 mL

## 2019-09-22 MED ORDER — SUCCINYLCHOLINE CHLORIDE 20 MG/ML IJ SOLN
200.0000 mg | Freq: Once | INTRAMUSCULAR | Status: AC
  Administered 2019-09-21: 200 mg via INTRAVENOUS

## 2019-09-22 MED ORDER — VITAL AF 1.2 CAL PO LIQD: 1000.0000 mL | ORAL | Status: DC

## 2019-09-22 MED ORDER — DEXTROSE 50 % IV SOLN
INTRAVENOUS | Status: AC
  Filled 2019-09-22: qty 50

## 2019-09-22 MED ORDER — SODIUM CHLORIDE 0.9% FLUSH: 10.0000 mL | INTRAVENOUS | Status: DC | PRN

## 2019-09-22 MED ORDER — DEXTROSE 50 % IV SOLN
25.0000 g | Freq: Once | INTRAVENOUS | Status: AC
  Administered 2019-09-22: 25 g via INTRAVENOUS

## 2019-09-22 MED ORDER — LIP MEDEX EX OINT
TOPICAL_OINTMENT | CUTANEOUS | Status: DC | PRN
  Filled 2019-09-22 (×2): qty 7

## 2019-09-22 MED ORDER — CHLORHEXIDINE GLUCONATE CLOTH 2 % EX PADS
6.0000 | MEDICATED_PAD | Freq: Every day | CUTANEOUS | Status: DC
  Administered 2019-09-23 – 2019-09-30 (×4): 6 via TOPICAL

## 2019-09-22 MED ORDER — SODIUM CHLORIDE 0.9% FLUSH
10.0000 mL | Freq: Two times a day (BID) | INTRAVENOUS | Status: DC
  Administered 2019-09-22: 10 mL
  Administered 2019-09-22 – 2019-09-23 (×2): 40 mL
  Administered 2019-09-24: 10 mL
  Administered 2019-09-24: 30 mL
  Administered 2019-09-25 – 2019-10-02 (×16): 10 mL
  Administered 2019-10-03: 20 mL

## 2019-09-22 MED ORDER — ALBUMIN HUMAN 5 % IV SOLN
12.5000 g | Freq: Once | INTRAVENOUS | Status: AC
  Administered 2019-09-22: 12.5 g via INTRAVENOUS
  Filled 2019-09-22: qty 250

## 2019-09-22 NOTE — Progress Notes (Signed)
eLink Physician-Brief Progress Note Patient Name: Patricia Mata DOB: 1956-07-15 MRN: [id number]   Date of Service  09/22/2019  HPI/Events of Note  Respiratory acidosis  eICU Interventions  RR increased to 35, repeat ABG at 7 a.m.        [name] [name] 09/22/2019, 5:32 AM

## 2019-09-22 NOTE — Progress Notes (Signed)
Pt's O2 sat was in the low 80's. Pt had on HFNC and NRBM. Pt was having increased trouble breathing with accessory muscle use. Pt was given PRN meds with no relief. E-link came on the camera and Dr. Lucile Shutters instructed RN to assist pt to prone position. Pt's sats still remained in the high 70's to low 80's. Pt still using Accessory muscles to breath and SOB. An ABG was ordered and obtained by respiratory. RN reported the ABG results to Dr. Lucile Shutters. Dr. Lucile Shutters wanted to move towards intubation.

## 2019-09-22 NOTE — Progress Notes (Signed)
CRITICAL VALUE ALERT  Critical Value:  PH 7.155 & CO2 67.2  Date & Time Notied:  09/22/2019  Provider Notified: E link  Orders Received/Actions taken: awaiting further orders. Labs improving

## 2019-09-22 NOTE — Progress Notes (Signed)
PT Cancellation Note  Patient Details Name: Aniko Finnigan MRN: [id number] DOB: 25-Apr-1956   Cancelled Treatment:    Reason Eval/Treat Not Completed: Medical issues which prohibited therapy , patient now on vent nd sedated will check back another time when stable .  Claretha Cooper 09/22/2019, 6:51 AM  [place] Pager [phone] Office [phone]

## 2019-09-22 NOTE — Progress Notes (Signed)
Abg drawn on the following vent settings: PRVC mode, VT325ml, rr30, 100%,+15peep.  Results for ANISIA, LEIJA (MRN [id number]) as of 09/22/2019 05:32  Ref. Range 09/22/2019 04:45  FIO2 Unknown 100.00  pH, Arterial Latest Ref Range: 7.350 - 7.450  7.155 (LL)  pCO2 arterial Latest Ref Range: 32.0 - 48.0 mmHg 67.2 (HH)  pO2, Arterial Latest Ref Range: 83.0 - 108.0 mmHg 157 (H)  Acid-base deficit Latest Ref Range: 0.0 - 2.0 mmol/L 5.6 (H)  Bicarbonate Latest Ref Range: 20.0 - 28.0 mmol/L 22.7  O2 Saturation Latest Units: % 99.2  Patient temperature Unknown 98.6  Allens test (pass/fail) Latest Ref Range: PASS  PASS

## 2019-09-22 NOTE — Progress Notes (Signed)
eLink Physician-Brief Progress Note Patient Name: Patricia Mata DOB: [birth year] MRN: [id number]   Date of Service  09/22/2019  HPI/Events of Note  MAP 60 on levophed and Vasopressin  eICU Interventions  Albumin 5  % 12.5 gm iv x 1        Cynitha Berte U Halo Shevlin 09/22/2019, 6:09 AM

## 2019-09-22 NOTE — Progress Notes (Signed)
Hypoglycemic Event  CBG: 54  Treatment: D50 25g per hypoglycemic standing orders and protocol. CBG rechecked 15 minutes after tx was given.  Symptoms: Asymptomatic   Follow-up CBG: Time:2038 CBG Result:157  Possible Reasons for Event:pt is NPO and and just began her tube feed administration this afternoon and is working up to goal rate.   Comments/MD notified: [place]

## 2019-09-22 NOTE — Progress Notes (Signed)
eLink Physician-Brief Progress Note Patient Name: Patricia Mata DOB: April 03, 1956 MRN: [id number]   Date of Service  09/22/2019  HPI/Events of Note  PH 7.11, PCO2 76.3, RR 21, Vt 350  eICU Interventions  RR increased to 30 per minute. ABG at 5 a.m.        [name] [name] 09/22/2019, 2:22 AM

## 2019-09-22 NOTE — Progress Notes (Signed)
CRITICAL VALUE ALERT  Critical Value:  PH 7.111 and pCO2 76.3  Date & Time Notied:  09/22/2019   Provider Notified: E-link doctor  Orders Received/Actions taken: awaiting further orders.

## 2019-09-22 NOTE — Procedures (Signed)
Central Venous Catheter Insertion Procedure Note Patricia Mata [id number] [birth year]  Procedure: Insertion of Central Venous Catheter Indications: Assessment of intravascular volume, Drug and/or fluid administration and Frequent blood sampling  Procedure Details Consent: Risks of procedure as well as the alternatives and risks of each were explained to the (patient/caregiver).  Consent for procedure obtained. Time Out: Verified patient identification, verified procedure, site/side was marked, verified correct patient position, special equipment/implants available, medications/allergies/relevent history reviewed, required imaging and test results available.  Performed  Maximum sterile technique was used including antiseptics, cap, gloves, gown, hand hygiene, mask and sheet. Skin prep: Chlorhexidine; local anesthetic administered 4 ml Lido 1% A non sulfur triple lumen catheter was placed in the left internal jugular vein using the Seldinger technique to 19 cm.  Line sutured.  Biopatch and sterile dressing applied.   Evaluation Blood flow good Complications: No apparent complications Patient did tolerate procedure well. Chest X-ray ordered to verify placement.  CXR: pending.   Kennieth Rad, MSN, AGACNP-BC [facility] 09/22/2019, 1:28 AM

## 2019-09-22 NOTE — Procedures (Addendum)
Bronchoscopy Procedure Note Cheryl Chay [id number] [birth year]  Procedure: Bronchoscopy Indications: Diagnostic evaluation of the airways and Obtain specimens for culture and/or other diagnostic studies  Procedure Details Consent: Risks of procedure as well as the alternatives and risks of each were explained to the (patient/caregiver).  Consent for procedure obtained. Time Out: Verified patient identification, verified procedure, site/side was marked, verified correct patient position, special equipment/implants available, medications/allergies/relevent history reviewed, required imaging and test results available.  Performed  In preparation for procedure, patient was given 100% FiO2. Sedation: Etomidate  Airway entered and the following bronchi were examined: RUL, RML, RLL, LUL, LLL and Bronchi.  Normal airway anatomy with no abnormalities.  No secretions, bleeding   Procedures performed: Bronchoscope was wedged in the lateral segment of right middle lobe and serial BAL performed with 30 cc aliquots x4.  Total 120 cc of saline instilled and about 80 cc of return.  Return was slightly bloody but no evidence of DAH or progressive blood on aliquots Sample was sent for microbiology, cell count studies.  Bronchoscope removed.  , Patient placed back on 100% FiO2 at conclusion of procedure.    Evaluation Hemodynamic Status: BP stable throughout; O2 sats: stable throughout Patient's Current Condition: stable Specimens:  Sent serosanguinous fluid Complications: No apparent complications Patient did tolerate procedure well.  Marshell Garfinkel MD [facility] Please see [email or web address] for pager details.  09/22/2019, 3:09 PM

## 2019-09-22 NOTE — Plan of Care (Signed)
Patient still hypoxic despite intubation with sats in 40's will paralyze with roc. If this is helpful will put on nimbex infusion. Will also place on central line and a line as patient so tenuous.  Newell Coral DO Internal Medicine/Pediatrics Pulmonary and Critical Care Fellow PGY-6

## 2019-09-22 NOTE — Progress Notes (Signed)
Abg drawn on the following vent settings: PRVC 3103ml, rr35, 80%, +15.  Results for LASTACIA, SOLUM (MRN [id number]) as of 09/22/2019 07:23  Ref. Range 09/22/2019 06:45  Delivery systems Unknown VENTILATOR  FIO2 Unknown 52.00  Mode Unknown PRESSURE REGULATED VOLUME CONTROL  VT Latest Units: mL 350  Peep/cpap Latest Units: cm H20 15.0  pH, Arterial Latest Ref Range: 7.350 - 7.450  7.182 (LL)  pCO2 arterial Latest Ref Range: 32.0 - 48.0 mmHg 61.8 (H)  pO2, Arterial Latest Ref Range: 83.0 - 108.0 mmHg 129 (H)  Acid-base deficit Latest Ref Range: 0.0 - 2.0 mmol/L 5.9 (H)  Bicarbonate Latest Ref Range: 20.0 - 28.0 mmol/L 22.4  O2 Saturation Latest Units: % 97.5  Patient temperature Unknown 98.2  Collection site Unknown A-LINE  Allens test (pass/fail) Latest Ref Range: PASS  PASS

## 2019-09-22 NOTE — Progress Notes (Signed)
CPT held @ this time-Per pt. condition.Marland KitchenMarland Kitchen

## 2019-09-22 NOTE — Progress Notes (Signed)
RT helped MD with bedside bronch.

## 2019-09-22 NOTE — Progress Notes (Signed)
NAME:  Patricia Mata, MRN:  [id number], DOB:  02/02/1956, LOS: 2 ADMISSION DATE:  09/20/2019, CONSULTATION DATE:  09/20/19 REFERRING MD:  Roel Cluck, CHIEF COMPLAINT:  Pneumonia  Brief History   This is a 64 yo with history of IDDM, HTN, CKD, Pulm HTN, gout, colon cancer who presents for recurrence of pna like symptoms after treatment for CAP for 5 days on previous admisison from 3/02-3/05.   History of present illness   This is a 64 yo with history as noted above with history of IDDM, HTN, CKD, Pulm HTN, gout, colon cancer who presents for recurrence of pna like symptoms after treatment for CAP for 5 days on previous admisison from 3/02-3/05. Today presenting with recurrence of pna like symptoms. Increased cough that is green in color. Patient with increased WOB as well. Felt it was getting harder to breath. No increase in weight. No lower extremity edema. Has been taking lasix and sildenafil daily. No fevers but is feeling worse. Smoked for about 15 pack years 20 years ago. No travels recently. Has not lived in [place] country for more than 6 months. Has lived here in [place] whole life. 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 No tb risk factor including homelessness, IVDU or incarceration. Has a dog that lives with her. No birds.   Past Medical History  CAP Anemia in CKD Essential HNT HLD T2DM Hypoxia  Significant Hospital Events   Patient being admitted to stepdown  Consults:  PCCM  Procedures:  09/22/2018 intubation>>  Significant Diagnostic Tests:  CT chest 3/8 > ARDS, air bronchograms c/w PNA, trace effusions, cardiomegaly, left adrenal nodule, hepatomegaly. Echocardiogram   Micro Data:  Sputum 3/8 > RVP 3/8 > neg. COVID 3/8 > neg. 09/22/2019 tracheal aspirate>> Antimicrobials:  Vancomycin Cefepime  Interim history/subjective:  Required intubation during the night. Currently on ARDS protocol Heavily sedated and required neuromuscular blockade for asynchrony  with ventilator. Objective   Blood pressure (!) 148/39, pulse (!) 55, temperature 98.2 F (36.8 C), temperature source Axillary, resp. rate (!) 35, height 5\' 4"  (1.626 m), weight 81.6 kg, SpO2 100 %.    Vent Mode: PRVC FiO2 (%):  [55 %-100 %] 55 % Set Rate:  [18 bmp-35 bmp] 35 bmp Vt Set:  [350 mL-430 mL] 430 mL PEEP:  [10 cmH20-15 cmH20] 15 cmH20 Plateau Pressure:  [20 cmH20-32 cmH20] 28 cmH20   Intake/Output Summary (Last 24 hours) at 09/22/2019 0851 Last data filed at 09/22/2019 2951 Gross per 24 hour  Intake 2003.24 ml  Output 2900 ml  Net -896.76 ml   Filed Weights   09/20/19 1741  Weight: 81.6 kg    Examination: General: Obese female heavily sedated on full mechanical ventilatory support HEENT: Intubated gastric tube in place no JVD appreciated Neuro: Currently heavily sedated and recently received neuromuscular blockade CV: Heart sounds regular regular rhythm PULM: Diminished in the bases rhonchorous breath sounds Vent pressure regulated volume control FIO2 60 PEEP 10 RATE 35  VT increased from 3 50-4 30 ABGs pending Chest x-ray 09/22/2019 endotracheal tube appropriate position with bilateral airspace disease noted GI: soft, bsx4 active  GU: Amber urine Extremities: warm/dry, 1+ edema  Skin: no rashes or lesions    Assessment & Plan:  Vent dependent respiratory failure intubated and placed on ARDS protocol 09/21/2018 HAP-Patient has failed CAP coverage ans was just hospitalized on 09/14/19. Given this would classify this as HAP.  Did get treatment for atypical with azithromycin during recent hospitalization.  Initially some  concern for ILD / UIP; however, CT chest not c/w this and more favorable for multifocal PNA + ARDS.  Of note patiuet was admitted for COVID on 03/18/19, had normal pft's on 03/14/19 with normal TLC and FEV/FVC after that was normal but did have decreased DLCO.  ARDS protocol Arterial line May need to be prone Questionable role for steroids Continue  antimicrobial therapy Monitor culture data   Shock Vasopressors as needed Hold diuresis Continue antimicrobial therapy  Severe cough. Resolved with intubation and sedation  DM CBG (last 3)  Recent Labs    09/21/19 1555 09/21/19 1947 09/22/19 0019  GLUCAP 117* 106* 234*   Diabetes mellitus with steroid exacerbation therefore sliding scale insulin  Pulm HTN-Appears to be stable . Most recent right heart cath on 04/23/19 Noted PA pressure of 57/12(32) capillary wedge was 14 which was much improved from right heart cath in 2019 which noted sever PH with mean of 62 (105/40) cappillary wedge of 25. Echo bubble study Diuresis available Sedation as needed  Left adrenal nodule. Outpatient follow-up in a few   Best practice:  Diet: Npo for now Pain/Anxiety/Delirium protocol (if indicated): Sedation protocol VAP protocol (if indicated): DVT prophylaxis: Heparin GI prophylaxis: PPI Mobility: Rest Code Status: Code Family Communication: Patient currently intubated will need to update family Disposition: Sheisintubated   CC time: 45 min.   [name] Cecely Rengel ACNP Acute Care Nurse Practitioner [place] Please consult Amion 09/22/2019, 8:51 AM

## 2019-09-22 NOTE — Progress Notes (Signed)
eLink Physician-Brief Progress Note Patient Name: Patricia Mata DOB: 10-24-1955 MRN: [id number]   Date of Service  09/22/2019  HPI/Events of Note  Hpotension  eICU Interventions  Vasopressin ordered.        [name] Dori Devino 09/22/2019, 3:16 AM

## 2019-09-22 NOTE — Progress Notes (Signed)
RN gave 20 of etomidate and a 50 fentanyl bolus before bronchoscopy bedside procedure

## 2019-09-22 NOTE — Progress Notes (Signed)
Inpatient Diabetes Program Recommendations  AACE/ADA: New Consensus Statement on Inpatient Glycemic Control   Target Ranges:  Prepandial:   less than 140 mg/dL      Peak postprandial:   less than 180 mg/dL (1-2 hours)      Critically ill patients:  140 - 180 mg/dL  Results for NORIE, LATENDRESSE (MRN [id number]) as of 09/22/2019 08:06  Ref. Range 09/22/2019 03:00  Glucose Latest Ref Range: 70 - 99 mg/dL 265 (H)   Results for AERICA, RINCON (MRN [id number]) as of 09/22/2019 08:06  Ref. Range 09/21/2019 07:41 09/21/2019 11:46 09/21/2019 15:55 09/21/2019 19:47 09/22/2019 00:19  Glucose-Capillary Latest Ref Range: 70 - 99 mg/dL 118 (H) 157 (H) 117 (H) 106 (H) 234 (H)   Review of Glycemic Control  Diabetes history: DM2 Outpatient Diabetes medications: Lantus 5 units BID if glucose is >150 mg/dl, Januvia 50 mg daily Current orders for Inpatient glycemic control: Lantus 5 units QHS, Novolog 0-9 units Q4H  Inpatient Diabetes Program Recommendations:    Insulin-Correction: Please consider changing to ICU Glycemic Control order set Phase 1 with Novolog 2-6 units Q4H.  NOTE: Noted patient was intubated during the night and started on pressors.   Thanks, Barnie Alderman, RN, MSN, CDE Diabetes Coordinator Inpatient Diabetes Program [phone] (Team Pager from 8am to 5pm)

## 2019-09-22 NOTE — Procedures (Signed)
Arterial Catheter Insertion Procedure Note Bernice Mcauliffe [id number] 01-02-1956  Procedure: Insertion of Arterial Catheter  Indications: Blood pressure monitoring and Frequent blood sampling  Procedure Details Consent: Risks of procedure as well as the alternatives and risks of each were explained to the (patient/caregiver).  Consent for procedure obtained. Time Out: Verified patient identification, verified procedure, site/side was marked, verified correct patient position, special equipment/implants available, medications/allergies/relevent history reviewed, required imaging and test results available.  Performed  Maximum sterile technique was used including antiseptics, cap, gloves, gown, hand hygiene, mask and sheet. Skin prep: Chlorhexidine 20 gauge catheter was inserted into right radial artery using the Seldinger technique. ULTRASOUND GUIDANCE USED: YES Evaluation Blood flow good; BP tracing good. Complications: No apparent complications.  Biopatch and sterile dressing applied.    Kennieth Rad, MSN, AGACNP-BC [facility] 09/22/2019, 1:27 AM

## 2019-09-22 NOTE — Progress Notes (Signed)
Subjective:   Intubated and sedated   Objective:  Vitals:   09/22/19 0700 09/22/19 0800 09/22/19 0816 09/22/19 0827  BP: (!) 110/33 (!) 127/36  (!) 148/39  Pulse:  (!) 55  (!) 55  Resp: (!) 35 (!) 28  (!) 35  Temp:  98.2 F (36.8 C)    TempSrc:  Axillary    SpO2:  100% 100%   Weight:      Height:        Intake/Output from previous day:  Intake/Output Summary (Last 24 hours) at 09/22/2019 0935 Last data filed at 09/22/2019 6333 Gross per 24 hour  Intake 2003.24 ml  Output 2900 ml  Net -896.76 ml    Physical Exam:  Chronically ill black female  Intubated Right IJ catheter AS murmur  BS positive Pedal pulses intact  Rhonchi anteriorly   Lab Results: Basic Metabolic Panel: Recent Labs    09/21/19 0300 09/22/19 0300  NA 138 136  K 4.0 4.5  CL 108 104  CO2 22 23  GLUCOSE 186* 265*  BUN 62* 59*  CREATININE 2.26* 2.49*  CALCIUM 8.9 8.4*  MG 1.6* 1.9  PHOS 3.8 7.0*   Liver Function Tests: Recent Labs    09/21/19 0300 09/22/19 0300  AST 29 28  ALT 19 17  ALKPHOS 135* 114  BILITOT 1.2 1.2  PROT 7.1 6.1*  ALBUMIN 2.7* 2.3*   No results for input(s): LIPASE, AMYLASE in the last 72 hours. CBC: Recent Labs    09/21/19 0300 09/22/19 0300  WBC 27.0* 30.2*  NEUTROABS 22.2* 24.8*  HGB 7.9* 7.0*  HCT 24.4* 22.5*  MCV 92.4 95.3  PLT 281 329   Cardiac Enzymes: No results for input(s): CKTOTAL, CKMB, CKMBINDEX, TROPONINI in the last 72 hours. BNP: Invalid input(s): POCBNP D-Dimer: Recent Labs    09/20/19 1823  DDIMER 2.53*   Hemoglobin A1C: Recent Labs    09/21/19 0056  HGBA1C 6.5*   Fasting Lipid Panel: Recent Labs    09/21/19 2336  TRIG 108   Thyroid Function Tests: Recent Labs    09/21/19 0300  TSH 2.435   Anemia Panel: Recent Labs    09/20/19 1826 09/20/19 2131 09/21/19 0056 09/21/19 0300  VITAMINB12  --   --   --  1,877*  FOLATE  --   --   --  17.3  FERRITIN 1,870*  --   --   --   TIBC  --  187*  --   --   IRON   --  50  --   --   RETICCTPCT  --   --  3.4*  --     Imaging: DG Abd 1 View  Result Date: 09/22/2019 CLINICAL DATA:  64 year old female NG tube placement. EXAM: ABDOMEN - 1 VIEW COMPARISON:  Portable chest 2300 hours yesterday. FINDINGS: Portable AP supine view at 0016 hours. Enteric tube has been placed into the stomach with tip at the level of the gastric antrum. Gas-filled but nondilated visible bowel loops. Coarse and confluent bilateral lower lung opacity again noted. IMPRESSION: Enteric tube placed into the stomach, tip at the gastric antrum. Electronically Signed   By: Genevie Ann M.D.   On: 09/22/2019 00:39   CT CHEST WO CONTRAST  Result Date: 09/21/2019 CLINICAL DATA:  Recurrence pneumonia. Low oxygen saturation. EXAM: CT CHEST WITHOUT CONTRAST TECHNIQUE: Multidetector CT imaging of the chest was performed following the standard protocol without IV contrast. COMPARISON:  March 26, 2011. FINDINGS: Cardiovascular: Moderate cardiomegaly. Coarse  mitral annulus, aortic valvular and four-vessel coronary calcification. Pulmonary artery hypertension, the pulmonary trunk measuring 3.3 cm diameter. Central pulmonary vascular congestion. Mediastinum/Nodes: Nonspecific enlarged right paratracheal noncalcified lymph node measuring 18 by 22 mm. Additional abnormal number of noncalcified clustered mediastinal subcentimeter short axis diameter lymph nodes. Pericardial recess fluid. Lungs/Pleura: Diffuse reticulonodular interlobular septal thickening with patchy airspace and small airways disease throughout both lungs, commonly seen in the setting of ARDS or pulmonary edema or nonspecific interstitial pneumonitis or COVID infection. Trace bilateral pleural fluid. Upper Abdomen: Mild hepatomegaly. Coarse thoracoabdominal aorta and major abdominal artery calcified atherosclerosis. A nonspecific 7 mm diameter lymph node in the left upper abdominal quadrant ventral to the left hemiliver. Partial characterization of a  15 mm 30 Hounsfield unit left adrenal nodule. No apparent abnormality of the right adrenal gland. Musculoskeletal: Mild chest wall anasarca. Mild skeletal degenerative changes. IMPRESSION: 1. Diffuse interlobular septal thickening with patchy airspace and small airways disease throughout both lungs, commonly seen in the setting of ARDS or pulmonary edema or nonspecific interstitial pneumonitis or COVID infection. 2. Trace bilateral pleural fluid. 3. Cardiomegaly and evidence of pulmonary artery hypertension. 4. Mediastinal nonspecific adenopathy and a left upper abdominal quadrant small lymph node. 5. Thoracoabdominal aorta calcified atherosclerosis and severe 4-vessel coronary calcification. 6. Partial characterization of a 15 mm 30 Hounsfield unit left adrenal nodule. Further characterization with MR abdomen adrenal protocol recommended in 3-6 months. 7. Hepatomegaly. Aortic Atherosclerosis (ICD10-I70.0). Electronically Signed   By: Revonda Humphrey   On: 09/21/2019 00:33   DG Chest Port 1 View  Result Date: 09/22/2019 CLINICAL DATA:  Respiratory failure. EXAM: PORTABLE CHEST 1 VIEW COMPARISON:  09/22/2019 FINDINGS: The endotracheal tube is 3.5 cm above the carina. The left IJ central venous catheter tip is in the mid SVC. The NG tube is coursing down the esophagus and into the stomach. Persistent diffuse interstitial and airspace process in the lungs. No definite pleural effusion or pneumothorax. IMPRESSION: 1. Stable support apparatus. 2. Persistent diffuse interstitial and airspace process. Electronically Signed   By: Marijo Sanes M.D.   On: 09/22/2019 08:24   DG CHEST PORT 1 VIEW  Result Date: 09/22/2019 CLINICAL DATA:  Central line placement EXAM: PORTABLE CHEST 1 VIEW COMPARISON:  September 21, 2019 FINDINGS: The endotracheal tube terminates approximately 4.2 cm above the carina. The left-sided central venous catheter is well position. The enteric tube appears to extend below the left hemidiaphragm. Diffuse  bilateral pulmonary opacities are again noted with slight interval improvement. There is no pneumothorax. The heart size is stable from prior study. IMPRESSION: 1. Lines and tubes as above.  No pneumothorax. 2. Persistent bilateral pulmonary opacities as before. Electronically Signed   By: Constance Holster M.D.   On: 09/22/2019 02:10   DG CHEST PORT 1 VIEW  Result Date: 09/21/2019 CLINICAL DATA:  Intubation. EXAM: PORTABLE CHEST 1 VIEW COMPARISON:  September 20, 2019 FINDINGS: An endotracheal tube has been placed, its tip projects 4.0 cm above the carina. There is similar lung inflation. Cardiomegaly, interstitial edema and diffuse alveolar with patchy airspace opacities are redemonstrated. Dense consolidation obscures the cardiac borders and a slightly more confluent. There are new air bronchograms in the left suprahilar region. Aortic knob calcified atherosclerosis and skeletal degenerative changes present. There is partial imaging of gaseous gastric distension. Telemetry leads are present. There is no pneumothorax or obvious pleural effusion. The right costophrenic angle is incompletely imaged. IMPRESSION: Interval intubation, the endotracheal tube tip projecting 4.0 cm above the carina. Slight interval worsening  of extensive bilateral pulmonary alveolar and airspace disease, the latter more confluent. Cardiomegaly and thoracic aortic calcified atherosclerosis. Partial imaging of gaseous gastric distension. Electronically Signed   By: Revonda Humphrey   On: 09/21/2019 23:46   DG Chest Port 1 View  Result Date: 09/20/2019 CLINICAL DATA:  Shortness of breath EXAM: PORTABLE CHEST 1 VIEW COMPARISON:  September 14, 2019 FINDINGS: Diffuse bilateral airspace opacities are again noted. The heart size is stable but enlarged. Aortic calcifications are noted. There are probable bilateral pleural effusions. There is no acute osseous abnormality. IMPRESSION: Interval worsening of bilateral airspace opacities, otherwise no  significant interval change. Electronically Signed   By: Constance Holster M.D.   On: 09/20/2019 19:00   ECHOCARDIOGRAM COMPLETE BUBBLE STUDY  Result Date: 09/21/2019    ECHOCARDIOGRAM REPORT   Patient Name:   Patricia Mata Date of Exam: 09/21/2019 Medical Rec #:  [id number]    Height:       64.0 in Accession #:    [id number]   Weight:       180.0 lb Date of Birth:  [birth year]     BSA:          1.871 m Patient Age:    64 years     BP:           145/42 mmHg Patient Gender: F            HR:           81 bpm. Exam Location:  Inpatient Procedure: 2D Echo, Color Doppler, Cardiac Doppler and Saline Contrast Bubble            Study Indications:    R94.31 Abnormal EKG  History:        Patient has prior history of Echocardiogram examinations, most                 recent 02/15/2019. Pulmonary HTN; Risk Factors:Hypertension,                 Diabetes and Dyslipidemia. COVID+ 03/18/19.  Sonographer:    Raquel Sarna Senior RDCS Referring Phys: [id number] Tyna Jaksch  Sonographer Comments: Scanned supine on high flow positive pressure nasal cannula IMPRESSIONS  1. Left ventricular ejection fraction, by estimation, is 65 to 70%. The left ventricle has hyperdynamic function. The left ventricle has no regional wall motion abnormalities. Left ventricular diastolic parameters are consistent with Grade II diastolic dysfunction (pseudonormalization). Elevated left atrial pressure.  2. Right ventricular systolic function is normal. The right ventricular size is normal. There is moderately elevated pulmonary artery systolic pressure.  3. Left atrial size was severely dilated.  4. 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 The mean mitral valve gradient is 9.4 mmHg with average heart rate of 81 bpm.  5. The aortic valve has an indeterminant number of cusps. Aortic valve regurgitation is trivial. Severe aortic valve stenosis. Aortic valve area, by VTI measures 0.82 cm. Aortic valve  mean gradient measures 42.0 mmHg. Aortic valve Vmax measures 4.32 m/s.  6. The inferior vena cava is normal in size with greater than 50% respiratory variability, suggesting right atrial pressure of 3 mmHg.  7. Agitated saline contrast bubble study was negative, with no evidence of any interatrial shunt. Comparison(s): Prior images reviewed side by side. Changes from prior study are noted. Increased mitral gradients are partly explained by increased heart rate and cardiac output. FINDINGS  Left Ventricle: Left ventricular ejection fraction, by estimation, is  65 to 70%. The left ventricle has hyperdynamic function. The left ventricle has no regional wall motion abnormalities. The left ventricular internal cavity size was normal in size. There is no left ventricular hypertrophy. Left ventricular diastolic parameters are consistent with Grade II diastolic dysfunction (pseudonormalization). Elevated left atrial pressure. Right Ventricle: The right ventricular size is normal. No increase in right ventricular wall thickness. Right ventricular systolic function is normal. There is moderately elevated pulmonary artery systolic pressure. The tricuspid regurgitant velocity is 3.71 m/s, and with an assumed right atrial pressure of 3 mmHg, the estimated right ventricular systolic pressure is 30.8 mmHg. Left Atrium: Left atrial size was severely dilated. Right Atrium: Right atrial size was normal in size. Pericardium: There is no evidence of pericardial effusion. Mitral Valve: The mitral valve is rheumatic. There is moderate thickening of the mitral valve leaflet(s). Severe mitral annular calcification. Mild to moderate mitral valve regurgitation, with centrally-directed jet. Mild to moderate mitral valve stenosis. MV peak gradient, 25.8 mmHg. The mean mitral valve gradient is 9.4 mmHg with average heart rate of 81 bpm. Tricuspid Valve: The tricuspid valve is normal in structure. Tricuspid valve regurgitation is mild. Aortic  Valve: The aortic valve has an indeterminant number of cusps. Aortic valve regurgitation is trivial. Severe aortic stenosis is present. Aortic valve mean gradient measures 42.0 mmHg. Aortic valve peak gradient measures 74.6 mmHg. Aortic valve area, by VTI measures 0.82 cm. Pulmonic Valve: The pulmonic valve was grossly normal. Pulmonic valve regurgitation is not visualized. Aorta: The aortic root is normal in size and structure. Venous: The inferior vena cava is normal in size with greater than 50% respiratory variability, suggesting right atrial pressure of 3 mmHg. IAS/Shunts: No atrial level shunt detected by color flow Doppler. Agitated saline contrast was given intravenously to evaluate for intracardiac shunting. Agitated saline contrast bubble study was negative, with no evidence of any interatrial shunt.  LEFT VENTRICLE PLAX 2D LVIDd:         5.00 cm  Diastology LVIDs:         2.90 cm  LV e' lateral:   7.40 cm/s LV PW:         1.20 cm  LV E/e' lateral: 27.0 LV IVS:        1.00 cm  LV e' medial:    5.66 cm/s LVOT diam:     2.20 cm  LV E/e' medial:  35.3 LV SV:         88 LV SV Index:   47 LVOT Area:     3.80 cm  RIGHT VENTRICLE RV S prime:     10.80 cm/s TAPSE (M-mode): 2.2 cm LEFT ATRIUM             Index       RIGHT ATRIUM           Index LA diam:        4.90 cm 2.62 cm/m  RA Area:     21.30 cm LA Vol (A2C):   75.2 ml 40.20 ml/m RA Volume:   62.50 ml  33.41 ml/m LA Vol (A4C):   82.7 ml 44.21 ml/m LA Biplane Vol: 78.9 ml 42.18 ml/m  AORTIC VALVE AV Area (Vmax):    0.90 cm AV Area (Vmean):   0.84 cm AV Area (VTI):     0.82 cm AV Vmax:           432.00 cm/s AV Vmean:          309.000 cm/s AV VTI:  1.070 m AV Peak Grad:      74.6 mmHg AV Mean Grad:      42.0 mmHg LVOT Vmax:         102.00 cm/s LVOT Vmean:        68.200 cm/s LVOT VTI:          0.231 m LVOT/AV VTI ratio: 0.22  AORTA Ao Root diam: 2.70 cm Ao Asc diam:  2.10 cm MITRAL VALVE                TRICUSPID VALVE MV Area (PHT): 3.39 cm      TR Peak grad:   55.1 mmHg MV Peak grad:  25.8 mmHg    TR Vmax:        371.00 cm/s MV Mean grad:  9.4 mmHg MV Vmax:       2.54 m/s     SHUNTS MV Vmean:      140.0 cm/s   Systemic VTI:  0.23 m MV Decel Time: 224 msec     Systemic Diam: 2.20 cm MV E velocity: 200.00 cm/s MV A velocity: 130.00 cm/s MV E/A ratio:  1.54 Mihai Croitoru MD Electronically signed by Sanda Klein MD Signature Date/Time: 09/21/2019/12:09:35 PM    Final    VAS [place] LOWER EXTREMITY VENOUS (DVT)  Result Date: 09/21/2019  Lower Venous DVTStudy Indications: Hypoxia.  Risk Factors: None identified. Limitations: Poor ultrasound/tissue interface. Comparison Study: No prior studies. Performing Technologist: Oliver Hum RVT  Examination Guidelines: A complete evaluation includes B-mode imaging, spectral Doppler, color Doppler, and power Doppler as needed of all accessible portions of each vessel. Bilateral testing is considered an integral part of a complete examination. Limited examinations for reoccurring indications may be performed as noted. The reflux portion of the exam is performed with the patient in reverse Trendelenburg.  +---------+---------------+---------+-----------+----------+--------------+ RIGHT    CompressibilityPhasicitySpontaneityPropertiesThrombus Aging +---------+---------------+---------+-----------+----------+--------------+ CFV      Full           Yes      Yes                                 +---------+---------------+---------+-----------+----------+--------------+ SFJ      Full                                                        +---------+---------------+---------+-----------+----------+--------------+ FV Prox  Full                                                        +---------+---------------+---------+-----------+----------+--------------+ FV Mid   Full                                                         +---------+---------------+---------+-----------+----------+--------------+ FV DistalFull                                                        +---------+---------------+---------+-----------+----------+--------------+  PFV      Full                                                        +---------+---------------+---------+-----------+----------+--------------+ POP      Full           Yes      Yes                                 +---------+---------------+---------+-----------+----------+--------------+ PTV      Full                                                        +---------+---------------+---------+-----------+----------+--------------+ PERO     Full                                                        +---------+---------------+---------+-----------+----------+--------------+   +---------+---------------+---------+-----------+----------+--------------+ LEFT     CompressibilityPhasicitySpontaneityPropertiesThrombus Aging +---------+---------------+---------+-----------+----------+--------------+ CFV      Full           Yes      Yes                                 +---------+---------------+---------+-----------+----------+--------------+ SFJ      Full                                                        +---------+---------------+---------+-----------+----------+--------------+ FV Prox  Full                                                        +---------+---------------+---------+-----------+----------+--------------+ FV Mid   Full                                                        +---------+---------------+---------+-----------+----------+--------------+ FV DistalFull                                                        +---------+---------------+---------+-----------+----------+--------------+ PFV      Full                                                         +---------+---------------+---------+-----------+----------+--------------+  POP      Full           Yes      Yes                                 +---------+---------------+---------+-----------+----------+--------------+ PTV      Full                                                        +---------+---------------+---------+-----------+----------+--------------+ PERO     Full                                                        +---------+---------------+---------+-----------+----------+--------------+     Summary: RIGHT: - There is no evidence of deep vein thrombosis in the lower extremity.  - No cystic structure found in the popliteal fossa.  LEFT: - There is no evidence of deep vein thrombosis in the lower extremity.  - No cystic structure found in the popliteal fossa.  *See table(s) above for measurements and observations. Electronically signed by Harold Barban MD on 09/21/2019 at 5:49:31 PM.    Final     Cardiac Studies:  ECG:  SR rate 79 nonspecific ST changes    Telemetry: ? Sinus or junctional with long PR   Echo: EF 65-70% mild/moderate MR mild MS mean gradient 9 mmHg severe AS mean gradient 42 mmHg  Medications:   . allopurinol  100 mg Oral Daily  . atorvastatin  40 mg Oral q1800  . benzonatate  200 mg Oral TID  . chlorhexidine gluconate (MEDLINE KIT)  15 mL Mouth Rinse BID  . Chlorhexidine Gluconate Cloth  6 each Topical Q0600  . EPINEPHrine      . heparin injection (subcutaneous)  5,000 Units Subcutaneous Q8H  . insulin aspart  0-20 Units Subcutaneous Q4H  . insulin glargine  5 Units Subcutaneous Q2200  . latanoprost  1 drop Both Eyes QHS  . levothyroxine  88 mcg Oral Q0600  . mouth rinse  15 mL Mouth Rinse 10 times per day  . sildenafil  20 mg Oral TID  . sodium chloride flush  10-40 mL Intracatheter Q12H  . sodium chloride flush  3 mL Intravenous Q12H     . sodium chloride Stopped (09/21/19 0756)  . sodium chloride    . ceFEPime (MAXIPIME) IV Stopped  (09/21/19 1853)  . fentaNYL infusion INTRAVENOUS 200 mcg/hr (09/22/19 6761)  . norepinephrine (LEVOPHED) Adult infusion 40 mcg/min (09/22/19 9509)  . propofol (DIPRIVAN) infusion 20 mcg/kg/min (09/22/19 0749)  . vancomycin    . 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 MDS Amiodarone d/c but Doubt acute/chronic lung toxicity from this. Telemetry this am ? Junctional with retrograde P will check ECG She will do better with low HRls due to MS physiology from [place] 09/22/2019, 9:35 AM

## 2019-09-22 NOTE — Progress Notes (Signed)
Abg on the following vent settings: PRVC mode, VT 375ml, rr21, 100%, +15 peep.  Results for LILLYN, WIECZOREK (MRN [id number]) as of 09/22/2019 02:14  Ref. Range 09/22/2019 01:30  FIO2 Unknown 100.00  pH, Arterial Latest Ref Range: 7.350 - 7.450  7.111 (LL)  pCO2 arterial Latest Ref Range: 32.0 - 48.0 mmHg 76.3 (HH)  pO2, Arterial Latest Ref Range: 83.0 - 108.0 mmHg 95.3  Acid-base deficit Latest Ref Range: 0.0 - 2.0 mmol/L 6.0 (H)  Bicarbonate Latest Ref Range: 20.0 - 28.0 mmol/L 23.2  O2 Saturation Latest Units: % 94.8  Patient temperature Unknown 98.8  Allens test (pass/fail) Latest Ref Range: PASS  PASS

## 2019-09-22 NOTE — Progress Notes (Signed)
OT Cancellation Note  Patient Details Name: Patricia Mata MRN: [id number] DOB: 1956/01/10   Cancelled Treatment:    Reason Eval/Treat Not Completed: Medical issues which prohibited therapy. patient now on vent nd sedated will check back another time when stable .   Anicka Stuckert 09/22/2019, 8:16 AM

## 2019-09-22 NOTE — Progress Notes (Addendum)
Initial Nutrition Assessment  DOCUMENTATION CODES:   Not applicable  INTERVENTION:  - will start TF at trickle rate and increase. - should patient require proning, recommend place small bore NGT postpyloric and then re-start TF.  - will order Vital High Protein @ 20 ml/hr to advance by 10 ml every 8 hours to reach goal rate of 60 ml/hr. - at goal rate, this regimen + kcal from current propofol rate will provide 1828 kcal (105% estimated kcal need), 126 grams protein, 1152 mg Phos, and 1204 ml free water.    NUTRITION DIAGNOSIS:   Inadequate oral intake related to inability to eat as evidenced by NPO status.  GOAL:   Provide needs based on ASPEN/SCCM guidelines  MONITOR:   Vent status, Labs, Weight trends  REASON FOR ASSESSMENT:   Ventilator  ASSESSMENT:   64 y.o. female with medical history of HTN, DM, MDS, pulmonary HTN, anemia of CKD, chronic afib, gout, and hypothyroidism. She was diagnosed with COVID-19 >6 months ago. She presented to the ED with worsening SOB since d/c on 3/5 (admission was for CAP). She reported CBGs in the 200s since d/c.  Patient was intubated yesterday at 2340 and OGT place. OGT currently to LIS with no output, no drainage in tubing. No family/visitors present. Able to talk with RN about patient. Per chart review, weight yesterday was 180 lb and weight on 07/27/19 was 183 lb. This indicates 3 lb weight loss (1.6% body weight) in the past 2 months; not significant for time frame.   Per notes: - ARDS protocol--possible need for proning - shock - severe cough--resolved with intubation - L adrenal nodule  Patient is currently intubated on ventilator support MV: 14.7 L/min Temp (24hrs), Avg:98.4 F (36.9 C), Min:97.9 F (36.6 C), Max:98.8 F (37.1 C) Propofol: 14.7 ml/hr (388 kcal) BP: 174/40 and MAP: 86   Labs reviewed; BUN: 59 mg/dl, creatinine: 2.49 mg/dl, Ca: 8.4 mg/dl, Phos: 7 mg/dl, GFR: 23 ml/min. Medications reviewed; sliding scale  novolog, 5 units lantus/day, 88 mcg oral synthroid/day. Drips; levo @ 32 mcg/min, propofol @ 30 mcg/kg/min, fentatnyl @ 200 mcg/hr, vaso @ 0.03 units/min.     NUTRITION - FOCUSED PHYSICAL EXAM:  completed; no muscle or fat wasting.   Diet Order:   Diet Order            Diet NPO time specified  Diet effective now              EDUCATION NEEDS:   No education needs have been identified at this time  Skin:  Skin Assessment: Reviewed RN Assessment  Last BM:  PTA/unknown  Height:   Ht Readings from Last 1 Encounters:  09/21/19 5\' 4"  (1.626 m)    Weight:   Wt Readings from Last 1 Encounters:  09/20/19 81.6 kg    Ideal Body Weight:  54.5 kg  BMI:  Body mass index is 30.9 kg/m.  Estimated Nutritional Needs:   Kcal:  1741 kcal  Protein:  98-122 grams  Fluid:  >/= 1.8 L/day     Jarome Matin, MS, RD, LDN, CNSC Inpatient Clinical Dietitian RD pager # available in AM[facility]  After hours/weekend pager # available in [facility]

## 2019-09-22 NOTE — Progress Notes (Signed)
RN called E-link about pt's running pressors. E-link confirmed that it was ok to wean and turn off vaso and continue to titrate down Levo as tolerated by pt's bp. RN will continue to titrate pressors as ordered and monitor pt's status and vitals closely.

## 2019-09-22 NOTE — Plan of Care (Signed)
Reviewed the chart, pt is on MV, intubated, PCCM on board . TRH will sign off.    Hosie Poisson, MD

## 2019-09-23 ENCOUNTER — Inpatient Hospital Stay (HOSPITAL_COMMUNITY): Payer: PPO

## 2019-09-23 ENCOUNTER — Ambulatory Visit (HOSPITAL_COMMUNITY): Payer: PPO

## 2019-09-23 LAB — CBC WITH DIFFERENTIAL/PLATELET
Abs Immature Granulocytes: 0.33 10*3/uL — ABNORMAL HIGH (ref 0.00–0.07)
Basophils Absolute: 0 10*3/uL (ref 0.0–0.1)
Basophils Relative: 0 %
Eosinophils Absolute: 0.2 10*3/uL (ref 0.0–0.5)
Eosinophils Relative: 1 %
HCT: 19.8 % — ABNORMAL LOW (ref 36.0–46.0)
Hemoglobin: 6.5 g/dL — CL (ref 12.0–15.0)
Immature Granulocytes: 3 %
Lymphocytes Relative: 7 %
Lymphs Abs: 0.8 10*3/uL (ref 0.7–4.0)
MCH: 30.5 pg (ref 26.0–34.0)
MCHC: 32.8 g/dL (ref 30.0–36.0)
MCV: 93 fL (ref 80.0–100.0)
Monocytes Absolute: 1.1 10*3/uL — ABNORMAL HIGH (ref 0.1–1.0)
Monocytes Relative: 10 %
Neutro Abs: 9.3 10*3/uL — ABNORMAL HIGH (ref 1.7–7.7)
Neutrophils Relative %: 79 %
Platelets: 239 10*3/uL (ref 150–400)
RBC: 2.13 MIL/uL — ABNORMAL LOW (ref 3.87–5.11)
RDW: 21 % — ABNORMAL HIGH (ref 11.5–15.5)
WBC: 11.7 10*3/uL — ABNORMAL HIGH (ref 4.0–10.5)
nRBC: 1.2 % — ABNORMAL HIGH (ref 0.0–0.2)

## 2019-09-23 LAB — COMPREHENSIVE METABOLIC PANEL
ALT: 12 U/L (ref 0–44)
AST: 21 U/L (ref 15–41)
Albumin: 2.2 g/dL — ABNORMAL LOW (ref 3.5–5.0)
Alkaline Phosphatase: 93 U/L (ref 38–126)
Anion gap: 11 (ref 5–15)
BUN: 60 mg/dL — ABNORMAL HIGH (ref 8–23)
CO2: 20 mmol/L — ABNORMAL LOW (ref 22–32)
Calcium: 8.3 mg/dL — ABNORMAL LOW (ref 8.9–10.3)
Chloride: 106 mmol/L (ref 98–111)
Creatinine, Ser: 2.54 mg/dL — ABNORMAL HIGH (ref 0.44–1.00)
GFR calc Af Amer: 22 mL/min — ABNORMAL LOW (ref >60)
GFR calc non Af Amer: 19 mL/min — ABNORMAL LOW (ref >60)
Glucose, Bld: 86 mg/dL (ref 70–99)
Potassium: 3.7 mmol/L (ref 3.5–5.1)
Sodium: 137 mmol/L (ref 135–145)
Total Bilirubin: 1.3 mg/dL — ABNORMAL HIGH (ref 0.3–1.2)
Total Protein: 6 g/dL — ABNORMAL LOW (ref 6.5–8.1)

## 2019-09-23 LAB — GLUCOSE, CAPILLARY
Glucose-Capillary: 102 mg/dL — ABNORMAL HIGH (ref 70–99)
Glucose-Capillary: 102 mg/dL — ABNORMAL HIGH (ref 70–99)
Glucose-Capillary: 102 mg/dL — ABNORMAL HIGH (ref 70–99)
Glucose-Capillary: 104 mg/dL — ABNORMAL HIGH (ref 70–99)
Glucose-Capillary: 119 mg/dL — ABNORMAL HIGH (ref 70–99)
Glucose-Capillary: 157 mg/dL — ABNORMAL HIGH (ref 70–99)
Glucose-Capillary: 54 mg/dL — ABNORMAL LOW (ref 70–99)
Glucose-Capillary: 84 mg/dL (ref 70–99)

## 2019-09-23 LAB — CULTURE, RESPIRATORY W GRAM STAIN: Culture: NORMAL

## 2019-09-23 LAB — PNEUMOCYSTIS JIROVECI SMEAR BY DFA: Pneumocystis jiroveci Ag: NEGATIVE

## 2019-09-23 LAB — LEGIONELLA PNEUMOPHILA SEROGP 1 UR AG: L. pneumophila Serogp 1 Ur Ag: NEGATIVE

## 2019-09-23 LAB — PREPARE RBC (CROSSMATCH)

## 2019-09-23 LAB — MAGNESIUM: Magnesium: 1.8 mg/dL (ref 1.7–2.4)

## 2019-09-23 LAB — PHOSPHORUS: Phosphorus: 3.2 mg/dL (ref 2.5–4.6)

## 2019-09-23 MED ORDER — SODIUM CHLORIDE 0.9% IV SOLUTION: Freq: Once | INTRAVENOUS | Status: DC

## 2019-09-23 MED ORDER — DIPHENHYDRAMINE HCL 50 MG/ML IJ SOLN
25.0000 mg | Freq: Four times a day (QID) | INTRAMUSCULAR | Status: DC | PRN
  Administered 2019-09-23 – 2019-09-27 (×9): 25 mg via INTRAVENOUS
  Filled 2019-09-23 (×9): qty 1

## 2019-09-23 NOTE — Progress Notes (Signed)
eLink Physician-Brief Progress Note Patient Name: Patricia Mata DOB: [birth year] MRN: [id number]   Date of Service  09/23/2019  HPI/Events of Note  Anemia - Hgb = 6.5.   eICU Interventions  Will transfuse 1 unit PRBC,     Intervention Category Major Interventions: Other:  Lysle Dingwall 09/23/2019, 5:56 AM

## 2019-09-23 NOTE — Progress Notes (Addendum)
Patient's son Patricia Mata called and was updated on his mom's status and care. All questions answered.

## 2019-09-23 NOTE — Progress Notes (Signed)
NAME:  Patricia Mata, MRN:  [id number], DOB:  Dec 07, 1955, LOS: 3 ADMISSION DATE:  09/20/2019, CONSULTATION DATE:  09/20/19 REFERRING MD:  Roel Cluck, CHIEF COMPLAINT:  Pneumonia  Brief History   This is a 64 yo with history of IDDM, HTN, CKD, Pulm HTN, gout, colon cancer who presents for recurrence of pna like symptoms after treatment for CAP for 5 days on previous admisison from 3/02-3/05.   History of present illness   This is a 64 yo with history as noted above with history of IDDM, HTN, CKD, Pulm HTN, gout, colon cancer who presents for recurrence of pna like symptoms after treatment for CAP for 5 days on previous admisison from 3/02-3/05. Today presenting with recurrence of pna like symptoms. Increased cough that is green in color. Patient with increased WOB as well. Felt it was getting harder to breath. No increase in weight. No lower extremity edema. Has been taking lasix and sildenafil daily. No fevers but is feeling worse. Smoked for about 15 pack years 20 years ago. No travels recently. Has not lived in [place] country for more than 6 months. Has lived here in [place] whole life. 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 No tb risk factor including homelessness, IVDU or incarceration. Has a dog that lives with her. No birds.   Past Medical History  CAP Anemia in CKD Essential HNT HLD T2DM Hypoxia  Significant Hospital Events   Patient being admitted to stepdown  Consults:  PCCM  Procedures:  09/22/2018 intubation>>  Significant Diagnostic Tests:  CT chest 3/8 > ARDS, air bronchograms c/w PNA, trace effusions, cardiomegaly, left adrenal nodule, hepatomegaly. Echocardiogram   Micro Data:  Sputum 3/8 > not usable RVP 3/8 > neg. COVID 3/8 > neg. 09/22/2019 tracheal aspirate from BAL>> Antimicrobials:  Vancomycin Cefepime  Interim history/subjective:  Currently awake alert following commands on minimal sedation Vasopressor support On weaning protocol Objective    Blood pressure (!) 147/39, pulse (!) 56, temperature 97.8 F (36.6 C), temperature source Axillary, resp. rate 15, height _0  (1.626 m), weight 81.6 kg, SpO2 100 %.    Vent Mode: PSV;CPAP FiO2 (%):  [40 %-55 %] 40 % Set Rate:  [35 bmp] 35 bmp Vt Set:  [430 mL] 430 mL PEEP:  [5 cmH20-15 cmH20] 5 cmH20 Pressure Support:  [5 cmH20] 5 cmH20 Plateau Pressure:  [25 AYO45-99 cmH20] 28 cmH20   Intake/Output Summary (Last 24 hours) at 09/23/2019 0836 Last data filed at 09/23/2019 0636 Gross per 24 hour  Intake 2339.39 ml  Output 950 ml  Net 1389.39 ml   Filed Weights   09/20/19 1741 09/23/19 0500  Weight: 81.6 kg 81.6 kg    Examination: General: Morbidly obese female who is intubated HEENT: Endotracheal tube gastric tube are in place Neuro: Intact follows commands moves all extremities lifts head off the bed CV: Heart sounds are distant PULM: Diminished in the bases Vent currently on CPAP pressure support FIO2 40% PEEP 5 RATE spontaneous rate 20 VT spontaneous tidal volume 482 540   Patient is rotated to the right.  Support apparatus in appropriate position no significant change GI: soft, bsx4 active  GU: Amber urine Extremities: warm/dry, 1+ edema  Skin: no rashes or lesions     Assessment & Plan:  Vent dependent respiratory failure intubated and placed on ARDS protocol 09/21/2018 HAP-Patient has failed CAP coverage ans was just hospitalized on 09/14/19. Given this would classify this as HAP.  Did get treatment for atypical with  azithromycin during recent hospitalization.  Initially some concern for ILD / UIP; however, CT chest not c/w this and more favorable for multifocal PNA + ARDS.  Of note patiuet was admitted for COVID on 03/18/19, had normal pft's on 03/14/19 with normal TLC and FEV/FVC after that was normal but did have decreased DLCO.  ARDS protocol Status post fiberoptic bronchoscopy 09/22/2019 per Dr. Almon Register per protocol   Shock Vasopressors as needed  Holding diuresis Continue antimicrobial therapy   DM CBG (last 3)  Recent Labs    09/22/19 2329 09/23/19 0354 09/23/19 0819  GLUCAP 108* 84 102*   Continue sliding scale insulin protocol  Pulm HTN-Appears to be stable . Most recent right heart cath on 04/23/19 Noted PA pressure of 57/12(32) capillary wedge was 14 which was much improved from right heart cath in 2019 which noted sever PH with mean of 62 (105/40) cappillary wedge of 25.  Intake/Output Summary (Last 24 hours) at 09/23/2019 0841 Last data filed at 09/23/2019 0636 Gross per 24 hour  Intake 2339.39 ml  Output 950 ml  Net 1389.39 ml    Cardiology echo follow-up Diuresis when able  Left adrenal nodule. Will need outpatient follow-up   Best practice:  Diet: Npo for now Pain/Anxiety/Delirium protocol (if indicated): Sedation protocol VAP protocol (if indicated): DVT prophylaxis: Heparin GI prophylaxis: PPI Mobility: Rest Code Status: Code Family Communication: 09/23/2019 husband Chantilly Linskey was updated via phone telephone. Disposition: Sheisintubated   CC time: 45 min.   [name] Sabria Florido ACNP Acute Care Nurse Practitioner [name] Pulmonary/Critical Care Please consult Amion 09/23/2019, 8:36 AM

## 2019-09-23 NOTE — Progress Notes (Signed)
CRITICAL VALUE ALERT  Critical Value:  Hgb 6.5  Date & Time Notied:  09/23/2019  Provider Notified: E-link  Orders Received/Actions taken: awaiting further orders. Will continue to monitor the pt closely.

## 2019-09-23 NOTE — Progress Notes (Signed)
Palliative Care Brief Progress Note  Palliative Care Consult received.  [name] is known to our service from prior encounters.  Since palliative consult was initially placed, she has been intubated and transferred to critical care service.  Discussed with PCCM provider, and with her being intubated overnight, will hold on palliative consult at this point to see how she progresses clinically over the next couple of days.    I attempted to reach out to husband to reintroduce palliative service, but did not reach him this evening.    Will plan to check in with PCCM in a few days to best determine how we can be of assistance in the care of Patricia Mata.  Please reach out for any needs with which we can be of assistance in the interim.  Micheline Rough, MD [facility] [phone]  NO CHARGE NOTE

## 2019-09-23 NOTE — Progress Notes (Signed)
OT Cancellation Note  Patient Details Name: Vanesa Renier MRN: [id number] DOB: Feb 06, 1956   Cancelled Treatment:    Reason Eval/Treat Not Completed: Medical issues which prohibited therapy. Nurse reports patient is weaning off Vent, but has Hgb of 6.5 and requests to attempt evaluation on different date and time. Skilynn Durney OTR/L   Kaytee Taliercio 09/23/2019, 10:02 AM

## 2019-09-23 NOTE — Progress Notes (Signed)
eLink Physician-Brief Progress Note Patient Name: Cheryllynn Sarff DOB: [birth year] MRN: [id number]   Date of Service  09/23/2019  HPI/Events of Note  Itching   eICU Interventions  Will order: 1. Benadryl 25 mg IV Q 6 hours PRN itching.     Intervention Category Major Interventions: Other:  Lysle Dingwall 09/23/2019, 5:18 AM

## 2019-09-23 NOTE — Progress Notes (Signed)
Subjective:   Intubated , sedated   Objective:  Vitals:   09/23/19 0500 09/23/19 0630 09/23/19 0656 09/23/19 0714  BP:  (!) 141/46 (!) 137/94 (!) 147/39  Pulse:   (!) 55 (!) 56  Resp:  15 (!) 34 15  Temp:   97.6 F (36.4 C) 97.8 F (36.6 C)  TempSrc:   Axillary Axillary  SpO2:   100%   Weight: 81.6 kg     Height:        Intake/Output from previous day:  Intake/Output Summary (Last 24 hours) at 09/23/2019 0755 Last data filed at 09/23/2019 0636 Gross per 24 hour  Intake 2339.39 ml  Output 950 ml  Net 1389.39 ml    Physical Exam:  Chronically ill black female  Intubated Right IJ catheter AS murmur  BS positive Pedal pulses intact  Rhonchi anteriorly   Lab Results: Basic Metabolic Panel: Recent Labs    09/22/19 0300 09/23/19 0511  NA 136 137  K 4.5 3.7  CL 104 106  CO2 23 20*  GLUCOSE 265* 86  BUN 59* 60*  CREATININE 2.49* 2.54*  CALCIUM 8.4* 8.3*  MG 1.9 1.8  PHOS 7.0* 3.2   Liver Function Tests: Recent Labs    09/22/19 0300 09/23/19 0511  AST 28 21  ALT 17 12  ALKPHOS 114 93  BILITOT 1.2 1.3*  PROT 6.1* 6.0*  ALBUMIN 2.3* 2.2*    CBC: Recent Labs    09/22/19 0300 09/23/19 0511  WBC 30.2* 11.7*  NEUTROABS 24.8* 9.3*  HGB 7.0* 6.5*  HCT 22.5* 19.8*  MCV 95.3 93.0  PLT 329 239   Cardiac Enzymes: High Sensitivity Troponin:   Recent Labs  Lab 09/20/19 1823 09/20/19 2302 09/21/19 0828 09/21/19 1036  TROPONINIHS 62* 215* 254* 232*      BNP    Component Value Date/Time   BNP 548.2 (H) 09/20/2019 1823    D-Dimer: Recent Labs    09/20/19 1823  DDIMER 2.53*   Hemoglobin A1C: Recent Labs    09/21/19 0056  HGBA1C 6.5*   Fasting Lipid Panel: Recent Labs    09/21/19 2336  TRIG 108   Thyroid Function Tests: Recent Labs    09/21/19 0300  TSH 2.435   Anemia Panel: Recent Labs    09/20/19 1826 09/20/19 2131 09/21/19 0056 09/21/19 0300  VITAMINB12  --   --   --  1,877*  FOLATE  --   --   --  17.3    FERRITIN 1,870*  --   --   --   TIBC  --  187*  --   --   IRON  --  50  --   --   RETICCTPCT  --   --  3.4*  --     Imaging: DG Abd 1 View  Result Date: 09/22/2019 CLINICAL DATA:  64 year old female NG tube placement. EXAM: ABDOMEN - 1 VIEW COMPARISON:  Portable chest 2300 hours yesterday. FINDINGS: Portable AP supine view at 0016 hours. Enteric tube has been placed into the stomach with tip at the level of the gastric antrum. Gas-filled but nondilated visible bowel loops. Coarse and confluent bilateral lower lung opacity again noted. IMPRESSION: Enteric tube placed into the stomach, tip at the gastric antrum. Electronically Signed   By: Genevie Ann M.D.   On: 09/22/2019 00:39   DG Chest Port 1 View  Result Date: 09/23/2019 CLINICAL DATA:  Respiratory failure EXAM: PORTABLE CHEST 1 VIEW COMPARISON:  09/22/2019 FINDINGS: The endotracheal tube  is 5 cm above the carina. The left IJ catheter is stable. The NG tube is stable. Persistent diffuse interstitial and airspace process in the lungs. No definite pleural effusions or pneumothorax. IMPRESSION: 1. Stable support apparatus. 2. Persistent diffuse interstitial and airspace process. Electronically Signed   By: Marijo Sanes M.D.   On: 09/23/2019 07:15   DG Chest Port 1 View  Result Date: 09/22/2019 CLINICAL DATA:  Respiratory failure. EXAM: PORTABLE CHEST 1 VIEW COMPARISON:  09/22/2019 FINDINGS: The endotracheal tube is 3.5 cm above the carina. The left IJ central venous catheter tip is in the mid SVC. The NG tube is coursing down the esophagus and into the stomach. Persistent diffuse interstitial and airspace process in the lungs. No definite pleural effusion or pneumothorax. IMPRESSION: 1. Stable support apparatus. 2. Persistent diffuse interstitial and airspace process. Electronically Signed   By: Marijo Sanes M.D.   On: 09/22/2019 08:24   DG CHEST PORT 1 VIEW  Result Date: 09/22/2019 CLINICAL DATA:  Central line placement EXAM: PORTABLE CHEST 1  VIEW COMPARISON:  September 21, 2019 FINDINGS: The endotracheal tube terminates approximately 4.2 cm above the carina. The left-sided central venous catheter is well position. The enteric tube appears to extend below the left hemidiaphragm. Diffuse bilateral pulmonary opacities are again noted with slight interval improvement. There is no pneumothorax. The heart size is stable from prior study. IMPRESSION: 1. Lines and tubes as above.  No pneumothorax. 2. Persistent bilateral pulmonary opacities as before. Electronically Signed   By: Constance Holster M.D.   On: 09/22/2019 02:10   DG CHEST PORT 1 VIEW  Result Date: 09/21/2019 CLINICAL DATA:  Intubation. EXAM: PORTABLE CHEST 1 VIEW COMPARISON:  September 20, 2019 FINDINGS: An endotracheal tube has been placed, its tip projects 4.0 cm above the carina. There is similar lung inflation. Cardiomegaly, interstitial edema and diffuse alveolar with patchy airspace opacities are redemonstrated. Dense consolidation obscures the cardiac borders and a slightly more confluent. There are new air bronchograms in the left suprahilar region. Aortic knob calcified atherosclerosis and skeletal degenerative changes present. There is partial imaging of gaseous gastric distension. Telemetry leads are present. There is no pneumothorax or obvious pleural effusion. The right costophrenic angle is incompletely imaged. IMPRESSION: Interval intubation, the endotracheal tube tip projecting 4.0 cm above the carina. Slight interval worsening of extensive bilateral pulmonary alveolar and airspace disease, the latter more confluent. Cardiomegaly and thoracic aortic calcified atherosclerosis. Partial imaging of gaseous gastric distension. Electronically Signed   By: Revonda Humphrey   On: 09/21/2019 23:46   ECHOCARDIOGRAM COMPLETE BUBBLE STUDY  Result Date: 09/21/2019    ECHOCARDIOGRAM REPORT   Patient Name:   Patricia Mata Date of Exam: 09/21/2019 Medical Rec #:  [id number]    Height:       64.0 in Accession  #:    [id number]   Weight:       180.0 lb Date of Birth:  08-30-1955     BSA:          1.871 m Patient Age:    64 years     BP:           145/42 mmHg Patient Gender: F            HR:           81 bpm. Exam Location:  Inpatient Procedure: 2D Echo, Color Doppler, Cardiac Doppler and Saline Contrast Bubble            Study Indications:  R94.31 Abnormal EKG  History:        Patient has prior history of Echocardiogram examinations, most                 recent 02/15/2019. Pulmonary HTN; Risk Factors:Hypertension,                 Diabetes and Dyslipidemia. COVID+ 03/18/19.  Sonographer:    Raquel Sarna Senior RDCS Referring Phys: [id number] Tyna Jaksch  Sonographer Comments: Scanned supine on high flow positive pressure nasal cannula IMPRESSIONS  1. Left ventricular ejection fraction, by estimation, is 65 to 70%. The left ventricle has hyperdynamic function. The left ventricle has no regional wall motion abnormalities. Left ventricular diastolic parameters are consistent with Grade II diastolic dysfunction (pseudonormalization). Elevated left atrial pressure.  2. Right ventricular systolic function is normal. The right ventricular size is normal. There is moderately elevated pulmonary artery systolic pressure.  3. Left atrial size was severely dilated.  4. 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 The mean mitral valve gradient is 9.4 mmHg with average heart rate of 81 bpm.  5. The aortic valve has an indeterminant number of cusps. Aortic valve regurgitation is trivial. Severe aortic valve stenosis. Aortic valve area, by VTI measures 0.82 cm. Aortic valve mean gradient measures 42.0 mmHg. Aortic valve Vmax measures 4.32 m/s.  6. The inferior vena cava is normal in size with greater than 50% respiratory variability, suggesting right atrial pressure of 3 mmHg.  7. Agitated saline contrast bubble study was negative, with no evidence of any interatrial shunt.  Comparison(s): Prior images reviewed side by side. Changes from prior study are noted. Increased mitral gradients are partly explained by increased heart rate and cardiac output. FINDINGS  Left Ventricle: Left ventricular ejection fraction, by estimation, is 65 to 70%. The left ventricle has hyperdynamic function. The left ventricle has no regional wall motion abnormalities. The left ventricular internal cavity size was normal in size. There is no left ventricular hypertrophy. Left ventricular diastolic parameters are consistent with Grade II diastolic dysfunction (pseudonormalization). Elevated left atrial pressure. Right Ventricle: The right ventricular size is normal. No increase in right ventricular wall thickness. Right ventricular systolic function is normal. There is moderately elevated pulmonary artery systolic pressure. The tricuspid regurgitant velocity is 3.71 m/s, and with an assumed right atrial pressure of 3 mmHg, the estimated right ventricular systolic pressure is 45.4 mmHg. Left Atrium: Left atrial size was severely dilated. Right Atrium: Right atrial size was normal in size. Pericardium: There is no evidence of pericardial effusion. Mitral Valve: The mitral valve is rheumatic. There is moderate thickening of the mitral valve leaflet(s). Severe mitral annular calcification. Mild to moderate mitral valve regurgitation, with centrally-directed jet. Mild to moderate mitral valve stenosis. MV peak gradient, 25.8 mmHg. The mean mitral valve gradient is 9.4 mmHg with average heart rate of 81 bpm. Tricuspid Valve: The tricuspid valve is normal in structure. Tricuspid valve regurgitation is mild. Aortic Valve: The aortic valve has an indeterminant number of cusps. Aortic valve regurgitation is trivial. Severe aortic stenosis is present. Aortic valve mean gradient measures 42.0 mmHg. Aortic valve peak gradient measures 74.6 mmHg. Aortic valve area, by VTI measures 0.82 cm. Pulmonic Valve: The pulmonic valve  was grossly normal. Pulmonic valve regurgitation is not visualized. Aorta: The aortic root is normal in size and structure. Venous: The inferior vena cava is normal in size with greater than 50% respiratory variability, suggesting right atrial pressure  of 3 mmHg. IAS/Shunts: No atrial level shunt detected by color flow Doppler. Agitated saline contrast was given intravenously to evaluate for intracardiac shunting. Agitated saline contrast bubble study was negative, with no evidence of any interatrial shunt.  LEFT VENTRICLE PLAX 2D LVIDd:         5.00 cm  Diastology LVIDs:         2.90 cm  LV e' lateral:   7.40 cm/s LV PW:         1.20 cm  LV E/e' lateral: 27.0 LV IVS:        1.00 cm  LV e' medial:    5.66 cm/s LVOT diam:     2.20 cm  LV E/e' medial:  35.3 LV SV:         88 LV SV Index:   47 LVOT Area:     3.80 cm  RIGHT VENTRICLE RV S prime:     10.80 cm/s TAPSE (M-mode): 2.2 cm LEFT ATRIUM             Index       RIGHT ATRIUM           Index LA diam:        4.90 cm 2.62 cm/m  RA Area:     21.30 cm LA Vol (A2C):   75.2 ml 40.20 ml/m RA Volume:   62.50 ml  33.41 ml/m LA Vol (A4C):   82.7 ml 44.21 ml/m LA Biplane Vol: 78.9 ml 42.18 ml/m  AORTIC VALVE AV Area (Vmax):    0.90 cm AV Area (Vmean):   0.84 cm AV Area (VTI):     0.82 cm AV Vmax:           432.00 cm/s AV Vmean:          309.000 cm/s AV VTI:            1.070 m AV Peak Grad:      74.6 mmHg AV Mean Grad:      42.0 mmHg LVOT Vmax:         102.00 cm/s LVOT Vmean:        68.200 cm/s LVOT VTI:          0.231 m LVOT/AV VTI ratio: 0.22  AORTA Ao Root diam: 2.70 cm Ao Asc diam:  2.10 cm MITRAL VALVE                TRICUSPID VALVE MV Area (PHT): 3.39 cm     TR Peak grad:   55.1 mmHg MV Peak grad:  25.8 mmHg    TR Vmax:        371.00 cm/s MV Mean grad:  9.4 mmHg MV Vmax:       2.54 m/s     SHUNTS MV Vmean:      140.0 cm/s   Systemic VTI:  0.23 m MV Decel Time: 224 msec     Systemic Diam: 2.20 cm MV E velocity: 200.00 cm/s MV A velocity: 130.00 cm/s MV E/A  ratio:  1.54 Mihai Croitoru MD Electronically signed by Sanda Klein MD Signature Date/Time: 09/21/2019/12:09:35 PM    Final    VAS [place] LOWER EXTREMITY VENOUS (DVT)  Result Date: 09/21/2019  Lower Venous DVTStudy Indications: Hypoxia.  Risk Factors: None identified. Limitations: Poor ultrasound/tissue interface. Comparison Study: No prior studies. Performing Technologist: Oliver Hum RVT  Examination Guidelines: A complete evaluation includes B-mode imaging, spectral Doppler, color Doppler, and power Doppler as needed of all accessible portions of each vessel.  Bilateral testing is considered an integral part of a complete examination. Limited examinations for reoccurring indications may be performed as noted. The reflux portion of the exam is performed with the patient in reverse Trendelenburg.  +---------+---------------+---------+-----------+----------+--------------+  RIGHT     Compressibility Phasicity Spontaneity Properties Thrombus Aging  +---------+---------------+---------+-----------+----------+--------------+  CFV       Full            Yes       Yes                                    +---------+---------------+---------+-----------+----------+--------------+  SFJ       Full                                                             +---------+---------------+---------+-----------+----------+--------------+  FV Prox   Full                                                             +---------+---------------+---------+-----------+----------+--------------+  FV Mid    Full                                                             +---------+---------------+---------+-----------+----------+--------------+  FV Distal Full                                                             +---------+---------------+---------+-----------+----------+--------------+  PFV       Full                                                              +---------+---------------+---------+-----------+----------+--------------+  POP       Full            Yes       Yes                                    +---------+---------------+---------+-----------+----------+--------------+  PTV       Full                                                             +---------+---------------+---------+-----------+----------+--------------+  PERO      Full                                                             +---------+---------------+---------+-----------+----------+--------------+   +---------+---------------+---------+-----------+----------+--------------+  LEFT      Compressibility Phasicity Spontaneity Properties Thrombus Aging  +---------+---------------+---------+-----------+----------+--------------+  CFV       Full            Yes       Yes                                    +---------+---------------+---------+-----------+----------+--------------+  SFJ       Full                                                             +---------+---------------+---------+-----------+----------+--------------+  FV Prox   Full                                                             +---------+---------------+---------+-----------+----------+--------------+  FV Mid    Full                                                             +---------+---------------+---------+-----------+----------+--------------+  FV Distal Full                                                             +---------+---------------+---------+-----------+----------+--------------+  PFV       Full                                                             +---------+---------------+---------+-----------+----------+--------------+  POP       Full            Yes       Yes                                    +---------+---------------+---------+-----------+----------+--------------+  PTV       Full                                                              +---------+---------------+---------+-----------+----------+--------------+  PERO      Full                                                             +---------+---------------+---------+-----------+----------+--------------+  Summary: RIGHT: - There is no evidence of deep vein thrombosis in the lower extremity.  - No cystic structure found in the popliteal fossa.  LEFT: - There is no evidence of deep vein thrombosis in the lower extremity.  - No cystic structure found in the popliteal fossa.  *See table(s) above for measurements and observations. Electronically signed by Harold Barban MD on 09/21/2019 at 5:49:31 PM.    Final     Cardiac Studies:  ECG:  SR rate 79 nonspecific ST changes    Telemetry: ? Sinus or junctional with long PR    Echo: EF 65-70% mild/moderate MR mild MS mean gradient 9 mmHg severe AS mean gradient 42 mmHg  Medications:    sodium chloride   Intravenous Once   allopurinol  100 mg Oral Daily   atorvastatin  40 mg Oral q1800   chlorhexidine gluconate (MEDLINE KIT)  15 mL Mouth Rinse BID   Chlorhexidine Gluconate Cloth  6 each Topical Daily   heparin injection (subcutaneous)  5,000 Units Subcutaneous Q8H   insulin aspart  0-20 Units Subcutaneous Q4H   insulin glargine  5 Units Subcutaneous Q2200   latanoprost  1 drop Both Eyes QHS   levothyroxine  88 mcg Oral Q0600   mouth rinse  15 mL Mouth Rinse 10 times per day   sildenafil  20 mg Oral TID   sodium chloride flush  10-40 mL Intracatheter Q12H   sodium chloride flush  3 mL Intravenous Q12H      sodium chloride Stopped (09/21/19 0756)   sodium chloride     ceFEPime (MAXIPIME) IV Stopped (09/22/19 1749)   feeding supplement (VITAL HIGH PROTEIN) 1,000 mL (09/22/19 1236)   fentaNYL infusion INTRAVENOUS 75 mcg/hr (09/23/19 0636)   norepinephrine (LEVOPHED) Adult infusion Stopped (09/23/19 0247)   propofol (DIPRIVAN) infusion Stopped (09/22/19 1913)   vancomycin Stopped (09/22/19 1252)    vasopressin (PITRESSIN) infusion - *FOR SHOCK* Stopped (09/22/19 2108)    Assessment/Plan:    Respiratory Failure:    History of pulmonary hypertension , COVID pneumonia 04/04/19, CAP March 2021 and now Likely hospital aquired pneumonia CT chest with ARDS multi focal pneumonia Intubated again  plan per CCM WBC down from 30.2 to 11.7 Rx Vanc and Cefepime CXR this am with good ETT position and persistent diffuse interstitial airspace process consistent with ARDS/ multi focal pneumonia  Valve Disease:    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 MDS Amiodarone d/c but Doubt acute/chronic lung toxicity from this. Telemetry this am ? Junctional with retrograde P confirmed by ECG yesterday Rates in 50's and hemodynamics stable Not on any AV nodal blocking drugs will observe  K 4.5 yesterday to be repeated today would Rx for hyperkalemia if >5 given rhythm   Anemia:  Hb 6.5 getting transfusion at this time   Jenkins Rouge MD [facility]

## 2019-09-23 NOTE — Progress Notes (Signed)
PT Cancellation Note  Patient Details Name: Kortni Hasten MRN: [id number] DOB: [birth year]   Cancelled Treatment:    Reason Eval/Treat Not Completed: Medical issues which prohibited therapy, on vent, HGB low . Check back when medically stable.   Claretha Cooper 09/23/2019, 3:36 PM  [place] Pager [phone] Office [phone]

## 2019-09-24 ENCOUNTER — Inpatient Hospital Stay (HOSPITAL_COMMUNITY): Payer: PPO

## 2019-09-24 ENCOUNTER — Encounter (HOSPITAL_COMMUNITY): Payer: BC Managed Care – PPO | Admitting: Internal Medicine

## 2019-09-24 DIAGNOSIS — D631 Anemia in chronic kidney disease: Secondary | ICD-10-CM

## 2019-09-24 DIAGNOSIS — N183 Chronic kidney disease, stage 3 unspecified: Secondary | ICD-10-CM

## 2019-09-24 LAB — CBC WITH DIFFERENTIAL/PLATELET
Abs Immature Granulocytes: 0.6 10*3/uL — ABNORMAL HIGH (ref 0.00–0.07)
Basophils Absolute: 0 10*3/uL (ref 0.0–0.1)
Basophils Relative: 0 %
Eosinophils Absolute: 0.1 10*3/uL (ref 0.0–0.5)
Eosinophils Relative: 1 %
HCT: 21.6 % — ABNORMAL LOW (ref 36.0–46.0)
Hemoglobin: 6.9 g/dL — CL (ref 12.0–15.0)
Immature Granulocytes: 5 %
Lymphocytes Relative: 8 %
Lymphs Abs: 0.9 10*3/uL (ref 0.7–4.0)
MCH: 29.5 pg (ref 26.0–34.0)
MCHC: 31.9 g/dL (ref 30.0–36.0)
MCV: 92.3 fL (ref 80.0–100.0)
Monocytes Absolute: 1.1 10*3/uL — ABNORMAL HIGH (ref 0.1–1.0)
Monocytes Relative: 10 %
Neutro Abs: 8.8 10*3/uL — ABNORMAL HIGH (ref 1.7–7.7)
Neutrophils Relative %: 76 %
Platelets: 213 10*3/uL (ref 150–400)
RBC: 2.34 MIL/uL — ABNORMAL LOW (ref 3.87–5.11)
RDW: 20.9 % — ABNORMAL HIGH (ref 11.5–15.5)
WBC: 11.6 10*3/uL — ABNORMAL HIGH (ref 4.0–10.5)
nRBC: 1.8 % — ABNORMAL HIGH (ref 0.0–0.2)

## 2019-09-24 LAB — TYPE AND SCREEN
ABO/RH(D): A NEG
Antibody Screen: NEGATIVE
Unit division: 0

## 2019-09-24 LAB — COMPREHENSIVE METABOLIC PANEL
ALT: 9 U/L (ref 0–44)
AST: 18 U/L (ref 15–41)
Albumin: 2.1 g/dL — ABNORMAL LOW (ref 3.5–5.0)
Alkaline Phosphatase: 106 U/L (ref 38–126)
Anion gap: 10 (ref 5–15)
BUN: 68 mg/dL — ABNORMAL HIGH (ref 8–23)
CO2: 20 mmol/L — ABNORMAL LOW (ref 22–32)
Calcium: 8.6 mg/dL — ABNORMAL LOW (ref 8.9–10.3)
Chloride: 110 mmol/L (ref 98–111)
Creatinine, Ser: 2.45 mg/dL — ABNORMAL HIGH (ref 0.44–1.00)
GFR calc Af Amer: 23 mL/min — ABNORMAL LOW (ref >60)
GFR calc non Af Amer: 20 mL/min — ABNORMAL LOW (ref >60)
Glucose, Bld: 131 mg/dL — ABNORMAL HIGH (ref 70–99)
Potassium: 3.9 mmol/L (ref 3.5–5.1)
Sodium: 140 mmol/L (ref 135–145)
Total Bilirubin: 1 mg/dL (ref 0.3–1.2)
Total Protein: 5.7 g/dL — ABNORMAL LOW (ref 6.5–8.1)

## 2019-09-24 LAB — GLUCOSE, CAPILLARY
Glucose-Capillary: 126 mg/dL — ABNORMAL HIGH (ref 70–99)
Glucose-Capillary: 109 mg/dL — ABNORMAL HIGH (ref 70–99)
Glucose-Capillary: 161 mg/dL — ABNORMAL HIGH (ref 70–99)
Glucose-Capillary: 154 mg/dL — ABNORMAL HIGH (ref 70–99)
Glucose-Capillary: 154 mg/dL — ABNORMAL HIGH (ref 70–99)
Glucose-Capillary: 173 mg/dL — ABNORMAL HIGH (ref 70–99)

## 2019-09-24 LAB — BPAM RBC
Blood Product Expiration Date: 202103292359
ISSUE DATE / TIME: 202103110651
Unit Type and Rh: 600

## 2019-09-24 LAB — CYTOLOGY - NON PAP

## 2019-09-24 LAB — MAGNESIUM: Magnesium: 1.8 mg/dL (ref 1.7–2.4)

## 2019-09-24 LAB — CULTURE, BAL-QUANTITATIVE W GRAM STAIN
Culture: NO GROWTH
Special Requests: NORMAL

## 2019-09-24 LAB — NOVEL CORONAVIRUS, NAA (HOSP ORDER, SEND-OUT TO REF LAB; TAT 18-24 HRS): SARS-CoV-2, NAA: NOT DETECTED

## 2019-09-24 LAB — PHOSPHORUS: Phosphorus: 2.6 mg/dL (ref 2.5–4.6)

## 2019-09-24 MED ORDER — FUROSEMIDE 10 MG/ML IJ SOLN
40.0000 mg | Freq: Once | INTRAMUSCULAR | Status: AC
  Administered 2019-09-24: 40 mg via INTRAVENOUS
  Filled 2019-09-24: qty 4

## 2019-09-24 MED ORDER — SENNOSIDES 8.8 MG/5ML PO SYRP
5.0000 mL | ORAL_SOLUTION | Freq: Two times a day (BID) | ORAL | Status: DC
  Administered 2019-09-24 – 2019-10-03 (×15): 5 mL via ORAL
  Filled 2019-09-24 (×20): qty 5

## 2019-09-24 NOTE — Progress Notes (Addendum)
NAME:  Patricia Mata, MRN:  [id number], DOB:  [birth year], LOS: 4 ADMISSION DATE:  09/20/2019, CONSULTATION DATE:  09/20/19 REFERRING MD:  Patricia Mata, CHIEF COMPLAINT:  Pneumonia  Brief History   This is a 64 yo with history of IDDM, HTN, CKD, Pulm HTN, gout, colon cancer who presents for recurrence of pna like symptoms after treatment for CAP for 5 days on previous admisison from 3/02-3/05.   History of present illness   This is a 64 yo with history as noted above with history of IDDM, HTN, CKD, Pulm HTN, gout, colon cancer who presents for recurrence of pna like symptoms after treatment for CAP for 5 days on previous admisison from 3/02-3/05. Today presenting with recurrence of pna like symptoms. Increased cough that is green in color. Patient with increased WOB as well. Felt it was getting harder to breath. No increase in weight. No lower extremity edema. Has been taking lasix and sildenafil daily. No fevers but is feeling worse. Smoked for about 15 pack years 20 years ago. No travels recently. Has not lived in [place] country for more than 6 months. Has lived here in [place] whole life. 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 No tb risk factor including homelessness, IVDU or incarceration. Has a dog that lives with her. No birds.   Past Medical History  CAP Anemia in CKD Essential HNT HLD T2DM Hypoxia  Significant Hospital Events   Patient being admitted to stepdown  Consults:  PCCM  Procedures:  09/22/2018 intubation>>  Significant Diagnostic Tests:  CT chest 3/8 > ARDS, air bronchograms c/w PNA, trace effusions, cardiomegaly, left adrenal nodule, hepatomegaly. Echocardiogram   Micro Data:  Sputum 3/8 > not usable RVP 3/8 > neg. COVID 3/8 > neg. 09/22/2019 tracheal aspirate from BAL>> Antimicrobials:  Vancomycin Cefepime  Interim history/subjective:  Currently on 40% FiO2 and 10 of pressure support 5 PEEP following commands. Objective   Blood pressure 115/83,  pulse (!) 55, temperature 98.5 F (36.9 C), temperature source Axillary, resp. rate (!) 35, height _0  (1.626 m), weight 90.2 kg, SpO2 93 %.    Vent Mode: PSV;CPAP FiO2 (%):  [40 %] 40 % Set Rate:  [35 bmp] 35 bmp Vt Set:  [430 mL] 430 mL PEEP:  [5 cmH20] 5 cmH20 Pressure Support:  [5 cmH20-10 cmH20] 10 cmH20 Plateau Pressure:  [21 cmH20-25 cmH20] 25 cmH20   Intake/Output Summary (Last 24 hours) at 09/24/2019 0746 Last data filed at 09/24/2019 0700 Gross per 24 hour  Intake 2113.91 ml  Output 1100 ml  Net 1013.91 ml   Filed Weights   09/20/19 1741 09/23/19 0500 09/24/19 0500  Weight: 81.6 kg 81.6 kg 90.2 kg    Examination: General: Obese female currently on pressure support awake and alert. HEENT: Endotracheal tube gastric tube are in place Neuro: Sedated with fentanyl but does follow commands CV: Heart sounds are regular regular rate and rhythm PULM: Diminished breath sounds throughout Vent pressure regulated volume control FIO2 40% PEEP 5 RATE spontaneous 21 VT spontaneous 519 on tenderness pressure support 5 of PEEP   Chest x-ray reviewed 09/24/2019 no significant changes support apparatus is well approximated.  Multifocal pneumonia is still obvious. GI: soft, bsx4 active  GU: Amber urine Extremities: warm/dry, 1+ edema  Skin: no rashes or lesions     Assessment & Plan:  Vent dependent respiratory failure intubated and placed on ARDS protocol 09/21/2018 HAP-Patient has failed CAP coverage ans was just hospitalized on 09/14/19. Given this would  classify this as HAP.  Did get treatment for atypical with azithromycin during recent hospitalization.  Initially some concern for ILD / UIP; however, CT chest not c/w this and more favorable for multifocal PNA + ARDS.  Of note patiuet was admitted for COVID on 03/18/19, had normal pft's on 03/14/19 with normal TLC and FEV/FVC after that was normal but did have decreased DLCO.  Wean per protocol Post fiberoptic bronchoscopy on  09/22/2019 no growth on culture Pulmonary toilet Lasix for 1 dose with careful monitoring of BUN/creatinine  Shock Currently off vasopressors with adequate blood pressure   DM CBG (last 3)  Recent Labs    09/23/19 2015 09/23/19 2345 09/24/19 0355  GLUCAP 102* 104* 126*   Sliding scale insulin protocol  Pulm HTN-Appears to be stable . Most recent right heart cath on 04/23/19 Noted PA pressure of 57/12(32) capillary wedge was 14 which was much improved from right heart cath in 2019 which noted sever PH with mean of 62 (105/40) cappillary wedge of 25.  Intake/Output Summary (Last 24 hours) at 09/24/2019 0746 Last data filed at 09/24/2019 0700 Gross per 24 hour  Intake 2113.91 ml  Output 1100 ml  Net 1013.91 ml    Cardiology follow-up Diuresis when able note creatinine is 2.45 and BUN is 64  Anemia in the setting of myeloproliferative disorder Recent Labs    09/23/19 0511 09/24/19 0610  HGB 6.5* 6.9*   09/24/2018 note  low hemoglobin 6.9 we will continue to monitor at this time.  She is receiving Lasix this may create hemoconcentration not requiring transfusion at this time.  Left adrenal nodule. Will need outpatient follow-up   Best practice:  Diet: Npo for now Pain/Anxiety/Delirium protocol (if indicated): Sedation protocol VAP protocol (if indicated): DVT prophylaxis: Heparin GI prophylaxis: PPI Mobility: Rest Code Status: Code Family Communication: 09/23/2019 husband Patricia Mata was updated via phone telephone.  Her husband is at the bedside on a daily basis and updated.  09/24/2019 attempted to call hospital cell phone and home phone number but unable to establish contact.  Again he will be updated at bedside when he arrives. Disposition: She is intubated   CC time: 35 min.   Patricia Mata ACNP Acute Care Nurse Practitioner Patricia Mata Pulmonary/Critical Care Please consult Amion 09/24/2019, 7:46 AM

## 2019-09-24 NOTE — Progress Notes (Signed)
PT Cancellation Note  Patient Details Name: Patricia Mata MRN: [id number] DOB: 03-May-1956   Cancelled Treatment:     PT deferred this date - pt continues on vent.  RN advises possible extubation this date.  Will follow.  Debe Coder PT Acute Rehabilitation Services Pager [phone] Office [phone]    [facility] 09/24/2019, 9:16 AM

## 2019-09-24 NOTE — Progress Notes (Signed)
Subjective:   Intubated more alert only on low PEEP   Objective:  Vitals:   09/24/19 0729 09/24/19 0800 09/24/19 0811 09/24/19 1000  BP:  (!) 139/31  (!) 121/28  Pulse:  65    Resp:  14  15  Temp:   98.5 F (36.9 C)   TempSrc:   Oral   SpO2: 93% 93%    Weight:      Height:        Intake/Output from previous day:  Intake/Output Summary (Last 24 hours) at 09/24/2019 1030 Last data filed at 09/24/2019 1000 Gross per 24 hour  Intake 1598.29 ml  Output 1100 ml  Net 498.29 ml    Physical Exam:  Chronically ill black female  Intubated Right IJ catheter AS murmur  BS positive Pedal pulses intact  Rhonchi anteriorly   Lab Results: Basic Metabolic Panel: Recent Labs    09/23/19 0511 09/24/19 0610  NA 137 140  K 3.7 3.9  CL 106 110  CO2 20* 20*  GLUCOSE 86 131*  BUN 60* 68*  CREATININE 2.54* 2.45*  CALCIUM 8.3* 8.6*  MG 1.8 1.8  PHOS 3.2 2.6   Liver Function Tests: Recent Labs    09/23/19 0511 09/24/19 0610  AST 21 18  ALT 12 9  ALKPHOS 93 106  BILITOT 1.3* 1.0  PROT 6.0* 5.7*  ALBUMIN 2.2* 2.1*    CBC: Recent Labs    09/23/19 0511 09/24/19 0610  WBC 11.7* 11.6*  NEUTROABS 9.3* 8.8*  HGB 6.5* 6.9*  HCT 19.8* 21.6*  MCV 93.0 92.3  PLT 239 213   Cardiac Enzymes: High Sensitivity Troponin:   Recent Labs  Lab 09/20/19 1823 09/20/19 2302 09/21/19 0828 09/21/19 1036  TROPONINIHS 62* 215* 254* 232*      BNP    Component Value Date/Time   BNP 548.2 (H) 09/20/2019 1823    D-Dimer: No results for input(s): DDIMER in the last 72 hours. Hemoglobin A1C: No results for input(s): HGBA1C in the last 72 hours. Fasting Lipid Panel: Recent Labs    09/21/19 2336  TRIG 108   Thyroid Function Tests: No results for input(s): TSH, T4TOTAL, T3FREE, THYROIDAB in the last 72 hours.  Invalid input(s): FREET3 Anemia Panel: No results for input(s): VITAMINB12, FOLATE, FERRITIN, TIBC, IRON, RETICCTPCT in the last 72 hours.  Imaging: DG  Chest Port 1 View  Result Date: 09/24/2019 CLINICAL DATA:  Respiratory failure EXAM: PORTABLE CHEST 1 VIEW COMPARISON:  Yesterday FINDINGS: ETT tip is at the clavicular heads. Left IJ line with tip partially obscured by EKG leads, tip at the SVC. The enteric tube tip at least reaches the stomach. Low volume chest with hazy opacity on both sides, airspace disease by CT. No change from before. No visible effusion or pneumothorax. IMPRESSION: Unchanged hardware positioning and multifocal pneumonia. Electronically Signed   By: Monte Fantasia M.D.   On: 09/24/2019 07:19   DG Chest Port 1 View  Result Date: 09/23/2019 CLINICAL DATA:  Respiratory failure EXAM: PORTABLE CHEST 1 VIEW COMPARISON:  09/22/2019 FINDINGS: The endotracheal tube is 5 cm above the carina. The left IJ catheter is stable. The NG tube is stable. Persistent diffuse interstitial and airspace process in the lungs. No definite pleural effusions or pneumothorax. IMPRESSION: 1. Stable support apparatus. 2. Persistent diffuse interstitial and airspace process. Electronically Signed   By: Marijo Sanes M.D.   On: 09/23/2019 07:15    Cardiac Studies:  ECG:  SR rate 79 nonspecific ST changes  Telemetry: ? Sinus or junctional with long PR    Echo: EF 65-70% mild/moderate MR mild MS mean gradient 9 mmHg severe AS mean gradient 42 mmHg  Medications:   . sodium chloride   Intravenous Once  . allopurinol  100 mg Oral Daily  . atorvastatin  40 mg Oral q1800  . chlorhexidine gluconate (MEDLINE KIT)  15 mL Mouth Rinse BID  . Chlorhexidine Gluconate Cloth  6 each Topical Daily  . heparin injection (subcutaneous)  5,000 Units Subcutaneous Q8H  . insulin aspart  0-20 Units Subcutaneous Q4H  . insulin glargine  5 Units Subcutaneous Q2200  . latanoprost  1 drop Both Eyes QHS  . levothyroxine  88 mcg Oral Q0600  . mouth rinse  15 mL Mouth Rinse 10 times per day  . sildenafil  20 mg Oral TID  . sodium chloride flush  10-40 mL Intracatheter Q12H   . sodium chloride flush  3 mL Intravenous Q12H     . sodium chloride Stopped (09/21/19 0756)  . sodium chloride    . ceFEPime (MAXIPIME) IV Stopped (09/23/19 1741)  . feeding supplement (VITAL HIGH PROTEIN) 1,000 mL (09/23/19 2000)  . fentaNYL infusion INTRAVENOUS 75 mcg/hr (09/24/19 1000)  . 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 MDS Amiodarone d/c but Doubt acute/chronic lung toxicity from this. Telemetry this am ? Junctional with retrograde P confirmed by ECG yesterday Rates in 50's and hemodynamics stable Not on any AV nodal blocking drugs will observe  K 3.9  Today  ECG 09/22/19 SB with first degree rate 54   Anemia:  Hb 6.9 today post transfusion not much bump in number   Jenkins Rouge MD [facility]

## 2019-09-24 NOTE — Progress Notes (Signed)
Levo restarted due to low blood pressure: arterial pressure 78/28(43)

## 2019-09-24 NOTE — Progress Notes (Signed)
OT Cancellation Note  Patient Details Name: Patricia Mata MRN: [id number] DOB: May 16, 1956   Cancelled Treatment:    Reason Eval/Treat Not Completed: Medical issues which prohibited therapy. Patient continues on vent.  RN advises possible extubation this date.  Will attempt OT evaluation on different date and time.    Saki Legore 09/24/2019, 2:31 PM

## 2019-09-25 ENCOUNTER — Inpatient Hospital Stay (HOSPITAL_COMMUNITY): Payer: PPO

## 2019-09-25 LAB — CBC WITH DIFFERENTIAL/PLATELET
Abs Immature Granulocytes: 1.58 10*3/uL — ABNORMAL HIGH (ref 0.00–0.07)
Basophils Absolute: 0.1 10*3/uL (ref 0.0–0.1)
Basophils Relative: 1 %
Eosinophils Absolute: 0.2 10*3/uL (ref 0.0–0.5)
Eosinophils Relative: 1 %
HCT: 22.8 % — ABNORMAL LOW (ref 36.0–46.0)
Hemoglobin: 7.3 g/dL — ABNORMAL LOW (ref 12.0–15.0)
Immature Granulocytes: 8 %
Lymphocytes Relative: 7 %
Lymphs Abs: 1.4 10*3/uL (ref 0.7–4.0)
MCH: 29.6 pg (ref 26.0–34.0)
MCHC: 32 g/dL (ref 30.0–36.0)
MCV: 92.3 fL (ref 80.0–100.0)
Monocytes Absolute: 1.8 10*3/uL — ABNORMAL HIGH (ref 0.1–1.0)
Monocytes Relative: 10 %
Neutro Abs: 14 10*3/uL — ABNORMAL HIGH (ref 1.7–7.7)
Neutrophils Relative %: 73 %
Platelets: 240 10*3/uL (ref 150–400)
RBC: 2.47 MIL/uL — ABNORMAL LOW (ref 3.87–5.11)
RDW: 20.9 % — ABNORMAL HIGH (ref 11.5–15.5)
WBC: 19 10*3/uL — ABNORMAL HIGH (ref 4.0–10.5)
nRBC: 1.9 % — ABNORMAL HIGH (ref 0.0–0.2)

## 2019-09-25 LAB — ACID FAST SMEAR (AFB, MYCOBACTERIA): Acid Fast Smear: NEGATIVE

## 2019-09-25 LAB — BASIC METABOLIC PANEL
Anion gap: 9 (ref 5–15)
BUN: 87 mg/dL — ABNORMAL HIGH (ref 8–23)
CO2: 20 mmol/L — ABNORMAL LOW (ref 22–32)
Calcium: 8.7 mg/dL — ABNORMAL LOW (ref 8.9–10.3)
Chloride: 110 mmol/L (ref 98–111)
Creatinine, Ser: 2.57 mg/dL — ABNORMAL HIGH (ref 0.44–1.00)
GFR calc Af Amer: 22 mL/min — ABNORMAL LOW (ref >60)
GFR calc non Af Amer: 19 mL/min — ABNORMAL LOW (ref >60)
Glucose, Bld: 214 mg/dL — ABNORMAL HIGH (ref 70–99)
Potassium: 4.2 mmol/L (ref 3.5–5.1)
Sodium: 139 mmol/L (ref 135–145)

## 2019-09-25 LAB — CULTURE, BLOOD (SINGLE)
Culture: NO GROWTH
Special Requests: ADEQUATE

## 2019-09-25 LAB — MAGNESIUM: Magnesium: 1.8 mg/dL (ref 1.7–2.4)

## 2019-09-25 LAB — PHOSPHORUS: Phosphorus: 2.7 mg/dL (ref 2.5–4.6)

## 2019-09-25 LAB — GLUCOSE, CAPILLARY
Glucose-Capillary: 179 mg/dL — ABNORMAL HIGH (ref 70–99)
Glucose-Capillary: 215 mg/dL — ABNORMAL HIGH (ref 70–99)
Glucose-Capillary: 161 mg/dL — ABNORMAL HIGH (ref 70–99)
Glucose-Capillary: 121 mg/dL — ABNORMAL HIGH (ref 70–99)
Glucose-Capillary: 150 mg/dL — ABNORMAL HIGH (ref 70–99)

## 2019-09-25 MED ORDER — FUROSEMIDE 10 MG/ML IJ SOLN
40.0000 mg | Freq: Once | INTRAMUSCULAR | Status: AC
  Administered 2019-09-25: 40 mg via INTRAVENOUS

## 2019-09-25 MED ORDER — FUROSEMIDE 10 MG/ML IJ SOLN
INTRAMUSCULAR | Status: AC
  Filled 2019-09-25: qty 4

## 2019-09-25 MED ORDER — IPRATROPIUM-ALBUTEROL 0.5-2.5 (3) MG/3ML IN SOLN
3.0000 mL | Freq: Four times a day (QID) | RESPIRATORY_TRACT | Status: DC
  Administered 2019-09-25 – 2019-09-28 (×10): 3 mL via RESPIRATORY_TRACT
  Filled 2019-09-25 (×10): qty 3

## 2019-09-25 NOTE — Progress Notes (Signed)
Held CPT for Patient . Patient was not ready

## 2019-09-25 NOTE — Progress Notes (Signed)
She is awake and alert  Increased work of breathing Desaturating, requiring increased FiO2  Lasix 40 mg IV x1 Start breathing treatments BiPAP 12/5, may be titrated higher as needed for work of breathing and saturations  She is very alert, mentating well

## 2019-09-25 NOTE — Progress Notes (Signed)
NAME:  Patricia Mata, MRN:  [id number], DOB:  [birth year], LOS: 5 ADMISSION DATE:  09/20/2019, CONSULTATION DATE:  09/20/19 REFERRING MD:  Roel Cluck, CHIEF COMPLAINT:  Pneumonia  Brief History   This is a 64 yo with history of IDDM, HTN, CKD, Pulm HTN, gout, colon cancer who presents for recurrence of pna like symptoms after treatment for CAP for 5 days on previous admisison from 3/02-3/05.   History of present illness   This is a 64 yo with history as noted above with history of IDDM, HTN, CKD, Pulm HTN, gout, colon cancer who presents for recurrence of pna like symptoms after treatment for CAP for 5 days on previous admisison from 3/02-3/05. Today presenting with recurrence of pna like symptoms. Increased cough that is green in color. Patient with increased WOB as well. Felt it was getting harder to breath. No increase in weight. No lower extremity edema. Has been taking lasix and sildenafil daily. No fevers but is feeling worse. Smoked for about 15 pack years 20 years ago. No travels recently. Has not lived in [place] country for more than 6 months. Has lived here in [place] whole life. 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 No tb risk factor including homelessness, IVDU or incarceration. Has a dog that lives with her. No birds.   Past Medical History  CAP Anemia in CKD Essential HNT HLD T2DM Hypoxia  Significant Hospital Events   Patient being admitted to stepdown  Consults:  PCCM  Procedures:  09/22/2018 intubation>>  Significant Diagnostic Tests:  CT chest 3/8 > ARDS, air bronchograms c/w PNA, trace effusions, cardiomegaly, left adrenal nodule, hepatomegaly. Echocardiogram   Micro Data:  Sputum 3/8 > not usable RVP 3/8 > neg. COVID 3/8 > neg. 09/22/2019 tracheal aspirate from BAL>>  Antimicrobials:  Vancomycin 3/8 Cefepime 3/8>> Ceftriaxone 3/4   Interim history/subjective:  Currently on 40% FiO2 and 10 of pressure support 5 PEEP following  commands. Tolerating pressure support 8/5 Awake and interactive Motioning for the tube to be taken out  Objective   Blood pressure (!) 152/33, pulse (!) 56, temperature 98 F (36.7 C), resp. rate (!) 35, height _0  (1.626 m), weight 92.9 kg, SpO2 98 %.    Vent Mode: PSV;CPAP FiO2 (%):  [30 %-40 %] 30 % Set Rate:  [35 bmp] 35 bmp Vt Set:  [430 mL] 430 mL PEEP:  [5 cmH20] 5 cmH20 Pressure Support:  [12 cmH20] 12 cmH20 Plateau Pressure:  [22 cmH20-26 cmH20] 22 cmH20   Intake/Output Summary (Last 24 hours) at 09/25/2019 0935 Last data filed at 09/25/2019 0732 Gross per 24 hour  Intake 689.43 ml  Output 600 ml  Net 89.43 ml   Filed Weights   09/23/19 0500 09/24/19 0500 09/25/19 0427  Weight: 81.6 kg 90.2 kg 92.9 kg    Examination: General: Obese, appears comfortable HEENT: ET tube in place  neuro: Sedated, arouses very easily CV: S1-S2 appreciated PULM: Diminished breath sounds throughout Vent pressure regulated volume control FIO2 40% PEEP 5 RATE spontaneous 21 VT spontaneous 519 on tenderness pressure support 5 of PEEP   Chest x-ray reviewed 09/25/2019 no significant changes support apparatus is well approximated.  Still with multifocal infiltrate-reviewed by myself GI: soft, bsx4 active  GU: Amber urine Extremities: warm/dry, 1+ edema  Skin: no rashes or lesions   Assessment & Plan:  Feels community-acquired pneumonia, healthcare associated pneumonia as she was recently in the hospital ARDS Treated for Covid pneumonia 03/18/2019 Normal PFT 03/14/2019-DLCO was  decreased on the study -Continue weaning -Post bronchoscopy 09/22/2019 with no growth -Continue pulmonary toileting  Shock -Has been off pressors  DM CBG (last 3)  Recent Labs    09/24/19 2304 09/25/19 0316 09/25/19 0800  GLUCAP 173* 179* 215*   Sliding scale insulin protocol  Pulmonary hypertension -Most recent right heart cath on 04/23/2019 noted PA pressure of 57/12(32), wedge was 14 -This is  improved from a cardiac cath in 2019 showing mean pressure of 62 (105/40) with a wedge of 25 -Continue monitoring -Cardiology following -Cautious diuresis   Anemia in the setting of myeloproliferative disorder Recent Labs    09/24/19 0610 09/25/19 0448  HGB 6.9* 7.3*   09/25/2018 note  low hemoglobin 6.9 on 3/12 we will continue to monitor at this time.  She is receiving Lasix this may create hemoconcentration not requiring transfusion at this time.  Left adrenal nodule. Will need outpatient follow-up   Best practice:  Diet: Npo for now Pain/Anxiety/Delirium protocol (if indicated): Sedation protocol VAP protocol (if indicated): DVT prophylaxis: Heparin GI prophylaxis: PPI Mobility: Rest Code Status: Code Family Communication: 09/23/2019 husband Alveena Taira was updated via phone telephone.  Her husband is at the bedside on a daily basis and updated.  09/24/2019 attempted to call hospital cell phone and home phone number but unable to establish contact.  Again he will be updated at bedside when he arrives. Disposition: She is intubated  The patient is critically ill with multiple organ systems failure and requires high complexity decision making for assessment and support, frequent evaluation and titration of therapies, application of advanced monitoring technologies and extensive interpretation of multiple databases. Critical Care Time devoted to patient care services described in this note independent of APP/resident time (if applicable)  is 32 minutes.   Sherrilyn Rist MD [facility] Personal pager: [phone] If unanswered, please page CCM On-call: [phone]

## 2019-09-25 NOTE — Progress Notes (Signed)
Pt recently extubated, will resume CPT at 1600 treatment due to elevated BP via a-line at this time.

## 2019-09-25 NOTE — Progress Notes (Signed)
OT Cancellation Note  Patient Details Name: Patricia Mata MRN: [id number] DOB: October 19, 1955   Cancelled Treatment:    Reason Eval/Treat Not Completed: Medical issues which prohibited therapy. Pt on vent. Will discontinue OT evaluation orders and request physician orders when patient is appropriate for skilled OT services.   Gianah Batt 09/25/2019, 7:09 AM

## 2019-09-25 NOTE — Progress Notes (Signed)
PT Cancellation Note  Patient Details Name: Patricia Mata MRN: [id number] DOB: 1956-04-29   Cancelled Treatment:     PT deferred, pt continues on ventilator.  Will follow.   Khylan Sawyer 09/25/2019, 6:38 AM

## 2019-09-25 NOTE — Procedures (Signed)
Extubation Procedure Note  Patient Details:   Name: Patricia Mata DOB: 1955/09/09 MRN: [id number]   Airway Documentation:    Vent end date: 09/25/19 Vent end time: 1115   Evaluation  O2 sats: stable throughout Complications: No apparent complications Patient did tolerate procedure well. Bilateral Breath Sounds: Diminished   Yes   Pt extubated per MD order with RN and RT at bedside. Cuff leak heard prior to extubation. Pt able to speak name and follow commands. Extubated to 4L [place], SATs decreased to 88%. [place] increased to 6L. SATs maintaining 90-92% at this time. Pt has a strong cough, bloody thick mucous suctioned via yankauer. BBS are rhonchus. RT will continue to monitor pt for any signs of distress.  Esperanza Sheets T 09/25/2019, 11:17 AM

## 2019-09-25 NOTE — Progress Notes (Signed)
Pharmacy Antibiotic Note  Patricia Mata is a 64 y.o. female admitted on 09/20/2019 with sepsis.  Pharmacy was consulted for Vancomycin, cefepime dosing.  Plan: D5 full abx Continue Cefepime at 2gm q24hr   Height: _0  (162.6 cm) Weight: 204 lb 12.9 oz (92.9 kg) IBW/kg (Calculated) : 54.7  Temp (24hrs), Avg:98.9 F (37.2 C), Min:98 F (36.7 C), Max:99.7 F (37.6 C)  Recent Labs  Lab 09/20/19 1823 09/20/19 1823 09/21/19 0056 09/21/19 0300 09/22/19 0300 09/23/19 0511 09/24/19 0610 09/25/19 0448  WBC 27.2*   < >  --  27.0* 30.2* 11.7* 11.6* 19.0*  CREATININE 2.55*   < >  --  2.26* 2.49* 2.54* 2.45* 2.57*  LATICACIDVEN 2.3*  --  1.0 1.2  --   --   --   --    < > = values in this interval not displayed.    Estimated Creatinine Clearance: 24.4 mL/min (A) (by C-G formula based on SCr of 2.57 mg/dL (H)).    Allergies  Allergen Reactions  . Ancef [Cefazolin] Itching    Severe itching- after procedure, ancef was the antibiotic.-04/02/17 Tolerates penicillins  . Sulfa Antibiotics Rash and Other (See Comments)    Blisters, also    Antimicrobials this admission: 3/8 Vancomycin >>3/11 3/8 Cefepime  >>   Dose adjustments this admission:  Microbiology results: 3/8 BCx x1: ng  3/8 SputumCx: normal flora- FINAL 3/8 Covid: neg 3/8 Resp panel: neg 3/9 MRSA PCR: neg 3/9 Strep pneumo: neg 3/9 Legionella: neg 3/10 BAL PCP: negative 3/10 BAL AFB: smear neg, cx in process 3/10 BAL Fungus: none on gram stain cx, in process 3/10 BAL Cx: no organsim on gram stain, ng-final 3/12 TrachAsp: ngtd   Thank you for allowing pharmacy to be a part of this patient's care.  Minda Ditto PharmD 09/25/2019 11:04 AM

## 2019-09-25 NOTE — Progress Notes (Signed)
Patient became aggitated when RT started CPT. Will hold at this time

## 2019-09-25 NOTE — Progress Notes (Signed)
  Patient Profile:   Patricia Mata is a 64 y.o. female with a hx of obesity, HTN, moderate but secondary pulmonary hypertension (PA pressures 93) (yet still treated with sildenafil), AS and MS both moderate, Afib with RVR 04/2018 complicated by congestive heart failure requiring CVVHD and amiodarone with recurrent anemia, IDDM, CKD, and stage 3 colon cancer (2008, s/p hemicolectomy), and myelodysplastic syndrome dx on 01/2016 with recent admission for PNA  and readmitted for respiratory failure, question hospital-acquired pneumonia and acute on chronic heart failure ARDS  Not anticoagulated secondary to bleeding issues  Elevated troponin was thought probably secondary to demand, not withstanding four-vessel coronary artery calcification.  Echocardiogram normal LV function left ear-mean gradient 42 but described by Dr. PN as valve opening        Subjective:   Extubated  Says she is better   Objective:  Vitals:   09/25/19 0900 09/25/19 1000 09/25/19 1100 09/25/19 1115  BP:      Pulse:    83  Resp: 15 20 14 (!) 28  Temp:      TempSrc:      SpO2:   98% 92%  Weight:      Height:        Intake/Output from previous day:  Intake/Output Summary (Last 24 hours) at 09/25/2019 1156 Last data filed at 09/25/2019 1000 Gross per 24 hour  Intake 1450.88 ml  Output 950 ml  Net 500.88 ml    Physical Exam: Well developed and nourished in no acute distress extubated HENT asymmetric facies  Neck supple   Crackles  Regular rate and rhythm, no murmurs or gallops Abd-soft with active BS No Clubbing cyanosis edema Skin-warm and dry A & Oriented  Grossly normal sensory and motor function  Telemetry Personally reviewed sinus     Lab Results: Basic Metabolic Panel: Recent Labs    09/24/19 0610 09/25/19 0448  NA 140 139  K 3.9 4.2  CL 110 110  CO2 20* 20*  GLUCOSE 131* 214*  BUN 68* 87*  CREATININE 2.45* 2.57*  CALCIUM 8.6* 8.7*  MG 1.8 1.8  PHOS 2.6 2.7   Liver Function  Tests: Recent Labs    09/23/19 0511 09/24/19 0610  AST 21 18  ALT 12 9  ALKPHOS 93 106  BILITOT 1.3* 1.0  PROT 6.0* 5.7*  ALBUMIN 2.2* 2.1*    CBC: Recent Labs    09/24/19 0610 09/25/19 0448  WBC 11.6* 19.0*  NEUTROABS 8.8* 14.0*  HGB 6.9* 7.3*  HCT 21.6* 22.8*  MCV 92.3 92.3  PLT 213 240   Cardiac Enzymes: High Sensitivity Troponin:   Recent Labs  Lab 09/20/19 1823 09/20/19 2302 09/21/19 0828 09/21/19 1036  TROPONINIHS 62* 215* 254* 232*      BNP    Component Value Date/Time   BNP 548.2 (H) 09/20/2019 1823    D-Dimer: No results for input(s): DDIMER in the last 72 hours. Hemoglobin A1C: No results for input(s): HGBA1C in the last 72 hours. Fasting Lipid Panel: No results for input(s): CHOL, HDL, LDLCALC, TRIG, CHOLHDL, LDLDIRECT in the last 72 hours. Thyroid Function Tests: No results for input(s): TSH, T4TOTAL, T3FREE, THYROIDAB in the last 72 hours.  Invalid input(s): FREET3 Anemia Panel: No results for input(s): VITAMINB12, FOLATE, FERRITIN, TIBC, IRON, RETICCTPCT in the last 72 hours.  Imaging: DG Chest Port 1 View  Result Date: 09/25/2019 CLINICAL DATA:  Respiratory failure EXAM: PORTABLE CHEST 1 VIEW COMPARISON:  Yesterday FINDINGS: Endotracheal tube tip is at the clavicular heads. Left   IJ line with tip at the SVC. The enteric tube at least reaches the stomach. Hazy bilateral pulmonary opacity that is airspace disease by CT. No pleural fluid with seen by CT. Borderline heart size IMPRESSION: Stable hardware positioning and extensive pneumonia. Electronically Signed   By: Jonathon  Watts M.D.   On: 09/25/2019 06:50   DG Chest Port 1 View  Result Date: 09/24/2019 CLINICAL DATA:  Respiratory failure EXAM: PORTABLE CHEST 1 VIEW COMPARISON:  Yesterday FINDINGS: ETT tip is at the clavicular heads. Left IJ line with tip partially obscured by EKG leads, tip at the SVC. The enteric tube tip at least reaches the stomach. Low volume chest with hazy opacity on  both sides, airspace disease by CT. No change from before. No visible effusion or pneumothorax. IMPRESSION: Unchanged hardware positioning and multifocal pneumonia. Electronically Signed   By: Jonathon  Watts M.D.   On: 09/24/2019 07:19    Cardiac Studies:  ECG:  SR rate 79 nonspecific ST changes    Telemetry: ? Sinus or junctional with long PR    Echo: EF 65-70% mild/moderate MR mild MS mean gradient 9 mmHg severe AS mean gradient 42 mmHg  Medications:   . sodium chloride   Intravenous Once  . allopurinol  100 mg Oral Daily  . atorvastatin  40 mg Oral q1800  . chlorhexidine gluconate (MEDLINE KIT)  15 mL Mouth Rinse BID  . Chlorhexidine Gluconate Cloth  6 each Topical Daily  . heparin injection (subcutaneous)  5,000 Units Subcutaneous Q8H  . insulin aspart  0-20 Units Subcutaneous Q4H  . insulin glargine  5 Units Subcutaneous Q2200  . latanoprost  1 drop Both Eyes QHS  . levothyroxine  88 mcg Oral Q0600  . mouth rinse  15 mL Mouth Rinse 10 times per day  . sennosides  5 mL Oral BID  . sildenafil  20 mg Oral TID  . sodium chloride flush  10-40 mL Intracatheter Q12H  . sodium chloride flush  3 mL Intravenous Q12H     . sodium chloride Stopped (09/21/19 0756)  . sodium chloride    . ceFEPime (MAXIPIME) IV Stopped (09/24/19 1812)  . feeding supplement (VITAL HIGH PROTEIN) 1,000 mL (09/25/19 0504)  . fentaNYL infusion INTRAVENOUS Stopped (09/25/19 0924)  . norepinephrine (LEVOPHED) Adult infusion Stopped (09/25/19 0916)    Assessment/Plan:   Respiratory failure--ARDS/hospital-acquired pneumonia  Pulmonary hypertension-treated with sildenafil  Aortic stenosis-severe and mitral valve disease not a candidate for TAVR  Atrial fibrillation  Amiodarone therapy on hold  Renal insufficiency grade 4  Anemia  Leukocytosis   Now Extubated  In sinus  Would resume amiodarone     

## 2019-09-26 LAB — GLUCOSE, CAPILLARY
Glucose-Capillary: 106 mg/dL — ABNORMAL HIGH (ref 70–99)
Glucose-Capillary: 115 mg/dL — ABNORMAL HIGH (ref 70–99)
Glucose-Capillary: 118 mg/dL — ABNORMAL HIGH (ref 70–99)
Glucose-Capillary: 141 mg/dL — ABNORMAL HIGH (ref 70–99)
Glucose-Capillary: 98 mg/dL (ref 70–99)

## 2019-09-26 LAB — CBC WITH DIFFERENTIAL/PLATELET
Abs Immature Granulocytes: 1.46 10*3/uL — ABNORMAL HIGH (ref 0.00–0.07)
Basophils Absolute: 0.2 10*3/uL — ABNORMAL HIGH (ref 0.0–0.1)
Basophils Relative: 1 %
Eosinophils Absolute: 0.2 10*3/uL (ref 0.0–0.5)
Eosinophils Relative: 1 %
HCT: 25 % — ABNORMAL LOW (ref 36.0–46.0)
Hemoglobin: 7.9 g/dL — ABNORMAL LOW (ref 12.0–15.0)
Immature Granulocytes: 7 %
Lymphocytes Relative: 7 %
Lymphs Abs: 1.4 10*3/uL (ref 0.7–4.0)
MCH: 29.2 pg (ref 26.0–34.0)
MCHC: 31.6 g/dL (ref 30.0–36.0)
MCV: 92.3 fL (ref 80.0–100.0)
Monocytes Absolute: 1.8 10*3/uL — ABNORMAL HIGH (ref 0.1–1.0)
Monocytes Relative: 9 %
Neutro Abs: 15.7 10*3/uL — ABNORMAL HIGH (ref 1.7–7.7)
Neutrophils Relative %: 75 %
Platelets: 263 10*3/uL (ref 150–400)
RBC: 2.71 MIL/uL — ABNORMAL LOW (ref 3.87–5.11)
RDW: 20.8 % — ABNORMAL HIGH (ref 11.5–15.5)
WBC: 20.6 10*3/uL — ABNORMAL HIGH (ref 4.0–10.5)
nRBC: 1.5 % — ABNORMAL HIGH (ref 0.0–0.2)

## 2019-09-26 LAB — BASIC METABOLIC PANEL
Anion gap: 12 (ref 5–15)
BUN: 74 mg/dL — ABNORMAL HIGH (ref 8–23)
CO2: 22 mmol/L (ref 22–32)
Calcium: 9.1 mg/dL (ref 8.9–10.3)
Chloride: 111 mmol/L (ref 98–111)
Creatinine, Ser: 2.37 mg/dL — ABNORMAL HIGH (ref 0.44–1.00)
GFR calc Af Amer: 24 mL/min — ABNORMAL LOW (ref >60)
GFR calc non Af Amer: 21 mL/min — ABNORMAL LOW (ref >60)
Glucose, Bld: 97 mg/dL (ref 70–99)
Potassium: 4.2 mmol/L (ref 3.5–5.1)
Sodium: 145 mmol/L (ref 135–145)

## 2019-09-26 MED ORDER — HYDROCERIN EX CREA
TOPICAL_CREAM | Freq: Two times a day (BID) | CUTANEOUS | Status: DC
  Administered 2019-09-26 – 2019-09-29 (×2): 1 via TOPICAL
  Filled 2019-09-26: qty 113

## 2019-09-26 MED ORDER — AMIODARONE HCL 200 MG PO TABS
200.0000 mg | ORAL_TABLET | Freq: Every day | ORAL | Status: DC
  Administered 2019-09-27 – 2019-10-03 (×7): 200 mg via ORAL
  Filled 2019-09-26 (×7): qty 1

## 2019-09-26 MED ORDER — METOPROLOL TARTRATE 5 MG/5ML IV SOLN
2.5000 mg | Freq: Four times a day (QID) | INTRAVENOUS | Status: DC | PRN
  Administered 2019-09-26 (×2): 2.5 mg via INTRAVENOUS
  Filled 2019-09-26 (×2): qty 5

## 2019-09-26 MED ORDER — FUROSEMIDE 10 MG/ML IJ SOLN
40.0000 mg | Freq: Once | INTRAMUSCULAR | Status: AC
  Administered 2019-09-26: 40 mg via INTRAVENOUS
  Filled 2019-09-26: qty 4

## 2019-09-26 MED ORDER — AMLODIPINE BESYLATE 5 MG PO TABS: 5.0000 mg | ORAL_TABLET | Freq: Every day | ORAL | Status: DC

## 2019-09-26 NOTE — Progress Notes (Signed)
Patient Profile:   Patricia Mata is a 64 y.o. female with a hx of obesity, HTN, moderate but secondary pulmonary hypertension (PA pressures 93) (yet still treated with sildenafil), AS and MS both moderate, Afib with RVR 60/7371 complicated by congestive heart failure requiring CVVHD and amiodarone with recurrent anemia, IDDM, CKD, and stage 3 colon cancer (2008, s/p hemicolectomy), and myelodysplastic syndrome dx on 01/2016 with recent admission for PNA  and readmitted for respiratory failure, question hospital-acquired pneumonia and acute on chronic heart failure ARDS  Not anticoagulated secondary to bleeding issues  Elevated troponin was thought probably secondary to demand, not withstanding four-vessel coronary artery calcification.  Echocardiogram normal LV function AS-mean gradient 42 but described by Dr. Mariana Single as valve opening visually    Subjective:   Extubated  Now back on bipap  Denies sob and pain   Objective:  Vitals:   09/26/19 0400 09/26/19 0436 09/26/19 0832 09/26/19 0845  BP: (!) 142/39   (!) 173/53  Pulse:    97  Resp: (!) 21   20  Temp:   98.1 F (36.7 C)   TempSrc:   Axillary   SpO2: 100%   94%  Weight:  87.2 kg    Height:        Intake/Output from previous day:  Intake/Output Summary (Last 24 hours) at 09/26/2019 1009 Last data filed at 09/26/2019 0724 Gross per 24 hour  Intake 105.5 ml  Output 4350 ml  Net -4244.5 ml    Physical Exam: Well developed and nourished in now on bipap==no respiratory distress  HENT normal Neck supple with JVP-not assessed  Clear lat Regular rate and rhythm, 3*6 m single s2  Abd-soft with active BS No Clubbing cyanosis edema Skin-warm and dry A & Oriented  Grossly normal sensory and motor function     Telemetry Personally reviewed sinus     Lab Results: Basic Metabolic Panel: Recent Labs    09/24/19 0610 09/24/19 0610 09/25/19 0448 09/26/19 0343  NA 140   < > 139 145  K 3.9   < > 4.2 4.2  CL 110   < > 110  111  CO2 20*   < > 20* 22  GLUCOSE 131*   < > 214* 97  BUN 68*   < > 87* 74*  CREATININE 2.45*   < > 2.57* 2.37*  CALCIUM 8.6*   < > 8.7* 9.1  MG 1.8  --  1.8  --   PHOS 2.6  --  2.7  --    < > = values in this interval not displayed.   Liver Function Tests: Recent Labs    09/24/19 0610  AST 18  ALT 9  ALKPHOS 106  BILITOT 1.0  PROT 5.7*  ALBUMIN 2.1*    CBC: Recent Labs    09/25/19 0448 09/26/19 0343  WBC 19.0* 20.6*  NEUTROABS 14.0* 15.7*  HGB 7.3* 7.9*  HCT 22.8* 25.0*  MCV 92.3 92.3  PLT 240 263   Cardiac Enzymes: High Sensitivity Troponin:   Recent Labs  Lab 09/20/19 1823 09/20/19 2302 09/21/19 0828 09/21/19 1036  TROPONINIHS 62* 215* 254* 232*      BNP    Component Value Date/Time   BNP 548.2 (H) 09/20/2019 1823    D-Dimer: No results for input(s): DDIMER in the last 72 hours. Hemoglobin A1C: No results for input(s): HGBA1C in the last 72 hours. Fasting Lipid Panel: No results for input(s): CHOL, HDL, LDLCALC, TRIG, CHOLHDL, LDLDIRECT in the last 72  hours. Thyroid Function Tests: No results for input(s): TSH, T4TOTAL, T3FREE, THYROIDAB in the last 72 hours.  Invalid input(s): FREET3 Anemia Panel: No results for input(s): VITAMINB12, FOLATE, FERRITIN, TIBC, IRON, RETICCTPCT in the last 72 hours.  Imaging: DG Chest Port 1 View  Result Date: 09/25/2019 CLINICAL DATA:  Respiratory failure EXAM: PORTABLE CHEST 1 VIEW COMPARISON:  Yesterday FINDINGS: Endotracheal tube tip is at the clavicular heads. Left IJ line with tip at the SVC. The enteric tube at least reaches the stomach. Hazy bilateral pulmonary opacity that is airspace disease by CT. No pleural fluid with seen by CT. Borderline heart size IMPRESSION: Stable hardware positioning and extensive pneumonia. Electronically Signed   By: Monte Fantasia M.D.   On: 09/25/2019 06:50    Cardiac Studies:  ECG:  SR rate 79 nonspecific ST changes    Telemetry: ? Sinus or junctional with long PR     Echo: EF 65-70% mild/moderate MR mild MS mean gradient 9 mmHg severe AS mean gradient 42 mmHg  Medications:   . sodium chloride   Intravenous Once  . allopurinol  100 mg Oral Daily  . atorvastatin  40 mg Oral q1800  . chlorhexidine gluconate (MEDLINE KIT)  15 mL Mouth Rinse BID  . Chlorhexidine Gluconate Cloth  6 each Topical Daily  . heparin injection (subcutaneous)  5,000 Units Subcutaneous Q8H  . insulin aspart  0-20 Units Subcutaneous Q4H  . insulin glargine  5 Units Subcutaneous Q2200  . ipratropium-albuterol  3 mL Nebulization QID  . latanoprost  1 drop Both Eyes QHS  . levothyroxine  88 mcg Oral Q0600  . mouth rinse  15 mL Mouth Rinse 10 times per day  . sennosides  5 mL Oral BID  . sildenafil  20 mg Oral TID  . sodium chloride flush  10-40 mL Intracatheter Q12H  . sodium chloride flush  3 mL Intravenous Q12H     . sodium chloride Stopped (09/21/19 0756)  . sodium chloride    . ceFEPime (MAXIPIME) IV Stopped (09/25/19 1827)  . feeding supplement (VITAL HIGH PROTEIN) Stopped (09/25/19 1030)  . fentaNYL infusion INTRAVENOUS Stopped (09/25/19 0924)  . norepinephrine (LEVOPHED) Adult infusion Stopped (09/25/19 1057)    Assessment/Plan:   Respiratory failure--ARDS/hospital-acquired pneumonia  Pulmonary hypertension-treated with sildenafil  Aortic stenosis-severe and mitral valve disease not a candidate for TAVR  Atrial fibrillation  Hypertension   Amiodarone therapy on hold  Renal insufficiency grade 4  Anemia  Asymmetric facies   Leukocytosis  Pt currently NPO  Hypertension--poorly controlled would resume amlodipine at 5 mg; can give BB IV as needed   With AS would avoid vasodilators   Per husband, asymmetric facies are old  Resume amiodarone when po resumes

## 2019-09-26 NOTE — Progress Notes (Signed)
NAME:  Patricia Mata, MRN:  [id number], DOB:  04-Feb-1956, LOS: 6 ADMISSION DATE:  09/20/2019, CONSULTATION DATE:  09/20/19 REFERRING MD:  Roel Cluck, CHIEF COMPLAINT:  Pneumonia  Brief History   This is a 64 yo with history of IDDM, HTN, CKD, Pulm HTN, gout, colon cancer who presents for recurrence of pna like symptoms after treatment for CAP for 5 days on previous admisison from 3/02-3/05.   History of present illness   This is a 64 yo with history as noted above with history of IDDM, HTN, CKD, Pulm HTN, gout, colon cancer who presents for recurrence of pna like symptoms after treatment for CAP for 5 days on previous admisison from 3/02-3/05. Today presenting with recurrence of pna like symptoms. Increased cough that is green in color. Patient with increased WOB as well. Felt it was getting harder to breath. No increase in weight. No lower extremity edema. Has been taking lasix and sildenafil daily. No fevers but is feeling worse. Smoked for about 15 pack years 20 years ago. No travels recently. Has not lived in [place] country for more than 6 months. Has lived here in [place] whole life. 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 No tb risk factor including homelessness, IVDU or incarceration. Has a dog that lives with her. No birds.   Past Medical History  CAP Anemia in CKD Essential HNT HLD T2DM Hypoxia  Significant Hospital Events   Patient being admitted to stepdown Extubated 3/13 2021  Consults:  PCCM  Procedures:  09/22/2018 intubation>> 3/13 extubation   Significant Diagnostic Tests:  CT chest 3/8 > ARDS, air bronchograms c/w PNA, trace effusions, cardiomegaly, left adrenal nodule, hepatomegaly. Echocardiogram   Micro Data:  Sputum 3/8 > not usable RVP 3/8 > neg. COVID 3/8 > neg. 09/22/2019 tracheal aspirate from BAL>>  Antimicrobials:  Vancomycin 3/8 Cefepime 3/8>> today's day 7 Ceftriaxone 3/4   Interim history/subjective:  Awake and  interactive Required BiPAP through the night Thick mucoid secretions, some difficulty clearing secretions  Objective   Blood pressure (!) 142/39, pulse 74, temperature 99 F (37.2 C), temperature source Axillary, resp. rate (!) 21, height _0  (1.626 m), weight 87.2 kg, SpO2 100 %.    Vent Mode: BIPAP FiO2 (%):  [50 %-60 %] 50 % Set Rate:  [15 bmp] 15 bmp PEEP:  [5 cmH20] 5 cmH20 Pressure Support:  [10 cmH20] 10 cmH20   Intake/Output Summary (Last 24 hours) at 09/26/2019 0831 Last data filed at 09/26/2019 0724 Gross per 24 hour  Intake 892.33 ml  Output 4700 ml  Net -3807.67 ml   Filed Weights   09/24/19 0500 09/25/19 0427 09/26/19 0436  Weight: 90.2 kg 92.9 kg 87.2 kg    Examination: General: Obese, comfortable HEENT: Dry oral mucosa neuro: Arousable, dysphonic voice CV: S1-S2 appreciated PULM: Rhonchi bilaterally Bowel sounds appreciated   Chest x-ray reviewed 09/25/2019-multifocal infiltrate GI: soft, bsx4 active    Assessment & Plan: Community-acquired pneumonia Healthcare associated pneumonia-was recently treated with antibiotics in the hospital ARDS Treated for Covid pneumonia 03/18/2019 Normal PFTs 03/14/2019-DLCO was decreased on the study -Bronchoscopy BAL not showing any organisms -Pulmonary toileting -Bronchodilators  Hypoxemic respiratory failure -BiPAP as needed -Oxygen supplementation to keep saturations greater than 89%  Pulmonary hypertension -Most recent right heart cath on 04/23/2019 noted PA pressure of 57/12(32), wedge was 14 -This is improved from a cardiac cath in 2019 showing mean pressure of 62 (105/40) with a wedge of 25 -We will continue monitoring -Cautious  diuresis -was on sildenafil  Diabetes -SSI   Anemia in the setting of myeloproliferative disorder -H&H stable  Left adrenal nodule -Outpatient follow-up  Discontinue cefepime after dose today BiPAP as needed Oxygen supplementation to keep saturations greater than  89% Pulmonary toileting -Add incentive spirometer -Flutter device use  Risk of decompensation still remains high   Best practice:  Diet: Npo for now Pain/Anxiety/Delirium protocol (if indicated):  VAP protocol (if indicated): Not indicated DVT prophylaxis: Heparin GI prophylaxis: PPI Mobility: Rest Code Status: Code Family Communication: 09/25/2019 husband Mykenzie Ebanks was updated at bedside  The patient is critically ill with multiple organ systems failure and requires high complexity decision making for assessment and support, frequent evaluation and titration of therapies, application of advanced monitoring technologies and extensive interpretation of multiple databases. Critical Care Time devoted to patient care services described in this note independent of APP/resident time (if applicable)  is 35 minutes.   Sherrilyn Rist MD [facility] Personal pager: [phone] If unanswered, please page CCM On-call: [phone]

## 2019-09-26 NOTE — Evaluation (Signed)
Clinical/Bedside Swallow Evaluation Patient Details  Name: Patricia Mata MRN: [id number] Date of Birth: 01-Jul-1956  Today's Date: 09/26/2019 Time: SLP Start Time (ACUTE ONLY): 0908 SLP Stop Time (ACUTE ONLY): 0921 SLP Time Calculation (min) (ACUTE ONLY): 13 min  Past Medical History:  Past Medical History:  Diagnosis Date  . Acute kidney injury (nontraumatic) ([place])   . Acute renal failure (ARF) ([place]) 06/08/2018  . Cancer [facility]) 2008   Colon   . Diabetes mellitus without complication ([place])   . Gout 09/08/2018  . Hypertension   . Macrocytic anemia    Past Surgical History:  Past Surgical History:  Procedure Laterality Date  . COLON SURGERY    . DEBRIDMENT OF DECUBITUS ULCER N/A 06/12/2018   Procedure: DEBRIDMENT OF DECUBITUS ULCER;  Surgeon: Wallace Going, DO;  Location: [facility];  Service: Plastics;  Laterality: N/A;  . IR FLUORO GUIDE PORT INSERTION RIGHT  04/02/2017  . IR REMOVAL TUN ACCESS W/ PORT W/O FL MOD SED  03/16/2019  . IR US GUIDE VASC ACCESS RIGHT  04/02/2017  . RIGHT HEART CATH N/A 04/23/2019   Procedure: RIGHT HEART CATH;  Surgeon: Jolaine Artist, MD;  Location: [facility];  Service: Cardiovascular;  Laterality: N/A;  . RIGHT/LEFT HEART CATH AND CORONARY ANGIOGRAPHY N/A 05/21/2018   Procedure: RIGHT/LEFT HEART CATH AND CORONARY ANGIOGRAPHY;  Surgeon: Nelva Bush, MD;  Location: [facility];  Service: Cardiovascular;  Laterality: N/A;   HPI:  This is a 64 yo with history of IDDM, HTN, CKD, Pulm HTN, gout, colon cancer who presents for recurrence of pna like symptoms after treatment for CAP for 5 days on previous admission from 3/02-3/05.  Pt was seen by ST on11/11/2017 for a BSE with recommendations for regular solids and thin liquids.  Pt was intubated from 09/21/19- 09/25/19.      Assessment / Plan / Recommendation Clinical Impression  Pt was seen for a bedside swallow evaluation in the setting of her recent extubation.  She was encountered  awake/alert and she appeared to be uncomfortable with what appeared to be increased work of breathing.  MD reported that pt had decreased secretion management and that was intermittently requiring BiPAP.  She was on high flow nasal cannula during this evaluation.  Pt exhibited significant dysphonia with hoarse and breathy vocal quality also observed.  Oral mech exam was limited secondary to pt being unable to follow some commands, but generalized oral weakness was observed in addition to a white/yellow coating on the pt's lingual surface concerning for thrush.  RN reported that she had recently completed oral care with the pt and was aware of the coating on her tongue.  Pt consumed minimal trials of ice chips, 1/2 tsp of water, and 1/2 tsp of puree.  She exhibited good bolus acceptance with all trials.  Suspect prolonged AP transport and delayed swallow initiation.  RR increased slightly and O2 saturation decreased slightly during all PO trials.  Pt was also observed to have watery eyes during PO trials and a delayed cough was observed following thin liquid trials.  Cough was strong and pt was able to mobilize phlegm to her oral cavity and it was subsequently suctioned.  Due to clinical presentation and suspected decreased airway protection,  recommend continuation of NPO at this time with frequent oral care and medications administered via alternative means.  SLP will follow closely to determine readiness for clinical diet initiation vs instrumental swallow study.    SLP Visit Diagnosis: Dysphagia, unspecified (  R13.10)    Aspiration Risk  Moderate aspiration risk    Diet Recommendation NPO   Medication Administration: Via alternative means    Other  Recommendations Oral Care Recommendations: Oral care QID;Staff/trained caregiver to provide oral care Other Recommendations: Remove water pitcher;Have oral suction available   Follow up Recommendations Other (comment)(TBD)      Frequency and Duration  min 2x/week  2 weeks       Prognosis Prognosis for Safe Diet Advancement: Good      Swallow Study   General Date of Onset: 09/26/19 HPI: This is a 64 yo with history of IDDM, HTN, CKD, Pulm HTN, gout, colon cancer who presents for recurrence of pna like symptoms after treatment for CAP for 5 days on previous admisison from 3/02-3/05.  Pt was seen by ST on11/11/2017 for a BSE with recommenations for regular solids and thin liquids.  Pt was intubated from 09/21/19- 09/25/19.    Type of Study: Bedside Swallow Evaluation Previous Swallow Assessment: See HPI  Diet Prior to this Study: NPO Temperature Spikes Noted: Yes Respiratory Status: Nasal cannula History of Recent Intubation: Yes Length of Intubations (days): 3 days Date extubated: 09/25/19 Behavior/Cognition: Alert;Requires cueing Oral Cavity Assessment: Dry Oral Care Completed by SLP: Recent completion by staff Self-Feeding Abilities: Needs assist Patient Positioning: Upright in bed Baseline Vocal Quality: Low vocal intensity;Breathy;Hoarse Volitional Cough: Congested;Strong Volitional Swallow: Able to elicit    Oral/Motor/Sensory Function Overall Oral Motor/Sensory Function: Generalized oral weakness(Pt unable to follow some commands ) Facial Symmetry: Within Functional Limits Lingual ROM: Within Functional Limits Lingual Symmetry: Within Functional Limits Lingual Strength: Reduced   Ice Chips Ice chips: Impaired Presentation: Spoon Pharyngeal Phase Impairments: Suspected delayed Swallow   Thin Liquid Thin Liquid: Impaired Presentation: Spoon Pharyngeal  Phase Impairments: Suspected delayed Swallow;Cough - Delayed    Nectar Thick Nectar Thick Liquid: Not tested   Honey Thick Honey Thick Liquid: Not tested   Puree Puree: Impaired Presentation: Spoon Pharyngeal Phase Impairments: Suspected delayed Swallow   Solid     Solid: Not tested     Colin Mulders M.S., CCC-SLP Acute Rehabilitation Services Office: [phone]  Patricia Mata 09/26/2019,9:37 AM

## 2019-09-26 NOTE — Progress Notes (Signed)
eLink Physician-Brief Progress Note Patient Name: Patricia Mata DOB: July 25, 1955 MRN: [id number]   Date of Service  09/26/2019  HPI/Events of Note  Patient has foley catheter already in place for UOP monitoring / strict IO balance given diuretics for PH. RN requests order for this.   eICU Interventions  Continue foley order placed.     Intervention Category Minor Interventions: Other:  [name] 09/26/2019, 10:54 PM

## 2019-09-26 NOTE — Progress Notes (Signed)
eLink Physician-Brief Progress Note Patient Name: Patricia Mata DOB: May 20, 1956 MRN: [id number]   Date of Service  09/26/2019  HPI/Events of Note  Asking for AM labs. Hg 7.4  eICU Interventions  BMP and CBC for AM ordered     Intervention Category Intermediate Interventions: Other:  Elmer Sow 09/26/2019, 1:22 AM

## 2019-09-26 NOTE — Progress Notes (Signed)
PT Cancellation Note  Patient Details Name: Patricia Mata MRN: [id number] DOB: May 06, 1956   Cancelled Treatment:     PT deferred this date on advice of RN.  Pt has been extubated with SOB with min activity.  Will follow.   Anahita Cua 09/26/2019, 3:31 PM

## 2019-09-27 LAB — CBC
HCT: 24.8 % — ABNORMAL LOW (ref 36.0–46.0)
Hemoglobin: 8 g/dL — ABNORMAL LOW (ref 12.0–15.0)
MCH: 30.4 pg (ref 26.0–34.0)
MCHC: 32.3 g/dL (ref 30.0–36.0)
MCV: 94.3 fL (ref 80.0–100.0)
Platelets: 256 10*3/uL (ref 150–400)
RBC: 2.63 MIL/uL — ABNORMAL LOW (ref 3.87–5.11)
RDW: 21.1 % — ABNORMAL HIGH (ref 11.5–15.5)
WBC: 13.2 10*3/uL — ABNORMAL HIGH (ref 4.0–10.5)
nRBC: 0.6 % — ABNORMAL HIGH (ref 0.0–0.2)

## 2019-09-27 LAB — BASIC METABOLIC PANEL
Anion gap: 10 (ref 5–15)
BUN: 68 mg/dL — ABNORMAL HIGH (ref 8–23)
CO2: 25 mmol/L (ref 22–32)
Calcium: 9.2 mg/dL (ref 8.9–10.3)
Chloride: 110 mmol/L (ref 98–111)
Creatinine, Ser: 2.15 mg/dL — ABNORMAL HIGH (ref 0.44–1.00)
GFR calc Af Amer: 27 mL/min — ABNORMAL LOW (ref >60)
GFR calc non Af Amer: 24 mL/min — ABNORMAL LOW (ref >60)
Glucose, Bld: 105 mg/dL — ABNORMAL HIGH (ref 70–99)
Potassium: 4.4 mmol/L (ref 3.5–5.1)
Sodium: 145 mmol/L (ref 135–145)

## 2019-09-27 LAB — GLUCOSE, CAPILLARY
Glucose-Capillary: 100 mg/dL — ABNORMAL HIGH (ref 70–99)
Glucose-Capillary: 105 mg/dL — ABNORMAL HIGH (ref 70–99)
Glucose-Capillary: 122 mg/dL — ABNORMAL HIGH (ref 70–99)
Glucose-Capillary: 232 mg/dL — ABNORMAL HIGH (ref 70–99)
Glucose-Capillary: 239 mg/dL — ABNORMAL HIGH (ref 70–99)
Glucose-Capillary: 55 mg/dL — ABNORMAL LOW (ref 70–99)
Glucose-Capillary: 86 mg/dL (ref 70–99)

## 2019-09-27 LAB — CULTURE, RESPIRATORY W GRAM STAIN

## 2019-09-27 MED ORDER — AMLODIPINE BESYLATE 5 MG PO TABS
5.0000 mg | ORAL_TABLET | Freq: Every day | ORAL | Status: DC
  Administered 2019-09-27: 5 mg via ORAL
  Filled 2019-09-27: qty 1

## 2019-09-27 MED ORDER — METOPROLOL TARTRATE 5 MG/5ML IV SOLN
2.5000 mg | Freq: Four times a day (QID) | INTRAVENOUS | Status: DC | PRN
  Administered 2019-09-27: 2.5 mg via INTRAVENOUS
  Filled 2019-09-27 (×2): qty 5

## 2019-09-27 MED ORDER — AMLODIPINE BESYLATE 10 MG PO TABS: 10.0000 mg | ORAL_TABLET | Freq: Every day | ORAL | Status: DC

## 2019-09-27 MED ORDER — ORAL CARE MOUTH RINSE
15.0000 mL | Freq: Two times a day (BID) | OROMUCOSAL | Status: DC
  Administered 2019-09-27 – 2019-10-03 (×12): 15 mL via OROMUCOSAL

## 2019-09-27 NOTE — Evaluation (Signed)
Physical Therapy Evaluation Patient Details Name: Patricia Mata MRN: [id number] DOB: 08-02-1955 Today's Date: 09/27/2019   History of Present Illness  This is a 64 yo with history of IDDM, HTN, CKD, Pulm HTN, gout, colon cancer who presents for recurrence of pna like symptoms after treatment for CAP for 5 days on previous admisison from 3/02-3/05.Marland Kitchen required intubation, extubated 3/13/.  Clinical Impression  The patient presents with decreased processing of instructions, delay in carrying out activity. The patient required mod assist to stand and pivot to recliner, multimodal cues. Hopefully patient will progress to Dc home. Pt admitted with above diagnosis. Pt currently with functional limitations due to the deficits listed below (see PT Problem List). Pt will benefit from skilled PT to increase their independence and safety with mobility to allow discharge to the venue listed below.    patient on 4 L [place] with SPO2 >92%, HR  At times  Up to 115.      Follow Up Recommendations Home health PT;Supervision/Assistance - 24 hour    Equipment Recommendations  None recommended by PT    Recommendations for Other Services       Precautions / Restrictions Precautions Precautions: Fall Precaution Comments: watch sats      Mobility  Bed Mobility Overal bed mobility: Needs Assistance Bed Mobility: Supine to Sit     Supine to sit: Min assist     General bed mobility comments: extra time and repeated instructions to sit up, patient able to move legs to bed egde  Transfers Overall transfer level: Needs assistance   Transfers: Sit to/from Stand;Stand Pivot Transfers Sit to Stand: Mod assist Stand pivot transfers: Mod assist       General transfer comment: multimdal cues for safety, extra time to stnad, support for safe tansfer  Ambulation/Gait                Stairs            Wheelchair Mobility    Modified Rankin (Stroke Patients Only)       Balance Overall  balance assessment: Needs assistance Sitting-balance support: Bilateral upper extremity supported;Feet supported Sitting balance-Leahy Scale: Fair     Standing balance support: Bilateral upper extremity supported;During functional activity Standing balance-Leahy Scale: Poor                               Pertinent Vitals/Pain Pain Assessment: No/denies pain    Home Living Family/patient expects to be discharged to:: Private residence Living Arrangements: Spouse/significant other Available Help at Discharge: Family Type of Home: House Home Access: Stairs to enter Entrance Stairs-Rails: Can reach both;Left;Right Entrance Stairs-Number of Steps: 4 Home Layout: One level Home Equipment: Grab bars - tub/shower      Prior Function Level of Independence: Independent         Comments: prior to hospital     Hand Dominance   Dominant Hand: Right    Extremity/Trunk Assessment   Upper Extremity Assessment Upper Extremity Assessment: Defer to OT evaluation    Lower Extremity Assessment Lower Extremity Assessment: Generalized weakness    Cervical / Trunk Assessment Cervical / Trunk Assessment: Normal  Communication   Communication: Receptive difficulties;Expressive difficulties  Cognition Arousal/Alertness: Awake/alert   Overall Cognitive Status: Impaired/Different from baseline Area of Impairment: Orientation;Attention;Safety/judgement;Following commands;Awareness;Memory;Problem solving                 Orientation Level: Time;Situation Current Attention Level: Sustained   Following Commands: Follows  one step commands inconsistently Safety/Judgement: Decreased awareness of deficits Awareness: Emergent Problem Solving: Slow processing;Requires verbal cues;Requires tactile cues;Decreased initiation;Difficulty sequencing General Comments: required frequent cues to stay on task,  perseverates with activity-scratching, washing periaresa,.       General Comments      Exercises     Assessment/Plan    PT Assessment Patient needs continued PT services  PT Problem List Decreased strength;Decreased cognition;Decreased knowledge of precautions;Decreased mobility;Decreased knowledge of use of DME;Cardiopulmonary status limiting activity;Decreased activity tolerance;Decreased safety awareness       PT Treatment Interventions DME instruction;Functional mobility training;Patient/family education;Gait training;Therapeutic activities;Therapeutic exercise;Stair training;Cognitive remediation    PT Goals (Current goals can be found in the Care Plan section)  Acute Rehab PT Goals Patient Stated Goal: to get OOB PT Goal Formulation: With patient Time For Goal Achievement: 10/11/19 Potential to Achieve Goals: Fair    Frequency Min 3X/week   Barriers to discharge        Co-evaluation               AM-PAC PT "6 Clicks" Mobility  Outcome Measure Help needed turning from your back to your side while in a flat bed without using bedrails?: A Little Help needed moving from lying on your back to sitting on the side of a flat bed without using bedrails?: A Little Help needed moving to and from a bed to a chair (including a wheelchair)?: A Lot Help needed standing up from a chair using your arms (e.g., wheelchair or bedside chair)?: A Lot Help needed to walk in hospital room?: Total Help needed climbing 3-5 steps with a railing? : Total 6 Click Score: 12    End of Session Equipment Utilized During Treatment: Oxygen Activity Tolerance: Patient limited by fatigue Patient left: in chair;with call bell/phone within reach;with chair alarm set Nurse Communication: Mobility status PT Visit Diagnosis: Unsteadiness on feet (R26.81);Muscle weakness (generalized) (M62.81);Difficulty in walking, not elsewhere classified (R26.2)    Time: 1400-1445 PT Time Calculation (min) (ACUTE ONLY): 45 min   Charges:   PT Evaluation $PT Eval Moderate  Complexity: 1 Mod PT Treatments $Therapeutic Activity: 23-37 mins        Tresa Endo PT Acute Rehabilitation Services Pager [phone] Office [phone]   Claretha Cooper 09/27/2019, 3:29 PM

## 2019-09-27 NOTE — Progress Notes (Addendum)
NAME:  Patricia Mata, MRN:  [id number], DOB:  24-May-1956, LOS: 7 ADMISSION DATE:  09/20/2019, CONSULTATION DATE:  09/20/19 REFERRING MD:  Roel Cluck, CHIEF COMPLAINT:  Pneumonia  Brief History   This is a 64 yo with history of IDDM, HTN, CKD, Pulm HTN, gout, colon cancer who presents for recurrence of pna like symptoms after treatment for CAP for 5 days on previous admisison from 3/02-3/05.   History of present illness   This is a 64 yo with history as noted above with history of IDDM, HTN, CKD, Pulm HTN, gout, colon cancer who presents for recurrence of pna like symptoms after treatment for CAP for 5 days on previous admisison from 3/02-3/05. Today presenting with recurrence of pna like symptoms. Increased cough that is green in color. Patient with increased WOB as well. Felt it was getting harder to breath. No increase in weight. No lower extremity edema. Has been taking lasix and sildenafil daily. No fevers but is feeling worse. Smoked for about 15 pack years 20 years ago. No travels recently. Has not lived in [place] country for more than 6 months. Has lived here in [place] whole life. 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 No tb risk factor including homelessness, IVDU or incarceration. Has a dog that lives with her. No birds.   Past Medical History  CAP Anemia in CKD Essential HNT HLD T2DM Hypoxia  Significant Hospital Events   Patient being admitted to stepdown Extubated 3/13 2021  Consults:  PCCM  Procedures:  09/22/2018 intubation>> 3/13 extubation    Significant Diagnostic Tests:  CT chest 3/8 > ARDS, air bronchograms c/w PNA, trace effusions, cardiomegaly, left adrenal nodule, hepatomegaly. Echocardiogram 3/9 > LVEF 65-70% Grade 2 DD. Severe AS. Bubble study negative .   Micro Data:  Sputum 3/8 > negative RVP 3/8 > neg. COVID 3/8 > neg. Pneumocystis (BAL) 3/10 > negative 09/22/2019 tracheal aspirate from BAL> negative Fungus culture (BAL) 3/10 >>> Acide  fast smear (BAL) 3/10 > neg 3/8 blood neg  Antimicrobials:  Vancomycin 3/8 Cefepime 3/8>3/14 Ceftriaxone 3/4   Interim history/subjective:  Awake and interactive Supplemental O2 weaned down to 4L this AM and tolerated well.  Phonation improving this morning.   Objective   Blood pressure (!) 186/44, pulse 84, temperature 98.2 F (36.8 C), temperature source Oral, resp. rate 12, height _0  (1.626 m), weight 82.8 kg, SpO2 100 %.    Vent Mode: BIPAP FiO2 (%):  [40 %-60 %] 40 % Set Rate:  [12 bmp-15 bmp] 12 bmp PEEP:  [5 cmH20] 5 cmH20 Pressure Support:  [10 cmH20] 10 cmH20   Intake/Output Summary (Last 24 hours) at 09/27/2019 0759 Last data filed at 09/27/2019 0600 Gross per 24 hour  Intake 70 ml  Output 3400 ml  Net -3330 ml   Filed Weights   09/25/19 0427 09/26/19 0436 09/27/19 0500  Weight: 92.9 kg 87.2 kg 82.8 kg    Examination: General:  Middle aged female in NAD Neuro:  Alert, oriented, non-focal HEENT:  [place]/AT, No JVD noted, PERRL Cardiovascular:  RRR, 4/6 SEM Lungs:  Rhonchi Abdomen:  Soft, non-distended, non-tender.  Musculoskeletal:  No acute deformity. No edema.  Skin:  Intact, MM dry  Assessment & Plan:  Healthcare associated pneumonia-was recently treated with antibiotics in the hospital ARDS Treated for Covid pneumonia 03/18/2019 Normal PFTs 03/14/2019-DLCO was decreased on the study - Is now s/p 7 day course of cefepime - Continue to trend WBC and fever curve  Hypoxemic  respiratory failure -BiPAP PRN -Supplemental oxygen as needed to keep SpO2 > 90%, current 4L HFNC. - Incentive spirometer and flutter valve.   Pulmonary hypertension: Most recent right heart cath on 04/23/2019 noted PA pressure of 57/12(32), wedge was 14. This is improved from a cardiac cath in 2019 showing mean pressure of 62 (105/40) with a wedge of 25 -Hold diuresis today, her mucous membranes are dry and cracked.  -continue home sildenafil on sildenafil.  Hypertension -  continue amlodipine> if cannot obtain enteral access will need a new strategy  - PRN metoprolol  Nutrition - Failed SLP yesterday recommending NPO - If this does not change by today, we will need to place gastric tube.   Diabetes - SSI - lantus   Anemia in the setting of myeloproliferative disorder - H&H stable  Left adrenal nodule - Outpatient follow-up    Best practice:  Diet: Npo for now Pain/Anxiety/Delirium protocol (if indicated):  VAP protocol (if indicated): Not indicated DVT prophylaxis: Heparin GI prophylaxis: PPI Mobility: Rest Code Status: Full Code Family Communication: Will wait for husband to arrive.   Georgann Housekeeper, AGACNP-BC [place] for personal pager PCCM on call pager [phone]  09/27/2019 8:14 AM    PCCM:  64 yo admitted for pneumonia, h/o fo covid in sept 2020, known pulmonary htn, respiratory failure requiring o2 supplementation.   Passed he SLP study. Weaned to 4L today   BP (!) 186/44   Pulse 84   Temp 97.8 F (36.6 C) (Oral)   Resp 15   Ht _0  (1.626 m)   Wt 82.8 kg   SpO2 99%   BMI 31.33 kg/m   Gen: comfortable, resting in bed Heart: RRR s1 s2, systolic murmur  Lungs: diminshed BL bases Ext: no significant edema   Labs reviewed  Bronched  - neg cultures  BAL neutrophils   A:  Acute hypoxic respiratory failure  Pulmonary hypertension, on sildinefil  Severe AS H/o covid   P: Continue weaning fio2  Needs PT/OT  We appreciate SLP  OOB and UIC today if office  Prn bronchodilators    Garner Nash, DO [facility] 09/27/2019 2:01 PM

## 2019-09-27 NOTE — Progress Notes (Signed)
Pt currently off BIPAP and on 3 LPM [place], Pt tolerating well at this time.  BIPAP not indicated at this time, RT to monitor and assess as needed.

## 2019-09-27 NOTE — Progress Notes (Signed)
  Speech Language Pathology Treatment: Dysphagia  Patient Details Name: Patricia Mata MRN: [id number] DOB: [birth year] Today's Date: 09/27/2019 Time: 1005-1040 SLP Time Calculation (min) (ACUTE ONLY): 35 min  Assessment / Plan / Recommendation Clinical Impression  Pt today seen to assess readiness for po intake. She is fully alert with improved phonation = most notably after minimal intake.  She passed 3 ounce yale water test surprisingly.  Note facial asymmetry present which pt reports has been since birth *(upper and lower facial nerve).  Pt able to self feed ice chips, nectar thick juice, applesauce, cereal with water, and thin water.  Synchronous swallow/respiration appeared present without pt demonstrating increased work of breathing.  After initial swallows of solids, pt with frequent dry belching - which abated.   Reflexive cough noted x4 during entire session of approximately 30 minutes with pt coughing and expectorating viscous brown tinged secretions. RN provided pt with po medications with applesauce without pt demonstrating difficulties.She naturally eats slowly.  Minimal oral retention noted of cereal that pt required min cues to note.  Advised she swish and expectorate with water after po.    Recommend regular/thin diet as acute dysphagia s/p intubation appears to have resolved.  Will follow up x1 to assure tolerance and assess for instrumental evaluation indication.  Educated pt to findings/recommendations.      HPI HPI: This is a 64 yo with history of IDDM, HTN, CKD, Pulm HTN, gout, colon cancer who presents for recurrence of pna like symptoms after treatment for CAP for 5 days on previous admission from 3/02-3/05.  Pt was seen by ST on11/11/2017 for a BSE with recommendations for regular solids and thin liquids.  Pt was intubated from 09/21/19- 09/25/19.   She underwent BSE yesterday with recommendation for npo x ice chips. Today follow up indicated to assess readiness for po.      SLP Plan  Continue with current plan of care       Recommendations  Diet recommendations: Regular;Thin liquid  Lactose free per pt Liquids provided via: Cup;Straw Medication Administration: Whole meds with puree Supervision: Patient able to self feed Compensations: Slow rate;Small sips/bites;Other (Comment)(swish and expectorate with water after meals) Postural Changes and/or Swallow Maneuvers: Seated upright 90 degrees;Upright 30-60 min after meal                Oral Care Recommendations: Oral care QID;Staff/trained caregiver to provide oral care Follow up Recommendations: Other (comment)(TBD) SLP Visit Diagnosis: Dysphagia, unspecified (R13.10) Plan: Continue with current plan of care       [place], Patricia Mata 09/27/2019, 11:00 AM  Patricia Lime, MS [place] Office [phone]

## 2019-09-27 NOTE — Progress Notes (Signed)
CPT held. Patient in chair; flutter not done in place due to patient bleeding through several tissues and band-aid. RN aware.

## 2019-09-27 NOTE — Progress Notes (Signed)
D/w primary team.  Patient progressing clinically.  Hold on palliative involvement at this time.  Please reconsult if we can be of assistance in the care of Patricia Mata.  Micheline Rough, MD [place] [facility] Team [phone]  NO CHARGE NOTE

## 2019-09-27 NOTE — Progress Notes (Signed)
Pt had hypoglycemic episode,CBG was 55, juice was given and recheck CBG was 86. E-link notified. RN will continue to monitor.

## 2019-09-28 ENCOUNTER — Ambulatory Visit (HOSPITAL_COMMUNITY): Payer: PPO

## 2019-09-28 LAB — BASIC METABOLIC PANEL
Anion gap: 7 (ref 5–15)
BUN: 63 mg/dL — ABNORMAL HIGH (ref 8–23)
CO2: 25 mmol/L (ref 22–32)
Calcium: 9.2 mg/dL (ref 8.9–10.3)
Chloride: 111 mmol/L (ref 98–111)
Creatinine, Ser: 2.14 mg/dL — ABNORMAL HIGH (ref 0.44–1.00)
GFR calc Af Amer: 27 mL/min — ABNORMAL LOW (ref >60)
GFR calc non Af Amer: 24 mL/min — ABNORMAL LOW (ref >60)
Glucose, Bld: 73 mg/dL (ref 70–99)
Potassium: 3.9 mmol/L (ref 3.5–5.1)
Sodium: 143 mmol/L (ref 135–145)

## 2019-09-28 LAB — CBC
HCT: 24.2 % — ABNORMAL LOW (ref 36.0–46.0)
Hemoglobin: 7.5 g/dL — ABNORMAL LOW (ref 12.0–15.0)
MCH: 29.1 pg (ref 26.0–34.0)
MCHC: 31 g/dL (ref 30.0–36.0)
MCV: 93.8 fL (ref 80.0–100.0)
Platelets: 257 10*3/uL (ref 150–400)
RBC: 2.58 MIL/uL — ABNORMAL LOW (ref 3.87–5.11)
RDW: 21 % — ABNORMAL HIGH (ref 11.5–15.5)
WBC: 10.6 10*3/uL — ABNORMAL HIGH (ref 4.0–10.5)
nRBC: 0.8 % — ABNORMAL HIGH (ref 0.0–0.2)

## 2019-09-28 LAB — GLUCOSE, CAPILLARY
Glucose-Capillary: 60 mg/dL — ABNORMAL LOW (ref 70–99)
Glucose-Capillary: 61 mg/dL — ABNORMAL LOW (ref 70–99)
Glucose-Capillary: 96 mg/dL (ref 70–99)
Glucose-Capillary: 183 mg/dL — ABNORMAL HIGH (ref 70–99)
Glucose-Capillary: 123 mg/dL — ABNORMAL HIGH (ref 70–99)
Glucose-Capillary: 203 mg/dL — ABNORMAL HIGH (ref 70–99)
Glucose-Capillary: 91 mg/dL (ref 70–99)
Glucose-Capillary: 185 mg/dL — ABNORMAL HIGH (ref 70–99)
Glucose-Capillary: 198 mg/dL — ABNORMAL HIGH (ref 70–99)

## 2019-09-28 LAB — MAGNESIUM: Magnesium: 1.6 mg/dL — ABNORMAL LOW (ref 1.7–2.4)

## 2019-09-28 LAB — PHOSPHORUS: Phosphorus: 3.3 mg/dL (ref 2.5–4.6)

## 2019-09-28 MED ORDER — ENSURE ENLIVE PO LIQD
237.0000 mL | Freq: Two times a day (BID) | ORAL | Status: DC
  Administered 2019-09-28 – 2019-10-03 (×6): 237 mL via ORAL

## 2019-09-28 MED ORDER — MAGNESIUM SULFATE 2 GM/50ML IV SOLN
2.0000 g | Freq: Once | INTRAVENOUS | Status: AC
  Administered 2019-09-28: 2 g via INTRAVENOUS
  Filled 2019-09-28: qty 50

## 2019-09-28 MED ORDER — AMLODIPINE BESYLATE 10 MG PO TABS
10.0000 mg | ORAL_TABLET | Freq: Every day | ORAL | Status: DC
  Filled 2019-09-28: qty 1

## 2019-09-28 MED ORDER — IPRATROPIUM-ALBUTEROL 0.5-2.5 (3) MG/3ML IN SOLN
3.0000 mL | Freq: Three times a day (TID) | RESPIRATORY_TRACT | Status: DC
  Administered 2019-09-28 – 2019-09-29 (×3): 3 mL via RESPIRATORY_TRACT
  Filled 2019-09-28 (×3): qty 3

## 2019-09-28 MED ORDER — PRO-STAT SUGAR FREE PO LIQD
30.0000 mL | Freq: Two times a day (BID) | ORAL | Status: DC
  Administered 2019-09-28 – 2019-10-01 (×5): 30 mL via ORAL
  Filled 2019-09-28 (×7): qty 30

## 2019-09-28 MED ORDER — AMLODIPINE BESYLATE 5 MG PO TABS
5.0000 mg | ORAL_TABLET | Freq: Every day | ORAL | Status: DC
  Administered 2019-09-29 – 2019-10-03 (×5): 5 mg via ORAL
  Filled 2019-09-28 (×6): qty 1

## 2019-09-28 MED ORDER — ADULT MULTIVITAMIN W/MINERALS CH
1.0000 | ORAL_TABLET | Freq: Every day | ORAL | Status: DC
  Administered 2019-09-28 – 2019-10-03 (×6): 1 via ORAL
  Filled 2019-09-28 (×6): qty 1

## 2019-09-28 MED ORDER — INSULIN ASPART 100 UNIT/ML ~~LOC~~ SOLN
0.0000 [IU] | Freq: Three times a day (TID) | SUBCUTANEOUS | Status: DC
  Administered 2019-09-28 – 2019-09-29 (×4): 1 [IU] via SUBCUTANEOUS
  Administered 2019-09-29: 3 [IU] via SUBCUTANEOUS
  Administered 2019-09-30 – 2019-10-01 (×4): 2 [IU] via SUBCUTANEOUS
  Administered 2019-10-02 (×2): 3 [IU] via SUBCUTANEOUS
  Administered 2019-10-02 – 2019-10-03 (×2): 1 [IU] via SUBCUTANEOUS

## 2019-09-28 NOTE — Progress Notes (Signed)
Nutrition Follow-up  DOCUMENTATION CODES:   Not applicable  INTERVENTION:  - continue Ensure Enlive BID, each supplement provides 350 kcal and 20 grams of protein. - will order 30 mL Prostat BID, each supplement provides 100 kcal and 15 grams of protein. - will order daily multivitamin with minerals.    NUTRITION DIAGNOSIS:   Inadequate oral intake related to acute illness, decreased appetite as evidenced by per patient/family report. -revised, ongoing   GOAL:   Patient will meet greater than or equal to 90% of their needs -unmet  MONITOR:   PO intake, Supplement acceptance, Labs, Weight trends  ASSESSMENT:   64 y.o. female with medical history of HTN, DM, MDS, pulmonary HTN, anemia of CKD, chronic afib, gout, and hypothyroidism. She was diagnosed with COVID-19 >6 months ago. She presented to the ED with worsening SOB since d/c on 3/5 (admission was for CAP). She reported CBGs in the 200s since d/c.  Patient was extubated and OGT removed on 3/13 at ~1115. Estimated nutrition needs adjusted based on this. Diet was advanced from NPO to Carb Modified, thin liquid yesterday at 1115 and no intakes documented since that time. Patient reports that appetite has not yet returned to baseline, but that she has been able to eat at least 50% at meals and that she is drinking Ensure when it is provided to her.    Labs reviewed; CBGs: 61, 60, 96, and 183 mg/dl, BUN: 63 mg/dl, creatinine: 2.14 mg/dl, Mg: 1.6 mg/dl, GFR: 27 ml/min. Medications reviewed; sliding scale novolog, 88 mcg oral synthroid/day, 2 g IV Mg sulfate x1 dose 3/16, daily multivitamin with minerals, 5 ml senokot BID.    Diet Order:   Diet Order            Diet Carb Modified Fluid consistency: Thin; Room service appropriate? Yes  Diet effective now              EDUCATION NEEDS:   No education needs have been identified at this time  Skin:  Skin Assessment: Reviewed RN Assessment  Last BM:  PTA/unknown  Height:    Ht Readings from Last 1 Encounters:  09/21/19 5\' 4"  (1.626 m)    Weight:   Wt Readings from Last 1 Encounters:  09/28/19 85 kg    Ideal Body Weight:  54.5 kg  BMI:  Body mass index is 32.17 kg/m.  Estimated Nutritional Needs:   Kcal:  2040-2285 kcal  Protein:  95-110 grams  Fluid:  >/= 1.8 L/day     Jarome Matin, MS, RD, LDN, CNSC Inpatient Clinical Dietitian RD pager # available in [facility]  After hours/weekend pager # available in [facility]

## 2019-09-28 NOTE — Progress Notes (Signed)
  Speech Language Pathology Treatment: Dysphagia  Patient Details Name: Patricia Mata MRN: [id number] DOB: 1956-06-20 Today's Date: 09/28/2019 Time: 5830-9407 SLP Time Calculation (min) (ACUTE ONLY): 22 min  Assessment / Plan / Recommendation Clinical Impression  Pt seen today to assess po tolearance and for family/pt education.  Pt sitting up in bed with improved phonatory strength, She reports 8/10 strength. Functional meal observation completed with pt eating slowly today and not demonstrating oral retention/residuals as noted during prior session.  No indication of dysphagia apparent.  SLP educated pt and her spouse to swallow precautions, dysphagia symptoms and heimlich maneuver review.  No SLP follow up indicated as pt's swallow ability is at baseline.    HPI HPI: This is a 64 yo with history of IDDM, HTN, CKD, Pulm HTN, gout, colon cancer who presents for recurrence of pna like symptoms after treatment for CAP for 5 days on previous admission from 3/02-3/05.  Pt was seen by ST on11/11/2017 for a BSE with recommendations for regular solids and thin liquids.  Pt was intubated from 09/21/19- 09/25/19.   She underwent BSE yesterday with recommendation for npo x ice chips. Today follow up indicated to assess readiness for po.  Pt was started on a po diet yesterday and follow up indicated today to assure managing intake and for education.      SLP Plan   no follow up, all goals met Regular/thin diet       Recommendations   dc SLP                        GO               Kathleen Lime, MS [facility] SLP Acute Rehab Services Office [phone]  Macario Golds 09/28/2019, 2:58 PM

## 2019-09-28 NOTE — Progress Notes (Addendum)
PROGRESS NOTE    Patricia Mata  LOV:[id number] DOB: [birth year] DOA: 09/20/2019 PCP: [name], FNP   Brief Narrative: 64 year old with past medical history significant for insulin-dependent diabetes, hypertension, CKD, pulmonary hypertension, colon cancer who presents with recurrence of pneumonia-like symptoms after treated for CAP for 5 days on previous admission from 3/2 until 3/5.  Patient admitted with acute hypoxic respiratory failure secondary to pneumonia.  Patient requires significant oxygen supplementation, subsequently she was intubated on 3/9 and extubated 3-13.  She has required BiPAP at night.  She completed a course of cefepime, 7 days to treat pneumonia.  She passed a swallow evaluation and is currently on carb modified diet.  Assessment & Plan:   Active Problems:   Anemia due to chronic kidney disease   MDS (myelodysplastic syndrome) (HCC)   Essential hypertension   Hyperlipidemia   Severe aortic stenosis   Normocytic anemia   Atrial fibrillation, transient (HCC)   Acute hypoxemic respiratory failure (HCC)   Type 2 diabetes mellitus with hemoglobin A1c goal of less than 7.0% (HCC)   Hypothyroidism   Acute respiratory failure with hypoxia (HCC)   HCAP (healthcare-associated pneumonia)   Pulmonary hypertension (HCC)   CKD (chronic kidney disease), stage III   DM2 (diabetes mellitus, type 2) (HCC)   Elevated troponin   Severe sepsis (HCC)   ARDS (adult respiratory distress syndrome) (HCC)   Acute diastolic CHF (congestive heart failure) ([place])  1-Acute hypoxic respiratory failure requiring mechanical ventilation this admission from 3/9 on ~3/13.  ARDS.  Treated for Covid pneumonia on 03/18/2019. Completed course of cefepime White blood cell has decreased to 11 from 20.  2-Acute hypoxic respiratory failure Continue with BiPAP as needed Supplemental oxygen, 3 L Continue with incentive spirometry  3-Pulmonary hypertension, continue with sildenafil Continue to hold  Lasix for now.  DM, with hypoglycemia;  Change SSI to sensitive. Hold lantus.   CKD stage  IIIb Prior cr 1.8 Cr today at 2.1 peak to 2.5 this admission.  Negative 7 L. Holding lasix.   Hypertension: Continue with amlodipine Avoid  vasodilators due to severe aortic stenosis  Anemia of chronic diseases; repeat hb in am.  Decreased to 7.5   Left adrenal nodule: Need to follow-up  Diabetes: Continue with Lantus and a sliding scale insulin  Elevated troponin: Thought to be related to demand. 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  A. fib: Cardiology recommended to resume amiodarone.  Nutrition Problem: Inadequate oral intake Etiology: inability to eat    Signs/Symptoms: NPO status    Interventions: Refer to RD note for recommendations  Estimated body mass index is 32.17 kg/m as calculated from the following:   Height as of this encounter: _0  (1.626 m).   Weight as of this encounter: 85 kg.   DVT prophylaxis: Heparin Code Status: Full code Family Communication: Disposition Plan:  Patient is from: Home Anticipated d/c date: 2 or 3 days respiratory status stable Barriers to d/c or necessity for inpatient status: Awaiting improvement of respiratory status, monitor oral intake, monitor renal function  Consultants:   CM  Cardiology  Procedures:   Mechanical intubation from 3/9 until 3/13  Antimicrobials:  Completed course of cefepime for 7 days  Subjective: Patient is alert, conversant.  She report no chest pain.  She is breathing better.  Still coughing  Objective: Vitals:   09/28/19 0400 09/28/19 0425 09/28/19 0600 09/28/19 0642  BP: (!) 145/39  (!) 140/39 Marland Kitchen)  159/31  Pulse: 71  70   Resp: _0 Temp: 97.8 F (36.6 C)     TempSrc: Axillary     SpO2: 100%     Weight:  85 kg    Height:        Intake/Output Summary (Last 24 hours) at 09/28/2019 0802 Last data filed at  09/28/2019 0600 Gross per 24 hour  Intake 0 ml  Output 1475 ml  Net -1475 ml   Filed Weights   09/26/19 0436 09/27/19 0500 09/28/19 0425  Weight: 87.2 kg 82.8 kg 85 kg    Examination:  General exam: Appears calm and comfortable  Respiratory system: Bilateral rhonchorous. Respiratory effort normal. Cardiovascular system: S1 & S2 heard, RRR. No JVD, murmurs, rubs, gallops or clicks. No pedal edema. Gastrointestinal system: Abdomen is nondistended, soft and nontender. No organomegaly or masses felt. Normal bowel sounds heard. Central nervous system: Alert and oriented.  Extremities: Symmetric 5 x 5 power. Skin: No rashes, lesions or ulcers    Data Reviewed: I have personally reviewed following labs and imaging studies  CBC: Recent Labs  Lab 09/22/19 0300 09/22/19 0300 09/23/19 0511 09/23/19 0511 09/24/19 0610 09/25/19 0448 09/26/19 0343 09/27/19 0557 09/28/19 0424  WBC 30.2*   < > 11.7*   < > 11.6* 19.0* 20.6* 13.2* 10.6*  NEUTROABS 24.8*  --  9.3*  --  8.8* 14.0* 15.7*  --   --   HGB 7.0*   < > 6.5*   < > 6.9* 7.3* 7.9* 8.0* 7.5*  HCT 22.5*   < > 19.8*   < > 21.6* 22.8* 25.0* 24.8* 24.2*  MCV 95.3   < > 93.0   < > 92.3 92.3 92.3 94.3 93.8  PLT 329   < > 239   < > 213 240 263 256 257   < > = values in this interval not displayed.   Basic Metabolic Panel: Recent Labs  Lab 09/22/19 0300 09/22/19 0300 09/23/19 0511 09/23/19 0511 09/24/19 0610 09/25/19 0448 09/26/19 0343 09/27/19 0557 09/28/19 0424  NA 136   < > 137   < > 140 139 145 145 143  K 4.5   < > 3.7   < > 3.9 4.2 4.2 4.4 3.9  CL 104   < > 106   < > 110 110 111 110 111  CO2 23   < > 20*   < > 20* 20* _1 GLUCOSE 265*   < > 86   < > 131* 214* 97 105* 73  BUN 59*   < > 60*   < > 68* 87* 74* 68* 63*  CREATININE 2.49*   < > 2.54*   < > 2.45* 2.57* 2.37* 2.15* 2.14*  CALCIUM 8.4*   < > 8.3*   < > 8.6* 8.7* 9.1 9.2 9.2  MG 1.9  --  1.8  --  1.8 1.8  --   --  1.6*  PHOS 7.0*  --  3.2  --  2.6 2.7  --    --  3.3   < > = values in this interval not displayed.   GFR: Estimated Creatinine Clearance: 28 mL/min (A) (by C-G formula based on SCr of 2.14 mg/dL (H)). Liver Function Tests: Recent Labs  Lab 09/22/19 0300 09/23/19 0511 09/24/19 0610  AST _2 ALT _3 ALKPHOS 114 93 106  BILITOT 1.2 1.3* 1.0  PROT 6.1* 6.0* 5.7*  ALBUMIN 2.3* 2.2*  2.1*   No results for input(s): LIPASE, AMYLASE in the last 168 hours. No results for input(s): AMMONIA in the last 168 hours. Coagulation Profile: No results for input(s): INR, PROTIME in the last 168 hours. Cardiac Enzymes: No results for input(s): CKTOTAL, CKMB, CKMBINDEX, TROPONINI in the last 168 hours. BNP (last 3 results) No results for input(s): PROBNP in the last 8760 hours. HbA1C: No results for input(s): HGBA1C in the last 72 hours. CBG: Recent Labs  Lab 09/27/19 1948 09/27/19 2355 09/28/19 0354 09/28/19 0412 09/28/19 0434  GLUCAP 86 232* 61* 60* 96   Lipid Profile: No results for input(s): CHOL, HDL, LDLCALC, TRIG, CHOLHDL, LDLDIRECT in the last 72 hours. Thyroid Function Tests: No results for input(s): TSH, T4TOTAL, FREET4, T3FREE, THYROIDAB in the last 72 hours. Anemia Panel: No results for input(s): VITAMINB12, FOLATE, FERRITIN, TIBC, IRON, RETICCTPCT in the last 72 hours. Sepsis Labs: No results for input(s): PROCALCITON, LATICACIDVEN in the last 168 hours.  Recent Results (from the past 240 hour(s))  Culture, blood (single) w Reflex to ID Panel     Status: None   Collection Time: 09/20/19  6:23 PM   Specimen: BLOOD  Result Value Ref Range Status   Specimen Description   Final    BLOOD RIGHT ANTECUBITAL Performed at [facility] [address], [place], [place] [address]    Special Requests   Final    BOTTLES DRAWN AEROBIC AND ANAEROBIC Blood Culture adequate volume Performed at [facility] [address], [place], [place] [address]    Culture   Final    NO  GROWTH 5 DAYS Performed at [facility], [place] [address], [place], [place] [address]    Report Status 09/25/2019 FINAL  Final  SARS CORONAVIRUS 2 (TAT 6-24 HRS) Nasopharyngeal Nasopharyngeal Swab     Status: None   Collection Time: 09/20/19  8:03 PM   Specimen: Nasopharyngeal Swab  Result Value Ref Range Status   SARS Coronavirus 2 NEGATIVE NEGATIVE Final    Comment: (NOTE) SARS-CoV-2 target nucleic acids are NOT DETECTED. The SARS-CoV-2 RNA is generally detectable in upper and lower respiratory specimens during the acute phase of infection. Negative results do not preclude SARS-CoV-2 infection, do not rule out co-infections with other pathogens, and should not be used as the sole basis for treatment or other patient management decisions. Negative results must be combined with clinical observations, patient history, and epidemiological information. The expected result is Negative. Fact Sheet for Patients: SugarRoll.be Fact Sheet for Healthcare Providers: https://www.woods-mathews.com/ This test is not yet approved or cleared by the [place] FDA and  has been authorized for detection and/or diagnosis of SARS-CoV-2 by FDA under an Emergency Use Authorization (EUA). This EUA will remain  in effect (meaning this test can be used) for the duration of the COVID-19 declaration under Section 56 4(b)(1) of the Act, 21 U.S.C. section 360bbb-3(b)(1), unless the authorization is terminated or revoked sooner. Performed at [facility], [place] [address] [place], [place] [address]   Respiratory Panel by PCR     Status: None   Collection Time: 09/20/19  8:03 PM   Specimen: Nasopharyngeal Swab; Respiratory  Result Value Ref Range Status   Adenovirus NOT DETECTED NOT DETECTED Final   Coronavirus 229E NOT DETECTED NOT DETECTED Final    Comment: (NOTE) The Coronavirus on the Respiratory Panel, DOES NOT test for the novel  Coronavirus (2019  nCoV)    Coronavirus HKU1 NOT DETECTED NOT DETECTED Final  Coronavirus NL63 NOT DETECTED NOT DETECTED Final   Coronavirus OC43 NOT DETECTED NOT DETECTED Final   Metapneumovirus NOT DETECTED NOT DETECTED Final   Rhinovirus / Enterovirus NOT DETECTED NOT DETECTED Final   Influenza A NOT DETECTED NOT DETECTED Final   Influenza B NOT DETECTED NOT DETECTED Final   Parainfluenza Virus 1 NOT DETECTED NOT DETECTED Final   Parainfluenza Virus 2 NOT DETECTED NOT DETECTED Final   Parainfluenza Virus 3 NOT DETECTED NOT DETECTED Final   Parainfluenza Virus 4 NOT DETECTED NOT DETECTED Final   Respiratory Syncytial Virus NOT DETECTED NOT DETECTED Final   Bordetella pertussis NOT DETECTED NOT DETECTED Final   Chlamydophila pneumoniae NOT DETECTED NOT DETECTED Final   Mycoplasma pneumoniae NOT DETECTED NOT DETECTED Final    Comment: Performed at [facility], [place] [address] [place], [place] [address]  Culture, sputum-assessment     Status: None   Collection Time: 09/20/19 11:02 PM   Specimen: Expectorated Sputum  Result Value Ref Range Status   Specimen Description EXPECTORATED SPUTUM  Final   Special Requests NONE  Final   Sputum evaluation   Final    THIS SPECIMEN IS ACCEPTABLE FOR SPUTUM CULTURE Performed at [facility], [place] [address] [place], [place] [address]    Report Status 09/21/2019 FINAL  Final  Culture, respiratory     Status: None   Collection Time: 09/20/19 11:02 PM  Result Value Ref Range Status   Specimen Description   Final    EXPECTORATED SPUTUM Performed at [facility] [address] [place], [place] [address]    Special Requests   Final    NONE Reflexed from [id number] Performed at [facility], [place] [address] [place], [place] [address]    Gram Stain   Final    FEW WBC PRESENT, PREDOMINANTLY PMN RARE GRAM POSITIVE COCCI    Culture   Final    Consistent with normal respiratory flora. Performed at [facility], [place] [address], [place], [place] [address]    Report Status 09/23/2019 FINAL  Final  MRSA PCR Screening     Status: None   Collection Time: 09/21/19  2:20 AM   Specimen: Nasal Mucosa; Nasopharyngeal  Result Value Ref Range Status   MRSA by PCR NEGATIVE NEGATIVE Final    Comment:        The GeneXpert MRSA Assay (FDA approved for NASAL specimens only), is one component of a comprehensive MRSA colonization surveillance program. It is not intended to diagnose MRSA infection nor to guide or monitor treatment for MRSA infections. Performed at [facility], [place] [address] [place], [place] [address]   Culture, bal-quantitative     Status: None   Collection Time: 09/22/19  3:12 PM   Specimen: Bronchoalveolar Lavage; Respiratory  Result Value Ref Range Status   Specimen Description   Final    BRONCHIAL ALVEOLAR LAVAGE Performed at [facility] [address], [place], [place] [address]    Special Requests   Final    Normal Performed at [facility], [place] [address] [place], [place] [address]    Gram Stain   Final    FEW WBC PRESENT,BOTH PMN AND MONONUCLEAR NO ORGANISMS SEEN    Culture   Final    NO GROWTH 2 DAYS Performed at [facility], [place] [address] [place], [place] [address]    Report Status 09/24/2019 FINAL  Final  Acid Fast Smear (  AFB)     Status: None   Collection Time: 09/22/19  3:12 PM   Specimen: Bronchial Alveolar Lavage  Result Value Ref Range Status   AFB Specimen Processing Concentration  Final   Acid Fast Smear Negative  Final    Comment: (NOTE) Performed At: [facility] [place], [place] [id number] Rush Farmer MD [id number]    Source (AFB) BRONCHIAL ALVEOLAR LAVAGE  Final    Comment: Performed at [facility], [place] [address], [place], [place] [address]  Fungus Culture With Stain     Status: None (Preliminary result)   Collection  Time: 09/22/19  3:12 PM   Specimen: Bronchial Alveolar Lavage  Result Value Ref Range Status   Fungus Stain Final report  Final    Comment: (NOTE) Performed At: [facility] [place], [place] [id number] Rush Farmer MD [id number]    Fungus (Mycology) Culture PENDING  Incomplete   Fungal Source BRONCHIAL ALVEOLAR LAVAGE  Final    Comment: Performed at [facility], [place] [address] [place], [place] [address]  Novel Coronavirus, NAA [facility] order, Send-out to Ref Lab; TAT 18-24 hrs     Status: None   Collection Time: 09/22/19  3:12 PM  Result Value Ref Range Status   SARS-CoV-2, NAA NOT DETECTED NOT DETECTED Final    Comment: (NOTE) This nucleic acid amplification test was developed and its performance characteristics determined by [employer]. Nucleic acid amplification tests include RT-PCR and TMA. This test has not been FDA cleared or approved. This test has been authorized by FDA under an Emergency Use Authorization (EUA). This test is only authorized for the duration of time the declaration that circumstances exist justifying the authorization of the emergency use of in vitro diagnostic tests for detection of SARS-CoV-2 virus and/or diagnosis of COVID-19 infection under section 564(b)(1) of the Act, 21 U.S.C. 326ZTI-4(P) (1), unless the authorization is terminated or revoked sooner. When diagnostic testing is negative, the possibility of a false negative result should be considered in the context of a patient's recent exposures and the presence of clinical signs and symptoms consistent with COVID-19. An individual without symptoms of COVID- 19 and who is not shedding SARS-CoV-2  virus would expect to have a negative (not detected) result in this assay. Performed At: [facility] [address] [place], [place] [id number] Rush Farmer MD [id number]    [place]  Final     Comment: Performed at [facility] [address] [place], [place] [address]  Fungus Culture Result     Status: None   Collection Time: 09/22/19  3:12 PM  Result Value Ref Range Status   Result 1 Comment  Final    Comment: (NOTE) KOH/Calcofluor preparation:  no fungus observed. Performed At: [facility] [place], [place] [id number] Rush Farmer MD [id number]   Pneumocystis smear by DFA     Status: None   Collection Time: 09/22/19  3:15 PM   Specimen: Bronchoalveolar Lavage; Respiratory  Result Value Ref Range Status   Specimen Source-[facility] BRONCHIAL ALVEOLAR LAVAGE  Final   Pneumocystis jiroveci Ag NEGATIVE  Final    Comment: Performed at Home Performed at [facility] [address] [place], [place] [address]   Culture, respiratory (non-expectorated)     Status: None   Collection Time: 09/24/19 11:44 AM   Specimen: Tracheal Aspirate; Respiratory  Result Value Ref Range Status  Specimen Description   Final    TRACHEAL ASPIRATE Performed at [facility] [address] [place], [place] [address]    Special Requests   Final    NONE Performed at [facility], [place] [address] [place], [place] [address]    Gram Stain   Final    ABUNDANT WBC PRESENT, PREDOMINANTLY PMN NO ORGANISMS SEEN Performed at [facility], [place] [address], [place], [place] [address]    Culture FEW CANDIDA ALBICANS  Final   Report Status 09/27/2019 FINAL  Final         Radiology Studies: No results found.      Scheduled Meds: . allopurinol  100 mg Oral Daily  . amiodarone  200 mg Oral Daily  . amLODipine  5 mg Oral Daily  . atorvastatin  40 mg Oral q1800  . Chlorhexidine Gluconate Cloth  6 each Topical Daily  . heparin injection (subcutaneous)  5,000 Units Subcutaneous Q8H  . hydrocerin   Topical BID  . insulin aspart  0-20 Units Subcutaneous Q4H  .  insulin glargine  5 Units Subcutaneous Q2200  . ipratropium-albuterol  3 mL Nebulization QID  . latanoprost  1 drop Both Eyes QHS  . levothyroxine  88 mcg Oral Q0600  . mouth rinse  15 mL Mouth Rinse BID  . sennosides  5 mL Oral BID  . sildenafil  20 mg Oral TID  . sodium chloride flush  10-40 mL Intracatheter Q12H  . sodium chloride flush  3 mL Intravenous Q12H   Continuous Infusions: . sodium chloride Stopped (09/21/19 0756)  . sodium chloride    . feeding supplement (VITAL HIGH PROTEIN) Stopped (09/25/19 1030)     LOS: 8 days    Time spent: 35 minutes.     Elmarie Shiley, MD [facility]   If 7PM-7AM, please contact night-coverage [email or web address]  09/28/2019, 8:02 AM

## 2019-09-28 NOTE — Progress Notes (Signed)
Occupational Therapy Evaluation Patient Details Name: Patricia Mata MRN: [id number] DOB: 03-16-1956 Today's Date: 09/28/2019    History of Present Illness This is a 64 yo with history of IDDM, HTN, CKD, Pulm HTN, gout, colon cancer who presents for recurrence of pna like symptoms after treatment for CAP for 5 days on previous admisison from 3/02-3/05.Marland Kitchen required intubation, extubated 09/25/19.   Clinical Impression   Patient reports living in a single story home with spouse and was Independent with self-care tasks and mobilization at [facility]. Overall patient requires set-up to Max A for self-care tasks and Moderate A x 2 for functional transfers. Patient's O2 stats in the 90s throughout the evaluation on 3L with cues for PLB throughout the evaluation. Patient verbalized dizziness when sitting EOB, but resolved after about 30 seconds. Patient was able to don UE gown and complete grooming while in sitting position. Moderate A x 2 for stand pivot bed to recliner transfer. Pt will benefit from acute skilled OT services to decrease level of assist upon d/c.     Follow Up Recommendations  Home health OT;Supervision/Assistance - 24 hour    Equipment Recommendations  3 in 1 bedside commode    Recommendations for Other Services       Precautions / Restrictions Precautions Precautions: Fall Precaution Comments: watch O2 stats Restrictions Weight Bearing Restrictions: No      Mobility Bed Mobility Overal bed mobility: Needs Assistance Bed Mobility: Supine to Sit     Supine to sit: Min assist     General bed mobility comments: with trunk management usign bed rail   Transfers Overall transfer level: Needs assistance Equipment used: 2 person hand held assist   Sit to Stand: Mod assist Stand pivot transfers: Mod assist            Balance   Sitting-balance support: Bilateral upper extremity supported;Feet supported Sitting balance-Leahy Scale: Fair     Standing balance support:  Bilateral upper extremity supported;During functional activity Standing balance-Leahy Scale: Poor                             ADL either performed or assessed with clinical judgement   ADL Overall ADL's : Needs assistance/impaired Eating/Feeding: Supervision/ safety;Set up   Grooming: Oral care;Wash/dry face;Wash/dry hands;Set up;Supervision/safety   Upper Body Bathing: Set up   Lower Body Bathing: Maximal assistance   Upper Body Dressing : Minimal assistance   Lower Body Dressing: Maximal assistance   Toilet Transfer: +2 for physical assistance;Moderate assistance;BSC   Toileting- Clothing Manipulation and Hygiene: Maximal assistance       Functional mobility during ADLs: +2 for physical assistance;Moderate assistance       Vision Baseline Vision/History: Wears glasses Wears Glasses: At all times       Perception     Praxis      Pertinent Vitals/Pain Pain Assessment: No/denies pain     Hand Dominance Left   Extremity/Trunk Assessment Upper Extremity Assessment Upper Extremity Assessment: RUE deficits/detail;LUE deficits/detail RUE: (Grossly 3+/5, AROM WFL) LUE: (Grossly 3+/5, AROM WFL)   Lower Extremity Assessment Lower Extremity Assessment: Defer to PT evaluation       Communication Communication Communication: No difficulties   Cognition Arousal/Alertness: Awake/alert Behavior During Therapy: WFL for tasks assessed/performed Overall Cognitive Status: Impaired/Different from baseline Area of Impairment: Orientation                 Orientation Level: Time;Situation Current Attention Level: Sustained  Problem Solving: Slow processing     General Comments  O2 stats in the 90s on 3L    Exercises     Shoulder Instructions      Home Living Family/patient expects to be discharged to:: Private residence Living Arrangements: Spouse/significant other Available Help at Discharge: Family Type of Home: House Home  Access: Stairs to enter Technical brewer of Steps: 4 Entrance Stairs-Rails: Can reach both;Left;Right Home Layout: One level     Bathroom Shower/Tub: Occupational psychologist: Handicapped height Bathroom Accessibility: Yes How Accessible: Accessible via walker Home Equipment: Grab bars - tub/shower          Prior Functioning/Environment Level of Independence: Independent        Comments: prior to hospital        OT Problem List: Decreased strength;Decreased activity tolerance;Impaired balance (sitting and/or standing);Decreased safety awareness;Cardiopulmonary status limiting activity      OT Treatment/Interventions: Self-care/ADL training;Therapeutic exercise;Therapeutic activities;Patient/family education;Balance training;DME and/or AE instruction;Energy conservation    OT Goals(Current goals can be found in the care plan section) Acute Rehab OT Goals Patient Stated Goal: to get OOB OT Goal Formulation: With patient Time For Goal Achievement: 10/12/19 Potential to Achieve Goals: Good  OT Frequency: Min 2X/week   Barriers to D/C:            Co-evaluation              AM-PAC OT "6 Clicks" Daily Activity     Outcome Measure Help from another person eating meals?: A Little Help from another person taking care of personal grooming?: A Little Help from another person toileting, which includes using toliet, bedpan, or urinal?: A Lot Help from another person bathing (including washing, rinsing, drying)?: A Lot Help from another person to put on and taking off regular upper body clothing?: A Little Help from another person to put on and taking off regular lower body clothing?: A Lot 6 Click Score: 15   End of Session Equipment Utilized During Treatment: Oxygen Nurse Communication: Mobility status  Activity Tolerance: Patient limited by fatigue Patient left: in chair;with call bell/phone within reach;with chair alarm set  OT Visit Diagnosis:  Unsteadiness on feet (R26.81);Muscle weakness (generalized) (M62.81)                Time: 4650-3546 OT Time Calculation (min): 39 min Charges:  OT General Charges $OT Visit: 1 Visit OT Evaluation $OT Eval Moderate Complexity: 1 Mod OT Treatments $Self Care/Home Management : 8-22 mins $Therapeutic Activity: 8-22 mins  Cherry Wittwer OTR/L   Tieisha Darden 09/28/2019, 9:28 AM

## 2019-09-29 ENCOUNTER — Inpatient Hospital Stay: Payer: PPO

## 2019-09-29 ENCOUNTER — Telehealth: Payer: Self-pay | Admitting: Oncology

## 2019-09-29 DIAGNOSIS — J9621 Acute and chronic respiratory failure with hypoxia: Secondary | ICD-10-CM

## 2019-09-29 LAB — GLUCOSE, CAPILLARY
Glucose-Capillary: 211 mg/dL — ABNORMAL HIGH (ref 70–99)
Glucose-Capillary: 130 mg/dL — ABNORMAL HIGH (ref 70–99)
Glucose-Capillary: 135 mg/dL — ABNORMAL HIGH (ref 70–99)
Glucose-Capillary: 270 mg/dL — ABNORMAL HIGH (ref 70–99)
Glucose-Capillary: 160 mg/dL — ABNORMAL HIGH (ref 70–99)
Glucose-Capillary: 157 mg/dL — ABNORMAL HIGH (ref 70–99)

## 2019-09-29 LAB — PREPARE RBC (CROSSMATCH)

## 2019-09-29 MED ORDER — POTASSIUM CHLORIDE CRYS ER 20 MEQ PO TBCR
20.0000 meq | EXTENDED_RELEASE_TABLET | Freq: Once | ORAL | Status: AC
  Administered 2019-09-29: 20 meq via ORAL
  Filled 2019-09-29: qty 1

## 2019-09-29 MED ORDER — FUROSEMIDE 40 MG PO TABS
40.0000 mg | ORAL_TABLET | Freq: Every day | ORAL | Status: DC
  Administered 2019-09-29 – 2019-09-30 (×2): 40 mg via ORAL
  Filled 2019-09-29 (×2): qty 1

## 2019-09-29 MED ORDER — IPRATROPIUM-ALBUTEROL 0.5-2.5 (3) MG/3ML IN SOLN: 3.0000 mL | Freq: Two times a day (BID) | RESPIRATORY_TRACT | Status: DC

## 2019-09-29 MED ORDER — SODIUM CHLORIDE 0.9% IV SOLUTION: Freq: Once | INTRAVENOUS | Status: DC

## 2019-09-29 NOTE — Progress Notes (Signed)
PROGRESS NOTE    Patricia Mata  HQI:[id number] DOB: 1955/09/02 DOA: 09/20/2019 PCP: [name], FNP   Brief Narrative: 64 year old with past medical history significant for insulin-dependent diabetes, hypertension, CKD, pulmonary hypertension, MDS, chronic anemia, colon cancer who presented with recurrence of pneumonia-like symptoms after treated for CAP for 5 days on previous admission from 3/2 until 3/5.  Patient admitted with acute hypoxic respiratory failure secondary to pneumonia.  Patient requires significant oxygen supplementation, subsequently she was intubated on 3/9 and extubated 3-13.  She has required BiPAP at night.  She completed a course of cefepime, 7 days to treat pneumonia.  She passed a swallow evaluation and is currently on carb modified diet.  Assessment & Plan:   Acute hypoxic respiratory failure requiring mechanical ventilation this admission from 3/9 on ~3/13.  ARDS.  - Treated for Covid pneumonia on 03/18/2019. -Completed antibiotics for healthcare associated pneumonia, cefepime -Improving slowly -Wean O2 as tolerated  -Pulmonary hypertension, continue with sildenafil -Resume Lasix  Acute on chronic diastolic CHF Severe aortic stenosis:  Moderate mitral regurgitation Paroxysmal atrial fibrillation -not a candidate for TAVR per cardiology note on 09/26/2019. -Resume oral Lasix -Not on anticoagulation per cardiology given recurrence transfusion dependent anemia -Continue oral amiodarone  Myelodysplasia Chronic anemia -Transfusion dependent, followed at the cancer center by Dr. Alen Blew -Hemoglobin has trended down to 7.5, will transfuse 1 unit of PRBC -Recent anemia panel suggestive of chronic disease  DM, with hypoglycemia;  -CBG stable now, continue sliding scale insulin, Lantus  CKD stage  IIIb -Baseline creatinine around 1.8, peaked to 2.5 this admission  -Now improving, creatinine is 2.1 today  -Resume p.o. Lasix   Hypertension: Continue with  amlodipine Avoid  vasodilators due to severe aortic stenosis  Anemia of chronic diseases; repeat hb in am.  Decreased to 7.5   Left adrenal nodule: Need to follow-up  Diabetes: Continue with Lantus and a sliding scale insulin   DVT prophylaxis: Heparin Code Status: Full code Family Communication: Disposition Plan: Transfer out of ICU to floor today, home with home health services likely 1- 2 days pending improvement in volume status, respiratory failure   Consultants:   CM  Cardiology  Procedures:   Mechanical intubation from 3/9 until 3/13  Antimicrobials:  Completed course of cefepime for 7 days  Subjective: -Reports breathing better  Objective: Vitals:   09/29/19 0500 09/29/19 0630 09/29/19 0806 09/29/19 0837  BP:    (!) 163/57  Pulse:    76  Resp:    (!) 22  Temp:  98.6 F (37 C)    TempSrc:  Oral    SpO2:   96% 93%  Weight: 86 kg     Height:        Intake/Output Summary (Last 24 hours) at 09/29/2019 1041 Last data filed at 09/29/2019 0900 Gross per 24 hour  Intake 30 ml  Output 950 ml  Net -920 ml   Filed Weights   09/27/19 0500 09/28/19 0425 09/29/19 0500  Weight: 82.8 kg 85 kg 86 kg    Examination: Gen: Awake, Alert, Oriented X 3, no distress HEENT: PERRLA, Neck supple, no JVD Lungs: few basilar rales CVS: S1-S2, regular rate rhythm, systolic ejection murmur Abd: soft, Non tender, non distended, BS present Extremities: No edema Skin: no new rashes on exposed skin    Data Reviewed: I have personally reviewed following labs and imaging studies  CBC: Recent Labs  Lab 09/23/19 0511 09/23/19 0511 09/24/19 0610 09/25/19 0448 09/26/19 0343 09/27/19 0557 09/28/19 0424  WBC  11.7*   < > 11.6* 19.0* 20.6* 13.2* 10.6*  NEUTROABS 9.3*  --  8.8* 14.0* 15.7*  --   --   HGB 6.5*   < > 6.9* 7.3* 7.9* 8.0* 7.5*  HCT 19.8*   < > 21.6* 22.8* 25.0* 24.8* 24.2*  MCV 93.0   < > 92.3 92.3 92.3 94.3 93.8  PLT 239   < > 213 240 263 256 257   < > =  values in this interval not displayed.   Basic Metabolic Panel: Recent Labs  Lab 09/23/19 0511 09/23/19 0511 09/24/19 0610 09/25/19 0448 09/26/19 0343 09/27/19 0557 09/28/19 0424  NA 137   < > 140 139 145 145 143  K 3.7   < > 3.9 4.2 4.2 4.4 3.9  CL 106   < > 110 110 111 110 111  CO2 20*   < > 20* 20* _0 GLUCOSE 86   < > 131* 214* 97 105* 73  BUN 60*   < > 68* 87* 74* 68* 63*  CREATININE 2.54*   < > 2.45* 2.57* 2.37* 2.15* 2.14*  CALCIUM 8.3*   < > 8.6* 8.7* 9.1 9.2 9.2  MG 1.8  --  1.8 1.8  --   --  1.6*  PHOS 3.2  --  2.6 2.7  --   --  3.3   < > = values in this interval not displayed.   GFR: Estimated Creatinine Clearance: 28.2 mL/min (A) (by C-G formula based on SCr of 2.14 mg/dL (H)). Liver Function Tests: Recent Labs  Lab 09/23/19 0511 09/24/19 0610  AST 21 18  ALT 12 9  ALKPHOS 93 106  BILITOT 1.3* 1.0  PROT 6.0* 5.7*  ALBUMIN 2.2* 2.1*   No results for input(s): LIPASE, AMYLASE in the last 168 hours. No results for input(s): AMMONIA in the last 168 hours. Coagulation Profile: No results for input(s): INR, PROTIME in the last 168 hours. Cardiac Enzymes: No results for input(s): CKTOTAL, CKMB, CKMBINDEX, TROPONINI in the last 168 hours. BNP (last 3 results) No results for input(s): PROBNP in the last 8760 hours. HbA1C: No results for input(s): HGBA1C in the last 72 hours. CBG: Recent Labs  Lab 09/28/19 1244 09/28/19 1653 09/28/19 2005 09/29/19 0357 09/29/19 0810  GLUCAP 185* 198* 211* 130* 135*   Lipid Profile: No results for input(s): CHOL, HDL, LDLCALC, TRIG, CHOLHDL, LDLDIRECT in the last 72 hours. Thyroid Function Tests: No results for input(s): TSH, T4TOTAL, FREET4, T3FREE, THYROIDAB in the last 72 hours. Anemia Panel: No results for input(s): VITAMINB12, FOLATE, FERRITIN, TIBC, IRON, RETICCTPCT in the last 72 hours. Sepsis Labs: No results for input(s): PROCALCITON, LATICACIDVEN in the last 168 hours.  Recent Results (from the past  240 hour(s))  Culture, blood (single) w Reflex to ID Panel     Status: None   Collection Time: 09/20/19  6:23 PM   Specimen: BLOOD  Result Value Ref Range Status   Specimen Description   Final    BLOOD RIGHT ANTECUBITAL Performed at [facility] [address], [place], [place] [address]    Special Requests   Final    BOTTLES DRAWN AEROBIC AND ANAEROBIC Blood Culture adequate volume Performed at [facility] [address], [place], [place] [address]    Culture   Final    NO GROWTH 5 DAYS Performed at [facility], [place] [address] [place], [place] [address]    Report Status 09/25/2019 FINAL  Final  SARS CORONAVIRUS 2 (TAT 6-24 HRS) Nasopharyngeal Nasopharyngeal Swab     Status: None   Collection Time: 09/20/19  8:03 PM   Specimen: Nasopharyngeal Swab  Result Value Ref Range Status   SARS Coronavirus 2 NEGATIVE NEGATIVE Final    Comment: (NOTE) SARS-CoV-2 target nucleic acids are NOT DETECTED. The SARS-CoV-2 RNA is generally detectable in upper and lower respiratory specimens during the acute phase of infection. Negative results do not preclude SARS-CoV-2 infection, do not rule out co-infections with other pathogens, and should not be used as the sole basis for treatment or other patient management decisions. Negative results must be combined with clinical observations, patient history, and epidemiological information. The expected result is Negative. Fact Sheet for Patients: SugarRoll.be Fact Sheet for Healthcare Providers: https://www.woods-mathews.com/ This test is not yet approved or cleared by the [place] FDA and  has been authorized for detection and/or diagnosis of SARS-CoV-2 by FDA under an Emergency Use Authorization (EUA). This EUA will remain  in effect (meaning this test can be used) for the duration of the COVID-19 declaration under Section 56 4(b)(1) of the Act, 21  U.S.C. section 360bbb-3(b)(1), unless the authorization is terminated or revoked sooner. Performed at [facility], [place] [address], [place], [place] [address]   Respiratory Panel by PCR     Status: None   Collection Time: 09/20/19  8:03 PM   Specimen: Nasopharyngeal Swab; Respiratory  Result Value Ref Range Status   Adenovirus NOT DETECTED NOT DETECTED Final   Coronavirus 229E NOT DETECTED NOT DETECTED Final    Comment: (NOTE) The Coronavirus on the Respiratory Panel, DOES NOT test for the novel  Coronavirus (2019 nCoV)    Coronavirus HKU1 NOT DETECTED NOT DETECTED Final   Coronavirus NL63 NOT DETECTED NOT DETECTED Final   Coronavirus OC43 NOT DETECTED NOT DETECTED Final   Metapneumovirus NOT DETECTED NOT DETECTED Final   Rhinovirus / Enterovirus NOT DETECTED NOT DETECTED Final   Influenza A NOT DETECTED NOT DETECTED Final   Influenza B NOT DETECTED NOT DETECTED Final   Parainfluenza Virus 1 NOT DETECTED NOT DETECTED Final   Parainfluenza Virus 2 NOT DETECTED NOT DETECTED Final   Parainfluenza Virus 3 NOT DETECTED NOT DETECTED Final   Parainfluenza Virus 4 NOT DETECTED NOT DETECTED Final   Respiratory Syncytial Virus NOT DETECTED NOT DETECTED Final   Bordetella pertussis NOT DETECTED NOT DETECTED Final   Chlamydophila pneumoniae NOT DETECTED NOT DETECTED Final   Mycoplasma pneumoniae NOT DETECTED NOT DETECTED Final    Comment: Performed at [facility], [place]. [address], [place], [place] [address]  Culture, sputum-assessment     Status: None   Collection Time: 09/20/19 11:02 PM   Specimen: Expectorated Sputum  Result Value Ref Range Status   Specimen Description EXPECTORATED SPUTUM  Final   Special Requests NONE  Final   Sputum evaluation   Final    THIS SPECIMEN IS ACCEPTABLE FOR SPUTUM CULTURE Performed at [facility], [place] [address], [place], [place] [address]    Report Status 09/21/2019 FINAL  Final  Culture, respiratory     Status: None    Collection Time: 09/20/19 11:02 PM  Result Value Ref Range Status   Specimen Description   Final    EXPECTORATED SPUTUM Performed at [facility] [address], [place], [place] [address]    Special Requests   Final    NONE Reflexed from [id number] Performed at [facility], [place] [name] [place],  [place] [address]    Gram Stain   Final    FEW WBC PRESENT, PREDOMINANTLY PMN RARE GRAM POSITIVE COCCI    Culture   Final    Consistent with normal respiratory flora. Performed at [facility], [place] [address] [place], [place] [address]    Report Status 09/23/2019 FINAL  Final  MRSA PCR Screening     Status: None   Collection Time: 09/21/19  2:20 AM   Specimen: Nasal Mucosa; Nasopharyngeal  Result Value Ref Range Status   MRSA by PCR NEGATIVE NEGATIVE Final    Comment:        The GeneXpert MRSA Assay (FDA approved for NASAL specimens only), is one component of a comprehensive MRSA colonization surveillance program. It is not intended to diagnose MRSA infection nor to guide or monitor treatment for MRSA infections. Performed at [facility], [place] [address] [place], [place] [address]   Culture, bal-quantitative     Status: None   Collection Time: 09/22/19  3:12 PM   Specimen: Bronchoalveolar Lavage; Respiratory  Result Value Ref Range Status   Specimen Description   Final    BRONCHIAL ALVEOLAR LAVAGE Performed at [facility] [address], [place], [place] [address]    Special Requests   Final    Normal Performed at [facility], [place] [address] [place], [place] [address]    Gram Stain   Final    FEW WBC PRESENT,BOTH PMN AND MONONUCLEAR NO ORGANISMS SEEN    Culture   Final    NO GROWTH 2 DAYS Performed at [facility], [place] [address], [place], [place] [address]    Report Status 09/24/2019 FINAL  Final  Acid Fast Smear (AFB)     Status: None   Collection Time:  09/22/19  3:12 PM   Specimen: Bronchial Alveolar Lavage  Result Value Ref Range Status   AFB Specimen Processing Concentration  Final   Acid Fast Smear Negative  Final    Comment: (NOTE) Performed At: [facility] [address] [place], [place] [id number] Rush Farmer MD OV:[id number]    Source (AFB) BRONCHIAL ALVEOLAR LAVAGE  Final    Comment: Performed at [facility], [place] [address] [place], [place] [address]  Fungus Culture With Stain     Status: None (Preliminary result)   Collection Time: 09/22/19  3:12 PM   Specimen: Bronchial Alveolar Lavage  Result Value Ref Range Status   Fungus Stain Final report  Final    Comment: (NOTE) Performed At: [facility] [place], [place] [id number] Rush Farmer MD [id number]    Fungus (Mycology) Culture PENDING  Incomplete   Fungal Source BRONCHIAL ALVEOLAR LAVAGE  Final    Comment: Performed at [facility], [place] [address] [place], [place] [address]  Novel Coronavirus, [facility] order, Send-out to Ref Lab; TAT 18-24 hrs     Status: None   Collection Time: 09/22/19  3:12 PM  Result Value Ref Range Status   SARS-CoV-2, NAA NOT DETECTED NOT DETECTED Final    Comment: (NOTE) This nucleic acid amplification test was developed and its performance characteristics determined by [employer]. Nucleic acid amplification tests include RT-PCR and TMA. This test has not been FDA cleared or approved. This test has been authorized by FDA under an Emergency Use Authorization (EUA). This test is only authorized for the duration of time the declaration that circumstances exist justifying the authorization of the emergency use  of in vitro diagnostic tests for detection of SARS-CoV-2 virus and/or diagnosis of COVID-19 infection under section 564(b)(1) of the Act, 21 U.S.C. 786VEH-2(C) (1), unless the authorization is terminated or revoked sooner. When diagnostic testing  is negative, the possibility of a false negative result should be considered in the context of a patient's recent exposures and the presence of clinical signs and symptoms consistent with COVID-19. An individual without symptoms of COVID- 19 and who is not shedding SARS-CoV-2  virus would expect to have a negative (not detected) result in this assay. Performed At: [facility] [address] [place], [place] [id number] Rush Farmer MD MO:[id number]    [place]  Final    Comment: Performed at [facility] [address] [place], [place] [address]  Fungus Culture Result     Status: None   Collection Time: 09/22/19  3:12 PM  Result Value Ref Range Status   Result 1 Comment  Final    Comment: (NOTE) KOH/Calcofluor preparation:  no fungus observed. Performed At: [facility] [place], [place] [id number] Rush Farmer MD YF:[id number]   Pneumocystis smear by DFA     Status: None   Collection Time: 09/22/19  3:15 PM   Specimen: Bronchoalveolar Lavage; Respiratory  Result Value Ref Range Status   Specimen Source-PJSRC BRONCHIAL ALVEOLAR LAVAGE  Final   Pneumocystis jiroveci Ag NEGATIVE  Final    Comment: Performed at [place] Performed at [place] [address], [place], [place] [address]   Culture, respiratory (non-expectorated)     Status: None   Collection Time: 09/24/19 11:44 AM   Specimen: Tracheal Aspirate; Respiratory  Result Value Ref Range Status   Specimen Description   Final    TRACHEAL ASPIRATE Performed at [facility] [address] [place], [place] [address]    Special Requests   Final    NONE Performed at [facility], [place] [address], [place], [place] [address]    Gram Stain   Final    ABUNDANT WBC PRESENT, PREDOMINANTLY PMN NO ORGANISMS SEEN Performed at [facility], [place] [address] [place], [place] [address]    Culture FEW CANDIDA ALBICANS  Final   Report Status 09/27/2019 FINAL  Final         Radiology Studies: No results found.      Scheduled Meds: . sodium chloride   Intravenous Once  . allopurinol  100 mg Oral Daily  . amiodarone  200 mg Oral Daily  . amLODipine  5 mg Oral Daily  . atorvastatin  40 mg Oral q1800  . Chlorhexidine Gluconate Cloth  6 each Topical Daily  . feeding supplement (ENSURE ENLIVE)  237 mL Oral BID BM  . feeding supplement (PRO-STAT SUGAR FREE 64)  30 mL Oral BID  . furosemide  40 mg Oral Daily  . heparin injection (subcutaneous)  5,000 Units Subcutaneous Q8H  . hydrocerin   Topical BID  . insulin aspart  0-6 Units Subcutaneous TID WC  . ipratropium-albuterol  3 mL Nebulization TID  . latanoprost  1 drop Both Eyes QHS  . levothyroxine  88 mcg Oral Q0600  . mouth rinse  15 mL Mouth Rinse BID  . multivitamin with minerals  1 tablet Oral Daily  . sennosides  5 mL Oral BID  . sildenafil  20 mg Oral TID  . sodium chloride flush  10-40 mL Intracatheter Q12H  .  sodium chloride flush  3 mL Intravenous Q12H   Continuous Infusions: . sodium chloride Stopped (09/21/19 0756)  . sodium chloride       LOS: 9 days    Time spent: 35 minutes.  Domenic Polite, MD [facility]   09/29/2019, 10:41 AM

## 2019-09-29 NOTE — Progress Notes (Signed)
Physical Therapy Treatment Patient Details Name: Patricia Mata MRN: [id number] DOB: 01/06/1956 Today's Date: 09/29/2019    History of Present Illness This is a 64 yo with history of IDDM, HTN, CKD, Pulm HTN, gout, colon cancer who presents for recurrence of pna like symptoms after treatment for CAP for 5 days on previous admisison from 3/02-3/05.Marland Kitchen required intubation, extubated 09/25/19.    PT Comments    The patient tolerated mobilizing to recliner with increased strength and stability. Patient should progress to Dc home . Continue PT.   Follow Up Recommendations  Home health PT;Supervision/Assistance - 24 hour     Equipment Recommendations  None recommended by PT    Recommendations for Other Services       Precautions / Restrictions Precautions Precaution Comments: watch O2 stats    Mobility  Bed Mobility Overal bed mobility: Needs Assistance       Supine to sit: Supervision        Transfers Overall transfer level: Needs assistance Equipment used: Rolling walker (2 wheeled) Transfers: Sit to/from Stand Sit to Stand: Min assist         General transfer comment: patient  required very little assist to rise from bed  Ambulation/Gait Ambulation/Gait assistance: Min assist Gait Distance (Feet): 5 Feet Assistive device: Rolling walker (2 wheeled) Gait Pattern/deviations: Step-to pattern     General Gait Details: patient steady on legs, no buckling noted.   Stairs             Wheelchair Mobility    Modified Rankin (Stroke Patients Only)       Balance Overall balance assessment: Needs assistance Sitting-balance support: No upper extremity supported;Feet supported Sitting balance-Leahy Scale: Good     Standing balance support: Bilateral upper extremity supported;During functional activity Standing balance-Leahy Scale: Fair                              Cognition Arousal/Alertness: Awake/alert Behavior During Therapy: WFL for tasks  assessed/performed Overall Cognitive Status: No family/caregiver present to determine baseline cognitive functioning                                 General Comments: patient is much more verbal and appropriate, no noted confusion. Very motivated to mobilize.      Exercises      General Comments        Pertinent Vitals/Pain Pain Assessment: No/denies pain    Home Living                      Prior Function            PT Goals (current goals can now be found in the care plan section) Progress towards PT goals: Progressing toward goals    Frequency    Min 3X/week      PT Plan Current plan remains appropriate    Co-evaluation              AM-PAC PT "6 Clicks" Mobility   Outcome Measure  Help needed turning from your back to your side while in a flat bed without using bedrails?: None Help needed moving from lying on your back to sitting on the side of a flat bed without using bedrails?: None Help needed moving to and from a bed to a chair (including a wheelchair)?: A Little Help needed standing up from a chair using  your arms (e.g., wheelchair or bedside chair)?: A Little Help needed to walk in hospital room?: A Lot Help needed climbing 3-5 steps with a railing? : A Lot 6 Click Score: 18    End of Session Equipment Utilized During Treatment: Oxygen Activity Tolerance: Patient tolerated treatment well Patient left: in chair;with call bell/phone within reach;with chair alarm set Nurse Communication: Mobility status PT Visit Diagnosis: Unsteadiness on feet (R26.81);Muscle weakness (generalized) (M62.81);Difficulty in walking, not elsewhere classified (R26.2)     Time: 2217-9810 PT Time Calculation (min) (ACUTE ONLY): 35 min  Charges:  $Therapeutic Activity: 23-37 mins                     Patricia Mata PT Acute Rehabilitation Services Pager [phone] Office [phone]    Claretha Cooper 09/29/2019, 12:30 PM

## 2019-09-29 NOTE — Telephone Encounter (Signed)
Readjusted lab due to them needing time to mix the blood, per infusion nurse.  Spoke with the pt and she is aware of the change.

## 2019-09-30 ENCOUNTER — Ambulatory Visit (HOSPITAL_COMMUNITY): Payer: PPO

## 2019-09-30 ENCOUNTER — Inpatient Hospital Stay (HOSPITAL_COMMUNITY): Payer: PPO

## 2019-09-30 LAB — BASIC METABOLIC PANEL
Anion gap: 7 (ref 5–15)
BUN: 61 mg/dL — ABNORMAL HIGH (ref 8–23)
CO2: 23 mmol/L (ref 22–32)
Calcium: 8.9 mg/dL (ref 8.9–10.3)
Chloride: 110 mmol/L (ref 98–111)
Creatinine, Ser: 2.21 mg/dL — ABNORMAL HIGH (ref 0.44–1.00)
GFR calc Af Amer: 26 mL/min — ABNORMAL LOW (ref >60)
GFR calc non Af Amer: 23 mL/min — ABNORMAL LOW (ref >60)
Glucose, Bld: 127 mg/dL — ABNORMAL HIGH (ref 70–99)
Potassium: 4.3 mmol/L (ref 3.5–5.1)
Sodium: 140 mmol/L (ref 135–145)

## 2019-09-30 LAB — CBC
HCT: 25.8 % — ABNORMAL LOW (ref 36.0–46.0)
Hemoglobin: 8.3 g/dL — ABNORMAL LOW (ref 12.0–15.0)
MCH: 29.4 pg (ref 26.0–34.0)
MCHC: 32.2 g/dL (ref 30.0–36.0)
MCV: 91.5 fL (ref 80.0–100.0)
Platelets: 219 10*3/uL (ref 150–400)
RBC: 2.82 MIL/uL — ABNORMAL LOW (ref 3.87–5.11)
RDW: 20.1 % — ABNORMAL HIGH (ref 11.5–15.5)
WBC: 12.8 10*3/uL — ABNORMAL HIGH (ref 4.0–10.5)
nRBC: 1.6 % — ABNORMAL HIGH (ref 0.0–0.2)

## 2019-09-30 LAB — BPAM RBC
Blood Product Expiration Date: 202104082359
ISSUE DATE / TIME: 202103171122
Unit Type and Rh: 600

## 2019-09-30 LAB — GLUCOSE, CAPILLARY
Glucose-Capillary: 120 mg/dL — ABNORMAL HIGH (ref 70–99)
Glucose-Capillary: 227 mg/dL — ABNORMAL HIGH (ref 70–99)
Glucose-Capillary: 248 mg/dL — ABNORMAL HIGH (ref 70–99)
Glucose-Capillary: 136 mg/dL — ABNORMAL HIGH (ref 70–99)

## 2019-09-30 LAB — TYPE AND SCREEN
ABO/RH(D): A NEG
Antibody Screen: NEGATIVE
Unit division: 0

## 2019-09-30 MED ORDER — IPRATROPIUM-ALBUTEROL 0.5-2.5 (3) MG/3ML IN SOLN: 3.0000 mL | RESPIRATORY_TRACT | Status: DC | PRN

## 2019-09-30 MED ORDER — FUROSEMIDE 10 MG/ML IJ SOLN
60.0000 mg | Freq: Two times a day (BID) | INTRAMUSCULAR | Status: DC
  Administered 2019-09-30 – 2019-10-02 (×4): 60 mg via INTRAVENOUS
  Filled 2019-09-30 (×4): qty 6

## 2019-09-30 MED ORDER — IPRATROPIUM-ALBUTEROL 0.5-2.5 (3) MG/3ML IN SOLN: 3.0000 mL | RESPIRATORY_TRACT | Status: DC

## 2019-09-30 MED ORDER — FUROSEMIDE 10 MG/ML IJ SOLN
60.0000 mg | Freq: Once | INTRAMUSCULAR | Status: AC
  Administered 2019-09-30: 60 mg via INTRAVENOUS
  Filled 2019-09-30: qty 6

## 2019-09-30 NOTE — Addendum Note (Signed)
Encounter addended by: Lance Morin, RN on: 09/30/2019 3:35 PM  Actions taken: Clinical Note Signed, Episode resolved

## 2019-09-30 NOTE — Progress Notes (Signed)
Discharge Progress Report  Patient Details  Name: Patricia Mata MRN: [id number] Date of Birth: 08/19/1955 Referring Provider:     Pulmonary Rehab Walk Test from 06/08/2019 in [facility]  Referring Provider  Dr. Haroldine Mata        Number of Visits: 5 Reason for Discharge:  Early Exit:  hospitalization requiring intubation  Smoking History:  Social History   Tobacco Use  Smoking Status Former Smoker  . Packs/day: 0.25  . Years: 20.00  . Pack years: 5.00  . Types: Cigarettes  . Quit date: 01/31/2001  . Years since quitting: 18.6  Smokeless Tobacco Former User    Diagnosis:  Pulmonary hypertension ([place])  ADL [facility]SD: Pulmonary Assessment Scores    Row Name 06/08/19 1515 06/08/19 1551       ADL [facility]   ADL Phase  Entry  Entry    SOB Score total  12  --      CAT Score   CAT Score  10  --      mMRC Score   mMRC Score  --  3       Initial Exercise Prescription: Initial Exercise Prescription - 06/08/19 1500      Date of Initial Exercise RX and Referring Provider   Date  06/08/19    Referring Provider  Dr. Haroldine Mata       NuStep   Level  2    SPM  80    Minutes  30      Prescription Details   Frequency (times per week)  2    Duration  Progress to 30 minutes of continuous aerobic without signs/symptoms of physical distress      Intensity   THRR 40-80% of Max Heartrate  62-125    Ratings of Perceived Exertion  11-13    Perceived Dyspnea  0-4      Progression   Progression  Continue progressive overload as per policy without signs/symptoms or physical distress.      Resistance Training   Training Prescription  Yes    Weight  orange bands    Reps  10-15       Discharge Exercise Prescription (Final Exercise Prescription Changes): Exercise Prescription Changes - 07/06/19 1447      Response to Exercise   Blood Pressure (Admit)  140/70    Blood Pressure (Exercise)  150/54    Blood Pressure (Exit)  120/62    Heart Rate (Admit)   60 bpm    Heart Rate (Exercise)  71 bpm    Heart Rate (Exit)  62 bpm    Oxygen Saturation (Admit)  100 %    Oxygen Saturation (Exercise)  100 %    Oxygen Saturation (Exit)  100 %    Rating of Perceived Exertion (Exercise)  13    Perceived Dyspnea (Exercise)  0    Duration  Continue with 30 min of aerobic exercise without signs/symptoms of physical distress.    Intensity  THRR unchanged      Resistance Training   Training Prescription  Yes    Weight  orange bands    Reps  10-15    Time  10 Minutes      NuStep   Level  2    SPM  80    Minutes  30    METs  1.8       Functional Capacity: 6 Minute Walk    Row Name 06/08/19 1552         6  Minute Walk   Phase  Initial     Distance  500 feet     Walk Time  4.08 minutes     # of Rest Breaks  3     MPH  0.95     METS  1.98     RPE  13     Perceived Dyspnea   0     VO2 Peak  6.93     Symptoms  Yes (comment)     Comments  3 seated rest breaks 30 sec, 30 sec, 55 sec all due to leg fatigue     Resting HR  71 bpm     Resting BP  144/56     Resting Oxygen Saturation   100 %     Exercise Oxygen Saturation  during 6 min walk  99 %     Max Ex. HR  100 bpm     Max Ex. BP  184/60     2 Minute Post BP  156/48       Interval HR   1 Minute HR  94     2 Minute HR  96     3 Minute HR  95     4 Minute HR  91     5 Minute HR  100     6 Minute HR  100     2 Minute Post HR  79     Interval Heart Rate?  Yes       Interval Oxygen   Interval Oxygen?  Yes     Baseline Oxygen Saturation %  100 %     1 Minute Oxygen Saturation %  99 %     1 Minute Liters of Oxygen  0 L     2 Minute Oxygen Saturation %  100 %     2 Minute Liters of Oxygen  0 L     3 Minute Oxygen Saturation %  99 %     3 Minute Liters of Oxygen  0 L     4 Minute Oxygen Saturation %  99 %     4 Minute Liters of Oxygen  0 L     5 Minute Oxygen Saturation %  99 %     5 Minute Liters of Oxygen  0 L     6 Minute Oxygen Saturation %  99 %     6 Minute Liters of  Oxygen  0 L     2 Minute Post Oxygen Saturation %  100 %     2 Minute Post Liters of Oxygen  0 L        Psychological, QOL, Others - Outcomes: PHQ 2/9: Depression screen [facility] 2/9 06/22/2019 06/08/2019 12/09/2018 09/09/2018  Decreased Interest 0 0 0 0  Down, Depressed, Hopeless 0 0 0 0  PHQ - 2 Score 0 0 0 0  Altered sleeping - 0 - -  Tired, decreased energy - 0 - -  Change in appetite - 0 - -  Feeling bad or failure about yourself  - 0 - -  Trouble concentrating - 0 - -  Moving slowly or fidgety/restless - 0 - -  Suicidal thoughts - 0 - -  PHQ-9 Score - 0 - -  Difficult doing work/chores - Not difficult at all - -  Some recent data might be hidden    Quality of Life:   Personal Goals: Goals established at orientation with interventions provided to work toward goal. Personal Goals  and Risk Factors at Admission - 06/28/19 1213      Core Components/Risk Factors/Patient Goals on Admission   Improve shortness of breath with ADL's  Yes    Intervention  Provide education, individualized exercise plan and daily activity instruction to help decrease symptoms of SOB with activities of daily living.    Expected Outcomes  Short Term: Improve cardiorespiratory fitness to achieve a reduction of symptoms when performing ADLs;Long Term: Be able to perform more ADLs without symptoms or delay the onset of symptoms        Personal Goals Discharge: Goals and Risk Factor Review    Row Name 06/28/19 1215 08/30/19 1406           Core Components/Risk Factors/Patient Goals Review   Personal Goals Review  Develop more efficient breathing techniques such as purse lipped breathing and diaphragmatic breathing and practicing self-pacing with activity.;Increase knowledge of respiratory medications and ability to use respiratory devices properly.;Improve shortness of breath with ADL's  Develop more efficient breathing techniques such as purse lipped breathing and diaphragmatic breathing and practicing  self-pacing with activity.;Increase knowledge of respiratory medications and ability to use respiratory devices properly.;Improve shortness of breath with ADL's      Review  Patricia Mata just started the pulmonary rehab program, has attended 2 exercise sessions, too early to have met any program goals.  Department has been closed x 7 weeks, we will reopen 09/07/2019 and will continue working on admission goals.      Expected Outcomes  See admission goals.  See admission goals.         Exercise Goals and Review: Exercise Goals    Row Name 06/08/19 1558 06/28/19 1018 08/31/19 0906         Exercise Goals   Increase Physical Activity  Yes  Yes  Yes     Intervention  Provide advice, education, support and counseling about physical activity/exercise needs.;Develop an individualized exercise prescription for aerobic and resistive training based on initial evaluation findings, risk stratification, comorbidities and participant's personal goals.  Provide advice, education, support and counseling about physical activity/exercise needs.;Develop an individualized exercise prescription for aerobic and resistive training based on initial evaluation findings, risk stratification, comorbidities and participant's personal goals.  Provide advice, education, support and counseling about physical activity/exercise needs.;Develop an individualized exercise prescription for aerobic and resistive training based on initial evaluation findings, risk stratification, comorbidities and participant's personal goals.     Expected Outcomes  Short Term: Attend rehab on a regular basis to increase amount of physical activity.;Long Term: Add in home exercise to make exercise part of routine and to increase amount of physical activity.;Long Term: Exercising regularly at least 3-5 days a week.  Short Term: Attend rehab on a regular basis to increase amount of physical activity.;Long Term: Add in home exercise to make exercise part of routine  and to increase amount of physical activity.;Long Term: Exercising regularly at least 3-5 days a week.  Short Term: Attend rehab on a regular basis to increase amount of physical activity.;Long Term: Add in home exercise to make exercise part of routine and to increase amount of physical activity.;Long Term: Exercising regularly at least 3-5 days a week.     Increase Strength and Stamina  Yes  Yes  Yes     Intervention  Provide advice, education, support and counseling about physical activity/exercise needs.;Develop an individualized exercise prescription for aerobic and resistive training based on initial evaluation findings, risk stratification, comorbidities and participant's personal goals.  Provide advice, education,  support and counseling about physical activity/exercise needs.;Develop an individualized exercise prescription for aerobic and resistive training based on initial evaluation findings, risk stratification, comorbidities and participant's personal goals.  Provide advice, education, support and counseling about physical activity/exercise needs.;Develop an individualized exercise prescription for aerobic and resistive training based on initial evaluation findings, risk stratification, comorbidities and participant's personal goals.     Expected Outcomes  Short Term: Increase workloads from initial exercise prescription for resistance, speed, and METs.;Short Term: Perform resistance training exercises routinely during rehab and add in resistance training at home;Long Term: Improve cardiorespiratory fitness, muscular endurance and strength as measured by increased METs and functional capacity (6MWT)  Short Term: Increase workloads from initial exercise prescription for resistance, speed, and METs.;Short Term: Perform resistance training exercises routinely during rehab and add in resistance training at home;Long Term: Improve cardiorespiratory fitness, muscular endurance and strength as measured by  increased METs and functional capacity (6MWT)  Short Term: Increase workloads from initial exercise prescription for resistance, speed, and METs.;Short Term: Perform resistance training exercises routinely during rehab and add in resistance training at home;Long Term: Improve cardiorespiratory fitness, muscular endurance and strength as measured by increased METs and functional capacity (6MWT)     Able to understand and use rate of perceived exertion (RPE) scale  Yes  Yes  Yes     Intervention  Provide education and explanation on how to use RPE scale  Provide education and explanation on how to use RPE scale  Provide education and explanation on how to use RPE scale     Expected Outcomes  Short Term: Able to use RPE daily in rehab to express subjective intensity level;Long Term:  Able to use RPE to guide intensity level when exercising independently  Short Term: Able to use RPE daily in rehab to express subjective intensity level;Long Term:  Able to use RPE to guide intensity level when exercising independently  Short Term: Able to use RPE daily in rehab to express subjective intensity level;Long Term:  Able to use RPE to guide intensity level when exercising independently     Able to understand and use Dyspnea scale  Yes  Yes  Yes     Intervention  Provide education and explanation on how to use Dyspnea scale  Provide education and explanation on how to use Dyspnea scale  Provide education and explanation on how to use Dyspnea scale     Expected Outcomes  Short Term: Able to use Dyspnea scale daily in rehab to express subjective sense of shortness of breath during exertion;Long Term: Able to use Dyspnea scale to guide intensity level when exercising independently  Short Term: Able to use Dyspnea scale daily in rehab to express subjective sense of shortness of breath during exertion;Long Term: Able to use Dyspnea scale to guide intensity level when exercising independently  Short Term: Able to use Dyspnea  scale daily in rehab to express subjective sense of shortness of breath during exertion;Long Term: Able to use Dyspnea scale to guide intensity level when exercising independently     Knowledge and understanding of Target Heart Rate Range (THRR)  Yes  Yes  Yes     Intervention  --  Provide education and explanation of THRR including how the numbers were predicted and where they are located for reference  Provide education and explanation of THRR including how the numbers were predicted and where they are located for reference     Expected Outcomes  Short Term: Able to state/look up THRR;Short Term: Able  to use daily as guideline for intensity in rehab;Long Term: Able to use THRR to govern intensity when exercising independently  Short Term: Able to state/look up THRR;Short Term: Able to use daily as guideline for intensity in rehab;Long Term: Able to use THRR to govern intensity when exercising independently  Short Term: Able to state/look up THRR;Short Term: Able to use daily as guideline for intensity in rehab;Long Term: Able to use THRR to govern intensity when exercising independently     Understanding of Exercise Prescription  Yes  Yes  Yes     Intervention  Provide education, explanation, and written materials on patient's individual exercise prescription  Provide education, explanation, and written materials on patient's individual exercise prescription  Provide education, explanation, and written materials on patient's individual exercise prescription     Expected Outcomes  Short Term: Able to explain program exercise prescription;Long Term: Able to explain home exercise prescription to exercise independently  Short Term: Able to explain program exercise prescription;Long Term: Able to explain home exercise prescription to exercise independently  Short Term: Able to explain program exercise prescription;Long Term: Able to explain home exercise prescription to exercise independently        Exercise  Goals Re-Evaluation: Exercise Goals Re-Evaluation    Row Name 06/28/19 1019 08/31/19 0907           Exercise Goal Re-Evaluation   Exercise Goals Review  Increase Physical Activity;Increase Strength and Stamina;Able to understand and use rate of perceived exertion (RPE) scale;Able to understand and use Dyspnea scale;Knowledge and understanding of Target Heart Rate Range (THRR);Understanding of Exercise Prescription  Increase Physical Activity;Increase Strength and Stamina;Able to understand and use rate of perceived exertion (RPE) scale;Able to understand and use Dyspnea scale;Knowledge and understanding of Target Heart Rate Range (THRR);Understanding of Exercise Prescription      Comments  Pt has completed 2 exercise sessions. I will be working with pt to build up her leg strength and muscle stamina. Pt is currently limited by both. Pt shows a positive outlook and is eager to work hard. Pt currently exercises at 1.8 METs on the stepper. Will continue to monitor and progress as able.  It is unknown if pt has exercised since we ceased in person exercise, as she did not wish to be called. Pt will return 2/23. Will monitor and progress as able.      Expected Outcomes  Through exercise at rehab and at home, the patient will decrease shortness of breath with daily activities and feel confident in carrying out an exercise regime at home.  Through exercise at rehab and at home, the patient will decrease shortness of breath with daily activities and feel confident in carrying out an exercise regime at home.         Nutrition & Weight - Outcomes: Pre Biometrics - 06/08/19 1505      Pre Biometrics   Height  '5\' 4"'  (1.626 m)    Weight  82.2 kg    BMI (Calculated)  31.09    Grip Strength  21 kg        Nutrition: Nutrition Therapy & Goals - 06/29/19 1348      Nutrition Therapy   Diet  Therapeutic Lifestyle Changes    Drug/Food Interactions  Statins/Certain Fruits      Personal Nutrition Goals    Nutrition Goal  Pt to build a healthy plate including vegetables, fruits, whole grains, and low-fat dairy products in a heart healthy meal plan.    Personal Goal #2  CBG concentrations in the normal range or as close to normal as is safely possible.      Intervention Plan   Intervention  Prescribe, educate and counsel regarding individualized specific dietary modifications aiming towards targeted core components such as weight, hypertension, lipid management, diabetes, heart failure and other comorbidities.;Nutrition handout(s) given to patient.    Expected Outcomes  Short Term Goal: Understand basic principles of dietary content, such as calories, fat, sodium, cholesterol and nutrients.;Long Term Goal: Adherence to prescribed nutrition plan.       Nutrition Discharge: Nutrition Assessments - 06/29/19 1406      Rate Your Plate Scores   Pre Score  47       Education Questionnaire Score: Knowledge Questionnaire Score - 06/08/19 1519      Knowledge Questionnaire Score   Pre Score  9/18       Goals reviewed with patient; copy given to patient.

## 2019-09-30 NOTE — TOC Progression Note (Addendum)
Transition of Care [facility]) - Progression Note    Patient Details  Name: Patricia Mata MRN: [id number] Date of Birth: 1956/01/15  [facility] CM/SW Contact  Ross Ludwig, [place] Phone Number: 09/30/2019, 3:30 PM  Clinical Narrative:     CSW looking for home health agency who can accept [employer].  CSW to continue to follow patient's progress throughout discharge planning.    Expected Discharge Plan and Services           Expected Discharge Date: (unknown)                                     Social Determinants of Health (SDOH) Interventions    Readmission Risk Interventions No flowsheet data found.

## 2019-09-30 NOTE — Progress Notes (Signed)
Physical Therapy Treatment Patient Details Name: Patricia Mata MRN: [id number] DOB: October 18, 1955 Today's Date: 09/30/2019    History of Present Illness This is a 64 yo with history of IDDM, HTN, CKD, Pulm HTN, gout, colon cancer who presents for recurrence of pna like symptoms after treatment for CAP for 5 days on previous admisison from 3/02-3/05.Marland Kitchen required intubation, extubated 09/25/19.    PT Comments    Pt assisted with ambulating short distance in hallway. Pt continues to require supplemental oxygen.   Follow Up Recommendations  Home health PT;Supervision/Assistance - 24 hour     Equipment Recommendations  None recommended by PT    Recommendations for Other Services       Precautions / Restrictions Precautions Precautions: Fall Precaution Comments: watch O2 stats    Mobility  Bed Mobility Overal bed mobility: Needs Assistance Bed Mobility: Supine to Sit     Supine to sit: Supervision     General bed mobility comments: SpO2 83% on room air so 2L O2 [place] reapplied for mobility  Transfers Overall transfer level: Needs assistance Equipment used: Rolling walker (2 wheeled) Transfers: Sit to/from Stand Sit to Stand: Min assist         General transfer comment: slight assist to rise from bed, cues for hand placement  Ambulation/Gait Ambulation/Gait assistance: Min guard Gait Distance (Feet): 120 Feet Assistive device: Rolling walker (2 wheeled) Gait Pattern/deviations: Step-through pattern;Decreased stride length     General Gait Details: verbal cues for use of RW, Spo2 89% on 2L O2 [place] and 95% on 3L O2 [place] with ambulating; distance to tolerance   Stairs             Wheelchair Mobility    Modified Rankin (Stroke Patients Only)       Balance                                            Cognition Arousal/Alertness: Awake/alert Behavior During Therapy: WFL for tasks assessed/performed                                    General Comments: patient appropriate, no noted confusion. Very motivated to mobilize.      Exercises      General Comments        Pertinent Vitals/Pain Pain Assessment: No/denies pain    Home Living                      Prior Function            PT Goals (current goals can now be found in the care plan section) Progress towards PT goals: Progressing toward goals    Frequency    Min 3X/week      PT Plan Current plan remains appropriate    Co-evaluation              AM-PAC PT "6 Clicks" Mobility   Outcome Measure  Help needed turning from your back to your side while in a flat bed without using bedrails?: None Help needed moving from lying on your back to sitting on the side of a flat bed without using bedrails?: None Help needed moving to and from a bed to a chair (including a wheelchair)?: A Little Help needed standing up from a chair using your arms (  e.g., wheelchair or bedside chair)?: A Little Help needed to walk in hospital room?: A Little Help needed climbing 3-5 steps with a railing? : A Little 6 Click Score: 20    End of Session Equipment Utilized During Treatment: Gait belt;Oxygen Activity Tolerance: Patient tolerated treatment well Patient left: in bed;with call bell/phone within reach;with family/visitor present Nurse Communication: Mobility status PT Visit Diagnosis: Unsteadiness on feet (R26.81);Muscle weakness (generalized) (M62.81);Difficulty in walking, not elsewhere classified (R26.2)     Time: 8718-3672 PT Time Calculation (min) (ACUTE ONLY): 14 min  Charges:  $Gait Training: 8-22 mins                     Arlyce Dice, DPT Acute Rehabilitation Services Office: [phone]  Trena Platt 09/30/2019, 3:41 PM

## 2019-09-30 NOTE — Progress Notes (Signed)
PROGRESS NOTE    Patricia Mata  KDT:[id number] DOB: [birth year] DOA: 09/20/2019 PCP: [name], FNP   Brief Narrative: 64 year old with past medical history significant for chronic diastolic CHF, pulmonary hypertension, severe aortic stenosis, moderate MR, paroxysmal A. fib, myelodysplastic syndrome, CKD4, chronic anemia, colon cancer who presented with recurrence of pneumonia-like symptoms after treated for CAP for 5 days on previous admission from 3/2 until 3/5. Patient admitted with acute hypoxic respiratory failure secondary to pneumonia.  Patient requires significant oxygen supplementation, subsequently she was intubated on 3/9 and extubated 3-13.   -Followed by cardiology this admission, treated with diuretics  Assessment & Plan:   Acute hypoxic respiratory failure requiring mechanical ventilation this admission from 3/9 on ~3/13.  ARDS.  - Treated for Covid pneumonia on 03/18/2019. -Completed antibiotics for healthcare associated pneumonia, cefepime -Improving slowly -Wean O2 as tolerated  -Pulmonary hypertension,  -continue with sildenafil -Diuretics, see please see below  Acute on chronic diastolic CHF Severe aortic stenosis:  Moderate mitral regurgitation Paroxysmal atrial fibrillation -not a candidate for TAVR per cardiology note on 09/26/2019. -Appears slightly more volume overloaded today, 60 mg IV Lasix x1 this afternoon, already got her oral dose this morning -Not on anticoagulation per cardiology given recurrent transfusion dependent anemia -Continue oral amiodarone  Myelodysplasia Chronic anemia -Transfusion dependent, followed at the cancer center by Dr. Alen Blew -Hemoglobin has trended down to 7.5 yesterday and was transfused a unit of PRBC, hemoglobin 8.3 this morning -Recent anemia panel suggestive of chronic disease  DM, with hypoglycemia;  -CBG stable now, continue sliding scale insulin, Lantus  CKD stage  IIIb -Baseline creatinine around 1.8, peaked to 2.5  this admission  -Now improving, creatinine is 2.1 today  -Resume p.o. Lasix   Hypertension: Continue with amlodipine Avoid  vasodilators due to severe aortic stenosis  Left adrenal nodule: Need to follow-up  DVT prophylaxis: Heparin Code Status: Full code Family Communication: No family at bedside, called and updated patient's son from her room Disposition Plan: Home with home health services in 1 to 2 days pending improvement in volume status, respiratory failure   Consultants:   PCCM  Cardiology  Procedures:   Mechanical intubation from 3/9 until 3/13  Antimicrobials:  Completed course of cefepime for 7 days  Subjective: -Feels okay, continues to have some dyspnea with activity, overall little better  Objective: Vitals:   09/30/19 0113 09/30/19 0607 09/30/19 0805 09/30/19 1216  BP: (!) 151/49 (!) 152/62 (!) 168/58 (!) 169/52  Pulse: 63 64 68 62  Resp: _0 Temp: 97.8 F (36.6 C) 97.6 F (36.4 C) 98.3 F (36.8 C) 98.9 F (37.2 C)  TempSrc: Oral Oral Oral Oral  SpO2: 94% 96% 99% 91%  Weight: 83.4 kg     Height:        Intake/Output Summary (Last 24 hours) at 09/30/2019 1437 Last data filed at 09/30/2019 1100 Gross per 24 hour  Intake 661 ml  Output -  Net 661 ml   Filed Weights   09/28/19 0425 09/29/19 0500 09/30/19 0113  Weight: 85 kg 86 kg 83.4 kg    Examination: Gen: Average built elderly female, sitting up in bed, AAOx3, no distress HEENT: PERRLA, Neck supple, no JVD Lungs: Extensive basilar crackles CVS: S1-S2, regular rate rhythm, systolic ejection murmur Abd: soft, Non tender, non distended, BS present Extremities: No edema Skin: no new rashes on exposed skin    Data Reviewed: I have personally reviewed following labs and imaging studies  CBC: Recent Labs  Lab 09/24/19 0610 09/24/19 0610 09/25/19 0448 09/26/19 0343 09/27/19 0557 09/28/19 0424 09/30/19 0427  WBC 11.6*   < > 19.0* 20.6* 13.2* 10.6* 12.8*  NEUTROABS 8.8*   --  14.0* 15.7*  --   --   --   HGB 6.9*   < > 7.3* 7.9* 8.0* 7.5* 8.3*  HCT 21.6*   < > 22.8* 25.0* 24.8* 24.2* 25.8*  MCV 92.3   < > 92.3 92.3 94.3 93.8 91.5  PLT 213   < > 240 263 256 257 219   < > = values in this interval not displayed.   Basic Metabolic Panel: Recent Labs  Lab 09/24/19 0610 09/24/19 0610 09/25/19 0448 09/26/19 0343 09/27/19 0557 09/28/19 0424 09/30/19 0427  NA 140   < > 139 145 145 143 140  K 3.9   < > 4.2 4.2 4.4 3.9 4.3  CL 110   < > 110 111 110 111 110  CO2 20*   < > 20* _0 GLUCOSE 131*   < > 214* 97 105* 73 127*  BUN 68*   < > 87* 74* 68* 63* 61*  CREATININE 2.45*   < > 2.57* 2.37* 2.15* 2.14* 2.21*  CALCIUM 8.6*   < > 8.7* 9.1 9.2 9.2 8.9  MG 1.8  --  1.8  --   --  1.6*  --   PHOS 2.6  --  2.7  --   --  3.3  --    < > = values in this interval not displayed.   GFR: Estimated Creatinine Clearance: 26.9 mL/min (A) (by C-G formula based on SCr of 2.21 mg/dL (H)). Liver Function Tests: Recent Labs  Lab 09/24/19 0610  AST 18  ALT 9  ALKPHOS 106  BILITOT 1.0  PROT 5.7*  ALBUMIN 2.1*   No results for input(s): LIPASE, AMYLASE in the last 168 hours. No results for input(s): AMMONIA in the last 168 hours. Coagulation Profile: No results for input(s): INR, PROTIME in the last 168 hours. Cardiac Enzymes: No results for input(s): CKTOTAL, CKMB, CKMBINDEX, TROPONINI in the last 168 hours. BNP (last 3 results) No results for input(s): PROBNP in the last 8760 hours. HbA1C: No results for input(s): HGBA1C in the last 72 hours. CBG: Recent Labs  Lab 09/29/19 1205 09/29/19 1643 09/29/19 2125 09/30/19 0801 09/30/19 1212  GLUCAP 270* 160* 157* 120* 227*   Lipid Profile: No results for input(s): CHOL, HDL, LDLCALC, TRIG, CHOLHDL, LDLDIRECT in the last 72 hours. Thyroid Function Tests: No results for input(s): TSH, T4TOTAL, FREET4, T3FREE, THYROIDAB in the last 72 hours. Anemia Panel: No results for input(s): VITAMINB12, FOLATE,  FERRITIN, TIBC, IRON, RETICCTPCT in the last 72 hours. Sepsis Labs: No results for input(s): PROCALCITON, LATICACIDVEN in the last 168 hours.  Recent Results (from the past 240 hour(s))  Culture, blood (single) w Reflex to ID Panel     Status: None   Collection Time: 09/20/19  6:23 PM   Specimen: BLOOD  Result Value Ref Range Status   Specimen Description   Final    BLOOD RIGHT ANTECUBITAL Performed at [facility] [address], [place], [place] [address]    Special Requests   Final    BOTTLES DRAWN AEROBIC AND ANAEROBIC Blood Culture adequate volume Performed at [facility] [address] [place], [place] [address]    Culture   Final    NO GROWTH 5 DAYS Performed at [facility], [address] [place], [place] [address]    Report Status 09/25/2019 FINAL  Final  SARS CORONAVIRUS 2 (TAT 6-24 HRS) Nasopharyngeal Nasopharyngeal Swab     Status: None   Collection Time: 09/20/19  8:03 PM   Specimen: Nasopharyngeal Swab  Result Value Ref Range Status   SARS Coronavirus 2 NEGATIVE NEGATIVE Final    Comment: (NOTE) SARS-CoV-2 target nucleic acids are NOT DETECTED. The SARS-CoV-2 RNA is generally detectable in upper and lower respiratory specimens during the acute phase of infection. Negative results do not preclude SARS-CoV-2 infection, do not rule out co-infections with other pathogens, and should not be used as the sole basis for treatment or other patient management decisions. Negative results must be combined with clinical observations, patient history, and epidemiological information. The expected result is Negative. Fact Sheet for Patients: SugarRoll.be Fact Sheet for Healthcare Providers: https://www.woods-mathews.com/ This test is not yet approved or cleared by the [place] FDA and  has been authorized for detection and/or diagnosis of SARS-CoV-2 by FDA under an Emergency Use  Authorization (EUA). This EUA will remain  in effect (meaning this test can be used) for the duration of the COVID-19 declaration under Section 56 4(b)(1) of the Act, 21 U.S.C. section 360bbb-3(b)(1), unless the authorization is terminated or revoked sooner. Performed at [facility], [place] [address], [place], [place] [address]   Respiratory Panel by PCR     Status: None   Collection Time: 09/20/19  8:03 PM   Specimen: Nasopharyngeal Swab; Respiratory  Result Value Ref Range Status   Adenovirus NOT DETECTED NOT DETECTED Final   Coronavirus 229E NOT DETECTED NOT DETECTED Final    Comment: (NOTE) The Coronavirus on the Respiratory Panel, DOES NOT test for the novel  Coronavirus (2019 nCoV)    Coronavirus HKU1 NOT DETECTED NOT DETECTED Final   Coronavirus NL63 NOT DETECTED NOT DETECTED Final   Coronavirus OC43 NOT DETECTED NOT DETECTED Final   Metapneumovirus NOT DETECTED NOT DETECTED Final   Rhinovirus / Enterovirus NOT DETECTED NOT DETECTED Final   Influenza A NOT DETECTED NOT DETECTED Final   Influenza B NOT DETECTED NOT DETECTED Final   Parainfluenza Virus 1 NOT DETECTED NOT DETECTED Final   Parainfluenza Virus 2 NOT DETECTED NOT DETECTED Final   Parainfluenza Virus 3 NOT DETECTED NOT DETECTED Final   Parainfluenza Virus 4 NOT DETECTED NOT DETECTED Final   Respiratory Syncytial Virus NOT DETECTED NOT DETECTED Final   Bordetella pertussis NOT DETECTED NOT DETECTED Final   Chlamydophila pneumoniae NOT DETECTED NOT DETECTED Final   Mycoplasma pneumoniae NOT DETECTED NOT DETECTED Final    Comment: Performed at [facility], [place]. [address] [place], [place] [address]  Culture, sputum-assessment     Status: None   Collection Time: 09/20/19 11:02 PM   Specimen: Expectorated Sputum  Result Value Ref Range Status   Specimen Description EXPECTORATED SPUTUM  Final   Special Requests NONE  Final   Sputum evaluation   Final    THIS SPECIMEN IS ACCEPTABLE FOR SPUTUM CULTURE  Performed at [facility], [place] [address], [place], [place] [address]    Report Status 09/21/2019 FINAL  Final  Culture, respiratory     Status: None   Collection Time: 09/20/19 11:02 PM  Result Value Ref Range Status   Specimen Description   Final    EXPECTORATED SPUTUM Performed at [facility] [address], [place], [place] [address]    Special Requests   Final  NONE Reflexed from [id number] Performed at [facility], [place] [address], [place], [place] [address]    Gram Stain   Final    FEW WBC PRESENT, PREDOMINANTLY PMN RARE GRAM POSITIVE COCCI    Culture   Final    Consistent with normal respiratory flora. Performed at [facility], [place] [address], [place], [place] [address]    Report Status 09/23/2019 FINAL  Final  MRSA PCR Screening     Status: None   Collection Time: 09/21/19  2:20 AM   Specimen: Nasal Mucosa; Nasopharyngeal  Result Value Ref Range Status   MRSA by PCR NEGATIVE NEGATIVE Final    Comment:        The GeneXpert MRSA Assay (FDA approved for NASAL specimens only), is one component of a comprehensive MRSA colonization surveillance program. It is not intended to diagnose MRSA infection nor to guide or monitor treatment for MRSA infections. Performed at [facility], [place] [address], [place], [place] [address]   Culture, bal-quantitative     Status: None   Collection Time: 09/22/19  3:12 PM   Specimen: Bronchoalveolar Lavage; Respiratory  Result Value Ref Range Status   Specimen Description   Final    BRONCHIAL ALVEOLAR LAVAGE Performed at [facility] [address], [place], [place] [address]    Special Requests   Final    Normal Performed at [facility], [place] [address] [place], [place] [address]    Gram Stain   Final    FEW WBC PRESENT,BOTH PMN AND MONONUCLEAR NO ORGANISMS SEEN    Culture   Final    NO GROWTH 2 DAYS  Performed at [facility], [place] [address], [place], [place] [address]    Report Status 09/24/2019 FINAL  Final  Acid Fast Smear (AFB)     Status: None   Collection Time: 09/22/19  3:12 PM   Specimen: Bronchial Alveolar Lavage  Result Value Ref Range Status   AFB Specimen Processing Concentration  Final   Acid Fast Smear Negative  Final    Comment: (NOTE) Performed At: [facility] [address] [place], [place] [id number] Rush Farmer MD HW:[id number]    Source (AFB) BRONCHIAL ALVEOLAR LAVAGE  Final    Comment: Performed at [facility], [place] [address], [place], [place] [address]  Fungus Culture With Stain     Status: None (Preliminary result)   Collection Time: 09/22/19  3:12 PM   Specimen: Bronchial Alveolar Lavage  Result Value Ref Range Status   Fungus Stain Final report  Final    Comment: (NOTE) Performed At: [facility] [place], [place] [id number] Rush Farmer MD NI:[id number]    Fungus (Mycology) Culture PENDING  Incomplete   Fungal Source BRONCHIAL ALVEOLAR LAVAGE  Final    Comment: Performed at [facility], [place] [address] [place] [address] [place], [place] [address]  Novel Coronavirus, NAA [facility] order, Send-out to Ref Lab; TAT 18-24 hrs     Status: None   Collection Time: 09/22/19  3:12 PM  Result Value Ref Range Status   SARS-CoV-2, NAA NOT DETECTED NOT DETECTED Final    Comment: (NOTE) This nucleic acid amplification test was developed and its performance characteristics determined by [employer]. Nucleic acid amplification tests include RT-PCR and TMA. This test has not been FDA cleared or approved. This test has been authorized by FDA under an Emergency Use Authorization (EUA). This test is only authorized for the  duration of time the declaration that circumstances exist justifying the authorization of the emergency use of in vitro diagnostic tests for detection of SARS-CoV-2 virus and/or  diagnosis of COVID-19 infection under section 564(b)(1) of the Act, 21 U.S.C. 240XBD-5(H) (1), unless the authorization is terminated or revoked sooner. When diagnostic testing is negative, the possibility of a false negative result should be considered in the context of a patient's recent exposures and the presence of clinical signs and symptoms consistent with COVID-19. An individual without symptoms of COVID- 19 and who is not shedding SARS-CoV-2  virus would expect to have a negative (not detected) result in this assay. Performed At: [facility] [address] [place], [place] [id number] Rush Farmer MD MH:[id number]    [place]  Final    Comment: Performed at [facility] [address], [place], [place] [address]  Fungus Culture Result     Status: None   Collection Time: 09/22/19  3:12 PM  Result Value Ref Range Status   Result 1 Comment  Final    Comment: (NOTE) KOH/Calcofluor preparation:  no fungus observed. Performed At: [facility] [place], [place] [id number] Rush Farmer MD [id number]   Pneumocystis smear by DFA     Status: None   Collection Time: 09/22/19  3:15 PM   Specimen: Bronchoalveolar Lavage; Respiratory  Result Value Ref Range Status   Specimen Source-PJSRC BRONCHIAL ALVEOLAR LAVAGE  Final   Pneumocystis jiroveci Ag NEGATIVE  Final    Comment: Performed at [place] Performed at [facility] [address] [place], [place] [address]   Culture, respiratory (non-expectorated)     Status: None   Collection Time: 09/24/19 11:44 AM   Specimen: Tracheal Aspirate; Respiratory  Result Value Ref Range Status   Specimen Description   Final    TRACHEAL ASPIRATE Performed at [facility] [address], [place], [place] [address]    Special Requests   Final    NONE Performed at [facility],  [place] [address], [place], [place] [address]    Gram Stain   Final    ABUNDANT WBC PRESENT, PREDOMINANTLY PMN NO ORGANISMS SEEN Performed at [facility], [place] [address], [place], [place] [address]    Culture FEW CANDIDA ALBICANS  Final   Report Status 09/27/2019 FINAL  Final         Radiology Studies: DG CHEST PORT 1 VIEW  Result Date: 09/30/2019 CLINICAL DATA:  Hypoxia EXAM: PORTABLE CHEST 1 VIEW COMPARISON:  September 25, 2019 FINDINGS: Endotracheal tube and nasogastric tube have been removed. No pneumothorax. There is cardiomegaly with pulmonary venous hypertension. There is ill-defined airspace opacity in each lower lobe with mild interstitial prominence, likely representing edema. There is an equivocal right pleural effusion there is aortic atherosclerosis. No adenopathy. No bone lesions. IMPRESSION: Pulmonary vascular congestion with ill-defined airspace opacity in the lower lung regions and mild interstitial edema. Equivocal right pleural effusion. These findings may be indicative of congestive heart failure. A degree of multifocal pneumonia superimposed is questioned. Appearance similar to 1 day prior allowing for differences in positioning. Aortic Atherosclerosis (ICD10-I70.0). Electronically Signed   By: Lowella Grip III M.D.   On: 09/30/2019 10:22        Scheduled Meds: . sodium chloride   Intravenous Once  . allopurinol  100 mg Oral Daily  . amiodarone  200 mg Oral Daily  . amLODipine  5  mg Oral Daily  . atorvastatin  40 mg Oral q1800  . feeding supplement (ENSURE ENLIVE)  237 mL Oral BID BM  . feeding supplement (PRO-STAT SUGAR FREE 64)  30 mL Oral BID  . heparin injection (subcutaneous)  5,000 Units Subcutaneous Q8H  . hydrocerin   Topical BID  . insulin aspart  0-6 Units Subcutaneous TID WC  . latanoprost  1 drop Both Eyes QHS  . levothyroxine  88 mcg Oral Q0600  . mouth rinse  15 mL Mouth Rinse BID  . multivitamin with minerals  1 tablet Oral Daily  .  sennosides  5 mL Oral BID  . sildenafil  20 mg Oral TID  . sodium chloride flush  10-40 mL Intracatheter Q12H  . sodium chloride flush  3 mL Intravenous Q12H   Continuous Infusions: . sodium chloride Stopped (09/21/19 0756)  . sodium chloride       LOS: 10 days    Time spent: 35 minutes.  Domenic Polite, MD [facility]   09/30/2019, 2:37 PM

## 2019-10-01 LAB — BASIC METABOLIC PANEL
Anion gap: 8 (ref 5–15)
BUN: 69 mg/dL — ABNORMAL HIGH (ref 8–23)
CO2: 26 mmol/L (ref 22–32)
Calcium: 9.2 mg/dL (ref 8.9–10.3)
Chloride: 103 mmol/L (ref 98–111)
Creatinine, Ser: 2 mg/dL — ABNORMAL HIGH (ref 0.44–1.00)
GFR calc Af Amer: 30 mL/min — ABNORMAL LOW (ref >60)
GFR calc non Af Amer: 26 mL/min — ABNORMAL LOW (ref >60)
Glucose, Bld: 128 mg/dL — ABNORMAL HIGH (ref 70–99)
Potassium: 4.4 mmol/L (ref 3.5–5.1)
Sodium: 137 mmol/L (ref 135–145)

## 2019-10-01 LAB — CBC
HCT: 26.1 % — ABNORMAL LOW (ref 36.0–46.0)
Hemoglobin: 8.3 g/dL — ABNORMAL LOW (ref 12.0–15.0)
MCH: 28.7 pg (ref 26.0–34.0)
MCHC: 31.8 g/dL (ref 30.0–36.0)
MCV: 90.3 fL (ref 80.0–100.0)
Platelets: 237 10*3/uL (ref 150–400)
RBC: 2.89 MIL/uL — ABNORMAL LOW (ref 3.87–5.11)
RDW: 19.7 % — ABNORMAL HIGH (ref 11.5–15.5)
WBC: 9.3 10*3/uL (ref 4.0–10.5)
nRBC: 1.7 % — ABNORMAL HIGH (ref 0.0–0.2)

## 2019-10-01 LAB — GLUCOSE, CAPILLARY
Glucose-Capillary: 221 mg/dL — ABNORMAL HIGH (ref 70–99)
Glucose-Capillary: 215 mg/dL — ABNORMAL HIGH (ref 70–99)
Glucose-Capillary: 173 mg/dL — ABNORMAL HIGH (ref 70–99)

## 2019-10-01 NOTE — Progress Notes (Signed)
Occupational Therapy Treatment Patient Details Name: Patricia Mata MRN: [id number] DOB: August 28, 1955 Today's Date: 10/01/2019    History of present illness This is a 64 yo with history of IDDM, HTN, CKD, Pulm HTN, gout, colon cancer who presents for recurrence of pna like symptoms after treatment for CAP for 5 days on previous admisison from 3/02-3/05.Marland Kitchen required intubation, extubated 09/25/19.   OT comments  Pt. Seen for skilled OT treatment session. Able to complete bed mobility, and short distance ambulation to/from b.room for simulated toileting tasks.  Also able to demonstrate LB dressing with socks seated eob.  Will continue with LB adls next session and begin education on HEP for increasing endurance and activity tolerance.    Follow Up Recommendations  Home health OT;Supervision/Assistance - 24 hour    Equipment Recommendations  3 in 1 bedside commode    Recommendations for Other Services      Precautions / Restrictions Precautions Precautions: Fall Precaution Comments: watch O2 stats       Mobility Bed Mobility Overal bed mobility: Needs Assistance Bed Mobility: Supine to Sit     Supine to sit: Supervision        Transfers Overall transfer level: Needs assistance Equipment used: None Transfers: Sit to/from Stand;Stand Pivot Transfers Sit to Stand: Supervision Stand pivot transfers: Supervision            Balance                                           ADL either performed or assessed with clinical judgement   ADL Overall ADL's : Needs assistance/impaired                     Lower Body Dressing: Set up;Sitting/lateral leans Lower Body Dressing Details (indicate cue type and reason): don and adjust socks Toilet Transfer: Supervision/safety;Ambulation;Cueing for safety Toilet Transfer Details (indicate cue type and reason): cues for how to ambulate with o2 tubing         Functional mobility during ADLs:  Supervision/safety General ADL Comments: moving well.  reports not having o2 prior to hospitalization.  if returning home on it needs cont. practice with management of tubing during ambulation.  demonstrates good tech. with LB dressing.  reviewed not bending forward to reach BLES for safety and energy conservation educated on other ways to position to reach B LES     Vision       Perception     Praxis      Cognition Arousal/Alertness: Awake/alert Behavior During Therapy: [facility] for tasks assessed/performed                                            Exercises     Shoulder Instructions       General Comments      Pertinent Vitals/ Pain       Pain Assessment: No/denies pain  Home Living                                          Prior Functioning/Environment              Frequency  Min 2X/week  Progress Toward Goals  OT Goals(current goals can now be found in the care plan section)  Progress towards OT goals: Progressing toward goals     Plan      Co-evaluation                 AM-PAC OT "6 Clicks" Daily Activity     Outcome Measure   Help from another person eating meals?: A Little Help from another person taking care of personal grooming?: A Little Help from another person toileting, which includes using toliet, bedpan, or urinal?: A Lot Help from another person bathing (including washing, rinsing, drying)?: A Lot Help from another person to put on and taking off regular upper body clothing?: A Little Help from another person to put on and taking off regular lower body clothing?: A Lot 6 Click Score: 15    End of Session Equipment Utilized During Treatment: Oxygen  OT Visit Diagnosis: Unsteadiness on feet (R26.81);Muscle weakness (generalized) (M62.81)   Activity Tolerance Patient tolerated treatment well   Patient Left in chair;with call bell/phone within reach   Nurse Communication           Time: 4315-4008 OT Time Calculation (min): 19 min  Charges: OT General Charges $OT Visit: 1 Visit OT Treatments $Self Care/Home Management : 8-22 mins  Sonia Baller, COTA/L Acute Rehabilitation [phone]   Janice Coffin 10/01/2019, 12:28 PM

## 2019-10-01 NOTE — Progress Notes (Signed)
PROGRESS NOTE    Patricia Mata  JQB:[id number] DOB: October 26, 1955 DOA: 09/20/2019 PCP: [name], FNP  Brief Narrative: 64 year old with past medical history significant for chronic diastolic CHF, pulmonary hypertension, severe aortic stenosis, moderate MR, paroxysmal A. fib, myelodysplastic syndrome, CKD4, chronic anemia, colon cancer who presented with recurrence of pneumonia-like symptoms after treated for CAP for 5 days on previous admission from 3/2 until 3/5. Patient admitted with acute hypoxic respiratory failure secondary to pneumonia.  Patient requires significant oxygen supplementation, subsequently she was intubated on 3/9 and extubated 3-13.   -Followed by cardiology this admission, treated with diuretics  Assessment & Plan:   Acute hypoxic respiratory failure requiring mechanical ventilation this admission from 3/9 on ~3/13.  -Previously treated for Covid pneumonia in September 2020 -Secondary to healthcare associated pneumonia and acute on chronic diastolic CHF -Completed antibiotics course of IV cefepime, diuresed with IV Lasix as well -Improving slowly -Wean O2 as tolerated  Pulmonary hypertension,  -continue with sildenafil -Diuretics, see please see below  Acute on chronic diastolic CHF Severe aortic stenosis:  Moderate mitral regurgitation Paroxysmal atrial fibrillation -not a candidate for TAVR per cardiology note on 09/26/2019. -Clinically appeared more volume overloaded yesterday, chest x-ray also noted worsening fluid overload , restarted on IV Lasix yesterday with good clinical response  -Continue IV Lasix today, kidney function is stable/improving  -Monitor I's/O, daily weights, be met in a.m. -Not on anticoagulation per cardiology given recurrent transfusion dependent anemia -Continue oral amiodarone  Myelodysplasia Chronic anemia -Transfusion dependent, followed at the cancer center by Dr. Alen Blew -Hemoglobin has trended down to 7.5 on 3/17 and was  transfused a unit of PRBC,  -Hemoglobin stable now -Recent anemia panel suggestive of chronic disease  DM, with hypoglycemia;  -CBG stable now, continue sliding scale insulin, Lantus  CKD stage  IIIb -Baseline creatinine around 1.8, peaked to 2.5 this admission  -Now improving, creatinine is 2.0 -Continue IV Lasix as noted above  Hypertension: Continue with amlodipine Avoid  vasodilators due to severe aortic stenosis  Left adrenal nodule: Need to follow-up  DVT prophylaxis: Heparin Code Status: Full code Family Communication: No family at bedside, called and updated patient's son Mr. Ronnald Ramp yesterday  disposition Plan: Home with home health services in 1 to 2 days pending improvement in volume status, respiratory failure   Consultants:   PCCM  Cardiology  Procedures:   Mechanical intubation from 3/9 until 3/13  Antimicrobials:  Completed course of cefepime for 7 days  Subjective: -Breathing a little better today, feels tired, oral intake is fair, ambulating a little  Objective: Vitals:   09/30/19 2041 10/01/19 0433 10/01/19 1008 10/01/19 1215  BP: (!) 154/49 (!) 160/59 (!) 162/56 (!) 166/60  Pulse: 61 (!) 58 (!) 57 60  Resp: _0 Temp: 98.5 F (36.9 C) 98.5 F (36.9 C) 97.8 F (36.6 C) 98 F (36.7 C)  TempSrc: Oral Oral Oral Axillary  SpO2: 96% 95% 97% 96%  Weight:  81 kg    Height:        Intake/Output Summary (Last 24 hours) at 10/01/2019 1407 Last data filed at 10/01/2019 1010 Gross per 24 hour  Intake 460 ml  Output --  Net 460 ml   Filed Weights   09/29/19 0500 09/30/19 0113 10/01/19 0433  Weight: 86 kg 83.4 kg 81 kg    Examination: Gen: Pleasant average built African-American female sitting up in bed, AAOx3, no distress HEENT: PERRLA, Neck supple, positive JVD Lungs: Bibasilar fine crackles noted CVS: S1-S2  regular rate rhythm, systolic ejection murmur Abd: soft, Non tender, non distended, BS present Extremities: Trace edema Skin:  no new rashes on exposed skin    Data Reviewed: I have personally reviewed following labs and imaging studies  CBC: Recent Labs  Lab 09/25/19 0448 09/25/19 0448 09/26/19 0343 09/27/19 0557 09/28/19 0424 09/30/19 0427 10/01/19 0423  WBC 19.0*   < > 20.6* 13.2* 10.6* 12.8* 9.3  NEUTROABS 14.0*  --  15.7*  --   --   --   --   HGB 7.3*   < > 7.9* 8.0* 7.5* 8.3* 8.3*  HCT 22.8*   < > 25.0* 24.8* 24.2* 25.8* 26.1*  MCV 92.3   < > 92.3 94.3 93.8 91.5 90.3  PLT 240   < > 263 256 257 219 237   < > = values in this interval not displayed.   Basic Metabolic Panel: Recent Labs  Lab 09/25/19 0448 09/25/19 0448 09/26/19 0343 09/27/19 0557 09/28/19 0424 09/30/19 0427 10/01/19 0423  NA 139   < > 145 145 143 140 137  K 4.2   < > 4.2 4.4 3.9 4.3 4.4  CL 110   < > 111 110 111 110 103  CO2 20*   < > _0 GLUCOSE 214*   < > 97 105* 73 127* 128*  BUN 87*   < > 74* 68* 63* 61* 69*  CREATININE 2.57*   < > 2.37* 2.15* 2.14* 2.21* 2.00*  CALCIUM 8.7*   < > 9.1 9.2 9.2 8.9 9.2  MG 1.8  --   --   --  1.6*  --   --   PHOS 2.7  --   --   --  3.3  --   --    < > = values in this interval not displayed.   GFR: Estimated Creatinine Clearance: 29.2 mL/min (A) (by C-G formula based on SCr of 2 mg/dL (H)). Liver Function Tests: No results for input(s): AST, ALT, ALKPHOS, BILITOT, PROT, ALBUMIN in the last 168 hours. No results for input(s): LIPASE, AMYLASE in the last 168 hours. No results for input(s): AMMONIA in the last 168 hours. Coagulation Profile: No results for input(s): INR, PROTIME in the last 168 hours. Cardiac Enzymes: No results for input(s): CKTOTAL, CKMB, CKMBINDEX, TROPONINI in the last 168 hours. BNP (last 3 results) No results for input(s): PROBNP in the last 8760 hours. HbA1C: No results for input(s): HGBA1C in the last 72 hours. CBG: Recent Labs  Lab 09/30/19 0801 09/30/19 1212 09/30/19 1643 09/30/19 2039 10/01/19 1237  GLUCAP 120* 227* 248* 136* 221*    Lipid Profile: No results for input(s): CHOL, HDL, LDLCALC, TRIG, CHOLHDL, LDLDIRECT in the last 72 hours. Thyroid Function Tests: No results for input(s): TSH, T4TOTAL, FREET4, T3FREE, THYROIDAB in the last 72 hours. Anemia Panel: No results for input(s): VITAMINB12, FOLATE, FERRITIN, TIBC, IRON, RETICCTPCT in the last 72 hours. Sepsis Labs: No results for input(s): PROCALCITON, LATICACIDVEN in the last 168 hours.  Recent Results (from the past 240 hour(s))  Culture, bal-quantitative     Status: None   Collection Time: 09/22/19  3:12 PM   Specimen: Bronchoalveolar Lavage; Respiratory  Result Value Ref Range Status   Specimen Description   Final    BRONCHIAL ALVEOLAR LAVAGE Performed at [facility] [address] [place], [place] [address]    Special Requests   Final    Normal Performed at [facility], [place]  [address] [place], [place] [address]    Gram Stain   Final    FEW WBC PRESENT,BOTH PMN AND MONONUCLEAR NO ORGANISMS SEEN    Culture   Final    NO GROWTH 2 DAYS Performed at [facility], [place] [address], [place], [place] [address]    Report Status 09/24/2019 FINAL  Final  Acid Fast Smear (AFB)     Status: None   Collection Time: 09/22/19  3:12 PM   Specimen: Bronchial Alveolar Lavage  Result Value Ref Range Status   AFB Specimen Processing Concentration  Final   Acid Fast Smear Negative  Final    Comment: (NOTE) Performed At: [facility] [address] [place], [place] [id number] Rush Farmer MD [id number]    Source (AFB) BRONCHIAL ALVEOLAR LAVAGE  Final    Comment: Performed at [facility], [place] [address], [place], [place] [address]  Fungus Culture With Stain     Status: None (Preliminary result)   Collection Time: 09/22/19  3:12 PM   Specimen: Bronchial Alveolar Lavage  Result Value Ref Range Status   Fungus Stain Final report  Final    Comment: (NOTE) Performed At: [facility] [place], [place] [id number] Rush Farmer MD [id number]    Fungus (Mycology) Culture PENDING  Incomplete   Fungal Source BRONCHIAL ALVEOLAR LAVAGE  Final    Comment: Performed at [facility], [place] [address] [place], [place] [address]  Novel Coronavirus, NAA [facility] order, Send-out to Ref Lab; TAT 18-24 hrs     Status: None   Collection Time: 09/22/19  3:12 PM  Result Value Ref Range Status   SARS-CoV-2, NAA NOT DETECTED NOT DETECTED Final    Comment: (NOTE) This nucleic acid amplification test was developed and its performance characteristics determined by [employer]. Nucleic acid amplification tests include RT-PCR and TMA. This test has not been FDA cleared or approved. This test has been authorized by FDA under an Emergency Use Authorization (EUA). This test is only authorized for the duration of time the declaration that circumstances exist justifying the authorization of the emergency use of in vitro diagnostic tests for detection of SARS-CoV-2 virus and/or diagnosis of COVID-19 infection under section 564(b)(1) of the Act, 21 U.S.C. 832PQD-8(Y) (1), unless the authorization is terminated or revoked sooner. When diagnostic testing is negative, the possibility of a false negative result should be considered in the context of a patient's recent exposures and the presence of clinical signs and symptoms consistent with COVID-19. An individual without symptoms of COVID- 19 and who is not shedding SARS-CoV-2  virus would expect to have a negative (not detected) result in this assay. Performed At: [facility] [address] [place], [place] [id number] Rush Farmer MD [id number]    [place]  Final    Comment: Performed at [facility] [address] [place], [place] [address]  Fungus Culture Result     Status: None   Collection Time: 09/22/19  3:12 PM    Result Value Ref Range Status   Result 1 Comment  Final    Comment: (NOTE) KOH/Calcofluor preparation:  no fungus observed. Performed At: [facility] [place], [place] [id number] Rush Farmer MD [id number]   Pneumocystis smear by DFA     Status: None   Collection Time: 09/22/19  3:15 PM   Specimen: Bronchoalveolar Lavage; Respiratory  Result Value Ref Range Status   Specimen Source-[facility] BRONCHIAL ALVEOLAR LAVAGE  Final   Pneumocystis jiroveci Ag NEGATIVE  Final    Comment: Performed at [facility] Performed at [facility] [address], [place], [place] [address]   Culture, respiratory (non-expectorated)     Status: None   Collection Time: 09/24/19 11:44 AM   Specimen: Tracheal Aspirate; Respiratory  Result Value Ref Range Status   Specimen Description   Final    TRACHEAL ASPIRATE Performed at [facility] [address] [place], [place] [address]    Special Requests   Final    NONE Performed at [facility], [place] [address], [place], [place] [address]    Gram Stain   Final    ABUNDANT WBC PRESENT, PREDOMINANTLY PMN NO ORGANISMS SEEN Performed at [facility], [place] [address], [place], [place] [address]    Culture FEW CANDIDA ALBICANS  Final   Report Status 09/27/2019 FINAL  Final         Radiology Studies: DG CHEST PORT 1 VIEW  Result Date: 09/30/2019 CLINICAL DATA:  Hypoxia EXAM: PORTABLE CHEST 1 VIEW COMPARISON:  September 25, 2019 FINDINGS: Endotracheal tube and nasogastric tube have been removed. No pneumothorax. There is cardiomegaly with pulmonary venous hypertension. There is ill-defined airspace opacity in each lower lobe with mild interstitial prominence, likely representing edema. There is an equivocal right pleural effusion there is aortic atherosclerosis. No adenopathy. No bone lesions. IMPRESSION: Pulmonary vascular congestion with ill-defined  airspace opacity in the lower lung regions and mild interstitial edema. Equivocal right pleural effusion. These findings may be indicative of congestive heart failure. A degree of multifocal pneumonia superimposed is questioned. Appearance similar to 1 day prior allowing for differences in positioning. Aortic Atherosclerosis (ICD10-I70.0). Electronically Signed   By: Lowella Grip III M.D.   On: 09/30/2019 10:22        Scheduled Meds: . sodium chloride   Intravenous Once  . allopurinol  100 mg Oral Daily  . amiodarone  200 mg Oral Daily  . amLODipine  5 mg Oral Daily  . atorvastatin  40 mg Oral q1800  . feeding supplement (ENSURE ENLIVE)  237 mL Oral BID BM  . feeding supplement (PRO-STAT SUGAR FREE 64)  30 mL Oral BID  . furosemide  60 mg Intravenous BID  . heparin injection (subcutaneous)  5,000 Units Subcutaneous Q8H  . hydrocerin   Topical BID  . insulin aspart  0-6 Units Subcutaneous TID WC  . latanoprost  1 drop Both Eyes QHS  . levothyroxine  88 mcg Oral Q0600  . mouth rinse  15 mL Mouth Rinse BID  . multivitamin with minerals  1 tablet Oral Daily  . sennosides  5 mL Oral BID  . sildenafil  20 mg Oral TID  . sodium chloride flush  10-40 mL Intracatheter Q12H  . sodium chloride flush  3 mL Intravenous Q12H   Continuous Infusions: . sodium chloride Stopped (09/21/19 0756)  . sodium chloride       LOS: 11 days    Time spent: 35 minutes.  Domenic Polite, MD [facility]   10/01/2019, 2:07 PM

## 2019-10-02 LAB — GLUCOSE, CAPILLARY
Glucose-Capillary: 157 mg/dL — ABNORMAL HIGH (ref 70–99)
Glucose-Capillary: 299 mg/dL — ABNORMAL HIGH (ref 70–99)
Glucose-Capillary: 291 mg/dL — ABNORMAL HIGH (ref 70–99)
Glucose-Capillary: 291 mg/dL — ABNORMAL HIGH (ref 70–99)

## 2019-10-02 LAB — CBC
HCT: 28.9 % — ABNORMAL LOW (ref 36.0–46.0)
Hemoglobin: 9.3 g/dL — ABNORMAL LOW (ref 12.0–15.0)
MCH: 29.3 pg (ref 26.0–34.0)
MCHC: 32.2 g/dL (ref 30.0–36.0)
MCV: 91.2 fL (ref 80.0–100.0)
Platelets: 273 10*3/uL (ref 150–400)
RBC: 3.17 MIL/uL — ABNORMAL LOW (ref 3.87–5.11)
RDW: 19.9 % — ABNORMAL HIGH (ref 11.5–15.5)
WBC: 9.5 10*3/uL (ref 4.0–10.5)
nRBC: 0.6 % — ABNORMAL HIGH (ref 0.0–0.2)

## 2019-10-02 LAB — BASIC METABOLIC PANEL
Anion gap: 11 (ref 5–15)
BUN: 67 mg/dL — ABNORMAL HIGH (ref 8–23)
CO2: 25 mmol/L (ref 22–32)
Calcium: 9.2 mg/dL (ref 8.9–10.3)
Chloride: 99 mmol/L (ref 98–111)
Creatinine, Ser: 1.79 mg/dL — ABNORMAL HIGH (ref 0.44–1.00)
GFR calc Af Amer: 34 mL/min — ABNORMAL LOW (ref >60)
GFR calc non Af Amer: 29 mL/min — ABNORMAL LOW (ref >60)
Glucose, Bld: 150 mg/dL — ABNORMAL HIGH (ref 70–99)
Potassium: 5.1 mmol/L (ref 3.5–5.1)
Sodium: 135 mmol/L (ref 135–145)

## 2019-10-02 MED ORDER — FUROSEMIDE 40 MG PO TABS
40.0000 mg | ORAL_TABLET | Freq: Every day | ORAL | Status: DC
  Administered 2019-10-03: 40 mg via ORAL
  Filled 2019-10-02: qty 1

## 2019-10-02 NOTE — Progress Notes (Signed)
PROGRESS NOTE   Patricia Mata  BZJ:[id number] DOB: [birth year] DOA: 09/20/2019 PCP: [name], FNP  Brief Narrative: 64 year old with past medical history significant for chronic diastolic CHF, pulmonary hypertension, severe aortic stenosis, moderate MR, paroxysmal A. fib, myelodysplastic syndrome, CKD4, chronic anemia, colon cancer who presented with recurrence of pneumonia-like symptoms after treated for CAP for 5 days on previous admission from 3/2 until 3/5. Patient admitted with acute hypoxic respiratory failure secondary to pneumonia.  Patient requires significant oxygen supplementation, subsequently she was intubated on 3/9 and extubated 3-13.   -Followed by cardiology this admission, treated with diuretics  Assessment & Plan:   Acute hypoxic respiratory failure requiring mechanical ventilation this admission from 3/9 on ~3/13.  -Previously treated for Covid pneumonia in September 2020 -Secondary to healthcare associated pneumonia and acute on chronic diastolic CHF -Completed antibiotics course of IV cefepime, diuresed with IV Lasix as well -Improving clinically, weaned off oxygen finally  Pulmonary hypertension,  -continue with sildenafil -Diuretics, see please see below  Acute on chronic diastolic CHF Severe aortic stenosis:  Moderate mitral regurgitation Paroxysmal atrial fibrillation -not a candidate for TAVR per cardiology note on 09/26/2019. -Diuresed with IV Lasix initially and then transition to oral diuretics, subsequently developed volume overload, as noted on repeat x-ray 3/18  -restarted on IV Lasix with good clinical response  -Clinically appears euvolemic, changed to oral Lasix today -Not on anticoagulation per cardiology given recurrent transfusion dependent anemia -Continue oral amiodarone  Myelodysplasia Chronic anemia -Transfusion dependent, followed at the cancer center by Dr. Alen Blew -Hemoglobin has trended down to 7.5 on 3/17 and was transfused a unit of  PRBC,  -Hemoglobin stable now -Recent anemia panel suggestive of chronic disease  DM, with hypoglycemia;  -CBG stable now, continue sliding scale insulin, Lantus  CKD stage  IIIb -Baseline creatinine around 1.8, peaked to 2.5 this admission  -Now improving, creatinine <2 -Changed to oral diuretics  Hypertension: Continue with amlodipine Avoid  vasodilators due to severe aortic stenosis  Left adrenal nodule: Need to follow-up  DVT prophylaxis: Heparin Code Status: Full code Family Communication: No family at bedside, called and updated patient's son Mr. Ronnald Ramp yesterday  disposition Plan: Home tomorrow if stable   Consultants:   PCCM  Cardiology  Procedures:   Mechanical intubation from 3/9 until 3/13  Antimicrobials:  Completed course of cefepime for 7 days  Subjective: -Feels better, breathing is improving, was able to come off oxygen this morning  Objective: Vitals:   10/01/19 2009 10/02/19 0412 10/02/19 0900 10/02/19 1338  BP: (!) 163/54 (!) 158/46  138/90  Pulse: 60 (!) 59  61  Resp: _0 Temp: 98.7 F (37.1 C) 98.6 F (37 C)  (!) 97.5 F (36.4 C)  TempSrc: Oral Oral  Oral  SpO2: 97% 94% 99% 95%  Weight:  79.2 kg    Height:        Intake/Output Summary (Last 24 hours) at 10/02/2019 1354 Last data filed at 10/02/2019 1048 Gross per 24 hour  Intake 620 ml  Output 700 ml  Net -80 ml   Filed Weights   09/30/19 0113 10/01/19 0433 10/02/19 0412  Weight: 83.4 kg 81 kg 79.2 kg    Examination: Gen: Pleasant built African-American female sitting up in bed, AAOx3, no distress HEENT: PERRLA, Neck supple, no JVD Lungs: Improved air movement, decreased at the bases, rest clear CVS: S1-S2, regular rate rhythm, systolic ejection murmur Abd: soft, Non tender, non distended, BS present Extremities: No edema Skin: no new  rashes on exposed skin    Data Reviewed: I have personally reviewed following labs and imaging studies  CBC: Recent Labs  Lab  09/26/19 0343 09/26/19 0343 09/27/19 0557 09/28/19 0424 09/30/19 0427 10/01/19 0423 10/02/19 0333  WBC 20.6*   < > 13.2* 10.6* 12.8* 9.3 9.5  NEUTROABS 15.7*  --   --   --   --   --   --   HGB 7.9*   < > 8.0* 7.5* 8.3* 8.3* 9.3*  HCT 25.0*   < > 24.8* 24.2* 25.8* 26.1* 28.9*  MCV 92.3   < > 94.3 93.8 91.5 90.3 91.2  PLT 263   < > 256 257 219 237 273   < > = values in this interval not displayed.   Basic Metabolic Panel: Recent Labs  Lab 09/27/19 0557 09/28/19 0424 09/30/19 0427 10/01/19 0423 10/02/19 0333  NA 145 143 140 137 135  K 4.4 3.9 4.3 4.4 5.1  CL 110 111 110 103 99  CO2 _0 GLUCOSE 105* 73 127* 128* 150*  BUN 68* 63* 61* 69* 67*  CREATININE 2.15* 2.14* 2.21* 2.00* 1.79*  CALCIUM 9.2 9.2 8.9 9.2 9.2  MG  --  1.6*  --   --   --   PHOS  --  3.3  --   --   --    GFR: Estimated Creatinine Clearance: 32.3 mL/min (A) (by C-G formula based on SCr of 1.79 mg/dL (H)). Liver Function Tests: No results for input(s): AST, ALT, ALKPHOS, BILITOT, PROT, ALBUMIN in the last 168 hours. No results for input(s): LIPASE, AMYLASE in the last 168 hours. No results for input(s): AMMONIA in the last 168 hours. Coagulation Profile: No results for input(s): INR, PROTIME in the last 168 hours. Cardiac Enzymes: No results for input(s): CKTOTAL, CKMB, CKMBINDEX, TROPONINI in the last 168 hours. BNP (last 3 results) No results for input(s): PROBNP in the last 8760 hours. HbA1C: No results for input(s): HGBA1C in the last 72 hours. CBG: Recent Labs  Lab 10/01/19 1237 10/01/19 1650 10/01/19 2013 10/02/19 0804 10/02/19 1131  GLUCAP 221* 215* 173* 157* 299*   Lipid Profile: No results for input(s): CHOL, HDL, LDLCALC, TRIG, CHOLHDL, LDLDIRECT in the last 72 hours. Thyroid Function Tests: No results for input(s): TSH, T4TOTAL, FREET4, T3FREE, THYROIDAB in the last 72 hours. Anemia Panel: No results for input(s): VITAMINB12, FOLATE, FERRITIN, TIBC, IRON, RETICCTPCT in  the last 72 hours. Sepsis Labs: No results for input(s): PROCALCITON, LATICACIDVEN in the last 168 hours.  Recent Results (from the past 240 hour(s))  Culture, bal-quantitative     Status: None   Collection Time: 09/22/19  3:12 PM   Specimen: Bronchoalveolar Lavage; Respiratory  Result Value Ref Range Status   Specimen Description   Final    BRONCHIAL ALVEOLAR LAVAGE Performed at [facility] [address], [place], [place] [address]    Special Requests   Final    Normal Performed at [facility], [place] [address] [place], [place] [address]    Gram Stain   Final    FEW WBC PRESENT,BOTH PMN AND MONONUCLEAR NO ORGANISMS SEEN    Culture   Final    NO GROWTH 2 DAYS Performed at [facility], [place] [address] [place], [place] [address]    Report Status 09/24/2019 FINAL  Final  Acid Fast Smear (AFB)     Status: None   Collection Time: 09/22/19  3:12 PM  Specimen: Bronchial Alveolar Lavage  Result Value Ref Range Status   AFB Specimen Processing Concentration  Final   Acid Fast Smear Negative  Final    Comment: (NOTE) Performed At: [facility] [place], [place] [id number] Rush Farmer MD SJ:[id number]    Source (AFB) BRONCHIAL ALVEOLAR LAVAGE  Final    Comment: Performed at [facility] [address] [place], [place] [address]  Fungus Culture With Stain     Status: None (Preliminary result)   Collection Time: 09/22/19  3:12 PM   Specimen: Bronchial Alveolar Lavage  Result Value Ref Range Status   Fungus Stain Final report  Final    Comment: (NOTE) Performed At: [facility] [place], [place] [id number] Rush Farmer MD EX:[id number]    Fungus (Mycology) Culture PENDING  Incomplete   Fungal Source BRONCHIAL ALVEOLAR LAVAGE  Final    Comment: Performed at [facility], [place] [address] [place], [place] [address]  Novel Coronavirus, [facility]  order, Send-out to Ref Lab; TAT 18-24 hrs     Status: None   Collection Time: 09/22/19  3:12 PM  Result Value Ref Range Status   SARS-CoV-2, NAA NOT DETECTED NOT DETECTED Final    Comment: (NOTE) This nucleic acid amplification test was developed and its performance characteristics determined by [employer]. Nucleic acid amplification tests include RT-PCR and TMA. This test has not been FDA cleared or approved. This test has been authorized by FDA under an Emergency Use Authorization (EUA). This test is only authorized for the duration of time the declaration that circumstances exist justifying the authorization of the emergency use of in vitro diagnostic tests for detection of SARS-CoV-2 virus and/or diagnosis of COVID-19 infection under section 564(b)(1) of the Act, 21 U.S.C. 916BWG-6(K) (1), unless the authorization is terminated or revoked sooner. When diagnostic testing is negative, the possibility of a false negative result should be considered in the context of a patient's recent exposures and the presence of clinical signs and symptoms consistent with COVID-19. An individual without symptoms of COVID- 19 and who is not shedding SARS-CoV-2  virus would expect to have a negative (not detected) result in this assay. Performed At: [facility] [address] [place], [place] [id number] Rush Farmer MD BL:[id number]    Kathryn  Final    Comment: Performed at [facility] [address], [place], [place] [address]  Fungus Culture Result     Status: None   Collection Time: 09/22/19  3:12 PM  Result Value Ref Range Status   Result 1 Comment  Final    Comment: (NOTE) KOH/Calcofluor preparation:  no fungus observed. Performed At: [facility] [place], [place] [id number] Rush Farmer MD YB:[id number]   Pneumocystis smear by DFA     Status: None   Collection Time: 09/22/19  3:15 PM     Specimen: Bronchoalveolar Lavage; Respiratory  Result Value Ref Range Status   Specimen Source-PJSRC BRONCHIAL ALVEOLAR LAVAGE  Final   Pneumocystis jiroveci Ag NEGATIVE  Final    Comment: Performed at [facility] Performed at [facility] [address], [place], [place] [address]   Culture, respiratory (non-expectorated)     Status: None   Collection Time: 09/24/19 11:44 AM   Specimen: Tracheal Aspirate; Respiratory  Result Value Ref Range Status   Specimen Description   Final    TRACHEAL ASPIRATE Performed at [facility], [place] [address] [place], [place] [address]    Special Requests   Final    NONE Performed at [facility], [place] [address] [place], [place] [address]    Gram Stain   Final    ABUNDANT WBC PRESENT, PREDOMINANTLY PMN NO ORGANISMS SEEN Performed at [facility], [place] [address] [place], [place] [address]    Culture FEW CANDIDA ALBICANS  Final   Report Status 09/27/2019 FINAL  Final         Radiology Studies: No results found.      Scheduled Meds: . sodium chloride   Intravenous Once  . allopurinol  100 mg Oral Daily  . amiodarone  200 mg Oral Daily  . amLODipine  5 mg Oral Daily  . atorvastatin  40 mg Oral q1800  . feeding supplement (ENSURE ENLIVE)  237 mL Oral BID BM  . feeding supplement (PRO-STAT SUGAR FREE 64)  30 mL Oral BID  . furosemide  40 mg Oral Daily  . heparin injection (subcutaneous)  5,000 Units Subcutaneous Q8H  . hydrocerin   Topical BID  . insulin aspart  0-6 Units Subcutaneous TID WC  . latanoprost  1 drop Both Eyes QHS  . levothyroxine  88 mcg Oral Q0600  . mouth rinse  15 mL Mouth Rinse BID  . multivitamin with minerals  1 tablet Oral Daily  . sennosides  5 mL Oral BID  . sildenafil  20 mg Oral TID  . sodium chloride flush  10-40 mL Intracatheter Q12H  . sodium chloride flush  3 mL Intravenous Q12H   Continuous Infusions: . sodium  chloride Stopped (09/21/19 0756)  . sodium chloride       LOS: 12 days    Time spent: 25 minutes.  Domenic Polite, MD [facility]   10/02/2019, 1:54 PM

## 2019-10-03 DIAGNOSIS — Z4659 Encounter for fitting and adjustment of other gastrointestinal appliance and device: Secondary | ICD-10-CM

## 2019-10-03 LAB — BASIC METABOLIC PANEL
Anion gap: 10 (ref 5–15)
BUN: 69 mg/dL — ABNORMAL HIGH (ref 8–23)
CO2: 25 mmol/L (ref 22–32)
Calcium: 9.1 mg/dL (ref 8.9–10.3)
Chloride: 99 mmol/L (ref 98–111)
Creatinine, Ser: 1.87 mg/dL — ABNORMAL HIGH (ref 0.44–1.00)
GFR calc Af Amer: 32 mL/min — ABNORMAL LOW (ref >60)
GFR calc non Af Amer: 28 mL/min — ABNORMAL LOW (ref >60)
Glucose, Bld: 163 mg/dL — ABNORMAL HIGH (ref 70–99)
Potassium: 4.4 mmol/L (ref 3.5–5.1)
Sodium: 134 mmol/L — ABNORMAL LOW (ref 135–145)

## 2019-10-03 LAB — CBC
HCT: 26.9 % — ABNORMAL LOW (ref 36.0–46.0)
Hemoglobin: 8.8 g/dL — ABNORMAL LOW (ref 12.0–15.0)
MCH: 29.6 pg (ref 26.0–34.0)
MCHC: 32.7 g/dL (ref 30.0–36.0)
MCV: 90.6 fL (ref 80.0–100.0)
Platelets: 301 10*3/uL (ref 150–400)
RBC: 2.97 MIL/uL — ABNORMAL LOW (ref 3.87–5.11)
RDW: 19.2 % — ABNORMAL HIGH (ref 11.5–15.5)
WBC: 8.8 10*3/uL (ref 4.0–10.5)
nRBC: 0.3 % — ABNORMAL HIGH (ref 0.0–0.2)

## 2019-10-03 LAB — GLUCOSE, CAPILLARY: Glucose-Capillary: 161 mg/dL — ABNORMAL HIGH (ref 70–99)

## 2019-10-03 MED ORDER — FUROSEMIDE 40 MG PO TABS: 40.0000 mg | ORAL_TABLET | Freq: Every day | ORAL | Status: DC

## 2019-10-03 NOTE — Progress Notes (Signed)
Patient discharged home, discharge instructions given and explained to patient, she verbalized understanding, patient denies any pain/distress, no pressure injury noted, accompanied home by family, transported to the car by staff via wheelchair.

## 2019-10-04 ENCOUNTER — Telehealth (HOSPITAL_COMMUNITY): Payer: Self-pay | Admitting: Nurse Practitioner

## 2019-10-04 ENCOUNTER — Telehealth: Payer: Self-pay

## 2019-10-04 NOTE — Discharge Summary (Signed)
Physician Discharge Summary  Corabelle Spackman OQH:[id number] DOB: September 26, 1955 DOA: 09/20/2019  PCP: [name], FNP  Admit date: 09/20/2019 Discharge date: 10/03/2019  Time spent: 35 minutes  Recommendations for Outpatient Follow-up:  1. Heart failure clinic on 3/30, please check bmet at follow-up 2. Follow-up at [facility] Dr. Alen Blew in 1 to 2 weeks for MDS with transfusion dependent anemia 3. Check CBC in 1 week  4. [place] in 1 month  Discharge Diagnoses:  Acute hypoxic respiratory failure Acute on chronic diastolic CHF Severe aortic stenosis Moderate mitral regurgitation Moderate pulmonary hypertension Myelodysplastic syndrome Anemia of chronic disease   Anemia due to chronic kidney disease   MDS (myelodysplastic syndrome) (HCC)   Essential hypertension   Hyperlipidemia   Severe aortic stenosis   Normocytic anemia   Atrial fibrillation, transient ([place])   Acute hypoxemic respiratory failure ([place])   Encounter for nasogastric (NG) tube placement   Type 2 diabetes mellitus with hemoglobin A1c goal of less than 7.0% (HCC)   Hypothyroidism   Acute respiratory failure with hypoxia (HCC)   HCAP (healthcare-associated pneumonia)   CKD (chronic kidney disease), stage III   DM2 (diabetes mellitus, type 2) (HCC)   Elevated troponin   Severe sepsis (HCC)   ARDS (adult respiratory distress syndrome) (HCC)   Acute diastolic CHF (congestive heart failure) ([place])   Discharge Condition: Stable  Diet recommendation: Low-sodium, heart healthy  Filed Weights   10/01/19 0433 10/02/19 0412 10/03/19 0448  Weight: 81 kg 79.2 kg 78.1 kg    History of present illness:  64 year old with past medical history significant for chronic diastolic CHF, pulmonary hypertension, severe aortic stenosis, moderate MR, paroxysmal A. fib, myelodysplastic syndrome, CKD4, chronic anemia, colon cancer who presented with recurrence of pneumonia-like symptoms after treated  for CAP for 5 days on previous admission from 3/2 until 3/5. Patient admitted with acute hypoxic respiratory failure secondary to pneumonia.  Patient requires significant oxygen supplementation, subsequently she was intubated on 3/9 and extubated 3-13.   -Followed by cardiology this admission, treated with diuretics   Hospital Course:   Acute hypoxic respiratory failure requiring mechanical ventilation this admission from 3/9 on ~3/13.  -Previously treated for Covid pneumonia in September 2020 -Secondary to healthcare associated pneumonia and acute on chronic diastolic CHF -Completed antibiotics course of IV cefepime, diuresed with IV Lasix as well -Improving clinically, weaned off oxygen finally -Transitioned to oral diuretics, weaned off oxygen at discharge  Pulmonary hypertension,  -continue with sildenafil -Diuretics, see please see below  Acute on chronic diastolic CHF Severe aortic stenosis:  Moderate mitral regurgitation Paroxysmal atrial fibrillation -not a candidate for TAVR per cardiology note on 09/26/2019. -Diuresed with IV Lasix aggressively -Clinically improved, she is -8 L at discharge -Now euvolemic, changed to oral Lasix 40 mg daily at discharge -Not on anticoagulation per cardiology given recurrent transfusion dependent anemia -Continue oral amiodarone  Myelodysplasia Chronic anemia -Transfusion dependent, followed [facility] the cancer [facility] by Dr. Alen Blew -Hemoglobin has trended down to 7.5 on 3/17 and was transfused a unit of PRBC,  -Hemoglobin stable now -Recent anemia panel suggestive of chronic disease  DM, with hypoglycemia;  -CBG stable now, continue sliding scale insulin, Lantus  CKD stage  IIIb -Baseline creatinine around 1.8, peaked to 2.5 this admission  -Now improving, creatinine <2 -Changed to oral diuretics  Hypertension: Continue with amlodipine Avoid  vasodilators due to severe aortic stenosis  Left adrenal nodule: Need to  follow-up   Consultants:   PCCM  Cardiology  Procedures:   Mechanical intubation from 3/9 until 3/13    Discharge Exam: Vitals:   10/03/19 0447 10/03/19 0856  BP: (!) 163/49 (!) 156/39  Pulse: 62   Resp:    Temp: 98.9 F (37.2 C)   SpO2: 91%     General: AAOx3 Cardiovascular: K8M3/OTR systolic ejection murmur Respiratory: CTAB  Discharge Instructions   Discharge Instructions    Diet - low sodium heart healthy   Complete by: As directed    Increase activity slowly   Complete by: As directed      Allergies as of 10/03/2019      Reactions   Ancef [cefazolin] Itching   Severe itching- after procedure, ancef was the antibiotic.-04/02/17 Tolerates penicillins   Lactose Intolerance (gi) Diarrhea   Sulfa Antibiotics Rash, Other (See Comments)   Blisters, also      Medication List    STOP taking these medications   cefdinir 300 MG capsule Commonly known as: OMNICEF   doxycycline 100 MG tablet Commonly known as: VIBRA-TABS   guaiFENesin-dextromethorphan 100-10 MG/5ML syrup Commonly known as: ROBITUSSIN DM   hydrALAZINE 100 MG tablet Commonly known as: APRESOLINE     TAKE these medications   allopurinol 100 MG tablet Commonly known as: ZYLOPRIM Take 100 mg by mouth daily. Notes to patient: 10/04/19   amiodarone 200 MG tablet Commonly known as: PACERONE TAKE 1 TABLET BY MOUTH EVERY DAY Notes to patient: 10/04/19   amLODipine 10 MG tablet Commonly known as: NORVASC Take 1 tablet (10 mg total) by mouth daily. Notes to patient: 10/04/19   atorvastatin 40 MG tablet Commonly known as: LIPITOR TAKE 1 TABLET BY MOUTH EVERY DAY What changed: when to take this Notes to patient: 10/03/19   blood glucose meter kit and supplies Dispense based on patient and insurance preference. Use up to four times daily as directed. (FOR ICD-10 E10.9, E11.9).   Centrum Silver 50+Women Tabs Take 1 tablet by mouth daily. Notes to patient: 10/04/19   colchicine 0.6  MG tablet TAKE 1 TABLET BY MOUTH TWICE A DAY AS NEEDED FOR GOUT What changed: See the new instructions.   doxazosin 1 MG tablet Commonly known as: Cardura Take 1 tablet (1 mg total) by mouth at bedtime. Notes to patient: 10/03/19   furosemide 40 MG tablet Commonly known as: LASIX Take 1 tablet (40 mg total) by mouth daily. Notes to patient: 10/04/19   glucose blood test strip Use as instructed   insulin glargine 100 unit/mL Sopn Commonly known as: LANTUS Inject 0.15 mLs (15 Units total) into the skin daily. What changed:   how much to take  when to take this  reasons to take this Notes to patient: 10/03/19   Insulin Pen Needle 29G X 5MM Misc Use as directed   Januvia 50 MG tablet Generic drug: sitaGLIPtin TAKE 1 TABLET BY MOUTH EVERY DAY What changed: how much to take Notes to patient: 10/04/19   latanoprost 0.005 % ophthalmic solution Commonly known as: XALATAN Place 1 drop into both eyes at bedtime. Notes to patient: 10/03/19   levothyroxine 88 MCG tablet Commonly known as: Synthroid Take 1 tablet (88 mcg total) by mouth daily. Notes to patient: 10/04/19   OneTouch UltraLink w/Device Kit Check blood sugars twice daily E11.9   onetouch ultrasoft lancets Use as instructed to check blood sugars twice daily E11.9   oxymetazoline 0.05 % nasal spray Commonly known as: AFRIN Place 1 spray into both nostrils 2 (two) times daily as needed for congestion.  sildenafil 20 MG tablet Commonly known as: REVATIO Take 1 tablet (20 mg total) by mouth 3 (three) times daily. Notes to patient: 10/03/19      Allergies  Allergen Reactions  . Ancef [Cefazolin] Itching    Severe itching- after procedure, ancef was the antibiotic.-04/02/17 Tolerates penicillins  . Lactose Intolerance (Gi) Diarrhea  . Sulfa Antibiotics Rash and Other (See Comments)    Blisters, also   Follow-up Information    [facility] Follow up on 10/12/2019.    Specialty: Cardiology Why: Please arrive 15 minutes early for your 3:30pm post-hospital heart failure clinic appointment. You will see Lyda Jester, PA-C. The parking code is [id number].  Contact information: [address] [id number] mc [address] [place]       [name], [place]. Schedule an appointment as soon as possible for a visit in 1 week(s).   Specialty: General Practice Contact information: [address] [place] [address] [phone]            The results of significant diagnostics from this hospitalization (including imaging, microbiology, ancillary and laboratory) are listed below for reference.    Significant Diagnostic Studies: DG Abd 1 View  Result Date: 09/22/2019 CLINICAL DATA:  64 year old female NG tube placement. EXAM: ABDOMEN - 1 VIEW COMPARISON:  Portable chest 2300 hours yesterday. FINDINGS: Portable AP supine view at 0016 hours. Enteric tube has been placed into the stomach with tip at the level of the gastric antrum. Gas-filled but nondilated visible bowel loops. Coarse and confluent bilateral lower lung opacity again noted. IMPRESSION: Enteric tube placed into the stomach, tip at the gastric antrum. Electronically Signed   By: Genevie Ann M.D.   On: 09/22/2019 00:39   CT CHEST WO CONTRAST  Result Date: 09/21/2019 CLINICAL DATA:  Recurrence pneumonia. Low oxygen saturation. EXAM: CT CHEST WITHOUT CONTRAST TECHNIQUE: Multidetector CT imaging of the chest was performed following the standard protocol without IV contrast. COMPARISON:  March 26, 2011. FINDINGS: Cardiovascular: Moderate cardiomegaly. Coarse mitral annulus, aortic valvular and four-vessel coronary calcification. Pulmonary artery hypertension, the pulmonary trunk measuring 3.3 cm diameter. Central pulmonary vascular congestion. Mediastinum/Nodes: Nonspecific enlarged right paratracheal noncalcified lymph node measuring 18 by 22 mm. Additional  abnormal number of noncalcified clustered mediastinal subcentimeter short axis diameter lymph nodes. Pericardial recess fluid. Lungs/Pleura: Diffuse reticulonodular interlobular septal thickening with patchy airspace and small airways disease throughout both lungs, commonly seen in the setting of ARDS or pulmonary edema or nonspecific interstitial pneumonitis or COVID infection. Trace bilateral pleural fluid. Upper Abdomen: Mild hepatomegaly. Coarse thoracoabdominal aorta and major abdominal artery calcified atherosclerosis. A nonspecific 7 mm diameter lymph node in the left upper abdominal quadrant ventral to the left hemiliver. Partial characterization of a 15 mm 30 Hounsfield unit left adrenal nodule. No apparent abnormality of the right adrenal gland. Musculoskeletal: Mild chest wall anasarca. Mild skeletal degenerative changes. IMPRESSION: 1. Diffuse interlobular septal thickening with patchy airspace and small airways disease throughout both lungs, commonly seen in the setting of ARDS or pulmonary edema or nonspecific interstitial pneumonitis or COVID infection. 2. Trace bilateral pleural fluid. 3. Cardiomegaly and evidence of pulmonary artery hypertension. 4. Mediastinal nonspecific adenopathy and a left upper abdominal quadrant small lymph node. 5. Thoracoabdominal aorta calcified atherosclerosis and severe 4-vessel coronary calcification. 6. Partial characterization of a 15 mm 30 Hounsfield unit left adrenal nodule. Further characterization with MR abdomen adrenal protocol recommended in 3-6 months. 7. Hepatomegaly. Aortic Atherosclerosis (ICD10-I70.0). Electronically Signed  By: Revonda Humphrey   On: 09/21/2019 00:33   DG CHEST PORT 1 VIEW  Result Date: 09/30/2019 CLINICAL DATA:  Hypoxia EXAM: PORTABLE CHEST 1 VIEW COMPARISON:  September 25, 2019 FINDINGS: Endotracheal tube and nasogastric tube have been removed. No pneumothorax. There is cardiomegaly with pulmonary venous hypertension. There is ill-defined  airspace opacity in each lower lobe with mild interstitial prominence, likely representing edema. There is an equivocal right pleural effusion there is aortic atherosclerosis. No adenopathy. No bone lesions. IMPRESSION: Pulmonary vascular congestion with ill-defined airspace opacity in the lower lung regions and mild interstitial edema. Equivocal right pleural effusion. These findings may be indicative of congestive heart failure. A degree of multifocal pneumonia superimposed is questioned. Appearance similar to 1 day prior allowing for differences in positioning. Aortic Atherosclerosis (ICD10-I70.0). Electronically Signed   By: Lowella Grip III M.D.   On: 09/30/2019 10:22   DG Chest Port 1 View  Result Date: 09/25/2019 CLINICAL DATA:  Respiratory failure EXAM: PORTABLE CHEST 1 VIEW COMPARISON:  Yesterday FINDINGS: Endotracheal tube tip is at the clavicular heads. Left IJ line with tip at the SVC. The enteric tube at least reaches the stomach. Hazy bilateral pulmonary opacity that is airspace disease by CT. No pleural fluid with seen by CT. Borderline heart size IMPRESSION: Stable hardware positioning and extensive pneumonia. Electronically Signed   By: Monte Fantasia M.D.   On: 09/25/2019 06:50   DG Chest Port 1 View  Result Date: 09/24/2019 CLINICAL DATA:  Respiratory failure EXAM: PORTABLE CHEST 1 VIEW COMPARISON:  Yesterday FINDINGS: ETT tip is at the clavicular heads. Left IJ line with tip partially obscured by EKG leads, tip at the SVC. The enteric tube tip at least reaches the stomach. Low volume chest with hazy opacity on both sides, airspace disease by CT. No change from before. No visible effusion or pneumothorax. IMPRESSION: Unchanged hardware positioning and multifocal pneumonia. Electronically Signed   By: Monte Fantasia M.D.   On: 09/24/2019 07:19   DG Chest Port 1 View  Result Date: 09/23/2019 CLINICAL DATA:  Respiratory failure EXAM: PORTABLE CHEST 1 VIEW COMPARISON:  09/22/2019  FINDINGS: The endotracheal tube is 5 cm above the carina. The left IJ catheter is stable. The NG tube is stable. Persistent diffuse interstitial and airspace process in the lungs. No definite pleural effusions or pneumothorax. IMPRESSION: 1. Stable support apparatus. 2. Persistent diffuse interstitial and airspace process. Electronically Signed   By: Marijo Sanes M.D.   On: 09/23/2019 07:15   DG Chest Port 1 View  Result Date: 09/22/2019 CLINICAL DATA:  Respiratory failure. EXAM: PORTABLE CHEST 1 VIEW COMPARISON:  09/22/2019 FINDINGS: The endotracheal tube is 3.5 cm above the carina. The left IJ central venous catheter tip is in the mid SVC. The NG tube is coursing down the esophagus and into the stomach. Persistent diffuse interstitial and airspace process in the lungs. No definite pleural effusion or pneumothorax. IMPRESSION: 1. Stable support apparatus. 2. Persistent diffuse interstitial and airspace process. Electronically Signed   By: Marijo Sanes M.D.   On: 09/22/2019 08:24   DG CHEST PORT 1 VIEW  Result Date: 09/22/2019 CLINICAL DATA:  Central line placement EXAM: PORTABLE CHEST 1 VIEW COMPARISON:  September 21, 2019 FINDINGS: The endotracheal tube terminates approximately 4.2 cm above the carina. The left-sided central venous catheter is well position. The enteric tube appears to extend below the left hemidiaphragm. Diffuse bilateral pulmonary opacities are again noted with slight interval improvement. There is no pneumothorax. The heart size  is stable from prior study. IMPRESSION: 1. Lines and tubes as above.  No pneumothorax. 2. Persistent bilateral pulmonary opacities as before. Electronically Signed   By: Constance Holster M.D.   On: 09/22/2019 02:10   DG CHEST PORT 1 VIEW  Result Date: 09/21/2019 CLINICAL DATA:  Intubation. EXAM: PORTABLE CHEST 1 VIEW COMPARISON:  September 20, 2019 FINDINGS: An endotracheal tube has been placed, its tip projects 4.0 cm above the carina. There is similar lung  inflation. Cardiomegaly, interstitial edema and diffuse alveolar with patchy airspace opacities are redemonstrated. Dense consolidation obscures the cardiac borders and a slightly more confluent. There are new air bronchograms in the left suprahilar region. Aortic knob calcified atherosclerosis and skeletal degenerative changes present. There is partial imaging of gaseous gastric distension. Telemetry leads are present. There is no pneumothorax or obvious pleural effusion. The right costophrenic angle is incompletely imaged. IMPRESSION: Interval intubation, the endotracheal tube tip projecting 4.0 cm above the carina. Slight interval worsening of extensive bilateral pulmonary alveolar and airspace disease, the latter more confluent. Cardiomegaly and thoracic aortic calcified atherosclerosis. Partial imaging of gaseous gastric distension. Electronically Signed   By: Revonda Humphrey   On: 09/21/2019 23:46   DG Chest Port 1 View  Result Date: 09/20/2019 CLINICAL DATA:  Shortness of breath EXAM: PORTABLE CHEST 1 VIEW COMPARISON:  September 14, 2019 FINDINGS: Diffuse bilateral airspace opacities are again noted. The heart size is stable but enlarged. Aortic calcifications are noted. There are probable bilateral pleural effusions. There is no acute osseous abnormality. IMPRESSION: Interval worsening of bilateral airspace opacities, otherwise no significant interval change. Electronically Signed   By: Constance Holster M.D.   On: 09/20/2019 19:00   DG Chest Port 1 View  Result Date: 09/14/2019 CLINICAL DATA:  Shortness of breath, cough, congestion EXAM: PORTABLE CHEST 1 VIEW COMPARISON:  05/21/2019 FINDINGS: Bilateral patchy airspace disease noted concerning for pneumonia. Heart is borderline in size. Possible small effusions. No acute bony abnormality. IMPRESSION: Patchy bilateral airspace opacities concerning for pneumonia. Suspect small bilateral effusions. Electronically Signed   By: Rolm Baptise M.D.   On: 09/14/2019  08:17   ECHOCARDIOGRAM COMPLETE BUBBLE STUDY  Result Date: 09/21/2019    ECHOCARDIOGRAM REPORT   Patient Name:   MAYRA JOLLIFFE Date of Exam: 09/21/2019 Medical Rec #:  [id number]    Height:       64.0 in Accession #:    [id number]   Weight:       180.0 lb Date of Birth:  07/19/1955     BSA:          1.871 m Patient Age:    52 years     BP:           145/42 mmHg Patient Gender: F            HR:           81 bpm. Exam Location:  Inpatient Procedure: 2D Echo, Color Doppler, Cardiac Doppler and Saline Contrast Bubble            Study Indications:    R94.31 Abnormal EKG  History:        Patient has prior history of Echocardiogram examinations, most                 recent 02/15/2019. Pulmonary HTN; Risk Factors:Hypertension,                 Diabetes and Dyslipidemia. COVID+ 03/18/19.  Sonographer:    Raquel Sarna Senior RDCS Referring Phys:  [id number] MANUEL E IZQUIERDO  Sonographer Comments: Scanned supine on high flow positive pressure nasal cannula IMPRESSIONS  1. Left ventricular ejection fraction, by estimation, is 65 to 70%. The left ventricle has hyperdynamic function. The left ventricle has no regional wall motion abnormalities. Left ventricular diastolic parameters are consistent with Grade II diastolic dysfunction (pseudonormalization). Elevated left atrial pressure.  2. Right ventricular systolic function is normal. The right ventricular size is normal. There is moderately elevated pulmonary artery systolic pressure.  3. Left atrial size was severely dilated.  4. 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 The mean mitral valve gradient is 9.4 mmHg with average heart rate of 81 bpm.  5. The aortic valve has an indeterminant number of cusps. Aortic valve regurgitation is trivial. Severe aortic valve stenosis. Aortic valve area, by VTI measures 0.82 cm. Aortic valve mean gradient measures 42.0 mmHg. Aortic valve Vmax measures 4.32 m/s.  6. The inferior vena cava  is normal in size with greater than 50% respiratory variability, suggesting right atrial pressure of 3 mmHg.  7. Agitated saline contrast bubble study was negative, with no evidence of any interatrial shunt. Comparison(s): Prior images reviewed side by side. Changes from prior study are noted. Increased mitral gradients are partly explained by increased heart rate and cardiac output. FINDINGS  Left Ventricle: Left ventricular ejection fraction, by estimation, is 65 to 70%. The left ventricle has hyperdynamic function. The left ventricle has no regional wall motion abnormalities. The left ventricular internal cavity size was normal in size. There is no left ventricular hypertrophy. Left ventricular diastolic parameters are consistent with Grade II diastolic dysfunction (pseudonormalization). Elevated left atrial pressure. Right Ventricle: The right ventricular size is normal. No increase in right ventricular wall thickness. Right ventricular systolic function is normal. There is moderately elevated pulmonary artery systolic pressure. The tricuspid regurgitant velocity is 3.71 m/s, and with an assumed right atrial pressure of 3 mmHg, the estimated right ventricular systolic pressure is 79.1 mmHg. Left Atrium: Left atrial size was severely dilated. Right Atrium: Right atrial size was normal in size. Pericardium: There is no evidence of pericardial effusion. Mitral Valve: The mitral valve is rheumatic. There is moderate thickening of the mitral valve leaflet(s). Severe mitral annular calcification. Mild to moderate mitral valve regurgitation, with centrally-directed jet. Mild to moderate mitral valve stenosis. MV peak gradient, 25.8 mmHg. The mean mitral valve gradient is 9.4 mmHg with average heart rate of 81 bpm. Tricuspid Valve: The tricuspid valve is normal in structure. Tricuspid valve regurgitation is mild. Aortic Valve: The aortic valve has an indeterminant number of cusps. Aortic valve regurgitation is trivial.  Severe aortic stenosis is present. Aortic valve mean gradient measures 42.0 mmHg. Aortic valve peak gradient measures 74.6 mmHg. Aortic valve area, by VTI measures 0.82 cm. Pulmonic Valve: The pulmonic valve was grossly normal. Pulmonic valve regurgitation is not visualized. Aorta: The aortic root is normal in size and structure. Venous: The inferior vena cava is normal in size with greater than 50% respiratory variability, suggesting right atrial pressure of 3 mmHg. IAS/Shunts: No atrial level shunt detected by color flow Doppler. Agitated saline contrast was given intravenously to evaluate for intracardiac shunting. Agitated saline contrast bubble study was negative, with no evidence of any interatrial shunt.  LEFT VENTRICLE PLAX 2D LVIDd:         5.00 cm  Diastology LVIDs:         2.90 cm  LV e' lateral:  7.40 cm/s LV PW:         1.20 cm  LV E/e' lateral: 27.0 LV IVS:        1.00 cm  LV e' medial:    5.66 cm/s LVOT diam:     2.20 cm  LV E/e' medial:  35.3 LV SV:         88 LV SV Index:   47 LVOT Area:     3.80 cm  RIGHT VENTRICLE RV S prime:     10.80 cm/s TAPSE (M-mode): 2.2 cm LEFT ATRIUM             Index       RIGHT ATRIUM           Index LA diam:        4.90 cm 2.62 cm/m  RA Area:     21.30 cm LA Vol (A2C):   75.2 ml 40.20 ml/m RA Volume:   62.50 ml  33.41 ml/m LA Vol (A4C):   82.7 ml 44.21 ml/m LA Biplane Vol: 78.9 ml 42.18 ml/m  AORTIC VALVE AV Area (Vmax):    0.90 cm AV Area (Vmean):   0.84 cm AV Area (VTI):     0.82 cm AV Vmax:           432.00 cm/s AV Vmean:          309.000 cm/s AV VTI:            1.070 m AV Peak Grad:      74.6 mmHg AV Mean Grad:      42.0 mmHg LVOT Vmax:         102.00 cm/s LVOT Vmean:        68.200 cm/s LVOT VTI:          0.231 m LVOT/AV VTI ratio: 0.22  AORTA Ao Root diam: 2.70 cm Ao Asc diam:  2.10 cm MITRAL VALVE                TRICUSPID VALVE MV Area (PHT): 3.39 cm     TR Peak grad:   55.1 mmHg MV Peak grad:  25.8 mmHg    TR Vmax:        371.00 cm/s MV Mean grad:   9.4 mmHg MV Vmax:       2.54 m/s     SHUNTS MV Vmean:      140.0 cm/s   Systemic VTI:  0.23 m MV Decel Time: 224 msec     Systemic Diam: 2.20 cm MV E velocity: 200.00 cm/s MV A velocity: 130.00 cm/s MV E/A ratio:  1.54 Mihai Croitoru MD Electronically signed by Sanda Klein MD Signature Date/Time: 09/21/2019/12:09:35 PM    Final    VAS [place] LOWER EXTREMITY VENOUS (DVT)  Result Date: 09/21/2019  Lower Venous DVTStudy Indications: Hypoxia.  Risk Factors: None identified. Limitations: Poor ultrasound/tissue interface. Comparison Study: No prior studies. Performing Technologist: Oliver Hum RVT  Examination Guidelines: A complete evaluation includes B-mode imaging, spectral Doppler, color Doppler, and power Doppler as needed of all accessible portions of each vessel. Bilateral testing is considered an integral part of a complete examination. Limited examinations for reoccurring indications may be performed as noted. The reflux portion of the exam is performed with the patient in reverse Trendelenburg.  +---------+---------------+---------+-----------+----------+--------------+ RIGHT    CompressibilityPhasicitySpontaneityPropertiesThrombus Aging +---------+---------------+---------+-----------+----------+--------------+ CFV      Full           Yes      Yes                                 +---------+---------------+---------+-----------+----------+--------------+  SFJ      Full                                                        +---------+---------------+---------+-----------+----------+--------------+ FV Prox  Full                                                        +---------+---------------+---------+-----------+----------+--------------+ FV Mid   Full                                                        +---------+---------------+---------+-----------+----------+--------------+ FV DistalFull                                                         +---------+---------------+---------+-----------+----------+--------------+ PFV      Full                                                        +---------+---------------+---------+-----------+----------+--------------+ POP      Full           Yes      Yes                                 +---------+---------------+---------+-----------+----------+--------------+ PTV      Full                                                        +---------+---------------+---------+-----------+----------+--------------+ PERO     Full                                                        +---------+---------------+---------+-----------+----------+--------------+   +---------+---------------+---------+-----------+----------+--------------+ LEFT     CompressibilityPhasicitySpontaneityPropertiesThrombus Aging +---------+---------------+---------+-----------+----------+--------------+ CFV      Full           Yes      Yes                                 +---------+---------------+---------+-----------+----------+--------------+ SFJ      Full                                                        +---------+---------------+---------+-----------+----------+--------------+  FV Prox  Full                                                        +---------+---------------+---------+-----------+----------+--------------+ FV Mid   Full                                                        +---------+---------------+---------+-----------+----------+--------------+ FV DistalFull                                                        +---------+---------------+---------+-----------+----------+--------------+ PFV      Full                                                        +---------+---------------+---------+-----------+----------+--------------+ POP      Full           Yes      Yes                                  +---------+---------------+---------+-----------+----------+--------------+ PTV      Full                                                        +---------+---------------+---------+-----------+----------+--------------+ PERO     Full                                                        +---------+---------------+---------+-----------+----------+--------------+     Summary: RIGHT: - There is no evidence of deep vein thrombosis in the lower extremity.  - No cystic structure found in the popliteal fossa.  LEFT: - There is no evidence of deep vein thrombosis in the lower extremity.  - No cystic structure found in the popliteal fossa.  *See table(s) above for measurements and observations. Electronically signed by Harold Barban MD on 09/21/2019 at 5:49:31 PM.    Final     Microbiology: No results found for this or any previous visit (from the past 240 hour(s)).   Labs: Basic Metabolic Panel: Recent Labs  Lab 09/28/19 0424 09/30/19 0427 10/01/19 0423 10/02/19 0333 10/03/19 0326  NA 143 140 137 135 134*  K 3.9 4.3 4.4 5.1 4.4  CL 111 110 103 99 99  CO2 _0 GLUCOSE 73 127* 128* 150* 163*  BUN 63* 61* 69* 67* 69*  CREATININE 2.14* 2.21* 2.00* 1.79* 1.87*  CALCIUM 9.2 8.9 9.2 9.2 9.1  MG 1.6*  --   --   --   --  PHOS 3.3  --   --   --   --    Liver Function Tests: No results for input(s): AST, ALT, ALKPHOS, BILITOT, PROT, ALBUMIN in the last 168 hours. No results for input(s): LIPASE, AMYLASE in the last 168 hours. No results for input(s): AMMONIA in the last 168 hours. CBC: Recent Labs  Lab 09/28/19 0424 09/30/19 0427 10/01/19 0423 10/02/19 0333 10/03/19 0326  WBC 10.6* 12.8* 9.3 9.5 8.8  HGB 7.5* 8.3* 8.3* 9.3* 8.8*  HCT 24.2* 25.8* 26.1* 28.9* 26.9*  MCV 93.8 91.5 90.3 91.2 90.6  PLT 257 219 237 273 301   Cardiac Enzymes: No results for input(s): CKTOTAL, CKMB, CKMBINDEX, TROPONINI in the last 168 hours. BNP: BNP (last 3 results) Recent Labs     09/14/19 0736 09/20/19 1823  BNP 366.0* 548.2*    ProBNP (last 3 results) No results for input(s): PROBNP in the last 8760 hours.  CBG: Recent Labs  Lab 10/02/19 0804 10/02/19 1131 10/02/19 1712 10/02/19 2041 10/03/19 0754  GLUCAP 157* 299* 291* 291* 161*       Signed:  Domenic Polite MD.  [facility] 10/04/2019, 2:44 PM

## 2019-10-04 NOTE — Telephone Encounter (Signed)
Transition Care Management Follow-up Telephone Call  Date of discharge and from where 10/03/19 [place]  How have you been since you were released from the hospital? shes been ok  Any questions or concerns? None   Items Reviewed:  Did the pt receive and understand the discharge instructions provided? yes  Medications obtained and verified? yes  Any new allergies since your discharge? no  Dietary orders reviewed? no  Do you have support at home? yes  Other (ie: DME, Home Health, etc) none  Functional Questionnaire: (I = Independent and D = Dependent) ADL's: I  Bathing/Dressing- D   Meal Prep- D  Eating- I  Maintaining continence- I  Transferring/Ambulation- I  Managing Meds- I   Follow up appointments reviewed:    PCP Hospital f/u appt confirmed? Scheduled to see Patricia Brine DNP,FNP-Bc  on 10/05/19 @ 4:30pm  Specialist Hospital f/u appt confirmed?  Scheduled to see heart dr  On 10/12/19  @ 3:30pm and waiting on the kidney dr to make an appointment with them   Are transportation arrangements needed? none  If their condition worsens, is the pt aware to call  their PCP or go to the ED? Yes   Was the patient provided with contact information for the PCP's office or ED? yes Was the pt encouraged to call back with questions or concerns? yes

## 2019-10-05 ENCOUNTER — Telehealth (INDEPENDENT_AMBULATORY_CARE_PROVIDER_SITE_OTHER): Payer: PPO | Admitting: Nurse Practitioner

## 2019-10-05 ENCOUNTER — Ambulatory Visit (HOSPITAL_COMMUNITY): Payer: PPO

## 2019-10-05 ENCOUNTER — Other Ambulatory Visit: Payer: Self-pay

## 2019-10-05 ENCOUNTER — Encounter: Payer: Self-pay | Admitting: Nurse Practitioner

## 2019-10-05 ENCOUNTER — Telehealth (HOSPITAL_COMMUNITY): Payer: Self-pay | Admitting: *Deleted

## 2019-10-05 VITALS — BP 184/61 | HR 66 | Wt 175.0 lb

## 2019-10-05 DIAGNOSIS — N184 Chronic kidney disease, stage 4 (severe): Secondary | ICD-10-CM | POA: Diagnosis not present

## 2019-10-05 DIAGNOSIS — E119 Type 2 diabetes mellitus without complications: Secondary | ICD-10-CM

## 2019-10-05 DIAGNOSIS — E1122 Type 2 diabetes mellitus with diabetic chronic kidney disease: Secondary | ICD-10-CM

## 2019-10-05 DIAGNOSIS — Z09 Encounter for follow-up examination after completed treatment for conditions other than malignant neoplasm: Secondary | ICD-10-CM | POA: Diagnosis not present

## 2019-10-05 DIAGNOSIS — J189 Pneumonia, unspecified organism: Secondary | ICD-10-CM

## 2019-10-05 DIAGNOSIS — J9600 Acute respiratory failure, unspecified whether with hypoxia or hypercapnia: Secondary | ICD-10-CM | POA: Diagnosis not present

## 2019-10-05 MED ORDER — LINAGLIPTIN 5 MG PO TABS: 5.0000 mg | ORAL_TABLET | Freq: Every day | ORAL | 1 refills | Status: DC

## 2019-10-05 NOTE — Progress Notes (Signed)
Virtual Visit via Telephone   This visit type was conducted due to national recommendations for restrictions regarding the COVID-19 Pandemic (e.g. social distancing) in an effort to limit this patient's exposure and mitigate transmission in our community.  Due to her co-morbid illnesses, this patient is at least at moderate risk for complications without adequate follow up.  This format is felt to be most appropriate for this patient at this time.  All issues noted in this document were discussed and addressed.  A limited physical exam was performed with this format.    This visit type was conducted due to national recommendations for restrictions regarding the COVID-19 Pandemic (e.g. social distancing) in an effort to limit this patient's exposure and mitigate transmission in our community.  Patients identity confirmed using two different identifiers.  This format is felt to be most appropriate for this patient at this time.  All issues noted in this document were discussed and addressed.  No physical exam was performed (except for noted visual exam findings with Video Visits).    Date:  10/24/2019   ID:  Patricia Mata, DOB 10/26/1955, MRN [id number]  Patient Location:  Spoke with Teressa Senter  Provider location:   Office    Chief Complaint:  Follow up hospital admission  History of Present Illness:    Patricia Mata is a 64 y.o. female who presents via video conferencing for a telehealth visit today.    The patient does not have symptoms concerning for COVID-19 infection (fever, chills, cough, or new shortness of breath).   Virtual visit for hospital follow up for pneumonia 3/8 - 3/21 admitted with acute hypoxic respiratory failure secondary to pneumonia.- this had worsened from her previous admission this time she had required significant oxygen supplementation, subsequently she was intubated on 3/9 and extubated 3-13. She was followed by cardiology this admission, treated  with diuretics, she was advised to not take the HCTZ until she sees the cardiologist on Tuesday March 30th.   She has not had home health set up yet She is able to get dressed as long she takes her time.  Blood sugar is 153 this morning. Had been as high as 290.  She is taking     Past Medical History:  Diagnosis Date  . Acute kidney injury (nontraumatic) ([place])   . Acute renal failure (ARF) ([place]) 06/08/2018  . Cancer [facility]) 2008   Colon   . Diabetes mellitus without complication ([place])   . Gout 09/08/2018  . Hypertension   . Macrocytic anemia    Past Surgical History:  Procedure Laterality Date  . COLON SURGERY    . DEBRIDMENT OF DECUBITUS ULCER N/A 06/12/2018   Procedure: DEBRIDMENT OF DECUBITUS ULCER;  Surgeon: Wallace Going, DO;  Location: [facility];  Service: Plastics;  Laterality: N/A;  . IR FLUORO GUIDE PORT INSERTION RIGHT  04/02/2017  . IR REMOVAL TUN ACCESS W/ PORT W/O FL MOD SED  03/16/2019  . IR US GUIDE VASC ACCESS RIGHT  04/02/2017  . RIGHT HEART CATH N/A 04/23/2019   Procedure: RIGHT HEART CATH;  Surgeon: Jolaine Artist, MD;  Location: [place] [facility];  Service: Cardiovascular;  Laterality: N/A;  . RIGHT/LEFT HEART CATH AND CORONARY ANGIOGRAPHY N/A 05/21/2018   Procedure: RIGHT/LEFT HEART CATH AND CORONARY ANGIOGRAPHY;  Surgeon: Nelva Bush, MD;  Location: [place] [facility];  Service: Cardiovascular;  Laterality: N/A;     Current Meds  Medication Sig  . allopurinol (ZYLOPRIM) 100 MG tablet  Take 100 mg by mouth daily.  Marland Kitchen amiodarone (PACERONE) 200 MG tablet TAKE 1 TABLET BY MOUTH EVERY DAY (Patient taking differently: Take 200 mg by mouth daily. )  . amLODipine (NORVASC) 10 MG tablet Take 1 tablet (10 mg total) by mouth daily.  Marland Kitchen atorvastatin (LIPITOR) 40 MG tablet TAKE 1 TABLET BY MOUTH EVERY DAY (Patient taking differently: Take 40 mg by mouth daily at 6 PM. )  . blood glucose meter kit and supplies Dispense based on patient and insurance preference. Use up  to four times daily as directed. (FOR ICD-10 E10.9, E11.9).  Marland Kitchen Blood Glucose Monitoring Suppl (ONETOUCH ULTRALINK) w/Device KIT Check blood sugars twice daily E11.9  . colchicine 0.6 MG tablet TAKE 1 TABLET BY MOUTH TWICE A DAY AS NEEDED FOR GOUT (Patient taking differently: Take 0.6 mg by mouth daily. )  . doxazosin (CARDURA) 1 MG tablet Take 1 tablet (1 mg total) by mouth at bedtime.  . furosemide (LASIX) 40 MG tablet Take 1 tablet (40 mg total) by mouth daily.  Marland Kitchen glucose blood test strip Use as instructed  . Insulin Pen Needle 29G X 5MM MISC Use as directed  . JANUVIA 50 MG tablet TAKE 1 TABLET BY MOUTH EVERY DAY (Patient not taking: No sig reported)  . Lancets (ONETOUCH ULTRASOFT) lancets Use as instructed to check blood sugars twice daily E11.9  . latanoprost (XALATAN) 0.005 % ophthalmic solution Place 1 drop into both eyes at bedtime.   . Multiple Vitamins-Minerals (CENTRUM SILVER 50+WOMEN) TABS Take 1 tablet by mouth daily.  Marland Kitchen oxymetazoline (AFRIN) 0.05 % nasal spray Place 1 spray into both nostrils 2 (two) times daily as needed for congestion.  . sildenafil (REVATIO) 20 MG tablet Take 1 tablet (20 mg total) by mouth 3 (three) times daily.  . [DISCONTINUED] insulin glargine (LANTUS) 100 unit/mL SOPN Inject 0.15 mLs (15 Units total) into the skin daily. (Patient taking differently: Inject 5 Units into the skin 2 (two) times daily as needed (blood sugar over 150). )  . [DISCONTINUED] levothyroxine (SYNTHROID) 88 MCG tablet Take 1 tablet (88 mcg total) by mouth daily.     Allergies:   Ancef [cefazolin], Lactose intolerance (gi), and Sulfa antibiotics   Social History   Tobacco Use  . Smoking status: Former Smoker    Packs/day: 0.25    Years: 20.00    Pack years: 5.00    Types: Cigarettes    Quit date: 01/31/2001    Years since quitting: 18.7  . Smokeless tobacco: Former Network engineer Use Topics  . Alcohol use: Yes    Alcohol/week: 2.0 standard drinks    Types: 2 Shots of liquor  per week  . Drug use: Never     Family Hx: The patient's family history includes Cervical cancer in her mother; Diabetes in her mother; HIV/AIDS in her brother; Heart attack in her father; Hypertension in her brother, mother, sister, sister, and sister; Prostate cancer in her brother.  ROS:   Please see the history of present illness.    Review of Systems  Constitutional: Negative.   Respiratory: Negative.  Negative for cough, shortness of breath and wheezing.   Cardiovascular: Negative.   Musculoskeletal: Negative.   Neurological: Negative for dizziness and tingling.    All other systems reviewed and are negative.   Labs/Other Tests and Data Reviewed:    Recent Labs: 09/20/2019: B Natriuretic Peptide 548.2 09/21/2019: TSH 2.435 09/28/2019: Magnesium 1.6 10/19/2019: ALT 7; BUN 48; Creatinine 1.90; Hemoglobin 7.9; Platelet Count 233;  Potassium 4.9; Sodium 138   Recent Lipid Panel Lab Results  Component Value Date/Time   CHOL 143 03/20/2019 04:06 AM   TRIG 108 09/21/2019 11:36 PM   HDL 48 03/20/2019 04:06 AM   CHOLHDL 3.0 03/20/2019 04:06 AM   LDLCALC 86 03/20/2019 04:06 AM    Wt Readings from Last 3 Encounters:  10/12/19 177 lb 9.6 oz (80.6 kg)  10/05/19 175 lb (79.4 kg)  10/03/19 172 lb 2.9 oz (78.1 kg)     Exam:    Vital Signs:  BP (!) 184/61 (BP Location: Left Arm, Patient Position: Sitting, Cuff Size: Small)   Pulse 66   Wt 175 lb (79.4 kg)   BMI 30.04 kg/m     Physical Exam  Constitutional: She is oriented to person, place, and time and well-developed, well-nourished, and in no distress. No distress.  Pulmonary/Chest: Effort normal. No respiratory distress.  Neurological: She is alert and oriented to person, place, and time.  Psychiatric: Mood, memory, affect and judgment normal.    ASSESSMENT & PLAN:    1. Community acquired pneumonia, unspecified laterality Admission from 3/9-3/21 for worsening pneumonia and respiratory failure. She is now at home and  doing much better.  TCM Performed. A member of the clinical team spoke with the patient upon dischare. Discharge summary was reviewed in full detail during the visit. Meds reconciled and compared to discharge meds. Medication list is updated and reviewed with the patient.  Greater than 50% face to face time was spent in counseling an coordination of care.  All questions were answered to the satisfaction of the patient.   - Ambulatory referral to Home Health  2. Type 2 diabetes mellitus without complication, without long-term current use of insulin (HCC)  I will switch her to tradjenta due to her worsening kidney functions - linagliptin (TRADJENTA) 5 MG TABS tablet; Take 1 tablet (5 mg total) by mouth daily.  Dispense: 90 tablet; Refill: 1  3. Stage 4 chronic kidney disease ([place])  I will refer her to nephrology for further evaluation  COVID-19 Education: The signs and symptoms of COVID-19 were discussed with the patient and how to seek care for testing (follow up with PCP or arrange E-visit).  The importance of social distancing was discussed today.  Patient Risk:   After full review of this patients clinical status, I feel that they are at least moderate risk at this time.  Time:   Today, I have spent 15 minutes/ seconds with the patient with telehealth technology discussing above diagnoses.     Medication Adjustments/Labs and Tests Ordered: Current medicines are reviewed at length with the patient today.  Concerns regarding medicines are outlined above.   Tests Ordered: Orders Placed This Encounter  Procedures  . Ambulatory referral to Home Health  . Ambulatory referral to Nephrology    Medication Changes: Meds ordered this encounter  Medications  . linagliptin (TRADJENTA) 5 MG TABS tablet    Sig: Take 1 tablet (5 mg total) by mouth daily.    Dispense:  90 tablet    Refill:  1    Disposition:  Follow up prn  Signed, [name], FNP

## 2019-10-05 NOTE — Telephone Encounter (Signed)
Called to check on Patricia Mata, she was in pulmonary rehab before her last 2 admissions for pneumonia requiring intubation.  She is being followed by physical therapy after discharge from the hospital and she has been discharged from pulmonary rehab.  She is in agreement.

## 2019-10-06 ENCOUNTER — Ambulatory Visit: Payer: PPO

## 2019-10-06 ENCOUNTER — Other Ambulatory Visit: Payer: Self-pay | Admitting: Nurse Practitioner

## 2019-10-06 DIAGNOSIS — E119 Type 2 diabetes mellitus without complications: Secondary | ICD-10-CM

## 2019-10-07 ENCOUNTER — Ambulatory Visit (HOSPITAL_COMMUNITY): Payer: PPO

## 2019-10-12 ENCOUNTER — Ambulatory Visit (HOSPITAL_COMMUNITY)
Admit: 2019-10-12 | Discharge: 2019-10-12 | Disposition: A | Discharge status: Home / Self Care | Payer: PPO | Source: Ambulatory Visit | Attending: Cardiology | Admitting: Cardiology

## 2019-10-12 ENCOUNTER — Other Ambulatory Visit: Payer: Self-pay

## 2019-10-12 ENCOUNTER — Encounter (HOSPITAL_COMMUNITY): Payer: Self-pay

## 2019-10-12 ENCOUNTER — Ambulatory Visit (HOSPITAL_COMMUNITY): Payer: PPO

## 2019-10-12 VITALS — BP 148/62 | HR 62 | Wt 177.6 lb

## 2019-10-12 DIAGNOSIS — Z833 Family history of diabetes mellitus: Secondary | ICD-10-CM | POA: Diagnosis not present

## 2019-10-12 DIAGNOSIS — I272 Pulmonary hypertension, unspecified: Secondary | ICD-10-CM | POA: Insufficient documentation

## 2019-10-12 DIAGNOSIS — Z881 Allergy status to other antibiotic agents status: Secondary | ICD-10-CM | POA: Diagnosis not present

## 2019-10-12 DIAGNOSIS — Z85038 Personal history of other malignant neoplasm of large intestine: Secondary | ICD-10-CM | POA: Insufficient documentation

## 2019-10-12 DIAGNOSIS — Z8042 Family history of malignant neoplasm of prostate: Secondary | ICD-10-CM | POA: Diagnosis not present

## 2019-10-12 DIAGNOSIS — N184 Chronic kidney disease, stage 4 (severe): Secondary | ICD-10-CM | POA: Diagnosis not present

## 2019-10-12 DIAGNOSIS — Z79899 Other long term (current) drug therapy: Secondary | ICD-10-CM | POA: Diagnosis not present

## 2019-10-12 DIAGNOSIS — I5032 Chronic diastolic (congestive) heart failure: Secondary | ICD-10-CM | POA: Diagnosis not present

## 2019-10-12 DIAGNOSIS — I130 Hypertensive heart and chronic kidney disease with heart failure and stage 1 through stage 4 chronic kidney disease, or unspecified chronic kidney disease: Secondary | ICD-10-CM | POA: Insufficient documentation

## 2019-10-12 DIAGNOSIS — Z8739 Personal history of other diseases of the musculoskeletal system and connective tissue: Secondary | ICD-10-CM | POA: Insufficient documentation

## 2019-10-12 DIAGNOSIS — Z8249 Family history of ischemic heart disease and other diseases of the circulatory system: Secondary | ICD-10-CM | POA: Insufficient documentation

## 2019-10-12 DIAGNOSIS — M109 Gout, unspecified: Secondary | ICD-10-CM | POA: Insufficient documentation

## 2019-10-12 DIAGNOSIS — E1122 Type 2 diabetes mellitus with diabetic chronic kidney disease: Secondary | ICD-10-CM | POA: Insufficient documentation

## 2019-10-12 DIAGNOSIS — I480 Paroxysmal atrial fibrillation: Secondary | ICD-10-CM | POA: Diagnosis not present

## 2019-10-12 DIAGNOSIS — Z87891 Personal history of nicotine dependence: Secondary | ICD-10-CM | POA: Insufficient documentation

## 2019-10-12 DIAGNOSIS — Z79890 Hormone replacement therapy (postmenopausal): Secondary | ICD-10-CM | POA: Diagnosis not present

## 2019-10-12 DIAGNOSIS — Z8049 Family history of malignant neoplasm of other genital organs: Secondary | ICD-10-CM | POA: Insufficient documentation

## 2019-10-12 DIAGNOSIS — Z882 Allergy status to sulfonamides status: Secondary | ICD-10-CM | POA: Insufficient documentation

## 2019-10-12 DIAGNOSIS — Z794 Long term (current) use of insulin: Secondary | ICD-10-CM | POA: Insufficient documentation

## 2019-10-12 LAB — BASIC METABOLIC PANEL
Anion gap: 9 (ref 5–15)
BUN: 52 mg/dL — ABNORMAL HIGH (ref 8–23)
CO2: 25 mmol/L (ref 22–32)
Calcium: 9.1 mg/dL (ref 8.9–10.3)
Chloride: 103 mmol/L (ref 98–111)
Creatinine, Ser: 2.4 mg/dL — ABNORMAL HIGH (ref 0.44–1.00)
GFR calc Af Amer: 24 mL/min — ABNORMAL LOW (ref >60)
GFR calc non Af Amer: 21 mL/min — ABNORMAL LOW (ref >60)
Glucose, Bld: 165 mg/dL — ABNORMAL HIGH (ref 70–99)
Potassium: 4.6 mmol/L (ref 3.5–5.1)
Sodium: 137 mmol/L (ref 135–145)

## 2019-10-12 NOTE — Patient Instructions (Signed)
It was great to see you today! No medication changes are needed at this time.  Labs today We will only contact you if something comes back abnormal or we need to make some changes. Otherwise no news is good news!  Your physician recommends that you schedule a follow-up appointment in: 2-3 months with Dr Haroldine Laws  Do the following things EVERYDAY: 1) Weigh yourself in the morning before breakfast. Write it down and keep it in a log. 2) Take your medicines as prescribed 3) Eat low salt foods--Limit salt (sodium) to 2000 mg per day.  4) Stay as active as you can everyday 5) Limit all fluids for the day to less than 2 liters  At the [facility], you and your health needs are our priority. As part of our continuing mission to provide you with exceptional heart care, we have created designated Provider Care Teams. These Care Teams include your primary Cardiologist (physician) and Advanced Practice Providers (APPs- Physician Assistants and Nurse Practitioners) who all work together to provide you with the care you need, when you need it.   You may see any of the following providers on your designated Care Team at your next follow up: Marland Kitchen Dr Glori Bickers . Dr Loralie Champagne . Darrick Grinder, NP . Lyda Jester, PA . Audry Riles, PharmD   Please be sure to bring in all your medications bottles to every appointment.

## 2019-10-12 NOTE — Progress Notes (Signed)
Advanced Heart Failure Clinic Note   Referring Physician: PCP: [name], FNP PCP-Cardiologist: Quay Burow, MD  Hematologist: Dr. Alen Blew AHF: Dr. Haroldine Laws   HPI:  Patricia Mata is a 64 y.o. 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 Followed by Dr Gwenlyn Found for severe AS and pulmonary HTN.  Admitted on 05/10/18  with AF with RVR and recurrent anemia. Developed respiratory failure and intubated. She was transfused and placed on IV amio. Subsequently developed AKI/ESRD and placed on CVVHD. Converted to NSR on amio and weaned off the vent.  Echoshowed normal LVEF 65-70% with moderate AS and moderate MS and severe PH with RVSP 62mHG. She is not a candidate for intervention of AS or MS with advanced PAH and transfusion-dependent myelodysplasia as a result of her colon CA   [facility] and severely elevated PA pressures.-> V/Q scan was negative. She was started on sildenafil and tolerated well. She diuresed with IV lasix and transitioned to lasix 40 mg PO BID. She did not require O2 at discharge. Palliative care was consulted and decision was made to transition to DNR.  She follows with Dr. SAlen Blewand requires transfusions every 2-3 weeks as an outpatient. She also developed a buttock wound. WOC was consulted and recommended hydrotherapy for coccygeal osteo   [facility]   [place] showed mild PAH in setting of high-output (likely due to anemia). See data below  She was recently admitted for CAP 3/2-3/5 and treated w/ abx. Returned to hospital 3 days later on 3/8 for worsening SOB and admitted for acute hypoxic respiratory failure requiring intubation. Chest CT showed multifocal PNA COVID negative.. A/c CHF also felt to be contributing.  General cardiology was consulted and helped to manage her diuretics. Improved clinically w/ IV abx and IV lasix and successfully extubated. Was transitioned back to PO lasix 40 mg daily.   Presents back to clinic today. Feels remarkably better. Denies resting dyspnea. No supp O2 requirements. No dyspnea w/ ADLs but gets SOB w/ moderate physical activity. Denies wt gain since discharge. No LEE. No chest pain, syncope/ near syncope.     Cardiac studies: RHC 10/20 RA = 8 RV = 53/3 PA = 57/12 (32) PCW = 14 (v waves to 32) Fick cardiac output/index = 8.4/4.5 PVR = 2.1 WU Ao sat = 99% PA sat = 73%, 74% High SVC sat = 74%   RHC 11/19 RA  14 RV  110/15 PA 105/40 (62) PCWP 25 Ao sat 91% PA sat 53% Fick CO/CI  6.8/4.3  PVR 5.4  AoV  Mean 226mG AVA 1.3 cm2   PFTs 8/20 FEV1 2.24 (112%) FVC 2.63 (103%) DLCO 56%  Echo 02/15/19 EF 65% RV normal size/function. Septal flattening. Severe AS (mean 4330m) RVSP 52m90m     Review of systems complete and found to be negative unless listed in HPI.    Past Medical History:  Diagnosis Date  . Acute kidney injury (nontraumatic) (HCC)[place]. Acute renal failure (ARF) (HCC)[place]/25/2019  [facility]   [facility]   . Diabetes mellitus without complication (HCC)[place]. Gout 09/08/2018  . Hypertension   . Macrocytic anemia     Current Outpatient Medications  Medication Sig Dispense Refill  . allopurinol (ZYLOPRIM) 100 MG tablet Take 100 mg by mouth daily.    .Marland Kitchen  amiodarone (PACERONE) 200 MG tablet TAKE 1 TABLET BY MOUTH EVERY DAY (Patient taking differently: Take 200 mg by mouth daily. ) 90 tablet 1  . amLODipine (NORVASC) 10 MG tablet Take 1 tablet (10 mg total) by mouth daily. 90 tablet 3  . atorvastatin (LIPITOR) 40 MG tablet TAKE 1 TABLET BY MOUTH EVERY DAY (Patient taking differently: Take 40 mg by mouth daily at 6 PM. ) 90 tablet 0  . blood glucose meter kit and supplies Dispense based on patient and insurance preference. Use up to four  times daily as directed. (FOR ICD-10 E10.9, E11.9). 1 each 3  . Blood Glucose Monitoring Suppl (ONETOUCH ULTRALINK) w/Device KIT Check blood sugars twice daily E11.9 1 kit 0  . colchicine 0.6 MG tablet TAKE 1 TABLET BY MOUTH TWICE A DAY AS NEEDED FOR GOUT (Patient taking differently: Take 0.6 mg by mouth daily. ) 180 tablet 1  . doxazosin (CARDURA) 1 MG tablet Take 1 tablet (1 mg total) by mouth at bedtime. 30 tablet 6  . furosemide (LASIX) 40 MG tablet Take 1 tablet (40 mg total) by mouth daily.    Marland Kitchen glucose blood test strip Use as instructed 100 each 12  . Insulin Pen Needle 29G X 5MM MISC Use as directed 200 each 0  . Lancets (ONETOUCH ULTRASOFT) lancets Use as instructed to check blood sugars twice daily E11.9 100 each 12  . latanoprost (XALATAN) 0.005 % ophthalmic solution Place 1 drop into both eyes at bedtime.     Marland Kitchen levothyroxine (SYNTHROID) 88 MCG tablet Take 1 tablet (88 mcg total) by mouth daily. 30 tablet 2  . linagliptin (TRADJENTA) 5 MG TABS tablet Take 1 tablet (5 mg total) by mouth daily. 90 tablet 1  . Multiple Vitamins-Minerals (CENTRUM SILVER 50+WOMEN) TABS Take 1 tablet by mouth daily.    Marland Kitchen oxymetazoline (AFRIN) 0.05 % nasal spray Place 1 spray into both nostrils 2 (two) times daily as needed for congestion.    . sildenafil (REVATIO) 20 MG tablet Take 1 tablet (20 mg total) by mouth 3 (three) times daily. 270 tablet 3  . JANUVIA 50 MG tablet TAKE 1 TABLET BY MOUTH EVERY DAY (Patient not taking: No sig reported) 90 tablet 0   No current facility-administered medications for this encounter.    Allergies  Allergen Reactions  . Ancef [Cefazolin] Itching    Severe itching- after procedure, ancef was the antibiotic.-04/02/17 Tolerates penicillins  . Lactose Intolerance (Gi) Diarrhea  . Sulfa Antibiotics Rash and Other (See Comments)    Blisters, also      Social History   Socioeconomic History  . Marital status: Married    Spouse name: Not on file  . Number of children:  Not on file  . Years of education: Not on file  . Highest education level: Not on file  Occupational History  . Not on file  Tobacco Use  . Smoking status: Former Smoker    Packs/day: 0.25    Years: 20.00    Pack years: 5.00    Types: Cigarettes    Quit date: 01/31/2001    Years since quitting: 18.7  . Smokeless tobacco: Former Network engineer and Sexual Activity  . Alcohol use: Yes    Alcohol/week: 2.0 standard drinks    Types: 2 Shots of liquor per week  . Drug use: Never  . Sexual activity: Not on file  Other Topics Concern  . Not on file  Social History Narrative  . Not on file  Social Determinants of Health   Financial Resource Strain:   . Difficulty of Paying Living Expenses:   Food Insecurity:   . Worried About Charity fundraiser in the Last Year:   . Arboriculturist in the Last Year:   Transportation Needs:   . Film/video editor (Medical):   Marland Kitchen Lack of Transportation (Non-Medical):   Physical Activity:   . Days of Exercise per Week:   . Minutes of Exercise per Session:   Stress:   . Feeling of Stress :   Social Connections:   . Frequency of Communication with Friends and Family:   . Frequency of Social Gatherings with Friends and Family:   . Attends Religious Services:   . Active Member of Clubs or Organizations:   . Attends Archivist Meetings:   Marland Kitchen Marital Status:   Intimate Partner Violence:   . Fear of Current or Ex-Partner:   . Emotionally Abused:   Marland Kitchen Physically Abused:   . Sexually Abused:       Family History  Problem Relation Age of Onset  . Hypertension Mother   . Diabetes Mother   . Cervical cancer Mother   . Heart attack Father   . Hypertension Sister   . Hypertension Brother   . Hypertension Sister   . Hypertension Sister   . Prostate cancer Brother   . HIV/AIDS Brother    Vitals:   10/12/19 1525  BP: (!) 148/62  Pulse: 62  SpO2: 100%  Weight: 80.6 kg    Wt Readings from Last 3 Encounters:  10/12/19 80.6 kg    10/05/19 79.4 kg  10/03/19 78.1 kg    PHYSICAL EXAM: General:  Well appearing. No respiratory difficulty HEENT: normal Neck: supple. no JVD. Carotids 2+ bilat; no bruits. No lymphadenopathy or thyromegaly appreciated. Cor: PMI nondisplaced. Regular rate & rhythm. 3/6 systolic murmur heard throughout the precordium, loudest at RUSB and radiating to carotids  Lungs: clear Abdomen: soft, nontender, nondistended. No hepatosplenomegaly. No bruits or masses. Good bowel sounds. Extremities: no cyanosis, clubbing, rash, edema Neuro: alert & oriented x 3, cranial nerves grossly intact. moves all 4 extremities w/o difficulty. Affect pleasant.   ASSESSMENT & PLAN:  1. PAH - Echo 05/12/18 LVEF 65-70%, Grade 2 DD, Mod AS, Mild/Mod MS, Mod TR, Trivial PI, PA peak pressure 93.  - L/RHC 05/21/18 with no significant CAD, severe pulmonary HTN with PA 105/40 (62) PCW 33 PVR 5.4 WU - Suspect multifactorial in nature suspect including high output due to anemia, WHO group II (diastolic HF and valvular disease) and III (OSA) but given degree of PH cannot exclude WHO Group I component. - Repeat RHC 04/23/19 with much improved pulmonary pressures. PA 57/12 (32) PCWP 14 (v to 32) - Echo 8/20 EF 65% with normal RV size and function. + septal flattening. RVSP 48mHG - VQ negative - PFTs 8/20 with normal spirometry and moderately reduced DLCO - Sleep study previously ordered but not completed. Will reorder. - Continue sildenafil 20 mg TID cautiously (has AS).  - Recent admit 09/2019 for acute hypoxic respiratory failure 2/2 a/c CHF and PNA. Improved w/ IV diuretics  - Now back on PO diuretics, Lasix 40 mg qd and much improved  - Overall stable NYHA II- III. Euvolemic on exam - Continue current regimen. No change today. Check BMP given daily diuretic and CKD - Discussed importance of daily wts and low sodium diet   2. Moderate- Severe AS/MS - Echos 8/20 and again 3/21  with severe AS but on exam sounds more  moderate c/w previous cath.  - denies angina, dyspnea, syncope/near syncope - She has seen Dr. Alen Blew and he has made it clear that she has a terminal illness but he would have no issue with using Plavix if needed - That said, we do not believe AS is her primary issue here and with CKD 4 and severe PAH, likely not a TAVR candidate  3. PAF - Maintaining NSR on amiodarone - Continue amiodarone 200 daily. Reviewed labs. TSH and HFT recently checked 3/21 and normal - Continue Toprol XL 25 mg daily - Continue to avoid anticoagulation with transfusion dependent anemia. (chronic).  - CHA2DS2/VASc is at least 4.   4. CKD 4 -Baseline SCr 2.2-2.7  -Check BMP today   5. Transfusion dependent anemia in setting of Myelodysplastic syndrome -Transfuse as needed per [place].   6. HTN - BP mildly elevated. Hydralazine was discontinued at recent hospitalization due to low BP  - given severity of AS, will not resume, in an effort to prevent hypotension  - continue doxazosin 1 mg - Continue amlodipine to 10 mg daily.  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  - Resolved  F/u in 8 weeks, or sooner if needed   Lyda Jester, PA-C 10/12/19

## 2019-10-12 NOTE — Progress Notes (Signed)
Patient Name: Gerard Bonus        DOB: 07-16-1955      Height: 5 4"    Weight: 177  Office Name:[facility]         Referring Provider:Brittainy Hector, [place]  Today's Date: 10/12/2019   STOP BANG RISK ASSESSMENT S (snore) Have you been told that you snore?     YES   T (tired) Are you often tired, fatigued, or sleepy during the day?   YES  O (obstruction) Do you stop breathing, choke, or gasp during sleep? NO   P (pressure) Do you have or are you being treated for high blood pressure? YES   B (BMI) Is your body index greater than 35 kg/m? NO   A (age) Are you 79 years old or older? YES   N (neck) Do you have a neck circumference greater than 16 inches?   NO   G (gender) Are you a female? NO   TOTAL STOP/BANG "YES" ANSWERS                                                                        For Office Use Only              Procedure Order Form    YES to 3+ Stop Bang questions OR two clinical symptoms - patient qualifies for WatchPAT (CPT 95800)     Submit: This Form + Patient Face Sheet + Clinical Note via CloudPAT or Fax: [phone]         Clinical Notes: Will consult Sleep Specialist and refer for management of therapy due to patient increased risk of Sleep Apnea. Ordering a sleep study due to the following two clinical symptoms: Excessive daytime sleepiness G47.10 /N52.9 / History of high blood pressure R03.0   I understand that I am proceeding with a home sleep apnea test as ordered by my treating physician. I understand that untreated sleep apnea is a serious cardiovascular risk factor and it is my responsibility to perform the test and seek management for sleep apnea. I will be contacted with the results and be managed for sleep apnea by a local sleep physician. I will be receiving equipment and further instructions from [facility]. I shall promptly ship back the equipment via the included mailing label. I understand my insurance will be billed for  the test and as the patient I am responsible for any insurance related out-of-pocket costs incurred. I have been provided with written instructions and can call for additional video or telephonic instruction, with 24-hour availability of qualified personnel to answer any questions: Patient Help Desk [phone].  Patient Signature ______________________________________________________   Date______________________ Patient Telemedicine Verbal Consent

## 2019-10-13 ENCOUNTER — Telehealth (HOSPITAL_COMMUNITY): Payer: Self-pay | Admitting: Cardiology

## 2019-10-13 NOTE — Telephone Encounter (Signed)
Order, OV note, stop bang and demographics all faxed to [facility] at [phone]  

## 2019-10-14 ENCOUNTER — Ambulatory Visit (HOSPITAL_COMMUNITY): Payer: PPO

## 2019-10-15 ENCOUNTER — Other Ambulatory Visit: Payer: Self-pay | Admitting: Nurse Practitioner

## 2019-10-19 ENCOUNTER — Other Ambulatory Visit: Payer: Self-pay

## 2019-10-19 ENCOUNTER — Ambulatory Visit (HOSPITAL_COMMUNITY): Payer: PPO

## 2019-10-19 ENCOUNTER — Telehealth: Payer: Self-pay

## 2019-10-19 ENCOUNTER — Inpatient Hospital Stay: Payer: PPO | Attending: Oncology

## 2019-10-19 DIAGNOSIS — D631 Anemia in chronic kidney disease: Secondary | ICD-10-CM

## 2019-10-19 DIAGNOSIS — D469 Myelodysplastic syndrome, unspecified: Secondary | ICD-10-CM

## 2019-10-19 DIAGNOSIS — N189 Chronic kidney disease, unspecified: Secondary | ICD-10-CM

## 2019-10-19 LAB — CBC WITH DIFFERENTIAL (CANCER CENTER ONLY)
Abs Immature Granulocytes: 0.03 10*3/uL (ref 0.00–0.07)
Basophils Absolute: 0 10*3/uL (ref 0.0–0.1)
Basophils Relative: 0 %
Eosinophils Absolute: 0.1 10*3/uL (ref 0.0–0.5)
Eosinophils Relative: 2 %
HCT: 25 % — ABNORMAL LOW (ref 36.0–46.0)
Hemoglobin: 7.9 g/dL — ABNORMAL LOW (ref 12.0–15.0)
Immature Granulocytes: 1 %
Lymphocytes Relative: 16 %
Lymphs Abs: 0.8 10*3/uL (ref 0.7–4.0)
MCH: 29.2 pg (ref 26.0–34.0)
MCHC: 31.6 g/dL (ref 30.0–36.0)
MCV: 92.3 fL (ref 80.0–100.0)
Monocytes Absolute: 0.8 10*3/uL (ref 0.1–1.0)
Monocytes Relative: 16 %
Neutro Abs: 3.5 10*3/uL (ref 1.7–7.7)
Neutrophils Relative %: 65 %
Platelet Count: 233 10*3/uL (ref 150–400)
RBC: 2.71 MIL/uL — ABNORMAL LOW (ref 3.87–5.11)
RDW: 20.6 % — ABNORMAL HIGH (ref 11.5–15.5)
WBC Count: 5.3 10*3/uL (ref 4.0–10.5)
nRBC: 0 % (ref 0.0–0.2)

## 2019-10-19 LAB — CMP (CANCER CENTER ONLY)
ALT: 7 U/L (ref 0–44)
AST: 19 U/L (ref 15–41)
Albumin: 3.3 g/dL — ABNORMAL LOW (ref 3.5–5.0)
Alkaline Phosphatase: 93 U/L (ref 38–126)
Anion gap: 6 (ref 5–15)
BUN: 48 mg/dL — ABNORMAL HIGH (ref 8–23)
CO2: 26 mmol/L (ref 22–32)
Calcium: 9.1 mg/dL (ref 8.9–10.3)
Chloride: 106 mmol/L (ref 98–111)
Creatinine: 1.9 mg/dL — ABNORMAL HIGH (ref 0.44–1.00)
GFR, Est AFR Am: 32 mL/min — ABNORMAL LOW (ref >60)
GFR, Estimated: 27 mL/min — ABNORMAL LOW (ref >60)
Glucose, Bld: 140 mg/dL — ABNORMAL HIGH (ref 70–99)
Potassium: 4.9 mmol/L (ref 3.5–5.1)
Sodium: 138 mmol/L (ref 135–145)
Total Bilirubin: 0.9 mg/dL (ref 0.3–1.2)
Total Protein: 7.7 g/dL (ref 6.5–8.1)

## 2019-10-19 LAB — PREPARE RBC (CROSSMATCH)

## 2019-10-19 LAB — SAMPLE TO BLOOD BANK

## 2019-10-19 NOTE — Progress Notes (Signed)
Called and spoke to Patricia Mata in blood bank to confirm orders have been received.

## 2019-10-19 NOTE — Telephone Encounter (Signed)
Dr. Alen Blew made aware of patient's hemoglobin of 7.9. Per Dr. Alen Blew patient to receive one unit of blood tomorrow. Patient is aware of tomorrow's appointment time and knows not to remove blue blood bank bracelet.

## 2019-10-20 ENCOUNTER — Inpatient Hospital Stay: Payer: PPO

## 2019-10-20 ENCOUNTER — Other Ambulatory Visit: Payer: PPO

## 2019-10-20 ENCOUNTER — Other Ambulatory Visit: Payer: Self-pay

## 2019-10-20 VITALS — BP 139/47 | HR 56 | Temp 98.4°F | Resp 18

## 2019-10-20 DIAGNOSIS — D469 Myelodysplastic syndrome, unspecified: Secondary | ICD-10-CM

## 2019-10-20 MED ORDER — DIPHENHYDRAMINE HCL 25 MG PO CAPS
25.0000 mg | ORAL_CAPSULE | Freq: Once | ORAL | Status: AC
  Administered 2019-10-20: 25 mg via ORAL

## 2019-10-20 MED ORDER — FUROSEMIDE 10 MG/ML IJ SOLN
20.0000 mg | Freq: Once | INTRAMUSCULAR | Status: AC
  Administered 2019-10-20: 20 mg via INTRAVENOUS

## 2019-10-20 MED ORDER — SODIUM CHLORIDE 0.9% IV SOLUTION
250.0000 mL | Freq: Once | INTRAVENOUS | Status: AC
  Administered 2019-10-20: 250 mL via INTRAVENOUS
  Filled 2019-10-20: qty 250

## 2019-10-20 MED ORDER — DIPHENHYDRAMINE HCL 25 MG PO CAPS
ORAL_CAPSULE | ORAL | Status: AC
  Filled 2019-10-20: qty 1

## 2019-10-20 MED ORDER — LUSPATERCEPT-AAMT 75 MG ~~LOC~~ SOLR
0.9000 mg/kg | Freq: Once | SUBCUTANEOUS | Status: AC
  Administered 2019-10-20: 75 mg via SUBCUTANEOUS
  Filled 2019-10-20: qty 1.5

## 2019-10-20 MED ORDER — ACETAMINOPHEN 325 MG PO TABS
650.0000 mg | ORAL_TABLET | Freq: Once | ORAL | Status: AC
  Administered 2019-10-20: 650 mg via ORAL

## 2019-10-20 MED ORDER — FUROSEMIDE 10 MG/ML IJ SOLN
INTRAMUSCULAR | Status: AC
  Filled 2019-10-20: qty 2

## 2019-10-20 MED ORDER — ACETAMINOPHEN 325 MG PO TABS
ORAL_TABLET | ORAL | Status: AC
  Filled 2019-10-20: qty 2

## 2019-10-20 NOTE — Patient Instructions (Signed)
Luspatercept injection What is this medicine? LUSPATERCEPT (lus PAT er sept) helps your body make more red blood cells. This medicine is used to treat anemia caused by beta thalassemia or myelodysplastic syndromes. This medicine may be used for other purposes; ask your health care provider or pharmacist if you have questions. COMMON BRAND NAME(S): REBLOZYL What should I tell my health care provider before I take this medicine? They need to know if you have any of these conditions:  cigarette smoker  have had your spleen removed  high blood pressure  history of blood clots  an unusual or allergic reaction to luspatercept, other medicines, foods, dyes or preservatives  pregnant or trying to get pregnant  breast-feeding How should I use this medicine? This medicine is for injection under the skin. It is given by a healthcare professional in a hospital or clinic setting. Talk to your pediatrician about the use of the medicine in children. This medicine is not approved for use in children. Overdosage: If you think you have taken too much of this medicine contact a poison control center or emergency room at once. NOTE: This medicine is only for you. Do not share this medicine with others. What if I miss a dose? Keep appointments for follow-up doses. It is important not to miss your dose. Call your doctor or healthcare professional if you are unable to keep an appointment. What may interact with this medicine? Interactions are not expected. This list may not describe all possible interactions. Give your health care provider a list of all the medicines, herbs, non-prescription drugs, or dietary supplements you use. Also tell them if you smoke, drink alcohol, or use illegal drugs. Some items may interact with your medicine. What should I watch for while using this medicine? Your condition will be monitored carefully while you are receiving this medicine. Do not become pregnant while taking  this medicine or for 3 months after stopping it. Women should inform their healthcare professional if they wish to become pregnant or think they might be pregnant. There is a potential for serious side effects and harm to an unborn child. Talk to your healthcare professional for more information. Do not breast-feed an infant while taking this medicine. You may need blood work done while you are taking this medicine. What side effects may I notice from receiving this medicine? Side effects that you should report to your doctor or health care professional as soon as possible:  allergic reactions like skin rash, itching or hives; swelling of the face, lips, or tongue  signs and symptoms of a blood clot such as chest pain; shortness of breath; pain, swelling, or warmth in the leg  signs and symptoms of a stroke like changes in vision; confusion; trouble speaking or understanding; severe headaches; sudden numbness or weakness of the face, arm or leg; trouble walking; dizziness; loss of balance or coordination Side effects that usually do not require medical attention (report these to your doctor or health care professional if they continue or are bothersome):  cough  diarrhea  dizziness  headache  joint pain  stomach pain  tiredness This list may not describe all possible side effects. Call your doctor for medical advice about side effects. You may report side effects to FDA at 1-800-FDA-1088. Where should I keep my medicine? This medicine is given in a hospital or clinic and will not be stored at home. NOTE: This sheet is a summary. It may not cover all possible information. If you have questions about   this medicine, talk to your doctor, pharmacist, or health care provider.  2020 Elsevier/Gold Standard (2018-10-20 15:57:59)   Blood Transfusion, Adult, Care After This sheet gives you information about how to care for yourself after your procedure. Your doctor may also give you more  specific instructions. If you have problems or questions, contact your doctor. What can I expect after the procedure? After the procedure, it is common to have:  Bruising and soreness at the IV site.  A fever or chills on the day of the procedure. This may be your body's response to the new blood cells received.  A headache. Follow these instructions at home: Insertion site care      Follow instructions from your doctor about how to take care of your insertion site. This is where an IV tube was put into your vein. Make sure you: ? Wash your hands with soap and water before and after you change your bandage (dressing). If you cannot use soap and water, use hand sanitizer. ? Change your bandage as told by your doctor.  Check your insertion site every day for signs of infection. Check for: ? Redness, swelling, or pain. ? Bleeding from the site. ? Warmth. ? Pus or a bad smell. General instructions  Take over-the-counter and prescription medicines only as told by your doctor.  Rest as told by your doctor.  Go back to your normal activities as told by your doctor.  Keep all follow-up visits as told by your doctor. This is important. Contact a doctor if:  You have itching or red, swollen areas of skin (hives).  You feel worried or nervous (anxious).  You feel weak after doing your normal activities.  You have redness, swelling, warmth, or pain around the insertion site.  You have blood coming from the insertion site, and the blood does not stop with pressure.  You have pus or a bad smell coming from the insertion site. Get help right away if:  You have signs of a serious reaction. This may be coming from an allergy or the body's defense system (immune system). Signs include: ? Trouble breathing or shortness of breath. ? Swelling of the face or feeling warm (flushed). ? Fever or chills. ? Head, chest, or back pain. ? Dark pee (urine) or blood in the pee. ? Widespread  rash. ? Fast heartbeat. ? Feeling dizzy or light-headed. You may receive your blood transfusion in an outpatient setting. If so, you will be told whom to contact to report any reactions. These symptoms may be an emergency. Do not wait to see if the symptoms will go away. Get medical help right away. Call your local emergency services (911 in the [place] Do not drive yourself to the hospital. Summary  Bruising and soreness at the IV site are common.  Check your insertion site every day for signs of infection.  Rest as told by your doctor. Go back to your normal activities as told by your doctor.  Get help right away if you have signs of a serious reaction. This information is not intended to replace advice given to you by your health care provider. Make sure you discuss any questions you have with your health care provider. Document Revised: 12/24/2018 Document Reviewed: 12/24/2018 Elsevier Patient Education  [place].

## 2019-10-21 ENCOUNTER — Ambulatory Visit (HOSPITAL_COMMUNITY): Payer: PPO

## 2019-10-21 LAB — FUNGUS CULTURE WITH STAIN

## 2019-10-21 LAB — TYPE AND SCREEN
ABO/RH(D): A NEG
Antibody Screen: NEGATIVE
Unit division: 0

## 2019-10-21 LAB — BPAM RBC
Blood Product Expiration Date: 202104232359
ISSUE DATE / TIME: 202104071001
Unit Type and Rh: 600

## 2019-10-21 LAB — FUNGUS CULTURE RESULT

## 2019-10-21 LAB — FUNGAL ORGANISM REFLEX

## 2019-10-24 ENCOUNTER — Encounter: Payer: Self-pay | Admitting: Nurse Practitioner

## 2019-10-25 ENCOUNTER — Ambulatory Visit: Payer: Self-pay | Admitting: Nurse Practitioner

## 2019-10-26 ENCOUNTER — Ambulatory Visit (HOSPITAL_COMMUNITY): Payer: PPO

## 2019-10-28 ENCOUNTER — Ambulatory Visit (HOSPITAL_COMMUNITY): Payer: PPO

## 2019-10-30 ENCOUNTER — Other Ambulatory Visit: Payer: Self-pay | Admitting: Nurse Practitioner

## 2019-11-02 ENCOUNTER — Ambulatory Visit (HOSPITAL_COMMUNITY): Payer: PPO

## 2019-11-06 LAB — ACID FAST CULTURE WITH REFLEXED SENSITIVITIES (MYCOBACTERIA): Acid Fast Culture: NEGATIVE

## 2019-11-09 ENCOUNTER — Encounter (HOSPITAL_COMMUNITY): Payer: Self-pay | Admitting: *Deleted

## 2019-11-09 DIAGNOSIS — Z1231 Encounter for screening mammogram for malignant neoplasm of breast: Secondary | ICD-10-CM | POA: Diagnosis not present

## 2019-11-09 NOTE — Progress Notes (Signed)
Pt dropped off disability claim form she needed completed. Form completed and signed by Dr Haroldine Laws. Pt aware and will p/u at front desk.

## 2019-11-10 ENCOUNTER — Inpatient Hospital Stay: Payer: PPO

## 2019-11-10 ENCOUNTER — Inpatient Hospital Stay (HOSPITAL_BASED_OUTPATIENT_CLINIC_OR_DEPARTMENT_OTHER): Payer: PPO | Admitting: Oncology

## 2019-11-10 ENCOUNTER — Other Ambulatory Visit: Payer: Self-pay | Admitting: Emergency Medicine

## 2019-11-10 ENCOUNTER — Other Ambulatory Visit: Payer: Self-pay

## 2019-11-10 VITALS — BP 197/60 | HR 69 | Temp 98.5°F | Resp 18 | Ht 64.0 in | Wt 184.4 lb

## 2019-11-10 DIAGNOSIS — D469 Myelodysplastic syndrome, unspecified: Secondary | ICD-10-CM

## 2019-11-10 DIAGNOSIS — D631 Anemia in chronic kidney disease: Secondary | ICD-10-CM

## 2019-11-10 DIAGNOSIS — N189 Chronic kidney disease, unspecified: Secondary | ICD-10-CM

## 2019-11-10 LAB — CMP (CANCER CENTER ONLY)
ALT: 13 U/L (ref 0–44)
AST: 24 U/L (ref 15–41)
Albumin: 3.5 g/dL (ref 3.5–5.0)
Alkaline Phosphatase: 98 U/L (ref 38–126)
Anion gap: 11 (ref 5–15)
BUN: 47 mg/dL — ABNORMAL HIGH (ref 8–23)
CO2: 22 mmol/L (ref 22–32)
Calcium: 9 mg/dL (ref 8.9–10.3)
Chloride: 105 mmol/L (ref 98–111)
Creatinine: 1.94 mg/dL — ABNORMAL HIGH (ref 0.44–1.00)
GFR, Est AFR Am: 31 mL/min — ABNORMAL LOW (ref >60)
GFR, Estimated: 27 mL/min — ABNORMAL LOW (ref >60)
Glucose, Bld: 226 mg/dL — ABNORMAL HIGH (ref 70–99)
Potassium: 3.9 mmol/L (ref 3.5–5.1)
Sodium: 138 mmol/L (ref 135–145)
Total Bilirubin: 0.9 mg/dL (ref 0.3–1.2)
Total Protein: 7.7 g/dL (ref 6.5–8.1)

## 2019-11-10 LAB — CBC WITH DIFFERENTIAL (CANCER CENTER ONLY)
Abs Immature Granulocytes: 0.04 10*3/uL (ref 0.00–0.07)
Basophils Absolute: 0 10*3/uL (ref 0.0–0.1)
Basophils Relative: 0 %
Eosinophils Absolute: 0.1 10*3/uL (ref 0.0–0.5)
Eosinophils Relative: 1 %
HCT: 27.1 % — ABNORMAL LOW (ref 36.0–46.0)
Hemoglobin: 8.8 g/dL — ABNORMAL LOW (ref 12.0–15.0)
Immature Granulocytes: 1 %
Lymphocytes Relative: 12 %
Lymphs Abs: 0.7 10*3/uL (ref 0.7–4.0)
MCH: 30 pg (ref 26.0–34.0)
MCHC: 32.5 g/dL (ref 30.0–36.0)
MCV: 92.5 fL (ref 80.0–100.0)
Monocytes Absolute: 0.7 10*3/uL (ref 0.1–1.0)
Monocytes Relative: 13 %
Neutro Abs: 4.2 10*3/uL (ref 1.7–7.7)
Neutrophils Relative %: 73 %
Platelet Count: 203 10*3/uL (ref 150–400)
RBC: 2.93 MIL/uL — ABNORMAL LOW (ref 3.87–5.11)
RDW: 20.8 % — ABNORMAL HIGH (ref 11.5–15.5)
WBC Count: 5.7 10*3/uL (ref 4.0–10.5)
nRBC: 0.5 % — ABNORMAL HIGH (ref 0.0–0.2)

## 2019-11-10 LAB — SAMPLE TO BLOOD BANK

## 2019-11-10 MED ORDER — LUSPATERCEPT-AAMT 75 MG ~~LOC~~ SOLR
0.9000 mg/kg | Freq: Once | SUBCUTANEOUS | Status: AC
  Administered 2019-11-10: 75 mg via SUBCUTANEOUS
  Filled 2019-11-10: qty 1.5

## 2019-11-10 NOTE — Progress Notes (Signed)
Hematology and Oncology Follow Up Visit  Patricia Mata [id number] April 20, 1956 64 y.o. 11/10/2019 9:48 AM   Principle Diagnosis: 64 year old woman with Myelodysplastic syndrome presented with IPSS-R score of 2.5 in 2017.  Secondary diagnosis: Stage III colon cancer diagnosed in 2008.  She has no evidence of relapse and presumably cured at this time.    Prior Therapy: She is S/P hemicolectomy done in 11/2006. 1/13 lymph nodes are positive.  This was followed by 12 cycles of FOLFOX completed in 05/2007  Aranesp 300 mcg every 2 to 3 weeks therapy was ineffective to eliminate transfusion needs.  5-azacytidine at 75 mg/m square subcutaneously started in October 2018.  She is status post 7 cycles of therapy completed in April 2019.   Current therapy: Reblozyl 75 mg subcutaneous injection every 3 weeks started in December 2021.      Interim History:  Mrs. Cake returns today for a repeat evaluation.  Since the last visit, she reports of feeling well overall without any residual complaints.  She was hospitalized for respiratory failure related to pneumonia and possible heart failure as well.  She has fully recovered at this time without any fluid retention, shortness of breath or difficulty breathing.  Performance status and quality of life remains unchanged.     Current Outpatient Medications on File Prior to Visit  Medication Sig Dispense Refill  . allopurinol (ZYLOPRIM) 100 MG tablet Take 100 mg by mouth daily.    Marland Kitchen amiodarone (PACERONE) 200 MG tablet Take 200 mg by mouth daily.    Marland Kitchen amLODipine (NORVASC) 10 MG tablet Take 1 tablet (10 mg total) by mouth daily. 90 tablet 3  . atorvastatin (LIPITOR) 40 MG tablet TAKE 1 TABLET BY MOUTH EVERY DAY 90 tablet 0  . blood glucose meter kit and supplies Dispense based on patient and insurance preference. Use up to four times daily as directed. (FOR ICD-10 E10.9, E11.9). 1 each 3  . colchicine 0.6 MG tablet Take 0.6 mg by mouth 2 (two) times  daily as needed (for gout).    Marland Kitchen doxazosin (CARDURA) 1 MG tablet Take 1 tablet (1 mg total) by mouth at bedtime. 30 tablet 6  . doxycycline (ADOXA) 100 MG tablet Take 1 tablet (100 mg total) by mouth 2 (two) times daily for 7 days. 14 tablet 0  . furosemide (LASIX) 40 MG tablet TAKE 1 TABLET BY MOUTH EVERY DAY 90 tablet 1  . gabapentin (NEURONTIN) 300 MG capsule     . glucose blood test strip Use as instructed 100 each 12  . hydrALAZINE (APRESOLINE) 100 MG tablet Take 1 tablet (100 mg total) by mouth 3 (three) times daily. 180 tablet 5  . insulin glargine (LANTUS) 100 unit/mL SOPN Inject 0.15 mLs (15 Units total) into the skin daily. (Patient taking differently: Inject 15 Units into the skin daily as needed (blood sugar over 150). )    . Insulin Pen Needle 29G X 5MM MISC Use as directed 200 each 0  . latanoprost (XALATAN) 0.005 % ophthalmic solution Place 1 drop into both eyes at bedtime.     Marland Kitchen levothyroxine (SYNTHROID) 88 MCG tablet Take 1 tablet (88 mcg total) by mouth daily. 30 tablet 2  . metoprolol succinate (TOPROL-XL) 25 MG 24 hr tablet Take 1 tablet (25 mg total) by mouth daily. STOP taking this unless heart rate >100bpm OR directed by your doctor 30 tablet 5  . Multiple Vitamins-Minerals (CENTRUM SILVER 50+WOMEN) TABS Take 1 tablet by mouth daily.    Marland Kitchen  sildenafil (REVATIO) 20 MG tablet Take 1 tablet (20 mg total) by mouth 3 (three) times daily. 270 tablet 3  . sitaGLIPtin (JANUVIA) 50 MG tablet Take 1 tablet (50 mg total) by mouth daily. 90 tablet 0               Allergies:  Allergies  Allergen Reactions  . Ancef [Cefazolin] Itching    Severe itching- after procedure, ancef was the antibiotic.-04/02/17 Tolerates penicillins  . Lactose Intolerance (Gi) Diarrhea  . Sulfa Antibiotics Rash and Other (See Comments)    Blisters, also     Physical Exam:  Blood pressure (!) 197/60, pulse 69, temperature 98.5 F (36.9 C), temperature source Oral, resp. rate 18, height '5\' 4"'   (1.626 m), weight 184 lb 6.4 oz (83.6 kg), SpO2 100 %.       ECOG: 2     General appearance: Comfortable appearing without any discomfort Head: Normocephalic without any trauma Oropharynx: Mucous membranes are moist and pink without any thrush or ulcers. Eyes: Pupils are equal and round reactive to light. Lymph nodes: No cervical, supraclavicular, inguinal or axillary lymphadenopathy.   Heart:regular rate and rhythm.  S1 and S2 without leg edema.  Systolic ejection murmur noted. Lung: Clear without any rhonchi or wheezes.  No dullness to percussion. Abdomin: Soft, nontender, nondistended with good bowel sounds.  No hepatosplenomegaly. Musculoskeletal: No joint deformity or effusion.  Full range of motion noted. Neurological: No deficits noted on motor, sensory and deep tendon reflex exam. Skin: No petechial rash or dryness.  Appeared moist.                   Lab Results: Lab Results  Component Value Date   WBC 5.7 11/10/2019   HGB 8.8 (L) 11/10/2019   HCT 27.1 (L) 11/10/2019   MCV 92.5 11/10/2019   PLT 203 11/10/2019     Chemistry      Component Value Date/Time   NA 138 11/10/2019 0849   NA 135 06/22/2019 1216   NA 134 (L) 05/26/2017 0949   K 3.9 11/10/2019 0849   K 4.0 05/26/2017 0949   CL 105 11/10/2019 0849   CL 96 (L) 03/31/2012 1513   CO2 22 11/10/2019 0849   CO2 24 05/26/2017 0949   BUN 47 (H) 11/10/2019 0849   BUN 64 (H) 06/22/2019 1216   BUN 29.2 (H) 05/26/2017 0949   CREATININE 1.94 (H) 11/10/2019 0849   CREATININE 2.24 (H) 01/27/2019 1355   CREATININE 1.4 (H) 05/26/2017 0949      Component Value Date/Time   CALCIUM 9.0 11/10/2019 0849   CALCIUM 8.9 05/26/2017 0949   ALKPHOS 98 11/10/2019 0849   ALKPHOS 157 (H) 05/26/2017 0949   AST 24 11/10/2019 0849   AST 29 05/26/2017 0949   ALT 13 11/10/2019 0849   ALT 23 05/26/2017 0949   BILITOT 0.9 11/10/2019 0849   BILITOT 2.19 (H) 05/26/2017 0949      Impression and Plan:  26-year  old woman with:   1.  MDS diagnosed in 2017 with anemia.    She is status post therapy outlined above and currently on Reblozyl without any major complications.  Risks and benefits of continuing this therapy were reviewed at this time.  She is agreeable to continue given her reasonable tolerance as well as decrease in her transfusion needs.  2.  Anemia: Hemoglobin is adequate at this time I will not require any transfusion.  3. Hypertension: Blood pressure is elevated today but has been under  reasonable control previously.  Continue to monitor therapy.   4.  Aortic stenosis: He continues to follow with cardiology.  No signs or symptoms of heart failure at this time.   5.  Prognosis: Disease is incurable although aggressive measures are warranted at this time given her reasonable performance status.  6. Follow-up: She will continue to return every 3 weeks and MD follow-up in 9 weeks.  30  minutes were dedicated to this encounter.  The time was spent on reviewing laboratory data, discussing disease status and treatment options.   Zola Button, MD 4/28/20219:48 AM

## 2019-11-10 NOTE — Progress Notes (Signed)
MD aware of BP, no new orders.

## 2019-11-11 ENCOUNTER — Telehealth: Payer: Self-pay | Admitting: Oncology

## 2019-11-11 DIAGNOSIS — R921 Mammographic calcification found on diagnostic imaging of breast: Secondary | ICD-10-CM | POA: Diagnosis not present

## 2019-11-11 LAB — HM MAMMOGRAPHY

## 2019-11-11 NOTE — Telephone Encounter (Signed)
Scheduled appt per 4/28 los.  Pt will get an updated appt calendar at their next scheduled appt.

## 2019-11-22 ENCOUNTER — Other Ambulatory Visit: Payer: Self-pay | Admitting: Radiology

## 2019-11-25 ENCOUNTER — Other Ambulatory Visit: Payer: Self-pay | Admitting: Nurse Practitioner

## 2019-11-29 ENCOUNTER — Telehealth (HOSPITAL_COMMUNITY): Payer: Self-pay | Admitting: *Deleted

## 2019-11-29 ENCOUNTER — Telehealth (HOSPITAL_COMMUNITY): Payer: Self-pay | Admitting: Pharmacist

## 2019-11-29 NOTE — Telephone Encounter (Signed)
Patient Advocate Encounter   Received notification from [facility] that prior authorization for Sildenafil is required.   PA submitted via phone. Status is pending   Will continue to follow.  Audry Riles, PharmD, BCPS, BCCP, CPP Heart Failure Clinic Pharmacist [phone]

## 2019-11-29 NOTE — Telephone Encounter (Signed)
Received a VM from [facility] about medication prior auth needing more information. VM forwarded to pharmacy staff.

## 2019-11-30 ENCOUNTER — Other Ambulatory Visit: Payer: Self-pay

## 2019-11-30 ENCOUNTER — Encounter (HOSPITAL_COMMUNITY): Payer: Self-pay | Admitting: Internal Medicine

## 2019-11-30 ENCOUNTER — Ambulatory Visit (HOSPITAL_COMMUNITY)
Admission: RE | Admit: 2019-11-30 | Discharge: 2019-11-30 | Disposition: A | Discharge status: Home / Self Care | Payer: Medicare Other | Source: Ambulatory Visit | Attending: Internal Medicine | Admitting: Internal Medicine

## 2019-11-30 ENCOUNTER — Telehealth (HOSPITAL_COMMUNITY): Payer: Self-pay | Admitting: *Deleted

## 2019-11-30 ENCOUNTER — Inpatient Hospital Stay: Payer: Medicare Other | Attending: Oncology

## 2019-11-30 VITALS — BP 210/64 | HR 68 | Wt 190.0 lb

## 2019-11-30 DIAGNOSIS — D469 Myelodysplastic syndrome, unspecified: Secondary | ICD-10-CM | POA: Diagnosis not present

## 2019-11-30 DIAGNOSIS — Z9889 Other specified postprocedural states: Secondary | ICD-10-CM | POA: Diagnosis not present

## 2019-11-30 DIAGNOSIS — N184 Chronic kidney disease, stage 4 (severe): Secondary | ICD-10-CM | POA: Insufficient documentation

## 2019-11-30 DIAGNOSIS — Z85038 Personal history of other malignant neoplasm of large intestine: Secondary | ICD-10-CM | POA: Insufficient documentation

## 2019-11-30 DIAGNOSIS — M4628 Osteomyelitis of vertebra, sacral and sacrococcygeal region: Secondary | ICD-10-CM | POA: Insufficient documentation

## 2019-11-30 DIAGNOSIS — D638 Anemia in other chronic diseases classified elsewhere: Secondary | ICD-10-CM | POA: Diagnosis not present

## 2019-11-30 DIAGNOSIS — Z880 Allergy status to penicillin: Secondary | ICD-10-CM | POA: Insufficient documentation

## 2019-11-30 DIAGNOSIS — Z87891 Personal history of nicotine dependence: Secondary | ICD-10-CM | POA: Insufficient documentation

## 2019-11-30 DIAGNOSIS — I050 Rheumatic mitral stenosis: Secondary | ICD-10-CM | POA: Diagnosis not present

## 2019-11-30 DIAGNOSIS — Z881 Allergy status to other antibiotic agents status: Secondary | ICD-10-CM | POA: Diagnosis not present

## 2019-11-30 DIAGNOSIS — I130 Hypertensive heart and chronic kidney disease with heart failure and stage 1 through stage 4 chronic kidney disease, or unspecified chronic kidney disease: Secondary | ICD-10-CM | POA: Diagnosis not present

## 2019-11-30 DIAGNOSIS — Z8701 Personal history of pneumonia (recurrent): Secondary | ICD-10-CM | POA: Diagnosis not present

## 2019-11-30 DIAGNOSIS — Z66 Do not resuscitate: Secondary | ICD-10-CM | POA: Diagnosis not present

## 2019-11-30 DIAGNOSIS — Z8616 Personal history of COVID-19: Secondary | ICD-10-CM | POA: Insufficient documentation

## 2019-11-30 DIAGNOSIS — I350 Nonrheumatic aortic (valve) stenosis: Secondary | ICD-10-CM | POA: Diagnosis not present

## 2019-11-30 DIAGNOSIS — I10 Essential (primary) hypertension: Secondary | ICD-10-CM | POA: Diagnosis not present

## 2019-11-30 DIAGNOSIS — I5032 Chronic diastolic (congestive) heart failure: Secondary | ICD-10-CM | POA: Diagnosis present

## 2019-11-30 DIAGNOSIS — Z794 Long term (current) use of insulin: Secondary | ICD-10-CM | POA: Diagnosis not present

## 2019-11-30 DIAGNOSIS — G4733 Obstructive sleep apnea (adult) (pediatric): Secondary | ICD-10-CM | POA: Insufficient documentation

## 2019-11-30 DIAGNOSIS — E669 Obesity, unspecified: Secondary | ICD-10-CM | POA: Diagnosis not present

## 2019-11-30 DIAGNOSIS — M109 Gout, unspecified: Secondary | ICD-10-CM | POA: Insufficient documentation

## 2019-11-30 DIAGNOSIS — I2721 Secondary pulmonary arterial hypertension: Secondary | ICD-10-CM | POA: Diagnosis not present

## 2019-11-30 DIAGNOSIS — E1122 Type 2 diabetes mellitus with diabetic chronic kidney disease: Secondary | ICD-10-CM | POA: Diagnosis not present

## 2019-11-30 DIAGNOSIS — I480 Paroxysmal atrial fibrillation: Secondary | ICD-10-CM

## 2019-11-30 DIAGNOSIS — Z79899 Other long term (current) drug therapy: Secondary | ICD-10-CM | POA: Diagnosis not present

## 2019-11-30 DIAGNOSIS — I272 Pulmonary hypertension, unspecified: Secondary | ICD-10-CM | POA: Diagnosis not present

## 2019-11-30 DIAGNOSIS — Z8249 Family history of ischemic heart disease and other diseases of the circulatory system: Secondary | ICD-10-CM | POA: Insufficient documentation

## 2019-11-30 DIAGNOSIS — E119 Type 2 diabetes mellitus without complications: Secondary | ICD-10-CM

## 2019-11-30 DIAGNOSIS — Z833 Family history of diabetes mellitus: Secondary | ICD-10-CM | POA: Insufficient documentation

## 2019-11-30 MED ORDER — CLONIDINE HCL 0.1 MG PO TABS: 0.1000 mg | ORAL_TABLET | Freq: Two times a day (BID) | ORAL | 6 refills | Status: DC

## 2019-11-30 MED ORDER — GLUCOSE BLOOD VI STRP: ORAL_STRIP | 2 refills | Status: DC

## 2019-11-30 MED ORDER — LINAGLIPTIN 5 MG PO TABS: 5.0000 mg | ORAL_TABLET | Freq: Every day | ORAL | 1 refills | Status: DC

## 2019-11-30 MED ORDER — ONETOUCH ULTRASOFT LANCETS MISC: 12 refills | Status: DC

## 2019-11-30 MED ORDER — ONETOUCH ULTRALINK W/DEVICE KIT: PACK | 0 refills | Status: DC

## 2019-11-30 NOTE — Telephone Encounter (Signed)
Advanced Heart Failure Patient Advocate Encounter  Prior Authorization for sildenafil has been approved.    Effective dates: 11/29/19 through 11/28/20  Audry Riles, PharmD, BCPS, BCCP, CPP Heart Failure Clinic Pharmacist [phone]

## 2019-11-30 NOTE — Progress Notes (Signed)
Advanced Heart Failure Clinic Note   Referring Physician: PCP: [name], FNP PCP-Cardiologist: Quay Burow, MD  Hematologist: Dr. Alen Blew AHF: Dr. Haroldine Laws   HPI:  Patricia Mata is a 64 y.o. 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 Followed by Dr Gwenlyn Found for severe AS and pulmonary HTN.  Admitted on 05/10/18  with AF with RVR and recurrent anemia. Developed respiratory failure and intubated. She was transfused and placed on IV amio. Subsequently developed AKI/ESRD and placed on CVVHD. Converted to NSR on amio and weaned off the vent.  Echoshowed normal LVEF 65-70% with moderate AS and moderate MS and severe PH with RVSP 35mHG. She is not a candidate for intervention of AS or MS with advanced PAH and transfusion-dependent myelodysplasia as a result of her colon CA   [facility] normal coron[facility] and severely elevated PA pressures.-> V/Q scan was negative. She was started on sildenafil and tolerated well. She diuresed with IV lasix and transitioned to lasix 40 mg PO BID. She did not require O2 at discharge. Palliative care was consulted and decision was made to transition to DNR.  She follows with Dr. SAlen Blewand requires transfusions every 2-3 weeks as an outpatient. She also developed a buttock wound. WOC was consulted and recommended hydrotherapy for coccygeal osteo   [facility]   [place] showed mild PAH in setting of high-output (likely due to anemia). See data below  She was recently admitted for CAP 3/2-09/17/19 and treated w/ abx. Returned to hospital 3 days later on 3/8 for worsening SOB and admitted for acute hypoxic respiratory failure requiring intubation. Chest CT showed multifocal PNA COVID negative.. A/c CHF also felt to be contributing.  General cardiology was consulted and helped to manage her diuretics. Improved clinically w/ IV abx and IV lasix and successfully extubated. Was transitioned back to PO lasix 40 mg daily.   Returns to clinic for routine f/u. Says she has been feeling ok. BP has been running. Saw Nephrology earlier in the week and SBP 170. Increased doxazosin from 1660mto 60m59mhs. Feels like she is getting stronger and can walk a bit farther. No edema, orthopnea or PND. Still getting intermittent RBC transfusions. Follows BO at home SBP 150-170    Cardiac studies: RHC 10/20 RA = 8 RV = 53/3 PA = 57/12 (32) PCW = 14 (v waves to 32) Fick cardiac output/index = 8.4/4.5 PVR = 2.1 WU Ao sat = 99% PA sat = 73%, 74% High SVC sat = 74%   RHC 11/19 RA  14 RV  110/15 PA 105/40 (62) PCWP 25 Ao sat 91% PA sat 53% Fick CO/CI  6.8/4.3  PVR 5.4  AoV  Mean 74m8mAVA 1.3 cm2   PFTs 8/20 FEV1 2.24 (112%) FVC 2.63 (103%) DLCO 56%  Echo 02/15/19 EF 65% RV normal size/function. Septal flattening. Severe AS (mean 43mm55mRVSP 64mmH41m   Review of systems complete and found to be negative unless listed in HPI.    Past Medical History:  Diagnosis Date  . Acute kidney injury (nontraumatic) (HCC)  [place]5/[place]  . Cancer (HCC) [facility]   Colon   . Diabetes mellitus without complication (HCC)  [place]  . Hypertension   . Macrocytic anemia     Current Outpatient  Medications  Medication Sig Dispense Refill  . allopurinol (ZYLOPRIM) 100 MG tablet Take 100 mg by mouth daily.    Marland Kitchen amiodarone (PACERONE) 200 MG tablet TAKE 1 TABLET BY MOUTH EVERY DAY 90 tablet 1  . amLODipine (NORVASC) 10 MG tablet Take 1 tablet (10 mg total) by mouth daily. 90 tablet 3  . atorvastatin (LIPITOR) 40 MG tablet TAKE 1 TABLET BY MOUTH EVERY DAY 90 tablet 0  . blood glucose meter kit and supplies Dispense based on patient and insurance preference. Use up to four times daily as directed. (FOR  ICD-10 E10.9, E11.9). 1 each 3  . Blood Glucose Monitoring Suppl (ONETOUCH ULTRALINK) w/Device KIT Check blood sugars twice daily E11.9 1 kit 0  . colchicine 0.6 MG tablet TAKE 1 TABLET BY MOUTH TWICE A DAY AS NEEDED FOR GOUT 180 tablet 1  . doxazosin (CARDURA) 1 MG tablet Take 2 mg by mouth at bedtime.    . furosemide (LASIX) 40 MG tablet TAKE 1 TABLET BY MOUTH EVERY DAY 90 tablet 1  . glucose blood test strip Use as instructed 100 each 2  . Insulin Pen Needle 29G X 5MM MISC Use as directed 200 each 0  . JANUVIA 50 MG tablet TAKE 1 TABLET BY MOUTH EVERY DAY 90 tablet 0  . Lancets (ONETOUCH ULTRASOFT) lancets Use as instructed to check blood sugars twice daily E11.9 100 each 12  . latanoprost (XALATAN) 0.005 % ophthalmic solution Place 1 drop into both eyes at bedtime.     Marland Kitchen levothyroxine (SYNTHROID) 88 MCG tablet TAKE 1 TABLET BY MOUTH EVERY DAY 90 tablet 1  . linagliptin (TRADJENTA) 5 MG TABS tablet Take 1 tablet (5 mg total) by mouth daily. 90 tablet 1  . Multiple Vitamins-Minerals (CENTRUM SILVER 50+WOMEN) TABS Take 1 tablet by mouth daily.    Marland Kitchen oxymetazoline (AFRIN) 0.05 % nasal spray Place 1 spray into both nostrils 2 (two) times daily as needed for congestion.    . sildenafil (REVATIO) 20 MG tablet Take 1 tablet (20 mg total) by mouth 3 (three) times daily. 270 tablet 3   No current facility-administered medications for this encounter.    Allergies  Allergen Reactions  . Ancef [Cefazolin] Itching    Severe itching- after procedure, ancef was the antibiotic.-04/02/17 Tolerates penicillins  . Lactose Intolerance (Gi) Diarrhea  . Sulfa Antibiotics Rash and Other (See Comments)    Blisters, also      Social History   Socioeconomic History  . Marital status: Married    Spouse name: Not on file  . Number of children: Not on file  . Years of education: Not on file  . Highest education level: Not on file  Occupational History  . Not on file  Tobacco Use  . Smoking status:  Former Smoker    Packs/day: 0.25    Years: 20.00    Pack years: 5.00    Types: Cigarettes    Quit date: 01/31/2001    Years since quitting: 18.8  . Smokeless tobacco: Former Network engineer and Sexual Activity  . Alcohol use: Yes    Alcohol/week: 2.0 standard drinks    Types: 2 Shots of liquor per week  . Drug use: Never  . Sexual activity: Not on file  Other Topics Concern  . Not on file  Social History Narrative  . Not on file   Social Determinants of Health   Financial Resource Strain:   . Difficulty of Paying Living Expenses:   Food Insecurity:   .  Worried About Charity fundraiser in the Last Year:   . Arboriculturist in the Last Year:   Transportation Needs:   . Film/video editor (Medical):   Marland Kitchen Lack of Transportation (Non-Medical):   Physical Activity:   . Days of Exercise per Week:   . Minutes of Exercise per Session:   Stress:   . Feeling of Stress :   Social Connections:   . Frequency of Communication with Friends and Family:   . Frequency of Social Gatherings with Friends and Family:   . Attends Religious Services:   . Active Member of Clubs or Organizations:   . Attends Archivist Meetings:   Marland Kitchen Marital Status:   Intimate Partner Violence:   . Fear of Current or Ex-Partner:   . Emotionally Abused:   Marland Kitchen Physically Abused:   . Sexually Abused:       Family History  Problem Relation Age of Onset  . Hypertension Mother   . Diabetes Mother   . Cervical cancer Mother   . Heart attack Father   . Hypertension Sister   . Hypertension Brother   . Hypertension Sister   . Hypertension Sister   . Prostate cancer Brother   . HIV/AIDS Brother    Vitals:   11/30/19 1549  BP: (!) 210/64  Pulse: 68  SpO2: 100%  Weight: 86.2 kg (190 lb)    Wt Readings from Last 3 Encounters:  11/30/19 86.2 kg (190 lb)  11/10/19 83.6 kg (184 lb 6.4 oz)  10/12/19 80.6 kg (177 lb 9.6 oz)    PHYSICAL EXAM: General:  Well appearing. No resp difficulty HEENT:  normal Neck: supple. no JVD. Carotids 2+ bilat;+ bruits. No lymphadenopathy or thryomegaly appreciated. Cor: PMI nondisplaced. Regular rate & rhythm. 3/6 SEM RUSB  Lungs: clear Abdomen: soft, nontender, nondistended. No hepatosplenomegaly. No bruits or masses. Good bowel sounds. Extremities: no cyanosis, clubbing, rash, edema Neuro: alert & orientedx3, cranial nerves grossly intact. moves all 4 extremities w/o difficulty. Affect pleasant  ASSESSMENT & PLAN:  1. PAH - Echo 05/12/18 LVEF 65-70%, Grade 2 DD, Mod AS, Mild/Mod MS, Mod TR, Trivial PI, PA peak pressure 93.  - L/RHC 05/21/18 with no significant CAD, severe pulmonary HTN with PA 105/40 (62) PCW 33 PVR 5.4 WU - Suspect multifactorial in nature suspect including high output due to anemia, WHO group II (diastolic HF and valvular disease) and III (OSA) but given degree of PH cannot exclude WHO Group I component. - Repeat RHC 04/23/19 with much improved pulmonary pressures. PA 57/12 (32) PCWP 14 (v to 32) - Echo 8/20 EF 65% with normal RV size and function. + septal flattening. RVSP 78mHG - VQ negative - PFTs 8/20 with normal spirometry and moderately reduced DLCO - Sleep study previously ordered but not completed. Will reorder. - Continue sildenafil 20 mg TID cautiously (has AS).  - Recent admit 09/2019 for acute hypoxic respiratory failure 2/2 a/c CHF and PNA. Improved w/ IV diuretics  - Now back on PO diuretics, Lasix 40 mg qd and much improved  - Overall stable NYHA II-III. Volume status ok  2. Moderate-Severe AS/MS - Echos 8/20 and again 3/21 with severe AS but on exam sounds more moderate c/w previous cath.  - denies angina, dyspnea, syncope/near syncope - She has seen Dr. SAlen Blewand he has made it clear that she has a terminal illness but he would have no issue with using Plavix if needed - That said, we do not believe  AS is her primary issue here and with CKD 4 and severe PAH, likely not a TAVR candidate  3. PAF - Maintaining  NSR on amiodarone - Continue amiodarone 200 daily. Reviewed labs. TSH and HFT recently checked 3/21 and normal - Continue Toprol XL 25 mg daily - Continue to avoid anticoagulation with transfusion dependent anemia. (chronic).  - CHA2DS2/VASc is at least 4.   4. CKD 4 -Baseline SCr 2.2-2.7  -Last creatinine 4/21 was 1.94 (eGFR 31)  5. 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   6. HTN - BP markedly elevated. Start clonidine 1 bid   - continue doxazosin 2 mg - Continue amlodipine to 10 mg daily. 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  - Resolved    Glori Bickers, MD 11/30/19

## 2019-11-30 NOTE — Patient Instructions (Signed)
Start Clonidine 0.1 mg Twice daily   You have been referred to Dr Oval Linsey in the Hypertension Clinic at [facility], her office will call you for an appointment  Please call our office in November to schedule your follow up appointment  If you have any questions or concerns before your next appointment please send [place] a message through [place] or call our office at [phone].  At the [facility], you and your health needs are our priority. As part of our continuing mission to provide you with exceptional heart care, we have created designated Provider Care Teams. These Care Teams include your primary Cardiologist (physician) and Advanced Practice Providers (APPs- Physician Assistants and Nurse Practitioners) who all work together to provide you with the care you need, when you need it.   You may see any of the following providers on your designated Care Team at your next follow up: Marland Kitchen Dr Glori Bickers . Dr Loralie Champagne . Darrick Grinder, NP . Lyda Jester, PA . Audry Riles, PharmD   Please be sure to bring in all your medications bottles to every appointment.

## 2019-11-30 NOTE — Telephone Encounter (Signed)
vm received from [facility] stating sildenafil is approved. Message routed to clinic pharmacist.

## 2019-12-01 ENCOUNTER — Inpatient Hospital Stay: Payer: Medicare Other

## 2019-12-01 ENCOUNTER — Other Ambulatory Visit: Payer: Self-pay

## 2019-12-01 ENCOUNTER — Other Ambulatory Visit: Payer: PPO

## 2019-12-01 VITALS — BP 186/53 | HR 52 | Temp 98.4°F | Resp 18

## 2019-12-01 DIAGNOSIS — D631 Anemia in chronic kidney disease: Secondary | ICD-10-CM

## 2019-12-01 DIAGNOSIS — D469 Myelodysplastic syndrome, unspecified: Secondary | ICD-10-CM

## 2019-12-01 DIAGNOSIS — N189 Chronic kidney disease, unspecified: Secondary | ICD-10-CM

## 2019-12-01 LAB — CMP (CANCER CENTER ONLY)
ALT: 11 U/L (ref 0–44)
AST: 20 U/L (ref 15–41)
Albumin: 3.5 g/dL (ref 3.5–5.0)
Alkaline Phosphatase: 94 U/L (ref 38–126)
Anion gap: 11 (ref 5–15)
BUN: 71 mg/dL — ABNORMAL HIGH (ref 8–23)
CO2: 26 mmol/L (ref 22–32)
Calcium: 9 mg/dL (ref 8.9–10.3)
Chloride: 102 mmol/L (ref 98–111)
Creatinine: 2.35 mg/dL — ABNORMAL HIGH (ref 0.44–1.00)
GFR, Est AFR Am: 24 mL/min — ABNORMAL LOW (ref >60)
GFR, Estimated: 21 mL/min — ABNORMAL LOW (ref >60)
Glucose, Bld: 154 mg/dL — ABNORMAL HIGH (ref 70–99)
Potassium: 4.4 mmol/L (ref 3.5–5.1)
Sodium: 139 mmol/L (ref 135–145)
Total Bilirubin: 0.8 mg/dL (ref 0.3–1.2)
Total Protein: 7.5 g/dL (ref 6.5–8.1)

## 2019-12-01 LAB — CBC WITH DIFFERENTIAL (CANCER CENTER ONLY)
Abs Immature Granulocytes: 0.03 K/uL (ref 0.00–0.07)
Basophils Absolute: 0 K/uL (ref 0.0–0.1)
Basophils Relative: 0 %
Eosinophils Absolute: 0 K/uL (ref 0.0–0.5)
Eosinophils Relative: 1 %
HCT: 23.2 % — ABNORMAL LOW (ref 36.0–46.0)
Hemoglobin: 7.8 g/dL — ABNORMAL LOW (ref 12.0–15.0)
Immature Granulocytes: 1 %
Lymphocytes Relative: 14 %
Lymphs Abs: 0.7 K/uL (ref 0.7–4.0)
MCH: 30.2 pg (ref 26.0–34.0)
MCHC: 33.6 g/dL (ref 30.0–36.0)
MCV: 89.9 fL (ref 80.0–100.0)
Monocytes Absolute: 0.8 K/uL (ref 0.1–1.0)
Monocytes Relative: 16 %
Neutro Abs: 3.3 K/uL (ref 1.7–7.7)
Neutrophils Relative %: 68 %
Platelet Count: 180 K/uL (ref 150–400)
RBC: 2.58 MIL/uL — ABNORMAL LOW (ref 3.87–5.11)
RDW: 22.1 % — ABNORMAL HIGH (ref 11.5–15.5)
WBC Count: 4.8 K/uL (ref 4.0–10.5)
nRBC: 0.6 % — ABNORMAL HIGH (ref 0.0–0.2)

## 2019-12-01 LAB — SAMPLE TO BLOOD BANK

## 2019-12-01 LAB — PREPARE RBC (CROSSMATCH)

## 2019-12-01 MED ORDER — ACETAMINOPHEN 325 MG PO TABS
650.0000 mg | ORAL_TABLET | Freq: Once | ORAL | Status: AC
  Administered 2019-12-01: 650 mg via ORAL

## 2019-12-01 MED ORDER — SODIUM CHLORIDE 0.9% IV SOLUTION
250.0000 mL | Freq: Once | INTRAVENOUS | Status: AC
  Administered 2019-12-01: 250 mL via INTRAVENOUS
  Filled 2019-12-01: qty 250

## 2019-12-01 MED ORDER — FUROSEMIDE 10 MG/ML IJ SOLN
INTRAMUSCULAR | Status: AC
  Filled 2019-12-01: qty 2

## 2019-12-01 MED ORDER — DIPHENHYDRAMINE HCL 25 MG PO CAPS
25.0000 mg | ORAL_CAPSULE | Freq: Once | ORAL | Status: AC
  Administered 2019-12-01: 25 mg via ORAL

## 2019-12-01 MED ORDER — ACETAMINOPHEN 325 MG PO TABS
ORAL_TABLET | ORAL | Status: AC
  Filled 2019-12-01: qty 2

## 2019-12-01 MED ORDER — LUSPATERCEPT-AAMT 75 MG ~~LOC~~ SOLR
100.0000 mg | Freq: Once | SUBCUTANEOUS | Status: AC
  Administered 2019-12-01: 100 mg via SUBCUTANEOUS
  Filled 2019-12-01: qty 1.5

## 2019-12-01 MED ORDER — DIPHENHYDRAMINE HCL 25 MG PO CAPS
ORAL_CAPSULE | ORAL | Status: AC
  Filled 2019-12-01: qty 1

## 2019-12-01 MED ORDER — FUROSEMIDE 10 MG/ML IJ SOLN
20.0000 mg | Freq: Once | INTRAMUSCULAR | Status: AC
  Administered 2019-12-01: 20 mg via INTRAVENOUS

## 2019-12-01 MED ORDER — LUSPATERCEPT-AAMT 75 MG ~~LOC~~ SOLR
1.0000 mg/kg | Freq: Once | SUBCUTANEOUS | Status: DC
  Filled 2019-12-01: qty 1.6

## 2019-12-01 NOTE — Progress Notes (Signed)
Per Dr. Alen Blew, ok to proceed with tx with elevated BP and creatinine 2.35.

## 2019-12-01 NOTE — Progress Notes (Signed)
Received a call from [facility], RN to report patient's hemoglobin is 7.8. Dr. Alen Blew made aware and orders placed for patient to receive one unit of blood today. Infusion RN made aware.

## 2019-12-01 NOTE — Patient Instructions (Signed)

## 2019-12-02 LAB — BPAM RBC
Blood Product Expiration Date: 202105302359
ISSUE DATE / TIME: 202105191202
Unit Type and Rh: 600

## 2019-12-02 LAB — TYPE AND SCREEN
ABO/RH(D): A NEG
Antibody Screen: NEGATIVE
Unit division: 0

## 2019-12-07 ENCOUNTER — Encounter: Payer: Self-pay | Admitting: Nurse Practitioner

## 2019-12-07 ENCOUNTER — Ambulatory Visit (INDEPENDENT_AMBULATORY_CARE_PROVIDER_SITE_OTHER): Payer: Medicare Other | Admitting: Nurse Practitioner

## 2019-12-07 ENCOUNTER — Ambulatory Visit
Admission: RE | Admit: 2019-12-07 | Discharge: 2019-12-07 | Disposition: A | Discharge status: Home / Self Care | Payer: BC Managed Care – PPO | Source: Ambulatory Visit | Attending: Nurse Practitioner | Admitting: Nurse Practitioner

## 2019-12-07 ENCOUNTER — Other Ambulatory Visit: Payer: Self-pay

## 2019-12-07 VITALS — BP 136/78 | HR 61 | Temp 98.0°F | Ht 64.0 in | Wt 189.2 lb

## 2019-12-07 DIAGNOSIS — R059 Cough, unspecified: Secondary | ICD-10-CM

## 2019-12-07 DIAGNOSIS — E039 Hypothyroidism, unspecified: Secondary | ICD-10-CM | POA: Diagnosis not present

## 2019-12-07 DIAGNOSIS — J189 Pneumonia, unspecified organism: Secondary | ICD-10-CM

## 2019-12-07 DIAGNOSIS — E119 Type 2 diabetes mellitus without complications: Secondary | ICD-10-CM | POA: Diagnosis not present

## 2019-12-07 DIAGNOSIS — Z8701 Personal history of pneumonia (recurrent): Secondary | ICD-10-CM

## 2019-12-07 DIAGNOSIS — R05 Cough: Secondary | ICD-10-CM | POA: Diagnosis not present

## 2019-12-07 MED ORDER — DOXYCYCLINE MONOHYDRATE 100 MG PO CAPS: 100.0000 mg | ORAL_CAPSULE | Freq: Two times a day (BID) | ORAL | 0 refills | Status: DC

## 2019-12-07 MED ORDER — ALBUTEROL SULFATE HFA 108 (90 BASE) MCG/ACT IN AERS: 2.0000 | INHALATION_SPRAY | Freq: Four times a day (QID) | RESPIRATORY_TRACT | 2 refills | Status: DC | PRN

## 2019-12-07 NOTE — Progress Notes (Signed)
This visit occurred during the SARS-CoV-2 public health emergency.  Safety protocols were in place, including screening questions prior to the visit, additional usage of staff PPE, and extensive cleaning of exam room while observing appropriate contact time as indicated for disinfecting solutions.  Subjective:     Patient ID: Patricia Mata , female    DOB: Apr 12, 1956 , 64 y.o.   MRN: [id number]   Chief Complaint  Patient presents with  . Diabetes    Sugars been running high  . chest congestion    Rattling in chest     HPI  She is planning to get her second covid vaccine but now has chest congestion.    Diabetes She presents for her follow-up diabetic visit. She has type 2 diabetes mellitus. Her disease course has been stable. There are no hypoglycemic associated symptoms. Pertinent negatives for hypoglycemia include no dizziness or headaches. There are no diabetic associated symptoms. Pertinent negatives for diabetes include no blurred vision, no chest pain and no weight loss. There are no hypoglycemic complications. Symptoms are worsening. There are no diabetic complications. Current diabetic treatment includes oral agent (dual therapy). She is following a generally healthy diet. When asked about meal planning, she reported none. She rarely participates in exercise. (118-216 blood sugars. She has not been taking the tradjenta)  Cough This is a new problem. The current episode started in the past 7 days (in the last 2 days). The problem has been gradually worsening. The cough is productive of sputum (yellow secretions now was green before). Pertinent negatives include no chest pain, fever, headaches, nasal congestion, sore throat, shortness of breath or weight loss. Her past medical history is significant for pneumonia. There is no history of asthma.     Past Medical History:  Diagnosis Date  . Acute kidney injury (nontraumatic) ([place])   . Acute renal failure (ARF) ([place]) 06/08/2018   . Cancer [facility]) 2008   Colon   . Diabetes mellitus without complication ([place])   . Gout 09/08/2018  . Hypertension   . Macrocytic anemia      Family History  Problem Relation Age of Onset  . Hypertension Mother   . Diabetes Mother   . Cervical cancer Mother   . Heart attack Father   . Hypertension Sister   . Hypertension Brother   . Hypertension Sister   . Hypertension Sister   . Prostate cancer Brother   . HIV/AIDS Brother      Current Outpatient Medications:  .  allopurinol (ZYLOPRIM) 100 MG tablet, Take 100 mg by mouth daily., Disp: , Rfl:  .  amiodarone (PACERONE) 200 MG tablet, TAKE 1 TABLET BY MOUTH EVERY DAY, Disp: 90 tablet, Rfl: 1 .  amLODipine (NORVASC) 10 MG tablet, Take 1 tablet (10 mg total) by mouth daily., Disp: 90 tablet, Rfl: 3 .  atorvastatin (LIPITOR) 40 MG tablet, TAKE 1 TABLET BY MOUTH EVERY DAY, Disp: 90 tablet, Rfl: 0 .  blood glucose meter kit and supplies, Dispense based on patient and insurance preference. Use up to four times daily as directed. (FOR ICD-10 E10.9, E11.9)., Disp: 1 each, Rfl: 3 .  Blood Glucose Monitoring Suppl (ONETOUCH ULTRALINK) w/Device KIT, Check blood sugars twice daily E11.9, Disp: 1 kit, Rfl: 0 .  cloNIDine (CATAPRES) 0.1 MG tablet, Take 1 tablet (0.1 mg total) by mouth 2 (two) times daily., Disp: 60 tablet, Rfl: 6 .  colchicine 0.6 MG tablet, TAKE 1 TABLET BY MOUTH TWICE A DAY AS NEEDED FOR GOUT, Disp: 180  tablet, Rfl: 1 .  doxazosin (CARDURA) 1 MG tablet, Take 2 mg by mouth at bedtime., Disp: , Rfl:  .  furosemide (LASIX) 40 MG tablet, TAKE 1 TABLET BY MOUTH EVERY DAY, Disp: 90 tablet, Rfl: 1 .  glucose blood test strip, Use as instructed, Disp: 100 each, Rfl: 2 .  Insulin Pen Needle 29G X 5MM MISC, Use as directed, Disp: 200 each, Rfl: 0 .  Lancets (ONETOUCH ULTRASOFT) lancets, Use as instructed to check blood sugars twice daily E11.9, Disp: 100 each, Rfl: 12 .  latanoprost (XALATAN) 0.005 % ophthalmic solution, Place 1 drop  into both eyes at bedtime. , Disp: , Rfl:  .  levothyroxine (SYNTHROID) 88 MCG tablet, TAKE 1 TABLET BY MOUTH EVERY DAY, Disp: 90 tablet, Rfl: 1 .  linagliptin (TRADJENTA) 5 MG TABS tablet, Take 1 tablet (5 mg total) by mouth daily., Disp: 90 tablet, Rfl: 1 .  Multiple Vitamins-Minerals (CENTRUM SILVER 50+WOMEN) TABS, Take 1 tablet by mouth daily., Disp: , Rfl:  .  oxymetazoline (AFRIN) 0.05 % nasal spray, Place 1 spray into both nostrils 2 (two) times daily as needed for congestion., Disp: , Rfl:  .  sildenafil (REVATIO) 20 MG tablet, Take 1 tablet (20 mg total) by mouth 3 (three) times daily., Disp: 270 tablet, Rfl: 3   Allergies  Allergen Reactions  . Ancef [Cefazolin] Itching    Severe itching- after procedure, ancef was the antibiotic.-04/02/17 Tolerates penicillins  . Lactose Intolerance (Gi) Diarrhea  . Sulfa Antibiotics Rash and Other (See Comments)    Blisters, also     Review of Systems  Constitutional: Negative for fever and weight loss.  HENT: Negative for sore throat.   Eyes: Negative for blurred vision.  Respiratory: Positive for cough. Negative for shortness of breath.   Cardiovascular: Negative for chest pain, palpitations and leg swelling.  Neurological: Negative for dizziness and headaches.  Psychiatric/Behavioral: Negative.      Today's Vitals   12/07/19 1155  BP: 136/78  Pulse: 61  Temp: 98 F (36.7 C)  TempSrc: Oral  SpO2: 94%  Weight: 189 lb 3.2 oz (85.8 kg)  Height: '5\' 4"'  (1.626 m)   Body mass index is 32.48 kg/m.   Objective:  Physical Exam Vitals reviewed.  Constitutional:      General: She is not in acute distress.    Appearance: Normal appearance. She is well-developed. She is obese.  Cardiovascular:     Rate and Rhythm: Normal rate and regular rhythm.     Pulses: Normal pulses.     Heart sounds: Normal heart sounds. No murmur.  Pulmonary:     Effort: Pulmonary effort is normal.     Breath sounds: Normal breath sounds.  Chest:     Chest  wall: No tenderness.  Musculoskeletal:        General: Normal range of motion.  Skin:    General: Skin is warm and dry.     Capillary Refill: Capillary refill takes less than 2 seconds.  Neurological:     General: No focal deficit present.     Mental Status: She is alert and oriented to person, place, and time.  Psychiatric:        Mood and Affect: Mood normal.        Behavior: Behavior normal.        Thought Content: Thought content normal.        Judgment: Judgment normal.         Assessment And Plan:  1. Type 2 diabetes mellitus without complication, without long-term current use of insulin (HCC)  Chronic  Blood sugars have been elevated she is not taking tradjenta   I have advised her to start taking to see if this helps, samples given and sent to the pharmacy - Hemoglobin A1c  2. Cough  Productive cough with green color secretions  This is concerning for pneumonia as she had covid pneumonia earlier in the year  Will send for CXR and treat with an antibiotic - CBC - albuterol (PROAIR HFA) 108 (90 Base) MCG/ACT inhaler; Inhale 2 puffs into the lungs every 6 (six) hours as needed for wheezing or shortness of breath.  Dispense: 18 g; Refill: 2 - doxycycline (MONODOX) 100 MG capsule; Take 1 capsule (100 mg total) by mouth 2 (two) times daily.  Dispense: 14 capsule; Refill: 0  3. History of pneumonia  Will do follow up CXR - CBC - albuterol (PROAIR HFA) 108 (90 Base) MCG/ACT inhaler; Inhale 2 puffs into the lungs every 6 (six) hours as needed for wheezing or shortness of breath.  Dispense: 18 g; Refill: 2 - doxycycline (MONODOX) 100 MG capsule; Take 1 capsule (100 mg total) by mouth 2 (two) times daily.  Dispense: 14 capsule; Refill: 0  4. Acquired hypothyroidism  Chronic,   Will make changes to medications pending labs - T4 - T3, free - TSH    [name], FNP    THE PATIENT IS ENCOURAGED TO PRACTICE SOCIAL DISTANCING DUE TO THE COVID-19 PANDEMIC.

## 2019-12-08 LAB — CBC
Hematocrit: 27.7 % — ABNORMAL LOW (ref 34.0–46.6)
Hemoglobin: 9.1 g/dL — ABNORMAL LOW (ref 11.1–15.9)
MCH: 29.6 pg (ref 26.6–33.0)
MCHC: 32.9 g/dL (ref 31.5–35.7)
MCV: 90 fL (ref 79–97)
NRBC: 1 % — ABNORMAL HIGH (ref 0–0)
Platelets: 185 10*3/uL (ref 150–450)
RBC: 3.07 x10E6/uL — ABNORMAL LOW (ref 3.77–5.28)
RDW: 19.4 % — ABNORMAL HIGH (ref 11.7–15.4)
WBC: 6.5 10*3/uL (ref 3.4–10.8)

## 2019-12-08 LAB — TSH: TSH: 5.73 u[IU]/mL — ABNORMAL HIGH (ref 0.450–4.500)

## 2019-12-08 LAB — T3, FREE: T3, Free: 1.5 pg/mL — ABNORMAL LOW (ref 2.0–4.4)

## 2019-12-08 LAB — HEMOGLOBIN A1C
Est. average glucose Bld gHb Est-mCnc: 128 mg/dL
Hgb A1c MFr Bld: 6.1 % — ABNORMAL HIGH (ref 4.8–5.6)

## 2019-12-08 LAB — T4: T4, Total: 7.1 ug/dL (ref 4.5–12.0)

## 2019-12-14 ENCOUNTER — Other Ambulatory Visit: Payer: Self-pay | Admitting: Nurse Practitioner

## 2019-12-14 ENCOUNTER — Other Ambulatory Visit: Payer: Self-pay

## 2019-12-14 MED ORDER — ONETOUCH ULTRASOFT LANCETS MISC: 12 refills | Status: DC

## 2019-12-14 MED ORDER — BUDESONIDE-FORMOTEROL FUMARATE 80-4.5 MCG/ACT IN AERO
2.0000 | INHALATION_SPRAY | Freq: Two times a day (BID) | RESPIRATORY_TRACT | 12 refills | Status: DC
Start: 2019-12-14 — End: 2020-01-10

## 2019-12-14 NOTE — Progress Notes (Signed)
Take an extra levothyroxine on Sundays and return in 4 weeks for lab recheck.  CXR is negative for infection.  How often is she using the albuterol.  We need to avoid steroids since her blood sugar is already elevated? Make sure she is avoiding sugary foods/drinks and checking her blood sugar 2 hours after eating or just before eating her meals. Does she have any insulin at home.

## 2019-12-14 NOTE — Progress Notes (Signed)
She needs to take the basaglar 10 units daily and follow up in 8 weeks.  She can send [place] her blood sugar every Mond and Thur. She can take levothyroxine on Sat and Sun, we will recheck her thyroid studies. She needs to stop using the afrin that can cause more nosebleeds.  Also I will send a steroid inhaler for her to take 2 puffs 2 times a day to see if it helps with her cough.

## 2019-12-14 NOTE — Progress Notes (Signed)
oe

## 2019-12-15 ENCOUNTER — Other Ambulatory Visit: Payer: Self-pay

## 2019-12-15 DIAGNOSIS — Z8616 Personal history of COVID-19: Secondary | ICD-10-CM

## 2019-12-15 DIAGNOSIS — Z8701 Personal history of pneumonia (recurrent): Secondary | ICD-10-CM

## 2019-12-16 ENCOUNTER — Other Ambulatory Visit: Payer: Self-pay

## 2019-12-16 DIAGNOSIS — E7800 Pure hypercholesterolemia, unspecified: Secondary | ICD-10-CM

## 2019-12-16 MED ORDER — ATORVASTATIN CALCIUM 40 MG PO TABS: 40.0000 mg | ORAL_TABLET | Freq: Every day | ORAL | 1 refills | Status: DC

## 2019-12-20 ENCOUNTER — Encounter: Payer: Self-pay | Admitting: Nurse Practitioner

## 2019-12-20 ENCOUNTER — Ambulatory Visit: Payer: BC Managed Care – PPO | Admitting: Cardiovascular Disease

## 2019-12-21 ENCOUNTER — Other Ambulatory Visit: Payer: Self-pay

## 2019-12-21 ENCOUNTER — Inpatient Hospital Stay: Payer: Medicare Other | Attending: Oncology

## 2019-12-21 DIAGNOSIS — N189 Chronic kidney disease, unspecified: Secondary | ICD-10-CM

## 2019-12-21 DIAGNOSIS — D469 Myelodysplastic syndrome, unspecified: Secondary | ICD-10-CM | POA: Insufficient documentation

## 2019-12-21 DIAGNOSIS — D631 Anemia in chronic kidney disease: Secondary | ICD-10-CM

## 2019-12-21 LAB — COMPREHENSIVE METABOLIC PANEL
ALT: 11 U/L (ref 0–44)
AST: 19 U/L (ref 15–41)
Albumin: 3.4 g/dL — ABNORMAL LOW (ref 3.5–5.0)
Alkaline Phosphatase: 106 U/L (ref 38–126)
Anion gap: 10 (ref 5–15)
BUN: 51 mg/dL — ABNORMAL HIGH (ref 8–23)
CO2: 22 mmol/L (ref 22–32)
Calcium: 8.8 mg/dL — ABNORMAL LOW (ref 8.9–10.3)
Chloride: 105 mmol/L (ref 98–111)
Creatinine, Ser: 2.3 mg/dL — ABNORMAL HIGH (ref 0.44–1.00)
GFR calc Af Amer: 25 mL/min — ABNORMAL LOW (ref >60)
GFR calc non Af Amer: 22 mL/min — ABNORMAL LOW (ref >60)
Glucose, Bld: 136 mg/dL — ABNORMAL HIGH (ref 70–99)
Potassium: 4.2 mmol/L (ref 3.5–5.1)
Sodium: 137 mmol/L (ref 135–145)
Total Bilirubin: 0.8 mg/dL (ref 0.3–1.2)
Total Protein: 7.2 g/dL (ref 6.5–8.1)

## 2019-12-21 LAB — CBC WITH DIFFERENTIAL (CANCER CENTER ONLY)
Abs Immature Granulocytes: 0.08 10*3/uL — ABNORMAL HIGH (ref 0.00–0.07)
Basophils Absolute: 0 10*3/uL (ref 0.0–0.1)
Basophils Relative: 0 %
Eosinophils Absolute: 0.1 10*3/uL (ref 0.0–0.5)
Eosinophils Relative: 2 %
HCT: 18.9 % — ABNORMAL LOW (ref 36.0–46.0)
Hemoglobin: 6.4 g/dL — CL (ref 12.0–15.0)
Immature Granulocytes: 2 %
Lymphocytes Relative: 13 %
Lymphs Abs: 0.7 10*3/uL (ref 0.7–4.0)
MCH: 30.8 pg (ref 26.0–34.0)
MCHC: 33.9 g/dL (ref 30.0–36.0)
MCV: 90.9 fL (ref 80.0–100.0)
Monocytes Absolute: 1 10*3/uL (ref 0.1–1.0)
Monocytes Relative: 19 %
Neutro Abs: 3.5 10*3/uL (ref 1.7–7.7)
Neutrophils Relative %: 64 %
Platelet Count: 179 10*3/uL (ref 150–400)
RBC: 2.08 MIL/uL — ABNORMAL LOW (ref 3.87–5.11)
RDW: 20.9 % — ABNORMAL HIGH (ref 11.5–15.5)
WBC Count: 5.4 10*3/uL (ref 4.0–10.5)
nRBC: 1.1 % — ABNORMAL HIGH (ref 0.0–0.2)

## 2019-12-21 LAB — PREPARE RBC (CROSSMATCH)

## 2019-12-21 LAB — SAMPLE TO BLOOD BANK

## 2019-12-21 NOTE — Progress Notes (Signed)
Received a call from Modena Slater in the lab at 1225 to report patient's hemoglobin is 6.4. Dr. Alen Blew made aware at 1228 and per Dr. Alen Blew patient to receive 2 units of blood. Patient made aware that she needs to come to her scheduled appointment tomorrow to receive 2 units of blood and to keep blue blood bank bracelet intact.

## 2019-12-22 ENCOUNTER — Inpatient Hospital Stay: Payer: Medicare Other

## 2019-12-22 ENCOUNTER — Other Ambulatory Visit: Payer: Self-pay

## 2019-12-22 VITALS — BP 198/52 | HR 51 | Temp 98.1°F | Resp 18

## 2019-12-22 DIAGNOSIS — D469 Myelodysplastic syndrome, unspecified: Secondary | ICD-10-CM

## 2019-12-22 MED ORDER — FUROSEMIDE 10 MG/ML IJ SOLN
INTRAMUSCULAR | Status: AC
  Filled 2019-12-22: qty 2

## 2019-12-22 MED ORDER — ACETAMINOPHEN 325 MG PO TABS
650.0000 mg | ORAL_TABLET | Freq: Once | ORAL | Status: AC
  Administered 2019-12-22: 650 mg via ORAL

## 2019-12-22 MED ORDER — FUROSEMIDE 10 MG/ML IJ SOLN
20.0000 mg | Freq: Once | INTRAMUSCULAR | Status: AC
  Administered 2019-12-22: 20 mg via INTRAVENOUS

## 2019-12-22 MED ORDER — SODIUM CHLORIDE 0.9% IV SOLUTION
250.0000 mL | Freq: Once | INTRAVENOUS | Status: AC
  Administered 2019-12-22: 250 mL via INTRAVENOUS
  Filled 2019-12-22: qty 250

## 2019-12-22 MED ORDER — ACETAMINOPHEN 325 MG PO TABS
ORAL_TABLET | ORAL | Status: AC
  Filled 2019-12-22: qty 2

## 2019-12-22 MED ORDER — DIPHENHYDRAMINE HCL 25 MG PO CAPS
ORAL_CAPSULE | ORAL | Status: AC
  Filled 2019-12-22: qty 1

## 2019-12-22 MED ORDER — DIPHENHYDRAMINE HCL 25 MG PO CAPS
25.0000 mg | ORAL_CAPSULE | Freq: Once | ORAL | Status: AC
  Administered 2019-12-22: 25 mg via ORAL

## 2019-12-22 MED ORDER — LUSPATERCEPT-AAMT 75 MG ~~LOC~~ SOLR: 1.3300 mg/kg | Freq: Once | SUBCUTANEOUS | Status: DC

## 2019-12-22 NOTE — Patient Instructions (Signed)

## 2019-12-23 ENCOUNTER — Inpatient Hospital Stay: Payer: Medicare Other

## 2019-12-23 ENCOUNTER — Other Ambulatory Visit: Payer: Self-pay

## 2019-12-23 VITALS — BP 188/72 | HR 54 | Temp 98.3°F | Resp 18

## 2019-12-23 DIAGNOSIS — N181 Chronic kidney disease, stage 1: Secondary | ICD-10-CM

## 2019-12-23 DIAGNOSIS — Z95828 Presence of other vascular implants and grafts: Secondary | ICD-10-CM

## 2019-12-23 DIAGNOSIS — D469 Myelodysplastic syndrome, unspecified: Secondary | ICD-10-CM | POA: Diagnosis not present

## 2019-12-23 DIAGNOSIS — D631 Anemia in chronic kidney disease: Secondary | ICD-10-CM

## 2019-12-23 LAB — BPAM RBC
Blood Product Expiration Date: 202106122359
Blood Product Expiration Date: 202106152359
ISSUE DATE / TIME: 202106091237
ISSUE DATE / TIME: 202106091237
Unit Type and Rh: 600
Unit Type and Rh: 600

## 2019-12-23 LAB — TYPE AND SCREEN
ABO/RH(D): A NEG
Antibody Screen: NEGATIVE
Unit division: 0
Unit division: 0

## 2019-12-23 MED ORDER — ONETOUCH DELICA PLUS LANCET30G MISC: 2 refills | Status: DC

## 2019-12-23 MED ORDER — SODIUM CHLORIDE 0.9% FLUSH
10.0000 mL | INTRAVENOUS | Status: DC | PRN
  Filled 2019-12-23: qty 10

## 2019-12-23 MED ORDER — LUSPATERCEPT-AAMT 75 MG ~~LOC~~ SOLR
1.3300 mg/kg | Freq: Once | SUBCUTANEOUS | Status: AC
  Administered 2019-12-23: 115 mg via SUBCUTANEOUS
  Filled 2019-12-23: qty 0.8

## 2019-12-23 NOTE — Patient Instructions (Signed)
Luspatercept injection What is this medicine? LUSPATERCEPT (lus PAT er sept) helps your body make more red blood cells. This medicine is used to treat anemia caused by beta thalassemia or myelodysplastic syndromes. This medicine may be used for other purposes; ask your health care provider or pharmacist if you have questions. COMMON BRAND NAME(S): REBLOZYL What should I tell my health care provider before I take this medicine? They need to know if you have any of these conditions:  cigarette smoker  have had your spleen removed  high blood pressure  history of blood clots  an unusual or allergic reaction to luspatercept, other medicines, foods, dyes or preservatives  pregnant or trying to get pregnant  breast-feeding How should I use this medicine? This medicine is for injection under the skin. It is given by a healthcare professional in a hospital or clinic setting. Talk to your pediatrician about the use of the medicine in children. This medicine is not approved for use in children. Overdosage: If you think you have taken too much of this medicine contact a poison control center or emergency room at once. NOTE: This medicine is only for you. Do not share this medicine with others. What if I miss a dose? Keep appointments for follow-up doses. It is important not to miss your dose. Call your doctor or healthcare professional if you are unable to keep an appointment. What may interact with this medicine? Interactions are not expected. This list may not describe all possible interactions. Give your health care provider a list of all the medicines, herbs, non-prescription drugs, or dietary supplements you use. Also tell them if you smoke, drink alcohol, or use illegal drugs. Some items may interact with your medicine. What should I watch for while using this medicine? Your condition will be monitored carefully while you are receiving this medicine. Do not become pregnant while taking  this medicine or for 3 months after stopping it. Women should inform their healthcare professional if they wish to become pregnant or think they might be pregnant. There is a potential for serious side effects and harm to an unborn child. Talk to your healthcare professional for more information. Do not breast-feed an infant while taking this medicine. You may need blood work done while you are taking this medicine. What side effects may I notice from receiving this medicine? Side effects that you should report to your doctor or health care professional as soon as possible:  allergic reactions like skin rash, itching or hives; swelling of the face, lips, or tongue  signs and symptoms of a blood clot such as chest pain; shortness of breath; pain, swelling, or warmth in the leg  signs and symptoms of a stroke like changes in vision; confusion; trouble speaking or understanding; severe headaches; sudden numbness or weakness of the face, arm or leg; trouble walking; dizziness; loss of balance or coordination Side effects that usually do not require medical attention (report these to your doctor or health care professional if they continue or are bothersome):  cough  diarrhea  dizziness  headache  joint pain  stomach pain  tiredness This list may not describe all possible side effects. Call your doctor for medical advice about side effects. You may report side effects to FDA at 1-800-FDA-1088. Where should I keep my medicine? This medicine is given in a hospital or clinic and will not be stored at home. NOTE: This sheet is a summary. It may not cover all possible information. If you have questions about   this medicine, talk to your doctor, pharmacist, or health care provider.  2020 Elsevier/Gold Standard (2018-10-20 15:57:59)  

## 2019-12-27 ENCOUNTER — Encounter: Payer: Self-pay | Admitting: Nurse Practitioner

## 2019-12-30 ENCOUNTER — Telehealth (HOSPITAL_COMMUNITY): Payer: Self-pay | Admitting: *Deleted

## 2019-12-30 NOTE — Telephone Encounter (Signed)
Pt left VM c/o swollen ankles and fingers. Pt stated her weight is up 5lbs even though she's taking all medications as prescribed.pt asked if there were any medication changes she needed to make.  Routed to [place] for advice

## 2019-12-31 NOTE — H&P (Signed)
[facility] History & Physical  Rob Doctor, hospital MD   [place] Millon 09/14/2019  Chief Complaint: Cough and SOB HPI: The patient is a 64 yo female w/ hx of IDDM, HTN, CKD, pulm HTN gout and colon cancer (2008) who presents w/ 3 mos hx of cough, and 3 day hx of SOB w/ worsening cough now prodcutive of yellow and green phlegm.  No chest pain, no abd pain , slight nausea no vomiting and no fevers or chills.  In ED CXR shows +bilat infiltrates c/w PNA.  BP's are slightly high, 92% on 2L [place], HR 80 and RR 14-21. Labs show Na 134 BUN 49, creat 1.8 (b/l 1.8- 2.4).  WBC 27k, Hb 8.8 and plts wnl. Pt was given IV Rocephin. Asked to see for admisison.   Pt lives w/ husband in [place], worked as school teaching asst for 38 yrs, now retired.  Has CKD sees [facility] doctor regularly.  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   ROS  denies CP  no joint pain   no HA  no blurry vision  no rash  no diarrhea  no dysuria  no difficulty voiding  no change in urine color   Past Medical History  Past Medical History:  Diagnosis Date  . Acute kidney injury (nontraumatic) ([place])   . Acute renal failure (ARF) ([place]) 06/08/2018  . Cancer [facility]) 2008   Colon   . Diabetes mellitus without complication ([place])   . Gout 09/08/2018  . Hypertension   . Macrocytic anemia    Past Surgical History  Past Surgical History:  Procedure Laterality Date  . COLON SURGERY    . DEBRIDMENT OF DECUBITUS ULCER N/A 06/12/2018   Procedure: DEBRIDMENT OF DECUBITUS ULCER;  Surgeon: Wallace Going, DO;  Location: [facility];  Service: Plastics;  Laterality: N/A;  . IR FLUORO GUIDE PORT INSERTION RIGHT  04/02/2017  . IR REMOVAL TUN ACCESS W/ PORT W/O FL MOD SED  03/16/2019  . IR US GUIDE VASC ACCESS RIGHT  04/02/2017  . RIGHT HEART CATH N/A 04/23/2019   Procedure: RIGHT HEART CATH;  Surgeon: Jolaine Artist, MD;  Location: [place] [facility];  Service: Cardiovascular;   Laterality: N/A;  . RIGHT/LEFT HEART CATH AND CORONARY ANGIOGRAPHY N/A 05/21/2018   Procedure: RIGHT/LEFT HEART CATH AND CORONARY ANGIOGRAPHY;  Surgeon: Nelva Bush, MD;  Location: [place] CV LAB;  Service: Cardiovascular;  Laterality: N/A;   Family History  Family History  Problem Relation Age of Onset  . Hypertension Mother   . Diabetes Mother   . Cervical cancer Mother   . Heart attack Father   . Hypertension Sister   . Hypertension Brother   . Hypertension Sister   . Hypertension Sister   . Prostate cancer Brother   . HIV/AIDS Brother    Social History  reports that she quit smoking about 18 years ago. Her smoking use included cigarettes. She has a 5.00 pack-year smoking history. She has quit using smokeless tobacco. She reports current alcohol use of about 2.0 standard drinks of alcohol per week. She reports that she does not use drugs. Allergies  Allergies  Allergen Reactions  . Ancef [Cefazolin] Itching    Severe itching- after procedure, ancef was the antibiotic.-04/02/17 Tolerates penicillins  . Lactose Intolerance (Gi) Diarrhea  . Sulfa Antibiotics Rash and Other (See Comments)    Blisters, also   Home medications  Prior to Admission medications   Medication Sig Start Date End Date Taking? Authorizing Provider  allopurinol (ZYLOPRIM) 100 MG tablet Take 100 mg by mouth daily. 05/07/19  Yes [provider]  amiodarone (PACERONE) 200 MG tablet TAKE 1 TABLET BY MOUTH EVERY DAY 08/02/19  Yes Bensimhon, Shaune Pascal, MD  amLODipine (NORVASC) 10 MG tablet Take 1 tablet (10 mg total) by mouth daily. 02/15/19  Yes Bensimhon, Shaune Pascal, MD  colchicine 0.6 MG tablet TAKE 1 TABLET BY MOUTH TWICE A DAY AS NEEDED FOR GOUT 09/01/19  Yes [name], FNP  latanoprost (XALATAN) 0.005 % ophthalmic solution Place 1 drop into both eyes at bedtime.    Yes [provider]  Multiple Vitamins-Minerals (CENTRUM SILVER 50+WOMEN) TABS Take 1 tablet by mouth daily.   Yes [provider]  oxymetazoline (AFRIN) 0.05 % nasal spray Place 1 spray into both nostrils 2 (two) times daily as needed for congestion.   Yes [provider]  sildenafil (REVATIO) 20 MG tablet Take 1 tablet (20 mg total) by mouth 3 (three) times daily. 02/25/19  Yes Clegg, Amy D, NP  albuterol (PROAIR HFA) 108 (90 Base) MCG/ACT inhaler Inhale 2 puffs into the lungs every 6 (six) hours as needed for wheezing or shortness of breath. 12/07/19   [name], FNP  atorvastatin (LIPITOR) 40 MG tablet Take 1 tablet (40 mg total) by mouth daily. 12/16/19   [name], FNP  blood glucose meter kit and supplies Dispense based on patient and insurance preference. Use up to four times daily as directed. (FOR ICD-10 E10.9, E11.9). 11/04/18   [name], FNP  Blood Glucose Monitoring Suppl [facility] Karsten Fells) w/Device KIT Check blood sugars twice daily E11.9 11/30/19   [name], FNP  budesonide-formoterol [facility]) 80-4.5 MCG/ACT inhaler Inhale 2 puffs into the lungs in the morning and at bedtime. 12/14/19   [name], FNP  cloNIDine (CATAPRES) 0.1 MG tablet Take 1 tablet (0.1 mg total) by mouth 2 (two) times daily. 11/30/19   Bensimhon, Shaune Pascal, MD  doxazosin (CARDURA) 1 MG tablet Take 2 mg by mouth at bedtime.    [provider]  doxycycline (MONODOX) 100 MG capsule Take 1 capsule (100 mg total) by mouth 2 (two) times daily. 12/07/19   [name], FNP  furosemide (LASIX) 40 MG tablet TAKE 1 TABLET BY MOUTH EVERY DAY 11/01/19   [name], FNP  glucose blood test strip Use as instructed 11/30/19   [name], FNP  Insulin Pen Needle 29G X 5MM MISC Use as directed 06/16/17   Bonnielee Haff, MD  Lancets (ONETOUCH DELICA PLUS [id number]) MISC Use as instructed to check blood sugars twice daily 12/23/19   [name], FNP  Lancets [facility] ULTRASOFT) lancets Use as instructed to check blood sugars twice daily E11.9 12/14/19   [name], FNP  levothyroxine (SYNTHROID) 88 MCG  tablet TAKE 1 TABLET BY MOUTH EVERY DAY 10/15/19   [name], FNP  linagliptin (TRADJENTA) 5 MG TABS tablet Take 1 tablet (5 mg total) by mouth daily. 11/30/19   [name], FNP       Exam Gen alert, masked, no distress, calm No rash, cyanosis or gangrene Sclera anicteric, throat clear  No jvd or bruits Chest soft bilat basilar crackles L > R, no bronch BS RRR no MRG Abd soft ntnd no mass or ascites +bs GU defer MS no joint effusions or deformity Ext no pretib or hip edema, no wounds or ulcers Neuro is alert, Ox 3 , nf   -  norvasc 10/ hydral 100 tid/ lasix 40 qd/ cardura 46m hs  - sildenafil 20 tid  - insulin glargine 5u bid prn/ januvia  - allopurinol 100/ colchicine 0.6 qd  - lipitor 40   - amiodarone 200 qd  - prn's/ vitamins/ supplements    Assessment/ Plan: 1. Pneumonia - comm acquired w/ bilat infiltrates, ^wBC, purulent sputum. Plan is to admit, O2 prn, IV abx for CAP w/ rocephin / azithro.  2. CKD IV - b/l creat 1.8 - 2.4.  Creat 1.8 today, no vol excess, looks euvolemic. BP's up a bit. Follow. 3. HTN - cont home BP meds, BP's a little high, cont home meds including lasix 4. IDDM - just give SSI here 5. pHTN - cont sildenafil 6. Atrial fib - cont amio 7. Gout - cont meds 8. H/o colon cancer - in 2008     RKelly Splinter MD   [facility] 09/14/2019, 2:20 PM

## 2020-01-06 NOTE — Telephone Encounter (Signed)
Sorry, I missed this. Is she improved now?

## 2020-01-10 ENCOUNTER — Other Ambulatory Visit: Payer: Self-pay

## 2020-01-10 ENCOUNTER — Ambulatory Visit (INDEPENDENT_AMBULATORY_CARE_PROVIDER_SITE_OTHER): Payer: BC Managed Care – PPO | Admitting: Cardiovascular Disease

## 2020-01-10 ENCOUNTER — Encounter: Payer: Self-pay | Admitting: Cardiovascular Disease

## 2020-01-10 VITALS — BP 166/68 | HR 60 | Ht 64.0 in | Wt 194.0 lb

## 2020-01-10 DIAGNOSIS — I10 Essential (primary) hypertension: Secondary | ICD-10-CM | POA: Diagnosis not present

## 2020-01-10 DIAGNOSIS — I272 Pulmonary hypertension, unspecified: Secondary | ICD-10-CM

## 2020-01-10 DIAGNOSIS — I4891 Unspecified atrial fibrillation: Secondary | ICD-10-CM

## 2020-01-10 DIAGNOSIS — I5031 Acute diastolic (congestive) heart failure: Secondary | ICD-10-CM | POA: Diagnosis not present

## 2020-01-10 DIAGNOSIS — I350 Nonrheumatic aortic (valve) stenosis: Secondary | ICD-10-CM

## 2020-01-10 MED ORDER — HYDRALAZINE HCL 50 MG PO TABS
50.0000 mg | ORAL_TABLET | Freq: Three times a day (TID) | ORAL | 3 refills | Status: DC
Start: 2020-01-10 — End: 2020-02-22

## 2020-01-10 NOTE — Progress Notes (Signed)
Hypertension Clinic Initial Assessment:    Date:  01/10/2020   ID:  Patricia Mata, DOB May 30, 1956, MRN [id number]  PCP:  [name], FNP  Cardiologist:  Quay Burow, MD  Nephrologist:  Referring MD: Jolaine Artist, MD   CC: Hypertension  History of Present Illness:    Patricia Mata is a 64 y.o. female with a hx of severe aortic stenosis, moderate mitral stenosis, pulmonary hypertension, paroxysmal atrial fibrillation,  hypertension, diabetes, myelodysplasitc syndrome, transfusion dependent anemia,  and prior colon cancer s/p hemicolectomy here to establish care in the hypertension clinic.   [name] was admitted with atrial fibrillation with RVR and recurrent anemia 04/2018.  She had hypoxic respiratory failure requiring intubation.  She was transfused and treated with IV amiodarone.  She subsequently converted to sinus rhythm on IV amiodarone.  She had acute renal failure requiring CVVHD but has not needed HD as an outpatient.  Echo at that time revealed LVEF 65 to 70% with grade 2 diastolic dysfunction.  She had moderate aortic stenosis with a mean gradient of 22 mmHg.  PASP was 93 mmHg.  She was not thought to be a candidate for intervention on her aortic or mitral stenosis due to severe pulmonary hypertension and transfusion dependent myelodysplasia.  Left heart catheterization in 05/2018 revealed normal coronaries and severely elevated pulmonary pressures.  V/Q scan was negative for pulmonary embolism.  She was started on sildenafil and diuresed with IV Lasix.  She continues to require transfusions as an outpatient.   She was treated for COVID 03/2019 and feels as though she has recovered.  Repeat right heart catheterization 04/23/2019 revealed mild pulmonary hypertension (RA 8, RV 53/3, PA 57/12, mean PA of 32, pulmonary capillary wedge 14, PVR 2.1 Wood units, cardiac output 8.4, cardiac index 4.5).  [name] was admitted for pneumonia 09/2019 and treated with  antibiotics.  She was discharged and returned 3 days later for hypoxic respiratory failure requiring intubation.  She was negative for Covid and chest CT was consistent with multifocal pneumonia.  Acute on chron heart failure was also thought to be contributing.  She was again diuresed and treated with IV antibiotics.  She last saw Dr. Haroldine Laws 11/2019 and her BP was 210/64.  She was started on clonidine 1mg  bid.  Lately her BP has been in the 150-160s.  It is often 180 prior to taking her medication.  She notes that she was first diagnosed with hypertension in her 56s and it was initially well-controlled.  Lately she has been feeling well.  She is still recovering from her hospitalization but is starting to exercise more.  She walks for exericse and uses an exercise bike two to three days per week.  She denies exertional chest pain or shortness of breath but she gets tired quickly.  She can only exercise for about 5 minutes at a time.  She has some lower extremity edema that has improved with Lasix.  She weighs her self daily and has been stable to decreasing in weight.  She has been limiting her portions and this is helped her to lose weight.  She has no orthopnea or PND.  She has noted a persistent wheeze and cough and is planning to see pulmonary soon.  She tries to limit her salt intake.  Her husband does all the cooking and does not add salt.  She drinks 1 cup of Coffee daily and very rarely has alcohol intake.  She does not use any over-the-counter herbs  or supplements.  Previous antihypertensives: Hydralazine- unsure why stopped   Past Medical History:  Diagnosis Date  . Acute kidney injury (nontraumatic) ([place])   . Acute renal failure (ARF) ([place]) 06/08/2018  . Cancer [facility]) 2008   Colon   . Diabetes mellitus without complication ([place])   . Gout 09/08/2018  . Hypertension   . Macrocytic anemia     Past Surgical History:  Procedure Laterality Date  . COLON SURGERY    . DEBRIDMENT OF DECUBITUS  ULCER N/A 06/12/2018   Procedure: DEBRIDMENT OF DECUBITUS ULCER;  Surgeon: Wallace Going, DO;  Location: [facility];  Service: Plastics;  Laterality: N/A;  . IR FLUORO GUIDE PORT INSERTION RIGHT  04/02/2017  . IR REMOVAL TUN ACCESS W/ PORT W/O FL MOD SED  03/16/2019  . IR US GUIDE VASC ACCESS RIGHT  04/02/2017  . RIGHT HEART CATH N/A 04/23/2019   Procedure: RIGHT HEART CATH;  Surgeon: Jolaine Artist, MD;  Location: [place] [facility];  Service: Cardiovascular;  Laterality: N/A;  . RIGHT/LEFT HEART CATH AND CORONARY ANGIOGRAPHY N/A 05/21/2018   Procedure: RIGHT/LEFT HEART CATH AND CORONARY ANGIOGRAPHY;  Surgeon: Nelva Bush, MD;  Location: [place] [facility];  Service: Cardiovascular;  Laterality: N/A;    Current Medications: No outpatient medications have been marked as taking for the 01/10/20 encounter (Appointment) with Skeet Latch, MD.     Allergies:   Ancef [cefazolin], Lactose intolerance (gi), and Sulfa antibiotics   Social History   Socioeconomic History  . Marital status: Married    Spouse name: Not on file  . Number of children: Not on file  . Years of education: Not on file  . Highest education level: Not on file  Occupational History  . Not on file  Tobacco Use  . Smoking status: Former Smoker    Packs/day: 0.25    Years: 20.00    Pack years: 5.00    Types: Cigarettes    Quit date: 01/31/2001    Years since quitting: 18.9  . Smokeless tobacco: Former Network engineer  . Vaping Use: Never used  Substance and Sexual Activity  . Alcohol use: Yes    Alcohol/week: 2.0 standard drinks    Types: 2 Shots of liquor per week  . Drug use: Never  . Sexual activity: Not on file  Other Topics Concern  . Not on file  Social History Narrative  . Not on file   Social Determinants of Health   Financial Resource Strain:   . Difficulty of Paying Living Expenses:   Food Insecurity:   . Worried About Charity fundraiser in the Last Year:   . Arboriculturist in  the Last Year:   Transportation Needs:   . Film/video editor (Medical):   Marland Kitchen Lack of Transportation (Non-Medical):   Physical Activity:   . Days of Exercise per Week:   . Minutes of Exercise per Session:   Stress:   . Feeling of Stress :   Social Connections:   . Frequency of Communication with Friends and Family:   . Frequency of Social Gatherings with Friends and Family:   . Attends Religious Services:   . Active Member of Clubs or Organizations:   . Attends Archivist Meetings:   Marland Kitchen Marital Status:      Family History: The patient's family history includes Cervical cancer in her mother; Diabetes in her mother; HIV/AIDS in her brother; Heart attack in her father; Hypertension in her brother,  mother, sister, sister, and sister; Prostate cancer in her brother.  ROS:   Please see the history of present illness.    All other systems reviewed and are negative.  EKGs/Labs/Other Studies Reviewed:    EKG:  EKG is not ordered today.  The ekg ordered 09/27/19 demonstrates sinus rhythm.  Rate 74 bpm.  QTc 470 ms.  ?LVH.  Nonspecific T wave abnormalities   Echo 05/12/18: Study Conclusions   - Left ventricle: The cavity size was normal. There was moderate  concentric hypertrophy. Systolic function was vigorous. The  estimated ejection fraction was in the range of 65% to 70%. Wall  motion was normal; there were no regional wall motion  abnormalities. Features are consistent with a pseudonormal left  ventricular filling pattern, with concomitant abnormal relaxation  and increased filling pressure (grade 2 diastolic dysfunction).  Doppler parameters are consistent with high ventricular filling  pressure.  - Aortic valve: Trileaflet; moderately thickened, moderately  calcified leaflets. Valve mobility was restricted. There was  moderate stenosis. Mean gradient (S): 22 mm Hg. VTI ratio of LVOT  to aortic valve: 0.37. Valve area (VTI): 1.28 cm^2. Valve  area  (Vmax): 1.01 cm^2. Valve area (Vmean): 1.18 cm^2.  - Mitral valve: Calcified annulus. Moderate diffuse thickening,  calcification, and elongation. The findings are consistent with  mild to moderate stenosis. Deceleration time: 324 ms.  - Tricuspid valve: There was moderate regurgitation.  - Pulmonic valve: There was trivial regurgitation.  - Pulmonary arteries: PA peak pressure: 93 mm Hg (S).  Echo 09/21/19: 1. Left ventricular ejection fraction, by estimation, is 65 to 70%. The  left ventricle has hyperdynamic function. The left ventricle has no  regional wall motion abnormalities. Left ventricular diastolic parameters  are consistent with Grade II diastolic  dysfunction (pseudonormalization). Elevated left atrial pressure.  2. Right ventricular systolic function is normal. The right ventricular  size is normal. There is moderately elevated pulmonary artery systolic  pressure.  3. Left atrial size was severely dilated.  4. The mitral valve is rheumatic. Mild to moderate mitral valve  regurgitation. Mild to moderate mitral stenosis. The mean mitral valve  gradient is 9.4 mmHg with average heart rate of 81 bpm.  5. The aortic valve has an indeterminant number of cusps. Aortic valve  regurgitation is trivial. Severe aortic valve stenosis. Aortic valve area,  by VTI measures 0.82 cm. Aortic valve mean gradient measures 42.0 mmHg.  Aortic valve Vmax measures 4.32  m/s.  6. The inferior vena cava is normal in size with greater than 50%  respiratory variability, suggesting right atrial pressure of 3 mmHg.  7. Agitated saline contrast bubble study was negative, with no evidence  of any interatrial shunt.   Comparison(s): Prior images reviewed side by side. Changes from prior  study are noted. Increased mitral gradients are partly explained by  increased heart rate and cardiac output.   LHC/RHC 05/21/18: Conclusions: 1. No angiographically significant coronary artery  disease. 2. Severe pulmonary hypertension (mean PAP 62 mmHg; PVR 5.4 WU). 3. Mildly elevated left heart filling pressure. 4. Moderately elevated right heart filling pressure.  Right atrial waveform has an "M" or "W" appearance that suggests restrictive/contrictive physiology. 5. Moderate aortic stenosis. 6. Probably moderate mitral stenosis.  Transmitral gradient (12 mmHg) may be accentuated by mitral regurgitation and anemia.  Prominent v-waves also suggest a degree of mitral regurgitation. 7. Normal to supranormal Fick cardiac output/index.  Recommendations: 1. Anticipate restarting diuresis tomorrow, as renal function allows following contrast administration today. 2.  Consider advanced heart failure consultation to assist with management of HFpEF and severe pulmonary hypertension. I do not believe that valvular disease is the primary cause of the patient's symptoms. 3. Primary prevention of coronary artery disease. RA (mean): 14 mmHg RV (S/EDP): 110/15 mmHg PA (S/D, mean): 105/40 (62) mmHg PCWP (mean): 25 mmHg Ao sat: 91% PA sat: 53 Fick CO: 6.8 L/min Fick CI: 4.3 L/min/m^2 PVR: 5.4 Wood units  RHC 10/20 RA = 8 RV = 53/3 PA = 57/12 (32) PCW = 14 (v waves to 32) Fick cardiac output/index = 8.4/4.5 PVR = 2.1 WU Ao sat = 99% PA sat = 73%, 74% High SVC sat = 74%   Recent Labs: 09/20/2019: B Natriuretic Peptide 548.2 09/28/2019: Magnesium 1.6 12/07/2019: TSH 5.730 12/21/2019: ALT 11; BUN 51; Creatinine, Ser 2.30; Hemoglobin 6.4; Platelet Count 179; Potassium 4.2; Sodium 137   Recent Lipid Panel    Component Value Date/Time   CHOL 143 03/20/2019 0406   TRIG 108 09/21/2019 2336   HDL 48 03/20/2019 0406   CHOLHDL 3.0 03/20/2019 0406   VLDL 9 03/20/2019 0406   LDLCALC 86 03/20/2019 0406    Physical Exam:    VS:  BP (!) 166/68   Pulse 60   Ht 5\' 4"  (1.626 m)   Wt 194 lb (88 kg)   SpO2 98%   BMI 33.30 kg/m  , BMI Body mass index is 33.3 kg/m. GENERAL:  Well  appearing HEENT: Pupils equal round and reactive, fundi not visualized, oral mucosa unremarkable NECK:  No jugular venous distention, waveform within normal limits, carotid upstroke brisk and symmetric, no bruits, no thyromegaly LYMPHATICS:  No cervical adenopathy LUNGS:  Clear to auscultation bilaterally HEART:  RRR.  PMI not displaced or sustained,S1 and S2 within normal limits, no S3, no S4, no clicks, no rubs, III/VI late-peaking systolic murmur at the LUSB.  ABD:  Flat, positive bowel sounds normal in frequency in pitch, no bruits, no rebound, no guarding, no midline pulsatile mass, no hepatomegaly, no splenomegaly EXT:  2 plus pulses throughout, no edema, no cyanosis no clubbing SKIN:  No rashes no nodules NEURO:  Cranial nerves II through XII grossly intact, motor grossly intact throughout PSYCH:  Cognitively intact, oriented to person place and time   ASSESSMENT:    No diagnosis found.  PLAN:    # Essential hypertension:  Ms. Pietro blood pressure was elevated both initially and on repeat.  It is improved somewhat since the addition of clonidine but remains above goal.  She notes that her mouth is quite dry since starting the clonidine.  Therefore we will not titrate the dose.  Is unclear why hydralazine was stopped during her hospitalization.  We will add it back at 50 mg 3 times daily.  She will continue amlodipine 10 mg daily, doxazosin, and Lasix.  She was given a hypertension clinic booklet and will track her blood pressures at home.  We will also check renal artery Dopplers to assess for renal artery stenosis.  Her thyroid has been mildly abnormal but her levothyroxine has already been adjusted.  She is interested in prep program with the [facility].  However she is unsure whether she will be able to arrange transportation.  We will asked them to call her to see if she may be a candidate. She consents to be monitored in our remote patient monitoring program through [facility].  She will track  his blood pressure twice daily and understands that these trends will help [place] to adjust  her medications as needed prior to his next appointment.  Avoiding ACE inhibitor's and ARB is due to her chronic kidney disease previously requiring CVVHD.  Secondary Causes of Hypertension  Medications/Herbal: OCP, steroids, stimulants, antidepressants, weight loss medication, immune suppressants, NSAIDs, sympathomimetics, alcohol, caffeine, licorice, ginseng, St. John's wort, chemo (none apply)  Sleep Apnea (no symptoms)  Renal artery stenosis (ordering renal artery Dopplers) Hyperaldosteronism (testing not indicated) Hyper/hypothyroidism (mildly hypothyroid 11/2019- meds adjusted) Pheochromocytoma: (testing not indicated) Cushing's syndrome:(testing not indicated) Coarctation of the aorta  # PAH: Pulmonary pressure improved significantly with the addition of sildenafil.  Her volume status is quite stable.  Continue furosemide and sildenafil as ordered by Dr. Haroldine Laws.  Of course, sildenafil is being used cautiously in the setting of moderate to severe aortic stenosis.  #Moderate to severe aortic stenosis, mitral stenosis: Managed by Drs. Britta Mccreedy on and read as above.  Volume status stable.  Not a candidate for intervention due to her anemia requiring recurrent transfusions.  #Paroxysmal atrial fibrillation: Continues to maintain sinus rhythm on amiodarone.  Not a candidate for anticoagulation due to chronic transfusion dependent anemia as above.   Disposition:    FU with MD/PharmD in 1 month    Medication Adjustments/Labs and Tests Ordered: Current medicines are reviewed at length with the patient today.  Concerns regarding medicines are outlined above.  No orders of the defined types were placed in this encounter.  No orders of the defined types were placed in this encounter.    Signed, Skeet Latch, MD  01/10/2020 1:35 PM    [facility]

## 2020-01-10 NOTE — Patient Instructions (Addendum)
Medication Instructions:  START HYDRALAZINE 50 MG THREE TIMES A DAY    Labwork: NONE    Testing/Procedures: Your physician has requested that you have a renal artery duplex. During this test, an ultrasound is used to evaluate blood flow to the kidneys. Allow one hour for this exam. Do not eat after midnight the day before and avoid carbonated beverages. Take your medications as you usually do.   Follow-Up: 02/15/2020 AT 2:00 PM WITH PHARM D    You will receive a phone call from the PREP exercise and nutrition program to POSSIBLY schedule an initial assessment.  Special Instructions:  MONITOR YOUR BLOOD PRESSURE TWICE A DAY WITH THE VIVIFY APP/MACHINE  CALL VIVIFY [phone] IF YOU CAN NOT GET YOUR MONITOR TO LINK WITH YOUR PHONE AT HOME   DASH Eating Plan DASH stands for "Dietary Approaches to Stop Hypertension." The DASH eating plan is a healthy eating plan that has been shown to reduce high blood pressure (hypertension). It may also reduce your risk for type 2 diabetes, heart disease, and stroke. The DASH eating plan may also help with weight loss. What are tips for following this plan?  General guidelines  Avoid eating more than 2,300 mg (milligrams) of salt (sodium) a day. If you have hypertension, you may need to reduce your sodium intake to 1,500 mg a day.  Limit alcohol intake to no more than 1 drink a day for nonpregnant women and 2 drinks a day for men. One drink equals 12 oz of beer, 5 oz of wine, or 1 oz of hard liquor.  Work with your health care provider to maintain a healthy body weight or to lose weight. Ask what an ideal weight is for you.  Get at least 30 minutes of exercise that causes your heart to beat faster (aerobic exercise) most days of the week. Activities may include walking, swimming, or biking.  Work with your health care provider or diet and nutrition specialist (dietitian) to adjust your eating plan to your individual calorie needs. Reading food  labels   Check food labels for the amount of sodium per serving. Choose foods with less than 5 percent of the Daily Value of sodium. Generally, foods with less than 300 mg of sodium per serving fit into this eating plan.  To find whole grains, look for the word "whole" as the first word in the ingredient list. Shopping  Buy products labeled as "low-sodium" or "no salt added."  Buy fresh foods. Avoid canned foods and premade or frozen meals. Cooking  Avoid adding salt when cooking. Use salt-free seasonings or herbs instead of table salt or sea salt. Check with your health care provider or pharmacist before using salt substitutes.  Do not fry foods. Cook foods using healthy methods such as baking, boiling, grilling, and broiling instead.  Cook with heart-healthy oils, such as olive, canola, soybean, or sunflower oil. Meal planning  Eat a balanced diet that includes: ? 5 or more servings of fruits and vegetables each day. At each meal, try to fill half of your plate with fruits and vegetables. ? Up to 6-8 servings of whole grains each day. ? Less than 6 oz of lean meat, poultry, or fish each day. A 3-oz serving of meat is about the same size as a deck of cards. One egg equals 1 oz. ? 2 servings of low-fat dairy each day. ? A serving of nuts, seeds, or beans 5 times each week. ? Heart-healthy fats. Healthy fats called Omega-3 fatty  acids are found in foods such as flaxseeds and coldwater fish, like sardines, salmon, and mackerel.  Limit how much you eat of the following: ? Canned or prepackaged foods. ? Food that is high in trans fat, such as fried foods. ? Food that is high in saturated fat, such as fatty meat. ? Sweets, desserts, sugary drinks, and other foods with added sugar. ? Full-fat dairy products.  Do not salt foods before eating.  Try to eat at least 2 vegetarian meals each week.  Eat more home-cooked food and less restaurant, buffet, and fast food.  When eating at a  restaurant, ask that your food be prepared with less salt or no salt, if possible. What foods are recommended? The items listed may not be a complete list. Talk with your dietitian about what dietary choices are best for you. Grains Whole-grain or whole-wheat bread. Whole-grain or whole-wheat pasta. Brown rice. [place] Morrow. Bulgur. Whole-grain and low-sodium cereals. Pita bread. Low-fat, low-sodium crackers. Whole-wheat flour tortillas. Vegetables Fresh or frozen vegetables (raw, steamed, roasted, or grilled). Low-sodium or reduced-sodium tomato and vegetable juice. Low-sodium or reduced-sodium tomato sauce and tomato paste. Low-sodium or reduced-sodium canned vegetables. Fruits All fresh, dried, or frozen fruit. Canned fruit in natural juice (without added sugar). Meat and other protein foods Skinless chicken or [place]. Ground chicken or [place]. Pork with fat trimmed off. Fish and seafood. Egg whites. Dried beans, peas, or lentils. Unsalted nuts, nut butters, and seeds. Unsalted canned beans. Lean cuts of beef with fat trimmed off. Low-sodium, lean deli meat. Dairy Low-fat (1%) or fat-free (skim) milk. Fat-free, low-fat, or reduced-fat cheeses. Nonfat, low-sodium ricotta or cottage cheese. Low-fat or nonfat yogurt. Low-fat, low-sodium cheese. Fats and oils Soft margarine without trans fats. Vegetable oil. Low-fat, reduced-fat, or light mayonnaise and salad dressings (reduced-sodium). Canola, safflower, olive, soybean, and sunflower oils. Avocado. Seasoning and other foods Herbs. Spices. Seasoning mixes without salt. Unsalted popcorn and pretzels. Fat-free sweets. What foods are not recommended? The items listed may not be a complete list. Talk with your dietitian about what dietary choices are best for you. Grains Baked goods made with fat, such as croissants, muffins, or some breads. Dry pasta or rice meal packs. Vegetables Creamed or fried vegetables. Vegetables in a cheese sauce.  Regular canned vegetables (not low-sodium or reduced-sodium). Regular canned tomato sauce and paste (not low-sodium or reduced-sodium). Regular tomato and vegetable juice (not low-sodium or reduced-sodium). Angie Fava. Olives. Fruits Canned fruit in a light or heavy syrup. Fried fruit. Fruit in cream or butter sauce. Meat and other protein foods Fatty cuts of meat. Ribs. Fried meat. Berniece Salines. Sausage. Bologna and other processed lunch meats. Salami. Fatback. Hotdogs. Bratwurst. Salted nuts and seeds. Canned beans with added salt. Canned or smoked fish. Whole eggs or egg yolks. Chicken or [place] with skin. Dairy Whole or 2% milk, cream, and half-and-half. Whole or full-fat cream cheese. Whole-fat or sweetened yogurt. Full-fat cheese. Nondairy creamers. Whipped toppings. Processed cheese and cheese spreads. Fats and oils Butter. Stick margarine. Lard. Shortening. Ghee. Bacon fat. Tropical oils, such as coconut, palm kernel, or palm oil. Seasoning and other foods Salted popcorn and pretzels. Onion salt, garlic salt, seasoned salt, table salt, and sea salt. Worcestershire sauce. Tartar sauce. Barbecue sauce. Teriyaki sauce. Soy sauce, including reduced-sodium. Steak sauce. Canned and packaged gravies. Fish sauce. Oyster sauce. Cocktail sauce. Horseradish that you find on the shelf. Ketchup. Mustard. Meat flavorings and tenderizers. Bouillon cubes. Hot sauce and Tabasco sauce. Premade or packaged marinades. Premade or packaged  taco seasonings. Relishes. Regular salad dressings. Where to find more information:  National Heart, Lung, and [place]: https://wilson-eaton.com/  American Heart Association: www.heart.org Summary  The DASH eating plan is a healthy eating plan that has been shown to reduce high blood pressure (hypertension). It may also reduce your risk for type 2 diabetes, heart disease, and stroke.  With the DASH eating plan, you should limit salt (sodium) intake to 2,300 mg a day. If you have  hypertension, you may need to reduce your sodium intake to 1,500 mg a day.  When on the DASH eating plan, aim to eat more fresh fruits and vegetables, whole grains, lean proteins, low-fat dairy, and heart-healthy fats.  Work with your health care provider or diet and nutrition specialist (dietitian) to adjust your eating plan to your individual calorie needs. This information is not intended to replace advice given to you by your health care provider. Make sure you discuss any questions you have with your health care provider. Document Released: 06/20/2011 Document Revised: 06/13/2017 Document Reviewed: 06/24/2016 Elsevier Patient Education  2020 [employer].

## 2020-01-11 ENCOUNTER — Encounter: Payer: Self-pay | Admitting: Cardiovascular Disease

## 2020-01-11 ENCOUNTER — Other Ambulatory Visit: Payer: Self-pay

## 2020-01-11 ENCOUNTER — Inpatient Hospital Stay: Payer: Medicare Other

## 2020-01-11 DIAGNOSIS — D469 Myelodysplastic syndrome, unspecified: Secondary | ICD-10-CM

## 2020-01-11 DIAGNOSIS — D631 Anemia in chronic kidney disease: Secondary | ICD-10-CM

## 2020-01-11 DIAGNOSIS — N189 Chronic kidney disease, unspecified: Secondary | ICD-10-CM

## 2020-01-11 LAB — CMP (CANCER CENTER ONLY)
ALT: 12 U/L (ref 0–44)
AST: 21 U/L (ref 15–41)
Albumin: 3.4 g/dL — ABNORMAL LOW (ref 3.5–5.0)
Alkaline Phosphatase: 115 U/L (ref 38–126)
Anion gap: 10 (ref 5–15)
BUN: 56 mg/dL — ABNORMAL HIGH (ref 8–23)
CO2: 21 mmol/L — ABNORMAL LOW (ref 22–32)
Calcium: 8.9 mg/dL (ref 8.9–10.3)
Chloride: 104 mmol/L (ref 98–111)
Creatinine: 2.51 mg/dL — ABNORMAL HIGH (ref 0.44–1.00)
GFR, Est AFR Am: 23 mL/min — ABNORMAL LOW (ref >60)
GFR, Estimated: 19 mL/min — ABNORMAL LOW (ref >60)
Glucose, Bld: 163 mg/dL — ABNORMAL HIGH (ref 70–99)
Potassium: 4.9 mmol/L (ref 3.5–5.1)
Sodium: 135 mmol/L (ref 135–145)
Total Bilirubin: 0.9 mg/dL (ref 0.3–1.2)
Total Protein: 7.3 g/dL (ref 6.5–8.1)

## 2020-01-11 LAB — SAMPLE TO BLOOD BANK

## 2020-01-11 LAB — CBC WITH DIFFERENTIAL (CANCER CENTER ONLY)
Abs Immature Granulocytes: 0.05 10*3/uL (ref 0.00–0.07)
Basophils Absolute: 0 10*3/uL (ref 0.0–0.1)
Basophils Relative: 0 %
Eosinophils Absolute: 0.1 10*3/uL (ref 0.0–0.5)
Eosinophils Relative: 1 %
HCT: 24.5 % — ABNORMAL LOW (ref 36.0–46.0)
Hemoglobin: 8.3 g/dL — ABNORMAL LOW (ref 12.0–15.0)
Immature Granulocytes: 1 %
Lymphocytes Relative: 11 %
Lymphs Abs: 0.5 10*3/uL — ABNORMAL LOW (ref 0.7–4.0)
MCH: 31.4 pg (ref 26.0–34.0)
MCHC: 33.9 g/dL (ref 30.0–36.0)
MCV: 92.8 fL (ref 80.0–100.0)
Monocytes Absolute: 0.8 10*3/uL (ref 0.1–1.0)
Monocytes Relative: 15 %
Neutro Abs: 3.6 10*3/uL (ref 1.7–7.7)
Neutrophils Relative %: 72 %
Platelet Count: 197 10*3/uL (ref 150–400)
RBC: 2.64 MIL/uL — ABNORMAL LOW (ref 3.87–5.11)
RDW: 19.7 % — ABNORMAL HIGH (ref 11.5–15.5)
WBC Count: 5.1 10*3/uL (ref 4.0–10.5)
nRBC: 1 % — ABNORMAL HIGH (ref 0.0–0.2)

## 2020-01-11 LAB — PREPARE RBC (CROSSMATCH)

## 2020-01-11 NOTE — Progress Notes (Signed)
Received a call from the lab reporting patient's hemoglobin is 8.3. Dr. Alen Blew made aware and per Dr. Alen Blew patient to receive one unit of blood. Spoke to [name] in blood bank to confirm blood orders received. Patient is aware that she will receive one unit of blood tomorrow (appt 8 am) and not to remove blue blood bank bracelet.

## 2020-01-11 NOTE — Telephone Encounter (Signed)
Spoke with patient she stated her swelling is gone. Pt saw Dr.Randoplh for an office visit yesterday.

## 2020-01-12 ENCOUNTER — Ambulatory Visit: Payer: BC Managed Care – PPO

## 2020-01-12 ENCOUNTER — Other Ambulatory Visit: Payer: Self-pay

## 2020-01-12 ENCOUNTER — Other Ambulatory Visit: Payer: Self-pay | Admitting: Oncology

## 2020-01-12 ENCOUNTER — Inpatient Hospital Stay: Payer: Medicare Other

## 2020-01-12 VITALS — BP 173/54 | HR 48 | Temp 97.7°F | Resp 18

## 2020-01-12 DIAGNOSIS — D469 Myelodysplastic syndrome, unspecified: Secondary | ICD-10-CM

## 2020-01-12 MED ORDER — DENOSUMAB 120 MG/1.7ML ~~LOC~~ SOLN
SUBCUTANEOUS | Status: AC
  Filled 2020-01-12: qty 1.7

## 2020-01-12 MED ORDER — ACETAMINOPHEN 325 MG PO TABS
650.0000 mg | ORAL_TABLET | Freq: Once | ORAL | Status: AC
  Administered 2020-01-12: 650 mg via ORAL

## 2020-01-12 MED ORDER — FUROSEMIDE 10 MG/ML IJ SOLN
INTRAMUSCULAR | Status: AC
  Filled 2020-01-12: qty 2

## 2020-01-12 MED ORDER — SODIUM CHLORIDE 0.9% IV SOLUTION
250.0000 mL | Freq: Once | INTRAVENOUS | Status: AC
  Administered 2020-01-12: 250 mL via INTRAVENOUS
  Filled 2020-01-12: qty 250

## 2020-01-12 MED ORDER — DIPHENHYDRAMINE HCL 25 MG PO CAPS
25.0000 mg | ORAL_CAPSULE | Freq: Once | ORAL | Status: AC
  Administered 2020-01-12: 25 mg via ORAL

## 2020-01-12 MED ORDER — FUROSEMIDE 10 MG/ML IJ SOLN
20.0000 mg | Freq: Once | INTRAMUSCULAR | Status: AC
  Administered 2020-01-12: 20 mg via INTRAVENOUS

## 2020-01-12 MED ORDER — ACETAMINOPHEN 325 MG PO TABS
ORAL_TABLET | ORAL | Status: AC
  Filled 2020-01-12: qty 2

## 2020-01-12 MED ORDER — DIPHENHYDRAMINE HCL 25 MG PO CAPS
ORAL_CAPSULE | ORAL | Status: AC
  Filled 2020-01-12: qty 1

## 2020-01-12 MED ORDER — LUSPATERCEPT-AAMT 75 MG ~~LOC~~ SOLR
1.3300 mg/kg | Freq: Once | SUBCUTANEOUS | Status: AC
  Administered 2020-01-12: 115 mg via SUBCUTANEOUS
  Filled 2020-01-12: qty 2.3

## 2020-01-12 NOTE — Progress Notes (Signed)
Per Dr. Alen Blew, [facility] to treat with Reblozyl and serum creatinine 2.51.

## 2020-01-12 NOTE — Patient Instructions (Signed)

## 2020-01-13 ENCOUNTER — Encounter: Payer: Self-pay | Admitting: Nurse Practitioner

## 2020-01-13 LAB — TYPE AND SCREEN
ABO/RH(D): A NEG
Antibody Screen: NEGATIVE
Unit division: 0

## 2020-01-13 LAB — BPAM RBC
Blood Product Expiration Date: 202107152359
ISSUE DATE / TIME: 202106300900
Unit Type and Rh: 600

## 2020-01-14 ENCOUNTER — Telehealth: Payer: Self-pay

## 2020-01-14 NOTE — Telephone Encounter (Signed)
Call placed to patient reference referral to PREP from HTN office . Patient is interested-daytime would work best. Will need help with transportation. Assured her we can help with that.  Likely will be a M/W class 1-215pm starting in the next few weeks. Given my number. Will call pt back once dates are confirmed for intake.

## 2020-01-26 ENCOUNTER — Other Ambulatory Visit: Payer: Self-pay

## 2020-01-26 MED ORDER — BASAGLAR KWIKPEN 100 UNIT/ML ~~LOC~~ SOPN
12.0000 [IU] | PEN_INJECTOR | Freq: Every day | SUBCUTANEOUS | 1 refills | Status: DC
Start: 2020-01-26 — End: 2020-02-03

## 2020-01-28 ENCOUNTER — Ambulatory Visit (INDEPENDENT_AMBULATORY_CARE_PROVIDER_SITE_OTHER): Payer: BC Managed Care – PPO | Admitting: Emergency Medicine

## 2020-01-28 ENCOUNTER — Encounter: Payer: Self-pay | Admitting: Emergency Medicine

## 2020-01-28 ENCOUNTER — Other Ambulatory Visit: Payer: Self-pay

## 2020-01-28 ENCOUNTER — Telehealth: Payer: Self-pay

## 2020-01-28 DIAGNOSIS — J411 Mucopurulent chronic bronchitis: Secondary | ICD-10-CM | POA: Insufficient documentation

## 2020-01-28 DIAGNOSIS — Z8701 Personal history of pneumonia (recurrent): Secondary | ICD-10-CM

## 2020-01-28 DIAGNOSIS — R059 Cough, unspecified: Secondary | ICD-10-CM

## 2020-01-28 DIAGNOSIS — J849 Interstitial pulmonary disease, unspecified: Secondary | ICD-10-CM

## 2020-01-28 DIAGNOSIS — R05 Cough: Secondary | ICD-10-CM

## 2020-01-28 MED ORDER — ALBUTEROL SULFATE HFA 108 (90 BASE) MCG/ACT IN AERS: 2.0000 | INHALATION_SPRAY | Freq: Four times a day (QID) | RESPIRATORY_TRACT | 2 refills | Status: DC | PRN

## 2020-01-28 MED ORDER — LEVOFLOXACIN 500 MG PO TABS
500.0000 mg | ORAL_TABLET | Freq: Every day | ORAL | 0 refills | Status: DC
Start: 2020-01-28 — End: 2020-02-22

## 2020-01-28 MED ORDER — DM-GUAIFENESIN ER 30-600 MG PO TB12
1.0000 | ORAL_TABLET | Freq: Two times a day (BID) | ORAL | 0 refills | Status: DC
Start: 2020-01-28 — End: 2020-06-07

## 2020-01-28 MED ORDER — LORATADINE 10 MG PO TABS
10.0000 mg | ORAL_TABLET | Freq: Every day | ORAL | 11 refills | Status: DC
Start: 2020-01-28 — End: 2020-05-04

## 2020-01-28 MED ORDER — FLUTICASONE PROPIONATE 50 MCG/ACT NA SUSP
1.0000 | Freq: Every day | NASAL | 2 refills | Status: DC
Start: 2020-01-28 — End: 2021-01-14

## 2020-01-28 NOTE — Patient Instructions (Addendum)
Please take Levaquin 500 mg once a day for 7 days.  Take the whole prescription Please start loratadine 10mg  daily Please start fluticasone nasal spray,  Please start guaifenesin 600mg  twice a day until next visit.  Keep albuterol available to use 2 puffs up to every 4 hours if needed for shortness of breath, chest tightness, wheezing.  We will repeat your CT scan of the chest without contrast We will repeat your pulmonary function testing at your next office visit Follow with Dr. Lamonte Sakai next available with full pulmonary function testing on the same day.

## 2020-01-28 NOTE — Addendum Note (Signed)
Addended by: Gavin Potters R on: 01/28/2020 04:22 PM   Modules accepted: Orders

## 2020-01-28 NOTE — Assessment & Plan Note (Signed)
Longstanding, with purulent sputum for the last couple months.  She was treated in May with doxycycline without any response.  I will treat her with Levaquin to see if we get a better response with the additional gram-negative coverage.  Problem has been present since her COVID-19 diagnosis in 03/2019, question whether she may have evolved some bronchiectasis due to residual scar.  I will plan to repeat her CT scan of the chest to compare with March, repeat pulmonary function testing to assess for evolving obstructive lung disease given her history of tobacco and Covid pneumonitis.  Finally try to add an allergy regimen, mucolytic's to see if this will help with mucus burden.  Please take Levaquin 500 mg once a day for 7 days.  Take the whole prescription Please start loratadine 10mg  daily Please start fluticasone nasal spray,  Please start guaifenesin 600mg  twice a day until next visit.  Keep albuterol available to use 2 puffs up to every 4 hours if needed for shortness of breath, chest tightness, wheezing.  We will repeat your CT scan of the chest without contrast We will repeat your pulmonary function testing at your next office visit Follow with Dr. Lamonte Sakai next available with full pulmonary function testing on the same day.

## 2020-01-28 NOTE — Telephone Encounter (Signed)
Intake appt for PREP scheduled for 7/23 at 11am at [facility] requested for appt. Will sign waiver at appt.  Will need help with transport for classes.

## 2020-01-28 NOTE — Progress Notes (Signed)
Subjective:    Patient ID: Patricia Mata, female    DOB: 1955-09-27, 64 y.o.   MRN: [id number]  HPI 64 year old former smoker (10 pack years) with a history of diabetes, hypertension, severe AS, moderate MS, paroxysmal atrial fibrillation on amiodarone, secondary pulmonary hypertension (on sildenafil and diuretics), chronic renal insufficiency, myelodysplastic syndrome with transfusion dependent anemia, colon cancer with a history of hemicolectomy.  She has been admitted with acute respiratory failure requiring diuretics due to the above in 04/2018. She had COVID-19 pneumonia 03/2019.  She required intubation again in 09/2019 when she was admitted with acute community acquired pneumonia.  Her hypertension has been labile and adjustments in her regimen have been made in the last couple months.  She is referred for persistent cough, sputum production that is tan to brown, and wheeze. Got worse after COVID in 03/2019. Often hears wheeze at night. She is able to exert, cannot walk long distances. Cannot lift heavy objects. She is about to start cardiopulm, rehab. No significant sinus drainage, sometimes PND. She has hoarse voice, sometimes loses her voice. No GERD sx. She received doxy in May from her PCP, continued to have purulent mucous.   She was prescribed ProAir, but did not get it due to cost.   CT chest 09/21/2019 reviewed by me, showed diffuse intralobular septal thickening and patchy airspace disease consistent with acute lung injury/ARDS versus pulmonary edema.  Pulmonary function testing 02/25/2019 reviewed by me, show normal airflows, mildly restricted lung volumes, decreased diffusion capacity that corrects to normal range when just of alveolar volume.   Review of Systems As per HPI  Past Medical History:  Diagnosis Date  . Acute kidney injury (nontraumatic) ([place])   . Acute renal failure (ARF) ([place]) 06/08/2018  . Cancer [facility]) 2008   Colon   . Diabetes mellitus without  complication ([place])   . Gout 09/08/2018  . Hypertension   . Macrocytic anemia      Family History  Problem Relation Age of Onset  . Hypertension Mother   . Diabetes Mother   . Cervical cancer Mother   . Heart attack Father   . Hypertension Sister   . Hypertension Brother   . Hypertension Sister   . Hypertension Sister   . Prostate cancer Brother   . HIV/AIDS Brother      Social History   Socioeconomic History  . Marital status: Married    Spouse name: Not on file  . Number of children: Not on file  . Years of education: Not on file  . Highest education level: Not on file  Occupational History  . Not on file  Tobacco Use  . Smoking status: Former Smoker    Packs/day: 0.25    Years: 20.00    Pack years: 5.00    Types: Cigarettes    Quit date: 01/31/2001    Years since quitting: 19.0  . Smokeless tobacco: Former Network engineer  . Vaping Use: Never used  Substance and Sexual Activity  . Alcohol use: Yes    Alcohol/week: 2.0 standard drinks    Types: 2 Shots of liquor per week  . Drug use: Never  . Sexual activity: Not on file  Other Topics Concern  . Not on file  Social History Narrative  . Not on file   Social Determinants of Health   Financial Resource Strain:   . Difficulty of Paying Living Expenses:   Food Insecurity:   . Worried About Charity fundraiser in the  Last Year:   . [place] in the Last Year:   Transportation Needs:   . Film/video editor (Medical):   Marland Kitchen Lack of Transportation (Non-Medical):   Physical Activity:   . Days of Exercise per Week:   . Minutes of Exercise per Session:   Stress:   . Feeling of Stress :   Social Connections:   . Frequency of Communication with Friends and Family:   . Frequency of Social Gatherings with Friends and Family:   . Attends Religious Services:   . Active Member of Clubs or Organizations:   . Attends Archivist Meetings:   Marland Kitchen Marital Status:   Intimate Partner Violence:   . Fear  of Current or Ex-Partner:   . Emotionally Abused:   Marland Kitchen Physically Abused:   . Sexually Abused:      Allergies  Allergen Reactions  . Ancef [Cefazolin] Itching    Severe itching- after procedure, ancef was the antibiotic.-04/02/17 Tolerates penicillins  . Lactose Intolerance (Gi) Diarrhea  . Sulfa Antibiotics Rash and Other (See Comments)    Blisters, also     Outpatient Medications Prior to Visit  Medication Sig Dispense Refill  . albuterol (PROAIR HFA) 108 (90 Base) MCG/ACT inhaler Inhale 2 puffs into the lungs every 6 (six) hours as needed for wheezing or shortness of breath. 18 g 2  . allopurinol (ZYLOPRIM) 100 MG tablet Take 100 mg by mouth daily.    Marland Kitchen amiodarone (PACERONE) 200 MG tablet TAKE 1 TABLET BY MOUTH EVERY DAY 90 tablet 1  . amLODipine (NORVASC) 10 MG tablet Take 1 tablet (10 mg total) by mouth daily. 90 tablet 3  . atorvastatin (LIPITOR) 40 MG tablet Take 1 tablet (40 mg total) by mouth daily. 90 tablet 1  . blood glucose meter kit and supplies Dispense based on patient and insurance preference. Use up to four times daily as directed. (FOR ICD-10 E10.9, E11.9). 1 each 3  . Blood Glucose Monitoring Suppl (ONETOUCH ULTRALINK) w/Device KIT Check blood sugars twice daily E11.9 1 kit 0  . cloNIDine (CATAPRES) 0.1 MG tablet Take 1 tablet (0.1 mg total) by mouth 2 (two) times daily. 60 tablet 6  . colchicine 0.6 MG tablet TAKE 1 TABLET BY MOUTH TWICE A DAY AS NEEDED FOR GOUT 180 tablet 1  . doxazosin (CARDURA) 1 MG tablet Take 2 mg by mouth at bedtime.    . furosemide (LASIX) 40 MG tablet TAKE 1 TABLET BY MOUTH EVERY DAY 90 tablet 1  . glucose blood test strip Use as instructed 100 each 2  . hydrALAZINE (APRESOLINE) 50 MG tablet Take 1 tablet (50 mg total) by mouth 3 (three) times daily. 270 tablet 3  . Insulin Glargine (BASAGLAR KWIKPEN) 100 UNIT/ML Inject 0.12 mLs (12 Units total) into the skin daily. 1 pen 1  . Insulin Pen Needle 29G X 5MM MISC Use as directed 200 each 0  .  Lancets (ONETOUCH DELICA PLUS [id number]) MISC Use as instructed to check blood sugars twice daily 100 each 2  . Lancets (ONETOUCH ULTRASOFT) lancets Use as instructed to check blood sugars twice daily E11.9 100 each 12  . latanoprost (XALATAN) 0.005 % ophthalmic solution Place 1 drop into both eyes at bedtime.     Marland Kitchen levothyroxine (SYNTHROID) 88 MCG tablet TAKE 1 TABLET BY MOUTH EVERY DAY 90 tablet 1  . linagliptin (TRADJENTA) 5 MG TABS tablet Take 1 tablet (5 mg total) by mouth daily. 90 tablet 1  . Multiple Vitamins-Minerals (  CENTRUM SILVER 50+WOMEN) TABS Take 1 tablet by mouth daily.    Marland Kitchen oxymetazoline (AFRIN) 0.05 % nasal spray Place 1 spray into both nostrils 2 (two) times daily as needed for congestion.    . sildenafil (REVATIO) 20 MG tablet Take 1 tablet (20 mg total) by mouth 3 (three) times daily. 270 tablet 3   No facility-administered medications prior to visit.         Objective:   Physical Exam Vitals:   01/28/20 1519  BP: (!) 144/68  Pulse: 69  Temp: 98.4 F (36.9 C)  TempSrc: Oral  SpO2: 98%  Weight: 200 lb 6.4 oz (90.9 kg)  Height: '5\' 4"'  (1.626 m)   Gen: Pleasant, obese woman, in no distress,  normal affect  ENT: No lesions,  mouth clear,  oropharynx clear, no postnasal drip, some hoarse voice  Neck: No JVD, no stridor  Lungs: No use of accessory muscles, scattered bilateral rhonchi.  She coughs with a deep inspiration, productive cough with tan mucus  Cardiovascular: RRR, heart sounds normal, no murmur or gallops, no peripheral edema  Musculoskeletal: No deformities, no cyanosis or clubbing  Neuro: alert, awake, non focal  Skin: Warm, no lesions or rash      Assessment & Plan:  Purulent bronchitis (HCC) Longstanding, with purulent sputum for the last couple months.  She was treated in May with doxycycline without any response.  I will treat her with Levaquin to see if we get a better response with the additional gram-negative coverage.  Problem has been  present since her COVID-19 diagnosis in 03/2019, question whether she may have evolved some bronchiectasis due to residual scar.  I will plan to repeat her CT scan of the chest to compare with March, repeat pulmonary function testing to assess for evolving obstructive lung disease given her history of tobacco and Covid pneumonitis.  Finally try to add an allergy regimen, mucolytic's to see if this will help with mucus burden.  Please take Levaquin 500 mg once a day for 7 days.  Take the whole prescription Please start loratadine 54m daily Please start fluticasone nasal spray,  Please start guaifenesin 603mtwice a day until next visit.  Keep albuterol available to use 2 puffs up to every 4 hours if needed for shortness of breath, chest tightness, wheezing.  We will repeat your CT scan of the chest without contrast We will repeat your pulmonary function testing at your next office visit Follow with Dr. ByLamonte Sakaiext available with full pulmonary function testing on the same day.     RoBaltazar ApoMD, PhD 01/28/2020, 4:11 PM [facility] [phone] if no answer [phone]

## 2020-02-01 ENCOUNTER — Telehealth: Payer: Self-pay | Admitting: General Practice

## 2020-02-01 NOTE — Telephone Encounter (Signed)
Patient's [employer] blood pressure reviewed.  Has only had 1 entry since being seen in hypertension clinic.  We will continue to monitor for future entries.  Keep follow-up appointment 02/08/2020.

## 2020-02-02 ENCOUNTER — Other Ambulatory Visit: Payer: Self-pay

## 2020-02-02 ENCOUNTER — Inpatient Hospital Stay: Payer: Medicare Other

## 2020-02-02 ENCOUNTER — Telehealth: Payer: Self-pay | Admitting: General Practice

## 2020-02-02 ENCOUNTER — Inpatient Hospital Stay (HOSPITAL_BASED_OUTPATIENT_CLINIC_OR_DEPARTMENT_OTHER): Payer: Medicare Other | Admitting: Oncology

## 2020-02-02 ENCOUNTER — Inpatient Hospital Stay: Payer: Medicare Other | Attending: Oncology

## 2020-02-02 VITALS — BP 158/33 | HR 63 | Temp 98.1°F | Resp 18 | Ht 64.0 in | Wt 199.2 lb

## 2020-02-02 VITALS — BP 167/47 | HR 50 | Temp 97.9°F | Resp 16

## 2020-02-02 DIAGNOSIS — D469 Myelodysplastic syndrome, unspecified: Secondary | ICD-10-CM | POA: Insufficient documentation

## 2020-02-02 DIAGNOSIS — D631 Anemia in chronic kidney disease: Secondary | ICD-10-CM

## 2020-02-02 DIAGNOSIS — N189 Chronic kidney disease, unspecified: Secondary | ICD-10-CM

## 2020-02-02 LAB — CBC WITH DIFFERENTIAL (CANCER CENTER ONLY)
Abs Immature Granulocytes: 0.05 10*3/uL (ref 0.00–0.07)
Basophils Absolute: 0 10*3/uL (ref 0.0–0.1)
Basophils Relative: 0 %
Eosinophils Absolute: 0 10*3/uL (ref 0.0–0.5)
Eosinophils Relative: 1 %
HCT: 22.5 % — ABNORMAL LOW (ref 36.0–46.0)
Hemoglobin: 7.5 g/dL — ABNORMAL LOW (ref 12.0–15.0)
Immature Granulocytes: 1 %
Lymphocytes Relative: 10 %
Lymphs Abs: 0.6 10*3/uL — ABNORMAL LOW (ref 0.7–4.0)
MCH: 31.6 pg (ref 26.0–34.0)
MCHC: 33.3 g/dL (ref 30.0–36.0)
MCV: 94.9 fL (ref 80.0–100.0)
Monocytes Absolute: 1 10*3/uL (ref 0.1–1.0)
Monocytes Relative: 17 %
Neutro Abs: 4 10*3/uL (ref 1.7–7.7)
Neutrophils Relative %: 71 %
Platelet Count: 159 10*3/uL (ref 150–400)
RBC: 2.37 MIL/uL — ABNORMAL LOW (ref 3.87–5.11)
RDW: 22.8 % — ABNORMAL HIGH (ref 11.5–15.5)
WBC Count: 5.7 10*3/uL (ref 4.0–10.5)
nRBC: 1.1 % — ABNORMAL HIGH (ref 0.0–0.2)

## 2020-02-02 LAB — CMP (CANCER CENTER ONLY)
ALT: 11 U/L (ref 0–44)
AST: 17 U/L (ref 15–41)
Albumin: 3.4 g/dL — ABNORMAL LOW (ref 3.5–5.0)
Alkaline Phosphatase: 94 U/L (ref 38–126)
Anion gap: 10 (ref 5–15)
BUN: 46 mg/dL — ABNORMAL HIGH (ref 8–23)
CO2: 21 mmol/L — ABNORMAL LOW (ref 22–32)
Calcium: 9.1 mg/dL (ref 8.9–10.3)
Chloride: 103 mmol/L (ref 98–111)
Creatinine: 2.91 mg/dL — ABNORMAL HIGH (ref 0.44–1.00)
GFR, Est AFR Am: 19 mL/min — ABNORMAL LOW (ref >60)
GFR, Estimated: 16 mL/min — ABNORMAL LOW (ref >60)
Glucose, Bld: 205 mg/dL — ABNORMAL HIGH (ref 70–99)
Potassium: 4.8 mmol/L (ref 3.5–5.1)
Sodium: 134 mmol/L — ABNORMAL LOW (ref 135–145)
Total Bilirubin: 1.1 mg/dL (ref 0.3–1.2)
Total Protein: 7 g/dL (ref 6.5–8.1)

## 2020-02-02 LAB — SAMPLE TO BLOOD BANK

## 2020-02-02 LAB — PREPARE RBC (CROSSMATCH)

## 2020-02-02 MED ORDER — FUROSEMIDE 10 MG/ML IJ SOLN
20.0000 mg | Freq: Once | INTRAMUSCULAR | Status: AC
  Administered 2020-02-02: 20 mg via INTRAVENOUS

## 2020-02-02 MED ORDER — FUROSEMIDE 10 MG/ML IJ SOLN
INTRAMUSCULAR | Status: AC
  Filled 2020-02-02: qty 2

## 2020-02-02 MED ORDER — DIPHENHYDRAMINE HCL 25 MG PO CAPS
25.0000 mg | ORAL_CAPSULE | Freq: Once | ORAL | Status: AC
  Administered 2020-02-02: 25 mg via ORAL

## 2020-02-02 MED ORDER — SODIUM CHLORIDE 0.9% IV SOLUTION
250.0000 mL | Freq: Once | INTRAVENOUS | Status: AC
  Administered 2020-02-02: 250 mL via INTRAVENOUS
  Filled 2020-02-02: qty 250

## 2020-02-02 MED ORDER — ACETAMINOPHEN 325 MG PO TABS
ORAL_TABLET | ORAL | Status: AC
  Filled 2020-02-02: qty 2

## 2020-02-02 MED ORDER — ACETAMINOPHEN 325 MG PO TABS
650.0000 mg | ORAL_TABLET | Freq: Once | ORAL | Status: AC
  Administered 2020-02-02: 650 mg via ORAL

## 2020-02-02 MED ORDER — LUSPATERCEPT-AAMT 75 MG ~~LOC~~ SOLR
1.3300 mg/kg | Freq: Once | SUBCUTANEOUS | Status: AC
  Administered 2020-02-02: 115 mg via SUBCUTANEOUS
  Filled 2020-02-02: qty 1

## 2020-02-02 MED ORDER — DIPHENHYDRAMINE HCL 25 MG PO CAPS
ORAL_CAPSULE | ORAL | Status: AC
  Filled 2020-02-02: qty 1

## 2020-02-02 NOTE — Telephone Encounter (Signed)
Patient's blood pressures reviewed.  Last vivify entry 01/10/2020.   Michelle Brooom-please contact patient and inquire about questions or assistance that may be needed.  Thank you.  Helpdesk [phone]

## 2020-02-02 NOTE — Progress Notes (Signed)
Pt's creatnine today is 2.91, ok to receive reblozyl per Dr. Alen Blew.

## 2020-02-02 NOTE — Progress Notes (Signed)
Hematology and Oncology Follow Up Visit  Patricia Mata [id number] [birth year] 64 y.o. 02/02/2020 9:23 AM   Principle Diagnosis: 64 year old woman with Myelodysplastic syndrome diagnosed in 2017.  She presented with anemia and IPSS-R score of 2.5.  Secondary diagnosis: Stage III colon cancer diagnosed in 2008.  She has no evidence of relapse and presumably cured at this time.    Prior Therapy: She is S/P hemicolectomy done in 11/2006. 1/13 lymph nodes are positive.  This was followed by 12 cycles of FOLFOX completed in 05/2007  Aranesp 300 mcg every 2 to 3 weeks therapy was ineffective to eliminate transfusion needs.  5-azacytidine at 75 mg/m square subcutaneously started in October 2018.  She is status post 7 cycles of therapy completed in April 2019.   Current therapy:   Reblozyl 75 mg subcutaneous injection every 3 weeks started in December 2021.  She is receiving supportive transfusions as needed.    Interim History:  Patricia Mata is here for a follow-up visit.  Since last visit, she reports no major changes in her health.  She denies any excessive fatigue, tiredness or dyspnea on exertion.  Her mobility is reasonable but limited as she has to go for an extended period of time.  She denies any recent hospitalizations or illnesses.    Medication reviewed and updated. Current Outpatient Medications on File Prior to Visit  Medication Sig Dispense Refill  . allopurinol (ZYLOPRIM) 100 MG tablet Take 100 mg by mouth daily.    Marland Kitchen amiodarone (PACERONE) 200 MG tablet Take 200 mg by mouth daily.    Marland Kitchen amLODipine (NORVASC) 10 MG tablet Take 1 tablet (10 mg total) by mouth daily. 90 tablet 3  . atorvastatin (LIPITOR) 40 MG tablet TAKE 1 TABLET BY MOUTH EVERY DAY 90 tablet 0  . blood glucose meter kit and supplies Dispense based on patient and insurance preference. Use up to four times daily as directed. (FOR ICD-10 E10.9, E11.9). 1 each 3  . colchicine 0.6 MG tablet Take 0.6 mg by mouth  2 (two) times daily as needed (for gout).    Marland Kitchen doxazosin (CARDURA) 1 MG tablet Take 1 tablet (1 mg total) by mouth at bedtime. 30 tablet 6  . doxycycline (ADOXA) 100 MG tablet Take 1 tablet (100 mg total) by mouth 2 (two) times daily for 7 days. 14 tablet 0  . furosemide (LASIX) 40 MG tablet TAKE 1 TABLET BY MOUTH EVERY DAY 90 tablet 1  . gabapentin (NEURONTIN) 300 MG capsule     . glucose blood test strip Use as instructed 100 each 12  . hydrALAZINE (APRESOLINE) 100 MG tablet Take 1 tablet (100 mg total) by mouth 3 (three) times daily. 180 tablet 5  . insulin glargine (LANTUS) 100 unit/mL SOPN Inject 0.15 mLs (15 Units total) into the skin daily. (Patient taking differently: Inject 15 Units into the skin daily as needed (blood sugar over 150). )    . Insulin Pen Needle 29G X 5MM MISC Use as directed 200 each 0  . latanoprost (XALATAN) 0.005 % ophthalmic solution Place 1 drop into both eyes at bedtime.     Marland Kitchen levothyroxine (SYNTHROID) 88 MCG tablet Take 1 tablet (88 mcg total) by mouth daily. 30 tablet 2  . metoprolol succinate (TOPROL-XL) 25 MG 24 hr tablet Take 1 tablet (25 mg total) by mouth daily. STOP taking this unless heart rate >100bpm OR directed by your doctor 30 tablet 5  . Multiple Vitamins-Minerals (CENTRUM SILVER 50+WOMEN) TABS Take 1 tablet  by mouth daily.    . sildenafil (REVATIO) 20 MG tablet Take 1 tablet (20 mg total) by mouth 3 (three) times daily. 270 tablet 3  . sitaGLIPtin (JANUVIA) 50 MG tablet Take 1 tablet (50 mg total) by mouth daily. 90 tablet 0               Allergies:  Allergies  Allergen Reactions  . Ancef [Cefazolin] Itching    Severe itching- after procedure, ancef was the antibiotic.-04/02/17 Tolerates penicillins  . Lactose Intolerance (Gi) Diarrhea  . Sulfa Antibiotics Rash and Other (See Comments)    Blisters, also     Physical Exam:    Blood pressure (!) 158/33, pulse 63, temperature 98.1 F (36.7 C), temperature source Temporal, resp.  rate 18, height _0  (1.626 m), weight 199 lb 3.2 oz (90.4 kg), SpO2 100 %.      ECOG: 2   General appearance: Alert, awake without any distress. Head: Atraumatic without abnormalities Oropharynx: Without any thrush or ulcers. Eyes: No scleral icterus. Lymph nodes: No lymphadenopathy noted in the cervical, supraclavicular, or axillary nodes Heart:regular rate and rhythm, without any murmurs or gallops.   Lung: Clear to auscultation without any rhonchi, wheezes or dullness to percussion. Abdomin: Soft, nontender without any shifting dullness or ascites. Musculoskeletal: No clubbing or cyanosis. Neurological: No motor or sensory deficits. Skin: No rashes or lesions.                   Lab Results: Lab Results  Component Value Date   WBC 5.1 01/11/2020   HGB 8.3 (L) 01/11/2020   HCT 24.5 (L) 01/11/2020   MCV 92.8 01/11/2020   PLT 197 01/11/2020     Chemistry      Component Value Date/Time   NA 135 01/11/2020 1203   NA 135 06/22/2019 1216   NA 134 (L) 05/26/2017 0949   K 4.9 01/11/2020 1203   K 4.0 05/26/2017 0949   CL 104 01/11/2020 1203   CL 96 (L) 03/31/2012 1513   CO2 21 (L) 01/11/2020 1203   CO2 24 05/26/2017 0949   BUN 56 (H) 01/11/2020 1203   BUN 64 (H) 06/22/2019 1216   BUN 29.2 (H) 05/26/2017 0949   CREATININE 2.51 (H) 01/11/2020 1203   CREATININE 2.24 (H) 01/27/2019 1355   CREATININE 1.4 (H) 05/26/2017 0949      Component Value Date/Time   CALCIUM 8.9 01/11/2020 1203   CALCIUM 8.9 05/26/2017 0949   ALKPHOS 115 01/11/2020 1203   ALKPHOS 157 (H) 05/26/2017 0949   AST 21 01/11/2020 1203   AST 29 05/26/2017 0949   ALT 12 01/11/2020 1203   ALT 23 05/26/2017 0949   BILITOT 0.9 01/11/2020 1203   BILITOT 2.19 (H) 05/26/2017 0949      Impression and Plan:  64 year old woman with:   1.  MDS presented with anemia diagnosed in 2017.     She continues to tolerate Reblozyl with reasonable hemoglobin response.  She still requiring  transfusion occasionally but has decreased needs at this time.  I have recommended that continuing this treatment for the time being without any modifications.  She is really not a candidate for any aggressive measures including stem cell transplant.   2.  Anemia: Hemoglobin today down to 7.5 and she would benefit from packed red cell transfusion.  Risks and benefits of continued transfusion were reviewed today.  Iron overload complications were also discussed.  3. Hypertension: Blood pressure under control at this time without any  issues.   4.  Aortic stenosis: No recent issues with heart failure or hospitalizations.  Continues to follow with cardiology.   5.  Prognosis: Therapy remains palliative at this time her prognosis overall poor given multiple comorbid conditions.  Aggressive measures are warranted currently given her reasonable performance status.  6. Follow-up: Every 3 weeks for injection as well as MD follow-up in 9 weeks.  30  minutes were spent on this visit.  The time was dedicated to reviewing her disease status, discussing treatment options and future plan of care reviewed.Zola Button, MD 7/21/20219:23 AM

## 2020-02-02 NOTE — Telephone Encounter (Signed)
Called [employer] help desk they will start a "ticket" and call pt to see if they can help pt connect BP monitor

## 2020-02-02 NOTE — Telephone Encounter (Signed)
Called pt and she states that she called the help desk and they informed her that there is "something on our end" why she can not connect.

## 2020-02-03 ENCOUNTER — Ambulatory Visit (INDEPENDENT_AMBULATORY_CARE_PROVIDER_SITE_OTHER): Payer: Medicare Other | Admitting: Nurse Practitioner

## 2020-02-03 ENCOUNTER — Encounter: Payer: Self-pay | Admitting: Nurse Practitioner

## 2020-02-03 VITALS — BP 162/58 | HR 64 | Temp 98.2°F | Ht 67.0 in | Wt 196.6 lb

## 2020-02-03 DIAGNOSIS — E1122 Type 2 diabetes mellitus with diabetic chronic kidney disease: Secondary | ICD-10-CM | POA: Diagnosis not present

## 2020-02-03 DIAGNOSIS — Z23 Encounter for immunization: Secondary | ICD-10-CM

## 2020-02-03 DIAGNOSIS — E039 Hypothyroidism, unspecified: Secondary | ICD-10-CM | POA: Diagnosis not present

## 2020-02-03 DIAGNOSIS — N184 Chronic kidney disease, stage 4 (severe): Secondary | ICD-10-CM

## 2020-02-03 DIAGNOSIS — I10 Essential (primary) hypertension: Secondary | ICD-10-CM

## 2020-02-03 DIAGNOSIS — N185 Chronic kidney disease, stage 5: Secondary | ICD-10-CM | POA: Insufficient documentation

## 2020-02-03 DIAGNOSIS — Z8616 Personal history of COVID-19: Secondary | ICD-10-CM

## 2020-02-03 LAB — BPAM RBC
Blood Product Expiration Date: 202108042359
ISSUE DATE / TIME: 202107211108
Unit Type and Rh: 600

## 2020-02-03 LAB — TYPE AND SCREEN
ABO/RH(D): A NEG
Antibody Screen: NEGATIVE
Unit division: 0

## 2020-02-03 MED ORDER — PNEUMOCOCCAL 13-VAL CONJ VACC IM SUSP: 0.5000 mL | INTRAMUSCULAR | 0 refills | Status: AC

## 2020-02-03 MED ORDER — LINAGLIPTIN 5 MG PO TABS: 5.0000 mg | ORAL_TABLET | Freq: Every day | ORAL | 1 refills | Status: DC

## 2020-02-03 MED ORDER — BASAGLAR KWIKPEN 100 UNIT/ML ~~LOC~~ SOPN: 14.0000 [IU] | PEN_INJECTOR | Freq: Every day | SUBCUTANEOUS | 1 refills | Status: DC

## 2020-02-03 NOTE — Progress Notes (Addendum)
This visit occurred during the SARS-CoV-2 public health emergency.  Safety protocols were in place, including screening questions prior to the visit, additional usage of staff PPE, and extensive cleaning of exam room while observing appropriate contact time as indicated for disinfecting solutions.  Subjective:     Patient ID: Patricia Mata , female    DOB: 21-Feb-1956 , 64 y.o.   MRN: [id number]   Chief Complaint  Patient presents with  . Hypertension  . Diabetes    HPI  She is going to the Nephrologist who has advised her not to drink sodas and calcium.  She has cut back on her cheese intake as well. She does use creamer daily for her coffee.   Diabetes She presents for her follow-up diabetic visit. She has type 2 diabetes mellitus. Her disease course has been stable. There are no hypoglycemic associated symptoms. Pertinent negatives for hypoglycemia include no dizziness. There are no diabetic associated symptoms. Pertinent negatives for diabetes include no blurred vision, no chest pain and no fatigue. There are no hypoglycemic complications. Symptoms are worsening. There are no diabetic complications. Current diabetic treatment includes oral agent (dual therapy). She is following a generally healthy diet. When asked about meal planning, she reported none. She rarely participates in exercise. (118-216 blood sugars. She has not been taking the tradjenta)  Hypertension This is a chronic problem. The current episode started more than 1 year ago. The problem is unchanged. The problem is controlled. Pertinent negatives include no blurred vision, chest pain or palpitations. There are no associated agents to hypertension. Risk factors for coronary artery disease include obesity, sedentary lifestyle, diabetes mellitus and dyslipidemia. There are no compliance problems.  There is no history of angina. There is no history of chronic renal disease.     Past Medical History:  Diagnosis Date  .  Acute kidney injury (nontraumatic) ([place])   . Acute renal failure (ARF) ([place]) 06/08/2018  . Cancer [facility]) 2008   Colon   . Diabetes mellitus without complication ([place])   . Gout 09/08/2018  . Hypertension   . Macrocytic anemia      Family History  Problem Relation Age of Onset  . Hypertension Mother   . Diabetes Mother   . Cervical cancer Mother   . Heart attack Father   . Hypertension Sister   . Hypertension Brother   . Hypertension Sister   . Hypertension Sister   . Prostate cancer Brother   . HIV/AIDS Brother      Current Outpatient Medications:  .  albuterol (PROAIR HFA) 108 (90 Base) MCG/ACT inhaler, Inhale 2 puffs into the lungs every 6 (six) hours as needed for wheezing or shortness of breath., Disp: 18 g, Rfl: 2 .  allopurinol (ZYLOPRIM) 100 MG tablet, Take 100 mg by mouth daily., Disp: , Rfl:  .  amiodarone (PACERONE) 200 MG tablet, TAKE 1 TABLET BY MOUTH EVERY DAY, Disp: 90 tablet, Rfl: 1 .  amLODipine (NORVASC) 10 MG tablet, Take 1 tablet (10 mg total) by mouth daily., Disp: 90 tablet, Rfl: 3 .  atorvastatin (LIPITOR) 40 MG tablet, Take 1 tablet (40 mg total) by mouth daily., Disp: 90 tablet, Rfl: 1 .  blood glucose meter kit and supplies, Dispense based on patient and insurance preference. Use up to four times daily as directed. (FOR ICD-10 E10.9, E11.9)., Disp: 1 each, Rfl: 3 .  Blood Glucose Monitoring Suppl (ONETOUCH ULTRALINK) w/Device KIT, Check blood sugars twice daily E11.9, Disp: 1 kit, Rfl: 0 .  cloNIDine (  CATAPRES) 0.1 MG tablet, Take 1 tablet (0.1 mg total) by mouth 2 (two) times daily., Disp: 60 tablet, Rfl: 6 .  colchicine 0.6 MG tablet, TAKE 1 TABLET BY MOUTH TWICE A DAY AS NEEDED FOR GOUT, Disp: 180 tablet, Rfl: 1 .  dextromethorphan-guaiFENesin (MUCINEX DM) 30-600 MG 12hr tablet, Take 1 tablet by mouth 2 (two) times daily., Disp: 30 tablet, Rfl: 0 .  doxazosin (CARDURA) 1 MG tablet, Take 2 mg by mouth at bedtime., Disp: , Rfl:  .  fluticasone (FLONASE) 50  MCG/ACT nasal spray, Place 1 spray into both nostrils daily., Disp: 16 g, Rfl: 2 .  furosemide (LASIX) 40 MG tablet, TAKE 1 TABLET BY MOUTH EVERY DAY, Disp: 90 tablet, Rfl: 1 .  glucose blood test strip, Use as instructed, Disp: 100 each, Rfl: 2 .  hydrALAZINE (APRESOLINE) 50 MG tablet, Take 1 tablet (50 mg total) by mouth 3 (three) times daily., Disp: 270 tablet, Rfl: 3 .  Insulin Glargine (BASAGLAR KWIKPEN) 100 UNIT/ML, Inject 0.12 mLs (12 Units total) into the skin daily., Disp: 1 pen, Rfl: 1 .  Insulin Pen Needle 29G X 5MM MISC, Use as directed, Disp: 200 each, Rfl: 0 .  Lancets (ONETOUCH DELICA PLUS [id number]) MISC, Use as instructed to check blood sugars twice daily, Disp: 100 each, Rfl: 2 .  Lancets (ONETOUCH ULTRASOFT) lancets, Use as instructed to check blood sugars twice daily E11.9, Disp: 100 each, Rfl: 12 .  latanoprost (XALATAN) 0.005 % ophthalmic solution, Place 1 drop into both eyes at bedtime. , Disp: , Rfl:  .  levofloxacin (LEVAQUIN) 500 MG tablet, Take 1 tablet (500 mg total) by mouth daily., Disp: 7 tablet, Rfl: 0 .  levothyroxine (SYNTHROID) 88 MCG tablet, TAKE 1 TABLET BY MOUTH EVERY DAY, Disp: 90 tablet, Rfl: 1 .  linagliptin (TRADJENTA) 5 MG TABS tablet, Take 1 tablet (5 mg total) by mouth daily., Disp: 90 tablet, Rfl: 1 .  loratadine (CLARITIN) 10 MG tablet, Take 1 tablet (10 mg total) by mouth daily., Disp: 30 tablet, Rfl: 11 .  Multiple Vitamins-Minerals (CENTRUM SILVER 50+WOMEN) TABS, Take 1 tablet by mouth daily., Disp: , Rfl:  .  oxymetazoline (AFRIN) 0.05 % nasal spray, Place 1 spray into both nostrils 2 (two) times daily as needed for congestion., Disp: , Rfl:  .  sildenafil (REVATIO) 20 MG tablet, Take 1 tablet (20 mg total) by mouth 3 (three) times daily., Disp: 270 tablet, Rfl: 3   Allergies  Allergen Reactions  . Ancef [Cefazolin] Itching    Severe itching- after procedure, ancef was the antibiotic.-04/02/17 Tolerates penicillins  . Lactose Intolerance (Gi)  Diarrhea  . Sulfa Antibiotics Rash and Other (See Comments)    Blisters, also     Review of Systems  Constitutional: Negative.  Negative for fatigue.  Eyes: Negative for blurred vision.  Respiratory: Negative.   Cardiovascular: Negative for chest pain, palpitations and leg swelling.  Genitourinary:       Nocturia  Neurological: Negative for dizziness.  Psychiatric/Behavioral: Negative.      Today's Vitals   02/03/20 1152  BP: (!) 150/40  Pulse: 64  Temp: 98.2 F (36.8 C)  TempSrc: Oral  Weight: 196 lb 9.6 oz (89.2 kg)  Height: '5\' 7"'  (1.702 m)  PainSc: 0-No pain   Body mass index is 30.79 kg/m.   Objective:  Physical Exam Vitals reviewed.  Constitutional:      General: She is not in acute distress.    Appearance: Normal appearance. She is well-developed. She is  obese.  Cardiovascular:     Rate and Rhythm: Normal rate and regular rhythm.     Pulses: Normal pulses.     Heart sounds: Normal heart sounds. No murmur heard.   Pulmonary:     Effort: Pulmonary effort is normal. No respiratory distress.     Breath sounds: Normal breath sounds.  Chest:     Chest wall: No tenderness.  Musculoskeletal:        General: Normal range of motion.  Skin:    General: Skin is warm and dry.     Capillary Refill: Capillary refill takes less than 2 seconds.  Neurological:     General: No focal deficit present.     Mental Status: She is alert and oriented to person, place, and time.  Psychiatric:        Mood and Affect: Mood normal.        Behavior: Behavior normal.        Thought Content: Thought content normal.        Judgment: Judgment normal.         Assessment And Plan:   1. Essential hypertension  Chronic, elevated this visit with systolic  She is being followed by cardiology - POCT Urinalysis Dipstick (81002) - POCT UA - Microalbumin - CMP14+EGFR  2. Type 2 diabetes mellitus with stage 3 chronic kidney disease, with long-term current use of insulin, unspecified  whether stage 3a or 3b CKD ([place])  Blood sugars have been more elevated, she is to take the basaglar daily   Will check HgbA1c  She is being followed by Nephrology  Encouraged to stay well hydrated with water. - Hemoglobin A1c - POCT Urinalysis Dipstick (81002) - POCT UA - Microalbumin - Insulin Glargine (BASAGLAR KWIKPEN) 100 UNIT/ML; Inject 0.14 mLs (14 Units total) into the skin daily.  Dispense: 5 pen; Refill: 1 - CMP14+EGFR  3. History of COVID-19  She is doing much better  4. Encounter for immunization  Sent to pharmacy - pneumococcal 13-valent conjugate vaccine (PREVNAR 13) SUSP injection; Inject 0.5 mLs into the muscle tomorrow at 10 am for 1 dose.  Dispense: 0.5 mL; Refill: 0  5. Acquired hypothyroidism  Will check thyroid levels and make changes as necessary - TSH - T4 - T3, free      [name], FNP    THE PATIENT IS ENCOURAGED TO PRACTICE SOCIAL DISTANCING DUE TO THE COVID-19 PANDEMIC.

## 2020-02-04 LAB — CMP14+EGFR
ALT: 10 IU/L (ref 0–32)
AST: 21 IU/L (ref 0–40)
Albumin/Globulin Ratio: 1.3 (ref 1.2–2.2)
Albumin: 4 g/dL (ref 3.8–4.8)
Alkaline Phosphatase: 98 IU/L (ref 48–121)
BUN/Creatinine Ratio: 16 (ref 12–28)
BUN: 46 mg/dL — ABNORMAL HIGH (ref 8–27)
Bilirubin Total: 1.1 mg/dL (ref 0.0–1.2)
CO2: 19 mmol/L — ABNORMAL LOW (ref 20–29)
Calcium: 9.1 mg/dL (ref 8.7–10.3)
Chloride: 100 mmol/L (ref 96–106)
Creatinine, Ser: 2.9 mg/dL — ABNORMAL HIGH (ref 0.57–1.00)
GFR calc Af Amer: 19 mL/min/{1.73_m2} — ABNORMAL LOW (ref >59)
GFR calc non Af Amer: 16 mL/min/{1.73_m2} — ABNORMAL LOW (ref >59)
Globulin, Total: 3 g/dL (ref 1.5–4.5)
Glucose: 179 mg/dL — ABNORMAL HIGH (ref 65–99)
Potassium: 4.4 mmol/L (ref 3.5–5.2)
Sodium: 136 mmol/L (ref 134–144)
Total Protein: 7 g/dL (ref 6.0–8.5)

## 2020-02-04 LAB — HEMOGLOBIN A1C
Est. average glucose Bld gHb Est-mCnc: 131 mg/dL
Hgb A1c MFr Bld: 6.2 % — ABNORMAL HIGH (ref 4.8–5.6)

## 2020-02-04 LAB — T3, FREE: T3, Free: 1.2 pg/mL — ABNORMAL LOW (ref 2.0–4.4)

## 2020-02-04 LAB — TSH: TSH: 5.46 u[IU]/mL — ABNORMAL HIGH (ref 0.450–4.500)

## 2020-02-04 LAB — T4: T4, Total: 8.5 ug/dL (ref 4.5–12.0)

## 2020-02-04 NOTE — Telephone Encounter (Signed)
[employer] called pt 2 time and has been unable to get to pt or she has been unable to have her machine to get connected. [employer] will call one more time.

## 2020-02-07 ENCOUNTER — Ambulatory Visit (HOSPITAL_COMMUNITY): Payer: BC Managed Care – PPO

## 2020-02-08 ENCOUNTER — Ambulatory Visit (HOSPITAL_COMMUNITY)
Admission: RE | Admit: 2020-02-08 | Discharge: 2020-02-08 | Disposition: A | Discharge status: Home / Self Care | Payer: Medicare Other | Source: Ambulatory Visit | Attending: Cardiology | Admitting: Cardiology

## 2020-02-08 ENCOUNTER — Other Ambulatory Visit: Payer: Self-pay

## 2020-02-08 ENCOUNTER — Telehealth: Payer: Self-pay | Admitting: Emergency Medicine

## 2020-02-08 DIAGNOSIS — I10 Essential (primary) hypertension: Secondary | ICD-10-CM | POA: Diagnosis not present

## 2020-02-08 NOTE — Telephone Encounter (Signed)
Thank you :)

## 2020-02-08 NOTE — Telephone Encounter (Signed)
[facility]   Patricia Mata M 1 hour ago (10:38 AM)  [place] Calling to inform us that pt was a no show.   Incoming call    Dr.Byrum FYI

## 2020-02-11 ENCOUNTER — Telehealth: Payer: Self-pay

## 2020-02-11 NOTE — Telephone Encounter (Signed)
Called patient to discuss inactivity of BP checks in [place]. Patient stated they have been checking their BP but they are not able to connect device to app. A ticket/encounter was placed on patient's behalf with [employer]. Will call patient back on 02/14/20.

## 2020-02-14 ENCOUNTER — Telehealth: Payer: Self-pay | Admitting: Oncology

## 2020-02-14 NOTE — Progress Notes (Signed)
[facility] PREP Weekly Session   Patient Details  Name: Patricia Mata MRN: [id number] Date of Birth: [birth year] Age: 64 y.o. PCP: [name], FNP  Vitals:   02/14/20 1447  Weight: 206 lb (93.4 kg)     [name] Weekly seesion - 02/14/20 1400      Weekly Session   Topic Discussed Importance of resistance training;Other ways to be active    Minutes exercised this week 150 minutes    Classes attended to date 3          Fun things since last meeting: enjoying family Grateful for: to be alive   Barnett Hatter 02/14/2020, 2:48 PM

## 2020-02-14 NOTE — Telephone Encounter (Signed)
Scheduled per 07/21 los, patient has been called and voicemail was left.

## 2020-02-15 ENCOUNTER — Ambulatory Visit: Payer: BC Managed Care – PPO

## 2020-02-15 ENCOUNTER — Telehealth: Payer: Self-pay

## 2020-02-15 DIAGNOSIS — Z0000 Encounter for general adult medical examination without abnormal findings: Secondary | ICD-10-CM

## 2020-02-15 NOTE — Progress Notes (Deleted)
02/15/2020 Patricia Mata [birth year] [id number]   HPI:  Patricia Mata is a 64 y.o. female patient of Dr Oval Linsey, with a [place] below who presents today for advanced hypertension clinic follow up.  She noted that she has been hypertensive since she was in her 68's and was well controlled for some time.  Most recently she notes home systolic BP readings 371-062'I, and often > 180 prior to taking her daily medications.  She was set up with [employer], however notes that she has problems with the app and has not been able to download readings.  A ticket was placed with [employer] on July 30.  She has now completed 3 visits with the PREP program at the [facility].   Past Medical History: CAD   hyperlipidemia   Pulmonary hypertension On sidenafil 20 mg tid  Atrial fibrillation   CHF LVEF 94-85%, grade 2 diastolic dysfunction  DM2   CKD Stage 4 - SCr 2.90, GFR 19  myelodysplasia Transfusion dependent    Secondary Causes of Hypertension:  Medications/Herbal: OCP, steroids, stimulants, antidepressants, weight loss medication, immune suppressants, NSAIDs, sympathomimetics, alcohol, caffeine, licorice, ginseng, St. John's wort, chemo (none apply)  Sleep Apnea (no symptoms)  Renal artery stenosis (right > 60% stenosis, left 1-59%) Hyperaldosteronism (testing not indicated) Hyper/hypothyroidism (mildly hypothyroid 11/2019- meds adjusted) Pheochromocytoma: (testing not indicated) Cushing's syndrome:(testing not indicated) Coarctation of the aorta   Blood Pressure Goal:  130/80  Current Medications: hydralazine 50 mg tid, clonidine 0.1 mg bid, amlodipine 10 mg qd, doxazosin 2 mg qhs, furosemide  Family Hx:  Social Hx:  Diet:  Exercise:  Home BP readings:  Intolerances:   Labs: 7/21:  Na 1.3, K 4.4, Glu 179, BUN 46, SCr 2.90, GFR (AA) 19  Wt Readings from Last 3 Encounters:  02/14/20 206 lb (93.4 kg)  02/03/20 196 lb 9.6 oz (89.2 kg)  02/02/20 199 lb 3.2 oz (90.4 kg)    BP Readings from Last 3 Encounters:  02/03/20 (!) 162/58  02/02/20 (!) 167/47  02/02/20 (!) 158/33   Pulse Readings from Last 3 Encounters:  02/03/20 64  02/02/20 (!) 50  02/02/20 63    Current Outpatient Medications  Medication Sig Dispense Refill  . albuterol (PROAIR HFA) 108 (90 Base) MCG/ACT inhaler Inhale 2 puffs into the lungs every 6 (six) hours as needed for wheezing or shortness of breath. 18 g 2  . allopurinol (ZYLOPRIM) 100 MG tablet Take 100 mg by mouth daily.    Marland Kitchen amiodarone (PACERONE) 200 MG tablet TAKE 1 TABLET BY MOUTH EVERY DAY 90 tablet 1  . amLODipine (NORVASC) 10 MG tablet Take 1 tablet (10 mg total) by mouth daily. 90 tablet 3  . atorvastatin (LIPITOR) 40 MG tablet Take 1 tablet (40 mg total) by mouth daily. 90 tablet 1  . blood glucose meter kit and supplies Dispense based on patient and insurance preference. Use up to four times daily as directed. (FOR ICD-10 E10.9, E11.9). 1 each 3  . Blood Glucose Monitoring Suppl (ONETOUCH ULTRALINK) w/Device KIT Check blood sugars twice daily E11.9 1 kit 0  . cloNIDine (CATAPRES) 0.1 MG tablet Take 1 tablet (0.1 mg total) by mouth 2 (two) times daily. 60 tablet 6  . colchicine 0.6 MG tablet TAKE 1 TABLET BY MOUTH TWICE A DAY AS NEEDED FOR GOUT 180 tablet 1  . dextromethorphan-guaiFENesin (MUCINEX DM) 30-600 MG 12hr tablet Take 1 tablet by mouth 2 (two) times daily. 30 tablet 0  . doxazosin (CARDURA) 1  MG tablet Take 2 mg by mouth at bedtime.    . fluticasone (FLONASE) 50 MCG/ACT nasal spray Place 1 spray into both nostrils daily. 16 g 2  . furosemide (LASIX) 40 MG tablet TAKE 1 TABLET BY MOUTH EVERY DAY 90 tablet 1  . glucose blood test strip Use as instructed 100 each 2  . hydrALAZINE (APRESOLINE) 50 MG tablet Take 1 tablet (50 mg total) by mouth 3 (three) times daily. 270 tablet 3  . Insulin Glargine (BASAGLAR KWIKPEN) 100 UNIT/ML Inject 0.14 mLs (14 Units total) into the skin daily. 5 pen 1  . Insulin Pen Needle 29G X 5MM  MISC Use as directed 200 each 0  . Lancets (ONETOUCH DELICA PLUS [id number]) MISC Use as instructed to check blood sugars twice daily 100 each 2  . Lancets (ONETOUCH ULTRASOFT) lancets Use as instructed to check blood sugars twice daily E11.9 100 each 12  . latanoprost (XALATAN) 0.005 % ophthalmic solution Place 1 drop into both eyes at bedtime.     Marland Kitchen levofloxacin (LEVAQUIN) 500 MG tablet Take 1 tablet (500 mg total) by mouth daily. 7 tablet 0  . levothyroxine (SYNTHROID) 88 MCG tablet TAKE 1 TABLET BY MOUTH EVERY DAY 90 tablet 1  . linagliptin (TRADJENTA) 5 MG TABS tablet Take 1 tablet (5 mg total) by mouth daily. 90 tablet 1  . loratadine (CLARITIN) 10 MG tablet Take 1 tablet (10 mg total) by mouth daily. 30 tablet 11  . Multiple Vitamins-Minerals (CENTRUM SILVER 50+WOMEN) TABS Take 1 tablet by mouth daily.    Marland Kitchen oxymetazoline (AFRIN) 0.05 % nasal spray Place 1 spray into both nostrils 2 (two) times daily as needed for congestion.    . sildenafil (REVATIO) 20 MG tablet Take 1 tablet (20 mg total) by mouth 3 (three) times daily. 270 tablet 3   No current facility-administered medications for this visit.    Allergies  Allergen Reactions  . Ancef [Cefazolin] Itching    Severe itching- after procedure, ancef was the antibiotic.-04/02/17 Tolerates penicillins  . Lactose Intolerance (Gi) Diarrhea  . Sulfa Antibiotics Rash and Other (See Comments)    Blisters, also    Past Medical History:  Diagnosis Date  . Acute kidney injury (nontraumatic) ([place])   . Acute renal failure (ARF) ([place]) 06/08/2018  . Cancer [facility]) 2008   Colon   . Diabetes mellitus without complication ([place])   . Gout 09/08/2018  . Hypertension   . Macrocytic anemia     There were no vitals taken for this visit.  No problem-specific Assessment & Plan notes found for this encounter.   Tommy Medal PharmD CPP [facility] [address] [place], [place] [address] [phone]

## 2020-02-15 NOTE — Telephone Encounter (Signed)
Called patient and left message for patient to bring her device in the day of her appt on 02/21/20 for troubleshooting.

## 2020-02-20 ENCOUNTER — Other Ambulatory Visit (HOSPITAL_COMMUNITY): Payer: Self-pay | Admitting: Internal Medicine

## 2020-02-20 DIAGNOSIS — E039 Hypothyroidism, unspecified: Secondary | ICD-10-CM

## 2020-02-21 NOTE — Progress Notes (Signed)
[facility] PREP Weekly Session   Patient Details  Name: Patricia Mata MRN: [id number] Date of Birth: 07/11/1956 Age: 64 y.o. PCP: [name], FNP  Vitals:   02/21/20 1458  Weight: 205 lb (93 kg)     [name] Weekly seesion - 02/21/20 1400      Weekly Session   Topic Discussed Healthy eating tips    Minutes exercised this week 87 minutes    Classes attended to date 5          Fun things since last meeting: clean, walk Grateful for: walking a little longer Nutrition celebration: tried to eat less Barriers/struggles: walking long distances  Pam Tally Joe 02/21/2020, 2:58 PM

## 2020-02-21 NOTE — Progress Notes (Signed)
02/22/2020 Patricia Mata [birth year] [id number]   HPI:  Patricia Mata is a 64 y.o. female patient of Dr Oval Linsey, with a Ferdinand below who presents today for advanced hypertension clinic follow up.  She was seen by Dr. Oval Linsey about 6 weeks ago and found to have a blood pressure of 166/68, despite multiple medications.  Chart indicates that she was first diagnosed with hypertension in her 5's, and has not been well controlled.  Recent renal artery dopplers showed a >60% stenosis of the right renal artery.  She has an appointment with Dr. Gwenlyn Found next week to review this information.    Today she reports feeling well overall.  She has been frustrated with the Viv[employer] app, as it has not worked from the beginning. For now I have asked that she just use the cuff and record the readings on a log sheet.  No concerns about her medications, and states compliance with everything.    Past Medical History: PAF CHADS2-VASc score of 5 - no anticoagulation or rate/rhythm control  DM2 A1c 6.2 - On basaglar, tradjenta   Myelodysplastic sydrome W/ transfusion dependent anemia  Pulmonary hypertension On sildenafil 20 mg tid     Blood Pressure Goal:  130/80  Current Medications: amlodipine 10 mg qd am, clonidine 0.1 mg bid (am, 5pm), furosemide 40 mg qam, hydralazine 50 mg tid, doxazosin 2 mg hs  Family Hx: mother died at 33 from massive MI, was healthy until then; father probable MI about 47 years ago, was probably in his late 26's; oldest sister with hypertension, another with hypertension; (4 sis, 3 bro); 1 son Armed forces logistics/support/administrative officer) with some valve issues (pt unsure) since childhood  Social Hx: no tobacco, occasional wine cooler; drinks decaf coffee  Diet: mostly home cooked; rarely fries food; plenty of vegetables, fewer fruits;    Exercise: loves exercise class with Pam; using exercise bike at home occaisonally  Home BP readings: pt used W&A cuff and just recorded on paper.  16 readings, average  149/60 with range of 121-164/46-74.  HR average 59.    Intolerances: sulfa antibiotics, cefazolin, dairy  Labs: 7/22: Na 136, K 4.4, Glu 179, BUN 46, SCr 2.9, GFR 19  Medications/Herbal: OCP, steroids, stimulants, antidepressants, weight loss medication, immune suppressants, NSAIDs, sympathomimetics, alcohol, caffeine, licorice, ginseng, St. John's wort, chemo (none apply)  Sleep Apnea (no symptoms)  Renal artery stenosis (ordering renal artery Dopplers) Hyperaldosteronism (testing not indicated) Hyper/hypothyroidism (mildly hypothyroid 11/2019- meds adjusted) Pheochromocytoma: (testing not indicated) Cushing's syndrome:(testing not indicated) Coarctation of the aorta  Wt Readings from Last 3 Encounters:  02/22/20 205 lb 6.4 oz (93.2 kg)  02/21/20 205 lb (93 kg)  02/14/20 206 lb (93.4 kg)   BP Readings from Last 3 Encounters:  02/22/20 (!) 148/52  02/03/20 (!) 162/58  02/02/20 (!) 167/47   Pulse Readings from Last 3 Encounters:  02/22/20 69  02/03/20 64  02/02/20 (!) 50    Current Outpatient Medications  Medication Sig Dispense Refill  . albuterol (PROAIR HFA) 108 (90 Base) MCG/ACT inhaler Inhale 2 puffs into the lungs every 6 (six) hours as needed for wheezing or shortness of breath. 18 g 2  . allopurinol (ZYLOPRIM) 100 MG tablet Take 100 mg by mouth daily.    Marland Kitchen amiodarone (PACERONE) 200 MG tablet TAKE 1 TABLET BY MOUTH EVERY DAY 90 tablet 1  . amLODipine (NORVASC) 10 MG tablet TAKE 1 TABLET BY MOUTH EVERY DAY 90 tablet 3  . atorvastatin (LIPITOR) 40 MG tablet Take  1 tablet (40 mg total) by mouth daily. 90 tablet 1  . blood glucose meter kit and supplies Dispense based on patient and insurance preference. Use up to four times daily as directed. (FOR ICD-10 E10.9, E11.9). 1 each 3  . Blood Glucose Monitoring Suppl (ONETOUCH ULTRALINK) w/Device KIT Check blood sugars twice daily E11.9 1 kit 0  . cloNIDine (CATAPRES) 0.1 MG tablet Take 1 tablet (0.1 mg total) by mouth 2 (two)  times daily. 60 tablet 6  . colchicine 0.6 MG tablet TAKE 1 TABLET BY MOUTH TWICE A DAY AS NEEDED FOR GOUT 180 tablet 1  . dextromethorphan-guaiFENesin (MUCINEX DM) 30-600 MG 12hr tablet Take 1 tablet by mouth 2 (two) times daily. 30 tablet 0  . doxazosin (CARDURA) 1 MG tablet Take 2 mg by mouth at bedtime.    . fluticasone (FLONASE) 50 MCG/ACT nasal spray Place 1 spray into both nostrils daily. 16 g 2  . furosemide (LASIX) 40 MG tablet TAKE 1 TABLET BY MOUTH EVERY DAY 90 tablet 1  . glucose blood test strip Use as instructed 100 each 2  . Insulin Glargine (BASAGLAR KWIKPEN) 100 UNIT/ML Inject 0.14 mLs (14 Units total) into the skin daily. 5 pen 1  . Insulin Pen Needle 29G X 5MM MISC Use as directed 200 each 0  . Lancets (ONETOUCH DELICA PLUS [id number]) MISC Use as instructed to check blood sugars twice daily 100 each 2  . Lancets (ONETOUCH ULTRASOFT) lancets Use as instructed to check blood sugars twice daily E11.9 100 each 12  . latanoprost (XALATAN) 0.005 % ophthalmic solution Place 1 drop into both eyes at bedtime.     Marland Kitchen levothyroxine (SYNTHROID) 88 MCG tablet TAKE 1 TABLET BY MOUTH EVERY DAY 90 tablet 1  . linagliptin (TRADJENTA) 5 MG TABS tablet Take 1 tablet (5 mg total) by mouth daily. 90 tablet 1  . loratadine (CLARITIN) 10 MG tablet Take 1 tablet (10 mg total) by mouth daily. 30 tablet 11  . Multiple Vitamins-Minerals (CENTRUM SILVER 50+WOMEN) TABS Take 1 tablet by mouth daily.    Marland Kitchen oxymetazoline (AFRIN) 0.05 % nasal spray Place 1 spray into both nostrils 2 (two) times daily as needed for congestion.    . sildenafil (REVATIO) 20 MG tablet Take 1 tablet (20 mg total) by mouth 3 (three) times daily. 270 tablet 3  . hydrALAZINE (APRESOLINE) 100 MG tablet Take 1 tablet (100 mg total) by mouth 3 (three) times daily. 90 tablet 3   No current facility-administered medications for this visit.    Allergies  Allergen Reactions  . Ancef [Cefazolin] Itching    Severe itching- after procedure,  ancef was the antibiotic.-04/02/17 Tolerates penicillins  . Lactose Intolerance (Gi) Diarrhea  . Sulfa Antibiotics Rash and Other (See Comments)    Blisters, also    Past Medical History:  Diagnosis Date  . Acute kidney injury (nontraumatic) ([place])   . Acute renal failure (ARF) ([place]) 06/08/2018  . Cancer [facility]) 2008   Colon   . Diabetes mellitus without complication ([place])   . Gout 09/08/2018  . Hypertension   . Macrocytic anemia     Blood pressure (!) 148/52, pulse 69, resp. rate 16, height 5' 4" (1.626 m), weight 205 lb 6.4 oz (93.2 kg), SpO2 98 %.  Essential hypertension Patient has had ongoing problems with the Vivify app since starting.  States that when she called to help desk, was told her cuff was not registered to our office.  She has had 2 separate cuffs, and  neither would connect.  This has caused her a lot of frustration.  Advised that for now, she will continue to use the cuff, but just keep a log of home BP readings and bring that to her next appointment.    Will have patient increase hydralazine to 100 mg three times daily and continue with other medications.  She will need to clarify if she is taking 1 or 2 mg of doxazosin, as her notes don't match our chart dose.   We will see her back in the office in one month for her second of three ADV HTN PharmD visits.  Tommy Medal PharmD CPP [facility] [address] [place], [place] [address] [phone]

## 2020-02-22 ENCOUNTER — Other Ambulatory Visit: Payer: Self-pay

## 2020-02-22 ENCOUNTER — Ambulatory Visit (INDEPENDENT_AMBULATORY_CARE_PROVIDER_SITE_OTHER): Payer: BC Managed Care – PPO | Admitting: Pharmacist Clinician (PhC)/ Clinical Pharmacy Specialist

## 2020-02-22 DIAGNOSIS — D469 Myelodysplastic syndrome, unspecified: Secondary | ICD-10-CM

## 2020-02-22 DIAGNOSIS — I10 Essential (primary) hypertension: Secondary | ICD-10-CM | POA: Diagnosis not present

## 2020-02-22 MED ORDER — HYDRALAZINE HCL 100 MG PO TABS
100.0000 mg | ORAL_TABLET | Freq: Three times a day (TID) | ORAL | 3 refills | Status: DC
Start: 2020-02-22 — End: 2020-07-27

## 2020-02-22 NOTE — Patient Instructions (Signed)
Return for a a follow up appointment   Check your blood pressure at home twice daily (WITHOUT APP)  and keep record of the readings.  Take your BP meds as follows:  Increase hydralazine to 100 mg three times daily (take 2 of the 50 mg tabs until gone)  Continue with all other medications  Bring all of your meds, your BP cuff and your record of home blood pressures to your next appointment.  Exercise as you're able, try to walk approximately 30 minutes per day.  Keep salt intake to a minimum, especially watch canned and prepared boxed foods.  Eat more fresh fruits and vegetables and fewer canned items.  Avoid eating in fast food restaurants.    HOW TO TAKE YOUR BLOOD PRESSURE: . Rest 5 minutes before taking your blood pressure. .  Don't smoke or drink caffeinated beverages for at least 30 minutes before. . Take your blood pressure before (not after) you eat. . Sit comfortably with your back supported and both feet on the floor (don't cross your legs). . Elevate your arm to heart level on a table or a desk. . Use the proper sized cuff. It should fit smoothly and snugly around your bare upper arm. There should be enough room to slip a fingertip under the cuff. The bottom edge of the cuff should be 1 inch above the crease of the elbow. . Ideally, take 3 measurements at one sitting and record the average.

## 2020-02-22 NOTE — Assessment & Plan Note (Addendum)
Patient has had ongoing problems with the Vivify app since starting.  States that when she called to help desk, was told her cuff was not registered to our office.  She has had 2 separate cuffs, and neither would connect.  This has caused her a lot of frustration.  Advised that for now, she will continue to use the cuff, but just keep a log of home BP readings and bring that to her next appointment.    Will have patient increase hydralazine to 100 mg three times daily and continue with other medications.  She will need to clarify if she is taking 1 or 2 mg of doxazosin, as her notes don't match our chart dose.   We will see her back in the office in one month for her second of three ADV HTN PharmD visits.

## 2020-02-23 ENCOUNTER — Inpatient Hospital Stay: Payer: Medicare Other | Attending: Oncology

## 2020-02-23 ENCOUNTER — Other Ambulatory Visit: Payer: Self-pay

## 2020-02-23 ENCOUNTER — Other Ambulatory Visit: Payer: Self-pay | Admitting: Nurse Practitioner

## 2020-02-23 ENCOUNTER — Inpatient Hospital Stay: Payer: Medicare Other

## 2020-02-23 VITALS — BP 164/39 | HR 60 | Temp 98.2°F | Resp 18

## 2020-02-23 DIAGNOSIS — N184 Chronic kidney disease, stage 4 (severe): Secondary | ICD-10-CM

## 2020-02-23 DIAGNOSIS — D631 Anemia in chronic kidney disease: Secondary | ICD-10-CM

## 2020-02-23 DIAGNOSIS — D469 Myelodysplastic syndrome, unspecified: Secondary | ICD-10-CM | POA: Diagnosis not present

## 2020-02-23 LAB — CBC WITH DIFFERENTIAL (CANCER CENTER ONLY)
Abs Immature Granulocytes: 0.06 10*3/uL (ref 0.00–0.07)
Basophils Absolute: 0 10*3/uL (ref 0.0–0.1)
Basophils Relative: 0 %
Eosinophils Absolute: 0.1 10*3/uL (ref 0.0–0.5)
Eosinophils Relative: 2 %
HCT: 24.8 % — ABNORMAL LOW (ref 36.0–46.0)
Hemoglobin: 8.3 g/dL — ABNORMAL LOW (ref 12.0–15.0)
Immature Granulocytes: 1 %
Lymphocytes Relative: 11 %
Lymphs Abs: 0.6 10*3/uL — ABNORMAL LOW (ref 0.7–4.0)
MCH: 31.4 pg (ref 26.0–34.0)
MCHC: 33.5 g/dL (ref 30.0–36.0)
MCV: 93.9 fL (ref 80.0–100.0)
Monocytes Absolute: 0.8 10*3/uL (ref 0.1–1.0)
Monocytes Relative: 15 %
Neutro Abs: 3.7 10*3/uL (ref 1.7–7.7)
Neutrophils Relative %: 71 %
Platelet Count: 189 10*3/uL (ref 150–400)
RBC: 2.64 MIL/uL — ABNORMAL LOW (ref 3.87–5.11)
RDW: 23.5 % — ABNORMAL HIGH (ref 11.5–15.5)
WBC Count: 5.2 10*3/uL (ref 4.0–10.5)
nRBC: 1.4 % — ABNORMAL HIGH (ref 0.0–0.2)

## 2020-02-23 LAB — CMP (CANCER CENTER ONLY)
ALT: 13 U/L (ref 0–44)
AST: 24 U/L (ref 15–41)
Albumin: 3.6 g/dL (ref 3.5–5.0)
Alkaline Phosphatase: 106 U/L (ref 38–126)
Anion gap: 10 (ref 5–15)
BUN: 57 mg/dL — ABNORMAL HIGH (ref 8–23)
CO2: 20 mmol/L — ABNORMAL LOW (ref 22–32)
Calcium: 9.3 mg/dL (ref 8.9–10.3)
Chloride: 107 mmol/L (ref 98–111)
Creatinine: 2.51 mg/dL — ABNORMAL HIGH (ref 0.44–1.00)
GFR, Est AFR Am: 23 mL/min — ABNORMAL LOW (ref >60)
GFR, Estimated: 19 mL/min — ABNORMAL LOW (ref >60)
Glucose, Bld: 188 mg/dL — ABNORMAL HIGH (ref 70–99)
Potassium: 4.5 mmol/L (ref 3.5–5.1)
Sodium: 137 mmol/L (ref 135–145)
Total Bilirubin: 0.9 mg/dL (ref 0.3–1.2)
Total Protein: 7.3 g/dL (ref 6.5–8.1)

## 2020-02-23 LAB — SAMPLE TO BLOOD BANK

## 2020-02-23 LAB — PREPARE RBC (CROSSMATCH)

## 2020-02-23 MED ORDER — FUROSEMIDE 10 MG/ML IJ SOLN
INTRAMUSCULAR | Status: AC
  Filled 2020-02-23: qty 2

## 2020-02-23 MED ORDER — DIPHENHYDRAMINE HCL 25 MG PO CAPS
ORAL_CAPSULE | ORAL | Status: AC
  Filled 2020-02-23: qty 1

## 2020-02-23 MED ORDER — ACETAMINOPHEN 325 MG PO TABS
650.0000 mg | ORAL_TABLET | Freq: Once | ORAL | Status: AC
  Administered 2020-02-23: 650 mg via ORAL

## 2020-02-23 MED ORDER — ACETAMINOPHEN 325 MG PO TABS
ORAL_TABLET | ORAL | Status: AC
  Filled 2020-02-23: qty 2

## 2020-02-23 MED ORDER — DIPHENHYDRAMINE HCL 25 MG PO CAPS
25.0000 mg | ORAL_CAPSULE | Freq: Once | ORAL | Status: AC
  Administered 2020-02-23: 25 mg via ORAL

## 2020-02-23 MED ORDER — LUSPATERCEPT-AAMT 75 MG ~~LOC~~ SOLR
1.3300 mg/kg | Freq: Once | SUBCUTANEOUS | Status: AC
  Administered 2020-02-23: 115 mg via SUBCUTANEOUS
  Filled 2020-02-23: qty 1

## 2020-02-23 MED ORDER — FUROSEMIDE 10 MG/ML IJ SOLN
20.0000 mg | Freq: Once | INTRAMUSCULAR | Status: AC
  Administered 2020-02-23: 20 mg via INTRAVENOUS

## 2020-02-23 MED ORDER — SODIUM CHLORIDE 0.9% IV SOLUTION
250.0000 mL | Freq: Once | INTRAVENOUS | Status: DC
  Filled 2020-02-23: qty 250

## 2020-02-23 NOTE — Patient Instructions (Signed)

## 2020-02-24 LAB — BPAM RBC
Blood Product Expiration Date: 202108222359
ISSUE DATE / TIME: 202108111302
Unit Type and Rh: 600

## 2020-02-24 LAB — TYPE AND SCREEN
ABO/RH(D): A NEG
Antibody Screen: NEGATIVE
Unit division: 0

## 2020-02-28 ENCOUNTER — Other Ambulatory Visit: Payer: Self-pay | Admitting: Nurse Practitioner

## 2020-02-28 DIAGNOSIS — E039 Hypothyroidism, unspecified: Secondary | ICD-10-CM

## 2020-02-28 MED ORDER — LEVOTHYROXINE SODIUM 112 MCG PO TABS: 112.0000 ug | ORAL_TABLET | Freq: Every day | ORAL | 11 refills | Status: DC

## 2020-02-28 NOTE — Progress Notes (Signed)
[employer] [facility] PREP Weekly Session   Patient Details  Name: Patricia Mata MRN: [id number] Date of Birth: 08-10-1955 Age: 64 y.o. PCP: [name], FNP  Vitals:   02/28/20 1452  Weight: 206 lb (93.4 kg)     [name] Weekly seesion - 02/28/20 1400      Weekly Session   Topic Discussed Health habits    Minutes exercised this week 16 minutes    Classes attended to date 6          Grateful for: walking Barriers/struggles: to walk longer distances   [place] 02/28/2020, 2:53 PM

## 2020-02-28 NOTE — Progress Notes (Signed)
I am increasing her medication to 112 mcg daily she needs to take only one once a day.

## 2020-02-29 ENCOUNTER — Ambulatory Visit: Payer: BC Managed Care – PPO | Admitting: Cardiovascular Disease

## 2020-03-02 ENCOUNTER — Ambulatory Visit (HOSPITAL_COMMUNITY)
Admission: RE | Admit: 2020-03-02 | Discharge: 2020-03-02 | Disposition: A | Discharge status: Home / Self Care | Payer: Medicare Other | Source: Ambulatory Visit | Attending: Emergency Medicine | Admitting: Emergency Medicine

## 2020-03-02 ENCOUNTER — Other Ambulatory Visit: Payer: Self-pay | Admitting: Nurse Practitioner

## 2020-03-02 ENCOUNTER — Other Ambulatory Visit: Payer: Self-pay | Admitting: Emergency Medicine

## 2020-03-02 ENCOUNTER — Other Ambulatory Visit: Payer: Self-pay

## 2020-03-02 DIAGNOSIS — E1122 Type 2 diabetes mellitus with diabetic chronic kidney disease: Secondary | ICD-10-CM

## 2020-03-02 DIAGNOSIS — N183 Chronic kidney disease, stage 3 unspecified: Secondary | ICD-10-CM

## 2020-03-02 DIAGNOSIS — J849 Interstitial pulmonary disease, unspecified: Secondary | ICD-10-CM | POA: Diagnosis present

## 2020-03-02 DIAGNOSIS — Z794 Long term (current) use of insulin: Secondary | ICD-10-CM

## 2020-03-02 MED ORDER — ONETOUCH DELICA PLUS LANCET30G MISC: 3 refills | Status: DC

## 2020-03-02 MED ORDER — GLUCOSE BLOOD VI STRP: ORAL_STRIP | 3 refills | Status: DC

## 2020-03-06 NOTE — Progress Notes (Signed)
[facility] PREP Weekly Session   Patient Details  Name: Patricia Mata MRN: [id number] Date of Birth: Jul 11, 1956 Age: 64 y.o. PCP: [name], FNP  Vitals:   03/06/20 1445  Weight: 206 lb (93.4 kg)     [name] Weekly seesion - 03/06/20 1400      Weekly Session   Topic Discussed Restaurant Eating    Minutes exercised this week 20 minutes    Classes attended to date 8           Fun things since last meeting: walk-danced Grateful for: walking Barriers/struggles: to continue to strength legs   Barnett Hatter 03/06/2020, 2:45 PM

## 2020-03-09 ENCOUNTER — Telehealth: Payer: Self-pay | Admitting: *Deleted

## 2020-03-09 ENCOUNTER — Other Ambulatory Visit: Payer: Self-pay

## 2020-03-09 ENCOUNTER — Ambulatory Visit (INDEPENDENT_AMBULATORY_CARE_PROVIDER_SITE_OTHER): Payer: Medicare Other | Admitting: Nurse Practitioner

## 2020-03-09 VITALS — BP 140/64 | HR 68 | Temp 97.8°F | Ht 64.0 in | Wt 206.0 lb

## 2020-03-09 DIAGNOSIS — I10 Essential (primary) hypertension: Secondary | ICD-10-CM

## 2020-03-09 DIAGNOSIS — E038 Other specified hypothyroidism: Secondary | ICD-10-CM

## 2020-03-09 DIAGNOSIS — Z794 Long term (current) use of insulin: Secondary | ICD-10-CM

## 2020-03-09 DIAGNOSIS — N183 Chronic kidney disease, stage 3 unspecified: Secondary | ICD-10-CM | POA: Diagnosis not present

## 2020-03-09 DIAGNOSIS — E1122 Type 2 diabetes mellitus with diabetic chronic kidney disease: Secondary | ICD-10-CM | POA: Diagnosis not present

## 2020-03-09 NOTE — Chronic Care Management (AMB) (Signed)
  Chronic Care Management   Note  03/09/2020 Name: Patricia Mata MRN: [id number] DOB: 08-22-1955  Patricia Mata is a 64 y.o. year old female who is a primary care patient of [name], [place]. I reached out to [name] by phone today in response to a referral sent by Patricia Mata's health plan.     Patricia Mata was given information about Chronic Care Management services today including:  1. CCM service includes personalized support from designated clinical staff supervised by her physician, including individualized plan of care and coordination with other care providers 2. 24/7 contact phone numbers for assistance for urgent and routine care needs. 3. Service will only be billed when office clinical staff spend 20 minutes or more in a month to coordinate care. 4. Only one practitioner may furnish and bill the service in a calendar month. 5. The patient may stop CCM services at any time (effective at the end of the month) by phone call to the office staff. 6. The patient will be responsible for cost sharing (co-pay) of up to 20% of the service fee (after annual deductible is met).  Patient agreed to services and verbal consent obtained.   Follow up plan: Face to Face appointment with care management team member scheduled for:  03/16/2020  [place], [place], [place] [address] Direct Dial: [email or web address] Website: [email or web address]

## 2020-03-09 NOTE — Progress Notes (Signed)
This visit occurred during the SARS-CoV-2 public health emergency.  Safety protocols were in place, including screening questions prior to the visit, additional usage of staff PPE, and extensive cleaning of exam room while observing appropriate contact time as indicated for disinfecting solutions.  Subjective:     Patient ID: Patricia Mata , female    DOB: April 13, 1956 , 64 y.o.   MRN: [id number]   Chief Complaint  Patient presents with  . Diabetes    HPI  Her blood sugars have been elevated up to 200's. Blood sugar ranging 76-239 over the last few weeks she increased her Basaglar to 14 units daily.    Diabetes She presents for her follow-up diabetic visit. She has type 2 diabetes mellitus. Pertinent negatives for diabetes include no chest pain, no fatigue, no polydipsia and no polyphagia. There are no hypoglycemic complications. There are no diabetic complications. Current diabetic treatment includes oral agent (dual therapy) and insulin injections. She is compliant with treatment all of the time. She is following a generally healthy diet. There is no change in her home blood glucose trend.     Past Medical History:  Diagnosis Date  . Acute kidney injury (nontraumatic) ([place])   . Acute renal failure (ARF) ([place]) 06/08/2018  . Cancer [facility]) 2008   Colon   . Diabetes mellitus without complication ([place])   . Gout 09/08/2018  . Hypertension   . Macrocytic anemia      Family History  Problem Relation Age of Onset  . Hypertension Mother   . Diabetes Mother   . Cervical cancer Mother   . Heart attack Father   . Hypertension Sister   . Hypertension Brother   . Hypertension Sister   . Hypertension Sister   . Prostate cancer Brother   . HIV/AIDS Brother      Current Outpatient Medications:  .  albuterol (PROAIR HFA) 108 (90 Base) MCG/ACT inhaler, Inhale 2 puffs into the lungs every 6 (six) hours as needed for wheezing or shortness of breath., Disp: 18 g, Rfl: 2 .  allopurinol  (ZYLOPRIM) 100 MG tablet, Take 100 mg by mouth daily., Disp: , Rfl:  .  amiodarone (PACERONE) 200 MG tablet, TAKE 1 TABLET BY MOUTH EVERY DAY, Disp: 90 tablet, Rfl: 1 .  amLODipine (NORVASC) 10 MG tablet, TAKE 1 TABLET BY MOUTH EVERY DAY, Disp: 90 tablet, Rfl: 3 .  atorvastatin (LIPITOR) 40 MG tablet, Take 1 tablet (40 mg total) by mouth daily., Disp: 90 tablet, Rfl: 1 .  blood glucose meter kit and supplies, Dispense based on patient and insurance preference. Use up to four times daily as directed. (FOR ICD-10 E10.9, E11.9)., Disp: 1 each, Rfl: 3 .  Blood Glucose Monitoring Suppl (ONETOUCH ULTRALINK) w/Device KIT, Check blood sugars twice daily E11.9, Disp: 1 kit, Rfl: 0 .  cloNIDine (CATAPRES) 0.1 MG tablet, Take 1 tablet (0.1 mg total) by mouth 2 (two) times daily., Disp: 60 tablet, Rfl: 6 .  colchicine 0.6 MG tablet, TAKE 1 TABLET BY MOUTH TWICE A DAY AS NEEDED FOR GOUT, Disp: 180 tablet, Rfl: 1 .  dextromethorphan-guaiFENesin (MUCINEX DM) 30-600 MG 12hr tablet, Take 1 tablet by mouth 2 (two) times daily., Disp: 30 tablet, Rfl: 0 .  doxazosin (CARDURA) 1 MG tablet, Take 2 mg by mouth at bedtime., Disp: , Rfl:  .  fluticasone (FLONASE) 50 MCG/ACT nasal spray, Place 1 spray into both nostrils daily., Disp: 16 g, Rfl: 2 .  furosemide (LASIX) 40 MG tablet, Take 1 tablet by  mouth once daily, Disp: 90 tablet, Rfl: 0 .  glucose blood test strip, check blood sugars twice daily Dx code E11.22, Disp: 100 each, Rfl: 3 .  hydrALAZINE (APRESOLINE) 100 MG tablet, Take 1 tablet (100 mg total) by mouth 3 (three) times daily., Disp: 90 tablet, Rfl: 3 .  Insulin Glargine (BASAGLAR KWIKPEN) 100 UNIT/ML, Inject 0.14 mLs (14 Units total) into the skin daily., Disp: 5 pen, Rfl: 1 .  Insulin Pen Needle 29G X 5MM MISC, Use as directed, Disp: 200 each, Rfl: 0 .  Lancets (ONETOUCH DELICA PLUS [id number]) MISC, check blood sugars twice daily Dx code: E11.22, Disp: 100 each, Rfl: 3 .  latanoprost (XALATAN) 0.005 %  ophthalmic solution, Place 1 drop into both eyes at bedtime. , Disp: , Rfl:  .  levothyroxine (SYNTHROID) 112 MCG tablet, Take 1 tablet (112 mcg total) by mouth daily., Disp: 30 tablet, Rfl: 11 .  linagliptin (TRADJENTA) 5 MG TABS tablet, Take 1 tablet (5 mg total) by mouth daily., Disp: 90 tablet, Rfl: 1 .  loratadine (CLARITIN) 10 MG tablet, Take 1 tablet (10 mg total) by mouth daily., Disp: 30 tablet, Rfl: 11 .  Multiple Vitamins-Minerals (CENTRUM SILVER 50+WOMEN) TABS, Take 1 tablet by mouth daily., Disp: , Rfl:  .  oxymetazoline (AFRIN) 0.05 % nasal spray, Place 1 spray into both nostrils 2 (two) times daily as needed for congestion., Disp: , Rfl:  .  sildenafil (REVATIO) 20 MG tablet, Take 1 tablet (20 mg total) by mouth 3 (three) times daily., Disp: 270 tablet, Rfl: 3   Allergies  Allergen Reactions  . Ancef [Cefazolin] Itching    Severe itching- after procedure, ancef was the antibiotic.-04/02/17 Tolerates penicillins  . Lactose Intolerance (Gi) Diarrhea  . Sulfa Antibiotics Rash and Other (See Comments)    Blisters, also     Review of Systems  Constitutional: Negative for fatigue.  Respiratory: Negative.   Cardiovascular: Negative.  Negative for chest pain, palpitations and leg swelling.  Endocrine: Negative for polydipsia and polyphagia.  Psychiatric/Behavioral: Negative.      Today's Vitals   03/09/20 1140  BP: 140/64  Pulse: 68  Temp: 97.8 F (36.6 C)  TempSrc: Oral  Weight: 206 lb (93.4 kg)  Height: _0  (1.626 m)  PainSc: 0-No pain   Body mass index is 35.36 kg/m.   Objective:  Physical Exam Constitutional:      General: She is not in acute distress.    Appearance: Normal appearance. She is well-developed. She is obese.  Cardiovascular:     Rate and Rhythm: Normal rate and regular rhythm.     Pulses: Normal pulses.     Heart sounds: Normal heart sounds. No murmur heard.   Pulmonary:     Effort: Pulmonary effort is normal. No respiratory distress.      Breath sounds: Normal breath sounds. No wheezing.  Chest:     Chest wall: No tenderness.  Musculoskeletal:        General: Normal range of motion.  Skin:    General: Skin is warm and dry.     Capillary Refill: Capillary refill takes less than 2 seconds.  Neurological:     General: No focal deficit present.     Mental Status: She is alert and oriented to person, place, and time.  Psychiatric:        Mood and Affect: Mood normal.        Behavior: Behavior normal.        Thought Content: Thought content normal.  Judgment: Judgment normal.         Assessment And Plan:     1. Type 2 diabetes mellitus with stage 3 chronic kidney disease, with long-term current use of insulin, unspecified whether stage 3a or 3b CKD ([place])  She is having elevated blood sugars and is doing better while on Basaglar  Will refer to CCM to help with Tradjenta and Micro - Ambulatory referral to Chronic Care Management Services  2. Essential hypertension  Chronic, fairly controlled  Continue with medications  3. Other specified hypothyroidism  Chronic, fair control  Continue with current medications, will make changes as necessary pending labs     Patient was given opportunity to ask questions. Patient verbalized understanding of the plan and was able to repeat key elements of the plan. All questions were answered to their satisfaction.  [name], FNP   I, [name], FNP, have reviewed all documentation for this visit. The documentation on 03/17/20 for the exam, diagnosis, procedures, and orders are all accurate and complete.  THE PATIENT IS ENCOURAGED TO PRACTICE SOCIAL DISTANCING DUE TO THE COVID-19 PANDEMIC.

## 2020-03-09 NOTE — Patient Instructions (Signed)
Increase your basaglar to 16 units today, Friday and Saturday then on Sunday if your blood sugar is above 150 increase basaglar to 18 units on Sunday, Monday and Tuesday. Call on Tuesday with your blood sugar readings. If blood sugar is low and you are feeling shaky check your blood sugar later in the day and is over 150 then take your Basaglar.

## 2020-03-14 ENCOUNTER — Other Ambulatory Visit: Payer: Self-pay | Admitting: *Deleted

## 2020-03-14 DIAGNOSIS — D500 Iron deficiency anemia secondary to blood loss (chronic): Secondary | ICD-10-CM

## 2020-03-15 ENCOUNTER — Other Ambulatory Visit: Payer: Self-pay

## 2020-03-15 ENCOUNTER — Inpatient Hospital Stay: Payer: Medicare Other | Attending: Oncology

## 2020-03-15 ENCOUNTER — Other Ambulatory Visit: Payer: Self-pay | Admitting: *Deleted

## 2020-03-15 ENCOUNTER — Inpatient Hospital Stay (HOSPITAL_BASED_OUTPATIENT_CLINIC_OR_DEPARTMENT_OTHER): Payer: Medicare Other | Admitting: Oncology

## 2020-03-15 ENCOUNTER — Inpatient Hospital Stay: Payer: Medicare Other

## 2020-03-15 ENCOUNTER — Telehealth: Payer: Self-pay

## 2020-03-15 VITALS — BP 181/48 | HR 57 | Temp 98.5°F | Resp 17

## 2020-03-15 DIAGNOSIS — D649 Anemia, unspecified: Secondary | ICD-10-CM

## 2020-03-15 DIAGNOSIS — D500 Iron deficiency anemia secondary to blood loss (chronic): Secondary | ICD-10-CM

## 2020-03-15 DIAGNOSIS — D469 Myelodysplastic syndrome, unspecified: Secondary | ICD-10-CM | POA: Insufficient documentation

## 2020-03-15 DIAGNOSIS — Z23 Encounter for immunization: Secondary | ICD-10-CM | POA: Insufficient documentation

## 2020-03-15 LAB — CBC WITH DIFFERENTIAL (CANCER CENTER ONLY)
Abs Immature Granulocytes: 0.06 10*3/uL (ref 0.00–0.07)
Basophils Absolute: 0 10*3/uL (ref 0.0–0.1)
Basophils Relative: 0 %
Eosinophils Absolute: 0 10*3/uL (ref 0.0–0.5)
Eosinophils Relative: 0 %
HCT: 23.2 % — ABNORMAL LOW (ref 36.0–46.0)
Hemoglobin: 8 g/dL — ABNORMAL LOW (ref 12.0–15.0)
Immature Granulocytes: 1 %
Lymphocytes Relative: 7 %
Lymphs Abs: 0.3 10*3/uL — ABNORMAL LOW (ref 0.7–4.0)
MCH: 31.1 pg (ref 26.0–34.0)
MCHC: 34.5 g/dL (ref 30.0–36.0)
MCV: 90.3 fL (ref 80.0–100.0)
Monocytes Absolute: 0.6 10*3/uL (ref 0.1–1.0)
Monocytes Relative: 12 %
Neutro Abs: 4 10*3/uL (ref 1.7–7.7)
Neutrophils Relative %: 80 %
Platelet Count: 173 10*3/uL (ref 150–400)
RBC: 2.57 MIL/uL — ABNORMAL LOW (ref 3.87–5.11)
RDW: 22.4 % — ABNORMAL HIGH (ref 11.5–15.5)
WBC Count: 5 10*3/uL (ref 4.0–10.5)
nRBC: 1.4 % — ABNORMAL HIGH (ref 0.0–0.2)

## 2020-03-15 LAB — CMP (CANCER CENTER ONLY)
ALT: 17 U/L (ref 0–44)
AST: 29 U/L (ref 15–41)
Albumin: 3.7 g/dL (ref 3.5–5.0)
Alkaline Phosphatase: 94 U/L (ref 38–126)
Anion gap: 10 (ref 5–15)
BUN: 58 mg/dL — ABNORMAL HIGH (ref 8–23)
CO2: 19 mmol/L — ABNORMAL LOW (ref 22–32)
Calcium: 9.7 mg/dL (ref 8.9–10.3)
Chloride: 106 mmol/L (ref 98–111)
Creatinine: 2.23 mg/dL — ABNORMAL HIGH (ref 0.44–1.00)
GFR, Est AFR Am: 26 mL/min — ABNORMAL LOW (ref >60)
GFR, Estimated: 22 mL/min — ABNORMAL LOW (ref >60)
Glucose, Bld: 154 mg/dL — ABNORMAL HIGH (ref 70–99)
Potassium: 4.2 mmol/L (ref 3.5–5.1)
Sodium: 135 mmol/L (ref 135–145)
Total Bilirubin: 0.9 mg/dL (ref 0.3–1.2)
Total Protein: 7.3 g/dL (ref 6.5–8.1)

## 2020-03-15 LAB — SAMPLE TO BLOOD BANK

## 2020-03-15 LAB — PREPARE RBC (CROSSMATCH)

## 2020-03-15 MED ORDER — ACETAMINOPHEN 325 MG PO TABS
ORAL_TABLET | ORAL | Status: AC
  Filled 2020-03-15: qty 2

## 2020-03-15 MED ORDER — DIPHENHYDRAMINE HCL 25 MG PO CAPS
ORAL_CAPSULE | ORAL | Status: AC
  Filled 2020-03-15: qty 1

## 2020-03-15 MED ORDER — FUROSEMIDE 10 MG/ML IJ SOLN
20.0000 mg | Freq: Once | INTRAMUSCULAR | Status: AC
  Administered 2020-03-15: 20 mg via INTRAVENOUS

## 2020-03-15 MED ORDER — FUROSEMIDE 10 MG/ML IJ SOLN
INTRAMUSCULAR | Status: AC
  Filled 2020-03-15: qty 2

## 2020-03-15 MED ORDER — ACETAMINOPHEN 325 MG PO TABS
650.0000 mg | ORAL_TABLET | Freq: Once | ORAL | Status: AC
  Administered 2020-03-15: 650 mg via ORAL

## 2020-03-15 MED ORDER — SODIUM CHLORIDE 0.9% IV SOLUTION
250.0000 mL | Freq: Once | INTRAVENOUS | Status: AC
  Administered 2020-03-15: 250 mL via INTRAVENOUS
  Filled 2020-03-15: qty 250

## 2020-03-15 MED ORDER — LUSPATERCEPT-AAMT 75 MG ~~LOC~~ SOLR
1.3300 mg/kg | Freq: Once | SUBCUTANEOUS | Status: AC
  Administered 2020-03-15: 115 mg via SUBCUTANEOUS
  Filled 2020-03-15: qty 1.5

## 2020-03-15 MED ORDER — DIPHENHYDRAMINE HCL 25 MG PO CAPS
25.0000 mg | ORAL_CAPSULE | Freq: Once | ORAL | Status: AC
  Administered 2020-03-15: 25 mg via ORAL

## 2020-03-15 NOTE — Chronic Care Management (AMB) (Signed)
Chronic Care Management Pharmacy Assistant   Name: Patricia Mata  MRN: [id number] DOB: 1956/04/25  Reason for Encounter: Medication Review/ Initial Question for Initial Pharmacist visit.  Patient Questions: Have you seen any other providers since your last visit? Yes- 03/13/20- Dr Patricia Mata (Ophthalmology), 03/15/2020- Dr Patricia Mata (Oncology) Any changes in your medications or health? Yes- Xalatan 0.005% one drop both eyes at bedtime given 03/13/20.  Called patient for initial questions for initial visit with Pharmacist Patricia Mata, CPP on 03/16/20 at 2 pm. No answer, left message to please bring all medications, supplements, any blood pressure or blood sugar logs to appointment.  PCP : Patricia Brine, FNP  Allergies:   Allergies  Allergen Reactions  . Ancef [Cefazolin] Itching    Severe itching- after procedure, ancef was the antibiotic.-04/02/17 Tolerates penicillins  . Lactose Intolerance (Gi) Diarrhea  . Sulfa Antibiotics Rash and Other (See Comments)    Blisters, also    Medications: Outpatient Encounter Medications as of 03/15/2020  Medication Sig  . albuterol (PROAIR HFA) 108 (90 Base) MCG/ACT inhaler Inhale 2 puffs into the lungs every 6 (six) hours as needed for wheezing or shortness of breath.  . allopurinol (ZYLOPRIM) 100 MG tablet Take 100 mg by mouth daily.  Marland Kitchen amiodarone (PACERONE) 200 MG tablet TAKE 1 TABLET BY MOUTH EVERY DAY  . amLODipine (NORVASC) 10 MG tablet TAKE 1 TABLET BY MOUTH EVERY DAY  . atorvastatin (LIPITOR) 40 MG tablet Take 1 tablet (40 mg total) by mouth daily.  . blood glucose meter kit and supplies Dispense based on patient and insurance preference. Use up to four times daily as directed. (FOR ICD-10 E10.9, E11.9).  Marland Kitchen Blood Glucose Monitoring Suppl (ONETOUCH ULTRALINK) w/Device KIT Check blood sugars twice daily E11.9  . cloNIDine (CATAPRES) 0.1 MG tablet Take 1 tablet (0.1 mg total) by mouth 2 (two) times daily.  . colchicine 0.6 MG tablet TAKE 1  TABLET BY MOUTH TWICE A DAY AS NEEDED FOR GOUT  . dextromethorphan-guaiFENesin (MUCINEX DM) 30-600 MG 12hr tablet Take 1 tablet by mouth 2 (two) times daily.  Marland Kitchen doxazosin (CARDURA) 1 MG tablet Take 2 mg by mouth at bedtime.  . fluticasone (FLONASE) 50 MCG/ACT nasal spray Place 1 spray into both nostrils daily.  . furosemide (LASIX) 40 MG tablet Take 1 tablet by mouth once daily  . glucose blood test strip check blood sugars twice daily Dx code E11.22  . hydrALAZINE (APRESOLINE) 100 MG tablet Take 1 tablet (100 mg total) by mouth 3 (three) times daily.  . Insulin Glargine (BASAGLAR KWIKPEN) 100 UNIT/ML Inject 0.14 mLs (14 Units total) into the skin daily.  . Insulin Pen Needle 29G X 5MM MISC Use as directed  . Lancets (ONETOUCH DELICA PLUS [id number]) MISC check blood sugars twice daily Dx code: E11.22  . latanoprost (XALATAN) 0.005 % ophthalmic solution Place 1 drop into both eyes at bedtime.   Marland Kitchen levothyroxine (SYNTHROID) 112 MCG tablet Take 1 tablet (112 mcg total) by mouth daily.  Marland Kitchen linagliptin (TRADJENTA) 5 MG TABS tablet Take 1 tablet (5 mg total) by mouth daily.  Marland Kitchen loratadine (CLARITIN) 10 MG tablet Take 1 tablet (10 mg total) by mouth daily.  . Multiple Vitamins-Minerals (CENTRUM SILVER 50+WOMEN) TABS Take 1 tablet by mouth daily.  Marland Kitchen oxymetazoline (AFRIN) 0.05 % nasal spray Place 1 spray into both nostrils 2 (two) times daily as needed for congestion.  . sildenafil (REVATIO) 20 MG tablet Take 1 tablet (20 mg total) by mouth 3 (three) times daily.  No facility-administered encounter medications on file as of 03/15/2020.    Current Diagnosis: Patient Active Problem List   Diagnosis Date Noted  . Chronic kidney disease, stage 5 ([place]) 02/03/2020  . Purulent bronchitis ([place]) 01/28/2020  . Acute diastolic CHF (congestive heart failure) ([place]) 09/21/2019  . ARDS (adult respiratory distress syndrome) ([place])   . Acute respiratory failure with hypoxia ([employer]) 09/20/2019  . HCAP  (healthcare-associated pneumonia) 09/20/2019  . Pulmonary hypertension ([place]) 09/20/2019  . CKD (chronic kidney disease), stage III 09/20/2019  . DM2 (diabetes mellitus, type 2) ([place]) 09/20/2019  . Elevated troponin 09/20/2019  . Severe sepsis ([place]) 09/20/2019  . CAP (community acquired pneumonia) 09/14/2019  . Hypoxia 09/14/2019  . Neutropenia ([place]) 05/25/2019  . Malignant neoplasm of colon ([place]) 05/25/2019  . Bradycardia 03/21/2019  . Pneumonia due to COVID-19 virus 03/18/2019  . Type 2 diabetes mellitus with hemoglobin A1c goal of less than 7.0% ([place]) 12/10/2018  . Hypothyroidism 12/10/2018  . Seasonal allergies 12/10/2018  . Gout 09/08/2018  . 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 Sacral wound 06/08/2018  . Iron deficiency anemia due to chronic blood loss 06/08/2018  . Hyponatremia 06/08/2018  . Constipation 06/08/2018  . Encounter for nasogastric (NG) tube placement   . Pressure injury of skin 05/22/2018  . Acute hypoxemic respiratory failure ([place])   . Cardiogenic shock ([place])   . Atrial fibrillation, transient ([place]) 05/11/2018  . Normocytic anemia 06/14/2017  . Severe aortic stenosis 06/10/2017  . Carotid artery disease ([place]) 06/10/2017  . Essential hypertension 05/13/2017  . Hyperlipidemia 05/13/2017  . Bilateral lower extremity edema 05/13/2017  . Port-A-Cath in place 04/28/2017  . 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 Goals of care, counseling/discussion 03/20/2017  . Anemia in chronic kidney disease 05/10/2016  . Anemia in stage 1 chronic kidney disease 05/10/2016  . Anemia due to chronic kidney disease 02/09/2016     Follow-Up:  Pharmacist Review- Called patient, no ans, Left message of appointment reminder and items to bring.  Patricia Mata, [place] Pharmacist Assistant [phone]

## 2020-03-15 NOTE — Progress Notes (Signed)
Hematology and Oncology Follow Up Visit  Patricia Mata [id number] March 27, 1956 64 y.o. 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  Her IPSS-R score of 2.5 at this time.  Secondary diagnosis: Stage III colon cancer diagnosed in 2008.  She has no evidence of relapse and presumably cured at this time.    Prior Therapy: She is S/P hemicolectomy done in 11/2006. 1/13 lymph nodes are positive.  This was followed by 12 cycles of FOLFOX completed in 05/2007  Aranesp 300 mcg every 2 to 3 weeks therapy was ineffective to eliminate transfusion needs.  5-azacytidine at 75 mg/m square subcutaneously started in October 2018.  She is status post 7 cycles of therapy completed in April 2019.   Current therapy:   Reblozyl 75 mg subcutaneous injection every 3 weeks started in December 2021.    Supportive affect red cell transfusion as needed.   Interim History:  Patricia Mata returns today for repeat evaluation.  Since last visit, she reports no major changes in health.  She continues to be active and attends activities of daily living.  She denies any recent hospitalization or illnesses.  She denies any shortness of breath or dyspnea on exertion.  She denies any fevers chills or sweats.   Updated on review. Current Outpatient Medications on File Prior to Visit  Medication Sig Dispense Refill  . allopurinol (ZYLOPRIM) 100 MG tablet Take 100 mg by mouth daily.    Marland Kitchen amiodarone (PACERONE) 200 MG tablet Take 200 mg by mouth daily.    Marland Kitchen amLODipine (NORVASC) 10 MG tablet Take 1 tablet (10 mg total) by mouth daily. 90 tablet 3  . atorvastatin (LIPITOR) 40 MG tablet TAKE 1 TABLET BY MOUTH EVERY DAY 90 tablet 0  . blood glucose meter kit and supplies Dispense based on patient and insurance preference. Use up to four times daily as directed. (FOR ICD-10 E10.9, E11.9). 1 each 3  . colchicine 0.6 MG tablet Take 0.6 mg by mouth 2 (two)  times daily as needed (for gout).    Marland Kitchen doxazosin (CARDURA) 1 MG tablet Take 1 tablet (1 mg total) by mouth at bedtime. 30 tablet 6  . doxycycline (ADOXA) 100 MG tablet Take 1 tablet (100 mg total) by mouth 2 (two) times daily for 7 days. 14 tablet 0  . furosemide (LASIX) 40 MG tablet TAKE 1 TABLET BY MOUTH EVERY DAY 90 tablet 1  . gabapentin (NEURONTIN) 300 MG capsule     . glucose blood test strip Use as instructed 100 each 12  . hydrALAZINE (APRESOLINE) 100 MG tablet Take 1 tablet (100 mg total) by mouth 3 (three) times daily. 180 tablet 5  . insulin glargine (LANTUS) 100 unit/mL SOPN Inject 0.15 mLs (15 Units total) into the skin daily. (Patient taking differently: Inject 15 Units into the skin daily as needed (blood sugar over 150). )    . Insulin Pen Needle 29G X 5MM MISC Use as directed 200 each 0  . latanoprost (XALATAN) 0.005 % ophthalmic solution Place 1 drop into both eyes at bedtime.     Marland Kitchen levothyroxine (SYNTHROID) 88 MCG tablet Take 1 tablet (88 mcg total) by mouth daily. 30 tablet 2  . metoprolol succinate (TOPROL-XL) 25 MG 24 hr tablet Take 1 tablet (25 mg total) by mouth daily. STOP taking this unless heart rate >100bpm OR directed by your doctor 30 tablet 5  . Multiple Vitamins-Minerals (CENTRUM SILVER 50+WOMEN) TABS Take 1  tablet by mouth daily.    . sildenafil (REVATIO) 20 MG tablet Take 1 tablet (20 mg total) by mouth 3 (three) times daily. 270 tablet 3  . sitaGLIPtin (JANUVIA) 50 MG tablet Take 1 tablet (50 mg total) by mouth daily. 90 tablet 0               Allergies:  Allergies  Allergen Reactions  . Ancef [Cefazolin] Itching    Severe itching- after procedure, ancef was the antibiotic.-04/02/17 Tolerates penicillins  . Lactose Intolerance (Gi) Diarrhea  . Sulfa Antibiotics Rash and Other (See Comments)    Blisters, also     Physical Exam:         ECOG: 1    General appearance: Comfortable appearing without any discomfort Head: Normocephalic  without any trauma Oropharynx: Mucous membranes are moist and pink without any thrush or ulcers. Eyes: Pupils are equal and round reactive to light. Lymph nodes: No cervical, supraclavicular, inguinal or axillary lymphadenopathy.   Heart:regular rate and rhythm.  S1 and S2 without leg edema. Lung: Clear without any rhonchi or wheezes.  No dullness to percussion. Abdomin: Soft, nontender, nondistended with good bowel sounds.  No hepatosplenomegaly. Musculoskeletal: No joint deformity or effusion.  Full range of motion noted. Neurological: No deficits noted on motor, sensory and deep tendon reflex exam. Skin: No petechial rash or dryness.  Appeared moist.                      Lab Results: Lab Results  Component Value Date   WBC 5.0 03/15/2020   HGB 8.0 (L) 03/15/2020   HCT 23.2 (L) 03/15/2020   MCV 90.3 03/15/2020   PLT 173 03/15/2020     Chemistry      Component Value Date/Time   NA 135 03/15/2020 1013   NA 136 02/03/2020 1231   NA 134 (L) 05/26/2017 0949   K 4.2 03/15/2020 1013   K 4.0 05/26/2017 0949   CL 106 03/15/2020 1013   CL 96 (L) 03/31/2012 1513   CO2 19 (L) 03/15/2020 1013   CO2 24 05/26/2017 0949   BUN 58 (H) 03/15/2020 1013   BUN 46 (H) 02/03/2020 1231   BUN 29.2 (H) 05/26/2017 0949   CREATININE 2.23 (H) 03/15/2020 1013   CREATININE 2.24 (H) 01/27/2019 1355   CREATININE 1.4 (H) 05/26/2017 0949      Component Value Date/Time   CALCIUM 9.7 03/15/2020 1013   CALCIUM 8.9 05/26/2017 0949   ALKPHOS 94 03/15/2020 1013   ALKPHOS 157 (H) 05/26/2017 0949   AST 29 03/15/2020 1013   AST 29 05/26/2017 0949   ALT 17 03/15/2020 1013   ALT 23 05/26/2017 0949   BILITOT 0.9 03/15/2020 1013   BILITOT 2.19 (H) 05/26/2017 0949      Impression and Plan:  64 year old woman with:   1.  Myelodysplastic syndrome presented with anemia in 2017.    She has tolerated Reblozyl without any new complications.  Risks and benefits of continuing this treatment  long-term were reviewed and potential alternative options were discussed.  At this time she appears to have less transfusion needs and will continue with this current plan.  2.  Anemia: Hemoglobin currently at 8.4 and she will receive 1 unit of packed red cell transfusion.   3. Hypertension: Under control at this time.  No recent exacerbation.   4.  Aortic stenosis: She continues to follow with cardiology without any recent heart failure exacerbation.   5.  Prognosis:  Her disease is incurable although performance status is excellent and aggressive measures are warranted.  6.  Covid vaccination considerations: Given her immunocompromise status, I recommended proceeding with Covid booster.  7. Follow-up: She will return every 3 weeks for injection and MD follow-up in 9 weeks.  30  minutes were dedicated to this encounter.  The time was spent on reviewing her disease status, discussing treatment options and future plan of care review.   Zola Button, MD 9/1/202112:52 PM

## 2020-03-16 ENCOUNTER — Ambulatory Visit: Payer: Medicare Other

## 2020-03-16 ENCOUNTER — Telehealth: Payer: Self-pay

## 2020-03-16 DIAGNOSIS — E1122 Type 2 diabetes mellitus with diabetic chronic kidney disease: Secondary | ICD-10-CM

## 2020-03-16 DIAGNOSIS — N183 Chronic kidney disease, stage 3 unspecified: Secondary | ICD-10-CM

## 2020-03-16 DIAGNOSIS — E7800 Pure hypercholesterolemia, unspecified: Secondary | ICD-10-CM

## 2020-03-16 DIAGNOSIS — Z794 Long term (current) use of insulin: Secondary | ICD-10-CM

## 2020-03-16 DIAGNOSIS — E039 Hypothyroidism, unspecified: Secondary | ICD-10-CM

## 2020-03-16 DIAGNOSIS — I10 Essential (primary) hypertension: Secondary | ICD-10-CM

## 2020-03-16 LAB — TYPE AND SCREEN
ABO/RH(D): A NEG
Antibody Screen: POSITIVE
DAT, IgG: NEGATIVE
Unit division: 0

## 2020-03-16 LAB — BPAM RBC
Blood Product Expiration Date: 202109292359
ISSUE DATE / TIME: 202109011339
Unit Type and Rh: 600

## 2020-03-16 NOTE — Telephone Encounter (Signed)
Pt called blood sugars Tuesday-124 Wednesday-133 Thursday-219  She went to cancer center early Wednesday to give herself insulin and maybe that's why its up

## 2020-03-16 NOTE — Chronic Care Management (AMB) (Signed)
Chronic Care Management Pharmacy  Name: Patricia Mata  MRN: [id number] DOB: 17-Jan-1956  Chief Complaint/ HPI  Patricia Mata,  64 y.o. , female presents for their Initial CCM visit with the clinical pharmacist In office.  PCP : [name], FNP  Their chronic conditions include: Hypertension, Hyperlipidemia, Diabetes, Atrial Fibrillation, Heart Failure, Chronic Kidney Disease and Hypothyroidism  Office Visits: 03/09/20 OV: Referred to CCM pharmacist for assistance with [name] and Basaglar cost. Increase Basaglar to 16 units today, Friday and Saturday, then on Sunday if BG is above 150 increase to 18 units daily Sunday, Monday, and Tuesday.   02/03/20 OV: HgbA1c stable. Kidney function stable. Liver function normal. Thyroid levels are the same. Pt has been taking levothyroxine 19mg 1 tablet Monday through Friday and 2 tablets Saturday and Sunday. Pt advised to increase to 1126m once daily (Rx sent).   12/14/19 Telephone call: Take Basaglar 10 units daily and follow up in 8 weeks. Send BG to office every Mond and ThCidraTake levothyroxine on Sat and Sun, then recheck her thyroid studies. Stop using the Afrin that can cause more nosebleeds. Send a steroid inhaler to take 2 puffs 2 times a day to see if it helps with her cough.   12/07/19 OV: Thyroid levels are slightly underactive. Advised pt to take an extra tablet of levothyroxine on Sundays for 4 weeks and then return for lab recheck. BG have been elevated, she is not taking Tradjenta. Advised pt to start TrFlourtownsamples given). Productive cough with green sputum. Send for chest Xray and treat with doxycycline 10024mID for 7 days. HgbA1c has decreased to 6.1% from 6.5%.   10/05/19 TV: Hospital follow up. Referral to Home health. [place] to worsening kidney function. Referred to Nephrology. Was advised to hold HCTZ in hospital until follow up with Cardiology.   09/20/19 TV: Hospital follow up. Pt going back to  ER for evaluation due to acute respiratory distress (had difficulty holding conversation during visit). BG elevated for last few days, will most likely need to administer 10 units Lantus when she gets home from ED due to stress of illness.   Consult Visits: 03/15/20 Oncology/Hematology OV w/ Dr. 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 Current IPSS-R score of 2.5. Currently receiving Reblozyl 32m67m every 3 weeks started in December. Hgb 8.4 today, will receive transfusion of 1 unit packed red blood cells.   03/13/20 Ophthalmology OV w/ Dr. ShapGershon Cranentinue latanoprost in each eye nightly. F/U in 3 months.   02/22/20 Cardiology OV w/ Dr. AlvsBrion Aliment: Advanced HTN clinic follow up. Hydralazine increased to 100mg13mee times daily and continue other medications. Pt will clarify if she is taking 1mg o46mmg do109mosin.  02/02/20 Oncology OV w/ Dr. Shadad:Alen Blewnue current therapy and supportive transfusions as needed. Pt not really a candidate for any aggressive measures including stem cell transplant.   01/28/20 Pulmonology OV w/ Dr. Byrum: Lamonte Sakaitanding sputum for past few months. Problem has been present since COVID19 diagnosis in 03/2019. Was treated with doxycycline in May without response. Will treat with Levaquin to see if better response with additional gram-negative coverage. Plan to repeat chest CT to compare with March. Repeat PFTs. Start loratadine 10mg da66mand guaifenesin 600mg twi1maily to help with mucus burden until next visit.   01/28/20 Telephone call: [name].   01/14/20 Telephone call: contacted pt regarding referral for PREP from  HTN clinic. Will call back once dates are confirmed.   01/10/20 Cardiology OV w/ Dr. Oval Linsey (HTN clinic): Presented to establish care with HTN clinic. Anticipate restarting diuresis tomorrow as renal function allows following contrast administration today. Consider  advanced heart failure consultation to assist with management of HFpEF and severe pulmonary HTN. BP elevated initially and on repeat. Improved since starting clonidine but still above goal. Restart hydralazine 38m three times daily. Given HTN clinic booklet to record BP. Check renal artery dopplers for renal artery stenosis. Consents to remote monitoring through [place] Avoiding ACE inhibitors and ARBS due to CKD. Interested in prep program with [facility]. Continue other medications. Follow up in 1 month.   12/30/19 Cardiology telephone call: Pt called complaining os swollen ankles and fingers. States her weight is up 5 lbs. Asked what changes she needs to make.   12/09/19 Ophthalmology OV w/ Dr. 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 IOP under good control. F/U in 3 months.   11/30/19 Cardiology OV w/ Dr. BHaroldine Laws Routine f/u. Pt feeling okay. BP has been elevated. SBP 170 at nephrologist OV earlier this week. Doxazosin was increased from 147mto 55m94mt bedtime. Volume status okay. Continue Lasix. Refer to Advanced HTN clinic for follow up of markedly elevated BP. Start clonidine 1mg4mD. Continue amlodipine 10mg60mly and doxazosin 55mg d63my.   11/29/19 Cardiology telephone call: PA for sildenafil approved.  11/10/19 Oncology OV w/ Dr. ShadadAlen Blewented for repeat evaluation. Continue Reblozyl. Hgb adequate. No transfusion required today. BP elevated.   11/09/19 Mammogram  10/20/19 Infusion due to HgbA1c 7.9  10/12/19 Cardiology OV w/ B. Simmons (Advanced Heart Failure clinic): Pt improved today since hospital discharge. Continue furosemide 40mg d84m. NYHA II-III, euvolemic and stable. BP mildly elevated. Hydralazine was stopped at recent hospitalization due to low BP. Will not resume hydralazine at this time. F/U in 8 weeks.  09/20/19 ED admission: Presented with worsening shortness of breath not better since discharge. BG has been elevated since discharge. Chest Xray showing  signs of worsening pneumonia. Treated with IV cefepime and diuresed with IV Lasix. Follow up with heart failure clinic on 3/30 (check BMET), check CBC in 1 [place] [place] for MDS.  09/14/19 ED admission: Community acquired pneumonia with bilateral infiltrates, increased WBC, and purulent sputum. Given Rocephin and doxycycline during hospitalization. COVID negative. Discharged on 4 days of doxycyline and Cefdinir. Follow up with PCP in 1 week. Repeat chest Xray in 3-4 weeks after completion of antibiotics.   CCM Encounters:  Medications: Outpatient Encounter Medications as of 03/16/2020  Medication Sig  . albuterol (PROAIR HFA) 108 (90 Base) MCG/ACT inhaler Inhale 2 puffs into the lungs every 6 (six) hours as needed for wheezing or shortness of breath.  . allopurinol (ZYLOPRIM) 100 MG tablet Take 200 mg by mouth daily.   . amiodMarland Kitchenrone (PACERONE) 200 MG tablet TAKE 1 TABLET BY MOUTH EVERY DAY  . amLODipine (NORVASC) 10 MG tablet Take 1 tablet (10 mg total) by mouth daily.  . atorvMarland Kitchenstatin (LIPITOR) 40 MG tablet Take 1 tablet (40 mg total) by mouth daily.  . blood glucose meter kit and supplies Dispense based on patient and insurance preference. Use up to four times daily as directed. (FOR ICD-10 E10.9, E11.9).  . BloodMarland KitchenGlucose Monitoring Suppl (ONETOUCH ULTRALINK) w/Device KIT Check blood sugars twice daily E11.9  . cloNIDine (CATAPRES) 0.1 MG tablet Take 1 tablet (0.1 mg total) by mouth 2 (two)  times daily.  . colchicine 0.6 MG tablet TAKE 1 TABLET BY MOUTH TWICE A DAY AS NEEDED FOR GOUT  . doxazosin (CARDURA) 4 MG tablet Take 1 tablet (4 mg total) by mouth at bedtime.  . fluticasone (FLONASE) 50 MCG/ACT nasal spray Place 1 spray into both nostrils daily.  . furosemide (LASIX) 40 MG tablet Take 1 tablet by mouth once daily  . glucose blood test strip check blood sugars twice daily Dx code E11.22  . hydrALAZINE (APRESOLINE) 100 MG tablet Take 1  tablet (100 mg total) by mouth 3 (three) times daily.  . Insulin Glargine (BASAGLAR KWIKPEN) 100 UNIT/ML Inject 0.14 mLs (14 Units total) into the skin daily. (Patient taking differently: Inject 16 Units into the skin daily. )  . Insulin Pen Needle 29G X 5MM MISC Use as directed  . Lancets (ONETOUCH DELICA PLUS [id number]) MISC check blood sugars twice daily Dx code: E11.22  . latanoprost (XALATAN) 0.005 % ophthalmic solution Place 1 drop into both eyes at bedtime.   Marland Kitchen linagliptin (TRADJENTA) 5 MG TABS tablet Take 1 tablet (5 mg total) by mouth daily.  . Multiple Vitamins-Minerals (CENTRUM SILVER 50+WOMEN) TABS Take 1 tablet by mouth daily.  Marland Kitchen oxymetazoline (AFRIN) 0.05 % nasal spray Place 1 spray into both nostrils 2 (two) times daily as needed for congestion.  . sildenafil (REVATIO) 20 MG tablet TAKE 1 TABLET BY MOUTH THREE TIMES DAILY  . dextromethorphan-guaiFENesin (MUCINEX DM) 30-600 MG 12hr tablet Take 1 tablet by mouth 2 (two) times daily.  Marland Kitchen levothyroxine (SYNTHROID) 112 MCG tablet Take 1 tablet (112 mcg total) by mouth daily.  Marland Kitchen loratadine (CLARITIN) 10 MG tablet Take 1 tablet (10 mg total) by mouth daily. (Patient not taking: Reported on 04/07/2020)  . [DISCONTINUED] amLODipine (NORVASC) 10 MG tablet TAKE 1 TABLET BY MOUTH EVERY DAY  . [DISCONTINUED] doxazosin (CARDURA) 1 MG tablet Take 2 mg by mouth at bedtime.  . [DISCONTINUED] sildenafil (REVATIO) 20 MG tablet Take 1 tablet (20 mg total) by mouth 3 (three) times daily.   No facility-administered encounter medications on file as of 03/16/2020.    Current Diagnosis/Assessment:  SDOH Interventions     Most Recent Value  SDOH Interventions  Financial Strain Interventions Other (Comment)  [Formulary review performed and complete patient assistance application for Tradjenta]      Goals Addressed            This Visit's Progress   . Pharmacy Care Plan       CARE PLAN ENTRY (see longitudinal plan of care for additional care plan  information)  Current Barriers:  . Chronic Disease Management support, education, and care coordination needs related to Hypertension, Hyperlipidemia, Diabetes, and Hypothyroidism   Hypertension BP Readings from Last 3 Encounters:  03/29/20 (!) 170/58  03/28/20 (!) 154/60  03/23/20 140/60   . Pharmacist Clinical Goal(s): o Over the next 90 days, patient will work with PharmD and providers to achieve BP goal <130/80 . Current regimen:  o Amlodipine 25m daily o Clonidine 0.187mtwice daily o Doxazosin 79m19maily o Furosemide 24m66mily o Hydralazine 100mg54mee times daily o Sildenafil 20mg 2me times daily . Interventions: o Provided dietary and exercise recommendations o Reviewed medications changes by Advanced Hypertension Clinic; discussed with patient . Patient self care activities - Over the next 90 days, patient will: o Check BP daily, document, and provide at future appointments o Ensure daily salt intake < 2300 mg/day o Increase exercise with goal of at least 150 minutes weekly (  more if tolerated) o Use alternative seasoning to salt (Garlic, pepper, etc)  Hyperlipidemia Lab Results  Component Value Date/Time   LDLCALC 86 03/20/2019 04:06 AM   . Pharmacist Clinical Goal(s): o Over the next 90 days, patient will work with PharmD and providers to achieve LDL goal < 70 . Current regimen:  o Atorvastatin $RemoveBefore'40mg'ZtJfpWWUAzDtf$  daily . Interventions: o Provided dietary and exercise recommendations o Discussed appropriate goals for LDL (less than 70) and HDL (greater than 50) . Patient self care activities - Over the next 90 days, patient will: o Increase exercise with goal of at least 150 minutes weekly (more if tolerated) o Increase intake of healthy fats in diet (avocados, walnuts, fish, flaxseed, etc)  Diabetes Lab Results  Component Value Date/Time   HGBA1C 6.2 (H) 02/03/2020 12:31 PM   HGBA1C 6.1 (H) 12/07/2019 12:35 PM   . Pharmacist Clinical Goal(s): o Over the next 180  days, patient will work with PharmD and providers to maintain A1c goal <7% . Current regimen:  o Basaglar 16 units daily o Tradjenta $RemoveBef'5mg'nYttashLoy$  daily . Interventions: o Provided dietary and exercise recommendations o Discussed appropriate goal for fasting blood sugar (80-130) o Hypoglycemia (blood sugar less than 70) and treatment o Formulary review of [employer] Medicare HMO and [place] covered under [place] (90 day supply for $0 copay), not covered under [facility] . Contacted [facility] and advised them to English as a second language teacher under [place] and confirmed $0 copay - Tradjenta not covered under [employer] or [place] . Start patient assistance application . Gave 1 week of samples (patient has 3 weeks total on hand) o Tradjenta patient assistance application through [employer] - Patient provided proof of income documents and signed application - PCP signed healthcare provider portion of application - Completed application faxed to [facility] . Patient self care activities - Over the next 180 days, patient will: o Check blood sugar once daily, document, and provide at future appointments o Contact provider with any episodes of hypoglycemia o Increase exercise with goal of at least 150 minutes weekly (more if tolerated) o Increase water intake to 64 ounces daily o Utilize PLATE method for meal planning (provided education)  Hypothyroidism . Pharmacist Clinical Goal(s) o Over the next 90 days, patient will work with PharmD and providers to achieve thyroid hormone levels within normal limits . Current regimen:  o Levothyroxine 76mcg . Interventions: o Patient was advised to increase to levothyroxine 162mcg tablets daily on 8/16 but has been taking 2 tablets of 39mcg until she completes her current supply of 75mcg (about 1 week left) o Collaborated with PCP regarding patient dosing of levothyroxine o Advised patient to alternate 1  tablets of 94mcg and 2 tablets of 44mcg every other day and then come in for lab work to evaluate thyroid levels o Discussed possibility of switching to name brand Synthroid to decrease variability . Patient self care activities - Over the next 90 days, patient will: o Alternate 1 tablet of 46mcg and 2 tablets of 34mcg levothyroxine every other day until finished and then come into the office fir thyroid hormone labwork  Medication management . Pharmacist Clinical Goal(s): o Over the next 180 days, patient will work with PharmD and providers to maintain optimal medication adherence . [facility] . Interventions o Comprehensive medication review performed. o Continue current medication management strategy . Patient self care activities - Over the next 180 days, patient  will: o Focus on medication adherence by continued use of pill box o Take medications as prescribed o Report any questions or concerns to PharmD and/or provider(s)  Initial goal documentation        Diabetes   A1c goal <7%  Recent Relevant Labs: Lab Results  Component Value Date/Time   HGBA1C 6.2 (H) 02/03/2020 12:31 PM   HGBA1C 6.1 (H) 12/07/2019 12:35 PM   MICROALBUR 80 06/22/2019 12:02 PM    Last diabetic Eye exam: 09/14/18 Last diabetic Foot exam: No results found for: HMDIABFOOTEX, seen by Podiatry  Checking BG: Daily  Recent FBG Readings: 154,129,162,156,219,228,201,157,100,86,163,116,124,113,219 Recent pre-meal BG readings:  Recent 2hr PP BG readings:   Recent HS BG readings:   Patient has failed these meds in past: Glimepiride, Lantus, Janumet XR, Janumet, Januvia Patient is currently controlled on the following medications: . Basaglar 16 units daily . Tradjenta $RemoveBef'5mg'cgepOGfWzo$  daily  We discussed:  . Pt BG provided are most FBG, but sometimes she takes after she eats o Advised pt to be consistent with when she is checking her BG or to record if she takes it before of after meals . Formulary  review of both of patient's insurance plans ([place] PPO and [facility] Medicare Advantage HMO) o Engineer, agricultural and Tradjenta non-formulary on [employer] o Basaglar 90 day supply covered on [place] with $0 copay - Contacted [facility] and advised them to bill [place] only. Confirmed copay $0 - Notified pt at office visit that [facility] ready for pickup at [facility] 90 day supply $0 copay o Tradjenta not covered on [facility] . Pt denies side effects since starting Tradjenta . Tradjenta patient assistance program  o Pt provided proof of income documents and signed application for Tradjenta patient assistance program though [employer] o Patient assistance application provided to PCP, for signature . Gave patient 1 week of Tradjenta samples (has 3 weeks on hand total) . FBG goal 80-130 . Hypoglycemia BG <70 o Pt has candy when BG is low . Advised pt to continue 16 units of Basaglar daily o She is scheduled to call the office Tuesday to report BG readings . Diet extensively o Pt's husband cooks, she reports they do not eat out often - [employer] is a treat for her o Pt has lost a lot of weight in the last 3 years (over 100 lbs) o Typical diet includes: [place], fish, shrimp, chicken, and sometimes hamburger along with vegetables o Pt states she knows to stay away from "white foods" o PLATE method o Advised pt to limit carbohydrates and portion sizes of foods o Drinks 3-16 ounce bottles of water daily - Recommend pt drink 64 ounces of water daily o Advised pt to look at sugar content on sauce labels . Exercise extensively o [place] exercise program at the [facility] 2 times weekly for 1 hour and 15 minutes - Pt states she is planning to increase to 3 times weekly o Recommend pt get 30 minutes of moderate intensity exercise daily 5 times a week (150 minutes total per week)  Plan Continue current medications  Completed Tradjenta patient assistance application  faxed to [employer]   Hyperlipidemia   LDL goal < 70  Lipid Panel     Component Value Date/Time   CHOL 143 03/20/2019 0406   TRIG 108 09/21/2019 2336   HDL 48 03/20/2019 0406   LDLCALC 86 03/20/2019 0406    Hepatic Function Latest Ref Rng & Units  04/07/2020 03/15/2020 02/23/2020  Total Protein 6.5 - 8.1 g/dL 7.3 7.3 7.3  Albumin 3.5 - 5.0 g/dL 3.6 3.7 3.6  AST 15 - 41 U/L _0 ALT 0 - 44 U/L _1 Alk Phosphatase 38 - 126 U/L 85 94 106  Total Bilirubin 0.3 - 1.2 mg/dL 0.8 0.9 0.9  Bilirubin, Direct 0.0 - 0.2 mg/dL - - -     The 10-year ASCVD risk score [name], et al., 2013) is: 29.3%   Values used to calculate the score:     Age: 68 years     Sex: Female     Is Non-Hispanic African American: Yes     Diabetic: Yes     Tobacco smoker: No     Systolic Blood Pressure: 147 mmHg     Is BP treated: Yes     HDL Cholesterol: 48 mg/dL     Total Cholesterol: 143 mg/dL   Patient has failed these meds in past: N/A Patient is currently uncontrolled on the following medications:  . Atorvastatin 98m daily  We discussed:   . Pt denies side effects with atorvastatin . Discussed appropriate goals for LDL (less than 70) and HDL (greater than 50) . Diet and exercise extensively  o Importance of healthy fats (Avocados, fish, walnuts, flaxseed, etc)  Plan Continue current medications   Hypertension   BP goal is:  <130/80  Office blood pressures are  BP Readings from Last 3 Encounters:  03/29/20 (!) 170/58  03/28/20 (!) 154/60  03/23/20 140/60   Patient checks BP at home daily Patient home BP readings are ranging: SBP 145 since increasing hydralazine  Patient has failed these meds in the past: Spironolactone, Edarbyclor, metoprolol Patient is currently uncontrolled on the following medications:  . Amlodipine 128mdaily . Clonidine 0.16m38mwice daily . Doxazosin 16mg816mily . Furosemide 40mg10mly . Hydralazine 100mg 26me times daily . Sildenafil 20mg t52m times  daily  We discussed: . Diet and exercise extensively o Pt has been trying to cut down on the amount of salt she uses - Has been using "No Salt" - Advised pt to try alternative seasonings such as garlic and pepper - Recommend pt look at salt content in sauces . Pt's BP is being managed by Advanced Hypertension clinic . Hydralazine recently increased and blood pressure has gone down . Lasix helps with pt's swelling . Pt states no issues with heart failure recently  Plan Continue current medications   Hypothyroidism   Lab Results  Component Value Date/Time   TSH 5.150 (H) 03/27/2020 03:24 PM   TSH 5.460 (H) 02/03/2020 12:31 PM   FREET4 1.49 04/19/2019 04:39 PM   FREET4 0.93 01/18/2019 01:56 PM    Patient has failed these meds in past: N/A Patient is currently uncontrolled on the following medications:  . Levothyroxine 112mcg d716m  We discussed:    Levothyroxine was increased to 112mcg da36mon 8/16 by PCP  Pt has picked up the 112mcg str24mh but has not started taking yet  She has been taking 2 tablets of levothyroxine 88mcg stre92m (has about 1 week left)  Advised pt that taking 2 of the 88mcg table50maily is too high of a dose  Consulted with PCP regarding plan  Advised pt to alternate taking 1 tablet and 2 tablets of 88mcg daily 7ml she completes and then come in for a lab visit to assess thyroid hormone levels  Pt agrees  Pt states that her energy level  is fine  Discussed the possibility of switching to name brand Synthroid to help with consistency  Plan Continue current medications  Reassess levothyroxine dosing after lab results come back  AFIB   Patient is currently rhythm controlled. HR 69 BPM  Patient has failed these meds in past: N/A Patient is currently controlled on the following medications:   Amiodarone 234m daily  We discussed:    Pt states no issues with Afib recently  Plan Continue current medications   Gout   Patient has  failed these meds in past: N/A Patient is currently controlled on the following medications:   Allopurinol 2048mdaily  Colchicine 0.77m64mwice daily as needed for gout  We discussed:    Pt had a gout flare about 2 weeks ago and took colchicine for 3 days and flare resolved  Pt states allopurinol was increased to 200m49mily by Dr. HawkTrudie Reedan Continue current medications   Glaucoma   Patient has failed these meds in past: N/A Patient is currently controlled on the following medications:   Latanoprost 0.005% 1 drop in both eyes at bedtime  Plan Continue current medications  Health Maintenance   Patient is currently on the following medications:  . Albuterol HFA 2 puffs every 6 hours as needed for wheezing or shortness of breath . Fluticasone nasal spray 50mc45mt 1 spray in each nostril once daily . Centrum Silver Women's multivitamin daily . Afrin 0.05% 1 spray in each nostril twice daily as needed . Latanoprost   We discussed:   . Pt reports using albuterol for COVID cough o She uses 2 puffs in the morning and 2 puffs at night . Pt reports using Afrin as needed for nose bleeds  Plan Continue current medications   Vaccines   Reviewed and discussed patient's vaccination history.    Immunization History  Administered Date(s) Administered  . Influenza,inj,Quad PF,6+ Mos 05/09/2017, 09/15/2018, 04/13/2019  . Influenza-Unspecified 04/05/2016  . PFIZER SARS-COV-2 Vaccination 09/11/2019, 01/10/2020   Plan Review and discuss at follow up  Medication Management   [facility] Uses pill box? Yes Pt endorses % compliance  We discussed:  . Importance of taking each medications daily as directed . Pt keeps a detailed list of all medications with administration times o Reviewed list today and made appropriated updates  Plan Continue current medication management strategy  Follow up: 1 month phone visit  CourtJannette FogormD Clinical Pharmacist [facility] [phone]

## 2020-03-16 NOTE — Telephone Encounter (Signed)
She can continue the dose of insulin she is on and continue to monitor call back next week on Tuesday with her readings.

## 2020-03-17 ENCOUNTER — Telehealth: Payer: Self-pay | Admitting: Oncology

## 2020-03-17 ENCOUNTER — Encounter: Payer: Self-pay | Admitting: Nurse Practitioner

## 2020-03-17 NOTE — Telephone Encounter (Signed)
Scheduled per 09/01 los, patient has been called and notified. 

## 2020-03-20 ENCOUNTER — Other Ambulatory Visit (HOSPITAL_COMMUNITY): Payer: Self-pay | Admitting: Adult Health

## 2020-03-20 NOTE — Progress Notes (Signed)
[facility] PREP Weekly Session   Patient Details  Name: Patricia Mata MRN: [id number] Date of Birth: 1955/11/25 Age: 64 y.o. PCP: [name], FNP  Vitals:   03/20/20 1434  Weight: 207 lb (93.9 kg)     [name] Weekly seesion - 03/20/20 1400      Weekly Session   Topic Discussed Expectations and non-scale victories    Minutes exercised this week --   no charted    Classes attended to date 10          Fun things since last meeting: danced and walked Grateful for the BP being a lot better (140's) Blood sugar being much better Nutrition celebration: I bought dill I halfed my food  May miss Wednesday for MD appt     Barnett Hatter 03/20/2020, 2:35 PM

## 2020-03-22 ENCOUNTER — Ambulatory Visit: Payer: BC Managed Care – PPO

## 2020-03-22 ENCOUNTER — Other Ambulatory Visit (HOSPITAL_COMMUNITY): Payer: Self-pay | Admitting: Cardiology

## 2020-03-22 DIAGNOSIS — E039 Hypothyroidism, unspecified: Secondary | ICD-10-CM

## 2020-03-22 MED ORDER — AMLODIPINE BESYLATE 10 MG PO TABS: 10.0000 mg | ORAL_TABLET | Freq: Every day | ORAL | 3 refills | Status: DC

## 2020-03-23 ENCOUNTER — Other Ambulatory Visit: Payer: Self-pay

## 2020-03-23 ENCOUNTER — Ambulatory Visit (INDEPENDENT_AMBULATORY_CARE_PROVIDER_SITE_OTHER): Payer: BC Managed Care – PPO | Admitting: Emergency Medicine

## 2020-03-23 ENCOUNTER — Encounter: Payer: Self-pay | Admitting: Emergency Medicine

## 2020-03-23 ENCOUNTER — Ambulatory Visit: Payer: BC Managed Care – PPO

## 2020-03-23 DIAGNOSIS — J849 Interstitial pulmonary disease, unspecified: Secondary | ICD-10-CM | POA: Diagnosis not present

## 2020-03-23 DIAGNOSIS — J301 Allergic rhinitis due to pollen: Secondary | ICD-10-CM

## 2020-03-23 DIAGNOSIS — J309 Allergic rhinitis, unspecified: Secondary | ICD-10-CM | POA: Insufficient documentation

## 2020-03-23 DIAGNOSIS — J411 Mucopurulent chronic bronchitis: Secondary | ICD-10-CM | POA: Diagnosis not present

## 2020-03-23 NOTE — Patient Instructions (Signed)
We will obtain sputum cultures and review next time Please continue loratadine 10 mg daily Continue fluticasone nasal spray, 2 sprays each nostril once daily Continue guaifenesin as you have been taking it Follow-up in 6 weeks to review your test results

## 2020-03-23 NOTE — Assessment & Plan Note (Signed)
Please continue loratadine 10 mg daily Continue fluticasone nasal spray, 2 sprays each nostril once daily Continue guaifenesin as you have been taking it

## 2020-03-23 NOTE — Progress Notes (Signed)
Subjective:    Patient ID: Patricia Mata, female    DOB: [birth year], 64 y.o.   MRN: [id number]  HPI 64 year old former smoker (10 pack years) with a history of diabetes, hypertension, severe AS, moderate MS, paroxysmal atrial fibrillation on amiodarone, secondary pulmonary hypertension (on sildenafil and diuretics), chronic renal insufficiency, myelodysplastic syndrome with transfusion dependent anemia, colon cancer with a history of hemicolectomy.  She has been admitted with acute respiratory failure requiring diuretics due to the above in 04/2018. She had COVID-19 pneumonia 03/2019.  She required intubation again in 09/2019 when she was admitted with acute community acquired pneumonia.  Her hypertension has been labile and adjustments in her regimen have been made in the last couple months.  She is referred for persistent cough, sputum production that is tan to brown, and wheeze. Got worse after COVID in 03/2019. Often hears wheeze at night. She is able to exert, cannot walk long distances. Cannot lift heavy objects. She is about to start cardiopulm, rehab. No significant sinus drainage, sometimes PND. She has hoarse voice, sometimes loses her voice. No GERD sx. She received doxy in May from her PCP, continued to have purulent mucous.   She was prescribed ProAir, but did not get it due to cost.   CT chest 09/21/2019 reviewed by me, showed diffuse intralobular septal thickening and patchy airspace disease consistent with acute lung injury/ARDS versus pulmonary edema.  Pulmonary function testing 02/25/2019 reviewed by me, show normal airflows, mildly restricted lung volumes, decreased diffusion capacity that corrects to normal range when just of alveolar volume.  ROV 03/23/20 --follow-up visit for 64 year old woman with a minimal tobacco history, diabetes, hypertension, severe AS with moderate MS, atrial fibrillation on amiodarone, secondary pulmonary hypertension (diuretics, sildenafil), chronic  renal insufficiency, transfusion dependent anemia due to mild dysplastic syndrome, colon cancer.  She had COVID-19 in 03/2019. Last hospitalized for CAP in 09/2019.  She has persistent mucopurulent bronchitic symptoms with cough and purulent sputum.  At her initial visit in July I treated her with Levaquin for possible bronchitis, started loratadine, fluticasone and guaifenesin to assist with secretion clearance. She has continued cough, yellow/brown, can be brought on by laughing, being supine. Occasional mucous from sinuses but mainly from her chest. She is using albuterol 2x a day.   High-resolution CT scan of the chest 03/03/2020 reviewed by me, shows large-scale resolution of her multi lobar infiltrates with some very mild residual fibrotic change persistent at both bases without clear honeycomb change.  Significantly improved from March. Pulmonary function testing done today reviewed by me, show grossly normal airflows without a bronchodilator response, normal lung volumes, decreased diffusion capacity.  MDM:  Reviewed studies above Reviewed oncology notes 03/15/20   Review of Systems As per HPI  Past Medical History:  Diagnosis Date  . Acute kidney injury (nontraumatic) ([place])   . Acute renal failure (ARF) ([place]) 06/08/2018  . Cancer [facility]) 2008   Colon   . Diabetes mellitus without complication ([place])   . Gout 09/08/2018  . Hypertension   . Macrocytic anemia      Family History  Problem Relation Age of Onset  . Hypertension Mother   . Diabetes Mother   . Cervical cancer Mother   . Heart attack Father   . Hypertension Sister   . Hypertension Brother   . Hypertension Sister   . Hypertension Sister   . Prostate cancer Brother   . HIV/AIDS Brother      Social History   Socioeconomic History  .  Marital status: Married    Spouse name: Not on file  . Number of children: Not on file  . Years of education: Not on file  . Highest education level: Not on file  Occupational History    . Not on file  Tobacco Use  . Smoking status: Former Smoker    Packs/day: 0.25    Years: 20.00    Pack years: 5.00    Types: Cigarettes    Quit date: 01/31/2001    Years since quitting: 19.1  . Smokeless tobacco: Former Network engineer  . Vaping Use: Never used  Substance and Sexual Activity  . Alcohol use: Yes    Alcohol/week: 2.0 standard drinks    Types: 2 Shots of liquor per week  . Drug use: Never  . Sexual activity: Not on file  Other Topics Concern  . Not on file  Social History Narrative  . Not on file   Social Determinants of Health   Financial Resource Strain:   . Difficulty of Paying Living Expenses: Not on file  Food Insecurity:   . Worried About Charity fundraiser in the Last Year: Not on file  . Ran Out of Food in the Last Year: Not on file  Transportation Needs:   . Lack of Transportation (Medical): Not on file  . Lack of Transportation (Non-Medical): Not on file  Physical Activity:   . Days of Exercise per Week: Not on file  . Minutes of Exercise per Session: Not on file  Stress:   . Feeling of Stress : Not on file  Social Connections:   . Frequency of Communication with Friends and Family: Not on file  . Frequency of Social Gatherings with Friends and Family: Not on file  . Attends Religious Services: Not on file  . Active Member of Clubs or Organizations: Not on file  . Attends Archivist Meetings: Not on file  . Marital Status: Not on file  Intimate Partner Violence:   . Fear of Current or Ex-Partner: Not on file  . Emotionally Abused: Not on file  . Physically Abused: Not on file  . Sexually Abused: Not on file     Allergies  Allergen Reactions  . Ancef [Cefazolin] Itching    Severe itching- after procedure, ancef was the antibiotic.-04/02/17 Tolerates penicillins  . Lactose Intolerance (Gi) Diarrhea  . Sulfa Antibiotics Rash and Other (See Comments)    Blisters, also     Outpatient Medications Prior to Visit  Medication  Sig Dispense Refill  . albuterol (PROAIR HFA) 108 (90 Base) MCG/ACT inhaler Inhale 2 puffs into the lungs every 6 (six) hours as needed for wheezing or shortness of breath. 18 g 2  . allopurinol (ZYLOPRIM) 100 MG tablet Take 100 mg by mouth daily.    Marland Kitchen amiodarone (PACERONE) 200 MG tablet TAKE 1 TABLET BY MOUTH EVERY DAY 90 tablet 1  . amLODipine (NORVASC) 10 MG tablet Take 1 tablet (10 mg total) by mouth daily. 90 tablet 3  . atorvastatin (LIPITOR) 40 MG tablet Take 1 tablet (40 mg total) by mouth daily. 90 tablet 1  . blood glucose meter kit and supplies Dispense based on patient and insurance preference. Use up to four times daily as directed. (FOR ICD-10 E10.9, E11.9). 1 each 3  . Blood Glucose Monitoring Suppl (ONETOUCH ULTRALINK) w/Device KIT Check blood sugars twice daily E11.9 1 kit 0  . cloNIDine (CATAPRES) 0.1 MG tablet Take 1 tablet (0.1 mg total) by mouth 2 (  two) times daily. 60 tablet 6  . colchicine 0.6 MG tablet TAKE 1 TABLET BY MOUTH TWICE A DAY AS NEEDED FOR GOUT 180 tablet 1  . dextromethorphan-guaiFENesin (MUCINEX DM) 30-600 MG 12hr tablet Take 1 tablet by mouth 2 (two) times daily. 30 tablet 0  . doxazosin (CARDURA) 1 MG tablet Take 2 mg by mouth at bedtime.    . fluticasone (FLONASE) 50 MCG/ACT nasal spray Place 1 spray into both nostrils daily. 16 g 2  . furosemide (LASIX) 40 MG tablet Take 1 tablet by mouth once daily 90 tablet 0  . glucose blood test strip check blood sugars twice daily Dx code E11.22 100 each 3  . hydrALAZINE (APRESOLINE) 100 MG tablet Take 1 tablet (100 mg total) by mouth 3 (three) times daily. 90 tablet 3  . Insulin Glargine (BASAGLAR KWIKPEN) 100 UNIT/ML Inject 0.14 mLs (14 Units total) into the skin daily. 5 pen 1  . Insulin Pen Needle 29G X 5MM MISC Use as directed 200 each 0  . Lancets (ONETOUCH DELICA PLUS [id number]) MISC check blood sugars twice daily Dx code: E11.22 100 each 3  . latanoprost (XALATAN) 0.005 % ophthalmic solution Place 1 drop into  both eyes at bedtime.     Marland Kitchen levothyroxine (SYNTHROID) 112 MCG tablet Take 1 tablet (112 mcg total) by mouth daily. 30 tablet 11  . linagliptin (TRADJENTA) 5 MG TABS tablet Take 1 tablet (5 mg total) by mouth daily. 90 tablet 1  . loratadine (CLARITIN) 10 MG tablet Take 1 tablet (10 mg total) by mouth daily. 30 tablet 11  . Multiple Vitamins-Minerals (CENTRUM SILVER 50+WOMEN) TABS Take 1 tablet by mouth daily.    Marland Kitchen oxymetazoline (AFRIN) 0.05 % nasal spray Place 1 spray into both nostrils 2 (two) times daily as needed for congestion.    . sildenafil (REVATIO) 20 MG tablet TAKE 1 TABLET BY MOUTH THREE TIMES DAILY 90 tablet 0   No facility-administered medications prior to visit.         Objective:   Physical Exam Vitals:   03/23/20 1353  BP: 140/60  Pulse: 75  Temp: 98.6 F (37 C)  TempSrc: Temporal  SpO2: 100%  Weight: 207 lb (93.9 kg)  Height: _0  (1.626 m)   Gen: Pleasant, obese woman, in no distress,  normal affect  ENT: No lesions,  mouth clear,  oropharynx clear, no postnasal drip, strong voice  Neck: No JVD, no stridor  Lungs: No use of accessory muscles, scattered bilateral rhonchi.  She coughs with a deep inspiration, productive cough with tan mucus  Cardiovascular: RRR, blowing holosystolic M, no peripheral edema  Musculoskeletal: No deformities, no cyanosis or clubbing  Neuro: alert, awake, non focal  Skin: Warm, no lesions or rash      Assessment & Plan:  Purulent bronchitis (HCC) Persistent mucopurulent bronchitis with tan secretions.  No clear evidence for acute or chronic sinusitis.  She may benefit some from addition of nasal steroid, loratadine, guaifenesin.  Her CT chest is overall reassuring without any evidence for significant bronchiectasis or interstitial disease following her COVID-19 and bacterial CAP.  Pulmonary function testing also reassuring with grossly normal airflows and normal lung volumes.  We will obtain bacterial, fungal, AFB cultures  from her sputum to look for opportunistic organism.  If no clear etiology identified then will consider bronchoscopy for airway inspection, eosinophils, further cultures.  We will obtain sputum cultures and review next time Please continue loratadine 10 mg daily Continue fluticasone nasal spray, 2 sprays  each nostril once daily Continue guaifenesin as you have been taking it Follow-up in 6 weeks to review your test results  Allergic rhinitis Please continue loratadine 10 mg daily Continue fluticasone nasal spray, 2 sprays each nostril once daily Continue guaifenesin as you have been taking it  Baltazar Apo, MD, PhD 03/23/2020, 2:19 PM [facility] [phone] or if no answer [phone]

## 2020-03-23 NOTE — Assessment & Plan Note (Signed)
Persistent mucopurulent bronchitis with tan secretions.  No clear evidence for acute or chronic sinusitis.  She may benefit some from addition of nasal steroid, loratadine, guaifenesin.  Her CT chest is overall reassuring without any evidence for significant bronchiectasis or interstitial disease following her COVID-19 and bacterial CAP.  Pulmonary function testing also reassuring with grossly normal airflows and normal lung volumes.  We will obtain bacterial, fungal, AFB cultures from her sputum to look for opportunistic organism.  If no clear etiology identified then will consider bronchoscopy for airway inspection, eosinophils, further cultures.  We will obtain sputum cultures and review next time Please continue loratadine 10 mg daily Continue fluticasone nasal spray, 2 sprays each nostril once daily Continue guaifenesin as you have been taking it Follow-up in 6 weeks to review your test results

## 2020-03-23 NOTE — Progress Notes (Signed)
Full PFT completed today ? ?

## 2020-03-23 NOTE — Addendum Note (Signed)
Addended by: Vanessa Barbara on: 03/23/2020 02:46 PM   Modules accepted: Orders

## 2020-03-27 ENCOUNTER — Other Ambulatory Visit: Payer: Self-pay

## 2020-03-27 ENCOUNTER — Other Ambulatory Visit: Payer: Medicare Other

## 2020-03-27 ENCOUNTER — Other Ambulatory Visit: Payer: Self-pay | Admitting: Nurse Practitioner

## 2020-03-27 DIAGNOSIS — E039 Hypothyroidism, unspecified: Secondary | ICD-10-CM

## 2020-03-27 NOTE — Progress Notes (Signed)
[facility] PRE[facility] Weekly Session   Patient Details  Name: Patricia Mata MRN: [id number] Date of Birth: 28-Nov-1955 Age: 64 y.o. PCP: [name], FNP  Vitals:   03/27/20 1421  Weight: 207 lb (93.9 kg)     [name] Weekly seesion - 03/27/20 1400      Weekly Session   Topic Discussed Other   Portion sizes    Minutes exercised this week --   atleast 30 min    Classes attended to date 26          Fun things since last meeting: enjoyed friends and family Grateful for: walking Nutrition celebration: tried to eat better Barriers/struggles: to lose 10 lbs    Barnett Hatter 03/27/2020, 2:23 PM

## 2020-03-28 ENCOUNTER — Ambulatory Visit (INDEPENDENT_AMBULATORY_CARE_PROVIDER_SITE_OTHER): Payer: BC Managed Care – PPO | Admitting: Pharmacist

## 2020-03-28 VITALS — BP 154/60 | HR 75 | Resp 14 | Ht 64.0 in | Wt 208.0 lb

## 2020-03-28 DIAGNOSIS — I10 Essential (primary) hypertension: Secondary | ICD-10-CM | POA: Diagnosis not present

## 2020-03-28 LAB — TSH: TSH: 5.15 u[IU]/mL — ABNORMAL HIGH (ref 0.450–4.500)

## 2020-03-28 LAB — T4: T4, Total: 7.4 ug/dL (ref 4.5–12.0)

## 2020-03-28 LAB — T3, FREE: T3, Free: 1.3 pg/mL — ABNORMAL LOW (ref 2.0–4.4)

## 2020-03-28 MED ORDER — DOXAZOSIN MESYLATE 4 MG PO TABS
4.0000 mg | ORAL_TABLET | Freq: Every day | ORAL | 1 refills | Status: DC
Start: 2020-03-28 — End: 2020-04-27

## 2020-03-28 NOTE — Progress Notes (Signed)
HPI:  Patricia Mata is a 64 y.o. female patient of Dr Oval Linsey, with a [place] below who presents today for advanced hypertension clinic follow up.  She was seen by Dr. Oval Linsey about 6 weeks ago and found to have a blood pressure of 166/68, despite multiple medications.  Chart indicates that she was first diagnosed with hypertension in her 48's, and has not been well controlled.  Recent renal artery dopplers showed a >60% stenosis of the right renal artery.  She has an appointment with Dr. Gwenlyn Found next week to review this information.    Today she reports feeling well overall.  She has been frustrated with the [employer] app, as it has not worked from the beginning. For now I have asked that she just use the cuff and record the readings on a log sheet.  No concerns about her medications, and states compliance with everything. She is happy with the process on the BP control. "Never seen her BP consistently under 180" and now is 130s to 150s.    Past Medical History: PAF CHADS2-VASc score of 5 - no anticoagulation or rate/rhythm control  DM2 A1c 6.2 - On basaglar, tradjenta   Myelodysplastic sydrome W/ transfusion dependent anemia  Pulmonary hypertension On sildenafil 20 mg tid     Blood Pressure Goal:  130/80  Current Medications:  amlodipine 10 mg every morning clonidine 0.1 mg twice daily (am, 5pm) furosemide 40 mg every morning hydralazine 100 mg three times daily (8am, 5pm & 11pm) doxazosin 2 mg hs  Family Hx: mother died at 2 from massive MI, was healthy until then; father probable MI about 22 years ago, was probably in his late 56's; oldest sister with hypertension, another with hypertension; (4 sis, 3 bro); 1 son Armed forces logistics/support/administrative officer) with some valve issues (pt unsure) since childhood  Social Hx: no tobacco, 2 mixed drinks per night; drinks decaf coffee  Diet: mostly home cooked; rarely fries food; plenty of vegetables, fewer fruits  Exercise: loves exercise class with Pam; using exercise  bike at home occaisonally  Home BP readings: pt used W&A cuff and just recorded on paper.   15 readings, average 146/62 with range of 122-164/54-75.  HR range 56-76bpm.    Intolerances: sulfa antibiotics, cefazolin, dairy  Labs: 7/22: Na 136, K 4.4, Glu 179, BUN 46, SCr 2.9, GFR 19  Sleep Apnea (no symptoms)  Renal artery stenosis (renal artery Dopplers shows > 60% stenosis) Hyperaldosteronism (testing not indicated) Hyper/hypothyroidism (mildly hypothyroid 11/2019- meds adjusted) Pheochromocytoma: (testing not indicated) Cushing's syndrome:(testing not indicated) Coarctation of the aorta  Wt Readings from Last 3 Encounters:  03/29/20 207 lb (93.9 kg)  03/28/20 208 lb (94.3 kg)  03/27/20 207 lb (93.9 kg)   BP Readings from Last 3 Encounters:  03/29/20 (!) 170/58  03/28/20 (!) 154/60  03/23/20 140/60   Pulse Readings from Last 3 Encounters:  03/29/20 70  03/28/20 75  03/23/20 75    Current Outpatient Medications  Medication Sig Dispense Refill  . albuterol (PROAIR HFA) 108 (90 Base) MCG/ACT inhaler Inhale 2 puffs into the lungs every 6 (six) hours as needed for wheezing or shortness of breath. 18 g 2  . allopurinol (ZYLOPRIM) 100 MG tablet Take 100 mg by mouth daily.    Marland Kitchen amiodarone (PACERONE) 200 MG tablet TAKE 1 TABLET BY MOUTH EVERY DAY 90 tablet 1  . amLODipine (NORVASC) 10 MG tablet Take 1 tablet (10 mg total) by mouth daily. 90 tablet 3  . atorvastatin (LIPITOR) 40  MG tablet Take 1 tablet (40 mg total) by mouth daily. 90 tablet 1  . blood glucose meter kit and supplies Dispense based on patient and insurance preference. Use up to four times daily as directed. (FOR ICD-10 E10.9, E11.9). 1 each 3  . Blood Glucose Monitoring Suppl (ONETOUCH ULTRALINK) w/Device KIT Check blood sugars twice daily E11.9 1 kit 0  . cloNIDine (CATAPRES) 0.1 MG tablet Take 1 tablet (0.1 mg total) by mouth 2 (two) times daily. 60 tablet 6  . colchicine 0.6 MG tablet TAKE 1 TABLET BY MOUTH TWICE A  DAY AS NEEDED FOR GOUT 180 tablet 1  . dextromethorphan-guaiFENesin (MUCINEX DM) 30-600 MG 12hr tablet Take 1 tablet by mouth 2 (two) times daily. 30 tablet 0  . doxazosin (CARDURA) 4 MG tablet Take 1 tablet (4 mg total) by mouth at bedtime. 30 tablet 1  . fluticasone (FLONASE) 50 MCG/ACT nasal spray Place 1 spray into both nostrils daily. 16 g 2  . furosemide (LASIX) 40 MG tablet Take 1 tablet by mouth once daily 90 tablet 0  . glucose blood test strip check blood sugars twice daily Dx code E11.22 100 each 3  . hydrALAZINE (APRESOLINE) 100 MG tablet Take 1 tablet (100 mg total) by mouth 3 (three) times daily. 90 tablet 3  . Insulin Glargine (BASAGLAR KWIKPEN) 100 UNIT/ML Inject 0.14 mLs (14 Units total) into the skin daily. 5 pen 1  . Insulin Pen Needle 29G X 5MM MISC Use as directed 200 each 0  . Lancets (ONETOUCH DELICA PLUS [id number]) MISC check blood sugars twice daily Dx code: E11.22 100 each 3  . latanoprost (XALATAN) 0.005 % ophthalmic solution Place 1 drop into both eyes at bedtime.     Marland Kitchen levothyroxine (SYNTHROID) 112 MCG tablet Take 1 tablet (112 mcg total) by mouth daily. 30 tablet 11  . linagliptin (TRADJENTA) 5 MG TABS tablet Take 1 tablet (5 mg total) by mouth daily. 90 tablet 1  . loratadine (CLARITIN) 10 MG tablet Take 1 tablet (10 mg total) by mouth daily. 30 tablet 11  . Multiple Vitamins-Minerals (CENTRUM SILVER 50+WOMEN) TABS Take 1 tablet by mouth daily.    Marland Kitchen oxymetazoline (AFRIN) 0.05 % nasal spray Place 1 spray into both nostrils 2 (two) times daily as needed for congestion.    . sildenafil (REVATIO) 20 MG tablet TAKE 1 TABLET BY MOUTH THREE TIMES DAILY 90 tablet 0   No current facility-administered medications for this visit.    Allergies  Allergen Reactions  . Ancef [Cefazolin] Itching    Severe itching- after procedure, ancef was the antibiotic.-04/02/17 Tolerates penicillins  . Lactose Intolerance (Gi) Diarrhea  . Sulfa Antibiotics Rash and Other (See Comments)     Blisters, also    Past Medical History:  Diagnosis Date  . Acute kidney injury (nontraumatic) ([place])   . Acute renal failure (ARF) ([place]) 06/08/2018  . Cancer [facility]) 2008   Colon   . Diabetes mellitus without complication ([place])   . Gout 09/08/2018  . Hypertension   . Macrocytic anemia     Blood pressure (!) 154/60, pulse 75, resp. rate 14, height '5\' 4"'  (1.626 m), weight 208 lb (94.3 kg), SpO2 97 %.  Essential hypertension Blood pressure remains above goal during OV. Home BP average showed better control at 146/62 . But also above desired goal of <130/80. Patient is very happy with current BP control , but also understands need to reach SBP < 140. Noted morning BP consistently higher than evening readings and  baseline Scr is ~ 2.   Will increase doxazosin to 45m at bedtime and follow up in 4 weeks. Plan to change clonidine 0.128mtablets to patch during next OV to simplify therapy and provide consistent dose during the day. Will determine if patch is covered by insurance piror to next visit.    Chistine Dematteo Rodriguez-Guzman PharmD, BCPS, [id number]/2021 10:46 AM

## 2020-03-28 NOTE — Patient Instructions (Addendum)
Return for a  follow up appointment in 4 weeks  Check your blood pressure at home daily (if able) and keep record of the readings.  Take your BP meds as follows: *INCREASE doxazosin 4mg  at bedtime* *Continue all other medication as prescribed*  Bring all of your meds, your BP cuff and your record of home blood pressures to your next appointment.  Exercise as you're able, try to walk approximately 30 minutes per day.  Keep salt intake to a minimum, especially watch canned and prepared boxed foods.  Eat more fresh fruits and vegetables and fewer canned items.  Avoid eating in fast food restaurants.    HOW TO TAKE YOUR BLOOD PRESSURE: . Rest 5 minutes before taking your blood pressure. .  Don't smoke or drink caffeinated beverages for at least 30 minutes before. . Take your blood pressure before (not after) you eat. . Sit comfortably with your back supported and both feet on the floor (don't cross your legs). . Elevate your arm to heart level on a table or a desk. . Use the proper sized cuff. It should fit smoothly and snugly around your bare upper arm. There should be enough room to slip a fingertip under the cuff. The bottom edge of the cuff should be 1 inch above the crease of the elbow. . Ideally, take 3 measurements at one sitting and record the average.

## 2020-03-29 ENCOUNTER — Ambulatory Visit (INDEPENDENT_AMBULATORY_CARE_PROVIDER_SITE_OTHER): Payer: BC Managed Care – PPO | Admitting: Cardiovascular Disease

## 2020-03-29 ENCOUNTER — Other Ambulatory Visit: Payer: Self-pay

## 2020-03-29 ENCOUNTER — Encounter: Payer: Self-pay | Admitting: Cardiovascular Disease

## 2020-03-29 VITALS — BP 170/58 | HR 70 | Ht 64.0 in | Wt 207.0 lb

## 2020-03-29 DIAGNOSIS — I6523 Occlusion and stenosis of bilateral carotid arteries: Secondary | ICD-10-CM

## 2020-03-29 DIAGNOSIS — I10 Essential (primary) hypertension: Secondary | ICD-10-CM

## 2020-03-29 NOTE — Patient Instructions (Signed)
Medication Instructions:  No Changes In Medications at this time.  *If you need a refill on your cardiac medications before your next appointment, please call your pharmacy*  Lab Work: None Ordered At This Time.  If you have labs (blood work) drawn today and your tests are completely normal, you will receive your results only by: MyChart Message (if you have MyChart) OR A paper copy in the mail If you have any lab test that is abnormal or we need to change your treatment, we will call you to review the results.  Testing/Procedures: None Ordered At This Time.   Follow-Up: At [facility], you and your health needs are our priority.  As part of our continuing mission to provide you with exceptional heart care, we have created designated Provider Care Teams.  These Care Teams include your primary Cardiologist (physician) and Advanced Practice Providers (APPs -  Physician Assistants and Nurse Practitioners) who all work together to provide you with the care you need, when you need it.  Your next appointment:   AS NEEDED   The format for your next appointment:   In Person  Provider:   Jonathan Berry, MD          

## 2020-03-29 NOTE — Assessment & Plan Note (Signed)
Patricia Mata was referred back to me by Dr. Oval Linsey for resistant hypertension.  I have been taking care of her for many years.  She is on multiple antihypertensive medications include amlodipine, clonidine, doxazosin and hydralazine.  Her blood pressures at home seem to be well controlled.  She does have moderate renal insufficiency with a serum creatinine that runs in the low to mid 2 range.  She did have renal Doppler studies performed 02/08/2020 that suggested blockages in her celiac axis and SMA although she denies symptoms of mesenteric ischemia.  She has a right renal velocity of 250 cm/s with a renal aortic ratio of 4 on that side and of 2.8 on the left.  Renal dimensions are preserved.  I am not sure that her velocities are suggestive of a hemodynamically significant lesion that would be causing renal vascular hypertension.  At this point, given her moderate renal insufficiency, and the fact that her blood pressures controlled on several medications, I prefer watchful waiting.

## 2020-03-29 NOTE — Progress Notes (Signed)
03/29/2020 Patricia Mata   1956-01-02  [id number]  Primary Physician [name], [place] Primary Cardiologist: Lorretta Harp MD [name], [place]  HPI:  Patricia Mata is a 64 y.o.  married African-American female mother of one child, grandmother of 2 children referred by Dr. Baird Cancer for cardiac evaluation because of lower extremity edema.She is accompanied by her husband [name] and daughter Deloris . I last saw her in the  11/21/2017..I apparently saw her 20 years ago which time I did a heart catheterization on her revealing normal coronary arteries and normal LV function. She was referred for evaluation of bilateral lower extremity edema. She does have a history of hypertension, hyperlipidemia and diabetes. She stopped smoking 20 years ago. She's never had a heart attack or stroke. Her father did have a myocardial infarction in his late 48s and died from that. She had colon cancer 10 years ago and had surgical cure with chemotherapy. She remains cancer free. She's recently had iron deficiency anemia and has undergone multiple transfusions. She's noticed increasing lower extremity edema over the last several weeks to months. She also drinks 2 mixed drinks a day.  Since I saw her she was begun on low-dose furosemide which resulted in improvement in her edema and dyspnea. Outpatient diagnostic studies included carotid Dopplers performed 06/03/17 that showed an occluded right ICA with moderate left ICA stenosis. 2-D echo performed 05/16/17 showed normal LV systolic function, moderate concentric LVH with grade 2 diastolic dysfunction and severe aortic stenosis with valve area 0.7 cm and a peak gradient of 70 mmHg.  I had arranged for her to undergo right and left heart cath to further evaluate her aortic stenosis however her precath labs were remarkable for severe hyperglycemia and anemia. She ultimately was transfused 3 units of packed red blood cells. She does get Epogen  subcutaneously for myelodysplasia. She was also hospitalized and placed on subcutaneous insulin. Since I saw her 2 weeks ago her dyspnea has markedly improved as result of transfusion and diuresis. Her hemoglobin has come up from the 6 range up to the mid 8 range. At this point, given her relative lack of symptoms and her other comorbidities going to defer invasive evaluation of her aortic stenosis we'll continue to monitor her noninvasively.  Since I saw her in the office 2 years ago she has seen Dr. Haroldine Laws for pulmonary hypertension and is recently established with Dr. Oval Linsey in the hypertension clinic.  Her last 2D echo performed 09/21/2019 shows normal LV function with severe aortic stenosis although she seems to be asymptomatic from this.  Carotid Dopplers performed 06/03/2019 shows an occluded right carotid with moderate left ICA stenosis.  She did have renal Doppler studies performed 02/08/2020 that suggested high-frequency velocities in her SMA and celiac axis although she denies symptoms of mesenteric ischemia.  She has only mildly elevated velocities in the proximal right renal artery with a renal aortic ratio of 4 and preserved renal dimensions.  Her blood pressures are well controlled at home on several medications.  She does have moderate renal insufficiency with serum creatinine in the low to mid 2 range.  I am not convinced that she has significant renal artery stenosis contributing to renal vascular hypertension.   Current Meds  Medication Sig  . albuterol (PROAIR HFA) 108 (90 Base) MCG/ACT inhaler Inhale 2 puffs into the lungs every 6 (six) hours as needed for wheezing or shortness of breath.  . allopurinol (ZYLOPRIM) 100 MG tablet Take 100  mg by mouth daily.  Marland Kitchen amiodarone (PACERONE) 200 MG tablet TAKE 1 TABLET BY MOUTH EVERY DAY  . amLODipine (NORVASC) 10 MG tablet Take 1 tablet (10 mg total) by mouth daily.  Marland Kitchen atorvastatin (LIPITOR) 40 MG tablet Take 1 tablet (40 mg total) by mouth  daily.  . blood glucose meter kit and supplies Dispense based on patient and insurance preference. Use up to four times daily as directed. (FOR ICD-10 E10.9, E11.9).  Marland Kitchen Blood Glucose Monitoring Suppl (ONETOUCH ULTRALINK) w/Device KIT Check blood sugars twice daily E11.9  . cloNIDine (CATAPRES) 0.1 MG tablet Take 1 tablet (0.1 mg total) by mouth 2 (two) times daily.  . colchicine 0.6 MG tablet TAKE 1 TABLET BY MOUTH TWICE A DAY AS NEEDED FOR GOUT  . dextromethorphan-guaiFENesin (MUCINEX DM) 30-600 MG 12hr tablet Take 1 tablet by mouth 2 (two) times daily.  Marland Kitchen doxazosin (CARDURA) 4 MG tablet Take 1 tablet (4 mg total) by mouth at bedtime.  . fluticasone (FLONASE) 50 MCG/ACT nasal spray Place 1 spray into both nostrils daily.  . furosemide (LASIX) 40 MG tablet Take 1 tablet by mouth once daily  . glucose blood test strip check blood sugars twice daily Dx code E11.22  . hydrALAZINE (APRESOLINE) 100 MG tablet Take 1 tablet (100 mg total) by mouth 3 (three) times daily.  . Insulin Glargine (BASAGLAR KWIKPEN) 100 UNIT/ML Inject 0.14 mLs (14 Units total) into the skin daily.  . Insulin Pen Needle 29G X 5MM MISC Use as directed  . Lancets (ONETOUCH DELICA PLUS [id number]) MISC check blood sugars twice daily Dx code: E11.22  . latanoprost (XALATAN) 0.005 % ophthalmic solution Place 1 drop into both eyes at bedtime.   Marland Kitchen levothyroxine (SYNTHROID) 112 MCG tablet Take 1 tablet (112 mcg total) by mouth daily.  Marland Kitchen linagliptin (TRADJENTA) 5 MG TABS tablet Take 1 tablet (5 mg total) by mouth daily.  Marland Kitchen loratadine (CLARITIN) 10 MG tablet Take 1 tablet (10 mg total) by mouth daily.  . Multiple Vitamins-Minerals (CENTRUM SILVER 50+WOMEN) TABS Take 1 tablet by mouth daily.  Marland Kitchen oxymetazoline (AFRIN) 0.05 % nasal spray Place 1 spray into both nostrils 2 (two) times daily as needed for congestion.  . sildenafil (REVATIO) 20 MG tablet TAKE 1 TABLET BY MOUTH THREE TIMES DAILY     Allergies  Allergen Reactions  . Ancef  [Cefazolin] Itching    Severe itching- after procedure, ancef was the antibiotic.-04/02/17 Tolerates penicillins  . Lactose Intolerance (Gi) Diarrhea  . Sulfa Antibiotics Rash and Other (See Comments)    Blisters, also    Social History   Socioeconomic History  . Marital status: Married    Spouse name: Not on file  . Number of children: Not on file  . Years of education: Not on file  . Highest education level: Not on file  Occupational History  . Not on file  Tobacco Use  . Smoking status: Former Smoker    Packs/day: 0.25    Years: 20.00    Pack years: 5.00    Types: Cigarettes    Quit date: 01/31/2001    Years since quitting: 19.1  . Smokeless tobacco: Former Network engineer  . Vaping Use: Never used  Substance and Sexual Activity  . Alcohol use: Yes    Alcohol/week: 2.0 standard drinks    Types: 2 Shots of liquor per week  . Drug use: Never  . Sexual activity: Not on file  Other Topics Concern  . Not on file  Social  History Narrative  . Not on file   Social Determinants of Health   Financial Resource Strain:   . Difficulty of Paying Living Expenses: Not on file  Food Insecurity:   . Worried About Charity fundraiser in the Last Year: Not on file  . Ran Out of Food in the Last Year: Not on file  Transportation Needs:   . Lack of Transportation (Medical): Not on file  . Lack of Transportation (Non-Medical): Not on file  Physical Activity:   . Days of Exercise per Week: Not on file  . Minutes of Exercise per Session: Not on file  Stress:   . Feeling of Stress : Not on file  Social Connections:   . Frequency of Communication with Friends and Family: Not on file  . Frequency of Social Gatherings with Friends and Family: Not on file  . Attends Religious Services: Not on file  . Active Member of Clubs or Organizations: Not on file  . Attends Archivist Meetings: Not on file  . Marital Status: Not on file  Intimate Partner Violence:   . Fear of  Current or Ex-Partner: Not on file  . Emotionally Abused: Not on file  . Physically Abused: Not on file  . Sexually Abused: Not on file     Review of Systems: General: negative for chills, fever, night sweats or weight changes.  Cardiovascular: negative for chest pain, dyspnea on exertion, edema, orthopnea, palpitations, paroxysmal nocturnal dyspnea or shortness of breath Dermatological: negative for rash Respiratory: negative for cough or wheezing Urologic: negative for hematuria Abdominal: negative for nausea, vomiting, diarrhea, bright red blood per rectum, melena, or hematemesis Neurologic: negative for visual changes, syncope, or dizziness All other systems reviewed and are otherwise negative except as noted above.    Blood pressure (!) 170/58, pulse 70, height '5\' 4"'  (1.626 m), weight 207 lb (93.9 kg), SpO2 98 %.  General appearance: alert and no distress Neck: no adenopathy, no JVD, supple, symmetrical, trachea midline, thyroid not enlarged, symmetric, no tenderness/mass/nodules and Loud bilateral carotid bruits Lungs: clear to auscultation bilaterally Heart: 26 systolic ejection murmur at the base consistent with aortic stenosis Extremities: extremities normal, atraumatic, no cyanosis or edema Pulses: 2+ and symmetric Skin: Skin color, texture, turgor normal. No rashes or lesions Neurologic: Alert and oriented X 3, normal strength and tone. Normal symmetric reflexes. Normal coordination and gait  EKG sinus rhythm at 70 without ST or T wave changes.  I personally reviewed this EKG.  ASSESSMENT AND PLAN:   Essential hypertension Ms. Camposano was referred back to me by Dr. Oval Linsey for resistant hypertension.  I have been taking care of her for many years.  She is on multiple antihypertensive medications include amlodipine, clonidine, doxazosin and hydralazine.  Her blood pressures at home seem to be well controlled.  She does have moderate renal insufficiency with a serum  creatinine that runs in the low to mid 2 range.  She did have renal Doppler studies performed 02/08/2020 that suggested blockages in her celiac axis and SMA although she denies symptoms of mesenteric ischemia.  She has a right renal velocity of 250 cm/s with a renal aortic ratio of 4 on that side and of 2.8 on the left.  Renal dimensions are preserved.  I am not sure that her velocities are suggestive of a hemodynamically significant lesion that would be causing renal vascular hypertension.  At this point, given her moderate renal insufficiency, and the fact that her blood pressures controlled  on several medications, I prefer watchful waiting.      Lorretta Harp MD FACP,FACC,FAHA, [facility] 03/29/2020 1:49 PM

## 2020-03-31 ENCOUNTER — Encounter: Payer: Self-pay | Admitting: Pharmacist

## 2020-03-31 NOTE — Assessment & Plan Note (Signed)
Blood pressure remains above goal during OV. Home BP average showed better control at 146/62 . But also above desired goal of <130/80. Patient is very happy with current BP control , but also understands need to reach SBP < 140. Noted morning BP consistently higher than evening readings and baseline Scr is ~ 2.   Will increase doxazosin to 4mg  at bedtime and follow up in 4 weeks. Plan to change clonidine 0.1mg  tablets to patch during next OV to simplify therapy and provide consistent dose during the day. Will determine if patch is covered by insurance piror to next visit.

## 2020-04-04 NOTE — Addendum Note (Signed)
Addended by: Mike Craze on: 04/04/2020 02:08 PM   Modules accepted: Orders

## 2020-04-07 ENCOUNTER — Other Ambulatory Visit: Payer: Self-pay | Admitting: Hematology and Oncology

## 2020-04-07 ENCOUNTER — Inpatient Hospital Stay: Payer: Medicare Other

## 2020-04-07 ENCOUNTER — Other Ambulatory Visit: Payer: Self-pay | Admitting: *Deleted

## 2020-04-07 ENCOUNTER — Other Ambulatory Visit: Payer: Self-pay

## 2020-04-07 VITALS — BP 183/46 | HR 60 | Temp 98.4°F | Resp 16

## 2020-04-07 DIAGNOSIS — D649 Anemia, unspecified: Secondary | ICD-10-CM

## 2020-04-07 DIAGNOSIS — D469 Myelodysplastic syndrome, unspecified: Secondary | ICD-10-CM | POA: Diagnosis not present

## 2020-04-07 DIAGNOSIS — Z23 Encounter for immunization: Secondary | ICD-10-CM

## 2020-04-07 LAB — SAMPLE TO BLOOD BANK

## 2020-04-07 LAB — CBC WITH DIFFERENTIAL (CANCER CENTER ONLY)
Abs Immature Granulocytes: 0.04 10*3/uL (ref 0.00–0.07)
Basophils Absolute: 0 10*3/uL (ref 0.0–0.1)
Basophils Relative: 0 %
Eosinophils Absolute: 0.1 10*3/uL (ref 0.0–0.5)
Eosinophils Relative: 1 %
HCT: 22 % — ABNORMAL LOW (ref 36.0–46.0)
Hemoglobin: 7.5 g/dL — ABNORMAL LOW (ref 12.0–15.0)
Immature Granulocytes: 1 %
Lymphocytes Relative: 9 %
Lymphs Abs: 0.5 10*3/uL — ABNORMAL LOW (ref 0.7–4.0)
MCH: 30.9 pg (ref 26.0–34.0)
MCHC: 34.1 g/dL (ref 30.0–36.0)
MCV: 90.5 fL (ref 80.0–100.0)
Monocytes Absolute: 0.9 10*3/uL (ref 0.1–1.0)
Monocytes Relative: 17 %
Neutro Abs: 3.5 10*3/uL (ref 1.7–7.7)
Neutrophils Relative %: 72 %
Platelet Count: 200 10*3/uL (ref 150–400)
RBC: 2.43 MIL/uL — ABNORMAL LOW (ref 3.87–5.11)
RDW: 21.6 % — ABNORMAL HIGH (ref 11.5–15.5)
WBC Count: 4.9 10*3/uL (ref 4.0–10.5)
nRBC: 0.8 % — ABNORMAL HIGH (ref 0.0–0.2)

## 2020-04-07 LAB — CMP (CANCER CENTER ONLY)
ALT: 19 U/L (ref 0–44)
AST: 28 U/L (ref 15–41)
Albumin: 3.6 g/dL (ref 3.5–5.0)
Alkaline Phosphatase: 85 U/L (ref 38–126)
Anion gap: 6 (ref 5–15)
BUN: 75 mg/dL — ABNORMAL HIGH (ref 8–23)
CO2: 25 mmol/L (ref 22–32)
Calcium: 9.2 mg/dL (ref 8.9–10.3)
Chloride: 105 mmol/L (ref 98–111)
Creatinine: 2.78 mg/dL — ABNORMAL HIGH (ref 0.44–1.00)
GFR, Est AFR Am: 20 mL/min — ABNORMAL LOW (ref >60)
GFR, Estimated: 17 mL/min — ABNORMAL LOW (ref >60)
Glucose, Bld: 99 mg/dL (ref 70–99)
Potassium: 4.5 mmol/L (ref 3.5–5.1)
Sodium: 136 mmol/L (ref 135–145)
Total Bilirubin: 0.8 mg/dL (ref 0.3–1.2)
Total Protein: 7.3 g/dL (ref 6.5–8.1)

## 2020-04-07 LAB — PREPARE RBC (CROSSMATCH)

## 2020-04-07 MED ORDER — FUROSEMIDE 10 MG/ML IJ SOLN
INTRAMUSCULAR | Status: AC
  Filled 2020-04-07: qty 2

## 2020-04-07 MED ORDER — ACETAMINOPHEN 325 MG PO TABS
650.0000 mg | ORAL_TABLET | Freq: Once | ORAL | Status: AC
  Administered 2020-04-07: 650 mg via ORAL

## 2020-04-07 MED ORDER — LUSPATERCEPT-AAMT 75 MG ~~LOC~~ SOLR
1.3300 mg/kg | Freq: Once | SUBCUTANEOUS | Status: AC
  Administered 2020-04-07: 115 mg via SUBCUTANEOUS
  Filled 2020-04-07: qty 1

## 2020-04-07 MED ORDER — FUROSEMIDE 10 MG/ML IJ SOLN
20.0000 mg | Freq: Once | INTRAMUSCULAR | Status: AC
  Administered 2020-04-07: 20 mg via INTRAVENOUS

## 2020-04-07 MED ORDER — SODIUM CHLORIDE 0.9% IV SOLUTION
250.0000 mL | Freq: Once | INTRAVENOUS | Status: AC
  Administered 2020-04-07: 250 mL via INTRAVENOUS
  Filled 2020-04-07: qty 250

## 2020-04-07 MED ORDER — DIPHENHYDRAMINE HCL 25 MG PO CAPS
25.0000 mg | ORAL_CAPSULE | Freq: Once | ORAL | Status: AC
  Administered 2020-04-07: 25 mg via ORAL

## 2020-04-07 MED ORDER — DIPHENHYDRAMINE HCL 25 MG PO CAPS
ORAL_CAPSULE | ORAL | Status: AC
  Filled 2020-04-07: qty 1

## 2020-04-07 MED ORDER — ACETAMINOPHEN 325 MG PO TABS
ORAL_TABLET | ORAL | Status: AC
  Filled 2020-04-07: qty 2

## 2020-04-07 NOTE — Progress Notes (Signed)
Per Sandi Mealy, PA-C, okay to send patient home with elevated BP since she is not symptomatic. Patient educated to take BP medication and recheck pressure at home.

## 2020-04-07 NOTE — Patient Instructions (Signed)

## 2020-04-07 NOTE — Progress Notes (Signed)
Per Dr. Lorenso Courier OK to treat with today's labs

## 2020-04-07 NOTE — Patient Instructions (Addendum)
Visit Information  Goals Addressed            This Visit's Progress   . Pharmacy Care Plan       CARE PLAN ENTRY (see longitudinal plan of care for additional care plan information)  Current Barriers:  . Chronic Disease Management support, education, and care coordination needs related to Hypertension, Hyperlipidemia, Diabetes, and Hypothyroidism   Hypertension BP Readings from Last 3 Encounters:  03/29/20 (!) 170/58  03/28/20 (!) 154/60  03/23/20 140/60   . Pharmacist Clinical Goal(s): o Over the next 90 days, patient will work with PharmD and providers to achieve BP goal <130/80 . Current regimen:  o Amlodipine 10mg  daily o Clonidine 0.1mg  twice daily o Doxazosin 1mg  daily o Furosemide 40mg  daily o Hydralazine 100mg  three times daily o Sildenafil 20mg  three times daily . Interventions: o Provided dietary and exercise recommendations o Reviewed medications changes by [facility]; discussed with patient . Patient self care activities - Over the next 90 days, patient will: o Check BP daily, document, and provide at future appointments o Ensure daily salt intake < 2300 mg/day o Increase exercise with goal of at least 150 minutes weekly (more if tolerated) o Use alternative seasoning to salt (Garlic, pepper, etc)  Hyperlipidemia Lab Results  Component Value Date/Time   LDLCALC 86 03/20/2019 04:06 AM   . Pharmacist Clinical Goal(s): o Over the next 90 days, patient will work with PharmD and providers to achieve LDL goal < 70 . Current regimen:  o Atorvastatin 40mg  daily . Interventions: o Provided dietary and exercise recommendations o Discussed appropriate goals for LDL (less than 70) and HDL (greater than 50) . Patient self care activities - Over the next 90 days, patient will: o Increase exercise with goal of at least 150 minutes weekly (more if tolerated) o Increase intake of healthy fats in diet (avocados, walnuts, fish, flaxseed,  etc)  Diabetes Lab Results  Component Value Date/Time   HGBA1C 6.2 (H) 02/03/2020 12:31 PM   HGBA1C 6.1 (H) 12/07/2019 12:35 PM   . Pharmacist Clinical Goal(s): o Over the next 180 days, patient will work with PharmD and providers to maintain A1c goal <7% . Current regimen:  o Basaglar 16 units daily o Tradjenta 5mg  daily . Interventions: o Provided dietary and exercise recommendations o Discussed appropriate goal for fasting blood sugar (80-130) o Hypoglycemia (blood sugar less than 70) and treatment o Formulary review of [employer] HMO and [place] covered under [place] (90 day supply for $0 copay), not covered under [facility] . Contacted [facility] and advised them to English as a second language teacher under [place] and confirmed $0 copay - Tradjenta not covered under [employer] or [place] . Start patient assistance application . Gave 1 week of samples (patient has 3 weeks total on hand) o Tradjenta patient assistance application through [employer] - Patient provided proof of income documents and signed application - PCP signed healthcare provider portion of application - Completed application faxed to [facility] . Patient self care activities - Over the next 180 days, patient will: o Check blood sugar once daily, document, and provide at future appointments o Contact provider with any episodes of hypoglycemia o Increase exercise with goal of at least 150 minutes weekly (more if tolerated) o Increase water intake to 64 ounces daily o Utilize PLATE method for meal planning (provided education)  Hypothyroidism . Pharmacist Clinical Goal(s) o Over the next  90 days, patient will work with PharmD and providers to achieve thyroid hormone levels within normal limits . Current regimen:  o Levothyroxine 14mcg . Interventions: o Patient was advised to increase to levothyroxine 115mcg tablets daily on 8/16 but has been taking 2  tablets of 96mcg until she completes her current supply of 77mcg (about 1 week left) o Collaborated with PCP regarding patient dosing of levothyroxine o Advised patient to alternate 1 tablets of 69mcg and 2 tablets of 5mcg every other day and then come in for lab work to evaluate thyroid levels o Discussed possibility of switching to name brand Synthroid to decrease variability . Patient self care activities - Over the next 90 days, patient will: o Alternate 1 tablet of 22mcg and 2 tablets of 18mcg levothyroxine every other day until finished and then come into the office fir thyroid hormone labwork  Medication management . Pharmacist Clinical Goal(s): o Over the next 180 days, patient will work with PharmD and providers to maintain optimal medication adherence . Current pharmacy: [facility] . Interventions o Comprehensive medication review performed. o Continue current medication management strategy . Patient self care activities - Over the next 180 days, patient will: o Focus on medication adherence by continued use of pill box o Take medications as prescribed o Report any questions or concerns to PharmD and/or provider(s)  Initial goal documentation        Patricia Mata was given information about Chronic Care Management services today including:  1. CCM service includes personalized support from designated clinical staff supervised by her physician, including individualized plan of care and coordination with other care providers 2. 24/7 contact phone numbers for assistance for urgent and routine care needs. 3. Standard insurance, coinsurance, copays and deductibles apply for chronic care management only during months in which we provide at least 20 minutes of these services. Most insurances cover these services at 100%, however patients may be responsible for any copay, coinsurance and/or deductible if applicable. This service may help you avoid the need for more expensive face-to-face  services. 4. Only one practitioner may furnish and bill the service in a calendar month. 5. The patient may stop CCM services at any time (effective at the end of the month) by phone call to the office staff.  Patient agreed to services and verbal consent obtained.   The patient verbalized understanding of instructions provided today and agreed to receive a mailed copy of patient instruction and/or educational materials. Telephone follow up appointment with pharmacy team member scheduled for: 04/14/20 @ 1: 20 PM  Jannette Fogo, PharmD Clinical Pharmacist [facility] [phone]   Diabetes Mellitus and Nutrition, Adult When you have diabetes (diabetes mellitus), it is very important to have healthy eating habits because your blood sugar (glucose) levels are greatly affected by what you eat and drink. Eating healthy foods in the appropriate amounts, at about the same times every day, can help you:  Control your blood glucose.  Lower your risk of heart disease.  Improve your blood pressure.  Reach or maintain a healthy weight. Every person with diabetes is different, and each person has different needs for a meal plan. Your health care provider may recommend that you work with a diet and nutrition specialist (dietitian) to make a meal plan that is best for you. Your meal plan may vary depending on factors such as:  The calories you need.  The medicines you take.  Your weight.  Your blood glucose, blood pressure, and cholesterol levels.  Your activity level.  Other health conditions you have, such as heart or kidney disease. How do carbohydrates affect me? Carbohydrates, also called carbs, affect your blood glucose level more than any other type of food. Eating carbs naturally raises the amount of glucose in your blood. Carb counting is a method for keeping track of how many carbs you eat. Counting carbs is important to keep your blood glucose at a  healthy level, especially if you use insulin or take certain oral diabetes medicines. It is important to know how many carbs you can safely have in each meal. This is different for every person. Your dietitian can help you calculate how many carbs you should have at each meal and for each snack. Foods that contain carbs include:  Bread, cereal, rice, pasta, and crackers.  Potatoes and corn.  Peas, beans, and lentils.  Milk and yogurt.  Fruit and juice.  Desserts, such as cakes, cookies, ice cream, and candy. How does alcohol affect me? Alcohol can cause a sudden decrease in blood glucose (hypoglycemia), especially if you use insulin or take certain oral diabetes medicines. Hypoglycemia can be a life-threatening condition. Symptoms of hypoglycemia (sleepiness, dizziness, and confusion) are similar to symptoms of having too much alcohol. If your health care provider says that alcohol is safe for you, follow these guidelines:  Limit alcohol intake to no more than 1 drink per day for nonpregnant women and 2 drinks per day for men. One drink equals 12 oz of beer, 5 oz of wine, or 1 oz of hard liquor.  Do not drink on an empty stomach.  Keep yourself hydrated with water, diet soda, or unsweetened iced tea.  Keep in mind that regular soda, juice, and other mixers may contain a lot of sugar and must be counted as carbs. What are tips for following this plan?  Reading food labels  Start by checking the serving size on the "Nutrition Facts" label of packaged foods and drinks. The amount of calories, carbs, fats, and other nutrients listed on the label is based on one serving of the item. Many items contain more than one serving per package.  Check the total grams (g) of carbs in one serving. You can calculate the number of servings of carbs in one serving by dividing the total carbs by 15. For example, if a food has 30 g of total carbs, it would be equal to 2 servings of carbs.  Check the  number of grams (g) of saturated and trans fats in one serving. Choose foods that have low or no amount of these fats.  Check the number of milligrams (mg) of salt (sodium) in one serving. Most people should limit total sodium intake to less than 2,300 mg per day.  Always check the nutrition information of foods labeled as "low-fat" or "nonfat". These foods may be higher in added sugar or refined carbs and should be avoided.  Talk to your dietitian to identify your daily goals for nutrients listed on the label. Shopping  Avoid buying canned, premade, or processed foods. These foods tend to be high in fat, sodium, and added sugar.  Shop around the outside edge of the grocery store. This includes fresh fruits and vegetables, bulk grains, fresh meats, and fresh dairy. Cooking  Use low-heat cooking methods, such as baking, instead of high-heat cooking methods like deep frying.  Cook using healthy oils, such as olive, canola, or sunflower oil.  Avoid cooking with butter, cream, or high-fat meats. Meal planning  Eat meals and snacks regularly, preferably at the same times every day. Avoid going long periods of time without eating.  Eat foods high in fiber, such as fresh fruits, vegetables, beans, and whole grains. Talk to your dietitian about how many servings of carbs you can eat at each meal.  Eat 4-6 ounces (oz) of lean protein each day, such as lean meat, chicken, fish, eggs, or tofu. One oz of lean protein is equal to: ? 1 oz of meat, chicken, or fish. ? 1 egg. ?  cup of tofu.  Eat some foods each day that contain healthy fats, such as avocado, nuts, seeds, and fish. Lifestyle  Check your blood glucose regularly.  Exercise regularly as told by your health care provider. This may include: ? 150 minutes of moderate-intensity or vigorous-intensity exercise each week. This could be brisk walking, biking, or water aerobics. ? Stretching and doing strength exercises, such as yoga or  weightlifting, at least 2 times a week.  Take medicines as told by your health care provider.  Do not use any products that contain nicotine or tobacco, such as cigarettes and e-cigarettes. If you need help quitting, ask your health care provider.  Work with a Social worker or diabetes educator to identify strategies to manage stress and any emotional and social challenges. Questions to ask a health care provider  Do I need to meet with a diabetes educator?  Do I need to meet with a dietitian?  What number can I call if I have questions?  When are the best times to check my blood glucose? Where to find more information:  American Diabetes Association: diabetes.org  Academy of Nutrition and Dietetics: www.eatright.[employer] of Diabetes and Digestive and Kidney Diseases (NIH): [employer] Summary  A healthy meal plan will help you control your blood glucose and maintain a healthy lifestyle.  Working with a diet and nutrition specialist (dietitian) can help you make a meal plan that is best for you.  Keep in mind that carbohydrates (carbs) and alcohol have immediate effects on your blood glucose levels. It is important to count carbs and to use alcohol carefully. This information is not intended to replace advice given to you by your health care provider. Make sure you discuss any questions you have with your health care provider. Document Revised: 06/13/2017 Document Reviewed: 08/05/2016 Elsevier Patient Education  2020 [employer].

## 2020-04-07 NOTE — Progress Notes (Signed)
   Covid-19 Vaccination Clinic  Name:  Patricia Mata    MRN: [id number] DOB: 12-31-1955  04/07/2020  [name] was observed post Covid-19 immunization for 15 minutes  without incident. She was provided with Vaccine Information Sheet and instruction to access the V-Safe system.   [name] was instructed to call 911 with any severe reactions post vaccine: Marland Kitchen Difficulty breathing  . Swelling of face and throat  . A fast heartbeat  . A bad rash all over body  . Dizziness and weakness

## 2020-04-08 LAB — PULMONARY FUNCTION TEST
DL/VA % pred: 85 %
DL/VA: 3.56 ml/min/mmHg/L
DLCO cor % pred: 67 %
DLCO cor: 13.42 ml/min/mmHg
DLCO unc % pred: 52 %
DLCO unc: 10.5 ml/min/mmHg
FEF 25-75 Post: 2.78 L/sec
FEF 25-75 Pre: 2.63 L/sec
FEF2575-%Change-Post: 5 %
FEF2575-%Pred-Post: 147 %
FEF2575-%Pred-Pre: 139 %
FEV1-%Change-Post: 0 %
FEV1-%Pred-Post: 99 %
FEV1-%Pred-Pre: 100 %
FEV1-Post: 1.96 L
FEV1-Pre: 1.97 L
FEV1FVC-%Change-Post: 1 %
FEV1FVC-%Pred-Pre: 111 %
FEV6-%Change-Post: -5 %
FEV6-%Pred-Post: 87 %
FEV6-%Pred-Pre: 92 %
FEV6-Post: 2.13 L
FEV6-Pre: 2.25 L
FEV6FVC-%Pred-Post: 103 %
FEV6FVC-%Pred-Pre: 103 %
FVC-%Change-Post: -2 %
FVC-%Pred-Post: 87 %
FVC-%Pred-Pre: 89 %
FVC-Post: 2.19 L
FVC-Pre: 2.25 L
Post FEV1/FVC ratio: 89 %
Post FEV6/FVC ratio: 100 %
Pre FEV1/FVC ratio: 88 %
Pre FEV6/FVC Ratio: 100 %
RV % pred: 83 %
RV: 1.74 L
TLC % pred: 81 %
TLC: 4.12 L

## 2020-04-08 LAB — TYPE AND SCREEN
ABO/RH(D): A NEG
Antibody Screen: NEGATIVE
Unit division: 0
Unit division: 0

## 2020-04-08 LAB — BPAM RBC
Blood Product Expiration Date: 202110152359
Blood Product Expiration Date: 202110152359
ISSUE DATE / TIME: 202109241339
ISSUE DATE / TIME: 202109241339
Unit Type and Rh: 600
Unit Type and Rh: 600

## 2020-04-10 ENCOUNTER — Other Ambulatory Visit (HOSPITAL_COMMUNITY): Payer: Self-pay | Admitting: *Deleted

## 2020-04-10 MED ORDER — AMIODARONE HCL 200 MG PO TABS
200.0000 mg | ORAL_TABLET | Freq: Every day | ORAL | 1 refills | Status: DC
Start: 2020-04-10 — End: 2020-07-19

## 2020-04-14 ENCOUNTER — Other Ambulatory Visit: Payer: Self-pay

## 2020-04-14 ENCOUNTER — Other Ambulatory Visit: Payer: BC Managed Care – PPO

## 2020-04-14 ENCOUNTER — Ambulatory Visit: Payer: Self-pay

## 2020-04-14 ENCOUNTER — Ambulatory Visit: Payer: BC Managed Care – PPO | Admitting: Cardiovascular Disease

## 2020-04-14 DIAGNOSIS — J411 Mucopurulent chronic bronchitis: Secondary | ICD-10-CM

## 2020-04-14 DIAGNOSIS — E1122 Type 2 diabetes mellitus with diabetic chronic kidney disease: Secondary | ICD-10-CM

## 2020-04-14 DIAGNOSIS — E039 Hypothyroidism, unspecified: Secondary | ICD-10-CM

## 2020-04-14 DIAGNOSIS — Z794 Long term (current) use of insulin: Secondary | ICD-10-CM

## 2020-04-14 DIAGNOSIS — I10 Essential (primary) hypertension: Secondary | ICD-10-CM

## 2020-04-14 DIAGNOSIS — N183 Chronic kidney disease, stage 3 unspecified: Secondary | ICD-10-CM

## 2020-04-14 NOTE — Chronic Care Management (AMB) (Signed)
Chronic Care Management Pharmacy  Name: Patricia Mata  MRN: [id number] DOB: 02-Oct-1955  Chief Complaint/ HPI  Patricia Mata,  64 y.o. , female presents for their Follow-Up CCM visit with the clinical pharmacist via telephone due to COVID-19 Pandemic.  PCP : [name], FNP  Their chronic conditions include: Hypertension, Hyperlipidemia, Diabetes, Atrial Fibrillation, Heart Failure, Chronic Kidney Disease and Hypothyroidism  Office Visits: 03/27/20: TSH 5.15 (high).   03/09/20 OV: Referred to CCM pharmacist for assistance with [name] and Basaglar cost. Increase Basaglar to 16 units today, Friday and Saturday, then on Sunday if BG is above 150 increase to 18 units daily Sunday, Monday, and Tuesday.   02/03/20 OV: HgbA1c stable. Kidney function stable. Liver function normal. Thyroid levels are the same. Pt has been taking levothyroxine 24mg 1 tablet Monday through Friday and 2 tablets Saturday and Sunday. Pt advised to increase to 1114m once daily (Rx sent).   12/14/19 Telephone call: Take Basaglar 10 units daily and follow up in 8 weeks. Send BG to office every Mond and ThPurvisTake levothyroxine on Sat and Sun, then recheck her thyroid studies. Stop using the Afrin that can cause more nosebleeds. Send a steroid inhaler to take 2 puffs 2 times a day to see if it helps with her cough.   12/07/19 OV: Thyroid levels are slightly underactive. Advised pt to take an extra tablet of levothyroxine on Sundays for 4 weeks and then return for lab recheck. BG have been elevated, she is not taking Tradjenta. Advised pt to start TrEarlvillesamples given). Productive cough with green sputum. Send for chest Xray and treat with doxycycline 10047mID for 7 days. HgbA1c has decreased to 6.1% from 6.5%.   10/05/19 TV: Hospital follow up. Referral to Home health. Switched to TraJopline to worsening kidney function. Referred to Nephrology. Was advised to hold HCTZ in hospital until follow up with  Cardiology.   09/20/19 TV: Hospital follow up. Pt going back to ER for evaluation due to acute respiratory distress (had difficulty holding conversation during visit). BG elevated for last few days, will most likely need to administer 10 units Lantus when she gets home from ED due to stress of illness.   Consult Visits: 03/29/20 Cardiology OV w/ Dr. 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 Continue to monitor HTN.   03/28/20 Advanced HTN clinic OV w/ Dr. RodRoxanne MinsPP: BP remains above goal, but improved. Average home BP 146/62. Increase doxazosin to 4mg61m bedtime. Plan to change from clonidine tablets to clonidine patch during next OV to simplify therapy  03/23/20 Pulmonology OV w/ Dr. ByruLamonte Sakairsistent mucopurulent bronchitis with tan secretions. PFTs completed today. Reassuring with grossly normal airflows and normal lung volumes. Obtain bacterial, fungal, AFB cultures from sputum to look for opportunistic organism. If no clear etiology identified then will consider bronchoscopy for airway inspection, eosinophils, further cultures. Continue loratadine 10mg34mly. Continue Flonase 2 sprays in each nostril once daily. Continue guaifenesin. Follow up in 6 weeks.    03/15/20 Oncology/Hematology OV w/ Dr. 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 Current IPSS-R score of 2.5. Currently receiving Reblozyl 75mg 76mvery 3 weeks started in December. Hgb 8.4 today, will receive transfusion of 1 unit packed red blood cells.   03/13/20 Ophthalmology OV w/ Dr. ShapirGershon Craneinue latanoprost in each eye nightly. F/U in 3 months.   02/22/20 Cardiology OV w/ Dr. AlvstaBrion Aliment  Advanced HTN clinic follow up. Hydralazine increased to 126m three times daily and continue other medications. Pt will clarify if she is taking 184mor 63m75moxazosin.  02/02/20 Oncology OV w/ Dr. ShaAlen Blewontinue current therapy  and supportive transfusions as needed. Pt not really a candidate for any aggressive measures including stem cell transplant.   01/28/20 Pulmonology OV w/ Dr. ByrLamonte Sakaiongstanding sputum for past few months. Problem has been present since COVID19 diagnosis in 03/2019. Was treated with doxycycline in May without response. Will treat with Levaquin to see if better response with additional gram-negative coverage. Plan to repeat chest CT to compare with March. Repeat PFTs. Start loratadine 39m8mily and guaifenesin 600mg68mce daily to help with mucus burden until next visit.   01/28/20 Telephone call: [facility]   01/14/20 Telephone call: contacted pt regarding referral for PREP from HTN clinic. Will call back once dates are confirmed.   01/10/20 Cardiology OV w/ Dr. RandoOval Linsey clinic): Presented to establish care with HTN clinic. Anticipate restarting diuresis tomorrow as renal function allows following contrast administration today. Consider advanced heart failure consultation to assist with management of HFpEF and severe pulmonary HTN. BP elevated initially and on repeat. Improved since starting clonidine but still above goal. Restart hydralazine 50mg 63me times daily. Given HTN clinic booklet to record BP. Check renal artery dopplers for renal artery stenosis. Consents to remote monitoring through [facility] ACE inhibitors and [facility] due to CKD. Interested in prep program with [facility]. Continue other medications. Follow up in 1 month.   12/30/19 Cardiology telephone call: Pt called complaining os swollen ankles and fingers. States her weight is up 5 lbs. Asked what changes she needs to make.   12/09/19 Ophthalmology OV w/ Dr. ShapirGershon Craneinue Latanoprost 0.005% for primary open angle glaucoma. IOP under good control. F/U in 3 months.   11/30/19 Cardiology OV w/ Dr. BensimHaroldine Lawsine f/u. Pt feeling okay. BP has been elevated. SBP 170  at nephrologist OV earlier this week. Doxazosin was increased from 1mg to98mg at 46mtime. Volume status okay. Continue Lasix. Refer to Advanced HTN clinic for follow up of markedly elevated BP. Start clonidine 1mg BID.68mntinue amlodipine 39mg dail59md doxazosin 63mg daily.17m5/17/21 Cardiology telephone call: PA for sildenafil approved.  11/10/19 Oncology OV w/ Dr. Shadad: PreAlen Blew for repeat evaluation. Continue Reblozyl. Hgb adequate. No transfusion required today. BP elevated.   11/09/19 Mammogram  10/20/19 Infusion due to HgbA1c 7.9  10/12/19 Cardiology OV w/ B. Simmons (Advanced Heart Failure clinic): Pt improved today since hospital discharge. Continue furosemide 40mg daily.58mA II-III, euvolemic and stable. BP mildly elevated. Hydralazine was stopped at recent hospitalization due to low BP. Will not resume hydralazine at this time. F/U in 8 weeks.  09/20/19 ED admission: Presented with worsening shortness of breath not better since discharge. BG has been elevated since discharge. Chest Xray showing signs of worsening pneumonia. Treated with IV cefepime and diuresed with IV Lasix. Follow up with heart failure clinic on 3/30 (check BMET), check CBC in 1 week, [facility] for MDS.  09/14/19 ED admission: Community acquired pneumonia with bilateral infiltrates, increased WBC, and purulent sputum. Given Rocephin and doxycycline during hospitalization. COVID negative. Discharged on 4 days of doxycyline and Cefdinir. Follow up with PCP in 1 week. Repeat chest Xray in 3-4 weeks after completion of antibiotics.   CCM Encounters:  Medications: Outpatient  Encounter Medications as of 04/14/2020  Medication Sig  . albuterol (PROAIR HFA) 108 (90 Base) MCG/ACT inhaler Inhale 2 puffs into the lungs every 6 (six) hours as needed for wheezing or shortness of breath.  . allopurinol (ZYLOPRIM) 100 MG tablet Take 200 mg by mouth daily.   Marland Kitchen amiodarone (PACERONE)  200 MG tablet Take 1 tablet (200 mg total) by mouth daily.  Marland Kitchen amLODipine (NORVASC) 10 MG tablet Take 1 tablet (10 mg total) by mouth daily.  Marland Kitchen atorvastatin (LIPITOR) 40 MG tablet Take 1 tablet (40 mg total) by mouth daily.  . blood glucose meter kit and supplies Dispense based on patient and insurance preference. Use up to four times daily as directed. (FOR ICD-10 E10.9, E11.9).  Marland Kitchen Blood Glucose Monitoring Suppl (ONETOUCH ULTRALINK) w/Device KIT Check blood sugars twice daily E11.9  . cloNIDine (CATAPRES) 0.1 MG tablet Take 1 tablet (0.1 mg total) by mouth 2 (two) times daily.  . colchicine 0.6 MG tablet TAKE 1 TABLET BY MOUTH TWICE A DAY AS NEEDED FOR GOUT  . dextromethorphan-guaiFENesin (MUCINEX DM) 30-600 MG 12hr tablet Take 1 tablet by mouth 2 (two) times daily.  Marland Kitchen doxazosin (CARDURA) 4 MG tablet Take 1 tablet (4 mg total) by mouth at bedtime.  . fluticasone (FLONASE) 50 MCG/ACT nasal spray Place 1 spray into both nostrils daily.  . furosemide (LASIX) 40 MG tablet Take 1 tablet by mouth once daily  . glucose blood test strip check blood sugars twice daily Dx code E11.22  . hydrALAZINE (APRESOLINE) 100 MG tablet Take 1 tablet (100 mg total) by mouth 3 (three) times daily.  . Insulin Glargine (BASAGLAR KWIKPEN) 100 UNIT/ML Inject 0.14 mLs (14 Units total) into the skin daily. (Patient taking differently: Inject 16 Units into the skin daily. )  . Insulin Pen Needle 29G X 5MM MISC Use as directed  . Lancets (ONETOUCH DELICA PLUS MCNOBS96G) MISC check blood sugars twice daily Dx code: E11.22  . latanoprost (XALATAN) 0.005 % ophthalmic solution Place 1 drop into both eyes at bedtime.   Marland Kitchen levothyroxine (SYNTHROID) 112 MCG tablet Take 1 tablet (112 mcg total) by mouth daily.  Marland Kitchen linagliptin (TRADJENTA) 5 MG TABS tablet Take 1 tablet (5 mg total) by mouth daily.  Marland Kitchen loratadine (CLARITIN) 10 MG tablet Take 1 tablet (10 mg total) by mouth daily. (Patient not taking: Reported on 04/07/2020)  . Multiple  Vitamins-Minerals (CENTRUM SILVER 50+WOMEN) TABS Take 1 tablet by mouth daily.  Marland Kitchen oxymetazoline (AFRIN) 0.05 % nasal spray Place 1 spray into both nostrils 2 (two) times daily as needed for congestion.  . sildenafil (REVATIO) 20 MG tablet TAKE 1 TABLET BY MOUTH THREE TIMES DAILY   No facility-administered encounter medications on file as of 04/14/2020.    Current Diagnosis/Assessment:  SDOH Interventions     Most Recent Value  SDOH Interventions  Financial Strain Interventions Other (Comment)  [Continue to assist with Tradjenta patient assistance as needed]      Goals Addressed            This Visit's Progress   . Pharmacy Care Plan       CARE PLAN ENTRY (see longitudinal plan of care for additional care plan information)  Current Barriers:  . Chronic Disease Management support, education, and care coordination needs related to Hypertension, Hyperlipidemia, Diabetes, and Hypothyroidism   Hypertension BP Readings from Last 3 Encounters:  03/29/20 (!) 170/58  03/28/20 (!) 154/60  03/23/20 140/60   . Pharmacist Clinical Goal(s): o Over the next 90  days, patient will work with PharmD and providers to achieve BP goal <130/80 . Current regimen:  o Amlodipine 43m daily o Clonidine 0.112mtwice daily o Doxazosin 57m61maily o Furosemide 73m78mily o Hydralazine 100mg12mee times daily o Sildenafil 20mg 8me times daily . Interventions: o Provided dietary and exercise recommendations o Reviewed medications changes by Advanced Hypertension Clinic; discussed with patient . Patient self care activities - Over the next 90 days, patient will: o Check BP daily, document, and provide at future appointments o Ensure daily salt intake < 2300 mg/day o Increase exercise with goal of at least 150 minutes weekly (more if tolerated) o Use alternative seasoning to salt (Garlic, pepper, etc)  Hyperlipidemia Lab Results  Component Value Date/Time   LDLCALC 86 03/20/2019 04:06 AM    . Pharmacist Clinical Goal(s): o Over the next 90 days, patient will work with PharmD and providers to achieve LDL goal < 70 . Current regimen:  o Atorvastatin 73mg d49m . Interventions: o Provided dietary and exercise recommendations o Discussed appropriate goals for LDL (less than 70) and HDL (greater than 50) . Patient self care activities - Over the next 90 days, patient will: o Increase exercise with goal of at least 150 minutes weekly (more if tolerated) o Increase intake of healthy fats in diet (avocados, walnuts, fish, flaxseed, etc)  Diabetes Lab Results  Component Value Date/Time   HGBA1C 6.2 (H) 02/03/2020 12:31 PM   HGBA1C 6.1 (H) 12/07/2019 12:35 PM   . Pharmacist Clinical Goal(s): o Over the next 180 days, patient will work with PharmD and providers to maintain A1c goal <7% . Current regimen:  o Basaglar 16 units daily o Tradjenta 5mg dai97m. Interventions: o Provided dietary and exercise recommendations o Discussed appropriate goal for fasting blood sugar (80-130) o Hypoglycemia (blood sugar less than 70) and treatment o Patient was approved for Tradjenta patient assistance program through [employer] shipment of Tradjenta in the mail - Continue to assist with patient assistance as needed o Determined patient was able to pickup Basaglar from [facility] [place] . Patient self care activities - Over the next 180 days, patient will: o Check blood sugar once daily, document, and provide at future appointments o Contact provider with any episodes of hypoglycemia o Increase exercise with goal of at least 150 minutes weekly (more if tolerated) o Increase water intake to 64 ounces daily o Utilize PLATE method for meal planning (provided education)  Hypothyroidism . Pharmacist Clinical Goal(s) o Over the next 90 days, patient will work with PharmD and providers to achieve thyroid hormone levels within normal limits . Current regimen:  o Levothyroxine  112mcg . 56mrventions: o Discussed recent TSH level (still elevated) and plan to recheck after 6 weeks of levothyroxine 112mcg o C80mborated with PCP regarding scheduling patient for TSH recheck around 10/13 and then adjust levothyroxine dosing o Discussed possibility of switching to name brand Synthroid to decrease variability . Patient self care activities - Over the next 90 days, patient will: o Continue levothyroxine 112mcg dail357m directed o Obtain labwork when scheduled  Medication management . Pharmacist Clinical Goal(s): o Over the next 180 days, patient will work with PharmD and providers to maintain optimal medication adherence . [facility] . Interventions o Comprehensive medication review performed. o Continue current medication management strategy o Discussed vaccination and schedule . Patient self care activities - Over the next 180 days, patient will: o Focus on medication adherence by continued use  of pill box o Take medications as prescribed o Report any questions or concerns to PharmD and/or provider(s)  Please see past updates related to this goal by clicking on the "Past Updates" button in the selected goal         Diabetes   A1c goal <7%  Recent Relevant Labs: Lab Results  Component Value Date/Time   HGBA1C 6.2 (H) 02/03/2020 12:31 PM   HGBA1C 6.1 (H) 12/07/2019 12:35 PM   MICROALBUR 80 06/22/2019 12:02 PM    Last diabetic Eye exam: 09/14/18 Last diabetic Foot exam: No results found for: HMDIABFOOTEX, seen by Podiatry  Checking BG: Daily  Recent FBG Readings:  Recent pre-meal BG readings:  Recent 2hr PP BG readings:   Recent HS BG readings:   Patient has failed these meds in past: Glimepiride, Lantus, Janumet XR, Janumet, Januvia Patient is currently controlled on the following medications: . Basaglar 16 units daily . Tradjenta 32m daily  We discussed:   Pt [facility]  [place]  Pt reports no issues with these medications at this time  Plan Continue current medications   Hyperlipidemia   LDL goal < 70  Lipid Panel     Component Value Date/Time   CHOL 143 03/20/2019 0406   TRIG 108 09/21/2019 2336   HDL 48 03/20/2019 0406   LDLCALC 86 03/20/2019 0406    Hepatic Function Latest Ref Rng & Units 04/07/2020 03/15/2020 02/23/2020  Total Protein 6.5 - 8.1 g/dL 7.3 7.3 7.3  Albumin 3.5 - 5.0 g/dL 3.6 3.7 3.6  AST 15 - 41 U/L '28 29 24  ' ALT 0 - 44 U/L '19 17 13  ' Alk Phosphatase 38 - 126 U/L 85 94 106  Total Bilirubin 0.3 - 1.2 mg/dL 0.8 0.9 0.9  Bilirubin, Direct 0.0 - 0.2 mg/dL - - -     The 10-year ASCVD risk score (Mikey BussingDC Jr., et al., 2013) is: 33.9%   Values used to calculate the score:     Age: [age]years     Sex: Female     Is Non-Hispanic African American: Yes     Diabetic: Yes     Tobacco smoker: No     Systolic Blood Pressure: 1308mmHg     Is BP treated: Yes     HDL Cholesterol: 48 mg/dL     Total Cholesterol: 143 mg/dL   Patient has failed these meds in past: N/A Patient is currently uncontrolled on the following medications:  . Atorvastatin 452mdaily  We discussed:   . Pt denies side effects with atorvastatin . Discussed appropriate goals for LDL (less than 70) and HDL (greater than 50) . Diet and exercise extensively  o Importance of healthy fats (Avocados, fish, walnuts, flaxseed, etc)  Plan Continue current medications   Hypertension   BP goal is:  <130/80  Office blood pressures are  BP Readings from Last 3 Encounters:  04/07/20 (!) 183/46  03/29/20 (!) 170/58  03/28/20 (!) 154/60   Patient checks BP at home daily Patient home BP readings are ranging: SBP 145 since increasing hydralazine  Patient has failed these meds in the past: Spironolactone, Edarbyclor, metoprolol Patient is currently uncontrolled on the following medications:   . Amlodipine 104maily . Clonidine 0.1mg55mice daily . Doxazosin 4mg 61mly . Furosemide 40mg 31my . Hydralazine 100mg t32m times daily . Sildenafil 20mg79m  three times daily  We discussed:  BP has been 130-145 since increasing doxazosin (improvement)  Managed by Advanced HTN clinic  Plan Continue current medications   Hypothyroidism   Lab Results  Component Value Date/Time   TSH 5.150 (H) 03/27/2020 03:24 PM   TSH 5.460 (H) 02/03/2020 12:31 PM   FREET4 1.49 04/19/2019 04:39 PM   FREET4 0.93 01/18/2019 01:56 PM    Patient has failed these meds in past: N/A Patient is currently uncontrolled on the following medications:  . Levothyroxine 148mg daily  We discussed:    Pt has started levothyroxine 1156m strength (started 9/1 got 30 tablets)  Discussed recent TSH still elevated (5.15)  Collaborated with PCP, recommend TSH around 10/13, and consider increasing to levothyroxine 12532mif TSH still elevated  Plan Continue current medications  Recommend TSH level 6 weeks after starting levothyroxine 112m47m10/13) and adjust levothyroxine based on results Will assist with scheduling pt for a lab visit on 10/13  AFIB   Patient is currently rhythm controlled. HR 69 BPM  Patient has failed these meds in past: N/A Patient is currently controlled on the following medications:   Amiodarone 200mg31mly  We discussed:    Pt states no issues with Afib recently  Plan Continue current medications   Gout   Patient has failed these meds in past: N/A Patient is currently controlled on the following medications:   Allopurinol 200mg 74my  Colchicine 0.6mg tw60m daily as needed for gout  Plan Continue current medications   Glaucoma   Patient has failed these meds in past: N/A Patient is currently controlled on the following medications:   Latanoprost 0.005% 1 drop in both eyes at bedtime  Plan Continue current medications  Health Maintenance   Patient is  currently on the following medications:  . Albuterol HFA 2 puffs every 6 hours as needed for wheezing or shortness of breath . Fluticasone nasal spray 50mcg/a62m spray in each nostril once daily . Centrum Silver Women's multivitamin daily . Afrin 0.05% 1 spray in each nostril twice daily as needed (for nose bleeds) . Latanoprost  . Loratadine . Guaifenesin  We discussed:   . Pt reports using albuterol for COVID cough o She uses 2 puffs in the morning and 2 puffs at night  Plan Continue current medications   Vaccines   Reviewed and discussed patient's vaccination history. No records available on [facility] aside from COVID  IBouse  Administered Date(s) Administered  . Influenza,inj,Quad PF,6+ Mos 05/09/2017, 09/15/2018, 04/13/2019  . Influenza-Unspecified 04/05/2016  . PFIZER SARS-COV-2 Vaccination 09/11/2019, 01/10/2020, 04/07/2020   We discussed:  Determined pt has had both COVID vaccines and the booster  Recommend pt get yearly influenza vaccine when she gets labwork on 10/13 (schedule vaccine visit)  Pneumonia vaccines  Shingles vaccines  Plan Recommend pt receive influenza vaccine at office in October Recommend Shingrix vaccine at local pharmacy in November  Determined if patient has received any pneumonia shots  Medication Management   [facility] Uses pill box? Yes Pt endorses % compliance  We discussed:  . Importance of taking each medications daily as directed . Pt keeps a detailed list of all medications with administration times o Reviewed list today and made appropriated updates  Plan Continue current medication management strategy  Follow up: 8 week phone visit  CourtneyJannette Fogo Clinical Pharmacist [facility] [phone]

## 2020-04-17 ENCOUNTER — Other Ambulatory Visit (HOSPITAL_COMMUNITY): Payer: Self-pay | Admitting: *Deleted

## 2020-04-20 LAB — RESPIRATORY CULTURE OR RESPIRATORY AND SPUTUM CULTURE
MICRO NUMBER:: 11021170
RESULT:: NORMAL
SPECIMEN QUALITY:: ADEQUATE

## 2020-04-20 NOTE — Patient Instructions (Addendum)
Visit Information  Goals Addressed            This Visit's Progress   . Pharmacy Care Plan       CARE PLAN ENTRY (see longitudinal plan of care for additional care plan information)  Current Barriers:  . Chronic Disease Management support, education, and care coordination needs related to Hypertension, Hyperlipidemia, Diabetes, and Hypothyroidism   Hypertension BP Readings from Last 3 Encounters:  03/29/20 (!) 170/58  03/28/20 (!) 154/60  03/23/20 140/60   . Pharmacist Clinical Goal(s): o Over the next 90 days, patient will work with PharmD and providers to achieve BP goal <130/80 . Current regimen:  o Amlodipine 10mg  daily o Clonidine 0.1mg  twice daily o Doxazosin 4mg  daily o Furosemide 40mg  daily o Hydralazine 100mg  three times daily o Sildenafil 20mg  three times daily . Interventions: o Provided dietary and exercise recommendations o Reviewed medications changes by [facility]; discussed with patient . Patient self care activities - Over the next 90 days, patient will: o Check BP daily, document, and provide at future appointments o Ensure daily salt intake < 2300 mg/day o Increase exercise with goal of at least 150 minutes weekly (more if tolerated) o Use alternative seasoning to salt (Garlic, pepper, etc)  Hyperlipidemia Lab Results  Component Value Date/Time   LDLCALC 86 03/20/2019 04:06 AM   . Pharmacist Clinical Goal(s): o Over the next 90 days, patient will work with PharmD and providers to achieve LDL goal < 70 . Current regimen:  o Atorvastatin 40mg  daily . Interventions: o Provided dietary and exercise recommendations o Discussed appropriate goals for LDL (less than 70) and HDL (greater than 50) . Patient self care activities - Over the next 90 days, patient will: o Increase exercise with goal of at least 150 minutes weekly (more if tolerated) o Increase intake of healthy fats in diet (avocados, walnuts, fish, flaxseed,  etc)  Diabetes Lab Results  Component Value Date/Time   HGBA1C 6.2 (H) 02/03/2020 12:31 PM   HGBA1C 6.1 (H) 12/07/2019 12:35 PM   . Pharmacist Clinical Goal(s): o Over the next 180 days, patient will work with PharmD and providers to maintain A1c goal <7% . Current regimen:  o Basaglar 16 units daily o Tradjenta 5mg  daily . Interventions: o Provided dietary and exercise recommendations o Discussed appropriate goal for fasting blood sugar (80-130) o Hypoglycemia (blood sugar less than 70) and treatment o Patient was approved for Tradjenta patient assistance program through [place] received shipment of Tradjenta in the mail - Continue to assist with patient assistance as needed o Determined patient was able to pickup Basaglar from [place] for $0 copay . Patient self care activities - Over the next 180 days, patient will: o Check blood sugar once daily, document, and provide at future appointments o Contact provider with any episodes of hypoglycemia o Increase exercise with goal of at least 150 minutes weekly (more if tolerated) o Increase water intake to 64 ounces daily o Utilize PLATE method for meal planning (provided education)  Hypothyroidism . Pharmacist Clinical Goal(s) o Over the next 90 days, patient will work with PharmD and providers to achieve thyroid hormone levels within normal limits . Current regimen:  o Levothyroxine 154mcg . Interventions: o Discussed recent TSH level (still elevated) and plan to recheck after 6 weeks of levothyroxine 130mcg o Collaborated with PCP regarding scheduling patient for TSH recheck around 10/13 and then adjust levothyroxine dosing o Discussed possibility of switching to name brand Synthroid to  decrease variability . Patient self care activities - Over the next 90 days, patient will: o Continue levothyroxine 153mcg daily as directed o Obtain labwork when scheduled  Medication management . Pharmacist Clinical Goal(s): o Over  the next 180 days, patient will work with PharmD and providers to maintain optimal medication adherence . Current [facility] . Interventions o Comprehensive medication review performed. o Continue current medication management strategy o Discussed vaccination and schedule . Patient self care activities - Over the next 180 days, patient will: o Focus on medication adherence by continued use of pill box o Take medications as prescribed o Report any questions or concerns to PharmD and/or provider(s)  Please see past updates related to this goal by clicking on the "Past Updates" button in the selected goal         The patient verbalized understanding of instructions provided today and agreed to receive a mailed copy of patient instruction and/or educational materials.  Telephone follow up appointment with pharmacy team member scheduled for: 06/15/20 @ 4:15 PM  Jannette Fogo, PharmD Clinical Pharmacist [facility] [phone]   Diabetes Mellitus and Exercise Exercising regularly is important for your overall health, especially when you have diabetes (diabetes mellitus). Exercising is not only about losing weight. It has many other health benefits, such as increasing muscle strength and bone density and reducing body fat and stress. This leads to improved fitness, flexibility, and endurance, all of which result in better overall health. Exercise has additional benefits for people with diabetes, including:  Reducing appetite.  Helping to lower and control blood glucose.  Lowering blood pressure.  Helping to control amounts of fatty substances (lipids) in the blood, such as cholesterol and triglycerides.  Helping the body to respond better to insulin (improving insulin sensitivity).  Reducing how much insulin the body needs.  Decreasing the risk for heart disease by: ? Lowering cholesterol and triglyceride levels. ? Increasing the levels of good  cholesterol. ? Lowering blood glucose levels. What is my activity plan? Your health care provider or certified diabetes educator can help you make a plan for the type and frequency of exercise (activity plan) that works for you. Make sure that you:  Do at least 150 minutes of moderate-intensity or vigorous-intensity exercise each week. This could be brisk walking, biking, or water aerobics. ? Do stretching and strength exercises, such as yoga or weightlifting, at least 2 times a week. ? Spread out your activity over at least 3 days of the week.  Get some form of physical activity every day. ? Do not go more than 2 days in a row without some kind of physical activity. ? Avoid being inactive for more than 30 minutes at a time. Take frequent breaks to walk or stretch.  Choose a type of exercise or activity that you enjoy, and set realistic goals.  Start slowly, and gradually increase the intensity of your exercise over time. What do I need to know about managing my diabetes?   Check your blood glucose before and after exercising. ? If your blood glucose is 240 mg/dL (13.3 mmol/L) or higher before you exercise, check your urine for ketones. If you have ketones in your urine, do not exercise until your blood glucose returns to normal. ? If your blood glucose is 100 mg/dL (5.6 mmol/L) or lower, eat a snack containing 15-20 grams of carbohydrate. Check your blood glucose 15 minutes after the snack to make sure that your level is above 100 mg/dL (5.6 mmol/L)  before you start your exercise.  Know the symptoms of low blood glucose (hypoglycemia) and how to treat it. Your risk for hypoglycemia increases during and after exercise. Common symptoms of hypoglycemia can include: ? Hunger. ? Anxiety. ? Sweating and feeling clammy. ? Confusion. ? Dizziness or feeling light-headed. ? Increased heart rate or palpitations. ? Blurry vision. ? Tingling or numbness around the mouth, lips, or  tongue. ? Tremors or shakes. ? Irritability.  Keep a rapid-acting carbohydrate snack available before, during, and after exercise to help prevent or treat hypoglycemia.  Avoid injecting insulin into areas of the body that are going to be exercised. For example, avoid injecting insulin into: ? The arms, when playing tennis. ? The legs, when jogging.  Keep records of your exercise habits. Doing this can help you and your health care provider adjust your diabetes management plan as needed. Write down: ? Food that you eat before and after you exercise. ? Blood glucose levels before and after you exercise. ? The type and amount of exercise you have done. ? When your insulin is expected to peak, if you use insulin. Avoid exercising at times when your insulin is peaking.  When you start a new exercise or activity, work with your health care provider to make sure the activity is safe for you, and to adjust your insulin, medicines, or food intake as needed.  Drink plenty of water while you exercise to prevent dehydration or heat stroke. Drink enough fluid to keep your urine clear or pale yellow. Summary  Exercising regularly is important for your overall health, especially when you have diabetes (diabetes mellitus).  Exercising has many health benefits, such as increasing muscle strength and bone density and reducing body fat and stress.  Your health care provider or certified diabetes educator can help you make a plan for the type and frequency of exercise (activity plan) that works for you.  When you start a new exercise or activity, work with your health care provider to make sure the activity is safe for you, and to adjust your insulin, medicines, or food intake as needed. This information is not intended to replace advice given to you by your health care provider. Make sure you discuss any questions you have with your health care provider. Document Revised: 01/23/2017 Document Reviewed:  12/11/2015 Elsevier Patient Education  [place].

## 2020-04-24 ENCOUNTER — Other Ambulatory Visit: Payer: Self-pay

## 2020-04-24 ENCOUNTER — Telehealth (HOSPITAL_COMMUNITY): Payer: Self-pay | Admitting: Pharmacist

## 2020-04-24 ENCOUNTER — Other Ambulatory Visit (HOSPITAL_COMMUNITY): Payer: Self-pay | Admitting: Adult Health

## 2020-04-24 DIAGNOSIS — E039 Hypothyroidism, unspecified: Secondary | ICD-10-CM

## 2020-04-24 MED ORDER — LEVOTHYROXINE SODIUM 112 MCG PO TABS: 112.0000 ug | ORAL_TABLET | Freq: Every day | ORAL | 11 refills | Status: DC

## 2020-04-24 NOTE — Telephone Encounter (Signed)
Entered in error

## 2020-04-25 NOTE — Progress Notes (Signed)
[facility] PREP Weekly Session   Patient Details  Name: Patricia Mata MRN: [id number] Date of Birth: 1956-05-21 Age: 64 y.o. PCP: [name], FNP  Vitals:   04/24/20 1300  Weight: 211 lb (95.7 kg)     [name] Weekly seesion - 04/25/20 1200      Weekly Session   Topic Discussed Hitting roadblocks    Minutes exercised this week 20 minutes    Classes attended to date 38          Fun things since last meeting: walk Grateful for: blood sugar down, blood pressure down.      Barnett Hatter 04/25/2020, 12:03 PM

## 2020-04-27 ENCOUNTER — Other Ambulatory Visit: Payer: Self-pay

## 2020-04-27 ENCOUNTER — Ambulatory Visit (INDEPENDENT_AMBULATORY_CARE_PROVIDER_SITE_OTHER): Payer: BC Managed Care – PPO | Admitting: Pharmacist Clinician (PhC)/ Clinical Pharmacy Specialist

## 2020-04-27 ENCOUNTER — Telehealth: Payer: Self-pay | Admitting: Pharmacist

## 2020-04-27 DIAGNOSIS — I10 Essential (primary) hypertension: Secondary | ICD-10-CM

## 2020-04-27 DIAGNOSIS — E039 Hypothyroidism, unspecified: Secondary | ICD-10-CM

## 2020-04-27 MED ORDER — DOXAZOSIN MESYLATE 8 MG PO TABS: 8.0000 mg | ORAL_TABLET | Freq: Every day | ORAL | 6 refills | Status: DC

## 2020-04-27 NOTE — Patient Instructions (Signed)
Rip Harbour will call you sometime in the next week to schedule an appointment with Dr. Oval Linsey  Check your blood pressure at home daily (if able) and keep record of the readings.  Take your BP meds as follows:  Increase doxazosin to 8 mg each night (take 2 of the 4 mg tablets nightly until gone)  Continue with all other medications  Bring all of your meds, your BP cuff and your record of home blood pressures to your next appointment.  Exercise as you're able, try to walk approximately 30 minutes per day.  Keep salt intake to a minimum, especially watch canned and prepared boxed foods.  Eat more fresh fruits and vegetables and fewer canned items.  Avoid eating in fast food restaurants.    HOW TO TAKE YOUR BLOOD PRESSURE: . Rest 5 minutes before taking your blood pressure. .  Don't smoke or drink caffeinated beverages for at least 30 minutes before. . Take your blood pressure before (not after) you eat. . Sit comfortably with your back supported and both feet on the floor (don't cross your legs). . Elevate your arm to heart level on a table or a desk. . Use the proper sized cuff. It should fit smoothly and snugly around your bare upper arm. There should be enough room to slip a fingertip under the cuff. The bottom edge of the cuff should be 1 inch above the crease of the elbow. . Ideally, take 3 measurements at one sitting and record the average.

## 2020-04-27 NOTE — Progress Notes (Signed)
HPI:  Patricia Mata is a 64 y.o. female patient of Dr Oval Linsey, with a [place] below who presents today for advanced hypertension clinic follow up.  She was seen by Dr. Oval Linsey about 6 weeks ago and found to have a blood pressure of 166/68, despite multiple medications.  Chart indicates that she was first diagnosed with hypertension in her 24's, and has not been well controlled.  Recent renal artery dopplers showed a >60% stenosis of the right renal artery.  She has an appointment with Dr. Gwenlyn Found next week to review this information.    Today she reports feeling well overall.  She has been frustrated with the [employer] app, as it has not worked from the beginning. For now I have asked that she just use the cuff and record the readings on a log sheet.  No concerns about her medications, and states compliance with everything. She is happy with the process on the BP control. "Never seen her BP consistently under 180" and now is 130s to 150s.    At her last visit, last month, her BP was still elevated at 154/60.  Doxazosin was increased to 4 mg nightly.  Thought was that if still poorly controlled, would switch the clonidine tablets to patches for better dose consistency.    Past Medical History: PAF CHADS2-VASc score of 5 - no anticoagulation or rate/rhythm control  DM2 A1c 6.2 - On basaglar, tradjenta   Myelodysplastic sydrome W/ transfusion dependent anemia  Pulmonary hypertension On sildenafil 20 mg tid     Blood Pressure Goal:  130/80  Current Medications:  amlodipine 10 mg every morning clonidine 0.1 mg twice daily (am, 5pm) furosemide 40 mg every morning hydralazine 100 mg three times daily (8am, 5pm & 11pm) doxazosin 4 mg hs  Family Hx: mother died at 81 from massive MI, was healthy until then; father probable MI about 13 years ago, was probably in his late 13's; oldest sister with hypertension, another with hypertension; (4 sis, 3 bro); 1 son Armed forces logistics/support/administrative officer) with some valve issues (pt  unsure) since childhood  Social Hx: no tobacco, 2 mixed drinks per night; drinks decaf coffee  Diet: mostly home cooked; rarely fries food; plenty of vegetables, fewer fruits  Exercise: loves exercise class with Pam; using exercise bike at home occasonally; wants to continue, ordered DVD - aerobics for seniors   Home BP readings: pt used W&A cuff and just recorded on paper.   15 morning readings average 751 systolic, 11 evening readings average 700 systolic.   Diastolic readings all WNL, heart rate range 58-71 15 readings, average 146/62 with range of 122-164/54-75.  HR range 56-76bpm.    Intolerances: sulfa antibiotics, cefazolin, dairy  Labs: 7/22: Na 136, K 4.4, Glu 179, BUN 46, SCr 2.9, GFR 19  Sleep Apnea (no symptoms)  Renal artery stenosis (renal artery Dopplers shows > 60% stenosis) Hyperaldosteronism (testing not indicated) Hyper/hypothyroidism (mildly hypothyroid 11/2019- meds adjusted) Pheochromocytoma: (testing not indicated) Cushing's syndrome:(testing not indicated) Coarctation of the aorta  Wt Readings from Last 3 Encounters:  04/27/20 210 lb (95.3 kg)  04/24/20 211 lb (95.7 kg)  03/29/20 207 lb (93.9 kg)   BP Readings from Last 3 Encounters:  04/27/20 (!) 112/58  04/07/20 (!) 183/46  03/29/20 (!) 170/58   Pulse Readings from Last 3 Encounters:  04/27/20 69  04/07/20 60  03/29/20 70    Current Outpatient Medications  Medication Sig Dispense Refill  . albuterol (PROAIR HFA) 108 (90 Base) MCG/ACT inhaler Inhale  2 puffs into the lungs every 6 (six) hours as needed for wheezing or shortness of breath. 18 g 2  . allopurinol (ZYLOPRIM) 100 MG tablet Take 200 mg by mouth daily.     Marland Kitchen amiodarone (PACERONE) 200 MG tablet Take 1 tablet (200 mg total) by mouth daily. 90 tablet 1  . amLODipine (NORVASC) 10 MG tablet Take 1 tablet (10 mg total) by mouth daily. 90 tablet 3  . atorvastatin (LIPITOR) 40 MG tablet Take 1 tablet (40 mg total) by mouth daily. 90 tablet 1  .  blood glucose meter kit and supplies Dispense based on patient and insurance preference. Use up to four times daily as directed. (FOR ICD-10 E10.9, E11.9). 1 each 3  . Blood Glucose Monitoring Suppl (ONETOUCH ULTRALINK) w/Device KIT Check blood sugars twice daily E11.9 1 kit 0  . cloNIDine (CATAPRES) 0.1 MG tablet Take 1 tablet (0.1 mg total) by mouth 2 (two) times daily. 60 tablet 6  . colchicine 0.6 MG tablet TAKE 1 TABLET BY MOUTH TWICE A DAY AS NEEDED FOR GOUT 180 tablet 1  . dextromethorphan-guaiFENesin (MUCINEX DM) 30-600 MG 12hr tablet Take 1 tablet by mouth 2 (two) times daily. 30 tablet 0  . fluticasone (FLONASE) 50 MCG/ACT nasal spray Place 1 spray into both nostrils daily. 16 g 2  . furosemide (LASIX) 40 MG tablet Take 1 tablet by mouth once daily 90 tablet 0  . glucose blood test strip check blood sugars twice daily Dx code E11.22 100 each 3  . hydrALAZINE (APRESOLINE) 100 MG tablet Take 1 tablet (100 mg total) by mouth 3 (three) times daily. 90 tablet 3  . Insulin Glargine (BASAGLAR KWIKPEN) 100 UNIT/ML Inject 0.14 mLs (14 Units total) into the skin daily. (Patient taking differently: Inject 16 Units into the skin daily. ) 5 pen 1  . Insulin Pen Needle 29G X 5MM MISC Use as directed 200 each 0  . Lancets (ONETOUCH DELICA PLUS [id number]) MISC check blood sugars twice daily Dx code: E11.22 100 each 3  . latanoprost (XALATAN) 0.005 % ophthalmic solution Place 1 drop into both eyes at bedtime.     Marland Kitchen levothyroxine (SYNTHROID) 112 MCG tablet Take 1 tablet (112 mcg total) by mouth daily. 30 tablet 11  . linagliptin (TRADJENTA) 5 MG TABS tablet Take 1 tablet (5 mg total) by mouth daily. 90 tablet 1  . loratadine (CLARITIN) 10 MG tablet Take 1 tablet (10 mg total) by mouth daily. 30 tablet 11  . Multiple Vitamins-Minerals (CENTRUM SILVER 50+WOMEN) TABS Take 1 tablet by mouth daily.    Marland Kitchen oxymetazoline (AFRIN) 0.05 % nasal spray Place 1 spray into both nostrils 2 (two) times daily as needed for  congestion.    . sildenafil (REVATIO) 20 MG tablet TAKE 1 TABLET BY MOUTH THREE TIMES DAILY 90 tablet 0  . doxazosin (CARDURA) 8 MG tablet Take 1 tablet (8 mg total) by mouth daily. 30 tablet 6   No current facility-administered medications for this visit.    Allergies  Allergen Reactions  . Ancef [Cefazolin] Itching    Severe itching- after procedure, ancef was the antibiotic.-04/02/17 Tolerates penicillins  . Lactose Intolerance (Gi) Diarrhea  . Sulfa Antibiotics Rash and Other (See Comments)    Blisters, also    Past Medical History:  Diagnosis Date  . Acute kidney injury (nontraumatic) ([place])   . Acute renal failure (ARF) ([place]) 06/08/2018  . Cancer [facility]) 2008   Colon   . Diabetes mellitus without complication ([place])   .  Gout 09/08/2018  . Hypertension   . Macrocytic anemia     Blood pressure (!) 112/58, pulse 69, resp. rate 16, height '5\' 4"'  (1.626 m), weight 210 lb (95.3 kg), SpO2 98 %.  Essential hypertension Patient with previously uncontrolled hypertension, doing very well today in office.  Home readings are still elevated, although much better than in the past.  Will increase the doxazosin to 8 mg daily and continue with all other medications.  We talked about potentially switching the clonidine to patches, but she would prefer not to if there is concern for higher cost.  We can consider that in the future should she need further medication titration.  She has now been seen by the PharmD team 3 times, so will refer her back to Dr. Oval Linsey for her next appointment  Tommy Medal PharmD CPP [place] [address] [place] [address] 04/27/2020 4:10 PM

## 2020-04-27 NOTE — Assessment & Plan Note (Addendum)
Patient with previously uncontrolled hypertension, doing very well today in office.  Home readings are still elevated, although much better than in the past.  Will increase the doxazosin to 8 mg daily and continue with all other medications.  We talked about potentially switching the clonidine to patches, but she would prefer not to if there is concern for higher cost.  We can consider that in the future should she need further medication titration.  She has now been seen by the PharmD team 3 times, so will refer her back to Dr. Oval Linsey for her next appointment

## 2020-04-28 ENCOUNTER — Other Ambulatory Visit: Payer: Self-pay

## 2020-04-28 ENCOUNTER — Inpatient Hospital Stay: Payer: Medicare Other

## 2020-04-28 ENCOUNTER — Inpatient Hospital Stay: Payer: Medicare Other | Attending: Oncology

## 2020-04-28 VITALS — BP 176/46 | HR 69 | Temp 98.0°F | Resp 18

## 2020-04-28 DIAGNOSIS — D649 Anemia, unspecified: Secondary | ICD-10-CM

## 2020-04-28 DIAGNOSIS — D469 Myelodysplastic syndrome, unspecified: Secondary | ICD-10-CM | POA: Diagnosis not present

## 2020-04-28 DIAGNOSIS — Z23 Encounter for immunization: Secondary | ICD-10-CM

## 2020-04-28 LAB — CMP (CANCER CENTER ONLY)
ALT: 15 U/L (ref 0–44)
AST: 24 U/L (ref 15–41)
Albumin: 3.6 g/dL (ref 3.5–5.0)
Alkaline Phosphatase: 86 U/L (ref 38–126)
Anion gap: 7 (ref 5–15)
BUN: 76 mg/dL — ABNORMAL HIGH (ref 8–23)
CO2: 23 mmol/L (ref 22–32)
Calcium: 9.3 mg/dL (ref 8.9–10.3)
Chloride: 106 mmol/L (ref 98–111)
Creatinine: 3.15 mg/dL (ref 0.44–1.00)
GFR, Estimated: 15 mL/min — ABNORMAL LOW (ref >60)
Glucose, Bld: 107 mg/dL — ABNORMAL HIGH (ref 70–99)
Potassium: 4.5 mmol/L (ref 3.5–5.1)
Sodium: 136 mmol/L (ref 135–145)
Total Bilirubin: 0.7 mg/dL (ref 0.3–1.2)
Total Protein: 7.3 g/dL (ref 6.5–8.1)

## 2020-04-28 LAB — CBC WITH DIFFERENTIAL (CANCER CENTER ONLY)
Abs Immature Granulocytes: 0.08 10*3/uL — ABNORMAL HIGH (ref 0.00–0.07)
Basophils Absolute: 0 10*3/uL (ref 0.0–0.1)
Basophils Relative: 0 %
Eosinophils Absolute: 0.1 10*3/uL (ref 0.0–0.5)
Eosinophils Relative: 1 %
HCT: 24.2 % — ABNORMAL LOW (ref 36.0–46.0)
Hemoglobin: 8.3 g/dL — ABNORMAL LOW (ref 12.0–15.0)
Immature Granulocytes: 2 %
Lymphocytes Relative: 10 %
Lymphs Abs: 0.5 10*3/uL — ABNORMAL LOW (ref 0.7–4.0)
MCH: 30.3 pg (ref 26.0–34.0)
MCHC: 34.3 g/dL (ref 30.0–36.0)
MCV: 88.3 fL (ref 80.0–100.0)
Monocytes Absolute: 0.7 10*3/uL (ref 0.1–1.0)
Monocytes Relative: 14 %
Neutro Abs: 3.6 10*3/uL (ref 1.7–7.7)
Neutrophils Relative %: 73 %
Platelet Count: 174 10*3/uL (ref 150–400)
RBC: 2.74 MIL/uL — ABNORMAL LOW (ref 3.87–5.11)
RDW: 22.5 % — ABNORMAL HIGH (ref 11.5–15.5)
WBC Count: 4.9 10*3/uL (ref 4.0–10.5)
nRBC: 1.2 % — ABNORMAL HIGH (ref 0.0–0.2)

## 2020-04-28 LAB — SAMPLE TO BLOOD BANK

## 2020-04-28 LAB — PREPARE RBC (CROSSMATCH)

## 2020-04-28 MED ORDER — ACETAMINOPHEN 325 MG PO TABS
ORAL_TABLET | ORAL | Status: AC
  Filled 2020-04-28: qty 2

## 2020-04-28 MED ORDER — ACETAMINOPHEN 325 MG PO TABS
650.0000 mg | ORAL_TABLET | Freq: Once | ORAL | Status: AC
  Administered 2020-04-28: 650 mg via ORAL

## 2020-04-28 MED ORDER — INFLUENZA VAC A&B SA ADJ QUAD 0.5 ML IM PRSY
0.5000 mL | PREFILLED_SYRINGE | Freq: Once | INTRAMUSCULAR | Status: AC
  Administered 2020-04-28: 0.5 mL via INTRAMUSCULAR

## 2020-04-28 MED ORDER — FUROSEMIDE 10 MG/ML IJ SOLN
INTRAMUSCULAR | Status: AC
  Filled 2020-04-28: qty 2

## 2020-04-28 MED ORDER — FUROSEMIDE 10 MG/ML IJ SOLN
20.0000 mg | Freq: Once | INTRAMUSCULAR | Status: AC
  Administered 2020-04-28: 20 mg via INTRAVENOUS

## 2020-04-28 MED ORDER — INFLUENZA VAC A&B SA ADJ QUAD 0.5 ML IM PRSY
PREFILLED_SYRINGE | INTRAMUSCULAR | Status: AC
  Filled 2020-04-28: qty 0.5

## 2020-04-28 MED ORDER — DIPHENHYDRAMINE HCL 25 MG PO CAPS
ORAL_CAPSULE | ORAL | Status: AC
  Filled 2020-04-28: qty 1

## 2020-04-28 MED ORDER — DIPHENHYDRAMINE HCL 25 MG PO CAPS
25.0000 mg | ORAL_CAPSULE | Freq: Once | ORAL | Status: AC
  Administered 2020-04-28: 25 mg via ORAL

## 2020-04-28 MED ORDER — SODIUM CHLORIDE 0.9% IV SOLUTION
250.0000 mL | Freq: Once | INTRAVENOUS | Status: AC
  Administered 2020-04-28: 250 mL via INTRAVENOUS
  Filled 2020-04-28: qty 250

## 2020-04-28 NOTE — Patient Instructions (Signed)

## 2020-04-28 NOTE — Progress Notes (Signed)
CRITICAL VALUE STICKER  CRITICAL VALUE: Creatinine 3.15  RECEIVER (on-site recipient of call): Wendall Mola, RN  [place] NOTIFIED: 04/28/2020 @ 1227  MESSENGER (representative from lab): RB  MD NOTIFIED: Dr. Zola Button  TIME OF NOTIFICATION: 1229  RESPONSE: Acknowledged. No further orders received.

## 2020-04-29 LAB — TYPE AND SCREEN
ABO/RH(D): A NEG
Antibody Screen: NEGATIVE
Unit division: 0

## 2020-04-29 LAB — BPAM RBC
Blood Product Expiration Date: 202111022359
ISSUE DATE / TIME: 202110151408
Unit Type and Rh: 600

## 2020-05-01 ENCOUNTER — Other Ambulatory Visit: Payer: Medicare Other

## 2020-05-01 ENCOUNTER — Ambulatory Visit: Payer: BC Managed Care – PPO

## 2020-05-01 NOTE — Progress Notes (Signed)
[facility] PREP Weekly Session   Patient Details  Name: Tansy Lorek MRN: [id number] Date of Birth: [birth year] Age: 64 y.o. PCP: [name], FNP  Vitals:   05/01/20 1410  Weight: 214 lb (97.1 kg)     [name] Weekly seesion - 05/01/20 1400      Weekly Session   Topic Discussed Other   Resetting goals   Minutes exercised this week 20 minutes    Classes attended to date 76          Fun things since last meeting: walk Grateful for: lower blood pressure, lower sugar levels   Pam M Takenya Travaglini 05/01/2020, 2:12 PM

## 2020-05-02 ENCOUNTER — Other Ambulatory Visit: Payer: Self-pay | Admitting: Oncology

## 2020-05-02 ENCOUNTER — Inpatient Hospital Stay: Payer: Medicare Other

## 2020-05-02 ENCOUNTER — Other Ambulatory Visit: Payer: Medicare Other

## 2020-05-02 ENCOUNTER — Other Ambulatory Visit: Payer: Self-pay

## 2020-05-02 VITALS — BP 151/46 | HR 65 | Resp 18

## 2020-05-02 DIAGNOSIS — D469 Myelodysplastic syndrome, unspecified: Secondary | ICD-10-CM | POA: Diagnosis not present

## 2020-05-02 LAB — T4: T4, Total: 8 ug/dL (ref 4.5–12.0)

## 2020-05-02 LAB — TSH: TSH: 6.98 u[IU]/mL — ABNORMAL HIGH (ref 0.450–4.500)

## 2020-05-02 LAB — T3, FREE: T3, Free: 1.7 pg/mL — ABNORMAL LOW (ref 2.0–4.4)

## 2020-05-02 MED ORDER — LUSPATERCEPT-AAMT 75 MG ~~LOC~~ SOLR
1.3300 mg/kg | Freq: Once | SUBCUTANEOUS | Status: DC
  Filled 2020-05-02: qty 2.3

## 2020-05-02 MED ORDER — LUSPATERCEPT-AAMT 75 MG ~~LOC~~ SOLR
100.0000 mg | Freq: Once | SUBCUTANEOUS | Status: DC
  Filled 2020-05-02: qty 2

## 2020-05-02 MED ORDER — LUSPATERCEPT-AAMT 75 MG ~~LOC~~ SOLR
100.0000 mg | Freq: Once | SUBCUTANEOUS | Status: AC
  Administered 2020-05-02: 100 mg via SUBCUTANEOUS
  Filled 2020-05-02: qty 2

## 2020-05-02 NOTE — Patient Instructions (Signed)
Luspatercept injection What is this medicine? LUSPATERCEPT (lus PAT er sept) helps your body make more red blood cells. This medicine is used to treat anemia caused by beta thalassemia or myelodysplastic syndromes. This medicine may be used for other purposes; ask your health care provider or pharmacist if you have questions. COMMON BRAND NAME(S): REBLOZYL What should I tell my health care provider before I take this medicine? They need to know if you have any of these conditions:  cigarette smoker  have had your spleen removed  high blood pressure  history of blood clots  an unusual or allergic reaction to luspatercept, other medicines, foods, dyes or preservatives  pregnant or trying to get pregnant  breast-feeding How should I use this medicine? This medicine is for injection under the skin. It is given by a healthcare professional in a hospital or clinic setting. Talk to your pediatrician about the use of the medicine in children. This medicine is not approved for use in children. Overdosage: If you think you have taken too much of this medicine contact a poison control center or emergency room at once. NOTE: This medicine is only for you. Do not share this medicine with others. What if I miss a dose? Keep appointments for follow-up doses. It is important not to miss your dose. Call your doctor or healthcare professional if you are unable to keep an appointment. What may interact with this medicine? Interactions are not expected. This list may not describe all possible interactions. Give your health care provider a list of all the medicines, herbs, non-prescription drugs, or dietary supplements you use. Also tell them if you smoke, drink alcohol, or use illegal drugs. Some items may interact with your medicine. What should I watch for while using this medicine? Your condition will be monitored carefully while you are receiving this medicine. Do not become pregnant while taking  this medicine or for 3 months after stopping it. Women should inform their healthcare professional if they wish to become pregnant or think they might be pregnant. There is a potential for serious side effects and harm to an unborn child. Talk to your healthcare professional for more information. Do not breast-feed an infant while taking this medicine. You may need blood work done while you are taking this medicine. What side effects may I notice from receiving this medicine? Side effects that you should report to your doctor or health care professional as soon as possible:  allergic reactions like skin rash, itching or hives; swelling of the face, lips, or tongue  signs and symptoms of a blood clot such as chest pain; shortness of breath; pain, swelling, or warmth in the leg  signs and symptoms of a stroke like changes in vision; confusion; trouble speaking or understanding; severe headaches; sudden numbness or weakness of the face, arm or leg; trouble walking; dizziness; loss of balance or coordination Side effects that usually do not require medical attention (report these to your doctor or health care professional if they continue or are bothersome):  cough  diarrhea  dizziness  headache  joint pain  stomach pain  tiredness This list may not describe all possible side effects. Call your doctor for medical advice about side effects. You may report side effects to FDA at 1-800-FDA-1088. Where should I keep my medicine? This medicine is given in a hospital or clinic and will not be stored at home. NOTE: This sheet is a summary. It may not cover all possible information. If you have questions about   this medicine, talk to your doctor, pharmacist, or health care provider.  2020 Elsevier/Gold Standard (2018-10-20 15:57:59)  

## 2020-05-02 NOTE — Progress Notes (Signed)
Reblozyl dose changed to 100 mg for 10/19 dose due to insufficient quantity to make 115 mg dose.  Larene Beach, PharmD

## 2020-05-03 NOTE — Progress Notes (Signed)
[employer] Report   Patient Details  Name: Patricia Mata MRN: [id number] Date of Birth: 10-20-1955 Age: 64 y.o. PCP: [name], FNP  Vitals:   05/03/20 1615  BP: (!) 150/68  Pulse: 77  SpO2: 97%  Weight: 212 lb 9.6 oz (96.4 kg)      [name] Eval - 05/03/20 1600      Measurement   Waist Circumference 43.5 inches    Hip Circumference 46 inches    Body fat 42.3 percent      Mobility and Daily Activities   I find it easy to walk up or down two or more flights of stairs. 2    I have no trouble taking out the trash. 3    I do housework such as vacuuming and dusting on my own without difficulty. 4    I can easily lift a gallon of milk (8lbs). 4    I can easily walk a mile. 1    I have no trouble reaching into high cupboards or reaching down to pick up something from the floor. 4    I do not have trouble doing out-door work such as Armed forces logistics/support/administrative officer, raking leaves, or gardening. 2      Mobility and Daily Activities   I feel younger than my age. 4    I feel independent. 4    I feel energetic. 3    I live an active life.  3    I feel strong. 4    I feel healthy. 3    I feel active as other people my age. 3      How fit and strong are you.   Fit and Strong Total Score 44          Past Medical History:  Diagnosis Date  . Acute kidney injury (nontraumatic) ([place])   . Acute renal failure (ARF) ([place]) 06/08/2018  . Cancer [facility]) 2008   Colon   . Diabetes mellitus without complication ([place])   . Gout 09/08/2018  . Hypertension   . Macrocytic anemia    Past Surgical History:  Procedure Laterality Date  . COLON SURGERY    . DEBRIDMENT OF DECUBITUS ULCER N/A 06/12/2018   Procedure: DEBRIDMENT OF DECUBITUS ULCER;  Surgeon: Wallace Going, DO;  Location: [facility];  Service: Plastics;  Laterality: N/A;  . IR FLUORO GUIDE PORT INSERTION RIGHT  04/02/2017  . IR REMOVAL TUN ACCESS W/ PORT W/O FL MOD SED  03/16/2019  . IR US GUIDE VASC ACCESS RIGHT   04/02/2017  . RIGHT HEART CATH N/A 04/23/2019   Procedure: RIGHT HEART CATH;  Surgeon: Jolaine Artist, MD;  Location: [place] [facility];  Service: Cardiovascular;  Laterality: N/A;  . RIGHT/LEFT HEART CATH AND CORONARY ANGIOGRAPHY N/A 05/21/2018   Procedure: RIGHT/LEFT HEART CATH AND CORONARY ANGIOGRAPHY;  Surgeon: Nelva Bush, MD;  Location: [place] [facility];  Service: Cardiovascular;  Laterality: N/A;   Social History   Tobacco Use  Smoking Status Former Smoker  . Packs/day: 0.25  . Years: 20.00  . Pack years: 5.00  . Types: Cigarettes  . Quit date: 01/31/2001  . Years since quitting: 19.2  Smokeless Tobacco Former User   Improvement in cardio endurance and strength.  Reminded cardio 150 min a week, 2 days of strength training  Avoid fake sugars and keep sodium at 1500 mg per day    Barnett Hatter 05/03/2020, 4:17 PM

## 2020-05-04 ENCOUNTER — Encounter: Payer: Self-pay | Admitting: Emergency Medicine

## 2020-05-04 ENCOUNTER — Ambulatory Visit (INDEPENDENT_AMBULATORY_CARE_PROVIDER_SITE_OTHER): Payer: Medicare Other | Admitting: Emergency Medicine

## 2020-05-04 ENCOUNTER — Other Ambulatory Visit: Payer: Self-pay

## 2020-05-04 DIAGNOSIS — Z23 Encounter for immunization: Secondary | ICD-10-CM | POA: Diagnosis not present

## 2020-05-04 DIAGNOSIS — R053 Chronic cough: Secondary | ICD-10-CM | POA: Diagnosis not present

## 2020-05-04 MED ORDER — LORATADINE 10 MG PO TABS
10.0000 mg | ORAL_TABLET | Freq: Every day | ORAL | 11 refills | Status: DC
Start: 2020-05-04 — End: 2020-06-07

## 2020-05-04 MED ORDER — PANTOPRAZOLE SODIUM 40 MG PO TBEC: 40.0000 mg | DELAYED_RELEASE_TABLET | Freq: Every day | ORAL | 5 refills | Status: DC

## 2020-05-04 NOTE — Patient Instructions (Addendum)
Please restart loratadine 10 mg once daily. We will start pantoprazole 40 mg (Protonix) once daily.  Take 1 hour before food.  Take this until next visit and keep track of whether it helps your cough. Try to avoid spicy foods if possible Try to adhere to reflux avoidance recommendations Raise the head of your bed slightly and try not to eat after 8:00 PM Follow with Dr Lamonte Sakai in 1 month or next available to review how your cough is doing. We will give the Prevnar 13 pneumonia vaccine today

## 2020-05-04 NOTE — Addendum Note (Signed)
Addended by: Gavin Potters R on: 05/04/2020 04:47 PM   Modules accepted: Orders

## 2020-05-04 NOTE — Addendum Note (Signed)
Addended by: Gavin Potters R on: 05/04/2020 02:31 PM   Modules accepted: Orders

## 2020-05-04 NOTE — Progress Notes (Signed)
Subjective:    Patient ID: Patricia Mata, female    DOB: [birth year], 64 y.o.   MRN: [id number]  HPI  ROV 05/04/20 --this follow-up visit for patient with multiple medical problems including hypertension, atrial stenosis, A. fib on amiodarone and secondary pulmonary hypertension (sildenafil), chronic renal insufficiency, myelodysplastic syndrome, prior colon cancer.  She had COVID-19 in 03/2019 subsequent CAP 09/2019.  She has kept cough, purulent sputum.  May have benefited some from loratadine, fluticasone nasal spray, guaifenesin - out of the nasal steroid, stopped loratadine, mucinex.  Sputum culture from 04/14/2020 showed normal flora. The cough is worst at night, can wake her from sleep. No overt GERD sx.    Review of Systems As per HPI  Past Medical History:  Diagnosis Date  . Acute kidney injury (nontraumatic) ([place])   . Acute renal failure (ARF) ([place]) 06/08/2018  . Cancer [facility]) 2008   Colon   . Diabetes mellitus without complication ([place])   . Gout 09/08/2018  . Hypertension   . Macrocytic anemia      Family History  Problem Relation Age of Onset  . Hypertension Mother   . Diabetes Mother   . Cervical cancer Mother   . Heart attack Father   . Hypertension Sister   . Hypertension Brother   . Hypertension Sister   . Hypertension Sister   . Prostate cancer Brother   . HIV/AIDS Brother      Social History   Socioeconomic History  . Marital status: Married    Spouse name: Not on file  . Number of children: Not on file  . Years of education: Not on file  . Highest education level: Not on file  Occupational History  . Not on file  Tobacco Use  . Smoking status: Former Smoker    Packs/day: 0.25    Years: 20.00    Pack years: 5.00    Types: Cigarettes    Quit date: 01/31/2001    Years since quitting: 19.2  . Smokeless tobacco: Former Network engineer  . Vaping Use: Never used  Substance and Sexual Activity  . Alcohol use: Yes    Alcohol/week: 2.0 standard  drinks    Types: 2 Shots of liquor per week  . Drug use: Never  . Sexual activity: Not on file  Other Topics Concern  . Not on file  Social History Narrative  . Not on file   Social Determinants of Health   Financial Resource Strain: Medium Risk  . Difficulty of Paying Living Expenses: Somewhat hard  Food Insecurity:   . Worried About Charity fundraiser in the Last Year: Not on file  . Ran Out of Food in the Last Year: Not on file  Transportation Needs:   . Lack of Transportation (Medical): Not on file  . Lack of Transportation (Non-Medical): Not on file  Physical Activity:   . Days of Exercise per Week: Not on file  . Minutes of Exercise per Session: Not on file  Stress:   . Feeling of Stress : Not on file  Social Connections:   . Frequency of Communication with Friends and Family: Not on file  . Frequency of Social Gatherings with Friends and Family: Not on file  . Attends Religious Services: Not on file  . Active Member of Clubs or Organizations: Not on file  . Attends Archivist Meetings: Not on file  . Marital Status: Not on file  Intimate Partner Violence:   . Fear of  Current or Ex-Partner: Not on file  . Emotionally Abused: Not on file  . Physically Abused: Not on file  . Sexually Abused: Not on file     Allergies  Allergen Reactions  . Ancef [Cefazolin] Itching    Severe itching- after procedure, ancef was the antibiotic.-04/02/17 Tolerates penicillins  . Lactose Intolerance (Gi) Diarrhea  . Sulfa Antibiotics Rash and Other (See Comments)    Blisters, also     Outpatient Medications Prior to Visit  Medication Sig Dispense Refill  . albuterol (PROAIR HFA) 108 (90 Base) MCG/ACT inhaler Inhale 2 puffs into the lungs every 6 (six) hours as needed for wheezing or shortness of breath. 18 g 2  . allopurinol (ZYLOPRIM) 100 MG tablet Take 200 mg by mouth daily.     Marland Kitchen amiodarone (PACERONE) 200 MG tablet Take 1 tablet (200 mg total) by mouth daily. 90 tablet  1  . amLODipine (NORVASC) 10 MG tablet Take 1 tablet (10 mg total) by mouth daily. 90 tablet 3  . atorvastatin (LIPITOR) 40 MG tablet Take 1 tablet (40 mg total) by mouth daily. 90 tablet 1  . blood glucose meter kit and supplies Dispense based on patient and insurance preference. Use up to four times daily as directed. (FOR ICD-10 E10.9, E11.9). 1 each 3  . Blood Glucose Monitoring Suppl (ONETOUCH ULTRALINK) w/Device KIT Check blood sugars twice daily E11.9 1 kit 0  . cloNIDine (CATAPRES) 0.1 MG tablet Take 1 tablet (0.1 mg total) by mouth 2 (two) times daily. 60 tablet 6  . colchicine 0.6 MG tablet TAKE 1 TABLET BY MOUTH TWICE A DAY AS NEEDED FOR GOUT 180 tablet 1  . dextromethorphan-guaiFENesin (MUCINEX DM) 30-600 MG 12hr tablet Take 1 tablet by mouth 2 (two) times daily. 30 tablet 0  . doxazosin (CARDURA) 8 MG tablet Take 1 tablet (8 mg total) by mouth daily. 30 tablet 6  . fluticasone (FLONASE) 50 MCG/ACT nasal spray Place 1 spray into both nostrils daily. 16 g 2  . furosemide (LASIX) 40 MG tablet Take 1 tablet by mouth once daily 90 tablet 0  . glucose blood test strip check blood sugars twice daily Dx code E11.22 100 each 3  . hydrALAZINE (APRESOLINE) 100 MG tablet Take 1 tablet (100 mg total) by mouth 3 (three) times daily. 90 tablet 3  . Insulin Glargine (BASAGLAR KWIKPEN) 100 UNIT/ML Inject 0.14 mLs (14 Units total) into the skin daily. (Patient taking differently: Inject 16 Units into the skin daily. ) 5 pen 1  . Insulin Pen Needle 29G X 5MM MISC Use as directed 200 each 0  . Lancets (ONETOUCH DELICA PLUS [id number]) MISC check blood sugars twice daily Dx code: E11.22 100 each 3  . latanoprost (XALATAN) 0.005 % ophthalmic solution Place 1 drop into both eyes at bedtime.     Marland Kitchen levothyroxine (SYNTHROID) 112 MCG tablet Take 1 tablet (112 mcg total) by mouth daily. 30 tablet 11  . linagliptin (TRADJENTA) 5 MG TABS tablet Take 1 tablet (5 mg total) by mouth daily. 90 tablet 1  . loratadine  (CLARITIN) 10 MG tablet Take 1 tablet (10 mg total) by mouth daily. 30 tablet 11  . Multiple Vitamins-Minerals (CENTRUM SILVER 50+WOMEN) TABS Take 1 tablet by mouth daily.    Marland Kitchen oxymetazoline (AFRIN) 0.05 % nasal spray Place 1 spray into both nostrils 2 (two) times daily as needed for congestion.    . sildenafil (REVATIO) 20 MG tablet TAKE 1 TABLET BY MOUTH THREE TIMES DAILY 90 tablet 0  No facility-administered medications prior to visit.         Objective:   Physical Exam Vitals:   05/04/20 1344  BP: (!) 164/74  Pulse: 69  Temp: 98.3 F (36.8 C)  TempSrc: Temporal  SpO2: 98%  Weight: 213 lb 6.4 oz (96.8 kg)  Height: '5\' 4"'  (1.626 m)   Gen: Pleasant, obese woman, in no distress,  normal affect  ENT: No lesions,  mouth clear,  oropharynx clear, no postnasal drip, strong voice  Neck: No JVD, no stridor  Lungs: No use of accessory muscles, bilateral inspiratory squeaks, no crackles or wheezes  Cardiovascular: RRR, blowing holosystolic M, no peripheral edema  Musculoskeletal: No deformities, no cyanosis or clubbing  Neuro: alert, awake, non focal  Skin: Warm, no lesions or rash      Assessment & Plan:  Chronic cough Please restart loratadine 10 mg once daily. We will start pantoprazole 40 mg (Protonix) once daily.  Take 1 hour before food.  Take this until next visit and keep track of whether it helps your cough. Try to avoid spicy foods if possible Try to adhere to reflux avoidance recommendations Raise the head of your bed slightly and try not to eat after 8:00 PM Follow with Dr Lamonte Sakai in 1 month or next available to review how your cough is doing.  Baltazar Apo, MD, PhD 05/04/2020, 2:17 PM [facility] [phone] or if no answer [phone]

## 2020-05-04 NOTE — Assessment & Plan Note (Signed)
Please restart loratadine 10 mg once daily. We will start pantoprazole 40 mg (Protonix) once daily.  Take 1 hour before food.  Take this until next visit and keep track of whether it helps your cough. Try to avoid spicy foods if possible Try to adhere to reflux avoidance recommendations Raise the head of your bed slightly and try not to eat after 8:00 PM Follow with Dr Lamonte Sakai in 1 month or next available to review how your cough is doing.

## 2020-05-05 ENCOUNTER — Other Ambulatory Visit: Payer: Self-pay | Admitting: Nurse Practitioner

## 2020-05-05 DIAGNOSIS — E039 Hypothyroidism, unspecified: Secondary | ICD-10-CM

## 2020-05-05 MED ORDER — LEVOTHYROXINE SODIUM 125 MCG PO TABS: 125.0000 ug | ORAL_TABLET | Freq: Every day | ORAL | 6 refills | Status: DC

## 2020-05-11 ENCOUNTER — Ambulatory Visit (INDEPENDENT_AMBULATORY_CARE_PROVIDER_SITE_OTHER): Payer: Medicare Other | Admitting: Nurse Practitioner

## 2020-05-11 ENCOUNTER — Encounter: Payer: Self-pay | Admitting: Nurse Practitioner

## 2020-05-11 ENCOUNTER — Other Ambulatory Visit (HOSPITAL_COMMUNITY)
Admission: RE | Admit: 2020-05-11 | Discharge: 2020-05-11 | Disposition: A | Discharge status: Home / Self Care | Payer: Medicare Other | Source: Ambulatory Visit | Attending: Nurse Practitioner | Admitting: Nurse Practitioner

## 2020-05-11 ENCOUNTER — Other Ambulatory Visit: Payer: Self-pay

## 2020-05-11 VITALS — BP 142/70 | HR 61 | Temp 97.9°F | Ht 66.2 in | Wt 214.4 lb

## 2020-05-11 DIAGNOSIS — N765 Ulceration of vagina: Secondary | ICD-10-CM

## 2020-05-11 DIAGNOSIS — R319 Hematuria, unspecified: Secondary | ICD-10-CM

## 2020-05-11 DIAGNOSIS — E1122 Type 2 diabetes mellitus with diabetic chronic kidney disease: Secondary | ICD-10-CM

## 2020-05-11 DIAGNOSIS — Z23 Encounter for immunization: Secondary | ICD-10-CM

## 2020-05-11 DIAGNOSIS — I10 Essential (primary) hypertension: Secondary | ICD-10-CM

## 2020-05-11 DIAGNOSIS — N939 Abnormal uterine and vaginal bleeding, unspecified: Secondary | ICD-10-CM

## 2020-05-11 DIAGNOSIS — N183 Chronic kidney disease, stage 3 unspecified: Secondary | ICD-10-CM

## 2020-05-11 DIAGNOSIS — E7800 Pure hypercholesterolemia, unspecified: Secondary | ICD-10-CM

## 2020-05-11 DIAGNOSIS — Z113 Encounter for screening for infections with a predominantly sexual mode of transmission: Secondary | ICD-10-CM

## 2020-05-11 DIAGNOSIS — Z7689 Persons encountering health services in other specified circumstances: Secondary | ICD-10-CM | POA: Diagnosis present

## 2020-05-11 DIAGNOSIS — Z794 Long term (current) use of insulin: Secondary | ICD-10-CM

## 2020-05-11 LAB — POCT URINALYSIS DIPSTICK
Bilirubin, UA: NEGATIVE
Blood, UA: NEGATIVE
Glucose, UA: NEGATIVE
Ketones, UA: NEGATIVE
Leukocytes, UA: NEGATIVE
Nitrite, UA: NEGATIVE
Protein, UA: POSITIVE — AB
Spec Grav, UA: 1.015 (ref 1.010–1.025)
Urobilinogen, UA: 0.2 E.U./dL
pH, UA: 6 (ref 5.0–8.0)

## 2020-05-11 MED ORDER — SHINGRIX 50 MCG/0.5ML IM SUSR: 0.5000 mL | Freq: Once | INTRAMUSCULAR | 0 refills | Status: AC

## 2020-05-11 MED ORDER — VALACYCLOVIR HCL 500 MG PO TABS: ORAL_TABLET | ORAL | 0 refills | Status: DC

## 2020-05-11 NOTE — Progress Notes (Signed)
This visit occurred during the SARS-CoV-2 public health emergency.  Safety protocols were in place, including screening questions prior to the visit, additional usage of staff PPE, and extensive cleaning of exam room while observing appropriate contact time as indicated for disinfecting solutions.  Subjective:     Patient ID: Patricia Mata , female    DOB: 05-29-1956 , 64 y.o.   MRN: [id number]   Chief Complaint  Patient presents with  . Diabetes  . break through bleeding    HPI  Patient here for a f/u on her DM.   She is having vaginal spotting intermittently over the last month.  She is seeing pink on her tissue.  She has a sore area on her vaginal area.  She reports her mother had cancer of the uterus.   Diabetes She presents for her follow-up diabetic visit. She has type 2 diabetes mellitus. Pertinent negatives for hypoglycemia include no dizziness or headaches. Pertinent negatives for diabetes include no chest pain, no fatigue, no polydipsia, no polyphagia and no polyuria. There are no hypoglycemic complications. There are no diabetic complications. Current diabetic treatment includes oral agent (dual therapy) and insulin injections. She is compliant with treatment all of the time. She is following a generally healthy diet. There is no change in her home blood glucose trend. (She feels like her blood sugars are not too bad. )     Past Medical History:  Diagnosis Date  . Acute kidney injury (nontraumatic) ([place])   . Acute renal failure (ARF) ([place]) 06/08/2018  . Cancer [facility]) 2008   Colon   . Diabetes mellitus without complication ([place])   . Gout 09/08/2018  . Hypertension   . Macrocytic anemia      Family History  Problem Relation Age of Onset  . Hypertension Mother   . Diabetes Mother   . Cervical cancer Mother   . Heart attack Father   . Hypertension Sister   . Hypertension Brother   . Hypertension Sister   . Hypertension Sister   . Prostate cancer Brother   .  HIV/AIDS Brother      Current Outpatient Medications:  .  albuterol (PROAIR HFA) 108 (90 Base) MCG/ACT inhaler, Inhale 2 puffs into the lungs every 6 (six) hours as needed for wheezing or shortness of breath., Disp: 18 g, Rfl: 2 .  allopurinol (ZYLOPRIM) 100 MG tablet, Take 300 mg by mouth daily. , Disp: , Rfl:  .  amiodarone (PACERONE) 200 MG tablet, Take 1 tablet (200 mg total) by mouth daily., Disp: 90 tablet, Rfl: 1 .  amLODipine (NORVASC) 10 MG tablet, Take 1 tablet (10 mg total) by mouth daily., Disp: 90 tablet, Rfl: 3 .  atorvastatin (LIPITOR) 40 MG tablet, Take 1 tablet (40 mg total) by mouth daily., Disp: 90 tablet, Rfl: 1 .  blood glucose meter kit and supplies, Dispense based on patient and insurance preference. Use up to four times daily as directed. (FOR ICD-10 E10.9, E11.9)., Disp: 1 each, Rfl: 3 .  Blood Glucose Monitoring Suppl (ONETOUCH ULTRALINK) w/Device KIT, Check blood sugars twice daily E11.9, Disp: 1 kit, Rfl: 0 .  cloNIDine (CATAPRES) 0.1 MG tablet, Take 1 tablet (0.1 mg total) by mouth 2 (two) times daily., Disp: 60 tablet, Rfl: 6 .  colchicine 0.6 MG tablet, TAKE 1 TABLET BY MOUTH TWICE A DAY AS NEEDED FOR GOUT, Disp: 180 tablet, Rfl: 1 .  dextromethorphan-guaiFENesin (MUCINEX DM) 30-600 MG 12hr tablet, Take 1 tablet by mouth 2 (two) times daily., Disp:  30 tablet, Rfl: 0 .  doxazosin (CARDURA) 8 MG tablet, Take 1 tablet (8 mg total) by mouth daily., Disp: 30 tablet, Rfl: 6 .  fluticasone (FLONASE) 50 MCG/ACT nasal spray, Place 1 spray into both nostrils daily., Disp: 16 g, Rfl: 2 .  furosemide (LASIX) 40 MG tablet, Take 1 tablet by mouth once daily, Disp: 90 tablet, Rfl: 0 .  glucose blood test strip, check blood sugars twice daily Dx code E11.22, Disp: 100 each, Rfl: 3 .  hydrALAZINE (APRESOLINE) 100 MG tablet, Take 1 tablet (100 mg total) by mouth 3 (three) times daily., Disp: 90 tablet, Rfl: 3 .  Insulin Glargine (BASAGLAR KWIKPEN) 100 UNIT/ML, Inject 0.14 mLs (14  Units total) into the skin daily. (Patient taking differently: Inject 16 Units into the skin daily. ), Disp: 5 pen, Rfl: 1 .  Insulin Pen Needle 29G X 5MM MISC, Use as directed, Disp: 200 each, Rfl: 0 .  Lancets (ONETOUCH DELICA PLUS TMHDQQ22L) MISC, check blood sugars twice daily Dx code: E11.22, Disp: 100 each, Rfl: 3 .  latanoprost (XALATAN) 0.005 % ophthalmic solution, Place 1 drop into both eyes at bedtime. , Disp: , Rfl:  .  levothyroxine (SYNTHROID) 125 MCG tablet, Take 1 tablet (125 mcg total) by mouth daily., Disp: 30 tablet, Rfl: 6 .  linagliptin (TRADJENTA) 5 MG TABS tablet, Take 1 tablet (5 mg total) by mouth daily., Disp: 90 tablet, Rfl: 1 .  loratadine (CLARITIN) 10 MG tablet, Take 1 tablet (10 mg total) by mouth daily., Disp: 30 tablet, Rfl: 11 .  Multiple Vitamins-Minerals (CENTRUM SILVER 50+WOMEN) TABS, Take 1 tablet by mouth daily., Disp: , Rfl:  .  oxymetazoline (AFRIN) 0.05 % nasal spray, Place 1 spray into both nostrils 2 (two) times daily as needed for congestion., Disp: , Rfl:  .  pantoprazole (PROTONIX) 40 MG tablet, Take 1 tablet (40 mg total) by mouth daily., Disp: 30 tablet, Rfl: 5 .  sildenafil (REVATIO) 20 MG tablet, TAKE 1 TABLET BY MOUTH THREE TIMES DAILY, Disp: 90 tablet, Rfl: 0 .  valACYclovir (VALTREX) 500 MG tablet, Take 1 tablet by mouth two times a day, Disp: 20 tablet, Rfl: 0   Allergies  Allergen Reactions  . Ancef [Cefazolin] Itching    Severe itching- after procedure, ancef was the antibiotic.-04/02/17 Tolerates penicillins  . Lactose Intolerance (Gi) Diarrhea  . Sulfa Antibiotics Rash and Other (See Comments)    Blisters, also     Review of Systems  Constitutional: Negative for fatigue.  Respiratory: Negative.   Cardiovascular: Negative for chest pain, palpitations and leg swelling.  Endocrine: Negative for polydipsia, polyphagia and polyuria.  Neurological: Negative for dizziness and headaches.  Psychiatric/Behavioral: Negative.      Today's  Vitals   05/11/20 1132  BP: (!) 142/70  Pulse: 61  Temp: 97.9 F (36.6 C)  TempSrc: Oral  Weight: 214 lb 6.4 oz (97.3 kg)  Height: 5' 6.2" (1.681 m)  PainSc: 0-No pain   Body mass index is 34.4 kg/m.   Objective:  Physical Exam Vitals reviewed.  Constitutional:      General: She is not in acute distress.    Appearance: Normal appearance. She is obese.  Cardiovascular:     Rate and Rhythm: Normal rate and regular rhythm.     Pulses: Normal pulses.     Heart sounds: Murmur heard.   Pulmonary:     Effort: Pulmonary effort is normal. No respiratory distress.     Breath sounds: Normal breath sounds. No wheezing.  Genitourinary:    Labia:        Right: No rash, tenderness, lesion or injury.        Left: Tenderness and lesion present. No rash or injury.   Neurological:     General: No focal deficit present.     Mental Status: She is alert and oriented to person, place, and time.     Cranial Nerves: No cranial nerve deficit.  Psychiatric:        Mood and Affect: Mood normal.        Behavior: Behavior normal.        Thought Content: Thought content normal.        Judgment: Judgment normal.         Assessment And Plan:     1. Type 2 diabetes mellitus with stage 3 chronic kidney disease, with long-term current use of insulin, unspecified whether stage 3a or 3b CKD (HCC)  Chronic, controlled  Continue with current medications, she is tolerating well  Encouraged to limit intake of sugary foods and drinks  Encouraged to increase physical activity to 150 minutes per week - Lipid panel - Hemoglobin A1c - CMP14+EGFR  2. Hematuria, unspecified type  - POCT Urinalysis Dipstick (81002)  3. Vaginal ulceration  Left labia majora with ulcerated area   Swab done to check for HSV  Will treat with valtrex empirically - valACYclovir (VALTREX) 500 MG tablet; Take 1 tablet by mouth two times a day  Dispense: 20 tablet; Refill: 0 - Herpes Culture  4. Encounter for  immunization  She is to get this done in a few weeks after the completion of the valtrex - Zoster Vaccine Adjuvanted [facility]) injection; Inject 0.5 mLs into the muscle once for 1 dose.  Dispense: 0.5 mL; Refill: 0  5. Essential hypertension  Chronic, fair control  Continue with current medications  Continue follow up with Cardiology - CMP14+EGFR  6. Pure hypercholesterolemia  Chronic, stable  Continue with current medications - Lipid panel - CMP14+EGFR  7. Abnormal vaginal bleeding  Due to being postmenopausal will order a transvaginal ultrasound with pelvic ultrasound. I have advised her when this occurs she may need to see a GYN for further evaluation but will start with the ultrasound - US Pelvic Complete With Transvaginal; Future  8. Screening for STD (sexually transmitted disease) - HIV Antibody (routine testing w rflx) - T pallidum Screening [place] - Cervicovaginal ancillary only     Patient was given opportunity to ask questions. Patient verbalized understanding of the plan and was able to repeat key elements of the plan. All questions were answered to their satisfaction.    Teola Bradley, FNP, have reviewed all documentation for this visit. The documentation on 05/14/20 for the exam, diagnosis, procedures, and orders are all accurate and complete.  THE PATIENT IS ENCOURAGED TO PRACTICE SOCIAL DISTANCING DUE TO THE COVID-19 PANDEMIC.

## 2020-05-11 NOTE — Patient Instructions (Signed)

## 2020-05-11 NOTE — Chronic Care Management (AMB) (Signed)
Chronic Care Management Pharmacy Assistant   Name: Anokhi Shannon  MRN: [id number] DOB: [birth year]  Reason for Encounter: Medication Review/ Thyroid Follow up  PCP : [name], [place]   04/27/2020- Telephone follow up visit with Jannette Fogo, Pharm D on  04/14/2020 discussed abnormal thyroid levels. Patient is currently taking Levothyroxine 112 mcg, TSH levels to be rechecked 6 weeks after around 04/26/2020 to discuss adjusting levothyroxine dose with PCP. Pharmacist would like for me to monitor follow on thyroid levels and assist with medication adherence if needed.  04/28/2020- Followed up with PCP to inquire if patient will return for labs, informed patient scheduled for 05/01/2020 to have TSH levels rechecked.  05/01/2020- Thyroid levels rechecked per PCP, still underactive, patient will need to increase Levothyroxine to 125 mcg and return in 4 weeks to recheck.   Patient notified and new prescription of Levothyroxine 125 mcg sent to [facility]. Will follow up next month to monitor thyroid levels and medication adherence.   Allergies:   Allergies  Allergen Reactions   Ancef [Cefazolin] Itching    Severe itching- after procedure, ancef was the antibiotic.-04/02/17 Tolerates penicillins   Lactose Intolerance (Gi) Diarrhea   Sulfa Antibiotics Rash and Other (See Comments)    Blisters, also    Medications: Outpatient Encounter Medications as of 04/27/2020  Medication Sig   albuterol (PROAIR HFA) 108 (90 Base) MCG/ACT inhaler Inhale 2 puffs into the lungs every 6 (six) hours as needed for wheezing or shortness of breath.   allopurinol (ZYLOPRIM) 100 MG tablet Take 300 mg by mouth daily.    amiodarone (PACERONE) 200 MG tablet Take 1 tablet (200 mg total) by mouth daily.   amLODipine (NORVASC) 10 MG tablet Take 1 tablet (10 mg total) by mouth daily.   atorvastatin (LIPITOR) 40 MG tablet Take 1 tablet (40 mg total) by mouth daily.   blood glucose meter kit  and supplies Dispense based on patient and insurance preference. Use up to four times daily as directed. (FOR ICD-10 E10.9, E11.9).   Blood Glucose Monitoring Suppl (ONETOUCH ULTRALINK) w/Device KIT Check blood sugars twice daily E11.9   cloNIDine (CATAPRES) 0.1 MG tablet Take 1 tablet (0.1 mg total) by mouth 2 (two) times daily.   colchicine 0.6 MG tablet TAKE 1 TABLET BY MOUTH TWICE A DAY AS NEEDED FOR GOUT   dextromethorphan-guaiFENesin (MUCINEX DM) 30-600 MG 12hr tablet Take 1 tablet by mouth 2 (two) times daily.   doxazosin (CARDURA) 8 MG tablet Take 1 tablet (8 mg total) by mouth daily.   fluticasone (FLONASE) 50 MCG/ACT nasal spray Place 1 spray into both nostrils daily.   furosemide (LASIX) 40 MG tablet Take 1 tablet by mouth once daily   glucose blood test strip check blood sugars twice daily Dx code E11.22   hydrALAZINE (APRESOLINE) 100 MG tablet Take 1 tablet (100 mg total) by mouth 3 (three) times daily.   Insulin Glargine (BASAGLAR KWIKPEN) 100 UNIT/ML Inject 0.14 mLs (14 Units total) into the skin daily. (Patient taking differently: Inject 16 Units into the skin daily. )   Insulin Pen Needle 29G X 5MM MISC Use as directed   Lancets (ONETOUCH DELICA PLUS [id number]) MISC check blood sugars twice daily Dx code: E11.22   latanoprost (XALATAN) 0.005 % ophthalmic solution Place 1 drop into both eyes at bedtime.    linagliptin (TRADJENTA) 5 MG TABS tablet Take 1 tablet (5 mg total) by mouth daily.   Multiple Vitamins-Minerals (CENTRUM SILVER 50+WOMEN) TABS Take 1 tablet by  mouth daily.   oxymetazoline (AFRIN) 0.05 % nasal spray Place 1 spray into both nostrils 2 (two) times daily as needed for congestion.   sildenafil (REVATIO) 20 MG tablet TAKE 1 TABLET BY MOUTH THREE TIMES DAILY   [DISCONTINUED] levothyroxine (SYNTHROID) 112 MCG tablet Take 1 tablet (112 mcg total) by mouth daily.   [DISCONTINUED] loratadine (CLARITIN) 10 MG tablet Take 1 tablet (10 mg total) by mouth  daily.   No facility-administered encounter medications on file as of 04/27/2020.    Current Diagnosis: Patient Active Problem List   Diagnosis Date Noted   Chronic cough 05/04/2020   Allergic rhinitis 03/23/2020   Chronic kidney disease, stage 5 ([place]) 02/03/2020   Purulent bronchitis ([place]) 28/76/8115   Acute diastolic CHF (congestive heart failure) ([place]) 09/21/2019   ARDS (adult respiratory distress syndrome) (HCC)    Acute respiratory failure with hypoxia ([place]) 09/20/2019   HCAP (healthcare-associated pneumonia) 09/20/2019   Pulmonary hypertension ([place]) 09/20/2019   CKD (chronic kidney disease), stage III ([place]) 09/20/2019   DM2 (diabetes mellitus, type 2) ([place]) 09/20/2019   Elevated troponin 09/20/2019   Severe sepsis ([place]) 09/20/2019   CAP (community acquired pneumonia) 09/14/2019   Hypoxia 09/14/2019   Neutropenia ([place]) 05/25/2019   Malignant neoplasm of colon ([place]) 05/25/2019   Bradycardia 03/21/2019   Pneumonia due to COVID-19 virus 03/18/2019   Type 2 diabetes mellitus with hemoglobin A1c goal of less than 7.0% ([place]) 12/10/2018   Hypothyroidism 12/10/2018   Seasonal allergies 12/10/2018   Gout 09/08/2018   Bacterial infection due to Morganella morganii    Sacral wound 06/08/2018   Iron deficiency anemia due to chronic blood loss 06/08/2018   Hyponatremia 06/08/2018   Constipation 06/08/2018   Encounter for nasogastric (NG) tube placement    Pressure injury of skin 05/22/2018   Acute hypoxemic respiratory failure (HCC)    Cardiogenic shock (HCC)    Atrial fibrillation, transient ([place]) 05/11/2018   Normocytic anemia 06/14/2017   Severe aortic stenosis 06/10/2017   Carotid artery disease ([place]) 06/10/2017   Essential hypertension 05/13/2017   Hyperlipidemia 05/13/2017   Bilateral lower extremity edema 05/13/2017   Port-A-Cath in place 04/28/2017   MDS (myelodysplastic syndrome) ([place]) 03/20/2017   Goals of care,  counseling/discussion 03/20/2017   Anemia in chronic kidney disease 05/10/2016   Anemia in stage 1 chronic kidney disease 05/10/2016   Anemia due to chronic kidney disease 02/09/2016    Goals Addressed            This [place]   On track    CARE PLAN ENTRY (see longitudinal plan of care for additional care plan information)  Current Barriers:   Chronic Disease Management support, education, and care coordination needs related to Hypertension, Hyperlipidemia, Diabetes, and Hypothyroidism   Hypertension BP Readings from Last 3 Encounters:  03/29/20 (!) 170/58  03/28/20 (!) 154/60  03/23/20 140/60    Pharmacist Clinical Goal(s): o Over the next 90 days, patient will work with PharmD and providers to achieve BP goal <130/80  Current regimen:  o Amlodipine 45m daily o Clonidine 0.131mtwice daily o Doxazosin 44m58maily o Furosemide 13m17mily o Hydralazine 100mg61mee times daily o Sildenafil 20mg 67me times daily  Interventions: o Provided dietary and exercise recommendations o Reviewed medications changes by [facility]; discussed with patient  Patient self care activities - Over the next 90 days, patient will: o Check BP daily, document, and provide at future appointments o Ensure daily salt  intake < 2300 mg/day o Increase exercise with goal of at least 150 minutes weekly (more if tolerated) o Use alternative seasoning to salt (Garlic, pepper, etc)  Hyperlipidemia Lab Results  Component Value Date/Time   LDLCALC 86 03/20/2019 04:06 AM    Pharmacist Clinical Goal(s): o Over the next 90 days, patient will work with PharmD and providers to achieve LDL goal < 70  Current regimen:  o Atorvastatin 103m daily  Interventions: o Provided dietary and exercise recommendations o Discussed appropriate goals for LDL (less than 70) and HDL (greater than 50)  Patient self care activities - Over the next 90 days, patient  will: o Increase exercise with goal of at least 150 minutes weekly (more if tolerated) o Increase intake of healthy fats in diet (avocados, walnuts, fish, flaxseed, etc)  Diabetes Lab Results  Component Value Date/Time   HGBA1C 6.2 (H) 02/03/2020 12:31 PM   HGBA1C 6.1 (H) 12/07/2019 12:35 PM    Pharmacist Clinical Goal(s): o Over the next 180 days, patient will work with PharmD and providers to maintain A1c goal <7%  Current regimen:  o Basaglar 16 units daily o Tradjenta 549mdaily  Interventions: o Provided dietary and exercise recommendations o Discussed appropriate goal for fasting blood sugar (80-130) o Hypoglycemia (blood sugar less than 70) and treatment o Patient was approved for TrPrunedaleatient assistance program through [place] Tradjenta [place] the mail - Continue to assist with patient assistance as needed o Determined [place] [facility] [place] [facility]  Patient self care activities - Over the next 180 days, patient will: o Check blood sugar once daily, document, and provide at future appointments o Contact provider with any episodes of hypoglycemia o Increase exercise with goal of at least 150 minutes weekly (more if tolerated) o Increase water intake to 64 ounces daily o Utilize PLATE method for meal planning (provided education)  Hypothyroidism  Pharmacist Clinical Goal(s) o Over the next 90 days, patient will work with PharmD and providers to achieve thyroid hormone levels within normal limits  Current regimen:  o Levothyroxine 11229m Interventions: o Discussed recent TSH level (still elevated) and plan to recheck after 6 weeks of levothyroxine 112m79m Collaborated with PCP regarding scheduling patient for TSH recheck around 10/13 and then adjust levothyroxine dosing o Discussed possibility of switching to name brand Synthroid to decrease variability  Patient self care activities - Over the next 90  days, patient will: o Continue levothyroxine 112mc37mily as directed o Obtain labwork when scheduled  Medication management  Pharmacist Clinical Goal(s): o Over the next 180 days, patient will work with PharmD and providers to maintain optimal medication adherence  Current pharmacy: [facility]  Interventions o Comprehensive medication review performed. o Continue current medication management strategy o Discussed vaccination and schedule  Patient self care activities - Over the next 180 days, patient will: o Focus on medication adherence by continued use of pill box o Take medications as prescribed o Report any questions or concerns to PharmD and/or provider(s)  Please see past updates related to this goal by clicking on the "Past Updates" button in the selected goal         Follow-Up:  Pharmacist Review  Patient on track with Hypothyroidism adherence. Will follow up next month. ChrisLeata Mouse notified.  TamalPattricia Boss CGreen Rivermacist Assistant [phone]

## 2020-05-12 LAB — CMP14+EGFR
ALT: 15 IU/L (ref 0–32)
AST: 25 IU/L (ref 0–40)
Albumin/Globulin Ratio: 1.4 (ref 1.2–2.2)
Albumin: 4.4 g/dL (ref 3.8–4.8)
Alkaline Phosphatase: 95 IU/L (ref 44–121)
BUN/Creatinine Ratio: 22 (ref 12–28)
BUN: 65 mg/dL — ABNORMAL HIGH (ref 8–27)
Bilirubin Total: 1 mg/dL (ref 0.0–1.2)
CO2: 18 mmol/L — ABNORMAL LOW (ref 20–29)
Calcium: 9.1 mg/dL (ref 8.7–10.3)
Chloride: 100 mmol/L (ref 96–106)
Creatinine, Ser: 2.91 mg/dL — ABNORMAL HIGH (ref 0.57–1.00)
GFR calc Af Amer: 19 mL/min/{1.73_m2} — ABNORMAL LOW (ref >59)
GFR calc non Af Amer: 16 mL/min/{1.73_m2} — ABNORMAL LOW (ref >59)
Globulin, Total: 3.2 g/dL (ref 1.5–4.5)
Glucose: 112 mg/dL — ABNORMAL HIGH (ref 65–99)
Potassium: 4.9 mmol/L (ref 3.5–5.2)
Sodium: 134 mmol/L (ref 134–144)
Total Protein: 7.6 g/dL (ref 6.0–8.5)

## 2020-05-12 LAB — HIV ANTIBODY (ROUTINE TESTING W REFLEX): HIV Screen 4th Generation wRfx: NONREACTIVE

## 2020-05-12 LAB — LIPID PANEL
Chol/HDL Ratio: 2.1 ratio (ref 0.0–4.4)
Cholesterol, Total: 174 mg/dL (ref 100–199)
HDL: 84 mg/dL (ref >39)
LDL Chol Calc (NIH): 79 mg/dL (ref 0–99)
Triglycerides: 58 mg/dL (ref 0–149)
VLDL Cholesterol Cal: 11 mg/dL (ref 5–40)

## 2020-05-12 LAB — T PALLIDUM SCREENING CASCADE: T pallidum Antibodies (TP-PA): NONREACTIVE

## 2020-05-12 LAB — HEMOGLOBIN A1C
Est. average glucose Bld gHb Est-mCnc: 126 mg/dL
Hgb A1c MFr Bld: 6 % — ABNORMAL HIGH (ref 4.8–5.6)

## 2020-05-14 LAB — HERPES SIMPLEX VIRUS CULTURE

## 2020-05-15 LAB — CERVICOVAGINAL ANCILLARY ONLY
Bacterial Vaginitis (gardnerella): NEGATIVE
Candida Glabrata: NEGATIVE
Candida Vaginitis: NEGATIVE
Chlamydia: NEGATIVE
Comment: NEGATIVE
Comment: NEGATIVE
Comment: NEGATIVE
Comment: NEGATIVE
Comment: NEGATIVE
Comment: NORMAL
Neisseria Gonorrhea: NEGATIVE
Trichomonas: NEGATIVE

## 2020-05-17 ENCOUNTER — Other Ambulatory Visit: Payer: Self-pay | Admitting: Oncology

## 2020-05-17 DIAGNOSIS — D649 Anemia, unspecified: Secondary | ICD-10-CM

## 2020-05-17 DIAGNOSIS — D469 Myelodysplastic syndrome, unspecified: Secondary | ICD-10-CM

## 2020-05-18 ENCOUNTER — Other Ambulatory Visit: Payer: Self-pay

## 2020-05-18 ENCOUNTER — Inpatient Hospital Stay: Payer: Medicare Other | Attending: Oncology

## 2020-05-18 ENCOUNTER — Inpatient Hospital Stay: Payer: Medicare Other

## 2020-05-18 ENCOUNTER — Inpatient Hospital Stay (HOSPITAL_BASED_OUTPATIENT_CLINIC_OR_DEPARTMENT_OTHER): Payer: Medicare Other | Admitting: Oncology

## 2020-05-18 VITALS — BP 137/56 | HR 80 | Temp 96.4°F | Resp 18 | Ht 66.2 in | Wt 212.0 lb

## 2020-05-18 DIAGNOSIS — D649 Anemia, unspecified: Secondary | ICD-10-CM

## 2020-05-18 DIAGNOSIS — D469 Myelodysplastic syndrome, unspecified: Secondary | ICD-10-CM

## 2020-05-18 LAB — CMP (CANCER CENTER ONLY)
ALT: 14 U/L (ref 0–44)
AST: 21 U/L (ref 15–41)
Albumin: 3.7 g/dL (ref 3.5–5.0)
Alkaline Phosphatase: 78 U/L (ref 38–126)
Anion gap: 12 (ref 5–15)
BUN: 77 mg/dL — ABNORMAL HIGH (ref 8–23)
CO2: 19 mmol/L — ABNORMAL LOW (ref 22–32)
Calcium: 9.3 mg/dL (ref 8.9–10.3)
Chloride: 106 mmol/L (ref 98–111)
Creatinine: 2.78 mg/dL — ABNORMAL HIGH (ref 0.44–1.00)
GFR, Estimated: 18 mL/min — ABNORMAL LOW (ref >60)
Glucose, Bld: 98 mg/dL (ref 70–99)
Potassium: 4.3 mmol/L (ref 3.5–5.1)
Sodium: 137 mmol/L (ref 135–145)
Total Bilirubin: 0.8 mg/dL (ref 0.3–1.2)
Total Protein: 7.1 g/dL (ref 6.5–8.1)

## 2020-05-18 LAB — CBC WITH DIFFERENTIAL (CANCER CENTER ONLY)
Abs Immature Granulocytes: 0.07 10*3/uL (ref 0.00–0.07)
Basophils Absolute: 0 10*3/uL (ref 0.0–0.1)
Basophils Relative: 0 %
Eosinophils Absolute: 0 10*3/uL (ref 0.0–0.5)
Eosinophils Relative: 1 %
HCT: 21.5 % — ABNORMAL LOW (ref 36.0–46.0)
Hemoglobin: 7.4 g/dL — ABNORMAL LOW (ref 12.0–15.0)
Immature Granulocytes: 1 %
Lymphocytes Relative: 5 %
Lymphs Abs: 0.2 10*3/uL — ABNORMAL LOW (ref 0.7–4.0)
MCH: 30.3 pg (ref 26.0–34.0)
MCHC: 34.4 g/dL (ref 30.0–36.0)
MCV: 88.1 fL (ref 80.0–100.0)
Monocytes Absolute: 0.6 10*3/uL (ref 0.1–1.0)
Monocytes Relative: 12 %
Neutro Abs: 4.1 10*3/uL (ref 1.7–7.7)
Neutrophils Relative %: 81 %
Platelet Count: 172 10*3/uL (ref 150–400)
RBC: 2.44 MIL/uL — ABNORMAL LOW (ref 3.87–5.11)
RDW: 22.6 % — ABNORMAL HIGH (ref 11.5–15.5)
WBC Count: 5.2 10*3/uL (ref 4.0–10.5)
nRBC: 1.2 % — ABNORMAL HIGH (ref 0.0–0.2)

## 2020-05-18 LAB — SAMPLE TO BLOOD BANK

## 2020-05-18 LAB — PREPARE RBC (CROSSMATCH)

## 2020-05-18 MED ORDER — FUROSEMIDE 10 MG/ML IJ SOLN
20.0000 mg | Freq: Once | INTRAMUSCULAR | Status: AC
  Administered 2020-05-18: 20 mg via INTRAVENOUS

## 2020-05-18 MED ORDER — DIPHENHYDRAMINE HCL 25 MG PO CAPS
ORAL_CAPSULE | ORAL | Status: AC
  Filled 2020-05-18: qty 1

## 2020-05-18 MED ORDER — DIPHENHYDRAMINE HCL 25 MG PO CAPS
25.0000 mg | ORAL_CAPSULE | Freq: Once | ORAL | Status: AC
  Administered 2020-05-18: 25 mg via ORAL

## 2020-05-18 MED ORDER — ACETAMINOPHEN 325 MG PO TABS
650.0000 mg | ORAL_TABLET | Freq: Once | ORAL | Status: AC
  Administered 2020-05-18: 650 mg via ORAL

## 2020-05-18 MED ORDER — ACETAMINOPHEN 325 MG PO TABS
ORAL_TABLET | ORAL | Status: AC
  Filled 2020-05-18: qty 2

## 2020-05-18 MED ORDER — FUROSEMIDE 10 MG/ML IJ SOLN
INTRAMUSCULAR | Status: AC
  Filled 2020-05-18: qty 4

## 2020-05-18 MED ORDER — SODIUM CHLORIDE 0.9% IV SOLUTION
250.0000 mL | Freq: Once | INTRAVENOUS | Status: AC
  Administered 2020-05-18: 250 mL via INTRAVENOUS
  Filled 2020-05-18: qty 250

## 2020-05-18 NOTE — Patient Instructions (Signed)

## 2020-05-18 NOTE — Addendum Note (Signed)
Addended by: Kennedy Bucker on: 05/18/2020 09:07 AM   Modules accepted: Orders

## 2020-05-18 NOTE — Progress Notes (Signed)
Hematology and Oncology Follow Up Visit  Patricia Mata [id number] 11-25-1955 64 y.o. 05/18/2020 8:11 AM   Principle Diagnosis: 64 year old woman with MDS presented with IPSS-R score of 2.5 and symptomatic anemia in 2017.  Secondary diagnosis: Colon cancer diagnosed in 2008.  She was found to have stage III disease without any evidence of recurrence.    Prior Therapy: She is S/P hemicolectomy done in 11/2006. 1/13 lymph nodes are positive.  This was followed by 12 cycles of FOLFOX completed in 05/2007  Aranesp 300 mcg every 2 to 3 weeks therapy was ineffective to eliminate transfusion needs.  5-azacytidine at 75 mg/m square subcutaneously started in October 2018.  She is status post 7 cycles of therapy completed in April 2019.   Current therapy:   Reblozyl 75 mg subcutaneous injection every 3 weeks started in December 2020.    Supportive affect red cell transfusion as needed.   Interim History:  Patricia Mata presents today for a follow-up visit.  Since the last visit, she reports no major changes in her health.  She does report cold intolerance and mild fatigue but no shortness of breath or difficulty breathing.  She denies any cough, wheezing or hemoptysis.  She denies any recent hospitalization or illnesses.   Unchanged on review. Current Outpatient Medications on File Prior to Visit  Medication Sig Dispense Refill  . allopurinol (ZYLOPRIM) 100 MG tablet Take 100 mg by mouth daily.    Marland Kitchen amiodarone (PACERONE) 200 MG tablet Take 200 mg by mouth daily.    Marland Kitchen amLODipine (NORVASC) 10 MG tablet Take 1 tablet (10 mg total) by mouth daily. 90 tablet 3  . atorvastatin (LIPITOR) 40 MG tablet TAKE 1 TABLET BY MOUTH EVERY DAY 90 tablet 0  . blood glucose meter kit and supplies Dispense based on patient and insurance preference. Use up to four times daily as directed. (FOR ICD-10 E10.9, E11.9). 1 each 3  . colchicine 0.6 MG tablet Take 0.6 mg by mouth 2 (two) times daily as needed (for  gout).    Marland Kitchen doxazosin (CARDURA) 1 MG tablet Take 1 tablet (1 mg total) by mouth at bedtime. 30 tablet 6  . doxycycline (ADOXA) 100 MG tablet Take 1 tablet (100 mg total) by mouth 2 (two) times daily for 7 days. 14 tablet 0  . furosemide (LASIX) 40 MG tablet TAKE 1 TABLET BY MOUTH EVERY DAY 90 tablet 1  . gabapentin (NEURONTIN) 300 MG capsule     . glucose blood test strip Use as instructed 100 each 12  . hydrALAZINE (APRESOLINE) 100 MG tablet Take 1 tablet (100 mg total) by mouth 3 (three) times daily. 180 tablet 5  . insulin glargine (LANTUS) 100 unit/mL SOPN Inject 0.15 mLs (15 Units total) into the skin daily. (Patient taking differently: Inject 15 Units into the skin daily as needed (blood sugar over 150). )    . Insulin Pen Needle 29G X 5MM MISC Use as directed 200 each 0  . latanoprost (XALATAN) 0.005 % ophthalmic solution Place 1 drop into both eyes at bedtime.     Marland Kitchen levothyroxine (SYNTHROID) 88 MCG tablet Take 1 tablet (88 mcg total) by mouth daily. 30 tablet 2  . metoprolol succinate (TOPROL-XL) 25 MG 24 hr tablet Take 1 tablet (25 mg total) by mouth daily. STOP taking this unless heart rate >100bpm OR directed by your doctor 30 tablet 5  . Multiple Vitamins-Minerals (CENTRUM SILVER 50+WOMEN) TABS Take 1 tablet by mouth daily.    . sildenafil (  REVATIO) 20 MG tablet Take 1 tablet (20 mg total) by mouth 3 (three) times daily. 270 tablet 3  . sitaGLIPtin (JANUVIA) 50 MG tablet Take 1 tablet (50 mg total) by mouth daily. 90 tablet 0               Allergies:  Allergies  Allergen Reactions  . Ancef [Cefazolin] Itching    Severe itching- after procedure, ancef was the antibiotic.-04/02/17 Tolerates penicillins  . Lactose Intolerance (Gi) Diarrhea  . Sulfa Antibiotics Rash and Other (See Comments)    Blisters, also     Physical Exam:     Blood pressure (!) 137/56, pulse 80, temperature (!) 96.4 F (35.8 C), temperature source Tympanic, resp. rate 18, height 5' 6.2"  (1.681 m), weight 212 lb (96.2 kg), SpO2 99 %.      ECOG: 1    General appearance: Alert, awake without any distress. Head: Atraumatic without abnormalities Oropharynx: Without any thrush or ulcers. Eyes: No scleral icterus. Lymph nodes: No lymphadenopathy noted in the cervical, supraclavicular, or axillary nodes Heart:regular rate and rhythm, without any murmurs or gallops.   Lung: Clear to auscultation without any rhonchi, wheezes or dullness to percussion. Abdomin: Soft, nontender without any shifting dullness or ascites. Musculoskeletal: No clubbing or cyanosis. Neurological: No motor or sensory deficits. Skin: No rashes or lesions.                     Lab Results: Lab Results  Component Value Date   WBC 4.9 04/28/2020   HGB 8.3 (L) 04/28/2020   HCT 24.2 (L) 04/28/2020   MCV 88.3 04/28/2020   PLT 174 04/28/2020     Chemistry      Component Value Date/Time   NA 134 05/11/2020 1232   NA 134 (L) 05/26/2017 0949   K 4.9 05/11/2020 1232   K 4.0 05/26/2017 0949   CL 100 05/11/2020 1232   CL 96 (L) 03/31/2012 1513   CO2 18 (L) 05/11/2020 1232   CO2 24 05/26/2017 0949   BUN 65 (H) 05/11/2020 1232   BUN 29.2 (H) 05/26/2017 0949   CREATININE 2.91 (H) 05/11/2020 1232   CREATININE 3.15 (HH) 04/28/2020 1127   CREATININE 2.24 (H) 01/27/2019 1355   CREATININE 1.4 (H) 05/26/2017 0949      Component Value Date/Time   CALCIUM 9.1 05/11/2020 1232   CALCIUM 8.9 05/26/2017 0949   ALKPHOS 95 05/11/2020 1232   ALKPHOS 157 (H) 05/26/2017 0949   AST 25 05/11/2020 1232   AST 24 04/28/2020 1127   AST 29 05/26/2017 0949   ALT 15 05/11/2020 1232   ALT 15 04/28/2020 1127   ALT 23 05/26/2017 0949   BILITOT 1.0 05/11/2020 1232   BILITOT 0.7 04/28/2020 1127   BILITOT 2.19 (H) 05/26/2017 0949      Impression and Plan:  64 year old woman with:   1.  Myelodysplastic syndrome with symptomatic anemia since 2017.  She continues to receive Reblozyl with  reasonable response to therapy.  Risks and benefits of continuing this treatment were discussed today.  Complications including thromboembolism and hypertension were reiterated.  He is agreeable to continue at this time.  2.  Anemia: Related to MDS and continues to be transfusion dependent although her transfusion needs have decreased.  Her hemoglobin currently at 7.2 and will transfuse 2 units of packed red cells.   3. Hypertension: Under control at this time without any recent exacerbation.   4.  Aortic stenosis: No recent cardiac exacerbation or  heart failure noted.  Continues to follow with cardiology.   5.  Prognosis: Therapy remains palliative without any curative measures at this time.  Aggressive measures are warranted given her reasonable performance status.  6.  Covid vaccination considerations: She is up-to-date and completed vaccination series with a booster in addition to natural immunity from previous infection.  7. Follow-up: Every 3 weeks for growth factor support and MD follow-up in 3 months.  30  minutes were spent on this visit.  The time was dedicated to reviewing her disease status, reviewing laboratory data and future plan of care review.   Zola Button, MD 11/4/20218:11 AM

## 2020-05-19 ENCOUNTER — Telehealth: Payer: Self-pay | Admitting: Oncology

## 2020-05-19 LAB — TYPE AND SCREEN
ABO/RH(D): A NEG
Antibody Screen: NEGATIVE
Unit division: 0
Unit division: 0

## 2020-05-19 LAB — BPAM RBC
Blood Product Expiration Date: 202111252359
Blood Product Expiration Date: 202111292359
ISSUE DATE / TIME: 202111041114
ISSUE DATE / TIME: 202111041114
Unit Type and Rh: 600
Unit Type and Rh: 600

## 2020-05-19 NOTE — Telephone Encounter (Signed)
Appointments scheduled per 11/4 los. Will have updated calendar printed for patient at next visit.

## 2020-05-22 ENCOUNTER — Encounter: Payer: Self-pay | Admitting: Cardiovascular Disease

## 2020-05-22 ENCOUNTER — Inpatient Hospital Stay: Payer: Medicare Other

## 2020-05-22 ENCOUNTER — Ambulatory Visit (INDEPENDENT_AMBULATORY_CARE_PROVIDER_SITE_OTHER): Payer: BC Managed Care – PPO | Admitting: Cardiovascular Disease

## 2020-05-22 ENCOUNTER — Other Ambulatory Visit: Payer: Self-pay

## 2020-05-22 VITALS — BP 162/54 | HR 76 | Ht 66.0 in | Wt 218.0 lb

## 2020-05-22 VITALS — BP 160/44 | HR 70 | Resp 18

## 2020-05-22 DIAGNOSIS — D469 Myelodysplastic syndrome, unspecified: Secondary | ICD-10-CM

## 2020-05-22 DIAGNOSIS — I6523 Occlusion and stenosis of bilateral carotid arteries: Secondary | ICD-10-CM

## 2020-05-22 DIAGNOSIS — I4891 Unspecified atrial fibrillation: Secondary | ICD-10-CM | POA: Diagnosis not present

## 2020-05-22 DIAGNOSIS — I350 Nonrheumatic aortic (valve) stenosis: Secondary | ICD-10-CM

## 2020-05-22 DIAGNOSIS — I5031 Acute diastolic (congestive) heart failure: Secondary | ICD-10-CM | POA: Diagnosis not present

## 2020-05-22 DIAGNOSIS — I10 Essential (primary) hypertension: Secondary | ICD-10-CM

## 2020-05-22 MED ORDER — LUSPATERCEPT-AAMT 75 MG ~~LOC~~ SOLR
1.3300 mg/kg | Freq: Once | SUBCUTANEOUS | Status: AC
  Administered 2020-05-22: 115 mg via SUBCUTANEOUS
  Filled 2020-05-22: qty 1

## 2020-05-22 MED ORDER — CARVEDILOL 12.5 MG PO TABS: 12.5000 mg | ORAL_TABLET | Freq: Two times a day (BID) | ORAL | 3 refills | Status: DC

## 2020-05-22 NOTE — Patient Instructions (Addendum)
Medication Instructions:  START CARVEDILOL 12.5 MG TWICE A DAY   DECREASE YOUR COLCHICINE TO 0.6 MG 1/2 TABLET TWICE DAY AS NEEDED FOR GOUT   Labwork: NONE  Testing/Procedures: NONE  Follow-Up: 06/21/2020 AT 2:00 PM WITH PHARM D   Any Other Special Instructions Will Be Listed Below (If Applicable).   If you need a refill on your cardiac medications before your next appointment, please call your pharmacy.

## 2020-05-22 NOTE — Patient Instructions (Signed)
Luspatercept injection What is this medicine? LUSPATERCEPT (lus PAT er sept) helps your body make more red blood cells. This medicine is used to treat anemia caused by beta thalassemia or myelodysplastic syndromes. This medicine may be used for other purposes; ask your health care provider or pharmacist if you have questions. COMMON BRAND NAME(S): REBLOZYL What should I tell my health care provider before I take this medicine? They need to know if you have any of these conditions:  cigarette smoker  have had your spleen removed  high blood pressure  history of blood clots  an unusual or allergic reaction to luspatercept, other medicines, foods, dyes or preservatives  pregnant or trying to get pregnant  breast-feeding How should I use this medicine? This medicine is for injection under the skin. It is given by a healthcare professional in a hospital or clinic setting. Talk to your pediatrician about the use of the medicine in children. This medicine is not approved for use in children. Overdosage: If you think you have taken too much of this medicine contact a poison control center or emergency room at once. NOTE: This medicine is only for you. Do not share this medicine with others. What if I miss a dose? Keep appointments for follow-up doses. It is important not to miss your dose. Call your doctor or healthcare professional if you are unable to keep an appointment. What may interact with this medicine? Interactions are not expected. This list may not describe all possible interactions. Give your health care provider a list of all the medicines, herbs, non-prescription drugs, or dietary supplements you use. Also tell them if you smoke, drink alcohol, or use illegal drugs. Some items may interact with your medicine. What should I watch for while using this medicine? Your condition will be monitored carefully while you are receiving this medicine. Do not become pregnant while taking  this medicine or for 3 months after stopping it. Women should inform their healthcare professional if they wish to become pregnant or think they might be pregnant. There is a potential for serious side effects and harm to an unborn child. Talk to your healthcare professional for more information. Do not breast-feed an infant while taking this medicine. You may need blood work done while you are taking this medicine. What side effects may I notice from receiving this medicine? Side effects that you should report to your doctor or health care professional as soon as possible:  allergic reactions like skin rash, itching or hives; swelling of the face, lips, or tongue  signs and symptoms of a blood clot such as chest pain; shortness of breath; pain, swelling, or warmth in the leg  signs and symptoms of a stroke like changes in vision; confusion; trouble speaking or understanding; severe headaches; sudden numbness or weakness of the face, arm or leg; trouble walking; dizziness; loss of balance or coordination Side effects that usually do not require medical attention (report these to your doctor or health care professional if they continue or are bothersome):  cough  diarrhea  dizziness  headache  joint pain  stomach pain  tiredness This list may not describe all possible side effects. Call your doctor for medical advice about side effects. You may report side effects to FDA at 1-800-FDA-1088. Where should I keep my medicine? This medicine is given in a hospital or clinic and will not be stored at home. NOTE: This sheet is a summary. It may not cover all possible information. If you have questions about   this medicine, talk to your doctor, pharmacist, or health care provider.  2020 Elsevier/Gold Standard (2018-10-20 15:57:59)  

## 2020-05-22 NOTE — Progress Notes (Signed)
Hypertension Clinic Initial Assessment:    Date:  05/22/2020   ID:  Patricia Mata, DOB [birth year], MRN [id number]  PCP:  [name], FNP  Cardiologist:  Quay Burow, MD  Nephrologist:  Referring MD: [name], FNP   CC: Hypertension  History of Present Illness:    Patricia Mata is a 64 y.o. female with a hx of severe aortic stenosis, moderate mitral stenosis, pulmonary hypertension, paroxysmal atrial fibrillation,  hypertension, diabetes, myelodysplasitc syndrome, transfusion dependent anemia,  and prior colon cancer s/p hemicolectomy here to establish care in the hypertension clinic.   Patricia Mata was admitted with atrial fibrillation with RVR and recurrent anemia 04/2018.  She had hypoxic respiratory failure requiring intubation.  She was transfused and treated with IV amiodarone.  She subsequently converted to sinus rhythm on IV amiodarone.  She had acute renal failure requiring CVVHD but has not needed HD as an outpatient.  Echo at that time revealed LVEF 65 to 70% with grade 2 diastolic dysfunction.  She had moderate aortic stenosis with a mean gradient of 22 mmHg.  PASP was 93 mmHg.  She was not thought to be a candidate for intervention on her aortic or mitral stenosis due to severe pulmonary hypertension and transfusion dependent myelodysplasia.  Left heart catheterization in 05/2018 revealed normal coronaries and severely elevated pulmonary pressures.  V/Q scan was negative for pulmonary embolism.  She was started on sildenafil and diuresed with IV Lasix.  She continues to require transfusions as an outpatient.   She was treated for COVID 03/2019 and feels as though she has recovered.  Repeat right heart catheterization 04/23/2019 revealed mild pulmonary hypertension (RA 8, RV 53/3, PA 57/12, mean PA of 32, pulmonary capillary wedge 14, PVR 2.1 Wood units, cardiac output 8.4, cardiac index 4.5).  Patricia Mata was admitted for pneumonia 09/2019 and treated with  antibiotics.  She was discharged and returned 3 days later for hypoxic respiratory failure requiring intubation.  She was negative for Covid and chest CT was consistent with multifocal pneumonia.  Acute on chron heart failure was also thought to be contributing.  She was again diuresed and treated with IV antibiotics.  She last saw Dr. Haroldine Laws 11/2019 and her BP was 210/64.  She was started on clonidine 58m bid.  Lately her BP has been in the 150-160s.  It is often 180 prior to taking her medication.  She notes that she was first diagnosed with hypertension in her 89sand it was initially well-controlled.  Ms. HMccrackinreported dry mouth so clonidine was reduced and hydralazine was increased.  She was enrolled in remote patient monitoring but has struggled with the vivify app.  She followed up with our pharmacist and doxazosin was increased..  She last followed up with KNehemiah Massed Pharm.D. on 04/2020 and her blood pressure was 112/58.  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  She saw Dr. BGwenlyn Foundon 03/2020.  He felt that she was not symptomatic from SMA stenosis and wanted to continue following her renal artery stenosis.  It was not thought to be contributing to her renal vascular hypertension.  Given her chronic kidney disease, there is no plan for angiography at this time.  At home her BP has be mostly in the 130s to 150s.  This morning her blood pressure was 124.  She has been feeling  well.  She participated in the prep program to the [facility] and left it.  She is now getting enrolled in Silver sneakers through her insurance and does a walk away the pounds exercise today.  She feels good with exercise and has no exertional chest pain.  Her breathing is stable.  She gets tired if she tries to do too much.  She is trying to enroll with our remote patient monitoring service through  vivify but has not been able to connect successfully.  Previous antihypertensives: Hydralazine- unsure why stopped   Past Medical History:  Diagnosis Date  . Acute kidney injury (nontraumatic) ([place])   . Acute renal failure (ARF) ([place]) 06/08/2018  . Cancer [facility]) 2008   Colon   . Diabetes mellitus without complication ([place])   . Gout 09/08/2018  . Hypertension   . Macrocytic anemia     Past Surgical History:  Procedure Laterality Date  . COLON SURGERY    . DEBRIDMENT OF DECUBITUS ULCER N/A 06/12/2018   Procedure: DEBRIDMENT OF DECUBITUS ULCER;  Surgeon: Wallace Going, DO;  Location: [facility];  Service: Plastics;  Laterality: N/A;  . IR FLUORO GUIDE PORT INSERTION RIGHT  04/02/2017  . IR REMOVAL TUN ACCESS W/ PORT W/O FL MOD SED  03/16/2019  . IR US GUIDE VASC ACCESS RIGHT  04/02/2017  . RIGHT HEART CATH N/A 04/23/2019   Procedure: RIGHT HEART CATH;  Surgeon: Jolaine Artist, MD;  Location: [place] [facility];  Service: Cardiovascular;  Laterality: N/A;  . RIGHT/LEFT HEART CATH AND CORONARY ANGIOGRAPHY N/A 05/21/2018   Procedure: RIGHT/LEFT HEART CATH AND CORONARY ANGIOGRAPHY;  Surgeon: Nelva Bush, MD;  Location: [place] [facility];  Service: Cardiovascular;  Laterality: N/A;    Current Medications: Current Meds  Medication Sig  . albuterol (PROAIR HFA) 108 (90 Base) MCG/ACT inhaler Inhale 2 puffs into the lungs every 6 (six) hours as needed for wheezing or shortness of breath.  . allopurinol (ZYLOPRIM) 100 MG tablet Take 300 mg by mouth daily.   Marland Kitchen amiodarone (PACERONE) 200 MG tablet Take 1 tablet (200 mg total) by mouth daily.  Marland Kitchen amLODipine (NORVASC) 10 MG tablet Take 1 tablet (10 mg total) by mouth daily.  Marland Kitchen atorvastatin (LIPITOR) 40 MG tablet Take 1 tablet (40 mg total) by mouth daily.  . blood glucose meter kit and supplies Dispense based on patient and insurance preference. Use up to four times daily as directed. (FOR ICD-10 E10.9, E11.9).  Marland Kitchen Blood Glucose Monitoring  Suppl (ONETOUCH ULTRALINK) w/Device KIT Check blood sugars twice daily E11.9  . cloNIDine (CATAPRES) 0.1 MG tablet Take 1 tablet (0.1 mg total) by mouth 2 (two) times daily.  . colchicine 0.6 MG tablet Take 0.6 mg by mouth as directed. Take 1/2 tablet twice a day as needed for gout  . dextromethorphan-guaiFENesin (MUCINEX DM) 30-600 MG 12hr tablet Take 1 tablet by mouth 2 (two) times daily.  Marland Kitchen doxazosin (CARDURA) 8 MG tablet Take 1 tablet (8 mg total) by mouth daily.  . fluticasone (FLONASE) 50 MCG/ACT nasal spray Place 1 spray into both nostrils daily.  . furosemide (LASIX) 40 MG tablet Take 1 tablet by mouth once daily  . glucose blood test strip check blood sugars twice daily Dx code E11.22  . hydrALAZINE (APRESOLINE) 100 MG tablet Take 1 tablet (100 mg total) by mouth 3 (three) times daily.  . Insulin Glargine (BASAGLAR KWIKPEN) 100 UNIT/ML Inject 0.14 mLs (14 Units total) into the skin daily. (Patient taking differently: Inject 16  Units into the skin daily. )  . Insulin Pen Needle 29G X 5MM MISC Use as directed  . Lancets (ONETOUCH DELICA PLUS [id number]) MISC check blood sugars twice daily Dx code: E11.22  . latanoprost (XALATAN) 0.005 % ophthalmic solution Place 1 drop into both eyes at bedtime.   Marland Kitchen levothyroxine (SYNTHROID) 125 MCG tablet Take 1 tablet (125 mcg total) by mouth daily.  Marland Kitchen linagliptin (TRADJENTA) 5 MG TABS tablet Take 1 tablet (5 mg total) by mouth daily.  Marland Kitchen loratadine (CLARITIN) 10 MG tablet Take 1 tablet (10 mg total) by mouth daily.  . Multiple Vitamins-Minerals (CENTRUM SILVER 50+WOMEN) TABS Take 1 tablet by mouth daily.  Marland Kitchen oxymetazoline (AFRIN) 0.05 % nasal spray Place 1 spray into both nostrils 2 (two) times daily as needed for congestion.  . pantoprazole (PROTONIX) 40 MG tablet Take 1 tablet (40 mg total) by mouth daily.  . sildenafil (REVATIO) 20 MG tablet TAKE 1 TABLET BY MOUTH THREE TIMES DAILY  . valACYclovir (VALTREX) 500 MG tablet Take 1 tablet by mouth two times  a day  . [DISCONTINUED] colchicine 0.6 MG tablet TAKE 1 TABLET BY MOUTH TWICE A DAY AS NEEDED FOR GOUT     Allergies:   Ancef [cefazolin], Lactose intolerance (gi), and Sulfa antibiotics   Social History   Socioeconomic History  . Marital status: Married    Spouse name: Not on file  . Number of children: Not on file  . Years of education: Not on file  . Highest education level: Not on file  Occupational History  . Not on file  Tobacco Use  . Smoking status: Former Smoker    Packs/day: 0.25    Years: 20.00    Pack years: 5.00    Types: Cigarettes    Quit date: 01/31/2001    Years since quitting: 19.3  . Smokeless tobacco: Former Network engineer  . Vaping Use: Never used  Substance and Sexual Activity  . Alcohol use: Yes    Alcohol/week: 2.0 standard drinks    Types: 2 Shots of liquor per week  . Drug use: Never  . Sexual activity: Not on file  Other Topics Concern  . Not on file  Social History Narrative  . Not on file   Social Determinants of Health   Financial Resource Strain: Medium Risk  . Difficulty of Paying Living Expenses: Somewhat hard  Food Insecurity:   . Worried About Charity fundraiser in the Last Year: Not on file  . Ran Out of Food in the Last Year: Not on file  Transportation Needs:   . Lack of Transportation (Medical): Not on file  . Lack of Transportation (Non-Medical): Not on file  Physical Activity:   . Days of Exercise per Week: Not on file  . Minutes of Exercise per Session: Not on file  Stress:   . Feeling of Stress : Not on file  Social Connections:   . Frequency of Communication with Friends and Family: Not on file  . Frequency of Social Gatherings with Friends and Family: Not on file  . Attends Religious Services: Not on file  . Active Member of Clubs or Organizations: Not on file  . Attends Archivist Meetings: Not on file  . Marital Status: Not on file     Family History: The patient's family history includes  Cervical cancer in her mother; Diabetes in her mother; HIV/AIDS in her brother; Heart attack in her father; Hypertension in her brother, mother, sister, sister,  and sister; Prostate cancer in her brother.  ROS:   Please see the history of present illness.    All other systems reviewed and are negative.  EKGs/Labs/Other Studies Reviewed:    EKG:  EKG is not ordered today.  The ekg ordered 09/27/19 demonstrates sinus rhythm.  Rate 74 bpm.  QTc 470 ms.  ?LVH.  Nonspecific T wave abnormalities   Echo 05/12/18: Study Conclusions   - Left ventricle: The cavity size was normal. There was moderate  concentric hypertrophy. Systolic function was vigorous. The  estimated ejection fraction was in the range of 65% to 70%. Wall  motion was normal; there were no regional wall motion  abnormalities. Features are consistent with a pseudonormal left  ventricular filling pattern, with concomitant abnormal relaxation  and increased filling pressure (grade 2 diastolic dysfunction).  Doppler parameters are consistent with high ventricular filling  pressure.  - Aortic valve: Trileaflet; moderately thickened, moderately  calcified leaflets. Valve mobility was restricted. There was  moderate stenosis. Mean gradient (S): 22 mm Hg. VTI ratio of LVOT  to aortic valve: 0.37. Valve area (VTI): 1.28 cm^2. Valve area  (Vmax): 1.01 cm^2. Valve area (Vmean): 1.18 cm^2.  - Mitral valve: Calcified annulus. Moderate diffuse thickening,  calcification, and elongation. The findings are consistent with  mild to moderate stenosis. Deceleration time: 324 ms.  - Tricuspid valve: There was moderate regurgitation.  - Pulmonic valve: There was trivial regurgitation.  - Pulmonary arteries: PA peak pressure: 93 mm Hg (S).  Echo 09/21/19: 1. Left ventricular ejection fraction, by estimation, is 65 to 70%. The  left ventricle has hyperdynamic function. The left ventricle has no  regional wall motion  abnormalities. Left ventricular diastolic parameters  are consistent with Grade II diastolic  dysfunction (pseudonormalization). Elevated left atrial pressure.  2. Right ventricular systolic function is normal. The right ventricular  size is normal. There is moderately elevated pulmonary artery systolic  pressure.  3. Left atrial size was severely dilated.  4. The mitral valve is rheumatic. Mild to moderate mitral valve  regurgitation. Mild to moderate mitral stenosis. The mean mitral valve  gradient is 9.4 mmHg with average heart rate of 81 bpm.  5. The aortic valve has an indeterminant number of cusps. Aortic valve  regurgitation is trivial. Severe aortic valve stenosis. Aortic valve area,  by VTI measures 0.82 cm. Aortic valve mean gradient measures 42.0 mmHg.  Aortic valve Vmax measures 4.32  m/s.  6. The inferior vena cava is normal in size with greater than 50%  respiratory variability, suggesting right atrial pressure of 3 mmHg.  7. Agitated saline contrast bubble study was negative, with no evidence  of any interatrial shunt.   Comparison(s): Prior images reviewed side by side. Changes from prior  study are noted. Increased mitral gradients are partly explained by  increased heart rate and cardiac output.   LHC/RHC 05/21/18: Conclusions: 1. No angiographically significant coronary artery disease. 2. Severe pulmonary hypertension (mean PAP 62 mmHg; PVR 5.4 WU). 3. Mildly elevated left heart filling pressure. 4. Moderately elevated right heart filling pressure.  Right atrial waveform has an "M" or "W" appearance that suggests restrictive/contrictive physiology. 5. Moderate aortic stenosis. 6. Probably moderate mitral stenosis.  Transmitral gradient (12 mmHg) may be accentuated by mitral regurgitation and anemia.  Prominent v-waves also suggest a degree of mitral regurgitation. 7. Normal to supranormal Fick cardiac output/index.  Recommendations: 1. Anticipate  restarting diuresis tomorrow, as renal function allows following contrast administration today. 2. Consider advanced heart  failure consultation to assist with management of HFpEF and severe pulmonary hypertension. I do not believe that valvular disease is the primary cause of the patient's symptoms. 3. Primary prevention of coronary artery disease. RA (mean): 14 mmHg RV (S/EDP): 110/15 mmHg PA (S/D, mean): 105/40 (62) mmHg PCWP (mean): 25 mmHg Ao sat: 91% PA sat: 53 Fick CO: 6.8 L/min Fick CI: 4.3 L/min/m^2 PVR: 5.4 Wood units  RHC 10/20 RA = 8 RV = 53/3 PA = 57/12 (32) PCW = 14 (v waves to 32) Fick cardiac output/index = 8.4/4.5 PVR = 2.1 WU Ao sat = 99% PA sat = 73%, 74% High SVC sat = 74%  Renal artery Dopplers 02/08/20: Right: Normal size right kidney. Abnormal right Resistive Index.     Normal cortical thickness of right kidney. Evidence of a     greater than 60% stenosis of the right renal artery (low end     of range based on peak velocity). RRV flow present.  Left: Normal size of left kidney. Abnormal left Resisitve Index.     Normal cortical thickness of the left kidney. 1-59% stenosis     of the left renal artery. LRV flow present.  Mesenteric:  70 to 99% stenosis in the celiac artery and superior mesenteric artery.    Recent Labs: 09/20/2019: B Natriuretic Peptide 548.2 09/28/2019: Magnesium 1.6 05/01/2020: TSH 6.980 05/18/2020: ALT 14; BUN 77; Creatinine 2.78; Hemoglobin 7.4; Platelet Count 172; Potassium 4.3; Sodium 137   Recent Lipid Panel    Component Value Date/Time   CHOL 174 05/11/2020 1232   TRIG 58 05/11/2020 1232   HDL 84 05/11/2020 1232   CHOLHDL 2.1 05/11/2020 1232   CHOLHDL 3.0 03/20/2019 0406   VLDL 9 03/20/2019 0406   LDLCALC 79 05/11/2020 1232    Physical Exam:    VS:  BP (!) 162/54   Pulse 76   Ht '5\' 6"'  (1.676 m)   Wt 218 lb (98.9 kg)   SpO2 99%   BMI 35.19 kg/m  , BMI Body mass index is 35.19 kg/m. GENERAL:   Well appearing HEENT: Pupils equal round and reactive, fundi not visualized, oral mucosa unremarkable NECK:  No jugular venous distention, waveform within normal limits, carotid upstroke brisk and symmetric, no bruits LUNGS:  Clear to auscultation bilaterally HEART:  RRR.  PMI not displaced or sustained,S1 and S2 within normal limits, no S3, no S4, no clicks, no rubs, II/VI systolic murmur at the LUSB ABD:  Flat, positive bowel sounds normal in frequency in pitch, no bruits, no rebound, no guarding, no midline pulsatile mass, no hepatomegaly, no splenomegaly EXT:  2 plus pulses throughout, no edema, no cyanosis no clubbing SKIN:  No rashes no nodules NEURO:  Cranial nerves II through XII grossly intact, motor grossly intact throughout PSYCH:  Cognitively intact, oriented to person place and time   ASSESSMENT:    1. Acute diastolic CHF (congestive heart failure) ([place])   2. Atrial fibrillation, transient ([place])   3. Bilateral carotid artery stenosis   4. Essential hypertension   5. Severe aortic stenosis     PLAN:    # Essential hypertension:  Ms. Myint blood pressure was initially controlled but elevated on repeat.  She went to the infusion center earlier today and her blood pressure was high.  It seems that it continues to be somewhat labile.  We will add carvedilol 12.5 mg twice daily.  She will continue amlodipine, clonidine, doxazosin, hydralazine, and furosemide.  Her renal function has been stable.  She does have renal artery stenosis, but there are no plans for intervention at this time.  She will need repeat Dopplers in a year.  Continue with her exercise regimen.  Avoiding ACE inhibitor's and ARB is due to her chronic kidney disease previously requiring CVVHD.  Secondary Causes of Hypertension  Medications/Herbal: OCP, steroids, stimulants, antidepressants, weight loss medication, immune suppressants, NSAIDs, sympathomimetics, alcohol, caffeine, licorice, ginseng, [place]. John's  wort, chemo (none apply)  Sleep Apnea (no symptoms)  Renal artery stenosis repeat Dopplers 01/2021 Hyperaldosteronism (testing not indicated) no adrenal adenomas 04/2017 Hyper/hypothyroidism (mildly hypothyroid 11/2019- meds adjusted) Pheochromocytoma: (testing not indicated) Cushing's syndrome:(testing not indicated) Coarctation of the aorta: BP symmetric 05/2020  # PAH: Pulmonary pressure improved significantly with the addition of sildenafil.  Her volume status is quite stable.  Continue furosemide and sildenafil as ordered by Dr. Haroldine Laws.  Of course, sildenafil is being used cautiously in the setting of moderate to severe aortic stenosis.  #Moderate to severe aortic stenosis, mitral stenosis: Managed by Drs. Britta Mccreedy on and read as above.  Volume status stable.  Not a candidate for intervention due to her anemia requiring recurrent transfusions.  # Paroxysmal atrial fibrillation: Continues to maintain sinus rhythm on amiodarone.  Not a candidate for anticoagulation due to chronic transfusion dependent anemia as above.  Adding carvedilol as above.   Disposition:    FU with MD/PharmD in 4-6 weeks   Medication Adjustments/Labs and Tests Ordered: Current medicines are reviewed at length with the patient today.  Concerns regarding medicines are outlined above.  No orders of the defined types were placed in this encounter.  Meds ordered this encounter  Medications  . carvedilol (COREG) 12.5 MG tablet    Sig: Take 1 tablet (12.5 mg total) by mouth 2 (two) times daily.    Dispense:  180 tablet    Refill:  3     Signed, Skeet Latch, MD  05/22/2020 4:47 PM    [place]

## 2020-05-24 ENCOUNTER — Other Ambulatory Visit: Payer: BC Managed Care – PPO

## 2020-05-27 ENCOUNTER — Other Ambulatory Visit (HOSPITAL_COMMUNITY): Payer: Self-pay | Admitting: Internal Medicine

## 2020-05-29 ENCOUNTER — Other Ambulatory Visit (HOSPITAL_COMMUNITY): Payer: Self-pay | Admitting: *Deleted

## 2020-05-30 ENCOUNTER — Ambulatory Visit
Admission: RE | Admit: 2020-05-30 | Discharge: 2020-05-30 | Disposition: A | Discharge status: Home / Self Care | Payer: BC Managed Care – PPO | Source: Ambulatory Visit | Attending: Nurse Practitioner | Admitting: Nurse Practitioner

## 2020-05-30 DIAGNOSIS — N939 Abnormal uterine and vaginal bleeding, unspecified: Secondary | ICD-10-CM

## 2020-05-31 ENCOUNTER — Other Ambulatory Visit: Payer: Self-pay | Admitting: Nurse Practitioner

## 2020-05-31 DIAGNOSIS — N950 Postmenopausal bleeding: Secondary | ICD-10-CM

## 2020-05-31 DIAGNOSIS — R9389 Abnormal findings on diagnostic imaging of other specified body structures: Secondary | ICD-10-CM

## 2020-05-31 LAB — HM MAMMOGRAPHY

## 2020-06-01 ENCOUNTER — Encounter (HOSPITAL_COMMUNITY): Payer: BC Managed Care – PPO

## 2020-06-02 ENCOUNTER — Ambulatory Visit (HOSPITAL_COMMUNITY): Payer: BC Managed Care – PPO

## 2020-06-05 ENCOUNTER — Ambulatory Visit: Payer: Self-pay | Admitting: Nurse Practitioner

## 2020-06-07 ENCOUNTER — Inpatient Hospital Stay (HOSPITAL_COMMUNITY): Payer: Medicare Other

## 2020-06-07 ENCOUNTER — Inpatient Hospital Stay (HOSPITAL_COMMUNITY)
Admission: EM | Admit: 2020-06-07 | Discharge: 2020-06-11 | DRG: 291 | Disposition: A | Discharge status: Home Health | Payer: Medicare Other | Attending: Internal Medicine | Admitting: Internal Medicine

## 2020-06-07 ENCOUNTER — Encounter (HOSPITAL_COMMUNITY): Payer: Self-pay | Admitting: Internal Medicine

## 2020-06-07 ENCOUNTER — Ambulatory Visit (HOSPITAL_COMMUNITY): Admission: RE | Admit: 2020-06-07 | Payer: BC Managed Care – PPO | Source: Ambulatory Visit

## 2020-06-07 ENCOUNTER — Other Ambulatory Visit: Payer: Self-pay

## 2020-06-07 ENCOUNTER — Emergency Department (HOSPITAL_COMMUNITY): Payer: Medicare Other

## 2020-06-07 DIAGNOSIS — Z830 Family history of human immunodeficiency virus [HIV] disease: Secondary | ICD-10-CM

## 2020-06-07 DIAGNOSIS — E039 Hypothyroidism, unspecified: Secondary | ICD-10-CM | POA: Diagnosis present

## 2020-06-07 DIAGNOSIS — J9601 Acute respiratory failure with hypoxia: Secondary | ICD-10-CM | POA: Diagnosis present

## 2020-06-07 DIAGNOSIS — M109 Gout, unspecified: Secondary | ICD-10-CM | POA: Diagnosis present

## 2020-06-07 DIAGNOSIS — E739 Lactose intolerance, unspecified: Secondary | ICD-10-CM | POA: Diagnosis present

## 2020-06-07 DIAGNOSIS — E669 Obesity, unspecified: Secondary | ICD-10-CM | POA: Diagnosis present

## 2020-06-07 DIAGNOSIS — Z833 Family history of diabetes mellitus: Secondary | ICD-10-CM | POA: Diagnosis not present

## 2020-06-07 DIAGNOSIS — I342 Nonrheumatic mitral (valve) stenosis: Secondary | ICD-10-CM

## 2020-06-07 DIAGNOSIS — I10 Essential (primary) hypertension: Secondary | ICD-10-CM | POA: Diagnosis present

## 2020-06-07 DIAGNOSIS — Z85038 Personal history of other malignant neoplasm of large intestine: Secondary | ICD-10-CM

## 2020-06-07 DIAGNOSIS — Z20822 Contact with and (suspected) exposure to covid-19: Secondary | ICD-10-CM | POA: Diagnosis present

## 2020-06-07 DIAGNOSIS — E785 Hyperlipidemia, unspecified: Secondary | ICD-10-CM | POA: Diagnosis present

## 2020-06-07 DIAGNOSIS — I2721 Secondary pulmonary arterial hypertension: Secondary | ICD-10-CM | POA: Diagnosis present

## 2020-06-07 DIAGNOSIS — E119 Type 2 diabetes mellitus without complications: Secondary | ICD-10-CM

## 2020-06-07 DIAGNOSIS — I5033 Acute on chronic diastolic (congestive) heart failure: Secondary | ICD-10-CM | POA: Diagnosis not present

## 2020-06-07 DIAGNOSIS — E1122 Type 2 diabetes mellitus with diabetic chronic kidney disease: Secondary | ICD-10-CM | POA: Diagnosis present

## 2020-06-07 DIAGNOSIS — I350 Nonrheumatic aortic (valve) stenosis: Secondary | ICD-10-CM

## 2020-06-07 DIAGNOSIS — Z9049 Acquired absence of other specified parts of digestive tract: Secondary | ICD-10-CM

## 2020-06-07 DIAGNOSIS — Z8249 Family history of ischemic heart disease and other diseases of the circulatory system: Secondary | ICD-10-CM

## 2020-06-07 DIAGNOSIS — I083 Combined rheumatic disorders of mitral, aortic and tricuspid valves: Secondary | ICD-10-CM | POA: Diagnosis present

## 2020-06-07 DIAGNOSIS — Z7189 Other specified counseling: Secondary | ICD-10-CM | POA: Diagnosis not present

## 2020-06-07 DIAGNOSIS — I480 Paroxysmal atrial fibrillation: Secondary | ICD-10-CM | POA: Diagnosis present

## 2020-06-07 DIAGNOSIS — Z515 Encounter for palliative care: Secondary | ICD-10-CM | POA: Diagnosis not present

## 2020-06-07 DIAGNOSIS — N189 Chronic kidney disease, unspecified: Secondary | ICD-10-CM | POA: Diagnosis present

## 2020-06-07 DIAGNOSIS — N184 Chronic kidney disease, stage 4 (severe): Secondary | ICD-10-CM | POA: Diagnosis not present

## 2020-06-07 DIAGNOSIS — R0602 Shortness of breath: Secondary | ICD-10-CM | POA: Diagnosis present

## 2020-06-07 DIAGNOSIS — I779 Disorder of arteries and arterioles, unspecified: Secondary | ICD-10-CM | POA: Diagnosis present

## 2020-06-07 DIAGNOSIS — D649 Anemia, unspecified: Secondary | ICD-10-CM

## 2020-06-07 DIAGNOSIS — I701 Atherosclerosis of renal artery: Secondary | ICD-10-CM | POA: Diagnosis present

## 2020-06-07 DIAGNOSIS — D631 Anemia in chronic kidney disease: Secondary | ICD-10-CM | POA: Diagnosis present

## 2020-06-07 DIAGNOSIS — I5043 Acute on chronic combined systolic (congestive) and diastolic (congestive) heart failure: Secondary | ICD-10-CM | POA: Diagnosis present

## 2020-06-07 DIAGNOSIS — I272 Pulmonary hypertension, unspecified: Secondary | ICD-10-CM | POA: Diagnosis not present

## 2020-06-07 DIAGNOSIS — I132 Hypertensive heart and chronic kidney disease with heart failure and with stage 5 chronic kidney disease, or end stage renal disease: Secondary | ICD-10-CM | POA: Diagnosis present

## 2020-06-07 DIAGNOSIS — Z8042 Family history of malignant neoplasm of prostate: Secondary | ICD-10-CM

## 2020-06-07 DIAGNOSIS — I4891 Unspecified atrial fibrillation: Secondary | ICD-10-CM | POA: Diagnosis present

## 2020-06-07 DIAGNOSIS — D469 Myelodysplastic syndrome, unspecified: Secondary | ICD-10-CM | POA: Diagnosis present

## 2020-06-07 DIAGNOSIS — R0902 Hypoxemia: Secondary | ICD-10-CM | POA: Diagnosis not present

## 2020-06-07 DIAGNOSIS — Z8049 Family history of malignant neoplasm of other genital organs: Secondary | ICD-10-CM

## 2020-06-07 DIAGNOSIS — Z6838 Body mass index (BMI) 38.0-38.9, adult: Secondary | ICD-10-CM | POA: Diagnosis not present

## 2020-06-07 DIAGNOSIS — Z794 Long term (current) use of insulin: Secondary | ICD-10-CM

## 2020-06-07 DIAGNOSIS — I340 Nonrheumatic mitral (valve) insufficiency: Secondary | ICD-10-CM | POA: Diagnosis not present

## 2020-06-07 DIAGNOSIS — I6523 Occlusion and stenosis of bilateral carotid arteries: Secondary | ICD-10-CM | POA: Diagnosis not present

## 2020-06-07 DIAGNOSIS — N179 Acute kidney failure, unspecified: Secondary | ICD-10-CM | POA: Diagnosis present

## 2020-06-07 DIAGNOSIS — Z87891 Personal history of nicotine dependence: Secondary | ICD-10-CM

## 2020-06-07 DIAGNOSIS — I080 Rheumatic disorders of both mitral and aortic valves: Secondary | ICD-10-CM | POA: Diagnosis not present

## 2020-06-07 DIAGNOSIS — I050 Rheumatic mitral stenosis: Secondary | ICD-10-CM | POA: Diagnosis not present

## 2020-06-07 DIAGNOSIS — I6529 Occlusion and stenosis of unspecified carotid artery: Secondary | ICD-10-CM | POA: Diagnosis present

## 2020-06-07 DIAGNOSIS — N185 Chronic kidney disease, stage 5: Secondary | ICD-10-CM | POA: Diagnosis present

## 2020-06-07 DIAGNOSIS — Z79899 Other long term (current) drug therapy: Secondary | ICD-10-CM

## 2020-06-07 DIAGNOSIS — Z881 Allergy status to other antibiotic agents status: Secondary | ICD-10-CM

## 2020-06-07 DIAGNOSIS — Z882 Allergy status to sulfonamides status: Secondary | ICD-10-CM

## 2020-06-07 HISTORY — DX: Chronic kidney disease, stage 4 (severe): N18.4

## 2020-06-07 HISTORY — DX: Myelodysplastic syndrome, unspecified: D46.9

## 2020-06-07 HISTORY — DX: Chronic diastolic (congestive) heart failure: I50.32

## 2020-06-07 LAB — CBC
HCT: 23.1 % — ABNORMAL LOW (ref 36.0–46.0)
Hemoglobin: 7.4 g/dL — ABNORMAL LOW (ref 12.0–15.0)
MCH: 30.2 pg (ref 26.0–34.0)
MCHC: 32 g/dL (ref 30.0–36.0)
MCV: 94.3 fL (ref 80.0–100.0)
Platelets: 178 10*3/uL (ref 150–400)
RBC: 2.45 MIL/uL — ABNORMAL LOW (ref 3.87–5.11)
RDW: 21.5 % — ABNORMAL HIGH (ref 11.5–15.5)
WBC: 7.1 10*3/uL (ref 4.0–10.5)
nRBC: 2.7 % — ABNORMAL HIGH (ref 0.0–0.2)

## 2020-06-07 LAB — COMPREHENSIVE METABOLIC PANEL
ALT: 19 U/L (ref 0–44)
AST: 31 U/L (ref 15–41)
Albumin: 3.6 g/dL (ref 3.5–5.0)
Alkaline Phosphatase: 75 U/L (ref 38–126)
Anion gap: 15 (ref 5–15)
BUN: 80 mg/dL — ABNORMAL HIGH (ref 8–23)
CO2: 16 mmol/L — ABNORMAL LOW (ref 22–32)
Calcium: 9.1 mg/dL (ref 8.9–10.3)
Chloride: 102 mmol/L (ref 98–111)
Creatinine, Ser: 3.21 mg/dL — ABNORMAL HIGH (ref 0.44–1.00)
GFR, Estimated: 15 mL/min — ABNORMAL LOW (ref >60)
Glucose, Bld: 224 mg/dL — ABNORMAL HIGH (ref 70–99)
Potassium: 4 mmol/L (ref 3.5–5.1)
Sodium: 133 mmol/L — ABNORMAL LOW (ref 135–145)
Total Bilirubin: 1 mg/dL (ref 0.3–1.2)
Total Protein: 7 g/dL (ref 6.5–8.1)

## 2020-06-07 LAB — RESPIRATORY PANEL BY RT PCR (FLU A&B, COVID)
Influenza A by PCR: NEGATIVE
Influenza B by PCR: NEGATIVE
SARS Coronavirus 2 by RT PCR: NEGATIVE

## 2020-06-07 LAB — TSH: TSH: 10.361 u[IU]/mL — ABNORMAL HIGH (ref 0.350–4.500)

## 2020-06-07 LAB — GLUCOSE, CAPILLARY
Glucose-Capillary: 168 mg/dL — ABNORMAL HIGH (ref 70–99)
Glucose-Capillary: 156 mg/dL — ABNORMAL HIGH (ref 70–99)
Glucose-Capillary: 167 mg/dL — ABNORMAL HIGH (ref 70–99)

## 2020-06-07 LAB — ECHOCARDIOGRAM COMPLETE
AR max vel: 0.71 cm2
AV Area VTI: 0.84 cm2
AV Area mean vel: 0.71 cm2
AV Mean grad: 35 mmHg
AV Peak grad: 66.3 mmHg
Ao pk vel: 4.07 m/s
Area-P 1/2: 2.76 cm2
Height: 64 in
P 1/2 time: 448 msec
S' Lateral: 2.8 cm
Weight: 3622.6 oz

## 2020-06-07 LAB — BRAIN NATRIURETIC PEPTIDE: B Natriuretic Peptide: 489.2 pg/mL — ABNORMAL HIGH (ref 0.0–100.0)

## 2020-06-07 LAB — POC OCCULT BLOOD, ED: Fecal Occult Bld: POSITIVE — AB

## 2020-06-07 LAB — PREPARE RBC (CROSSMATCH)

## 2020-06-07 MED ORDER — HYDRALAZINE HCL 50 MG PO TABS
100.0000 mg | ORAL_TABLET | Freq: Three times a day (TID) | ORAL | Status: DC
  Administered 2020-06-07 – 2020-06-11 (×12): 100 mg via ORAL
  Filled 2020-06-07 (×14): qty 2

## 2020-06-07 MED ORDER — CAMPHOR-MENTHOL 0.5-0.5 % EX LOTN
1.0000 "application " | TOPICAL_LOTION | Freq: Three times a day (TID) | CUTANEOUS | Status: DC | PRN
  Filled 2020-06-07: qty 222

## 2020-06-07 MED ORDER — SODIUM CHLORIDE 0.9 % IV SOLN
10.0000 mL/h | Freq: Once | INTRAVENOUS | Status: AC
  Administered 2020-06-07: 10 mL/h via INTRAVENOUS

## 2020-06-07 MED ORDER — AMIODARONE HCL 200 MG PO TABS
200.0000 mg | ORAL_TABLET | Freq: Every day | ORAL | Status: DC
  Administered 2020-06-07 – 2020-06-11 (×5): 200 mg via ORAL
  Filled 2020-06-07 (×5): qty 1

## 2020-06-07 MED ORDER — ACETAMINOPHEN 650 MG RE SUPP: 650.0000 mg | Freq: Four times a day (QID) | RECTAL | Status: DC | PRN

## 2020-06-07 MED ORDER — ACETAMINOPHEN 325 MG PO TABS: 650.0000 mg | ORAL_TABLET | Freq: Four times a day (QID) | ORAL | Status: DC | PRN

## 2020-06-07 MED ORDER — ONDANSETRON HCL 4 MG PO TABS: 4.0000 mg | ORAL_TABLET | Freq: Four times a day (QID) | ORAL | Status: DC | PRN

## 2020-06-07 MED ORDER — NEPRO/CARBSTEADY PO LIQD: 237.0000 mL | Freq: Three times a day (TID) | ORAL | Status: DC | PRN

## 2020-06-07 MED ORDER — SPIRONOLACTONE 25 MG PO TABS
25.0000 mg | ORAL_TABLET | Freq: Every day | ORAL | Status: DC
  Administered 2020-06-07 – 2020-06-09 (×3): 25 mg via ORAL
  Filled 2020-06-07 (×3): qty 1

## 2020-06-07 MED ORDER — CLONIDINE HCL 0.1 MG PO TABS
0.1000 mg | ORAL_TABLET | Freq: Two times a day (BID) | ORAL | Status: DC
  Administered 2020-06-07 – 2020-06-11 (×7): 0.1 mg via ORAL
  Filled 2020-06-07 (×8): qty 1

## 2020-06-07 MED ORDER — ALLOPURINOL 300 MG PO TABS
300.0000 mg | ORAL_TABLET | Freq: Every day | ORAL | Status: DC
  Administered 2020-06-07 – 2020-06-10 (×4): 300 mg via ORAL
  Filled 2020-06-07 (×4): qty 1

## 2020-06-07 MED ORDER — SILDENAFIL CITRATE 20 MG PO TABS
20.0000 mg | ORAL_TABLET | Freq: Three times a day (TID) | ORAL | Status: DC
  Administered 2020-06-07 – 2020-06-11 (×13): 20 mg via ORAL
  Filled 2020-06-07 (×13): qty 1

## 2020-06-07 MED ORDER — DOXAZOSIN MESYLATE 8 MG PO TABS
8.0000 mg | ORAL_TABLET | Freq: Every day | ORAL | Status: DC
  Administered 2020-06-07 – 2020-06-11 (×5): 8 mg via ORAL
  Filled 2020-06-07 (×5): qty 1

## 2020-06-07 MED ORDER — ONDANSETRON HCL 4 MG/2ML IJ SOLN: 4.0000 mg | Freq: Four times a day (QID) | INTRAMUSCULAR | Status: DC | PRN

## 2020-06-07 MED ORDER — LEVOTHYROXINE SODIUM 25 MCG PO TABS: 125.0000 ug | ORAL_TABLET | Freq: Every day | ORAL | Status: DC

## 2020-06-07 MED ORDER — LEVOTHYROXINE SODIUM 75 MCG PO TABS
150.0000 ug | ORAL_TABLET | Freq: Every day | ORAL | Status: DC
  Administered 2020-06-07 – 2020-06-11 (×5): 150 ug via ORAL
  Filled 2020-06-07 (×6): qty 2

## 2020-06-07 MED ORDER — AMLODIPINE BESYLATE 10 MG PO TABS
10.0000 mg | ORAL_TABLET | Freq: Every day | ORAL | Status: DC
  Administered 2020-06-07 – 2020-06-11 (×5): 10 mg via ORAL
  Filled 2020-06-07 (×5): qty 1

## 2020-06-07 MED ORDER — POLYETHYLENE GLYCOL 3350 17 G PO PACK: 17.0000 g | PACK | Freq: Every day | ORAL | Status: DC | PRN

## 2020-06-07 MED ORDER — FUROSEMIDE 10 MG/ML IJ SOLN
40.0000 mg | Freq: Two times a day (BID) | INTRAMUSCULAR | Status: DC
  Administered 2020-06-07 – 2020-06-09 (×4): 40 mg via INTRAVENOUS
  Filled 2020-06-07 (×4): qty 4

## 2020-06-07 MED ORDER — ATORVASTATIN CALCIUM 40 MG PO TABS
40.0000 mg | ORAL_TABLET | Freq: Every day | ORAL | Status: DC
  Administered 2020-06-07 – 2020-06-11 (×5): 40 mg via ORAL
  Filled 2020-06-07 (×5): qty 1

## 2020-06-07 MED ORDER — INSULIN ASPART 100 UNIT/ML ~~LOC~~ SOLN
0.0000 [IU] | Freq: Three times a day (TID) | SUBCUTANEOUS | Status: DC
  Administered 2020-06-07 – 2020-06-08 (×3): 2 [IU] via SUBCUTANEOUS
  Administered 2020-06-08 – 2020-06-09 (×3): 1 [IU] via SUBCUTANEOUS
  Administered 2020-06-09 – 2020-06-11 (×4): 2 [IU] via SUBCUTANEOUS

## 2020-06-07 MED ORDER — HYDROXYZINE HCL 25 MG PO TABS
25.0000 mg | ORAL_TABLET | Freq: Three times a day (TID) | ORAL | Status: DC | PRN
  Administered 2020-06-07 – 2020-06-10 (×4): 25 mg via ORAL
  Filled 2020-06-07 (×4): qty 1

## 2020-06-07 MED ORDER — LATANOPROST 0.005 % OP SOLN
1.0000 [drp] | Freq: Every day | OPHTHALMIC | Status: DC
  Administered 2020-06-07 – 2020-06-10 (×4): 1 [drp] via OPHTHALMIC
  Filled 2020-06-07: qty 2.5

## 2020-06-07 MED ORDER — INSULIN GLARGINE 100 UNIT/ML ~~LOC~~ SOLN
18.0000 [IU] | Freq: Every day | SUBCUTANEOUS | Status: DC
  Administered 2020-06-07 – 2020-06-10 (×4): 18 [IU] via SUBCUTANEOUS
  Filled 2020-06-07 (×5): qty 0.18

## 2020-06-07 MED ORDER — OXYCODONE HCL 5 MG PO TABS: 5.0000 mg | ORAL_TABLET | ORAL | Status: DC | PRN

## 2020-06-07 MED ORDER — CALCIUM CARBONATE ANTACID 1250 MG/5ML PO SUSP
500.0000 mg | Freq: Four times a day (QID) | ORAL | Status: DC | PRN
  Filled 2020-06-07: qty 5

## 2020-06-07 MED ORDER — FUROSEMIDE 10 MG/ML IJ SOLN
60.0000 mg | Freq: Once | INTRAMUSCULAR | Status: AC
  Administered 2020-06-07: 60 mg via INTRAVENOUS
  Filled 2020-06-07: qty 6

## 2020-06-07 MED ORDER — ZOLPIDEM TARTRATE 5 MG PO TABS
5.0000 mg | ORAL_TABLET | Freq: Every evening | ORAL | Status: DC | PRN
  Administered 2020-06-07 – 2020-06-10 (×4): 5 mg via ORAL
  Filled 2020-06-07 (×4): qty 1

## 2020-06-07 MED ORDER — ADULT MULTIVITAMIN W/MINERALS CH
1.0000 | ORAL_TABLET | Freq: Every day | ORAL | Status: DC
  Administered 2020-06-07 – 2020-06-11 (×5): 1 via ORAL
  Filled 2020-06-07 (×5): qty 1

## 2020-06-07 MED ORDER — PROSOURCE PLUS PO LIQD
30.0000 mL | Freq: Two times a day (BID) | ORAL | Status: DC
  Administered 2020-06-07 – 2020-06-11 (×7): 30 mL via ORAL
  Filled 2020-06-07 (×8): qty 30

## 2020-06-07 MED ORDER — DOCUSATE SODIUM 100 MG PO CAPS
100.0000 mg | ORAL_CAPSULE | Freq: Two times a day (BID) | ORAL | Status: DC
  Administered 2020-06-07 – 2020-06-11 (×9): 100 mg via ORAL
  Filled 2020-06-07 (×9): qty 1

## 2020-06-07 MED ORDER — SORBITOL 70 % SOLN: 30.0000 mL | Status: DC | PRN

## 2020-06-07 MED ORDER — CARVEDILOL 12.5 MG PO TABS
12.5000 mg | ORAL_TABLET | Freq: Two times a day (BID) | ORAL | Status: DC
  Administered 2020-06-07 – 2020-06-11 (×9): 12.5 mg via ORAL
  Filled 2020-06-07 (×9): qty 1

## 2020-06-07 MED ORDER — BISACODYL 5 MG PO TBEC: 5.0000 mg | DELAYED_RELEASE_TABLET | Freq: Every day | ORAL | Status: DC | PRN

## 2020-06-07 MED ORDER — ENOXAPARIN SODIUM 30 MG/0.3ML ~~LOC~~ SOLN
30.0000 mg | SUBCUTANEOUS | Status: DC
  Administered 2020-06-07: 30 mg via SUBCUTANEOUS
  Filled 2020-06-07: qty 0.3

## 2020-06-07 MED ORDER — SODIUM CHLORIDE 0.9% FLUSH
3.0000 mL | Freq: Two times a day (BID) | INTRAVENOUS | Status: DC
  Administered 2020-06-07 – 2020-06-11 (×9): 3 mL via INTRAVENOUS

## 2020-06-07 MED ORDER — MORPHINE SULFATE (PF) 2 MG/ML IV SOLN: 2.0000 mg | INTRAVENOUS | Status: DC | PRN

## 2020-06-07 MED ORDER — HYDRALAZINE HCL 20 MG/ML IJ SOLN: 5.0000 mg | INTRAMUSCULAR | Status: DC | PRN

## 2020-06-07 MED ORDER — DOCUSATE SODIUM 283 MG RE ENEM
1.0000 | ENEMA | RECTAL | Status: DC | PRN
  Filled 2020-06-07: qty 1

## 2020-06-07 NOTE — Plan of Care (Signed)
  Problem: Education: Goal: Ability to demonstrate management of disease process will improve 06/07/2020 1213 by Arlina Robes, RN Outcome: Progressing 06/07/2020 1209 by Arlina Robes, RN Outcome: Progressing 06/07/2020 1209 by Arlina Robes, RN Outcome: Progressing Goal: Ability to verbalize understanding of medication therapies will improve 06/07/2020 1213 by Arlina Robes, RN Outcome: Progressing 06/07/2020 1209 by Arlina Robes, RN Outcome: Progressing 06/07/2020 1209 by Arlina Robes, RN Outcome: Progressing Goal: Individualized Educational Video(s) 06/07/2020 1213 by Arlina Robes, RN Outcome: Progressing 06/07/2020 1209 by Arlina Robes, RN Outcome: Progressing 06/07/2020 1209 by Arlina Robes, RN Outcome: Progressing   Problem: Activity: Goal: Capacity to carry out activities will improve 06/07/2020 1213 by Arlina Robes, RN Outcome: Progressing 06/07/2020 1209 by Arlina Robes, RN Outcome: Progressing 06/07/2020 1209 by Arlina Robes, RN Outcome: Progressing   Problem: Cardiac: Goal: Ability to achieve and maintain adequate cardiopulmonary perfusion will improve 06/07/2020 1213 by Arlina Robes, RN Outcome: Progressing 06/07/2020 1209 by Arlina Robes, RN Outcome: Progressing 06/07/2020 1209 by Arlina Robes, RN Outcome: Progressing

## 2020-06-07 NOTE — Plan of Care (Signed)
  Problem: Education: Goal: Ability to demonstrate management of disease process will improve 06/07/2020 1209 by Arlina Robes, RN Outcome: Progressing 06/07/2020 1209 by Arlina Robes, RN Outcome: Progressing Goal: Ability to verbalize understanding of medication therapies will improve 06/07/2020 1209 by Arlina Robes, RN Outcome: Progressing 06/07/2020 1209 by Arlina Robes, RN Outcome: Progressing Goal: Individualized Educational Video(s) 06/07/2020 1209 by Arlina Robes, RN Outcome: Progressing 06/07/2020 1209 by Arlina Robes, RN Outcome: Progressing   Problem: Activity: Goal: Capacity to carry out activities will improve 06/07/2020 1209 by Arlina Robes, RN Outcome: Progressing 06/07/2020 1209 by Arlina Robes, RN Outcome: Progressing   Problem: Cardiac: Goal: Ability to achieve and maintain adequate cardiopulmonary perfusion will improve 06/07/2020 1209 by Arlina Robes, RN Outcome: Progressing 06/07/2020 1209 by Arlina Robes, RN Outcome: Progressing

## 2020-06-07 NOTE — Consult Note (Signed)
Consultation Note Date: 06/07/2020   Patient Name: Patricia Mata  DOB: 07-Sep-1955  MRN: [id number]  Age / Sex: 64 y.o., female  PCP: [name], [place] Referring Physician: Karmen Bongo, MD  Reason for Consultation: Establishing goals of care  HPI/Patient Profile: 65 y.o. female  with past medical history of chronic combined CHF, stage IV CKD (baseline creatinine 2.7-3), pulmonary HTN, severe aortic stenosis, DM2, HTN, remote colon cancer, and current MDS with transfusion-dependent anemia. She presented to the emergency department on 06/07/2020 with shortness of breath x 1-2 days, worse with activity. Also reports recent weight gain. ED Course: Creatinine 3.21, BNP 489, hemoglobin 7.4, positive FOBT. Chest x-ray consistent with mild pulmonary edema. Given lasix and blood. Admitted to [facility] for acute respiratory failure with hypoxia, acute on chronic CHF, and anemia.   Clinical Assessment and Goals of Care: I have reviewed medical records including EPIC notes, labs and imaging, and met at bedside with patient and son (Deporres) to discuss diagnosis, prognosis, GOC, EOL wishes, disposition, and options.  I introduced Palliative Medicine as specialized medical care for people living with serious illness. It focuses on providing relief from the symptoms and stress of a serious illness.   We discussed a brief life review of the patient. 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 She loved her work but had to retire in 2019 due to health issues. She has 1 son, Deporres, who lives in [place] and works for the [employer], trade [employer]. Her first husband passed away from a large stroke. She has been re-married for 20 years.   Patient lives with her husband. She states she "loves life". As far as functional status, she is ambulatory and independent with all ADLs. Her husband does the  cooking and cleaning.   We discussed her current illness and what it means in the larger context of her ongoing co-morbidities.  Natural disease trajectory of heart failure and chronic illness was discussed.  I attempted to elicit values and goals of care important to the patient. Her family and faith are the most important aspects of her life. Patient and son speak openly of their strong faith in God, and that his will is in control of her life.   The difference between aggressive medical intervention and comfort care was considered in light of the patient's goals of care. The patient is interested in all aggressive medical interventions offered. Patient wishes to continue enjoying life as long as possible. We did discuss quality of life. She share that with her fist husband, they had to make the decision to remove ventilator support because he had no quality of life. She states she would not want her life prolonged if there was no quality of life or chance for meaningful recovery.    We did discuss code status. Patient and son reference her hospitalization in 2019. They express had they not opted for aggressive interventions and full code status in 2019, she would not be here today. Patient wishes to remain full  code at this time.      Advanced directives, concepts specific to code status, artifical feeding and hydration, and rehospitalization were considered and discussed. MOST form and advanced directive packet were reviewed and explained. Patient is open to completing these at a later time when she feels better  Questions and concerns were addressed.  The patient and family was encouraged to call with questions or concerns.    Primary decision maker: Patient. She states her surrogate decision maker(s) would be her husband, son, and sister (working together)    [place]   - full code full scope treatment - goal of care is to improve and return home  - patient and family  interested in all aggressive interventions offered - patient would not want to be prolonged artificially if she had no quality of life or chance for meaningful recovery - I have encouraged her to complete a living will to document this wish - PMT will continue to follow  Code Status/Advance Care Planning:  Full code  Symptom Management:   Per primary team  Additional Recommendations (Limitations, Scope, Preferences):  Full Scope Treatment  Psycho-social/Spiritual:   Created space and opportunity for patient and family to express thoughts and feelings regarding patient's current medical situation.   Emotional support provided   Prognosis:   Unable to determine  Discharge Planning: To Be Determined      Primary Diagnoses: Present on Admission: . Acute on chronic combined systolic (congestive) and diastolic (congestive) heart failure ([place])   I have reviewed the medical record, interviewed the patient and family, and examined the patient. The following aspects are pertinent.  Past Medical History:  Diagnosis Date  . Acute renal failure (ARF) ([place]) 06/08/2018  . Cancer [facility]) 2008   Colon   . Chronic diastolic (congestive) heart failure ([place])   . Diabetes mellitus without complication ([place])   . Gout 09/08/2018  . Hypertension   . Macrocytic anemia   . MDS (myelodysplastic syndrome) ([place])   . Stage 4 chronic kidney disease ([facility])     Family History  Problem Relation Age of Onset  . Hypertension Mother   . Diabetes Mother   . Cervical cancer Mother   . Heart attack Father   . Hypertension Sister   . Hypertension Brother   . Hypertension Sister   . Hypertension Sister   . Prostate cancer Brother   . HIV/AIDS Brother    Scheduled Meds: . [START ON 06/08/2020] (feeding supplement) PROSource Plus  30 mL Oral BID BM  . docusate sodium  100 mg Oral BID  . enoxaparin (LOVENOX) injection  30 mg Subcutaneous Q24H  . furosemide  40 mg Intravenous BID  . insulin aspart   0-9 Units Subcutaneous TID WC  . multivitamin with minerals  1 tablet Oral Daily  . sodium chloride flush  3 mL Intravenous Q12H  . spironolactone  25 mg Oral Daily   Continuous Infusions: PRN Meds:.acetaminophen **OR** acetaminophen, bisacodyl, calcium carbonate (dosed in mg elemental calcium), camphor-menthol **AND** hydrOXYzine, docusate sodium, feeding supplement (NEPRO CARB STEADY), hydrALAZINE, morphine injection, ondansetron **OR** ondansetron (ZOFRAN) IV, oxyCODONE, polyethylene glycol, sorbitol, zolpidem Medications Prior to Admission:  Prior to Admission medications   Medication Sig Start Date End Date Taking? Authorizing Provider  allopurinol (ZYLOPRIM) 300 MG tablet Take 300 mg by mouth daily. 06/04/20  Yes [provider]  atorvastatin (LIPITOR) 40 MG tablet Take 1 tablet (40 mg total) by mouth daily. 12/16/19  Yes [name], FNP  cloNIDine (CATAPRES) 0.1  MG tablet Take 1 tablet (0.1 mg total) by mouth 2 (two) times daily. 11/30/19  Yes Bensimhon, Shaune Pascal, MD  hydrALAZINE (APRESOLINE) 100 MG tablet Take 1 tablet (100 mg total) by mouth 3 (three) times daily. 02/22/20  Yes Skeet Latch, MD  levothyroxine (SYNTHROID) 125 MCG tablet Take 1 tablet (125 mcg total) by mouth daily. 05/05/20 05/05/21 Yes [name], FNP  linagliptin (TRADJENTA) 5 MG TABS tablet Take 1 tablet (5 mg total) by mouth daily. 02/03/20  Yes [name], FNP  Multiple Vitamins-Minerals (CENTRUM SILVER 50+WOMEN) TABS Take 1 tablet by mouth daily.   Yes [provider]  albuterol (PROAIR HFA) 108 (90 Base) MCG/ACT inhaler Inhale 2 puffs into the lungs every 6 (six) hours as needed for wheezing or shortness of breath. 01/28/20   Collene Gobble, MD  allopurinol (ZYLOPRIM) 100 MG tablet Take 300 mg by mouth daily.  05/07/19   [provider]  amiodarone (PACERONE) 200 MG tablet Take 1 tablet (200 mg total) by mouth daily. 04/10/20   Bensimhon, Shaune Pascal, MD  amLODipine (NORVASC) 10 MG  tablet Take 1 tablet (10 mg total) by mouth daily. 03/22/20   Larey Dresser, MD  blood glucose meter kit and supplies Dispense based on patient and insurance preference. Use up to four times daily as directed. (FOR ICD-10 E10.9, E11.9). Patient taking differently: 1 each by Other route See admin instructions. Dispense based on patient and insurance preference. Use up to four times daily as directed. (FOR ICD-10 E10.9, E11.9). 11/04/18   [name], FNP  Blood Glucose Monitoring Suppl (ONETOUCH ULTRALINK) w/Device KIT Check blood sugars twice daily E11.9 Patient taking differently: 1 each by Other route See admin instructions. Check blood sugars twice daily E11.9 11/30/19   [name], FNP  carvedilol (COREG) 12.5 MG tablet Take 1 tablet (12.5 mg total) by mouth 2 (two) times daily. 05/22/20 08/20/20  Skeet Latch, MD  colchicine 0.6 MG tablet Take 0.6 mg by mouth as directed. Take 1/2 tablet twice a day as needed for gout    [provider]  dextromethorphan-guaiFENesin (MUCINEX DM) 30-600 MG 12hr tablet Take 1 tablet by mouth 2 (two) times daily. 01/28/20   Collene Gobble, MD  doxazosin (CARDURA) 2 MG tablet Take 2 mg by mouth at bedtime. 03/21/20   [provider]  doxazosin (CARDURA) 8 MG tablet Take 1 tablet (8 mg total) by mouth daily. 04/27/20   Skeet Latch, MD  fluticasone [facility]) 50 MCG/ACT nasal spray Place 1 spray into both nostrils daily. 01/28/20   Collene Gobble, MD  furosemide (LASIX) 40 MG tablet Take 1 tablet by mouth once daily Patient taking differently: Take 40 mg by mouth daily.  02/23/20   [name], FNP  glucose blood test strip check blood sugars twice daily Dx code E11.22 Patient taking differently: 1 each by Other route See admin instructions. check blood sugars twice daily Dx code E11.22 03/02/20   [name], FNP  Insulin Glargine (BASAGLAR KWIKPEN) 100 UNIT/ML Inject 0.14 mLs (14 Units total) into the skin daily. Patient taking  differently: Inject 16 Units into the skin daily.  02/03/20   [name], FNP  Insulin Pen Needle 29G X 5MM MISC Use as directed Patient taking differently: 1 each by Other route See admin instructions. Use as directed 06/16/17   Bonnielee Haff, MD  Lancets (ONETOUCH DELICA PLUS [id number]) [place] check blood sugars twice daily Dx code: E11.22 Patient taking differently: 1 each by Other route See admin  instructions. check blood sugars twice daily Dx code: E11.22 03/02/20   [name], FNP  latanoprost (XALATAN) 0.005 % ophthalmic solution Place 1 drop into both eyes at bedtime.     [provider]  loratadine (CLARITIN) 10 MG tablet Take 1 tablet (10 mg total) by mouth daily. 05/04/20   Collene Gobble, MD  oxymetazoline (AFRIN) 0.05 % nasal spray Place 1 spray into both nostrils 2 (two) times daily as needed for congestion.    [provider]  pantoprazole (PROTONIX) 40 MG tablet Take 1 tablet (40 mg total) by mouth daily. 05/04/20   Collene Gobble, MD  sildenafil (REVATIO) 20 MG tablet TAKE 1 TABLET BY MOUTH THREE TIMES DAILY Patient taking differently: Take 20 mg by mouth 3 (three) times daily.  05/29/20   Bensimhon, Shaune Pascal, MD  valACYclovir (VALTREX) 500 MG tablet Take 1 tablet by mouth two times a day Patient taking differently: Take 500 mg by mouth 2 (two) times daily.  05/11/20   [name], FNP   Allergies  Allergen Reactions  . Ancef [Cefazolin] Itching    Severe itching- after procedure, ancef was the antibiotic.-04/02/17 Tolerates penicillins  . Lactose Intolerance (Gi) Diarrhea  . Sulfa Antibiotics Rash and Other (See Comments)    Blisters, also   Review of Systems  Constitutional: Positive for fatigue.  Respiratory: Positive for shortness of breath.   Neurological: Positive for weakness.    Physical Exam Constitutional:      General: She is not in acute distress.    Comments: Chronically ill-appearing  Cardiovascular:     Rate and Rhythm: Normal  rate.  Pulmonary:     Effort: Pulmonary effort is normal.  Musculoskeletal:     Right lower leg: Edema present.     Left lower leg: Edema present.  Neurological:     Mental Status: She is alert.     Vital Signs: BP (!) 186/60   Pulse 67   Temp 98 F (36.7 C) (Oral)   Resp 20   Ht '5\' 4"'  (1.626 m)   Wt 102.7 kg   SpO2 100%   BMI 38.86 kg/m  Pain Scale: 0-10   Pain Score: 0-No pain   SpO2: SpO2: 100 % O2 Device:SpO2: 100 % O2 Flow Rate: .O2 Flow Rate (L/min): 2 L/min  IO: Intake/output summary:   Intake/Output Summary (Last 24 hours) at 06/07/2020 1642 Last data filed at 06/07/2020 1057 Gross per 24 hour  Intake --  Output 325 ml  Net -325 ml    LBM: Last BM Date: 06/06/20 Baseline Weight: Weight: 97.5 kg Most recent weight: Weight: 102.7 kg      Palliative Assessment/Data: PPS 50%     Time In: 16:00 Time Out: 16:52 Time Total: 52 minutes Greater than 50%  of this time was spent counseling and coordinating care related to the above assessment and plan.  Signed by: Lavena Bullion, NP   Please contact Palliative Medicine Team phone at [phone] for questions and concerns.  For individual provider: See Shea Evans

## 2020-06-07 NOTE — Evaluation (Signed)
Physical Therapy Evaluation Patient Details Name: Patricia Mata MRN: [id number] DOB: July 30, 1955 Today's Date: 06/07/2020   History of Present Illness  Patient with hx chf, chronic anemia, AS, c/o increased sob in past day. Symptoms acute onset, mod-severe, constant, worse this AM, worse when supine/flat or w activity. Denies chest pain or discomfort. Occasional non prod cough. No sore throat or runny nose. No fever or body aches. No known ill contacts. Denies leg pain or swelling. States compliant w home meds including lasix. Denies decreased urine output. EMS notes initial pulse ox in 80's, improved to mid 90's on 2 liters [place].    Clinical Impression  Patient received in bed, initially she did not want to get up at all, but then agrees. Patient's son present in room. He reports she got complacent about not weighing herself regularly. She is not on home O2 at baseline. Patient currently on 2 lpm. She is mod independent with bed mobility and transfers. Ambulated with supervision to bathroom and back to bed on room air. SOB with this, although saturations at 91%. Patient will continue to benefit from skilled PT while here to improve functional independence and activity tolerance.        Follow Up Recommendations Home health PT    Equipment Recommendations  None recommended by PT    Recommendations for Other Services       Precautions / Restrictions Precautions Precautions: None Restrictions Weight Bearing Restrictions: No      Mobility  Bed Mobility Overal bed mobility: Modified Independent                  Transfers Overall transfer level: Modified independent                  Ambulation/Gait Ambulation/Gait assistance: Supervision Gait Distance (Feet): 25 Feet Assistive device: None Gait Pattern/deviations: Step-through pattern Gait velocity: wnl   General Gait Details: patient fatigued after walking to bathroom and back to bed. Ambulated on room air,  saturations down to 91%. Patient feeling SOB.  Stairs            Wheelchair Mobility    Modified Rankin (Stroke Patients Only)       Balance Overall balance assessment: Modified Independent                                           Pertinent Vitals/Pain Pain Assessment: No/denies pain    Home Living Family/patient expects to be discharged to:: Private residence Living Arrangements: Spouse/significant other Available Help at Discharge: Family;Available 24 hours/day Type of Home: House Home Access: Stairs to enter Entrance Stairs-Rails: Left;Right;Can reach both Technical brewer of Steps: 4 Home Layout: One level Home Equipment: Grab bars - tub/shower Additional Comments: drives, not using AD prior to admission    Prior Function Level of Independence: Independent         Comments: prior to hospital     Hand Dominance        Extremity/Trunk Assessment   Upper Extremity Assessment Upper Extremity Assessment: Overall WFL for tasks assessed    Lower Extremity Assessment Lower Extremity Assessment: Overall WFL for tasks assessed    Cervical / Trunk Assessment Cervical / Trunk Assessment: Normal  Communication   Communication: No difficulties  Cognition Arousal/Alertness: Awake/alert Behavior During Therapy: WFL for tasks assessed/performed Overall Cognitive Status: Within Functional Limits for tasks assessed  General Comments      Exercises     Assessment/Plan    PT Assessment Patient needs continued PT services  PT Problem List Decreased activity tolerance;Cardiopulmonary status limiting activity;Decreased mobility       PT Treatment Interventions Therapeutic activities;Therapeutic exercise;Gait training;Functional mobility training;Stair training;Patient/family education    PT Goals (Current goals can be found in the Care Plan section)  Acute Rehab PT  Goals Patient Stated Goal: to feel better, return home PT Goal Formulation: With patient/family Time For Goal Achievement: 06/21/20 Potential to Achieve Goals: Good    Frequency Min 3X/week   Barriers to discharge        Co-evaluation               AM-PAC PT "6 Clicks" Mobility  Outcome Measure Help needed turning from your back to your side while in a flat bed without using bedrails?: None Help needed moving from lying on your back to sitting on the side of a flat bed without using bedrails?: None Help needed moving to and from a bed to a chair (including a wheelchair)?: None Help needed standing up from a chair using your arms (e.g., wheelchair or bedside chair)?: None Help needed to walk in hospital room?: A Little Help needed climbing 3-5 steps with a railing? : A Lot 6 Click Score: 21    End of Session Equipment Utilized During Treatment: Oxygen Activity Tolerance: Patient tolerated treatment well Patient left: in bed;with call bell/phone within reach;with family/visitor present Nurse Communication: Mobility status PT Visit Diagnosis: Difficulty in walking, not elsewhere classified (R26.2)    Time: 1320-1340 PT Time Calculation (min) (ACUTE ONLY): 20 min   Charges:   PT Evaluation $PT Eval Moderate Complexity: 1 Mod PT Treatments $Gait Training: 8-22 mins        Simrit Gohlke, PT, GCS 06/07/20,2:21 PM

## 2020-06-07 NOTE — ED Provider Notes (Signed)
[place] [facility] EMERGENCY DEPARTMENT Provider Note   CSN: [id number] Arrival date & time: 06/07/20  0745     History Chief Complaint  Patient presents with  . Shortness of Breath    Patricia Mata is a 64 y.o. female.  Patient with hx chf, chronic anemia, AS, c/o increased sob in past day. Symptoms acute onset, mod-severe, constant, worse this AM, worse when supine/flat or w activity. Denies chest pain or discomfort. Occasional non prod cough. No sore throat or runny nose. No fever or body aches. No known ill contacts. Denies leg pain or swelling. States compliant w home meds including lasix. Denies decreased urine output. EMS notes initial pulse ox in 80's, improved to mid 90's on 2 liters [place].   The history is provided by the patient and the EMS personnel.  Shortness of Breath Associated symptoms: cough   Associated symptoms: no abdominal pain, no chest pain, no fever, no headaches, no neck pain, no rash, no sore throat and no vomiting        Past Medical History:  Diagnosis Date  . Acute kidney injury (nontraumatic) ([place])   . Acute renal failure (ARF) ([place]) 06/08/2018  . Cancer [facility]) 2008   Colon   . Diabetes mellitus without complication ([place])   . Gout 09/08/2018  . Hypertension   . Macrocytic anemia     Patient Active Problem List   Diagnosis Date Noted  . Chronic cough 05/04/2020  . Allergic rhinitis 03/23/2020  . Chronic kidney disease, stage 5 ([place]) 02/03/2020  . Purulent bronchitis ([place]) 01/28/2020  . Acute diastolic CHF (congestive heart failure) ([place]) 09/21/2019  . ARDS (adult respiratory distress syndrome) ([place])   . Acute respiratory failure with hypoxia ([place]) 09/20/2019  . HCAP (healthcare-associated pneumonia) 09/20/2019  . Pulmonary hypertension ([place]) 09/20/2019  . CKD (chronic kidney disease), stage III ([place]) 09/20/2019  . DM2 (diabetes mellitus, type 2) ([place]) 09/20/2019  . Elevated troponin 09/20/2019  . Severe sepsis ([place])  09/20/2019  . CAP (community acquired pneumonia) 09/14/2019  . Hypoxia 09/14/2019  . Neutropenia ([place]) 05/25/2019  . Malignant neoplasm of colon ([place]) 05/25/2019  . Bradycardia 03/21/2019  . Pneumonia due to COVID-19 virus 03/18/2019  . Type 2 diabetes mellitus with hemoglobin A1c goal of less than 7.0% ([place]) 12/10/2018  . Hypothyroidism 12/10/2018  . Seasonal allergies 12/10/2018  . Gout 09/08/2018  . 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 Sacral wound 06/08/2018  . Iron deficiency anemia due to chronic blood loss 06/08/2018  . Hyponatremia 06/08/2018  . Constipation 06/08/2018  . Encounter for nasogastric (NG) tube placement   . Pressure injury of skin 05/22/2018  . Acute hypoxemic respiratory failure ([place])   . Atrial fibrillation, transient ([place]) 05/11/2018  . Normocytic anemia 06/14/2017  . Severe aortic stenosis 06/10/2017  . Carotid artery disease ([place]) 06/10/2017  . Essential hypertension 05/13/2017  . Hyperlipidemia 05/13/2017  . Bilateral lower extremity edema 05/13/2017  . Port-A-Cath in place 04/28/2017  . 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 Goals of care, counseling/discussion 03/20/2017  . Anemia in chronic kidney disease 05/10/2016  . Anemia in stage 1 chronic kidney disease 05/10/2016  . Anemia due to chronic kidney disease 02/09/2016    Past Surgical History:  Procedure Laterality Date  . COLON SURGERY    . DEBRIDMENT OF DECUBITUS ULCER N/A 06/12/2018   Procedure: DEBRIDMENT OF DECUBITUS ULCER;  Surgeon: Wallace Going, DO;  Location: [facility]S;  Service: Plastics;  Laterality: N/A;  .  IR FLUORO GUIDE PORT INSERTION RIGHT  04/02/2017  . IR REMOVAL TUN ACCESS W/ PORT W/O FL MOD SED  03/16/2019  . IR US GUIDE VASC ACCESS RIGHT  04/02/2017  . RIGHT HEART CATH N/A 04/23/2019   Procedure: RIGHT HEART CATH;  Surgeon: Jolaine Artist, MD;  Location: [place] CV LAB;  Service: Cardiovascular;  Laterality: N/A;  . RIGHT/LEFT HEART  CATH AND CORONARY ANGIOGRAPHY N/A 05/21/2018   Procedure: RIGHT/LEFT HEART CATH AND CORONARY ANGIOGRAPHY;  Surgeon: Nelva Bush, MD;  Location: [facility] CV LAB;  Service: Cardiovascular;  Laterality: N/A;     OB History   No obstetric history on file.     Family History  Problem Relation Age of Onset  . Hypertension Mother   . Diabetes Mother   . Cervical cancer Mother   . Heart attack Father   . Hypertension Sister   . Hypertension Brother   . Hypertension Sister   . Hypertension Sister   . Prostate cancer Brother   . HIV/AIDS Brother     Social History   Tobacco Use  . Smoking status: Former Smoker    Packs/day: 0.25    Years: 20.00    Pack years: 5.00    Types: Cigarettes    Quit date: 01/31/2001    Years since quitting: 19.3  . Smokeless tobacco: Former Network engineer  . Vaping Use: Never used  Substance Use Topics  . Alcohol use: Yes    Alcohol/week: 2.0 standard drinks    Types: 2 Shots of liquor per week  . Drug use: Never    Home Medications Prior to Admission medications   Medication Sig Start Date End Date Taking? Authorizing Provider  albuterol (PROAIR HFA) 108 (90 Base) MCG/ACT inhaler Inhale 2 puffs into the lungs every 6 (six) hours as needed for wheezing or shortness of breath. 01/28/20   Collene Gobble, MD  allopurinol (ZYLOPRIM) 100 MG tablet Take 300 mg by mouth daily.  05/07/19   [provider]  allopurinol (ZYLOPRIM) 300 MG tablet Take 300 mg by mouth daily. 06/04/20   [provider]  amiodarone (PACERONE) 200 MG tablet Take 1 tablet (200 mg total) by mouth daily. 04/10/20   Bensimhon, Shaune Pascal, MD  amLODipine (NORVASC) 10 MG tablet Take 1 tablet (10 mg total) by mouth daily. 03/22/20   Larey Dresser, MD  atorvastatin (LIPITOR) 40 MG tablet Take 1 tablet (40 mg total) by mouth daily. 12/16/19   [name], FNP  blood glucose meter kit and supplies Dispense based on patient and insurance preference. Use up to four  times daily as directed. (FOR ICD-10 E10.9, E11.9). Patient taking differently: 1 each by Other route See admin instructions. Dispense based on patient and insurance preference. Use up to four times daily as directed. (FOR ICD-10 E10.9, E11.9). 11/04/18   [name], FNP  Blood Glucose Monitoring Suppl (ONETOUCH ULTRALINK) w/Device KIT Check blood sugars twice daily E11.9 Patient taking differently: 1 each by Other route See admin instructions. Check blood sugars twice daily E11.9 11/30/19   [name], FNP  carvedilol (COREG) 12.5 MG tablet Take 1 tablet (12.5 mg total) by mouth 2 (two) times daily. 05/22/20 08/20/20  Skeet Latch, MD  cloNIDine (CATAPRES) 0.1 MG tablet Take 1 tablet (0.1 mg total) by mouth 2 (two) times daily. 11/30/19   Bensimhon, Shaune Pascal, MD  colchicine 0.6 MG tablet Take 0.6 mg by mouth as directed. Take 1/2 tablet twice a day as needed for  gout    [provider]  dextromethorphan-guaiFENesin (MUCINEX DM) 30-600 MG 12hr tablet Take 1 tablet by mouth 2 (two) times daily. 01/28/20   Collene Gobble, MD  doxazosin (CARDURA) 2 MG tablet Take 2 mg by mouth at bedtime. 03/21/20   [provider]  doxazosin (CARDURA) 8 MG tablet Take 1 tablet (8 mg total) by mouth daily. 04/27/20   Skeet Latch, MD  fluticasone [facility]) 50 MCG/ACT nasal spray Place 1 spray into both nostrils daily. 01/28/20   Collene Gobble, MD  furosemide (LASIX) 40 MG tablet Take 1 tablet by mouth once daily Patient taking differently: Take 40 mg by mouth daily.  02/23/20   [name], FNP  glucose blood test strip check blood sugars twice daily Dx code E11.22 Patient taking differently: 1 each by Other route See admin instructions. check blood sugars twice daily Dx code E11.22 03/02/20   [name], FNP  hydrALAZINE (APRESOLINE) 100 MG tablet Take 1 tablet (100 mg total) by mouth 3 (three) times daily. 02/22/20   Skeet Latch, MD  Insulin Glargine [facility]) 100 UNIT/ML  Inject 0.14 mLs (14 Units total) into the skin daily. Patient taking differently: Inject 16 Units into the skin daily.  02/03/20   [name], FNP  Insulin Pen Needle 29G X 5MM MISC Use as directed Patient taking differently: 1 each by Other route See admin instructions. Use as directed 06/16/17   Bonnielee Haff, MD  Lancets (ONETOUCH DELICA PLUS [id number]) [place] check blood sugars twice daily Dx code: E11.22 Patient taking differently: 1 each by Other route See admin instructions. check blood sugars twice daily Dx code: E11.22 03/02/20   [name], FNP  latanoprost (XALATAN) 0.005 % ophthalmic solution Place 1 drop into both eyes at bedtime.     [provider]  levothyroxine (SYNTHROID) 125 MCG tablet Take 1 tablet (125 mcg total) by mouth daily. 05/05/20 05/05/21  [name], FNP  linagliptin (TRADJENTA) 5 MG TABS tablet Take 1 tablet (5 mg total) by mouth daily. 02/03/20   [name], FNP  loratadine (CLARITIN) 10 MG tablet Take 1 tablet (10 mg total) by mouth daily. 05/04/20   Collene Gobble, MD  Multiple Vitamins-Minerals (CENTRUM SILVER 50+WOMEN) TABS Take 1 tablet by mouth daily.    [provider]  oxymetazoline (AFRIN) 0.05 % nasal spray Place 1 spray into both nostrils 2 (two) times daily as needed for congestion.    [provider]  pantoprazole (PROTONIX) 40 MG tablet Take 1 tablet (40 mg total) by mouth daily. 05/04/20   Collene Gobble, MD  sildenafil (REVATIO) 20 MG tablet TAKE 1 TABLET BY MOUTH THREE TIMES DAILY Patient taking differently: Take 20 mg by mouth 3 (three) times daily.  05/29/20   Bensimhon, Shaune Pascal, MD  valACYclovir (VALTREX) 500 MG tablet Take 1 tablet by mouth two times a day Patient taking differently: Take 500 mg by mouth 2 (two) times daily.  05/11/20   [name], FNP    Allergies    Ancef [cefazolin], Lactose intolerance (gi), and Sulfa antibiotics  Review of Systems   Review of Systems  Constitutional: Negative  for chills and fever.  HENT: Negative for sore throat.   Eyes: Negative for redness.  Respiratory: Positive for cough and shortness of breath.   Cardiovascular: Negative for chest pain, palpitations and leg swelling.  Gastrointestinal: Negative for abdominal pain and vomiting.  Endocrine: Negative for polyuria.  Genitourinary: Negative for dysuria and flank pain.  Musculoskeletal: Negative  for back pain and neck pain.  Skin: Negative for rash.  Neurological: Negative for headaches.  Hematological: Does not bruise/bleed easily.  Psychiatric/Behavioral: Negative for confusion.    Physical Exam Updated Vital Signs BP (!) 135/51 (BP Location: Right Arm)   Pulse 70   Temp (!) 97.4 F (36.3 C) (Oral)   Resp 16   Ht 1.626 m ('5\' 4"' )   Wt 97.5 kg   SpO2 92%   BMI 36.90 kg/m   Physical Exam Vitals and nursing note reviewed.  Constitutional:      Appearance: She is well-developed.     Comments: In mild resp distress, tachypneic.   HENT:     Head: Atraumatic.     Nose: Nose normal.     Mouth/Throat:     Mouth: Mucous membranes are moist.  Eyes:     General: No scleral icterus.    Conjunctiva/sclera: Conjunctivae normal.  Neck:     Trachea: No tracheal deviation.  Cardiovascular:     Rate and Rhythm: Normal rate and regular rhythm.     Pulses: Normal pulses.     Heart sounds: Murmur heard.  No friction rub. No gallop.   Pulmonary:     Effort: Pulmonary effort is normal. No respiratory distress.     Breath sounds: Rales present.  Abdominal:     General: Bowel sounds are normal. There is no distension.     Palpations: Abdomen is soft.     Tenderness: There is no abdominal tenderness. There is no guarding.  Genitourinary:    Comments: No cva tenderness.  Musculoskeletal:        General: No swelling or tenderness.     Cervical back: Normal range of motion and neck supple. No rigidity. No muscular tenderness.     Right lower leg: No edema.     Left lower leg: No edema.    Skin:    General: Skin is warm and dry.     Findings: No rash.  Neurological:     Mental Status: She is alert.     Comments: Alert, speech normal.   Psychiatric:        Mood and Affect: Mood normal.      ED Results / Procedures / Treatments   Labs (all labs ordered are listed, but only abnormal results are displayed) Results for orders placed or performed during the hospital encounter of 06/07/20  CBC  Result Value Ref Range   WBC 7.1 4.0 - 10.5 K/uL   RBC 2.45 (L) 3.87 - 5.11 MIL/uL   Hemoglobin 7.4 (L) 12.0 - 15.0 g/dL   HCT 23.1 (L) 36 - 46 %   MCV 94.3 80.0 - 100.0 fL   MCH 30.2 26.0 - 34.0 pg   MCHC 32.0 30.0 - 36.0 g/dL   RDW 21.5 (H) 11.5 - 15.5 %   Platelets 178 150 - 400 K/uL   nRBC 2.7 (H) 0.0 - 0.2 %  Comprehensive metabolic panel  Result Value Ref Range   Sodium 133 (L) 135 - 145 mmol/L   Potassium 4.0 3.5 - 5.1 mmol/L   Chloride 102 98 - 111 mmol/L   CO2 16 (L) 22 - 32 mmol/L   Glucose, Bld 224 (H) 70 - 99 mg/dL   BUN 80 (H) 8 - 23 mg/dL   Creatinine, Ser 3.21 (H) 0.44 - 1.00 mg/dL   Calcium 9.1 8.9 - 10.3 mg/dL   Total Protein 7.0 6.5 - 8.1 g/dL   Albumin 3.6 3.5 - 5.0 g/dL  AST 31 15 - 41 U/L   ALT 19 0 - 44 U/L   Alkaline Phosphatase 75 38 - 126 U/L   Total Bilirubin 1.0 0.3 - 1.2 mg/dL   GFR, Estimated 15 (L) >60 mL/min   Anion gap 15 5 - 15   *Note: Due to a large number of results and/or encounters for the requested time period, some results have not been displayed. A complete set of results can be found in Results Review.   DG Chest Port 1 View  Result Date: 06/07/2020 CLINICAL DATA:  Shortness of breath EXAM: PORTABLE CHEST 1 VIEW COMPARISON:  12/07/2019 FINDINGS: Cardiac shadow is enlarged. Vascular congestion is identified increasing parenchymal opacity likely representing edema although acute multifocal pneumonia is not excluded. Small effusions are noted bilaterally. No bony abnormality is seen. Aortic atherosclerotic changes are noted.  IMPRESSION: Vascular congestion with bilateral airspace opacity likely related to underlying edema. Multifocal pneumonia cannot be totally excluded on the basis of this exam. Continued follow-up is recommended Electronically Signed   By: Inez Catalina M.D.   On: 06/07/2020 08:33   US Pelvic Complete With Transvaginal  Result Date: 05/30/2020 CLINICAL DATA:  Initial evaluation for abnormal vaginal bleeding. EXAM: TRANSABDOMINAL AND TRANSVAGINAL ULTRASOUND OF PELVIS TECHNIQUE: Both transabdominal and transvaginal ultrasound examinations of the pelvis were performed. Transabdominal technique was performed for global imaging of the pelvis including uterus, ovaries, adnexal regions, and pelvic cul-de-sac. It was necessary to proceed with endovaginal exam following the transabdominal exam to visualize the uterus, endometrium, and ovaries. COMPARISON:  Prior MRI from 06/09/2018 FINDINGS: Uterus Measurements: 6.0 x 2.8 x 5.6 cm = volume: 67 mL. Uterus is anteverted. Several partially calcified uterine fibroids are seen clustered about the uterine fundus, largest of which measures up to 3.8 cm in size. Endometrium Thickness: Approximately 7-8 mm. No focal abnormality visualized. Small amount of simple anechoic fluid present within the endometrial cavity. Right ovary Not visualized.  No adnexal mass. Left ovary Measurements: 2.1 x 1.2 x 1.2 cm = volume: 1.6 mL. Normal appearance/no adnexal mass. Other findings Small volume free fluid present within the pelvis. IMPRESSION: 1. Endometrial stripe thickened up to 7-8 mm, with small volume free fluid within the endometrial cavity. In the setting of post-menopausal bleeding, endometrial sampling is indicated to exclude carcinoma. If results are benign, sonohysterogram should be considered for focal lesion work-up. (Ref: Radiological Reasoning: Algorithmic Workup of Abnormal Vaginal Bleeding with Endovaginal Sonography and Sonohysterography. AJR 2008; [id number]). 2. Multiple  scattered calcified uterine fibroids as above. 3. Normal left ovary, with visualization of the right ovary. No adnexal mass. 4. Small volume free fluid within the pelvis. Electronically Signed   By: Jeannine Boga M.D.   On: 05/30/2020 19:09    ED ECG REPORT   Date: 06/07/2020  Rate: 71  Rhythm: normal sinus rhythm  QRS Axis: normal  Intervals: normal  ST/T Wave abnormalities: nonspecific ST changes  Conduction Disutrbances:none  Narrative Interpretation:   Old EKG Reviewed: unchanged  I have personally reviewed the EKG tracing   Radiology No results found.  Procedures Procedures (including critical care time)  Medications Ordered in ED Medications - No data to display  ED Course  I have reviewed the triage vital signs and the nursing notes.  Pertinent labs & imaging results that were available during my care of the patient were reviewed by me and considered in my medical decision making (see chart for details).    MDM Rules/Calculators/A&P  Iv ns. Continuous pulse ox and cardiac monitoring - sinus rhythm.   MDM Number of Diagnoses or Management Options   Amount and/or Complexity of Data Reviewed Clinical lab tests: ordered and reviewed Tests in the radiology section of CPT: ordered and reviewed Tests in the medicine section of CPT: ordered and reviewed Discussion of test results with the performing providers: yes Decide to obtain previous medical records or to obtain history from someone other than the patient: yes Obtain history from someone other than the patient: yes Review and summarize past medical records: yes Discuss the patient with other providers: yes Independent visualization of images, tracings, or specimens: yes  Risk of Complications, Morbidity, and/or Mortality Presenting problems: high Diagnostic procedures: high Management options: high   Stat labs, ecg, and cxr.  Reviewed nursing notes and prior charts for  additional history.   Labs reviewed/interpreted by me - CRF. k normal. hgb 7.   CXR reviewed/interpreted by me - vascular congestion. Lasix iv.   Given chronic transfusion dependent anemia, in setting chf, and AS, feel patient may benefit from prbc transfusion  - prbc ordered.   CRITICAL CARE RE: acute on chr combined chf with hypoxia/ARF, CKD/severe, sym anemia w chf, requiring emergent transfusion, severe AS Performed by: Mirna Mires Total critical care time: 45 minutes Critical care time was exclusive of separately billable procedures and treating other patients. Critical care was necessary to treat or prevent imminent or life-threatening deterioration. Critical care was time spent personally by me on the following activities: development of treatment plan with patient and/or surrogate as well as nursing, discussions with consultants, evaluation of patient's response to treatment, examination of patient, obtaining history from patient or surrogate, ordering and performing treatments and interventions, ordering and review of laboratory studies, ordering and review of radiographic studies, pulse oximetry and re-evaluation of patient's condition.    Final Clinical Impression(s) / ED Diagnoses Final diagnoses:  None    Rx / DC Orders ED Discharge Orders    None       Lajean Saver, MD 06/07/20 [phone]

## 2020-06-07 NOTE — Consult Note (Addendum)
Cardiology Consultation:   Patient ID: Patricia Mata; [id number]; 1955/10/26   Admit date: 06/07/2020 Date of Consult: 06/07/2020  Primary Care Provider: [name], FNP Primary Cardiologist: Dr. Gwenlyn Found, MD   Patient Profile:   Patricia Mata is a 64 y.o. female with a hx of severe aortic stenosis, moderate mitral stenosis, pulmonary hypertension followed by Dr. Haroldine Laws, paroxysmal atrial fibrillation not on anticoagulation, hypertension, diabetes, myelodysplasitc syndrome, transfusion dependent anemia, and prior colon cancer s/p hemicolectomy who is being seen today for the evaluation of CHF at the request of Dr. Lorin Mercy.  History of Present Illness:   Patricia Mata is a 64yo F with a hx as stated above who presented to [facility] 06/07/2020 with acute onset shortness of breath which began earlier this morning. She states that she woke to use the restroom around 0430 this morning and just "did not feel right, like anxiety". She went to the bathroom then went back to lay down. When she got up again several hours later, she states that she had acute onset of SOB with walking similar to how she felt prior to being hospitalized for PNA in 09/2019. She then called her son for transport to the ED.   Prior to today, she has been in her usual state of health without SOB, chest pain, palpitations, dizziness, LE edema, orthopnea or syncope. She states that she was actually doing so well from a HF standpoint recently that she stopped weighing herself regularly. She reports that last Thursday she tried to put on her jeans that used to fit however she was unable to fit in them. Her weight at last OV early 05/22/2020 with a weight at 218lb and a weight today at 226lb. She also reports recent issues with frequent epistaxis which she is concerned about.   In the ED, BNP found to be elevated at 489 with CXR consistent with vascular congestion with bilateral airspace opacities likely related to underlying  edema with multifocal pneumonia. Creatinine elevated at 3.21 with a baseline that appears to be in the 2.5-2.9 range.  She is on PTA Lasix 40 mg daily. Weight a last OV was 218lb with a weight today at 226lb? Also list is a weight at 215lb. She was given 1 dose of 40 mg IV Lasix in the ER and placed on IV 40 mg Lasix twice daily. Fecal occult tested positive for blood. She is currently receiving 1U PRBCs for a Hb at 7.4 with a baseline around 8.0. TSH found to be elevated at 10.361.   She is followed by Dr. Gwenlyn Found for her cardiology care. In 04/2018 she was admitted for atrial fibrillation with RVR and recurrent anemia.  She was subsequently converted to NSR on IV amiodarone. Echocardiogram revealed LVEF at 65 to 70% with G2 DD with moderate aortic stenosis with a mean gradient at 22 mmHg and a PASP at 93 mmHg. She was not felt to be a candidate for intervention on her aortic or mitral stenosis due to severe pulmonary hypertension and transfusion dependent myelodysplasia.  [place] 05/2018 revealed normal coronary arteries and severely elevated pulmonary pressures. She was referred to Dr. Haroldine Laws for severe PAH. She was started on sildenafil and diuresed with IV Lasix.  She continues to require transfusions in the outpatient setting and follows with Dr. Alen Blew.  Repeat right heart catheterization 04/23/2019 revealed mild pulmonary hypertension (RA 8, RV 53/3, PA 57/12, mean PA of 32, pulmonary capillary wedge 14, PVR 2.1 Wood units, cardiac output 8.4, cardiac index 4.5).  Repeat echocardiogram 09/2019 with normal LVEF at 60-65%, G2DD moderate PAH, mild to moderate MR, severe aortic stenosis with a mean gradient at 42 mmHg however notes per Dr. Haroldine Laws state that murmur sounds more moderate consistent with last cath.   She was admitted for pneumonia 09/2019 and treated with antibiotics.  Unfortunately 3 days after discharge she returned with hypoxic respiratory failure requiring intubation.  Acute on chronic heart  failure also felt to be contributing.  She last saw Dr. Haroldine Laws 11/2019 at which time her BP was elevated at 210/64.  She was started on clonidine 1 mg twice daily with improvement.  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 She was referred to the hypertension clinic on 05/22/2020 for evaluation and management.   Past Medical History:  Diagnosis Date  . Acute renal failure (ARF) ([place]) 06/08/2018  . Cancer [facility]) 2008   Colon   . Chronic diastolic (congestive) heart failure ([place])   . Diabetes mellitus without complication ([place])   . Gout 09/08/2018  . Hypertension   . Macrocytic anemia   . MDS (myelodysplastic syndrome) ([place])   . Stage 4 chronic kidney disease [facility])     Past Surgical History:  Procedure Laterality Date  . COLON SURGERY    . DEBRIDMENT OF DECUBITUS ULCER N/A 06/12/2018   Procedure: DEBRIDMENT OF DECUBITUS ULCER;  Surgeon: Wallace Going, DO;  Location: [facility] ORS;  Service: Plastics;  Laterality: N/A;  . IR FLUORO GUIDE PORT INSERTION RIGHT  04/02/2017  . IR REMOVAL TUN ACCESS W/ PORT W/O FL MOD SED  03/16/2019  . IR US GUIDE VASC ACCESS RIGHT  04/02/2017  . RIGHT HEART CATH N/A 04/23/2019   Procedure: RIGHT HEART CATH;  Surgeon: Jolaine Artist, MD;  Location: [place] CV LAB;  Service: Cardiovascular;  Laterality: N/A;  . RIGHT/LEFT HEART CATH AND CORONARY ANGIOGRAPHY N/A 05/21/2018   Procedure: RIGHT/LEFT HEART CATH AND CORONARY ANGIOGRAPHY;  Surgeon: Nelva Bush, MD;  Location: [place] CV LAB;  Service: Cardiovascular;  Laterality: N/A;     Prior to Admission medications   Medication Sig Start Date End Date Taking? Authorizing Provider  albuterol (PROAIR HFA) 108 (90 Base) MCG/ACT inhaler Inhale 2 puffs into the lungs every 6 (six) hours as needed for wheezing or shortness of breath. 01/28/20   Collene Gobble, MD  allopurinol (ZYLOPRIM) 100 MG tablet  Take 300 mg by mouth daily.  05/07/19   [provider]  allopurinol (ZYLOPRIM) 300 MG tablet Take 300 mg by mouth daily. 06/04/20   [provider]  amiodarone (PACERONE) 200 MG tablet Take 1 tablet (200 mg total) by mouth daily. 04/10/20   Bensimhon, Shaune Pascal, MD  amLODipine (NORVASC) 10 MG tablet Take 1 tablet (10 mg total) by mouth daily. 03/22/20   Larey Dresser, MD  atorvastatin (LIPITOR) 40 MG tablet Take 1 tablet (40 mg total) by mouth daily. 12/16/19   [name], FNP  blood glucose meter kit and supplies Dispense based on patient and insurance preference. Use up to four times daily as directed. (FOR ICD-10 E10.9, E11.9). Patient taking differently: 1 each by Other route See admin instructions. Dispense based on patient and insurance preference. Use up to four times daily as directed. (FOR ICD-10 E10.9, E11.9). 11/04/18   [name], FNP  Blood Glucose Monitoring Suppl [facility] ULTRALINK) w/Device KIT Check blood sugars twice daily E11.9 Patient  taking differently: 1 each by Other route See admin instructions. Check blood sugars twice daily E11.9 11/30/19   [name], FNP  carvedilol (COREG) 12.5 MG tablet Take 1 tablet (12.5 mg total) by mouth 2 (two) times daily. 05/22/20 08/20/20  Skeet Latch, MD  cloNIDine (CATAPRES) 0.1 MG tablet Take 1 tablet (0.1 mg total) by mouth 2 (two) times daily. 11/30/19   Bensimhon, Shaune Pascal, MD  colchicine 0.6 MG tablet Take 0.6 mg by mouth as directed. Take 1/2 tablet twice a day as needed for gout    [provider]  dextromethorphan-guaiFENesin (MUCINEX DM) 30-600 MG 12hr tablet Take 1 tablet by mouth 2 (two) times daily. 01/28/20   Collene Gobble, MD  doxazosin (CARDURA) 2 MG tablet Take 2 mg by mouth at bedtime. 03/21/20   [provider]  doxazosin (CARDURA) 8 MG tablet Take 1 tablet (8 mg total) by mouth daily. 04/27/20   Skeet Latch, MD  fluticasone [facility]) 50 MCG/ACT nasal spray Place 1 spray into  both nostrils daily. 01/28/20   Collene Gobble, MD  furosemide (LASIX) 40 MG tablet Take 1 tablet by mouth once daily Patient taking differently: Take 40 mg by mouth daily.  02/23/20   [name], FNP  glucose blood test strip check blood sugars twice daily Dx code E11.22 Patient taking differently: 1 each by Other route See admin instructions. check blood sugars twice daily Dx code E11.22 03/02/20   [name], FNP  hydrALAZINE (APRESOLINE) 100 MG tablet Take 1 tablet (100 mg total) by mouth 3 (three) times daily. 02/22/20   Skeet Latch, MD  Insulin Glargine [facility]) 100 UNIT/ML Inject 0.14 mLs (14 Units total) into the skin daily. Patient taking differently: Inject 16 Units into the skin daily.  02/03/20   [name], FNP  Insulin Pen Needle 29G X 5MM MISC Use as directed Patient taking differently: 1 each by Other route See admin instructions. Use as directed 06/16/17   Bonnielee Haff, MD  Lancets (ONETOUCH DELICA PLUS [id number]) [place] check blood sugars twice daily Dx code: E11.22 Patient taking differently: 1 each by Other route See admin instructions. check blood sugars twice daily Dx code: E11.22 03/02/20   [name], FNP  latanoprost (XALATAN) 0.005 % ophthalmic solution Place 1 drop into both eyes at bedtime.     [provider]  levothyroxine (SYNTHROID) 125 MCG tablet Take 1 tablet (125 mcg total) by mouth daily. 05/05/20 05/05/21  [name], FNP  linagliptin (TRADJENTA) 5 MG TABS tablet Take 1 tablet (5 mg total) by mouth daily. 02/03/20   [name], FNP  loratadine (CLARITIN) 10 MG tablet Take 1 tablet (10 mg total) by mouth daily. 05/04/20   Collene Gobble, MD  Multiple Vitamins-Minerals (CENTRUM SILVER 50+WOMEN) TABS Take 1 tablet by mouth daily.    [provider]  oxymetazoline (AFRIN) 0.05 % nasal spray Place 1 spray into both nostrils 2 (two) times daily as needed for congestion.    [provider]  pantoprazole (PROTONIX)  40 MG tablet Take 1 tablet (40 mg total) by mouth daily. 05/04/20   Collene Gobble, MD  sildenafil (REVATIO) 20 MG tablet TAKE 1 TABLET BY MOUTH THREE TIMES DAILY Patient taking differently: Take 20 mg by mouth 3 (three) times daily.  05/29/20   Bensimhon, Shaune Pascal, MD  valACYclovir (VALTREX) 500 MG tablet Take 1 tablet by mouth two times a day Patient taking differently: Take 500 mg by mouth 2 (two) times daily.  05/11/20  [name], FNP    Inpatient Medications: Scheduled Meds: . docusate sodium  100 mg Oral BID  . enoxaparin (LOVENOX) injection  30 mg Subcutaneous Q24H  . furosemide  40 mg Intravenous BID  . insulin aspart  0-9 Units Subcutaneous TID WC  . sodium chloride flush  3 mL Intravenous Q12H  . spironolactone  25 mg Oral Daily   Continuous Infusions: . sodium chloride     PRN Meds: acetaminophen **OR** acetaminophen, bisacodyl, calcium carbonate (dosed in mg elemental calcium), camphor-menthol **AND** hydrOXYzine, docusate sodium, feeding supplement (NEPRO CARB STEADY), hydrALAZINE, morphine injection, ondansetron **OR** ondansetron (ZOFRAN) IV, oxyCODONE, polyethylene glycol, sorbitol, zolpidem  Allergies:    Allergies  Allergen Reactions  . Ancef [Cefazolin] Itching    Severe itching- after procedure, ancef was the antibiotic.-04/02/17 Tolerates penicillins  . Lactose Intolerance (Gi) Diarrhea  . Sulfa Antibiotics Rash and Other (See Comments)    Blisters, also    Social History:   Social History   Socioeconomic History  . Marital status: Married    Spouse name: Not on file  . Number of children: Not on file  . Years of education: Not on file  . Highest education level: Not on file  Occupational History  . Occupation: retired  Tobacco Use  . Smoking status: Former Smoker    Packs/day: 0.25    Years: 20.00    Pack years: 5.00    Types: Cigarettes    Quit date: 01/31/2001    Years since quitting: 19.3  . Smokeless tobacco: Former Network engineer    . Vaping Use: Never used  Substance and Sexual Activity  . Alcohol use: Yes    Alcohol/week: 2.0 standard drinks    Types: 2 Shots of liquor per week  . Drug use: Never  . Sexual activity: Not on file  Other Topics Concern  . Not on file  Social History Narrative  . Not on file   Social Determinants of Health   Financial Resource Strain: Medium Risk  . Difficulty of Paying Living Expenses: Somewhat hard  Food Insecurity:   . Worried About Charity fundraiser in the Last Year: Not on file  . Ran Out of Food in the Last Year: Not on file  Transportation Needs:   . Lack of Transportation (Medical): Not on file  . Lack of Transportation (Non-Medical): Not on file  Physical Activity:   . Days of Exercise per Week: Not on file  . Minutes of Exercise per Session: Not on file  Stress:   . Feeling of Stress : Not on file  Social Connections:   . Frequency of Communication with Friends and Family: Not on file  . Frequency of Social Gatherings with Friends and Family: Not on file  . Attends Religious Services: Not on file  . Active Member of Clubs or Organizations: Not on file  . Attends Archivist Meetings: Not on file  . Marital Status: Not on file  Intimate Partner Violence:   . Fear of Current or Ex-Partner: Not on file  . Emotionally Abused: Not on file  . Physically Abused: Not on file  . Sexually Abused: Not on file    Family History:   Family History  Problem Relation Age of Onset  . Hypertension Mother   . Diabetes Mother   . Cervical cancer Mother   . Heart attack Father   . Hypertension Sister   . Hypertension Brother   . Hypertension Sister   . Hypertension  Sister   . Prostate cancer Brother   . HIV/AIDS Brother    Family Status:  Family Status  Relation Name Status  . MGM  Deceased  . MGF  Deceased  . PGM  Deceased  . PGF  Deceased  . Mother  Deceased  . Father  Deceased  . Sister  Alive  . Brother  Alive  . Sister  Alive  . Sister   Alive  . Sister  Alive  . Sister  Alive  . Brother  Alive  . Brother  Alive    ROS:  Please see the history of present illness.  All other ROS reviewed and negative.     Physical Exam/Data:   Vitals:   06/07/20 1000 06/07/20 1018 06/07/20 1100 06/07/20 1135  BP: (!) 134/41 (!) 105/52 (!) 158/56 (!) 152/47  Pulse: 67 69 68 72  Resp: '19 17 17 20  ' Temp:   98 F (36.7 C) (!) 97.4 F (36.3 C)  TempSrc:    Oral  SpO2: 94% 95% 94% 100%  Weight:    102.7 kg  Height:    '5\' 4"'  (1.626 m)    Intake/Output Summary (Last 24 hours) at 06/07/2020 1343 Last data filed at 06/07/2020 1057 Gross per 24 hour  Intake --  Output 325 ml  Net -325 ml   Filed Weights   06/07/20 0752 06/07/20 1135  Weight: 97.5 kg 102.7 kg   Body mass index is 38.86 kg/m.   General: Obese, NAD Neck: Negative for carotid bruits. No JVD Lungs:Clear to ausculation bilaterally. No wheezes, rales, or rhonchi. Breathing is unlabored. Cardiovascular: RRR with S1 S2. + AS murmur Abdomen: Soft, non-tender, non-distended. No obvious abdominal masses. Extremities: No edema. Radial pulses 2+ bilaterally Neuro: Alert and oriented. No focal deficits. No facial asymmetry. MAE spontaneously. Psych: Responds to questions appropriately with normal affect.    EKG:  The EKG was personally reviewed and demonstrates: 06/07/20 NSR with HR 71bpm  Telemetry:  Telemetry was personally reviewed and demonstrates: 06/07/20 NSR  Relevant CV Studies:  Echo 05/12/18: Study Conclusions   - Left ventricle: The cavity size was normal. There was moderate    concentric hypertrophy. Systolic function was vigorous. The    estimated ejection fraction was in the range of 65% to 70%. Wall    motion was normal; there were no regional wall motion    abnormalities. Features are consistent with a pseudonormal left    ventricular filling pattern, with concomitant abnormal relaxation    and increased filling pressure (grade 2 diastolic  dysfunction).    Doppler parameters are consistent with high ventricular filling    pressure.  - Aortic valve: Trileaflet; moderately thickened, moderately    calcified leaflets. Valve mobility was restricted. There was    moderate stenosis. Mean gradient (S): 22 mm Hg. VTI ratio of LVOT    to aortic valve: 0.37. Valve area (VTI): 1.28 cm^2. Valve area    (Vmax): 1.01 cm^2. Valve area (Vmean): 1.18 cm^2.  - Mitral valve: Calcified annulus. Moderate diffuse thickening,    calcification, and elongation. The findings are consistent with    mild to moderate stenosis. Deceleration time: 324 ms.  - Tricuspid valve: There was moderate regurgitation.  - Pulmonic valve: There was trivial regurgitation.  - Pulmonary arteries: PA peak pressure: 93 mm Hg (S).   Echo 09/21/19:  1. Left ventricular ejection fraction, by estimation, is 65 to 70%. The  left ventricle has hyperdynamic function. The left ventricle has no  regional wall motion abnormalities. Left ventricular diastolic parameters  are consistent with Grade II diastolic  dysfunction (pseudonormalization). Elevated left atrial pressure.   2. Right ventricular systolic function is normal. The right ventricular  size is normal. There is moderately elevated pulmonary artery systolic  pressure.   3. Left atrial size was severely dilated.   4. The mitral valve is rheumatic. Mild to moderate mitral valve  regurgitation. Mild to moderate mitral stenosis. The mean mitral valve  gradient is 9.4 mmHg with average heart rate of 81 bpm.   5. The aortic valve has an indeterminant number of cusps. Aortic valve  regurgitation is trivial. Severe aortic valve stenosis. Aortic valve area,  by VTI measures 0.82 cm. Aortic valve mean gradient measures 42.0 mmHg.  Aortic valve Vmax measures 4.32  m/s.   6. The inferior vena cava is normal in size with greater than 50%  respiratory variability, suggesting right atrial pressure of 3 mmHg.   7. Agitated saline  contrast bubble study was negative, with no evidence  of any interatrial shunt.   Comparison(s): Prior images reviewed side by side. Changes from prior  study are noted. Increased mitral gradients are partly explained by  increased heart rate and cardiac output.    LHC/RHC 05/21/18: Conclusions: 1. No angiographically significant coronary artery disease. 2. Severe pulmonary hypertension (mean PAP 62 mmHg; PVR 5.4 WU). 3. Mildly elevated left heart filling pressure. 4. Moderately elevated right heart filling pressure.  Right atrial waveform has an "M" or "W" appearance that suggests restrictive/contrictive physiology. 5. Moderate aortic stenosis. 6. Probably moderate mitral stenosis.  Transmitral gradient (12 mmHg) may be accentuated by mitral regurgitation and anemia.  Prominent v-waves also suggest a degree of mitral regurgitation. 7. Normal to supranormal Fick cardiac output/index.   Recommendations: 1. Anticipate restarting diuresis tomorrow, as renal function allows following contrast administration today. 2. Consider advanced heart failure consultation to assist with management of HFpEF and severe pulmonary hypertension.  I do not believe that valvular disease is the primary cause of the patient's symptoms. 3. Primary prevention of coronary artery disease. RA (mean): 14 mmHg RV (S/EDP): 110/15 mmHg PA (S/D, mean): 105/40 (62) mmHg PCWP (mean): 25 mmHg Ao sat: 91% PA sat: 53 Fick CO: 6.8 L/min Fick CI: 4.3 L/min/m^2 PVR: 5.4 Wood units   RHC 10/20 RA = 8 RV = 53/3 PA = 57/12 (32) PCW = 14 (v waves to 32) Fick cardiac output/index = 8.4/4.5 PVR = 2.1 WU Ao sat = 99% PA sat = 73%, 74% High SVC sat = 74%   Renal artery Dopplers 02/08/20: Right: Normal size right kidney. Abnormal right Resistive Index.         Normal cortical thickness of right kidney. Evidence of a         greater than 60% stenosis of the right renal artery (low end         of range based on peak  velocity). RRV flow present.  Left:  Normal size of left kidney. Abnormal left Resisitve Index.         Normal cortical thickness of the left kidney. 1-59% stenosis         of the left renal artery. LRV flow present.  Mesenteric:  70 to 99% stenosis in the celiac artery and superior mesenteric artery.    Laboratory Data:  Chemistry Recent Labs  Lab 06/07/20 0825  NA 133*  K 4.0  CL 102  CO2 16*  GLUCOSE 224*  BUN 80*  CREATININE  3.21*  CALCIUM 9.1  GFRNONAA 15*  ANIONGAP 15    Total Protein  Date Value Ref Range Status  06/07/2020 7.0 6.5 - 8.1 g/dL Final  05/11/2020 7.6 6.0 - 8.5 g/dL Final  05/26/2017 7.3 6.4 - 8.3 g/dL Final   Albumin  Date Value Ref Range Status  06/07/2020 3.6 3.5 - 5.0 g/dL Final  05/11/2020 4.4 3.8 - 4.8 g/dL Final  05/26/2017 3.2 (L) 3.5 - 5.0 g/dL Final   AST  Date Value Ref Range Status  06/07/2020 31 15 - 41 U/L Final  05/18/2020 21 15 - 41 U/L Final  05/26/2017 29 5 - 34 U/L Final   ALT  Date Value Ref Range Status  06/07/2020 19 0 - 44 U/L Final  05/18/2020 14 0 - 44 U/L Final  05/26/2017 23 0 - 55 U/L Final   Alkaline Phosphatase  Date Value Ref Range Status  06/07/2020 75 38 - 126 U/L Final  05/26/2017 157 (H) 40 - 150 U/L Final   Total Bilirubin  Date Value Ref Range Status  06/07/2020 1.0 0.3 - 1.2 mg/dL Final  05/18/2020 0.8 0.3 - 1.2 mg/dL Final  05/26/2017 2.19 (H) 0.20 - 1.20 mg/dL Final   Hematology Recent Labs  Lab 06/07/20 0825  WBC 7.1  RBC 2.45*  HGB 7.4*  HCT 23.1*  MCV 94.3  MCH 30.2  MCHC 32.0  RDW 21.5*  PLT 178   Cardiac EnzymesNo results for input(s): TROPONINI in the last 168 hours. No results for input(s): TROPIPOC in the last 168 hours.  BNP Recent Labs  Lab 06/07/20 0826  BNP 489.2*    DDimer No results for input(s): DDIMER in the last 168 hours. TSH:  Lab Results  Component Value Date   TSH 6.980 (H) 05/01/2020   Lipids: Lab Results  Component Value Date   CHOL 174 05/11/2020    HDL 84 05/11/2020   LDLCALC 79 05/11/2020   TRIG 58 05/11/2020   CHOLHDL 2.1 05/11/2020   HgbA1c: Lab Results  Component Value Date   HGBA1C 6.0 (H) 05/11/2020    Radiology/Studies:  DG Chest Port 1 View  Result Date: 06/07/2020 CLINICAL DATA:  Shortness of breath EXAM: PORTABLE CHEST 1 VIEW COMPARISON:  12/07/2019 FINDINGS: Cardiac shadow is enlarged. Vascular congestion is identified increasing parenchymal opacity likely representing edema although acute multifocal pneumonia is not excluded. Small effusions are noted bilaterally. No bony abnormality is seen. Aortic atherosclerotic changes are noted. IMPRESSION: Vascular congestion with bilateral airspace opacity likely related to underlying edema. Multifocal pneumonia cannot be totally excluded on the basis of this exam. Continued follow-up is recommended Electronically Signed   By: Inez Catalina M.D.   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 Pulmonary Arterial Hypertension CKD Stage IIII Hypertension WHO class I& II, Functional Class III, hypervolemic -BNP found to be elevated at 489  -Creatinine elevated at 3.21 with a baseline - Agree with IV 40 mg Lasix twice daily, especially since getting until close to baseline weight  - would check standing weights daily -Would consider nephrology consult to assist>>follows in the OP setting -Echocardiogram pending -Spironolactone added by primary team- would follow creatinine closely - -Would restart PTA amlodipine 10, carvedilol 12.5, clonidine 0.1, doxazosin 39m, hydralazine 100 TID -Maintained on Sildenafil 271mTID; would return  Paroxysmal atrial fibrillation with AC indication With MDS unamenable to [facility]Maintained on amiodarone 20049mD, Toprol 51m89m  -TSH found to be  elevated this admission at >10 - would send free T4 (ordered) -No AC due to chronic anemia, fecal occult positive stool and  epistaxis   . Anemia with underlying MDS: -Receives transfusions every 2-3 weeks  -Follows with Dr. Alen Blew, hematology  -Hb, 7.2>>>receiving 1U PRBC  HLD: -Restart statin   For questions or updates, please contact [place] Please consult [email or web address] for contact info under Cardiology/STEMI.   Lyndel Safe NP-C [facility] Pager: [phone] 06/07/2020 1:43 PM  Personally seen and examined. Agree with APP above with the edits made above and with following comments: 64 yo F with PAH,  Rheumatic AS, MS, and MR, with MDS and CKD.  Restarting PAH and systemic HTN medications, following Free T4.  Would be cautious with Aldactone and diuresis given little renal reserve.  Werner Lean, MD

## 2020-06-07 NOTE — H&P (Signed)
History and Physical    Patricia Mata WUX:[id number] DOB: 1955/07/29 DOA: 06/07/2020  PCP: [name], FNP Consultants:  [facility] - cardiology; [facility] - oncology; [place] - pulmonology Patient coming from:  Home - lives with husband; NOK: Patricia Mata, [phone]; Patricia Mata, [phone]  Chief Complaint: SOB  HPI: Patricia Mata is a 64 y.o. female with medical history significant of HTN; DM; remote colon cancer/current MDS with transfusion-dependent anemia; chronic combined CHF; obesity (BMI 37); severe AS; and stage IV CKD presenting with SOB.   She reports waking up about 0430 - she felt funny and thought it might be anxiety.  She called her son about 41 and felt too out of breath to get out of bed.  She was told previously she had PNA and was discharged and then returned.  They told them if she was gaining weight to notify the doctor, has been stable but then "got a little lax" and they think recently her weight has increased.  She has gained maybe 20 pounds in the last month or so.  Her son has noticed in the last 2 weeks that maybe she was having some increased WOB.    +LE edema this week.  No CP.  Last transfusion was 11/4 and she also receives epo injections.  She is also having about QOD epistaxis.  She does report prior h/o DNR status but now says that she wants to stay alive as long as possible and is willing to have HD if necessary - but wants to put that off as long as possible.    ED Course:  SOB x 1-2 days, worse with activity, +orthopnea.  No cough/fever.  Little LE edema.  Afebrile, BP ok.  Pulse ox in mid-80s with EMS.  +crackles.  Hgb 7 prior and transfused, now back to 7.  Creatinine 3.2, slightly higher than baseline (2.7-3).  CXR with CHF.  Likely multifactorial - CHF with anemia, CKD, AS.  Given Lasix, blood.   Review of Systems: As per HPI; otherwise review of systems reviewed and negative.   Ambulatory Status:  Ambulates  without assistance  COVID Vaccine Status:  Complete plus booster  Past Medical History:  Diagnosis Date  . Acute renal failure (ARF) ([place]) 06/08/2018  . Cancer [facility]) 2008   Colon   . Chronic diastolic (congestive) heart failure ([place])   . Diabetes mellitus without complication ([place])   . Gout 09/08/2018  . Hypertension   . Macrocytic anemia   . MDS (myelodysplastic syndrome) ([place])   . Stage 4 chronic kidney disease [facility])     Past Surgical History:  Procedure Laterality Date  . COLON SURGERY    . DEBRIDMENT OF DECUBITUS ULCER N/A 06/12/2018   Procedure: DEBRIDMENT OF DECUBITUS ULCER;  Surgeon: Wallace Going, DO;  Location: [facility];  Service: Plastics;  Laterality: N/A;  . IR FLUORO GUIDE PORT INSERTION RIGHT  04/02/2017  . IR REMOVAL TUN ACCESS W/ PORT W/O FL MOD SED  03/16/2019  . IR US GUIDE VASC ACCESS RIGHT  04/02/2017  . RIGHT HEART CATH N/A 04/23/2019   Procedure: RIGHT HEART CATH;  Surgeon: Jolaine Artist, MD;  Location: [place] [facility];  Service: Cardiovascular;  Laterality: N/A;  . RIGHT/LEFT HEART CATH AND CORONARY ANGIOGRAPHY N/A 05/21/2018   Procedure: RIGHT/LEFT HEART CATH AND CORONARY ANGIOGRAPHY;  Surgeon: Nelva Bush, MD;  Location: [place] [facility];  Service: Cardiovascular;  Laterality: N/A;    Social History   Socioeconomic History  .  Marital status: Married    Spouse name: Not on file  . Number of children: Not on file  . Years of education: Not on file  . Highest education level: Not on file  Occupational History  . Occupation: retired  Tobacco Use  . Smoking status: Former Smoker    Packs/day: 0.25    Years: 20.00    Pack years: 5.00    Types: Cigarettes    Quit date: 01/31/2001    Years since quitting: 19.3  . Smokeless tobacco: Former Network engineer  . Vaping Use: Never used  Substance and Sexual Activity  . Alcohol use: Yes    Alcohol/week: 2.0 standard drinks    Types: 2 Shots of liquor per week  . Drug use: Never  .  Sexual activity: Not on file  Other Topics Concern  . Not on file  Social History Narrative  . Not on file   Social Determinants of Health   Financial Resource Strain: Medium Risk  . Difficulty of Paying Living Expenses: Somewhat hard  Food Insecurity:   . Worried About Charity fundraiser in the Last Year: Not on file  . Ran Out of Food in the Last Year: Not on file  Transportation Needs:   . Lack of Transportation (Medical): Not on file  . Lack of Transportation (Non-Medical): Not on file  Physical Activity:   . Days of Exercise per Week: Not on file  . Minutes of Exercise per Session: Not on file  Stress:   . Feeling of Stress : Not on file  Social Connections:   . Frequency of Communication with Friends and Family: Not on file  . Frequency of Social Gatherings with Friends and Family: Not on file  . Attends Religious Services: Not on file  . Active Member of Clubs or Organizations: Not on file  . Attends Archivist Meetings: Not on file  . Marital Status: Not on file  Intimate Partner Violence:   . Fear of Current or Ex-Partner: Not on file  . Emotionally Abused: Not on file  . Physically Abused: Not on file  . Sexually Abused: Not on file    Allergies  Allergen Reactions  . Ancef [Cefazolin] Itching    Severe itching- after procedure, ancef was the antibiotic.-04/02/17 Tolerates penicillins  . Lactose Intolerance (Gi) Diarrhea  . Sulfa Antibiotics Rash and Other (See Comments)    Blisters, also    Family History  Problem Relation Age of Onset  . Hypertension Mother   . Diabetes Mother   . Cervical cancer Mother   . Heart attack Father   . Hypertension Sister   . Hypertension Brother   . Hypertension Sister   . Hypertension Sister   . Prostate cancer Brother   . HIV/AIDS Brother     Prior to Admission medications   Medication Sig Start Date End Date Taking? Authorizing Provider  albuterol (PROAIR HFA) 108 (90 Base) MCG/ACT inhaler Inhale 2  puffs into the lungs every 6 (six) hours as needed for wheezing or shortness of breath. 01/28/20   Collene Gobble, MD  allopurinol (ZYLOPRIM) 100 MG tablet Take 300 mg by mouth daily.  05/07/19   [provider]  allopurinol (ZYLOPRIM) 300 MG tablet Take 300 mg by mouth daily. 06/04/20   [provider]  amiodarone (PACERONE) 200 MG tablet Take 1 tablet (200 mg total) by mouth daily. 04/10/20   Bensimhon, Shaune Pascal, MD  amLODipine (NORVASC) 10 MG tablet Take 1  tablet (10 mg total) by mouth daily. 03/22/20   Larey Dresser, MD  atorvastatin (LIPITOR) 40 MG tablet Take 1 tablet (40 mg total) by mouth daily. 12/16/19   [name], FNP  blood glucose meter kit and supplies Dispense based on patient and insurance preference. Use up to four times daily as directed. (FOR ICD-10 E10.9, E11.9). Patient taking differently: 1 each by Other route See admin instructions. Dispense based on patient and insurance preference. Use up to four times daily as directed. (FOR ICD-10 E10.9, E11.9). 11/04/18   [name], FNP  Blood Glucose Monitoring Suppl (ONETOUCH ULTRALINK) w/Device KIT Check blood sugars twice daily E11.9 Patient taking differently: 1 each by Other route See admin instructions. Check blood sugars twice daily E11.9 11/30/19   [name], FNP  carvedilol (COREG) 12.5 MG tablet Take 1 tablet (12.5 mg total) by mouth 2 (two) times daily. 05/22/20 08/20/20  Skeet Latch, MD  cloNIDine (CATAPRES) 0.1 MG tablet Take 1 tablet (0.1 mg total) by mouth 2 (two) times daily. 11/30/19   Bensimhon, Shaune Pascal, MD  colchicine 0.6 MG tablet Take 0.6 mg by mouth as directed. Take 1/2 tablet twice a day as needed for gout    [provider]  dextromethorphan-guaiFENesin (MUCINEX DM) 30-600 MG 12hr tablet Take 1 tablet by mouth 2 (two) times daily. 01/28/20   Collene Gobble, MD  doxazosin (CARDURA) 2 MG tablet Take 2 mg by mouth at bedtime. 03/21/20   [provider]  doxazosin (CARDURA)  8 MG tablet Take 1 tablet (8 mg total) by mouth daily. 04/27/20   Skeet Latch, MD  fluticasone [facility]) 50 MCG/ACT nasal spray Place 1 spray into both nostrils daily. 01/28/20   Collene Gobble, MD  furosemide (LASIX) 40 MG tablet Take 1 tablet by mouth once daily Patient taking differently: Take 40 mg by mouth daily.  02/23/20   [name], FNP  glucose blood test strip check blood sugars twice daily Dx code E11.22 Patient taking differently: 1 each by Other route See admin instructions. check blood sugars twice daily Dx code E11.22 03/02/20   [name], FNP  hydrALAZINE (APRESOLINE) 100 MG tablet Take 1 tablet (100 mg total) by mouth 3 (three) times daily. 02/22/20   Skeet Latch, MD  Insulin Glargine [facility]) 100 UNIT/ML Inject 0.14 mLs (14 Units total) into the skin daily. Patient taking differently: Inject 16 Units into the skin daily.  02/03/20   [name], FNP  Insulin Pen Needle 29G X 5MM MISC Use as directed Patient taking differently: 1 each by Other route See admin instructions. Use as directed 06/16/17   Bonnielee Haff, MD  Lancets (ONETOUCH DELICA PLUS [id number]) [place] check blood sugars twice daily Dx code: E11.22 Patient taking differently: 1 each by Other route See admin instructions. check blood sugars twice daily Dx code: E11.22 03/02/20   [name], FNP  latanoprost (XALATAN) 0.005 % ophthalmic solution Place 1 drop into both eyes at bedtime.     [provider]  levothyroxine (SYNTHROID) 125 MCG tablet Take 1 tablet (125 mcg total) by mouth daily. 05/05/20 05/05/21  [name], FNP  linagliptin (TRADJENTA) 5 MG TABS tablet Take 1 tablet (5 mg total) by mouth daily. 02/03/20   [name], FNP  loratadine (CLARITIN) 10 MG tablet Take 1 tablet (10 mg total) by mouth daily. 05/04/20   Collene Gobble, MD  Multiple Vitamins-Minerals (CENTRUM SILVER 50+WOMEN) TABS Take 1 tablet by mouth daily.    [provider]  oxymetazoline  (AFRIN) 0.05 % nasal spray Place 1 spray into both nostrils 2 (two) times daily as needed for congestion.    [provider]  pantoprazole (PROTONIX) 40 MG tablet Take 1 tablet (40 mg total) by mouth daily. 05/04/20   Collene Gobble, MD  sildenafil (REVATIO) 20 MG tablet TAKE 1 TABLET BY MOUTH THREE TIMES DAILY Patient taking differently: Take 20 mg by mouth 3 (three) times daily.  05/29/20   Bensimhon, Shaune Pascal, MD  valACYclovir (VALTREX) 500 MG tablet Take 1 tablet by mouth two times a day Patient taking differently: Take 500 mg by mouth 2 (two) times daily.  05/11/20   [name], FNP    Physical Exam: Vitals:   06/07/20 1517 06/07/20 1532 06/07/20 1547 06/07/20 1602  BP: (!) 169/48 (!) 171/60 (!) 180/54 (!) 186/60  Pulse: 66 66 66 67  Resp:      Temp:      TempSrc:      SpO2: 100% 100% 99% 100%  Weight:      Height:         . General:  Appears calm and comfortable and is NAD . Eyes:  PERRL, EOMI, normal lids, iris . ENT:  grossly normal hearing, lips & tongue, mmm; some absent dentition . Neck:  no LAD, masses or thyromegaly . Cardiovascular:  RRR, no r/g, 1-8/ systolic murmur best heard in aortic region. No LE edema.  Marland Kitchen Respiratory:   Scant bibasilar crackles.  Normal respiratory effort on [place] O2. . Abdomen:  soft, NT, ND, NABS . Skin:  no rash or induration seen on limited exam . Musculoskeletal:  grossly normal tone BUE/BLE, good ROM, no bony abnormality . Psychiatric:  grossly normal mood and affect, speech fluent and appropriate, AOx3 . Neurologic:  CN 2-12 grossly intact, moves all extremities in coordinated fashion    Radiological Exams on Admission: Independently reviewed - see discussion in A/P where applicable  DG Chest Port 1 View  Result Date: 06/07/2020 CLINICAL DATA:  Shortness of breath EXAM: PORTABLE CHEST 1 VIEW COMPARISON:  12/07/2019 FINDINGS: Cardiac shadow is enlarged. Vascular congestion is identified increasing parenchymal opacity  likely representing edema although acute multifocal pneumonia is not excluded. Small effusions are noted bilaterally. No bony abnormality is seen. Aortic atherosclerotic changes are noted. IMPRESSION: Vascular congestion with bilateral airspace opacity likely related to underlying edema. Multifocal pneumonia cannot be totally excluded on the basis of this exam. Continued follow-up is recommended Electronically Signed   By: Inez Catalina M.D.   On: 06/07/2020 08:33    EKG: Independently reviewed.  NSR with rate 71; no evidence of acute ischemia   Labs on Admission: I have personally reviewed the available labs and imaging studies at the time of the admission.  Pertinent labs:   CO2 16 Glucose 224 BUN 80/Creatinine 3.21/GFR 15 - slightly higher than usual baseline BNP 489.2, relatively stable WBC 7.1 Hgb 7.4; also 7.4 on 11/4 COVID/flu negative Heme positive TSH 10.361   Assessment/Plan Principal Problem:   Acute hypoxemic respiratory failure (HCC) Active Problems:   Anemia due to chronic kidney disease   MDS (myelodysplastic syndrome) (HCC)   Essential hypertension   Hyperlipidemia   Severe aortic stenosis   Carotid artery disease (HCC)   Atrial fibrillation, transient (HCC)   Type 2 diabetes mellitus with hemoglobin A1c goal of less than 7.0% (HCC)   Hypothyroidism   Pulmonary hypertension (HCC)   Chronic kidney disease, stage 5 (HCC)   Acute on chronic diastolic CHF (  congestive heart failure) (HCC)   Acute respiratory failure with hypoxia -Appears to be multifactorial -She has known diastolic CHF and reports recent 20 pound weight gain - she does appear to have acute exacerbation -Also with chronic transfusion-dependent anemia, current Hgb 7.4 -Also with severe AS and moderate MS, reportedly non-operable -Also with advanced CKD -Likely all issues above are contributing -Also with h/o PAD and dur for outpatient carotid US today; will perform as inpatient given  multifactorial issues  Acute on chronic diastolic CHF -Patient with known h/o chronic diastolic CHF presenting with worsening SOB and hypoxia -CXR consistent with mild pulmonary edema -Generally stable BNP with known baseline -Acute decompensated CHF seems probable as diagnosis with multiple comorbid contributing factors -Will admit, as per the Emergency HF Mortality Risk Grade.  The patient has: pulmonary edema requiring new O2 therapy -Most recent echo was on 09/21/19 and showed preserved EF with grade 2 diastolic dysfunction and severe AS -ACE/ARB, beta blocker, and spironolactone are recommended as per guideline-directed medical therapy to reduce morbidity/mortality; she is already on BB and is unable to take ACE/ARB.  Will add spironolactone. -CHF order set utilized -Was given Lasix 40 mg x 1 in ER and will repeat with 40 mg IV BID -Continue [place] O2 for now -Consider addition of Bidil or Digoxin if still symptomatic despite guideline-directed therapies, as above -Treatment with SGLT-2-inhibitors reduces CHF-associated hospitalizations and should be considered -Ivabradine and IV iron have also been shown to reduce hospitalizations -Cardiology has been consulted  Anemia with underlying MDS -Most recently seen by Dr. Alen Blew on 11/4 -Previously had MDS treated with 5-azacytidine -On Reblozyl q3 weeks for anemia, with recurrent transfusions as needed -On palliative treatments only -EDP has ordered 2 units PRBC today -Heme + stool today without report of obvious GI bleeding, will follow for now but may need GI consultation if she continues to downtrend post-tranfusion; hold Lovenox and use SCDs  Advanced CKD -Has renal vascular HTN with RAS -No ACE-ARB due to advanced CKD -Previously required CVVHD during hospitalization in 2019 but has not required HD to date -Likely needs outpatient f/u for consideration of AV fistula placement due to progressive disease  Severe AS, moderate MS -Not a  candidate for intervention due to her anemia requiring recurrent transfusions -Cardiology consulted  Afib -Rate controlled with Amiodarone -Not a candidate for [facility] based on transfusion-dependent anemia  Pulmonary HTN -Improved since starting Sildenafil  HTN -Dr. Oval Linsey added Coreg on 11/8 -Continue home meds - Norvasc, Coreg, Catapres, Cardura, Hydralazine -Will add prn IV hydralazine  HLD -Continue Lipitor -Lipids were checked in 04/2020 (TC 174, HDL 84, TG 58, LDL 79)  DM -Last A1c was 6.0 -Continue Glargine -Will cover with sensitive-scale SSI for now  Obesity -Body mass index is 38.86 kg/m..  -Weight loss should be encouraged -Outpatient PCP/bariatric medicine/bariatric surgery f/u encouraged  H/o colon cancer -Remote -s/p hemicolectomy and FOLFOX  Hypothyroidism -Significantly elevated TSH -Will increase Synthroid from 125 mcg to 150 mcg -Needs repeat thyroid testing in 4-6 weeks  Goals of care -Patient has complicated medical conditions and overall guarded prognosis -She remains full code -She appears to benefit from palliative care consultation and is in agreement with this plan   Note: This patient has been tested and is negative for the novel coronavirus COVID-19. She has been fully vaccinated against COVID-19.   DVT prophylaxis: Lovenox  Code Status:  Full - confirmed with patient/son Family Communication: Son was present throughout evalaution Disposition Plan:  The patient is from:  home  Anticipated d/c is to: home, possibly with [facility] services  Anticipated d/c date will depend on clinical response to treatment, likely 2-4 days  Patient is currently: acutely ill Consults called: Cardiology; Palliative care; [facility] team; PT Admission status: Admit - It is my clinical opinion that admission to INPATIENT is reasonable and necessary because this patient will require at least 2 midnights in the hospital to treat this condition based on the medical complexity  of the problems presented.  Given the aforementioned information, the predictability of an adverse outcome is felt to be significant.    Karmen Bongo MD [facility]   How to contact the [facility] Attending or Consulting provider [place] or covering provider during after hours [place], for this patient?  1. Check the care team in [facility] and look for a) attending/consulting TRH provider listed and b) the [facility] team listed 2. Log into [email or web address] and use [place]'s universal password to access. If you do not have the password, please contact the hospital operator. 3. Locate the [facility] provider you are looking for under [facility] and page to a number that you can be directly reached. 4. If you still have difficulty reaching the provider, please page the [facility] (Director on Call) for the Hospitalists listed on amion for assistance.   06/07/2020, 5:14 PM

## 2020-06-07 NOTE — Plan of Care (Signed)
  Problem: Education: Goal: Ability to demonstrate management of disease process will improve 06/07/2020 1214 by Arlina Robes, RN Outcome: Progressing 06/07/2020 1213 by Arlina Robes, RN Outcome: Progressing 06/07/2020 1209 by Arlina Robes, RN Outcome: Progressing 06/07/2020 1209 by Arlina Robes, RN Outcome: Progressing Goal: Ability to verbalize understanding of medication therapies will improve 06/07/2020 1214 by Arlina Robes, RN Outcome: Progressing 06/07/2020 1213 by Arlina Robes, RN Outcome: Progressing 06/07/2020 1209 by Arlina Robes, RN Outcome: Progressing 06/07/2020 1209 by Arlina Robes, RN Outcome: Progressing Goal: Individualized Educational Video(s) 06/07/2020 1214 by Arlina Robes, RN Outcome: Progressing 06/07/2020 1213 by Arlina Robes, RN Outcome: Progressing 06/07/2020 1209 by Arlina Robes, RN Outcome: Progressing 06/07/2020 1209 by Arlina Robes, RN Outcome: Progressing   Problem: Activity: Goal: Capacity to carry out activities will improve 06/07/2020 1214 by Arlina Robes, RN Outcome: Progressing 06/07/2020 1213 by Arlina Robes, RN Outcome: Progressing 06/07/2020 1209 by Arlina Robes, RN Outcome: Progressing 06/07/2020 1209 by Arlina Robes, RN Outcome: Progressing   Problem: Cardiac: Goal: Ability to achieve and maintain adequate cardiopulmonary perfusion will improve 06/07/2020 1214 by Arlina Robes, RN Outcome: Progressing 06/07/2020 1213 by Arlina Robes, RN Outcome: Progressing 06/07/2020 1209 by Arlina Robes, RN Outcome: Progressing 06/07/2020 1209 by Arlina Robes, RN Outcome: Progressing   Problem: Education: Goal: Ability to identify signs and symptoms of gastrointestinal bleeding will improve Outcome: Progressing   Problem: Bowel/Gastric: Goal: Will show no signs and symptoms of gastrointestinal bleeding Outcome:  Progressing   Problem: Fluid Volume: Goal: Will show no signs and symptoms of excessive bleeding Outcome: Progressing   Problem: Clinical Measurements: Goal: Complications related to the disease process, condition or treatment will be avoided or minimized Outcome: Progressing

## 2020-06-07 NOTE — Progress Notes (Signed)
Initial Nutrition Assessment  DOCUMENTATION CODES:   Obesity unspecified  INTERVENTION:   -Liberalize diet from renal/ carb modified diet with 1.5 L fluid restriction to heart heathy/ carb modified diet with 1.5 L fluid restriction per discussion with Dr. Lorin Mercy -MVI with minerals daily -30 ml Prosource Plus BID, each supplement provides 100 kcals and 15 grams protein  NUTRITION DIAGNOSIS:   Increased nutrient needs related to chronic illness (CHF) as evidenced by estimated needs.  GOAL:   Patient will meet greater than or equal to 90% of their needs  MONITOR:   PO intake, Supplement acceptance, Labs, Weight trends, Skin, I & O's  REASON FOR ASSESSMENT:   Consult Assessment of nutrition requirement/status  ASSESSMENT:   Patient with hx chf, chronic anemia, AS, c/o increased sob in past day. Symptoms acute onset, mod-severe, constant, worse this AM, worse when supine/flat or w activity.  Pt admitted with CHF exacerbation.   Reviewed I/O's: -325 ml x 24 hours  Attempted to speak with pt via call to hospital room phone, however, unable to reach.   No meal completions available to assess at this time. Noted wt gain, likely related to edema.   Case discussed with Dr. Lorin Mercy; pt is currently on a renal/carb modified diet with 1.5 L fluid restriction, which is very restrictive and designed for pts on HD. Dr. Lorin Mercy is amenable to diet liberalization as long as fluid and sodium restrictions are maintained.   Medications reviewed and include colace and lasix.   Lab Results  Component Value Date   HGBA1C 6.0 (H) 05/11/2020   PTA DM medications are 14 units insulin glargine daily and 5 mg linagliptin daily.   Labs reviewed: CBGS: 168 (inpatient orders for glycemic control are 0-9 units insulin aspart TID with meals).   Diet Order:   Diet Order            Diet heart healthy/carb modified Room service appropriate? Yes; Fluid consistency: Thin; Fluid restriction: 1500 mL Fluid   Diet effective now                 EDUCATION NEEDS:   No education needs have been identified at this time  Skin:  Skin Assessment: Reviewed RN Assessment  Last BM:  06/06/20  Height:   Ht Readings from Last 1 Encounters:  06/07/20 5\' 4"  (1.626 m)    Weight:   Wt Readings from Last 1 Encounters:  06/07/20 102.7 kg    Ideal Body Weight:  54.5 kg  BMI:  Body mass index is 38.86 kg/m.  Estimated Nutritional Needs:   Kcal:  1700-1900  Protein:  110-125 grams  Fluid:  1.5 L    Loistine Chance, RD, LDN, [place] Registered Dietitian II Certified Diabetes Care and Education Specialist Please refer to [facility] for RD and/or RD on-call/weekend/after hours pager

## 2020-06-07 NOTE — Progress Notes (Signed)
  Echocardiogram 2D Echocardiogram has been performed.  Jennette Dubin 06/07/2020, 3:23 PM

## 2020-06-07 NOTE — ED Triage Notes (Signed)
BIB [facility] from home after pt called to report increase in shortness of breath since 0430 AM. Per EMS, pt 02 sat was 80's upon arrival. 2 L [place] was applied, pt  02 sat went up top 99. Pt is not on 02 at baseline. Denies c/p. PT has hx of CHF.  HR 70 02 93% 2 L RR 17 Temp 97.4

## 2020-06-07 NOTE — Evaluation (Signed)
Occupational Therapy Evaluation and Discharge Patient Details Name: Patricia Mata MRN: [id number] DOB: 11/01/1955 Today's Date: 06/07/2020    History of Present Illness Patient with hx chf, chronic anemia, AS, c/o increased sob in past day. Symptoms acute onset, mod-severe, constant, worse this AM, worse when supine/flat or w activity. Denies chest pain or discomfort. Occasional non prod cough. No sore throat or runny nose. No fever or body aches. No known ill contacts. Denies leg pain or swelling. States compliant w home meds including lasix. Denies decreased urine output. EMS notes initial pulse ox in 80's, improved to mid 90's on 2 liters [place].   Clinical Impression   Pt is functioning modified independently in ADL and mobility within her room without AD. She demonstrates decreased activity tolerance. Educated in energy conservation strategies and provided written handout to reinforce. No further OT needs.     Follow Up Recommendations  No OT follow up    Equipment Recommendations  None recommended by OT    Recommendations for Other Services       Precautions / Restrictions Precautions Precautions: None Restrictions Weight Bearing Restrictions: No      Mobility Bed Mobility Overal bed mobility: Modified Independent                  Transfers Overall transfer level: Modified independent Equipment used: None                  Balance Overall balance assessment: Modified Independent                                         ADL either performed or assessed with clinical judgement   ADL Overall ADL's : Modified independent                                       General ADL Comments: Educated in energy conservation strategies and provided written handout.     Vision Baseline Vision/History: Wears glasses Patient Visual Report: No change from baseline       Perception     Praxis      Pertinent Vitals/Pain Pain  Assessment: No/denies pain     Hand Dominance Left   Extremity/Trunk Assessment Upper Extremity Assessment Upper Extremity Assessment: Overall WFL for tasks assessed   Lower Extremity Assessment Lower Extremity Assessment: Defer to PT evaluation   Cervical / Trunk Assessment Cervical / Trunk Assessment: Normal   Communication Communication Communication: No difficulties   Cognition Arousal/Alertness: Awake/alert Behavior During Therapy: WFL for tasks assessed/performed Overall Cognitive Status: Within Functional Limits for tasks assessed                                     General Comments       Exercises     Shoulder Instructions      Home Living Family/patient expects to be discharged to:: Private residence Living Arrangements: Spouse/significant other Available Help at Discharge: Family;Available 24 hours/day Type of Home: House Home Access: Stairs to enter [employer] of Steps: 4 Entrance Stairs-Rails: Left;Right;Can reach both Home Layout: One level     Bathroom Shower/Tub: Occupational psychologist: Handicapped height Bathroom Accessibility: Yes   Home Equipment: Grab bars - tub/shower  Additional Comments: drives, not using AD prior to admission      Prior Functioning/Environment Level of Independence: Independent        Comments: prior to hospital        OT Problem List: Decreased activity tolerance      OT Treatment/Interventions:      OT Goals(Current goals can be found in the care plan section) Acute Rehab OT Goals Patient Stated Goal: to feel better, return home  OT Frequency:     Barriers to D/C:            Co-evaluation              AM-PAC OT "6 Clicks" Daily Activity     Outcome Measure Help from another person eating meals?: None Help from another person taking care of personal grooming?: None Help from another person toileting, which includes using toliet, bedpan, or urinal?:  None Help from another person bathing (including washing, rinsing, drying)?: None Help from another person to put on and taking off regular upper body clothing?: None Help from another person to put on and taking off regular lower body clothing?: None 6 Click Score: 24   End of Session    Activity Tolerance: Patient tolerated treatment well Patient left: in bed;with call bell/phone within reach;with nursing/sitter in room  OT Visit Diagnosis: Other (comment) (decreased activity tolerance)                Time: 1450-1504 OT Time Calculation (min): 14 min Charges:  OT General Charges $OT Visit: 1 Visit OT Evaluation $OT Eval Low Complexity: 1 Low  Nestor Lewandowsky, OTR/L Acute Rehabilitation Services Pager: [phone] Office: [phone]  Malka So 06/07/2020, 3:07 PM

## 2020-06-08 ENCOUNTER — Inpatient Hospital Stay (HOSPITAL_COMMUNITY): Payer: Medicare Other

## 2020-06-08 DIAGNOSIS — N184 Chronic kidney disease, stage 4 (severe): Secondary | ICD-10-CM | POA: Diagnosis not present

## 2020-06-08 DIAGNOSIS — I10 Essential (primary) hypertension: Secondary | ICD-10-CM

## 2020-06-08 DIAGNOSIS — I272 Pulmonary hypertension, unspecified: Secondary | ICD-10-CM | POA: Diagnosis not present

## 2020-06-08 DIAGNOSIS — Z515 Encounter for palliative care: Secondary | ICD-10-CM

## 2020-06-08 DIAGNOSIS — R0902 Hypoxemia: Secondary | ICD-10-CM

## 2020-06-08 DIAGNOSIS — N179 Acute kidney failure, unspecified: Secondary | ICD-10-CM | POA: Diagnosis present

## 2020-06-08 DIAGNOSIS — I050 Rheumatic mitral stenosis: Secondary | ICD-10-CM | POA: Diagnosis not present

## 2020-06-08 DIAGNOSIS — I350 Nonrheumatic aortic (valve) stenosis: Secondary | ICD-10-CM | POA: Diagnosis not present

## 2020-06-08 DIAGNOSIS — I5033 Acute on chronic diastolic (congestive) heart failure: Secondary | ICD-10-CM

## 2020-06-08 LAB — BPAM RBC
Blood Product Expiration Date: 202111262359
Blood Product Expiration Date: 202112162359
ISSUE DATE / TIME: 202111241421
ISSUE DATE / TIME: 202111241711
Unit Type and Rh: 600
Unit Type and Rh: 600

## 2020-06-08 LAB — BASIC METABOLIC PANEL
Anion gap: 14 (ref 5–15)
BUN: 80 mg/dL — ABNORMAL HIGH (ref 8–23)
CO2: 20 mmol/L — ABNORMAL LOW (ref 22–32)
Calcium: 9.5 mg/dL (ref 8.9–10.3)
Chloride: 103 mmol/L (ref 98–111)
Creatinine, Ser: 3.05 mg/dL — ABNORMAL HIGH (ref 0.44–1.00)
GFR, Estimated: 16 mL/min — ABNORMAL LOW (ref >60)
Glucose, Bld: 154 mg/dL — ABNORMAL HIGH (ref 70–99)
Potassium: 4.1 mmol/L (ref 3.5–5.1)
Sodium: 137 mmol/L (ref 135–145)

## 2020-06-08 LAB — TYPE AND SCREEN
ABO/RH(D): A NEG
Antibody Screen: POSITIVE
Unit division: 0
Unit division: 0

## 2020-06-08 LAB — CBC
HCT: 26.9 % — ABNORMAL LOW (ref 36.0–46.0)
Hemoglobin: 9.1 g/dL — ABNORMAL LOW (ref 12.0–15.0)
MCH: 30.2 pg (ref 26.0–34.0)
MCHC: 33.8 g/dL (ref 30.0–36.0)
MCV: 89.4 fL (ref 80.0–100.0)
Platelets: 172 10*3/uL (ref 150–400)
RBC: 3.01 MIL/uL — ABNORMAL LOW (ref 3.87–5.11)
RDW: 20.1 % — ABNORMAL HIGH (ref 11.5–15.5)
WBC: 8.1 10*3/uL (ref 4.0–10.5)
nRBC: 2 % — ABNORMAL HIGH (ref 0.0–0.2)

## 2020-06-08 LAB — GLUCOSE, CAPILLARY
Glucose-Capillary: 142 mg/dL — ABNORMAL HIGH (ref 70–99)
Glucose-Capillary: 134 mg/dL — ABNORMAL HIGH (ref 70–99)
Glucose-Capillary: 162 mg/dL — ABNORMAL HIGH (ref 70–99)

## 2020-06-08 LAB — T4, FREE: Free T4: 1.11 ng/dL (ref 0.61–1.12)

## 2020-06-08 MED ORDER — BISACODYL 5 MG PO TBEC: 5.0000 mg | DELAYED_RELEASE_TABLET | Freq: Every day | ORAL | Status: DC | PRN

## 2020-06-08 NOTE — Assessment & Plan Note (Addendum)
Dr. Oval Linsey added Spry on 11/8 -Continue home meds - Norvasc, Coreg, Catapres, Cardura, Hydralazine - nephrology recommendations reviewed as well; resistant HTN and volume challenges may benefit from RAS intervention - prn IV hydralazine

## 2020-06-08 NOTE — Progress Notes (Signed)
Carotid artery duplex completed. Refer to "CV Proc" under chart review to view preliminary results.  06/08/2020 11:00 AM Kelby Aline., MHA, RVT, RDCS, RDMS

## 2020-06-08 NOTE — Hospital Course (Addendum)
Patricia Mata is a 64 y.o. female with medical history significant of HTN; DM; remote colon cancer/current MDS with transfusion-dependent anemia; chronic combined CHF; obesity (BMI 37); severe AS; and stage IV CKD presenting with SOB.    She also had reported weight gain at home over the past month, approximately 20 pounds was suspected.  She has also had increased lower extremity edema and worsening shortness of breath. Her last blood transfusion was on 05/18/2020 and she also receives EPO injections. CXR in the ER showed vascular congestion concerning for CHF/volume overload. She was also hypoxic and placed on 2 L nasal cannula. Hgb was 7.4 and she was given 1 unit PRBC and lasix. Given her presentation with complex underlying issues including severe AS, cardiology was also consulted on admission.  She was initially started on IV Lasix which was transitioned to oral Lasix after increase in creatinine.  She was also briefly started on spironolactone which was also stopped in setting of worsening renal function during hospitalization. She has undergone multiple evaluations with close follow-up outpatient with cardiology.  In the past she has not been considered a good candidate for intervention on her AS or MS due to multiple comorbidities.   In regards to her renal function, she was also followed by nephrology due to slight increase in creatinine and BUN with initial diuresis.  Her renal function was expected to stabilize.  She remained asymptomatic and overall clinically improved, therefore she was recommended for discharging home with close outpatient lab monitoring; if any signs of worsening, she would need to return as there is still potential for her requiring dialysis at some point which she would want. 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  Per nephrology, she may still benefit from outpatient evaluation especially in setting of resistant  hypertension. Unfortunately, she has multiple vascular abnormalities in addition, including carotid artery stenosis which has progressed in the left ICA.  She may also need follow-up with vascular surgery as well regarding this.  She is mainly on Lipitor.  Due to her MDS and chronic anemia, she is not on aspirin or anticoagulation.

## 2020-06-08 NOTE — Assessment & Plan Note (Signed)
-  continue synthroid 

## 2020-06-08 NOTE — Progress Notes (Addendum)
PROGRESS NOTE    Patricia Mata   AJO:[id number]  DOB: 06-20-1956  DOA: 06/07/2020     1  PCP: Patricia Brine, FNP  CC: SOB  Hospital Course: Patricia Mata is a 64 y.o. female with medical history significant of HTN; DM; remote colon cancer/current MDS with transfusion-dependent anemia; chronic combined CHF; obesity (BMI 37); severe AS; and stage IV CKD presenting with SOB.    She also had reported weight gain at home over the past month, approximately 20 pounds was suspected.  She has also had increased lower extremity edema and worsening shortness of breath. Her last blood transfusion was on 05/18/2020 and she also receives EPO injections. CXR in the ER showed vascular congestion concerning for CHF/volume overload. She was also hypoxic and placed on 2 L nasal cannula. Hgb was 7.4 and she was given 1 unit PRBC and lasix. Given her presentation with complex underlying issues including severe AS, cardiology was also consulted on admission.   Interval History:  Seen sitting on edge of bed this morning. She states that her breathing feels better compared to when she first came in. She has been diuresing as well.  Old records reviewed in assessment of this patient  ROS: Constitutional: negative for chills and fevers, Respiratory: positive for dyspnea on exertion, Cardiovascular: negative for chest pain and Gastrointestinal: negative for abdominal pain  Assessment & Plan: * Acute hypoxemic respiratory failure (HCC) - considered multifactorial: chf exac, anemia, severe AS, mod MS - continue O2 and wean as able - continue lasix  Acute on chronic diastolic CHF (congestive heart failure) (HCC) - worsening SOB, LE edema, and hypoxia on admission - echo repeat ordered on admission: EF 60-65%, Gr2DD, mod LVH, mod MS, severe AS - cardiology following as well - continue lasix  MDS (myelodysplastic syndrome) ([place]) Most recently seen by Dr. Alen Mata on 11/4 -Previously had MDS  treated with 5-azacytidine -On Reblozyl q3 weeks for anemia, with recurrent transfusions as needed -On palliative treatments only -EDP has ordered 2 units PRBC on admission  -Heme + stool without report of obvious GI bleeding - given multiple issues as noted, patient not wanting to be extremely aggressive in general; PCM has been consulted as well   Acute renal failure superimposed on stage 4 chronic kidney disease ([place]) - baseline creatinine 2.5-2.9; admitted with creat 3.21 - continue lasix and monitor renal response - BMP and Mg in am  Hypothyroidism - continue synthroid   Type 2 diabetes mellitus with hemoglobin A1c goal of less than 7.0% (HCC) - continue SSI and CBG monitoring   Atrial fibrillation, transient (HCC) -Continue amiodarone -Not an anticoagulation candidate due to underlying MDS and chronic anemia  Carotid artery disease (HCC) - carotid u/s ordered on admission - chronic R occlusion; L ICA was 40-59% in 05/2019, now is 60-79% -Given no aggressive intervention from a cardiac standpoint, not likely to be a good candidate for vascular; will discuss with cardiology - continue lipitor   Aortic stenosis, severe -Again seen on repeat echo this admission -Outpatient notes indicate that patient not a good candidate for intervention on aortic or mitral valve due to underlying comorbidities  Hyperlipidemia - continue lipitor   Essential hypertension Dr. Oval Mata added Coreg on 11/8 -Continue home meds - Norvasc, Coreg, Catapres, Cardura, Hydralazine - prn IV hydralazine  Anemia due to chronic kidney disease -Baseline hemoglobin around 8 g/dL -Received transfusion on admission   Antimicrobials: None  DVT prophylaxis: SCD Code Status: Full Family Communication: None present Disposition Plan:  Status is: Inpatient  Remains inpatient appropriate because:IV treatments appropriate due to intensity of illness or inability to take PO and Inpatient level of care  appropriate due to severity of illness   Dispo: The patient is from: Home              Anticipated d/c is to: Home              Anticipated d/c date is: 2 days              Patient currently is not medically stable to d/c.       Objective: Blood pressure (!) 137/46, pulse (!) 53, temperature 97.8 F (36.6 C), temperature source Oral, resp. rate 18, height 5\' 4"  (1.626 m), weight 100.7 kg, SpO2 93 %.  Examination: General appearance: alert, cooperative and no distress Head: Normocephalic, without obvious abnormality, atraumatic Eyes: EOMI Lungs: Bibasilar crackles Heart: regular rate and rhythm, S1, S2 normal and 3/6 HSM heard throughout precordium Abdomen: normal findings: bowel sounds normal and soft, non-tender Extremities: 2+ lower extremity edema Skin: mobility and turgor normal Neurologic: Grossly normal  Consultants:   Cardiology  Procedures:   None  Data Reviewed: I have personally reviewed following labs and imaging studies Results for orders placed or performed during the hospital encounter of 06/07/20 (from the past 24 hour(s))  Glucose, capillary     Status: Abnormal   Collection Time: 06/07/20  5:01 PM  Result Value Ref Range   Glucose-Capillary 156 (H) 70 - 99 mg/dL  Glucose, capillary     Status: Abnormal   Collection Time: 06/07/20  8:43 PM  Result Value Ref Range   Glucose-Capillary 167 (H) 70 - 99 mg/dL  Basic metabolic panel     Status: Abnormal   Collection Time: 06/08/20  2:33 AM  Result Value Ref Range   Sodium 137 135 - 145 mmol/L   Potassium 4.1 3.5 - 5.1 mmol/L   Chloride 103 98 - 111 mmol/L   CO2 20 (L) 22 - 32 mmol/L   Glucose, Bld 154 (H) 70 - 99 mg/dL   BUN 80 (H) 8 - 23 mg/dL   Creatinine, Ser 3.05 (H) 0.44 - 1.00 mg/dL   Calcium 9.5 8.9 - 10.3 mg/dL   GFR, Estimated 16 (L) >60 mL/min   Anion gap 14 5 - 15  CBC     Status: Abnormal   Collection Time: 06/08/20  2:33 AM  Result Value Ref Range   WBC 8.1 4.0 - 10.5 K/uL   RBC  3.01 (L) 3.87 - 5.11 MIL/uL   Hemoglobin 9.1 (L) 12.0 - 15.0 g/dL   HCT 26.9 (L) 36 - 46 %   MCV 89.4 80.0 - 100.0 fL   MCH 30.2 26.0 - 34.0 pg   MCHC 33.8 30.0 - 36.0 g/dL   RDW 20.1 (H) 11.5 - 15.5 %   Platelets 172 150 - 400 K/uL   nRBC 2.0 (H) 0.0 - 0.2 %  T4, free     Status: None   Collection Time: 06/08/20  2:33 AM  Result Value Ref Range   Free T4 1.11 0.61 - 1.12 ng/dL  Glucose, capillary     Status: Abnormal   Collection Time: 06/08/20  6:20 AM  Result Value Ref Range   Glucose-Capillary 142 (H) 70 - 99 mg/dL   Comment 1 Document in Chart   Glucose, capillary     Status: Abnormal   Collection Time: 06/08/20 12:28 PM  Result Value Ref Range  Glucose-Capillary 134 (H) 70 - 99 mg/dL   *Note: Due to a large number of results and/or encounters for the requested time period, some results have not been displayed. A complete set of results can be found in Results Review.    Recent Results (from the past 240 hour(s))  Respiratory Panel by RT PCR (Flu A&B, Covid) - Nasopharyngeal Swab     Status: None   Collection Time: 06/07/20  8:26 AM   Specimen: Nasopharyngeal Swab; Nasopharyngeal(NP) swabs in vial transport medium  Result Value Ref Range Status   SARS Coronavirus 2 by RT PCR NEGATIVE NEGATIVE Final    Comment: (NOTE) SARS-CoV-2 target nucleic acids are NOT DETECTED.  The SARS-CoV-2 RNA is generally detectable in upper respiratoy specimens during the acute phase of infection. The lowest concentration of SARS-CoV-2 viral copies this assay can detect is 131 copies/mL. A negative result does not preclude SARS-Cov-2 infection and should not be used as the sole basis for treatment or other patient management decisions. A negative result may occur with  improper specimen collection/handling, submission of specimen other than nasopharyngeal swab, presence of viral mutation(s) within the areas targeted by this assay, and inadequate number of viral copies (<131 copies/mL). A  negative result must be combined with clinical observations, patient history, and epidemiological information. The expected result is Negative.  Fact Sheet for Patients:  PinkCheek.be  Fact Sheet for Healthcare Providers:  GravelBags.it  This test is no t yet approved or cleared by the [place] FDA and  has been authorized for detection and/or diagnosis of SARS-CoV-2 by FDA under an Emergency Use Authorization (EUA). This EUA will remain  in effect (meaning this test can be used) for the duration of the COVID-19 declaration under Section 564(b)(1) of the Act, 21 U.S.C. section 360bbb-3(b)(1), unless the authorization is terminated or revoked sooner.     Influenza A by PCR NEGATIVE NEGATIVE Final   Influenza B by PCR NEGATIVE NEGATIVE Final    Comment: (NOTE) The Xpert Xpress SARS-CoV-2/FLU/RSV assay is intended as an aid in  the diagnosis of influenza from Nasopharyngeal swab specimens and  should not be used as a sole basis for treatment. Nasal washings and  aspirates are unacceptable for Xpert Xpress SARS-CoV-2/FLU/RSV  testing.  Fact Sheet for Patients: PinkCheek.be  Fact Sheet for Healthcare Providers: GravelBags.it  This test is not yet approved or cleared by the [place] FDA and  has been authorized for detection and/or diagnosis of SARS-CoV-2 by  FDA under an Emergency Use Authorization (EUA). This EUA will remain  in effect (meaning this test can be used) for the duration of the  Covid-19 declaration under Section 564(b)(1) of the Act, 21  U.S.C. section 360bbb-3(b)(1), unless the authorization is  terminated or revoked. Performed at [facility], [place] [address] [place], [place] [address]      Radiology Studies: DG Chest Port 1 View  Result Date: 06/07/2020 CLINICAL DATA:  Shortness of breath EXAM: PORTABLE CHEST 1 VIEW  COMPARISON:  12/07/2019 FINDINGS: Cardiac shadow is enlarged. Vascular congestion is identified increasing parenchymal opacity likely representing edema although acute multifocal pneumonia is not excluded. Small effusions are noted bilaterally. No bony abnormality is seen. Aortic atherosclerotic changes are noted. IMPRESSION: Vascular congestion with bilateral airspace opacity likely related to underlying edema. Multifocal pneumonia cannot be totally excluded on the basis of this exam. Continued follow-up is recommended Electronically Signed   By: Inez Catalina M.D.   On: 06/07/2020 08:33   ECHOCARDIOGRAM COMPLETE  Result Date: 06/07/2020    ECHOCARDIOGRAM REPORT   Patient Name:   ELINA STRENG Barich Date of Exam: 06/07/2020 Medical Rec #:  [id number]            Height:       64.0 in Accession #:    [id number]           Weight:       226.4 lb Date of Birth:  01-18-1956             BSA:          2.062 m Patient Age:    41 years             BP:           173/37 mmHg Patient Gender: F                    HR:           67 bpm. Exam Location:  Inpatient Procedure: 2D Echo Indications:    CHF  History:        Patient has prior history of Echocardiogram examinations, most                 recent 09/21/2019. Aortic Valve Disease; Risk Factors:Hypertension                 and Diabetes.  Sonographer:    Mikki Santee RDCS (AE) Referring Phys: [place]  1. Left ventricular ejection fraction, by estimation, is 60 to 65%. The left ventricle has normal function. The left ventricle has no regional wall motion abnormalities. There is moderate concentric left ventricular hypertrophy. Left ventricular diastolic parameters are consistent with Grade II diastolic dysfunction (pseudonormalization). Elevated left ventricular end-diastolic pressure.  2. Right ventricular systolic function is normal. The right ventricular size is normal. There is moderately elevated pulmonary artery systolic pressure. The estimated  right ventricular systolic pressure is 43.3 mmHg.  3. Left atrial size was moderately dilated.  4. Right atrial size was severely dilated.  5. 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 The mean mitral valve gradient is 10.0 mmHg. Severe mitral annular calcification.  6. Tricuspid valve regurgitation is mild to moderate.  7. The aortic valve is calcified. Aortic valve regurgitation is trivial. Severe aortic valve stenosis. Aortic valve area, by VTI measures 0.84 cm. Aortic valve Vmax measures 4.07 m/s.  8. The inferior vena cava is dilated in size with <50% respiratory variability, suggesting right atrial pressure of 15 mmHg. Comparison(s): No significant change from prior study. FINDINGS  Left Ventricle: Left ventricular ejection fraction, by estimation, is 60 to 65%. The left ventricle has normal function. The left ventricle has no regional wall motion abnormalities. The left ventricular internal cavity size was normal in size. There is  moderate concentric left ventricular hypertrophy. Left ventricular diastolic parameters are consistent with Grade II diastolic dysfunction (pseudonormalization). Elevated left ventricular end-diastolic pressure. Right Ventricle: The right ventricular size is normal. Right vetricular wall thickness was not well visualized. Right ventricular systolic function is normal. There is moderately elevated pulmonary artery systolic pressure. The tricuspid regurgitant velocity is 3.14 m/s, and with an assumed right atrial pressure of 15 mmHg, the estimated right ventricular systolic pressure is 29.5 mmHg. Left Atrium: Left atrial size was moderately dilated. Right Atrium: Right atrial size was severely dilated. Pericardium: Trivial pericardial effusion is present. Mitral Valve: Mean gradient is 10 mmHg at 64 bpm, but valve area is  2.50 cm2 by PHT. The mitral valve is rheumatic. There is moderate thickening of the mitral valve leaflet(s). There  is moderate calcification of the mitral valve leaflet(s). Severe mitral  annular calcification. Mild mitral valve regurgitation. Moderate mitral valve stenosis. MV peak gradient, 32.5 mmHg. The mean mitral valve gradient is 10.0 mmHg. Tricuspid Valve: The tricuspid valve is normal in structure. Tricuspid valve regurgitation is mild to moderate. No evidence of tricuspid stenosis. Aortic Valve: The aortic valve is calcified. Aortic valve regurgitation is trivial. Aortic regurgitation PHT measures 448 msec. Severe aortic stenosis is present. Aortic valve mean gradient measures 35.0 mmHg. Aortic valve peak gradient measures 66.3 mmHg. Aortic valve area, by VTI measures 0.84 cm. Pulmonic Valve: The pulmonic valve was not well visualized. Pulmonic valve regurgitation is trivial. No evidence of pulmonic stenosis. Aorta: The aortic root and ascending aorta are structurally normal, with no evidence of dilitation. Venous: The inferior vena cava is dilated in size with less than 50% respiratory variability, suggesting right atrial pressure of 15 mmHg. IAS/Shunts: The atrial septum is grossly normal.  LEFT VENTRICLE PLAX 2D LVIDd:         4.40 cm  Diastology LVIDs:         2.80 cm  LV e' medial:    6.22 cm/s LV PW:         1.40 cm  LV E/e' medial:  30.7 LV IVS:        1.30 cm  LV e' lateral:   8.59 cm/s LVOT diam:     2.00 cm  LV E/e' lateral: 22.2 LV SV:         89 LV SV Index:   43 LVOT Area:     3.14 cm  RIGHT VENTRICLE TAPSE (M-mode): 1.9 cm LEFT ATRIUM             Index       RIGHT ATRIUM           Index LA diam:        5.00 cm 2.42 cm/m  RA Area:     29.70 cm LA Vol (A2C):   83.6 ml 40.54 ml/m RA Volume:   100.00 ml 48.49 ml/m LA Vol (A4C):   72.3 ml 35.06 ml/m LA Biplane Vol: 79.0 ml 38.31 ml/m  AORTIC VALVE AV Area (Vmax):    0.71 cm AV Area (Vmean):   0.71 cm AV Area (VTI):     0.84 cm AV Vmax:           407.00 cm/s AV Vmean:          280.000 cm/s AV VTI:            1.050 m AV Peak Grad:      66.3 mmHg AV  Mean Grad:      35.0 mmHg LVOT Vmax:         91.40 cm/s LVOT Vmean:        62.900 cm/s LVOT VTI:          0.282 m LVOT/AV VTI ratio: 0.27 AI PHT:            448 msec  AORTA Ao Root diam: 3.20 cm MITRAL VALVE                TRICUSPID VALVE MV Area (PHT): 2.76 cm     TR Peak grad:   39.4 mmHg MV Peak grad:  32.5 mmHg    TR Vmax:        314.00 cm/s MV  Mean grad:  10.0 mmHg MV Vmax:       2.85 m/s     SHUNTS MV Vmean:      140.0 cm/s   Systemic VTI:  0.28 m MV Decel Time: 275 msec     Systemic Diam: 2.00 cm MV E velocity: 191.00 cm/s MV A velocity: 107.00 cm/s MV E/A ratio:  1.79 Buford Dresser MD Electronically signed by Buford Dresser MD Signature Date/Time: 06/07/2020/7:40:21 PM    Final    VAS US CAROTID  Result Date: 06/08/2020 Carotid Arterial Duplex Study Indications:       History of carotid artery stenosis. Risk Factors:      Hypertension, Diabetes. 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 Limitations        Today's exam was limited due to the high bifurcation of the                    carotid, the body habitus of the patient, heavy calcification                    and the resulting shadowing and the patient's respiratory                    variation. Comparison Study:  06/03/2019 Carotid artery duplex- occluded right ICA, left                    ICA was noted to have 40-59% stenosis, however this was                    diagnosed at the mid ICA for which criteria does not apply.                    Proximal left ICA velocity was documented at 180/35 cm/s,                    consistent with upper range 1-39% stenosis. Performing Technologist: Maudry Mayhew MHA, RDMS, RVT, RDCS  Examination Guidelines: A complete evaluation includes B-mode imaging, spectral Doppler, color Doppler, and power Doppler as needed of all accessible portions of each vessel. Bilateral testing is considered an integral part of a  complete examination. Limited examinations for reoccurring indications may be performed as noted.  Right Carotid Findings: +----------+--------+--------+--------+---------------------+------------------+           PSV cm/sEDV cm/sStenosisPlaque Description   Comments           +----------+--------+--------+--------+---------------------+------------------+ CCA Prox  33                      smooth and                                                                heterogenous                            +----------+--------+--------+--------+---------------------+------------------+ CCA Distal15                      smooth  and                                                                heterogenous                            +----------+--------+--------+--------+---------------------+------------------+ ICA Prox  17                                           thumped waveform   +----------+--------+--------+--------+---------------------+------------------+ ICA Distal12                                           thumped waveform   +----------+--------+--------+--------+---------------------+------------------+ ECA       12                      heterogenous and     Thumped waveform                                     irregular            with diastolic                                                            reversal           +----------+--------+--------+--------+---------------------+------------------+ +----------+--------+-------+---------+-------------------+           PSV cm/sEDV cmsDescribe Arm Pressure (mmHG) +----------+--------+-------+---------+-------------------+ Subclavian272            Turbulent                    +----------+--------+-------+---------+-------------------+ +---------+--------+---+--------+--+---------+ VertebralPSV cm/s127EDV cm/s17Antegrade +---------+--------+---+--------+--+---------+  Left  Carotid Findings: +----------+-------+-------+--------+---------------------------------+--------+           PSV    EDV    StenosisPlaque Description               Comments           cm/s   cm/s                                                     +----------+-------+-------+--------+---------------------------------+--------+ CCA Prox  213    33             smooth and heterogenous                   +----------+-------+-------+--------+---------------------------------+--------+ CCA Distal154    31             heterogenous, irregular and  calcific                                  +----------+-------+-------+--------+---------------------------------+--------+ ICA Prox  243    33             heterogenous and calcific                 +----------+-------+-------+--------+---------------------------------+--------+ ICA Mid   279    37                                                       +----------+-------+-------+--------+---------------------------------+--------+ ICA Distal209    33                                                       +----------+-------+-------+--------+---------------------------------+--------+ ECA       375    29     >50%    heterogenous and calcific                 +----------+-------+-------+--------+---------------------------------+--------+ +----------+--------+--------+----------------+-------------------+           PSV cm/sEDV cm/sDescribe        Arm Pressure (mmHG) +----------+--------+--------+----------------+-------------------+ [id number]             Multiphasic, WNL                    +----------+--------+--------+----------------+-------------------+ +---------+--------+---+--------+--+---------+ VertebralPSV cm/s146EDV cm/s26Antegrade +---------+--------+---+--------+--+---------+   Summary: Right Carotid: Previous carotid artery duplex noted occlusion  of right ICA,                however a dampened waveform was detected by pulsed wave Doppler                in the proximal and distal ICA, suggestive of likely near                occlusion with possible distal occlusion. Left Carotid: Peak systolic ICA velocities suggestive of 60-79% stenosis,               however end diastolic velocities suggest upper range 1-39%               stenosis. Increased perfusion may be secondary to compensatory               flow, considering right ICA disease. Vertebrals:  Bilateral vertebral arteries demonstrate antegrade flow. Subclavians: Right subclavian artery flow was disturbed. Normal flow              hemodynamics were seen in the left subclavian artery. *See table(s) above for measurements and observations.  Electronically signed by Antony Contras MD on 06/08/2020 at 11:43:24 AM.    Final    VAS US CAROTID  Final Result    DG Chest [facility] 1 View  Final Result      Scheduled Meds: . (feeding supplement) PROSource Plus  30 mL Oral BID BM  . allopurinol  300 mg Oral Daily  . amiodarone  200 mg Oral Daily  . amLODipine  10 mg Oral Daily  . atorvastatin  40 mg Oral Daily  . carvedilol  12.5 mg  Oral BID WC  . cloNIDine  0.1 mg Oral BID  . docusate sodium  100 mg Oral BID  . doxazosin  8 mg Oral Daily  . furosemide  40 mg Intravenous BID  . hydrALAZINE  100 mg Oral TID  . insulin aspart  0-9 Units Subcutaneous TID WC  . insulin glargine  18 Units Subcutaneous Daily  . latanoprost  1 drop Both Eyes QHS  . levothyroxine  150 mcg Oral Daily  . multivitamin with minerals  1 tablet Oral Daily  . sildenafil  20 mg Oral TID  . sodium chloride flush  3 mL Intravenous Q12H  . spironolactone  25 mg Oral Daily   PRN Meds: acetaminophen **OR** acetaminophen, bisacodyl, calcium carbonate (dosed in mg elemental calcium), camphor-menthol **AND** hydrOXYzine, docusate sodium, feeding supplement (NEPRO CARB STEADY), hydrALAZINE, morphine injection, ondansetron **OR**  ondansetron (ZOFRAN) IV, oxyCODONE, polyethylene glycol, sorbitol, zolpidem Continuous Infusions:   LOS: 1 day  Time spent: Greater than 50% of the 35 minute visit was spent in counseling/coordination of care for the patient as laid out in the A&P.   Dwyane Dee, MD [facility] 06/08/2020, 2:20 PM

## 2020-06-08 NOTE — Assessment & Plan Note (Signed)
-  Baseline hemoglobin around 8 g/dL -Received transfusion on admission

## 2020-06-08 NOTE — Assessment & Plan Note (Addendum)
-   considered multifactorial: chf exac, anemia, severe AS, mod MS - continue O2 and wean as able; weaned to room air - continue lasix

## 2020-06-08 NOTE — Assessment & Plan Note (Addendum)
-  Again seen on repeat echo this admission -Outpatient notes indicate that patient not a good candidate for intervention on aortic or mitral valve due to underlying comorbidities

## 2020-06-08 NOTE — Assessment & Plan Note (Signed)
-   continue lipitor

## 2020-06-08 NOTE — Assessment & Plan Note (Signed)
-  Continue amiodarone -Not an anticoagulation candidate due to underlying MDS and chronic anemia

## 2020-06-08 NOTE — Assessment & Plan Note (Addendum)
Most recently seen by Dr. 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

## 2020-06-08 NOTE — Assessment & Plan Note (Addendum)
-   baseline creatinine 2.5-2.9; admitted with creat 3.21 - continue lasix and monitor renal response - improved then worsened with IV lasix and aldactone added; now on PO lasix, and spironolactone has been discontinued; agree with this; her volume status is already greatly improved as well - BMP and Mg in am -Still some worsening with renal function today.  Reaching out to nephrology for further input, greatly appreciate assistance.  For now, will monitor renal function to see if stabilizes; further plan to be determined from there -She has known bilateral renal artery stenosis, right greater than left. - after discussion with nephrology today, we think she is okay for going home with repeat labs in 1-2 days. I discussed with patient and she will set up for labs to be drawn on Tuesday and then further plans cane be made then; the hope is that they improve in the next couple days, and if they do worsen she understands she may need to return to the hospital for discussion more in depth of starting HD if warranted.

## 2020-06-08 NOTE — Progress Notes (Signed)
Progress Note  Patient Name: Patricia Mata Date of Encounter: 06/08/2020  [facility] Cardiologist: Quay Burow, MD   Subjective   Breathing is improved from yesterday. Made a lot of urine on lasix. About to get carotid studies this AM. No chest pain. No other concerns.  Inpatient Medications    Scheduled Meds: . (feeding supplement) PROSource Plus  30 mL Oral BID BM  . allopurinol  300 mg Oral Daily  . amiodarone  200 mg Oral Daily  . amLODipine  10 mg Oral Daily  . atorvastatin  40 mg Oral Daily  . carvedilol  12.5 mg Oral BID WC  . cloNIDine  0.1 mg Oral BID  . docusate sodium  100 mg Oral BID  . doxazosin  8 mg Oral Daily  . furosemide  40 mg Intravenous BID  . hydrALAZINE  100 mg Oral TID  . insulin aspart  0-9 Units Subcutaneous TID WC  . insulin glargine  18 Units Subcutaneous Daily  . latanoprost  1 drop Both Eyes QHS  . levothyroxine  150 mcg Oral Daily  . multivitamin with minerals  1 tablet Oral Daily  . sildenafil  20 mg Oral TID  . sodium chloride flush  3 mL Intravenous Q12H  . spironolactone  25 mg Oral Daily   Continuous Infusions:  PRN Meds: acetaminophen **OR** acetaminophen, bisacodyl, calcium carbonate (dosed in mg elemental calcium), camphor-menthol **AND** hydrOXYzine, docusate sodium, feeding supplement (NEPRO CARB STEADY), hydrALAZINE, morphine injection, ondansetron **OR** ondansetron (ZOFRAN) IV, oxyCODONE, polyethylene glycol, sorbitol, zolpidem   Vital Signs    Vitals:   06/08/20 0215 06/08/20 0400 06/08/20 0500 06/08/20 0752  BP: (!) 142/35 (!) 154/39  (!) 154/48  Pulse:  72  65  Resp:  20  18  Temp:  98 F (36.7 C)    TempSrc:  Oral    SpO2:  90%  94%  Weight:   100.7 kg   Height:        Intake/Output Summary (Last 24 hours) at 06/08/2020 1039 Last data filed at 06/08/2020 2979 Gross per 24 hour  Intake 2236 ml  Output 5425 ml  Net -3189 ml   Last 3 Weights 06/08/2020 06/07/2020 06/07/2020  Weight (lbs) 222  lb 0.1 oz 226 lb 6.6 oz 215 lb  Weight (kg) 100.7 kg 102.7 kg 97.523 kg      Telemetry    Sinus rhythm- Personally Reviewed  ECG    11/24 sinus rhythm - Personally Reviewed  Physical Exam   GEN: No acute distress.   Neck: No JVD visible but difficult body habitus Cardiac: regular S1 and S2. Harsh mid peaking 3/6 systolic ejection murmur. Did not appreciate diastolic murmur Respiratory: Clear to auscultation bilaterally. GI: Soft, nontender, non-distended  MS: No edema; No deformity. Neuro:  Nonfocal  Psych: Normal affect   Labs    High Sensitivity Troponin:  No results for input(s): TROPONINIHS in the last 720 hours.    Chemistry Recent Labs  Lab 06/07/20 0825 06/08/20 0233  NA 133* 137  K 4.0 4.1  CL 102 103  CO2 16* 20*  GLUCOSE 224* 154*  BUN 80* 80*  CREATININE 3.21* 3.05*  CALCIUM 9.1 9.5  PROT 7.0  --   ALBUMIN 3.6  --   AST 31  --   ALT 19  --   ALKPHOS 75  --   BILITOT 1.0  --   GFRNONAA 15* 16*  ANIONGAP 15 14     Hematology Recent Labs  Lab  06/07/20 0825 06/08/20 0233  WBC 7.1 8.1  RBC 2.45* 3.01*  HGB 7.4* 9.1*  HCT 23.1* 26.9*  MCV 94.3 89.4  MCH 30.2 30.2  MCHC 32.0 33.8  RDW 21.5* 20.1*  PLT 178 172    BNP Recent Labs  Lab 06/07/20 0826  BNP 489.2*     DDimer No results for input(s): DDIMER in the last 168 hours.   Radiology    DG Chest Port 1 View  Result Date: 06/07/2020 CLINICAL DATA:  Shortness of breath EXAM: PORTABLE CHEST 1 VIEW COMPARISON:  12/07/2019 FINDINGS: Cardiac shadow is enlarged. Vascular congestion is identified increasing parenchymal opacity likely representing edema although acute multifocal pneumonia is not excluded. Small effusions are noted bilaterally. No bony abnormality is seen. Aortic atherosclerotic changes are noted. IMPRESSION: Vascular congestion with bilateral airspace opacity likely related to underlying edema. Multifocal pneumonia cannot be totally excluded on the basis of this exam.  Continued follow-up is recommended Electronically Signed   By: Inez Catalina M.D.   On: 06/07/2020 08:33   ECHOCARDIOGRAM COMPLETE  Result Date: 06/07/2020    ECHOCARDIOGRAM REPORT   Patient Name:   Patricia Mata Date of Exam: 06/07/2020 Medical Rec #:  [id number]            Height:       64.0 in Accession #:    [id number]           Weight:       226.4 lb Date of Birth:  09-17-1955             BSA:          2.062 m Patient Age:    64 years             BP:           173/37 mmHg Patient Gender: F                    HR:           67 bpm. Exam Location:  Inpatient Procedure: 2D Echo Indications:    CHF  History:        Patient has prior history of Echocardiogram examinations, most                 recent 09/21/2019. Aortic Valve Disease; Risk Factors:Hypertension                 and Diabetes.  Sonographer:    Mikki Santee RDCS (AE) Referring Phys: [place]  1. Left ventricular ejection fraction, by estimation, is 60 to 65%. The left ventricle has normal function. The left ventricle has no regional wall motion abnormalities. There is moderate concentric left ventricular hypertrophy. Left ventricular diastolic parameters are consistent with Grade II diastolic dysfunction (pseudonormalization). Elevated left ventricular end-diastolic pressure.  2. Right ventricular systolic function is normal. The right ventricular size is normal. There is moderately elevated pulmonary artery systolic pressure. The estimated right ventricular systolic pressure is 84.6 mmHg.  3. Left atrial size was moderately dilated.  4. Right atrial size was severely dilated.  5. 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 The mean mitral valve gradient is 10.0 mmHg. Severe mitral annular calcification.  6. Tricuspid valve regurgitation is mild to moderate.  7. The aortic valve is calcified. Aortic valve regurgitation is trivial. Severe aortic valve stenosis. Aortic valve  area, by VTI measures 0.84 cm. Aortic valve Vmax measures 4.07 m/s.  8.  The inferior vena cava is dilated in size with <50% respiratory variability, suggesting right atrial pressure of 15 mmHg. Comparison(s): No significant change from prior study. FINDINGS  Left Ventricle: Left ventricular ejection fraction, by estimation, is 60 to 65%. The left ventricle has normal function. The left ventricle has no regional wall motion abnormalities. The left ventricular internal cavity size was normal in size. There is  moderate concentric left ventricular hypertrophy. Left ventricular diastolic parameters are consistent with Grade II diastolic dysfunction (pseudonormalization). Elevated left ventricular end-diastolic pressure. Right Ventricle: The right ventricular size is normal. Right vetricular wall thickness was not well visualized. Right ventricular systolic function is normal. There is moderately elevated pulmonary artery systolic pressure. The tricuspid regurgitant velocity is 3.14 m/s, and with an assumed right atrial pressure of 15 mmHg, the estimated right ventricular systolic pressure is 93.7 mmHg. Left Atrium: Left atrial size was moderately dilated. Right Atrium: Right atrial size was severely dilated. Pericardium: Trivial pericardial effusion is present. Mitral Valve: Mean gradient is 10 mmHg at 64 bpm, but valve area is 2.50 cm2 by PHT. The mitral valve is rheumatic. There is moderate thickening of the mitral valve leaflet(s). There is moderate calcification of the mitral valve leaflet(s). Severe mitral  annular calcification. Mild mitral valve regurgitation. Moderate mitral valve stenosis. MV peak gradient, 32.5 mmHg. The mean mitral valve gradient is 10.0 mmHg. Tricuspid Valve: The tricuspid valve is normal in structure. Tricuspid valve regurgitation is mild to moderate. No evidence of tricuspid stenosis. Aortic Valve: The aortic valve is calcified. Aortic valve regurgitation is trivial. Aortic regurgitation  PHT measures 448 msec. Severe aortic stenosis is present. Aortic valve mean gradient measures 35.0 mmHg. Aortic valve peak gradient measures 66.3 mmHg. Aortic valve area, by VTI measures 0.84 cm. Pulmonic Valve: The pulmonic valve was not well visualized. Pulmonic valve regurgitation is trivial. No evidence of pulmonic stenosis. Aorta: The aortic root and ascending aorta are structurally normal, with no evidence of dilitation. Venous: The inferior vena cava is dilated in size with less than 50% respiratory variability, suggesting right atrial pressure of 15 mmHg. IAS/Shunts: The atrial septum is grossly normal.  LEFT VENTRICLE PLAX 2D LVIDd:         4.40 cm  Diastology LVIDs:         2.80 cm  LV e' medial:    6.22 cm/s LV PW:         1.40 cm  LV E/e' medial:  30.7 LV IVS:        1.30 cm  LV e' lateral:   8.59 cm/s LVOT diam:     2.00 cm  LV E/e' lateral: 22.2 LV SV:         89 LV SV Index:   43 LVOT Area:     3.14 cm  RIGHT VENTRICLE TAPSE (M-mode): 1.9 cm LEFT ATRIUM             Index       RIGHT ATRIUM           Index LA diam:        5.00 cm 2.42 cm/m  RA Area:     29.70 cm LA Vol (A2C):   83.6 ml 40.54 ml/m RA Volume:   100.00 ml 48.49 ml/m LA Vol (A4C):   72.3 ml 35.06 ml/m LA Biplane Vol: 79.0 ml 38.31 ml/m  AORTIC VALVE AV Area (Vmax):    0.71 cm AV Area (Vmean):   0.71 cm AV Area (VTI):     0.84  cm AV Vmax:           407.00 cm/s AV Vmean:          280.000 cm/s AV VTI:            1.050 m AV Peak Grad:      66.3 mmHg AV Mean Grad:      35.0 mmHg LVOT Vmax:         91.40 cm/s LVOT Vmean:        62.900 cm/s LVOT VTI:          0.282 m LVOT/AV VTI ratio: 0.27 AI PHT:            448 msec  AORTA Ao Root diam: 3.20 cm MITRAL VALVE                TRICUSPID VALVE MV Area (PHT): 2.76 cm     TR Peak grad:   39.4 mmHg MV Peak grad:  32.5 mmHg    TR Vmax:        314.00 cm/s MV Mean grad:  10.0 mmHg MV Vmax:       2.85 m/s     SHUNTS MV Vmean:      140.0 cm/s   Systemic VTI:  0.28 m MV Decel Time: 275 msec      Systemic Diam: 2.00 cm MV E velocity: 191.00 cm/s MV A velocity: 107.00 cm/s MV E/A ratio:  1.79 Buford Dresser MD Electronically signed by Buford Dresser MD Signature Date/Time: 06/07/2020/7:40:21 PM    Final     Cardiac Studies   Echo 05/12/18: - Left ventricle: The cavity size was normal. There was moderate  concentric hypertrophy. Systolic function was vigorous. The  estimated ejection fraction was in the range of 65% to 70%. Wall  motion was normal; there were no regional wall motion  abnormalities. Features are consistent with a pseudonormal left  ventricular filling pattern, with concomitant abnormal relaxation  and increased filling pressure (grade 2 diastolic dysfunction).  Doppler parameters are consistent with high ventricular filling  pressure.  - Aortic valve: Trileaflet; moderately thickened, moderately  calcified leaflets. Valve mobility was restricted. There was  moderate stenosis. Mean gradient (S): 22 mm Hg. VTI ratio of LVOT  to aortic valve: 0.37. Valve area (VTI): 1.28 cm^2. Valve area  (Vmax): 1.01 cm^2. Valve area (Vmean): 1.18 cm^2.  - Mitral valve: Calcified annulus. Moderate diffuse thickening,  calcification, and elongation. The findings are consistent with  mild to moderate stenosis. Deceleration time: 324 ms.  - Tricuspid valve: There was moderate regurgitation.  - Pulmonic valve: There was trivial regurgitation.  - Pulmonary arteries: PA peak pressure: 93 mm Hg (S).  Echo 09/21/19: 1. Left ventricular ejection fraction, by estimation, is 65 to 70%. The  left ventricle has hyperdynamic function. The left ventricle has no  regional wall motion abnormalities. Left ventricular diastolic parameters  are consistent with Grade II diastolic  dysfunction (pseudonormalization). Elevated left atrial pressure.  2. Right ventricular systolic function is normal. The right ventricular  size is normal. There is moderately  elevated pulmonary artery systolic  pressure.  3. Left atrial size was severely dilated.  4. The mitral valve is rheumatic. Mild to moderate mitral valve  regurgitation. Mild to moderate mitral stenosis. The mean mitral valve  gradient is 9.4 mmHg with average heart rate of 81 bpm.  5. The aortic valve has an indeterminant number of cusps. Aortic valve  regurgitation is trivial. Severe aortic valve stenosis. Aortic valve area,  by  VTI measures 0.82 cm. Aortic valve mean gradient measures 42.0 mmHg.  Aortic valve Vmax measures 4.32  m/s.  6. The inferior vena cava is normal in size with greater than 50%  respiratory variability, suggesting right atrial pressure of 3 mmHg.  7. Agitated saline contrast bubble study was negative, with no evidence  of any interatrial shunt.   Comparison(s): Prior images reviewed side by side. Changes from prior  study are noted. Increased mitral gradients are partly explained by  increased heart rate and cardiac output.   LHC/RHC 05/21/18: Conclusions: 1. No angiographically significant coronary artery disease. 2. Severe pulmonary hypertension (mean PAP 62 mmHg; PVR 5.4 WU). 3. Mildly elevated left heart filling pressure. 4. Moderately elevated right heart filling pressure. Right atrial waveform has an "M" or "W" appearance that suggests restrictive/contrictive physiology. 5. Moderate aortic stenosis. 6. Probably moderate mitral stenosis. Transmitral gradient (12 mmHg) may be accentuated by mitral regurgitation and anemia. Prominent v-waves also suggest a degree of mitral regurgitation. 7. Normal to supranormal Fick cardiac output/index.  Recommendations: 1. Anticipate restarting diuresis tomorrow, as renal function allows following contrast administration today. 2. Consider advanced heart failure consultation to assist with management of HFpEF and severe pulmonary hypertension. I do not believe that valvular disease is the primary cause of the  patient's symptoms. 3. Primary prevention of coronary artery disease. RA (mean): 14 mmHg RV (S/EDP): 110/15 mmHg PA (S/D, mean): 105/40 (62) mmHg PCWP (mean): 25 mmHg Ao sat: 91% PA sat: 53 Fick CO: 6.8 L/min Fick CI: 4.3 L/min/m^2 PVR: 5.4 Wood units  RHC 10/20 RA = 8 RV = 53/3 PA = 57/12 (32) PCW = 14 (v waves to 32) Fick cardiac output/index = 8.4/4.5 PVR = 2.1 WU Ao sat = 99% PA sat = 73%, 74% High SVC sat = 74%  Renal artery Dopplers 02/08/20: Right: Normal size right kidney. Abnormal right Resistive Index.     Normal cortical thickness of right kidney. Evidence of a     greater than 60% stenosis of the right renal artery (low end     of range based on peak velocity). RRV flow present.  Left: Normal size of left kidney. Abnormal left Resisitve Index.     Normal cortical thickness of the left kidney. 1-59% stenosis     of the left renal artery. LRV flow present.  Mesenteric:  70 to 99% stenosis in the celiac artery and superior mesenteric artery.   Patient Profile     64 y.o. female with a hx of severe aortic stenosis, moderate mitral stenosis, pulmonary hypertension followed by Dr. Haroldine Laws, paroxysmal atrial fibrillation not on anticoagulation, hypertension, diabetes, myelodysplasitc syndrome, transfusion dependent anemia, and prior colon cancer s/p hemicolectomy who is being followed in consultation for the evaluation of CHF at the request of Dr. Lorin Mercy.  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 MS severe by gradient and moderate by PHT. AS severe by area and peak velocity. -strict I/O, daily weights -admission weight 102.7 kg, weight today 100.7 kg -net negative 3L -she feels that her breathing improved with diuresis. Attempting to wean O2 -see below re: diuresis and CKD. Cr improving  with diuresis, continue furosemide 40 mg IV BID for today. -per prior outpatient notes, not felt to be a candidate for intervention on her AS or MS due to her comorbidities.   CKD Stage 4 Hypertension Renal artery stenosis -  blood pressure has been variable this admission -Cr 3.05 today, was 3.21 on admission. Baseline runs 2.5-2.9, which has been CKD 4 with GFR 19-26. -continue amlodipine, carvedilol, clonidine, doxazosin, hydralazine -caution with diuresis and spironolactone given her renal function. Monitor Cr and K closely  Paroxysmal atrial fibrillation Anemia, chronic myelodysplastic syndrome -continue amiodarone -not on anticoagulation due to chronic anemia/GI bleeding -anemia management per primary team, s/p transfusion  Palliative care consult was obtained this admission. Given that she is not a candidate for intervention based on previous discussion, this is a reasonable approach. She does not that she wishes to be full code and pursue aggressive interventions at this time. Would continue diuresis and monitor renal function and electrolytes closely.   For questions or updates, please contact [place] Please consult [email or web address] for contact info under     Signed, Buford Dresser, MD  06/08/2020, 10:39 AM

## 2020-06-08 NOTE — Assessment & Plan Note (Addendum)
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

## 2020-06-08 NOTE — Plan of Care (Signed)
  Problem: Education: Goal: Ability to demonstrate management of disease process will improve Outcome: Progressing Goal: Ability to verbalize understanding of medication therapies will improve Outcome: Progressing Goal: Individualized Educational Video(s) Outcome: Progressing   Problem: Activity: Goal: Capacity to carry out activities will improve Outcome: Progressing   Problem: Cardiac: Goal: Ability to achieve and maintain adequate cardiopulmonary perfusion will improve Outcome: Progressing   Problem: Education: Goal: Ability to identify signs and symptoms of gastrointestinal bleeding will improve Outcome: Progressing   Problem: Bowel/Gastric: Goal: Will show no signs and symptoms of gastrointestinal bleeding Outcome: Progressing   Problem: Fluid Volume: Goal: Will show no signs and symptoms of excessive bleeding Outcome: Progressing   Problem: Clinical Measurements: Goal: Complications related to the disease process, condition or treatment will be avoided or minimized Outcome: Progressing   Problem: Education: Goal: Knowledge of General Education information will improve Description: Including pain rating scale, medication(s)/side effects and non-pharmacologic comfort measures Outcome: Progressing   Problem: Health Behavior/Discharge Planning: Goal: Ability to manage health-related needs will improve Outcome: Progressing   Problem: Clinical Measurements: Goal: Ability to maintain clinical measurements within normal limits will improve Outcome: Progressing Goal: Will remain free from infection Outcome: Progressing Goal: Diagnostic test results will improve Outcome: Progressing Goal: Respiratory complications will improve Outcome: Progressing Goal: Cardiovascular complication will be avoided Outcome: Progressing   Problem: Activity: Goal: Risk for activity intolerance will decrease Outcome: Progressing   Problem: Nutrition: Goal: Adequate nutrition will be  maintained Outcome: Progressing   Problem: Coping: Goal: Level of anxiety will decrease Outcome: Progressing   Problem: Elimination: Goal: Will not experience complications related to bowel motility Outcome: Progressing Goal: Will not experience complications related to urinary retention Outcome: Progressing   Problem: Pain Managment: Goal: General experience of comfort will improve Outcome: Progressing   Problem: Safety: Goal: Ability to remain free from injury will improve Outcome: Progressing   Problem: Skin Integrity: Goal: Risk for impaired skin integrity will decrease Outcome: Progressing   

## 2020-06-08 NOTE — Assessment & Plan Note (Signed)
-   continue SSI and CBG monitoring  

## 2020-06-08 NOTE — Assessment & Plan Note (Addendum)
-   worsening SOB, LE edema, and hypoxia on admission - echo repeat ordered on admission: EF 60-65%, Gr2DD, mod LVH, mod MS, severe AS - cardiology following as well - continue lasix; changed to p.o. due to slightly worsening renal function.   -Spironolactone also discontinued due to to worsening renal function - home lasix continued at discharge

## 2020-06-09 ENCOUNTER — Inpatient Hospital Stay: Payer: Medicare Other

## 2020-06-09 DIAGNOSIS — I350 Nonrheumatic aortic (valve) stenosis: Secondary | ICD-10-CM | POA: Diagnosis not present

## 2020-06-09 DIAGNOSIS — Z515 Encounter for palliative care: Secondary | ICD-10-CM

## 2020-06-09 DIAGNOSIS — J9601 Acute respiratory failure with hypoxia: Secondary | ICD-10-CM

## 2020-06-09 DIAGNOSIS — I4891 Unspecified atrial fibrillation: Secondary | ICD-10-CM | POA: Diagnosis not present

## 2020-06-09 DIAGNOSIS — D469 Myelodysplastic syndrome, unspecified: Secondary | ICD-10-CM | POA: Diagnosis not present

## 2020-06-09 DIAGNOSIS — I050 Rheumatic mitral stenosis: Secondary | ICD-10-CM

## 2020-06-09 LAB — CBC WITH DIFFERENTIAL/PLATELET
Abs Immature Granulocytes: 0.05 10*3/uL (ref 0.00–0.07)
Basophils Absolute: 0 10*3/uL (ref 0.0–0.1)
Basophils Relative: 0 %
Eosinophils Absolute: 0.1 10*3/uL (ref 0.0–0.5)
Eosinophils Relative: 1 %
HCT: 25.6 % — ABNORMAL LOW (ref 36.0–46.0)
Hemoglobin: 8.7 g/dL — ABNORMAL LOW (ref 12.0–15.0)
Immature Granulocytes: 1 %
Lymphocytes Relative: 6 %
Lymphs Abs: 0.4 10*3/uL — ABNORMAL LOW (ref 0.7–4.0)
MCH: 30.6 pg (ref 26.0–34.0)
MCHC: 34 g/dL (ref 30.0–36.0)
MCV: 90.1 fL (ref 80.0–100.0)
Monocytes Absolute: 1.2 10*3/uL — ABNORMAL HIGH (ref 0.1–1.0)
Monocytes Relative: 19 %
Neutro Abs: 4.8 10*3/uL (ref 1.7–7.7)
Neutrophils Relative %: 73 %
Platelets: 162 10*3/uL (ref 150–400)
RBC: 2.84 MIL/uL — ABNORMAL LOW (ref 3.87–5.11)
RDW: 20.6 % — ABNORMAL HIGH (ref 11.5–15.5)
WBC: 6.5 10*3/uL (ref 4.0–10.5)
nRBC: 1.5 % — ABNORMAL HIGH (ref 0.0–0.2)

## 2020-06-09 LAB — BASIC METABOLIC PANEL
Anion gap: 15 (ref 5–15)
BUN: 89 mg/dL — ABNORMAL HIGH (ref 8–23)
CO2: 20 mmol/L — ABNORMAL LOW (ref 22–32)
Calcium: 9.7 mg/dL (ref 8.9–10.3)
Chloride: 104 mmol/L (ref 98–111)
Creatinine, Ser: 3.35 mg/dL — ABNORMAL HIGH (ref 0.44–1.00)
GFR, Estimated: 15 mL/min — ABNORMAL LOW (ref >60)
Glucose, Bld: 86 mg/dL (ref 70–99)
Potassium: 3.9 mmol/L (ref 3.5–5.1)
Sodium: 139 mmol/L (ref 135–145)

## 2020-06-09 LAB — GLUCOSE, CAPILLARY
Glucose-Capillary: 82 mg/dL (ref 70–99)
Glucose-Capillary: 160 mg/dL — ABNORMAL HIGH (ref 70–99)
Glucose-Capillary: 123 mg/dL — ABNORMAL HIGH (ref 70–99)

## 2020-06-09 LAB — MAGNESIUM: Magnesium: 2 mg/dL (ref 1.7–2.4)

## 2020-06-09 MED ORDER — FUROSEMIDE 40 MG PO TABS
40.0000 mg | ORAL_TABLET | Freq: Every day | ORAL | Status: DC
  Administered 2020-06-10 – 2020-06-11 (×2): 40 mg via ORAL
  Filled 2020-06-09 (×2): qty 1

## 2020-06-09 NOTE — Progress Notes (Signed)
Progress Note  Patient Name: Patricia Mata Date of Encounter: 06/09/2020  [facility] Cardiologist: Quay Burow, MD   Subjective   Breathing has improved.  Patient notes she can walk from the bathroom to the bed without SOB, feels close to her baseline when she was 217 lbs.  Inpatient Medications    Scheduled Meds: . (feeding supplement) PROSource Plus  30 mL Oral BID BM  . allopurinol  300 mg Oral Daily  . amiodarone  200 mg Oral Daily  . amLODipine  10 mg Oral Daily  . atorvastatin  40 mg Oral Daily  . carvedilol  12.5 mg Oral BID WC  . cloNIDine  0.1 mg Oral BID  . docusate sodium  100 mg Oral BID  . doxazosin  8 mg Oral Daily  . furosemide  40 mg Intravenous BID  . hydrALAZINE  100 mg Oral TID  . insulin aspart  0-9 Units Subcutaneous TID WC  . insulin glargine  18 Units Subcutaneous Daily  . latanoprost  1 drop Both Eyes QHS  . levothyroxine  150 mcg Oral Daily  . multivitamin with minerals  1 tablet Oral Daily  . sildenafil  20 mg Oral TID  . sodium chloride flush  3 mL Intravenous Q12H  . spironolactone  25 mg Oral Daily   Continuous Infusions:   PRN Meds: acetaminophen **OR** acetaminophen, bisacodyl, calcium carbonate (dosed in mg elemental calcium), camphor-menthol **AND** hydrOXYzine, docusate sodium, feeding supplement (NEPRO CARB STEADY), hydrALAZINE, morphine injection, ondansetron **OR** ondansetron (ZOFRAN) IV, oxyCODONE, polyethylene glycol, sorbitol, zolpidem   Vital Signs    Vitals:   06/08/20 1308 06/08/20 1954 06/09/20 0632 06/09/20 0725  BP: (!) 137/46 (!) 136/43 (!) 138/40 (!) 134/30  Pulse: (!) 53 64 74 67  Resp: 18 18 18 20   Temp: 97.8 F (36.6 C) 98.3 F (36.8 C) 98.2 F (36.8 C) 98.6 F (37 C)  TempSrc: Oral Oral Oral Oral  SpO2: 93% 92% 92% 93%  Weight:   99.4 kg   Height:        Intake/Output Summary (Last 24 hours) at 06/09/2020 0932 Last data filed at 06/09/2020 0457 Gross per 24 hour  Intake 840 ml  Output  1900 ml  Net -1060 ml   Last 3 Weights 06/09/2020 06/08/2020 06/07/2020  Weight (lbs) 219 lb 2.2 oz 222 lb 0.1 oz 226 lb 6.6 oz  Weight (kg) 99.4 kg 100.7 kg 102.7 kg      Telemetry    SR- Personally Reviewed  ECG    No new since 11/24 - Personally Reviewed  Physical Exam   GEN: No acute distress.   Neck: No JVD visible but difficult body habitus Cardiac: regular S1 and S2. Harsh mid peaking 3/6 systolic ejection murmur. Subtle I/IV diastolic rumble Respiratory: Clear to auscultation bilaterally. GI: Soft, nontender, non-distended  MS: No edema; No deformity. Neuro:  Nonfocal  Psych: Normal affect   Labs    High Sensitivity Troponin:  No results for input(s): TROPONINIHS in the last 720 hours.    Chemistry Recent Labs  Lab 06/07/20 0825 06/08/20 0233 06/09/20 0300  NA 133* 137 139  K 4.0 4.1 3.9  CL 102 103 104  CO2 16* 20* 20*  GLUCOSE 224* 154* 86  BUN 80* 80* 89*  CREATININE 3.21* 3.05* 3.35*  CALCIUM 9.1 9.5 9.7  PROT 7.0  --   --   ALBUMIN 3.6  --   --   AST 31  --   --  ALT 19  --   --   ALKPHOS 75  --   --   BILITOT 1.0  --   --   GFRNONAA 15* 16* 15*  ANIONGAP 15 14 15      Hematology Recent Labs  Lab 06/07/20 0825 06/08/20 0233 06/09/20 0300  WBC 7.1 8.1 6.5  RBC 2.45* 3.01* 2.84*  HGB 7.4* 9.1* 8.7*  HCT 23.1* 26.9* 25.6*  MCV 94.3 89.4 90.1  MCH 30.2 30.2 30.6  MCHC 32.0 33.8 34.0  RDW 21.5* 20.1* 20.6*  PLT 178 172 162    BNP Recent Labs  Lab 06/07/20 0826  BNP 489.2*     DDimer No results for input(s): DDIMER in the last 168 hours.   Radiology    ECHOCARDIOGRAM COMPLETE  Result Date: 06/07/2020    ECHOCARDIOGRAM REPORT   Patient Name:   Patricia Mata Date of Exam: 06/07/2020 Medical Rec #:  [id number]            Height:       64.0 in Accession #:    [id number]           Weight:       226.4 lb Date of Birth:  21-Jul-1955             BSA:          2.062 m Patient Age:    64 years             BP:           173/37 mmHg  Patient Gender: F                    HR:           67 bpm. Exam Location:  Inpatient Procedure: 2D Echo Indications:    CHF  History:        Patient has prior history of Echocardiogram examinations, most                 recent 09/21/2019. Aortic Valve Disease; Risk Factors:Hypertension                 and Diabetes.  Sonographer:    Mikki Santee RDCS (AE) Referring Phys: [place]  1. Left ventricular ejection fraction, by estimation, is 60 to 65%. The left ventricle has normal function. The left ventricle has no regional wall motion abnormalities. There is moderate concentric left ventricular hypertrophy. Left ventricular diastolic parameters are consistent with Grade II diastolic dysfunction (pseudonormalization). Elevated left ventricular end-diastolic pressure.  2. Right ventricular systolic function is normal. The right ventricular size is normal. There is moderately elevated pulmonary artery systolic pressure. The estimated right ventricular systolic pressure is 23.5 mmHg.  3. Left atrial size was moderately dilated.  4. Right atrial size was severely dilated.  5. 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 The mean mitral valve gradient is 10.0 mmHg. Severe mitral annular calcification.  6. Tricuspid valve regurgitation is mild to moderate.  7. The aortic valve is calcified. Aortic valve regurgitation is trivial. Severe aortic valve stenosis. Aortic valve area, by VTI measures 0.84 cm. Aortic valve Vmax measures 4.07 m/s.  8. The inferior vena cava is dilated in size with <50% respiratory variability, suggesting right atrial pressure of 15 mmHg. Comparison(s): No significant change from prior study. FINDINGS  Left Ventricle: Left ventricular ejection fraction, by estimation, is 60 to 65%. The left ventricle has normal function. The left ventricle  has no regional wall motion abnormalities. The left ventricular internal cavity size was  normal in size. There is  moderate concentric left ventricular hypertrophy. Left ventricular diastolic parameters are consistent with Grade II diastolic dysfunction (pseudonormalization). Elevated left ventricular end-diastolic pressure. Right Ventricle: The right ventricular size is normal. Right vetricular wall thickness was not well visualized. Right ventricular systolic function is normal. There is moderately elevated pulmonary artery systolic pressure. The tricuspid regurgitant velocity is 3.14 m/s, and with an assumed right atrial pressure of 15 mmHg, the estimated right ventricular systolic pressure is 32.9 mmHg. Left Atrium: Left atrial size was moderately dilated. Right Atrium: Right atrial size was severely dilated. Pericardium: Trivial pericardial effusion is present. Mitral Valve: Mean gradient is 10 mmHg at 64 bpm, but valve area is 2.50 cm2 by PHT. The mitral valve is rheumatic. There is moderate thickening of the mitral valve leaflet(s). There is moderate calcification of the mitral valve leaflet(s). Severe mitral  annular calcification. Mild mitral valve regurgitation. Moderate mitral valve stenosis. MV peak gradient, 32.5 mmHg. The mean mitral valve gradient is 10.0 mmHg. Tricuspid Valve: The tricuspid valve is normal in structure. Tricuspid valve regurgitation is mild to moderate. No evidence of tricuspid stenosis. Aortic Valve: The aortic valve is calcified. Aortic valve regurgitation is trivial. Aortic regurgitation PHT measures 448 msec. Severe aortic stenosis is present. Aortic valve mean gradient measures 35.0 mmHg. Aortic valve peak gradient measures 66.3 mmHg. Aortic valve area, by VTI measures 0.84 cm. Pulmonic Valve: The pulmonic valve was not well visualized. Pulmonic valve regurgitation is trivial. No evidence of pulmonic stenosis. Aorta: The aortic root and ascending aorta are structurally normal, with no evidence of dilitation. Venous: The inferior vena cava is dilated in size with  less than 50% respiratory variability, suggesting right atrial pressure of 15 mmHg. IAS/Shunts: The atrial septum is grossly normal.  LEFT VENTRICLE PLAX 2D LVIDd:         4.40 cm  Diastology LVIDs:         2.80 cm  LV e' medial:    6.22 cm/s LV PW:         1.40 cm  LV E/e' medial:  30.7 LV IVS:        1.30 cm  LV e' lateral:   8.59 cm/s LVOT diam:     2.00 cm  LV E/e' lateral: 22.2 LV SV:         89 LV SV Index:   43 LVOT Area:     3.14 cm  RIGHT VENTRICLE TAPSE (M-mode): 1.9 cm LEFT ATRIUM             Index       RIGHT ATRIUM           Index LA diam:        5.00 cm 2.42 cm/m  RA Area:     29.70 cm LA Vol (A2C):   83.6 ml 40.54 ml/m RA Volume:   100.00 ml 48.49 ml/m LA Vol (A4C):   72.3 ml 35.06 ml/m LA Biplane Vol: 79.0 ml 38.31 ml/m  AORTIC VALVE AV Area (Vmax):    0.71 cm AV Area (Vmean):   0.71 cm AV Area (VTI):     0.84 cm AV Vmax:           407.00 cm/s AV Vmean:          280.000 cm/s AV VTI:            1.050 m AV Peak Grad:  66.3 mmHg AV Mean Grad:      35.0 mmHg LVOT Vmax:         91.40 cm/s LVOT Vmean:        62.900 cm/s LVOT VTI:          0.282 m LVOT/AV VTI ratio: 0.27 AI PHT:            448 msec  AORTA Ao Root diam: 3.20 cm MITRAL VALVE                TRICUSPID VALVE MV Area (PHT): 2.76 cm     TR Peak grad:   39.4 mmHg MV Peak grad:  32.5 mmHg    TR Vmax:        314.00 cm/s MV Mean grad:  10.0 mmHg MV Vmax:       2.85 m/s     SHUNTS MV Vmean:      140.0 cm/s   Systemic VTI:  0.28 m MV Decel Time: 275 msec     Systemic Diam: 2.00 cm MV E velocity: 191.00 cm/s MV A velocity: 107.00 cm/s MV E/A ratio:  1.79 Buford Dresser MD Electronically signed by Buford Dresser MD Signature Date/Time: 06/07/2020/7:40:21 PM    Final    VAS US CAROTID  Result Date: 06/08/2020 Carotid Arterial Duplex Study Indications:       History of carotid artery stenosis. Risk Factors:      Hypertension, Diabetes. Other Factors:     Severe aortic valve stenosis, rheumatic mitral valve with                     moderate mitral stenosis. Limitations        Today's exam was limited due to the high bifurcation of the                    carotid, the body habitus of the patient, heavy calcification                    and the resulting shadowing and the patient's respiratory                    variation. Comparison Study:  06/03/2019 Carotid artery duplex- occluded right ICA, left                    ICA was noted to have 40-59% stenosis, however this was                    diagnosed at the mid ICA for which criteria does not apply.                    Proximal left ICA velocity was documented at 180/35 cm/s,                    consistent with upper range 1-39% stenosis. Performing Technologist: Maudry Mayhew MHA, RDMS, RVT, RDCS  Examination Guidelines: A complete evaluation includes B-mode imaging, spectral Doppler, color Doppler, and power Doppler as needed of all accessible portions of each vessel. Bilateral testing is considered an integral part of a complete examination. Limited examinations for reoccurring indications may be performed as noted.  Right Carotid Findings: +----------+--------+--------+--------+---------------------+------------------+           PSV cm/sEDV cm/sStenosisPlaque Description   Comments           +----------+--------+--------+--------+---------------------+------------------+ CCA Prox  33  smooth and                                                                heterogenous                            +----------+--------+--------+--------+---------------------+------------------+ CCA Distal15                      smooth and                                                                heterogenous                            +----------+--------+--------+--------+---------------------+------------------+ ICA Prox  17                                           thumped waveform    +----------+--------+--------+--------+---------------------+------------------+ ICA Distal12                                           thumped waveform   +----------+--------+--------+--------+---------------------+------------------+ ECA       12                      heterogenous and     Thumped waveform                                     irregular            with diastolic                                                            reversal           +----------+--------+--------+--------+---------------------+------------------+ +----------+--------+-------+---------+-------------------+           PSV cm/sEDV cmsDescribe Arm Pressure (mmHG) +----------+--------+-------+---------+-------------------+ Subclavian272            Turbulent                    +----------+--------+-------+---------+-------------------+ +---------+--------+---+--------+--+---------+ VertebralPSV cm/s127EDV cm/s17Antegrade +---------+--------+---+--------+--+---------+  Left Carotid Findings: +----------+-------+-------+--------+---------------------------------+--------+           PSV    EDV    StenosisPlaque Description               Comments           cm/s   cm/s                                                     +----------+-------+-------+--------+---------------------------------+--------+  CCA Prox  213    33             smooth and heterogenous                   +----------+-------+-------+--------+---------------------------------+--------+ CCA Distal154    31             heterogenous, irregular and                                               calcific                                  +----------+-------+-------+--------+---------------------------------+--------+ ICA Prox  243    33             heterogenous and calcific                 +----------+-------+-------+--------+---------------------------------+--------+ ICA Mid   279    37                                                        +----------+-------+-------+--------+---------------------------------+--------+ ICA Distal209    33                                                       +----------+-------+-------+--------+---------------------------------+--------+ ECA       375    29     >50%    heterogenous and calcific                 +----------+-------+-------+--------+---------------------------------+--------+ +----------+--------+--------+----------------+-------------------+           PSV cm/sEDV cm/sDescribe        Arm Pressure (mmHG) +----------+--------+--------+----------------+-------------------+ [id number]             Multiphasic, WNL                    +----------+--------+--------+----------------+-------------------+ +---------+--------+---+--------+--+---------+ VertebralPSV cm/s146EDV cm/s26Antegrade +---------+--------+---+--------+--+---------+   Summary: Right Carotid: Previous carotid artery duplex noted occlusion of right ICA,                however a dampened waveform was detected by pulsed wave Doppler                in the proximal and distal ICA, suggestive of likely near                occlusion with possible distal occlusion. Left Carotid: Peak systolic ICA velocities suggestive of 60-79% stenosis,               however end diastolic velocities suggest upper range 1-39%               stenosis. Increased perfusion may be secondary to compensatory               flow, considering right ICA disease. Vertebrals:  Bilateral vertebral arteries demonstrate antegrade flow. Subclavians: Right subclavian artery flow was disturbed. Normal flow  hemodynamics were seen in the left subclavian artery. *See table(s) above for measurements and observations.  Electronically signed by Antony Contras MD on 06/08/2020 at 11:43:24 AM.    Final     Cardiac Studies   IMPRESSIONS     1. Left ventricular ejection fraction, by  estimation, is 60 to 65%. The  left ventricle has normal function. The left ventricle has no regional  wall motion abnormalities. There is moderate concentric left ventricular  hypertrophy. Left ventricular  diastolic parameters are consistent with Grade II diastolic dysfunction  (pseudonormalization). Elevated left ventricular end-diastolic pressure.   2. Right ventricular systolic function is normal. The right ventricular  size is normal. There is moderately elevated pulmonary artery systolic  pressure. The estimated right ventricular systolic pressure is 01.7 mmHg.   3. Left atrial size was moderately dilated.   4. Right atrial size was severely dilated.   5. The mitral valve is rheumatic. Mild mitral valve regurgitation.  Moderate mitral stenosis. The mean mitral valve gradient is 10.0 mmHg.  Severe mitral annular calcification.   6. Tricuspid valve regurgitation is mild to moderate.   7. The aortic valve is calcified. Aortic valve regurgitation is trivial.  Severe aortic valve stenosis. Aortic valve area, by VTI measures 0.84 cm.  Aortic valve Vmax measures 4.07 m/s.   8. The inferior vena cava is dilated in size with <50% respiratory  variability, suggesting right atrial pressure of 15 mmHg.   Echo 05/12/18: - Left ventricle: The cavity size was normal. There was moderate    concentric hypertrophy. Systolic function was vigorous. The    estimated ejection fraction was in the range of 65% to 70%. Wall    motion was normal; there were no regional wall motion    abnormalities. Features are consistent with a pseudonormal left    ventricular filling pattern, with concomitant abnormal relaxation    and increased filling pressure (grade 2 diastolic dysfunction).    Doppler parameters are consistent with high ventricular filling    pressure.  - Aortic valve: Trileaflet; moderately thickened, moderately    calcified leaflets. Valve mobility was restricted. There was    moderate  stenosis. Mean gradient (S): 22 mm Hg. VTI ratio of LVOT    to aortic valve: 0.37. Valve area (VTI): 1.28 cm^2. Valve area    (Vmax): 1.01 cm^2. Valve area (Vmean): 1.18 cm^2.  - Mitral valve: Calcified annulus. Moderate diffuse thickening,    calcification, and elongation. The findings are consistent with    mild to moderate stenosis. Deceleration time: 324 ms.  - Tricuspid valve: There was moderate regurgitation.  - Pulmonic valve: There was trivial regurgitation.  - Pulmonary arteries: PA peak pressure: 93 mm Hg (S).   Echo 09/21/19:  1. Left ventricular ejection fraction, by estimation, is 65 to 70%. The  left ventricle has hyperdynamic function. The left ventricle has no  regional wall motion abnormalities. Left ventricular diastolic parameters  are consistent with Grade II diastolic  dysfunction (pseudonormalization). Elevated left atrial pressure.   2. Right ventricular systolic function is normal. The right ventricular  size is normal. There is moderately elevated pulmonary artery systolic  pressure.   3. Left atrial size was severely dilated.   4. The mitral valve is rheumatic. Mild to moderate mitral valve  regurgitation. Mild to moderate mitral stenosis. The mean mitral valve  gradient is 9.4 mmHg with average heart rate of 81 bpm.   5. The aortic valve has an indeterminant number of cusps. Aortic valve  regurgitation is trivial. Severe aortic valve stenosis. Aortic valve area,  by VTI measures 0.82 cm. Aortic valve mean gradient measures 42.0 mmHg.  Aortic valve Vmax measures 4.32  m/s.   6. The inferior vena cava is normal in size with greater than 50%  respiratory variability, suggesting right atrial pressure of 3 mmHg.   7. Agitated saline contrast bubble study was negative, with no evidence  of any interatrial shunt.   Comparison(s): Prior images reviewed side by side. Changes from prior  study are noted. Increased mitral gradients are partly explained by  increased  heart rate and cardiac output.    LHC/RHC 05/21/18: Conclusions: 1. No angiographically significant coronary artery disease. 2. Severe pulmonary hypertension (mean PAP 62 mmHg; PVR 5.4 WU). 3. Mildly elevated left heart filling pressure. 4. Moderately elevated right heart filling pressure.  Right atrial waveform has an "M" or "W" appearance that suggests restrictive/contrictive physiology. 5. Moderate aortic stenosis. 6. Probably moderate mitral stenosis.  Transmitral gradient (12 mmHg) may be accentuated by mitral regurgitation and anemia.  Prominent v-waves also suggest a degree of mitral regurgitation. 7. Normal to supranormal Fick cardiac output/index.   Recommendations: 1. Anticipate restarting diuresis tomorrow, as renal function allows following contrast administration today. 2. Consider advanced heart failure consultation to assist with management of HFpEF and severe pulmonary hypertension.  I do not believe that valvular disease is the primary cause of the patient's symptoms. 3. Primary prevention of coronary artery disease. RA (mean): 14 mmHg RV (S/EDP): 110/15 mmHg PA (S/D, mean): 105/40 (62) mmHg PCWP (mean): 25 mmHg Ao sat: 91% PA sat: 53 Fick CO: 6.8 L/min Fick CI: 4.3 L/min/m^2 PVR: 5.4 Wood units   RHC 10/20 RA = 8 RV = 53/3 PA = 57/12 (32) PCW = 14 (v waves to 32) Fick cardiac output/index = 8.4/4.5 PVR = 2.1 WU Ao sat = 99% PA sat = 73%, 74% High SVC sat = 74%   Renal artery Dopplers 02/08/20: Right: Normal size right kidney. Abnormal right Resistive Index.         Normal cortical thickness of right kidney. Evidence of a         greater than 60% stenosis of the right renal artery (low end         of range based on peak velocity). RRV flow present.  Left:  Normal size of left kidney. Abnormal left Resisitve Index.         Normal cortical thickness of the left kidney. 1-59% stenosis         of the left renal artery. LRV flow present.  Mesenteric:  70 to 99%  stenosis in the celiac artery and superior mesenteric artery.   Patient Profile     64 y.o. female with a hx of severe aortic stenosis, moderate mitral stenosis, pulmonary hypertension followed by Dr. Haroldine Laws, paroxysmal atrial fibrillation not on anticoagulation, hypertension, diabetes, myelodysplasitc syndrome, transfusion dependent anemia, and prior colon cancer s/p hemicolectomy who is being followed in consultation for the evaluation of CHF at the request of Dr. Lorin Mercy.  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 MS severe by gradient and moderate by PHT. AS severe by area and peak velocity. -strict I/O, daily weights -admission weight 102.7 kg, weight today 99.4 kg   -she feels that her breathing improved with diuresis. Now off O2  -with rising creatinine, stopped  spironolactone, unclear full benefit in her unique phenotype and have concerns about kidney function - stopped 40 mg IV BID Lasix and will attempt return to patient's home dose 40 mg PO daily - patient to ambulate more today to confirm the feels like baseline  -per prior outpatient notes, not felt to be a candidate for intervention on her AS or MS due to her comorbidities.  - agree with palliative care consultation  CKD Stage 4 Hypertension Renal artery stenosis -blood pressure has been variable this admission -Cr 3.05 today, was 3.21 on admission. Baseline runs 2.5-2.9, which has been CKD 4 with GFR 19-26. -continue amlodipine, carvedilol, clonidine, doxazosin, hydralazine -sopped aldactone as above - Monitor Cr and K closely  Paroxysmal atrial fibrillation Anemia, chronic myelodysplastic syndrome -continue amiodarone -not on anticoagulation due to chronic anemia/GI bleeding -anemia management per primary team, s/p transfusion   For questions or updates, please  contact [facility] Please consult [email or web address] for contact info under     Signed, Werner Lean, MD  06/09/2020, 9:32 AM

## 2020-06-09 NOTE — Progress Notes (Signed)
PROGRESS NOTE    Patricia Mata   BZJ:[id number]  DOB: April 29, 1956  DOA: 06/07/2020     2  PCP: [name], FNP  CC: SOB  Hospital Course: Patricia Mata is a 64 y.o. female with medical history significant of HTN; DM; remote colon cancer/current MDS with transfusion-dependent anemia; chronic combined CHF; obesity (BMI 37); severe AS; and stage IV CKD presenting with SOB.    She also had reported weight gain at home over the past month, approximately 20 pounds was suspected.  She has also had increased lower extremity edema and worsening shortness of breath. Her last blood transfusion was on 05/18/2020 and she also receives EPO injections. CXR in the ER showed vascular congestion concerning for CHF/volume overload. She was also hypoxic and placed on 2 L nasal cannula. Hgb was 7.4 and she was given 1 unit PRBC and lasix. Given her presentation with complex underlying issues including severe AS, cardiology was also consulted on admission.   Interval History:  No events overnight.  Resting in bed more comfortable and breathing more comfortable today.  No acute concerns.  Hopeful for possibly going home tomorrow if labs are improving.  Old records reviewed in assessment of this patient  ROS: Constitutional: negative for chills and fevers, Respiratory: positive for dyspnea on exertion, Cardiovascular: negative for chest pain and Gastrointestinal: negative for abdominal pain  Assessment & Plan: * Acute hypoxemic respiratory failure (HCC) - considered multifactorial: chf exac, anemia, severe AS, mod MS - continue O2 and wean as able - continue lasix  Acute on chronic diastolic CHF (congestive heart failure) (HCC) - worsening SOB, LE edema, and hypoxia on admission - echo repeat ordered on admission: EF 60-65%, Gr2DD, mod LVH, mod MS, severe AS - cardiology following as well - continue lasix  MDS (myelodysplastic syndrome) ([name]) Most recently seen by Dr. Alen Blew on  11/4 -Previously had MDS treated with 5-azacytidine -On Reblozyl q3 weeks for anemia, with recurrent transfusions as needed -On palliative treatments only -EDP has ordered 2 units PRBC on admission  -Heme + stool without report of obvious GI bleeding - given multiple issues as noted, patient not wanting to be extremely aggressive in general; PCM has been consulted as well   Acute renal failure superimposed on stage 4 chronic kidney disease ([place]) - baseline creatinine 2.5-2.9; admitted with creat 3.21 - continue lasix and monitor renal response - improved then worsened with IV lasix and aldactone added; now on PO lasix, and spironolactone has been discontinued; agree with this; her volume status is already greatly improved as well - BMP and Mg in am  Hypothyroidism - continue synthroid   Type 2 diabetes mellitus with hemoglobin A1c goal of less than 7.0% (HCC) - continue SSI and CBG monitoring   Atrial fibrillation, transient (HCC) -Continue amiodarone -Not an anticoagulation candidate due to underlying MDS and chronic anemia  Carotid artery disease ([place]) - carotid u/s ordered on admission - chronic R occlusion; L ICA was 40-59% in 05/2019, now is 60-79% -Given no aggressive intervention from a cardiac standpoint, not likely to be a good candidate for vascular; will have her follow up outpatient with vascular surgery to discuss any further management if patient would want to be more aggressive  - continue lipitor   Aortic stenosis, severe -Again seen on repeat echo this admission -Outpatient notes indicate that patient not a good candidate for intervention on aortic or mitral valve due to underlying comorbidities  Hyperlipidemia - continue lipitor   Essential hypertension Dr.  Wallace added Coreg on 11/8 -Continue home meds - Norvasc, Coreg, Catapres, Cardura, Hydralazine - prn IV hydralazine  Anemia due to chronic kidney disease -Baseline hemoglobin around 8 g/dL -Received  transfusion on admission   Antimicrobials: None  DVT prophylaxis: SCD Code Status: Full Family Communication: None present Disposition Plan: Status is: Inpatient  Remains inpatient appropriate because:IV treatments appropriate due to intensity of illness or inability to take PO and Inpatient level of care appropriate due to severity of illness   Dispo: The patient is from: Home              Anticipated d/c is to: Home              Anticipated d/c date is: 2 days              Patient currently is not medically stable to d/c.  Objective: Blood pressure (!) 142/34, pulse 68, temperature 98.7 F (37.1 C), temperature source Oral, resp. rate 20, height 5\' 4"  (1.626 m), weight 99.4 kg, SpO2 92 %.  Examination: General appearance: alert, cooperative and no distress Head: Normocephalic, without obvious abnormality, atraumatic Eyes: EOMI Lungs: Improved breath sounds notably the crackles Heart: regular rate and rhythm, S1, S2 normal and 3/6 HSM heard throughout precordium Abdomen: normal findings: bowel sounds normal and soft, non-tender Extremities: 1+ lower extremity edema Skin: mobility and turgor normal Neurologic: Grossly normal  Consultants:   Cardiology  Procedures:   None  Data Reviewed: I have personally reviewed following labs and imaging studies Results for orders placed or performed during the hospital encounter of 06/07/20 (from the past 24 hour(s))  Glucose, capillary     Status: Abnormal   Collection Time: 06/08/20  4:43 PM  Result Value Ref Range   Glucose-Capillary 162 (H) 70 - 99 mg/dL  Basic metabolic panel     Status: Abnormal   Collection Time: 06/09/20  3:00 AM  Result Value Ref Range   Sodium 139 135 - 145 mmol/L   Potassium 3.9 3.5 - 5.1 mmol/L   Chloride 104 98 - 111 mmol/L   CO2 20 (L) 22 - 32 mmol/L   Glucose, Bld 86 70 - 99 mg/dL   BUN 89 (H) 8 - 23 mg/dL   Creatinine, Ser 3.35 (H) 0.44 - 1.00 mg/dL   Calcium 9.7 8.9 - 10.3 mg/dL   GFR,  Estimated 15 (L) >60 mL/min   Anion gap 15 5 - 15  CBC with Differential/Platelet     Status: Abnormal   Collection Time: 06/09/20  3:00 AM  Result Value Ref Range   WBC 6.5 4.0 - 10.5 K/uL   RBC 2.84 (L) 3.87 - 5.11 MIL/uL   Hemoglobin 8.7 (L) 12.0 - 15.0 g/dL   HCT 25.6 (L) 36 - 46 %   MCV 90.1 80.0 - 100.0 fL   MCH 30.6 26.0 - 34.0 pg   MCHC 34.0 30.0 - 36.0 g/dL   RDW 20.6 (H) 11.5 - 15.5 %   Platelets 162 150 - 400 K/uL   nRBC 1.5 (H) 0.0 - 0.2 %   Neutrophils Relative % 73 %   Neutro Abs 4.8 1.7 - 7.7 K/uL   Lymphocytes Relative 6 %   Lymphs Abs 0.4 (L) 0.7 - 4.0 K/uL   Monocytes Relative 19 %   Monocytes Absolute 1.2 (H) 0.1 - 1.0 K/uL   Eosinophils Relative 1 %   Eosinophils Absolute 0.1 0.0 - 0.5 K/uL   Basophils Relative 0 %   Basophils Absolute 0.0  0.0 - 0.1 K/uL   Immature Granulocytes 1 %   Abs Immature Granulocytes 0.05 0.00 - 0.07 K/uL  Magnesium     Status: None   Collection Time: 06/09/20  3:00 AM  Result Value Ref Range   Magnesium 2.0 1.7 - 2.4 mg/dL  Glucose, capillary     Status: None   Collection Time: 06/09/20  6:39 AM  Result Value Ref Range   Glucose-Capillary 82 70 - 99 mg/dL  Glucose, capillary     Status: Abnormal   Collection Time: 06/09/20 11:19 AM  Result Value Ref Range   Glucose-Capillary 160 (H) 70 - 99 mg/dL   *Note: Due to a large number of results and/or encounters for the requested time period, some results have not been displayed. A complete set of results can be found in Results Review.    Recent Results (from the past 240 hour(s))  Respiratory Panel by RT PCR (Flu A&B, Covid) - Nasopharyngeal Swab     Status: None   Collection Time: 06/07/20  8:26 AM   Specimen: Nasopharyngeal Swab; Nasopharyngeal(NP) swabs in vial transport medium  Result Value Ref Range Status   SARS Coronavirus 2 by RT PCR NEGATIVE NEGATIVE Final    Comment: (NOTE) SARS-CoV-2 target nucleic acids are NOT DETECTED.  The SARS-CoV-2 RNA is generally  detectable in upper respiratoy specimens during the acute phase of infection. The lowest concentration of SARS-CoV-2 viral copies this assay can detect is 131 copies/mL. A negative result does not preclude SARS-Cov-2 infection and should not be used as the sole basis for treatment or other patient management decisions. A negative result may occur with  improper specimen collection/handling, submission of specimen other than nasopharyngeal swab, presence of viral mutation(s) within the areas targeted by this assay, and inadequate number of viral copies (<131 copies/mL). A negative result must be combined with clinical observations, patient history, and epidemiological information. The expected result is Negative.  Fact Sheet for Patients:  PinkCheek.be  Fact Sheet for Healthcare Providers:  GravelBags.it  This test is no t yet approved or cleared by the [place] FDA and  has been authorized for detection and/or diagnosis of SARS-CoV-2 by FDA under an Emergency Use Authorization (EUA). This EUA will remain  in effect (meaning this test can be used) for the duration of the COVID-19 declaration under Section 564(b)(1) of the Act, 21 U.S.C. section 360bbb-3(b)(1), unless the authorization is terminated or revoked sooner.     Influenza A by PCR NEGATIVE NEGATIVE Final   Influenza B by PCR NEGATIVE NEGATIVE Final    Comment: (NOTE) The Xpert Xpress SARS-CoV-2/FLU/RSV assay is intended as an aid in  the diagnosis of influenza from Nasopharyngeal swab specimens and  should not be used as a sole basis for treatment. Nasal washings and  aspirates are unacceptable for Xpert Xpress SARS-CoV-2/FLU/RSV  testing.  Fact Sheet for Patients: PinkCheek.be  Fact Sheet for Healthcare Providers: GravelBags.it  This test is not yet approved or cleared by the [place] FDA and    has been authorized for detection and/or diagnosis of SARS-CoV-2 by  FDA under an Emergency Use Authorization (EUA). This EUA will remain  in effect (meaning this test can be used) for the duration of the  Covid-19 declaration under Section 564(b)(1) of the Act, 21  U.S.C. section 360bbb-3(b)(1), unless the authorization is  terminated or revoked. Performed at [facility], [place] [address], [place], [place] [address]      Radiology Studies: ECHOCARDIOGRAM COMPLETE  Result  Date: 06/07/2020    ECHOCARDIOGRAM REPORT   Patient Name:   LARIN DEPAOLI Prestridge Date of Exam: 06/07/2020 Medical Rec #:  [id number]            Height:       64.0 in Accession #:    [id number]           Weight:       226.4 lb Date of Birth:  [birth year]             BSA:          2.062 m Patient Age:    89 years             BP:           173/37 mmHg Patient Gender: F                    HR:           67 bpm. Exam Location:  Inpatient Procedure: 2D Echo Indications:    CHF  History:        Patient has prior history of Echocardiogram examinations, most                 recent 09/21/2019. Aortic Valve Disease; Risk Factors:Hypertension                 and Diabetes.  Sonographer:    Mikki Santee RDCS (AE) Referring Phys: [place]  1. Left ventricular ejection fraction, by estimation, is 60 to 65%. The left ventricle has normal function. The left ventricle has no regional wall motion abnormalities. There is moderate concentric left ventricular hypertrophy. Left ventricular diastolic parameters are consistent with Grade II diastolic dysfunction (pseudonormalization). Elevated left ventricular end-diastolic pressure.  2. Right ventricular systolic function is normal. The right ventricular size is normal. There is moderately elevated pulmonary artery systolic pressure. The estimated right ventricular systolic pressure is 10.2 mmHg.  3. Left atrial size was moderately dilated.  4. Right atrial size was severely  dilated.  5. 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 The mean mitral valve gradient is 10.0 mmHg. Severe mitral annular calcification.  6. Tricuspid valve regurgitation is mild to moderate.  7. The aortic valve is calcified. Aortic valve regurgitation is trivial. Severe aortic valve stenosis. Aortic valve area, by VTI measures 0.84 cm. Aortic valve Vmax measures 4.07 m/s.  8. The inferior vena cava is dilated in size with <50% respiratory variability, suggesting right atrial pressure of 15 mmHg. Comparison(s): No significant change from prior study. FINDINGS  Left Ventricle: Left ventricular ejection fraction, by estimation, is 60 to 65%. The left ventricle has normal function. The left ventricle has no regional wall motion abnormalities. The left ventricular internal cavity size was normal in size. There is  moderate concentric left ventricular hypertrophy. Left ventricular diastolic parameters are consistent with Grade II diastolic dysfunction (pseudonormalization). Elevated left ventricular end-diastolic pressure. Right Ventricle: The right ventricular size is normal. Right vetricular wall thickness was not well visualized. Right ventricular systolic function is normal. There is moderately elevated pulmonary artery systolic pressure. The tricuspid regurgitant velocity is 3.14 m/s, and with an assumed right atrial pressure of 15 mmHg, the estimated right ventricular systolic pressure is 58.5 mmHg. Left Atrium: Left atrial size was moderately dilated. Right Atrium: Right atrial size was severely dilated. Pericardium: Trivial pericardial effusion is present. Mitral Valve: Mean gradient is 10 mmHg at 64 bpm, but valve area is 2.50  cm2 by PHT. The mitral valve is rheumatic. There is moderate thickening of the mitral valve leaflet(s). There is moderate calcification of the mitral valve leaflet(s). Severe mitral  annular calcification. Mild mitral valve regurgitation.  Moderate mitral valve stenosis. MV peak gradient, 32.5 mmHg. The mean mitral valve gradient is 10.0 mmHg. Tricuspid Valve: The tricuspid valve is normal in structure. Tricuspid valve regurgitation is mild to moderate. No evidence of tricuspid stenosis. Aortic Valve: The aortic valve is calcified. Aortic valve regurgitation is trivial. Aortic regurgitation PHT measures 448 msec. Severe aortic stenosis is present. Aortic valve mean gradient measures 35.0 mmHg. Aortic valve peak gradient measures 66.3 mmHg. Aortic valve area, by VTI measures 0.84 cm. Pulmonic Valve: The pulmonic valve was not well visualized. Pulmonic valve regurgitation is trivial. No evidence of pulmonic stenosis. Aorta: The aortic root and ascending aorta are structurally normal, with no evidence of dilitation. Venous: The inferior vena cava is dilated in size with less than 50% respiratory variability, suggesting right atrial pressure of 15 mmHg. IAS/Shunts: The atrial septum is grossly normal.  LEFT VENTRICLE PLAX 2D LVIDd:         4.40 cm  Diastology LVIDs:         2.80 cm  LV e' medial:    6.22 cm/s LV PW:         1.40 cm  LV E/e' medial:  30.7 LV IVS:        1.30 cm  LV e' lateral:   8.59 cm/s LVOT diam:     2.00 cm  LV E/e' lateral: 22.2 LV SV:         89 LV SV Index:   43 LVOT Area:     3.14 cm  RIGHT VENTRICLE TAPSE (M-mode): 1.9 cm LEFT ATRIUM             Index       RIGHT ATRIUM           Index LA diam:        5.00 cm 2.42 cm/m  RA Area:     29.70 cm LA Vol (A2C):   83.6 ml 40.54 ml/m RA Volume:   100.00 ml 48.49 ml/m LA Vol (A4C):   72.3 ml 35.06 ml/m LA Biplane Vol: 79.0 ml 38.31 ml/m  AORTIC VALVE AV Area (Vmax):    0.71 cm AV Area (Vmean):   0.71 cm AV Area (VTI):     0.84 cm AV Vmax:           407.00 cm/s AV Vmean:          280.000 cm/s AV VTI:            1.050 m AV Peak Grad:      66.3 mmHg AV Mean Grad:      35.0 mmHg LVOT Vmax:         91.40 cm/s LVOT Vmean:        62.900 cm/s LVOT VTI:          0.282 m LVOT/AV VTI  ratio: 0.27 AI PHT:            448 msec  AORTA Ao Root diam: 3.20 cm MITRAL VALVE                TRICUSPID VALVE MV Area (PHT): 2.76 cm     TR Peak grad:   39.4 mmHg MV Peak grad:  32.5 mmHg    TR Vmax:        314.00 cm/s MV Mean  grad:  10.0 mmHg MV Vmax:       2.85 m/s     SHUNTS MV Vmean:      140.0 cm/s   Systemic VTI:  0.28 m MV Decel Time: 275 msec     Systemic Diam: 2.00 cm MV E velocity: 191.00 cm/s MV A velocity: 107.00 cm/s MV E/A ratio:  1.79 Buford Dresser MD Electronically signed by Buford Dresser MD Signature Date/Time: 06/07/2020/7:40:21 PM    Final    VAS US CAROTID  Result Date: 06/08/2020 Carotid Arterial Duplex Study Indications:       History of carotid artery stenosis. Risk Factors:      Hypertension, Diabetes. 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 Limitations        Today's exam was limited due to the high bifurcation of the                    carotid, the body habitus of the patient, heavy calcification                    and the resulting shadowing and the patient's respiratory                    variation. Comparison Study:  06/03/2019 Carotid artery duplex- occluded right ICA, left                    ICA was noted to have 40-59% stenosis, however this was                    diagnosed at the mid ICA for which criteria does not apply.                    Proximal left ICA velocity was documented at 180/35 cm/s,                    consistent with upper range 1-39% stenosis. Performing Technologist: Maudry Mayhew MHA, RDMS, RVT, RDCS  Examination Guidelines: A complete evaluation includes B-mode imaging, spectral Doppler, color Doppler, and power Doppler as needed of all accessible portions of each vessel. Bilateral testing is considered an integral part of a complete examination. Limited examinations for reoccurring indications may be performed as noted.  Right Carotid Findings:  +----------+--------+--------+--------+---------------------+------------------+           PSV cm/sEDV cm/sStenosisPlaque Description   Comments           +----------+--------+--------+--------+---------------------+------------------+ CCA Prox  33                      smooth and                                                                heterogenous                            +----------+--------+--------+--------+---------------------+------------------+ CCA Distal15                      smooth and  heterogenous                            +----------+--------+--------+--------+---------------------+------------------+ ICA Prox  17                                           thumped waveform   +----------+--------+--------+--------+---------------------+------------------+ ICA Distal12                                           thumped waveform   +----------+--------+--------+--------+---------------------+------------------+ ECA       12                      heterogenous and     Thumped waveform                                     irregular            with diastolic                                                            reversal           +----------+--------+--------+--------+---------------------+------------------+ +----------+--------+-------+---------+-------------------+           PSV cm/sEDV cmsDescribe Arm Pressure (mmHG) +----------+--------+-------+---------+-------------------+ Subclavian272            Turbulent                    +----------+--------+-------+---------+-------------------+ +---------+--------+---+--------+--+---------+ VertebralPSV cm/s127EDV cm/s17Antegrade +---------+--------+---+--------+--+---------+  Left Carotid Findings: +----------+-------+-------+--------+---------------------------------+--------+           PSV    EDV     StenosisPlaque Description               Comments           cm/s   cm/s                                                     +----------+-------+-------+--------+---------------------------------+--------+ CCA Prox  213    33             smooth and heterogenous                   +----------+-------+-------+--------+---------------------------------+--------+ CCA Distal154    31             heterogenous, irregular and                                               calcific                                  +----------+-------+-------+--------+---------------------------------+--------+ ICA Prox  243  33             heterogenous and calcific                 +----------+-------+-------+--------+---------------------------------+--------+ ICA Mid   279    37                                                       +----------+-------+-------+--------+---------------------------------+--------+ ICA Distal209    33                                                       +----------+-------+-------+--------+---------------------------------+--------+ ECA       375    29     >50%    heterogenous and calcific                 +----------+-------+-------+--------+---------------------------------+--------+ +----------+--------+--------+----------------+-------------------+           PSV cm/sEDV cm/sDescribe        Arm Pressure (mmHG) +----------+--------+--------+----------------+-------------------+ [id number]             Multiphasic, WNL                    +----------+--------+--------+----------------+-------------------+ +---------+--------+---+--------+--+---------+ VertebralPSV cm/s146EDV cm/s26Antegrade +---------+--------+---+--------+--+---------+   Summary: Right Carotid: Previous carotid artery duplex noted occlusion of right ICA,                however a dampened waveform was detected by pulsed wave Doppler                in the proximal  and distal ICA, suggestive of likely near                occlusion with possible distal occlusion. Left Carotid: Peak systolic ICA velocities suggestive of 60-79% stenosis,               however end diastolic velocities suggest upper range 1-39%               stenosis. Increased perfusion may be secondary to compensatory               flow, considering right ICA disease. Vertebrals:  Bilateral vertebral arteries demonstrate antegrade flow. Subclavians: Right subclavian artery flow was disturbed. Normal flow              hemodynamics were seen in the left subclavian artery. *See table(s) above for measurements and observations.  Electronically signed by Antony Contras MD on 06/08/2020 at 11:43:24 AM.    Final    VAS US CAROTID  Final Result    DG [facility] 1 View  Final Result      Scheduled Meds: . (feeding supplement) PROSource Plus  30 mL Oral BID BM  . allopurinol  300 mg Oral Daily  . amiodarone  200 mg Oral Daily  . amLODipine  10 mg Oral Daily  . atorvastatin  40 mg Oral Daily  . carvedilol  12.5 mg Oral BID WC  . cloNIDine  0.1 mg Oral BID  . docusate sodium  100 mg Oral BID  . doxazosin  8 mg Oral Daily  . furosemide  40 mg Oral Daily  . hydrALAZINE  100 mg  Oral TID  . insulin aspart  0-9 Units Subcutaneous TID WC  . insulin glargine  18 Units Subcutaneous Daily  . latanoprost  1 drop Both Eyes QHS  . levothyroxine  150 mcg Oral Daily  . multivitamin with minerals  1 tablet Oral Daily  . sildenafil  20 mg Oral TID  . sodium chloride flush  3 mL Intravenous Q12H   PRN Meds: acetaminophen **OR** acetaminophen, bisacodyl, calcium carbonate (dosed in mg elemental calcium), camphor-menthol **AND** hydrOXYzine, docusate sodium, feeding supplement (NEPRO CARB STEADY), hydrALAZINE, morphine injection, ondansetron **OR** ondansetron (ZOFRAN) IV, oxyCODONE, polyethylene glycol, sorbitol, zolpidem Continuous Infusions:   LOS: 2 days  Time spent: Greater than 50% of the 35 minute visit  was spent in counseling/coordination of care for the patient as laid out in the A&P.   Dwyane Dee, MD [facility] 06/09/2020, 2:48 PM

## 2020-06-10 ENCOUNTER — Inpatient Hospital Stay (HOSPITAL_COMMUNITY): Payer: Medicare Other

## 2020-06-10 DIAGNOSIS — J9601 Acute respiratory failure with hypoxia: Secondary | ICD-10-CM | POA: Diagnosis not present

## 2020-06-10 DIAGNOSIS — I350 Nonrheumatic aortic (valve) stenosis: Secondary | ICD-10-CM | POA: Diagnosis not present

## 2020-06-10 DIAGNOSIS — I5033 Acute on chronic diastolic (congestive) heart failure: Secondary | ICD-10-CM | POA: Diagnosis not present

## 2020-06-10 LAB — CBC WITH DIFFERENTIAL/PLATELET
Abs Immature Granulocytes: 0.07 10*3/uL (ref 0.00–0.07)
Basophils Absolute: 0 10*3/uL (ref 0.0–0.1)
Basophils Relative: 0 %
Eosinophils Absolute: 0.1 10*3/uL (ref 0.0–0.5)
Eosinophils Relative: 1 %
HCT: 25.9 % — ABNORMAL LOW (ref 36.0–46.0)
Hemoglobin: 8.7 g/dL — ABNORMAL LOW (ref 12.0–15.0)
Immature Granulocytes: 1 %
Lymphocytes Relative: 8 %
Lymphs Abs: 0.5 10*3/uL — ABNORMAL LOW (ref 0.7–4.0)
MCH: 30.7 pg (ref 26.0–34.0)
MCHC: 33.6 g/dL (ref 30.0–36.0)
MCV: 91.5 fL (ref 80.0–100.0)
Monocytes Absolute: 1.1 10*3/uL — ABNORMAL HIGH (ref 0.1–1.0)
Monocytes Relative: 18 %
Neutro Abs: 4.6 10*3/uL (ref 1.7–7.7)
Neutrophils Relative %: 72 %
Platelets: 158 10*3/uL (ref 150–400)
RBC: 2.83 MIL/uL — ABNORMAL LOW (ref 3.87–5.11)
RDW: 21 % — ABNORMAL HIGH (ref 11.5–15.5)
WBC: 6.4 10*3/uL (ref 4.0–10.5)
nRBC: 1.6 % — ABNORMAL HIGH (ref 0.0–0.2)

## 2020-06-10 LAB — BASIC METABOLIC PANEL
Anion gap: 12 (ref 5–15)
BUN: 96 mg/dL — ABNORMAL HIGH (ref 8–23)
CO2: 22 mmol/L (ref 22–32)
Calcium: 9.2 mg/dL (ref 8.9–10.3)
Chloride: 101 mmol/L (ref 98–111)
Creatinine, Ser: 3.41 mg/dL — ABNORMAL HIGH (ref 0.44–1.00)
GFR, Estimated: 14 mL/min — ABNORMAL LOW (ref >60)
Glucose, Bld: 164 mg/dL — ABNORMAL HIGH (ref 70–99)
Potassium: 4 mmol/L (ref 3.5–5.1)
Sodium: 135 mmol/L (ref 135–145)

## 2020-06-10 LAB — GLUCOSE, CAPILLARY
Glucose-Capillary: 55 mg/dL — ABNORMAL LOW (ref 70–99)
Glucose-Capillary: 86 mg/dL (ref 70–99)
Glucose-Capillary: 179 mg/dL — ABNORMAL HIGH (ref 70–99)
Glucose-Capillary: 161 mg/dL — ABNORMAL HIGH (ref 70–99)

## 2020-06-10 LAB — MAGNESIUM: Magnesium: 2.1 mg/dL (ref 1.7–2.4)

## 2020-06-10 MED ORDER — ALLOPURINOL 100 MG PO TABS
100.0000 mg | ORAL_TABLET | Freq: Every day | ORAL | Status: DC
  Administered 2020-06-11: 100 mg via ORAL
  Filled 2020-06-10: qty 1

## 2020-06-10 NOTE — Progress Notes (Signed)
PROGRESS NOTE    Patricia Mata   UMP:[id number]  DOB: 1956-02-28  DOA: 06/07/2020     3  PCP: [name], FNP  CC: SOB  Hospital Course: Patricia Mata is a 64 y.o. female with medical history significant of HTN; DM; remote colon cancer/current MDS with transfusion-dependent anemia; chronic combined CHF; obesity (BMI 37); severe AS; and stage IV CKD presenting with SOB.    She also had reported weight gain at home over the past month, approximately 20 pounds was suspected.  She has also had increased lower extremity edema and worsening shortness of breath. Her last blood transfusion was on 05/18/2020 and she also receives EPO injections. CXR in the ER showed vascular congestion concerning for CHF/volume overload. She was also hypoxic and placed on 2 L nasal cannula. Hgb was 7.4 and she was given 1 unit PRBC and lasix. Given her presentation with complex underlying issues including severe AS, cardiology was also consulted on admission.   Interval History:  No events overnight.  States that her breathing has improved significantly since admission.  Also has been voiding well.  We discussed slight worsening of renal function and that we would obtain input from nephrology today.  Old records reviewed in assessment of this patient  ROS: Constitutional: negative for chills and fevers, Respiratory: positive for dyspnea on exertion, Cardiovascular: negative for chest pain and Gastrointestinal: negative for abdominal pain  Assessment & Plan: * Acute hypoxemic respiratory failure (HCC)-resolved as of 06/10/2020 - considered multifactorial: chf exac, anemia, severe AS, mod MS - continue O2 and wean as able; weaned to room air - continue lasix  Acute on chronic diastolic CHF (congestive heart failure) (HCC) - worsening SOB, LE edema, and hypoxia on admission - echo repeat ordered on admission: EF 60-65%, Gr2DD, mod LVH, mod MS, severe AS - cardiology following as well -  continue lasix; changed to p.o. due to slightly worsening renal function.   -Spironolactone also discontinued due to to worsening renal function  MDS (myelodysplastic syndrome) ([place]) Most recently seen by Dr. Alen Blew on 11/4 -Previously had MDS treated with 5-azacytidine -On Reblozyl q3 weeks for anemia, with recurrent transfusions as needed -On palliative treatments only -EDP has ordered 2 units PRBC on admission  -Heme + stool without report of obvious GI bleeding - given multiple issues as noted, patient not wanting to be extremely aggressive in general; PCM has been consulted as well   Acute renal failure superimposed on stage 4 chronic kidney disease ([place]) - baseline creatinine 2.5-2.9; admitted with creat 3.21 - continue lasix and monitor renal response - improved then worsened with IV lasix and aldactone added; now on PO lasix, and spironolactone has been discontinued; agree with this; her volume status is already greatly improved as well - BMP and Mg in am -Still some worsening with renal function today.  Reaching out to nephrology for further input, greatly appreciate assistance.  For now, will monitor renal function to see if stabilizes; further plan to be determined from there -She has known bilateral renal artery stenosis, right greater than left.  Hypothyroidism - continue synthroid   Type 2 diabetes mellitus with hemoglobin A1c goal of less than 7.0% (HCC) - continue SSI and CBG monitoring   Atrial fibrillation, transient (HCC) -Continue amiodarone -Not an anticoagulation candidate due to underlying MDS and chronic anemia  Carotid artery disease ([place]) - carotid u/s ordered on admission - chronic R occlusion; L ICA was 40-59% in 05/2019, now is 60-79% -Given no aggressive intervention  from a cardiac standpoint, not likely to be a good candidate for vascular; will have her follow up outpatient with vascular surgery to discuss any further management if patient would want to  be more aggressive  - continue lipitor   Aortic stenosis, severe -Again seen on repeat echo this admission -Outpatient notes indicate that patient not a good candidate for intervention on aortic or mitral valve due to underlying comorbidities  Hyperlipidemia - continue lipitor   Essential hypertension Dr. Oval Linsey added Coreg on 11/8 -Continue home meds - Norvasc, Coreg, Catapres, Cardura, Hydralazine - nephrology recommendations reviewed as well; resistant HTN and volume challenges may benefit from RAS intervention - prn IV hydralazine  Anemia due to chronic kidney disease -Baseline hemoglobin around 8 g/dL -Received transfusion on admission   Antimicrobials: None  DVT prophylaxis: SCD Code Status: Full Family Communication: None present Disposition Plan: Status is: Inpatient  Remains inpatient appropriate because:IV treatments appropriate due to intensity of illness or inability to take PO and Inpatient level of care appropriate due to severity of illness   Dispo: The patient is from: Home              Anticipated d/c is to: Home              Anticipated d/c date is: 2 days              Patient currently is not medically stable to d/c.  Objective: Blood pressure (!) 144/46, pulse 66, temperature 98.7 F (37.1 C), temperature source Oral, resp. rate 19, height 5\' 4"  (1.626 m), weight 99.5 kg, SpO2 94 %.  Examination: General appearance: alert, cooperative and no distress Head: Normocephalic, without obvious abnormality, atraumatic Eyes: EOMI Lungs: Improved breath sounds notably the crackles Heart: regular rate and rhythm, S1, S2 normal and 3/6 HSM heard throughout precordium Abdomen: normal findings: bowel sounds normal and soft, non-tender Extremities: 1+ lower extremity edema Skin: mobility and turgor normal Neurologic: Grossly normal  Consultants:   Cardiology  Procedures:   None  Data Reviewed: I have personally reviewed following labs and imaging  studies Results for orders placed or performed during the hospital encounter of 06/07/20 (from the past 24 hour(s))  Glucose, capillary     Status: Abnormal   Collection Time: 06/09/20  4:27 PM  Result Value Ref Range   Glucose-Capillary 123 (H) 70 - 99 mg/dL  Basic metabolic panel     Status: Abnormal   Collection Time: 06/10/20  4:34 AM  Result Value Ref Range   Sodium 135 135 - 145 mmol/L   Potassium 4.0 3.5 - 5.1 mmol/L   Chloride 101 98 - 111 mmol/L   CO2 22 22 - 32 mmol/L   Glucose, Bld 164 (H) 70 - 99 mg/dL   BUN 96 (H) 8 - 23 mg/dL   Creatinine, Ser 3.41 (H) 0.44 - 1.00 mg/dL   Calcium 9.2 8.9 - 10.3 mg/dL   GFR, Estimated 14 (L) >60 mL/min   Anion gap 12 5 - 15  CBC with Differential/Platelet     Status: Abnormal   Collection Time: 06/10/20  4:34 AM  Result Value Ref Range   WBC 6.4 4.0 - 10.5 K/uL   RBC 2.83 (L) 3.87 - 5.11 MIL/uL   Hemoglobin 8.7 (L) 12.0 - 15.0 g/dL   HCT 25.9 (L) 36 - 46 %   MCV 91.5 80.0 - 100.0 fL   MCH 30.7 26.0 - 34.0 pg   MCHC 33.6 30.0 - 36.0 g/dL   RDW  21.0 (H) 11.5 - 15.5 %   Platelets 158 150 - 400 K/uL   nRBC 1.6 (H) 0.0 - 0.2 %   Neutrophils Relative % 72 %   Neutro Abs 4.6 1.7 - 7.7 K/uL   Lymphocytes Relative 8 %   Lymphs Abs 0.5 (L) 0.7 - 4.0 K/uL   Monocytes Relative 18 %   Monocytes Absolute 1.1 (H) 0.1 - 1.0 K/uL   Eosinophils Relative 1 %   Eosinophils Absolute 0.1 0.0 - 0.5 K/uL   Basophils Relative 0 %   Basophils Absolute 0.0 0.0 - 0.1 K/uL   Immature Granulocytes 1 %   Abs Immature Granulocytes 0.07 0.00 - 0.07 K/uL  Magnesium     Status: None   Collection Time: 06/10/20  4:34 AM  Result Value Ref Range   Magnesium 2.1 1.7 - 2.4 mg/dL  Glucose, capillary     Status: Abnormal   Collection Time: 06/10/20 11:37 AM  Result Value Ref Range   Glucose-Capillary 55 (L) 70 - 99 mg/dL   Comment 1 Notify RN    Comment 2 Document in Chart   Glucose, capillary     Status: None   Collection Time: 06/10/20 12:22 PM  Result  Value Ref Range   Glucose-Capillary 86 70 - 99 mg/dL   *Note: Due to a large number of results and/or encounters for the requested time period, some results have not been displayed. A complete set of results can be found in Results Review.    Recent Results (from the past 240 hour(s))  Respiratory Panel by RT PCR (Flu A&B, Covid) - Nasopharyngeal Swab     Status: None   Collection Time: 06/07/20  8:26 AM   Specimen: Nasopharyngeal Swab; Nasopharyngeal(NP) swabs in vial transport medium  Result Value Ref Range Status   SARS Coronavirus 2 by RT PCR NEGATIVE NEGATIVE Final    Comment: (NOTE) SARS-CoV-2 target nucleic acids are NOT DETECTED.  The SARS-CoV-2 RNA is generally detectable in upper respiratoy specimens during the acute phase of infection. The lowest concentration of SARS-CoV-2 viral copies this assay can detect is 131 copies/mL. A negative result does not preclude SARS-Cov-2 infection and should not be used as the sole basis for treatment or other patient management decisions. A negative result may occur with  improper specimen collection/handling, submission of specimen other than nasopharyngeal swab, presence of viral mutation(s) within the areas targeted by this assay, and inadequate number of viral copies (<131 copies/mL). A negative result must be combined with clinical observations, patient history, and epidemiological information. The expected result is Negative.  Fact Sheet for Patients:  PinkCheek.be  Fact Sheet for Healthcare Providers:  GravelBags.it  This test is no t yet approved or cleared by the [place] FDA and  has been authorized for detection and/or diagnosis of SARS-CoV-2 by FDA under an Emergency Use Authorization (EUA). This EUA will remain  in effect (meaning this test can be used) for the duration of the COVID-19 declaration under Section 564(b)(1) of the Act, 21 U.S.C. section  360bbb-3(b)(1), unless the authorization is terminated or revoked sooner.     Influenza A by PCR NEGATIVE NEGATIVE Final   Influenza B by PCR NEGATIVE NEGATIVE Final    Comment: (NOTE) The Xpert Xpress SARS-CoV-2/FLU/RSV assay is intended as an aid in  the diagnosis of influenza from Nasopharyngeal swab specimens and  should not be used as a sole basis for treatment. Nasal washings and  aspirates are unacceptable for Xpert Xpress SARS-CoV-2/FLU/RSV  testing.  Fact Sheet for Patients: PinkCheek.be  Fact Sheet for Healthcare Providers: GravelBags.it  This test is not yet approved or cleared by the [place] FDA and  has been authorized for detection and/or diagnosis of SARS-CoV-2 by  FDA under an Emergency Use Authorization (EUA). This EUA will remain  in effect (meaning this test can be used) for the duration of the  Covid-19 declaration under Section 564(b)(1) of the Act, 21  U.S.C. section 360bbb-3(b)(1), unless the authorization is  terminated or revoked. Performed at [facility], [place] [address], [place], [place] [address]      Radiology Studies: No results found. VAS US CAROTID  Final Result    DG Chest Port 1 View  Final Result    US RENAL    (Results Pending)    Scheduled Meds: . (feeding supplement) PROSource Plus  30 mL Oral BID BM  . [START ON 06/11/2020] allopurinol  100 mg Oral Daily  . amiodarone  200 mg Oral Daily  . amLODipine  10 mg Oral Daily  . atorvastatin  40 mg Oral Daily  . carvedilol  12.5 mg Oral BID WC  . cloNIDine  0.1 mg Oral BID  . docusate sodium  100 mg Oral BID  . doxazosin  8 mg Oral Daily  . furosemide  40 mg Oral Daily  . hydrALAZINE  100 mg Oral TID  . insulin aspart  0-9 Units Subcutaneous TID WC  . insulin glargine  18 Units Subcutaneous Daily  . latanoprost  1 drop Both Eyes QHS  . levothyroxine  150 mcg Oral Daily  . multivitamin with minerals  1 tablet  Oral Daily  . sildenafil  20 mg Oral TID  . sodium chloride flush  3 mL Intravenous Q12H   PRN Meds: acetaminophen **OR** acetaminophen, bisacodyl, calcium carbonate (dosed in mg elemental calcium), camphor-menthol **AND** hydrOXYzine, docusate sodium, feeding supplement (NEPRO CARB STEADY), hydrALAZINE, morphine injection, ondansetron **OR** ondansetron (ZOFRAN) IV, oxyCODONE, polyethylene glycol, sorbitol, zolpidem Continuous Infusions:   LOS: 3 days  Time spent: Greater than 50% of the 35 minute visit was spent in counseling/coordination of care for the patient as laid out in the A&P.   Dwyane Dee, MD [facility] 06/10/2020, 3:50 PM

## 2020-06-10 NOTE — Progress Notes (Signed)
Progress Note  Patient Name: Patricia Mata Date of Encounter: 06/10/2020  [facility] Cardiologist: Quay Burow, MD   Subjective   Minimal recorded diuresis overnight. Palliative care consultation recommended for AS/MS. Weight stable on oral lasix - says she feels at baseline.  Inpatient Medications    Scheduled Meds: . (feeding supplement) PROSource Plus  30 mL Oral BID BM  . allopurinol  300 mg Oral Daily  . amiodarone  200 mg Oral Daily  . amLODipine  10 mg Oral Daily  . atorvastatin  40 mg Oral Daily  . carvedilol  12.5 mg Oral BID WC  . cloNIDine  0.1 mg Oral BID  . docusate sodium  100 mg Oral BID  . doxazosin  8 mg Oral Daily  . furosemide  40 mg Oral Daily  . hydrALAZINE  100 mg Oral TID  . insulin aspart  0-9 Units Subcutaneous TID WC  . insulin glargine  18 Units Subcutaneous Daily  . latanoprost  1 drop Both Eyes QHS  . levothyroxine  150 mcg Oral Daily  . multivitamin with minerals  1 tablet Oral Daily  . sildenafil  20 mg Oral TID  . sodium chloride flush  3 mL Intravenous Q12H   Continuous Infusions:  PRN Meds: acetaminophen **OR** acetaminophen, bisacodyl, calcium carbonate (dosed in mg elemental calcium), camphor-menthol **AND** hydrOXYzine, docusate sodium, feeding supplement (NEPRO CARB STEADY), hydrALAZINE, morphine injection, ondansetron **OR** ondansetron (ZOFRAN) IV, oxyCODONE, polyethylene glycol, sorbitol, zolpidem   Vital Signs    Vitals:   06/09/20 2139 06/10/20 0108 06/10/20 0500 06/10/20 1000  BP: (!) 142/41  (!) 135/40 (!) 144/46  Pulse:   64 66  Resp:   19   Temp:   98.2 F (36.8 C) 98.7 F (37.1 C)  TempSrc:   Oral Oral  SpO2:   100% 94%  Weight:  99.5 kg    Height:        Intake/Output Summary (Last 24 hours) at 06/10/2020 1058 Last data filed at 06/09/2020 2009 Gross per 24 hour  Intake 720 ml  Output 850 ml  Net -130 ml   Last 3 Weights 06/10/2020 06/09/2020 06/08/2020  Weight (lbs) 219 lb 6.4 oz 219 lb  2.2 oz 222 lb 0.1 oz  Weight (kg) 99.519 kg 99.4 kg 100.7 kg      Telemetry    SR- Personally Reviewed  ECG    N/A  Physical Exam   GEN: No acute distress.   Neck: No JVD visible but difficult body habitus Cardiac: regular S1 and S2. Harsh mid peaking 3/6 systolic ejection murmur. Subtle I/IV diastolic rumble Respiratory: Clear to auscultation bilaterally. GI: Soft, nontender, non-distended  MS: No edema; No deformity. Neuro:  Nonfocal  Psych: Normal affect   Labs    High Sensitivity Troponin:  No results for input(s): TROPONINIHS in the last 720 hours.    Chemistry Recent Labs  Lab 06/07/20 0825 06/07/20 0825 06/08/20 0233 06/09/20 0300 06/10/20 0434  NA 133*   < > 137 139 135  K 4.0   < > 4.1 3.9 4.0  CL 102   < > 103 104 101  CO2 16*   < > 20* 20* 22  GLUCOSE 224*   < > 154* 86 164*  BUN 80*   < > 80* 89* 96*  CREATININE 3.21*   < > 3.05* 3.35* 3.41*  CALCIUM 9.1   < > 9.5 9.7 9.2  PROT 7.0  --   --   --   --  ALBUMIN 3.6  --   --   --   --   AST 31  --   --   --   --   ALT 19  --   --   --   --   ALKPHOS 75  --   --   --   --   BILITOT 1.0  --   --   --   --   GFRNONAA 15*   < > 16* 15* 14*  ANIONGAP 15   < > 14 15 12    < > = values in this interval not displayed.     Hematology Recent Labs  Lab 06/08/20 0233 06/09/20 0300 06/10/20 0434  WBC 8.1 6.5 6.4  RBC 3.01* 2.84* 2.83*  HGB 9.1* 8.7* 8.7*  HCT 26.9* 25.6* 25.9*  MCV 89.4 90.1 91.5  MCH 30.2 30.6 30.7  MCHC 33.8 34.0 33.6  RDW 20.1* 20.6* 21.0*  PLT 172 162 158    BNP Recent Labs  Lab 06/07/20 0826  BNP 489.2*     DDimer No results for input(s): DDIMER in the last 168 hours.   Radiology    VAS US CAROTID  Result Date: 06/08/2020 Carotid Arterial Duplex Study Indications:       History of carotid artery stenosis. Risk Factors:      Hypertension, Diabetes. Other Factors:     Severe aortic valve stenosis, rheumatic mitral valve with                    moderate mitral  stenosis. Limitations        Today's exam was limited due to the high bifurcation of the                    carotid, the body habitus of the patient, heavy calcification                    and the resulting shadowing and the patient's respiratory                    variation. Comparison Study:  06/03/2019 Carotid artery duplex- occluded right ICA, left                    ICA was noted to have 40-59% stenosis, however this was                    diagnosed at the mid ICA for which criteria does not apply.                    Proximal left ICA velocity was documented at 180/35 cm/s,                    consistent with upper range 1-39% stenosis. Performing Technologist: Maudry Mayhew MHA, RDMS, RVT, RDCS  Examination Guidelines: A complete evaluation includes B-mode imaging, spectral Doppler, color Doppler, and power Doppler as needed of all accessible portions of each vessel. Bilateral testing is considered an integral part of a complete examination. Limited examinations for reoccurring indications may be performed as noted.  Right Carotid Findings: +----------+--------+--------+--------+---------------------+------------------+           PSV cm/sEDV cm/sStenosisPlaque Description   Comments           +----------+--------+--------+--------+---------------------+------------------+ CCA Prox  33                      smooth and  heterogenous                            +----------+--------+--------+--------+---------------------+------------------+ CCA Distal15                      smooth and                                                                heterogenous                            +----------+--------+--------+--------+---------------------+------------------+ ICA Prox  17                                           thumped waveform    +----------+--------+--------+--------+---------------------+------------------+ ICA Distal12                                           thumped waveform   +----------+--------+--------+--------+---------------------+------------------+ ECA       12                      heterogenous and     Thumped waveform                                     irregular            with diastolic                                                            reversal           +----------+--------+--------+--------+---------------------+------------------+ +----------+--------+-------+---------+-------------------+           PSV cm/sEDV cmsDescribe Arm Pressure (mmHG) +----------+--------+-------+---------+-------------------+ Subclavian272            Turbulent                    +----------+--------+-------+---------+-------------------+ +---------+--------+---+--------+--+---------+ VertebralPSV cm/s127EDV cm/s17Antegrade +---------+--------+---+--------+--+---------+  Left Carotid Findings: +----------+-------+-------+--------+---------------------------------+--------+           PSV    EDV    StenosisPlaque Description               Comments           cm/s   cm/s                                                     +----------+-------+-------+--------+---------------------------------+--------+ CCA Prox  213    33             smooth and heterogenous                   +----------+-------+-------+--------+---------------------------------+--------+  CCA Distal154    31             heterogenous, irregular and                                               calcific                                  +----------+-------+-------+--------+---------------------------------+--------+ ICA Prox  243    33             heterogenous and calcific                 +----------+-------+-------+--------+---------------------------------+--------+ ICA Mid   279    37                                                        +----------+-------+-------+--------+---------------------------------+--------+ ICA Distal209    33                                                       +----------+-------+-------+--------+---------------------------------+--------+ ECA       375    29     >50%    heterogenous and calcific                 +----------+-------+-------+--------+---------------------------------+--------+ +----------+--------+--------+----------------+-------------------+           PSV cm/sEDV cm/sDescribe        Arm Pressure (mmHG) +----------+--------+--------+----------------+-------------------+ [id number]             Multiphasic, WNL                    +----------+--------+--------+----------------+-------------------+ +---------+--------+---+--------+--+---------+ VertebralPSV cm/s146EDV cm/s26Antegrade +---------+--------+---+--------+--+---------+   Summary: Right Carotid: Previous carotid artery duplex noted occlusion of right ICA,                however a dampened waveform was detected by pulsed wave Doppler                in the proximal and distal ICA, suggestive of likely near                occlusion with possible distal occlusion. Left Carotid: Peak systolic ICA velocities suggestive of 60-79% stenosis,               however end diastolic velocities suggest upper range 1-39%               stenosis. Increased perfusion may be secondary to compensatory               flow, considering right ICA disease. Vertebrals:  Bilateral vertebral arteries demonstrate antegrade flow. Subclavians: Right subclavian artery flow was disturbed. Normal flow              hemodynamics were seen in the left subclavian artery. *See table(s) above for measurements and observations.  Electronically signed by Antony Contras MD on 06/08/2020 at 11:43:24 AM.    Final     Cardiac Studies   IMPRESSIONS  1. Left ventricular ejection fraction, by  estimation, is 60 to 65%. The  left ventricle has normal function. The left ventricle has no regional  wall motion abnormalities. There is moderate concentric left ventricular  hypertrophy. Left ventricular  diastolic parameters are consistent with Grade II diastolic dysfunction  (pseudonormalization). Elevated left ventricular end-diastolic pressure.   2. Right ventricular systolic function is normal. The right ventricular  size is normal. There is moderately elevated pulmonary artery systolic  pressure. The estimated right ventricular systolic pressure is 17.5 mmHg.   3. Left atrial size was moderately dilated.   4. Right atrial size was severely dilated.   5. The mitral valve is rheumatic. Mild mitral valve regurgitation.  Moderate mitral stenosis. The mean mitral valve gradient is 10.0 mmHg.  Severe mitral annular calcification.   6. Tricuspid valve regurgitation is mild to moderate.   7. The aortic valve is calcified. Aortic valve regurgitation is trivial.  Severe aortic valve stenosis. Aortic valve area, by VTI measures 0.84 cm.  Aortic valve Vmax measures 4.07 m/s.   8. The inferior vena cava is dilated in size with <50% respiratory  variability, suggesting right atrial pressure of 15 mmHg.   Echo 05/12/18: - Left ventricle: The cavity size was normal. There was moderate    concentric hypertrophy. Systolic function was vigorous. The    estimated ejection fraction was in the range of 65% to 70%. Wall    motion was normal; there were no regional wall motion    abnormalities. Features are consistent with a pseudonormal left    ventricular filling pattern, with concomitant abnormal relaxation    and increased filling pressure (grade 2 diastolic dysfunction).    Doppler parameters are consistent with high ventricular filling    pressure.  - Aortic valve: Trileaflet; moderately thickened, moderately    calcified leaflets. Valve mobility was restricted. There was    moderate  stenosis. Mean gradient (S): 22 mm Hg. VTI ratio of LVOT    to aortic valve: 0.37. Valve area (VTI): 1.28 cm^2. Valve area    (Vmax): 1.01 cm^2. Valve area (Vmean): 1.18 cm^2.  - Mitral valve: Calcified annulus. Moderate diffuse thickening,    calcification, and elongation. The findings are consistent with    mild to moderate stenosis. Deceleration time: 324 ms.  - Tricuspid valve: There was moderate regurgitation.  - Pulmonic valve: There was trivial regurgitation.  - Pulmonary arteries: PA peak pressure: 93 mm Hg (S).   Echo 09/21/19:  1. Left ventricular ejection fraction, by estimation, is 65 to 70%. The  left ventricle has hyperdynamic function. The left ventricle has no  regional wall motion abnormalities. Left ventricular diastolic parameters  are consistent with Grade II diastolic  dysfunction (pseudonormalization). Elevated left atrial pressure.   2. Right ventricular systolic function is normal. The right ventricular  size is normal. There is moderately elevated pulmonary artery systolic  pressure.   3. Left atrial size was severely dilated.   4. The mitral valve is rheumatic. Mild to moderate mitral valve  regurgitation. Mild to moderate mitral stenosis. The mean mitral valve  gradient is 9.4 mmHg with average heart rate of 81 bpm.   5. The aortic valve has an indeterminant number of cusps. Aortic valve  regurgitation is trivial. Severe aortic valve stenosis. Aortic valve area,  by VTI measures 0.82 cm. Aortic valve mean gradient measures 42.0 mmHg.  Aortic valve Vmax measures 4.32  m/s.   6. The inferior vena cava is normal in size with greater  than 50%  respiratory variability, suggesting right atrial pressure of 3 mmHg.   7. Agitated saline contrast bubble study was negative, with no evidence  of any interatrial shunt.   Comparison(s): Prior images reviewed side by side. Changes from prior  study are noted. Increased mitral gradients are partly explained by  increased  heart rate and cardiac output.    LHC/RHC 05/21/18: Conclusions: 1. No angiographically significant coronary artery disease. 2. Severe pulmonary hypertension (mean PAP 62 mmHg; PVR 5.4 WU). 3. Mildly elevated left heart filling pressure. 4. Moderately elevated right heart filling pressure.  Right atrial waveform has an "M" or "W" appearance that suggests restrictive/contrictive physiology. 5. Moderate aortic stenosis. 6. Probably moderate mitral stenosis.  Transmitral gradient (12 mmHg) may be accentuated by mitral regurgitation and anemia.  Prominent v-waves also suggest a degree of mitral regurgitation. 7. Normal to supranormal Fick cardiac output/index.   Recommendations: 1. Anticipate restarting diuresis tomorrow, as renal function allows following contrast administration today. 2. Consider advanced heart failure consultation to assist with management of HFpEF and severe pulmonary hypertension.  I do not believe that valvular disease is the primary cause of the patient's symptoms. 3. Primary prevention of coronary artery disease. RA (mean): 14 mmHg RV (S/EDP): 110/15 mmHg PA (S/D, mean): 105/40 (62) mmHg PCWP (mean): 25 mmHg Ao sat: 91% PA sat: 53 Fick CO: 6.8 L/min Fick CI: 4.3 L/min/m^2 PVR: 5.4 Wood units   RHC 10/20 RA = 8 RV = 53/3 PA = 57/12 (32) PCW = 14 (v waves to 32) Fick cardiac output/index = 8.4/4.5 PVR = 2.1 WU Ao sat = 99% PA sat = 73%, 74% High SVC sat = 74%   Renal artery Dopplers 02/08/20: Right: Normal size right kidney. Abnormal right Resistive Index.         Normal cortical thickness of right kidney. Evidence of a         greater than 60% stenosis of the right renal artery (low end         of range based on peak velocity). RRV flow present.  Left:  Normal size of left kidney. Abnormal left Resisitve Index.         Normal cortical thickness of the left kidney. 1-59% stenosis         of the left renal artery. LRV flow present.  Mesenteric:  70 to 99%  stenosis in the celiac artery and superior mesenteric artery.   Patient Profile     64 y.o. female with a hx of severe aortic stenosis, moderate mitral stenosis, pulmonary hypertension followed by Dr. Haroldine Laws, paroxysmal atrial fibrillation not on anticoagulation, hypertension, diabetes, myelodysplasitc syndrome, transfusion dependent anemia, and prior colon cancer s/p hemicolectomy who is being followed in consultation for the evaluation of CHF at the request of Dr. Lorin Mercy.  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 MS severe by gradient and moderate by PHT. AS severe by area and peak velocity. -strict I/O, daily weights -admission weight 102.7 kg, weight today 99.5 kg -she feels that her breathing improved with diuresis. Now off O2 -with rising creatinine, stopped spironolactone, unclear full benefit in her unique phenotype and have concerns about kidney function -on oral lasix 40 mg daily - patient to ambulate more today to confirm the feels like baseline  -per prior outpatient notes, not felt to be a candidate for intervention on her  AS or MS due to her comorbidities.  - agree with palliative care consultation  CKD Stage 4 Hypertension Renal artery stenosis -blood pressure has been variable this admission -Cr 3.05 today, was 3.21 on admission. Baseline runs 2.5-2.9, which has been CKD 4 with GFR 19-26. -continue amlodipine, carvedilol, clonidine, doxazosin, hydralazine -stopped aldactone as above - Monitor Cr and K closely  Paroxysmal atrial fibrillation Anemia, chronic myelodysplastic syndrome -continue amiodarone -not on anticoagulation due to chronic anemia/GI bleeding -anemia management per primary team, s/p transfusion   For questions or updates, please contact [facility] Please consult [email or web address] for contact info  under     Pixie Casino, MD, FACC, [place] Director of the [facility]id [facility]  [facility] Diplomate of the American Board of Clinical Lipidology Attending Cardiologist  Direct Dial: [phone]  Fax: [phone]  Website:  [email or web address]  Pixie Casino, MD  06/10/2020, 10:58 AM

## 2020-06-10 NOTE — Progress Notes (Signed)
Patient blood sugar drop to 55, snack given now up 86. MD notified. Will continue to monitor.

## 2020-06-10 NOTE — Consult Note (Addendum)
Nephrology Consult   Requesting provider: Dwyane Dee Service requesting consult: Hospitalist Reason for consult: AKI on CKD   Assessment/Recommendations: Patricia Mata is a/an 64 y.o. female with a past medical history AS, pHTN, pAfib, pHTN, HTN, DM2, RAS, anemia, CHF  who present w/ heart failure exacerbation complicated by AKI  AKI on CKD 4: Bl Crt around 2.3-2.9 but with frequent fluctuations. AKI likely related to fluid shifts with some exacerbation of her chronic cardiorenal syndrome.  Her fluid status seems optimized and her creatinine likely just needs time to settle out.  She is not too far above her baseline at this time.  No obvious uremic symptoms -Can continue oral diuretics and consider increasing if weight starts to climb, her volume status is very temperamental -Spironolactone had been used but holding for now -Likely can discharge once creatinine stabilized from renal standpoint -Palliative care has been consulted given her multiple issues -Continue to monitor daily Cr, Dose meds for GFR -Monitor Daily I/Os, Daily weight  -Maintain MAP>65 for optimal renal perfusion.  -Avoid nephrotoxic medications including NSAIDs and Vanc/Zosyn combo -Check Renal U/S to rule out obstruction and check UA -Currently no indication for HD  Volume Status/CHF/pulmonary hypertension: Appears relatively euvolemic.  Patient states her volume status is significantly improved but weight is only changed minimally (possibly not accurate).  Continue to monitor closely and continue diuretics. On sildenafil   Hypertension: Fairly extensive regimen including amlodipine 10 mg daily, carvedilol 12.5 mg twice daily, clonidine 0.1 mg twice daily, hydralazine 100 mg 3 times daily, doxazosin, and Lasix.  She has renal artery stenosis.  Recurrent volume overload particularly in the setting of hypertension could be a reason to consider intervention on the renal artery stenosis.  Defer to outpatient  providers.  Targeting a lower blood pressure may be ideal as she may be afterload sensitive  Anemia: Multifactorial, recurrent and ongoing issue.  Hemoglobin improved to 8.7 at this time.  Continue to monitor  Atrial fibrillation: On amiodarone.  Heart rate controlled at this time   Recommendations conveyed to primary service.    [facility] 06/10/2020 11:20 AM   _____________________________________________________________________________________ CC: AKI on CKD  History of Present Illness: Patricia Mata is a/an 64 y.o. female with a past medical history of AS, pHTN, pAfib, HTN, DM2, RAS, anemia, CHF who presents with worsening shortness of breath.Marland Kitchen  He presented on 11/24 with worsening shortness of breath that was causing it to be difficult to get out of bed.  She was concerned about pneumonia and decided come to the hospital for more evaluation.  She was compliant with her home medications including diuretics.  She does think that her weight had increased prior to admission.  She noticed worsening lower extremity edema.  She also had orthopnea.  In the emergency department she was found to be volume overloaded with crackles on exam.  Her hemoglobin was low at 7.  Creatinine was 3.2.  Chest x-ray demonstrated pulmonary edema.  She was admitted and given IV diuretics.  Her symptoms have significantly improved since admission with minimal shortness of breath and she feels like she is at her baseline.  Her blood pressure has improved since admission.  Creatinine initially improved to 3 and has now increased to 3.4.  She received a dose of oral Lasix 40 mg today. She follows w/ Dr. Carolin Sicks.  Medications:  Current Facility-Administered Medications  Medication Dose Route Frequency Provider Last Rate Last Admin  . (feeding supplement) PROSource Plus liquid  30 mL  30 mL Oral BID BM Karmen Bongo, MD   30 mL at 06/10/20 1029  . acetaminophen (TYLENOL)  tablet 650 mg  650 mg Oral Q6H PRN Karmen Bongo, MD       Or  . acetaminophen (TYLENOL) suppository 650 mg  650 mg Rectal Q6H PRN Karmen Bongo, MD      . allopurinol (ZYLOPRIM) tablet 300 mg  300 mg Oral Daily Karmen Bongo, MD   300 mg at 06/10/20 1029  . amiodarone (PACERONE) tablet 200 mg  200 mg Oral Daily Karmen Bongo, MD   200 mg at 06/10/20 1029  . amLODipine (NORVASC) tablet 10 mg  10 mg Oral Daily Karmen Bongo, MD   10 mg at 06/10/20 1028  . atorvastatin (LIPITOR) tablet 40 mg  40 mg Oral Daily Karmen Bongo, MD   40 mg at 06/10/20 1028  . bisacodyl (DULCOLAX) EC tablet 5 mg  5 mg Oral Daily PRN Dwyane Dee, MD      . calcium carbonate (dosed in mg elemental calcium) suspension 500 mg of elemental calcium  500 mg of elemental calcium Oral Q6H PRN Karmen Bongo, MD      . camphor-menthol [facility]) lotion 1 application  1 application Topical L3J PRN Karmen Bongo, MD       And  . hydrOXYzine (ATARAX/VISTARIL) tablet 25 mg  25 mg Oral Q8H PRN Karmen Bongo, MD   25 mg at 06/09/20 2139  . carvedilol (COREG) tablet 12.5 mg  12.5 mg Oral BID WC Karmen Bongo, MD   12.5 mg at 06/10/20 1029  . cloNIDine (CATAPRES) tablet 0.1 mg  0.1 mg Oral BID Karmen Bongo, MD   0.1 mg at 06/10/20 1028  . docusate sodium (COLACE) capsule 100 mg  100 mg Oral BID Karmen Bongo, MD   100 mg at 06/10/20 1028  . docusate sodium (ENEMEEZ) enema 283 mg  1 enema Rectal PRN Karmen Bongo, MD      . doxazosin (CARDURA) tablet 8 mg  8 mg Oral Daily Karmen Bongo, MD   8 mg at 06/10/20 1028  . feeding supplement (NEPRO CARB STEADY) liquid 237 mL  237 mL Oral TID PRN Karmen Bongo, MD      . furosemide (LASIX) tablet 40 mg  40 mg Oral Daily Chandrasekhar, Mahesh A, MD   40 mg at 06/10/20 1028  . hydrALAZINE (APRESOLINE) injection 5 mg  5 mg Intravenous Q4H PRN Karmen Bongo, MD      . hydrALAZINE (APRESOLINE) tablet 100 mg  100 mg Oral TID Karmen Bongo, MD   100 mg at 06/10/20 1030   . insulin aspart (novoLOG) injection 0-9 Units  0-9 Units Subcutaneous TID WC Karmen Bongo, MD   1 Units at 06/09/20 1842  . insulin glargine (LANTUS) injection 18 Units  18 Units Subcutaneous Daily Karmen Bongo, MD   18 Units at 06/10/20 1029  . latanoprost (XALATAN) 0.005 % ophthalmic solution 1 drop  1 drop Both Eyes QHS Karmen Bongo, MD   1 drop at 06/09/20 2140  . levothyroxine (SYNTHROID) tablet 150 mcg  150 mcg Oral Daily Karmen Bongo, MD   150 mcg at 06/10/20 0542  . morphine 2 MG/ML injection 2 mg  2 mg Intravenous Q2H PRN Karmen Bongo, MD      . multivitamin with minerals tablet 1 tablet  1 tablet Oral Daily Karmen Bongo, MD   1 tablet at 06/10/20 1028  . ondansetron (ZOFRAN) tablet 4 mg  4 mg Oral Q6H PRN Lorin Mercy,  Anderson Malta, MD       Or  . ondansetron [facility]) injection 4 mg  4 mg Intravenous Q6H PRN Karmen Bongo, MD      . oxyCODONE (Oxy IR/ROXICODONE) immediate release tablet 5 mg  5 mg Oral Q4H PRN Karmen Bongo, MD      . polyethylene glycol (MIRALAX / GLYCOLAX) packet 17 g  17 g Oral Daily PRN Karmen Bongo, MD      . sildenafil (REVATIO) tablet 20 mg  20 mg Oral TID Karmen Bongo, MD   20 mg at 06/10/20 1028  . sodium chloride flush (NS) 0.9 % injection 3 mL  3 mL Intravenous Q12H Karmen Bongo, MD   3 mL at 06/10/20 0543  . sorbitol 70 % solution 30 mL  30 mL Oral PRN Karmen Bongo, MD      . zolpidem Lorrin Mais) tablet 5 mg  5 mg Oral QHS PRN Karmen Bongo, MD   5 mg at 06/09/20 2139     ALLERGIES Ancef [cefazolin], Lactose intolerance (gi), and Sulfa antibiotics  MEDICAL HISTORY Past Medical History:  Diagnosis Date  . Acute renal failure (ARF) ([place]) 06/08/2018  . Cancer [facility]) 2008   Colon   . Chronic diastolic (congestive) heart failure ([place])   . Diabetes mellitus without complication ([place])   . Gout 09/08/2018  . Hypertension   . Macrocytic anemia   . MDS (myelodysplastic syndrome) ([place])   . Stage 4 chronic kidney disease ([place])       SOCIAL HISTORY Social History   Socioeconomic History  . Marital status: Married    Spouse name: Not on file  . Number of children: Not on file  . Years of education: Not on file  . Highest education level: Not on file  Occupational History  . Occupation: retired  Tobacco Use  . Smoking status: Former Smoker    Packs/day: 0.25    Years: 20.00    Pack years: 5.00    Types: Cigarettes    Quit date: 01/31/2001    Years since quitting: 19.3  . Smokeless tobacco: Former Network engineer  . Vaping Use: Never used  Substance and Sexual Activity  . Alcohol use: Yes    Alcohol/week: 2.0 standard drinks    Types: 2 Shots of liquor per week  . Drug use: Never  . Sexual activity: Not on file  Other Topics Concern  . Not on file  Social History Narrative  . Not on file   Social Determinants of Health   Financial Resource Strain: Medium Risk  . Difficulty of Paying Living Expenses: Somewhat hard  Food Insecurity:   . Worried About Charity fundraiser in the Last Year: Not on file  . Ran Out of Food in the Last Year: Not on file  Transportation Needs:   . Lack of Transportation (Medical): Not on file  . Lack of Transportation (Non-Medical): Not on file  Physical Activity:   . Days of Exercise per Week: Not on file  . Minutes of Exercise per Session: Not on file  Stress:   . Feeling of Stress : Not on file  Social Connections:   . Frequency of Communication with Friends and Family: Not on file  . Frequency of Social Gatherings with Friends and Family: Not on file  . Attends Religious Services: Not on file  . Active Member of Clubs or Organizations: Not on file  . Attends Archivist Meetings: Not on file  . Marital Status: Not on file  Intimate Partner Violence:   . Fear of Current or Ex-Partner: Not on file  . Emotionally Abused: Not on file  . Physically Abused: Not on file  . Sexually Abused: Not on file     FAMILY HISTORY Family History  Problem  Relation Age of Onset  . Hypertension Mother   . Diabetes Mother   . Cervical cancer Mother   . Heart attack Father   . Hypertension Sister   . Hypertension Brother   . Hypertension Sister   . Hypertension Sister   . Prostate cancer Brother   . HIV/AIDS Brother       Review of Systems: 12 systems reviewed Otherwise as per HPI, all other systems reviewed and negative  Physical Exam: Vitals:   06/10/20 0500 06/10/20 1000  BP: (!) 135/40 (!) 144/46  Pulse: 64 66  Resp: 19   Temp: 98.2 F (36.8 C) 98.7 F (37.1 C)  SpO2: 100% 94%   No intake/output data recorded.  Intake/Output Summary (Last 24 hours) at 06/10/2020 1120 Last data filed at 06/09/2020 2009 Gross per 24 hour  Intake 720 ml  Output 850 ml  Net -130 ml   General: well-appearing, no acute distress HEENT: anicteric sclera, oropharynx clear without lesions CV: Normal rate, 3 out of 6 systolic murmur, trace peripheral edema Lungs: clear to auscultation bilaterally, normal work of breathing Abd: soft, non-tender, non-distended Skin: no visible lesions or rashes Psych: alert, engaged, appropriate mood and affect Musculoskeletal: no obvious deformities Neuro: normal speech, no gross focal deficits   Test Results Reviewed Lab Results  Component Value Date   NA 135 06/10/2020   K 4.0 06/10/2020   CL 101 06/10/2020   CO2 22 06/10/2020   BUN 96 (H) 06/10/2020   CREATININE 3.41 (H) 06/10/2020   CALCIUM 9.2 06/10/2020   ALBUMIN 3.6 06/07/2020   PHOS 3.3 09/28/2019     I have reviewed all relevant outside healthcare records related to the patient's current hospitalization

## 2020-06-11 DIAGNOSIS — I6523 Occlusion and stenosis of bilateral carotid arteries: Secondary | ICD-10-CM

## 2020-06-11 DIAGNOSIS — I5033 Acute on chronic diastolic (congestive) heart failure: Secondary | ICD-10-CM | POA: Diagnosis not present

## 2020-06-11 DIAGNOSIS — J9601 Acute respiratory failure with hypoxia: Secondary | ICD-10-CM | POA: Diagnosis not present

## 2020-06-11 DIAGNOSIS — I350 Nonrheumatic aortic (valve) stenosis: Secondary | ICD-10-CM | POA: Diagnosis not present

## 2020-06-11 LAB — CBC WITH DIFFERENTIAL/PLATELET
Abs Immature Granulocytes: 0.04 10*3/uL (ref 0.00–0.07)
Basophils Absolute: 0 10*3/uL (ref 0.0–0.1)
Basophils Relative: 0 %
Eosinophils Absolute: 0.1 10*3/uL (ref 0.0–0.5)
Eosinophils Relative: 1 %
HCT: 24.5 % — ABNORMAL LOW (ref 36.0–46.0)
Hemoglobin: 8 g/dL — ABNORMAL LOW (ref 12.0–15.0)
Immature Granulocytes: 1 %
Lymphocytes Relative: 8 %
Lymphs Abs: 0.5 10*3/uL — ABNORMAL LOW (ref 0.7–4.0)
MCH: 30.4 pg (ref 26.0–34.0)
MCHC: 32.7 g/dL (ref 30.0–36.0)
MCV: 93.2 fL (ref 80.0–100.0)
Monocytes Absolute: 1.1 10*3/uL — ABNORMAL HIGH (ref 0.1–1.0)
Monocytes Relative: 18 %
Neutro Abs: 4.5 10*3/uL (ref 1.7–7.7)
Neutrophils Relative %: 72 %
Platelets: 138 10*3/uL — ABNORMAL LOW (ref 150–400)
RBC: 2.63 MIL/uL — ABNORMAL LOW (ref 3.87–5.11)
RDW: 21 % — ABNORMAL HIGH (ref 11.5–15.5)
WBC: 6.3 10*3/uL (ref 4.0–10.5)
nRBC: 1.1 % — ABNORMAL HIGH (ref 0.0–0.2)

## 2020-06-11 LAB — BASIC METABOLIC PANEL
Anion gap: 13 (ref 5–15)
BUN: 101 mg/dL — ABNORMAL HIGH (ref 8–23)
CO2: 21 mmol/L — ABNORMAL LOW (ref 22–32)
Calcium: 9 mg/dL (ref 8.9–10.3)
Chloride: 102 mmol/L (ref 98–111)
Creatinine, Ser: 3.42 mg/dL — ABNORMAL HIGH (ref 0.44–1.00)
GFR, Estimated: 14 mL/min — ABNORMAL LOW (ref >60)
Glucose, Bld: 161 mg/dL — ABNORMAL HIGH (ref 70–99)
Potassium: 4 mmol/L (ref 3.5–5.1)
Sodium: 136 mmol/L (ref 135–145)

## 2020-06-11 LAB — GLUCOSE, CAPILLARY
Glucose-Capillary: 154 mg/dL — ABNORMAL HIGH (ref 70–99)
Glucose-Capillary: 154 mg/dL — ABNORMAL HIGH (ref 70–99)

## 2020-06-11 LAB — IRON AND TIBC
Iron: 47 ug/dL (ref 28–170)
Saturation Ratios: 23 % (ref 10.4–31.8)
TIBC: 209 ug/dL — ABNORMAL LOW (ref 250–450)
UIBC: 162 ug/dL

## 2020-06-11 LAB — FERRITIN: Ferritin: 1377 ng/mL — ABNORMAL HIGH (ref 11–307)

## 2020-06-11 LAB — MAGNESIUM: Magnesium: 1.9 mg/dL (ref 1.7–2.4)

## 2020-06-11 MED ORDER — ALLOPURINOL 100 MG PO TABS
100.0000 mg | ORAL_TABLET | Freq: Every day | ORAL | 3 refills | Status: DC
Start: 2020-06-12 — End: 2020-07-09

## 2020-06-11 NOTE — Discharge Summary (Signed)
Physician Discharge Summary   Patricia Mata DJT:[id number] DOB: 11-02-1955 DOA: 06/07/2020  PCP: [name], FNP  Admit date: 06/07/2020 Discharge date: 06/11/2020  Admitted From: home Disposition:  home Discharging physician: Dwyane Dee, MD  Recommendations for Outpatient Follow-up:  1. May benefit from appropriate consults to discuss carotid stenosis and renal artery stenosis 2. Needs repeat BMP ~11/30. If worsening renal function needing more evaluation for possible HD, may need to return to hospital. Patient understands and wanted to go home to manage outpatient if able   Patient discharged to home in Discharge Condition: stable CODE STATUS: Full Diet recommendation:  Diet Orders (From admission, onward)    Start     Ordered   06/11/20 0000  Diet - low sodium heart healthy        06/11/20 1242   06/11/20 0000  Diet Carb Modified        06/11/20 1242   06/07/20 1209  Diet heart healthy/carb modified Room service appropriate? Yes; Fluid consistency: Thin; Fluid restriction: 1500 mL Fluid  Diet effective now       Question Answer Comment  Diet-HS Snack? Nothing   Room service appropriate? Yes   Fluid consistency: Thin   Fluid restriction: 1500 mL Fluid      06/07/20 1208          Hospital Course: Patricia Mata is a 64 y.o. female with medical history significant of HTN; DM; remote colon cancer/current MDS with transfusion-dependent anemia; chronic combined CHF; obesity (BMI 37); severe AS; and stage IV CKD presenting with SOB.    She also had reported weight gain at home over the past month, approximately 20 pounds was suspected.  She has also had increased lower extremity edema and worsening shortness of breath. Her last blood transfusion was on 05/18/2020 and she also receives EPO injections. CXR in the ER showed vascular congestion concerning for CHF/volume overload. She was also hypoxic and placed on 2 L nasal cannula. Hgb was 7.4 and she was given  1 unit PRBC and lasix. Given her presentation with complex underlying issues including severe AS, cardiology was also consulted on admission.  She was initially started on IV Lasix which was transitioned to oral Lasix after increase in creatinine.  She was also briefly started on spironolactone which was also stopped in setting of worsening renal function during hospitalization. She has undergone multiple evaluations with close follow-up outpatient with cardiology.  In the past she has not been considered a good candidate for intervention on her AS or MS due to multiple comorbidities.   In regards to her renal function, she was also followed by nephrology due to slight increase in creatinine and BUN with initial diuresis.  Her renal function was expected to stabilize.  She remained asymptomatic and overall clinically improved, therefore she was recommended for discharging home with close outpatient lab monitoring; if any signs of worsening, she would need to return as there is still potential for her requiring dialysis at some point which she would want. 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  Per nephrology, she may still benefit from outpatient evaluation especially in setting of resistant hypertension. Unfortunately, she has multiple vascular abnormalities in addition, including carotid artery stenosis which has progressed in the left ICA.  She may also need follow-up with vascular surgery as well regarding this.  She is mainly on Lipitor.  Due to her MDS and chronic anemia, she is not on aspirin or  anticoagulation.   * Acute hypoxemic respiratory failure (HCC)-resolved as of 06/10/2020 - considered multifactorial: chf exac, anemia, severe AS, mod MS - continue O2 and wean as able; weaned to room air - continue lasix  Acute on chronic diastolic CHF (congestive heart failure) (HCC) - worsening SOB, LE edema, and hypoxia on admission - echo repeat  ordered on admission: EF 60-65%, Gr2DD, mod LVH, mod MS, severe AS - cardiology following as well - continue lasix; changed to p.o. due to slightly worsening renal function.   -Spironolactone also discontinued due to to worsening renal function - home lasix continued at discharge  MDS (myelodysplastic syndrome) ([employer]) Most recently seen by Dr. Alen Blew on 11/4 -Previously had MDS treated with 5-azacytidine -On Reblozyl q3 weeks for anemia, with recurrent transfusions as needed -On palliative treatments only -EDP has ordered 2 units PRBC on admission  -Heme + stool without report of obvious GI bleeding - given multiple issues as noted, patient not wanting to be extremely aggressive in general; PCM has been consulted as well  -Not on aspirin or anticoagulation in general  Acute renal failure superimposed on stage 4 chronic kidney disease ([place]) - baseline creatinine 2.5-2.9; admitted with creat 3.21 - continue lasix and monitor renal response - improved then worsened with IV lasix and aldactone added; now on PO lasix, and spironolactone has been discontinued; agree with this; her volume status is already greatly improved as well - BMP and Mg in am -Still some worsening with renal function today.  Reaching out to nephrology for further input, greatly appreciate assistance.  For now, will monitor renal function to see if stabilizes; further plan to be determined from there -She has known bilateral renal artery stenosis, right greater than left. - after discussion with nephrology today, we think she is okay for going home with repeat labs in 1-2 days. I discussed with patient and she will set up for labs to be drawn on Tuesday and then further plans cane be made then; the hope is that they improve in the next couple days, and if they do worsen she understands she may need to return to the hospital for discussion more in depth of starting HD if warranted.   Hypothyroidism - continue synthroid    Type 2 diabetes mellitus with hemoglobin A1c goal of less than 7.0% (HCC) - continue SSI and CBG monitoring   Atrial fibrillation, transient (HCC) -Continue amiodarone -Not an anticoagulation candidate due to underlying MDS and chronic anemia  Carotid artery disease (HCC) - carotid u/s ordered on admission - chronic R occlusion; L ICA was 40-59% in 05/2019, now is 60-79% -Given no aggressive intervention from a cardiac standpoint, not likely to be a good candidate for vascular; will have her follow up outpatient with vascular surgery to discuss any further management if patient would want to be more aggressive  - continue lipitor   Aortic stenosis, severe -Again seen on repeat echo this admission -Outpatient notes indicate that patient not a good candidate for intervention on aortic or mitral valve due to underlying comorbidities  Hyperlipidemia - continue lipitor   Essential hypertension Dr. Oval Linsey added Coreg on 11/8 -Continue home meds - Norvasc, Coreg, Catapres, Cardura, Hydralazine - nephrology recommendations reviewed as well; resistant HTN and volume challenges may benefit from RAS intervention - prn IV hydralazine  Anemia due to chronic kidney disease -Baseline hemoglobin around 8 g/dL -Received transfusion on admission    The patient's chronic medical conditions were treated accordingly per the patient's home medication regimen  except as noted.  On day of discharge, patient was felt deemed stable for discharge. Patient/family member advised to call PCP or come back to ER if needed.   Principal Diagnosis: Acute hypoxemic respiratory failure [facility])  Discharge Diagnoses: Active Hospital Problems   Diagnosis Date Noted  . Acute on chronic diastolic CHF (congestive heart failure) ([place]) 06/07/2020    Priority: High  . MDS (myelodysplastic syndrome) ([place]) 03/20/2017    Priority: Medium  . Palliative care by specialist   . Acute renal failure superimposed on stage 4  chronic kidney disease ([place])   . Pulmonary hypertension ([place]) 09/20/2019  . Type 2 diabetes mellitus with hemoglobin A1c goal of less than 7.0% ([place]) 12/10/2018  . Hypothyroidism 12/10/2018  . Atrial fibrillation, transient ([place]) 05/11/2018  . Aortic stenosis, severe 06/10/2017  . Carotid artery disease ([place]) 06/10/2017  . Essential hypertension 05/13/2017  . Hyperlipidemia 05/13/2017  . Anemia due to chronic kidney disease 02/09/2016    Resolved Hospital Problems   Diagnosis Date Noted Date Resolved  . Acute hypoxemic respiratory failure ([place])  06/10/2020    Priority: High    Discharge Instructions    Diet - low sodium heart healthy   Complete by: As directed    Diet Carb Modified   Complete by: As directed    Discharge instructions   Complete by: As directed    Please have a BMP checked on 06/13/20 with your primary care. Follow up with cardiology and nephrology at discharge as well.  Have a repeat TSH (thyroid test) in approximately 4 weeks   Face-to-face encounter (required for Medicare/Medicaid patients)   Complete by: As directed    I Dwyane Dee certify that this patient is under my care and that I, or a nurse practitioner or physician's assistant working with me, had a face-to-face encounter that meets the physician face-to-face encounter requirements with this patient on 06/11/2020. The encounter with the patient was in whole, or in part for the following medical condition(s) which is the primary reason for home health care (List medical condition): DOE, generalized muscle weakness, CHF   The encounter with the patient was in whole, or in part, for the following medical condition, which is the primary reason for home health care: DOE, generalized muscle weakness, CHF   I certify that, based on my findings, the following services are medically necessary home health services: Physical therapy   Reason for Medically Necessary Home Health Services:  Therapy- Therapeutic  Exercises to Increase Strength and Endurance Therapy- Home Adaptation to Facilitate Safety     My clinical findings support the need for the above services: Shortness of breath with activity   Further, I certify that my clinical findings support that this patient is homebound due to:  Shortness of Breath with activity Ambulates short distances less than 300 feet     Home Health   Complete by: As directed    To provide the following care/treatments: PT   Increase activity slowly   Complete by: As directed      Allergies as of 06/11/2020      Reactions   Ancef [cefazolin] Itching   Severe itching- after procedure, ancef was the antibiotic.-04/02/17 Tolerates penicillins   Lactose Intolerance (gi) Diarrhea   Sulfa Antibiotics Rash, Other (See Comments)   Blisters, also      Medication List    TAKE these medications   allopurinol 100 MG tablet Commonly known as: ZYLOPRIM Take 1 tablet (100 mg total) by mouth daily. Start taking  on: June 12, 2020 What changed:   medication strength  how much to take   amiodarone 200 MG tablet Commonly known as: PACERONE Take 1 tablet (200 mg total) by mouth daily.   amLODipine 10 MG tablet Commonly known as: NORVASC Take 1 tablet (10 mg total) by mouth daily.   atorvastatin 40 MG tablet Commonly known as: LIPITOR Take 1 tablet (40 mg total) by mouth daily.   Basaglar KwikPen 100 UNIT/ML Inject 0.14 mLs (14 Units total) into the skin daily. What changed: how much to take   blood glucose meter kit and supplies Dispense based on patient and insurance preference. Use up to four times daily as directed. (FOR ICD-10 E10.9, E11.9). What changed:   how much to take  how to take this  when to take this   carvedilol 12.5 MG tablet Commonly known as: COREG Take 1 tablet (12.5 mg total) by mouth 2 (two) times daily.   Centrum Silver 50+Women Tabs Take 1 tablet by mouth daily.   cloNIDine 0.1 MG tablet Commonly known as:  Catapres Take 1 tablet (0.1 mg total) by mouth 2 (two) times daily.   colchicine 0.6 MG tablet Take 0.6 mg by mouth as directed. Take 1/2 tablet twice a day as needed for gout   doxazosin 8 MG tablet Commonly known as: CARDURA Take 1 tablet (8 mg total) by mouth daily.   fluticasone 50 MCG/ACT nasal spray Commonly known as: FLONASE Place 1 spray into both nostrils daily. What changed:   when to take this  reasons to take this   furosemide 40 MG tablet Commonly known as: LASIX Take 1 tablet by mouth once daily   glucose blood test strip check blood sugars twice daily Dx code E11.22 What changed:   how much to take  how to take this  when to take this   hydrALAZINE 100 MG tablet Commonly known as: APRESOLINE Take 1 tablet (100 mg total) by mouth 3 (three) times daily.   Insulin Pen Needle 29G X 5MM Misc Use as directed What changed:   how much to take  how to take this  when to take this   latanoprost 0.005 % ophthalmic solution Commonly known as: XALATAN Place 1 drop into both eyes at bedtime.   levothyroxine 125 MCG tablet Commonly known as: Synthroid Take 1 tablet (125 mcg total) by mouth daily.   linagliptin 5 MG Tabs tablet Commonly known as: Tradjenta Take 1 tablet (5 mg total) by mouth daily.   OneTouch Delica Plus [id number] Misc check blood sugars twice daily Dx code: E11.22 What changed:   how much to take  how to take this  when to take this   OneTouch UltraLink w/Device Kit Check blood sugars twice daily E11.9 What changed:   how much to take  how to take this  when to take this   oxymetazoline 0.05 % nasal spray Commonly known as: AFRIN Place 1 spray into both nostrils 2 (two) times daily as needed for congestion.   sildenafil 20 MG tablet Commonly known as: REVATIO TAKE 1 TABLET BY MOUTH THREE TIMES DAILY       Follow-up Information    [name], FNP. Call.   Specialty: General Practice Why: Call to have labs  (BMP) drawn on 06/13/20 Contact information: [address] [place] [address] [phone]        Lorretta Harp, MD .   Specialties: Cardiology, Radiology Contact information: [address] [place]  [place] [address] [phone]        [facility] Follow up.   Specialty: Home Health Services Why: For home health services. They will call you in 1-2 days to schedule your first home appointment.  Contact information: [place] [address] [phone]              Allergies  Allergen Reactions  . Ancef [Cefazolin] Itching    Severe itching- after procedure, ancef was the antibiotic.-04/02/17 Tolerates penicillins  . Lactose Intolerance (Gi) Diarrhea  . Sulfa Antibiotics Rash and Other (See Comments)    Blisters, also    Consultations: Cardiology Nephrology  Discharge Exam: BP (!) 147/45 (BP Location: Right Arm)   Pulse 64   Temp 98.6 F (37 C) (Oral)   Resp 20   Ht '5\' 4"'  (1.626 m)   Wt 99 kg Comment: scale b  SpO2 100%   BMI 37.45 kg/m  General appearance: alert, cooperative and no distress Head: Normocephalic, without obvious abnormality, atraumatic Eyes: EOMI Lungs: Improved breath sounds notably the crackles Heart: regular rate and rhythm, S1, S2 normal and 3/6 HSM heard throughout precordium Abdomen: normal findings: bowel sounds normal and soft, non-tender Extremities: 1+ lower extremity edema Skin: mobility and turgor normal Neurologic: Grossly normal  The results of significant diagnostics from this hospitalization (including imaging, microbiology, ancillary and laboratory) are listed below for reference.   Microbiology: Recent Results (from the past 240 hour(s))  Respiratory Panel by RT PCR (Flu A&B, Covid) - Nasopharyngeal Swab     Status: None   Collection Time: 06/07/20  8:26 AM   Specimen: Nasopharyngeal Swab; Nasopharyngeal(NP) swabs in vial transport medium  Result  Value Ref Range Status   SARS Coronavirus 2 by RT PCR NEGATIVE NEGATIVE Final    Comment: (NOTE) SARS-CoV-2 target nucleic acids are NOT DETECTED.  The SARS-CoV-2 RNA is generally detectable in upper respiratoy specimens during the acute phase of infection. The lowest concentration of SARS-CoV-2 viral copies this assay can detect is 131 copies/mL. A negative result does not preclude SARS-Cov-2 infection and should not be used as the sole basis for treatment or other patient management decisions. A negative result may occur with  improper specimen collection/handling, submission of specimen other than nasopharyngeal swab, presence of viral mutation(s) within the areas targeted by this assay, and inadequate number of viral copies (<131 copies/mL). A negative result must be combined with clinical observations, patient history, and epidemiological information. The expected result is Negative.  Fact Sheet for Patients:  PinkCheek.be  Fact Sheet for Healthcare Providers:  GravelBags.it  This test is no t yet approved or cleared by the [place] FDA and  has been authorized for detection and/or diagnosis of SARS-CoV-2 by FDA under an Emergency Use Authorization (EUA). This EUA will remain  in effect (meaning this test can be used) for the duration of the COVID-19 declaration under Section 564(b)(1) of the Act, 21 U.S.C. section 360bbb-3(b)(1), unless the authorization is terminated or revoked sooner.     Influenza A by PCR NEGATIVE NEGATIVE Final   Influenza B by PCR NEGATIVE NEGATIVE Final    Comment: (NOTE) The Xpert Xpress SARS-CoV-2/FLU/RSV assay is intended as an aid in  the diagnosis of influenza from Nasopharyngeal swab specimens and  should not be used as a sole basis for treatment. Nasal washings and  aspirates are unacceptable for Xpert Xpress SARS-CoV-2/FLU/RSV  testing.  Fact Sheet for  Patients: PinkCheek.be  Fact Sheet for Healthcare Providers: GravelBags.it  This test is not yet approved or cleared by the [place] and  has been authorized for detection and/or diagnosis of SARS-CoV-2 by  FDA under an Emergency Use Authorization (EUA). This EUA will remain  in effect (meaning this test can be used) for the duration of the  Covid-19 declaration under Section 564(b)(1) of the Act, 21  U.S.C. section 360bbb-3(b)(1), unless the authorization is  terminated or revoked. Performed at [facility], [place] [address] [place], [place] [address]      Labs: BNP (last 3 results) Recent Labs    09/14/19 0736 09/20/19 1823 06/07/20 0826  BNP 366.0* 548.2* 916.9*   Basic Metabolic Panel: Recent Labs  Lab 06/07/20 0825 06/08/20 0233 06/09/20 0300 06/10/20 0434 06/11/20 0352  NA 133* 137 139 135 136  K 4.0 4.1 3.9 4.0 4.0  CL 102 103 104 101 102  CO2 16* 20* 20* 22 21*  GLUCOSE 224* 154* 86 164* 161*  BUN 80* 80* 89* 96* 101*  CREATININE 3.21* 3.05* 3.35* 3.41* 3.42*  CALCIUM 9.1 9.5 9.7 9.2 9.0  MG  --   --  2.0 2.1 1.9   Liver Function Tests: Recent Labs  Lab 06/07/20 0825  AST 31  ALT 19  ALKPHOS 75  BILITOT 1.0  PROT 7.0  ALBUMIN 3.6   No results for input(s): LIPASE, AMYLASE in the last 168 hours. No results for input(s): AMMONIA in the last 168 hours. CBC: Recent Labs  Lab 06/07/20 0825 06/08/20 0233 06/09/20 0300 06/10/20 0434 06/11/20 0352  WBC 7.1 8.1 6.5 6.4 6.3  NEUTROABS  --   --  4.8 4.6 4.5  HGB 7.4* 9.1* 8.7* 8.7* 8.0*  HCT 23.1* 26.9* 25.6* 25.9* 24.5*  MCV 94.3 89.4 90.1 91.5 93.2  PLT 178 172 162 158 138*   Cardiac Enzymes: No results for input(s): CKTOTAL, CKMB, CKMBINDEX, TROPONINI in the last 168 hours. BNP: Invalid input(s): POCBNP CBG: Recent Labs  Lab 06/10/20 1222 06/10/20 1703 06/10/20 2109 06/11/20 0607 06/11/20 1125  GLUCAP 86 179*  161* 154* 154*   D-Dimer No results for input(s): DDIMER in the last 72 hours. Hgb A1c No results for input(s): HGBA1C in the last 72 hours. Lipid Profile No results for input(s): CHOL, HDL, LDLCALC, TRIG, CHOLHDL, LDLDIRECT in the last 72 hours. Thyroid function studies No results for input(s): TSH, T4TOTAL, T3FREE, THYROIDAB in the last 72 hours.  Invalid input(s): FREET3 Anemia work up Recent Labs    06/11/20 1048  FERRITIN 1,377*  TIBC 209*  IRON 47   Urinalysis    Component Value Date/Time   COLORURINE YELLOW 03/18/2019 1337   APPEARANCEUR CLEAR 03/18/2019 1337   LABSPEC 1.009 03/18/2019 1337   PHURINE 5.0 03/18/2019 1337   GLUCOSEU NEGATIVE 03/18/2019 1337   HGBUR NEGATIVE 03/18/2019 1337   BILIRUBINUR negative 05/11/2020 1152   KETONESUR NEGATIVE 03/18/2019 1337   PROTEINUR Positive (A) 05/11/2020 1152   PROTEINUR 100 (A) 03/18/2019 1337   UROBILINOGEN 0.2 05/11/2020 1152   NITRITE negative 05/11/2020 1152   NITRITE NEGATIVE 03/18/2019 1337   LEUKOCYTESUR Negative 05/11/2020 1152   LEUKOCYTESUR NEGATIVE 03/18/2019 1337   Sepsis Labs Invalid input(s): PROCALCITONIN,  WBC,  LACTICIDVEN Microbiology Recent Results (from the past 240 hour(s))  Respiratory Panel by RT PCR (Flu A&B, Covid) - Nasopharyngeal Swab     Status: None   Collection Time: 06/07/20  8:26 AM   Specimen: Nasopharyngeal Swab; Nasopharyngeal(NP) swabs in vial transport medium  Result Value Ref Range Status  SARS Coronavirus 2 by RT PCR NEGATIVE NEGATIVE Final    Comment: (NOTE) SARS-CoV-2 target nucleic acids are NOT DETECTED.  The SARS-CoV-2 RNA is generally detectable in upper respiratoy specimens during the acute phase of infection. The lowest concentration of SARS-CoV-2 viral copies this assay can detect is 131 copies/mL. A negative result does not preclude SARS-Cov-2 infection and should not be used as the sole basis for treatment or other patient management decisions. A negative  result may occur with  improper specimen collection/handling, submission of specimen other than nasopharyngeal swab, presence of viral mutation(s) within the areas targeted by this assay, and inadequate number of viral copies (<131 copies/mL). A negative result must be combined with clinical observations, patient history, and epidemiological information. The expected result is Negative.  Fact Sheet for Patients:  PinkCheek.be  Fact Sheet for Healthcare Providers:  GravelBags.it  This test is no t yet approved or cleared by the [place] FDA and  has been authorized for detection and/or diagnosis of SARS-CoV-2 by FDA under an Emergency Use Authorization (EUA). This EUA will remain  in effect (meaning this test can be used) for the duration of the COVID-19 declaration under Section 564(b)(1) of the Act, 21 U.S.C. section 360bbb-3(b)(1), unless the authorization is terminated or revoked sooner.     Influenza A by PCR NEGATIVE NEGATIVE Final   Influenza B by PCR NEGATIVE NEGATIVE Final    Comment: (NOTE) The Xpert Xpress SARS-CoV-2/FLU/RSV assay is intended as an aid in  the diagnosis of influenza from Nasopharyngeal swab specimens and  should not be used as a sole basis for treatment. Nasal washings and  aspirates are unacceptable for Xpert Xpress SARS-CoV-2/FLU/RSV  testing.  Fact Sheet for Patients: PinkCheek.be  Fact Sheet for Healthcare Providers: GravelBags.it  This test is not yet approved or cleared by the [place] FDA and  has been authorized for detection and/or diagnosis of SARS-CoV-2 by  FDA under an Emergency Use Authorization (EUA). This EUA will remain  in effect (meaning this test can be used) for the duration of the  Covid-19 declaration under Section 564(b)(1) of the Act, 21  U.S.C. section 360bbb-3(b)(1), unless the authorization is   terminated or revoked. Performed at [facility], [place] [address], [place], [place] [address]     Procedures/Studies: US RENAL  Result Date: 06/10/2020 CLINICAL DATA:  Acute renal injury. EXAM: RENAL / URINARY TRACT ULTRASOUND COMPLETE COMPARISON:  None. FINDINGS: Right Kidney: Renal measurements: 10.9 cm x 5.3 cm x 5.7 cm = volume: 170.1 mL. There is diffusely increased echogenicity of the renal parenchyma. No mass or hydronephrosis visualized. Left Kidney: Renal measurements: 12.7 cm x 5.2 cm x 5.2 cm = volume: 177.7 mL. There is diffusely increased echogenicity of the renal parenchyma. A 0.9 cm x 0.8 cm x 0.9 cm anechoic structure is seen within the mid left kidney. No flow is seen within this region on color Doppler evaluation. No hydronephrosis is visualized. Bladder: Appears normal for degree of bladder distention. Bilateral ureteral jets are seen. Other: None. IMPRESSION: 1. Bilateral increased renal echogenicity which is likely secondary to medical renal disease. 2. Very small left renal cyst. Electronically Signed   By: Virgina Norfolk M.D.   On: 06/10/2020 16:47   DG Chest Port 1 View  Result Date: 06/07/2020 CLINICAL DATA:  Shortness of breath EXAM: PORTABLE CHEST 1 VIEW COMPARISON:  12/07/2019 FINDINGS: Cardiac shadow is enlarged. Vascular congestion is identified increasing parenchymal opacity likely representing edema although acute multifocal pneumonia is  not excluded. Small effusions are noted bilaterally. No bony abnormality is seen. Aortic atherosclerotic changes are noted. IMPRESSION: Vascular congestion with bilateral airspace opacity likely related to underlying edema. Multifocal pneumonia cannot be totally excluded on the basis of this exam. Continued follow-up is recommended Electronically Signed   By: Inez Catalina M.D.   On: 06/07/2020 08:33   ECHOCARDIOGRAM COMPLETE  Result Date: 06/07/2020    ECHOCARDIOGRAM REPORT   Patient Name:   Patricia Mata Remley Date of  Exam: 06/07/2020 Medical Rec #:  [id number]            Height:       64.0 in Accession #:    [id number]           Weight:       226.4 lb Date of Birth:  [birth year]             BSA:          2.062 m Patient Age:    64 years             BP:           173/37 mmHg Patient Gender: F                    HR:           67 bpm. Exam Location:  Inpatient Procedure: 2D Echo Indications:    CHF  History:        Patient has prior history of Echocardiogram examinations, most                 recent 09/21/2019. Aortic Valve Disease; Risk Factors:Hypertension                 and Diabetes.  Sonographer:    Mikki Santee RDCS (AE) Referring Phys: Dennis  1. Left ventricular ejection fraction, by estimation, is 60 to 65%. The left ventricle has normal function. The left ventricle has no regional wall motion abnormalities. There is moderate concentric left ventricular hypertrophy. Left ventricular diastolic parameters are consistent with Grade II diastolic dysfunction (pseudonormalization). Elevated left ventricular end-diastolic pressure.  2. Right ventricular systolic function is normal. The right ventricular size is normal. There is moderately elevated pulmonary artery systolic pressure. The estimated right ventricular systolic pressure is 46.5 mmHg.  3. Left atrial size was moderately dilated.  4. Right atrial size was severely dilated.  5. 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 The mean mitral valve gradient is 10.0 mmHg. Severe mitral annular calcification.  6. Tricuspid valve regurgitation is mild to moderate.  7. The aortic valve is calcified. Aortic valve regurgitation is trivial. Severe aortic valve stenosis. Aortic valve area, by VTI measures 0.84 cm. Aortic valve Vmax measures 4.07 m/s.  8. The inferior vena cava is dilated in size with <50% respiratory variability, suggesting right atrial pressure of 15 mmHg. Comparison(s): No significant change  from prior study. FINDINGS  Left Ventricle: Left ventricular ejection fraction, by estimation, is 60 to 65%. The left ventricle has normal function. The left ventricle has no regional wall motion abnormalities. The left ventricular internal cavity size was normal in size. There is  moderate concentric left ventricular hypertrophy. Left ventricular diastolic parameters are consistent with Grade II diastolic dysfunction (pseudonormalization). Elevated left ventricular end-diastolic pressure. Right Ventricle: The right ventricular size is normal. Right vetricular wall thickness was not well visualized. Right ventricular systolic function is normal. There is moderately elevated pulmonary  artery systolic pressure. The tricuspid regurgitant velocity is 3.14 m/s, and with an assumed right atrial pressure of 15 mmHg, the estimated right ventricular systolic pressure is 32.5 mmHg. Left Atrium: Left atrial size was moderately dilated. Right Atrium: Right atrial size was severely dilated. Pericardium: Trivial pericardial effusion is present. Mitral Valve: Mean gradient is 10 mmHg at 64 bpm, but valve area is 2.50 cm2 by PHT. The mitral valve is rheumatic. There is moderate thickening of the mitral valve leaflet(s). There is moderate calcification of the mitral valve leaflet(s). Severe mitral  annular calcification. Mild mitral valve regurgitation. Moderate mitral valve stenosis. MV peak gradient, 32.5 mmHg. The mean mitral valve gradient is 10.0 mmHg. Tricuspid Valve: The tricuspid valve is normal in structure. Tricuspid valve regurgitation is mild to moderate. No evidence of tricuspid stenosis. Aortic Valve: The aortic valve is calcified. Aortic valve regurgitation is trivial. Aortic regurgitation PHT measures 448 msec. Severe aortic stenosis is present. Aortic valve mean gradient measures 35.0 mmHg. Aortic valve peak gradient measures 66.3 mmHg. Aortic valve area, by VTI measures 0.84 cm. Pulmonic Valve: The pulmonic valve  was not well visualized. Pulmonic valve regurgitation is trivial. No evidence of pulmonic stenosis. Aorta: The aortic root and ascending aorta are structurally normal, with no evidence of dilitation. Venous: The inferior vena cava is dilated in size with less than 50% respiratory variability, suggesting right atrial pressure of 15 mmHg. IAS/Shunts: The atrial septum is grossly normal.  LEFT VENTRICLE PLAX 2D LVIDd:         4.40 cm  Diastology LVIDs:         2.80 cm  LV e' medial:    6.22 cm/s LV PW:         1.40 cm  LV E/e' medial:  30.7 LV IVS:        1.30 cm  LV e' lateral:   8.59 cm/s LVOT diam:     2.00 cm  LV E/e' lateral: 22.2 LV SV:         89 LV SV Index:   43 LVOT Area:     3.14 cm  RIGHT VENTRICLE TAPSE (M-mode): 1.9 cm LEFT ATRIUM             Index       RIGHT ATRIUM           Index LA diam:        5.00 cm 2.42 cm/m  RA Area:     29.70 cm LA Vol (A2C):   83.6 ml 40.54 ml/m RA Volume:   100.00 ml 48.49 ml/m LA Vol (A4C):   72.3 ml 35.06 ml/m LA Biplane Vol: 79.0 ml 38.31 ml/m  AORTIC VALVE AV Area (Vmax):    0.71 cm AV Area (Vmean):   0.71 cm AV Area (VTI):     0.84 cm AV Vmax:           407.00 cm/s AV Vmean:          280.000 cm/s AV VTI:            1.050 m AV Peak Grad:      66.3 mmHg AV Mean Grad:      35.0 mmHg LVOT Vmax:         91.40 cm/s LVOT Vmean:        62.900 cm/s LVOT VTI:          0.282 m LVOT/AV VTI ratio: 0.27 AI PHT:            448  msec  AORTA Ao Root diam: 3.20 cm MITRAL VALVE                TRICUSPID VALVE MV Area (PHT): 2.76 cm     TR Peak grad:   39.4 mmHg MV Peak grad:  32.5 mmHg    TR Vmax:        314.00 cm/s MV Mean grad:  10.0 mmHg MV Vmax:       2.85 m/s     SHUNTS MV Vmean:      140.0 cm/s   Systemic VTI:  0.28 m MV Decel Time: 275 msec     Systemic Diam: 2.00 cm MV E velocity: 191.00 cm/s MV A velocity: 107.00 cm/s MV E/A ratio:  1.79 Buford Dresser MD Electronically signed by Buford Dresser MD Signature Date/Time: 06/07/2020/7:40:21 PM    Final    US  Pelvic Complete With Transvaginal  Result Date: 05/30/2020 CLINICAL DATA:  Initial evaluation for abnormal vaginal bleeding. EXAM: TRANSABDOMINAL AND TRANSVAGINAL ULTRASOUND OF PELVIS TECHNIQUE: Both transabdominal and transvaginal ultrasound examinations of the pelvis were performed. Transabdominal technique was performed for global imaging of the pelvis including uterus, ovaries, adnexal regions, and pelvic cul-de-sac. It was necessary to proceed with endovaginal exam following the transabdominal exam to visualize the uterus, endometrium, and ovaries. COMPARISON:  Prior MRI from 06/09/2018 FINDINGS: Uterus Measurements: 6.0 x 2.8 x 5.6 cm = volume: 67 mL. Uterus is anteverted. Several partially calcified uterine fibroids are seen clustered about the uterine fundus, largest of which measures up to 3.8 cm in size. Endometrium Thickness: Approximately 7-8 mm. No focal abnormality visualized. Small amount of simple anechoic fluid present within the endometrial cavity. Right ovary Not visualized.  No adnexal mass. Left ovary Measurements: 2.1 x 1.2 x 1.2 cm = volume: 1.6 mL. Normal appearance/no adnexal mass. Other findings Small volume free fluid present within the pelvis. IMPRESSION: 1. Endometrial stripe thickened up to 7-8 mm, with small volume free fluid within the endometrial cavity. In the setting of post-menopausal bleeding, endometrial sampling is indicated to exclude carcinoma. If results are benign, sonohysterogram should be considered for focal lesion work-up. (Ref: Radiological Reasoning: Algorithmic Workup of Abnormal Vaginal Bleeding with Endovaginal Sonography and Sonohysterography. AJR 2008; 549:I26-41). 2. Multiple scattered calcified uterine fibroids as above. 3. Normal left ovary, with visualization of the right ovary. No adnexal mass. 4. Small volume free fluid within the pelvis. Electronically Signed   By: Jeannine Boga M.D.   On: 05/30/2020 19:09   VAS US CAROTID  Result Date:  06/08/2020 Carotid Arterial Duplex Study Indications:       History of carotid artery stenosis. Risk Factors:      Hypertension, Diabetes. 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 Limitations        Today's exam was limited due to the high bifurcation of the                    carotid, the body habitus of the patient, heavy calcification                    and the resulting shadowing and the patient's respiratory                    variation. Comparison Study:  06/03/2019 Carotid artery duplex- occluded right ICA,  left                    ICA was noted to have 40-59% stenosis, however this was                    diagnosed at the mid ICA for which criteria does not apply.                    Proximal left ICA velocity was documented at 180/35 cm/s,                    consistent with upper range 1-39% stenosis. Performing Technologist: Maudry Mayhew MHA, RDMS, RVT, RDCS  Examination Guidelines: A complete evaluation includes B-mode imaging, spectral Doppler, color Doppler, and power Doppler as needed of all accessible portions of each vessel. Bilateral testing is considered an integral part of a complete examination. Limited examinations for reoccurring indications may be performed as noted.  Right Carotid Findings: +----------+--------+--------+--------+---------------------+------------------+           PSV cm/sEDV cm/sStenosisPlaque Description   Comments           +----------+--------+--------+--------+---------------------+------------------+ CCA Prox  33                      smooth and                                                                heterogenous                            +----------+--------+--------+--------+---------------------+------------------+ CCA Distal15                      smooth and                                                                heterogenous                             +----------+--------+--------+--------+---------------------+------------------+ ICA Prox  17                                           thumped waveform   +----------+--------+--------+--------+---------------------+------------------+ ICA Distal12                                           thumped waveform   +----------+--------+--------+--------+---------------------+------------------+ ECA       12                      heterogenous and     Thumped waveform  irregular            with diastolic                                                            reversal           +----------+--------+--------+--------+---------------------+------------------+ +----------+--------+-------+---------+-------------------+           PSV cm/sEDV cmsDescribe Arm Pressure (mmHG) +----------+--------+-------+---------+-------------------+ Subclavian272            Turbulent                    +----------+--------+-------+---------+-------------------+ +---------+--------+---+--------+--+---------+ VertebralPSV cm/s127EDV cm/s17Antegrade +---------+--------+---+--------+--+---------+  Left Carotid Findings: +----------+-------+-------+--------+---------------------------------+--------+           PSV    EDV    StenosisPlaque Description               Comments           cm/s   cm/s                                                     +----------+-------+-------+--------+---------------------------------+--------+ CCA Prox  213    33             smooth and heterogenous                   +----------+-------+-------+--------+---------------------------------+--------+ CCA Distal154    31             heterogenous, irregular and                                               calcific                                  +----------+-------+-------+--------+---------------------------------+--------+ ICA Prox   243    33             heterogenous and calcific                 +----------+-------+-------+--------+---------------------------------+--------+ ICA Mid   279    37                                                       +----------+-------+-------+--------+---------------------------------+--------+ ICA Distal209    33                                                       +----------+-------+-------+--------+---------------------------------+--------+ ECA       375    29     >50%    heterogenous and calcific                 +----------+-------+-------+--------+---------------------------------+--------+ +----------+--------+--------+----------------+-------------------+  PSV cm/sEDV cm/sDescribe        Arm Pressure (mmHG) +----------+--------+--------+----------------+-------------------+ [id number]             Multiphasic, WNL                    +----------+--------+--------+----------------+-------------------+ +---------+--------+---+--------+--+---------+ VertebralPSV cm/s146EDV cm/s26Antegrade +---------+--------+---+--------+--+---------+   Summary: Right Carotid: Previous carotid artery duplex noted occlusion of right ICA,                however a dampened waveform was detected by pulsed wave Doppler                in the proximal and distal ICA, suggestive of likely near                occlusion with possible distal occlusion. Left Carotid: Peak systolic ICA velocities suggestive of 60-79% stenosis,               however end diastolic velocities suggest upper range 1-39%               stenosis. Increased perfusion may be secondary to compensatory               flow, considering right ICA disease. Vertebrals:  Bilateral vertebral arteries demonstrate antegrade flow. Subclavians: Right subclavian artery flow was disturbed. Normal flow              hemodynamics were seen in the left subclavian artery. *See table(s) above for measurements and  observations.  Electronically signed by Antony Contras MD on 06/08/2020 at 11:43:24 AM.    Final      Time coordinating discharge: Over 30 minutes    Dwyane Dee, MD  [facility] 06/11/2020, 4:14 PM

## 2020-06-11 NOTE — Progress Notes (Signed)
Progress Note  Patient Name: Patricia Mata Date of Encounter: 06/11/2020  [facility] Cardiologist: Quay Burow, MD   Subjective   No significant changes overnight- seen by nephrology yesterday. They have signed off with plans for outpatient follow-up.   Inpatient Medications    Scheduled Meds: . (feeding supplement) PROSource Plus  30 mL Oral BID BM  . allopurinol  100 mg Oral Daily  . amiodarone  200 mg Oral Daily  . amLODipine  10 mg Oral Daily  . atorvastatin  40 mg Oral Daily  . carvedilol  12.5 mg Oral BID WC  . cloNIDine  0.1 mg Oral BID  . docusate sodium  100 mg Oral BID  . doxazosin  8 mg Oral Daily  . furosemide  40 mg Oral Daily  . hydrALAZINE  100 mg Oral TID  . insulin aspart  0-9 Units Subcutaneous TID WC  . latanoprost  1 drop Both Eyes QHS  . levothyroxine  150 mcg Oral Daily  . multivitamin with minerals  1 tablet Oral Daily  . sildenafil  20 mg Oral TID  . sodium chloride flush  3 mL Intravenous Q12H   Continuous Infusions:  PRN Meds: acetaminophen **OR** acetaminophen, bisacodyl, calcium carbonate (dosed in mg elemental calcium), camphor-menthol **AND** hydrOXYzine, docusate sodium, feeding supplement (NEPRO CARB STEADY), hydrALAZINE, morphine injection, ondansetron **OR** ondansetron (ZOFRAN) IV, oxyCODONE, polyethylene glycol, sorbitol, zolpidem   Vital Signs    Vitals:   06/11/20 0535 06/11/20 0929 06/11/20 1204 06/11/20 1208  BP: (!) 145/45 (!) 150/49 (!) (P) 149/49 (!) 149/49  Pulse: (!) 56 62 (P) 64 64  Resp: 18 18 (P) 20 20  Temp: 97.8 F (36.6 C) 98.2 F (36.8 C) (P) 98.6 F (37 C) 98.6 F (37 C)  TempSrc: Oral Oral  Oral  SpO2: 98% 95% (P) 100% 100%  Weight: 99 kg     Height:        Intake/Output Summary (Last 24 hours) at 06/11/2020 1224 Last data filed at 06/11/2020 0600 Gross per 24 hour  Intake 1003 ml  Output 1100 ml  Net -97 ml   Last 3 Weights 06/11/2020 06/10/2020 06/09/2020  Weight (lbs) 218 lb 3.2  oz 219 lb 6.4 oz 219 lb 2.2 oz  Weight (kg) 98.975 kg 99.519 kg 99.4 kg      Telemetry    SR- Personally Reviewed  ECG    N/A  Physical Exam   GEN: No acute distress.   Neck: No JVD visible but difficult body habitus Cardiac: regular S1 and S2. Harsh mid peaking 3/6 systolic ejection murmur. Subtle I/IV diastolic rumble Respiratory: Clear to auscultation bilaterally. GI: Soft, nontender, non-distended  MS: No edema; No deformity. Neuro:  Nonfocal  Psych: Normal affect   Labs    High Sensitivity Troponin:  No results for input(s): TROPONINIHS in the last 720 hours.    Chemistry Recent Labs  Lab 06/07/20 0825 06/08/20 0233 06/09/20 0300 06/10/20 0434 06/11/20 0352  NA 133*   < > 139 135 136  K 4.0   < > 3.9 4.0 4.0  CL 102   < > 104 101 102  CO2 16*   < > 20* 22 21*  GLUCOSE 224*   < > 86 164* 161*  BUN 80*   < > 89* 96* 101*  CREATININE 3.21*   < > 3.35* 3.41* 3.42*  CALCIUM 9.1   < > 9.7 9.2 9.0  PROT 7.0  --   --   --   --  ALBUMIN 3.6  --   --   --   --   AST 31  --   --   --   --   ALT 19  --   --   --   --   ALKPHOS 75  --   --   --   --   BILITOT 1.0  --   --   --   --   GFRNONAA 15*   < > 15* 14* 14*  ANIONGAP 15   < > 15 12 13    < > = values in this interval not displayed.     Hematology Recent Labs  Lab 06/09/20 0300 06/10/20 0434 06/11/20 0352  WBC 6.5 6.4 6.3  RBC 2.84* 2.83* 2.63*  HGB 8.7* 8.7* 8.0*  HCT 25.6* 25.9* 24.5*  MCV 90.1 91.5 93.2  MCH 30.6 30.7 30.4  MCHC 34.0 33.6 32.7  RDW 20.6* 21.0* 21.0*  PLT 162 158 138*    BNP Recent Labs  Lab 06/07/20 0826  BNP 489.2*     DDimer No results for input(s): DDIMER in the last 168 hours.   Radiology    US RENAL  Result Date: 06/10/2020 CLINICAL DATA:  Acute renal injury. EXAM: RENAL / URINARY TRACT ULTRASOUND COMPLETE COMPARISON:  None. FINDINGS: Right Kidney: Renal measurements: 10.9 cm x 5.3 cm x 5.7 cm = volume: 170.1 mL. There is diffusely increased echogenicity of  the renal parenchyma. No mass or hydronephrosis visualized. Left Kidney: Renal measurements: 12.7 cm x 5.2 cm x 5.2 cm = volume: 177.7 mL. There is diffusely increased echogenicity of the renal parenchyma. A 0.9 cm x 0.8 cm x 0.9 cm anechoic structure is seen within the mid left kidney. No flow is seen within this region on color Doppler evaluation. No hydronephrosis is visualized. Bladder: Appears normal for degree of bladder distention. Bilateral ureteral jets are seen. Other: None. IMPRESSION: 1. Bilateral increased renal echogenicity which is likely secondary to medical renal disease. 2. Very small left renal cyst. Electronically Signed   By: Virgina Norfolk M.D.   On: 06/10/2020 16:47    Cardiac Studies   IMPRESSIONS     1. Left ventricular ejection fraction, by estimation, is 60 to 65%. The  left ventricle has normal function. The left ventricle has no regional  wall motion abnormalities. There is moderate concentric left ventricular  hypertrophy. Left ventricular  diastolic parameters are consistent with Grade II diastolic dysfunction  (pseudonormalization). Elevated left ventricular end-diastolic pressure.   2. Right ventricular systolic function is normal. The right ventricular  size is normal. There is moderately elevated pulmonary artery systolic  pressure. The estimated right ventricular systolic pressure is 14.7 mmHg.   3. Left atrial size was moderately dilated.   4. Right atrial size was severely dilated.   5. The mitral valve is rheumatic. Mild mitral valve regurgitation.  Moderate mitral stenosis. The mean mitral valve gradient is 10.0 mmHg.  Severe mitral annular calcification.   6. Tricuspid valve regurgitation is mild to moderate.   7. The aortic valve is calcified. Aortic valve regurgitation is trivial.  Severe aortic valve stenosis. Aortic valve area, by VTI measures 0.84 cm.  Aortic valve Vmax measures 4.07 m/s.   8. The inferior vena cava is dilated in size with  <50% respiratory  variability, suggesting right atrial pressure of 15 mmHg.   Echo 05/12/18: - Left ventricle: The cavity size was normal. There was moderate    concentric hypertrophy. Systolic function was vigorous. The  estimated ejection fraction was in the range of 65% to 70%. Wall    motion was normal; there were no regional wall motion    abnormalities. Features are consistent with a pseudonormal left    ventricular filling pattern, with concomitant abnormal relaxation    and increased filling pressure (grade 2 diastolic dysfunction).    Doppler parameters are consistent with high ventricular filling    pressure.  - Aortic valve: Trileaflet; moderately thickened, moderately    calcified leaflets. Valve mobility was restricted. There was    moderate stenosis. Mean gradient (S): 22 mm Hg. VTI ratio of LVOT    to aortic valve: 0.37. Valve area (VTI): 1.28 cm^2. Valve area    (Vmax): 1.01 cm^2. Valve area (Vmean): 1.18 cm^2.  - Mitral valve: Calcified annulus. Moderate diffuse thickening,    calcification, and elongation. The findings are consistent with    mild to moderate stenosis. Deceleration time: 324 ms.  - Tricuspid valve: There was moderate regurgitation.  - Pulmonic valve: There was trivial regurgitation.  - Pulmonary arteries: PA peak pressure: 93 mm Hg (S).   Echo 09/21/19:  1. Left ventricular ejection fraction, by estimation, is 65 to 70%. The  left ventricle has hyperdynamic function. The left ventricle has no  regional wall motion abnormalities. Left ventricular diastolic parameters  are consistent with Grade II diastolic  dysfunction (pseudonormalization). Elevated left atrial pressure.   2. Right ventricular systolic function is normal. The right ventricular  size is normal. There is moderately elevated pulmonary artery systolic  pressure.   3. Left atrial size was severely dilated.   4. The mitral valve is rheumatic. Mild to moderate mitral valve   regurgitation. Mild to moderate mitral stenosis. The mean mitral valve  gradient is 9.4 mmHg with average heart rate of 81 bpm.   5. The aortic valve has an indeterminant number of cusps. Aortic valve  regurgitation is trivial. Severe aortic valve stenosis. Aortic valve area,  by VTI measures 0.82 cm. Aortic valve mean gradient measures 42.0 mmHg.  Aortic valve Vmax measures 4.32  m/s.   6. The inferior vena cava is normal in size with greater than 50%  respiratory variability, suggesting right atrial pressure of 3 mmHg.   7. Agitated saline contrast bubble study was negative, with no evidence  of any interatrial shunt.   Comparison(s): Prior images reviewed side by side. Changes from prior  study are noted. Increased mitral gradients are partly explained by  increased heart rate and cardiac output.    LHC/RHC 05/21/18: Conclusions: 1. No angiographically significant coronary artery disease. 2. Severe pulmonary hypertension (mean PAP 62 mmHg; PVR 5.4 WU). 3. Mildly elevated left heart filling pressure. 4. Moderately elevated right heart filling pressure.  Right atrial waveform has an "M" or "W" appearance that suggests restrictive/contrictive physiology. 5. Moderate aortic stenosis. 6. Probably moderate mitral stenosis.  Transmitral gradient (12 mmHg) may be accentuated by mitral regurgitation and anemia.  Prominent v-waves also suggest a degree of mitral regurgitation. 7. Normal to supranormal Fick cardiac output/index.   Recommendations: 1. Anticipate restarting diuresis tomorrow, as renal function allows following contrast administration today. 2. Consider advanced heart failure consultation to assist with management of HFpEF and severe pulmonary hypertension.  I do not believe that valvular disease is the primary cause of the patient's symptoms. 3. Primary prevention of coronary artery disease. RA (mean): 14 mmHg RV (S/EDP): 110/15 mmHg PA (S/D, mean): 105/40 (62) mmHg PCWP  (mean): 25 mmHg Ao sat: 91% PA sat: 53  Fick CO: 6.8 L/min Fick CI: 4.3 L/min/m^2 PVR: 5.4 Wood units   RHC 10/20 RA = 8 RV = 53/3 PA = 57/12 (32) PCW = 14 (v waves to 32) Fick cardiac output/index = 8.4/4.5 PVR = 2.1 WU Ao sat = 99% PA sat = 73%, 74% High SVC sat = 74%   Renal artery Dopplers 02/08/20: Right: Normal size right kidney. Abnormal right Resistive Index.         Normal cortical thickness of right kidney. Evidence of a         greater than 60% stenosis of the right renal artery (low end         of range based on peak velocity). RRV flow present.  Left:  Normal size of left kidney. Abnormal left Resisitve Index.         Normal cortical thickness of the left kidney. 1-59% stenosis         of the left renal artery. LRV flow present.  Mesenteric:  70 to 99% stenosis in the celiac artery and superior mesenteric artery.   Patient Profile     64 y.o. female with a hx of severe aortic stenosis, moderate mitral stenosis, pulmonary hypertension followed by Dr. Haroldine Laws, paroxysmal atrial fibrillation not on anticoagulation, hypertension, diabetes, myelodysplasitc syndrome, transfusion dependent anemia, and prior colon cancer s/p hemicolectomy who is being followed in consultation for the evaluation of CHF at the request of Dr. Lorin Mercy.  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 MS severe by gradient and moderate by PHT. AS severe by area and peak velocity. -strict I/O, daily weights -admission weight 102.7 kg, weight today 99.5 kg -she feels that her breathing improved with diuresis. Now off O2 -with rising creatinine, stopped spironolactone, unclear full benefit in her unique phenotype and have concerns about kidney function -on oral lasix 40 mg daily - patient to ambulate more today to confirm the feels like baseline  -per  prior outpatient notes, not felt to be a candidate for intervention on her AS or MS due to her comorbidities.  - agree with palliative care consultation - no acute recomendations  CKD Stage 4 Hypertension Renal artery stenosis -blood pressure has been variable this admission -Cr 3.05 today, was 3.21 on admission. Baseline runs 2.5-2.9, which has been CKD 4 with GFR 19-26. -continue amlodipine, carvedilol, clonidine, doxazosin, hydralazine -stopped aldactone as above - Monitor Cr and K closely  Paroxysmal atrial fibrillation Anemia, chronic myelodysplastic syndrome -continue amiodarone -not on anticoagulation due to chronic anemia/GI bleeding -anemia management per primary team, s/p transfusion  Ok to d/c from a cardiac standpoint. Has HTN clinic follow-up with pharmacist in early December.  For questions or updates, please contact [place] Please consult [email or web address] for contact info under     Pixie Casino, MD, FACC, [place] Director of the [facility] Diplomate of the American Board of Clinical Lipidology Attending Cardiologist  Direct Dial: [phone]  Fax: [phone]  Website:  [email or web address]  Pixie Casino, MD  06/11/2020, 12:24 PM

## 2020-06-11 NOTE — TOC Transition Note (Signed)
Transition of Care [facility]) - CM/SW Discharge Note   Patient Details  Name: Patricia Mata MRN: [id number] Date of Birth: January 11, 1956  [facility] CM/SW Contact:  Carles Collet, RN Phone Number: 06/11/2020, 2:14 PM   Clinical Narrative:   Spoke to patient about recommendations for [facility] PT services. She was agreeable to [facility], and we discussed medicare ratings and providers. Export referral made to [facility] and accepted. Patient denies needs for any DME. No other CM needs identified.     Final next level of care: [place] Barriers to Discharge: No Barriers Identified   Patient Goals and CMS Choice Patient states their goals for this hospitalization and ongoing recovery are:: to go home CMS Medicare.gov Compare Post Acute Care list provided to:: Patient Choice offered to / list presented to : Patient  Discharge Placement                       Discharge Plan and Services                          HH Arranged: PT [facility] Agency: [place] Date [place]: 06/11/20 Time [place]: 1414 Representative spoke with at [place]: [place] ([place]) Interventions     Readmission Risk Interventions No flowsheet data found.

## 2020-06-11 NOTE — Discharge Instructions (Signed)
Please have a BMP checked on 06/13/20 with your primary care. Follow up with cardiology and nephrology at discharge as well.  Have a repeat TSH (thyroid test) in approximately 4 weeks

## 2020-06-11 NOTE — Progress Notes (Addendum)
Nephrology Follow-Up Consult note   Assessment/Recommendations: Patricia Mata is a/an 64 y.o. female with a past medical history significant for AS, pHTN, pAfib, pHTN, HTN, DM2, RAS, anemia, CHF  who present w/ heart failure exacerbation complicated by AKI  AKI on CKD 4: Bl Crt around 2.3-2.9 but with frequent fluctuations. AKI minimal and likely related to fluid shifts. Crt stable today and likely will slowly improve with time -Continue oral diuretics -Spironolactone had been used but holding for now -Stable for discharge from our standpoint, will arrange outpatient follow-up -Palliative care has been consulted given her multiple issues -Continue to monitor daily Cr, Dose meds for GFR -Monitor Daily I/Os, Daily weight  -Maintain MAP>65 for optimal renal perfusion.  -Avoid nephrotoxic medications including NSAIDs and Vanc/Zosyn combo -Renal ultrasound with no issues -Had an appt tomorrow w/ Dr. Carolin Sicks, will have it rescheduled for 1-2 weeks  Volume Status/CHF/pulmonary hypertension: Appears relatively euvolemic status post diuresis. Continue to monitor closely and continue diuretics. On sildenafil   Hypertension: Fairly extensive regimen including amlodipine 10 mg daily, carvedilol 12.5 mg twice daily, clonidine 0.1 mg twice daily, hydralazine 100 mg 3 times daily, doxazosin, and Lasix.  She has renal artery stenosis.  Recurrent volume overload particularly in the setting of hypertension could be a reason to consider intervention on the renal artery stenosis.  Defer to outpatient providers.  Targeting a lower blood pressure may be ideal as she may be afterload sensitive  Anemia: Multifactorial, recurrent and ongoing issue.  Hemoglobin improved to 8 at this time.  Check iron stores. May need ESA in the near future  Atrial fibrillation: On amiodarone.  Heart rate controlled at this time   Recommendations conveyed to primary service.    [facility] 06/11/2020 10:04 AM  ___________________________________________________________  CC: AKI on CKD  Interval History/Subjective: Patient feels excellent today.  Feels that she is urinating without any significant issue.  Minimal shortness of breath   Medications:  Current Facility-Administered Medications  Medication Dose Route Frequency Provider Last Rate Last Admin  . (feeding supplement) PROSource Plus liquid 30 mL  30 mL Oral BID BM Karmen Bongo, MD   30 mL at 06/11/20 0934  . acetaminophen (TYLENOL) tablet 650 mg  650 mg Oral Q6H PRN Karmen Bongo, MD       Or  . acetaminophen (TYLENOL) suppository 650 mg  650 mg Rectal Q6H PRN Karmen Bongo, MD      . allopurinol (ZYLOPRIM) tablet 100 mg  100 mg Oral Daily Pixie Casino, MD   100 mg at 06/11/20 0932  . amiodarone (PACERONE) tablet 200 mg  200 mg Oral Daily Karmen Bongo, MD   200 mg at 06/11/20 0932  . amLODipine (NORVASC) tablet 10 mg  10 mg Oral Daily Karmen Bongo, MD   10 mg at 06/11/20 0932  . atorvastatin (LIPITOR) tablet 40 mg  40 mg Oral Daily Karmen Bongo, MD   40 mg at 06/11/20 0933  . bisacodyl (DULCOLAX) EC tablet 5 mg  5 mg Oral Daily PRN Dwyane Dee, MD      . calcium carbonate (dosed in mg elemental calcium) suspension 500 mg of elemental calcium  500 mg of elemental calcium Oral Q6H PRN Karmen Bongo, MD      . camphor-menthol [facility]) lotion 1 application  1 application Topical B0F PRN Karmen Bongo, MD       And  . hydrOXYzine (ATARAX/VISTARIL) tablet 25 mg  25 mg Oral Q8H PRN Karmen Bongo, MD  25 mg at 06/10/20 2146  . carvedilol (COREG) tablet 12.5 mg  12.5 mg Oral BID WC Karmen Bongo, MD   12.5 mg at 06/11/20 0933  . cloNIDine (CATAPRES) tablet 0.1 mg  0.1 mg Oral BID Karmen Bongo, MD   0.1 mg at 06/11/20 0932  . docusate sodium (COLACE) capsule 100 mg  100 mg Oral BID Karmen Bongo, MD   100 mg at 06/11/20 0932  . docusate sodium (ENEMEEZ) enema 283 mg  1 enema  Rectal PRN Karmen Bongo, MD      . doxazosin (CARDURA) tablet 8 mg  8 mg Oral Daily Karmen Bongo, MD   8 mg at 06/11/20 0932  . feeding supplement (NEPRO CARB STEADY) liquid 237 mL  237 mL Oral TID PRN Karmen Bongo, MD      . furosemide (LASIX) tablet 40 mg  40 mg Oral Daily Chandrasekhar, Mahesh A, MD   40 mg at 06/11/20 0932  . hydrALAZINE (APRESOLINE) injection 5 mg  5 mg Intravenous Q4H PRN Karmen Bongo, MD      . hydrALAZINE (APRESOLINE) tablet 100 mg  100 mg Oral TID Karmen Bongo, MD   100 mg at 06/11/20 0932  . insulin aspart (novoLOG) injection 0-9 Units  0-9 Units Subcutaneous TID WC Karmen Bongo, MD   2 Units at 06/11/20 [phone]  . latanoprost (XALATAN) 0.005 % ophthalmic solution 1 drop  1 drop Both Eyes QHS Karmen Bongo, MD   1 drop at 06/10/20 2256  . levothyroxine (SYNTHROID) tablet 150 mcg  150 mcg Oral Daily Karmen Bongo, MD   150 mcg at 06/11/20 2353  . morphine 2 MG/ML injection 2 mg  2 mg Intravenous Q2H PRN Karmen Bongo, MD      . multivitamin with minerals tablet 1 tablet  1 tablet Oral Daily Karmen Bongo, MD   1 tablet at 06/11/20 0932  . ondansetron (ZOFRAN) tablet 4 mg  4 mg Oral Q6H PRN Karmen Bongo, MD       Or  . ondansetron [facility]) injection 4 mg  4 mg Intravenous Q6H PRN Karmen Bongo, MD      . oxyCODONE (Oxy IR/ROXICODONE) immediate release tablet 5 mg  5 mg Oral Q4H PRN Karmen Bongo, MD      . polyethylene glycol (MIRALAX / GLYCOLAX) packet 17 g  17 g Oral Daily PRN Karmen Bongo, MD      . sildenafil (REVATIO) tablet 20 mg  20 mg Oral TID Karmen Bongo, MD   20 mg at 06/11/20 0932  . sodium chloride flush (NS) 0.9 % injection 3 mL  3 mL Intravenous Q12H Karmen Bongo, MD   3 mL at 06/11/20 0934  . sorbitol 70 % solution 30 mL  30 mL Oral PRN Karmen Bongo, MD      . zolpidem Lorrin Mais) tablet 5 mg  5 mg Oral QHS PRN Karmen Bongo, MD   5 mg at 06/10/20 2146      Review of Systems: 10 systems reviewed and negative  except per interval history/subjective  Physical Exam: Vitals:   06/11/20 0535 06/11/20 0929  BP: (!) 145/45 (!) 150/49  Pulse: (!) 56 62  Resp: 18 18  Temp: 97.8 F (36.6 C) 98.2 F (36.8 C)  SpO2: 98% 95%   No intake/output data recorded.  Intake/Output Summary (Last 24 hours) at 06/11/2020 1004 Last data filed at 06/11/2020 0600 Gross per 24 hour  Intake 1003 ml  Output 1100 ml  Net -97 ml   Constitutional: well-appearing, no acute  distress ENMT: ears and nose without scars or lesions, MMM CV: normal rate, trace edema in lower ankles, systolic murmur present Respiratory: clear to auscultation, normal work of breathing Gastrointestinal: soft, non-tender, no palpable masses or hernias Skin: no visible lesions or rashes Psych: alert, judgement/insight appropriate, appropriate mood and affect   Test Results I personally reviewed new and old clinical labs and radiology tests Lab Results  Component Value Date   NA 136 06/11/2020   K 4.0 06/11/2020   CL 102 06/11/2020   CO2 21 (L) 06/11/2020   BUN 101 (H) 06/11/2020   CREATININE 3.42 (H) 06/11/2020   CALCIUM 9.0 06/11/2020   ALBUMIN 3.6 06/07/2020   PHOS 3.3 09/28/2019

## 2020-06-12 ENCOUNTER — Telehealth: Payer: Self-pay

## 2020-06-12 NOTE — Telephone Encounter (Signed)
Transition Care Management Follow-up Telephone Call  Date of discharge and from where: 06/11/2020  How have you been since you were released from the hospital? fine  Any questions or concerns? No   Items Reviewed:  Did the pt receive and understand the discharge instructions provided? yes  Medications obtained and verified? No  Any new allergies since your discharge? No  Dietary orders reviewed? yes  Do you have support at home? yes  Other (ie: DME, Home Health, etc) yes  Functional Questionnaire: (I = Independent and D = Dependent)  Bathing/Dressing- I   Meal Prep- I  Eating- I  Maintaining continence- I  Transferring/Ambulation- I  Managing Meds- I   Follow up appointments reviewed:    PCP Hospital f/u appt confirmed? Yes  Specialist Hospital f/u appt confirmed? Yes  Are transportation arrangements needed? Yes  If their condition worsens, is the pt aware to call  their PCP or go to the ED? Yes  Was the patient provided with contact information for the PCP's office or ED? Yes  Was the pt encouraged to call back with questions or concerns? Yes

## 2020-06-13 ENCOUNTER — Other Ambulatory Visit: Payer: Self-pay

## 2020-06-13 ENCOUNTER — Other Ambulatory Visit: Payer: Medicare Other

## 2020-06-13 DIAGNOSIS — E039 Hypothyroidism, unspecified: Secondary | ICD-10-CM

## 2020-06-14 ENCOUNTER — Telehealth (HOSPITAL_COMMUNITY): Payer: Self-pay | Admitting: *Deleted

## 2020-06-14 LAB — TSH: TSH: 11.3 u[IU]/mL — ABNORMAL HIGH (ref 0.450–4.500)

## 2020-06-14 LAB — T3, FREE: T3, Free: 1.6 pg/mL — ABNORMAL LOW (ref 2.0–4.4)

## 2020-06-14 LAB — T4: T4, Total: 7.6 ug/dL (ref 4.5–12.0)

## 2020-06-14 NOTE — Telephone Encounter (Signed)
Pt called stating her weight is up 5lbs over the last two days. Pt c/o fatigue but no swelling or shortness of breath. Pt wants to know if she needs to increase her diuretic pt states she is taking all medications as prescribed. Pt is concerned about increasing her diuretic due to her kidney function.  Routed to [place] for advice

## 2020-06-15 ENCOUNTER — Telehealth: Payer: Self-pay

## 2020-06-15 NOTE — Telephone Encounter (Signed)
Pt aware and said she has labs next week with her kidney doctor and will have results forwarded to our office.

## 2020-06-15 NOTE — Telephone Encounter (Signed)
Double diuretic for 2 days and reassess. BMET next week.
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
## 2020-06-19 IMAGING — DX DG CHEST 1V PORT
1 series · 1 of 1 positions shown · non-contrast
Comparison: 05/11/2018 chest radiograph

CLINICAL DATA: 62 y/o F; shortness of breath, hypothermia,
hypotension.

EXAM:
PORTABLE CHEST 1 VIEW

[chest]
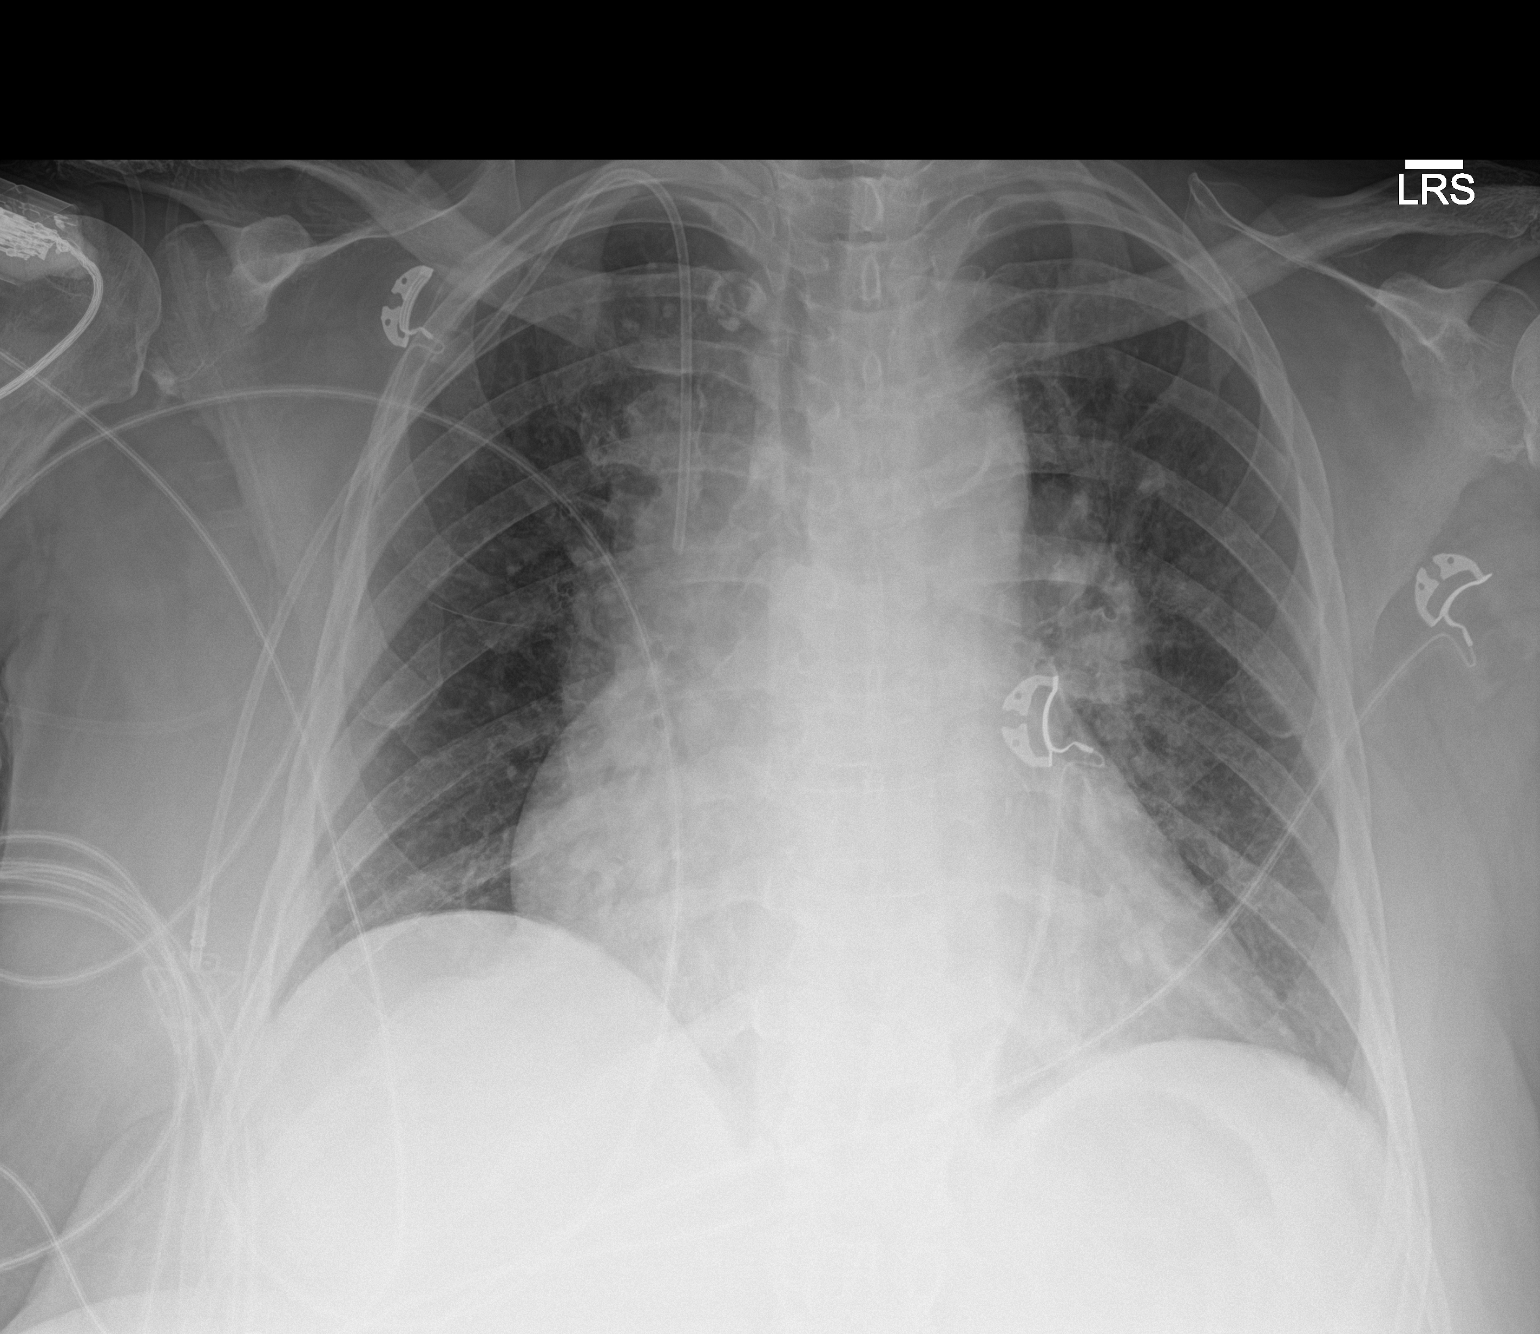

[1 of 1 positions shown; findings below may reference images not displayed]

FINDINGS: Stable cardiomegaly given projection and technique. Right central
venous catheter tip projects over mid SVC and is stable. Aortic
atherosclerosis with calcification. Ill-defined opacities within
left mid and lower lung zone. No pleural effusion or pneumothorax.
No acute osseous abnormality is evident.
IMPRESSION: Ill-defined opacities within left mid and lower lung zone may
represent pneumonia or asymmetric edema. Stable cardiomegaly.

By: Tang Littleton M.D.

## 2020-06-19 IMAGING — DX DG CHEST 1V PORT
1 series · 1 of 1 positions shown · non-contrast
Comparison: Portable chest x-ray of 05/12/2017 and 05/11/2017

CLINICAL DATA: New central line placement

EXAM:
PORTABLE CHEST 1 VIEW

[chest]
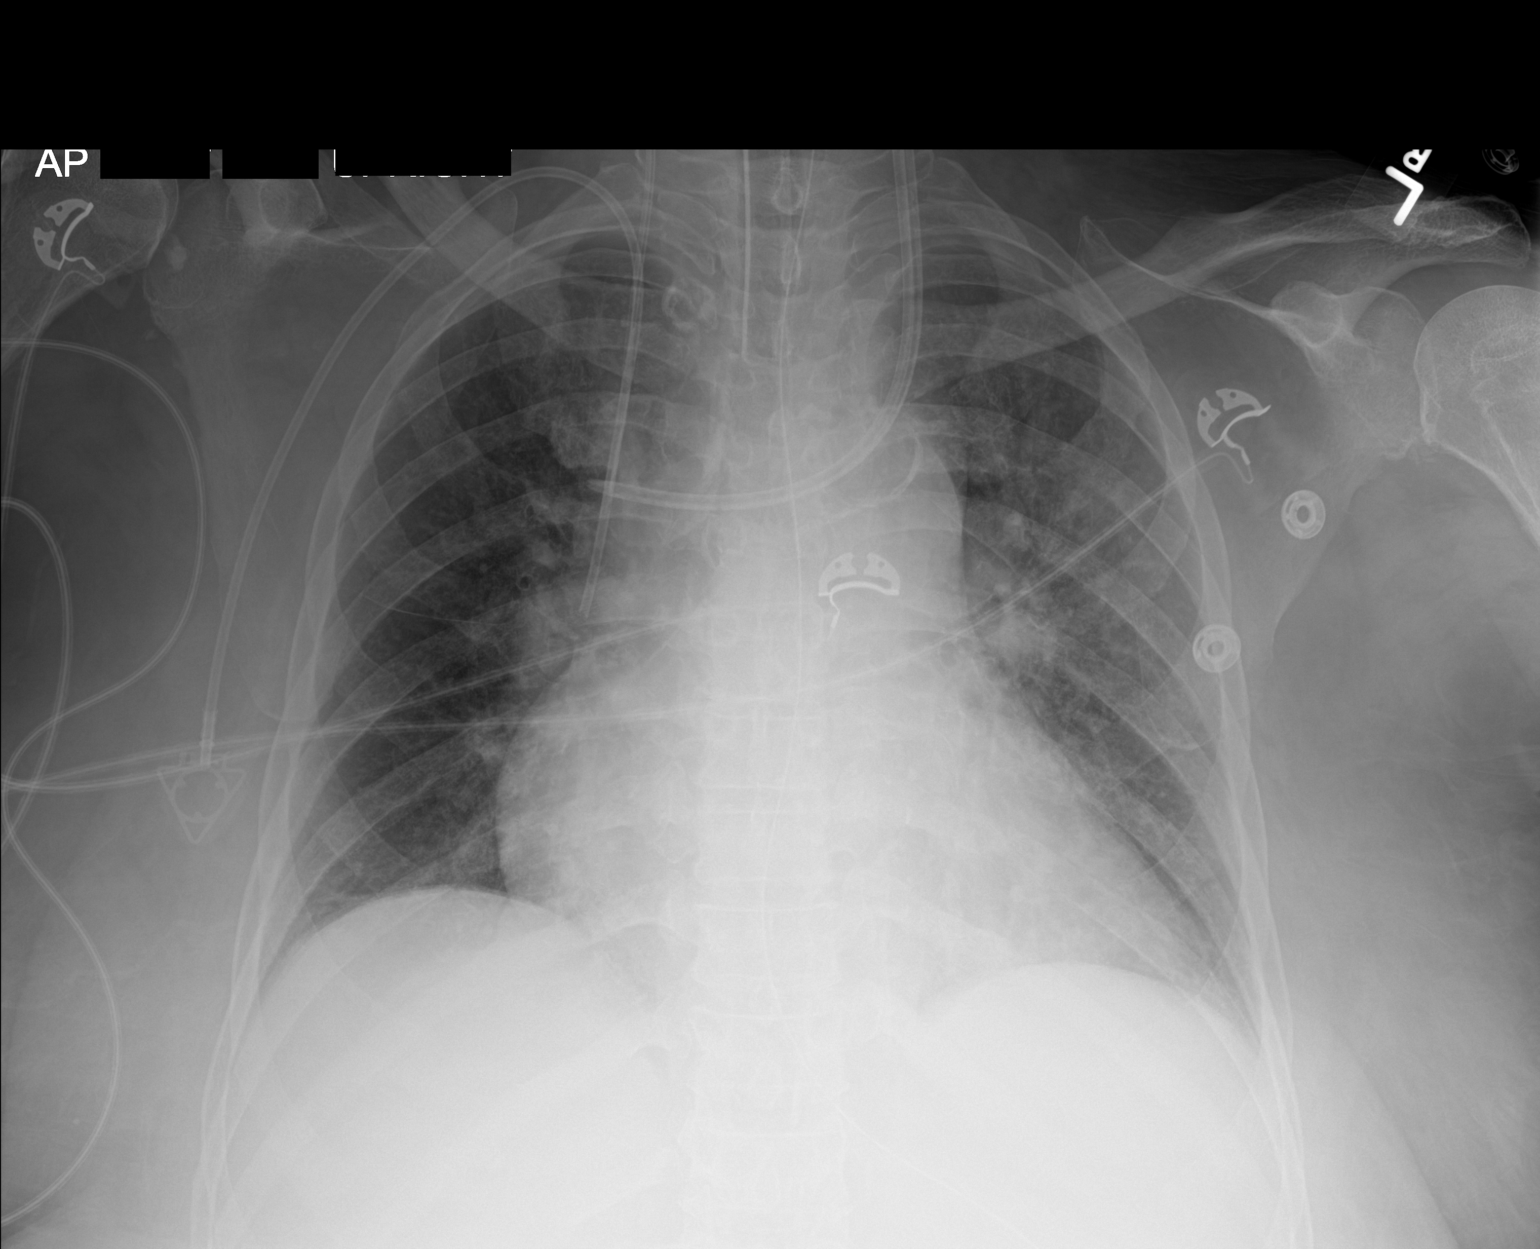

[1 of 1 positions shown; findings below may reference images not displayed]

FINDINGS: The tip of the endotracheal tube is approximately 4.1 cm above the
carina. Right Port-A-Cath and right IJ central venous line tips
overlie each other both within the mid lower SVC. A new left IJ
central venous line has been inserted with the tip near the lateral
wall of the mid SVC. No pneumothorax is seen. There is minimal
haziness in the left mid lung and follow-up is recommended.
Cardiomegaly is stable.
IMPRESSION: 1. New left IJ central venous line tip overlies the lateral wall of
the mid SVC. No pneumothorax.
2. Endotracheal tube tip 4.1 cm above the carina.
3. No change in right Port-A-Cath tip and right IJ central venous
line both of which are in the mid lower SVC.
4. Some haziness in the left mid lung. Recommend follow-up chest
x-ray.

## 2020-06-19 IMAGING — DX DG ABD PORTABLE 1V
1 series · 1 of 1 positions shown · non-contrast
Comparison: 05/08/2017 CT abdomen and pelvis

CLINICAL DATA: 62 y/o  F; orogastric tube placement.

EXAM:
PORTABLE ABDOMEN - 1 VIEW

[abdomen]
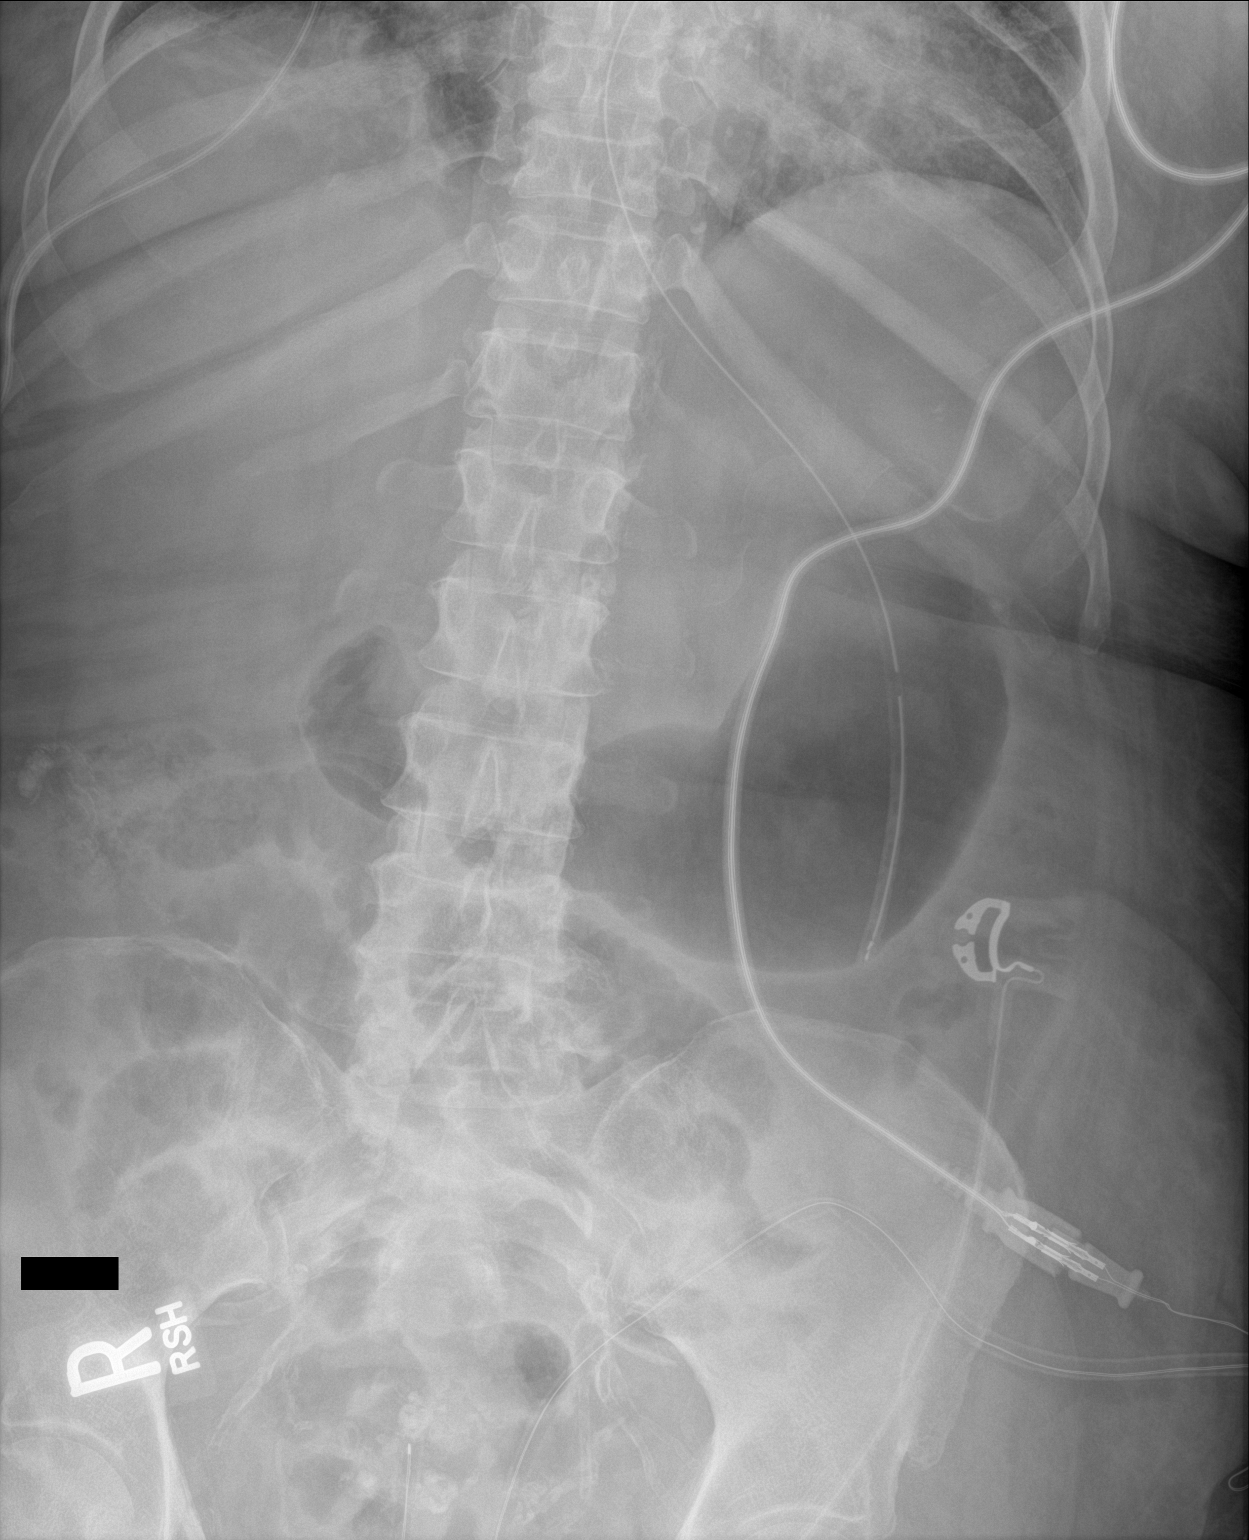

[1 of 1 positions shown; findings below may reference images not displayed]

FINDINGS: Enteric tube tip projects over gastric body. Normal bowel gas
pattern. No radiopaque urinary stone disease identified. Lower
lumbar spine degenerative changes.
IMPRESSION: Enteric tube tip projects over gastric body.

By: Antre Xman M.D.

## 2020-06-21 ENCOUNTER — Ambulatory Visit (INDEPENDENT_AMBULATORY_CARE_PROVIDER_SITE_OTHER): Payer: Medicare Other | Admitting: Pharmacist

## 2020-06-21 ENCOUNTER — Other Ambulatory Visit: Payer: Self-pay

## 2020-06-21 VITALS — BP 144/50 | HR 56 | Wt 223.0 lb

## 2020-06-21 DIAGNOSIS — I10 Essential (primary) hypertension: Secondary | ICD-10-CM

## 2020-06-21 DIAGNOSIS — I5031 Acute diastolic (congestive) heart failure: Secondary | ICD-10-CM | POA: Diagnosis not present

## 2020-06-21 NOTE — Progress Notes (Signed)
Patient ID: Patricia Mata                 DOB: 1955-12-25                      MRN: [id number]     HPI:  Patricia Mata is a 64 y.o. female referred by Dr. Oval Mata to HTN clinic. PMH includes aortic stenosis, pulmonary hypertension, atrial fibrillation, CKD- IV, hypertension, DM, gout, anemia,and prior colon cancer s/p hemicolectomy.Patient had a recent acute care admission for HF exacerbation and acute on chronic AKI.  Per nephrology consult while in-patient  "recurrent volume overload particularly in the setting of hypertension could be a reason to consider intervention on the renal artery stenosis".   Patient is feeling better , but reports recent weigh gain. She called Dr Patricia Mata office and was instructed to increase her furosemide for few days. Was supposed to repeat BMET 1 week after diuresis, but no repeat BMET done yet. She is very pleased about not having BP in 200s and 180s anymore. Noted she is ale to use Viv[employer] app now and started to record BP readings on Nov/30.   Current HTN meds:  Amlodipine 867m daily Carvedilol 12.542mtwice daily Clonidine 0.67m55mwice daily Doxazosin 8mg19mily Furosemide 40mg23mly Hydralazine 100mg 16m Previously tried:   BP goal: <130/80 (140/90) if patient unable to tolerate more aggressive BP lowering regimen.  Family History: mother died at 82 fro48massive MI, was healthy until then; father probable MI about 45 yea52 ago, was probably in his late 40's; 46'sst sister with hypertension, another with hypertension; (4 sis, 3 bro); 1 son (firemArmed forces logistics/support/administrative officer some valve issues (pt unsure) since childhood  Social History: no tobacco, 2 mixed drinks per night; drinks decaf coffee  Diet: mostly home cooked; rarely fries food; plenty of vegetables, fewer fruits  Exercise: loves exercise class with Patricia Mata; using exercise bike at home occasonally; wants to continue, ordered DVD - aerobics for seniors   [place] BP readings:  [place]  138[place]57; HR range 53-55bpm 10 readings on record from home (Nov/29 to Dec/8); average 136/56  Wt Readings from Last 3 Encounters:  06/21/20 223 lb (101.2 kg)  06/11/20 218 lb 3.2 oz (99 kg)  05/22/20 218 lb (98.9 kg)   BP Readings from Last 3 Encounters:  06/21/20 (!) 144/50  06/11/20 (!) 147/45  05/22/20 (!) 162/54   Pulse Readings from Last 3 Encounters:  06/21/20 (!) 56  06/11/20 64  05/22/20 76    Renal function: Estimated Creatinine Clearance: 15.1 mL/min (A) (by C-G formula based on SCr of 4.3 mg/dL (H)).  Past Medical History:  Diagnosis Date  . Acute renal failure (ARF) (HCC) [place]  . Cancer (HCC) [facility]   Colon   . Chronic diastolic (congestive) heart failure (HCC)  [place] [facility]  . Hypertension   . Macrocytic anemia   . MDS (myelodysplastic syndrome) (HCC)  [place]  [place] to [place]  Medication Sig Dispense Refill  . allopurinol (ZYLOPRIM) 100 MG tablet Take 1 tablet (100 mg total) by mouth daily. 30 tablet 3  . amiodarone (PACERONE) 200 MG tablet Take 1 tablet (200 mg total) by mouth daily. 90 tablet 1  . amLODipine (NORVASC) 10 MG tablet Take 1 tablet (10 mg total) by mouth daily. 90 tablet 3  .  atorvastatin (LIPITOR) 40 MG tablet Take 1 tablet (40 mg total) by mouth daily. 90 tablet 1  . blood glucose meter kit and supplies Dispense based on patient and insurance preference. Use up to four times daily as directed. (FOR ICD-10 E10.9, E11.9). (Patient taking differently: 1 each by Other route See admin instructions. Dispense based on patient and insurance preference. Use up to four times daily as directed. (FOR ICD-10 E10.9, E11.9).) 1 each 3  . carvedilol (COREG) 12.5 MG tablet Take 1 tablet (12.5 mg total) by mouth 2 (two) times daily. 180 tablet 3  . cloNIDine (CATAPRES) 0.1 MG tablet Take 1 tablet (0.1 mg total) by mouth 2 (two)  times daily. 60 tablet 6  . colchicine 0.6 MG tablet Take 0.6 mg by mouth as directed. Take 1/2 tablet twice a day as needed for gout    . doxazosin (CARDURA) 8 MG tablet Take 1 tablet (8 mg total) by mouth daily. 30 tablet 6  . furosemide (LASIX) 40 MG tablet Take 1 tablet by mouth once daily (Patient taking differently: Take 40 mg by mouth daily. ) 90 tablet 0  . glucose blood test strip check blood sugars twice daily Dx code E11.22 (Patient taking differently: 1 each by Other route See admin instructions. check blood sugars twice daily Dx code E11.22) 100 each 3  . hydrALAZINE (APRESOLINE) 100 MG tablet Take 1 tablet (100 mg total) by mouth 3 (three) times daily. 90 tablet 3  . Insulin Glargine (BASAGLAR KWIKPEN) 100 UNIT/ML Inject 0.14 mLs (14 Units total) into the skin daily. (Patient taking differently: Inject 18 Units into the skin daily. ) 5 pen 1  . Blood Glucose Monitoring Suppl (ONETOUCH ULTRALINK) w/Device KIT Check blood sugars twice daily E11.9 (Patient taking differently: 1 each by Other route See admin instructions. Check blood sugars twice daily E11.9) 1 kit 0  . fluticasone (FLONASE) 50 MCG/ACT nasal spray Place 1 spray into both nostrils daily. (Patient taking differently: Place 1 spray into both nostrils daily as needed for allergies. ) 16 g 2  . Insulin Pen Needle 29G X 5MM MISC Use as directed (Patient taking differently: 1 each by Other route See admin instructions. Use as directed) 200 each 0  . Lancets (ONETOUCH DELICA PLUS [id number]) MISC check blood sugars twice daily Dx code: E11.22 (Patient taking differently: 1 each by Other route See admin instructions. check blood sugars twice daily Dx code: E11.22) 100 each 3  . latanoprost (XALATAN) 0.005 % ophthalmic solution Place 1 drop into both eyes at bedtime.     Marland Kitchen levothyroxine (SYNTHROID) 125 MCG tablet Take 1 tablet (125 mcg total) by mouth daily. 30 tablet 6  . linagliptin (TRADJENTA) 5 MG TABS tablet Take 1 tablet (5 mg  total) by mouth daily. 90 tablet 1  . Multiple Vitamins-Minerals (CENTRUM SILVER 50+WOMEN) TABS Take 1 tablet by mouth daily.    Marland Kitchen oxymetazoline (AFRIN) 0.05 % nasal spray Place 1 spray into both nostrils 2 (two) times daily as needed for congestion.    . sildenafil (REVATIO) 20 MG tablet TAKE 1 TABLET BY MOUTH THREE TIMES DAILY (Patient taking differently: Take 20 mg by mouth 3 (three) times daily. ) 90 tablet 0   No current facility-administered medications on file prior to visit.    Allergies  Allergen Reactions  . Ancef [Cefazolin] Itching    Severe itching- after procedure, ancef was the antibiotic.-04/02/17 Tolerates penicillins  . Lactose Intolerance (Gi) Diarrhea  . Sulfa Antibiotics Rash and Other (See Comments)  Blisters, also    Blood pressure (!) 144/50, pulse (!) 56, weight 223 lb (101.2 kg).  Essential hypertension Blood pressure remain stable around 140/90, with average home BP of 136/56. Noted worsen renal function under nephrologist assessment, and recent admission for HF exacerbation.  Will continue current BP regimen as written, repeat BMET today, and continue home BP monitoring with Vivify. Patient understands importance of low sodium and low sugar diet. Plan to schedule f/u visit with HF clinic ASAP. Sees PCP in 1 week.    Keyerra Lamere Rodriguez-Guzman PharmD, BCPS, [place] [address] 06/26/2020 10:15 AM

## 2020-06-21 NOTE — Patient Instructions (Addendum)
Return for a  follow up appointment in ASAP with Dr Sung Amabile  Call office at [phone] to schedule f/u appointment (due November)  Go to the lab in [place] your blood pressure at home daily (if able) and keep record of the readings.  Take your BP meds as follows: *NO MEDICATION CHANGE*  Bring all of your meds, your BP cuff and your record of home blood pressures to your next appointment.  Exercise as you're able, try to walk approximately 30 minutes per day.  Keep salt intake to a minimum, especially watch canned and prepared boxed foods.  Eat more fresh fruits and vegetables and fewer canned items.  Avoid eating in fast food restaurants.    HOW TO TAKE YOUR BLOOD PRESSURE: . Rest 5 minutes before taking your blood pressure. .  Don't smoke or drink caffeinated beverages for at least 30 minutes before. . Take your blood pressure before (not after) you eat. . Sit comfortably with your back supported and both feet on the floor (don't cross your legs). . Elevate your arm to heart level on a table or a desk. . Use the proper sized cuff. It should fit smoothly and snugly around your bare upper arm. There should be enough room to slip a fingertip under the cuff. The bottom edge of the cuff should be 1 inch above the crease of the elbow. . Ideally, take 3 measurements at one sitting and record the average.

## 2020-06-22 LAB — BASIC METABOLIC PANEL
BUN/Creatinine Ratio: 23 (ref 12–28)
BUN: 100 mg/dL (ref 8–27)
CO2: 16 mmol/L — ABNORMAL LOW (ref 20–29)
Calcium: 9 mg/dL (ref 8.7–10.3)
Chloride: 99 mmol/L (ref 96–106)
Creatinine, Ser: 4.3 mg/dL — ABNORMAL HIGH (ref 0.57–1.00)
GFR calc Af Amer: 12 mL/min/{1.73_m2} — ABNORMAL LOW (ref >59)
GFR calc non Af Amer: 10 mL/min/{1.73_m2} — ABNORMAL LOW (ref >59)
Glucose: 182 mg/dL — ABNORMAL HIGH (ref 65–99)
Potassium: 5.4 mmol/L — ABNORMAL HIGH (ref 3.5–5.2)
Sodium: 132 mmol/L — ABNORMAL LOW (ref 134–144)

## 2020-06-23 ENCOUNTER — Telehealth (HOSPITAL_COMMUNITY): Payer: Self-pay | Admitting: Cardiology

## 2020-06-23 MED ORDER — LOKELMA 10 G PO PACK: 10.0000 g | PACK | Freq: Once | ORAL | 0 refills | Status: AC

## 2020-06-23 NOTE — Telephone Encounter (Signed)
-----   Message from Jolaine Artist, MD sent at 06/22/2020  5:27 PM EST ----- Need one dose of Lokelma 10. Needs f/u with Renal ASAP.

## 2020-06-23 NOTE — Telephone Encounter (Signed)
Pt aware and voiced understanding Script sent to [facility]  Reports she has a follow up with renal 12/13

## 2020-06-26 ENCOUNTER — Encounter: Payer: Self-pay | Admitting: Pharmacist

## 2020-06-26 ENCOUNTER — Ambulatory Visit: Payer: BC Managed Care – PPO | Admitting: Emergency Medicine

## 2020-06-26 NOTE — Assessment & Plan Note (Signed)
Blood pressure remain stable around 140/90, with average home BP of 136/56. Noted worsen renal function under nephrologist assessment, and recent admission for HF exacerbation.  Will continue current BP regimen as written, repeat BMET today, and continue home BP monitoring with Vivify. Patient understands importance of low sodium and low sugar diet. Plan to schedule f/u visit with HF clinic ASAP. Sees PCP in 1 week.

## 2020-06-27 ENCOUNTER — Encounter: Payer: BC Managed Care – PPO | Admitting: Nurse Practitioner

## 2020-06-27 ENCOUNTER — Encounter: Payer: Medicare Other | Admitting: Nurse Practitioner

## 2020-06-29 ENCOUNTER — Telehealth: Payer: Self-pay

## 2020-06-29 ENCOUNTER — Other Ambulatory Visit: Payer: Self-pay | Admitting: *Deleted

## 2020-06-29 ENCOUNTER — Other Ambulatory Visit: Payer: Self-pay

## 2020-06-29 ENCOUNTER — Inpatient Hospital Stay: Payer: Medicare Other | Attending: Oncology

## 2020-06-29 ENCOUNTER — Inpatient Hospital Stay: Payer: Medicare Other

## 2020-06-29 VITALS — BP 148/43 | HR 50 | Temp 98.6°F | Resp 17

## 2020-06-29 DIAGNOSIS — D469 Myelodysplastic syndrome, unspecified: Secondary | ICD-10-CM | POA: Insufficient documentation

## 2020-06-29 DIAGNOSIS — D649 Anemia, unspecified: Secondary | ICD-10-CM

## 2020-06-29 LAB — CMP (CANCER CENTER ONLY)
ALT: 24 U/L (ref 0–44)
AST: 42 U/L — ABNORMAL HIGH (ref 15–41)
Albumin: 3.6 g/dL (ref 3.5–5.0)
Alkaline Phosphatase: 85 U/L (ref 38–126)
Anion gap: 9 (ref 5–15)
BUN: 112 mg/dL — ABNORMAL HIGH (ref 8–23)
CO2: 20 mmol/L — ABNORMAL LOW (ref 22–32)
Calcium: 9.2 mg/dL (ref 8.9–10.3)
Chloride: 106 mmol/L (ref 98–111)
Creatinine: 5.42 mg/dL (ref 0.44–1.00)
GFR, Estimated: 8 mL/min — ABNORMAL LOW (ref >60)
Glucose, Bld: 111 mg/dL — ABNORMAL HIGH (ref 70–99)
Potassium: 4.5 mmol/L (ref 3.5–5.1)
Sodium: 135 mmol/L (ref 135–145)
Total Bilirubin: 0.7 mg/dL (ref 0.3–1.2)
Total Protein: 6.9 g/dL (ref 6.5–8.1)

## 2020-06-29 LAB — CBC WITH DIFFERENTIAL (CANCER CENTER ONLY)
Abs Immature Granulocytes: 0.05 10*3/uL (ref 0.00–0.07)
Basophils Absolute: 0 10*3/uL (ref 0.0–0.1)
Basophils Relative: 0 %
Eosinophils Absolute: 0.1 10*3/uL (ref 0.0–0.5)
Eosinophils Relative: 1 %
HCT: 20.7 % — ABNORMAL LOW (ref 36.0–46.0)
Hemoglobin: 7.1 g/dL — ABNORMAL LOW (ref 12.0–15.0)
Immature Granulocytes: 1 %
Lymphocytes Relative: 9 %
Lymphs Abs: 0.5 10*3/uL — ABNORMAL LOW (ref 0.7–4.0)
MCH: 31.6 pg (ref 26.0–34.0)
MCHC: 34.3 g/dL (ref 30.0–36.0)
MCV: 92 fL (ref 80.0–100.0)
Monocytes Absolute: 0.8 10*3/uL (ref 0.1–1.0)
Monocytes Relative: 16 %
Neutro Abs: 3.8 10*3/uL (ref 1.7–7.7)
Neutrophils Relative %: 73 %
Platelet Count: 154 10*3/uL (ref 150–400)
RBC: 2.25 MIL/uL — ABNORMAL LOW (ref 3.87–5.11)
RDW: 21.9 % — ABNORMAL HIGH (ref 11.5–15.5)
WBC Count: 5.2 10*3/uL (ref 4.0–10.5)
nRBC: 2.5 % — ABNORMAL HIGH (ref 0.0–0.2)

## 2020-06-29 LAB — PREPARE RBC (CROSSMATCH)

## 2020-06-29 LAB — SAMPLE TO BLOOD BANK

## 2020-06-29 MED ORDER — DIPHENHYDRAMINE HCL 25 MG PO CAPS
ORAL_CAPSULE | ORAL | Status: AC
  Filled 2020-06-29: qty 1

## 2020-06-29 MED ORDER — ACETAMINOPHEN 325 MG PO TABS
650.0000 mg | ORAL_TABLET | Freq: Once | ORAL | Status: AC
  Administered 2020-06-29: 650 mg via ORAL

## 2020-06-29 MED ORDER — SODIUM CHLORIDE 0.9% IV SOLUTION
250.0000 mL | Freq: Once | INTRAVENOUS | Status: AC
  Administered 2020-06-29: 250 mL via INTRAVENOUS
  Filled 2020-06-29: qty 250

## 2020-06-29 MED ORDER — ACETAMINOPHEN 325 MG PO TABS
ORAL_TABLET | ORAL | Status: AC
  Filled 2020-06-29: qty 2

## 2020-06-29 MED ORDER — FUROSEMIDE 10 MG/ML IJ SOLN
20.0000 mg | Freq: Two times a day (BID) | INTRAMUSCULAR | Status: DC | PRN
  Administered 2020-06-29 (×2): 20 mg via INTRAVENOUS

## 2020-06-29 MED ORDER — LUSPATERCEPT-AAMT 75 MG ~~LOC~~ SOLR
1.3300 mg/kg | Freq: Once | SUBCUTANEOUS | Status: AC
  Administered 2020-06-29: 115 mg via SUBCUTANEOUS
  Filled 2020-06-29: qty 2.3

## 2020-06-29 MED ORDER — FUROSEMIDE 10 MG/ML IJ SOLN
INTRAMUSCULAR | Status: AC
  Filled 2020-06-29: qty 4

## 2020-06-29 MED ORDER — DIPHENHYDRAMINE HCL 25 MG PO CAPS
25.0000 mg | ORAL_CAPSULE | Freq: Once | ORAL | Status: AC
  Administered 2020-06-29: 25 mg via ORAL

## 2020-06-29 NOTE — Progress Notes (Signed)
Ok to proceed with Reblozyl injection with hgb 7.1 and scr 5.41.

## 2020-06-29 NOTE — Patient Instructions (Signed)

## 2020-06-29 NOTE — Telephone Encounter (Signed)
CRITICAL VALUE STICKER  CRITICAL VALUE: Creatinine = 5.42  RECEIVER (on-site recipient of call): Yetta Glassman, CMA  DATE & TIME NOTIFIED: 06/29/20 at 11:35am  MESSENGER (representative from lab): Rosann Auerbach  MD NOTIFIED: Dr. Alen Blew  TIME OF NOTIFICATION: 06/29/20 at 11: 38am  RESPONSE: Notification given to Carlyon Prows, RN for follow-up with provider.

## 2020-06-30 LAB — TYPE AND SCREEN
ABO/RH(D): A NEG
Antibody Screen: NEGATIVE
Unit division: 0
Unit division: 0

## 2020-06-30 LAB — BPAM RBC
Blood Product Expiration Date: 202201042359
Blood Product Expiration Date: 202201042359
ISSUE DATE / TIME: 202112161242
ISSUE DATE / TIME: 202112161242
Unit Type and Rh: 600
Unit Type and Rh: 600

## 2020-07-02 ENCOUNTER — Other Ambulatory Visit (HOSPITAL_COMMUNITY): Payer: Self-pay | Admitting: Internal Medicine

## 2020-07-03 ENCOUNTER — Other Ambulatory Visit (HOSPITAL_COMMUNITY): Payer: Self-pay

## 2020-07-03 MED ORDER — CLONIDINE HCL 0.1 MG PO TABS: 0.1000 mg | ORAL_TABLET | Freq: Two times a day (BID) | ORAL | 11 refills | Status: DC

## 2020-07-06 ENCOUNTER — Emergency Department (HOSPITAL_COMMUNITY): Payer: Medicare Other

## 2020-07-06 ENCOUNTER — Encounter (HOSPITAL_COMMUNITY): Payer: Self-pay | Admitting: Emergency Medicine

## 2020-07-06 ENCOUNTER — Inpatient Hospital Stay (HOSPITAL_COMMUNITY)
Admission: EM | Admit: 2020-07-06 | Discharge: 2020-07-09 | DRG: 682 | Disposition: A | Discharge status: Home / Self Care | Payer: Medicare Other | Attending: Internal Medicine | Admitting: Internal Medicine

## 2020-07-06 ENCOUNTER — Inpatient Hospital Stay (HOSPITAL_COMMUNITY): Payer: Medicare Other

## 2020-07-06 ENCOUNTER — Other Ambulatory Visit: Payer: Self-pay

## 2020-07-06 DIAGNOSIS — M109 Gout, unspecified: Secondary | ICD-10-CM | POA: Diagnosis present

## 2020-07-06 DIAGNOSIS — Z87891 Personal history of nicotine dependence: Secondary | ICD-10-CM | POA: Diagnosis not present

## 2020-07-06 DIAGNOSIS — N179 Acute kidney failure, unspecified: Principal | ICD-10-CM | POA: Diagnosis present

## 2020-07-06 DIAGNOSIS — E871 Hypo-osmolality and hyponatremia: Secondary | ICD-10-CM | POA: Diagnosis present

## 2020-07-06 DIAGNOSIS — Z79890 Hormone replacement therapy (postmenopausal): Secondary | ICD-10-CM | POA: Diagnosis not present

## 2020-07-06 DIAGNOSIS — I130 Hypertensive heart and chronic kidney disease with heart failure and stage 1 through stage 4 chronic kidney disease, or unspecified chronic kidney disease: Secondary | ICD-10-CM | POA: Diagnosis present

## 2020-07-06 DIAGNOSIS — Z881 Allergy status to other antibiotic agents status: Secondary | ICD-10-CM

## 2020-07-06 DIAGNOSIS — I10 Essential (primary) hypertension: Secondary | ICD-10-CM | POA: Diagnosis present

## 2020-07-06 DIAGNOSIS — I480 Paroxysmal atrial fibrillation: Secondary | ICD-10-CM | POA: Diagnosis present

## 2020-07-06 DIAGNOSIS — D469 Myelodysplastic syndrome, unspecified: Secondary | ICD-10-CM | POA: Diagnosis present

## 2020-07-06 DIAGNOSIS — E785 Hyperlipidemia, unspecified: Secondary | ICD-10-CM | POA: Diagnosis present

## 2020-07-06 DIAGNOSIS — E1122 Type 2 diabetes mellitus with diabetic chronic kidney disease: Secondary | ICD-10-CM | POA: Diagnosis present

## 2020-07-06 DIAGNOSIS — Z85038 Personal history of other malignant neoplasm of large intestine: Secondary | ICD-10-CM

## 2020-07-06 DIAGNOSIS — I701 Atherosclerosis of renal artery: Secondary | ICD-10-CM | POA: Diagnosis present

## 2020-07-06 DIAGNOSIS — I080 Rheumatic disorders of both mitral and aortic valves: Secondary | ICD-10-CM | POA: Diagnosis present

## 2020-07-06 DIAGNOSIS — E039 Hypothyroidism, unspecified: Secondary | ICD-10-CM | POA: Diagnosis present

## 2020-07-06 DIAGNOSIS — Z20822 Contact with and (suspected) exposure to covid-19: Secondary | ICD-10-CM | POA: Diagnosis present

## 2020-07-06 DIAGNOSIS — Z79899 Other long term (current) drug therapy: Secondary | ICD-10-CM

## 2020-07-06 DIAGNOSIS — I6529 Occlusion and stenosis of unspecified carotid artery: Secondary | ICD-10-CM | POA: Diagnosis present

## 2020-07-06 DIAGNOSIS — Z882 Allergy status to sulfonamides status: Secondary | ICD-10-CM

## 2020-07-06 DIAGNOSIS — D696 Thrombocytopenia, unspecified: Secondary | ICD-10-CM | POA: Diagnosis present

## 2020-07-06 DIAGNOSIS — I272 Pulmonary hypertension, unspecified: Secondary | ICD-10-CM | POA: Diagnosis not present

## 2020-07-06 DIAGNOSIS — Z794 Long term (current) use of insulin: Secondary | ICD-10-CM

## 2020-07-06 DIAGNOSIS — I2721 Secondary pulmonary arterial hypertension: Secondary | ICD-10-CM | POA: Diagnosis present

## 2020-07-06 DIAGNOSIS — N184 Chronic kidney disease, stage 4 (severe): Secondary | ICD-10-CM | POA: Diagnosis present

## 2020-07-06 DIAGNOSIS — N189 Chronic kidney disease, unspecified: Secondary | ICD-10-CM

## 2020-07-06 DIAGNOSIS — E119 Type 2 diabetes mellitus without complications: Secondary | ICD-10-CM | POA: Diagnosis not present

## 2020-07-06 DIAGNOSIS — Z8249 Family history of ischemic heart disease and other diseases of the circulatory system: Secondary | ICD-10-CM

## 2020-07-06 DIAGNOSIS — I5033 Acute on chronic diastolic (congestive) heart failure: Secondary | ICD-10-CM | POA: Diagnosis present

## 2020-07-06 DIAGNOSIS — E038 Other specified hypothyroidism: Secondary | ICD-10-CM | POA: Diagnosis not present

## 2020-07-06 DIAGNOSIS — Z833 Family history of diabetes mellitus: Secondary | ICD-10-CM

## 2020-07-06 LAB — URINALYSIS, ROUTINE W REFLEX MICROSCOPIC
Bilirubin Urine: NEGATIVE
Glucose, UA: NEGATIVE mg/dL
Hgb urine dipstick: NEGATIVE
Ketones, ur: NEGATIVE mg/dL
Nitrite: NEGATIVE
Protein, ur: 100 mg/dL — AB
Specific Gravity, Urine: 1.008 (ref 1.005–1.030)
pH: 5 (ref 5.0–8.0)

## 2020-07-06 LAB — COMPREHENSIVE METABOLIC PANEL
ALT: 16 U/L (ref 0–44)
AST: 28 U/L (ref 15–41)
Albumin: 3.9 g/dL (ref 3.5–5.0)
Alkaline Phosphatase: 64 U/L (ref 38–126)
Anion gap: 17 — ABNORMAL HIGH (ref 5–15)
BUN: 134 mg/dL — ABNORMAL HIGH (ref 8–23)
CO2: 15 mmol/L — ABNORMAL LOW (ref 22–32)
Calcium: 9.2 mg/dL (ref 8.9–10.3)
Chloride: 98 mmol/L (ref 98–111)
Creatinine, Ser: 6.45 mg/dL — ABNORMAL HIGH (ref 0.44–1.00)
GFR, Estimated: 7 mL/min — ABNORMAL LOW (ref >60)
Glucose, Bld: 100 mg/dL — ABNORMAL HIGH (ref 70–99)
Potassium: 4.8 mmol/L (ref 3.5–5.1)
Sodium: 130 mmol/L — ABNORMAL LOW (ref 135–145)
Total Bilirubin: 1.2 mg/dL (ref 0.3–1.2)
Total Protein: 7.2 g/dL (ref 6.5–8.1)

## 2020-07-06 LAB — CBC WITH DIFFERENTIAL/PLATELET
Abs Immature Granulocytes: 0.03 10*3/uL (ref 0.00–0.07)
Basophils Absolute: 0 10*3/uL (ref 0.0–0.1)
Basophils Relative: 0 %
Eosinophils Absolute: 0 10*3/uL (ref 0.0–0.5)
Eosinophils Relative: 1 %
HCT: 25.5 % — ABNORMAL LOW (ref 36.0–46.0)
Hemoglobin: 8.8 g/dL — ABNORMAL LOW (ref 12.0–15.0)
Immature Granulocytes: 1 %
Lymphocytes Relative: 9 %
Lymphs Abs: 0.4 10*3/uL — ABNORMAL LOW (ref 0.7–4.0)
MCH: 31.8 pg (ref 26.0–34.0)
MCHC: 34.5 g/dL (ref 30.0–36.0)
MCV: 92.1 fL (ref 80.0–100.0)
Monocytes Absolute: 0.8 10*3/uL (ref 0.1–1.0)
Monocytes Relative: 18 %
Neutro Abs: 3.1 10*3/uL (ref 1.7–7.7)
Neutrophils Relative %: 71 %
Platelets: 100 10*3/uL — ABNORMAL LOW (ref 150–400)
RBC: 2.77 MIL/uL — ABNORMAL LOW (ref 3.87–5.11)
RDW: 19.7 % — ABNORMAL HIGH (ref 11.5–15.5)
WBC: 4.3 10*3/uL (ref 4.0–10.5)
nRBC: 2.8 % — ABNORMAL HIGH (ref 0.0–0.2)

## 2020-07-06 LAB — BRAIN NATRIURETIC PEPTIDE: B Natriuretic Peptide: 932.6 pg/mL — ABNORMAL HIGH (ref 0.0–100.0)

## 2020-07-06 LAB — RESP PANEL BY RT-PCR (FLU A&B, COVID) ARPGX2
Influenza A by PCR: NEGATIVE
Influenza B by PCR: NEGATIVE
SARS Coronavirus 2 by RT PCR: NEGATIVE

## 2020-07-06 LAB — GLUCOSE, CAPILLARY
Glucose-Capillary: 66 mg/dL — ABNORMAL LOW (ref 70–99)
Glucose-Capillary: 93 mg/dL (ref 70–99)

## 2020-07-06 MED ORDER — LEVOTHYROXINE SODIUM 25 MCG PO TABS
125.0000 ug | ORAL_TABLET | Freq: Every day | ORAL | Status: DC
  Administered 2020-07-07 – 2020-07-09 (×3): 125 ug via ORAL
  Filled 2020-07-06 (×3): qty 1

## 2020-07-06 MED ORDER — ACETAMINOPHEN 650 MG RE SUPP: 650.0000 mg | Freq: Four times a day (QID) | RECTAL | Status: DC | PRN

## 2020-07-06 MED ORDER — INSULIN ASPART 100 UNIT/ML ~~LOC~~ SOLN
0.0000 [IU] | Freq: Three times a day (TID) | SUBCUTANEOUS | Status: DC
  Administered 2020-07-07: 1 [IU] via SUBCUTANEOUS
  Administered 2020-07-07 – 2020-07-08 (×2): 2 [IU] via SUBCUTANEOUS
  Administered 2020-07-08: 3 [IU] via SUBCUTANEOUS
  Administered 2020-07-08: 1 [IU] via SUBCUTANEOUS
  Administered 2020-07-09 (×2): 2 [IU] via SUBCUTANEOUS
  Filled 2020-07-06: qty 0.09

## 2020-07-06 MED ORDER — CLONIDINE HCL 0.1 MG PO TABS
0.1000 mg | ORAL_TABLET | Freq: Two times a day (BID) | ORAL | Status: DC
  Administered 2020-07-06 – 2020-07-09 (×6): 0.1 mg via ORAL
  Filled 2020-07-06 (×6): qty 1

## 2020-07-06 MED ORDER — AMLODIPINE BESYLATE 5 MG PO TABS
10.0000 mg | ORAL_TABLET | Freq: Every day | ORAL | Status: DC
  Administered 2020-07-07 – 2020-07-08 (×2): 10 mg via ORAL
  Filled 2020-07-06 (×2): qty 2

## 2020-07-06 MED ORDER — HYDRALAZINE HCL 50 MG PO TABS
100.0000 mg | ORAL_TABLET | Freq: Three times a day (TID) | ORAL | Status: DC
  Administered 2020-07-06 – 2020-07-09 (×8): 100 mg via ORAL
  Filled 2020-07-06 (×8): qty 2

## 2020-07-06 MED ORDER — DIPHENHYDRAMINE HCL 50 MG/ML IJ SOLN
12.5000 mg | Freq: Once | INTRAMUSCULAR | Status: AC
  Administered 2020-07-06: 12.5 mg via INTRAVENOUS
  Filled 2020-07-06: qty 1

## 2020-07-06 MED ORDER — CARVEDILOL 12.5 MG PO TABS
12.5000 mg | ORAL_TABLET | Freq: Two times a day (BID) | ORAL | Status: DC
  Administered 2020-07-07 – 2020-07-09 (×5): 12.5 mg via ORAL
  Filled 2020-07-06 (×6): qty 1

## 2020-07-06 MED ORDER — ONDANSETRON HCL 4 MG PO TABS: 4.0000 mg | ORAL_TABLET | Freq: Four times a day (QID) | ORAL | Status: DC | PRN

## 2020-07-06 MED ORDER — SODIUM CHLORIDE 0.9% FLUSH
3.0000 mL | Freq: Two times a day (BID) | INTRAVENOUS | Status: DC
  Administered 2020-07-06 – 2020-07-08 (×5): 3 mL via INTRAVENOUS

## 2020-07-06 MED ORDER — ACETAMINOPHEN 325 MG PO TABS: 650.0000 mg | ORAL_TABLET | Freq: Four times a day (QID) | ORAL | Status: DC | PRN

## 2020-07-06 MED ORDER — AMIODARONE HCL 200 MG PO TABS
200.0000 mg | ORAL_TABLET | Freq: Every day | ORAL | Status: DC
  Administered 2020-07-07 – 2020-07-09 (×3): 200 mg via ORAL
  Filled 2020-07-06 (×3): qty 1

## 2020-07-06 MED ORDER — ONDANSETRON HCL 4 MG/2ML IJ SOLN: 4.0000 mg | Freq: Four times a day (QID) | INTRAMUSCULAR | Status: DC | PRN

## 2020-07-06 MED ORDER — DOXAZOSIN MESYLATE 8 MG PO TABS
8.0000 mg | ORAL_TABLET | Freq: Every day | ORAL | Status: DC
  Administered 2020-07-07 – 2020-07-08 (×2): 8 mg via ORAL
  Filled 2020-07-06 (×2): qty 1

## 2020-07-06 MED ORDER — FUROSEMIDE 10 MG/ML IJ SOLN
80.0000 mg | Freq: Three times a day (TID) | INTRAMUSCULAR | Status: DC
  Administered 2020-07-06 – 2020-07-09 (×9): 80 mg via INTRAVENOUS
  Filled 2020-07-06 (×9): qty 8

## 2020-07-06 MED ORDER — SILDENAFIL CITRATE 20 MG PO TABS
20.0000 mg | ORAL_TABLET | Freq: Three times a day (TID) | ORAL | Status: DC
  Administered 2020-07-06 – 2020-07-09 (×8): 20 mg via ORAL
  Filled 2020-07-06 (×8): qty 1

## 2020-07-06 MED ORDER — ATORVASTATIN CALCIUM 40 MG PO TABS
40.0000 mg | ORAL_TABLET | Freq: Every day | ORAL | Status: DC
Start: 2020-07-06 — End: 2020-07-09
  Administered 2020-07-06 – 2020-07-09 (×4): 40 mg via ORAL
  Filled 2020-07-06 (×4): qty 1

## 2020-07-06 NOTE — H&P (Signed)
History and Physical    Patricia Mata IRC:[id number] DOB: 04-Nov-1955 DOA: 07/06/2020  PCP: [name], FNP  Patient coming from: Home  I have personally briefly reviewed patient's old medical records in [place]  Chief Complaint: Worsening renal function  HPI: Patricia Mata is a 64 y.o. female with medical history significant for CKD stage IV, MDS with transfusion dependent anemia, chronic diastolic CHF, paroxysmal atrial fibrillation on amiodarone (not on anticoagulation due to anemia), PAH, severe AS, renal artery stenosis, carotid artery stenosis, hypertension, hyperlipidemia, hypothyroidism who presents to the ED for evaluation of worsening renal function and lower extremity edema.  Patient was recently hospitalized 06/07/2020-06/11/2020 for acute on chronic CHF. She is diuresed with IV Lasix. She was noted to have rising creatinine while in the hospital and Lasix was switched to oral. She received transfusion of 1 unit PRBC while in the hospital.  Patient states she has been gaining weight since her last admission, up to 260 pounds. She says she was 235 pounds from last hospitalization. She has been seen increased lower extremity edema and feels that her abdomen is also swelling. She has had dyspnea on exertion but denies orthopnea. She has been taking Lasix 40 mg daily and states her nephrologist increased her dose to twice daily 2 days ago. She has had decreased urine output than expected. She reports chronic cough occasionally productive of yellow sputum which is unchanged. She has intermittent nosebleeds.  She denies any chest pain, nausea, vomiting, subjective fevers, chills, diaphoresis.  ED Course:  Initial vitals showed BP 156/46, pulse 53, RR 16, temp 97.8 F, SPO2 98% on room air.  Labs show BUN 134, creatinine 6.45, sodium 130, potassium 4.8, bicarb 15, serum glucose 100, LFTs within normal limits, WBC 4.3, hemoglobin 8.8, platelets 100,000, BNP  932.6.  Urinalysis shows negative ketones, 100 protein, negative nitrites, small leukocytes, 0-5 RBC/hpf, 6-10 WBC/hpf, rare bacteria microscopy.  SARS-CoV-2 PCR panel was collected and pending.  2 view chest x-ray shows cardiomegaly with mild vascular congestions, small right pleural effusion.  EDP discussed with on-call nephrology, Dr. Hollie Salk who recommended starting IV Lasix 80 mg TID, medical admission to [facility], and nephrology will see in a.m. The hospitalist service was consulted to admit for further evaluation management.  Review of Systems: All systems reviewed and are negative except as documented in history of present illness above.   Past Medical History:  Diagnosis Date  . Acute renal failure (ARF) ([place]) 06/08/2018  . Cancer [facility]) 2008   Colon   . Chronic diastolic (congestive) heart failure ([place])   . Diabetes mellitus without complication ([place])   . Gout 09/08/2018  . Hypertension   . Macrocytic anemia   . MDS (myelodysplastic syndrome) ([place])   . Stage 4 chronic kidney disease [facility])     Past Surgical History:  Procedure Laterality Date  . COLON SURGERY    . DEBRIDMENT OF DECUBITUS ULCER N/A 06/12/2018   Procedure: DEBRIDMENT OF DECUBITUS ULCER;  Surgeon: Wallace Going, DO;  Location: [facility] ORS;  Service: Plastics;  Laterality: N/A;  . IR FLUORO GUIDE PORT INSERTION RIGHT  04/02/2017  . IR REMOVAL TUN ACCESS W/ PORT W/O FL MOD SED  03/16/2019  . IR US GUIDE VASC ACCESS RIGHT  04/02/2017  . RIGHT HEART CATH N/A 04/23/2019   Procedure: RIGHT HEART CATH;  Surgeon: Jolaine Artist, MD;  Location: [place] CV LAB;  Service: Cardiovascular;  Laterality: N/A;  . RIGHT/LEFT HEART CATH AND CORONARY ANGIOGRAPHY  N/A 05/21/2018   Procedure: RIGHT/LEFT HEART CATH AND CORONARY ANGIOGRAPHY;  Surgeon: Nelva Bush, MD;  Location: [place] CV LAB;  Service: Cardiovascular;  Laterality: N/A;    Social History:  reports that she quit smoking about 19 years  ago. Her smoking use included cigarettes. She has a 5.00 pack-year smoking history. She has quit using smokeless tobacco. She reports current alcohol use of about 2.0 standard drinks of alcohol per week. She reports that she does not use drugs.  Allergies  Allergen Reactions  . Ancef [Cefazolin] Itching    Severe itching- after procedure, ancef was the antibiotic.-04/02/17 Tolerates penicillins  . Lactose Intolerance (Gi) Diarrhea  . Sulfa Antibiotics Rash and Other (See Comments)    Blisters, also    Family History  Problem Relation Age of Onset  . Hypertension Mother   . Diabetes Mother   . Cervical cancer Mother   . Heart attack Father   . Hypertension Sister   . Hypertension Brother   . Hypertension Sister   . Hypertension Sister   . Prostate cancer Brother   . HIV/AIDS Brother      Prior to Admission medications   Medication Sig Start Date End Date Taking? Authorizing Provider  allopurinol (ZYLOPRIM) 100 MG tablet Take 1 tablet (100 mg total) by mouth daily. 06/12/20   Dwyane Dee, MD  amiodarone (PACERONE) 200 MG tablet Take 1 tablet (200 mg total) by mouth daily. 04/10/20   Bensimhon, Shaune Pascal, MD  amLODipine (NORVASC) 10 MG tablet Take 1 tablet (10 mg total) by mouth daily. 03/22/20   Larey Dresser, MD  atorvastatin (LIPITOR) 40 MG tablet Take 1 tablet (40 mg total) by mouth daily. 12/16/19   [name], FNP  blood glucose meter kit and supplies Dispense based on patient and insurance preference. Use up to four times daily as directed. (FOR ICD-10 E10.9, E11.9). Patient taking differently: 1 each by Other route See admin instructions. Dispense based on patient and insurance preference. Use up to four times daily as directed. (FOR ICD-10 E10.9, E11.9). 11/04/18   [name], FNP  Blood Glucose Monitoring Suppl (ONETOUCH ULTRALINK) w/Device KIT Check blood sugars twice daily E11.9 Patient taking differently: 1 each by Other route See admin instructions. Check blood  sugars twice daily E11.9 11/30/19   [name], FNP  carvedilol (COREG) 12.5 MG tablet Take 1 tablet (12.5 mg total) by mouth 2 (two) times daily. 05/22/20 08/20/20  Skeet Latch, MD  cloNIDine (CATAPRES) 0.1 MG tablet Take 1 tablet (0.1 mg total) by mouth 2 (two) times daily. 07/03/20   Bensimhon, Shaune Pascal, MD  colchicine 0.6 MG tablet Take 0.6 mg by mouth as directed. Take 1/2 tablet twice a day as needed for gout    [provider]  doxazosin (CARDURA) 8 MG tablet Take 1 tablet (8 mg total) by mouth daily. 04/27/20   Skeet Latch, MD  fluticasone [facility]) 50 MCG/ACT nasal spray Place 1 spray into both nostrils daily. Patient taking differently: Place 1 spray into both nostrils daily as needed for allergies.  01/28/20   Collene Gobble, MD  furosemide (LASIX) 40 MG tablet Take 1 tablet by mouth once daily Patient taking differently: Take 40 mg by mouth daily.  02/23/20   [name], FNP  glucose blood test strip check blood sugars twice daily Dx code E11.22 Patient taking differently: 1 each by Other route See admin instructions. check blood sugars twice daily Dx code E11.22 03/02/20   [name], FNP  hydrALAZINE (  APRESOLINE) 100 MG tablet Take 1 tablet (100 mg total) by mouth 3 (three) times daily. 02/22/20   Skeet Latch, MD  Insulin Glargine [facility]) 100 UNIT/ML Inject 0.14 mLs (14 Units total) into the skin daily. Patient taking differently: Inject 18 Units into the skin daily.  02/03/20   [name], FNP  Insulin Pen Needle 29G X 5MM MISC Use as directed Patient taking differently: 1 each by Other route See admin instructions. Use as directed 06/16/17   Bonnielee Haff, MD  Lancets (ONETOUCH DELICA PLUS [id number]) [place] check blood sugars twice daily Dx code: E11.22 Patient taking differently: 1 each by Other route See admin instructions. check blood sugars twice daily Dx code: E11.22 03/02/20   [name], FNP  latanoprost (XALATAN) 0.005 % ophthalmic  solution Place 1 drop into both eyes at bedtime.     [provider]  levothyroxine (SYNTHROID) 125 MCG tablet Take 1 tablet (125 mcg total) by mouth daily. 05/05/20 05/05/21  [name], FNP  linagliptin (TRADJENTA) 5 MG TABS tablet Take 1 tablet (5 mg total) by mouth daily. 02/03/20   [name], FNP  Multiple Vitamins-Minerals (CENTRUM SILVER 50+WOMEN) TABS Take 1 tablet by mouth daily.    [provider]  oxymetazoline (AFRIN) 0.05 % nasal spray Place 1 spray into both nostrils 2 (two) times daily as needed for congestion.    [provider]  sildenafil (REVATIO) 20 MG tablet TAKE 1 TABLET BY MOUTH THREE TIMES DAILY 07/03/20   Bensimhon, Shaune Pascal, MD    Physical Exam: Vitals:   07/06/20 1845 07/06/20 1900 07/06/20 1930 07/06/20 2000  BP:  (!) 140/52 (!) 153/50 (!) 150/57  Pulse: (!) 56 (!) 56 (!) 55 (!) 57  Resp: '15 16 16 16  ' Temp:      TempSrc:      SpO2: 93% 96% 96% 96%   Constitutional: Obese woman resting in bed with head elevated, NAD, calm, comfortable Eyes: PERRL, lids and conjunctivae normal ENMT: Mucous membranes are moist. Posterior pharynx clear of any exudate or lesions.Normal dentition.  Neck: normal, supple, no masses. Respiratory: clear to auscultation bilaterally, no wheezing, no crackles. Normal respiratory effort. No accessory muscle use.  Cardiovascular: Regular rate and rhythm, 3/6 systolic murmur. +2 bilateral lower extremity edema. Abdomen: no tenderness, no masses palpated. No hepatosplenomegaly. Bowel sounds positive.  Musculoskeletal: no clubbing / cyanosis. No joint deformity upper and lower extremities. Good ROM, no contractures. Normal muscle tone.  Skin: no rashes, lesions, ulcers. No induration Neurologic: CN 2-12 grossly intact. Sensation intact, Strength 5/5 in all 4.  Psychiatric: Normal judgment and insight. Alert and oriented x 3. Normal mood.    Labs on Admission: I have personally reviewed following labs and  imaging studies  CBC: Recent Labs  Lab 07/06/20 1733  WBC 4.3  NEUTROABS 3.1  HGB 8.8*  HCT 25.5*  MCV 92.1  PLT 174*   Basic Metabolic Panel: Recent Labs  Lab 07/06/20 1733  NA 130*  K 4.8  CL 98  CO2 15*  GLUCOSE 100*  BUN 134*  CREATININE 6.45*  CALCIUM 9.2   GFR: CrCl cannot be calculated (Unknown ideal weight.). Liver Function Tests: Recent Labs  Lab 07/06/20 1733  AST 28  ALT 16  ALKPHOS 64  BILITOT 1.2  PROT 7.2  ALBUMIN 3.9   No results for input(s): LIPASE, AMYLASE in the last 168 hours. No results for input(s): AMMONIA in the last 168 hours. Coagulation Profile: No results for input(s): INR, PROTIME in the  last 168 hours. Cardiac Enzymes: No results for input(s): CKTOTAL, CKMB, CKMBINDEX, TROPONINI in the last 168 hours. BNP (last 3 results) No results for input(s): PROBNP in the last 8760 hours. HbA1C: No results for input(s): HGBA1C in the last 72 hours. CBG: No results for input(s): GLUCAP in the last 168 hours. Lipid Profile: No results for input(s): CHOL, HDL, LDLCALC, TRIG, CHOLHDL, LDLDIRECT in the last 72 hours. Thyroid Function Tests: No results for input(s): TSH, T4TOTAL, FREET4, T3FREE, THYROIDAB in the last 72 hours. Anemia Panel: No results for input(s): VITAMINB12, FOLATE, FERRITIN, TIBC, IRON, RETICCTPCT in the last 72 hours. Urine analysis:    Component Value Date/Time   COLORURINE YELLOW 07/06/2020 1706   APPEARANCEUR HAZY (A) 07/06/2020 1706   LABSPEC 1.008 07/06/2020 1706   PHURINE 5.0 07/06/2020 1706   GLUCOSEU NEGATIVE 07/06/2020 1706   HGBUR NEGATIVE 07/06/2020 1706   BILIRUBINUR NEGATIVE 07/06/2020 1706   BILIRUBINUR negative 05/11/2020 1152   KETONESUR NEGATIVE 07/06/2020 1706   PROTEINUR 100 (A) 07/06/2020 1706   UROBILINOGEN 0.2 05/11/2020 1152   NITRITE NEGATIVE 07/06/2020 1706   LEUKOCYTESUR SMALL (A) 07/06/2020 1706    Radiological Exams on Admission: DG Chest 2 View  Result Date: 07/06/2020 CLINICAL  DATA:  64 year old female with shortness of breath. EXAM: CHEST - 2 VIEW COMPARISON:  Chest radiograph dated 06/07/2020. FINDINGS: Mild cardiomegaly with mild vascular congestion. There is a small right pleural effusion with right lung base atelectasis. Pneumonia is not excluded clinical correlation is recommended. No pneumothorax. Atherosclerotic calcification of the aorta. No acute osseous pathology. IMPRESSION: Cardiomegaly with mild vascular congestion and a small right pleural effusion. Pneumonia is not excluded. Electronically Signed   By: Anner Crete M.D.   On: 07/06/2020 17:23    EKG: Personally reviewed. Sinus rhythm, nonspecific T wave changes inferior leads. Not significantly changed when compared to prior.  Assessment/Plan Principal Problem:   Acute renal failure superimposed on stage 4 chronic kidney disease (HCC) Active Problems:   MDS (myelodysplastic syndrome) (HCC)   Essential hypertension   Hyperlipidemia   PAF (paroxysmal atrial fibrillation) (HCC)   Hyponatremia   Type 2 diabetes mellitus with hemoglobin A1c goal of less than 7.0% (HCC)   Hypothyroidism   Pulmonary hypertension (HCC)   Acute on chronic diastolic CHF (congestive heart failure) ([place])  Patricia Mata is a 64 y.o. female with medical history significant for CKD stage IV, MDS with transfusion dependent anemia, chronic diastolic CHF, paroxysmal atrial fibrillation on amiodarone (not on anticoagulation due to anemia), PAH, severe AS, renal artery stenosis, carotid artery stenosis, hypertension, hyperlipidemia, hypothyroidism who is admitted with acute renal failure superimposed on CKD stage IV.  Acute renal failure superimposed on CKD stage IV: Progressively worsening renal function over the past year with baseline creatinine ~2.0-2.6. Creatinine 6.45 on admission with GFR of 7. -Admit to [facility] -Nephrology consulted and to see in a.m. -Started on IV Lasix 80 mg TID per nephrology  recommendations -Monitor strict I/O's, daily weights -Check renal ultrasound  Acute on chronic diastolic CHF: EF 44-31%, V4MG by TTE 06/07/2020. Patient with increased peripheral edema, weight gain, elevated BNP in setting of worsening renal function. -Continue diuresis and management as above  Hyponatremia: Mild and likely secondary to hypervolemia. Continue diuresis as above and follow.  MDS with transfusion dependent anemia: Follows with hematology, Dr. Alen Blew. Currently on Reblozyl q3w for anemia with blood transfusion as needed.  Hypertension Renal artery stenosis: Follows with cardiology, Dr. Alvester Chou for renal artery stenosis with plan  for watchful waiting. -Continue amlodipine, Coreg, clonidine, doxazosin, hydralazine  Paroxysmal atrial fibrillation: Maintaining sinus rhythm on amiodarone. Not on anticoagulation due to transfusion dependent anemia.  Severe aortic stenosis Moderate to severe mitral stenosis Pulmonary artery hypertension: Per prior notes, not felt to be a candidate for intervention due to comorbidities. -Continue sildenafil  Thrombocytopenia: Holding VTE prophylaxis due to transfusion dependent anemia and high risk of bleeding. Continue to monitor.  Type 2 diabetes: Last A1c 6.0% 05/11/2020. Place on sensitive SSI and adjust further as needed.  Hypothyroidism: Continue Synthroid.  Hyperlipidemia: Continue atorvastatin.   DVT prophylaxis: SCDs Code Status: Full code, confirmed with patient Family Communication: Discussed with patient, she has discussed with family Disposition Plan: From home and likely discharge to home pending further management of acute on chronic renal failure, may need initiation of dialysis in hospital. Consults called: Nephrology Admission status:  Status is: Inpatient  Remains inpatient appropriate because:Inpatient level of care appropriate due to severity of illness   Dispo: The patient is from: Home               Anticipated d/c is to: Home              Anticipated d/c date is: 3 days              Patient currently is not medically stable to d/c.    Zada Finders MD [facility]  If 7PM-7AM, please contact night-coverage [email or web address]  07/06/2020, 8:35 PM

## 2020-07-06 NOTE — ED Notes (Signed)
Report given to Caryl Pina, Therapist, sports at [facility].  Pt SBAR information covered at this time.  No additional information requested by receiving nurse.  NADN.

## 2020-07-06 NOTE — ED Provider Notes (Signed)
[facility] DEPT Provider Note   CSN: [id number] Arrival date & time: 07/06/20  1533     History Chief Complaint  Patient presents with  . Urinary Retention    Patricia Mata is a 64 y.o. female.  HPI 64 year old female presents with decreased urine output and weight gain/fluid collection.  Has been gaining weight since her last admission, states she is up 16 pounds.  Is having swelling to her legs and abdomen.  Has shortness of breath with minimal exertion.  Some cough.  No fevers or chest pain.  Has been taking her Lasix as prescribed and recently her nephrologist increased her Lasix to twice per day a couple days ago.  Still not urinating like normal.   Past Medical History:  Diagnosis Date  . Acute renal failure (ARF) ([place]) 06/08/2018  . Cancer [facility]) 2008   Colon   . Chronic diastolic (congestive) heart failure ([place])   . Diabetes mellitus without complication ([place])   . Gout 09/08/2018  . Hypertension   . Macrocytic anemia   . MDS (myelodysplastic syndrome) ([place])   . Stage 4 chronic kidney disease [facility])     Patient Active Problem List   Diagnosis Date Noted  . Acute renal failure superimposed on stage 4 chronic kidney disease ([place])   . Palliative care by specialist   . Acute on chronic diastolic CHF (congestive heart failure) ([place]) 06/07/2020  . Chronic cough 05/04/2020  . Allergic rhinitis 03/23/2020  . Chronic kidney disease, stage 5 ([place]) 02/03/2020  . Purulent bronchitis ([place]) 01/28/2020  . Acute diastolic CHF (congestive heart failure) ([place]) 09/21/2019  . ARDS (adult respiratory distress syndrome) ([place])   . Acute respiratory failure with hypoxia ([place]) 09/20/2019  . HCAP (healthcare-associated pneumonia) 09/20/2019  . Pulmonary hypertension ([place]) 09/20/2019  . CKD (chronic kidney disease), stage III ([place]) 09/20/2019  . DM2 (diabetes mellitus, type 2) ([place]) 09/20/2019  . Elevated troponin 09/20/2019  . Severe sepsis ([place])  09/20/2019  . CAP (community acquired pneumonia) 09/14/2019  . Hypoxia 09/14/2019  . Neutropenia ([place]) 05/25/2019  . Malignant neoplasm of colon ([place]) 05/25/2019  . Bradycardia 03/21/2019  . Pneumonia due to COVID-19 virus 03/18/2019  . Type 2 diabetes mellitus with hemoglobin A1c goal of less than 7.0% ([place]) 12/10/2018  . Hypothyroidism 12/10/2018  . Seasonal allergies 12/10/2018  . Gout 09/08/2018  . 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 Sacral wound 06/08/2018  . Iron deficiency anemia due to chronic blood loss 06/08/2018  . Hyponatremia 06/08/2018  . Constipation 06/08/2018  . Encounter for nasogastric (NG) tube placement   . Pressure injury of skin 05/22/2018  . PAF (paroxysmal atrial fibrillation) ([place]) 05/11/2018  . Normocytic anemia 06/14/2017  . Aortic stenosis, severe 06/10/2017  . Carotid artery disease ([place]) 06/10/2017  . Essential hypertension 05/13/2017  . Hyperlipidemia 05/13/2017  . Bilateral lower extremity edema 05/13/2017  . Port-A-Cath in place 04/28/2017  . MDS (myelodysplastic syndrome) ([place]) 03/20/2017  . Goals of care, counseling/discussion 03/20/2017  . Anemia in chronic kidney disease 05/10/2016  . Anemia in stage 1 chronic kidney disease 05/10/2016  . Anemia due to chronic kidney disease 02/09/2016    Past Surgical History:  Procedure Laterality Date  . COLON SURGERY    . DEBRIDMENT OF DECUBITUS ULCER N/A 06/12/2018   Procedure: DEBRIDMENT OF DECUBITUS ULCER;  Surgeon: Wallace Going, DO;  Location: [facility] ORS;  Service: Plastics;  Laterality: N/A;  . IR FLUORO GUIDE PORT INSERTION RIGHT  04/02/2017  .  IR REMOVAL TUN ACCESS W/ PORT W/O FL MOD SED  03/16/2019  . IR US GUIDE VASC ACCESS RIGHT  04/02/2017  . RIGHT HEART CATH N/A 04/23/2019   Procedure: RIGHT HEART CATH;  Surgeon: Jolaine Artist, MD;  Location: [facility];  Service: Cardiovascular;  Laterality: N/A;  . RIGHT/LEFT HEART CATH AND CORONARY ANGIOGRAPHY N/A 05/21/2018    Procedure: RIGHT/LEFT HEART CATH AND CORONARY ANGIOGRAPHY;  Surgeon: Nelva Bush, MD;  Location: [facility];  Service: Cardiovascular;  Laterality: N/A;     OB History   No obstetric history on file.     Family History  Problem Relation Age of Onset  . Hypertension Mother   . Diabetes Mother   . Cervical cancer Mother   . Heart attack Father   . Hypertension Sister   . Hypertension Brother   . Hypertension Sister   . Hypertension Sister   . Prostate cancer Brother   . HIV/AIDS Brother     Social History   Tobacco Use  . Smoking status: Former Smoker    Packs/day: 0.25    Years: 20.00    Pack years: 5.00    Types: Cigarettes    Quit date: 01/31/2001    Years since quitting: 19.4  . Smokeless tobacco: Former Network engineer  . Vaping Use: Never used  Substance Use Topics  . Alcohol use: Yes    Alcohol/week: 2.0 standard drinks    Types: 2 Shots of liquor per week  . Drug use: Never    Home Medications Prior to Admission medications   Medication Sig Start Date End Date Taking? Authorizing Provider  allopurinol (ZYLOPRIM) 100 MG tablet Take 1 tablet (100 mg total) by mouth daily. 06/12/20   Dwyane Dee, MD  allopurinol (ZYLOPRIM) 300 MG tablet Take 300 mg by mouth daily. 07/05/20   [provider]  amiodarone (PACERONE) 200 MG tablet Take 1 tablet (200 mg total) by mouth daily. 04/10/20   Bensimhon, Shaune Pascal, MD  amLODipine (NORVASC) 10 MG tablet Take 1 tablet (10 mg total) by mouth daily. 03/22/20   Larey Dresser, MD  atorvastatin (LIPITOR) 40 MG tablet Take 1 tablet (40 mg total) by mouth daily. 12/16/19   [name], FNP  blood glucose meter kit and supplies Dispense based on patient and insurance preference. Use up to four times daily as directed. (FOR ICD-10 E10.9, E11.9). Patient taking differently: 1 each by Other route See admin instructions. Dispense based on patient and insurance preference. Use up to four times daily as directed.  (FOR ICD-10 E10.9, E11.9). 11/04/18   [name], FNP  Blood Glucose Monitoring Suppl (ONETOUCH ULTRALINK) w/Device KIT Check blood sugars twice daily E11.9 Patient taking differently: 1 each by Other route See admin instructions. Check blood sugars twice daily E11.9 11/30/19   [name], FNP  carvedilol (COREG) 12.5 MG tablet Take 1 tablet (12.5 mg total) by mouth 2 (two) times daily. 05/22/20 08/20/20  Skeet Latch, MD  cloNIDine (CATAPRES) 0.1 MG tablet Take 1 tablet (0.1 mg total) by mouth 2 (two) times daily. 07/03/20   Bensimhon, Shaune Pascal, MD  colchicine 0.6 MG tablet Take 0.6 mg by mouth as directed. Take 1/2 tablet twice a day as needed for gout    [provider]  doxazosin (CARDURA) 8 MG tablet Take 1 tablet (8 mg total) by mouth daily. 04/27/20   Skeet Latch, MD  fluticasone [facility]) 50 MCG/ACT nasal spray Place 1 spray into both nostrils daily.  Patient taking differently: Place 1 spray into both nostrils daily as needed for allergies.  01/28/20   Collene Gobble, MD  furosemide (LASIX) 40 MG tablet Take 1 tablet by mouth once daily Patient taking differently: Take 40 mg by mouth daily.  02/23/20   [name], FNP  glucose blood test strip check blood sugars twice daily Dx code E11.22 Patient taking differently: 1 each by Other route See admin instructions. check blood sugars twice daily Dx code E11.22 03/02/20   [name], FNP  hydrALAZINE (APRESOLINE) 100 MG tablet Take 1 tablet (100 mg total) by mouth 3 (three) times daily. 02/22/20   Skeet Latch, MD  Insulin Glargine [facility]) 100 UNIT/ML Inject 0.14 mLs (14 Units total) into the skin daily. Patient taking differently: Inject 18 Units into the skin daily.  02/03/20   [name], FNP  Insulin Pen Needle 29G X 5MM MISC Use as directed Patient taking differently: 1 each by Other route See admin instructions. Use as directed 06/16/17   Bonnielee Haff, MD  Lancets (ONETOUCH DELICA PLUS [id number])  [place] check blood sugars twice daily Dx code: E11.22 Patient taking differently: 1 each by Other route See admin instructions. check blood sugars twice daily Dx code: E11.22 03/02/20   [name], FNP  latanoprost (XALATAN) 0.005 % ophthalmic solution Place 1 drop into both eyes at bedtime.     [provider]  levothyroxine (SYNTHROID) 125 MCG tablet Take 1 tablet (125 mcg total) by mouth daily. 05/05/20 05/05/21  [name], FNP  linagliptin (TRADJENTA) 5 MG TABS tablet Take 1 tablet (5 mg total) by mouth daily. 02/03/20   [name], FNP  LOKELMA 10 g PACK packet Take 1 packet by mouth daily. 06/23/20   [provider]  loratadine (CLARITIN) 10 MG tablet Take 10 mg by mouth daily. 07/02/20   [provider]  Multiple Vitamins-Minerals (CENTRUM SILVER 50+WOMEN) TABS Take 1 tablet by mouth daily.    [provider]  oxymetazoline (AFRIN) 0.05 % nasal spray Place 1 spray into both nostrils 2 (two) times daily as needed for congestion.    [provider]  sildenafil (REVATIO) 20 MG tablet TAKE 1 TABLET BY MOUTH THREE TIMES DAILY 07/03/20   Bensimhon, Shaune Pascal, MD  sodium bicarbonate 650 MG tablet Take by mouth. 06/27/20   [provider]    Allergies    Ancef [cefazolin], Lactose intolerance (gi), and Sulfa antibiotics  Review of Systems   Review of Systems  Constitutional: Negative for fever.  Respiratory: Positive for cough and shortness of breath.   Cardiovascular: Positive for leg swelling. Negative for chest pain.  Gastrointestinal: Positive for abdominal distention. Negative for abdominal pain.  All other systems reviewed and are negative.   Physical Exam Updated Vital Signs BP (!) 146/42 (BP Location: Right Arm)   Pulse (!) 58   Temp 97.8 F (36.6 C) (Oral)   Resp 14   Ht '5\' 4"'  (1.626 m)   Wt 104.7 kg   SpO2 96%   BMI 39.62 kg/m   Physical Exam Vitals and nursing note reviewed.  Constitutional:      Appearance:  She is well-developed and well-nourished. She is obese.  HENT:     Head: Normocephalic and atraumatic.     Right Ear: External ear normal.     Left Ear: External ear normal.     Nose: Nose normal.  Eyes:     General:        Right eye: No discharge.  Left eye: No discharge.  Cardiovascular:     Rate and Rhythm: Normal rate and regular rhythm.     Heart sounds: Murmur heard.    Pulmonary:     Effort: Pulmonary effort is normal.     Breath sounds: Wheezing (slight, expiratory) present.  Abdominal:     Palpations: Abdomen is soft.     Tenderness: There is no abdominal tenderness.  Musculoskeletal:     Right lower leg: Edema present.     Left lower leg: Edema present.  Skin:    General: Skin is warm and dry.  Neurological:     Mental Status: She is alert.  Psychiatric:        Mood and Affect: Mood is not anxious.     ED Results / Procedures / Treatments   Labs (all labs ordered are listed, but only abnormal results are displayed) Labs Reviewed  COMPREHENSIVE METABOLIC PANEL - Abnormal; Notable for the following components:      Result Value   Sodium 130 (*)    CO2 15 (*)    Glucose, Bld 100 (*)    BUN 134 (*)    Creatinine, Ser 6.45 (*)    GFR, Estimated 7 (*)    Anion gap 17 (*)    All other components within normal limits  BRAIN NATRIURETIC PEPTIDE - Abnormal; Notable for the following components:   B Natriuretic Peptide 932.6 (*)    All other components within normal limits  CBC WITH DIFFERENTIAL/PLATELET - Abnormal; Notable for the following components:   RBC 2.77 (*)    Hemoglobin 8.8 (*)    HCT 25.5 (*)    RDW 19.7 (*)    Platelets 100 (*)    nRBC 2.8 (*)    Lymphs Abs 0.4 (*)    All other components within normal limits  URINALYSIS, ROUTINE W REFLEX MICROSCOPIC - Abnormal; Notable for the following components:   APPearance HAZY (*)    Protein, ur 100 (*)    Leukocytes,Ua SMALL (*)    Bacteria, UA RARE (*)    All other components within normal  limits  GLUCOSE, CAPILLARY - Abnormal; Notable for the following components:   Glucose-Capillary 66 (*)    All other components within normal limits  RESP PANEL BY RT-PCR (FLU A&B, COVID) [id number]  GLUCOSE, CAPILLARY  MAGNESIUM  CBC  BASIC METABOLIC PANEL    EKG EKG Interpretation  Date/Time:  Thursday July 06 2020 16:41:14 EST Ventricular Rate:  60 PR Interval:    QRS Duration: 96 QT Interval:  423 QTC Calculation: 423 R Axis:   25 Text Interpretation: Sinus rhythm Short PR interval Nonspecific T abnormalities, inferior leads T wave changes simialr to Nov 2021 Confirmed by Sherwood Gambler [phone]) on 07/06/2020 5:01:38 PM   Radiology DG Chest 2 View  Result Date: 07/06/2020 CLINICAL DATA:  64 year old female with shortness of breath. EXAM: CHEST - 2 VIEW COMPARISON:  Chest radiograph dated 06/07/2020. FINDINGS: Mild cardiomegaly with mild vascular congestion. There is a small right pleural effusion with right lung base atelectasis. Pneumonia is not excluded clinical correlation is recommended. No pneumothorax. Atherosclerotic calcification of the aorta. No acute osseous pathology. IMPRESSION: Cardiomegaly with mild vascular congestion and a small right pleural effusion. Pneumonia is not excluded. Electronically Signed   By: Anner Crete M.D.   On: 07/06/2020 17:23   US RENAL  Result Date: 07/06/2020 CLINICAL DATA:  Acute on chronic renal failure, hypertension, diabetes EXAM: RENAL / URINARY TRACT ULTRASOUND COMPLETE COMPARISON:  06/10/2020  FINDINGS: Right Kidney: Renal measurements: 11.0 x 5.3 by 5.8 cm = volume: 177.3 mL. There is mild increased renal cortical echotexture, stable. No hydronephrosis or nephrolithiasis. No renal mass. Left Kidney: Renal measurements: 11.8 x 6.1 x 5.9 cm = volume: 224.1 mL. There is mild increased renal cortical echotexture, stable. No hydronephrosis or nephrolithiasis. 1.1 cm cysts ventral cortex mid left kidney. Bladder: The bladder is  decompressed which limits its evaluation. Other: None. IMPRESSION: 1. Stable mild increased renal cortical echotexture consistent with medical renal disease. Electronically Signed   By: Randa Ngo M.D.   On: 07/06/2020 21:12    Procedures Procedures (including critical care time)  Medications Ordered in ED Medications  furosemide (LASIX) injection 80 mg (80 mg Intravenous Given 07/06/20 1930)  sodium chloride flush (NS) 0.9 % injection 3 mL (has no administration in time range)  acetaminophen (TYLENOL) tablet 650 mg (has no administration in time range)    Or  acetaminophen (TYLENOL) suppository 650 mg (has no administration in time range)  ondansetron (ZOFRAN) tablet 4 mg (has no administration in time range)    Or  ondansetron (ZOFRAN) injection 4 mg (has no administration in time range)  insulin aspart (novoLOG) injection 0-9 Units (has no administration in time range)  amiodarone (PACERONE) tablet 200 mg (has no administration in time range)  amLODipine (NORVASC) tablet 10 mg (has no administration in time range)  atorvastatin (LIPITOR) tablet 40 mg (has no administration in time range)  carvedilol (COREG) tablet 12.5 mg (has no administration in time range)  cloNIDine (CATAPRES) tablet 0.1 mg (has no administration in time range)  doxazosin (CARDURA) tablet 8 mg (has no administration in time range)  hydrALAZINE (APRESOLINE) tablet 100 mg (has no administration in time range)  levothyroxine (SYNTHROID) tablet 125 mcg (has no administration in time range)  sildenafil (REVATIO) tablet 20 mg (has no administration in time range)  diphenhydrAMINE (BENADRYL) injection 12.5 mg (has no administration in time range)    ED Course  I have reviewed the triage vital signs and the nursing notes.  Pertinent labs & imaging results that were available during my care of the patient were reviewed by me and considered in my medical decision making (see chart for details).    MDM  Rules/Calculators/A&P                          Patient's x-ray, ECG and lab work have been reviewed.  It does appear that she has fluid overload with elevated BNP and effusions on chest x-ray.  Unfortunately her kidney function is also worse.  I discussed with Dr. Hollie Salk, who recommends sizable Lasix with 80 mg 3 times daily and transferred to [facility] for admission.  Hospitalist to admit.  Nephrology will see tomorrow.  Does not appear to be retaining urine. Final Clinical Impression(s) / ED Diagnoses Final diagnoses:  Acute on chronic diastolic congestive heart failure (HCC)  Acute kidney injury superimposed on chronic kidney disease [facility])    Rx / DC Orders ED Discharge Orders    None       Sherwood Gambler, MD 07/06/20 2307

## 2020-07-06 NOTE — ED Notes (Signed)
Pulse 68 while ambulating

## 2020-07-06 NOTE — ED Notes (Signed)
Pt to restroom with assistance.  NADN.  Will continue to monitor.

## 2020-07-06 NOTE — Progress Notes (Signed)
Hypoglycemic Event  CBG: 66  Treatment: dinner  Symptoms: asymtpomatic  Follow-up CBG: Time 2242 CBG Result: 93  Possible Reasons for Event: had not eaten much today  Comments/MD notified: protocol followed    [place]

## 2020-07-06 NOTE — ED Triage Notes (Signed)
Patient BIBA from home, PCP recommended she be evaluated d/t urinary retention. She was doubled on lasix dosage two days ago and continues to retain fluid. Also c/o bilateral lower leg edema.   94% RA 173/58 P 91

## 2020-07-06 NOTE — ED Notes (Signed)
Pulse Ox 98% while ambulating

## 2020-07-07 DIAGNOSIS — N184 Chronic kidney disease, stage 4 (severe): Secondary | ICD-10-CM | POA: Diagnosis not present

## 2020-07-07 DIAGNOSIS — N179 Acute kidney failure, unspecified: Principal | ICD-10-CM

## 2020-07-07 LAB — CBC
HCT: 24.5 % — ABNORMAL LOW (ref 36.0–46.0)
Hemoglobin: 8.4 g/dL — ABNORMAL LOW (ref 12.0–15.0)
MCH: 30.9 pg (ref 26.0–34.0)
MCHC: 34.3 g/dL (ref 30.0–36.0)
MCV: 90.1 fL (ref 80.0–100.0)
Platelets: 100 10*3/uL — ABNORMAL LOW (ref 150–400)
RBC: 2.72 MIL/uL — ABNORMAL LOW (ref 3.87–5.11)
RDW: 19.6 % — ABNORMAL HIGH (ref 11.5–15.5)
WBC: 5.2 10*3/uL (ref 4.0–10.5)
nRBC: 1.9 % — ABNORMAL HIGH (ref 0.0–0.2)

## 2020-07-07 LAB — BASIC METABOLIC PANEL
Anion gap: 13 (ref 5–15)
BUN: 152 mg/dL — ABNORMAL HIGH (ref 8–23)
CO2: 19 mmol/L — ABNORMAL LOW (ref 22–32)
Calcium: 9 mg/dL (ref 8.9–10.3)
Chloride: 100 mmol/L (ref 98–111)
Creatinine, Ser: 6.26 mg/dL — ABNORMAL HIGH (ref 0.44–1.00)
GFR, Estimated: 7 mL/min — ABNORMAL LOW (ref >60)
Glucose, Bld: 167 mg/dL — ABNORMAL HIGH (ref 70–99)
Potassium: 4.5 mmol/L (ref 3.5–5.1)
Sodium: 132 mmol/L — ABNORMAL LOW (ref 135–145)

## 2020-07-07 LAB — GLUCOSE, CAPILLARY
Glucose-Capillary: 102 mg/dL — ABNORMAL HIGH (ref 70–99)
Glucose-Capillary: 147 mg/dL — ABNORMAL HIGH (ref 70–99)
Glucose-Capillary: 198 mg/dL — ABNORMAL HIGH (ref 70–99)
Glucose-Capillary: 194 mg/dL — ABNORMAL HIGH (ref 70–99)

## 2020-07-07 LAB — PROTEIN / CREATININE RATIO, URINE
Creatinine, Urine: 37.95 mg/dL
Protein Creatinine Ratio: 0.79 mg/mg{Cre} — ABNORMAL HIGH (ref 0.00–0.15)
Total Protein, Urine: 30 mg/dL

## 2020-07-07 LAB — MAGNESIUM: Magnesium: 1.9 mg/dL (ref 1.7–2.4)

## 2020-07-07 MED ORDER — MELATONIN 5 MG PO TABS
5.0000 mg | ORAL_TABLET | Freq: Every day | ORAL | Status: DC
  Administered 2020-07-07 – 2020-07-08 (×2): 5 mg via ORAL
  Filled 2020-07-07 (×2): qty 1

## 2020-07-07 MED ORDER — LORAZEPAM 2 MG/ML IJ SOLN
0.5000 mg | Freq: Once | INTRAMUSCULAR | Status: AC
  Administered 2020-07-07: 0.5 mg via INTRAVENOUS
  Filled 2020-07-07: qty 1

## 2020-07-07 NOTE — Consult Note (Addendum)
[facility]  HISTORY AND PHYSICAL  Patricia Mata is an 63 y.o. female.    Chief Complaint: abd/ leg swelling/ weight gain/ abnormal labs  HPI: Pt is [name] with a PMH sig for HTN, HLD, Afib, pHTN on Revatio, chronic diastolic CHF, DM II, renal artery stenosis, severe AS, h/o colon cancer, MDS, and CKD stage IV who is now seen in consultation at the request of Dr Wynelle Cleveland for evaluation and management of AKI on CKD and volume overload.    Pt presented yesterday to the ED for abd/ leg swelling and a 16 lb weight gain.  She is followed by [facility] (Dr Carolin Sicks).  Last visit was 06/26/20 where Lasix was increased from 20-->40.  Unfortunately she has continued to experience weight gain and swelling and lasix has been increased to 80 mg this week.  She has had labs checked this week too and Cr has gone from 3.42 (11/28)--> 4.3 (12/8) --> 5.42 (12/16).  In this setting she was referred to the ED for admission.  Once at the ED, pt was noted to have Cr of 6.45, BUN 134, Na 130, K 4.8, Co2 15.  She was given Lasix 80 IV with 1.3L urine output.  In this setting we are asked to see.  Med review notes that she is not in ACEI/ARB or aldactone (stopped during previous hospitalization).   Her last hospitalization was 11/24-11/28 for AKI on CKD, volume overload.    Pt reports that she's feeling better since coming.  Abd distention is better.  Denies f/c, n/v, SOB, CP, n/v, hiccups, metallic taste, itching, jerking movements.   PMH: Past Medical History:  Diagnosis Date  . Acute renal failure (ARF) ([place]) 06/08/2018  . Cancer [facility]) 2008   Colon   . Chronic diastolic (congestive) heart failure ([place])   . Diabetes mellitus without complication ([place])   . Gout 09/08/2018  . Hypertension   . Macrocytic anemia   . MDS (myelodysplastic syndrome) ([place])   . Stage 4 chronic kidney disease (HCC)    PSH: Past Surgical History:  Procedure Laterality Date  . COLON SURGERY    . DEBRIDMENT OF DECUBITUS  ULCER N/A 06/12/2018   Procedure: DEBRIDMENT OF DECUBITUS ULCER;  Surgeon: Wallace Going, DO;  Location: [facility];  Service: Plastics;  Laterality: N/A;  . IR FLUORO GUIDE PORT INSERTION RIGHT  04/02/2017  . IR REMOVAL TUN ACCESS W/ PORT W/O FL MOD SED  03/16/2019  . IR US GUIDE VASC ACCESS RIGHT  04/02/2017  . RIGHT HEART CATH N/A 04/23/2019   Procedure: RIGHT HEART CATH;  Surgeon: Jolaine Artist, MD;  Location: [facility];  Service: Cardiovascular;  Laterality: N/A;  . RIGHT/LEFT HEART CATH AND CORONARY ANGIOGRAPHY N/A 05/21/2018   Procedure: RIGHT/LEFT HEART CATH AND CORONARY ANGIOGRAPHY;  Surgeon: Nelva Bush, MD;  Location: [facility];  Service: Cardiovascular;  Laterality: N/A;    Past Medical History:  Diagnosis Date  . Acute renal failure (ARF) ([place]) 06/08/2018  . Cancer [facility]) 2008   Colon   . Chronic diastolic (congestive) heart failure ([place])   . Diabetes mellitus without complication ([place])   . Gout 09/08/2018  . Hypertension   . Macrocytic anemia   . MDS (myelodysplastic syndrome) ([place])   . Stage 4 chronic kidney disease (HCC)     Medications:   Scheduled: . amiodarone  200 mg Oral Daily  . amLODipine  10 mg Oral Daily  . atorvastatin  40 mg Oral Daily  .  carvedilol  12.5 mg Oral BID  . cloNIDine  0.1 mg Oral BID  . doxazosin  8 mg Oral Daily  . furosemide  80 mg Intravenous Q8H  . hydrALAZINE  100 mg Oral TID  . insulin aspart  0-9 Units Subcutaneous TID WC  . levothyroxine  125 mcg Oral Q0600  . sildenafil  20 mg Oral TID  . sodium chloride flush  3 mL Intravenous Q12H    Medications Prior to Admission  Medication Sig Dispense Refill  . allopurinol (ZYLOPRIM) 300 MG tablet Take 300 mg by mouth daily.    Marland Kitchen amiodarone (PACERONE) 200 MG tablet Take 1 tablet (200 mg total) by mouth daily. 90 tablet 1  . amLODipine (NORVASC) 10 MG tablet Take 1 tablet (10 mg total) by mouth daily. 90 tablet 3  . atorvastatin (LIPITOR) 40 MG tablet Take 1  tablet (40 mg total) by mouth daily. 90 tablet 1  . carvedilol (COREG) 12.5 MG tablet Take 1 tablet (12.5 mg total) by mouth 2 (two) times daily. 180 tablet 3  . cloNIDine (CATAPRES) 0.1 MG tablet Take 1 tablet (0.1 mg total) by mouth 2 (two) times daily. 60 tablet 11  . doxazosin (CARDURA) 8 MG tablet Take 1 tablet (8 mg total) by mouth daily. 30 tablet 6  . fluticasone (FLONASE) 50 MCG/ACT nasal spray Place 1 spray into both nostrils daily. 16 g 2  . furosemide (LASIX) 40 MG tablet Take 1 tablet by mouth once daily (Patient taking differently: Take 40 mg by mouth 2 (two) times daily.) 90 tablet 0  . hydrALAZINE (APRESOLINE) 100 MG tablet Take 1 tablet (100 mg total) by mouth 3 (three) times daily. 90 tablet 3  . Insulin Glargine (BASAGLAR KWIKPEN) 100 UNIT/ML Inject 0.14 mLs (14 Units total) into the skin daily. (Patient taking differently: Inject 18 Units into the skin daily.) 5 pen 1  . latanoprost (XALATAN) 0.005 % ophthalmic solution Place 1 drop into both eyes at bedtime.     Marland Kitchen levothyroxine (SYNTHROID) 125 MCG tablet Take 1 tablet (125 mcg total) by mouth daily. 30 tablet 6  . linagliptin (TRADJENTA) 5 MG TABS tablet Take 1 tablet (5 mg total) by mouth daily. 90 tablet 1  . loratadine (CLARITIN) 10 MG tablet Take 10 mg by mouth daily.    . Multiple Vitamins-Minerals (CENTRUM SILVER 50+WOMEN) TABS Take 1 tablet by mouth daily.    . sildenafil (REVATIO) 20 MG tablet TAKE 1 TABLET BY MOUTH THREE TIMES DAILY (Patient taking differently: Take 20 mg by mouth 3 (three) times daily.) 90 tablet 0  . sodium bicarbonate 650 MG tablet Take 650 mg by mouth 2 (two) times daily.    Marland Kitchen allopurinol (ZYLOPRIM) 100 MG tablet Take 1 tablet (100 mg total) by mouth daily. (Patient not taking: Reported on 07/07/2020) 30 tablet 3  . blood glucose meter kit and supplies Dispense based on patient and insurance preference. Use up to four times daily as directed. (FOR ICD-10 E10.9, E11.9). (Patient taking differently: 1  each by Other route See admin instructions. Dispense based on patient and insurance preference. Use up to four times daily as directed. (FOR ICD-10 E10.9, E11.9).) 1 each 3  . Blood Glucose Monitoring Suppl (ONETOUCH ULTRALINK) w/Device KIT Check blood sugars twice daily E11.9 (Patient taking differently: 1 each by Other route See admin instructions. Check blood sugars twice daily E11.9) 1 kit 0  . glucose blood test strip check blood sugars twice daily Dx code E11.22 (Patient taking differently: 1 each  by Other route See admin instructions. check blood sugars twice daily Dx code E11.22) 100 each 3  . Insulin Pen Needle 29G X 5MM MISC Use as directed (Patient taking differently: 1 each by Other route See admin instructions. Use as directed) 200 each 0  . Lancets (ONETOUCH DELICA PLUS [id number]) MISC check blood sugars twice daily Dx code: E11.22 (Patient taking differently: 1 each by Other route See admin instructions. check blood sugars twice daily Dx code: E11.22) 100 each 3    ALLERGIES:   Allergies  Allergen Reactions  . Ancef [Cefazolin] Itching    Severe itching- after procedure, ancef was the antibiotic.-04/02/17 Tolerates penicillins  . Lactose Intolerance (Gi) Diarrhea  . Sulfa Antibiotics Rash and Other (See Comments)    Blisters, also    FAM HX: Family History  Problem Relation Age of Onset  . Hypertension Mother   . Diabetes Mother   . Cervical cancer Mother   . Heart attack Father   . Hypertension Sister   . Hypertension Brother   . Hypertension Sister   . Hypertension Sister   . Prostate cancer Brother   . HIV/AIDS Brother     Social History:   reports that she quit smoking about 19 years ago. Her smoking use included cigarettes. She has a 5.00 pack-year smoking history. She has quit using smokeless tobacco. She reports current alcohol use of about 2.0 standard drinks of alcohol per week. She reports that she does not use drugs.  ROS: ROS: all other systems reviewed  and are negative except per HPI  Blood pressure (!) 139/43, pulse 60, temperature 98 F (36.7 C), temperature source Oral, resp. rate 18, height '5\' 4"'  (1.626 m), weight 104.4 kg, SpO2 94 %. PHYSICAL EXAM: Physical Exam  GEN nad, sitting in bed HEENT EOMI PERRL  NECK + JVD PULM clear bilaterally no c/w/r CV RRR, IV/VI systolic murmur LUSB ABD + distention, no frank fluid wave EXT 1+ LE edema bilaterally NEURO AAO x 3 nonfocal, no asterixis SKIN no rashes    Results for orders placed or performed during the hospital encounter of 07/06/20 (from the past 48 hour(s))  Urinalysis, Routine w reflex microscopic Urine, Clean Catch     Status: Abnormal   Collection Time: 07/06/20  5:06 PM  Result Value Ref Range   Color, Urine YELLOW YELLOW   APPearance HAZY (A) CLEAR   Specific Gravity, Urine 1.008 1.005 - 1.030   pH 5.0 5.0 - 8.0   Glucose, UA NEGATIVE NEGATIVE mg/dL   Hgb urine dipstick NEGATIVE NEGATIVE   Bilirubin Urine NEGATIVE NEGATIVE   Ketones, ur NEGATIVE NEGATIVE mg/dL   Protein, ur 100 (A) NEGATIVE mg/dL   Nitrite NEGATIVE NEGATIVE   Leukocytes,Ua SMALL (A) NEGATIVE   RBC / HPF 0-5 0 - 5 RBC/hpf   WBC, UA 6-10 0 - 5 WBC/hpf   Bacteria, UA RARE (A) NONE SEEN   Squamous Epithelial / LPF 0-5 0 - 5   Ca Oxalate Crys, UA PRESENT     Comment: Performed at [facility], [place] [address], [place], [place] [address]  Comprehensive metabolic panel     Status: Abnormal   Collection Time: 07/06/20  5:33 PM  Result Value Ref Range   Sodium 130 (L) 135 - 145 mmol/L   Potassium 4.8 3.5 - 5.1 mmol/L   Chloride 98 98 - 111 mmol/L   CO2 15 (L) 22 - 32 mmol/L   Glucose, Bld 100 (H) 70 - 99 mg/dL  Comment: Glucose reference range applies only to samples taken after fasting for at least 8 hours.   BUN 134 (H) 8 - 23 mg/dL    Comment: RESULTS CONFIRMED BY MANUAL DILUTION   Creatinine, Ser 6.45 (H) 0.44 - 1.00 mg/dL   Calcium 9.2 8.9 - 10.3 mg/dL   Total Protein 7.2  6.5 - 8.1 g/dL   Albumin 3.9 3.5 - 5.0 g/dL   AST 28 15 - 41 U/L   ALT 16 0 - 44 U/L   Alkaline Phosphatase 64 38 - 126 U/L   Total Bilirubin 1.2 0.3 - 1.2 mg/dL   GFR, Estimated 7 (L) >60 mL/min    Comment: (NOTE) Calculated using the CKD-EPI Creatinine Equation (2021)    Anion gap 17 (H) 5 - 15    Comment: Performed at [facility], [place] [address], [place], [place] [address]  Brain natriuretic peptide     Status: Abnormal   Collection Time: 07/06/20  5:33 PM  Result Value Ref Range   B Natriuretic Peptide 932.6 (H) 0.0 - 100.0 pg/mL    Comment: Performed at [facility], [place] [address], [place], [place] [address]  CBC with Differential     Status: Abnormal   Collection Time: 07/06/20  5:33 PM  Result Value Ref Range   WBC 4.3 4.0 - 10.5 K/uL   RBC 2.77 (L) 3.87 - 5.11 MIL/uL   Hemoglobin 8.8 (L) 12.0 - 15.0 g/dL   HCT 25.5 (L) 36.0 - 46.0 %   MCV 92.1 80.0 - 100.0 fL   MCH 31.8 26.0 - 34.0 pg   MCHC 34.5 30.0 - 36.0 g/dL   RDW 19.7 (H) 11.5 - 15.5 %   Platelets 100 (L) 150 - 400 K/uL    Comment: REPEATED TO VERIFY PLATELET COUNT CONFIRMED BY SMEAR SPECIMEN CHECKED FOR CLOTS Immature Platelet Fraction may be clinically indicated, consider ordering this additional test [id number]    nRBC 2.8 (H) 0.0 - 0.2 %   Neutrophils Relative % 71 %   Neutro Abs 3.1 1.7 - 7.7 K/uL   Lymphocytes Relative 9 %   Lymphs Abs 0.4 (L) 0.7 - 4.0 K/uL   Monocytes Relative 18 %   Monocytes Absolute 0.8 0.1 - 1.0 K/uL   Eosinophils Relative 1 %   Eosinophils Absolute 0.0 0.0 - 0.5 K/uL   Basophils Relative 0 %   Basophils Absolute 0.0 0.0 - 0.1 K/uL   Immature Granulocytes 1 %   Abs Immature Granulocytes 0.03 0.00 - 0.07 K/uL   Target Cells PRESENT    Ovalocytes PRESENT     Comment: Performed at [facility], [place] [address], [place],  [address]  Resp Panel by RT-PCR (Flu A&B, Covid) Nasopharyngeal Swab     Status: None    Collection Time: 07/06/20  7:26 PM   Specimen: Nasopharyngeal Swab; Nasopharyngeal(NP) swabs in vial transport medium  Result Value Ref Range   SARS Coronavirus 2 by RT PCR NEGATIVE NEGATIVE    Comment: (NOTE) SARS-CoV-2 target nucleic acids are NOT DETECTED.  The SARS-CoV-2 RNA is generally detectable in upper respiratory specimens during the acute phase of infection. The lowest concentration of SARS-CoV-2 viral copies this assay can detect is 138 copies/mL. A negative result does not preclude SARS-Cov-2 infection and should not be used as the sole basis for treatment or other patient management decisions. A negative result may occur with  improper specimen collection/handling, submission of specimen other than nasopharyngeal swab, presence of viral  mutation(s) within the areas targeted by this assay, and inadequate number of viral copies(<138 copies/mL). A negative result must be combined with clinical observations, patient history, and epidemiological information. The expected result is Negative.  Fact Sheet for Patients:  EntrepreneurPulse.com.au  Fact Sheet for Healthcare Providers:  IncredibleEmployment.be  This test is no t yet approved or cleared by the [place] FDA and  has been authorized for detection and/or diagnosis of SARS-CoV-2 by FDA under an Emergency Use Authorization (EUA). This EUA will remain  in effect (meaning this test can be used) for the duration of the COVID-19 declaration under Section 564(b)(1) of the Act, 21 U.S.C.section 360bbb-3(b)(1), unless the authorization is terminated  or revoked sooner.       Influenza A by PCR NEGATIVE NEGATIVE   Influenza B by PCR NEGATIVE NEGATIVE    Comment: (NOTE) The Xpert Xpress SARS-CoV-2/FLU/RSV plus assay is intended as an aid in the diagnosis of influenza from Nasopharyngeal swab specimens and should not be used as a sole basis for treatment. Nasal washings  and aspirates are unacceptable for Xpert Xpress SARS-CoV-2/FLU/RSV testing.  Fact Sheet for Patients: EntrepreneurPulse.com.au  Fact Sheet for Healthcare Providers: IncredibleEmployment.be  This test is not yet approved or cleared by the [place] FDA and has been authorized for detection and/or diagnosis of SARS-CoV-2 by FDA under an Emergency Use Authorization (EUA). This EUA will remain in effect (meaning this test can be used) for the duration of the COVID-19 declaration under Section 564(b)(1) of the Act, 21 U.S.C. section 360bbb-3(b)(1), unless the authorization is terminated or revoked.  Performed at [facility], [place] [address], [place], [place] [address]   Glucose, capillary     Status: Abnormal   Collection Time: 07/06/20  9:54 PM  Result Value Ref Range   Glucose-Capillary 66 (L) 70 - 99 mg/dL    Comment: Glucose reference range applies only to samples taken after fasting for at least 8 hours.   Comment 1 Notify RN    Comment 2 Document in Chart   Glucose, capillary     Status: None   Collection Time: 07/06/20 10:42 PM  Result Value Ref Range   Glucose-Capillary 93 70 - 99 mg/dL    Comment: Glucose reference range applies only to samples taken after fasting for at least 8 hours.  Magnesium     Status: None   Collection Time: 07/07/20  1:38 AM  Result Value Ref Range   Magnesium 1.9 1.7 - 2.4 mg/dL    Comment: Performed at [place] [address], [place], [place] [address]  CBC     Status: Abnormal   Collection Time: 07/07/20  1:38 AM  Result Value Ref Range   WBC 5.2 4.0 - 10.5 K/uL   RBC 2.72 (L) 3.87 - 5.11 MIL/uL   Hemoglobin 8.4 (L) 12.0 - 15.0 g/dL   HCT 24.5 (L) 36.0 - 46.0 %   MCV 90.1 80.0 - 100.0 fL   MCH 30.9 26.0 - 34.0 pg   MCHC 34.3 30.0 - 36.0 g/dL   RDW 19.6 (H) 11.5 - 15.5 %   Platelets 100 (L) 150 - 400 K/uL    Comment: REPEATED TO VERIFY PLATELET COUNT CONFIRMED BY  SMEAR SPECIMEN CHECKED FOR CLOTS Immature Platelet Fraction may be clinically indicated, consider ordering this additional test [id number]    nRBC 1.9 (H) 0.0 - 0.2 %    Comment: Performed at [facility], [place] [address], [place], [place] [address]  Basic metabolic panel     Status: Abnormal   Collection Time: 07/07/20  1:38 AM  Result Value Ref Range   Sodium 132 (L) 135 - 145 mmol/L   Potassium 4.5 3.5 - 5.1 mmol/L   Chloride 100 98 - 111 mmol/L   CO2 19 (L) 22 - 32 mmol/L   Glucose, Bld 167 (H) 70 - 99 mg/dL    Comment: Glucose reference range applies only to samples taken after fasting for at least 8 hours.   BUN 152 (H) 8 - 23 mg/dL   Creatinine, Ser 6.26 (H) 0.44 - 1.00 mg/dL   Calcium 9.0 8.9 - 10.3 mg/dL   GFR, Estimated 7 (L) >60 mL/min    Comment: (NOTE) Calculated using the CKD-EPI Creatinine Equation (2021)    Anion gap 13 5 - 15    Comment: Performed at [facility] [address] [place], [place] [address]  Glucose, capillary     Status: Abnormal   Collection Time: 07/07/20  6:35 AM  Result Value Ref Range   Glucose-Capillary 102 (H) 70 - 99 mg/dL    Comment: Glucose reference range applies only to samples taken after fasting for at least 8 hours.   *Note: Due to a large number of results and/or encounters for the requested time period, some results have not been displayed. A complete set of results can be found in Results Review.    DG Chest 2 View  Result Date: 07/06/2020 CLINICAL DATA:  64 year old female with shortness of breath. EXAM: CHEST - 2 VIEW COMPARISON:  Chest radiograph dated 06/07/2020. FINDINGS: Mild cardiomegaly with mild vascular congestion. There is a small right pleural effusion with right lung base atelectasis. Pneumonia is not excluded clinical correlation is recommended. No pneumothorax. Atherosclerotic calcification of the aorta. No acute osseous pathology. IMPRESSION: Cardiomegaly with mild vascular congestion and a small right  pleural effusion. Pneumonia is not excluded. Electronically Signed   By: Anner Crete M.D.   On: 07/06/2020 17:23   US RENAL  Result Date: 07/06/2020 CLINICAL DATA:  Acute on chronic renal failure, hypertension, diabetes EXAM: RENAL / URINARY TRACT ULTRASOUND COMPLETE COMPARISON:  06/10/2020 FINDINGS: Right Kidney: Renal measurements: 11.0 x 5.3 by 5.8 cm = volume: 177.3 mL. There is mild increased renal cortical echotexture, stable. No hydronephrosis or nephrolithiasis. No renal mass. Left Kidney: Renal measurements: 11.8 x 6.1 x 5.9 cm = volume: 224.1 mL. There is mild increased renal cortical echotexture, stable. No hydronephrosis or nephrolithiasis. 1.1 cm cysts ventral cortex mid left kidney. Bladder: The bladder is decompressed which limits its evaluation. Other: None. IMPRESSION: 1. Stable mild increased renal cortical echotexture consistent with medical renal disease. Electronically Signed   By: Randa Ngo M.D.   On: 07/06/2020 21:12    Assessment/Plan  1.  AKI on CKD IV: Cr 06/11/20 3.42.  Unfortunately has been rising to 4.3 (12/8) --> 5.42 (12/16) in the setting of volume overload.  She is azotemic but not uremic.  UA pretty bland, 100 mg protein but really no cells.  Will do UP/C.  Will continue to diurese aggressively with Lasix 80 IV TID but we may be facing a situation soon where she will need to begin dialysis.  We discussed this briefly today.  She is hopeful for renal recovery but knows that this may not be possible.  Renal US previous hospitalization showed size discrepancy in kidneys but [place] 12/23 didn't note this- R kidney 11.0 cm, L kidney 11.8 cm.  2.  Acute  on chronic diastolic CHF: on BB, no ACEi/ARB, spironolactone, SGLT2i d/t low eGFR.  Followed by Dr Haroldine Laws.  3.  Severe AS: noted on TTE 05/2020.    4.  Afib: on amiodarone, no AC  5.  PHTN: on Revatio  6.  MDS: followed by Dr Alen Blew  7.  Dispo: admitted  Madelon Lips 07/07/2020, 8:14 AM

## 2020-07-07 NOTE — Progress Notes (Signed)
PROGRESS NOTE    Patricia Mata   LFY:[id number]  DOB: 05/27/1956  DOA: 07/06/2020 PCP: [name], FNP   Brief Narrative:  Patricia Mata is a 64 y.o. female with medical history significant for CKD stage IV, MDS with transfusion dependent anemia, chronic diastolic CHF, paroxysmal atrial fibrillation on amiodarone (not on anticoagulation due to anemia), PAH, severe AS, renal artery stenosis, carotid artery stenosis, hypertension, hyperlipidemia, hypothyroidism who presents to the ED for evaluation of worsening renal function and lower extremity edema. She was hospitalized from 06/07/2020-06/11/2020 for acute on chronic CHF. She is diuresed with IV Lasix. She admits to steadily gaining weight since the last admission despite increasing lasix to 40mg  BID per her nephrologist's advice.  In ED > Cr 6.45, sodium 130 CXR > mild vascular congestion and small right pleural effusion   Subjective: Feels that her swelling is improving.     Assessment & Plan:   Principal Problem:   Acute renal failure superimposed on stage 4 chronic kidney disease   Acute on chronic diastolic CHF - cont Lasix per nephrology- on 80 mg every 8 hrs  - the patient is clinically improving - Cr slightly improved - no signs of uremia  Active Problems:   MDS (myelodysplastic syndrome)  - apparently is dependent on blood transfusions    Essential hypertension and renal artery stenosis - cont Norvasc, Coreg, Catapres, Cardura, Hydralazine    Pulmonary hypertension   - cont Revatio    Hyperlipidemia - Lipitor    PAF (paroxysmal atrial fibrillation) - Amiodarone at 200 mg daily - she is not on AC    Type 2 diabetes mellitus with hemoglobin A1c goal of less than 7.0%  - last A1c was 6.0 in 10/21 - Glargine and Linagliptan on hold - sugars stable for now- follow - cont SSI   Hypothyroidism - Cont Synthroid      Time spent in minutes: 35 DVT prophylaxis: SCDs Start: 07/06/20  2019  Code Status: full code Family Communication:  Disposition Plan:  Status is: Inpatient  Remains inpatient appropriate because:IV treatments appropriate due to intensity of illness or inability to take PO   Dispo: The patient is from: Home              Anticipated d/c is to: Home              Anticipated d/c date is: > 3 days              Patient currently is not medically stable to d/c.      Consultants:   Nephrology Procedures:   none Antimicrobials:  Anti-infectives (From admission, onward)   None       Objective: Vitals:   07/07/20 0439 07/07/20 0639 07/07/20 0755 07/07/20 1127  BP: (!) 137/44 (!) 138/47 (!) 139/43 (!) 131/49  Pulse: (!) 59 (!) 55 60 61  Resp: 19  18 18   Temp: 98.7 F (37.1 C)  98 F (36.7 C) 98.4 F (36.9 C)  TempSrc: Oral  Oral Oral  SpO2: 97%  94% 96%  Weight:      Height:        Intake/Output Summary (Last 24 hours) at 07/07/2020 1309 Last data filed at 07/07/2020 1000 Gross per 24 hour  Intake 685 ml  Output 1990 ml  Net -1305 ml   Filed Weights   07/06/20 2132 07/07/20 0144  Weight: 104.7 kg 104.4 kg    Examination: General exam: Appears comfortable  HEENT: PERRLA, oral mucosa moist,  no sclera icterus or thrush Respiratory system: Clear to auscultation. Respiratory effort normal. Cardiovascular system: S1 & S2 heard, RRR.   Gastrointestinal system: Abdomen soft, non-tender, nondistended. Normal bowel sounds. Central nervous system: Alert and oriented. No focal neurological deficits. Extremities: No cyanosis, clubbing or edema Skin: No rashes or ulcers Psychiatry:  Mood & affect appropriate.     Data Reviewed: I have personally reviewed following labs and imaging studies  CBC: Recent Labs  Lab 07/06/20 1733 07/07/20 0138  WBC 4.3 5.2  NEUTROABS 3.1  --   HGB 8.8* 8.4*  HCT 25.5* 24.5*  MCV 92.1 90.1  PLT 100* 297*   Basic Metabolic Panel: Recent Labs  Lab 07/06/20 1733 07/07/20 0138  NA 130* 132*   K 4.8 4.5  CL 98 100  CO2 15* 19*  GLUCOSE 100* 167*  BUN 134* 152*  CREATININE 6.45* 6.26*  CALCIUM 9.2 9.0  MG  --  1.9   GFR: Estimated Creatinine Clearance: 10.6 mL/min (A) (by C-G formula based on SCr of 6.26 mg/dL (H)). Liver Function Tests: Recent Labs  Lab 07/06/20 1733  AST 28  ALT 16  ALKPHOS 64  BILITOT 1.2  PROT 7.2  ALBUMIN 3.9   No results for input(s): LIPASE, AMYLASE in the last 168 hours. No results for input(s): AMMONIA in the last 168 hours. Coagulation Profile: No results for input(s): INR, PROTIME in the last 168 hours. Cardiac Enzymes: No results for input(s): CKTOTAL, CKMB, CKMBINDEX, TROPONINI in the last 168 hours. BNP (last 3 results) No results for input(s): PROBNP in the last 8760 hours. HbA1C: No results for input(s): HGBA1C in the last 72 hours. CBG: Recent Labs  Lab 07/06/20 2154 07/06/20 2242 07/07/20 0635 07/07/20 1128  GLUCAP 66* 93 102* 147*   Lipid Profile: No results for input(s): CHOL, HDL, LDLCALC, TRIG, CHOLHDL, LDLDIRECT in the last 72 hours. Thyroid Function Tests: No results for input(s): TSH, T4TOTAL, FREET4, T3FREE, THYROIDAB in the last 72 hours. Anemia Panel: No results for input(s): VITAMINB12, FOLATE, FERRITIN, TIBC, IRON, RETICCTPCT in the last 72 hours. Urine analysis:    Component Value Date/Time   COLORURINE YELLOW 07/06/2020 1706   APPEARANCEUR HAZY (A) 07/06/2020 1706   LABSPEC 1.008 07/06/2020 1706   PHURINE 5.0 07/06/2020 1706   GLUCOSEU NEGATIVE 07/06/2020 1706   HGBUR NEGATIVE 07/06/2020 1706   BILIRUBINUR NEGATIVE 07/06/2020 1706   BILIRUBINUR negative 05/11/2020 1152   KETONESUR NEGATIVE 07/06/2020 1706   PROTEINUR 100 (A) 07/06/2020 1706   UROBILINOGEN 0.2 05/11/2020 1152   NITRITE NEGATIVE 07/06/2020 1706   LEUKOCYTESUR SMALL (A) 07/06/2020 1706   Sepsis Labs: @LABRCNTIP (procalcitonin:4,lacticidven:4) ) Recent Results (from the past 240 hour(s))  Resp Panel by RT-PCR (Flu A&B, Covid)  Nasopharyngeal Swab     Status: None   Collection Time: 07/06/20  7:26 PM   Specimen: Nasopharyngeal Swab; Nasopharyngeal(NP) swabs in vial transport medium  Result Value Ref Range Status   SARS Coronavirus 2 by RT PCR NEGATIVE NEGATIVE Final    Comment: (NOTE) SARS-CoV-2 target nucleic acids are NOT DETECTED.  The SARS-CoV-2 RNA is generally detectable in upper respiratory specimens during the acute phase of infection. The lowest concentration of SARS-CoV-2 viral copies this assay can detect is 138 copies/mL. A negative result does not preclude SARS-Cov-2 infection and should not be used as the sole basis for treatment or other patient management decisions. A negative result may occur with  improper specimen collection/handling, submission of specimen other than nasopharyngeal swab, presence of viral mutation(s) within the  areas targeted by this assay, and inadequate number of viral copies(<138 copies/mL). A negative result must be combined with clinical observations, patient history, and epidemiological information. The expected result is Negative.  Fact Sheet for Patients:  EntrepreneurPulse.com.au  Fact Sheet for Healthcare Providers:  IncredibleEmployment.be  This test is no t yet approved or cleared by the [place] FDA and  has been authorized for detection and/or diagnosis of SARS-CoV-2 by FDA under an Emergency Use Authorization (EUA). This EUA will remain  in effect (meaning this test can be used) for the duration of the COVID-19 declaration under Section 564(b)(1) of the Act, 21 U.S.C.section 360bbb-3(b)(1), unless the authorization is terminated  or revoked sooner.       Influenza A by PCR NEGATIVE NEGATIVE Final   Influenza B by PCR NEGATIVE NEGATIVE Final    Comment: (NOTE) The Xpert Xpress SARS-CoV-2/FLU/RSV plus assay is intended as an aid in the diagnosis of influenza from Nasopharyngeal swab specimens and should not be  used as a sole basis for treatment. Nasal washings and aspirates are unacceptable for Xpert Xpress SARS-CoV-2/FLU/RSV testing.  Fact Sheet for Patients: EntrepreneurPulse.com.au  Fact Sheet for Healthcare Providers: IncredibleEmployment.be  This test is not yet approved or cleared by the [place] FDA and has been authorized for detection and/or diagnosis of SARS-CoV-2 by FDA under an Emergency Use Authorization (EUA). This EUA will remain in effect (meaning this test can be used) for the duration of the COVID-19 declaration under Section 564(b)(1) of the Act, 21 U.S.C. section 360bbb-3(b)(1), unless the authorization is terminated or revoked.  Performed at [facility], [place] [address], [place], [place] [address]          Radiology Studies: DG Chest 2 View  Result Date: 07/06/2020 CLINICAL DATA:  64 year old female with shortness of breath. EXAM: CHEST - 2 VIEW COMPARISON:  Chest radiograph dated 06/07/2020. FINDINGS: Mild cardiomegaly with mild vascular congestion. There is a small right pleural effusion with right lung base atelectasis. Pneumonia is not excluded clinical correlation is recommended. No pneumothorax. Atherosclerotic calcification of the aorta. No acute osseous pathology. IMPRESSION: Cardiomegaly with mild vascular congestion and a small right pleural effusion. Pneumonia is not excluded. Electronically Signed   By: Anner Crete M.D.   On: 07/06/2020 17:23   US RENAL  Result Date: 07/06/2020 CLINICAL DATA:  Acute on chronic renal failure, hypertension, diabetes EXAM: RENAL / URINARY TRACT ULTRASOUND COMPLETE COMPARISON:  06/10/2020 FINDINGS: Right Kidney: Renal measurements: 11.0 x 5.3 by 5.8 cm = volume: 177.3 mL. There is mild increased renal cortical echotexture, stable. No hydronephrosis or nephrolithiasis. No renal mass. Left Kidney: Renal measurements: 11.8 x 6.1 x 5.9 cm = volume: 224.1 mL. There  is mild increased renal cortical echotexture, stable. No hydronephrosis or nephrolithiasis. 1.1 cm cysts ventral cortex mid left kidney. Bladder: The bladder is decompressed which limits its evaluation. Other: None. IMPRESSION: 1. Stable mild increased renal cortical echotexture consistent with medical renal disease. Electronically Signed   By: Randa Ngo M.D.   On: 07/06/2020 21:12      Scheduled Meds: . amiodarone  200 mg Oral Daily  . amLODipine  10 mg Oral Daily  . atorvastatin  40 mg Oral Daily  . carvedilol  12.5 mg Oral BID  . cloNIDine  0.1 mg Oral BID  . doxazosin  8 mg Oral Daily  . furosemide  80 mg Intravenous Q8H  . hydrALAZINE  100 mg Oral TID  . insulin aspart  0-9 Units Subcutaneous TID  WC  . levothyroxine  125 mcg Oral Q0600  . sildenafil  20 mg Oral TID  . sodium chloride flush  3 mL Intravenous Q12H   Continuous Infusions:   LOS: 1 day      Debbe Odea, MD [facility] Pager: [email or web address] 07/07/2020, 1:09 PM

## 2020-07-08 DIAGNOSIS — N179 Acute kidney failure, unspecified: Secondary | ICD-10-CM | POA: Diagnosis not present

## 2020-07-08 DIAGNOSIS — N184 Chronic kidney disease, stage 4 (severe): Secondary | ICD-10-CM | POA: Diagnosis not present

## 2020-07-08 LAB — BASIC METABOLIC PANEL
Anion gap: 14 (ref 5–15)
BUN: 144 mg/dL — ABNORMAL HIGH (ref 8–23)
CO2: 19 mmol/L — ABNORMAL LOW (ref 22–32)
Calcium: 9.2 mg/dL (ref 8.9–10.3)
Chloride: 100 mmol/L (ref 98–111)
Creatinine, Ser: 5.33 mg/dL — ABNORMAL HIGH (ref 0.44–1.00)
GFR, Estimated: 8 mL/min — ABNORMAL LOW (ref >60)
Glucose, Bld: 243 mg/dL — ABNORMAL HIGH (ref 70–99)
Potassium: 3.9 mmol/L (ref 3.5–5.1)
Sodium: 133 mmol/L — ABNORMAL LOW (ref 135–145)

## 2020-07-08 LAB — GLUCOSE, CAPILLARY
Glucose-Capillary: 147 mg/dL — ABNORMAL HIGH (ref 70–99)
Glucose-Capillary: 209 mg/dL — ABNORMAL HIGH (ref 70–99)
Glucose-Capillary: 175 mg/dL — ABNORMAL HIGH (ref 70–99)
Glucose-Capillary: 182 mg/dL — ABNORMAL HIGH (ref 70–99)

## 2020-07-08 MED ORDER — POLYETHYLENE GLYCOL 3350 17 G PO PACK
17.0000 g | PACK | Freq: Two times a day (BID) | ORAL | Status: DC
  Administered 2020-07-08 – 2020-07-09 (×2): 17 g via ORAL
  Filled 2020-07-08 (×2): qty 1

## 2020-07-08 MED ORDER — DOXAZOSIN MESYLATE 8 MG PO TABS
4.0000 mg | ORAL_TABLET | Freq: Every day | ORAL | Status: DC
  Administered 2020-07-09: 4 mg via ORAL
  Filled 2020-07-08: qty 1

## 2020-07-08 MED ORDER — AMLODIPINE BESYLATE 5 MG PO TABS
5.0000 mg | ORAL_TABLET | Freq: Every day | ORAL | Status: DC
  Administered 2020-07-09: 5 mg via ORAL
  Filled 2020-07-08: qty 1

## 2020-07-08 MED ORDER — DARBEPOETIN ALFA 150 MCG/0.3ML IJ SOSY
150.0000 ug | PREFILLED_SYRINGE | INTRAMUSCULAR | Status: DC
  Administered 2020-07-08: 150 ug via SUBCUTANEOUS
  Filled 2020-07-08: qty 0.3

## 2020-07-08 MED ORDER — TRAZODONE HCL 50 MG PO TABS
50.0000 mg | ORAL_TABLET | Freq: Every day | ORAL | Status: DC
  Administered 2020-07-08: 50 mg via ORAL
  Filled 2020-07-08: qty 1

## 2020-07-08 MED ORDER — SALINE SPRAY 0.65 % NA SOLN
1.0000 | NASAL | Status: DC | PRN
  Filled 2020-07-08: qty 44

## 2020-07-08 NOTE — Progress Notes (Signed)
Subjective:  4 liters of UOP on lasix 80 q8-  Negative 3.6 liters- crt actually down a little- BUN up - BP stable at 161 systolic- not uremic in the least  Objective Vital signs in last 24 hours: Vitals:   07/07/20 1127 07/07/20 1646 07/07/20 1949 07/08/20 0514  BP: (!) 131/49 (!) 137/43 (!) 130/49 (!) 138/38  Pulse: 61  (!) 58 62  Resp: '18  18 16  ' Temp: 98.4 F (36.9 C)  98.6 F (37 C) 97.8 F (36.6 C)  TempSrc: Oral  Oral Oral  SpO2: 96%  100% 99%  Weight:    102.1 kg  Height:       Weight change: -2.631 kg  Intake/Output Summary (Last 24 hours) at 07/08/2020 1116 Last data filed at 07/08/2020 0960 Gross per 24 hour  Intake 243 ml  Output 3400 ml  Net -3157 ml    Assessment/ Plan: Pt is a 64 y.o. yo female with many medical issues including pulm HTN and advanced CKD- crt 3.5 at baseline who was admitted on 07/06/2020 with volume overload   Assessment/Plan  1.  AKI on CKD IV: Cr 06/11/20 3.42.  Unfortunately has been rising to 4.3 (12/8) --> 5.42 (12/16) in the setting of volume overload.  She is azotemic but not uremic.  UA pretty bland.   Will continue to diurese aggressively with Lasix 80 IV TID for another 24 hours but we may be facing a situation soon where she will need to begin dialysis.   She is hopeful for renal recovery but knows that this may not be possible.  crt down a little but BUN up to uncomfortable range- 150--  BUT albumin is good and I cannot get a uremic sxm out of her.  Will be difficult to convince her that dialysis is likely the very best/most efficient way to handle this as opposed to frequent hospitalizations and titrations of diuretics- probably needs AVF placement at the least   2.  Acute on chronic diastolic CHF: on BB, no ACEi/ARB, spironolactone, SGLT2i d/t low eGFR.  Followed by Dr Haroldine Laws.  Diuresed a fair bit- says her target weight is in the 210's-  I am going to decrease norvasc and cardura as I dont want her BP to drop and give [place]  ATN  3.  Severe AS: noted on TTE 05/2020.    4.  Afib: on amiodarone, no AC  5.  PHTN: on Revatio  6.  MDS: will check iron stores and give ESA  7. Hyponatremia-  Due to volume overload-  Better with diuresis      Louis Meckel    Labs: Basic Metabolic Panel: Recent Labs  Lab 07/06/20 1733 07/07/20 0138  NA 130* 132*  K 4.8 4.5  CL 98 100  CO2 15* 19*  GLUCOSE 100* 167*  BUN 134* 152*  CREATININE 6.45* 6.26*  CALCIUM 9.2 9.0   Liver Function Tests: Recent Labs  Lab 07/06/20 1733  AST 28  ALT 16  ALKPHOS 64  BILITOT 1.2  PROT 7.2  ALBUMIN 3.9   No results for input(s): LIPASE, AMYLASE in the last 168 hours. No results for input(s): AMMONIA in the last 168 hours. CBC: Recent Labs  Lab 07/06/20 1733 07/07/20 0138  WBC 4.3 5.2  NEUTROABS 3.1  --   HGB 8.8* 8.4*  HCT 25.5* 24.5*  MCV 92.1 90.1  PLT 100* 100*   Cardiac Enzymes: No results for input(s): CKTOTAL, CKMB, CKMBINDEX, TROPONINI in the last 168 hours.  CBG: Recent Labs  Lab 07/07/20 0635 07/07/20 1128 07/07/20 1628 07/07/20 2114 07/08/20 0652  GLUCAP 102* 147* 198* 194* 147*    Iron Studies: No results for input(s): IRON, TIBC, TRANSFERRIN, FERRITIN in the last 72 hours. Studies/Results: DG Chest 2 View  Result Date: 07/06/2020 CLINICAL DATA:  64 year old female with shortness of breath. EXAM: CHEST - 2 VIEW COMPARISON:  Chest radiograph dated 06/07/2020. FINDINGS: Mild cardiomegaly with mild vascular congestion. There is a small right pleural effusion with right lung base atelectasis. Pneumonia is not excluded clinical correlation is recommended. No pneumothorax. Atherosclerotic calcification of the aorta. No acute osseous pathology. IMPRESSION: Cardiomegaly with mild vascular congestion and a small right pleural effusion. Pneumonia is not excluded. Electronically Signed   By: Anner Crete M.D.   On: 07/06/2020 17:23   US RENAL  Result Date: 07/06/2020 CLINICAL DATA:   Acute on chronic renal failure, hypertension, diabetes EXAM: RENAL / URINARY TRACT ULTRASOUND COMPLETE COMPARISON:  06/10/2020 FINDINGS: Right Kidney: Renal measurements: 11.0 x 5.3 by 5.8 cm = volume: 177.3 mL. There is mild increased renal cortical echotexture, stable. No hydronephrosis or nephrolithiasis. No renal mass. Left Kidney: Renal measurements: 11.8 x 6.1 x 5.9 cm = volume: 224.1 mL. There is mild increased renal cortical echotexture, stable. No hydronephrosis or nephrolithiasis. 1.1 cm cysts ventral cortex mid left kidney. Bladder: The bladder is decompressed which limits its evaluation. Other: None. IMPRESSION: 1. Stable mild increased renal cortical echotexture consistent with medical renal disease. Electronically Signed   By: Randa Ngo M.D.   On: 07/06/2020 21:12   Medications: Infusions:   Scheduled Medications: . amiodarone  200 mg Oral Daily  . amLODipine  10 mg Oral Daily  . atorvastatin  40 mg Oral Daily  . carvedilol  12.5 mg Oral BID  . cloNIDine  0.1 mg Oral BID  . doxazosin  8 mg Oral Daily  . furosemide  80 mg Intravenous Q8H  . hydrALAZINE  100 mg Oral TID  . insulin aspart  0-9 Units Subcutaneous TID WC  . levothyroxine  125 mcg Oral Q0600  . melatonin  5 mg Oral QHS  . sildenafil  20 mg Oral TID  . sodium chloride flush  3 mL Intravenous Q12H    have reviewed scheduled and prn medications.  Physical Exam: General: alert, friendly- reports feeling better Heart: RRR Lungs: mostly clear Abdomen: soft, non tender Extremities: pitting edema still     07/08/2020,11:16 AM  LOS: 2 days

## 2020-07-08 NOTE — Progress Notes (Signed)
PROGRESS NOTE    Patricia Mata   UYQ:[id number]  DOB: 07-01-1956  DOA: 07/06/2020 PCP: [name], FNP   Brief Narrative:  Patricia Mata is a 64 y.o. female with medical history significant for CKD stage IV, MDS with transfusion dependent anemia, chronic diastolic CHF, paroxysmal atrial fibrillation on amiodarone (not on anticoagulation due to anemia), PAH, severe AS, renal artery stenosis, carotid artery stenosis, hypertension, hyperlipidemia, hypothyroidism who presents to the ED for evaluation of worsening renal function and lower extremity edema. She was hospitalized from 06/07/2020-06/11/2020 for acute on chronic CHF. She is diuresed with IV Lasix. She admits to steadily gaining weight since the last admission despite increasing lasix to 40mg  BID per her nephrologist's advice.  In ED > Cr 6.45, sodium 130 CXR > mild vascular congestion and small right pleural effusion   Subjective: No new complaints.     Assessment & Plan:   Principal Problem:   Acute renal failure superimposed on stage 4 chronic kidney disease   Acute on chronic diastolic CHF  Hyponatremia - cont Lasix per nephrology- on 80 mg every 8 hrs  - the patient is clinically improving  - Cr has improved to 5.33 today from 6.45 - Sodium is steadily rising - no signs of uremia  Active Problems:   MDS (myelodysplastic syndrome) - anemia and thrombocytopenia noted one exam - apparently is dependent on blood transfusions    Essential hypertension and renal artery stenosis - cont Norvasc, Coreg, Catapres, Cardura, Hydralazine    Pulmonary hypertension   - cont Revatio    Hyperlipidemia - Lipitor    PAF (paroxysmal atrial fibrillation) - Amiodarone at 200 mg daily - she is not on AC    Type 2 diabetes mellitus with hemoglobin A1c goal of less than 7.0%  - last A1c was 6.0 in 10/21 - Glargine and Linagliptan on hold - sugars stable for now- follow - cont SSI   Hypothyroidism - Cont  Synthroid      Time spent in minutes: 35 DVT prophylaxis: SCDs Start: 07/06/20 2019  Code Status: full code Family Communication:  Disposition Plan:  Status is: Inpatient  Remains inpatient appropriate because:IV treatments appropriate due to intensity of illness or inability to take PO   Dispo: The patient is from: Home              Anticipated d/c is to: Home              Anticipated d/c date is: > 3 days              Patient currently is not medically stable to d/c.      Consultants:   Nephrology Procedures:   none Antimicrobials:  Anti-infectives (From admission, onward)   None       Objective: Vitals:   07/07/20 1646 07/07/20 1949 07/08/20 0514 07/08/20 1141  BP: (!) 137/43 (!) 130/49 (!) 138/38 (!) 145/49  Pulse:  (!) 58 62 60  Resp:  18 16 17   Temp:  98.6 F (37 C) 97.8 F (36.6 C) 97.9 F (36.6 C)  TempSrc:  Oral Oral Oral  SpO2:  100% 99% 96%  Weight:   102.1 kg   Height:        Intake/Output Summary (Last 24 hours) at 07/08/2020 1159 Last data filed at 07/08/2020 1141 Gross per 24 hour  Intake 243 ml  Output 4300 ml  Net -4057 ml   Filed Weights   07/06/20 2132 07/07/20 0144 07/08/20 0514  Weight:  104.7 kg 104.4 kg 102.1 kg    Examination: General exam: Appears comfortable  HEENT: PERRLA, oral mucosa moist, no sclera icterus or thrush Respiratory system: Clear to auscultation. Respiratory effort normal. Cardiovascular system: S1 & S2 heard,  No murmurs  Gastrointestinal system: Abdomen soft, non-tender, nondistended. Normal bowel sounds   Central nervous system: Alert and oriented. No focal neurological deficits. Extremities: No cyanosis, clubbing or edema Skin: No rashes or ulcers Psychiatry:  Mood & affect appropriate.   Data Reviewed: I have personally reviewed following labs and imaging studies  CBC: Recent Labs  Lab 07/06/20 1733 07/07/20 0138  WBC 4.3 5.2  NEUTROABS 3.1  --   HGB 8.8* 8.4*  HCT 25.5* 24.5*  MCV  92.1 90.1  PLT 100* 811*   Basic Metabolic Panel: Recent Labs  Lab 07/06/20 1733 07/07/20 0138 07/08/20 1010  NA 130* 132* 133*  K 4.8 4.5 3.9  CL 98 100 100  CO2 15* 19* 19*  GLUCOSE 100* 167* 243*  BUN 134* 152* 144*  CREATININE 6.45* 6.26* 5.33*  CALCIUM 9.2 9.0 9.2  MG  --  1.9  --    GFR: Estimated Creatinine Clearance: 12.2 mL/min (A) (by C-G formula based on SCr of 5.33 mg/dL (H)). Liver Function Tests: Recent Labs  Lab 07/06/20 1733  AST 28  ALT 16  ALKPHOS 64  BILITOT 1.2  PROT 7.2  ALBUMIN 3.9   No results for input(s): LIPASE, AMYLASE in the last 168 hours. No results for input(s): AMMONIA in the last 168 hours. Coagulation Profile: No results for input(s): INR, PROTIME in the last 168 hours. Cardiac Enzymes: No results for input(s): CKTOTAL, CKMB, CKMBINDEX, TROPONINI in the last 168 hours. BNP (last 3 results) No results for input(s): PROBNP in the last 8760 hours. HbA1C: No results for input(s): HGBA1C in the last 72 hours. CBG: Recent Labs  Lab 07/07/20 1128 07/07/20 1628 07/07/20 2114 07/08/20 0652 07/08/20 1135  GLUCAP 147* 198* 194* 147* 209*   Lipid Profile: No results for input(s): CHOL, HDL, LDLCALC, TRIG, CHOLHDL, LDLDIRECT in the last 72 hours. Thyroid Function Tests: No results for input(s): TSH, T4TOTAL, FREET4, T3FREE, THYROIDAB in the last 72 hours. Anemia Panel: No results for input(s): VITAMINB12, FOLATE, FERRITIN, TIBC, IRON, RETICCTPCT in the last 72 hours. Urine analysis:    Component Value Date/Time   COLORURINE YELLOW 07/06/2020 1706   APPEARANCEUR HAZY (A) 07/06/2020 1706   LABSPEC 1.008 07/06/2020 1706   PHURINE 5.0 07/06/2020 1706   GLUCOSEU NEGATIVE 07/06/2020 1706   HGBUR NEGATIVE 07/06/2020 1706   BILIRUBINUR NEGATIVE 07/06/2020 1706   BILIRUBINUR negative 05/11/2020 1152   KETONESUR NEGATIVE 07/06/2020 1706   PROTEINUR 100 (A) 07/06/2020 1706   UROBILINOGEN 0.2 05/11/2020 1152   NITRITE NEGATIVE  07/06/2020 1706   LEUKOCYTESUR SMALL (A) 07/06/2020 1706   Sepsis Labs: @LABRCNTIP (procalcitonin:4,lacticidven:4) ) Recent Results (from the past 240 hour(s))  Resp Panel by RT-PCR (Flu A&B, Covid) Nasopharyngeal Swab     Status: None   Collection Time: 07/06/20  7:26 PM   Specimen: Nasopharyngeal Swab; Nasopharyngeal(NP) swabs in vial transport medium  Result Value Ref Range Status   SARS Coronavirus 2 by RT PCR NEGATIVE NEGATIVE Final    Comment: (NOTE) SARS-CoV-2 target nucleic acids are NOT DETECTED.  The SARS-CoV-2 RNA is generally detectable in upper respiratory specimens during the acute phase of infection. The lowest concentration of SARS-CoV-2 viral copies this assay can detect is 138 copies/mL. A negative result does not preclude SARS-Cov-2 infection and should  not be used as the sole basis for treatment or other patient management decisions. A negative result may occur with  improper specimen collection/handling, submission of specimen other than nasopharyngeal swab, presence of viral mutation(s) within the areas targeted by this assay, and inadequate number of viral copies(<138 copies/mL). A negative result must be combined with clinical observations, patient history, and epidemiological information. The expected result is Negative.  Fact Sheet for Patients:  EntrepreneurPulse.com.au  Fact Sheet for Healthcare Providers:  IncredibleEmployment.be  This test is no t yet approved or cleared by the [place] FDA and  has been authorized for detection and/or diagnosis of SARS-CoV-2 by FDA under an Emergency Use Authorization (EUA). This EUA will remain  in effect (meaning this test can be used) for the duration of the COVID-19 declaration under Section 564(b)(1) of the Act, 21 U.S.C.section 360bbb-3(b)(1), unless the authorization is terminated  or revoked sooner.       Influenza A by PCR NEGATIVE NEGATIVE Final   Influenza B  by PCR NEGATIVE NEGATIVE Final    Comment: (NOTE) The Xpert Xpress SARS-CoV-2/FLU/RSV plus assay is intended as an aid in the diagnosis of influenza from Nasopharyngeal swab specimens and should not be used as a sole basis for treatment. Nasal washings and aspirates are unacceptable for Xpert Xpress SARS-CoV-2/FLU/RSV testing.  Fact Sheet for Patients: EntrepreneurPulse.com.au  Fact Sheet for Healthcare Providers: IncredibleEmployment.be  This test is not yet approved or cleared by the [place] FDA and has been authorized for detection and/or diagnosis of SARS-CoV-2 by FDA under an Emergency Use Authorization (EUA). This EUA will remain in effect (meaning this test can be used) for the duration of the COVID-19 declaration under Section 564(b)(1) of the Act, 21 U.S.C. section 360bbb-3(b)(1), unless the authorization is terminated or revoked.  Performed at [facility], [place] [address] [place], [place] [address]          Radiology Studies: DG Chest 2 View  Result Date: 07/06/2020 CLINICAL DATA:  65 year old female with shortness of breath. EXAM: CHEST - 2 VIEW COMPARISON:  Chest radiograph dated 06/07/2020. FINDINGS: Mild cardiomegaly with mild vascular congestion. There is a small right pleural effusion with right lung base atelectasis. Pneumonia is not excluded clinical correlation is recommended. No pneumothorax. Atherosclerotic calcification of the aorta. No acute osseous pathology. IMPRESSION: Cardiomegaly with mild vascular congestion and a small right pleural effusion. Pneumonia is not excluded. Electronically Signed   By: Anner Crete M.D.   On: 07/06/2020 17:23   US RENAL  Result Date: 07/06/2020 CLINICAL DATA:  Acute on chronic renal failure, hypertension, diabetes EXAM: RENAL / URINARY TRACT ULTRASOUND COMPLETE COMPARISON:  06/10/2020 FINDINGS: Right Kidney: Renal measurements: 11.0 x 5.3 by 5.8 cm =  volume: 177.3 mL. There is mild increased renal cortical echotexture, stable. No hydronephrosis or nephrolithiasis. No renal mass. Left Kidney: Renal measurements: 11.8 x 6.1 x 5.9 cm = volume: 224.1 mL. There is mild increased renal cortical echotexture, stable. No hydronephrosis or nephrolithiasis. 1.1 cm cysts ventral cortex mid left kidney. Bladder: The bladder is decompressed which limits its evaluation. Other: None. IMPRESSION: 1. Stable mild increased renal cortical echotexture consistent with medical renal disease. Electronically Signed   By: Randa Ngo M.D.   On: 07/06/2020 21:12      Scheduled Meds: . amiodarone  200 mg Oral Daily  . [START ON 07/09/2020] amLODipine  5 mg Oral Daily  . atorvastatin  40 mg Oral Daily  . carvedilol  12.5 mg Oral BID  .  cloNIDine  0.1 mg Oral BID  . darbepoetin (ARANESP) injection - NON-DIALYSIS  150 mcg Subcutaneous Q Sat-1800  . [START ON 07/09/2020] doxazosin  4 mg Oral Daily  . furosemide  80 mg Intravenous Q8H  . hydrALAZINE  100 mg Oral TID  . insulin aspart  0-9 Units Subcutaneous TID WC  . levothyroxine  125 mcg Oral Q0600  . melatonin  5 mg Oral QHS  . sildenafil  20 mg Oral TID  . sodium chloride flush  3 mL Intravenous Q12H   Continuous Infusions:   LOS: 2 days      Debbe Odea, MD [facility] Pager: [email or web address] 07/08/2020, 11:59 AM

## 2020-07-08 NOTE — Progress Notes (Signed)
After scanning the aranesp, patient declined as she stated she receives it at the cancer center every 3 weeks, and she just received one. Physician notified. Also, new order for trazodone received at bedtime.

## 2020-07-08 NOTE — Plan of Care (Signed)

## 2020-07-09 DIAGNOSIS — E871 Hypo-osmolality and hyponatremia: Secondary | ICD-10-CM | POA: Diagnosis not present

## 2020-07-09 DIAGNOSIS — D469 Myelodysplastic syndrome, unspecified: Secondary | ICD-10-CM

## 2020-07-09 DIAGNOSIS — E038 Other specified hypothyroidism: Secondary | ICD-10-CM

## 2020-07-09 DIAGNOSIS — I272 Pulmonary hypertension, unspecified: Secondary | ICD-10-CM

## 2020-07-09 DIAGNOSIS — I10 Essential (primary) hypertension: Secondary | ICD-10-CM | POA: Diagnosis not present

## 2020-07-09 DIAGNOSIS — I5033 Acute on chronic diastolic (congestive) heart failure: Secondary | ICD-10-CM

## 2020-07-09 DIAGNOSIS — N179 Acute kidney failure, unspecified: Secondary | ICD-10-CM | POA: Diagnosis not present

## 2020-07-09 DIAGNOSIS — I480 Paroxysmal atrial fibrillation: Secondary | ICD-10-CM

## 2020-07-09 DIAGNOSIS — E119 Type 2 diabetes mellitus without complications: Secondary | ICD-10-CM

## 2020-07-09 LAB — RENAL FUNCTION PANEL
Albumin: 3.3 g/dL — ABNORMAL LOW (ref 3.5–5.0)
Anion gap: 11 (ref 5–15)
BUN: 140 mg/dL — ABNORMAL HIGH (ref 8–23)
CO2: 23 mmol/L (ref 22–32)
Calcium: 9.4 mg/dL (ref 8.9–10.3)
Chloride: 102 mmol/L (ref 98–111)
Creatinine, Ser: 4.97 mg/dL — ABNORMAL HIGH (ref 0.44–1.00)
GFR, Estimated: 9 mL/min — ABNORMAL LOW (ref >60)
Glucose, Bld: 157 mg/dL — ABNORMAL HIGH (ref 70–99)
Phosphorus: 4.4 mg/dL (ref 2.5–4.6)
Potassium: 3.8 mmol/L (ref 3.5–5.1)
Sodium: 136 mmol/L (ref 135–145)

## 2020-07-09 LAB — GLUCOSE, CAPILLARY
Glucose-Capillary: 151 mg/dL — ABNORMAL HIGH (ref 70–99)
Glucose-Capillary: 170 mg/dL — ABNORMAL HIGH (ref 70–99)

## 2020-07-09 LAB — IRON AND TIBC
Iron: 27 ug/dL — ABNORMAL LOW (ref 28–170)
Saturation Ratios: 13 % (ref 10.4–31.8)
TIBC: 214 ug/dL — ABNORMAL LOW (ref 250–450)
UIBC: 187 ug/dL

## 2020-07-09 LAB — FERRITIN: Ferritin: 1549 ng/mL — ABNORMAL HIGH (ref 11–307)

## 2020-07-09 MED ORDER — LATANOPROST 0.005 % OP SOLN
1.0000 [drp] | Freq: Every day | OPHTHALMIC | Status: DC
  Filled 2020-07-09: qty 2.5

## 2020-07-09 MED ORDER — FUROSEMIDE 40 MG PO TABS: 40.0000 mg | ORAL_TABLET | Freq: Every day | ORAL | 0 refills | Status: DC

## 2020-07-09 NOTE — Discharge Instructions (Signed)
Food Basics for Chronic Kidney Disease When your kidneys are not working well, they cannot remove waste and excess substances from your blood as effectively as they did before. This can lead to a buildup and imbalance of these substances, which can worsen kidney damage and affect how your body functions. Certain foods lead to a buildup of these substances in the body. By changing your diet as recommended by your diet and nutrition specialist (dietitian) or health care provider, you could help prevent further kidney damage and delay or prevent the need for dialysis. What are tips for following this plan? General instructions   Work with your health care provider and dietitian to develop a meal plan that is right for you. Foods you can eat, limit, or avoid will be different for each person depending on the stage of kidney disease and any other existing health conditions.  Talk with your health care provider about whether you should take a vitamin and mineral supplement.  Use standard measuring cups and spoons to measure servings of foods. Use a kitchen scale to measure portions of protein foods.  If directed by your health care provider, avoid drinking too much fluid. Measure and count all liquids, including water, ice, soups, flavored gelatin, and frozen desserts such as popsicles or ice cream. Reading food labels  Check the amount of sodium in foods. Choose foods that have less than 300 milligrams (mg) per serving.  Check the ingredient list for phosphorus or potassium-based additives or preservatives.  Check the amount of saturated and trans fat. Limit or avoid these fats as told by your dietitian. Shopping  Avoid buying foods that are: ? Processed, frozen, or prepackaged. ? Calcium-enriched or fortified.  Do not buy foods that have salt or sodium listed among the first five ingredients.  Do not buy canned vegetables. Cooking  Replace animal proteins, such as meat, fish, eggs, or  dairy, with plant proteins from beans, nuts, and soy. ? Use soy milk instead of cow's milk. ? Add beans or tofu to soups, casseroles, or pasta dishes instead of meat.  Soak vegetables, such as potatoes, before cooking to reduce potassium. To do this: ? Peel and cut into small pieces. ? Soak in warm water for at least 2 hours. For every 1 cup of vegetables, use 10 cups of water. ? Drain and rinse with warm water. ? Boil for at least 5 minutes. Meal planning  Limit the amount of protein from plant and animal sources you eat each day.  Do not add salt to food when cooking or before eating.  Eat meals and snacks at around the same time each day. If you have diabetes:  If you have diabetes (diabetes mellitus) and chronic kidney disease, it is important to keep your blood glucose in the target range recommended by your health care provider. Follow your diabetes management plan. This may include: ? Checking your blood glucose regularly. ? Taking oral medicines, insulin, or both. ? Exercising for at least 30 minutes on 5 or more days each week, or as told by your health care provider. ? Tracking how many servings of carbohydrates you eat at each meal.  You may be given specific guidelines on how much of certain foods and nutrients you may eat, depending on your stage of kidney disease and whether you have high blood pressure (hypertension). Follow your meal plan as told by your dietitian. What nutrients should be limited? The items listed are not a complete list. Talk with your dietitian   about what dietary choices are best for you. Potassium Potassium affects how steadily your heart beats. If too much potassium builds up in your blood, it can cause an irregular heartbeat or even a heart attack. You may need to eat less potassium, depending on your blood potassium levels and the stage of kidney disease. Talk to your dietitian about how much potassium you may have each day. You may need to limit  or avoid foods that are high in potassium, such as:  Milk and soy milk.  Fruits, such as bananas, papaya, apricots, nectarines, melon, prunes, raisins, kiwi, and oranges.  Vegetables, such as potatoes, sweet potatoes, yams, tomatoes, leafy greens, beets, okra, avocado, pumpkin, and winter squash.  White and lima beans. Phosphorus Phosphorus is a mineral found in your bones. A balance between calcium and phosphorous is needed to build and maintain healthy bones. Too much phosphorus pulls calcium from your bones. This can make your bones weak and more likely to break. Too much phosphorus can also make your skin itch. You may need to eat less phosphorus depending on your blood phosphorus levels and the stage of kidney disease. Talk to your dietitian about how much potassium you may have each day. You may need to take medicine to lower your blood phosphorus levels if diet changes do not help. You may need to limit or avoid foods that are high in phosphorus, such as:  Milk and dairy products.  Dried beans and peas.  Tofu, soy milk, and other soy-based meat replacements.  Colas.  Nuts and peanut butter.  Meat, poultry, and fish.  Bran cereals and oatmeals. Protein Protein helps you to make and keep muscle. It also helps in the repair of your body's cells and tissues. One of the natural breakdown products of protein is a waste product called urea. When your kidneys are not working properly, they cannot remove wastes, such as urea, like they did before you developed chronic kidney disease. Reducing how much protein you eat can help prevent a buildup of urea in your blood. Depending on your stage of kidney disease, you may need to limit foods that are high in protein. Sources of animal protein include:  Meat (all types).  Fish and seafood.  Poultry.  Eggs.  Dairy. Other protein foods include:  Beans and legumes.  Nuts and nut butter.  Soy and tofu. Sodium Sodium, which is found  in salt, helps maintain a healthy balance of fluids in your body. Too much sodium can increase your blood pressure and have a negative effect on the function of your heart and lungs. Too much sodium can also cause your body to retain too much fluid, making your kidneys work harder. Most people should have less than 2,300 milligrams (mg) of sodium each day. If you have hypertension, you may need to limit your sodium to 1,500 mg each day. Talk to your dietitian about how much sodium you may have each day. You may need to limit or avoid foods that are high in sodium, such as:  Salt seasonings.  Soy sauce.  Cured and processed meats.  Salted crackers and snack foods.  Fast food.  Canned soups and most canned foods.  Pickled foods.  Vegetable juice.  Boxed mixes or ready-to-eat boxed meals and side dishes.  Bottled dressings, sauces, and marinades. Summary  Chronic kidney disease can lead to a buildup and imbalance of waste and excess substances in the body. Certain foods lead to a buildup of these substances. By adjusting   your intake of these foods, you could help prevent more kidney damage and delay or prevent the need for dialysis.  Food adjustments are different for each person with chronic kidney disease. Work with a dietitian to set up nutrient goals and a meal plan that is right for you.  If you have diabetes and chronic kidney disease, it is important to keep your blood glucose in the target range recommended by your health care provider. This information is not intended to replace advice given to you by your health care provider. Make sure you discuss any questions you have with your health care provider. Document Revised: 10/22/2018 Document Reviewed: 06/26/2016 Elsevier Patient Education  2020 Elsevier Inc.  

## 2020-07-09 NOTE — Progress Notes (Signed)
Subjective:  2.6 liters of UOP on lasix 80 q8-  Negative 1.7 liters, overall down 6 liters, weight down to 221- crt actually down a little- BUN also - BP stable at 202 systolic- not uremic in the least  Objective Vital signs in last 24 hours: Vitals:   07/08/20 1930 07/08/20 2214 07/09/20 0425 07/09/20 0819  BP: (!) 127/42 (!) 139/31 (!) 119/45 (!) 121/42  Pulse: (!) 58 61 (!) 58 (!) 57  Resp: _0 Temp: 98.4 F (36.9 C)  98.1 F (36.7 C) 97.9 F (36.6 C)  TempSrc: Oral  Oral Oral  SpO2: 97%  96% 100%  Weight:   100.4 kg   Height:       Weight change: -1.678 kg  Intake/Output Summary (Last 24 hours) at 07/09/2020 1036 Last data filed at 07/09/2020 0427 Gross per 24 hour  Intake 760 ml  Output 2600 ml  Net -1840 ml    Assessment/ Plan: Pt is a 64 y.o. yo female with many medical issues including pulm HTN and advanced CKD- crt 3.5 at baseline who was admitted on 07/06/2020 with volume overload   Assessment/Plan  1.  AKI on CKD IV: Cr 06/11/20 3.42.  Unfortunately has been rising to 4.3 (12/8) --> 5.42 (12/16) in the setting of volume overload.  She is azotemic but not uremic.  UA pretty bland.   Thru the course of hosp has diuresed successfully and  BUN and crt now trending down-- I attempted to communicate to her that her numbers are not good and that we need to think about and make plans for dialysis in the future  BUT albumin is good and I cannot get a uremic sxm out of her and she is VERY resistent.  Will be difficult to convince her that dialysis is likely the very best/most efficient way to handle this as opposed to frequent hospitalizations and titrations of diuretics.  Is stable for now, I think OK for discharge and will communicate to Dr. Carolin Sicks and arrange follow up.  OP dosing of lasix will be 80 mg in the AM and 40 mg in the PM  2.  Acute on chronic diastolic CHF: on BB, no ACEi/ARB, spironolactone, SGLT2i d/t low eGFR.  Followed by Dr Haroldine Laws.  Diuresed a  fair bit- says her target weight is in the 210's-  I  decreased norvasc to 5 daily  and cardura to 4 daily as I dont want her BP to drop and give [place] ATN- may be able to titrate down further as OP  3.  Severe AS: noted on TTE 05/2020.    4.  Afib: on amiodarone, no AC  5.  PHTN: on Revatio  6.  MDS: will check iron stores and give ESA- she refused as is treated at the cancer center   7. Hyponatremia-  Due to volume overload-  Better with diuresis      Louis Meckel    Labs: Basic Metabolic Panel: Recent Labs  Lab 07/07/20 0138 07/08/20 1010 07/09/20 0357  NA 132* 133* 136  K 4.5 3.9 3.8  CL 100 100 102  CO2 19* 19* 23  GLUCOSE 167* 243* 157*  BUN 152* 144* 140*  CREATININE 6.26* 5.33* 4.97*  CALCIUM 9.0 9.2 9.4  PHOS  --   --  4.4   Liver Function Tests: Recent Labs  Lab 07/06/20 1733 07/09/20 0357  AST 28  --   ALT 16  --   ALKPHOS 64  --  BILITOT 1.2  --   PROT 7.2  --   ALBUMIN 3.9 3.3*   No results for input(s): LIPASE, AMYLASE in the last 168 hours. No results for input(s): AMMONIA in the last 168 hours. CBC: Recent Labs  Lab 07/06/20 1733 07/07/20 0138  WBC 4.3 5.2  NEUTROABS 3.1  --   HGB 8.8* 8.4*  HCT 25.5* 24.5*  MCV 92.1 90.1  PLT 100* 100*   Cardiac Enzymes: No results for input(s): CKTOTAL, CKMB, CKMBINDEX, TROPONINI in the last 168 hours. CBG: Recent Labs  Lab 07/08/20 0652 07/08/20 1135 07/08/20 1605 07/08/20 2121 07/09/20 0604  GLUCAP 147* 209* 175* 182* 151*    Iron Studies:  Recent Labs    07/09/20 0357  IRON 27*  TIBC 214*  FERRITIN 1,549*   Studies/Results: No results found. Medications: Infusions:   Scheduled Medications: . amiodarone  200 mg Oral Daily  . amLODipine  5 mg Oral Daily  . atorvastatin  40 mg Oral Daily  . carvedilol  12.5 mg Oral BID  . cloNIDine  0.1 mg Oral BID  . darbepoetin (ARANESP) injection - NON-DIALYSIS  150 mcg Subcutaneous Q Sat-1800  . doxazosin  4 mg Oral Daily   . furosemide  80 mg Intravenous Q8H  . hydrALAZINE  100 mg Oral TID  . insulin aspart  0-9 Units Subcutaneous TID WC  . latanoprost  1 drop Both Eyes QHS  . levothyroxine  125 mcg Oral Q0600  . melatonin  5 mg Oral QHS  . polyethylene glycol  17 g Oral BID  . sildenafil  20 mg Oral TID  . sodium chloride flush  3 mL Intravenous Q12H  . traZODone  50 mg Oral QHS    have reviewed scheduled and prn medications.  Physical Exam: General: alert, friendly- reports feeling better Heart: RRR Lungs: mostly clear Abdomen: soft, non tender Extremities: slight pitting edema still     07/09/2020,10:36 AM  LOS: 3 days

## 2020-07-09 NOTE — TOC Transition Note (Signed)
Transition of Care [facility]) - CM/SW Discharge Note   Patient Details  Name: Patricia Mata MRN: [id number] Date of Birth: February 06, 1956  [facility] CM/SW Contact:  Carles Collet, RN Phone Number: 07/09/2020, 12:57 PM   Clinical Narrative:   HF HH consult. Spoke w patient. Discovered that she was discharged last month w [facility] services. Alvis Lemmings able to resume care, but patient declined. No other CM needs identified.           Patient Goals and CMS Choice        Discharge Placement                       Discharge Plan and Services                                     Social Determinants of Health (SDOH) Interventions     Readmission Risk Interventions No flowsheet data found.

## 2020-07-09 NOTE — Plan of Care (Signed)

## 2020-07-09 NOTE — Discharge Summary (Signed)
Physician Discharge Summary  Aracelly Tencza TWS:[id number] DOB: 19-Mar-1956 DOA: 07/06/2020  PCP: [name], FNP  Admit date: 07/06/2020 Discharge date: 07/09/2020  Admitted From: home Disposition:  home   Recommendations for Outpatient Follow-up:  1. F/u renal function and BP as adjustments in meds have been made  Home Health:  none  Discharge Condition:  stable   CODE STATUS:  Full code   Consultations:  nephrology  Procedures/Studies: . none   Discharge Diagnoses:  Principal Problem:   Acute renal failure superimposed on stage 4 chronic kidney disease ([place]) Active Problems:    Hyponatremia   Acute on chronic diastolic CHF (congestive heart failure) (HCC)   MDS (myelodysplastic syndrome) ([place])   Essential hypertension   Hyperlipidemia   PAF (paroxysmal atrial fibrillation) ([place])   Type 2 diabetes mellitus with hemoglobin A1c goal of less than 7.0% ([place])   Hypothyroidism   Pulmonary hypertension ([place])      Brief Summary: Patricia Mata is a 64 y.o.femalewith medical history significant forCKD stage IV, MDS with transfusion dependent anemia, chronic diastolic CHF, paroxysmal atrial fibrillation on amiodarone (not on anticoagulation due to anemia),PAH,severe AS, renal artery stenosis, carotid artery stenosis, hypertension, hyperlipidemia, hypothyroidism who presents to the ED for evaluation of worsening renal function and lower extremity edema. She was hospitalized from 06/07/2020-06/11/2020 for acute on chronic CHF. She is diuresed with IV Lasix. She admits to steadily gaining weight since the last admission despite increasing lasix to 22m BID per her nephrologist's advice.   In ED > Cr 6.45, sodium 130  CXR > mild vascular congestion and small right pleural effusion  Hospital Course:  Principal Problem:   Acute renal failure superimposed on stage 4 chronic kidney disease   Acute on chronic diastolic CHF  Hyponatremia - treated with 80 mg IV  Lasix TID- she has diuresed well and ha clinically improved - Cr has improved to 4.97  from 6.45 - Sodium is steadily rising and is now 136 - no signs of uremia  Active Problems:   MDS (myelodysplastic syndrome) - anemia and thrombocytopenia noted one exam - apparently is dependent on blood transfusions    Essential hypertension and renal artery stenosis - home meds> Amlodipine, Coreg, Catapres, Cardura, Hydralazine - nephrology recommends stopping Amlodipine and Cardura for now to prevent hypotension/ ATN in the future    Pulmonary hypertension   - cont Revatio    Hyperlipidemia - Lipitor    PAF (paroxysmal atrial fibrillation) - cont Amiodarone at 200 mg daily - she is not on AC    Type 2 diabetes mellitus with hemoglobin A1c goal of less than 7.0%  - last A1c was 6.0 in 10/21 - Glargine and Linagliptan on hold - sugars stable for now- follow - cont SSI   Hypothyroidism - Cont Synthroid    Discharge Exam: Vitals:   07/09/20 0425 07/09/20 0819  BP: (!) 119/45 (!) 121/42  Pulse: (!) 58 (!) 57  Resp: 18 20  Temp: 98.1 F (36.7 C) 97.9 F (36.6 C)  SpO2: 96% 100%   Vitals:   07/08/20 1930 07/08/20 2214 07/09/20 0425 07/09/20 0819  BP: (!) 127/42 (!) 139/31 (!) 119/45 (!) 121/42  Pulse: (!) 58 61 (!) 58 (!) 57  Resp: _0 Temp: 98.4 F (36.9 C)  98.1 F (36.7 C) 97.9 F (36.6 C)  TempSrc: Oral  Oral Oral  SpO2: 97%  96% 100%  Weight:   100.4 kg   Height:  General: Pt is alert, awake, not in acute distress Cardiovascular: RRR, S1/S2 +, no rubs, no gallops Respiratory: CTA bilaterally, no wheezing, no rhonchi Abdominal: Soft, NT, ND, bowel sounds + Extremities: no edema, no cyanosis   Discharge Instructions  Discharge Instructions    Diet - low sodium heart healthy   Complete by: As directed    Renal diet   Increase activity slowly   Complete by: As directed      Allergies as of 07/09/2020      Reactions   Ancef [cefazolin]  Itching   Severe itching- after procedure, ancef was the antibiotic.-04/02/17 Tolerates penicillins   Lactose Intolerance (gi) Diarrhea   Sulfa Antibiotics Rash, Other (See Comments)   Blisters, also      Medication List    STOP taking these medications   amLODipine 10 MG tablet Commonly known as: NORVASC   doxazosin 8 MG tablet Commonly known as: CARDURA     TAKE these medications   allopurinol 300 MG tablet Commonly known as: ZYLOPRIM Take 300 mg by mouth daily. What changed: Another medication with the same name was removed. Continue taking this medication, and follow the directions you see here.   amiodarone 200 MG tablet Commonly known as: PACERONE Take 1 tablet (200 mg total) by mouth daily.   atorvastatin 40 MG tablet Commonly known as: LIPITOR Take 1 tablet (40 mg total) by mouth daily.   Basaglar KwikPen 100 UNIT/ML Inject 0.14 mLs (14 Units total) into the skin daily. What changed: how much to take   blood glucose meter kit and supplies Dispense based on patient and insurance preference. Use up to four times daily as directed. (FOR ICD-10 E10.9, E11.9). What changed:   how much to take  how to take this  when to take this   carvedilol 12.5 MG tablet Commonly known as: COREG Take 1 tablet (12.5 mg total) by mouth 2 (two) times daily.   Centrum Silver 50+Women Tabs Take 1 tablet by mouth daily.   cloNIDine 0.1 MG tablet Commonly known as: Catapres Take 1 tablet (0.1 mg total) by mouth 2 (two) times daily.   fluticasone 50 MCG/ACT nasal spray Commonly known as: FLONASE Place 1 spray into both nostrils daily.   furosemide 40 MG tablet Commonly known as: LASIX Take 1 tablet (40 mg total) by mouth daily. Take 80 mg in AM and 40 mg in PM (4PM) What changed: additional instructions   glucose blood test strip check blood sugars twice daily Dx code E11.22 What changed:   how much to take  how to take this  when to take this   hydrALAZINE 100  MG tablet Commonly known as: APRESOLINE Take 1 tablet (100 mg total) by mouth 3 (three) times daily.   Insulin Pen Needle 29G X 5MM Misc Use as directed What changed:   how much to take  how to take this  when to take this   latanoprost 0.005 % ophthalmic solution Commonly known as: XALATAN Place 1 drop into both eyes at bedtime.   levothyroxine 125 MCG tablet Commonly known as: Synthroid Take 1 tablet (125 mcg total) by mouth daily.   linagliptin 5 MG Tabs tablet Commonly known as: Tradjenta Take 1 tablet (5 mg total) by mouth daily.   loratadine 10 MG tablet Commonly known as: CLARITIN Take 10 mg by mouth daily.   OneTouch Delica Plus [id number] Misc check blood sugars twice daily Dx code: E11.22 What changed:   how much to take  how  to take this  when to take this   OneTouch UltraLink w/Device Kit Check blood sugars twice daily E11.9 What changed:   how much to take  how to take this  when to take this   sildenafil 20 MG tablet Commonly known as: REVATIO TAKE 1 TABLET BY MOUTH THREE TIMES DAILY   sodium bicarbonate 650 MG tablet Take 650 mg by mouth 2 (two) times daily.       Allergies  Allergen Reactions  . Ancef [Cefazolin] Itching    Severe itching- after procedure, ancef was the antibiotic.-04/02/17 Tolerates penicillins  . Lactose Intolerance (Gi) Diarrhea  . Sulfa Antibiotics Rash and Other (See Comments)    Blisters, also      DG Chest 2 View  Result Date: 07/06/2020 CLINICAL DATA:  64 year old female with shortness of breath. EXAM: CHEST - 2 VIEW COMPARISON:  Chest radiograph dated 06/07/2020. FINDINGS: Mild cardiomegaly with mild vascular congestion. There is a small right pleural effusion with right lung base atelectasis. Pneumonia is not excluded clinical correlation is recommended. No pneumothorax. Atherosclerotic calcification of the aorta. No acute osseous pathology. IMPRESSION: Cardiomegaly with mild vascular congestion and a  small right pleural effusion. Pneumonia is not excluded. Electronically Signed   By: Anner Crete M.D.   On: 07/06/2020 17:23   US RENAL  Result Date: 07/06/2020 CLINICAL DATA:  Acute on chronic renal failure, hypertension, diabetes EXAM: RENAL / URINARY TRACT ULTRASOUND COMPLETE COMPARISON:  06/10/2020 FINDINGS: Right Kidney: Renal measurements: 11.0 x 5.3 by 5.8 cm = volume: 177.3 mL. There is mild increased renal cortical echotexture, stable. No hydronephrosis or nephrolithiasis. No renal mass. Left Kidney: Renal measurements: 11.8 x 6.1 x 5.9 cm = volume: 224.1 mL. There is mild increased renal cortical echotexture, stable. No hydronephrosis or nephrolithiasis. 1.1 cm cysts ventral cortex mid left kidney. Bladder: The bladder is decompressed which limits its evaluation. Other: None. IMPRESSION: 1. Stable mild increased renal cortical echotexture consistent with medical renal disease. Electronically Signed   By: Randa Ngo M.D.   On: 07/06/2020 21:12   US RENAL  Result Date: 06/10/2020 CLINICAL DATA:  Acute renal injury. EXAM: RENAL / URINARY TRACT ULTRASOUND COMPLETE COMPARISON:  None. FINDINGS: Right Kidney: Renal measurements: 10.9 cm x 5.3 cm x 5.7 cm = volume: 170.1 mL. There is diffusely increased echogenicity of the renal parenchyma. No mass or hydronephrosis visualized. Left Kidney: Renal measurements: 12.7 cm x 5.2 cm x 5.2 cm = volume: 177.7 mL. There is diffusely increased echogenicity of the renal parenchyma. A 0.9 cm x 0.8 cm x 0.9 cm anechoic structure is seen within the mid left kidney. No flow is seen within this region on color Doppler evaluation. No hydronephrosis is visualized. Bladder: Appears normal for degree of bladder distention. Bilateral ureteral jets are seen. Other: None. IMPRESSION: 1. Bilateral increased renal echogenicity which is likely secondary to medical renal disease. 2. Very small left renal cyst. Electronically Signed   By: Virgina Norfolk M.D.   On:  06/10/2020 16:47     The results of significant diagnostics from this hospitalization (including imaging, microbiology, ancillary and laboratory) are listed below for reference.     Microbiology: Recent Results (from the past 240 hour(s))  Resp Panel by RT-PCR (Flu A&B, Covid) Nasopharyngeal Swab     Status: None   Collection Time: 07/06/20  7:26 PM   Specimen: Nasopharyngeal Swab; Nasopharyngeal(NP) swabs in vial transport medium  Result Value Ref Range Status   SARS Coronavirus 2 by RT PCR NEGATIVE  NEGATIVE Final    Comment: (NOTE) SARS-CoV-2 target nucleic acids are NOT DETECTED.  The SARS-CoV-2 RNA is generally detectable in upper respiratory specimens during the acute phase of infection. The lowest concentration of SARS-CoV-2 viral copies this assay can detect is 138 copies/mL. A negative result does not preclude SARS-Cov-2 infection and should not be used as the sole basis for treatment or other patient management decisions. A negative result may occur with  improper specimen collection/handling, submission of specimen other than nasopharyngeal swab, presence of viral mutation(s) within the areas targeted by this assay, and inadequate number of viral copies(<138 copies/mL). A negative result must be combined with clinical observations, patient history, and epidemiological information. The expected result is Negative.  Fact Sheet for Patients:  EntrepreneurPulse.com.au  Fact Sheet for Healthcare Providers:  IncredibleEmployment.be  This test is no t yet approved or cleared by the [place] FDA and  has been authorized for detection and/or diagnosis of SARS-CoV-2 by FDA under an Emergency Use Authorization (EUA). This EUA will remain  in effect (meaning this test can be used) for the duration of the COVID-19 declaration under Section 564(b)(1) of the Act, 21 U.S.C.section 360bbb-3(b)(1), unless the authorization is terminated  or  revoked sooner.       Influenza A by PCR NEGATIVE NEGATIVE Final   Influenza B by PCR NEGATIVE NEGATIVE Final    Comment: (NOTE) The Xpert Xpress SARS-CoV-2/FLU/RSV plus assay is intended as an aid in the diagnosis of influenza from Nasopharyngeal swab specimens and should not be used as a sole basis for treatment. Nasal washings and aspirates are unacceptable for Xpert Xpress SARS-CoV-2/FLU/RSV testing.  Fact Sheet for Patients: EntrepreneurPulse.com.au  Fact Sheet for Healthcare Providers: IncredibleEmployment.be  This test is not yet approved or cleared by the [place] FDA and has been authorized for detection and/or diagnosis of SARS-CoV-2 by FDA under an Emergency Use Authorization (EUA). This EUA will remain in effect (meaning this test can be used) for the duration of the COVID-19 declaration under Section 564(b)(1) of the Act, 21 U.S.C. section 360bbb-3(b)(1), unless the authorization is terminated or revoked.  Performed at [facility], [place] [address] [place], [place] [address]      Labs: BNP (last 3 results) Recent Labs    09/20/19 1823 06/07/20 0826 07/06/20 1733  BNP 548.2* 489.2* 989.2*   Basic Metabolic Panel: Recent Labs  Lab 07/06/20 1733 07/07/20 0138 07/08/20 1010 07/09/20 0357  NA 130* 132* 133* 136  K 4.8 4.5 3.9 3.8  CL 98 100 100 102  CO2 15* 19* 19* 23  GLUCOSE 100* 167* 243* 157*  BUN 134* 152* 144* 140*  CREATININE 6.45* 6.26* 5.33* 4.97*  CALCIUM 9.2 9.0 9.2 9.4  MG  --  1.9  --   --   PHOS  --   --   --  4.4   Liver Function Tests: Recent Labs  Lab 07/06/20 1733 07/09/20 0357  AST 28  --   ALT 16  --   ALKPHOS 64  --   BILITOT 1.2  --   PROT 7.2  --   ALBUMIN 3.9 3.3*   No results for input(s): LIPASE, AMYLASE in the last 168 hours. No results for input(s): AMMONIA in the last 168 hours. CBC: Recent Labs  Lab 07/06/20 1733 07/07/20 0138  WBC 4.3 5.2   NEUTROABS 3.1  --   HGB 8.8* 8.4*  HCT 25.5* 24.5*  MCV 92.1 90.1  PLT 100* 100*   Cardiac Enzymes:  No results for input(s): CKTOTAL, CKMB, CKMBINDEX, TROPONINI in the last 168 hours. BNP: Invalid input(s): POCBNP CBG: Recent Labs  Lab 07/08/20 0652 07/08/20 1135 07/08/20 1605 07/08/20 2121 07/09/20 0604  GLUCAP 147* 209* 175* 182* 151*   D-Dimer No results for input(s): DDIMER in the last 72 hours. Hgb A1c No results for input(s): HGBA1C in the last 72 hours. Lipid Profile No results for input(s): CHOL, HDL, LDLCALC, TRIG, CHOLHDL, LDLDIRECT in the last 72 hours. Thyroid function studies No results for input(s): TSH, T4TOTAL, T3FREE, THYROIDAB in the last 72 hours.  Invalid input(s): FREET3 Anemia work up Recent Labs    07/09/20 0357  FERRITIN 1,549*  TIBC 214*  IRON 27*   Urinalysis    Component Value Date/Time   COLORURINE YELLOW 07/06/2020 1706   APPEARANCEUR HAZY (A) 07/06/2020 1706   LABSPEC 1.008 07/06/2020 1706   PHURINE 5.0 07/06/2020 1706   GLUCOSEU NEGATIVE 07/06/2020 1706   HGBUR NEGATIVE 07/06/2020 1706   BILIRUBINUR NEGATIVE 07/06/2020 1706   BILIRUBINUR negative 05/11/2020 1152   KETONESUR NEGATIVE 07/06/2020 1706   PROTEINUR 100 (A) 07/06/2020 1706   UROBILINOGEN 0.2 05/11/2020 1152   NITRITE NEGATIVE 07/06/2020 1706   LEUKOCYTESUR SMALL (A) 07/06/2020 1706   Sepsis Labs Invalid input(s): PROCALCITONIN,  WBC,  LACTICIDVEN Microbiology Recent Results (from the past 240 hour(s))  Resp Panel by RT-PCR (Flu A&B, Covid) Nasopharyngeal Swab     Status: None   Collection Time: 07/06/20  7:26 PM   Specimen: Nasopharyngeal Swab; Nasopharyngeal(NP) swabs in vial transport medium  Result Value Ref Range Status   SARS Coronavirus 2 by RT PCR NEGATIVE NEGATIVE Final    Comment: (NOTE) SARS-CoV-2 target nucleic acids are NOT DETECTED.  The SARS-CoV-2 RNA is generally detectable in upper respiratory specimens during the acute phase of infection.  The lowest concentration of SARS-CoV-2 viral copies this assay can detect is 138 copies/mL. A negative result does not preclude SARS-Cov-2 infection and should not be used as the sole basis for treatment or other patient management decisions. A negative result may occur with  improper specimen collection/handling, submission of specimen other than nasopharyngeal swab, presence of viral mutation(s) within the areas targeted by this assay, and inadequate number of viral copies(<138 copies/mL). A negative result must be combined with clinical observations, patient history, and epidemiological information. The expected result is Negative.  Fact Sheet for Patients:  EntrepreneurPulse.com.au  Fact Sheet for Healthcare Providers:  IncredibleEmployment.be  This test is no t yet approved or cleared by the [place] FDA and  has been authorized for detection and/or diagnosis of SARS-CoV-2 by FDA under an Emergency Use Authorization (EUA). This EUA will remain  in effect (meaning this test can be used) for the duration of the COVID-19 declaration under Section 564(b)(1) of the Act, 21 U.S.C.section 360bbb-3(b)(1), unless the authorization is terminated  or revoked sooner.       Influenza A by PCR NEGATIVE NEGATIVE Final   Influenza B by PCR NEGATIVE NEGATIVE Final    Comment: (NOTE) The Xpert Xpress SARS-CoV-2/FLU/RSV plus assay is intended as an aid in the diagnosis of influenza from Nasopharyngeal swab specimens and should not be used as a sole basis for treatment. Nasal washings and aspirates are unacceptable for Xpert Xpress SARS-CoV-2/FLU/RSV testing.  Fact Sheet for Patients: EntrepreneurPulse.com.au  Fact Sheet for Healthcare Providers: IncredibleEmployment.be  This test is not yet approved or cleared by the [place] FDA and has been authorized for detection and/or diagnosis of SARS-CoV-2 by FDA  under  an Emergency Use Authorization (EUA). This EUA will remain in effect (meaning this test can be used) for the duration of the COVID-19 declaration under Section 564(b)(1) of the Act, 21 U.S.C. section 360bbb-3(b)(1), unless the authorization is terminated or revoked.  Performed at [facility], [place] [address], [place], [place] [address]      Time coordinating discharge in minutes: 76  SIGNED:   Debbe Odea, MD  [facility] 07/09/2020, 10:56 AM

## 2020-07-10 ENCOUNTER — Telehealth: Payer: Self-pay

## 2020-07-10 NOTE — Chronic Care Management (AMB) (Signed)
Chronic Care Management Pharmacy Assistant   Name: Patricia Mata  MRN: [id number] DOB: 1956-03-15  Reason for Encounter: Patient Assistance Coordination  PCP : [name], [place]   29/79/8921- New 1941 application filled out for Tradjenta 5 mg with [employer] patient assistance program. Awaiting provider signature. Patient was admitted to [facility] hospital 12/23 - 12/26, will follow up with patient next week to obtain signature and income documentation. Orlando Penner, CPP notified.   Allergies:   Allergies  Allergen Reactions   Ancef [Cefazolin] Itching    Severe itching- after procedure, ancef was the antibiotic.-04/02/17 Tolerates penicillins   Lactose Intolerance (Gi) Diarrhea   Sulfa Antibiotics Rash and Other (See Comments)    Blisters, also    Medications: Outpatient Encounter Medications as of 07/10/2020  Medication Sig   allopurinol (ZYLOPRIM) 300 MG tablet Take 300 mg by mouth daily.   amiodarone (PACERONE) 200 MG tablet Take 1 tablet (200 mg total) by mouth daily.   atorvastatin (LIPITOR) 40 MG tablet Take 1 tablet (40 mg total) by mouth daily.   blood glucose meter kit and supplies Dispense based on patient and insurance preference. Use up to four times daily as directed. (FOR ICD-10 E10.9, E11.9). (Patient taking differently: 1 each by Other route See admin instructions. Dispense based on patient and insurance preference. Use up to four times daily as directed. (FOR ICD-10 E10.9, E11.9).)   Blood Glucose Monitoring Suppl (ONETOUCH ULTRALINK) w/Device KIT Check blood sugars twice daily E11.9 (Patient taking differently: 1 each by Other route See admin instructions. Check blood sugars twice daily E11.9)   carvedilol (COREG) 12.5 MG tablet Take 1 tablet (12.5 mg total) by mouth 2 (two) times daily.   cloNIDine (CATAPRES) 0.1 MG tablet Take 1 tablet (0.1 mg total) by mouth 2 (two) times daily.   fluticasone (FLONASE) 50 MCG/ACT nasal spray Place 1 spray  into both nostrils daily.   furosemide (LASIX) 40 MG tablet Take 1 tablet (40 mg total) by mouth daily. Take 80 mg in AM and 40 mg in PM (4PM)   glucose blood test strip check blood sugars twice daily Dx code E11.22 (Patient taking differently: 1 each by Other route See admin instructions. check blood sugars twice daily Dx code E11.22)   hydrALAZINE (APRESOLINE) 100 MG tablet Take 1 tablet (100 mg total) by mouth 3 (three) times daily.   Insulin Glargine (BASAGLAR KWIKPEN) 100 UNIT/ML Inject 0.14 mLs (14 Units total) into the skin daily. (Patient taking differently: Inject 18 Units into the skin daily.)   Insulin Pen Needle 29G X 5MM MISC Use as directed (Patient taking differently: 1 each by Other route See admin instructions. Use as directed)   Lancets (ONETOUCH DELICA PLUS [id number]) MISC check blood sugars twice daily Dx code: E11.22 (Patient taking differently: 1 each by Other route See admin instructions. check blood sugars twice daily Dx code: E11.22)   latanoprost (XALATAN) 0.005 % ophthalmic solution Place 1 drop into both eyes at bedtime.    levothyroxine (SYNTHROID) 125 MCG tablet Take 1 tablet (125 mcg total) by mouth daily.   linagliptin (TRADJENTA) 5 MG TABS tablet Take 1 tablet (5 mg total) by mouth daily.   loratadine (CLARITIN) 10 MG tablet Take 10 mg by mouth daily.   Multiple Vitamins-Minerals (CENTRUM SILVER 50+WOMEN) TABS Take 1 tablet by mouth daily.   sildenafil (REVATIO) 20 MG tablet TAKE 1 TABLET BY MOUTH THREE TIMES DAILY (Patient taking differently: Take 20 mg by mouth 3 (three) times daily.)  sodium bicarbonate 650 MG tablet Take 650 mg by mouth 2 (two) times daily.   No facility-administered encounter medications on file as of 07/10/2020.    Current Diagnosis: Patient Active Problem List   Diagnosis Date Noted   Acute renal failure superimposed on stage 4 chronic kidney disease ([place])    Palliative care by specialist    Acute on chronic diastolic  CHF (congestive heart failure) ([place]) 06/07/2020   Chronic cough 05/04/2020   Allergic rhinitis 03/23/2020   Chronic kidney disease, stage 5 ([place]) 02/03/2020   Purulent bronchitis ([place]) 16/43/5391   Acute diastolic CHF (congestive heart failure) ([place]) 09/21/2019   ARDS (adult respiratory distress syndrome) ([place])    Acute respiratory failure with hypoxia ([place]) 09/20/2019   HCAP (healthcare-associated pneumonia) 09/20/2019   Pulmonary hypertension ([place]) 09/20/2019   CKD (chronic kidney disease), stage III ([place]) 09/20/2019   DM2 (diabetes mellitus, type 2) ([place]) 09/20/2019   Elevated troponin 09/20/2019   Severe sepsis ([place]) 09/20/2019   CAP (community acquired pneumonia) 09/14/2019   Hypoxia 09/14/2019   Neutropenia ([place]) 05/25/2019   Malignant neoplasm of colon ([place]) 05/25/2019   Bradycardia 03/21/2019   Pneumonia due to COVID-19 virus 03/18/2019   Type 2 diabetes mellitus with hemoglobin A1c goal of less than 7.0% ([place]) 12/10/2018   Hypothyroidism 12/10/2018   Seasonal allergies 12/10/2018   Gout 09/08/2018   Bacterial infection due to Morganella morganii    Sacral wound 06/08/2018   Iron deficiency anemia due to chronic blood loss 06/08/2018   Hyponatremia 06/08/2018   Constipation 06/08/2018   Encounter for nasogastric (NG) tube placement    Pressure injury of skin 05/22/2018   PAF (paroxysmal atrial fibrillation) ([place]) 05/11/2018   Normocytic anemia 06/14/2017   Aortic stenosis, severe 06/10/2017   Carotid artery disease ([place]) 06/10/2017   Essential hypertension 05/13/2017   Hyperlipidemia 05/13/2017   Bilateral lower extremity edema 05/13/2017   Port-A-Cath in place 04/28/2017   MDS (myelodysplastic syndrome) ([place]) 03/20/2017   Goals of care, counseling/discussion 03/20/2017   Anemia in chronic kidney disease 05/10/2016   Anemia in stage 1 chronic kidney disease 05/10/2016   Anemia due to chronic kidney disease 02/09/2016      Follow-Up:  Patient Assistance Trainer, [place] Pharmacist Assistant [phone]

## 2020-07-11 ENCOUNTER — Telehealth: Payer: Self-pay

## 2020-07-11 NOTE — Telephone Encounter (Signed)
Transition Care Management Follow-up Telephone Call  Date of discharge and from where:07/09/20  How have you been since you were released from the hospital? Feeling better  Any questions or concerns? No  Items Reviewed:  Did the pt receive and understand the discharge instructions provided?Yes  Medications obtained and verified? yes  Any new allergies since your discharge? no  Dietary orders reviewed? yes  Do you have support at home? Yes  Other (ie: DME, Home Health, etc) No  Functional Questionnaire: (I = Independent and D = Dependent)  Bathing/Dressing- I  Meal Prep- I  Eating- I  Maintaining continence- I  Transferring/Ambulation- I  Managing Meds- I   Follow up appointments reviewed:    PCP Hospital f/u appt confirmed? Yes  Specialist Hospital f/u appt confirmed? Yes  Are transportation arrangements needed? No  If their condition worsens, is the pt aware to call  their PCP or go to the ED? Yes  Was the patient provided with contact information for the PCP's office or ED? Yes Was the pt encouraged to call back with questions or concerns? Yes

## 2020-07-13 ENCOUNTER — Encounter: Payer: Self-pay | Admitting: Nurse Practitioner

## 2020-07-13 ENCOUNTER — Other Ambulatory Visit: Payer: Self-pay

## 2020-07-13 ENCOUNTER — Ambulatory Visit (INDEPENDENT_AMBULATORY_CARE_PROVIDER_SITE_OTHER): Payer: Medicare Other | Admitting: Nurse Practitioner

## 2020-07-13 VITALS — BP 122/60 | HR 57 | Temp 98.0°F | Ht 64.0 in | Wt 211.0 lb

## 2020-07-13 DIAGNOSIS — N184 Chronic kidney disease, stage 4 (severe): Secondary | ICD-10-CM

## 2020-07-13 DIAGNOSIS — E1122 Type 2 diabetes mellitus with diabetic chronic kidney disease: Secondary | ICD-10-CM

## 2020-07-13 DIAGNOSIS — G4700 Insomnia, unspecified: Secondary | ICD-10-CM

## 2020-07-13 DIAGNOSIS — N179 Acute kidney failure, unspecified: Secondary | ICD-10-CM

## 2020-07-13 DIAGNOSIS — E7800 Pure hypercholesterolemia, unspecified: Secondary | ICD-10-CM

## 2020-07-13 DIAGNOSIS — I5033 Acute on chronic diastolic (congestive) heart failure: Secondary | ICD-10-CM

## 2020-07-13 DIAGNOSIS — Z6836 Body mass index (BMI) 36.0-36.9, adult: Secondary | ICD-10-CM

## 2020-07-13 DIAGNOSIS — E039 Hypothyroidism, unspecified: Secondary | ICD-10-CM

## 2020-07-13 DIAGNOSIS — I272 Pulmonary hypertension, unspecified: Secondary | ICD-10-CM

## 2020-07-13 NOTE — Patient Instructions (Signed)

## 2020-07-13 NOTE — Progress Notes (Signed)
I,[name],acting as a Education administrator for [employer], NP.,have documented all relevant documentation on the behalf of [employer], NP,as directed by  Bary Castilla, NP while in the presence of Bary Castilla, NP.  This visit occurred during the SARS-CoV-2 public health emergency.  Safety protocols were in place, including screening questions prior to the visit, additional usage of staff PPE, and extensive cleaning of exam room while observing appropriate contact time as indicated for disinfecting solutions.  Subjective:     Patient ID: Patricia Mata , female    DOB: 05-Nov-1955 , 64 y.o.   MRN: [id number]   Chief Complaint  Patient presents with  . Hospitalization Follow-up    HPI  The patient is here for a hospital follow up for which she was admitted for urine retention. She lost 23 pounds in the past 4 days since discharge. The patient has been taking lasix as prescribed at the hospital.  She feels so much better since losing all of the fluid. She is followed by the cardiologist, nephrologist (next apt 07/27/20).  No changes to her meds were made today. She is in compliance with her meds. She also states that she has been having trouble falling asleep lately. The patient will come in a week for her lab work for her nephrologist.    Diabetes: she is doing good with it and monitoring it at home. She is not eating much.  Exercise: she is not exercise at this time. She is going to start working on it coming this new year.   Wt Readings from Last 3 Encounters: 07/13/20 : 211 lb (95.7 kg) 07/09/20 : 221 lb 4.8 oz (100.4 kg) 06/21/20 : 223 lb (101.2 kg)    Diabetes She presents for her follow-up diabetic visit. Pertinent negatives for diabetes include no chest pain, no fatigue, no polydipsia, no polyphagia and no polyuria. Diabetic complications include heart disease. Risk factors for coronary artery disease include diabetes mellitus and hypertension.     Past  Medical History:  Diagnosis Date  . Acute renal failure (ARF) ([place]) 06/08/2018  . Cancer [facility]) 2008   Colon   . Chronic diastolic (congestive) heart failure ([place])   . Diabetes mellitus without complication ([place])   . Gout 09/08/2018  . Hypertension   . Macrocytic anemia   . MDS (myelodysplastic syndrome) ([place])   . Stage 4 chronic kidney disease (H[facility])      Family History  Problem Relation Age of Onset  . Hypertension Mother   . Diabetes Mother   . Cervical cancer Mother   . Heart attack Father   . Hypertension Sister   . Hypertension Brother   . Hypertension Sister   . Hypertension Sister   . Prostate cancer Brother   . HIV/AIDS Brother      Current Outpatient Medications:  .  allopurinol (ZYLOPRIM) 300 MG tablet, Take 300 mg by mouth daily., Disp: , Rfl:  .  amiodarone (PACERONE) 200 MG tablet, Take 1 tablet (200 mg total) by mouth daily., Disp: 90 tablet, Rfl: 1 .  atorvastatin (LIPITOR) 40 MG tablet, Take 1 tablet (40 mg total) by mouth daily., Disp: 90 tablet, Rfl: 1 .  blood glucose meter kit and supplies, Dispense based on patient and insurance preference. Use up to four times daily as directed. (FOR ICD-10 E10.9, E11.9). (Patient taking differently: 1 each by Other route See admin instructions. Dispense based on patient and insurance preference. Use up to four times daily as directed. (FOR ICD-10 E10.9, E11.9).), Disp:  1 each, Rfl: 3 .  Blood Glucose Monitoring Suppl (ONETOUCH ULTRALINK) w/Device KIT, Check blood sugars twice daily E11.9 (Patient taking differently: 1 each by Other route See admin instructions. Check blood sugars twice daily E11.9), Disp: 1 kit, Rfl: 0 .  carvedilol (COREG) 12.5 MG tablet, Take 1 tablet (12.5 mg total) by mouth 2 (two) times daily., Disp: 180 tablet, Rfl: 3 .  cloNIDine (CATAPRES) 0.1 MG tablet, Take 1 tablet (0.1 mg total) by mouth 2 (two) times daily., Disp: 60 tablet, Rfl: 11 .  fluticasone (FLONASE) 50 MCG/ACT nasal spray, Place 1 spray  into both nostrils daily., Disp: 16 g, Rfl: 2 .  furosemide (LASIX) 40 MG tablet, Take 1 tablet (40 mg total) by mouth daily. Take 80 mg in AM and 40 mg in PM (4PM), Disp: 90 tablet, Rfl: 0 .  glucose blood test strip, check blood sugars twice daily Dx code E11.22 (Patient taking differently: 1 each by Other route See admin instructions. check blood sugars twice daily Dx code E11.22), Disp: 100 each, Rfl: 3 .  hydrALAZINE (APRESOLINE) 100 MG tablet, Take 1 tablet (100 mg total) by mouth 3 (three) times daily., Disp: 90 tablet, Rfl: 3 .  Insulin Glargine (BASAGLAR KWIKPEN) 100 UNIT/ML, Inject 0.14 mLs (14 Units total) into the skin daily. (Patient taking differently: Inject 18 Units into the skin daily.), Disp: 5 pen, Rfl: 1 .  Insulin Pen Needle 29G X 5MM MISC, Use as directed (Patient taking differently: 1 each by Other route See admin instructions. Use as directed), Disp: 200 each, Rfl: 0 .  Lancets (ONETOUCH DELICA PLUS [id number]) MISC, check blood sugars twice daily Dx code: E11.22 (Patient taking differently: 1 each by Other route See admin instructions. check blood sugars twice daily Dx code: E11.22), Disp: 100 each, Rfl: 3 .  latanoprost (XALATAN) 0.005 % ophthalmic solution, Place 1 drop into both eyes at bedtime. , Disp: , Rfl:  .  levothyroxine (SYNTHROID) 125 MCG tablet, Take 1 tablet (125 mcg total) by mouth daily., Disp: 30 tablet, Rfl: 6 .  linagliptin (TRADJENTA) 5 MG TABS tablet, Take 1 tablet (5 mg total) by mouth daily., Disp: 90 tablet, Rfl: 1 .  loratadine (CLARITIN) 10 MG tablet, Take 10 mg by mouth daily., Disp: , Rfl:  .  Multiple Vitamins-Minerals (CENTRUM SILVER 50+WOMEN) TABS, Take 1 tablet by mouth daily., Disp: , Rfl:  .  sildenafil (REVATIO) 20 MG tablet, TAKE 1 TABLET BY MOUTH THREE TIMES DAILY (Patient taking differently: Take 20 mg by mouth 3 (three) times daily.), Disp: 90 tablet, Rfl: 0 .  sodium bicarbonate 650 MG tablet, Take 650 mg by mouth 2 (two) times daily.,  Disp: , Rfl:    Allergies  Allergen Reactions  . Ancef [Cefazolin] Itching    Severe itching- after procedure, ancef was the antibiotic.-04/02/17 Tolerates penicillins  . Lactose Intolerance (Gi) Diarrhea  . Sulfa Antibiotics Rash and Other (See Comments)    Blisters, also     Review of Systems  Constitutional: Positive for activity change. Negative for fatigue.  Respiratory: Negative for chest tightness, shortness of breath and wheezing.   Cardiovascular: Negative.  Negative for chest pain, palpitations and leg swelling.  Gastrointestinal: Negative.   Endocrine: Negative for polydipsia, polyphagia and polyuria.  Genitourinary: Negative for difficulty urinating.  Musculoskeletal: Negative for joint swelling.  Neurological: Negative.      Today's Vitals   07/13/20 1422  BP: 122/60  Pulse: (!) 57  Temp: 98 F (36.7 C)  TempSrc: Oral  Weight: 211 lb (95.7 kg)  Height: '5\' 4"'  (1.626 m)   Body mass index is 36.22 kg/m.  Wt Readings from Last 3 Encounters:  07/13/20 211 lb (95.7 kg)  07/09/20 221 lb 4.8 oz (100.4 kg)  06/21/20 223 lb (101.2 kg)    Objective:  Physical Exam Vitals reviewed.  Constitutional:      Appearance: Normal appearance. She is obese.  HENT:     Head: Normocephalic and atraumatic.  Cardiovascular:     Rate and Rhythm: Normal rate and regular rhythm.     Pulses: Normal pulses.     Heart sounds: Murmur heard.    Pulmonary:     Effort: Pulmonary effort is normal.     Breath sounds: Normal breath sounds. No wheezing, rhonchi or rales.  Neurological:     Mental Status: She is alert.  Psychiatric:        Mood and Affect: Mood normal.        Behavior: Behavior normal.        Thought Content: Thought content normal.        Judgment: Judgment normal.         Assessment And Plan:     1. Acute renal failure superimposed on stage 4 chronic kidney disease, unspecified acute renal failure type ([place]) -Chronic, stable -Continue with current meds   -Follow up with cardiologist and nephrologist  - CBC with Diff; Future - CMP14+EGFR; Future - Iron and TIBC([facility]); Future  2. Type 2 diabetes mellitus with stage 4 chronic kidney disease, without long-term current use of insulin (HCC) -Chronic, stable -Continue with current meds, she is tolerating well. -Encouraged to limit intake of sugary foods and drinks. -Encouraged to increase physical activity to 150 min per week.  - CBC with Diff; Future - CMP14+EGFR; Future - Hemoglobin A1c; Future  3. Class 2 severe obesity due to excess calories with serious comorbidity and body mass index (BMI) of 36.0 to 36.9 in adult [facility]) -Importance of achieving optimal weight to decrease risk of cardiovascular disease and cancers was discussed with the patient in full detail.    4. Acute on chronic diastolic CHF (congestive heart failure) (HCC) -Stable, continue with current meds -Followed by cardiologist   -CBC with Diff; Future  5. Pulmonary hypertension (HCC) -Stable, continue with current meds -Followed by cardiologist - CBC with Diff; Future - CMP14+EGFR; Future  6. Acquired hypothyroidism - Continue current meds  -Stable  - TSH; Future - T4; Future - T3, free; Future  7. Pure hypercholesterolemia -Chronic, stable  -Continue with current meds  - Lipid panel; Future  8. Insomnia, unspecified type -Patient was encouraged to take OTC melatonin -Patient was educated about using relaxation method and no screen time before bedtime.  She is encouraged to strive for BMI less than 30 to decrease cardiac risk. Advised to aim for at least 150 minutes of exercise per week.    Patient was given opportunity to ask questions. Patient verbalized understanding of the plan and was able to repeat key elements of the plan. All questions were answered to their satisfaction.  Bary Castilla, NP   I, Bary Castilla, NP, have reviewed all documentation for this visit. The  documentation on 07/13/20 for the exam, diagnosis, procedures, and orders are all accurate and complete.  THE PATIENT IS ENCOURAGED TO PRACTICE SOCIAL DISTANCING DUE TO THE COVID-19 PANDEMIC.

## 2020-07-17 ENCOUNTER — Other Ambulatory Visit: Payer: Medicare Other

## 2020-07-18 ENCOUNTER — Telehealth: Payer: Self-pay

## 2020-07-18 NOTE — Chronic Care Management (AMB) (Signed)
Chronic Care Management Pharmacy Assistant   Name: Patricia Mata  MRN: [id number] DOB: [birth year]  Reason for Encounter: Diabetes Adherence Call.  Patient Questions:  1.  Have you seen any other providers since your last visit? Yes, 04/27/20-Alvstad, Kristin L, RPH,CPP (OV). 05/04/20-Byrum, Rose Fillers, MD (OV). 05/11/20-Moore, [name], FNP (OV). 05/18/20-Shadad, Mathis Dad, MD (OV). 05/22/20-Aurora, Jonelle Sidle, MD (OV). 06/07/20-Girguis, Shanon Brow, MD; Karmen Bongo, MD (ED). 06/21/20-Rodriguez-Guzman, Raquel, RPH-CPP (OV). 07/06/20-07/09/20 Melanee Spry, MD; Sherwood Gambler, MD; Lenore Cordia, MD. 07/13/20-Ghurmman, Milford Cage, NP (OV).     2.  Any changes in your medicines or health? Yes, 04/27/20- Increased Doxazosin Mesylate 8 MG daily. 05/04/20-Added Pantoprazole Sodium 40 MG daily. 05/11/20-Added valACYclovir HCI 500 MG. 05/22/20-Added Carvedilol 12.5 mg; Colchicine dose change 0.6 MG tablet(Take 1/2 tablet 2 times a day as needed for gout. 06/07/20- Discontinued valACYclovir 500 MG. 12/23-12/26- Added Lasix 40 MG tablet.    PCP : [name], FNP  Allergies:   Allergies  Allergen Reactions   Ancef [Cefazolin] Itching    Severe itching- after procedure, ancef was the antibiotic.-04/02/17 Tolerates penicillins   Lactose Intolerance (Gi) Diarrhea   Sulfa Antibiotics Rash and Other (See Comments)    Blisters, also    Medications: Outpatient Encounter Medications as of 07/18/2020  Medication Sig   allopurinol (ZYLOPRIM) 300 MG tablet Take 300 mg by mouth daily.   amiodarone (PACERONE) 200 MG tablet Take 1 tablet (200 mg total) by mouth daily.   atorvastatin (LIPITOR) 40 MG tablet Take 1 tablet (40 mg total) by mouth daily.   blood glucose meter kit and supplies Dispense based on patient and insurance preference. Use up to four times daily as directed. (FOR ICD-10 E10.9, E11.9). (Patient taking differently: 1 each by Other route See admin instructions. Dispense based on  patient and insurance preference. Use up to four times daily as directed. (FOR ICD-10 E10.9, E11.9).)   Blood Glucose Monitoring Suppl (ONETOUCH ULTRALINK) w/Device KIT Check blood sugars twice daily E11.9 (Patient taking differently: 1 each by Other route See admin instructions. Check blood sugars twice daily E11.9)   carvedilol (COREG) 12.5 MG tablet Take 1 tablet (12.5 mg total) by mouth 2 (two) times daily.   cloNIDine (CATAPRES) 0.1 MG tablet Take 1 tablet (0.1 mg total) by mouth 2 (two) times daily.   fluticasone (FLONASE) 50 MCG/ACT nasal spray Place 1 spray into both nostrils daily.   furosemide (LASIX) 40 MG tablet Take 1 tablet (40 mg total) by mouth daily. Take 80 mg in AM and 40 mg in PM (4PM)   glucose blood test strip check blood sugars twice daily Dx code E11.22 (Patient taking differently: 1 each by Other route See admin instructions. check blood sugars twice daily Dx code E11.22)   hydrALAZINE (APRESOLINE) 100 MG tablet Take 1 tablet (100 mg total) by mouth 3 (three) times daily.   Insulin Glargine (BASAGLAR KWIKPEN) 100 UNIT/ML Inject 0.14 mLs (14 Units total) into the skin daily. (Patient taking differently: Inject 18 Units into the skin daily.)   Insulin Pen Needle 29G X 5MM MISC Use as directed (Patient taking differently: 1 each by Other route See admin instructions. Use as directed)   Lancets (ONETOUCH DELICA PLUS MEQAST41D) MISC check blood sugars twice daily Dx code: E11.22 (Patient taking differently: 1 each by Other route See admin instructions. check blood sugars twice daily Dx code: E11.22)   latanoprost (XALATAN) 0.005 % ophthalmic solution Place 1 drop into both eyes at bedtime.    levothyroxine (SYNTHROID) 125  MCG tablet Take 1 tablet (125 mcg total) by mouth daily.   linagliptin (TRADJENTA) 5 MG TABS tablet Take 1 tablet (5 mg total) by mouth daily.   loratadine (CLARITIN) 10 MG tablet Take 10 mg by mouth daily.   Multiple Vitamins-Minerals (CENTRUM  SILVER 50+WOMEN) TABS Take 1 tablet by mouth daily.   sildenafil (REVATIO) 20 MG tablet TAKE 1 TABLET BY MOUTH THREE TIMES DAILY (Patient taking differently: Take 20 mg by mouth 3 (three) times daily.)   sodium bicarbonate 650 MG tablet Take 650 mg by mouth 2 (two) times daily.   No facility-administered encounter medications on file as of 07/18/2020.    Current Diagnosis: Patient Active Problem List   Diagnosis Date Noted   Acute renal failure superimposed on stage 4 chronic kidney disease ([place])    Palliative care by specialist    Acute on chronic diastolic CHF (congestive heart failure) ([place]) 06/07/2020   Chronic cough 05/04/2020   Allergic rhinitis 03/23/2020   Chronic kidney disease, stage 5 ([place]) 02/03/2020   Purulent bronchitis ([place]) 79/08/4095   Acute diastolic CHF (congestive heart failure) ([place]) 09/21/2019   ARDS (adult respiratory distress syndrome) ([place])    Acute respiratory failure with hypoxia ([place]) 09/20/2019   HCAP (healthcare-associated pneumonia) 09/20/2019   Pulmonary hypertension ([place]) 09/20/2019   CKD (chronic kidney disease), stage III ([place]) 09/20/2019   DM2 (diabetes mellitus, type 2) ([place]) 09/20/2019   Elevated troponin 09/20/2019   Severe sepsis ([place]) 09/20/2019   CAP (community acquired pneumonia) 09/14/2019   Hypoxia 09/14/2019   Neutropenia ([place]) 05/25/2019   Malignant neoplasm of colon ([place]) 05/25/2019   Bradycardia 03/21/2019   Pneumonia due to COVID-19 virus 03/18/2019   Type 2 diabetes mellitus with hemoglobin A1c goal of less than 7.0% ([place]) 12/10/2018   Hypothyroidism 12/10/2018   Seasonal allergies 12/10/2018   Gout 09/08/2018   Bacterial infection due to Morganella morganii    Sacral wound 06/08/2018   Iron deficiency anemia due to chronic blood loss 06/08/2018   Hyponatremia 06/08/2018   Constipation 06/08/2018   Encounter for nasogastric (NG) tube placement    Pressure injury of skin 05/22/2018   PAF  (paroxysmal atrial fibrillation) ([place]) 05/11/2018   Normocytic anemia 06/14/2017   Aortic stenosis, severe 06/10/2017   Carotid artery disease ([place]) 06/10/2017   Essential hypertension 05/13/2017   Hyperlipidemia 05/13/2017   Bilateral lower extremity edema 05/13/2017   Port-A-Cath in place 04/28/2017   MDS (myelodysplastic syndrome) ([place]) 03/20/2017   Goals of care, counseling/discussion 03/20/2017   Anemia in chronic kidney disease 05/10/2016   Anemia in stage 1 chronic kidney disease 05/10/2016   Anemia due to chronic kidney disease 02/09/2016   Recent Relevant Labs: Lab Results  Component Value Date/Time   HGBA1C 6.0 (H) 05/11/2020 12:32 PM   HGBA1C 6.2 (H) 02/03/2020 12:31 PM   MICROALBUR 80 06/22/2019 12:02 PM    Kidney Function Lab Results  Component Value Date/Time   CREATININE 4.97 (H) 07/09/2020 03:57 AM   CREATININE 5.33 (H) 07/08/2020 10:10 AM   CREATININE 5.42 (HH) 06/29/2020 10:30 AM   CREATININE 2.78 (H) 05/18/2020 08:25 AM   CREATININE 2.24 (H) 01/27/2019 01:55 PM   CREATININE 2.48 (H) 09/08/2018 01:15 PM   CREATININE 1.4 (H) 05/26/2017 09:49 AM   CREATININE 1.7 (H) 05/09/2017 09:41 AM   GFRNONAA 9 (L) 07/09/2020 03:57 AM   GFRNONAA 8 (L) 06/29/2020 10:30 AM   GFRNONAA 22 (L) 01/27/2019 01:55 PM   GFRAA 12 (L) 06/21/2020 02:59 PM   GFRAA  20 (L) 04/07/2020 11:14 AM   GFRAA 26 (L) 01/27/2019 01:55 PM     Current antihyperglycemic regimen:  ? Basaglar 16 units daily ? Tradjenta 67m daily   What recent interventions/DTPs have been made to improve glycemic control:  o Recent interventions was to check blood sugar once daily, contact provider with any episodes of hypoglycemia, increase exercise with goal of at least 150 minutes weekly (more if tolerated), increase water intake to 64 ounces daily.   Have there been any recent hospitalizations or ED visits since last visit with CPP? Yes.     Patient denies hypoglycemic symptoms, including Pale,  Sweaty, Shaky, Hungry and Nervous/irritable  Patient denies hyperglycemic symptoms, including blurry vision, excessive thirst, fatigue, polyuria and weakness  How often are you checking your blood sugar? once daily  What are your blood sugars ranging?  o Fasting: 184 a.m. on 07/18/20. o Before meals: None o After meals: None o Bedtime: None  During the week, how often does your blood glucose drop below 70? Never  Are you checking your feet daily/regularly? Patient states she checks her feet daily with nothing to report.  Adherence Review: Is the patient currently on a STATIN medication? Yes Is the patient currently on ACE/ARB medication? No Does the patient have >5 day gap between last estimated fill dates? No    Goals Addressed            This Visit's Progress    Pharmacy Care Plan   On track    CARE PLAN ENTRY (see longitudinal plan of care for additional care plan information)  Current Barriers:   Chronic Disease Management support, education, and care coordination needs related to Hypertension, Hyperlipidemia, Diabetes, and Hypothyroidism   Hypertension BP Readings from Last 3 Encounters:  03/29/20 (!) 170/58  03/28/20 (!) 154/60  03/23/20 140/60    Pharmacist Clinical Goal(s): o Over the next 90 days, patient will work with PharmD and providers to achieve BP goal <130/80  Current regimen:  o Amlodipine 137mdaily o Clonidine 0.37m46mwice daily o Doxazosin 4mg30mily o Furosemide 40mg97mly o Hydralazine 100mg 53me times daily o Sildenafil 20mg t65m times daily  Interventions: o Provided dietary and exercise recommendations o Reviewed medications changes by [facility]; discussed with patient  Patient self care activities - Over the next 90 days, patient will: o Check BP daily, document, and provide at future appointments o Ensure daily salt intake < 2300 mg/day o Increase exercise with goal of at least 150 minutes weekly (more if  tolerated) o Use alternative seasoning to salt (Garlic, pepper, etc)  Hyperlipidemia Lab Results  Component Value Date/Time   LDLCALC 86 03/20/2019 04:06 AM    Pharmacist Clinical Goal(s): o Over the next 90 days, patient will work with PharmD and providers to achieve LDL goal < 70  Current regimen:  o Atorvastatin 40mg da38m Interventions: o Provided dietary and exercise recommendations o Discussed appropriate goals for LDL (less than 70) and HDL (greater than 50)  Patient self care activities - Over the next 90 days, patient will: o Increase exercise with goal of at least 150 minutes weekly (more if tolerated) o Increase intake of healthy fats in diet (avocados, walnuts, fish, flaxseed, etc)  Diabetes Lab Results  Component Value Date/Time   HGBA1C 6.2 (H) 02/03/2020 12:31 PM   HGBA1C 6.1 (H) 12/07/2019 12:35 PM    Pharmacist Clinical Goal(s): o Over the next 180 days, patient will work with PharmD and providers to maintain  A1c goal <7%  Current regimen:  o Basaglar 16 units daily o Tradjenta 64m daily  Interventions: o Provided dietary and exercise recommendations o Discussed appropriate goal for fasting blood sugar (80-130) o Hypoglycemia (blood sugar less than 70) and treatment o [place] Tradj[place] the mail - Continue to assist with patient assistance as needed o Determined [place]  Patient self care activities - Over the next 180 days, patient will: o Check blood sugar once daily, document, and provide at future appointments o Contact provider with any episodes of hypoglycemia o Increase exercise with goal of at least 150 minutes weekly (more if tolerated) o Increase water intake to 64 ounces daily o Utilize PLATE method for meal planning (provided education)  Hypothyroidism  Pharmacist Clinical  Goal(s) o Over the next 90 days, patient will work with PharmD and providers to achieve thyroid hormone levels within normal limits  Current regimen:  o Levothyroxine 1175m  Interventions: o Discussed recent TSH level (still elevated) and plan to recheck after 6 weeks of levothyroxine 11270mo Collaborated with PCP regarding scheduling patient for TSH recheck around 10/13 and then adjust levothyroxine dosing o Discussed possibility of switching to name brand Synthroid to decrease variability  Patient self care activities - Over the next 90 days, patient will: o Continue levothyroxine 112m74maily as directed o Obtain labwork when scheduled  Medication management  Pharmacist Clinical Goal(s): o Over the next 180 days, patient will work with PharmD and providers to maintain optimal medication adherence  Current pharmacy: [facility]  Interventions o Comprehensive medication review performed. o Continue current medication management strategy o Discussed vaccination and schedule  Patient self care activities - Over the next 180 days, patient will: o Focus on medication adherence by continued use of pill box o Take medications as prescribed o Report any questions or concerns to PharmD and/or provider(s)  Please see past updates related to this goal by clicking on the "Past Updates" button in the selected goal         Follow-Up:  Pharmacist Review-Patient reported she was hospitalized from 07/06/20-07/09/20. Per patient; excessive weight loss once she was discharged from hospital. Per discharge summary on 07/09/20 from [facility] ([facility]); discontinued Amlodipine 10 mg tablet, Doxazosin 8 mg tablet, and added Lasix 40 mg tablet. Per office visit on 07/13/20; hospitalization follow-up with no medication changes. Per patient; she is doing fine, checks blood sugars once daily, and continue to take all medications as prescribed.   VallOrlando PennerP notified.  CandRaynelle HighlandMA Delcambrermacist Assistant [phone]

## 2020-07-19 ENCOUNTER — Other Ambulatory Visit: Payer: Self-pay | Admitting: Family Medicine

## 2020-07-19 ENCOUNTER — Other Ambulatory Visit (HOSPITAL_COMMUNITY): Payer: Self-pay

## 2020-07-19 ENCOUNTER — Other Ambulatory Visit: Payer: Self-pay

## 2020-07-19 ENCOUNTER — Encounter: Payer: Self-pay | Admitting: Family Medicine

## 2020-07-19 ENCOUNTER — Other Ambulatory Visit (HOSPITAL_COMMUNITY)
Admission: RE | Admit: 2020-07-19 | Discharge: 2020-07-19 | Disposition: A | Discharge status: Home / Self Care | Payer: Medicare Other | Source: Ambulatory Visit | Attending: Family Medicine | Admitting: Family Medicine

## 2020-07-19 ENCOUNTER — Ambulatory Visit (INDEPENDENT_AMBULATORY_CARE_PROVIDER_SITE_OTHER): Payer: Medicare Other | Admitting: Family Medicine

## 2020-07-19 VITALS — BP 174/40 | HR 61 | Wt 201.6 lb

## 2020-07-19 DIAGNOSIS — N939 Abnormal uterine and vaginal bleeding, unspecified: Secondary | ICD-10-CM

## 2020-07-19 DIAGNOSIS — N765 Ulceration of vagina: Secondary | ICD-10-CM | POA: Insufficient documentation

## 2020-07-19 MED ORDER — ACETAMINOPHEN 500 MG PO TABS
1000.0000 mg | ORAL_TABLET | Freq: Once | ORAL | Status: AC
  Administered 2020-07-19: 1000 mg via ORAL

## 2020-07-19 MED ORDER — AMIODARONE HCL 200 MG PO TABS: 200.0000 mg | ORAL_TABLET | Freq: Every day | ORAL | 3 refills | Status: DC

## 2020-07-19 NOTE — Progress Notes (Signed)
GYNECOLOGY OFFICE VISIT NOTE  History:   Patricia Mata is a 65 y.o. No obstetric history on file. here today for evaluation of post-menopausal bleeding.  Saw PCP on 05/11/2020 for multiple issues At that visit reported post-menopausal bleeding and a family hx of uterine cancer in her mother Also had vaginal ulceration c/f HSV, culture was negative Also sent for TVUS, performed on 05/30/2020 showed endometrial strip 7-8 mm, multiple calcified fibroids around fundus measuring up to 3.8 cm, normal L ovary, R ovary not visualized  Today reports she has not had any vaginal bleeding since she saw her PCP in October Reports mother had stage I endometrial cancer, diagnosed in her 56's Bleeding was mainly very mild spotting Had menopause in her early 89's Former smoker, stopped in her 13's  Past Medical History:  Diagnosis Date  . Acute renal failure (ARF) ([place]) 06/08/2018  . Cancer [facility]) 2008   Colon   . Chronic diastolic (congestive) heart failure ([place])   . Diabetes mellitus without complication ([place])   . Gout 09/08/2018  . Hypertension   . Macrocytic anemia   . MDS (myelodysplastic syndrome) ([place])   . Stage 4 chronic kidney disease [facility])     Past Surgical History:  Procedure Laterality Date  . COLON SURGERY    . DEBRIDMENT OF DECUBITUS ULCER N/A 06/12/2018   Procedure: DEBRIDMENT OF DECUBITUS ULCER;  Surgeon: Wallace Going, DO;  Location: [facility];  Service: Plastics;  Laterality: N/A;  . IR FLUORO GUIDE PORT INSERTION RIGHT  04/02/2017  . IR REMOVAL TUN ACCESS W/ PORT W/O FL MOD SED  03/16/2019  . IR US GUIDE VASC ACCESS RIGHT  04/02/2017  . RIGHT HEART CATH N/A 04/23/2019   Procedure: RIGHT HEART CATH;  Surgeon: Jolaine Artist, MD;  Location: [place] [facility];  Service: Cardiovascular;  Laterality: N/A;  . RIGHT/LEFT HEART CATH AND CORONARY ANGIOGRAPHY N/A 05/21/2018   Procedure: RIGHT/LEFT HEART CATH AND CORONARY ANGIOGRAPHY;  Surgeon: Nelva Bush, MD;   Location: [place] [facility];  Service: Cardiovascular;  Laterality: N/A;    The following portions of the patient's history were reviewed and updated as appropriate: allergies, current medications, past family history, past medical history, past social history, past surgical history and problem list.   Health Maintenance:  Normal pap and negative HRHPV: 06/22/2019.  Normal mammogram: 05/31/2020.   Review of Systems:  Pertinent items noted in HPI and remainder of comprehensive ROS otherwise negative.  Physical Exam:  BP (!) 174/40   Pulse 61   Wt 201 lb 9.6 oz (91.4 kg)   BMI 34.60 kg/m  CONSTITUTIONAL: Well-developed, well-nourished female in no acute distress.  HEENT:  Normocephalic, atraumatic. External right and left ear normal. No scleral icterus.  NECK: Normal range of motion, supple, no masses noted on observation SKIN: No rash noted. Not diaphoretic. No erythema. No pallor. MUSCULOSKELETAL: Normal range of motion. No edema noted. NEUROLOGIC: Alert and oriented to person, place, and time. Normal muscle tone coordination.  PSYCHIATRIC: Normal mood and affect. Normal behavior. Normal judgment and thought content. RESPIRATORY: Effort normal, no problems with respiration noted PELVIC: Ulcerated lesion on L labia, HSV swab obtained. Normal vaginal mucosa and cervix without blood in the vault  Labs and Imaging No results found. However, due to the size of the patient record, not all encounters were searched. Please check Results Review for a complete set of results. DG Chest 2 View  Result Date: 07/06/2020 CLINICAL DATA:  65 year old female with shortness of  breath. EXAM: CHEST - 2 VIEW COMPARISON:  Chest radiograph dated 06/07/2020. FINDINGS: Mild cardiomegaly with mild vascular congestion. There is a small right pleural effusion with right lung base atelectasis. Pneumonia is not excluded clinical correlation is recommended. No pneumothorax. Atherosclerotic calcification of the aorta.  No acute osseous pathology. IMPRESSION: Cardiomegaly with mild vascular congestion and a small right pleural effusion. Pneumonia is not excluded. Electronically Signed   By: Anner Crete M.D.   On: 07/06/2020 17:23   US RENAL  Result Date: 07/06/2020 CLINICAL DATA:  Acute on chronic renal failure, hypertension, diabetes EXAM: RENAL / URINARY TRACT ULTRASOUND COMPLETE COMPARISON:  06/10/2020 FINDINGS: Right Kidney: Renal measurements: 11.0 x 5.3 by 5.8 cm = volume: 177.3 mL. There is mild increased renal cortical echotexture, stable. No hydronephrosis or nephrolithiasis. No renal mass. Left Kidney: Renal measurements: 11.8 x 6.1 x 5.9 cm = volume: 224.1 mL. There is mild increased renal cortical echotexture, stable. No hydronephrosis or nephrolithiasis. 1.1 cm cysts ventral cortex mid left kidney. Bladder: The bladder is decompressed which limits its evaluation. Other: None. IMPRESSION: 1. Stable mild increased renal cortical echotexture consistent with medical renal disease. Electronically Signed   By: Randa Ngo M.D.   On: 07/06/2020 21:12      Assessment and Plan:   Problem List Items Addressed This Visit      Genitourinary   Abnormal uterine bleeding (AUB) - Primary    Recommended EMB to evaluate for cancer, verbal and written consent obtained. Uncomplicated procedure performed.       Vaginal ulceration    Suspicious for HSV, prior culture negative but appears unroofed, culture collected.          Routine preventative health maintenance measures emphasized. Please refer to After Visit Summary for other counseling recommendations.   Return once biopsy results are available.    Total face-to-face time with patient: 20 minutes.  Over 50% of encounter was spent on counseling and coordination of care.   Clarnce Flock, MD/[facility] [employer], [place]

## 2020-07-19 NOTE — Assessment & Plan Note (Signed)
Suspicious for HSV, prior culture negative but appears unroofed, culture collected.

## 2020-07-19 NOTE — Assessment & Plan Note (Signed)
Recommended EMB to evaluate for cancer, verbal and written consent obtained. Uncomplicated procedure performed.

## 2020-07-19 NOTE — Patient Instructions (Signed)
Endometrial Biopsy, Care After This sheet gives you information about how to care for yourself after your procedure. Your health care provider may also give you more specific instructions. If you have problems or questions, contact your health care provider. What can I expect after the procedure? After the procedure, it is common to have:  Mild cramping.  A small amount of vaginal bleeding for a few days. This is normal. Follow these instructions at home:   Take over-the-counter and prescription medicines only as told by your health care provider.  Do not douche, use tampons, or have sexual intercourse until your health care provider approves.  Return to your normal activities as told by your health care provider. Ask your health care provider what activities are safe for you.  Follow instructions from your health care provider about any activity restrictions, such as restrictions on strenuous exercise or heavy lifting. Contact a health care provider if:  You have heavy bleeding, or bleed for longer than 2 days after the procedure.  You have bad smelling discharge from your vagina.  You have a fever or chills.  You have a burning sensation when urinating or you have difficulty urinating.  You have severe pain in your lower abdomen. Get help right away if:  You have severe cramps in your stomach or back.  You pass large blood clots.  Your bleeding increases.  You become weak or light-headed, or you pass out. Summary  After the procedure, it is common to have mild cramping and a small amount of vaginal bleeding for a few days.  Do not douche, use tampons, or have sexual intercourse until your health care provider approves.  Return to your normal activities as told by your health care provider. Ask your health care provider what activities are safe for you. This information is not intended to replace advice given to you by your health care provider. Make sure you discuss any  questions you have with your health care provider. Document Revised: 06/13/2017 Document Reviewed: 07/17/2016 Elsevier Patient Education  2020 Elsevier Inc.  

## 2020-07-19 NOTE — Progress Notes (Signed)
Patient given informed consent, signed copy in the chart, time out was performed. Appropriate time out taken. . The patient was placed in the lithotomy position and the cervix brought into view with sterile speculum.  Portio of cervix cleansed x 2 with betadine swabs.  A tenaculum was placed in the anterior lip of the cervix.  The uterus was sounded for depth of 8. A pipelle was introduced to into the uterus, suction created,  and an endometrial sample was obtained. All equipment was removed and accounted for.  The patient tolerated the procedure well.    Patient given post procedure instructions. Will call with results.

## 2020-07-20 ENCOUNTER — Inpatient Hospital Stay: Payer: Medicare Other

## 2020-07-20 ENCOUNTER — Other Ambulatory Visit: Payer: Self-pay

## 2020-07-20 ENCOUNTER — Inpatient Hospital Stay: Payer: Medicare Other | Attending: Oncology

## 2020-07-20 VITALS — BP 132/42 | HR 62 | Temp 97.7°F | Resp 18

## 2020-07-20 DIAGNOSIS — D469 Myelodysplastic syndrome, unspecified: Secondary | ICD-10-CM | POA: Insufficient documentation

## 2020-07-20 DIAGNOSIS — D649 Anemia, unspecified: Secondary | ICD-10-CM

## 2020-07-20 LAB — CBC WITH DIFFERENTIAL (CANCER CENTER ONLY)
Abs Immature Granulocytes: 0.02 10*3/uL (ref 0.00–0.07)
Basophils Absolute: 0 10*3/uL (ref 0.0–0.1)
Basophils Relative: 0 %
Eosinophils Absolute: 0.1 10*3/uL (ref 0.0–0.5)
Eosinophils Relative: 2 %
HCT: 28.3 % — ABNORMAL LOW (ref 36.0–46.0)
Hemoglobin: 9.2 g/dL — ABNORMAL LOW (ref 12.0–15.0)
Immature Granulocytes: 1 %
Lymphocytes Relative: 12 %
Lymphs Abs: 0.5 10*3/uL — ABNORMAL LOW (ref 0.7–4.0)
MCH: 30.6 pg (ref 26.0–34.0)
MCHC: 32.5 g/dL (ref 30.0–36.0)
MCV: 94 fL (ref 80.0–100.0)
Monocytes Absolute: 0.6 10*3/uL (ref 0.1–1.0)
Monocytes Relative: 15 %
Neutro Abs: 2.7 10*3/uL (ref 1.7–7.7)
Neutrophils Relative %: 70 %
Platelet Count: 187 10*3/uL (ref 150–400)
RBC: 3.01 MIL/uL — ABNORMAL LOW (ref 3.87–5.11)
RDW: 20.7 % — ABNORMAL HIGH (ref 11.5–15.5)
WBC Count: 3.8 10*3/uL — ABNORMAL LOW (ref 4.0–10.5)
nRBC: 0.5 % — ABNORMAL HIGH (ref 0.0–0.2)

## 2020-07-20 LAB — CMP (CANCER CENTER ONLY)
ALT: 16 U/L (ref 0–44)
AST: 33 U/L (ref 15–41)
Albumin: 3.7 g/dL (ref 3.5–5.0)
Alkaline Phosphatase: 79 U/L (ref 38–126)
Anion gap: 12 (ref 5–15)
BUN: 79 mg/dL — ABNORMAL HIGH (ref 8–23)
CO2: 25 mmol/L (ref 22–32)
Calcium: 9.7 mg/dL (ref 8.9–10.3)
Chloride: 107 mmol/L (ref 98–111)
Creatinine: 3.7 mg/dL (ref 0.44–1.00)
GFR, Estimated: 13 mL/min — ABNORMAL LOW (ref >60)
Glucose, Bld: 139 mg/dL — ABNORMAL HIGH (ref 70–99)
Potassium: 3.7 mmol/L (ref 3.5–5.1)
Sodium: 144 mmol/L (ref 135–145)
Total Bilirubin: 1 mg/dL (ref 0.3–1.2)
Total Protein: 7.4 g/dL (ref 6.5–8.1)

## 2020-07-20 LAB — SAMPLE TO BLOOD BANK

## 2020-07-20 MED ORDER — LUSPATERCEPT-AAMT 75 MG ~~LOC~~ SOLR
1.3300 mg/kg | Freq: Once | SUBCUTANEOUS | Status: AC
  Administered 2020-07-20: 115 mg via SUBCUTANEOUS
  Filled 2020-07-20: qty 1.5

## 2020-07-20 NOTE — Progress Notes (Signed)
Keep Reblozyl dose at 1.33 mg/kg today Hgb = 9.2 g/dL  Kennith Center, Pharm.D., CPP 07/20/2020@12 :04 PM

## 2020-07-20 NOTE — Patient Instructions (Signed)
Luspatercept injection What is this medicine? LUSPATERCEPT (lus PAT er sept) helps your body make more red blood cells. This medicine is used to treat anemia caused by beta thalassemia or myelodysplastic syndromes. This medicine may be used for other purposes; ask your health care provider or pharmacist if you have questions. COMMON BRAND NAME(S): REBLOZYL What should I tell my health care provider before I take this medicine? They need to know if you have any of these conditions:  cigarette smoker  have had your spleen removed  high blood pressure  history of blood clots  an unusual or allergic reaction to luspatercept, other medicines, foods, dyes or preservatives  pregnant or trying to get pregnant  breast-feeding How should I use this medicine? This medicine is for injection under the skin. It is given by a healthcare professional in a hospital or clinic setting. Talk to your pediatrician about the use of the medicine in children. This medicine is not approved for use in children. Overdosage: If you think you have taken too much of this medicine contact a poison control center or emergency room at once. NOTE: This medicine is only for you. Do not share this medicine with others. What if I miss a dose? Keep appointments for follow-up doses. It is important not to miss your dose. Call your doctor or healthcare professional if you are unable to keep an appointment. What may interact with this medicine? Interactions are not expected. This list may not describe all possible interactions. Give your health care provider a list of all the medicines, herbs, non-prescription drugs, or dietary supplements you use. Also tell them if you smoke, drink alcohol, or use illegal drugs. Some items may interact with your medicine. What should I watch for while using this medicine? Your condition will be monitored carefully while you are receiving this medicine. Do not become pregnant while taking  this medicine or for 3 months after stopping it. Women should inform their healthcare professional if they wish to become pregnant or think they might be pregnant. There is a potential for serious side effects and harm to an unborn child. Talk to your healthcare professional for more information. Do not breast-feed an infant while taking this medicine. You may need blood work done while you are taking this medicine. What side effects may I notice from receiving this medicine? Side effects that you should report to your doctor or health care professional as soon as possible:  allergic reactions like skin rash, itching or hives; swelling of the face, lips, or tongue  signs and symptoms of a blood clot such as chest pain; shortness of breath; pain, swelling, or warmth in the leg  signs and symptoms of a stroke like changes in vision; confusion; trouble speaking or understanding; severe headaches; sudden numbness or weakness of the face, arm or leg; trouble walking; dizziness; loss of balance or coordination Side effects that usually do not require medical attention (report these to your doctor or health care professional if they continue or are bothersome):  cough  diarrhea  dizziness  headache  joint pain  stomach pain  tiredness This list may not describe all possible side effects. Call your doctor for medical advice about side effects. You may report side effects to FDA at 1-800-FDA-1088. Where should I keep my medicine? This medicine is given in a hospital or clinic and will not be stored at home. NOTE: This sheet is a summary. It may not cover all possible information. If you have questions about   this medicine, talk to your doctor, pharmacist, or health care provider.  2020 Elsevier/Gold Standard (2018-10-20 15:57:59)  

## 2020-07-20 NOTE — Progress Notes (Signed)
Per Dr Alen Blew, no blood transfusion needed today; hemoglobin 9.2. Ok to proceed with luspatercept with creatinine 3.7.

## 2020-07-21 LAB — HERPES SIMPLEX VIRUS CULTURE

## 2020-07-21 LAB — SURGICAL PATHOLOGY

## 2020-07-25 ENCOUNTER — Telehealth: Payer: Self-pay | Admitting: Family Medicine

## 2020-07-25 NOTE — Telephone Encounter (Signed)
Called to inform patient of normal endometrial biopsy results, still w some bleeding but reassured normal post procedure. If has ongoing bleeding in 6 months to a year would repeat biopsy, otherwise continue routine care. All questions answered.

## 2020-07-26 ENCOUNTER — Other Ambulatory Visit: Payer: Self-pay | Admitting: Cardiovascular Disease

## 2020-07-26 ENCOUNTER — Telehealth: Payer: Self-pay

## 2020-07-31 NOTE — Progress Notes (Signed)
Advanced Heart Failure Clinic Note   Referring Physician: PCP: [name], FNP PCP-Cardiologist: Quay Burow, MD  Hematologist: Dr. Alen Blew AHF: Dr. Haroldine Laws   HPI:  Patricia Mata is a 65 y.o. 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 She was referred by Dr Gwenlyn Found for severe AS and pulmonary HTN.  Admitted 10/19  with AF with RVR and recurrent anemia. Developed respiratory failure and intubated. She was transfused and placed on IV amio. Subsequently developed AKI and placed on CVVHD. Converted to NSR on amio and weaned off the vent.  Echoshowed normal LVEF 65-70% with moderate AS and moderate MS and severe PH with RVSP 52mmHG. She is not a candidate for intervention of AS or MS with advanced PAH and transfusion-dependent myelodysplasia as a result of her colon CA   Underwent [facility] 11/19 that showed normal coronaries and severely elevated PA pressures.-> V/Q scan was negative. She was started on sildenafil and tolerated well. She diuresed with IV lasix and transitioned to lasix 40 mg PO BID. Palliative care was consulted and decision was made to transition to DNR.  She follows with Dr. Alen Blew and requires transfusions every 2-3 weeks as an outpatient. She also developed a buttock wound. WOC was consulted and recommended hydrotherapy for coccygeal osteo   Was admitted to [facility] for [place] in 9/20.   Had [place] 04/23/19 that showed mild PAH in setting of high-output (likely due to anemia). See data below  Admitted for CAP 3/21 and treated w/ abx. Returned to hospital 3 days later for worsening SOB and admitted for acute hypoxic respiratory failure requiring intubation. Chest CT showed multifocal PNA COVID negative..A/c CHF also felt to be contributing.   Admitted 11/21 for acute on chronic CHF. Echo 11/21 EF 60-65% G2DD severe AS mean 26mmHG AVA  0.84cm2. Moderate MS   Admitted 12/21 for recurrent volume overload and AKI. Cr 6.45. (baseline 2.8-3.5). Diuresed. Creatinine improved to 4.9 on d/c. Weight 221. Dr. Moshe Cipro suggested HD but patient refused.    Returns to clinic for routine f/u. Overall feeling fine. SOB with walking, but this is her baseline. Has not needed to take any extra lasix. Denies increasing SOB, CP, dizziness, palpitations, edema, or PND/orthopnea. Weights at home ~197-202 lbs. No fever or chills. Able to get around house and do minimal house work. [place] shopping using motorized carts. Gluteal wound fully healed. BP ~130s.   Cardiac studies: RHC 10/20 RA = 8 RV = 53/3 PA = 57/12 (32) PCW = 14 (v waves to 32) Fick cardiac output/index = 8.4/4.5 PVR = 2.1 WU Ao sat = 99% PA sat = 73%, 74% High SVC sat = 74%  RHC 11/19 RA  14 RV  110/15 PA 105/40 (62) PCWP 25 Ao sat 91% PA sat 53% Fick CO/CI  6.8/4.3  PVR 5.4  AoV  Mean 90mmHG AVA 1.3 cm2  PFTs 8/20 FEV1 2.24 (112%) FVC 2.63 (103%) DLCO 56%  Echo 02/15/19 EF 65% RV normal size/function. Septal flattening. Severe AS (mean 45mmHG) RVSP 53mmHG.   Echo 11/21 EF 60-65% G2DD severe AS mean 27mmHG AVA 0.84cm2. Moderate MS RVSP 9mmHG.  Review of systems complete and found to be negative unless listed in HPI.    Past Medical History:  Diagnosis Date  . Acute renal failure (ARF) ([place]) 06/08/2018  . Cancer [facility]) 2008   Colon   . Chronic diastolic (congestive) heart failure ([place])   .  Diabetes mellitus without complication ([place])   . Gout 09/08/2018  . Hypertension   . Macrocytic anemia   . MDS (myelodysplastic syndrome) ([place])   . Stage 4 chronic kidney disease ([place])     Current Outpatient Medications  Medication Sig Dispense Refill  . allopurinol (ZYLOPRIM) 300 MG tablet Take 300 mg by mouth daily.    Marland Kitchen amiodarone (PACERONE) 200 MG tablet Take 1 tablet (200 mg total) by mouth daily. 90 tablet 3  . atorvastatin (LIPITOR) 40 MG tablet  Take 1 tablet (40 mg total) by mouth daily. 90 tablet 1  . blood glucose meter kit and supplies Dispense based on patient and insurance preference. Use up to four times daily as directed. (FOR ICD-10 E10.9, E11.9). (Patient taking differently: 1 each by Other route See admin instructions. Dispense based on patient and insurance preference. Use up to four times daily as directed. (FOR ICD-10 E10.9, E11.9).) 1 each 3  . Blood Glucose Monitoring Suppl (ONETOUCH ULTRALINK) w/Device KIT Check blood sugars twice daily E11.9 (Patient taking differently: 1 each by Other route See admin instructions. Check blood sugars twice daily E11.9) 1 kit 0  . carvedilol (COREG) 12.5 MG tablet Take 1 tablet (12.5 mg total) by mouth 2 (two) times daily. 180 tablet 3  . cloNIDine (CATAPRES) 0.1 MG tablet Take 1 tablet (0.1 mg total) by mouth 2 (two) times daily. 60 tablet 11  . fluticasone (FLONASE) 50 MCG/ACT nasal spray Place 1 spray into both nostrils daily. 16 g 2  . furosemide (LASIX) 40 MG tablet Take 1 tablet (40 mg total) by mouth daily. Take 80 mg in AM and 40 mg in PM (4PM) 90 tablet 0  . glucose blood test strip check blood sugars twice daily Dx code E11.22 (Patient taking differently: 1 each by Other route See admin instructions. check blood sugars twice daily Dx code E11.22) 100 each 3  . hydrALAZINE (APRESOLINE) 100 MG tablet TAKE 1 TABLET BY MOUTH THREE TIMES DAILY 90 tablet 0  . Insulin Glargine (BASAGLAR KWIKPEN) 100 UNIT/ML Inject 0.14 mLs (14 Units total) into the skin daily. (Patient taking differently: Inject 18 Units into the skin daily.) 5 pen 1  . Insulin Pen Needle 29G X 5MM MISC Use as directed (Patient taking differently: 1 each by Other route See admin instructions. Use as directed) 200 each 0  . Lancets (ONETOUCH DELICA PLUS [id number]) MISC check blood sugars twice daily Dx code: E11.22 (Patient taking differently: 1 each by Other route See admin instructions. check blood sugars twice daily Dx code:  E11.22) 100 each 3  . latanoprost (XALATAN) 0.005 % ophthalmic solution Place 1 drop into both eyes at bedtime.     Marland Kitchen levothyroxine (SYNTHROID) 125 MCG tablet Take 1 tablet (125 mcg total) by mouth daily. 30 tablet 6  . linagliptin (TRADJENTA) 5 MG TABS tablet Take 1 tablet (5 mg total) by mouth daily. 90 tablet 1  . loratadine (CLARITIN) 10 MG tablet Take 10 mg by mouth daily.    . Multiple Vitamins-Minerals (CENTRUM SILVER 50+WOMEN) TABS Take 1 tablet by mouth daily.    . sildenafil (REVATIO) 20 MG tablet TAKE 1 TABLET BY MOUTH THREE TIMES DAILY (Patient taking differently: Take 20 mg by mouth 3 (three) times daily.) 90 tablet 0  . sodium bicarbonate 650 MG tablet Take 650 mg by mouth 2 (two) times daily.     No current facility-administered medications for this encounter.    Allergies  Allergen Reactions  . Ancef [Cefazolin] Itching  Severe itching- after procedure, ancef was the antibiotic.-04/02/17 Tolerates penicillins  . Lactose Intolerance (Gi) Diarrhea  . Sulfa Antibiotics Rash and Other (See Comments)    Blisters, also     Social History   Socioeconomic History  . Marital status: Married    Spouse name: Not on file  . Number of children: Not on file  . Years of education: Not on file  . Highest education level: Not on file  Occupational History  . Occupation: retired  Tobacco Use  . Smoking status: Former Smoker    Packs/day: 0.25    Years: 20.00    Pack years: 5.00    Types: Cigarettes    Quit date: 01/31/2001    Years since quitting: 19.5  . Smokeless tobacco: Former Network engineer  . Vaping Use: Never used  Substance and Sexual Activity  . Alcohol use: Yes    Alcohol/week: 2.0 standard drinks    Types: 2 Shots of liquor per week  . Drug use: Never  . Sexual activity: Not on file  Other Topics Concern  . Not on file  Social History Narrative  . Not on file   Social Determinants of Health   Financial Resource Strain: Medium Risk  . Difficulty of  Paying Living Expenses: Somewhat hard  Food Insecurity: Not on file  Transportation Needs: Not on file  Physical Activity: Not on file  Stress: Not on file  Social Connections: Not on file  Intimate Partner Violence: Not on file     Family History  Problem Relation Age of Onset  . Hypertension Mother   . Diabetes Mother   . Cervical cancer Mother   . Heart attack Father   . Hypertension Sister   . Hypertension Brother   . Hypertension Sister   . Hypertension Sister   . Prostate cancer Brother   . HIV/AIDS Brother    Vitals:   08/01/20 1034  BP: (!) 160/70  Pulse: (!) 59  SpO2: 100%  Weight: 93.7 kg (206 lb 9.6 oz)    Wt Readings from Last 3 Encounters:  08/01/20 93.7 kg (206 lb 9.6 oz)  07/19/20 91.4 kg (201 lb 9.6 oz)  07/13/20 95.7 kg (211 lb)    PHYSICAL EXAM: General:  Well appearing. No resp difficulty. HEENT: normal Neck: Supple. no JVD. Carotids 2+ bilat; no bruits. No lymphadenopathy or thryomegaly appreciated. Cor: PMI nondisplaced. Regular rate & rhythm. No rubs, III/VI SEM RUSB, inadudible S2 Lungs: clear Abdomen: soft, nontender, nondistended. No hepatosplenomegaly. No bruits or masses. Good bowel sounds. Extremities: no cyanosis, clubbing, rash, edema Neuro: alert & oriented x 3, cranial nerves grossly intact. moves all 4 extremities w/o difficulty. Affect pleasant   ASSESSMENT & PLAN:  1. PAH - Echo 05/12/18 LVEF 65-70%, Grade 2 DD, Mod AS, Mild/Mod MS, Mod TR, Trivial PI, PA peak pressure 93.  - L/RHC 05/21/18 with no significant CAD, severe pulmonary HTN with PA 105/40 (62) PCW 33 PVR 5.4 WU - Suspect multifactorial in nature suspect including high output due to anemia, WHO group II (diastolic HF and valvular disease) and III (OSA) but given degree of PH cannot exclude WHO Group I component. - Repeat RHC 04/23/19 with much improved pulmonary pressures. PA 57/12 (32) PCWP 14 (v to 32) - Echo 11/21 EF 60-65% G2DD severe AS mean 15mmHG AVA 0.84cm2.  Moderate MS RVSP 19mmHG - VQ negative. - PFTs 8/20 with normal spirometry and moderately reduced DLCO. - Sleep study previously ordered but not completed. Will reorder. -  Continue sildenafil 20 mg TID cautiously (has AS).   3. Chronic diastolic HF - Echo 2/46 EF 65% with normal RV size and function. + septal flattening. RVSP 64 mmHG. - Echo 11/21 EF 60-65% G2DD severe AS mean 22mHG AVA 0.84cm2. Moderate MS RVSP 54 mmHG. - Multiple hospitalizations for ADHF/AKI. - Overall stable NYHA III. Volume status good. - Continue lasix 80 mg in AM/40 mg in PM; if weight goes up 3-4 lbs, take 2 tablets in AM, 2 tablets in PM x 2 days, if weight doesn't come back down she is instructed to call clinic. - Continue daily weights and salt/fluid restriction.  4. Severe AS/Moderate MS - Echos 8/20 and again 3/21 with severe AS but on exam sounds more moderate c/w previous cath.  - Echo 11/21 EF 60-65% G2DD severe AS mean 385mG AVA 0.84cm2. Moderate MS RVSP 5424m - Denies angina, dyspnea, syncope/near syncope - She has seen Dr. ShaAlen Blewd he has made it clear that she has a terminal illness but he would have no issue with using Plavix if needed - With CKD 4, PAH and transfusion dependent anemia options are very limited but given how functional she has been will refer to Dr. CooBurt Knack get his thoughts on potential TAVR work-up as she gets closet to needing HD. With severity of AS, I worry that she might not tolerate HD without TAVR.   3. PAF - Maintaining NSR on amiodarone. - Continue amiodarone 200 daily. Reviewed labs. TSH and HFT recently checked 3/21 and normal - Continue carvedilol 12.5 mg BID. - Continue to avoid anticoagulation with transfusion dependent anemia. (chronic).  - CHA2DS2/VASc is at least 4.  - Check TSH today, CMET (1/22) ok.  5. CKD 4 - Baseline SCr 2.8-3.5; AKI to 6.5 (12/21) - Creatinine 3.70 (1/22) - Recheck labs today - Willing to accept HD as needed. I am concerned that may  not tolerate well with AS. See TAVR discussion above.   6. Transfusion dependent anemia in setting of Myelodysplastic syndrome -Transfuse as needed per [place] 7. HTN - BP elevated, but better. SBP at home ~ 130s. - continue hydralazine 100 tid. - continue clonidine 0.1 mg bid. - Continue amlodipine 5 mg daily. - Followed at [facility] Clinic. - Continue to check BP at home and log.  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  - Resolved.  Total time spent 45 minutes. Over half that time spent discussing above.   DanGlori BickersD 08/01/20

## 2020-08-01 ENCOUNTER — Ambulatory Visit (HOSPITAL_COMMUNITY)
Admission: RE | Admit: 2020-08-01 | Discharge: 2020-08-01 | Disposition: A | Discharge status: Home / Self Care | Payer: Medicare Other | Source: Ambulatory Visit | Attending: Internal Medicine | Admitting: Internal Medicine

## 2020-08-01 ENCOUNTER — Encounter (HOSPITAL_COMMUNITY): Payer: Self-pay | Admitting: Internal Medicine

## 2020-08-01 ENCOUNTER — Other Ambulatory Visit: Payer: Self-pay

## 2020-08-01 ENCOUNTER — Ambulatory Visit: Payer: BC Managed Care – PPO | Admitting: Emergency Medicine

## 2020-08-01 VITALS — BP 160/70 | HR 59 | Wt 206.6 lb

## 2020-08-01 DIAGNOSIS — I350 Nonrheumatic aortic (valve) stenosis: Secondary | ICD-10-CM

## 2020-08-01 DIAGNOSIS — G4733 Obstructive sleep apnea (adult) (pediatric): Secondary | ICD-10-CM | POA: Diagnosis not present

## 2020-08-01 DIAGNOSIS — Z87891 Personal history of nicotine dependence: Secondary | ICD-10-CM | POA: Diagnosis not present

## 2020-08-01 DIAGNOSIS — I2721 Secondary pulmonary arterial hypertension: Secondary | ICD-10-CM | POA: Diagnosis not present

## 2020-08-01 DIAGNOSIS — Z794 Long term (current) use of insulin: Secondary | ICD-10-CM | POA: Diagnosis not present

## 2020-08-01 DIAGNOSIS — Z8042 Family history of malignant neoplasm of prostate: Secondary | ICD-10-CM | POA: Insufficient documentation

## 2020-08-01 DIAGNOSIS — I5032 Chronic diastolic (congestive) heart failure: Secondary | ICD-10-CM | POA: Insufficient documentation

## 2020-08-01 DIAGNOSIS — Z79899 Other long term (current) drug therapy: Secondary | ICD-10-CM | POA: Insufficient documentation

## 2020-08-01 DIAGNOSIS — N184 Chronic kidney disease, stage 4 (severe): Secondary | ICD-10-CM | POA: Insufficient documentation

## 2020-08-01 DIAGNOSIS — I272 Pulmonary hypertension, unspecified: Secondary | ICD-10-CM | POA: Insufficient documentation

## 2020-08-01 DIAGNOSIS — I480 Paroxysmal atrial fibrillation: Secondary | ICD-10-CM | POA: Insufficient documentation

## 2020-08-01 DIAGNOSIS — D469 Myelodysplastic syndrome, unspecified: Secondary | ICD-10-CM | POA: Diagnosis not present

## 2020-08-01 DIAGNOSIS — Z833 Family history of diabetes mellitus: Secondary | ICD-10-CM | POA: Diagnosis not present

## 2020-08-01 DIAGNOSIS — Z882 Allergy status to sulfonamides status: Secondary | ICD-10-CM | POA: Diagnosis not present

## 2020-08-01 DIAGNOSIS — Z8249 Family history of ischemic heart disease and other diseases of the circulatory system: Secondary | ICD-10-CM | POA: Insufficient documentation

## 2020-08-01 DIAGNOSIS — Z881 Allergy status to other antibiotic agents status: Secondary | ICD-10-CM | POA: Diagnosis not present

## 2020-08-01 DIAGNOSIS — I10 Essential (primary) hypertension: Secondary | ICD-10-CM

## 2020-08-01 DIAGNOSIS — I130 Hypertensive heart and chronic kidney disease with heart failure and stage 1 through stage 4 chronic kidney disease, or unspecified chronic kidney disease: Secondary | ICD-10-CM | POA: Diagnosis present

## 2020-08-01 DIAGNOSIS — E1122 Type 2 diabetes mellitus with diabetic chronic kidney disease: Secondary | ICD-10-CM | POA: Diagnosis not present

## 2020-08-01 DIAGNOSIS — I080 Rheumatic disorders of both mitral and aortic valves: Secondary | ICD-10-CM | POA: Diagnosis not present

## 2020-08-01 DIAGNOSIS — Z8049 Family history of malignant neoplasm of other genital organs: Secondary | ICD-10-CM | POA: Insufficient documentation

## 2020-08-01 DIAGNOSIS — E739 Lactose intolerance, unspecified: Secondary | ICD-10-CM | POA: Insufficient documentation

## 2020-08-01 DIAGNOSIS — Z880 Allergy status to penicillin: Secondary | ICD-10-CM | POA: Insufficient documentation

## 2020-08-01 LAB — TSH: TSH: 8.682 u[IU]/mL — ABNORMAL HIGH (ref 0.350–4.500)

## 2020-08-01 NOTE — Addendum Note (Signed)
Encounter addended by: Jolaine Artist, MD on: 08/01/2020 12:18 PM  Actions taken: Level of Service modified, Visit diagnoses modified

## 2020-08-01 NOTE — Patient Instructions (Signed)
Labs done today, your results will be available in MyChart, we will contact you for abnormal readings.  You have been referred to Dr Burt Knack at [facility], his office will call you for an appointment

## 2020-08-04 ENCOUNTER — Ambulatory Visit: Payer: BC Managed Care – PPO | Admitting: Emergency Medicine

## 2020-08-04 ENCOUNTER — Telehealth: Payer: Self-pay

## 2020-08-04 NOTE — Chronic Care Management (AMB) (Addendum)
Chronic Care Management Pharmacy Assistant   Name: Kassi Esteve  MRN: [id number] DOB: June 29, 1956  Reason for Encounter: Patient Assistance Coordination  08/04/20-Called and spoke with the patient regarding [employer] patient assistance application for Tr[employer]. Patient was made aware her signature and income amount is needing on forms. Patient stated she will come into the office and have this completed on the same day of her scheduled appointment 08/10/20 at 2:45 pm at [facility].  PCP : [name], FNP  Allergies:   Allergies  Allergen Reactions   Ancef [Cefazolin] Itching    Severe itching- after procedure, ancef was the antibiotic.-04/02/17 Tolerates penicillins   Lactose Intolerance (Gi) Diarrhea   Sulfa Antibiotics Rash and Other (See Comments)    Blisters, also    Medications: Outpatient Encounter Medications as of 08/04/2020  Medication Sig   allopurinol (ZYLOPRIM) 300 MG tablet Take 300 mg by mouth daily.   amiodarone (PACERONE) 200 MG tablet Take 1 tablet (200 mg total) by mouth daily.   atorvastatin (LIPITOR) 40 MG tablet Take 1 tablet (40 mg total) by mouth daily.   blood glucose meter kit and supplies Dispense based on patient and insurance preference. Use up to four times daily as directed. (FOR ICD-10 E10.9, E11.9). (Patient taking differently: 1 each by Other route See admin instructions. Dispense based on patient and insurance preference. Use up to four times daily as directed. (FOR ICD-10 E10.9, E11.9).)   Blood Glucose Monitoring Suppl (ONETOUCH ULTRALINK) w/Device KIT Check blood sugars twice daily E11.9 (Patient taking differently: 1 each by Other route See admin instructions. Check blood sugars twice daily E11.9)   carvedilol (COREG) 12.5 MG tablet Take 1 tablet (12.5 mg total) by mouth 2 (two) times daily.   cloNIDine (CATAPRES) 0.1 MG tablet Take 1 tablet (0.1 mg total) by mouth 2 (two) times daily.   fluticasone (FLONASE) 50 MCG/ACT nasal  spray Place 1 spray into both nostrils daily.   furosemide (LASIX) 40 MG tablet Take 1 tablet (40 mg total) by mouth daily. Take 80 mg in AM and 40 mg in PM (4PM)   glucose blood test strip check blood sugars twice daily Dx code E11.22 (Patient taking differently: 1 each by Other route See admin instructions. check blood sugars twice daily Dx code E11.22)   hydrALAZINE (APRESOLINE) 100 MG tablet TAKE 1 TABLET BY MOUTH THREE TIMES DAILY   Insulin Glargine (BASAGLAR KWIKPEN) 100 UNIT/ML Inject 0.14 mLs (14 Units total) into the skin daily. (Patient taking differently: Inject 18 Units into the skin daily.)   Insulin Pen Needle 29G X 5MM MISC Use as directed (Patient taking differently: 1 each by Other route See admin instructions. Use as directed)   Lancets (ONETOUCH DELICA PLUS [id number]) MISC check blood sugars twice daily Dx code: E11.22 (Patient taking differently: 1 each by Other route See admin instructions. check blood sugars twice daily Dx code: E11.22)   latanoprost (XALATAN) 0.005 % ophthalmic solution Place 1 drop into both eyes at bedtime.    levothyroxine (SYNTHROID) 125 MCG tablet Take 1 tablet (125 mcg total) by mouth daily.   linagliptin (TRADJENTA) 5 MG TABS tablet Take 1 tablet (5 mg total) by mouth daily.   loratadine (CLARITIN) 10 MG tablet Take 10 mg by mouth daily.   Multiple Vitamins-Minerals (CENTRUM SILVER 50+WOMEN) TABS Take 1 tablet by mouth daily.   sildenafil (REVATIO) 20 MG tablet TAKE 1 TABLET BY MOUTH THREE TIMES DAILY (Patient taking differently: Take 20 mg by mouth 3 (  three) times daily.)   sodium bicarbonate 650 MG tablet Take 650 mg by mouth 2 (two) times daily.   No facility-administered encounter medications on file as of 08/04/2020.    Current Diagnosis: Patient Active Problem List   Diagnosis Date Noted   Abnormal uterine bleeding (AUB) 07/19/2020   Vaginal ulceration 07/19/2020   Acute renal failure superimposed on stage 4 chronic kidney disease ([place])     Palliative care by specialist    Acute on chronic diastolic CHF (congestive heart failure) ([place]) 06/07/2020   Chronic cough 05/04/2020   Allergic rhinitis 03/23/2020   Chronic kidney disease, stage 5 ([place]) 02/03/2020   Purulent bronchitis ([place]) 25/75/0518   Acute diastolic CHF (congestive heart failure) ([place]) 09/21/2019   ARDS (adult respiratory distress syndrome) ([place])    Acute respiratory failure with hypoxia ([place]) 09/20/2019   HCAP (healthcare-associated pneumonia) 09/20/2019   Pulmonary hypertension ([place]) 09/20/2019   CKD (chronic kidney disease), stage III ([place]) 09/20/2019   DM2 (diabetes mellitus, type 2) ([place]) 09/20/2019   Elevated troponin 09/20/2019   Severe sepsis ([place]) 09/20/2019   CAP (community acquired pneumonia) 09/14/2019   Hypoxia 09/14/2019   Neutropenia ([place]) 05/25/2019   Malignant neoplasm of colon ([place]) 05/25/2019   Bradycardia 03/21/2019   Pneumonia due to COVID-19 virus 03/18/2019   Type 2 diabetes mellitus with hemoglobin A1c goal of less than 7.0% ([place]) 12/10/2018   Hypothyroidism 12/10/2018   Seasonal allergies 12/10/2018   Gout 09/08/2018   Bacterial infection due to Morganella morganii    Sacral wound 06/08/2018   Iron deficiency anemia due to chronic blood loss 06/08/2018   Hyponatremia 06/08/2018   Constipation 06/08/2018   Encounter for nasogastric (NG) tube placement    Pressure injury of skin 05/22/2018   PAF (paroxysmal atrial fibrillation) ([place]) 05/11/2018   Normocytic anemia 06/14/2017   Aortic stenosis, severe 06/10/2017   Carotid artery disease ([place]) 06/10/2017   Essential hypertension 05/13/2017   Hyperlipidemia 05/13/2017   Bilateral lower extremity edema 05/13/2017   Port-A-Cath in place 04/28/2017   MDS (myelodysplastic syndrome) ([place]) 03/20/2017   Goals of care, counseling/discussion 03/20/2017   Anemia in chronic kidney disease 05/10/2016   Anemia in stage 1 chronic kidney disease 05/10/2016   Anemia due to chronic kidney disease  02/09/2016     Follow-Up:  Patient Assistance Coordination-Notified Orlando Penner, CPP.  Raynelle Highland, [place] Pharmacist Assistant [phone]  I have reviewed the care management and care coordination activities outlined in this encounter and I am certifying that I agree with the content of this note. No further action required.  1 minutes spent in review, coordination, and documentation.  Mayford Knife, [facility] 08/08/20 9:36 AM

## 2020-08-08 ENCOUNTER — Other Ambulatory Visit: Payer: Self-pay | Admitting: Nurse Practitioner

## 2020-08-08 ENCOUNTER — Other Ambulatory Visit (HOSPITAL_COMMUNITY): Payer: Self-pay | Admitting: Internal Medicine

## 2020-08-08 ENCOUNTER — Encounter: Payer: Self-pay | Admitting: Nurse Practitioner

## 2020-08-08 DIAGNOSIS — E7800 Pure hypercholesterolemia, unspecified: Secondary | ICD-10-CM

## 2020-08-08 LAB — HM DIABETES EYE EXAM

## 2020-08-10 ENCOUNTER — Encounter: Payer: Self-pay | Admitting: Nurse Practitioner

## 2020-08-10 ENCOUNTER — Other Ambulatory Visit: Payer: Self-pay

## 2020-08-10 ENCOUNTER — Ambulatory Visit (INDEPENDENT_AMBULATORY_CARE_PROVIDER_SITE_OTHER): Payer: Medicare Other | Admitting: Nurse Practitioner

## 2020-08-10 VITALS — BP 118/58 | HR 60 | Temp 97.8°F | Ht 64.0 in | Wt 204.0 lb

## 2020-08-10 DIAGNOSIS — E1122 Type 2 diabetes mellitus with diabetic chronic kidney disease: Secondary | ICD-10-CM | POA: Diagnosis not present

## 2020-08-10 DIAGNOSIS — N184 Chronic kidney disease, stage 4 (severe): Secondary | ICD-10-CM

## 2020-08-10 DIAGNOSIS — E7800 Pure hypercholesterolemia, unspecified: Secondary | ICD-10-CM

## 2020-08-10 DIAGNOSIS — N183 Chronic kidney disease, stage 3 unspecified: Secondary | ICD-10-CM

## 2020-08-10 DIAGNOSIS — Z6835 Body mass index (BMI) 35.0-35.9, adult: Secondary | ICD-10-CM

## 2020-08-10 DIAGNOSIS — E039 Hypothyroidism, unspecified: Secondary | ICD-10-CM

## 2020-08-10 MED ORDER — LEVOTHYROXINE SODIUM 137 MCG PO TABS: 137.0000 ug | ORAL_TABLET | Freq: Every day | ORAL | 1 refills | Status: DC

## 2020-08-10 NOTE — Patient Instructions (Signed)
Hypothyroidism  Hypothyroidism is when the thyroid gland does not make enough of certain hormones (it is underactive). The thyroid gland is a small gland located in the lower front part of the neck, just in front of the windpipe (trachea). This gland makes hormones that help control how the body uses food for energy (metabolism) as well as how the heart and brain function. These hormones also play a role in keeping your bones strong. When the thyroid is underactive, it produces too little of the hormones thyroxine (T4) and triiodothyronine (T3). What are the causes? This condition may be caused by:  Hashimoto's disease. This is a disease in which the body's disease-fighting system (immune system) attacks the thyroid gland. This is the most common cause.  Viral infections.  Pregnancy.  Certain medicines.  Birth defects.  Past radiation treatments to the head or neck for cancer.  Past treatment with radioactive iodine.  Past exposure to radiation in the environment.  Past surgical removal of part or all of the thyroid.  Problems with a gland in the center of the brain (pituitary gland).  Lack of enough iodine in the diet. What increases the risk? You are more likely to develop this condition if:  You are female.  You have a family history of thyroid conditions.  You use a medicine called lithium.  You take medicines that affect the immune system (immunosuppressants). What are the signs or symptoms? Symptoms of this condition include:  Feeling as though you have no energy (lethargy).  Not being able to tolerate cold.  Weight gain that is not explained by a change in diet or exercise habits.  Lack of appetite.  Dry skin.  Coarse hair.  Menstrual irregularity.  Slowing of thought processes.  Constipation.  Sadness or depression. How is this diagnosed? This condition may be diagnosed based on:  Your symptoms, your medical history, and a physical exam.  Blood  tests. You may also have imaging tests, such as an ultrasound or MRI. How is this treated? This condition is treated with medicine that replaces the thyroid hormones that your body does not make. After you begin treatment, it may take several weeks for symptoms to go away. Follow these instructions at home:  Take over-the-counter and prescription medicines only as told by your health care provider.  If you start taking any new medicines, tell your health care provider.  Keep all follow-up visits as told by your health care provider. This is important. ? As your condition improves, your dosage of thyroid hormone medicine may change. ? You will need to have blood tests regularly so that your health care provider can monitor your condition. Contact a health care provider if:  Your symptoms do not get better with treatment.  You are taking thyroid hormone replacement medicine and you: ? Sweat a lot. ? Have tremors. ? Feel anxious. ? Lose weight rapidly. ? Cannot tolerate heat. ? Have emotional swings. ? Have diarrhea. ? Feel weak. Get help right away if you have:  Chest pain.  An irregular heartbeat.  A rapid heartbeat.  Difficulty breathing. Summary  Hypothyroidism is when the thyroid gland does not make enough of certain hormones (it is underactive).  When the thyroid is underactive, it produces too little of the hormones thyroxine (T4) and triiodothyronine (T3).  The most common cause is Hashimoto's disease, a disease in which the body's disease-fighting system (immune system) attacks the thyroid gland. The condition can also be caused by viral infections, medicine, pregnancy, or   past radiation treatment to the head or neck.  Symptoms may include weight gain, dry skin, constipation, feeling as though you do not have energy, and not being able to tolerate cold.  This condition is treated with medicine to replace the thyroid hormones that your body does not make. This  information is not intended to replace advice given to you by your health care provider. Make sure you discuss any questions you have with your health care provider. Document Revised: 03/31/2020 Document Reviewed: 03/16/2020 Elsevier Patient Education  2021 [employer]  

## 2020-08-10 NOTE — Progress Notes (Signed)
This visit occurred during the SARS-CoV-2 public health emergency.  Safety protocols were in place, including screening questions prior to the visit, additional usage of staff PPE, and extensive cleaning of exam room while observing appropriate contact time as indicated for disinfecting solutions.  Subjective:     Patient ID: Patricia Mata , female    DOB: 05/03/1956 , 65 y.o.   MRN: [id number]   Chief Complaint  Patient presents with  . Thyroid Problem    HPI  Patient is here today for a follow up on her thyroid labs. Will also check her Hgb A1c today and her lipid panel. Patient saw her kidney doctor today, she is also followed by a cardiologist who she saw recently where they did her TSH. She also sees a Materials engineer tom. No other concerns today.     Past Medical History:  Diagnosis Date  . Acute renal failure (ARF) ([place]) 06/08/2018  . Cancer [facility] 2008   Colon   . Chronic diastolic (congestive) heart failure ([place])   . Diabetes mellitus without complication ([place])   . Gout 09/08/2018  . Hypertension   . Macrocytic anemia   . MDS (myelodysplastic syndrome) ([place])   . Stage 4 chronic kidney disease (HCC)      Family History  Problem Relation Age of Onset  . Hypertension Mother   . Diabetes Mother   . Cervical cancer Mother   . Heart attack Father   . Hypertension Sister   . Hypertension Brother   . Hypertension Sister   . Hypertension Sister   . Prostate cancer Brother   . HIV/AIDS Brother      Current Outpatient Medications:  .  levothyroxine (SYNTHROID) 137 MCG tablet, Take 1 tablet (137 mcg total) by mouth daily before breakfast., Disp: 30 tablet, Rfl: 1 .  allopurinol (ZYLOPRIM) 300 MG tablet, Take 300 mg by mouth daily., Disp: , Rfl:  .  amiodarone (PACERONE) 200 MG tablet, Take 1 tablet (200 mg total) by mouth daily., Disp: 90 tablet, Rfl: 3 .  atorvastatin (LIPITOR) 40 MG tablet, Take 1 tablet by mouth once daily, Disp: 90 tablet, Rfl: 0 .  blood glucose  meter kit and supplies, Dispense based on patient and insurance preference. Use up to four times daily as directed. (FOR ICD-10 E10.9, E11.9). (Patient taking differently: 1 each by Other route See admin instructions. Dispense based on patient and insurance preference. Use up to four times daily as directed. (FOR ICD-10 E10.9, E11.9).), Disp: 1 each, Rfl: 3 .  Blood Glucose Monitoring Suppl (ONETOUCH ULTRALINK) w/Device KIT, Check blood sugars twice daily E11.9 (Patient taking differently: 1 each by Other route See admin instructions. Check blood sugars twice daily E11.9), Disp: 1 kit, Rfl: 0 .  carvedilol (COREG) 12.5 MG tablet, Take 1 tablet (12.5 mg total) by mouth 2 (two) times daily., Disp: 180 tablet, Rfl: 3 .  cloNIDine (CATAPRES) 0.1 MG tablet, Take 1 tablet (0.1 mg total) by mouth 2 (two) times daily., Disp: 60 tablet, Rfl: 11 .  fluticasone (FLONASE) 50 MCG/ACT nasal spray, Place 1 spray into both nostrils daily., Disp: 16 g, Rfl: 2 .  furosemide (LASIX) 40 MG tablet, Take 1 tablet (40 mg total) by mouth daily. Take 80 mg in AM and 40 mg in PM (4PM), Disp: 90 tablet, Rfl: 0 .  glucose blood test strip, check blood sugars twice daily Dx code E11.22 (Patient taking differently: 1 each by Other route See admin instructions. check blood sugars twice daily Dx code E11.22),  Disp: 100 each, Rfl: 3 .  hydrALAZINE (APRESOLINE) 100 MG tablet, TAKE 1 TABLET BY MOUTH THREE TIMES DAILY, Disp: 90 tablet, Rfl: 0 .  Insulin Glargine (BASAGLAR KWIKPEN) 100 UNIT/ML, Inject 0.14 mLs (14 Units total) into the skin daily. (Patient taking differently: Inject 18 Units into the skin daily.), Disp: 5 pen, Rfl: 1 .  Insulin Pen Needle 29G X 5MM MISC, Use as directed (Patient taking differently: 1 each by Other route See admin instructions. Use as directed), Disp: 200 each, Rfl: 0 .  Lancets (ONETOUCH DELICA PLUS [id number]) MISC, check blood sugars twice daily Dx code: E11.22 (Patient taking differently: 1 each by Other  route See admin instructions. check blood sugars twice daily Dx code: E11.22), Disp: 100 each, Rfl: 3 .  latanoprost (XALATAN) 0.005 % ophthalmic solution, Place 1 drop into both eyes at bedtime. , Disp: , Rfl:  .  linagliptin (TRADJENTA) 5 MG TABS tablet, Take 1 tablet (5 mg total) by mouth daily., Disp: 90 tablet, Rfl: 1 .  loratadine (CLARITIN) 10 MG tablet, Take 10 mg by mouth daily., Disp: , Rfl:  .  Multiple Vitamins-Minerals (CENTRUM SILVER 50+WOMEN) TABS, Take 1 tablet by mouth daily., Disp: , Rfl:  .  sildenafil (REVATIO) 20 MG tablet, TAKE 1 TABLET BY MOUTH THREE TIMES DAILY, Disp: 90 tablet, Rfl: 0 .  sodium bicarbonate 650 MG tablet, Take 650 mg by mouth 2 (two) times daily., Disp: , Rfl:    Allergies  Allergen Reactions  . Ancef [Cefazolin] Itching    Severe itching- after procedure, ancef was the antibiotic.-04/02/17 Tolerates penicillins  . Lactose Intolerance (Gi) Diarrhea  . Sulfa Antibiotics Rash and Other (See Comments)    Blisters, also     Review of Systems  Constitutional: Negative for chills, fatigue and fever.  Respiratory: Negative for cough, chest tightness and shortness of breath.   Cardiovascular: Negative for chest pain.  Gastrointestinal: Negative for diarrhea and nausea.  Endocrine: Negative for cold intolerance, heat intolerance, polydipsia, polyphagia and polyuria.  Neurological: Negative for numbness.     Today's Vitals   08/10/20 1430  BP: (!) 118/58  Pulse: 60  Temp: 97.8 F (36.6 C)  TempSrc: Oral  Weight: 204 lb (92.5 kg)  Height: '5\' 4"'  (1.626 m)   Body mass index is 35.02 kg/m.   Objective:  Physical Exam Vitals reviewed.  Constitutional:      Appearance: She is obese.  HENT:     Head: Normocephalic and atraumatic.  Cardiovascular:     Rate and Rhythm: Normal rate and regular rhythm.     Pulses: Normal pulses.     Heart sounds: Murmur heard.    Pulmonary:     Effort: No respiratory distress.     Breath sounds: Normal breath  sounds. No wheezing.  Musculoskeletal:     Cervical back: Normal range of motion and neck supple. No tenderness.  Lymphadenopathy:     Cervical: No cervical adenopathy.  Skin:    General: Skin is warm and dry.  Neurological:     Mental Status: She is alert.         Assessment And Plan:     1. Acquired hypothyroidism -Based on her last TSH on 08/01/20, from the cardiologist we will increase her dosage of levothyroxine to 137 once daily. She will come in 4 Weeks for a repeat TSH, T3 and T4.  - levothyroxine (SYNTHROID) 137 MCG tablet; Take 1 tablet (137 mcg total) by mouth daily before breakfast.  Dispense: 30 tablet;  Refill: 1  2. Type 2 diabetes mellitus with stage 4 chronic kidney disease, without long-term current use of insulin (HCC) -Chronic, stable  -Continue med  - Hemoglobin A1c today   3. Pure hypercholesterolemia -Chronic, stable -Continue meds  Educated patient about eating a healthy by increasing her intake of fish and lean meat. Increase her fruits and vegetables. Avoid fast food.  - Lipid Profile today   4. Class 2 severe obesity due to excess calories with serious comorbidity and body mass index (BMI) of 35.0 to 35.9 in adult [facility])  -Educated patient to continue to practice a healthy lifestyle with diet and exercise.   Staying healthy and adopting a healthy lifestyle for your overall health is important. You should eat 7 or more servings of fruits and vegetables per day. You should drink plenty of water to keep yourself hydrated and your kidneys healthy. This includes about 65-80+ fluid ounces of water. Limit your intake of animal fats especially for elevated cholesterol. Avoid highly processed food and limit your salt intake if you have hypertension. Avoid foods high in saturated/Trans fats. Along with a healthy diet it is also very important to maintain time for yourself to maintain a healthy mental health with low stress levels. You should get atleast 150 min of  moderate intensity exercise weekly for a healthy heart. Along with eating right and exercising, aim for at least 7-9 hours of sleep daily.  Eat more whole grains which includes barley, wheat berries, oats, brown rice and whole wheat pasta. Use healthy plant oils which include olive, soy, corn, sunflower and peanut. Limit your caffeine and sugary drinks. Limit your intake of fast foods. Limit milk and dairy products to one or two daily servings.   Patient was given opportunity to ask questions. Patient verbalized understanding of the plan and was able to repeat key elements of the plan. All questions were answered to their satisfaction.  Bary Castilla, NP   I, Bary Castilla, NP, have reviewed all documentation for this visit. The documentation on 08/10/20 for the exam, diagnosis, procedures, and orders are all accurate and complete.  THE PATIENT IS ENCOURAGED TO PRACTICE SOCIAL DISTANCING DUE TO THE COVID-19 PANDEMIC.

## 2020-08-10 NOTE — Progress Notes (Signed)
I,Efton Thomley,acting as a Education administrator for [employer], NP.,have documented all relevant documentation on the behalf of [employer], NP,as directed by  Bary Castilla, NP while in the presence of Bary Castilla, NP.  This visit occurred during the SARS-CoV-2 public health emergency.  Safety protocols were in place, including screening questions prior to the visit, additional usage of staff PPE, and extensive cleaning of exam room while observing appropriate contact time as indicated for disinfecting solutions.  Subjective:     Patient ID: Patricia Mata , female    DOB: 12-06-1955 , 65 y.o.   MRN: [id number]   Chief Complaint  Patient presents with  . Thyroid Problem    HPI  Thyroid Problem     Past Medical History:  Diagnosis Date  . Acute renal failure (ARF) ([place]) 06/08/2018  . Cancer [place]) 2008   Colon   . Chronic diastolic (congestive) heart failure ([place])   . Diabetes mellitus without complication ([place])   . Gout 09/08/2018  . Hypertension   . Macrocytic anemia   . MDS (myelodysplastic syndrome) ([place])   . Stage 4 chronic kidney disease (HCC)      Family History  Problem Relation Age of Onset  . Hypertension Mother   . Diabetes Mother   . Cervical cancer Mother   . Heart attack Father   . Hypertension Sister   . Hypertension Brother   . Hypertension Sister   . Hypertension Sister   . Prostate cancer Brother   . HIV/AIDS Brother      Current Outpatient Medications:  .  allopurinol (ZYLOPRIM) 300 MG tablet, Take 300 mg by mouth daily., Disp: , Rfl:  .  amiodarone (PACERONE) 200 MG tablet, Take 1 tablet (200 mg total) by mouth daily., Disp: 90 tablet, Rfl: 3 .  atorvastatin (LIPITOR) 40 MG tablet, Take 1 tablet by mouth once daily, Disp: 90 tablet, Rfl: 0 .  blood glucose meter kit and supplies, Dispense based on patient and insurance preference. Use up to four times daily as directed. (FOR ICD-10 E10.9, E11.9). (Patient taking differently: 1  each by Other route See admin instructions. Dispense based on patient and insurance preference. Use up to four times daily as directed. (FOR ICD-10 E10.9, E11.9).), Disp: 1 each, Rfl: 3 .  Blood Glucose Monitoring Suppl (ONETOUCH ULTRALINK) w/Device KIT, Check blood sugars twice daily E11.9 (Patient taking differently: 1 each by Other route See admin instructions. Check blood sugars twice daily E11.9), Disp: 1 kit, Rfl: 0 .  carvedilol (COREG) 12.5 MG tablet, Take 1 tablet (12.5 mg total) by mouth 2 (two) times daily., Disp: 180 tablet, Rfl: 3 .  cloNIDine (CATAPRES) 0.1 MG tablet, Take 1 tablet (0.1 mg total) by mouth 2 (two) times daily., Disp: 60 tablet, Rfl: 11 .  fluticasone (FLONASE) 50 MCG/ACT nasal spray, Place 1 spray into both nostrils daily., Disp: 16 g, Rfl: 2 .  furosemide (LASIX) 40 MG tablet, Take 1 tablet (40 mg total) by mouth daily. Take 80 mg in AM and 40 mg in PM (4PM), Disp: 90 tablet, Rfl: 0 .  glucose blood test strip, check blood sugars twice daily Dx code E11.22 (Patient taking differently: 1 each by Other route See admin instructions. check blood sugars twice daily Dx code E11.22), Disp: 100 each, Rfl: 3 .  hydrALAZINE (APRESOLINE) 100 MG tablet, TAKE 1 TABLET BY MOUTH THREE TIMES DAILY, Disp: 90 tablet, Rfl: 0 .  Insulin Glargine (BASAGLAR KWIKPEN) 100 UNIT/ML, Inject 0.14 mLs (14 Units total) into the  skin daily. (Patient taking differently: Inject 18 Units into the skin daily.), Disp: 5 pen, Rfl: 1 .  Insulin Pen Needle 29G X 5MM MISC, Use as directed (Patient taking differently: 1 each by Other route See admin instructions. Use as directed), Disp: 200 each, Rfl: 0 .  Lancets (ONETOUCH DELICA PLUS [id number]) MISC, check blood sugars twice daily Dx code: E11.22 (Patient taking differently: 1 each by Other route See admin instructions. check blood sugars twice daily Dx code: E11.22), Disp: 100 each, Rfl: 3 .  latanoprost (XALATAN) 0.005 % ophthalmic solution, Place 1 drop into  both eyes at bedtime. , Disp: , Rfl:  .  levothyroxine (SYNTHROID) 125 MCG tablet, Take 1 tablet (125 mcg total) by mouth daily., Disp: 30 tablet, Rfl: 6 .  linagliptin (TRADJENTA) 5 MG TABS tablet, Take 1 tablet (5 mg total) by mouth daily., Disp: 90 tablet, Rfl: 1 .  loratadine (CLARITIN) 10 MG tablet, Take 10 mg by mouth daily., Disp: , Rfl:  .  Multiple Vitamins-Minerals (CENTRUM SILVER 50+WOMEN) TABS, Take 1 tablet by mouth daily., Disp: , Rfl:  .  sildenafil (REVATIO) 20 MG tablet, TAKE 1 TABLET BY MOUTH THREE TIMES DAILY, Disp: 90 tablet, Rfl: 0 .  sodium bicarbonate 650 MG tablet, Take 650 mg by mouth 2 (two) times daily., Disp: , Rfl:    Allergies  Allergen Reactions  . Ancef [Cefazolin] Itching    Severe itching- after procedure, ancef was the antibiotic.-04/02/17 Tolerates penicillins  . Lactose Intolerance (Gi) Diarrhea  . Sulfa Antibiotics Rash and Other (See Comments)    Blisters, also     Review of Systems  Constitutional: Negative.   Respiratory: Negative.   Cardiovascular: Negative.   Gastrointestinal: Negative.   Neurological: Negative.      Today's Vitals   08/10/20 1430  BP: (!) 118/58  Pulse: 60  Temp: 97.8 F (36.6 C)  TempSrc: Oral  Weight: 204 lb (92.5 kg)  Height: '5\' 4"'  (1.626 m)   Body mass index is 35.02 kg/m.   Objective:  Physical Exam      Assessment And Plan:     1. Acquired hypothyroidism  2. Class 2 severe obesity due to excess calories with serious comorbidity and body mass index (BMI) of 35.0 to 35.9 in adult [facility])     Patient was given opportunity to ask questions. Patient verbalized understanding of the plan and was able to repeat key elements of the plan. All questions were answered to their satisfaction.  Octavio Manns   I, Octavio Manns, have reviewed all documentation for this visit. The documentation on 08/10/20 for the exam, diagnosis, procedures, and orders are all accurate and complete.  THE PATIENT IS ENCOURAGED TO  PRACTICE SOCIAL DISTANCING DUE TO THE COVID-19 PANDEMIC.

## 2020-08-11 ENCOUNTER — Inpatient Hospital Stay: Payer: Medicare Other

## 2020-08-11 ENCOUNTER — Other Ambulatory Visit: Payer: Self-pay

## 2020-08-11 ENCOUNTER — Inpatient Hospital Stay (HOSPITAL_BASED_OUTPATIENT_CLINIC_OR_DEPARTMENT_OTHER): Payer: Medicare Other | Admitting: Oncology

## 2020-08-11 ENCOUNTER — Telehealth: Payer: Self-pay

## 2020-08-11 VITALS — BP 146/45 | HR 52 | Temp 98.2°F | Resp 18

## 2020-08-11 VITALS — BP 141/41 | HR 60 | Temp 96.2°F | Resp 18 | Ht 64.0 in | Wt 204.0 lb

## 2020-08-11 DIAGNOSIS — D649 Anemia, unspecified: Secondary | ICD-10-CM

## 2020-08-11 DIAGNOSIS — D469 Myelodysplastic syndrome, unspecified: Secondary | ICD-10-CM

## 2020-08-11 LAB — SAMPLE TO BLOOD BANK

## 2020-08-11 LAB — CBC WITH DIFFERENTIAL (CANCER CENTER ONLY)
Abs Immature Granulocytes: 0.09 10*3/uL — ABNORMAL HIGH (ref 0.00–0.07)
Basophils Absolute: 0 10*3/uL (ref 0.0–0.1)
Basophils Relative: 0 %
Eosinophils Absolute: 0 10*3/uL (ref 0.0–0.5)
Eosinophils Relative: 1 %
HCT: 20.3 % — ABNORMAL LOW (ref 36.0–46.0)
Hemoglobin: 6.6 g/dL — CL (ref 12.0–15.0)
Immature Granulocytes: 2 %
Lymphocytes Relative: 11 %
Lymphs Abs: 0.6 10*3/uL — ABNORMAL LOW (ref 0.7–4.0)
MCH: 31.1 pg (ref 26.0–34.0)
MCHC: 32.5 g/dL (ref 30.0–36.0)
MCV: 95.8 fL (ref 80.0–100.0)
Monocytes Absolute: 0.8 10*3/uL (ref 0.1–1.0)
Monocytes Relative: 15 %
Neutro Abs: 3.8 10*3/uL (ref 1.7–7.7)
Neutrophils Relative %: 71 %
Platelet Count: 183 10*3/uL (ref 150–400)
RBC: 2.12 MIL/uL — ABNORMAL LOW (ref 3.87–5.11)
RDW: 22.2 % — ABNORMAL HIGH (ref 11.5–15.5)
WBC Count: 5.3 10*3/uL (ref 4.0–10.5)
nRBC: 3.8 % — ABNORMAL HIGH (ref 0.0–0.2)

## 2020-08-11 LAB — LIPID PANEL
Chol/HDL Ratio: 2.9 ratio (ref 0.0–4.4)
Cholesterol, Total: 168 mg/dL (ref 100–199)
HDL: 58 mg/dL (ref >39)
LDL Chol Calc (NIH): 92 mg/dL (ref 0–99)
Triglycerides: 96 mg/dL (ref 0–149)
VLDL Cholesterol Cal: 18 mg/dL (ref 5–40)

## 2020-08-11 LAB — CMP (CANCER CENTER ONLY)
ALT: 18 U/L (ref 0–44)
AST: 33 U/L (ref 15–41)
Albumin: 3.7 g/dL (ref 3.5–5.0)
Alkaline Phosphatase: 75 U/L (ref 38–126)
Anion gap: 15 (ref 5–15)
BUN: 117 mg/dL — ABNORMAL HIGH (ref 8–23)
CO2: 24 mmol/L (ref 22–32)
Calcium: 9.3 mg/dL (ref 8.9–10.3)
Chloride: 101 mmol/L (ref 98–111)
Creatinine: 5.19 mg/dL (ref 0.44–1.00)
GFR, Estimated: 9 mL/min — ABNORMAL LOW (ref >60)
Glucose, Bld: 124 mg/dL — ABNORMAL HIGH (ref 70–99)
Potassium: 3.9 mmol/L (ref 3.5–5.1)
Sodium: 140 mmol/L (ref 135–145)
Total Bilirubin: 0.6 mg/dL (ref 0.3–1.2)
Total Protein: 7.1 g/dL (ref 6.5–8.1)

## 2020-08-11 LAB — HEMOGLOBIN A1C
Est. average glucose Bld gHb Est-mCnc: 126 mg/dL
Hgb A1c MFr Bld: 6 % — ABNORMAL HIGH (ref 4.8–5.6)

## 2020-08-11 LAB — PREPARE RBC (CROSSMATCH)

## 2020-08-11 MED ORDER — DIPHENHYDRAMINE HCL 25 MG PO CAPS
ORAL_CAPSULE | ORAL | Status: AC
  Filled 2020-08-11: qty 1

## 2020-08-11 MED ORDER — FUROSEMIDE 10 MG/ML IJ SOLN
INTRAMUSCULAR | Status: AC
  Filled 2020-08-11: qty 2

## 2020-08-11 MED ORDER — LUSPATERCEPT-AAMT 75 MG ~~LOC~~ SOLR
1.7500 mg/kg | Freq: Once | SUBCUTANEOUS | Status: AC
  Administered 2020-08-11: 150 mg via SUBCUTANEOUS
  Filled 2020-08-11: qty 3

## 2020-08-11 MED ORDER — ACETAMINOPHEN 325 MG PO TABS
ORAL_TABLET | ORAL | Status: AC
  Filled 2020-08-11: qty 2

## 2020-08-11 MED ORDER — SODIUM CHLORIDE 0.9% IV SOLUTION
250.0000 mL | Freq: Once | INTRAVENOUS | Status: AC
  Administered 2020-08-11: 250 mL via INTRAVENOUS
  Filled 2020-08-11: qty 250

## 2020-08-11 MED ORDER — ACETAMINOPHEN 325 MG PO TABS
650.0000 mg | ORAL_TABLET | Freq: Once | ORAL | Status: AC
  Administered 2020-08-11: 650 mg via ORAL

## 2020-08-11 MED ORDER — FUROSEMIDE 10 MG/ML IJ SOLN
20.0000 mg | Freq: Once | INTRAMUSCULAR | Status: AC
  Administered 2020-08-11: 20 mg via INTRAVENOUS

## 2020-08-11 MED ORDER — DIPHENHYDRAMINE HCL 25 MG PO CAPS
25.0000 mg | ORAL_CAPSULE | Freq: Once | ORAL | Status: AC
  Administered 2020-08-11: 25 mg via ORAL

## 2020-08-11 NOTE — Progress Notes (Signed)
CRITICAL VALUE STICKER  CRITICAL VALUE: Hemoglobin 6.6  RECEIVER (on-site recipient of call):Patricia Mata Rosana Hoes, [place] NOTIFIED: 08/11/20 @ [place] (representative from lab): M. Nicole Kindred  MD NOTIFIED: Dr. Zola Button  TIME OF NOTIFICATION: [phone]  RESPONSE: Acknowledged. Patient to receive 2 units PRBC's today and injection as scheduled. Infusion RN made aware.

## 2020-08-11 NOTE — Patient Instructions (Signed)

## 2020-08-11 NOTE — Progress Notes (Signed)
Hematology and Oncology Follow Up Visit  Patricia Mata [id number] September 27, 1955 65 y.o. 08/11/2020 8:54 AM   Principle Diagnosis: 65 year old woman with MDS diagnosed in 2017 presented with IPSS-R score of 2.5 and symptomatic anemia.    Secondary diagnosis: Colon cancer diagnosed in 2008.  She was found to have stage III disease without any evidence of recurrence.    Prior Therapy: She is S/P hemicolectomy done in 11/2006. 1/13 lymph nodes are positive.  This was followed by 12 cycles of FOLFOX completed in 05/2007  Aranesp 300 mcg every 2 to 3 weeks therapy was ineffective to eliminate transfusion needs.  5-azacytidine at 75 mg/m square subcutaneously started in October 2018.  She is status post 7 cycles of therapy completed in April 2019.   Current therapy:   Reblozyl 75 mg subcutaneous injection every 3 weeks started in December 2020.    Supportive affect red cell transfusion as needed.   Interim History:  Patricia Mata is here for a repeat follow up.  Since her last visit, she reports no major changes in her health.  She was hospitalized in December 2021 for worsening renal failure.  Since her discharge she has felt reasonably fair without any decline in her energy and performance status.  She ambulates short distances without any chest pain or difficulty breathing.  Performance status quality of life remains stable.   Updated on review  Current Outpatient Medications on File Prior to Visit  Medication Sig Dispense Refill  . allopurinol (ZYLOPRIM) 100 MG tablet Take 100 mg by mouth daily.    Marland Kitchen amiodarone (PACERONE) 200 MG tablet Take 200 mg by mouth daily.    Marland Kitchen amLODipine (NORVASC) 10 MG tablet Take 1 tablet (10 mg total) by mouth daily. 90 tablet 3  . atorvastatin (LIPITOR) 40 MG tablet TAKE 1 TABLET BY MOUTH EVERY DAY 90 tablet 0  . blood glucose meter kit and supplies Dispense based on patient and insurance preference. Use up to four times daily as directed. (FOR ICD-10  E10.9, E11.9). 1 each 3  . colchicine 0.6 MG tablet Take 0.6 mg by mouth 2 (two) times daily as needed (for gout).    Marland Kitchen doxazosin (CARDURA) 1 MG tablet Take 1 tablet (1 mg total) by mouth at bedtime. 30 tablet 6  . doxycycline (ADOXA) 100 MG tablet Take 1 tablet (100 mg total) by mouth 2 (two) times daily for 7 days. 14 tablet 0  . furosemide (LASIX) 40 MG tablet TAKE 1 TABLET BY MOUTH EVERY DAY 90 tablet 1  . gabapentin (NEURONTIN) 300 MG capsule     . glucose blood test strip Use as instructed 100 each 12  . hydrALAZINE (APRESOLINE) 100 MG tablet Take 1 tablet (100 mg total) by mouth 3 (three) times daily. 180 tablet 5  . insulin glargine (LANTUS) 100 unit/mL SOPN Inject 0.15 mLs (15 Units total) into the skin daily. (Patient taking differently: Inject 15 Units into the skin daily as needed (blood sugar over 150). )    . Insulin Pen Needle 29G X 5MM MISC Use as directed 200 each 0  . latanoprost (XALATAN) 0.005 % ophthalmic solution Place 1 drop into both eyes at bedtime.     Marland Kitchen levothyroxine (SYNTHROID) 88 MCG tablet Take 1 tablet (88 mcg total) by mouth daily. 30 tablet 2  . metoprolol succinate (TOPROL-XL) 25 MG 24 hr tablet Take 1 tablet (25 mg total) by mouth daily. STOP taking this unless heart rate >100bpm OR directed by your doctor 30  tablet 5  . Multiple Vitamins-Minerals (CENTRUM SILVER 50+WOMEN) TABS Take 1 tablet by mouth daily.    . sildenafil (REVATIO) 20 MG tablet Take 1 tablet (20 mg total) by mouth 3 (three) times daily. 270 tablet 3  . sitaGLIPtin (JANUVIA) 50 MG tablet Take 1 tablet (50 mg total) by mouth daily. 90 tablet 0               Allergies:  Allergies  Allergen Reactions  . Ancef [Cefazolin] Itching    Severe itching- after procedure, ancef was the antibiotic.-04/02/17 Tolerates penicillins  . Lactose Intolerance (Gi) Diarrhea  . Sulfa Antibiotics Rash and Other (See Comments)    Blisters, also     Physical Exam:   Blood pressure (!) 141/41,  pulse 60, temperature (!) 96.2 F (35.7 C), temperature source Tympanic, resp. rate 18, height '5\' 4"'  (1.626 m), weight 204 lb (92.5 kg), SpO2 100 %.         ECOG: 1    General appearance: Comfortable appearing without any discomfort Head: Normocephalic without any trauma Oropharynx: Mucous membranes are moist and pink without any thrush or ulcers. Eyes: Pupils are equal and round reactive to light. Lymph nodes: No cervical, supraclavicular, inguinal or axillary lymphadenopathy.   Heart:regular rate and rhythm.  S1 and S2 without leg edema. Lung: Clear without any rhonchi or wheezes.  No dullness to percussion. Abdomin: Soft, nontender, nondistended with good bowel sounds.  No hepatosplenomegaly. Musculoskeletal: No joint deformity or effusion.  Full range of motion noted. Neurological: No deficits noted on motor, sensory and deep tendon reflex exam. Skin: No petechial rash or dryness.  Appeared moist.                       Lab Results: Lab Results  Component Value Date   WBC 3.8 (L) 07/20/2020   HGB 9.2 (L) 07/20/2020   HCT 28.3 (L) 07/20/2020   MCV 94.0 07/20/2020   PLT 187 07/20/2020     Chemistry      Component Value Date/Time   NA 144 07/20/2020 1042   NA 132 (L) 06/21/2020 1459   NA 134 (L) 05/26/2017 0949   K 3.7 07/20/2020 1042   K 4.0 05/26/2017 0949   CL 107 07/20/2020 1042   CL 96 (L) 03/31/2012 1513   CO2 25 07/20/2020 1042   CO2 24 05/26/2017 0949   BUN 79 (H) 07/20/2020 1042   BUN 100 (HH) 06/21/2020 1459   BUN 29.2 (H) 05/26/2017 0949   CREATININE 3.70 (HH) 07/20/2020 1042   CREATININE 2.24 (H) 01/27/2019 1355   CREATININE 1.4 (H) 05/26/2017 0949      Component Value Date/Time   CALCIUM 9.7 07/20/2020 1042   CALCIUM 8.9 05/26/2017 0949   ALKPHOS 79 07/20/2020 1042   ALKPHOS 157 (H) 05/26/2017 0949   AST 33 07/20/2020 1042   AST 29 05/26/2017 0949   ALT 16 07/20/2020 1042   ALT 23 05/26/2017 0949   BILITOT 1.0 07/20/2020  1042   BILITOT 2.19 (H) 05/26/2017 0949      Impression and Plan:  65 year old woman with:   1.  MDS presented with IPSS-R score of 2.5 diagnosed in 2017.  She is currently on Reblozyl which has a decreased her transfusion dependence but has not eliminated.  Risks and benefits of continuing this treatment were discussed and she is agreeable to continue.  2.  Anemia: She has been no transfusion dependent although less so as of late.  Hemoglobin  today is 6.6 and she will need 2 units of packed red cell transfusion.  We will continue to monitor an every 3-week basis and transfuse as needed.   3. Hypertension: Within normal range at this time.   4.  Aortic stenosis: Continues to follow with cardiology regarding this issue without any decompensated heart failure.   5.  Prognosis: Any treatment is palliative prognosis overall guarded given her multiple comorbid conditions.   6. Follow-up: She will return every 3 weeks for possible transfusion and injection and MD follow-up in 3 months.  30  minutes were dedicated to this encounter.  Time was spent on reviewing laboratory data, disease status update and future treatment options.   Zola Button, MD 1/28/20228:54 AM

## 2020-08-11 NOTE — Telephone Encounter (Signed)
CRITICAL VALUE STICKER  CRITICAL VALUE: Creatinine 5.2  RECEIVER (on-site recipient of call): Wendall Mola, RN  [place] NOTIFIED: 08/11/20 @ [phone]  MESSENGER (representative from lab): RB  MD NOTIFIED: Dr. Zola Button  TIME OF NOTIFICATION: [phone]  RESPONSE: Acknowledged

## 2020-08-12 LAB — TYPE AND SCREEN
ABO/RH(D): A NEG
Antibody Screen: NEGATIVE
Unit division: 0
Unit division: 0

## 2020-08-12 LAB — BPAM RBC
Blood Product Expiration Date: 202202162359
Blood Product Expiration Date: 202202172359
ISSUE DATE / TIME: 202201281050
ISSUE DATE / TIME: 202201281050
Unit Type and Rh: 600
Unit Type and Rh: 600

## 2020-08-15 ENCOUNTER — Ambulatory Visit (INDEPENDENT_AMBULATORY_CARE_PROVIDER_SITE_OTHER): Payer: Medicare Other | Admitting: Emergency Medicine

## 2020-08-15 ENCOUNTER — Encounter: Payer: Self-pay | Admitting: Emergency Medicine

## 2020-08-15 ENCOUNTER — Other Ambulatory Visit: Payer: Self-pay

## 2020-08-15 DIAGNOSIS — R053 Chronic cough: Secondary | ICD-10-CM | POA: Diagnosis not present

## 2020-08-15 NOTE — Patient Instructions (Signed)
We will not start any new medications at this time. Please call our office if your cough returns, worsens so that we can see you and evaluate further.

## 2020-08-15 NOTE — Progress Notes (Signed)
Subjective:    Patient ID: Patricia Mata, female    DOB: April 09, 1956, 65 y.o.   MRN: [id number]  HPI  ROV 05/04/20 --this follow-up visit for patient with multiple medical problems including hypertension, atrial stenosis, A. fib on amiodarone and secondary pulmonary hypertension (sildenafil), chronic renal insufficiency, myelodysplastic syndrome, prior colon cancer.  She had COVID-19 in 03/2019 subsequent CAP 09/2019.  She has kept cough, purulent sputum.  May have benefited some from loratadine, fluticasone nasal spray, guaifenesin - out of the nasal steroid, stopped loratadine, mucinex.  Sputum culture from 04/14/2020 showed normal flora. The cough is worst at night, can wake her from sleep. No overt GERD sx.   ROV 08/15/20 --65 year old woman with a history of hypertension, A. fib on amiodarone, chronic renal insufficiency, mild dysplastic syndrome, secondary pulmonary hypertension on sildenafil.  She is been admitted twice for acute on chronic diastolic CHF since I last saw her in October. Her diuretics have been adjusted by Dr Haroldine Laws.  She also deals with chronic cough and we have tried treating allergic rhinitis, GERD.  Her cough is better, especially at night, but can still happen some each day. Productive of light yellow. She is off PPI, and allergy routine -- wasn't sure they helped her very much.    Review of Systems As per HPI  Past Medical History:  Diagnosis Date  . Acute renal failure (ARF) ([place]) 06/08/2018  . Cancer [facility]) 2008   Colon   . Chronic diastolic (congestive) heart failure ([place])   . Diabetes mellitus without complication ([place])   . Gout 09/08/2018  . Hypertension   . Macrocytic anemia   . MDS (myelodysplastic syndrome) ([place])   . Stage 4 chronic kidney disease (H[facility])      Family History  Problem Relation Age of Onset  . Hypertension Mother   . Diabetes Mother   . Cervical cancer Mother   . Heart attack Father   . Hypertension Sister   . Hypertension  Brother   . Hypertension Sister   . Hypertension Sister   . Prostate cancer Brother   . HIV/AIDS Brother      Social History   Socioeconomic History  . Marital status: Married    Spouse name: Not on file  . Number of children: Not on file  . Years of education: Not on file  . Highest education level: Not on file  Occupational History  . Occupation: retired  Tobacco Use  . Smoking status: Former Smoker    Packs/day: 0.25    Years: 20.00    Pack years: 5.00    Types: Cigarettes    Quit date: 01/31/2001    Years since quitting: 19.5  . Smokeless tobacco: Former Network engineer  . Vaping Use: Never used  Substance and Sexual Activity  . Alcohol use: Yes    Alcohol/week: 2.0 standard drinks    Types: 2 Shots of liquor per week  . Drug use: Never  . Sexual activity: Not on file  Other Topics Concern  . Not on file  Social History Narrative  . Not on file   Social Determinants of Health   Financial Resource Strain: Medium Risk  . Difficulty of Paying Living Expenses: Somewhat hard  Food Insecurity: Not on file  Transportation Needs: Not on file  Physical Activity: Not on file  Stress: Not on file  Social Connections: Not on file  Intimate Partner Violence: Not on file     Allergies  Allergen Reactions  .  Ancef [Cefazolin] Itching    Severe itching- after procedure, ancef was the antibiotic.-04/02/17 Tolerates penicillins  . Lactose Intolerance (Gi) Diarrhea  . Sulfa Antibiotics Rash and Other (See Comments)    Blisters, also     Outpatient Medications Prior to Visit  Medication Sig Dispense Refill  . allopurinol (ZYLOPRIM) 300 MG tablet Take 300 mg by mouth daily.    Marland Kitchen amiodarone (PACERONE) 200 MG tablet Take 1 tablet (200 mg total) by mouth daily. 90 tablet 3  . atorvastatin (LIPITOR) 40 MG tablet Take 1 tablet by mouth once daily 90 tablet 0  . blood glucose meter kit and supplies Dispense based on patient and insurance preference. Use up to four times  daily as directed. (FOR ICD-10 E10.9, E11.9). (Patient taking differently: 1 each by Other route See admin instructions. Dispense based on patient and insurance preference. Use up to four times daily as directed. (FOR ICD-10 E10.9, E11.9).) 1 each 3  . Blood Glucose Monitoring Suppl (ONETOUCH ULTRALINK) w/Device KIT Check blood sugars twice daily E11.9 (Patient taking differently: 1 each by Other route See admin instructions. Check blood sugars twice daily E11.9) 1 kit 0  . carvedilol (COREG) 12.5 MG tablet Take 1 tablet (12.5 mg total) by mouth 2 (two) times daily. 180 tablet 3  . cloNIDine (CATAPRES) 0.1 MG tablet Take 1 tablet (0.1 mg total) by mouth 2 (two) times daily. 60 tablet 11  . furosemide (LASIX) 40 MG tablet Take 1 tablet (40 mg total) by mouth daily. Take 80 mg in AM and 40 mg in PM (4PM) 90 tablet 0  . glucose blood test strip check blood sugars twice daily Dx code E11.22 (Patient taking differently: 1 each by Other route See admin instructions. check blood sugars twice daily Dx code E11.22) 100 each 3  . hydrALAZINE (APRESOLINE) 100 MG tablet TAKE 1 TABLET BY MOUTH THREE TIMES DAILY 90 tablet 0  . Insulin Glargine (BASAGLAR KWIKPEN) 100 UNIT/ML Inject 0.14 mLs (14 Units total) into the skin daily. (Patient taking differently: Inject 18 Units into the skin daily.) 5 pen 1  . Insulin Pen Needle 29G X 5MM MISC Use as directed (Patient taking differently: 1 each by Other route See admin instructions. Use as directed) 200 each 0  . Lancets (ONETOUCH DELICA PLUS [id number]) MISC check blood sugars twice daily Dx code: E11.22 (Patient taking differently: 1 each by Other route See admin instructions. check blood sugars twice daily Dx code: E11.22) 100 each 3  . latanoprost (XALATAN) 0.005 % ophthalmic solution Place 1 drop into both eyes at bedtime.     Marland Kitchen levothyroxine (SYNTHROID) 137 MCG tablet Take 1 tablet (137 mcg total) by mouth daily before breakfast. 30 tablet 1  . linagliptin (TRADJENTA) 5  MG TABS tablet Take 1 tablet (5 mg total) by mouth daily. 90 tablet 1  . Multiple Vitamins-Minerals (CENTRUM SILVER 50+WOMEN) TABS Take 1 tablet by mouth daily.    . sildenafil (REVATIO) 20 MG tablet TAKE 1 TABLET BY MOUTH THREE TIMES DAILY 90 tablet 0  . sodium bicarbonate 650 MG tablet Take 650 mg by mouth 2 (two) times daily.    . fluticasone (FLONASE) 50 MCG/ACT nasal spray Place 1 spray into both nostrils daily. (Patient not taking: Reported on 08/15/2020) 16 g 2  . loratadine (CLARITIN) 10 MG tablet Take 10 mg by mouth daily. (Patient not taking: Reported on 08/15/2020)     No facility-administered medications prior to visit.         Objective:  Physical Exam Vitals:   08/15/20 1441  BP: (!) 144/84  Pulse: 64  Temp: 98.4 F (36.9 C)  SpO2: 100%  Weight: 206 lb 12.8 oz (93.8 kg)  Height: _0  (1.626 m)   Gen: Pleasant, obese woman, in no distress,  normal affect  ENT: No lesions,  mouth clear,  oropharynx clear, no postnasal drip, strong voice  Neck: No JVD, no stridor  Lungs: No use of accessory muscles, clear B  Cardiovascular: RRR, blowing holosystolic M, no peripheral edema  Musculoskeletal: No deformities, no cyanosis or clubbing  Neuro: alert, awake, non focal  Skin: Warm, no lesions or rash      Assessment & Plan:  Chronic cough Chronic cough overall better.  Question whether some of this may have related to her volume status which is much improved with current cardiac and diuretic regimen.  She is off GERD therapy, off rhinitis therapy and tolerating.  If her cough returns and we could consider restarting one or both.  Her spirometry is reassuring. She will follow-up as needed.  Baltazar Apo, MD, PhD 08/15/2020, 3:09 PM  Pulmonary and Critical Care [phone] or if no answer [phone]

## 2020-08-15 NOTE — Assessment & Plan Note (Signed)
Chronic cough overall better.  Question whether some of this may have related to her volume status which is much improved with current cardiac and diuretic regimen.  She is off GERD therapy, off rhinitis therapy and tolerating.  If her cough returns and we could consider restarting one or both.  Her spirometry is reassuring. She will follow-up as needed.

## 2020-08-16 ENCOUNTER — Ambulatory Visit: Payer: BC Managed Care – PPO | Admitting: Emergency Medicine

## 2020-08-17 ENCOUNTER — Ambulatory Visit: Payer: BC Managed Care – PPO

## 2020-08-17 NOTE — Progress Notes (Incomplete)
Patient ID: Patricia Mata                 DOB: November 09, 1955                      MRN: [id number]     HPI:  [name] is a 65 y.o. female referred by Dr. Oval Linsey to HTN clinic. PMH includes aortic stenosis, pulmonary hypertension, atrial fibrillation, CKD- IV, hypertension, DM, gout, anemia,and prior colon cancer s/p hemicolectomy.Patient had a recent acute care admission for HF exacerbation and acute on chronic AKI.  Per nephrology consult while in-patient  "recurrent volume overload particularly in the setting of hypertension could be a reason to consider intervention on the renal artery stenosis".   Patient is feeling better , but reports recent weigh gain. She called Dr Missy Sabins office and was instructed to increase her furosemide for few days. Was supposed to repeat BMET 1 week after diuresis, but no repeat BMET done yet. She is very pleased about not having BP in 200s and 180s anymore. Noted she is ale to use [employer] app now and started to record BP readings on Nov/30.   Current HTN meds:  Amlodipine 75m daily - D/C?? Carvedilol 12.585mtwice daily Clonidine 0.65m85mwice daily Doxazosin 8mg42mily - DC?? Furosemide 80 in AM and 40 in PM Hydralazine 100mg365m  Previously tried:   BP goal: <130/80 (140/90) if patient unable to tolerate more aggressive BP lowering regimen.  Family History: mother died at 82 fr42 massive MI, was healthy until then; father probable MI about 45 ye89s ago, was probably in his late 40's;58'sest sister with hypertension, another with hypertension; (4 sis, 3 bro); 1 son (fireArmed forces logistics/support/administrative officerh some valve issues (pt unsure) since childhood  Social History: no tobacco, 2 mixed drinks per night; drinks decaf coffee  Diet: mostly home cooked; rarely fries food; plenty of vegetables, fewer fruits  Exercise: loves exercise class with Pam; using exercise bike at home occasonally; wants to continue, ordered DVD - aerobics for seniors   Home BP readings:  5  readings in [place] average 138/57; HR range 53-55bpm 10 readings on record from home (Nov/29 to Dec/8); average 136/56  Wt Readings from Last 3 Encounters:  08/15/20 206 lb 12.8 oz (93.8 kg)  08/11/20 204 lb (92.5 kg)  08/10/20 204 lb (92.5 kg)   BP Readings from Last 3 Encounters:  08/15/20 (!) 144/84  08/11/20 (!) 146/45  08/11/20 (!) 141/41   Pulse Readings from Last 3 Encounters:  08/15/20 64  08/11/20 (!) 52  08/11/20 60    Renal function: Estimated Creatinine Clearance: 12 mL/min (A) (by C-G formula based on SCr of 5.19 mg/dL (HH)).  Past Medical History:  Diagnosis Date  . Acute renal failure (ARF) (H[facility]) [place]25/2019  . [facility]   Colon   . Chronic diastolic (congestive) heart failure (HCC) [place] Diabetes mellitus without complication (HCC) [place] Gout 09/08/2018  . Hypertension   . Macrocytic anemia   . MDS (myelodysplastic syndrome) (HCC) [place] Stage 4 chronic kidney disease (HCC) [place] Current Outpatient Medications on File Prior to Visit  Medication Sig Dispense Refill  . allopurinol (ZYLOPRIM) 300 MG tablet Take 300 mg by mouth daily.    . amiMarland Kitchendarone (PACERONE) 200 MG tablet Take 1 tablet (200 mg total) by mouth daily. 90 tablet 3  . atorvastatin (LIPITOR) 40 MG tablet Take 1 tablet by mouth once daily 90  tablet 0  . blood glucose meter kit and supplies Dispense based on patient and insurance preference. Use up to four times daily as directed. (FOR ICD-10 E10.9, E11.9). (Patient taking differently: 1 each by Other route See admin instructions. Dispense based on patient and insurance preference. Use up to four times daily as directed. (FOR ICD-10 E10.9, E11.9).) 1 each 3  . Blood Glucose Monitoring Suppl (ONETOUCH ULTRALINK) w/Device KIT Check blood sugars twice daily E11.9 (Patient taking differently: 1 each by Other route See admin instructions. Check blood sugars twice daily E11.9) 1 kit 0  . carvedilol (COREG) 12.5 MG tablet Take 1 tablet (12.5 mg total) by  mouth 2 (two) times daily. 180 tablet 3  . cloNIDine (CATAPRES) 0.1 MG tablet Take 1 tablet (0.1 mg total) by mouth 2 (two) times daily. 60 tablet 11  . fluticasone (FLONASE) 50 MCG/ACT nasal spray Place 1 spray into both nostrils daily. (Patient not taking: Reported on 08/15/2020) 16 g 2  . furosemide (LASIX) 40 MG tablet Take 1 tablet (40 mg total) by mouth daily. Take 80 mg in AM and 40 mg in PM (4PM) 90 tablet 0  . glucose blood test strip check blood sugars twice daily Dx code E11.22 (Patient taking differently: 1 each by Other route See admin instructions. check blood sugars twice daily Dx code E11.22) 100 each 3  . hydrALAZINE (APRESOLINE) 100 MG tablet TAKE 1 TABLET BY MOUTH THREE TIMES DAILY 90 tablet 0  . Insulin Glargine (BASAGLAR KWIKPEN) 100 UNIT/ML Inject 0.14 mLs (14 Units total) into the skin daily. (Patient taking differently: Inject 18 Units into the skin daily.) 5 pen 1  . Insulin Pen Needle 29G X 5MM MISC Use as directed (Patient taking differently: 1 each by Other route See admin instructions. Use as directed) 200 each 0  . Lancets (ONETOUCH DELICA PLUS [id number]) MISC check blood sugars twice daily Dx code: E11.22 (Patient taking differently: 1 each by Other route See admin instructions. check blood sugars twice daily Dx code: E11.22) 100 each 3  . latanoprost (XALATAN) 0.005 % ophthalmic solution Place 1 drop into both eyes at bedtime.     Marland Kitchen levothyroxine (SYNTHROID) 137 MCG tablet Take 1 tablet (137 mcg total) by mouth daily before breakfast. 30 tablet 1  . linagliptin (TRADJENTA) 5 MG TABS tablet Take 1 tablet (5 mg total) by mouth daily. 90 tablet 1  . loratadine (CLARITIN) 10 MG tablet Take 10 mg by mouth daily. (Patient not taking: Reported on 08/15/2020)    . Multiple Vitamins-Minerals (CENTRUM SILVER 50+WOMEN) TABS Take 1 tablet by mouth daily.    . sildenafil (REVATIO) 20 MG tablet TAKE 1 TABLET BY MOUTH THREE TIMES DAILY 90 tablet 0  . sodium bicarbonate 650 MG tablet Take  650 mg by mouth 2 (two) times daily.     No current facility-administered medications on file prior to visit.    Allergies  Allergen Reactions  . Ancef [Cefazolin] Itching    Severe itching- after procedure, ancef was the antibiotic.-04/02/17 Tolerates penicillins  . Lactose Intolerance (Gi) Diarrhea  . Sulfa Antibiotics Rash and Other (See Comments)    Blisters, also    There were no vitals taken for this visit.  No problem-specific Assessment & Plan notes found for this encounter.    Raquel Rodriguez-Guzman PharmD, BCPS, [place] [id number] 08/17/2020 12:08 PM

## 2020-08-22 ENCOUNTER — Telehealth: Payer: Self-pay

## 2020-08-22 NOTE — Progress Notes (Signed)
08/22/20-Notified the patient of upcoming CCM Call appointment 08/23/20 at 1:00 pm with Orlando Penner, CPP. Made the patient aware to have medications and supplements near during phone visit. The patient verbalized understanding.  Orlando Penner, CPP Notified.  Raynelle Highland, [place] Pharmacist Assistant [phone]

## 2020-08-23 ENCOUNTER — Ambulatory Visit: Payer: Medicare Other | Admitting: Cardiovascular Disease

## 2020-08-23 ENCOUNTER — Ambulatory Visit (INDEPENDENT_AMBULATORY_CARE_PROVIDER_SITE_OTHER): Payer: BC Managed Care – PPO

## 2020-08-23 DIAGNOSIS — N184 Chronic kidney disease, stage 4 (severe): Secondary | ICD-10-CM | POA: Diagnosis not present

## 2020-08-23 DIAGNOSIS — I10 Essential (primary) hypertension: Secondary | ICD-10-CM | POA: Diagnosis not present

## 2020-08-23 DIAGNOSIS — E1122 Type 2 diabetes mellitus with diabetic chronic kidney disease: Secondary | ICD-10-CM | POA: Diagnosis not present

## 2020-08-23 DIAGNOSIS — E7800 Pure hypercholesterolemia, unspecified: Secondary | ICD-10-CM

## 2020-08-23 NOTE — Progress Notes (Signed)
Chronic Care Management Pharmacy Note  09/06/2020 Name:  Patricia Mata MRN:  [id number] DOB:  12/24/1955  Subjective: Patricia Mata is an 65 y.o. year old female who is a primary patient of [name], [place].  The CCM team was consulted for assistance with disease management and care coordination needs.    Engaged with patient by telephone for follow up visit in response to provider referral for pharmacy case management and/or care coordination services.   Consent to Services:  The patient was given the following information about Chronic Care Management services today, agreed to services, and gave verbal consent: 1. CCM service includes personalized support from designated clinical staff supervised by the primary care provider, including individualized plan of care and coordination with other care providers 2. 24/7 contact phone numbers for assistance for urgent and routine care needs. 3. Service will only be billed when office clinical staff spend 20 minutes or more in a month to coordinate care. 4. Only one practitioner may furnish and bill the service in a calendar month. 5.The patient may stop CCM services at any time (effective at the end of the month) by phone call to the office staff. 6. The patient will be responsible for cost sharing (co-pay) of up to 20% of the service fee (after annual deductible is met). Patient agreed to services and consent obtained.  Patient Care Team: [name], FNP as PCP - General ([place]) Lorretta Harp, MD as PCP - Cardiology (Cardiology) Mayford Knife, [facility] (Pharmacist)  Recent office visits: 08/10/2020 PCP OV   Recent consult visits: 08/15/2020 - Pulmonary consult   Objective:  Lab Results  Component Value Date   CREATININE 3.37 (H) 09/05/2020   BUN 89 (HH) 09/05/2020   GFRNONAA 14 (L) 09/05/2020   GFRAA 16 (L) 09/05/2020   NA 139 09/05/2020   K 4.4 09/05/2020   CALCIUM 9.1 09/05/2020   CO2 21 09/05/2020     Lab Results  Component Value Date/Time   HGBA1C 6.0 (H) 08/10/2020 04:01 PM   HGBA1C 6.0 (H) 05/11/2020 12:32 PM   MICROALBUR 80 06/22/2019 12:02 PM    Last diabetic Eye exam:  Lab Results  Component Value Date/Time   HMDIABEYEEXA No Retinopathy 08/08/2020 12:00 AM    Last diabetic Foot exam: No results found for: HMDIABFOOTEX   Lab Results  Component Value Date   CHOL 168 08/10/2020   HDL 58 08/10/2020   LDLCALC 92 08/10/2020   TRIG 96 08/10/2020   CHOLHDL 2.9 08/10/2020    Hepatic Function Latest Ref Rng & Units 09/01/2020 08/31/2020 08/11/2020  Total Protein 6.5 - 8.1 g/dL 6.6 6.2(L) 7.1  Albumin 3.5 - 5.0 g/dL 3.4(L) 3.3(L) 3.7  AST 15 - 41 U/L 57(H) 62(H) 33  ALT 0 - 44 U/L 34 35 18  Alk Phosphatase 38 - 126 U/L 78 86 75  Total Bilirubin 0.3 - 1.2 mg/dL 1.4(H) 1.2 0.6  Bilirubin, Direct 0.0 - 0.2 mg/dL - - -    Lab Results  Component Value Date/Time   TSH 3.550 09/05/2020 12:53 PM   TSH 8.682 (H) 08/01/2020 11:14 AM   TSH 11.300 (H) 06/13/2020 02:32 PM   FREET4 1.11 06/08/2020 02:33 AM   FREET4 1.49 04/19/2019 04:39 PM    CBC Latest Ref Rng & Units 09/05/2020 09/02/2020 09/01/2020  WBC 3.4 - 10.8 x10E3/uL 5.9 5.9 -  Hemoglobin 11.1 - 15.9 g/dL 8.0(L) 7.7(L) 8.0(L)  Hematocrit 34.0 - 46.6 % 24.3(L) 22.3(L) 23.3(L)  Platelets 150 -  450 x10E3/uL 187 153 -    No results found for: VD25OH  Clinical ASCVD: Yes  The 10-year ASCVD risk score [name], et al., 2013) is: 15.7%   Values used to calculate the score:     Age: 10 years     Sex: Female     Is Non-Hispanic African American: Yes     Diabetic: Yes     Tobacco smoker: No     Systolic Blood Pressure: 500 mmHg     Is BP treated: Yes     HDL Cholesterol: 58 mg/dL     Total Cholesterol: 168 mg/dL    Depression screen [facility] 2/9 07/26/2020 07/13/2020 06/22/2019  Decreased Interest 0 0 0  Down, Depressed, Hopeless 0 0 0  PHQ - 2 Score 0 0 0  Altered sleeping 0 0 -  Tired, decreased energy 0 0 -  Change  in appetite 0 0 -  Feeling bad or failure about yourself  0 0 -  Trouble concentrating 0 0 -  Moving slowly or fidgety/restless 0 0 -  Suicidal thoughts 0 0 -  PHQ-9 Score 0 0 -  Difficult doing work/chores - - -  Some recent data might be hidden    CHA2DS2-VASc Score = 6  The patient's score is based upon: CHF History: Yes HTN History: Yes Diabetes History: Yes Stroke History: No Vascular Disease History: Yes Age Score: 1 Gender Score: 1  {   ASSESSMENT AND PLAN: Paroxysmal Atrial Fibrillation (ICD10:  I48.0) The patient's CHA2DS2-VASc score is 6, indicating a 9.7% annual risk of stroke.    The patient is at significant risk for stroke/thromboembolism based upon her CHA2DS2-VASc Score of 6.  However, the patient is not on anticoagulation due to her high bleeding risk.      [name], [name]    09/06/2020 3:11 PM     Social History   Tobacco Use  Smoking Status Former Smoker  . Packs/day: 0.25  . Years: 20.00  . Pack years: 5.00  . Types: Cigarettes  . Quit date: 01/31/2001  . Years since quitting: 19.6  Smokeless Tobacco Former User   BP Readings from Last 3 Encounters:  09/05/20 122/72  09/02/20 (!) 96/47  08/31/20 (!) 131/26   Pulse Readings from Last 3 Encounters:  09/05/20 60  09/02/20 92  08/31/20 (!) 58   Wt Readings from Last 3 Encounters:  09/05/20 204 lb 12.8 oz (92.9 kg)  09/02/20 206 lb 2.1 oz (93.5 kg)  08/30/20 206 lb (93.4 kg)    Assessment/Interventions: Review of patient past medical history, allergies, medications, health status, including review of consultants reports, laboratory and other test data, was performed as part of comprehensive evaluation and provision of chronic care management services.   SDOH:  (Social Determinants of Health) assessments and interventions performed: No   CCM Care Plan  Allergies  Allergen Reactions  . Ancef [Cefazolin] Itching    Severe itching- after procedure, ancef was the  antibiotic.-04/02/17 Tolerates penicillins  . Lactose Intolerance (Gi) Diarrhea  . Sulfa Antibiotics Rash and Other (See Comments)    Blisters, also    Medications Reviewed Today    Reviewed by [name], FNP (Family Nurse Practitioner) on 09/05/20 at 1242  Med List Status: <None>  Medication Order Taking? Sig Documenting Provider Last Dose Status Informant  allopurinol (ZYLOPRIM) 300 MG tablet [id number] Yes Take 300 mg by mouth daily. [provider] Taking Active Self  amiodarone (PACERONE) 200 MG tablet [id number]  Yes Take 1 tablet (200 mg total) by mouth daily. Bensimhon, Shaune Pascal, MD Taking Active Self  atorvastatin (LIPITOR) 40 MG tablet [id number] Yes Take 1 tablet by mouth once daily  Patient taking differently: Take 40 mg by mouth daily.   [name], FNP Taking Active   blood glucose meter kit and supplies [id number] Yes Dispense based on patient and insurance preference. Use up to four times daily as directed. (FOR ICD-10 E10.9, E11.9). [name], FNP Taking Active Self  Blood Glucose Monitoring Suppl [facility] Karsten Fells) w/Device Drucie Opitz [id number] Yes Check blood sugars twice daily E11.9 [name], FNP Taking Active Self  carvedilol (COREG) 6.25 MG tablet [id number] Yes Take 1 tablet (6.25 mg total) by mouth 2 (two) times daily. Darliss Cheney, MD Taking Active   cloNIDine (CATAPRES) 0.1 MG tablet [id number] Yes Take 1 tablet (0.1 mg total) by mouth 2 (two) times daily. Bensimhon, Shaune Pascal, MD Taking Active Self  fluticasone (FLONASE) 50 MCG/ACT nasal spray [id number] Yes Place 1 spray into both nostrils daily. Collene Gobble, MD Taking Active Self  furosemide (LASIX) 40 MG tablet [id number] Yes Take 1 tablet (40 mg total) by mouth daily. Take 80 mg in AM and 40 mg in PM (4PM) Debbe Odea, MD Taking Active Self  glucose blood test strip [id number] Yes check blood sugars twice daily Dx code E11.22 [name], FNP Taking Active Self  hydrALAZINE (APRESOLINE) 100 MG  tablet [id number] Yes TAKE 1 TABLET BY MOUTH THREE TIMES DAILY  Patient taking differently: Take 100 mg by mouth 3 (three) times daily.   Lorretta Harp, MD Taking Active   Insulin Glargine [facility]) 100 UNIT/ML [id number]  Inject 20 Units into the skin daily. [name], FNP  Active   Insulin Pen Needle Tyron Russell 5MM MISC [id number] Yes Use as directed Bonnielee Haff, MD Taking Active Self  Lancets (ONETOUCH DELICA PLUS [id number]) [place] [id number] Yes check blood sugars twice daily Dx code: E11.22 [name], FNP Taking Active Self  latanoprost (XALATAN) 0.005 % ophthalmic solution [id number] Yes Place 1 drop into both eyes at bedtime.  [provider] Taking Active Self  levothyroxine (SYNTHROID) 137 MCG tablet [id number] Yes Take 1 tablet (137 mcg total) by mouth daily before breakfast. Dan Europe, Ramandeep, NP Taking Active Self  linagliptin (TRADJENTA) 5 MG TABS tablet [id number] Yes Take 1 tablet (5 mg total) by mouth daily. [name], FNP Taking Active Self  loratadine (CLARITIN) 10 MG tablet [id number] Yes Take 10 mg by mouth daily. [provider] Taking Active Self  Multiple Vitamins-Minerals (CENTRUM SILVER 50+WOMEN) TABS [id number] Yes Take 1 tablet by mouth daily. [provider] Taking Active Self  omeprazole (PRILOSEC) 40 MG capsule [id number] Yes Take 1 capsule (40 mg total) by mouth 2 (two) times daily before a meal. Darliss Cheney, MD Taking Active   sildenafil (REVATIO) 20 MG tablet [id number] Yes TAKE 1 TABLET BY MOUTH THREE TIMES DAILY  Patient taking differently: Take 20 mg by mouth 3 (three) times daily.   Bensimhon, Shaune Pascal, MD Taking Active   sodium bicarbonate 650 MG tablet [id number] Yes Take 650 mg by mouth 2 (two) times daily. [provider] Taking Active Self  sodium chloride (OCEAN) 0.65 % nasal spray [id number] Yes Place 1 spray into the nose in the morning, at noon, in the evening, and at bedtime. [provider] Taking  Active Self          Patient Active Problem List   Diagnosis Date Noted  . Chronic  gastritis 09/02/2020  . Symptomatic anemia 08/31/2020  . Abnormal uterine bleeding (AUB) 07/19/2020  . Vaginal ulceration 07/19/2020  . Acute renal failure superimposed on stage 4 chronic kidney disease ([place])   . Palliative care by specialist   . Acute on chronic diastolic CHF (congestive heart failure) ([place]) 06/07/2020  . Chronic cough 05/04/2020  . Allergic rhinitis 03/23/2020  . Chronic kidney disease, stage 5 ([place]) 02/03/2020  . Acute diastolic CHF (congestive heart failure) ([place]) 09/21/2019  . HCAP (healthcare-associated pneumonia) 09/20/2019  . Pulmonary hypertension ([place]) 09/20/2019  . CKD (chronic kidney disease), stage III ([place]) 09/20/2019  . DM2 (diabetes mellitus, type 2) ([place]) 09/20/2019  . Elevated troponin 09/20/2019  . Severe sepsis ([place]) 09/20/2019  . CAP (community acquired pneumonia) 09/14/2019  . Hypoxia 09/14/2019  . Neutropenia ([place]) 05/25/2019  . Malignant neoplasm of colon ([place]) 05/25/2019  . Bradycardia 03/21/2019  . Pneumonia due to COVID-19 virus 03/18/2019  . Type 2 diabetes mellitus with hemoglobin A1c goal of less than 7.0% ([place]) 12/10/2018  . Hypothyroidism 12/10/2018  . Seasonal allergies 12/10/2018  . Gout 09/08/2018  . 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 Sacral wound 06/08/2018  . Iron deficiency anemia due to chronic blood loss 06/08/2018  . Hyponatremia 06/08/2018  . Constipation 06/08/2018  . Encounter for nasogastric (NG) tube placement   . Pressure injury of skin 05/22/2018  . PAF (paroxysmal atrial fibrillation) ([place]) 05/11/2018  . Normocytic anemia 06/14/2017  . Aortic stenosis, severe 06/10/2017  . Carotid artery disease ([place]) 06/10/2017  . Essential hypertension 05/13/2017  . Hyperlipidemia 05/13/2017  . Bilateral lower extremity edema 05/13/2017  . Port-A-Cath in place 04/28/2017  . MDS (myelodysplastic syndrome) ([place])  03/20/2017  . Goals of care, counseling/discussion 03/20/2017  . Anemia in chronic kidney disease 05/10/2016  . Anemia in stage 1 chronic kidney disease 05/10/2016  . Anemia due to chronic kidney disease 02/09/2016    Immunization History  Administered Date(s) Administered  . Fluad Quad(high Dose 65+) 04/28/2020  . Influenza,inj,Quad PF,6+ Mos 05/09/2017, 09/15/2018, 04/13/2019  . Influenza-Unspecified 04/05/2016  . PFIZER(Purple Top)SARS-COV-2 Vaccination 09/11/2019, 01/10/2020, 04/07/2020  . Pneumococcal Conjugate-13 05/04/2020    Conditions to be addressed/monitored:  Hypertension, Hyperlipidemia and Diabetes  Care Plan : [place]  Updates made by Mayford Knife, RPH since 09/06/2020 12:00 AM    Problem: HLD, HTN, DM, HF   Priority: High    Long-Range Goal: Disease Management   This Visit's Progress: On track  Priority: High  Note:   Current Barriers:  . Does not adhere to prescribed medication regimen   Pharmacist Clinical Goal(s):  Marland Kitchen Over the next 90 days, patient will achieve adherence to monitoring guidelines and medication adherence to achieve therapeutic efficacy through collaboration with PharmD and provider.    Interventions: . 1:1 collaboration with [name], FNP regarding development and update of comprehensive plan of care as evidenced by provider attestation and co-signature . Inter-disciplinary care team collaboration (see longitudinal plan of care) . Comprehensive medication review performed; medication list updated in electronic medical record  Hypertension (BP goal <130/80) -controlled -Current treatment: . Carvedilol 6.25 mg tablet two times per day  . Hydralazine 100 mg take 1 tablet three times per day . Clonidine 0.1 mg tablet twice per day  -Current home readings:  117/70, 115/65, 127/70 -Current dietary habits: Patient reports eating a low sodium diet  -Current exercise habits:will discuss at next office visit  -Denies  hypotensive/hypertensive symptoms -Educated on Daily salt intake goal <  2300 mg; Symptoms of hypotension and importance of maintaining adequate hydration; -Counseled to monitor BP at home at least 3 times per week, document, and provide log at future appointments -Recommended to continue current medication  Hyperlipidemia: (LDL goal <70 ) -uncontrolled -Current treatment: . Atorvastatin 40 mg - take daily.  -Current dietary patterns: patient reports not eating a lot of fried, fatty foods  -Current exercise habits: will discuss further during next office visit -Educated on Cholesterol goals;  Benefits of statin for ASCVD risk reduction; Importance of limiting foods high in cholesterol; -Recommended to continue current medication  Diabetes (A1c goal <7%) -controlled -Current medications: . Insulin Glargine 100 unit/ml- inject 18 units if blood sugar is over 150  . Tradjenta 5 mg - take 1 tablet daily  -Current home glucose readings -Denies hypoglycemic/hyperglycemic symptoms -Current meal patterns:  . breakfast: Eggs or a waffle, sometimes oatmel  . lunch: None  . Dinner: Her husband cooks but he is limiting the amount of salt in the food  . drinks: Water- she drinks three bottles of water or 2.5, sometimes a small soda -Current exercise: not discussed during this office visit  -Educated onA1c and blood sugar goals; Prevention and management of hypoglycemic episodes; -Counseled to check feet daily and get yearly eye exams -Recommended to continue current medication  Heart Failure (Goal: control symptoms and prevent exacerbations) controlled -Current treatment:  Furosemide 40 mg - take 2 tablets by mouth in the morning and one tablet at night   Clonidine 0.1 mg - taking 1 tablet by mouth two times daily  Amiodarone 200 mg tablet one   -Educated on Importance of weighing daily; if you gain more than 3 pounds in one day or 5 pounds in one week, patient taking the record of  weights and BP to her doctors -Recommended to continue current medication   Patient Goals/Self-Care Activities . Over the next 90 days, patient will:  - take medications as prescribed  Follow Up Plan: Telephone follow up appointment with care management team member scheduled for:  12/05/2020       Medication Assistance: Application for Tradjenta  medication assistance program. in process.  Anticipated assistance start date 09/12/2020.  See plan of care for additional detail.  Patient's preferred pharmacy is:  [facility], [address] [address] Phone: [phone] Fax: [phone]  Uses pill box? Yes Pt endorses 80% compliance  We discussed: Benefits of medication synchronization, packaging and delivery as well as enhanced pharmacist oversight with [employer]. Patient decided to: Continue current medication management strategy  Care Plan and Follow Up Patient Decision:  Patient agrees to Care Plan and Follow-up.  Plan: Telephone follow up appointment with care management team member scheduled for:  12/05/2020 and The patient has been provided with contact information for the care management team and has been advised to call with any health related questions or concerns.   Orlando Penner, PharmD Clinical Pharmacist [facility] [phone]

## 2020-08-27 ENCOUNTER — Other Ambulatory Visit: Payer: Self-pay | Admitting: Cardiovascular Disease

## 2020-08-30 ENCOUNTER — Ambulatory Visit (INDEPENDENT_AMBULATORY_CARE_PROVIDER_SITE_OTHER): Payer: Medicare Other | Admitting: Cardiovascular Disease

## 2020-08-30 ENCOUNTER — Other Ambulatory Visit: Payer: Self-pay

## 2020-08-30 ENCOUNTER — Encounter: Payer: Self-pay | Admitting: Cardiovascular Disease

## 2020-08-30 VITALS — BP 120/50 | HR 85 | Ht 64.0 in | Wt 206.0 lb

## 2020-08-30 DIAGNOSIS — I350 Nonrheumatic aortic (valve) stenosis: Secondary | ICD-10-CM | POA: Diagnosis not present

## 2020-08-30 NOTE — Patient Instructions (Signed)
Medication Instructions:  Your provider recommends that you continue on your current medications as directed. Please refer to the Current Medication list given to you today.   *If you need a refill on your cardiac medications before your next appointment, please call your pharmacy*  Follow-Up: We will be in touch for updates once Dr. Burt Knack talks to Dr. Haroldine Laws and your kidney doctor.   [facility]: Phone: [phone]

## 2020-08-30 NOTE — Progress Notes (Signed)
[facility]   MULTIDISCIPLINARY HEART VALVE TEAM  Date:  09/02/2020   ID:  Patricia Mata, DOB 1955/11/22, MRN [id number]  PCP:  [name], FNP   Chief Complaint  Patient presents with  . Shortness of Breath     HISTORY OF PRESENT ILLNESS: Patricia Mata is a 65 y.o. female who presents for evaluation of severe aortic stenosis, referred by Dr Haroldine Laws.  She is here with her husband today. She has known of a heart murmur for decades, but has known of having aortic stenosis for the past few years.  The patient has a complicated medical history dating back over a decade.  In 2008 she was diagnosed with stage III colon cancer and underwent hemicolectomy.  She ultimately developed myelodysplastic syndrome as a result of chemotherapy.  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  She tells me that she requires blood transfusions about every 3 weeks.  The patient has developed progressive, now stage V chronic kidney disease but is yet to undergo AV fistula placement.  She has been referred to vascular surgery and has agreed to hemodialysis when needed.  The patient developed community-acquired pneumonia and diastolic heart failure in March 2021 requiring intubation.  She was readmitted in November and December 2021 for acute on chronic heart failure in the setting of advanced kidney disease.  Remarkably, she has done much better with volume management over the past few months and is actually feeling pretty well at present.  She last saw Dr. Haroldine Laws in follow-up August 01, 2020 and is now referred for consideration of TAVR for treatment of severe aortic stenosis.  The patient reports exertional dyspnea with any moderate level activity.  She is able to do light things around her house without limitation.  She has had no recent chest pain, lightheadedness, or syncope.  She feels that much of her shortness of breath is related  to muscle weakness.  She denies orthopnea or PND.  The patient's most recent echocardiogram from 06/07/2020 shows an LVEF of 60 to 65% with moderate concentric LVH and grade 2 diastolic dysfunction.  RV function is normal.  The estimated right ventricular systolic pressure is 54 mmHg.  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  The aortic valve demonstrates severe aortic stenosis with a transaortic velocity in excess of 4 m/s, mean gradient 35 mmHg, peak gradient 66 mmHg, and calculated aortic valve area 0.71 cm.  Past Medical History:  Diagnosis Date  . Acute renal failure (ARF) ([place]) 06/08/2018  . Cancer [facility]) 2008   Colon   . Chronic diastolic (congestive) heart failure ([place])   . Diabetes mellitus without complication ([place])   . Gout 09/08/2018  . Hypertension   . Macrocytic anemia   . MDS (myelodysplastic syndrome) ([place])   . Stage 4 chronic kidney disease ([facility])     No current facility-administered medications for this visit.   No current outpatient medications on file.   Facility-Administered Medications Ordered in Other Visits  Medication Dose Route Frequency Provider Last Rate Last Admin  . 0.9 %  sodium chloride infusion (Manually program via Guardrails IV Fluids)  250 mL Intravenous Once Clarene Essex, MD      . acetaminophen (TYLENOL) tablet 650 mg  650 mg Oral Q6H PRN Clarene Essex, MD       Or  . acetaminophen (TYLENOL) suppository 650  mg  650 mg Rectal Q6H PRN Clarene Essex, MD      . acetaminophen (TYLENOL) tablet 650 mg  650 mg Oral Once Clarene Essex, MD      . allopurinol (ZYLOPRIM) tablet 300 mg  300 mg Oral Daily Clarene Essex, MD   300 mg at 09/01/20 0836  . amiodarone (PACERONE) tablet 200 mg  200 mg Oral Daily Clarene Essex, MD   200 mg at 09/01/20 0836  . atorvastatin (LIPITOR) tablet 40 mg  40 mg Oral Daily Clarene Essex, MD   40 mg at 09/01/20 0836  . carvedilol (COREG) tablet  12.5 mg  12.5 mg Oral BID Clarene Essex, MD   12.5 mg at 08/31/20 2120  . cloNIDine (CATAPRES) tablet 0.1 mg  0.1 mg Oral BID Clarene Essex, MD   0.1 mg at 09/01/20 0837  . diphenhydrAMINE (BENADRYL) capsule 25 mg  25 mg Oral Once Clarene Essex, MD      . furosemide (LASIX) injection 20 mg  20 mg Intravenous Once Clarene Essex, MD      . furosemide (LASIX) tablet 40 mg  40 mg Oral Daily Clarene Essex, MD   40 mg at 09/01/20 1610  . hydrALAZINE (APRESOLINE) injection 10 mg  10 mg Intravenous Q6H PRN Darliss Cheney, MD      . hydrALAZINE (APRESOLINE) tablet 100 mg  100 mg Oral TID Darliss Cheney, MD   100 mg at 09/01/20 2220  . insulin aspart (novoLOG) injection 0-15 Units  0-15 Units Subcutaneous TID WC Clarene Essex, MD   2 Units at 09/01/20 1823  . latanoprost (XALATAN) 0.005 % ophthalmic solution 1 drop  1 drop Both Eyes QHS Clarene Essex, MD   1 drop at 09/01/20 2220  . levothyroxine (SYNTHROID) tablet 137 mcg  137 mcg Oral QAC breakfast Clarene Essex, MD   137 mcg at 09/02/20 0549  . linagliptin (TRADJENTA) tablet 5 mg  5 mg Oral Daily Clarene Essex, MD   5 mg at 09/01/20 0835  . loratadine (CLARITIN) tablet 10 mg  10 mg Oral Daily Clarene Essex, MD   10 mg at 09/01/20 0837  . ondansetron (ZOFRAN) tablet 4 mg  4 mg Oral Q6H PRN Clarene Essex, MD       Or  . ondansetron [facility]) injection 4 mg  4 mg Intravenous Q6H PRN Clarene Essex, MD      . pantoprazole (PROTONIX) injection 40 mg  40 mg Intravenous BID Clarene Essex, MD   40 mg at 09/01/20 2220  . sildenafil (REVATIO) tablet 20 mg  20 mg Oral TID Clarene Essex, MD   20 mg at 09/01/20 1818  . sodium bicarbonate tablet 650 mg  650 mg Oral BID Clarene Essex, MD   650 mg at 09/01/20 2220  . sodium chloride (OCEAN) 0.65 % nasal spray 1 spray  1 spray Nasal TID Clarene Essex, MD   1 spray at 09/01/20 2221    ALLERGIES:   Ancef [cefazolin], Lactose intolerance (gi), and Sulfa antibiotics   SOCIAL HISTORY:  The patient  reports that she quit smoking about 19 years ago. Her  smoking use included cigarettes. She has a 5.00 pack-year smoking history. She has quit using smokeless tobacco. She reports current alcohol use of about 2.0 standard drinks of alcohol per week. She reports that she does not use drugs.   FAMILY HISTORY:  The patient's family history includes Cervical cancer in her mother; Diabetes in her mother; HIV/AIDS in her brother; Heart attack in her father; Hypertension in  her brother, mother, sister, sister, and sister; Prostate cancer in her brother.   REVIEW OF SYSTEMS:  Positive for weakness.   All other systems are reviewed and negative.   PHYSICAL EXAM: VS:  BP (!) 120/50   Pulse 85   Ht 5\' 4"  (1.626 m)   Wt 206 lb (93.4 kg)   SpO2 98%   BMI 35.36 kg/m  , BMI Body mass index is 35.36 kg/m. GEN: Well nourished, well developed, in no acute distress HEENT: normal Neck: No JVD. carotids 2+ without bruits or masses Cardiac: The heart is RRR with 3/6 harsh crescendo decrescendo murmur at the RUSB with diminished A2. No edema. Pedal pulses 2+ = bilaterally  Respiratory:  clear to auscultation bilaterally GI: soft, nontender, nondistended, + BS MS: no deformity or atrophy Skin: warm and dry, no rash Neuro:  Strength and sensation are intact Psych: euthymic mood, full affect  EKG:  EKG from 08/01/2020 reviewed and demonstrates sinus bradycardia 52 bpm, otherwise normal  RECENT LABS: 07/06/2020: B Natriuretic Peptide 932.6 08/01/2020: TSH 8.682 09/01/2020: ALT 34 09/02/2020: BUN 108; Creatinine, Ser 4.20; Hemoglobin 7.7; Magnesium 1.9; Platelets 153; Potassium 3.6; Sodium 133  08/10/2020: Chol/HDL Ratio 2.9; Cholesterol, Total 168; HDL 58; LDL Chol Calc (NIH) 92; Triglycerides 96   Estimated Creatinine Clearance: 14.8 mL/min (A) (by C-G formula based on SCr of 4.2 mg/dL (H)).   Wt Readings from Last 3 Encounters:  09/02/20 206 lb 2.1 oz (93.5 kg)  08/30/20 206 lb (93.4 kg)  08/15/20 206 lb 12.8 oz (93.8 kg)     CARDIAC STUDIES:  Echo:   FINDINGS  Left Ventricle: Left ventricular ejection fraction, by estimation, is 60  to 65%. The left ventricle has normal function. The left ventricle has no  regional wall motion abnormalities. The left ventricular internal cavity  size was normal in size. There is  moderate concentric left ventricular hypertrophy. Left ventricular  diastolic parameters are consistent with Grade II diastolic dysfunction  (pseudonormalization). Elevated left ventricular end-diastolic pressure.   Right Ventricle: The right ventricular size is normal. Right vetricular  wall thickness was not well visualized. Right ventricular systolic  function is normal. There is moderately elevated pulmonary artery systolic  pressure. The tricuspid regurgitant  velocity is 3.14 m/s, and with an assumed right atrial pressure of 15  mmHg, the estimated right ventricular systolic pressure is 09.2 mmHg.   Left Atrium: Left atrial size was moderately dilated.   Right Atrium: Right atrial size was severely dilated.   Pericardium: Trivial pericardial effusion is present.   Mitral Valve: Mean gradient is 10 mmHg at 64 bpm, but valve area is 2.50  cm2 by PHT. The mitral valve is rheumatic. There is moderate thickening of  the mitral valve leaflet(s). There is moderate calcification of the mitral  valve leaflet(s). Severe mitral  annular calcification. Mild mitral valve regurgitation. Moderate mitral  valve stenosis. MV peak gradient, 32.5 mmHg. The mean mitral valve  gradient is 10.0 mmHg.   Tricuspid Valve: The tricuspid valve is normal in structure. Tricuspid  valve regurgitation is mild to moderate. No evidence of tricuspid  stenosis.   Aortic Valve: The aortic valve is calcified. Aortic valve regurgitation is  trivial. Aortic regurgitation PHT measures 448 msec. Severe aortic  stenosis is present. Aortic valve mean gradient measures 35.0 mmHg. Aortic  valve peak gradient measures 66.3  mmHg. Aortic valve area,  by VTI measures 0.84 cm.   Pulmonic Valve: The pulmonic valve was not well visualized. Pulmonic valve  regurgitation is trivial. No evidence of pulmonic stenosis.   Aorta: The aortic root and ascending aorta are structurally normal, with  no evidence of dilitation.   Venous: The inferior vena cava is dilated in size with less than 50%  respiratory variability, suggesting right atrial pressure of 15 mmHg.   IAS/Shunts: The atrial septum is grossly normal.     LEFT VENTRICLE  PLAX 2D  LVIDd:     4.40 cm Diastology  LVIDs:     2.80 cm LV e' medial:  6.22 cm/s  LV PW:     1.40 cm LV E/e' medial: 30.7  LV IVS:    1.30 cm LV e' lateral:  8.59 cm/s  LVOT diam:   2.00 cm LV E/e' lateral: 22.2  LV SV:     89  LV SV Index:  43  LVOT Area:   3.14 cm     RIGHT VENTRICLE  TAPSE (M-mode): 1.9 cm   LEFT ATRIUM       Index    RIGHT ATRIUM      Index  LA diam:    5.00 cm 2.42 cm/m RA Area:   29.70 cm  LA Vol (A2C):  83.6 ml 40.54 ml/m RA Volume:  100.00 ml 48.49 ml/m  LA Vol (A4C):  72.3 ml 35.06 ml/m  LA Biplane Vol: 79.0 ml 38.31 ml/m  AORTIC VALVE  AV Area (Vmax):  0.71 cm  AV Area (Vmean):  0.71 cm  AV Area (VTI):   0.84 cm  AV Vmax:      407.00 cm/s  AV Vmean:     280.000 cm/s  AV VTI:      1.050 m  AV Peak Grad:   66.3 mmHg  AV Mean Grad:   35.0 mmHg  LVOT Vmax:     91.40 cm/s  LVOT Vmean:    62.900 cm/s  LVOT VTI:     0.282 m  LVOT/AV VTI ratio: 0.27  AI PHT:      448 msec    AORTA  Ao Root diam: 3.20 cm   MITRAL VALVE        TRICUSPID VALVE  MV Area (PHT): 2.76 cm   TR Peak grad:  39.4 mmHg  MV Peak grad: 32.5 mmHg  TR Vmax:    314.00 cm/s  MV Mean grad: 10.0 mmHg  MV Vmax:    2.85 m/s   SHUNTS  MV Vmean:   140.0 cm/s  Systemic VTI: 0.28 m  MV Decel Time: 275 msec   Systemic Diam: 2.00 cm  MV E velocity: 191.00 cm/s   MV A velocity: 107.00 cm/s  MV E/A ratio: 1.79   STS RISK CALCULATOR: Isolated AVR: Risk of Mortality: 3.990% Renal Failure: NA Permanent Stroke: 2.198% Prolonged Ventilation: 27.407% DSW Infection: 0.243% Reoperation: 4.495% Morbidity or Mortality: 71.252% Short Length of Stay: 11.127% Long Length of Stay: 24.372%  ASSESSMENT AND PLAN: 1.  Severe, Stage D1 aortic stenosis, NYHA functional class IIIb. I have reviewed the natural history of aortic stenosis with the patient and their family members who are present today. We have discussed the limitations of medical therapy and the poor prognosis associated with symptomatic aortic stenosis. We have reviewed potential treatment options, including palliative medical therapy, conventional surgical aortic valve replacement, and transcatheter aortic valve replacement. We discussed treatment options in the context of the patient's specific comorbid medical conditions.  As outlined above, the patient exhibits major comorbidities including stage V kidney disease, paroxysmal atrial fibrillation, pulmonary hypertension, and transfusion  dependent anemia in the setting of myelodysplastic syndrome.  In spite of her medical problems, her functional capacity has improved over recent months.  She is functionally independent than would like to maintain the best possible quality of life as she can.  She understands that she is approaching the need for hemodialysis and will follow through with vascular surgery consultation for placement of an AV fistula.  I have reviewed the patient's echo images with findings outlined above.  Her echo clearly demonstrates findings consistent with severe aortic stenosis with severe calcification and restriction of the aortic valve leaflets, peak transaortic velocity greater than 4 m/s, peak and mean gradients of 66 and 35 mmHg, and calculated valve area of 0.71 cm.  The patient understands that proceeding with TAVR would  require further evaluation that would involve iodinated contrast.  She would require right and left heart catheterization to evaluate her hemodynamics and exclude the presence of obstructive coronary artery disease.  She would require CT angiography of the heart as well as the chest, abdomen, and pelvis to assess anatomic feasibility of TAVR.  These studies would likely precipitate the need for hemodialysis.  I explained that we try to avoid temporary dialysis catheters in patients undergoing TAVR because of risk of bacteremia and seeding of the valve prosthesis leading to prosthetic valve endocarditis.  I have encouraged her to undergo vascular surgery evaluation in the near future so that an AV fistula can be placed.  Will move forward with further work-up pending this evaluation.  If she develops progressive symptoms of heart failure, we may need to move forward more urgently.  Once her right and left heart catheterization and CTA studies are completed, she will be referred for formal cardiac surgical consultation as part of a multidisciplinary review of her case.  We discussed more conservative care such as a palliative approach in the background of her multiple comorbidities, but the patient strongly favors moving forward with treatment of her aortic stenosis as she plans to take steps towards hemodialysis.  I do not think she will tolerate hemodialysis over the long-term without treatment of her aortic stenosis.  As part of her evaluation today, I discussed her case with her heart failure/pulmonary HTN specialist, Dr. Haroldine Laws and also with nephrologist, Dr. Carolin Sicks.  Deatra James 09/02/2020 7:27 AM     [facility] [address] [place] [address]  [phone] (office) [phone] (fax)

## 2020-08-31 ENCOUNTER — Inpatient Hospital Stay: Payer: Medicare Other

## 2020-08-31 ENCOUNTER — Inpatient Hospital Stay: Payer: Medicare Other | Attending: Oncology

## 2020-08-31 ENCOUNTER — Encounter (HOSPITAL_COMMUNITY): Payer: Self-pay

## 2020-08-31 ENCOUNTER — Other Ambulatory Visit: Payer: Self-pay | Admitting: *Deleted

## 2020-08-31 ENCOUNTER — Telehealth: Payer: Self-pay

## 2020-08-31 ENCOUNTER — Inpatient Hospital Stay (HOSPITAL_COMMUNITY)
Admission: EM | Admit: 2020-08-31 | Discharge: 2020-09-02 | DRG: 812 | Disposition: A | Discharge status: Home / Self Care | Payer: Medicare Other | Source: Ambulatory Visit | Attending: Family Medicine | Admitting: Family Medicine

## 2020-08-31 ENCOUNTER — Other Ambulatory Visit: Payer: Self-pay

## 2020-08-31 ENCOUNTER — Inpatient Hospital Stay (HOSPITAL_COMMUNITY): Payer: Medicare Other

## 2020-08-31 ENCOUNTER — Telehealth: Payer: Self-pay | Admitting: *Deleted

## 2020-08-31 DIAGNOSIS — D696 Thrombocytopenia, unspecified: Secondary | ICD-10-CM | POA: Diagnosis present

## 2020-08-31 DIAGNOSIS — D469 Myelodysplastic syndrome, unspecified: Secondary | ICD-10-CM

## 2020-08-31 DIAGNOSIS — Z8249 Family history of ischemic heart disease and other diseases of the circulatory system: Secondary | ICD-10-CM

## 2020-08-31 DIAGNOSIS — Z85038 Personal history of other malignant neoplasm of large intestine: Secondary | ICD-10-CM

## 2020-08-31 DIAGNOSIS — I4891 Unspecified atrial fibrillation: Secondary | ICD-10-CM | POA: Diagnosis present

## 2020-08-31 DIAGNOSIS — D500 Iron deficiency anemia secondary to blood loss (chronic): Secondary | ICD-10-CM | POA: Diagnosis present

## 2020-08-31 DIAGNOSIS — D649 Anemia, unspecified: Secondary | ICD-10-CM | POA: Diagnosis present

## 2020-08-31 DIAGNOSIS — Z79890 Hormone replacement therapy (postmenopausal): Secondary | ICD-10-CM | POA: Diagnosis not present

## 2020-08-31 DIAGNOSIS — E739 Lactose intolerance, unspecified: Secondary | ICD-10-CM | POA: Diagnosis present

## 2020-08-31 DIAGNOSIS — Z79899 Other long term (current) drug therapy: Secondary | ICD-10-CM

## 2020-08-31 DIAGNOSIS — Z87891 Personal history of nicotine dependence: Secondary | ICD-10-CM

## 2020-08-31 DIAGNOSIS — K921 Melena: Secondary | ICD-10-CM | POA: Diagnosis present

## 2020-08-31 DIAGNOSIS — E039 Hypothyroidism, unspecified: Secondary | ICD-10-CM | POA: Diagnosis present

## 2020-08-31 DIAGNOSIS — Z8616 Personal history of COVID-19: Secondary | ICD-10-CM | POA: Diagnosis not present

## 2020-08-31 DIAGNOSIS — I5032 Chronic diastolic (congestive) heart failure: Secondary | ICD-10-CM | POA: Diagnosis present

## 2020-08-31 DIAGNOSIS — Z8049 Family history of malignant neoplasm of other genital organs: Secondary | ICD-10-CM

## 2020-08-31 DIAGNOSIS — Z833 Family history of diabetes mellitus: Secondary | ICD-10-CM | POA: Diagnosis not present

## 2020-08-31 DIAGNOSIS — Z794 Long term (current) use of insulin: Secondary | ICD-10-CM

## 2020-08-31 DIAGNOSIS — Z9049 Acquired absence of other specified parts of digestive tract: Secondary | ICD-10-CM

## 2020-08-31 DIAGNOSIS — E1122 Type 2 diabetes mellitus with diabetic chronic kidney disease: Secondary | ICD-10-CM | POA: Diagnosis present

## 2020-08-31 DIAGNOSIS — D630 Anemia in neoplastic disease: Secondary | ICD-10-CM | POA: Diagnosis present

## 2020-08-31 DIAGNOSIS — Z888 Allergy status to other drugs, medicaments and biological substances status: Secondary | ICD-10-CM

## 2020-08-31 DIAGNOSIS — Z7984 Long term (current) use of oral hypoglycemic drugs: Secondary | ICD-10-CM | POA: Diagnosis not present

## 2020-08-31 DIAGNOSIS — I080 Rheumatic disorders of both mitral and aortic valves: Secondary | ICD-10-CM | POA: Diagnosis present

## 2020-08-31 DIAGNOSIS — Z20822 Contact with and (suspected) exposure to covid-19: Secondary | ICD-10-CM | POA: Diagnosis present

## 2020-08-31 DIAGNOSIS — E785 Hyperlipidemia, unspecified: Secondary | ICD-10-CM | POA: Diagnosis present

## 2020-08-31 DIAGNOSIS — I132 Hypertensive heart and chronic kidney disease with heart failure and with stage 5 chronic kidney disease, or end stage renal disease: Secondary | ICD-10-CM | POA: Diagnosis present

## 2020-08-31 DIAGNOSIS — R040 Epistaxis: Secondary | ICD-10-CM | POA: Diagnosis present

## 2020-08-31 DIAGNOSIS — K2950 Unspecified chronic gastritis without bleeding: Secondary | ICD-10-CM | POA: Diagnosis present

## 2020-08-31 DIAGNOSIS — D631 Anemia in chronic kidney disease: Secondary | ICD-10-CM | POA: Diagnosis present

## 2020-08-31 DIAGNOSIS — Z882 Allergy status to sulfonamides status: Secondary | ICD-10-CM | POA: Diagnosis not present

## 2020-08-31 LAB — CMP (CANCER CENTER ONLY)
ALT: 35 U/L (ref 0–44)
AST: 62 U/L — ABNORMAL HIGH (ref 15–41)
Albumin: 3.3 g/dL — ABNORMAL LOW (ref 3.5–5.0)
Alkaline Phosphatase: 86 U/L (ref 38–126)
Anion gap: 13 (ref 5–15)
BUN: 117 mg/dL — ABNORMAL HIGH (ref 8–23)
CO2: 19 mmol/L — ABNORMAL LOW (ref 22–32)
Calcium: 9 mg/dL (ref 8.9–10.3)
Chloride: 100 mmol/L (ref 98–111)
Creatinine: 5.01 mg/dL (ref 0.44–1.00)
GFR, Estimated: 9 mL/min — ABNORMAL LOW (ref >60)
Glucose, Bld: 159 mg/dL — ABNORMAL HIGH (ref 70–99)
Potassium: 4.8 mmol/L (ref 3.5–5.1)
Sodium: 132 mmol/L — ABNORMAL LOW (ref 135–145)
Total Bilirubin: 1.2 mg/dL (ref 0.3–1.2)
Total Protein: 6.2 g/dL — ABNORMAL LOW (ref 6.5–8.1)

## 2020-08-31 LAB — FERRITIN: Ferritin: 2236 ng/mL — ABNORMAL HIGH (ref 11–307)

## 2020-08-31 LAB — DIRECT ANTIGLOBULIN TEST (NOT AT ARMC)
DAT, IgG: NEGATIVE
DAT, complement: NEGATIVE

## 2020-08-31 LAB — POC OCCULT BLOOD, ED
Fecal Occult Bld: NEGATIVE
Fecal Occult Bld: POSITIVE — AB

## 2020-08-31 LAB — RETICULOCYTES
Immature Retic Fract: 4.7 % (ref 2.3–15.9)
RBC.: 1.94 MIL/uL — ABNORMAL LOW (ref 3.87–5.11)
Retic Count, Absolute: 57.8 10*3/uL (ref 19.0–186.0)
Retic Ct Pct: 3 % (ref 0.4–3.1)

## 2020-08-31 LAB — CBC WITH DIFFERENTIAL (CANCER CENTER ONLY)
Abs Immature Granulocytes: 0.02 10*3/uL (ref 0.00–0.07)
Basophils Absolute: 0 10*3/uL (ref 0.0–0.1)
Basophils Relative: 0 %
Eosinophils Absolute: 0 10*3/uL (ref 0.0–0.5)
Eosinophils Relative: 0 %
HCT: 12.8 % — ABNORMAL LOW (ref 36.0–46.0)
Hemoglobin: 4.1 g/dL — CL (ref 12.0–15.0)
Immature Granulocytes: 0 %
Lymphocytes Relative: 7 %
Lymphs Abs: 0.4 10*3/uL — ABNORMAL LOW (ref 0.7–4.0)
MCH: 30.1 pg (ref 26.0–34.0)
MCHC: 32 g/dL (ref 30.0–36.0)
MCV: 94.1 fL (ref 80.0–100.0)
Monocytes Absolute: 1.1 10*3/uL — ABNORMAL HIGH (ref 0.1–1.0)
Monocytes Relative: 19 %
Neutro Abs: 4 10*3/uL (ref 1.7–7.7)
Neutrophils Relative %: 74 %
Platelet Count: 125 10*3/uL — ABNORMAL LOW (ref 150–400)
RBC: 1.36 MIL/uL — ABNORMAL LOW (ref 3.87–5.11)
RDW: 21.9 % — ABNORMAL HIGH (ref 11.5–15.5)
WBC Count: 5.4 10*3/uL (ref 4.0–10.5)
nRBC: 4.2 % — ABNORMAL HIGH (ref 0.0–0.2)

## 2020-08-31 LAB — GLUCOSE, CAPILLARY: Glucose-Capillary: 244 mg/dL — ABNORMAL HIGH (ref 70–99)

## 2020-08-31 LAB — IRON AND TIBC
Iron: 248 ug/dL — ABNORMAL HIGH (ref 28–170)
Saturation Ratios: 93 % — ABNORMAL HIGH (ref 10.4–31.8)
TIBC: 266 ug/dL (ref 250–450)
UIBC: 18 ug/dL

## 2020-08-31 LAB — LACTATE DEHYDROGENASE: LDH: 198 U/L — ABNORMAL HIGH (ref 98–192)

## 2020-08-31 LAB — VITAMIN B12: Vitamin B-12: 1334 pg/mL — ABNORMAL HIGH (ref 180–914)

## 2020-08-31 LAB — SAMPLE TO BLOOD BANK

## 2020-08-31 LAB — PREPARE RBC (CROSSMATCH)

## 2020-08-31 LAB — FOLATE: Folate: 37 ng/mL (ref >5.9)

## 2020-08-31 MED ORDER — INSULIN ASPART 100 UNIT/ML ~~LOC~~ SOLN
0.0000 [IU] | Freq: Three times a day (TID) | SUBCUTANEOUS | Status: DC
  Administered 2020-09-01: 3 [IU] via SUBCUTANEOUS
  Administered 2020-09-01 – 2020-09-02 (×3): 2 [IU] via SUBCUTANEOUS
  Filled 2020-08-31: qty 0.15

## 2020-08-31 MED ORDER — ONDANSETRON HCL 4 MG PO TABS: 4.0000 mg | ORAL_TABLET | Freq: Four times a day (QID) | ORAL | Status: DC | PRN

## 2020-08-31 MED ORDER — LINAGLIPTIN 5 MG PO TABS
5.0000 mg | ORAL_TABLET | Freq: Every day | ORAL | Status: DC
  Administered 2020-09-01 – 2020-09-02 (×2): 5 mg via ORAL
  Filled 2020-08-31 (×2): qty 1

## 2020-08-31 MED ORDER — SODIUM CHLORIDE 0.9% IV SOLUTION: 250.0000 mL | Freq: Once | INTRAVENOUS | Status: DC

## 2020-08-31 MED ORDER — PANTOPRAZOLE SODIUM 40 MG IV SOLR
40.0000 mg | Freq: Two times a day (BID) | INTRAVENOUS | Status: DC
  Administered 2020-08-31 – 2020-09-02 (×4): 40 mg via INTRAVENOUS
  Filled 2020-08-31 (×4): qty 40

## 2020-08-31 MED ORDER — FUROSEMIDE 40 MG PO TABS
40.0000 mg | ORAL_TABLET | Freq: Every day | ORAL | Status: DC
  Administered 2020-08-31 – 2020-09-02 (×3): 40 mg via ORAL
  Filled 2020-08-31 (×3): qty 1

## 2020-08-31 MED ORDER — AMIODARONE HCL 200 MG PO TABS
200.0000 mg | ORAL_TABLET | Freq: Every day | ORAL | Status: DC
Start: 2020-09-01 — End: 2020-09-02
  Administered 2020-09-01 – 2020-09-02 (×2): 200 mg via ORAL
  Filled 2020-08-31 (×2): qty 1

## 2020-08-31 MED ORDER — ACETAMINOPHEN 325 MG PO TABS: 650.0000 mg | ORAL_TABLET | Freq: Once | ORAL | Status: DC

## 2020-08-31 MED ORDER — LEVOTHYROXINE SODIUM 25 MCG PO TABS
137.0000 ug | ORAL_TABLET | Freq: Every day | ORAL | Status: DC
  Administered 2020-09-01 – 2020-09-02 (×2): 137 ug via ORAL
  Filled 2020-08-31 (×2): qty 1

## 2020-08-31 MED ORDER — SODIUM BICARBONATE 650 MG PO TABS
650.0000 mg | ORAL_TABLET | Freq: Two times a day (BID) | ORAL | Status: DC
  Administered 2020-09-01 – 2020-09-02 (×4): 650 mg via ORAL
  Filled 2020-08-31 (×5): qty 1

## 2020-08-31 MED ORDER — ATORVASTATIN CALCIUM 40 MG PO TABS
40.0000 mg | ORAL_TABLET | Freq: Every day | ORAL | Status: DC
  Administered 2020-09-01 – 2020-09-02 (×2): 40 mg via ORAL
  Filled 2020-08-31 (×2): qty 1

## 2020-08-31 MED ORDER — DIPHENHYDRAMINE HCL 25 MG PO CAPS: 25.0000 mg | ORAL_CAPSULE | Freq: Once | ORAL | Status: DC

## 2020-08-31 MED ORDER — ACETAMINOPHEN 325 MG PO TABS: 650.0000 mg | ORAL_TABLET | Freq: Four times a day (QID) | ORAL | Status: DC | PRN

## 2020-08-31 MED ORDER — SODIUM CHLORIDE 0.9 % IV SOLN
10.0000 mL/h | Freq: Once | INTRAVENOUS | Status: AC
  Administered 2020-08-31: 10 mL/h via INTRAVENOUS

## 2020-08-31 MED ORDER — CLONIDINE HCL 0.1 MG PO TABS
0.1000 mg | ORAL_TABLET | Freq: Two times a day (BID) | ORAL | Status: DC
  Administered 2020-08-31 – 2020-09-02 (×3): 0.1 mg via ORAL
  Filled 2020-08-31 (×4): qty 1

## 2020-08-31 MED ORDER — SILDENAFIL CITRATE 20 MG PO TABS
20.0000 mg | ORAL_TABLET | Freq: Three times a day (TID) | ORAL | Status: DC
  Administered 2020-09-01 – 2020-09-02 (×4): 20 mg via ORAL
  Filled 2020-08-31 (×7): qty 1

## 2020-08-31 MED ORDER — CARVEDILOL 12.5 MG PO TABS
12.5000 mg | ORAL_TABLET | Freq: Two times a day (BID) | ORAL | Status: DC
  Administered 2020-08-31: 12.5 mg via ORAL
  Filled 2020-08-31 (×3): qty 1

## 2020-08-31 MED ORDER — ONDANSETRON HCL 4 MG/2ML IJ SOLN: 4.0000 mg | Freq: Four times a day (QID) | INTRAMUSCULAR | Status: DC | PRN

## 2020-08-31 MED ORDER — ACETAMINOPHEN 650 MG RE SUPP: 650.0000 mg | Freq: Four times a day (QID) | RECTAL | Status: DC | PRN

## 2020-08-31 MED ORDER — SALINE SPRAY 0.65 % NA SOLN
1.0000 | Freq: Three times a day (TID) | NASAL | Status: DC
  Administered 2020-09-01 – 2020-09-02 (×5): 1 via NASAL
  Filled 2020-08-31 (×2): qty 44

## 2020-08-31 MED ORDER — FUROSEMIDE 10 MG/ML IJ SOLN: 20.0000 mg | Freq: Once | INTRAMUSCULAR | Status: DC

## 2020-08-31 MED ORDER — LORATADINE 10 MG PO TABS
10.0000 mg | ORAL_TABLET | Freq: Every day | ORAL | Status: DC
  Administered 2020-08-31 – 2020-09-02 (×3): 10 mg via ORAL
  Filled 2020-08-31 (×3): qty 1

## 2020-08-31 MED ORDER — ALLOPURINOL 300 MG PO TABS
300.0000 mg | ORAL_TABLET | Freq: Every day | ORAL | Status: DC
  Administered 2020-09-01 – 2020-09-02 (×2): 300 mg via ORAL
  Filled 2020-08-31 (×2): qty 1

## 2020-08-31 MED ORDER — LATANOPROST 0.005 % OP SOLN
1.0000 [drp] | Freq: Every day | OPHTHALMIC | Status: DC
  Administered 2020-09-01 (×2): 1 [drp] via OPHTHALMIC
  Filled 2020-08-31 (×2): qty 2.5

## 2020-08-31 NOTE — ED Provider Notes (Signed)
[facility] DEPT Provider Note   CSN: [id number] Arrival date & time: 08/31/20  1209     History Chief Complaint  Patient presents with  . Anemia    Ethelean Colla is a 65 y.o. female.  HPI      65yo female with history of DM, CHF, CKD, htn, colon cancer 2008, myelodysplastic syndrome, symptomatic anemia for which she has been transfusion dependent with hemoglobin 6.6 1/28 and received 2U pRBC who presents from cancer center with concern for symptomatic anemia and hemoglobin of 4.4.  Reports progressive fatigue, lightheadedness and dyspnea on exertion over the last 2 weeks.  No chest pain. No fever, cough.  No numbness/weakness. No abdominal pain. Did have dark-black stool for last 2 weeks but for last 2 days has appeared more brown in color.   Past Medical History:  Diagnosis Date  . Acute renal failure (ARF) ([place]) 06/08/2018  . Cancer [facility]) 2008   Colon   . Chronic diastolic (congestive) heart failure ([place])   . Diabetes mellitus without complication ([place])   . Gout 09/08/2018  . Hypertension   . Macrocytic anemia   . MDS (myelodysplastic syndrome) ([place])   . Stage 4 chronic kidney disease [facility])     Patient Active Problem List   Diagnosis Date Noted  . Symptomatic anemia 08/31/2020  . Abnormal uterine bleeding (AUB) 07/19/2020  . Vaginal ulceration 07/19/2020  . Acute renal failure superimposed on stage 4 chronic kidney disease ([place])   . Palliative care by specialist   . Acute on chronic diastolic CHF (congestive heart failure) ([place]) 06/07/2020  . Chronic cough 05/04/2020  . Allergic rhinitis 03/23/2020  . Chronic kidney disease, stage 5 ([place]) 02/03/2020  . Purulent bronchitis ([place]) 01/28/2020  . Acute diastolic CHF (congestive heart failure) ([place]) 09/21/2019  . ARDS (adult respiratory distress syndrome) ([place])   . Acute respiratory failure with hypoxia ([place]) 09/20/2019  . HCAP (healthcare-associated pneumonia) 09/20/2019  .  Pulmonary hypertension ([place]) 09/20/2019  . CKD (chronic kidney disease), stage III ([place]) 09/20/2019  . DM2 (diabetes mellitus, type 2) ([place]) 09/20/2019  . Elevated troponin 09/20/2019  . Severe sepsis ([place]) 09/20/2019  . CAP (community acquired pneumonia) 09/14/2019  . Hypoxia 09/14/2019  . Neutropenia ([place]) 05/25/2019  . Malignant neoplasm of colon ([place]) 05/25/2019  . Bradycardia 03/21/2019  . Pneumonia due to COVID-19 virus 03/18/2019  . Type 2 diabetes mellitus with hemoglobin A1c goal of less than 7.0% ([place]) 12/10/2018  . Hypothyroidism 12/10/2018  . Seasonal allergies 12/10/2018  . Gout 09/08/2018  . 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 Sacral wound 06/08/2018  . Iron deficiency anemia due to chronic blood loss 06/08/2018  . Hyponatremia 06/08/2018  . Constipation 06/08/2018  . Encounter for nasogastric (NG) tube placement   . Pressure injury of skin 05/22/2018  . PAF (paroxysmal atrial fibrillation) ([place]) 05/11/2018  . Normocytic anemia 06/14/2017  . Aortic stenosis, severe 06/10/2017  . Carotid artery disease ([place]) 06/10/2017  . Essential hypertension 05/13/2017  . Hyperlipidemia 05/13/2017  . Bilateral lower extremity edema 05/13/2017  . Port-A-Cath in place 04/28/2017  . MDS (myelodysplastic syndrome) ([place]) 03/20/2017  . Goals of care, counseling/discussion 03/20/2017  . Anemia in chronic kidney disease 05/10/2016  . Anemia in stage 1 chronic kidney disease 05/10/2016  . Anemia due to chronic kidney disease 02/09/2016    Past Surgical History:  Procedure Laterality Date  . COLON SURGERY    . DEBRIDMENT OF DECUBITUS ULCER N/A 06/12/2018   Procedure:  DEBRIDMENT OF DECUBITUS ULCER;  Surgeon: Wallace Going, DO;  Location: [facility]S;  Service: Plastics;  Laterality: N/A;  . IR FLUORO GUIDE PORT INSERTION RIGHT  04/02/2017  . IR REMOVAL TUN ACCESS W/ PORT W/O FL MOD SED  03/16/2019  . IR US GUIDE VASC ACCESS RIGHT  04/02/2017  . RIGHT HEART CATH N/A  04/23/2019   Procedure: RIGHT HEART CATH;  Surgeon: Jolaine Artist, MD;  Location: [facility];  Service: Cardiovascular;  Laterality: N/A;  . RIGHT/LEFT HEART CATH AND CORONARY ANGIOGRAPHY N/A 05/21/2018   Procedure: RIGHT/LEFT HEART CATH AND CORONARY ANGIOGRAPHY;  Surgeon: Nelva Bush, MD;  Location: [facility];  Service: Cardiovascular;  Laterality: N/A;     OB History   No obstetric history on file.     Family History  Problem Relation Age of Onset  . Hypertension Mother   . Diabetes Mother   . Cervical cancer Mother   . Heart attack Father   . Hypertension Sister   . Hypertension Brother   . Hypertension Sister   . Hypertension Sister   . Prostate cancer Brother   . HIV/AIDS Brother     Social History   Tobacco Use  . Smoking status: Former Smoker    Packs/day: 0.25    Years: 20.00    Pack years: 5.00    Types: Cigarettes    Quit date: 01/31/2001    Years since quitting: 19.5  . Smokeless tobacco: Former Network engineer  . Vaping Use: Never used  Substance Use Topics  . Alcohol use: Yes    Alcohol/week: 2.0 standard drinks    Types: 2 Shots of liquor per week    Comment: occ  . Drug use: Never    Home Medications Prior to Admission medications   Medication Sig Start Date End Date Taking? Authorizing Provider  allopurinol (ZYLOPRIM) 300 MG tablet Take 300 mg by mouth daily. 07/05/20  Yes [provider]  amiodarone (PACERONE) 200 MG tablet Take 1 tablet (200 mg total) by mouth daily. 07/19/20  Yes Bensimhon, Shaune Pascal, MD  atorvastatin (LIPITOR) 40 MG tablet Take 1 tablet by mouth once daily Patient taking differently: Take 40 mg by mouth daily. 08/09/20  Yes [name], FNP  carvedilol (COREG) 12.5 MG tablet Take 1 tablet (12.5 mg total) by mouth 2 (two) times daily. 05/22/20 08/20/20 Yes Skeet Latch, MD  cloNIDine (CATAPRES) 0.1 MG tablet Take 1 tablet (0.1 mg total) by mouth 2 (two) times daily. 07/03/20  Yes Bensimhon,  Shaune Pascal, MD  furosemide (LASIX) 40 MG tablet Take 1 tablet (40 mg total) by mouth daily. Take 80 mg in AM and 40 mg in PM (4PM) 07/09/20  Yes Rizwan, Eunice Blase, MD  hydrALAZINE (APRESOLINE) 100 MG tablet TAKE 1 TABLET BY MOUTH THREE TIMES DAILY Patient taking differently: Take 100 mg by mouth 3 (three) times daily. 08/29/20  Yes Lorretta Harp, MD  Insulin Glargine (BASAGLAR KWIKPEN) 100 UNIT/ML Inject 0.14 mLs (14 Units total) into the skin daily. Patient taking differently: Inject 18 Units into the skin daily as needed (high blood sugar). Takes if blood sugar is over 150 02/03/20  Yes [name], FNP  latanoprost (XALATAN) 0.005 % ophthalmic solution Place 1 drop into both eyes at bedtime.    Yes [provider]  levothyroxine (SYNTHROID) 137 MCG tablet Take 1 tablet (137 mcg total) by mouth daily before breakfast. 08/10/20  Yes Ghumman, Ramandeep, NP  linagliptin (TRADJENTA) 5 MG TABS tablet  Take 1 tablet (5 mg total) by mouth daily. 02/03/20  Yes [name], FNP  loratadine (CLARITIN) 10 MG tablet Take 10 mg by mouth daily.   Yes [provider]  Multiple Vitamins-Minerals (CENTRUM SILVER 50+WOMEN) TABS Take 1 tablet by mouth daily.   Yes [provider]  pantoprazole (PROTONIX) 40 MG tablet Take 40 mg by mouth daily. 08/29/20  Yes [provider]  sildenafil (REVATIO) 20 MG tablet TAKE 1 TABLET BY MOUTH THREE TIMES DAILY Patient taking differently: Take 20 mg by mouth 3 (three) times daily. 08/09/20  Yes Bensimhon, Shaune Pascal, MD  sodium bicarbonate 650 MG tablet Take 650 mg by mouth 2 (two) times daily. 06/27/20  Yes [provider]  sodium chloride (OCEAN) 0.65 % nasal spray Place 1 spray into the nose in the morning, at noon, in the evening, and at bedtime.   Yes [provider]  blood glucose meter kit and supplies Dispense based on patient and insurance preference. Use up to four times daily as directed. (FOR ICD-10 E10.9, E11.9). 11/04/18    [name], FNP  Blood Glucose Monitoring Suppl [facility]) w/Device KIT Check blood sugars twice daily E11.9 11/30/19   [name], FNP  fluticasone (FLONASE) 50 MCG/ACT nasal spray Place 1 spray into both nostrils daily. Patient not taking: No sig reported 01/28/20   Collene Gobble, MD  glucose blood test strip check blood sugars twice daily Dx code E11.22 03/02/20   [name], FNP  Insulin Pen Needle 29G X 5MM MISC Use as directed 06/16/17   Bonnielee Haff, MD  Lancets (ONETOUCH DELICA PLUS [id number]) [place] check blood sugars twice daily Dx code: E11.22 03/02/20   [name], FNP    Allergies    Ancef [cefazolin], Lactose intolerance (gi), and Sulfa antibiotics  Review of Systems   Review of Systems  Constitutional: Positive for fatigue. Negative for fever.  HENT: Positive for nosebleeds (saw ENT). Negative for sore throat. Hearing loss: ears feel stopped up, saw ENT.   Eyes: Negative for visual disturbance.  Respiratory: Positive for shortness of breath. Negative for cough.   Cardiovascular: Negative for chest pain.  Gastrointestinal: Negative for abdominal pain, diarrhea, nausea and vomiting. Blood in stool: dark black stool.  Genitourinary: Negative for difficulty urinating.  Skin: Negative for rash.  Neurological: Positive for light-headedness. Negative for syncope, weakness, numbness and headaches.    Physical Exam Updated Vital Signs BP (!) 169/37   Pulse (!) 56   Temp 98.2 F (36.8 C) (Oral)   Resp 18   Ht '5\' 4"'  (1.626 m)   Wt 90.7 kg   SpO2 100%   BMI 34.33 kg/m   Physical Exam Vitals and nursing note reviewed.  Constitutional:      General: She is not in acute distress.    Appearance: She is well-developed and well-nourished. She is not diaphoretic.  HENT:     Head: Normocephalic and atraumatic.  Eyes:     Extraocular Movements: EOM normal.     Conjunctiva/sclera: Conjunctivae normal.  Cardiovascular:     Rate and Rhythm: Normal rate and  regular rhythm.     Pulses: Intact distal pulses.     Heart sounds: Normal heart sounds. No murmur heard. No friction rub. No gallop.   Pulmonary:     Effort: Pulmonary effort is normal. No respiratory distress.     Breath sounds: Normal breath sounds. No wheezing or rales.  Abdominal:     General: There is no distension.  Palpations: Abdomen is soft.     Tenderness: There is no abdominal tenderness. There is no guarding.  Genitourinary:    Comments: scant brown stool on exam  Musculoskeletal:        General: No tenderness or edema.     Cervical back: Normal range of motion.  Skin:    General: Skin is warm and dry.     Findings: No erythema or rash.  Neurological:     Mental Status: She is alert and oriented to person, place, and time.     ED Results / Procedures / Treatments   Labs (all labs ordered are listed, but only abnormal results are displayed) Labs Reviewed  RETICULOCYTES - Abnormal; Notable for the following components:      Result Value   RBC. 1.94 (*)    All other components within normal limits  LACTATE DEHYDROGENASE - Abnormal; Notable for the following components:   LDH 198 (*)    All other components within normal limits  IRON AND TIBC - Abnormal; Notable for the following components:   Iron 248 (*)    Saturation Ratios 93 (*)    All other components within normal limits  FERRITIN - Abnormal; Notable for the following components:   Ferritin 2,236 (*)    All other components within normal limits  VITAMIN B12 - Abnormal; Notable for the following components:   Vitamin B-12 1,334 (*)    All other components within normal limits  GLUCOSE, CAPILLARY - Abnormal; Notable for the following components:   Glucose-Capillary 244 (*)    All other components within normal limits  POC OCCULT BLOOD, ED - Abnormal; Notable for the following components:   Fecal Occult Bld POSITIVE (*)    All other components within normal limits  SARS CORONAVIRUS 2 (TAT 6-24 HRS)   FOLATE  COMPREHENSIVE METABOLIC PANEL  HEMOGLOBIN AND HEMATOCRIT, BLOOD  HEMOGLOBIN AND HEMATOCRIT, BLOOD  POC OCCULT BLOOD, ED  DIRECT ANTIGLOBULIN TEST (NOT AT [facility])    EKG EKG Interpretation  Date/Time:  Thursday August 31 2020 12:23:04 EST Ventricular Rate:  56 PR Interval:    QRS Duration: 97 QT Interval:  518 QTC Calculation: 500 R Axis:   33 Text Interpretation: Artifact, suspect sinus rhythm Borderline repolarization abnormality Borderline prolonged QT interval Confirmed by Gareth Morgan [phone]) on 08/31/2020 2:48:45 PM   Radiology CT ABDOMEN PELVIS WO CONTRAST  Result Date: 08/31/2020 CLINICAL DATA:  Low hemoglobin anemia EXAM: CT ABDOMEN AND PELVIS WITHOUT CONTRAST TECHNIQUE: Multidetector CT imaging of the abdomen and pelvis was performed following the standard protocol without IV contrast. COMPARISON:  CT 06/08/2018, 05/08/2017 FINDINGS: Lower chest: Lung bases demonstrate no acute consolidation or effusion. There is cardiomegaly. Differential cardiac blood pool consistent with history of anemia. Hepatobiliary: No focal hepatic abnormality. No calcified gallstone or biliary dilatation. Pancreas: No inflammatory change or ductal dilatation Spleen: Normal in size without focal abnormality. Adrenals/Urinary Tract: Adrenal glands are normal. Kidneys show no hydronephrosis. The urinary bladder is unremarkable Stomach/Bowel: Stomach nonenlarged. No dilated small bowel. No acute bowel wall thickening. Scattered sigmoid colon diverticula. Vascular/Lymphatic: Extensive aortic atherosclerosis and calcified plaque including chronic subocclusive calcification within the infrarenal abdominal aorta. No suspicious nodes. Reproductive: Multiple calcified uterine masses consistent with fibroids. No adnexal mass. Other: Negative for free air or free fluid. Small fat containing umbilical hernia Musculoskeletal: No acute or significant osseous findings. IMPRESSION: 1. No CT evidence for acute  intra-abdominal or pelvic abnormality. 2. Cardiomegaly. Differential cardiac blood pool likely corresponds to history of  anemia. 3. Calcified uterine fibroids. Aortic Atherosclerosis (ICD10-I70.0). Electronically Signed   By: Donavan Foil M.D.   On: 08/31/2020 19:24    Procedures .Critical Care Performed by: Gareth Morgan, MD Authorized by: Gareth Morgan, MD   Critical care provider statement:    Critical care time (minutes):  45   Critical care was time spent personally by me on the following activities:  Discussions with consultants, evaluation of patient's response to treatment, examination of patient, ordering and performing treatments and interventions, ordering and review of laboratory studies, ordering and review of radiographic studies, pulse oximetry, re-evaluation of patient's condition, obtaining history from patient or surrogate and review of old charts     Medications Ordered in ED Medications  amiodarone (PACERONE) tablet 200 mg (has no administration in time range)  allopurinol (ZYLOPRIM) tablet 300 mg (has no administration in time range)  atorvastatin (LIPITOR) tablet 40 mg (has no administration in time range)  carvedilol (COREG) tablet 12.5 mg (12.5 mg Oral Given 08/31/20 2120)  cloNIDine (CATAPRES) tablet 0.1 mg (0.1 mg Oral Given 08/31/20 2119)  sildenafil (REVATIO) tablet 20 mg (has no administration in time range)  levothyroxine (SYNTHROID) tablet 137 mcg (has no administration in time range)  linagliptin (TRADJENTA) tablet 5 mg (has no administration in time range)  sodium bicarbonate tablet 650 mg (has no administration in time range)  loratadine (CLARITIN) tablet 10 mg (10 mg Oral Given 08/31/20 1923)  sodium chloride (OCEAN) 0.65 % nasal spray 1 spray (has no administration in time range)  latanoprost (XALATAN) 0.005 % ophthalmic solution 1 drop (has no administration in time range)  insulin aspart (novoLOG) injection 0-15 Units (has no administration in  time range)  acetaminophen (TYLENOL) tablet 650 mg (has no administration in time range)    Or  acetaminophen (TYLENOL) suppository 650 mg (has no administration in time range)  ondansetron (ZOFRAN) tablet 4 mg (has no administration in time range)    Or  ondansetron (ZOFRAN) injection 4 mg (has no administration in time range)  pantoprazole (PROTONIX) injection 40 mg (40 mg Intravenous Given 08/31/20 2120)  furosemide (LASIX) tablet 40 mg (40 mg Oral Given 08/31/20 1923)  0.9 %  sodium chloride infusion (Manually program via Guardrails IV Fluids) (250 mLs Intravenous Not Given 08/31/20 2219)  furosemide (LASIX) injection 20 mg (20 mg Intravenous Not Given 08/31/20 2218)  acetaminophen (TYLENOL) tablet 650 mg (650 mg Oral Not Given 08/31/20 2219)  diphenhydrAMINE (BENADRYL) capsule 25 mg (25 mg Oral Not Given 08/31/20 2219)  0.9 %  sodium chloride infusion (10 mL/hr Intravenous Rate/Dose Verify 08/31/20 2121)    ED Course  I have reviewed the triage vital signs and the nursing notes.  Pertinent labs & imaging results that were available during my care of the patient were reviewed by me and considered in my medical decision making (see chart for details).    MDM Rules/Calculators/A&P                          65yo female with history of DM, CHF, CKD, htn, colon cancer 2008, myelodysplastic syndrome, symptomatic anemia for which she has been transfusion dependent with hemoglobin 6.6 1/28 and received 2U pRBC who presents from cancer center with concern for symptomatic anemia and hemoglobin of 4.4. Hemodynamically stable, no sign of severe GI bleed on exam and hemoccult negative, although history of dark stool recently and more significant anemia than baseline may indicate additional GI losses in setting of chronic anemia. Contacted  [facility] initially, although later notified she sees different [place] physician att this time.  Transfused 2U pRBC to begin with and Dr. Marylyn Ishihara consulted for admission for  symptomatic anemia    Final Clinical Impression(s) / ED Diagnoses Final diagnoses:  Symptomatic anemia    Rx / DC Orders ED Discharge Orders    None       Gareth Morgan, MD 08/31/20 2249

## 2020-08-31 NOTE — Plan of Care (Signed)

## 2020-08-31 NOTE — Progress Notes (Signed)
Patient sitting in lobby SOB, dizzy, with Hgb 4.1. VS obtained and patient escorted to the ED by Sandi Mealy PA.

## 2020-08-31 NOTE — ED Notes (Signed)
Attempted to call report, RN said to call back in 5 minutes.

## 2020-08-31 NOTE — Telephone Encounter (Signed)
CRITICAL VALUE STICKER  CRITICAL VALUE: Hgb 4.1  RECEIVER (on-site recipient of call): Evette Georges, [place] NOTIFIED: 11:20 am  MESSENGER (representative from lab): Pam  MD NOTIFIED: Dr. Irene Limbo  TIME OF NOTIFICATION: 11:23 am   RESPONSE: Critical lab given to Aura Fey, RN to notify MD

## 2020-08-31 NOTE — ED Triage Notes (Signed)
Pt sent from cancer center for low hgb- 4 and hct 12, hx of MDS and anemia secondary to CKD. C/o weakness.

## 2020-08-31 NOTE — Telephone Encounter (Signed)
Received lab results of creatinine of 5.01 Dr. Irene Limbo made aware.  Pt's HGb is 4.1 and will be going to the ED per Sandi Mealy, PA

## 2020-08-31 NOTE — ED Notes (Signed)
Per Lucianne Lei, states Hgb 4-states she is symptomatic

## 2020-08-31 NOTE — H&P (Signed)
History and Physical    Patricia Mata HQP:[id number] DOB: [birth year] DOA: 08/31/2020  PCP: [name], FNP  Patient coming from: [place]  Chief Complaint: weakness  HPI: Patricia Mata is a 65 y.o. female with medical history significant of MDS, CKD V, DM2, Hx of colon CA, AS/MR, Chronic HFpEF. Presenting with weakness. She reports that for the last 2 days, she has found it hard to get around the house. She feels generally weak and worn out when she moves. Her husband has had to help her with standing and dressing. She reports she's had blurred vision and felt faint. She went to her regularly scheduled cancer appointment today and told them about her symptoms. They drew labs and found she was severely anemic. They recommended that she come to the ED.     Of note, she visited the ENT yesterday due to intermittent nose bleeds. When she was examined, she did not have an active bleed. It was recommended that she use nasal saline. She had not noted any further bleeds since.  Of note, she reports dark stools for 2 weeks that ended 2 days ago. She denies frank blood or hematemesis.   ED Course: 2 units pRBCs were ordered. TRH was called for admission.   Review of Systems:  Denies CP, palpitations, N/V/D/F, hematemesis. Reports lightheadedness, near syncope. Review of systems is otherwise negative for all not mentioned in HPI.   PMHx Past Medical History:  Diagnosis Date  . Acute renal failure (ARF) ([employer]) 06/08/2018  . Cancer [facility]) 2008   Colon   . Chronic diastolic (congestive) heart failure ([place])   . Diabetes mellitus without complication ([place])   . Gout 09/08/2018  . Hypertension   . Macrocytic anemia   . MDS (myelodysplastic syndrome) ([place])   . Stage 4 chronic kidney disease ([place])     PSHx Past Surgical History:  Procedure Laterality Date  . COLON SURGERY    . DEBRIDMENT OF DECUBITUS ULCER N/A 06/12/2018   Procedure: DEBRIDMENT OF DECUBITUS ULCER;  Surgeon:  Wallace Going, DO;  Location: [facility];  Service: Plastics;  Laterality: N/A;  . IR FLUORO GUIDE PORT INSERTION RIGHT  04/02/2017  . IR REMOVAL TUN ACCESS W/ PORT W/O FL MOD SED  03/16/2019  . IR US GUIDE VASC ACCESS RIGHT  04/02/2017  . RIGHT HEART CATH N/A 04/23/2019   Procedure: RIGHT HEART CATH;  Surgeon: Jolaine Artist, MD;  Location: [place] [facility];  Service: Cardiovascular;  Laterality: N/A;  . RIGHT/LEFT HEART CATH AND CORONARY ANGIOGRAPHY N/A 05/21/2018   Procedure: RIGHT/LEFT HEART CATH AND CORONARY ANGIOGRAPHY;  Surgeon: Nelva Bush, MD;  Location: [place] [facility];  Service: Cardiovascular;  Laterality: N/A;    SocHx  reports that she quit smoking about 19 years ago. Her smoking use included cigarettes. She has a 5.00 pack-year smoking history. She has quit using smokeless tobacco. She reports current alcohol use of about 2.0 standard drinks of alcohol per week. She reports that she does not use drugs.  Allergies  Allergen Reactions  . Ancef [Cefazolin] Itching    Severe itching- after procedure, ancef was the antibiotic.-04/02/17 Tolerates penicillins  . Lactose Intolerance (Gi) Diarrhea  . Sulfa Antibiotics Rash and Other (See Comments)    Blisters, also    FamHx Family History  Problem Relation Age of Onset  . Hypertension Mother   . Diabetes Mother   . Cervical cancer Mother   . Heart attack Father   . Hypertension Sister   .  Hypertension Brother   . Hypertension Sister   . Hypertension Sister   . Prostate cancer Brother   . HIV/AIDS Brother     Prior to Admission medications   Medication Sig Start Date End Date Taking? Authorizing Provider  allopurinol (ZYLOPRIM) 300 MG tablet Take 300 mg by mouth daily. 07/05/20   [provider]  amiodarone (PACERONE) 200 MG tablet Take 1 tablet (200 mg total) by mouth daily. 07/19/20   Bensimhon, Shaune Pascal, MD  atorvastatin (LIPITOR) 40 MG tablet Take 1 tablet by mouth once daily 08/09/20   [name], FNP  blood glucose meter kit and supplies Dispense based on patient and insurance preference. Use up to four times daily as directed. (FOR ICD-10 E10.9, E11.9). 11/04/18   [name], FNP  Blood Glucose Monitoring Suppl [facility] Karsten Fells) w/Device KIT Check blood sugars twice daily E11.9 11/30/19   [name], FNP  carvedilol (COREG) 12.5 MG tablet Take 1 tablet (12.5 mg total) by mouth 2 (two) times daily. 05/22/20 08/20/20  Skeet Latch, MD  cloNIDine (CATAPRES) 0.1 MG tablet Take 1 tablet (0.1 mg total) by mouth 2 (two) times daily. 07/03/20   Bensimhon, Shaune Pascal, MD  fluticasone (FLONASE) 50 MCG/ACT nasal spray Place 1 spray into both nostrils daily. 01/28/20   Collene Gobble, MD  furosemide (LASIX) 40 MG tablet Take 1 tablet (40 mg total) by mouth daily. Take 80 mg in AM and 40 mg in PM (4PM) 07/09/20   Debbe Odea, MD  glucose blood test strip check blood sugars twice daily Dx code E11.22 03/02/20   [name], FNP  hydrALAZINE (APRESOLINE) 100 MG tablet TAKE 1 TABLET BY MOUTH THREE TIMES DAILY 08/29/20   Lorretta Harp, MD  Insulin Glargine (BASAGLAR KWIKPEN) 100 UNIT/ML Inject 0.14 mLs (14 Units total) into the skin daily. 02/03/20   [name], FNP  Insulin Pen Needle 29G X 5MM MISC Use as directed 06/16/17   Bonnielee Haff, MD  Lancets (ONETOUCH DELICA PLUS [id number]) [place] check blood sugars twice daily Dx code: E11.22 03/02/20   [name], FNP  latanoprost (XALATAN) 0.005 % ophthalmic solution Place 1 drop into both eyes at bedtime.     [provider]  levothyroxine (SYNTHROID) 137 MCG tablet Take 1 tablet (137 mcg total) by mouth daily before breakfast. 08/10/20   Ghumman, Ramandeep, NP  linagliptin (TRADJENTA) 5 MG TABS tablet Take 1 tablet (5 mg total) by mouth daily. 02/03/20   [name], FNP  loratadine (CLARITIN) 10 MG tablet Take 10 mg by mouth daily. 07/02/20   [provider]  Multiple Vitamins-Minerals (CENTRUM SILVER 50+WOMEN) TABS  Take 1 tablet by mouth daily.    [provider]  pantoprazole (PROTONIX) 40 MG tablet Take 40 mg by mouth daily. 08/29/20   [provider]  sildenafil (REVATIO) 20 MG tablet TAKE 1 TABLET BY MOUTH THREE TIMES DAILY 08/09/20   Bensimhon, Shaune Pascal, MD  sodium bicarbonate 650 MG tablet Take 650 mg by mouth 2 (two) times daily. 06/27/20   [provider]    Physical Exam: Vitals:   08/31/20 1245 08/31/20 1315 08/31/20 1315 08/31/20 1330  BP: (!) 122/38 (!) 132/43 (!) 132/43 (!) 127/39  Pulse: (!) 55 (!) 57 (!) 56 (!) 55  Resp: _0 Temp: 97.8 F (36.6 C)  98 F (36.7 C)   TempSrc: Oral  Oral   SpO2: 100% 100% 100% 100%  Weight:      Height:  General: 65 y.o. female resting in bed in NAD Eyes: PERRL, normal sclera ENMT: Nares patent w/o discharge, orophaynx clear, dentition normal, ears w/o discharge/lesions/ulcers Neck: Supple, trachea midline Cardiovascular: brady, +S1, S2, no g/r, 4/6 holosystolic murmur, equal pulses throughout Respiratory: CTABL, no w/r/r, normal WOB GI: BS+, NDNT, no masses noted, no organomegaly noted MSK: No e/c/c Skin: No rashes, bruises, ulcerations noted Neuro: A&O x 3, no focal deficits Psyc: Appropriate interaction and affect, calm/cooperative  Labs on Admission: I have personally reviewed following labs and imaging studies  CBC: Recent Labs  Lab 08/31/20 1051  WBC 5.4  NEUTROABS 4.0  HGB 4.1*  HCT 12.8*  MCV 94.1  PLT 790*   Basic Metabolic Panel: Recent Labs  Lab 08/31/20 1051  NA 132*  K 4.8  CL 100  CO2 19*  GLUCOSE 159*  BUN 117*  CREATININE 5.01*  CALCIUM 9.0   GFR: Estimated Creatinine Clearance: 12.2 mL/min (A) (by C-G formula based on SCr of 5.01 mg/dL [facility])). Liver Function Tests: Recent Labs  Lab 08/31/20 1051  AST 62*  ALT 35  ALKPHOS 86  BILITOT 1.2  PROT 6.2*  ALBUMIN 3.3*   No results for input(s): LIPASE, AMYLASE in the last 168 hours. No results for input(s):  AMMONIA in the last 168 hours. Coagulation Profile: No results for input(s): INR, PROTIME in the last 168 hours. Cardiac Enzymes: No results for input(s): CKTOTAL, CKMB, CKMBINDEX, TROPONINI in the last 168 hours. BNP (last 3 results) No results for input(s): PROBNP in the last 8760 hours. HbA1C: No results for input(s): HGBA1C in the last 72 hours. CBG: No results for input(s): GLUCAP in the last 168 hours. Lipid Profile: No results for input(s): CHOL, HDL, LDLCALC, TRIG, CHOLHDL, LDLDIRECT in the last 72 hours. Thyroid Function Tests: No results for input(s): TSH, T4TOTAL, FREET4, T3FREE, THYROIDAB in the last 72 hours. Anemia Panel: No results for input(s): VITAMINB12, FOLATE, FERRITIN, TIBC, IRON, RETICCTPCT in the last 72 hours. Urine analysis:    Component Value Date/Time   COLORURINE YELLOW 07/06/2020 1706   APPEARANCEUR HAZY (A) 07/06/2020 1706   LABSPEC 1.008 07/06/2020 1706   PHURINE 5.0 07/06/2020 1706   GLUCOSEU NEGATIVE 07/06/2020 1706   HGBUR NEGATIVE 07/06/2020 1706   BILIRUBINUR NEGATIVE 07/06/2020 1706   BILIRUBINUR negative 05/11/2020 1152   KETONESUR NEGATIVE 07/06/2020 1706   PROTEINUR 100 (A) 07/06/2020 1706   UROBILINOGEN 0.2 05/11/2020 1152   NITRITE NEGATIVE 07/06/2020 1706   LEUKOCYTESUR SMALL (A) 07/06/2020 1706    Radiological Exams on Admission: No results found.  EKG: Independently reviewed. Sinus brady, no st changes  Assessment/Plan Symptomatic anemia GIB     - admit to inpatient, tele     - multifactorial (MDS, Hx of CKD, possible GIB)     - 2 units pRBCs ordered in ED     - start protonix     - q6h H&H     - she has two hemoccults (one positive and one negative); spoke with [facility], they recommend CT ab/pelvis and they will follow her in the AM  MDS     - consulted onco, appreciate assistance, they will follow     - follow up labs  CKD V not on dialysis     - spoke with nephro (Dr. Joelyn Oms); no acute renal need as labs are  stable from recent visit     - she was supposed to have access placed for the possibility of dialysis; will hold on that to treat acute  issues  DM2     - SSI, A1c, glucose checks, DM2 diet  Chronic HFpEF A fib PAH Aortic stenosis/Mitral Regurg     - continue home regimen  Hypothyroidism     - continue home regimen  HLD     - continue home regimen  DVT prophylaxis: SCD  Code Status: FULL  Family Communication: W/ with husband  Consults called: GI ([place]), Onco; spoke with nephro (no acute renal need)  Status is: Inpatient  Remains inpatient appropriate because:Inpatient level of care appropriate due to severity of illness   Dispo: The patient is from: Home              Anticipated d/c is to: Home              Anticipated d/c date is: 2 days              Patient currently is not medically stable to d/c.   Difficult to place patient No  Jonnie Finner DO [facility]  If 7PM-7AM, please contact night-coverage [email or web address]  08/31/2020, 2:32 PM

## 2020-08-31 NOTE — Consult Note (Incomplete)
Reason for Consult: Symptomatic anemia and melena Referring Physician: [facility].  Patricia Mata HPI: This is a 65 year old female with a PMH of MDS, requiring routine transfusions of PRBC, history of epistaxis, CKD, s/p right hemicolectomy for colon cancer in 2008 who is admitted for symptomatic anemia.  She was identified to be anemic with a value of 4 g/dL in the oncology clinic.  She was transferred over to the ER for further evaluation.  The patient states that she had melenic stools for the past two weeks.  A hemoccult was obtained and it was NEGATIVE.  This reporting discrepancy in Epic was clarified by Dr. Billy Fischer via secure chat.  She contacted the lab and it was confirmed that she was hemoccult negative.    Past Medical History:  Diagnosis Date  . Acute renal failure (ARF) ([place]) 06/08/2018  . Cancer [facility]) 2008   Colon   . Chronic diastolic (congestive) heart failure ([place])   . Diabetes mellitus without complication ([place])   . Gout 09/08/2018  . Hypertension   . Macrocytic anemia   . MDS (myelodysplastic syndrome) ([place])   . Stage 4 chronic kidney disease [facility])     Past Surgical History:  Procedure Laterality Date  . COLON SURGERY    . DEBRIDMENT OF DECUBITUS ULCER N/A 06/12/2018   Procedure: DEBRIDMENT OF DECUBITUS ULCER;  Surgeon: Wallace Going, DO;  Location: [facility];  Service: Plastics;  Laterality: N/A;  . IR FLUORO GUIDE PORT INSERTION RIGHT  04/02/2017  . IR REMOVAL TUN ACCESS W/ PORT W/O FL MOD SED  03/16/2019  . IR US GUIDE VASC ACCESS RIGHT  04/02/2017  . RIGHT HEART CATH N/A 04/23/2019   Procedure: RIGHT HEART CATH;  Surgeon: Jolaine Artist, MD;  Location: [place] [facility];  Service: Cardiovascular;  Laterality: N/A;  . RIGHT/LEFT HEART CATH AND CORONARY ANGIOGRAPHY N/A 05/21/2018   Procedure: RIGHT/LEFT HEART CATH AND CORONARY ANGIOGRAPHY;  Surgeon: Nelva Bush, MD;  Location: [place] [facility];  Service: Cardiovascular;  Laterality: N/A;     Family History  Problem Relation Age of Onset  . Hypertension Mother   . Diabetes Mother   . Cervical cancer Mother   . Heart attack Father   . Hypertension Sister   . Hypertension Brother   . Hypertension Sister   . Hypertension Sister   . Prostate cancer Brother   . HIV/AIDS Brother     Social History:  reports that she quit smoking about 19 years ago. Her smoking use included cigarettes. She has a 5.00 pack-year smoking history. She has quit using smokeless tobacco. She reports current alcohol use of about 2.0 standard drinks of alcohol per week. She reports that she does not use drugs.  Allergies:  Allergies  Allergen Reactions  . Ancef [Cefazolin] Itching    Severe itching- after procedure, ancef was the antibiotic.-04/02/17 Tolerates penicillins  . Lactose Intolerance (Gi) Diarrhea  . Sulfa Antibiotics Rash and Other (See Comments)    Blisters, also    Medications: {medication reviewed/display:[id number]}  Results for orders placed or performed during the hospital encounter of 08/31/20 (from the past 24 hour(s))  POC occult blood, ED     Status: Abnormal   Collection Time: 08/31/20 12:57 PM  Result Value Ref Range   Fecal Occult Bld POSITIVE (A) NEGATIVE  POC occult blood, ED     Status: None   Collection Time: 08/31/20  1:02 PM  Result Value Ref Range   Fecal Occult Bld NEGATIVE  NEGATIVE   *Note: Due to a large number of results and/or encounters for the requested time period, some results have not been displayed. A complete set of results can be found in Results Review.     No results found.  ROS:  As stated above in the HPI otherwise negative.  Blood pressure (!) 129/42, pulse (!) 56, temperature 98 F (36.7 C), temperature source Oral, resp. rate 19, height 5\' 4"  (1.626 m), weight 90.7 kg, SpO2 100 %.    PE: Gen: NAD, Alert and Oriented HEENT:  [place]/AT, EOMI Neck: Supple, no LAD Lungs: CTA Bilaterally CV: RRR without M/G/R ABD: Soft, NTND, +BS Ext: No  C/C/E  Assessment/Plan: ***  HUNG,PATRICK D 08/31/2020, 3:22 PM

## 2020-09-01 ENCOUNTER — Ambulatory Visit (HOSPITAL_COMMUNITY): Payer: BC Managed Care – PPO

## 2020-09-01 ENCOUNTER — Encounter (HOSPITAL_COMMUNITY): Payer: Self-pay | Admitting: Internal Medicine

## 2020-09-01 ENCOUNTER — Encounter (HOSPITAL_COMMUNITY)
Admission: EM | Disposition: A | Discharge status: Home / Self Care | Payer: Self-pay | Source: Ambulatory Visit | Attending: Family Medicine

## 2020-09-01 ENCOUNTER — Encounter: Payer: Medicare Other | Admitting: Vascular Surgery

## 2020-09-01 ENCOUNTER — Inpatient Hospital Stay (HOSPITAL_COMMUNITY): Payer: Medicare Other | Admitting: Certified Registered Nurse Anesthetist

## 2020-09-01 ENCOUNTER — Encounter: Payer: BC Managed Care – PPO | Admitting: Vascular Surgery

## 2020-09-01 DIAGNOSIS — D649 Anemia, unspecified: Secondary | ICD-10-CM | POA: Diagnosis not present

## 2020-09-01 DIAGNOSIS — D469 Myelodysplastic syndrome, unspecified: Secondary | ICD-10-CM

## 2020-09-01 HISTORY — PX: GIVENS CAPSULE STUDY: SHX5432

## 2020-09-01 HISTORY — PX: ESOPHAGOGASTRODUODENOSCOPY: SHX5428

## 2020-09-01 LAB — TYPE AND SCREEN
ABO/RH(D): A NEG
Antibody Screen: NEGATIVE
Unit division: 0
Unit division: 0

## 2020-09-01 LAB — BPAM RBC
Blood Product Expiration Date: 202203112359
Blood Product Expiration Date: 202203112359
ISSUE DATE / TIME: 202202171229
ISSUE DATE / TIME: 202202171739
Unit Type and Rh: 600
Unit Type and Rh: 600

## 2020-09-01 LAB — COMPREHENSIVE METABOLIC PANEL
ALT: 34 U/L (ref 0–44)
AST: 57 U/L — ABNORMAL HIGH (ref 15–41)
Albumin: 3.4 g/dL — ABNORMAL LOW (ref 3.5–5.0)
Alkaline Phosphatase: 78 U/L (ref 38–126)
Anion gap: 14 (ref 5–15)
BUN: 102 mg/dL — ABNORMAL HIGH (ref 8–23)
CO2: 21 mmol/L — ABNORMAL LOW (ref 22–32)
Calcium: 9.6 mg/dL (ref 8.9–10.3)
Chloride: 100 mmol/L (ref 98–111)
Creatinine, Ser: 4.85 mg/dL — ABNORMAL HIGH (ref 0.44–1.00)
GFR, Estimated: 9 mL/min — ABNORMAL LOW (ref >60)
Glucose, Bld: 228 mg/dL — ABNORMAL HIGH (ref 70–99)
Potassium: 4.1 mmol/L (ref 3.5–5.1)
Sodium: 135 mmol/L (ref 135–145)
Total Bilirubin: 1.4 mg/dL — ABNORMAL HIGH (ref 0.3–1.2)
Total Protein: 6.6 g/dL (ref 6.5–8.1)

## 2020-09-01 LAB — PREPARE RBC (CROSSMATCH)

## 2020-09-01 LAB — GLUCOSE, CAPILLARY
Glucose-Capillary: 151 mg/dL — ABNORMAL HIGH (ref 70–99)
Glucose-Capillary: 104 mg/dL — ABNORMAL HIGH (ref 70–99)
Glucose-Capillary: 129 mg/dL — ABNORMAL HIGH (ref 70–99)
Glucose-Capillary: 188 mg/dL — ABNORMAL HIGH (ref 70–99)

## 2020-09-01 LAB — HEMOGLOBIN AND HEMATOCRIT, BLOOD
HCT: 18.9 % — ABNORMAL LOW (ref 36.0–46.0)
HCT: 20.1 % — ABNORMAL LOW (ref 36.0–46.0)
HCT: 23.3 % — ABNORMAL LOW (ref 36.0–46.0)
Hemoglobin: 6.4 g/dL — CL (ref 12.0–15.0)
Hemoglobin: 6.7 g/dL — CL (ref 12.0–15.0)
Hemoglobin: 8 g/dL — ABNORMAL LOW (ref 12.0–15.0)

## 2020-09-01 LAB — SARS CORONAVIRUS 2 (TAT 6-24 HRS): SARS Coronavirus 2: NEGATIVE

## 2020-09-01 SURGERY — EGD (ESOPHAGOGASTRODUODENOSCOPY)
Anesthesia: Monitor Anesthesia Care

## 2020-09-01 MED ORDER — PROPOFOL 10 MG/ML IV BOLUS
INTRAVENOUS | Status: DC | PRN
  Administered 2020-09-01 (×10): 20 mg via INTRAVENOUS

## 2020-09-01 MED ORDER — HYDRALAZINE HCL 50 MG PO TABS
100.0000 mg | ORAL_TABLET | Freq: Three times a day (TID) | ORAL | Status: DC
  Administered 2020-09-01 – 2020-09-02 (×3): 100 mg via ORAL
  Filled 2020-09-01 (×3): qty 2

## 2020-09-01 MED ORDER — SODIUM CHLORIDE 0.9% IV SOLUTION: Freq: Once | INTRAVENOUS | Status: AC

## 2020-09-01 MED ORDER — VASOPRESSIN 20 UNIT/ML IV SOLN
INTRAVENOUS | Status: AC
  Filled 2020-09-01: qty 1

## 2020-09-01 MED ORDER — SODIUM CHLORIDE 0.9 % IV SOLN: INTRAVENOUS | Status: DC | PRN

## 2020-09-01 MED ORDER — HYDRALAZINE HCL 20 MG/ML IJ SOLN: 10.0000 mg | Freq: Four times a day (QID) | INTRAMUSCULAR | Status: DC | PRN

## 2020-09-01 MED ORDER — LIDOCAINE 2% (20 MG/ML) 5 ML SYRINGE
INTRAMUSCULAR | Status: DC | PRN
  Administered 2020-09-01 (×2): 40 mg via INTRAVENOUS

## 2020-09-01 SURGICAL SUPPLY — 1 items: TOWEL COTTON PACK 4EA (MISCELLANEOUS) ×4 IMPLANT

## 2020-09-01 NOTE — Progress Notes (Signed)
Received report, per night shift RN Type and Screen had to be sent for blood, patient not ready for transfusion at this time. No blood phone call from blood bank received. Awaiting call. Patient alert and oriented x  4. RR even and unlabored, on room air. Bed low and locked. No needs identified, at this time. Will continue to monitor.

## 2020-09-01 NOTE — Progress Notes (Signed)
HEMATOLOGY-ONCOLOGY PROGRESS NOTE  SUBJECTIVE: [name] is followed in our clinic for MDS and symptomatic anemia.  She also has a history of colon cancer diagnosed in 2008.  Colon cancer was stage III and has not had any evidence of recurrent disease.  She came to our office today for lab work was found to have a hemoglobin of 4.1.  She was symptomatic with shortness of breath and dizziness.  She was sent to the emergency room for evaluation and transfusion.  So far, the patient is received she has PRBCs.  Hemoglobin has improved to 6.4 earlier this morning. A third unit of PRBCs is scheduled to be hung this morning.  She notes that she has been having intermittent epistaxis.  She is being seen by ENT for this.  She has also reported some dark stools over the past few weeks.  Stool for Hemoccult has been checked and was negative x1 and positive x1.  GI consult has been completed and they plan for EGD later today as well as capsule endoscopy.  The patient reports that she is feeling much better today to receiving blood.  Her fatigue and weakness as well as her shortness of breath have resolved.  No further dizziness.  She has not had any bleeding so far in the hospital that she is noted.  Oncology History  MDS (myelodysplastic syndrome) ([place])  03/20/2017 Initial Diagnosis   MDS (myelodysplastic syndrome) ([place])   06/15/2019 -  Chemotherapy    Patient is on Treatment Plan: MYELODYSPLASIA LUSPATERCEPT Q21D         REVIEW OF SYSTEMS:   Constitutional: Denies fevers, chills Eyes: Denies blurriness of vision Ears, nose, mouth, throat, and face: Denies mucositis or sore throat Respiratory: Shortness of breath improved following PRBC transfusion Cardiovascular: Denies palpitation, chest discomfort Gastrointestinal:  Denies nausea, heartburn or change in bowel habits.  Noted dark stools prior to admission. Skin: Denies abnormal skin rashes Lymphatics: Denies new lymphadenopathy or easy  bruising Neurological: Dizziness has resolved. Behavioral/Psych: Mood is stable, no new changes  Extremities: No lower extremity edema All other systems were reviewed with the patient and are negative.  I have reviewed the past medical history, past surgical history, social history and family history with the patient and they are unchanged from previous note.   PHYSICAL EXAMINATION: ECOG PERFORMANCE STATUS: 1 - Symptomatic but completely ambulatory  Vitals:   09/01/20 0636 09/01/20 0827  BP: (!) 137/36 (!) 130/39  Pulse: (!) 58 (!) 56  Resp: 20   Temp: 99.5 F (37.5 C) 98.2 F (36.8 C)  SpO2: 99% 100%   Filed Weights   08/31/20 1219 09/01/20 0500  Weight: 90.7 kg 95 kg    Intake/Output from previous day: 02/17 0701 - 02/18 0700 In: 702.9 [Blood:702.9] Out: -   GENERAL:alert, no distress and comfortable SKIN: skin color, texture, turgor are normal, no rashes or significant lesions EYES: No scleral icterus OROPHARYNX:no exudate, no erythema and lips, buccal mucosa, and tongue normal  LUNGS: clear to auscultation and percussion with normal breathing effort HEART: regular rate & rhythm,  no lower extremity edema ABDOMEN:abdomen soft, non-tender and normal bowel sounds Musculoskeletal:no cyanosis of digits and no clubbing  NEURO: alert & oriented x 3 with fluent speech, no focal motor/sensory deficits  LABORATORY DATA:  I have reviewed the data as listed CMP Latest Ref Rng & Units 09/01/2020 08/31/2020 08/11/2020  Glucose 70 - 99 mg/dL 228(H) 159(H) 124(H)  BUN 8 - 23 mg/dL 102(H) 117(H) 117(H)  Creatinine 0.44 -  1.00 mg/dL 4.85(H) 5.01(HH) 5.19(HH)  Sodium 135 - 145 mmol/L 135 132(L) 140  Potassium 3.5 - 5.1 mmol/L 4.1 4.8 3.9  Chloride 98 - 111 mmol/L 100 100 101  CO2 22 - 32 mmol/L 21(L) 19(L) 24  Calcium 8.9 - 10.3 mg/dL 9.6 9.0 9.3  Total Protein 6.5 - 8.1 g/dL 6.6 6.2(L) 7.1  Total Bilirubin 0.3 - 1.2 mg/dL 1.4(H) 1.2 0.6  Alkaline Phos 38 - 126 U/L 78 86 75  AST  15 - 41 U/L 57(H) 62(H) 33  ALT 0 - 44 U/L 34 35 18    Lab Results  Component Value Date   WBC 5.4 08/31/2020   HGB 6.4 (LL) 09/01/2020   HCT 18.9 (L) 09/01/2020   MCV 94.1 08/31/2020   PLT 125 (L) 08/31/2020   NEUTROABS 4.0 08/31/2020    CT ABDOMEN PELVIS WO CONTRAST  Result Date: 08/31/2020 CLINICAL DATA:  Low hemoglobin anemia EXAM: CT ABDOMEN AND PELVIS WITHOUT CONTRAST TECHNIQUE: Multidetector CT imaging of the abdomen and pelvis was performed following the standard protocol without IV contrast. COMPARISON:  CT 06/08/2018, 05/08/2017 FINDINGS: Lower chest: Lung bases demonstrate no acute consolidation or effusion. There is cardiomegaly. Differential cardiac blood pool consistent with history of anemia. Hepatobiliary: No focal hepatic abnormality. No calcified gallstone or biliary dilatation. Pancreas: No inflammatory change or ductal dilatation Spleen: Normal in size without focal abnormality. Adrenals/Urinary Tract: Adrenal glands are normal. Kidneys show no hydronephrosis. The urinary bladder is unremarkable Stomach/Bowel: Stomach nonenlarged. No dilated small bowel. No acute bowel wall thickening. Scattered sigmoid colon diverticula. Vascular/Lymphatic: Extensive aortic atherosclerosis and calcified plaque including chronic subocclusive calcification within the infrarenal abdominal aorta. No suspicious nodes. Reproductive: Multiple calcified uterine masses consistent with fibroids. No adnexal mass. Other: Negative for free air or free fluid. Small fat containing umbilical hernia Musculoskeletal: No acute or significant osseous findings. IMPRESSION: 1. No CT evidence for acute intra-abdominal or pelvic abnormality. 2. Cardiomegaly. Differential cardiac blood pool likely corresponds to history of anemia. 3. Calcified uterine fibroids. Aortic Atherosclerosis (ICD10-I70.0). Electronically Signed   By: Donavan Foil M.D.   On: 08/31/2020 19:24    ASSESSMENT AND PLAN: 65 year old woman with:    1.  MDS presented with IPSS-R score of 2.5 diagnosed in 2017.  She is currently on Reblozyl which has a decreased her transfusion dependence but has not eliminated it.    We will plan to continue the treatment as an outpatient.  She missed her dose of Reblozyl which was scheduled for 2/17.  We will need to reschedule this when she is discharged from the hospital.  2.  Anemia: She continues to be transfusion dependent.  Hemoglobin 2/17 was significantly low for her at 4.1.  Baseline is 7-8.  Anemia is likely multifactorial and due to underlying MDS, recent epistaxis, melena, and worsening chronic kidney disease.  Recommend PRBC transfusion to keep hemoglobin above seven.  Appreciate assistance from GI with further evaluation including EGD and capsule endoscopy.  3. Hypertension:  Management per hospitalist   4.  Aortic stenosis: Continues to follow with cardiology regarding this issue without any decompensated heart failure.  5.  Prognosis: Treatment for her MDS is palliative.  Her prognosis overall guarded given her multiple comorbid conditions.    LOS: 1 day   Mikey Bussing, DNP, AGPCNP-BC, AOCNP 09/01/20

## 2020-09-01 NOTE — Op Note (Signed)
[facility] Patient Name: Patricia Mata Procedure Date: 09/01/2020 MRN: [id number] Attending MD: Clarene Essex , MD Date of Birth: 05/18/1956 CSN: [id number] Age: 65 Admit Type: Inpatient Procedure:                Upper GI endoscopy Indications:              Iron deficiency anemia secondary to chronic blood                            loss Providers:                Clarene Essex, MD, Particia Nearing, RN, Cletis Athens,                            Technician Referring MD:              Medicines:                Propofol total dose 200 mg IV, 80 mg IV lidocaine Complications:            No immediate complications. Estimated Blood Loss:     Estimated blood loss: none. Procedure:                Pre-Anesthesia Assessment:                           - Prior to the procedure, a History and Physical                            was performed, and patient medications and                            allergies were reviewed. The patient's tolerance of                            previous anesthesia was also reviewed. The risks                            and benefits of the procedure and the sedation                            options and risks were discussed with the patient.                            All questions were answered, and informed consent                            was obtained. Prior Anticoagulants: The patient has                            taken no previous anticoagulant or antiplatelet                            agents. ASA Grade Assessment: III - A patient with  severe systemic disease. After reviewing the risks                            and benefits, the patient was deemed in                            satisfactory condition to undergo the procedure.                           After obtaining informed consent, the endoscope was                            passed under direct vision. Throughout the                            procedure, the patient's blood  pressure, pulse, and                            oxygen saturations were monitored continuously. The                            [id number]-[id number] ([id number]) was introduced through the                            mouth, and advanced to the third part of duodenum.                            The upper GI endoscopy was accomplished without                            difficulty. The patient tolerated the procedure                            well. Scope In: Scope Out: Findings:      The larynx was normal.      The examined esophagus was normal.      Localized mild inflammation characterized by congestion (edema) and       erythema was found in the gastric antrum.      Diffuse mucosal changes characterized by discoloration were found in the       duodenal bulb, in the first portion of the duodenum, in the second       portion of the duodenum and in the third portion of the duodenum. It       looked like looked like melanosis and may be just staining from oral iron      The exam was otherwise without abnormality. At the end of the procedure       we removed the endoscope and attached the capsule apparatus and       reinserted the scope and deployed the capsule in the duodenal bulb and       the scope was removed Impression:               - Normal larynx.                           - Normal  esophagus.                           - Chronic gastritis.                           - Mucosal changes in the duodenum.                           - The examination was otherwise normal.                           - No specimens collected. Moderate Sedation:      Not Applicable - Patient had care per Anesthesia. Recommendation:           - NPO for 2 hours.                           - Clear liquid diet x2-hour then advance diet as                            tolerated but Follow the instructions on the                            capsule endoscopy diet to slowly advance - Continue                            present  medications.                           - To visualize the small bowel, perform video                            capsule endoscopy today.                           - Return to GI clinic PRN.                           - Telephone GI clinic if symptomatic PRN. Procedure Code(s):        --- Professional ---                           [phone], Esophagogastroduodenoscopy, flexible,                            transoral; diagnostic, including collection of                            specimen(s) by brushing or washing, when performed                            (separate procedure) Diagnosis Code(s):        --- Professional ---                           K29.50, Unspecified chronic gastritis without  bleeding                           K31.89, Other diseases of stomach and duodenum                           D50.0, Iron deficiency anemia secondary to blood                            loss (chronic) CPT copyright 2019 [employer] Association. All rights reserved. The codes documented in this report are preliminary and upon coder review may  be revised to meet current compliance requirements. Clarene Essex, MD 09/01/2020 4:30:32 PM This report has been signed electronically. Number of Addenda: 0

## 2020-09-01 NOTE — Anesthesia Procedure Notes (Signed)
Procedure Name: MAC Date/Time: 09/01/2020 3:52 PM Performed by: Cynda Familia, CRNA Pre-anesthesia Checklist: Patient identified, Suction available, Timeout performed, Patient being monitored and Emergency Drugs available Patient Re-evaluated:Patient Re-evaluated prior to induction Oxygen Delivery Method: Simple face mask Placement Confirmation: positive ETCO2 and breath sounds checked- equal and bilateral Dental Injury: Teeth and Oropharynx as per pre-operative assessment

## 2020-09-01 NOTE — Progress Notes (Signed)
PROGRESS NOTE    Patricia Mata  TGY:[id number] DOB: June 24, 1956 DOA: 08/31/2020 PCP: Patricia Brine, FNP   Brief Narrative:  HPI: Patricia Mata is a 65 y.o. female with medical history significant of MDS, CKD V, DM2, Hx of colon CA, AS/MR, Chronic HFpEF. Presenting with weakness. She reports that for the last 2 days, she has found it hard to get around the house. She feels generally weak and worn out when she moves. Her husband has had to help her with standing and dressing. She reports she's had blurred vision and felt faint. She went to her regularly scheduled cancer appointment today and told them about her symptoms. They drew labs and found she was severely anemic. They recommended that she come to the ED.     Of note, she visited the ENT yesterday due to intermittent nose bleeds. When she was examined, she did not have an active bleed. It was recommended that she use nasal saline. She had not noted any further bleeds since.  Of note, she reports dark stools for 2 weeks that ended 2 days ago. She denies frank blood or hematemesis.   ED Course: 2 units pRBCs were ordered. TRH was called for admission.   Assessment & Plan:   Active Problems:   Symptomatic anemia   Symptomatic anemia/possible upper GI bleed/history of MDS: Patient with history of MDS and acute on chronic anemia comes in with weakness.  Per patient, her hemoglobin was 4 at oncology office but here in the ED it was 6.7.  FOBT x1 was positive as well.  She has a history of black stool for 2 weeks as well.  She was transfused 2 units of PRBC transfusion however repeat hemoglobin was once again 6.4 this morning.  Third unit of PRBC transfusion has been ordered.  Posttransfusion H&H will be checked.  Per GI recommendations, CT abdomen and pelvis was done which is unremarkable.  GI has been consulted and plan is for EGD tomorrow morning, if unrevealing then capsule endoscopy.  Appreciate GI help.  Oncology also to see  this patient today.  Continue PPI twice daily.  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  DM2: Hemoglobin A1c 6.0.  Continue Tradjenta and SSI.  Chronic HFpEF A fib PAH Aortic stenosis/Mitral Regurg     -She is euvolemic.  Continue home regimen which includes amiodarone 200 mg daily, Coreg 12.5 mg p.o. twice daily, clonidine 0.1 mg p.o. twice daily, Lasix 40 mg p.o. daily.  She is not on any anticoagulation for obvious reasons.  Hypothyroidism     - continue Synthroid  HLD     - continue atorvastatin    DVT prophylaxis: SCDs Start: 08/31/20 1810   Code Status: Full Code  Family Communication: None present at bedside.  Plan of care discussed with patient in length and he verbalized understanding and agreed with it.  Status is: Inpatient  Remains inpatient appropriate because:Ongoing diagnostic testing needed not appropriate for outpatient work up   Dispo: The patient is from: Home              Anticipated d/c is to: Home              Anticipated d/c date is: 1 day              Patient currently is not medically stable to d/c.   Difficult to place patient  No        Estimated body mass index is 35.95 kg/m as calculated from the following:   Height as of this encounter: 5\' 4"  (1.626 m).   Weight as of this encounter: 95 kg.      Nutritional status:               Consultants:   GI and oncology  Procedures:   None  Antimicrobials:  Anti-infectives (From admission, onward)   None         Subjective: Patient seen and examined.  She states that her weakness is much better and she feels much better.  She has no other complaint.  Objective: Vitals:   09/01/20 0500 09/01/20 0636 09/01/20 0827 09/01/20 1050  BP:  (!) 137/36 (!) 130/39 (!) 138/43  Pulse:  (!) 58 (!) 56 (!) 58  Resp:  20    Temp:  99.5 F (37.5 C) 98.2 F (36.8  C) 97.9 F (36.6 C)  TempSrc:  Oral Oral Oral  SpO2:  99% 100% 98%  Weight: 95 kg     Height:        Intake/Output Summary (Last 24 hours) at 09/01/2020 1109 Last data filed at 08/31/2020 2230 Gross per 24 hour  Intake 702.92 ml  Output -  Net 702.92 ml   Filed Weights   08/31/20 1219 09/01/20 0500  Weight: 90.7 kg 95 kg    Examination:  General exam: Appears calm and comfortable  Respiratory system: Clear to auscultation. Respiratory effort normal. Cardiovascular system: S1 & S2 heard, RRR. No JVD, murmurs, rubs, gallops or clicks. No pedal edema. Gastrointestinal system: Abdomen is nondistended, soft and nontender. No organomegaly or masses felt. Normal bowel sounds heard. Central nervous system: Alert and oriented. No focal neurological deficits. Extremities: Symmetric 5 x 5 power. Skin: No rashes, lesions or ulcers Psychiatry: Judgement and insight appear normal. Mood & affect appropriate.    Data Reviewed: I have personally reviewed following labs and imaging studies  CBC: Recent Labs  Lab 08/31/20 1051 09/01/20 0023 09/01/20 0402  WBC 5.4  --   --   NEUTROABS 4.0  --   --   HGB 4.1* 6.7* 6.4*  HCT 12.8* 20.1* 18.9*  MCV 94.1  --   --   PLT 125*  --   --    Basic Metabolic Panel: Recent Labs  Lab 08/31/20 1051 09/01/20 0023  NA 132* 135  K 4.8 4.1  CL 100 100  CO2 19* 21*  GLUCOSE 159* 228*  BUN 117* 102*  CREATININE 5.01* 4.85*  CALCIUM 9.0 9.6   GFR: Estimated Creatinine Clearance: 12.9 mL/min (A) (by C-G formula based on SCr of 4.85 mg/dL (H)). Liver Function Tests: Recent Labs  Lab 08/31/20 1051 09/01/20 0023  AST 62* 57*  ALT 35 34  ALKPHOS 86 78  BILITOT 1.2 1.4*  PROT 6.2* 6.6  ALBUMIN 3.3* 3.4*   No results for input(s): LIPASE, AMYLASE in the last 168 hours. No results for input(s): AMMONIA in the last 168 hours. Coagulation Profile: No results for input(s): INR, PROTIME in the last 168 hours. Cardiac Enzymes: No results for  input(s): CKTOTAL, CKMB, CKMBINDEX, TROPONINI in the last 168 hours. BNP (last 3 results) No results for input(s): PROBNP in the last 8760 hours. HbA1C: No results for input(s): HGBA1C in the last 72 hours. CBG: Recent Labs  Lab 08/31/20 2204 09/01/20 0753  GLUCAP 244* 151*   Lipid Profile: No results for input(s): CHOL, HDL, LDLCALC, TRIG,  CHOLHDL, LDLDIRECT in the last 72 hours. Thyroid Function Tests: No results for input(s): TSH, T4TOTAL, FREET4, T3FREE, THYROIDAB in the last 72 hours. Anemia Panel: Recent Labs    08/31/20 1644 08/31/20 1700  VITAMINB12  --  1,334*  FOLATE  --  37.0  FERRITIN  --  2,236*  TIBC  --  266  IRON  --  248*  RETICCTPCT 3.0  --    Sepsis Labs: No results for input(s): PROCALCITON, LATICACIDVEN in the last 168 hours.  Recent Results (from the past 240 hour(s))  SARS CORONAVIRUS 2 (TAT 6-24 HRS) Nasopharyngeal Nasopharyngeal Swab     Status: None   Collection Time: 08/31/20  6:11 PM   Specimen: Nasopharyngeal Swab  Result Value Ref Range Status   SARS Coronavirus 2 NEGATIVE NEGATIVE Final    Comment: (NOTE) SARS-CoV-2 target nucleic acids are NOT DETECTED.  The SARS-CoV-2 RNA is generally detectable in upper and lower respiratory specimens during the acute phase of infection. Negative results do not preclude SARS-CoV-2 infection, do not rule out co-infections with other pathogens, and should not be used as the sole basis for treatment or other patient management decisions. Negative results must be combined with clinical observations, patient history, and epidemiological information. The expected result is Negative.  Fact Sheet for Patients: SugarRoll.be  Fact Sheet for Healthcare Providers: https://www.woods-mathews.com/  This test is not yet approved or cleared by the [place] FDA and  has been authorized for detection and/or diagnosis of SARS-CoV-2 by FDA under an Emergency Use  Authorization (EUA). This EUA will remain  in effect (meaning this test can be used) for the duration of the COVID-19 declaration under Se ction 564(b)(1) of the Act, 21 U.S.C. section 360bbb-3(b)(1), unless the authorization is terminated or revoked sooner.  Performed at [facility], [place] [address], [place], [place] [address]       Radiology Studies: CT ABDOMEN PELVIS WO CONTRAST  Result Date: 08/31/2020 CLINICAL DATA:  Low hemoglobin anemia EXAM: CT ABDOMEN AND PELVIS WITHOUT CONTRAST TECHNIQUE: Multidetector CT imaging of the abdomen and pelvis was performed following the standard protocol without IV contrast. COMPARISON:  CT 06/08/2018, 05/08/2017 FINDINGS: Lower chest: Lung bases demonstrate no acute consolidation or effusion. There is cardiomegaly. Differential cardiac blood pool consistent with history of anemia. Hepatobiliary: No focal hepatic abnormality. No calcified gallstone or biliary dilatation. Pancreas: No inflammatory change or ductal dilatation Spleen: Normal in size without focal abnormality. Adrenals/Urinary Tract: Adrenal glands are normal. Kidneys show no hydronephrosis. The urinary bladder is unremarkable Stomach/Bowel: Stomach nonenlarged. No dilated small bowel. No acute bowel wall thickening. Scattered sigmoid colon diverticula. Vascular/Lymphatic: Extensive aortic atherosclerosis and calcified plaque including chronic subocclusive calcification within the infrarenal abdominal aorta. No suspicious nodes. Reproductive: Multiple calcified uterine masses consistent with fibroids. No adnexal mass. Other: Negative for free air or free fluid. Small fat containing umbilical hernia Musculoskeletal: No acute or significant osseous findings. IMPRESSION: 1. No CT evidence for acute intra-abdominal or pelvic abnormality. 2. Cardiomegaly. Differential cardiac blood pool likely corresponds to history of anemia. 3. Calcified uterine fibroids. Aortic Atherosclerosis (ICD10-I70.0).  Electronically Signed   By: Donavan Foil M.D.   On: 08/31/2020 19:24    Scheduled Meds: . sodium chloride  250 mL Intravenous Once  . acetaminophen  650 mg Oral Once  . allopurinol  300 mg Oral Daily  . amiodarone  200 mg Oral Daily  . atorvastatin  40 mg Oral Daily  . carvedilol  12.5 mg Oral BID  . cloNIDine  0.1 mg Oral BID  . diphenhydrAMINE  25 mg Oral Once  . furosemide  20 mg Intravenous Once  . furosemide  40 mg Oral Daily  . insulin aspart  0-15 Units Subcutaneous TID WC  . latanoprost  1 drop Both Eyes QHS  . levothyroxine  137 mcg Oral QAC breakfast  . linagliptin  5 mg Oral Daily  . loratadine  10 mg Oral Daily  . pantoprazole (PROTONIX) IV  40 mg Intravenous BID  . sildenafil  20 mg Oral TID  . sodium bicarbonate  650 mg Oral BID  . sodium chloride  1 spray Nasal TID   Continuous Infusions:   LOS: 1 day   Time spent: 35 minutes   Darliss Cheney, MD [facility]  09/01/2020, 11:09 AM   To contact the attending provider between 7A-7P or the covering provider during after hours 7P-7A, please log into the web site [email or web address].

## 2020-09-01 NOTE — Progress Notes (Signed)
   09/01/20 1724  Vitals  Temp 98.5 F (36.9 C)  Temp Source Oral  BP (!) 177/48  MAP (mmHg) 84  BP Location Left Arm  BP Method Automatic  Patient Position (if appropriate) Lying  Pulse Rate (!) 58  Pulse Rate Source Dinamap  ECG Heart Rate (!) 58  Resp 11  Level of Consciousness  Level of Consciousness Alert  MEWS COLOR  MEWS Score Color Green  Oxygen Therapy  SpO2 98 %  O2 Device Room Air  Pain Assessment  Pain Scale 0-10  Pain Score 0  Arrived from Endoscopy. Patient is alert. Blood pressure elevated . Made MD Ravi aware. PRN Hydralazine ordered. Per MD try PO medication first prior to giving IV medication. Will administer once patient can have PO intake.

## 2020-09-01 NOTE — Progress Notes (Signed)
Attempted to hang 1 unit of PRBC's. Received error on flowsheet, that no blood was available to be hung.  Blood sent back to blood bank.

## 2020-09-01 NOTE — Anesthesia Preprocedure Evaluation (Addendum)
Anesthesia Evaluation  Patient identified by MRN, date of birth, ID band Patient awake    Reviewed: Allergy & Precautions, NPO status , Patient's Chart, lab work & pertinent test results, reviewed documented beta blocker date and time   Airway Mallampati: II  TM Distance: >3 FB Neck ROM: Full    Dental  (+) Teeth Intact, Dental Advisory Given   Pulmonary former smoker,    Pulmonary exam normal breath sounds clear to auscultation       Cardiovascular hypertension, Pt. on medications and Pt. on home beta blockers (-) angina+CHF  (-) Past MI + dysrhythmias Atrial Fibrillation + Valvular Problems/Murmurs MR and AS  Rhythm:Regular Rate:Normal + Systolic murmurs    Neuro/Psych negative neurological ROS  negative psych ROS   GI/Hepatic Neg liver ROS, GERD  Medicated,Colon cancer    Endo/Other  diabetes, Type 2, Insulin Dependent, Oral Hypoglycemic AgentsHypothyroidism   Renal/GU Renal InsufficiencyRenal disease     Musculoskeletal negative musculoskeletal ROS (+)   Abdominal   Peds  Hematology  (+) Blood dyscrasia (Thrombocytopenia), anemia , myelodysplastic syndrome   Anesthesia Other Findings Day of surgery medications reviewed with the patient.  Reproductive/Obstetrics                            Anesthesia Physical Anesthesia Plan  ASA: IV  Anesthesia Plan: MAC   Post-op Pain Management:    Induction: Intravenous  PONV Risk Score and Plan: 2 and Propofol infusion and Treatment may vary due to age or medical condition  Airway Management Planned: Nasal Cannula and Natural Airway  Additional Equipment:   Intra-op Plan:   Post-operative Plan:   Informed Consent: I have reviewed the patients History and Physical, chart, labs and discussed the procedure including the risks, benefits and alternatives for the proposed anesthesia with the patient or authorized representative who has  indicated his/her understanding and acceptance.     Dental advisory given  Plan Discussed with: CRNA and Anesthesiologist  Anesthesia Plan Comments:         Anesthesia Quick Evaluation

## 2020-09-01 NOTE — Transfer of Care (Signed)
Immediate Anesthesia Transfer of Care Note  Patient: Patricia Mata  Procedure(s) Performed: ESOPHAGOGASTRODUODENOSCOPY (EGD) (N/A ) GIVENS CAPSULE STUDY (N/A )  Patient Location: PACU and Endoscopy Unit  Anesthesia Type:MAC  Level of Consciousness: awake and alert   Airway & Oxygen Therapy: Patient Spontanous Breathing and Patient connected to face mask oxygen  Post-op Assessment: Report given to RN and Post -op Vital signs reviewed and stable  Post vital signs: Reviewed and stable  Last Vitals:  Vitals Value Taken Time  BP 164/46 09/01/20 1621  Temp 37 C 09/01/20 1621  Pulse 55 09/01/20 1627  Resp 17 09/01/20 1627  SpO2 93 % 09/01/20 1627  Vitals shown include unvalidated device data.  Last Pain:  Vitals:   09/01/20 1621  TempSrc: Oral  PainSc: 0-No pain         Complications: No complications documented.

## 2020-09-01 NOTE — Consult Note (Signed)
Referring Provider: [facility] Primary Care Physician:  [name], FNP Primary Gastroenterologist:  Dr. Michail Sermon [facility])  Reason for Consultation:  Anemia, melena  HPI: Patricia Mata is a 65 y.o. female with history of MDS requiring chronic transfusions, colon cancer s/p right hemicolectomy in 2008, H-pylori positive gastritis (2017), CKD, HFpEF, and epistaxis presenting for consultation of anemia and melena.  Patient was found to have Hgb of 4.1 yesterday at cancer center, decreased from 6.6 approximately 3 weeks prior.  Patient reports dark stools for 2 weeks, as well as progressive weakness.  She felt presyncopal yesterday.  She denies any hematochezia or diarrhea.  States she typically has 1 stool per day but has not had a stool for 2 days.  Denies abdominal pain, nausea, vomiting, hematemesis, GERD, dysphagia, changes in appetite, and unexplained weight loss.  She denies chest pain or shortness of breath.    She denies ASA, NSAID, or blood thinner use.  EGD 05/2016: acute gastritis, H pylori positive per biopsy  Colonoscopy 08/2019: one tubular adenoma, 2 hyperplastic polyps, repeat 3 years  Past Medical History:  Diagnosis Date  . Acute renal failure (ARF) ([place]) 06/08/2018  . Cancer [facility]) 2008   Colon   . Chronic diastolic (congestive) heart failure ([place])   . Diabetes mellitus without complication ([place])   . Gout 09/08/2018  . Hypertension   . Macrocytic anemia   . MDS (myelodysplastic syndrome) ([place])   . Stage 4 chronic kidney disease [facility])     Past Surgical History:  Procedure Laterality Date  . COLON SURGERY    . DEBRIDMENT OF DECUBITUS ULCER N/A 06/12/2018   Procedure: DEBRIDMENT OF DECUBITUS ULCER;  Surgeon: Wallace Going, DO;  Location: [facility] ORS;  Service: Plastics;  Laterality: N/A;  . IR FLUORO GUIDE PORT INSERTION RIGHT  04/02/2017  . IR REMOVAL TUN ACCESS W/ PORT W/O FL MOD SED  03/16/2019  . IR US GUIDE VASC ACCESS RIGHT  04/02/2017  . RIGHT HEART CATH  N/A 04/23/2019   Procedure: RIGHT HEART CATH;  Surgeon: Jolaine Artist, MD;  Location: [place] CV LAB;  Service: Cardiovascular;  Laterality: N/A;  . RIGHT/LEFT HEART CATH AND CORONARY ANGIOGRAPHY N/A 05/21/2018   Procedure: RIGHT/LEFT HEART CATH AND CORONARY ANGIOGRAPHY;  Surgeon: Nelva Bush, MD;  Location: [place] CV LAB;  Service: Cardiovascular;  Laterality: N/A;    Prior to Admission medications   Medication Sig Start Date End Date Taking? Authorizing Provider  allopurinol (ZYLOPRIM) 300 MG tablet Take 300 mg by mouth daily. 07/05/20  Yes [provider]  amiodarone (PACERONE) 200 MG tablet Take 1 tablet (200 mg total) by mouth daily. 07/19/20  Yes Bensimhon, Shaune Pascal, MD  atorvastatin (LIPITOR) 40 MG tablet Take 1 tablet by mouth once daily Patient taking differently: Take 40 mg by mouth daily. 08/09/20  Yes [name], FNP  carvedilol (COREG) 12.5 MG tablet Take 1 tablet (12.5 mg total) by mouth 2 (two) times daily. 05/22/20 08/20/20 Yes Skeet Latch, MD  cloNIDine (CATAPRES) 0.1 MG tablet Take 1 tablet (0.1 mg total) by mouth 2 (two) times daily. 07/03/20  Yes Bensimhon, Shaune Pascal, MD  furosemide (LASIX) 40 MG tablet Take 1 tablet (40 mg total) by mouth daily. Take 80 mg in AM and 40 mg in PM (4PM) 07/09/20  Yes Rizwan, Eunice Blase, MD  hydrALAZINE (APRESOLINE) 100 MG tablet TAKE 1 TABLET BY MOUTH THREE TIMES DAILY Patient taking differently: Take 100 mg by mouth 3 (three) times daily. 08/29/20  Yes Gwenlyn Found,  Pearletha Forge, MD  Insulin Glargine (BASAGLAR KWIKPEN) 100 UNIT/ML Inject 0.14 mLs (14 Units total) into the skin daily. Patient taking differently: Inject 18 Units into the skin daily as needed (high blood sugar). Takes if blood sugar is over 150 02/03/20  Yes [name], FNP  latanoprost (XALATAN) 0.005 % ophthalmic solution Place 1 drop into both eyes at bedtime.    Yes [provider]  levothyroxine (SYNTHROID) 137 MCG tablet Take 1 tablet (137 mcg total)  by mouth daily before breakfast. 08/10/20  Yes Ghumman, Ramandeep, NP  linagliptin (TRADJENTA) 5 MG TABS tablet Take 1 tablet (5 mg total) by mouth daily. 02/03/20  Yes [name], FNP  loratadine (CLARITIN) 10 MG tablet Take 10 mg by mouth daily.   Yes [provider]  Multiple Vitamins-Minerals (CENTRUM SILVER 50+WOMEN) TABS Take 1 tablet by mouth daily.   Yes [provider]  pantoprazole (PROTONIX) 40 MG tablet Take 40 mg by mouth daily. 08/29/20  Yes [provider]  sildenafil (REVATIO) 20 MG tablet TAKE 1 TABLET BY MOUTH THREE TIMES DAILY Patient taking differently: Take 20 mg by mouth 3 (three) times daily. 08/09/20  Yes Bensimhon, Shaune Pascal, MD  sodium bicarbonate 650 MG tablet Take 650 mg by mouth 2 (two) times daily. 06/27/20  Yes [provider]  sodium chloride (OCEAN) 0.65 % nasal spray Place 1 spray into the nose in the morning, at noon, in the evening, and at bedtime.   Yes [provider]  blood glucose meter kit and supplies Dispense based on patient and insurance preference. Use up to four times daily as directed. (FOR ICD-10 E10.9, E11.9). 11/04/18   [name], FNP  Blood Glucose Monitoring Suppl [facility] Karsten Fells) w/Device KIT Check blood sugars twice daily E11.9 11/30/19   [name], FNP  fluticasone (FLONASE) 50 MCG/ACT nasal spray Place 1 spray into both nostrils daily. Patient not taking: No sig reported 01/28/20   Collene Gobble, MD  glucose blood test strip check blood sugars twice daily Dx code E11.22 03/02/20   [name], FNP  Insulin Pen Needle 29G X 5MM MISC Use as directed 06/16/17   Bonnielee Haff, MD  Lancets (ONETOUCH DELICA PLUS [id number]) [place] check blood sugars twice daily Dx code: E11.22 03/02/20   [name], FNP    Scheduled Meds: . sodium chloride  250 mL Intravenous Once  . acetaminophen  650 mg Oral Once  . allopurinol  300 mg Oral Daily  . amiodarone  200 mg Oral Daily  . atorvastatin  40 mg  Oral Daily  . carvedilol  12.5 mg Oral BID  . cloNIDine  0.1 mg Oral BID  . diphenhydrAMINE  25 mg Oral Once  . furosemide  20 mg Intravenous Once  . furosemide  40 mg Oral Daily  . insulin aspart  0-15 Units Subcutaneous TID WC  . latanoprost  1 drop Both Eyes QHS  . levothyroxine  137 mcg Oral QAC breakfast  . linagliptin  5 mg Oral Daily  . loratadine  10 mg Oral Daily  . pantoprazole (PROTONIX) IV  40 mg Intravenous BID  . sildenafil  20 mg Oral TID  . sodium bicarbonate  650 mg Oral BID  . sodium chloride  1 spray Nasal TID   Continuous Infusions: PRN Meds:.acetaminophen **OR** acetaminophen, ondansetron **OR** ondansetron (ZOFRAN) IV  Allergies as of 08/31/2020 - Review Complete 08/31/2020  Allergen Reaction Noted  . Ancef [cefazolin] Itching 04/02/2017  . Lactose intolerance (gi) Diarrhea 09/27/2019  .  Sulfa antibiotics Rash and Other (See Comments) 02/01/2016    Family History  Problem Relation Age of Onset  . Hypertension Mother   . Diabetes Mother   . Cervical cancer Mother   . Heart attack Father   . Hypertension Sister   . Hypertension Brother   . Hypertension Sister   . Hypertension Sister   . Prostate cancer Brother   . HIV/AIDS Brother     Social History   Socioeconomic History  . Marital status: Married    Spouse name: Not on file  . Number of children: Not on file  . Years of education: Not on file  . Highest education level: Not on file  Occupational History  . Occupation: retired  Tobacco Use  . Smoking status: Former Smoker    Packs/day: 0.25    Years: 20.00    Pack years: 5.00    Types: Cigarettes    Quit date: 01/31/2001    Years since quitting: 19.5  . Smokeless tobacco: Former Network engineer  . Vaping Use: Never used  Substance and Sexual Activity  . Alcohol use: Yes    Alcohol/week: 2.0 standard drinks    Types: 2 Shots of liquor per week    Comment: occ  . Drug use: Never  . Sexual activity: Not on file  Other Topics  Concern  . Not on file  Social History Narrative  . Not on file   Social Determinants of Health   Financial Resource Strain: Medium Risk  . Difficulty of Paying Living Expenses: Somewhat hard  Food Insecurity: Not on file  Transportation Needs: Not on file  Physical Activity: Not on file  Stress: Not on file  Social Connections: Not on file  Intimate Partner Violence: Not on file    Review of Systems: Review of Systems  Constitutional: Positive for malaise/fatigue. Negative for chills, fever and weight loss.  HENT: Negative for hearing loss and tinnitus.   Eyes: Negative for pain and redness.  Respiratory: Negative for cough and shortness of breath.   Cardiovascular: Negative for chest pain and palpitations.  Gastrointestinal: Positive for melena. Negative for abdominal pain, blood in stool, constipation, diarrhea, heartburn, nausea and vomiting.  Genitourinary: Negative for flank pain and hematuria.  Musculoskeletal: Negative for back pain and joint pain.  Skin: Negative for itching and rash.  Neurological: Positive for weakness. Negative for loss of consciousness.  Psychiatric/Behavioral: Negative for substance abuse. The patient is not nervous/anxious.      Physical Exam: Vital signs: Vitals:   09/01/20 0636 09/01/20 0827  BP: (!) 137/36 (!) 130/39  Pulse: (!) 58 (!) 56  Resp: 20   Temp: 99.5 F (37.5 C) 98.2 F (36.8 C)  SpO2: 99% 100%   Last BM Date: 08/30/20  Physical Exam Vitals reviewed.  Constitutional:      General: She is not in acute distress. HENT:     Head: Normocephalic and atraumatic.     Nose: Nose normal. No congestion.     Mouth/Throat:     Mouth: Mucous membranes are moist.     Pharynx: Oropharynx is clear.  Eyes:     Extraocular Movements: Extraocular movements intact.     Comments: Conjunctival pallor  Cardiovascular:     Rate and Rhythm: Regular rhythm. Bradycardia present.     Pulses: Normal pulses.  Pulmonary:     Effort:  Pulmonary effort is normal. No respiratory distress.     Breath sounds: Normal breath sounds.  Abdominal:  General: Bowel sounds are normal. There is no distension.     Palpations: Abdomen is soft. There is no mass.     Tenderness: There is no abdominal tenderness. There is no guarding or rebound.     Hernia: No hernia is present.  Musculoskeletal:        General: No swelling or tenderness.     Cervical back: Normal range of motion and neck supple.  Skin:    General: Skin is warm and dry.  Neurological:     General: No focal deficit present.     Mental Status: She is alert and oriented to person, place, and time.  Psychiatric:        Mood and Affect: Mood normal.        Behavior: Behavior normal. Behavior is cooperative.     GI:  Lab Results: Recent Labs    08/31/20 1051 09/01/20 0023 09/01/20 0402  WBC 5.4  --   --   HGB 4.1* 6.7* 6.4*  HCT 12.8* 20.1* 18.9*  PLT 125*  --   --    BMET Recent Labs    08/31/20 1051 09/01/20 0023  NA 132* 135  K 4.8 4.1  CL 100 100  CO2 19* 21*  GLUCOSE 159* 228*  BUN 117* 102*  CREATININE 5.01* 4.85*  CALCIUM 9.0 9.6   LFT Recent Labs    09/01/20 0023  PROT 6.6  ALBUMIN 3.4*  AST 57*  ALT 34  ALKPHOS 78  BILITOT 1.4*   PT/INR No results for input(s): LABPROT, INR in the last 72 hours.   Studies/Results: CT ABDOMEN PELVIS WO CONTRAST  Result Date: 08/31/2020 CLINICAL DATA:  Low hemoglobin anemia EXAM: CT ABDOMEN AND PELVIS WITHOUT CONTRAST TECHNIQUE: Multidetector CT imaging of the abdomen and pelvis was performed following the standard protocol without IV contrast. COMPARISON:  CT 06/08/2018, 05/08/2017 FINDINGS: Lower chest: Lung bases demonstrate no acute consolidation or effusion. There is cardiomegaly. Differential cardiac blood pool consistent with history of anemia. Hepatobiliary: No focal hepatic abnormality. No calcified gallstone or biliary dilatation. Pancreas: No inflammatory change or ductal dilatation  Spleen: Normal in size without focal abnormality. Adrenals/Urinary Tract: Adrenal glands are normal. Kidneys show no hydronephrosis. The urinary bladder is unremarkable Stomach/Bowel: Stomach nonenlarged. No dilated small bowel. No acute bowel wall thickening. Scattered sigmoid colon diverticula. Vascular/Lymphatic: Extensive aortic atherosclerosis and calcified plaque including chronic subocclusive calcification within the infrarenal abdominal aorta. No suspicious nodes. Reproductive: Multiple calcified uterine masses consistent with fibroids. No adnexal mass. Other: Negative for free air or free fluid. Small fat containing umbilical hernia Musculoskeletal: No acute or significant osseous findings. IMPRESSION: 1. No CT evidence for acute intra-abdominal or pelvic abnormality. 2. Cardiomegaly. Differential cardiac blood pool likely corresponds to history of anemia. 3. Calcified uterine fibroids. Aortic Atherosclerosis (ICD10-I70.0). Electronically Signed   By: Donavan Foil M.D.   On: 08/31/2020 19:24    Impression: Anemia and melena; history of H pylori gastritis -Hgb of 4.1 yesterday, decreased from 6.6 ~3 weeks prior -Hgb 6.4 today s/p 2u pRBCs  Colon cancer s/p right hemicolectomy in 2008, last colonoscopy 08/2019  CKD: BUN 102/ Cr 4.85, at baseline  Myelodysplastic syndrome, followed by heme/onc, receives chronic transfusions  Plan: EGD today, and if negative, capsule endoscopy.  I thoroughly discussed the procedures with the patient to include nature, alternatives, benefits, and risks (including but not limited to bleeding, infection, perforation, anesthesia/cardiac and pulmonary complications).  Patient verbalized understanding and gave verbal consent to proceed with EGD as well  as capsule endoscopy.  Continue Protonix 40 mg IV twice daily.  NPO until post procedure.  If capsule endoscopy is completed, follow diet instructions post capsule.  Continue to monitor H&H with transfusion as  needed to maintain hemoglobin ~7.  [facility] will follow.   LOS: 1 day   Salley Slaughter  PA-C 09/01/2020, 9:38 AM  Contact #  [phone]

## 2020-09-01 NOTE — Progress Notes (Signed)
Patient to endoscopy via wheelchair. 1 unit of PRBC's going per blood administration flow-sheet. Will re-assume care when patient returns.

## 2020-09-01 NOTE — Progress Notes (Signed)
Shift Summary:    Remained alert and oriented x4 . Remained on room air. Was able to ambulate to BSC/bathroom with no assistance. Denied dizziness/sob during this shift.  Transfused 1 Unit of PRBC's during this shift. Went down for endoscopy during this shift.  Per Endoscopy:  Clear Liquid diet to be started @1810   2200 pm- Light snack  0000 pm- Regular diet 0400 am- Belt can come off  Blood pressure noted to be elevate during this shift, no PRNs available.Will reach out to MD for available medicines. No other needs identified. Will continue to monitor.   POC: Recheck H/H.Monitor for signs and symptoms of bleeding, advance diet per endoscopy.

## 2020-09-01 NOTE — Plan of Care (Signed)
Critical lab value:  Hgb: 6.7  Notified: Jonny Ruiz NP @ 12:42 AM 09/01/2020

## 2020-09-02 ENCOUNTER — Encounter: Payer: Self-pay | Admitting: Cardiovascular Disease

## 2020-09-02 DIAGNOSIS — K2950 Unspecified chronic gastritis without bleeding: Secondary | ICD-10-CM | POA: Diagnosis present

## 2020-09-02 DIAGNOSIS — D649 Anemia, unspecified: Secondary | ICD-10-CM | POA: Diagnosis not present

## 2020-09-02 LAB — CBC
HCT: 22.3 % — ABNORMAL LOW (ref 36.0–46.0)
Hemoglobin: 7.7 g/dL — ABNORMAL LOW (ref 12.0–15.0)
MCH: 31.4 pg (ref 26.0–34.0)
MCHC: 34.5 g/dL (ref 30.0–36.0)
MCV: 91 fL (ref 80.0–100.0)
Platelets: 153 10*3/uL (ref 150–400)
RBC: 2.45 MIL/uL — ABNORMAL LOW (ref 3.87–5.11)
RDW: 18.3 % — ABNORMAL HIGH (ref 11.5–15.5)
WBC: 5.9 10*3/uL (ref 4.0–10.5)
nRBC: 1.3 % — ABNORMAL HIGH (ref 0.0–0.2)

## 2020-09-02 LAB — GLUCOSE, CAPILLARY
Glucose-Capillary: 144 mg/dL — ABNORMAL HIGH (ref 70–99)
Glucose-Capillary: 175 mg/dL — ABNORMAL HIGH (ref 70–99)

## 2020-09-02 LAB — BASIC METABOLIC PANEL
Anion gap: 11 (ref 5–15)
BUN: 108 mg/dL — ABNORMAL HIGH (ref 8–23)
CO2: 23 mmol/L (ref 22–32)
Calcium: 9.5 mg/dL (ref 8.9–10.3)
Chloride: 99 mmol/L (ref 98–111)
Creatinine, Ser: 4.2 mg/dL — ABNORMAL HIGH (ref 0.44–1.00)
GFR, Estimated: 11 mL/min — ABNORMAL LOW (ref >60)
Glucose, Bld: 147 mg/dL — ABNORMAL HIGH (ref 70–99)
Potassium: 3.6 mmol/L (ref 3.5–5.1)
Sodium: 133 mmol/L — ABNORMAL LOW (ref 135–145)

## 2020-09-02 LAB — MAGNESIUM: Magnesium: 1.9 mg/dL (ref 1.7–2.4)

## 2020-09-02 MED ORDER — OMEPRAZOLE 40 MG PO CPDR: 40.0000 mg | DELAYED_RELEASE_CAPSULE | Freq: Two times a day (BID) | ORAL | 0 refills | Status: DC

## 2020-09-02 MED ORDER — CARVEDILOL 6.25 MG PO TABS: 6.1250 mg | ORAL_TABLET | Freq: Two times a day (BID) | ORAL | 0 refills | Status: DC

## 2020-09-02 MED ORDER — OMEPRAZOLE 40 MG PO CPDR: 40.0000 mg | DELAYED_RELEASE_CAPSULE | Freq: Two times a day (BID) | ORAL | 1 refills | Status: DC

## 2020-09-02 NOTE — Discharge Instructions (Signed)
[employer] medicine (25th ed., pp. [phone]). [place], [place]: Elsevier.">  Anemia  Anemia is a condition in which there is not enough red blood cells or hemoglobin in the blood. Hemoglobin is a substance in red blood cells that carries oxygen. When you do not have enough red blood cells or hemoglobin (are anemic), your body cannot get enough oxygen and your organs may not work properly. As a result, you may feel very tired or have other problems. What are the causes? Common causes of anemia include:  Excessive bleeding. Anemia can be caused by excessive bleeding inside or outside the body, including bleeding from the intestines or from heavy menstrual periods in females.  Poor nutrition.  Long-lasting (chronic) kidney, thyroid, and liver disease.  Bone marrow disorders, spleen problems, and blood disorders.  Cancer and treatments for cancer.  HIV (human immunodeficiency virus) and AIDS (acquired immunodeficiency syndrome).  Infections, medicines, and autoimmune disorders that destroy red blood cells. What are the signs or symptoms? Symptoms of this condition include:  Minor weakness.  Dizziness.  Headache, or difficulties concentrating and sleeping.  Heartbeats that feel irregular or faster than normal (palpitations).  Shortness of breath, especially with exercise.  Pale skin, lips, and nails, or cold hands and feet.  Indigestion and nausea. Symptoms may occur suddenly or develop slowly. If your anemia is mild, you may not have symptoms. How is this diagnosed? This condition is diagnosed based on blood tests, your medical history, and a physical exam. In some cases, a test may be needed in which cells are removed from the soft tissue inside of a bone and looked at under a microscope (bone marrow biopsy). Your health care provider may also check your stool (feces) for blood and may do additional testing to look for the cause of your bleeding. Other tests may  include:  Imaging tests, such as a CT scan or MRI.  A procedure to see inside your esophagus and stomach (endoscopy).  A procedure to see inside your colon and rectum (colonoscopy). How is this treated? Treatment for this condition depends on the cause. If you continue to lose a lot of blood, you may need to be treated at a hospital. Treatment may include:  Taking supplements of iron, vitamin Q68, or folic acid.  Taking a hormone medicine (erythropoietin) that can help to stimulate red blood cell growth.  Having a blood transfusion. This may be needed if you lose a lot of blood.  Making changes to your diet.  Having surgery to remove your spleen. Follow these instructions at home:  Take over-the-counter and prescription medicines only as told by your health care provider.  Take supplements only as told by your health care provider.  Follow any diet instructions that you were given by your health care provider.  Keep all follow-up visits as told by your health care provider. This is important. Contact a health care provider if:  You develop new bleeding anywhere in the body. Get help right away if:  You are very weak.  You are short of breath.  You have pain in your abdomen or chest.  You are dizzy or feel faint.  You have trouble concentrating.  You have bloody stools, black stools, or tarry stools.  You vomit repeatedly or you vomit up blood. These symptoms may represent a serious problem that is an emergency. Do not wait to see if the symptoms will go away. Get medical help right away. Call your local emergency services (911 in the [place] Do not  drive yourself to the hospital. Summary  Anemia is a condition in which you do not have enough red blood cells or enough of a substance in your red blood cells that carries oxygen (hemoglobin).  Symptoms may occur suddenly or develop slowly.  If your anemia is mild, you may not have symptoms.  This condition is  diagnosed with blood tests, a medical history, and a physical exam. Other tests may be needed.  Treatment for this condition depends on the cause of the anemia. This information is not intended to replace advice given to you by your health care provider. Make sure you discuss any questions you have with your health care provider. Document Revised: 06/08/2019 Document Reviewed: 06/08/2019 Elsevier Patient Education  2021 [employer] Inc.  

## 2020-09-02 NOTE — Progress Notes (Signed)
Pt discharged to home, instructions reviewed with pt. Pt acknowledged understanding of instructions. Pt is to follow up with Cardiology concerning medication review related to low pulse rate and oncologist/hematalogist concerning medication post transfusion. Pt understand and acknowledges she will call and make the appropriate appointment. SRP, RN

## 2020-09-02 NOTE — Discharge Summary (Addendum)
Physician Discharge Summary  Basha Krygier DHR:[id number] DOB: 05-25-1956 DOA: 08/31/2020  PCP: [name], FNP  Admit date: 08/31/2020 Discharge date: 09/02/2020 30 Day Unplanned Readmission Risk Score   Flowsheet Row ED to Hosp-Admission (Current) from 08/31/2020 in [facility]  30 Day Unplanned Readmission Risk Score (%) 38.79 Filed at 09/02/2020 0800     This score is the patient's risk of an unplanned readmission within 30 days of being discharged (0 -100%). The score is based on dignosis, age, lab data, medications, orders, and past utilization.   Low:  0-14.9   Medium: 15-21.9   High: 22-29.9   Extreme: 30 and above         Admitted From: Home Disposition: Home  Recommendations for Outpatient Follow-up:  1. Follow up with PCP in 1-2 weeks 2. Follow-up with GI for capsule endoscopy results 3. Follow-up with your routine oncologist on a scheduled date 4. Please obtain BMP/CBC in one week 5. Please follow up with your PCP on the following pending results: Unresulted Labs (From admission, onward)          Start     Ordered   09/02/20 0500  CBC  Daily,   R     Question:  Specimen collection method  Answer:  Lab=Lab collect   09/01/20 1116   09/02/20 4680  Basic metabolic panel  Daily,   R     Question:  Specimen collection method  Answer:  Lab=Lab collect   09/01/20 1116            Home Health: None Equipment/Devices: None  Discharge Condition: Stable CODE STATUS: Full code Diet recommendation: Cardiac  Subjective: Seen and examined.  No complaints.  Ready to go home.  Brief/Interim Summary: Patricia Saintvil Holmesis a 65 y.o.femalewith medical history significant ofMDS, CKD V, DM2, Hx of colon CA, AS/MR, Chronic HFpEF. Presented with weakness for 2 days. She reports she's had blurred vision and felt faint. She went to her regularly scheduled cancer appointment and reportedly her hemoglobin was 4.1 and she was referred  to ED.  Of note, she visited the ENT day before admission due to intermittent nose bleeds. When she was examined, she did not have an active bleed. It was recommended that she use nasal saline. She had not noted any further bleeds since.  Of note, she reports dark stools for 2 weeks that ended 2 days ago. She denies frank blood or hematemesis. Upon arrival to ED, she was hemodynamically stable however her hemoglobin was found to be 6.4.  FOBT x1 was positive as well.  Patient was initially given 2 units of PRBC transfusion but her hemoglobin did not improve much so she received third unit.  GI was consulted as well as oncology was consulted.  Patient underwent EGD and push enteroscopy and was found to have chronic gastritis but per GI that did not seem to be the source of GI bleed so patient ended up having capsule endoscopy.  Patient's hemoglobin currently is 7.7 which has remained stable since last 24 hours after her third unit of transfusion.  She has been cleared by oncology and they have recommended that she follows up with them as outpatient as scheduled.  She has been cleared by GI as well.  GI will call her with results of capsule endoscopy.  Patient is feeling much better as far as her symptoms goes and she is agreeable with the plan of discharge.  Rest of her medical issues  remained stable.  Discharge Diagnoses:  Active Problems:   Symptomatic anemia   Chronic gastritis    Discharge Instructions   Allergies as of 09/02/2020      Reactions   Ancef [cefazolin] Itching   Severe itching- after procedure, ancef was the antibiotic.-04/02/17 Tolerates penicillins   Lactose Intolerance (gi) Diarrhea   Sulfa Antibiotics Rash, Other (See Comments)   Blisters, also      Medication List    STOP taking these medications   pantoprazole 40 MG tablet Commonly known as: PROTONIX     TAKE these medications   allopurinol 300 MG tablet Commonly known as: ZYLOPRIM Take 300 mg by mouth  daily.   amiodarone 200 MG tablet Commonly known as: PACERONE Take 1 tablet (200 mg total) by mouth daily.   atorvastatin 40 MG tablet Commonly known as: LIPITOR Take 1 tablet by mouth once daily   Basaglar KwikPen 100 UNIT/ML Inject 0.14 mLs (14 Units total) into the skin daily. What changed:   how much to take  when to take this  reasons to take this  additional instructions   blood glucose meter kit and supplies Dispense based on patient and insurance preference. Use up to four times daily as directed. (FOR ICD-10 E10.9, E11.9).   carvedilol 12.5 MG tablet Commonly known as: COREG Take 1 tablet (12.5 mg total) by mouth 2 (two) times daily. What changed: Another medication with the same name was added. Make sure you understand how and when to take each.   carvedilol 6.25 MG tablet Commonly known as: COREG Take 1 tablet (6.25 mg total) by mouth 2 (two) times daily. What changed: You were already taking a medication with the same name, and this prescription was added. Make sure you understand how and when to take each.   Centrum Silver 50+Women Tabs Take 1 tablet by mouth daily.   cloNIDine 0.1 MG tablet Commonly known as: Catapres Take 1 tablet (0.1 mg total) by mouth 2 (two) times daily.   fluticasone 50 MCG/ACT nasal spray Commonly known as: FLONASE Place 1 spray into both nostrils daily.   furosemide 40 MG tablet Commonly known as: LASIX Take 1 tablet (40 mg total) by mouth daily. Take 80 mg in AM and 40 mg in PM (4PM)   glucose blood test strip check blood sugars twice daily Dx code E11.22   hydrALAZINE 100 MG tablet Commonly known as: APRESOLINE TAKE 1 TABLET BY MOUTH THREE TIMES DAILY   Insulin Pen Needle 29G X 5MM Misc Use as directed   latanoprost 0.005 % ophthalmic solution Commonly known as: XALATAN Place 1 drop into both eyes at bedtime.   levothyroxine 137 MCG tablet Commonly known as: SYNTHROID Take 1 tablet (137 mcg total) by mouth  daily before breakfast.   linagliptin 5 MG Tabs tablet Commonly known as: Tradjenta Take 1 tablet (5 mg total) by mouth daily.   loratadine 10 MG tablet Commonly known as: CLARITIN Take 10 mg by mouth daily.   omeprazole 40 MG capsule Commonly known as: PRILOSEC Take 1 capsule (40 mg total) by mouth 2 (two) times daily before a meal.   OneTouch Delica Plus [id number] Misc check blood sugars twice daily Dx code: E11.22   OneTouch UltraLink w/Device Kit Check blood sugars twice daily E11.9   sildenafil 20 MG tablet Commonly known as: REVATIO TAKE 1 TABLET BY MOUTH THREE TIMES DAILY   sodium bicarbonate 650 MG tablet Take 650 mg by mouth 2 (two) times daily.   sodium chloride  0.65 % nasal spray Commonly known as: OCEAN Place 1 spray into the nose in the morning, at noon, in the evening, and at bedtime.       Follow-up Information    [name], FNP Follow up in 1 week(s).   Specialty: General Practice Contact information: [address] [place] [address] [phone]        Lorretta Harp, MD .   Specialties: Cardiology, Radiology Contact information: [address] [place] [address] [phone]              Allergies  Allergen Reactions  . Ancef [Cefazolin] Itching    Severe itching- after procedure, ancef was the antibiotic.-04/02/17 Tolerates penicillins  . Lactose Intolerance (Gi) Diarrhea  . Sulfa Antibiotics Rash and Other (See Comments)    Blisters, also    Consultations: Oncology and GI   Procedures/Studies: CT ABDOMEN PELVIS WO CONTRAST  Result Date: 08/31/2020 CLINICAL DATA:  Low hemoglobin anemia EXAM: CT ABDOMEN AND PELVIS WITHOUT CONTRAST TECHNIQUE: Multidetector CT imaging of the abdomen and pelvis was performed following the standard protocol without IV contrast. COMPARISON:  CT 06/08/2018, 05/08/2017 FINDINGS: Lower chest: Lung bases demonstrate no acute consolidation or effusion. There is  cardiomegaly. Differential cardiac blood pool consistent with history of anemia. Hepatobiliary: No focal hepatic abnormality. No calcified gallstone or biliary dilatation. Pancreas: No inflammatory change or ductal dilatation Spleen: Normal in size without focal abnormality. Adrenals/Urinary Tract: Adrenal glands are normal. Kidneys show no hydronephrosis. The urinary bladder is unremarkable Stomach/Bowel: Stomach nonenlarged. No dilated small bowel. No acute bowel wall thickening. Scattered sigmoid colon diverticula. Vascular/Lymphatic: Extensive aortic atherosclerosis and calcified plaque including chronic subocclusive calcification within the infrarenal abdominal aorta. No suspicious nodes. Reproductive: Multiple calcified uterine masses consistent with fibroids. No adnexal mass. Other: Negative for free air or free fluid. Small fat containing umbilical hernia Musculoskeletal: No acute or significant osseous findings. IMPRESSION: 1. No CT evidence for acute intra-abdominal or pelvic abnormality. 2. Cardiomegaly. Differential cardiac blood pool likely corresponds to history of anemia. 3. Calcified uterine fibroids. Aortic Atherosclerosis (ICD10-I70.0). Electronically Signed   By: Donavan Foil M.D.   On: 08/31/2020 19:24     Discharge Exam: Vitals:   09/02/20 0442 09/02/20 1309  BP: (!) 150/44 (!) 96/47  Pulse: (!) 53 92  Resp: 18   Temp: 98.1 F (36.7 C) 99 F (37.2 C)  SpO2: 99% 96%   Vitals:   09/01/20 2150 09/02/20 0442 09/02/20 0443 09/02/20 1309  BP: (!) 165/38 (!) 150/44  (!) 96/47  Pulse: (!) 57 (!) 53  92  Resp: 16 18    Temp: 98.9 F (37.2 C) 98.1 F (36.7 C)  99 F (37.2 C)  TempSrc: Oral Oral  Oral  SpO2: 100% 99%  96%  Weight:   93.5 kg   Height:        General: Pt is alert, awake, not in acute distress Cardiovascular: RRR, S1/S2 +, no rubs, no gallops Respiratory: CTA bilaterally, no wheezing, no rhonchi Abdominal: Soft, NT, ND, bowel sounds + Extremities: no edema,  no cyanosis    The results of significant diagnostics from this hospitalization (including imaging, microbiology, ancillary and laboratory) are listed below for reference.     Microbiology: Recent Results (from the past 240 hour(s))  SARS CORONAVIRUS 2 (TAT 6-24 HRS) Nasopharyngeal Nasopharyngeal Swab     Status: None   Collection Time: 08/31/20  6:11 PM   Specimen: Nasopharyngeal Swab  Result Value Ref Range Status  SARS Coronavirus 2 NEGATIVE NEGATIVE Final    Comment: (NOTE) SARS-CoV-2 target nucleic acids are NOT DETECTED.  The SARS-CoV-2 RNA is generally detectable in upper and lower respiratory specimens during the acute phase of infection. Negative results do not preclude SARS-CoV-2 infection, do not rule out co-infections with other pathogens, and should not be used as the sole basis for treatment or other patient management decisions. Negative results must be combined with clinical observations, patient history, and epidemiological information. The expected result is Negative.  Fact Sheet for Patients: SugarRoll.be  Fact Sheet for Healthcare Providers: https://www.woods-mathews.com/  This test is not yet approved or cleared by the [place] FDA and  has been authorized for detection and/or diagnosis of SARS-CoV-2 by FDA under an Emergency Use Authorization (EUA). This EUA will remain  in effect (meaning this test can be used) for the duration of the COVID-19 declaration under Se ction 564(b)(1) of the Act, 21 U.S.C. section 360bbb-3(b)(1), unless the authorization is terminated or revoked sooner.  Performed at [facility], [place] [address] [place], [place] [address]      Labs: BNP (last 3 results) Recent Labs    09/20/19 1823 06/07/20 0826 07/06/20 1733  BNP 548.2* 489.2* 025.4*   Basic Metabolic Panel: Recent Labs  Lab 08/31/20 1051 09/01/20 0023 09/02/20 0130  NA 132* 135 133*  K 4.8 4.1 3.6   CL 100 100 99  CO2 19* 21* 23  GLUCOSE 159* 228* 147*  BUN 117* 102* 108*  CREATININE 5.01* 4.85* 4.20*  CALCIUM 9.0 9.6 9.5  MG  --   --  1.9   Liver Function Tests: Recent Labs  Lab 08/31/20 1051 09/01/20 0023  AST 62* 57*  ALT 35 34  ALKPHOS 86 78  BILITOT 1.2 1.4*  PROT 6.2* 6.6  ALBUMIN 3.3* 3.4*   No results for input(s): LIPASE, AMYLASE in the last 168 hours. No results for input(s): AMMONIA in the last 168 hours. CBC: Recent Labs  Lab 08/31/20 1051 09/01/20 0023 09/01/20 0402 09/01/20 2000 09/02/20 0130  WBC 5.4  --   --   --  5.9  NEUTROABS 4.0  --   --   --   --   HGB 4.1* 6.7* 6.4* 8.0* 7.7*  HCT 12.8* 20.1* 18.9* 23.3* 22.3*  MCV 94.1  --   --   --  91.0  PLT 125*  --   --   --  153   Cardiac Enzymes: No results for input(s): CKTOTAL, CKMB, CKMBINDEX, TROPONINI in the last 168 hours. BNP: Invalid input(s): POCBNP CBG: Recent Labs  Lab 09/01/20 1115 09/01/20 1817 09/01/20 2146 09/02/20 0734 09/02/20 1149  GLUCAP 104* 129* 188* 144* 175*   D-Dimer No results for input(s): DDIMER in the last 72 hours. Hgb A1c No results for input(s): HGBA1C in the last 72 hours. Lipid Profile No results for input(s): CHOL, HDL, LDLCALC, TRIG, CHOLHDL, LDLDIRECT in the last 72 hours. Thyroid function studies No results for input(s): TSH, T4TOTAL, T3FREE, THYROIDAB in the last 72 hours.  Invalid input(s): FREET3 Anemia work up Recent Labs    08/31/20 1644 08/31/20 1700  VITAMINB12  --  1,334*  FOLATE  --  37.0  FERRITIN  --  2,236*  TIBC  --  266  IRON  --  248*  RETICCTPCT 3.0  --    Urinalysis    Component Value Date/Time   COLORURINE YELLOW 07/06/2020 1706   APPEARANCEUR HAZY (A) 07/06/2020 1706   LABSPEC 1.008 07/06/2020 1706  PHURINE 5.0 07/06/2020 1706   GLUCOSEU NEGATIVE 07/06/2020 1706   HGBUR NEGATIVE 07/06/2020 1706   BILIRUBINUR NEGATIVE 07/06/2020 1706   BILIRUBINUR negative 05/11/2020 1152   KETONESUR NEGATIVE 07/06/2020 1706    PROTEINUR 100 (A) 07/06/2020 1706   UROBILINOGEN 0.2 05/11/2020 1152   NITRITE NEGATIVE 07/06/2020 1706   LEUKOCYTESUR SMALL (A) 07/06/2020 1706   Sepsis Labs Invalid input(s): PROCALCITONIN,  WBC,  LACTICIDVEN Microbiology Recent Results (from the past 240 hour(s))  SARS CORONAVIRUS 2 (TAT 6-24 HRS) Nasopharyngeal Nasopharyngeal Swab     Status: None   Collection Time: 08/31/20  6:11 PM   Specimen: Nasopharyngeal Swab  Result Value Ref Range Status   SARS Coronavirus 2 NEGATIVE NEGATIVE Final    Comment: (NOTE) SARS-CoV-2 target nucleic acids are NOT DETECTED.  The SARS-CoV-2 RNA is generally detectable in upper and lower respiratory specimens during the acute phase of infection. Negative results do not preclude SARS-CoV-2 infection, do not rule out co-infections with other pathogens, and should not be used as the sole basis for treatment or other patient management decisions. Negative results must be combined with clinical observations, patient history, and epidemiological information. The expected result is Negative.  Fact Sheet for Patients: SugarRoll.be  Fact Sheet for Healthcare Providers: https://www.woods-mathews.com/  This test is not yet approved or cleared by the [place] FDA and  has been authorized for detection and/or diagnosis of SARS-CoV-2 by FDA under an Emergency Use Authorization (EUA). This EUA will remain  in effect (meaning this test can be used) for the duration of the COVID-19 declaration under Se ction 564(b)(1) of the Act, 21 U.S.C. section 360bbb-3(b)(1), unless the authorization is terminated or revoked sooner.  Performed at [facility], [place] [address], [place], [place] [address]      Time coordinating discharge: Over 30 minutes  SIGNED:   Darliss Cheney, MD  [facility] 09/02/2020, 1:27 PM  If 7PM-7AM, please contact night-coverage [email or web address]

## 2020-09-02 NOTE — Plan of Care (Signed)

## 2020-09-02 NOTE — Progress Notes (Signed)
Patricia Mata 8:52 AM  Subjective: Patient without any complaints and no signs of bleeding and tolerated her procedures yesterday and we again discussed capsule endoscopy  Objective: Vital signs stable afebrile no acute distress abdomen is soft nontender hemoglobin essentially the same BUN slight increase creatinine slight decrease  Assessment: Chronic anemia probably multifactorial  Plan: Okay with me to go home from a GI standpoint and I will look at her capsule endoscopy either Saturday or Sunday depending on when it is available on the computer and will call her with the results to decide further work-up plans and follow-up and I have instructed her to call me if she has not heard from me or my nurse by Monday afternoon  [facility] E  office [phone] After 5PM or if no answer call [phone]

## 2020-09-02 NOTE — Progress Notes (Signed)
Reviewed d/c instructions with husband. SRP, RN

## 2020-09-03 NOTE — Progress Notes (Signed)
PROCEDURE NOTE  08/31/2020 - 09/01/2020  12:42 PM  PATIENT:  Patricia Mata  65 y.o. female  PRE-OPERATIVE DIAGNOSIS: Anemia episodic guaiac positivity  POST-OPERATIVE DIAGNOSIS: Same  PROCEDURE: Capsule endoscopy  SURGEON:  Surgeon(s): Clarene Essex, MD  ASSESSMENT/FINDINGS: No signs of bleeding please see official report for details  PLAN OF CARE: She will call us as needed otherwise follow-up with her primary gastroenterologist Dr. Alessandra Bevels in 1 month to make sure no further work-up or plans are needed and decide if she should proceed with her follow-up colonoscopy sooner than previously decided or just follow H&H and serial guaiacs

## 2020-09-04 ENCOUNTER — Telehealth: Payer: Self-pay

## 2020-09-04 NOTE — Telephone Encounter (Signed)
Transition Care Management Follow-up Telephone Call  Date of discharge and from where: 09/03/2019 [facility]  How have you been since you were released from the hospital? good  Any questions or concerns? No  Items Reviewed:  Did the pt receive and understand the discharge instructions provided? Yes   Medications obtained and verified? Yes   Other? No   Any new allergies since your discharge? No   Dietary orders reviewed? Yes  Do you have support at home? Yes   Home Care and Equipment/Supplies: Were home health services ordered? not applicable If so, what is the name of the agency? n/a  Has the agency set up a time to come to the patient's home? not applicable Were any new equipment or medical supplies ordered?  No What is the name of the medical supply agency? n/a Were you able to get the supplies/equipment? not applicable Do you have any questions related to the use of the equipment or supplies? No  Functional Questionnaire: (I = Independent and D = Dependent) ADLs: I  Bathing/Dressing- I  Meal Prep- I  Eating- I  Maintaining continence- I  Transferring/Ambulation- I  Managing Meds- I  Follow up appointments reviewed:   PCP Hospital f/u appt confirmed? Yes  Scheduled to see Patricia Brine FNP on 09/05/2020 @ 12:00.  Are transportation arrangements needed? No   If their condition worsens, is the pt aware to call PCP or go to the Emergency Dept.? Yes  Was the patient provided with contact information for the PCP's office or ED? Yes  Was to pt encouraged to call back with questions or concerns? Yes

## 2020-09-05 ENCOUNTER — Encounter: Payer: Self-pay | Admitting: Nurse Practitioner

## 2020-09-05 ENCOUNTER — Other Ambulatory Visit: Payer: Self-pay

## 2020-09-05 ENCOUNTER — Ambulatory Visit (INDEPENDENT_AMBULATORY_CARE_PROVIDER_SITE_OTHER): Payer: Medicare Other | Admitting: Nurse Practitioner

## 2020-09-05 VITALS — BP 122/72 | HR 60 | Temp 98.2°F | Ht 64.4 in | Wt 204.8 lb

## 2020-09-05 DIAGNOSIS — E1122 Type 2 diabetes mellitus with diabetic chronic kidney disease: Secondary | ICD-10-CM

## 2020-09-05 DIAGNOSIS — E039 Hypothyroidism, unspecified: Secondary | ICD-10-CM | POA: Diagnosis not present

## 2020-09-05 DIAGNOSIS — C189 Malignant neoplasm of colon, unspecified: Secondary | ICD-10-CM | POA: Diagnosis not present

## 2020-09-05 DIAGNOSIS — N184 Chronic kidney disease, stage 4 (severe): Secondary | ICD-10-CM

## 2020-09-05 DIAGNOSIS — D649 Anemia, unspecified: Secondary | ICD-10-CM | POA: Diagnosis not present

## 2020-09-05 LAB — TYPE AND SCREEN
ABO/RH(D): A NEG
Antibody Screen: NEGATIVE
Unit division: 0
Unit division: 0

## 2020-09-05 LAB — BPAM RBC
Blood Product Expiration Date: 202203102359
Blood Product Expiration Date: 202203122359
ISSUE DATE / TIME: 202202181044
ISSUE DATE / TIME: 202202181153
Unit Type and Rh: 600
Unit Type and Rh: 600

## 2020-09-05 MED ORDER — BASAGLAR KWIKPEN 100 UNIT/ML ~~LOC~~ SOPN: 20.0000 [IU] | PEN_INJECTOR | Freq: Every day | SUBCUTANEOUS | 1 refills | Status: DC

## 2020-09-05 NOTE — Progress Notes (Signed)
I,Yamilka Roman [employer] as a Education administrator for [employer], FNP.,have documented all relevant documentation on the behalf of [name], FNP,as directed by  [name], FNP while in the presence of [name], [place]. This visit occurred during the SARS-CoV-2 public health emergency.  Safety protocols were in place, including screening questions prior to the visit, additional usage of staff PPE, and extensive cleaning of exam room while observing appropriate contact time as indicated for disinfecting solutions.  Subjective:     Patient ID: Patricia Mata , female    DOB: 1956/05/15 , 65 y.o.   MRN: [id number]   Chief Complaint  Patient presents with  . Hospitalization Follow-up    HPI  Patient presents today for a hospital f/u. She was admitted to the hospital on 2/17-2/19. When she tried to sit up she was weak, blurred vision and felt bad for her cancer center appt.  She had an EGD and capsule and did not see any active bleeding.  She was given 3 units PRBC.    She has not required home health or physical therapy.  She feels after having her infusion she felt "brand new" She did not have any shortness of breath.   She is not requiring any PT or OT.     Past Medical History:  Diagnosis Date  . Acute renal failure (ARF) ([place]) 06/08/2018  . Cancer [facility]) 2008   Colon   . Chronic diastolic (congestive) heart failure ([place])   . Diabetes mellitus without complication ([place])   . Gout 09/08/2018  . Hypertension   . Hypothyroidism   . Macrocytic anemia   . MDS (myelodysplastic syndrome) ([place])   . Stage 4 chronic kidney disease ([facility])      Family History  Problem Relation Age of Onset  . Hypertension Mother   . Diabetes Mother   . Cervical cancer Mother   . Heart attack Father   . Hypertension Sister   . Hypertension Brother   . Hypertension Sister   . Hypertension Sister   . Prostate cancer Brother   . HIV/AIDS Brother      Current Outpatient Medications:  .   allopurinol (ZYLOPRIM) 300 MG tablet, Take 300 mg by mouth daily., Disp: , Rfl:  .  amiodarone (PACERONE) 200 MG tablet, Take 1 tablet (200 mg total) by mouth daily., Disp: 90 tablet, Rfl: 3 .  atorvastatin (LIPITOR) 40 MG tablet, Take 1 tablet by mouth once daily (Patient taking differently: Take 40 mg by mouth daily.), Disp: 90 tablet, Rfl: 0 .  blood glucose meter kit and supplies, Dispense based on patient and insurance preference. Use up to four times daily as directed. (FOR ICD-10 E10.9, E11.9)., Disp: 1 each, Rfl: 3 .  Blood Glucose Monitoring Suppl (ONETOUCH ULTRALINK) w/Device KIT, Check blood sugars twice daily E11.9, Disp: 1 kit, Rfl: 0 .  cloNIDine (CATAPRES) 0.1 MG tablet, Take 1 tablet (0.1 mg total) by mouth 2 (two) times daily., Disp: 60 tablet, Rfl: 11 .  fluticasone (FLONASE) 50 MCG/ACT nasal spray, Place 1 spray into both nostrils daily., Disp: 16 g, Rfl: 2 .  furosemide (LASIX) 40 MG tablet, Take 1 tablet (40 mg total) by mouth daily. Take 80 mg in AM and 40 mg in PM (4PM), Disp: 90 tablet, Rfl: 0 .  glucose blood test strip, check blood sugars twice daily Dx code E11.22, Disp: 100 each, Rfl: 3 .  Insulin Pen Needle 29G X 5MM MISC, Use as directed, Disp: 200 each, Rfl: 0 .  Lancets (ONETOUCH DELICA PLUS [id number]) MISC, check blood sugars twice daily Dx code: E11.22, Disp: 100 each, Rfl: 3 .  latanoprost (XALATAN) 0.005 % ophthalmic solution, Place 1 drop into both eyes at bedtime. , Disp: , Rfl:  .  linagliptin (TRADJENTA) 5 MG TABS tablet, Take 1 tablet (5 mg total) by mouth daily., Disp: 90 tablet, Rfl: 1 .  loratadine (CLARITIN) 10 MG tablet, Take 10 mg by mouth daily., Disp: , Rfl:  .  Multiple Vitamins-Minerals (CENTRUM SILVER 50+WOMEN) TABS, Take 1 tablet by mouth daily., Disp: , Rfl:  .  sodium bicarbonate 650 MG tablet, Take 650 mg by mouth 2 (two) times daily., Disp: , Rfl:  .  sodium chloride (OCEAN) 0.65 % nasal spray, Place 1 spray into the nose in the morning, at  noon, in the evening, and at bedtime., Disp: , Rfl:  .  carvedilol (COREG) 6.25 MG tablet, Take 1 tablet (6.25 mg total) by mouth 2 (two) times daily., Disp: 60 tablet, Rfl: 3 .  hydrALAZINE (APRESOLINE) 100 MG tablet, Take 1 tablet (100 mg total) by mouth 3 (three) times daily., Disp: 90 tablet, Rfl: 11 .  Insulin Glargine (BASAGLAR KWIKPEN) 100 UNIT/ML, Inject 20 Units into the skin daily., Disp: 15 mL, Rfl: 1 .  levothyroxine (SYNTHROID) 150 MCG tablet, Take 1 tablet (150 mcg total) by mouth daily before breakfast., Disp: 30 tablet, Rfl: 2 .  omeprazole (PRILOSEC) 40 MG capsule, Take 1 capsule (40 mg total) by mouth 2 (two) times daily before a meal., Disp: 60 capsule, Rfl: 1 .  oxyCODONE-acetaminophen (PERCOCET) 5-325 MG tablet, Take 1 tablet by mouth every 4 (four) hours as needed for severe pain., Disp: 20 tablet, Rfl: 0 .  sildenafil (REVATIO) 20 MG tablet, Take 1 tablet (20 mg total) by mouth 3 (three) times daily., Disp: 90 tablet, Rfl: 3   Allergies  Allergen Reactions  . Ancef [Cefazolin] Itching    Severe itching- after procedure, ancef was the antibiotic.-04/02/17 Tolerates penicillins  . Lactose Intolerance (Gi) Diarrhea  . Sulfa Antibiotics Rash and Other (See Comments)    Blisters, also     Review of Systems  Constitutional: Negative.   HENT: Negative.   Eyes: Negative.   Respiratory: Negative.   Cardiovascular: Negative.   Gastrointestinal: Negative.   Endocrine: Negative.   Genitourinary: Negative.   Musculoskeletal: Negative.   Skin: Negative.   Neurological: Negative.   Hematological: Negative.   Psychiatric/Behavioral: Negative.      Today's Vitals   09/05/20 1202  BP: 122/72  Pulse: 60  Temp: 98.2 F (36.8 C)  TempSrc: Oral  Weight: 204 lb 12.8 oz (92.9 kg)  Height: 5' 4.4" (1.636 m)  PainSc: 0-No pain   Body mass index is 34.72 kg/m.   Objective:  Physical Exam Constitutional:      General: She is not in acute distress.    Appearance: Normal  appearance. She is obese.  Cardiovascular:     Rate and Rhythm: Normal rate and regular rhythm.     Pulses: Normal pulses.     Heart sounds: Murmur (3/6 murmur) heard.    Pulmonary:     Effort: Pulmonary effort is normal. No respiratory distress.     Breath sounds: Normal breath sounds. No wheezing.  Musculoskeletal:        General: Normal range of motion.     Cervical back: Normal range of motion and neck supple.  Skin:    General: Skin is warm and dry.     Capillary  Refill: Capillary refill takes less than 2 seconds.  Neurological:     General: No focal deficit present.     Mental Status: She is alert and oriented to person, place, and time.     Cranial Nerves: No cranial nerve deficit.     Motor: No weakness.  Psychiatric:        Mood and Affect: Mood normal.        Behavior: Behavior normal.        Thought Content: Thought content normal.        Judgment: Judgment normal.         Assessment And Plan:     1. Symptomatic anemia  TCM Performed. A member of the clinical team spoke with the patient upon dischare. Discharge summary was reviewed in full detail during the visit. Meds reconciled and compared to discharge meds. Medication list is updated and reviewed with the patient.  Greater than 50% face to face time was spent in counseling an coordination of care.  All questions were answered to the satisfaction of the patient.    She is feeling much better after having 3 units of PRBCs - BMP8+eGFR - CBC  2. Acquired hypothyroidism  Chronic, controlled  Continue with current medications pending results of her labs will make adjustments - TSH - T4 - T3, free  3. Malignant neoplasm of colon, unspecified part of colon ([place])  Continue follow up with Oncology  4. Type 2 diabetes mellitus with stage 4 chronic kidney disease, without long-term current use of insulin (HCC)  Chronic, she is to continue with Basaglar  Continue with current medications  Encouraged to  limit intake of sugary foods and drinks  Encouraged to increase physical activity to 150 minutes per week as tolerated - Insulin Glargine (BASAGLAR KWIKPEN) 100 UNIT/ML; Inject 20 Units into the skin daily.  Dispense: 15 mL; Refill: 1     Patient was given opportunity to ask questions. Patient verbalized understanding of the plan and was able to repeat key elements of the plan. All questions were answered to their satisfaction.  [name], FNP   I, [name], FNP, have reviewed all documentation for this visit. The documentation on 09/05/20 for the exam, diagnosis, procedures, and orders are all accurate and complete.   THE PATIENT IS ENCOURAGED TO PRACTICE SOCIAL DISTANCING DUE TO THE COVID-19 PANDEMIC.

## 2020-09-05 NOTE — Anesthesia Postprocedure Evaluation (Signed)
Anesthesia Post Note  Patient: Patricia Mata  Procedure(s) Performed: ESOPHAGOGASTRODUODENOSCOPY (EGD) (N/A ) GIVENS CAPSULE STUDY (N/A )     Patient location during evaluation: PACU Anesthesia Type: MAC Level of consciousness: awake and alert Pain management: pain level controlled Vital Signs Assessment: post-procedure vital signs reviewed and stable Respiratory status: spontaneous breathing, nonlabored ventilation, respiratory function stable and patient connected to nasal cannula oxygen Cardiovascular status: stable and blood pressure returned to baseline Postop Assessment: no apparent nausea or vomiting Anesthetic complications: no   No complications documented.  Last Vitals:  Vitals:   09/02/20 0442 09/02/20 1309  BP: (!) 150/44 (!) 96/47  Pulse: (!) 53 92  Resp: 18   Temp: 36.7 C 37.2 C  SpO2: 99% 96%    Last Pain:  Vitals:   09/02/20 1309  TempSrc: Oral  PainSc:                  [name]

## 2020-09-06 ENCOUNTER — Other Ambulatory Visit (HOSPITAL_COMMUNITY): Payer: Self-pay | Admitting: Internal Medicine

## 2020-09-06 LAB — CBC
Hematocrit: 24.3 % — ABNORMAL LOW (ref 34.0–46.6)
Hemoglobin: 8 g/dL — ABNORMAL LOW (ref 11.1–15.9)
MCH: 29.6 pg (ref 26.6–33.0)
MCHC: 32.9 g/dL (ref 31.5–35.7)
MCV: 90 fL (ref 79–97)
NRBC: 1 % — ABNORMAL HIGH (ref 0–0)
Platelets: 187 10*3/uL (ref 150–450)
RBC: 2.7 x10E6/uL — CL (ref 3.77–5.28)
RDW: 16.6 % — ABNORMAL HIGH (ref 11.7–15.4)
WBC: 5.9 10*3/uL (ref 3.4–10.8)

## 2020-09-06 LAB — BMP8+EGFR
BUN/Creatinine Ratio: 26 (ref 12–28)
BUN: 89 mg/dL (ref 8–27)
CO2: 21 mmol/L (ref 20–29)
Calcium: 9.1 mg/dL (ref 8.7–10.3)
Chloride: 101 mmol/L (ref 96–106)
Creatinine, Ser: 3.37 mg/dL — ABNORMAL HIGH (ref 0.57–1.00)
GFR calc Af Amer: 16 mL/min/{1.73_m2} — ABNORMAL LOW (ref >59)
GFR calc non Af Amer: 14 mL/min/{1.73_m2} — ABNORMAL LOW (ref >59)
Glucose: 191 mg/dL — ABNORMAL HIGH (ref 65–99)
Potassium: 4.4 mmol/L (ref 3.5–5.2)
Sodium: 139 mmol/L (ref 134–144)

## 2020-09-06 LAB — TSH: TSH: 3.55 u[IU]/mL (ref 0.450–4.500)

## 2020-09-06 LAB — T4: T4, Total: 8.7 ug/dL (ref 4.5–12.0)

## 2020-09-06 LAB — T3, FREE: T3, Free: 1.5 pg/mL — ABNORMAL LOW (ref 2.0–4.4)

## 2020-09-06 NOTE — Patient Instructions (Signed)
Marland Kitchen Goals Addressed            This Visit's Progress   . Manage My Medicine       Timeframe:  Long-Range Goal Priority:  High Start Date:                             Expected End Date:                       Follow Up Date 12/05/2020   - call for medicine refill 2 or 3 days before it runs out - keep a list of all the medicines I take; vitamins and herbals too - use a pillbox to sort medicine    Why is this important?   . These steps will help you keep on track with your medicines.         Patient Care Plan: CCM Pharmacy Care Plan    Problem Identified: HLD, HTN, DM, HF   Priority: High    Long-Range Goal: Disease Management   This Visit's Progress: On track  Priority: High  Note:   Current Barriers:  . Does not adhere to prescribed medication regimen   Pharmacist Clinical Goal(s):  Marland Kitchen Over the next 90 days, patient will achieve adherence to monitoring guidelines and medication adherence to achieve therapeutic efficacy through collaboration with PharmD and provider.    Interventions: . 1:1 collaboration with [name], FNP regarding development and update of comprehensive plan of care as evidenced by provider attestation and co-signature . Inter-disciplinary care team collaboration (see longitudinal plan of care) . Comprehensive medication review performed; medication list updated in electronic medical record  Hypertension (BP goal <130/80) -controlled -Current treatment: . Carvedilol 6.25 mg tablet two times per day  . Hydralazine 100 mg take 1 tablet three times per day . Clonidine 0.1 mg tablet twice per day  -Current home readings:  117/70, 115/65, 127/70 -Current dietary habits: Patient reports eating a low sodium diet  -Current exercise habits:will discuss at next office visit  -Denies hypotensive/hypertensive symptoms -Educated on Daily salt intake goal < 2300 mg; Symptoms of hypotension and importance of maintaining adequate hydration; -Counseled to  monitor BP at home at least 3 times per week, document, and provide log at future appointments -Recommended to continue current medication  Hyperlipidemia: (LDL goal <70 ) -uncontrolled -Current treatment: . Atorvastatin 40 mg - take daily.  -Current dietary patterns: patient reports not eating a lot of fried, fatty foods  -Current exercise habits: will discuss further during next office visit -Educated on Cholesterol goals;  Benefits of statin for ASCVD risk reduction; Importance of limiting foods high in cholesterol; -Recommended to continue current medication  Diabetes (A1c goal <7%) -controlled -Current medications: . Insulin Glargine 100 unit/ml- inject 18 units if blood sugar is over 150  . Tradjenta 5 mg - take 1 tablet daily  -Current home glucose readings -Denies hypoglycemic/hyperglycemic symptoms -Current meal patterns:  . breakfast: Eggs or a waffle, sometimes oatmel  . lunch: None  . Dinner: Her husband cooks but he is limiting the amount of salt in the food  . drinks: Water- she drinks three bottles of water or 2.5, sometimes a small soda -Current exercise: not discussed during this office visit  -Educated onA1c and blood sugar goals; Prevention and management of hypoglycemic episodes; -Counseled to check feet daily and get yearly eye exams -Recommended to continue current medication  Heart Failure (Goal: control symptoms  and prevent exacerbations) controlled -Current treatment:  Furosemide 40 mg - take 2 tablets by mouth in the morning and one tablet at night   Clonidine 0.1 mg - taking 1 tablet by mouth two times daily  Amiodarone 200 mg tablet one   -Educated on Importance of weighing daily; if you gain more than 3 pounds in one day or 5 pounds in one week, patient taking the record of weights and BP to her doctors -Recommended to continue current medication   Patient Goals/Self-Care Activities . Over the next 90 days, patient will:  - take  medications as prescribed  Follow Up Plan: Telephone follow up appointment with care management team member scheduled for:  12/05/2020     Visit Information  Goals Addressed            This Visit's Progress   . Manage My Medicine       Timeframe:  Long-Range Goal Priority:  High Start Date:                             Expected End Date:                       Follow Up Date 12/05/2020   - call for medicine refill 2 or 3 days before it runs out - keep a list of all the medicines I take; vitamins and herbals too - use a pillbox to sort medicine    Why is this important?   . These steps will help you keep on track with your medicines.         Patient Care Plan: CCM Pharmacy Care Plan    Problem Identified: HLD, HTN, DM, HF   Priority: High    Long-Range Goal: Disease Management   This Visit's Progress: On track  Priority: High  Note:   Current Barriers:  . Does not adhere to prescribed medication regimen   Pharmacist Clinical Goal(s):  Marland Kitchen Over the next 90 days, patient will achieve adherence to monitoring guidelines and medication adherence to achieve therapeutic efficacy through collaboration with PharmD and provider.    Interventions: . 1:1 collaboration with [name], FNP regarding development and update of comprehensive plan of care as evidenced by provider attestation and co-signature . Inter-disciplinary care team collaboration (see longitudinal plan of care) . Comprehensive medication review performed; medication list updated in electronic medical record  Hypertension (BP goal <130/80) -controlled -Current treatment: . Carvedilol 6.25 mg tablet two times per day  . Hydralazine 100 mg take 1 tablet three times per day . Clonidine 0.1 mg tablet twice per day  -Current home readings:  117/70, 115/65, 127/70 -Current dietary habits: Patient reports eating a low sodium diet  -Current exercise habits:will discuss at next office visit  -Denies  hypotensive/hypertensive symptoms -Educated on Daily salt intake goal < 2300 mg; Symptoms of hypotension and importance of maintaining adequate hydration; -Counseled to monitor BP at home at least 3 times per week, document, and provide log at future appointments -Recommended to continue current medication  Hyperlipidemia: (LDL goal <70 ) -uncontrolled -Current treatment: . Atorvastatin 40 mg - take daily.  -Current dietary patterns: patient reports not eating a lot of fried, fatty foods  -Current exercise habits: will discuss further during next office visit -Educated on Cholesterol goals;  Benefits of statin for ASCVD risk reduction; Importance of limiting foods high in cholesterol; -Recommended to continue current medication  Diabetes (A1c goal <  7%) -controlled -Current medications: . Insulin Glargine 100 unit/ml- inject 18 units if blood sugar is over 150  . Tradjenta 5 mg - take 1 tablet daily  -Current home glucose readings -Denies hypoglycemic/hyperglycemic symptoms -Current meal patterns:  . breakfast: Eggs or a waffle, sometimes oatmel  . lunch: None  . Dinner: Her husband cooks but he is limiting the amount of salt in the food  . drinks: Water- she drinks three bottles of water or 2.5, sometimes a small soda -Current exercise: not discussed during this office visit  -Educated onA1c and blood sugar goals; Prevention and management of hypoglycemic episodes; -Counseled to check feet daily and get yearly eye exams -Recommended to continue current medication  Heart Failure (Goal: control symptoms and prevent exacerbations) controlled -Current treatment:  Furosemide 40 mg - take 2 tablets by mouth in the morning and one tablet at night   Clonidine 0.1 mg - taking 1 tablet by mouth two times daily  Amiodarone 200 mg tablet one   -Educated on Importance of weighing daily; if you gain more than 3 pounds in one day or 5 pounds in one week, patient taking the record of  weights and BP to her doctors -Recommended to continue current medication   Patient Goals/Self-Care Activities . Over the next 90 days, patient will:  - take medications as prescribed  Follow Up Plan: Telephone follow up appointment with care management team member scheduled for:  12/05/2020       The patient verbalized understanding of instructions, educational materials, and care plan provided today and agreed to receive a mailed copy of patient instructions, educational materials, and care plan.  Telephone follow up appointment with pharmacy team member scheduled for: 12/06/2020  Mayford Knife, [facility]

## 2020-09-07 ENCOUNTER — Ambulatory Visit: Payer: BC Managed Care – PPO | Admitting: Nurse Practitioner

## 2020-09-07 ENCOUNTER — Other Ambulatory Visit: Payer: Self-pay | Admitting: Nurse Practitioner

## 2020-09-07 DIAGNOSIS — E039 Hypothyroidism, unspecified: Secondary | ICD-10-CM

## 2020-09-07 MED ORDER — LEVOTHYROXINE SODIUM 150 MCG PO TABS: 150.0000 ug | ORAL_TABLET | Freq: Every day | ORAL | 2 refills | Status: DC

## 2020-09-08 ENCOUNTER — Telehealth: Payer: Self-pay

## 2020-09-08 NOTE — Telephone Encounter (Signed)
Spoke with [facility] - rescheduled the patient for mapping and visit with Dr. Donzetta Matters on 09/18/2020. Confirmed appointment with the patient. She understands to arrive at 7:45AM for check-in.  She was grateful for call and agrees with plan.

## 2020-09-11 ENCOUNTER — Ambulatory Visit (INDEPENDENT_AMBULATORY_CARE_PROVIDER_SITE_OTHER): Payer: Medicare Other | Admitting: Pharmacist Clinician (PhC)/ Clinical Pharmacy Specialist

## 2020-09-11 ENCOUNTER — Other Ambulatory Visit: Payer: Self-pay

## 2020-09-11 DIAGNOSIS — I10 Essential (primary) hypertension: Secondary | ICD-10-CM | POA: Diagnosis not present

## 2020-09-11 NOTE — Assessment & Plan Note (Signed)
Patient with essential hypertension, currently at BP goal.  She notes most home readings have been similar.  She should continue with current regimen and record readings in [place] daily.  Should she notice her home readings trending upward, she can reach out to the office for advice or appointment.

## 2020-09-11 NOTE — Patient Instructions (Signed)
  Check your blood pressure at home daily and keep record of the readings.  Take your BP meds as follows:  Continue with all current medications  Bring all of your meds, your BP cuff and your record of home blood pressures to your next appointment.  Exercise as you're able, try to walk approximately 30 minutes per day.  Keep salt intake to a minimum, especially watch canned and prepared boxed foods.  Eat more fresh fruits and vegetables and fewer canned items.  Avoid eating in fast food restaurants.    HOW TO TAKE YOUR BLOOD PRESSURE: . Rest 5 minutes before taking your blood pressure. .  Don't smoke or drink caffeinated beverages for at least 30 minutes before. . Take your blood pressure before (not after) you eat. . Sit comfortably with your back supported and both feet on the floor (don't cross your legs). . Elevate your arm to heart level on a table or a desk. . Use the proper sized cuff. It should fit smoothly and snugly around your bare upper arm. There should be enough room to slip a fingertip under the cuff. The bottom edge of the cuff should be 1 inch above the crease of the elbow. . Ideally, take 3 measurements at one sitting and record the average.

## 2020-09-11 NOTE — Progress Notes (Signed)
HPI:  Patricia Mata is a 65 y.o. female patient of Dr Oval Linsey, with a [place] below who presents today for advanced hypertension clinic follow up.  She has been seen multiple times for blood pressure control over the past year.  Chart indicates that she was first diagnosed with hypertension in her 34's, and has not been well controlled.  Renal artery dopplers showed a >60% stenosis of the right renal artery, but Dr. Gwenlyn Found felt this was not a cause for her hypertension.  Since that time her pressure has been labile with readings ranging from 751-025 systolic and 85-27 diastolic.  Home readings have been better, with the highest in the past few weeks at 782 systolic.    Patient was recently hospitalized for anemia and underwent capsule endoscopy.  She states feeling better, after being transfused with 3 units of blood.  On discharge her carvedilol dose was decreased from 12.5 mg to 6.25 mg twice daily.  She also notes that the doxazosin was stopped "awhile ago".    Today she returns for hypertension follow up.  Her home readings have been looking good, states she checks daily but only enters about 1/2 of them into [facility].  No problems with compliance or side effects from any of her medications.    Past Medical History: PAF CHADS2-VASc score of 5 - no anticoagulation or rate/rhythm control  DM2 A1c 6.2 - On basaglar, tradjenta   Myelodysplastic sydrome W/ transfusion dependent anemia  CKD 2/22 - SCr 3.37, GFR 16  Pulmonary hypertension On sildenafil 20 mg tid    Blood Pressure Goal:  130/80  Current Medications:  amlodipine 10 mg every morning clonidine 0.1 mg twice daily (am, 5pm) furosemide 40 mg every morning hydralazine 100 mg three times daily (8am, 5pm & 11pm) Carvedilol 6.25 mg bid  Family Hx: mother died at 2 from massive MI, was healthy until then; father probable MI about 79 years ago, was probably in his late 8's; oldest sister with hypertension, another with hypertension;  (4 sis, 3 bro); 1 son Armed forces logistics/support/administrative officer) with some valve issues (pt unsure) since childhood  Social Hx: no tobacco, 2 mixed drinks per night; drinks decaf coffee  Diet: mostly home cooked; rarely fries food; plenty of vegetables, fewer fruits Prefers not to eat out - uses light salt [employer]); occasional cookies   Exercise: loves exercise class with Pam; using exercise bike at home occasonally; wants to continue, ordered DVD - aerobics for seniors  Exercise bike at home "walk away the pounds" (Netflix)  Home BP readings: per [facility] last 8 readings (over 2 weeks) average 131/54, however last 3 of those were 423-536 systolic.      Intolerances: sulfa antibiotics, cefazolin, dairy  Labs: 7/22: Na 136, K 4.4, Glu 179, BUN 46, SCr 2.9, GFR 19  Sleep Apnea (no symptoms)  Renal artery stenosis (renal artery Dopplers shows > 60% stenosis) Hyperaldosteronism (testing not indicated) Hyper/hypothyroidism (mildly hypothyroid 11/2019- meds adjusted) Pheochromocytoma: (testing not indicated) Cushing's syndrome:(testing not indicated) Coarctation of the aorta  Wt Readings from Last 3 Encounters:  09/11/20 206 lb (93.4 kg)  09/05/20 204 lb 12.8 oz (92.9 kg)  09/02/20 206 lb 2.1 oz (93.5 kg)   BP Readings from Last 3 Encounters:  09/11/20 (!) 122/58  09/05/20 122/72  09/02/20 (!) 96/47   Pulse Readings from Last 3 Encounters:  09/11/20 60  09/05/20 60  09/02/20 92    Current Outpatient Medications  Medication Sig Dispense Refill  . allopurinol (ZYLOPRIM) 300  MG tablet Take 300 mg by mouth daily.    Marland Kitchen amiodarone (PACERONE) 200 MG tablet Take 1 tablet (200 mg total) by mouth daily. 90 tablet 3  . atorvastatin (LIPITOR) 40 MG tablet Take 1 tablet by mouth once daily (Patient taking differently: Take 40 mg by mouth daily.) 90 tablet 0  . blood glucose meter kit and supplies Dispense based on patient and insurance preference. Use up to four times daily as directed. (FOR ICD-10 E10.9, E11.9). 1 each 3  .  Blood Glucose Monitoring Suppl (ONETOUCH ULTRALINK) w/Device KIT Check blood sugars twice daily E11.9 1 kit 0  . carvedilol (COREG) 6.25 MG tablet Take 1 tablet (6.25 mg total) by mouth 2 (two) times daily. 60 tablet 0  . cloNIDine (CATAPRES) 0.1 MG tablet Take 1 tablet (0.1 mg total) by mouth 2 (two) times daily. 60 tablet 11  . fluticasone (FLONASE) 50 MCG/ACT nasal spray Place 1 spray into both nostrils daily. 16 g 2  . furosemide (LASIX) 40 MG tablet Take 1 tablet (40 mg total) by mouth daily. Take 80 mg in AM and 40 mg in PM (4PM) 90 tablet 0  . glucose blood test strip check blood sugars twice daily Dx code E11.22 100 each 3  . hydrALAZINE (APRESOLINE) 100 MG tablet TAKE 1 TABLET BY MOUTH THREE TIMES DAILY (Patient taking differently: Take 100 mg by mouth 3 (three) times daily.) 90 tablet 0  . Insulin Glargine (BASAGLAR KWIKPEN) 100 UNIT/ML Inject 20 Units into the skin daily. 15 mL 1  . Insulin Pen Needle 29G X 5MM MISC Use as directed 200 each 0  . Lancets (ONETOUCH DELICA PLUS [id number]) MISC check blood sugars twice daily Dx code: E11.22 100 each 3  . latanoprost (XALATAN) 0.005 % ophthalmic solution Place 1 drop into both eyes at bedtime.     Marland Kitchen levothyroxine (SYNTHROID) 150 MCG tablet Take 1 tablet (150 mcg total) by mouth daily before breakfast. 30 tablet 2  . linagliptin (TRADJENTA) 5 MG TABS tablet Take 1 tablet (5 mg total) by mouth daily. 90 tablet 1  . loratadine (CLARITIN) 10 MG tablet Take 10 mg by mouth daily.    . Multiple Vitamins-Minerals (CENTRUM SILVER 50+WOMEN) TABS Take 1 tablet by mouth daily.    Marland Kitchen omeprazole (PRILOSEC) 40 MG capsule Take 1 capsule (40 mg total) by mouth 2 (two) times daily before a meal. 60 capsule 1  . sildenafil (REVATIO) 20 MG tablet Take 1 tablet (20 mg total) by mouth 3 (three) times daily. 90 tablet 3  . sodium bicarbonate 650 MG tablet Take 650 mg by mouth 2 (two) times daily.    . sodium chloride (OCEAN) 0.65 % nasal spray Place 1 spray into the  nose in the morning, at noon, in the evening, and at bedtime.     No current facility-administered medications for this visit.    Allergies  Allergen Reactions  . Ancef [Cefazolin] Itching    Severe itching- after procedure, ancef was the antibiotic.-04/02/17 Tolerates penicillins  . Lactose Intolerance (Gi) Diarrhea  . Sulfa Antibiotics Rash and Other (See Comments)    Blisters, also    Past Medical History:  Diagnosis Date  . Acute renal failure (ARF) ([place]) 06/08/2018  . Cancer [facility]) 2008   Colon   . Chronic diastolic (congestive) heart failure ([place])   . Diabetes mellitus without complication ([place])   . Gout 09/08/2018  . Hypertension   . Macrocytic anemia   . MDS (myelodysplastic syndrome) ([place])   .  Stage 4 chronic kidney disease (HCC)     Blood pressure (!) 122/58, pulse 60, resp. rate 15, height '5\' 4"'  (1.626 m), weight 206 lb (93.4 kg), SpO2 98 %.  Essential hypertension Patient with essential hypertension, currently at BP goal.  She notes most home readings have been similar.  She should continue with current regimen and record readings in [place] daily.  Should she notice her home readings trending upward, she can reach out to the office for advice or appointment.    Tommy Medal PharmD CPP [facility] [address] [place] [address] 09/11/2020 2:57 PM

## 2020-09-12 ENCOUNTER — Telehealth: Payer: Self-pay

## 2020-09-12 ENCOUNTER — Other Ambulatory Visit: Payer: Self-pay

## 2020-09-12 DIAGNOSIS — Z85038 Personal history of other malignant neoplasm of large intestine: Secondary | ICD-10-CM | POA: Insufficient documentation

## 2020-09-12 DIAGNOSIS — N183 Chronic kidney disease, stage 3 unspecified: Secondary | ICD-10-CM

## 2020-09-12 DIAGNOSIS — Z0000 Encounter for general adult medical examination without abnormal findings: Secondary | ICD-10-CM

## 2020-09-12 DIAGNOSIS — K219 Gastro-esophageal reflux disease without esophagitis: Secondary | ICD-10-CM | POA: Insufficient documentation

## 2020-09-12 NOTE — Telephone Encounter (Signed)
Called patient to discuss health coaching for increasing physical activity per pathway survey. Patient stated that she is currently working on increasing her physical activity with various strategies, but is taking it slow for now. She stated that the physical activity that she engages in has helped her maintain her weight. Currently, she is not interested in health coaching for physical activity.   Patient is aware that Care Guide will call if she has missing BP readings in [place].

## 2020-09-18 ENCOUNTER — Other Ambulatory Visit: Payer: Self-pay

## 2020-09-18 ENCOUNTER — Ambulatory Visit (INDEPENDENT_AMBULATORY_CARE_PROVIDER_SITE_OTHER)
Admission: RE | Admit: 2020-09-18 | Discharge: 2020-09-18 | Disposition: A | Discharge status: Home / Self Care | Payer: Medicare Other | Source: Ambulatory Visit | Attending: Vascular Surgery | Admitting: Vascular Surgery

## 2020-09-18 ENCOUNTER — Encounter: Payer: Self-pay | Admitting: Vascular Surgery

## 2020-09-18 ENCOUNTER — Ambulatory Visit (INDEPENDENT_AMBULATORY_CARE_PROVIDER_SITE_OTHER): Payer: Medicare Other | Admitting: Vascular Surgery

## 2020-09-18 ENCOUNTER — Ambulatory Visit (HOSPITAL_COMMUNITY)
Admission: RE | Admit: 2020-09-18 | Discharge: 2020-09-18 | Disposition: A | Discharge status: Home / Self Care | Payer: Medicare Other | Source: Ambulatory Visit | Attending: Vascular Surgery | Admitting: Vascular Surgery

## 2020-09-18 VITALS — BP 147/72 | HR 63 | Temp 98.4°F | Resp 20 | Ht 64.0 in | Wt 205.0 lb

## 2020-09-18 DIAGNOSIS — N183 Chronic kidney disease, stage 3 unspecified: Secondary | ICD-10-CM

## 2020-09-18 NOTE — H&P (View-Only) (Signed)
Patient ID: Patricia Mata, female   DOB: Nov 17, 1955, 65 y.o.   MRN: [id number]  Reason for Consult: New Patient (Initial Visit)   Referred by [name], FNP  Subjective:     HPI:  Patricia Mata is a 65 y.o. female previous history of acute renal failure believes that she has been on dialysis temporarily via catheter.  Also had previous colon cancer with a right-sided port in place.  No other breast or upper extremity surgery.  Not currently on dialysis.  Denies any blood thinners.  She is left-hand dominant.  She was hospitalized with fluid overload treated with Lasix.  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  She denies any recent weight loss.  Past Medical History:  Diagnosis Date  . Acute renal failure (ARF) ([place]) 06/08/2018  . Cancer [facility]) 2008   Colon   . Chronic diastolic (congestive) heart failure ([place])   . Diabetes mellitus without complication ([place])   . Gout 09/08/2018  . Hypertension   . Macrocytic anemia   . MDS (myelodysplastic syndrome) ([place])   . Stage 4 chronic kidney disease (HCC)    Family History  Problem Relation Age of Onset  . Hypertension Mother   . Diabetes Mother   . Cervical cancer Mother   . Heart attack Father   . Hypertension Sister   . Hypertension Brother   . Hypertension Sister   . Hypertension Sister   . Prostate cancer Brother   . HIV/AIDS Brother    Past Surgical History:  Procedure Laterality Date  . COLON SURGERY    . DEBRIDMENT OF DECUBITUS ULCER N/A 06/12/2018   Procedure: DEBRIDMENT OF DECUBITUS ULCER;  Surgeon: Wallace Going, DO;  Location: [facility];  Service: Plastics;  Laterality: N/A;  . ESOPHAGOGASTRODUODENOSCOPY N/A 09/01/2020   Procedure: ESOPHAGOGASTRODUODENOSCOPY (EGD);  Surgeon: Clarene Essex, MD;  Location: [facility];  Service: Endoscopy;  Laterality: N/A;  . GIVENS CAPSULE STUDY N/A 09/01/2020   Procedure: GIVENS CAPSULE STUDY;  Surgeon: Clarene Essex, MD;   Location: [facility];  Service: Endoscopy;  Laterality: N/A;  . IR FLUORO GUIDE PORT INSERTION RIGHT  04/02/2017  . IR REMOVAL TUN ACCESS W/ PORT W/O FL MOD SED  03/16/2019  . IR US GUIDE VASC ACCESS RIGHT  04/02/2017  . RIGHT HEART CATH N/A 04/23/2019   Procedure: RIGHT HEART CATH;  Surgeon: Jolaine Artist, MD;  Location: [facility];  Service: Cardiovascular;  Laterality: N/A;  . RIGHT/LEFT HEART CATH AND CORONARY ANGIOGRAPHY N/A 05/21/2018   Procedure: RIGHT/LEFT HEART CATH AND CORONARY ANGIOGRAPHY;  Surgeon: Nelva Bush, MD;  Location: [facility];  Service: Cardiovascular;  Laterality: N/A;    Short Social History:  Social History   Tobacco Use  . Smoking status: Former Smoker    Packs/day: 0.25    Years: 20.00    Pack years: 5.00    Types: Cigarettes    Quit date: 01/31/2001    Years since quitting: 19.6  . Smokeless tobacco: Former Network engineer Use Topics  . Alcohol use: Yes    Alcohol/week: 2.0 standard drinks    Types: 2 Shots of liquor per week    Comment: occ    Allergies  Allergen Reactions  . Ancef [Cefazolin] Itching    Severe itching- after procedure, ancef was the antibiotic.-04/02/17 Tolerates penicillins  . Lactose Intolerance (Gi) Diarrhea  . Sulfa Antibiotics Rash and Other (See Comments)    Blisters, also  Current Outpatient Medications  Medication Sig Dispense Refill  . allopurinol (ZYLOPRIM) 300 MG tablet Take 300 mg by mouth daily.    Marland Kitchen amiodarone (PACERONE) 200 MG tablet Take 1 tablet (200 mg total) by mouth daily. 90 tablet 3  . atorvastatin (LIPITOR) 40 MG tablet Take 1 tablet by mouth once daily (Patient taking differently: Take 40 mg by mouth daily.) 90 tablet 0  . blood glucose meter kit and supplies Dispense based on patient and insurance preference. Use up to four times daily as directed. (FOR ICD-10 E10.9, E11.9). 1 each 3  . Blood Glucose Monitoring Suppl (ONETOUCH ULTRALINK) w/Device KIT Check blood sugars twice  daily E11.9 1 kit 0  . carvedilol (COREG) 6.25 MG tablet Take 1 tablet (6.25 mg total) by mouth 2 (two) times daily. 60 tablet 0  . cloNIDine (CATAPRES) 0.1 MG tablet Take 1 tablet (0.1 mg total) by mouth 2 (two) times daily. 60 tablet 11  . fluticasone (FLONASE) 50 MCG/ACT nasal spray Place 1 spray into both nostrils daily. 16 g 2  . furosemide (LASIX) 40 MG tablet Take 1 tablet (40 mg total) by mouth daily. Take 80 mg in AM and 40 mg in PM (4PM) 90 tablet 0  . glucose blood test strip check blood sugars twice daily Dx code E11.22 100 each 3  . hydrALAZINE (APRESOLINE) 100 MG tablet TAKE 1 TABLET BY MOUTH THREE TIMES DAILY (Patient taking differently: Take 100 mg by mouth 3 (three) times daily.) 90 tablet 0  . Insulin Glargine (BASAGLAR KWIKPEN) 100 UNIT/ML Inject 20 Units into the skin daily. 15 mL 1  . Insulin Pen Needle 29G X 5MM MISC Use as directed 200 each 0  . Lancets (ONETOUCH DELICA PLUS [id number]) MISC check blood sugars twice daily Dx code: E11.22 100 each 3  . latanoprost (XALATAN) 0.005 % ophthalmic solution Place 1 drop into both eyes at bedtime.     Marland Kitchen levothyroxine (SYNTHROID) 150 MCG tablet Take 1 tablet (150 mcg total) by mouth daily before breakfast. 30 tablet 2  . linagliptin (TRADJENTA) 5 MG TABS tablet Take 1 tablet (5 mg total) by mouth daily. 90 tablet 1  . loratadine (CLARITIN) 10 MG tablet Take 10 mg by mouth daily.    . Multiple Vitamins-Minerals (CENTRUM SILVER 50+WOMEN) TABS Take 1 tablet by mouth daily.    Marland Kitchen omeprazole (PRILOSEC) 40 MG capsule Take 1 capsule (40 mg total) by mouth 2 (two) times daily before a meal. 60 capsule 1  . sildenafil (REVATIO) 20 MG tablet Take 1 tablet (20 mg total) by mouth 3 (three) times daily. 90 tablet 3  . sodium bicarbonate 650 MG tablet Take 650 mg by mouth 2 (two) times daily.    . sodium chloride (OCEAN) 0.65 % nasal spray Place 1 spray into the nose in the morning, at noon, in the evening, and at bedtime.     No current  facility-administered medications for this visit.    Review of Systems  Constitutional:  Constitutional negative. HENT: HENT negative.  Eyes: Eyes negative.  Respiratory: Positive for cough.  Cardiovascular: Cardiovascular negative.  GI: Gastrointestinal negative.  Neurological: Neurological negative. Psychiatric: Psychiatric negative.        Objective:  Objective   Vitals:   09/18/20 0917  BP: (!) 147/72  Pulse: 63  Resp: 20  Temp: 98.4 F (36.9 C)  SpO2: 95%  Weight: 205 lb (93 kg)  Height: _0  (1.626 m)   Body mass index is 35.19 kg/m.  Physical Exam  HENT:     Head: Normocephalic.     Nose:     Comments: Wearing a mask Eyes:     Pupils: Pupils are equal, round, and reactive to light.  Cardiovascular:     Rate and Rhythm: Normal rate.     Pulses:          Radial pulses are 2+ on the right side and 2+ on the left side.  Pulmonary:     Effort: Pulmonary effort is normal.  Abdominal:     General: Abdomen is flat.     Palpations: Abdomen is soft.  Musculoskeletal:        General: No swelling. Normal range of motion.  Skin:    General: Skin is warm and dry.     Capillary Refill: Capillary refill takes less than 2 seconds.  Neurological:     General: No focal deficit present.     Mental Status: She is alert.  Psychiatric:        Mood and Affect: Mood normal.        Behavior: Behavior normal.        Thought Content: Thought content normal.        Judgment: Judgment normal.     Data: CT IMPRESSION: 1. No CT evidence for acute intra-abdominal or pelvic abnormality. 2. Cardiomegaly. Differential cardiac blood pool likely corresponds to history of anemia. 3. Calcified uterine fibroids.       Right Pre-Dialysis Findings:  +-----------------------+----------+--------------------+----------+-------  -+  Location        PSV (cm/s)Intralum. Diam. (cm)Waveform   Comments   +-----------------------+----------+--------------------+----------+-------  -+  Brachial Antecub. [id number]    0.44        triphasic        +-----------------------+----------+--------------------+----------+-------  -+  Radial Art at Wrist  119    0.35        monophasic       +-----------------------+----------+--------------------+----------+-------  -+  Ulnar Art at Wrist   105    0.22        monophasic       +-----------------------+----------+--------------------+----------+-------  -+    Left Pre-Dialysis Findings:  +-----------------------+----------+--------------------+----------+-------  -+  Location        PSV (cm/s)Intralum. Diam. (cm)Waveform   Comments  +-----------------------+----------+--------------------+----------+-------  -+  Brachial Antecub. [id number]    0.48        triphasic        +-----------------------+----------+--------------------+----------+-------  -+  Radial Art at Wrist  124    0.28        monophasic       +-----------------------+----------+--------------------+----------+-------  -+  Ulnar Art at Wrist   85    0.15        triphasic        +-----------------------+----------+--------------------+----------+-------  -+   ----------------+-------------+----------+---------+  Right Cephalic  Diameter (cm)Depth (cm)Findings   +-----------------+-------------+----------+---------+  Shoulder       0.35               +-----------------+-------------+----------+---------+  Prox upper arm    0.43               +-----------------+-------------+----------+---------+  Mid upper arm   0.56 / 0.49             +-----------------+-------------+----------+---------+  Dist upper arm    0.45        branching   +-----------------+-------------+----------+---------+  Antecubital fossa  0.64               +-----------------+-------------+----------+---------+  Prox forearm    0.41 /  0.60       joins   +-----------------+-------------+----------+---------+  Mid forearm     0.35        branching  +-----------------+-------------+----------+---------+  Dist forearm     0.29               +-----------------+-------------+----------+---------+   +-----------------+-------------+----------+-----------+  Right Basilic  Diameter (cm)Depth (cm) Findings   +-----------------+-------------+----------+-----------+  Prox upper arm    0.43        prox to mid  +-----------------+-------------+----------+-----------+  Mid upper arm    0.38                +-----------------+-------------+----------+-----------+  Dist upper arm    0.31                +-----------------+-------------+----------+-----------+  Antecubital fossa  0.39                +-----------------+-------------+----------+-----------+  Prox forearm     0.38                +-----------------+-------------+----------+-----------+   +-----------------+-------------+----------+--------+  Left Cephalic  Diameter (cm)Depth (cm)Findings  +-----------------+-------------+----------+--------+  Shoulder       0.21              +-----------------+-------------+----------+--------+  Prox upper arm    0.27              +-----------------+-------------+----------+--------+  Mid upper arm   0.31 / 0.35            +-----------------+-------------+----------+--------+  Dist upper arm   0.45 / 0.27       valves   +-----------------+-------------+----------+--------+  Antecubital fossa 0.36 / 0.49             +-----------------+-------------+----------+--------+  Prox forearm     0.32              +-----------------+-------------+----------+--------+  Mid forearm     0.27              +-----------------+-------------+----------+--------+  Dist forearm     0.20              +-----------------+-------------+----------+--------+   +-----------------+-------------+----------+---------+  Left Basilic   Diameter (cm)Depth (cm)Findings   +-----------------+-------------+----------+---------+  Prox upper arm   0.36 / 0.49      branching  +-----------------+-------------+----------+---------+  Mid upper arm   0.35 / 0.35             +-----------------+-------------+----------+---------+  Dist upper arm    0.32               +-----------------+-------------+----------+---------+  Antecubital fossa  0.23        branching  +-----------------+-------------+----------+---------+  Prox forearm    0.21 / 0.19             +-----------------+-------------+----------+---------+    Assessment/Plan:     65 year old female with renal artery stenosis as well as advanced chronic kidney disease.  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  She is left-hand dominant we will plan for right upper extremity access in the near future.  She does appear to have suitable cephalic and basilic vein on the right for access creation.  We discussed the risk benefits alternatives she demonstrates good understanding we will get her scheduled in the near future.     Waynetta Sandy MD [facility]

## 2020-09-18 NOTE — Progress Notes (Signed)
 Patient ID: Patricia Mata, female   DOB: 07/10/1956, 65 y.o.   MRN: [id number]  Reason for Consult: New Patient (Initial Visit)   Referred by Moore, Janece, FNP  Subjective:     HPI:  Patricia Mata is a 65 y.o. female previous history of acute renal failure believes that she has been on dialysis temporarily via catheter.  Also had previous colon cancer with a right-sided port in place.  No other breast or upper extremity surgery.  Not currently on dialysis.  Denies any blood thinners.  She is left-hand dominant.  She was hospitalized with fluid overload treated with Lasix.  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  She denies any recent weight loss.  Past Medical History:  Diagnosis Date  . Acute renal failure (ARF) (HCC) 06/08/2018  . Cancer (HCC) 2008   Colon   . Chronic diastolic (congestive) heart failure (HCC)   . Diabetes mellitus without complication (HCC)   . Gout 09/08/2018  . Hypertension   . Macrocytic anemia   . MDS (myelodysplastic syndrome) (HCC)   . Stage 4 chronic kidney disease (HCC)    Family History  Problem Relation Age of Onset  . Hypertension Mother   . Diabetes Mother   . Cervical cancer Mother   . Heart attack Father   . Hypertension Sister   . Hypertension Brother   . Hypertension Sister   . Hypertension Sister   . Prostate cancer Brother   . HIV/AIDS Brother    Past Surgical History:  Procedure Laterality Date  . COLON SURGERY    . DEBRIDMENT OF DECUBITUS ULCER N/A 06/12/2018   Procedure: DEBRIDMENT OF DECUBITUS ULCER;  Surgeon: Dillingham, Claire S, DO;  Location: [facility];  Service: Plastics;  Laterality: N/A;  . ESOPHAGOGASTRODUODENOSCOPY N/A 09/01/2020   Procedure: ESOPHAGOGASTRODUODENOSCOPY (EGD);  Surgeon: Magod, Marc, MD;  Location: [facility];  Service: Endoscopy;  Laterality: N/A;  . GIVENS CAPSULE STUDY N/A 09/01/2020   Procedure: GIVENS CAPSULE STUDY;  Surgeon: Magod, Marc, MD;   Location: [facility];  Service: Endoscopy;  Laterality: N/A;  . IR FLUORO GUIDE PORT INSERTION RIGHT  04/02/2017  . IR REMOVAL TUN ACCESS W/ PORT W/O FL MOD SED  03/16/2019  . IR US GUIDE VASC ACCESS RIGHT  04/02/2017  . RIGHT HEART CATH N/A 04/23/2019   Procedure: RIGHT HEART CATH;  Surgeon: Bensimhon, Daniel R, MD;  Location: [facility];  Service: Cardiovascular;  Laterality: N/A;  . RIGHT/LEFT HEART CATH AND CORONARY ANGIOGRAPHY N/A 05/21/2018   Procedure: RIGHT/LEFT HEART CATH AND CORONARY ANGIOGRAPHY;  Surgeon: End, Christopher, MD;  Location: [facility];  Service: Cardiovascular;  Laterality: N/A;    Short Social History:  Social History   Tobacco Use  . Smoking status: Former Smoker    Packs/day: 0.25    Years: 20.00    Pack years: 5.00    Types: Cigarettes    Quit date: 01/31/2001    Years since quitting: 19.6  . Smokeless tobacco: Former User  Substance Use Topics  . Alcohol use: Yes    Alcohol/week: 2.0 standard drinks    Types: 2 Shots of liquor per week    Comment: occ    Allergies  Allergen Reactions  . Ancef [Cefazolin] Itching    Severe itching- after procedure, ancef was the antibiotic.-04/02/17 Tolerates penicillins  . Lactose Intolerance (Gi) Diarrhea  . Sulfa Antibiotics Rash and Other (See Comments)    Blisters, also      Current Outpatient Medications  Medication Sig Dispense Refill  . allopurinol (ZYLOPRIM) 300 MG tablet Take 300 mg by mouth daily.    . amiodarone (PACERONE) 200 MG tablet Take 1 tablet (200 mg total) by mouth daily. 90 tablet 3  . atorvastatin (LIPITOR) 40 MG tablet Take 1 tablet by mouth once daily (Patient taking differently: Take 40 mg by mouth daily.) 90 tablet 0  . blood glucose meter kit and supplies Dispense based on patient and insurance preference. Use up to four times daily as directed. (FOR ICD-10 E10.9, E11.9). 1 each 3  . Blood Glucose Monitoring Suppl (ONETOUCH ULTRALINK) w/Device KIT Check blood sugars twice  daily E11.9 1 kit 0  . carvedilol (COREG) 6.25 MG tablet Take 1 tablet (6.25 mg total) by mouth 2 (two) times daily. 60 tablet 0  . cloNIDine (CATAPRES) 0.1 MG tablet Take 1 tablet (0.1 mg total) by mouth 2 (two) times daily. 60 tablet 11  . fluticasone (FLONASE) 50 MCG/ACT nasal spray Place 1 spray into both nostrils daily. 16 g 2  . furosemide (LASIX) 40 MG tablet Take 1 tablet (40 mg total) by mouth daily. Take 80 mg in AM and 40 mg in PM (4PM) 90 tablet 0  . glucose blood test strip check blood sugars twice daily Dx code E11.22 100 each 3  . hydrALAZINE (APRESOLINE) 100 MG tablet TAKE 1 TABLET BY MOUTH THREE TIMES DAILY (Patient taking differently: Take 100 mg by mouth 3 (three) times daily.) 90 tablet 0  . Insulin Glargine (BASAGLAR KWIKPEN) 100 UNIT/ML Inject 20 Units into the skin daily. 15 mL 1  . Insulin Pen Needle 29G X 5MM MISC Use as directed 200 each 0  . Lancets (ONETOUCH DELICA PLUS LANCET30G) MISC check blood sugars twice daily Dx code: E11.22 100 each 3  . latanoprost (XALATAN) 0.005 % ophthalmic solution Place 1 drop into both eyes at bedtime.     . levothyroxine (SYNTHROID) 150 MCG tablet Take 1 tablet (150 mcg total) by mouth daily before breakfast. 30 tablet 2  . linagliptin (TRADJENTA) 5 MG TABS tablet Take 1 tablet (5 mg total) by mouth daily. 90 tablet 1  . loratadine (CLARITIN) 10 MG tablet Take 10 mg by mouth daily.    . Multiple Vitamins-Minerals (CENTRUM SILVER 50+WOMEN) TABS Take 1 tablet by mouth daily.    . omeprazole (PRILOSEC) 40 MG capsule Take 1 capsule (40 mg total) by mouth 2 (two) times daily before a meal. 60 capsule 1  . sildenafil (REVATIO) 20 MG tablet Take 1 tablet (20 mg total) by mouth 3 (three) times daily. 90 tablet 3  . sodium bicarbonate 650 MG tablet Take 650 mg by mouth 2 (two) times daily.    . sodium chloride (OCEAN) 0.65 % nasal spray Place 1 spray into the nose in the morning, at noon, in the evening, and at bedtime.     No current  facility-administered medications for this visit.    Review of Systems  Constitutional:  Constitutional negative. HENT: HENT negative.  Eyes: Eyes negative.  Respiratory: Positive for cough.  Cardiovascular: Cardiovascular negative.  GI: Gastrointestinal negative.  Neurological: Neurological negative. Psychiatric: Psychiatric negative.        Objective:  Objective   Vitals:   09/18/20 0917  BP: (!) 147/72  Pulse: 63  Resp: 20  Temp: 98.4 F (36.9 C)  SpO2: 95%  Weight: 205 lb (93 kg)  Height: 5' 4" (1.626 m)   Body mass index is 35.19 kg/m.  Physical Exam   HENT:     Head: Normocephalic.     Nose:     Comments: Wearing a mask Eyes:     Pupils: Pupils are equal, round, and reactive to light.  Cardiovascular:     Rate and Rhythm: Normal rate.     Pulses:          Radial pulses are 2+ on the right side and 2+ on the left side.  Pulmonary:     Effort: Pulmonary effort is normal.  Abdominal:     General: Abdomen is flat.     Palpations: Abdomen is soft.  Musculoskeletal:        General: No swelling. Normal range of motion.  Skin:    General: Skin is warm and dry.     Capillary Refill: Capillary refill takes less than 2 seconds.  Neurological:     General: No focal deficit present.     Mental Status: She is alert.  Psychiatric:        Mood and Affect: Mood normal.        Behavior: Behavior normal.        Thought Content: Thought content normal.        Judgment: Judgment normal.     Data: CT IMPRESSION: 1. No CT evidence for acute intra-abdominal or pelvic abnormality. 2. Cardiomegaly. Differential cardiac blood pool likely corresponds to history of anemia. 3. Calcified uterine fibroids.       Right Pre-Dialysis Findings:  +-----------------------+----------+--------------------+----------+-------  -+  Location        PSV (cm/s)Intralum. Diam. (cm)Waveform   Comments   +-----------------------+----------+--------------------+----------+-------  -+  Brachial Antecub. fossa114    0.44        triphasic        +-----------------------+----------+--------------------+----------+-------  -+  Radial Art at Wrist  119    0.35        monophasic       +-----------------------+----------+--------------------+----------+-------  -+  Ulnar Art at Wrist   105    0.22        monophasic       +-----------------------+----------+--------------------+----------+-------  -+    Left Pre-Dialysis Findings:  +-----------------------+----------+--------------------+----------+-------  -+  Location        PSV (cm/s)Intralum. Diam. (cm)Waveform   Comments  +-----------------------+----------+--------------------+----------+-------  -+  Brachial Antecub. fossa120    0.48        triphasic        +-----------------------+----------+--------------------+----------+-------  -+  Radial Art at Wrist  124    0.28        monophasic       +-----------------------+----------+--------------------+----------+-------  -+  Ulnar Art at Wrist   85    0.15        triphasic        +-----------------------+----------+--------------------+----------+-------  -+   ----------------+-------------+----------+---------+  Right Cephalic  Diameter (cm)Depth (cm)Findings   +-----------------+-------------+----------+---------+  Shoulder       0.35               +-----------------+-------------+----------+---------+  Prox upper arm    0.43               +-----------------+-------------+----------+---------+  Mid upper arm   0.56 / 0.49             +-----------------+-------------+----------+---------+  Dist upper arm    0.45        branching   +-----------------+-------------+----------+---------+  Antecubital fossa  0.64               +-----------------+-------------+----------+---------+  Prox forearm    0.41 /   0.60       joins   +-----------------+-------------+----------+---------+  Mid forearm     0.35        branching  +-----------------+-------------+----------+---------+  Dist forearm     0.29               +-----------------+-------------+----------+---------+   +-----------------+-------------+----------+-----------+  Right Basilic  Diameter (cm)Depth (cm) Findings   +-----------------+-------------+----------+-----------+  Prox upper arm    0.43        prox to mid  +-----------------+-------------+----------+-----------+  Mid upper arm    0.38                +-----------------+-------------+----------+-----------+  Dist upper arm    0.31                +-----------------+-------------+----------+-----------+  Antecubital fossa  0.39                +-----------------+-------------+----------+-----------+  Prox forearm     0.38                +-----------------+-------------+----------+-----------+   +-----------------+-------------+----------+--------+  Left Cephalic  Diameter (cm)Depth (cm)Findings  +-----------------+-------------+----------+--------+  Shoulder       0.21              +-----------------+-------------+----------+--------+  Prox upper arm    0.27              +-----------------+-------------+----------+--------+  Mid upper arm   0.31 / 0.35            +-----------------+-------------+----------+--------+  Dist upper arm   0.45 / 0.27       valves   +-----------------+-------------+----------+--------+  Antecubital fossa 0.36 / 0.49             +-----------------+-------------+----------+--------+  Prox forearm     0.32              +-----------------+-------------+----------+--------+  Mid forearm     0.27              +-----------------+-------------+----------+--------+  Dist forearm     0.20              +-----------------+-------------+----------+--------+   +-----------------+-------------+----------+---------+  Left Basilic   Diameter (cm)Depth (cm)Findings   +-----------------+-------------+----------+---------+  Prox upper arm   0.36 / 0.49      branching  +-----------------+-------------+----------+---------+  Mid upper arm   0.35 / 0.35             +-----------------+-------------+----------+---------+  Dist upper arm    0.32               +-----------------+-------------+----------+---------+  Antecubital fossa  0.23        branching  +-----------------+-------------+----------+---------+  Prox forearm    0.21 / 0.19             +-----------------+-------------+----------+---------+    Assessment/Plan:     65 year old female with renal artery stenosis as well as advanced chronic kidney disease.  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  She is left-hand dominant we will plan for right upper extremity access in the near future.  She does appear to have suitable cephalic and basilic vein on the right for access creation.  We discussed the risk benefits alternatives she demonstrates good understanding we will get her scheduled in the near future.     Waynetta Sandy MD [facility]

## 2020-09-21 ENCOUNTER — Inpatient Hospital Stay: Payer: Medicare Other | Attending: Oncology

## 2020-09-21 ENCOUNTER — Other Ambulatory Visit: Payer: Self-pay

## 2020-09-21 ENCOUNTER — Telehealth: Payer: Self-pay

## 2020-09-21 ENCOUNTER — Telehealth: Payer: Self-pay | Admitting: Oncology

## 2020-09-21 DIAGNOSIS — D469 Myelodysplastic syndrome, unspecified: Secondary | ICD-10-CM | POA: Insufficient documentation

## 2020-09-21 DIAGNOSIS — D649 Anemia, unspecified: Secondary | ICD-10-CM

## 2020-09-21 DIAGNOSIS — I350 Nonrheumatic aortic (valve) stenosis: Secondary | ICD-10-CM | POA: Insufficient documentation

## 2020-09-21 LAB — CBC WITH DIFFERENTIAL (CANCER CENTER ONLY)
Abs Immature Granulocytes: 0.03 10*3/uL (ref 0.00–0.07)
Basophils Absolute: 0 10*3/uL (ref 0.0–0.1)
Basophils Relative: 0 %
Eosinophils Absolute: 0 10*3/uL (ref 0.0–0.5)
Eosinophils Relative: 1 %
HCT: 18 % — ABNORMAL LOW (ref 36.0–46.0)
Hemoglobin: 6 g/dL — CL (ref 12.0–15.0)
Immature Granulocytes: 1 %
Lymphocytes Relative: 10 %
Lymphs Abs: 0.5 10*3/uL — ABNORMAL LOW (ref 0.7–4.0)
MCH: 31.3 pg (ref 26.0–34.0)
MCHC: 33.3 g/dL (ref 30.0–36.0)
MCV: 93.8 fL (ref 80.0–100.0)
Monocytes Absolute: 0.8 10*3/uL (ref 0.1–1.0)
Monocytes Relative: 15 %
Neutro Abs: 3.6 10*3/uL (ref 1.7–7.7)
Neutrophils Relative %: 73 %
Platelet Count: 197 10*3/uL (ref 150–400)
RBC: 1.92 MIL/uL — ABNORMAL LOW (ref 3.87–5.11)
RDW: 19.3 % — ABNORMAL HIGH (ref 11.5–15.5)
WBC Count: 4.9 10*3/uL (ref 4.0–10.5)
nRBC: 1.2 % — ABNORMAL HIGH (ref 0.0–0.2)

## 2020-09-21 LAB — CMP (CANCER CENTER ONLY)
ALT: 34 U/L (ref 0–44)
AST: 47 U/L — ABNORMAL HIGH (ref 15–41)
Albumin: 3.6 g/dL (ref 3.5–5.0)
Alkaline Phosphatase: 126 U/L (ref 38–126)
Anion gap: 9 (ref 5–15)
BUN: 91 mg/dL — ABNORMAL HIGH (ref 8–23)
CO2: 25 mmol/L (ref 22–32)
Calcium: 9.4 mg/dL (ref 8.9–10.3)
Chloride: 103 mmol/L (ref 98–111)
Creatinine: 3.31 mg/dL (ref 0.44–1.00)
GFR, Estimated: 15 mL/min — ABNORMAL LOW (ref >60)
Glucose, Bld: 146 mg/dL — ABNORMAL HIGH (ref 70–99)
Potassium: 4 mmol/L (ref 3.5–5.1)
Sodium: 137 mmol/L (ref 135–145)
Total Bilirubin: 0.8 mg/dL (ref 0.3–1.2)
Total Protein: 7.1 g/dL (ref 6.5–8.1)

## 2020-09-21 LAB — PREPARE RBC (CROSSMATCH)

## 2020-09-21 LAB — SAMPLE TO BLOOD BANK

## 2020-09-21 NOTE — Chronic Care Management (AMB) (Signed)
Chronic Care Management Pharmacy Assistant   Name: Reyah Streeter  MRN: [id number] DOB: 1955-12-12   Reason for Encounter: Diabetes Adherence Call    Recent office visits:  09/18/20-Cain, Georgia Dom, MD ([place]). 09/11/20-Alvstad, Casimiro Needle, RPH-CPP ([place]). 09/05/20-Moore, [name], [place] ([place]).  Recent consult visits:  None.  Hospital visits:  None in previous 6 months   Medication Changes indicated: Carvedilol 12.5 MG tablet (decreased to 6.25 MG, 1 tablet by mouth two times daily). Basaglar KwikPen 20 units Subcutaneous Daily (increased from 14 units Subcutaneous Daily).  Medications: Outpatient Encounter Medications as of 09/21/2020  Medication Sig  . allopurinol (ZYLOPRIM) 300 MG tablet Take 300 mg by mouth daily.  Marland Kitchen amiodarone (PACERONE) 200 MG tablet Take 1 tablet (200 mg total) by mouth daily.  Marland Kitchen atorvastatin (LIPITOR) 40 MG tablet Take 1 tablet by mouth once daily (Patient taking differently: Take 40 mg by mouth daily.)  . blood glucose meter kit and supplies Dispense based on patient and insurance preference. Use up to four times daily as directed. (FOR ICD-10 E10.9, E11.9).  Marland Kitchen Blood Glucose Monitoring Suppl (ONETOUCH ULTRALINK) w/Device KIT Check blood sugars twice daily E11.9  . carvedilol (COREG) 6.25 MG tablet Take 1 tablet (6.25 mg total) by mouth 2 (two) times daily.  . cloNIDine (CATAPRES) 0.1 MG tablet Take 1 tablet (0.1 mg total) by mouth 2 (two) times daily.  . fluticasone (FLONASE) 50 MCG/ACT nasal spray Place 1 spray into both nostrils daily.  . furosemide (LASIX) 40 MG tablet Take 1 tablet (40 mg total) by mouth daily. Take 80 mg in AM and 40 mg in PM (4PM)  . glucose blood test strip check blood sugars twice daily Dx code E11.22  . hydrALAZINE (APRESOLINE) 100 MG tablet TAKE 1 TABLET BY MOUTH THREE TIMES DAILY (Patient taking differently: Take 100 mg by mouth 3 (three) times daily.)  . Insulin Glargine (BASAGLAR KWIKPEN) 100 UNIT/ML Inject 20 Units into  the skin daily.  . Insulin Pen Needle 29G X 5MM MISC Use as directed  . Lancets (ONETOUCH DELICA PLUS [id number]) MISC check blood sugars twice daily Dx code: E11.22  . latanoprost (XALATAN) 0.005 % ophthalmic solution Place 1 drop into both eyes at bedtime.   Marland Kitchen levothyroxine (SYNTHROID) 150 MCG tablet Take 1 tablet (150 mcg total) by mouth daily before breakfast.  . linagliptin (TRADJENTA) 5 MG TABS tablet Take 1 tablet (5 mg total) by mouth daily.  Marland Kitchen loratadine (CLARITIN) 10 MG tablet Take 10 mg by mouth daily.  . Multiple Vitamins-Minerals (CENTRUM SILVER 50+WOMEN) TABS Take 1 tablet by mouth daily.  Marland Kitchen omeprazole (PRILOSEC) 40 MG capsule Take 1 capsule (40 mg total) by mouth 2 (two) times daily before a meal.  . sildenafil (REVATIO) 20 MG tablet Take 1 tablet (20 mg total) by mouth 3 (three) times daily.  . sodium bicarbonate 650 MG tablet Take 650 mg by mouth 2 (two) times daily.  . sodium chloride (OCEAN) 0.65 % nasal spray Place 1 spray into the nose in the morning, at noon, in the evening, and at bedtime.   No facility-administered encounter medications on file as of 09/21/2020.   Recent Relevant Labs: Lab Results  Component Value Date/Time   HGBA1C 6.0 (H) 08/10/2020 04:01 PM   HGBA1C 6.0 (H) 05/11/2020 12:32 PM   MICROALBUR 80 06/22/2019 12:02 PM    Kidney Function Lab Results  Component Value Date/Time   CREATININE 3.31 (HH) 09/21/2020 09:49 AM   CREATININE 3.37 (H) 09/05/2020 12:53 PM  CREATININE 4.20 (H) 09/02/2020 01:30 AM   CREATININE 5.01 (HH) 08/31/2020 10:51 AM   CREATININE 2.24 (H) 01/27/2019 01:55 PM   CREATININE 2.48 (H) 09/08/2018 01:15 PM   CREATININE 1.4 (H) 05/26/2017 09:49 AM   CREATININE 1.7 (H) 05/09/2017 09:41 AM   GFRNONAA 15 (L) 09/21/2020 09:49 AM   GFRNONAA 22 (L) 01/27/2019 01:55 PM   GFRAA 16 (L) 09/05/2020 12:53 PM   GFRAA 20 (L) 04/07/2020 11:14 AM   GFRAA 26 (L) 01/27/2019 01:55 PM    . Current antihyperglycemic regimen:  o Basaglar KwikPen  20 units subcutaneous daily.  . What recent interventions/DTPs have been made to improve glycemic control:  o Patient reports she is taking her medications as directed and checking her blood sugars twice daily everyday.  . Have there been any recent hospitalizations or ED visits since last visit with CPP? No   . Patient denies hypoglycemic symptoms, including Pale, Sweaty, Shaky, Hungry, Nervous/irritable and Vision changes   . Patient denies hyperglycemic symptoms, including blurry vision, excessive thirst, fatigue, polyuria and weakness   . How often are you checking your blood sugar? twice daily, in the morning before eating or drinking and at bedtime   . What are your blood sugars ranging?  o Fasting: None o Before meals: None  o After meals: None o Bedtime: None  . During the week, how often does your blood glucose drop below 70? Never   . Are you checking your feet daily/regularly? Patient stated she is checking feet daily.  Adherence Review: Is the patient currently on a STATIN medication? Yes Is the patient currently on ACE/ARB medication? No Does the patient have >5 day gap between last estimated fill dates? No   Star Rating Drugs: Atorvastatin 40MG tablet: #90 DS, last filled on 08/09/20 at [facility] .  09/21/20-Attempted to outreach the patient to speak regarding her blood sugars, no answer, left a voicemail.  09/22/20-Attempted to outreach the patient regarding her blood sugar monitoring. No answer, left a voicemail with a call back number.  2:59 PM The patient returned phone call but the patient canceled the call after I said "hello". 3:01 PM Called the patient back and was able to speak with her regarding her blood sugar monitoring. The patient was able to answer a few questions except provide blood sugar readings at this time. The patient voiced she was not around her blood sugar readings log and requested a call back. I told the patient I will  give her a call back as requested. The patient voiced understanding.  09/26/20-Called the patient back to request blood sugar reading from our previous call. No answer, left a voicemail.  Orlando Penner, CPP Notified.  Raynelle Highland, [place] Pharmacist Assistant [phone] CCM Total Time: 44 minutes

## 2020-09-21 NOTE — Telephone Encounter (Addendum)
Per Dr. Alen Blew, okay for patient to wait till Saturday for 2 units PRBC.  Patient agreeable with this and she knows to come in on Saturday 3/12 at 0800 for 2 units PRBC. Patient also knows to keep her Blue Blood Bracelet on until after her blood transfusion on Saturday.  Per Dr. Alen Blew; Tylenol 650 mg po, Benadryl 25mg  po and 40 mg Lasix order. Please give Lasix 20 mg after each unit.

## 2020-09-21 NOTE — Telephone Encounter (Signed)
CRITICAL VALUE STICKER  CRITICAL VALUE: Hgb 6.0  RECEIVER (on-site recipient of call): Evette Georges, [place] NOTIFIED: 09/21/20, 10:12 am  MESSENGER (representative from lab): Ulice Dash  MD NOTIFIED: Dr. Alen Blew  TIME OF NOTIFICATION: 10:14 am  RESPONSE: Critical lab given to Alfonse Flavors, RN to give to Dr. Alen Blew for further instructions

## 2020-09-21 NOTE — Telephone Encounter (Signed)
Scheduled blood transfusion appointment per 3/10 schedule message. Patient is aware.

## 2020-09-22 ENCOUNTER — Telehealth: Payer: Self-pay | Admitting: Oncology

## 2020-09-22 NOTE — Telephone Encounter (Signed)
Scheduled injection appointment per 3/11 schedule message. Patient is aware.

## 2020-09-23 ENCOUNTER — Encounter (HOSPITAL_COMMUNITY): Payer: Self-pay | Admitting: Vascular Surgery

## 2020-09-23 ENCOUNTER — Other Ambulatory Visit: Payer: Self-pay

## 2020-09-23 ENCOUNTER — Inpatient Hospital Stay: Payer: Medicare Other

## 2020-09-23 DIAGNOSIS — D469 Myelodysplastic syndrome, unspecified: Secondary | ICD-10-CM | POA: Diagnosis not present

## 2020-09-23 DIAGNOSIS — D649 Anemia, unspecified: Secondary | ICD-10-CM

## 2020-09-23 MED ORDER — ACETAMINOPHEN 325 MG PO TABS
650.0000 mg | ORAL_TABLET | Freq: Once | ORAL | Status: AC
  Administered 2020-09-23: 650 mg via ORAL

## 2020-09-23 MED ORDER — DIPHENHYDRAMINE HCL 25 MG PO CAPS
25.0000 mg | ORAL_CAPSULE | Freq: Once | ORAL | Status: AC
  Administered 2020-09-23: 25 mg via ORAL

## 2020-09-23 MED ORDER — FUROSEMIDE 10 MG/ML IJ SOLN
20.0000 mg | Freq: Once | INTRAMUSCULAR | Status: DC
  Administered 2020-09-23: 20 mg via INTRAMUSCULAR

## 2020-09-23 MED ORDER — SODIUM CHLORIDE 0.9% IV SOLUTION
250.0000 mL | Freq: Once | INTRAVENOUS | Status: AC
  Administered 2020-09-23: 250 mL via INTRAVENOUS
  Filled 2020-09-23: qty 250

## 2020-09-23 NOTE — Progress Notes (Signed)
Completed Pre-op phone call. Pt. Stated she received 2 units of blood this morning for anemia. States she goes to the [facility] every 3 weeks and they check her blood and usually has to receive blood. States she feels good.  PT. Reports she is  Type 2 diabetic. Fasting sugars 130-200. Last A1C several weeks ago 7.0. Instructed to take 1/2 of the basaglar insulin the evening prior to surgery and  the morning of surgery. Med. Rec. Not completed by pharmacy. Instructed pt. To check blood sugars every 2 hours prior to surgery once she wakes up. If sugar < 70 not to take any insulin and drink 1/2 cup juice (cranberry/apple) and recheck blood sugar in 15 mins and if not up to call Short Stay for further instructions. Pt. Verbalized understanding of these instructions.

## 2020-09-23 NOTE — Patient Instructions (Signed)

## 2020-09-24 LAB — BPAM RBC
Blood Product Expiration Date: 202204042359
Blood Product Expiration Date: 202204072359
ISSUE DATE / TIME: 202203120813
ISSUE DATE / TIME: 202203120813
Unit Type and Rh: 600
Unit Type and Rh: 600

## 2020-09-24 LAB — TYPE AND SCREEN
ABO/RH(D): A NEG
Antibody Screen: NEGATIVE
Unit division: 0
Unit division: 0

## 2020-09-25 ENCOUNTER — Other Ambulatory Visit (HOSPITAL_COMMUNITY)
Admission: RE | Admit: 2020-09-25 | Discharge: 2020-09-25 | Disposition: A | Discharge status: Home / Self Care | Payer: Medicare Other | Source: Ambulatory Visit | Attending: Vascular Surgery | Admitting: Vascular Surgery

## 2020-09-25 DIAGNOSIS — Z01812 Encounter for preprocedural laboratory examination: Secondary | ICD-10-CM | POA: Insufficient documentation

## 2020-09-25 DIAGNOSIS — Z20822 Contact with and (suspected) exposure to covid-19: Secondary | ICD-10-CM | POA: Diagnosis not present

## 2020-09-25 LAB — SARS CORONAVIRUS 2 (TAT 6-24 HRS): SARS Coronavirus 2: NEGATIVE

## 2020-09-25 NOTE — Anesthesia Preprocedure Evaluation (Addendum)
Anesthesia Evaluation  Patient identified by MRN, date of birth, ID band Patient awake    Reviewed: Allergy & Precautions, NPO status , Patient's Chart, lab work & pertinent test results, reviewed documented beta blocker date and time   Airway Mallampati: II  TM Distance: >3 FB Neck ROM: Full    Dental  (+) Teeth Intact, Dental Advisory Given   Pulmonary former smoker,    Pulmonary exam normal breath sounds clear to auscultation       Cardiovascular hypertension, Pt. on medications and Pt. on home beta blockers +CHF  + dysrhythmias Atrial Fibrillation + Valvular Problems/Murmurs AS and MR  Rhythm:Regular Rate:Normal + Systolic murmurs Echo 81/27/51: 1. Left ventricular ejection fraction, by estimation, is 60 to 65%. The  left ventricle has normal function. The left ventricle has no regional  wall motion abnormalities. There is moderate concentric left ventricular  hypertrophy. Left ventricular  diastolic parameters are consistent with Grade II diastolic dysfunction  (pseudonormalization). Elevated left ventricular end-diastolic pressure.  2. Right ventricular systolic function is normal. The right ventricular  size is normal. There is moderately elevated pulmonary artery systolic  pressure. The estimated right ventricular systolic pressure is 70.0 mmHg.  3. Left atrial size was moderately dilated.  4. Right atrial size was severely dilated.  5. The mitral valve is rheumatic. Mild mitral valve regurgitation.  Moderate mitral stenosis. The mean mitral valve gradient is 10.0 mmHg.  Severe mitral annular calcification.  6. Tricuspid valve regurgitation is mild to moderate.  7. The aortic valve is calcified. Aortic valve regurgitation is trivial.  Severe aortic valve stenosis. Aortic valve area, by VTI measures 0.84 cm.  Aortic valve Vmax measures 4.07 m/s.  8. The inferior vena cava is dilated in size with <50% respiratory   variability, suggesting right atrial pressure of 15 mmHg.    Neuro/Psych negative neurological ROS  negative psych ROS   GI/Hepatic Neg liver ROS, GERD  Medicated,  Endo/Other  diabetes, Type 2, Insulin Dependent, Oral Hypoglycemic AgentsHypothyroidism   Renal/GU ARF and Renal InsufficiencyRenal diseaseObesity      Musculoskeletal negative musculoskeletal ROS (+)   Abdominal   Peds  Hematology  (+) Blood dyscrasia, anemia ,   Anesthesia Other Findings   Reproductive/Obstetrics                            Anesthesia Physical Anesthesia Plan  ASA: IV  Anesthesia Plan: MAC and Regional   Post-op Pain Management:    Induction: Intravenous  PONV Risk Score and Plan: 3 and Dexamethasone, Ondansetron and Midazolam  Airway Management Planned: Natural Airway and Nasal Cannula  Additional Equipment:   Intra-op Plan:   Post-operative Plan:   Informed Consent: I have reviewed the patients History and Physical, chart, labs and discussed the procedure including the risks, benefits and alternatives for the proposed anesthesia with the patient or authorized representative who has indicated his/her understanding and acceptance.     Dental advisory given  Plan Discussed with: CRNA  Anesthesia Plan Comments:        Anesthesia Quick Evaluation

## 2020-09-26 ENCOUNTER — Other Ambulatory Visit: Payer: Self-pay

## 2020-09-26 ENCOUNTER — Encounter (HOSPITAL_COMMUNITY): Payer: Self-pay | Admitting: Vascular Surgery

## 2020-09-26 ENCOUNTER — Encounter (HOSPITAL_COMMUNITY)
Admission: RE | Disposition: A | Discharge status: Home / Self Care | Payer: Self-pay | Source: Home / Self Care | Attending: Vascular Surgery

## 2020-09-26 ENCOUNTER — Ambulatory Visit: Payer: Medicare Other

## 2020-09-26 ENCOUNTER — Ambulatory Visit (HOSPITAL_COMMUNITY): Payer: Medicare Other | Admitting: Certified Registered Nurse Anesthetist

## 2020-09-26 ENCOUNTER — Ambulatory Visit (HOSPITAL_COMMUNITY)
Admission: RE | Admit: 2020-09-26 | Discharge: 2020-09-26 | Disposition: A | Discharge status: Home / Self Care | Payer: Medicare Other | Attending: Vascular Surgery | Admitting: Vascular Surgery

## 2020-09-26 DIAGNOSIS — Z79890 Hormone replacement therapy (postmenopausal): Secondary | ICD-10-CM | POA: Insufficient documentation

## 2020-09-26 DIAGNOSIS — I5032 Chronic diastolic (congestive) heart failure: Secondary | ICD-10-CM | POA: Insufficient documentation

## 2020-09-26 DIAGNOSIS — N185 Chronic kidney disease, stage 5: Secondary | ICD-10-CM | POA: Diagnosis not present

## 2020-09-26 DIAGNOSIS — E1122 Type 2 diabetes mellitus with diabetic chronic kidney disease: Secondary | ICD-10-CM | POA: Insufficient documentation

## 2020-09-26 DIAGNOSIS — Z794 Long term (current) use of insulin: Secondary | ICD-10-CM | POA: Insufficient documentation

## 2020-09-26 DIAGNOSIS — Z79899 Other long term (current) drug therapy: Secondary | ICD-10-CM | POA: Insufficient documentation

## 2020-09-26 DIAGNOSIS — Z87891 Personal history of nicotine dependence: Secondary | ICD-10-CM | POA: Diagnosis not present

## 2020-09-26 DIAGNOSIS — I130 Hypertensive heart and chronic kidney disease with heart failure and stage 1 through stage 4 chronic kidney disease, or unspecified chronic kidney disease: Secondary | ICD-10-CM | POA: Diagnosis present

## 2020-09-26 DIAGNOSIS — Z882 Allergy status to sulfonamides status: Secondary | ICD-10-CM | POA: Insufficient documentation

## 2020-09-26 DIAGNOSIS — Z7984 Long term (current) use of oral hypoglycemic drugs: Secondary | ICD-10-CM | POA: Insufficient documentation

## 2020-09-26 DIAGNOSIS — Z91011 Allergy to milk products: Secondary | ICD-10-CM | POA: Insufficient documentation

## 2020-09-26 DIAGNOSIS — N184 Chronic kidney disease, stage 4 (severe): Secondary | ICD-10-CM | POA: Diagnosis not present

## 2020-09-26 HISTORY — DX: Hypothyroidism, unspecified: E03.9

## 2020-09-26 HISTORY — PX: AV FISTULA PLACEMENT: SHX1204

## 2020-09-26 LAB — POCT I-STAT, CHEM 8
BUN: 127 mg/dL — ABNORMAL HIGH (ref 8–23)
Calcium, Ion: 1.19 mmol/L (ref 1.15–1.40)
Chloride: 97 mmol/L — ABNORMAL LOW (ref 98–111)
Creatinine, Ser: 3.8 mg/dL — ABNORMAL HIGH (ref 0.44–1.00)
Glucose, Bld: 113 mg/dL — ABNORMAL HIGH (ref 70–99)
HCT: 22 % — ABNORMAL LOW (ref 36.0–46.0)
Hemoglobin: 7.5 g/dL — ABNORMAL LOW (ref 12.0–15.0)
Potassium: 3.3 mmol/L — ABNORMAL LOW (ref 3.5–5.1)
Sodium: 136 mmol/L (ref 135–145)
TCO2: 24 mmol/L (ref 22–32)

## 2020-09-26 LAB — GLUCOSE, CAPILLARY
Glucose-Capillary: 119 mg/dL — ABNORMAL HIGH (ref 70–99)
Glucose-Capillary: 138 mg/dL — ABNORMAL HIGH (ref 70–99)

## 2020-09-26 SURGERY — ARTERIOVENOUS (AV) FISTULA CREATION
Anesthesia: Monitor Anesthesia Care | Site: Arm Upper | Laterality: Right

## 2020-09-26 MED ORDER — FENTANYL CITRATE (PF) 250 MCG/5ML IJ SOLN
INTRAMUSCULAR | Status: AC
  Filled 2020-09-26: qty 5

## 2020-09-26 MED ORDER — PROPOFOL 10 MG/ML IV BOLUS
INTRAVENOUS | Status: AC
  Filled 2020-09-26: qty 40

## 2020-09-26 MED ORDER — ACETAMINOPHEN 500 MG PO TABS
1000.0000 mg | ORAL_TABLET | Freq: Once | ORAL | Status: AC
  Administered 2020-09-26: 1000 mg via ORAL
  Filled 2020-09-26: qty 2

## 2020-09-26 MED ORDER — VANCOMYCIN HCL IN DEXTROSE 1-5 GM/200ML-% IV SOLN
1000.0000 mg | INTRAVENOUS | Status: AC
  Administered 2020-09-26: 1000 mg via INTRAVENOUS
  Filled 2020-09-26: qty 200

## 2020-09-26 MED ORDER — LIDOCAINE-EPINEPHRINE (PF) 1.5 %-1:200000 IJ SOLN
INTRAMUSCULAR | Status: DC | PRN
  Administered 2020-09-26: 30 mL via PERINEURAL

## 2020-09-26 MED ORDER — CHLORHEXIDINE GLUCONATE 4 % EX LIQD: 60.0000 mL | Freq: Once | CUTANEOUS | Status: DC

## 2020-09-26 MED ORDER — MIDAZOLAM HCL 2 MG/2ML IJ SOLN
INTRAMUSCULAR | Status: AC
  Filled 2020-09-26: qty 2

## 2020-09-26 MED ORDER — CHLORHEXIDINE GLUCONATE 0.12 % MT SOLN
15.0000 mL | Freq: Once | OROMUCOSAL | Status: AC
  Administered 2020-09-26: 15 mL via OROMUCOSAL
  Filled 2020-09-26: qty 15

## 2020-09-26 MED ORDER — SODIUM CHLORIDE 0.9 % IV SOLN
INTRAVENOUS | Status: DC | PRN
  Administered 2020-09-26: 500 mL

## 2020-09-26 MED ORDER — MIDAZOLAM HCL 5 MG/5ML IJ SOLN
INTRAMUSCULAR | Status: DC | PRN
  Administered 2020-09-26: 2 mg via INTRAVENOUS

## 2020-09-26 MED ORDER — SODIUM CHLORIDE 0.9 % IV SOLN
INTRAVENOUS | Status: AC
  Filled 2020-09-26: qty 1.2

## 2020-09-26 MED ORDER — OXYCODONE-ACETAMINOPHEN 5-325 MG PO TABS: 1.0000 | ORAL_TABLET | ORAL | 0 refills | Status: DC | PRN

## 2020-09-26 MED ORDER — LIDOCAINE-EPINEPHRINE (PF) 1 %-1:200000 IJ SOLN
INTRAMUSCULAR | Status: AC
  Filled 2020-09-26: qty 30

## 2020-09-26 MED ORDER — ONDANSETRON HCL 4 MG/2ML IJ SOLN
INTRAMUSCULAR | Status: DC | PRN
  Administered 2020-09-26: 4 mg via INTRAVENOUS

## 2020-09-26 MED ORDER — SODIUM CHLORIDE 0.9 % IV SOLN: INTRAVENOUS | Status: DC

## 2020-09-26 MED ORDER — ORAL CARE MOUTH RINSE: 15.0000 mL | Freq: Once | OROMUCOSAL | Status: AC

## 2020-09-26 MED ORDER — LIDOCAINE HCL (PF) 1 % IJ SOLN
INTRAMUSCULAR | Status: AC
  Filled 2020-09-26: qty 30

## 2020-09-26 MED ORDER — PROPOFOL 500 MG/50ML IV EMUL
INTRAVENOUS | Status: DC | PRN
  Administered 2020-09-26: 50 ug/kg/min via INTRAVENOUS

## 2020-09-26 MED ORDER — 0.9 % SODIUM CHLORIDE (POUR BTL) OPTIME
TOPICAL | Status: DC | PRN
  Administered 2020-09-26: 1000 mL

## 2020-09-26 SURGICAL SUPPLY — 29 items
ADH SKN CLS APL DERMABOND .7 (GAUZE/BANDAGES/DRESSINGS) ×1
ARMBAND PINK RESTRICT EXTREMIT (MISCELLANEOUS) ×3 IMPLANT
CANISTER SUCT 3000ML PPV (MISCELLANEOUS) ×3 IMPLANT
CLIP VESOCCLUDE MED 6/CT (CLIP) ×3 IMPLANT
CLIP VESOCCLUDE SM WIDE 6/CT (CLIP) ×5 IMPLANT
COVER PROBE W GEL 5X96 (DRAPES) IMPLANT
COVER WAND RF STERILE (DRAPES) ×3 IMPLANT
DERMABOND ADVANCED (GAUZE/BANDAGES/DRESSINGS) ×2
DERMABOND ADVANCED .7 DNX12 (GAUZE/BANDAGES/DRESSINGS) ×1 IMPLANT
ELECT REM PT RETURN 9FT ADLT (ELECTROSURGICAL) ×3
ELECTRODE REM PT RTRN 9FT ADLT (ELECTROSURGICAL) ×1 IMPLANT
GLOVE BIO SURGEON STRL SZ7.5 (GLOVE) ×3 IMPLANT
GOWN STRL REUS W/ TWL LRG LVL3 (GOWN DISPOSABLE) ×2 IMPLANT
GOWN STRL REUS W/ TWL XL LVL3 (GOWN DISPOSABLE) ×1 IMPLANT
GOWN STRL REUS W/TWL LRG LVL3 (GOWN DISPOSABLE) ×6
GOWN STRL REUS W/TWL XL LVL3 (GOWN DISPOSABLE) ×3
INSERT FOGARTY SM (MISCELLANEOUS) IMPLANT
KIT BASIN OR (CUSTOM PROCEDURE TRAY) ×3 IMPLANT
KIT TURNOVER KIT B (KITS) ×3 IMPLANT
NS IRRIG 1000ML POUR BTL (IV SOLUTION) ×3 IMPLANT
PACK CV ACCESS (CUSTOM PROCEDURE TRAY) ×3 IMPLANT
PAD ARMBOARD 7.5X6 YLW CONV (MISCELLANEOUS) ×6 IMPLANT
SUT MNCRL AB 4-0 PS2 18 (SUTURE) ×3 IMPLANT
SUT PROLENE 6 0 BV (SUTURE) ×3 IMPLANT
SUT VIC AB 3-0 SH 27 (SUTURE) ×3
SUT VIC AB 3-0 SH 27X BRD (SUTURE) ×1 IMPLANT
TOWEL GREEN STERILE (TOWEL DISPOSABLE) ×3 IMPLANT
UNDERPAD 30X36 HEAVY ABSORB (UNDERPADS AND DIAPERS) ×3 IMPLANT
WATER STERILE IRR 1000ML POUR (IV SOLUTION) ×3 IMPLANT

## 2020-09-26 NOTE — Anesthesia Procedure Notes (Signed)
Anesthesia Regional Block: Supraclavicular block   Pre-Anesthetic Checklist: ,, timeout performed, Correct Patient, Correct Site, Correct Laterality, Correct Procedure, Correct Position, site marked, Risks and benefits discussed,  Surgical consent,  Pre-op evaluation,  At surgeon's request and post-op pain management  Laterality: Right  Prep: chloraprep       Needles:  Injection technique: Single-shot  Needle Type: Echogenic Needle     Needle Length: 9cm  Needle Gauge: 21     Additional Needles:   Procedures:,,,, ultrasound used (permanent image in chart),,,,  Narrative:  Start time: 09/26/2020 7:20 AM End time: 09/26/2020 7:25 AM Injection made incrementally with aspirations every 5 mL.  Performed by: Personally  Anesthesiologist: Catalina Gravel, MD  Additional Notes: No pain on injection. No increased resistance to injection. Injection made in 5cc increments.  Good needle visualization.  Patient tolerated procedure well.

## 2020-09-26 NOTE — Transfer of Care (Signed)
Immediate Anesthesia Transfer of Care Note  Patient: Patricia Mata  Procedure(s) Performed: RIGHT ARM ARTERIOVENOUS (AV) FISTULA CREATION (Right Arm Upper)  Patient Location: PACU  Anesthesia Type:MAC  Level of Consciousness: awake, patient cooperative and responds to stimulation  Airway & Oxygen Therapy: Patient Spontanous Breathing and Patient connected to face mask oxygen  Post-op Assessment: Report given to RN and Post -op Vital signs reviewed and stable  Post vital signs: Reviewed and stable  Last Vitals:  Vitals Value Taken Time  BP 150/48 09/26/20 0839  Temp    Pulse 59 09/26/20 0839  Resp 14 09/26/20 0839  SpO2 100 % 09/26/20 0839  Vitals shown include unvalidated device data.  Last Pain:  Vitals:   09/26/20 0635  TempSrc:   PainSc: 0-No pain         Complications: No complications documented.

## 2020-09-26 NOTE — Op Note (Signed)
    Patient name: Patricia Mata MRN: [id number] DOB: 1955-09-16 Sex: female  09/26/2020 Pre-operative Diagnosis: ckd Post-operative diagnosis:  Same Surgeon:  Erlene Quan C. Donzetta Matters, MD Procedure Performed: Right arm brachial artery to cephalic vein av fistula creation  Indications: 65 year old female with chronic kidney disease.  She is now indicated for permanent dialysis access appears to have suitable vein in the right upper extremity.  Findings: Cephalic vein in the right upper extremity measured approximately 4 mm diameter and was free of disease.  The brachial artery was 3 mm diameter.  At completion there was a very strong thrill in the fistula confirmed with Doppler and a palpable radial artery pulse there was also a good signal in the radial and ulnar pulses at the wrist that did augment with compression of the fistula.   Procedure:  The patient was identified in the holding area and taken to the operating where she is placed supine operative table and sterilely prepped and draped in the usual fashion.  A preoperative block of been placed.  A timeout was called antibiotics administered.  The block was checked and noted to be sufficient.  Ultrasound was used we identified of suitable cephalic vein as well as basilic vein in the upper extremity on the right and the brachial artery.  Transverse incision was made between the brachial artery and the cephalic vein.  The cephalic vein was dissected free branches were divided between clips and ties.  It was marked for orientation.  We dissected through the deep fascia to the brachial artery and a vessel loop was placed around this.  We then clamped the vein distally transected and tied it off with 2-0 silk suture.  We spatulated the vein flushed with heparinized saline and clamped it.  We then clamped the artery distally proximally opened longitudinally flushed heparinized saline only distally given the small size of the artery.  We then sewed the vein  and soft 6-0 Prolene suture.  Prior completion without flushing all directions.  Upon completion we had very strong thrill in the fistula.  We did free up some of the soft tissue using cautery and scissors.  Fistula maintained a strong thrill we were able to trace it with Doppler throughout the upper extremity.  There was a palpable radial artery pulse the wrist this was confirmed with Doppler there was also a good ulnar artery signal at the wrist that did augment with compression of the fistula.  We then irrigated the wound and obtain hemostasis.  The wound was closed in layers of Vicryl and Monocryl.  Dermabond was placed at the level of skin.  She was awakened from anesthesia having tolerated procedure well any complication.  All counts were correct at completion.   EBL: 20cc  Kanai Berrios C. Donzetta Matters, MD [facility] Office: [phone] Pager: [phone]

## 2020-09-26 NOTE — Anesthesia Postprocedure Evaluation (Signed)
Anesthesia Post Note  Patient: Patricia Mata  Procedure(s) Performed: RIGHT ARM ARTERIOVENOUS (AV) FISTULA CREATION (Right Arm Upper)     Patient location during evaluation: PACU Anesthesia Type: Regional Level of consciousness: awake and alert and awake Pain management: pain level controlled Vital Signs Assessment: post-procedure vital signs reviewed and stable Respiratory status: spontaneous breathing, nonlabored ventilation, respiratory function stable and patient connected to nasal cannula oxygen Cardiovascular status: stable and blood pressure returned to baseline Postop Assessment: no apparent nausea or vomiting Anesthetic complications: no   No complications documented.  Last Vitals:  Vitals:   09/26/20 0940 09/26/20 0955  BP: (!) 146/49 (!) 150/49  Pulse:  (!) 55  Resp: 14 14  Temp:  (!) 36.4 C  SpO2:  100%    Last Pain:  Vitals:   09/26/20 0930  TempSrc:   PainSc: Asleep                 Catalina Gravel

## 2020-09-26 NOTE — Interval H&P Note (Signed)
History and Physical Interval Note:  09/26/2020 7:29 AM  Patricia Mata  has presented today for surgery, with the diagnosis of Chronic Kidney Disease.  The various methods of treatment have been discussed with the patient and family. After consideration of risks, benefits and other options for treatment, the patient has consented to  Procedure(s): RIGHT ARM ARTERIOVENOUS (AV) FISTULA CREATION VERSUS GRAFT PLACEMENT (Right) as a surgical intervention.  The patient's history has been reviewed, patient examined, no change in status, stable for surgery.  I have reviewed the patient's chart and labs.  Questions were answered to the patient's satisfaction.     Servando Snare

## 2020-09-27 ENCOUNTER — Other Ambulatory Visit: Payer: Self-pay

## 2020-09-27 ENCOUNTER — Encounter (HOSPITAL_COMMUNITY): Payer: Self-pay | Admitting: Vascular Surgery

## 2020-09-27 ENCOUNTER — Other Ambulatory Visit: Payer: Self-pay | Admitting: Cardiovascular Disease

## 2020-09-27 MED ORDER — OMEPRAZOLE 40 MG PO CPDR: 40.0000 mg | DELAYED_RELEASE_CAPSULE | Freq: Two times a day (BID) | ORAL | 1 refills | Status: DC

## 2020-09-28 ENCOUNTER — Other Ambulatory Visit: Payer: Self-pay

## 2020-09-28 ENCOUNTER — Telehealth: Payer: Self-pay | Admitting: Oncology

## 2020-09-28 ENCOUNTER — Telehealth: Payer: Self-pay

## 2020-09-28 ENCOUNTER — Inpatient Hospital Stay: Payer: Medicare Other

## 2020-09-28 VITALS — BP 140/55 | HR 60 | Resp 16

## 2020-09-28 DIAGNOSIS — D469 Myelodysplastic syndrome, unspecified: Secondary | ICD-10-CM

## 2020-09-28 DIAGNOSIS — D649 Anemia, unspecified: Secondary | ICD-10-CM

## 2020-09-28 LAB — CBC WITH DIFFERENTIAL (CANCER CENTER ONLY)
Abs Immature Granulocytes: 0.02 10*3/uL (ref 0.00–0.07)
Basophils Absolute: 0 10*3/uL (ref 0.0–0.1)
Basophils Relative: 0 %
Eosinophils Absolute: 0 10*3/uL (ref 0.0–0.5)
Eosinophils Relative: 0 %
HCT: 17.7 % — ABNORMAL LOW (ref 36.0–46.0)
Hemoglobin: 6 g/dL — CL (ref 12.0–15.0)
Immature Granulocytes: 0 %
Lymphocytes Relative: 9 %
Lymphs Abs: 0.4 10*3/uL — ABNORMAL LOW (ref 0.7–4.0)
MCH: 31.1 pg (ref 26.0–34.0)
MCHC: 33.9 g/dL (ref 30.0–36.0)
MCV: 91.7 fL (ref 80.0–100.0)
Monocytes Absolute: 0.9 10*3/uL (ref 0.1–1.0)
Monocytes Relative: 20 %
Neutro Abs: 3.1 10*3/uL (ref 1.7–7.7)
Neutrophils Relative %: 71 %
Platelet Count: 145 10*3/uL — ABNORMAL LOW (ref 150–400)
RBC: 1.93 MIL/uL — ABNORMAL LOW (ref 3.87–5.11)
RDW: 19.1 % — ABNORMAL HIGH (ref 11.5–15.5)
WBC Count: 4.5 10*3/uL (ref 4.0–10.5)
nRBC: 2.2 % — ABNORMAL HIGH (ref 0.0–0.2)

## 2020-09-28 LAB — CMP (CANCER CENTER ONLY)
ALT: 82 U/L — ABNORMAL HIGH (ref 0–44)
AST: 104 U/L — ABNORMAL HIGH (ref 15–41)
Albumin: 3.4 g/dL — ABNORMAL LOW (ref 3.5–5.0)
Alkaline Phosphatase: 155 U/L — ABNORMAL HIGH (ref 38–126)
Anion gap: 9 (ref 5–15)
BUN: 112 mg/dL — ABNORMAL HIGH (ref 8–23)
CO2: 26 mmol/L (ref 22–32)
Calcium: 9 mg/dL (ref 8.9–10.3)
Chloride: 97 mmol/L — ABNORMAL LOW (ref 98–111)
Creatinine: 4.79 mg/dL (ref 0.44–1.00)
GFR, Estimated: 10 mL/min — ABNORMAL LOW (ref >60)
Glucose, Bld: 162 mg/dL — ABNORMAL HIGH (ref 70–99)
Potassium: 4.6 mmol/L (ref 3.5–5.1)
Sodium: 132 mmol/L — ABNORMAL LOW (ref 135–145)
Total Bilirubin: 0.9 mg/dL (ref 0.3–1.2)
Total Protein: 6.5 g/dL (ref 6.5–8.1)

## 2020-09-28 LAB — SAMPLE TO BLOOD BANK

## 2020-09-28 LAB — PREPARE RBC (CROSSMATCH)

## 2020-09-28 MED ORDER — LUSPATERCEPT-AAMT 75 MG ~~LOC~~ SOLR
1.7500 mg/kg | Freq: Once | SUBCUTANEOUS | Status: AC
  Administered 2020-09-28: 150 mg via SUBCUTANEOUS
  Filled 2020-09-28: qty 3

## 2020-09-28 NOTE — Progress Notes (Signed)
I contacted blood bank to confirm that they could see the orders for the patient to receive 2 units PRBC tomorrow. Ester in blood bank was able to confirm that she could see the orders. Patient was advised by Murlean Hark, LPN to leave the blue blood bank bracelet on.

## 2020-09-28 NOTE — Patient Instructions (Signed)
Luspatercept injection What is this medicine? LUSPATERCEPT (lus PAT er sept) helps your body make more red blood cells. This medicine is used to treat anemia caused by beta thalassemia or myelodysplastic syndromes. This medicine may be used for other purposes; ask your health care provider or pharmacist if you have questions. COMMON BRAND NAME(S): REBLOZYL What should I tell my health care provider before I take this medicine? They need to know if you have any of these conditions:  cigarette smoker  have had your spleen removed  high blood pressure  history of blood clots  an unusual or allergic reaction to luspatercept, other medicines, foods, dyes or preservatives  pregnant or trying to get pregnant  breast-feeding How should I use this medicine? This medicine is for injection under the skin. It is given by a healthcare professional in a hospital or clinic setting. Talk to your pediatrician about the use of the medicine in children. This medicine is not approved for use in children. Overdosage: If you think you have taken too much of this medicine contact a poison control center or emergency room at once. NOTE: This medicine is only for you. Do not share this medicine with others. What if I miss a dose? Keep appointments for follow-up doses. It is important not to miss your dose. Call your doctor or healthcare professional if you are unable to keep an appointment. What may interact with this medicine? Interactions are not expected. This list may not describe all possible interactions. Give your health care provider a list of all the medicines, herbs, non-prescription drugs, or dietary supplements you use. Also tell them if you smoke, drink alcohol, or use illegal drugs. Some items may interact with your medicine. What should I watch for while using this medicine? Your condition will be monitored carefully while you are receiving this medicine. Do not become pregnant while taking  this medicine or for 3 months after stopping it. Women should inform their healthcare professional if they wish to become pregnant or think they might be pregnant. There is a potential for serious side effects and harm to an unborn child. Talk to your healthcare professional for more information. Do not breast-feed an infant while taking this medicine. You may need blood work done while you are taking this medicine. What side effects may I notice from receiving this medicine? Side effects that you should report to your doctor or health care professional as soon as possible:  allergic reactions like skin rash, itching or hives; swelling of the face, lips, or tongue  signs and symptoms of a blood clot such as chest pain; shortness of breath; pain, swelling, or warmth in the leg  signs and symptoms of a stroke like changes in vision; confusion; trouble speaking or understanding; severe headaches; sudden numbness or weakness of the face, arm or leg; trouble walking; dizziness; loss of balance or coordination Side effects that usually do not require medical attention (report these to your doctor or health care professional if they continue or are bothersome):  cough  diarrhea  dizziness  headache  joint pain  stomach pain  tiredness This list may not describe all possible side effects. Call your doctor for medical advice about side effects. You may report side effects to FDA at 1-800-FDA-1088. Where should I keep my medicine? This medicine is given in a hospital or clinic and will not be stored at home. NOTE: This sheet is a summary. It may not cover all possible information. If you have questions about   this medicine, talk to your doctor, pharmacist, or health care provider.  2021 Elsevier/Gold Standard (2018-10-20 15:57:59)

## 2020-09-28 NOTE — Telephone Encounter (Signed)
CRITICAL VALUE STICKER  CRITICAL VALUE: Hgb = 6.0  RECEIVER (on-site recipient of call): Yetta Glassman, CMA  DATE & TIME NOTIFIED: 09/28/20 at 2:14pm  MESSENGER (representative from lab): Lelan Pons  MD NOTIFIED: Dr. Alen Blew  TIME OF NOTIFICATION: 09/28/20 at 2:14pm  RESPONSE: Notification given to Evette Georges, [place] for follow-up with provider.

## 2020-09-28 NOTE — Telephone Encounter (Signed)
Scheduled per 03/17 scheduled message, patient should be aware of 03/18 appointments.

## 2020-09-28 NOTE — Telephone Encounter (Signed)
CRITICAL VALUE STICKER  CRITICAL VALUE: Creatinine = 4.79  RECEIVER (on-site recipient of call): Yetta Glassman, [place] NOTIFIED: 09/28/20 at 2:54pm  MESSENGER (representative from lab): Ulice Dash  MD NOTIFIED: Dr. Alen Blew  TIME OF NOTIFICATION: 09/28/20 at 2:57pm  RESPONSE: Notification given directly to Dr. Alen Blew for follow-up with pt.

## 2020-09-28 NOTE — Progress Notes (Signed)
Patient denies any SOB, fatigue, dizziness or heart palpations. Made her aware of transfusion on 09/29/2020.

## 2020-09-29 ENCOUNTER — Inpatient Hospital Stay (HOSPITAL_BASED_OUTPATIENT_CLINIC_OR_DEPARTMENT_OTHER): Payer: Medicare Other | Admitting: Medical

## 2020-09-29 ENCOUNTER — Inpatient Hospital Stay: Payer: Medicare Other

## 2020-09-29 DIAGNOSIS — D469 Myelodysplastic syndrome, unspecified: Secondary | ICD-10-CM

## 2020-09-29 DIAGNOSIS — R040 Epistaxis: Secondary | ICD-10-CM

## 2020-09-29 MED ORDER — ACETAMINOPHEN 325 MG PO TABS
650.0000 mg | ORAL_TABLET | Freq: Once | ORAL | Status: AC
  Administered 2020-09-29: 650 mg via ORAL

## 2020-09-29 MED ORDER — SODIUM CHLORIDE 0.9% IV SOLUTION
250.0000 mL | Freq: Once | INTRAVENOUS | Status: DC
  Filled 2020-09-29: qty 250

## 2020-09-29 MED ORDER — DIPHENHYDRAMINE HCL 25 MG PO CAPS
ORAL_CAPSULE | ORAL | Status: AC
  Filled 2020-09-29: qty 1

## 2020-09-29 MED ORDER — FUROSEMIDE 10 MG/ML IJ SOLN
INTRAMUSCULAR | Status: AC
  Filled 2020-09-29: qty 2

## 2020-09-29 MED ORDER — ACETAMINOPHEN 325 MG PO TABS
ORAL_TABLET | ORAL | Status: AC
  Filled 2020-09-29: qty 2

## 2020-09-29 MED ORDER — DIPHENHYDRAMINE HCL 25 MG PO CAPS
25.0000 mg | ORAL_CAPSULE | Freq: Once | ORAL | Status: AC
  Administered 2020-09-29: 25 mg via ORAL

## 2020-09-29 MED ORDER — FUROSEMIDE 10 MG/ML IJ SOLN
20.0000 mg | Freq: Once | INTRAMUSCULAR | Status: AC
  Administered 2020-09-29: 20 mg via INTRAVENOUS

## 2020-09-29 NOTE — Patient Instructions (Signed)

## 2020-09-29 NOTE — Progress Notes (Signed)
Patient brought back here in w/c by front desk. Patient stated her nose started to bleed at home and it wouldn't stop. Patient  noted to have put tissues in nose and inside of her mask. Tissues in mask with copious amounts of blood. Blood noted to on patient's hands and on her shirt. Patient given more tissues back in infusion room, ice, and educated to lean head forward and keep the bridge of her nose pinched. Patient stated she has been having nose bleeds about once every week and has had to go to the emergency room for them as well. Patient stated she has been using afrin nasal spray at home to try and help with the bleeding, but it has not been successful.   Patient scheduled her for 2 units of blood today due to hgb of 6.0 yesterday from lab work. Dr. Alen Blew notified of patient's status  and patient put on Sandi Mealy, PA's schedule to be seen today.   Patient's bleeding has decreased while here, but still continues. Patient spit up a clot of blood at 9:37am today. Patient stated, "When I spit up a clot, that usually means it's getting better." Patient denies nausea and that she did not vomit clot up. Patient educated to make sure she leans head forward and not backwards to prevent any blood going in her stomach which can cause nausea and vomiting.   Patient in private room in bed with head of bed all the way up,call light within reach. PIV in LFA with NS at [facility] Pre-meds started and blood bank called and verified patient's blood is ready.    Patient's nose stopped bleeding proximately one hour into first unit of blood being transfused. Patient had no more episodes of bleeding while here. Patient completed 2 units of blood with no complications. PIV removed, tip intact.   Patient educated by PA during visit to seek medical treatment if her nose begins to bleed again uncontrollably. Also educated patient to speak to a ENT to follow up r/t frequent nose bleeds.

## 2020-09-29 NOTE — Progress Notes (Signed)
The patient was seen in infusion as she was receiving a transfusion today.  Her labs from today showed hemoglobin of 6.0.  This was down from 7.5 when last checked on 09/26/2020.  She was seen as she has been having episodic nosebleeds.  She reports that a nosebleed earlier this week lasted for around 8 hours.  Her nosebleed today has lasted several hours. She has been seen by EENT and has been using Afrin as needed.  She reports the Ativan has not been completely beneficial.  She was told that she needs to return to the emergency room should she have a recurrence of nosebleeds.  She is agreeable to this.  She reports that she is considering finding another EENT to see.  A brief physical exam showed no evidence of blood in the nares.  Sandi Mealy, MHS, PA-C Physician Assistant

## 2020-09-30 LAB — BPAM RBC
Blood Product Expiration Date: 202203272359
Blood Product Expiration Date: 202204122359
ISSUE DATE / TIME: 202203180943
ISSUE DATE / TIME: 202203180943
Unit Type and Rh: 600
Unit Type and Rh: 600

## 2020-09-30 LAB — TYPE AND SCREEN
ABO/RH(D): A NEG
Antibody Screen: NEGATIVE
Unit division: 0
Unit division: 0

## 2020-10-01 ENCOUNTER — Telehealth: Payer: Self-pay | Admitting: Medical

## 2020-10-01 NOTE — Telephone Encounter (Signed)
Checked out appointment. No LOS notes needing to be scheduled. No changes made. 

## 2020-10-02 ENCOUNTER — Other Ambulatory Visit (HOSPITAL_COMMUNITY): Payer: Self-pay | Admitting: *Deleted

## 2020-10-04 ENCOUNTER — Other Ambulatory Visit (HOSPITAL_COMMUNITY): Payer: Self-pay | Admitting: *Deleted

## 2020-10-04 MED ORDER — CARVEDILOL 6.25 MG PO TABS: 6.1250 mg | ORAL_TABLET | Freq: Two times a day (BID) | ORAL | 3 refills | Status: DC

## 2020-10-05 ENCOUNTER — Telehealth: Payer: Self-pay

## 2020-10-05 NOTE — Telephone Encounter (Signed)
Spoke with pt about samples being placed up front. Pt is going to come by at her earliest convenience for the samples. She was given enough for 2 weeks being that was all the office had at the moment.

## 2020-10-09 ENCOUNTER — Inpatient Hospital Stay (HOSPITAL_COMMUNITY)
Admission: EM | Admit: 2020-10-09 | Discharge: 2020-10-16 | DRG: 280 | Disposition: A | Discharge status: Home / Self Care | Payer: Medicare Other | Attending: Internal Medicine | Admitting: Internal Medicine

## 2020-10-09 ENCOUNTER — Other Ambulatory Visit: Payer: Self-pay

## 2020-10-09 ENCOUNTER — Inpatient Hospital Stay (HOSPITAL_COMMUNITY): Payer: Medicare Other

## 2020-10-09 ENCOUNTER — Encounter (HOSPITAL_COMMUNITY): Payer: Self-pay | Admitting: *Deleted

## 2020-10-09 ENCOUNTER — Emergency Department (HOSPITAL_COMMUNITY): Payer: Medicare Other

## 2020-10-09 DIAGNOSIS — D469 Myelodysplastic syndrome, unspecified: Secondary | ICD-10-CM | POA: Diagnosis present

## 2020-10-09 DIAGNOSIS — I132 Hypertensive heart and chronic kidney disease with heart failure and with stage 5 chronic kidney disease, or end stage renal disease: Secondary | ICD-10-CM | POA: Diagnosis present

## 2020-10-09 DIAGNOSIS — Z87891 Personal history of nicotine dependence: Secondary | ICD-10-CM

## 2020-10-09 DIAGNOSIS — E872 Acidosis: Secondary | ICD-10-CM | POA: Diagnosis present

## 2020-10-09 DIAGNOSIS — E785 Hyperlipidemia, unspecified: Secondary | ICD-10-CM | POA: Diagnosis present

## 2020-10-09 DIAGNOSIS — D631 Anemia in chronic kidney disease: Secondary | ICD-10-CM | POA: Diagnosis present

## 2020-10-09 DIAGNOSIS — Z8042 Family history of malignant neoplasm of prostate: Secondary | ICD-10-CM

## 2020-10-09 DIAGNOSIS — I208 Other forms of angina pectoris: Secondary | ICD-10-CM | POA: Diagnosis present

## 2020-10-09 DIAGNOSIS — Z830 Family history of human immunodeficiency virus [HIV] disease: Secondary | ICD-10-CM

## 2020-10-09 DIAGNOSIS — Y712 Prosthetic and other implants, materials and accessory cardiovascular devices associated with adverse incidents: Secondary | ICD-10-CM | POA: Diagnosis present

## 2020-10-09 DIAGNOSIS — Z20822 Contact with and (suspected) exposure to covid-19: Secondary | ICD-10-CM | POA: Diagnosis present

## 2020-10-09 DIAGNOSIS — I2089 Other forms of angina pectoris: Secondary | ICD-10-CM | POA: Diagnosis present

## 2020-10-09 DIAGNOSIS — Z882 Allergy status to sulfonamides status: Secondary | ICD-10-CM | POA: Diagnosis not present

## 2020-10-09 DIAGNOSIS — R778 Other specified abnormalities of plasma proteins: Secondary | ICD-10-CM | POA: Diagnosis present

## 2020-10-09 DIAGNOSIS — Z8249 Family history of ischemic heart disease and other diseases of the circulatory system: Secondary | ICD-10-CM

## 2020-10-09 DIAGNOSIS — D691 Qualitative platelet defects: Secondary | ICD-10-CM | POA: Diagnosis present

## 2020-10-09 DIAGNOSIS — Z833 Family history of diabetes mellitus: Secondary | ICD-10-CM

## 2020-10-09 DIAGNOSIS — Z8049 Family history of malignant neoplasm of other genital organs: Secondary | ICD-10-CM

## 2020-10-09 DIAGNOSIS — E1151 Type 2 diabetes mellitus with diabetic peripheral angiopathy without gangrene: Secondary | ICD-10-CM | POA: Diagnosis present

## 2020-10-09 DIAGNOSIS — I350 Nonrheumatic aortic (valve) stenosis: Secondary | ICD-10-CM

## 2020-10-09 DIAGNOSIS — I10 Essential (primary) hypertension: Secondary | ICD-10-CM | POA: Diagnosis present

## 2020-10-09 DIAGNOSIS — R7989 Other specified abnormal findings of blood chemistry: Secondary | ICD-10-CM | POA: Diagnosis present

## 2020-10-09 DIAGNOSIS — I249 Acute ischemic heart disease, unspecified: Secondary | ICD-10-CM

## 2020-10-09 DIAGNOSIS — Z794 Long term (current) use of insulin: Secondary | ICD-10-CM | POA: Diagnosis not present

## 2020-10-09 DIAGNOSIS — D649 Anemia, unspecified: Secondary | ICD-10-CM | POA: Diagnosis not present

## 2020-10-09 DIAGNOSIS — R079 Chest pain, unspecified: Secondary | ICD-10-CM

## 2020-10-09 DIAGNOSIS — Z85038 Personal history of other malignant neoplasm of large intestine: Secondary | ICD-10-CM

## 2020-10-09 DIAGNOSIS — N189 Chronic kidney disease, unspecified: Secondary | ICD-10-CM | POA: Diagnosis present

## 2020-10-09 DIAGNOSIS — I083 Combined rheumatic disorders of mitral, aortic and tricuspid valves: Secondary | ICD-10-CM | POA: Diagnosis present

## 2020-10-09 DIAGNOSIS — I209 Angina pectoris, unspecified: Secondary | ICD-10-CM

## 2020-10-09 DIAGNOSIS — I701 Atherosclerosis of renal artery: Secondary | ICD-10-CM | POA: Diagnosis present

## 2020-10-09 DIAGNOSIS — T82510A Breakdown (mechanical) of surgically created arteriovenous fistula, initial encounter: Secondary | ICD-10-CM | POA: Diagnosis present

## 2020-10-09 DIAGNOSIS — Z7982 Long term (current) use of aspirin: Secondary | ICD-10-CM

## 2020-10-09 DIAGNOSIS — I313 Pericardial effusion (noninflammatory): Secondary | ICD-10-CM

## 2020-10-09 DIAGNOSIS — I21A1 Myocardial infarction type 2: Secondary | ICD-10-CM | POA: Diagnosis not present

## 2020-10-09 DIAGNOSIS — Z8673 Personal history of transient ischemic attack (TIA), and cerebral infarction without residual deficits: Secondary | ICD-10-CM

## 2020-10-09 DIAGNOSIS — I25110 Atherosclerotic heart disease of native coronary artery with unstable angina pectoris: Secondary | ICD-10-CM | POA: Diagnosis present

## 2020-10-09 DIAGNOSIS — N186 End stage renal disease: Secondary | ICD-10-CM | POA: Diagnosis present

## 2020-10-09 DIAGNOSIS — N179 Acute kidney failure, unspecified: Secondary | ICD-10-CM | POA: Diagnosis present

## 2020-10-09 DIAGNOSIS — Z91011 Allergy to milk products: Secondary | ICD-10-CM

## 2020-10-09 DIAGNOSIS — N184 Chronic kidney disease, stage 4 (severe): Secondary | ICD-10-CM

## 2020-10-09 DIAGNOSIS — E876 Hypokalemia: Secondary | ICD-10-CM | POA: Diagnosis present

## 2020-10-09 DIAGNOSIS — E871 Hypo-osmolality and hyponatremia: Secondary | ICD-10-CM | POA: Diagnosis present

## 2020-10-09 DIAGNOSIS — N185 Chronic kidney disease, stage 5: Secondary | ICD-10-CM | POA: Diagnosis present

## 2020-10-09 DIAGNOSIS — I5032 Chronic diastolic (congestive) heart failure: Secondary | ICD-10-CM | POA: Diagnosis present

## 2020-10-09 DIAGNOSIS — I480 Paroxysmal atrial fibrillation: Secondary | ICD-10-CM | POA: Diagnosis present

## 2020-10-09 DIAGNOSIS — E039 Hypothyroidism, unspecified: Secondary | ICD-10-CM | POA: Diagnosis present

## 2020-10-09 DIAGNOSIS — E1122 Type 2 diabetes mellitus with diabetic chronic kidney disease: Secondary | ICD-10-CM | POA: Diagnosis present

## 2020-10-09 DIAGNOSIS — I2510 Atherosclerotic heart disease of native coronary artery without angina pectoris: Secondary | ICD-10-CM | POA: Diagnosis not present

## 2020-10-09 DIAGNOSIS — I214 Non-ST elevation (NSTEMI) myocardial infarction: Secondary | ICD-10-CM

## 2020-10-09 DIAGNOSIS — I272 Pulmonary hypertension, unspecified: Secondary | ICD-10-CM | POA: Diagnosis present

## 2020-10-09 DIAGNOSIS — E119 Type 2 diabetes mellitus without complications: Secondary | ICD-10-CM

## 2020-10-09 DIAGNOSIS — I779 Disorder of arteries and arterioles, unspecified: Secondary | ICD-10-CM | POA: Diagnosis present

## 2020-10-09 DIAGNOSIS — K2950 Unspecified chronic gastritis without bleeding: Secondary | ICD-10-CM | POA: Diagnosis present

## 2020-10-09 DIAGNOSIS — R040 Epistaxis: Secondary | ICD-10-CM | POA: Diagnosis present

## 2020-10-09 DIAGNOSIS — G4733 Obstructive sleep apnea (adult) (pediatric): Secondary | ICD-10-CM | POA: Diagnosis present

## 2020-10-09 DIAGNOSIS — Z888 Allergy status to other drugs, medicaments and biological substances status: Secondary | ICD-10-CM

## 2020-10-09 DIAGNOSIS — I440 Atrioventricular block, first degree: Secondary | ICD-10-CM | POA: Diagnosis present

## 2020-10-09 DIAGNOSIS — Z9049 Acquired absence of other specified parts of digestive tract: Secondary | ICD-10-CM

## 2020-10-09 HISTORY — DX: Disorder of arteries and arterioles, unspecified: I77.9

## 2020-10-09 HISTORY — DX: Type 2 diabetes mellitus without complications: E11.9

## 2020-10-09 HISTORY — DX: Atherosclerosis of renal artery: I70.1

## 2020-10-09 HISTORY — DX: Peripheral vascular disease, unspecified: I73.9

## 2020-10-09 HISTORY — DX: Paroxysmal atrial fibrillation: I48.0

## 2020-10-09 HISTORY — DX: Rheumatic mitral stenosis: I05.0

## 2020-10-09 HISTORY — DX: Anemia, unspecified: D64.9

## 2020-10-09 HISTORY — DX: Pulmonary hypertension, unspecified: I27.20

## 2020-10-09 HISTORY — DX: Nonrheumatic aortic (valve) stenosis: I35.0

## 2020-10-09 HISTORY — DX: Malignant neoplasm of colon, unspecified: C18.9

## 2020-10-09 LAB — RETICULOCYTES
Immature Retic Fract: 19.9 % — ABNORMAL HIGH (ref 2.3–15.9)
RBC.: 1.49 MIL/uL — ABNORMAL LOW (ref 3.87–5.11)
Retic Count, Absolute: 26.8 10*3/uL (ref 19.0–186.0)
Retic Ct Pct: 1.8 % (ref 0.4–3.1)

## 2020-10-09 LAB — COMPREHENSIVE METABOLIC PANEL
ALT: 75 U/L — ABNORMAL HIGH (ref 0–44)
AST: 113 U/L — ABNORMAL HIGH (ref 15–41)
Albumin: 3.1 g/dL — ABNORMAL LOW (ref 3.5–5.0)
Alkaline Phosphatase: 110 U/L (ref 38–126)
Anion gap: 14 (ref 5–15)
BUN: 123 mg/dL — ABNORMAL HIGH (ref 8–23)
CO2: 20 mmol/L — ABNORMAL LOW (ref 22–32)
Calcium: 9 mg/dL (ref 8.9–10.3)
Chloride: 98 mmol/L (ref 98–111)
Creatinine, Ser: 4.05 mg/dL — ABNORMAL HIGH (ref 0.44–1.00)
GFR, Estimated: 12 mL/min — ABNORMAL LOW (ref >60)
Glucose, Bld: 208 mg/dL — ABNORMAL HIGH (ref 70–99)
Potassium: 3.1 mmol/L — ABNORMAL LOW (ref 3.5–5.1)
Sodium: 132 mmol/L — ABNORMAL LOW (ref 135–145)
Total Bilirubin: 1 mg/dL (ref 0.3–1.2)
Total Protein: 6 g/dL — ABNORMAL LOW (ref 6.5–8.1)

## 2020-10-09 LAB — DIFFERENTIAL
Abs Immature Granulocytes: 0 10*3/uL (ref 0.00–0.07)
Band Neutrophils: 0 %
Basophils Absolute: 0 10*3/uL (ref 0.0–0.1)
Basophils Relative: 0 %
Blasts: 0 %
Eosinophils Absolute: 0 10*3/uL (ref 0.0–0.5)
Eosinophils Relative: 0 %
Lymphocytes Relative: 7 %
Lymphs Abs: 0.5 10*3/uL — ABNORMAL LOW (ref 0.7–4.0)
Metamyelocytes Relative: 0 %
Monocytes Absolute: 0.7 10*3/uL (ref 0.1–1.0)
Monocytes Relative: 10 %
Myelocytes: 0 %
Neutro Abs: 5.5 10*3/uL (ref 1.7–7.7)
Neutrophils Relative %: 83 %
Other: 0 %
Promyelocytes Relative: 0 %
nRBC: 7 /100 WBC — ABNORMAL HIGH

## 2020-10-09 LAB — ECHOCARDIOGRAM COMPLETE
AR max vel: 0.71 cm2
AV Area VTI: 0.71 cm2
AV Area mean vel: 0.7 cm2
AV Mean grad: 65 mmHg
AV Peak grad: 104.6 mmHg
Ao pk vel: 5.11 m/s
Area-P 1/2: 2.97 cm2
Height: 64 in
MV M vel: 2.47 m/s
MV Peak grad: 24.4 mmHg
MV VTI: 1.32 cm2
P 1/2 time: 247 msec
S' Lateral: 3.9 cm
Weight: 3365.1 oz

## 2020-10-09 LAB — TROPONIN I (HIGH SENSITIVITY)
Troponin I (High Sensitivity): 44 ng/L — ABNORMAL HIGH (ref <18)
Troponin I (High Sensitivity): 115 ng/L (ref <18)
Troponin I (High Sensitivity): 3163 ng/L (ref <18)
Troponin I (High Sensitivity): 3951 ng/L (ref <18)
Troponin I (High Sensitivity): 7352 ng/L (ref <18)

## 2020-10-09 LAB — URINALYSIS, ROUTINE W REFLEX MICROSCOPIC
Bilirubin Urine: NEGATIVE
Glucose, UA: NEGATIVE mg/dL
Ketones, ur: NEGATIVE mg/dL
Nitrite: NEGATIVE
Protein, ur: 100 mg/dL — AB
Specific Gravity, Urine: 1.016 (ref 1.005–1.030)
pH: 5 (ref 5.0–8.0)

## 2020-10-09 LAB — RESP PANEL BY RT-PCR (FLU A&B, COVID) ARPGX2
Influenza A by PCR: NEGATIVE
Influenza B by PCR: NEGATIVE
SARS Coronavirus 2 by RT PCR: NEGATIVE

## 2020-10-09 LAB — CBC
HCT: 14.2 % — ABNORMAL LOW (ref 36.0–46.0)
Hemoglobin: 4.6 g/dL — CL (ref 12.0–15.0)
MCH: 31.7 pg (ref 26.0–34.0)
MCHC: 32.4 g/dL (ref 30.0–36.0)
MCV: 97.9 fL (ref 80.0–100.0)
Platelets: 196 10*3/uL (ref 150–400)
RBC: 1.45 MIL/uL — ABNORMAL LOW (ref 3.87–5.11)
RDW: 18.5 % — ABNORMAL HIGH (ref 11.5–15.5)
WBC: 6.7 10*3/uL (ref 4.0–10.5)
nRBC: 4.2 % — ABNORMAL HIGH (ref 0.0–0.2)

## 2020-10-09 LAB — CBG MONITORING, ED
Glucose-Capillary: 153 mg/dL — ABNORMAL HIGH (ref 70–99)
Glucose-Capillary: 142 mg/dL — ABNORMAL HIGH (ref 70–99)

## 2020-10-09 LAB — LIPASE, BLOOD: Lipase: 32 U/L (ref 11–51)

## 2020-10-09 LAB — PREPARE RBC (CROSSMATCH)

## 2020-10-09 MED ORDER — ONDANSETRON HCL 4 MG/2ML IJ SOLN
4.0000 mg | Freq: Once | INTRAMUSCULAR | Status: AC
  Administered 2020-10-09: 4 mg via INTRAVENOUS
  Filled 2020-10-09: qty 2

## 2020-10-09 MED ORDER — DIPHENHYDRAMINE HCL 25 MG PO CAPS: 25.0000 mg | ORAL_CAPSULE | Freq: Four times a day (QID) | ORAL | Status: DC | PRN

## 2020-10-09 MED ORDER — ALLOPURINOL 300 MG PO TABS
300.0000 mg | ORAL_TABLET | Freq: Every day | ORAL | Status: DC
  Administered 2020-10-09 – 2020-10-10 (×2): 300 mg via ORAL
  Filled 2020-10-09 (×2): qty 1

## 2020-10-09 MED ORDER — HEPARIN SODIUM (PORCINE) 5000 UNIT/ML IJ SOLN: 5000.0000 [IU] | Freq: Two times a day (BID) | INTRAMUSCULAR | Status: DC

## 2020-10-09 MED ORDER — MORPHINE SULFATE (PF) 4 MG/ML IV SOLN
4.0000 mg | Freq: Once | INTRAVENOUS | Status: AC
  Administered 2020-10-09: 4 mg via INTRAVENOUS
  Filled 2020-10-09: qty 1

## 2020-10-09 MED ORDER — NITROGLYCERIN 0.4 MG SL SUBL
0.4000 mg | SUBLINGUAL_TABLET | SUBLINGUAL | Status: DC | PRN
Start: 2020-10-09 — End: 2020-10-10
  Administered 2020-10-09: 0.4 mg via SUBLINGUAL
  Filled 2020-10-09: qty 1

## 2020-10-09 MED ORDER — INSULIN ASPART 100 UNIT/ML ~~LOC~~ SOLN
0.0000 [IU] | Freq: Three times a day (TID) | SUBCUTANEOUS | Status: DC
  Administered 2020-10-09: 1 [IU] via SUBCUTANEOUS
  Administered 2020-10-10 – 2020-10-12 (×4): 2 [IU] via SUBCUTANEOUS
  Administered 2020-10-13 – 2020-10-14 (×2): 3 [IU] via SUBCUTANEOUS
  Administered 2020-10-14: 1 [IU] via SUBCUTANEOUS
  Administered 2020-10-14 – 2020-10-15 (×2): 3 [IU] via SUBCUTANEOUS

## 2020-10-09 MED ORDER — SODIUM CHLORIDE 0.9% IV SOLUTION: Freq: Once | INTRAVENOUS | Status: AC

## 2020-10-09 MED ORDER — ASPIRIN 81 MG PO CHEW
324.0000 mg | CHEWABLE_TABLET | ORAL | Status: AC
  Filled 2020-10-09: qty 4

## 2020-10-09 MED ORDER — AMIODARONE HCL 200 MG PO TABS
200.0000 mg | ORAL_TABLET | Freq: Every day | ORAL | Status: DC
  Administered 2020-10-09 – 2020-10-16 (×8): 200 mg via ORAL
  Filled 2020-10-09 (×8): qty 1

## 2020-10-09 MED ORDER — CLONIDINE HCL 0.1 MG PO TABS
0.1000 mg | ORAL_TABLET | Freq: Two times a day (BID) | ORAL | Status: DC
Start: 2020-10-09 — End: 2020-10-16
  Administered 2020-10-10 – 2020-10-16 (×11): 0.1 mg via ORAL
  Filled 2020-10-09 (×13): qty 1

## 2020-10-09 MED ORDER — ATORVASTATIN CALCIUM 40 MG PO TABS
40.0000 mg | ORAL_TABLET | Freq: Every day | ORAL | Status: DC
  Administered 2020-10-09 – 2020-10-16 (×8): 40 mg via ORAL
  Filled 2020-10-09 (×8): qty 1

## 2020-10-09 MED ORDER — SALINE SPRAY 0.65 % NA SOLN
1.0000 | NASAL | Status: DC | PRN
  Filled 2020-10-09: qty 44

## 2020-10-09 MED ORDER — HYDRALAZINE HCL 50 MG PO TABS
100.0000 mg | ORAL_TABLET | Freq: Three times a day (TID) | ORAL | Status: DC
  Administered 2020-10-10 – 2020-10-16 (×14): 100 mg via ORAL
  Filled 2020-10-09 (×18): qty 2

## 2020-10-09 MED ORDER — ACETAMINOPHEN 325 MG PO TABS
650.0000 mg | ORAL_TABLET | ORAL | Status: DC | PRN
  Administered 2020-10-11: 650 mg via ORAL
  Filled 2020-10-09: qty 2

## 2020-10-09 MED ORDER — METHOCARBAMOL 1000 MG/10ML IJ SOLN
500.0000 mg | Freq: Once | INTRAMUSCULAR | Status: DC
  Filled 2020-10-09: qty 5

## 2020-10-09 MED ORDER — LEVOTHYROXINE SODIUM 75 MCG PO TABS
150.0000 ug | ORAL_TABLET | Freq: Every day | ORAL | Status: DC
  Administered 2020-10-10 – 2020-10-16 (×7): 150 ug via ORAL
  Filled 2020-10-09 (×6): qty 2
  Filled 2020-10-09: qty 6

## 2020-10-09 MED ORDER — CARVEDILOL 6.25 MG PO TABS
6.1250 mg | ORAL_TABLET | Freq: Two times a day (BID) | ORAL | Status: DC
  Administered 2020-10-09 – 2020-10-10 (×2): 6.25 mg via ORAL
  Filled 2020-10-09 (×4): qty 1

## 2020-10-09 MED ORDER — INSULIN GLARGINE 100 UNIT/ML ~~LOC~~ SOLN
20.0000 [IU] | Freq: Every day | SUBCUTANEOUS | Status: DC
  Administered 2020-10-10 – 2020-10-16 (×6): 20 [IU] via SUBCUTANEOUS
  Filled 2020-10-09 (×8): qty 0.2

## 2020-10-09 MED ORDER — LORATADINE 10 MG PO TABS
10.0000 mg | ORAL_TABLET | ORAL | Status: DC
  Administered 2020-10-10 – 2020-10-16 (×4): 10 mg via ORAL
  Filled 2020-10-09 (×6): qty 1

## 2020-10-09 MED ORDER — PANTOPRAZOLE SODIUM 40 MG PO TBEC
40.0000 mg | DELAYED_RELEASE_TABLET | Freq: Every day | ORAL | Status: DC
  Administered 2020-10-09 – 2020-10-16 (×8): 40 mg via ORAL
  Filled 2020-10-09 (×8): qty 1

## 2020-10-09 MED ORDER — MORPHINE SULFATE (PF) 4 MG/ML IV SOLN
4.0000 mg | Freq: Once | INTRAVENOUS | Status: DC
Start: 2020-10-09 — End: 2020-10-10

## 2020-10-09 MED ORDER — HYDROMORPHONE HCL 1 MG/ML IJ SOLN
0.5000 mg | Freq: Once | INTRAMUSCULAR | Status: AC
  Administered 2020-10-09: 0.5 mg via INTRAVENOUS
  Filled 2020-10-09: qty 1

## 2020-10-09 MED ORDER — HYDROMORPHONE HCL 1 MG/ML IJ SOLN: 0.5000 mg | INTRAMUSCULAR | Status: DC | PRN

## 2020-10-09 MED ORDER — POTASSIUM CHLORIDE CRYS ER 20 MEQ PO TBCR
40.0000 meq | EXTENDED_RELEASE_TABLET | Freq: Once | ORAL | Status: AC
  Administered 2020-10-09: 40 meq via ORAL
  Filled 2020-10-09: qty 2

## 2020-10-09 MED ORDER — ONDANSETRON HCL 4 MG/2ML IJ SOLN: 4.0000 mg | Freq: Four times a day (QID) | INTRAMUSCULAR | Status: DC | PRN

## 2020-10-09 MED ORDER — FLUTICASONE PROPIONATE 50 MCG/ACT NA SUSP
1.0000 | Freq: Every day | NASAL | Status: DC
  Administered 2020-10-11 – 2020-10-16 (×4): 1 via NASAL
  Filled 2020-10-09 (×2): qty 16

## 2020-10-09 MED ORDER — LINAGLIPTIN 5 MG PO TABS
5.0000 mg | ORAL_TABLET | Freq: Every day | ORAL | Status: DC
  Administered 2020-10-09 – 2020-10-16 (×8): 5 mg via ORAL
  Filled 2020-10-09 (×8): qty 1

## 2020-10-09 MED ORDER — NITROGLYCERIN 0.4 MG SL SUBL: 0.4000 mg | SUBLINGUAL_TABLET | SUBLINGUAL | Status: DC | PRN

## 2020-10-09 MED ORDER — ASPIRIN 81 MG PO CHEW
324.0000 mg | CHEWABLE_TABLET | Freq: Once | ORAL | Status: DC
  Filled 2020-10-09: qty 4

## 2020-10-09 MED ORDER — ACETAMINOPHEN 325 MG PO TABS
650.0000 mg | ORAL_TABLET | Freq: Once | ORAL | Status: AC
  Administered 2020-10-09: 650 mg via ORAL
  Filled 2020-10-09: qty 2

## 2020-10-09 MED ORDER — FUROSEMIDE 40 MG PO TABS
40.0000 mg | ORAL_TABLET | Freq: Every day | ORAL | Status: DC
  Administered 2020-10-09 – 2020-10-16 (×8): 40 mg via ORAL
  Filled 2020-10-09 (×4): qty 1
  Filled 2020-10-09: qty 2
  Filled 2020-10-09: qty 1
  Filled 2020-10-09 (×2): qty 2

## 2020-10-09 MED ORDER — SODIUM BICARBONATE 650 MG PO TABS
650.0000 mg | ORAL_TABLET | Freq: Two times a day (BID) | ORAL | Status: DC
  Administered 2020-10-10 – 2020-10-11 (×4): 650 mg via ORAL
  Filled 2020-10-09 (×6): qty 1

## 2020-10-09 MED ORDER — DIPHENHYDRAMINE HCL 50 MG/ML IJ SOLN
12.5000 mg | Freq: Once | INTRAMUSCULAR | Status: AC
  Administered 2020-10-09: 12.5 mg via INTRAVENOUS
  Filled 2020-10-09: qty 1

## 2020-10-09 MED ORDER — LATANOPROST 0.005 % OP SOLN
1.0000 [drp] | Freq: Every day | OPHTHALMIC | Status: DC
  Administered 2020-10-10 – 2020-10-15 (×6): 1 [drp] via OPHTHALMIC
  Filled 2020-10-09 (×2): qty 2.5

## 2020-10-09 MED ORDER — FENTANYL CITRATE (PF) 100 MCG/2ML IJ SOLN
50.0000 ug | Freq: Once | INTRAMUSCULAR | Status: AC
  Administered 2020-10-09: 50 ug via INTRAVENOUS
  Filled 2020-10-09: qty 2

## 2020-10-09 MED ORDER — ASPIRIN 300 MG RE SUPP: 300.0000 mg | RECTAL | Status: AC

## 2020-10-09 MED ORDER — METHOCARBAMOL 500 MG PO TABS
500.0000 mg | ORAL_TABLET | Freq: Once | ORAL | Status: AC
  Administered 2020-10-09: 500 mg via ORAL
  Filled 2020-10-09: qty 1

## 2020-10-09 MED ORDER — SILDENAFIL CITRATE 20 MG PO TABS
20.0000 mg | ORAL_TABLET | Freq: Three times a day (TID) | ORAL | Status: DC
  Administered 2020-10-09 – 2020-10-16 (×20): 20 mg via ORAL
  Filled 2020-10-09 (×22): qty 1

## 2020-10-09 MED ORDER — HEPARIN SODIUM (PORCINE) 5000 UNIT/ML IJ SOLN: 5000.0000 [IU] | Freq: Three times a day (TID) | INTRAMUSCULAR | Status: DC

## 2020-10-09 MED ORDER — OXYCODONE-ACETAMINOPHEN 5-325 MG PO TABS
1.0000 | ORAL_TABLET | ORAL | Status: DC | PRN
  Filled 2020-10-09: qty 1

## 2020-10-09 NOTE — ED Provider Notes (Signed)
Care of the patient assumed at the change of shift. Patient here with L chest/shoulder pain radiating down her L arm. Worsening for 3 days, improved with sitting up, worse with lying down. She has CKD, but not on dialysis. She has a fistula in RUE for future dialysis needs.   Physical Exam  BP (!) 132/51 (BP Location: Left Arm)   Pulse 72   Temp 97.6 F (36.4 C) (Oral)   Resp 20   Ht 5\' 4"  (1.626 m)   Wt 95.4 kg   SpO2 98%   BMI 36.10 kg/m   Physical Exam Awake and alert Uncomfortable Tender to palpation L chest wall and L shoulder muscles Fistula on the R with palpable thrill No pain with movement of L shoulder ED Course/Procedures   Clinical Course as of 10/09/20 1451  Mon Oct 09, 2020  0839 Patient has had no improvement in pain after NTG, Morphine or Dilaudid. Will give APAP and Robaxin and reassess. Second Trop is pending.  [CS]  7588 Second troponin is more elevated 44->115. ASA ordered. Will discuss with Cardiology [CS]  1334 Spoke with Cardiology team who request hospitalist admit, they will consult. Patient reports she is having some urinary frequency. Will add UA. She reports some improvement in chest pain after urination, but is asking to 'be put to sleep' to deal with the pain. Advised this strategy is not safe for her situation but offered additional pain medications.  [CS]  3254 Spoke with Dr. [name] Locks, Hospitalist, who will evaluate for admission.  [CS]    Clinical Course User Index [CS] Truddie Hidden, MD    Procedures  MDM        Truddie Hidden, MD 10/09/20 1451

## 2020-10-09 NOTE — Progress Notes (Signed)
Hb 4.6 as compared to baseline 6.0-7.5, d/w cardiology, agreed with PRBC x3, D/C heparin.

## 2020-10-09 NOTE — Consult Note (Addendum)
Cardiology Consultation:   Patient ID: Patricia Mata MRN: [id number]; DOB: 1955/09/08  Admit date: 10/09/2020 Date of Consult: 10/09/2020  PCP:  [name], [facility]  Cardiologist:  Quay Burow, MD  Advanced Practice Provider:  No care team member to display Electrophysiologist:  None  Advanced Heart Failure Clinic:  Glori Bickers, MD       Patient Profile:   Patricia Mata is a 65 y.o. female with a hx of PAF, pulmonary HTN, moderate MS, severe AS, obesity, DM2, severe HTN, PAF, CKD stage IV-V, colon CA dx 2008 s/p hemicolectomy, myelodysplastic syndrome (dx 2017) with transfusion dependent anemia, carotid artery disease (near occlusion of RICA and possible 14-48% vs 1-85% LICA with disturbed R subclavian flow), chronic diastolic CHF, epistaxis who is being seen today for the evaluation of chest pain at the request of Dr. Karle Starch.  History of Present Illness:   [name] has a very complex medical history as outlined below. She's been followed by Drs. Gwenlyn Found, Bensimhon and Goodrich (latter for HTN clinic). The patient was remotely admitted in 2019 with AF RVR, anemia, respiratory failure requiring intubation, and AKI progressing to transient CVVHD. 2D echo showed normal LVEF with moderate AS/MS and severe pulmonary HTN. She was originally not felt to be a candidate for intervention of AS or MS given advanced PAH and transfusion-dependent myelodysplasia. She underwent [facility] 05/2018 showing normal coronaries and severely elevated PA pressures. VQ scan was negative. She was started on sildenafil and IV diuresis. She has also followed with Dr. Hans Eden requiring frequent transfusions for her severe anemia. Last RHC in 04/2019 showed mild PAH in setting of high output, likely due to anemia. She also required wound care for coccygeal osteomyelitis in 2021. Prior peripheral duplexes (both carotid and renal) are outlined below which included carotid  disease and possibly >60% right renal stenosis as well as celiac/mesenteric PAD. Dr. Gwenlyn Found was not convinced of renovascular HTN so recommended medical management. She has had multiple admissions since that time including CAP 09/2019 with VDRF (CHF possibly contributing), acute on chronic CHF in 05/2020 (with echo showing progression to severe AS), and recurrent volume overload with AKI up to 6.45. HD suggested but patient refused. She was last admitted 08/2020 with worsening anemia and +FOBT.  Patient underwent EGD and push enteroscopy and was found to have chronic gastritis but per GI that did not seem to be the source of GI bleed so patient ended up having capsule endoscopy showing abnormal proximal sb mucosa but otherwise no sign of bleeding.   Dr. Haroldine Laws has felt she may not tolerate HD well with her severe AS, so referred her to Dr. Burt Knack. (Per his note, "She has seen Dr. Alen Blew and he has made it clear that she has a terminal illness but he would have no issue with using Plavix if needed.") Dr. Burt Knack discussed all options including palliative care vs TAVR and the patient wished to proceed with moving forward with treatment. He felt it would be reasonable to pursue workup, but would require proceeding with vascular workup for AV fistula placement so that [facility] and CT studies with contrast could be performed (given risk of progression to hemodialysis). She underwent right arm brachial artery to cephalic vein AV fistula creation on 09/26/20. She recently underwent last transfusion of 2 U PRBCs (09/29/20) at which time she reported episodic epistaxis, sometimes lasting up to 8 hours - had been using Afrin at the direction of ENT. Bleeding stopped at  that time but was advised to proceed to ED if this recurred. Fortunately she states this has settled down.  She reports for the past 3 days she's had left sided chest pain (burning/stinging) radiating down that arm, initially intermittent, mostly with  recumbency/lying down at night to rest. She would have to spend the night sitting up. However, the last 24 hours it became persistent no matter what she did. It is not specifically worse with inspiration. + nausea, no fevers, chills, vomiting. She denies any increase in her chronic DOE or syncope. CXR shows cardiomegaly and vascular congestion. hsTroponin 44-115, hyponatremia of 132, hypokalemia of 3.1, BUN 123/Cr 4.05 (most recent range 3.3-4.79), albumin 3.1, AST 113, ALT 75, TProt 6.0. We sent off for stat CBC. She received ASA 320m x1 by EMS, 1 SL NTG, morphine, zofran, then Robaxin and Dilaudid. She states the latter pain medication helped bring it down from 11/10 to 9/10 but is still quite uncomfortable. Echo in progress. VSS. Tele with episodic desats earlier but currently 99% RA which also seems to correlate with majority of VS obtained.   Past Medical History:  Diagnosis Date  . Acute renal failure (ARF) ([place] 06/08/2018  . Carotid artery disease ([facility] [place]   . Chronic diastolic (congestive) heart failure ([facility]   . Colon cancer ([facility]   . Diabetes mellitus ([place]   . Gout 09/08/2018  . Hypertension   . Hypothyroidism   . Macrocytic anemia   . MDS (myelodysplastic syndrome) ([place]   . Moderate mitral stenosis   . PAD (peripheral artery disease) ([facility]   . PAF (paroxysmal atrial fibrillation) ([facility]   . Pulmonary hypertension ([facility]   . Renal artery stenosis (HCC)    a. >60% R renal artery stenosis by duplex 2021, managed medically.  . Severe aortic stenosis   . Stage 4 chronic kidney disease (H[place]   . Transfusion-dependent anemia     Past Surgical History:  Procedure Laterality Date  . AV FISTULA PLACEMENT Right 09/26/2020   Procedure: RIGHT ARM ARTERIOVENOUS (AV) FISTULA CREATION;  Surgeon: CWaynetta Sandy MD;  [facility];  Service: Vascular;  Laterality: Right;  Monitor Anesthesia Care with Regional Block to Right Arm   . COLON SURGERY    . DEBRIDMENT OF DECUBITUS ULCER  N/A 06/12/2018   Procedure: DEBRIDMENT OF DECUBITUS ULCER;  Surgeon: DWallace Going DO;  Location: [facility];  Service: Plastics;  Laterality: N/A;  . ESOPHAGOGASTRODUODENOSCOPY N/A 09/01/2020   Procedure: ESOPHAGOGASTRODUODENOSCOPY (EGD);  Surgeon: MClarene Essex MD;  [facility];  Service: Endoscopy;  Laterality: N/A;  . GIVENS CAPSULE STUDY N/A 09/01/2020   Procedure: GIVENS CAPSULE STUDY;  Surgeon: MClarene Essex MD;  Location: [facility];  Service: Endoscopy;  Laterality: N/A;  . IR FLUORO GUIDE PORT INSERTION RIGHT  04/02/2017  . IR REMOVAL TUN ACCESS W/ PORT W/O FL MOD SED  03/16/2019  . IR [place] VASC ACCESS RIGHT  04/02/2017  . RIGHT HEART CATH N/A 04/23/2019   Procedure: RIGHT HEART CATH;  Surgeon: BJolaine Artist MD;  [facility];  Service: Cardiovascular;  Laterality: N/A;  . RIGHT/LEFT HEART CATH AND CORONARY ANGIOGRAPHY N/A 05/21/2018   Procedure: RIGHT/LEFT HEART CATH AND CORONARY ANGIOGRAPHY;  Surgeon: ENelva Bush MD;  [facility];  Service: Cardiovascular;  Laterality: N/A;  . TUBAL LIGATION       Home Medications:  Prior to Admission medications   Medication Sig Start Date End Date Taking? Authorizing Provider  allopurinol (ZYLOPRIM) 300  MG tablet Take 300 mg by mouth daily. 07/05/20   [provider]  amiodarone (PACERONE) 200 MG tablet Take 1 tablet (200 mg total) by mouth daily. 07/19/20   Gunther Zawadzki, Shaune Pascal, MD  atorvastatin (LIPITOR) 40 MG tablet Take 1 tablet by mouth once daily Patient taking differently: Take 40 mg by mouth daily. 08/09/20   [name], FNP  blood glucose meter kit and supplies Dispense based on patient and insurance preference. Use up to four times daily as directed. (FOR ICD-10 E10.9, E11.9). 11/04/18   [name], FNP  Blood Glucose Monitoring Suppl [facility] Karsten Fells) w/Device KIT Check blood sugars twice daily E11.9 11/30/19   [name], FNP  carvedilol (COREG) 6.25 MG tablet  Take 1 tablet (6.25 mg total) by mouth 2 (two) times daily. 10/04/20 11/03/20  Jashanti Clinkscale, Shaune Pascal, MD  cloNIDine (CATAPRES) 0.1 MG tablet Take 1 tablet (0.1 mg total) by mouth 2 (two) times daily. 07/03/20   Chibueze Beasley, Shaune Pascal, MD  fluticasone (FLONASE) 50 MCG/ACT nasal spray Place 1 spray into both nostrils daily. 01/28/20   Collene Gobble, MD  furosemide (LASIX) 40 MG tablet Take 1 tablet (40 mg total) by mouth daily. Take 80 mg in AM and 40 mg in PM (4PM) 07/09/20   Debbe Odea, MD  glucose blood test strip check blood sugars twice daily Dx code E11.22 03/02/20   [name], FNP  hydrALAZINE (APRESOLINE) 100 MG tablet Take 1 tablet (100 mg total) by mouth 3 (three) times daily. 09/27/20   Lorretta Harp, MD  Insulin Glargine [facility]) 100 UNIT/ML Inject 20 Units into the skin daily. 09/05/20   [name], FNP  Insulin Pen Needle 29G X 5MM MISC Use as directed 06/16/17   Bonnielee Haff, MD  Lancets (ONETOUCH DELICA PLUS [id number]) [place] check blood sugars twice daily Dx code: E11.22 03/02/20   [name], FNP  latanoprost (XALATAN) 0.005 % ophthalmic solution Place 1 drop into both eyes at bedtime.     [provider]  levothyroxine (SYNTHROID) 150 MCG tablet Take 1 tablet (150 mcg total) by mouth daily before breakfast. 09/07/20   [name], FNP  linagliptin (TRADJENTA) 5 MG TABS tablet Take 1 tablet (5 mg total) by mouth daily. 02/03/20   [name], FNP  loratadine (CLARITIN) 10 MG tablet Take 10 mg by mouth daily.    [provider]  Multiple Vitamins-Minerals (CENTRUM SILVER 50+WOMEN) TABS Take 1 tablet by mouth daily.    [provider]  omeprazole (PRILOSEC) 40 MG capsule Take 1 capsule (40 mg total) by mouth 2 (two) times daily before a meal. 09/27/20 09/27/21  [name], FNP  oxyCODONE-acetaminophen (PERCOCET) 5-325 MG tablet Take 1 tablet by mouth every 4 (four) hours as needed for severe pain. 09/26/20 09/26/21  Waynetta Sandy,  MD  sildenafil (REVATIO) 20 MG tablet Take 1 tablet (20 mg total) by mouth 3 (three) times daily. 09/06/20   Johnice Riebe, Shaune Pascal, MD  sodium bicarbonate 650 MG tablet Take 650 mg by mouth 2 (two) times daily. 06/27/20   [provider]  sodium chloride (OCEAN) 0.65 % nasal spray Place 1 spray into the nose in the morning, at noon, in the evening, and at bedtime.    [provider]    Inpatient Medications: Scheduled Meds: . allopurinol  300 mg Oral Daily  . amiodarone  200 mg Oral Daily  . aspirin  324 mg Oral NOW   Or  . aspirin  300 mg Rectal NOW  .  atorvastatin  40 mg Oral Daily  . Basaglar KwikPen  20 Units Subcutaneous Daily  . carvedilol  6.25 mg Oral BID  . cloNIDine  0.1 mg Oral BID  . fluticasone  1 spray Each Nare Daily  . furosemide  40 mg Oral Daily  . heparin  5,000 Units Subcutaneous Q8H  . hydrALAZINE  100 mg Oral TID  . insulin aspart  0-9 Units Subcutaneous TID WC  . latanoprost  1 drop Both Eyes QHS  . [START ON 10/10/2020] levothyroxine  150 mcg Oral QAC breakfast  . linagliptin  5 mg Oral Daily  . loratadine  10 mg Oral Daily  .  morphine injection  4 mg Intravenous Once  . pantoprazole  40 mg Oral Daily  . sildenafil  20 mg Oral TID  . sodium bicarbonate  650 mg Oral BID   Continuous Infusions:  PRN Meds: acetaminophen, HYDROmorphone (DILAUDID) injection, nitroGLYCERIN, nitroGLYCERIN, ondansetron (ZOFRAN) IV, oxyCODONE-acetaminophen, sodium chloride  Allergies:    Allergies  Allergen Reactions  . Ancef [Cefazolin] Itching    Severe itching- after procedure, ancef was the antibiotic.-04/02/17 Tolerates penicillins  . Lactose Intolerance (Gi) Diarrhea  . Sulfa Antibiotics Rash and Other (See Comments)    Blisters, also    Social History:   Social History   Socioeconomic History  . Marital status: Married    Spouse name: Not on file  . Number of children: Not on file  . Years of education: Not on file  . Highest education level:  Not on file  Occupational History  . Occupation: retired  Tobacco Use  . Smoking status: Former Smoker    Packs/day: 0.25    Years: 20.00    Pack years: 5.00    Types: Cigarettes    Quit date: 01/31/2001    Years since quitting: 19.7  . Smokeless tobacco: Former Network engineer  . Vaping Use: Never used  Substance and Sexual Activity  . Alcohol use: Yes    Alcohol/week: 2.0 standard drinks    Types: 2 Shots of liquor per week    Comment: occ  . Drug use: Never  . Sexual activity: Not on file  Other Topics Concern  . Not on file  Social History Narrative  . Not on file   Social Determinants of Health   Financial Resource Strain: Medium Risk  . Difficulty of Paying Living Expenses: Somewhat hard  Food Insecurity: Not on file  Transportation Needs: Not on file  Physical Activity: Not on file  Stress: Not on file  Social Connections: Not on file  Intimate Partner Violence: Not on file    Family History:   Family History  Problem Relation Age of Onset  . Hypertension Mother   . Diabetes Mother   . Cervical cancer Mother   . Heart attack Father   . Hypertension Sister   . Hypertension Brother   . Hypertension Sister   . Hypertension Sister   . Prostate cancer Brother   . HIV/AIDS Brother      ROS:  Please see the history of present illness.  All other ROS reviewed and negative.     Physical Exam/Data:   Vitals:   10/09/20 1100 10/09/20 1115 10/09/20 1206 10/09/20 1215  BP:   (!) 132/51 (!) 139/45  Pulse: 84 77 72 79  Resp: _0 Temp:      TempSrc:      SpO2: (!) 88% 100% 98% 100%  Weight:  Height:       No intake or output data in the 24 hours ending 10/09/20 1449 Last 3 Weights 10/09/2020 09/26/2020 09/18/2020  Weight (lbs) 210 lb 5.1 oz 206 lb 205 lb  Weight (kg) 95.4 kg 93.441 kg 92.987 kg     Body mass index is 36.1 kg/m.  General: Well developed, well nourished obese AAF in no acute distress. Head: Normocephalic, atraumatic, sclera  non-icteric, no xanthomas, nares are without discharge. Neck: Negative for carotid bruits. JVP not elevated. Lungs: Clear BS bilaterally to auscultation without wheezes, rales, or rhonchi. Breathing is unlabored. Heart: RRR, pronounced systolic murmur heard over entire precordium but best in RUSB, subtle S2, no rubs or gallops.  Abdomen: Soft, non-tender, non-distended with normoactive bowel sounds. No rebound/guarding. Extremities: No clubbing or cyanosis. No edema. Distal pedal pulses are 2+ and equal bilaterally. Neuro: Alert and oriented X 3. Moves all extremities spontaneously. Psych:  Responds to questions appropriately with a normal affect.  EKG:  The EKG was personally reviewed and demonstrates:  NSR 72bpm, first degree AVB, ST depression I, II, III, avF, V5-V6, QTc 482m  Telemetry:  Telemetry was personally reviewed and demonstrates: NSR  Relevant CV Studies:  Carotid [place]20[place] Summary:  Right Carotid: Previous carotid artery duplex noted occlusion of right  ICA,         however a dampened waveform was detected by pulsed wave  Doppler         in the proximal and distal ICA, suggestive of likely near         occlusion with possible distal occlusion.   Left Carotid: Peak systolic ICA velocities suggestive of 60-79% stenosis,        however end diastolic velocities suggest upper range 1-39%        stenosis. Increased perfusion may be secondary to  compensatory        flow, considering right ICA disease.   Vertebrals: Bilateral vertebral arteries demonstrate antegrade flow.  Subclavians: Right subclavian artery flow was disturbed. Normal flow        hemodynamics were seen in the left subclavian artery.   *See table(s) above for measurements and observations.    2D echo 05/2020 1. Left ventricular ejection fraction, by estimation, is 60 to 65%. The  left ventricle has normal function. The left ventricle has no  regional  wall motion abnormalities. There is moderate concentric left ventricular  hypertrophy. Left ventricular  diastolic parameters are consistent with Grade II diastolic dysfunction  (pseudonormalization). Elevated left ventricular end-diastolic pressure.  2. Right ventricular systolic function is normal. The right ventricular  size is normal. There is moderately elevated pulmonary artery systolic  pressure. The estimated right ventricular systolic pressure is 594.7mmHg.  3. Left atrial size was moderately dilated.  4. Right atrial size was severely dilated.  5. The mitral valve is rheumatic. Mild mitral valve regurgitation.  Moderate mitral stenosis. The mean mitral valve gradient is 10.0 mmHg.  Severe mitral annular calcification.  6. Tricuspid valve regurgitation is mild to moderate.  7. The aortic valve is calcified. Aortic valve regurgitation is trivial.  Severe aortic valve stenosis. Aortic valve area, by VTI measures 0.84 cm.  Aortic valve Vmax measures 4.07 m/s.  8. The inferior vena cava is dilated in size with <50% respiratory  variability, suggesting right atrial pressure of 15 mmHg.   Comparison(s): No significant change from prior study.   Renal duplex 01/2020 Right: Normal size right kidney. Abnormal right Resistive Index.  Normal cortical thickness of right kidney. Evidence of a     greater than 60% stenosis of the right renal artery (low end     of range based on peak velocity). RRV flow present.  Left: Normal size of left kidney. Abnormal left Resisitve Index.     Normal cortical thickness of the left kidney. 1-59% stenosis     of the left renal artery. LRV flow present.  Mesenteric:  70 to 99% stenosis in the celiac artery and superior mesenteric artery.    *See table(s) above for measurements and observations.    Consider Vascular consult.  Diagnosing physician: Kathlyn Sacramento MD    Electronically signed by Kathlyn Sacramento  MD on 02/10/2020 at 1:03:12 PM.   Eva 04/2019 Findings:  RA = 8 RV = 53/3 PA = 57/12 (32) PCW = 14 (v waves to 33) Fick cardiac output/index = 8.4/4.5 PVR = 2.1 WU Ao sat = 99% PA sat = 73%, 74% High SVC sat = 74%  Assessment: 1. Mild PAH in setting of high-output state 2. Normal PCWP with significant v-waves  Plan/Discussion:  Marked improvement in pulmonary pressures. She has evidence of high output state with no evidence intracardiac shunting. There are prominent v-waves in PCWP tracing suggestive of MR or significant diastolic dysfunction. Consider echo with bubble study.   Glori Bickers, MD  1:02 PM  [facility] 05/2018 Conclusions: 1. No angiographically significant coronary artery disease. 2. Severe pulmonary hypertension (mean PAP 62 mmHg; PVR 5.4 WU). 3. Mildly elevated left heart filling pressure. 4. Moderately elevated right heart filling pressure.  Right atrial waveform has an "M" or "W" appearance that suggests restrictive/contrictive physiology. 5. Moderate aortic stenosis. 6. Probably moderate mitral stenosis.  Transmitral gradient (12 mmHg) may be accentuated by mitral regurgitation and anemia.  Prominent v-waves also suggest a degree of mitral regurgitation. 7. Normal to supranormal Fick cardiac output/index.  Recommendations: 1. Anticipate restarting diuresis tomorrow, as renal function allows following contrast administration today. 2. Consider advanced heart failure consultation to assist with management of HFpEF and severe pulmonary hypertension. I do not believe that valvular disease is the primary cause of the patient's symptoms. 3. Primary prevention of coronary artery disease.  No indication for antiplatelet therapy at this time.       Laboratory Data:  High Sensitivity Troponin:   Recent Labs  Lab 10/09/20 0554 10/09/20 0845  TROPONINIHS 44* 115*     Chemistry Recent Labs  Lab 10/09/20 0554  NA 132*  K 3.1*  CL 98  CO2 20*   GLUCOSE 208*  BUN 123*  CREATININE 4.05*  CALCIUM 9.0  GFRNONAA 12*  ANIONGAP 14    Recent Labs  Lab 10/09/20 0554  PROT 6.0*  ALBUMIN 3.1*  AST 113*  ALT 75*  ALKPHOS 110  BILITOT 1.0   HematologyNo results for input(s): WBC, RBC, HGB, HCT, MCV, MCH, MCHC, RDW, PLT in the last 168 hours. BNPNo results for input(s): BNP, PROBNP in the last 168 hours.  DDimer No results for input(s): DDIMER in the last 168 hours.   Radiology/Studies:  DG Chest Port 1 View  Result Date: 10/09/2020 CLINICAL DATA:  Chest pain EXAM: PORTABLE CHEST 1 VIEW COMPARISON:  07/06/2020 FINDINGS: Cardiomegaly and hilar vascular enlargement which is stable. No Kerley lines, effusion, or pneumothorax. No air bronchogram. Artifact from EKG leads. IMPRESSION: Cardiomegaly and vascular congestion. Electronically Signed   By: Monte Fantasia M.D.   On: 10/09/2020 04:12     Assessment and Plan:   1.  Chest pain with elevated troponin and ST depression on EKG - symptoms initially presented only with recumbency then progressed to persistent - raising question of angina decubitus vs demand process vs uremic pericarditis - hs Troponin 44-> 115 -> repeat pending - EKG shows NSR 72bpm, first degree AVB, ST depression I, II, III, avF, V5-V6, QTc 497m - would avoid further NTG given severe AS - not previously on blood thinners due to transfusion-dependent anemia - per d/w Dr. BHaroldine Laws await CBC and f/u troponin   2. Severe aortic stenosis and moderate mitral stenosis - echo being done, await result - with development of angina would be concerned for progression of disease - await CBC as well  3. PAH - suspect multifactorial in nature suspect including high output due to anemia, WHO group II (diastolic HF and valvular disease) and III (OSA) but given degree of PH cannot exclude WHO Group I component. - RHC 2019 with severe PAH; repeat [place] with improved PA pressures - prior VQ negative, PFTs 8/20 with  normal spirometry and moderately reduced DLCO. - sleep study still pending - on sildenafil as OP, pending input from MD - 2D echo presently being done  4. CKD stage V with hypokalemia - nephrology consulted by IM due to uremia - lyte repletion per primary team  5. Paroxysmal atrial fibrillation - maintaining NSR on home amiodarone - not on OGreeleydue to transfusion dependent anemia - last TSH wnl 08/2020, some elevation in LFTs this month/this admission noted - will need to trend and follow  6. Chronic diastolic CHF - last echo EF 60-65% G2DD severe AS mean 337mG AVA 0.84cm2. Moderate MS RVSP 54 mmHg - home diuretic regimen 8064mAM, 33m53mM -> continue for now and await input from Dr. BensHaroldine Laws Severe transfusion-dependent anemia in setting of myelodysplastic syndrome, also recent + FOBT admission 08/2020 and epistaxis - CBC ordered and pending - transfuse as needed  Risk Assessment/Risk Scores:     HEAR Score (for undifferentiated chest pain):  HEAR Score: 7  NYHA class II-III at baseline  For questions or updates, please contact [facility]stat[facility]ase consult [email or web address] for contact info under    Signed, DaynCharlie Pitter-C  10/09/2020 2:49 PM   Patient seen and examined with the above-signed Advanced Practice Provider and/or Housestaff. I personally reviewed laboratory data, imaging studies and relevant notes. I independently examined the patient and formulated the important aspects of the plan. I have edited the note to reflect any of my changes or salient points. I have personally discussed the plan with the patient and/or family.  64 y41 woman as above with severe AS, diastolic HF, CKD IV, PAH and MDS with transfusion dependent anemia  Cath 11/19 normal coronaries  We have been following her in conjunction with Structural team for possible TAVR however w/u complicated by advanced renal disease. Recently had AVF placed about 2-3 weeks ago.   Presents today with 3  days of stuttering CP. Worse when lying down. Pain escalated today was "11/10" came to ER. ECG with global ST depression suggestive of ischemia hs trop  44-> 115 -> 3,951  Hgb 4.6 BUN/Cr 123/4.1  General:  Uncomfortable . No resp difficulty HEENT: normal Neck: supple. JVP to jaw . Carotids 2+ bilat; no bruits. No lymphadenopathy or thryomegaly appreciated. Cor: PMI nondisplaced. Regular rate & rhythm. 2/6 AS s2 reduced but not obliterated Lungs: clear Abdomen: obese soft, nontender, nondistended. No hepatosplenomegaly. No bruits or masses. Good bowel sounds. Extremities: no  cyanosis, clubbing, rash, edema Neuro: alert & orientedx3, cranial nerves grossly intact. moves all 4 extremities w/o difficulty. Affect pleasant  Very complicated situation. Presentation concerning for ACS but has normal coronaries in 11/19. With severe AS and hgb 4.6 suspect mostly demand ischemia. With no injury current on ECG will plan conservative management/ Agree with 3uRBCs.   Will check echo. Will need team approach to decide next steps. Suspect she will need initiation of HD soon but likely will not tolerate with severe AS. Placing fresh AVR while patient has HD cath in would also not be ideal from infectious risk. And all this is complicated by MDS and recurrent anemia.   We will take it one step at a time. Start with transfusion. Follow renal function.   CRITICAL CARE Performed by: Glori Bickers  Total critical care time: 35 minutes  Critical care time was exclusive of separately billable procedures and treating other patients.  Critical care was necessary to treat or prevent imminent or life-threatening deterioration.  Critical care was time spent personally by me (independent of midlevel providers or residents) on the following activities: development of treatment plan with patient and/or surrogate as well as nursing, discussions with consultants, evaluation of patient's response to treatment,  examination of patient, obtaining history from patient or surrogate, ordering and performing treatments and interventions, ordering and review of laboratory studies, ordering and review of radiographic studies, pulse oximetry and re-evaluation of patient's condition.  Glori Bickers, MD  5:13 PM

## 2020-10-09 NOTE — ED Triage Notes (Signed)
Chest pain from home brought in by [facility] chest pain for 3 days.  She has a new fistula in her rt arm no dialysis yet  The pain is better when she leans forward

## 2020-10-09 NOTE — ED Provider Notes (Signed)
[facility] EMERGENCY DEPARTMENT Provider Note   CSN: [id number] Arrival date & time: 10/09/20  0331   History Chief complaint: Chest pain  Patricia Mata is a 65 y.o. female.  The history is provided by the patient.  She has history of hypertension, diabetes, hyperlipidemia, chronic kidney disease, myelodysplastic syndrome and comes in because of chest pain.  Pain has been present for last 2 days and has been in the central chest with some radiation of the left arm.  Pain is described as dull and is severe.  She rates it at 10/10.  Initially, if she sat up, pain would get better.  Tonight, pain has not improved with sitting position.  She denies any exertional component of pain.  She denies dyspnea or diaphoresis.  There has been mild nausea with no vomiting.  She has never had similar pain before.  She came by ambulance and was given aspirin in the ambulance but no nitroglycerin.  She is a non-smoker and there is no family history of premature coronary atherosclerosis.  Past Medical History:  Diagnosis Date  . Acute renal failure (ARF) ([place]) 06/08/2018  . Cancer [facility]) 2008   Colon   . Chronic diastolic (congestive) heart failure ([place])   . Diabetes mellitus without complication ([place])   . Gout 09/08/2018  . Hypertension   . Hypothyroidism   . Macrocytic anemia   . MDS (myelodysplastic syndrome) ([place])   . Stage 4 chronic kidney disease [facility])     Patient Active Problem List   Diagnosis Date Noted  . Gastroesophageal reflux disease 09/12/2020  . History of malignant neoplasm of colon 09/12/2020  . Chronic gastritis 09/02/2020  . Symptomatic anemia 08/31/2020  . Abnormal uterine bleeding (AUB) 07/19/2020  . Vaginal ulceration 07/19/2020  . Acute renal failure superimposed on stage 4 chronic kidney disease ([place])   . Palliative care by specialist   . Acute on chronic diastolic CHF (congestive heart failure) ([place]) 06/07/2020  . Chronic cough 05/04/2020  .  Allergic rhinitis 03/23/2020  . Chronic kidney disease, stage 5 ([place]) 02/03/2020  . Acute diastolic CHF (congestive heart failure) ([place]) 09/21/2019  . HCAP (healthcare-associated pneumonia) 09/20/2019  . Pulmonary hypertension ([place]) 09/20/2019  . CKD (chronic kidney disease), stage III ([place]) 09/20/2019  . DM2 (diabetes mellitus, type 2) ([place]) 09/20/2019  . Elevated troponin 09/20/2019  . Severe sepsis ([place]) 09/20/2019  . CAP (community acquired pneumonia) 09/14/2019  . Hypoxia 09/14/2019  . Epistaxis, recurrent 08/10/2019  . Neutropenia ([place]) 05/25/2019  . Malignant neoplasm of colon ([place]) 05/25/2019  . Bradycardia 03/21/2019  . Pneumonia due to COVID-19 virus 03/18/2019  . Type 2 diabetes mellitus with hemoglobin A1c goal of less than 7.0% ([place]) 12/10/2018  . Hypothyroidism 12/10/2018  . Seasonal allergies 12/10/2018  . Gout 09/08/2018  . 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 Sacral wound 06/08/2018  . Iron deficiency anemia due to chronic blood loss 06/08/2018  . Hyponatremia 06/08/2018  . Constipation 06/08/2018  . Encounter for nasogastric (NG) tube placement   . Pressure injury of skin 05/22/2018  . PAF (paroxysmal atrial fibrillation) ([place]) 05/11/2018  . Normocytic anemia 06/14/2017  . Aortic stenosis, severe 06/10/2017  . Carotid artery disease ([place]) 06/10/2017  . Essential hypertension 05/13/2017  . Hyperlipidemia 05/13/2017  . Bilateral lower extremity edema 05/13/2017  . Port-A-Cath in place 04/28/2017  . MDS (myelodysplastic syndrome) ([place]) 03/20/2017  . Goals of care, counseling/discussion 03/20/2017  . Anemia in chronic kidney disease 05/10/2016  .  Anemia in stage 1 chronic kidney disease 05/10/2016  . Anemia due to chronic kidney disease 02/09/2016    Past Surgical History:  Procedure Laterality Date  . AV FISTULA PLACEMENT Right 09/26/2020   Procedure: RIGHT ARM ARTERIOVENOUS (AV) FISTULA CREATION;  Surgeon: Waynetta Sandy, MD;   Location: [facility] OR;  Service: Vascular;  Laterality: Right;  Monitor Anesthesia Care with Regional Block to Right Arm   . COLON SURGERY    . DEBRIDMENT OF DECUBITUS ULCER N/A 06/12/2018   Procedure: DEBRIDMENT OF DECUBITUS ULCER;  Surgeon: Wallace Going, DO;  Location: [facility] ORS;  Service: Plastics;  Laterality: N/A;  . ESOPHAGOGASTRODUODENOSCOPY N/A 09/01/2020   Procedure: ESOPHAGOGASTRODUODENOSCOPY (EGD);  Surgeon: Clarene Essex, MD;  Location: [facility];  Service: Endoscopy;  Laterality: N/A;  . GIVENS CAPSULE STUDY N/A 09/01/2020   Procedure: GIVENS CAPSULE STUDY;  Surgeon: Clarene Essex, MD;  Location: [facility] ENDOSCOPY;  Service: Endoscopy;  Laterality: N/A;  . IR FLUORO GUIDE PORT INSERTION RIGHT  04/02/2017  . IR REMOVAL TUN ACCESS W/ PORT W/O FL MOD SED  03/16/2019  . IR US GUIDE VASC ACCESS RIGHT  04/02/2017  . RIGHT HEART CATH N/A 04/23/2019   Procedure: RIGHT HEART CATH;  Surgeon: Jolaine Artist, MD;  Location: [facility];  Service: Cardiovascular;  Laterality: N/A;  . RIGHT/LEFT HEART CATH AND CORONARY ANGIOGRAPHY N/A 05/21/2018   Procedure: RIGHT/LEFT HEART CATH AND CORONARY ANGIOGRAPHY;  Surgeon: Nelva Bush, MD;  Location: [facility];  Service: Cardiovascular;  Laterality: N/A;  . TUBAL LIGATION       OB History   No obstetric history on file.     Family History  Problem Relation Age of Onset  . Hypertension Mother   . Diabetes Mother   . Cervical cancer Mother   . Heart attack Father   . Hypertension Sister   . Hypertension Brother   . Hypertension Sister   . Hypertension Sister   . Prostate cancer Brother   . HIV/AIDS Brother     Social History   Tobacco Use  . Smoking status: Former Smoker    Packs/day: 0.25    Years: 20.00    Pack years: 5.00    Types: Cigarettes    Quit date: 01/31/2001    Years since quitting: 19.7  . Smokeless tobacco: Former Network engineer  . Vaping Use: Never used  Substance Use Topics  . Alcohol use: Yes     Alcohol/week: 2.0 standard drinks    Types: 2 Shots of liquor per week    Comment: occ  . Drug use: Never    Home Medications Prior to Admission medications   Medication Sig Start Date End Date Taking? Authorizing Provider  allopurinol (ZYLOPRIM) 300 MG tablet Take 300 mg by mouth daily. 07/05/20   [provider]  amiodarone (PACERONE) 200 MG tablet Take 1 tablet (200 mg total) by mouth daily. 07/19/20   Bensimhon, Shaune Pascal, MD  atorvastatin (LIPITOR) 40 MG tablet Take 1 tablet by mouth once daily Patient taking differently: Take 40 mg by mouth daily. 08/09/20   [name], FNP  blood glucose meter kit and supplies Dispense based on patient and insurance preference. Use up to four times daily as directed. (FOR ICD-10 E10.9, E11.9). 11/04/18   [name], FNP  Blood Glucose Monitoring Suppl [facility]) w/Device KIT Check blood sugars twice daily E11.9 11/30/19   [name], FNP  carvedilol (COREG) 6.25 MG tablet Take 1 tablet (6.25 mg total)  by mouth 2 (two) times daily. 10/04/20 11/03/20  Bensimhon, Shaune Pascal, MD  cloNIDine (CATAPRES) 0.1 MG tablet Take 1 tablet (0.1 mg total) by mouth 2 (two) times daily. 07/03/20   Bensimhon, Shaune Pascal, MD  fluticasone (FLONASE) 50 MCG/ACT nasal spray Place 1 spray into both nostrils daily. 01/28/20   Collene Gobble, MD  furosemide (LASIX) 40 MG tablet Take 1 tablet (40 mg total) by mouth daily. Take 80 mg in AM and 40 mg in PM (4PM) 07/09/20   Debbe Odea, MD  glucose blood test strip check blood sugars twice daily Dx code E11.22 03/02/20   [name], FNP  hydrALAZINE (APRESOLINE) 100 MG tablet Take 1 tablet (100 mg total) by mouth 3 (three) times daily. 09/27/20   Lorretta Harp, MD  Insulin Glargine V [facility]) 100 UNIT/ML Inject 20 Units into the skin daily. 09/05/20   [name], FNP  Insulin Pen Needle 29G X 5MM MISC Use as directed 06/16/17   Bonnielee Haff, MD  Lancets (ONETOUCH DELICA PLUS [id number]) [place] check  blood sugars twice daily Dx code: E11.22 03/02/20   [name], FNP  latanoprost (XALATAN) 0.005 % ophthalmic solution Place 1 drop into both eyes at bedtime.     [provider]  levothyroxine (SYNTHROID) 150 MCG tablet Take 1 tablet (150 mcg total) by mouth daily before breakfast. 09/07/20   [name], FNP  linagliptin (TRADJENTA) 5 MG TABS tablet Take 1 tablet (5 mg total) by mouth daily. 02/03/20   [name], FNP  loratadine (CLARITIN) 10 MG tablet Take 10 mg by mouth daily.    [provider]  Multiple Vitamins-Minerals (CENTRUM SILVER 50+WOMEN) TABS Take 1 tablet by mouth daily.    [provider]  omeprazole (PRILOSEC) 40 MG capsule Take 1 capsule (40 mg total) by mouth 2 (two) times daily before a meal. 09/27/20 09/27/21  [name], FNP  oxyCODONE-acetaminophen (PERCOCET) 5-325 MG tablet Take 1 tablet by mouth every 4 (four) hours as needed for severe pain. 09/26/20 09/26/21  Waynetta Sandy, MD  sildenafil (REVATIO) 20 MG tablet Take 1 tablet (20 mg total) by mouth 3 (three) times daily. 09/06/20   Bensimhon, Shaune Pascal, MD  sodium bicarbonate 650 MG tablet Take 650 mg by mouth 2 (two) times daily. 06/27/20   [provider]  sodium chloride (OCEAN) 0.65 % nasal spray Place 1 spray into the nose in the morning, at noon, in the evening, and at bedtime.    [provider]    Allergies    Ancef [cefazolin], Lactose intolerance (gi), and Sulfa antibiotics  Review of Systems   Review of Systems  All other systems reviewed and are negative.   Physical Exam Updated Vital Signs BP (!) 130/45   Pulse 72   Temp 97.6 F (36.4 C) (Oral)   Resp 17   SpO2 100%   Physical Exam Vitals and nursing note reviewed.   65 year old female, appears restless and uncomfortable, but is in no acute distress. Vital signs are normal. Oxygen saturation is 100%, which is normal. Head is normocephalic and atraumatic. PERRLA, EOMI. Oropharynx is  clear. Neck is nontender and supple without adenopathy or JVD. Back is nontender and there is no CVA tenderness. Lungs are clear without rales, wheezes, or rhonchi. Chest is mildly tender diffusely.  There is no crepitus. Heart has regular rate and rhythm with 3/6 systolic ejection murmur heard throughout the precordium but loudest in the aortic area with radiation to the neck.  Abdomen is soft, flat, nontender without masses or hepatosplenomegaly and peristalsis is normoactive. Extremities have 2+ edema, full range of motion is present. Skin is warm and dry without rash. Neurologic: Mental status is normal, cranial nerves are intact, there are no motor or sensory deficits.  ED Results / Procedures / Treatments   Labs (all labs ordered are listed, but only abnormal results are displayed) Labs Reviewed - No data to display  EKG ARNETHA, SILVERTHORNE PV:[id number] 09-Oct-2020 03:31:58 [facility]-NLD ROUTINE RECORD Sinus rhythm Prolonged PR interval Repol abnrm suggests ischemia, inferior leads When compared with ECG of 08/31/2020, ST abnormality is more pronounced QT has shortened Confirmed by Delora Fuel ([id number]) on 10/09/2020 3:41:39 AM 77m/s 139mmV '150Hz'  9.0.4 CID: [id number] By: DaDelora Fuelent. rate 72 BPM PR interval * ms QRS duration 104 ms QT/QTc 411/450 ms P-R-T axes 124 24 -32 [id number]) Female Black RoOFHQ:[id number] Technician: [id number] ind  Radiology DG Chest Port 1 View  Result Date: 10/09/2020 CLINICAL DATA:  Chest pain EXAM: PORTABLE CHEST 1 VIEW COMPARISON:  07/06/2020 FINDINGS: Cardiomegaly and hilar vascular enlargement which is stable. No Kerley lines, effusion, or pneumothorax. No air bronchogram. Artifact from EKG leads. IMPRESSION: Cardiomegaly and vascular congestion. Electronically Signed   By: JoMonte Fantasia.D.   On: 10/09/2020 04:12    Procedures Procedures   Medications Ordered in ED Medications - No data to  display  ED Course  I have reviewed the triage vital signs and the nursing notes.  Pertinent labs & imaging results that were available during my care of the patient were reviewed by me and considered in my medical decision making (see chart for details).  MDM Rules/Calculators/A&P Chest pain with pattern somewhat atypical.  However, ECG does show ST changes which are concerning for ischemia.  Old records are reviewed, and echocardiogram 06/07/2020 did show grade 2 diastolic dysfunction with normal ejection fraction, also severe aortic stenosis along with mild mitral regurgitation and moderate mitral stenosis.  The murmur I am hearing is more consistent with aortic stenosis.  We will give trial of nitroglycerin, patient is started on cardiac evaluation protocol.  5:10 AM No response to nitroglycerin.  She is given morphine for pain control.  7:41 AM She had slight relief with morphine, is given a dose of hydromorphone.  Labs are pending.  Case is signed out to Dr. ShKarle Starch Final Clinical Impression(s) / ED Diagnoses Final diagnoses:  Nonspecific chest pain    Rx / DC Orders ED Discharge Orders    None       GlDelora FuelMD [id number]

## 2020-10-09 NOTE — H&P (Signed)
History and Physical    Patricia Mata VPX:[id number] DOB: 01-Nov-1955 DOA: 10/09/2020  PCP: [name], FNP (Confirm with patient/family/NH records and if not entered, this has to be entered at [facility] point of entry) Patient coming from: Home  I have personally briefly reviewed patient's old medical records in [place]  Chief Complaint: Chest pain  HPI: Patricia Mata is a 65 y.o. female with medical history significant of CKD stage V, refractory HTN, severe aortic stenosis, moderate to severe mitral stenosis, moderate pulmonary HTN, IDDM, MDS, gout, presented with chest pain.  Symptoms started 3 days ago, happened in the middle of night, " aching like" localized retrosternal, 7-8/10, relieved by sitting up, no exacerbating factors but seem to be more severe when lying down.  Could not sleep for the last 3 nights due to chest pain, denied any associated symptoms of nausea vomiting sweating palpitations.  Denies any fever chills.  1 week ago, patient had right arm A-V fistula set up for future HD by her nephrologist.  ED Course: Continues to complain about chest pain, troponin 44> 155, EKG showed chronic ST-T changes as before.  Blood work showed BUN 123, creatinine 4.0  Review of Systems: As per HPI otherwise 14 point review of systems negative.    Past Medical History:  Diagnosis Date  . Acute renal failure (ARF) ([place]) 06/08/2018  . Carotid artery disease ([place])   . Chronic diastolic (congestive) heart failure ([place])   . Colon cancer ([place])   . Diabetes mellitus ([place])   . Gout 09/08/2018  . Hypertension   . Hypothyroidism   . Macrocytic anemia   . MDS (myelodysplastic syndrome) ([place])   . Moderate mitral stenosis   . PAD (peripheral artery disease) ([place])   . PAF (paroxysmal atrial fibrillation) ([place])   . Pulmonary hypertension ([place])   . Renal artery stenosis (HCC)    a. >60% R renal artery stenosis by duplex 2021, managed medically.  . Severe aortic stenosis   .  Stage 4 chronic kidney disease ([place])   . Transfusion-dependent anemia     Past Surgical History:  Procedure Laterality Date  . AV FISTULA PLACEMENT Right 09/26/2020   Procedure: RIGHT ARM ARTERIOVENOUS (AV) FISTULA CREATION;  Surgeon: Waynetta Sandy, MD;  Location: [facility] OR;  Service: Vascular;  Laterality: Right;  Monitor Anesthesia Care with Regional Block to Right Arm   . COLON SURGERY    . DEBRIDMENT OF DECUBITUS ULCER N/A 06/12/2018   Procedure: DEBRIDMENT OF DECUBITUS ULCER;  Surgeon: Wallace Going, DO;  [facility] ORS;  Service: Plastics;  Laterality: N/A;  . ESOPHAGOGASTRODUODENOSCOPY N/A 09/01/2020   Procedure: ESOPHAGOGASTRODUODENOSCOPY (EGD);  Surgeon: Clarene Essex, MD;  Location: [facility];  Service: Endoscopy;  Laterality: N/A;  . GIVENS CAPSULE STUDY N/A 09/01/2020   Procedure: GIVENS CAPSULE STUDY;  Surgeon: Clarene Essex, MD;  [facility];  Service: Endoscopy;  Laterality: N/A;  . IR FLUORO GUIDE PORT INSERTION RIGHT  04/02/2017  . IR REMOVAL TUN ACCESS W/ PORT W/O FL MOD SED  03/16/2019  . IR US GUIDE VASC ACCESS RIGHT  04/02/2017  . RIGHT HEART CATH N/A 04/23/2019   Procedure: RIGHT HEART CATH;  Surgeon: Jolaine Artist, MD;  Location: [facility] CV LAB;  Service: Cardiovascular;  Laterality: N/A;  . RIGHT/LEFT HEART CATH AND CORONARY ANGIOGRAPHY N/A 05/21/2018   Procedure: RIGHT/LEFT HEART CATH AND CORONARY ANGIOGRAPHY;  Surgeon: Nelva Bush, MD;  Location: [facility] LAB;  Service: Cardiovascular;  Laterality: N/A;  .  TUBAL LIGATION       reports that she quit smoking about 19 years ago. Her smoking use included cigarettes. She has a 5.00 pack-year smoking history. She has quit using smokeless tobacco. She reports current alcohol use of about 2.0 standard drinks of alcohol per week. She reports that she does not use drugs.  Allergies  Allergen Reactions  . Ancef [Cefazolin] Itching    Severe itching- after procedure, ancef was the  antibiotic.-04/02/17 Tolerates penicillins  . Lactose Intolerance (Gi) Diarrhea  . Sulfa Antibiotics Rash and Other (See Comments)    Blisters, also    Family History  Problem Relation Age of Onset  . Hypertension Mother   . Diabetes Mother   . Cervical cancer Mother   . Heart attack Father   . Hypertension Sister   . Hypertension Brother   . Hypertension Sister   . Hypertension Sister   . Prostate cancer Brother   . HIV/AIDS Brother      Prior to Admission medications   Medication Sig Start Date End Date Taking? Authorizing Provider  allopurinol (ZYLOPRIM) 300 MG tablet Take 300 mg by mouth daily. 07/05/20   [provider]  amiodarone (PACERONE) 200 MG tablet Take 1 tablet (200 mg total) by mouth daily. 07/19/20   Bensimhon, Shaune Pascal, MD  atorvastatin (LIPITOR) 40 MG tablet Take 1 tablet by mouth once daily Patient taking differently: Take 40 mg by mouth daily. 08/09/20   [name], FNP  blood glucose meter kit and supplies Dispense based on patient and insurance preference. Use up to four times daily as directed. (FOR ICD-10 E10.9, E11.9). 11/04/18   [name], FNP  Blood Glucose Monitoring Suppl [facility]) w/Device KIT Check blood sugars twice daily E11.9 11/30/19   [name], FNP  carvedilol (COREG) 6.25 MG tablet Take 1 tablet (6.25 mg total) by mouth 2 (two) times daily. 10/04/20 11/03/20  Bensimhon, Shaune Pascal, MD  cloNIDine (CATAPRES) 0.1 MG tablet Take 1 tablet (0.1 mg total) by mouth 2 (two) times daily. 07/03/20   Bensimhon, Shaune Pascal, MD  fluticasone (FLONASE) 50 MCG/ACT nasal spray Place 1 spray into both nostrils daily. 01/28/20   Collene Gobble, MD  furosemide (LASIX) 40 MG tablet Take 1 tablet (40 mg total) by mouth daily. Take 80 mg in AM and 40 mg in PM (4PM) 07/09/20   Debbe Odea, MD  glucose blood test strip check blood sugars twice daily Dx code E11.22 03/02/20   [name], FNP  hydrALAZINE (APRESOLINE) 100 MG tablet Take 1 tablet  (100 mg total) by mouth 3 (three) times daily. 09/27/20   Lorretta Harp, MD  Insulin Glargine [facility]) 100 UNIT/ML Inject 20 Units into the skin daily. 09/05/20   [name], FNP  Insulin Pen Needle 29G X 5MM MISC Use as directed 06/16/17   Bonnielee Haff, MD  Lancets (ONETOUCH DELICA PLUS [id number]) [place] check blood sugars twice daily Dx code: E11.22 03/02/20   [name], FNP  latanoprost (XALATAN) 0.005 % ophthalmic solution Place 1 drop into both eyes at bedtime.     [provider]  levothyroxine (SYNTHROID) 150 MCG tablet Take 1 tablet (150 mcg total) by mouth daily before breakfast. 09/07/20   [name], FNP  linagliptin (TRADJENTA) 5 MG TABS tablet Take 1 tablet (5 mg total) by mouth daily. 02/03/20   [name], FNP  loratadine (CLARITIN) 10 MG tablet Take 10 mg by mouth daily.    [provider]  Multiple Vitamins-Minerals (CENTRUM  SILVER 50+WOMEN) TABS Take 1 tablet by mouth daily.    [provider]  omeprazole (PRILOSEC) 40 MG capsule Take 1 capsule (40 mg total) by mouth 2 (two) times daily before a meal. 09/27/20 09/27/21  [name], FNP  oxyCODONE-acetaminophen (PERCOCET) 5-325 MG tablet Take 1 tablet by mouth every 4 (four) hours as needed for severe pain. 09/26/20 09/26/21  Waynetta Sandy, MD  sildenafil (REVATIO) 20 MG tablet Take 1 tablet (20 mg total) by mouth 3 (three) times daily. 09/06/20   Bensimhon, Shaune Pascal, MD  sodium bicarbonate 650 MG tablet Take 650 mg by mouth 2 (two) times daily. 06/27/20   [provider]  sodium chloride (OCEAN) 0.65 % nasal spray Place 1 spray into the nose in the morning, at noon, in the evening, and at bedtime.    [provider]    Physical Exam: Vitals:   10/09/20 0815 10/09/20 1100 10/09/20 1115 10/09/20 1206  BP: (!) 144/54   (!) 132/51  Pulse: 83 84 77 72  Resp: '12 13 20 20  ' Temp:      TempSrc:      SpO2: 100% (!) 88% 100% 98%  Weight:      Height:         Constitutional: NAD, calm, comfortable Vitals:   10/09/20 0815 10/09/20 1100 10/09/20 1115 10/09/20 1206  BP: (!) 144/54   (!) 132/51  Pulse: 83 84 77 72  Resp: '12 13 20 20  ' Temp:      TempSrc:      SpO2: 100% (!) 88% 100% 98%  Weight:      Height:       Eyes: PERRL, lids and conjunctivae normal ENMT: Mucous membranes are moist. Posterior pharynx clear of any exudate or lesions.Normal dentition.  Neck: normal, supple, no masses, no thyromegaly Respiratory: clear to auscultation bilaterally, no wheezing, no crackles. Normal respiratory effort. No accessory muscle use.  Cardiovascular: Regular rate and rhythm, loud systolic murmur on heart base, diminished S2. No extremity edema. 2+ pedal pulses. No carotid bruits.  Abdomen: no tenderness, no masses palpated. No hepatosplenomegaly. Bowel sounds positive.  Musculoskeletal: no clubbing / cyanosis. No joint deformity upper and lower extremities. Good ROM, no contractures. Normal muscle tone.  Skin: no rashes, lesions, ulcers. No induration Neurologic: CN 2-12 grossly intact. Sensation intact, DTR normal. Strength 5/5 in all 4.  Psychiatric: Normal judgment and insight. Alert and oriented x 3. Normal mood.     Labs on Admission: I have personally reviewed following labs and imaging studies  CBC: No results for input(s): WBC, NEUTROABS, HGB, HCT, MCV, PLT in the last 168 hours. Basic Metabolic Panel: Recent Labs  Lab 10/09/20 0554  NA 132*  K 3.1*  CL 98  CO2 20*  GLUCOSE 208*  BUN 123*  CREATININE 4.05*  CALCIUM 9.0   GFR: Estimated Creatinine Clearance: 15.5 mL/min (A) (by C-G formula based on SCr of 4.05 mg/dL (H)). Liver Function Tests: Recent Labs  Lab 10/09/20 0554  AST 113*  ALT 75*  ALKPHOS 110  BILITOT 1.0  PROT 6.0*  ALBUMIN 3.1*   Recent Labs  Lab 10/09/20 0554  LIPASE 32   No results for input(s): AMMONIA in the last 168 hours. Coagulation Profile: No results for input(s): INR, PROTIME in the  last 168 hours. Cardiac Enzymes: No results for input(s): CKTOTAL, CKMB, CKMBINDEX, TROPONINI in the last 168 hours. BNP (last 3 results) No results for input(s): PROBNP in the last 8760 hours. HbA1C: No results  for input(s): HGBA1C in the last 72 hours. CBG: No results for input(s): GLUCAP in the last 168 hours. Lipid Profile: No results for input(s): CHOL, HDL, LDLCALC, TRIG, CHOLHDL, LDLDIRECT in the last 72 hours. Thyroid Function Tests: No results for input(s): TSH, T4TOTAL, FREET4, T3FREE, THYROIDAB in the last 72 hours. Anemia Panel: No results for input(s): VITAMINB12, FOLATE, FERRITIN, TIBC, IRON, RETICCTPCT in the last 72 hours. Urine analysis:    Component Value Date/Time   COLORURINE YELLOW 07/06/2020 1706   APPEARANCEUR HAZY (A) 07/06/2020 1706   LABSPEC 1.008 07/06/2020 1706   PHURINE 5.0 07/06/2020 1706   GLUCOSEU NEGATIVE 07/06/2020 1706   HGBUR NEGATIVE 07/06/2020 1706   BILIRUBINUR NEGATIVE 07/06/2020 1706   BILIRUBINUR negative 05/11/2020 1152   KETONESUR NEGATIVE 07/06/2020 1706   PROTEINUR 100 (A) 07/06/2020 1706   UROBILINOGEN 0.2 05/11/2020 1152   NITRITE NEGATIVE 07/06/2020 1706   LEUKOCYTESUR SMALL (A) 07/06/2020 1706    Radiological Exams on Admission: DG Chest Port 1 View  Result Date: 10/09/2020 CLINICAL DATA:  Chest pain EXAM: PORTABLE CHEST 1 VIEW COMPARISON:  07/06/2020 FINDINGS: Cardiomegaly and hilar vascular enlargement which is stable. No Kerley lines, effusion, or pneumothorax. No air bronchogram. Artifact from EKG leads. IMPRESSION: Cardiomegaly and vascular congestion. Electronically Signed   By: Monte Fantasia M.D.   On: 10/09/2020 04:12    EKG: Independently reviewed.,  Chronic ST changes.  Assessment/Plan Active Problems:   Angina pectoris (HCC)   Angina at rest [facility])  (please populate well all problems here in Problem List. (For example, if patient is on BP meds at home and you resume or decide to hold them, it is a problem that  needs to be her. Same for CAD, COPD, HLD and so on)  Unstable angina -We will discuss with cardiology about starting heparin drip.  Patient already on aspirin and beta-blocker.  And statin. -Etiology of angina considered related to severe aortic stenosis +/-unk diagnosis CAD, however patient reported tha the past cardiology proposed to further work-up including cardiocath, but had to be postponed due to uncertainty of patient's kidney function and dialysis plan. -Another possibility to concerning her chest pain is her severe uremia and possible uremic pericarditis is well given her chest pain symptoms is worsening with lying down and somewhat relieved with sitting up.  Discussed with on-call nephrologist, who will see the patient emergently this afternoon to decide whether to start HD earlier rather than later. -Echocardiogram.  Refractory HTN -Blood pressure stable, continue current home BP regimen.  Hypokalemia -Patient still makes urine, p.o. replacement and recheck in the a.m.  MDS with chronic normocytic anemia -With recent history of GI bleed and anemia, CBC pending, will decide transfusion upon level available.  Moderate pulmonary hypertension -Continue home dose of sildenafil  IDDM -Continue home Lantus -Add sliding scale  Hypothyroidism -Continue Synthroid  DVT prophylaxis: Heparin subQ Code Status: Full code Family Communication: None at bedside Disposition Plan: Expect more than 2 midnight hospital stay, long-term prognosis guarded given complicated advanced condition the above heart and kidney. Consults called: Nephrology and cardiology Admission status: PCU   Lequita Halt MD [facility] Pager [phone]  10/09/2020, 2:11 PM

## 2020-10-09 NOTE — Progress Notes (Signed)
  Echocardiogram 2D Echocardiogram has been performed.  Patricia Mata 10/09/2020, 3:42 PM

## 2020-10-09 NOTE — Progress Notes (Signed)
Heart Failure Navigator Progress Note  Assessed for Heart & Vascular TOC clinic readiness.  Unfortunately at this time the patient does not meet criteria due to CKD V, SCr > 4.   Navigator available for reassessment of patient.   Pricilla Holm, RN, BSN Heart Failure Nurse Navigator [phone]

## 2020-10-09 NOTE — Consult Note (Signed)
[facility]  INPATIENT CONSULTATION  Reason for Consultation: CKD 5 Requesting Provider: Dr. [name] Locks  HPI: Patricia Mata is an 65 y.o. female with CKD 5, HTN, AS (severe), mitral stenosis (mod/severe), gout, pulmonary HTN, DM, MDS, gout who presented to the ED today with chest pain and is seen for AKI on CKD.    She presented today with several day history of SSCP worse while supine.  No f/c/cough/dyspnea.  She's been started on heparin gtt for unstable angina and cardiology is consulted.   Labs on presentation showing Na 132, K 3.1, Bicarb 20, BUN 123, Cr 4.05.  Trop 44 > 155. CXR with vascular congestion and cardiomegaly. TTE pending. She's had some NTG and IV pain medication with small improvement in pain but now has pruritis which she didn't have before.   She follows with Dr. Carolin Sicks at [facility] - last visit 07/2020 and she was being set up for HD.  She had an AVF placed 3 weeks ago by Dr. Donzetta Matters.  No dysgeusia, hiccups, AMS, pruritis prior to opioid here.   Further eval/treatment of valvular heart disease on hold given advanced CKD until clear plans for HD were arranged.    [facility] hospitalized 12/23-26 for volume overload and AKI - Cr peaked 6.45, managed medically.  PMH: Past Medical History:  Diagnosis Date  . Acute renal failure (ARF) ([place]) 06/08/2018  . Carotid artery disease ([place])   . Chronic diastolic (congestive) heart failure ([place])   . Colon cancer ([place])   . Diabetes mellitus ([place])   . Gout 09/08/2018  . Hypertension   . Hypothyroidism   . Macrocytic anemia   . MDS (myelodysplastic syndrome) ([place])   . Moderate mitral stenosis   . PAD (peripheral artery disease) ([place])   . PAF (paroxysmal atrial fibrillation) ([place])   . Pulmonary hypertension ([place])   . Renal artery stenosis (HCC)    a. >60% R renal artery stenosis by duplex 2021, managed medically.  . Severe aortic stenosis   . Stage 4 chronic kidney disease ([place])   . Transfusion-dependent anemia     PSH: Past Surgical History:  Procedure Laterality Date  . AV FISTULA PLACEMENT Right 09/26/2020   Procedure: RIGHT ARM ARTERIOVENOUS (AV) FISTULA CREATION;  Surgeon: Waynetta Sandy, MD;  Location: [facility] OR;  Service: Vascular;  Laterality: Right;  Monitor Anesthesia Care with Regional Block to Right Arm   . COLON SURGERY    . DEBRIDMENT OF DECUBITUS ULCER N/A 06/12/2018   Procedure: DEBRIDMENT OF DECUBITUS ULCER;  Surgeon: Wallace Going, DO;  [facility];  Service: Plastics;  Laterality: N/A;  . ESOPHAGOGASTRODUODENOSCOPY N/A 09/01/2020   Procedure: ESOPHAGOGASTRODUODENOSCOPY (EGD);  Surgeon: Clarene Essex, MD;  Location: [facility];  Service: Endoscopy;  Laterality: N/A;  . GIVENS CAPSULE STUDY N/A 09/01/2020   Procedure: GIVENS CAPSULE STUDY;  Surgeon: Clarene Essex, MD;  [facility];  Service: Endoscopy;  Laterality: N/A;  . IR FLUORO GUIDE PORT INSERTION RIGHT  04/02/2017  . IR REMOVAL TUN ACCESS W/ PORT W/O FL MOD SED  03/16/2019  . IR US GUIDE VASC ACCESS RIGHT  04/02/2017  . RIGHT HEART CATH N/A 04/23/2019   Procedure: RIGHT HEART CATH;  Surgeon: Jolaine Artist, MD;  Location: [place] CV LAB;  Service: Cardiovascular;  Laterality: N/A;  . RIGHT/LEFT HEART CATH AND CORONARY ANGIOGRAPHY N/A 05/21/2018   Procedure: RIGHT/LEFT HEART CATH AND CORONARY ANGIOGRAPHY;  Surgeon: Nelva Bush, MD;  Location: [place] CV LAB;  Service: Cardiovascular;  Laterality:  N/A;  . TUBAL LIGATION     Past Medical History:  Diagnosis Date  . Acute renal failure (ARF) ([place]) 06/08/2018  . Carotid artery disease ([place])   . Chronic diastolic (congestive) heart failure ([place])   . Colon cancer ([place])   . Diabetes mellitus ([place])   . Gout 09/08/2018  . Hypertension   . Hypothyroidism   . Macrocytic anemia   . MDS (myelodysplastic syndrome) ([place])   . Moderate mitral stenosis   . PAD (peripheral artery disease) ([place])   . PAF (paroxysmal atrial fibrillation) ([place])   .  Pulmonary hypertension ([place])   . Renal artery stenosis (HCC)    a. >60% R renal artery stenosis by duplex 2021, managed medically.  . Severe aortic stenosis   . Stage 4 chronic kidney disease ([place])   . Transfusion-dependent anemia     Medications:  I have reviewed the patient's current medications.  (Not in a hospital admission)   ALLERGIES:   Allergies  Allergen Reactions  . Ancef [Cefazolin] Itching    Severe itching- after procedure, ancef was the antibiotic.-04/02/17 Tolerates penicillins  . Lactose Intolerance (Gi) Diarrhea  . Sulfa Antibiotics Rash and Other (See Comments)    Blisters, also    FAM HX: Family History  Problem Relation Age of Onset  . Hypertension Mother   . Diabetes Mother   . Cervical cancer Mother   . Heart attack Father   . Hypertension Sister   . Hypertension Brother   . Hypertension Sister   . Hypertension Sister   . Prostate cancer Brother   . HIV/AIDS Brother     Social History:   reports that she quit smoking about 19 years ago. Her smoking use included cigarettes. She has a 5.00 pack-year smoking history. She has quit using smokeless tobacco. She reports current alcohol use of about 2.0 standard drinks of alcohol per week. She reports that she does not use drugs.  ROS: 12 system ROS neg except per HPI  Blood pressure (!) 133/48, pulse (!) 103, temperature 97.6 F (36.4 C), temperature source Oral, resp. rate 17, height 5\' 4"  (1.626 m), weight 95.4 kg, SpO2 100 %. PHYSICAL EXAM: Gen: awake and alert, looks comfortable on stretcher  Eyes: anicteric ENT: MMM Neck: supple CV:  Tachy, regular, IV/VI SEM, no rub Abd:  soft Lungs: clear, normal WOB GU: no foley Extr:  No edema Neuro: no asterixis, conversant Skin: no rashes   Results for orders placed or performed during the hospital encounter of 10/09/20 (from the past 48 hour(s))  Comprehensive metabolic panel     Status: Abnormal   Collection Time: 10/09/20  5:54 AM  Result  Value Ref Range   Sodium 132 (L) 135 - 145 mmol/L   Potassium 3.1 (L) 3.5 - 5.1 mmol/L   Chloride 98 98 - 111 mmol/L   CO2 20 (L) 22 - 32 mmol/L   Glucose, Bld 208 (H) 70 - 99 mg/dL    Comment: Glucose reference range applies only to samples taken after fasting for at least 8 hours.   BUN 123 (H) 8 - 23 mg/dL   Creatinine, Ser 4.05 (H) 0.44 - 1.00 mg/dL   Calcium 9.0 8.9 - 10.3 mg/dL   Total Protein 6.0 (L) 6.5 - 8.1 g/dL   Albumin 3.1 (L) 3.5 - 5.0 g/dL   AST 113 (H) 15 - 41 U/L   ALT 75 (H) 0 - 44 U/L   Alkaline Phosphatase 110 38 - 126 U/L   Total Bilirubin  1.0 0.3 - 1.2 mg/dL   GFR, Estimated 12 (L) >60 mL/min    Comment: (NOTE) Calculated using the CKD-EPI Creatinine Equation (2021)    Anion gap 14 5 - 15    Comment: Performed at [facility] [address] [place], [place] [address]  Troponin I (High Sensitivity)     Status: Abnormal   Collection Time: 10/09/20  5:54 AM  Result Value Ref Range   Troponin I (High Sensitivity) 44 (H) <18 ng/L    Comment: (NOTE) Elevated high sensitivity troponin I (hsTnI) values and significant  changes across serial measurements may suggest ACS but many other  chronic and acute conditions are known to elevate hsTnI results.  Refer to the Links section for chest pain algorithms and additional  guidance. Performed at [facility], [place] [address], [place], [place] [address]   Lipase, blood     Status: None   Collection Time: 10/09/20  5:54 AM  Result Value Ref Range   Lipase 32 11 - 51 U/L    Comment: Performed at [facility] [address] [place], [place] [address]  Troponin I (High Sensitivity)     Status: Abnormal   Collection Time: 10/09/20  8:45 AM  Result Value Ref Range   Troponin I (High Sensitivity) 115 (HH) <18 ng/L    Comment: CRITICAL RESULT CALLED TO, READ BACK BY AND VERIFIED WITH: KOHUT,N RN @1139  ON [id number] BY FLEMINGS (NOTE) Elevated high sensitivity troponin I (hsTnI) values and significant   changes across serial measurements may suggest ACS but many other  chronic and acute conditions are known to elevate hsTnI results.  Refer to the Links section for chest pain algorithms and additional  guidance. Performed at [facility], [place] [address] [place], [place] [address]    *Note: Due to a large number of results and/or encounters for the requested time period, some results have not been displayed. A complete set of results can be found in Results Review.    DG Chest Port 1 View  Result Date: 10/09/2020 CLINICAL DATA:  Chest pain EXAM: PORTABLE CHEST 1 VIEW COMPARISON:  07/06/2020 FINDINGS: Cardiomegaly and hilar vascular enlargement which is stable. No Kerley lines, effusion, or pneumothorax. No air bronchogram. Artifact from EKG leads. IMPRESSION: Cardiomegaly and vascular congestion. Electronically Signed   By: Monte Fantasia M.D.   On: 10/09/2020 04:12    Assessment/Plan **Chest pain: on heparin gtt for possible ACS, TTE pending.  If she does have a significant pericardial effusion dialysis would be very reasonable but on auscultation I don't think she has one nor does she have a friction rub suggestive of pericarditis.  I did make her aware of this possibility though.   I appreciate cardiology's input.  Should heart cath/contrast be absolutely needed we can support with dialysis as that would likely tip to ESRD.   **CKD 5 with AKI:  BUN 123, Cr 4.05 although this isn't really different than 2 weeks ago with BUN 112, Cr 4.8.  No uremic symptoms on my assessment unless chest pain thought to be uremic in nature - defer to cardiology to guide me on this. Home lasix 80/40 can be continued.    **Anemia on background of MDS:  3/17 Hb 6.0 at which time she was transfused by oncology. I dont see a Hb from today yet - pending currently.  She has had some epistaxis and dark stools for which GI has eval in the past. Transfused to >  7 esp with chest pain.   **Metabolic acidosis:  cont po supplement   **HTN:  Cont home medications. Well controlled currently.   **Pulmonary HTN: on sildenafil.   **A fibrillation, paroxysmal:  On amiodarone.    Will follow, call with clinical status changes.   Justin Mend 10/09/2020, 3:32 PM

## 2020-10-09 NOTE — Progress Notes (Addendum)
Revisit patient. Patient reported has been having frequent episodes of nose bleeding since last month, 2-3 times a week, usually happened at night, and usually last 2-3 hours. Her GI work up recently including EGD showing nonbleeding gastritis and negative colonoscope and capsule endoscope.  And ENT has seen her several times without significant finding.  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 Patient at this time reported she had two episodes of nose bleed this week, among which one episodes happened 2-3 nights ago "lasted 3 hours". But no nose bleed since.  Similar impression documented by hematology's note last week.  Overall, seems uremia and uremic diathesis has been the main issue for her repeated nose bleeding, but since there is no active bleeding, no DDAVP indicated this moment.

## 2020-10-09 NOTE — Progress Notes (Addendum)
Updated Hgb relayed to Dr. Haroldine Laws - initially our team was thinking possible need for cath but the severe anemia does help explain her angina especially in light of her valvular disease. We've communicated our update to IM who plans to transfuse patient. Also called down to ER to speak to nurse and give update to patient. Avoid heparin for now. See our consult note for his addendum shortly. Echo reviewed with EF 50-55%, mildly reduced RV function, severely elevated PASP, moderate MS, severe AS. Study mentions patient was in Afib but per tele review we did not see any overt afib - p wave amplitude challenging to tell at times. Per Dr. Sung Amabile, [place] to resume diet since no plan for cath today. Jenavieve Freda PA-C

## 2020-10-10 ENCOUNTER — Other Ambulatory Visit: Payer: Self-pay

## 2020-10-10 DIAGNOSIS — I214 Non-ST elevation (NSTEMI) myocardial infarction: Secondary | ICD-10-CM

## 2020-10-10 LAB — CBC WITH DIFFERENTIAL/PLATELET
Abs Immature Granulocytes: 0 10*3/uL (ref 0.00–0.07)
Basophils Absolute: 0.1 10*3/uL (ref 0.0–0.1)
Basophils Relative: 1 %
Eosinophils Absolute: 0 10*3/uL (ref 0.0–0.5)
Eosinophils Relative: 0 %
HCT: 23.8 % — ABNORMAL LOW (ref 36.0–46.0)
Hemoglobin: 8.3 g/dL — ABNORMAL LOW (ref 12.0–15.0)
Lymphocytes Relative: 8 %
Lymphs Abs: 0.6 10*3/uL — ABNORMAL LOW (ref 0.7–4.0)
MCH: 31.4 pg (ref 26.0–34.0)
MCHC: 34.9 g/dL (ref 30.0–36.0)
MCV: 90.2 fL (ref 80.0–100.0)
Monocytes Absolute: 0.7 10*3/uL (ref 0.1–1.0)
Monocytes Relative: 9 %
Neutro Abs: 6.6 10*3/uL (ref 1.7–7.7)
Neutrophils Relative %: 82 %
Platelets: 178 10*3/uL (ref 150–400)
RBC: 2.64 MIL/uL — ABNORMAL LOW (ref 3.87–5.11)
RDW: 15.4 % (ref 11.5–15.5)
WBC: 8 10*3/uL (ref 4.0–10.5)
nRBC: 4.5 % — ABNORMAL HIGH (ref 0.0–0.2)
nRBC: 8 /100 WBC — ABNORMAL HIGH

## 2020-10-10 LAB — GLUCOSE, CAPILLARY
Glucose-Capillary: 99 mg/dL (ref 70–99)
Glucose-Capillary: 125 mg/dL — ABNORMAL HIGH (ref 70–99)

## 2020-10-10 LAB — BASIC METABOLIC PANEL
Anion gap: 11 (ref 5–15)
BUN: 153 mg/dL — ABNORMAL HIGH (ref 8–23)
CO2: 24 mmol/L (ref 22–32)
Calcium: 9.5 mg/dL (ref 8.9–10.3)
Chloride: 99 mmol/L (ref 98–111)
Creatinine, Ser: 4.78 mg/dL — ABNORMAL HIGH (ref 0.44–1.00)
GFR, Estimated: 10 mL/min — ABNORMAL LOW (ref >60)
Glucose, Bld: 160 mg/dL — ABNORMAL HIGH (ref 70–99)
Potassium: 5.2 mmol/L — ABNORMAL HIGH (ref 3.5–5.1)
Sodium: 134 mmol/L — ABNORMAL LOW (ref 135–145)

## 2020-10-10 LAB — CBG MONITORING, ED
Glucose-Capillary: 137 mg/dL — ABNORMAL HIGH (ref 70–99)
Glucose-Capillary: 177 mg/dL — ABNORMAL HIGH (ref 70–99)
Glucose-Capillary: 128 mg/dL — ABNORMAL HIGH (ref 70–99)

## 2020-10-10 LAB — PROTIME-INR
INR: 1.4 — ABNORMAL HIGH (ref 0.8–1.2)
Prothrombin Time: 16.9 seconds — ABNORMAL HIGH (ref 11.4–15.2)

## 2020-10-10 LAB — APTT: aPTT: 27 seconds (ref 24–36)

## 2020-10-10 MED ORDER — CHLORHEXIDINE GLUCONATE CLOTH 2 % EX PADS
6.0000 | MEDICATED_PAD | Freq: Every day | CUTANEOUS | Status: DC
  Administered 2020-10-11 – 2020-10-16 (×5): 6 via TOPICAL

## 2020-10-10 MED ORDER — FUROSEMIDE 10 MG/ML IJ SOLN
60.0000 mg | Freq: Once | INTRAMUSCULAR | Status: AC
  Administered 2020-10-10: 60 mg via INTRAVENOUS
  Filled 2020-10-10 (×2): qty 6

## 2020-10-10 MED ORDER — ALLOPURINOL 100 MG PO TABS
100.0000 mg | ORAL_TABLET | Freq: Every day | ORAL | Status: DC
Start: 2020-10-11 — End: 2020-10-16
  Administered 2020-10-11 – 2020-10-16 (×6): 100 mg via ORAL
  Filled 2020-10-10 (×6): qty 1

## 2020-10-10 MED ORDER — CARVEDILOL 6.25 MG PO TABS
6.2500 mg | ORAL_TABLET | Freq: Two times a day (BID) | ORAL | Status: DC
Start: 2020-10-10 — End: 2020-10-16
  Administered 2020-10-10 – 2020-10-15 (×10): 6.25 mg via ORAL
  Filled 2020-10-10 (×11): qty 1

## 2020-10-10 NOTE — Progress Notes (Addendum)
[facility] Progress Note   Assessment/Plan  Mylisa Brunson is an 65 y.o. female with CKD 5, HTN, AS (severe), mitral stenosis (mod/severe), gout, pulmonary HTN, DM, MDS, gout who presented with chest pain, found to have severe anemia in the 4s and is seen for AKI on CKD.   **Chest pain: thought to be demand ischemia in setting of severe anemia and AS.  Cardiology is following.  TTE with trivial effusion.  No signs of pericarditis on EKG and no rub.   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  With volume overload and I suspect uremic platelet dysfunction leading to the nose bleeds and life threatening anemia,  I have discussed with her that I believe we should initiate RRT while she is hospitalized.  She will need an HD catheter placed as her AVF is still maturing.  Given her AS she may have poor tolerance of HD but we will attempt to manage.  Cardiology is discussing timing of valve replacement.  I had a long discussion with she and sister and will return to the room later this AM to d/w her husband.    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  She's required multiple transfusions in the past and has had epistaxis recently.    **Metabolic acidosis: cont po supplement for now, stop after a few HD treatments.    **HTN:  Cont home medications. Well controlled currently.   **Pulmonary HTN: on sildenafil.   **A fibrillation, paroxysmal:  On amiodarone.    Will follow, call with clinical status changes.   Subjective/Interval Events:   Developed hypoxia requiring [place] supplemental O2 while being transfused overnight.  Given lasix 60 IV.  No UOP documented she says she is urinating.  Labs for the AM are pending.   Objective Vitals:   10/10/20 0449 10/10/20 0500 10/10/20 0550 10/10/20 0720  BP: 137/65 125/67 137/81 (!)  143/87  Pulse: 64 62 66 62  Resp: 20 18 14 18   Temp: 98.3 F (36.8 C)   98.1 F (36.7 C)  TempSrc: Oral   Oral  SpO2: 98% 100% 96% 99%  Weight:      Height:       Physical Exam Gen: awake and alert, looks comfortable on stretcher  Eyes: anicteric ENT: MMM Neck: supple CV:  Tachy, regular, IV/VI SEM, no rub Abd:  soft Lungs: normal WOB on 2L [place], dec BS bases GU: no foley Extr:  No edema Neuro: no asterixis, conversant Skin: no rashes  Additional Objective Labs: Basic Metabolic Panel: Recent Labs  Lab 10/09/20 0554  NA 132*  K 3.1*  CL 98  CO2 20*  GLUCOSE 208*  BUN 123*  CREATININE 4.05*  CALCIUM 9.0   Liver Function Tests: Recent Labs  Lab 10/09/20 0554  AST 113*  ALT 75*  ALKPHOS 110  BILITOT 1.0  PROT 6.0*  ALBUMIN 3.1*   Recent Labs  Lab 10/09/20 0554  LIPASE 32   CBC: Recent Labs  Lab 10/09/20 1540  WBC 6.7  NEUTROABS 5.5  HGB 4.6*  HCT 14.2*  MCV 97.9  PLT 196   Blood Culture    Component Value Date/Time   SDES  09/24/2019 1144    TRACHEAL ASPIRATE Performed at [facility] [place] [address], [place], [place] [address]  SPECREQUEST  09/24/2019 1144    NONE Performed at [facility], [place] [address], [place], [place] [address]    CULT FEW [facility] 09/24/2019 1144   REPTSTATUS 09/27/2019 FINAL 09/24/2019 1144    Cardiac Enzymes: No results for input(s): CKTOTAL, CKMB, CKMBINDEX, TROPONINI in the last 168 hours. CBG: Recent Labs  Lab 10/09/20 1748 10/09/20 1843 10/10/20 0126  GLUCAP 153* 142* 137*   Iron Studies: No results for input(s): IRON, TIBC, TRANSFERRIN, FERRITIN in the last 72 hours. @lablastinr3 @ Studies/Results: DG Chest Port 1 View  Result Date: 10/09/2020 CLINICAL DATA:  Chest pain EXAM: PORTABLE CHEST 1 VIEW COMPARISON:  07/06/2020 FINDINGS: Cardiomegaly and hilar vascular enlargement which is stable. No Kerley lines, effusion, or pneumothorax. No air  bronchogram. Artifact from EKG leads. IMPRESSION: Cardiomegaly and vascular congestion. Electronically Signed   By: Monte Fantasia M.D.   On: 10/09/2020 04:12   ECHOCARDIOGRAM COMPLETE  Result Date: 10/09/2020    ECHOCARDIOGRAM REPORT   Patient Name:   LAVONNE KINDERMAN Gallego Date of Exam: 10/09/2020 Medical Rec #:  [id number]            Height:       64.0 in Accession #:    [id number]           Weight:       210.3 lb Date of Birth:  [birth year]             BSA:          1.999 m Patient Age:    17 years             BP:           132/51 mmHg Patient Gender: F                    HR:           72 bpm. Exam Location:  Inpatient Procedure: 2D Echo, Cardiac Doppler and Color Doppler Indications:    Pericardial effusion I31.3  History:        Patient has prior history of Echocardiogram examinations, most                 recent 06/07/2020. CHF, Pulmonary HTN, AS, Arrythmias:Atrial                 Fibrillation; Risk Factors:Hypertension and Diabetes.  Sonographer:    Bernadene Person RDCS Referring Phys: [id number] [place]  1. Left ventricular ejection fraction, by estimation, is 50 to 55%. The left ventricle has low normal function. The left ventricle has no regional wall motion abnormalities. The left ventricular internal cavity size was mildly dilated. Left ventricular diastolic parameters are indeterminate.  2. Right ventricular systolic function is mildly reduced. The right ventricular size is normal. There is severely elevated pulmonary artery systolic pressure. The estimated right ventricular systolic pressure is 35.3 mmHg.  3. Left atrial size was mildly dilated.  4. Right atrial size was mildly dilated.  5. The mitral valve is degenerative. Trivial mitral valve regurgitation. Moderate mitral stenosis. The mean mitral valve gradient is 7.0 mmHg with MVA 1.5 cm^2 by VTI. Moderate to severe mitral annular calcification.  6. The aortic valve is tricuspid. Aortic valve regurgitation is mild. Severe aortic valve  stenosis. Aortic valve area, by VTI measures 0.71 cm. Aortic valve mean gradient measures 65.0 mmHg.  7. The inferior vena cava is dilated in size with >50% respiratory variability, suggesting right atrial pressure of 8 mmHg.  8. The patient was in atrial fibrillation. FINDINGS  Left Ventricle: Left ventricular ejection fraction, by estimation, is 50 to 55%. The left ventricle has low normal function. The left ventricle has no regional wall motion abnormalities. The left ventricular internal cavity size was mildly dilated. There is no left ventricular hypertrophy. Left ventricular diastolic parameters are indeterminate. Right Ventricle: The right ventricular size is normal. No increase in right ventricular wall thickness. Right ventricular systolic function is mildly reduced. There is severely elevated pulmonary artery systolic pressure. The tricuspid regurgitant velocity is 4.52 m/s, and with an assumed right atrial pressure of 8 mmHg, the estimated right ventricular systolic pressure is 48.1 mmHg. Left Atrium: Left atrial size was mildly dilated. Right Atrium: Right atrial size was mildly dilated. Pericardium: Trivial pericardial effusion is present. Mitral Valve: The mitral valve is degenerative in appearance. There is moderate calcification of the mitral valve leaflet(s). Moderate to severe mitral annular calcification. Trivial mitral valve regurgitation. Moderate mitral valve stenosis. MV peak gradient, 26.0 mmHg. The mean mitral valve gradient is 7.0 mmHg. Tricuspid Valve: The tricuspid valve is normal in structure. Tricuspid valve regurgitation is trivial. Aortic Valve: The aortic valve is tricuspid. Aortic valve regurgitation is mild. Aortic regurgitation PHT measures 247 msec. Severe aortic stenosis is present. Aortic valve mean gradient measures 65.0 mmHg. Aortic valve peak gradient measures 104.6 mmHg.  Aortic valve area, by VTI measures 0.71 cm. Pulmonic Valve: The pulmonic valve was normal in  structure. Pulmonic valve regurgitation is trivial. Aorta: The aortic root is normal in size and structure. Venous: The inferior vena cava is dilated in size with greater than 50% respiratory variability, suggesting right atrial pressure of 8 mmHg. IAS/Shunts: No atrial level shunt detected by color flow Doppler.  LEFT VENTRICLE PLAX 2D LVIDd:         5.80 cm  Diastology LVIDs:         3.90 cm  LV e' medial:  6.25 cm/s LV PW:         1.00 cm  LV e' lateral: 5.98 cm/s LV IVS:        0.80 cm LVOT diam:     2.00 cm LV SV:         86 LV SV Index:   43 LVOT Area:     3.14 cm  RIGHT VENTRICLE RV S prime:     8.11 cm/s TAPSE (M-mode): 2.1 cm LEFT ATRIUM             Index       RIGHT ATRIUM           Index LA diam:        5.10 cm 2.55 cm/m  RA Area:     19.10 cm LA Vol (A2C):   71.4 ml 35.72 ml/m RA Volume:   50.20 ml  25.12 ml/m LA Vol (A4C):   74.1 ml 37.08 ml/m LA Biplane Vol: 74.3 ml 37.18 ml/m  AORTIC VALVE AV Area (Vmax):    0.71 cm AV Area (Vmean):   0.70 cm AV Area (VTI):     0.71 cm AV Vmax:           511.33 cm/s AV Vmean:          354.000 cm/s AV VTI:            1.217 m AV Peak Grad:      104.6 mmHg AV Mean Grad:      65.0 mmHg LVOT Vmax:  115.50 cm/s LVOT Vmean:        78.900 cm/s LVOT VTI:          0.274 m LVOT/AV VTI ratio: 0.23 AI PHT:            247 msec  AORTA Ao Root diam: 3.20 cm Ao Asc diam:  3.00 cm MITRAL VALVE              TRICUSPID VALVE MV Area (PHT): 2.97 cm   TR Peak grad:   81.7 mmHg MV Area VTI:   1.32 cm   TR Vmax:        452.00 cm/s MV Peak grad:  26.0 mmHg MV Mean grad:  7.0 mmHg   SHUNTS MV Vmax:       2.55 m/s   Systemic VTI:  0.27 m MV Vmean:      113.0 cm/s Systemic Diam: 2.00 cm MR Peak grad: 24.4 mmHg MR Vmax:      247.00 cm/s Loralie Champagne MD Electronically signed by Loralie Champagne MD Signature Date/Time: 10/09/2020/4:36:06 PM    Final    Medications:  . allopurinol  300 mg Oral Daily  . amiodarone  200 mg Oral Daily  . aspirin  324 mg Oral NOW   Or  . aspirin   300 mg Rectal NOW  . atorvastatin  40 mg Oral Daily  . carvedilol  6.25 mg Oral BID  . cloNIDine  0.1 mg Oral BID  . fluticasone  1 spray Each Nare Daily  . furosemide  40 mg Oral Daily  . hydrALAZINE  100 mg Oral TID  . insulin aspart  0-9 Units Subcutaneous TID WC  . insulin glargine  20 Units Subcutaneous Daily  . latanoprost  1 drop Both Eyes QHS  . levothyroxine  150 mcg Oral QAC breakfast  . linagliptin  5 mg Oral Daily  . loratadine  10 mg Oral QODAY  .  morphine injection  4 mg Intravenous Once  . pantoprazole  40 mg Oral Daily  . sildenafil  20 mg Oral TID  . sodium bicarbonate  650 mg Oral BID        Jannifer Hick MD 10/10/2020, 7:45 AM  [facility]

## 2020-10-10 NOTE — ED Notes (Signed)
Blood restarted at 14mL/hr.

## 2020-10-10 NOTE — Progress Notes (Signed)
PROGRESS NOTE    Patricia Mata  DUK:[id number] DOB: 1956-02-21 DOA: 10/09/2020 PCP: [name], FNP     Brief Narrative:  Patricia Mata is a 65 y.o. female with medical history significant of CKD stage V, refractory HTN, severe aortic stenosis, moderate to severe mitral stenosis, moderate pulmonary HTN, IDDM, MDS, gout, presented with chest pain.    New events last 24 hours / Subjective: Feeling well this morning.  After blood transfusion, her chest pain has completely resolved.  Discussed with Dr. Johnney Ou, planning to initiate dialysis during this hospital stay.  Assessment & Plan:   Principal Problem:   NSTEMI (non-ST elevated myocardial infarction) ([place]) Active Problems:   Anemia due to chronic kidney disease   Anemia in chronic kidney disease   MDS (myelodysplastic syndrome) (HCC)   Essential hypertension   Aortic stenosis, severe   Carotid artery disease (HCC)   PAF (paroxysmal atrial fibrillation) (HCC)   Hypothyroidism   Pulmonary hypertension (HCC)   DM2 (diabetes mellitus, type 2) (HCC)   Elevated troponin   Chronic kidney disease, stage 5 (HCC)   Symptomatic anemia   Angina pectoris (HCC)   Angina at rest [facility])   NSTEMI -Troponin up to [place] cardiology, following for left heart cath  Severe aortic stenosis, moderate mitral stenosis -Cardiology following  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  IR consulted for tunneled dialysis catheter placement  Paroxysmal atrial fibrillation -Continue amiodarone  HTN -Continue coreg, catapres, lasix   HLD -Continue lipitor   Pulmonary hypertension -Continue sildenafil  Symptomatic anemia History of MDS Chronic blood loss anemia, recurrent epistaxis Anemia of chronic kidney disease  -Status post 3 unit packed red blood cell transfusion  Insulin-dependent diabetes mellitus -Continue Lantus, sliding scale insulin, tradjenta   Hypothyroidism -Continue  Synthroid   DVT prophylaxis:  Place and maintain sequential compression device Start: 10/09/20 1657  Code Status: Full code Family Communication: At bedside Disposition Plan:  Status is: Inpatient  Remains inpatient appropriate because:Ongoing diagnostic testing needed not appropriate for outpatient work up, IV treatments appropriate due to intensity of illness or inability to take PO and Inpatient level of care appropriate due to severity of illness   Dispo: The patient is from: Home              Anticipated d/c is to: Home              Patient currently is not medically stable to d/c.   Difficult to place patient No   Consultants:   Cardiology  Nephrology   Antimicrobials:  Anti-infectives (From admission, onward)   None        Objective: Vitals:   10/10/20 1130 10/10/20 1200 10/10/20 1306 10/10/20 1323  BP: (!) 139/38 (!) 121/109 (!) 155/62 (!) 135/51  Pulse: 67 66 63 69  Resp: 15 13 18 17   Temp:   98.4 F (36.9 C) 98.4 F (36.9 C)  TempSrc:   Oral Oral  SpO2: 97% 93% 95% 100%  Weight:      Height:        Intake/Output Summary (Last 24 hours) at 10/10/2020 1443 Last data filed at 10/09/2020 2325 Gross per 24 hour  Intake 315 ml  Output --  Net 315 ml   Filed Weights   10/09/20 0353  Weight: 95.4 kg    Examination:  General exam: Appears calm and comfortable  Respiratory system: Clear to auscultation. Respiratory effort normal. No respiratory distress. No conversational dyspnea.  Cardiovascular system: S1 &  S2 heard, RRR.  Nonpitting pedal edema. Gastrointestinal system: Abdomen is nondistended, soft and nontender. Normal bowel sounds heard. Central nervous system: Alert and oriented. No focal neurological deficits. Speech clear.  Extremities: Symmetric in appearance  Skin: No rashes, lesions or ulcers on exposed skin  Psychiatry: Judgement and insight appear normal. Mood & affect appropriate.   Data Reviewed: I have personally reviewed  following labs and imaging studies  CBC: Recent Labs  Lab 10/09/20 1540 10/10/20 0939  WBC 6.7 8.0  NEUTROABS 5.5 6.6  HGB 4.6* 8.3*  HCT 14.2* 23.8*  MCV 97.9 90.2  PLT 196 062   Basic Metabolic Panel: Recent Labs  Lab 10/09/20 0554 10/10/20 0939  NA 132* 134*  K 3.1* 5.2*  CL 98 99  CO2 20* 24  GLUCOSE 208* 160*  BUN 123* 153*  CREATININE 4.05* 4.78*  CALCIUM 9.0 9.5   GFR: Estimated Creatinine Clearance: 13.2 mL/min (A) (by C-G formula based on SCr of 4.78 mg/dL (H)). Liver Function Tests: Recent Labs  Lab 10/09/20 0554  AST 113*  ALT 75*  ALKPHOS 110  BILITOT 1.0  PROT 6.0*  ALBUMIN 3.1*   Recent Labs  Lab 10/09/20 0554  LIPASE 32   No results for input(s): AMMONIA in the last 168 hours. Coagulation Profile: Recent Labs  Lab 10/10/20 1052  INR 1.4*   Cardiac Enzymes: No results for input(s): CKTOTAL, CKMB, CKMBINDEX, TROPONINI in the last 168 hours. BNP (last 3 results) No results for input(s): PROBNP in the last 8760 hours. HbA1C: No results for input(s): HGBA1C in the last 72 hours. CBG: Recent Labs  Lab 10/09/20 1748 10/09/20 1843 10/10/20 0126 10/10/20 0805 10/10/20 1156  GLUCAP 153* 142* 137* 177* 128*   Lipid Profile: No results for input(s): CHOL, HDL, LDLCALC, TRIG, CHOLHDL, LDLDIRECT in the last 72 hours. Thyroid Function Tests: No results for input(s): TSH, T4TOTAL, FREET4, T3FREE, THYROIDAB in the last 72 hours. Anemia Panel: Recent Labs    10/09/20 1656  RETICCTPCT 1.8   Sepsis Labs: No results for input(s): PROCALCITON, LATICACIDVEN in the last 168 hours.  Recent Results (from the past 240 hour(s))  Resp Panel by RT-PCR (Flu A&B, Covid) Nasopharyngeal Swab     Status: None   Collection Time: 10/09/20  1:37 PM   Specimen: Nasopharyngeal Swab; Nasopharyngeal(NP) swabs in vial transport medium  Result Value Ref Range Status   SARS Coronavirus 2 by RT PCR NEGATIVE NEGATIVE Final    Comment: (NOTE) SARS-CoV-2 target  nucleic acids are NOT DETECTED.  The SARS-CoV-2 RNA is generally detectable in upper respiratory specimens during the acute phase of infection. The lowest concentration of SARS-CoV-2 viral copies this assay can detect is 138 copies/mL. A negative result does not preclude SARS-Cov-2 infection and should not be used as the sole basis for treatment or other patient management decisions. A negative result may occur with  improper specimen collection/handling, submission of specimen other than nasopharyngeal swab, presence of viral mutation(s) within the areas targeted by this assay, and inadequate number of viral copies(<138 copies/mL). A negative result must be combined with clinical observations, patient history, and epidemiological information. The expected result is Negative.  Fact Sheet for Patients:  EntrepreneurPulse.com.au  Fact Sheet for Healthcare Providers:  IncredibleEmployment.be  This test is no t yet approved or cleared by the [place] FDA and  has been authorized for detection and/or diagnosis of SARS-CoV-2 by FDA under an Emergency Use Authorization (EUA). This EUA will remain  in effect (meaning this test can be  used) for the duration of the COVID-19 declaration under Section 564(b)(1) of the Act, 21 U.S.C.section 360bbb-3(b)(1), unless the authorization is terminated  or revoked sooner.       Influenza A by PCR NEGATIVE NEGATIVE Final   Influenza B by PCR NEGATIVE NEGATIVE Final    Comment: (NOTE) The Xpert Xpress SARS-CoV-2/FLU/RSV plus assay is intended as an aid in the diagnosis of influenza from Nasopharyngeal swab specimens and should not be used as a sole basis for treatment. Nasal washings and aspirates are unacceptable for Xpert Xpress SARS-CoV-2/FLU/RSV testing.  Fact Sheet for Patients: EntrepreneurPulse.com.au  Fact Sheet for Healthcare  Providers: IncredibleEmployment.be  This test is not yet approved or cleared by the [place] FDA and has been authorized for detection and/or diagnosis of SARS-CoV-2 by FDA under an Emergency Use Authorization (EUA). This EUA will remain in effect (meaning this test can be used) for the duration of the COVID-19 declaration under Section 564(b)(1) of the Act, 21 U.S.C. section 360bbb-3(b)(1), unless the authorization is terminated or revoked.  Performed at [facility], [place] [address] [place], [place] [address]       Radiology Studies: DG Chest Port 1 View  Result Date: 10/09/2020 CLINICAL DATA:  Chest pain EXAM: PORTABLE CHEST 1 VIEW COMPARISON:  07/06/2020 FINDINGS: Cardiomegaly and hilar vascular enlargement which is stable. No Kerley lines, effusion, or pneumothorax. No air bronchogram. Artifact from EKG leads. IMPRESSION: Cardiomegaly and vascular congestion. Electronically Signed   By: Monte Fantasia M.D.   On: 10/09/2020 04:12   ECHOCARDIOGRAM COMPLETE  Result Date: 10/09/2020    ECHOCARDIOGRAM REPORT   Patient Name:   MAKIA BOSSI Augenstein Date of Exam: 10/09/2020 Medical Rec #:  [id number]            Height:       64.0 in Accession #:    [id number]           Weight:       210.3 lb Date of Birth:  08-11-1955             BSA:          1.999 m Patient Age:    26 years             BP:           132/51 mmHg Patient Gender: F                    HR:           72 bpm. Exam Location:  Inpatient Procedure: 2D Echo, Cardiac Doppler and Color Doppler Indications:    Pericardial effusion I31.3  History:        Patient has prior history of Echocardiogram examinations, most                 recent 06/07/2020. CHF, Pulmonary HTN, AS, Arrythmias:Atrial                 Fibrillation; Risk Factors:Hypertension and Diabetes.  Sonographer:    Bernadene Person RDCS Referring Phys: [id number] [place]  1. Left ventricular ejection fraction, by estimation, is 50 to 55%.  The left ventricle has low normal function. The left ventricle has no regional wall motion abnormalities. The left ventricular internal cavity size was mildly dilated. Left ventricular diastolic parameters are indeterminate.  2. Right ventricular systolic function is mildly reduced. The right ventricular size is normal. There is severely elevated pulmonary artery systolic pressure. The estimated right ventricular  systolic pressure is 96.0 mmHg.  3. Left atrial size was mildly dilated.  4. Right atrial size was mildly dilated.  5. The mitral valve is degenerative. Trivial mitral valve regurgitation. Moderate mitral stenosis. The mean mitral valve gradient is 7.0 mmHg with MVA 1.5 cm^2 by VTI. Moderate to severe mitral annular calcification.  6. The aortic valve is tricuspid. Aortic valve regurgitation is mild. Severe aortic valve stenosis. Aortic valve area, by VTI measures 0.71 cm. Aortic valve mean gradient measures 65.0 mmHg.  7. The inferior vena cava is dilated in size with >50% respiratory variability, suggesting right atrial pressure of 8 mmHg.  8. The patient was in atrial fibrillation. FINDINGS  Left Ventricle: Left ventricular ejection fraction, by estimation, is 50 to 55%. The left ventricle has low normal function. The left ventricle has no regional wall motion abnormalities. The left ventricular internal cavity size was mildly dilated. There is no left ventricular hypertrophy. Left ventricular diastolic parameters are indeterminate. Right Ventricle: The right ventricular size is normal. No increase in right ventricular wall thickness. Right ventricular systolic function is mildly reduced. There is severely elevated pulmonary artery systolic pressure. The tricuspid regurgitant velocity is 4.52 m/s, and with an assumed right atrial pressure of 8 mmHg, the estimated right ventricular systolic pressure is 45.4 mmHg. Left Atrium: Left atrial size was mildly dilated. Right Atrium: Right atrial size was mildly  dilated. Pericardium: Trivial pericardial effusion is present. Mitral Valve: The mitral valve is degenerative in appearance. There is moderate calcification of the mitral valve leaflet(s). Moderate to severe mitral annular calcification. Trivial mitral valve regurgitation. Moderate mitral valve stenosis. MV peak gradient, 26.0 mmHg. The mean mitral valve gradient is 7.0 mmHg. Tricuspid Valve: The tricuspid valve is normal in structure. Tricuspid valve regurgitation is trivial. Aortic Valve: The aortic valve is tricuspid. Aortic valve regurgitation is mild. Aortic regurgitation PHT measures 247 msec. Severe aortic stenosis is present. Aortic valve mean gradient measures 65.0 mmHg. Aortic valve peak gradient measures 104.6 mmHg.  Aortic valve area, by VTI measures 0.71 cm. Pulmonic Valve: The pulmonic valve was normal in structure. Pulmonic valve regurgitation is trivial. Aorta: The aortic root is normal in size and structure. Venous: The inferior vena cava is dilated in size with greater than 50% respiratory variability, suggesting right atrial pressure of 8 mmHg. IAS/Shunts: No atrial level shunt detected by color flow Doppler.  LEFT VENTRICLE PLAX 2D LVIDd:         5.80 cm  Diastology LVIDs:         3.90 cm  LV e' medial:  6.25 cm/s LV PW:         1.00 cm  LV e' lateral: 5.98 cm/s LV IVS:        0.80 cm LVOT diam:     2.00 cm LV SV:         86 LV SV Index:   43 LVOT Area:     3.14 cm  RIGHT VENTRICLE RV S prime:     8.11 cm/s TAPSE (M-mode): 2.1 cm LEFT ATRIUM             Index       RIGHT ATRIUM           Index LA diam:        5.10 cm 2.55 cm/m  RA Area:     19.10 cm LA Vol (A2C):   71.4 ml 35.72 ml/m RA Volume:   50.20 ml  25.12 ml/m LA Vol (A4C):   74.1  ml 37.08 ml/m LA Biplane Vol: 74.3 ml 37.18 ml/m  AORTIC VALVE AV Area (Vmax):    0.71 cm AV Area (Vmean):   0.70 cm AV Area (VTI):     0.71 cm AV Vmax:           511.33 cm/s AV Vmean:          354.000 cm/s AV VTI:            1.217 m AV Peak Grad:       104.6 mmHg AV Mean Grad:      65.0 mmHg LVOT Vmax:         115.50 cm/s LVOT Vmean:        78.900 cm/s LVOT VTI:          0.274 m LVOT/AV VTI ratio: 0.23 AI PHT:            247 msec  AORTA Ao Root diam: 3.20 cm Ao Asc diam:  3.00 cm MITRAL VALVE              TRICUSPID VALVE MV Area (PHT): 2.97 cm   TR Peak grad:   81.7 mmHg MV Area VTI:   1.32 cm   TR Vmax:        452.00 cm/s MV Peak grad:  26.0 mmHg MV Mean grad:  7.0 mmHg   SHUNTS MV Vmax:       2.55 m/s   Systemic VTI:  0.27 m MV Vmean:      113.0 cm/s Systemic Diam: 2.00 cm MR Peak grad: 24.4 mmHg MR Vmax:      247.00 cm/s Loralie Champagne MD Electronically signed by Loralie Champagne MD Signature Date/Time: 10/09/2020/4:36:06 PM    Final       Scheduled Meds: . Derrill Memo ON 10/11/2020] allopurinol  100 mg Oral Daily  . amiodarone  200 mg Oral Daily  . atorvastatin  40 mg Oral Daily  . carvedilol  6.25 mg Oral BID  . Chlorhexidine Gluconate Cloth  6 each Topical Q0600  . cloNIDine  0.1 mg Oral BID  . fluticasone  1 spray Each Nare Daily  . furosemide  40 mg Oral Daily  . hydrALAZINE  100 mg Oral TID  . insulin aspart  0-9 Units Subcutaneous TID WC  . insulin glargine  20 Units Subcutaneous Daily  . latanoprost  1 drop Both Eyes QHS  . levothyroxine  150 mcg Oral QAC breakfast  . linagliptin  5 mg Oral Daily  . loratadine  10 mg Oral QODAY  . pantoprazole  40 mg Oral Daily  . sildenafil  20 mg Oral TID  . sodium bicarbonate  650 mg Oral BID   Continuous Infusions:   LOS: 1 day      Time spent: 35 minutes   Dessa Phi, DO [facility] 10/10/2020, 2:43 PM   Available via Epic secure chat 7am-7pm After these hours, please refer to coverage provider listed on [email or web address]

## 2020-10-10 NOTE — ED Notes (Signed)
Pts 02=88% during blood transfusion. Transfusion stopped, MD notified.

## 2020-10-10 NOTE — Plan of Care (Signed)

## 2020-10-10 NOTE — Progress Notes (Signed)
IR consulted by Dr. Johnney Ou for possible image-guided tunneled HD catheter placement.  Unable to accommodate patient's procedure today secondary to IR schedule. Diet restarted- patient made NPO at midnight in anticipation for possible IR procedure 10/11/2020. RN updated.  Please call IR with questions/concerns.   Bea Graff Syriah Delisi, PA-C 10/10/2020, 4:26 PM

## 2020-10-10 NOTE — Progress Notes (Signed)
Chief Complaint: Patient was seen in consultation today for placement of tunneled HD catheter  Referring Physician(s): Dr. Johnney Ou  Supervising Physician: Ruthann Cancer  Patient Status: [facility] - In-pt  History of Present Illness: Patricia Mata is a 65 y.o. female with CKD V with recent AVF creation(not yet mature), severe aortic and mitral stenosis, pulm HTN, MDS presented with chest pain as well as AKI on CKD. Cardiology consulted for chest pain, felt to be demand ischemia from AS, per their note, no immediate plans for heart cath. Dr. Johnney Ou has discssed with pt and feels pt needs to start HD. AVF not yet mature so IR is asked to place tunneled HD cath. Pt also admitted with profound anemia, Hgb 4.6, has received 3 units PRBC, repeat labs pending. PMHx, meds, labs, imaging, allergies reviewed. Has been NPO today as directed. Family at bedside.   Past Medical History:  Diagnosis Date  . Acute renal failure (ARF) ([place]) 06/08/2018  . Carotid artery disease ([place])   . Chronic diastolic (congestive) heart failure ([place])   . Colon cancer ([place])   . Diabetes mellitus ([place])   . Gout 09/08/2018  . Hypertension   . Hypothyroidism   . Macrocytic anemia   . MDS (myelodysplastic syndrome) ([place])   . Moderate mitral stenosis   . PAD (peripheral artery disease) ([place])   . PAF (paroxysmal atrial fibrillation) ([place])   . Pulmonary hypertension ([place])   . Renal artery stenosis ([facility])    a. >60% R renal artery stenosis by duplex 2021, managed medically.  . Severe aortic stenosis   . Stage 4 chronic kidney disease ([place])   . Transfusion-dependent anemia     Past Surgical History:  Procedure Laterality Date  . AV FISTULA PLACEMENT Right 09/26/2020   Procedure: RIGHT ARM ARTERIOVENOUS (AV) FISTULA CREATION;  Surgeon: Waynetta Sandy, MD;  Location: [facility] OR;  Service: Vascular;  Laterality: Right;  Monitor Anesthesia Care with Regional Block to Right Arm   . COLON SURGERY    .  DEBRIDMENT OF DECUBITUS ULCER N/A 06/12/2018   Procedure: DEBRIDMENT OF DECUBITUS ULCER;  Surgeon: Wallace Going, DO;  Location: [facility] ORS;  Service: Plastics;  Laterality: N/A;  . ESOPHAGOGASTRODUODENOSCOPY N/A 09/01/2020   Procedure: ESOPHAGOGASTRODUODENOSCOPY (EGD);  Surgeon: Clarene Essex, MD;  Location: [facility];  Service: Endoscopy;  Laterality: N/A;  . GIVENS CAPSULE STUDY N/A 09/01/2020   Procedure: GIVENS CAPSULE STUDY;  Surgeon: Clarene Essex, MD;  Location: [facility] ENDOSCOPY;  Service: Endoscopy;  Laterality: N/A;  . IR FLUORO GUIDE PORT INSERTION RIGHT  04/02/2017  . IR REMOVAL TUN ACCESS W/ PORT W/O FL MOD SED  03/16/2019  . IR US GUIDE VASC ACCESS RIGHT  04/02/2017  . RIGHT HEART CATH N/A 04/23/2019   Procedure: RIGHT HEART CATH;  Surgeon: Jolaine Artist, MD;  Location: [facility] LAB;  Service: Cardiovascular;  Laterality: N/A;  . RIGHT/LEFT HEART CATH AND CORONARY ANGIOGRAPHY N/A 05/21/2018   Procedure: RIGHT/LEFT HEART CATH AND CORONARY ANGIOGRAPHY;  Surgeon: Nelva Bush, MD;  Location: [facility];  Service: Cardiovascular;  Laterality: N/A;  . TUBAL LIGATION      Allergies: Ancef [cefazolin], Lactose intolerance (gi), and Sulfa antibiotics  Medications: Prior to Admission medications   Medication Sig Start Date End Date Taking? Authorizing Provider  allopurinol (ZYLOPRIM) 300 MG tablet Take 300 mg by mouth daily. 07/05/20  Yes [provider]  amiodarone (PACERONE) 200 MG tablet Take 1 tablet (200 mg total) by mouth daily. 07/19/20  Yes Bensimhon, Shaune Pascal, MD  atorvastatin (LIPITOR) 40 MG tablet Take 1 tablet by mouth once daily Patient taking differently: Take 40 mg by mouth daily. 08/09/20  Yes [name], FNP  blood glucose meter kit and supplies Dispense based on patient and insurance preference. Use up to four times daily as directed. (FOR ICD-10 E10.9, E11.9). 11/04/18  Yes [name], FNP  Blood Glucose Monitoring Suppl [facility] Karsten Fells)  w/Device KIT Check blood sugars twice daily E11.9 11/30/19  Yes [name], FNP  carvedilol (COREG) 6.25 MG tablet Take 1 tablet (6.25 mg total) by mouth 2 (two) times daily. 10/04/20 11/03/20 Yes Bensimhon, Shaune Pascal, MD  cloNIDine (CATAPRES) 0.1 MG tablet Take 1 tablet (0.1 mg total) by mouth 2 (two) times daily. 07/03/20  Yes Bensimhon, Shaune Pascal, MD  fluticasone (FLONASE) 50 MCG/ACT nasal spray Place 1 spray into both nostrils daily. 01/28/20  Yes Collene Gobble, MD  furosemide (LASIX) 40 MG tablet Take 1 tablet (40 mg total) by mouth daily. Take 80 mg in AM and 40 mg in PM (4PM) 07/09/20  Yes Debbe Odea, MD  hydrALAZINE (APRESOLINE) 100 MG tablet Take 1 tablet (100 mg total) by mouth 3 (three) times daily. 09/27/20  Yes Lorretta Harp, MD  Insulin Glargine [facility]) 100 UNIT/ML Inject 20 Units into the skin daily. 09/05/20  Yes [name], FNP  Insulin Pen Needle 29G X 5MM MISC Use as directed 06/16/17  Yes Bonnielee Haff, MD  Lancets (ONETOUCH DELICA PLUS [id number]) [place] check blood sugars twice daily Dx code: E11.22 03/02/20  Yes [name], FNP  latanoprost (XALATAN) 0.005 % ophthalmic solution Place 1 drop into both eyes at bedtime.    Yes [provider]  levothyroxine (SYNTHROID) 150 MCG tablet Take 1 tablet (150 mcg total) by mouth daily before breakfast. 09/07/20  Yes [name], FNP  linagliptin (TRADJENTA) 5 MG TABS tablet Take 1 tablet (5 mg total) by mouth daily. 02/03/20  Yes [name], FNP  loratadine (CLARITIN) 10 MG tablet Take 10 mg by mouth daily.   Yes [provider]  Multiple Vitamins-Minerals (CENTRUM SILVER 50+WOMEN) TABS Take 1 tablet by mouth daily.   Yes [provider]  omeprazole (PRILOSEC) 40 MG capsule Take 1 capsule (40 mg total) by mouth 2 (two) times daily before a meal. 09/27/20 09/27/21 Yes [name], FNP  sildenafil (REVATIO) 20 MG tablet Take 1 tablet (20 mg total) by mouth 3 (three) times daily. 09/06/20  Yes  Bensimhon, Shaune Pascal, MD  sodium bicarbonate 650 MG tablet Take 650 mg by mouth 2 (two) times daily. 06/27/20  Yes [provider]  sodium chloride (OCEAN) 0.65 % nasal spray Place 1 spray into the nose in the morning, at noon, in the evening, and at bedtime.   Yes [provider]  glucose blood test strip check blood sugars twice daily Dx code E11.22 03/02/20   [name], FNP     Family History  Problem Relation Age of Onset  . Hypertension Mother   . Diabetes Mother   . Cervical cancer Mother   . Heart attack Father   . Hypertension Sister   . Hypertension Brother   . Hypertension Sister   . Hypertension Sister   . Prostate cancer Brother   . HIV/AIDS Brother     Social History   Socioeconomic History  . Marital status: Married    Spouse name: Not on file  . Number of children: Not on file  . Years of  education: Not on file  . Highest education level: Not on file  Occupational History  . Occupation: retired  Tobacco Use  . Smoking status: Former Smoker    Packs/day: 0.25    Years: 20.00    Pack years: 5.00    Types: Cigarettes    Quit date: 01/31/2001    Years since quitting: 19.7  . Smokeless tobacco: Former Network engineer  . Vaping Use: Never used  Substance and Sexual Activity  . Alcohol use: Yes    Alcohol/week: 2.0 standard drinks    Types: 2 Shots of liquor per week    Comment: occ  . Drug use: Never  . Sexual activity: Not on file  Other Topics Concern  . Not on file  Social History Narrative  . Not on file   Social Determinants of Health   Financial Resource Strain: Medium Risk  . Difficulty of Paying Living Expenses: Somewhat hard  Food Insecurity: Not on file  Transportation Needs: Not on file  Physical Activity: Not on file  Stress: Not on file  Social Connections: Not on file    Review of Systems: A 12 point ROS discussed and pertinent positives are indicated in the HPI above.  All other systems are negative.  Review  of Systems  Vital Signs: BP (!) 159/81   Pulse (!) 59   Temp 98.1 F (36.7 C) (Oral)   Resp 14   Ht '5\' 4"'  (1.626 m)   Wt 95.4 kg   SpO2 94%   BMI 36.10 kg/m   Physical Exam Constitutional:      Appearance: Normal appearance. She is not ill-appearing.  HENT:     Mouth/Throat:     Mouth: Mucous membranes are moist.     Pharynx: Oropharynx is clear.  Cardiovascular:     Rate and Rhythm: Normal rate and regular rhythm.  Pulmonary:     Effort: Pulmonary effort is normal. No respiratory distress.     Breath sounds: Normal breath sounds.  Skin:    General: Skin is warm and dry.  Neurological:     General: No focal deficit present.     Mental Status: She is alert and oriented to person, place, and time.  Psychiatric:        Mood and Affect: Mood normal.        Thought Content: Thought content normal.     Imaging: DG Chest Port 1 View  Result Date: 10/09/2020 CLINICAL DATA:  Chest pain EXAM: PORTABLE CHEST 1 VIEW COMPARISON:  07/06/2020 FINDINGS: Cardiomegaly and hilar vascular enlargement which is stable. No Kerley lines, effusion, or pneumothorax. No air bronchogram. Artifact from EKG leads. IMPRESSION: Cardiomegaly and vascular congestion. Electronically Signed   By: Monte Fantasia M.D.   On: 10/09/2020 04:12   VAS [place] UPPER EXTREMITY ARTERIAL DUPLEX  Result Date: 09/18/2020 UPPER EXTREMITY DUPLEX STUDY Indications: Pre-operative exam.  Risk Factors: Hypertension, Diabetes. Comparison Study: No prior exam Performing Technologist: Alvia Grove RVT  Examination Guidelines: A complete evaluation includes B-mode imaging, spectral Doppler, color Doppler, and power Doppler as needed of all accessible portions of each vessel. Bilateral testing is considered an integral part of a complete examination. Limited examinations for reoccurring indications may be performed as noted.  Right Pre-Dialysis Findings: +-----------------------+----------+--------------------+----------+--------+  Location               PSV (cm/s)Intralum. Diam. (cm)Waveform  Comments +-----------------------+----------+--------------------+----------+--------+ Brachial Antecub. fossa114       0.44  triphasic          +-----------------------+----------+--------------------+----------+--------+ Radial Art at Wrist    119       0.35                monophasic         +-----------------------+----------+--------------------+----------+--------+ Ulnar Art at Wrist     105       0.22                monophasic         +-----------------------+----------+--------------------+----------+--------+  Left Pre-Dialysis Findings: +-----------------------+----------+--------------------+----------+--------+ Location               PSV (cm/s)Intralum. Diam. (cm)Waveform  Comments +-----------------------+----------+--------------------+----------+--------+ Brachial Antecub. fossa120       0.48                triphasic          +-----------------------+----------+--------------------+----------+--------+ Radial Art at Wrist    124       0.28                monophasic         +-----------------------+----------+--------------------+----------+--------+ Ulnar Art at Wrist     85        0.15                triphasic          +-----------------------+----------+--------------------+----------+--------+  Summary:   Measurements above. *See table(s) above for measurements and observations. Electronically signed by Servando Snare MD on 09/18/2020 at 10:15:27 AM.    Final    ECHOCARDIOGRAM COMPLETE  Result Date: 10/09/2020    ECHOCARDIOGRAM REPORT   Patient Name:   KARIZMA CHEEK [place]vi Date of Exam: 10/09/2020 Medical Rec #:  [id number]            Height:       64.0 in Accession #:    [id number]           Weight:       210.3 lb Date of Birth:  1956-06-03             BSA:          1.999 m Patient Age:    3 years             BP:           132/51 mmHg Patient Gender: F                    HR:            72 bpm. Exam Location:  Inpatient Procedure: 2D Echo, Cardiac Doppler and Color Doppler Indications:    Pericardial effusion I31.3  History:        Patient has prior history of Echocardiogram examinations, most                 recent 06/07/2020. CHF, Pulmonary HTN, AS, Arrythmias:Atrial                 Fibrillation; Risk Factors:Hypertension and Diabetes.  Sonographer:    Bernadene Person RDCS Referring Phys: [id number] [place]  1. Left ventricular ejection fraction, by estimation, is 50 to 55%. The left ventricle has low normal function. The left ventricle has no regional wall motion abnormalities. The left ventricular internal cavity size was mildly dilated. Left ventricular diastolic parameters are indeterminate.  2. Right ventricular systolic function is mildly reduced. The right ventricular size is normal. There is severely elevated pulmonary artery systolic  pressure. The estimated right ventricular systolic pressure is 50.0 mmHg.  3. Left atrial size was mildly dilated.  4. Right atrial size was mildly dilated.  5. The mitral valve is degenerative. Trivial mitral valve regurgitation. Moderate mitral stenosis. The mean mitral valve gradient is 7.0 mmHg with MVA 1.5 cm^2 by VTI. Moderate to severe mitral annular calcification.  6. The aortic valve is tricuspid. Aortic valve regurgitation is mild. Severe aortic valve stenosis. Aortic valve area, by VTI measures 0.71 cm. Aortic valve mean gradient measures 65.0 mmHg.  7. The inferior vena cava is dilated in size with >50% respiratory variability, suggesting right atrial pressure of 8 mmHg.  8. The patient was in atrial fibrillation. FINDINGS  Left Ventricle: Left ventricular ejection fraction, by estimation, is 50 to 55%. The left ventricle has low normal function. The left ventricle has no regional wall motion abnormalities. The left ventricular internal cavity size was mildly dilated. There is no left ventricular hypertrophy. Left ventricular  diastolic parameters are indeterminate. Right Ventricle: The right ventricular size is normal. No increase in right ventricular wall thickness. Right ventricular systolic function is mildly reduced. There is severely elevated pulmonary artery systolic pressure. The tricuspid regurgitant velocity is 4.52 m/s, and with an assumed right atrial pressure of 8 mmHg, the estimated right ventricular systolic pressure is 93.8 mmHg. Left Atrium: Left atrial size was mildly dilated. Right Atrium: Right atrial size was mildly dilated. Pericardium: Trivial pericardial effusion is present. Mitral Valve: The mitral valve is degenerative in appearance. There is moderate calcification of the mitral valve leaflet(s). Moderate to severe mitral annular calcification. Trivial mitral valve regurgitation. Moderate mitral valve stenosis. MV peak gradient, 26.0 mmHg. The mean mitral valve gradient is 7.0 mmHg. Tricuspid Valve: The tricuspid valve is normal in structure. Tricuspid valve regurgitation is trivial. Aortic Valve: The aortic valve is tricuspid. Aortic valve regurgitation is mild. Aortic regurgitation PHT measures 247 msec. Severe aortic stenosis is present. Aortic valve mean gradient measures 65.0 mmHg. Aortic valve peak gradient measures 104.6 mmHg.  Aortic valve area, by VTI measures 0.71 cm. Pulmonic Valve: The pulmonic valve was normal in structure. Pulmonic valve regurgitation is trivial. Aorta: The aortic root is normal in size and structure. Venous: The inferior vena cava is dilated in size with greater than 50% respiratory variability, suggesting right atrial pressure of 8 mmHg. IAS/Shunts: No atrial level shunt detected by color flow Doppler.  LEFT VENTRICLE PLAX 2D LVIDd:         5.80 cm  Diastology LVIDs:         3.90 cm  LV e' medial:  6.25 cm/s LV PW:         1.00 cm  LV e' lateral: 5.98 cm/s LV IVS:        0.80 cm LVOT diam:     2.00 cm LV SV:         86 LV SV Index:   43 LVOT Area:     3.14 cm  RIGHT VENTRICLE RV  S prime:     8.11 cm/s TAPSE (M-mode): 2.1 cm LEFT ATRIUM             Index       RIGHT ATRIUM           Index LA diam:        5.10 cm 2.55 cm/m  RA Area:     19.10 cm LA Vol (A2C):   71.4 ml 35.72 ml/m RA Volume:   50.20 ml  25.12 ml/m  LA Vol (A4C):   74.1 ml 37.08 ml/m LA Biplane Vol: 74.3 ml 37.18 ml/m  AORTIC VALVE AV Area (Vmax):    0.71 cm AV Area (Vmean):   0.70 cm AV Area (VTI):     0.71 cm AV Vmax:           511.33 cm/s AV Vmean:          354.000 cm/s AV VTI:            1.217 m AV Peak Grad:      104.6 mmHg AV Mean Grad:      65.0 mmHg LVOT Vmax:         115.50 cm/s LVOT Vmean:        78.900 cm/s LVOT VTI:          0.274 m LVOT/AV VTI ratio: 0.23 AI PHT:            247 msec  AORTA Ao Root diam: 3.20 cm Ao Asc diam:  3.00 cm MITRAL VALVE              TRICUSPID VALVE MV Area (PHT): 2.97 cm   TR Peak grad:   81.7 mmHg MV Area VTI:   1.32 cm   TR Vmax:        452.00 cm/s MV Peak grad:  26.0 mmHg MV Mean grad:  7.0 mmHg   SHUNTS MV Vmax:       2.55 m/s   Systemic VTI:  0.27 m MV Vmean:      113.0 cm/s Systemic Diam: 2.00 cm MR Peak grad: 24.4 mmHg MR Vmax:      247.00 cm/s Loralie Champagne MD Electronically signed by Loralie Champagne MD Signature Date/Time: 10/09/2020/4:36:06 PM    Final    VAS [place] UPPER EXTREMITY VEIN MAPPING  Result Date: 09/18/2020 UPPER EXTREMITY VEIN MAPPING  Indications: Pre-access. History: HTN, DM.  Performing Technologist: Alvia Grove RVT  Examination Guidelines: A complete evaluation includes B-mode imaging, spectral Doppler, color Doppler, and power Doppler as needed of all accessible portions of each vessel. Bilateral testing is considered an integral part of a complete examination. Limited examinations for reoccurring indications may be performed as noted. +-----------------+-------------+----------+---------+ Right Cephalic   Diameter (cm)Depth (cm)Findings  +-----------------+-------------+----------+---------+ Shoulder             0.35                          +-----------------+-------------+----------+---------+ Prox upper arm       0.43                         +-----------------+-------------+----------+---------+ Mid upper arm     0.56 / 0.49                     +-----------------+-------------+----------+---------+ Dist upper arm       0.45               branching +-----------------+-------------+----------+---------+ Antecubital fossa    0.64                         +-----------------+-------------+----------+---------+ Prox forearm      0.41 / 0.60             joins   +-----------------+-------------+----------+---------+ Mid forearm          0.35  branching +-----------------+-------------+----------+---------+ Dist forearm         0.29                         +-----------------+-------------+----------+---------+ +-----------------+-------------+----------+-----------+ Right Basilic    Diameter (cm)Depth (cm) Findings   +-----------------+-------------+----------+-----------+ Prox upper arm       0.43               prox to mid +-----------------+-------------+----------+-----------+ Mid upper arm        0.38                           +-----------------+-------------+----------+-----------+ Dist upper arm       0.31                           +-----------------+-------------+----------+-----------+ Antecubital fossa    0.39                           +-----------------+-------------+----------+-----------+ Prox forearm         0.38                           +-----------------+-------------+----------+-----------+ +-----------------+-------------+----------+--------+ Left Cephalic    Diameter (cm)Depth (cm)Findings +-----------------+-------------+----------+--------+ Shoulder             0.21                        +-----------------+-------------+----------+--------+ Prox upper arm       0.27                        +-----------------+-------------+----------+--------+ Mid  upper arm     0.31 / 0.35                    +-----------------+-------------+----------+--------+ Dist upper arm    0.45 / 0.27            valves  +-----------------+-------------+----------+--------+ Antecubital fossa 0.36 / 0.49                    +-----------------+-------------+----------+--------+ Prox forearm         0.32                        +-----------------+-------------+----------+--------+ Mid forearm          0.27                        +-----------------+-------------+----------+--------+ Dist forearm         0.20                        +-----------------+-------------+----------+--------+ +-----------------+-------------+----------+---------+ Left Basilic     Diameter (cm)Depth (cm)Findings  +-----------------+-------------+----------+---------+ Prox upper arm    0.36 / 0.49           branching +-----------------+-------------+----------+---------+ Mid upper arm     0.35 / 0.35                     +-----------------+-------------+----------+---------+ Dist upper arm       0.32                         +-----------------+-------------+----------+---------+ Antecubital fossa    0.23  branching +-----------------+-------------+----------+---------+ Prox forearm      0.21 / 0.19                     +-----------------+-------------+----------+---------+ Summary:   Measurements above. *See table(s) above for measurements and observations.  Diagnosing physician: Servando Snare MD Electronically signed by Servando Snare MD on 09/18/2020 at 9:27:58 AM.    Final     Labs:  CBC: Recent Labs    09/05/20 1253 09/21/20 0949 09/26/20 0641 09/28/20 1351 10/09/20 1540  WBC 5.9 4.9  --  4.5 6.7  HGB 8.0* 6.0* 7.5* 6.0* 4.6*  HCT 24.3* 18.0* 22.0* 17.7* 14.2*  PLT 187 197  --  145* 196    COAGS: No results for input(s): INR, APTT in the last 8760 hours.  BMP: Recent Labs    04/07/20 1114 04/28/20 1127 05/11/20 1232  05/18/20 0825 06/21/20 1459 06/29/20 1030 09/05/20 1253 09/21/20 0949 09/26/20 0641 09/28/20 1351 10/09/20 0554  NA 136   < > 134   < > 132*   < > 139 137 136 132* 132*  K 4.5   < > 4.9   < > 5.4*   < > 4.4 4.0 3.3* 4.6 3.1*  CL 105   < > 100   < > 99   < > 101 103 97* 97* 98  CO2 25   < > 18*   < > 16*   < > 21 25  --  26 20*  GLUCOSE 99   < > 112*   < > 182*   < > 191* 146* 113* 162* 208*  BUN 75*   < > 65*   < > 100*   < > 89* 91* 127* 112* 123*  CALCIUM 9.2   < > 9.1   < > 9.0   < > 9.1 9.4  --  9.0 9.0  CREATININE 2.78*   < > 2.91*   < > 4.30*   < > 3.37* 3.31* 3.80* 4.79* 4.05*  GFRNONAA 17*   < > 16*   < > 10*   < > 14* 15*  --  10* 12*  GFRAA 20*  --  19*  --  12*  --  16*  --   --   --   --    < > = values in this interval not displayed.    LIVER FUNCTION TESTS: Recent Labs    09/01/20 0023 09/21/20 0949 09/28/20 1351 10/09/20 0554  BILITOT 1.4* 0.8 0.9 1.0  AST 57* 47* 104* 113*  ALT 34 34 82* 75*  ALKPHOS 78 126 155* 110  PROT 6.6 7.1 6.5 6.0*  ALBUMIN 3.4* 3.6 3.4* 3.1*    TUMOR MARKERS: No results for input(s): AFPTM, CEA, CA199, CHROMGRNA in the last 8760 hours.  Assessment and Plan: AKI on CKD, now in need of dialysis Anemia, just received PRBC x 3 transfusion, Hgb up to 8.3. Chest pain, Per cardiology notes, no plans for heart cath. Discussed with Dr. Johnney Ou, plan to start HD to see how she tolerates it. Will need  tunneled HD cath Explained procedure to pt and husband, they are agreeable to proceed. Risks and benefits of image guided tunneled HD catheter placement was discussed with the patient including, but not limited to bleeding, infection, pneumothorax, or fibrin sheath development and need for additional procedures.  All of the patient's questions were answered, patient is agreeable to proceed. Consent signed and in chart.    Thank you for  this interesting consult.  I greatly enjoyed meeting  Paone and look forward to  participating in their care.  A copy of this report was sent to the requesting provider on this date.  Electronically Signed: Ascencion Dike, PA-C 10/10/2020, 10:27 AM   I spent a total of 20 in face to face in clinical consultation, greater than 50% of which was counseling/coordinating care for tunneled HD cath

## 2020-10-10 NOTE — Progress Notes (Addendum)
Advanced Heart Failure Rounding Note  PCP: Darleen Crocker Primary Cardiologist:   Subjective:    Chest pain has resolved.   Rec'd 3 u PRBC's overnight with increased oxygen requirement, given IV lasix.  No epistaxis last night that she knows of but stool still dark. She denies shortness of breath at rest.     Objective:   Weight Range: 95.4 kg Body mass index is 36.1 kg/m.   Vital Signs:   Temp:  [98 F (36.7 C)-98.5 F (36.9 C)] 98.1 F (36.7 C) (03/29 0720) Pulse Rate:  [62-103] 62 (03/29 0720) Resp:  [13-23] 18 (03/29 0720) BP: (113-146)/(31-97) 143/87 (03/29 0720) SpO2:  [80 %-100 %] 99 % (03/29 0720)    Weight change: Filed Weights   10/09/20 0353  Weight: 95.4 kg    Intake/Output:   Intake/Output Summary (Last 24 hours) at 10/10/2020 0938 Last data filed at 10/09/2020 2325 Gross per 24 hour  Intake 315 ml  Output --  Net 315 ml      Physical Exam    Cardiac: JVP to jaw, normal rate and rhythm, clear s1 and s2, 3/6 AS murmur high pitched with diminished S2, no rubs or gallops, bilateral LE edema non pitting Pulmonary: decreased bases with bilateral rales, not in distress Abdominal: non distended abdomen, soft and nontender Nares: no blood present, signs of dead skin on septum bilaterally Psych: Alert, conversant, in good spirits    Telemetry   Atrial rhythm changing maybe ectopic vs sinus with some av block   EKG    Likely a junctional rhythm with occasional sinus vs ectopic atrial beats.  There is resolution of ST depressions which were present on admission ecg  Labs    CBC Recent Labs    10/09/20 1540  WBC 6.7  NEUTROABS 5.5  HGB 4.6*  HCT 14.2*  MCV 97.9  PLT 182   Basic Metabolic Panel Recent Labs    10/09/20 0554  NA 132*  K 3.1*  CL 98  CO2 20*  GLUCOSE 208*  BUN 123*  CREATININE 4.05*  CALCIUM 9.0   Liver Function Tests Recent Labs    10/09/20 0554  AST 113*  ALT 75*  ALKPHOS 110  BILITOT 1.0  PROT 6.0*   ALBUMIN 3.1*   Recent Labs    10/09/20 0554  LIPASE 32   Cardiac Enzymes No results for input(s): CKTOTAL, CKMB, CKMBINDEX, TROPONINI in the last 72 hours.  BNP: BNP (last 3 results) Recent Labs    06/07/20 0826 07/06/20 1733  BNP 489.2* 932.6*    ProBNP (last 3 results) No results for input(s): PROBNP in the last 8760 hours.   D-Dimer No results for input(s): DDIMER in the last 72 hours. Hemoglobin A1C No results for input(s): HGBA1C in the last 72 hours. Fasting Lipid Panel No results for input(s): CHOL, HDL, LDLCALC, TRIG, CHOLHDL, LDLDIRECT in the last 72 hours. Thyroid Function Tests No results for input(s): TSH, T4TOTAL, T3FREE, THYROIDAB in the last 72 hours.  Invalid input(s): FREET3  Other results:   Imaging    ECHOCARDIOGRAM COMPLETE  Result Date: 10/09/2020    ECHOCARDIOGRAM REPORT   Patient Name:   Patricia Mata Date of Exam: 10/09/2020 Medical Rec #:  [id number]            Height:       64.0 in Accession #:    [id number]           Weight:       210.3 lb  Date of Birth:  [birth year]             BSA:          1.999 m Patient Age:    65 years             BP:           132/51 mmHg Patient Gender: F                    HR:           72 bpm. Exam Location:  Inpatient Procedure: 2D Echo, Cardiac Doppler and Color Doppler Indications:    Pericardial effusion I31.3  History:        Patient has prior history of Echocardiogram examinations, most                 recent 06/07/2020. CHF, Pulmonary HTN, AS, Arrythmias:Atrial                 Fibrillation; Risk Factors:Hypertension and Diabetes.  Sonographer:    Bernadene Person RDCS Referring Phys: [id number] [place]  1. Left ventricular ejection fraction, by estimation, is 50 to 55%. The left ventricle has low normal function. The left ventricle has no regional wall motion abnormalities. The left ventricular internal cavity size was mildly dilated. Left ventricular diastolic parameters are indeterminate.  2.  Right ventricular systolic function is mildly reduced. The right ventricular size is normal. There is severely elevated pulmonary artery systolic pressure. The estimated right ventricular systolic pressure is 36.6 mmHg.  3. Left atrial size was mildly dilated.  4. Right atrial size was mildly dilated.  5. The mitral valve is degenerative. Trivial mitral valve regurgitation. Moderate mitral stenosis. The mean mitral valve gradient is 7.0 mmHg with MVA 1.5 cm^2 by VTI. Moderate to severe mitral annular calcification.  6. The aortic valve is tricuspid. Aortic valve regurgitation is mild. Severe aortic valve stenosis. Aortic valve area, by VTI measures 0.71 cm. Aortic valve mean gradient measures 65.0 mmHg.  7. The inferior vena cava is dilated in size with >50% respiratory variability, suggesting right atrial pressure of 8 mmHg.  8. The patient was in atrial fibrillation. FINDINGS  Left Ventricle: Left ventricular ejection fraction, by estimation, is 50 to 55%. The left ventricle has low normal function. The left ventricle has no regional wall motion abnormalities. The left ventricular internal cavity size was mildly dilated. There is no left ventricular hypertrophy. Left ventricular diastolic parameters are indeterminate. Right Ventricle: The right ventricular size is normal. No increase in right ventricular wall thickness. Right ventricular systolic function is mildly reduced. There is severely elevated pulmonary artery systolic pressure. The tricuspid regurgitant velocity is 4.52 m/s, and with an assumed right atrial pressure of 8 mmHg, the estimated right ventricular systolic pressure is 29.4 mmHg. Left Atrium: Left atrial size was mildly dilated. Right Atrium: Right atrial size was mildly dilated. Pericardium: Trivial pericardial effusion is present. Mitral Valve: The mitral valve is degenerative in appearance. There is moderate calcification of the mitral valve leaflet(s). Moderate to severe mitral annular  calcification. Trivial mitral valve regurgitation. Moderate mitral valve stenosis. MV peak gradient, 26.0 mmHg. The mean mitral valve gradient is 7.0 mmHg. Tricuspid Valve: The tricuspid valve is normal in structure. Tricuspid valve regurgitation is trivial. Aortic Valve: The aortic valve is tricuspid. Aortic valve regurgitation is mild. Aortic regurgitation PHT measures 247 msec. Severe aortic stenosis is present. Aortic valve mean gradient measures 65.0 mmHg. Aortic valve peak gradient  measures 104.6 mmHg.  Aortic valve area, by VTI measures 0.71 cm. Pulmonic Valve: The pulmonic valve was normal in structure. Pulmonic valve regurgitation is trivial. Aorta: The aortic root is normal in size and structure. Venous: The inferior vena cava is dilated in size with greater than 50% respiratory variability, suggesting right atrial pressure of 8 mmHg. IAS/Shunts: No atrial level shunt detected by color flow Doppler.  LEFT VENTRICLE PLAX 2D LVIDd:         5.80 cm  Diastology LVIDs:         3.90 cm  LV e' medial:  6.25 cm/s LV PW:         1.00 cm  LV e' lateral: 5.98 cm/s LV IVS:        0.80 cm LVOT diam:     2.00 cm LV SV:         86 LV SV Index:   43 LVOT Area:     3.14 cm  RIGHT VENTRICLE RV S prime:     8.11 cm/s TAPSE (M-mode): 2.1 cm LEFT ATRIUM             Index       RIGHT ATRIUM           Index LA diam:        5.10 cm 2.55 cm/m  RA Area:     19.10 cm LA Vol (A2C):   71.4 ml 35.72 ml/m RA Volume:   50.20 ml  25.12 ml/m LA Vol (A4C):   74.1 ml 37.08 ml/m LA Biplane Vol: 74.3 ml 37.18 ml/m  AORTIC VALVE AV Area (Vmax):    0.71 cm AV Area (Vmean):   0.70 cm AV Area (VTI):     0.71 cm AV Vmax:           511.33 cm/s AV Vmean:          354.000 cm/s AV VTI:            1.217 m AV Peak Grad:      104.6 mmHg AV Mean Grad:      65.0 mmHg LVOT Vmax:         115.50 cm/s LVOT Vmean:        78.900 cm/s LVOT VTI:          0.274 m LVOT/AV VTI ratio: 0.23 AI PHT:            247 msec  AORTA Ao Root diam: 3.20 cm Ao Asc diam:   3.00 cm MITRAL VALVE              TRICUSPID VALVE MV Area (PHT): 2.97 cm   TR Peak grad:   81.7 mmHg MV Area VTI:   1.32 cm   TR Vmax:        452.00 cm/s MV Peak grad:  26.0 mmHg MV Mean grad:  7.0 mmHg   SHUNTS MV Vmax:       2.55 m/s   Systemic VTI:  0.27 m MV Vmean:      113.0 cm/s Systemic Diam: 2.00 cm MR Peak grad: 24.4 mmHg MR Vmax:      247.00 cm/s Loralie Champagne MD Electronically signed by Loralie Champagne MD Signature Date/Time: 10/09/2020/4:36:06 PM    Final       Medications:     Scheduled Medications: . allopurinol  300 mg Oral Daily  . amiodarone  200 mg Oral Daily  . aspirin  324 mg Oral NOW   Or  . aspirin  300 mg Rectal NOW  . atorvastatin  40 mg Oral Daily  . carvedilol  6.25 mg Oral BID  . cloNIDine  0.1 mg Oral BID  . fluticasone  1 spray Each Nare Daily  . furosemide  40 mg Oral Daily  . hydrALAZINE  100 mg Oral TID  . insulin aspart  0-9 Units Subcutaneous TID WC  . insulin glargine  20 Units Subcutaneous Daily  . latanoprost  1 drop Both Eyes QHS  . levothyroxine  150 mcg Oral QAC breakfast  . linagliptin  5 mg Oral Daily  . loratadine  10 mg Oral QODAY  .  morphine injection  4 mg Intravenous Once  . pantoprazole  40 mg Oral Daily  . sildenafil  20 mg Oral TID  . sodium bicarbonate  650 mg Oral BID     Infusions:   PRN Medications:  acetaminophen, diphenhydrAMINE, HYDROmorphone (DILAUDID) injection, nitroGLYCERIN, nitroGLYCERIN, ondansetron (ZOFRAN) IV, oxyCODONE-acetaminophen, sodium chloride    Patient Profile   Patricia Mata is a 65 y.o. 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 Karle Starch.  Assessment/Plan   Chest pain with elevated troponin and ST depression on EKG -elevated troponin highest value 7,352 last night may not have peaked, ecg with global ischemia in the setting of markedly low hgb 4.9.  No CAD by [facility] 2019 however  CAD by HRCT scan on 02/2020, no RWMA on echo this admission.  -s/p 3 u PRBC's transfusion ecg overnight with resolution of ST depression on ecg and symptoms -needs LHC timing tbd likely next 1-2 days   Severe aortic stenosis and moderate mitral stenosis - 09/2020 AVA 0.71, mean gradient 65 compared with 0.84 and 66 on 05/2020 - MS moderate mean gradient is 7, MVA 1.5 - This will make dialysis difficult, needs L/RHC in the next day or two - TAVR vs BAV discuss with valve team  PAH - suspect multifactorial in nature suspect including high output due to anemia, WHO group II (diastolic HF and valvular disease) and III (OSA) but given degree of PH cannot exclude WHO Group I component. - [facility] 2019 with severe PAH; repeat [place] 04/2019 with improved PA pressures - prior VQ negative, PFTs 8/20 with normal spirometry and moderately reduced DLCO. - sleep study still pending - on sildenafil, continue  CKD stage V - nephrology following plan to initiate dialysis during admission   Paroxysmal atrial fibrillation - maintaining NSR on home amiodarone - not on [place] due to transfusion dependent anemia  Chronic diastolic CHF - EF normal but with severe AS, MS and G2DD - Markedly volume overloaded on exam - BP okay on home meds may need to adjust with dialysis - Discuss with nephrology UF removal/diuretic therapy  MDS -Diagnosed in 2017 treated with 5-azacytidine finished 7 cycles of therapy completed in 2017.  -Currently only on a hematopoiesis stimulator luspatercept , last oncology note says any further treatments would be palliative  Chronic blood loss anemia/anemia of chronic kidney disease and MDS: -recurrent  epistaxis uses prn afrin had ENT evaluation in the past -had noticeable episodes as recent as 3-4 days ago -will need  control of this if she is to undergo aggressive management, will discuss case with ENT, monitor for further bleeding   Length of Stay: 1   Katherine Roan, MD  10/10/2020, 8:21 AM  Advanced Heart Failure Team Pager [phone] (M-F; 7a - 4p)  Please contact [facility] for night-coverage after hours (4p -7a ) and weekends on [email or web address]  Patient seen and examined with the above-signed Advanced Practice Provider and/or Housestaff. I personally reviewed laboratory data, imaging studies and relevant notes. I independently examined the patient and formulated the important aspects of the plan. I have edited the note to reflect any of my changes or salient points. I have personally discussed the plan with the patient and/or family.  CP now resolved. Last hs trop > 7k. Had 3u RBCs overnight. No SOB, orthopnea or PND.   Echo with normal LV severe AS  Renal planning for placement of tunnleled cath.   General:  Lying in bed  No resp difficulty HEENT: normal Neck: supple. JVP to jaw Carotids 2+ bilat; + bruits. No lymphadenopathy or thryomegaly appreciated. Cor: PMI nondisplaced. Regular rate & rhythm. 3/6 AS s2 depressed  Lungs: clear Abdomen: obese soft, nontender, nondistended. No hepatosplenomegaly. No bruits or masses. Good bowel sounds. Extremities: no cyanosis, clubbing, rash, tr-1+ edema Neuro: alert & orientedx3, cranial nerves grossly intact. moves all 4 extremities w/o difficulty. Affect pleasant   Very difficult situation. Likely NSTEMI from demand ischemia in setting of profound anemia and severe AS.  Renal plans for tunneled cath noted. Not sure she will tolerate iHD with severe AS. TAVR not an option with tunneled cath due to risk of valve infection.   Best plan may be to consider AoV balloon valvuloplasty (BAV) as temporizing measure so she can tolerate iHD  until AVF matures. Once AVF matures can pull tunneled cath and plan TAVR.   Will need multidisciplinary discussions.   Once on HD will need R/L cath to evaluate coronaries as well.   Epistaxis resolved currently. Has previously seen Dr. Wilburn Cornelia.   Glori Bickers, MD  3:15 PM

## 2020-10-10 NOTE — Progress Notes (Signed)
Cross-coverage note:   Patient with CKD V and severe AS developed new supplemental O2 requirement while being transfused RBCs. Saturation upper 80s on rm air. Patient did not have any complaints, not in distress. Plan to give IV Lasix, resume transfusion at slower rate, continue close monitoring.

## 2020-10-10 NOTE — ED Notes (Signed)
PT/INR recollected and sent to lab.

## 2020-10-11 ENCOUNTER — Inpatient Hospital Stay (HOSPITAL_COMMUNITY): Payer: Medicare Other

## 2020-10-11 DIAGNOSIS — I214 Non-ST elevation (NSTEMI) myocardial infarction: Secondary | ICD-10-CM | POA: Diagnosis not present

## 2020-10-11 HISTORY — PX: IR US GUIDE VASC ACCESS RIGHT: IMG2390

## 2020-10-11 HISTORY — PX: IR FLUORO GUIDE CV LINE RIGHT: IMG2283

## 2020-10-11 LAB — BASIC METABOLIC PANEL
Anion gap: 11 (ref 5–15)
BUN: 163 mg/dL — ABNORMAL HIGH (ref 8–23)
CO2: 24 mmol/L (ref 22–32)
Calcium: 9.5 mg/dL (ref 8.9–10.3)
Chloride: 100 mmol/L (ref 98–111)
Creatinine, Ser: 5.15 mg/dL — ABNORMAL HIGH (ref 0.44–1.00)
GFR, Estimated: 9 mL/min — ABNORMAL LOW (ref >60)
Glucose, Bld: 161 mg/dL — ABNORMAL HIGH (ref 70–99)
Potassium: 4.2 mmol/L (ref 3.5–5.1)
Sodium: 135 mmol/L (ref 135–145)

## 2020-10-11 LAB — BPAM RBC
Blood Product Expiration Date: 202204112359
Blood Product Expiration Date: 202204112359
Blood Product Expiration Date: 202204242359
ISSUE DATE / TIME: 202203282057
ISSUE DATE / TIME: 202203290056
ISSUE DATE / TIME: 202203290439
Unit Type and Rh: 600
Unit Type and Rh: 600
Unit Type and Rh: 600

## 2020-10-11 LAB — CBC
HCT: 20.9 % — ABNORMAL LOW (ref 36.0–46.0)
Hemoglobin: 7.3 g/dL — ABNORMAL LOW (ref 12.0–15.0)
MCH: 31.3 pg (ref 26.0–34.0)
MCHC: 34.9 g/dL (ref 30.0–36.0)
MCV: 89.7 fL (ref 80.0–100.0)
Platelets: 191 10*3/uL (ref 150–400)
RBC: 2.33 MIL/uL — ABNORMAL LOW (ref 3.87–5.11)
RDW: 15.9 % — ABNORMAL HIGH (ref 11.5–15.5)
WBC: 8.8 10*3/uL (ref 4.0–10.5)
nRBC: 3.2 % — ABNORMAL HIGH (ref 0.0–0.2)

## 2020-10-11 LAB — PATHOLOGIST SMEAR REVIEW

## 2020-10-11 LAB — TYPE AND SCREEN
ABO/RH(D): A NEG
Antibody Screen: NEGATIVE
Unit division: 0
Unit division: 0
Unit division: 0

## 2020-10-11 LAB — GLUCOSE, CAPILLARY
Glucose-Capillary: 152 mg/dL — ABNORMAL HIGH (ref 70–99)
Glucose-Capillary: 154 mg/dL — ABNORMAL HIGH (ref 70–99)
Glucose-Capillary: 98 mg/dL (ref 70–99)
Glucose-Capillary: 194 mg/dL — ABNORMAL HIGH (ref 70–99)

## 2020-10-11 LAB — HEPATITIS B SURFACE ANTIBODY,QUALITATIVE: Hep B S Ab: NONREACTIVE

## 2020-10-11 LAB — HEPATITIS B CORE ANTIBODY, TOTAL: Hep B Core Total Ab: NONREACTIVE

## 2020-10-11 LAB — HEPATITIS B SURFACE ANTIGEN: Hepatitis B Surface Ag: NONREACTIVE

## 2020-10-11 MED ORDER — VANCOMYCIN HCL 1000 MG/200ML IV SOLN
1000.0000 mg | Freq: Once | INTRAVENOUS | Status: AC
  Filled 2020-10-11: qty 200

## 2020-10-11 MED ORDER — LIDOCAINE-EPINEPHRINE 1 %-1:100000 IJ SOLN
INTRAMUSCULAR | Status: AC | PRN
  Administered 2020-10-11: 10 mL

## 2020-10-11 MED ORDER — VANCOMYCIN HCL IN DEXTROSE 1-5 GM/200ML-% IV SOLN
INTRAVENOUS | Status: AC
  Administered 2020-10-11: 1000 mg
  Filled 2020-10-11: qty 200

## 2020-10-11 MED ORDER — MIDAZOLAM HCL 2 MG/2ML IJ SOLN
INTRAMUSCULAR | Status: AC
  Filled 2020-10-11: qty 2

## 2020-10-11 MED ORDER — SODIUM CHLORIDE 0.9 % IV SOLN
100.0000 mL | INTRAVENOUS | Status: DC | PRN
Start: 2020-10-11 — End: 2020-10-11

## 2020-10-11 MED ORDER — FENTANYL CITRATE (PF) 100 MCG/2ML IJ SOLN
INTRAMUSCULAR | Status: AC | PRN
  Administered 2020-10-11: 25 ug via INTRAVENOUS

## 2020-10-11 MED ORDER — LIDOCAINE-PRILOCAINE 2.5-2.5 % EX CREA: 1.0000 "application " | TOPICAL_CREAM | CUTANEOUS | Status: DC | PRN

## 2020-10-11 MED ORDER — ASPIRIN 81 MG PO CHEW
81.0000 mg | CHEWABLE_TABLET | ORAL | Status: AC
  Administered 2020-10-13: 81 mg via ORAL
  Filled 2020-10-11: qty 1

## 2020-10-11 MED ORDER — LIDOCAINE-EPINEPHRINE 1 %-1:100000 IJ SOLN
INTRAMUSCULAR | Status: AC
  Filled 2020-10-11: qty 1

## 2020-10-11 MED ORDER — SODIUM CHLORIDE 0.9 % IV SOLN: INTRAVENOUS | Status: DC

## 2020-10-11 MED ORDER — SODIUM CHLORIDE 0.9% FLUSH: 3.0000 mL | INTRAVENOUS | Status: DC | PRN

## 2020-10-11 MED ORDER — SODIUM CHLORIDE 0.9% FLUSH
3.0000 mL | Freq: Two times a day (BID) | INTRAVENOUS | Status: DC
Start: 2020-10-11 — End: 2020-10-16
  Administered 2020-10-11 – 2020-10-16 (×10): 3 mL via INTRAVENOUS

## 2020-10-11 MED ORDER — PENTAFLUOROPROP-TETRAFLUOROETH EX AERO: 1.0000 "application " | INHALATION_SPRAY | CUTANEOUS | Status: DC | PRN

## 2020-10-11 MED ORDER — FENTANYL CITRATE (PF) 100 MCG/2ML IJ SOLN
INTRAMUSCULAR | Status: AC
  Filled 2020-10-11: qty 2

## 2020-10-11 MED ORDER — HEPARIN SODIUM (PORCINE) 1000 UNIT/ML IJ SOLN
INTRAMUSCULAR | Status: AC
  Filled 2020-10-11: qty 3

## 2020-10-11 MED ORDER — SODIUM CHLORIDE 0.9 % IV SOLN: 100.0000 mL | INTRAVENOUS | Status: DC | PRN

## 2020-10-11 MED ORDER — SODIUM CHLORIDE 0.9 % IV SOLN: 250.0000 mL | INTRAVENOUS | Status: DC | PRN

## 2020-10-11 MED ORDER — HEPARIN SODIUM (PORCINE) 1000 UNIT/ML DIALYSIS: 1000.0000 [IU] | INTRAMUSCULAR | Status: DC | PRN

## 2020-10-11 MED ORDER — LIDOCAINE HCL (PF) 1 % IJ SOLN: 5.0000 mL | INTRAMUSCULAR | Status: DC | PRN

## 2020-10-11 MED ORDER — HEPARIN SODIUM (PORCINE) 1000 UNIT/ML IJ SOLN
INTRAMUSCULAR | Status: AC
  Filled 2020-10-11: qty 1

## 2020-10-11 MED ORDER — ALTEPLASE 2 MG IJ SOLR: 2.0000 mg | Freq: Once | INTRAMUSCULAR | Status: DC | PRN

## 2020-10-11 MED ORDER — MIDAZOLAM HCL 2 MG/2ML IJ SOLN
INTRAMUSCULAR | Status: AC | PRN
Start: 2020-10-11 — End: 2020-10-11
  Administered 2020-10-11: 1 mg via INTRAVENOUS

## 2020-10-11 NOTE — Consult Note (Addendum)
HE[facility] VALVE TEAM  Cardiology Consultation:   Patient ID: Patricia Mata MRN: [id number]; DOB: January 06, 1956  Admit date: 10/09/2020 Date of Consult: 10/11/2020  Primary Care Provider: [name], [facility] Cardiologist: Quay Burow, MD / Dr. [facility] Electrophysiologist:  None    Patient Profile:   Patricia Mata is a 65 y.o. 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 Haroldine Laws.   History of Present Illness:   Patricia Mata lives at home with her husband. She is still able to take care of her own ADLs but has a lot of help from husband. Can walk around the house without the aid of a walker or cane but does not do much outside of that.   In 2008 she was diagnosed with stage III colon cancer and underwent hemicolectomy and chemotherapy.  She ultimately developed myelodysplastic syndrome as a result of chemotherapy and gone on to develop transfusion dependent anemia and is followed closely by hematology.   She was initially referred to cardiology for evaluation of bilateral LE edema in 2018 and seen by Dr. Gwenlyn Found. Transthoracic echo was ordered which revealed a hyperdynamic LVEF, intracavitary gradient of 22 mmHg that increased to 30 mmHg with Valsalva, moderate concentric LVH, severe pulmonary hypertension with a PA pressure of 82 mmHg and severe aortic  stenosis with a mean gradient of 37 mmHg, peak gradient of 70 mmHg and AVA of 0.81.  She was admitted in 04/2018 with A. fib with RVR and recurrent anemia.  She developed respiratory failure and required intubation.  She was transfused and placed on IV amiodarone.  She subsequently developed AKI/ESRD and was placed on CVVHD.  She was eventually weaned off the vent and converted to sinus rhythm on amiodarone. Her renal function eventually recovered and she was able to be taken off of CVVHD.  Anticoagulation was not initiated given severe, chronic anemia. Follow-up cardiac catheterization by Dr. Saunders Revel on 05/21/2018 showed no significant CAD, severe pulmonary hypertension with a mean PAP of 62 mmHg, mildly elevated filling pressures, moderate aortic stenosis and moderate mitral stenosis.  Dr. Saunders Revel did not feel that her valvular disease was the primary cause of the patient's symptoms and recommended advanced heart failure consultation to assist with management of HFpEF and severe pulmonary hypertension. She was subsequently referred to Dr. Haroldine Laws in the advanced heart failure clinic and has been followed carefully ever since.She was previously not felt to be a candidate for intervention of AS or MS with advanced PAH and transfusion-dependent myelodysplasia. VQ scan was negative. She was started on sildenafil for pulmonary HTN. Last RHC in 04/2019 showed mild PAH in setting of high output state, likely due to anemia. She also required wound care for coccygeal osteomyelitis in 2021. Prior peripheral duplexes included carotid disease and possibly >60% right renal stenosis as well as celiac/mesenteric PAD. Dr. Gwenlyn Found was not convinced of renovascular HTN so recommended medical management.  She has had multiple admissions since  that time including CAP 09/2019 with VDRF (CHF possibly contributing), acute on chronic CHF in 05/2020, and recurrent volume overload with AKI up to 6.45. HD suggested but patient initially refused.  The patient later changed her mind and was amendable to starting hemodialysis. Dr. Haroldine Laws has felt that she may not tolerate HD well with severe AS and referred her to Dr. Burt Knack for consideration of TAVR. She was seen in consultation by Dr. Burt Knack on 08/30/2020.  He felt it would be reasonable to pursue workup, but would require proceeding with vascular workup for AV fistula placement so that [facility] and CT studies with contrast could be performed (given risk of progression to hemodialysis). She was then readmitted the following day, 2/17-2/19/22 with worsening anemia (hg down to 4.1) and +FOBT. Patient underwent EGD and push enteroscopy and was found to have chronic gastritis but per GI that did not seem to be the source of GI bleed so patient ended up having capsule endoscopy showing abnormal proximal small bowel mucosa but otherwise no sign of bleeding.   She underwent right arm AV fistula placement on 09/26/20. She recently underwent last transfusion of 2 U PRBCs (09/29/20) at which time she reported episodic epistaxis, sometimes lasting up to 8 hours - had been using Afrin at the direction of ENT.   She then presented to [facility] on 10/09/20 for evaluation of severe resting chest pain. CXR showed cardiomegaly and vascular congestion, NA 132, K 3.1, BUN 123/Cr 4.05 (most recent range 3.3-4.79), albumin 3.1, AST 113, ALT 75,  Hg 4.6, ECG with global ST depression suggestive of ischemia hs trop  44-> 115 -> 3,951--> 7352. She was transfused with 3U PRBCs. Hg increased to 8.3 and now drifting down to 7.3. Chest pain and ECG changes have resolved since transfusion. Repeat echo 10/09/20 showed EF 50-55%, no WMAs, mild RV dysfunction, severe pulm HTN with RSVP 89.7 mm Hg, moderate MS (mean gradient 7 mm Hg, MVA 1.5cm^2), moderate-severe MAC, mild AI and progression of severe AS with a mean gradient of 65 mm hg, peak gradient of 104.6 mm hg, AVA 0.71 cm2, DVI 0.23. Creat continues to climb 3.8--> 4.05--> 4.78--> 5.15.  Nephrology has seen and suspects she has uremic platelet dysfunction leading to the nose bleeds and life threatening anemia and plans to initiate HD this admission with a temporary dialysis catheter placement. It is felt she may tolerate HD poorly given critical AS and the structural heart team is consulted for discussion of balloon valvuloplasty as a bridge to TAVR.   Today the pt is seen laying in bed. Husband at bedsides. She has had complete resolution of chest pain since transfusion. The patient admits to occasional LE edema. She denies orthopnea or PND. No dizziness or syncope. She does get dyspnea on exertion with walking longer distances. She could not walk one city block at this point in time.    Past Medical History:  Diagnosis Date  . Acute renal failure (ARF) ([place]) 06/08/2018  . Carotid artery disease ([place])   . Chronic diastolic (congestive) heart failure ([place])   . Colon cancer ([place])   . Diabetes mellitus ([place])   . Gout 09/08/2018  . Hypertension   . Hypothyroidism   . Macrocytic anemia   . MDS (myelodysplastic syndrome) ([place])   . Moderate mitral stenosis   . PAD (peripheral artery disease) ([place])   . PAF (paroxysmal atrial fibrillation) ([place])   . Pulmonary hypertension ([place])   . Renal artery stenosis ([facility])    a. >60%  R renal artery stenosis by duplex 2021, managed medically.  . Severe aortic stenosis   . Stage 4 chronic kidney disease ([place])   . Transfusion-dependent anemia     Past Surgical History:  Procedure Laterality Date  . AV FISTULA PLACEMENT Right 09/26/2020   Procedure: RIGHT ARM ARTERIOVENOUS (AV) FISTULA CREATION;  Surgeon: Waynetta Sandy, MD;  Location: [facility] OR;  Service: Vascular;  Laterality: Right;  Monitor Anesthesia Care with Regional Block to Right Arm   . COLON SURGERY    . DEBRIDMENT OF DECUBITUS ULCER N/A 06/12/2018   Procedure: DEBRIDMENT OF DECUBITUS ULCER;  Surgeon: Wallace Going, DO;  Location: [facility] ORS;  Service: Plastics;   Laterality: N/A;  . ESOPHAGOGASTRODUODENOSCOPY N/A 09/01/2020   Procedure: ESOPHAGOGASTRODUODENOSCOPY (EGD);  Surgeon: Clarene Essex, MD;  Location: [facility];  Service: Endoscopy;  Laterality: N/A;  . GIVENS CAPSULE STUDY N/A 09/01/2020   Procedure: GIVENS CAPSULE STUDY;  Surgeon: Clarene Essex, MD;  Location: [facility] ENDOSCOPY;  Service: Endoscopy;  Laterality: N/A;  . IR FLUORO GUIDE PORT INSERTION RIGHT  04/02/2017  . IR REMOVAL TUN ACCESS W/ PORT W/O FL MOD SED  03/16/2019  . IR US GUIDE VASC ACCESS RIGHT  04/02/2017  . RIGHT HEART CATH N/A 04/23/2019   Procedure: RIGHT HEART CATH;  Surgeon: Jolaine Artist, MD;  Location: [place] CV LAB;  Service: Cardiovascular;  Laterality: N/A;  . RIGHT/LEFT HEART CATH AND CORONARY ANGIOGRAPHY N/A 05/21/2018   Procedure: RIGHT/LEFT HEART CATH AND CORONARY ANGIOGRAPHY;  Surgeon: Nelva Bush, MD;  Location: [place] CV LAB;  Service: Cardiovascular;  Laterality: N/A;  . TUBAL LIGATION       Home Medications:  Prior to Admission medications   Medication Sig Start Date End Date Taking? Authorizing Provider  allopurinol (ZYLOPRIM) 300 MG tablet Take 300 mg by mouth daily. 07/05/20  Yes [provider]  amiodarone (PACERONE) 200 MG tablet Take 1 tablet (200 mg total) by mouth daily. 07/19/20  Yes Bensimhon, Shaune Pascal, MD  atorvastatin (LIPITOR) 40 MG tablet Take 1 tablet by mouth once daily Patient taking differently: Take 40 mg by mouth daily. 08/09/20  Yes [name], FNP  blood glucose meter kit and supplies Dispense based on patient and insurance preference. Use up to four times daily as directed. (FOR ICD-10 E10.9, E11.9). 11/04/18  Yes [name], FNP  Blood Glucose Monitoring Suppl [facility]) w/Device KIT Check blood sugars twice daily E11.9 11/30/19  Yes [name], FNP  carvedilol (COREG) 6.25 MG tablet Take 1 tablet (6.25 mg total) by mouth 2 (two) times daily. 10/04/20 11/03/20 Yes Bensimhon, Shaune Pascal, MD  cloNIDine  (CATAPRES) 0.1 MG tablet Take 1 tablet (0.1 mg total) by mouth 2 (two) times daily. 07/03/20  Yes Bensimhon, Shaune Pascal, MD  fluticasone (FLONASE) 50 MCG/ACT nasal spray Place 1 spray into both nostrils daily. 01/28/20  Yes Collene Gobble, MD  furosemide (LASIX) 40 MG tablet Take 1 tablet (40 mg total) by mouth daily. Take 80 mg in AM and 40 mg in PM (4PM) 07/09/20  Yes Debbe Odea, MD  hydrALAZINE (APRESOLINE) 100 MG tablet Take 1 tablet (100 mg total) by mouth 3 (three) times daily. 09/27/20  Yes Lorretta Harp, MD  Insulin Glargine [facility]) 100 UNIT/ML Inject 20 Units into the skin daily. 09/05/20  Yes [name], FNP  Insulin Pen Needle 29G X 5MM MISC Use as directed 06/16/17  Yes Bonnielee Haff, MD  Lancets (ONETOUCH DELICA PLUS [id number]) MISC check blood sugars twice  daily Dx code: E11.22 03/02/20  Yes [name], FNP  latanoprost (XALATAN) 0.005 % ophthalmic solution Place 1 drop into both eyes at bedtime.    Yes [provider]  levothyroxine (SYNTHROID) 150 MCG tablet Take 1 tablet (150 mcg total) by mouth daily before breakfast. 09/07/20  Yes [name], FNP  linagliptin (TRADJENTA) 5 MG TABS tablet Take 1 tablet (5 mg total) by mouth daily. 02/03/20  Yes [name], FNP  loratadine (CLARITIN) 10 MG tablet Take 10 mg by mouth daily.   Yes [provider]  Multiple Vitamins-Minerals (CENTRUM SILVER 50+WOMEN) TABS Take 1 tablet by mouth daily.   Yes [provider]  omeprazole (PRILOSEC) 40 MG capsule Take 1 capsule (40 mg total) by mouth 2 (two) times daily before a meal. 09/27/20 09/27/21 Yes [name], FNP  sildenafil (REVATIO) 20 MG tablet Take 1 tablet (20 mg total) by mouth 3 (three) times daily. 09/06/20  Yes Bensimhon, Shaune Pascal, MD  sodium bicarbonate 650 MG tablet Take 650 mg by mouth 2 (two) times daily. 06/27/20  Yes [provider]  sodium chloride (OCEAN) 0.65 % nasal spray Place 1 spray into the nose in the morning, at  noon, in the evening, and at bedtime.   Yes [provider]  glucose blood test strip check blood sugars twice daily Dx code E11.22 03/02/20   [name], FNP    Inpatient Medications: Scheduled Meds: . allopurinol  100 mg Oral Daily  . amiodarone  200 mg Oral Daily  . atorvastatin  40 mg Oral Daily  . carvedilol  6.25 mg Oral BID  . Chlorhexidine Gluconate Cloth  6 each Topical Q0600  . cloNIDine  0.1 mg Oral BID  . fluticasone  1 spray Each Nare Daily  . furosemide  40 mg Oral Daily  . heparin sodium (porcine)      . hydrALAZINE  100 mg Oral TID  . insulin aspart  0-9 Units Subcutaneous TID WC  . insulin glargine  20 Units Subcutaneous Daily  . latanoprost  1 drop Both Eyes QHS  . levothyroxine  150 mcg Oral QAC breakfast  . lidocaine-EPINEPHrine      . linagliptin  5 mg Oral Daily  . loratadine  10 mg Oral QODAY  . pantoprazole  40 mg Oral Daily  . sildenafil  20 mg Oral TID  . sodium bicarbonate  650 mg Oral BID  . sodium chloride flush  3 mL Intravenous Q12H   Continuous Infusions: . vancomycin     PRN Meds: acetaminophen, diphenhydrAMINE, HYDROmorphone (DILAUDID) injection, nitroGLYCERIN, ondansetron (ZOFRAN) IV, oxyCODONE-acetaminophen, sodium chloride  Allergies:    Allergies  Allergen Reactions  . Ancef [Cefazolin] Itching    Severe itching- after procedure, ancef was the antibiotic.-04/02/17 Tolerates penicillins  . Lactose Intolerance (Gi) Diarrhea  . Sulfa Antibiotics Rash and Other (See Comments)    Blisters, also    Social History:   Social History   Socioeconomic History  . Marital status: Married    Spouse name: Not on file  . Number of children: Not on file  . Years of education: Not on file  . Highest education level: Not on file  Occupational History  . Occupation: retired  Tobacco Use  . Smoking status: Former Smoker    Packs/day: 0.25    Years: 20.00    Pack years: 5.00    Types: Cigarettes    Quit date: 01/31/2001    Years  since quitting: 19.7  . Smokeless tobacco: Former  User  Vaping Use  . Vaping Use: Never used  Substance and Sexual Activity  . Alcohol use: Yes    Alcohol/week: 2.0 standard drinks    Types: 2 Shots of liquor per week    Comment: occ  . Drug use: Never  . Sexual activity: Not on file  Other Topics Concern  . Not on file  Social History Narrative  . Not on file   Social Determinants of Health   Financial Resource Strain: Medium Risk  . Difficulty of Paying Living Expenses: Somewhat hard  Food Insecurity: Not on file  Transportation Needs: Not on file  Physical Activity: Not on file  Stress: Not on file  Social Connections: Not on file  Intimate Partner Violence: Not on file    Family History:    Family History  Problem Relation Age of Onset  . Hypertension Mother   . Diabetes Mother   . Cervical cancer Mother   . Heart attack Father   . Hypertension Sister   . Hypertension Brother   . Hypertension Sister   . Hypertension Sister   . Prostate cancer Brother   . HIV/AIDS Brother      ROS:  Please see the history of present illness.  All other ROS reviewed and negative.     Physical Exam/Data:   Vitals:   10/11/20 1130 10/11/20 1155 10/11/20 1200 10/11/20 1205  BP: (!) 153/41 (!) 157/41 (!) 144/37 (!) 136/41  Pulse: 62 63 61 61  Resp: _0 Temp:      TempSrc:      SpO2: 100% 100% 100% 98%  Weight:      Height:        Intake/Output Summary (Last 24 hours) at 10/11/2020 1215 Last data filed at 10/10/2020 1802 Gross per 24 hour  Intake 100 ml  Output --  Net 100 ml   Last 3 Weights 10/11/2020 10/11/2020 10/09/2020  Weight (lbs) 207 lb 1.6 oz 207 lb 1.6 oz 210 lb 5.1 oz  Weight (kg) 93.94 kg 93.94 kg 95.4 kg     Body mass index is 35.55 kg/m.  General:  Well nourished, well developed, in no acute distress HEENT: normal Lymph: no adenopathy Neck: no JVD, bilateral carotid bruits Endocrine:  No thryomegaly Cardiac:  normal S1, S2; RRR; 3/6 high  pitched AS murmur,  Lungs: decreased breath sounds at bases.  Abd: soft, nontender, no hepatomegaly  Ext: no edema Musculoskeletal:  No deformities, BUE and BLE strength normal and equal Skin: warm and dry  Neuro:  CNs 2-12 intact, no focal abnormalities noted Psych:  Normal affect   EKG:  The EKG was personally reviewed and demonstrates:  Sinus with 1st deg AV block vs ectopic atrial rhythm  Telemetry:  Telemetry was personally reviewed and demonstrates: sinus with occasional PACs  Relevant CV Studies: Echo 10/09/20 IMPRESSIONS  1. Left ventricular ejection fraction, by estimation, is 50 to 55%. The  left ventricle has low normal function. The left ventricle has no regional  wall motion abnormalities. The left ventricular internal cavity size was  mildly dilated. Left ventricular  diastolic parameters are indeterminate.  2. Right ventricular systolic function is mildly reduced. The right  ventricular size is normal. There is severely elevated pulmonary artery  systolic pressure. The estimated right ventricular systolic pressure is  71.6 mmHg.  3. Left atrial size was mildly dilated.  4. Right atrial size was mildly dilated.  5. The mitral valve is degenerative. Trivial mitral valve regurgitation.  Moderate mitral stenosis. The mean mitral valve gradient is 7.0 mmHg with  MVA 1.5 cm^2 by VTI. Moderate to severe mitral annular calcification.  6. The aortic valve is tricuspid. Aortic valve regurgitation is mild.  Severe aortic valve stenosis. Aortic valve area, by VTI measures 0.71 cm.  Aortic valve mean gradient measures 65.0 mmHg.  7. The inferior vena cava is dilated in size with >50% respiratory  variability, suggesting right atrial pressure of 8 mmHg.  8. The patient was in atrial fibrillation.   FINDINGS  Left Ventricle: Left ventricular ejection fraction, by estimation, is 50  to 55%. The left ventricle has low normal function. The left ventricle has  no regional  wall motion abnormalities. The left ventricular internal  cavity size was mildly dilated.  There is no left ventricular hypertrophy. Left ventricular diastolic  parameters are indeterminate.   Right Ventricle: The right ventricular size is normal. No increase in  right ventricular wall thickness. Right ventricular systolic function is  mildly reduced. There is severely elevated pulmonary artery systolic  pressure. The tricuspid regurgitant  velocity is 4.52 m/s, and with an assumed right atrial pressure of 8 mmHg,  the estimated right ventricular systolic pressure is 16.1 mmHg.   Left Atrium: Left atrial size was mildly dilated.   Right Atrium: Right atrial size was mildly dilated.   Pericardium: Trivial pericardial effusion is present.   Mitral Valve: The mitral valve is degenerative in appearance. There is  moderate calcification of the mitral valve leaflet(s). Moderate to severe  mitral annular calcification. Trivial mitral valve regurgitation. Moderate  mitral valve stenosis. MV peak  gradient, 26.0 mmHg. The mean mitral valve gradient is 7.0 mmHg.   Tricuspid Valve: The tricuspid valve is normal in structure. Tricuspid  valve regurgitation is trivial.   Aortic Valve: The aortic valve is tricuspid. Aortic valve regurgitation is  mild. Aortic regurgitation PHT measures 247 msec. Severe aortic stenosis  is present. Aortic valve mean gradient measures 65.0 mmHg. Aortic valve  peak gradient measures 104.6 mmHg.  Aortic valve area, by VTI measures 0.71 cm.   Pulmonic Valve: The pulmonic valve was normal in structure. Pulmonic valve  regurgitation is trivial.   Aorta: The aortic root is normal in size and structure.   Venous: The inferior vena cava is dilated in size with greater than 50%  respiratory variability, suggesting right atrial pressure of 8 mmHg.   IAS/Shunts: No atrial level shunt detected by color flow Doppler.     LEFT VENTRICLE  PLAX 2D  LVIDd:      5.80 cm Diastology  LVIDs:     3.90 cm LV e' medial: 6.25 cm/s  LV PW:     1.00 cm LV e' lateral: 5.98 cm/s  LV IVS:    0.80 cm  LVOT diam:   2.00 cm  LV SV:     86  LV SV Index:  43  LVOT Area:   3.14 cm     RIGHT VENTRICLE  RV S prime:   8.11 cm/s  TAPSE (M-mode): 2.1 cm   LEFT ATRIUM       Index    RIGHT ATRIUM      Index  LA diam:    5.10 cm 2.55 cm/m RA Area:   19.10 cm  LA Vol (A2C):  71.4 ml 35.72 ml/m RA Volume:  50.20 ml 25.12 ml/m  LA Vol (A4C):  74.1 ml 37.08 ml/m  LA Biplane Vol: 74.3 ml 37.18 ml/m  AORTIC VALVE  AV Area (  Vmax):  0.71 cm  AV Area (Vmean):  0.70 cm  AV Area (VTI):   0.71 cm  AV Vmax:      511.33 cm/s  AV Vmean:     354.000 cm/s  AV VTI:      1.217 m  AV Peak Grad:   104.6 mmHg  AV Mean Grad:   65.0 mmHg  LVOT Vmax:     115.50 cm/s  LVOT Vmean:    78.900 cm/s  LVOT VTI:     0.274 m  LVOT/AV VTI ratio: 0.23  AI PHT:      247 msec    AORTA  Ao Root diam: 3.20 cm  Ao Asc diam: 3.00 cm   MITRAL VALVE       TRICUSPID VALVE  MV Area (PHT): 2.97 cm  TR Peak grad:  81.7 mmHg  MV Area VTI:  1.32 cm  TR Vmax:    452.00 cm/s  MV Peak grad: 26.0 mmHg  MV Mean grad: 7.0 mmHg  SHUNTS  MV Vmax:    2.55 m/s  Systemic VTI: 0.27 m  MV Vmean:   113.0 cm/s Systemic Diam: 2.00 cm  MR Peak grad: 24.4 mmHg  MR Vmax:   247.00 cm/s     High Sensitivity Troponin:   Recent Labs  Lab 10/09/20 0554 10/09/20 0845 10/09/20 1238 10/09/20 1541 10/09/20 1741  TROPONINIHS 44* 115* 3,163* 3,951* 7,352*     Chemistry Recent Labs  Lab 10/09/20 0554 10/10/20 0939 10/11/20 0357  NA 132* 134* 135  K 3.1* 5.2* 4.2  CL 98 99 100  CO2 20* 24 24  GLUCOSE 208* 160* 161*  BUN 123* 153* 163*  CREATININE 4.05* 4.78* 5.15*  CALCIUM 9.0 9.5 9.5  GFRNONAA 12* 10* 9*  ANIONGAP _0 Recent Labs  Lab 10/09/20 0554   PROT 6.0*  ALBUMIN 3.1*  AST 113*  ALT 75*  ALKPHOS 110  BILITOT 1.0   Hematology Recent Labs  Lab 10/09/20 1540 10/09/20 1656 10/10/20 0939 10/11/20 0357  WBC 6.7  --  8.0 8.8  RBC 1.45* 1.49* 2.64* 2.33*  HGB 4.6*  --  8.3* 7.3*  HCT 14.2*  --  23.8* 20.9*  MCV 97.9  --  90.2 89.7  MCH 31.7  --  31.4 31.3  MCHC 32.4  --  34.9 34.9  RDW 18.5*  --  15.4 15.9*  PLT 196  --  178 191   BNPNo results for input(s): BNP, PROBNP in the last 168 hours.  DDimer No results for input(s): DDIMER in the last 168 hours.   Radiology/Studies:  DG Chest Port 1 View  Result Date: 10/09/2020 CLINICAL DATA:  Chest pain EXAM: PORTABLE CHEST 1 VIEW COMPARISON:  07/06/2020 FINDINGS: Cardiomegaly and hilar vascular enlargement which is stable. No Kerley lines, effusion, or pneumothorax. No air bronchogram. Artifact from EKG leads. IMPRESSION: Cardiomegaly and vascular congestion. Electronically Signed   By: Monte Fantasia M.D.   On: 10/09/2020 04:12   ECHOCARDIOGRAM COMPLETE  Result Date: 10/09/2020    ECHOCARDIOGRAM REPORT   Patient Name:   Patricia Mata Date of Exam: 10/09/2020 Medical Rec #:  [id number]            Height:       64.0 in Accession #:    [id number]           Weight:       210.3 lb Date of Birth:  1956-03-30  BSA:          1.999 m Patient Age:    4 years             BP:           132/51 mmHg Patient Gender: F                    HR:           72 bpm. Exam Location:  Inpatient Procedure: 2D Echo, Cardiac Doppler and Color Doppler Indications:    Pericardial effusion I31.3  History:        Patient has prior history of Echocardiogram examinations, most                 recent 06/07/2020. CHF, Pulmonary HTN, AS, Arrythmias:Atrial                 Fibrillation; Risk Factors:Hypertension and Diabetes.  Sonographer:    Bernadene Person RDCS Referring Phys: [id number] [place]  1. Left ventricular ejection fraction, by estimation, is 50 to 55%. The left ventricle has low  normal function. The left ventricle has no regional wall motion abnormalities. The left ventricular internal cavity size was mildly dilated. Left ventricular diastolic parameters are indeterminate.  2. Right ventricular systolic function is mildly reduced. The right ventricular size is normal. There is severely elevated pulmonary artery systolic pressure. The estimated right ventricular systolic pressure is 38.1 mmHg.  3. Left atrial size was mildly dilated.  4. Right atrial size was mildly dilated.  5. The mitral valve is degenerative. Trivial mitral valve regurgitation. Moderate mitral stenosis. The mean mitral valve gradient is 7.0 mmHg with MVA 1.5 cm^2 by VTI. Moderate to severe mitral annular calcification.  6. The aortic valve is tricuspid. Aortic valve regurgitation is mild. Severe aortic valve stenosis. Aortic valve area, by VTI measures 0.71 cm. Aortic valve mean gradient measures 65.0 mmHg.  7. The inferior vena cava is dilated in size with >50% respiratory variability, suggesting right atrial pressure of 8 mmHg.  8. The patient was in atrial fibrillation. FINDINGS  Left Ventricle: Left ventricular ejection fraction, by estimation, is 50 to 55%. The left ventricle has low normal function. The left ventricle has no regional wall motion abnormalities. The left ventricular internal cavity size was mildly dilated. There is no left ventricular hypertrophy. Left ventricular diastolic parameters are indeterminate. Right Ventricle: The right ventricular size is normal. No increase in right ventricular wall thickness. Right ventricular systolic function is mildly reduced. There is severely elevated pulmonary artery systolic pressure. The tricuspid regurgitant velocity is 4.52 m/s, and with an assumed right atrial pressure of 8 mmHg, the estimated right ventricular systolic pressure is 01.7 mmHg. Left Atrium: Left atrial size was mildly dilated. Right Atrium: Right atrial size was mildly dilated. Pericardium:  Trivial pericardial effusion is present. Mitral Valve: The mitral valve is degenerative in appearance. There is moderate calcification of the mitral valve leaflet(s). Moderate to severe mitral annular calcification. Trivial mitral valve regurgitation. Moderate mitral valve stenosis. MV peak gradient, 26.0 mmHg. The mean mitral valve gradient is 7.0 mmHg. Tricuspid Valve: The tricuspid valve is normal in structure. Tricuspid valve regurgitation is trivial. Aortic Valve: The aortic valve is tricuspid. Aortic valve regurgitation is mild. Aortic regurgitation PHT measures 247 msec. Severe aortic stenosis is present. Aortic valve mean gradient measures 65.0 mmHg. Aortic valve peak gradient measures 104.6 mmHg.  Aortic valve area, by VTI measures 0.71 cm. Pulmonic Valve: The pulmonic valve  was normal in structure. Pulmonic valve regurgitation is trivial. Aorta: The aortic root is normal in size and structure. Venous: The inferior vena cava is dilated in size with greater than 50% respiratory variability, suggesting right atrial pressure of 8 mmHg. IAS/Shunts: No atrial level shunt detected by color flow Doppler.  LEFT VENTRICLE PLAX 2D LVIDd:         5.80 cm  Diastology LVIDs:         3.90 cm  LV e' medial:  6.25 cm/s LV PW:         1.00 cm  LV e' lateral: 5.98 cm/s LV IVS:        0.80 cm LVOT diam:     2.00 cm LV SV:         86 LV SV Index:   43 LVOT Area:     3.14 cm  RIGHT VENTRICLE RV S prime:     8.11 cm/s TAPSE (M-mode): 2.1 cm LEFT ATRIUM             Index       RIGHT ATRIUM           Index LA diam:        5.10 cm 2.55 cm/m  RA Area:     19.10 cm LA Vol (A2C):   71.4 ml 35.72 ml/m RA Volume:   50.20 ml  25.12 ml/m LA Vol (A4C):   74.1 ml 37.08 ml/m LA Biplane Vol: 74.3 ml 37.18 ml/m  AORTIC VALVE AV Area (Vmax):    0.71 cm AV Area (Vmean):   0.70 cm AV Area (VTI):     0.71 cm AV Vmax:           511.33 cm/s AV Vmean:          354.000 cm/s AV VTI:            1.217 m AV Peak Grad:      104.6 mmHg AV Mean  Grad:      65.0 mmHg LVOT Vmax:         115.50 cm/s LVOT Vmean:        78.900 cm/s LVOT VTI:          0.274 m LVOT/AV VTI ratio: 0.23 AI PHT:            247 msec  AORTA Ao Root diam: 3.20 cm Ao Asc diam:  3.00 cm MITRAL VALVE              TRICUSPID VALVE MV Area (PHT): 2.97 cm   TR Peak grad:   81.7 mmHg MV Area VTI:   1.32 cm   TR Vmax:        452.00 cm/s MV Peak grad:  26.0 mmHg MV Mean grad:  7.0 mmHg   SHUNTS MV Vmax:       2.55 m/s   Systemic VTI:  0.27 m MV Vmean:      113.0 cm/s Systemic Diam: 2.00 cm MR Peak grad: 24.4 mmHg MR Vmax:      247.00 cm/s Loralie Champagne MD Electronically signed by Loralie Champagne MD Signature Date/Time: 10/09/2020/4:36:06 PM    Final        STS RISK CALCULATOR: ISOLATED AV REPLACEMENT Isolated AVR: Risk of Mortality: 3.990% Renal Failure: NA Permanent Stroke: 2.198% Prolonged Ventilation: 27.407% DSW Infection: 0.243% Reoperation: 4.495% Morbidity or Mortality: 71.252% Short Length of Stay: 11.127% Long Length of Stay: 24.372%   _____________________  [facility] Cardiomyopathy Questionnaire  KCCQ-12 10/11/2020  1  a. Ability to shower/bathe Not at all limited  1 b. Ability to walk 1 block Extremely limited  1 c. Ability to hurry/jog Other, Did not do  2. Edema feet/ankles/legs 1-2 times a week  3. Limited by fatigue All of the time  4. Limited by dyspnea At least once a day  5. Sitting up / on 3+ pillows Never over the past 2 weeks  6. Limited enjoyment of life Moderately limited  7. Rest of life w/ symptoms Somewhat satisfied  8 a. Participation in hobbies Moderately limited  8 b. Participation in chores Moderately limited  8 c. Visiting family/friends Moderately limited     Assessment and Plan:   Patricia Mata is a 65 y.o. female with symptoms of severe, stage D1 aortic stenosis with NYHA Class IV symptoms current admitted for resting chest pain in the setting of profound anemia. I have reviewed the patient's recent  echocardiogram which is notable for preserved LV systolic function with EF 50-55%, mild AI and progression of severe AS with a mean gradient of 65 mm hg, peak gradient of 104.6 mm hg, AVA 0.71 cm2, DVI 0.23. There was also mild RV dysfunction, severe pulm HTN with RSVP 89.7 mm Hg, moderate MS (mean gradient 7 mm Hg, MVA 1.5cm^2), moderate-severe MAC. Pt has had hstroponin bump >7000, which could be demand ischemia in the setting of advanced CKD. L/RHC is planned for Friday with Dr. Haroldine Laws.   I have reviewed the natural history of aortic stenosis with the patient. We have discussed the limitations of medical therapy and the poor prognosis associated with symptomatic aortic stenosis. We have reviewed potential treatment options, including palliative medical therapy, conventional surgical aortic valve replacement, and transcatheter aortic valve replacement. We discussed treatment options in the context of this patient's specific comorbid medical conditions.    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 Other significant comorbid conditions include severe pulmonary HTN and limited functional status. TAVR at this point in time would be very risky with increased risk of endocartitis in done in the setting of temproary dialysis catheter. We could consider proceeding with balloon valvuloplasty as a bridge to TAVR if the patient does well clinically. This would help her tolerate HD better and allow her AV fistula to mature before proceeding with potential bioprosthetic valve placement. Additionally, it would be very difficult to add antiplatelets after TAVR with her severe, recurrent anemia. Will see her tomorrow morning and see how she tolerates HD. Will plan to proceed with pre  TAVR CTs tomorrow which will help guide potential balloon valvuloplasty size based on measurements of annulus.   Dr. Burt Knack to follow.        TIMI Risk Score for Unstable Angina or Non-ST Elevation MI:   The patient's TIMI risk score is 6, which indicates a 41% risk of all cause mortality, new or recurrent myocardial infarction or need for urgent revascularization in the next 14 days.   New York Heart Association (NYHA) Functional Class NYHA Class IV  CHA2DS2-VASc Score = 8  This indicates a 10.8% annual risk of stroke. The patient's score is based upon: CHF History: Yes HTN History: Yes Diabetes History: Yes Stroke History: Yes Vascular Disease History: Yes Age Score: 1 Gender Score: 1         For questions  or updates, please contact [place] Please consult [email or web address] for contact info under    Signed, Angelena Form, PA-C  10/11/2020 12:15 PM  Patient seen, examined. Available data reviewed. Agree with findings, assessment, and plan as outlined by Nell Range, PA-C.  The patient is independently interviewed and examined.  She is alert, oriented, in no distress.  JVP is moderately elevated.  Carotid upstrokes are normal with bilateral bruits.  Heart is regular rate and rhythm with a 3/6 late peaking harsh crescendo decrescendo murmur, absent A2.  Abdomen is soft and nontender.  Extremities have no significant edema.  Skin is warm and dry without rash.  This is a medically complex 65 year old woman with severe symptomatic aortic stenosis.  I have reviewed her recent echocardiogram which demonstrates mild LV dysfunction with LVEF 50 to 55%, critical aortic stenosis with a peak transaortic velocity greater than 5 m/s and mean gradient 65 mmHg, dimensionless index 0.23.  The aortic valve is severely calcified and restricted on 2D echo imaging.  She has severe comorbid conditions including stage V kidney disease now requiring dialysis because of uremia and uremic  platelet dysfunction with epistaxis.  The patient has transfusion dependent myelodysplastic syndrome and presented to the hospital this admission with a hemoglobin less than 5 (in the context of epistaxis).  She has undergone AV fistula placement but her fistula is not yet mature and has now undergone temporary dialysis catheter placement with plans for dialysis this afternoon.  Despite her complex medical problems, she lives independently with the help of her husband.  She has a challenging myriad of problems, but it seems reasonable to treat her aortic stenosis as it will be very difficult for her to tolerate hemodialysis in the setting of critical aortic stenosis.  We have concerns about implanting an aortic prosthesis while temporary dialysis catheter is in place because of risk of bacterial seeding and endocarditis.  It may ultimately be most prudent to consider balloon valvuloplasty as a bridge to definitive therapy while her other medical problems can be stabilized.  The patient will undergo right and left heart catheterization by Dr. Haroldine Laws over the next few days.  We will obtain CT angiogram studies of the heart as well as the chest, abdomen and pelvis.  This will better define anatomic suitability for balloon valvuloplasty and TAVR.  Plan discussed with the patient and her husband who is at the bedside.  We will follow up tomorrow.  Sherren Mocha, M.D. 10/11/2020 4:30 PM

## 2020-10-11 NOTE — Progress Notes (Signed)
[facility] Progress Note   Assessment/Plan  Patricia Mata is an 65 y.o. female with CKD 5, HTN, AS (severe), mitral stenosis (mod/severe), gout, pulmonary HTN, DM, MDS, gout who presented with chest pain, found to have severe anemia in the 4s and is seen for AKI on CKD.   **Chest pain, severe AS: thought to be demand ischemia in setting of severe anemia and AS and now resolved.  Cardiology is following and plan LHC in the coming days.  Notes indicate BAV as bridge to TAVR.  TTE with trivial effusion.  No signs of pericarditis on EKG and no rub.   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  With volume overload and I suspect uremic platelet dysfunction leading to the nose bleeds and life threatening anemia,  I have discussed with her that I believe we should initiate RRT while she is hospitalized.  She will need an HD catheter placed as her AVF is still maturing -- IR will place [facility] today and she will have HD #1 today.  Given her AS she may have poor tolerance of HD but we will attempt to manage.  Cardiology is discussing timing of valve replacement.  I had a long discussion with she, sister, son yesterday and all in agreement.   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  Transfuse pRBC PRN Hb < 7.  I think part of the issue lately has been epistaxis which may be exacerbated by uremic platelet dysfunction.   **Metabolic acidosis: cont po supplement for now, stop after a few HD treatments.    **HTN:  Cont home medications. Well controlled currently.   **Pulmonary HTN: on sildenafil.   **A fibrillation, paroxysmal:  On amiodarone.    Will follow, call with clinical status changes.   Subjective/Interval Events:   No new issues, feeling ok this AM.  No epistaxis overnight.  Awaiting TDC placement and HD#1 today.    Objective Vitals:   10/11/20  0011 10/11/20 0300 10/11/20 0427 10/11/20 0846  BP: (!) 123/40 (!) 124/43 (!) 125/43 (!) 147/32  Pulse: 67  64 63  Resp: 18  20 18   Temp: 99 F (37.2 C)  98.6 F (37 C) 98.9 F (37.2 C)  TempSrc: Oral  Oral Oral  SpO2: 95% 96% 97%   Weight:  93.9 kg 93.9 kg   Height:       Physical Exam Gen: awake and alert, looks comfortable in bed Eyes: anicteric ENT: MMM Neck: supple CV:  RRR, IV/VI SEM, no rub Abd:  soft Lungs: normal WOB on 2L [place], dec BS bases GU: no foley Extr:  No edema, RUE +t/b, not mature Neuro: no asterixis, conversant Skin: no rashes  Additional Objective Labs: Basic Metabolic Panel: Recent Labs  Lab 10/09/20 0554 10/10/20 0939 10/11/20 0357  NA 132* 134* 135  K 3.1* 5.2* 4.2  CL 98 99 100  CO2 20* 24 24  GLUCOSE 208* 160* 161*  BUN 123* 153* 163*  CREATININE 4.05* 4.78* 5.15*  CALCIUM 9.0 9.5 9.5   Liver Function Tests: Recent Labs  Lab 10/09/20 0554  AST 113*  ALT 75*  ALKPHOS 110  BILITOT 1.0  PROT 6.0*  ALBUMIN 3.1*   Recent Labs  Lab 10/09/20 0554  LIPASE 32   CBC: Recent Labs  Lab 10/09/20 1540 10/10/20 0939 10/11/20 0357  WBC 6.7 8.0 8.8  NEUTROABS 5.5 6.6  --   HGB 4.6* 8.3* 7.3*  HCT 14.2* 23.8* 20.9*  MCV 97.9 90.2 89.7  PLT 196 178 191   Blood Culture    Component Value Date/Time   SDES  09/24/2019 1144    TRACHEAL ASPIRATE Performed at [facility], [place] [address], [place], [place] [address]    SPECREQUEST  09/24/2019 1144    NONE Performed at [facility] [place] [address], [place], [place] [address]    CULT FEW CANDIDA ALBICANS 09/24/2019 1144   REPTSTATUS 09/27/2019 FINAL 09/24/2019 1144    Cardiac Enzymes: No results for input(s): CKTOTAL, CKMB, CKMBINDEX, TROPONINI in the last 168 hours. CBG: Recent Labs  Lab 10/10/20 0126 10/10/20 0805 10/10/20 1156 10/10/20 1631 10/10/20 2047  GLUCAP 137* 177* 128* 99 125*   Iron Studies: No results for input(s): IRON,  TIBC, TRANSFERRIN, FERRITIN in the last 72 hours. @lablastinr3 @ Studies/Results: ECHOCARDIOGRAM COMPLETE  Result Date: 10/09/2020    ECHOCARDIOGRAM REPORT   Patient Name:   Patricia Mata Coach Date of Exam: 10/09/2020 Medical Rec #:  [id number]            Height:       64.0 in Accession #:    [id number]           Weight:       210.3 lb Date of Birth:  07-Apr-1956             BSA:          1.999 m Patient Age:    28 years             BP:           132/51 mmHg Patient Gender: F                    HR:           72 bpm. Exam Location:  Inpatient Procedure: 2D Echo, Cardiac Doppler and Color Doppler Indications:    Pericardial effusion I31.3  History:        Patient has prior history of Echocardiogram examinations, most                 recent 06/07/2020. CHF, Pulmonary HTN, AS, Arrythmias:Atrial                 Fibrillation; Risk Factors:Hypertension and Diabetes.  Sonographer:    Bernadene Person RDCS Referring Phys: [id number] [place]  1. Left ventricular ejection fraction, by estimation, is 50 to 55%. The left ventricle has low normal function. The left ventricle has no regional wall motion abnormalities. The left ventricular internal cavity size was mildly dilated. Left ventricular diastolic parameters are indeterminate.  2. Right ventricular systolic function is mildly reduced. The right ventricular size is normal. There is severely elevated pulmonary artery systolic pressure. The estimated right ventricular systolic pressure is 93.7 mmHg.  3. Left atrial size was mildly dilated.  4. Right atrial size was mildly dilated.  5. The mitral valve is degenerative. Trivial mitral valve regurgitation. Moderate mitral stenosis. The mean mitral valve gradient is 7.0 mmHg with MVA 1.5 cm^2 by VTI. Moderate to severe mitral annular calcification.  6. The aortic valve is tricuspid. Aortic valve regurgitation is mild. Severe aortic valve stenosis. Aortic valve area, by VTI measures 0.71 cm. Aortic valve mean  gradient measures 65.0 mmHg.  7. The inferior vena cava is dilated in size with >50% respiratory variability, suggesting right atrial pressure  of 8 mmHg.  8. The patient was in atrial fibrillation. FINDINGS  Left Ventricle: Left ventricular ejection fraction, by estimation, is 50 to 55%. The left ventricle has low normal function. The left ventricle has no regional wall motion abnormalities. The left ventricular internal cavity size was mildly dilated. There is no left ventricular hypertrophy. Left ventricular diastolic parameters are indeterminate. Right Ventricle: The right ventricular size is normal. No increase in right ventricular wall thickness. Right ventricular systolic function is mildly reduced. There is severely elevated pulmonary artery systolic pressure. The tricuspid regurgitant velocity is 4.52 m/s, and with an assumed right atrial pressure of 8 mmHg, the estimated right ventricular systolic pressure is 00.1 mmHg. Left Atrium: Left atrial size was mildly dilated. Right Atrium: Right atrial size was mildly dilated. Pericardium: Trivial pericardial effusion is present. Mitral Valve: The mitral valve is degenerative in appearance. There is moderate calcification of the mitral valve leaflet(s). Moderate to severe mitral annular calcification. Trivial mitral valve regurgitation. Moderate mitral valve stenosis. MV peak gradient, 26.0 mmHg. The mean mitral valve gradient is 7.0 mmHg. Tricuspid Valve: The tricuspid valve is normal in structure. Tricuspid valve regurgitation is trivial. Aortic Valve: The aortic valve is tricuspid. Aortic valve regurgitation is mild. Aortic regurgitation PHT measures 247 msec. Severe aortic stenosis is present. Aortic valve mean gradient measures 65.0 mmHg. Aortic valve peak gradient measures 104.6 mmHg.  Aortic valve area, by VTI measures 0.71 cm. Pulmonic Valve: The pulmonic valve was normal in structure. Pulmonic valve regurgitation is trivial. Aorta: The aortic root is  normal in size and structure. Venous: The inferior vena cava is dilated in size with greater than 50% respiratory variability, suggesting right atrial pressure of 8 mmHg. IAS/Shunts: No atrial level shunt detected by color flow Doppler.  LEFT VENTRICLE PLAX 2D LVIDd:         5.80 cm  Diastology LVIDs:         3.90 cm  LV e' medial:  6.25 cm/s LV PW:         1.00 cm  LV e' lateral: 5.98 cm/s LV IVS:        0.80 cm LVOT diam:     2.00 cm LV SV:         86 LV SV Index:   43 LVOT Area:     3.14 cm  RIGHT VENTRICLE RV S prime:     8.11 cm/s TAPSE (M-mode): 2.1 cm LEFT ATRIUM             Index       RIGHT ATRIUM           Index LA diam:        5.10 cm 2.55 cm/m  RA Area:     19.10 cm LA Vol (A2C):   71.4 ml 35.72 ml/m RA Volume:   50.20 ml  25.12 ml/m LA Vol (A4C):   74.1 ml 37.08 ml/m LA Biplane Vol: 74.3 ml 37.18 ml/m  AORTIC VALVE AV Area (Vmax):    0.71 cm AV Area (Vmean):   0.70 cm AV Area (VTI):     0.71 cm AV Vmax:           511.33 cm/s AV Vmean:          354.000 cm/s AV VTI:            1.217 m AV Peak Grad:      104.6 mmHg AV Mean Grad:      65.0 mmHg LVOT Vmax:  115.50 cm/s LVOT Vmean:        78.900 cm/s LVOT VTI:          0.274 m LVOT/AV VTI ratio: 0.23 AI PHT:            247 msec  AORTA Ao Root diam: 3.20 cm Ao Asc diam:  3.00 cm MITRAL VALVE              TRICUSPID VALVE MV Area (PHT): 2.97 cm   TR Peak grad:   81.7 mmHg MV Area VTI:   1.32 cm   TR Vmax:        452.00 cm/s MV Peak grad:  26.0 mmHg MV Mean grad:  7.0 mmHg   SHUNTS MV Vmax:       2.55 m/s   Systemic VTI:  0.27 m MV Vmean:      113.0 cm/s Systemic Diam: 2.00 cm MR Peak grad: 24.4 mmHg MR Vmax:      247.00 cm/s Loralie Champagne MD Electronically signed by Loralie Champagne MD Signature Date/Time: 10/09/2020/4:36:06 PM    Final    Medications:  . allopurinol  100 mg Oral Daily  . amiodarone  200 mg Oral Daily  . atorvastatin  40 mg Oral Daily  . carvedilol  6.25 mg Oral BID  . Chlorhexidine Gluconate Cloth  6 each Topical Q0600   . cloNIDine  0.1 mg Oral BID  . fluticasone  1 spray Each Nare Daily  . furosemide  40 mg Oral Daily  . hydrALAZINE  100 mg Oral TID  . insulin aspart  0-9 Units Subcutaneous TID WC  . insulin glargine  20 Units Subcutaneous Daily  . latanoprost  1 drop Both Eyes QHS  . levothyroxine  150 mcg Oral QAC breakfast  . linagliptin  5 mg Oral Daily  . loratadine  10 mg Oral QODAY  . pantoprazole  40 mg Oral Daily  . sildenafil  20 mg Oral TID  . sodium bicarbonate  650 mg Oral BID        Jannifer Hick MD 10/11/2020, 8:58 AM  [facility]

## 2020-10-11 NOTE — Progress Notes (Signed)
PROGRESS NOTE    Patricia Mata  MWU:[id number] DOB: [birth year] DOA: 10/09/2020 PCP: [name], FNP    Brief Narrative:  Patient is 65 year old female with history of paroxysmal A. fib, pulmonary hypertension, severe aortic stenosis, type 2 diabetes on insulin, severe hypertension, CKD stage V, colon cancer status post hemicolectomy, myelodysplastic syndrome and transfusion dependent anemia, chronic diastolic heart failure and epistaxis who presented to the emergency room with chest pain, found to have elevated troponins, severe aortic stenosis, fluid overload and since then admitted to the hospital and treated for following conditions.   Assessment & Plan:   Principal Problem:   NSTEMI (non-ST elevated myocardial infarction) ([place]) Active Problems:   Anemia due to chronic kidney disease   Anemia in chronic kidney disease   MDS (myelodysplastic syndrome) (HCC)   Essential hypertension   Aortic stenosis, severe   Carotid artery disease (HCC)   PAF (paroxysmal atrial fibrillation) (HCC)   Hypothyroidism   Pulmonary hypertension (HCC)   DM2 (diabetes mellitus, type 2) (HCC)   Elevated troponin   Chronic kidney disease, stage 5 (HCC)   Symptomatic anemia   Angina pectoris (HCC)   Angina at rest [facility])  Non-STEMI: Troponin peaked at 7000, EKG with global ischemia, hemoglobin 4.9.  Currently symptoms controlled. Cardiology planning for [facility] on 4/1 after dialysis.  Currently remains on aspirin, beta-blockers and hydralazine.  Severe aortic stenosis and moderate mitral stenosis: Followed by heart failure team.  Cardiac cath and subsequent referral for TAVR.  CKD stage V with fluid overload: Followed by nephrology.  Planning for permacath today and subsequent hemodialysis.  Paroxysmal A. fib: On normal sinus rhythm.  Currently on amiodarone and rate controlled.  Not on anticoagulation due to recurrent severe anemia and transfusion dependent anemia.  Myelodysplastic  syndrome/transfusion dependent anemia/anemia of chronic kidney disease: Continues to need transfusion.  If needed, she can be transfused with dialysis.  Hemoglobin 7.3 today.  No indication for transfusion.  Hypothyroidism: On Synthroid.  Replaced.  Type 2 diabetes on insulin: Continue Lantus and sliding scale insulin.  Adequate.    DVT prophylaxis: Place and maintain sequential compression device Start: 10/09/20 1657   Code Status: Full code Family Communication: None at the bedside. Disposition Plan: Status is: Inpatient  Remains inpatient appropriate because:Ongoing diagnostic testing needed not appropriate for outpatient work up and IV treatments appropriate due to intensity of illness or inability to take PO   Dispo: The patient is from: Home              Anticipated d/c is to: Home              Patient currently is not medically stable to d/c.   Difficult to place patient No         Consultants:   Nephrology  Cardiology  Procedures:   Permacath placement 3/30  Hemodialysis 3/30  Antimicrobials:   None   Subjective: Patient was seen and examined in the morning rounds.  She denied any complaints.  She was looking forward to go for HD catheter placement and subsequent dialysis.  Objective: Vitals:   10/11/20 1425 10/11/20 1430 10/11/20 1500 10/11/20 1530  BP: (!) 139/57 137/63 (!) 134/50 (!) 139/48  Pulse: 61     Resp: _0 Temp:      TempSrc:      SpO2:      Weight:      Height:        Intake/Output Summary (Last 24 hours) at 10/11/2020  [employer] filed at 10/11/2020 1400 Gross per 24 hour  Intake 300 ml  Output 250 ml  Net 50 ml   Filed Weights   10/11/20 0300 10/11/20 0427 10/11/20 1423  Weight: 93.9 kg 93.9 kg 93.8 kg    Examination:  General exam: Appears calm and comfortable  Not in any distress.  On room air. Respiratory system: Clear to auscultation. Respiratory effort normal. Cardiovascular system: S1 & S2 heard,  RRR.  No edema. Gastrointestinal system: Abdomen is nondistended, soft and nontender. No organomegaly or masses felt. Normal bowel sounds heard. Central nervous system: Alert and oriented. No focal neurological deficits. Extremities: Symmetric 5 x 5 power. Skin: No rashes, lesions or ulcers Psychiatry: Judgement and insight appear normal. Mood & affect appropriate.     Data Reviewed: I have personally reviewed following labs and imaging studies  CBC: Recent Labs  Lab 10/09/20 1540 10/10/20 0939 10/11/20 0357  WBC 6.7 8.0 8.8  NEUTROABS 5.5 6.6  --   HGB 4.6* 8.3* 7.3*  HCT 14.2* 23.8* 20.9*  MCV 97.9 90.2 89.7  PLT 196 178 001   Basic Metabolic Panel: Recent Labs  Lab 10/09/20 0554 10/10/20 0939 10/11/20 0357  NA 132* 134* 135  K 3.1* 5.2* 4.2  CL 98 99 100  CO2 20* 24 24  GLUCOSE 208* 160* 161*  BUN 123* 153* 163*  CREATININE 4.05* 4.78* 5.15*  CALCIUM 9.0 9.5 9.5   GFR: Estimated Creatinine Clearance: 12.1 mL/min (A) (by C-G formula based on SCr of 5.15 mg/dL (H)). Liver Function Tests: Recent Labs  Lab 10/09/20 0554  AST 113*  ALT 75*  ALKPHOS 110  BILITOT 1.0  PROT 6.0*  ALBUMIN 3.1*   Recent Labs  Lab 10/09/20 0554  LIPASE 32   No results for input(s): AMMONIA in the last 168 hours. Coagulation Profile: Recent Labs  Lab 10/10/20 1052  INR 1.4*   Cardiac Enzymes: No results for input(s): CKTOTAL, CKMB, CKMBINDEX, TROPONINI in the last 168 hours. BNP (last 3 results) No results for input(s): PROBNP in the last 8760 hours. HbA1C: No results for input(s): HGBA1C in the last 72 hours. CBG: Recent Labs  Lab 10/10/20 1156 10/10/20 1631 10/10/20 2047 10/11/20 0748 10/11/20 1233  GLUCAP 128* 99 125* 152* 154*   Lipid Profile: No results for input(s): CHOL, HDL, LDLCALC, TRIG, CHOLHDL, LDLDIRECT in the last 72 hours. Thyroid Function Tests: No results for input(s): TSH, T4TOTAL, FREET4, T3FREE, THYROIDAB in the last 72 hours. Anemia  Panel: Recent Labs    10/09/20 1656  RETICCTPCT 1.8   Sepsis Labs: No results for input(s): PROCALCITON, LATICACIDVEN in the last 168 hours.  Recent Results (from the past 240 hour(s))  Resp Panel by RT-PCR (Flu A&B, Covid) Nasopharyngeal Swab     Status: None   Collection Time: 10/09/20  1:37 PM   Specimen: Nasopharyngeal Swab; Nasopharyngeal(NP) swabs in vial transport medium  Result Value Ref Range Status   SARS Coronavirus 2 by RT PCR NEGATIVE NEGATIVE Final    Comment: (NOTE) SARS-CoV-2 target nucleic acids are NOT DETECTED.  The SARS-CoV-2 RNA is generally detectable in upper respiratory specimens during the acute phase of infection. The lowest concentration of SARS-CoV-2 viral copies this assay can detect is 138 copies/mL. A negative result does not preclude SARS-Cov-2 infection and should not be used as the sole basis for treatment or other patient management decisions. A negative result may occur with  improper specimen collection/handling, submission of specimen other than nasopharyngeal swab, presence of viral  mutation(s) within the areas targeted by this assay, and inadequate number of viral copies(<138 copies/mL). A negative result must be combined with clinical observations, patient history, and epidemiological information. The expected result is Negative.  Fact Sheet for Patients:  EntrepreneurPulse.com.au  Fact Sheet for Healthcare Providers:  IncredibleEmployment.be  This test is no t yet approved or cleared by the [place] FDA and  has been authorized for detection and/or diagnosis of SARS-CoV-2 by FDA under an Emergency Use Authorization (EUA). This EUA will remain  in effect (meaning this test can be used) for the duration of the COVID-19 declaration under Section 564(b)(1) of the Act, 21 U.S.C.section 360bbb-3(b)(1), unless the authorization is terminated  or revoked sooner.       Influenza A by PCR NEGATIVE  NEGATIVE Final   Influenza B by PCR NEGATIVE NEGATIVE Final    Comment: (NOTE) The Xpert Xpress SARS-CoV-2/FLU/RSV plus assay is intended as an aid in the diagnosis of influenza from Nasopharyngeal swab specimens and should not be used as a sole basis for treatment. Nasal washings and aspirates are unacceptable for Xpert Xpress SARS-CoV-2/FLU/RSV testing.  Fact Sheet for Patients: EntrepreneurPulse.com.au  Fact Sheet for Healthcare Providers: IncredibleEmployment.be  This test is not yet approved or cleared by the [place] FDA and has been authorized for detection and/or diagnosis of SARS-CoV-2 by FDA under an Emergency Use Authorization (EUA). This EUA will remain in effect (meaning this test can be used) for the duration of the COVID-19 declaration under Section 564(b)(1) of the Act, 21 U.S.C. section 360bbb-3(b)(1), unless the authorization is terminated or revoked.  Performed at [facility], [place] [address] [place], [place] [address]          Radiology Studies: IR Fluoro Guide CV Line Right  Result Date: 10/11/2020 INDICATION: 65 year old female requiring initiation of hemodialysis due to malfunctioning upper extremity fistula. EXAM: TUNNELED CENTRAL VENOUS HEMODIALYSIS CATHETER PLACEMENT WITH ULTRASOUND AND FLUOROSCOPIC GUIDANCE MEDICATIONS: Vancomycin 1 gm IV . The antibiotic was given in an appropriate time interval prior to skin puncture. ANESTHESIA/SEDATION: Moderate (conscious) sedation was employed during this procedure. A total of Versed 1 mg and Fentanyl 25 mcg was administered intravenously. Moderate Sedation Time: 10 minutes. The patient's level of consciousness and vital signs were monitored continuously by radiology nursing throughout the procedure under my direct supervision. FLUOROSCOPY TIME:  0 minutes 6 seconds (1 mGy). COMPLICATIONS: None immediate. PROCEDURE: Informed written consent was obtained from the patient  after a discussion of the risks, benefits, and alternatives to treatment. Questions regarding the procedure were encouraged and answered. The right neck and chest were prepped with chlorhexidine in a sterile fashion, and a sterile drape was applied covering the operative field. Maximum barrier sterile technique with sterile gowns and gloves were used for the procedure. A timeout was performed prior to the initiation of the procedure. After creating a small venotomy incision, a 21 gauge micropuncture kit was utilized to access the internal jugular vein. Real-time ultrasound guidance was utilized for vascular access including the acquisition of a permanent ultrasound image documenting patency of the accessed vessel. A Wholey wire was advanced to the level of the IVC and the micropuncture sheath was exchanged for an 8 Fr dilator. A 14.5 French tunneled hemodialysis catheter measuring 23 cm from tip to cuff was tunneled in a retrograde fashion from the anterior chest wall to the venotomy incision. Serial dilation was then performed an a peel-away sheath was placed. The catheter was then placed through the peel-away sheath with  the catheter tip ultimately positioned within the right atrium. Final catheter positioning was confirmed and documented with a spot radiographic image. The catheter aspirates and flushes normally. The catheter was flushed with appropriate volume heparin dwells. The catheter exit site was secured with a 0-Prolene retention suture. The venotomy incision was closed with Dermabond. Sterile dressings were applied. The patient tolerated the procedure well without immediate post procedural complication. IMPRESSION: Successful placement of 23 cm tip to cuff tunneled hemodialysis catheter via the right internal jugular vein with catheter tip terminating within the right atrium. The catheter is ready for immediate use. Ruthann Cancer, MD [facility]  Electronically Signed   By: Ruthann Cancer MD   On: 10/11/2020 12:35   IR US Guide Vasc Access Right  Result Date: 10/11/2020 INDICATION: 65 year old female requiring initiation of hemodialysis due to malfunctioning upper extremity fistula. EXAM: TUNNELED CENTRAL VENOUS HEMODIALYSIS CATHETER PLACEMENT WITH ULTRASOUND AND FLUOROSCOPIC GUIDANCE MEDICATIONS: Vancomycin 1 gm IV . The antibiotic was given in an appropriate time interval prior to skin puncture. ANESTHESIA/SEDATION: Moderate (conscious) sedation was employed during this procedure. A total of Versed 1 mg and Fentanyl 25 mcg was administered intravenously. Moderate Sedation Time: 10 minutes. The patient's level of consciousness and vital signs were monitored continuously by radiology nursing throughout the procedure under my direct supervision. FLUOROSCOPY TIME:  0 minutes 6 seconds (1 mGy). COMPLICATIONS: None immediate. PROCEDURE: Informed written consent was obtained from the patient after a discussion of the risks, benefits, and alternatives to treatment. Questions regarding the procedure were encouraged and answered. The right neck and chest were prepped with chlorhexidine in a sterile fashion, and a sterile drape was applied covering the operative field. Maximum barrier sterile technique with sterile gowns and gloves were used for the procedure. A timeout was performed prior to the initiation of the procedure. After creating a small venotomy incision, a 21 gauge micropuncture kit was utilized to access the internal jugular vein. Real-time ultrasound guidance was utilized for vascular access including the acquisition of a permanent ultrasound image documenting patency of the accessed vessel. A Wholey wire was advanced to the level of the IVC and the micropuncture sheath was exchanged for an 8 Fr dilator. A 14.5 French tunneled hemodialysis catheter measuring 23 cm from tip to cuff was tunneled in a retrograde fashion from the anterior chest wall to  the venotomy incision. Serial dilation was then performed an a peel-away sheath was placed. The catheter was then placed through the peel-away sheath with the catheter tip ultimately positioned within the right atrium. Final catheter positioning was confirmed and documented with a spot radiographic image. The catheter aspirates and flushes normally. The catheter was flushed with appropriate volume heparin dwells. The catheter exit site was secured with a 0-Prolene retention suture. The venotomy incision was closed with Dermabond. Sterile dressings were applied. The patient tolerated the procedure well without immediate post procedural complication. IMPRESSION: Successful placement of 23 cm tip to cuff tunneled hemodialysis catheter via the right internal jugular vein with catheter tip terminating within the right atrium. The catheter is ready for immediate use. Ruthann Cancer, MD [facility] Electronically Signed   By: Ruthann Cancer MD   On: 10/11/2020 12:35   ECHOCARDIOGRAM COMPLETE  Result Date: 10/09/2020    ECHOCARDIOGRAM REPORT   Patient Name:   [facility] [place] Date of Exam: 10/09/2020 Medical Rec #:  [id number]  Height:       64.0 in Accession #:    [id number]           Weight:       210.3 lb Date of Birth:  08-06-1955             BSA:          1.999 m Patient Age:    43 years             BP:           132/51 mmHg Patient Gender: F                    HR:           72 bpm. Exam Location:  Inpatient Procedure: 2D Echo, Cardiac Doppler and Color Doppler Indications:    Pericardial effusion I31.3  History:        Patient has prior history of Echocardiogram examinations, most                 recent 06/07/2020. CHF, Pulmonary HTN, AS, Arrythmias:Atrial                 Fibrillation; Risk Factors:Hypertension and Diabetes.  Sonographer:    Bernadene Person RDCS Referring Phys: [id number] [place]  1. Left ventricular ejection fraction,  by estimation, is 50 to 55%. The left ventricle has low normal function. The left ventricle has no regional wall motion abnormalities. The left ventricular internal cavity size was mildly dilated. Left ventricular diastolic parameters are indeterminate.  2. Right ventricular systolic function is mildly reduced. The right ventricular size is normal. There is severely elevated pulmonary artery systolic pressure. The estimated right ventricular systolic pressure is 91.9 mmHg.  3. Left atrial size was mildly dilated.  4. Right atrial size was mildly dilated.  5. The mitral valve is degenerative. Trivial mitral valve regurgitation. Moderate mitral stenosis. The mean mitral valve gradient is 7.0 mmHg with MVA 1.5 cm^2 by VTI. Moderate to severe mitral annular calcification.  6. The aortic valve is tricuspid. Aortic valve regurgitation is mild. Severe aortic valve stenosis. Aortic valve area, by VTI measures 0.71 cm. Aortic valve mean gradient measures 65.0 mmHg.  7. The inferior vena cava is dilated in size with >50% respiratory variability, suggesting right atrial pressure of 8 mmHg.  8. The patient was in atrial fibrillation. FINDINGS  Left Ventricle: Left ventricular ejection fraction, by estimation, is 50 to 55%. The left ventricle has low normal function. The left ventricle has no regional wall motion abnormalities. The left ventricular internal cavity size was mildly dilated. There is no left ventricular hypertrophy. Left ventricular diastolic parameters are indeterminate. Right Ventricle: The right ventricular size is normal. No increase in right ventricular wall thickness. Right ventricular systolic function is mildly reduced. There is severely elevated pulmonary artery systolic pressure. The tricuspid regurgitant velocity is 4.52 m/s, and with an assumed right atrial pressure of 8 mmHg, the estimated right ventricular systolic pressure is 16.6 mmHg. Left Atrium: Left atrial size was mildly dilated. Right Atrium:  Right atrial size was mildly dilated. Pericardium: Trivial pericardial effusion is present. Mitral Valve: The mitral valve is degenerative in appearance. There is moderate calcification of the mitral valve leaflet(s). Moderate to severe mitral annular calcification. Trivial mitral valve regurgitation. Moderate mitral valve stenosis. MV peak gradient, 26.0 mmHg. The mean mitral valve gradient is 7.0 mmHg. Tricuspid Valve: The tricuspid valve is normal in structure. Tricuspid valve regurgitation is trivial.  Aortic Valve: The aortic valve is tricuspid. Aortic valve regurgitation is mild. Aortic regurgitation PHT measures 247 msec. Severe aortic stenosis is present. Aortic valve mean gradient measures 65.0 mmHg. Aortic valve peak gradient measures 104.6 mmHg.  Aortic valve area, by VTI measures 0.71 cm. Pulmonic Valve: The pulmonic valve was normal in structure. Pulmonic valve regurgitation is trivial. Aorta: The aortic root is normal in size and structure. Venous: The inferior vena cava is dilated in size with greater than 50% respiratory variability, suggesting right atrial pressure of 8 mmHg. IAS/Shunts: No atrial level shunt detected by color flow Doppler.  LEFT VENTRICLE PLAX 2D LVIDd:         5.80 cm  Diastology LVIDs:         3.90 cm  LV e' medial:  6.25 cm/s LV PW:         1.00 cm  LV e' lateral: 5.98 cm/s LV IVS:        0.80 cm LVOT diam:     2.00 cm LV SV:         86 LV SV Index:   43 LVOT Area:     3.14 cm  RIGHT VENTRICLE RV S prime:     8.11 cm/s TAPSE (M-mode): 2.1 cm LEFT ATRIUM             Index       RIGHT ATRIUM           Index LA diam:        5.10 cm 2.55 cm/m  RA Area:     19.10 cm LA Vol (A2C):   71.4 ml 35.72 ml/m RA Volume:   50.20 ml  25.12 ml/m LA Vol (A4C):   74.1 ml 37.08 ml/m LA Biplane Vol: 74.3 ml 37.18 ml/m  AORTIC VALVE AV Area (Vmax):    0.71 cm AV Area (Vmean):   0.70 cm AV Area (VTI):     0.71 cm AV Vmax:           511.33 cm/s AV Vmean:          354.000 cm/s AV VTI:             1.217 m AV Peak Grad:      104.6 mmHg AV Mean Grad:      65.0 mmHg LVOT Vmax:         115.50 cm/s LVOT Vmean:        78.900 cm/s LVOT VTI:          0.274 m LVOT/AV VTI ratio: 0.23 AI PHT:            247 msec  AORTA Ao Root diam: 3.20 cm Ao Asc diam:  3.00 cm MITRAL VALVE              TRICUSPID VALVE MV Area (PHT): 2.97 cm   TR Peak grad:   81.7 mmHg MV Area VTI:   1.32 cm   TR Vmax:        452.00 cm/s MV Peak grad:  26.0 mmHg MV Mean grad:  7.0 mmHg   SHUNTS MV Vmax:       2.55 m/s   Systemic VTI:  0.27 m MV Vmean:      113.0 cm/s Systemic Diam: 2.00 cm MR Peak grad: 24.4 mmHg MR Vmax:      247.00 cm/s Loralie Champagne MD Electronically signed by Loralie Champagne MD Signature Date/Time: 10/09/2020/4:36:06 PM    Final         Scheduled  Meds: . allopurinol  100 mg Oral Daily  . amiodarone  200 mg Oral Daily  . [START ON 10/12/2020] aspirin  81 mg Oral Pre-Cath  . atorvastatin  40 mg Oral Daily  . carvedilol  6.25 mg Oral BID  . Chlorhexidine Gluconate Cloth  6 each Topical Q0600  . cloNIDine  0.1 mg Oral BID  . fluticasone  1 spray Each Nare Daily  . furosemide  40 mg Oral Daily  . hydrALAZINE  100 mg Oral TID  . insulin aspart  0-9 Units Subcutaneous TID WC  . insulin glargine  20 Units Subcutaneous Daily  . latanoprost  1 drop Both Eyes QHS  . levothyroxine  150 mcg Oral QAC breakfast  . linagliptin  5 mg Oral Daily  . loratadine  10 mg Oral QODAY  . pantoprazole  40 mg Oral Daily  . sildenafil  20 mg Oral TID  . sodium bicarbonate  650 mg Oral BID  . sodium chloride flush  3 mL Intravenous Q12H   Continuous Infusions: . sodium chloride    . sodium chloride    . sodium chloride    . [START ON 10/12/2020] sodium chloride       LOS: 2 days    Time spent: 32 minutes    Barb Merino, MD [facility] Pager [phone]

## 2020-10-11 NOTE — Progress Notes (Addendum)
Advanced Heart Failure Rounding Note  PCP: Darleen Crocker Primary Cardiologist:   Subjective:    Denies any epistaxis or further dark stools, hgb drifted down 1g to 7.3.  She denies dyspnea or chest pain.      Objective:   Weight Range: 93.9 kg Body mass index is 35.55 kg/m.   Vital Signs:   Temp:  [98.4 F (36.9 C)-99 F (37.2 C)] 98.6 F (37 C) (03/30 0427) Pulse Rate:  [59-100] 64 (03/30 0427) Resp:  [13-20] 20 (03/30 0427) BP: (121-159)/(36-109) 125/43 (03/30 0427) SpO2:  [93 %-100 %] 97 % (03/30 0427) Weight:  [93.9 kg] 93.9 kg (03/30 0427)    Weight change: Filed Weights   10/09/20 0353 10/11/20 0300 10/11/20 0427  Weight: 95.4 kg 93.9 kg 93.9 kg    Intake/Output:   Intake/Output Summary (Last 24 hours) at 10/11/2020 0753 Last data filed at 10/10/2020 1802 Gross per 24 hour  Intake 100 ml  Output --  Net 100 ml      Physical Exam    Cardiac: JVP to jaw, normal rate and rhythm, clear s1 and s2, 3/6 AS murmur high pitched with diminished S2, no rubs or gallops, bilateral LE edema non pitting Pulmonary: decreased bases with bilateral rales, not in distress Abdominal: non distended abdomen, soft and nontender Nares: no blood present, signs of dead skin on septum bilaterally Psych: Alert, conversant, in good spirits    Telemetry   Ectopic atrial rhythm vs sinus rhythm with pronounced 1st degree block and occasional PAC's  EKG    NSR vs ectopic atrial rhythm rate of 61, no acute ischemia, ST depressions remain absent  Labs    CBC Recent Labs    10/09/20 1540 10/10/20 0939 10/11/20 0357  WBC 6.7 8.0 8.8  NEUTROABS 5.5 6.6  --   HGB 4.6* 8.3* 7.3*  HCT 14.2* 23.8* 20.9*  MCV 97.9 90.2 89.7  PLT 196 178 562   Basic Metabolic Panel Recent Labs    10/10/20 0939 10/11/20 0357  NA 134* 135  K 5.2* 4.2  CL 99 100  CO2 24 24  GLUCOSE 160* 161*  BUN 153* 163*  CREATININE 4.78* 5.15*  CALCIUM 9.5 9.5   Liver Function Tests Recent  Labs    10/09/20 0554  AST 113*  ALT 75*  ALKPHOS 110  BILITOT 1.0  PROT 6.0*  ALBUMIN 3.1*   Recent Labs    10/09/20 0554  LIPASE 32   Cardiac Enzymes No results for input(s): CKTOTAL, CKMB, CKMBINDEX, TROPONINI in the last 72 hours.  BNP: BNP (last 3 results) Recent Labs    06/07/20 0826 07/06/20 1733  BNP 489.2* 932.6*    ProBNP (last 3 results) No results for input(s): PROBNP in the last 8760 hours.   D-Dimer No results for input(s): DDIMER in the last 72 hours. Hemoglobin A1C No results for input(s): HGBA1C in the last 72 hours. Fasting Lipid Panel No results for input(s): CHOL, HDL, LDLCALC, TRIG, CHOLHDL, LDLDIRECT in the last 72 hours. Thyroid Function Tests No results for input(s): TSH, T4TOTAL, T3FREE, THYROIDAB in the last 72 hours.  Invalid input(s): FREET3  Other results:   Imaging    No results found.   Medications:     Scheduled Medications: . allopurinol  100 mg Oral Daily  . amiodarone  200 mg Oral Daily  . atorvastatin  40 mg Oral Daily  . carvedilol  6.25 mg Oral BID  . Chlorhexidine Gluconate Cloth  6 each Topical Q0600  .  cloNIDine  0.1 mg Oral BID  . fluticasone  1 spray Each Nare Daily  . furosemide  40 mg Oral Daily  . hydrALAZINE  100 mg Oral TID  . insulin aspart  0-9 Units Subcutaneous TID WC  . insulin glargine  20 Units Subcutaneous Daily  . latanoprost  1 drop Both Eyes QHS  . levothyroxine  150 mcg Oral QAC breakfast  . linagliptin  5 mg Oral Daily  . loratadine  10 mg Oral QODAY  . pantoprazole  40 mg Oral Daily  . sildenafil  20 mg Oral TID  . sodium bicarbonate  650 mg Oral BID    Infusions:   PRN Medications: acetaminophen, diphenhydrAMINE, HYDROmorphone (DILAUDID) injection, nitroGLYCERIN, ondansetron (ZOFRAN) IV, oxyCODONE-acetaminophen, sodium chloride    Patient Profile   Patricia Mata is a 65 y.o. 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 Karle Starch.  Assessment/Plan   Chest pain with elevated troponin and ST depression on EKG -elevated troponin highest value 7,352 last night may not have peaked, ecg with global ischemia in the setting of markedly low hgb 4.9.  No CAD by [facility] 2019 however  CAD by HRCT scan on 02/2020, no RWMA on echo this admission.  -s/p 3 u PRBC's transfusion ecg overnight with resolution of ST depression on ecg and symptoms -needs LHC timing tbd likely next 1-2 days   Severe aortic stenosis and moderate mitral stenosis - 09/2020 AVA 0.71, mean gradient 65 compared with 0.84 and 66 on 05/2020 - MS moderate mean gradient is 7, MVA 1.5 - This will make dialysis difficult, needs L/RHC in the next day or two -  Discuss BAV as bridge to TAVR with multidisciplinary valve team  PAH - suspect multifactorial in nature suspect including high output due to anemia, WHO group II (diastolic HF and valvular disease) and III (OSA) but given degree of PH cannot exclude WHO Group I component. - RHC 2019 with severe PAH; repeat [facility] 04/2019 with improved PA pressures - prior VQ negative, PFTs 8/20 with normal spirometry and moderately reduced DLCO. - sleep study still pending - on sildenafil, continue  CKD stage V - nephrology following plan to initiate dialysis during admission   Paroxysmal atrial fibrillation - maintaining NSR on home amiodarone - not on [place] due to transfusion dependent anemia  Chronic diastolic CHF - EF normal but with severe AS, MS and G2DD - Markedly volume overloaded on exam but breathing okay - BP okay on home meds may need to adjust with dialysis -  Discuss with nephrology UF removal/diuretic therapy  MDS -Diagnosed in 2017 treated with 5-azacytidine finished 7 cycles of therapy completed in 2017.  -Currently only on a hematopoiesis stimulator luspatercept , last oncology note says any further treatments would be palliative  Chronic blood loss anemia/anemia of chronic kidney disease and MDS: -recurrent epistaxis uses prn afrin had ENT evaluation in the past -had noticeable episodes as recent as 3-4 days prior to admission -hgb down another gram to 7.3 would give another unit with dialysis today  Length of Stay: 2   Katherine Roan, MD  10/11/2020, 7:53 AM  Advanced Heart Failure Team Pager [phone] (M-F; [place])  Please contact [facility] for night-coverage after hours (4p -7a ) and weekends on [email or web address]   Patient seen and examined with the above-signed Advanced Practice Provider and/or Housestaff. I personally reviewed laboratory data, imaging studies and relevant notes. I independently examined the patient and formulated the important aspects of the plan. I have edited the note to reflect any of my changes or salient points. I have personally discussed the plan with the patient and/or family.  No further CP. Volume status elevated but denies dyspnea or orthopnea. Creatinine continues to rise now 5.2. BUN 163. TDC placed today. Will have HD#1 today. Hgb stable.  General:  Sitting up No resp difficulty HEENT: normal Neck: supple. JVP to jaw Carotids 2+ bilat; + bruits. No lymphadenopathy or thryomegaly appreciated. Cor: PMI nondisplaced. Regular rate & rhythm. 3/6 AS Lungs: clear Abdomen: soft, nontender, nondistended. No hepatosplenomegaly. No bruits or masses. Good bowel sounds. Extremities: no cyanosis, clubbing, rash, tr-1+ edema Neuro: alert & orientedx3, cranial nerves grossly intact. moves all 4 extremities w/o difficulty. Affect pleasant  Will see how she tolerates HD. If unable to tolerate will need to consider  bridging BAV to allow AVF to mature prior to TAVR (discussed with structural heart team and Renal). Plan cath Friday am to exclude high grade CAD in setting of NSTEMI.   Glori Bickers, MD  2:22 PM

## 2020-10-11 NOTE — Procedures (Signed)
Interventional Radiology Procedure Note  Procedure: Tunneled hemodialysis catheter placement  Findings: Please refer to procedural dictation for full description.  14.5 Fr, 23 cm right IJ tunneled hemodialysis catheter placed, tip in right atrium.  Complications: None immediate  Estimated Blood Loss: < 5 mL  Recommendations: Catheter ready for immediate use.   Ruthann Cancer, MD

## 2020-10-11 NOTE — Progress Notes (Signed)
   RHC/LHC set up for Friday per Dr Haroldine Laws.   Ryver Zadrozny NP-C  11:57 AM

## 2020-10-11 NOTE — Progress Notes (Signed)
Renal Navigator attempted to meet with patient in room to discuss outpatient HD referral for treatment for ESRD as requested by Dr. Arlyss Gandy, but she was not in her room.  3:00pm: Navigator attempted again to meet with patient at HD bedside, but this is her first treatment and she is groggy. She is pleasant, states she is feeling well, but cannot carry on much of a conversation at this time. Navigator informed her of outpatient referral to be completed. She has only ever heard of "[facility]." Navigator explained that this is not the closest clinic to her home and states that [place] is most likely the closest that may have a seat available. Navigator began to tell her about TCU, but TCU is twice as far for patient, so not sure she will be interested. Navigator will submit referral to request [place] and can change if patient and family are interested in TCU so not to cause delay in referral. Navigator told patient that we will follow up tomorrow when she is not on HD treatment. She agreed.   Alphonzo Cruise, [place] Renal Navigator [phone]

## 2020-10-11 NOTE — Plan of Care (Signed)

## 2020-10-12 ENCOUNTER — Inpatient Hospital Stay (HOSPITAL_COMMUNITY): Payer: Medicare Other

## 2020-10-12 ENCOUNTER — Ambulatory Visit: Payer: BC Managed Care – PPO

## 2020-10-12 ENCOUNTER — Other Ambulatory Visit: Payer: Self-pay

## 2020-10-12 DIAGNOSIS — I214 Non-ST elevation (NSTEMI) myocardial infarction: Secondary | ICD-10-CM | POA: Diagnosis not present

## 2020-10-12 DIAGNOSIS — I350 Nonrheumatic aortic (valve) stenosis: Secondary | ICD-10-CM

## 2020-10-12 LAB — CBC
HCT: 21 % — ABNORMAL LOW (ref 36.0–46.0)
Hemoglobin: 7.2 g/dL — ABNORMAL LOW (ref 12.0–15.0)
MCH: 31.7 pg (ref 26.0–34.0)
MCHC: 34.3 g/dL (ref 30.0–36.0)
MCV: 92.5 fL (ref 80.0–100.0)
Platelets: 175 10*3/uL (ref 150–400)
RBC: 2.27 MIL/uL — ABNORMAL LOW (ref 3.87–5.11)
RDW: 16.3 % — ABNORMAL HIGH (ref 11.5–15.5)
WBC: 7.4 10*3/uL (ref 4.0–10.5)
nRBC: 3.9 % — ABNORMAL HIGH (ref 0.0–0.2)

## 2020-10-12 LAB — BASIC METABOLIC PANEL
Anion gap: 8 (ref 5–15)
BUN: 88 mg/dL — ABNORMAL HIGH (ref 8–23)
CO2: 26 mmol/L (ref 22–32)
Calcium: 8.9 mg/dL (ref 8.9–10.3)
Chloride: 102 mmol/L (ref 98–111)
Creatinine, Ser: 3.83 mg/dL — ABNORMAL HIGH (ref 0.44–1.00)
GFR, Estimated: 12 mL/min — ABNORMAL LOW (ref >60)
Glucose, Bld: 143 mg/dL — ABNORMAL HIGH (ref 70–99)
Potassium: 3.8 mmol/L (ref 3.5–5.1)
Sodium: 136 mmol/L (ref 135–145)

## 2020-10-12 LAB — GLUCOSE, CAPILLARY
Glucose-Capillary: 174 mg/dL — ABNORMAL HIGH (ref 70–99)
Glucose-Capillary: 116 mg/dL — ABNORMAL HIGH (ref 70–99)
Glucose-Capillary: 115 mg/dL — ABNORMAL HIGH (ref 70–99)
Glucose-Capillary: 167 mg/dL — ABNORMAL HIGH (ref 70–99)

## 2020-10-12 LAB — MRSA PCR SCREENING: MRSA by PCR: NEGATIVE

## 2020-10-12 MED ORDER — SILVER NITRATE-POT NITRATE 75-25 % EX MISC
1.0000 | Freq: Once | CUTANEOUS | Status: DC | PRN
  Filled 2020-10-12: qty 1

## 2020-10-12 MED ORDER — OXYMETAZOLINE HCL 0.05 % NA SOLN
1.0000 | Freq: Once | NASAL | Status: DC | PRN
  Filled 2020-10-12: qty 30

## 2020-10-12 MED ORDER — LIDOCAINE HCL URETHRAL/MUCOSAL 2 % EX GEL
1.0000 "application " | Freq: Once | CUTANEOUS | Status: DC | PRN
  Filled 2020-10-12 (×2): qty 11

## 2020-10-12 MED ORDER — LIDOCAINE HCL 4 % EX SOLN
0.0000 mL | Freq: Once | CUTANEOUS | Status: DC | PRN
  Filled 2020-10-12 (×2): qty 50

## 2020-10-12 MED ORDER — LIDOCAINE-EPINEPHRINE (PF) 1 %-1:200000 IJ SOLN
0.0000 mL | Freq: Once | INTRAMUSCULAR | Status: DC | PRN
  Filled 2020-10-12: qty 30

## 2020-10-12 MED ORDER — SODIUM CHLORIDE 0.9 % IV SOLN
1.0000 g | INTRAVENOUS | Status: DC
  Administered 2020-10-12 – 2020-10-13 (×2): 1 g via INTRAVENOUS
  Filled 2020-10-12 (×2): qty 10

## 2020-10-12 MED ORDER — BACITRACIN-NEOMYCIN-POLYMYXIN 400-5-5000 EX OINT
TOPICAL_OINTMENT | Freq: Once | CUTANEOUS | Status: DC | PRN
  Filled 2020-10-12: qty 1

## 2020-10-12 MED ORDER — LIDOCAINE HCL 2 % EX GEL
1.0000 "application " | Freq: Once | CUTANEOUS | Status: DC | PRN
  Filled 2020-10-12: qty 4250

## 2020-10-12 MED ORDER — IOHEXOL 350 MG/ML SOLN
100.0000 mL | Freq: Once | INTRAVENOUS | Status: AC | PRN
  Administered 2020-10-12: 100 mL via INTRAVENOUS

## 2020-10-12 MED ORDER — TRAZODONE HCL 50 MG PO TABS
50.0000 mg | ORAL_TABLET | Freq: Every evening | ORAL | Status: DC | PRN
  Administered 2020-10-12 – 2020-10-15 (×4): 50 mg via ORAL
  Filled 2020-10-12 (×4): qty 1

## 2020-10-12 MED ORDER — TRIPLE ANTIBIOTIC 3.5-400-5000 EX OINT
1.0000 "application " | TOPICAL_OINTMENT | Freq: Once | CUTANEOUS | Status: DC | PRN
  Filled 2020-10-12: qty 1

## 2020-10-12 MED ORDER — POLYETHYLENE GLYCOL 3350 17 G PO PACK
17.0000 g | PACK | Freq: Every day | ORAL | Status: DC
  Administered 2020-10-12 – 2020-10-16 (×4): 17 g via ORAL
  Filled 2020-10-12 (×5): qty 1

## 2020-10-12 NOTE — Progress Notes (Signed)
TRH night shift.  The nursing staff reported that the patient would like to have something to help her sleep.  She tried diphenhydramine 25 mg p.o. last night with no results.  Trazodone 50 mg p.o. nightly as needed ordered.  Tennis Must, MD.

## 2020-10-12 NOTE — Progress Notes (Signed)
PROGRESS NOTE    Patricia Mata  LYY:[id number] DOB: 10/13/1955 DOA: 10/09/2020 PCP: [name], FNP    Brief Narrative:  Patient is 65 year old female with history of paroxysmal A. fib, pulmonary hypertension, severe aortic stenosis, type 2 diabetes on insulin, severe hypertension, CKD stage V, colon cancer status post hemicolectomy, myelodysplastic syndrome and transfusion dependent anemia, chronic diastolic heart failure and epistaxis who presented to the emergency room with chest pain, found to have elevated troponins, severe aortic stenosis, fluid overload and since then admitted to the hospital and treated for following conditions.   Assessment & Plan:   Principal Problem:   NSTEMI (non-ST elevated myocardial infarction) ([place]) Active Problems:   Anemia due to chronic kidney disease   Anemia in chronic kidney disease   MDS (myelodysplastic syndrome) (HCC)   Essential hypertension   Aortic stenosis, severe   Carotid artery disease (HCC)   PAF (paroxysmal atrial fibrillation) (HCC)   Hypothyroidism   Pulmonary hypertension (HCC)   DM2 (diabetes mellitus, type 2) (HCC)   Elevated troponin   Chronic kidney disease, stage 5 (HCC)   Symptomatic anemia   Angina pectoris (HCC)   Angina at rest [facility])  Non-STEMI: Troponin peaked at 7000, EKG with global ischemia, hemoglobin 4.9.  Currently symptoms controlled. Cardiology planning for [facility] on 4/1.  Currently remains on aspirin, beta-blockers and hydralazine.  Severe aortic stenosis and moderate mitral stenosis: Followed by heart failure team.  Cardiac cath and subsequent referral for TAVR. CT scans today.  Is scheduled for cardiac cath tomorrow.  ESRD /CKD stage V with fluid overload: Followed by nephrology.  Received first dialysis on 3/30 and tolerated well.  She is getting another short run of dialysis today.  Paroxysmal A. fib: On normal sinus rhythm.  Currently on amiodarone and rate controlled.  Not on  anticoagulation due to recurrent severe anemia and transfusion dependent anemia as well has epistaxis.  Myelodysplastic syndrome/transfusion dependent anemia/anemia of chronic kidney disease: Continues to need transfusion.  If needed, she can be transfused with dialysis.  Hemoglobin 7.1 today.  No indication for transfusion.  Hypothyroidism: On Synthroid.  Stable.  Type 2 diabetes on insulin: Continue Lantus and sliding scale insulin.  Adequate.  Recurrent epistaxis: Had epistaxis overnight.  Cauterized and packed by ENT, removal in 5 days.    DVT prophylaxis: Place and maintain sequential compression device Start: 10/09/20 1657   Code Status: Full code Family Communication: None today. Disposition Plan: Status is: Inpatient  Remains inpatient appropriate because:Ongoing diagnostic testing needed not appropriate for outpatient work up and IV treatments appropriate due to intensity of illness or inability to take PO   Dispo: The patient is from: Home              Anticipated d/c is to: Home              Patient currently is not medically stable to d/c.   Difficult to place patient No         Consultants:   Nephrology  Cardiology  Procedures:   Permacath placement 3/30  Hemodialysis 3/30  Antimicrobials:   None   Subjective: Patient seen and examined in the morning.  Overnight events noted.  She had ongoing nasal bleeding and Dr. Constance Holster from ENT came and packed it. She was very excited that dialysis went very well and she had no trouble with that.  Denies any chest pain or shortness of breath.  Objective: Vitals:   10/12/20 1130 10/12/20 1200 10/12/20 1230 10/12/20 1309  BP: (!) 135/50 (!) 141/48 (!) 142/50 (!) 150/67  Pulse: 64 65 65   Resp:  17 15   Temp:      TempSrc:      SpO2:      Weight:      Height:        Intake/Output Summary (Last 24 hours) at 10/12/2020 1418 Last data filed at 10/12/2020 1316 Gross per 24 hour  Intake 3 ml  Output 2000 ml   Net -1997 ml   Filed Weights   10/11/20 1700 10/12/20 0444 10/12/20 1000  Weight: 92.8 kg 92.4 kg 92 kg    Examination:  General exam: Appears calm and comfortable  Not in any distress.  On room air.  Laying in bed. Left nostril is packed, no active bleeding. Respiratory system: Clear to auscultation. Respiratory effort normal. Right chest permacath is clean and dry. Cardiovascular system: S1 & S2 heard, RRR.  No edema. Gastrointestinal system: Abdomen is nondistended, soft and nontender. No organomegaly or masses felt. Normal bowel sounds heard. Central nervous system: Alert and oriented. No focal neurological deficits. Extremities: Symmetric 5 x 5 power. Skin: No rashes, lesions or ulcers Psychiatry: Judgement and insight appear normal. Mood & affect appropriate.     Data Reviewed: I have personally reviewed following labs and imaging studies  CBC: Recent Labs  Lab 10/09/20 1540 10/10/20 0939 10/11/20 0357 10/12/20 0212  WBC 6.7 8.0 8.8 7.4  NEUTROABS 5.5 6.6  --   --   HGB 4.6* 8.3* 7.3* 7.2*  HCT 14.2* 23.8* 20.9* 21.0*  MCV 97.9 90.2 89.7 92.5  PLT 196 178 191 474   Basic Metabolic Panel: Recent Labs  Lab 10/09/20 0554 10/10/20 0939 10/11/20 0357 10/12/20 0212  NA 132* 134* 135 136  K 3.1* 5.2* 4.2 3.8  CL 98 99 100 102  CO2 20* '24 24 26  ' GLUCOSE 208* 160* 161* 143*  BUN 123* 153* 163* 88*  CREATININE 4.05* 4.78* 5.15* 3.83*  CALCIUM 9.0 9.5 9.5 8.9   GFR: Estimated Creatinine Clearance: 16.1 mL/min (A) (by C-G formula based on SCr of 3.83 mg/dL (H)). Liver Function Tests: Recent Labs  Lab 10/09/20 0554  AST 113*  ALT 75*  ALKPHOS 110  BILITOT 1.0  PROT 6.0*  ALBUMIN 3.1*   Recent Labs  Lab 10/09/20 0554  LIPASE 32   No results for input(s): AMMONIA in the last 168 hours. Coagulation Profile: Recent Labs  Lab 10/10/20 1052  INR 1.4*   Cardiac Enzymes: No results for input(s): CKTOTAL, CKMB, CKMBINDEX, TROPONINI in the last 168  hours. BNP (last 3 results) No results for input(s): PROBNP in the last 8760 hours. HbA1C: No results for input(s): HGBA1C in the last 72 hours. CBG: Recent Labs  Lab 10/11/20 1233 10/11/20 1721 10/11/20 2152 10/12/20 0728 10/12/20 1300  GLUCAP 154* 98 194* 174* 116*   Lipid Profile: No results for input(s): CHOL, HDL, LDLCALC, TRIG, CHOLHDL, LDLDIRECT in the last 72 hours. Thyroid Function Tests: No results for input(s): TSH, T4TOTAL, FREET4, T3FREE, THYROIDAB in the last 72 hours. Anemia Panel: Recent Labs    10/09/20 1656  RETICCTPCT 1.8   Sepsis Labs: No results for input(s): PROCALCITON, LATICACIDVEN in the last 168 hours.  Recent Results (from the past 240 hour(s))  Resp Panel by RT-PCR (Flu A&B, Covid) Nasopharyngeal Swab     Status: None   Collection Time: 10/09/20  1:37 PM   Specimen: Nasopharyngeal Swab; Nasopharyngeal(NP) swabs in vial transport medium  Result Value Ref Range  Status   SARS Coronavirus 2 by RT PCR NEGATIVE NEGATIVE Final    Comment: (NOTE) SARS-CoV-2 target nucleic acids are NOT DETECTED.  The SARS-CoV-2 RNA is generally detectable in upper respiratory specimens during the acute phase of infection. The lowest concentration of SARS-CoV-2 viral copies this assay can detect is 138 copies/mL. A negative result does not preclude SARS-Cov-2 infection and should not be used as the sole basis for treatment or other patient management decisions. A negative result may occur with  improper specimen collection/handling, submission of specimen other than nasopharyngeal swab, presence of viral mutation(s) within the areas targeted by this assay, and inadequate number of viral copies(<138 copies/mL). A negative result must be combined with clinical observations, patient history, and epidemiological information. The expected result is Negative.  Fact Sheet for Patients:  EntrepreneurPulse.com.au  Fact Sheet for Healthcare Providers:   IncredibleEmployment.be  This test is no t yet approved or cleared by the [place] FDA and  has been authorized for detection and/or diagnosis of SARS-CoV-2 by FDA under an Emergency Use Authorization (EUA). This EUA will remain  in effect (meaning this test can be used) for the duration of the COVID-19 declaration under Section 564(b)(1) of the Act, 21 U.S.C.section 360bbb-3(b)(1), unless the authorization is terminated  or revoked sooner.       Influenza A by PCR NEGATIVE NEGATIVE Final   Influenza B by PCR NEGATIVE NEGATIVE Final    Comment: (NOTE) The Xpert Xpress SARS-CoV-2/FLU/RSV plus assay is intended as an aid in the diagnosis of influenza from Nasopharyngeal swab specimens and should not be used as a sole basis for treatment. Nasal washings and aspirates are unacceptable for Xpert Xpress SARS-CoV-2/FLU/RSV testing.  Fact Sheet for Patients: EntrepreneurPulse.com.au  Fact Sheet for Healthcare Providers: IncredibleEmployment.be  This test is not yet approved or cleared by the [place] FDA and has been authorized for detection and/or diagnosis of SARS-CoV-2 by FDA under an Emergency Use Authorization (EUA). This EUA will remain in effect (meaning this test can be used) for the duration of the COVID-19 declaration under Section 564(b)(1) of the Act, 21 U.S.C. section 360bbb-3(b)(1), unless the authorization is terminated or revoked.  Performed at [facility], [place] [address] [place], [place] [address]   MRSA PCR Screening     Status: None   Collection Time: 10/11/20  9:38 PM   Specimen: Nasal Mucosa; Nasopharyngeal  Result Value Ref Range Status   MRSA by PCR NEGATIVE NEGATIVE Final    Comment:        The GeneXpert MRSA Assay (FDA approved for NASAL specimens only), is one component of a comprehensive MRSA colonization surveillance program. It is not intended to diagnose MRSA infection nor  to guide or monitor treatment for MRSA infections. Performed at [facility], [place] [address], [place], [place] [address]          Radiology Studies: IR Fluoro Guide CV Line Right  Result Date: 10/11/2020 INDICATION: 65 year old female requiring initiation of hemodialysis due to malfunctioning upper extremity fistula. EXAM: TUNNELED CENTRAL VENOUS HEMODIALYSIS CATHETER PLACEMENT WITH ULTRASOUND AND FLUOROSCOPIC GUIDANCE MEDICATIONS: Vancomycin 1 gm IV . The antibiotic was given in an appropriate time interval prior to skin puncture. ANESTHESIA/SEDATION: Moderate (conscious) sedation was employed during this procedure. A total of Versed 1 mg and Fentanyl 25 mcg was administered intravenously. Moderate Sedation Time: 10 minutes. The patient's level of consciousness and vital signs were monitored continuously by radiology nursing throughout the procedure under my direct supervision. FLUOROSCOPY  TIME:  0 minutes 6 seconds (1 mGy). COMPLICATIONS: None immediate. PROCEDURE: Informed written consent was obtained from the patient after a discussion of the risks, benefits, and alternatives to treatment. Questions regarding the procedure were encouraged and answered. The right neck and chest were prepped with chlorhexidine in a sterile fashion, and a sterile drape was applied covering the operative field. Maximum barrier sterile technique with sterile gowns and gloves were used for the procedure. A timeout was performed prior to the initiation of the procedure. After creating a small venotomy incision, a 21 gauge micropuncture kit was utilized to access the internal jugular vein. Real-time ultrasound guidance was utilized for vascular access including the acquisition of a permanent ultrasound image documenting patency of the accessed vessel. A Wholey wire was advanced to the level of the IVC and the micropuncture sheath was exchanged for an 8 Fr dilator. A 14.5 French tunneled hemodialysis catheter  measuring 23 cm from tip to cuff was tunneled in a retrograde fashion from the anterior chest wall to the venotomy incision. Serial dilation was then performed an a peel-away sheath was placed. The catheter was then placed through the peel-away sheath with the catheter tip ultimately positioned within the right atrium. Final catheter positioning was confirmed and documented with a spot radiographic image. The catheter aspirates and flushes normally. The catheter was flushed with appropriate volume heparin dwells. The catheter exit site was secured with a 0-Prolene retention suture. The venotomy incision was closed with Dermabond. Sterile dressings were applied. The patient tolerated the procedure well without immediate post procedural complication. IMPRESSION: Successful placement of 23 cm tip to cuff tunneled hemodialysis catheter via the right internal jugular vein with catheter tip terminating within the right atrium. The catheter is ready for immediate use. Ruthann Cancer, MD [facility] Radiology Electronically Signed   By: Ruthann Cancer MD   On: 10/11/2020 12:35   IR US Guide Vasc Access Right  Result Date: 10/11/2020 INDICATION: 65 year old female requiring initiation of hemodialysis due to malfunctioning upper extremity fistula. EXAM: TUNNELED CENTRAL VENOUS HEMODIALYSIS CATHETER PLACEMENT WITH ULTRASOUND AND FLUOROSCOPIC GUIDANCE MEDICATIONS: Vancomycin 1 gm IV . The antibiotic was given in an appropriate time interval prior to skin puncture. ANESTHESIA/SEDATION: Moderate (conscious) sedation was employed during this procedure. A total of Versed 1 mg and Fentanyl 25 mcg was administered intravenously. Moderate Sedation Time: 10 minutes. The patient's level of consciousness and vital signs were monitored continuously by radiology nursing throughout the procedure under my direct supervision. FLUOROSCOPY TIME:  0 minutes 6 seconds (1 mGy). COMPLICATIONS: None  immediate. PROCEDURE: Informed written consent was obtained from the patient after a discussion of the risks, benefits, and alternatives to treatment. Questions regarding the procedure were encouraged and answered. The right neck and chest were prepped with chlorhexidine in a sterile fashion, and a sterile drape was applied covering the operative field. Maximum barrier sterile technique with sterile gowns and gloves were used for the procedure. A timeout was performed prior to the initiation of the procedure. After creating a small venotomy incision, a 21 gauge micropuncture kit was utilized to access the internal jugular vein. Real-time ultrasound guidance was utilized for vascular access including the acquisition of a permanent ultrasound image documenting patency of the accessed vessel. A Wholey wire was advanced to the level of the IVC and the micropuncture sheath was exchanged for an 8 Fr dilator. A 14.5 French tunneled hemodialysis catheter measuring 23 cm from tip to cuff was tunneled in a retrograde  fashion from the anterior chest wall to the venotomy incision. Serial dilation was then performed an a peel-away sheath was placed. The catheter was then placed through the peel-away sheath with the catheter tip ultimately positioned within the right atrium. Final catheter positioning was confirmed and documented with a spot radiographic image. The catheter aspirates and flushes normally. The catheter was flushed with appropriate volume heparin dwells. The catheter exit site was secured with a 0-Prolene retention suture. The venotomy incision was closed with Dermabond. Sterile dressings were applied. The patient tolerated the procedure well without immediate post procedural complication. IMPRESSION: Successful placement of 23 cm tip to cuff tunneled hemodialysis catheter via the right internal jugular vein with catheter tip terminating within the right atrium. The catheter is ready for immediate use. Ruthann Cancer, MD [facility] Radiology Electronically Signed   By: Ruthann Cancer MD   On: 10/11/2020 12:35        Scheduled Meds: . allopurinol  100 mg Oral Daily  . amiodarone  200 mg Oral Daily  . aspirin  81 mg Oral Pre-Cath  . atorvastatin  40 mg Oral Daily  . carvedilol  6.25 mg Oral BID  . Chlorhexidine Gluconate Cloth  6 each Topical Q0600  . cloNIDine  0.1 mg Oral BID  . fluticasone  1 spray Each Nare Daily  . furosemide  40 mg Oral Daily  . hydrALAZINE  100 mg Oral TID  . insulin aspart  0-9 Units Subcutaneous TID WC  . insulin glargine  20 Units Subcutaneous Daily  . latanoprost  1 drop Both Eyes QHS  . levothyroxine  150 mcg Oral QAC breakfast  . linagliptin  5 mg Oral Daily  . loratadine  10 mg Oral QODAY  . pantoprazole  40 mg Oral Daily  . sildenafil  20 mg Oral TID  . sodium chloride flush  3 mL Intravenous Q12H   Continuous Infusions: . sodium chloride    . sodium chloride    . cefTRIAXone (ROCEPHIN)  IV       LOS: 3 days    Time spent: 32 minutes    Barb Merino, MD [facility] Pager [phone]

## 2020-10-12 NOTE — Progress Notes (Signed)
  [facility]   MULTIDISCIPLINARY HEART VALVE TEAM  Discussed with Dr. Johnney Ou that pt is cleared for CT scans as she started HD and is tolerating it well so far. To be done today.  Angelena Form PA-C  [facility]

## 2020-10-12 NOTE — Consult Note (Signed)
Reason for Consult: Epistaxis Referring Physician: Barb Merino, MD  Patricia Mata is an 65 y.o. female.  HPI: Patient has had chronic epistaxis for years.  She uses Afrin nasal spray periodically to stop the bleeding.  She saw Dr. Wilburn Cornelia in our office about a month ago who performed a thorough examination including nasal endoscopy and was unable to identify bleeding source.  She has had continuing bleeding.  Is always from the left side and typically starts draining down her throat.  She had severe bleeding over the past couple of days and her hemoglobin dropped down to 4 and she underwent transfusion.  She suffered a myocardial infarction due to the severe anemia.  Past Medical History:  Diagnosis Date  . Acute renal failure (ARF) ([place]) 06/08/2018  . Carotid artery disease ([place])   . Chronic diastolic (congestive) heart failure ([place])   . Colon cancer ([place])   . Diabetes mellitus ([place])   . Gout 09/08/2018  . Hypertension   . Hypothyroidism   . Macrocytic anemia   . MDS (myelodysplastic syndrome) ([place])   . Moderate mitral stenosis   . PAD (peripheral artery disease) ([place])   . PAF (paroxysmal atrial fibrillation) ([place])   . Pulmonary hypertension ([place])   . Renal artery stenosis ([facility])    a. >60% R renal artery stenosis by duplex 2021, managed medically.  . Severe aortic stenosis   . Stage 4 chronic kidney disease ([place])   . Transfusion-dependent anemia     Past Surgical History:  Procedure Laterality Date  . AV FISTULA PLACEMENT Right 09/26/2020   Procedure: RIGHT ARM ARTERIOVENOUS (AV) FISTULA CREATION;  Surgeon: Waynetta Sandy, MD;  Location: [facility] OR;  Service: Vascular;  Laterality: Right;  Monitor Anesthesia Care with Regional Block to Right Arm   . COLON SURGERY    . DEBRIDMENT OF DECUBITUS ULCER N/A 06/12/2018   Procedure: DEBRIDMENT OF DECUBITUS ULCER;  Surgeon: Wallace Going, DO;  Location: [facility] ORS;  Service: Plastics;  Laterality: N/A;  .  ESOPHAGOGASTRODUODENOSCOPY N/A 09/01/2020   Procedure: ESOPHAGOGASTRODUODENOSCOPY (EGD);  Surgeon: Clarene Essex, MD;  Location: [facility];  Service: Endoscopy;  Laterality: N/A;  . GIVENS CAPSULE STUDY N/A 09/01/2020   Procedure: GIVENS CAPSULE STUDY;  Surgeon: Clarene Essex, MD;  Location: [facility] ENDOSCOPY;  Service: Endoscopy;  Laterality: N/A;  . IR FLUORO GUIDE CV LINE RIGHT  10/11/2020  . IR FLUORO GUIDE PORT INSERTION RIGHT  04/02/2017  . IR REMOVAL TUN ACCESS W/ PORT W/O FL MOD SED  03/16/2019  . IR US GUIDE VASC ACCESS RIGHT  04/02/2017  . IR US GUIDE VASC ACCESS RIGHT  10/11/2020  . RIGHT HEART CATH N/A 04/23/2019   Procedure: RIGHT HEART CATH;  Surgeon: Jolaine Artist, MD;  Location: [facility] LAB;  Service: Cardiovascular;  Laterality: N/A;  . RIGHT/LEFT HEART CATH AND CORONARY ANGIOGRAPHY N/A 05/21/2018   Procedure: RIGHT/LEFT HEART CATH AND CORONARY ANGIOGRAPHY;  Surgeon: Nelva Bush, MD;  Location: [facility] CV LAB;  Service: Cardiovascular;  Laterality: N/A;  . TUBAL LIGATION      Family History  Problem Relation Age of Onset  . Hypertension Mother   . Diabetes Mother   . Cervical cancer Mother   . Heart attack Father   . Hypertension Sister   . Hypertension Brother   . Hypertension Sister   . Hypertension Sister   . Prostate cancer Brother   . HIV/AIDS Brother     Social History:  reports that she quit smoking about  19 years ago. Her smoking use included cigarettes. She has a 5.00 pack-year smoking history. She has quit using smokeless tobacco. She reports current alcohol use of about 2.0 standard drinks of alcohol per week. She reports that she does not use drugs.  Allergies:  Allergies  Allergen Reactions  . Ancef [Cefazolin] Itching    Severe itching- after procedure, ancef was the antibiotic.-04/02/17 Tolerates penicillins  . Lactose Intolerance (Gi) Diarrhea  . Sulfa Antibiotics Rash and Other (See Comments)    Blisters, also    Medications:  Reviewed  Results for orders placed or performed during the hospital encounter of 10/09/20 (from the past 48 hour(s))  Protime-INR     Status: Abnormal   Collection Time: 10/10/20 10:52 AM  Result Value Ref Range   Prothrombin Time 16.9 (H) 11.4 - 15.2 seconds   INR 1.4 (H) 0.8 - 1.2    Comment: (NOTE) INR goal varies based on device and disease states. Performed at [facility], [place] [address] [place], [place] [address]   APTT     Status: None   Collection Time: 10/10/20 10:52 AM  Result Value Ref Range   aPTT 27 24 - 36 seconds    Comment: Performed at [facility] [address] [place],  [address]  CBG monitoring, ED     Status: Abnormal   Collection Time: 10/10/20 11:56 AM  Result Value Ref Range   Glucose-Capillary 128 (H) 70 - 99 mg/dL    Comment: Glucose reference range applies only to samples taken after fasting for at least 8 hours.  Glucose, capillary     Status: None   Collection Time: 10/10/20  4:31 PM  Result Value Ref Range   Glucose-Capillary 99 70 - 99 mg/dL    Comment: Glucose reference range applies only to samples taken after fasting for at least 8 hours.  Glucose, capillary     Status: Abnormal   Collection Time: 10/10/20  8:47 PM  Result Value Ref Range   Glucose-Capillary 125 (H) 70 - 99 mg/dL    Comment: Glucose reference range applies only to samples taken after fasting for at least 8 hours.  Basic metabolic panel     Status: Abnormal   Collection Time: 10/11/20  3:57 AM  Result Value Ref Range   Sodium 135 135 - 145 mmol/L   Potassium 4.2 3.5 - 5.1 mmol/L   Chloride 100 98 - 111 mmol/L   CO2 24 22 - 32 mmol/L   Glucose, Bld 161 (H) 70 - 99 mg/dL    Comment: Glucose reference range applies only to samples taken after fasting for at least 8 hours.   BUN 163 (H) 8 - 23 mg/dL   Creatinine, Ser 5.15 (H) 0.44 - 1.00 mg/dL   Calcium 9.5 8.9 - 10.3 mg/dL   GFR, Estimated 9 (L) >60 mL/min    Comment: (NOTE) Calculated using the  CKD-EPI Creatinine Equation (2021)    Anion gap 11 5 - 15    Comment: Performed at [facility] [address] [place], [place] [address]  CBC     Status: Abnormal   Collection Time: 10/11/20  3:57 AM  Result Value Ref Range   WBC 8.8 4.0 - 10.5 K/uL    Comment: WHITE COUNT CONFIRMED ON SMEAR   RBC 2.33 (L) 3.87 - 5.11 MIL/uL   Hemoglobin 7.3 (L) 12.0 - 15.0 g/dL   HCT 20.9 (L) 36.0 - 46.0 %   MCV 89.7 80.0 -  100.0 fL   MCH 31.3 26.0 - 34.0 pg   MCHC 34.9 30.0 - 36.0 g/dL   RDW 15.9 (H) 11.5 - 15.5 %   Platelets 191 150 - 400 K/uL   nRBC 3.2 (H) 0.0 - 0.2 %    Comment: Performed at [facility] [address], [place], [place] [address]  Glucose, capillary     Status: Abnormal   Collection Time: 10/11/20  7:48 AM  Result Value Ref Range   Glucose-Capillary 152 (H) 70 - 99 mg/dL    Comment: Glucose reference range applies only to samples taken after fasting for at least 8 hours.   Comment 1 Notify RN   Glucose, capillary     Status: Abnormal   Collection Time: 10/11/20 12:33 PM  Result Value Ref Range   Glucose-Capillary 154 (H) 70 - 99 mg/dL    Comment: Glucose reference range applies only to samples taken after fasting for at least 8 hours.  Hepatitis B surface antigen     Status: None   Collection Time: 10/11/20  2:49 PM  Result Value Ref Range   Hepatitis B Surface Ag NON REACTIVE NON REACTIVE    Comment: Performed at [facility] [address] [place], [place] [address]  Hepatitis B surface antibody     Status: None   Collection Time: 10/11/20  2:49 PM  Result Value Ref Range   Hep B S Ab NON REACTIVE NON REACTIVE    Comment: (NOTE) Inconsistent with immunity, less than 10 mIU/mL.  Performed at [facility], [place] [address] [place], [place] [address]   Hepatitis B core antibody, total     Status: None   Collection Time: 10/11/20  2:49 PM  Result Value Ref Range   Hep B Core Total Ab NON REACTIVE NON REACTIVE    Comment: Performed at  [facility] [address], [place], [place] [address]  Glucose, capillary     Status: None   Collection Time: 10/11/20  5:21 PM  Result Value Ref Range   Glucose-Capillary 98 70 - 99 mg/dL    Comment: Glucose reference range applies only to samples taken after fasting for at least 8 hours.  MRSA PCR Screening     Status: None   Collection Time: 10/11/20  9:38 PM   Specimen: Nasal Mucosa; Nasopharyngeal  Result Value Ref Range   MRSA by PCR NEGATIVE NEGATIVE    Comment:        The GeneXpert MRSA Assay (FDA approved for NASAL specimens only), is one component of a comprehensive MRSA colonization surveillance program. It is not intended to diagnose MRSA infection nor to guide or monitor treatment for MRSA infections. Performed at [facility], [place] [address] [place], [place] [address]   Glucose, capillary     Status: Abnormal   Collection Time: 10/11/20  9:52 PM  Result Value Ref Range   Glucose-Capillary 194 (H) 70 - 99 mg/dL    Comment: Glucose reference range applies only to samples taken after fasting for at least 8 hours.  Basic metabolic panel     Status: Abnormal   Collection Time: 10/12/20  2:12 AM  Result Value Ref Range   Sodium 136 135 - 145 mmol/L   Potassium 3.8 3.5 - 5.1 mmol/L   Chloride 102 98 - 111 mmol/L   CO2 26 22 - 32 mmol/L   Glucose, Bld 143 (H) 70 - 99 mg/dL    Comment: Glucose  reference range applies only to samples taken after fasting for at least 8 hours.   BUN 88 (H) 8 - 23 mg/dL   Creatinine, Ser 3.83 (H) 0.44 - 1.00 mg/dL   Calcium 8.9 8.9 - 10.3 mg/dL   GFR, Estimated 12 (L) >60 mL/min    Comment: (NOTE) Calculated using the CKD-EPI Creatinine Equation (2021)    Anion gap 8 5 - 15    Comment: Performed at [facility] [address], [place], [place] [address]  CBC     Status: Abnormal   Collection Time: 10/12/20  2:12 AM  Result Value Ref Range   WBC 7.4 4.0 - 10.5 K/uL    Comment: WHITE COUNT CONFIRMED ON SMEAR    RBC 2.27 (L) 3.87 - 5.11 MIL/uL   Hemoglobin 7.2 (L) 12.0 - 15.0 g/dL   HCT 21.0 (L) 36.0 - 46.0 %   MCV 92.5 80.0 - 100.0 fL   MCH 31.7 26.0 - 34.0 pg   MCHC 34.3 30.0 - 36.0 g/dL   RDW 16.3 (H) 11.5 - 15.5 %   Platelets 175 150 - 400 K/uL   nRBC 3.9 (H) 0.0 - 0.2 %    Comment: Performed at [facility], [place] [address], [place], [place] [address]  Glucose, capillary     Status: Abnormal   Collection Time: 10/12/20  7:28 AM  Result Value Ref Range   Glucose-Capillary 174 (H) 70 - 99 mg/dL    Comment: Glucose reference range applies only to samples taken after fasting for at least 8 hours.   *Note: Due to a large number of results and/or encounters for the requested time period, some results have not been displayed. A complete set of results can be found in Results Review.    IR Fluoro Guide CV Line Right  Result Date: 10/11/2020 INDICATION: 65 year old female requiring initiation of hemodialysis due to malfunctioning upper extremity fistula. EXAM: TUNNELED CENTRAL VENOUS HEMODIALYSIS CATHETER PLACEMENT WITH ULTRASOUND AND FLUOROSCOPIC GUIDANCE MEDICATIONS: Vancomycin 1 gm IV . The antibiotic was given in an appropriate time interval prior to skin puncture. ANESTHESIA/SEDATION: Moderate (conscious) sedation was employed during this procedure. A total of Versed 1 mg and Fentanyl 25 mcg was administered intravenously. Moderate Sedation Time: 10 minutes. The patient's level of consciousness and vital signs were monitored continuously by radiology nursing throughout the procedure under my direct supervision. FLUOROSCOPY TIME:  0 minutes 6 seconds (1 mGy). COMPLICATIONS: None immediate. PROCEDURE: Informed written consent was obtained from the patient after a discussion of the risks, benefits, and alternatives to treatment. Questions regarding the procedure were encouraged and answered. The right neck and chest were prepped with chlorhexidine in a sterile fashion, and a sterile drape was applied  covering the operative field. Maximum barrier sterile technique with sterile gowns and gloves were used for the procedure. A timeout was performed prior to the initiation of the procedure. After creating a small venotomy incision, a 21 gauge micropuncture kit was utilized to access the internal jugular vein. Real-time ultrasound guidance was utilized for vascular access including the acquisition of a permanent ultrasound image documenting patency of the accessed vessel. A Wholey wire was advanced to the level of the IVC and the micropuncture sheath was exchanged for an 8 Fr dilator. A 14.5 French tunneled hemodialysis catheter measuring 23 cm from tip to cuff was tunneled in a retrograde fashion from the anterior chest wall to the venotomy incision. Serial dilation was then performed an a peel-away sheath was placed.  The catheter was then placed through the peel-away sheath with the catheter tip ultimately positioned within the right atrium. Final catheter positioning was confirmed and documented with a spot radiographic image. The catheter aspirates and flushes normally. The catheter was flushed with appropriate volume heparin dwells. The catheter exit site was secured with a 0-Prolene retention suture. The venotomy incision was closed with Dermabond. Sterile dressings were applied. The patient tolerated the procedure well without immediate post procedural complication. IMPRESSION: Successful placement of 23 cm tip to cuff tunneled hemodialysis catheter via the right internal jugular vein with catheter tip terminating within the right atrium. The catheter is ready for immediate use. Ruthann Cancer, MD [facility] Radiology Electronically Signed   By: Ruthann Cancer MD   On: 10/11/2020 12:35   IR US Guide Vasc Access Right  Result Date: 10/11/2020 INDICATION: 65 year old female requiring initiation of hemodialysis due to malfunctioning upper extremity fistula. EXAM:  TUNNELED CENTRAL VENOUS HEMODIALYSIS CATHETER PLACEMENT WITH ULTRASOUND AND FLUOROSCOPIC GUIDANCE MEDICATIONS: Vancomycin 1 gm IV . The antibiotic was given in an appropriate time interval prior to skin puncture. ANESTHESIA/SEDATION: Moderate (conscious) sedation was employed during this procedure. A total of Versed 1 mg and Fentanyl 25 mcg was administered intravenously. Moderate Sedation Time: 10 minutes. The patient's level of consciousness and vital signs were monitored continuously by radiology nursing throughout the procedure under my direct supervision. FLUOROSCOPY TIME:  0 minutes 6 seconds (1 mGy). COMPLICATIONS: None immediate. PROCEDURE: Informed written consent was obtained from the patient after a discussion of the risks, benefits, and alternatives to treatment. Questions regarding the procedure were encouraged and answered. The right neck and chest were prepped with chlorhexidine in a sterile fashion, and a sterile drape was applied covering the operative field. Maximum barrier sterile technique with sterile gowns and gloves were used for the procedure. A timeout was performed prior to the initiation of the procedure. After creating a small venotomy incision, a 21 gauge micropuncture kit was utilized to access the internal jugular vein. Real-time ultrasound guidance was utilized for vascular access including the acquisition of a permanent ultrasound image documenting patency of the accessed vessel. A Wholey wire was advanced to the level of the IVC and the micropuncture sheath was exchanged for an 8 Fr dilator. A 14.5 French tunneled hemodialysis catheter measuring 23 cm from tip to cuff was tunneled in a retrograde fashion from the anterior chest wall to the venotomy incision. Serial dilation was then performed an a peel-away sheath was placed. The catheter was then placed through the peel-away sheath with the catheter tip ultimately positioned within the right atrium. Final catheter positioning was  confirmed and documented with a spot radiographic image. The catheter aspirates and flushes normally. The catheter was flushed with appropriate volume heparin dwells. The catheter exit site was secured with a 0-Prolene retention suture. The venotomy incision was closed with Dermabond. Sterile dressings were applied. The patient tolerated the procedure well without immediate post procedural complication. IMPRESSION: Successful placement of 23 cm tip to cuff tunneled hemodialysis catheter via the right internal jugular vein with catheter tip terminating within the right atrium. The catheter is ready for immediate use. Ruthann Cancer, MD [facility] Radiology Electronically Signed   By: Ruthann Cancer MD   On: 10/11/2020 12:35    EOF:[id number] except as listed in admit H&P  Blood pressure (!) 138/57, pulse (!) 58, temperature 97.6 F (36.4 C), temperature source Oral, resp. rate 18, height '5\' 4"'  (  1.626 m), weight 92.4 kg, SpO2 100 %.  PHYSICAL EXAM: Overall appearance:  Healthy appearing, in no distress.  There is no active bleeding. Head:  Normocephalic, atraumatic. Ears: External ears look healthy. Nose: External nose is healthy in appearance. Internal nasal exam free of any lesions or obstruction. Oral Cavity/Pharynx:  There are no mucosal lesions or masses identified. Larynx/Hypopharynx: Deferred Neuro:  No identifiable neurologic deficits. Neck: No palpable neck masses.  Studies Reviewed: none  Procedures: Treatment of epistaxis.  The nasal cavities were thoroughly inspected and there is no obvious ulceration, granuloma, exposed vessel seen on either side.  There was no active bleeding and her pharynx was clear of any old or fresh blood.  The left nasal cavity was topically anesthetized with Afrin/Xylocaine spray and on cotton pledgets.  1% Xylocaine with epinephrine was then infiltrated into the left side of the septum and the inferior turbinate as  well as the floor of the nose for anesthesia.  A long Merocel pack was placed.  The Merisel was trimmed down a few millimeters in thickness and also about 1 cm in length.  It was then inflated with the local anesthetic solution.  She tolerated this well.   Assessment/Plan: Chronic epistaxis, left side.  Packing placed.  Will monitor for the next 5 days.  If there is no bleeding in 5 days then the packing will be removed.  This can be done in the hospital or in the office if she is able to be discharged home.  If there is additional bleeding then I will be available to reconsult.  Izora Gala 10/12/2020, 9:58 AM    Diagnosis: Epistaxis R04.0 Procedure: [id number], [phone]

## 2020-10-12 NOTE — Progress Notes (Addendum)
Advanced Heart Failure Rounding Note  PCP: Darleen Crocker Primary Cardiologist:   Subjective:    TDC placed yesterday had first HD session 1L UF removed bp tolerated.  Epistaxis episode this am which is packed currently.  She says she tolerated HD well, she denies any current dizziness, light headedness, dyspnea or chest pain.     Objective:   Weight Range: 92.4 kg Body mass index is 34.98 kg/m.   Vital Signs:   Temp:  [97.6 F (36.4 C)-99 F (37.2 C)] 97.6 F (36.4 C) (03/31 0748) Pulse Rate:  [58-66] 58 (03/31 0748) Resp:  [11-19] 18 (03/31 0748) BP: (119-157)/(32-75) 138/57 (03/31 0748) SpO2:  [96 %-100 %] 100 % (03/31 0748) Weight:  [92.4 kg-93.8 kg] 92.4 kg (03/31 0444) Last BM Date: 10/08/20  Weight change: Filed Weights   10/11/20 1423 10/11/20 1700 10/12/20 0444  Weight: 93.8 kg 92.8 kg 92.4 kg    Intake/Output:   Intake/Output Summary (Last 24 hours) at 10/12/2020 0749 Last data filed at 10/11/2020 1700 Gross per 24 hour  Intake 200 ml  Output 1250 ml  Net -1050 ml      Physical Exam    Cardiac: JVP to jaw, normal rate and rhythm, clear s1 and s2, 3/6 AS murmur high pitched with diminished S2, no rubs or gallops, bilateral LE edema non pitting Pulmonary: decreased bases with bilateral rales, not in distress Abdominal: non distended abdomen, soft and nontender Nares: right nare packed blood around packing  Psych: Alert, conversant, in good spirits    Telemetry   NSR rates 60-70's  EKG    No new ecg  Labs    CBC Recent Labs    10/09/20 1540 10/10/20 0939 10/11/20 0357 10/12/20 0212  WBC 6.7 8.0 8.8 7.4  NEUTROABS 5.5 6.6  --   --   HGB 4.6* 8.3* 7.3* 7.2*  HCT 14.2* 23.8* 20.9* 21.0*  MCV 97.9 90.2 89.7 92.5  PLT 196 178 191 431   Basic Metabolic Panel Recent Labs    10/11/20 0357 10/12/20 0212  NA 135 136  K 4.2 3.8  CL 100 102  CO2 24 26  GLUCOSE 161* 143*  BUN 163* 88*  CREATININE 5.15* 3.83*  CALCIUM 9.5 8.9    Liver Function Tests No results for input(s): AST, ALT, ALKPHOS, BILITOT, PROT, ALBUMIN in the last 72 hours. No results for input(s): LIPASE, AMYLASE in the last 72 hours. Cardiac Enzymes No results for input(s): CKTOTAL, CKMB, CKMBINDEX, TROPONINI in the last 72 hours.  BNP: BNP (last 3 results) Recent Labs    06/07/20 0826 07/06/20 1733  BNP 489.2* 932.6*    ProBNP (last 3 results) No results for input(s): PROBNP in the last 8760 hours.   D-Dimer No results for input(s): DDIMER in the last 72 hours. Hemoglobin A1C No results for input(s): HGBA1C in the last 72 hours. Fasting Lipid Panel No results for input(s): CHOL, HDL, LDLCALC, TRIG, CHOLHDL, LDLDIRECT in the last 72 hours. Thyroid Function Tests No results for input(s): TSH, T4TOTAL, T3FREE, THYROIDAB in the last 72 hours.  Invalid input(s): FREET3  Other results:   Imaging    IR Fluoro Guide CV Line Right  Result Date: 10/11/2020 INDICATION: 65 year old female requiring initiation of hemodialysis due to malfunctioning upper extremity fistula. EXAM: TUNNELED CENTRAL VENOUS HEMODIALYSIS CATHETER PLACEMENT WITH ULTRASOUND AND FLUOROSCOPIC GUIDANCE MEDICATIONS: Vancomycin 1 gm IV . The antibiotic was given in an appropriate time interval prior to skin puncture. ANESTHESIA/SEDATION: Moderate (conscious) sedation was employed during  this procedure. A total of Versed 1 mg and Fentanyl 25 mcg was administered intravenously. Moderate Sedation Time: 10 minutes. The patient's level of consciousness and vital signs were monitored continuously by radiology nursing throughout the procedure under my direct supervision. FLUOROSCOPY TIME:  0 minutes 6 seconds (1 mGy). COMPLICATIONS: None immediate. PROCEDURE: Informed written consent was obtained from the patient after a discussion of the risks, benefits, and alternatives to treatment. Questions regarding the procedure were encouraged and answered. The right neck and chest were  prepped with chlorhexidine in a sterile fashion, and a sterile drape was applied covering the operative field. Maximum barrier sterile technique with sterile gowns and gloves were used for the procedure. A timeout was performed prior to the initiation of the procedure. After creating a small venotomy incision, a 21 gauge micropuncture kit was utilized to access the internal jugular vein. Real-time ultrasound guidance was utilized for vascular access including the acquisition of a permanent ultrasound image documenting patency of the accessed vessel. A Wholey wire was advanced to the level of the IVC and the micropuncture sheath was exchanged for an 8 Fr dilator. A 14.5 French tunneled hemodialysis catheter measuring 23 cm from tip to cuff was tunneled in a retrograde fashion from the anterior chest wall to the venotomy incision. Serial dilation was then performed an a peel-away sheath was placed. The catheter was then placed through the peel-away sheath with the catheter tip ultimately positioned within the right atrium. Final catheter positioning was confirmed and documented with a spot radiographic image. The catheter aspirates and flushes normally. The catheter was flushed with appropriate volume heparin dwells. The catheter exit site was secured with a 0-Prolene retention suture. The venotomy incision was closed with Dermabond. Sterile dressings were applied. The patient tolerated the procedure well without immediate post procedural complication. IMPRESSION: Successful placement of 23 cm tip to cuff tunneled hemodialysis catheter via the right internal jugular vein with catheter tip terminating within the right atrium. The catheter is ready for immediate use. Ruthann Cancer, MD [facility] Radiology Electronically Signed   By: Ruthann Cancer MD   On: 10/11/2020 12:35   IR US Guide Vasc Access Right  Result Date: 10/11/2020 INDICATION: 65 year old female requiring  initiation of hemodialysis due to malfunctioning upper extremity fistula. EXAM: TUNNELED CENTRAL VENOUS HEMODIALYSIS CATHETER PLACEMENT WITH ULTRASOUND AND FLUOROSCOPIC GUIDANCE MEDICATIONS: Vancomycin 1 gm IV . The antibiotic was given in an appropriate time interval prior to skin puncture. ANESTHESIA/SEDATION: Moderate (conscious) sedation was employed during this procedure. A total of Versed 1 mg and Fentanyl 25 mcg was administered intravenously. Moderate Sedation Time: 10 minutes. The patient's level of consciousness and vital signs were monitored continuously by radiology nursing throughout the procedure under my direct supervision. FLUOROSCOPY TIME:  0 minutes 6 seconds (1 mGy). COMPLICATIONS: None immediate. PROCEDURE: Informed written consent was obtained from the patient after a discussion of the risks, benefits, and alternatives to treatment. Questions regarding the procedure were encouraged and answered. The right neck and chest were prepped with chlorhexidine in a sterile fashion, and a sterile drape was applied covering the operative field. Maximum barrier sterile technique with sterile gowns and gloves were used for the procedure. A timeout was performed prior to the initiation of the procedure. After creating a small venotomy incision, a 21 gauge micropuncture kit was utilized to access the internal jugular vein. Real-time ultrasound guidance was utilized for vascular access including the acquisition of a permanent ultrasound image documenting patency of the  accessed vessel. A Wholey wire was advanced to the level of the IVC and the micropuncture sheath was exchanged for an 8 Fr dilator. A 14.5 French tunneled hemodialysis catheter measuring 23 cm from tip to cuff was tunneled in a retrograde fashion from the anterior chest wall to the venotomy incision. Serial dilation was then performed an a peel-away sheath was placed. The catheter was then placed through the peel-away sheath with the catheter tip  ultimately positioned within the right atrium. Final catheter positioning was confirmed and documented with a spot radiographic image. The catheter aspirates and flushes normally. The catheter was flushed with appropriate volume heparin dwells. The catheter exit site was secured with a 0-Prolene retention suture. The venotomy incision was closed with Dermabond. Sterile dressings were applied. The patient tolerated the procedure well without immediate post procedural complication. IMPRESSION: Successful placement of 23 cm tip to cuff tunneled hemodialysis catheter via the right internal jugular vein with catheter tip terminating within the right atrium. The catheter is ready for immediate use. Ruthann Cancer, MD [facility] Radiology Electronically Signed   By: Ruthann Cancer MD   On: 10/11/2020 12:35     Medications:     Scheduled Medications: . allopurinol  100 mg Oral Daily  . amiodarone  200 mg Oral Daily  . aspirin  81 mg Oral Pre-Cath  . atorvastatin  40 mg Oral Daily  . carvedilol  6.25 mg Oral BID  . Chlorhexidine Gluconate Cloth  6 each Topical Q0600  . cloNIDine  0.1 mg Oral BID  . fluticasone  1 spray Each Nare Daily  . furosemide  40 mg Oral Daily  . hydrALAZINE  100 mg Oral TID  . insulin aspart  0-9 Units Subcutaneous TID WC  . insulin glargine  20 Units Subcutaneous Daily  . latanoprost  1 drop Both Eyes QHS  . levothyroxine  150 mcg Oral QAC breakfast  . linagliptin  5 mg Oral Daily  . loratadine  10 mg Oral QODAY  . pantoprazole  40 mg Oral Daily  . sildenafil  20 mg Oral TID  . sodium bicarbonate  650 mg Oral BID  . sodium chloride flush  3 mL Intravenous Q12H    Infusions: . sodium chloride    . sodium chloride      PRN Medications: sodium chloride, acetaminophen, diphenhydrAMINE, HYDROmorphone (DILAUDID) injection, nitroGLYCERIN, ondansetron (ZOFRAN) IV, oxyCODONE-acetaminophen, sodium chloride, sodium chloride  flush    Patient Profile   Jeri Jeanbaptiste is a 65 y.o. 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 Karle Starch.  Assessment/Plan   Chest pain with elevated troponin and ST depression on EKG -elevated troponin highest value 7,352 last night may not have peaked, ecg with global ischemia in the setting of markedly low hgb 4.9.  No CAD by [facility] 2019 however  CAD by HRCT scan on 02/2020, no RWMA on echo this admission.  -s/p 3 u PRBC's transfusion 10/11/20 with resolution of ST depression on ecg and symptoms -R/LHC scheduled for Friday   Severe aortic stenosis and moderate mitral stenosis - 09/2020 AVA 0.71, mean gradient 65 compared  with 0.84 and 66 on 05/2020 - MS moderate mean gradient is 7, MVA 1.5 - This will make dialysis difficult, needs L/RHC in the next day or two - Options are BAV to TAVR until AVF matures vs continue HD in her current state until AVF matures then TAVR currently undergoing workup   PAH - suspect multifactorial in nature suspect including high output due to anemia, WHO group II (diastolic HF and valvular disease) and III (OSA) but given degree of PH cannot exclude WHO Group I component. - [facility] 2019 with severe PAH; repeat [place] 04/2019 with improved PA pressures - prior VQ negative, PFTs 8/20 with normal spirometry and moderately reduced DLCO. - sleep study still pending - on sildenafil, continue  CKD stage V - nephrology following had TDC placement and first HD session 10/11/2020   Paroxysmal atrial fibrillation - maintaining NSR on home amiodarone - not on [place] due to  transfusion dependent anemia  Chronic diastolic CHF - EF normal but with severe AS, MS and G2DD - Markedly volume overloaded on exam but breathing okay - BP okay on home meds may need to adjust with dialysis but okay so far - nephrology managing UF removal, bp okay with first session  MDS -Diagnosed in 2017 treated with 5-azacytidine finished 7 cycles of therapy completed in 2017.  -Currently only on a hematopoiesis stimulator luspatercept , last oncology note says any further treatments would be palliative  Chronic blood loss anemia/anemia of chronic kidney disease and MDS: -recurrent epistaxis uses prn afrin had ENT evaluation in the past.  She gets bleeding in bilateral nares L>R -had noticeable episodes as recent as 3-4 days prior to admission and now on 3/31 am -hgb stable but epistaxis occurred after this was drawn she is likely low will need another unit this am -needs definitive management by ENT will consult today  Length of Stay: [place], MD  10/12/2020, 7:49 AM  Advanced Heart Failure Team Pager [phone] (M-F; [place])  Please contact [facility] for night-coverage after hours (4p -7a ) and weekends on [email or web address]  Patient seen and examined with the above-signed Advanced Practice Provider and/or Housestaff. I personally reviewed laboratory data, imaging studies and relevant notes. I independently examined the patient and formulated the important aspects of the plan. I have edited the note to reflect any of my changes or salient points. I have personally discussed the plan with the patient and/or family.  TDC placed. Tolerated HD #1 well yesterday. Developed recurrent epistaxis today. I d/w Dr. Constance Holster (ETN) who packed her left nare today (thanks!).   Denies CP or SOB. BP stable. Hgb 7.2 For HD #2 today  General:  Lying in bed No resp difficulty HEENT: normal left nare packed Neck: supple. jvp 10 . Carotids 2+ bilat; + bruits. No lymphadenopathy or  thryomegaly appreciated. Cor: PMI nondisplaced. Regular rate & rhythm. 3/6 AS. + HD cath Lungs: clear Abdomen: soft, nontender, nondistended. No hepatosplenomegaly. No bruits or masses. Good bowel sounds. Extremities: no cyanosis, clubbing, rash, 1+ edema Neuro: alert & orientedx3, cranial nerves grossly intact. moves all 4 extremities w/o difficulty. Affect pleasant  Very complicated situation. Fortunately tolerating HD well so far. Appreciate ENT packing her nose. Will likely need abx while backing in.   Would consider transfusing 1 more unit RBCs today.  TAVR CTs planned for today.   Will plan R/L cath (femoral approach tomorrow).   If develops problems tolerating HD. Temporizing BAV could be considered until AVF  matures.   D/w Structural Team.   Glori Bickers, MD  1:01 PM

## 2020-10-12 NOTE — Progress Notes (Signed)
Pt.called & c/o nosebleeding .Advised to apply pressure on the bridge of her nose..Will continue to observe pt.

## 2020-10-12 NOTE — Progress Notes (Signed)
[facility] Progress Note   Assessment/Plan  Patricia Mata is an 65 y.o. female with CKD 5, HTN, AS (severe), mitral stenosis (mod/severe), gout, pulmonary HTN, DM, MDS, gout who presented with chest pain, found to have severe anemia in the 4s and is seen for AKI on CKD.   **Chest pain, severe AS: thought to be demand ischemia in setting of severe anemia and AS and now resolved.  Cardiology is following and plan LHC tomorrow.  Notes indicate BAV as bridge to TAVR.  TTE with trivial effusion.  No signs of pericarditis on EKG and no rub.   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  With volume overload and I suspect uremic platelet dysfunction leading to the nose bleeds and life threatening anemia we proceeded to initiate HD.  TDC and 1st dialysis 10/11/20; tolerated well.  2nd HD today.  Given her AS she may have poor tolerance of HD but we will attempt to manage.  Cardiology is discussing timing of valve replacement. CLIP.   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  Transfuse pRBC PRN Hb < 7.  I think part of the issue lately has been epistaxis which may be exacerbated by uremic platelet dysfunction. ENT being consulted today for epistaxis.   **Metabolic acidosis: stop po supplements now that HD on.  **HTN:  Cont home medications. Well controlled currently.   **Pulmonary HTN: on sildenafil.   **A fibrillation, paroxysmal:  On amiodarone.    Will follow, call with clinical status changes.   Subjective/Interval Events:   No new issues, feeling ok this AM.  Had epistaxis 6:30-8:30 this am - ENT being consulted.  Tolerated HD fine yesterday, 1st treatment (short, low BFR); 2nd planned for today.   Objective Vitals:   10/11/20 2145 10/12/20 0030 10/12/20 0444 10/12/20 0748  BP: (!) 127/53 (!) 153/63 119/75 (!) 138/57  Pulse:  62 60 (!) 58   Resp:  _0 Temp:  98.8 F (37.1 C) 99 F (37.2 C) 97.6 F (36.4 C)  TempSrc:  Oral Oral Oral  SpO2:  98% 100% 100%  Weight:   92.4 kg   Height:       Physical Exam Gen: awake and alert, looks comfortable in bed Eyes: anicteric ENT: MMM Neck: supple CV:  RRR, IV/VI SEM, no rub Abd:  soft Lungs: normal WOB on 2L [place], dec BS bases GU: no foley Extr:  No edema, RUE +t/b, not mature Neuro: no asterixis, conversant Skin: no rashes  Additional Objective Labs: Basic Metabolic Panel: Recent Labs  Lab 10/10/20 0939 10/11/20 0357 10/12/20 0212  NA 134* 135 136  K 5.2* 4.2 3.8  CL 99 100 102  CO2 _1 GLUCOSE 160* 161* 143*  BUN 153* 163* 88*  CREATININE 4.78* 5.15* 3.83*  CALCIUM 9.5 9.5 8.9   Liver Function Tests: Recent Labs  Lab 10/09/20 0554  AST 113*  ALT 75*  ALKPHOS 110  BILITOT 1.0  PROT 6.0*  ALBUMIN 3.1*   Recent Labs  Lab 10/09/20 0554  LIPASE 32   CBC: Recent Labs  Lab 10/09/20 1540 10/10/20 0939 10/11/20 0357 10/12/20 0212  WBC 6.7 8.0 8.8 7.4  NEUTROABS 5.5 6.6  --   --   HGB 4.6* 8.3* 7.3* 7.2*  HCT 14.2* 23.8* 20.9* 21.0*  MCV 97.9 90.2 89.7 92.5  PLT  196 178 191 175   Blood Culture    Component Value Date/Time   SDES  09/24/2019 1144    TRACHEAL ASPIRATE Performed at [facility], [place] [address] [place], [place] [address]    SPECREQUEST  09/24/2019 1144    NONE Performed at [facility], [place] [address] [place], [place] [address]    CULT FEW C[facility]IDA ALBICANS 09/24/2019 1144   REPTSTATUS 09/27/2019 FINAL 09/24/2019 1144    Cardiac Enzymes: No results for input(s): CKTOTAL, CKMB, CKMBINDEX, TROPONINI in the last 168 hours. CBG: Recent Labs  Lab 10/11/20 0748 10/11/20 1233 10/11/20 1721 10/11/20 2152 10/12/20 0728  GLUCAP 152* 154* 98 194* 174*   Iron Studies: No results for input(s): IRON, TIBC, TRANSFERRIN, FERRITIN in the last 72  hours. _0 @ Studies/Results: IR Fluoro Guide CV Line Right  Result Date: 10/11/2020 INDICATION: 65 year old female requiring initiation of hemodialysis due to malfunctioning upper extremity fistula. EXAM: TUNNELED CENTRAL VENOUS HEMODIALYSIS CATHETER PLACEMENT WITH ULTRASOUND AND FLUOROSCOPIC GUIDANCE MEDICATIONS: Vancomycin 1 gm IV . The antibiotic was given in an appropriate time interval prior to skin puncture. ANESTHESIA/SEDATION: Moderate (conscious) sedation was employed during this procedure. A total of Versed 1 mg and Fentanyl 25 mcg was administered intravenously. Moderate Sedation Time: 10 minutes. The patient's level of consciousness and vital signs were monitored continuously by radiology nursing throughout the procedure under my direct supervision. FLUOROSCOPY TIME:  0 minutes 6 seconds (1 mGy). COMPLICATIONS: None immediate. PROCEDURE: Informed written consent was obtained from the patient after a discussion of the risks, benefits, and alternatives to treatment. Questions regarding the procedure were encouraged and answered. The right neck and chest were prepped with chlorhexidine in a sterile fashion, and a sterile drape was applied covering the operative field. Maximum barrier sterile technique with sterile gowns and gloves were used for the procedure. A timeout was performed prior to the initiation of the procedure. After creating a small venotomy incision, a 21 gauge micropuncture kit was utilized to access the internal jugular vein. Real-time ultrasound guidance was utilized for vascular access including the acquisition of a permanent ultrasound image documenting patency of the accessed vessel. A Wholey wire was advanced to the level of the IVC and the micropuncture sheath was exchanged for an 8 Fr dilator. A 14.5 French tunneled hemodialysis catheter measuring 23 cm from tip to cuff was tunneled in a retrograde fashion from the anterior chest wall to the venotomy incision. Serial  dilation was then performed an a peel-away sheath was placed. The catheter was then placed through the peel-away sheath with the catheter tip ultimately positioned within the right atrium. Final catheter positioning was confirmed and documented with a spot radiographic image. The catheter aspirates and flushes normally. The catheter was flushed with appropriate volume heparin dwells. The catheter exit site was secured with a 0-Prolene retention suture. The venotomy incision was closed with Dermabond. Sterile dressings were applied. The patient tolerated the procedure well without immediate post procedural complication. IMPRESSION: Successful placement of 23 cm tip to cuff tunneled hemodialysis catheter via the right internal jugular vein with catheter tip terminating within the right atrium. The catheter is ready for immediate use. Ruthann Cancer, MD [facility] Radiology Electronically Signed   By: Ruthann Cancer MD   On: 10/11/2020 12:35   IR US Guide Vasc Access Right  Result Date: 10/11/2020 INDICATION: 64 year old female requiring initiation of hemodialysis due to malfunctioning upper extremity fistula. EXAM: TUNNELED CENTRAL VENOUS HEMODIALYSIS CATHETER PLACEMENT WITH  ULTRASOUND AND FLUOROSCOPIC GUIDANCE MEDICATIONS: Vancomycin 1 gm IV . The antibiotic was given in an appropriate time interval prior to skin puncture. ANESTHESIA/SEDATION: Moderate (conscious) sedation was employed during this procedure. A total of Versed 1 mg and Fentanyl 25 mcg was administered intravenously. Moderate Sedation Time: 10 minutes. The patient's level of consciousness and vital signs were monitored continuously by radiology nursing throughout the procedure under my direct supervision. FLUOROSCOPY TIME:  0 minutes 6 seconds (1 mGy). COMPLICATIONS: None immediate. PROCEDURE: Informed written consent was obtained from the patient after a discussion of the risks, benefits, and  alternatives to treatment. Questions regarding the procedure were encouraged and answered. The right neck and chest were prepped with chlorhexidine in a sterile fashion, and a sterile drape was applied covering the operative field. Maximum barrier sterile technique with sterile gowns and gloves were used for the procedure. A timeout was performed prior to the initiation of the procedure. After creating a small venotomy incision, a 21 gauge micropuncture kit was utilized to access the internal jugular vein. Real-time ultrasound guidance was utilized for vascular access including the acquisition of a permanent ultrasound image documenting patency of the accessed vessel. A Wholey wire was advanced to the level of the IVC and the micropuncture sheath was exchanged for an 8 Fr dilator. A 14.5 French tunneled hemodialysis catheter measuring 23 cm from tip to cuff was tunneled in a retrograde fashion from the anterior chest wall to the venotomy incision. Serial dilation was then performed an a peel-away sheath was placed. The catheter was then placed through the peel-away sheath with the catheter tip ultimately positioned within the right atrium. Final catheter positioning was confirmed and documented with a spot radiographic image. The catheter aspirates and flushes normally. The catheter was flushed with appropriate volume heparin dwells. The catheter exit site was secured with a 0-Prolene retention suture. The venotomy incision was closed with Dermabond. Sterile dressings were applied. The patient tolerated the procedure well without immediate post procedural complication. IMPRESSION: Successful placement of 23 cm tip to cuff tunneled hemodialysis catheter via the right internal jugular vein with catheter tip terminating within the right atrium. The catheter is ready for immediate use. Ruthann Cancer, MD [facility] Radiology Electronically Signed   By: Ruthann Cancer MD    On: 10/11/2020 12:35   Medications: . sodium chloride    . sodium chloride     . allopurinol  100 mg Oral Daily  . amiodarone  200 mg Oral Daily  . aspirin  81 mg Oral Pre-Cath  . atorvastatin  40 mg Oral Daily  . carvedilol  6.25 mg Oral BID  . Chlorhexidine Gluconate Cloth  6 each Topical Q0600  . cloNIDine  0.1 mg Oral BID  . fluticasone  1 spray Each Nare Daily  . furosemide  40 mg Oral Daily  . hydrALAZINE  100 mg Oral TID  . insulin aspart  0-9 Units Subcutaneous TID WC  . insulin glargine  20 Units Subcutaneous Daily  . latanoprost  1 drop Both Eyes QHS  . levothyroxine  150 mcg Oral QAC breakfast  . linagliptin  5 mg Oral Daily  . loratadine  10 mg Oral QODAY  . pantoprazole  40 mg Oral Daily  . sildenafil  20 mg Oral TID  . sodium bicarbonate  650 mg Oral BID  . sodium chloride flush  3 mL Intravenous Q12H        Jannifer Hick MD 10/12/2020, 9:01 AM  [facility]

## 2020-10-12 NOTE — Care Management Important Message (Signed)
Important Message  Patient Details  Name: Patricia Mata MRN: [id number] Date of Birth: Dec 16, 1955   Medicare Important Message Given:  Yes     Shelda Altes 10/12/2020, 4:01 PM

## 2020-10-13 ENCOUNTER — Inpatient Hospital Stay: Payer: Medicare Other

## 2020-10-13 ENCOUNTER — Inpatient Hospital Stay: Payer: Medicare Other | Attending: Oncology

## 2020-10-13 ENCOUNTER — Encounter (HOSPITAL_COMMUNITY): Payer: Self-pay | Admitting: Internal Medicine

## 2020-10-13 ENCOUNTER — Inpatient Hospital Stay (HOSPITAL_COMMUNITY)
Admission: EM | Disposition: A | Discharge status: Home / Self Care | Payer: Self-pay | Source: Home / Self Care | Attending: Internal Medicine

## 2020-10-13 DIAGNOSIS — D631 Anemia in chronic kidney disease: Secondary | ICD-10-CM | POA: Insufficient documentation

## 2020-10-13 DIAGNOSIS — I350 Nonrheumatic aortic (valve) stenosis: Secondary | ICD-10-CM | POA: Diagnosis not present

## 2020-10-13 DIAGNOSIS — N189 Chronic kidney disease, unspecified: Secondary | ICD-10-CM | POA: Insufficient documentation

## 2020-10-13 DIAGNOSIS — D649 Anemia, unspecified: Secondary | ICD-10-CM | POA: Diagnosis not present

## 2020-10-13 DIAGNOSIS — I214 Non-ST elevation (NSTEMI) myocardial infarction: Secondary | ICD-10-CM | POA: Diagnosis not present

## 2020-10-13 DIAGNOSIS — I2510 Atherosclerotic heart disease of native coronary artery without angina pectoris: Secondary | ICD-10-CM

## 2020-10-13 DIAGNOSIS — D469 Myelodysplastic syndrome, unspecified: Secondary | ICD-10-CM | POA: Insufficient documentation

## 2020-10-13 HISTORY — PX: RIGHT/LEFT HEART CATH AND CORONARY ANGIOGRAPHY: CATH118266

## 2020-10-13 LAB — CBC
HCT: 20.9 % — ABNORMAL LOW (ref 36.0–46.0)
Hemoglobin: 7.3 g/dL — ABNORMAL LOW (ref 12.0–15.0)
MCH: 31.9 pg (ref 26.0–34.0)
MCHC: 34.9 g/dL (ref 30.0–36.0)
MCV: 91.3 fL (ref 80.0–100.0)
Platelets: 165 10*3/uL (ref 150–400)
RBC: 2.29 MIL/uL — ABNORMAL LOW (ref 3.87–5.11)
RDW: 16.2 % — ABNORMAL HIGH (ref 11.5–15.5)
WBC: 6 10*3/uL (ref 4.0–10.5)
nRBC: 3.9 % — ABNORMAL HIGH (ref 0.0–0.2)

## 2020-10-13 LAB — POCT I-STAT EG7
Acid-Base Excess: 4 mmol/L — ABNORMAL HIGH (ref 0.0–2.0)
Acid-Base Excess: 4 mmol/L — ABNORMAL HIGH (ref 0.0–2.0)
Bicarbonate: 28.3 mmol/L — ABNORMAL HIGH (ref 20.0–28.0)
Bicarbonate: 28.3 mmol/L — ABNORMAL HIGH (ref 20.0–28.0)
Calcium, Ion: 1.24 mmol/L (ref 1.15–1.40)
Calcium, Ion: 1.25 mmol/L (ref 1.15–1.40)
HCT: 23 % — ABNORMAL LOW (ref 36.0–46.0)
HCT: 23 % — ABNORMAL LOW (ref 36.0–46.0)
Hemoglobin: 7.8 g/dL — ABNORMAL LOW (ref 12.0–15.0)
Hemoglobin: 7.8 g/dL — ABNORMAL LOW (ref 12.0–15.0)
O2 Saturation: 67 %
O2 Saturation: 67 %
Potassium: 3.8 mmol/L (ref 3.5–5.1)
Potassium: 3.8 mmol/L (ref 3.5–5.1)
Sodium: 137 mmol/L (ref 135–145)
Sodium: 137 mmol/L (ref 135–145)
TCO2: 30 mmol/L (ref 22–32)
TCO2: 30 mmol/L (ref 22–32)
pCO2, Ven: 42.6 mmHg — ABNORMAL LOW (ref 44.0–60.0)
pCO2, Ven: 42.9 mmHg — ABNORMAL LOW (ref 44.0–60.0)
pH, Ven: 7.426 (ref 7.250–7.430)
pH, Ven: 7.43 (ref 7.250–7.430)
pO2, Ven: 34 mmHg (ref 32.0–45.0)
pO2, Ven: 34 mmHg (ref 32.0–45.0)

## 2020-10-13 LAB — POCT I-STAT 7, (LYTES, BLD GAS, ICA,H+H)
Acid-Base Excess: 1 mmol/L (ref 0.0–2.0)
Bicarbonate: 25.4 mmol/L (ref 20.0–28.0)
Calcium, Ion: 1.11 mmol/L — ABNORMAL LOW (ref 1.15–1.40)
HCT: 21 % — ABNORMAL LOW (ref 36.0–46.0)
Hemoglobin: 7.1 g/dL — ABNORMAL LOW (ref 12.0–15.0)
O2 Saturation: 96 %
Potassium: 3.5 mmol/L (ref 3.5–5.1)
Sodium: 140 mmol/L (ref 135–145)
TCO2: 26 mmol/L (ref 22–32)
pCO2 arterial: 36.2 mmHg (ref 32.0–48.0)
pH, Arterial: 7.454 — ABNORMAL HIGH (ref 7.350–7.450)
pO2, Arterial: 78 mmHg — ABNORMAL LOW (ref 83.0–108.0)

## 2020-10-13 LAB — GLUCOSE, CAPILLARY
Glucose-Capillary: 224 mg/dL — ABNORMAL HIGH (ref 70–99)
Glucose-Capillary: 114 mg/dL — ABNORMAL HIGH (ref 70–99)
Glucose-Capillary: 131 mg/dL — ABNORMAL HIGH (ref 70–99)

## 2020-10-13 SURGERY — RIGHT/LEFT HEART CATH AND CORONARY ANGIOGRAPHY
Anesthesia: LOCAL

## 2020-10-13 MED ORDER — LIDOCAINE HCL (PF) 1 % IJ SOLN
INTRAMUSCULAR | Status: DC | PRN
  Administered 2020-10-13 (×2): 2 mL via SUBCUTANEOUS

## 2020-10-13 MED ORDER — HEPARIN (PORCINE) IN NACL 1000-0.9 UT/500ML-% IV SOLN
INTRAVENOUS | Status: DC | PRN
  Administered 2020-10-13 (×2): 500 mL

## 2020-10-13 MED ORDER — MIDAZOLAM HCL 2 MG/2ML IJ SOLN
INTRAMUSCULAR | Status: AC
  Filled 2020-10-13: qty 2

## 2020-10-13 MED ORDER — FENTANYL CITRATE (PF) 100 MCG/2ML IJ SOLN
INTRAMUSCULAR | Status: DC | PRN
  Administered 2020-10-13: 25 ug via INTRAVENOUS

## 2020-10-13 MED ORDER — VERAPAMIL HCL 2.5 MG/ML IV SOLN
INTRAVENOUS | Status: AC
  Filled 2020-10-13: qty 2

## 2020-10-13 MED ORDER — IOHEXOL 350 MG/ML SOLN
INTRAVENOUS | Status: DC | PRN
  Administered 2020-10-13: 55 mL via INTRA_ARTERIAL

## 2020-10-13 MED ORDER — VERAPAMIL HCL 2.5 MG/ML IV SOLN
INTRAVENOUS | Status: DC | PRN
  Administered 2020-10-13: 10 mL via INTRA_ARTERIAL

## 2020-10-13 MED ORDER — MIDAZOLAM HCL 2 MG/2ML IJ SOLN
INTRAMUSCULAR | Status: DC | PRN
  Administered 2020-10-13 (×2): 1 mg via INTRAVENOUS

## 2020-10-13 MED ORDER — LIDOCAINE HCL (PF) 1 % IJ SOLN
INTRAMUSCULAR | Status: AC
  Filled 2020-10-13: qty 30

## 2020-10-13 MED ORDER — HEPARIN SODIUM (PORCINE) 1000 UNIT/ML IJ SOLN
INTRAMUSCULAR | Status: AC
  Filled 2020-10-13: qty 1

## 2020-10-13 MED ORDER — HEPARIN (PORCINE) IN NACL 1000-0.9 UT/500ML-% IV SOLN
INTRAVENOUS | Status: AC
  Filled 2020-10-13: qty 1500

## 2020-10-13 MED ORDER — HEPARIN SODIUM (PORCINE) 1000 UNIT/ML IJ SOLN
INTRAMUSCULAR | Status: DC | PRN
  Administered 2020-10-13: 4500 [IU] via INTRAVENOUS

## 2020-10-13 MED ORDER — FENTANYL CITRATE (PF) 100 MCG/2ML IJ SOLN
INTRAMUSCULAR | Status: AC
  Filled 2020-10-13: qty 2

## 2020-10-13 SURGICAL SUPPLY — 16 items
CATH 5FR JL3.5 JR4 ANG PIG MP (CATHETERS) ×1 IMPLANT
CATH INFINITI 4FR RCB (CATHETERS) ×1 IMPLANT
CATH LAUNCHER 5F JL3 (CATHETERS) IMPLANT
CATH SWAN GANZ 7F STRAIGHT (CATHETERS) ×1 IMPLANT
CATHETER LAUNCHER 5F JL3 (CATHETERS) ×2
DEVICE RAD COMP TR BAND LRG (VASCULAR PRODUCTS) ×1 IMPLANT
GLIDESHEATH SLEND SS 6F .021 (SHEATH) ×2 IMPLANT
GLIDESHEATH SLENDER 7FR .021G (SHEATH) ×1 IMPLANT
GUIDEWIRE .025 260CM (WIRE) ×1 IMPLANT
GUIDEWIRE INQWIRE 1.5J.035X260 (WIRE) IMPLANT
INQWIRE 1.5J .035X260CM (WIRE) ×2
PACK CARDIAC CATHETERIZATION (CUSTOM PROCEDURE TRAY) ×2 IMPLANT
TRANSDUCER W/STOPCOCK (MISCELLANEOUS) ×2 IMPLANT
TUBING ART PRESS 72  MALE/FEM (TUBING) ×2
TUBING ART PRESS 72 MALE/FEM (TUBING) IMPLANT
WIRE EMERALD ST .035X150CM (WIRE) ×2 IMPLANT

## 2020-10-13 NOTE — Progress Notes (Signed)
Patient has been accepted at [facility] for outpatient HD on a TTS schedule with a seat time of 11:15am. She needs to arrive to her appointments at 10:55am.  If her first day at the clinic falls on a Tuesday or Thursday, she needs to arrive at 10:15am in order to complete intake paperwork prior to her first treatment. If her first day in the clinic falls on a Saturday, we will ask that she report to the clinic the Friday before (between 8am-4pm) in order to complete paperwork.  Navigator attempted to meet with patient to provide outpatient clinic information verbally and in writing, however, she states she is too sleepy from her heart cath procedure this morning and asked that Navigator return later this afternoon. Navigator agrees and asked if her family will be here also. She states that her husband and son should be on their way up shortly.  Navigator will follow up. Nephrologist, Attending and CM notified of outpatient HD plan.   Alphonzo Cruise, [place] [facility]igator [phone]

## 2020-10-13 NOTE — Progress Notes (Signed)
Renal Navigator has not yet received seat schedule from HD clinic [facility]) requested by patient today. Navigator attempted to follow with patient/family by phone, but there was no answer. Navigator informed Renal PA and updated Nephrologist.  Alphonzo Cruise, [place] [facility] Navigator [phone]

## 2020-10-13 NOTE — Progress Notes (Signed)
PROGRESS NOTE    Patricia Mata  ZOX:[id number] DOB: Dec 08, 1955 DOA: 10/09/2020 PCP: [name], FNP    Brief Narrative:  Patient is 65 year old female with history of paroxysmal A. fib, pulmonary hypertension, severe aortic stenosis, type 2 diabetes on insulin, severe hypertension, CKD stage V, colon cancer status post hemicolectomy, myelodysplastic syndrome and transfusion dependent anemia, chronic diastolic heart failure and epistaxis who presented to the emergency room with chest pain, found to have elevated troponins, severe aortic stenosis, fluid overload and since then admitted to the hospital and treated for following conditions.   Assessment & Plan:   Principal Problem:   NSTEMI (non-ST elevated myocardial infarction) ([place]) Active Problems:   Anemia due to chronic kidney disease   Anemia in chronic kidney disease   MDS (myelodysplastic syndrome) (HCC)   Essential hypertension   Aortic stenosis, severe   Carotid artery disease (HCC)   PAF (paroxysmal atrial fibrillation) (HCC)   Hypothyroidism   Pulmonary hypertension (HCC)   DM2 (diabetes mellitus, type 2) (HCC)   Elevated troponin   Chronic kidney disease, stage 5 (HCC)   Symptomatic anemia   Angina pectoris (HCC)   Angina at rest [facility])  Non-STEMI: Troponin peaked at 7000, EKG with global ischemia, hemoglobin 4.9.  Currently symptoms controlled. Currently remains on aspirin, beta-blockers and hydralazine. Cardiac cath on 4/1 with diffuse coronary artery disease, not favorable for TAVR as per cardiology report.  Medical management.  Severe aortic stenosis and moderate mitral stenosis: Followed by heart failure team.  CT scans and cardiac cath today not favorable for TAVR as per cardiology.  Medical management.  Tolerating dialysis so far.    ESRD /CKD stage V with fluid overload: Followed by nephrology.  Received subsequent dialysis on 3/30, 3/31.  Outpatient dialysis scheduled.  Final scheduling  pending.  Paroxysmal A. fib: On normal sinus rhythm.  Currently on amiodarone and rate controlled.  Not on anticoagulation due to recurrent severe anemia and transfusion dependent anemia as well has epistaxis.  Myelodysplastic syndrome/transfusion dependent anemia/anemia of chronic kidney disease: Continues to need transfusion.  If needed, she can be transfused with dialysis.  Hemoglobin 7.3 today.  No indication for transfusion.  Hypothyroidism: On Synthroid.  Stable.  Type 2 diabetes on insulin: Continue Lantus and sliding scale insulin.  Adequate.  Recurrent epistaxis: Had epistaxis overnight 3/30.  Cauterized and packed by ENT, removal in 5 days.  Continue to mobilize patient.  Clinically stabilizing.  She should be able to go home once outpatient dialysis chair is available.  DVT prophylaxis: Place and maintain sequential compression device Start: 10/09/20 1657   Code Status: Full code Family Communication: Husband at the bedside Disposition Plan: Status is: Inpatient  Remains inpatient appropriate because:Ongoing diagnostic testing needed not appropriate for outpatient work up and IV treatments appropriate due to intensity of illness or inability to take PO   Dispo: The patient is from: Home              Anticipated d/c is to: Home              Patient currently is not medically stable to d/c.   Difficult to place patient No         Consultants:   Nephrology  Cardiology  Procedures:   Permacath placement 3/30  Hemodialysis 3/30  Cardiac cath 4/1  Antimicrobials:   None   Subjective: Patient seen and examined.  No complaints.  Came back from catheterization.  Husband was at the bedside.  Different questions were  answered.  Denies any chest pain or shortness of breath.  Looking forward to go home, not finding good options for dialysis. Patient was questioning whether her dialysis is permanent, I explained to her and her husband that this is most likely  permanent thing.  Objective: Vitals:   10/13/20 1107 10/13/20 1322 10/13/20 1726 10/13/20 1734  BP: (!) 65/36 (!) 83/73 (!) 143/54 (!) 143/54  Pulse:      Resp: 14 16    Temp: 98.2 F (36.8 C) 97.7 F (36.5 C)    TempSrc: Oral Oral    SpO2: 99% 100% 100%   Weight:      Height:        Intake/Output Summary (Last 24 hours) at 10/13/2020 1745 Last data filed at 10/13/2020 1030 Gross per 24 hour  Intake 363 ml  Output --  Net 363 ml   Filed Weights   10/12/20 0444 10/12/20 1000 10/12/20 1245  Weight: 92.4 kg 92 kg 91 kg    Examination: General: Patient looks comfortable.  On room air.  Left naris is packed.  Dry. Cardiovascular: S1-S2 normal. Respiratory: Bilateral clear.  Right chest permacath clean and dry. Gastrointestinal: Soft and nontender.  Bowel sounds present. Ext: No edema.  No cyanosis.   Data Reviewed: I have personally reviewed following labs and imaging studies  CBC: Recent Labs  Lab 10/09/20 1540 10/10/20 0939 10/11/20 0357 10/12/20 0212 10/13/20 0242 10/13/20 0807 10/13/20 0817  WBC 6.7 8.0 8.8 7.4 6.0  --   --   NEUTROABS 5.5 6.6  --   --   --   --   --   HGB 4.6* 8.3* 7.3* 7.2* 7.3* 7.1* 7.8*  7.8*  HCT 14.2* 23.8* 20.9* 21.0* 20.9* 21.0* 23.0*  23.0*  MCV 97.9 90.2 89.7 92.5 91.3  --   --   PLT 196 178 191 175 165  --   --    Basic Metabolic Panel: Recent Labs  Lab 10/09/20 0554 10/10/20 0939 10/11/20 0357 10/12/20 0212 10/13/20 0807 10/13/20 0817  NA 132* 134* 135 136 140 137  137  K 3.1* 5.2* 4.2 3.8 3.5 3.8  3.8  CL 98 99 100 102  --   --   CO2 20* 24 24 26   --   --   GLUCOSE 208* 160* 161* 143*  --   --   BUN 123* 153* 163* 88*  --   --   CREATININE 4.05* 4.78* 5.15* 3.83*  --   --   CALCIUM 9.0 9.5 9.5 8.9  --   --    GFR: Estimated Creatinine Clearance: 16 mL/min (A) (by C-G formula based on SCr of 3.83 mg/dL (H)). Liver Function Tests: Recent Labs  Lab 10/09/20 0554  AST 113*  ALT 75*  ALKPHOS 110  BILITOT 1.0   PROT 6.0*  ALBUMIN 3.1*   Recent Labs  Lab 10/09/20 0554  LIPASE 32   No results for input(s): AMMONIA in the last 168 hours. Coagulation Profile: Recent Labs  Lab 10/10/20 1052  INR 1.4*   Cardiac Enzymes: No results for input(s): CKTOTAL, CKMB, CKMBINDEX, TROPONINI in the last 168 hours. BNP (last 3 results) No results for input(s): PROBNP in the last 8760 hours. HbA1C: No results for input(s): HGBA1C in the last 72 hours. CBG: Recent Labs  Lab 10/12/20 1300 10/12/20 1607 10/12/20 2105 10/13/20 1106 10/13/20 1650  GLUCAP 116* 115* 167* 224* 114*   Lipid Profile: No results for input(s): CHOL, HDL, LDLCALC, TRIG, CHOLHDL, LDLDIRECT in  the last 72 hours. Thyroid Function Tests: No results for input(s): TSH, T4TOTAL, FREET4, T3FREE, THYROIDAB in the last 72 hours. Anemia Panel: No results for input(s): VITAMINB12, FOLATE, FERRITIN, TIBC, IRON, RETICCTPCT in the last 72 hours. Sepsis Labs: No results for input(s): PROCALCITON, LATICACIDVEN in the last 168 hours.  Recent Results (from the past 240 hour(s))  Resp Panel by RT-PCR (Flu A&B, Covid) Nasopharyngeal Swab     Status: None   Collection Time: 10/09/20  1:37 PM   Specimen: Nasopharyngeal Swab; Nasopharyngeal(NP) swabs in vial transport medium  Result Value Ref Range Status   SARS Coronavirus 2 by RT PCR NEGATIVE NEGATIVE Final    Comment: (NOTE) SARS-CoV-2 target nucleic acids are NOT DETECTED.  The SARS-CoV-2 RNA is generally detectable in upper respiratory specimens during the acute phase of infection. The lowest concentration of SARS-CoV-2 viral copies this assay can detect is 138 copies/mL. A negative result does not preclude SARS-Cov-2 infection and should not be used as the sole basis for treatment or other patient management decisions. A negative result may occur with  improper specimen collection/handling, submission of specimen other than nasopharyngeal swab, presence of viral mutation(s) within  the areas targeted by this assay, and inadequate number of viral copies(<138 copies/mL). A negative result must be combined with clinical observations, patient history, and epidemiological information. The expected result is Negative.  Fact Sheet for Patients:  EntrepreneurPulse.com.au  Fact Sheet for Healthcare Providers:  IncredibleEmployment.be  This test is no t yet approved or cleared by the [place] FDA and  has been authorized for detection and/or diagnosis of SARS-CoV-2 by FDA under an Emergency Use Authorization (EUA). This EUA will remain  in effect (meaning this test can be used) for the duration of the COVID-19 declaration under Section 564(b)(1) of the Act, 21 U.S.C.section 360bbb-3(b)(1), unless the authorization is terminated  or revoked sooner.       Influenza A by PCR NEGATIVE NEGATIVE Final   Influenza B by PCR NEGATIVE NEGATIVE Final    Comment: (NOTE) The Xpert Xpress SARS-CoV-2/FLU/RSV plus assay is intended as an aid in the diagnosis of influenza from Nasopharyngeal swab specimens and should not be used as a sole basis for treatment. Nasal washings and aspirates are unacceptable for Xpert Xpress SARS-CoV-2/FLU/RSV testing.  Fact Sheet for Patients: EntrepreneurPulse.com.au  Fact Sheet for Healthcare Providers: IncredibleEmployment.be  This test is not yet approved or cleared by the [place] FDA and has been authorized for detection and/or diagnosis of SARS-CoV-2 by FDA under an Emergency Use Authorization (EUA). This EUA will remain in effect (meaning this test can be used) for the duration of the COVID-19 declaration under Section 564(b)(1) of the Act, 21 U.S.C. section 360bbb-3(b)(1), unless the authorization is terminated or revoked.  Performed at [facility], [place] [address] [place], [place] [address]   MRSA PCR Screening     Status: None   Collection Time:  10/11/20  9:38 PM   Specimen: Nasal Mucosa; Nasopharyngeal  Result Value Ref Range Status   MRSA by PCR NEGATIVE NEGATIVE Final    Comment:        The GeneXpert MRSA Assay (FDA approved for NASAL specimens only), is one component of a comprehensive MRSA colonization surveillance program. It is not intended to diagnose MRSA infection nor to guide or monitor treatment for MRSA infections. Performed at [facility], [place] [address], [place], [place] [address]          Radiology Studies: CARDIAC CATHETERIZATION  Result  Date: 10/13/2020  Mid RCA lesion is 20% stenosed.  Mid LM lesion is 60% stenosed.  Ost Cx to Prox Cx lesion is 30% stenosed.  Mid LAD lesion is 30% stenosed.  Findings: Ao = 152/38 (76) LV =  186/25 RA =  6 RV = 76/11 PA = 74/19 (43) PCW = unable to obtain Fick cardiac output/index = 9.1/4.6 PVR = 2.0 (using LVEDP) Ao sat = 96% PA sat = 67%. 67% AoV: Crossed easily with JR4 and J-wire. Peak-to-peak gradient 57mmHg  Mean gradient 13mmHG AVA 1.4cm2 Assessment: 1. Significant CAD with 60% mid LM disease. O/w non-obstructive CAD 2. EF 60% 3. Moderate to severe pulmonary HTN due to high output 4. Moderate to severe AS Plan/Discussion: Results d/w TAVR team. Given unfavorable anatomy, burden of comorbidites and need for alternative access, I suspect risks of TAVR outweigh possible benefits. Favor medical management. Glori Bickers, MD 9:15 AM   CT CORONARY MORPH W/CTA COR W/SCORE Lewanda Rife W/CM &/OR WO/CM  Addendum Date: 10/13/2020   ADDENDUM REPORT: 10/13/2020 08:09 EXAM: OVER-READ INTERPRETATION  CT CHEST The following report is an over-read performed by radiologist Dr. Samara Snide [facility], [place] on 10/13/2020. This over-read does not include interpretation of cardiac or coronary anatomy or pathology. The coronary CTA interpretation by the cardiologist is attached. COMPARISON:  03/03/2019 chest CT. FINDINGS: Please see the separate concurrent chest CT angiogram report  for details. IMPRESSION: Please see the separate concurrent chest CT angiogram report for details. Electronically Signed   By: Ilona Sorrel M.D.   On: 10/13/2020 08:09   Result Date: 10/13/2020 CLINICAL DATA:  Severe Aortic Stenosis. EXAM: Cardiac TAVR CT TECHNIQUE: The patient was scanned on a [employer]. A 120 kV retrospective scan was triggered in the descending thoracic aorta at 111 HU's. Gantry rotation speed was 250 msecs and collimation was .6 mm. No beta blockade or nitro were given. The 3D data set was reconstructed in 5% intervals of the R-R cycle. Systolic and diastolic phases were analyzed on a dedicated work station using MPR, MIP and VRT modes. The patient received 80 cc of contrast. FINDINGS: Image quality: Excellent. Noise artifact is: Limited. Valve Morphology: The aortic valve is tricuspid. The valve is severely calcified with restricted leaflet movement in systole. There is bulky calcification of the LCC and this leaflet is immobile. Aortic Valve Calcium score: 2725 Aortic annular dimension: Phase assessed: 20% Annular area: 411 mm2 Annular perimeter: 74.4 mm Max diameter: 27.2 mm Min diameter: 20.9 mm Annular and subannular calcification: Mild annular calcification that is not protruding noted under the [place] and LCC. Optimal coplanar projection: LAO 4 CRA 0 Coronary Artery Height above Annulus: Left Main: 11.1 mm Right Coronary: 16.9 mm Sinus of Valsalva Measurements: Non-coronary: 30 mm Right-coronary: 29 mm Left-coronary: 30 mm Sinus of Valsalva Height: Non-coronary: 19.5 mm Right-coronary: 20.9 mm Left-coronary: 21.4 mm Sinotubular Junction: 27 mm with calcified circumferential plaque. Ascending Thoracic Aorta: 33 mm. Coronary Arteries: Normal coronary origin. Right dominance. The study was performed without use of NTG and is insufficient for plaque evaluation. Severe 3-vessel calcifications noted. Cardiac Morphology: Right Atrium: Right atrial size is within normal limits. A  venous catheter terminates in the RA. Right Ventricle: The right ventricular cavity is within normal limits. Left Atrium: Left atrial size is normal in size. There is mixing artifact in the LAA, cannot exclude thrombus. Left Ventricle: The ventricular cavity size is within normal limits. There are no stigmata of prior infarction. There is no abnormal filling defect. LVEF=61%.  Pulmonary arteries: Dilated in size suggestive of pulmonary hypertension. No proximal filling defect. Pulmonary veins: Normal pulmonary venous drainage. Pericardium: Normal thickness with no significant effusion or calcium present. Mitral Valve: The mitral valve is normal structure with moderate annular calcification. Extra-cardiac findings: See attached radiology report for non-cardiac structures. IMPRESSION: 1. Tricuspid aortic valve with severe aortic stenosis (calcium score 2725). 2. Bulky calcifications of the LCC. 3. Mild annular calcifications. 4. Annular measurements appropriate for 23 mm Edwards Sapien 3 TAVR (411 mm2). 5. Sufficient coronary to annulus distance. 6. Optimal Fluoroscopic Angle for Delivery: LAO 4 CRA 0 7. Mixing artifact in the LAA, unable to exclude thrombus. 8. Dilated pulmonary artery suggestive of pulmonary hypertension. 9. 3-vessel coronary calcifications noted. [place] T. Audie Box, MD Electronically Signed: By: Eleonore Chiquito On: 10/13/2020 06:28   CT ANGIO CHEST AORTA W/CM & OR WO/CM  Result Date: 10/13/2020 CLINICAL DATA:  Inpatient with severe aortic stenosis. Pre-TAVR planning. EXAM: CT ANGIOGRAPHY CHEST, ABDOMEN AND PELVIS TECHNIQUE: Multidetector CT imaging through the chest, abdomen and pelvis was performed using the standard protocol during bolus administration of intravenous contrast. Multiplanar reconstructed images and MIPs were obtained and reviewed to evaluate the vascular anatomy. CONTRAST:  198mL OMNIPAQUE IOHEXOL 350 MG/ML SOLN COMPARISON:  03/02/2020 chest CT. FINDINGS: CTA CHEST FINDINGS  Cardiovascular: Mild-to-moderate cardiomegaly. Diffusely calcified and thickened aortic valve. No significant pericardial effusion/thickening. Right internal jugular central venous catheter terminates in the right atrium. Three-vessel coronary atherosclerosis. Atherosclerotic nonaneurysmal thoracic aorta. Dilated main pulmonary artery (3.7 cm diameter). No central pulmonary emboli. Mediastinum/Nodes: Subcentimeter hypodense bilateral thyroid nodules. Not clinically significant; no follow-up imaging recommended (ref: J Am Coll Radiol. 2015 Feb;12(2): 143-50). Unremarkable esophagus. No axillary adenopathy. No pathologically enlarged mediastinal nodes. Borderline mildly prominent bilateral hilar nodes, largest 1.0 cm on the left (series 4/image 65). Lungs/Pleura: No pneumothorax. No pleural effusion. No acute consolidative airspace disease, lung masses or significant pulmonary nodules. Mild mosaic attenuation in both lungs. Musculoskeletal: No aggressive appearing focal osseous lesions. Mild thoracic spondylosis. CTA ABDOMEN AND PELVIS FINDINGS Hepatobiliary: Normal liver with no liver mass. Normal gallbladder with no radiopaque cholelithiasis. No biliary ductal dilatation. Pancreas: Normal, with no mass or duct dilation. Spleen: Normal size. No mass. Adrenals/Urinary Tract: Normal adrenals. No contour deforming renal masses. No hydronephrosis. Bladder is nondistended. Suggestion of mild diffuse bladder wall thickening. Small amount of gas in the nondependent bladder lumen. Stomach/Bowel: Normal non-distended stomach. Subtotal right hemicolectomy with ileocolic anastomosis in the right abdomen. No small bowel dilatation or wall thickening. Scattered mild colonic diverticulosis with no large bowel wall thickening or acute pericolonic fat stranding. Vascular/Lymphatic: Severely atherosclerotic nonaneurysmal abdominal aorta. No pathologically enlarged lymph nodes in the abdomen or pelvis. Reproductive: Multiple small  coarsely calcified degenerated uterine fibroids, largest 1.7 cm in the upper right uterine body. No adnexal masses. Other: No pneumoperitoneum, ascites or focal fluid collection. Musculoskeletal: No aggressive appearing focal osseous lesions. Moderate lumbar spondylosis. VASCULAR MEASUREMENTS PERTINENT TO TAVR: AORTA: Minimal Aortic Diameter-0 mm Severity of Aortic Calcification-severe There is occlusive or near close calcified atherosclerotic plaque in the infrarenal abdominal aorta involving approximately 2 cm in length segment of the aorta. RIGHT PELVIS: Right Common Iliac Artery - Minimal Diameter-6.9 x 5.8 mm Tortuosity-mild Calcification-severe Right External Iliac Artery - Minimal Diameter-7.2 x 6.7 mm Tortuosity-mild Calcification-mild Right Common Femoral Artery - Minimal Diameter-7.8 x 6.8 mm Tortuosity-mild Calcification-moderate LEFT PELVIS: Left Common Iliac Artery - Minimal Diameter-10.2 x 5.9 mm Tortuosity-mild Calcification-severe Left External Iliac Artery - Minimal Diameter-7.3 x 6.3 mm Tortuosity-mild  Calcification-mild-to-moderate Left Common Femoral Artery - Minimal Diameter-7.0 x 3.6 mm Tortuosity-mild Calcification-moderate to severe Review of the MIP images confirms the above findings. IMPRESSION: 1. Vascular findings and measurements pertinent to potential TAVR procedure, as detailed. Note is made of focal occlusive or near occlusive calcified atherosclerotic plaque involving a 2 cm length segment of the infrarenal abdominal aorta. 2. Mild-to-moderate cardiomegaly. Three-vessel coronary atherosclerosis. 3. Diffusely thickened and calcified aortic valve, compatible with reported history of severe aortic stenosis. 4. Dilated main pulmonary artery, suggesting chronic pulmonary arterial hypertension. 5. Mild mosaic attenuation in both lungs, nonspecific, potentially due to mosaic perfusion from pulmonary vascular disease or air trapping from small airways disease. 6. Small amount of gas in the  nondependent bladder lumen, correlate for history of recent bladder instrumentation. Suggestion of mild diffuse bladder wall thickening. Suggest correlation with urinalysis to exclude acute cystitis. 7. Mild colonic diverticulosis. 8. Multiple small coarsely calcified degenerated uterine fibroids. 9. Aortic Atherosclerosis (ICD10-I70.0). Electronically Signed   By: Ilona Sorrel M.D.   On: 10/13/2020 09:00   CT Angio Abd/Pel w/ and/or w/o  Result Date: 10/13/2020 CLINICAL DATA:  Inpatient with severe aortic stenosis. Pre-TAVR planning. EXAM: CT ANGIOGRAPHY CHEST, ABDOMEN AND PELVIS TECHNIQUE: Multidetector CT imaging through the chest, abdomen and pelvis was performed using the standard protocol during bolus administration of intravenous contrast. Multiplanar reconstructed images and MIPs were obtained and reviewed to evaluate the vascular anatomy. CONTRAST:  166mL OMNIPAQUE IOHEXOL 350 MG/ML SOLN COMPARISON:  03/02/2020 chest CT. FINDINGS: CTA CHEST FINDINGS Cardiovascular: Mild-to-moderate cardiomegaly. Diffusely calcified and thickened aortic valve. No significant pericardial effusion/thickening. Right internal jugular central venous catheter terminates in the right atrium. Three-vessel coronary atherosclerosis. Atherosclerotic nonaneurysmal thoracic aorta. Dilated main pulmonary artery (3.7 cm diameter). No central pulmonary emboli. Mediastinum/Nodes: Subcentimeter hypodense bilateral thyroid nodules. Not clinically significant; no follow-up imaging recommended (ref: J Am Coll Radiol. 2015 Feb;12(2): 143-50). Unremarkable esophagus. No axillary adenopathy. No pathologically enlarged mediastinal nodes. Borderline mildly prominent bilateral hilar nodes, largest 1.0 cm on the left (series 4/image 65). Lungs/Pleura: No pneumothorax. No pleural effusion. No acute consolidative airspace disease, lung masses or significant pulmonary nodules. Mild mosaic attenuation in both lungs. Musculoskeletal: No aggressive  appearing focal osseous lesions. Mild thoracic spondylosis. CTA ABDOMEN AND PELVIS FINDINGS Hepatobiliary: Normal liver with no liver mass. Normal gallbladder with no radiopaque cholelithiasis. No biliary ductal dilatation. Pancreas: Normal, with no mass or duct dilation. Spleen: Normal size. No mass. Adrenals/Urinary Tract: Normal adrenals. No contour deforming renal masses. No hydronephrosis. Bladder is nondistended. Suggestion of mild diffuse bladder wall thickening. Small amount of gas in the nondependent bladder lumen. Stomach/Bowel: Normal non-distended stomach. Subtotal right hemicolectomy with ileocolic anastomosis in the right abdomen. No small bowel dilatation or wall thickening. Scattered mild colonic diverticulosis with no large bowel wall thickening or acute pericolonic fat stranding. Vascular/Lymphatic: Severely atherosclerotic nonaneurysmal abdominal aorta. No pathologically enlarged lymph nodes in the abdomen or pelvis. Reproductive: Multiple small coarsely calcified degenerated uterine fibroids, largest 1.7 cm in the upper right uterine body. No adnexal masses. Other: No pneumoperitoneum, ascites or focal fluid collection. Musculoskeletal: No aggressive appearing focal osseous lesions. Moderate lumbar spondylosis. VASCULAR MEASUREMENTS PERTINENT TO TAVR: AORTA: Minimal Aortic Diameter-0 mm Severity of Aortic Calcification-severe There is occlusive or near close calcified atherosclerotic plaque in the infrarenal abdominal aorta involving approximately 2 cm in length segment of the aorta. RIGHT PELVIS: Right Common Iliac Artery - Minimal Diameter-6.9 x 5.8 mm Tortuosity-mild Calcification-severe Right External Iliac Artery - Minimal Diameter-7.2 x 6.7 mm Tortuosity-mild  Calcification-mild Right Common Femoral Artery - Minimal Diameter-7.8 x 6.8 mm Tortuosity-mild Calcification-moderate LEFT PELVIS: Left Common Iliac Artery - Minimal Diameter-10.2 x 5.9 mm Tortuosity-mild Calcification-severe Left  External Iliac Artery - Minimal Diameter-7.3 x 6.3 mm Tortuosity-mild Calcification-mild-to-moderate Left Common Femoral Artery - Minimal Diameter-7.0 x 3.6 mm Tortuosity-mild Calcification-moderate to severe Review of the MIP images confirms the above findings. IMPRESSION: 1. Vascular findings and measurements pertinent to potential TAVR procedure, as detailed. Note is made of focal occlusive or near occlusive calcified atherosclerotic plaque involving a 2 cm length segment of the infrarenal abdominal aorta. 2. Mild-to-moderate cardiomegaly. Three-vessel coronary atherosclerosis. 3. Diffusely thickened and calcified aortic valve, compatible with reported history of severe aortic stenosis. 4. Dilated main pulmonary artery, suggesting chronic pulmonary arterial hypertension. 5. Mild mosaic attenuation in both lungs, nonspecific, potentially due to mosaic perfusion from pulmonary vascular disease or air trapping from small airways disease. 6. Small amount of gas in the nondependent bladder lumen, correlate for history of recent bladder instrumentation. Suggestion of mild diffuse bladder wall thickening. Suggest correlation with urinalysis to exclude acute cystitis. 7. Mild colonic diverticulosis. 8. Multiple small coarsely calcified degenerated uterine fibroids. 9. Aortic Atherosclerosis (ICD10-I70.0). Electronically Signed   By: Ilona Sorrel M.D.   On: 10/13/2020 09:00        Scheduled Meds: . allopurinol  100 mg Oral Daily  . amiodarone  200 mg Oral Daily  . atorvastatin  40 mg Oral Daily  . carvedilol  6.25 mg Oral BID  . Chlorhexidine Gluconate Cloth  6 each Topical Q0600  . cloNIDine  0.1 mg Oral BID  . fluticasone  1 spray Each Nare Daily  . furosemide  40 mg Oral Daily  . hydrALAZINE  100 mg Oral TID  . insulin aspart  0-9 Units Subcutaneous TID WC  . insulin glargine  20 Units Subcutaneous Daily  . latanoprost  1 drop Both Eyes QHS  . levothyroxine  150 mcg Oral QAC breakfast  .  linagliptin  5 mg Oral Daily  . loratadine  10 mg Oral QODAY  . pantoprazole  40 mg Oral Daily  . polyethylene glycol  17 g Oral Daily  . sildenafil  20 mg Oral TID  . sodium chloride flush  3 mL Intravenous Q12H   Continuous Infusions: . cefTRIAXone (ROCEPHIN)  IV 1 g (10/13/20 1656)     LOS: 4 days    Time spent: 32 minutes    Barb Merino, MD [facility] Pager [phone]

## 2020-10-13 NOTE — Progress Notes (Signed)
Advanced Heart Failure Rounding Note  PCP: Darleen Crocker Primary Cardiologist:   Subjective:    Tolerated HD session # 2 yesterday. Nose is packed due to epistaxis.   Denies CP or SOB. TAVR CTs yesterday shows severe aortic disease with likely occlusive plaque in distal aorta   Objective:   Weight Range: 91 kg Body mass index is 34.44 kg/m.   Vital Signs:   Temp:  [97.6 F (36.4 C)-98.7 F (37.1 C)] 98.7 F (37.1 C) (04/01 0450) Pulse Rate:  [55-66] 55 (03/31 2022) Resp:  [15-18] 16 (04/01 0450) BP: (115-158)/(46-75) 123/46 (04/01 0450) SpO2:  [100 %] 100 % (04/01 0450) Weight:  [91 kg-92 kg] 91 kg (03/31 1245) Last BM Date: 10/08/20  Weight change: Filed Weights   10/12/20 0444 10/12/20 1000 10/12/20 1245  Weight: 92.4 kg 92 kg 91 kg    Intake/Output:   Intake/Output Summary (Last 24 hours) at 10/13/2020 0721 Last data filed at 10/12/2020 2131 Gross per 24 hour  Intake 6 ml  Output 1000 ml  Net -994 ml      Physical Exam    General:  Lying in bed . No resp difficulty HEENT: normal + nasal packing Neck: supple. JVP up Carotids 2+ bilat; + bruits. No lymphadenopathy or thryomegaly appreciated. Cor: PMI nondisplaced. Regular rate & rhythm. 3/6 AS  + tunneled HD cath Lungs: clear Abdomen: obese soft, nontender, nondistended. No hepatosplenomegaly. No bruits or masses. Good bowel sounds. Extremities: no cyanosis, clubbing, rash, edema Neuro: alert & orientedx3, cranial nerves grossly intact. moves all 4 extremities w/o difficulty. Affect pleasant  Telemetry   NSR rates 50-60s  Labs    CBC Recent Labs    10/10/20 0939 10/11/20 0357 10/12/20 0212 10/13/20 0242  WBC 8.0   < > 7.4 6.0  NEUTROABS 6.6  --   --   --   HGB 8.3*   < > 7.2* 7.3*  HCT 23.8*   < > 21.0* 20.9*  MCV 90.2   < > 92.5 91.3  PLT 178   < > 175 165   < > = values in this interval not displayed.   Basic Metabolic Panel Recent Labs    10/11/20 0357 10/12/20 0212  NA 135  136  K 4.2 3.8  CL 100 102  CO2 24 26  GLUCOSE 161* 143*  BUN 163* 88*  CREATININE 5.15* 3.83*  CALCIUM 9.5 8.9   Liver Function Tests No results for input(s): AST, ALT, ALKPHOS, BILITOT, PROT, ALBUMIN in the last 72 hours. No results for input(s): LIPASE, AMYLASE in the last 72 hours. Cardiac Enzymes No results for input(s): CKTOTAL, CKMB, CKMBINDEX, TROPONINI in the last 72 hours.  BNP: BNP (last 3 results) Recent Labs    06/07/20 0826 07/06/20 1733  BNP 489.2* 932.6*    ProBNP (last 3 results) No results for input(s): PROBNP in the last 8760 hours.   D-Dimer No results for input(s): DDIMER in the last 72 hours. Hemoglobin A1C No results for input(s): HGBA1C in the last 72 hours. Fasting Lipid Panel No results for input(s): CHOL, HDL, LDLCALC, TRIG, CHOLHDL, LDLDIRECT in the last 72 hours. Thyroid Function Tests No results for input(s): TSH, T4TOTAL, T3FREE, THYROIDAB in the last 72 hours.  Invalid input(s): FREET3  Other results:   Imaging    CT CORONARY MORPH W/CTA COR W/SCORE W/CA W/CM &/OR WO/CM  Result Date: 10/13/2020 CLINICAL DATA:  Severe Aortic Stenosis. EXAM: Cardiac TAVR CT TECHNIQUE: The patient was scanned on a [employer]  Force scanner. A 120 kV retrospective scan was triggered in the descending thoracic aorta at 111 HU's. Gantry rotation speed was 250 msecs and collimation was .6 mm. No beta blockade or nitro were given. The 3D data set was reconstructed in 5% intervals of the R-R cycle. Systolic and diastolic phases were analyzed on a dedicated work station using MPR, MIP and VRT modes. The patient received 80 cc of contrast. FINDINGS: Image quality: Excellent. Noise artifact is: Limited. Valve Morphology: The aortic valve is tricuspid. The valve is severely calcified with restricted leaflet movement in systole. There is bulky calcification of the LCC and this leaflet is immobile. Aortic Valve Calcium score: 2725 Aortic annular dimension: Phase assessed:  20% Annular area: 411 mm2 Annular perimeter: 74.4 mm Max diameter: 27.2 mm Min diameter: 20.9 mm Annular and subannular calcification: Mild annular calcification that is not protruding noted under the [place] and LCC. Optimal coplanar projection: LAO 4 CRA 0 Coronary Artery Height above Annulus: Left Main: 11.1 mm Right Coronary: 16.9 mm Sinus of Valsalva Measurements: Non-coronary: 30 mm Right-coronary: 29 mm Left-coronary: 30 mm Sinus of Valsalva Height: Non-coronary: 19.5 mm Right-coronary: 20.9 mm Left-coronary: 21.4 mm Sinotubular Junction: 27 mm with calcified circumferential plaque. Ascending Thoracic Aorta: 33 mm. Coronary Arteries: Normal coronary origin. Right dominance. The study was performed without use of NTG and is insufficient for plaque evaluation. Severe 3-vessel calcifications noted. Cardiac Morphology: Right Atrium: Right atrial size is within normal limits. A venous catheter terminates in the RA. Right Ventricle: The right ventricular cavity is within normal limits. Left Atrium: Left atrial size is normal in size. There is mixing artifact in the LAA, cannot exclude thrombus. Left Ventricle: The ventricular cavity size is within normal limits. There are no stigmata of prior infarction. There is no abnormal filling defect. LVEF=61%. Pulmonary arteries: Dilated in size suggestive of pulmonary hypertension. No proximal filling defect. Pulmonary veins: Normal pulmonary venous drainage. Pericardium: Normal thickness with no significant effusion or calcium present. Mitral Valve: The mitral valve is normal structure with moderate annular calcification. Extra-cardiac findings: See attached radiology report for non-cardiac structures. IMPRESSION: 1. Tricuspid aortic valve with severe aortic stenosis (calcium score 2725). 2. Bulky calcifications of the LCC. 3. Mild annular calcifications. 4. Annular measurements appropriate for 23 mm Edwards Sapien 3 TAVR (411 mm2). 5. Sufficient coronary to annulus distance.  6. Optimal Fluoroscopic Angle for Delivery: LAO 4 CRA 0 7. Mixing artifact in the LAA, unable to exclude thrombus. 8. Dilated pulmonary artery suggestive of pulmonary hypertension. 9. 3-vessel coronary calcifications noted. [place] T. Audie Box, MD Electronically Signed   By: Eleonore Chiquito   On: 10/13/2020 06:28     Medications:     Scheduled Medications: . [MAR Hold] allopurinol  100 mg Oral Daily  . [MAR Hold] amiodarone  200 mg Oral Daily  . [MAR Hold] atorvastatin  40 mg Oral Daily  . [MAR Hold] carvedilol  6.25 mg Oral BID  . [MAR Hold] Chlorhexidine Gluconate Cloth  6 each Topical Q0600  . [MAR Hold] cloNIDine  0.1 mg Oral BID  . [MAR Hold] fluticasone  1 spray Each Nare Daily  . [MAR Hold] furosemide  40 mg Oral Daily  . [MAR Hold] hydrALAZINE  100 mg Oral TID  . [MAR Hold] insulin aspart  0-9 Units Subcutaneous TID WC  . [MAR Hold] insulin glargine  20 Units Subcutaneous Daily  . [MAR Hold] latanoprost  1 drop Both Eyes QHS  . [MAR Hold] levothyroxine  150 mcg Oral QAC breakfast  . [  MAR Hold] linagliptin  5 mg Oral Daily  . [MAR Hold] loratadine  10 mg Oral QODAY  . [MAR Hold] pantoprazole  40 mg Oral Daily  . [MAR Hold] polyethylene glycol  17 g Oral Daily  . [MAR Hold] sildenafil  20 mg Oral TID  . [MAR Hold] sodium chloride flush  3 mL Intravenous Q12H    Infusions: . sodium chloride    . sodium chloride 10 mL/hr at 10/13/20 0613  . [MAR Hold] cefTRIAXone (ROCEPHIN)  IV 1 g (10/12/20 1818)    PRN Medications: sodium chloride, [MAR Hold] acetaminophen, [MAR Hold] diphenhydrAMINE, [MAR Hold]  HYDROmorphone (DILAUDID) injection, [MAR Hold] lidocaine, [MAR Hold] lidocaine, [MAR Hold] lidocaine-EPINEPHrine, [MAR Hold] neomycin-bacitracin-polymyxin, [MAR Hold] nitroGLYCERIN, [MAR Hold] ondansetron (ZOFRAN) IV, [MAR Hold] oxyCODONE-acetaminophen, [MAR Hold] oxymetazoline, [MAR Hold] silver nitrate applicators, [MAR Hold] sodium chloride, sodium chloride flush, [MAR Hold]  traZODone    Patient Profile   Patricia Mata is a 65 y.o. 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 Karle Starch.  Assessment/Plan   1. NSTEMI - peak troponin 7,352 - Cath 2019 no CAD - ? Demand ischemia in the setting of hgb 4.9.   - s/p 3 u PRBC's transfusion 10/11/20  - R/LHC today   2. Severe aortic stenosis and moderate mitral stenosis - Echo 09/2020 EF 50-55% AVA 0.71, mean gradient 65 compared with 0.84 and 66 on 05/2020 - Seen by structural team plan possible TAVR however TAVR CTs with nearly occlusive distal aorta - anatomy unfavorable for access - IF TAVR is an option could consider bridging BAV to let AVF mature and avoiding having TAVR valvle in place with temporary HD cath (high risk endocarditis) - Plan R/L cath today - Will continue to discuss with structural tram but burden of comorbidities may make TAVR unfeasible  3. PAH - suspect multifactorial in nature suspect including high output due to anemia, WHO group II (diastolic HF and valvular disease) and III (OSA) but given degree of PH cannot exclude WHO Group I component. - RHC 2019 with severe PAH; repeat [place] 04/2019 with improved PA pressures - prior VQ negative, PFTs 8/20 with normal spirometry and moderately reduced DLCO. - on sildenafil, continue  4. CKD stage V - nephrology following had [facility] placement and 2nd HD session 10/12/2020 - AVF in place  (placed 09/26/20)   5. Paroxysmal atrial fibrillation - maintaining NSR on home amiodarone - not on OAC due to transfusion dependent anemia  6. Chronic diastolic CHF -  EF normal but with severe AS, MS and G2DD - Volume improving with HD  7. MDS with severe transfusion-dependent anemia -Diagnosed in 2017 treated with 5-azacytidine finished 7 cycles of therapy completed in 2017.  -Currently only on a hematopoiesis stimulator luspatercept , last oncology note Alen Blew) says any further treatments would be palliative - hgb 4.9 on admit - hgb 7.3 consider another unit of RBCs today  8. Severe epistaixs - s/p nasal packing 3/31 - on abx  Length of Stay: [place], MD  10/13/2020, 7:21 AM  Advanced Heart Failure Team Pager [phone] (M-F; 7a - 4p)  Please  contact [facility] for night-coverage after hours (4p -7a ) and weekends on [email or web address]

## 2020-10-13 NOTE — Progress Notes (Signed)
   10/13/20 8891  Assess: MEWS Score  Temp 98.3 F (36.8 C)  BP (!) 65/29  ECG Heart Rate (!) 53  Resp 16  Level of Consciousness Alert  SpO2 100 %  O2 Device Room Air  Assess: MEWS Score  MEWS Temp 0  MEWS Systolic 3  MEWS Pulse 0  MEWS RR 0  MEWS LOC 0  MEWS Score 3  MEWS Score Color Yellow  Assess: if the MEWS score is Yellow or Red  Were vital signs taken at a resting state? Yes  Focused Assessment No change from prior assessment  Early Detection of Sepsis Score *See Row Information* Low  MEWS guidelines implemented *See Row Information* Yes (Unable to obtain BP from arms due to restrictions)  Treat  Pain Scale 0-10  Pain Score 0  Notify: Charge Nurse/RN  Name of Charge Nurse/RN Notified Chrissy, RN  Date Charge Nurse/RN Notified 10/13/20  Time Charge Nurse/RN Notified [phone]  Document  Patient Outcome Stabilized after interventions  Progress note created (see row info) Yes  BP has been taken on her legs and not giving accurate readings. Her left arm is restricted due to TR band.  Right arm is restricted for fistula.

## 2020-10-13 NOTE — Progress Notes (Signed)
[facility] Progress Note   Assessment/Plan  Patricia Mata is an 65 y.o. female with CKD 5, HTN, AS (severe), mitral stenosis (mod/severe), gout, pulmonary HTN, DM, MDS, gout who presented with chest pain, found to have severe anemia in the 4s and is seen for AKI on CKD.   **Chest pain, severe AS: thought to be demand ischemia in setting of severe anemia and AS and now resolved.  Cardiology is following and she underwent L/RHC today.  CP has resolved and not recurred with HD.  Avoid anemia.   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  With volume overload and I suspect uremic platelet dysfunction leading to the nose bleeds and life threatening anemia we proceeded to initiate HD.  TDC and 1st dialysis 10/11/20; tolerated well.  2nd HD 3/31.  Plan next HD tomorrow. CLIP underway.   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  Transfuse pRBC PRN Hb < 7.  I think part of the issue lately has been epistaxis which may be exacerbated by uremic platelet dysfunction. ENT has packed nose - to remain 5d with outpt f/u.   **HTN:  Cont home medications. Well controlled currently.   **Pulmonary HTN: on sildenafil.   **A fibrillation, paroxysmal:  On amiodarone.    Will follow, call with clinical status changes.   Subjective/Interval Events:    Had L/RHC this AM - results pending; she's not aware of findings.  Has tolerated HD fine - next is tomorrow.  Had nasal packing by ENT yesterday.  No new issues today.   Objective Vitals:   10/13/20 0926 10/13/20 1052 10/13/20 1107 10/13/20 1322  BP: (!) 65/57 (!) 66/48 (!) 65/36 (!) 83/73  Pulse: (!) 53     Resp: 16  14 16   Temp:   98.2 F (36.8 C) 97.7 F (36.5 C)  TempSrc:   Oral Oral  SpO2: 100% 100% 99% 100%  Weight:      Height:       Physical Exam Gen: awake and alert, looks comfortable in bed Eyes:  anicteric ENT: MMM Neck: supple CV:  RRR, IV/VI SEM, no rub Abd:  soft Lungs: normal WOB on 2L [place], dec BS bases GU: no foley Extr:  No edema, RUE +t/b, not mature; L radial cath site with hemostasis Neuro: no asterixis, conversant Skin: no rashes  Additional Objective Labs: Basic Metabolic Panel: Recent Labs  Lab 10/10/20 0939 10/11/20 0357 10/12/20 0212  NA 134* 135 136  K 5.2* 4.2 3.8  CL 99 100 102  CO2 24 24 26   GLUCOSE 160* 161* 143*  BUN 153* 163* 88*  CREATININE 4.78* 5.15* 3.83*  CALCIUM 9.5 9.5 8.9   Liver Function Tests: Recent Labs  Lab 10/09/20 0554  AST 113*  ALT 75*  ALKPHOS 110  BILITOT 1.0  PROT 6.0*  ALBUMIN 3.1*   Recent Labs  Lab 10/09/20 0554  LIPASE 32   CBC: Recent Labs  Lab 10/09/20 1540 10/10/20 0939 10/11/20 0357 10/12/20 0212 10/13/20 0242  WBC 6.7 8.0 8.8 7.4 6.0  NEUTROABS 5.5 6.6  --   --   --   HGB 4.6* 8.3* 7.3* 7.2* 7.3*  HCT 14.2* 23.8* 20.9* 21.0* 20.9*  MCV 97.9 90.2 89.7 92.5 91.3  PLT 196 178 191 175 165   Blood Culture    Component Value Date/Time   SDES  09/24/2019 1144    TRACHEAL ASPIRATE Performed at [facility], [place] [address], [place], [place] [address]    SPECREQUEST  09/24/2019 1144    NONE Performed at [facility], [place] [address], [place], [place] [address]    CULT FEW CANDIDA ALBICANS 09/24/2019 1144   REPTSTATUS 09/27/2019 FINAL 09/24/2019 1144    Cardiac Enzymes: No results for input(s): CKTOTAL, CKMB, CKMBINDEX, TROPONINI in the last 168 hours. CBG: Recent Labs  Lab 10/12/20 0728 10/12/20 1300 10/12/20 1607 10/12/20 2105 10/13/20 1106  GLUCAP 174* 116* 115* 167* 224*   Iron Studies: No results for input(s): IRON, TIBC, TRANSFERRIN, FERRITIN in the last 72 hours. @lablastinr3 @ Studies/Results: CARDIAC CATHETERIZATION  Result Date: 10/13/2020  Mid RCA lesion is 20% stenosed.  Mid LM lesion is 60% stenosed.  Ost Cx to Prox Cx lesion is  30% stenosed.  Mid LAD lesion is 30% stenosed.  Findings: Ao = 152/38 (76) LV =  186/25 RA =  6 RV = 76/11 PA = 74/19 (43) PCW = unable to obtain Fick cardiac output/index = 9.1/4.6 PVR = 2.0 (using LVEDP) Ao sat = 96% PA sat = 67%. 67% AoV: Crossed easily with JR4 and J-wire. Peak-to-peak gradient 102mmHg  Mean gradient 82mmHG AVA 1.4cm2 Assessment: 1. Significant CAD with 60% mid LM disease. O/w non-obstructive CAD 2. EF 60% 3. Moderate to severe pulmonary HTN due to high output 4. Moderate to severe AS Plan/Discussion: Results d/w TAVR team. Given unfavorable anatomy, burden of comorbidites and need for alternative access, I suspect risks of TAVR outweigh possible benefits. Favor medical management. Glori Bickers, MD 9:15 AM   CT CORONARY MORPH W/CTA COR W/SCORE Lewanda Rife W/CM &/OR WO/CM  Addendum Date: 10/13/2020   ADDENDUM REPORT: 10/13/2020 08:09 EXAM: OVER-READ INTERPRETATION  CT CHEST The following report is an over-read performed by radiologist Dr. Samara Snide [facility], [place] on 10/13/2020. This over-read does not include interpretation of cardiac or coronary anatomy or pathology. The coronary CTA interpretation by the cardiologist is attached. COMPARISON:  03/03/2019 chest CT. FINDINGS: Please see the separate concurrent chest CT angiogram report for details. IMPRESSION: Please see the separate concurrent chest CT angiogram report for details. Electronically Signed   By: Ilona Sorrel M.D.   On: 10/13/2020 08:09   Result Date: 10/13/2020 CLINICAL DATA:  Severe Aortic Stenosis. EXAM: Cardiac TAVR CT TECHNIQUE: The patient was scanned on a [employer]. A 120 kV retrospective scan was triggered in the descending thoracic aorta at 111 HU's. Gantry rotation speed was 250 msecs and collimation was .6 mm. No beta blockade or nitro were given. The 3D data set was reconstructed in 5% intervals of the R-R cycle. Systolic and diastolic phases were analyzed on a dedicated work station using MPR,  MIP and VRT modes. The patient received 80 cc of contrast. FINDINGS: Image quality: Excellent. Noise artifact is: Limited. Valve Morphology: The aortic valve is tricuspid. The valve is severely calcified with restricted leaflet movement in systole. There is bulky calcification of the LCC and this leaflet is immobile. Aortic Valve Calcium score: 2725 Aortic annular dimension: Phase assessed: 20% Annular area: 411 mm2 Annular perimeter: 74.4 mm Max diameter: 27.2 mm Min diameter: 20.9 mm Annular and subannular calcification: Mild annular calcification that is not protruding noted under the [place] and LCC. Optimal coplanar projection: LAO 4 CRA 0 Coronary Artery Height above Annulus: Left Main: 11.1 mm Right Coronary: 16.9 mm Sinus of Valsalva Measurements: Non-coronary: 30 mm Right-coronary: 29 mm Left-coronary: 30  mm Sinus of Valsalva Height: Non-coronary: 19.5 mm Right-coronary: 20.9 mm Left-coronary: 21.4 mm Sinotubular Junction: 27 mm with calcified circumferential plaque. Ascending Thoracic Aorta: 33 mm. Coronary Arteries: Normal coronary origin. Right dominance. The study was performed without use of NTG and is insufficient for plaque evaluation. Severe 3-vessel calcifications noted. Cardiac Morphology: Right Atrium: Right atrial size is within normal limits. A venous catheter terminates in the RA. Right Ventricle: The right ventricular cavity is within normal limits. Left Atrium: Left atrial size is normal in size. There is mixing artifact in the LAA, cannot exclude thrombus. Left Ventricle: The ventricular cavity size is within normal limits. There are no stigmata of prior infarction. There is no abnormal filling defect. LVEF=61%. Pulmonary arteries: Dilated in size suggestive of pulmonary hypertension. No proximal filling defect. Pulmonary veins: Normal pulmonary venous drainage. Pericardium: Normal thickness with no significant effusion or calcium present. Mitral Valve: The mitral valve is normal structure with  moderate annular calcification. Extra-cardiac findings: See attached radiology report for non-cardiac structures. IMPRESSION: 1. Tricuspid aortic valve with severe aortic stenosis (calcium score 2725). 2. Bulky calcifications of the LCC. 3. Mild annular calcifications. 4. Annular measurements appropriate for 23 mm Edwards Sapien 3 TAVR (411 mm2). 5. Sufficient coronary to annulus distance. 6. Optimal Fluoroscopic Angle for Delivery: LAO 4 CRA 0 7. Mixing artifact in the LAA, unable to exclude thrombus. 8. Dilated pulmonary artery suggestive of pulmonary hypertension. 9. 3-vessel coronary calcifications noted. [place] T. Audie Box, MD Electronically Signed: By: Eleonore Chiquito On: 10/13/2020 06:28   CT ANGIO CHEST AORTA W/CM & OR WO/CM  Result Date: 10/13/2020 CLINICAL DATA:  Inpatient with severe aortic stenosis. Pre-TAVR planning. EXAM: CT ANGIOGRAPHY CHEST, ABDOMEN AND PELVIS TECHNIQUE: Multidetector CT imaging through the chest, abdomen and pelvis was performed using the standard protocol during bolus administration of intravenous contrast. Multiplanar reconstructed images and MIPs were obtained and reviewed to evaluate the vascular anatomy. CONTRAST:  151mL OMNIPAQUE IOHEXOL 350 MG/ML SOLN COMPARISON:  03/02/2020 chest CT. FINDINGS: CTA CHEST FINDINGS Cardiovascular: Mild-to-moderate cardiomegaly. Diffusely calcified and thickened aortic valve. No significant pericardial effusion/thickening. Right internal jugular central venous catheter terminates in the right atrium. Three-vessel coronary atherosclerosis. Atherosclerotic nonaneurysmal thoracic aorta. Dilated main pulmonary artery (3.7 cm diameter). No central pulmonary emboli. Mediastinum/Nodes: Subcentimeter hypodense bilateral thyroid nodules. Not clinically significant; no follow-up imaging recommended (ref: J Am Coll Radiol. 2015 Feb;12(2): 143-50). Unremarkable esophagus. No axillary adenopathy. No pathologically enlarged mediastinal nodes. Borderline  mildly prominent bilateral hilar nodes, largest 1.0 cm on the left (series 4/image 65). Lungs/Pleura: No pneumothorax. No pleural effusion. No acute consolidative airspace disease, lung masses or significant pulmonary nodules. Mild mosaic attenuation in both lungs. Musculoskeletal: No aggressive appearing focal osseous lesions. Mild thoracic spondylosis. CTA ABDOMEN AND PELVIS FINDINGS Hepatobiliary: Normal liver with no liver mass. Normal gallbladder with no radiopaque cholelithiasis. No biliary ductal dilatation. Pancreas: Normal, with no mass or duct dilation. Spleen: Normal size. No mass. Adrenals/Urinary Tract: Normal adrenals. No contour deforming renal masses. No hydronephrosis. Bladder is nondistended. Suggestion of mild diffuse bladder wall thickening. Small amount of gas in the nondependent bladder lumen. Stomach/Bowel: Normal non-distended stomach. Subtotal right hemicolectomy with ileocolic anastomosis in the right abdomen. No small bowel dilatation or wall thickening. Scattered mild colonic diverticulosis with no large bowel wall thickening or acute pericolonic fat stranding. Vascular/Lymphatic: Severely atherosclerotic nonaneurysmal abdominal aorta. No pathologically enlarged lymph nodes in the abdomen or pelvis. Reproductive: Multiple small coarsely calcified degenerated uterine fibroids, largest 1.7 cm in the upper right uterine  body. No adnexal masses. Other: No pneumoperitoneum, ascites or focal fluid collection. Musculoskeletal: No aggressive appearing focal osseous lesions. Moderate lumbar spondylosis. VASCULAR MEASUREMENTS PERTINENT TO TAVR: AORTA: Minimal Aortic Diameter-0 mm Severity of Aortic Calcification-severe There is occlusive or near close calcified atherosclerotic plaque in the infrarenal abdominal aorta involving approximately 2 cm in length segment of the aorta. RIGHT PELVIS: Right Common Iliac Artery - Minimal Diameter-6.9 x 5.8 mm Tortuosity-mild Calcification-severe Right External  Iliac Artery - Minimal Diameter-7.2 x 6.7 mm Tortuosity-mild Calcification-mild Right Common Femoral Artery - Minimal Diameter-7.8 x 6.8 mm Tortuosity-mild Calcification-moderate LEFT PELVIS: Left Common Iliac Artery - Minimal Diameter-10.2 x 5.9 mm Tortuosity-mild Calcification-severe Left External Iliac Artery - Minimal Diameter-7.3 x 6.3 mm Tortuosity-mild Calcification-mild-to-moderate Left Common Femoral Artery - Minimal Diameter-7.0 x 3.6 mm Tortuosity-mild Calcification-moderate to severe Review of the MIP images confirms the above findings. IMPRESSION: 1. Vascular findings and measurements pertinent to potential TAVR procedure, as detailed. Note is made of focal occlusive or near occlusive calcified atherosclerotic plaque involving a 2 cm length segment of the infrarenal abdominal aorta. 2. Mild-to-moderate cardiomegaly. Three-vessel coronary atherosclerosis. 3. Diffusely thickened and calcified aortic valve, compatible with reported history of severe aortic stenosis. 4. Dilated main pulmonary artery, suggesting chronic pulmonary arterial hypertension. 5. Mild mosaic attenuation in both lungs, nonspecific, potentially due to mosaic perfusion from pulmonary vascular disease or air trapping from small airways disease. 6. Small amount of gas in the nondependent bladder lumen, correlate for history of recent bladder instrumentation. Suggestion of mild diffuse bladder wall thickening. Suggest correlation with urinalysis to exclude acute cystitis. 7. Mild colonic diverticulosis. 8. Multiple small coarsely calcified degenerated uterine fibroids. 9. Aortic Atherosclerosis (ICD10-I70.0). Electronically Signed   By: Ilona Sorrel M.D.   On: 10/13/2020 09:00   CT Angio Abd/Pel w/ and/or w/o  Result Date: 10/13/2020 CLINICAL DATA:  Inpatient with severe aortic stenosis. Pre-TAVR planning. EXAM: CT ANGIOGRAPHY CHEST, ABDOMEN AND PELVIS TECHNIQUE: Multidetector CT imaging through the chest, abdomen and pelvis was  performed using the standard protocol during bolus administration of intravenous contrast. Multiplanar reconstructed images and MIPs were obtained and reviewed to evaluate the vascular anatomy. CONTRAST:  168mL OMNIPAQUE IOHEXOL 350 MG/ML SOLN COMPARISON:  03/02/2020 chest CT. FINDINGS: CTA CHEST FINDINGS Cardiovascular: Mild-to-moderate cardiomegaly. Diffusely calcified and thickened aortic valve. No significant pericardial effusion/thickening. Right internal jugular central venous catheter terminates in the right atrium. Three-vessel coronary atherosclerosis. Atherosclerotic nonaneurysmal thoracic aorta. Dilated main pulmonary artery (3.7 cm diameter). No central pulmonary emboli. Mediastinum/Nodes: Subcentimeter hypodense bilateral thyroid nodules. Not clinically significant; no follow-up imaging recommended (ref: J Am Coll Radiol. 2015 Feb;12(2): 143-50). Unremarkable esophagus. No axillary adenopathy. No pathologically enlarged mediastinal nodes. Borderline mildly prominent bilateral hilar nodes, largest 1.0 cm on the left (series 4/image 65). Lungs/Pleura: No pneumothorax. No pleural effusion. No acute consolidative airspace disease, lung masses or significant pulmonary nodules. Mild mosaic attenuation in both lungs. Musculoskeletal: No aggressive appearing focal osseous lesions. Mild thoracic spondylosis. CTA ABDOMEN AND PELVIS FINDINGS Hepatobiliary: Normal liver with no liver mass. Normal gallbladder with no radiopaque cholelithiasis. No biliary ductal dilatation. Pancreas: Normal, with no mass or duct dilation. Spleen: Normal size. No mass. Adrenals/Urinary Tract: Normal adrenals. No contour deforming renal masses. No hydronephrosis. Bladder is nondistended. Suggestion of mild diffuse bladder wall thickening. Small amount of gas in the nondependent bladder lumen. Stomach/Bowel: Normal non-distended stomach. Subtotal right hemicolectomy with ileocolic anastomosis in the right abdomen. No small bowel  dilatation or wall thickening. Scattered mild colonic diverticulosis with no large bowel  wall thickening or acute pericolonic fat stranding. Vascular/Lymphatic: Severely atherosclerotic nonaneurysmal abdominal aorta. No pathologically enlarged lymph nodes in the abdomen or pelvis. Reproductive: Multiple small coarsely calcified degenerated uterine fibroids, largest 1.7 cm in the upper right uterine body. No adnexal masses. Other: No pneumoperitoneum, ascites or focal fluid collection. Musculoskeletal: No aggressive appearing focal osseous lesions. Moderate lumbar spondylosis. VASCULAR MEASUREMENTS PERTINENT TO TAVR: AORTA: Minimal Aortic Diameter-0 mm Severity of Aortic Calcification-severe There is occlusive or near close calcified atherosclerotic plaque in the infrarenal abdominal aorta involving approximately 2 cm in length segment of the aorta. RIGHT PELVIS: Right Common Iliac Artery - Minimal Diameter-6.9 x 5.8 mm Tortuosity-mild Calcification-severe Right External Iliac Artery - Minimal Diameter-7.2 x 6.7 mm Tortuosity-mild Calcification-mild Right Common Femoral Artery - Minimal Diameter-7.8 x 6.8 mm Tortuosity-mild Calcification-moderate LEFT PELVIS: Left Common Iliac Artery - Minimal Diameter-10.2 x 5.9 mm Tortuosity-mild Calcification-severe Left External Iliac Artery - Minimal Diameter-7.3 x 6.3 mm Tortuosity-mild Calcification-mild-to-moderate Left Common Femoral Artery - Minimal Diameter-7.0 x 3.6 mm Tortuosity-mild Calcification-moderate to severe Review of the MIP images confirms the above findings. IMPRESSION: 1. Vascular findings and measurements pertinent to potential TAVR procedure, as detailed. Note is made of focal occlusive or near occlusive calcified atherosclerotic plaque involving a 2 cm length segment of the infrarenal abdominal aorta. 2. Mild-to-moderate cardiomegaly. Three-vessel coronary atherosclerosis. 3. Diffusely thickened and calcified aortic valve, compatible with reported history  of severe aortic stenosis. 4. Dilated main pulmonary artery, suggesting chronic pulmonary arterial hypertension. 5. Mild mosaic attenuation in both lungs, nonspecific, potentially due to mosaic perfusion from pulmonary vascular disease or air trapping from small airways disease. 6. Small amount of gas in the nondependent bladder lumen, correlate for history of recent bladder instrumentation. Suggestion of mild diffuse bladder wall thickening. Suggest correlation with urinalysis to exclude acute cystitis. 7. Mild colonic diverticulosis. 8. Multiple small coarsely calcified degenerated uterine fibroids. 9. Aortic Atherosclerosis (ICD10-I70.0). Electronically Signed   By: Ilona Sorrel M.D.   On: 10/13/2020 09:00   Medications: . cefTRIAXone (ROCEPHIN)  IV 1 g (10/12/20 1818)   . allopurinol  100 mg Oral Daily  . amiodarone  200 mg Oral Daily  . atorvastatin  40 mg Oral Daily  . carvedilol  6.25 mg Oral BID  . Chlorhexidine Gluconate Cloth  6 each Topical Q0600  . cloNIDine  0.1 mg Oral BID  . fluticasone  1 spray Each Nare Daily  . furosemide  40 mg Oral Daily  . hydrALAZINE  100 mg Oral TID  . insulin aspart  0-9 Units Subcutaneous TID WC  . insulin glargine  20 Units Subcutaneous Daily  . latanoprost  1 drop Both Eyes QHS  . levothyroxine  150 mcg Oral QAC breakfast  . linagliptin  5 mg Oral Daily  . loratadine  10 mg Oral QODAY  . pantoprazole  40 mg Oral Daily  . polyethylene glycol  17 g Oral Daily  . sildenafil  20 mg Oral TID  . sodium chloride flush  3 mL Intravenous Q12H        Jannifer Hick MD 10/13/2020, 2:30 PM  [facility]

## 2020-10-13 NOTE — H&P (View-Only) (Signed)
Advanced Heart Failure Rounding Note  PCP: Darleen Crocker Primary Cardiologist:   Subjective:    Tolerated HD session # 2 yesterday. Nose is packed due to epistaxis.   Denies CP or SOB. TAVR CTs yesterday shows severe aortic disease with likely occlusive plaque in distal aorta   Objective:   Weight Range: 91 kg Body mass index is 34.44 kg/m.   Vital Signs:   Temp:  [97.6 F (36.4 C)-98.7 F (37.1 C)] 98.7 F (37.1 C) (04/01 0450) Pulse Rate:  [55-66] 55 (03/31 2022) Resp:  [15-18] 16 (04/01 0450) BP: (115-158)/(46-75) 123/46 (04/01 0450) SpO2:  [100 %] 100 % (04/01 0450) Weight:  [91 kg-92 kg] 91 kg (03/31 1245) Last BM Date: 10/08/20  Weight change: Filed Weights   10/12/20 0444 10/12/20 1000 10/12/20 1245  Weight: 92.4 kg 92 kg 91 kg    Intake/Output:   Intake/Output Summary (Last 24 hours) at 10/13/2020 0721 Last data filed at 10/12/2020 2131 Gross per 24 hour  Intake 6 ml  Output 1000 ml  Net -994 ml      Physical Exam    General:  Lying in bed . No resp difficulty HEENT: normal + nasal packing Neck: supple. JVP up Carotids 2+ bilat; + bruits. No lymphadenopathy or thryomegaly appreciated. Cor: PMI nondisplaced. Regular rate & rhythm. 3/6 AS  + tunneled HD cath Lungs: clear Abdomen: obese soft, nontender, nondistended. No hepatosplenomegaly. No bruits or masses. Good bowel sounds. Extremities: no cyanosis, clubbing, rash, edema Neuro: alert & orientedx3, cranial nerves grossly intact. moves all 4 extremities w/o difficulty. Affect pleasant  Telemetry   NSR rates 50-60s  Labs    CBC Recent Labs    10/10/20 0939 10/11/20 0357 10/12/20 0212 10/13/20 0242  WBC 8.0   < > 7.4 6.0  NEUTROABS 6.6  --   --   --   HGB 8.3*   < > 7.2* 7.3*  HCT 23.8*   < > 21.0* 20.9*  MCV 90.2   < > 92.5 91.3  PLT 178   < > 175 165   < > = values in this interval not displayed.   Basic Metabolic Panel Recent Labs    10/11/20 0357 10/12/20 0212  NA 135  136  K 4.2 3.8  CL 100 102  CO2 24 26  GLUCOSE 161* 143*  BUN 163* 88*  CREATININE 5.15* 3.83*  CALCIUM 9.5 8.9   Liver Function Tests No results for input(s): AST, ALT, ALKPHOS, BILITOT, PROT, ALBUMIN in the last 72 hours. No results for input(s): LIPASE, AMYLASE in the last 72 hours. Cardiac Enzymes No results for input(s): CKTOTAL, CKMB, CKMBINDEX, TROPONINI in the last 72 hours.  BNP: BNP (last 3 results) Recent Labs    06/07/20 0826 07/06/20 1733  BNP 489.2* 932.6*    ProBNP (last 3 results) No results for input(s): PROBNP in the last 8760 hours.   D-Dimer No results for input(s): DDIMER in the last 72 hours. Hemoglobin A1C No results for input(s): HGBA1C in the last 72 hours. Fasting Lipid Panel No results for input(s): CHOL, HDL, LDLCALC, TRIG, CHOLHDL, LDLDIRECT in the last 72 hours. Thyroid Function Tests No results for input(s): TSH, T4TOTAL, T3FREE, THYROIDAB in the last 72 hours.  Invalid input(s): FREET3  Other results:   Imaging    CT CORONARY MORPH W/CTA COR W/SCORE W/CA W/CM &/OR WO/CM  Result Date: 10/13/2020 CLINICAL DATA:  Severe Aortic Stenosis. EXAM: Cardiac TAVR CT TECHNIQUE: The patient was scanned on a [employer]  Force scanner. A 120 kV retrospective scan was triggered in the descending thoracic aorta at 111 HU's. Gantry rotation speed was 250 msecs and collimation was .6 mm. No beta blockade or nitro were given. The 3D data set was reconstructed in 5% intervals of the R-R cycle. Systolic and diastolic phases were analyzed on a dedicated work station using MPR, MIP and VRT modes. The patient received 80 cc of contrast. FINDINGS: Image quality: Excellent. Noise artifact is: Limited. Valve Morphology: The aortic valve is tricuspid. The valve is severely calcified with restricted leaflet movement in systole. There is bulky calcification of the LCC and this leaflet is immobile. Aortic Valve Calcium score: 2725 Aortic annular dimension: Phase assessed:  20% Annular area: 411 mm2 Annular perimeter: 74.4 mm Max diameter: 27.2 mm Min diameter: 20.9 mm Annular and subannular calcification: Mild annular calcification that is not protruding noted under the [place] and LCC. Optimal coplanar projection: LAO 4 CRA 0 Coronary Artery Height above Annulus: Left Main: 11.1 mm Right Coronary: 16.9 mm Sinus of Valsalva Measurements: Non-coronary: 30 mm Right-coronary: 29 mm Left-coronary: 30 mm Sinus of Valsalva Height: Non-coronary: 19.5 mm Right-coronary: 20.9 mm Left-coronary: 21.4 mm Sinotubular Junction: 27 mm with calcified circumferential plaque. Ascending Thoracic Aorta: 33 mm. Coronary Arteries: Normal coronary origin. Right dominance. The study was performed without use of NTG and is insufficient for plaque evaluation. Severe 3-vessel calcifications noted. Cardiac Morphology: Right Atrium: Right atrial size is within normal limits. A venous catheter terminates in the RA. Right Ventricle: The right ventricular cavity is within normal limits. Left Atrium: Left atrial size is normal in size. There is mixing artifact in the LAA, cannot exclude thrombus. Left Ventricle: The ventricular cavity size is within normal limits. There are no stigmata of prior infarction. There is no abnormal filling defect. LVEF=61%. Pulmonary arteries: Dilated in size suggestive of pulmonary hypertension. No proximal filling defect. Pulmonary veins: Normal pulmonary venous drainage. Pericardium: Normal thickness with no significant effusion or calcium present. Mitral Valve: The mitral valve is normal structure with moderate annular calcification. Extra-cardiac findings: See attached radiology report for non-cardiac structures. IMPRESSION: 1. Tricuspid aortic valve with severe aortic stenosis (calcium score 2725). 2. Bulky calcifications of the LCC. 3. Mild annular calcifications. 4. Annular measurements appropriate for 23 mm Edwards Sapien 3 TAVR (411 mm2). 5. Sufficient coronary to annulus distance.  6. Optimal Fluoroscopic Angle for Delivery: LAO 4 CRA 0 7. Mixing artifact in the LAA, unable to exclude thrombus. 8. Dilated pulmonary artery suggestive of pulmonary hypertension. 9. 3-vessel coronary calcifications noted. [place] T. Audie Box, MD Electronically Signed   By: Eleonore Chiquito   On: 10/13/2020 06:28     Medications:     Scheduled Medications: . [MAR Hold] allopurinol  100 mg Oral Daily  . [MAR Hold] amiodarone  200 mg Oral Daily  . [MAR Hold] atorvastatin  40 mg Oral Daily  . [MAR Hold] carvedilol  6.25 mg Oral BID  . [MAR Hold] Chlorhexidine Gluconate Cloth  6 each Topical Q0600  . [MAR Hold] cloNIDine  0.1 mg Oral BID  . [MAR Hold] fluticasone  1 spray Each Nare Daily  . [MAR Hold] furosemide  40 mg Oral Daily  . [MAR Hold] hydrALAZINE  100 mg Oral TID  . [MAR Hold] insulin aspart  0-9 Units Subcutaneous TID WC  . [MAR Hold] insulin glargine  20 Units Subcutaneous Daily  . [MAR Hold] latanoprost  1 drop Both Eyes QHS  . [MAR Hold] levothyroxine  150 mcg Oral QAC breakfast  . [  MAR Hold] linagliptin  5 mg Oral Daily  . [MAR Hold] loratadine  10 mg Oral QODAY  . [MAR Hold] pantoprazole  40 mg Oral Daily  . [MAR Hold] polyethylene glycol  17 g Oral Daily  . [MAR Hold] sildenafil  20 mg Oral TID  . [MAR Hold] sodium chloride flush  3 mL Intravenous Q12H    Infusions: . sodium chloride    . sodium chloride 10 mL/hr at 10/13/20 0613  . [MAR Hold] cefTRIAXone (ROCEPHIN)  IV 1 g (10/12/20 1818)    PRN Medications: sodium chloride, [MAR Hold] acetaminophen, [MAR Hold] diphenhydrAMINE, [MAR Hold]  HYDROmorphone (DILAUDID) injection, [MAR Hold] lidocaine, [MAR Hold] lidocaine, [MAR Hold] lidocaine-EPINEPHrine, [MAR Hold] neomycin-bacitracin-polymyxin, [MAR Hold] nitroGLYCERIN, [MAR Hold] ondansetron (ZOFRAN) IV, [MAR Hold] oxyCODONE-acetaminophen, [MAR Hold] oxymetazoline, [MAR Hold] silver nitrate applicators, [MAR Hold] sodium chloride, sodium chloride flush, [MAR Hold]  traZODone    Patient Profile   Patricia Mata is a 65 y.o. 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 Karle Starch.  Assessment/Plan   1. NSTEMI - peak troponin 7,352 - Cath 2019 no CAD - ? Demand ischemia in the setting of hgb 4.9.   - s/p 3 u PRBC's transfusion 10/11/20  - R/LHC today   2. Severe aortic stenosis and moderate mitral stenosis - Echo 09/2020 EF 50-55% AVA 0.71, mean gradient 65 compared with 0.84 and 66 on 05/2020 - Seen by structural team plan possible TAVR however TAVR CTs with nearly occlusive distal aorta - anatomy unfavorable for access - IF TAVR is an option could consider bridging BAV to let AVF mature and avoiding having TAVR valvle in place with temporary HD cath (high risk endocarditis) - Plan R/L cath today - Will continue to discuss with structural tram but burden of comorbidities may make TAVR unfeasible  3. PAH - suspect multifactorial in nature suspect including high output due to anemia, WHO group II (diastolic HF and valvular disease) and III (OSA) but given degree of PH cannot exclude WHO Group I component. - RHC 2019 with severe PAH; repeat [place] 04/2019 with improved PA pressures - prior VQ negative, PFTs 8/20 with normal spirometry and moderately reduced DLCO. - on sildenafil, continue  4. CKD stage V - nephrology following had [facility] placement and 2nd HD session 10/12/2020 - AVF in place  (placed 09/26/20)   5. Paroxysmal atrial fibrillation - maintaining NSR on home amiodarone - not on OAC due to transfusion dependent anemia  6. Chronic diastolic CHF -  EF normal but with severe AS, MS and G2DD - Volume improving with HD  7. MDS with severe transfusion-dependent anemia -Diagnosed in 2017 treated with 5-azacytidine finished 7 cycles of therapy completed in 2017.  -Currently only on a hematopoiesis stimulator luspatercept , last oncology note Alen Blew) says any further treatments would be palliative - hgb 4.9 on admit - hgb 7.3 consider another unit of RBCs today  8. Severe epistaixs - s/p nasal packing 3/31 - on abx  Length of Stay: [place], MD  10/13/2020, 7:21 AM  Advanced Heart Failure Team Pager [phone] (M-F; 7a - 4p)  Please  contact [facility] for night-coverage after hours (4p -7a ) and weekends on [email or web address]

## 2020-10-13 NOTE — Interval H&P Note (Signed)
History and Physical Interval Note:  10/13/2020 7:33 AM  Patricia Mata  has presented today for surgery, with the diagnosis of heart failure and AS.  The various methods of treatment have been discussed with the patient and family. After consideration of risks, benefits and other options for treatment, the patient has consented to  Procedure(s): RIGHT/LEFT HEART CATH AND CORONARY ANGIOGRAPHY (N/A) as a surgical intervention.  The patient's history has been reviewed, patient examined, no change in status, stable for surgery.  I have reviewed the patient's chart and labs.  Questions were answered to the patient's satisfaction.     Sarha Bartelt

## 2020-10-13 NOTE — Progress Notes (Signed)
Progress Note  Patient Name: Patricia Mata Date of Encounter: 10/13/2020  [facility] Cardiologist: Quay Burow, MD   Subjective   The patient is feeling okay.  She denies any recurrent chest pain.  No shortness of breath.  She is resting comfortably.  Inpatient Medications    Scheduled Meds: . allopurinol  100 mg Oral Daily  . amiodarone  200 mg Oral Daily  . atorvastatin  40 mg Oral Daily  . carvedilol  6.25 mg Oral BID  . Chlorhexidine Gluconate Cloth  6 each Topical Q0600  . cloNIDine  0.1 mg Oral BID  . fluticasone  1 spray Each Nare Daily  . furosemide  40 mg Oral Daily  . hydrALAZINE  100 mg Oral TID  . insulin aspart  0-9 Units Subcutaneous TID WC  . insulin glargine  20 Units Subcutaneous Daily  . latanoprost  1 drop Both Eyes QHS  . levothyroxine  150 mcg Oral QAC breakfast  . linagliptin  5 mg Oral Daily  . loratadine  10 mg Oral QODAY  . pantoprazole  40 mg Oral Daily  . polyethylene glycol  17 g Oral Daily  . sildenafil  20 mg Oral TID  . sodium chloride flush  3 mL Intravenous Q12H   Continuous Infusions: . cefTRIAXone (ROCEPHIN)  IV 1 g (10/12/20 1818)   PRN Meds: acetaminophen, diphenhydrAMINE, HYDROmorphone (DILAUDID) injection, lidocaine, lidocaine, lidocaine-EPINEPHrine, neomycin-bacitracin-polymyxin, nitroGLYCERIN, ondansetron (ZOFRAN) IV, oxyCODONE-acetaminophen, oxymetazoline, silver nitrate applicators, sodium chloride, traZODone   Vital Signs    Vitals:   10/13/20 0926 10/13/20 1052 10/13/20 1107 10/13/20 1322  BP: (!) 65/57 (!) 66/48 (!) 65/36 (!) 83/73  Pulse: (!) 53     Resp: 16  14 16   Temp:   98.2 F (36.8 C) 97.7 F (36.5 C)  TempSrc:   Oral Oral  SpO2: 100% 100% 99% 100%  Weight:      Height:        Intake/Output Summary (Last 24 hours) at 10/13/2020 1356 Last data filed at 10/13/2020 1030 Gross per 24 hour  Intake 363 ml  Output --  Net 363 ml   Last 3 Weights 10/12/2020 10/12/2020 10/12/2020  Weight (lbs) 200 lb  9.9 oz 202 lb 13.2 oz 203 lb 12.8 oz  Weight (kg) 91 kg 92 kg 92.443 kg      Telemetry    Sinus rhythm, no significant arrhythmia- Personally Reviewed   Physical Exam  Alert, oriented woman in no distress GEN: No acute distress.   Neck: No JVD Cardiac: RRR, 3/6 harsh crescendo decrescendo murmur heard throughout Respiratory: Clear to auscultation bilaterally. GI: Soft, nontender, non-distended  MS: No edema; No deformity.  Left radial cath site with TR band in place Neuro:  Nonfocal  Psych: Normal affect   Labs    High Sensitivity Troponin:   Recent Labs  Lab 10/09/20 0554 10/09/20 0845 10/09/20 1238 10/09/20 1541 10/09/20 1741  TROPONINIHS 44* 115* 3,163* 3,951* 7,352*      Chemistry Recent Labs  Lab 10/09/20 0554 10/10/20 0939 10/11/20 0357 10/12/20 0212  NA 132* 134* 135 136  K 3.1* 5.2* 4.2 3.8  CL 98 99 100 102  CO2 20* 24 24 26   GLUCOSE 208* 160* 161* 143*  BUN 123* 153* 163* 88*  CREATININE 4.05* 4.78* 5.15* 3.83*  CALCIUM 9.0 9.5 9.5 8.9  PROT 6.0*  --   --   --   ALBUMIN 3.1*  --   --   --   AST 113*  --   --   --  ALT 75*  --   --   --   ALKPHOS 110  --   --   --   BILITOT 1.0  --   --   --   GFRNONAA 12* 10* 9* 12*  ANIONGAP 14 11 11 8      Hematology Recent Labs  Lab 10/11/20 0357 10/12/20 0212 10/13/20 0242  WBC 8.8 7.4 6.0  RBC 2.33* 2.27* 2.29*  HGB 7.3* 7.2* 7.3*  HCT 20.9* 21.0* 20.9*  MCV 89.7 92.5 91.3  MCH 31.3 31.7 31.9  MCHC 34.9 34.3 34.9  RDW 15.9* 16.3* 16.2*  PLT 191 175 165    BNPNo results for input(s): BNP, PROBNP in the last 168 hours.   DDimer No results for input(s): DDIMER in the last 168 hours.   Radiology    CARDIAC CATHETERIZATION  Result Date: 10/13/2020  Mid RCA lesion is 20% stenosed.  Mid LM lesion is 60% stenosed.  Ost Cx to Prox Cx lesion is 30% stenosed.  Mid LAD lesion is 30% stenosed.  Findings: Ao = 152/38 (76) LV =  186/25 RA =  6 RV = 76/11 PA = 74/19 (43) PCW = unable to obtain Fick  cardiac output/index = 9.1/4.6 PVR = 2.0 (using LVEDP) Ao sat = 96% PA sat = 67%. 67% AoV: Crossed easily with JR4 and J-wire. Peak-to-peak gradient 89mmHg  Mean gradient 56mmHG AVA 1.4cm2 Assessment: 1. Significant CAD with 60% mid LM disease. O/w non-obstructive CAD 2. EF 60% 3. Moderate to severe pulmonary HTN due to high output 4. Moderate to severe AS Plan/Discussion: Results d/w TAVR team. Given unfavorable anatomy, burden of comorbidites and need for alternative access, I suspect risks of TAVR outweigh possible benefits. Favor medical management. Glori Bickers, MD 9:15 AM   CT CORONARY MORPH W/CTA COR W/SCORE Lewanda Rife W/CM &/OR WO/CM  Addendum Date: 10/13/2020   ADDENDUM REPORT: 10/13/2020 08:09 EXAM: OVER-READ INTERPRETATION  CT CHEST The following report is an over-read performed by radiologist Dr. Samara Snide [facility], [place] on 10/13/2020. This over-read does not include interpretation of cardiac or coronary anatomy or pathology. The coronary CTA interpretation by the cardiologist is attached. COMPARISON:  03/03/2019 chest CT. FINDINGS: Please see the separate concurrent chest CT angiogram report for details. IMPRESSION: Please see the separate concurrent chest CT angiogram report for details. Electronically Signed   By: Ilona Sorrel M.D.   On: 10/13/2020 08:09   Result Date: 10/13/2020 CLINICAL DATA:  Severe Aortic Stenosis. EXAM: Cardiac TAVR CT TECHNIQUE: The patient was scanned on a [employer]. A 120 kV retrospective scan was triggered in the descending thoracic aorta at 111 HU's. Gantry rotation speed was 250 msecs and collimation was .6 mm. No beta blockade or nitro were given. The 3D data set was reconstructed in 5% intervals of the R-R cycle. Systolic and diastolic phases were analyzed on a dedicated work station using MPR, MIP and VRT modes. The patient received 80 cc of contrast. FINDINGS: Image quality: Excellent. Noise artifact is: Limited. Valve Morphology: The aortic  valve is tricuspid. The valve is severely calcified with restricted leaflet movement in systole. There is bulky calcification of the LCC and this leaflet is immobile. Aortic Valve Calcium score: 2725 Aortic annular dimension: Phase assessed: 20% Annular area: 411 mm2 Annular perimeter: 74.4 mm Max diameter: 27.2 mm Min diameter: 20.9 mm Annular and subannular calcification: Mild annular calcification that is not protruding noted under the [place] and LCC. Optimal coplanar projection: LAO 4 CRA 0 Coronary Artery Height above  Annulus: Left Main: 11.1 mm Right Coronary: 16.9 mm Sinus of Valsalva Measurements: Non-coronary: 30 mm Right-coronary: 29 mm Left-coronary: 30 mm Sinus of Valsalva Height: Non-coronary: 19.5 mm Right-coronary: 20.9 mm Left-coronary: 21.4 mm Sinotubular Junction: 27 mm with calcified circumferential plaque. Ascending Thoracic Aorta: 33 mm. Coronary Arteries: Normal coronary origin. Right dominance. The study was performed without use of NTG and is insufficient for plaque evaluation. Severe 3-vessel calcifications noted. Cardiac Morphology: Right Atrium: Right atrial size is within normal limits. A venous catheter terminates in the RA. Right Ventricle: The right ventricular cavity is within normal limits. Left Atrium: Left atrial size is normal in size. There is mixing artifact in the LAA, cannot exclude thrombus. Left Ventricle: The ventricular cavity size is within normal limits. There are no stigmata of prior infarction. There is no abnormal filling defect. LVEF=61%. Pulmonary arteries: Dilated in size suggestive of pulmonary hypertension. No proximal filling defect. Pulmonary veins: Normal pulmonary venous drainage. Pericardium: Normal thickness with no significant effusion or calcium present. Mitral Valve: The mitral valve is normal structure with moderate annular calcification. Extra-cardiac findings: See attached radiology report for non-cardiac structures. IMPRESSION: 1. Tricuspid aortic valve  with severe aortic stenosis (calcium score 2725). 2. Bulky calcifications of the LCC. 3. Mild annular calcifications. 4. Annular measurements appropriate for 23 mm Edwards Sapien 3 TAVR (411 mm2). 5. Sufficient coronary to annulus distance. 6. Optimal Fluoroscopic Angle for Delivery: LAO 4 CRA 0 7. Mixing artifact in the LAA, unable to exclude thrombus. 8. Dilated pulmonary artery suggestive of pulmonary hypertension. 9. 3-vessel coronary calcifications noted. [place] T. Audie Box, MD Electronically Signed: By: Eleonore Chiquito On: 10/13/2020 06:28   CT ANGIO CHEST AORTA W/CM & OR WO/CM  Result Date: 10/13/2020 CLINICAL DATA:  Inpatient with severe aortic stenosis. Pre-TAVR planning. EXAM: CT ANGIOGRAPHY CHEST, ABDOMEN AND PELVIS TECHNIQUE: Multidetector CT imaging through the chest, abdomen and pelvis was performed using the standard protocol during bolus administration of intravenous contrast. Multiplanar reconstructed images and MIPs were obtained and reviewed to evaluate the vascular anatomy. CONTRAST:  115mL OMNIPAQUE IOHEXOL 350 MG/ML SOLN COMPARISON:  03/02/2020 chest CT. FINDINGS: CTA CHEST FINDINGS Cardiovascular: Mild-to-moderate cardiomegaly. Diffusely calcified and thickened aortic valve. No significant pericardial effusion/thickening. Right internal jugular central venous catheter terminates in the right atrium. Three-vessel coronary atherosclerosis. Atherosclerotic nonaneurysmal thoracic aorta. Dilated main pulmonary artery (3.7 cm diameter). No central pulmonary emboli. Mediastinum/Nodes: Subcentimeter hypodense bilateral thyroid nodules. Not clinically significant; no follow-up imaging recommended (ref: J Am Coll Radiol. 2015 Feb;12(2): 143-50). Unremarkable esophagus. No axillary adenopathy. No pathologically enlarged mediastinal nodes. Borderline mildly prominent bilateral hilar nodes, largest 1.0 cm on the left (series 4/image 65). Lungs/Pleura: No pneumothorax. No pleural effusion. No acute  consolidative airspace disease, lung masses or significant pulmonary nodules. Mild mosaic attenuation in both lungs. Musculoskeletal: No aggressive appearing focal osseous lesions. Mild thoracic spondylosis. CTA ABDOMEN AND PELVIS FINDINGS Hepatobiliary: Normal liver with no liver mass. Normal gallbladder with no radiopaque cholelithiasis. No biliary ductal dilatation. Pancreas: Normal, with no mass or duct dilation. Spleen: Normal size. No mass. Adrenals/Urinary Tract: Normal adrenals. No contour deforming renal masses. No hydronephrosis. Bladder is nondistended. Suggestion of mild diffuse bladder wall thickening. Small amount of gas in the nondependent bladder lumen. Stomach/Bowel: Normal non-distended stomach. Subtotal right hemicolectomy with ileocolic anastomosis in the right abdomen. No small bowel dilatation or wall thickening. Scattered mild colonic diverticulosis with no large bowel wall thickening or acute pericolonic fat stranding. Vascular/Lymphatic: Severely atherosclerotic nonaneurysmal abdominal aorta. No pathologically enlarged lymph nodes  in the abdomen or pelvis. Reproductive: Multiple small coarsely calcified degenerated uterine fibroids, largest 1.7 cm in the upper right uterine body. No adnexal masses. Other: No pneumoperitoneum, ascites or focal fluid collection. Musculoskeletal: No aggressive appearing focal osseous lesions. Moderate lumbar spondylosis. VASCULAR MEASUREMENTS PERTINENT TO TAVR: AORTA: Minimal Aortic Diameter-0 mm Severity of Aortic Calcification-severe There is occlusive or near close calcified atherosclerotic plaque in the infrarenal abdominal aorta involving approximately 2 cm in length segment of the aorta. RIGHT PELVIS: Right Common Iliac Artery - Minimal Diameter-6.9 x 5.8 mm Tortuosity-mild Calcification-severe Right External Iliac Artery - Minimal Diameter-7.2 x 6.7 mm Tortuosity-mild Calcification-mild Right Common Femoral Artery - Minimal Diameter-7.8 x 6.8 mm  Tortuosity-mild Calcification-moderate LEFT PELVIS: Left Common Iliac Artery - Minimal Diameter-10.2 x 5.9 mm Tortuosity-mild Calcification-severe Left External Iliac Artery - Minimal Diameter-7.3 x 6.3 mm Tortuosity-mild Calcification-mild-to-moderate Left Common Femoral Artery - Minimal Diameter-7.0 x 3.6 mm Tortuosity-mild Calcification-moderate to severe Review of the MIP images confirms the above findings. IMPRESSION: 1. Vascular findings and measurements pertinent to potential TAVR procedure, as detailed. Note is made of focal occlusive or near occlusive calcified atherosclerotic plaque involving a 2 cm length segment of the infrarenal abdominal aorta. 2. Mild-to-moderate cardiomegaly. Three-vessel coronary atherosclerosis. 3. Diffusely thickened and calcified aortic valve, compatible with reported history of severe aortic stenosis. 4. Dilated main pulmonary artery, suggesting chronic pulmonary arterial hypertension. 5. Mild mosaic attenuation in both lungs, nonspecific, potentially due to mosaic perfusion from pulmonary vascular disease or air trapping from small airways disease. 6. Small amount of gas in the nondependent bladder lumen, correlate for history of recent bladder instrumentation. Suggestion of mild diffuse bladder wall thickening. Suggest correlation with urinalysis to exclude acute cystitis. 7. Mild colonic diverticulosis. 8. Multiple small coarsely calcified degenerated uterine fibroids. 9. Aortic Atherosclerosis (ICD10-I70.0). Electronically Signed   By: Ilona Sorrel M.D.   On: 10/13/2020 09:00   CT Angio Abd/Pel w/ and/or w/o  Result Date: 10/13/2020 CLINICAL DATA:  Inpatient with severe aortic stenosis. Pre-TAVR planning. EXAM: CT ANGIOGRAPHY CHEST, ABDOMEN AND PELVIS TECHNIQUE: Multidetector CT imaging through the chest, abdomen and pelvis was performed using the standard protocol during bolus administration of intravenous contrast. Multiplanar reconstructed images and MIPs were obtained  and reviewed to evaluate the vascular anatomy. CONTRAST:  167mL OMNIPAQUE IOHEXOL 350 MG/ML SOLN COMPARISON:  03/02/2020 chest CT. FINDINGS: CTA CHEST FINDINGS Cardiovascular: Mild-to-moderate cardiomegaly. Diffusely calcified and thickened aortic valve. No significant pericardial effusion/thickening. Right internal jugular central venous catheter terminates in the right atrium. Three-vessel coronary atherosclerosis. Atherosclerotic nonaneurysmal thoracic aorta. Dilated main pulmonary artery (3.7 cm diameter). No central pulmonary emboli. Mediastinum/Nodes: Subcentimeter hypodense bilateral thyroid nodules. Not clinically significant; no follow-up imaging recommended (ref: J Am Coll Radiol. 2015 Feb;12(2): 143-50). Unremarkable esophagus. No axillary adenopathy. No pathologically enlarged mediastinal nodes. Borderline mildly prominent bilateral hilar nodes, largest 1.0 cm on the left (series 4/image 65). Lungs/Pleura: No pneumothorax. No pleural effusion. No acute consolidative airspace disease, lung masses or significant pulmonary nodules. Mild mosaic attenuation in both lungs. Musculoskeletal: No aggressive appearing focal osseous lesions. Mild thoracic spondylosis. CTA ABDOMEN AND PELVIS FINDINGS Hepatobiliary: Normal liver with no liver mass. Normal gallbladder with no radiopaque cholelithiasis. No biliary ductal dilatation. Pancreas: Normal, with no mass or duct dilation. Spleen: Normal size. No mass. Adrenals/Urinary Tract: Normal adrenals. No contour deforming renal masses. No hydronephrosis. Bladder is nondistended. Suggestion of mild diffuse bladder wall thickening. Small amount of gas in the nondependent bladder lumen. Stomach/Bowel: Normal non-distended stomach. Subtotal right hemicolectomy with ileocolic  anastomosis in the right abdomen. No small bowel dilatation or wall thickening. Scattered mild colonic diverticulosis with no large bowel wall thickening or acute pericolonic fat stranding.  Vascular/Lymphatic: Severely atherosclerotic nonaneurysmal abdominal aorta. No pathologically enlarged lymph nodes in the abdomen or pelvis. Reproductive: Multiple small coarsely calcified degenerated uterine fibroids, largest 1.7 cm in the upper right uterine body. No adnexal masses. Other: No pneumoperitoneum, ascites or focal fluid collection. Musculoskeletal: No aggressive appearing focal osseous lesions. Moderate lumbar spondylosis. VASCULAR MEASUREMENTS PERTINENT TO TAVR: AORTA: Minimal Aortic Diameter-0 mm Severity of Aortic Calcification-severe There is occlusive or near close calcified atherosclerotic plaque in the infrarenal abdominal aorta involving approximately 2 cm in length segment of the aorta. RIGHT PELVIS: Right Common Iliac Artery - Minimal Diameter-6.9 x 5.8 mm Tortuosity-mild Calcification-severe Right External Iliac Artery - Minimal Diameter-7.2 x 6.7 mm Tortuosity-mild Calcification-mild Right Common Femoral Artery - Minimal Diameter-7.8 x 6.8 mm Tortuosity-mild Calcification-moderate LEFT PELVIS: Left Common Iliac Artery - Minimal Diameter-10.2 x 5.9 mm Tortuosity-mild Calcification-severe Left External Iliac Artery - Minimal Diameter-7.3 x 6.3 mm Tortuosity-mild Calcification-mild-to-moderate Left Common Femoral Artery - Minimal Diameter-7.0 x 3.6 mm Tortuosity-mild Calcification-moderate to severe Review of the MIP images confirms the above findings. IMPRESSION: 1. Vascular findings and measurements pertinent to potential TAVR procedure, as detailed. Note is made of focal occlusive or near occlusive calcified atherosclerotic plaque involving a 2 cm length segment of the infrarenal abdominal aorta. 2. Mild-to-moderate cardiomegaly. Three-vessel coronary atherosclerosis. 3. Diffusely thickened and calcified aortic valve, compatible with reported history of severe aortic stenosis. 4. Dilated main pulmonary artery, suggesting chronic pulmonary arterial hypertension. 5. Mild mosaic attenuation  in both lungs, nonspecific, potentially due to mosaic perfusion from pulmonary vascular disease or air trapping from small airways disease. 6. Small amount of gas in the nondependent bladder lumen, correlate for history of recent bladder instrumentation. Suggestion of mild diffuse bladder wall thickening. Suggest correlation with urinalysis to exclude acute cystitis. 7. Mild colonic diverticulosis. 8. Multiple small coarsely calcified degenerated uterine fibroids. 9. Aortic Atherosclerosis (ICD10-I70.0). Electronically Signed   By: Ilona Sorrel M.D.   On: 10/13/2020 09:00    Cardiac Studies   See above  Patient Profile     65 y.o. female with severe calcific aortic stenosis, pulmonary hypertension, moderate mitral stenosis, paroxysmal atrial fibrillation, end-stage renal disease with recent progression, epistaxis, and myelodysplastic syndrome with severe anemia.  Patient hospitalized with non-STEMI/demand ischemia in the setting of hemoglobin of 4.  Assessment & Plan    1.  Non-STEMI: Cardiac catheterization films from this morning's procedure are reviewed.  She has at least moderate calcific stenosis of the left main.  Suspect combination of left main disease, severe aortic stenosis, severe anemia, and end-stage renal disease, all contribute to the degree of enzyme elevation seen.  Symptoms have resolved with blood transfusion. 2.  Severe aortic stenosis.  Invasive hemodynamics are not as impressive as her echo gradients.  Cardiac CT is reviewed and she has a very high aortic valve calcium score.  I do think she suffers from severe aortic stenosis.  Treatment options are going to be extremely limited.  She has near occlusive calcific plaque throughout the abdominal aorta.  There is no way she can be accessed from a transfemoral approach for either balloon valvuloplasty or TAVR.  She does have a patent left subclavian artery and I will review her CT images with our multidisciplinary team to assess  whether she might be a candidate for left subclavian access for TAVR.  I am  very concerned about her comorbidities and I think TAVR will carry significant risk.  I worry about her long-term prognosis with or without treatment of her aortic stenosis.  All of this was discussed openly with the patient today and I would like to see her back in the office with her husband in about 4 to 6 weeks.  I think it is important that we review her case as a multidisciplinary heart team, and also that the patient get established with outpatient dialysis prior to reevaluation.  Her treatment options may ultimately be limited to palliative medical therapy, but will reassess when she returns for outpatient follow-up.  Other medical problems as previously outlined appear stable.  I will arrange a follow-up office visit next month.       For questions or updates, please contact [place] Please consult [email or web address] for contact info under        Signed, Sherren Mocha, MD  10/13/2020, 1:56 PM

## 2020-10-13 NOTE — Progress Notes (Signed)
Per Nephrologist, patient and family request [facility] even though it is not the closest clinic to home and do not want to be placed at [place]. Navigator has updated [employer], [place] [facility], and [facility] and will follow. Navigator had attempted again to follow up yesterday to discuss, but patient was not available.   Alphonzo Cruise, [facility] Navigator [phone]

## 2020-10-14 DIAGNOSIS — I739 Peripheral vascular disease, unspecified: Secondary | ICD-10-CM | POA: Insufficient documentation

## 2020-10-14 DIAGNOSIS — T829XXA Unspecified complication of cardiac and vascular prosthetic device, implant and graft, initial encounter: Secondary | ICD-10-CM | POA: Insufficient documentation

## 2020-10-14 DIAGNOSIS — I214 Non-ST elevation (NSTEMI) myocardial infarction: Secondary | ICD-10-CM | POA: Diagnosis not present

## 2020-10-14 DIAGNOSIS — E46 Unspecified protein-calorie malnutrition: Secondary | ICD-10-CM | POA: Insufficient documentation

## 2020-10-14 LAB — CBC
HCT: 22.2 % — ABNORMAL LOW (ref 36.0–46.0)
Hemoglobin: 7.4 g/dL — ABNORMAL LOW (ref 12.0–15.0)
MCH: 31.4 pg (ref 26.0–34.0)
MCHC: 33.3 g/dL (ref 30.0–36.0)
MCV: 94.1 fL (ref 80.0–100.0)
Platelets: 198 10*3/uL (ref 150–400)
RBC: 2.36 MIL/uL — ABNORMAL LOW (ref 3.87–5.11)
RDW: 16.7 % — ABNORMAL HIGH (ref 11.5–15.5)
WBC: 6.3 10*3/uL (ref 4.0–10.5)
nRBC: 4 % — ABNORMAL HIGH (ref 0.0–0.2)

## 2020-10-14 LAB — GLUCOSE, CAPILLARY
Glucose-Capillary: 226 mg/dL — ABNORMAL HIGH (ref 70–99)
Glucose-Capillary: 228 mg/dL — ABNORMAL HIGH (ref 70–99)
Glucose-Capillary: 132 mg/dL — ABNORMAL HIGH (ref 70–99)

## 2020-10-14 MED ORDER — AMOXICILLIN 250 MG PO CAPS
250.0000 mg | ORAL_CAPSULE | Freq: Two times a day (BID) | ORAL | Status: AC
  Administered 2020-10-14 – 2020-10-15 (×3): 250 mg via ORAL
  Filled 2020-10-14 (×3): qty 1

## 2020-10-14 MED ORDER — HEPARIN SODIUM (PORCINE) 1000 UNIT/ML IJ SOLN
INTRAMUSCULAR | Status: AC
  Administered 2020-10-14: 1000 mL via INTRAVENOUS_CENTRAL
  Filled 2020-10-14: qty 4

## 2020-10-14 NOTE — Progress Notes (Signed)
[facility] Progress Note   Assessment/Plan  Patricia Mata is an 65 y.o. female with CKD 5, HTN, AS (severe), mitral stenosis (mod/severe), gout, pulmonary HTN, DM, MDS, gout who presented with chest pain, found to have severe anemia in the 4s and is seen for AKI on CKD.   **Chest pain, severe AS: thought to be demand ischemia in setting of severe anemia and AS and now resolved.  Cardiology is following and she underwent L/RHC, findings noted - outpt discussion re valve treatment.  CP has resolved and not recurred with HD.  Avoid anemia.   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  With volume overload and I suspect uremic platelet dysfunction leading to the nose bleeds and life threatening anemia we proceeded to initiate HD.  TDC and 1st dialysis 10/11/20; tolerated well.  2nd HD 3/31.  Plan next HD today. CLIP underway - if we get word today she has a seat assignment at [facility] she can d/c from nephrology perspective; otherwise she will need to remain hospitalized until this assignment is made.  She is aware.  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  Transfuse pRBC PRN Hb < 7.  I think part of the issue lately has been epistaxis which may be exacerbated by uremic platelet dysfunction. ENT has packed nose - to remain 5d with outpt f/u.   **HTN:  Cont home medications. Well controlled currently.   **Pulmonary HTN: on sildenafil.   **A fibrillation, paroxysmal:  On amiodarone.    Will follow, call with clinical status changes.   Subjective/Interval Events:   No overnight events.  No epistaxis with nasal packing in place.  Feeling good. HD today.   Objective Vitals:   10/13/20 1734 10/13/20 1952 10/13/20 2157 10/14/20 0633  BP: (!) 143/54 (!) 155/47 (!) 142/43 (!) 142/38  Pulse:  65  61  Resp:  18  18  Temp:  98.6 F (37 C)  98.6 F (37 C)   TempSrc:  Oral  Oral  SpO2:  100%  100%  Weight:      Height:       Physical Exam Gen: awake and alert, looks comfortable in bed Eyes: anicteric ENT: MMM, L nare packing without blood Neck: supple Abd:  soft Lungs: normal WOB  GU: no foley Extr:  No edema, RUE +t/b, not mature Neuro: no asterixis, conversant Skin: no rashes  Additional Objective Labs: Basic Metabolic Panel: Recent Labs  Lab 10/10/20 0939 10/11/20 0357 10/12/20 0212 10/13/20 0807 10/13/20 0817  NA 134* 135 136 140 137  137  K 5.2* 4.2 3.8 3.5 3.8  3.8  CL 99 100 102  --   --   CO2 24 24 26   --   --   GLUCOSE 160* 161* 143*  --   --   BUN 153* 163* 88*  --   --   CREATININE 4.78* 5.15* 3.83*  --   --   CALCIUM 9.5 9.5 8.9  --   --    Liver Function Tests: Recent Labs  Lab 10/09/20 0554  AST 113*  ALT 75*  ALKPHOS 110  BILITOT 1.0  PROT 6.0*  ALBUMIN 3.1*   Recent Labs  Lab 10/09/20 0554  LIPASE 32   CBC: Recent Labs  Lab 10/09/20 1540 10/10/20 9485 10/11/20 0357 10/12/20 4627 10/13/20 0242 10/13/20 0807 10/13/20 0817 10/14/20  0207  WBC 6.7 8.0 8.8 7.4 6.0  --   --  6.3  NEUTROABS 5.5 6.6  --   --   --   --   --   --   HGB 4.6* 8.3* 7.3* 7.2* 7.3* 7.1* 7.8*  7.8* 7.4*  HCT 14.2* 23.8* 20.9* 21.0* 20.9* 21.0* 23.0*  23.0* 22.2*  MCV 97.9 90.2 89.7 92.5 91.3  --   --  94.1  PLT 196 178 191 175 165  --   --  198   Blood Culture    Component Value Date/Time   SDES  09/24/2019 1144    TRACHEAL ASPIRATE Performed at [facility] [address], [place], [place] [address]    SPECREQUEST  09/24/2019 1144    NONE Performed at [facility], [place] [address] [place], [place] [address]    CULT FEW CANDIDA ALBICANS 09/24/2019 1144   REPTSTATUS 09/27/2019 FINAL 09/24/2019 1144    Cardiac Enzymes: No results for input(s): CKTOTAL, CKMB, CKMBINDEX, TROPONINI in the last 168 hours. CBG: Recent Labs  Lab 10/12/20 2105 10/13/20 1106  10/13/20 1650 10/13/20 2153 10/14/20 0736  GLUCAP 167* 224* 114* 131* 226*   Iron Studies: No results for input(s): IRON, TIBC, TRANSFERRIN, FERRITIN in the last 72 hours. @lablastinr3 @ Studies/Results: CARDIAC CATHETERIZATION  Result Date: 10/13/2020  Mid RCA lesion is 20% stenosed.  Mid LM lesion is 60% stenosed.  Ost Cx to Prox Cx lesion is 30% stenosed.  Mid LAD lesion is 30% stenosed.  Findings: Ao = 152/38 (76) LV =  186/25 RA =  6 RV = 76/11 PA = 74/19 (43) PCW = unable to obtain Fick cardiac output/index = 9.1/4.6 PVR = 2.0 (using LVEDP) Ao sat = 96% PA sat = 67%. 67% AoV: Crossed easily with JR4 and J-wire. Peak-to-peak gradient 44mmHg  Mean gradient 32mmHG AVA 1.4cm2 Assessment: 1. Significant CAD with 60% mid LM disease. O/w non-obstructive CAD 2. EF 60% 3. Moderate to severe pulmonary HTN due to high output 4. Moderate to severe AS Plan/Discussion: Results d/w TAVR team. Given unfavorable anatomy, burden of comorbidites and need for alternative access, I suspect risks of TAVR outweigh possible benefits. Favor medical management. Glori Bickers, MD 9:15 AM   CT CORONARY MORPH W/CTA COR W/SCORE Lewanda Rife W/CM &/OR WO/CM  Addendum Date: 10/13/2020   ADDENDUM REPORT: 10/13/2020 08:09 EXAM: OVER-READ INTERPRETATION  CT CHEST The following report is an over-read performed by radiologist Dr. Samara Snide [facility], [place] on 10/13/2020. This over-read does not include interpretation of cardiac or coronary anatomy or pathology. The coronary CTA interpretation by the cardiologist is attached. COMPARISON:  03/03/2019 chest CT. FINDINGS: Please see the separate concurrent chest CT angiogram report for details. IMPRESSION: Please see the separate concurrent chest CT angiogram report for details. Electronically Signed   By: Ilona Sorrel M.D.   On: 10/13/2020 08:09   Result Date: 10/13/2020 CLINICAL DATA:  Severe Aortic Stenosis. EXAM: Cardiac TAVR CT TECHNIQUE: The patient was scanned on a  [employer]. A 120 kV retrospective scan was triggered in the descending thoracic aorta at 111 HU's. Gantry rotation speed was 250 msecs and collimation was .6 mm. No beta blockade or nitro were given. The 3D data set was reconstructed in 5% intervals of the R-R cycle. Systolic and diastolic phases were analyzed on a dedicated work station using MPR, MIP and VRT modes. The patient received 80 cc of contrast. FINDINGS: Image quality: Excellent. Noise artifact is: Limited. Valve Morphology: The  aortic valve is tricuspid. The valve is severely calcified with restricted leaflet movement in systole. There is bulky calcification of the LCC and this leaflet is immobile. Aortic Valve Calcium score: 2725 Aortic annular dimension: Phase assessed: 20% Annular area: 411 mm2 Annular perimeter: 74.4 mm Max diameter: 27.2 mm Min diameter: 20.9 mm Annular and subannular calcification: Mild annular calcification that is not protruding noted under the [place] and LCC. Optimal coplanar projection: LAO 4 CRA 0 Coronary Artery Height above Annulus: Left Main: 11.1 mm Right Coronary: 16.9 mm Sinus of Valsalva Measurements: Non-coronary: 30 mm Right-coronary: 29 mm Left-coronary: 30 mm Sinus of Valsalva Height: Non-coronary: 19.5 mm Right-coronary: 20.9 mm Left-coronary: 21.4 mm Sinotubular Junction: 27 mm with calcified circumferential plaque. Ascending Thoracic Aorta: 33 mm. Coronary Arteries: Normal coronary origin. Right dominance. The study was performed without use of NTG and is insufficient for plaque evaluation. Severe 3-vessel calcifications noted. Cardiac Morphology: Right Atrium: Right atrial size is within normal limits. A venous catheter terminates in the RA. Right Ventricle: The right ventricular cavity is within normal limits. Left Atrium: Left atrial size is normal in size. There is mixing artifact in the LAA, cannot exclude thrombus. Left Ventricle: The ventricular cavity size is within normal limits. There are no  stigmata of prior infarction. There is no abnormal filling defect. LVEF=61%. Pulmonary arteries: Dilated in size suggestive of pulmonary hypertension. No proximal filling defect. Pulmonary veins: Normal pulmonary venous drainage. Pericardium: Normal thickness with no significant effusion or calcium present. Mitral Valve: The mitral valve is normal structure with moderate annular calcification. Extra-cardiac findings: See attached radiology report for non-cardiac structures. IMPRESSION: 1. Tricuspid aortic valve with severe aortic stenosis (calcium score 2725). 2. Bulky calcifications of the LCC. 3. Mild annular calcifications. 4. Annular measurements appropriate for 23 mm Edwards Sapien 3 TAVR (411 mm2). 5. Sufficient coronary to annulus distance. 6. Optimal Fluoroscopic Angle for Delivery: LAO 4 CRA 0 7. Mixing artifact in the LAA, unable to exclude thrombus. 8. Dilated pulmonary artery suggestive of pulmonary hypertension. 9. 3-vessel coronary calcifications noted. [place] T. Audie Box, MD Electronically Signed: By: Eleonore Chiquito On: 10/13/2020 06:28   CT ANGIO CHEST AORTA W/CM & OR WO/CM  Result Date: 10/13/2020 CLINICAL DATA:  Inpatient with severe aortic stenosis. Pre-TAVR planning. EXAM: CT ANGIOGRAPHY CHEST, ABDOMEN AND PELVIS TECHNIQUE: Multidetector CT imaging through the chest, abdomen and pelvis was performed using the standard protocol during bolus administration of intravenous contrast. Multiplanar reconstructed images and MIPs were obtained and reviewed to evaluate the vascular anatomy. CONTRAST:  155mL OMNIPAQUE IOHEXOL 350 MG/ML SOLN COMPARISON:  03/02/2020 chest CT. FINDINGS: CTA CHEST FINDINGS Cardiovascular: Mild-to-moderate cardiomegaly. Diffusely calcified and thickened aortic valve. No significant pericardial effusion/thickening. Right internal jugular central venous catheter terminates in the right atrium. Three-vessel coronary atherosclerosis. Atherosclerotic nonaneurysmal thoracic aorta.  Dilated main pulmonary artery (3.7 cm diameter). No central pulmonary emboli. Mediastinum/Nodes: Subcentimeter hypodense bilateral thyroid nodules. Not clinically significant; no follow-up imaging recommended (ref: J Am Coll Radiol. 2015 Feb;12(2): 143-50). Unremarkable esophagus. No axillary adenopathy. No pathologically enlarged mediastinal nodes. Borderline mildly prominent bilateral hilar nodes, largest 1.0 cm on the left (series 4/image 65). Lungs/Pleura: No pneumothorax. No pleural effusion. No acute consolidative airspace disease, lung masses or significant pulmonary nodules. Mild mosaic attenuation in both lungs. Musculoskeletal: No aggressive appearing focal osseous lesions. Mild thoracic spondylosis. CTA ABDOMEN AND PELVIS FINDINGS Hepatobiliary: Normal liver with no liver mass. Normal gallbladder with no radiopaque cholelithiasis. No biliary ductal dilatation. Pancreas: Normal, with no mass or duct dilation.  Spleen: Normal size. No mass. Adrenals/Urinary Tract: Normal adrenals. No contour deforming renal masses. No hydronephrosis. Bladder is nondistended. Suggestion of mild diffuse bladder wall thickening. Small amount of gas in the nondependent bladder lumen. Stomach/Bowel: Normal non-distended stomach. Subtotal right hemicolectomy with ileocolic anastomosis in the right abdomen. No small bowel dilatation or wall thickening. Scattered mild colonic diverticulosis with no large bowel wall thickening or acute pericolonic fat stranding. Vascular/Lymphatic: Severely atherosclerotic nonaneurysmal abdominal aorta. No pathologically enlarged lymph nodes in the abdomen or pelvis. Reproductive: Multiple small coarsely calcified degenerated uterine fibroids, largest 1.7 cm in the upper right uterine body. No adnexal masses. Other: No pneumoperitoneum, ascites or focal fluid collection. Musculoskeletal: No aggressive appearing focal osseous lesions. Moderate lumbar spondylosis. VASCULAR MEASUREMENTS PERTINENT TO  TAVR: AORTA: Minimal Aortic Diameter-0 mm Severity of Aortic Calcification-severe There is occlusive or near close calcified atherosclerotic plaque in the infrarenal abdominal aorta involving approximately 2 cm in length segment of the aorta. RIGHT PELVIS: Right Common Iliac Artery - Minimal Diameter-6.9 x 5.8 mm Tortuosity-mild Calcification-severe Right External Iliac Artery - Minimal Diameter-7.2 x 6.7 mm Tortuosity-mild Calcification-mild Right Common Femoral Artery - Minimal Diameter-7.8 x 6.8 mm Tortuosity-mild Calcification-moderate LEFT PELVIS: Left Common Iliac Artery - Minimal Diameter-10.2 x 5.9 mm Tortuosity-mild Calcification-severe Left External Iliac Artery - Minimal Diameter-7.3 x 6.3 mm Tortuosity-mild Calcification-mild-to-moderate Left Common Femoral Artery - Minimal Diameter-7.0 x 3.6 mm Tortuosity-mild Calcification-moderate to severe Review of the MIP images confirms the above findings. IMPRESSION: 1. Vascular findings and measurements pertinent to potential TAVR procedure, as detailed. Note is made of focal occlusive or near occlusive calcified atherosclerotic plaque involving a 2 cm length segment of the infrarenal abdominal aorta. 2. Mild-to-moderate cardiomegaly. Three-vessel coronary atherosclerosis. 3. Diffusely thickened and calcified aortic valve, compatible with reported history of severe aortic stenosis. 4. Dilated main pulmonary artery, suggesting chronic pulmonary arterial hypertension. 5. Mild mosaic attenuation in both lungs, nonspecific, potentially due to mosaic perfusion from pulmonary vascular disease or air trapping from small airways disease. 6. Small amount of gas in the nondependent bladder lumen, correlate for history of recent bladder instrumentation. Suggestion of mild diffuse bladder wall thickening. Suggest correlation with urinalysis to exclude acute cystitis. 7. Mild colonic diverticulosis. 8. Multiple small coarsely calcified degenerated uterine fibroids. 9. Aortic  Atherosclerosis (ICD10-I70.0). Electronically Signed   By: Ilona Sorrel M.D.   On: 10/13/2020 09:00   CT Angio Abd/Pel w/ and/or w/o  Result Date: 10/13/2020 CLINICAL DATA:  Inpatient with severe aortic stenosis. Pre-TAVR planning. EXAM: CT ANGIOGRAPHY CHEST, ABDOMEN AND PELVIS TECHNIQUE: Multidetector CT imaging through the chest, abdomen and pelvis was performed using the standard protocol during bolus administration of intravenous contrast. Multiplanar reconstructed images and MIPs were obtained and reviewed to evaluate the vascular anatomy. CONTRAST:  153mL OMNIPAQUE IOHEXOL 350 MG/ML SOLN COMPARISON:  03/02/2020 chest CT. FINDINGS: CTA CHEST FINDINGS Cardiovascular: Mild-to-moderate cardiomegaly. Diffusely calcified and thickened aortic valve. No significant pericardial effusion/thickening. Right internal jugular central venous catheter terminates in the right atrium. Three-vessel coronary atherosclerosis. Atherosclerotic nonaneurysmal thoracic aorta. Dilated main pulmonary artery (3.7 cm diameter). No central pulmonary emboli. Mediastinum/Nodes: Subcentimeter hypodense bilateral thyroid nodules. Not clinically significant; no follow-up imaging recommended (ref: J Am Coll Radiol. 2015 Feb;12(2): 143-50). Unremarkable esophagus. No axillary adenopathy. No pathologically enlarged mediastinal nodes. Borderline mildly prominent bilateral hilar nodes, largest 1.0 cm on the left (series 4/image 65). Lungs/Pleura: No pneumothorax. No pleural effusion. No acute consolidative airspace disease, lung masses or significant pulmonary nodules. Mild mosaic attenuation in both lungs. Musculoskeletal: No  aggressive appearing focal osseous lesions. Mild thoracic spondylosis. CTA ABDOMEN AND PELVIS FINDINGS Hepatobiliary: Normal liver with no liver mass. Normal gallbladder with no radiopaque cholelithiasis. No biliary ductal dilatation. Pancreas: Normal, with no mass or duct dilation. Spleen: Normal size. No mass.  Adrenals/Urinary Tract: Normal adrenals. No contour deforming renal masses. No hydronephrosis. Bladder is nondistended. Suggestion of mild diffuse bladder wall thickening. Small amount of gas in the nondependent bladder lumen. Stomach/Bowel: Normal non-distended stomach. Subtotal right hemicolectomy with ileocolic anastomosis in the right abdomen. No small bowel dilatation or wall thickening. Scattered mild colonic diverticulosis with no large bowel wall thickening or acute pericolonic fat stranding. Vascular/Lymphatic: Severely atherosclerotic nonaneurysmal abdominal aorta. No pathologically enlarged lymph nodes in the abdomen or pelvis. Reproductive: Multiple small coarsely calcified degenerated uterine fibroids, largest 1.7 cm in the upper right uterine body. No adnexal masses. Other: No pneumoperitoneum, ascites or focal fluid collection. Musculoskeletal: No aggressive appearing focal osseous lesions. Moderate lumbar spondylosis. VASCULAR MEASUREMENTS PERTINENT TO TAVR: AORTA: Minimal Aortic Diameter-0 mm Severity of Aortic Calcification-severe There is occlusive or near close calcified atherosclerotic plaque in the infrarenal abdominal aorta involving approximately 2 cm in length segment of the aorta. RIGHT PELVIS: Right Common Iliac Artery - Minimal Diameter-6.9 x 5.8 mm Tortuosity-mild Calcification-severe Right External Iliac Artery - Minimal Diameter-7.2 x 6.7 mm Tortuosity-mild Calcification-mild Right Common Femoral Artery - Minimal Diameter-7.8 x 6.8 mm Tortuosity-mild Calcification-moderate LEFT PELVIS: Left Common Iliac Artery - Minimal Diameter-10.2 x 5.9 mm Tortuosity-mild Calcification-severe Left External Iliac Artery - Minimal Diameter-7.3 x 6.3 mm Tortuosity-mild Calcification-mild-to-moderate Left Common Femoral Artery - Minimal Diameter-7.0 x 3.6 mm Tortuosity-mild Calcification-moderate to severe Review of the MIP images confirms the above findings. IMPRESSION: 1. Vascular findings and  measurements pertinent to potential TAVR procedure, as detailed. Note is made of focal occlusive or near occlusive calcified atherosclerotic plaque involving a 2 cm length segment of the infrarenal abdominal aorta. 2. Mild-to-moderate cardiomegaly. Three-vessel coronary atherosclerosis. 3. Diffusely thickened and calcified aortic valve, compatible with reported history of severe aortic stenosis. 4. Dilated main pulmonary artery, suggesting chronic pulmonary arterial hypertension. 5. Mild mosaic attenuation in both lungs, nonspecific, potentially due to mosaic perfusion from pulmonary vascular disease or air trapping from small airways disease. 6. Small amount of gas in the nondependent bladder lumen, correlate for history of recent bladder instrumentation. Suggestion of mild diffuse bladder wall thickening. Suggest correlation with urinalysis to exclude acute cystitis. 7. Mild colonic diverticulosis. 8. Multiple small coarsely calcified degenerated uterine fibroids. 9. Aortic Atherosclerosis (ICD10-I70.0). Electronically Signed   By: Ilona Sorrel M.D.   On: 10/13/2020 09:00   Medications: . cefTRIAXone (ROCEPHIN)  IV 1 g (10/13/20 1656)   . allopurinol  100 mg Oral Daily  . amiodarone  200 mg Oral Daily  . atorvastatin  40 mg Oral Daily  . carvedilol  6.25 mg Oral BID  . Chlorhexidine Gluconate Cloth  6 each Topical Q0600  . cloNIDine  0.1 mg Oral BID  . fluticasone  1 spray Each Nare Daily  . furosemide  40 mg Oral Daily  . hydrALAZINE  100 mg Oral TID  . insulin aspart  0-9 Units Subcutaneous TID WC  . insulin glargine  20 Units Subcutaneous Daily  . latanoprost  1 drop Both Eyes QHS  . levothyroxine  150 mcg Oral QAC breakfast  . linagliptin  5 mg Oral Daily  . loratadine  10 mg Oral QODAY  . pantoprazole  40 mg Oral Daily  . polyethylene glycol  17 g Oral  Daily  . sildenafil  20 mg Oral TID  . sodium chloride flush  3 mL Intravenous Q12H        Jannifer Hick MD 10/14/2020, 7:56 AM   [facility]

## 2020-10-14 NOTE — Progress Notes (Signed)
PROGRESS NOTE    Patricia Mata  KPT:[id number] DOB: Nov 12, 1955 DOA: 10/09/2020 PCP: [name], FNP    Brief Narrative:  Patient is 65 year old female with history of paroxysmal A. fib, pulmonary hypertension, severe aortic stenosis, type 2 diabetes on insulin, severe hypertension, CKD stage V, colon cancer status post hemicolectomy, myelodysplastic syndrome and transfusion dependent anemia, chronic diastolic heart failure and epistaxis who presented to the emergency room with chest pain, found to have elevated troponins, severe aortic stenosis, fluid overload and since then admitted to the hospital and treated for following conditions.   Assessment & Plan:   Principal Problem:   NSTEMI (non-ST elevated myocardial infarction) ([place]) Active Problems:   Anemia due to chronic kidney disease   Anemia in chronic kidney disease   MDS (myelodysplastic syndrome) (HCC)   Essential hypertension   Aortic stenosis, severe   Carotid artery disease (HCC)   PAF (paroxysmal atrial fibrillation) (HCC)   Hypothyroidism   Pulmonary hypertension (HCC)   DM2 (diabetes mellitus, type 2) (HCC)   Elevated troponin   Chronic kidney disease, stage 5 (HCC)   Symptomatic anemia   Angina pectoris (HCC)   Angina at rest [facility])  Non-STEMI: Troponin peaked at 7000, EKG with global ischemia, hemoglobin 4.9.  Currently symptoms controlled. Currently remains on aspirin, beta-blockers and hydralazine. Cardiac cath on 4/1 with diffuse coronary artery disease, not favorable for TAVR as per cardiology report.  Medical management.  Severe aortic stenosis and moderate mitral stenosis: Followed by heart failure team.  CT scans and cardiac cath today not favorable for TAVR as per cardiology.  Medical management.  Tolerating dialysis so far.    ESRD /CKD stage V with fluid overload: Followed by nephrology.  Received subsequent dialysis on 3/30, 3/31, 4/2.  Paroxysmal A. fib: On normal sinus rhythm.  Currently  on amiodarone and rate controlled.  Not on anticoagulation due to recurrent severe anemia and transfusion dependent anemia as well has epistaxis.  Myelodysplastic syndrome/transfusion dependent anemia/anemia of chronic kidney disease: Continues to need transfusion.  If needed, she can be transfused with dialysis.  Hemoglobin 7.3 today.  No indication for transfusion.  Hypothyroidism: On Synthroid.  Stable.  Type 2 diabetes on insulin: Continue Lantus and sliding scale insulin.  Adequate.  Recurrent epistaxis: Had epistaxis overnight 3/30.  Cauterized and packed by ENT, removal in 5 days. Antibiotics for 5 days.  Will change to oral antibiotics.  Tolerates penicillin.  Continue to mobilize patient.  Clinically stabilizing.  She should be able to go home once outpatient dialysis chair is available.  DVT prophylaxis: Place and maintain sequential compression device Start: 10/09/20 1657   Code Status: Full code Family Communication: Husband at the bedside 4/1. Disposition Plan: Status is: Inpatient  Remains inpatient appropriate because:Ongoing diagnostic testing needed not appropriate for outpatient work up and IV treatments appropriate due to intensity of illness or inability to take PO   Dispo: The patient is from: Home              Anticipated d/c is to: Home              Patient currently is not medically stable to d/c.   Difficult to place patient No         Consultants:   Nephrology  Cardiology  Procedures:   Permacath placement 3/30  Hemodialysis 3/30  Cardiac cath 4/1  Antimicrobials:   None   Subjective: Patient seen and examined.  No overnight events.  Objective: Vitals:   10/13/20 1734  10/13/20 1952 10/13/20 2157 10/14/20 0633  BP: (!) 143/54 (!) 155/47 (!) 142/43 (!) 142/38  Pulse:  65  61  Resp:  18  18  Temp:  98.6 F (37 C)  98.6 F (37 C)  TempSrc:  Oral  Oral  SpO2:  100%  100%  Weight:      Height:        Intake/Output Summary  (Last 24 hours) at 10/14/2020 1422 Last data filed at 10/13/2020 1800 Gross per 24 hour  Intake 100 ml  Output --  Net 100 ml   Filed Weights   10/12/20 0444 10/12/20 1000 10/12/20 1245  Weight: 92.4 kg 92 kg 91 kg    Examination: General: Patient looks comfortable.  On room air.  Left naris is packed.  Dry. Cardiovascular: S1-S2 normal. Respiratory: Bilateral clear.  Right chest permacath clean and dry. Gastrointestinal: Soft and nontender.  Bowel sounds present. Ext: No edema.  No cyanosis.   Data Reviewed: I have personally reviewed following labs and imaging studies  CBC: Recent Labs  Lab 10/09/20 1540 10/10/20 0939 10/11/20 0357 10/12/20 0212 10/13/20 0242 10/13/20 0807 10/13/20 0817 10/14/20 0207  WBC 6.7 8.0 8.8 7.4 6.0  --   --  6.3  NEUTROABS 5.5 6.6  --   --   --   --   --   --   HGB 4.6* 8.3* 7.3* 7.2* 7.3* 7.1* 7.8*  7.8* 7.4*  HCT 14.2* 23.8* 20.9* 21.0* 20.9* 21.0* 23.0*  23.0* 22.2*  MCV 97.9 90.2 89.7 92.5 91.3  --   --  94.1  PLT 196 178 191 175 165  --   --  573   Basic Metabolic Panel: Recent Labs  Lab 10/09/20 0554 10/10/20 0939 10/11/20 0357 10/12/20 0212 10/13/20 0807 10/13/20 0817  NA 132* 134* 135 136 140 137  137  K 3.1* 5.2* 4.2 3.8 3.5 3.8  3.8  CL 98 99 100 102  --   --   CO2 20* 24 24 26   --   --   GLUCOSE 208* 160* 161* 143*  --   --   BUN 123* 153* 163* 88*  --   --   CREATININE 4.05* 4.78* 5.15* 3.83*  --   --   CALCIUM 9.0 9.5 9.5 8.9  --   --    GFR: Estimated Creatinine Clearance: 16 mL/min (A) (by C-G formula based on SCr of 3.83 mg/dL (H)). Liver Function Tests: Recent Labs  Lab 10/09/20 0554  AST 113*  ALT 75*  ALKPHOS 110  BILITOT 1.0  PROT 6.0*  ALBUMIN 3.1*   Recent Labs  Lab 10/09/20 0554  LIPASE 32   No results for input(s): AMMONIA in the last 168 hours. Coagulation Profile: Recent Labs  Lab 10/10/20 1052  INR 1.4*   Cardiac Enzymes: No results for input(s): CKTOTAL, CKMB, CKMBINDEX,  TROPONINI in the last 168 hours. BNP (last 3 results) No results for input(s): PROBNP in the last 8760 hours. HbA1C: No results for input(s): HGBA1C in the last 72 hours. CBG: Recent Labs  Lab 10/13/20 1106 10/13/20 1650 10/13/20 2153 10/14/20 0736 10/14/20 1147  GLUCAP 224* 114* 131* 226* 228*   Lipid Profile: No results for input(s): CHOL, HDL, LDLCALC, TRIG, CHOLHDL, LDLDIRECT in the last 72 hours. Thyroid Function Tests: No results for input(s): TSH, T4TOTAL, FREET4, T3FREE, THYROIDAB in the last 72 hours. Anemia Panel: No results for input(s): VITAMINB12, FOLATE, FERRITIN, TIBC, IRON, RETICCTPCT in the last 72 hours. Sepsis Labs: No  results for input(s): PROCALCITON, LATICACIDVEN in the last 168 hours.  Recent Results (from the past 240 hour(s))  Resp Panel by RT-PCR (Flu A&B, Covid) Nasopharyngeal Swab     Status: None   Collection Time: 10/09/20  1:37 PM   Specimen: Nasopharyngeal Swab; Nasopharyngeal(NP) swabs in vial transport medium  Result Value Ref Range Status   SARS Coronavirus 2 by RT PCR NEGATIVE NEGATIVE Final    Comment: (NOTE) SARS-CoV-2 target nucleic acids are NOT DETECTED.  The SARS-CoV-2 RNA is generally detectable in upper respiratory specimens during the acute phase of infection. The lowest concentration of SARS-CoV-2 viral copies this assay can detect is 138 copies/mL. A negative result does not preclude SARS-Cov-2 infection and should not be used as the sole basis for treatment or other patient management decisions. A negative result may occur with  improper specimen collection/handling, submission of specimen other than nasopharyngeal swab, presence of viral mutation(s) within the areas targeted by this assay, and inadequate number of viral copies(<138 copies/mL). A negative result must be combined with clinical observations, patient history, and epidemiological information. The expected result is Negative.  Fact Sheet for Patients:   EntrepreneurPulse.com.au  Fact Sheet for Healthcare Providers:  IncredibleEmployment.be  This test is no t yet approved or cleared by the [place] FDA and  has been authorized for detection and/or diagnosis of SARS-CoV-2 by FDA under an Emergency Use Authorization (EUA). This EUA will remain  in effect (meaning this test can be used) for the duration of the COVID-19 declaration under Section 564(b)(1) of the Act, 21 U.S.C.section 360bbb-3(b)(1), unless the authorization is terminated  or revoked sooner.       Influenza A by PCR NEGATIVE NEGATIVE Final   Influenza B by PCR NEGATIVE NEGATIVE Final    Comment: (NOTE) The Xpert Xpress SARS-CoV-2/FLU/RSV plus assay is intended as an aid in the diagnosis of influenza from Nasopharyngeal swab specimens and should not be used as a sole basis for treatment. Nasal washings and aspirates are unacceptable for Xpert Xpress SARS-CoV-2/FLU/RSV testing.  Fact Sheet for Patients: EntrepreneurPulse.com.au  Fact Sheet for Healthcare Providers: IncredibleEmployment.be  This test is not yet approved or cleared by the [place] FDA and has been authorized for detection and/or diagnosis of SARS-CoV-2 by FDA under an Emergency Use Authorization (EUA). This EUA will remain in effect (meaning this test can be used) for the duration of the COVID-19 declaration under Section 564(b)(1) of the Act, 21 U.S.C. section 360bbb-3(b)(1), unless the authorization is terminated or revoked.  Performed at [facility], [place] [address] [place], [place] [address]   MRSA PCR Screening     Status: None   Collection Time: 10/11/20  9:38 PM   Specimen: Nasal Mucosa; Nasopharyngeal  Result Value Ref Range Status   MRSA by PCR NEGATIVE NEGATIVE Final    Comment:        The GeneXpert MRSA Assay (FDA approved for NASAL specimens only), is one component of a comprehensive MRSA  colonization surveillance program. It is not intended to diagnose MRSA infection nor to guide or monitor treatment for MRSA infections. Performed at [facility], [place] [address], [place], [place] [address]          Radiology Studies: CARDIAC CATHETERIZATION  Result Date: 10/13/2020  Mid RCA lesion is 20% stenosed.  Mid LM lesion is 60% stenosed.  Ost Cx to Prox Cx lesion is 30% stenosed.  Mid LAD lesion is 30% stenosed.  Findings: Ao = 152/38 (76) LV =  186/25 RA =  6 RV = 76/11 PA = 74/19 (43) PCW = unable to obtain Fick cardiac output/index = 9.1/4.6 PVR = 2.0 (using LVEDP) Ao sat = 96% PA sat = 67%. 67% AoV: Crossed easily with JR4 and J-wire. Peak-to-peak gradient 63mmHg  Mean gradient 78mmHG AVA 1.4cm2 Assessment: 1. Significant CAD with 60% mid LM disease. O/w non-obstructive CAD 2. EF 60% 3. Moderate to severe pulmonary HTN due to high output 4. Moderate to severe AS Plan/Discussion: Results d/w TAVR team. Given unfavorable anatomy, burden of comorbidites and need for alternative access, I suspect risks of TAVR outweigh possible benefits. Favor medical management. Glori Bickers, MD 9:15 AM   CT CORONARY MORPH W/CTA COR W/SCORE Lewanda Rife W/CM &/OR WO/CM  Addendum Date: 10/13/2020   ADDENDUM REPORT: 10/13/2020 08:09 EXAM: OVER-READ INTERPRETATION  CT CHEST The following report is an over-read performed by radiologist Dr. Samara Snide [facility], [place] on 10/13/2020. This over-read does not include interpretation of cardiac or coronary anatomy or pathology. The coronary CTA interpretation by the cardiologist is attached. COMPARISON:  03/03/2019 chest CT. FINDINGS: Please see the separate concurrent chest CT angiogram report for details. IMPRESSION: Please see the separate concurrent chest CT angiogram report for details. Electronically Signed   By: Ilona Sorrel M.D.   On: 10/13/2020 08:09   Result Date: 10/13/2020 CLINICAL DATA:  Severe Aortic Stenosis. EXAM: Cardiac TAVR CT  TECHNIQUE: The patient was scanned on a [employer]. A 120 kV retrospective scan was triggered in the descending thoracic aorta at 111 HU's. Gantry rotation speed was 250 msecs and collimation was .6 mm. No beta blockade or nitro were given. The 3D data set was reconstructed in 5% intervals of the R-R cycle. Systolic and diastolic phases were analyzed on a dedicated work station using MPR, MIP and VRT modes. The patient received 80 cc of contrast. FINDINGS: Image quality: Excellent. Noise artifact is: Limited. Valve Morphology: The aortic valve is tricuspid. The valve is severely calcified with restricted leaflet movement in systole. There is bulky calcification of the LCC and this leaflet is immobile. Aortic Valve Calcium score: 2725 Aortic annular dimension: Phase assessed: 20% Annular area: 411 mm2 Annular perimeter: 74.4 mm Max diameter: 27.2 mm Min diameter: 20.9 mm Annular and subannular calcification: Mild annular calcification that is not protruding noted under the [place] and LCC. Optimal coplanar projection: LAO 4 CRA 0 Coronary Artery Height above Annulus: Left Main: 11.1 mm Right Coronary: 16.9 mm Sinus of Valsalva Measurements: Non-coronary: 30 mm Right-coronary: 29 mm Left-coronary: 30 mm Sinus of Valsalva Height: Non-coronary: 19.5 mm Right-coronary: 20.9 mm Left-coronary: 21.4 mm Sinotubular Junction: 27 mm with calcified circumferential plaque. Ascending Thoracic Aorta: 33 mm. Coronary Arteries: Normal coronary origin. Right dominance. The study was performed without use of NTG and is insufficient for plaque evaluation. Severe 3-vessel calcifications noted. Cardiac Morphology: Right Atrium: Right atrial size is within normal limits. A venous catheter terminates in the RA. Right Ventricle: The right ventricular cavity is within normal limits. Left Atrium: Left atrial size is normal in size. There is mixing artifact in the LAA, cannot exclude thrombus. Left Ventricle: The ventricular cavity  size is within normal limits. There are no stigmata of prior infarction. There is no abnormal filling defect. LVEF=61%. Pulmonary arteries: Dilated in size suggestive of pulmonary hypertension. No proximal filling defect. Pulmonary veins: Normal pulmonary venous drainage. Pericardium: Normal thickness with no significant effusion or calcium present. Mitral Valve: The mitral valve is normal structure with moderate annular calcification. Extra-cardiac  findings: See attached radiology report for non-cardiac structures. IMPRESSION: 1. Tricuspid aortic valve with severe aortic stenosis (calcium score 2725). 2. Bulky calcifications of the LCC. 3. Mild annular calcifications. 4. Annular measurements appropriate for 23 mm Edwards Sapien 3 TAVR (411 mm2). 5. Sufficient coronary to annulus distance. 6. Optimal Fluoroscopic Angle for Delivery: LAO 4 CRA 0 7. Mixing artifact in the LAA, unable to exclude thrombus. 8. Dilated pulmonary artery suggestive of pulmonary hypertension. 9. 3-vessel coronary calcifications noted. [place] T. Audie Box, MD Electronically Signed: By: Eleonore Chiquito On: 10/13/2020 06:28   CT ANGIO CHEST AORTA W/CM & OR WO/CM  Result Date: 10/13/2020 CLINICAL DATA:  Inpatient with severe aortic stenosis. Pre-TAVR planning. EXAM: CT ANGIOGRAPHY CHEST, ABDOMEN AND PELVIS TECHNIQUE: Multidetector CT imaging through the chest, abdomen and pelvis was performed using the standard protocol during bolus administration of intravenous contrast. Multiplanar reconstructed images and MIPs were obtained and reviewed to evaluate the vascular anatomy. CONTRAST:  126mL OMNIPAQUE IOHEXOL 350 MG/ML SOLN COMPARISON:  03/02/2020 chest CT. FINDINGS: CTA CHEST FINDINGS Cardiovascular: Mild-to-moderate cardiomegaly. Diffusely calcified and thickened aortic valve. No significant pericardial effusion/thickening. Right internal jugular central venous catheter terminates in the right atrium. Three-vessel coronary atherosclerosis.  Atherosclerotic nonaneurysmal thoracic aorta. Dilated main pulmonary artery (3.7 cm diameter). No central pulmonary emboli. Mediastinum/Nodes: Subcentimeter hypodense bilateral thyroid nodules. Not clinically significant; no follow-up imaging recommended (ref: J Am Coll Radiol. 2015 Feb;12(2): 143-50). Unremarkable esophagus. No axillary adenopathy. No pathologically enlarged mediastinal nodes. Borderline mildly prominent bilateral hilar nodes, largest 1.0 cm on the left (series 4/image 65). Lungs/Pleura: No pneumothorax. No pleural effusion. No acute consolidative airspace disease, lung masses or significant pulmonary nodules. Mild mosaic attenuation in both lungs. Musculoskeletal: No aggressive appearing focal osseous lesions. Mild thoracic spondylosis. CTA ABDOMEN AND PELVIS FINDINGS Hepatobiliary: Normal liver with no liver mass. Normal gallbladder with no radiopaque cholelithiasis. No biliary ductal dilatation. Pancreas: Normal, with no mass or duct dilation. Spleen: Normal size. No mass. Adrenals/Urinary Tract: Normal adrenals. No contour deforming renal masses. No hydronephrosis. Bladder is nondistended. Suggestion of mild diffuse bladder wall thickening. Small amount of gas in the nondependent bladder lumen. Stomach/Bowel: Normal non-distended stomach. Subtotal right hemicolectomy with ileocolic anastomosis in the right abdomen. No small bowel dilatation or wall thickening. Scattered mild colonic diverticulosis with no large bowel wall thickening or acute pericolonic fat stranding. Vascular/Lymphatic: Severely atherosclerotic nonaneurysmal abdominal aorta. No pathologically enlarged lymph nodes in the abdomen or pelvis. Reproductive: Multiple small coarsely calcified degenerated uterine fibroids, largest 1.7 cm in the upper right uterine body. No adnexal masses. Other: No pneumoperitoneum, ascites or focal fluid collection. Musculoskeletal: No aggressive appearing focal osseous lesions. Moderate lumbar  spondylosis. VASCULAR MEASUREMENTS PERTINENT TO TAVR: AORTA: Minimal Aortic Diameter-0 mm Severity of Aortic Calcification-severe There is occlusive or near close calcified atherosclerotic plaque in the infrarenal abdominal aorta involving approximately 2 cm in length segment of the aorta. RIGHT PELVIS: Right Common Iliac Artery - Minimal Diameter-6.9 x 5.8 mm Tortuosity-mild Calcification-severe Right External Iliac Artery - Minimal Diameter-7.2 x 6.7 mm Tortuosity-mild Calcification-mild Right Common Femoral Artery - Minimal Diameter-7.8 x 6.8 mm Tortuosity-mild Calcification-moderate LEFT PELVIS: Left Common Iliac Artery - Minimal Diameter-10.2 x 5.9 mm Tortuosity-mild Calcification-severe Left External Iliac Artery - Minimal Diameter-7.3 x 6.3 mm Tortuosity-mild Calcification-mild-to-moderate Left Common Femoral Artery - Minimal Diameter-7.0 x 3.6 mm Tortuosity-mild Calcification-moderate to severe Review of the MIP images confirms the above findings. IMPRESSION: 1. Vascular findings and measurements pertinent to potential TAVR procedure, as detailed. Note is made of focal  occlusive or near occlusive calcified atherosclerotic plaque involving a 2 cm length segment of the infrarenal abdominal aorta. 2. Mild-to-moderate cardiomegaly. Three-vessel coronary atherosclerosis. 3. Diffusely thickened and calcified aortic valve, compatible with reported history of severe aortic stenosis. 4. Dilated main pulmonary artery, suggesting chronic pulmonary arterial hypertension. 5. Mild mosaic attenuation in both lungs, nonspecific, potentially due to mosaic perfusion from pulmonary vascular disease or air trapping from small airways disease. 6. Small amount of gas in the nondependent bladder lumen, correlate for history of recent bladder instrumentation. Suggestion of mild diffuse bladder wall thickening. Suggest correlation with urinalysis to exclude acute cystitis. 7. Mild colonic diverticulosis. 8. Multiple small coarsely  calcified degenerated uterine fibroids. 9. Aortic Atherosclerosis (ICD10-I70.0). Electronically Signed   By: Ilona Sorrel M.D.   On: 10/13/2020 09:00   CT Angio Abd/Pel w/ and/or w/o  Result Date: 10/13/2020 CLINICAL DATA:  Inpatient with severe aortic stenosis. Pre-TAVR planning. EXAM: CT ANGIOGRAPHY CHEST, ABDOMEN AND PELVIS TECHNIQUE: Multidetector CT imaging through the chest, abdomen and pelvis was performed using the standard protocol during bolus administration of intravenous contrast. Multiplanar reconstructed images and MIPs were obtained and reviewed to evaluate the vascular anatomy. CONTRAST:  152mL OMNIPAQUE IOHEXOL 350 MG/ML SOLN COMPARISON:  03/02/2020 chest CT. FINDINGS: CTA CHEST FINDINGS Cardiovascular: Mild-to-moderate cardiomegaly. Diffusely calcified and thickened aortic valve. No significant pericardial effusion/thickening. Right internal jugular central venous catheter terminates in the right atrium. Three-vessel coronary atherosclerosis. Atherosclerotic nonaneurysmal thoracic aorta. Dilated main pulmonary artery (3.7 cm diameter). No central pulmonary emboli. Mediastinum/Nodes: Subcentimeter hypodense bilateral thyroid nodules. Not clinically significant; no follow-up imaging recommended (ref: J Am Coll Radiol. 2015 Feb;12(2): 143-50). Unremarkable esophagus. No axillary adenopathy. No pathologically enlarged mediastinal nodes. Borderline mildly prominent bilateral hilar nodes, largest 1.0 cm on the left (series 4/image 65). Lungs/Pleura: No pneumothorax. No pleural effusion. No acute consolidative airspace disease, lung masses or significant pulmonary nodules. Mild mosaic attenuation in both lungs. Musculoskeletal: No aggressive appearing focal osseous lesions. Mild thoracic spondylosis. CTA ABDOMEN AND PELVIS FINDINGS Hepatobiliary: Normal liver with no liver mass. Normal gallbladder with no radiopaque cholelithiasis. No biliary ductal dilatation. Pancreas: Normal, with no mass or duct  dilation. Spleen: Normal size. No mass. Adrenals/Urinary Tract: Normal adrenals. No contour deforming renal masses. No hydronephrosis. Bladder is nondistended. Suggestion of mild diffuse bladder wall thickening. Small amount of gas in the nondependent bladder lumen. Stomach/Bowel: Normal non-distended stomach. Subtotal right hemicolectomy with ileocolic anastomosis in the right abdomen. No small bowel dilatation or wall thickening. Scattered mild colonic diverticulosis with no large bowel wall thickening or acute pericolonic fat stranding. Vascular/Lymphatic: Severely atherosclerotic nonaneurysmal abdominal aorta. No pathologically enlarged lymph nodes in the abdomen or pelvis. Reproductive: Multiple small coarsely calcified degenerated uterine fibroids, largest 1.7 cm in the upper right uterine body. No adnexal masses. Other: No pneumoperitoneum, ascites or focal fluid collection. Musculoskeletal: No aggressive appearing focal osseous lesions. Moderate lumbar spondylosis. VASCULAR MEASUREMENTS PERTINENT TO TAVR: AORTA: Minimal Aortic Diameter-0 mm Severity of Aortic Calcification-severe There is occlusive or near close calcified atherosclerotic plaque in the infrarenal abdominal aorta involving approximately 2 cm in length segment of the aorta. RIGHT PELVIS: Right Common Iliac Artery - Minimal Diameter-6.9 x 5.8 mm Tortuosity-mild Calcification-severe Right External Iliac Artery - Minimal Diameter-7.2 x 6.7 mm Tortuosity-mild Calcification-mild Right Common Femoral Artery - Minimal Diameter-7.8 x 6.8 mm Tortuosity-mild Calcification-moderate LEFT PELVIS: Left Common Iliac Artery - Minimal Diameter-10.2 x 5.9 mm Tortuosity-mild Calcification-severe Left External Iliac Artery - Minimal Diameter-7.3 x 6.3 mm Tortuosity-mild Calcification-mild-to-moderate Left Common Femoral  Artery - Minimal Diameter-7.0 x 3.6 mm Tortuosity-mild Calcification-moderate to severe Review of the MIP images confirms the above findings.  IMPRESSION: 1. Vascular findings and measurements pertinent to potential TAVR procedure, as detailed. Note is made of focal occlusive or near occlusive calcified atherosclerotic plaque involving a 2 cm length segment of the infrarenal abdominal aorta. 2. Mild-to-moderate cardiomegaly. Three-vessel coronary atherosclerosis. 3. Diffusely thickened and calcified aortic valve, compatible with reported history of severe aortic stenosis. 4. Dilated main pulmonary artery, suggesting chronic pulmonary arterial hypertension. 5. Mild mosaic attenuation in both lungs, nonspecific, potentially due to mosaic perfusion from pulmonary vascular disease or air trapping from small airways disease. 6. Small amount of gas in the nondependent bladder lumen, correlate for history of recent bladder instrumentation. Suggestion of mild diffuse bladder wall thickening. Suggest correlation with urinalysis to exclude acute cystitis. 7. Mild colonic diverticulosis. 8. Multiple small coarsely calcified degenerated uterine fibroids. 9. Aortic Atherosclerosis (ICD10-I70.0). Electronically Signed   By: Ilona Sorrel M.D.   On: 10/13/2020 09:00        Scheduled Meds: . allopurinol  100 mg Oral Daily  . amiodarone  200 mg Oral Daily  . amoxicillin  250 mg Oral Q12H  . atorvastatin  40 mg Oral Daily  . carvedilol  6.25 mg Oral BID  . Chlorhexidine Gluconate Cloth  6 each Topical Q0600  . cloNIDine  0.1 mg Oral BID  . fluticasone  1 spray Each Nare Daily  . furosemide  40 mg Oral Daily  . hydrALAZINE  100 mg Oral TID  . insulin aspart  0-9 Units Subcutaneous TID WC  . insulin glargine  20 Units Subcutaneous Daily  . latanoprost  1 drop Both Eyes QHS  . levothyroxine  150 mcg Oral QAC breakfast  . linagliptin  5 mg Oral Daily  . loratadine  10 mg Oral QODAY  . pantoprazole  40 mg Oral Daily  . polyethylene glycol  17 g Oral Daily  . sildenafil  20 mg Oral TID  . sodium chloride flush  3 mL Intravenous Q12H   Continuous  Infusions:    LOS: 5 days    Time spent: 25 minutes    Barb Merino, MD [facility] Pager [phone]

## 2020-10-15 DIAGNOSIS — I214 Non-ST elevation (NSTEMI) myocardial infarction: Secondary | ICD-10-CM | POA: Diagnosis not present

## 2020-10-15 LAB — GLUCOSE, CAPILLARY
Glucose-Capillary: 102 mg/dL — ABNORMAL HIGH (ref 70–99)
Glucose-Capillary: 110 mg/dL — ABNORMAL HIGH (ref 70–99)
Glucose-Capillary: 231 mg/dL — ABNORMAL HIGH (ref 70–99)
Glucose-Capillary: 248 mg/dL — ABNORMAL HIGH (ref 70–99)
Glucose-Capillary: 99 mg/dL (ref 70–99)

## 2020-10-15 LAB — CBC
HCT: 22.6 % — ABNORMAL LOW (ref 36.0–46.0)
Hemoglobin: 7.7 g/dL — ABNORMAL LOW (ref 12.0–15.0)
MCH: 32.1 pg (ref 26.0–34.0)
MCHC: 34.1 g/dL (ref 30.0–36.0)
MCV: 94.2 fL (ref 80.0–100.0)
Platelets: 187 10*3/uL (ref 150–400)
RBC: 2.4 MIL/uL — ABNORMAL LOW (ref 3.87–5.11)
RDW: 17.3 % — ABNORMAL HIGH (ref 11.5–15.5)
WBC: 7.1 10*3/uL (ref 4.0–10.5)
nRBC: 1.8 % — ABNORMAL HIGH (ref 0.0–0.2)

## 2020-10-15 LAB — HEMOGLOBIN AND HEMATOCRIT, BLOOD
HCT: 25.2 % — ABNORMAL LOW (ref 36.0–46.0)
Hemoglobin: 8.3 g/dL — ABNORMAL LOW (ref 12.0–15.0)

## 2020-10-15 MED ORDER — SENNOSIDES-DOCUSATE SODIUM 8.6-50 MG PO TABS
1.0000 | ORAL_TABLET | Freq: Two times a day (BID) | ORAL | Status: DC
  Administered 2020-10-15 – 2020-10-16 (×3): 1 via ORAL
  Filled 2020-10-15 (×3): qty 1

## 2020-10-15 MED ORDER — MAGNESIUM CITRATE PO SOLN
1.0000 | Freq: Once | ORAL | Status: AC
  Administered 2020-10-15: 1 via ORAL
  Filled 2020-10-15: qty 296

## 2020-10-15 MED FILL — Heparin Sod (Porcine)-NaCl IV Soln 1000 Unit/500ML-0.9%: INTRAVENOUS | Qty: 500 | Status: AC

## 2020-10-15 NOTE — Progress Notes (Signed)
[facility] Progress Note   Assessment/Plan  Patricia Mata is an 65 y.o. female with CKD 5, HTN, AS (severe), mitral stenosis (mod/severe), gout, pulmonary HTN, DM, MDS, gout who presented with chest pain, found to have severe anemia in the 4s and is seen for AKI on CKD.   **Chest pain, severe AS: thought to be demand ischemia in setting of severe anemia and AS and now resolved.  Cardiology is following and she underwent L/RHC, findings noted - outpt discussion re valve treatment.  CP has resolved and not recurred with HD.  Avoid anemia.   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  Initiated HD 3/30 for uremia. Tol tx well.  Last HD 4/2.  CLIP underway - if we get word today she has a seat assignment at [facility] she can d/c from nephrology perspective; otherwise she will need to remain hospitalized until this assignment is made.  She is aware.  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  Transfuse pRBC PRN Hb < 7.  I think part of the issue lately has been epistaxis which may be exacerbated by uremic platelet dysfunction. ENT has packed nose - to remain 5d with outpt f/u.   **HTN:  Cont home medications. Well controlled currently.   **Pulmonary HTN: on sildenafil.   **A fibrillation, paroxysmal:  On amiodarone.    Will follow, call with clinical status changes.   Subjective/Interval Events:   No overnight events.  No epistaxis with nasal packing in place. Had hematoma at venipuncture site overnight, sore.  Otherwise HD went fine yesterday - UF 1L.   Objective Vitals:   10/14/20 2243 10/14/20 2353 10/15/20 0307 10/15/20 0554  BP: (!) 150/56 (!) 153/60  (!) 133/58  Pulse: 63   63  Resp: 20   20  Temp: 98.6 F (37 C)   98.5 F (36.9 C)  TempSrc: Oral Oral  Oral  SpO2:    98%  Weight:   93.5 kg   Height:       Physical Exam Gen: awake and  alert, looks comfortable in bed Eyes: anicteric ENT: MMM, L nare packing without blood Neck: supple Abd:  soft Lungs: normal WOB  GU: no foley Extr:  No edema, RUE +t/b, not mature; L forearm hematoma noted at venipuncture site Neuro: no asterixis, conversant Skin: no rashes  Additional Objective Labs: Basic Metabolic Panel: Recent Labs  Lab 10/10/20 0939 10/11/20 0357 10/12/20 0212 10/13/20 0807 10/13/20 0817  NA 134* 135 136 140 137  137  K 5.2* 4.2 3.8 3.5 3.8  3.8  CL 99 100 102  --   --   CO2 24 24 26   --   --   GLUCOSE 160* 161* 143*  --   --   BUN 153* 163* 88*  --   --   CREATININE 4.78* 5.15* 3.83*  --   --   CALCIUM 9.5 9.5 8.9  --   --    Liver Function Tests: Recent Labs  Lab 10/09/20 0554  AST 113*  ALT 75*  ALKPHOS 110  BILITOT 1.0  PROT 6.0*  ALBUMIN 3.1*   Recent Labs  Lab 10/09/20 0554  LIPASE 32   CBC: Recent Labs  Lab 10/09/20 1540 10/10/20 0939 10/11/20 0357 10/12/20 0212 10/13/20 0242 10/13/20 0807 10/13/20 0817 10/14/20 0207 10/15/20 0242  WBC 6.7 8.0 8.8  7.4 6.0  --   --  6.3 7.1  NEUTROABS 5.5 6.6  --   --   --   --   --   --   --   HGB 4.6* 8.3* 7.3* 7.2* 7.3*   < > 7.8*  7.8* 7.4* 7.7*  HCT 14.2* 23.8* 20.9* 21.0* 20.9*   < > 23.0*  23.0* 22.2* 22.6*  MCV 97.9 90.2 89.7 92.5 91.3  --   --  94.1 94.2  PLT 196 178 191 175 165  --   --  198 187   < > = values in this interval not displayed.   Blood Culture    Component Value Date/Time   SDES  09/24/2019 1144    TRACHEAL ASPIRATE Performed at [facility], [place] [address], [place], [place] [id number]    SPECREQUEST  09/24/2019 1144    NONE Performed at [facility], [place] [address], [place], [place] [address]    CULT FEW CANDIDA ALBICANS 09/24/2019 1144   REPTSTATUS 09/27/2019 FINAL 09/24/2019 1144    Cardiac Enzymes: No results for input(s): CKTOTAL, CKMB, CKMBINDEX, TROPONINI in the last 168 hours. CBG: Recent Labs  Lab  10/14/20 0736 10/14/20 1147 10/14/20 1612 10/15/20 0025 10/15/20 0728  GLUCAP 226* 228* 132* 99 102*   Iron Studies: No results for input(s): IRON, TIBC, TRANSFERRIN, FERRITIN in the last 72 hours. @lablastinr3 @ Studies/Results: No results found. Medications:  . allopurinol  100 mg Oral Daily  . amiodarone  200 mg Oral Daily  . atorvastatin  40 mg Oral Daily  . carvedilol  6.25 mg Oral BID  . Chlorhexidine Gluconate Cloth  6 each Topical Q0600  . cloNIDine  0.1 mg Oral BID  . fluticasone  1 spray Each Nare Daily  . furosemide  40 mg Oral Daily  . hydrALAZINE  100 mg Oral TID  . insulin aspart  0-9 Units Subcutaneous TID WC  . insulin glargine  20 Units Subcutaneous Daily  . latanoprost  1 drop Both Eyes QHS  . levothyroxine  150 mcg Oral QAC breakfast  . linagliptin  5 mg Oral Daily  . loratadine  10 mg Oral QODAY  . pantoprazole  40 mg Oral Daily  . polyethylene glycol  17 g Oral Daily  . senna-docusate  1 tablet Oral BID  . sildenafil  20 mg Oral TID  . sodium chloride flush  3 mL Intravenous Q12H        Jannifer Hick MD 10/15/2020, 10:52 AM  [facility]

## 2020-10-15 NOTE — Progress Notes (Signed)
Patient seen and examined.  No new events.  She had some minor bleeding after blood draw stopped with pressure.  No bowel movement for last 6 days and wanting to use a stronger laxatives.  1 dose of magnesium citrate, no ongoing use with ESRD. Increased dose of Senokot.  MiraLAX daily. Patient is stable to discharge.  Waiting for outpatient dialysis center.  General: Looks comfortable.  Slightly anxious with overnight events.  Left nostril packed, no blood. Cardiovascular: S1-S2 normal. Respiratory: Right chest permacath dry. Gastrointestinal: Soft and nontender. Ext: No edema.  Total time spent: 15 minutes

## 2020-10-15 NOTE — Progress Notes (Signed)
Patient's son Deporres came to nurses station to speak with charge RN.  He expressed concerns about his mother's care on nightshift(last PM).  He stated that the patient began to bleed after having labs drawn and called the desk for assistance, the patient then had to yell for help as no one came to assist.    Assessment of situation:  H:  7.4 on (10/14/20) then at time of lab draw last PM 7.7(10/15/20), bleeding occurrence occurred after this draw.  RN:  Dannielle Karvonen  phlebotomy Tech:  Juanell Fairly   Dayshift RN(10/15/20):  Leveda Anna, RN--states the bleeding episode was reported this AM during shift change.    Charge RN to bedside to discuss situation with both patient and son.  Patient states she had labs drawn and shortly after noticed her arm felt wet, looked down and noticed there was blood.  She pressed call bell for assistance but saw more blood so she began to call out/ yell out for help.  At this point help arrived.  Patient states it took about 2-3 minutes for assistance to arrive.  Patient states pressure was held for 5-10 minutes and new dressing was applied.  Patient and son are requesting new H&H as well as a different lab tech to draw labs going forward.    Dr. Sloan Leiter notified via [place].  Ok received to enter order for H&H.    1300: patient and son updated, lab at bedside to draw H&H  Patient and son expressed not further needs at this time.

## 2020-10-15 NOTE — Progress Notes (Signed)
Patient and Son at the bedside updated on Hgb 8.3 result. ([rior Hgb 7.7 at 0242 ) Verbalized understanding and very appreciative of follow up Lab. Leveda Anna, BSN, RN

## 2020-10-16 ENCOUNTER — Encounter: Payer: Self-pay | Admitting: General Practice

## 2020-10-16 DIAGNOSIS — D689 Coagulation defect, unspecified: Secondary | ICD-10-CM | POA: Insufficient documentation

## 2020-10-16 DIAGNOSIS — I214 Non-ST elevation (NSTEMI) myocardial infarction: Secondary | ICD-10-CM | POA: Diagnosis not present

## 2020-10-16 LAB — GLUCOSE, CAPILLARY: Glucose-Capillary: 71 mg/dL (ref 70–99)

## 2020-10-16 MED ORDER — SENNOSIDES-DOCUSATE SODIUM 8.6-50 MG PO TABS: 1.0000 | ORAL_TABLET | Freq: Two times a day (BID) | ORAL | 0 refills | Status: AC

## 2020-10-16 MED ORDER — POLYETHYLENE GLYCOL 3350 17 G PO PACK: 17.0000 g | PACK | Freq: Every day | ORAL | 0 refills | Status: DC

## 2020-10-16 NOTE — Discharge Summary (Signed)
Physician Discharge Summary  Patricia Mata RFF:[id number] DOB: [birth year] DOA: 10/09/2020  PCP: [name], FNP  Admit date: 10/09/2020 Discharge date: 10/16/2020  Admitted From: Home Disposition: Home with outpatient dialysis  Recommendations for Outpatient Follow-up:  1. Go to dialysis center tomorrow as a scheduled.  Home Health: Not applicable Equipment/Devices: None needed  Discharge Condition: Stable CODE STATUS: Full code Diet recommendation: Low-salt, low-carb diet  Discharge summary: Patient is 65 year old female with history of paroxysmal A. fib, pulmonary hypertension, severe aortic stenosis, type 2 diabetes on insulin, severe hypertension, CKD stage V, colon cancer status post hemicolectomy, myelodysplastic syndrome and transfusion dependent anemia, chronic diastolic heart failure and epistaxis who presented to the emergency room with chest pain, found to have elevated troponins, severe aortic stenosis, fluid overload and since then admitted to the hospital and treated with dialysis, cardiac catheterization.  # Non-STEMI: Troponin peaked at 7000, EKG with global ischemia, hemoglobin 4.9.  Currently symptoms controlled. Currently remains on beta-blockers and hydralazine. Cardiac cath on 4/1 with diffuse coronary artery disease, not favorable for TAVR as per cardiology report.  Medical management. Unable to tolerate aspirin due to ongoing epistaxis and blood loss anemia.  # Severe aortic stenosis and moderate mitral stenosis: Followed by heart failure team.  CT scans and cardiac cath not favorable for TAVR as per cardiology.  Medical management.  Tolerating dialysis so far.  Cardiology will schedule outpatient follow-up.   # ESRD /CKD stage V with fluid overload: Followed by nephrology.  Received subsequent dialysis on 3/30, 3/31, 4/2. Next dialysis tomorrow as outpatient.  Has a chair.  # Paroxysmal A. fib: On normal sinus rhythm.  Currently on amiodarone and  rate controlled.  Not on anticoagulation due to recurrent severe anemia and transfusion dependent anemia as well has epistaxis.  # Myelodysplastic syndrome/transfusion dependent anemia/anemia of chronic kidney disease: Continues to need transfusion.  If needed, she can be transfused with dialysis.  Hemoglobin more than 7.  No indication for transfusion.  # Hypothyroidism: On Synthroid.  Stable.  # Type 2 diabetes on insulin: Continue Lantus and sliding scale insulin.  Adequate.  # Recurrent epistaxis: Had epistaxis overnight 3/30.  Cauterized and packed by ENT Dr. Constance Holster. Removed left nostril packing before discharge.  No evidence of bleeding.  Received 5 days of antibiotics for prophylaxis. Patient may benefit with follow-up with ENT, possible cauterization.  We will send a referral for subsequent follow-up with ENT.  Patient is medically stable.  She is scheduled for dialysis tomorrow.  Able to go home today.    Discharge Diagnoses:  Principal Problem:   NSTEMI (non-ST elevated myocardial infarction) ([place]) Active Problems:   Anemia due to chronic kidney disease   Anemia in chronic kidney disease   MDS (myelodysplastic syndrome) (H[facility])   Essential hypertension   Aortic stenosis, severe   Carotid artery disease (H[facility])   PAF (paroxysmal atrial fibrillation) (H[facility])   Hypothyroidism   Pulmonary hypertension (H[facility])   DM2 (diabetes mellitus, type 2) (H[facility])   Elevated troponin   Chronic kidney disease, stage 5 (HCC)   Symptomatic anemia   Angina pectoris ([place])   Angina at rest [facility])    Discharge Instructions  Discharge Instructions    Ambulatory referral to ENT   Complete by: As directed    Recurrent Epistaxis , seen by Dr Constance Holster in the hospital.   Call MD for:  difficulty breathing, headache or visual disturbances   Complete by: As directed    Call MD for:  extreme fatigue  Complete by: As directed    Diet - low sodium heart healthy   Complete by: As directed    Diet Carb  Modified   Complete by: As directed    Increase activity slowly   Complete by: As directed    No wound care   Complete by: As directed      Allergies as of 10/16/2020      Reactions   Ancef [cefazolin] Itching   Severe itching- after procedure, ancef was the antibiotic.-04/02/17 Tolerates penicillins   Lactose Intolerance (gi) Diarrhea   Sulfa Antibiotics Rash, Other (See Comments)   Blisters, also      Medication List    STOP taking these medications   carvedilol 6.25 MG tablet Commonly known as: COREG     TAKE these medications   allopurinol 300 MG tablet Commonly known as: ZYLOPRIM Take 300 mg by mouth daily.   amiodarone 200 MG tablet Commonly known as: PACERONE Take 1 tablet (200 mg total) by mouth daily.   atorvastatin 40 MG tablet Commonly known as: LIPITOR Take 1 tablet by mouth once daily   Basaglar KwikPen 100 UNIT/ML Inject 20 Units into the skin daily.   blood glucose meter kit and supplies Dispense based on patient and insurance preference. Use up to four times daily as directed. (FOR ICD-10 E10.9, E11.9).   Centrum Silver 50+Women Tabs Take 1 tablet by mouth daily.   cloNIDine 0.1 MG tablet Commonly known as: Catapres Take 1 tablet (0.1 mg total) by mouth 2 (two) times daily.   fluticasone 50 MCG/ACT nasal spray Commonly known as: FLONASE Place 1 spray into both nostrils daily.   furosemide 40 MG tablet Commonly known as: LASIX Take 1 tablet (40 mg total) by mouth daily. Take 80 mg in AM and 40 mg in PM (4PM)   glucose blood test strip check blood sugars twice daily Dx code E11.22   hydrALAZINE 100 MG tablet Commonly known as: APRESOLINE Take 1 tablet (100 mg total) by mouth 3 (three) times daily.   Insulin Pen Needle 29G X 5MM Misc Use as directed   latanoprost 0.005 % ophthalmic solution Commonly known as: XALATAN Place 1 drop into both eyes at bedtime.   levothyroxine 150 MCG tablet Commonly known as: SYNTHROID Take 1 tablet  (150 mcg total) by mouth daily before breakfast.   linagliptin 5 MG Tabs tablet Commonly known as: Tradjenta Take 1 tablet (5 mg total) by mouth daily.   loratadine 10 MG tablet Commonly known as: CLARITIN Take 10 mg by mouth daily.   omeprazole 40 MG capsule Commonly known as: PRILOSEC Take 1 capsule (40 mg total) by mouth 2 (two) times daily before a meal.   OneTouch Delica Plus [id number] Misc check blood sugars twice daily Dx code: E11.22   OneTouch UltraLink w/Device Kit Check blood sugars twice daily E11.9   polyethylene glycol 17 g packet Commonly known as: MIRALAX / GLYCOLAX Take 17 g by mouth daily.   senna-docusate 8.6-50 MG tablet Commonly known as: Senokot-S Take 1 tablet by mouth 2 (two) times daily.   sildenafil 20 MG tablet Commonly known as: REVATIO Take 1 tablet (20 mg total) by mouth 3 (three) times daily.   sodium bicarbonate 650 MG tablet Take 650 mg by mouth 2 (two) times daily.   sodium chloride 0.65 % nasal spray Commonly known as: OCEAN Place 1 spray into the nose in the morning, at noon, in the evening, and at bedtime.       Allergies  Allergen Reactions  . Ancef [Cefazolin] Itching    Severe itching- after procedure, ancef was the antibiotic.-04/02/17 Tolerates penicillins  . Lactose Intolerance (Gi) Diarrhea  . Sulfa Antibiotics Rash and Other (See Comments)    Blisters, also    Consultations:  Cardiology  Nephrology  ENT   Procedures/Studies: CARDIAC CATHETERIZATION  Result Date: 10/13/2020  Mid RCA lesion is 20% stenosed.  Mid LM lesion is 60% stenosed.  Ost Cx to Prox Cx lesion is 30% stenosed.  Mid LAD lesion is 30% stenosed.  Findings: Ao = 152/38 (76) LV =  186/25 RA =  6 RV = 76/11 PA = 74/19 (43) PCW = unable to obtain Fick cardiac output/index = 9.1/4.6 PVR = 2.0 (using LVEDP) Ao sat = 96% PA sat = 67%. 67% AoV: Crossed easily with JR4 and J-wire. Peak-to-peak gradient 64mHg  Mean gradient 354mG AVA 1.4cm2  Assessment: 1. Significant CAD with 60% mid LM disease. O/w non-obstructive CAD 2. EF 60% 3. Moderate to severe pulmonary HTN due to high output 4. Moderate to severe AS Plan/Discussion: Results d/w TAVR team. Given unfavorable anatomy, burden of comorbidites and need for alternative access, I suspect risks of TAVR outweigh possible benefits. Favor medical management. DaGlori BickersMD 9:15 AM   IR Fluoro Guide CV Line Right  Result Date: 10/11/2020 INDICATION: [age]ear old female requiring initiation of hemodialysis due to malfunctioning upper extremity fistula. EXAM: TUNNELED CENTRAL VENOUS HEMODIALYSIS CATHETER PLACEMENT WITH ULTRASOUND AND FLUOROSCOPIC GUIDANCE MEDICATIONS: Vancomycin 1 gm IV . The antibiotic was given in an appropriate time interval prior to skin puncture. ANESTHESIA/SEDATION: Moderate (conscious) sedation was employed during this procedure. A total of Versed 1 mg and Fentanyl 25 mcg was administered intravenously. Moderate Sedation Time: 10 minutes. The patient's level of consciousness and vital signs were monitored continuously by radiology nursing throughout the procedure under my direct supervision. FLUOROSCOPY TIME:  0 minutes 6 seconds (1 mGy). COMPLICATIONS: None immediate. PROCEDURE: Informed written consent was obtained from the patient after a discussion of the risks, benefits, and alternatives to treatment. Questions regarding the procedure were encouraged and answered. The right neck and chest were prepped with chlorhexidine in a sterile fashion, and a sterile drape was applied covering the operative field. Maximum barrier sterile technique with sterile gowns and gloves were used for the procedure. A timeout was performed prior to the initiation of the procedure. After creating a small venotomy incision, a 21 gauge micropuncture kit was utilized to access the internal jugular vein. Real-time ultrasound guidance was utilized for vascular access including the acquisition of a  permanent ultrasound image documenting patency of the accessed vessel. A Wholey wire was advanced to the level of the IVC and the micropuncture sheath was exchanged for an 8 Fr dilator. A 14.5 French tunneled hemodialysis catheter measuring 23 cm from tip to cuff was tunneled in a retrograde fashion from the anterior chest wall to the venotomy incision. Serial dilation was then performed an a peel-away sheath was placed. The catheter was then placed through the peel-away sheath with the catheter tip ultimately positioned within the right atrium. Final catheter positioning was confirmed and documented with a spot radiographic image. The catheter aspirates and flushes normally. The catheter was flushed with appropriate volume heparin dwells. The catheter exit site was secured with a 0-Prolene retention suture. The venotomy incision was closed with Dermabond. Sterile dressings were applied. The patient tolerated the procedure well without immediate post procedural complication. IMPRESSION: Successful placement of 23 cm tip to cuff tunneled hemodialysis catheter  via the right internal jugular vein with catheter tip terminating within the right atrium. The catheter is ready for immediate use. Ruthann Cancer, MD [facility] Electronically Signed   By: Ruthann Cancer MD   On: 10/11/2020 12:35   IR US Guide Vasc Access Right  Result Date: 10/11/2020 INDICATION: 65 year old female requiring initiation of hemodialysis due to malfunctioning upper extremity fistula. EXAM: TUNNELED CENTRAL VENOUS HEMODIALYSIS CATHETER PLACEMENT WITH ULTRASOUND AND FLUOROSCOPIC GUIDANCE MEDICATIONS: Vancomycin 1 gm IV . The antibiotic was given in an appropriate time interval prior to skin puncture. ANESTHESIA/SEDATION: Moderate (conscious) sedation was employed during this procedure. A total of Versed 1 mg and Fentanyl 25 mcg was administered intravenously. Moderate Sedation Time: 10  minutes. The patient's level of consciousness and vital signs were monitored continuously by radiology nursing throughout the procedure under my direct supervision. FLUOROSCOPY TIME:  0 minutes 6 seconds (1 mGy). COMPLICATIONS: None immediate. PROCEDURE: Informed written consent was obtained from the patient after a discussion of the risks, benefits, and alternatives to treatment. Questions regarding the procedure were encouraged and answered. The right neck and chest were prepped with chlorhexidine in a sterile fashion, and a sterile drape was applied covering the operative field. Maximum barrier sterile technique with sterile gowns and gloves were used for the procedure. A timeout was performed prior to the initiation of the procedure. After creating a small venotomy incision, a 21 gauge micropuncture kit was utilized to access the internal jugular vein. Real-time ultrasound guidance was utilized for vascular access including the acquisition of a permanent ultrasound image documenting patency of the accessed vessel. A Wholey wire was advanced to the level of the IVC and the micropuncture sheath was exchanged for an 8 Fr dilator. A 14.5 French tunneled hemodialysis catheter measuring 23 cm from tip to cuff was tunneled in a retrograde fashion from the anterior chest wall to the venotomy incision. Serial dilation was then performed an a peel-away sheath was placed. The catheter was then placed through the peel-away sheath with the catheter tip ultimately positioned within the right atrium. Final catheter positioning was confirmed and documented with a spot radiographic image. The catheter aspirates and flushes normally. The catheter was flushed with appropriate volume heparin dwells. The catheter exit site was secured with a 0-Prolene retention suture. The venotomy incision was closed with Dermabond. Sterile dressings were applied. The patient tolerated the procedure well without immediate post procedural  complication. IMPRESSION: Successful placement of 23 cm tip to cuff tunneled hemodialysis catheter via the right internal jugular vein with catheter tip terminating within the right atrium. The catheter is ready for immediate use. Ruthann Cancer, MD [facility] Electronically Signed   By: Ruthann Cancer MD   On: 10/11/2020 12:35   CT CORONARY MORPH W/CTA COR W/SCORE Lewanda Rife W/CM &/OR WO/CM  Addendum Date: 10/13/2020   ADDENDUM REPORT: 10/13/2020 08:09 EXAM: OVER-READ INTERPRETATION  CT CHEST The following report is an over-read performed by radiologist Dr. Samara Snide [facility], [place] on 10/13/2020. This over-read does not include interpretation of cardiac or coronary anatomy or pathology. The coronary CTA interpretation by the cardiologist is attached. COMPARISON:  03/03/2019 chest CT. FINDINGS: Please see the separate concurrent chest CT angiogram report for details. IMPRESSION: Please see the separate concurrent chest CT angiogram report for details. Electronically Signed   By: Ilona Sorrel M.D.   On: 10/13/2020 08:09   Result Date: 10/13/2020 CLINICAL DATA:  Severe Aortic Stenosis. EXAM: Cardiac  TAVR CT TECHNIQUE: The patient was scanned on a [employer]. A 120 kV retrospective scan was triggered in the descending thoracic aorta at 111 HU's. Gantry rotation speed was 250 msecs and collimation was .6 mm. No beta blockade or nitro were given. The 3D data set was reconstructed in 5% intervals of the R-R cycle. Systolic and diastolic phases were analyzed on a dedicated work station using MPR, MIP and VRT modes. The patient received 80 cc of contrast. FINDINGS: Image quality: Excellent. Noise artifact is: Limited. Valve Morphology: The aortic valve is tricuspid. The valve is severely calcified with restricted leaflet movement in systole. There is bulky calcification of the LCC and this leaflet is immobile. Aortic Valve Calcium score: 2725  Aortic annular dimension: Phase assessed: 20% Annular area: 411 mm2 Annular perimeter: 74.4 mm Max diameter: 27.2 mm Min diameter: 20.9 mm Annular and subannular calcification: Mild annular calcification that is not protruding noted under the [place] and LCC. Optimal coplanar projection: LAO 4 CRA 0 Coronary Artery Height above Annulus: Left Main: 11.1 mm Right Coronary: 16.9 mm Sinus of Valsalva Measurements: Non-coronary: 30 mm Right-coronary: 29 mm Left-coronary: 30 mm Sinus of Valsalva Height: Non-coronary: 19.5 mm Right-coronary: 20.9 mm Left-coronary: 21.4 mm Sinotubular Junction: 27 mm with calcified circumferential plaque. Ascending Thoracic Aorta: 33 mm. Coronary Arteries: Normal coronary origin. Right dominance. The study was performed without use of NTG and is insufficient for plaque evaluation. Severe 3-vessel calcifications noted. Cardiac Morphology: Right Atrium: Right atrial size is within normal limits. A venous catheter terminates in the RA. Right Ventricle: The right ventricular cavity is within normal limits. Left Atrium: Left atrial size is normal in size. There is mixing artifact in the LAA, cannot exclude thrombus. Left Ventricle: The ventricular cavity size is within normal limits. There are no stigmata of prior infarction. There is no abnormal filling defect. LVEF=61%. Pulmonary arteries: Dilated in size suggestive of pulmonary hypertension. No proximal filling defect. Pulmonary veins: Normal pulmonary venous drainage. Pericardium: Normal thickness with no significant effusion or calcium present. Mitral Valve: The mitral valve is normal structure with moderate annular calcification. Extra-cardiac findings: See attached radiology report for non-cardiac structures. IMPRESSION: 1. Tricuspid aortic valve with severe aortic stenosis (calcium score 2725). 2. Bulky calcifications of the LCC. 3. Mild annular calcifications. 4. Annular measurements appropriate for 23 mm Edwards Sapien 3 TAVR (411 mm2). 5.  Sufficient coronary to annulus distance. 6. Optimal Fluoroscopic Angle for Delivery: LAO 4 CRA 0 7. Mixing artifact in the LAA, unable to exclude thrombus. 8. Dilated pulmonary artery suggestive of pulmonary hypertension. 9. 3-vessel coronary calcifications noted. [place] T. Audie Box, MD Electronically Signed: By: Eleonore Chiquito On: 10/13/2020 06:28   DG Chest Port 1 View  Result Date: 10/09/2020 CLINICAL DATA:  Chest pain EXAM: PORTABLE CHEST 1 VIEW COMPARISON:  07/06/2020 FINDINGS: Cardiomegaly and hilar vascular enlargement which is stable. No Kerley lines, effusion, or pneumothorax. No air bronchogram. Artifact from EKG leads. IMPRESSION: Cardiomegaly and vascular congestion. Electronically Signed   By: Monte Fantasia M.D.   On: 10/09/2020 04:12   CT ANGIO CHEST AORTA W/CM & OR WO/CM  Result Date: 10/13/2020 CLINICAL DATA:  Inpatient with severe aortic stenosis. Pre-TAVR planning. EXAM: CT ANGIOGRAPHY CHEST, ABDOMEN AND PELVIS TECHNIQUE: Multidetector CT imaging through the chest, abdomen and pelvis was performed using the standard protocol during bolus administration of intravenous contrast. Multiplanar reconstructed images and MIPs were obtained and reviewed to evaluate the vascular anatomy. CONTRAST:  172m OMNIPAQUE IOHEXOL 350 MG/ML SOLN COMPARISON:  03/02/2020 chest CT. FINDINGS: CTA CHEST FINDINGS Cardiovascular: Mild-to-moderate cardiomegaly. Diffusely calcified and thickened aortic valve. No significant pericardial effusion/thickening. Right internal jugular central venous catheter terminates in the right atrium. Three-vessel coronary atherosclerosis. Atherosclerotic nonaneurysmal thoracic aorta. Dilated main pulmonary artery (3.7 cm diameter). No central pulmonary emboli. Mediastinum/Nodes: Subcentimeter hypodense bilateral thyroid nodules. Not clinically significant; no follow-up imaging recommended (ref: J Am Coll Radiol. 2015 Feb;12(2): 143-50). Unremarkable esophagus. No axillary adenopathy.  No pathologically enlarged mediastinal nodes. Borderline mildly prominent bilateral hilar nodes, largest 1.0 cm on the left (series 4/image 65). Lungs/Pleura: No pneumothorax. No pleural effusion. No acute consolidative airspace disease, lung masses or significant pulmonary nodules. Mild mosaic attenuation in both lungs. Musculoskeletal: No aggressive appearing focal osseous lesions. Mild thoracic spondylosis. CTA ABDOMEN AND PELVIS FINDINGS Hepatobiliary: Normal liver with no liver mass. Normal gallbladder with no radiopaque cholelithiasis. No biliary ductal dilatation. Pancreas: Normal, with no mass or duct dilation. Spleen: Normal size. No mass. Adrenals/Urinary Tract: Normal adrenals. No contour deforming renal masses. No hydronephrosis. Bladder is nondistended. Suggestion of mild diffuse bladder wall thickening. Small amount of gas in the nondependent bladder lumen. Stomach/Bowel: Normal non-distended stomach. Subtotal right hemicolectomy with ileocolic anastomosis in the right abdomen. No small bowel dilatation or wall thickening. Scattered mild colonic diverticulosis with no large bowel wall thickening or acute pericolonic fat stranding. Vascular/Lymphatic: Severely atherosclerotic nonaneurysmal abdominal aorta. No pathologically enlarged lymph nodes in the abdomen or pelvis. Reproductive: Multiple small coarsely calcified degenerated uterine fibroids, largest 1.7 cm in the upper right uterine body. No adnexal masses. Other: No pneumoperitoneum, ascites or focal fluid collection. Musculoskeletal: No aggressive appearing focal osseous lesions. Moderate lumbar spondylosis. VASCULAR MEASUREMENTS PERTINENT TO TAVR: AORTA: Minimal Aortic Diameter-0 mm Severity of Aortic Calcification-severe There is occlusive or near close calcified atherosclerotic plaque in the infrarenal abdominal aorta involving approximately 2 cm in length segment of the aorta. RIGHT PELVIS: Right Common Iliac Artery - Minimal Diameter-6.9 x  5.8 mm Tortuosity-mild Calcification-severe Right External Iliac Artery - Minimal Diameter-7.2 x 6.7 mm Tortuosity-mild Calcification-mild Right Common Femoral Artery - Minimal Diameter-7.8 x 6.8 mm Tortuosity-mild Calcification-moderate LEFT PELVIS: Left Common Iliac Artery - Minimal Diameter-10.2 x 5.9 mm Tortuosity-mild Calcification-severe Left External Iliac Artery - Minimal Diameter-7.3 x 6.3 mm Tortuosity-mild Calcification-mild-to-moderate Left Common Femoral Artery - Minimal Diameter-7.0 x 3.6 mm Tortuosity-mild Calcification-moderate to severe Review of the MIP images confirms the above findings. IMPRESSION: 1. Vascular findings and measurements pertinent to potential TAVR procedure, as detailed. Note is made of focal occlusive or near occlusive calcified atherosclerotic plaque involving a 2 cm length segment of the infrarenal abdominal aorta. 2. Mild-to-moderate cardiomegaly. Three-vessel coronary atherosclerosis. 3. Diffusely thickened and calcified aortic valve, compatible with reported history of severe aortic stenosis. 4. Dilated main pulmonary artery, suggesting chronic pulmonary arterial hypertension. 5. Mild mosaic attenuation in both lungs, nonspecific, potentially due to mosaic perfusion from pulmonary vascular disease or air trapping from small airways disease. 6. Small amount of gas in the nondependent bladder lumen, correlate for history of recent bladder instrumentation. Suggestion of mild diffuse bladder wall thickening. Suggest correlation with urinalysis to exclude acute cystitis. 7. Mild colonic diverticulosis. 8. Multiple small coarsely calcified degenerated uterine fibroids. 9. Aortic Atherosclerosis (ICD10-I70.0). Electronically Signed   By: Ilona Sorrel M.D.   On: 10/13/2020 09:00   VAS [place] UPPER EXTREMITY ARTERIAL DUPLEX  Result Date: 09/18/2020 UPPER EXTREMITY DUPLEX STUDY Indications: Pre-operative exam.  Risk Factors: Hypertension, Diabetes. Comparison Study: No prior exam  Performing Technologist: Alvia Grove RVT  Examination  Guidelines: A complete evaluation includes B-mode imaging, spectral Doppler, color Doppler, and power Doppler as needed of all accessible portions of each vessel. Bilateral testing is considered an integral part of a complete examination. Limited examinations for reoccurring indications may be performed as noted.  Right Pre-Dialysis Findings: +-----------------------+----------+--------------------+----------+--------+ Location               PSV (cm/s)Intralum. Diam. (cm)Waveform  Comments +-----------------------+----------+--------------------+----------+--------+ Brachial Antecub. fossa114       0.44                triphasic          +-----------------------+----------+--------------------+----------+--------+ Radial Art at Wrist    119       0.35                monophasic         +-----------------------+----------+--------------------+----------+--------+ Ulnar Art at Wrist     105       0.22                monophasic         +-----------------------+----------+--------------------+----------+--------+  Left Pre-Dialysis Findings: +-----------------------+----------+--------------------+----------+--------+ Location               PSV (cm/s)Intralum. Diam. (cm)Waveform  Comments +-----------------------+----------+--------------------+----------+--------+ Brachial Antecub. fossa120       0.48                triphasic          +-----------------------+----------+--------------------+----------+--------+ Radial Art at Wrist    124       0.28                monophasic         +-----------------------+----------+--------------------+----------+--------+ Ulnar Art at Wrist     85        0.15                triphasic          +-----------------------+----------+--------------------+----------+--------+  Summary:   Measurements above. *See table(s) above for measurements and observations. Electronically signed by  Servando Snare MD on 09/18/2020 at 10:15:27 AM.    Final    ECHOCARDIOGRAM COMPLETE  Result Date: 10/09/2020    ECHOCARDIOGRAM REPORT   Patient Name:   Patricia Mata Date of Exam: 10/09/2020 Medical Rec #:  [id number]            Height:       64.0 in Accession #:    [id number]           Weight:       210.3 lb Date of Birth:  October 27, 1955             BSA:          1.999 m Patient Age:    65 years             BP:           132/51 mmHg Patient Gender: F                    HR:           72 bpm. Exam Location:  Inpatient Procedure: 2D Echo, Cardiac Doppler and Color Doppler Indications:    Pericardial effusion I31.3  History:        Patient has prior history of Echocardiogram examinations, most                 recent 06/07/2020. CHF, Pulmonary HTN, AS, Arrythmias:Atrial  Fibrillation; Risk Factors:Hypertension and Diabetes.  Sonographer:    Bernadene Person RDCS Referring Phys: [id number] [place]  1. Left ventricular ejection fraction, by estimation, is 50 to 55%. The left ventricle has low normal function. The left ventricle has no regional wall motion abnormalities. The left ventricular internal cavity size was mildly dilated. Left ventricular diastolic parameters are indeterminate.  2. Right ventricular systolic function is mildly reduced. The right ventricular size is normal. There is severely elevated pulmonary artery systolic pressure. The estimated right ventricular systolic pressure is 66.0 mmHg.  3. Left atrial size was mildly dilated.  4. Right atrial size was mildly dilated.  5. The mitral valve is degenerative. Trivial mitral valve regurgitation. Moderate mitral stenosis. The mean mitral valve gradient is 7.0 mmHg with MVA 1.5 cm^2 by VTI. Moderate to severe mitral annular calcification.  6. The aortic valve is tricuspid. Aortic valve regurgitation is mild. Severe aortic valve stenosis. Aortic valve area, by VTI measures 0.71 cm. Aortic valve mean gradient measures 65.0 mmHg.  7.  The inferior vena cava is dilated in size with >50% respiratory variability, suggesting right atrial pressure of 8 mmHg.  8. The patient was in atrial fibrillation. FINDINGS  Left Ventricle: Left ventricular ejection fraction, by estimation, is 50 to 55%. The left ventricle has low normal function. The left ventricle has no regional wall motion abnormalities. The left ventricular internal cavity size was mildly dilated. There is no left ventricular hypertrophy. Left ventricular diastolic parameters are indeterminate. Right Ventricle: The right ventricular size is normal. No increase in right ventricular wall thickness. Right ventricular systolic function is mildly reduced. There is severely elevated pulmonary artery systolic pressure. The tricuspid regurgitant velocity is 4.52 m/s, and with an assumed right atrial pressure of 8 mmHg, the estimated right ventricular systolic pressure is 60.0 mmHg. Left Atrium: Left atrial size was mildly dilated. Right Atrium: Right atrial size was mildly dilated. Pericardium: Trivial pericardial effusion is present. Mitral Valve: The mitral valve is degenerative in appearance. There is moderate calcification of the mitral valve leaflet(s). Moderate to severe mitral annular calcification. Trivial mitral valve regurgitation. Moderate mitral valve stenosis. MV peak gradient, 26.0 mmHg. The mean mitral valve gradient is 7.0 mmHg. Tricuspid Valve: The tricuspid valve is normal in structure. Tricuspid valve regurgitation is trivial. Aortic Valve: The aortic valve is tricuspid. Aortic valve regurgitation is mild. Aortic regurgitation PHT measures 247 msec. Severe aortic stenosis is present. Aortic valve mean gradient measures 65.0 mmHg. Aortic valve peak gradient measures 104.6 mmHg.  Aortic valve area, by VTI measures 0.71 cm. Pulmonic Valve: The pulmonic valve was normal in structure. Pulmonic valve regurgitation is trivial. Aorta: The aortic root is normal in size and structure.  Venous: The inferior vena cava is dilated in size with greater than 50% respiratory variability, suggesting right atrial pressure of 8 mmHg. IAS/Shunts: No atrial level shunt detected by color flow Doppler.  LEFT VENTRICLE PLAX 2D LVIDd:         5.80 cm  Diastology LVIDs:         3.90 cm  LV e' medial:  6.25 cm/s LV PW:         1.00 cm  LV e' lateral: 5.98 cm/s LV IVS:        0.80 cm LVOT diam:     2.00 cm LV SV:         86 LV SV Index:   43 LVOT Area:     3.14 cm  RIGHT VENTRICLE RV S  prime:     8.11 cm/s TAPSE (M-mode): 2.1 cm LEFT ATRIUM             Index       RIGHT ATRIUM           Index LA diam:        5.10 cm 2.55 cm/m  RA Area:     19.10 cm LA Vol (A2C):   71.4 ml 35.72 ml/m RA Volume:   50.20 ml  25.12 ml/m LA Vol (A4C):   74.1 ml 37.08 ml/m LA Biplane Vol: 74.3 ml 37.18 ml/m  AORTIC VALVE AV Area (Vmax):    0.71 cm AV Area (Vmean):   0.70 cm AV Area (VTI):     0.71 cm AV Vmax:           511.33 cm/s AV Vmean:          354.000 cm/s AV VTI:            1.217 m AV Peak Grad:      104.6 mmHg AV Mean Grad:      65.0 mmHg LVOT Vmax:         115.50 cm/s LVOT Vmean:        78.900 cm/s LVOT VTI:          0.274 m LVOT/AV VTI ratio: 0.23 AI PHT:            247 msec  AORTA Ao Root diam: 3.20 cm Ao Asc diam:  3.00 cm MITRAL VALVE              TRICUSPID VALVE MV Area (PHT): 2.97 cm   TR Peak grad:   81.7 mmHg MV Area VTI:   1.32 cm   TR Vmax:        452.00 cm/s MV Peak grad:  26.0 mmHg MV Mean grad:  7.0 mmHg   SHUNTS MV Vmax:       2.55 m/s   Systemic VTI:  0.27 m MV Vmean:      113.0 cm/s Systemic Diam: 2.00 cm MR Peak grad: 24.4 mmHg MR Vmax:      247.00 cm/s Loralie Champagne MD Electronically signed by Loralie Champagne MD Signature Date/Time: 10/09/2020/4:36:06 PM    Final    VAS [place] UPPER EXTREMITY VEIN MAPPING  Result Date: 09/18/2020 UPPER EXTREMITY VEIN MAPPING  Indications: Pre-access. History: HTN, DM.  Performing Technologist: Alvia Grove RVT  Examination Guidelines: A complete evaluation includes B-mode  imaging, spectral Doppler, color Doppler, and power Doppler as needed of all accessible portions of each vessel. Bilateral testing is considered an integral part of a complete examination. Limited examinations for reoccurring indications may be performed as noted. +-----------------+-------------+----------+---------+ Right Cephalic   Diameter (cm)Depth (cm)Findings  +-----------------+-------------+----------+---------+ Shoulder             0.35                         +-----------------+-------------+----------+---------+ Prox upper arm       0.43                         +-----------------+-------------+----------+---------+ Mid upper arm     0.56 / 0.49                     +-----------------+-------------+----------+---------+ Dist upper arm       0.45  branching +-----------------+-------------+----------+---------+ Antecubital fossa    0.64                         +-----------------+-------------+----------+---------+ Prox forearm      0.41 / 0.60             joins   +-----------------+-------------+----------+---------+ Mid forearm          0.35               branching +-----------------+-------------+----------+---------+ Dist forearm         0.29                         +-----------------+-------------+----------+---------+ +-----------------+-------------+----------+-----------+ Right Basilic    Diameter (cm)Depth (cm) Findings   +-----------------+-------------+----------+-----------+ Prox upper arm       0.43               prox to mid +-----------------+-------------+----------+-----------+ Mid upper arm        0.38                           +-----------------+-------------+----------+-----------+ Dist upper arm       0.31                           +-----------------+-------------+----------+-----------+ Antecubital fossa    0.39                           +-----------------+-------------+----------+-----------+ Prox  forearm         0.38                           +-----------------+-------------+----------+-----------+ +-----------------+-------------+----------+--------+ Left Cephalic    Diameter (cm)Depth (cm)Findings +-----------------+-------------+----------+--------+ Shoulder             0.21                        +-----------------+-------------+----------+--------+ Prox upper arm       0.27                        +-----------------+-------------+----------+--------+ Mid upper arm     0.31 / 0.35                    +-----------------+-------------+----------+--------+ Dist upper arm    0.45 / 0.27            valves  +-----------------+-------------+----------+--------+ Antecubital fossa 0.36 / 0.49                    +-----------------+-------------+----------+--------+ Prox forearm         0.32                        +-----------------+-------------+----------+--------+ Mid forearm          0.27                        +-----------------+-------------+----------+--------+ Dist forearm         0.20                        +-----------------+-------------+----------+--------+ +-----------------+-------------+----------+---------+ Left Basilic     Diameter (cm)Depth (cm)Findings  +-----------------+-------------+----------+---------+ Prox upper arm    0.36 / 0.49  branching +-----------------+-------------+----------+---------+ Mid upper arm     0.35 / 0.35                     +-----------------+-------------+----------+---------+ Dist upper arm       0.32                         +-----------------+-------------+----------+---------+ Antecubital fossa    0.23               branching +-----------------+-------------+----------+---------+ Prox forearm      0.21 / 0.19                     +-----------------+-------------+----------+---------+ Summary:   Measurements above. *See table(s) above for measurements and observations.  Diagnosing  physician: Servando Snare MD Electronically signed by Servando Snare MD on 09/18/2020 at 9:27:58 AM.    Final    CT Angio Abd/Pel w/ and/or w/o  Result Date: 10/13/2020 CLINICAL DATA:  Inpatient with severe aortic stenosis. Pre-TAVR planning. EXAM: CT ANGIOGRAPHY CHEST, ABDOMEN AND PELVIS TECHNIQUE: Multidetector CT imaging through the chest, abdomen and pelvis was performed using the standard protocol during bolus administration of intravenous contrast. Multiplanar reconstructed images and MIPs were obtained and reviewed to evaluate the vascular anatomy. CONTRAST:  162m OMNIPAQUE IOHEXOL 350 MG/ML SOLN COMPARISON:  03/02/2020 chest CT. FINDINGS: CTA CHEST FINDINGS Cardiovascular: Mild-to-moderate cardiomegaly. Diffusely calcified and thickened aortic valve. No significant pericardial effusion/thickening. Right internal jugular central venous catheter terminates in the right atrium. Three-vessel coronary atherosclerosis. Atherosclerotic nonaneurysmal thoracic aorta. Dilated main pulmonary artery (3.7 cm diameter). No central pulmonary emboli. Mediastinum/Nodes: Subcentimeter hypodense bilateral thyroid nodules. Not clinically significant; no follow-up imaging recommended (ref: J Am Coll Radiol. 2015 Feb;12(2): 143-50). Unremarkable esophagus. No axillary adenopathy. No pathologically enlarged mediastinal nodes. Borderline mildly prominent bilateral hilar nodes, largest 1.0 cm on the left (series 4/image 65). Lungs/Pleura: No pneumothorax. No pleural effusion. No acute consolidative airspace disease, lung masses or significant pulmonary nodules. Mild mosaic attenuation in both lungs. Musculoskeletal: No aggressive appearing focal osseous lesions. Mild thoracic spondylosis. CTA ABDOMEN AND PELVIS FINDINGS Hepatobiliary: Normal liver with no liver mass. Normal gallbladder with no radiopaque cholelithiasis. No biliary ductal dilatation. Pancreas: Normal, with no mass or duct dilation. Spleen: Normal size. No mass.  Adrenals/Urinary Tract: Normal adrenals. No contour deforming renal masses. No hydronephrosis. Bladder is nondistended. Suggestion of mild diffuse bladder wall thickening. Small amount of gas in the nondependent bladder lumen. Stomach/Bowel: Normal non-distended stomach. Subtotal right hemicolectomy with ileocolic anastomosis in the right abdomen. No small bowel dilatation or wall thickening. Scattered mild colonic diverticulosis with no large bowel wall thickening or acute pericolonic fat stranding. Vascular/Lymphatic: Severely atherosclerotic nonaneurysmal abdominal aorta. No pathologically enlarged lymph nodes in the abdomen or pelvis. Reproductive: Multiple small coarsely calcified degenerated uterine fibroids, largest 1.7 cm in the upper right uterine body. No adnexal masses. Other: No pneumoperitoneum, ascites or focal fluid collection. Musculoskeletal: No aggressive appearing focal osseous lesions. Moderate lumbar spondylosis. VASCULAR MEASUREMENTS PERTINENT TO TAVR: AORTA: Minimal Aortic Diameter-0 mm Severity of Aortic Calcification-severe There is occlusive or near close calcified atherosclerotic plaque in the infrarenal abdominal aorta involving approximately 2 cm in length segment of the aorta. RIGHT PELVIS: Right Common Iliac Artery - Minimal Diameter-6.9 x 5.8 mm Tortuosity-mild Calcification-severe Right External Iliac Artery - Minimal Diameter-7.2 x 6.7 mm Tortuosity-mild Calcification-mild Right Common Femoral Artery - Minimal Diameter-7.8 x 6.8 mm Tortuosity-mild Calcification-moderate LEFT PELVIS: Left Common Iliac Artery - Minimal Diameter-10.2  x 5.9 mm Tortuosity-mild Calcification-severe Left External Iliac Artery - Minimal Diameter-7.3 x 6.3 mm Tortuosity-mild Calcification-mild-to-moderate Left Common Femoral Artery - Minimal Diameter-7.0 x 3.6 mm Tortuosity-mild Calcification-moderate to severe Review of the MIP images confirms the above findings. IMPRESSION: 1. Vascular findings and  measurements pertinent to potential TAVR procedure, as detailed. Note is made of focal occlusive or near occlusive calcified atherosclerotic plaque involving a 2 cm length segment of the infrarenal abdominal aorta. 2. Mild-to-moderate cardiomegaly. Three-vessel coronary atherosclerosis. 3. Diffusely thickened and calcified aortic valve, compatible with reported history of severe aortic stenosis. 4. Dilated main pulmonary artery, suggesting chronic pulmonary arterial hypertension. 5. Mild mosaic attenuation in both lungs, nonspecific, potentially due to mosaic perfusion from pulmonary vascular disease or air trapping from small airways disease. 6. Small amount of gas in the nondependent bladder lumen, correlate for history of recent bladder instrumentation. Suggestion of mild diffuse bladder wall thickening. Suggest correlation with urinalysis to exclude acute cystitis. 7. Mild colonic diverticulosis. 8. Multiple small coarsely calcified degenerated uterine fibroids. 9. Aortic Atherosclerosis (ICD10-I70.0). Electronically Signed   By: Ilona Sorrel M.D.   On: 10/13/2020 09:00   (Echo, Carotid, EGD, Colonoscopy, ERCP)    Subjective: Patient seen and examined.  Up about and ready to go home.   Discharge Exam: Vitals:   10/16/20 0422 10/16/20 1016  BP: (!) 128/42 (!) 128/47  Pulse:    Resp: 16   Temp: 98.8 F (37.1 C)   SpO2:     Vitals:   10/15/20 1512 10/15/20 2053 10/16/20 0422 10/16/20 1016  BP: 97/69 (!) 152/47 (!) 128/42 (!) 128/47  Pulse: (!) 52     Resp:  18 16   Temp:  98.5 F (36.9 C) 98.8 F (37.1 C)   TempSrc:  Oral Oral   SpO2:      Weight:   94 kg   Height:        General: Pt is alert, awake, not in acute distress Left nostril packing removed.  Clean and dry. Cardiovascular: RRR, S1/S2 +, no rubs, no gallops Right chest wall permacath present. Respiratory: CTA bilaterally, no wheezing, no rhonchi Abdominal: Soft, NT, ND, bowel sounds + Extremities: no edema, no  cyanosis, AV fistula on the right arm present.    The results of significant diagnostics from this hospitalization (including imaging, microbiology, ancillary and laboratory) are listed below for reference.     Microbiology: Recent Results (from the past 240 hour(s))  Resp Panel by RT-PCR (Flu A&B, Covid) Nasopharyngeal Swab     Status: None   Collection Time: 10/09/20  1:37 PM   Specimen: Nasopharyngeal Swab; Nasopharyngeal(NP) swabs in vial transport medium  Result Value Ref Range Status   SARS Coronavirus 2 by RT PCR NEGATIVE NEGATIVE Final    Comment: (NOTE) SARS-CoV-2 target nucleic acids are NOT DETECTED.  The SARS-CoV-2 RNA is generally detectable in upper respiratory specimens during the acute phase of infection. The lowest concentration of SARS-CoV-2 viral copies this assay can detect is 138 copies/mL. A negative result does not preclude SARS-Cov-2 infection and should not be used as the sole basis for treatment or other patient management decisions. A negative result may occur with  improper specimen collection/handling, submission of specimen other than nasopharyngeal swab, presence of viral mutation(s) within the areas targeted by this assay, and inadequate number of viral copies(<138 copies/mL). A negative result must be combined with clinical observations, patient history, and epidemiological information. The expected result is Negative.  Fact Sheet for Patients:  EntrepreneurPulse.com.au  Fact Sheet for Healthcare Providers:  IncredibleEmployment.be  This test is no t yet approved or cleared by the [place] FDA and  has been authorized for detection and/or diagnosis of SARS-CoV-2 by FDA under an Emergency Use Authorization (EUA). This EUA will remain  in effect (meaning this test can be used) for the duration of the COVID-19 declaration under Section 564(b)(1) of the Act, 21 U.S.C.section 360bbb-3(b)(1), unless the  authorization is terminated  or revoked sooner.       Influenza A by PCR NEGATIVE NEGATIVE Final   Influenza B by PCR NEGATIVE NEGATIVE Final    Comment: (NOTE) The Xpert Xpress SARS-CoV-2/FLU/RSV plus assay is intended as an aid in the diagnosis of influenza from Nasopharyngeal swab specimens and should not be used as a sole basis for treatment. Nasal washings and aspirates are unacceptable for Xpert Xpress SARS-CoV-2/FLU/RSV testing.  Fact Sheet for Patients: EntrepreneurPulse.com.au  Fact Sheet for Healthcare Providers: IncredibleEmployment.be  This test is not yet approved or cleared by the [place] FDA and has been authorized for detection and/or diagnosis of SARS-CoV-2 by FDA under an Emergency Use Authorization (EUA). This EUA will remain in effect (meaning this test can be used) for the duration of the COVID-19 declaration under Section 564(b)(1) of the Act, 21 U.S.C. section 360bbb-3(b)(1), unless the authorization is terminated or revoked.  Performed at [facility], [place] [address], [place], [place] [address]   MRSA PCR Screening     Status: None   Collection Time: 10/11/20  9:38 PM   Specimen: Nasal Mucosa; Nasopharyngeal  Result Value Ref Range Status   MRSA by PCR NEGATIVE NEGATIVE Final    Comment:        The GeneXpert MRSA Assay (FDA approved for NASAL specimens only), is one component of a comprehensive MRSA colonization surveillance program. It is not intended to diagnose MRSA infection nor to guide or monitor treatment for MRSA infections. Performed at [facility], [place] [address], [place], [place] [address]      Labs: BNP (last 3 results) Recent Labs    06/07/20 0826 07/06/20 1733  BNP 489.2* 536.4*   Basic Metabolic Panel: Recent Labs  Lab 10/10/20 0939 10/11/20 0357 10/12/20 0212 10/13/20 0807 10/13/20 0817  NA 134* 135 136 140 137  137  K 5.2* 4.2 3.8 3.5 3.8  3.8  CL 99 100  102  --   --   CO2 '24 24 26  ' --   --   GLUCOSE 160* 161* 143*  --   --   BUN 153* 163* 88*  --   --   CREATININE 4.78* 5.15* 3.83*  --   --   CALCIUM 9.5 9.5 8.9  --   --    Liver Function Tests: No results for input(s): AST, ALT, ALKPHOS, BILITOT, PROT, ALBUMIN in the last 168 hours. No results for input(s): LIPASE, AMYLASE in the last 168 hours. No results for input(s): AMMONIA in the last 168 hours. CBC: Recent Labs  Lab 10/09/20 1540 10/10/20 0939 10/11/20 0357 10/12/20 0212 10/13/20 0242 10/13/20 0807 10/13/20 0817 10/14/20 0207 10/15/20 0242 10/15/20 1256  WBC 6.7 8.0 8.8 7.4 6.0  --   --  6.3 7.1  --   NEUTROABS 5.5 6.6  --   --   --   --   --   --   --   --   HGB 4.6* 8.3* 7.3* 7.2* 7.3* 7.1* 7.8*  7.8* 7.4* 7.7* 8.3*  HCT 14.2*  23.8* 20.9* 21.0* 20.9* 21.0* 23.0*  23.0* 22.2* 22.6* 25.2*  MCV 97.9 90.2 89.7 92.5 91.3  --   --  94.1 94.2  --   PLT 196 178 191 175 165  --   --  198 187  --    Cardiac Enzymes: No results for input(s): CKTOTAL, CKMB, CKMBINDEX, TROPONINI in the last 168 hours. BNP: Invalid input(s): POCBNP CBG: Recent Labs  Lab 10/15/20 0728 10/15/20 1148 10/15/20 1642 10/15/20 2057 10/16/20 0754  GLUCAP 102* 231* 110* 248* 71   D-Dimer No results for input(s): DDIMER in the last 72 hours. Hgb A1c No results for input(s): HGBA1C in the last 72 hours. Lipid Profile No results for input(s): CHOL, HDL, LDLCALC, TRIG, CHOLHDL, LDLDIRECT in the last 72 hours. Thyroid function studies No results for input(s): TSH, T4TOTAL, T3FREE, THYROIDAB in the last 72 hours.  Invalid input(s): FREET3 Anemia work up No results for input(s): VITAMINB12, FOLATE, FERRITIN, TIBC, IRON, RETICCTPCT in the last 72 hours. Urinalysis    Component Value Date/Time   COLORURINE AMBER (A) 10/09/2020 2320   APPEARANCEUR CLOUDY (A) 10/09/2020 2320   LABSPEC 1.016 10/09/2020 2320   PHURINE 5.0 10/09/2020 2320   GLUCOSEU NEGATIVE 10/09/2020 2320   HGBUR SMALL (A)  10/09/2020 2320   BILIRUBINUR NEGATIVE 10/09/2020 2320   BILIRUBINUR negative 05/11/2020 1152   KETONESUR NEGATIVE 10/09/2020 2320   PROTEINUR 100 (A) 10/09/2020 2320   UROBILINOGEN 0.2 05/11/2020 1152   NITRITE NEGATIVE 10/09/2020 2320   LEUKOCYTESUR MODERATE (A) 10/09/2020 2320   Sepsis Labs Invalid input(s): PROCALCITONIN,  WBC,  LACTICIDVEN Microbiology Recent Results (from the past 240 hour(s))  Resp Panel by RT-PCR (Flu A&B, Covid) Nasopharyngeal Swab     Status: None   Collection Time: 10/09/20  1:37 PM   Specimen: Nasopharyngeal Swab; Nasopharyngeal(NP) swabs in vial transport medium  Result Value Ref Range Status   SARS Coronavirus 2 by RT PCR NEGATIVE NEGATIVE Final    Comment: (NOTE) SARS-CoV-2 target nucleic acids are NOT DETECTED.  The SARS-CoV-2 RNA is generally detectable in upper respiratory specimens during the acute phase of infection. The lowest concentration of SARS-CoV-2 viral copies this assay can detect is 138 copies/mL. A negative result does not preclude SARS-Cov-2 infection and should not be used as the sole basis for treatment or other patient management decisions. A negative result may occur with  improper specimen collection/handling, submission of specimen other than nasopharyngeal swab, presence of viral mutation(s) within the areas targeted by this assay, and inadequate number of viral copies(<138 copies/mL). A negative result must be combined with clinical observations, patient history, and epidemiological information. The expected result is Negative.  Fact Sheet for Patients:  EntrepreneurPulse.com.au  Fact Sheet for Healthcare Providers:  IncredibleEmployment.be  This test is no t yet approved or cleared by the [place] FDA and  has been authorized for detection and/or diagnosis of SARS-CoV-2 by FDA under an Emergency Use Authorization (EUA). This EUA will remain  in effect (meaning this test can be  used) for the duration of the COVID-19 declaration under Section 564(b)(1) of the Act, 21 U.S.C.section 360bbb-3(b)(1), unless the authorization is terminated  or revoked sooner.       Influenza A by PCR NEGATIVE NEGATIVE Final   Influenza B by PCR NEGATIVE NEGATIVE Final    Comment: (NOTE) The Xpert Xpress SARS-CoV-2/FLU/RSV plus assay is intended as an aid in the diagnosis of influenza from Nasopharyngeal swab specimens and should not be used as a sole basis for treatment. Nasal  washings and aspirates are unacceptable for Xpert Xpress SARS-CoV-2/FLU/RSV testing.  Fact Sheet for Patients: EntrepreneurPulse.com.au  Fact Sheet for Healthcare Providers: IncredibleEmployment.be  This test is not yet approved or cleared by the [place] FDA and has been authorized for detection and/or diagnosis of SARS-CoV-2 by FDA under an Emergency Use Authorization (EUA). This EUA will remain in effect (meaning this test can be used) for the duration of the COVID-19 declaration under Section 564(b)(1) of the Act, 21 U.S.C. section 360bbb-3(b)(1), unless the authorization is terminated or revoked.  Performed at [facility], [place] [address], [place], [place] [address]   MRSA PCR Screening     Status: None   Collection Time: 10/11/20  9:38 PM   Specimen: Nasal Mucosa; Nasopharyngeal  Result Value Ref Range Status   MRSA by PCR NEGATIVE NEGATIVE Final    Comment:        The GeneXpert MRSA Assay (FDA approved for NASAL specimens only), is one component of a comprehensive MRSA colonization surveillance program. It is not intended to diagnose MRSA infection nor to guide or monitor treatment for MRSA infections. Performed at [facility], [place] [address], [place], [place] [address]      Time coordinating discharge:  32 minutes  SIGNED:   Barb Merino, MD  [facility] 10/16/2020, 11:38 AM

## 2020-10-16 NOTE — Progress Notes (Signed)
[facility] Progress Note   Assessment/Plan  Patricia Mata is an 65 y.o. female with CKD 5, HTN, AS (severe), mitral stenosis (mod/severe), gout, pulmonary HTN, DM, MDS, gout who presented with chest pain, found to have severe anemia in the 4s and is seen for AKI on CKD.   **Chest pain, severe AS: thought to be demand ischemia in setting of severe anemia and AS and now resolved.  Cardiology is following and she underwent L/RHC, findings noted - outpt discussion re valve treatment.  CP has resolved and not recurred with HD.  Avoid anemia.   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  Initiated HD 3/30 for uremia. Tol tx well.  Last HD 4/2.  CLIP complete and is to start 4/5 as OP.  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  Transfuse pRBC PRN Hb < 7.  I think part of the issue lately has been epistaxis which may be exacerbated by uremic platelet dysfunction. ENT has packed nose - to remain 5d with outpt f/u.   **HTN:  Cont home medications. Well controlled currently.   **Pulmonary HTN: on sildenafil.   **A fibrillation, paroxysmal:  On amiodarone.    OK to discharge.  Call with questions  Subjective/Interval Events:   No overnight events.  Eager for d/c today.  Son at bedside.  Objective Vitals:   10/15/20 1512 10/15/20 2053 10/16/20 0422 10/16/20 1016  BP: 97/69 (!) 152/47 (!) 128/42 (!) 128/47  Pulse: (!) 52     Resp:  18 16   Temp:  98.5 F (36.9 C) 98.8 F (37.1 C)   TempSrc:  Oral Oral   SpO2:      Weight:   94 kg   Height:       Physical Exam Gen: awake and alert, looks comfortable in bed Eyes: anicteric ENT: MMM,  Neck: supple Abd:  soft Lungs: normal WOB  Extr:  No edema, RUE +t/b, not maturee Neuro: no asterixis, conversant Skin: no rashes  Additional Objective Labs: Basic Metabolic Panel: Recent Labs  Lab  10/10/20 0939 10/11/20 0357 10/12/20 0212 10/13/20 0807 10/13/20 0817  NA 134* 135 136 140 137  137  K 5.2* 4.2 3.8 3.5 3.8  3.8  CL 99 100 102  --   --   CO2 24 24 26   --   --   GLUCOSE 160* 161* 143*  --   --   BUN 153* 163* 88*  --   --   CREATININE 4.78* 5.15* 3.83*  --   --   CALCIUM 9.5 9.5 8.9  --   --    Liver Function Tests: No results for input(s): AST, ALT, ALKPHOS, BILITOT, PROT, ALBUMIN in the last 168 hours. No results for input(s): LIPASE, AMYLASE in the last 168 hours. CBC: Recent Labs  Lab 10/09/20 1540 10/10/20 0939 10/11/20 0357 10/12/20 0212 10/13/20 0242 10/13/20 0807 10/14/20 0207 10/15/20 0242 10/15/20 1256  WBC 6.7 8.0 8.8 7.4 6.0  --  6.3 7.1  --   NEUTROABS 5.5 6.6  --   --   --   --   --   --   --   HGB 4.6* 8.3* 7.3* 7.2* 7.3*   < > 7.4* 7.7* 8.3*  HCT 14.2* 23.8* 20.9* 21.0* 20.9*   < > 22.2* 22.6* 25.2*  MCV 97.9 90.2 89.7 92.5  91.3  --  94.1 94.2  --   PLT 196 178 191 175 165  --  198 187  --    < > = values in this interval not displayed.   Blood Culture    Component Value Date/Time   SDES  09/24/2019 1144    TRACHEAL ASPIRATE Performed at [facility], [place] [address] [place], [place] [address]    SPECREQUEST  09/24/2019 1144    NONE Performed at [facility], [place] [address], [place], [place] [address]    CULT FEW CANDIDA ALBICANS 09/24/2019 1144   REPTSTATUS 09/27/2019 FINAL 09/24/2019 1144    Cardiac Enzymes: No results for input(s): CKTOTAL, CKMB, CKMBINDEX, TROPONINI in the last 168 hours. CBG: Recent Labs  Lab 10/15/20 0728 10/15/20 1148 10/15/20 1642 10/15/20 2057 10/16/20 0754  GLUCAP 102* 231* 110* 248* 71   Iron Studies: No results for input(s): IRON, TIBC, TRANSFERRIN, FERRITIN in the last 72 hours. @lablastinr3 @ Studies/Results: No results found. Medications:  . allopurinol  100 mg Oral Daily  . amiodarone  200 mg Oral Daily  . atorvastatin  40 mg Oral Daily  .  Chlorhexidine Gluconate Cloth  6 each Topical Q0600  . cloNIDine  0.1 mg Oral BID  . fluticasone  1 spray Each Nare Daily  . furosemide  40 mg Oral Daily  . hydrALAZINE  100 mg Oral TID  . insulin aspart  0-9 Units Subcutaneous TID WC  . insulin glargine  20 Units Subcutaneous Daily  . latanoprost  1 drop Both Eyes QHS  . levothyroxine  150 mcg Oral QAC breakfast  . linagliptin  5 mg Oral Daily  . loratadine  10 mg Oral QODAY  . pantoprazole  40 mg Oral Daily  . polyethylene glycol  17 g Oral Daily  . senna-docusate  1 tablet Oral BID  . sildenafil  20 mg Oral TID  . sodium chloride flush  3 mL Intravenous Q12H        Madelon Lips MD 10/16/2020, 11:24 AM  [facility] [phone]

## 2020-10-16 NOTE — Care Management Important Message (Signed)
Important Message  Patient Details  Name: Patricia Mata MRN: [id number] Date of Birth: Apr 30, 1956   Medicare Important Message Given:  Yes     Shelda Altes 10/16/2020, 10:38 AM

## 2020-10-16 NOTE — Discharge Instructions (Signed)
Chronic Kidney Disease, Adult Chronic kidney disease is when lasting damage happens to the kidneys slowly over a long time. The kidneys help to:  Make pee (urine).  Make hormones.  Keep the right amount of fluids and chemicals in the body. Most often, this disease does not go away. You must take steps to help keep the kidney damage from getting worse. If steps are not taken, the kidneys might stop working forever. What are the causes?  Diabetes.  High blood pressure.  Diseases that affect the heart and blood vessels.  Other kidney diseases.  Diseases of the body's disease-fighting system.  A problem with the flow of pee.  Infections of the organs that make pee, store it, and take it out of the body.  Swelling or irritation of your blood vessels. What increases the risk?  Getting older.  Having someone in your family who has kidney disease or kidney failure.  Having a disease caused by genes.  Taking medicines often that harm the kidneys.  Being near or having contact with harmful substances.  Being very overweight.  Using tobacco now or in the past. What are the signs or symptoms?  Feeling very tired.  Having a swollen face, legs, ankles, or feet.  Feeling like you may vomit or vomiting.  Not feeling hungry.  Being confused or not able to focus.  Twitches and cramps in the leg muscles or other muscles.  Dry, itchy skin.  A taste of metal in your mouth.  Making less pee, or making more pee.  Shortness of breath.  Trouble sleeping. You may also become anemic or get weak bones. Anemic means there is not enough red blood cells or hemoglobin in your blood. You may get symptoms slowly. You may not notice them until the kidney damage gets very bad. How is this treated? Often, there is no cure for this disease. Treatment can help with symptoms and help keep the disease from getting worse. You may need to:  Avoid alcohol.  Avoid foods that are high in  salt, potassium, phosphorous, and protein.  Take medicines for symptoms and to help control other conditions.  Have dialysis. This treatment gets harmful waste out of your body.  Treat other problems that cause your kidney disease or make it worse. Follow these instructions at home: Medicines  Take over-the-counter and prescription medicines only as told by your doctor.  Do not take any new medicines, vitamins, or supplements unless your doctor says it is okay. Lifestyle  Do not smoke or use any products that contain nicotine or tobacco. If you need help quitting, ask your doctor.  If you drink alcohol: ? Limit how much you use to:  0-1 drink a day for women who are not pregnant.  0-2 drinks a day for men. ? Know how much alcohol is in your drink. In the [place].S., one drink equals one 12 oz bottle of beer (355 mL), one 5 oz glass of wine (148 mL), or one 1 oz glass of hard liquor (44 mL).  Stay at a healthy weight. If you need help losing weight, ask your doctor.   General instructions  Follow instructions from your doctor about what you cannot eat or drink.  Track your blood pressure at home. Tell your doctor about any changes.  If you have diabetes, track your blood sugar.  Exercise at least 30 minutes a day, 5 days a week.  Keep your shots (vaccinations) up to date.  Keep all follow-up visits.     Where to find more information  American Association of Kidney Patients: BombTimer.gl  National Kidney Foundation: www.kidney.[place]: https://mathis.com/  Life Options: www.lifeoptions.org  Kidney School: www.kidneyschool.org Contact a doctor if:  Your symptoms get worse.  You get new symptoms. Get help right away if:  You get symptoms of end-stage kidney disease. These include: ? Headaches. ? Losing feeling in your hands or feet. ? Easy bruising. ? Having hiccups often. ? Chest pain. ? Shortness of breath. ? Lack of menstrual periods, in  women.  You have a fever.  You make less pee than normal.  You have pain or you bleed when you pee or poop. These symptoms may be an emergency. Get help right away. Call your local emergency services (911 in the U.S[place]  Do not wait to see if the symptoms will go away.  Do not drive yourself to the hospital. Summary  Chronic kidney disease is when lasting damage happens to the kidneys slowly over a long time.  Causes of this disease include diabetes and high blood pressure.  Often, there is no cure for this disease. Treatment can help symptoms and help keep the disease from getting worse.  Treatment may involve lifestyle changes, medicines, and dialysis. This information is not intended to replace advice given to you by your health care provider. Make sure you discuss any questions you have with your health care provider. Document Revised: 10/06/2019 Document Reviewed: 10/06/2019 Elsevier Patient Education  2021 [place].   Chest Wall Pain Chest wall pain is pain in or around the bones and muscles of your chest. Chest wall pain may be caused by:  An injury.  Coughing a lot.  Using your chest and arm muscles too much. Sometimes, the cause may not be known. This pain may take a few weeks or longer to get better. Follow these instructions at home: Managing pain, stiffness, and swelling If told, put ice on the painful area:  Put ice in a plastic bag.  Place a towel between your skin and the bag.  Leave the ice on for 20 minutes, 2-3 times a day.   Activity  Rest as told by your doctor.  Avoid doing things that cause pain. This includes lifting heavy items.  Ask your doctor what activities are safe for you. General instructions  Take over-the-counter and prescription medicines only as told by your doctor.  Do not use any products that contain nicotine or tobacco, such as cigarettes, e-cigarettes, and chewing tobacco. If you need help quitting, ask your  doctor.  Keep all follow-up visits as told by your doctor. This is important.   Contact a doctor if:  You have a fever.  Your chest pain gets worse.  You have new symptoms. Get help right away if:  You feel sick to your stomach (nauseous) or you throw up (vomit).  You feel sweaty or light-headed.  You have a cough with mucus from your lungs (sputum) or you cough up blood.  You are short of breath. These symptoms may be an emergency. Do not wait to see if the symptoms will go away. Get medical help right away. Call your local emergency services (911 in the [place].S[place] Do not drive yourself to the hospital. Summary  Chest wall pain is pain in or around the bones and muscles of your chest.  It may be treated with ice, rest, and medicines. Your condition may also get better if you avoid doing things that cause pain.  Contact a  doctor if you have a fever, chest pain that gets worse, or new symptoms.  Get help right away if you feel light-headed or you get short of breath. These symptoms may be an emergency. This information is not intended to replace advice given to you by your health care provider. Make sure you discuss any questions you have with your health care provider. Document Revised: 01/01/2018 Document Reviewed: 01/01/2018 Elsevier Patient Education  2021 [employer].

## 2020-10-16 NOTE — Progress Notes (Signed)
Patient has been accepted to [facility] on a TTS schedule with a seat time of 12:40pm. She needs to arrive to her appointments at 12:20pm. On her first day at the clinic, patient needs to arrive at 11:40am to complete intake paperwork prior to her first treatment.  Patient is cleared for discharge today per Attending and Nephrologist. Renal Navigator notified HD RN Coordinator to request education be given to patient prior to discharge and met with patient and son at bedside to provide information regarding clinic and schedule verbally and in writing. They are understanding and appreciative. Clinic and [facility] Admissions updated that patient will start tomorrow. HD orders have been sent.   Alphonzo Cruise, [place] Renal Navigator [phone]

## 2020-10-16 NOTE — Progress Notes (Signed)
Rounded on patient today in correlation to transition to outpatient HD. Patient was found pleasant and in the bed. Son at the bedside as well. Given permission to speak in front of family. Ordered Kidney Failure book. Patient educated at the bedside regarding care of tunneled dialysis catheter, AV fistula/graft site care, assessment of thrill daily and proper medication administration on HD days.    Patient also educated on the importance of adhering to scheduled dialysis treatments, the effects of fluid overload, hyperkalemia and hyperphosphatemia. Patient verbalizes that she is familiar with this diet and fluid restriction but that it has been difficult to cut out salt completely. Also discussed with patient the adverse affects of ice. Patient capable of re-verbalizing via teach back method. Also educated patient on services available through the interdisciplinary team in the clinic setting. Patient's son appears to be a strong advocate in mother's healthcare process.  Patient with no further questions at this time. Handouts and contact information provided to patient for any further assistance. Plan for patient to discharge home today.  Dorthey Sawyer, RN  Dialysis Nurse Coordinator Phone: [phone]

## 2020-10-16 NOTE — Progress Notes (Addendum)
Advanced Heart Failure Rounding Note  PCP: Darleen Crocker Primary Cardiologist:   Subjective:    Tolerating HD, no further epistaxis. Nose remains packed.  Denies dyspnea or chest pain would like to leave.    Objective:   Weight Range: 94 kg Body mass index is 35.57 kg/m.   Vital Signs:   Temp:  [97.5 F (36.4 C)-98.8 F (37.1 C)] 98.8 F (37.1 C) (04/04 0422) Pulse Rate:  [51-52] 52 (04/03 1512) Resp:  [16-18] 16 (04/04 0422) BP: (97-152)/(42-69) 128/42 (04/04 0422) Weight:  [94 kg] 94 kg (04/04 0422) Last BM Date: 10/15/20  Weight change: Filed Weights   10/12/20 1245 10/15/20 0307 10/16/20 0422  Weight: 91 kg 93.5 kg 94 kg    Intake/Output:  No intake or output data in the 24 hours ending 10/16/20 0747    Physical Exam  Cardiac: JVD 9, bradycardic with normal rhythm, 3/6 AS with diminished S2, no rubs or gallops, no LE edema Pulmonary: fine rales bibasilar, not in distress Abdominal: non distended abdomen, soft and nontender Psych: Alert, conversant, in good spirits Nose: right nare with nasal packing, no bleeding   Telemetry   NSR rates  40's-50's  Labs    CBC Recent Labs    10/14/20 0207 10/15/20 0242 10/15/20 1256  WBC 6.3 7.1  --   HGB 7.4* 7.7* 8.3*  HCT 22.2* 22.6* 25.2*  MCV 94.1 94.2  --   PLT 198 187  --    Basic Metabolic Panel Recent Labs    10/13/20 0807 10/13/20 0817  NA 140 137  137  K 3.5 3.8  3.8   Liver Function Tests No results for input(s): AST, ALT, ALKPHOS, BILITOT, PROT, ALBUMIN in the last 72 hours. No results for input(s): LIPASE, AMYLASE in the last 72 hours. Cardiac Enzymes No results for input(s): CKTOTAL, CKMB, CKMBINDEX, TROPONINI in the last 72 hours.  BNP: BNP (last 3 results) Recent Labs    06/07/20 0826 07/06/20 1733  BNP 489.2* 932.6*    ProBNP (last 3 results) No results for input(s): PROBNP in the last 8760 hours.   D-Dimer No results for input(s): DDIMER in the last 72  hours. Hemoglobin A1C No results for input(s): HGBA1C in the last 72 hours. Fasting Lipid Panel No results for input(s): CHOL, HDL, LDLCALC, TRIG, CHOLHDL, LDLDIRECT in the last 72 hours. Thyroid Function Tests No results for input(s): TSH, T4TOTAL, T3FREE, THYROIDAB in the last 72 hours.  Invalid input(s): FREET3  Other results:   Imaging    No results found.   Medications:     Scheduled Medications: . allopurinol  100 mg Oral Daily  . amiodarone  200 mg Oral Daily  . atorvastatin  40 mg Oral Daily  . carvedilol  6.25 mg Oral BID  . Chlorhexidine Gluconate Cloth  6 each Topical Q0600  . cloNIDine  0.1 mg Oral BID  . fluticasone  1 spray Each Nare Daily  . furosemide  40 mg Oral Daily  . hydrALAZINE  100 mg Oral TID  . insulin aspart  0-9 Units Subcutaneous TID WC  . insulin glargine  20 Units Subcutaneous Daily  . latanoprost  1 drop Both Eyes QHS  . levothyroxine  150 mcg Oral QAC breakfast  . linagliptin  5 mg Oral Daily  . loratadine  10 mg Oral QODAY  . pantoprazole  40 mg Oral Daily  . polyethylene glycol  17 g Oral Daily  . senna-docusate  1 tablet Oral BID  . sildenafil  20 mg Oral TID  . sodium chloride flush  3 mL Intravenous Q12H    Infusions:   PRN Medications: acetaminophen, diphenhydrAMINE, HYDROmorphone (DILAUDID) injection, lidocaine, lidocaine, lidocaine-EPINEPHrine, neomycin-bacitracin-polymyxin, nitroGLYCERIN, ondansetron (ZOFRAN) IV, oxyCODONE-acetaminophen, oxymetazoline, silver nitrate applicators, sodium chloride, traZODone    Patient Profile   Patricia Mata is a 65 y.o. 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 Karle Starch.  Assessment/Plan   1. NSTEMI - peak troponin 7,352 - Cath 2019 no CAD  - s/p 3 u PRBC's transfusion 10/11/20  - repeat LHC 4/1 with multivessel disease Mid LM 60% stenosis, Mid RCA 20%, Mid LAD 30%, cost Cx to Prox Cx 30%, managing medically   2. Severe aortic stenosis and moderate mitral stenosis - Echo 09/2020 EF 50-55% AVA 0.71, mean gradient 65 compared with 0.84 and 66 on 05/2020 - Seen by structural team to evaluate for possible TAVR however TAVR CTs with nearly occlusive distal aorta - anatomy unfavorable for access -RHC 4/1: AoV: Crossed easily with JR4 and J-wire. Peak-to-peak gradient 32mmHg  Mean gradient 62mmHG AVA 1.4cm2 - No plans for intervention currently, ongoing discussions with multidisciplinary team to follow.  Has follow up with Dr. Burt Knack in one month   3. PAH - suspect multifactorial in nature suspect including high output due to anemia, WHO group II (diastolic HF and valvular disease) and III (OSA) but given degree of PH cannot exclude WHO Group I component. - RHC 2019 with severe PAH; repeat [facility] 04/2019 with improved PA pressures - prior VQ negative, PFTs 8/20 with normal spirometry and moderately reduced DLCO. -RHC PA mean 43 moderate to severe due to high output - on sildenafil, continue  4. CKD stage V - nephrology following had [facility] placement and 3rd HD session 10/14/2020 - AVF in place  (placed 09/26/20) - Awaiting getting set up at outpatient dialysis center   5. Paroxysmal atrial fibrillation - maintaining NSR on home amiodarone - not on OAC due to transfusion dependent anemia  6. Chronic diastolic CHF - EF normal but with severe AS, MS and G2DD - Volume improving with HD  7. MDS with severe transfusion-dependent anemia -Diagnosed in 2017 treated with 5-azacytidine finished 7 cycles of therapy completed in 2017.  -Currently only on a hematopoiesis stimulator  luspatercept , last oncology note Alen Blew) says any further treatments would be palliative - worsened by recurrent epistaxis - hgb 4.9 on admit - hgb 8.3 yesterday afternoon denies further bleeding  8. Severe epistaixs - s/p nasal packing 3/31 - on abx  9. Sinus Bradycardia -hold coreg for now, may be able to start back at lower dose later  Length of Stay: [place], MD  10/16/2020, 7:47 AM  Advanced Heart Failure Team Pager [phone] (M-F; 7a - 4p)  Please contact [facility] for night-coverage after hours (4p -7a ) and weekends on [email or web address]   Patient seen and examined with the above-signed Advanced Practice Provider and/or Housestaff. I personally reviewed laboratory data, imaging studies and relevant notes. I independently examined the patient and formulated the important aspects of the plan. I have edited the note to reflect any  of my changes or salient points. I have personally discussed the plan with the patient and/or family.  Feels ok. NO CP or SOB. Tolerating HD. Wants to go home. Nose still packed  General:  Sitting up. No resp difficulty HEENT: normal + L nasal packing  Neck: supple. no JVD. Carotids 2+ bilat; + bruits. No lymphadenopathy or thryomegaly appreciated. Cor: PMI nondisplaced. Regular rate & rhythm. 3/6 AS Lungs: clear Abdomen: obese soft, nontender, nondistended. No hepatosplenomegaly. No bruits or masses. Good bowel sounds. Extremities: no cyanosis, clubbing, rash, edema Neuro: alert & orientedx3, cranial nerves grossly intact. moves all 4 extremities w/o difficulty. Affect pleasant  She is stable for d/c. Volume status improved with HD. Continue amio and atorva. Would wean off clonidine if at all possible. Has f/u with Structural Team. We will arrange f/u in HF Clinic as well. Will need nasal packing removed this week.   Glori Bickers, MD  1:12 PM

## 2020-10-17 DIAGNOSIS — I342 Nonrheumatic mitral (valve) stenosis: Secondary | ICD-10-CM | POA: Insufficient documentation

## 2020-10-18 ENCOUNTER — Telehealth: Payer: Self-pay | Admitting: *Deleted

## 2020-10-18 ENCOUNTER — Telehealth: Payer: Self-pay | Admitting: Nephrology

## 2020-10-18 NOTE — Telephone Encounter (Signed)
Phone call received from patient, she is unable to keep her appointment tomorrow, 0/0/174 due to conflicting dialysis appointment.  Message sent to scheduling to reschedule tomorrow's appointment, informed them of patient's dialysis schedule.

## 2020-10-18 NOTE — Telephone Encounter (Signed)
Transition of Care Contact from [place]   Date of Discharge: 10/16/20 Date of Contact: 10/18/20 Method of contact: phone Talked to patient   Patient contacted to discuss transition of care form recent hospitaliztion. Patient was admitted to [facility] from 10/09/20 to 10/16/20 with the discharge diagnosis of new ESRD started on HD, NSTEMI, severe AS and moderate MS.   Medication changes were reviewed - stopped lasix and bicarb, advised to hold hydralazine and clonidine prior to HD.   Patient will follow up with is outpatient dialysis center 10/19/20.   Other follow up needs include - none identified.   Jen Mow, PA-C [facility] Pager: [phone]

## 2020-10-19 ENCOUNTER — Inpatient Hospital Stay: Payer: Medicare Other | Admitting: Oncology

## 2020-10-19 ENCOUNTER — Telehealth: Payer: Self-pay

## 2020-10-19 ENCOUNTER — Telehealth: Payer: Self-pay | Admitting: Oncology

## 2020-10-19 NOTE — Telephone Encounter (Signed)
R/s appts per 4/6 sch msg. Pt aware.  

## 2020-10-19 NOTE — Telephone Encounter (Signed)
Transition Care Management Follow-up Telephone Call  Date of discharge and from where: [place] 10/16/20  How have you been since you were released from the hospital? shes fine   Any questions or concerns? none  Items Reviewed:  Did the pt receive and understand the discharge instructions provided? yes  Medications obtained and verified? Yes she was taken off 2 meds   Any new allergies since your discharge? none  Dietary orders reviewed? Limit sodium and fluid intake   Do you have support at home? Yes   Other (ie: DME, Home Health, etc) no  Functional Questionnaire: (I = Independent and D = Dependent)  Bathing/Dressing- I   Meal Prep- I  Eating- I  Maintaining continence- I  Transferring/Ambulation- I  Managing Meds- I   Follow up appointments reviewed:    PCP Hospital f/u appt confirmed?  [name] DNP,FNP-BC 10/25/20 at [facility] f/u appt confirmed? Yes she has an appt doesn't know the name of the dr  Are transportation arrangements needed? Yes for dialysis she would like some   If their condition worsens, is the pt aware to call  their PCP or go to the ED? yes  Was the patient provided with contact information for the PCP's office or ED? Yes   Was the pt encouraged to call back with questions or concerns? Yes

## 2020-10-23 MED FILL — Heparin Sodium (Porcine) Inj 1000 Unit/ML: INTRAMUSCULAR | Qty: 1 | Status: AC

## 2020-10-25 ENCOUNTER — Encounter: Payer: Self-pay | Admitting: Nurse Practitioner

## 2020-10-25 ENCOUNTER — Other Ambulatory Visit: Payer: Self-pay

## 2020-10-25 ENCOUNTER — Telehealth: Payer: Self-pay

## 2020-10-25 ENCOUNTER — Ambulatory Visit (INDEPENDENT_AMBULATORY_CARE_PROVIDER_SITE_OTHER): Payer: Medicare Other | Admitting: Nurse Practitioner

## 2020-10-25 VITALS — BP 142/58 | HR 88 | Temp 98.5°F | Ht 63.0 in | Wt 201.6 lb

## 2020-10-25 DIAGNOSIS — D631 Anemia in chronic kidney disease: Secondary | ICD-10-CM

## 2020-10-25 DIAGNOSIS — E039 Hypothyroidism, unspecified: Secondary | ICD-10-CM

## 2020-10-25 DIAGNOSIS — E1122 Type 2 diabetes mellitus with diabetic chronic kidney disease: Secondary | ICD-10-CM | POA: Diagnosis not present

## 2020-10-25 DIAGNOSIS — N185 Chronic kidney disease, stage 5: Secondary | ICD-10-CM | POA: Diagnosis not present

## 2020-10-25 DIAGNOSIS — Z794 Long term (current) use of insulin: Secondary | ICD-10-CM

## 2020-10-25 DIAGNOSIS — Z992 Dependence on renal dialysis: Secondary | ICD-10-CM

## 2020-10-25 DIAGNOSIS — I350 Nonrheumatic aortic (valve) stenosis: Secondary | ICD-10-CM

## 2020-10-25 DIAGNOSIS — Z6835 Body mass index (BMI) 35.0-35.9, adult: Secondary | ICD-10-CM

## 2020-10-25 DIAGNOSIS — Z87891 Personal history of nicotine dependence: Secondary | ICD-10-CM

## 2020-10-25 DIAGNOSIS — I214 Non-ST elevation (NSTEMI) myocardial infarction: Secondary | ICD-10-CM

## 2020-10-25 DIAGNOSIS — N184 Chronic kidney disease, stage 4 (severe): Secondary | ICD-10-CM

## 2020-10-25 DIAGNOSIS — N186 End stage renal disease: Secondary | ICD-10-CM | POA: Diagnosis not present

## 2020-10-25 NOTE — Progress Notes (Signed)
Rutherford Nail as a scribe for [name], FNP.,have documented all relevant documentation on the behalf of [name], FNP,as directed by  [name], FNP while in the presence of [name], [place]. This visit occurred during the SARS-CoV-2 public health emergency.  Safety protocols were in place, including screening questions prior to the visit, additional usage of staff PPE, and extensive cleaning of exam room while observing appropriate contact time as indicated for disinfecting solutions.  Subjective:     Patient ID: Patricia Mata , female    DOB: 11/20/1955 , 65 y.o.   MRN: [id number]   Chief Complaint  Patient presents with  . Hospitalization Follow-up    HPI  Pt here for hospital f/u admission from 3/28-4/4 who presented to the emergency room with chest pain, she was found to have elevated troponins, severe aortic stenosis (chronic), fluid overload and since then admitted to the hospital and treated with dialysis, cardiac catheterization. She was diagnosed with NonSTEMI, she had a cardiac cath on 4/1 with diffuse coronary artery disease. She is unable to tolerate aspirin due to ongoing epistaxis and blood loss anemia. She continues to have severe aortic stenosis and moderate mitral valve stenosis and is followed by the Heart failure team.    she is on dialysis ESRD/CKD stage V with fluid overload. She has atrial fibrillation and on amniodarone. 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 herHemoglobin was more than 7 during this hospitalization.   She continues to have Recurrent epistaxis which had to be Cauterized and packed by ENT Dr. Constance Holster. Her packing was removed from the left nostril prior to discharge.she was treated with antibiotics for 5 days for prophylaxis. She was referred to ENT for follow up.  She had been having chest pain and left arm pain for about 2 days then  on the 3rd night she went to the ER at 3 am in the morning - Hgb was down to 4, she had 3-4 units PRBCs.   She is now on dialysis - TTh and Saturday - so far she is doing okay. She is going to [facility] on [address].    She is in good spirits.  She has also had been having nose bleeds. She even had a nose bleed while in the hospital. She reports she has had episodes of nose bleeds for about 7 hours.  Overall she is feeling good and no longer having chest pain.   She is on insulin at 20 units of Basaglar    Past Medical History:  Diagnosis Date  . Acute renal failure (ARF) ([place]) 06/08/2018   TTHS- Jeneen Rinks  . Carotid artery disease ([place])   . Chronic diastolic (congestive) heart failure ([place])   . Colon cancer ([place]) 2008  . Diabetes mellitus ([place])   . Dyspnea   . Gout 09/08/2018  . Heart attack ([place])   . Heart murmur   . Hypertension   . Hypothyroidism   . Macrocytic anemia   . MDS (myelodysplastic syndrome) ([place])   . Moderate mitral stenosis   . PAD (peripheral artery disease) ([place])   . PAF (paroxysmal atrial fibrillation) ([place])   . Pulmonary hypertension ([place])   . Renal artery stenosis ([facility])    a. >60% R renal artery stenosis by duplex 2021, managed medically.  . Severe aortic stenosis   . Stage 4 chronic kidney disease ([place])   . Transfusion-dependent anemia      Family History  Problem Relation Age of Onset  . Hypertension Mother   . Diabetes Mother   . Cervical cancer Mother   . Heart attack Father   . Hypertension Sister   . Hypertension Brother   . Hypertension Sister   . Hypertension Sister   . Prostate cancer Brother   . HIV/AIDS Brother      Current Outpatient Medications:  .  allopurinol (ZYLOPRIM) 300 MG tablet, Take 300 mg by mouth daily., Disp: , Rfl:  .  amiodarone (PACERONE) 200 MG tablet, Take 1 tablet (200 mg total) by mouth daily., Disp: 90 tablet, Rfl: 3 .  blood glucose meter kit and supplies, Dispense based on patient and insurance preference.  Use up to four times daily as directed. (FOR ICD-10 E10.9, E11.9). (Patient taking differently: 1 each by Other route See admin instructions. Dispense based on patient and insurance preference. Use up to four times daily as directed. (FOR ICD-10 E10.9, E11.9).), Disp: 1 each, Rfl: 3 .  Blood Glucose Monitoring Suppl (ONETOUCH ULTRALINK) w/Device KIT, Check blood sugars twice daily E11.9 (Patient taking differently: 1 each by Other route 2 (two) times daily. Check blood sugars twice daily E11.9), Disp: 1 kit, Rfl: 0 .  cloNIDine (CATAPRES) 0.1 MG tablet, Take 1 tablet (0.1 mg total) by mouth 2 (two) times daily., Disp: 60 tablet, Rfl: 11 .  fluticasone (FLONASE) 50 MCG/ACT nasal spray, Place 1 spray into both nostrils daily., Disp: 16 g, Rfl: 2 .  glucose blood test strip, check blood sugars twice daily Dx code E11.22 (Patient taking differently: 1 each by Other route See admin instructions. check blood sugars twice daily Dx code E11.22), Disp: 100 each, Rfl: 3 .  hydrALAZINE (APRESOLINE) 100 MG tablet, Take 1 tablet (100 mg total) by mouth 3 (three) times daily., Disp: 90 tablet, Rfl: 11 .  Insulin Pen Needle 29G X 5MM MISC, Use as directed (Patient taking differently: 1 each by Other route as directed.), Disp: 200 each, Rfl: 0 .  Lancets (ONETOUCH DELICA PLUS [id number]) MISC, check blood sugars twice daily Dx code: E11.22 (Patient taking differently: 1 each by Other route See admin instructions. check blood sugars twice daily Dx code: E11.22), Disp: 100 each, Rfl: 3 .  latanoprost (XALATAN) 0.005 % ophthalmic solution, Place 1 drop into both eyes at bedtime. , Disp: , Rfl:  .  levothyroxine (SYNTHROID) 150 MCG tablet, Take 1 tablet (150 mcg total) by mouth daily before breakfast., Disp: 30 tablet, Rfl: 2 .  loratadine (CLARITIN) 10 MG tablet, Take 10 mg by mouth daily., Disp: , Rfl:  .  Multiple Vitamins-Minerals (CENTRUM SILVER 50+WOMEN) TABS, Take 1 tablet by mouth daily., Disp: , Rfl:  .   polyethylene glycol (MIRALAX / GLYCOLAX) 17 g packet, Take 17 g by mouth daily., Disp: 14 each, Rfl: 0 .  sildenafil (REVATIO) 20 MG tablet, Take 1 tablet (20 mg total) by mouth 3 (three) times daily., Disp: 90 tablet, Rfl: 3 .  atorvastatin (LIPITOR) 40 MG tablet, Take 1 tablet by mouth once daily, Disp: 90 tablet, Rfl: 1 .  carvedilol (COREG) 3.125 MG tablet, Take 1 tablet (3.125 mg total) by mouth 2 (two) times daily., Disp: 60 tablet, Rfl: 3 .  colchicine 0.6 MG tablet, Take 0.6 mg by mouth daily as needed (gout)., Disp: , Rfl:  .  Insulin Glargine (BASAGLAR KWIKPEN) 100 UNIT/ML, Inject 15 Units into the skin daily. Only if above 150, Disp: , Rfl:  .  linagliptin (TRADJENTA) 5 MG TABS tablet, Take 1 tablet (  5 mg total) by mouth daily., Disp: 90 tablet, Rfl: 1 .  omeprazole (PRILOSEC) 40 MG capsule, TAKE 1 CAPSULE BY MOUTH TWICE DAILY BEFORE A MEAL, Disp: 180 capsule, Rfl: 0 .  sodium bicarbonate 650 MG tablet, 2 tablets, Disp: , Rfl:    Allergies  Allergen Reactions  . Ancef [Cefazolin] Itching    Severe itching- after procedure, ancef was the antibiotic.-04/02/17 Tolerates penicillins  . Lactose Intolerance (Gi) Diarrhea  . Sulfa Antibiotics Rash and Other (See Comments)    Blisters, also     Review of Systems  Constitutional: Negative.  Negative for fatigue.  HENT: Negative.   Respiratory: Negative.   Cardiovascular: Negative.   Endocrine: Negative for polydipsia, polyphagia and polyuria.  Musculoskeletal: Negative.   Skin: Negative.   Neurological: Negative for dizziness and headaches.  Psychiatric/Behavioral: Negative.      Today's Vitals   10/25/20 1207  BP: (!) 142/58  Pulse: 88  Temp: 98.5 F (36.9 C)  TempSrc: Oral  Weight: 201 lb 9.6 oz (91.4 kg)  Height: '5\' 3"'  (1.6 m)  PainSc: 0-No pain   Body mass index is 35.71 kg/m.   Objective:  Physical Exam Vitals reviewed.  Constitutional:      General: She is not in acute distress.    Appearance: Normal  appearance. She is obese.  HENT:     Head: Normocephalic.  Eyes:     Extraocular Movements: Extraocular movements intact.     Conjunctiva/sclera: Conjunctivae normal.     Pupils: Pupils are equal, round, and reactive to light.  Cardiovascular:     Rate and Rhythm: Normal rate and regular rhythm.     Pulses: Normal pulses.     Heart sounds: Normal heart sounds. No murmur heard.   Pulmonary:     Effort: Pulmonary effort is normal. No respiratory distress.     Breath sounds: Normal breath sounds. No wheezing.  Skin:    General: Skin is warm and dry.     Capillary Refill: Capillary refill takes less than 2 seconds.  Neurological:     General: No focal deficit present.     Mental Status: She is alert and oriented to person, place, and time.     Cranial Nerves: No cranial nerve deficit.     Motor: No weakness.  Psychiatric:        Mood and Affect: Mood normal.        Behavior: Behavior normal.        Thought Content: Thought content normal.        Judgment: Judgment normal.         Assessment And Plan:     1. NSTEMI (non-ST elevated myocardial infarction) ([place])  TCM Performed. A member of the clinical team spoke with the patient upon dischare. Discharge summary was reviewed in full detail during the visit. Meds reconciled and compared to discharge meds. Medication list is updated and reviewed with the patient.  Greater than 50% face to face time was spent in counseling an coordination of care.  All questions were answered to the satisfaction of the patient.    She remains on beta-blockers and hydralazine.  Her Cardiac cath on 4/1 showed diffuse coronary artery disease, not favorable for TAVR as per cardiology report according to hospital discharge summary   She is unable to tolerate aspirin due to ongoing epistaxis and blood loss anemia.    2. Type 2 diabetes mellitus with stage 4 chronic kidney disease, with long-term current use of insulin ([place])  Chronic,  controlled  Continue with current medications  Encouraged to limit intake of sugary foods and drinks  Encouraged to increase physical activity to 150 minutes per week - Hemoglobin A1c  3. Acquired hypothyroidism  Chronic, will check thyroid levels and make changes to medications as necessary - T3, free - T4 - TSH  4. Aortic stenosis, severe  She has Severe aortic stenosis and moderate mitral stenosis: Followed by heart failure team.   Per discharge summary CT scans and cardiac cath not favorable for TAVR as per cardiology.    Patient was given opportunity to ask questions. Patient verbalized understanding of the plan and was able to repeat key elements of the plan. All questions were answered to their satisfaction.  [name], FNP   I, [name], FNP, have reviewed all documentation for this visit. The documentation on 10/25/20 for the exam, diagnosis, procedures, and orders are all accurate and complete.   IF YOU HAVE BEEN REFERRED TO A SPECIALIST, IT MAY TAKE 1-2 WEEKS TO SCHEDULE/PROCESS THE REFERRAL. IF YOU HAVE NOT HEARD FROM US/SPECIALIST IN TWO WEEKS, PLEASE GIVE [place] A CALL AT [phone].   THE PATIENT IS ENCOURAGED TO PRACTICE SOCIAL DISTANCING DUE TO THE COVID-19 PANDEMIC.

## 2020-10-25 NOTE — Chronic Care Management (AMB) (Signed)
Chronic Care Management Pharmacy Assistant   Name: Patricia Mata  MRN: [id number] DOB: [birth year]  Reason for Encounter: Patient Assistance Coordination   10/25/2020- Patient inquired about status of Tradjenta with [place] patient assistance program. Application mailed 4/74/2595, called [facility], spoke with representative, they do not see an application on file. Refaxing application, Patricia Mata, CPP notified, will f/u with [facility] and patient.   11/15/2020- Received denial letter from [place] from 10/26/2020, patient exceeds the income eligibility limit. Called patient to inform, no answer left message to return call.  Patient returned call, she is aware that [place] care denied patient assistance medication, patient is wondering what other medication can she try, she is unable to afford Tradjenta, she said it was about $600 from the pharmacy. Patient aware I will send a message to [facility]. CPP to suggest change and will ask PCP office if samples are available.  11/16/2020- PCP office does have samples of Tradjenta for patient to use until new determination on medication. PharmD suggested Januvia as alternative medication but will give strength and directions once discussed with PCP. Called patient to inform, no answer, left message to return call.  Medications: Outpatient Encounter Medications as of 10/25/2020  Medication Sig  . allopurinol (ZYLOPRIM) 300 MG tablet Take 300 mg by mouth daily.  Marland Kitchen amiodarone (PACERONE) 200 MG tablet Take 1 tablet (200 mg total) by mouth daily.  Marland Kitchen atorvastatin (LIPITOR) 40 MG tablet Take 1 tablet by mouth once daily (Patient taking differently: Take 40 mg by mouth daily.)  . blood glucose meter kit and supplies Dispense based on patient and insurance preference. Use up to four times daily as directed. (FOR ICD-10 E10.9, E11.9).  Marland Kitchen Blood Glucose Monitoring Suppl (ONETOUCH ULTRALINK) w/Device KIT Check blood sugars twice daily E11.9  . cloNIDine (CATAPRES) 0.1  MG tablet Take 1 tablet (0.1 mg total) by mouth 2 (two) times daily.  . fluticasone (FLONASE) 50 MCG/ACT nasal spray Place 1 spray into both nostrils daily.  . furosemide (LASIX) 40 MG tablet Take 1 tablet (40 mg total) by mouth daily. Take 80 mg in AM and 40 mg in PM (4PM)  . glucose blood test strip check blood sugars twice daily Dx code E11.22  . hydrALAZINE (APRESOLINE) 100 MG tablet Take 1 tablet (100 mg total) by mouth 3 (three) times daily.  . Insulin Glargine (BASAGLAR KWIKPEN) 100 UNIT/ML Inject 20 Units into the skin daily.  . Insulin Pen Needle 29G X 5MM MISC Use as directed  . Lancets (ONETOUCH DELICA PLUS [id number]) MISC check blood sugars twice daily Dx code: E11.22  . latanoprost (XALATAN) 0.005 % ophthalmic solution Place 1 drop into both eyes at bedtime.   Marland Kitchen levothyroxine (SYNTHROID) 150 MCG tablet Take 1 tablet (150 mcg total) by mouth daily before breakfast.  . linagliptin (TRADJENTA) 5 MG TABS tablet Take 1 tablet (5 mg total) by mouth daily.  Marland Kitchen loratadine (CLARITIN) 10 MG tablet Take 10 mg by mouth daily.  . Multiple Vitamins-Minerals (CENTRUM SILVER 50+WOMEN) TABS Take 1 tablet by mouth daily.  Marland Kitchen omeprazole (PRILOSEC) 40 MG capsule Take 1 capsule (40 mg total) by mouth 2 (two) times daily before a meal.  . polyethylene glycol (MIRALAX / GLYCOLAX) 17 g packet Take 17 g by mouth daily.  Marland Kitchen senna-docusate (SENOKOT-S) 8.6-50 MG tablet Take 1 tablet by mouth 2 (two) times daily.  . sildenafil (REVATIO) 20 MG tablet Take 1 tablet (20 mg total) by mouth 3 (three) times daily.  Marland Kitchen  sodium bicarbonate 650 MG tablet Take 650 mg by mouth 2 (two) times daily.  . sodium chloride (OCEAN) 0.65 % nasal spray Place 1 spray into the nose in the morning, at noon, in the evening, and at bedtime.   No facility-administered encounter medications on file as of 10/25/2020.    Star Rating Drugs: ATORVASTATIN 40MG - Last filled 08/09/2020 for 90 day supply at [facility] 50 MG- Last filled  07/12/2019 for 90 day supply at [facility] JANUMET XR 50-500MG- Last filled 03/16/2019 [facility] day supply at [facility]. TRADJENTA 5 MG - Last filled 03/06/2020 for 90 day supply at [facility].     SIG: [name], [place] [phone]

## 2020-10-26 ENCOUNTER — Encounter (HOSPITAL_COMMUNITY): Payer: Medicare Other

## 2020-10-26 LAB — HEMOGLOBIN A1C
Est. average glucose Bld gHb Est-mCnc: 103 mg/dL
Hgb A1c MFr Bld: 5.2 % (ref 4.8–5.6)

## 2020-10-27 ENCOUNTER — Encounter: Payer: Self-pay | Admitting: *Deleted

## 2020-10-27 ENCOUNTER — Inpatient Hospital Stay: Payer: Medicare Other

## 2020-10-27 ENCOUNTER — Other Ambulatory Visit: Payer: Self-pay | Admitting: *Deleted

## 2020-10-27 ENCOUNTER — Telehealth: Payer: Self-pay

## 2020-10-27 ENCOUNTER — Other Ambulatory Visit: Payer: Self-pay

## 2020-10-27 ENCOUNTER — Emergency Department (HOSPITAL_COMMUNITY)
Admission: EM | Admit: 2020-10-27 | Discharge: 2020-10-27 | Disposition: A | Discharge status: Home / Self Care | Payer: Medicare Other | Attending: Emergency Medicine | Admitting: Emergency Medicine

## 2020-10-27 ENCOUNTER — Encounter (HOSPITAL_COMMUNITY): Payer: Self-pay

## 2020-10-27 DIAGNOSIS — D649 Anemia, unspecified: Secondary | ICD-10-CM | POA: Diagnosis not present

## 2020-10-27 DIAGNOSIS — Z794 Long term (current) use of insulin: Secondary | ICD-10-CM | POA: Diagnosis not present

## 2020-10-27 DIAGNOSIS — I132 Hypertensive heart and chronic kidney disease with heart failure and with stage 5 chronic kidney disease, or end stage renal disease: Secondary | ICD-10-CM | POA: Diagnosis not present

## 2020-10-27 DIAGNOSIS — Z20822 Contact with and (suspected) exposure to covid-19: Secondary | ICD-10-CM | POA: Insufficient documentation

## 2020-10-27 DIAGNOSIS — Z85038 Personal history of other malignant neoplasm of large intestine: Secondary | ICD-10-CM | POA: Insufficient documentation

## 2020-10-27 DIAGNOSIS — Z79899 Other long term (current) drug therapy: Secondary | ICD-10-CM | POA: Diagnosis not present

## 2020-10-27 DIAGNOSIS — Z7984 Long term (current) use of oral hypoglycemic drugs: Secondary | ICD-10-CM | POA: Diagnosis not present

## 2020-10-27 DIAGNOSIS — R079 Chest pain, unspecified: Secondary | ICD-10-CM

## 2020-10-27 DIAGNOSIS — R799 Abnormal finding of blood chemistry, unspecified: Secondary | ICD-10-CM | POA: Diagnosis present

## 2020-10-27 DIAGNOSIS — E039 Hypothyroidism, unspecified: Secondary | ICD-10-CM | POA: Insufficient documentation

## 2020-10-27 DIAGNOSIS — Z87891 Personal history of nicotine dependence: Secondary | ICD-10-CM | POA: Insufficient documentation

## 2020-10-27 DIAGNOSIS — D631 Anemia in chronic kidney disease: Secondary | ICD-10-CM | POA: Diagnosis not present

## 2020-10-27 DIAGNOSIS — N185 Chronic kidney disease, stage 5: Secondary | ICD-10-CM | POA: Diagnosis not present

## 2020-10-27 DIAGNOSIS — N189 Chronic kidney disease, unspecified: Secondary | ICD-10-CM | POA: Diagnosis not present

## 2020-10-27 DIAGNOSIS — I5032 Chronic diastolic (congestive) heart failure: Secondary | ICD-10-CM | POA: Insufficient documentation

## 2020-10-27 DIAGNOSIS — D469 Myelodysplastic syndrome, unspecified: Secondary | ICD-10-CM

## 2020-10-27 HISTORY — DX: Acute myocardial infarction, unspecified: I21.9

## 2020-10-27 LAB — CBC WITH DIFFERENTIAL/PLATELET
Abs Immature Granulocytes: 0.02 10*3/uL (ref 0.00–0.07)
Basophils Absolute: 0 10*3/uL (ref 0.0–0.1)
Basophils Relative: 0 %
Eosinophils Absolute: 0.1 10*3/uL (ref 0.0–0.5)
Eosinophils Relative: 1 %
HCT: 17.6 % — ABNORMAL LOW (ref 36.0–46.0)
Hemoglobin: 5.8 g/dL — CL (ref 12.0–15.0)
Immature Granulocytes: 0 %
Lymphocytes Relative: 11 %
Lymphs Abs: 0.7 10*3/uL (ref 0.7–4.0)
MCH: 31.9 pg (ref 26.0–34.0)
MCHC: 33 g/dL (ref 30.0–36.0)
MCV: 96.7 fL (ref 80.0–100.0)
Monocytes Absolute: 0.9 10*3/uL (ref 0.1–1.0)
Monocytes Relative: 16 %
Neutro Abs: 4.3 10*3/uL (ref 1.7–7.7)
Neutrophils Relative %: 72 %
Platelets: 181 10*3/uL (ref 150–400)
RBC: 1.82 MIL/uL — ABNORMAL LOW (ref 3.87–5.11)
RDW: 18.2 % — ABNORMAL HIGH (ref 11.5–15.5)
WBC: 6 10*3/uL (ref 4.0–10.5)
nRBC: 0.8 % — ABNORMAL HIGH (ref 0.0–0.2)

## 2020-10-27 LAB — CBC WITH DIFFERENTIAL (CANCER CENTER ONLY)
Abs Immature Granulocytes: 0.05 10*3/uL (ref 0.00–0.07)
Basophils Absolute: 0 10*3/uL (ref 0.0–0.1)
Basophils Relative: 0 %
Eosinophils Absolute: 0.1 10*3/uL (ref 0.0–0.5)
Eosinophils Relative: 1 %
HCT: 17.2 % — ABNORMAL LOW (ref 36.0–46.0)
Hemoglobin: 5.6 g/dL — CL (ref 12.0–15.0)
Immature Granulocytes: 1 %
Lymphocytes Relative: 11 %
Lymphs Abs: 0.8 10*3/uL (ref 0.7–4.0)
MCH: 31.1 pg (ref 26.0–34.0)
MCHC: 32.6 g/dL (ref 30.0–36.0)
MCV: 95.6 fL (ref 80.0–100.0)
Monocytes Absolute: 1.2 10*3/uL — ABNORMAL HIGH (ref 0.1–1.0)
Monocytes Relative: 17 %
Neutro Abs: 5 10*3/uL (ref 1.7–7.7)
Neutrophils Relative %: 70 %
Platelet Count: 175 10*3/uL (ref 150–400)
RBC: 1.8 MIL/uL — ABNORMAL LOW (ref 3.87–5.11)
RDW: 18.2 % — ABNORMAL HIGH (ref 11.5–15.5)
WBC Count: 7.2 10*3/uL (ref 4.0–10.5)
nRBC: 1.3 % — ABNORMAL HIGH (ref 0.0–0.2)

## 2020-10-27 LAB — COMPREHENSIVE METABOLIC PANEL
ALT: 60 U/L — ABNORMAL HIGH (ref 0–44)
AST: 98 U/L — ABNORMAL HIGH (ref 15–41)
Albumin: 3.6 g/dL (ref 3.5–5.0)
Alkaline Phosphatase: 131 U/L — ABNORMAL HIGH (ref 38–126)
Anion gap: 11 (ref 5–15)
BUN: 37 mg/dL — ABNORMAL HIGH (ref 8–23)
CO2: 28 mmol/L (ref 22–32)
Calcium: 9 mg/dL (ref 8.9–10.3)
Chloride: 95 mmol/L — ABNORMAL LOW (ref 98–111)
Creatinine, Ser: 2.08 mg/dL — ABNORMAL HIGH (ref 0.44–1.00)
GFR, Estimated: 26 mL/min — ABNORMAL LOW (ref >60)
Glucose, Bld: 96 mg/dL (ref 70–99)
Potassium: 3.8 mmol/L (ref 3.5–5.1)
Sodium: 134 mmol/L — ABNORMAL LOW (ref 135–145)
Total Bilirubin: 0.8 mg/dL (ref 0.3–1.2)
Total Protein: 6.7 g/dL (ref 6.5–8.1)

## 2020-10-27 LAB — RESP PANEL BY RT-PCR (FLU A&B, COVID) ARPGX2
Influenza A by PCR: NEGATIVE
Influenza B by PCR: NEGATIVE
SARS Coronavirus 2 by RT PCR: NEGATIVE

## 2020-10-27 LAB — PREPARE RBC (CROSSMATCH)

## 2020-10-27 LAB — TYPE AND SCREEN
ABO/RH(D): A NEG
Antibody Screen: NEGATIVE

## 2020-10-27 LAB — TROPONIN I (HIGH SENSITIVITY)
Troponin I (High Sensitivity): 48 ng/L — ABNORMAL HIGH (ref <18)
Troponin I (High Sensitivity): 49 ng/L — ABNORMAL HIGH (ref <18)

## 2020-10-27 LAB — SAMPLE TO BLOOD BANK

## 2020-10-27 MED ORDER — SODIUM CHLORIDE 0.9 % IV SOLN
10.0000 mL/h | Freq: Once | INTRAVENOUS | Status: AC
  Administered 2020-10-27: 10 mL/h via INTRAVENOUS

## 2020-10-27 NOTE — ED Notes (Signed)
Blood bank has 2 units of blood ready for this patient. Notified Abby,RN.

## 2020-10-27 NOTE — Progress Notes (Signed)
PC to patient informing her that Dr. Alen Blew is recommending that she take melatonin OTC for sleep, she states that doesn't work for her, Dr. Alen Blew informed.  Also informed patient that she will receive blood on Monday 10/30/20 as planned because she has dialysis on Saturday 10/28/20 and cannot come to infusion that day.  Patient states she is not comfortable waiting until Monday for blood transfusion & that she is going to the ED today.  Dr. Alen Blew informed.

## 2020-10-27 NOTE — Discharge Instructions (Addendum)
You were seen in the emergency department for low hemoglobin.  You received 2 units of packed red blood cells with improvement in your symptoms.  Please go to dialysis tomorrow as scheduled.  Follow-up with your oncologist.  Return to the emergency department for any worsening or concerning symptoms.

## 2020-10-27 NOTE — ED Triage Notes (Signed)
Patient was sent from the cancer center for a Hgb-5.6.

## 2020-10-27 NOTE — Telephone Encounter (Signed)
CRITICAL VALUE STICKER  CRITICAL VALUE: Hgb = 5.6  RECEIVER (on-site recipient of call): Yetta Glassman, CMA  DATE & TIME NOTIFIED: 10/27/20 at 12:43pm  MESSENGER (representative from lab): Suanne Marker  MD NOTIFIED: Dr. Alen Blew  TIME OF NOTIFICATION: 10/27/20 at 12:50pm  RESPONSE: Notification given directly to Dr. Alen Blew for follow-up with th ept.

## 2020-10-27 NOTE — ED Provider Notes (Signed)
[facility] DEPT Provider Note   CSN: [id number] Arrival date & time: 10/27/20  1345     History Chief Complaint  Patient presents with  . Abnormal Lab    Patricia Mata is a 65 y.o. female  She has a history of dialysis and is a Tuesday Thursday Saturday.  Also has myelodysplastic syndrome and gets frequent transfusions.  Last transfusion was about 10 days ago.  During last admission she had an NSTEMI which was thought to be low due to her anemia.  She noticed a little bit of chest discomfort last evening so went to the cancer center today and they checked her hemoglobin and found it to be 5.6 no current chest pain.  No shortness of breath.  Has not noticed any obvious bleeding.  She says they have done a fairly thorough work-up for her anemia and have not found any source.   Abnormal Lab Time since result:  Hours Patient referred by:  Specialist Resulting agency:  Internal Result type: hematology   Hematology:    Hematocrit:  Low   Hemoglobin:  Low Chest Pain Pain location:  Substernal area Pain quality: pressure   Pain radiates to:  Does not radiate Pain severity:  Mild Onset quality:  Gradual Progression:  Resolved Chronicity:  Recurrent Context: at rest   Relieved by:  None tried Worsened by:  Nothing Ineffective treatments:  None tried Associated symptoms: no abdominal pain, no cough, no diaphoresis, no fever, no headache, no nausea, no shortness of breath and no vomiting   Risk factors: diabetes mellitus and hypertension        Past Medical History:  Diagnosis Date  . Acute renal failure (ARF) ([place]) 06/08/2018  . Carotid artery disease ([place])   . Chronic diastolic (congestive) heart failure ([place])   . Colon cancer ([place])   . Diabetes mellitus ([place])   . Gout 09/08/2018  . Heart attack ([place])   . Hypertension   . Hypothyroidism   . Macrocytic anemia   . MDS (myelodysplastic syndrome) ([place])   . Moderate mitral stenosis   .  PAD (peripheral artery disease) ([place])   . PAF (paroxysmal atrial fibrillation) ([place])   . Pulmonary hypertension ([place])   . Renal artery stenosis (H[facility])    a. >60% R renal artery stenosis by duplex 2021, managed medically.  . Severe aortic stenosis   . Stage 4 chronic kidney disease ([place])   . Transfusion-dependent anemia     Patient Active Problem List   Diagnosis Date Noted  . NSTEMI (non-ST elevated myocardial infarction) ([place]) 10/10/2020  . Angina pectoris ([place]) 10/09/2020  . Angina at rest [facility]) 10/09/2020  . Gastroesophageal reflux disease 09/12/2020  . History of malignant neoplasm of colon 09/12/2020  . Chronic gastritis 09/02/2020  . Symptomatic anemia 08/31/2020  . Abnormal uterine bleeding (AUB) 07/19/2020  . Vaginal ulceration 07/19/2020  . Acute renal failure superimposed on stage 4 chronic kidney disease ([place])   . Palliative care by specialist   . Acute on chronic diastolic CHF (congestive heart failure) ([place]) 06/07/2020  . Chronic cough 05/04/2020  . Allergic rhinitis 03/23/2020  . Chronic kidney disease, stage 5 ([place]) 02/03/2020  . Acute diastolic CHF (congestive heart failure) ([place]) 09/21/2019  . HCAP (healthcare-associated pneumonia) 09/20/2019  . Pulmonary hypertension ([place]) 09/20/2019  . DM2 (diabetes mellitus, type 2) ([place]) 09/20/2019  . Elevated troponin 09/20/2019  . Severe sepsis ([place]) 09/20/2019  . CAP (community acquired pneumonia) 09/14/2019  . Hypoxia 09/14/2019  . Epistaxis,  recurrent 08/10/2019  . Neutropenia ([place]) 05/25/2019  . Malignant neoplasm of colon ([place]) 05/25/2019  . Bradycardia 03/21/2019  . Pneumonia due to COVID-19 virus 03/18/2019  . Type 2 diabetes mellitus with hemoglobin A1c goal of less than 7.0% ([place]) 12/10/2018  . Hypothyroidism 12/10/2018  . Seasonal allergies 12/10/2018  . Gout 09/08/2018  . 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 Sacral wound 06/08/2018  . Iron deficiency anemia due to chronic blood loss  06/08/2018  . Hyponatremia 06/08/2018  . Constipation 06/08/2018  . Encounter for nasogastric (NG) tube placement   . Pressure injury of skin 05/22/2018  . PAF (paroxysmal atrial fibrillation) ([place]) 05/11/2018  . Normocytic anemia 06/14/2017  . Aortic stenosis, severe 06/10/2017  . Carotid artery disease ([place]) 06/10/2017  . Essential hypertension 05/13/2017  . Hyperlipidemia 05/13/2017  . Bilateral lower extremity edema 05/13/2017  . Port-A-Cath in place 04/28/2017  . MDS (myelodysplastic syndrome) ([place]) 03/20/2017  . Goals of care, counseling/discussion 03/20/2017  . Anemia in chronic kidney disease 05/10/2016  . Anemia in stage 1 chronic kidney disease 05/10/2016  . Anemia due to chronic kidney disease 02/09/2016    Past Surgical History:  Procedure Laterality Date  . AV FISTULA PLACEMENT Right 09/26/2020   Procedure: RIGHT ARM ARTERIOVENOUS (AV) FISTULA CREATION;  Surgeon: Waynetta Sandy, MD;  Location[facility];  Service: Vascular;  Laterality: Right;  Monitor Anesthesia Care with Regional Block to Right Arm   . COLON SURGERY    . DEBRIDMENT OF DECUBITUS ULCER N/A 06/12/2018   Procedure: DEBRIDMENT OF DECUBITUS ULCER;  Surgeon: Wallace Going, DO;  Location: [facility] ORS;  Service: Plastics;  Laterality: N/A;  . ESOPHAGOGASTRODUODENOSCOPY N/A 09/01/2020   Procedure: ESOPHAGOGASTRODUODENOSCOPY (EGD);  Surgeon: Clarene Essex, MD;  Location: [facility];  Service: Endoscopy;  Laterality: N/A;  . GIVENS CAPSULE STUDY N/A 09/01/2020   Procedure: GIVENS CAPSULE STUDY;  Surgeon: Clarene Essex, MD;  Location: [facility] ENDOSCOPY;  Service: Endoscopy;  Laterality: N/A;  . IR FLUORO GUIDE CV LINE RIGHT  10/11/2020  . IR FLUORO GUIDE PORT INSERTION RIGHT  04/02/2017  . IR REMOVAL TUN ACCESS W/ PORT W/O FL MOD SED  03/16/2019  . IR US GUIDE VASC ACCESS RIGHT  04/02/2017  . IR US GUIDE VASC ACCESS RIGHT  10/11/2020  . RIGHT HEART CATH N/A 04/23/2019   Procedure: RIGHT HEART CATH;  Surgeon: Jolaine Artist, MD;  Location: [facility] LAB;  Service: Cardiovascular;  Laterality: N/A;  . RIGHT/LEFT HEART CATH AND CORONARY ANGIOGRAPHY N/A 05/21/2018   Procedure: RIGHT/LEFT HEART CATH AND CORONARY ANGIOGRAPHY;  Surgeon: Nelva Bush, MD;  Location: [facility] LAB;  Service: Cardiovascular;  Laterality: N/A;  . RIGHT/LEFT HEART CATH AND CORONARY ANGIOGRAPHY N/A 10/13/2020   Procedure: RIGHT/LEFT HEART CATH AND CORONARY ANGIOGRAPHY;  Surgeon: Jolaine Artist, MD;  Location: [facility];  Service: Cardiovascular;  Laterality: N/A;  . TUBAL LIGATION       OB History   No obstetric history on file.     Family History  Problem Relation Age of Onset  . Hypertension Mother   . Diabetes Mother   . Cervical cancer Mother   . Heart attack Father   . Hypertension Sister   . Hypertension Brother   . Hypertension Sister   . Hypertension Sister   . Prostate cancer Brother   . HIV/AIDS Brother     Social History   Tobacco Use  . Smoking status: Former Smoker    Packs/day:  0.25    Years: 20.00    Pack years: 5.00    Types: Cigarettes    Quit date: 01/31/2001    Years since quitting: 19.7  . Smokeless tobacco: Former Network engineer  . Vaping Use: Never used  Substance Use Topics  . Alcohol use: Yes    Alcohol/week: 2.0 standard drinks    Types: 2 Shots of liquor per week    Comment: occ  . Drug use: Never    Home Medications Prior to Admission medications   Medication Sig Start Date End Date Taking? Authorizing Provider  allopurinol (ZYLOPRIM) 300 MG tablet Take 300 mg by mouth daily. 07/05/20   [provider]  amiodarone (PACERONE) 200 MG tablet Take 1 tablet (200 mg total) by mouth daily. 07/19/20   Bensimhon, Shaune Pascal, MD  atorvastatin (LIPITOR) 40 MG tablet Take 1 tablet by mouth once daily Patient taking differently: Take 40 mg by mouth daily. 08/09/20   [name], FNP  blood glucose meter kit and supplies Dispense based on patient and  insurance preference. Use up to four times daily as directed. (FOR ICD-10 E10.9, E11.9). 11/04/18   [name], FNP  Blood Glucose Monitoring Suppl [facility] Karsten Fells) w/Device KIT Check blood sugars twice daily E11.9 11/30/19   [name], FNP  cloNIDine (CATAPRES) 0.1 MG tablet Take 1 tablet (0.1 mg total) by mouth 2 (two) times daily. 07/03/20   Bensimhon, Shaune Pascal, MD  fluticasone (FLONASE) 50 MCG/ACT nasal spray Place 1 spray into both nostrils daily. 01/28/20   Collene Gobble, MD  furosemide (LASIX) 40 MG tablet Take 1 tablet (40 mg total) by mouth daily. Take 80 mg in AM and 40 mg in PM (4PM) 07/09/20   Debbe Odea, MD  glucose blood test strip check blood sugars twice daily Dx code E11.22 03/02/20   [name], FNP  hydrALAZINE (APRESOLINE) 100 MG tablet Take 1 tablet (100 mg total) by mouth 3 (three) times daily. 09/27/20   Lorretta Harp, MD  Insulin Glargine [facility]) 100 UNIT/ML Inject 20 Units into the skin daily. 09/05/20   [name], FNP  Insulin Pen Needle 29G X 5MM MISC Use as directed 06/16/17   Bonnielee Haff, MD  Lancets (ONETOUCH DELICA PLUS [id number]) [place] check blood sugars twice daily Dx code: E11.22 03/02/20   [name], FNP  latanoprost (XALATAN) 0.005 % ophthalmic solution Place 1 drop into both eyes at bedtime.     [provider]  levothyroxine (SYNTHROID) 150 MCG tablet Take 1 tablet (150 mcg total) by mouth daily before breakfast. 09/07/20   [name], FNP  linagliptin (TRADJENTA) 5 MG TABS tablet Take 1 tablet (5 mg total) by mouth daily. 02/03/20   [name], FNP  loratadine (CLARITIN) 10 MG tablet Take 10 mg by mouth daily.    [provider]  Multiple Vitamins-Minerals (CENTRUM SILVER 50+WOMEN) TABS Take 1 tablet by mouth daily.    [provider]  omeprazole (PRILOSEC) 40 MG capsule Take 1 capsule (40 mg total) by mouth 2 (two) times daily before a meal. 09/27/20 09/27/21  [name], FNP  polyethylene  glycol (MIRALAX / GLYCOLAX) 17 g packet Take 17 g by mouth daily. 10/16/20   Barb Merino, MD  senna-docusate (SENOKOT-S) 8.6-50 MG tablet Take 1 tablet by mouth 2 (two) times daily. 10/16/20 11/15/20  Barb Merino, MD  sildenafil (REVATIO) 20 MG tablet Take 1 tablet (20 mg total) by mouth 3 (three) times daily. 09/06/20   Bensimhon, Quillian Quince  R, MD  sodium bicarbonate 650 MG tablet Take 650 mg by mouth 2 (two) times daily. 06/27/20   [provider]  sodium chloride (OCEAN) 0.65 % nasal spray Place 1 spray into the nose in the morning, at noon, in the evening, and at bedtime.    [provider]    Allergies    Ancef [cefazolin], Lactose intolerance (gi), and Sulfa antibiotics  Review of Systems   Review of Systems  Constitutional: Negative for diaphoresis and fever.  HENT: Negative for sore throat.   Eyes: Negative for visual disturbance.  Respiratory: Negative for cough and shortness of breath.   Cardiovascular: Positive for chest pain.  Gastrointestinal: Negative for abdominal pain, nausea and vomiting.  Genitourinary: Negative for dysuria.  Musculoskeletal: Negative for neck pain.  Skin: Negative for rash.  Neurological: Negative for headaches.    Physical Exam Updated Vital Signs BP (!) 153/57   Pulse 84   Temp 99.3 F (37.4 C) (Oral)   Resp 16   Ht '5\' 3"'  (1.6 m)   Wt 91.4 kg   SpO2 100%   BMI 35.71 kg/m   Physical Exam Vitals and nursing note reviewed.  Constitutional:      General: She is not in acute distress.    Appearance: Normal appearance. She is well-developed.  HENT:     Head: Normocephalic and atraumatic.  Eyes:     Conjunctiva/sclera: Conjunctivae normal.  Cardiovascular:     Rate and Rhythm: Normal rate and regular rhythm.     Heart sounds: Murmur heard.    Pulmonary:     Effort: Pulmonary effort is normal. No respiratory distress.     Breath sounds: Normal breath sounds.     Comments: She has dialysis catheter in right upper chest  and fistula right arm. Abdominal:     Palpations: Abdomen is soft.     Tenderness: There is no abdominal tenderness.  Musculoskeletal:        General: No deformity or signs of injury. Normal range of motion.     Cervical back: Neck supple.  Skin:    General: Skin is warm and dry.  Neurological:     General: No focal deficit present.     Mental Status: She is alert.     ED Results / Procedures / Treatments   Labs (all labs ordered are listed, but only abnormal results are displayed) Labs Reviewed  COMPREHENSIVE METABOLIC PANEL - Abnormal; Notable for the following components:      Result Value   Sodium 134 (*)    Chloride 95 (*)    BUN 37 (*)    Creatinine, Ser 2.08 (*)    AST 98 (*)    ALT 60 (*)    Alkaline Phosphatase 131 (*)    GFR, Estimated 26 (*)    All other components within normal limits  CBC WITH DIFFERENTIAL/PLATELET - Abnormal; Notable for the following components:   RBC 1.82 (*)    Hemoglobin 5.8 (*)    HCT 17.6 (*)    RDW 18.2 (*)    nRBC 0.8 (*)    All other components within normal limits  TROPONIN I (HIGH SENSITIVITY) - Abnormal; Notable for the following components:   Troponin I (High Sensitivity) 48 (*)    All other components within normal limits  TROPONIN I (HIGH SENSITIVITY) - Abnormal; Notable for the following components:   Troponin I (High Sensitivity) 49 (*)    All other components within normal limits  RESP PANEL BY  RT-PCR (FLU A&B, COVID) ARPGX2  CBC WITH DIFFERENTIAL/PLATELET  TYPE AND SCREEN  PREPARE RBC (CROSSMATCH)    EKG EKG Interpretation  Date/Time:  Friday October 27 2020 15:12:32 EDT Ventricular Rate:  75 PR Interval:  173 QRS Duration: 86 QT Interval:  432 QTC Calculation: 483 R Axis:   27 Text Interpretation: Sinus rhythm Borderline repolarization abnormality No significant change since prior 3/22 Confirmed by Aletta Edouard [phone]) on 10/27/2020 3:16:05 PM   Radiology No results found.  Procedures .Critical  Care Performed by: Hayden Rasmussen, MD Authorized by: Hayden Rasmussen, MD   Critical care provider statement:    Critical care time (minutes):  45   Critical care was necessary to treat or prevent imminent or life-threatening deterioration of the following conditions:  Circulatory failure   Critical care was time spent personally by me on the following activities:  Evaluation of patient's response to treatment, examination of patient, ordering and performing treatments and interventions, ordering and review of laboratory studies, ordering and review of radiographic studies, pulse oximetry, re-evaluation of patient's condition, obtaining history from patient or surrogate, review of old charts and development of treatment plan with patient or surrogate     Medications Ordered in ED Medications  0.9 %  sodium chloride infusion (0 mL/hr Intravenous Stopped 10/27/20 2217)    ED Course  I have reviewed the triage vital signs and the nursing notes.  Pertinent labs & imaging results that were available during my care of the patient were reviewed by me and considered in my medical decision making (see chart for details).  Clinical Course as of 10/28/20 0846  Fri Oct 27, 2020  1615 LFTs have been mildly elevated in the past.  Hemoglobin low at 5.8.  Will need transfusion. [MB]  1922 Reassessed patient as she has already had 1 unit and second unit is infusing now.  She denies any shortness of breath and is hoping to be discharged after transfusion. [MB]  2129 She has received 2 units of packed red blood cells.  She is lying down in bed breathing comfortably denies any shortness of breath.  She is waiting for discharge paperwork. [MB]    Clinical Course User Index [MB] Hayden Rasmussen, MD   MDM Rules/Calculators/A&P                         This patient complains of chest pain, concern for anemia; this involves an extensive number of treatment Options and is a complaint that carries with it  a high risk of complications and Morbidity. The differential includes anemia, ACS, metabolic derangement, CHF  I ordered, reviewed and interpreted labs, which included CBC with normal white count, hemoglobin critically low at 5.8, chemistries with elevated creatinine consistent with her end-stage renal disease, elevated LFTs similar to prior, troponins elevated although not rising, COVID testing negative I ordered medication transfusion of 2 units of packed red blood cells for her acute anemia Previous records obtained and reviewed in epic including recent admission for anemia and NSTEMI  Critical Interventions: Transfusion of 2 units of packed red blood cells for her acute symptomatic anemia.  No evidence of NSTEMI.  After the interventions stated above, I reevaluated the patient and found patient's symptoms to be improved.  No evidence of fluid overload.  She is comfortable plan for discharge and follow-up with dialysis tomorrow.  Return instructions discussed   Final Clinical Impression(s) / ED Diagnoses Final diagnoses:  Symptomatic anemia  Nonspecific  chest pain    Rx / DC Orders ED Discharge Orders    None       Hayden Rasmussen, MD 10/28/20 [phone]

## 2020-10-27 NOTE — ED Triage Notes (Signed)
Emergency Medicine Provider Triage Evaluation Note  Patricia Mata , a 65 y.o. female  was evaluated in triage.  Pt complains of sent here from the cancer center with a hemoglobin of 5.6.  Review of Systems  Positive: Weakness, shortness of breath Negative: Chest pain, syncope, vaginal bleeding, hematochezia  Physical Exam  BP (!) 173/44 (BP Location: Left Arm)   Pulse 84   Temp 99.3 F (37.4 C) (Oral)   Resp 16   Ht 5\' 3"  (1.6 m)   Wt 91.4 kg   SpO2 97%   BMI 35.71 kg/m  Gen:   Awake, no distress   HEENT:  Atraumatic  Resp:  Normal effort  Cardiac:  Normal rate  Abd:   Nondistended, nontender  MSK:   Moves extremities without difficulty  Neuro:  Speech clear   Medical Decision Making  Medically screening exam initiated at 2:00 PM.  Appropriate orders placed.  [name] was informed that the remainder of the evaluation will be completed by another provider, this initial triage assessment does not replace that evaluation, and the importance of remaining in the ED until their evaluation is complete.  Clinical Impression     Garald Balding, PA-C 10/27/20 1401

## 2020-10-28 LAB — TYPE AND SCREEN
ABO/RH(D): A NEG
Antibody Screen: NEGATIVE
Unit division: 0
Unit division: 0

## 2020-10-28 LAB — BPAM RBC
Blood Product Expiration Date: 202205092359
Blood Product Expiration Date: 202205102359
ISSUE DATE / TIME: 202204151541
ISSUE DATE / TIME: 202204151818
Unit Type and Rh: 600
Unit Type and Rh: 600

## 2020-10-30 ENCOUNTER — Inpatient Hospital Stay: Payer: Medicare Other

## 2020-10-30 ENCOUNTER — Telehealth: Payer: Self-pay | Admitting: Oncology

## 2020-10-30 ENCOUNTER — Inpatient Hospital Stay: Payer: Medicare Other | Admitting: Oncology

## 2020-10-30 ENCOUNTER — Other Ambulatory Visit: Payer: Self-pay

## 2020-10-30 DIAGNOSIS — N183 Chronic kidney disease, stage 3 unspecified: Secondary | ICD-10-CM

## 2020-10-30 NOTE — Telephone Encounter (Signed)
Cancelled appt sper 4/18 sch msg. Pt was already scheduled for this Friday, pt has chosen to just keep Friday's appts and not r/s missed appts from today.

## 2020-11-01 ENCOUNTER — Other Ambulatory Visit: Payer: Medicare Other

## 2020-11-01 ENCOUNTER — Telehealth: Payer: Self-pay

## 2020-11-01 ENCOUNTER — Encounter (HOSPITAL_COMMUNITY): Payer: Medicare Other

## 2020-11-01 ENCOUNTER — Ambulatory Visit: Payer: Medicare Other | Admitting: Oncology

## 2020-11-01 ENCOUNTER — Ambulatory Visit: Payer: Medicare Other

## 2020-11-01 NOTE — Telephone Encounter (Signed)
Error

## 2020-11-03 ENCOUNTER — Inpatient Hospital Stay: Payer: Medicare Other

## 2020-11-03 ENCOUNTER — Other Ambulatory Visit: Payer: Self-pay | Admitting: *Deleted

## 2020-11-03 ENCOUNTER — Other Ambulatory Visit: Payer: Self-pay

## 2020-11-03 ENCOUNTER — Inpatient Hospital Stay (HOSPITAL_BASED_OUTPATIENT_CLINIC_OR_DEPARTMENT_OTHER): Payer: Medicare Other | Admitting: Oncology

## 2020-11-03 VITALS — BP 146/41 | HR 62 | Temp 98.6°F | Resp 18

## 2020-11-03 VITALS — BP 122/49 | HR 63 | Resp 18 | Wt 203.1 lb

## 2020-11-03 DIAGNOSIS — D469 Myelodysplastic syndrome, unspecified: Secondary | ICD-10-CM | POA: Diagnosis not present

## 2020-11-03 DIAGNOSIS — D649 Anemia, unspecified: Secondary | ICD-10-CM

## 2020-11-03 LAB — CBC WITH DIFFERENTIAL (CANCER CENTER ONLY)
Abs Immature Granulocytes: 0.04 10*3/uL (ref 0.00–0.07)
Basophils Absolute: 0 10*3/uL (ref 0.0–0.1)
Basophils Relative: 0 %
Eosinophils Absolute: 0 10*3/uL (ref 0.0–0.5)
Eosinophils Relative: 1 %
HCT: 22.3 % — ABNORMAL LOW (ref 36.0–46.0)
Hemoglobin: 7.3 g/dL — ABNORMAL LOW (ref 12.0–15.0)
Immature Granulocytes: 1 %
Lymphocytes Relative: 11 %
Lymphs Abs: 0.4 10*3/uL — ABNORMAL LOW (ref 0.7–4.0)
MCH: 30 pg (ref 26.0–34.0)
MCHC: 32.7 g/dL (ref 30.0–36.0)
MCV: 91.8 fL (ref 80.0–100.0)
Monocytes Absolute: 0.6 10*3/uL (ref 0.1–1.0)
Monocytes Relative: 17 %
Neutro Abs: 2.4 10*3/uL (ref 1.7–7.7)
Neutrophils Relative %: 70 %
Platelet Count: 180 10*3/uL (ref 150–400)
RBC: 2.43 MIL/uL — ABNORMAL LOW (ref 3.87–5.11)
RDW: 19.8 % — ABNORMAL HIGH (ref 11.5–15.5)
WBC Count: 3.5 10*3/uL — ABNORMAL LOW (ref 4.0–10.5)
nRBC: 2.3 % — ABNORMAL HIGH (ref 0.0–0.2)

## 2020-11-03 LAB — SAMPLE TO BLOOD BANK

## 2020-11-03 LAB — PREPARE RBC (CROSSMATCH)

## 2020-11-03 MED ORDER — DIPHENHYDRAMINE HCL 25 MG PO CAPS
25.0000 mg | ORAL_CAPSULE | Freq: Once | ORAL | Status: AC
  Administered 2020-11-03: 25 mg via ORAL

## 2020-11-03 MED ORDER — FUROSEMIDE 10 MG/ML IJ SOLN
INTRAMUSCULAR | Status: AC
  Filled 2020-11-03: qty 2

## 2020-11-03 MED ORDER — SODIUM CHLORIDE 0.9% IV SOLUTION
250.0000 mL | Freq: Once | INTRAVENOUS | Status: AC
  Administered 2020-11-03: 250 mL via INTRAVENOUS
  Filled 2020-11-03: qty 250

## 2020-11-03 MED ORDER — LUSPATERCEPT-AAMT 75 MG ~~LOC~~ SOLR
1.7500 mg/kg | Freq: Once | SUBCUTANEOUS | Status: AC
Start: 2020-11-03 — End: 2020-11-03
  Administered 2020-11-03: 150 mg via SUBCUTANEOUS
  Filled 2020-11-03: qty 3

## 2020-11-03 MED ORDER — TRAZODONE HCL 50 MG PO TABS: 25.0000 mg | ORAL_TABLET | Freq: Every day | ORAL | 1 refills | Status: DC

## 2020-11-03 MED ORDER — FUROSEMIDE 10 MG/ML IJ SOLN
20.0000 mg | Freq: Once | INTRAMUSCULAR | Status: AC
  Administered 2020-11-03: 20 mg via INTRAVENOUS

## 2020-11-03 MED ORDER — DIPHENHYDRAMINE HCL 25 MG PO CAPS
ORAL_CAPSULE | ORAL | Status: AC
  Filled 2020-11-03: qty 1

## 2020-11-03 MED ORDER — ACETAMINOPHEN 325 MG PO TABS
ORAL_TABLET | ORAL | Status: AC
  Filled 2020-11-03: qty 2

## 2020-11-03 MED ORDER — ACETAMINOPHEN 325 MG PO TABS
650.0000 mg | ORAL_TABLET | Freq: Once | ORAL | Status: AC
  Administered 2020-11-03: 650 mg via ORAL

## 2020-11-03 NOTE — Addendum Note (Signed)
Addended by: [name] on: 11/03/2020 09:54 AM   Modules accepted: Orders

## 2020-11-03 NOTE — Progress Notes (Signed)
Hematology and Oncology Follow Up Visit  Patricia Mata [id number] May 22, 1956 65 y.o. 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  Secondary diagnosis: Stage III colon cancer diagnosed in 2008.  No additional treatment needed and continues to be disease-free.    Prior Therapy: She is S/P hemicolectomy done in 11/2006. 1/13 lymph nodes are positive.  This was followed by 12 cycles of FOLFOX completed in 05/2007  Aranesp 300 mcg every 2 to 3 weeks therapy was ineffective to eliminate transfusion needs.  5-azacytidine at 75 mg/m square subcutaneously started in October 2018.  She is status post 7 cycles of therapy completed in April 2019.   Current therapy:   Reblozyl 75 mg subcutaneous injection every 3 weeks started in December 2021.    She is receiving supportive transfusions as needed.    Interim History:  Patricia Mata presents today for return evaluation.  Since the last visit, he had multiple hospitalizations most recently on on March 28 after she suffered non-STEMI with troponin over 7000.  She underwent cardiac cath with diffuse coronary disease and currently on medical management.  In the interim she developed a worsening anemia with a hemoglobin down to 5.8 on April 15 and required a repeat transfusion.  Clinically, she reports feeling well without any major complaints.  She denies any chest pain shortness of breath or difficulty breathing.  She denies hematochezia or melena.    Medication: Reviewed without changes. Current Outpatient Medications on File Prior to Visit  Medication Sig Dispense Refill  . allopurinol (ZYLOPRIM) 100 MG tablet Take 100 mg by mouth daily.    Marland Kitchen amiodarone (PACERONE) 200 MG tablet Take 200 mg by mouth daily.    Marland Kitchen amLODipine (NORVASC) 10 MG tablet Take 1 tablet (10 mg total) by mouth daily. 90 tablet 3  . atorvastatin (LIPITOR) 40 MG tablet TAKE 1 TABLET BY  MOUTH EVERY DAY 90 tablet 0  . blood glucose meter kit and supplies Dispense based on patient and insurance preference. Use up to four times daily as directed. (FOR ICD-10 E10.9, E11.9). 1 each 3  . colchicine 0.6 MG tablet Take 0.6 mg by mouth 2 (two) times daily as needed (for gout).    Marland Kitchen doxazosin (CARDURA) 1 MG tablet Take 1 tablet (1 mg total) by mouth at bedtime. 30 tablet 6  . doxycycline (ADOXA) 100 MG tablet Take 1 tablet (100 mg total) by mouth 2 (two) times daily for 7 days. 14 tablet 0  . furosemide (LASIX) 40 MG tablet TAKE 1 TABLET BY MOUTH EVERY DAY 90 tablet 1  . gabapentin (NEURONTIN) 300 MG capsule     . glucose blood test strip Use as instructed 100 each 12  . hydrALAZINE (APRESOLINE) 100 MG tablet Take 1 tablet (100 mg total) by mouth 3 (three) times daily. 180 tablet 5  . insulin glargine (LANTUS) 100 unit/mL SOPN Inject 0.15 mLs (15 Units total) into the skin daily. (Patient taking differently: Inject 15 Units into the skin daily as needed (blood sugar over 150). )    . Insulin Pen Needle 29G X 5MM MISC Use as directed 200 each 0  . latanoprost (XALATAN) 0.005 % ophthalmic solution Place 1 drop into both eyes at bedtime.     Marland Kitchen levothyroxine (SYNTHROID) 88 MCG tablet Take 1 tablet (88 mcg total) by mouth daily. 30 tablet 2  . metoprolol succinate (TOPROL-XL) 25 MG 24 hr tablet Take  1 tablet (25 mg total) by mouth daily. STOP taking this unless heart rate >100bpm OR directed by your doctor 30 tablet 5  . Multiple Vitamins-Minerals (CENTRUM SILVER 50+WOMEN) TABS Take 1 tablet by mouth daily.    . sildenafil (REVATIO) 20 MG tablet Take 1 tablet (20 mg total) by mouth 3 (three) times daily. 270 tablet 3  . sitaGLIPtin (JANUVIA) 50 MG tablet Take 1 tablet (50 mg total) by mouth daily. 90 tablet 0               Allergies:  Allergies  Allergen Reactions  . Ancef [Cefazolin] Itching    Severe itching- after procedure, ancef was the antibiotic.-04/02/17 Tolerates  penicillins  . Lactose Intolerance (Gi) Diarrhea  . Sulfa Antibiotics Rash and Other (See Comments)    Blisters, also     Physical Exam:      Blood pressure (!) 122/49, pulse 63, resp. rate 18, weight 203 lb 1 oz (92.1 kg), SpO2 100 %.     ECOG: 2     General appearance: Comfortable appearing without any discomfort Head: Normocephalic without any trauma Oropharynx: Mucous membranes are moist and pink without any thrush or ulcers. Eyes: Pupils are equal and round reactive to light. Lymph nodes: No cervical, supraclavicular, inguinal or axillary lymphadenopathy.   Heart:regular rate and rhythm.  S1 and S2 without leg edema. Lung: Clear without any rhonchi or wheezes.  No dullness to percussion. Abdomin: Soft, nontender, nondistended with good bowel sounds.  No hepatosplenomegaly. Musculoskeletal: No joint deformity or effusion.  Full range of motion noted. Neurological: No deficits noted on motor, sensory and deep tendon reflex exam. Skin: No petechial rash or dryness.  Appeared moist.  Psychiatric: Mood and affect appeared appropriate.                   Lab Results: Lab Results  Component Value Date   WBC 6.0 10/27/2020   HGB 5.8 (LL) 10/27/2020   HCT 17.6 (L) 10/27/2020   MCV 96.7 10/27/2020   PLT 181 10/27/2020     Chemistry      Component Value Date/Time   NA 134 (L) 10/27/2020 1448   NA 139 09/05/2020 1253   NA 134 (L) 05/26/2017 0949   K 3.8 10/27/2020 1448   K 4.0 05/26/2017 0949   CL 95 (L) 10/27/2020 1448   CL 96 (L) 03/31/2012 1513   CO2 28 10/27/2020 1448   CO2 24 05/26/2017 0949   BUN 37 (H) 10/27/2020 1448   BUN 89 (HH) 09/05/2020 1253   BUN 29.2 (H) 05/26/2017 0949   CREATININE 2.08 (H) 10/27/2020 1448   CREATININE 4.79 (HH) 09/28/2020 1351   CREATININE 2.24 (H) 01/27/2019 1355   CREATININE 1.4 (H) 05/26/2017 0949      Component Value Date/Time   CALCIUM 9.0 10/27/2020 1448   CALCIUM 8.9 05/26/2017 0949   ALKPHOS 131 (H)  10/27/2020 1448   ALKPHOS 157 (H) 05/26/2017 0949   AST 98 (H) 10/27/2020 1448   AST 104 (H) 09/28/2020 1351   AST 29 05/26/2017 0949   ALT 60 (H) 10/27/2020 1448   ALT 82 (H) 09/28/2020 1351   ALT 23 05/26/2017 0949   BILITOT 0.8 10/27/2020 1448   BILITOT 0.9 09/28/2020 1351   BILITOT 2.19 (H) 05/26/2017 0949      Impression and Plan:  65 year old woman with:   1.  MDS diagnosed with severe anemia in 2017.  She had continues to have progressive disease.   Her disease  status was updated at this time and treatment options were reviewed.  She is not a candidate for any additional aggressive therapy at this time given her other comorbidities.  We will continue supportive care and resume Reblozyl every 3 weeks.  She is agreeable to continue.   2.  Anemia: Related to MDS and chronic renal insufficiency.  We will continue to receive supportive care and transfusion as needed.  Laboratory data reviewed today showed a hemoglobin of 7.3 with normal white cell count and platelet count.  I recommend proceeding with 2 units of packed red cells given her cardiac history.  3. Hypertension: Her blood pressure is under control.   4.  Cardiac considerations: She has aortic stenosis as well as coronary disease.  She is currently on medical management.   5.  Prognosis: Overall poor given her multiple comorbid conditions and cardiac status as well as her transfusion dependence.  Performance status is adequate and aggressive measures are warranted.  6. Follow-up: We will continue to follow every 3 weeks for injection and supportive care.  30  minutes were dedicated to this encounter.  The time was spent on reviewing laboratory data, disease status and treatment options for the future.   Zola Button, MD 4/22/20228:43 AM

## 2020-11-03 NOTE — Patient Instructions (Signed)
Blood Transfusion, Adult, Care After This sheet gives you information about how to care for yourself after your procedure. Your doctor may also give you more specific instructions. If you have problems or questions, contact your doctor. What can I expect after the procedure? After the procedure, it is common to have:  Bruising and soreness at the IV site.  A fever or chills on the day of the procedure. This may be your body's response to the new blood cells received.  A headache. Follow these instructions at home: Insertion site care  Follow instructions from your doctor about how to take care of your insertion site. This is where an IV tube was put into your vein. Make sure you: ? Wash your hands with soap and water before and after you change your bandage (dressing). If you cannot use soap and water, use hand sanitizer. ? Change your bandage as told by your doctor.  Check your insertion site every day for signs of infection. Check for: ? Redness, swelling, or pain. ? Bleeding from the site. ? Warmth. ? Pus or a bad smell.      General instructions  Take over-the-counter and prescription medicines only as told by your doctor.  Rest as told by your doctor.  Go back to your normal activities as told by your doctor.  Keep all follow-up visits as told by your doctor. This is important. Contact a doctor if:  You have itching or red, swollen areas of skin (hives).  You feel worried or nervous (anxious).  You feel weak after doing your normal activities.  You have redness, swelling, warmth, or pain around the insertion site.  You have blood coming from the insertion site, and the blood does not stop with pressure.  You have pus or a bad smell coming from the insertion site. Get help right away if:  You have signs of a serious reaction. This may be coming from an allergy or the body's defense system (immune system). Signs include: ? Trouble breathing or shortness of  breath. ? Swelling of the face or feeling warm (flushed). ? Fever or chills. ? Head, chest, or back pain. ? Dark pee (urine) or blood in the pee. ? Widespread rash. ? Fast heartbeat. ? Feeling dizzy or light-headed. You may receive your blood transfusion in an outpatient setting. If so, you will be told whom to contact to report any reactions. These symptoms may be an emergency. Do not wait to see if the symptoms will go away. Get medical help right away. Call your local emergency services (911 in the [place] Do not drive yourself to the hospital. Summary  Bruising and soreness at the IV site are common.  Check your insertion site every day for signs of infection.  Rest as told by your doctor. Go back to your normal activities as told by your doctor.  Get help right away if you have signs of a serious reaction. This information is not intended to replace advice given to you by your health care provider. Make sure you discuss any questions you have with your health care provider. Document Revised: 12/24/2018 Document Reviewed: 12/24/2018 Elsevier Patient Education  2021 [place] injection What is this medicine? LUSPATERCEPT (lus PAT er sept) helps your body make more red blood cells. This medicine is used to treat anemia caused by beta thalassemia or myelodysplastic syndromes. This medicine may be used for other purposes; ask your health care provider or pharmacist if you have questions. COMMON BRAND  NAME(S): REBLOZYL What should I tell my health care provider before I take this medicine? They need to know if you have any of these conditions:  cigarette smoker  have had your spleen removed  high blood pressure  history of blood clots  an unusual or allergic reaction to luspatercept, other medicines, foods, dyes or preservatives  pregnant or trying to get pregnant  breast-feeding How should I use this medicine? This medicine is for injection under the  skin. It is given by a healthcare professional in a hospital or clinic setting. Talk to your pediatrician about the use of the medicine in children. This medicine is not approved for use in children. Overdosage: If you think you have taken too much of this medicine contact a poison control center or emergency room at once. NOTE: This medicine is only for you. Do not share this medicine with others. What if I miss a dose? Keep appointments for follow-up doses. It is important not to miss your dose. Call your doctor or healthcare professional if you are unable to keep an appointment. What may interact with this medicine? Interactions are not expected. This list may not describe all possible interactions. Give your health care provider a list of all the medicines, herbs, non-prescription drugs, or dietary supplements you use. Also tell them if you smoke, drink alcohol, or use illegal drugs. Some items may interact with your medicine. What should I watch for while using this medicine? Your condition will be monitored carefully while you are receiving this medicine. Do not become pregnant while taking this medicine or for 3 months after stopping it. Women should inform their healthcare professional if they wish to become pregnant or think they might be pregnant. There is a potential for serious side effects and harm to an unborn child. Talk to your healthcare professional for more information. Do not breast-feed an infant while taking this medicine. You may need blood work done while you are taking this medicine. What side effects may I notice from receiving this medicine? Side effects that you should report to your doctor or health care professional as soon as possible:  allergic reactions like skin rash, itching or hives; swelling of the face, lips, or tongue  signs and symptoms of a blood clot such as chest pain; shortness of breath; pain, swelling, or warmth in the leg  signs and symptoms of a  stroke like changes in vision; confusion; trouble speaking or understanding; severe headaches; sudden numbness or weakness of the face, arm or leg; trouble walking; dizziness; loss of balance or coordination Side effects that usually do not require medical attention (report these to your doctor or health care professional if they continue or are bothersome):  cough  diarrhea  dizziness  headache  joint pain  stomach pain  tiredness This list may not describe all possible side effects. Call your doctor for medical advice about side effects. You may report side effects to FDA at 1-800-FDA-1088. Where should I keep my medicine? This medicine is given in a hospital or clinic and will not be stored at home. NOTE: This sheet is a summary. It may not cover all possible information. If you have questions about this medicine, talk to your doctor, pharmacist, or health care provider.  2021 Elsevier/Gold Standard (2018-10-20 15:57:59)

## 2020-11-04 LAB — TYPE AND SCREEN
ABO/RH(D): A NEG
Antibody Screen: NEGATIVE
Unit division: 0

## 2020-11-04 LAB — BPAM RBC
Blood Product Expiration Date: 202205112359
ISSUE DATE / TIME: 202204221115
Unit Type and Rh: 600

## 2020-11-07 ENCOUNTER — Other Ambulatory Visit: Payer: Self-pay | Admitting: Rheumatology

## 2020-11-07 DIAGNOSIS — R7989 Other specified abnormal findings of blood chemistry: Secondary | ICD-10-CM

## 2020-11-08 ENCOUNTER — Ambulatory Visit (INDEPENDENT_AMBULATORY_CARE_PROVIDER_SITE_OTHER): Payer: Medicare Other | Admitting: Otolaryngology

## 2020-11-08 ENCOUNTER — Encounter (INDEPENDENT_AMBULATORY_CARE_PROVIDER_SITE_OTHER): Payer: Self-pay | Admitting: Otolaryngology

## 2020-11-08 ENCOUNTER — Other Ambulatory Visit: Payer: Self-pay

## 2020-11-08 VITALS — Temp 97.2°F

## 2020-11-08 DIAGNOSIS — R040 Epistaxis: Secondary | ICD-10-CM

## 2020-11-08 NOTE — Progress Notes (Signed)
HPI: Patricia Mata is a 65 y.o. female who presents is referred by Dr. Sloan Leiter for evaluation of recurrent epistaxis.  Over the past few months patient has had recurrent nosebleeds.  Sometimes nosebleeds can be bad with a flow over to the opposite side.  She generally has bleeding more from the left side.  The last few times has bled is been less of blood coming out.  But in the past apparently she became anemic and felt like this might of been secondary to the bad nosebleeds.  She is referred here for evaluation of recurrent left-sided epistaxis.Marland Kitchen  Past Medical History:  Diagnosis Date  . Acute renal failure (ARF) ([place]) 06/08/2018  . Carotid artery disease ([place])   . Chronic diastolic (congestive) heart failure ([place])   . Colon cancer ([place])   . Diabetes mellitus ([place])   . Gout 09/08/2018  . Heart attack ([place])   . Hypertension   . Hypothyroidism   . Macrocytic anemia   . MDS (myelodysplastic syndrome) ([place])   . Moderate mitral stenosis   . PAD (peripheral artery disease) ([place])   . PAF (paroxysmal atrial fibrillation) ([place])   . Pulmonary hypertension ([place])   . Renal artery stenosis ([facility])    a. >60% R renal artery stenosis by duplex 2021, managed medically.  . Severe aortic stenosis   . Stage 4 chronic kidney disease ([place])   . Transfusion-dependent anemia    Past Surgical History:  Procedure Laterality Date  . AV FISTULA PLACEMENT Right 09/26/2020   Procedure: RIGHT ARM ARTERIOVENOUS (AV) FISTULA CREATION;  Surgeon: Waynetta Sandy, MD;  Location: [facility] OR;  Service: Vascular;  Laterality: Right;  Monitor Anesthesia Care with Regional Block to Right Arm   . COLON SURGERY    . DEBRIDMENT OF DECUBITUS ULCER N/A 06/12/2018   Procedure: DEBRIDMENT OF DECUBITUS ULCER;  Surgeon: Wallace Going, DO;  Location: [facility] ORS;  Service: Plastics;  Laterality: N/A;  . ESOPHAGOGASTRODUODENOSCOPY N/A 09/01/2020   Procedure: ESOPHAGOGASTRODUODENOSCOPY (EGD);  Surgeon: Clarene Essex, MD;   Location: [facility];  Service: Endoscopy;  Laterality: N/A;  . GIVENS CAPSULE STUDY N/A 09/01/2020   Procedure: GIVENS CAPSULE STUDY;  Surgeon: Clarene Essex, MD;  Location: [facility];  Service: Endoscopy;  Laterality: N/A;  . IR FLUORO GUIDE CV LINE RIGHT  10/11/2020  . IR FLUORO GUIDE PORT INSERTION RIGHT  04/02/2017  . IR REMOVAL TUN ACCESS W/ PORT W/O FL MOD SED  03/16/2019  . IR US GUIDE VASC ACCESS RIGHT  04/02/2017  . IR US GUIDE VASC ACCESS RIGHT  10/11/2020  . RIGHT HEART CATH N/A 04/23/2019   Procedure: RIGHT HEART CATH;  Surgeon: Jolaine Artist, MD;  Location: [facility];  Service: Cardiovascular;  Laterality: N/A;  . RIGHT/LEFT HEART CATH AND CORONARY ANGIOGRAPHY N/A 05/21/2018   Procedure: RIGHT/LEFT HEART CATH AND CORONARY ANGIOGRAPHY;  Surgeon: Nelva Bush, MD;  Location: [place] [facility];  Service: Cardiovascular;  Laterality: N/A;  . RIGHT/LEFT HEART CATH AND CORONARY ANGIOGRAPHY N/A 10/13/2020   Procedure: RIGHT/LEFT HEART CATH AND CORONARY ANGIOGRAPHY;  Surgeon: Jolaine Artist, MD;  Location: [facility];  Service: Cardiovascular;  Laterality: N/A;  . TUBAL LIGATION     Social History   Socioeconomic History  . Marital status: Married    Spouse name: Not on file  . Number of children: Not on file  . Years of education: Not on file  . Highest education level: Not on file  Occupational History  .  Occupation: retired  Tobacco Use  . Smoking status: Former Smoker    Packs/day: 0.25    Years: 20.00    Pack years: 5.00    Types: Cigarettes    Quit date: 1997    Years since quitting: 25.3  . Smokeless tobacco: Former Network engineer  . Vaping Use: Never used  Substance and Sexual Activity  . Alcohol use: Yes    Alcohol/week: 2.0 standard drinks    Types: 2 Shots of liquor per week    Comment: occ  . Drug use: Never  . Sexual activity: Not on file  Other Topics Concern  . Not on file  Social History Narrative  . Not on file    Social Determinants of Health   Financial Resource Strain: Medium Risk  . Difficulty of Paying Living Expenses: Somewhat hard  Food Insecurity: Not on file  Transportation Needs: Not on file  Physical Activity: Not on file  Stress: Not on file  Social Connections: Not on file   Family History  Problem Relation Age of Onset  . Hypertension Mother   . Diabetes Mother   . Cervical cancer Mother   . Heart attack Father   . Hypertension Sister   . Hypertension Brother   . Hypertension Sister   . Hypertension Sister   . Prostate cancer Brother   . HIV/AIDS Brother    Allergies  Allergen Reactions  . Ancef [Cefazolin] Itching    Severe itching- after procedure, ancef was the antibiotic.-04/02/17 Tolerates penicillins  . Lactose Intolerance (Gi) Diarrhea  . Sulfa Antibiotics Rash and Other (See Comments)    Blisters, also   Prior to Admission medications   Medication Sig Start Date End Date Taking? Authorizing Provider  allopurinol (ZYLOPRIM) 300 MG tablet Take 300 mg by mouth daily. 07/05/20   [provider]  amiodarone (PACERONE) 200 MG tablet Take 1 tablet (200 mg total) by mouth daily. 07/19/20   Bensimhon, Shaune Pascal, MD  atorvastatin (LIPITOR) 40 MG tablet Take 1 tablet by mouth once daily Patient taking differently: Take 40 mg by mouth daily. 08/09/20   [name], FNP  blood glucose meter kit and supplies Dispense based on patient and insurance preference. Use up to four times daily as directed. (FOR ICD-10 E10.9, E11.9). 11/04/18   [name], FNP  Blood Glucose Monitoring Suppl [facility]) w/Device KIT Check blood sugars twice daily E11.9 11/30/19   [name], FNP  cloNIDine (CATAPRES) 0.1 MG tablet Take 1 tablet (0.1 mg total) by mouth 2 (two) times daily. 07/03/20   Bensimhon, Shaune Pascal, MD  colchicine 0.6 MG tablet Take 0.6 mg by mouth daily as needed (gout).    [provider]  fluticasone (FLONASE) 50 MCG/ACT nasal spray Place 1 spray  into both nostrils daily. 01/28/20   Collene Gobble, MD  furosemide (LASIX) 40 MG tablet Take 1 tablet (40 mg total) by mouth daily. Take 80 mg in AM and 40 mg in PM (4PM) Patient not taking: No sig reported 07/09/20   Debbe Odea, MD  glucose blood test strip check blood sugars twice daily Dx code E11.22 03/02/20   [name], FNP  hydrALAZINE (APRESOLINE) 100 MG tablet Take 1 tablet (100 mg total) by mouth 3 (three) times daily. 09/27/20   Lorretta Harp, MD  Insulin Glargine [facility]) 100 UNIT/ML Inject 20 Units into the skin daily. Patient taking differently: Inject 15 Units into the skin daily. 09/05/20   [name], FNP  Insulin  Pen Needle 29G X 5MM MISC Use as directed 06/16/17   Bonnielee Haff, MD  Lancets (ONETOUCH DELICA PLUS [id number]) MISC check blood sugars twice daily Dx code: E11.22 03/02/20   [name], FNP  latanoprost (XALATAN) 0.005 % ophthalmic solution Place 1 drop into both eyes at bedtime.     [provider]  levothyroxine (SYNTHROID) 150 MCG tablet Take 1 tablet (150 mcg total) by mouth daily before breakfast. 09/07/20   [name], FNP  linagliptin (TRADJENTA) 5 MG TABS tablet Take 1 tablet (5 mg total) by mouth daily. 02/03/20   [name], FNP  loratadine (CLARITIN) 10 MG tablet Take 10 mg by mouth daily.    [provider]  Multiple Vitamins-Minerals (CENTRUM SILVER 50+WOMEN) TABS Take 1 tablet by mouth daily.    [provider]  omeprazole (PRILOSEC) 40 MG capsule Take 1 capsule (40 mg total) by mouth 2 (two) times daily before a meal. 09/27/20 09/27/21  [name], FNP  polyethylene glycol (MIRALAX / GLYCOLAX) 17 g packet Take 17 g by mouth daily. 10/16/20   Barb Merino, MD  senna-docusate (SENOKOT-S) 8.6-50 MG tablet Take 1 tablet by mouth 2 (two) times daily. 10/16/20 11/15/20  Barb Merino, MD  sildenafil (REVATIO) 20 MG tablet Take 1 tablet (20 mg total) by mouth 3 (three) times daily. 09/06/20   Bensimhon, Shaune Pascal, MD  sodium chloride (OCEAN) 0.65 % nasal spray Place 1 spray into the nose in the morning, at noon, in the evening, and at bedtime.    [provider]  traZODone (DESYREL) 50 MG tablet Take 0.5 tablets (25 mg total) by mouth at bedtime. 11/03/20   [name], MD     Positive ROS: Otherwise negative  All other systems have been reviewed and were otherwise negative with the exception of those mentioned in the HPI and as above.  Physical Exam: Constitutional: Alert, well-appearing, no acute distress Ears: External ears without lesions or tenderness. Ear canals are clear bilaterally with intact, clear TMs.  Nasal: External nose without lesions. Septum is slightly deviated to the right..  On nasal endoscopy the posterior nasal cavity nasopharynx was clear.  There was no active bleeding noted in the office today however she did have a small raised lesion on the left side of the septum at the approximate level of the left inferior turbinate.  This was a little more posterior than normal septal bleeding site.  This was cauterized using silver nitrate.  Remaining nasal cavity was clear. Oral: Lips and gums without lesions. Tongue and palate mucosa without lesions. Posterior oropharynx clear. Neck: No palpable adenopathy or masses Respiratory: Breathing comfortably  Skin: No facial/neck lesions or rash noted.  Control of epistaxis  Date/Time: 11/08/2020 1:17 PM Performed by: Rozetta Nunnery, MD Authorized by: Rozetta Nunnery, MD   Consent:    Consent obtained:  Verbal   Consent given by:  Patient   Risks discussed:  Bleeding and pain   Alternatives discussed:  No treatment and observation Procedure details:    Treatment site:  L anterior   Treatment method:  Silver nitrate   Treatment complexity:  Limited   Treatment episode: initial   Post-procedure details:    Patient tolerance of procedure:  Tolerated well, no immediate complications Comments:     On anterior  rhinoscopy patient had a prominent nodular or papillomas lesion on the septum that was midway back on the septum about the level of the anterior portion of the left inferior turbinate.  This was cauterized using silver nitrate.    Assessment: Left-sided epistaxis.  Most likely from left septal vessel that was cauterized using silver nitrate. Nasal cavity and nasopharynx was otherwise clear on nasal endoscopy.  Plan: Reviewed with her concerning use of cotton balls and Afrin to stop bleeding if she has any bleeding in the future.  She will contact us if she has any further epistaxis.   Radene Journey, MD   CC:

## 2020-11-09 ENCOUNTER — Encounter (HOSPITAL_COMMUNITY): Payer: Self-pay | Admitting: *Deleted

## 2020-11-09 NOTE — Progress Notes (Signed)
Pt's disability claim form completed, signed by Dr Haroldine Laws, and faxed to [place] at [phone]  Pt aware this has been done and copy mailed to her home per her request

## 2020-11-10 ENCOUNTER — Other Ambulatory Visit: Payer: Self-pay | Admitting: Nurse Practitioner

## 2020-11-10 DIAGNOSIS — E7800 Pure hypercholesterolemia, unspecified: Secondary | ICD-10-CM

## 2020-11-15 ENCOUNTER — Other Ambulatory Visit: Payer: Self-pay | Admitting: Nurse Practitioner

## 2020-11-20 ENCOUNTER — Ambulatory Visit (INDEPENDENT_AMBULATORY_CARE_PROVIDER_SITE_OTHER): Payer: Medicare Other | Admitting: Cardiovascular Disease

## 2020-11-20 ENCOUNTER — Other Ambulatory Visit: Payer: Self-pay

## 2020-11-20 ENCOUNTER — Encounter: Payer: Self-pay | Admitting: Cardiovascular Disease

## 2020-11-20 VITALS — BP 158/52 | HR 92 | Ht 64.0 in | Wt 201.0 lb

## 2020-11-20 DIAGNOSIS — I25119 Atherosclerotic heart disease of native coronary artery with unspecified angina pectoris: Secondary | ICD-10-CM

## 2020-11-20 DIAGNOSIS — I350 Nonrheumatic aortic (valve) stenosis: Secondary | ICD-10-CM

## 2020-11-20 DIAGNOSIS — I480 Paroxysmal atrial fibrillation: Secondary | ICD-10-CM

## 2020-11-20 NOTE — Progress Notes (Signed)
Cardiology Office Note:    Date:  11/22/2020   ID:  Patricia Mata, DOB 1956/06/08, MRN [id number]  PCP:  [name], [place] Providers Cardiologist:  Quay Burow, MD Advanced Heart Failure:  Glori Bickers, MD     Referring MD: [name], FNP   Chief Complaint  Patient presents with  . Aortic Stenosis    History of Present Illness:    Patricia Mata is a 65 y.o. female with multiple medical problems, presenting for follow-up of aortic stenosis.  The patient has type 2 diabetes, longstanding hypertension, end-stage renal disease recently initiated on hemodialysis, colon cancer status post hemicolectomy and chemotherapy with resultant myelodysplastic syndrome and transfusion dependent anemia.  She has been hospitalized multiple times with severe symptomatic anemia, acute kidney injury, and acute heart failure.  In February of this year she was admitted with a hemoglobin of 4.1 and severe chest pain with elevated troponin.  She ultimately underwent cardiac catheterization demonstrating moderately severe left main disease with 60% stenosis in the mid shaft of the left mainstem.  Hemodynamics are consistent with moderate to severe aortic stenosis.  CT angiography studies demonstrated near occlusive aortic calcification and plaque which would be prohibitive for transfemoral access for TAVR.  Ongoing conservative management was recommended.  She presents today for follow-up evaluation.  The patient is here alone today. She is tolerating outpatient hemodialysis on a Tuesday/Thursday/Saturday schedule and feels well even on dialysis days. No hypotension reported. She takes her BP meds as soon as she gets home dialysis days but holds them prior to dialysis. Denies recent chest pain or pressure. No shortness of breath with her normal activities.  Overall she feels she is doing well.  Past Medical History:  Diagnosis Date  . Acute renal failure (ARF) ([place])  06/08/2018  . Carotid artery disease ([place])   . Chronic diastolic (congestive) heart failure ([place])   . Colon cancer ([place])   . Diabetes mellitus ([place])   . Gout 09/08/2018  . Heart attack ([place])   . Hypertension   . Hypothyroidism   . Macrocytic anemia   . MDS (myelodysplastic syndrome) ([place])   . Moderate mitral stenosis   . PAD (peripheral artery disease) ([place])   . PAF (paroxysmal atrial fibrillation) ([place])   . Pulmonary hypertension ([place])   . Renal artery stenosis ([facility])    a. >60% R renal artery stenosis by duplex 2021, managed medically.  . Severe aortic stenosis   . Stage 4 chronic kidney disease ([place])   . Transfusion-dependent anemia     Past Surgical History:  Procedure Laterality Date  . AV FISTULA PLACEMENT Right 09/26/2020   Procedure: RIGHT ARM ARTERIOVENOUS (AV) FISTULA CREATION;  Surgeon: Waynetta Sandy, MD;  Location: [facility];  Service: Vascular;  Laterality: Right;  Monitor Anesthesia Care with Regional Block to Right Arm   . COLON SURGERY    . DEBRIDMENT OF DECUBITUS ULCER N/A 06/12/2018   Procedure: DEBRIDMENT OF DECUBITUS ULCER;  Surgeon: Wallace Going, DO;  Location: [facility] ORS;  Service: Plastics;  Laterality: N/A;  . ESOPHAGOGASTRODUODENOSCOPY N/A 09/01/2020   Procedure: ESOPHAGOGASTRODUODENOSCOPY (EGD);  Surgeon: Clarene Essex, MD;  Location: [facility];  Service: Endoscopy;  Laterality: N/A;  . GIVENS CAPSULE STUDY N/A 09/01/2020   Procedure: GIVENS CAPSULE STUDY;  Surgeon: Clarene Essex, MD;  Location: [facility] ENDOSCOPY;  Service: Endoscopy;  Laterality: N/A;  . IR FLUORO GUIDE CV LINE RIGHT  10/11/2020  . IR FLUORO GUIDE PORT INSERTION RIGHT  04/02/2017  .  IR REMOVAL TUN ACCESS W/ PORT W/O FL MOD SED  03/16/2019  . IR US GUIDE VASC ACCESS RIGHT  04/02/2017  . IR US GUIDE VASC ACCESS RIGHT  10/11/2020  . RIGHT HEART CATH N/A 04/23/2019   Procedure: RIGHT HEART CATH;  Surgeon: Jolaine Artist, MD;  Location: [place] [facility] LAB;  Service: Cardiovascular;   Laterality: N/A;  . RIGHT/LEFT HEART CATH AND CORONARY ANGIOGRAPHY N/A 05/21/2018   Procedure: RIGHT/LEFT HEART CATH AND CORONARY ANGIOGRAPHY;  Surgeon: Nelva Bush, MD;  Location: [facility];  Service: Cardiovascular;  Laterality: N/A;  . RIGHT/LEFT HEART CATH AND CORONARY ANGIOGRAPHY N/A 10/13/2020   Procedure: RIGHT/LEFT HEART CATH AND CORONARY ANGIOGRAPHY;  Surgeon: Jolaine Artist, MD;  Location: [place] [facility] LAB;  Service: Cardiovascular;  Laterality: N/A;  . TUBAL LIGATION      Current Medications: Current Meds  Medication Sig  . allopurinol (ZYLOPRIM) 300 MG tablet Take 300 mg by mouth daily.  Marland Kitchen amiodarone (PACERONE) 200 MG tablet Take 1 tablet (200 mg total) by mouth daily.  Marland Kitchen atorvastatin (LIPITOR) 40 MG tablet Take 1 tablet by mouth once daily  . blood glucose meter kit and supplies Dispense based on patient and insurance preference. Use up to four times daily as directed. (FOR ICD-10 E10.9, E11.9). (Patient taking differently: 1 each by Other route See admin instructions. Dispense based on patient and insurance preference. Use up to four times daily as directed. (FOR ICD-10 E10.9, E11.9).)  . Blood Glucose Monitoring Suppl (ONETOUCH ULTRALINK) w/Device KIT Check blood sugars twice daily E11.9 (Patient taking differently: 1 each by Other route 2 (two) times daily. Check blood sugars twice daily E11.9)  . carvedilol (COREG) 6.25 MG tablet Take 6.25 mg by mouth 2 (two) times daily.  . cloNIDine (CATAPRES) 0.1 MG tablet Take 1 tablet (0.1 mg total) by mouth 2 (two) times daily.  . colchicine 0.6 MG tablet Take 0.6 mg by mouth daily as needed (gout).  . fluticasone (FLONASE) 50 MCG/ACT nasal spray Place 1 spray into both nostrils daily.  . furosemide (LASIX) 40 MG tablet Take 1 tablet (40 mg total) by mouth daily. Take 80 mg in AM and 40 mg in PM (4PM) (Patient not taking: Reported on 11/21/2020)  . glucose blood test strip check blood sugars twice daily Dx code E11.22  (Patient taking differently: 1 each by Other route See admin instructions. check blood sugars twice daily Dx code E11.22)  . hydrALAZINE (APRESOLINE) 100 MG tablet Take 1 tablet (100 mg total) by mouth 3 (three) times daily.  . Insulin Glargine (BASAGLAR KWIKPEN) 100 UNIT/ML Inject 15 Units into the skin daily. Only if above 150  . Insulin Pen Needle 29G X 5MM MISC Use as directed (Patient taking differently: 1 each by Other route as directed.)  . Lancets (ONETOUCH DELICA PLUS [id number]) MISC check blood sugars twice daily Dx code: E11.22 (Patient taking differently: 1 each by Other route See admin instructions. check blood sugars twice daily Dx code: E11.22)  . latanoprost (XALATAN) 0.005 % ophthalmic solution Place 1 drop into both eyes at bedtime.   Marland Kitchen levothyroxine (SYNTHROID) 150 MCG tablet Take 1 tablet (150 mcg total) by mouth daily before breakfast.  . linagliptin (TRADJENTA) 5 MG TABS tablet Take 1 tablet (5 mg total) by mouth daily.  Marland Kitchen loratadine (CLARITIN) 10 MG tablet Take 10 mg by mouth daily.  . Methoxy PEG-Epoetin Beta (MIRCERA IJ) Mircera  . Multiple Vitamins-Minerals (CENTRUM SILVER 50+WOMEN) TABS Take 1 tablet by mouth  daily.  . omeprazole (PRILOSEC) 40 MG capsule TAKE 1 CAPSULE BY MOUTH TWICE DAILY BEFORE A MEAL (Patient taking differently: Take 40 mg by mouth daily.)  . polyethylene glycol (MIRALAX / GLYCOLAX) 17 g packet Take 17 g by mouth daily.  . sildenafil (REVATIO) 20 MG tablet Take 1 tablet (20 mg total) by mouth 3 (three) times daily.  . sodium chloride (OCEAN) 0.65 % nasal spray Place 1 spray into the nose in the morning, at noon, in the evening, and at bedtime.  . traZODone (DESYREL) 50 MG tablet Take 0.5 tablets (25 mg total) by mouth at bedtime. (Patient not taking: Reported on 11/21/2020)     Allergies:   Ancef [cefazolin], Lactose intolerance (gi), and Sulfa antibiotics   Social History   Socioeconomic History  . Marital status: Married    Spouse name: Not on  file  . Number of children: Not on file  . Years of education: Not on file  . Highest education level: Not on file  Occupational History  . Occupation: retired  Tobacco Use  . Smoking status: Former Smoker    Packs/day: 0.25    Years: 20.00    Pack years: 5.00    Types: Cigarettes    Quit date: 1997    Years since quitting: 25.3  . Smokeless tobacco: Former Network engineer  . Vaping Use: Never used  Substance and Sexual Activity  . Alcohol use: Yes    Alcohol/week: 2.0 standard drinks    Types: 2 Shots of liquor per week    Comment: occ  . Drug use: Never  . Sexual activity: Not on file  Other Topics Concern  . Not on file  Social History Narrative  . Not on file   Social Determinants of Health   Financial Resource Strain: Medium Risk  . Difficulty of Paying Living Expenses: Somewhat hard  Food Insecurity: Not on file  Transportation Needs: Not on file  Physical Activity: Not on file  Stress: Not on file  Social Connections: Not on file     Family History: The patient's family history includes Cervical cancer in her mother; Diabetes in her mother; HIV/AIDS in her brother; Heart attack in her father; Hypertension in her brother, mother, sister, sister, and sister; Prostate cancer in her brother.  ROS:   Please see the history of present illness.    All other systems reviewed and are negative.  EKGs/Labs/Other Studies Reviewed:    The following studies were reviewed today: Echo 10/09/2020: 1. Left ventricular ejection fraction, by estimation, is 50 to 55%. The  left ventricle has low normal function. The left ventricle has no regional  wall motion abnormalities. The left ventricular internal cavity size was  mildly dilated. Left ventricular  diastolic parameters are indeterminate.  2. Right ventricular systolic function is mildly reduced. The right  ventricular size is normal. There is severely elevated pulmonary artery  systolic pressure. The estimated right  ventricular systolic pressure is  95.6 mmHg.  3. Left atrial size was mildly dilated.  4. Right atrial size was mildly dilated.  5. The mitral valve is degenerative. Trivial mitral valve regurgitation.  Moderate mitral stenosis. The mean mitral valve gradient is 7.0 mmHg with  MVA 1.5 cm^2 by VTI. Moderate to severe mitral annular calcification.  6. The aortic valve is tricuspid. Aortic valve regurgitation is mild.  Severe aortic valve stenosis. Aortic valve area, by VTI measures 0.71 cm.  Aortic valve mean gradient measures 65.0 mmHg.  7. The inferior  vena cava is dilated in size with >50% respiratory  variability, suggesting right atrial pressure of 8 mmHg.  8. The patient was in atrial fibrillation.   Cath 10/13/2020: Conclusion    Mid RCA lesion is 20% stenosed.  Mid LM lesion is 60% stenosed.  Ost Cx to Prox Cx lesion is 30% stenosed.  Mid LAD lesion is 30% stenosed.   Findings:  Ao = 152/38 (76) LV =  186/25 RA =  6 RV = 76/11 PA = 74/19 (43) PCW = unable to obtain Fick cardiac output/index = 9.1/4.6 PVR = 2.0 (using LVEDP) Ao sat = 96% PA sat = 67%. 67%  AoV: Crossed easily with JR4 and J-wire. Peak-to-peak gradient 64mHg  Mean gradient 33mG AVA 1.4cm2 Assessment:  1. Significant CAD with 60% mid LM disease. O/w non-obstructive CAD 2. EF 60% 3. Moderate to severe pulmonary HTN due to high output 4. Moderate to severe AS  Plan/Discussion:  Results d/w TAVR team. Given unfavorable anatomy, burden of comorbidites and need for alternative access, I suspect risks of TAVR outweigh possible benefits. Favor medical management.   EKG:  EKG is not ordered today.   Recent Labs: 09/02/2020: Magnesium 1.9 09/05/2020: TSH 3.550 11/21/2020: ALT 35; B Natriuretic Peptide 1,054.4 11/22/2020: BUN 67; Creatinine, Ser 3.18; Hemoglobin 7.6; Platelets 190; Potassium 4.7; Sodium 133  Recent Lipid Panel    Component Value Date/Time   CHOL 176 11/21/2020 1302    CHOL 168 08/10/2020 1601   TRIG 85 11/21/2020 1302   HDL 88 11/21/2020 1302   HDL 58 08/10/2020 1601   CHOLHDL 2.0 11/21/2020 1302   VLDL 17 11/21/2020 1302   LDLCALC 71 11/21/2020 1302   LDLCALC 92 08/10/2020 1601     Risk Assessment/Calculations:    CHA2DS2-VASc Score = 8  This indicates a 10.8% annual risk of stroke. The patient's score is based upon: CHF History: Yes HTN History: Yes Diabetes History: Yes Stroke History: Yes Vascular Disease History: Yes Age Score: 1 Gender Score: 1      Physical Exam:    VS:  BP (!) 158/52   Pulse 92   Ht '5\' 4"'  (1.626 m)   Wt 201 lb (91.2 kg)   SpO2 97%   BMI 34.50 kg/m     Wt Readings from Last 3 Encounters:  11/22/20 202 lb 13.2 oz (92 kg)  11/20/20 201 lb (91.2 kg)  11/03/20 203 lb 1 oz (92.1 kg)     GEN:  Well nourished, well developed in no acute distress HEENT: Normal NECK: No JVD; No carotid bruits LYMPHATICS: No lymphadenopathy CARDIAC: RRR, 3/6 harsh crescendo decrescendo murmur at the RUSB RESPIRATORY:  Clear to auscultation without rales, wheezing or rhonchi  ABDOMEN: Soft, non-tender, non-distended MUSCULOSKELETAL:  No edema; No deformity  SKIN: Warm and dry NEUROLOGIC:  Alert and oriented x 3 PSYCHIATRIC:  Normal affect   ASSESSMENT:    1. Severe aortic stenosis   2. Coronary artery disease involving native coronary artery of native heart with angina pectoris ([place]  3. PAF (paroxysmal atrial fibrillation) ([facility])    PLAN:    In order of problems listed above:  1. Recommend ongoing surveillance.  I am not sure that she will be a candidate for TAVR in the future due to her multiple severe comorbidities.  It is a difficult situation because she otherwise is functionally independent.  However, the patient has transfusion dependent myelodysplastic syndrome and very frequent hospitalizations with symptomatic anemia.  While her aortic stenosis contributes to her  symptoms when she becomes anemic, I am not sure  that the risk of the procedure would outweigh the potential benefit, especially considering the fact that she would require alternative access that she would not be a candidate for minimally invasive transfemoral approach.  I will plan to see her back in 6 months. 2. Appears stable.  Not a candidate for antiplatelet therapy.  Treated with atorvastatin and carvedilol. 3. Maintaining sinus rhythm on amiodarone.  Not a candidate for oral anticoagulation  Overall the patient appears clinically stable, but continues to struggle with anemia intermittently.  Tough situation to say the least.  Medication Adjustments/Labs and Tests Ordered: Current medicines are reviewed at length with the patient today.  Concerns regarding medicines are outlined above.  No orders of the defined types were placed in this encounter.  No orders of the defined types were placed in this encounter.   Patient Instructions  Medication Instructions:  Your provider recommends that you continue on your current medications as directed. Please refer to the Current Medication list given to you today.   *If you need a refill on your cardiac medications before your next appointment, please call your pharmacy*   Follow-Up: You will be called to arrange your 6 month echo and office visit.    Signed, Sherren Mocha, MD  11/22/2020 5:38 PM    [place]

## 2020-11-20 NOTE — Patient Instructions (Signed)
Medication Instructions:  Your provider recommends that you continue on your current medications as directed. Please refer to the Current Medication list given to you today.   *If you need a refill on your cardiac medications before your next appointment, please call your pharmacy*    Follow-Up: You will be called to arrange your 6 month echo and office visit! 

## 2020-11-21 ENCOUNTER — Encounter (HOSPITAL_COMMUNITY): Payer: Medicare Other

## 2020-11-21 ENCOUNTER — Inpatient Hospital Stay (HOSPITAL_COMMUNITY)
Admission: EM | Admit: 2020-11-21 | Discharge: 2020-11-23 | DRG: 640 | Disposition: A | Discharge status: Home / Self Care | Payer: Medicare Other | Attending: Internal Medicine | Admitting: Internal Medicine

## 2020-11-21 ENCOUNTER — Emergency Department (HOSPITAL_COMMUNITY): Payer: Medicare Other

## 2020-11-21 ENCOUNTER — Other Ambulatory Visit: Payer: Self-pay

## 2020-11-21 ENCOUNTER — Encounter (HOSPITAL_COMMUNITY): Payer: Self-pay | Admitting: Emergency Medicine

## 2020-11-21 DIAGNOSIS — Z79899 Other long term (current) drug therapy: Secondary | ICD-10-CM

## 2020-11-21 DIAGNOSIS — Z881 Allergy status to other antibiotic agents status: Secondary | ICD-10-CM

## 2020-11-21 DIAGNOSIS — I132 Hypertensive heart and chronic kidney disease with heart failure and with stage 5 chronic kidney disease, or end stage renal disease: Secondary | ICD-10-CM | POA: Diagnosis present

## 2020-11-21 DIAGNOSIS — Z87891 Personal history of nicotine dependence: Secondary | ICD-10-CM

## 2020-11-21 DIAGNOSIS — J9601 Acute respiratory failure with hypoxia: Secondary | ICD-10-CM | POA: Diagnosis present

## 2020-11-21 DIAGNOSIS — E1151 Type 2 diabetes mellitus with diabetic peripheral angiopathy without gangrene: Secondary | ICD-10-CM | POA: Diagnosis present

## 2020-11-21 DIAGNOSIS — Z9851 Tubal ligation status: Secondary | ICD-10-CM

## 2020-11-21 DIAGNOSIS — I480 Paroxysmal atrial fibrillation: Secondary | ICD-10-CM | POA: Diagnosis present

## 2020-11-21 DIAGNOSIS — D649 Anemia, unspecified: Secondary | ICD-10-CM

## 2020-11-21 DIAGNOSIS — M109 Gout, unspecified: Secondary | ICD-10-CM | POA: Diagnosis present

## 2020-11-21 DIAGNOSIS — E039 Hypothyroidism, unspecified: Secondary | ICD-10-CM | POA: Diagnosis present

## 2020-11-21 DIAGNOSIS — R079 Chest pain, unspecified: Secondary | ICD-10-CM | POA: Diagnosis present

## 2020-11-21 DIAGNOSIS — I779 Disorder of arteries and arterioles, unspecified: Secondary | ICD-10-CM | POA: Diagnosis present

## 2020-11-21 DIAGNOSIS — Z794 Long term (current) use of insulin: Secondary | ICD-10-CM

## 2020-11-21 DIAGNOSIS — E8770 Fluid overload, unspecified: Secondary | ICD-10-CM | POA: Diagnosis not present

## 2020-11-21 DIAGNOSIS — Z833 Family history of diabetes mellitus: Secondary | ICD-10-CM

## 2020-11-21 DIAGNOSIS — K219 Gastro-esophageal reflux disease without esophagitis: Secondary | ICD-10-CM | POA: Diagnosis present

## 2020-11-21 DIAGNOSIS — Z79890 Hormone replacement therapy (postmenopausal): Secondary | ICD-10-CM

## 2020-11-21 DIAGNOSIS — D631 Anemia in chronic kidney disease: Secondary | ICD-10-CM | POA: Diagnosis present

## 2020-11-21 DIAGNOSIS — Z85038 Personal history of other malignant neoplasm of large intestine: Secondary | ICD-10-CM

## 2020-11-21 DIAGNOSIS — E119 Type 2 diabetes mellitus without complications: Secondary | ICD-10-CM

## 2020-11-21 DIAGNOSIS — Z8701 Personal history of pneumonia (recurrent): Secondary | ICD-10-CM

## 2020-11-21 DIAGNOSIS — Z9115 Patient's noncompliance with renal dialysis: Secondary | ICD-10-CM

## 2020-11-21 DIAGNOSIS — I272 Pulmonary hypertension, unspecified: Secondary | ICD-10-CM | POA: Diagnosis present

## 2020-11-21 DIAGNOSIS — D469 Myelodysplastic syndrome, unspecified: Secondary | ICD-10-CM | POA: Diagnosis present

## 2020-11-21 DIAGNOSIS — R0902 Hypoxemia: Secondary | ICD-10-CM | POA: Diagnosis present

## 2020-11-21 DIAGNOSIS — I252 Old myocardial infarction: Secondary | ICD-10-CM

## 2020-11-21 DIAGNOSIS — E1122 Type 2 diabetes mellitus with diabetic chronic kidney disease: Secondary | ICD-10-CM | POA: Diagnosis present

## 2020-11-21 DIAGNOSIS — Z8249 Family history of ischemic heart disease and other diseases of the circulatory system: Secondary | ICD-10-CM

## 2020-11-21 DIAGNOSIS — I350 Nonrheumatic aortic (valve) stenosis: Secondary | ICD-10-CM

## 2020-11-21 DIAGNOSIS — E785 Hyperlipidemia, unspecified: Secondary | ICD-10-CM | POA: Diagnosis present

## 2020-11-21 DIAGNOSIS — Z992 Dependence on renal dialysis: Secondary | ICD-10-CM

## 2020-11-21 DIAGNOSIS — I2510 Atherosclerotic heart disease of native coronary artery without angina pectoris: Secondary | ICD-10-CM | POA: Diagnosis present

## 2020-11-21 DIAGNOSIS — N186 End stage renal disease: Secondary | ICD-10-CM | POA: Diagnosis present

## 2020-11-21 DIAGNOSIS — I5032 Chronic diastolic (congestive) heart failure: Secondary | ICD-10-CM | POA: Diagnosis present

## 2020-11-21 DIAGNOSIS — R072 Precordial pain: Secondary | ICD-10-CM

## 2020-11-21 DIAGNOSIS — R69 Illness, unspecified: Secondary | ICD-10-CM

## 2020-11-21 DIAGNOSIS — E739 Lactose intolerance, unspecified: Secondary | ICD-10-CM | POA: Diagnosis present

## 2020-11-21 DIAGNOSIS — Z882 Allergy status to sulfonamides status: Secondary | ICD-10-CM

## 2020-11-21 DIAGNOSIS — I080 Rheumatic disorders of both mitral and aortic valves: Secondary | ICD-10-CM | POA: Diagnosis present

## 2020-11-21 DIAGNOSIS — Z20822 Contact with and (suspected) exposure to covid-19: Secondary | ICD-10-CM | POA: Diagnosis present

## 2020-11-21 LAB — RESP PANEL BY RT-PCR (FLU A&B, COVID) ARPGX2
Influenza A by PCR: NEGATIVE
Influenza B by PCR: NEGATIVE
SARS Coronavirus 2 by RT PCR: NEGATIVE

## 2020-11-21 LAB — CBC WITH DIFFERENTIAL/PLATELET
Abs Immature Granulocytes: 0.04 10*3/uL (ref 0.00–0.07)
Basophils Absolute: 0 10*3/uL (ref 0.0–0.1)
Basophils Relative: 0 %
Eosinophils Absolute: 0 10*3/uL (ref 0.0–0.5)
Eosinophils Relative: 0 %
HCT: 20.7 % — ABNORMAL LOW (ref 36.0–46.0)
Hemoglobin: 7.3 g/dL — ABNORMAL LOW (ref 12.0–15.0)
Immature Granulocytes: 1 %
Lymphocytes Relative: 21 %
Lymphs Abs: 1.1 10*3/uL (ref 0.7–4.0)
MCH: 31.6 pg (ref 26.0–34.0)
MCHC: 35.3 g/dL (ref 30.0–36.0)
MCV: 89.6 fL (ref 80.0–100.0)
Monocytes Absolute: 0.9 10*3/uL (ref 0.1–1.0)
Monocytes Relative: 17 %
Neutro Abs: 3 10*3/uL (ref 1.7–7.7)
Neutrophils Relative %: 61 %
Platelets: 223 10*3/uL (ref 150–400)
RBC: 2.31 MIL/uL — ABNORMAL LOW (ref 3.87–5.11)
RDW: 21.1 % — ABNORMAL HIGH (ref 11.5–15.5)
WBC: 5 10*3/uL (ref 4.0–10.5)
nRBC: 1.8 % — ABNORMAL HIGH (ref 0.0–0.2)

## 2020-11-21 LAB — LIPID PANEL
Cholesterol: 176 mg/dL (ref 0–200)
HDL: 88 mg/dL (ref >40)
LDL Cholesterol: 71 mg/dL (ref 0–99)
Total CHOL/HDL Ratio: 2 RATIO
Triglycerides: 85 mg/dL (ref <150)
VLDL: 17 mg/dL (ref 0–40)

## 2020-11-21 LAB — COMPREHENSIVE METABOLIC PANEL
ALT: 35 U/L (ref 0–44)
AST: 59 U/L — ABNORMAL HIGH (ref 15–41)
Albumin: 3.8 g/dL (ref 3.5–5.0)
Alkaline Phosphatase: 160 U/L — ABNORMAL HIGH (ref 38–126)
Anion gap: 15 (ref 5–15)
BUN: 54 mg/dL — ABNORMAL HIGH (ref 8–23)
CO2: 22 mmol/L (ref 22–32)
Calcium: 9.6 mg/dL (ref 8.9–10.3)
Chloride: 95 mmol/L — ABNORMAL LOW (ref 98–111)
Creatinine, Ser: 2.59 mg/dL — ABNORMAL HIGH (ref 0.44–1.00)
GFR, Estimated: 20 mL/min — ABNORMAL LOW (ref >60)
Glucose, Bld: 161 mg/dL — ABNORMAL HIGH (ref 70–99)
Potassium: 4.4 mmol/L (ref 3.5–5.1)
Sodium: 132 mmol/L — ABNORMAL LOW (ref 135–145)
Total Bilirubin: 1.1 mg/dL (ref 0.3–1.2)
Total Protein: 7.3 g/dL (ref 6.5–8.1)

## 2020-11-21 LAB — I-STAT CHEM 8, ED
BUN: 56 mg/dL — ABNORMAL HIGH (ref 8–23)
Calcium, Ion: 1.13 mmol/L — ABNORMAL LOW (ref 1.15–1.40)
Chloride: 96 mmol/L — ABNORMAL LOW (ref 98–111)
Creatinine, Ser: 2.7 mg/dL — ABNORMAL HIGH (ref 0.44–1.00)
Glucose, Bld: 160 mg/dL — ABNORMAL HIGH (ref 70–99)
HCT: 22 % — ABNORMAL LOW (ref 36.0–46.0)
Hemoglobin: 7.5 g/dL — ABNORMAL LOW (ref 12.0–15.0)
Potassium: 4.5 mmol/L (ref 3.5–5.1)
Sodium: 130 mmol/L — ABNORMAL LOW (ref 135–145)
TCO2: 22 mmol/L (ref 22–32)

## 2020-11-21 LAB — CREATININE, SERUM
Creatinine, Ser: 2.53 mg/dL — ABNORMAL HIGH (ref 0.44–1.00)
GFR, Estimated: 20 mL/min — ABNORMAL LOW (ref >60)

## 2020-11-21 LAB — PROTIME-INR
INR: 1 (ref 0.8–1.2)
Prothrombin Time: 13.3 seconds (ref 11.4–15.2)

## 2020-11-21 LAB — BRAIN NATRIURETIC PEPTIDE: B Natriuretic Peptide: 1054.4 pg/mL — ABNORMAL HIGH (ref 0.0–100.0)

## 2020-11-21 LAB — LIPASE, BLOOD: Lipase: 32 U/L (ref 11–51)

## 2020-11-21 LAB — TROPONIN I (HIGH SENSITIVITY)
Troponin I (High Sensitivity): 23 ng/L — ABNORMAL HIGH (ref <18)
Troponin I (High Sensitivity): 31 ng/L — ABNORMAL HIGH (ref <18)
Troponin I (High Sensitivity): 79 ng/L — ABNORMAL HIGH (ref <18)
Troponin I (High Sensitivity): 115 ng/L (ref <18)

## 2020-11-21 LAB — LACTIC ACID, PLASMA
Lactic Acid, Venous: 2.8 mmol/L (ref 0.5–1.9)
Lactic Acid, Venous: 1.9 mmol/L (ref 0.5–1.9)

## 2020-11-21 LAB — PREPARE RBC (CROSSMATCH)

## 2020-11-21 MED ORDER — HYDROMORPHONE HCL 1 MG/ML IJ SOLN
1.0000 mg | Freq: Once | INTRAMUSCULAR | Status: DC
  Filled 2020-11-21: qty 1

## 2020-11-21 MED ORDER — ONDANSETRON 4 MG PO TBDP
4.0000 mg | ORAL_TABLET | Freq: Once | ORAL | Status: AC
  Administered 2020-11-21: 4 mg via ORAL
  Filled 2020-11-21: qty 1

## 2020-11-21 MED ORDER — FLUTICASONE PROPIONATE 50 MCG/ACT NA SUSP
1.0000 | Freq: Every day | NASAL | Status: DC
  Administered 2020-11-22: 1 via NASAL
  Filled 2020-11-21: qty 16

## 2020-11-21 MED ORDER — HEPARIN SODIUM (PORCINE) 5000 UNIT/ML IJ SOLN
5000.0000 [IU] | Freq: Three times a day (TID) | INTRAMUSCULAR | Status: DC
  Administered 2020-11-21 – 2020-11-23 (×5): 5000 [IU] via SUBCUTANEOUS
  Filled 2020-11-21 (×5): qty 1

## 2020-11-21 MED ORDER — LORATADINE 10 MG PO TABS
10.0000 mg | ORAL_TABLET | Freq: Every day | ORAL | Status: DC
  Administered 2020-11-22: 10 mg via ORAL
  Filled 2020-11-21: qty 1

## 2020-11-21 MED ORDER — AMIODARONE HCL 200 MG PO TABS
200.0000 mg | ORAL_TABLET | Freq: Every day | ORAL | Status: DC
  Administered 2020-11-22: 200 mg via ORAL
  Filled 2020-11-21: qty 1

## 2020-11-21 MED ORDER — PANTOPRAZOLE SODIUM 40 MG PO TBEC
40.0000 mg | DELAYED_RELEASE_TABLET | Freq: Two times a day (BID) | ORAL | Status: DC
  Administered 2020-11-21 – 2020-11-22 (×3): 40 mg via ORAL
  Filled 2020-11-21 (×3): qty 1

## 2020-11-21 MED ORDER — CARVEDILOL 6.25 MG PO TABS
6.2500 mg | ORAL_TABLET | Freq: Two times a day (BID) | ORAL | Status: DC
  Administered 2020-11-21 – 2020-11-22 (×2): 6.25 mg via ORAL
  Filled 2020-11-21 (×3): qty 2
  Filled 2020-11-21: qty 1

## 2020-11-21 MED ORDER — DIPHENHYDRAMINE HCL 50 MG/ML IJ SOLN
50.0000 mg | Freq: Once | INTRAMUSCULAR | Status: AC
  Administered 2020-11-21: 50 mg via INTRAVENOUS
  Filled 2020-11-21: qty 1

## 2020-11-21 MED ORDER — ALUM & MAG HYDROXIDE-SIMETH 200-200-20 MG/5ML PO SUSP
30.0000 mL | Freq: Once | ORAL | Status: AC
  Administered 2020-11-21: 30 mL via ORAL
  Filled 2020-11-21: qty 30

## 2020-11-21 MED ORDER — SALINE SPRAY 0.65 % NA SOLN: 1.0000 | NASAL | Status: DC | PRN

## 2020-11-21 MED ORDER — HYDROMORPHONE HCL 1 MG/ML IJ SOLN
1.0000 mg | Freq: Once | INTRAMUSCULAR | Status: AC
  Administered 2020-11-21: 1 mg via INTRAVENOUS
  Filled 2020-11-21: qty 1

## 2020-11-21 MED ORDER — LIDOCAINE VISCOUS HCL 2 % MT SOLN
15.0000 mL | Freq: Once | OROMUCOSAL | Status: AC
  Administered 2020-11-21: 15 mL via ORAL
  Filled 2020-11-21: qty 15

## 2020-11-21 MED ORDER — ALLOPURINOL 300 MG PO TABS
300.0000 mg | ORAL_TABLET | Freq: Every day | ORAL | Status: DC
  Administered 2020-11-22: 300 mg via ORAL
  Filled 2020-11-21: qty 1

## 2020-11-21 MED ORDER — LINAGLIPTIN 5 MG PO TABS
5.0000 mg | ORAL_TABLET | Freq: Every day | ORAL | Status: DC
  Administered 2020-11-22: 5 mg via ORAL
  Filled 2020-11-21: qty 1

## 2020-11-21 MED ORDER — ALPRAZOLAM 0.25 MG PO TABS: 0.2500 mg | ORAL_TABLET | Freq: Two times a day (BID) | ORAL | Status: DC | PRN

## 2020-11-21 MED ORDER — CLONIDINE HCL 0.1 MG PO TABS
0.1000 mg | ORAL_TABLET | Freq: Two times a day (BID) | ORAL | Status: DC
  Administered 2020-11-21 – 2020-11-22 (×2): 0.1 mg via ORAL
  Filled 2020-11-21 (×4): qty 1

## 2020-11-21 MED ORDER — SODIUM CHLORIDE 0.9 % IV SOLN
10.0000 mL/h | Freq: Once | INTRAVENOUS | Status: AC
  Administered 2020-11-21: 10 mL/h via INTRAVENOUS

## 2020-11-21 MED ORDER — ONDANSETRON HCL 4 MG/2ML IJ SOLN
4.0000 mg | Freq: Four times a day (QID) | INTRAMUSCULAR | Status: DC | PRN
  Administered 2020-11-21: 4 mg via INTRAVENOUS
  Filled 2020-11-21: qty 2

## 2020-11-21 MED ORDER — LATANOPROST 0.005 % OP SOLN
1.0000 [drp] | Freq: Every day | OPHTHALMIC | Status: DC
  Administered 2020-11-21: 1 [drp] via OPHTHALMIC
  Filled 2020-11-21: qty 2.5

## 2020-11-21 MED ORDER — ACETAMINOPHEN 325 MG PO TABS: 650.0000 mg | ORAL_TABLET | ORAL | Status: DC | PRN

## 2020-11-21 MED ORDER — HYDROMORPHONE HCL 1 MG/ML IJ SOLN
1.0000 mg | Freq: Once | INTRAMUSCULAR | Status: AC
  Administered 2020-11-21: 1 mg via INTRAVENOUS

## 2020-11-21 MED ORDER — ATORVASTATIN CALCIUM 40 MG PO TABS
40.0000 mg | ORAL_TABLET | Freq: Every day | ORAL | Status: DC
  Administered 2020-11-22: 40 mg via ORAL
  Filled 2020-11-21: qty 1

## 2020-11-21 MED ORDER — POLYETHYLENE GLYCOL 3350 17 G PO PACK
17.0000 g | PACK | Freq: Every day | ORAL | Status: DC
  Administered 2020-11-21 – 2020-11-22 (×2): 17 g via ORAL
  Filled 2020-11-21 (×2): qty 1

## 2020-11-21 MED ORDER — SILDENAFIL CITRATE 20 MG PO TABS
20.0000 mg | ORAL_TABLET | Freq: Three times a day (TID) | ORAL | Status: DC
  Administered 2020-11-21 – 2020-11-22 (×4): 20 mg via ORAL
  Filled 2020-11-21 (×5): qty 1

## 2020-11-21 MED ORDER — ADULT MULTIVITAMIN W/MINERALS CH
1.0000 | ORAL_TABLET | Freq: Every day | ORAL | Status: DC
  Administered 2020-11-22: 1 via ORAL
  Filled 2020-11-21: qty 1

## 2020-11-21 MED ORDER — DIPHENHYDRAMINE HCL 50 MG/ML IJ SOLN
12.5000 mg | Freq: Three times a day (TID) | INTRAMUSCULAR | Status: DC | PRN
  Administered 2020-11-21: 12.5 mg via INTRAVENOUS
  Filled 2020-11-21: qty 1

## 2020-11-21 MED ORDER — PANTOPRAZOLE SODIUM 40 MG IV SOLR
40.0000 mg | Freq: Once | INTRAVENOUS | Status: AC
  Administered 2020-11-21: 40 mg via INTRAVENOUS
  Filled 2020-11-21: qty 40

## 2020-11-21 MED ORDER — ZOLPIDEM TARTRATE 5 MG PO TABS
5.0000 mg | ORAL_TABLET | Freq: Every evening | ORAL | Status: DC | PRN
  Administered 2020-11-21: 5 mg via ORAL
  Filled 2020-11-21: qty 1

## 2020-11-21 MED ORDER — HYDRALAZINE HCL 50 MG PO TABS
100.0000 mg | ORAL_TABLET | Freq: Three times a day (TID) | ORAL | Status: DC
  Administered 2020-11-21 – 2020-11-22 (×3): 100 mg via ORAL
  Filled 2020-11-21 (×5): qty 2

## 2020-11-21 MED ORDER — LEVOTHYROXINE SODIUM 75 MCG PO TABS
150.0000 ug | ORAL_TABLET | Freq: Every day | ORAL | Status: DC
  Administered 2020-11-22 – 2020-11-23 (×2): 150 ug via ORAL
  Filled 2020-11-21: qty 2
  Filled 2020-11-21: qty 1

## 2020-11-21 NOTE — ED Notes (Signed)
Results of the Chem 8 shown and signed by D. Pfeiffer RN acknowledged results as well

## 2020-11-21 NOTE — ED Triage Notes (Signed)
Pt reports that she was on her way to dialysis when chest and left arm started hurting so came to ED. Pt reports that last time this happened she had low hgb of 4 and had a heart attack.

## 2020-11-21 NOTE — ED Notes (Signed)
Walked to the blood bank per request of Casey Burkitt. Arrived back within 5 minutes and handed it to [facility] stating that she would give it to [facility]

## 2020-11-21 NOTE — H&P (Signed)
History and Physical    Patricia Mata XUX:[id number] DOB: 01/12/1956 DOA: 11/21/2020  PCP: [name], FNP   Patient coming from: home  I have personally briefly reviewed patient's old medical records in [place]  Chief Complaint: chest pain  HPI: Patricia Mata is a 65 y.o. female with medical history significant of ESRD, HTN, HLD, anemia of CKD, a. Fib, type 2 diabetes with nephropathy, GERD, hypothyroidism, pulmonary HTN, non-obstructive CAD, chronic diastolic HF, aortic stenosis and gout, who presented to ED with complaints of CP. Mid center, radiated to the left, 7-8/10 in intensity, no associated or alleviating factors reported. Patient expressed recent admission due to chest pain that triggered need for heart cath (at that time medication management was decided).  Patient expressed easy fatigability and feeling tired easily; also expressed generalized weakness. Denies fever, nausea, vomiting, focal weakness, melena, hematochezia or any other complaints.  ED Course: flat elevation of troponin X 2 appreciated, CXR w/o acute cardiopulmonary process. EKG w/o acute ischemic changes and overall stable electrolytes. Hgb 7.3.   At time of my evaluation, while resting and receiving PRBC transfusion, patient expressed no CP.  Review of Systems: As per HPI otherwise all other systems reviewed and are negative.   Past Medical History:  Diagnosis Date  . Acute renal failure (ARF) ([place]) 06/08/2018  . Carotid artery disease ([place])   . Chronic diastolic (congestive) heart failure ([place])   . Colon cancer ([place])   . Diabetes mellitus ([place])   . Gout 09/08/2018  . Heart attack ([place])   . Hypertension   . Hypothyroidism   . Macrocytic anemia   . MDS (myelodysplastic syndrome) ([place])   . Moderate mitral stenosis   . PAD (peripheral artery disease) ([place])   . PAF (paroxysmal atrial fibrillation) ([place])   . Pulmonary hypertension ([place])   . Renal artery stenosis ([facility])    a. >60%  R renal artery stenosis by duplex 2021, managed medically.  . Severe aortic stenosis   . Stage 4 chronic kidney disease ([place])   . Transfusion-dependent anemia     Past Surgical History:  Procedure Laterality Date  . AV FISTULA PLACEMENT Right 09/26/2020   Procedure: RIGHT ARM ARTERIOVENOUS (AV) FISTULA CREATION;  Surgeon: Waynetta Sandy, MD;  Location[facility];  Service: Vascular;  Laterality: Right;  Monitor Anesthesia Care with Regional Block to Right Arm   . COLON SURGERY    . DEBRIDMENT OF DECUBITUS ULCER N/A 06/12/2018   Procedure: DEBRIDMENT OF DECUBITUS ULCER;  Surgeon: Wallace Going, DO;  Location: [facility] ORS;  Service: Plastics;  Laterality: N/A;  . ESOPHAGOGASTRODUODENOSCOPY N/A 09/01/2020   Procedure: ESOPHAGOGASTRODUODENOSCOPY (EGD);  Surgeon: Clarene Essex, MD;  Location: [facility];  Service: Endoscopy;  Laterality: N/A;  . GIVENS CAPSULE STUDY N/A 09/01/2020   Procedure: GIVENS CAPSULE STUDY;  Surgeon: Clarene Essex, MD;  Location: [facility] ENDOSCOPY;  Service: Endoscopy;  Laterality: N/A;  . IR FLUORO GUIDE CV LINE RIGHT  10/11/2020  . IR FLUORO GUIDE PORT INSERTION RIGHT  04/02/2017  . IR REMOVAL TUN ACCESS W/ PORT W/O FL MOD SED  03/16/2019  . IR US GUIDE VASC ACCESS RIGHT  04/02/2017  . IR US GUIDE VASC ACCESS RIGHT  10/11/2020  . RIGHT HEART CATH N/A 04/23/2019   Procedure: RIGHT HEART CATH;  Surgeon: Jolaine Artist, MD;  Location: [place] CV LAB;  Service: Cardiovascular;  Laterality: N/A;  . RIGHT/LEFT HEART CATH AND CORONARY ANGIOGRAPHY N/A 05/21/2018   Procedure: RIGHT/LEFT HEART CATH AND  CORONARY ANGIOGRAPHY;  Surgeon: Nelva Bush, MD;  Location: [facility];  Service: Cardiovascular;  Laterality: N/A;  . RIGHT/LEFT HEART CATH AND CORONARY ANGIOGRAPHY N/A 10/13/2020   Procedure: RIGHT/LEFT HEART CATH AND CORONARY ANGIOGRAPHY;  Surgeon: Jolaine Artist, MD;  Location: [place] CV LAB;  Service: Cardiovascular;  Laterality: N/A;  . TUBAL LIGATION       Social History  reports that she quit smoking about 25 years ago. Her smoking use included cigarettes. She has a 5.00 pack-year smoking history. She has quit using smokeless tobacco. She reports current alcohol use of about 2.0 standard drinks of alcohol per week. She reports that she does not use drugs.  Allergies  Allergen Reactions  . Ancef [Cefazolin] Itching    Severe itching- after procedure, ancef was the antibiotic.-04/02/17 Tolerates penicillins  . Lactose Intolerance (Gi) Diarrhea  . Sulfa Antibiotics Rash and Other (See Comments)    Blisters, also    Family History  Problem Relation Age of Onset  . Hypertension Mother   . Diabetes Mother   . Cervical cancer Mother   . Heart attack Father   . Hypertension Sister   . Hypertension Brother   . Hypertension Sister   . Hypertension Sister   . Prostate cancer Brother   . HIV/AIDS Brother     Prior to Admission medications   Medication Sig Start Date End Date Taking? Authorizing Provider  allopurinol (ZYLOPRIM) 300 MG tablet Take 300 mg by mouth daily. 07/05/20  Yes [provider]  amiodarone (PACERONE) 200 MG tablet Take 1 tablet (200 mg total) by mouth daily. 07/19/20  Yes Bensimhon, Shaune Pascal, MD  atorvastatin (LIPITOR) 40 MG tablet Take 1 tablet by mouth once daily 11/13/20  Yes [name], FNP  blood glucose meter kit and supplies Dispense based on patient and insurance preference. Use up to four times daily as directed. (FOR ICD-10 E10.9, E11.9). Patient taking differently: 1 each by Other route See admin instructions. Dispense based on patient and insurance preference. Use up to four times daily as directed. (FOR ICD-10 E10.9, E11.9). 11/04/18  Yes [name], FNP  Blood Glucose Monitoring Suppl (ONETOUCH ULTRALINK) w/Device KIT Check blood sugars twice daily E11.9 Patient taking differently: 1 each by Other route 2 (two) times daily. Check blood sugars twice daily E11.9 11/30/19  Yes [name], FNP   carvedilol (COREG) 6.25 MG tablet Take 6.25 mg by mouth 2 (two) times daily. 11/05/20  Yes [provider]  cloNIDine (CATAPRES) 0.1 MG tablet Take 1 tablet (0.1 mg total) by mouth 2 (two) times daily. 07/03/20  Yes Bensimhon, Shaune Pascal, MD  colchicine 0.6 MG tablet Take 0.6 mg by mouth daily as needed (gout).   Yes [provider]  fluticasone (FLONASE) 50 MCG/ACT nasal spray Place 1 spray into both nostrils daily. 01/28/20  Yes Collene Gobble, MD  glucose blood test strip check blood sugars twice daily Dx code E11.22 Patient taking differently: 1 each by Other route See admin instructions. check blood sugars twice daily Dx code E11.22 03/02/20  Yes [name], FNP  hydrALAZINE (APRESOLINE) 100 MG tablet Take 1 tablet (100 mg total) by mouth 3 (three) times daily. 09/27/20  Yes Lorretta Harp, MD  Insulin Glargine [facility] KWIKPEN) 100 UNIT/ML Inject 15 Units into the skin daily. Only if above 150   Yes [provider]  Insulin Pen Needle 29G X 5MM MISC Use as directed Patient taking differently: 1 each by Other route as directed. 06/16/17  Yes Bonnielee Haff, MD  Lancets (ONETOUCH DELICA PLUS [id number]) [place] check blood sugars twice daily Dx code: E11.22 Patient taking differently: 1 each by Other route See admin instructions. check blood sugars twice daily Dx code: E11.22 03/02/20  Yes [name], FNP  latanoprost (XALATAN) 0.005 % ophthalmic solution Place 1 drop into both eyes at bedtime.    Yes [provider]  levothyroxine (SYNTHROID) 150 MCG tablet Take 1 tablet (150 mcg total) by mouth daily before breakfast. 09/07/20  Yes [name], FNP  linagliptin (TRADJENTA) 5 MG TABS tablet Take 1 tablet (5 mg total) by mouth daily. 02/03/20  Yes [name], FNP  loratadine (CLARITIN) 10 MG tablet Take 10 mg by mouth daily.   Yes [provider]  Methoxy PEG-Epoetin Beta (MIRCERA IJ) Mircera 11/02/20 11/01/21 Yes [provider]  Multiple  Vitamins-Minerals (CENTRUM SILVER 50+WOMEN) TABS Take 1 tablet by mouth daily.   Yes [provider]  omeprazole (PRILOSEC) 40 MG capsule TAKE 1 CAPSULE BY MOUTH TWICE DAILY BEFORE A MEAL Patient taking differently: Take 40 mg by mouth daily. 11/15/20  Yes [name], FNP  polyethylene glycol (MIRALAX / GLYCOLAX) 17 g packet Take 17 g by mouth daily. 10/16/20  Yes Barb Merino, MD  sildenafil (REVATIO) 20 MG tablet Take 1 tablet (20 mg total) by mouth 3 (three) times daily. 09/06/20  Yes Bensimhon, Shaune Pascal, MD  sodium chloride (OCEAN) 0.65 % nasal spray Place 1 spray into the nose in the morning, at noon, in the evening, and at bedtime.   Yes [provider]  furosemide (LASIX) 40 MG tablet Take 1 tablet (40 mg total) by mouth daily. Take 80 mg in AM and 40 mg in PM (4PM) Patient not taking: Reported on 11/21/2020 07/09/20   Debbe Odea, MD  traZODone (DESYREL) 50 MG tablet Take 0.5 tablets (25 mg total) by mouth at bedtime. Patient not taking: Reported on 11/21/2020 11/03/20   [name], MD    Physical Exam: Vitals:   11/21/20 1308 11/21/20 1337 11/21/20 1401 11/21/20 1430  BP: (!) 174/50 (!) 186/53 (!) 171/97 (!) 157/95  Pulse: 86 86 91 89  Resp: '18 20 15 18  ' Temp: 98.5 F (36.9 C) 98.3 F (36.8 C) 97.8 F (36.6 C)   TempSrc:  Oral    SpO2: 100% 99% 100% 98%  Weight:      Height:        Constitutional: NAD, calm, comfortable, complaining of itching and chest pain free at rest. Vitals:   11/21/20 1308 11/21/20 1337 11/21/20 1401 11/21/20 1430  BP: (!) 174/50 (!) 186/53 (!) 171/97 (!) 157/95  Pulse: 86 86 91 89  Resp: '18 20 15 18  ' Temp: 98.5 F (36.9 C) 98.3 F (36.8 C) 97.8 F (36.6 C)   TempSrc:  Oral    SpO2: 100% 99% 100% 98%  Weight:      Height:       Eyes: PERRL, lids and conjunctivae normal, no icterus, no nystagmus. ENMT: Mucous membranes are moist. Posterior pharynx clear of any exudate or lesions. Neck: normal, supple, no masses, no  thyromegaly Respiratory: clear to auscultation bilaterally, no wheezing, no crackles. Normal respiratory effort. No accessory muscle use. Good saturation on RA. Cardiovascular: Regular rate and positive SEM, no rubs, no gallops.  Abdomen: no tenderness, no masses palpated. No hepatosplenomegaly. Bowel sounds positive.  Musculoskeletal: no clubbing / cyanosis. No joint deformity upper and lower extremities. Good ROM, no contractures. RUE with AVF in place. Skin:  no rashes, no petechiae. Neurologic: CN 2-12 grossly intact. Sensation intact, DTR normal.  Psychiatric: Normal judgment and insight. Alert and oriented x 3. Normal mood.   Labs on Admission: I have personally reviewed following labs and imaging studies  CBC: Recent Labs  Lab 11/21/20 1102 11/21/20 1108  WBC 5.0  --   NEUTROABS 3.0  --   HGB 7.3* 7.5*  HCT 20.7* 22.0*  MCV 89.6  --   PLT 223  --     Basic Metabolic Panel: Recent Labs  Lab 11/21/20 1102 11/21/20 1108  NA 132* 130*  K 4.4 4.5  CL 95* 96*  CO2 22  --   GLUCOSE 161* 160*  BUN 54* 56*  CREATININE 2.59* 2.70*  CALCIUM 9.6  --     GFR: Estimated Creatinine Clearance: 22.4 mL/min (A) (by C-G formula based on SCr of 2.7 mg/dL (H)).  Liver Function Tests: Recent Labs  Lab 11/21/20 1102  AST 59*  ALT 35  ALKPHOS 160*  BILITOT 1.1  PROT 7.3  ALBUMIN 3.8    Urine analysis:    Component Value Date/Time   COLORURINE AMBER (A) 10/09/2020 2320   APPEARANCEUR CLOUDY (A) 10/09/2020 2320   LABSPEC 1.016 10/09/2020 2320   PHURINE 5.0 10/09/2020 2320   GLUCOSEU NEGATIVE 10/09/2020 2320   HGBUR SMALL (A) 10/09/2020 2320   BILIRUBINUR NEGATIVE 10/09/2020 2320   BILIRUBINUR negative 05/11/2020 1152   KETONESUR NEGATIVE 10/09/2020 2320   PROTEINUR 100 (A) 10/09/2020 2320   UROBILINOGEN 0.2 05/11/2020 1152   NITRITE NEGATIVE 10/09/2020 2320   LEUKOCYTESUR MODERATE (A) 10/09/2020 2320    Radiological Exams on Admission: DG Chest Port 1  View  Result Date: 11/21/2020 CLINICAL DATA:  65 year old female with chest pain. Dialysis patient. EXAM: PORTABLE CHEST 1 VIEW COMPARISON:  Portable chest 10/09/2020. Chest CTA 10/12/2020, and earlier. FINDINGS: Portable AP semi upright view at 1120 hours. Right chest dual lumen dialysis type catheter now in place. Stable lung volumes and mediastinal contours with mild cardiomegaly. Visualized tracheal air column is within normal limits. Stable pulmonary vascularity over this series of exams. No pneumothorax, pleural effusion or confluent pulmonary opacity. No acute osseous abnormality identified. IMPRESSION: Stable cardiomegaly and vascular congestion without overt edema. Electronically Signed   By: Genevie Ann M.D.   On: 11/21/2020 11:27    EKG: Independently reviewed. No acute ischemic changes appreciated.  Assessment/Plan 1-Chest pain -with prior hx of MI and cath (approx 3 weeks ago) -troponin with flat elevation currently -cardiology consulted (Dr. Burt Knack) -PPI increased to BID -continue b-blocker, hydralazine and statins -will repeat 2 more troponin and follow cardiology service rec's -given Hgb level below threshold, will transfuse 1 unit of PRBC.  2-ESRD -discussed with Dr. Royce Macadamia -not indication for acute HD currently -the plan is to arrange for HD (special date) if patient discharge tomorrow and then resume usual treatment on Thursday 11/23/20  3-HLD -continue statins  4-type 2 diabetes -continue SSI -follow CBG's -continue tradjenta  5-GERD -continue PPI, dose adjusted to BID  6-pulmonary HTN and A. Fib -continue revatio and Pacerone  7-hypothyroidism -continue synthroid  8-chronic diastolic HF and severe aortic stenosis -will follow rec's by cardiology service -daily weights and strict intake and output -continue volume management with HD -currently compensated   DVT prophylaxis: SCD's/heparin Code Status:   Full Code Family Communication:  No family at  bedside. Disposition Plan:   Patient is from:  home  Anticipated DC to:  home  Anticipated DC date:  11/22/20  Anticipated  DC barriers: Complete transfusion and chest pain rule out. Consults called:  Cardiology and renal service) Admission status:  Observation, telemetry, LOS < 2 midnights   Severity of Illness: Moderate severity, symptomatic anemia and CP. Hx of non obstructive CAD, pulmonary HTN and GERD. Presenting with Hgb 7.3 and CP. Patient will be transfuse, troponin cycle and follow cardiology rec's.    Barton Dubois MD [facility]  How to contact the [facility] Attending or Consulting provider [place] or covering provider during after hours [place], for this patient?   1. Check the care team in [facility] and look for a) attending/consulting [facility] provider listed and b) the [facility] team listed 2. Log into [email or web address] and use [place]'s universal password to access. If you do not have the password, please contact the hospital operator. 3. Locate the [facility] provider you are looking for under [facility] and page to a number that you can be directly reached. 4. If you still have difficulty reaching the provider, please page the [facility] (Director on Call) for the Hospitalists listed on amion for assistance.  11/21/2020, 3:09 PM

## 2020-11-21 NOTE — ED Notes (Signed)
emesis noted after hydromorphone admin. MD aware.

## 2020-11-21 NOTE — ED Notes (Signed)
Patricia Mata provided to patient

## 2020-11-21 NOTE — ED Notes (Signed)
Called [facility] to check on hand off report note.

## 2020-11-21 NOTE — ED Provider Notes (Signed)
[facility] DEPT Provider Note   CSN: [id number] Arrival date & time: 11/21/20  1028     History Chief Complaint  Patient presents with  . Chest Pain    Patricia Mata is a 65 y.o. female.  HPI Patient was on her way to dialysis when she got a severe left chest pain radiating into her shoulder.  Patient reports the pain is quite severe and she feels somewhat short of breath with it.  She denies she was feeling significantly short of breath before the chest pain started.  Patient reports this is the same pain she had when she had a heart attack due to a low blood count about a month ago.  She reports that she chronically has problems with anemia.  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  She denies she has any ongoing active cancer and is not undergoing chemotherapy anymore.  She however does have problems with recurrent anemia, undetermined etiology but due to a problem producing blood after her cancer treatment.  She denies an active bleeding source is ever been identified.  She reports she was feeling pretty well before she was getting ready to go to dialysis.  She has not been suffering from similar chest pain, unusual shortness of breath, fever chills or abdominal pain before this episode.    Past Medical History:  Diagnosis Date  . Acute renal failure (ARF) ([place]) 06/08/2018  . Carotid artery disease ([place])   . Chronic diastolic (congestive) heart failure ([place])   . Colon cancer ([place])   . Diabetes mellitus ([place])   . Gout 09/08/2018  . Heart attack ([place])   . Hypertension   . Hypothyroidism   . Macrocytic anemia   . MDS (myelodysplastic syndrome) ([place])   . Moderate mitral stenosis   . PAD (peripheral artery disease) ([place])   . PAF (paroxysmal atrial fibrillation) ([place])   . Pulmonary hypertension ([place])   . Renal artery stenosis (HCC)    a. >60% R renal artery stenosis by duplex 2021, managed medically.  .  Severe aortic stenosis   . Stage 4 chronic kidney disease ([place])   . Transfusion-dependent anemia     Patient Active Problem List   Diagnosis Date Noted  . NSTEMI (non-ST elevated myocardial infarction) ([place]) 10/10/2020  . Angina pectoris ([place]) 10/09/2020  . Angina at rest [facility]) 10/09/2020  . Gastroesophageal reflux disease 09/12/2020  . History of malignant neoplasm of colon 09/12/2020  . Chronic gastritis 09/02/2020  . Symptomatic anemia 08/31/2020  . Abnormal uterine bleeding (AUB) 07/19/2020  . Vaginal ulceration 07/19/2020  . Acute renal failure superimposed on stage 4 chronic kidney disease ([place])   . Palliative care by specialist   . Acute on chronic diastolic CHF (congestive heart failure) ([place]) 06/07/2020  . Chronic cough 05/04/2020  . Allergic rhinitis 03/23/2020  . Chronic kidney disease, stage 5 ([place]) 02/03/2020  . Acute diastolic CHF (congestive heart failure) ([place]) 09/21/2019  . HCAP (healthcare-associated pneumonia) 09/20/2019  . Pulmonary hypertension ([place]) 09/20/2019  . DM2 (diabetes mellitus, type 2) ([place]) 09/20/2019  . Elevated troponin 09/20/2019  . Severe sepsis ([place]) 09/20/2019  . CAP (community acquired pneumonia) 09/14/2019  . Hypoxia 09/14/2019  . Epistaxis, recurrent 08/10/2019  . Neutropenia ([place]) 05/25/2019  . Malignant neoplasm of colon ([place]) 05/25/2019  . Bradycardia 03/21/2019  . Pneumonia due to COVID-19 virus 03/18/2019  . Type 2 diabetes mellitus with hemoglobin A1c goal of less than 7.0% (HCC)  12/10/2018  . Hypothyroidism 12/10/2018  . Seasonal allergies 12/10/2018  . Gout 09/08/2018  . 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 Sacral wound 06/08/2018  . Iron deficiency anemia due to chronic blood loss 06/08/2018  . Hyponatremia 06/08/2018  . Constipation 06/08/2018  . Encounter for nasogastric (NG) tube placement   . Pressure injury of skin 05/22/2018  . PAF (paroxysmal atrial fibrillation) ([place]) 05/11/2018  . Normocytic anemia  06/14/2017  . Aortic stenosis, severe 06/10/2017  . Carotid artery disease ([place]) 06/10/2017  . Essential hypertension 05/13/2017  . Hyperlipidemia 05/13/2017  . Bilateral lower extremity edema 05/13/2017  . Port-A-Cath in place 04/28/2017  . MDS (myelodysplastic syndrome) ([place]) 03/20/2017  . Goals of care, counseling/discussion 03/20/2017  . Anemia in chronic kidney disease 05/10/2016  . Anemia in stage 1 chronic kidney disease 05/10/2016  . Anemia due to chronic kidney disease 02/09/2016    Past Surgical History:  Procedure Laterality Date  . AV FISTULA PLACEMENT Right 09/26/2020   Procedure: RIGHT ARM ARTERIOVENOUS (AV) FISTULA CREATION;  Surgeon: Waynetta Sandy, MD;  Location: [facility];  Service: Vascular;  Laterality: Right;  Monitor Anesthesia Care with Regional Block to Right Arm   . COLON SURGERY    . DEBRIDMENT OF DECUBITUS ULCER N/A 06/12/2018   Procedure: DEBRIDMENT OF DECUBITUS ULCER;  Surgeon: Wallace Going, DO;  Location: [facility] ORS;  Service: Plastics;  Laterality: N/A;  . ESOPHAGOGASTRODUODENOSCOPY N/A 09/01/2020   Procedure: ESOPHAGOGASTRODUODENOSCOPY (EGD);  Surgeon: Clarene Essex, MD;  Location: [facility];  Service: Endoscopy;  Laterality: N/A;  . GIVENS CAPSULE STUDY N/A 09/01/2020   Procedure: GIVENS CAPSULE STUDY;  Surgeon: Clarene Essex, MD;  Location: [facility] ENDOSCOPY;  Service: Endoscopy;  Laterality: N/A;  . IR FLUORO GUIDE CV LINE RIGHT  10/11/2020  . IR FLUORO GUIDE PORT INSERTION RIGHT  04/02/2017  . IR REMOVAL TUN ACCESS W/ PORT W/O FL MOD SED  03/16/2019  . IR US GUIDE VASC ACCESS RIGHT  04/02/2017  . IR US GUIDE VASC ACCESS RIGHT  10/11/2020  . RIGHT HEART CATH N/A 04/23/2019   Procedure: RIGHT HEART CATH;  Surgeon: Jolaine Artist, MD;  Location: [facility] CV LAB;  Service: Cardiovascular;  Laterality: N/A;  . RIGHT/LEFT HEART CATH AND CORONARY ANGIOGRAPHY N/A 05/21/2018   Procedure: RIGHT/LEFT HEART CATH AND CORONARY ANGIOGRAPHY;  Surgeon: Nelva Bush, MD;  Location: [facility] LAB;  Service: Cardiovascular;  Laterality: N/A;  . RIGHT/LEFT HEART CATH AND CORONARY ANGIOGRAPHY N/A 10/13/2020   Procedure: RIGHT/LEFT HEART CATH AND CORONARY ANGIOGRAPHY;  Surgeon: Jolaine Artist, MD;  Location: [facility] CV LAB;  Service: Cardiovascular;  Laterality: N/A;  . TUBAL LIGATION       OB History   No obstetric history on file.     Family History  Problem Relation Age of Onset  . Hypertension Mother   . Diabetes Mother   . Cervical cancer Mother   . Heart attack Father   . Hypertension Sister   . Hypertension Brother   . Hypertension Sister   . Hypertension Sister   . Prostate cancer Brother   . HIV/AIDS Brother     Social History   Tobacco Use  . Smoking status: Former Smoker    Packs/day: 0.25    Years: 20.00    Pack years: 5.00    Types: Cigarettes    Quit date: 1997    Years since quitting: 25.3  . Smokeless tobacco: Former Network engineer  . Vaping  Use: Never used  Substance Use Topics  . Alcohol use: Yes    Alcohol/week: 2.0 standard drinks    Types: 2 Shots of liquor per week    Comment: occ  . Drug use: Never    Home Medications Prior to Admission medications   Medication Sig Start Date End Date Taking? Authorizing Provider  allopurinol (ZYLOPRIM) 300 MG tablet Take 300 mg by mouth daily. 07/05/20   [provider]  amiodarone (PACERONE) 200 MG tablet Take 1 tablet (200 mg total) by mouth daily. 07/19/20   Bensimhon, Shaune Pascal, MD  atorvastatin (LIPITOR) 40 MG tablet Take 1 tablet by mouth once daily 11/13/20   [name], FNP  blood glucose meter kit and supplies Dispense based on patient and insurance preference. Use up to four times daily as directed. (FOR ICD-10 E10.9, E11.9). 11/04/18   [name], FNP  Blood Glucose Monitoring Suppl [facility] Karsten Fells) w/Device KIT Check blood sugars twice daily E11.9 11/30/19   [name], FNP  carvedilol (COREG) 6.25 MG tablet Take 6.25 mg  by mouth 2 (two) times daily. 11/05/20   [provider]  cloNIDine (CATAPRES) 0.1 MG tablet Take 1 tablet (0.1 mg total) by mouth 2 (two) times daily. 07/03/20   Bensimhon, Shaune Pascal, MD  colchicine 0.6 MG tablet Take 0.6 mg by mouth daily as needed (gout).    [provider]  fluticasone (FLONASE) 50 MCG/ACT nasal spray Place 1 spray into both nostrils daily. 01/28/20   Collene Gobble, MD  furosemide (LASIX) 40 MG tablet Take 1 tablet (40 mg total) by mouth daily. Take 80 mg in AM and 40 mg in PM (4PM) 07/09/20   Debbe Odea, MD  glucose blood test strip check blood sugars twice daily Dx code E11.22 03/02/20   [name], FNP  hydrALAZINE (APRESOLINE) 100 MG tablet Take 1 tablet (100 mg total) by mouth 3 (three) times daily. 09/27/20   Lorretta Harp, MD  Insulin Glargine [facility]) 100 UNIT/ML Inject 100 Units into the skin daily. Per patient taking 15 units    [provider]  Insulin Pen Needle 29G X 5MM MISC Use as directed 06/16/17   Bonnielee Haff, MD  Lancets (ONETOUCH DELICA PLUS [id number]) [place] check blood sugars twice daily Dx code: E11.22 03/02/20   [name], FNP  latanoprost (XALATAN) 0.005 % ophthalmic solution Place 1 drop into both eyes at bedtime.     [provider]  levothyroxine (SYNTHROID) 150 MCG tablet Take 1 tablet (150 mcg total) by mouth daily before breakfast. 09/07/20   [name], FNP  linagliptin (TRADJENTA) 5 MG TABS tablet Take 1 tablet (5 mg total) by mouth daily. 02/03/20   [name], FNP  loratadine (CLARITIN) 10 MG tablet Take 10 mg by mouth daily.    [provider]  Methoxy PEG-Epoetin Beta (MIRCERA IJ) Mircera 11/02/20 11/01/21  [provider]  Multiple Vitamins-Minerals (CENTRUM SILVER 50+WOMEN) TABS Take 1 tablet by mouth daily.    [provider]  omeprazole (PRILOSEC) 40 MG capsule TAKE 1 CAPSULE BY MOUTH TWICE DAILY BEFORE A MEAL 11/15/20   [name], FNP  polyethylene  glycol (MIRALAX / GLYCOLAX) 17 g packet Take 17 g by mouth daily. 10/16/20   Barb Merino, MD  sildenafil (REVATIO) 20 MG tablet Take 1 tablet (20 mg total) by mouth 3 (three) times daily. 09/06/20   Bensimhon, Shaune Pascal, MD  sodium chloride (OCEAN) 0.65 % nasal spray Place 1 spray into the nose in the  morning, at noon, in the evening, and at bedtime.    [provider]  traZODone (DESYREL) 50 MG tablet Take 0.5 tablets (25 mg total) by mouth at bedtime. 11/03/20   [name], MD    Allergies    Ancef [cefazolin], Lactose intolerance (gi), and Sulfa antibiotics  Review of Systems   Review of Systems 10 systems reviewed and negative except as per HPI. Physical Exam Updated Vital Signs BP (!) 186/52 (BP Location: Left Arm)   Pulse 94   Temp 98.8 F (37.1 C) (Oral)   Resp (!) 23   Ht '5\' 4"'  (1.626 m)   Wt 91 kg   SpO2 99%   BMI 34.44 kg/m   Physical Exam Constitutional:      Appearance: She is well-developed.     Comments: Alert.  Mental status clear.  No respiratory distress.  Appears uncomfortable.  HENT:     Head: Normocephalic and atraumatic.     Mouth/Throat:     Pharynx: Oropharynx is clear.  Eyes:     Extraocular Movements: Extraocular movements intact.  Cardiovascular:     Rate and Rhythm: Normal rate and regular rhythm.     Heart sounds: Murmur heard.    Pulmonary:     Effort: Pulmonary effort is normal.     Breath sounds: Normal breath sounds.     Comments: Dialysis catheter in the right chest wall. Abdominal:     General: Bowel sounds are normal. There is no distension.     Palpations: Abdomen is soft.     Tenderness: There is no abdominal tenderness.  Musculoskeletal:        General: Normal range of motion.     Cervical back: Neck supple.     Comments: AV fistula in the right upper extremity with good thrill.  Bilateral lower extremities no edema or calf tenderness.  Skin:    General: Skin is warm and dry.  Neurological:     Mental Status: She  is alert and oriented to person, place, and time.     GCS: GCS eye subscore is 4. GCS verbal subscore is 5. GCS motor subscore is 6.     Coordination: Coordination normal.     ED Results / Procedures / Treatments   Labs (all labs ordered are listed, but only abnormal results are displayed) Labs Reviewed  BASIC METABOLIC PANEL  CBC  TROPONIN I (HIGH SENSITIVITY)    EKG EKG Interpretation  Date/Time:  Tuesday Nov 21 2020 10:37:25 EDT Ventricular Rate:  95 PR Interval:  202 QRS Duration: 93 QT Interval:  389 QTC Calculation: 489 R Axis:   13 Text Interpretation: Sinus rhythm Minimal ST depression, lateral leads Borderline prolonged QT interval 12 Lead; [place] No significant change since prior 4/22 Confirmed by Aletta Edouard [phone]) on 11/23/2020 10:59:29 AM   Radiology No results found.  Procedures Procedures  CRITICAL CARE Performed by: Charlesetta Shanks   Total critical care time: 30 minutes  Critical care time was exclusive of separately billable procedures and treating other patients.  Critical care was necessary to treat or prevent imminent or life-threatening deterioration.  Critical care was time spent personally by me on the following activities: development of treatment plan with patient and/or surrogate as well as nursing, discussions with consultants, evaluation of patient's response to treatment, examination of patient, obtaining history from patient or surrogate, ordering and performing treatments and interventions, ordering and review of laboratory studies, ordering and review of radiographic studies, pulse oximetry and re-evaluation of  patient's condition. Medications Ordered in ED Medications - No data to display  ED Course  I have reviewed the triage vital signs and the nursing notes.  Pertinent labs & imaging results that were available during my care of the patient were reviewed by me and considered in my medical decision making (see chart for  details).    MDM Rules/Calculators/A&P                          Patient presents with severe chest pain.  She has significant comorbid conditions and coronary artery disease with prior MI.  Patient also was concerned for anemia.  She reports on last presentation she had severe anemia precipitating her cardiac event.  EKG did not show STEMI pattern.  Troponins are not significantly elevated.  Patient's globin is below 8.  She certainly had been much lower previously but at this time with known cardiac disease and potential demand ischemia will opt to transfuse 1 unit.  Patient will be admitted for observation and continued cardiac rule out.  Final Clinical Impression(s) / ED Diagnoses Final diagnoses:  Precordial chest pain  Severe comorbid illness  Symptomatic anemia    Rx / DC Orders ED Discharge Orders    None       Charlesetta Shanks, MD 11/30/20 1104

## 2020-11-21 NOTE — Plan of Care (Signed)
Initiated care plan 

## 2020-11-21 NOTE — ED Notes (Signed)
Called MD for covid test for bed placement.

## 2020-11-21 NOTE — ED Notes (Signed)
Blood bank has blood ready for this patient. Notified Ashley,RN.

## 2020-11-21 NOTE — ED Notes (Signed)
Blood consent signed

## 2020-11-22 DIAGNOSIS — I080 Rheumatic disorders of both mitral and aortic valves: Secondary | ICD-10-CM | POA: Diagnosis present

## 2020-11-22 DIAGNOSIS — E785 Hyperlipidemia, unspecified: Secondary | ICD-10-CM | POA: Diagnosis present

## 2020-11-22 DIAGNOSIS — E739 Lactose intolerance, unspecified: Secondary | ICD-10-CM | POA: Diagnosis present

## 2020-11-22 DIAGNOSIS — Z20822 Contact with and (suspected) exposure to covid-19: Secondary | ICD-10-CM | POA: Diagnosis present

## 2020-11-22 DIAGNOSIS — I5032 Chronic diastolic (congestive) heart failure: Secondary | ICD-10-CM | POA: Diagnosis present

## 2020-11-22 DIAGNOSIS — I132 Hypertensive heart and chronic kidney disease with heart failure and with stage 5 chronic kidney disease, or end stage renal disease: Secondary | ICD-10-CM | POA: Diagnosis present

## 2020-11-22 DIAGNOSIS — R072 Precordial pain: Secondary | ICD-10-CM | POA: Diagnosis not present

## 2020-11-22 DIAGNOSIS — D469 Myelodysplastic syndrome, unspecified: Secondary | ICD-10-CM | POA: Diagnosis present

## 2020-11-22 DIAGNOSIS — D631 Anemia in chronic kidney disease: Secondary | ICD-10-CM | POA: Diagnosis present

## 2020-11-22 DIAGNOSIS — Z992 Dependence on renal dialysis: Secondary | ICD-10-CM | POA: Diagnosis not present

## 2020-11-22 DIAGNOSIS — E039 Hypothyroidism, unspecified: Secondary | ICD-10-CM | POA: Diagnosis present

## 2020-11-22 DIAGNOSIS — E8770 Fluid overload, unspecified: Secondary | ICD-10-CM | POA: Diagnosis present

## 2020-11-22 DIAGNOSIS — I272 Pulmonary hypertension, unspecified: Secondary | ICD-10-CM | POA: Diagnosis present

## 2020-11-22 DIAGNOSIS — I252 Old myocardial infarction: Secondary | ICD-10-CM | POA: Diagnosis not present

## 2020-11-22 DIAGNOSIS — I480 Paroxysmal atrial fibrillation: Secondary | ICD-10-CM | POA: Diagnosis present

## 2020-11-22 DIAGNOSIS — I2510 Atherosclerotic heart disease of native coronary artery without angina pectoris: Secondary | ICD-10-CM | POA: Diagnosis present

## 2020-11-22 DIAGNOSIS — N186 End stage renal disease: Secondary | ICD-10-CM | POA: Diagnosis present

## 2020-11-22 DIAGNOSIS — Z9115 Patient's noncompliance with renal dialysis: Secondary | ICD-10-CM | POA: Diagnosis not present

## 2020-11-22 DIAGNOSIS — J9601 Acute respiratory failure with hypoxia: Secondary | ICD-10-CM | POA: Diagnosis present

## 2020-11-22 DIAGNOSIS — M109 Gout, unspecified: Secondary | ICD-10-CM | POA: Diagnosis present

## 2020-11-22 DIAGNOSIS — R0902 Hypoxemia: Secondary | ICD-10-CM

## 2020-11-22 DIAGNOSIS — Z79899 Other long term (current) drug therapy: Secondary | ICD-10-CM | POA: Diagnosis not present

## 2020-11-22 DIAGNOSIS — Z9851 Tubal ligation status: Secondary | ICD-10-CM | POA: Diagnosis not present

## 2020-11-22 DIAGNOSIS — E1122 Type 2 diabetes mellitus with diabetic chronic kidney disease: Secondary | ICD-10-CM | POA: Diagnosis present

## 2020-11-22 DIAGNOSIS — E1151 Type 2 diabetes mellitus with diabetic peripheral angiopathy without gangrene: Secondary | ICD-10-CM | POA: Diagnosis present

## 2020-11-22 DIAGNOSIS — K219 Gastro-esophageal reflux disease without esophagitis: Secondary | ICD-10-CM | POA: Diagnosis present

## 2020-11-22 LAB — RENAL FUNCTION PANEL
Albumin: 3.2 g/dL — ABNORMAL LOW (ref 3.5–5.0)
Anion gap: 14 (ref 5–15)
BUN: 62 mg/dL — ABNORMAL HIGH (ref 8–23)
CO2: 20 mmol/L — ABNORMAL LOW (ref 22–32)
Calcium: 9.2 mg/dL (ref 8.9–10.3)
Chloride: 97 mmol/L — ABNORMAL LOW (ref 98–111)
Creatinine, Ser: 3.11 mg/dL — ABNORMAL HIGH (ref 0.44–1.00)
GFR, Estimated: 16 mL/min — ABNORMAL LOW (ref >60)
Glucose, Bld: 230 mg/dL — ABNORMAL HIGH (ref 70–99)
Phosphorus: 4.3 mg/dL (ref 2.5–4.6)
Potassium: 5.8 mmol/L — ABNORMAL HIGH (ref 3.5–5.1)
Sodium: 131 mmol/L — ABNORMAL LOW (ref 135–145)

## 2020-11-22 LAB — CBC
HCT: 21.8 % — ABNORMAL LOW (ref 36.0–46.0)
Hemoglobin: 7.6 g/dL — ABNORMAL LOW (ref 12.0–15.0)
MCH: 31.8 pg (ref 26.0–34.0)
MCHC: 34.9 g/dL (ref 30.0–36.0)
MCV: 91.2 fL (ref 80.0–100.0)
Platelets: 190 10*3/uL (ref 150–400)
RBC: 2.39 MIL/uL — ABNORMAL LOW (ref 3.87–5.11)
RDW: 20.7 % — ABNORMAL HIGH (ref 11.5–15.5)
WBC: 5.8 10*3/uL (ref 4.0–10.5)
nRBC: 1.7 % — ABNORMAL HIGH (ref 0.0–0.2)

## 2020-11-22 LAB — TYPE AND SCREEN
ABO/RH(D): A NEG
Antibody Screen: NEGATIVE
Unit division: 0

## 2020-11-22 LAB — HEPATITIS B SURFACE ANTIGEN: Hepatitis B Surface Ag: NONREACTIVE

## 2020-11-22 LAB — BASIC METABOLIC PANEL
Anion gap: 12 (ref 5–15)
BUN: 67 mg/dL — ABNORMAL HIGH (ref 8–23)
CO2: 24 mmol/L (ref 22–32)
Calcium: 9.4 mg/dL (ref 8.9–10.3)
Chloride: 97 mmol/L — ABNORMAL LOW (ref 98–111)
Creatinine, Ser: 3.18 mg/dL — ABNORMAL HIGH (ref 0.44–1.00)
GFR, Estimated: 16 mL/min — ABNORMAL LOW (ref >60)
Glucose, Bld: 175 mg/dL — ABNORMAL HIGH (ref 70–99)
Potassium: 4.7 mmol/L (ref 3.5–5.1)
Sodium: 133 mmol/L — ABNORMAL LOW (ref 135–145)

## 2020-11-22 LAB — BPAM RBC
Blood Product Expiration Date: 202205242359
ISSUE DATE / TIME: 202205101334
Unit Type and Rh: 600

## 2020-11-22 MED ORDER — ALTEPLASE 2 MG IJ SOLR: 2.0000 mg | Freq: Once | INTRAMUSCULAR | Status: DC | PRN

## 2020-11-22 MED ORDER — LIDOCAINE-PRILOCAINE 2.5-2.5 % EX CREA: 1.0000 "application " | TOPICAL_CREAM | CUTANEOUS | Status: DC | PRN

## 2020-11-22 MED ORDER — DIPHENHYDRAMINE HCL 12.5 MG/5ML PO ELIX
12.5000 mg | ORAL_SOLUTION | Freq: Three times a day (TID) | ORAL | Status: DC | PRN
  Filled 2020-11-22: qty 5

## 2020-11-22 MED ORDER — SODIUM CHLORIDE 0.9 % IV SOLN: 100.0000 mL | INTRAVENOUS | Status: DC | PRN

## 2020-11-22 MED ORDER — HEPARIN SODIUM (PORCINE) 1000 UNIT/ML IJ SOLN
INTRAMUSCULAR | Status: AC
  Filled 2020-11-22: qty 4

## 2020-11-22 MED ORDER — PENTAFLUOROPROP-TETRAFLUOROETH EX AERO: 1.0000 "application " | INHALATION_SPRAY | CUTANEOUS | Status: DC | PRN

## 2020-11-22 MED ORDER — CHLORHEXIDINE GLUCONATE CLOTH 2 % EX PADS: 6.0000 | MEDICATED_PAD | Freq: Every day | CUTANEOUS | Status: DC

## 2020-11-22 MED ORDER — HEPARIN SODIUM (PORCINE) 1000 UNIT/ML DIALYSIS: 1000.0000 [IU] | INTRAMUSCULAR | Status: DC | PRN

## 2020-11-22 MED ORDER — CHLORHEXIDINE GLUCONATE CLOTH 2 % EX PADS
6.0000 | MEDICATED_PAD | Freq: Every day | CUTANEOUS | Status: DC
  Administered 2020-11-22: 6 via TOPICAL

## 2020-11-22 MED ORDER — LIDOCAINE HCL (PF) 1 % IJ SOLN: 5.0000 mL | INTRAMUSCULAR | Status: DC | PRN

## 2020-11-22 NOTE — Consult Note (Addendum)
Cardiology Consultation:   Patient ID: Patricia Mata MRN: [id number]; DOB: [birth year]  Admit date: 11/21/2020 Date of Consult: 11/22/2020  PCP:  [name], [place] [facility] Cardiologist:  Quay Burow, MD  Advanced Heart Failure:  Glori Bickers, MD       Patient Profile:   Patricia Mata is a 65 y.o. female with a hx of PAF, pulmonary HTN, severe AS, moderate MS, obesity, DM2, HTN, ESRD on HD; colon cancer 2008 s/p hemicolectomy/chemo with resultant myelodysplastic syndrome and transfusion dependent anemia, carotid artery disease with near occlusion of RICA and moderate stenosis in the LICA with disturbed subclavian flow, chronic diastolic heart failure, and   who is being seen 11/22/2020 for the evaluation of chest pain at the request of Dr. Reesa Chew.  History of Present Illness:   Patricia Mata was admitted 04/2018 with Afib RVR and recurrent anemia. Respiratory failure required intubation. She converted to sinus on amiodarone, no OAC due to anemia. CKD required short term HD. Heart cath 05/21/18 with no significant CAD, severe pulmonary HTN, moderate AS, moderate MS. AHF team consulted for HFpEF and PH. Felt not a candidate for intervention of AS/MS due to [facility] and transfusion dependent myelodysplasia. Started on sildenafil, RHC 04/2019 with mild PAH with high output, likely due to anemia. She has difficult to control HTN. Greater than 60% right renal artery stenosis and celiac/mesenteric PAD. Dr. Gwenlyn Found evaluated and recommended medical management. She has since had several admissions for CAP, CHF exacerbation, and AKI. Progression to HD was suggested. Dr. Haroldine Laws was concerned about tolerability of HD given AS and referred her to Dr. Burt Knack for consideration of TAVR. He agreed to TAVR workup after AV fistula placed so she can have R/LHC and CT studies with contrast. Seh was admitted 08/2020 for anemia with Hb 4.1 and positive FOBT. GI evaluated, chronic gastritis. AV  fistula placed 09/26/20. She was again admitted 10/09/20 with chest pain and elevated troponin (7352) and EKG with global ST depression suggestive of ischemia. CP and EKG changes resolved with transfusion. Echo with EF 50-55%, no WMA, mild RV dysfunction, severe pulmonary HTN with RSVP 89.7 mmHg, moderate MS, moderate to severe MAC, and progression of severe AS with mean gradient 65 mmHg/peak gradient 104.6 mmHg, AVA 0.71 cm2.Creatinine continued to rise and nephrology consulted, suspected uremic platelet dysfunction. HD initiated with temp HD catheter. Concern for tolerance of HD given severe AS and structural heart team were consulted for balloon valvuloplasty as a bridge to TAVR. Dr. Burt Knack evaluated 10/11/20. Workup included CT imaging which showed near occlusive calcific plaque throughout the abdominal aorta. Concluded that interventional team would not be able to access from the femoral approach for balloon or TAVR. Repeat R/L HC with significant CAD of 60% mid LM, otherwise nonobstructive disease, moderate to severe PH due to high output, moderate to severe AS. Decision was made to see her back in clinic for ongoing shared decision making. She was seen by Dr. Burt Knack 11/20/20.   Unfortunately, she [facility]. She was on her way to HD when she developed left arm pain and chest pain. She instead drove to ER.   On arrival: Hb 7.3 BNP 1055 Lactic acid 2.8 sCr 2.59 hS troponin  23 --> 31 --> 79 --> 115  Because CP resolved with transfusion during her last episode, she was transfused with Hb improved to 7.5. She is now chest pain free. She was due to have blood test +/- scheduled transfusion on Thursday. She denies  SOB, palpitations, and lower extremity edema. She feels much better today. However, RN at bedside reports she is de-satting in the low 80s when [place] O2 is removed. CXR reviewed on arrival with stable vascular congestion.    Past Medical History:  Diagnosis Date  . Acute renal  failure (ARF) ([place]) 06/08/2018  . Carotid artery disease ([place])   . Chronic diastolic (congestive) heart failure ([place])   . Colon cancer ([place])   . Diabetes mellitus ([place])   . Gout 09/08/2018  . Heart attack ([place])   . Hypertension   . Hypothyroidism   . Macrocytic anemia   . MDS (myelodysplastic syndrome) ([place])   . Moderate mitral stenosis   . PAD (peripheral artery disease) ([place])   . PAF (paroxysmal atrial fibrillation) ([place])   . Pulmonary hypertension ([place])   . Renal artery stenosis ([facility])    a. >60% R renal artery stenosis by duplex 2021, managed medically.  . Severe aortic stenosis   . Stage 4 chronic kidney disease ([place])   . Transfusion-dependent anemia     Past Surgical History:  Procedure Laterality Date  . AV FISTULA PLACEMENT Right 09/26/2020   Procedure: RIGHT ARM ARTERIOVENOUS (AV) FISTULA CREATION;  Surgeon: Waynetta Sandy, MD;  Location: [facility] OR;  Service: Vascular;  Laterality: Right;  Monitor Anesthesia Care with Regional Block to Right Arm   . COLON SURGERY    . DEBRIDMENT OF DECUBITUS ULCER N/A 06/12/2018   Procedure: DEBRIDMENT OF DECUBITUS ULCER;  Surgeon: Wallace Going, DO;  Location: [facility] ORS;  Service: Plastics;  Laterality: N/A;  . ESOPHAGOGASTRODUODENOSCOPY N/A 09/01/2020   Procedure: ESOPHAGOGASTRODUODENOSCOPY (EGD);  Surgeon: Clarene Essex, MD;  Location: [facility];  Service: Endoscopy;  Laterality: N/A;  . GIVENS CAPSULE STUDY N/A 09/01/2020   Procedure: GIVENS CAPSULE STUDY;  Surgeon: Clarene Essex, MD;  Location: [facility] ENDOSCOPY;  Service: Endoscopy;  Laterality: N/A;  . IR FLUORO GUIDE CV LINE RIGHT  10/11/2020  . IR FLUORO GUIDE PORT INSERTION RIGHT  04/02/2017  . IR REMOVAL TUN ACCESS W/ PORT W/O FL MOD SED  03/16/2019  . IR US GUIDE VASC ACCESS RIGHT  04/02/2017  . IR US GUIDE VASC ACCESS RIGHT  10/11/2020  . RIGHT HEART CATH N/A 04/23/2019   Procedure: RIGHT HEART CATH;  Surgeon: Jolaine Artist, MD;  Location: [place] CV LAB;  Service:  Cardiovascular;  Laterality: N/A;  . RIGHT/LEFT HEART CATH AND CORONARY ANGIOGRAPHY N/A 05/21/2018   Procedure: RIGHT/LEFT HEART CATH AND CORONARY ANGIOGRAPHY;  Surgeon: Nelva Bush, MD;  Location: [place] CV LAB;  Service: Cardiovascular;  Laterality: N/A;  . RIGHT/LEFT HEART CATH AND CORONARY ANGIOGRAPHY N/A 10/13/2020   Procedure: RIGHT/LEFT HEART CATH AND CORONARY ANGIOGRAPHY;  Surgeon: Jolaine Artist, MD;  Location: [place] CV LAB;  Service: Cardiovascular;  Laterality: N/A;  . TUBAL LIGATION       Home Medications:  Prior to Admission medications   Medication Sig Start Date End Date Taking? Authorizing Provider  allopurinol (ZYLOPRIM) 300 MG tablet Take 300 mg by mouth daily. 07/05/20  Yes [provider]  amiodarone (PACERONE) 200 MG tablet Take 1 tablet (200 mg total) by mouth daily. 07/19/20  Yes Bensimhon, Shaune Pascal, MD  atorvastatin (LIPITOR) 40 MG tablet Take 1 tablet by mouth once daily 11/13/20  Yes [name], FNP  blood glucose meter kit and supplies Dispense based on patient and insurance preference. Use up to four times daily as directed. (FOR ICD-10 E10.9, E11.9). Patient taking differently: 1 each  by Other route See admin instructions. Dispense based on patient and insurance preference. Use up to four times daily as directed. (FOR ICD-10 E10.9, E11.9). 11/04/18  Yes [name], FNP  Blood Glucose Monitoring Suppl (ONETOUCH ULTRALINK) w/Device KIT Check blood sugars twice daily E11.9 Patient taking differently: 1 each by Other route 2 (two) times daily. Check blood sugars twice daily E11.9 11/30/19  Yes [name], FNP  carvedilol (COREG) 6.25 MG tablet Take 6.25 mg by mouth 2 (two) times daily. 11/05/20  Yes [provider]  cloNIDine (CATAPRES) 0.1 MG tablet Take 1 tablet (0.1 mg total) by mouth 2 (two) times daily. 07/03/20  Yes Bensimhon, Shaune Pascal, MD  colchicine 0.6 MG tablet Take 0.6 mg by mouth daily as needed (gout).   Yes [provider]  fluticasone (FLONASE) 50 MCG/ACT nasal spray Place 1 spray into both nostrils daily. 01/28/20  Yes Collene Gobble, MD  glucose blood test strip check blood sugars twice daily Dx code E11.22 Patient taking differently: 1 each by Other route See admin instructions. check blood sugars twice daily Dx code E11.22 03/02/20  Yes [name], FNP  hydrALAZINE (APRESOLINE) 100 MG tablet Take 1 tablet (100 mg total) by mouth 3 (three) times daily. 09/27/20  Yes Lorretta Harp, MD  Insulin Glargine [facility] KWIKPEN) 100 UNIT/ML Inject 15 Units into the skin daily. Only if above 150   Yes [provider]  Insulin Pen Needle 29G X 5MM MISC Use as directed Patient taking differently: 1 each by Other route as directed. 06/16/17  Yes Bonnielee Haff, MD  Lancets (ONETOUCH DELICA PLUS [id number]) [place] check blood sugars twice daily Dx code: E11.22 Patient taking differently: 1 each by Other route See admin instructions. check blood sugars twice daily Dx code: E11.22 03/02/20  Yes [name], FNP  latanoprost (XALATAN) 0.005 % ophthalmic solution Place 1 drop into both eyes at bedtime.    Yes [provider]  levothyroxine (SYNTHROID) 150 MCG tablet Take 1 tablet (150 mcg total) by mouth daily before breakfast. 09/07/20  Yes [name], FNP  linagliptin (TRADJENTA) 5 MG TABS tablet Take 1 tablet (5 mg total) by mouth daily. 02/03/20  Yes [name], FNP  loratadine (CLARITIN) 10 MG tablet Take 10 mg by mouth daily.   Yes [provider]  Methoxy PEG-Epoetin Beta (MIRCERA IJ) Mircera 11/02/20 11/01/21 Yes [provider]  Multiple Vitamins-Minerals (CENTRUM SILVER 50+WOMEN) TABS Take 1 tablet by mouth daily.   Yes [provider]  omeprazole (PRILOSEC) 40 MG capsule TAKE 1 CAPSULE BY MOUTH TWICE DAILY BEFORE A MEAL Patient taking differently: Take 40 mg by mouth daily. 11/15/20  Yes [name], FNP  polyethylene glycol (MIRALAX / GLYCOLAX) 17 g  packet Take 17 g by mouth daily. 10/16/20  Yes Barb Merino, MD  sildenafil (REVATIO) 20 MG tablet Take 1 tablet (20 mg total) by mouth 3 (three) times daily. 09/06/20  Yes Bensimhon, Shaune Pascal, MD  sodium chloride (OCEAN) 0.65 % nasal spray Place 1 spray into the nose in the morning, at noon, in the evening, and at bedtime.   Yes [provider]  furosemide (LASIX) 40 MG tablet Take 1 tablet (40 mg total) by mouth daily. Take 80 mg in AM and 40 mg in PM (4PM) Patient not taking: Reported on 11/21/2020 07/09/20   Debbe Odea, MD  traZODone (DESYREL) 50 MG tablet Take 0.5 tablets (25 mg total) by mouth at bedtime. Patient not taking: Reported on 11/21/2020 11/03/20  [name], MD    Inpatient Medications: Scheduled Meds: . allopurinol  300 mg Oral Daily  . amiodarone  200 mg Oral Daily  . atorvastatin  40 mg Oral Daily  . carvedilol  6.25 mg Oral BID  . Chlorhexidine Gluconate Cloth  6 each Topical Daily  . cloNIDine  0.1 mg Oral BID  . fluticasone  1 spray Each Nare Daily  . heparin  5,000 Units Subcutaneous Q8H  . hydrALAZINE  100 mg Oral TID  . latanoprost  1 drop Both Eyes QHS  . levothyroxine  150 mcg Oral Q0600  . linagliptin  5 mg Oral Daily  . loratadine  10 mg Oral Daily  . multivitamin with minerals  1 tablet Oral Daily  . pantoprazole  40 mg Oral BID  . polyethylene glycol  17 g Oral Daily  . sildenafil  20 mg Oral TID   Continuous Infusions:  PRN Meds: acetaminophen, ALPRAZolam, diphenhydrAMINE, sodium chloride, zolpidem  Allergies:    Allergies  Allergen Reactions  . Ancef [Cefazolin] Itching    Severe itching- after procedure, ancef was the antibiotic.-04/02/17 Tolerates penicillins  . Lactose Intolerance (Gi) Diarrhea  . Sulfa Antibiotics Rash and Other (See Comments)    Blisters, also    Social History:   Social History   Socioeconomic History  . Marital status: Married    Spouse name: Not on file  . Number of children: Not on file  .  Years of education: Not on file  . Highest education level: Not on file  Occupational History  . Occupation: retired  Tobacco Use  . Smoking status: Former Smoker    Packs/day: 0.25    Years: 20.00    Pack years: 5.00    Types: Cigarettes    Quit date: 1997    Years since quitting: 25.3  . Smokeless tobacco: Former Network engineer  . Vaping Use: Never used  Substance and Sexual Activity  . Alcohol use: Yes    Alcohol/week: 2.0 standard drinks    Types: 2 Shots of liquor per week    Comment: occ  . Drug use: Never  . Sexual activity: Not on file  Other Topics Concern  . Not on file  Social History Narrative  . Not on file   Social Determinants of Health   Financial Resource Strain: Medium Risk  . Difficulty of Paying Living Expenses: Somewhat hard  Food Insecurity: Not on file  Transportation Needs: Not on file  Physical Activity: Not on file  Stress: Not on file  Social Connections: Not on file  Intimate Partner Violence: Not on file    Family History:    Family History  Problem Relation Age of Onset  . Hypertension Mother   . Diabetes Mother   . Cervical cancer Mother   . Heart attack Father   . Hypertension Sister   . Hypertension Brother   . Hypertension Sister   . Hypertension Sister   . Prostate cancer Brother   . HIV/AIDS Brother      ROS:  Please see the history of present illness.   All other ROS reviewed and negative.     Physical Exam/Data:   Vitals:   11/21/20 1618 11/21/20 1646 11/21/20 1829 11/22/20 0503  BP: (!) 192/53 (!) 174/49 (!) 174/48 (!) 166/43  Pulse: 77 77 77 70  Resp: 18   20  Temp: 98.7 F (37.1 C)  99.3 F (37.4 C) 98.9 F (37.2 C)  TempSrc: Oral  Oral  Oral  SpO2: 100% 100% 94% 92%  Weight: 92.4 kg     Height: _0  (1.626 m)       Intake/Output Summary (Last 24 hours) at 11/22/2020 1137 Last data filed at 11/22/2020 0923 Gross per 24 hour  Intake 1052.5 ml  Output --  Net 1052.5 ml   Last 3 Weights 11/21/2020  11/21/2020 11/20/2020  Weight (lbs) 203 lb 11.2 oz 200 lb 9.9 oz 201 lb  Weight (kg) 92.398 kg 91 kg 91.173 kg     Body mass index is 34.97 kg/m.  General:  Very pleasant AA female in NAD HEENT: normal Neck: no JVD Cardiac:  RRR; 5/6 holosystolic murmur, no S2 Lungs:  clear to auscultation bilaterally, no wheezing, rhonchi or rales  Abd: soft, nontender, no hepatomegaly  Ext: minimal B LE edema Musculoskeletal:  No deformities, BUE and BLE strength normal and equal Skin: warm and dry  Neuro:  CNs 2-12 intact, no focal abnormalities noted Psych:  Normal affect   EKG:  The EKG was personally reviewed and demonstrates:  SR HR 95, ST depression lateral leads Telemetry:  Telemetry was personally reviewed and demonstrates:  Sinus rhythm in the 70s  Relevant CV Studies:  Right and left heart cath 10/13/20:  Mid RCA lesion is 20% stenosed.  Mid LM lesion is 60% stenosed.  Ost Cx to Prox Cx lesion is 30% stenosed.  Mid LAD lesion is 30% stenosed.   Findings:  Ao = 152/38 (76) LV =  186/25 RA =  6 RV = 76/11 PA = 74/19 (43) PCW = unable to obtain Fick cardiac output/index = 9.1/4.6 PVR = 2.0 (using LVEDP) Ao sat = 96% PA sat = 67%. 67%  AoV: Crossed easily with JR4 and J-wire. Peak-to-peak gradient 78mHg  Mean gradient 361mG AVA 1.4cm2 Assessment:  1. Significant CAD with 60% mid LM disease. O/w non-obstructive CAD 2. EF 60% 3. Moderate to severe pulmonary HTN due to high output 4. Moderate to severe AS  Plan/Discussion:  Results d/w TAVR team. Given unfavorable anatomy, burden of comorbidites and need for alternative access, I suspect risks of TAVR outweigh possible benefits. Favor medical management.    Echo 10/09/20: 1. Left ventricular ejection fraction, by estimation, is 50 to 55%. The  left ventricle has low normal function. The left ventricle has no regional  wall motion abnormalities. The left ventricular internal cavity size was  mildly dilated. Left  ventricular  diastolic parameters are indeterminate.  2. Right ventricular systolic function is mildly reduced. The right  ventricular size is normal. There is severely elevated pulmonary artery  systolic pressure. The estimated right ventricular systolic pressure is  8970.1mHg.  3. Left atrial size was mildly dilated.  4. Right atrial size was mildly dilated.  5. The mitral valve is degenerative. Trivial mitral valve regurgitation.  Moderate mitral stenosis. The mean mitral valve gradient is 7.0 mmHg with  MVA 1.5 cm^2 by VTI. Moderate to severe mitral annular calcification.  6. The aortic valve is tricuspid. Aortic valve regurgitation is mild.  Severe aortic valve stenosis. Aortic valve area, by VTI measures 0.71 cm.  Aortic valve mean gradient measures 65.0 mmHg.  7. The inferior vena cava is dilated in size with >50% respiratory  variability, suggesting right atrial pressure of 8 mmHg.  8. The patient was in atrial fibrillation.   Laboratory Data:  High Sensitivity Troponin:   Recent Labs  Lab 10/27/20 1842 11/21/20 1102 11/21/20 1302 11/21/20 1942 11/21/20 2158  TROPONINIHS 49* 23* 31*  79* 115*     Chemistry Recent Labs  Lab 11/21/20 1102 11/21/20 1108 11/21/20 1302 11/22/20 1017  NA 132* 130*  --  131*  K 4.4 4.5  --  5.8*  CL 95* 96*  --  97*  CO2 22  --   --  20*  GLUCOSE 161* 160*  --  230*  BUN 54* 56*  --  62*  CREATININE 2.59* 2.70* 2.53* 3.11*  CALCIUM 9.6  --   --  9.2  GFRNONAA 20*  --  20* 16*  ANIONGAP 15  --   --  14    Recent Labs  Lab 11/21/20 1102 11/22/20 1017  PROT 7.3  --   ALBUMIN 3.8 3.2*  AST 59*  --   ALT 35  --   ALKPHOS 160*  --   BILITOT 1.1  --    Hematology Recent Labs  Lab 11/21/20 1102 11/21/20 1108  WBC 5.0  --   RBC 2.31*  --   HGB 7.3* 7.5*  HCT 20.7* 22.0*  MCV 89.6  --   MCH 31.6  --   MCHC 35.3  --   RDW 21.1*  --   PLT 223  --    BNP Recent Labs  Lab 11/21/20 1102  BNP 1,054.4*    DDimer  No results for input(s): DDIMER in the last 168 hours.   Radiology/Studies:  DG Chest Port 1 View  Result Date: 11/21/2020 CLINICAL DATA:  65 year old female with chest pain. Dialysis patient. EXAM: PORTABLE CHEST 1 VIEW COMPARISON:  Portable chest 10/09/2020. Chest CTA 10/12/2020, and earlier. FINDINGS: Portable AP semi upright view at 1120 hours. Right chest dual lumen dialysis type catheter now in place. Stable lung volumes and mediastinal contours with mild cardiomegaly. Visualized tracheal air column is within normal limits. Stable pulmonary vascularity over this series of exams. No pneumothorax, pleural effusion or confluent pulmonary opacity. No acute osseous abnormality identified. IMPRESSION: Stable cardiomegaly and vascular congestion without overt edema. Electronically Signed   By: Genevie Ann M.D.   On: 11/21/2020 11:27     Assessment and Plan:   Chest pain - HS troponin 23 --> 31 --> 79 --> 115 - EKG with ST depression lateral leads - CAD with 60% stenosis in LM - has occurred in the past with profound anemia, Hb now 7.3 --> transfusion -->  7.5 - this is her baseline - chest pain completely resolved with transfusion - she was due for blood draw +/- scheduled transfusion this Thursday  - suspect she does not have reserves for mild insult   CAD - 60% left main - no plans for intervention or additional ischemic    Hypertension - she was hypertensive on arrival with wide pulse pressure 186/52 - home medications continued: clonidine, hydralazine, coreg,    PAF - on amiodarone - no AC given anemia - maintaining sinus rhythm   Chronic diastolic heart failure Pulmonary hypertension - continue sildenafil   ESRD on HD - missed HD yesterday - per nephrology   Acute respiratory failure - per RN, pt dropping to low 80s on room air - pt has no complaints, no change in symptoms when [place] removed - CXR reviewed, unclear etiology - mild hypervolemia will improve with  HD    Risk Assessment/Risk Scores:   HEAR Score (for undifferentiated chest pain):  HEAR Score: 5{  New York Heart Association (NYHA) Functional Class NYHA Class I   For questions or updates, please contact [facility] Please consult [email or web address]  for contact info under    Signed, Ledora Bottcher, PA  11/22/2020 11:37 AM As above, patient seen and examined.  Briefly she is a 65 year old female with past medical history of paroxysmal atrial fibrillation not on anticoagulation due to anemia, severe aortic stenosis, moderate mitral stenosis, pulmonary hypertension, diabetes mellitus, hypertension, end-stage renal disease dialysis dependent, history of colon cancer, myelodysplastic syndrome requiring intermittent blood transfusions for evaluation of chest pain.  Patient has severe aortic stenosis and is being evaluated for TAVR.  She recently saw Dr. Burt Knack and apparently given that she was asymptomatic unless she was anemic he felt that continued observation was appropriate.  Cardiac catheterization April 2022 showed 60% left main and otherwise nonobstructive disease.  This is treated medically.  Patient typically does not have dyspnea on exertion, orthopnea, PND, pedal edema, exertional chest pain or syncope.  She does develop chest pain when her hemoglobin drops.  Patient states she developed left upper chest and arm pain yesterday while driving to dialysis.  She presented to the emergency room and as she was being transfused her chest pain resolved.  She is presently pain-free.  Cardiology now asked to evaluate.  Chest x-ray shows vascular congestion.  Troponin 23, 31, 79 and 115.  BNP 1054.  Initial hemoglobin 7.3.  Initial electrocardiogram showed sinus rhythm with mild ST depression in the lateral leads.  Follow-up showed resolution.  1 chest pain-patient has known coronary disease, severe aortic stenosis and develops chest discomfort when her hemoglobin drops (patient with known  myelodysplastic syndrome).  Her chest pain resolved with transfusion.  She had a recent cardiac catheterization and medical therapy planned.  We will not pursue further ischemia evaluation.  Need to maintain hemoglobin of at least 7.5 and avoid hypoxia to keep pain-free.  2 hypoxia-etiology unclear.  There are some vascular congestion on chest x-ray.  She may need volume removal with dialysis and if hypoxia persists evaluation for pulmonary embolus.  We will leave to primary care.  3 severe aortic stenosis-patient has been evaluated by Dr. Burt Knack for TAVR.  At present he is following as she has been asymptomatic when her hemoglobin improves.  4 myelodysplastic syndrome-she requires transfusions approximately 3 times weekly.  5 end-stage renal disease-she will need dialysis per nephrology.  6 paroxysmal atrial fibrillation-continue amiodarone.  Patient remains in sinus rhythm.  She is not on anticoagulation given history of recurrent anemia.  7 pulmonary hypertension-continue sildenafil.  Would continue preadmission medications at discharge.  Cardiology will sign off.  She will follow-up with Dr. Burt Knack as scheduled.  Kirk Ruths, MD

## 2020-11-22 NOTE — Progress Notes (Signed)
Called by RN that troponin level elevated.  Pt is asymptomatic. No chest pain, pressure or palpitations. No abdominal pain.  EKG obtained and shows NSR with first degree block and prolonged QT zofran stopped Pt is ESRD on HD. Missed dialysis yesterday per RN and planning to dialysis on 11/22/20. Continue to monitor.

## 2020-11-22 NOTE — Progress Notes (Signed)
Pt to be transferred to [facility] from [facility] for dialysis today Missed dialysis on 5/10  Outpatient dialysis orders: [facility] TTS 4h 400/500 EDW 90kg 3K/2Ca  Access: TDC  Meds: Mircera 75 q 2 wks Calcitriol 0.25 TIW    Lynnda Child PA-C [facility] 11/22/2020,1:55 PM

## 2020-11-22 NOTE — Procedures (Signed)
Seen and examined on dialysis.  Procedure supervised.  Blood pressure 183/53 and HR 77.  RIJ catheter in use.  Tolerating goal.  No dizziness or cramping.  If able to come off of oxygen comfortably she is able to be discharged after HD today.  Feels well.  No chest pain and no shortness of breath.   She is normally TTS at [facility] and if she stays overnight she would be able to dialyze there tomorrow from my standpoint at this time.    Claudia Desanctis, MD 11/22/2020 4:45 PM

## 2020-11-22 NOTE — Progress Notes (Signed)
PROGRESS NOTE    Patricia Mata  ZOX:[id number] DOB: [birth year] DOA: 11/21/2020 PCP: [name], FNP   Brief Narrative:   65 y.o. female with medical history significant of ESRD, HTN, HLD, anemia of CKD, a. Fib, type 2 diabetes with nephropathy, GERD, hypothyroidism, pulmonary HTN, non-obstructive CAD, chronic diastolic HF, aortic stenosis and gout, who presented to ED with complaints of CP. Mid center, radiated to the left, 7-8/10 in intensity, no associated or alleviating factors reported. Patient expressed recent admission due to chest pain that triggered need for heart cath.  Patient was seen by cardiology and nephrology team.  Cardiology recommended no further work-up at this time, medical management and keeping hemoglobin greater than 7.5 and transfuse as necessary.  Due to hypoxia and missing outpatient dialysis she will be transferred to [facility] for hemodialysis.   Assessment & Plan:   Principal Problem:   Hypoxia Active Problems:   Aortic stenosis, severe   Carotid artery disease (HCC)   PAF (paroxysmal atrial fibrillation) (HCC)   DM2 (diabetes mellitus, type 2) (HCC)   Chest pain   ESRD (end stage renal disease) ([place])  Acute respiratory distress with hypoxia, desaturated down to 82% on room air ESRD on hemodialysis -Currently patient is requiring 2-3 L nasal cannula.  Patient has missed outpatient hemodialysis on 5/10.  Will need to transfer patient to [facility] for hemodialysis.  Discussed with nephrology, Dr. Royce Macadamia.  Wean off oxygen as appropriate.  Chest x-ray shows some vascular congestion.  Atypical chest pain, mostly chronic Elevated troponin -Recently had cardiac cath-showed EF 60%, 60% mid left main disease.  Seen by cardiology team.  No further cardiac work-up at this time besides medical management and monitoring hemoglobin  Chronic congestive heart failure with preserved ejection fraction Severe aortic stenosis -Previously  determined by TAVR team that patient does not have favorable anatomy therefore medical management at this time.  Anemia of chronic disease - No obvious signs of bleeding.  Plans to keep hemoglobin greater than 7.5.  Status post 1 unit PRBC transfusion.  Repeat CBC.  Hyperlipidemia - Continue Lipitor 40 mg daily  Diabetes mellitus type 2 - Sliding scale and Accu-Chek - Continue Tradjenta  Hypothyroidism - Continue Synthroid  GERD - PPI  Pulmonary hypertension - Continue sildenafil  Atrial fibrillation, paroxysmal - On amiodarone.  Not on anticoagulation due to anemia      DVT prophylaxis: heparin injection 5,000 Units Start: 11/21/20 2200 Code Status: Full code Family Communication:      Dispo: The patient is from: Home              Anticipated d/c is to: Home              Patient currently is not medically stable to d/c.  Patient is being transferred to [facility] for hemodialysis due to hypoxia on room air.  She does not use any oxygen at home.   Difficult to place patient No      Subjective: Patient's states her chest pain is little better this morning but upon taking her off oxygen on room air she is desaturates down to 82%.  Review of Systems Otherwise negative except as per HPI, including: General: Denies fever, chills, night sweats or unintended weight loss. Resp: Denies cough, wheezing, shortness of breath. Cardiac: Denies chest pain, palpitations, orthopnea, paroxysmal nocturnal dyspnea. GI: Denies abdominal pain, nausea, vomiting, diarrhea or constipation GU: Denies dysuria, frequency, hesitancy or incontinence MS: Denies muscle aches, joint pain or  swelling Neuro: Denies headache, neurologic deficits (focal weakness, numbness, tingling), abnormal gait Psych: Denies anxiety, depression, SI/HI/AVH Skin: Denies new rashes or lesions ID: Denies sick contacts, exotic exposures, travel  Examination:  General exam: Appears calm and comfortable,  2 L nasal cannula Respiratory system: Bibasilar crackles Cardiovascular system: S1 & S2 heard, RRR. No JVD, murmurs, rubs, gallops or clicks. No pedal edema. Gastrointestinal system: Abdomen is nondistended, soft and nontender. No organomegaly or masses felt. Normal bowel sounds heard. Central nervous system: Alert and oriented. No focal neurological deficits. Extremities: Symmetric 5 x 5 power. Skin: No rashes, lesions or ulcers Psychiatry: Judgement and insight appear normal. Mood & affect appropriate.  HD site noted  Objective: Vitals:   11/21/20 1618 11/21/20 1646 11/21/20 1829 11/22/20 0503  BP: (!) 192/53 (!) 174/49 (!) 174/48 (!) 166/43  Pulse: 77 77 77 70  Resp: 18   20  Temp: 98.7 F (37.1 C)  99.3 F (37.4 C) 98.9 F (37.2 C)  TempSrc: Oral  Oral Oral  SpO2: 100% 100% 94% 92%  Weight: 92.4 kg     Height: 5\' 4"  (1.626 m)       Intake/Output Summary (Last 24 hours) at 11/22/2020 1202 Last data filed at 11/22/2020 7619 Gross per 24 hour  Intake 1052.5 ml  Output --  Net 1052.5 ml   Filed Weights   11/21/20 1054 11/21/20 1618  Weight: 91 kg 92.4 kg     Data Reviewed:   CBC: Recent Labs  Lab 11/21/20 1102 11/21/20 1108  WBC 5.0  --   NEUTROABS 3.0  --   HGB 7.3* 7.5*  HCT 20.7* 22.0*  MCV 89.6  --   PLT 223  --    Basic Metabolic Panel: Recent Labs  Lab 11/21/20 1102 11/21/20 1108 11/21/20 1302 11/22/20 1017  NA 132* 130*  --  131*  K 4.4 4.5  --  5.8*  CL 95* 96*  --  97*  CO2 22  --   --  20*  GLUCOSE 161* 160*  --  230*  BUN 54* 56*  --  62*  CREATININE 2.59* 2.70* 2.53* 3.11*  CALCIUM 9.6  --   --  9.2  PHOS  --   --   --  4.3   GFR: Estimated Creatinine Clearance: 19.6 mL/min (A) (by C-G formula based on SCr of 3.11 mg/dL (H)). Liver Function Tests: Recent Labs  Lab 11/21/20 1102 11/22/20 1017  AST 59*  --   ALT 35  --   ALKPHOS 160*  --   BILITOT 1.1  --   PROT 7.3  --   ALBUMIN 3.8 3.2*   Recent Labs  Lab 11/21/20 1102   LIPASE 32   No results for input(s): AMMONIA in the last 168 hours. Coagulation Profile: Recent Labs  Lab 11/21/20 1102  INR 1.0   Cardiac Enzymes: No results for input(s): CKTOTAL, CKMB, CKMBINDEX, TROPONINI in the last 168 hours. BNP (last 3 results) No results for input(s): PROBNP in the last 8760 hours. HbA1C: No results for input(s): HGBA1C in the last 72 hours. CBG: No results for input(s): GLUCAP in the last 168 hours. Lipid Profile: Recent Labs    11/21/20 1302  CHOL 176  HDL 88  LDLCALC 71  TRIG 85  CHOLHDL 2.0   Thyroid Function Tests: No results for input(s): TSH, T4TOTAL, FREET4, T3FREE, THYROIDAB in the last 72 hours. Anemia Panel: No results for input(s): VITAMINB12, FOLATE, FERRITIN, TIBC, IRON, RETICCTPCT in the last 72 hours.  Sepsis Labs: Recent Labs  Lab 11/21/20 1102 11/21/20 1302  LATICACIDVEN 2.8* 1.9    Recent Results (from the past 240 hour(s))  Resp Panel by RT-PCR (Flu A&B, Covid) Nasopharyngeal Swab     Status: None   Collection Time: 11/21/20  3:13 PM   Specimen: Nasopharyngeal Swab; Nasopharyngeal(NP) swabs in vial transport medium  Result Value Ref Range Status   SARS Coronavirus 2 by RT PCR NEGATIVE NEGATIVE Final    Comment: (NOTE) SARS-CoV-2 target nucleic acids are NOT DETECTED.  The SARS-CoV-2 RNA is generally detectable in upper respiratory specimens during the acute phase of infection. The lowest concentration of SARS-CoV-2 viral copies this assay can detect is 138 copies/mL. A negative result does not preclude SARS-Cov-2 infection and should not be used as the sole basis for treatment or other patient management decisions. A negative result may occur with  improper specimen collection/handling, submission of specimen other than nasopharyngeal swab, presence of viral mutation(s) within the areas targeted by this assay, and inadequate number of viral copies(<138 copies/mL). A negative result must be combined with clinical  observations, patient history, and epidemiological information. The expected result is Negative.  Fact Sheet for Patients:  EntrepreneurPulse.com.au  Fact Sheet for Healthcare Providers:  IncredibleEmployment.be  This test is no t yet approved or cleared by the [place] FDA and  has been authorized for detection and/or diagnosis of SARS-CoV-2 by FDA under an Emergency Use Authorization (EUA). This EUA will remain  in effect (meaning this test can be used) for the duration of the COVID-19 declaration under Section 564(b)(1) of the Act, 21 U.S.C.section 360bbb-3(b)(1), unless the authorization is terminated  or revoked sooner.       Influenza A by PCR NEGATIVE NEGATIVE Final   Influenza B by PCR NEGATIVE NEGATIVE Final    Comment: (NOTE) The Xpert Xpress SARS-CoV-2/FLU/RSV plus assay is intended as an aid in the diagnosis of influenza from Nasopharyngeal swab specimens and should not be used as a sole basis for treatment. Nasal washings and aspirates are unacceptable for Xpert Xpress SARS-CoV-2/FLU/RSV testing.  Fact Sheet for Patients: EntrepreneurPulse.com.au  Fact Sheet for Healthcare Providers: IncredibleEmployment.be  This test is not yet approved or cleared by the [place] FDA and has been authorized for detection and/or diagnosis of SARS-CoV-2 by FDA under an Emergency Use Authorization (EUA). This EUA will remain in effect (meaning this test can be used) for the duration of the COVID-19 declaration under Section 564(b)(1) of the Act, 21 U.S.C. section 360bbb-3(b)(1), unless the authorization is terminated or revoked.  Performed at [facility], [place] [address] [place],  [address]          Radiology Studies: [facility] Chest Port 1 View  Result Date: 11/21/2020 CLINICAL DATA:  65 year old female with chest pain. Dialysis patient. EXAM: PORTABLE CHEST 1 VIEW  COMPARISON:  Portable chest 10/09/2020. Chest CTA 10/12/2020, and earlier. FINDINGS: Portable AP semi upright view at 1120 hours. Right chest dual lumen dialysis type catheter now in place. Stable lung volumes and mediastinal contours with mild cardiomegaly. Visualized tracheal air column is within normal limits. Stable pulmonary vascularity over this series of exams. No pneumothorax, pleural effusion or confluent pulmonary opacity. No acute osseous abnormality identified. IMPRESSION: Stable cardiomegaly and vascular congestion without overt edema. Electronically Signed   By: Genevie Ann M.D.   On: 11/21/2020 11:27        Scheduled Meds: . allopurinol  300 mg Oral Daily  . amiodarone  200 mg Oral Daily  .  atorvastatin  40 mg Oral Daily  . carvedilol  6.25 mg Oral BID  . Chlorhexidine Gluconate Cloth  6 each Topical Daily  . [START ON 11/23/2020] Chlorhexidine Gluconate Cloth  6 each Topical Q0600  . cloNIDine  0.1 mg Oral BID  . fluticasone  1 spray Each Nare Daily  . heparin  5,000 Units Subcutaneous Q8H  . hydrALAZINE  100 mg Oral TID  . latanoprost  1 drop Both Eyes QHS  . levothyroxine  150 mcg Oral Q0600  . linagliptin  5 mg Oral Daily  . loratadine  10 mg Oral Daily  . multivitamin with minerals  1 tablet Oral Daily  . pantoprazole  40 mg Oral BID  . polyethylene glycol  17 g Oral Daily  . sildenafil  20 mg Oral TID   Continuous Infusions:   LOS: 0 days   Time spent= 35 mins    Raeann Offner Arsenio Loader, MD [facility]  If 7PM-7AM, please contact night-coverage  11/22/2020, 12:02 PM

## 2020-11-22 NOTE — Progress Notes (Signed)
Renal Navigator discussed with Nephrologist and patient and will follow up with Attending in the morning.  If patient is able to be discharged Thursday, 11/23/20, she can go to her regularly schedule HD treatment at [facility]. Her seat time is 12:40pm and, therefore, needs to arrive at 12:20pm. She is agreeable to this and states her son or husband will be able to transport.  Navigator will follow up in the morning.   Alphonzo Cruise, [place] Renal Navigator [phone]

## 2020-11-23 ENCOUNTER — Telehealth: Payer: Self-pay | Admitting: Oncology

## 2020-11-23 ENCOUNTER — Inpatient Hospital Stay: Payer: Medicare Other

## 2020-11-23 ENCOUNTER — Telehealth: Payer: Self-pay | Admitting: *Deleted

## 2020-11-23 ENCOUNTER — Encounter: Payer: Self-pay | Admitting: Physician Assistant

## 2020-11-23 DIAGNOSIS — M1991 Primary osteoarthritis, unspecified site: Secondary | ICD-10-CM | POA: Insufficient documentation

## 2020-11-23 DIAGNOSIS — E669 Obesity, unspecified: Secondary | ICD-10-CM | POA: Insufficient documentation

## 2020-11-23 DIAGNOSIS — Z79899 Other long term (current) drug therapy: Secondary | ICD-10-CM | POA: Insufficient documentation

## 2020-11-23 DIAGNOSIS — R0902 Hypoxemia: Secondary | ICD-10-CM | POA: Diagnosis not present

## 2020-11-23 DIAGNOSIS — M2550 Pain in unspecified joint: Secondary | ICD-10-CM | POA: Insufficient documentation

## 2020-11-23 NOTE — Telephone Encounter (Signed)
Scheduled appts per 5/12 sch msg. Called pt, no answer. Left msg with appts date and times.  

## 2020-11-23 NOTE — Progress Notes (Signed)
Renal Navigator appreciates call from Dr. Royce Macadamia stating that she and team agree that patient is cleared for discharge this morning. Navigator notes that patient has a discharge order in the computer.  Patient will not have HD in the hospital today prior to discharge.  Patient will have her HD treatment at her home clinic/[facility] at her normal seat time of 12:40pm. She needs to arrive at 12:20pm. Navigator spoke with Renal PA/Grace E. who will send orders. Navigator called [facility] to inform that patient will be there for tx today. Navigator spoke with patient, who continues to be in agreement of this plan and is eager to discharge this morning as soon as she is able. She is hopeful that she will be able to go home and change clothes before going to her treatment.  Navigator spoke with Surveyor, minerals to update.  Alphonzo Cruise, [place] Renal Navigator [phone]

## 2020-11-23 NOTE — Telephone Encounter (Signed)
Patient called. She is being discharged from hospital today and needs to cancel today's appts (lab & transfusion/infusion). She wants to reschedule. Dr. Alen Blew informed. Per Dr. Alen Blew, schedule for Lab + 5 hr blood on 5/27. Schedule message sent. Contacted patient - and left voice mail on named voice mail to inform her of Dr. Hazeline Junker plan as above and that scheduler will contact her to schedule appts.

## 2020-11-23 NOTE — Discharge Summary (Signed)
Physician Discharge Summary  Patricia Mata IRW:[id number] DOB: Nov 27, 1955 DOA: 11/21/2020  PCP: [name], FNP  Admit date: 11/21/2020 Discharge date: 11/23/2020  Admitted From: Home Disposition: Home  Recommendations for Outpatient Follow-up:  1. Follow up with PCP in 1-2 weeks 2. Follow-up with nephrology as scheduled 3. Please obtain CBC in one week  Home Health: No Equipment/Devices: None  Discharge Condition: Stable CODE STATUS: Full code Diet recommendation: Renal diet  History of present illness:  Patricia Mata is a 65 y.o.femalewith medical history significant ofESRD on HD TTS, HTN, HLD, anemia of CKD, a. Fib, type 2 diabetes with nephropathy, GERD, hypothyroidism, pulmonary HTN, non-obstructive CAD, chronic diastolic HF, aortic stenosis and gout, who presented to ED with complaints of chest pain. Symptoms localized to mid center chest with radiation to the left, 7-8/10 in intensity, no associated or alleviating factors reported. Patient expressed recent admission due to chest pain that triggered need for heart cath.  Patient was seen by cardiology and nephrology team.  Cardiology recommended no further work-up at this time, medical management and keeping hemoglobin greater than 7.5 and transfuse as necessary.  Due to hypoxia and missing outpatient dialysis she was be transferred to [facility] for hemodialysis.  Hospital service consulted for further evaluation and management.  Hospital course:  Acute respiratory distress with hypoxia, desaturated down to 82% on room air ESRD on hemodialysis Patient presenting with shortness of breath, required 2-3 L nasal cannula to maintain SPO2.  Chest x-ray notable for vascular congestion.  Etiology secondary to missed outpatient hemodialysis with volume overload.  Patient was transferred to [facility] for hemodialysis. Following HD, patient was weaned off of supple oxygen with appropriate O2 saturation.  Patient  will be discharged today to continue her normal hemodialysis on a Tuesday/Thursday/Saturday schedule, planned for further HD today at her home unit at 12:20 PM.  Atypical chest pain, mostly chronic Elevated troponinRecently had cardiac cath-showed EF 60%, 60% mid left main disease.  Seen by cardiology team.  No further cardiac work-up at this time besides medical management and monitoring hemoglobin  Chronic congestive heart failure with preserved ejection fraction Severe aortic stenosis Previously determined by TAVR team that patient does not have favorable anatomy therefore medical management at this time.  Anemia of chronic disease Transfuse 1 unit PRBC.  No obvious signs of bleeding.  Plans to keep hemoglobin greater than 7.5.    Hemoglobin 7.6 at time of discharge.  Recommend repeat CBC 1 week.  Hyperlipidemia Continue Lipitor 40 mg daily  Diabetes mellitus type 2 Continue Tradjenta  Hypothyroidism Continue Synthroid  GERD PPI  Pulmonary hypertension Continue sildenafil  Atrial fibrillation, paroxysmal On amiodarone.  Not on anticoagulation due to anemia  Discharge Diagnoses:  Active Problems:   Aortic stenosis, severe   Carotid artery disease (HCC)   PAF (paroxysmal atrial fibrillation) (HCC)   DM2 (diabetes mellitus, type 2) ([place])   ESRD (end stage renal disease) ([place])    Discharge Instructions  Discharge Instructions    Call MD for:  difficulty breathing, headache or visual disturbances   Complete by: As directed    Call MD for:  extreme fatigue   Complete by: As directed    Call MD for:  persistant dizziness or light-headedness   Complete by: As directed    Call MD for:  persistant nausea and vomiting   Complete by: As directed    Call MD for:  severe uncontrolled pain   Complete by: As directed    Call  MD for:  temperature >100.4   Complete by: As directed    Diet - low sodium heart healthy   Complete by: As directed    Increase activity  slowly   Complete by: As directed      Allergies as of 11/23/2020      Reactions   Ancef [cefazolin] Itching   Severe itching- after procedure, ancef was the antibiotic.-04/02/17 Tolerates penicillins   Lactose Intolerance (gi) Diarrhea   Sulfa Antibiotics Rash, Other (See Comments)   Blisters, also      Medication List    TAKE these medications   allopurinol 300 MG tablet Commonly known as: ZYLOPRIM Take 300 mg by mouth daily.   amiodarone 200 MG tablet Commonly known as: PACERONE Take 1 tablet (200 mg total) by mouth daily.   atorvastatin 40 MG tablet Commonly known as: LIPITOR Take 1 tablet by mouth once daily   Basaglar KwikPen 100 UNIT/ML Inject 15 Units into the skin daily. Only if above 150   blood glucose meter kit and supplies Dispense based on patient and insurance preference. Use up to four times daily as directed. (FOR ICD-10 E10.9, E11.9). What changed:   how much to take  how to take this  when to take this   carvedilol 6.25 MG tablet Commonly known as: COREG Take 6.25 mg by mouth 2 (two) times daily.   Centrum Silver 50+Women Tabs Take 1 tablet by mouth daily.   cloNIDine 0.1 MG tablet Commonly known as: Catapres Take 1 tablet (0.1 mg total) by mouth 2 (two) times daily.   colchicine 0.6 MG tablet Take 0.6 mg by mouth daily as needed (gout).   fluticasone 50 MCG/ACT nasal spray Commonly known as: FLONASE Place 1 spray into both nostrils daily.   furosemide 40 MG tablet Commonly known as: LASIX Take 1 tablet (40 mg total) by mouth daily. Take 80 mg in AM and 40 mg in PM (4PM)   glucose blood test strip check blood sugars twice daily Dx code E11.22 What changed:   how much to take  how to take this  when to take this   hydrALAZINE 100 MG tablet Commonly known as: APRESOLINE Take 1 tablet (100 mg total) by mouth 3 (three) times daily.   Insulin Pen Needle 29G X 5MM Misc Use as directed What changed:   how much to take  how  to take this  when to take this  additional instructions   latanoprost 0.005 % ophthalmic solution Commonly known as: XALATAN Place 1 drop into both eyes at bedtime.   levothyroxine 150 MCG tablet Commonly known as: SYNTHROID Take 1 tablet (150 mcg total) by mouth daily before breakfast.   linagliptin 5 MG Tabs tablet Commonly known as: Tradjenta Take 1 tablet (5 mg total) by mouth daily.   loratadine 10 MG tablet Commonly known as: CLARITIN Take 10 mg by mouth daily.   MIRCERA IJ Mircera   omeprazole 40 MG capsule Commonly known as: PRILOSEC TAKE 1 CAPSULE BY MOUTH TWICE DAILY BEFORE A MEAL What changed: See the new instructions.   OneTouch Delica Plus [id number] Misc check blood sugars twice daily Dx code: E11.22 What changed:   how much to take  how to take this  when to take this   OneTouch UltraLink w/Device Kit Check blood sugars twice daily E11.9 What changed:   how much to take  how to take this  when to take this   polyethylene glycol 17 g packet Commonly known as:  MIRALAX / GLYCOLAX Take 17 g by mouth daily.   sildenafil 20 MG tablet Commonly known as: REVATIO Take 1 tablet (20 mg total) by mouth 3 (three) times daily.   sodium chloride 0.65 % nasal spray Commonly known as: OCEAN Place 1 spray into the nose in the morning, at noon, in the evening, and at bedtime.   traZODone 50 MG tablet Commonly known as: DESYREL Take 0.5 tablets (25 mg total) by mouth at bedtime.       Follow-up Information    [name], FNP. Schedule an appointment as soon as possible for a visit in 1 week(s).   Specialty: General Practice Contact information: [address] [place] [address] [phone]        Lorretta Harp, MD .   Specialties: Cardiology, Radiology Contact information: [address] [place] [address] [phone]        Bensimhon, Shaune Pascal, MD .   Specialty: Cardiology Contact  information: [place] [address] [phone]              Allergies  Allergen Reactions  . Ancef [Cefazolin] Itching    Severe itching- after procedure, ancef was the antibiotic.-04/02/17 Tolerates penicillins  . Lactose Intolerance (Gi) Diarrhea  . Sulfa Antibiotics Rash and Other (See Comments)    Blisters, also    Consultations:  Nephrology  Cardiology   Procedures/Studies: DG Chest Port 1 View  Result Date: 11/21/2020 CLINICAL DATA:  65 year old female with chest pain. Dialysis patient. EXAM: PORTABLE CHEST 1 VIEW COMPARISON:  Portable chest 10/09/2020. Chest CTA 10/12/2020, and earlier. FINDINGS: Portable AP semi upright view at 1120 hours. Right chest dual lumen dialysis type catheter now in place. Stable lung volumes and mediastinal contours with mild cardiomegaly. Visualized tracheal air column is within normal limits. Stable pulmonary vascularity over this series of exams. No pneumothorax, pleural effusion or confluent pulmonary opacity. No acute osseous abnormality identified. IMPRESSION: Stable cardiomegaly and vascular congestion without overt edema. Electronically Signed   By: Genevie Ann M.D.   On: 11/21/2020 11:27      Subjective: Patient seen and examined bedside, resting comfortably.  Eating breakfast.  Denies any further chest pain or shortness of breath.  Oxygen weaned off.  Ready for discharge home with plan for further HD this afternoon.  Denies headache, no fever/chills/night sweats, no nausea cefonicid diarrhea, no chest pain, no palpitations, no shortness of breath, no cough/congestion, no abdominal pain, no weakness, no fatigue, no paresthesias.  No acute events overnight per nurse staff.  Discharge Exam: Vitals:   11/23/20 0436 11/23/20 0721  BP: (!) 129/42 (!) 157/51  Pulse: 65 61  Resp: 16 18  Temp: 98.8 F (37.1 C) 98.5 F (36.9 C)  SpO2: 100% 100%   Vitals:   11/22/20 2306 11/23/20 0005 11/23/20 0436  11/23/20 0721  BP: (!) 133/48 (!) 136/41 (!) 129/42 (!) 157/51  Pulse: 68 71 65 61  Resp:  '16 16 18  ' Temp:   98.8 F (37.1 C) 98.5 F (36.9 C)  TempSrc:   Oral Oral  SpO2: 98% 98% 100% 100%  Weight:   88.5 kg   Height:        General: Pt is alert, awake, not in acute distress Cardiovascular: RRR, S1/S2 +, no rubs, no gallops Respiratory: CTA bilaterally, no wheezing, no rhonchi, on room air Abdominal: Soft, NT, ND, bowel sounds + Extremities: no edema, no cyanosis    The results of significant  diagnostics from this hospitalization (including imaging, microbiology, ancillary and laboratory) are listed below for reference.     Microbiology: Recent Results (from the past 240 hour(s))  Resp Panel by RT-PCR (Flu A&B, Covid) Nasopharyngeal Swab     Status: None   Collection Time: 11/21/20  3:13 PM   Specimen: Nasopharyngeal Swab; Nasopharyngeal(NP) swabs in vial transport medium  Result Value Ref Range Status   SARS Coronavirus 2 by RT PCR NEGATIVE NEGATIVE Final    Comment: (NOTE) SARS-CoV-2 target nucleic acids are NOT DETECTED.  The SARS-CoV-2 RNA is generally detectable in upper respiratory specimens during the acute phase of infection. The lowest concentration of SARS-CoV-2 viral copies this assay can detect is 138 copies/mL. A negative result does not preclude SARS-Cov-2 infection and should not be used as the sole basis for treatment or other patient management decisions. A negative result may occur with  improper specimen collection/handling, submission of specimen other than nasopharyngeal swab, presence of viral mutation(s) within the areas targeted by this assay, and inadequate number of viral copies(<138 copies/mL). A negative result must be combined with clinical observations, patient history, and epidemiological information. The expected result is Negative.  Fact Sheet for Patients:  EntrepreneurPulse.com.au  Fact Sheet for Healthcare  Providers:  IncredibleEmployment.be  This test is no t yet approved or cleared by the [place] FDA and  has been authorized for detection and/or diagnosis of SARS-CoV-2 by FDA under an Emergency Use Authorization (EUA). This EUA will remain  in effect (meaning this test can be used) for the duration of the COVID-19 declaration under Section 564(b)(1) of the Act, 21 U.S.C.section 360bbb-3(b)(1), unless the authorization is terminated  or revoked sooner.       Influenza A by PCR NEGATIVE NEGATIVE Final   Influenza B by PCR NEGATIVE NEGATIVE Final    Comment: (NOTE) The Xpert Xpress SARS-CoV-2/FLU/RSV plus assay is intended as an aid in the diagnosis of influenza from Nasopharyngeal swab specimens and should not be used as a sole basis for treatment. Nasal washings and aspirates are unacceptable for Xpert Xpress SARS-CoV-2/FLU/RSV testing.  Fact Sheet for Patients: EntrepreneurPulse.com.au  Fact Sheet for Healthcare Providers: IncredibleEmployment.be  This test is not yet approved or cleared by the [place] FDA and has been authorized for detection and/or diagnosis of SARS-CoV-2 by FDA under an Emergency Use Authorization (EUA). This EUA will remain in effect (meaning this test can be used) for the duration of the COVID-19 declaration under Section 564(b)(1) of the Act, 21 U.S.C. section 360bbb-3(b)(1), unless the authorization is terminated or revoked.  Performed at [facility], [place] [address], [place], [place] [address]      Labs: BNP (last 3 results) Recent Labs    06/07/20 0826 07/06/20 1733 11/21/20 1102  BNP 489.2* 932.6* 9,233.0*   Basic Metabolic Panel: Recent Labs  Lab 11/21/20 1102 11/21/20 1108 11/21/20 1302 11/22/20 1017 11/22/20 1219  NA 132* 130*  --  131* 133*  K 4.4 4.5  --  5.8* 4.7  CL 95* 96*  --  97* 97*  CO2 22  --   --  20* 24  GLUCOSE 161* 160*  --  230*  175*  BUN 54* 56*  --  62* 67*  CREATININE 2.59* 2.70* 2.53* 3.11* 3.18*  CALCIUM 9.6  --   --  9.2 9.4  PHOS  --   --   --  4.3  --    Liver Function Tests: Recent Labs  Lab 11/21/20 1102 11/22/20 1017  AST 59*  --   ALT 35  --   ALKPHOS 160*  --   BILITOT 1.1  --   PROT 7.3  --   ALBUMIN 3.8 3.2*   Recent Labs  Lab 11/21/20 1102  LIPASE 32   No results for input(s): AMMONIA in the last 168 hours. CBC: Recent Labs  Lab 11/21/20 1102 11/21/20 1108 11/22/20 1219  WBC 5.0  --  5.8  NEUTROABS 3.0  --   --   HGB 7.3* 7.5* 7.6*  HCT 20.7* 22.0* 21.8*  MCV 89.6  --  91.2  PLT 223  --  190   Cardiac Enzymes: No results for input(s): CKTOTAL, CKMB, CKMBINDEX, TROPONINI in the last 168 hours. BNP: Invalid input(s): POCBNP CBG: No results for input(s): GLUCAP in the last 168 hours. D-Dimer No results for input(s): DDIMER in the last 72 hours. Hgb A1c No results for input(s): HGBA1C in the last 72 hours. Lipid Profile Recent Labs    11/21/20 1302  CHOL 176  HDL 88  LDLCALC 71  TRIG 85  CHOLHDL 2.0   Thyroid function studies No results for input(s): TSH, T4TOTAL, T3FREE, THYROIDAB in the last 72 hours.  Invalid input(s): FREET3 Anemia work up No results for input(s): VITAMINB12, FOLATE, FERRITIN, TIBC, IRON, RETICCTPCT in the last 72 hours. Urinalysis    Component Value Date/Time   COLORURINE AMBER (A) 10/09/2020 2320   APPEARANCEUR CLOUDY (A) 10/09/2020 2320   LABSPEC 1.016 10/09/2020 2320   PHURINE 5.0 10/09/2020 2320   GLUCOSEU NEGATIVE 10/09/2020 2320   HGBUR SMALL (A) 10/09/2020 2320   BILIRUBINUR NEGATIVE 10/09/2020 2320   BILIRUBINUR negative 05/11/2020 1152   KETONESUR NEGATIVE 10/09/2020 2320   PROTEINUR 100 (A) 10/09/2020 2320   UROBILINOGEN 0.2 05/11/2020 1152   NITRITE NEGATIVE 10/09/2020 2320   LEUKOCYTESUR MODERATE (A) 10/09/2020 2320   Sepsis Labs Invalid input(s): PROCALCITONIN,  WBC,  LACTICIDVEN Microbiology Recent Results (from  the past 240 hour(s))  Resp Panel by RT-PCR (Flu A&B, Covid) Nasopharyngeal Swab     Status: None   Collection Time: 11/21/20  3:13 PM   Specimen: Nasopharyngeal Swab; Nasopharyngeal(NP) swabs in vial transport medium  Result Value Ref Range Status   SARS Coronavirus 2 by RT PCR NEGATIVE NEGATIVE Final    Comment: (NOTE) SARS-CoV-2 target nucleic acids are NOT DETECTED.  The SARS-CoV-2 RNA is generally detectable in upper respiratory specimens during the acute phase of infection. The lowest concentration of SARS-CoV-2 viral copies this assay can detect is 138 copies/mL. A negative result does not preclude SARS-Cov-2 infection and should not be used as the sole basis for treatment or other patient management decisions. A negative result may occur with  improper specimen collection/handling, submission of specimen other than nasopharyngeal swab, presence of viral mutation(s) within the areas targeted by this assay, and inadequate number of viral copies(<138 copies/mL). A negative result must be combined with clinical observations, patient history, and epidemiological information. The expected result is Negative.  Fact Sheet for Patients:  EntrepreneurPulse.com.au  Fact Sheet for Healthcare Providers:  IncredibleEmployment.be  This test is no t yet approved or cleared by the [place] FDA and  has been authorized for detection and/or diagnosis of SARS-CoV-2 by FDA under an Emergency Use Authorization (EUA). This EUA will remain  in effect (meaning this test can be used) for the duration of the COVID-19 declaration under Section 564(b)(1) of the Act, 21 U.S.C.section 360bbb-3(b)(1), unless the authorization is terminated  or revoked sooner.  Influenza A by PCR NEGATIVE NEGATIVE Final   Influenza B by PCR NEGATIVE NEGATIVE Final    Comment: (NOTE) The Xpert Xpress SARS-CoV-2/FLU/RSV plus assay is intended as an aid in the diagnosis of  influenza from Nasopharyngeal swab specimens and should not be used as a sole basis for treatment. Nasal washings and aspirates are unacceptable for Xpert Xpress SARS-CoV-2/FLU/RSV testing.  Fact Sheet for Patients: EntrepreneurPulse.com.au  Fact Sheet for Healthcare Providers: IncredibleEmployment.be  This test is not yet approved or cleared by the [place] FDA and has been authorized for detection and/or diagnosis of SARS-CoV-2 by FDA under an Emergency Use Authorization (EUA). This EUA will remain in effect (meaning this test can be used) for the duration of the COVID-19 declaration under Section 564(b)(1) of the Act, 21 U.S.C. section 360bbb-3(b)(1), unless the authorization is terminated or revoked.  Performed at [facility], [place] [address], [place], [place] [address]      Time coordinating discharge: Over 30 minutes  SIGNED:   Kirubel Aja J [place] DO  [facility] 11/23/2020, 7:49 AM

## 2020-11-23 NOTE — Progress Notes (Signed)
Pt is alert and oriented. Discharge instructions/ AVS given to pt. 

## 2020-11-23 NOTE — Progress Notes (Signed)
Chaplain responded to consult for advanced directive. Chaplain carried AD to patient's room and explained that when she has completed it, notary is available only between 2 and 4 in the afternoons. Patient said that she was being discharged today. Chaplain told her that she can fill it out at home with her family member and either take it to her bank or any [employer] store and get it notarized. Patient should always keep the original and have any medical facility she enters to make a copy for her file.    11/23/20 0900  Clinical Encounter Type  Visited With Patient  Visit Type Initial  Referral From Nurse  Consult/Referral To Chaplain  Spiritual Encounters  Spiritual Needs Other (Comment)

## 2020-11-23 NOTE — Progress Notes (Signed)
I am working with Dr. Stanford Breed today in rounds who requested to ensure pt has f/u - per chart review pt has f/u with CHF clinic on 5/16. At most recent OV with Dr. Burt Knack he suggested f/u 6 months. Dr. Stanford Breed would recommend to keep these plans. Will send message to our schedulers to make sure to remind pt of her CHF clinic appt since it does not look like this was outlined on her AVS (patient DC'd before I had been alerted of need for follow-up plan).

## 2020-11-24 ENCOUNTER — Other Ambulatory Visit: Payer: Self-pay

## 2020-11-24 ENCOUNTER — Ambulatory Visit (INDEPENDENT_AMBULATORY_CARE_PROVIDER_SITE_OTHER): Payer: Medicare Other | Admitting: Physician Assistant

## 2020-11-24 ENCOUNTER — Ambulatory Visit (INDEPENDENT_AMBULATORY_CARE_PROVIDER_SITE_OTHER)
Admission: RE | Admit: 2020-11-24 | Discharge: 2020-11-24 | Disposition: A | Discharge status: Home / Self Care | Payer: Medicare Other | Source: Ambulatory Visit | Attending: Vascular Surgery | Admitting: Vascular Surgery

## 2020-11-24 VITALS — BP 146/53 | HR 79 | Temp 98.6°F | Resp 20 | Ht 64.0 in | Wt 199.1 lb

## 2020-11-24 DIAGNOSIS — Z992 Dependence on renal dialysis: Secondary | ICD-10-CM

## 2020-11-24 DIAGNOSIS — N186 End stage renal disease: Secondary | ICD-10-CM

## 2020-11-24 DIAGNOSIS — N183 Chronic kidney disease, stage 3 unspecified: Secondary | ICD-10-CM

## 2020-11-24 NOTE — Progress Notes (Signed)
    Postoperative Access Visit   History of Present Illness   Patricia Mata is a 65 y.o. year old female who presents for postoperative follow-up for right brachiocephalic AV fistula creation on 09/26/20 by Dr. Donzetta Matters. The patient's wounds are healed.  The patient notes no steal symptoms.  The patient is  able to complete their activities of daily living.    She currently is dialyzing via [facility] on Tues/Thurs/ Sat at the [address] location   Physical Examination   Vitals:   11/24/20 1316  BP: (!) 146/53  Pulse: 79  Resp: 20  Temp: 98.6 F (37 C)  TempSrc: Temporal  SpO2: 100%  Weight: 199 lb 1.6 oz (90.3 kg)  Height: 5\' 4"  (1.626 m)   Body mass index is 34.18 kg/m.  right arm Incision is well healed, 2+ radial pulse, hand grip is 5/5, sensation in digits is intact, palpable thrill, bruit can be auscultated. Fistula is easily palpable at [facility] but becomes deep in right upper arm   Non invasive vascular lab: Findings:  +--------------------+----------+-----------------+--------+  AVF         PSV (cm/s)Flow Vol (mL/min)Comments  +--------------------+----------+-----------------+--------+  Native artery inflow  470      739          +--------------------+----------+-----------------+--------+  AVF Anastomosis     601                 +--------------------+----------+-----------------+--------+    +------------+----------+-------------+----------+----------------+  OUTFLOW VEINPSV (cm/s)Diameter (cm)Depth (cm)  Describe    +------------+----------+-------------+----------+----------------+  Shoulder    98    0.65     1.55            +------------+----------+-------------+----------+----------------+  Prox UA     99    0.64     0.71  competing branch  +------------+----------+-------------+----------+----------------+  Mid UA     125    0.78     0.79   competing branch  +------------+----------+-------------+----------+----------------+  Dist UA     78    1.18     0.09            +------------+----------+-------------+----------+----------------+  AC Fossa    487    0.73     0.38            +------------+----------+-------------+----------+----------------+   Medical Decision Making   Patricia Mata is a 65 y.o. year old female who presents s/p right brachiocephalic AV fistula creation on 09/26/20 by Dr. Donzetta Matters. Patent without signs or symptoms of steal syndrome. His duplex shows adequately matured fistula however it is too deep in the right upper arm. There additionally are some competing branches. I discussed with patient need for transposition and ligation of the competing branches. Risks/ benefits of the procedure were discussed with her and she is willing to proceed. Will try to schedule it in the next couple weeks with Dr. Donzetta Matters. She Dialyzes on Tues/ Thurs/ Sat so will try to schedule around her dialysis schedule if possible   Karoline Caldwell, PA-C [facility] Office: [facility] MD: Donzetta Matters

## 2020-11-24 NOTE — H&P (View-Only) (Signed)
    Postoperative Access Visit   History of Present Illness   Patricia Mata is a 65 y.o. year old female who presents for postoperative follow-up for right brachiocephalic AV fistula creation on 09/26/20 by Dr. Donzetta Matters. The patient's wounds are healed.  The patient notes no steal symptoms.  The patient is  able to complete their activities of daily living.    She currently is dialyzing via [facility] on Tues/Thurs/ Sat at the [address] location   Physical Examination   Vitals:   11/24/20 1316  BP: (!) 146/53  Pulse: 79  Resp: 20  Temp: 98.6 F (37 C)  TempSrc: Temporal  SpO2: 100%  Weight: 199 lb 1.6 oz (90.3 kg)  Height: 5\' 4"  (1.626 m)   Body mass index is 34.18 kg/m.  right arm Incision is well healed, 2+ radial pulse, hand grip is 5/5, sensation in digits is intact, palpable thrill, bruit can be auscultated. Fistula is easily palpable at [facility] but becomes deep in right upper arm   Non invasive vascular lab: Findings:  +--------------------+----------+-----------------+--------+  AVF         PSV (cm/s)Flow Vol (mL/min)Comments  +--------------------+----------+-----------------+--------+  Native artery inflow  470      739          +--------------------+----------+-----------------+--------+  AVF Anastomosis     601                 +--------------------+----------+-----------------+--------+    +------------+----------+-------------+----------+----------------+  OUTFLOW VEINPSV (cm/s)Diameter (cm)Depth (cm)  Describe    +------------+----------+-------------+----------+----------------+  Shoulder    98    0.65     1.55            +------------+----------+-------------+----------+----------------+  Prox UA     99    0.64     0.71  competing branch  +------------+----------+-------------+----------+----------------+  Mid UA     125    0.78     0.79   competing branch  +------------+----------+-------------+----------+----------------+  Dist UA     78    1.18     0.09            +------------+----------+-------------+----------+----------------+  AC Fossa    487    0.73     0.38            +------------+----------+-------------+----------+----------------+   Medical Decision Making   Patricia Mata is a 65 y.o. year old female who presents s/p right brachiocephalic AV fistula creation on 09/26/20 by Dr. Donzetta Matters. Patent without signs or symptoms of steal syndrome. His duplex shows adequately matured fistula however it is too deep in the right upper arm. There additionally are some competing branches. I discussed with patient need for transposition and ligation of the competing branches. Risks/ benefits of the procedure were discussed with her and she is willing to proceed. Will try to schedule it in the next couple weeks with Dr. Donzetta Matters. She Dialyzes on Tues/ Thurs/ Sat so will try to schedule around her dialysis schedule if possible   Karoline Caldwell, PA-C [facility] Office: [facility] MD: Donzetta Matters

## 2020-11-26 NOTE — Progress Notes (Signed)
Advanced Heart Failure Clinic Note   PCP: [name], FNP PCP-Cardiologist: Quay Burow, MD  Hematologist: Dr. Alen Blew AHF: Dr. Haroldine Laws  Nephrology: Dr Resa Miner Structural Heart Team: Dr Burt Knack.   HPI: Patricia Mata is a 65 y.o. 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   Admitted 10/19  with AF with RVR and recurrent anemia. Developed respiratory failure and intubated. She was transfused and placed on IV amio. Subsequently developed AKI and placed on CVVHD. Converted to NSR on amio and weaned off the vent.Echoshowed normal LVEF 65-70% with moderate AS and moderate MS and severe PH with RVSP 66mHG. She is not a candidate for intervention of AS or MS with advanced PAH and transfusion-dependent myelodysplasia as a result of her colon CA   [facility] and severely elevated PA pressures.-> V/Q scan was negative. She was started on sildenafil and tolerated well. She diuresed with IV lasix and transitioned to lasix 40 mg PO BID. Palliative care was consulted and decision was made to transition to DNR.  She follows with Dr. SAlen Blewand requires transfusions every 2-3 weeks as an outpatient. She also developed a buttock wound. WOC was consulted and recommended hydrotherapy for coccygeal osteo   [facility]   [place] [place] showed mild PAH in setting of high-output (likely due to anemia). See data below  Admitted for CAP 3/21 and treated w/ abx. Returned to hospital 3 days later for worsening SOB and admitted for acute hypoxic respiratory failure requiring intubation. Chest CT showed multifocal PNA COVID negative..A/C CHF also felt to be contributing.   Admitted 11/21 for acute on chronic CHF. Echo 11/21 EF 60-65% G2DD severe AS mean 331mG AVA 0.84cm2. Moderate MS    Admitted 12/21 for recurrent volume overload and AKI. Cr 6.45. (baseline 2.8-3.5). Diuresed. Creatinine improved to 4.9 on d/c. Weight 221. Dr. GoMoshe Ciprouggested HD but patient refused.   Admitted 10/09/20 with NSTEMI. Hospital course complicated by AKI>>ESRD. Had RHC/LHC with significant CAD with 60% mid LM disease but otherwise non-obstructive CAD. Structural Heart evaluated and there was concern anatomy would make TAVR difficult. Structural Heart team will re-evaluate after she starts HD. During this hospitalization HD was started.   Admitted 11/21/20 with chest pain. Missed HD appointment. Cardiology/Nephrology consulted. HS trop not elevated. Hgb was down to 7.5. Given unit of blood. Had HD and discharged on 11/23/20.   Today she returns for HF follow up.Overall feeling fine. Started dialysis about 4 weeks ago. Tolerating HD-  T-Thur/Sat. Denies SOB/PND/Orthopnea. No chest pain. No dizziness or syncope. Uses an electric cart in the grocery store. No chest pain. Appetite ok. No fever or chills. Weight at home 198-202 pounds. Taking all medications. Nephrology stopped coreg.   Cardiac studies: RHC/LHC 10/13/20   Mid RCA lesion is 20% stenosed.  Mid LM lesion is 60% stenosed.  Ost Cx to Prox Cx lesion is 30% stenosed.  Mid LAD lesion is 30% stenosed  Ao = 152/38 (76) LV =  186/25 RA =  6 RV = 76/11 PA = 74/19 (43) PCW = unable to obtain Fick cardiac output/index = 9.1/4.6 PVR = 2.0 (using LVEDP) Ao sat = 96% PA sat = 67%. 67% AoV: Crossed easily with JR4 and J-wire. Peak-to-peak gradient 365m  Mean gradient 57m31mAVA 1.4cm2 Assessment: 1. Significant CAD with 60% mid LM disease.  O/w non-obstructive CAD 2. EF 60% 3. Moderate to severe pulmonary HTN due to high output 4. Moderate to severe AS Results d/w TAVR team. Given unfavorable anatomy, burden of comorbidites and need for alternative access, I suspect risks of TAVR outweigh possible benefits. Favor medical  management.  [place] 10/20 RA = 8 RV = 53/3 PA = 57/12 (32) PCW = 14 (v waves to 32) Fick cardiac output/index = 8.4/4.5 PVR = 2.1 WU Ao sat = 99% PA sat = 73%, 74% High SVC sat = 74%  RHC 11/19 RA  14 RV  110/15 PA 105/40 (62) PCWP 25 Ao sat 91% PA sat 53% Fick CO/CI  6.8/4.3  PVR 5.4  AoV  Mean 49mHG AVA 1.3 cm2  PFTs 8/20 FEV1 2.24 (112%) FVC 2.63 (103%) DLCO 56%  Echo 02/15/19 EF 65% RV normal size/function. Septal flattening. Severe AS (mean 468mG) RVSP 6453m.   Echo 11/21 EF 60-65% G2DD severe AS mean 76m7mAVA 0.84cm2. Moderate MS RVSP 54mm13m Review of systems complete and found to be negative unless listed in HPI.    Past Medical History:  Diagnosis Date  . Acute renal failure (ARF) (HCC) [place]25/2019  . Carotid artery disease (HCC) [place] Chronic diastolic (congestive) heart failure (HCC) [place] Colon cancer (HCC) [place] Diabetes mellitus (HCC) [place] Gout 09/08/2018  . Heart attack (HCC) [place] Hypertension   . Hypothyroidism   . Macrocytic anemia   . MDS (myelodysplastic syndrome) (HCC) [place] Moderate mitral stenosis   . PAD (peripheral artery disease) (HCC) [place] PAF (paroxysmal atrial fibrillation) (HCC) [place] Pulmonary hypertension (HCC) [place] Renal artery stenosis (HCC)    a. >60% R renal artery stenosis by duplex 2021, managed medically.  . Severe aortic stenosis   . Stage 4 chronic kidney disease (HCC) [place] Transfusion-dependent anemia     Current Outpatient Medications  Medication Sig Dispense Refill  . allopurinol (ZYLOPRIM) 300 MG tablet Take 300 mg by mouth daily.    . amiMarland Kitchendarone (PACERONE) 200 MG tablet Take 1 tablet (200 mg total) by mouth daily. 90 tablet 3  . atorvastatin (LIPITOR) 40 MG tablet Take 1 tablet by mouth once daily 90 tablet 1  . blood glucose meter kit and supplies Dispense based on patient and insurance preference. Use up to four times daily as directed. (FOR ICD-10 E10.9, E11.9). (Patient taking differently: 1 each by Other route See  admin instructions. Dispense based on patient and insurance preference. Use up to four times daily as directed. (FOR ICD-10 E10.9, E11.9).) 1 each 3  . Blood Glucose Monitoring Suppl (ONETOUCH ULTRALINK) w/Device KIT Check blood sugars twice daily E11.9 (Patient taking differently: 1 each by Other route 2 (two) times daily. Check blood sugars twice daily E11.9) 1 kit 0  . cloNIDine (CATAPRES) 0.1 MG tablet Take 1 tablet (0.1 mg total) by mouth 2 (two) times daily. 60 tablet 11  . colchicine 0.6 MG tablet Take 0.6 mg by mouth daily as needed (gout).    . fluticasone (FLONASE) 50 MCG/ACT nasal spray Place 1 spray into both nostrils daily. 16 g 2  . glucose blood test strip check blood sugars twice daily Dx code E11.22 (Patient taking differently: 1 each by Other route See admin instructions. check blood sugars twice daily Dx code E11.22) 100 each 3  . hydrALAZINE (APRESOLINE) 100 MG tablet Take 1 tablet (100 mg total) by mouth 3 (three) times daily. 90[place]  tablet 11  . Insulin Glargine (BASAGLAR KWIKPEN) 100 UNIT/ML Inject 15 Units into the skin daily. Only if above 150    . Insulin Pen Needle 29G X 5MM MISC Use as directed (Patient taking differently: 1 each by Other route as directed.) 200 each 0  . Lancets (ONETOUCH DELICA PLUS [id number]) MISC check blood sugars twice daily Dx code: E11.22 (Patient taking differently: 1 each by Other route See admin instructions. check blood sugars twice daily Dx code: E11.22) 100 each 3  . latanoprost (XALATAN) 0.005 % ophthalmic solution Place 1 drop into both eyes at bedtime.     Marland Kitchen levothyroxine (SYNTHROID) 150 MCG tablet Take 1 tablet (150 mcg total) by mouth daily before breakfast. 30 tablet 2  . linagliptin (TRADJENTA) 5 MG TABS tablet Take 1 tablet (5 mg total) by mouth daily. 90 tablet 1  . loratadine (CLARITIN) 10 MG tablet Take 10 mg by mouth daily.    . Methoxy PEG-Epoetin Beta (MIRCERA IJ) Mircera    . Multiple Vitamins-Minerals (CENTRUM SILVER 50+WOMEN) TABS  Take 1 tablet by mouth daily.    Marland Kitchen ofloxacin (OCUFLOX) 0.3 % ophthalmic solution 10 drops into affected ear    . omeprazole (PRILOSEC) 40 MG capsule TAKE 1 CAPSULE BY MOUTH TWICE DAILY BEFORE A MEAL (Patient taking differently: Take 40 mg by mouth daily.) 60 capsule 0  . polyethylene glycol (MIRALAX / GLYCOLAX) 17 g packet Take 17 g by mouth daily. 14 each 0  . sildenafil (REVATIO) 20 MG tablet Take 1 tablet (20 mg total) by mouth 3 (three) times daily. 90 tablet 3  . sodium bicarbonate 650 MG tablet 2 tablets    . sodium chloride (OCEAN) 0.65 % nasal spray Place 1 spray into the nose in the morning, at noon, in the evening, and at bedtime.    . traZODone (DESYREL) 50 MG tablet Take 0.5 tablets (25 mg total) by mouth at bedtime. 30 tablet 1  . carvedilol (COREG) 6.25 MG tablet Take 6.25 mg by mouth 2 (two) times daily. (Patient not taking: Reported on 11/27/2020)    . furosemide (LASIX) 40 MG tablet Take 1 tablet (40 mg total) by mouth daily. Take 80 mg in AM and 40 mg in PM (4PM) (Patient not taking: Reported on 11/27/2020) 90 tablet 0   No current facility-administered medications for this encounter.    Allergies  Allergen Reactions  . Ancef [Cefazolin] Itching    Severe itching- after procedure, ancef was the antibiotic.-04/02/17 Tolerates penicillins  . Lactose Intolerance (Gi) Diarrhea  . Sulfa Antibiotics Rash and Other (See Comments)    Blisters, also     Social History   Socioeconomic History  . Marital status: Married    Spouse name: Not on file  . Number of children: Not on file  . Years of education: Not on file  . Highest education level: Not on file  Occupational History  . Occupation: retired  Tobacco Use  . Smoking status: Former Smoker    Packs/day: 0.25    Years: 20.00    Pack years: 5.00    Types: Cigarettes    Quit date: 1997    Years since quitting: 25.3  . Smokeless tobacco: Former Network engineer  . Vaping Use: Never used  Substance and Sexual Activity   . Alcohol use: Yes    Alcohol/week: 2.0 standard drinks    Types: 2 Shots of liquor per week    Comment: occ  . Drug use: Never  . Sexual activity:  Not on file  Other Topics Concern  . Not on file  Social History Narrative  . Not on file   Social Determinants of Health   Financial Resource Strain: Medium Risk  . Difficulty of Paying Living Expenses: Somewhat hard  Food Insecurity: Not on file  Transportation Needs: Not on file  Physical Activity: Not on file  Stress: Not on file  Social Connections: Not on file  Intimate Partner Violence: Not on file     Family History  Problem Relation Age of Onset  . Hypertension Mother   . Diabetes Mother   . Cervical cancer Mother   . Heart attack Father   . Hypertension Sister   . Hypertension Brother   . Hypertension Sister   . Hypertension Sister   . Prostate cancer Brother   . HIV/AIDS Brother    Vitals:   11/27/20 1354  BP: (!) 180/62  Pulse: 80  SpO2: 100%  Weight: 91.6 kg    Wt Readings from Last 3 Encounters:  11/27/20 91.6 kg  11/24/20 90.3 kg  11/23/20 88.5 kg    PHYSICAL EXAM: General: Arrived in a wheel chair.  No resp difficulty HEENT: normal Neck: supple. no JVD. Carotids 2+ bilat; no bruits. No lymphadenopathy or thryomegaly appreciated. Cor: PMI nondisplaced. Regular rate & rhythm. No rubs, gallops. 2/6 AS. R  Upper chest HD catheter.  Lungs: clear Abdomen: soft, nontender, nondistended. No hepatosplenomegaly. No bruits or masses. Good bowel sounds. Extremities: no cyanosis, clubbing, rash, edema. RUE AVF Neuro: alert & orientedx3, cranial nerves grossly intact. moves all 4 extremities w/o difficulty. Affect pleasant  ASSESSMENT & PLAN:  1. PAH - Echo 05/12/18 LVEF 65-70%, Grade 2 DD, Mod AS, Mild/Mod MS, Mod TR, Trivial PI, PA peak pressure 93.  - L/RHC 05/21/18 with no significant CAD, severe pulmonary HTN with PA 105/40 (62) PCW 33 PVR 5.4 WU - Suspect multifactorial in nature suspect including  high output due to anemia, WHO group II (diastolic HF and valvular disease) and III (OSA) but given degree of PH cannot exclude WHO Group I component. - Repeat RHC 04/23/19 with much improved pulmonary pressures. PA 57/12 (32) PCWP 14 (v to 32) - Echo 11/21 EF 60-65% G2DD severe AS mean 78mHG AVA 0.84cm2. Moderate MS RVSP 598mG - VQ negative. - PFTs 8/20 with normal spirometry and moderately reduced DLCO. - Sleep study previously ordered but not completed. Will reorder. - Continue sildenafil 20 mg TID cautiously (has AS).   2. Chronic diastolic HF - Echo 8/8/12F 65% with normal RV size and function. + septal flattening. RVSP 64 mmHG. - Echo 11/21 EF 60-65% G2DD severe AS mean 354m AVA 0.84cm2. Moderate MS RVSP 54 mmHG. - Multiple hospitalizations for ADHF/AKI. -NYHA II. Volume managed on HD   3. Severe AS/Moderate MS - Echos 8/20 and again 3/21 with severe AS  - Echo 11/21 EF 60-65% G2DD severe AS mean 91m69mAVA 0.84cm2. Moderate MS RVSP 54mm81m D- She has seen Dr. ShadaAlen Blewhe has made it clear that she has a terminal illness but he would have no issue with using Plavix if needed - Had cath 10/2020 AoV: Crossed easily with JR4 and J-wire. Peak-to-peak gradient 36mmH46mean gradient 33mmHG65m 1.4cm2. Anatomy may not be suitable for TAVR. Structural Heart Team will set up follow up to reassess.    4. PAF -Regular on exam.  -- Continue amiodarone 200 daily.  - Continue to avoid anticoagulation with transfusion dependent anemia. (chronic).  - CHA2DS2/VASc  is at least 4.   5. ESRD On HD T/Th/Sat, so far she is tolerating.    6. 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   7. HTN -Elevated today. Add coreg 3.125 mg twice a day.  -.continue hydralazine 100 tid. - continue clonidine 0.1 mg bid.  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    I personally called Structural Heart PA  to discuss today visit. They will contact her for follow up.  Follow up in 3 months with Dr Haroldine Laws.    Darrick Grinder, NP 11/27/20

## 2020-11-27 ENCOUNTER — Other Ambulatory Visit: Payer: Self-pay

## 2020-11-27 ENCOUNTER — Ambulatory Visit (HOSPITAL_COMMUNITY)
Admission: RE | Admit: 2020-11-27 | Discharge: 2020-11-27 | Disposition: A | Discharge status: Home / Self Care | Payer: Medicare Other | Source: Ambulatory Visit | Attending: Adult Health | Admitting: Adult Health

## 2020-11-27 ENCOUNTER — Encounter (HOSPITAL_COMMUNITY): Payer: Self-pay

## 2020-11-27 VITALS — BP 180/62 | HR 80 | Wt 202.0 lb

## 2020-11-27 DIAGNOSIS — E1122 Type 2 diabetes mellitus with diabetic chronic kidney disease: Secondary | ICD-10-CM | POA: Diagnosis not present

## 2020-11-27 DIAGNOSIS — Z794 Long term (current) use of insulin: Secondary | ICD-10-CM | POA: Insufficient documentation

## 2020-11-27 DIAGNOSIS — I480 Paroxysmal atrial fibrillation: Secondary | ICD-10-CM | POA: Diagnosis not present

## 2020-11-27 DIAGNOSIS — I132 Hypertensive heart and chronic kidney disease with heart failure and with stage 5 chronic kidney disease, or end stage renal disease: Secondary | ICD-10-CM | POA: Diagnosis not present

## 2020-11-27 DIAGNOSIS — I270 Primary pulmonary hypertension: Secondary | ICD-10-CM | POA: Diagnosis present

## 2020-11-27 DIAGNOSIS — Z85038 Personal history of other malignant neoplasm of large intestine: Secondary | ICD-10-CM | POA: Insufficient documentation

## 2020-11-27 DIAGNOSIS — Z8249 Family history of ischemic heart disease and other diseases of the circulatory system: Secondary | ICD-10-CM | POA: Diagnosis not present

## 2020-11-27 DIAGNOSIS — N186 End stage renal disease: Secondary | ICD-10-CM | POA: Diagnosis not present

## 2020-11-27 DIAGNOSIS — I25119 Atherosclerotic heart disease of native coronary artery with unspecified angina pectoris: Secondary | ICD-10-CM | POA: Diagnosis not present

## 2020-11-27 DIAGNOSIS — E669 Obesity, unspecified: Secondary | ICD-10-CM | POA: Insufficient documentation

## 2020-11-27 DIAGNOSIS — I2510 Atherosclerotic heart disease of native coronary artery without angina pectoris: Secondary | ICD-10-CM | POA: Diagnosis not present

## 2020-11-27 DIAGNOSIS — I350 Nonrheumatic aortic (valve) stenosis: Secondary | ICD-10-CM

## 2020-11-27 DIAGNOSIS — Z79899 Other long term (current) drug therapy: Secondary | ICD-10-CM | POA: Insufficient documentation

## 2020-11-27 DIAGNOSIS — I2721 Secondary pulmonary arterial hypertension: Secondary | ICD-10-CM | POA: Diagnosis not present

## 2020-11-27 DIAGNOSIS — I252 Old myocardial infarction: Secondary | ICD-10-CM | POA: Insufficient documentation

## 2020-11-27 DIAGNOSIS — Z992 Dependence on renal dialysis: Secondary | ICD-10-CM | POA: Insufficient documentation

## 2020-11-27 DIAGNOSIS — Z87891 Personal history of nicotine dependence: Secondary | ICD-10-CM | POA: Insufficient documentation

## 2020-11-27 DIAGNOSIS — I5032 Chronic diastolic (congestive) heart failure: Secondary | ICD-10-CM

## 2020-11-27 DIAGNOSIS — M868X8 Other osteomyelitis, other site: Secondary | ICD-10-CM | POA: Diagnosis not present

## 2020-11-27 DIAGNOSIS — D469 Myelodysplastic syndrome, unspecified: Secondary | ICD-10-CM | POA: Insufficient documentation

## 2020-11-27 MED ORDER — CARVEDILOL 3.125 MG PO TABS: 3.1250 mg | ORAL_TABLET | Freq: Two times a day (BID) | ORAL | 3 refills | Status: DC

## 2020-11-27 NOTE — Patient Instructions (Signed)
Please begin Carvedilol 3.125 twice daily Discontinue Furosemide.

## 2020-11-29 ENCOUNTER — Telehealth: Payer: Self-pay

## 2020-11-29 ENCOUNTER — Telehealth: Payer: Self-pay | Admitting: *Deleted

## 2020-11-29 ENCOUNTER — Ambulatory Visit
Admission: RE | Admit: 2020-11-29 | Discharge: 2020-11-29 | Disposition: A | Discharge status: Home / Self Care | Payer: Medicare Other | Source: Ambulatory Visit | Attending: Rheumatology | Admitting: Rheumatology

## 2020-11-29 DIAGNOSIS — R7989 Other specified abnormal findings of blood chemistry: Secondary | ICD-10-CM

## 2020-11-29 NOTE — Telephone Encounter (Signed)
Transition Care Management Follow-up Telephone Call  Date of discharge and from where:11/21/20 [facility]    How have you been since you were released from the hospital? She is doing fine  Any questions or concerns? none  Items Reviewed:  Did the pt receive and understand the discharge instructions provided?yes  Medications obtained and verified? yes  Any new allergies since your discharge? no  Dietary orders reviewed? Low sodium and limit fluid intake  Do you have support at home? yes  Other (ie: DME, Home Health, etc) no  Functional Questionnaire: (I = Independent and D = Dependent)  Bathing/Dressing- I   Meal Prep- I  Eating- I  Maintaining continence- I  Transferring/Ambulation- I  Managing Meds- I   Follow up appointments reviewed:    PCP Hospital f/u appt confirmed? Patricia Brine DNP,FNP-BC 4PM ON 12/04/20  Specialist Hospital f/u appt confirmed? yes  Are transportation arrangements needed? No  If their condition worsens, is the pt aware to call  their PCP or go to the ED?yes  Was the patient provided with contact information for the PCP's office or ED? yes  Was the pt encouraged to call back with questions or concerns? Yes

## 2020-11-29 NOTE — Telephone Encounter (Signed)
-----   Message from [name], MD sent at 11/28/2020  7:04 AM EDT ----- Regarding: RE: Missed appointment Lab + blood transfusion on 5/20.  ----- Message ----- From: Rolene Course, RN Sent: 11/27/2020  12:34 PM EDT To: [name], MD Subject: Missed appointment                             Ms [place]burg called to say she missed her last lab & blood transfusion appointment on 11/23/20 because she was in the hospital.  She has upcoming appointments for lab & transfusion on 12/08/20.  She is asking if she needs an appointment sooner than the 27th since she missed one.  She is also asking if you feel she may need lab appointments more frequently than every 3 weeks, she had to go to the ED recently because of symptomatic anemia.  Please advise, thanks.

## 2020-11-29 NOTE — Telephone Encounter (Signed)
Called pt she states that she had an appointment yesterday with Heart and vascular yesterday and they changed to 6.25 twice daily.

## 2020-11-29 NOTE — Telephone Encounter (Signed)
PC to patient, no answer, left VM - informed patient Dr. Alen Blew wants her to have lab & transfusion appointments on 12/01/20.  Scheduling should be contacting patient with appointment times, high priority scheduling message sent.  Patient informed to call office with any questions [phone]

## 2020-11-29 NOTE — Telephone Encounter (Signed)
-----   Message from Deberah Pelton, NP sent at 11/29/2020  3:18 PM EDT ----- Regarding: RE: Vivify - elevated BPs Sharyn Lull,   Please contact most [facility].  We will increase her carvedilol to 6.25 mg twice daily.  Thank you.  Denyse Amass   ----- Message ----- From: Avelino Leeds Sent: 11/29/2020   3:07 PM EDT To: Deberah Pelton, NP Subject: Vivify - elevated BPs                          Hi Denyse Amass,  This patient's systolic pressure remains elevated across her readings except one. Today her reading was 151/78. Another reading was 157/60.  Best, Amy

## 2020-11-30 ENCOUNTER — Telehealth: Payer: Self-pay | Admitting: *Deleted

## 2020-11-30 ENCOUNTER — Ambulatory Visit (INDEPENDENT_AMBULATORY_CARE_PROVIDER_SITE_OTHER): Payer: Medicare Other

## 2020-11-30 ENCOUNTER — Other Ambulatory Visit: Payer: Self-pay

## 2020-11-30 ENCOUNTER — Other Ambulatory Visit: Payer: Self-pay | Admitting: *Deleted

## 2020-11-30 ENCOUNTER — Inpatient Hospital Stay: Payer: Medicare Other | Attending: Oncology

## 2020-11-30 VITALS — Ht 64.0 in | Wt 200.0 lb

## 2020-11-30 DIAGNOSIS — D469 Myelodysplastic syndrome, unspecified: Secondary | ICD-10-CM

## 2020-11-30 DIAGNOSIS — Z0000 Encounter for general adult medical examination without abnormal findings: Secondary | ICD-10-CM | POA: Diagnosis not present

## 2020-11-30 DIAGNOSIS — D649 Anemia, unspecified: Secondary | ICD-10-CM

## 2020-11-30 DIAGNOSIS — E2839 Other primary ovarian failure: Secondary | ICD-10-CM

## 2020-11-30 DIAGNOSIS — D631 Anemia in chronic kidney disease: Secondary | ICD-10-CM

## 2020-11-30 DIAGNOSIS — N184 Chronic kidney disease, stage 4 (severe): Secondary | ICD-10-CM

## 2020-11-30 LAB — CBC WITH DIFFERENTIAL (CANCER CENTER ONLY)
Abs Immature Granulocytes: 0.02 10*3/uL (ref 0.00–0.07)
Basophils Absolute: 0 10*3/uL (ref 0.0–0.1)
Basophils Relative: 1 %
Eosinophils Absolute: 0 10*3/uL (ref 0.0–0.5)
Eosinophils Relative: 1 %
HCT: 24.5 % — ABNORMAL LOW (ref 36.0–46.0)
Hemoglobin: 8.4 g/dL — ABNORMAL LOW (ref 12.0–15.0)
Immature Granulocytes: 1 %
Lymphocytes Relative: 11 %
Lymphs Abs: 0.5 10*3/uL — ABNORMAL LOW (ref 0.7–4.0)
MCH: 32.2 pg (ref 26.0–34.0)
MCHC: 34.3 g/dL (ref 30.0–36.0)
MCV: 93.9 fL (ref 80.0–100.0)
Monocytes Absolute: 0.9 10*3/uL (ref 0.1–1.0)
Monocytes Relative: 20 %
Neutro Abs: 3 10*3/uL (ref 1.7–7.7)
Neutrophils Relative %: 66 %
Platelet Count: 149 10*3/uL — ABNORMAL LOW (ref 150–400)
RBC: 2.61 MIL/uL — ABNORMAL LOW (ref 3.87–5.11)
RDW: 23.9 % — ABNORMAL HIGH (ref 11.5–15.5)
WBC Count: 4.4 10*3/uL (ref 4.0–10.5)
nRBC: 1.4 % — ABNORMAL HIGH (ref 0.0–0.2)

## 2020-11-30 LAB — PREPARE RBC (CROSSMATCH)

## 2020-11-30 NOTE — Progress Notes (Signed)
I connected with Patricia Mata today by telephone and verified that I am speaking with the correct person using two identifiers. Location patient: home Location provider: work Persons participating in the virtual visit: Marcelene Weidemann, Glenna Durand LPN.   I discussed the limitations, risks, security and privacy concerns of performing an evaluation and management service by telephone and the availability of in person appointments. I also discussed with the patient that there may be a patient responsible charge related to this service. The patient expressed understanding and verbally consented to this telephonic visit.    Interactive audio and video telecommunications were attempted between this provider and patient, however failed, due to patient having technical difficulties OR patient did not have access to video capability.  We continued and completed visit with audio only.     Vital signs may be patient reported or missing.  Subjective:   Patricia Mata is a 65 y.o. female who presents for an Initial Medicare Annual Wellness Visit.  Review of Systems     Cardiac Risk Factors include: advanced age (>71mn, >>41women);diabetes mellitus;obesity (BMI >30kg/m2);sedentary lifestyle     Objective:    Today's Vitals   11/30/20 0927  Weight: 200 lb (90.7 kg)  Height: _0  (1.626 m)   Body mass index is 34.33 kg/m.  Advanced Directives 11/30/2020 11/21/2020 11/21/2020 11/21/2020 10/27/2020 10/10/2020 10/09/2020  Does Patient Have a Medical Advance Directive? _1  No No  Type of Advance Directive - - - - - - -  Does patient want to make changes to medical advance directive? - - - - - - -  Would patient like information on creating a medical advance directive? - Yes (Inpatient - patient requests chaplain consult to create a medical advance directive) No - Patient declined - No - Patient declined No - Patient declined -  Pre-existing out of facility DNR order (yellow form or pink  MOST form) - - - - - - -    Current Medications (verified) Outpatient Encounter Medications as of 11/30/2020  Medication Sig  . allopurinol (ZYLOPRIM) 300 MG tablet Take 300 mg by mouth daily.  .Marland Kitchenamiodarone (PACERONE) 200 MG tablet Take 1 tablet (200 mg total) by mouth daily.  .Marland Kitchenatorvastatin (LIPITOR) 40 MG tablet Take 1 tablet by mouth once daily  . blood glucose meter kit and supplies Dispense based on patient and insurance preference. Use up to four times daily as directed. (FOR ICD-10 E10.9, E11.9). (Patient taking differently: 1 each by Other route See admin instructions. Dispense based on patient and insurance preference. Use up to four times daily as directed. (FOR ICD-10 E10.9, E11.9).)  . Blood Glucose Monitoring Suppl (ONETOUCH ULTRALINK) w/Device KIT Check blood sugars twice daily E11.9 (Patient taking differently: 1 each by Other route 2 (two) times daily. Check blood sugars twice daily E11.9)  . carvedilol (COREG) 3.125 MG tablet Take 1 tablet (3.125 mg total) by mouth 2 (two) times daily.  . cloNIDine (CATAPRES) 0.1 MG tablet Take 1 tablet (0.1 mg total) by mouth 2 (two) times daily.  . colchicine 0.6 MG tablet Take 0.6 mg by mouth daily as needed (gout).  . fluticasone (FLONASE) 50 MCG/ACT nasal spray Place 1 spray into both nostrils daily.  .Marland Kitchenglucose blood test strip check blood sugars twice daily Dx code E11.22 (Patient taking differently: 1 each by Other route See admin instructions. check blood sugars twice daily Dx code E11.22)  . hydrALAZINE (APRESOLINE) 100 MG tablet Take 1 tablet (100 mg  total) by mouth 3 (three) times daily.  . Insulin Glargine (BASAGLAR KWIKPEN) 100 UNIT/ML Inject 15 Units into the skin daily. Only if above 150  . Insulin Pen Needle 29G X 5MM MISC Use as directed (Patient taking differently: 1 each by Other route as directed.)  . Lancets (ONETOUCH DELICA PLUS [id number]) MISC check blood sugars twice daily Dx code: E11.22 (Patient taking differently: 1 each  by Other route See admin instructions. check blood sugars twice daily Dx code: E11.22)  . latanoprost (XALATAN) 0.005 % ophthalmic solution Place 1 drop into both eyes at bedtime.   Marland Kitchen levothyroxine (SYNTHROID) 150 MCG tablet Take 1 tablet (150 mcg total) by mouth daily before breakfast.  . linagliptin (TRADJENTA) 5 MG TABS tablet Take 1 tablet (5 mg total) by mouth daily.  Marland Kitchen loratadine (CLARITIN) 10 MG tablet Take 10 mg by mouth daily.  . Multiple Vitamins-Minerals (CENTRUM SILVER 50+WOMEN) TABS Take 1 tablet by mouth daily.  Marland Kitchen omeprazole (PRILOSEC) 40 MG capsule TAKE 1 CAPSULE BY MOUTH TWICE DAILY BEFORE A MEAL (Patient taking differently: Take 40 mg by mouth daily.)  . polyethylene glycol (MIRALAX / GLYCOLAX) 17 g packet Take 17 g by mouth daily.  . sildenafil (REVATIO) 20 MG tablet Take 1 tablet (20 mg total) by mouth 3 (three) times daily.  . sodium bicarbonate 650 MG tablet 2 tablets  . Methoxy PEG-Epoetin Beta (MIRCERA IJ) Mircera (Patient not taking: Reported on 11/30/2020)  . ofloxacin (OCUFLOX) 0.3 % ophthalmic solution 10 drops into affected ear (Patient not taking: Reported on 11/30/2020)  . sodium chloride (OCEAN) 0.65 % nasal spray Place 1 spray into the nose in the morning, at noon, in the evening, and at bedtime. (Patient not taking: Reported on 11/30/2020)  . traZODone (DESYREL) 50 MG tablet Take 0.5 tablets (25 mg total) by mouth at bedtime. (Patient not taking: Reported on 11/30/2020)   No facility-administered encounter medications on file as of 11/30/2020.    Allergies (verified) Ancef [cefazolin], Lactose intolerance (gi), and Sulfa antibiotics   History: Past Medical History:  Diagnosis Date  . Acute renal failure (ARF) ([place]) 06/08/2018  . Carotid artery disease ([place])   . Chronic diastolic (congestive) heart failure ([place])   . Colon cancer ([place])   . Diabetes mellitus ([place])   . Gout 09/08/2018  . Heart attack ([place])   . Hypertension   . Hypothyroidism   . Macrocytic  anemia   . MDS (myelodysplastic syndrome) ([place])   . Moderate mitral stenosis   . PAD (peripheral artery disease) ([place])   . PAF (paroxysmal atrial fibrillation) ([place])   . Pulmonary hypertension ([place])   . Renal artery stenosis ([facility])    a. >60% R renal artery stenosis by duplex 2021, managed medically.  . Severe aortic stenosis   . Stage 4 chronic kidney disease ([place])   . Transfusion-dependent anemia    Past Surgical History:  Procedure Laterality Date  . AV FISTULA PLACEMENT Right 09/26/2020   Procedure: RIGHT ARM ARTERIOVENOUS (AV) FISTULA CREATION;  Surgeon: Waynetta Sandy, MD;  [facility];  Service: Vascular;  Laterality: Right;  Monitor Anesthesia Care with Regional Block to Right Arm   . COLON SURGERY    . DEBRIDMENT OF DECUBITUS ULCER N/A 06/12/2018   Procedure: DEBRIDMENT OF DECUBITUS ULCER;  Surgeon: Wallace Going, DO;  Location: [facility] ORS;  Service: Plastics;  Laterality: N/A;  . ESOPHAGOGASTRODUODENOSCOPY N/A 09/01/2020   Procedure: ESOPHAGOGASTRODUODENOSCOPY (EGD);  Surgeon: Clarene Essex, MD;  Location: [facility];  Service: Endoscopy;  Laterality: N/A;  . GIVENS CAPSULE STUDY N/A 09/01/2020   Procedure: GIVENS CAPSULE STUDY;  Surgeon: Clarene Essex, MD;  Location: [facility] ENDOSCOPY;  Service: Endoscopy;  Laterality: N/A;  . IR FLUORO GUIDE CV LINE RIGHT  10/11/2020  . IR FLUORO GUIDE PORT INSERTION RIGHT  04/02/2017  . IR REMOVAL TUN ACCESS W/ PORT W/O FL MOD SED  03/16/2019  . IR US GUIDE VASC ACCESS RIGHT  04/02/2017  . IR US GUIDE VASC ACCESS RIGHT  10/11/2020  . RIGHT HEART CATH N/A 04/23/2019   Procedure: RIGHT HEART CATH;  Surgeon: Jolaine Artist, MD;  Location: [facility] LAB;  Service: Cardiovascular;  Laterality: N/A;  . RIGHT/LEFT HEART CATH AND CORONARY ANGIOGRAPHY N/A 05/21/2018   Procedure: RIGHT/LEFT HEART CATH AND CORONARY ANGIOGRAPHY;  Surgeon: Nelva Bush, MD;  Location: [facility] LAB;  Service: Cardiovascular;  Laterality: N/A;  .  RIGHT/LEFT HEART CATH AND CORONARY ANGIOGRAPHY N/A 10/13/2020   Procedure: RIGHT/LEFT HEART CATH AND CORONARY ANGIOGRAPHY;  Surgeon: Jolaine Artist, MD;  Location: [place] [facility] LAB;  Service: Cardiovascular;  Laterality: N/A;  . TUBAL LIGATION     Family History  Problem Relation Age of Onset  . Hypertension Mother   . Diabetes Mother   . Cervical cancer Mother   . Heart attack Father   . Hypertension Sister   . Hypertension Brother   . Hypertension Sister   . Hypertension Sister   . Prostate cancer Brother   . HIV/AIDS Brother    Social History   Socioeconomic History  . Marital status: Married    Spouse name: Not on file  . Number of children: Not on file  . Years of education: Not on file  . Highest education level: Not on file  Occupational History  . Occupation: retired  Tobacco Use  . Smoking status: Former Smoker    Packs/day: 0.25    Years: 20.00    Pack years: 5.00    Types: Cigarettes    Quit date: 1997    Years since quitting: 25.3  . Smokeless tobacco: Former Network engineer  . Vaping Use: Never used  Substance and Sexual Activity  . Alcohol use: Yes    Alcohol/week: 2.0 standard drinks    Types: 2 Shots of liquor per week    Comment: occ  . Drug use: Never  . Sexual activity: Not on file  Other Topics Concern  . Not on file  Social History Narrative  . Not on file   Social Determinants of Health   Financial Resource Strain: Low Risk   . Difficulty of Paying Living Expenses: Not hard at all  Food Insecurity: No Food Insecurity  . Worried About Charity fundraiser in the Last Year: Never true  . Ran Out of Food in the Last Year: Never true  Transportation Needs: No Transportation Needs  . Lack of Transportation (Medical): No  . Lack of Transportation (Non-Medical): No  Physical Activity: Inactive  . Days of Exercise per Week: 0 days  . Minutes of Exercise per Session: 0 min  Stress: No Stress Concern Present  . Feeling of Stress : Not  at all  Social Connections: Not on file    Tobacco Counseling Counseling given: Not Answered   Clinical Intake:  Pre-visit preparation completed: Yes  Pain : No/denies pain     Nutritional Status: BMI > 30  Obese Nutritional Risks: None Diabetes: Yes  How often do you need to have someone help  you when you read instructions, pamphlets, or other written materials from your doctor or pharmacy?: 1 - Never What is the last grade level you completed in school?: 12th grade  Diabetic? Yes Nutrition Risk Assessment:  Has the patient had any N/V/D within the last 2 months?  No  Does the patient have any non-healing wounds?  No  Has the patient had any unintentional weight loss or weight gain?  No   Diabetes:  Is the patient diabetic?  Yes  If diabetic, was a CBG obtained today?  No  Did the patient bring in their glucometer from home?  No  How often do you monitor your CBG's? daily.   Financial Strains and Diabetes Management:  Are you having any financial strains with the device, your supplies or your medication? No .  Does the patient want to be seen by Chronic Care Management for management of their diabetes?  No  Would the patient like to be referred to a Nutritionist or for Diabetic Management?  No   Diabetic Exams:  Diabetic Eye Exam: Completed 08/08/2020 Diabetic Foot Exam: Overdue, Pt has been advised about the importance in completing this exam. Pt is scheduled for diabetic foot exam on next appointment.   Interpreter Needed?: No  Information entered by :: NAllen LPN   Activities of Daily Living In your present state of health, do you have any difficulty performing the following activities: 11/30/2020 11/21/2020  Hearing? N N  Vision? N N  Difficulty concentrating or making decisions? N Y  Walking or climbing stairs? Y Y  Comment - -  Dressing or bathing? N N  Doing errands, shopping? N N  Preparing Food and eating ? N -  Using the Toilet? N -  In the  past six months, have you accidently leaked urine? N -  Do you have problems with loss of bowel control? N -  Managing your Medications? N -  Managing your Finances? N -  Housekeeping or managing your Housekeeping? N -  Some recent data might be hidden    Patient Care Team: [name], FNP as PCP - General ([place]) Lorretta Harp, MD as PCP - Cardiology (Cardiology) Bensimhon, Shaune Pascal, MD as PCP - Advanced Heart Failure (Cardiology) Mayford Knife, [facility] (Pharmacist) [place] any recent Medical Services you may have received from other than [facility] providers in the past year (date may be approximate).     Assessment:   This is a routine wellness examination for [facility].  Hearing/Vision screen No exam data present  Dietary issues and exercise activities discussed: Current Exercise Habits: The patient does not participate in regular exercise at present  Goals Addressed            This Visit's Progress   . Patient Stated       11/30/2020, live day by day      Depression Screen PHQ 2/9 Scores 11/30/2020 07/26/2020 07/13/2020 06/22/2019 06/08/2019 12/09/2018 09/09/2018  PHQ - 2 Score 0 0 0 0 0 0 0  PHQ- 9 Score - 0 0 - 0 - -    Fall Risk Fall Risk  11/30/2020 07/13/2020 06/22/2019 06/08/2019 12/09/2018  Falls in the past year? 0 0 0 0 0  Number falls in past yr: - 0 - 0 -  Injury with Fall? - 0 - - -  Risk for fall due to : Medication side effect - - (No Data) -  Risk for fall due to: Comment - - -  was in a comma for 2 weeks and had to learn to walk again -  Follow up Falls evaluation completed;Education provided;Falls prevention discussed - - - -    FALL RISK PREVENTION PERTAINING TO THE HOME:  Any stairs in or around the home? No  If so, are there any without handrails? n/a Home free of loose throw rugs in walkways, pet beds, electrical cords, etc? Yes  Adequate lighting in your home to reduce risk of falls? Yes   ASSISTIVE  DEVICES UTILIZED TO PREVENT FALLS:  Life alert? No  Use of a cane, walker or w/c? No  Grab bars in the bathroom? No  Shower chair or bench in shower? Yes  Elevated toilet seat or a handicapped toilet? No   TIMED UP AND GO:  Was the test performed? No .   Cognitive Function:     6CIT Screen 11/30/2020  What Year? 0 points  What month? 0 points  What time? 0 points  Count back from 20 0 points  Months in reverse 2 points  Repeat phrase 10 points  Total Score 12    Immunizations Immunization History  Administered Date(s) Administered  . Fluad Quad(high Dose 65+) 04/28/2020  . Influenza,inj,Quad PF,6+ Mos 05/09/2017, 09/15/2018, 04/13/2019  . Influenza-Unspecified 04/05/2016  . PFIZER(Purple Top)SARS-COV-2 Vaccination 09/11/2019, 01/10/2020, 04/07/2020  . Pneumococcal Conjugate-13 05/04/2020    TDAP status: Up to date  Flu Vaccine status: Up to date  Pneumococcal vaccine status: Up to date  Covid-19 vaccine status: Completed vaccines  Qualifies for Shingles Vaccine? Yes   Zostavax completed No   Shingrix Completed?: No.    Education has been provided regarding the importance of this vaccine. Patient has been advised to call insurance company to determine out of pocket expense if they have not yet received this vaccine. Advised may also receive vaccine at local pharmacy or Health Dept. Verbalized acceptance and understanding.  Screening Tests Health Maintenance  Topic Date Due  . DEXA SCAN  Never done  . FOOT EXAM  01/08/2020  . COVID-19 Vaccine (4 - Booster for Pfizer series) 07/07/2020  . INFLUENZA VACCINE  02/12/2021  . HEMOGLOBIN A1C  04/26/2021  . PNA vac Low Risk Adult (2 of 2 - PPSV23) 05/04/2021  . OPHTHALMOLOGY EXAM  08/08/2021  . TETANUS/TDAP  02/02/2022  . MAMMOGRAM  05/31/2022  . COLONOSCOPY (Pts 45-63yrs Insurance coverage will need to be confirmed)  09/06/2029  . Hepatitis C Screening  Completed  . HPV VACCINES  Aged Out    Health  Maintenance  Health Maintenance Due  Topic Date Due  . DEXA SCAN  Never done  . FOOT EXAM  01/08/2020  . COVID-19 Vaccine (4 - Booster for Pfizer series) 07/07/2020    Colorectal cancer screening: Type of screening: Colonoscopy. Completed 09/07/2019. Repeat every 10 years  Mammogram status: Completed 05/31/2020. Repeat every year  Bone Density status: Ordered today. Pt provided with contact info and advised to call to schedule appt.  Lung Cancer Screening: (Low Dose CT Chest recommended if Age 29-80 years, 30 pack-year currently smoking OR have quit w/in 15years.) does not qualify.   Lung Cancer Screening Referral: no  Additional Screening:  Hepatitis C Screening: does qualify; Completed 04/19/2019  Vision Screening: Recommended annual ophthalmology exams for early detection of glaucoma and other disorders of the eye. Is the patient up to date with their annual eye exam?  Yes  Who is the provider or what is the name of the office in which the patient attends annual  eye exams? Dr. Gershon Crane If pt is not established with a provider, would they like to be referred to a provider to establish care? No .   Dental Screening: Recommended annual dental exams for proper oral hygiene  Community Resource Referral / Chronic Care Management: CRR required this visit?  No   CCM required this visit?  No      Plan:     I have personally reviewed and noted the following in the patient's chart:   . Medical and social history . Use of alcohol, tobacco or illicit drugs  . Current medications and supplements including opioid prescriptions. Patient is not currently taking opioid prescriptions. . Functional ability and status . Nutritional status . Physical activity . Advanced directives . List of other physicians . Hospitalizations, surgeries, and ER visits in previous 12 months . Vitals . Screenings to include cognitive, depression, and falls . Referrals and appointments  In addition, I  have reviewed and discussed with patient certain preventive protocols, quality metrics, and best practice recommendations. A written personalized care plan for preventive services as well as general preventive health recommendations were provided to patient.     Kellie Simmering, LPN   3/70/2301   Nurse Notes:

## 2020-11-30 NOTE — Telephone Encounter (Signed)
PC to patient, informed patient she is scheduled for blood transfusion tomorrow morning at 8:00 a.m. at [facility], she also needs to come today to [facility] to have blood drawn for T&S by 3:00 p.m.  Patient is agreeable, address to [place] location given.  Patient instructed to leave blue blood bank bracelet on.  Patient verbalizes understanding.

## 2020-11-30 NOTE — Patient Instructions (Signed)
Patricia Mata , Thank you for taking time to come for your Medicare Wellness Visit. I appreciate your ongoing commitment to your health goals. Please review the following plan we discussed and let me know if I can assist you in the future.   Screening recommendations/referrals: Colonoscopy: completed 09/07/2019 Mammogram: completed 05/31/2020 Bone Density: ordered Recommended yearly ophthalmology/optometry visit for glaucoma screening and checkup Recommended yearly dental visit for hygiene and checkup  Vaccinations: Influenza vaccine: completed 04/28/2020, due 02/12/2021 Pneumococcal vaccine: completed 05/04/2020 Tdap vaccine: completed 02/03/2012, due 02/02/2022 Shingles vaccine: discussed   Covid-19: 04/07/2020, 01/10/2020, 09/11/2019  Advanced directives: Advance directive discussed with you today. .  Conditions/risks identified: none  Next appointment: Follow up in one year for your annual wellness visit    Preventive Care 65 Years and Older, Female Preventive care refers to lifestyle choices and visits with your health care provider that can promote health and wellness. What does preventive care include?  A yearly physical exam. This is also called an annual well check.  Dental exams once or twice a year.  Routine eye exams. Ask your health care provider how often you should have your eyes checked.  Personal lifestyle choices, including:  Daily care of your teeth and gums.  Regular physical activity.  Eating a healthy diet.  Avoiding tobacco and drug use.  Limiting alcohol use.  Practicing safe sex.  Taking low-dose aspirin every day.  Taking vitamin and mineral supplements as recommended by your health care provider. What happens during an annual well check? The services and screenings done by your health care provider during your annual well check will depend on your age, overall health, lifestyle risk factors, and family history of disease. Counseling  Your health  care provider may ask you questions about your:  Alcohol use.  Tobacco use.  Drug use.  Emotional well-being.  Home and relationship well-being.  Sexual activity.  Eating habits.  History of falls.  Memory and ability to understand (cognition).  Work and work Statistician.  Reproductive health. Screening  You may have the following tests or measurements:  Height, weight, and BMI.  Blood pressure.  Lipid and cholesterol levels. These may be checked every 5 years, or more frequently if you are over 34 years old.  Skin check.  Lung cancer screening. You may have this screening every year starting at age 87 if you have a 30-pack-year history of smoking and currently smoke or have quit within the past 15 years.  Fecal occult blood test (FOBT) of the stool. You may have this test every year starting at age 40.  Flexible sigmoidoscopy or colonoscopy. You may have a sigmoidoscopy every 5 years or a colonoscopy every 10 years starting at age 60.  Hepatitis C blood test.  Hepatitis B blood test.  Sexually transmitted disease (STD) testing.  Diabetes screening. This is done by checking your blood sugar (glucose) after you have not eaten for a while (fasting). You may have this done every 1-3 years.  Bone density scan. This is done to screen for osteoporosis. You may have this done starting at age 11.  Mammogram. This may be done every 1-2 years. Talk to your health care provider about how often you should have regular mammograms. Talk with your health care provider about your test results, treatment options, and if necessary, the need for more tests. Vaccines  Your health care provider may recommend certain vaccines, such as:  Influenza vaccine. This is recommended every year.  Tetanus, diphtheria, and acellular pertussis (  Tdap, Td) vaccine. You may need a Td booster every 10 years.  Zoster vaccine. You may need this after age 50.  Pneumococcal 13-valent conjugate  (PCV13) vaccine. One dose is recommended after age 72.  Pneumococcal polysaccharide (PPSV23) vaccine. One dose is recommended after age 21. Talk to your health care provider about which screenings and vaccines you need and how often you need them. This information is not intended to replace advice given to you by your health care provider. Make sure you discuss any questions you have with your health care provider. Document Released: 07/28/2015 Document Revised: 03/20/2016 Document Reviewed: 05/02/2015 Elsevier Interactive Patient Education  2017 [employer] Prevention in the Home Falls can cause injuries. They can happen to people of all ages. There are many things you can do to make your home safe and to help prevent falls. What can I do on the outside of my home?  Regularly fix the edges of walkways and driveways and fix any cracks.  Remove anything that might make you trip as you walk through a door, such as a raised step or threshold.  Trim any bushes or trees on the path to your home.  Use bright outdoor lighting.  Clear any walking paths of anything that might make someone trip, such as rocks or tools.  Regularly check to see if handrails are loose or broken. Make sure that both sides of any steps have handrails.  Any raised decks and porches should have guardrails on the edges.  Have any leaves, snow, or ice cleared regularly.  Use sand or salt on walking paths during winter.  Clean up any spills in your garage right away. This includes oil or grease spills. What can I do in the bathroom?  Use night lights.  Install grab bars by the toilet and in the tub and shower. Do not use towel bars as grab bars.  Use non-skid mats or decals in the tub or shower.  If you need to sit down in the shower, use a plastic, non-slip stool.  Keep the floor dry. Clean up any water that spills on the floor as soon as it happens.  Remove soap buildup in the tub or shower  regularly.  Attach bath mats securely with double-sided non-slip rug tape.  Do not have throw rugs and other things on the floor that can make you trip. What can I do in the bedroom?  Use night lights.  Make sure that you have a light by your bed that is easy to reach.  Do not use any sheets or blankets that are too big for your bed. They should not hang down onto the floor.  Have a firm chair that has side arms. You can use this for support while you get dressed.  Do not have throw rugs and other things on the floor that can make you trip. What can I do in the kitchen?  Clean up any spills right away.  Avoid walking on wet floors.  Keep items that you use a lot in easy-to-reach places.  If you need to reach something above you, use a strong step stool that has a grab bar.  Keep electrical cords out of the way.  Do not use floor polish or wax that makes floors slippery. If you must use wax, use non-skid floor wax.  Do not have throw rugs and other things on the floor that can make you trip. What can I do with my stairs?  Do  not leave any items on the stairs.  Make sure that there are handrails on both sides of the stairs and use them. Fix handrails that are broken or loose. Make sure that handrails are as long as the stairways.  Check any carpeting to make sure that it is firmly attached to the stairs. Fix any carpet that is loose or worn.  Avoid having throw rugs at the top or bottom of the stairs. If you do have throw rugs, attach them to the floor with carpet tape.  Make sure that you have a light switch at the top of the stairs and the bottom of the stairs. If you do not have them, ask someone to add them for you. What else can I do to help prevent falls?  Wear shoes that:  Do not have high heels.  Have rubber bottoms.  Are comfortable and fit you well.  Are closed at the toe. Do not wear sandals.  If you use a stepladder:  Make sure that it is fully  opened. Do not climb a closed stepladder.  Make sure that both sides of the stepladder are locked into place.  Ask someone to hold it for you, if possible.  Clearly mark and make sure that you can see:  Any grab bars or handrails.  First and last steps.  Where the edge of each step is.  Use tools that help you move around (mobility aids) if they are needed. These include:  Canes.  Walkers.  Scooters.  Crutches.  Turn on the lights when you go into a dark area. Replace any light bulbs as soon as they burn out.  Set up your furniture so you have a clear path. Avoid moving your furniture around.  If any of your floors are uneven, fix them.  If there are any pets around you, be aware of where they are.  Review your medicines with your doctor. Some medicines can make you feel dizzy. This can increase your chance of falling. Ask your doctor what other things that you can do to help prevent falls. This information is not intended to replace advice given to you by your health care provider. Make sure you discuss any questions you have with your health care provider. Document Released: 04/27/2009 Document Revised: 12/07/2015 Document Reviewed: 08/05/2014 Elsevier Interactive Patient Education  2017 [employer].

## 2020-11-30 NOTE — Telephone Encounter (Signed)
Patient's Hgb is 8.4 today, Dr. Alen Blew informed.  Per MD he wants to go ahead with blood transfusion tomorrow.

## 2020-12-01 ENCOUNTER — Inpatient Hospital Stay: Payer: Medicare Other | Attending: Oncology

## 2020-12-01 ENCOUNTER — Other Ambulatory Visit: Payer: Medicare Other

## 2020-12-01 ENCOUNTER — Telehealth: Payer: Self-pay | Admitting: Emergency Medicine

## 2020-12-01 ENCOUNTER — Other Ambulatory Visit: Payer: Self-pay | Admitting: Nurse Practitioner

## 2020-12-01 ENCOUNTER — Telehealth: Payer: Self-pay

## 2020-12-01 DIAGNOSIS — D630 Anemia in neoplastic disease: Secondary | ICD-10-CM | POA: Diagnosis not present

## 2020-12-01 DIAGNOSIS — N189 Chronic kidney disease, unspecified: Secondary | ICD-10-CM | POA: Insufficient documentation

## 2020-12-01 DIAGNOSIS — D649 Anemia, unspecified: Secondary | ICD-10-CM

## 2020-12-01 DIAGNOSIS — I129 Hypertensive chronic kidney disease with stage 1 through stage 4 chronic kidney disease, or unspecified chronic kidney disease: Secondary | ICD-10-CM | POA: Insufficient documentation

## 2020-12-01 DIAGNOSIS — D631 Anemia in chronic kidney disease: Secondary | ICD-10-CM | POA: Insufficient documentation

## 2020-12-01 DIAGNOSIS — D469 Myelodysplastic syndrome, unspecified: Secondary | ICD-10-CM | POA: Insufficient documentation

## 2020-12-01 DIAGNOSIS — Z0000 Encounter for general adult medical examination without abnormal findings: Secondary | ICD-10-CM

## 2020-12-01 MED ORDER — ACETAMINOPHEN 325 MG PO TABS
650.0000 mg | ORAL_TABLET | Freq: Once | ORAL | Status: AC
  Administered 2020-12-01: 650 mg via ORAL
  Filled 2020-12-01: qty 2

## 2020-12-01 MED ORDER — DIPHENHYDRAMINE HCL 25 MG PO CAPS
25.0000 mg | ORAL_CAPSULE | Freq: Once | ORAL | Status: AC
  Administered 2020-12-01: 25 mg via ORAL
  Filled 2020-12-01: qty 1

## 2020-12-01 MED ORDER — SODIUM CHLORIDE 0.9% IV SOLUTION
250.0000 mL | Freq: Once | INTRAVENOUS | Status: AC
  Administered 2020-12-01: 250 mL via INTRAVENOUS
  Filled 2020-12-01: qty 250

## 2020-12-01 MED ORDER — FUROSEMIDE 10 MG/ML IJ SOLN
20.0000 mg | Freq: Once | INTRAMUSCULAR | Status: AC
  Administered 2020-12-01: 20 mg via INTRAVENOUS
  Filled 2020-12-01: qty 2

## 2020-12-01 NOTE — Telephone Encounter (Signed)
Called patient to check to see if she was okay after getting the blood pressure reading of 143/40. Patient stated that she was getting blood at the time of the reading, but it went up after the procedure was complete. Patient stated that she was feeling well. Patient is aware that Care Guide will continue to monitor her blood pressure readings and check-in whenever there is an abnormal reading.

## 2020-12-01 NOTE — Telephone Encounter (Signed)
Call report to our office in error  We did not order anything on this pt  [place] call report should be called to Dr Trudie Reed and Opal Sidles aware

## 2020-12-01 NOTE — Patient Instructions (Signed)

## 2020-12-01 NOTE — Progress Notes (Signed)
Per Dr. Alen Blew: No Reblozyl injection administered today. Patient informed and she verbalized agreement.

## 2020-12-02 ENCOUNTER — Ambulatory Visit: Payer: Medicare Other

## 2020-12-04 ENCOUNTER — Telehealth: Payer: Self-pay

## 2020-12-04 ENCOUNTER — Ambulatory Visit (INDEPENDENT_AMBULATORY_CARE_PROVIDER_SITE_OTHER): Payer: Medicare Other | Admitting: Nurse Practitioner

## 2020-12-04 ENCOUNTER — Other Ambulatory Visit: Payer: Self-pay

## 2020-12-04 ENCOUNTER — Encounter: Payer: Self-pay | Admitting: Nurse Practitioner

## 2020-12-04 VITALS — BP 130/68 | HR 63 | Temp 98.4°F | Ht 64.2 in | Wt 200.4 lb

## 2020-12-04 DIAGNOSIS — D649 Anemia, unspecified: Secondary | ICD-10-CM

## 2020-12-04 DIAGNOSIS — E6609 Other obesity due to excess calories: Secondary | ICD-10-CM

## 2020-12-04 DIAGNOSIS — J9601 Acute respiratory failure with hypoxia: Secondary | ICD-10-CM

## 2020-12-04 DIAGNOSIS — I129 Hypertensive chronic kidney disease with stage 1 through stage 4 chronic kidney disease, or unspecified chronic kidney disease: Secondary | ICD-10-CM | POA: Diagnosis not present

## 2020-12-04 DIAGNOSIS — N281 Cyst of kidney, acquired: Secondary | ICD-10-CM

## 2020-12-04 DIAGNOSIS — Z6834 Body mass index (BMI) 34.0-34.9, adult: Secondary | ICD-10-CM

## 2020-12-04 DIAGNOSIS — E1122 Type 2 diabetes mellitus with diabetic chronic kidney disease: Secondary | ICD-10-CM

## 2020-12-04 DIAGNOSIS — I10 Essential (primary) hypertension: Secondary | ICD-10-CM

## 2020-12-04 DIAGNOSIS — E039 Hypothyroidism, unspecified: Secondary | ICD-10-CM

## 2020-12-04 DIAGNOSIS — N184 Chronic kidney disease, stage 4 (severe): Secondary | ICD-10-CM | POA: Diagnosis not present

## 2020-12-04 DIAGNOSIS — Z794 Long term (current) use of insulin: Secondary | ICD-10-CM

## 2020-12-04 LAB — BPAM RBC
Blood Product Expiration Date: 202206162359
Blood Product Expiration Date: 202206162359
ISSUE DATE / TIME: 202205200729
ISSUE DATE / TIME: 202205200729
Unit Type and Rh: 600
Unit Type and Rh: 600

## 2020-12-04 LAB — TYPE AND SCREEN
ABO/RH(D): A NEG
Antibody Screen: NEGATIVE
Unit division: 0
Unit division: 0

## 2020-12-04 NOTE — Chronic Care Management (AMB) (Signed)
Left patient a voicemail on both lines with appointment reminder with Orlando Penner ,[place] on 12-05-2020 at 10:00. Instructed patient to have all meds/supplements, any logs available for review and if unable to keep appointment to call to reschedule.  [place] Pharmacist Assistant [phone]

## 2020-12-04 NOTE — Progress Notes (Signed)
I,[name],acting as a Education administrator for [employer], FNP.,have documented all relevant documentation on the behalf of [name], FNP,as directed by  [name], FNP while in the presence of [name], [place].  This visit occurred during the SARS-CoV-2 public health emergency.  Safety protocols were in place, including screening questions prior to the visit, additional usage of staff PPE, and extensive cleaning of exam room while observing appropriate contact time as indicated for disinfecting solutions.  Subjective:     Patient ID: Patricia Mata , female    DOB: 09/22/1955 , 65 y.o.   MRN: [id number]   Chief Complaint  Patient presents with   Hospitalization Follow-up    HPI  Pt here for hospital f/u.  She had been having chest pain and left arm pain on the way to dialysis so she went to [facility].  She is in good spirits.   Admitted from 5/10-5/12 after having chest pain and pain radiating down left arm. She has 3 units of PRBC in hospital and 2 on Friday. Her Hgb had dropped to 7.3 most recent CBC with the cancer center is 8.4. reports she is feeling fine since being discharged. She is back on track with Hemodialysis, had 2 days in a row. TTh and Saturday.  She is not receiving home health or PT.    Has a vascath in place to right upper chest her fistula to right arm is too deep according to the patient she is to have a procedure on June 1    Past Medical History:  Diagnosis Date   Acute renal failure (ARF) ([place]) 06/08/2018   TTHS- [place]   Carotid artery disease [facility])    Chronic diastolic (congestive) heart failure ([place])    Colon cancer ([place]) 2008   Diabetes mellitus ([place])    Dyspnea    Gout 09/08/2018   Heart attack ([place])    Heart murmur    Hypertension    Hypothyroidism    Macrocytic anemia    MDS (myelodysplastic syndrome) (HCC)    Moderate mitral stenosis    PAD (peripheral artery disease) (HCC)    PAF (paroxysmal atrial fibrillation) (HCC)     Pulmonary hypertension (HCC)    Renal artery stenosis (HCC)    a. >60% R renal artery stenosis by duplex 2021, managed medically.   Severe aortic stenosis    Stage 4 chronic kidney disease (HCC)    Transfusion-dependent anemia      Family History  Problem Relation Age of Onset   Hypertension Mother    Diabetes Mother    Cervical cancer Mother    Heart attack Father    Hypertension Sister    Hypertension Brother    Hypertension Sister    Hypertension Sister    Prostate cancer Brother    HIV/AIDS Brother      Current Outpatient Medications:    allopurinol (ZYLOPRIM) 300 MG tablet, Take 300 mg by mouth daily., Disp: , Rfl:    amiodarone (PACERONE) 200 MG tablet, Take 1 tablet (200 mg total) by mouth daily., Disp: 90 tablet, Rfl: 3   atorvastatin (LIPITOR) 40 MG tablet, Take 1 tablet by mouth once daily, Disp: 90 tablet, Rfl: 1   blood glucose meter kit and supplies, Dispense based on patient and insurance preference. Use up to four times daily as directed. (FOR ICD-10 E10.9, E11.9). (Patient taking differently: 1 each by Other route See admin instructions. Dispense based on patient and insurance preference. Use up to four times daily  as directed. (FOR ICD-10 E10.9, E11.9).), Disp: 1 each, Rfl: 3   Blood Glucose Monitoring Suppl (ONETOUCH ULTRALINK) w/Device KIT, Check blood sugars twice daily E11.9 (Patient taking differently: 1 each by Other route 2 (two) times daily. Check blood sugars twice daily E11.9), Disp: 1 kit, Rfl: 0   carvedilol (COREG) 3.125 MG tablet, Take 1 tablet (3.125 mg total) by mouth 2 (two) times daily., Disp: 60 tablet, Rfl: 3   cloNIDine (CATAPRES) 0.1 MG tablet, Take 1 tablet (0.1 mg total) by mouth 2 (two) times daily., Disp: 60 tablet, Rfl: 11   colchicine 0.6 MG tablet, Take 0.6 mg by mouth daily as needed (gout)., Disp: , Rfl:    fluticasone (FLONASE) 50 MCG/ACT nasal spray, Place 1 spray into both nostrils daily., Disp: 16 g, Rfl: 2   glucose blood test  strip, check blood sugars twice daily Dx code E11.22 (Patient taking differently: 1 each by Other route See admin instructions. check blood sugars twice daily Dx code E11.22), Disp: 100 each, Rfl: 3   hydrALAZINE (APRESOLINE) 100 MG tablet, Take 1 tablet (100 mg total) by mouth 3 (three) times daily., Disp: 90 tablet, Rfl: 11   Insulin Glargine (BASAGLAR KWIKPEN) 100 UNIT/ML, Inject 15 Units into the skin daily. Only if above 150, Disp: , Rfl:    Insulin Pen Needle 29G X 5MM MISC, Use as directed (Patient taking differently: 1 each by Other route as directed.), Disp: 200 each, Rfl: 0   Lancets (ONETOUCH DELICA PLUS IOXBDZ32D) MISC, check blood sugars twice daily Dx code: E11.22 (Patient taking differently: 1 each by Other route See admin instructions. check blood sugars twice daily Dx code: E11.22), Disp: 100 each, Rfl: 3   latanoprost (XALATAN) 0.005 % ophthalmic solution, Place 1 drop into both eyes at bedtime. , Disp: , Rfl:    levothyroxine (SYNTHROID) 150 MCG tablet, Take 1 tablet (150 mcg total) by mouth daily before breakfast., Disp: 30 tablet, Rfl: 2   linagliptin (TRADJENTA) 5 MG TABS tablet, Take 1 tablet (5 mg total) by mouth daily., Disp: 90 tablet, Rfl: 1   loratadine (CLARITIN) 10 MG tablet, Take 10 mg by mouth daily., Disp: , Rfl:    Multiple Vitamins-Minerals (CENTRUM SILVER 50+WOMEN) TABS, Take 1 tablet by mouth daily., Disp: , Rfl:    omeprazole (PRILOSEC) 40 MG capsule, TAKE 1 CAPSULE BY MOUTH TWICE DAILY BEFORE A MEAL, Disp: 180 capsule, Rfl: 0   oxyCODONE-acetaminophen (PERCOCET/ROXICET) 5-325 MG tablet, Take 1 tablet by mouth every 6 (six) hours as needed., Disp: 20 tablet, Rfl: 0   polyethylene glycol (MIRALAX / GLYCOLAX) 17 g packet, Take 17 g by mouth daily., Disp: 14 each, Rfl: 0   sildenafil (REVATIO) 20 MG tablet, Take 1 tablet (20 mg total) by mouth 3 (three) times daily., Disp: 90 tablet, Rfl: 3   sodium bicarbonate 650 MG tablet, 2 tablets, Disp: , Rfl:    Allergies   Allergen Reactions   Ancef [Cefazolin] Itching    Severe itching- after procedure, ancef was the antibiotic.-04/02/17 Tolerates penicillins   Lactose Intolerance (Gi) Diarrhea   Morphine And Related Itching   Sulfa Antibiotics Rash and Other (See Comments)    Blisters, also     Review of Systems  Constitutional: Negative.   Respiratory: Negative.    Cardiovascular: Negative.   Gastrointestinal: Negative.   Neurological: Negative.   Psychiatric/Behavioral: Negative.      Today's Vitals   12/04/20 1613  BP: 130/68  Pulse: 63  Temp: 98.4 F (36.9 C)  TempSrc: Oral  Weight: 200 lb 6.4 oz (90.9 kg)  Height: 5' 4.2" (1.631 m)   Body mass index is 34.18 kg/m.  She is encouraged to strive for BMI less than 30 to decrease cardiac risk. Advised to aim for at least 150 minutes of exercise per week.  Objective:  Physical Exam Constitutional:      Appearance: Normal appearance.  Pulmonary:     Effort: Pulmonary effort is normal. No respiratory distress.     Breath sounds: Normal breath sounds. No wheezing.  Neurological:     General: No focal deficit present.  Psychiatric:        Mood and Affect: Mood normal.        Behavior: Behavior normal.        Thought Content: Thought content normal.        Judgment: Judgment normal.        Assessment And Plan:     1. Symptomatic anemia TCM Performed. A member of the clinical team spoke with the patient upon dischare. Discharge summary was reviewed in full detail during the visit. Meds reconciled and compared to discharge meds. Medication list is updated and reviewed with the patient.  Greater than 50% face to face time was spent in counseling an coordination of care.  All questions were answered to the satisfaction of the patient.   She was administered 3 units PRBC due to Hgb down to 7.3 Report she is doing well since being discharged  2. Type 2 diabetes mellitus with stage 4 chronic kidney disease, with long-term current use of  insulin (HCC) Chronic, may need to change her basaglar dose if she is unable to get Tradjenta with patient assistance  3. Essential hypertension Chronic, fair control  4. Class 1 obesity due to excess calories with serious comorbidity and body mass index (BMI) of 34.0 to 34.9 in adult Chronic Discussed healthy diet and regular exercise options  Encouraged to exercise at least 150 minutes per week with 2 days of strength training  5. Acquired hypothyroidism Will check levels Pending labs will change medications - TSH - T4 - T3, free  6. Acute respiratory failure with hypoxia ([place]) She had a low Spo2 of 82% when in the hospital. This has resolved. At that time her Hgb was down to 7.2. This has increased to 8.4 at the [facility]  7. Renal cyst Incidental finding of a possible renal cyst to left kidney will order MRI of abdomen for better visualization   Patient was given opportunity to ask questions. Patient verbalized understanding of the plan and was able to repeat key elements of the plan. All questions were answered to their satisfaction.  [name], FNP   I, [name], FNP, have reviewed all documentation for this visit. The documentation on 12/21/20 for the exam, diagnosis, procedures, and orders are all accurate and complete.   IF YOU HAVE BEEN REFERRED TO A SPECIALIST, IT MAY TAKE 1-2 WEEKS TO SCHEDULE/PROCESS THE REFERRAL. IF YOU HAVE NOT HEARD FROM US/SPECIALIST IN TWO WEEKS, PLEASE GIVE [place] A CALL AT [phone].   THE PATIENT IS ENCOURAGED TO PRACTICE SOCIAL DISTANCING DUE TO THE COVID-19 PANDEMIC.

## 2020-12-04 NOTE — Patient Instructions (Signed)
Diabetes Mellitus and Exercise Exercising regularly is important for overall health, especially for people who have diabetes mellitus. Exercising is not only about losing weight. It has many other health benefits, such as increasing muscle strength and bone density and reducing body fat and stress. This leads to improved fitness, flexibility, and endurance, all of which result in better overall health. What are the benefits of exercise if I have diabetes? Exercise has many benefits for people with diabetes. They include:  Helping to lower and control blood sugar (glucose).  Helping the body to respond better to the hormone insulin by improving insulin sensitivity.  Reducing how much insulin the body needs.  Lowering the risk for heart disease by: ? Lowering "bad" cholesterol and triglyceride levels. ? Increasing "good" cholesterol levels. ? Lowering blood pressure. ? Lowering blood glucose levels. What is my activity plan? Your health care provider or certified diabetes educator can help you make a plan for the type and frequency of exercise that works for you. This is called your activity plan. Be sure to:  Get at least 150 minutes of medium-intensity or high-intensity exercise each week. Exercises may include brisk walking, biking, or water aerobics.  Do stretching and strengthening exercises, such as yoga or weight lifting, at least 2 times a week.  Spread out your activity over at least 3 days of the week.  Get some form of physical activity each day. ? Do not go more than 2 days in a row without some kind of physical activity. ? Avoid being inactive for more than 90 minutes at a time. Take frequent breaks to walk or stretch.  Choose exercises or activities that you enjoy. Set realistic goals.  Start slowly and gradually increase your exercise intensity over time.   How do I manage my diabetes during exercise? Monitor your blood glucose  Check your blood glucose before and  after exercising. If your blood glucose is: ? 240 mg/dL (13.3 mmol/L) or higher before you exercise, check your urine for ketones. These are chemicals created by the liver. If you have ketones in your urine, do not exercise until your blood glucose returns to normal. ? 100 mg/dL (5.6 mmol/L) or lower, eat a snack containing 15-20 grams of carbohydrate. Check your blood glucose 15 minutes after the snack to make sure that your glucose level is above 100 mg/dL (5.6 mmol/L) before you start your exercise.  Know the symptoms of low blood glucose (hypoglycemia) and how to treat it. Your risk for hypoglycemia increases during and after exercise. Follow these tips and your health care provider's instructions  Keep a carbohydrate snack that is fast-acting for use before, during, and after exercise to help prevent or treat hypoglycemia.  Avoid injecting insulin into areas of the body that are going to be exercised. For example, avoid injecting insulin into: ? Your arms, when you are about to play tennis. ? Your legs, when you are about to go jogging.  Keep records of your exercise habits. Doing this can help you and your health care provider adjust your diabetes management plan as needed. Write down: ? Food that you eat before and after you exercise. ? Blood glucose levels before and after you exercise. ? The type and amount of exercise you have done.  Work with your health care provider when you start a new exercise or activity. He or she may need to: ? Make sure that the activity is safe for you. ? Adjust your insulin, other medicines, and food that   you eat.  Drink plenty of water while you exercise. This prevents loss of water (dehydration) and problems caused by a lot of heat in the body (heat stroke).   Where to find more information  American Diabetes Association: www.diabetes.org Summary  Exercising regularly is important for overall health, especially for people who have diabetes  mellitus.  Exercising has many health benefits. It increases muscle strength and bone density and reduces body fat and stress. It also lowers and controls blood glucose.  Your health care provider or certified diabetes educator can help you make an activity plan for the type and frequency of exercise that works for you.  Work with your health care provider to make sure any new activity is safe for you. Also work with your health care provider to adjust your insulin, other medicines, and the food you eat. This information is not intended to replace advice given to you by your health care provider. Make sure you discuss any questions you have with your health care provider. Document Revised: 03/29/2019 Document Reviewed: 03/29/2019 Elsevier Patient Education  2021 Elsevier Inc.  

## 2020-12-05 ENCOUNTER — Telehealth: Payer: Self-pay

## 2020-12-05 ENCOUNTER — Other Ambulatory Visit: Payer: Self-pay | Admitting: Oncology

## 2020-12-05 LAB — TSH: TSH: 1.88 u[IU]/mL (ref 0.450–4.500)

## 2020-12-05 LAB — T3, FREE: T3, Free: 1.2 pg/mL — ABNORMAL LOW (ref 2.0–4.4)

## 2020-12-05 LAB — T4: T4, Total: 10.6 ug/dL (ref 4.5–12.0)

## 2020-12-05 NOTE — Progress Notes (Incomplete)
Chronic Care Management Pharmacy Note  12/05/2020 Name:  Patricia Mata MRN:  [id number] DOB:  12-28-1955  Subjective: Patricia Mata is an 65 y.o. year old female who is a primary patient of [name], [place].  The CCM team was consulted for assistance with disease management and care coordination needs.    {CCMTELEPHONEFACETOFACE:21091510} for {CCMINITIALFOLLOWUPCHOICE:21091511} in response to provider referral for pharmacy case management and/or care coordination services.   Consent to Services:  {CCMCONSENTOPTIONS:25074}  Patient Care Team: [name], FNP as PCP - General (General Practice) Lorretta Harp, MD as PCP - Cardiology (Cardiology) Bensimhon, Shaune Pascal, MD as PCP - Advanced Heart Failure (Cardiology) Mayford Knife, [facility] (Pharmacist) Center, [facility]  Recent office visits: ***  Recent consult visits: [facility] visits: {Hospital DC Yes/No:25215}  Objective:  Lab Results  Component Value Date   CREATININE 3.18 (H) 11/22/2020   BUN 67 (H) 11/22/2020   GFRNONAA 16 (L) 11/22/2020   GFRAA 16 (L) 09/05/2020   NA 133 (L) 11/22/2020   K 4.7 11/22/2020   CALCIUM 9.4 11/22/2020   CO2 24 11/22/2020   GLUCOSE 175 (H) 11/22/2020    Lab Results  Component Value Date/Time   HGBA1C 5.2 10/25/2020 01:01 PM   HGBA1C 6.0 (H) 08/10/2020 04:01 PM   MICROALBUR 80 06/22/2019 12:02 PM    Last diabetic Eye exam:  Lab Results  Component Value Date/Time   HMDIABEYEEXA No Retinopathy 08/08/2020 12:00 AM    Last diabetic Foot exam: No results found for: HMDIABFOOTEX   Lab Results  Component Value Date   CHOL 176 11/21/2020   HDL 88 11/21/2020   LDLCALC 71 11/21/2020   TRIG 85 11/21/2020   CHOLHDL 2.0 11/21/2020    Hepatic Function Latest Ref Rng & Units 11/22/2020 11/21/2020 10/27/2020  Total Protein 6.5 - 8.1 g/dL - 7.3 6.7  Albumin 3.5 - 5.0 g/dL 3.2(L) 3.8 3.6  AST 15 - 41 U/L - 59(H) 98(H)  ALT 0 - 44 U/L - 35 60(H)  Alk  Phosphatase 38 - 126 U/L - 160(H) 131(H)  Total Bilirubin 0.3 - 1.2 mg/dL - 1.1 0.8  Bilirubin, Direct 0.0 - 0.2 mg/dL - - -    Lab Results  Component Value Date/Time   TSH 1.880 12/04/2020 05:08 PM   TSH 3.550 09/05/2020 12:53 PM   FREET4 1.11 06/08/2020 02:33 AM   FREET4 1.49 04/19/2019 04:39 PM    CBC Latest Ref Rng & Units 11/30/2020 11/22/2020 11/21/2020  WBC 4.0 - 10.5 K/uL 4.4 5.8 -  Hemoglobin 12.0 - 15.0 g/dL 8.4(L) 7.6(L) 7.5(L)  Hematocrit 36.0 - 46.0 % 24.5(L) 21.8(L) 22.0(L)  Platelets 150 - 400 K/uL 149(L) 190 -    No results found for: VD25OH  Clinical ASCVD: Yes  The ASCVD Risk score [name], et al., 2013) failed to calculate for the following reasons:   The patient has a prior MI or stroke diagnosis    Depression screen [facility] 2/9 11/30/2020 07/26/2020 07/13/2020  Decreased Interest 0 0 0  Down, Depressed, Hopeless 0 0 0  PHQ - 2 Score 0 0 0  Altered sleeping - 0 0  Tired, decreased energy - 0 0  Change in appetite - 0 0  Feeling bad or failure about yourself  - 0 0  Trouble concentrating - 0 0  Moving slowly or fidgety/restless - 0 0  Suicidal thoughts - 0 0  PHQ-9 Score - 0 0  Some recent data might be hidden  Social History   Tobacco Use  Smoking Status Former Smoker  . Packs/day: 0.25  . Years: 20.00  . Pack years: 5.00  . Types: Cigarettes  . Quit date: 28  . Years since quitting: 25.4  Smokeless Tobacco Former User   BP Readings from Last 3 Encounters:  12/04/20 130/68  12/01/20 (!) 156/38  11/27/20 (!) 180/62   Pulse Readings from Last 3 Encounters:  12/04/20 63  12/01/20 62  11/27/20 80   Wt Readings from Last 3 Encounters:  12/04/20 200 lb 6.4 oz (90.9 kg)  11/30/20 200 lb (90.7 kg)  11/27/20 202 lb (91.6 kg)   BMI Readings from Last 3 Encounters:  12/04/20 34.18 kg/m  11/30/20 34.33 kg/m  11/27/20 34.67 kg/m    Assessment/Interventions: Review of patient past medical history, allergies, medications, health  status, including review of consultants reports, laboratory and other test data, was performed as part of comprehensive evaluation and provision of chronic care management services.   SDOH:  (Social Determinants of Health) assessments and interventions performed: {yes/no:20286}  SDOH Screenings   Alcohol Screen: Not on file  Depression (PHQ2-9): Low Risk   . PHQ-2 Score: 0  Financial Resource Strain: Low Risk   . Difficulty of Paying Living Expenses: Not hard at all  Food Insecurity: No Food Insecurity  . Worried About Charity fundraiser in the Last Year: Never true  . Ran Out of Food in the Last Year: Never true  Housing: Not on file  Physical Activity: Inactive  . Days of Exercise per Week: 0 days  . Minutes of Exercise per Session: 0 min  Social Connections: Not on file  Stress: No Stress Concern Present  . Feeling of Stress : Not at all  Tobacco Use: Medium Risk  . Smoking Tobacco Use: Former Smoker  . Smokeless Tobacco Use: Former Soil scientist Needs: No Transportation Needs  . Lack of Transportation (Medical): No  . Lack of Transportation (Non-Medical): No    CCM Care Plan  Allergies  Allergen Reactions  . Ancef [Cefazolin] Itching    Severe itching- after procedure, ancef was the antibiotic.-04/02/17 Tolerates penicillins  . Lactose Intolerance (Gi) Diarrhea  . Sulfa Antibiotics Rash and Other (See Comments)    Blisters, also    Medications Reviewed Today    Reviewed by [name], FNP (Family Nurse Practitioner) on 12/04/20 at 1701  Med List Status: <None>  Medication Order Taking? Sig Documenting Provider Last Dose Status Informant  allopurinol (ZYLOPRIM) 300 MG tablet [id number] No Take 300 mg by mouth daily. [provider] Taking Active Self  amiodarone (PACERONE) 200 MG tablet [id number] No Take 1 tablet (200 mg total) by mouth daily. Bensimhon, Shaune Pascal, MD Taking Active Self  atorvastatin (LIPITOR) 40 MG tablet [id number] No Take 1 tablet  by mouth once daily [name], FNP Taking Active Self  blood glucose meter kit and supplies [id number] No Dispense based on patient and insurance preference. Use up to four times daily as directed. (FOR ICD-10 E10.9, E11.9).  Patient taking differently: 1 each by Other route See admin instructions. Dispense based on patient and insurance preference. Use up to four times daily as directed. (FOR ICD-10 E10.9, E11.9).   [name], FNP Taking Active   Blood Glucose Monitoring Suppl [facility] Karsten Fells) w/Device Drucie Opitz [id number] No Check blood sugars twice daily E11.9  Patient taking differently: 1 each by Other route 2 (two) times daily. Check blood sugars twice daily E11.9   [name], FNP Taking Active  carvedilol (COREG) 3.125 MG tablet [id number] No Take 1 tablet (3.125 mg total) by mouth 2 (two) times daily. Darrick Grinder D, NP Taking Active   cloNIDine (CATAPRES) 0.1 MG tablet [id number] No Take 1 tablet (0.1 mg total) by mouth 2 (two) times daily. Bensimhon, Shaune Pascal, MD Taking Active Self  colchicine 0.6 MG tablet [id number] No Take 0.6 mg by mouth daily as needed (gout). [provider] Taking Active Self  fluticasone (FLONASE) 50 MCG/ACT nasal spray [id number] No Place 1 spray into both nostrils daily. Collene Gobble, MD Taking Active Self  glucose blood test strip [id number] No check blood sugars twice daily Dx code E11.22  Patient taking differently: 1 each by Other route See admin instructions. check blood sugars twice daily Dx code E11.22   [name], FNP Taking Active   hydrALAZINE (APRESOLINE) 100 MG tablet [id number] No Take 1 tablet (100 mg total) by mouth 3 (three) times daily. Lorretta Harp, MD Taking Active Self  Insulin Glargine [place]) 100 UNIT/ML [id number] No Inject 15 Units into the skin daily. Only if above 150 [provider] Taking Active Self  Insulin Pen Needle 29G X 5MM MISC [id number] No Use as directed  Patient taking differently: 1  each by Other route as directed.   Bonnielee Haff, MD Taking Active   Lancets (ONETOUCH DELICA PLUS [id number]) [place] [id number] No check blood sugars twice daily Dx code: E11.22  Patient taking differently: 1 each by Other route See admin instructions. check blood sugars twice daily Dx code: E11.22   [name], FNP Taking Active   latanoprost (XALATAN) 0.005 % ophthalmic solution [id number] No Place 1 drop into both eyes at bedtime.  [provider] Taking Active Self  levothyroxine (SYNTHROID) 150 MCG tablet [id number] No Take 1 tablet (150 mcg total) by mouth daily before breakfast. [name], FNP Taking Active Self  linagliptin (TRADJENTA) 5 MG TABS tablet [id number] No Take 1 tablet (5 mg total) by mouth daily. [name], FNP Taking Active Self  loratadine (CLARITIN) 10 MG tablet [id number] No Take 10 mg by mouth daily. [provider] Taking Active Self  Multiple Vitamins-Minerals (CENTRUM SILVER 50+WOMEN) TABS [id number] No Take 1 tablet by mouth daily. [provider] Taking Active Self  omeprazole (PRILOSEC) 40 MG capsule [id number]  TAKE 1 CAPSULE BY MOUTH TWICE DAILY BEFORE A MEAL [name], FNP  Active   polyethylene glycol (MIRALAX / GLYCOLAX) 17 g packet [id number] No Take 17 g by mouth daily. Barb Merino, MD Taking Active Self  sildenafil (REVATIO) 20 MG tablet [id number] No Take 1 tablet (20 mg total) by mouth 3 (three) times daily. Bensimhon, Shaune Pascal, MD Taking Active Self  sodium bicarbonate 650 MG tablet [id number] No 2 tablets [provider] Taking Active           Patient Active Problem List   Diagnosis Date Noted  . Obesity 11/23/2020  . Joint pain 11/23/2020  . Other long term (current) drug therapy 11/23/2020  . Primary osteoarthritis 11/23/2020  . ESRD (end stage renal disease) ([place]) 11/22/2020  . Nonrheumatic mitral (valve) stenosis 10/17/2020  . Coagulation defect, unspecified ([place]) 10/16/2020  . Complication of  vascular dialysis catheter 10/14/2020  . Peripheral vascular disease ([place]) 10/14/2020  . Unspecified protein-calorie malnutrition ([place]) 10/14/2020  . NSTEMI (non-ST elevated myocardial infarction) ([place]) 10/10/2020  . Angina pectoris ([place]) 10/09/2020  . Angina at [facility]) 10/09/2020  . Gastroesophageal reflux disease 09/12/2020  . History of malignant neoplasm of colon 09/12/2020  .  Chronic gastritis 09/02/2020  . Symptomatic anemia 08/31/2020  . Abnormal uterine bleeding (AUB) 07/19/2020  . Vaginal ulceration 07/19/2020  . Acute renal failure superimposed on stage 4 chronic kidney disease ([place])   . Palliative care by specialist   . Acute on chronic diastolic CHF (congestive heart failure) ([place]) 06/07/2020  . Chronic cough 05/04/2020  . Allergic rhinitis 03/23/2020  . Chronic kidney disease, stage 5 ([place]) 02/03/2020  . Acute diastolic CHF (congestive heart failure) ([place]) 09/21/2019  . HCAP (healthcare-associated pneumonia) 09/20/2019  . Pulmonary hypertension ([place]) 09/20/2019  . DM2 (diabetes mellitus, type 2) ([place]) 09/20/2019  . Elevated troponin 09/20/2019  . Severe sepsis ([place]) 09/20/2019  . CAP (community acquired pneumonia) 09/14/2019  . Epistaxis, recurrent 08/10/2019  . Neutropenia ([place]) 05/25/2019  . Malignant neoplasm of colon ([place]) 05/25/2019  . Bradycardia 03/21/2019  . Pneumonia due to COVID-19 virus 03/18/2019  . Type 2 diabetes mellitus with hemoglobin A1c goal of less than 7.0% ([place]) 12/10/2018  . Hypothyroidism 12/10/2018  . Seasonal allergies 12/10/2018  . Gout 09/08/2018  . 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 Sacral wound 06/08/2018  . Iron deficiency anemia due to chronic blood loss 06/08/2018  . Hyponatremia 06/08/2018  . Constipation 06/08/2018  . Encounter for nasogastric (NG) tube placement   . Pressure injury of skin 05/22/2018  . PAF (paroxysmal atrial fibrillation) ([place]) 05/11/2018  . Normocytic anemia 06/14/2017  . Aortic stenosis,  severe 06/10/2017  . Carotid artery disease ([place]) 06/10/2017  . Essential hypertension 05/13/2017  . Hyperlipidemia 05/13/2017  . Bilateral lower extremity edema 05/13/2017  . Port-A-Cath in place 04/28/2017  . MDS (myelodysplastic syndrome) ([place]) 03/20/2017  . Goals of care, counseling/discussion 03/20/2017  . Anemia in chronic kidney disease 05/10/2016  . Anemia in stage 1 chronic kidney disease 05/10/2016  . Anemia due to chronic kidney disease 02/09/2016    Immunization History  Administered Date(s) Administered  . Fluad Quad(high Dose 65+) 04/28/2020  . Influenza,inj,Quad PF,6+ Mos 05/09/2017, 09/15/2018, 04/13/2019  . Influenza-Unspecified 04/05/2016  . PFIZER(Purple Top)SARS-COV-2 Vaccination 09/11/2019, 01/10/2020, 04/07/2020  . Pneumococcal Conjugate-13 05/04/2020    Conditions to be addressed/monitored:  {USCCMDZASSESSMENTOPTIONS:23563}  There are no care plans that you recently modified to display for this patient.    Medication Assistance: {MEDASSISTANCEINFO:25044}  Patient's preferred pharmacy is:  [facility], [place] [address] Phone: [phone] Fax: [phone]  Uses pill box? {Yes or If no, why not?:20788} Pt endorses ***% compliance  We discussed: {Pharmacy options:24294} Patient decided to: {US Pharmacy Plan:23885}  Care Plan and Follow Up Patient Decision:  {FOLLOWUP:24991}  Plan: {CM FOLLOW UP PLAN:25073}  ***   Current Barriers:  . Unable to independently monitor therapeutic efficacy  Pharmacist Clinical Goal(s):  Marland Kitchen Patient will achieve adherence to monitoring guidelines and medication adherence to achieve therapeutic efficacy . contact provider office for questions/concerns as evidenced notation of same in electronic health record through collaboration with PharmD and provider.   Interventions: . 1:1 collaboration with [name], FNP regarding  development and update of comprehensive plan of care as evidenced by provider attestation and co-signature . Inter-disciplinary care team collaboration (see longitudinal plan of care) . Comprehensive medication review performed; medication list updated in electronic medical record  {CCM PHARMD DISEASE STATES:25130}  Patient Goals/Self-Care Activities . Patient will:  - {pharmacypatientgoals:24919}  Follow Up Plan: {CM FOLLOW UP VOHY:07371}

## 2020-12-07 ENCOUNTER — Telehealth (HOSPITAL_COMMUNITY): Payer: Self-pay | Admitting: Pharmacy Technician

## 2020-12-07 ENCOUNTER — Encounter: Payer: Self-pay | Admitting: Oncology

## 2020-12-07 ENCOUNTER — Other Ambulatory Visit (HOSPITAL_COMMUNITY): Payer: Self-pay

## 2020-12-07 NOTE — Telephone Encounter (Signed)
Patient Advocate Encounter   Received notification from [facility] that prior authorization for Sildenafil is required.   PA submitted on CoverMyMeds Key  [id number] Status is pending   Will continue to follow.

## 2020-12-08 ENCOUNTER — Inpatient Hospital Stay: Payer: Medicare Other

## 2020-12-08 ENCOUNTER — Other Ambulatory Visit (HOSPITAL_COMMUNITY): Payer: Self-pay

## 2020-12-08 ENCOUNTER — Other Ambulatory Visit: Payer: Self-pay

## 2020-12-08 DIAGNOSIS — D469 Myelodysplastic syndrome, unspecified: Secondary | ICD-10-CM

## 2020-12-08 LAB — CBC WITH DIFFERENTIAL (CANCER CENTER ONLY)
Abs Immature Granulocytes: 0.01 10*3/uL (ref 0.00–0.07)
Basophils Absolute: 0 10*3/uL (ref 0.0–0.1)
Basophils Relative: 1 %
Eosinophils Absolute: 0 10*3/uL (ref 0.0–0.5)
Eosinophils Relative: 1 %
HCT: 23.8 % — ABNORMAL LOW (ref 36.0–46.0)
Hemoglobin: 8.4 g/dL — ABNORMAL LOW (ref 12.0–15.0)
Immature Granulocytes: 0 %
Lymphocytes Relative: 14 %
Lymphs Abs: 0.4 10*3/uL — ABNORMAL LOW (ref 0.7–4.0)
MCH: 31.1 pg (ref 26.0–34.0)
MCHC: 35.3 g/dL (ref 30.0–36.0)
MCV: 88.1 fL (ref 80.0–100.0)
Monocytes Absolute: 0.8 10*3/uL (ref 0.1–1.0)
Monocytes Relative: 25 %
Neutro Abs: 1.9 10*3/uL (ref 1.7–7.7)
Neutrophils Relative %: 59 %
Platelet Count: 186 10*3/uL (ref 150–400)
RBC: 2.7 MIL/uL — ABNORMAL LOW (ref 3.87–5.11)
RDW: 19.9 % — ABNORMAL HIGH (ref 11.5–15.5)
WBC Count: 3.2 10*3/uL — ABNORMAL LOW (ref 4.0–10.5)
nRBC: 0 % (ref 0.0–0.2)

## 2020-12-08 LAB — SAMPLE TO BLOOD BANK

## 2020-12-08 NOTE — Telephone Encounter (Signed)
Advanced Heart Failure Patient Advocate Encounter  Prior Authorization for Sildenafil has been approved.     Effective dates: 12/07/20 through 12/07/21  Patients co-pay is $37 30 days, $111 90 days  Charlann Boxer, CPhT

## 2020-12-12 ENCOUNTER — Other Ambulatory Visit: Payer: Self-pay

## 2020-12-12 ENCOUNTER — Other Ambulatory Visit (HOSPITAL_COMMUNITY): Payer: Medicare Other

## 2020-12-12 ENCOUNTER — Other Ambulatory Visit (HOSPITAL_COMMUNITY)
Admission: RE | Admit: 2020-12-12 | Discharge: 2020-12-12 | Disposition: A | Discharge status: Home / Self Care | Payer: Medicare Other | Source: Ambulatory Visit | Attending: Vascular Surgery | Admitting: Vascular Surgery

## 2020-12-12 ENCOUNTER — Encounter (HOSPITAL_COMMUNITY): Payer: Self-pay | Admitting: Vascular Surgery

## 2020-12-12 DIAGNOSIS — Z01812 Encounter for preprocedural laboratory examination: Secondary | ICD-10-CM | POA: Insufficient documentation

## 2020-12-12 DIAGNOSIS — Z20822 Contact with and (suspected) exposure to covid-19: Secondary | ICD-10-CM | POA: Diagnosis not present

## 2020-12-12 DIAGNOSIS — N184 Chronic kidney disease, stage 4 (severe): Secondary | ICD-10-CM

## 2020-12-12 DIAGNOSIS — E1122 Type 2 diabetes mellitus with diabetic chronic kidney disease: Secondary | ICD-10-CM

## 2020-12-12 LAB — SARS CORONAVIRUS 2 (TAT 6-24 HRS): SARS Coronavirus 2: NEGATIVE

## 2020-12-12 MED ORDER — LINAGLIPTIN 5 MG PO TABS: 5.0000 mg | ORAL_TABLET | Freq: Every day | ORAL | 1 refills | Status: DC

## 2020-12-12 NOTE — Progress Notes (Addendum)
Anesthesia Chart Review:  Pt is a same day work up    Case: [id number] Date/Time: 12/13/20 0949   Procedures:      RIGHT UPPER EXTREMITY ARTERIOVENOUS FISTULA TRANSPOSITION (Right )     POSSIBLE LIGATION OF COMPETING BRANCHES OF RIGHT UPPER EXTREMITY ARTERIOVENOUS FISTULA (Right )   Anesthesia type: Choice   Pre-op diagnosis: ESRD   Location: [facility] OR ROOM 16 / [facility]   Surgeons: Waynetta Sandy, MD      DISCUSSION: Pt is 65 years old with hx severe aortic stenosis (not a candidate for TAVR, medical management only), moderate mitral stenosis, CAD (non-obstructive by 10/13/20 cath), chronic diastolic HF, atrial fibrillation (not on anticoagulation because of transfusion dependent anemia), HTN, pulmonary artery hypertension, DM, ESRD on hemodialysis, anemia of chronic disease/myelodysplastic syndrome (transfusion dependent, treatment is palliative only)  - CBC 12/08/20 showed H/H 8.4/23.8 (pt's baseline is 7-8; hem-onc recommends keeping it above 7) - Istat 8 ordered for day of surgery.   Hospitalizations in last 12 months: - Hospitalized 5/10-12/22 for acute respiratory distress with hypoxia due to volume overload due to missed hemodialysis. Congestive HF with preserved EF, anemia of chronic disease (received 1 unit PRBC to keep hgb >7.5)  - ED visit 10/27/20 for anemia (hgb 5.8). Received 2 units PRBCs  - Hospitalized 3/28-10/16/20 for NSTEMI (thought to be demand ischemia in the setting of severe anemia), anemia/myelodysplastic syndrome (hgb 4.9)  - Hospitalized 2/17-19/22 for severe anemia (hgb 6.4). No GI source of bleeding identified - pt will f/u with GI. Also evaluated by oncology due to hx colon cancer (no evidence of recurrence), myelodysplastic syndrome- recommend keeping hgb above 7. Treatment for myelodysplastic syndrome is palliative  - Hospitalized 12/23-26/21 for acute renal failure on stage 4 CKD, acute on chronic CHF, hyponatremia. Diuresed with lasix.   - Hospitalized  11/24-28/21 for acute hypoxemic respiratory failure due to chronic anemia, severe AS, acute on chronic diastolic HF  - No other hospitalizations in Epic previous 12 month period   Reviewed case with Dr. Ulla Potash:  - PCP is [name], FNP - Hem-onc is Zola Button, MD - Cardiologist is Quay Burow, MD.  - Heart failure cardiologist is Glori Bickers, MD. Last office visit 11/27/20 with Darrick Grinder, NP - Nephrologist is Dr. Resa Miner - Structural heart team: Sherren Mocha, MD   LABS: Will be obtained day of surgery  - CBC 12/08/20 showed H/H 8.4/23.8 (pt's baseline is 7-8). WBC count 3.2   IMAGES: [place] abd 12/01/20:  1. No acute abnormality of the abdomen. Unremarkable sonographic appearance of the liver and gallbladder. 2. Questionable 1.0 x 0.7 x 0.8 cm hypoechoic lesion in the lower pole the left kidney. This lesion does not have a clear correlate on prior CT or ultrasound examinations. A cannot be characterized as simple cysts on the current exam. Further evaluation with renal mass protocol CT or MRI should be considered.    EKG 11/21/20: Sinus rhythm with 1st degree A-V block. Prolonged QT. No significant change from prior per interpreting cardiologist   CV: R/L cardiac cath 10/13/20:   Mid RCA lesion is 20% stenosed.  Mid LM lesion is 60% stenosed.  Ost Cx to Prox Cx lesion is 30% stenosed.  Mid LAD lesion is 30% stenosed. - Findings: Ao = 152/38 (76) LV =  186/25 RA =  6 RV = 76/11 PA = 74/19 (43) PCW = unable to obtain Fick cardiac output/index = 9.1/4.6 PVR = 2.0 (using LVEDP) Ao sat = 96%  PA sat = 67%. 67%  AoV: Crossed easily with JR4 and J-wire. Peak-to-peak gradient 43mHg  Mean gradient 3107mG AVA 1.4cm2 -   Assessment: 1. Significant CAD with 60% mid LM disease. O/w non-obstructive CAD 2. EF 60% 3. Moderate to severe pulmonary HTN due to high output 4. Moderate to severe AS - Plan/Discussion: Results d/w TAVR team. Given unfavorable  anatomy, burden of comorbidites and need for alternative access, I suspect risks of TAVR outweigh possible benefits. Favor medical management.    Echo 10/09/20: 1. Left ventricular ejection fraction, by estimation, is 50 to 55%. The left ventricle has low normal function. The left ventricle has no regional wall motion abnormalities. The left ventricular internal cavity size was mildly dilated. Left ventricular diastolic parameters are indeterminate.  2. Right ventricular systolic function is mildly reduced. The right ventricular size is normal. There is severely elevated pulmonary artery systolic pressure. The estimated right ventricular systolic pressure is 8978.5mHg.  3. Left atrial size was mildly dilated.  4. Right atrial size was mildly dilated.  5. The mitral valve is degenerative. Trivial mitral valve regurgitation. Moderate mitral stenosis. The mean mitral valve gradient is 7.0 mmHg with MVA 1.5 cm^2 by VTI. Moderate to severe mitral annular calcification.  6. The aortic valve is tricuspid. Aortic valve regurgitation is mild. Severe aortic valve stenosis. Aortic valve area, by VTI measures 0.71 cm. Aortic valve mean gradient measures 65.0 mmHg.  7. The inferior vena cava is dilated in size with >50% respiratory variability, suggesting right atrial pressure of 8 mmHg.  8. The patient was in atrial fibrillation.    Past Medical History:  Diagnosis Date  . Acute renal failure (ARF) ([facility]11/25/2019  . Carotid artery disease (HCL[facility]  . Chronic diastolic (congestive) heart failure ([facility]  . Colon cancer ([facility]  . Diabetes mellitus (HCC[facility]  . Gout 09/08/2018  . Heart attack ([facility]  . Hypertension   . Hypothyroidism   . Macrocytic anemia   . MDS (myelodysplastic syndrome) ([facility]  . Moderate mitral stenosis   . PAD (peripheral artery disease) ([facility]  . PAF (paroxysmal atrial fibrillation) (HCEd[facility]  . Pulmonary hypertension (HCNewton  . Renal artery stenosis (HCC)    a. >60% R renal artery  stenosis by duplex 2021, managed medically.  . Severe aortic stenosis   . Stage 4 chronic kidney disease (HCStart  . Transfusion-dependent anemia     Past Surgical History:  Procedure Laterality Date  . AV FISTULA PLACEMENT Right 09/26/2020   Procedure: RIGHT ARM ARTERIOVENOUS (AV) FISTULA CREATION;  Surgeon: CaWaynetta SandyMD;  [facility];  Service: Vascular;  Laterality: Right;  Monitor Anesthesia Care with Regional Block to Right Arm   . COLON SURGERY    . DEBRIDMENT OF DECUBITUS ULCER N/A 06/12/2018   Procedure: DEBRIDMENT OF DECUBITUS ULCER;  Surgeon: DiWallace GoingDO;  Location: [facility] ORS;  Service: Plastics;  Laterality: N/A;  . ESOPHAGOGASTRODUODENOSCOPY N/A 09/01/2020   Procedure: ESOPHAGOGASTRODUODENOSCOPY (EGD);  Surgeon: MaClarene EssexMD;  [facility];  Service: Endoscopy;  Laterality: N/A;  . GIVENS CAPSULE STUDY N/A 09/01/2020   Procedure: GIVENS CAPSULE STUDY;  Surgeon: MaClarene EssexMD;  Location: [facility];  Service: Endoscopy;  Laterality: N/A;  . IR FLUORO GUIDE CV LINE RIGHT  10/11/2020  . IR FLUORO GUIDE PORT INSERTION RIGHT  04/02/2017  . IR REMOVAL TUN ACCESS W/ PORT W/O FL MOD SED  03/16/2019  . IR USK[place]UIDE VASC  ACCESS RIGHT  04/02/2017  . IR US GUIDE VASC ACCESS RIGHT  10/11/2020  . RIGHT HEART CATH N/A 04/23/2019   Procedure: RIGHT HEART CATH;  Surgeon: Jolaine Artist, MD;  Location: [place] CV LAB;  Service: Cardiovascular;  Laterality: N/A;  . RIGHT/LEFT HEART CATH AND CORONARY ANGIOGRAPHY N/A 05/21/2018   Procedure: RIGHT/LEFT HEART CATH AND CORONARY ANGIOGRAPHY;  Surgeon: Nelva Bush, MD;  Location: [place] CV LAB;  Service: Cardiovascular;  Laterality: N/A;  . RIGHT/LEFT HEART CATH AND CORONARY ANGIOGRAPHY N/A 10/13/2020   Procedure: RIGHT/LEFT HEART CATH AND CORONARY ANGIOGRAPHY;  Surgeon: Jolaine Artist, MD;  Location: [facility] CV LAB;  Service: Cardiovascular;  Laterality: N/A;  . TUBAL LIGATION       MEDICATIONS: No current facility-administered medications for this encounter.   Marland Kitchen allopurinol (ZYLOPRIM) 300 MG tablet  . amiodarone (PACERONE) 200 MG tablet  . atorvastatin (LIPITOR) 40 MG tablet  . blood glucose meter kit and supplies  . Blood Glucose Monitoring Suppl (ONETOUCH ULTRALINK) w/Device KIT  . carvedilol (COREG) 3.125 MG tablet  . cloNIDine (CATAPRES) 0.1 MG tablet  . colchicine 0.6 MG tablet  . fluticasone (FLONASE) 50 MCG/ACT nasal spray  . glucose blood test strip  . hydrALAZINE (APRESOLINE) 100 MG tablet  . Insulin Glargine (BASAGLAR KWIKPEN) 100 UNIT/ML  . Insulin Pen Needle 29G X 5MM MISC  . Lancets (ONETOUCH DELICA PLUS [id number]) MISC  . latanoprost (XALATAN) 0.005 % ophthalmic solution  . levothyroxine (SYNTHROID) 150 MCG tablet  . linagliptin (TRADJENTA) 5 MG TABS tablet  . loratadine (CLARITIN) 10 MG tablet  . Multiple Vitamins-Minerals (CENTRUM SILVER 50+WOMEN) TABS  . omeprazole (PRILOSEC) 40 MG capsule  . polyethylene glycol (MIRALAX / GLYCOLAX) 17 g packet  . sildenafil (REVATIO) 20 MG tablet  . sodium bicarbonate 650 MG tablet    Pt will need further evaluation day of procedure by assigned anesthesiologist. If stable and labs acceptable, I anticipate pt can proceed as scheduled.   Willeen Cass, PhD, FNP-BC [facility]/Anesthesiology Phone: [phone] 12/12/2020 11:02 AM

## 2020-12-12 NOTE — Progress Notes (Signed)
Mrs Greenley denies chest pain or shortness of breath.   Mrs. Shek has type II diabetes, Patient reports that CBGs run 110- 120. I instructed patient  to not take Trajenta in am.  I instructed patient if CBG is greater than 150 to take 7 units of Basagler Insulin. I instructed patient to check CBG after awaking and every 2 hours until arrival  to the hospital.  I Instructed patient if CBG is less than 70 to take 4 Glucose Tablets or 1 tube of Glucose Gel or 1/2 cup of a clear juice. Recheck CBG in 15 minutes if CBG is not over 70 call, pre- op desk at [phone] for further instructions. If scheduled to receive Insulin, do not take Insulin

## 2020-12-12 NOTE — Anesthesia Preprocedure Evaluation (Addendum)
Anesthesia Evaluation  Patient identified by MRN, date of birth, ID band Patient awake    Reviewed: Allergy & Precautions, NPO status , Patient's Chart, lab work & pertinent test results, reviewed documented beta blocker date and time   History of Anesthesia Complications Negative for: history of anesthetic complications  Airway Mallampati: II  TM Distance: >3 FB Neck ROM: Full    Dental  (+) Dental Advisory Given, Teeth Intact   Pulmonary former smoker,    Pulmonary exam normal        Cardiovascular hypertension, Pt. on home beta blockers and Pt. on medications pulmonary hypertension+ angina + Past MI, + Peripheral Vascular Disease and +CHF  Normal cardiovascular exam+ dysrhythmias Atrial Fibrillation + Valvular Problems/Murmurs (MS) AS    '22 TTE - EF 50 to 55%. LV internal cavity size was  mildly dilated. Right ventricular systolic function is mildly reduced. Severely elevated pulmonary artery systolic pressure. The estimated right ventricular systolic pressure is 82.5 mmHg. Left atrial size was mildly dilated.  Right atrial size was mildly dilated. Trivial mitral valve regurgitation. Moderate mitral stenosis. The mean mitral valve gradient is 7.0 mmHg with MVA 1.5 cm^2 by VTI. Aortic valve regurgitation is mild. Severe aortic valve stenosis. Aortic valve area, by VTI measures 0.71 cm.  Aortic valve mean gradient measures 65.0 mmHg.     Neuro/Psych negative neurological ROS  negative psych ROS   GI/Hepatic Neg liver ROS, GERD  Medicated and Controlled, Colon cancer    Endo/Other  diabetes, Insulin DependentHypothyroidism   Renal/GU ESRF and DialysisRenal disease RAS      Musculoskeletal  (+) Arthritis ,  Gout    Abdominal   Peds  Hematology  (+) anemia ,  MDS    Anesthesia Other Findings Covid test negative   Reproductive/Obstetrics                           Anesthesia  Physical Anesthesia Plan  ASA: IV  Anesthesia Plan: Regional   Post-op Pain Management:    Induction: Intravenous  PONV Risk Score and Plan: 2 and Propofol infusion and Treatment may vary due to age or medical condition  Airway Management Planned: Natural Airway and Simple Face Mask  Additional Equipment: None  Intra-op Plan:   Post-operative Plan:   Informed Consent: I have reviewed the patients History and Physical, chart, labs and discussed the procedure including the risks, benefits and alternatives for the proposed anesthesia with the patient or authorized representative who has indicated his/her understanding and acceptance.       Plan Discussed with: CRNA, Anesthesiologist and Surgeon  Anesthesia Plan Comments:       Anesthesia Quick Evaluation

## 2020-12-13 ENCOUNTER — Ambulatory Visit (HOSPITAL_COMMUNITY)
Admission: RE | Admit: 2020-12-13 | Discharge: 2020-12-13 | Disposition: A | Discharge status: Home / Self Care | Payer: Medicare Other | Attending: Vascular Surgery | Admitting: Vascular Surgery

## 2020-12-13 ENCOUNTER — Ambulatory Visit (HOSPITAL_COMMUNITY): Payer: Medicare Other | Admitting: Emergency Medicine

## 2020-12-13 ENCOUNTER — Encounter (HOSPITAL_COMMUNITY): Payer: Self-pay | Admitting: Vascular Surgery

## 2020-12-13 ENCOUNTER — Other Ambulatory Visit: Payer: Self-pay

## 2020-12-13 ENCOUNTER — Other Ambulatory Visit: Payer: Self-pay | Admitting: Nurse Practitioner

## 2020-12-13 ENCOUNTER — Encounter (HOSPITAL_COMMUNITY)
Admission: RE | Disposition: A | Discharge status: Home / Self Care | Payer: Self-pay | Source: Home / Self Care | Attending: Vascular Surgery

## 2020-12-13 DIAGNOSIS — I2510 Atherosclerotic heart disease of native coronary artery without angina pectoris: Secondary | ICD-10-CM | POA: Insufficient documentation

## 2020-12-13 DIAGNOSIS — E039 Hypothyroidism, unspecified: Secondary | ICD-10-CM | POA: Diagnosis not present

## 2020-12-13 DIAGNOSIS — Z992 Dependence on renal dialysis: Secondary | ICD-10-CM | POA: Diagnosis not present

## 2020-12-13 DIAGNOSIS — E1122 Type 2 diabetes mellitus with diabetic chronic kidney disease: Secondary | ICD-10-CM | POA: Diagnosis not present

## 2020-12-13 DIAGNOSIS — T82898A Other specified complication of vascular prosthetic devices, implants and grafts, initial encounter: Secondary | ICD-10-CM | POA: Insufficient documentation

## 2020-12-13 DIAGNOSIS — I5032 Chronic diastolic (congestive) heart failure: Secondary | ICD-10-CM | POA: Diagnosis not present

## 2020-12-13 DIAGNOSIS — I480 Paroxysmal atrial fibrillation: Secondary | ICD-10-CM | POA: Diagnosis not present

## 2020-12-13 DIAGNOSIS — Z794 Long term (current) use of insulin: Secondary | ICD-10-CM | POA: Diagnosis not present

## 2020-12-13 DIAGNOSIS — I272 Pulmonary hypertension, unspecified: Secondary | ICD-10-CM | POA: Insufficient documentation

## 2020-12-13 DIAGNOSIS — N186 End stage renal disease: Secondary | ICD-10-CM

## 2020-12-13 DIAGNOSIS — I080 Rheumatic disorders of both mitral and aortic valves: Secondary | ICD-10-CM | POA: Diagnosis not present

## 2020-12-13 DIAGNOSIS — D631 Anemia in chronic kidney disease: Secondary | ICD-10-CM | POA: Insufficient documentation

## 2020-12-13 DIAGNOSIS — D469 Myelodysplastic syndrome, unspecified: Secondary | ICD-10-CM | POA: Diagnosis not present

## 2020-12-13 DIAGNOSIS — N2889 Other specified disorders of kidney and ureter: Secondary | ICD-10-CM

## 2020-12-13 DIAGNOSIS — Z79899 Other long term (current) drug therapy: Secondary | ICD-10-CM | POA: Diagnosis not present

## 2020-12-13 DIAGNOSIS — I132 Hypertensive heart and chronic kidney disease with heart failure and with stage 5 chronic kidney disease, or end stage renal disease: Secondary | ICD-10-CM | POA: Insufficient documentation

## 2020-12-13 DIAGNOSIS — E1151 Type 2 diabetes mellitus with diabetic peripheral angiopathy without gangrene: Secondary | ICD-10-CM | POA: Insufficient documentation

## 2020-12-13 HISTORY — DX: Dyspnea, unspecified: R06.00

## 2020-12-13 HISTORY — PX: BASCILIC VEIN TRANSPOSITION: SHX5742

## 2020-12-13 HISTORY — DX: Cardiac murmur, unspecified: R01.1

## 2020-12-13 HISTORY — PX: LIGATION OF COMPETING BRANCHES OF ARTERIOVENOUS FISTULA: SHX5949

## 2020-12-13 LAB — GLUCOSE, CAPILLARY
Glucose-Capillary: 147 mg/dL — ABNORMAL HIGH (ref 70–99)
Glucose-Capillary: 153 mg/dL — ABNORMAL HIGH (ref 70–99)
Glucose-Capillary: 147 mg/dL — ABNORMAL HIGH (ref 70–99)

## 2020-12-13 SURGERY — TRANSPOSITION, VEIN, BASILIC
Anesthesia: Regional | Laterality: Right

## 2020-12-13 MED ORDER — LIDOCAINE-EPINEPHRINE (PF) 1 %-1:200000 IJ SOLN
INTRAMUSCULAR | Status: DC | PRN
  Administered 2020-12-13: 10 mL

## 2020-12-13 MED ORDER — FENTANYL CITRATE (PF) 100 MCG/2ML IJ SOLN: 25.0000 ug | INTRAMUSCULAR | Status: DC | PRN

## 2020-12-13 MED ORDER — OXYCODONE HCL 5 MG/5ML PO SOLN
5.0000 mg | Freq: Once | ORAL | Status: DC | PRN
Start: 2020-12-13 — End: 2020-12-13

## 2020-12-13 MED ORDER — FENTANYL CITRATE (PF) 100 MCG/2ML IJ SOLN
INTRAMUSCULAR | Status: DC | PRN
  Administered 2020-12-13: 50 ug via INTRAVENOUS

## 2020-12-13 MED ORDER — PROPOFOL 1000 MG/100ML IV EMUL
INTRAVENOUS | Status: AC
  Filled 2020-12-13: qty 100

## 2020-12-13 MED ORDER — CHLORHEXIDINE GLUCONATE 0.12 % MT SOLN
15.0000 mL | Freq: Once | OROMUCOSAL | Status: AC
  Administered 2020-12-13: 15 mL via OROMUCOSAL
  Filled 2020-12-13: qty 15

## 2020-12-13 MED ORDER — LIDOCAINE-EPINEPHRINE (PF) 1 %-1:200000 IJ SOLN
INTRAMUSCULAR | Status: AC
  Filled 2020-12-13: qty 30

## 2020-12-13 MED ORDER — PHENYLEPHRINE HCL-NACL 10-0.9 MG/250ML-% IV SOLN
INTRAVENOUS | Status: AC
  Filled 2020-12-13: qty 250

## 2020-12-13 MED ORDER — VANCOMYCIN HCL IN DEXTROSE 1-5 GM/200ML-% IV SOLN
1000.0000 mg | INTRAVENOUS | Status: AC
  Administered 2020-12-13: 1000 mg via INTRAVENOUS
  Filled 2020-12-13: qty 200

## 2020-12-13 MED ORDER — SODIUM CHLORIDE 0.9 % IV SOLN
INTRAVENOUS | Status: AC
  Filled 2020-12-13: qty 1.2

## 2020-12-13 MED ORDER — SODIUM CHLORIDE 0.9 % IV SOLN
INTRAVENOUS | Status: DC | PRN
  Administered 2020-12-13: 500 mL

## 2020-12-13 MED ORDER — OXYCODONE-ACETAMINOPHEN 5-325 MG PO TABS: 1.0000 | ORAL_TABLET | Freq: Four times a day (QID) | ORAL | 0 refills | Status: DC | PRN

## 2020-12-13 MED ORDER — ONDANSETRON HCL 4 MG/2ML IJ SOLN: 4.0000 mg | Freq: Once | INTRAMUSCULAR | Status: DC | PRN

## 2020-12-13 MED ORDER — 0.9 % SODIUM CHLORIDE (POUR BTL) OPTIME
TOPICAL | Status: DC | PRN
  Administered 2020-12-13: 1000 mL

## 2020-12-13 MED ORDER — MIDAZOLAM HCL 2 MG/2ML IJ SOLN: 2.0000 mg | Freq: Once | INTRAMUSCULAR | Status: AC

## 2020-12-13 MED ORDER — SODIUM CHLORIDE 0.9 % IV SOLN: INTRAVENOUS | Status: DC

## 2020-12-13 MED ORDER — FENTANYL CITRATE (PF) 100 MCG/2ML IJ SOLN: 50.0000 ug | Freq: Once | INTRAMUSCULAR | Status: AC

## 2020-12-13 MED ORDER — CHLORHEXIDINE GLUCONATE 4 % EX LIQD: 60.0000 mL | Freq: Once | CUTANEOUS | Status: DC

## 2020-12-13 MED ORDER — FENTANYL CITRATE (PF) 250 MCG/5ML IJ SOLN
INTRAMUSCULAR | Status: AC
  Filled 2020-12-13: qty 5

## 2020-12-13 MED ORDER — FENTANYL CITRATE (PF) 100 MCG/2ML IJ SOLN
INTRAMUSCULAR | Status: AC
  Administered 2020-12-13: 50 ug via INTRAVENOUS
  Filled 2020-12-13: qty 2

## 2020-12-13 MED ORDER — OXYCODONE HCL 5 MG PO TABS: 5.0000 mg | ORAL_TABLET | Freq: Once | ORAL | Status: DC | PRN

## 2020-12-13 MED ORDER — LIDOCAINE-EPINEPHRINE (PF) 1.5 %-1:200000 IJ SOLN
INTRAMUSCULAR | Status: DC | PRN
  Administered 2020-12-13: 30 mL via PERINEURAL

## 2020-12-13 MED ORDER — MIDAZOLAM HCL 2 MG/2ML IJ SOLN
INTRAMUSCULAR | Status: AC
  Administered 2020-12-13: 2 mg via INTRAVENOUS
  Filled 2020-12-13: qty 2

## 2020-12-13 MED ORDER — PROPOFOL 500 MG/50ML IV EMUL
INTRAVENOUS | Status: DC | PRN
  Administered 2020-12-13: 50 ug/kg/min via INTRAVENOUS

## 2020-12-13 MED ORDER — ORAL CARE MOUTH RINSE: 15.0000 mL | Freq: Once | OROMUCOSAL | Status: AC

## 2020-12-13 SURGICAL SUPPLY — 38 items
ADH SKN CLS APL DERMABOND .7 (GAUZE/BANDAGES/DRESSINGS) ×1
ADH SKN CLS LQ APL DERMABOND (GAUZE/BANDAGES/DRESSINGS) ×1
ARMBAND PINK RESTRICT EXTREMIT (MISCELLANEOUS) ×2 IMPLANT
CANISTER SUCT 3000ML PPV (MISCELLANEOUS) ×2 IMPLANT
CLIP LIGATING EXTRA MED SLVR (CLIP) ×2 IMPLANT
CLIP LIGATING EXTRA SM BLUE (MISCELLANEOUS) ×2 IMPLANT
CLIP VESOCCLUDE MED 6/CT (CLIP) ×2 IMPLANT
CLIP VESOCCLUDE SM WIDE 24/CT (CLIP) IMPLANT
CLIP VESOCCLUDE SM WIDE 6/CT (CLIP) ×2 IMPLANT
COVER PROBE W GEL 5X96 (DRAPES) ×2 IMPLANT
COVER WAND RF STERILE (DRAPES) ×2 IMPLANT
DERMABOND ADHESIVE PROPEN (GAUZE/BANDAGES/DRESSINGS) ×1
DERMABOND ADVANCED (GAUZE/BANDAGES/DRESSINGS) ×1
DERMABOND ADVANCED .7 DNX12 (GAUZE/BANDAGES/DRESSINGS) ×1 IMPLANT
DERMABOND ADVANCED .7 DNX6 (GAUZE/BANDAGES/DRESSINGS) IMPLANT
ELECT CAUTERY BLADE 6.4 (BLADE) ×1 IMPLANT
ELECT REM PT RETURN 9FT ADLT (ELECTROSURGICAL) ×2
ELECTRODE REM PT RTRN 9FT ADLT (ELECTROSURGICAL) ×1 IMPLANT
GLOVE BIO SURGEON STRL SZ7.5 (GLOVE) ×2 IMPLANT
GOWN STRL REUS W/ TWL LRG LVL3 (GOWN DISPOSABLE) ×2 IMPLANT
GOWN STRL REUS W/ TWL XL LVL3 (GOWN DISPOSABLE) ×1 IMPLANT
GOWN STRL REUS W/TWL LRG LVL3 (GOWN DISPOSABLE) ×4
GOWN STRL REUS W/TWL XL LVL3 (GOWN DISPOSABLE) ×2
KIT BASIN OR (CUSTOM PROCEDURE TRAY) ×2 IMPLANT
KIT TURNOVER KIT B (KITS) ×2 IMPLANT
NS IRRIG 1000ML POUR BTL (IV SOLUTION) ×2 IMPLANT
PACK CV ACCESS (CUSTOM PROCEDURE TRAY) ×2 IMPLANT
PAD ARMBOARD 7.5X6 YLW CONV (MISCELLANEOUS) ×4 IMPLANT
PENCIL BUTTON HOLSTER BLD 10FT (ELECTRODE) ×1 IMPLANT
SUT MNCRL AB 4-0 PS2 18 (SUTURE) ×4 IMPLANT
SUT PROLENE 6 0 BV (SUTURE) ×2 IMPLANT
SUT SILK 0 TIES 10X30 (SUTURE) ×2 IMPLANT
SUT SILK 2 0 SH (SUTURE) ×1 IMPLANT
SUT VIC AB 3-0 SH 27 (SUTURE) ×4
SUT VIC AB 3-0 SH 27X BRD (SUTURE) ×1 IMPLANT
TOWEL GREEN STERILE (TOWEL DISPOSABLE) ×2 IMPLANT
UNDERPAD 30X36 HEAVY ABSORB (UNDERPADS AND DIAPERS) ×2 IMPLANT
WATER STERILE IRR 1000ML POUR (IV SOLUTION) ×2 IMPLANT

## 2020-12-13 NOTE — Transfer of Care (Signed)
Immediate Anesthesia Transfer of Care Note  Patient: Patricia Mata  Procedure(s) Performed: RIGHT UPPER EXTREMITY ARTERIOVENOUS FISTULA TRANSPOSITION (Right ) LIGATION OF COMPETING BRANCHES OF RIGHT UPPER EXTREMITY ARTERIOVENOUS FISTULA (Right )  Patient Location: PACU  Anesthesia Type:MAC  Level of Consciousness: drowsy and patient cooperative  Airway & Oxygen Therapy: Patient Spontanous Breathing  Post-op Assessment: Report given to RN and Post -op Vital signs reviewed and stable  Post vital signs: Reviewed and stable  Last Vitals:  Vitals Value Taken Time  BP 132/44 12/13/20 1115  Temp    Pulse    Resp 17 12/13/20 1117  SpO2    Vitals shown include unvalidated device data.  Last Pain:  Vitals:   12/13/20 1000  PainSc: 0-No pain         Complications: No complications documented.

## 2020-12-13 NOTE — Op Note (Signed)
    Patient name: Patricia Mata MRN: [id number] DOB: [birth year] Sex: female  12/13/2020 Pre-operative Diagnosis: esrd Post-operative diagnosis:  Same Surgeon:  Erlene Quan C. Donzetta Matters, MD Assistant: Laurence Slate, PA Procedure Performed:   Revision of right arm AV fistula with branch ligation and superficialization  Indications: 65 year old female on dialysis via catheter.  She has a for staged cephalic vein fistula which has matured nicely but is too deep for cannulation.  She is now indicated for superficialization versus transposition.    An assistant was necessary to expedite the case.  Findings: Fistula measured approximately 8 mm throughout its course.  There were multiple branches which were divided.  After dividing the branches freeing up the entire fistula in the upper arm we are able to sit this just below the skin and closed some of the deep tissue underneath of the fistula and the skin over top and at completion there was a strong thrill.   Procedure:  The patient was identified in the holding area and taken to the operating where she is placed upon upper table and MAC anesthesia induced.  A preoperative block of been placed.  She was sterilely prepped draped in the right upper extremity antibiotics were ministered a timeout was called.  The block was checked was noted to be mostly intact but we did inject 1% lidocaine with epinephrine overlying the fistula which was marked with ultrasound.  2 incisions were then made.  We dissected out the entirety of the fistula in the upper arm dividing branches between clips and ties.  Once we had the fistula entirely mobilized we able to set it just below the skin.  I did not feel the need to transpose it.  I closed some deep tissue with interrupted Vicryl suture and then closed for Monocryl over top of the fistula and the 2 incisions.  Dermabond placed in the skin.  She was then awakened from anesthesia having tolerated procedure without any  complication.  All counts were correct at completion.   EBL: 20cc  Aiesha Leland C. Donzetta Matters, MD [facility] Office: [phone] Pager: [phone]

## 2020-12-13 NOTE — Anesthesia Postprocedure Evaluation (Signed)
Anesthesia Post Note  Patient: Patricia Mata  Procedure(s) Performed: RIGHT UPPER EXTREMITY ARTERIOVENOUS FISTULA TRANSPOSITION (Right ) LIGATION OF COMPETING BRANCHES OF RIGHT UPPER EXTREMITY ARTERIOVENOUS FISTULA (Right )     Patient location during evaluation: PACU Anesthesia Type: Regional Level of consciousness: awake and alert Pain management: pain level controlled Vital Signs Assessment: post-procedure vital signs reviewed and stable Respiratory status: spontaneous breathing, nonlabored ventilation and respiratory function stable Cardiovascular status: stable and blood pressure returned to baseline Anesthetic complications: no   No complications documented.  Last Vitals:  Vitals:   12/13/20 1145 12/13/20 1155  BP: (!) 130/41 (!) 138/42  Pulse: (!) 59 61  Resp: 16 16  Temp:  36.7 C  SpO2: 99% 96%    Last Pain:  Vitals:   12/13/20 1155  PainSc: 0-No pain                 Audry Pili

## 2020-12-13 NOTE — Progress Notes (Signed)
Orthopedic Tech Progress Note Patient Details:  Patricia Mata Oct 11, 1955 [id number] PACU RN called requesting an ARM SLING Ortho Devices Type of Ortho Device: Arm sling Ortho Device/Splint Interventions: Other (comment)   Post Interventions Patient Tolerated: Other (comment) Instructions Provided: Other (comment)   Janit Pagan 12/13/2020, 11:48 AM

## 2020-12-13 NOTE — Anesthesia Procedure Notes (Signed)
Procedure Name: MAC Date/Time: 12/13/2020 10:45 AM Performed by: Georgia Duff, CRNA Patient Re-evaluated:Patient Re-evaluated prior to induction Oxygen Delivery Method: Simple face mask

## 2020-12-13 NOTE — Discharge Instructions (Signed)
° °  [facility] ° °Discharge Instructions ° °AV Fistula or Graft Surgery for Dialysis Access ° °Please refer to the following instructions for your post-procedure care. Your surgeon or physician assistant will discuss any changes with you. ° °Activity ° °You may drive the day following your surgery, if you are comfortable and no longer taking prescription pain medication. Resume full activity as the soreness in your incision resolves. ° °Bathing/Showering ° °You may shower after you go home. Keep your incision dry for 48 hours. Do not soak in a bathtub, hot tub, or swim until the incision heals completely. You may not shower if you have a hemodialysis catheter. ° °Incision Care ° °Clean your incision with mild soap and water after 48 hours. Pat the area dry with a clean towel. You do not need a bandage unless otherwise instructed. Do not apply any ointments or creams to your incision. You may have skin glue on your incision. Do not peel it off. It will come off on its own in about one week. Your arm may swell a bit after surgery. To reduce swelling use pillows to elevate your arm so it is above your heart. Your doctor will tell you if you need to lightly wrap your arm with an ACE bandage. ° °Diet ° °Resume your normal diet. There are not special food restrictions following this procedure. In order to heal from your surgery, it is CRITICAL to get adequate nutrition. Your body requires vitamins, minerals, and protein. Vegetables are the best source of vitamins and minerals. Vegetables also provide the perfect balance of protein. Processed food has little nutritional value, so try to avoid this. ° °Medications ° °Resume taking all of your medications. If your incision is causing pain, you may take over-the counter pain relievers such as acetaminophen (Tylenol). If you were prescribed a stronger pain medication, please be aware these medications can cause nausea and constipation. Prevent  nausea by taking the medication with a snack or meal. Avoid constipation by drinking plenty of fluids and eating foods with high amount of fiber, such as fruits, vegetables, and grains. Do not take Tylenol if you are taking prescription pain medications. ° ° ° ° °Follow up °Your surgeon may want to see you in the office following your access surgery. If so, this will be arranged at the time of your surgery. ° °Please call us immediately for any of the following conditions: ° °Increased pain, redness, drainage (pus) from your incision site °Fever of 101 degrees or higher °Severe or worsening pain at your incision site °Hand pain or numbness. ° °Reduce your risk of vascular disease: ° °Stop smoking. If you would like help, call [facility] at 1-800-QUIT-NOW (1-800-784-8669) or [place] at [phone] ° °Manage your cholesterol °Maintain a desired weight °Control your diabetes °Keep your blood pressure down ° °Dialysis ° °It will take several weeks to several months for your new dialysis access to be ready for use. Your surgeon will determine when it is OK to use it. Your nephrologist will continue to direct your dialysis. You can continue to use your Permcath until your new access is ready for use. ° °If you have any questions, please call the office at [phone]. ° °

## 2020-12-13 NOTE — Anesthesia Procedure Notes (Signed)
Anesthesia Regional Block: Supraclavicular block   Pre-Anesthetic Checklist: ,, timeout performed, Correct Patient, Correct Site, Correct Laterality, Correct Procedure, Correct Position, site marked, Risks and benefits discussed,  Surgical consent,  Pre-op evaluation,  At surgeon's request and post-op pain management  Laterality: Right  Prep: chloraprep       Needles:  Injection technique: Single-shot  Needle Type: Echogenic Needle     Needle Length: 5cm  Needle Gauge: 21     Additional Needles:   Narrative:  Start time: 12/13/2020 9:44 AM End time: 12/13/2020 9:48 AM Injection made incrementally with aspirations every 5 mL.  Performed by: Personally  Anesthesiologist: Audry Pili, MD  Additional Notes: No pain on injection. No increased resistance to injection. Injection made in 5cc increments. Good needle visualization. Patient tolerated the procedure well.

## 2020-12-13 NOTE — Interval H&P Note (Signed)
History and Physical Interval Note:  12/13/2020 8:32 AM  Patricia Mata  has presented today for surgery, with the diagnosis of ESRD.  The various methods of treatment have been discussed with the patient and family. After consideration of risks, benefits and other options for treatment, the patient has consented to  Procedure(s): RIGHT UPPER EXTREMITY ARTERIOVENOUS FISTULA TRANSPOSITION (Right) POSSIBLE LIGATION OF COMPETING BRANCHES OF RIGHT UPPER EXTREMITY ARTERIOVENOUS FISTULA (Right) as a surgical intervention.  The patient's history has been reviewed, patient examined, no change in status, stable for surgery.  I have reviewed the patient's chart and labs.  Questions were answered to the patient's satisfaction.     Servando Snare

## 2020-12-14 ENCOUNTER — Encounter (HOSPITAL_COMMUNITY): Payer: Self-pay | Admitting: Vascular Surgery

## 2020-12-14 LAB — POCT I-STAT, CHEM 8
BUN: 37 mg/dL — ABNORMAL HIGH (ref 8–23)
Calcium, Ion: 1.16 mmol/L (ref 1.15–1.40)
Chloride: 94 mmol/L — ABNORMAL LOW (ref 98–111)
Creatinine, Ser: 3.4 mg/dL — ABNORMAL HIGH (ref 0.44–1.00)
Glucose, Bld: 146 mg/dL — ABNORMAL HIGH (ref 70–99)
HCT: 22 % — ABNORMAL LOW (ref 36.0–46.0)
Hemoglobin: 7.5 g/dL — ABNORMAL LOW (ref 12.0–15.0)
Potassium: 3.7 mmol/L (ref 3.5–5.1)
Sodium: 134 mmol/L — ABNORMAL LOW (ref 135–145)
TCO2: 31 mmol/L (ref 22–32)

## 2020-12-15 ENCOUNTER — Inpatient Hospital Stay: Payer: Medicare Other | Attending: Oncology

## 2020-12-15 ENCOUNTER — Other Ambulatory Visit: Payer: Self-pay | Admitting: *Deleted

## 2020-12-15 ENCOUNTER — Other Ambulatory Visit: Payer: Self-pay | Admitting: Hematology and Oncology

## 2020-12-15 ENCOUNTER — Inpatient Hospital Stay: Payer: Medicare Other

## 2020-12-15 ENCOUNTER — Other Ambulatory Visit: Payer: Self-pay

## 2020-12-15 VITALS — BP 142/37 | HR 60 | Temp 98.3°F | Resp 18

## 2020-12-15 DIAGNOSIS — Z79899 Other long term (current) drug therapy: Secondary | ICD-10-CM | POA: Insufficient documentation

## 2020-12-15 DIAGNOSIS — D649 Anemia, unspecified: Secondary | ICD-10-CM

## 2020-12-15 DIAGNOSIS — D469 Myelodysplastic syndrome, unspecified: Secondary | ICD-10-CM | POA: Diagnosis present

## 2020-12-15 LAB — CBC WITH DIFFERENTIAL (CANCER CENTER ONLY)
Abs Immature Granulocytes: 0.02 10*3/uL (ref 0.00–0.07)
Basophils Absolute: 0 10*3/uL (ref 0.0–0.1)
Basophils Relative: 0 %
Eosinophils Absolute: 0 10*3/uL (ref 0.0–0.5)
Eosinophils Relative: 1 %
HCT: 21 % — ABNORMAL LOW (ref 36.0–46.0)
Hemoglobin: 7.3 g/dL — ABNORMAL LOW (ref 12.0–15.0)
Immature Granulocytes: 0 %
Lymphocytes Relative: 11 %
Lymphs Abs: 0.6 10*3/uL — ABNORMAL LOW (ref 0.7–4.0)
MCH: 31.5 pg (ref 26.0–34.0)
MCHC: 34.8 g/dL (ref 30.0–36.0)
MCV: 90.5 fL (ref 80.0–100.0)
Monocytes Absolute: 1.1 10*3/uL — ABNORMAL HIGH (ref 0.1–1.0)
Monocytes Relative: 19 %
Neutro Abs: 4 10*3/uL (ref 1.7–7.7)
Neutrophils Relative %: 69 %
Platelet Count: 174 10*3/uL (ref 150–400)
RBC: 2.32 MIL/uL — ABNORMAL LOW (ref 3.87–5.11)
RDW: 20.4 % — ABNORMAL HIGH (ref 11.5–15.5)
WBC Count: 5.8 10*3/uL (ref 4.0–10.5)
nRBC: 0.5 % — ABNORMAL HIGH (ref 0.0–0.2)

## 2020-12-15 LAB — PREPARE RBC (CROSSMATCH)

## 2020-12-15 LAB — SAMPLE TO BLOOD BANK

## 2020-12-15 MED ORDER — ACETAMINOPHEN 325 MG PO TABS
650.0000 mg | ORAL_TABLET | Freq: Once | ORAL | Status: AC
  Administered 2020-12-15: 650 mg via ORAL

## 2020-12-15 MED ORDER — LUSPATERCEPT-AAMT 75 MG ~~LOC~~ SOLR
1.7500 mg/kg | Freq: Once | SUBCUTANEOUS | Status: AC
Start: 2020-12-15 — End: 2020-12-15
  Administered 2020-12-15: 150 mg via SUBCUTANEOUS
  Filled 2020-12-15: qty 3

## 2020-12-15 MED ORDER — SODIUM CHLORIDE 0.9 % IV SOLN
INTRAVENOUS | Status: DC
  Filled 2020-12-15: qty 250

## 2020-12-15 MED ORDER — DIPHENHYDRAMINE HCL 25 MG PO CAPS
25.0000 mg | ORAL_CAPSULE | Freq: Once | ORAL | Status: AC
  Administered 2020-12-15: 25 mg via ORAL

## 2020-12-15 MED ORDER — SODIUM CHLORIDE 0.9% IV SOLUTION
250.0000 mL | Freq: Once | INTRAVENOUS | Status: AC
  Administered 2020-12-15: 250 mL via INTRAVENOUS
  Filled 2020-12-15: qty 250

## 2020-12-15 MED ORDER — DIPHENHYDRAMINE HCL 25 MG PO CAPS
ORAL_CAPSULE | ORAL | Status: AC
  Filled 2020-12-15: qty 1

## 2020-12-15 MED ORDER — FUROSEMIDE 10 MG/ML IJ SOLN
INTRAMUSCULAR | Status: AC
  Filled 2020-12-15: qty 2

## 2020-12-15 MED ORDER — ACETAMINOPHEN 325 MG PO TABS
ORAL_TABLET | ORAL | Status: AC
  Filled 2020-12-15: qty 2

## 2020-12-15 MED ORDER — FUROSEMIDE 10 MG/ML IJ SOLN
20.0000 mg | Freq: Once | INTRAMUSCULAR | Status: AC
  Administered 2020-12-15: 20 mg via INTRAVENOUS

## 2020-12-15 NOTE — Patient Instructions (Signed)

## 2020-12-18 LAB — BPAM RBC
Blood Product Expiration Date: 202206212359
Blood Product Expiration Date: 202206222359
ISSUE DATE / TIME: 202206031153
ISSUE DATE / TIME: 202206031153
Unit Type and Rh: 600
Unit Type and Rh: 600

## 2020-12-18 LAB — TYPE AND SCREEN
ABO/RH(D): A NEG
Antibody Screen: NEGATIVE
Unit division: 0
Unit division: 0

## 2020-12-26 ENCOUNTER — Emergency Department (HOSPITAL_COMMUNITY): Payer: Medicare Other

## 2020-12-26 ENCOUNTER — Other Ambulatory Visit: Payer: Self-pay | Admitting: *Deleted

## 2020-12-26 ENCOUNTER — Emergency Department (HOSPITAL_COMMUNITY)
Admission: EM | Admit: 2020-12-26 | Discharge: 2020-12-26 | Disposition: A | Discharge status: Home / Self Care | Payer: Medicare Other | Attending: Emergency Medicine | Admitting: Emergency Medicine

## 2020-12-26 ENCOUNTER — Inpatient Hospital Stay: Payer: Medicare Other

## 2020-12-26 ENCOUNTER — Telehealth: Payer: Self-pay | Admitting: *Deleted

## 2020-12-26 ENCOUNTER — Other Ambulatory Visit: Payer: Self-pay

## 2020-12-26 ENCOUNTER — Encounter (HOSPITAL_COMMUNITY): Payer: Self-pay

## 2020-12-26 DIAGNOSIS — Z85038 Personal history of other malignant neoplasm of large intestine: Secondary | ICD-10-CM | POA: Insufficient documentation

## 2020-12-26 DIAGNOSIS — Z7984 Long term (current) use of oral hypoglycemic drugs: Secondary | ICD-10-CM | POA: Diagnosis not present

## 2020-12-26 DIAGNOSIS — Z87891 Personal history of nicotine dependence: Secondary | ICD-10-CM | POA: Insufficient documentation

## 2020-12-26 DIAGNOSIS — Z8616 Personal history of COVID-19: Secondary | ICD-10-CM | POA: Diagnosis not present

## 2020-12-26 DIAGNOSIS — Z79899 Other long term (current) drug therapy: Secondary | ICD-10-CM | POA: Diagnosis not present

## 2020-12-26 DIAGNOSIS — D649 Anemia, unspecified: Secondary | ICD-10-CM

## 2020-12-26 DIAGNOSIS — N186 End stage renal disease: Secondary | ICD-10-CM | POA: Diagnosis not present

## 2020-12-26 DIAGNOSIS — E039 Hypothyroidism, unspecified: Secondary | ICD-10-CM | POA: Insufficient documentation

## 2020-12-26 DIAGNOSIS — Z992 Dependence on renal dialysis: Secondary | ICD-10-CM | POA: Diagnosis not present

## 2020-12-26 DIAGNOSIS — I5033 Acute on chronic diastolic (congestive) heart failure: Secondary | ICD-10-CM | POA: Diagnosis not present

## 2020-12-26 DIAGNOSIS — I132 Hypertensive heart and chronic kidney disease with heart failure and with stage 5 chronic kidney disease, or end stage renal disease: Secondary | ICD-10-CM | POA: Diagnosis not present

## 2020-12-26 DIAGNOSIS — Z794 Long term (current) use of insulin: Secondary | ICD-10-CM | POA: Insufficient documentation

## 2020-12-26 DIAGNOSIS — E1122 Type 2 diabetes mellitus with diabetic chronic kidney disease: Secondary | ICD-10-CM | POA: Diagnosis not present

## 2020-12-26 DIAGNOSIS — R0789 Other chest pain: Secondary | ICD-10-CM | POA: Diagnosis present

## 2020-12-26 LAB — CBC WITH DIFFERENTIAL (CANCER CENTER ONLY)
Abs Immature Granulocytes: 0.02 10*3/uL (ref 0.00–0.07)
Basophils Absolute: 0 10*3/uL (ref 0.0–0.1)
Basophils Relative: 0 %
Eosinophils Absolute: 0 10*3/uL (ref 0.0–0.5)
Eosinophils Relative: 1 %
HCT: 19.5 % — ABNORMAL LOW (ref 36.0–46.0)
Hemoglobin: 6.9 g/dL — CL (ref 12.0–15.0)
Immature Granulocytes: 0 %
Lymphocytes Relative: 10 %
Lymphs Abs: 0.6 10*3/uL — ABNORMAL LOW (ref 0.7–4.0)
MCH: 31.1 pg (ref 26.0–34.0)
MCHC: 35.4 g/dL (ref 30.0–36.0)
MCV: 87.8 fL (ref 80.0–100.0)
Monocytes Absolute: 1.1 10*3/uL — ABNORMAL HIGH (ref 0.1–1.0)
Monocytes Relative: 20 %
Neutro Abs: 3.7 10*3/uL (ref 1.7–7.7)
Neutrophils Relative %: 69 %
Platelet Count: 181 10*3/uL (ref 150–400)
RBC: 2.22 MIL/uL — ABNORMAL LOW (ref 3.87–5.11)
RDW: 18.4 % — ABNORMAL HIGH (ref 11.5–15.5)
WBC Count: 5.4 10*3/uL (ref 4.0–10.5)
nRBC: 1.1 % — ABNORMAL HIGH (ref 0.0–0.2)

## 2020-12-26 LAB — SAMPLE TO BLOOD BANK

## 2020-12-26 LAB — RETICULOCYTES
Immature Retic Fract: 3.5 % (ref 2.3–15.9)
RBC.: 2.23 MIL/uL — ABNORMAL LOW (ref 3.87–5.11)
Retic Count, Absolute: 31.9 10*3/uL (ref 19.0–186.0)
Retic Ct Pct: 1.4 % (ref 0.4–3.1)

## 2020-12-26 LAB — PREPARE RBC (CROSSMATCH)

## 2020-12-26 MED ORDER — SODIUM CHLORIDE 0.9 % IV SOLN
10.0000 mL/h | Freq: Once | INTRAVENOUS | Status: AC
  Administered 2020-12-26: 10 mL/h via INTRAVENOUS

## 2020-12-26 MED ORDER — FUROSEMIDE 40 MG PO TABS
20.0000 mg | ORAL_TABLET | Freq: Once | ORAL | Status: AC
  Administered 2020-12-26: 20 mg via ORAL
  Filled 2020-12-26: qty 1

## 2020-12-26 MED ORDER — SIMETHICONE 80 MG PO CHEW
80.0000 mg | CHEWABLE_TABLET | Freq: Once | ORAL | Status: AC
  Administered 2020-12-26: 80 mg via ORAL
  Filled 2020-12-26: qty 1

## 2020-12-26 MED ORDER — ACETAMINOPHEN 500 MG PO TABS
1000.0000 mg | ORAL_TABLET | Freq: Once | ORAL | Status: AC
  Administered 2020-12-26: 1000 mg via ORAL
  Filled 2020-12-26: qty 2

## 2020-12-26 NOTE — ED Triage Notes (Signed)
Pt states this morning before going to dialysis she had some chest pain and left arm pt. Pt states that these same symptoms occurred before when her Hbg was low. After dialysis, pt came to the cancer center for a blood draw and her Hbg was 6.9. Per [facility] nurse they were unable to transfuse her over there and the pt did not want to wait until tomorrow morning for the transfusion. Pt states that her symptoms have since resolved. Pt is A&Ox4.   **No blood draws or BP in left arm**

## 2020-12-26 NOTE — ED Provider Notes (Signed)
The patient completed 2 units of packed red blood cell transfusion.  Her vital signs of remained stable.  Okay for discharge   Wyvonnia Dusky, MD 12/26/20 2338

## 2020-12-26 NOTE — ED Provider Notes (Signed)
[facility] DEPT Provider Note   CSN: [id number] Arrival date & time: 12/26/20  1439     History Chief Complaint  Patient presents with   Abnormal Lab    Patricia Mata is a 65 y.o. female.  HPI     65 year old female comes in with chief complaint of abnormal lab.  Patient has myelodysplastic syndrome, diabetes, ESRD on HD.  Every 2 or 3 weeks she requires blood transfusion for symptomatic anemia.  Yesterday, patient started having some chest discomfort.  Today she went to the oncology center to get her CBC checked, suspicious that she might be anemic, and the hemoglobin came back less than 7.  Patient was advised to come to the ER for emergent transfusion for her symptomatic anemia.  Review of system is positive for dizziness and weakness as well.  Past Medical History:  Diagnosis Date   Acute renal failure (ARF) ([place]) 06/08/2018   TTHS- [place]   Carotid artery disease [facility])    Chronic diastolic (congestive) heart failure (HCC)    Colon cancer ([place]) 2008   Diabetes mellitus ([place])    Dyspnea    Gout 09/08/2018   Heart attack ([place])    Heart murmur    Hypertension    Hypothyroidism    Macrocytic anemia    MDS (myelodysplastic syndrome) (HCC)    Moderate mitral stenosis    PAD (peripheral artery disease) (HCC)    PAF (paroxysmal atrial fibrillation) (HCC)    Pulmonary hypertension (HCC)    Renal artery stenosis (HCC)    a. >60% R renal artery stenosis by duplex 2021, managed medically.   Severe aortic stenosis    Stage 4 chronic kidney disease (HCC)    Transfusion-dependent anemia     Patient Active Problem List   Diagnosis Date Noted   Obesity 11/23/2020   Joint pain 11/23/2020   Other long term (current) drug therapy 11/23/2020   Primary osteoarthritis 11/23/2020   ESRD (end stage renal disease) ([place]) 11/22/2020   Nonrheumatic mitral (valve) stenosis 10/17/2020   Coagulation defect, unspecified ([place]) 31/49/7026    Complication of vascular dialysis catheter 10/14/2020   Peripheral vascular disease ([place]) 10/14/2020   Unspecified protein-calorie malnutrition ([place]) 10/14/2020   NSTEMI (non-ST elevated myocardial infarction) ([place]) 10/10/2020   Angina pectoris ([place]) 10/09/2020   Angina at rest ([place]) 10/09/2020   Gastroesophageal reflux disease 09/12/2020   History of malignant neoplasm of colon 09/12/2020   Chronic gastritis 09/02/2020   Symptomatic anemia 08/31/2020   Abnormal uterine bleeding (AUB) 07/19/2020   Vaginal ulceration 07/19/2020   Acute renal failure superimposed on stage 4 chronic kidney disease ([place])    Palliative care by specialist    Acute on chronic diastolic CHF (congestive heart failure) ([place]) 06/07/2020   Chronic cough 05/04/2020   Allergic rhinitis 03/23/2020   Chronic kidney disease, stage 5 ([place]) 37/85/8850   Acute diastolic CHF (congestive heart failure) ([place]) 09/21/2019   HCAP (healthcare-associated pneumonia) 09/20/2019   Pulmonary hypertension ([place]) 09/20/2019   DM2 (diabetes mellitus, type 2) ([place]) 09/20/2019   Elevated troponin 09/20/2019   Severe sepsis ([place]) 09/20/2019   CAP (community acquired pneumonia) 09/14/2019   Epistaxis, recurrent 08/10/2019   Neutropenia ([place]) 05/25/2019   Malignant neoplasm of colon ([place]) 05/25/2019   Bradycardia 03/21/2019   Pneumonia due to COVID-19 virus 03/18/2019   Type 2 diabetes mellitus with hemoglobin A1c goal of less than 7.0% ([place]) 12/10/2018   Hypothyroidism 12/10/2018   Seasonal allergies 12/10/2018   Gout 09/08/2018  Bacterial infection due to Morganella morganii    Sacral wound 06/08/2018   Iron deficiency anemia due to chronic blood loss 06/08/2018   Hyponatremia 06/08/2018   Constipation 06/08/2018   Encounter for nasogastric (NG) tube placement    Pressure injury of skin 05/22/2018   PAF (paroxysmal atrial fibrillation) ([place]) 05/11/2018   Normocytic anemia 06/14/2017   Aortic stenosis, severe 06/10/2017    Carotid artery disease ([place]) 06/10/2017   Essential hypertension 05/13/2017   Hyperlipidemia 05/13/2017   Bilateral lower extremity edema 05/13/2017   Port-A-Cath in place 04/28/2017   MDS (myelodysplastic syndrome) ([place]) 03/20/2017   Goals of care, counseling/discussion 03/20/2017   Anemia in chronic kidney disease 05/10/2016   Anemia in stage 1 chronic kidney disease 05/10/2016   Anemia due to chronic kidney disease 02/09/2016    Past Surgical History:  Procedure Laterality Date   AV FISTULA PLACEMENT Right 09/26/2020   Procedure: RIGHT ARM ARTERIOVENOUS (AV) FISTULA CREATION;  Surgeon: Waynetta Sandy, MD;  Location: [facility];  Service: Vascular;  Laterality: Right;  Monitor Anesthesia Care with Regional Block to Right Arm    [place] Right 12/13/2020   Procedure: RIGHT UPPER EXTREMITY ARTERIOVENOUS FISTULA TRANSPOSITION;  Surgeon: Waynetta Sandy, MD;  Location: [facility];  Service: Vascular;  Laterality: Right;   COLON SURGERY     DEBRIDMENT OF DECUBITUS ULCER N/A 06/12/2018   Procedure: DEBRIDMENT OF DECUBITUS ULCER;  Surgeon: Wallace Going, DO;  Location: [facility];  Service: Plastics;  Laterality: N/A;   ESOPHAGOGASTRODUODENOSCOPY N/A 09/01/2020   Procedure: ESOPHAGOGASTRODUODENOSCOPY (EGD);  Surgeon: Clarene Essex, MD;  Location: [facility];  Service: Endoscopy;  Laterality: N/A;   GIVENS CAPSULE STUDY N/A 09/01/2020   Procedure: GIVENS CAPSULE STUDY;  Surgeon: Clarene Essex, MD;  Location: [facility];  Service: Endoscopy;  Laterality: N/A;   IR FLUORO GUIDE CV LINE RIGHT  10/11/2020   IR FLUORO GUIDE PORT INSERTION RIGHT  04/02/2017   IR REMOVAL TUN ACCESS W/ PORT W/O FL MOD SED  03/16/2019   IR US GUIDE VASC ACCESS RIGHT  04/02/2017   IR US GUIDE VASC ACCESS RIGHT  10/11/2020   LIGATION OF COMPETING BRANCHES OF ARTERIOVENOUS FISTULA Right 12/13/2020   Procedure: LIGATION OF COMPETING BRANCHES OF RIGHT UPPER EXTREMITY ARTERIOVENOUS FISTULA;  Surgeon:  Waynetta Sandy, MD;  Location: [facility];  Service: Vascular;  Laterality: Right;   RIGHT HEART CATH N/A 04/23/2019   Procedure: RIGHT HEART CATH;  Surgeon: Jolaine Artist, MD;  Location: [facility];  Service: Cardiovascular;  Laterality: N/A;   RIGHT/LEFT HEART CATH AND CORONARY ANGIOGRAPHY N/A 05/21/2018   Procedure: RIGHT/LEFT HEART CATH AND CORONARY ANGIOGRAPHY;  Surgeon: Nelva Bush, MD;  Location: [facility];  Service: Cardiovascular;  Laterality: N/A;   RIGHT/LEFT HEART CATH AND CORONARY ANGIOGRAPHY N/A 10/13/2020   Procedure: RIGHT/LEFT HEART CATH AND CORONARY ANGIOGRAPHY;  Surgeon: Jolaine Artist, MD;  Location: [facility];  Service: Cardiovascular;  Laterality: N/A;   TUBAL LIGATION       OB History   No obstetric history on file.     Family History  Problem Relation Age of Onset   Hypertension Mother    Diabetes Mother    Cervical cancer Mother    Heart attack Father    Hypertension Sister    Hypertension Brother    Hypertension Sister    Hypertension Sister    Prostate cancer Brother    HIV/AIDS Brother     Social History  Tobacco Use   Smoking status: Former    Packs/day: 0.25    Years: 20.00    Pack years: 5.00    Types: Cigarettes    Quit date: 1997    Years since quitting: 25.4   Smokeless tobacco: Former  Scientific laboratory technician Use: Never used  Substance Use Topics   Alcohol use: Yes    Alcohol/week: 1.0 standard drink    Types: 1 Standard drinks or equivalent per week    Comment: occ   Drug use: Never    Home Medications Prior to Admission medications   Medication Sig Start Date End Date Taking? Authorizing Provider  allopurinol (ZYLOPRIM) 300 MG tablet Take 300 mg by mouth daily. 07/05/20  Yes [provider]  amiodarone (PACERONE) 200 MG tablet Take 1 tablet (200 mg total) by mouth daily. 07/19/20  Yes Bensimhon, Shaune Pascal, MD  atorvastatin (LIPITOR) 40 MG tablet Take 1 tablet by mouth once daily  11/13/20  Yes [name], FNP  carvedilol (COREG) 3.125 MG tablet Take 1 tablet (3.125 mg total) by mouth 2 (two) times daily. 11/27/20  Yes Clegg, Amy D, NP  cloNIDine (CATAPRES) 0.1 MG tablet Take 1 tablet (0.1 mg total) by mouth 2 (two) times daily. 07/03/20  Yes Bensimhon, Shaune Pascal, MD  colchicine 0.6 MG tablet Take 0.6 mg by mouth daily as needed (gout).   Yes [provider]  fluticasone (FLONASE) 50 MCG/ACT nasal spray Place 1 spray into both nostrils daily. Patient taking differently: Place 1 spray into both nostrils daily as needed for allergies. 01/28/20  Yes Collene Gobble, MD  hydrALAZINE (APRESOLINE) 100 MG tablet Take 1 tablet (100 mg total) by mouth 3 (three) times daily. 09/27/20  Yes Lorretta Harp, MD  Insulin Glargine [facility] KWIKPEN) 100 UNIT/ML Inject 15 Units into the skin daily. Only if above 150   Yes [provider]  latanoprost (XALATAN) 0.005 % ophthalmic solution Place 1 drop into both eyes at bedtime.    Yes [provider]  levothyroxine (SYNTHROID) 150 MCG tablet Take 1 tablet (150 mcg total) by mouth daily before breakfast. 09/07/20  Yes [name], FNP  linagliptin (TRADJENTA) 5 MG TABS tablet Take 1 tablet (5 mg total) by mouth daily. 12/12/20  Yes [name], FNP  Multiple Vitamins-Minerals (CENTRUM SILVER 50+WOMEN) TABS Take 1 tablet by mouth daily.   Yes [provider]  omeprazole (PRILOSEC) 40 MG capsule TAKE 1 CAPSULE BY MOUTH TWICE DAILY BEFORE A MEAL 12/04/20  Yes [name], FNP  polyethylene glycol (MIRALAX / GLYCOLAX) 17 g packet Take 17 g by mouth daily. Patient taking differently: Take 17 g by mouth daily as needed for moderate constipation. 10/16/20  Yes Barb Merino, MD  sildenafil (REVATIO) 20 MG tablet Take 1 tablet (20 mg total) by mouth 3 (three) times daily. 09/06/20  Yes Bensimhon, Shaune Pascal, MD  sodium bicarbonate 650 MG tablet Take 1,300 mg by mouth 2 (two) times daily.   Yes [provider]   blood glucose meter kit and supplies Dispense based on patient and insurance preference. Use up to four times daily as directed. (FOR ICD-10 E10.9, E11.9). Patient taking differently: 1 each by Other route See admin instructions. Dispense based on patient and insurance preference. Use up to four times daily as directed. (FOR ICD-10 E10.9, E11.9). 11/04/18   [name], FNP  Blood Glucose Monitoring Suppl (ONETOUCH ULTRALINK) w/Device KIT Check blood sugars twice daily E11.9 Patient taking differently: 1 each by Other  route 2 (two) times daily. Check blood sugars twice daily E11.9 11/30/19   [name], FNP  glucose blood test strip check blood sugars twice daily Dx code E11.22 Patient taking differently: 1 each by Other route See admin instructions. check blood sugars twice daily Dx code E11.22 03/02/20   [name], FNP  Insulin Pen Needle 29G X 5MM MISC Use as directed Patient taking differently: 1 each by Other route as directed. 06/16/17   Bonnielee Haff, MD  Lancets (ONETOUCH DELICA PLUS [id number]) [place] check blood sugars twice daily Dx code: E11.22 Patient taking differently: 1 each by Other route See admin instructions. check blood sugars twice daily Dx code: E11.22 03/02/20   [name], FNP  oxyCODONE-acetaminophen (PERCOCET/ROXICET) 5-325 MG tablet Take 1 tablet by mouth every 6 (six) hours as needed. Patient not taking: Reported on 12/26/2020 12/13/20   Ulyses Amor, PA-C    Allergies    Ancef [cefazolin], Lactose intolerance (gi), Morphine and related, and Sulfa antibiotics  Review of Systems   Review of Systems  Constitutional:  Positive for activity change.  Respiratory:  Negative for shortness of breath.   Cardiovascular:  Positive for chest pain.  Neurological:  Positive for dizziness.  Hematological:  Does not bruise/bleed easily.  All other systems reviewed and are negative.  Physical Exam Updated Vital Signs BP (!) 151/51   Pulse 76   Temp 98.9 F (37.2 C)  (Oral)   Resp 14   Ht '5\' 4"'  (1.626 m)   Wt 89.4 kg   SpO2 100%   BMI 33.81 kg/m   Physical Exam Vitals and nursing note reviewed.  Constitutional:      Appearance: She is well-developed.  HENT:     Head: Normocephalic and atraumatic.  Eyes:     Pupils: Pupils are equal, round, and reactive to light.  Cardiovascular:     Rate and Rhythm: Normal rate and regular rhythm.     Heart sounds: Normal heart sounds.  Pulmonary:     Effort: Pulmonary effort is normal. No respiratory distress.  Abdominal:     General: There is no distension.     Palpations: Abdomen is soft.     Tenderness: There is no abdominal tenderness. There is no guarding or rebound.  Musculoskeletal:     Cervical back: Neck supple.  Skin:    General: Skin is warm and dry.  Neurological:     Mental Status: She is alert and oriented to person, place, and time.    ED Results / Procedures / Treatments   Labs (all labs ordered are listed, but only abnormal results are displayed) Labs Reviewed  RETICULOCYTES - Abnormal; Notable for the following components:      Result Value   RBC. 2.23 (*)    All other components within normal limits  PREPARE RBC (CROSSMATCH)  TYPE AND SCREEN    EKG EKG Interpretation  Date/Time:  Tuesday December 26 2020 15:40:31 EDT Ventricular Rate:  76 PR Interval:  171 QRS Duration: 85 QT Interval:  449 QTC Calculation: 505 R Axis:   33 Text Interpretation: Sinus rhythm Borderline T abnormalities, diffuse leads Prolonged QT interval No acute changes No significant change since last tracing Confirmed by Varney Biles ([id number]) on 12/26/2020 3:47:45 PM  Radiology DG Chest Port 1 View  Result Date: 12/26/2020 CLINICAL DATA:  Chest pain EXAM: PORTABLE CHEST 1 VIEW COMPARISON:  11/21/2020 FINDINGS: Cardiac shadow is mildly enlarged but stable. Aortic calcifications are again seen. Right jugular catheter is noted in  satisfactory position. Previously seen vascular congestion has resolved in  the interval. No focal infiltrate is seen. No acute bony abnormality is noted. IMPRESSION: No acute abnormality seen. Electronically Signed   By: Inez Catalina M.D.   On: 12/26/2020 15:43    Procedures .Critical Care  Date/Time: 12/26/2020 5:48 PM Performed by: Varney Biles, MD Authorized by: Varney Biles, MD   Critical care provider statement:    Critical care time (minutes):  36   Critical care was necessary to treat or prevent imminent or life-threatening deterioration of the following conditions:  Circulatory failure   Critical care was time spent personally by me on the following activities:  Discussions with consultants, evaluation of patient's response to treatment, examination of patient, ordering and performing treatments and interventions, ordering and review of laboratory studies, ordering and review of radiographic studies, pulse oximetry, re-evaluation of patient's condition, obtaining history from patient or surrogate and review of old charts   Medications Ordered in ED Medications  0.9 %  sodium chloride infusion (10 mL/hr Intravenous New Bag/Given 12/26/20 1723)    ED Course  I have reviewed the triage vital signs and the nursing notes.  Pertinent labs & imaging results that were available during my care of the patient were reviewed by me and considered in my medical decision making (see chart for details).    MDM Rules/Calculators/A&P                          Pt comes in with cc of weakness, chest pain. Has history of myelodysplastic syndrome and anemia, that requires blood transfusion periodically.  She is symptomatic with her anemia of hemoglobin of less than 7.  We will transfuse her 2 units of PRBC and discharge.  Final Clinical Impression(s) / ED Diagnoses Final diagnoses:  Symptomatic anemia    Rx / DC Orders ED Discharge Orders     None        Varney Biles, MD 12/26/20 1751

## 2020-12-26 NOTE — Telephone Encounter (Signed)
Patricia Mata states she is feeling like blood is low. Wants to come today for labs and possible blood tomorrow if needed. Will come at 2:00 for lab and will wait for results.

## 2020-12-26 NOTE — Telephone Encounter (Signed)
Unable to transfuse today at the [facility]. Pt states she is scared to go home without blood. She is driving herself. Was able to take to the ED room 3.

## 2020-12-26 NOTE — ED Notes (Signed)
Attempted to obtain IV access. Attempt unsuccessful. IV team orders placed.

## 2020-12-26 NOTE — ED Notes (Signed)
Pt requesting dose of Lasix between each unit of blood. Dr. Kathrynn Humble made aware.

## 2020-12-26 NOTE — Discharge Instructions (Addendum)
Please take your evening medications (insulin, blood pressure medications, others) when you return home tonight.  Tomorrow you can resume your normal medications.

## 2020-12-27 LAB — BPAM RBC
Blood Product Expiration Date: 202207032359
Blood Product Expiration Date: 202207032359
ISSUE DATE / TIME: 202206141707
ISSUE DATE / TIME: 202206142138
Unit Type and Rh: 600
Unit Type and Rh: 600

## 2020-12-27 LAB — TYPE AND SCREEN
ABO/RH(D): A NEG
Antibody Screen: NEGATIVE
Unit division: 0
Unit division: 0

## 2020-12-27 NOTE — Progress Notes (Addendum)
POST OPERATIVE OFFICE NOTE    CC:  F/u for surgery  HPI:  [name] who is s/p revision of right arm AVF with branch ligation and superficialization on 12/13/2020 by Dr. Donzetta Mata.  Original creation was on 09/26/2020 also by Dr. Donzetta Mata.  It had matured nicely but was too deep.   Pt has TDC that was placed 10/11/2020 by IR.  Pt states she does not have pain/numbness in the right hand.    She is nervous about using her fistula for the first time.   The pt is on dialysis T/T/S at [facility] location.   Allergies  Allergen Reactions   Ancef [Cefazolin] Itching    Severe itching- after procedure, ancef was the antibiotic.-04/02/17 Tolerates penicillins   Lactose Intolerance (Gi) Diarrhea   Morphine And Related Itching   Sulfa Antibiotics Rash and Other (See Comments)    Blisters, also    Current Outpatient Medications  Medication Sig Dispense Refill   allopurinol (ZYLOPRIM) 300 MG tablet Take 300 mg by mouth daily.     amiodarone (PACERONE) 200 MG tablet Take 1 tablet (200 mg total) by mouth daily. 90 tablet 3   atorvastatin (LIPITOR) 40 MG tablet Take 1 tablet by mouth once daily 90 tablet 1   blood glucose meter kit and supplies Dispense based on patient and insurance preference. Use up to four times daily as directed. (FOR ICD-10 E10.9, E11.9). (Patient taking differently: 1 each by Other route See admin instructions. Dispense based on patient and insurance preference. Use up to four times daily as directed. (FOR ICD-10 E10.9, E11.9).) 1 each 3   Blood Glucose Monitoring Suppl (ONETOUCH ULTRALINK) w/Device KIT Check blood sugars twice daily E11.9 (Patient taking differently: 1 each by Other route 2 (two) times daily. Check blood sugars twice daily E11.9) 1 kit 0   carvedilol (COREG) 3.125 MG tablet Take 1 tablet (3.125 mg total) by mouth 2 (two) times daily. 60 tablet 3   cloNIDine (CATAPRES) 0.1 MG tablet Take 1 tablet (0.1 mg total) by mouth 2 (two) times daily. 60 tablet 11    colchicine 0.6 MG tablet Take 0.6 mg by mouth daily as needed (gout).     fluticasone (FLONASE) 50 MCG/ACT nasal spray Place 1 spray into both nostrils daily. (Patient taking differently: Place 1 spray into both nostrils daily as needed for allergies.) 16 g 2   glucose blood test strip check blood sugars twice daily Dx code E11.22 (Patient taking differently: 1 each by Other route See admin instructions. check blood sugars twice daily Dx code E11.22) 100 each 3   hydrALAZINE (APRESOLINE) 100 MG tablet Take 1 tablet (100 mg total) by mouth 3 (three) times daily. 90 tablet 11   Insulin Glargine (BASAGLAR KWIKPEN) 100 UNIT/ML Inject 15 Units into the skin daily. Only if above 150     Insulin Pen Needle 29G X 5MM MISC Use as directed (Patient taking differently: 1 each by Other route as directed.) 200 each 0   Lancets (ONETOUCH DELICA PLUS [id number]) MISC check blood sugars twice daily Dx code: E11.22 (Patient taking differently: 1 each by Other route See admin instructions. check blood sugars twice daily Dx code: E11.22) 100 each 3   latanoprost (XALATAN) 0.005 % ophthalmic solution Place 1 drop into both eyes at bedtime.      levothyroxine (SYNTHROID) 150 MCG tablet Take 1 tablet (150 mcg total) by mouth daily before breakfast. 30 tablet 2   linagliptin (TRADJENTA) 5 MG TABS tablet  Take 1 tablet (5 mg total) by mouth daily. 90 tablet 1   Multiple Vitamins-Minerals (CENTRUM SILVER 50+WOMEN) TABS Take 1 tablet by mouth daily.     omeprazole (PRILOSEC) 40 MG capsule TAKE 1 CAPSULE BY MOUTH TWICE DAILY BEFORE A MEAL 180 capsule 0   oxyCODONE-acetaminophen (PERCOCET/ROXICET) 5-325 MG tablet Take 1 tablet by mouth every 6 (six) hours as needed. (Patient not taking: Reported on 12/26/2020) 20 tablet 0   polyethylene glycol (MIRALAX / GLYCOLAX) 17 g packet Take 17 g by mouth daily. (Patient taking differently: Take 17 g by mouth daily as needed for moderate constipation.) 14 each 0   sildenafil (REVATIO) 20 MG  tablet Take 1 tablet (20 mg total) by mouth 3 (three) times daily. 90 tablet 3   sodium bicarbonate 650 MG tablet Take 1,300 mg by mouth 2 (two) times daily.     No current facility-administered medications for this visit.     ROS:  See HPI  Physical Exam:  Today's Vitals   12/29/20 0902  BP: (!) 170/55  Pulse: 86  Temp: 98.2 F (36.8 C)  TempSrc: Skin  SpO2: 100%  Weight: 196 lb (88.9 kg)  Height: '5\' 4"'  (1.626 m)   Body mass index is 33.64 kg/m.   Incision:  healing nicely Extremities:   There is a faintly palpable right radial pulse.   Motor and sensory are in tact.   There is a thrill/bruit present.  The fistula is easily palpable    Assessment/Plan:  This is a 65 y.o. female who is s/p: evision of right arm AVF with branch ligation and superficialization on 12/13/2020 by Dr. Donzetta Mata.  Original creation was on 09/26/2020 also by Dr. Donzetta Mata.  It had matured nicely but was too deep.   Pt has TDC that was placed 10/11/2020 by IR.  -the pt does not have evidence of steal. -the fistula/graft can be used 01/23/2021.  Would like to give the distal incision a little more time to heal before accessing fistula.   -when the access has been used successfully to the satisfaction of the dialysis center, the tunneled catheter can be scheduled to be removed at their discretion.   -discussed with pt that access does not last forever and will need intervention or even new access at some point.  -she is nervous about sticking the fistula-discussed that there are numbing creams that can be used but this is up to the discretion of the dialysis center.  -the pt will follow up as needed.   Leontine Locket, [facility] [phone]  Clinic MD:  Patricia Mata

## 2020-12-28 ENCOUNTER — Telehealth: Payer: Self-pay

## 2020-12-28 DIAGNOSIS — I350 Nonrheumatic aortic (valve) stenosis: Secondary | ICD-10-CM

## 2020-12-28 NOTE — Telephone Encounter (Signed)
Called to arrange 6 month echo and visit with Dr. Burt Knack in November 2022.  Unable to leave message as VM has not been set up.  Will try again later.

## 2020-12-28 NOTE — Telephone Encounter (Signed)
Scheduled the patient for echo and office visit with Dr. Burt Knack 06/11/21. She was grateful for call and agrees with plan.

## 2020-12-29 ENCOUNTER — Ambulatory Visit (INDEPENDENT_AMBULATORY_CARE_PROVIDER_SITE_OTHER): Payer: Medicare Other | Admitting: Physician Assistant

## 2020-12-29 ENCOUNTER — Other Ambulatory Visit: Payer: Self-pay

## 2020-12-29 VITALS — BP 170/55 | HR 86 | Temp 98.2°F | Ht 64.0 in | Wt 196.0 lb

## 2020-12-29 DIAGNOSIS — N186 End stage renal disease: Secondary | ICD-10-CM

## 2020-12-29 DIAGNOSIS — Z992 Dependence on renal dialysis: Secondary | ICD-10-CM

## 2021-01-01 ENCOUNTER — Other Ambulatory Visit: Payer: Self-pay

## 2021-01-01 ENCOUNTER — Ambulatory Visit
Admission: RE | Admit: 2021-01-01 | Discharge: 2021-01-01 | Disposition: A | Discharge status: Home / Self Care | Payer: Medicare Other | Source: Ambulatory Visit | Attending: Nurse Practitioner | Admitting: Nurse Practitioner

## 2021-01-01 DIAGNOSIS — N2889 Other specified disorders of kidney and ureter: Secondary | ICD-10-CM

## 2021-01-02 ENCOUNTER — Telehealth: Payer: Self-pay

## 2021-01-02 NOTE — Telephone Encounter (Signed)
Pt called regarding appts for 6/23, stating she has dialysis on Thursdays and she will need to r/s. Per Dr Alen Blew and Lexine Baton, charge RN, it is OK for pt to come in 6/22 for labs and infusion appt in case she needs PRBC. Pt is aware and verbalized thanks and understanding. High priority message sent to scheduling.

## 2021-01-03 ENCOUNTER — Telehealth: Payer: Self-pay | Admitting: *Deleted

## 2021-01-03 ENCOUNTER — Other Ambulatory Visit: Payer: Self-pay | Admitting: Nurse Practitioner

## 2021-01-03 ENCOUNTER — Other Ambulatory Visit: Payer: Self-pay

## 2021-01-03 ENCOUNTER — Inpatient Hospital Stay: Payer: Medicare Other

## 2021-01-03 ENCOUNTER — Other Ambulatory Visit: Payer: Self-pay | Admitting: *Deleted

## 2021-01-03 DIAGNOSIS — D469 Myelodysplastic syndrome, unspecified: Secondary | ICD-10-CM

## 2021-01-03 DIAGNOSIS — D649 Anemia, unspecified: Secondary | ICD-10-CM

## 2021-01-03 DIAGNOSIS — E039 Hypothyroidism, unspecified: Secondary | ICD-10-CM

## 2021-01-03 LAB — CBC WITH DIFFERENTIAL (CANCER CENTER ONLY)
Abs Immature Granulocytes: 0.02 10*3/uL (ref 0.00–0.07)
Basophils Absolute: 0 10*3/uL (ref 0.0–0.1)
Basophils Relative: 0 %
Eosinophils Absolute: 0 10*3/uL (ref 0.0–0.5)
Eosinophils Relative: 1 %
HCT: 21.6 % — ABNORMAL LOW (ref 36.0–46.0)
Hemoglobin: 7.6 g/dL — ABNORMAL LOW (ref 12.0–15.0)
Immature Granulocytes: 1 %
Lymphocytes Relative: 10 %
Lymphs Abs: 0.4 10*3/uL — ABNORMAL LOW (ref 0.7–4.0)
MCH: 31 pg (ref 26.0–34.0)
MCHC: 35.2 g/dL (ref 30.0–36.0)
MCV: 88.2 fL (ref 80.0–100.0)
Monocytes Absolute: 0.8 10*3/uL (ref 0.1–1.0)
Monocytes Relative: 23 %
Neutro Abs: 2.2 10*3/uL (ref 1.7–7.7)
Neutrophils Relative %: 65 %
Platelet Count: 213 10*3/uL (ref 150–400)
RBC: 2.45 MIL/uL — ABNORMAL LOW (ref 3.87–5.11)
RDW: 17.1 % — ABNORMAL HIGH (ref 11.5–15.5)
WBC Count: 3.4 10*3/uL — ABNORMAL LOW (ref 4.0–10.5)
nRBC: 1.2 % — ABNORMAL HIGH (ref 0.0–0.2)

## 2021-01-03 LAB — PREPARE RBC (CROSSMATCH)

## 2021-01-03 LAB — SAMPLE TO BLOOD BANK

## 2021-01-03 MED ORDER — FUROSEMIDE 10 MG/ML IJ SOLN
20.0000 mg | Freq: Once | INTRAMUSCULAR | Status: AC
  Administered 2021-01-03: 20 mg via INTRAVENOUS

## 2021-01-03 MED ORDER — ACETAMINOPHEN 325 MG PO TABS
ORAL_TABLET | ORAL | Status: AC
  Filled 2021-01-03: qty 2

## 2021-01-03 MED ORDER — DIPHENHYDRAMINE HCL 25 MG PO CAPS
ORAL_CAPSULE | ORAL | Status: AC
  Filled 2021-01-03: qty 1

## 2021-01-03 MED ORDER — FUROSEMIDE 10 MG/ML IJ SOLN
INTRAMUSCULAR | Status: AC
  Filled 2021-01-03: qty 2

## 2021-01-03 MED ORDER — SODIUM CHLORIDE 0.9% IV SOLUTION
250.0000 mL | Freq: Once | INTRAVENOUS | Status: AC
Start: 2021-01-03 — End: 2021-01-03
  Administered 2021-01-03: 250 mL via INTRAVENOUS
  Filled 2021-01-03: qty 250

## 2021-01-03 MED ORDER — DIPHENHYDRAMINE HCL 25 MG PO CAPS
25.0000 mg | ORAL_CAPSULE | Freq: Once | ORAL | Status: AC
  Administered 2021-01-03: 25 mg via ORAL

## 2021-01-03 MED ORDER — ACETAMINOPHEN 325 MG PO TABS
650.0000 mg | ORAL_TABLET | Freq: Once | ORAL | Status: AC
  Administered 2021-01-03: 650 mg via ORAL

## 2021-01-03 NOTE — Progress Notes (Signed)
Verbal order per Dr. Alen Blew for patient to receive 2 units PRBCs today. Orders entered with premed: Tylenol 650 mg PO; Benadryl 25 mg PO; Lasix 20 mg IV between units. Orders confirmed with Sherlynn Stalls [facility].

## 2021-01-03 NOTE — Patient Instructions (Signed)
Blood Transfusion, Adult, Care After This sheet gives you information about how to care for yourself after your procedure. Your doctor may also give you more specific instructions. If youhave problems or questions, contact your doctor. What can I expect after the procedure? After the procedure, it is common to have: Bruising and soreness at the IV site. A fever or chills on the day of the procedure. This may be your body's response to the new blood cells received. A headache. Follow these instructions at home: Insertion site care     Follow instructions from your doctor about how to take care of your insertion site. This is where an IV tube was put into your vein. Make sure you: Wash your hands with soap and water before and after you change your bandage (dressing). If you cannot use soap and water, use hand sanitizer. Change your bandage as told by your doctor. Check your insertion site every day for signs of infection. Check for: Redness, swelling, or pain. Bleeding from the site. Warmth. Pus or a bad smell. General instructions Take over-the-counter and prescription medicines only as told by your doctor. Rest as told by your doctor. Go back to your normal activities as told by your doctor. Keep all follow-up visits as told by your doctor. This is important. Contact a doctor if: You have itching or red, swollen areas of skin (hives). You feel worried or nervous (anxious). You feel weak after doing your normal activities. You have redness, swelling, warmth, or pain around the insertion site. You have blood coming from the insertion site, and the blood does not stop with pressure. You have pus or a bad smell coming from the insertion site. Get help right away if: You have signs of a serious reaction. This may be coming from an allergy or the body's defense system (immune system). Signs include: Trouble breathing or shortness of breath. Swelling of the face or feeling warm  (flushed). Fever or chills. Head, chest, or back pain. Dark pee (urine) or blood in the pee. Widespread rash. Fast heartbeat. Feeling dizzy or light-headed. You may receive your blood transfusion in an outpatient setting. If so, youwill be told whom to contact to report any reactions. These symptoms may be an emergency. Do not wait to see if the symptoms will go away. Get medical help right away. Call your local emergency services (911 in the [place] Do not drive yourself to the hospital. Summary Bruising and soreness at the IV site are common. Check your insertion site every day for signs of infection. Rest as told by your doctor. Go back to your normal activities as told by your doctor. Get help right away if you have signs of a serious reaction. This information is not intended to replace advice given to you by your health care provider. Make sure you discuss any questions you have with your healthcare provider. Document Revised: 12/24/2018 Document Reviewed: 12/24/2018 Elsevier Patient Education  2022 [employer] Inc[employer]  

## 2021-01-03 NOTE — Telephone Encounter (Signed)
Opened in error

## 2021-01-04 ENCOUNTER — Other Ambulatory Visit: Payer: Self-pay

## 2021-01-04 ENCOUNTER — Ambulatory Visit: Payer: Medicare Other | Admitting: Oncology

## 2021-01-04 ENCOUNTER — Other Ambulatory Visit: Payer: Medicare Other

## 2021-01-04 ENCOUNTER — Telehealth: Payer: Self-pay

## 2021-01-04 LAB — BPAM RBC
Blood Product Expiration Date: 202207092359
Blood Product Expiration Date: 202207202359
ISSUE DATE / TIME: 202206221100
ISSUE DATE / TIME: 202206221100
Unit Type and Rh: 600
Unit Type and Rh: 600

## 2021-01-04 LAB — TYPE AND SCREEN
ABO/RH(D): A NEG
Antibody Screen: NEGATIVE
Unit division: 0
Unit division: 0

## 2021-01-04 NOTE — Chronic Care Management (AMB) (Signed)
Chronic Care Management Pharmacy Assistant   Name: Patricia Mata  MRN: [id number] DOB: Jul 07, 1956   Reason for Encounter: Disease State/ Diabetes   Recent office visits:  11-30-2020 Kellie Simmering, LPN. Annual wellness.  12-04-2020 [name], [place]. STOP Methoxy PEG-Epoetin Beta (patient not taking). STOP Ofloxacin 0.3% (patient not taking). STOP Sodium Chloride nasal spray (patient not taking). STOP Trazodone 25 mg (patient not taking). T3 free= 1.2.   Recent consult visits:  11-03-2020 [name], MD (Oncology). START Trazodone 25 mg at night. Blood infusion.  11-08-2020 Rozetta Nunnery, MD (Otolaryngology).  11-13-2020 Hyman Hopes, MD ([facility])  11-20-2020 Sherren Mocha, MD (Cardiology). CHANGE Basaglar 20 units TO 100 units.  11-24-2020 Karoline Caldwell, PA-C (Vascular and Vein)  11-27-2020 Conrad Pickens, NP ([facility]). STOP furosemide 40 mg. START Carvedilol 3.125 mg twice daily.  12-13-2020 Waynetta Sandy, MD ([facility]). RIGHT UPPER EXTREMITY ARTERIOVENOUS FISTULA TRANSPOSITION and LIGATION OF COMPETING BRANCHES OF RIGHT UPPER EXTREMITY ARTERIOVENOUS FISTULA procedures. START Oxycodone-acetaminophen 5-325 mg every 6 hors as needed   12-15-2020 Priscille Loveless, RN [facility] health oncology) Blood transfusion.  12-26-2020 Wyvonnia Dusky, MD [facility]). Blood transfusion.  12-29-2020 Gabriel Earing, PA-C (Vascular surgery).  01-03-2021 Demetrius Revel, RN [facility] oncology). Blood Tranfusion.  Hospital visits:  Medication Reconciliation was completed by comparing discharge summary, patient's EMR and Pharmacy list, and upon discussion with patient.  Admitted to the hospital on 10-27-2020 due to symptomatic anemia.  Discharge date was 10-27-2020. Discharged from [place]?Medications Started at [facility] Discharge:?? None  Medication  Changes at Hospital Discharge: None  Medications Discontinued at Hospital Discharge: None  Medications that remain the same after Hospital Discharge:??  -All other medications will remain the same.    Hospital visits:  Medication Reconciliation was completed by comparing discharge summary, patient's EMR and Pharmacy list, and upon discussion with patient.  Admitted to the hospital on 11-21-2020 due to Hypoxia. Discharge date was 11-23-2020. Discharged from [place]?Medications Started at [facility] Discharge:?? None  Medication Changes at Hospital Discharge: None  Medications Discontinued at Hospital Discharge: None  Medications that remain the same after Hospital Discharge:??  -All other medications will remain the same.    Medications: Outpatient Encounter Medications as of 01/04/2021  Medication Sig   allopurinol (ZYLOPRIM) 300 MG tablet Take 300 mg by mouth daily.   amiodarone (PACERONE) 200 MG tablet Take 1 tablet (200 mg total) by mouth daily.   atorvastatin (LIPITOR) 40 MG tablet Take 1 tablet by mouth once daily   blood glucose meter kit and supplies Dispense based on patient and insurance preference. Use up to four times daily as directed. (FOR ICD-10 E10.9, E11.9). (Patient taking differently: 1 each by Other route See admin instructions. Dispense based on patient and insurance preference. Use up to four times daily as directed. (FOR ICD-10 E10.9, E11.9).)   Blood Glucose Monitoring Suppl (ONETOUCH ULTRALINK) w/Device KIT Check blood sugars twice daily E11.9 (Patient taking differently: 1 each by Other route 2 (two) times daily. Check blood sugars twice daily E11.9)   carvedilol (COREG) 3.125 MG tablet Take 1 tablet (3.125 mg total) by mouth 2 (two) times daily.   cloNIDine (CATAPRES) 0.1 MG tablet Take 1 tablet (0.1 mg total) by mouth 2 (two) times daily.   colchicine 0.6 MG tablet Take 0.6 mg by mouth daily as needed (gout).  EUTHYROX 150 MCG tablet  TAKE 1 TABLET BY MOUTH ONCE DAILY BEFORE BREAKFAST   fluticasone (FLONASE) 50 MCG/ACT nasal spray Place 1 spray into both nostrils daily. (Patient taking differently: Place 1 spray into both nostrils daily as needed for allergies.)   glucose blood test strip check blood sugars twice daily Dx code E11.22 (Patient taking differently: 1 each by Other route See admin instructions. check blood sugars twice daily Dx code E11.22)   hydrALAZINE (APRESOLINE) 100 MG tablet Take 1 tablet (100 mg total) by mouth 3 (three) times daily.   Insulin Glargine (BASAGLAR KWIKPEN) 100 UNIT/ML Inject 15 Units into the skin daily. Only if above 150   Insulin Pen Needle 29G X 5MM MISC Use as directed (Patient taking differently: 1 each by Other route as directed.)   Lancets (ONETOUCH DELICA PLUS [id number]) MISC check blood sugars twice daily Dx code: E11.22 (Patient taking differently: 1 each by Other route See admin instructions. check blood sugars twice daily Dx code: E11.22)   latanoprost (XALATAN) 0.005 % ophthalmic solution Place 1 drop into both eyes at bedtime.    linagliptin (TRADJENTA) 5 MG TABS tablet Take 1 tablet (5 mg total) by mouth daily.   Multiple Vitamins-Minerals (CENTRUM SILVER 50+WOMEN) TABS Take 1 tablet by mouth daily.   omeprazole (PRILOSEC) 40 MG capsule TAKE 1 CAPSULE BY MOUTH TWICE DAILY BEFORE A MEAL   oxyCODONE-acetaminophen (PERCOCET/ROXICET) 5-325 MG tablet Take 1 tablet by mouth every 6 (six) hours as needed.   polyethylene glycol (MIRALAX / GLYCOLAX) 17 g packet Take 17 g by mouth daily. (Patient taking differently: Take 17 g by mouth daily as needed for moderate constipation.)   sildenafil (REVATIO) 20 MG tablet Take 1 tablet (20 mg total) by mouth 3 (three) times daily.   sodium bicarbonate 650 MG tablet Take 1,300 mg by mouth 2 (two) times daily.   No facility-administered encounter medications on file as of 01/04/2021.   Recent Relevant Labs: Lab Results  Component Value Date/Time    HGBA1C 5.2 10/25/2020 01:01 PM   HGBA1C 6.0 (H) 08/10/2020 04:01 PM   MICROALBUR 80 06/22/2019 12:02 PM    Kidney Function Lab Results  Component Value Date/Time   CREATININE 3.40 (H) 12/13/2020 09:27 AM   CREATININE 3.18 (H) 11/22/2020 12:19 PM   CREATININE 4.79 (HH) 09/28/2020 01:51 PM   CREATININE 3.31 ([place]) 09/21/2020 09:49 AM   CREATININE 2.24 (H) 01/27/2019 01:55 PM   CREATININE 2.48 (H) 09/08/2018 01:15 PM   CREATININE 1.4 (H) 05/26/2017 09:49 AM   CREATININE 1.7 (H) 05/09/2017 09:41 AM   GFRNONAA 16 (L) 11/22/2020 12:19 PM   GFRNONAA 10 (L) 09/28/2020 01:51 PM   GFRNONAA 22 (L) 01/27/2019 01:55 PM   GFRAA 16 (L) 09/05/2020 12:53 PM   GFRAA 20 (L) 04/07/2020 11:14 AM   GFRAA 26 (L) 01/27/2019 01:55 PM    Current antihyperglycemic regimen:  Basaglar 15 units daily Tradjenta 5 mg daily  What recent interventions/DTPs have been made to improve glycemic control:  Patient states she is taking medications as directed Have there been any recent hospitalizations or ED visits since last visit with CPP? Yes  Patient denies hypoglycemic symptoms  Patient denies hyperglycemic symptoms  How often are you checking your blood sugar? once daily  What are your blood sugars ranging?  Fasting: 180,121, 137 Before meals: None After meals: None Bedtime: None  During the week, how often does your blood glucose drop below 70? Patient states her blood sugar hasn't dropped below 70 in  a while.  Are you checking your feet daily/regularly? Patient states daily  Adherence Review: Is the patient currently on a STATIN medication? Yes Is the patient currently on ACE/ARB medication? No Does the patient have >5 day gap between last estimated fill dates? No  NOTES: Patient states she is doing fine but needs a refill on her thyroid medication. Patient states she spoke with [name] a few weeks ago about her thyroid lab work being low but didn't say if she was wanting to increase  medication. Sent message to Fransisco Beau CMA to follow up with patient. Sent scheduling a message to schedule with Orlando Penner CPP on 02-15-2021 at 2:00.    Star Rating Drugs: Tradjenta 5 mg- Last filled 12-12-2020 90 DS [facility] Atorvastatin 40 mg- Last filled 11-13-2020 90 DS [facility] Clinical Pharmacist Assistant [phone]

## 2021-01-05 ENCOUNTER — Other Ambulatory Visit: Payer: Self-pay

## 2021-01-05 DIAGNOSIS — E039 Hypothyroidism, unspecified: Secondary | ICD-10-CM

## 2021-01-07 DIAGNOSIS — N2581 Secondary hyperparathyroidism of renal origin: Secondary | ICD-10-CM | POA: Insufficient documentation

## 2021-01-13 ENCOUNTER — Other Ambulatory Visit: Payer: Self-pay

## 2021-01-13 ENCOUNTER — Other Ambulatory Visit (HOSPITAL_COMMUNITY): Payer: Self-pay

## 2021-01-13 ENCOUNTER — Observation Stay (HOSPITAL_COMMUNITY)
Admission: EM | Admit: 2021-01-13 | Discharge: 2021-01-14 | Disposition: A | Discharge status: Home / Self Care | Payer: Medicare Other | Attending: Internal Medicine | Admitting: Internal Medicine

## 2021-01-13 ENCOUNTER — Emergency Department (HOSPITAL_COMMUNITY): Payer: Medicare Other

## 2021-01-13 ENCOUNTER — Encounter (HOSPITAL_COMMUNITY): Payer: Self-pay | Admitting: *Deleted

## 2021-01-13 DIAGNOSIS — E039 Hypothyroidism, unspecified: Secondary | ICD-10-CM | POA: Insufficient documentation

## 2021-01-13 DIAGNOSIS — N186 End stage renal disease: Secondary | ICD-10-CM | POA: Diagnosis not present

## 2021-01-13 DIAGNOSIS — Z87891 Personal history of nicotine dependence: Secondary | ICD-10-CM | POA: Diagnosis not present

## 2021-01-13 DIAGNOSIS — I5033 Acute on chronic diastolic (congestive) heart failure: Secondary | ICD-10-CM | POA: Insufficient documentation

## 2021-01-13 DIAGNOSIS — D631 Anemia in chronic kidney disease: Secondary | ICD-10-CM | POA: Diagnosis not present

## 2021-01-13 DIAGNOSIS — I132 Hypertensive heart and chronic kidney disease with heart failure and with stage 5 chronic kidney disease, or end stage renal disease: Secondary | ICD-10-CM | POA: Insufficient documentation

## 2021-01-13 DIAGNOSIS — Z20822 Contact with and (suspected) exposure to covid-19: Secondary | ICD-10-CM | POA: Insufficient documentation

## 2021-01-13 DIAGNOSIS — Z992 Dependence on renal dialysis: Secondary | ICD-10-CM | POA: Diagnosis not present

## 2021-01-13 DIAGNOSIS — Z85038 Personal history of other malignant neoplasm of large intestine: Secondary | ICD-10-CM | POA: Insufficient documentation

## 2021-01-13 DIAGNOSIS — D649 Anemia, unspecified: Secondary | ICD-10-CM

## 2021-01-13 DIAGNOSIS — Z794 Long term (current) use of insulin: Secondary | ICD-10-CM | POA: Diagnosis not present

## 2021-01-13 DIAGNOSIS — E1122 Type 2 diabetes mellitus with diabetic chronic kidney disease: Secondary | ICD-10-CM | POA: Diagnosis not present

## 2021-01-13 DIAGNOSIS — R0789 Other chest pain: Secondary | ICD-10-CM | POA: Diagnosis present

## 2021-01-13 DIAGNOSIS — Z79899 Other long term (current) drug therapy: Secondary | ICD-10-CM | POA: Diagnosis not present

## 2021-01-13 DIAGNOSIS — N189 Chronic kidney disease, unspecified: Secondary | ICD-10-CM | POA: Diagnosis present

## 2021-01-13 LAB — BASIC METABOLIC PANEL
Anion gap: 16 — ABNORMAL HIGH (ref 5–15)
BUN: 62 mg/dL — ABNORMAL HIGH (ref 8–23)
CO2: 23 mmol/L (ref 22–32)
Calcium: 9.5 mg/dL (ref 8.9–10.3)
Chloride: 93 mmol/L — ABNORMAL LOW (ref 98–111)
Creatinine, Ser: 3.82 mg/dL — ABNORMAL HIGH (ref 0.44–1.00)
GFR, Estimated: 12 mL/min — ABNORMAL LOW (ref >60)
Glucose, Bld: 158 mg/dL — ABNORMAL HIGH (ref 70–99)
Potassium: 3.9 mmol/L (ref 3.5–5.1)
Sodium: 132 mmol/L — ABNORMAL LOW (ref 135–145)

## 2021-01-13 LAB — TROPONIN I (HIGH SENSITIVITY)
Troponin I (High Sensitivity): 40 ng/L — ABNORMAL HIGH (ref <18)
Troponin I (High Sensitivity): 45 ng/L — ABNORMAL HIGH (ref <18)

## 2021-01-13 LAB — HEMOGLOBIN A1C
Hgb A1c MFr Bld: 6.1 % — ABNORMAL HIGH (ref 4.8–5.6)
Mean Plasma Glucose: 128.37 mg/dL

## 2021-01-13 LAB — CBC
HCT: 19.2 % — ABNORMAL LOW (ref 36.0–46.0)
Hemoglobin: 6.5 g/dL — CL (ref 12.0–15.0)
MCH: 30.2 pg (ref 26.0–34.0)
MCHC: 33.9 g/dL (ref 30.0–36.0)
MCV: 89.3 fL (ref 80.0–100.0)
Platelets: 250 10*3/uL (ref 150–400)
RBC: 2.15 MIL/uL — ABNORMAL LOW (ref 3.87–5.11)
RDW: 18.2 % — ABNORMAL HIGH (ref 11.5–15.5)
WBC: 4.4 10*3/uL (ref 4.0–10.5)
nRBC: 0.5 % — ABNORMAL HIGH (ref 0.0–0.2)

## 2021-01-13 LAB — SARS CORONAVIRUS 2 (TAT 6-24 HRS): SARS Coronavirus 2: NEGATIVE

## 2021-01-13 LAB — PREPARE RBC (CROSSMATCH)

## 2021-01-13 MED ORDER — LINAGLIPTIN 5 MG PO TABS
5.0000 mg | ORAL_TABLET | Freq: Every day | ORAL | Status: DC
  Administered 2021-01-14: 5 mg via ORAL
  Filled 2021-01-13: qty 1

## 2021-01-13 MED ORDER — LEVOTHYROXINE SODIUM 75 MCG PO TABS
150.0000 ug | ORAL_TABLET | Freq: Every day | ORAL | Status: DC
  Administered 2021-01-14: 150 ug via ORAL
  Filled 2021-01-13: qty 2

## 2021-01-13 MED ORDER — COLCHICINE 0.6 MG PO TABS
0.6000 mg | ORAL_TABLET | Freq: Every day | ORAL | Status: DC | PRN
  Filled 2021-01-13: qty 1

## 2021-01-13 MED ORDER — CLONIDINE HCL 0.1 MG PO TABS
0.1000 mg | ORAL_TABLET | Freq: Two times a day (BID) | ORAL | Status: DC
  Administered 2021-01-13 – 2021-01-14 (×3): 0.1 mg via ORAL
  Filled 2021-01-13 (×3): qty 1

## 2021-01-13 MED ORDER — SODIUM CHLORIDE 0.9 % IV SOLN
10.0000 mL/h | Freq: Once | INTRAVENOUS | Status: AC
  Administered 2021-01-13: 10 mL/h via INTRAVENOUS

## 2021-01-13 MED ORDER — LATANOPROST 0.005 % OP SOLN
1.0000 [drp] | Freq: Every day | OPHTHALMIC | Status: DC
  Administered 2021-01-13: 1 [drp] via OPHTHALMIC
  Filled 2021-01-13: qty 2.5

## 2021-01-13 MED ORDER — CARVEDILOL 3.125 MG PO TABS
3.1250 mg | ORAL_TABLET | Freq: Two times a day (BID) | ORAL | Status: DC
  Administered 2021-01-13 – 2021-01-14 (×3): 3.125 mg via ORAL
  Filled 2021-01-13 (×3): qty 1

## 2021-01-13 MED ORDER — ALLOPURINOL 300 MG PO TABS
300.0000 mg | ORAL_TABLET | Freq: Every day | ORAL | Status: DC
  Administered 2021-01-14: 300 mg via ORAL
  Filled 2021-01-13: qty 1

## 2021-01-13 MED ORDER — INSULIN ASPART 100 UNIT/ML IJ SOLN
0.0000 [IU] | Freq: Three times a day (TID) | INTRAMUSCULAR | Status: DC
Start: 2021-01-13 — End: 2021-01-14
  Administered 2021-01-14: 1 [IU] via SUBCUTANEOUS

## 2021-01-13 MED ORDER — SILDENAFIL CITRATE 20 MG PO TABS
20.0000 mg | ORAL_TABLET | Freq: Three times a day (TID) | ORAL | Status: DC
  Administered 2021-01-13 – 2021-01-14 (×2): 20 mg via ORAL
  Filled 2021-01-13 (×5): qty 1

## 2021-01-13 MED ORDER — CHLORHEXIDINE GLUCONATE CLOTH 2 % EX PADS: 6.0000 | MEDICATED_PAD | Freq: Every day | CUTANEOUS | Status: DC

## 2021-01-13 MED ORDER — ATORVASTATIN CALCIUM 40 MG PO TABS
40.0000 mg | ORAL_TABLET | Freq: Every day | ORAL | Status: DC
  Administered 2021-01-14: 40 mg via ORAL
  Filled 2021-01-13: qty 1

## 2021-01-13 MED ORDER — POLYETHYLENE GLYCOL 3350 17 G PO PACK
17.0000 g | PACK | Freq: Every day | ORAL | Status: DC
  Administered 2021-01-14: 17 g via ORAL
  Filled 2021-01-13: qty 1

## 2021-01-13 MED ORDER — HYDRALAZINE HCL 50 MG PO TABS
100.0000 mg | ORAL_TABLET | Freq: Three times a day (TID) | ORAL | Status: DC
  Administered 2021-01-13 – 2021-01-14 (×2): 100 mg via ORAL
  Filled 2021-01-13 (×4): qty 2

## 2021-01-13 MED ORDER — INSULIN GLARGINE 100 UNIT/ML ~~LOC~~ SOLN
15.0000 [IU] | Freq: Every day | SUBCUTANEOUS | Status: DC
  Administered 2021-01-14: 15 [IU] via SUBCUTANEOUS
  Filled 2021-01-13: qty 0.15

## 2021-01-13 MED ORDER — HEPARIN SODIUM (PORCINE) 1000 UNIT/ML IJ SOLN
INTRAMUSCULAR | Status: AC
  Filled 2021-01-13: qty 4

## 2021-01-13 MED ORDER — HEPARIN SODIUM (PORCINE) 5000 UNIT/ML IJ SOLN
5000.0000 [IU] | Freq: Three times a day (TID) | INTRAMUSCULAR | Status: DC
  Administered 2021-01-13 – 2021-01-14 (×2): 5000 [IU] via SUBCUTANEOUS
  Filled 2021-01-13 (×2): qty 1

## 2021-01-13 MED ORDER — AMIODARONE HCL 200 MG PO TABS
200.0000 mg | ORAL_TABLET | Freq: Every day | ORAL | Status: DC
  Administered 2021-01-14: 200 mg via ORAL
  Filled 2021-01-13: qty 1

## 2021-01-13 NOTE — ED Triage Notes (Signed)
The pt is c/o lt sided chest and arm pain since 0400am no sob no nausea no dizziness  the pt reports that she usually has this type pain when ever she needs a blood transfusion

## 2021-01-13 NOTE — ED Notes (Signed)
1st unit prbc initiated. Pt is tolerating well. No signs of allergic reaction at this time. Will continue to monitor.

## 2021-01-13 NOTE — H&P (Signed)
History and Physical    Patricia Mata MRN:[id number] DOB: 10/26/1955 DOA: 01/13/2021  PCP: Moore, Janece, FNP (Confirm with patient/family/NH records and if not entered, this has to be entered at [facility] point of entry) Patient coming from: Home  I have personally briefly reviewed patient's old medical records in [facility]  Chief Complaint: Feeling tired, SOB  HPI: Patricia Mata is a 65 y.o. 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  This time, patient started to experience fatigue for the last 3 days, denies any shortness of breath, no fever chills no cough.  This morning, patient woke up with new onset of sharp-like chest pain, retrosternal radiating to left shoulder and arm, but denied any associated symptoms such as shortness of breath palpitations or sweating.  ED Course: EKG no acute ST changes, troponin 40>45. Hb 6.5 compared to 7.6 last week  Review of Systems: As per HPI otherwise 14 point review of systems negative.    Past Medical History:  Diagnosis Date   Acute renal failure (ARF) (HCC) 06/08/2018   [facility] [address]   Carotid artery disease (HCC)    Chronic diastolic (congestive) heart failure (HCC)    Colon cancer (HCC) 2008   Diabetes mellitus (HCC)    Dyspnea    Gout 09/08/2018   Heart attack (HCC)    Heart murmur    Hypertension    Hypothyroidism    Macrocytic anemia    MDS (myelodysplastic syndrome) (HCC)    Moderate mitral stenosis    PAD (peripheral artery disease) (HCC)    PAF (paroxysmal atrial  fibrillation) (HCC)    Pulmonary hypertension (HCC)    Renal artery stenosis (HCC)    a. >60% R renal artery stenosis by duplex 2021, managed medically.   Severe aortic stenosis    Stage 4 chronic kidney disease (HCC)    Transfusion-dependent anemia     Past Surgical History:  Procedure Laterality Date   AV FISTULA PLACEMENT Right 09/26/2020   Procedure: RIGHT ARM ARTERIOVENOUS (AV) FISTULA CREATION;  Surgeon: Cain, Brandon Christopher, MD;  Location: [facility];  Service: Vascular;  Laterality: Right;  Monitor Anesthesia Care with Regional Block to Right Arm    BASCILIC VEIN TRANSPOSITION Right 12/13/2020   Procedure: RIGHT UPPER EXTREMITY ARTERIOVENOUS FISTULA TRANSPOSITION;  Surgeon: Cain, Brandon Christopher, MD;  Location: [facility];  Service: Vascular;  Laterality: Right;   COLON SURGERY     DEBRIDMENT OF DECUBITUS ULCER N/A 06/12/2018   Procedure: DEBRIDMENT OF DECUBITUS ULCER;  Surgeon: Dillingham, Claire S, DO;  Location: [facility];  Service: Plastics;  Laterality: N/A;   ESOPHAGOGASTRODUODENOSCOPY N/A 09/01/2020   Procedure: ESOPHAGOGASTRODUODENOSCOPY (EGD);  Surgeon: Magod, Marc, MD;  Location: [facility];  Service: Endoscopy;  Laterality: N/A;   GIVENS CAPSULE STUDY N/A 09/01/2020   Procedure: GIVENS CAPSULE STUDY;  Surgeon: Magod, Marc, MD;  Location: [facility];  Service: Endoscopy;  Laterality: N/A;   IR FLUORO GUIDE CV LINE RIGHT  10/11/2020   IR FLUORO GUIDE PORT INSERTION RIGHT  04/02/2017   IR REMOVAL TUN ACCESS W/ PORT W/O FL MOD SED  03/16/2019     IR US GUIDE VASC ACCESS RIGHT  04/02/2017   IR US GUIDE VASC ACCESS RIGHT  10/11/2020   LIGATION OF COMPETING BRANCHES OF ARTERIOVENOUS FISTULA Right 12/13/2020   Procedure: LIGATION OF COMPETING BRANCHES OF RIGHT UPPER EXTREMITY ARTERIOVENOUS FISTULA;  Surgeon: Waynetta Sandy, MD;  Location: [facility];  Service: Vascular;  Laterality: Right;   RIGHT HEART CATH N/A 04/23/2019   Procedure: RIGHT HEART CATH;  Surgeon: Jolaine Artist,  MD;  Location: [facility];  Service: Cardiovascular;  Laterality: N/A;   RIGHT/LEFT HEART CATH AND CORONARY ANGIOGRAPHY N/A 05/21/2018   Procedure: RIGHT/LEFT HEART CATH AND CORONARY ANGIOGRAPHY;  Surgeon: Nelva Bush, MD;  Location: [facility];  Service: Cardiovascular;  Laterality: N/A;   RIGHT/LEFT HEART CATH AND CORONARY ANGIOGRAPHY N/A 10/13/2020   Procedure: RIGHT/LEFT HEART CATH AND CORONARY ANGIOGRAPHY;  Surgeon: Jolaine Artist, MD;  Location: [facility];  Service: Cardiovascular;  Laterality: N/A;   TUBAL LIGATION       reports that she quit smoking about 25 years ago. Her smoking use included cigarettes. She has a 5.00 pack-year smoking history. She has quit using smokeless tobacco. She reports current alcohol use of about 1.0 standard drink of alcohol per week. She reports that she does not use drugs.  Allergies  Allergen Reactions   Ancef [Cefazolin] Itching    Severe itching- after procedure, ancef was the antibiotic.-04/02/17 Tolerates penicillins   Lactose Intolerance (Gi) Diarrhea   Morphine And Related Itching   Sulfa Antibiotics Rash and Other (See Comments)    Blisters, also    Family History  Problem Relation Age of Onset   Hypertension Mother    Diabetes Mother    Cervical cancer Mother    Heart attack Father    Hypertension Sister    Hypertension Brother    Hypertension Sister    Hypertension Sister    Prostate cancer Brother    HIV/AIDS Brother      Prior to Admission medications   Medication Sig Start Date End Date Taking? Authorizing Provider  allopurinol (ZYLOPRIM) 300 MG tablet Take 300 mg by mouth daily. 07/05/20  Yes [provider]  amiodarone (PACERONE) 200 MG tablet Take 1 tablet (200 mg total) by mouth daily. 07/19/20  Yes Bensimhon, Shaune Pascal, MD  atorvastatin (LIPITOR) 40 MG tablet Take 1 tablet by mouth once daily Patient taking differently: Take 40 mg by mouth daily. 11/13/20  Yes [name], FNP   carvedilol (COREG) 3.125 MG tablet Take 1 tablet (3.125 mg total) by mouth 2 (two) times daily. 11/27/20  Yes Clegg, Amy D, NP  cholecalciferol (VITAMIN D3) 25 MCG (1000 UNIT) tablet Take 1,000 Units by mouth daily.   Yes [provider]  cloNIDine (CATAPRES) 0.1 MG tablet Take 1 tablet (0.1 mg total) by mouth 2 (two) times daily. 07/03/20  Yes Bensimhon, Shaune Pascal, MD  colchicine 0.6 MG tablet Take 0.6 mg by mouth daily as needed (gout).   Yes [provider]  EUTHYROX 150 MCG tablet TAKE 1 TABLET BY MOUTH ONCE DAILY BEFORE BREAKFAST Patient taking differently: Take 150 mcg by mouth daily before breakfast. 01/04/21  Yes [name], FNP  hydrALAZINE (APRESOLINE) 100 MG tablet Take 1 tablet (100 mg total) by mouth 3 (three) times daily. 09/27/20  Yes Lorretta Harp, MD  Insulin Glargine [facility] KWIKPEN) 100 UNIT/ML Inject 15 Units into the skin daily. Only if above 150   Yes [provider]  latanoprost (XALATAN) 0.005 % ophthalmic  solution Place 1 drop into both eyes at bedtime.    Yes [provider]  linagliptin (TRADJENTA) 5 MG TABS tablet Take 1 tablet (5 mg total) by mouth daily. 12/12/20  Yes [name], FNP  Multiple Vitamins-Minerals (CENTRUM SILVER 50+WOMEN) TABS Take 1 tablet by mouth daily.   Yes [provider]  polyethylene glycol (MIRALAX / GLYCOLAX) 17 g packet Take 17 g by mouth daily. Patient taking differently: Take 17 g by mouth daily as needed for moderate constipation. 10/16/20  Yes Barb Merino, MD  sildenafil (REVATIO) 20 MG tablet Take 1 tablet (20 mg total) by mouth 3 (three) times daily. 09/06/20  Yes Bensimhon, Shaune Pascal, MD  blood glucose meter kit and supplies Dispense based on patient and insurance preference. Use up to four times daily as directed. (FOR ICD-10 E10.9, E11.9). Patient taking differently: 1 each by Other route See admin instructions. Dispense based on patient and insurance preference. Use up to four times  daily as directed. (FOR ICD-10 E10.9, E11.9). 11/04/18   [name], FNP  Blood Glucose Monitoring Suppl (ONETOUCH ULTRALINK) w/Device KIT Check blood sugars twice daily E11.9 Patient taking differently: 1 each by Other route 2 (two) times daily. Check blood sugars twice daily E11.9 11/30/19   [name], FNP  fluticasone (FLONASE) 50 MCG/ACT nasal spray Place 1 spray into both nostrils daily. Patient not taking: No sig reported 01/28/20   Collene Gobble, MD  glucose blood test strip check blood sugars twice daily Dx code E11.22 Patient taking differently: 1 each by Other route See admin instructions. check blood sugars twice daily Dx code E11.22 03/02/20   [name], FNP  Insulin Pen Needle 29G X 5MM MISC Use as directed Patient taking differently: 1 each by Other route as directed. 06/16/17   Bonnielee Haff, MD  Lancets (ONETOUCH DELICA PLUS [id number]) [place] check blood sugars twice daily Dx code: E11.22 Patient taking differently: 1 each by Other route See admin instructions. check blood sugars twice daily Dx code: E11.22 03/02/20   [name], FNP  omeprazole (PRILOSEC) 40 MG capsule TAKE 1 CAPSULE BY MOUTH TWICE DAILY BEFORE A MEAL Patient not taking: Reported on 01/13/2021 12/04/20   [name], FNP  oxyCODONE-acetaminophen (PERCOCET/ROXICET) 5-325 MG tablet Take 1 tablet by mouth every 6 (six) hours as needed. Patient not taking: Reported on 01/13/2021 12/13/20   Ulyses Amor, PA-C    Physical Exam: Vitals:   01/13/21 1034 01/13/21 1100 01/13/21 1130 01/13/21 1200  BP: (!) 132/34 (!) 161/40 (!) 155/60 (!) 162/38  Pulse: 78 73 73 73  Resp: _0 Temp:   98.1 F (36.7 C)   TempSrc:   Oral   SpO2: 96% 96% 96% 96%  Weight:      Height:        Constitutional: NAD, calm, comfortable Vitals:   01/13/21 1034 01/13/21 1100 01/13/21 1130 01/13/21 1200  BP: (!) 132/34 (!) 161/40 (!) 155/60 (!) 162/38  Pulse: 78 73 73 73  Resp: _1 Temp:   98.1 F (36.7 C)    TempSrc:   Oral   SpO2: 96% 96% 96% 96%  Weight:      Height:       Eyes: PERRL, lids and conjunctivae looks pale ENMT: Mucous membranes are moist. Posterior pharynx clear of any exudate or lesions.Normal dentition.  Neck: normal, supple, no masses, no thyromegaly Respiratory: clear to auscultation bilaterally, no wheezing, no crackles. Normal respiratory effort. No accessory muscle  use.  Cardiovascular: Regular rate and rhythm, diastolic murmur on apex. No extremity edema. 2+ pedal pulses. No carotid bruits.  Abdomen: no tenderness, no masses palpated. No hepatosplenomegaly. Bowel sounds positive.  Musculoskeletal: no clubbing / cyanosis. No joint deformity upper and lower extremities. Good ROM, no contractures. Normal muscle tone.  Skin: no rashes, lesions, ulcers. No induration Neurologic: CN 2-12 grossly intact. Sensation intact, DTR normal. Strength 5/5 in all 4.  Psychiatric: Normal judgment and insight. Alert and oriented x 3. Normal mood.    Labs on Admission: I have personally reviewed following labs and imaging studies  CBC: Recent Labs  Lab 01/13/21 0652  WBC 4.4  HGB 6.5*  HCT 19.2*  MCV 89.3  PLT 250   Basic Metabolic Panel: Recent Labs  Lab 01/13/21 0652  NA 132*  K 3.9  CL 93*  CO2 23  GLUCOSE 158*  BUN 62*  CREATININE 3.82*  CALCIUM 9.5   GFR: Estimated Creatinine Clearance: 15.7 mL/min (A) (by C-G formula based on SCr of 3.82 mg/dL (H)). Liver Function Tests: No results for input(s): AST, ALT, ALKPHOS, BILITOT, PROT, ALBUMIN in the last 168 hours. No results for input(s): LIPASE, AMYLASE in the last 168 hours. No results for input(s): AMMONIA in the last 168 hours. Coagulation Profile: No results for input(s): INR, PROTIME in the last 168 hours. Cardiac Enzymes: No results for input(s): CKTOTAL, CKMB, CKMBINDEX, TROPONINI in the last 168 hours. BNP (last 3 results) No results for input(s): PROBNP in the last 8760 hours. HbA1C: No results for  input(s): HGBA1C in the last 72 hours. CBG: No results for input(s): GLUCAP in the last 168 hours. Lipid Profile: No results for input(s): CHOL, HDL, LDLCALC, TRIG, CHOLHDL, LDLDIRECT in the last 72 hours. Thyroid Function Tests: No results for input(s): TSH, T4TOTAL, FREET4, T3FREE, THYROIDAB in the last 72 hours. Anemia Panel: No results for input(s): VITAMINB12, FOLATE, FERRITIN, TIBC, IRON, RETICCTPCT in the last 72 hours. Urine analysis:    Component Value Date/Time   COLORURINE AMBER (A) 10/09/2020 2320   APPEARANCEUR CLOUDY (A) 10/09/2020 2320   LABSPEC 1.016 10/09/2020 2320   PHURINE 5.0 10/09/2020 2320   GLUCOSEU NEGATIVE 10/09/2020 2320   HGBUR SMALL (A) 10/09/2020 2320   BILIRUBINUR NEGATIVE 10/09/2020 2320   BILIRUBINUR negative 05/11/2020 1152   KETONESUR NEGATIVE 10/09/2020 2320   PROTEINUR 100 (A) 10/09/2020 2320   UROBILINOGEN 0.2 05/11/2020 1152   NITRITE NEGATIVE 10/09/2020 2320   LEUKOCYTESUR MODERATE (A) 10/09/2020 2320    Radiological Exams on Admission: DG Chest 2 View  Result Date: 01/13/2021 CLINICAL DATA:  65-year-old female with left side chest pain since 0400 hours. EXAM: CHEST - 2 VIEW COMPARISON:  Portable chest 12/26/2020 and earlier. FINDINGS: PA and lateral views of the chest today. Stable right chest dual lumen dialysis catheter. Stable borderline cardiomegaly. Other mediastinal contours are within normal limits. Visualized tracheal air column is within normal limits. No pneumothorax or pleural effusion. No pulmonary edema or confluent pulmonary opacity. No acute osseous abnormality identified. Abdominal Calcified aortic atherosclerosis. Negative visible bowel gas pattern. IMPRESSION: No acute cardiopulmonary abnormality. Aortic Atherosclerosis (ICD10-I70.0). Electronically Signed   By: H  Hall M.D.   On: 01/13/2021 07:57    EKG: Independently reviewed. Sinus, no acute ST-T changes.  Assessment/Plan Active Problems:   Anemia due to chronic kidney  disease   Anemia  (please populate well all problems here in Problem List. (For example, if patient is on BP meds at home and you resume or decide to   hold them, it is a problem that needs to be her. Same for CAD, COPD, HLD and so on)  Symptomatic anemia acute on chronic -Secondary to MDS and ESRD -Nephrology on board, plan to transfuse PRBC x2 during dialysis today. -Repeat CBC tomorrow.  Chest pain -Secondary to symptomatic anemia, ACS rule out.  Patient had a cardiac cath April 2022 showing multivessel noncritical obstructions. -  ESRD on HD -HD today  HTN -Fairly controlled, continue home BP regimen, Coreg, hydralazine and clonidine  IDDM -Continue home regimen of insulin sliding scale.  Severe aortic stenosis/moderate mitral stenosis -As per cardiology evaluation after cardiac cath in 10/2020, Consider anatomy suitable for TAVR, recommend outpatient follow-up with structural heart team.  Severe pulmonary hypertension -Continue Sidenafil 3 times daily  PAF -On amiodarone, not a candidate for systemic anticoagulation due to severe transfusion dependent anemia.  DVT prophylaxis: Heparin subcu Code Status: Full Code Family Communication: None at bedside Disposition Plan: Expect more than 2 midnight hospital stay Consults called: Dr. Schertz Admission status: Tele obs  Ping T Zhang MD [facility] Pager [phone]  01/13/2021, 12:54 PM    

## 2021-01-13 NOTE — Consult Note (Signed)
Renal Service Consult Note [facility]  Derin Matthes 01/13/2021 Sol Blazing, MD Requesting Physician: Dr [name] Locks  Reason for Consult: ESRD pt w/ anemia HPI: The patient is a 65 y.o. year-old w/ hx of ESRD on HD, DM2, gout, HTN, MDS, anemia requiring frequent tranfusions, CAD w/ noncritical disease, PAD, PAF, pHTN presented to ED w/ CP and arm pain. ED w/u showed hb 6.5 (7.6 last week). She has hx of MDS and usually requires prbc's every couple of weeks reportedly. We are asked to see for ESRD.     Pt seen in ED, main c/o's are L sided chest and shoulder pain, now relieved. No SOB< cough, abd pain, no f/c/s. +black tarry stools last 3d ago.   ROS , no joint pain, no HA, no blurry vision, no rash, no diarrhea, no nausea/ vomiting, no dysuria, no difficulty voiding   Past Medical History  Past Medical History:  Diagnosis Date   Acute renal failure (ARF) ([place]) 06/08/2018   TTHS- [place]   Carotid artery disease ([place])    Chronic diastolic (congestive) heart failure ([place])    Colon cancer ([place]) 2008   Diabetes mellitus ([place])    Dyspnea    Gout 09/08/2018   Heart attack ([place])    Heart murmur    Hypertension    Hypothyroidism    Macrocytic anemia    MDS (myelodysplastic syndrome) (HCC)    Moderate mitral stenosis    PAD (peripheral artery disease) (HCC)    PAF (paroxysmal atrial fibrillation) (HCC)    Pulmonary hypertension (HCC)    Renal artery stenosis (HCC)    a. >60% R renal artery stenosis by duplex 2021, managed medically.   Severe aortic stenosis    Stage 4 chronic kidney disease (HCC)    Transfusion-dependent anemia    Past Surgical History  Past Surgical History:  Procedure Laterality Date   AV FISTULA PLACEMENT Right 09/26/2020   Procedure: RIGHT ARM ARTERIOVENOUS (AV) FISTULA CREATION;  Surgeon: Waynetta Sandy, MD;  Location: [place];  Service: Vascular;  Laterality: Right;  Monitor Anesthesia Care with Regional Block to Right Arm     [place] Right 12/13/2020   Procedure: RIGHT UPPER EXTREMITY ARTERIOVENOUS FISTULA TRANSPOSITION;  Surgeon: Waynetta Sandy, MD;  Location: [place];  Service: Vascular;  Laterality: Right;   COLON SURGERY     DEBRIDMENT OF DECUBITUS ULCER N/A 06/12/2018   Procedure: DEBRIDMENT OF DECUBITUS ULCER;  Surgeon: Wallace Going, DO;  Location: [facility];  Service: Plastics;  Laterality: N/A;   ESOPHAGOGASTRODUODENOSCOPY N/A 09/01/2020   Procedure: ESOPHAGOGASTRODUODENOSCOPY (EGD);  Surgeon: Clarene Essex, MD;  Location: [facility];  Service: Endoscopy;  Laterality: N/A;   GIVENS CAPSULE STUDY N/A 09/01/2020   Procedure: GIVENS CAPSULE STUDY;  Surgeon: Clarene Essex, MD;  Location: [facility];  Service: Endoscopy;  Laterality: N/A;   IR FLUORO GUIDE CV LINE RIGHT  10/11/2020   IR FLUORO GUIDE PORT INSERTION RIGHT  04/02/2017   IR REMOVAL TUN ACCESS W/ PORT W/O FL MOD SED  03/16/2019   IR US GUIDE VASC ACCESS RIGHT  04/02/2017   IR US GUIDE VASC ACCESS RIGHT  10/11/2020   LIGATION OF COMPETING BRANCHES OF ARTERIOVENOUS FISTULA Right 12/13/2020   Procedure: LIGATION OF COMPETING BRANCHES OF RIGHT UPPER EXTREMITY ARTERIOVENOUS FISTULA;  Surgeon: Waynetta Sandy, MD;  Location: [place];  Service: Vascular;  Laterality: Right;   RIGHT HEART CATH N/A 04/23/2019   Procedure: RIGHT HEART CATH;  Surgeon: Glori Bickers  R, MD;  Location: [place] [facility];  Service: Cardiovascular;  Laterality: N/A;   RIGHT/LEFT HEART CATH AND CORONARY ANGIOGRAPHY N/A 05/21/2018   Procedure: RIGHT/LEFT HEART CATH AND CORONARY ANGIOGRAPHY;  Surgeon: Nelva Bush, MD;  Location: [place] [facility];  Service: Cardiovascular;  Laterality: N/A;   RIGHT/LEFT HEART CATH AND CORONARY ANGIOGRAPHY N/A 10/13/2020   Procedure: RIGHT/LEFT HEART CATH AND CORONARY ANGIOGRAPHY;  Surgeon: Jolaine Artist, MD;  Location: [place] [facility];  Service: Cardiovascular;  Laterality: N/A;   TUBAL LIGATION      Family History  Family History  Problem Relation Age of Onset   Hypertension Mother    Diabetes Mother    Cervical cancer Mother    Heart attack Father    Hypertension Sister    Hypertension Brother    Hypertension Sister    Hypertension Sister    Prostate cancer Brother    HIV/AIDS Brother    Social History  reports that she quit smoking about 25 years ago. Her smoking use included cigarettes. She has a 5.00 pack-year smoking history. She has quit using smokeless tobacco. She reports current alcohol use of about 1.0 standard drink of alcohol per week. She reports that she does not use drugs. Allergies  Allergies  Allergen Reactions   Ancef [Cefazolin] Itching    Severe itching- after procedure, ancef was the antibiotic.-04/02/17 Tolerates penicillins   Lactose Intolerance (Gi) Diarrhea   Morphine And Related Itching   Sulfa Antibiotics Rash and Other (See Comments)    Blisters, also   Home medications Prior to Admission medications   Medication Sig Start Date End Date Taking? Authorizing Provider  allopurinol (ZYLOPRIM) 300 MG tablet Take 300 mg by mouth daily. 07/05/20  Yes [provider]  amiodarone (PACERONE) 200 MG tablet Take 1 tablet (200 mg total) by mouth daily. 07/19/20  Yes Bensimhon, Shaune Pascal, MD  atorvastatin (LIPITOR) 40 MG tablet Take 1 tablet by mouth once daily Patient taking differently: Take 40 mg by mouth daily. 11/13/20  Yes [name], FNP  carvedilol (COREG) 3.125 MG tablet Take 1 tablet (3.125 mg total) by mouth 2 (two) times daily. 11/27/20  Yes Clegg, Amy D, NP  cholecalciferol (VITAMIN D3) 25 MCG (1000 UNIT) tablet Take 1,000 Units by mouth daily.   Yes [provider]  cloNIDine (CATAPRES) 0.1 MG tablet Take 1 tablet (0.1 mg total) by mouth 2 (two) times daily. 07/03/20  Yes Bensimhon, Shaune Pascal, MD  colchicine 0.6 MG tablet Take 0.6 mg by mouth daily as needed (gout).   Yes [provider]  EUTHYROX 150 MCG tablet TAKE 1  TABLET BY MOUTH ONCE DAILY BEFORE BREAKFAST Patient taking differently: Take 150 mcg by mouth daily before breakfast. 01/04/21  Yes [name], FNP  hydrALAZINE (APRESOLINE) 100 MG tablet Take 1 tablet (100 mg total) by mouth 3 (three) times daily. 09/27/20  Yes Lorretta Harp, MD  Insulin Glargine [facility] KWIKPEN) 100 UNIT/ML Inject 15 Units into the skin daily. Only if above 150   Yes [provider]  latanoprost (XALATAN) 0.005 % ophthalmic solution Place 1 drop into both eyes at bedtime.    Yes [provider]  linagliptin (TRADJENTA) 5 MG TABS tablet Take 1 tablet (5 mg total) by mouth daily. 12/12/20  Yes [name], FNP  Multiple Vitamins-Minerals (CENTRUM SILVER 50+WOMEN) TABS Take 1 tablet by mouth daily.   Yes [provider]  polyethylene glycol (MIRALAX / GLYCOLAX) 17 g packet Take 17 g by mouth  daily. Patient taking differently: Take 17 g by mouth daily as needed for moderate constipation. 10/16/20  Yes Barb Merino, MD  sildenafil (REVATIO) 20 MG tablet Take 1 tablet (20 mg total) by mouth 3 (three) times daily. 09/06/20  Yes Bensimhon, Shaune Pascal, MD  blood glucose meter kit and supplies Dispense based on patient and insurance preference. Use up to four times daily as directed. (FOR ICD-10 E10.9, E11.9). Patient taking differently: 1 each by Other route See admin instructions. Dispense based on patient and insurance preference. Use up to four times daily as directed. (FOR ICD-10 E10.9, E11.9). 11/04/18   [name], FNP  Blood Glucose Monitoring Suppl (ONETOUCH ULTRALINK) w/Device KIT Check blood sugars twice daily E11.9 Patient taking differently: 1 each by Other route 2 (two) times daily. Check blood sugars twice daily E11.9 11/30/19   [name], FNP  fluticasone (FLONASE) 50 MCG/ACT nasal spray Place 1 spray into both nostrils daily. Patient not taking: No sig reported 01/28/20   Collene Gobble, MD  glucose blood test strip check blood sugars  twice daily Dx code E11.22 Patient taking differently: 1 each by Other route See admin instructions. check blood sugars twice daily Dx code E11.22 03/02/20   [name], FNP  Insulin Pen Needle 29G X 5MM MISC Use as directed Patient taking differently: 1 each by Other route as directed. 06/16/17   Bonnielee Haff, MD  Lancets (ONETOUCH DELICA PLUS [id number]) [place] check blood sugars twice daily Dx code: E11.22 Patient taking differently: 1 each by Other route See admin instructions. check blood sugars twice daily Dx code: E11.22 03/02/20   [name], FNP  omeprazole (PRILOSEC) 40 MG capsule TAKE 1 CAPSULE BY MOUTH TWICE DAILY BEFORE A MEAL Patient not taking: Reported on 01/13/2021 12/04/20   [name], FNP  oxyCODONE-acetaminophen (PERCOCET/ROXICET) 5-325 MG tablet Take 1 tablet by mouth every 6 (six) hours as needed. Patient not taking: Reported on 01/13/2021 12/13/20   Ulyses Amor, PA-C     Vitals:   01/13/21 1200 01/13/21 1252 01/13/21 1307 01/13/21 1330  BP: (!) 162/38 (!) 149/34 (!) 166/41 (!) 155/38  Pulse: 73 72 73 73  Resp: '16 15 15 15  ' Temp:  98.5 F (36.9 C) 98 F (36.7 C)   TempSrc:  Oral Oral   SpO2: 96%  98% 99%  Weight:      Height:       Exam Gen alert, no distress No rash, cyanosis or gangrene Sclera anicteric, throat clear  No jvd or bruits Chest clear bilat to bases, no rales/ wheezing RRR no RG 2/6 sem Abd soft ntnd no mass or ascites +bs GU defer MS no joint effusions or deformity Ext no LE or UE edema, no wounds or ulcers Neuro is alert, Ox 3 , nf  RIJ TDC and RUA AVF maturing +bruit    OP HD: TTS [facility]    4h  88.5kg  400/1.5   2/2 bath  Hep 2000  TDC (and maturing AVF)  - mircera 100 q2, last 6.30  - calcitriol 0.25 qd tiw     Assessment/ Plan: Symptomatic anemia - acute on chronic due to MDS and ESRD. Got 1u prbc earlier in ED, will give 2nd unit on HD later this evening.  Chest pain - hx CAD w/ noncritical disease, per pmd ESRD - usual HD  TTS.  HD later today or more likely tonight. Labs are ok.  HTN - BP's up Vol - lower vol as tol w/ HD tonight  3 L goal IDDM Severe AS/ moderate MS - sp LHC in April, TAVR was being considered by cardiology.  Pulm HTN - takes sildenafil PAF - taking amio, not an a/c candidate due to severe / recurrent anemia      Kelly Splinter  MD 01/13/2021, 2:33 PM  Recent Labs  Lab 01/13/21 0652  WBC 4.4  HGB 6.5*   Recent Labs  Lab 01/13/21 0652  K 3.9  BUN 62*  CREATININE 3.82*  CALCIUM 9.5

## 2021-01-13 NOTE — ED Notes (Signed)
MD notified if he wants to order blood transfusion for this pt. Hgb is 6.5. Will continue to monitor.

## 2021-01-13 NOTE — ED Provider Notes (Signed)
[facility] EMERGENCY DEPARTMENT Provider Note   CSN: [id number] Arrival date & time: 01/13/21  3354     History No chief complaint on file.   Patricia Mata is a 65 y.o. female.  Patient with a history of myelodysplastic syndrome, diabetes, end-stage renal disease on dialysis.  Normally dialyzed Tuesday Thursdays and Saturdays.  Was dialyzed on Thursday.  Did not go to dialysis today.  She is usually dialyzed at [facility].  Patient requires blood transfusions every 2 weeks.  But she had one a week ago.  And when her blood counts get low she gets some chest discomfort.  Patient also transfused 2 weeks ago at [facility] long ED where she got 2 units of blood and normally she transfused 2 units at a time.  Patient in no acute distress.  Patient is followed by hematology oncology at [facility].  Patient was told to follow-up here if she needed blood transfusion in the future because they have the ability to do dialysis as well.      Past Medical History:  Diagnosis Date   Acute renal failure (ARF) ([place]) 06/08/2018   TTHS- [place]   Carotid artery disease [facility])    Chronic diastolic (congestive) heart failure (HCC)    Colon cancer ([place]) 2008   Diabetes mellitus ([place])    Dyspnea    Gout 09/08/2018   Heart attack ([place])    Heart murmur    Hypertension    Hypothyroidism    Macrocytic anemia    MDS (myelodysplastic syndrome) (HCC)    Moderate mitral stenosis    PAD (peripheral artery disease) (HCC)    PAF (paroxysmal atrial fibrillation) (HCC)    Pulmonary hypertension (HCC)    Renal artery stenosis (HCC)    a. >60% R renal artery stenosis by duplex 2021, managed medically.   Severe aortic stenosis    Stage 4 chronic kidney disease (HCC)    Transfusion-dependent anemia     Patient Active Problem List   Diagnosis Date Noted   Anemia 01/13/2021   Obesity 11/23/2020   Joint pain 11/23/2020   Other long term (current) drug therapy 11/23/2020   Primary  osteoarthritis 11/23/2020   ESRD (end stage renal disease) ([place]) 11/22/2020   Nonrheumatic mitral (valve) stenosis 10/17/2020   Coagulation defect, unspecified ([place]) 56/25/6389   Complication of vascular dialysis catheter 10/14/2020   Peripheral vascular disease ([place]) 10/14/2020   Unspecified protein-calorie malnutrition ([place]) 10/14/2020   NSTEMI (non-ST elevated myocardial infarction) ([place]) 10/10/2020   Angina pectoris ([place]) 10/09/2020   Angina at rest ([place]) 10/09/2020   Gastroesophageal reflux disease 09/12/2020   History of malignant neoplasm of colon 09/12/2020   Chronic gastritis 09/02/2020   Symptomatic anemia 08/31/2020   Abnormal uterine bleeding (AUB) 07/19/2020   Vaginal ulceration 07/19/2020   Acute renal failure superimposed on stage 4 chronic kidney disease ([place])    Palliative care by specialist    Acute on chronic diastolic CHF (congestive heart failure) ([place]) 06/07/2020   Chronic cough 05/04/2020   Allergic rhinitis 03/23/2020   Chronic kidney disease, stage 5 ([place]) 37/34/2876   Acute diastolic CHF (congestive heart failure) ([place]) 09/21/2019   HCAP (healthcare-associated pneumonia) 09/20/2019   Pulmonary hypertension ([place]) 09/20/2019   DM2 (diabetes mellitus, type 2) ([place]) 09/20/2019   Elevated troponin 09/20/2019   Severe sepsis ([place]) 09/20/2019   CAP (community acquired pneumonia) 09/14/2019   Epistaxis, recurrent 08/10/2019   Neutropenia ([place]) 05/25/2019   Malignant neoplasm of colon ([place]) 05/25/2019  Bradycardia 03/21/2019   Pneumonia due to COVID-19 virus 03/18/2019   Type 2 diabetes mellitus with hemoglobin A1c goal of less than 7.0% ([place]) 12/10/2018   Hypothyroidism 12/10/2018   Seasonal allergies 12/10/2018   Gout 09/08/2018   Bacterial infection due to Morganella morganii    Sacral wound 06/08/2018   Iron deficiency anemia due to chronic blood loss 06/08/2018   Hyponatremia 06/08/2018   Constipation 06/08/2018   Encounter for nasogastric (NG) tube  placement    Pressure injury of skin 05/22/2018   PAF (paroxysmal atrial fibrillation) ([place]) 05/11/2018   Normocytic anemia 06/14/2017   Aortic stenosis, severe 06/10/2017   Carotid artery disease ([place]) 06/10/2017   Essential hypertension 05/13/2017   Hyperlipidemia 05/13/2017   Bilateral lower extremity edema 05/13/2017   Port-A-Cath in place 04/28/2017   MDS (myelodysplastic syndrome) ([place]) 03/20/2017   Goals of care, counseling/discussion 03/20/2017   Anemia in chronic kidney disease 05/10/2016   Anemia in stage 1 chronic kidney disease 05/10/2016   Anemia due to chronic kidney disease 02/09/2016    Past Surgical History:  Procedure Laterality Date   AV FISTULA PLACEMENT Right 09/26/2020   Procedure: RIGHT ARM ARTERIOVENOUS (AV) FISTULA CREATION;  Surgeon: Waynetta Sandy, MD;  Location: [place];  Service: Vascular;  Laterality: Right;  Monitor Anesthesia Care with Regional Block to Right Arm    [place] Right 12/13/2020   Procedure: RIGHT UPPER EXTREMITY ARTERIOVENOUS FISTULA TRANSPOSITION;  Surgeon: Waynetta Sandy, MD;  Location: [place];  Service: Vascular;  Laterality: Right;   COLON SURGERY     DEBRIDMENT OF DECUBITUS ULCER N/A 06/12/2018   Procedure: DEBRIDMENT OF DECUBITUS ULCER;  Surgeon: Wallace Going, DO;  Location: [facility];  Service: Plastics;  Laterality: N/A;   ESOPHAGOGASTRODUODENOSCOPY N/A 09/01/2020   Procedure: ESOPHAGOGASTRODUODENOSCOPY (EGD);  Surgeon: Clarene Essex, MD;  Location: [facility];  Service: Endoscopy;  Laterality: N/A;   GIVENS CAPSULE STUDY N/A 09/01/2020   Procedure: GIVENS CAPSULE STUDY;  Surgeon: Clarene Essex, MD;  Location: [facility];  Service: Endoscopy;  Laterality: N/A;   IR FLUORO GUIDE CV LINE RIGHT  10/11/2020   IR FLUORO GUIDE PORT INSERTION RIGHT  04/02/2017   IR REMOVAL TUN ACCESS W/ PORT W/O FL MOD SED  03/16/2019   IR US GUIDE VASC ACCESS RIGHT  04/02/2017   IR US GUIDE VASC ACCESS RIGHT  10/11/2020    LIGATION OF COMPETING BRANCHES OF ARTERIOVENOUS FISTULA Right 12/13/2020   Procedure: LIGATION OF COMPETING BRANCHES OF RIGHT UPPER EXTREMITY ARTERIOVENOUS FISTULA;  Surgeon: Waynetta Sandy, MD;  Location: [place];  Service: Vascular;  Laterality: Right;   RIGHT HEART CATH N/A 04/23/2019   Procedure: RIGHT HEART CATH;  Surgeon: Jolaine Artist, MD;  Location: [place] [facility];  Service: Cardiovascular;  Laterality: N/A;   RIGHT/LEFT HEART CATH AND CORONARY ANGIOGRAPHY N/A 05/21/2018   Procedure: RIGHT/LEFT HEART CATH AND CORONARY ANGIOGRAPHY;  Surgeon: Nelva Bush, MD;  Location: [place] CV LAB;  Service: Cardiovascular;  Laterality: N/A;   RIGHT/LEFT HEART CATH AND CORONARY ANGIOGRAPHY N/A 10/13/2020   Procedure: RIGHT/LEFT HEART CATH AND CORONARY ANGIOGRAPHY;  Surgeon: Jolaine Artist, MD;  Location: [place] CV LAB;  Service: Cardiovascular;  Laterality: N/A;   TUBAL LIGATION       OB History   No obstetric history on file.     Family History  Problem Relation Age of Onset   Hypertension Mother    Diabetes Mother    Cervical cancer Mother  Heart attack Father    Hypertension Sister    Hypertension Brother    Hypertension Sister    Hypertension Sister    Prostate cancer Brother    HIV/AIDS Brother     Social History   Tobacco Use   Smoking status: Former    Packs/day: 0.25    Years: 20.00    Pack years: 5.00    Types: Cigarettes    Quit date: 1997    Years since quitting: 25.5   Smokeless tobacco: Former  Scientific laboratory technician Use: Never used  Substance Use Topics   Alcohol use: Yes    Alcohol/week: 1.0 standard drink    Types: 1 Standard drinks or equivalent per week    Comment: occ   Drug use: Never    Home Medications Prior to Admission medications   Medication Sig Start Date End Date Taking? Authorizing Provider  allopurinol (ZYLOPRIM) 300 MG tablet Take 300 mg by mouth daily. 07/05/20  Yes [provider]  amiodarone  (PACERONE) 200 MG tablet Take 1 tablet (200 mg total) by mouth daily. 07/19/20  Yes Bensimhon, Shaune Pascal, MD  atorvastatin (LIPITOR) 40 MG tablet Take 1 tablet by mouth once daily Patient taking differently: Take 40 mg by mouth daily. 11/13/20  Yes [name], FNP  carvedilol (COREG) 3.125 MG tablet Take 1 tablet (3.125 mg total) by mouth 2 (two) times daily. 11/27/20  Yes Clegg, Amy D, NP  cholecalciferol (VITAMIN D3) 25 MCG (1000 UNIT) tablet Take 1,000 Units by mouth daily.   Yes [provider]  cloNIDine (CATAPRES) 0.1 MG tablet Take 1 tablet (0.1 mg total) by mouth 2 (two) times daily. 07/03/20  Yes Bensimhon, Shaune Pascal, MD  colchicine 0.6 MG tablet Take 0.6 mg by mouth daily as needed (gout).   Yes [provider]  EUTHYROX 150 MCG tablet TAKE 1 TABLET BY MOUTH ONCE DAILY BEFORE BREAKFAST Patient taking differently: Take 150 mcg by mouth daily before breakfast. 01/04/21  Yes [name], FNP  hydrALAZINE (APRESOLINE) 100 MG tablet Take 1 tablet (100 mg total) by mouth 3 (three) times daily. 09/27/20  Yes Lorretta Harp, MD  Insulin Glargine [facility] KWIKPEN) 100 UNIT/ML Inject 15 Units into the skin daily. Only if above 150   Yes [provider]  latanoprost (XALATAN) 0.005 % ophthalmic solution Place 1 drop into both eyes at bedtime.    Yes [provider]  linagliptin (TRADJENTA) 5 MG TABS tablet Take 1 tablet (5 mg total) by mouth daily. 12/12/20  Yes [name], FNP  Multiple Vitamins-Minerals (CENTRUM SILVER 50+WOMEN) TABS Take 1 tablet by mouth daily.   Yes [provider]  polyethylene glycol (MIRALAX / GLYCOLAX) 17 g packet Take 17 g by mouth daily. Patient taking differently: Take 17 g by mouth daily as needed for moderate constipation. 10/16/20  Yes Barb Merino, MD  sildenafil (REVATIO) 20 MG tablet Take 1 tablet (20 mg total) by mouth 3 (three) times daily. 09/06/20  Yes Bensimhon, Shaune Pascal, MD  blood glucose meter kit and supplies  Dispense based on patient and insurance preference. Use up to four times daily as directed. (FOR ICD-10 E10.9, E11.9). Patient taking differently: 1 each by Other route See admin instructions. Dispense based on patient and insurance preference. Use up to four times daily as directed. (FOR ICD-10 E10.9, E11.9). 11/04/18   [name], FNP  Blood Glucose Monitoring Suppl (ONETOUCH ULTRALINK) w/Device KIT Check blood sugars twice daily E11.9 Patient taking differently: 1 each  by Other route 2 (two) times daily. Check blood sugars twice daily E11.9 11/30/19   [name], FNP  fluticasone (FLONASE) 50 MCG/ACT nasal spray Place 1 spray into both nostrils daily. Patient not taking: No sig reported 01/28/20   Collene Gobble, MD  glucose blood test strip check blood sugars twice daily Dx code E11.22 Patient taking differently: 1 each by Other route See admin instructions. check blood sugars twice daily Dx code E11.22 03/02/20   [name], FNP  Insulin Pen Needle 29G X 5MM MISC Use as directed Patient taking differently: 1 each by Other route as directed. 06/16/17   Bonnielee Haff, MD  Lancets (ONETOUCH DELICA PLUS [id number]) [place] check blood sugars twice daily Dx code: E11.22 Patient taking differently: 1 each by Other route See admin instructions. check blood sugars twice daily Dx code: E11.22 03/02/20   [name], FNP  omeprazole (PRILOSEC) 40 MG capsule TAKE 1 CAPSULE BY MOUTH TWICE DAILY BEFORE A MEAL Patient not taking: Reported on 01/13/2021 12/04/20   [name], FNP  oxyCODONE-acetaminophen (PERCOCET/ROXICET) 5-325 MG tablet Take 1 tablet by mouth every 6 (six) hours as needed. Patient not taking: Reported on 01/13/2021 12/13/20   Ulyses Amor, PA-C    Allergies    Ancef [cefazolin], Lactose intolerance (gi), Morphine and related, and Sulfa antibiotics  Review of Systems   Review of Systems  Constitutional:  Positive for fatigue. Negative for chills and fever.  HENT:  Negative for ear  pain and sore throat.   Eyes:  Negative for pain and visual disturbance.  Respiratory:  Negative for cough and shortness of breath.   Cardiovascular:  Positive for chest pain. Negative for palpitations.  Gastrointestinal:  Negative for abdominal pain and vomiting.  Genitourinary:  Negative for dysuria and hematuria.  Musculoskeletal:  Negative for arthralgias and back pain.  Skin:  Negative for color change and rash.  Neurological:  Negative for seizures and syncope.  All other systems reviewed and are negative.  Physical Exam Updated Vital Signs BP (!) 169/35 (BP Location: Right Arm)   Pulse 79   Temp 98.6 F (37 C) (Oral)   Resp 20   Ht 1.626 m (_0 )   Wt 89.4 kg   SpO2 96%   BMI 33.81 kg/m   Physical Exam Vitals and nursing note reviewed.  Constitutional:      General: She is not in acute distress.    Appearance: She is well-developed.  HENT:     Head: Normocephalic and atraumatic.  Eyes:     Extraocular Movements: Extraocular movements intact.     Conjunctiva/sclera: Conjunctivae normal.     Pupils: Pupils are equal, round, and reactive to light.  Cardiovascular:     Rate and Rhythm: Normal rate and regular rhythm.     Heart sounds: No murmur heard. Pulmonary:     Effort: Pulmonary effort is normal. No respiratory distress.     Breath sounds: Normal breath sounds.  Abdominal:     Palpations: Abdomen is soft.     Tenderness: There is no abdominal tenderness.  Musculoskeletal:        General: No swelling. Normal range of motion.     Cervical back: Neck supple.  Skin:    General: Skin is warm and dry.  Neurological:     General: No focal deficit present.     Mental Status: She is alert and oriented to person, place, and time.     Cranial Nerves: No cranial nerve deficit.  Sensory: No sensory deficit.     Motor: No weakness.    ED Results / Procedures / Treatments   Labs (all labs ordered are listed, but only abnormal results are displayed) Labs  Reviewed  BASIC METABOLIC PANEL - Abnormal; Notable for the following components:      Result Value   Sodium 132 (*)    Chloride 93 (*)    Glucose, Bld 158 (*)    BUN 62 (*)    Creatinine, Ser 3.82 (*)    GFR, Estimated 12 (*)    Anion gap 16 (*)    All other components within normal limits  CBC - Abnormal; Notable for the following components:   RBC 2.15 (*)    Hemoglobin 6.5 (*)    HCT 19.2 (*)    RDW 18.2 (*)    nRBC 0.5 (*)    All other components within normal limits  TROPONIN I (HIGH SENSITIVITY) - Abnormal; Notable for the following components:   Troponin I (High Sensitivity) 40 (*)    All other components within normal limits  TROPONIN I (HIGH SENSITIVITY) - Abnormal; Notable for the following components:   Troponin I (High Sensitivity) 45 (*)    All other components within normal limits  SARS CORONAVIRUS 2 (TAT 6-24 HRS)  HEMOGLOBIN A1C  CBC  BASIC METABOLIC PANEL  TYPE AND SCREEN  PREPARE RBC (CROSSMATCH)    EKG None  Radiology DG Chest 2 View  Result Date: 01/13/2021 CLINICAL DATA:  65 year old female with left side chest pain since 0400 hours. EXAM: CHEST - 2 VIEW COMPARISON:  Portable chest 12/26/2020 and earlier. FINDINGS: PA and lateral views of the chest today. Stable right chest dual lumen dialysis catheter. Stable borderline cardiomegaly. Other mediastinal contours are within normal limits. Visualized tracheal air column is within normal limits. No pneumothorax or pleural effusion. No pulmonary edema or confluent pulmonary opacity. No acute osseous abnormality identified. Abdominal Calcified aortic atherosclerosis. Negative visible bowel gas pattern. IMPRESSION: No acute cardiopulmonary abnormality. Aortic Atherosclerosis (ICD10-I70.0). Electronically Signed   By: Genevie Ann M.D.   On: 01/13/2021 07:57    Procedures Procedures   CRITICAL CARE Performed by: Fredia Sorrow Total critical care time: 45 minutes Critical care time was exclusive of separately  billable procedures and treating other patients. Critical care was necessary to treat or prevent imminent or life-threatening deterioration. Critical care was time spent personally by me on the following activities: development of treatment plan with patient and/or surrogate as well as nursing, discussions with consultants, evaluation of patient's response to treatment, examination of patient, obtaining history from patient or surrogate, ordering and performing treatments and interventions, ordering and review of laboratory studies, ordering and review of radiographic studies, pulse oximetry and re-evaluation of patient's condition.   Medications Ordered in ED Medications  heparin injection 5,000 Units (5,000 Units Subcutaneous Not Given 01/13/21 1301)  allopurinol (ZYLOPRIM) tablet 300 mg (has no administration in time range)  colchicine tablet 0.6 mg (has no administration in time range)  amiodarone (PACERONE) tablet 200 mg (has no administration in time range)  atorvastatin (LIPITOR) tablet 40 mg (has no administration in time range)  carvedilol (COREG) tablet 3.125 mg (3.125 mg Oral Given 01/13/21 1344)  cloNIDine (CATAPRES) tablet 0.1 mg (0.1 mg Oral Given 01/13/21 1344)  hydrALAZINE (APRESOLINE) tablet 100 mg (has no administration in time range)  sildenafil (REVATIO) tablet 20 mg (has no administration in time range)  levothyroxine (SYNTHROID) tablet 150 mcg (has no administration in time range)  linagliptin (  TRADJENTA) tablet 5 mg (has no administration in time range)  insulin glargine (LANTUS) injection 15 Units (has no administration in time range)  polyethylene glycol (MIRALAX / GLYCOLAX) packet 17 g (17 g Oral Patient Refused/Not Given 01/13/21 1328)  latanoprost (XALATAN) 0.005 % ophthalmic solution 1 drop (has no administration in time range)  insulin aspart (novoLOG) injection 0-6 Units (has no administration in time range)  Chlorhexidine Gluconate Cloth 2 % PADS 6 each (has no  administration in time range)  0.9 %  sodium chloride infusion (10 mL/hr Intravenous New Bag/Given 01/13/21 1222)    ED Course  I have reviewed the triage vital signs and the nursing notes.  Pertinent labs & imaging results that were available during my care of the patient were reviewed by me and considered in my medical decision making (see chart for details).    MDM Rules/Calculators/A&P                          Patient with a hemoglobin of 6.5 today.  On June 22 it was 7.6.  Patient's first troponin was 40.  Patient was due for dialysis today.  So spoke to nephrology.  Is clear that she is got a 2 unit blood transfusion nephrology says they will probably do dialysis and blood transfusion the same time.  Contacted the hospitalist service they will admit.  Patient with symptomatic anemia requires 2 unit blood transfusion this has been ordered.  Final Clinical Impression(s) / ED Diagnoses Final diagnoses:  Symptomatic anemia  ESRD on hemodialysis [facility])    Rx / DC Orders ED Discharge Orders     None        Fredia Sorrow, MD 01/13/21 1805

## 2021-01-13 NOTE — ED Notes (Signed)
Admit team paged at this time. Pt needs dialysis today as she gets [facility] HD. No current orders per dialysis and pt is not on schedule. Pending admit team to answer page.

## 2021-01-13 NOTE — ED Notes (Signed)
Blood bank called. Blood is not ready as of this time.

## 2021-01-14 DIAGNOSIS — N186 End stage renal disease: Secondary | ICD-10-CM

## 2021-01-14 DIAGNOSIS — Z992 Dependence on renal dialysis: Secondary | ICD-10-CM

## 2021-01-14 DIAGNOSIS — D649 Anemia, unspecified: Secondary | ICD-10-CM | POA: Diagnosis not present

## 2021-01-14 LAB — BASIC METABOLIC PANEL
Anion gap: 9 (ref 5–15)
BUN: 27 mg/dL — ABNORMAL HIGH (ref 8–23)
CO2: 28 mmol/L (ref 22–32)
Calcium: 9.3 mg/dL (ref 8.9–10.3)
Chloride: 96 mmol/L — ABNORMAL LOW (ref 98–111)
Creatinine, Ser: 2.21 mg/dL — ABNORMAL HIGH (ref 0.44–1.00)
GFR, Estimated: 24 mL/min — ABNORMAL LOW (ref >60)
Glucose, Bld: 129 mg/dL — ABNORMAL HIGH (ref 70–99)
Potassium: 3.8 mmol/L (ref 3.5–5.1)
Sodium: 133 mmol/L — ABNORMAL LOW (ref 135–145)

## 2021-01-14 LAB — TYPE AND SCREEN
ABO/RH(D): A NEG
Antibody Screen: NEGATIVE
Unit division: 0
Unit division: 0

## 2021-01-14 LAB — CBC
HCT: 24 % — ABNORMAL LOW (ref 36.0–46.0)
Hemoglobin: 8.3 g/dL — ABNORMAL LOW (ref 12.0–15.0)
MCH: 29.3 pg (ref 26.0–34.0)
MCHC: 34.6 g/dL (ref 30.0–36.0)
MCV: 84.8 fL (ref 80.0–100.0)
Platelets: 220 10*3/uL (ref 150–400)
RBC: 2.83 MIL/uL — ABNORMAL LOW (ref 3.87–5.11)
RDW: 17.6 % — ABNORMAL HIGH (ref 11.5–15.5)
WBC: 6 10*3/uL (ref 4.0–10.5)
nRBC: 0.3 % — ABNORMAL HIGH (ref 0.0–0.2)

## 2021-01-14 LAB — GLUCOSE, CAPILLARY
Glucose-Capillary: 103 mg/dL — ABNORMAL HIGH (ref 70–99)
Glucose-Capillary: 140 mg/dL — ABNORMAL HIGH (ref 70–99)
Glucose-Capillary: 178 mg/dL — ABNORMAL HIGH (ref 70–99)

## 2021-01-14 LAB — BPAM RBC
Blood Product Expiration Date: 202207182359
Blood Product Expiration Date: 202207182359
ISSUE DATE / TIME: 202207021244
ISSUE DATE / TIME: 202207021447
Unit Type and Rh: 6200
Unit Type and Rh: 6200

## 2021-01-14 MED ORDER — POLYETHYLENE GLYCOL 3350 17 G PO PACK: 17.0000 g | PACK | Freq: Every day | ORAL | Status: AC | PRN

## 2021-01-14 MED ORDER — MELATONIN 5 MG PO TABS
5.0000 mg | ORAL_TABLET | Freq: Every evening | ORAL | Status: DC | PRN
  Administered 2021-01-14: 5 mg via ORAL
  Filled 2021-01-14: qty 1

## 2021-01-14 NOTE — Plan of Care (Signed)
  Problem: Education: Goal: Knowledge of General Education information will improve Description: Including pain rating scale, medication(s)/side effects and non-pharmacologic comfort measures Outcome: Adequate for Discharge   Problem: Health Behavior/Discharge Planning: Goal: Ability to manage health-related needs will improve Outcome: Adequate for Discharge   Problem: Clinical Measurements: Goal: Ability to maintain clinical measurements within normal limits will improve Outcome: Adequate for Discharge Goal: Will remain free from infection Outcome: Adequate for Discharge Goal: Diagnostic test results will improve Outcome: Adequate for Discharge Goal: Respiratory complications will improve Outcome: Adequate for Discharge Goal: Cardiovascular complication will be avoided Outcome: Adequate for Discharge   Problem: Activity: Goal: Risk for activity intolerance will decrease Outcome: Adequate for Discharge   Problem: Nutrition: Goal: Adequate nutrition will be maintained Outcome: Adequate for Discharge   Problem: Coping: Goal: Level of anxiety will decrease Outcome: Adequate for Discharge   Problem: Pain Managment: Goal: General experience of comfort will improve Outcome: Adequate for Discharge   Problem: Skin Integrity: Goal: Risk for impaired skin integrity will decrease Outcome: Adequate for Discharge   Problem: Education: Goal: Knowledge of disease and its progression will improve Outcome: Adequate for Discharge Goal: Individualized Educational Video(s) Outcome: Adequate for Discharge   Problem: Health Behavior/Discharge Planning: Goal: Ability to manage health-related needs will improve Outcome: Adequate for Discharge   Problem: Clinical Measurements: Goal: Complications related to the disease process, condition or treatment will be avoided or minimized Outcome: Adequate for Discharge

## 2021-01-14 NOTE — Discharge Summary (Signed)
Physician Discharge Summary  Angalina Ante NKN:[id number] DOB: [birth year] DOA: 01/13/2021  PCP: [name], FNP  Admit date: 01/13/2021 Discharge date: 01/14/2021  Admitted From: home Discharge disposition: home   Recommendations for Outpatient Follow-Up:    Cbc 1 week Outpatient GI follow up   Discharge Diagnosis:   Active Problems:   Anemia due to chronic kidney disease   Anemia    Discharge Condition: Improved.  Diet recommendation: renal  Wound care: None.  Code status: Full.   History of Present Illness:   Patricia Mata is a 65 y.o. 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  This time, patient started to experience fatigue for the last 3 days, denies any shortness of breath, no fever chills no cough.  This morning, patient woke up with new onset of sharp-like chest pain, retrosternal radiating to left shoulder and arm, but denied any associated symptoms such as shortness of breath palpitations or sweating.   Hospital Course by Problem:   Symptomatic anemia acute on chronic -Secondary to MDS and ESRD -patient states she has already had a full GI work up -s/p 2 units- hgb stable  Chest pain -Secondary to symptomatic anemia, ACS rule out.  Patient had a cardiac cath April 2022 showing multivessel noncritical obstructions.   ESRD on HD -HD saturday   HTN -Fairly controlled, continue home BP regimen, Coreg, hydralazine and clonidine   IDDM -Continue home regimen of insulin sliding scale.   Severe  aortic stenosis/moderate mitral stenosis -As per cardiology evaluation after cardiac cath in 10/2020, Consider anatomy suitable for TAVR, recommend outpatient follow-up with structural heart team.   Severe pulmonary hypertension -Continue Sidenafil 3 times daily   PAF -On amiodarone, not a candidate for systemic anticoagulation due to severe transfusion dependent anemia.    Medical Consultants:   renal   Discharge Exam:   Vitals:   01/13/21 2337 01/14/21 0512  BP: (!) 178/52 (!) 148/45  Pulse: 67 67  Resp: 16 18  Temp: 98.7 F (37.1 C) 98.3 F (36.8 C)  SpO2: 100% 100%   Vitals:   01/13/21 2221 01/13/21 2236 01/13/21 2337 01/14/21 0512  BP: (!) 162/42  (!) 178/52 (!) 148/45  Pulse: 79  67 67  Resp: _0 Temp:  98.6 F (37 C) 98.7 F (37.1 C) 98.3 F (36.8 C)  TempSrc:  Oral Oral Oral  SpO2: 99%  100% 100%  Weight:      Height:   _1  (1.626 m)     General exam: Appears calm and comfortable. 35 The results of significant diagnostics from this hospitalization (including imaging, microbiology, ancillary and laboratory) are listed below for reference.     Procedures and Diagnostic Studies:   DG Chest 2 View  Result Date: 01/13/2021 CLINICAL DATA:  65 year old female with left side chest pain since 0400 hours. EXAM: CHEST - 2 VIEW COMPARISON:  Portable chest 12/26/2020 and earlier. FINDINGS: PA and lateral views of the chest today. Stable right chest dual lumen dialysis catheter. Stable borderline cardiomegaly. Other mediastinal contours are within normal limits. Visualized  tracheal air column is within normal limits. No pneumothorax or pleural effusion. No pulmonary edema or confluent pulmonary opacity. No acute osseous abnormality identified. Abdominal Calcified aortic atherosclerosis. Negative visible bowel gas pattern. IMPRESSION: No acute cardiopulmonary abnormality. Aortic Atherosclerosis (ICD10-I70.0). Electronically Signed   By: Genevie Ann M.D.   On: 01/13/2021  07:57     Labs:   Basic Metabolic Panel: Recent Labs  Lab 01/13/21 0652 01/14/21 0443  NA 132* 133*  K 3.9 3.8  CL 93* 96*  CO2 23 28  GLUCOSE 158* 129*  BUN 62* 27*  CREATININE 3.82* 2.21*  CALCIUM 9.5 9.3   GFR Estimated Creatinine Clearance: 27.1 mL/min (A) (by C-G formula based on SCr of 2.21 mg/dL (H)). Liver Function Tests: No results for input(s): AST, ALT, ALKPHOS, BILITOT, PROT, ALBUMIN in the last 168 hours. No results for input(s): LIPASE, AMYLASE in the last 168 hours. No results for input(s): AMMONIA in the last 168 hours. Coagulation profile No results for input(s): INR, PROTIME in the last 168 hours.  CBC: Recent Labs  Lab 01/13/21 0652 01/14/21 0443  WBC 4.4 6.0  HGB 6.5* 8.3*  HCT 19.2* 24.0*  MCV 89.3 84.8  PLT 250 220   Cardiac Enzymes: No results for input(s): CKTOTAL, CKMB, CKMBINDEX, TROPONINI in the last 168 hours. BNP: Invalid input(s): POCBNP CBG: Recent Labs  Lab 01/13/21 2335 01/14/21 0641  GLUCAP 103* 140*   D-Dimer No results for input(s): DDIMER in the last 72 hours. Hgb A1c Recent Labs    01/13/21 2327  HGBA1C 6.1*   Lipid Profile No results for input(s): CHOL, HDL, LDLCALC, TRIG, CHOLHDL, LDLDIRECT in the last 72 hours. Thyroid function studies No results for input(s): TSH, T4TOTAL, T3FREE, THYROIDAB in the last 72 hours.  Invalid input(s): FREET3 Anemia work up No results for input(s): VITAMINB12, FOLATE, FERRITIN, TIBC, IRON, RETICCTPCT in the last 72 hours. Microbiology Recent Results (from the past 240 hour(s))  SARS CORONAVIRUS 2 (TAT 6-24 HRS) Nasopharyngeal Nasopharyngeal Swab     Status: None   Collection Time: 01/13/21 12:07 PM   Specimen: Nasopharyngeal Swab  Result Value Ref Range Status   SARS Coronavirus 2 NEGATIVE NEGATIVE Final    Comment: (NOTE) SARS-CoV-2 target nucleic acids are NOT DETECTED.  The SARS-CoV-2 RNA is generally detectable in upper and lower respiratory specimens during the  acute phase of infection. Negative results do not preclude SARS-CoV-2 infection, do not rule out co-infections with other pathogens, and should not be used as the sole basis for treatment or other patient management decisions. Negative results must be combined with clinical observations, patient history, and epidemiological information. The expected result is Negative.  Fact Sheet for Patients: SugarRoll.be  Fact Sheet for Healthcare Providers: https://www.woods-mathews.com/  This test is not yet approved or cleared by the [place] FDA and  has been authorized for detection and/or diagnosis of SARS-CoV-2 by FDA under an Emergency Use Authorization (EUA). This EUA will remain  in effect (meaning this test can be used) for the duration of the COVID-19 declaration under Se ction 564(b)(1) of the Act, 21 U.S.C. section 360bbb-3(b)(1), unless the authorization is terminated or revoked sooner.  Performed at [facility], [place] [address], [address], [place] [address]      Discharge Instructions:   Discharge Instructions     Discharge instructions   Complete by: As directed    Cbc 1 week Renal/carb mod   Increase activity slowly   Complete by: As directed  Allergies as of 01/14/2021       Reactions   Ancef [cefazolin] Itching   Severe itching- after procedure, ancef was the antibiotic.-04/02/17 Tolerates penicillins   Lactose Intolerance (gi) Diarrhea   Morphine And Related Itching   Sulfa Antibiotics Rash, Other (See Comments)   Blisters, also        Medication List     STOP taking these medications    fluticasone 50 MCG/ACT nasal spray Commonly known as: FLONASE   omeprazole 40 MG capsule Commonly known as: PRILOSEC   oxyCODONE-acetaminophen 5-325 MG tablet Commonly known as: PERCOCET/ROXICET       TAKE these medications    allopurinol 300 MG tablet Commonly known as: ZYLOPRIM Take 300 mg by mouth  daily.   amiodarone 200 MG tablet Commonly known as: PACERONE Take 1 tablet (200 mg total) by mouth daily.   atorvastatin 40 MG tablet Commonly known as: LIPITOR Take 1 tablet by mouth once daily   Basaglar KwikPen 100 UNIT/ML Inject 15 Units into the skin daily. Only if above 150   blood glucose meter kit and supplies Dispense based on patient and insurance preference. Use up to four times daily as directed. (FOR ICD-10 E10.9, E11.9). What changed:  how much to take how to take this when to take this   carvedilol 3.125 MG tablet Commonly known as: COREG Take 1 tablet (3.125 mg total) by mouth 2 (two) times daily.   Centrum Silver 50+Women Tabs Take 1 tablet by mouth daily.   cholecalciferol 25 MCG (1000 UNIT) tablet Commonly known as: VITAMIN D3 Take 1,000 Units by mouth daily.   cloNIDine 0.1 MG tablet Commonly known as: Catapres Take 1 tablet (0.1 mg total) by mouth 2 (two) times daily.   colchicine 0.6 MG tablet Take 0.6 mg by mouth daily as needed (gout).   Euthyrox 150 MCG tablet Generic drug: levothyroxine TAKE 1 TABLET BY MOUTH ONCE DAILY BEFORE BREAKFAST What changed: how much to take   glucose blood test strip check blood sugars twice daily Dx code E11.22 What changed:  how much to take how to take this when to take this   hydrALAZINE 100 MG tablet Commonly known as: APRESOLINE Take 1 tablet (100 mg total) by mouth 3 (three) times daily.   Insulin Pen Needle 29G X 5MM Misc Use as directed What changed:  how much to take how to take this when to take this additional instructions   latanoprost 0.005 % ophthalmic solution Commonly known as: XALATAN Place 1 drop into both eyes at bedtime.   linagliptin 5 MG Tabs tablet Commonly known as: Tradjenta Take 1 tablet (5 mg total) by mouth daily.   OneTouch Delica Plus [id number] Misc check blood sugars twice daily Dx code: E11.22 What changed:  how much to take how to take this when to take  this   OneTouch UltraLink w/Device Kit Check blood sugars twice daily E11.9 What changed:  how much to take how to take this when to take this   polyethylene glycol 17 g packet Commonly known as: MIRALAX / GLYCOLAX Take 17 g by mouth daily as needed for moderate constipation.   sildenafil 20 MG tablet Commonly known as: REVATIO Take 1 tablet (20 mg total) by mouth 3 (three) times daily.        Follow-up Information     [name], FNP Follow up.   Specialty: General Practice Contact information: [address] [place] [address] [phone]  Otis Brace, MD Follow up.   Specialty: Gastroenterology Contact information: [place] [address] [phone]                  Time coordinating discharge: 35 min  Signed:  Geradine Girt DO  [facility] 01/14/2021, 10:50 AM

## 2021-01-14 NOTE — Progress Notes (Signed)
[facility] Progress Note  Subjective: had HD overnight w/ 2nd unit prbc's transfused. Feelign "much better" this am.   Vitals:   01/13/21 2236 01/13/21 2337 01/14/21 0512 01/14/21 1110  BP:  (!) 178/52 (!) 148/45 (!) 151/48  Pulse:  67 67 67  Resp:  16 18 16   Temp: 98.6 F (37 C) 98.7 F (37.1 C) 98.3 F (36.8 C) 98.2 F (36.8 C)  TempSrc: Oral Oral Oral Oral  SpO2:  100% 100% 97%  Weight:      Height:  5\' 4"  (1.626 m)      Exam: Gen alert, no distress No rash, cyanosis or gangrene Sclera anicteric, throat clear No jvd or bruits Chest clear bilat to bases, no rales/ wheezing RRR no RG 2/6 sem Abd soft ntnd no mass or ascites +bs GU defer MS no joint effusions or deformity Ext no LE or UE edema, no wounds or ulcers Neuro is alert, Ox 3 , nf  RIJ TDC and RUA AVF maturing +bruit      OP HD: TTS [facility]    4h  88.5kg  400/1.5   2/2 bath  Hep 2000  TDC (and maturing AVF)  - mircera 100 q2, last 6.30  - calcitriol 0.25 qd tiw       Assessment/ Plan: Symptomatic anemia - acute on chronic due to MDS and ESRD. Got 2u prbc's yesterday and feeling much better. For DC home today.  Chest pain - hx CAD w/ noncritical disease, per pmd ESRD - usual HD TTS.  HD last night.  HTN - BP's up Vol - stable on exam IDDM Severe AS/ moderate MS - sp LHC in April, TAVR was being considered by cardiology. Pulm HTN - takes sildenafil PAF - taking amio, not an a/c candidate due to severe / recurrent anemia       Rob Lesia Monica 01/14/2021, 5:05 PM   Recent Labs  Lab 01/13/21 0652 01/14/21 0443  K 3.9 3.8  BUN 62* 27*  CREATININE 3.82* 2.21*  CALCIUM 9.5 9.3  HGB 6.5* 8.3*   Inpatient medications:  allopurinol  300 mg Oral Daily   amiodarone  200 mg Oral Daily   atorvastatin  40 mg Oral Daily   carvedilol  3.125 mg Oral BID   Chlorhexidine Gluconate Cloth  6 each Topical Q0600   cloNIDine  0.1 mg Oral BID   heparin  5,000 Units Subcutaneous Q8H   hydrALAZINE   100 mg Oral TID   insulin aspart  0-6 Units Subcutaneous TID WC   insulin glargine  15 Units Subcutaneous Daily   latanoprost  1 drop Both Eyes QHS   levothyroxine  150 mcg Oral Q0600   linagliptin  5 mg Oral Daily   polyethylene glycol  17 g Oral Daily   sildenafil  20 mg Oral TID    colchicine, melatonin

## 2021-01-14 NOTE — Progress Notes (Signed)
DISCHARGE NOTE HOME Patricia Mata to be discharged Home per MD order. Discussed prescriptions and follow up appointments with the patient. Prescriptions given to patient; medication list explained in detail. Patient verbalized understanding.  Skin clean, dry and intact without evidence of skin break down, no evidence of skin tears noted. IV catheter discontinued intact. Site without signs and symptoms of complications. Dressing and pressure applied. Pt denies pain at the site currently. No complaints noted.  Patient free of lines, drains, and wounds.   An After Visit Summary (AVS) was printed and given to the patient. Patient escorted via wheelchair, and discharged home via private auto.  [place], Zenon Mayo, RN

## 2021-01-15 ENCOUNTER — Telehealth (HOSPITAL_COMMUNITY): Payer: Self-pay | Admitting: Nephrology

## 2021-01-15 NOTE — Telephone Encounter (Signed)
Transition of care contact from inpatient facility  Date of Discharge: 01/14/2021 Date of Contact: attempted - 01/15/21 Method of contact: Phone  Attempted to contact patient to discuss transition of care from inpatient admission. Patient did not answer the phone. Message was left on the patient's voicemail with call back number [phone].  Veneta Penton, PA-C [name] Pager [phone]

## 2021-01-16 ENCOUNTER — Telehealth: Payer: Self-pay

## 2021-01-16 NOTE — Telephone Encounter (Signed)
Transition Care Management Unsuccessful Follow-up Telephone Call  Date of discharge and from where:  01/13/2021 [facility]   Attempts:  1st Attempt  Reason for unsuccessful TCM follow-up call:  Left voice message

## 2021-01-17 ENCOUNTER — Telehealth: Payer: Self-pay

## 2021-01-17 NOTE — Telephone Encounter (Signed)
Transition Care Management Follow-up Telephone Call Date of discharge and from where: 01/13/2021 [facility]  How have you been since you were released from the hospital? Pt said she feels quite well. Any questions or concerns? No   Items Reviewed: Did the pt receive and understand the discharge instructions provided? No  Medications obtained and verified? No  Other? No  Any new allergies since your discharge? No  Dietary orders reviewed? Yes Do you have support at home? Yes   Home Care and Equipment/Supplies: Were home health services ordered? no If so, what is the name of the agency? N/a   Has the agency set up a time to come to the patient's home? not applicable Were any new equipment or medical supplies ordered?  No What is the name of the medical supply agency? N/a  Were you able to get the supplies/equipment? not applicable Do you have any questions related to the use of the equipment or supplies? No  Functional Questionnaire: (I = Independent and D = Dependent) ADLs: I  Bathing/Dressing- I  Meal Prep- I  Eating- I  Maintaining continence- I  Transferring/Ambulation- I  Managing Meds- I  Follow up appointments reviewed:  PCP Hospital f/u appt confirmed? Yes  Scheduled to see [name] on 01/31/2021 @ 3:45 [facility]. [facility] f/u appt confirmed? No  Scheduled to see n/a on nn/a @ n/a . Are transportation arrangements needed? No  If their condition worsens, is the pt aware to call PCP or go to the Emergency Dept.? Yes Was the patient provided with contact information for the PCP's office or ED? Yes Was to pt encouraged to call back with questions or concerns? Yes

## 2021-01-22 ENCOUNTER — Other Ambulatory Visit: Payer: Self-pay

## 2021-01-22 ENCOUNTER — Inpatient Hospital Stay: Payer: Medicare Other

## 2021-01-22 ENCOUNTER — Other Ambulatory Visit: Payer: Self-pay | Admitting: *Deleted

## 2021-01-22 ENCOUNTER — Inpatient Hospital Stay (HOSPITAL_BASED_OUTPATIENT_CLINIC_OR_DEPARTMENT_OTHER): Payer: Medicare Other | Admitting: Oncology

## 2021-01-22 ENCOUNTER — Inpatient Hospital Stay: Payer: Medicare Other | Attending: Oncology

## 2021-01-22 VITALS — BP 125/39 | HR 79 | Temp 98.2°F | Resp 19 | Ht 64.0 in | Wt 200.1 lb

## 2021-01-22 VITALS — BP 160/39 | HR 60 | Temp 98.1°F | Resp 18

## 2021-01-22 DIAGNOSIS — D649 Anemia, unspecified: Secondary | ICD-10-CM

## 2021-01-22 DIAGNOSIS — D469 Myelodysplastic syndrome, unspecified: Secondary | ICD-10-CM

## 2021-01-22 DIAGNOSIS — D631 Anemia in chronic kidney disease: Secondary | ICD-10-CM | POA: Diagnosis not present

## 2021-01-22 DIAGNOSIS — D464 Refractory anemia, unspecified: Secondary | ICD-10-CM | POA: Insufficient documentation

## 2021-01-22 DIAGNOSIS — N184 Chronic kidney disease, stage 4 (severe): Secondary | ICD-10-CM

## 2021-01-22 DIAGNOSIS — Z79899 Other long term (current) drug therapy: Secondary | ICD-10-CM | POA: Diagnosis not present

## 2021-01-22 DIAGNOSIS — E039 Hypothyroidism, unspecified: Secondary | ICD-10-CM

## 2021-01-22 LAB — SAMPLE TO BLOOD BANK

## 2021-01-22 LAB — CBC WITH DIFFERENTIAL (CANCER CENTER ONLY)
Abs Immature Granulocytes: 0.02 10*3/uL (ref 0.00–0.07)
Basophils Absolute: 0 10*3/uL (ref 0.0–0.1)
Basophils Relative: 0 %
Eosinophils Absolute: 0 10*3/uL (ref 0.0–0.5)
Eosinophils Relative: 1 %
HCT: 20 % — ABNORMAL LOW (ref 36.0–46.0)
Hemoglobin: 6.8 g/dL — CL (ref 12.0–15.0)
Immature Granulocytes: 1 %
Lymphocytes Relative: 10 %
Lymphs Abs: 0.4 10*3/uL — ABNORMAL LOW (ref 0.7–4.0)
MCH: 30.1 pg (ref 26.0–34.0)
MCHC: 34 g/dL (ref 30.0–36.0)
MCV: 88.5 fL (ref 80.0–100.0)
Monocytes Absolute: 0.6 10*3/uL (ref 0.1–1.0)
Monocytes Relative: 17 %
Neutro Abs: 2.5 10*3/uL (ref 1.7–7.7)
Neutrophils Relative %: 71 %
Platelet Count: 191 10*3/uL (ref 150–400)
RBC: 2.26 MIL/uL — ABNORMAL LOW (ref 3.87–5.11)
RDW: 18.1 % — ABNORMAL HIGH (ref 11.5–15.5)
WBC Count: 3.6 10*3/uL — ABNORMAL LOW (ref 4.0–10.5)
nRBC: 1.4 % — ABNORMAL HIGH (ref 0.0–0.2)

## 2021-01-22 LAB — PREPARE RBC (CROSSMATCH)

## 2021-01-22 MED ORDER — SODIUM CHLORIDE 0.9% IV SOLUTION
250.0000 mL | Freq: Once | INTRAVENOUS | Status: AC
Start: 2021-01-22 — End: 2021-01-22
  Administered 2021-01-22: 250 mL via INTRAVENOUS
  Filled 2021-01-22: qty 250

## 2021-01-22 MED ORDER — PALONOSETRON HCL INJECTION 0.25 MG/5ML
INTRAVENOUS | Status: AC
  Filled 2021-01-22: qty 5

## 2021-01-22 MED ORDER — DIPHENHYDRAMINE HCL 25 MG PO CAPS
ORAL_CAPSULE | ORAL | Status: AC
  Filled 2021-01-22: qty 1

## 2021-01-22 MED ORDER — ACETAMINOPHEN 325 MG PO TABS
ORAL_TABLET | ORAL | Status: AC
  Filled 2021-01-22: qty 2

## 2021-01-22 MED ORDER — FUROSEMIDE 10 MG/ML IJ SOLN
20.0000 mg | Freq: Once | INTRAMUSCULAR | Status: AC
  Administered 2021-01-22: 20 mg via INTRAVENOUS

## 2021-01-22 MED ORDER — MAGNESIUM SULFATE 2 GM/50ML IV SOLN
INTRAVENOUS | Status: AC
  Filled 2021-01-22: qty 50

## 2021-01-22 MED ORDER — FUROSEMIDE 10 MG/ML IJ SOLN
INTRAMUSCULAR | Status: AC
  Filled 2021-01-22: qty 2

## 2021-01-22 MED ORDER — LUSPATERCEPT-AAMT 75 MG ~~LOC~~ SOLR
1.7500 mg/kg | Freq: Once | SUBCUTANEOUS | Status: AC
Start: 2021-01-22 — End: 2021-01-22
  Administered 2021-01-22: 150 mg via SUBCUTANEOUS
  Filled 2021-01-22: qty 1.5

## 2021-01-22 MED ORDER — ACETAMINOPHEN 325 MG PO TABS
650.0000 mg | ORAL_TABLET | Freq: Once | ORAL | Status: AC
  Administered 2021-01-22: 650 mg via ORAL

## 2021-01-22 MED ORDER — DIPHENHYDRAMINE HCL 25 MG PO CAPS
25.0000 mg | ORAL_CAPSULE | Freq: Once | ORAL | Status: AC
  Administered 2021-01-22: 25 mg via ORAL

## 2021-01-22 NOTE — Progress Notes (Signed)
Hematology and Oncology Follow Up Visit  Patricia Mata [id number] 01-01-1956 65 y.o. 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  She was found to have IPSS-R score of 2.5.  Secondary diagnosis: Stage III colon cancer diagnosed in 2008.  No additional treatment needed and continues to be disease-free.    Prior Therapy: She is S/P hemicolectomy done in 11/2006. 1/13 lymph nodes are positive.  This was followed by 12 cycles of FOLFOX completed in 05/2007  Aranesp 300 mcg every 2 to 3 weeks therapy was ineffective to eliminate transfusion needs.  5-azacytidine at 75 mg/m square subcutaneously started in October 2018.  She is status post 7 cycles of therapy completed in April 2019.   Current therapy:   Reblozyl 75 mg subcutaneous injection every 3 weeks started in December 2021.    Back red cell transfusion to keep hemoglobin above 8.   Interim History:  Patricia Mata returns today for a follow-up visit.  Since last visit, she reports feeling well overall without any major complaints but she has been requiring packed red cell transfusion almost on a weekly basis at this time.  She denies any hematochezia or melena but does report discoloration of her stool at times alternating between brown and occasional dark stools.  She denies any chest pain or palpitation but does report dyspnea on exertion at times.    Medication: Updated on review. Current Outpatient Medications on File Prior to Visit  Medication Sig Dispense Refill   allopurinol (ZYLOPRIM) 100 MG tablet Take 100 mg by mouth daily.     amiodarone (PACERONE) 200 MG tablet Take 200 mg by mouth daily.     amLODipine (NORVASC) 10 MG tablet Take 1 tablet (10 mg total) by mouth daily. 90 tablet 3   atorvastatin (LIPITOR) 40 MG tablet TAKE 1 TABLET BY MOUTH EVERY DAY 90 tablet 0   blood glucose meter kit and supplies Dispense based on patient and insurance preference. Use up  to four times daily as directed. (FOR ICD-10 E10.9, E11.9). 1 each 3   colchicine 0.6 MG tablet Take 0.6 mg by mouth 2 (two) times daily as needed (for gout).     doxazosin (CARDURA) 1 MG tablet Take 1 tablet (1 mg total) by mouth at bedtime. 30 tablet 6   doxycycline (ADOXA) 100 MG tablet Take 1 tablet (100 mg total) by mouth 2 (two) times daily for 7 days. 14 tablet 0   furosemide (LASIX) 40 MG tablet TAKE 1 TABLET BY MOUTH EVERY DAY 90 tablet 1   gabapentin (NEURONTIN) 300 MG capsule      glucose blood test strip Use as instructed 100 each 12   hydrALAZINE (APRESOLINE) 100 MG tablet Take 1 tablet (100 mg total) by mouth 3 (three) times daily. 180 tablet 5   insulin glargine (LANTUS) 100 unit/mL SOPN Inject 0.15 mLs (15 Units total) into the skin daily. (Patient taking differently: Inject 15 Units into the skin daily as needed (blood sugar over 150). )     Insulin Pen Needle 29G X 5MM MISC Use as directed 200 each 0   latanoprost (XALATAN) 0.005 % ophthalmic solution Place 1 drop into both eyes at bedtime.      levothyroxine (SYNTHROID) 88 MCG tablet Take 1 tablet (88 mcg total) by mouth daily. 30 tablet 2   metoprolol succinate (TOPROL-XL) 25 MG 24 hr tablet Take 1 tablet (25 mg total) by mouth daily. STOP taking this unless heart rate >  100bpm OR directed by your doctor 30 tablet 5   Multiple Vitamins-Minerals (CENTRUM SILVER 50+WOMEN) TABS Take 1 tablet by mouth daily.     sildenafil (REVATIO) 20 MG tablet Take 1 tablet (20 mg total) by mouth 3 (three) times daily. 270 tablet 3   sitaGLIPtin (JANUVIA) 50 MG tablet Take 1 tablet (50 mg total) by mouth daily. 90 tablet 0               Allergies:  Allergies  Allergen Reactions   Ancef [Cefazolin] Itching    Severe itching- after procedure, ancef was the antibiotic.-04/02/17 Tolerates penicillins   Lactose Intolerance (Gi) Diarrhea   Morphine And Related Itching   Sulfa Antibiotics Rash and Other (See Comments)    Blisters, also      Physical Exam:       Blood pressure (!) 125/39, pulse 79, temperature 98.2 F (36.8 C), temperature source Tympanic, resp. rate 19, height 5' 4" (1.626 m), weight 200 lb 1.6 oz (90.8 kg), SpO2 100 %.     ECOG: 2     General appearance: Alert, awake without any distress. Head: Atraumatic without abnormalities Oropharynx: Without any thrush or ulcers. Eyes: No scleral icterus. Lymph nodes: No lymphadenopathy noted in the cervical, supraclavicular, or axillary nodes Heart:regular rate and rhythm, without any murmurs or gallops.   Lung: Clear to auscultation without any rhonchi, wheezes or dullness to percussion. Abdomin: Soft, nontender without any shifting dullness or ascites. Musculoskeletal: No clubbing or cyanosis. Neurological: No motor or sensory deficits. Skin: No rashes or lesions.                   Lab Results: Lab Results  Component Value Date   WBC 6.0 01/14/2021   HGB 8.3 (L) 01/14/2021   HCT 24.0 (L) 01/14/2021   MCV 84.8 01/14/2021   PLT 220 01/14/2021     Chemistry      Component Value Date/Time   NA 133 (L) 01/14/2021 0443   NA 139 09/05/2020 1253   NA 134 (L) 05/26/2017 0949   K 3.8 01/14/2021 0443   K 4.0 05/26/2017 0949   CL 96 (L) 01/14/2021 0443   CL 96 (L) 03/31/2012 1513   CO2 28 01/14/2021 0443   CO2 24 05/26/2017 0949   BUN 27 (H) 01/14/2021 0443   BUN 89 (HH) 09/05/2020 1253   BUN 29.2 (H) 05/26/2017 0949   CREATININE 2.21 (H) 01/14/2021 0443   CREATININE 4.79 (HH) 09/28/2020 1351   CREATININE 2.24 (H) 01/27/2019 1355   CREATININE 1.4 (H) 05/26/2017 0949      Component Value Date/Time   CALCIUM 9.3 01/14/2021 0443   CALCIUM 8.9 05/26/2017 0949   ALKPHOS 160 (H) 11/21/2020 1102   ALKPHOS 157 (H) 05/26/2017 0949   AST 59 (H) 11/21/2020 1102   AST 104 (H) 09/28/2020 1351   AST 29 05/26/2017 0949   ALT 35 11/21/2020 1102   ALT 82 (H) 09/28/2020 1351   ALT 23 05/26/2017 0949   BILITOT 1.1 11/21/2020 1102    BILITOT 0.9 09/28/2020 1351   BILITOT 2.19 (H) 05/26/2017 0949      Impression and Plan:  65 year old woman with:   1.  Refractory MDS diagnosed in 2017 after presenting with anemia.  She continues to be on Reblozyl every 3 weeks without any major complications.  Risks and benefits of continuing this treatment were discussed.  Thromboembolism, hypertension among other complications were reviewed.  Alternative treatment options such as chemotherapy were deferred at  this time.  He is she is agreeable to continue at this time.   2.  Anemia: Currently transfusion dependent with multifactorial component to it.  She has anemia of renal failure which is contributing to her transfusion need.  Her hemoglobin is down and she is symptomatic, she will receive 2 units of packed red cells.  3. Hypertension: Mildly elevated at this time but overall controlled.   4.  Aortic stenosis: No evidence of heart failure at this time.    5.  Prognosis: Any treatment is palliative at this time and overall prognosis poor given her multiorgan dysfunction.  6.  Renal failure: She is currently dialysis dependent which could be contributing to her anemia.  7.  Follow-up: Weekly for transfusion and MD follow-up in 9 weeks.  30  minutes were dedicated to this visit.  The time was spent on reviewing laboratory data, disease status update, treatment choices and future plan of care    Zola Button, MD 7/11/20228:18 AM

## 2021-01-22 NOTE — Progress Notes (Signed)
CRITICAL VALUE STICKER  CRITICAL VALUE: Hbg 6.8  RECEIVER (on-site recipient of call): L. Cagwin RN  DATE & TIME NOTIFIED: 01/22/21 @ 0827  MESSENGER (representative from lab):  MD NOTIFIED: Shadad  TIME OF NOTIFICATION: 0828  RESPONSE:  Patient to receive two units PRBC's

## 2021-01-22 NOTE — Patient Instructions (Signed)
Blood Transfusion, Adult, Care After This sheet gives you information about how to care for yourself after your procedure. Your doctor may also give you more specific instructions. If youhave problems or questions, contact your doctor. What can I expect after the procedure? After the procedure, it is common to have: Bruising and soreness at the IV site. A fever or chills on the day of the procedure. This may be your body's response to the new blood cells received. A headache. Follow these instructions at home: Insertion site care     Follow instructions from your doctor about how to take care of your insertion site. This is where an IV tube was put into your vein. Make sure you: Wash your hands with soap and water before and after you change your bandage (dressing). If you cannot use soap and water, use hand sanitizer. Change your bandage as told by your doctor. Check your insertion site every day for signs of infection. Check for: Redness, swelling, or pain. Bleeding from the site. Warmth. Pus or a bad smell. General instructions Take over-the-counter and prescription medicines only as told by your doctor. Rest as told by your doctor. Go back to your normal activities as told by your doctor. Keep all follow-up visits as told by your doctor. This is important. Contact a doctor if: You have itching or red, swollen areas of skin (hives). You feel worried or nervous (anxious). You feel weak after doing your normal activities. You have redness, swelling, warmth, or pain around the insertion site. You have blood coming from the insertion site, and the blood does not stop with pressure. You have pus or a bad smell coming from the insertion site. Get help right away if: You have signs of a serious reaction. This may be coming from an allergy or the body's defense system (immune system). Signs include: Trouble breathing or shortness of breath. Swelling of the face or feeling warm  (flushed). Fever or chills. Head, chest, or back pain. Dark pee (urine) or blood in the pee. Widespread rash. Fast heartbeat. Feeling dizzy or light-headed. You may receive your blood transfusion in an outpatient setting. If so, youwill be told whom to contact to report any reactions. These symptoms may be an emergency. Do not wait to see if the symptoms will go away. Get medical help right away. Call your local emergency services (911 in the [place] Do not drive yourself to the hospital. Summary Bruising and soreness at the IV site are common. Check your insertion site every day for signs of infection. Rest as told by your doctor. Go back to your normal activities as told by your doctor. Get help right away if you have signs of a serious reaction. This information is not intended to replace advice given to you by your health care provider. Make sure you discuss any questions you have with your healthcare provider. Document Revised: 12/24/2018 Document Reviewed: 12/24/2018 Elsevier Patient Education  2022 [employer] Inc[employer]  

## 2021-01-22 NOTE — Progress Notes (Signed)
Patient stated that she felt fine to be discharged following blood transfusions. Patient advised to check blood pressure at home and to call if she began to feel light-headed or dizzy. Patient verbalized an understanding of the education.

## 2021-01-23 LAB — TYPE AND SCREEN
ABO/RH(D): A NEG
Antibody Screen: NEGATIVE
Unit division: 0
Unit division: 0

## 2021-01-23 LAB — BPAM RBC
Blood Product Expiration Date: 202207302359
Blood Product Expiration Date: 202207302359
ISSUE DATE / TIME: 202207111005
ISSUE DATE / TIME: 202207111005
Unit Type and Rh: 600
Unit Type and Rh: 600

## 2021-01-29 ENCOUNTER — Inpatient Hospital Stay: Payer: Medicare Other

## 2021-01-29 ENCOUNTER — Other Ambulatory Visit: Payer: Self-pay

## 2021-01-29 ENCOUNTER — Other Ambulatory Visit: Payer: Self-pay | Admitting: *Deleted

## 2021-01-29 DIAGNOSIS — D464 Refractory anemia, unspecified: Secondary | ICD-10-CM | POA: Diagnosis not present

## 2021-01-29 DIAGNOSIS — D469 Myelodysplastic syndrome, unspecified: Secondary | ICD-10-CM | POA: Diagnosis not present

## 2021-01-29 DIAGNOSIS — D649 Anemia, unspecified: Secondary | ICD-10-CM

## 2021-01-29 LAB — CBC WITH DIFFERENTIAL (CANCER CENTER ONLY)
Abs Immature Granulocytes: 0.03 10*3/uL (ref 0.00–0.07)
Basophils Absolute: 0 10*3/uL (ref 0.0–0.1)
Basophils Relative: 0 %
Eosinophils Absolute: 0 10*3/uL (ref 0.0–0.5)
Eosinophils Relative: 1 %
HCT: 21.1 % — ABNORMAL LOW (ref 36.0–46.0)
Hemoglobin: 7.2 g/dL — ABNORMAL LOW (ref 12.0–15.0)
Immature Granulocytes: 1 %
Lymphocytes Relative: 13 %
Lymphs Abs: 0.7 10*3/uL (ref 0.7–4.0)
MCH: 30.1 pg (ref 26.0–34.0)
MCHC: 34.1 g/dL (ref 30.0–36.0)
MCV: 88.3 fL (ref 80.0–100.0)
Monocytes Absolute: 1 10*3/uL (ref 0.1–1.0)
Monocytes Relative: 18 %
Neutro Abs: 3.7 10*3/uL (ref 1.7–7.7)
Neutrophils Relative %: 67 %
Platelet Count: 211 10*3/uL (ref 150–400)
RBC: 2.39 MIL/uL — ABNORMAL LOW (ref 3.87–5.11)
RDW: 17.3 % — ABNORMAL HIGH (ref 11.5–15.5)
WBC Count: 5.4 10*3/uL (ref 4.0–10.5)
nRBC: 0.6 % — ABNORMAL HIGH (ref 0.0–0.2)

## 2021-01-29 LAB — SAMPLE TO BLOOD BANK

## 2021-01-29 LAB — PREPARE RBC (CROSSMATCH)

## 2021-01-29 MED ORDER — ACETAMINOPHEN 325 MG PO TABS
ORAL_TABLET | ORAL | Status: AC
  Filled 2021-01-29: qty 2

## 2021-01-29 MED ORDER — ACETAMINOPHEN 325 MG PO TABS
650.0000 mg | ORAL_TABLET | Freq: Once | ORAL | Status: AC
  Administered 2021-01-29: 650 mg via ORAL

## 2021-01-29 MED ORDER — FUROSEMIDE 10 MG/ML IJ SOLN
20.0000 mg | Freq: Once | INTRAMUSCULAR | Status: DC
Start: 2021-01-29 — End: 2021-01-29

## 2021-01-29 MED ORDER — DIPHENHYDRAMINE HCL 25 MG PO CAPS
25.0000 mg | ORAL_CAPSULE | Freq: Once | ORAL | Status: AC
  Administered 2021-01-29: 25 mg via ORAL

## 2021-01-29 MED ORDER — SODIUM CHLORIDE 0.9% IV SOLUTION
250.0000 mL | Freq: Once | INTRAVENOUS | Status: AC
  Administered 2021-01-29: 250 mL via INTRAVENOUS
  Filled 2021-01-29: qty 250

## 2021-01-29 MED ORDER — DIPHENHYDRAMINE HCL 25 MG PO CAPS
ORAL_CAPSULE | ORAL | Status: AC
  Filled 2021-01-29: qty 1

## 2021-01-29 NOTE — Patient Instructions (Signed)
Blood Transfusion, Adult, Care After This sheet gives you information about how to care for yourself after your procedure. Your doctor may also give you more specific instructions. If youhave problems or questions, contact your doctor. What can I expect after the procedure? After the procedure, it is common to have: Bruising and soreness at the IV site. A fever or chills on the day of the procedure. This may be your body's response to the new blood cells received. A headache. Follow these instructions at home: Insertion site care     Follow instructions from your doctor about how to take care of your insertion site. This is where an IV tube was put into your vein. Make sure you: Wash your hands with soap and water before and after you change your bandage (dressing). If you cannot use soap and water, use hand sanitizer. Change your bandage as told by your doctor. Check your insertion site every day for signs of infection. Check for: Redness, swelling, or pain. Bleeding from the site. Warmth. Pus or a bad smell. General instructions Take over-the-counter and prescription medicines only as told by your doctor. Rest as told by your doctor. Go back to your normal activities as told by your doctor. Keep all follow-up visits as told by your doctor. This is important. Contact a doctor if: You have itching or red, swollen areas of skin (hives). You feel worried or nervous (anxious). You feel weak after doing your normal activities. You have redness, swelling, warmth, or pain around the insertion site. You have blood coming from the insertion site, and the blood does not stop with pressure. You have pus or a bad smell coming from the insertion site. Get help right away if: You have signs of a serious reaction. This may be coming from an allergy or the body's defense system (immune system). Signs include: Trouble breathing or shortness of breath. Swelling of the face or feeling warm  (flushed). Fever or chills. Head, chest, or back pain. Dark pee (urine) or blood in the pee. Widespread rash. Fast heartbeat. Feeling dizzy or light-headed. You may receive your blood transfusion in an outpatient setting. If so, youwill be told whom to contact to report any reactions. These symptoms may be an emergency. Do not wait to see if the symptoms will go away. Get medical help right away. Call your local emergency services (911 in the [place] Do not drive yourself to the hospital. Summary Bruising and soreness at the IV site are common. Check your insertion site every day for signs of infection. Rest as told by your doctor. Go back to your normal activities as told by your doctor. Get help right away if you have signs of a serious reaction. This information is not intended to replace advice given to you by your health care provider. Make sure you discuss any questions you have with your healthcare provider. Document Revised: 12/24/2018 Document Reviewed: 12/24/2018 Elsevier Patient Education  2022 [employer] Inc[employer]  

## 2021-01-30 LAB — TYPE AND SCREEN
ABO/RH(D): A NEG
Antibody Screen: NEGATIVE
Unit division: 0
Unit division: 0

## 2021-01-30 LAB — BPAM RBC
Blood Product Expiration Date: 202208042359
Blood Product Expiration Date: 202208062359
ISSUE DATE / TIME: 202207181411
ISSUE DATE / TIME: 202207181411
Unit Type and Rh: 600
Unit Type and Rh: 600

## 2021-01-31 ENCOUNTER — Encounter: Payer: Self-pay | Admitting: Nurse Practitioner

## 2021-01-31 ENCOUNTER — Other Ambulatory Visit: Payer: BC Managed Care – PPO

## 2021-01-31 ENCOUNTER — Other Ambulatory Visit: Payer: Self-pay

## 2021-01-31 ENCOUNTER — Ambulatory Visit (INDEPENDENT_AMBULATORY_CARE_PROVIDER_SITE_OTHER): Payer: Medicare Other | Admitting: Nurse Practitioner

## 2021-01-31 VITALS — BP 130/60 | HR 72 | Temp 98.5°F | Ht 66.2 in | Wt 196.6 lb

## 2021-01-31 DIAGNOSIS — D649 Anemia, unspecified: Secondary | ICD-10-CM

## 2021-01-31 DIAGNOSIS — D631 Anemia in chronic kidney disease: Secondary | ICD-10-CM | POA: Diagnosis not present

## 2021-01-31 DIAGNOSIS — N186 End stage renal disease: Secondary | ICD-10-CM | POA: Diagnosis not present

## 2021-01-31 DIAGNOSIS — E6609 Other obesity due to excess calories: Secondary | ICD-10-CM | POA: Diagnosis not present

## 2021-01-31 DIAGNOSIS — Z992 Dependence on renal dialysis: Secondary | ICD-10-CM

## 2021-01-31 DIAGNOSIS — Z6831 Body mass index (BMI) 31.0-31.9, adult: Secondary | ICD-10-CM

## 2021-01-31 DIAGNOSIS — Z23 Encounter for immunization: Secondary | ICD-10-CM

## 2021-01-31 DIAGNOSIS — E039 Hypothyroidism, unspecified: Secondary | ICD-10-CM

## 2021-01-31 MED ORDER — SHINGRIX 50 MCG/0.5ML IM SUSR: 0.5000 mL | Freq: Once | INTRAMUSCULAR | 0 refills | Status: AC

## 2021-01-31 NOTE — Progress Notes (Addendum)
I,Patricia Mata,acting as a Education administrator for [employer], FNP.,have documented all relevant documentation on the behalf of Patricia Brine, FNP,as directed by  Patricia Brine, FNP while in the presence of Patricia Mata, [place].  This visit occurred during the SARS-CoV-2 public health emergency.  Safety protocols were in place, including screening questions prior to the visit, additional usage of staff PPE, and extensive cleaning of exam room while observing appropriate contact time as indicated for disinfecting solutions.  Subjective:     Patient ID: Patricia Mata , female    DOB: 03-15-1956 , 65 y.o.   MRN: [id number]   Chief Complaint  Patient presents with   Hospitalization Follow-up    HPI  Patient is here for hospital follow up. She was admitted for a blood transfusion. She is having her blood checked weekly with Dr. Alen Blew. She was admitted due to not wanting to wait in ER by Dr Alen Blew. She had 2 units this past Monday and 2 units PRBC the Monday.  She feels good today when she has had her blood transfusions. She will feel sluggish until she has the transfusions. She is to have CBC done weekly - will go to Cancer center for her infusion.    She is to go to [facility] due to having the dialysis center.  Her sister is dying from Leukemia and difficulty with breathing.     Past Medical History:  Diagnosis Date   Acute renal failure (ARF) ([place]) 06/08/2018   TTHS- [place]   Carotid artery disease [facility])    Chronic diastolic (congestive) heart failure (HCC)    Colon cancer ([place]) 2008   Diabetes mellitus ([place])    Dyspnea    Gout 09/08/2018   Heart attack ([place])    Heart murmur    Hypertension    Hypothyroidism    Macrocytic anemia    MDS (myelodysplastic syndrome) (HCC)    Moderate mitral stenosis    PAD (peripheral artery disease) (HCC)    PAF (paroxysmal atrial fibrillation) (HCC)    Pulmonary hypertension (HCC)    Renal artery stenosis (HCC)    a. >60% R renal artery stenosis  by duplex 2021, managed medically.   Severe aortic stenosis    Stage 4 chronic kidney disease (HCC)    Transfusion-dependent anemia      Family History  Problem Relation Age of Onset   Hypertension Mother    Diabetes Mother    Cervical cancer Mother    Heart attack Father    Hypertension Sister    Hypertension Brother    Hypertension Sister    Hypertension Sister    Prostate cancer Brother    HIV/AIDS Brother      Current Outpatient Medications:    Zoster Vaccine Adjuvanted [facility]) injection, Inject 0.5 mLs into the muscle once for 1 dose., Disp: 0.5 mL, Rfl: 0   allopurinol (ZYLOPRIM) 300 MG tablet, Take 300 mg by mouth daily., Disp: , Rfl:    amiodarone (PACERONE) 200 MG tablet, Take 1 tablet (200 mg total) by mouth daily., Disp: 90 tablet, Rfl: 3   atorvastatin (LIPITOR) 40 MG tablet, Take 1 tablet by mouth once daily, Disp: 90 tablet, Rfl: 1   blood glucose meter kit and supplies, Dispense based on patient and insurance preference. Use up to four times daily as directed. (FOR ICD-10 E10.9, E11.9)., Disp: 1 each, Rfl: 3   Blood Glucose Monitoring Suppl (ONETOUCH ULTRALINK) w/Device KIT, Check blood sugars twice daily E11.9, Disp: 1 kit, Rfl: 0  carvedilol (COREG) 3.125 MG tablet, Take 1 tablet (3.125 mg total) by mouth 2 (two) times daily., Disp: 60 tablet, Rfl: 3   cholecalciferol (VITAMIN D3) 25 MCG (1000 UNIT) tablet, Take 1,000 Units by mouth daily., Disp: , Rfl:    cloNIDine (CATAPRES) 0.1 MG tablet, Take 1 tablet (0.1 mg total) by mouth 2 (two) times daily., Disp: 60 tablet, Rfl: 11   colchicine 0.6 MG tablet, Take 0.6 mg by mouth daily as needed (gout)., Disp: , Rfl:    EUTHYROX 150 MCG tablet, TAKE 1 TABLET BY MOUTH ONCE DAILY BEFORE BREAKFAST, Disp: 30 tablet, Rfl: 0   glucose blood test strip, check blood sugars twice daily Dx code E11.22, Disp: 100 each, Rfl: 3   hydrALAZINE (APRESOLINE) 100 MG tablet, Take 1 tablet (100 mg total) by mouth 3 (three) times daily.,  Disp: 90 tablet, Rfl: 11   Insulin Glargine (BASAGLAR KWIKPEN) 100 UNIT/ML, Inject 15 Units into the skin daily. Only if above 150, Disp: , Rfl:    Insulin Pen Needle 29G X 5MM MISC, Use as directed, Disp: 200 each, Rfl: 0   Lancets (ONETOUCH DELICA PLUS ZRAQTM22Q) MISC, check blood sugars twice daily Dx code: E11.22, Disp: 100 each, Rfl: 3   latanoprost (XALATAN) 0.005 % ophthalmic solution, Place 1 drop into both eyes at bedtime. , Disp: , Rfl:    linagliptin (TRADJENTA) 5 MG TABS tablet, Take 1 tablet (5 mg total) by mouth daily., Disp: 90 tablet, Rfl: 1   Multiple Vitamins-Minerals (CENTRUM SILVER 50+WOMEN) TABS, Take 1 tablet by mouth daily., Disp: , Rfl:    polyethylene glycol (MIRALAX / GLYCOLAX) 17 g packet, Take 17 g by mouth daily as needed for moderate constipation., Disp: , Rfl:    sildenafil (REVATIO) 20 MG tablet, Take 1 tablet (20 mg total) by mouth 3 (three) times daily., Disp: 90 tablet, Rfl: 3   Allergies  Allergen Reactions   Ancef [Cefazolin] Itching    Severe itching- after procedure, ancef was the antibiotic.-04/02/17 Tolerates penicillins   Lactose Intolerance (Gi) Diarrhea   Morphine And Related Itching   Sulfa Antibiotics Rash and Other (See Comments)    Blisters, also     Review of Systems  Constitutional: Negative.   Respiratory: Negative.    Cardiovascular: Negative.   Gastrointestinal: Negative.   Neurological: Negative.     Today's Vitals   01/31/21 1429  BP: 130/60  Pulse: 72  Temp: 98.5 F (36.9 C)  TempSrc: Oral  Weight: 196 lb 9.6 oz (89.2 kg)  Height: 5' 6.2" (1.681 m)   Body mass index is 31.54 kg/m.  Wt Readings from Last 3 Encounters:  01/31/21 196 lb 9.6 oz (89.2 kg)  01/22/21 200 lb 1.6 oz (90.8 kg)  01/13/21 197 lb (89.4 kg)    Objective:  Physical Exam Constitutional:      General: She is not in acute distress.    Appearance: Normal appearance. She is obese.  Cardiovascular:     Pulses: Normal pulses.     Heart sounds:  Normal heart sounds. No murmur heard. Pulmonary:     Effort: Pulmonary effort is normal. No respiratory distress.     Breath sounds: Normal breath sounds. No wheezing.  Neurological:     General: No focal deficit present.     Mental Status: She is alert and oriented to person, place, and time.     Cranial Nerves: No cranial nerve deficit.     Motor: No weakness.  Psychiatric:  Mood and Affect: Mood normal.        Behavior: Behavior normal.        Thought Content: Thought content normal.        Judgment: Judgment normal.        Assessment And Plan:     1. Symptomatic anemia TCM Performed. A member of the clinical team spoke with the patient upon dischare. Discharge summary was reviewed in full detail during the visit. Meds reconciled and compared to discharge meds. Medication list is updated and reviewed with the patient.  Greater than 50% face to face time was spent in counseling an coordination of care.  All questions were answered to the satisfaction of the patient.   She is doing better now that she has had a blood transfusion and Dr. Alen Blew will be checking Hgb weekly  Will refer to GI for further evaluation and possible colonoscopy - Ambulatory referral to Gastroenterology  2. Class 1 obesity due to excess calories with serious comorbidity and body mass index (BMI) of 31.0 to 31.9 in adult She is encouraged to strive for BMI less than 30 to decrease cardiac risk. Advised to aim for at least 150 minutes of exercise per week as tolerated  3. Encounter for immunization - Zoster Vaccine Adjuvanted [facility]) injection; Inject 0.5 mLs into the muscle once for 1 dose.  Dispense: 0.5 mL; Refill: 0  4. ESRD on dialysis [facility]) Continue with Hemodialysis three days a week   Patient was given opportunity to ask questions. Patient verbalized understanding of the plan and was able to repeat key elements of the plan. All questions were answered to their satisfaction.  Patricia Brine,  FNP   I, Patricia Brine, FNP, have reviewed all documentation for this visit. The documentation on 01/31/21 for the exam, diagnosis, procedures, and orders are all accurate and complete.   IF YOU HAVE BEEN REFERRED TO A SPECIALIST, IT MAY TAKE 1-2 WEEKS TO SCHEDULE/PROCESS THE REFERRAL. IF YOU HAVE NOT HEARD FROM US/SPECIALIST IN TWO WEEKS, PLEASE GIVE [place] A CALL AT [phone].   THE PATIENT IS ENCOURAGED TO PRACTICE SOCIAL DISTANCING DUE TO THE COVID-19 PANDEMIC.

## 2021-01-31 NOTE — Patient Instructions (Signed)
[employer] medicine (25th ed., pp. 1059-1068). [place], [place]: Elsevier.">  Anemia  Anemia is a condition in which there is not enough red blood cells or hemoglobin in the blood. Hemoglobin is a substance in red blood cells thatcarries oxygen. When you do not have enough red blood cells or hemoglobin (are anemic), your body cannot get enough oxygen and your organs may not work properly. Asa result, you may feel very tired or have other problems. What are the causes? Common causes of anemia include: Excessive bleeding. Anemia can be caused by excessive bleeding inside or outside the body, including bleeding from the intestines or from heavy menstrual periods in females. Poor nutrition. Long-lasting (chronic) kidney, thyroid, and liver disease. Bone marrow disorders, spleen problems, and blood disorders. Cancer and treatments for cancer. HIV (human immunodeficiency virus) and AIDS (acquired immunodeficiency syndrome). Infections, medicines, and autoimmune disorders that destroy red blood cells. What are the signs or symptoms? Symptoms of this condition include: Minor weakness. Dizziness. Headache, or difficulties concentrating and sleeping. Heartbeats that feel irregular or faster than normal (palpitations). Shortness of breath, especially with exercise. Pale skin, lips, and nails, or cold hands and feet. Indigestion and nausea. Symptoms may occur suddenly or develop slowly. If your anemia is mild, you maynot have symptoms. How is this diagnosed? This condition is diagnosed based on blood tests, your medical history, and a physical exam. In some cases, a test may be needed in which cells are removed from the soft tissue inside of a bone and looked at under a microscope (bone marrow biopsy). Your health care provider may also check your stool (feces) for blood and may do additional testing to look for the cause of yourbleeding. Other tests may include: Imaging tests, such as a CT scan or  MRI. A procedure to see inside your esophagus and stomach (endoscopy). A procedure to see inside your colon and rectum (colonoscopy). How is this treated? Treatment for this condition depends on the cause. If you continue to lose a lot of blood, you may need to be treated at a hospital. Treatment may include: Taking supplements of iron, vitamin B12, or folic acid. Taking a hormone medicine (erythropoietin) that can help to stimulate red blood cell growth. Having a blood transfusion. This may be needed if you lose a lot of blood. Making changes to your diet. Having surgery to remove your spleen. Follow these instructions at home: Take over-the-counter and prescription medicines only as told by your health care provider. Take supplements only as told by your health care provider. Follow any diet instructions that you were given by your health care provider. Keep all follow-up visits as told by your health care provider. This is important. Contact a health care provider if: You develop new bleeding anywhere in the body. Get help right away if: You are very weak. You are short of breath. You have pain in your abdomen or chest. You are dizzy or feel faint. You have trouble concentrating. You have bloody stools, black stools, or tarry stools. You vomit repeatedly or you vomit up blood. These symptoms may represent a serious problem that is an emergency. Do not wait to see if the symptoms will go away. Get medical help right away. Call your local emergency services (911 in the [place] Do not drive yourself to the hospital. Summary Anemia is a condition in which you do not have enough red blood cells or enough of a substance in your red blood cells that carries oxygen (hemoglobin). Symptoms may occur suddenly   or develop slowly. If your anemia is mild, you may not have symptoms. This condition is diagnosed with blood tests, a medical history, and a physical exam. Other tests may be  needed. Treatment for this condition depends on the cause of the anemia. This information is not intended to replace advice given to you by your health care provider. Make sure you discuss any questions you have with your healthcare provider. Document Revised: 06/08/2019 Document Reviewed: 06/08/2019 Elsevier Patient Education  2022 [employer].

## 2021-02-01 LAB — T4: T4, Total: 11.3 ug/dL (ref 4.5–12.0)

## 2021-02-01 LAB — TSH: TSH: 1.98 u[IU]/mL (ref 0.450–4.500)

## 2021-02-02 ENCOUNTER — Other Ambulatory Visit (HOSPITAL_COMMUNITY): Payer: Self-pay | Admitting: Internal Medicine

## 2021-02-05 ENCOUNTER — Other Ambulatory Visit: Payer: Self-pay | Admitting: Oncology

## 2021-02-05 ENCOUNTER — Other Ambulatory Visit: Payer: Self-pay | Admitting: *Deleted

## 2021-02-05 ENCOUNTER — Inpatient Hospital Stay: Payer: Medicare Other

## 2021-02-05 ENCOUNTER — Other Ambulatory Visit: Payer: Self-pay

## 2021-02-05 DIAGNOSIS — D469 Myelodysplastic syndrome, unspecified: Secondary | ICD-10-CM | POA: Diagnosis not present

## 2021-02-05 DIAGNOSIS — D649 Anemia, unspecified: Secondary | ICD-10-CM

## 2021-02-05 DIAGNOSIS — D464 Refractory anemia, unspecified: Secondary | ICD-10-CM | POA: Diagnosis not present

## 2021-02-05 LAB — CBC WITH DIFFERENTIAL (CANCER CENTER ONLY)
Abs Immature Granulocytes: 0.04 10*3/uL (ref 0.00–0.07)
Basophils Absolute: 0 10*3/uL (ref 0.0–0.1)
Basophils Relative: 0 %
Eosinophils Absolute: 0.1 10*3/uL (ref 0.0–0.5)
Eosinophils Relative: 1 %
HCT: 22.5 % — ABNORMAL LOW (ref 36.0–46.0)
Hemoglobin: 7.7 g/dL — ABNORMAL LOW (ref 12.0–15.0)
Immature Granulocytes: 1 %
Lymphocytes Relative: 11 %
Lymphs Abs: 0.5 10*3/uL — ABNORMAL LOW (ref 0.7–4.0)
MCH: 30.4 pg (ref 26.0–34.0)
MCHC: 34.2 g/dL (ref 30.0–36.0)
MCV: 88.9 fL (ref 80.0–100.0)
Monocytes Absolute: 0.7 10*3/uL (ref 0.1–1.0)
Monocytes Relative: 15 %
Neutro Abs: 3.4 10*3/uL (ref 1.7–7.7)
Neutrophils Relative %: 72 %
Platelet Count: 193 10*3/uL (ref 150–400)
RBC: 2.53 MIL/uL — ABNORMAL LOW (ref 3.87–5.11)
RDW: 16.8 % — ABNORMAL HIGH (ref 11.5–15.5)
WBC Count: 4.7 10*3/uL (ref 4.0–10.5)
nRBC: 0.6 % — ABNORMAL HIGH (ref 0.0–0.2)

## 2021-02-05 LAB — SAMPLE TO BLOOD BANK

## 2021-02-05 LAB — PREPARE RBC (CROSSMATCH)

## 2021-02-05 MED ORDER — SODIUM CHLORIDE 0.9% IV SOLUTION
250.0000 mL | Freq: Once | INTRAVENOUS | Status: AC
  Administered 2021-02-05: 250 mL via INTRAVENOUS
  Filled 2021-02-05: qty 250

## 2021-02-05 MED ORDER — ACETAMINOPHEN 325 MG PO TABS
ORAL_TABLET | ORAL | Status: AC
  Filled 2021-02-05: qty 2

## 2021-02-05 MED ORDER — DIPHENHYDRAMINE HCL 25 MG PO CAPS
25.0000 mg | ORAL_CAPSULE | Freq: Once | ORAL | Status: AC
  Administered 2021-02-05: 25 mg via ORAL

## 2021-02-05 MED ORDER — ACETAMINOPHEN 325 MG PO TABS
650.0000 mg | ORAL_TABLET | Freq: Once | ORAL | Status: AC
  Administered 2021-02-05: 650 mg via ORAL

## 2021-02-05 MED ORDER — DIPHENHYDRAMINE HCL 25 MG PO CAPS
ORAL_CAPSULE | ORAL | Status: AC
  Filled 2021-02-05: qty 1

## 2021-02-05 NOTE — Progress Notes (Signed)
Laboratory data reviewed from today.  Transfusion parameters will be as follows: She will receive 2 units of packed red cell transfusion with Tylenol and Benadryl for any hemoglobin less than 8.  She will receive 1 unit for hemoglobin between 8 and 9.  No transfusion for hemoglobin above 9.

## 2021-02-05 NOTE — Telephone Encounter (Signed)
Received a call from the patient requesting a refill on her medication.   Chart reviewed.  Rx(s) sent to pharmacy electronically.  Patient voiced understanding.

## 2021-02-05 NOTE — Patient Instructions (Signed)
Blood Transfusion, Adult °A blood transfusion is a procedure in which you receive blood through an IV tube. You may need this procedure because of: °A bleeding disorder. °An illness. °An injury. °A surgery. °The blood may come from someone else (a donor). You may also be able to donate blood for yourself. The blood given in a transfusion is made up of different types of cells. You may get: °Red blood cells. These carry oxygen to the cells in the body. °White blood cells. These help you fight infections. °Platelets. These help your blood to clot. °Plasma. This is the liquid part of your blood. It carries proteins and other substances through the body. °If you have a clotting disorder, you may also get other types of blood products. °Tell your doctor about: °Any blood disorders you have. °Any reactions you have had during a blood transfusion in the past. °Any allergies you have. °All medicines you are taking, including vitamins, herbs, eye drops, creams, and over-the-counter medicines. °Any surgeries you have had. °Any medical conditions you have. This includes any recent fever or cold symptoms. °Whether you are pregnant or may be pregnant. °What are the risks? °Generally, this is a safe procedure. However, problems may occur. °The most common problems include: °A mild allergic reaction. This includes red, swollen areas of skin (hives) and itching. °Fever or chills. This may be the body's response to new blood cells received. This may happen during or up to 4 hours after the transfusion. °More serious problems may include: °Too much fluid in the lungs. This may cause breathing problems. °A serious allergic reaction. This includes breathing trouble or swelling around the face and lips. °Lung injury. This causes breathing trouble and low oxygen in the blood. This can happen within hours of the transfusion or days later. °Too much iron. This can happen after getting many blood transfusions over a period of time. °An  infection or virus passed through the blood. This is rare. Donated blood is carefully tested before it is given. °Your body's defense system (immune system) trying to attack the new blood cells. This is rare. Symptoms may include fever, chills, nausea, low blood pressure, and low back or chest pain. °Donated cells attacking healthy tissues. This is rare. °What happens before the procedure? °Medicines °Ask your doctor about: °Changing or stopping your normal medicines. This is important. °Taking aspirin and ibuprofen. Do not take these medicines unless your doctor tells you to take them. °Taking over-the-counter medicines, vitamins, herbs, and supplements. °General instructions °Follow instructions from your doctor about what you cannot eat or drink. °You will have a blood test to find out your blood type. The test also finds out what type of blood your body will accept and matches it to the donor type. °If you are going to have a planned surgery, you may be able to donate your own blood. This may be done in case you need a transfusion. °You will have your temperature, blood pressure, and pulse checked. °You may receive medicine to help prevent an allergic reaction. This may be done if you have had a reaction to a transfusion before. This medicine may be given to you by mouth or through an IV tube. °This procedure lasts about 1-4 hours. Plan for the time you need. °What happens during the procedure? ° °An IV tube will be put into one of your veins. °The bag of donated blood will be attached to your IV tube. Then, the blood will enter through your vein. °Your temperature,   blood pressure, and pulse will be checked often. This is done to find early signs of a transfusion reaction. °Tell your nurse right away if you have any of these symptoms: °Shortness of breath or trouble breathing. °Chest or back pain. °Fever or chills. °Red, swollen areas of skin or itching. °If you have any signs or symptoms of a reaction, your  transfusion will be stopped. You may also be given medicine. °When the transfusion is finished, your IV tube will be taken out. °Pressure may be put on the IV site for a few minutes. °A bandage (dressing) will be put on the IV site. °The procedure may vary among doctors and hospitals. °What happens after the procedure? °You will be monitored until you leave the hospital or clinic. This includes checking your temperature, blood pressure, pulse, breathing rate, and blood oxygen level. °Your blood may be tested to see how you are responding to the transfusion. °You may be warmed with fluids or blankets. This is done to keep the temperature of your body normal. °If you have your procedure in an outpatient setting, you will be told whom to contact to report any reactions. °Where to find more information °To learn more, visit the [employer]: redcross.org °Summary °A blood transfusion is a procedure in which you are given blood through an IV tube. °The blood may come from someone else (a donor). You may also be able to donate blood for yourself. °The blood you are given is made up of different blood cells. You may receive red blood cells, platelets, plasma, or white blood cells. °Your temperature, blood pressure, and pulse will be checked often. °After the procedure, your blood may be tested to see how you are responding. °This information is not intended to replace advice given to you by your health care provider. Make sure you discuss any questions you have with your health care provider. °Document Revised: 12/24/2018 Document Reviewed: 12/24/2018 °Elsevier Patient Education © 2022 Elsevier Inc. ° °

## 2021-02-06 ENCOUNTER — Other Ambulatory Visit: Payer: Medicare Other

## 2021-02-06 LAB — TYPE AND SCREEN
ABO/RH(D): A NEG
Antibody Screen: NEGATIVE
Unit division: 0
Unit division: 0

## 2021-02-06 LAB — BPAM RBC
Blood Product Expiration Date: 202208192359
Blood Product Expiration Date: 202208192359
ISSUE DATE / TIME: 202207251346
ISSUE DATE / TIME: 202207251346
Unit Type and Rh: 600
Unit Type and Rh: 600

## 2021-02-11 ENCOUNTER — Other Ambulatory Visit: Payer: Self-pay | Admitting: Nurse Practitioner

## 2021-02-11 DIAGNOSIS — E039 Hypothyroidism, unspecified: Secondary | ICD-10-CM

## 2021-02-12 ENCOUNTER — Other Ambulatory Visit: Payer: Self-pay | Admitting: *Deleted

## 2021-02-12 ENCOUNTER — Inpatient Hospital Stay: Payer: Medicare Other

## 2021-02-12 ENCOUNTER — Inpatient Hospital Stay: Payer: Medicare Other | Attending: Oncology

## 2021-02-12 ENCOUNTER — Other Ambulatory Visit: Payer: Self-pay

## 2021-02-12 DIAGNOSIS — N189 Chronic kidney disease, unspecified: Secondary | ICD-10-CM | POA: Insufficient documentation

## 2021-02-12 DIAGNOSIS — D631 Anemia in chronic kidney disease: Secondary | ICD-10-CM | POA: Diagnosis present

## 2021-02-12 DIAGNOSIS — D469 Myelodysplastic syndrome, unspecified: Secondary | ICD-10-CM

## 2021-02-12 DIAGNOSIS — D649 Anemia, unspecified: Secondary | ICD-10-CM

## 2021-02-12 LAB — PREPARE RBC (CROSSMATCH)

## 2021-02-12 LAB — CBC WITH DIFFERENTIAL (CANCER CENTER ONLY)
Abs Immature Granulocytes: 0.02 10*3/uL (ref 0.00–0.07)
Basophils Absolute: 0 10*3/uL (ref 0.0–0.1)
Basophils Relative: 0 %
Eosinophils Absolute: 0 10*3/uL (ref 0.0–0.5)
Eosinophils Relative: 1 %
HCT: 24.8 % — ABNORMAL LOW (ref 36.0–46.0)
Hemoglobin: 8.5 g/dL — ABNORMAL LOW (ref 12.0–15.0)
Immature Granulocytes: 1 %
Lymphocytes Relative: 15 %
Lymphs Abs: 0.6 10*3/uL — ABNORMAL LOW (ref 0.7–4.0)
MCH: 30.5 pg (ref 26.0–34.0)
MCHC: 34.3 g/dL (ref 30.0–36.0)
MCV: 88.9 fL (ref 80.0–100.0)
Monocytes Absolute: 1.1 10*3/uL — ABNORMAL HIGH (ref 0.1–1.0)
Monocytes Relative: 26 %
Neutro Abs: 2.5 10*3/uL (ref 1.7–7.7)
Neutrophils Relative %: 57 %
Platelet Count: 204 10*3/uL (ref 150–400)
RBC: 2.79 MIL/uL — ABNORMAL LOW (ref 3.87–5.11)
RDW: 16.5 % — ABNORMAL HIGH (ref 11.5–15.5)
WBC Count: 4.3 10*3/uL (ref 4.0–10.5)
nRBC: 0 % (ref 0.0–0.2)

## 2021-02-12 LAB — SAMPLE TO BLOOD BANK

## 2021-02-12 MED ORDER — ACETAMINOPHEN 325 MG PO TABS
650.0000 mg | ORAL_TABLET | Freq: Once | ORAL | Status: AC
  Administered 2021-02-12: 650 mg via ORAL

## 2021-02-12 MED ORDER — SODIUM CHLORIDE 0.9% IV SOLUTION
250.0000 mL | Freq: Once | INTRAVENOUS | Status: AC
  Administered 2021-02-12: 250 mL via INTRAVENOUS
  Filled 2021-02-12: qty 250

## 2021-02-12 MED ORDER — DIPHENHYDRAMINE HCL 25 MG PO CAPS
ORAL_CAPSULE | ORAL | Status: AC
  Filled 2021-02-12: qty 1

## 2021-02-12 MED ORDER — DIPHENHYDRAMINE HCL 25 MG PO CAPS
25.0000 mg | ORAL_CAPSULE | Freq: Once | ORAL | Status: AC
  Administered 2021-02-12: 25 mg via ORAL

## 2021-02-12 MED ORDER — ACETAMINOPHEN 325 MG PO TABS
ORAL_TABLET | ORAL | Status: AC
  Filled 2021-02-12: qty 2

## 2021-02-13 ENCOUNTER — Other Ambulatory Visit: Payer: Medicare Other

## 2021-02-13 LAB — BPAM RBC
Blood Product Expiration Date: 202208262359
ISSUE DATE / TIME: 202208011344
Unit Type and Rh: 600

## 2021-02-13 LAB — TYPE AND SCREEN
ABO/RH(D): A NEG
Antibody Screen: NEGATIVE
Unit division: 0

## 2021-02-14 ENCOUNTER — Telehealth: Payer: Self-pay

## 2021-02-14 NOTE — Chronic Care Management (AMB) (Signed)
    Patient aware of telephone appointment with Orlando Penner CPP on 02-15-2021 at 2:00. Patient aware to have/bring all medications, supplements, blood pressure and/or blood sugar logs to visit.  Questions: Have you had any recent office visit or specialist visit outside of [place]? Patient stated no  Are there any concerns you would like to discuss during your office visit? Patient stated no  Are you having any problems obtaining your medications? (Whether it pharmacy issues or cost) Patient stated no  If patient has any PAP medications ask if they are having any problems getting their PAP medication or refill? No PAPS  Care Gaps: Covid booster overdue Yearly foot exam overdue Medicare wellness 12-13-2021   Star Rating Drug: Tradjenta 5 mg- Last filled 02-02-2021 90 [facility] Atorvastatin 40 mg- Last filled 02-08-2021 [facility]  Any gaps in medications fill history? No   [place] Pharmacist Assistant [phone]

## 2021-02-15 ENCOUNTER — Ambulatory Visit (INDEPENDENT_AMBULATORY_CARE_PROVIDER_SITE_OTHER): Payer: BC Managed Care – PPO

## 2021-02-15 DIAGNOSIS — I10 Essential (primary) hypertension: Secondary | ICD-10-CM

## 2021-02-15 DIAGNOSIS — Z794 Long term (current) use of insulin: Secondary | ICD-10-CM

## 2021-02-15 DIAGNOSIS — E1122 Type 2 diabetes mellitus with diabetic chronic kidney disease: Secondary | ICD-10-CM

## 2021-02-15 DIAGNOSIS — N184 Chronic kidney disease, stage 4 (severe): Secondary | ICD-10-CM

## 2021-02-15 NOTE — Progress Notes (Signed)
Chronic Care Management Pharmacy Note  02/21/2021 Name:  Patricia Mata MRN:  [id number] DOB:  Nov 01, 1955  Summary: Patient reports that she has been taking her medication on a more consistent basis.   Recommendations/Changes made from today's visit: Recommend patient continue to check BP and Glucose at 4 times per week and document readings in a log book.   Plan: Patient is going to check BP and glucose levels at the same time each day.    Subjective: Patricia Mata is an 65 y.o. year old female who is a primary patient of [name], [place].  The CCM team was consulted for assistance with disease management and care coordination needs.    Engaged with patient by telephone for follow up visit in response to provider referral for pharmacy case management and/or care coordination services. 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 She misses her babies a lot. 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 She reports that she always wanted to be a Pharmacist, hospital. She started when she was 24 years and finished when she was 27. Her older sister passed away last week. Her mother passed in 10-09-17 and her brother died three days later. She is now the oldest girl in the family. Her husband retired to stay with her. He cooks, cleans and washes clothes and she is grateful. She has been married for 18 years and they have been together for 20 years. She met him at [facility]. Patient reports that even when she was in a coma for 2 weeks he was there everyday. She has a grand daughter who is a second Land. Her son is a Airline pilot in [place], [place] she is very proud of them.  Consent to Services:  The patient was given information about Chronic Care Management services, agreed to services, and gave verbal consent prior to initiation of services.  Please see initial visit note for detailed documentation.   Patient Care  Team: [name], FNP as PCP - General ([place]) Lorretta Harp, MD as PCP - Cardiology (Cardiology) Bensimhon, Shaune Pascal, MD as PCP - Advanced Heart Failure (Cardiology) Mayford Knife, [facility] (Pharmacist) Center, [facility]  Recent office visits: 01/31/2021 PCP OV   Recent consult visits: 01/22/2021 [facility] visits: Yes    Objective:  Lab Results  Component Value Date   CREATININE 3.31 (H) 02/21/2021   BUN 49 (H) 02/21/2021   GFRNONAA 15 (L) 02/21/2021   GFRAA 16 (L) 09/05/2020   NA 137 02/21/2021   K 3.5 02/21/2021   CALCIUM 9.1 02/21/2021   CO2 33 (H) 02/21/2021   GLUCOSE 155 (H) 02/21/2021    Lab Results  Component Value Date/Time   HGBA1C 6.1 (H) 01/13/2021 11:27 PM   HGBA1C 5.2 10/25/2020 01:01 PM   MICROALBUR 80 06/22/2019 12:02 PM    Last diabetic Eye exam:  Lab Results  Component Value Date/Time   HMDIABEYEEXA No Retinopathy 08/08/2020 12:00 AM    Last diabetic Foot exam: No results found for: HMDIABFOOTEX   Lab Results  Component Value Date   CHOL 176 11/21/2020   HDL 88 11/21/2020   LDLCALC 71 11/21/2020   TRIG 85 11/21/2020   CHOLHDL 2.0 11/21/2020    Hepatic Function Latest Ref Rng & Units 11/22/2020 11/21/2020 10/27/2020  Total Protein 6.5 - 8.1 g/dL - 7.3 6.7  Albumin 3.5 - 5.0 g/dL 3.2(L) 3.8 3.6  AST 15 - 41 U/L - 59(H) 98(H)  ALT 0 - 44 U/L - 35 60(H)  Alk Phosphatase 38 - 126 U/L - 160(H) 131(H)  Total Bilirubin 0.3 - 1.2 mg/dL - 1.1 0.8  Bilirubin, Direct 0.0 - 0.2 mg/dL - - -    Lab Results  Component Value Date/Time   TSH 1.980 01/31/2021 02:17 PM   TSH 1.880 12/04/2020 05:08 PM   FREET4 1.11 06/08/2020 02:33 AM   FREET4 1.49 04/19/2019 04:39 PM    CBC Latest Ref Rng & Units 02/21/2021 02/20/2021 02/20/2021  WBC 4.0 - 10.5 K/uL 5.3 5.2 4.7  Hemoglobin 12.0 - 15.0 g/dL 7.6(L) 5.0(LL) 5.9(LL)  Hematocrit 36.0 - 46.0 % 22.3(L) 15.3(L) 17.2(L)  Platelets 150 - 400 K/uL 162 184 171    No results  found for: VD25OH  Clinical ASCVD: Yes  The ASCVD Risk score [name], et al., 2013) failed to calculate for the following reasons:   The patient has a prior MI or stroke diagnosis    Depression screen [facility] 2/9 11/30/2020 07/26/2020 07/13/2020  Decreased Interest 0 0 0  Down, Depressed, Hopeless 0 0 0  PHQ - 2 Score 0 0 0  Altered sleeping - 0 0  Tired, decreased energy - 0 0  Change in appetite - 0 0  Feeling bad or failure about yourself  - 0 0  Trouble concentrating - 0 0  Moving slowly or fidgety/restless - 0 0  Suicidal thoughts - 0 0  PHQ-9 Score - 0 0  Some recent data might be hidden     Social History   Tobacco Use  Smoking Status Former   Packs/day: 0.25   Years: 20.00   Pack years: 5.00   Types: Cigarettes   Quit date: 1997   Years since quitting: 25.6  Smokeless Tobacco Former   BP Readings from Last 3 Encounters:  02/21/21 (!) 146/84  02/12/21 (!) 161/44  02/05/21 (!) 184/51   Pulse Readings from Last 3 Encounters:  02/21/21 74  02/12/21 65  02/05/21 (!) 57   Wt Readings from Last 3 Encounters:  02/21/21 196 lb 13.9 oz (89.3 kg)  01/31/21 196 lb 9.6 oz (89.2 kg)  01/22/21 200 lb 1.6 oz (90.8 kg)   BMI Readings from Last 3 Encounters:  02/21/21 33.79 kg/m  01/31/21 31.54 kg/m  01/22/21 34.35 kg/m    Assessment/Interventions: Review of patient past medical history, allergies, medications, health status, including review of consultants reports, laboratory and other test data, was performed as part of comprehensive evaluation and provision of chronic care management services.   SDOH:  (Social Determinants of Health) assessments and interventions performed: No  SDOH Screenings   Alcohol Screen: Not on file  Depression (PHQ2-9): Low Risk    PHQ-2 Score: 0  Financial Resource Strain: Low Risk    Difficulty of Paying Living Expenses: Not hard at all  Food Insecurity: No Food Insecurity   Worried About Charity fundraiser in the Last Year:  Never true   Ran Out of Food in the Last Year: Never true  Housing: Not on file  Physical Activity: Inactive   Days of Exercise per Week: 0 days   Minutes of Exercise per Session: 0 min  Social Connections: Not on file  Stress: No Stress Concern Present   Feeling of Stress : Not at all  Tobacco Use: Medium Risk   Smoking Tobacco Use: Former   Smokeless Tobacco Use: Former  Transport planner Needs: No Transportation Needs  Lack of Transportation (Medical): No   Lack of Transportation (Non-Medical): No    CCM Care Plan  Allergies  Allergen Reactions   Ancef [Cefazolin] Itching    Severe itching- after procedure, ancef was the antibiotic.-04/02/17 Tolerates penicillins   Lactose Intolerance (Gi) Diarrhea   Morphine And Related Itching   Sulfa Antibiotics Rash and Other (See Comments)    Blisters, also    Medications Reviewed Today     Reviewed by Mayford Knife, RPH (Pharmacist) on 02/21/21 at [place] List Status: <None>   Medication Order Taking? Sig Documenting Provider Last Dose Status Informant  acetaminophen (TYLENOL) suppository 650 mg [id number]   Rise Patience, MD  Active   acetaminophen (TYLENOL) tablet 650 mg [id number]   Rise Patience, MD  Active   allopurinol (ZYLOPRIM) 300 MG tablet [id number] Yes Take 300 mg by mouth daily. [provider] Taking Active Self  allopurinol (ZYLOPRIM) tablet 300 mg [id number]   Rise Patience, MD  Active   amiodarone (PACERONE) 200 MG tablet [id number] Yes Take 1 tablet (200 mg total) by mouth daily.  Patient not taking: Reported on 02/20/2021   Bensimhon, Shaune Pascal, MD Taking Active Self  atorvastatin (LIPITOR) 40 MG tablet [id number] Yes Take 1 tablet by mouth once daily  Patient taking differently: Take 40 mg by mouth daily.   [name], FNP Taking Active Self  atorvastatin (LIPITOR) tablet 40 mg [id number]   Rise Patience, MD  Active   blood glucose meter kit and supplies [id number] Yes Dispense  based on patient and insurance preference. Use up to four times daily as directed. (FOR ICD-10 E10.9, E11.9). [name], FNP Taking Active Self  Blood Glucose Monitoring Suppl [facility] Karsten Fells) w/Device Drucie Opitz [id number] Yes Check blood sugars twice daily E11.9 [name], FNP Taking Active Self  carvedilol (COREG) 3.125 MG tablet [id number] Yes Take 1 tablet (3.125 mg total) by mouth 2 (two) times daily. Darrick Grinder D, NP Taking Active Self  carvedilol (COREG) tablet 3.125 mg [id number]   Rise Patience, MD  Active   Chlorhexidine Gluconate Cloth 2 % PADS 6 each [id number]   Charlynne Cousins, MD  Active   cholecalciferol (VITAMIN D3) 25 MCG (1000 UNIT) tablet [id number] Yes Take 1,000 Units by mouth daily. [provider] Taking Active Self  cloNIDine (CATAPRES) 0.1 MG tablet [id number] Yes Take 1 tablet (0.1 mg total) by mouth 2 (two) times daily. Bensimhon, Shaune Pascal, MD Taking Active Self  cloNIDine (CATAPRES) tablet 0.1 mg [id number]   Rise Patience, MD  Active   EUTHYROX 150 MCG tablet [id number] Yes TAKE 1 TABLET BY MOUTH ONCE DAILY BEFORE BREAKFAST  Patient taking differently: Take 150 mcg by mouth daily before breakfast.   [name], FNP Taking Active Self  glucose blood test strip [id number] Yes check blood sugars twice daily Dx code E11.22 [name], FNP Taking Active Self  hydrALAZINE (APRESOLINE) 100 MG tablet [id number] Yes Take 1 tablet (100 mg total) by mouth 3 (three) times daily. Lorretta Harp, MD Taking Active Self  hydrALAZINE (APRESOLINE) tablet 100 mg [id number]   Rise Patience, MD  Active   insulin aspart (novoLOG) injection 0-6 Units [id number]   Rise Patience, MD  Active   Insulin Glargine [facility]) 100 UNIT/ML [id number] Yes Inject 15 Units into the skin daily as needed (only if blood sugar over 150). Only if above 150 [provider] Taking Active Self  Insulin Pen Needle 29G X 5MM MISC  [id number] Yes Use as  directed Bonnielee Haff, MD Taking Active Self  Lancets (ONETOUCH DELICA PLUS [id number]) [place] [id number] Yes check blood sugars twice daily Dx code: E11.22 [name], FNP Taking Active Self  latanoprost (XALATAN) 0.005 % ophthalmic solution [id number] Yes Place 1 drop into both eyes at bedtime.  [provider] Taking Active Self  latanoprost (XALATAN) 0.005 % ophthalmic solution 1 drop [id number]   Rise Patience, MD  Active   levothyroxine (SYNTHROID) tablet 150 mcg [id number]   Rise Patience, MD  Active   lidocaine-prilocaine (EMLA) cream [id number]  Apply 1 application topically daily as needed (port access). [provider]  Active Self  linagliptin (TRADJENTA) 5 MG TABS tablet [id number] Yes Take 1 tablet (5 mg total) by mouth daily. [name], FNP Taking Active Self  Multiple Vitamins-Minerals (CENTRUM SILVER 50+WOMEN) TABS [id number] Yes Take 1 tablet by mouth daily. [provider] Taking Active Self  polyethylene glycol (MIRALAX / GLYCOLAX) 17 g packet [id number] Yes Take 17 g by mouth daily as needed for moderate constipation. Geradine Girt, DO Taking Active Self  polyethylene glycol-electrolytes (NuLYTELY) solution 4,000 mL [id number]   Otis Brace, MD  Active   sildenafil (REVATIO) 20 MG tablet [id number] Yes TAKE 1 TABLET BY MOUTH THREE TIMES DAILY  Patient taking differently: Take 20 mg by mouth 3 (three) times daily.   Sherren Mocha, MD Taking Active Self  sildenafil (REVATIO) tablet 20 mg [id number]   Rise Patience, MD  Active             Patient Active Problem List   Diagnosis Date Noted   Anemia 01/13/2021   Obesity 11/23/2020   Joint pain 11/23/2020   Other long term (current) drug therapy 11/23/2020   Primary osteoarthritis 11/23/2020   ESRD (end stage renal disease) ([place]) 11/22/2020   Nonrheumatic mitral (valve) stenosis 10/17/2020   Coagulation defect, unspecified ([place]) 88/91/6945   Complication of vascular  dialysis catheter 10/14/2020   Peripheral vascular disease ([place]) 10/14/2020   NSTEMI (non-ST elevated myocardial infarction) ([place]) 10/10/2020   Angina pectoris ([place]) 10/09/2020   Angina at rest [facility]) 10/09/2020   Gastroesophageal reflux disease 09/12/2020   History of malignant neoplasm of colon 09/12/2020   Chronic gastritis 09/02/2020   Symptomatic anemia 08/31/2020   Abnormal uterine bleeding (AUB) 07/19/2020   Vaginal ulceration 07/19/2020   Acute renal failure superimposed on stage 4 chronic kidney disease ([place])    Palliative care by specialist    Acute on chronic diastolic CHF (congestive heart failure) ([place]) 06/07/2020   Chronic cough 05/04/2020   Allergic rhinitis 03/23/2020   Chronic kidney disease, stage 5 ([place]) 03/88/8280   Acute diastolic CHF (congestive heart failure) ([place]) 09/21/2019   HCAP (healthcare-associated pneumonia) 09/20/2019   Pulmonary hypertension ([place]) 09/20/2019   DM2 (diabetes mellitus, type 2) ([place]) 09/20/2019   Elevated troponin 09/20/2019   Severe sepsis ([place]) 09/20/2019   CAP (community acquired pneumonia) 09/14/2019   Epistaxis, recurrent 08/10/2019   Neutropenia ([place]) 05/25/2019   Malignant neoplasm of colon ([place]) 05/25/2019   Bradycardia 03/21/2019   Pneumonia due to COVID-19 virus 03/18/2019   Type 2 diabetes mellitus with hemoglobin A1c goal of less than 7.0% ([place]) 12/10/2018   Hypothyroidism 12/10/2018   Seasonal allergies 12/10/2018   Gout 09/08/2018   Bacterial infection due to Morganella morganii    Sacral wound 06/08/2018   Iron deficiency anemia due to chronic blood loss 06/08/2018   Hyponatremia 06/08/2018   Constipation 06/08/2018   Encounter for  nasogastric (NG) tube placement    Pressure injury of skin 05/22/2018   PAF (paroxysmal atrial fibrillation) ([place]) 05/11/2018   Normocytic anemia 06/14/2017   Aortic stenosis, severe 06/10/2017   Carotid artery disease ([place]) 06/10/2017   Essential hypertension 05/13/2017    Hyperlipidemia 05/13/2017   Bilateral lower extremity edema 05/13/2017   Port-A-Cath in place 04/28/2017   MDS (myelodysplastic syndrome) ([place]) 03/20/2017   Goals of care, counseling/discussion 03/20/2017   Anemia in chronic kidney disease 05/10/2016   Anemia in stage 1 chronic kidney disease 05/10/2016   Anemia due to chronic kidney disease 02/09/2016    Immunization History  Administered Date(s) Administered   Fluad Quad(high Dose 65+) 04/28/2020   Influenza,inj,Quad PF,6+ Mos 05/09/2017, 09/15/2018, 04/13/2019   Influenza-Unspecified 04/05/2016   PFIZER(Purple Top)SARS-COV-2 Vaccination 09/11/2019, 01/10/2020, 04/07/2020   Pneumococcal Conjugate-13 05/04/2020    Conditions to be addressed/monitored:  Hypertension and Diabetes  Care Plan : [place]  Updates made by Mayford Knife, [place] since 02/21/2021 12:00 AM     Problem: HTN, DM   Priority: High     Long-Range Goal: Disease Management   Recent Progress: On track  Priority: High  Note:   Current Barriers:  Unable to independently monitor therapeutic efficacy  Pharmacist Clinical Goal(s):  Patient will achieve adherence to monitoring guidelines and medication adherence to achieve therapeutic efficacy through collaboration with PharmD and provider.   Interventions: 1:1 collaboration with [name], FNP regarding development and update of comprehensive plan of care as evidenced by provider attestation and co-signature Inter-disciplinary care team collaboration (see longitudinal plan of care) Comprehensive medication review performed; medication list updated in electronic medical record  Hypertension  (Status:Goal on track: YES.)     (BP goal <130/80) -Not ideally controlled -Current treatment: Clonidine 0.1 mg tablet twice daily Hydralazine 100 mg take 1 tablet by mouth three times daily Carvedilol 3.125 mg tablet twice per day Sildenafil 20 mg tablet three times per day  -Medications  previously tried: Lexicographer,  Metoprolol Succinate 25 mg tablet  -Current home readings: 136/80, 143/74, 143/69, 137/72, 128/61, 127/66 -Current dietary habits: Her husband using [place] 50% less salt -Current exercise habits: will discuss during next visit  -Denies hypotensive/hypertensive symptoms -Educated on BP goals and benefits of medications for prevention of heart attack, stroke and kidney damage; Importance of home blood pressure monitoring; Proper BP monitoring technique; -Counseled to monitor BP at home at least three times per week, document, and provide log at future appointments -Recommended to continue current medication  Diabetes (A1c goal <7%) -Controlled -Current medications: Insulin Glargine- inject 15 units as needed for BS greater than 150  Tradjenta 5 mg tablet once per day -Medications previously tried: Janumet XR 50-500 mg, Januvia 50 mg ,   -Current home glucose readings fasting glucose: 156,118,112,184, 98 -Denies hypoglycemic/hyperglycemic symptoms -Current meal patterns:  breakfast: a sandwich - egg and bacon sandwich, oatmeal, grits and eggs with meat  lunch: she does not eat lunch  because she has a late breakfast dinner: chicken, [place] hamburger and she is eating plenty of vegetables. Her husband has a garden, and they can their vegetables so everything is fresh.  snacks: she is not currently eating any snacks  drinks: patient reports drinking plenty of water  -Current exercise: will discuss during next office visit  -Educated on A1c and blood sugar goals; Prevention and management of hypoglycemic episodes; -Counseled to check feet daily and get yearly eye exams -Recommended to continue current medication  Patient Goals/Self-Care Activities Patient  will:  - take medications as prescribed  Follow Up Plan: The patient has been provided with contact information for the care management team and has been advised to call with any health related questions  or concerns.       Medication Assistance: None required.  Patient affirms current coverage meets needs.  Compliance/Adherence/Medication fill history: Care Gaps: Shingrix Vaccine  DEXA Scan  COVID-19 second booster Influenza Vaccine  Foot Exam  Star-Rating Drugs: Atorvastatin 40 mg tablet Linagliptin 5 mg tablet   Patient's preferred pharmacy is:  [facility]  [id number] [place], [place] - [address] [address] Phone: [phone] Fax: [phone]  Uses pill box? Yes Pt endorses 85% compliance  We discussed: Benefits of medication synchronization, packaging and delivery as well as enhanced pharmacist oversight with [facility]. Patient decided to: Continue current medication management strategy  Care Plan and Follow Up Patient Decision:  Patient agrees to Care Plan and Follow-up.  Plan: The patient has been provided with contact information for the care management team and has been advised to call with any health related questions or concerns.   Orlando Penner, PharmD Clinical Pharmacist [facility] [phone]

## 2021-02-19 ENCOUNTER — Other Ambulatory Visit: Payer: Self-pay

## 2021-02-19 DIAGNOSIS — D469 Myelodysplastic syndrome, unspecified: Secondary | ICD-10-CM

## 2021-02-20 ENCOUNTER — Other Ambulatory Visit: Payer: Self-pay | Admitting: Oncology

## 2021-02-20 ENCOUNTER — Encounter: Payer: Self-pay | Admitting: *Deleted

## 2021-02-20 ENCOUNTER — Inpatient Hospital Stay: Payer: Medicare Other

## 2021-02-20 ENCOUNTER — Telehealth: Payer: Self-pay | Admitting: *Deleted

## 2021-02-20 ENCOUNTER — Encounter (HOSPITAL_COMMUNITY): Payer: Self-pay | Admitting: *Deleted

## 2021-02-20 ENCOUNTER — Other Ambulatory Visit: Payer: Medicare Other

## 2021-02-20 ENCOUNTER — Other Ambulatory Visit: Payer: Self-pay

## 2021-02-20 ENCOUNTER — Observation Stay (HOSPITAL_COMMUNITY): Payer: Medicare Other

## 2021-02-20 ENCOUNTER — Other Ambulatory Visit: Payer: Self-pay | Admitting: *Deleted

## 2021-02-20 ENCOUNTER — Inpatient Hospital Stay (HOSPITAL_COMMUNITY)
Admission: EM | Admit: 2021-02-20 | Discharge: 2021-02-23 | DRG: 811 | Disposition: A | Discharge status: Home / Self Care | Payer: Medicare Other | Attending: Internal Medicine | Admitting: Internal Medicine

## 2021-02-20 DIAGNOSIS — D631 Anemia in chronic kidney disease: Secondary | ICD-10-CM

## 2021-02-20 DIAGNOSIS — D469 Myelodysplastic syndrome, unspecified: Secondary | ICD-10-CM

## 2021-02-20 DIAGNOSIS — D62 Acute posthemorrhagic anemia: Principal | ICD-10-CM | POA: Diagnosis present

## 2021-02-20 DIAGNOSIS — K648 Other hemorrhoids: Secondary | ICD-10-CM | POA: Diagnosis present

## 2021-02-20 DIAGNOSIS — E739 Lactose intolerance, unspecified: Secondary | ICD-10-CM | POA: Diagnosis present

## 2021-02-20 DIAGNOSIS — E1151 Type 2 diabetes mellitus with diabetic peripheral angiopathy without gangrene: Secondary | ICD-10-CM | POA: Diagnosis present

## 2021-02-20 DIAGNOSIS — I959 Hypotension, unspecified: Secondary | ICD-10-CM | POA: Diagnosis not present

## 2021-02-20 DIAGNOSIS — Z885 Allergy status to narcotic agent status: Secondary | ICD-10-CM

## 2021-02-20 DIAGNOSIS — Z888 Allergy status to other drugs, medicaments and biological substances status: Secondary | ICD-10-CM

## 2021-02-20 DIAGNOSIS — Z20822 Contact with and (suspected) exposure to covid-19: Secondary | ICD-10-CM | POA: Diagnosis present

## 2021-02-20 DIAGNOSIS — E039 Hypothyroidism, unspecified: Secondary | ICD-10-CM | POA: Diagnosis present

## 2021-02-20 DIAGNOSIS — I5032 Chronic diastolic (congestive) heart failure: Secondary | ICD-10-CM | POA: Diagnosis present

## 2021-02-20 DIAGNOSIS — Z992 Dependence on renal dialysis: Secondary | ICD-10-CM

## 2021-02-20 DIAGNOSIS — D649 Anemia, unspecified: Secondary | ICD-10-CM

## 2021-02-20 DIAGNOSIS — I080 Rheumatic disorders of both mitral and aortic valves: Secondary | ICD-10-CM | POA: Diagnosis present

## 2021-02-20 DIAGNOSIS — Z9049 Acquired absence of other specified parts of digestive tract: Secondary | ICD-10-CM

## 2021-02-20 DIAGNOSIS — D61818 Other pancytopenia: Secondary | ICD-10-CM | POA: Diagnosis present

## 2021-02-20 DIAGNOSIS — R0789 Other chest pain: Secondary | ICD-10-CM | POA: Diagnosis present

## 2021-02-20 DIAGNOSIS — Z66 Do not resuscitate: Secondary | ICD-10-CM | POA: Diagnosis present

## 2021-02-20 DIAGNOSIS — Z794 Long term (current) use of insulin: Secondary | ICD-10-CM

## 2021-02-20 DIAGNOSIS — Z8049 Family history of malignant neoplasm of other genital organs: Secondary | ICD-10-CM

## 2021-02-20 DIAGNOSIS — K5730 Diverticulosis of large intestine without perforation or abscess without bleeding: Secondary | ICD-10-CM | POA: Diagnosis present

## 2021-02-20 DIAGNOSIS — I480 Paroxysmal atrial fibrillation: Secondary | ICD-10-CM | POA: Diagnosis present

## 2021-02-20 DIAGNOSIS — R079 Chest pain, unspecified: Secondary | ICD-10-CM

## 2021-02-20 DIAGNOSIS — Z833 Family history of diabetes mellitus: Secondary | ICD-10-CM

## 2021-02-20 DIAGNOSIS — Z882 Allergy status to sulfonamides status: Secondary | ICD-10-CM

## 2021-02-20 DIAGNOSIS — I252 Old myocardial infarction: Secondary | ICD-10-CM

## 2021-02-20 DIAGNOSIS — Z8249 Family history of ischemic heart disease and other diseases of the circulatory system: Secondary | ICD-10-CM

## 2021-02-20 DIAGNOSIS — Z79899 Other long term (current) drug therapy: Secondary | ICD-10-CM

## 2021-02-20 DIAGNOSIS — M898X9 Other specified disorders of bone, unspecified site: Secondary | ICD-10-CM | POA: Diagnosis present

## 2021-02-20 DIAGNOSIS — Z85038 Personal history of other malignant neoplasm of large intestine: Secondary | ICD-10-CM

## 2021-02-20 DIAGNOSIS — R195 Other fecal abnormalities: Secondary | ICD-10-CM

## 2021-02-20 DIAGNOSIS — K5521 Angiodysplasia of colon with hemorrhage: Secondary | ICD-10-CM | POA: Diagnosis present

## 2021-02-20 DIAGNOSIS — Z8042 Family history of malignant neoplasm of prostate: Secondary | ICD-10-CM

## 2021-02-20 DIAGNOSIS — N186 End stage renal disease: Secondary | ICD-10-CM | POA: Diagnosis present

## 2021-02-20 DIAGNOSIS — I272 Pulmonary hypertension, unspecified: Secondary | ICD-10-CM | POA: Diagnosis present

## 2021-02-20 DIAGNOSIS — Z830 Family history of human immunodeficiency virus [HIV] disease: Secondary | ICD-10-CM

## 2021-02-20 DIAGNOSIS — I10 Essential (primary) hypertension: Secondary | ICD-10-CM | POA: Diagnosis present

## 2021-02-20 DIAGNOSIS — I701 Atherosclerosis of renal artery: Secondary | ICD-10-CM | POA: Diagnosis present

## 2021-02-20 DIAGNOSIS — M109 Gout, unspecified: Secondary | ICD-10-CM | POA: Diagnosis present

## 2021-02-20 DIAGNOSIS — I132 Hypertensive heart and chronic kidney disease with heart failure and with stage 5 chronic kidney disease, or end stage renal disease: Secondary | ICD-10-CM | POA: Diagnosis present

## 2021-02-20 DIAGNOSIS — E1122 Type 2 diabetes mellitus with diabetic chronic kidney disease: Secondary | ICD-10-CM | POA: Diagnosis present

## 2021-02-20 DIAGNOSIS — Z87891 Personal history of nicotine dependence: Secondary | ICD-10-CM

## 2021-02-20 LAB — CBC WITH DIFFERENTIAL/PLATELET
Abs Immature Granulocytes: 0.04 10*3/uL (ref 0.00–0.07)
Basophils Absolute: 0 10*3/uL (ref 0.0–0.1)
Basophils Relative: 0 %
Eosinophils Absolute: 0 10*3/uL (ref 0.0–0.5)
Eosinophils Relative: 0 %
HCT: 15.3 % — ABNORMAL LOW (ref 36.0–46.0)
Hemoglobin: 5 g/dL — CL (ref 12.0–15.0)
Immature Granulocytes: 1 %
Lymphocytes Relative: 11 %
Lymphs Abs: 0.6 10*3/uL — ABNORMAL LOW (ref 0.7–4.0)
MCH: 29.2 pg (ref 26.0–34.0)
MCHC: 32.7 g/dL (ref 30.0–36.0)
MCV: 89.5 fL (ref 80.0–100.0)
Monocytes Absolute: 0.9 10*3/uL (ref 0.1–1.0)
Monocytes Relative: 18 %
Neutro Abs: 3.6 10*3/uL (ref 1.7–7.7)
Neutrophils Relative %: 70 %
Platelets: 184 10*3/uL (ref 150–400)
RBC: 1.71 MIL/uL — ABNORMAL LOW (ref 3.87–5.11)
RDW: 17.6 % — ABNORMAL HIGH (ref 11.5–15.5)
WBC: 5.2 10*3/uL (ref 4.0–10.5)
nRBC: 3.3 % — ABNORMAL HIGH (ref 0.0–0.2)

## 2021-02-20 LAB — BASIC METABOLIC PANEL
Anion gap: 10 (ref 5–15)
BUN: 30 mg/dL — ABNORMAL HIGH (ref 8–23)
CO2: 33 mmol/L — ABNORMAL HIGH (ref 22–32)
Calcium: 8.5 mg/dL — ABNORMAL LOW (ref 8.9–10.3)
Chloride: 96 mmol/L — ABNORMAL LOW (ref 98–111)
Creatinine, Ser: 2.5 mg/dL — ABNORMAL HIGH (ref 0.44–1.00)
GFR, Estimated: 21 mL/min — ABNORMAL LOW (ref >60)
Glucose, Bld: 136 mg/dL — ABNORMAL HIGH (ref 70–99)
Potassium: 3.1 mmol/L — ABNORMAL LOW (ref 3.5–5.1)
Sodium: 139 mmol/L (ref 135–145)

## 2021-02-20 LAB — CBC WITH DIFFERENTIAL (CANCER CENTER ONLY)
Abs Immature Granulocytes: 0.03 10*3/uL (ref 0.00–0.07)
Basophils Absolute: 0 10*3/uL (ref 0.0–0.1)
Basophils Relative: 0 %
Eosinophils Absolute: 0 10*3/uL (ref 0.0–0.5)
Eosinophils Relative: 0 %
HCT: 17.2 % — ABNORMAL LOW (ref 36.0–46.0)
Hemoglobin: 5.9 g/dL — CL (ref 12.0–15.0)
Immature Granulocytes: 1 %
Lymphocytes Relative: 9 %
Lymphs Abs: 0.4 10*3/uL — ABNORMAL LOW (ref 0.7–4.0)
MCH: 30.1 pg (ref 26.0–34.0)
MCHC: 34.3 g/dL (ref 30.0–36.0)
MCV: 87.8 fL (ref 80.0–100.0)
Monocytes Absolute: 0.9 10*3/uL (ref 0.1–1.0)
Monocytes Relative: 19 %
Neutro Abs: 3.4 10*3/uL (ref 1.7–7.7)
Neutrophils Relative %: 71 %
Platelet Count: 171 10*3/uL (ref 150–400)
RBC: 1.96 MIL/uL — ABNORMAL LOW (ref 3.87–5.11)
RDW: 17.3 % — ABNORMAL HIGH (ref 11.5–15.5)
WBC Count: 4.7 10*3/uL (ref 4.0–10.5)
nRBC: 3.4 % — ABNORMAL HIGH (ref 0.0–0.2)

## 2021-02-20 LAB — POC OCCULT BLOOD, ED: Fecal Occult Bld: POSITIVE — AB

## 2021-02-20 LAB — RESP PANEL BY RT-PCR (FLU A&B, COVID) ARPGX2
Influenza A by PCR: NEGATIVE
Influenza B by PCR: NEGATIVE
SARS Coronavirus 2 by RT PCR: NEGATIVE

## 2021-02-20 LAB — PREPARE RBC (CROSSMATCH)

## 2021-02-20 LAB — SAMPLE TO BLOOD BANK

## 2021-02-20 LAB — TROPONIN I (HIGH SENSITIVITY)
Troponin I (High Sensitivity): 31 ng/L — ABNORMAL HIGH (ref <18)
Troponin I (High Sensitivity): 29 ng/L — ABNORMAL HIGH (ref <18)

## 2021-02-20 LAB — CBG MONITORING, ED: Glucose-Capillary: 158 mg/dL — ABNORMAL HIGH (ref 70–99)

## 2021-02-20 LAB — PROTIME-INR
INR: 1 (ref 0.8–1.2)
Prothrombin Time: 12.9 seconds (ref 11.4–15.2)

## 2021-02-20 MED ORDER — ALLOPURINOL 300 MG PO TABS
300.0000 mg | ORAL_TABLET | Freq: Every day | ORAL | Status: DC
  Administered 2021-02-21 – 2021-02-23 (×2): 300 mg via ORAL
  Filled 2021-02-20 (×2): qty 1

## 2021-02-20 MED ORDER — ACETAMINOPHEN 325 MG PO TABS
650.0000 mg | ORAL_TABLET | Freq: Once | ORAL | Status: AC
  Administered 2021-02-20: 650 mg via ORAL
  Filled 2021-02-20: qty 2

## 2021-02-20 MED ORDER — DIPHENHYDRAMINE HCL 25 MG PO CAPS
25.0000 mg | ORAL_CAPSULE | Freq: Once | ORAL | Status: AC
  Administered 2021-02-20: 25 mg via ORAL
  Filled 2021-02-20: qty 1

## 2021-02-20 MED ORDER — ACETAMINOPHEN 325 MG PO TABS
650.0000 mg | ORAL_TABLET | Freq: Four times a day (QID) | ORAL | Status: DC | PRN
  Administered 2021-02-21: 650 mg via ORAL
  Filled 2021-02-20: qty 2

## 2021-02-20 MED ORDER — INSULIN ASPART 100 UNIT/ML IJ SOLN
0.0000 [IU] | Freq: Three times a day (TID) | INTRAMUSCULAR | Status: DC
  Administered 2021-02-23: 1 [IU] via SUBCUTANEOUS
  Filled 2021-02-20: qty 0.06

## 2021-02-20 MED ORDER — HYDRALAZINE HCL 50 MG PO TABS
100.0000 mg | ORAL_TABLET | Freq: Three times a day (TID) | ORAL | Status: DC
  Administered 2021-02-20 – 2021-02-23 (×6): 100 mg via ORAL
  Filled 2021-02-20 (×7): qty 2

## 2021-02-20 MED ORDER — SILDENAFIL CITRATE 20 MG PO TABS
20.0000 mg | ORAL_TABLET | Freq: Three times a day (TID) | ORAL | Status: DC
  Administered 2021-02-20 – 2021-02-23 (×6): 20 mg via ORAL
  Filled 2021-02-20 (×11): qty 1

## 2021-02-20 MED ORDER — SODIUM CHLORIDE 0.9 % IV SOLN
10.0000 mL/h | Freq: Once | INTRAVENOUS | Status: AC
  Administered 2021-02-20: 10 mL/h via INTRAVENOUS

## 2021-02-20 MED ORDER — CARVEDILOL 3.125 MG PO TABS
3.1250 mg | ORAL_TABLET | Freq: Two times a day (BID) | ORAL | Status: DC
  Administered 2021-02-20 – 2021-02-23 (×5): 3.125 mg via ORAL
  Filled 2021-02-20 (×5): qty 1

## 2021-02-20 MED ORDER — ATORVASTATIN CALCIUM 40 MG PO TABS
40.0000 mg | ORAL_TABLET | Freq: Every day | ORAL | Status: DC
  Administered 2021-02-21 – 2021-02-23 (×2): 40 mg via ORAL
  Filled 2021-02-20 (×2): qty 1

## 2021-02-20 MED ORDER — LATANOPROST 0.005 % OP SOLN
1.0000 [drp] | Freq: Every day | OPHTHALMIC | Status: DC
  Administered 2021-02-21 – 2021-02-22 (×2): 1 [drp] via OPHTHALMIC
  Filled 2021-02-20 (×2): qty 2.5

## 2021-02-20 MED ORDER — LEVOTHYROXINE SODIUM 75 MCG PO TABS
150.0000 ug | ORAL_TABLET | Freq: Every day | ORAL | Status: DC
  Administered 2021-02-21 – 2021-02-23 (×2): 150 ug via ORAL
  Filled 2021-02-20: qty 2
  Filled 2021-02-20: qty 1

## 2021-02-20 MED ORDER — ZOLPIDEM TARTRATE 5 MG PO TABS
5.0000 mg | ORAL_TABLET | Freq: Once | ORAL | Status: AC
  Administered 2021-02-20: 5 mg via ORAL
  Filled 2021-02-20: qty 1

## 2021-02-20 MED ORDER — ACETAMINOPHEN 650 MG RE SUPP: 650.0000 mg | Freq: Four times a day (QID) | RECTAL | Status: DC | PRN

## 2021-02-20 MED ORDER — CLONIDINE HCL 0.1 MG PO TABS
0.1000 mg | ORAL_TABLET | Freq: Two times a day (BID) | ORAL | Status: DC
  Administered 2021-02-20 – 2021-02-23 (×5): 0.1 mg via ORAL
  Filled 2021-02-20 (×5): qty 1

## 2021-02-20 NOTE — Progress Notes (Signed)
Critical Value received Hgb 5.9 @ 1334.  Mingo Amber, RN with Dr Alen Blew aware @  [phone].  Pt scheduled for transfusion.

## 2021-02-20 NOTE — Telephone Encounter (Signed)
CRITICAL VALUE STICKER  CRITICAL VALUE: Hgb 5.9  RECEIVER (on-site recipient of call): L. Chilcott RN, notified J. Knute Mazzuca RN  DATE & TIME NOTIFIED: 02/20/21 @ 1340  MESSENGER (representative from lab):  MD NOTIFIED: Dr. Alen Blew  TIME OF NOTIFICATION: 8341  RESPONSE:  Pt to receive 2 units of blood on 02/21/21

## 2021-02-20 NOTE — ED Notes (Signed)
Patient c/o intermittent mild left upper chest discomfort. Patient also stated she was itching. Blood transfusion stopped Dr.Butler was notified and ordered 25mg  Benadryl. Blood transfusion restarted  Upon returning to room patient stated she felt better no chest pain

## 2021-02-20 NOTE — ED Provider Notes (Signed)
Emergency Medicine Provider Triage Evaluation Note  Haylen Bellotti , a 65 y.o. female  was evaluated in triage.  Pt complains of being directed here by cancer center for low hemoglobin of 5.9.  Labs drawn at cancer center this morning.  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  Some left-sided chest discomfort and weakness  Review of Systems  Positive: Weakness, also chest discomfort Negative: Palpitations syncope  Physical Exam  BP (!) 165/54 (BP Location: Left Arm)   Pulse 85   Temp 98.4 F (36.9 C) (Oral)   Resp 18   SpO2 97%  Gen:   Awake, no distress   Resp:  Normal effort  MSK:   Moves extremities without difficulty  Other:  RRR no M/R/G.  Lungs CTA B.  Medical Decision Making  Medically screening exam initiated at 2:25 PM.  Appropriate orders placed.  Delisha Peaden was informed that the remainder of the evaluation will be completed by another provider, this initial triage assessment does not replace that evaluation, and the importance of remaining in the ED until their evaluation is complete.  Labs drawn in cancer center this morning, pending This chart was dictated using voice recognition software, Dragon. Despite the best efforts of this provider to proofread and correct errors, errors may still occur which can change documentation meaning.     Emeline Darling, PA-C 35/57/32 2025    Lianne Cure, DO 42/70/62 1740

## 2021-02-20 NOTE — ED Provider Notes (Signed)
[facility] DEPT Provider Note   CSN: [id number] Arrival date & time: 02/20/21  1404     History Chief Complaint  Patient presents with   Abnormal Lab    Low Hgb    Patricia Mata is a 65 y.o. female.  She has a history of end-stage renal disease and gets dialysis Tuesday Thursday Saturday.  She is also been experiencing anemia and has required transfusions 2 units 2 times last month.  She was at dialysis today and her blood pressure was low and they checked her hemoglobin and found it to be 5.9.  She has an appoint with a cancer center tomorrow but felt that she needed to get transfused today because it caused her chest pain before when she got really low.  She said she is experiencing just a little bit of chest pain or shortness of breath here.  She had a full run of dialysis today.  She does not know where the bleeding is from although she has had some dark stools.  She said she has had an upper endoscopy and is supposed to get a colonoscopy soon.  The history is provided by the patient.  Abnormal Lab Time since result:  Hours Patient referred by:  Specialist Resulting agency:  Internal Result type: hematology   Hematology:    Hemoglobin:  Low Chest Pain Pain location:  Substernal area Pain quality: aching   Pain radiates to:  Does not radiate Pain severity:  Mild Onset quality:  Gradual Duration:  3 days Timing:  Intermittent Progression:  Unchanged Chronicity:  Recurrent Relieved by:  None tried Worsened by:  Nothing Ineffective treatments:  None tried Associated symptoms: fatigue and shortness of breath   Associated symptoms: no abdominal pain, no cough, no diaphoresis, no fever, no headache, no nausea and no vomiting   Risk factors: diabetes mellitus       Past Medical History:  Diagnosis Date   Acute renal failure (ARF) ([place]) 06/08/2018   TTHS- [address]   Carotid artery disease ([place])    Chronic diastolic (congestive)  heart failure ([place])    Colon cancer ([place]) 2008   Diabetes mellitus ([place])    Dyspnea    Gout 09/08/2018   Heart attack ([place])    Heart murmur    Hypertension    Hypothyroidism    Macrocytic anemia    MDS (myelodysplastic syndrome) (HCC)    Moderate mitral stenosis    PAD (peripheral artery disease) (HCC)    PAF (paroxysmal atrial fibrillation) (HCC)    Pulmonary hypertension (HCC)    Renal artery stenosis (HCC)    a. >60% R renal artery stenosis by duplex 2021, managed medically.   Severe aortic stenosis    Stage 4 chronic kidney disease (HCC)    Transfusion-dependent anemia     Patient Active Problem List   Diagnosis Date Noted   Anemia 01/13/2021   Obesity 11/23/2020   Joint pain 11/23/2020   Other long term (current) drug therapy 11/23/2020   Primary osteoarthritis 11/23/2020   ESRD (end stage renal disease) ([place]) 11/22/2020   Nonrheumatic mitral (valve) stenosis 10/17/2020   Coagulation defect, unspecified ([place]) 40/98/1191   Complication of vascular dialysis catheter 10/14/2020   Peripheral vascular disease ([place]) 10/14/2020   NSTEMI (non-ST elevated myocardial infarction) ([place]) 10/10/2020   Angina pectoris ([place]) 10/09/2020   Angina at rest [facility]) 10/09/2020   Gastroesophageal reflux disease 09/12/2020   History of malignant neoplasm of colon 09/12/2020   Chronic gastritis 09/02/2020  Symptomatic anemia 08/31/2020   Abnormal uterine bleeding (AUB) 07/19/2020   Vaginal ulceration 07/19/2020   Acute renal failure superimposed on stage 4 chronic kidney disease ([place])    Palliative care by specialist    Acute on chronic diastolic CHF (congestive heart failure) ([place]) 06/07/2020   Chronic cough 05/04/2020   Allergic rhinitis 03/23/2020   Chronic kidney disease, stage 5 ([place]) 55/73/2202   Acute diastolic CHF (congestive heart failure) ([place]) 09/21/2019   HCAP (healthcare-associated pneumonia) 09/20/2019   Pulmonary hypertension ([place]) 09/20/2019   DM2 (diabetes mellitus,  type 2) ([place]) 09/20/2019   Elevated troponin 09/20/2019   Severe sepsis ([place]) 09/20/2019   CAP (community acquired pneumonia) 09/14/2019   Epistaxis, recurrent 08/10/2019   Neutropenia ([place]) 05/25/2019   Malignant neoplasm of colon ([place]) 05/25/2019   Bradycardia 03/21/2019   Pneumonia due to COVID-19 virus 03/18/2019   Type 2 diabetes mellitus with hemoglobin A1c goal of less than 7.0% ([place]) 12/10/2018   Hypothyroidism 12/10/2018   Seasonal allergies 12/10/2018   Gout 09/08/2018   Bacterial infection due to Morganella morganii    Sacral wound 06/08/2018   Iron deficiency anemia due to chronic blood loss 06/08/2018   Hyponatremia 06/08/2018   Constipation 06/08/2018   Encounter for nasogastric (NG) tube placement    Pressure injury of skin 05/22/2018   PAF (paroxysmal atrial fibrillation) ([place]) 05/11/2018   Normocytic anemia 06/14/2017   Aortic stenosis, severe 06/10/2017   Carotid artery disease ([place]) 06/10/2017   Essential hypertension 05/13/2017   Hyperlipidemia 05/13/2017   Bilateral lower extremity edema 05/13/2017   Port-A-Cath in place 04/28/2017   MDS (myelodysplastic syndrome) ([place]) 03/20/2017   Goals of care, counseling/discussion 03/20/2017   Anemia in chronic kidney disease 05/10/2016   Anemia in stage 1 chronic kidney disease 05/10/2016   Anemia due to chronic kidney disease 02/09/2016    Past Surgical History:  Procedure Laterality Date   AV FISTULA PLACEMENT Right 09/26/2020   Procedure: RIGHT ARM ARTERIOVENOUS (AV) FISTULA CREATION;  Surgeon: Waynetta Sandy, MD;  Location: [facility];  Service: Vascular;  Laterality: Right;  Monitor Anesthesia Care with Regional Block to Right Arm    [place] Right 12/13/2020   Procedure: RIGHT UPPER EXTREMITY ARTERIOVENOUS FISTULA TRANSPOSITION;  Surgeon: Waynetta Sandy, MD;  Location: [facility];  Service: Vascular;  Laterality: Right;   COLON SURGERY     DEBRIDMENT OF DECUBITUS ULCER N/A  06/12/2018   Procedure: DEBRIDMENT OF DECUBITUS ULCER;  Surgeon: Wallace Going, DO;  Location: [facility];  Service: Plastics;  Laterality: N/A;   ESOPHAGOGASTRODUODENOSCOPY N/A 09/01/2020   Procedure: ESOPHAGOGASTRODUODENOSCOPY (EGD);  Surgeon: Clarene Essex, MD;  Location: [facility];  Service: Endoscopy;  Laterality: N/A;   GIVENS CAPSULE STUDY N/A 09/01/2020   Procedure: GIVENS CAPSULE STUDY;  Surgeon: Clarene Essex, MD;  Location: [facility];  Service: Endoscopy;  Laterality: N/A;   IR FLUORO GUIDE CV LINE RIGHT  10/11/2020   IR FLUORO GUIDE PORT INSERTION RIGHT  04/02/2017   IR REMOVAL TUN ACCESS W/ PORT W/O FL MOD SED  03/16/2019   IR US GUIDE VASC ACCESS RIGHT  04/02/2017   IR US GUIDE VASC ACCESS RIGHT  10/11/2020   LIGATION OF COMPETING BRANCHES OF ARTERIOVENOUS FISTULA Right 12/13/2020   Procedure: LIGATION OF COMPETING BRANCHES OF RIGHT UPPER EXTREMITY ARTERIOVENOUS FISTULA;  Surgeon: Waynetta Sandy, MD;  Location: [facility];  Service: Vascular;  Laterality: Right;   RIGHT HEART CATH N/A 04/23/2019   Procedure: RIGHT HEART CATH;  Surgeon: Haroldine Laws,  Shaune Pascal, MD;  Location: [facility];  Service: Cardiovascular;  Laterality: N/A;   RIGHT/LEFT HEART CATH AND CORONARY ANGIOGRAPHY N/A 05/21/2018   Procedure: RIGHT/LEFT HEART CATH AND CORONARY ANGIOGRAPHY;  Surgeon: Nelva Bush, MD;  Location: [facility];  Service: Cardiovascular;  Laterality: N/A;   RIGHT/LEFT HEART CATH AND CORONARY ANGIOGRAPHY N/A 10/13/2020   Procedure: RIGHT/LEFT HEART CATH AND CORONARY ANGIOGRAPHY;  Surgeon: Jolaine Artist, MD;  Location: [facility];  Service: Cardiovascular;  Laterality: N/A;   TUBAL LIGATION       OB History   No obstetric history on file.     Family History  Problem Relation Age of Onset   Hypertension Mother    Diabetes Mother    Cervical cancer Mother    Heart attack Father    Hypertension Sister    Hypertension Brother    Hypertension Sister     Hypertension Sister    Prostate cancer Brother    HIV/AIDS Brother     Social History   Tobacco Use   Smoking status: Former    Packs/day: 0.25    Years: 20.00    Pack years: 5.00    Types: Cigarettes    Quit date: 1997    Years since quitting: 25.6   Smokeless tobacco: Former  Scientific laboratory technician Use: Never used  Substance Use Topics   Alcohol use: Yes    Alcohol/week: 1.0 standard drink    Types: 1 Standard drinks or equivalent per week    Comment: occ   Drug use: Never    Home Medications Prior to Admission medications   Medication Sig Start Date End Date Taking? Authorizing Provider  allopurinol (ZYLOPRIM) 300 MG tablet Take 300 mg by mouth daily. 07/05/20   [provider]  amiodarone (PACERONE) 200 MG tablet Take 1 tablet (200 mg total) by mouth daily. 07/19/20   Bensimhon, Shaune Pascal, MD  atorvastatin (LIPITOR) 40 MG tablet Take 1 tablet by mouth once daily 11/13/20   [name], FNP  blood glucose meter kit and supplies Dispense based on patient and insurance preference. Use up to four times daily as directed. (FOR ICD-10 E10.9, E11.9). 11/04/18   [name], FNP  Blood Glucose Monitoring Suppl [facility] Karsten Fells) w/Device KIT Check blood sugars twice daily E11.9 11/30/19   [name], FNP  carvedilol (COREG) 3.125 MG tablet Take 1 tablet (3.125 mg total) by mouth 2 (two) times daily. 11/27/20   Clegg, Amy D, NP  cholecalciferol (VITAMIN D3) 25 MCG (1000 UNIT) tablet Take 1,000 Units by mouth daily.    [provider]  cloNIDine (CATAPRES) 0.1 MG tablet Take 1 tablet (0.1 mg total) by mouth 2 (two) times daily. 07/03/20   Bensimhon, Shaune Pascal, MD  colchicine 0.6 MG tablet Take 0.6 mg by mouth daily as needed (gout).    [provider]  EUTHYROX 150 MCG tablet TAKE 1 TABLET BY MOUTH ONCE DAILY BEFORE BREAKFAST 02/12/21   [name], FNP  glucose blood test strip check blood sugars twice daily Dx code E11.22 03/02/20   [name], FNP   hydrALAZINE (APRESOLINE) 100 MG tablet Take 1 tablet (100 mg total) by mouth 3 (three) times daily. 09/27/20   Lorretta Harp, MD  Insulin Glargine [facility]) 100 UNIT/ML Inject 15 Units into the skin daily. Only if above 150    [provider]  Insulin Pen Needle 29G X 5MM MISC Use as directed 06/16/17   Bonnielee Haff,  MD  Lancets (ONETOUCH DELICA PLUS [id number]) MISC check blood sugars twice daily Dx code: E11.22 03/02/20   [name], FNP  latanoprost (XALATAN) 0.005 % ophthalmic solution Place 1 drop into both eyes at bedtime.     [provider]  linagliptin (TRADJENTA) 5 MG TABS tablet Take 1 tablet (5 mg total) by mouth daily. 12/12/20   [name], FNP  Multiple Vitamins-Minerals (CENTRUM SILVER 50+WOMEN) TABS Take 1 tablet by mouth daily.    [provider]  polyethylene glycol (MIRALAX / GLYCOLAX) 17 g packet Take 17 g by mouth daily as needed for moderate constipation. 01/14/21   Geradine Girt, DO  sildenafil (REVATIO) 20 MG tablet TAKE 1 TABLET BY MOUTH THREE TIMES DAILY 02/05/21   Sherren Mocha, MD    Allergies    Ancef [cefazolin], Lactose intolerance (gi), Morphine and related, and Sulfa antibiotics  Review of Systems   Review of Systems  Constitutional:  Positive for fatigue. Negative for diaphoresis and fever.  HENT:  Negative for sore throat.   Eyes:  Negative for visual disturbance.  Respiratory:  Positive for shortness of breath. Negative for cough.   Cardiovascular:  Positive for chest pain.  Gastrointestinal:  Negative for abdominal pain, nausea and vomiting.  Genitourinary:  Negative for vaginal bleeding.  Musculoskeletal:  Negative for neck pain.  Skin:  Negative for rash.  Neurological:  Negative for headaches.   Physical Exam Updated Vital Signs BP (!) 141/38   Pulse 78   Temp 98.4 F (36.9 C) (Oral)   Resp 19   SpO2 98%   Physical Exam Vitals and nursing note reviewed.  Constitutional:      General: She is  not in acute distress.    Appearance: Normal appearance. She is well-developed.  HENT:     Head: Normocephalic and atraumatic.  Eyes:     Conjunctiva/sclera: Conjunctivae normal.  Cardiovascular:     Rate and Rhythm: Normal rate and regular rhythm.     Heart sounds: Murmur heard.  Pulmonary:     Effort: Pulmonary effort is normal. No respiratory distress.     Breath sounds: Normal breath sounds.  Abdominal:     Palpations: Abdomen is soft.     Tenderness: There is no abdominal tenderness.  Musculoskeletal:        General: No deformity or signs of injury. Normal range of motion.     Cervical back: Neck supple.     Comments: Fistula left upper extremity  Skin:    General: Skin is warm and dry.  Neurological:     General: No focal deficit present.     Mental Status: She is alert.    ED Results / Procedures / Treatments   Labs (all labs ordered are listed, but only abnormal results are displayed) Labs Reviewed  BASIC METABOLIC PANEL - Abnormal; Notable for the following components:      Result Value   Potassium 3.1 (*)    Chloride 96 (*)    CO2 33 (*)    Glucose, Bld 136 (*)    BUN 30 (*)    Creatinine, Ser 2.50 (*)    Calcium 8.5 (*)    GFR, Estimated 21 (*)    All other components within normal limits  CBC WITH DIFFERENTIAL/PLATELET - Abnormal; Notable for the following components:   RBC 1.71 (*)    Hemoglobin 5.0 (*)    HCT 15.3 (*)    RDW 17.6 (*)    nRBC 3.3 (*)  Lymphs Abs 0.6 (*)    All other components within normal limits  BASIC METABOLIC PANEL - Abnormal; Notable for the following components:   Chloride 96 (*)    CO2 33 (*)    Glucose, Bld 155 (*)    BUN 49 (*)    Creatinine, Ser 3.31 (*)    GFR, Estimated 15 (*)    All other components within normal limits  CBC - Abnormal; Notable for the following components:   RBC 2.52 (*)    Hemoglobin 7.6 (*)    HCT 22.3 (*)    RDW 15.8 (*)    nRBC 3.0 (*)    All other components within normal limits  POC  OCCULT BLOOD, ED - Abnormal; Notable for the following components:   Fecal Occult Bld POSITIVE (*)    All other components within normal limits  CBG MONITORING, ED - Abnormal; Notable for the following components:   Glucose-Capillary 158 (*)    All other components within normal limits  CBG MONITORING, ED - Abnormal; Notable for the following components:   Glucose-Capillary 150 (*)    All other components within normal limits  TROPONIN I (HIGH SENSITIVITY) - Abnormal; Notable for the following components:   Troponin I (High Sensitivity) 31 (*)    All other components within normal limits  TROPONIN I (HIGH SENSITIVITY) - Abnormal; Notable for the following components:   Troponin I (High Sensitivity) 29 (*)    All other components within normal limits  RESP PANEL BY RT-PCR (FLU A&B, COVID) [id number]  PROTIME-INR    EKG EKG Interpretation  Date/Time:  Tuesday February 20 2021 14:43:01 EDT Ventricular Rate:  80 PR Interval:  154 QRS Duration: 92 QT Interval:  482 QTC Calculation: 557 R Axis:   33 Text Interpretation: Sinus rhythm Probable LVH with secondary repol abnrm Prolonged QT interval No significant change since prior 7/22 Confirmed by Aletta Edouard [phone]) on 02/20/2021 3:27:50 PM  Radiology DG CHEST PORT 1 VIEW  Result Date: 02/20/2021 CLINICAL DATA:  Chest pain EXAM: PORTABLE CHEST 1 VIEW COMPARISON:  01/13/2021 FINDINGS: Cardiac shadow is mildly prominent but accentuated by the portable technique. Dialysis catheter is noted in satisfactory position. Aortic calcifications are noted. Patient is rotated to the right accentuating the mediastinal markings. No focal infiltrate or effusion is seen. IMPRESSION: No acute abnormality noted. Electronically Signed   By: Inez Catalina M.D.   On: 02/20/2021 23:53    Procedures .Critical Care  Date/Time: 02/21/2021 11:18 AM Performed by: Hayden Rasmussen, MD Authorized by: Hayden Rasmussen, MD   Critical care provider statement:     Critical care time (minutes):  45   Critical care time was exclusive of:  Separately billable procedures and treating other patients   Critical care was necessary to treat or prevent imminent or life-threatening deterioration of the following conditions:  Circulatory failure   Critical care was time spent personally by me on the following activities:  Discussions with consultants, evaluation of patient's response to treatment, examination of patient, ordering and performing treatments and interventions, ordering and review of laboratory studies, ordering and review of radiographic studies, pulse oximetry, re-evaluation of patient's condition, obtaining history from patient or surrogate, review of old charts and development of treatment plan with patient or surrogate   Medications Ordered in ED Medications  allopurinol (ZYLOPRIM) tablet 300 mg (300 mg Oral Given 02/21/21 0858)  atorvastatin (LIPITOR) tablet 40 mg (40 mg Oral Given 02/21/21 0857)  carvedilol (COREG) tablet 3.125 mg (3.125 mg  Oral Given 02/21/21 0858)  cloNIDine (CATAPRES) tablet 0.1 mg (0.1 mg Oral Given 02/21/21 0857)  hydrALAZINE (APRESOLINE) tablet 100 mg (100 mg Oral Given 02/21/21 0858)  sildenafil (REVATIO) tablet 20 mg (20 mg Oral Given 02/21/21 0858)  levothyroxine (SYNTHROID) tablet 150 mcg (150 mcg Oral Given 02/21/21 0639)  latanoprost (XALATAN) 0.005 % ophthalmic solution 1 drop (1 drop Both Eyes Not Given 02/20/21 2309)  insulin aspart (novoLOG) injection 0-6 Units (0 Units Subcutaneous Not Given 02/21/21 0847)  acetaminophen (TYLENOL) tablet 650 mg (650 mg Oral Given 02/21/21 0237)    Or  acetaminophen (TYLENOL) suppository 650 mg ( Rectal See Alternative 02/21/21 0237)  polyethylene glycol-electrolytes (NuLYTELY) solution 4,000 mL (has no administration in time range)  0.9 %  sodium chloride infusion (0 mL/hr Intravenous Stopped 02/20/21 2319)  acetaminophen (TYLENOL) tablet 650 mg (650 mg Oral Given 02/20/21 1655)   diphenhydrAMINE (BENADRYL) capsule 25 mg (25 mg Oral Given 02/20/21 1655)  diphenhydrAMINE (BENADRYL) capsule 25 mg (25 mg Oral Given 02/20/21 2005)  zolpidem (AMBIEN) tablet 5 mg (5 mg Oral Given 02/20/21 2341)  diphenhydrAMINE (BENADRYL) capsule 25 mg (25 mg Oral Given 02/21/21 6301)    ED Course  I have reviewed the triage vital signs and the nursing notes.  Pertinent labs & imaging results that were available during my care of the patient were reviewed by me and considered in my medical decision making (see chart for details).  Clinical Course as of 02/21/21 1117  Tue Feb 20, 2021  1615 Rectal exam done with nurse as chaperone.  Normal tone no obvious masses or gross blood.  Sample sent to lab for guaiac [MB]  1650 Patient has had multiple endoscopies and a capsule study as recent as February. [MB]  1941 Discussed with [facility] hospitalist Dr. Hal Hope who will evaluate the patient for admission. [MB]    Clinical Course User Index [MB] Hayden Rasmussen, MD   MDM Rules/Calculators/A&P                          This patient complains of anemia chest pain possible melena; this involves an extensive number of treatment Options and is a complaint that carries with it a high risk of complications and Morbidity. The differential includes GI bleed, symptomatic anemia, ACS  I ordered, reviewed and interpreted labs, which included CBC with normal white count, hemoglobin critically low at 5.0, chemistries abnormal although consistent with recent dialysis, fecal occult positive, COVID testing negative, troponins mildly elevated but flat I ordered medication 2 units packed red blood cells I ordered imaging studies which included chest x-ray and I independently    visualized and interpreted imaging which showed no acute disease Previous records obtained and reviewed in epic, she follows with oncology for her MDS and chronic transfusions, she is also seen GI in the past for GI bleed work-up I  consulted Dr. Hal Hope [facility] hospitalist and discussed lab and imaging findings  Critical Interventions: Transfusion of packed red blood cells for symptomatic anemia possible GI bleed  After the interventions stated above, I reevaluated the patient and found patient to be hemodynamically stable.  She will be admitted to the hospital for continued management of her anemia and possible further GI work-up.   Final Clinical Impression(s) / ED Diagnoses Final diagnoses:  Symptomatic anemia  ESRD (end stage renal disease) ([place])  Heme positive stool    Rx / DC Orders ED Discharge Orders     None  Hayden Rasmussen, MD 02/21/21 1121

## 2021-02-20 NOTE — ED Triage Notes (Signed)
Pt went to dialysis today, was told her Hgb was low. No SOB, reports some weakness.

## 2021-02-20 NOTE — H&P (Signed)
History and Physical    Patricia Mata DOB: 06-03-1956 DOA: 02/20/2021  PCP: [name], FNP  Patient coming from: Home.  Chief Complaint: Low hemoglobin.  HPI: Patricia Mata is a 65 y.o. female with history of ESRD on hemodialysis, MDS who has required transfusions every 2 weeks, aortic stenosis, diabetes mellitus presents to the ER after patient's hemoglobin was found to be low during dialysis and was referred to the ER.  This morning patient did have some chest pressure off and on lasting few minutes each time.  Patient also has been noticing some black stools off and on.  Denies taking any NSAIDs or blood thinners.  ED Course: In the ER patient's hemoglobin is 5.  High sensitive troponins were 31 and 29 EKG shows nonspecific changes.  COVID test was negative.  Patient is admitted for symptomatic anemia.  Patient did get dialysis today.  Review of Systems: As per HPI, rest all negative.   Past Medical History:  Diagnosis Date   Acute renal failure (ARF) ([place]) 06/08/2018   TTHS- [place]   Carotid artery disease [facility])    Chronic diastolic (congestive) heart failure (HCC)    Colon cancer ([place]) 2008   Diabetes mellitus ([place])    Dyspnea    Gout 09/08/2018   Heart attack ([place])    Heart murmur    Hypertension    Hypothyroidism    Macrocytic anemia    MDS (myelodysplastic syndrome) (HCC)    Moderate mitral stenosis    PAD (peripheral artery disease) (HCC)    PAF (paroxysmal atrial fibrillation) (HCC)    Pulmonary hypertension (HCC)    Renal artery stenosis (HCC)    a. >60% R renal artery stenosis by duplex 2021, managed medically.   Severe aortic stenosis    Stage 4 chronic kidney disease (HCC)    Transfusion-dependent anemia     Past Surgical History:  Procedure Laterality Date   AV FISTULA PLACEMENT Right 09/26/2020   Procedure: RIGHT ARM ARTERIOVENOUS (AV) FISTULA CREATION;  Surgeon: Waynetta Sandy, MD;  Location: [place];  Service:  Vascular;  Laterality: Right;  Monitor Anesthesia Care with Regional Block to Right Arm    [place] Right 12/13/2020   Procedure: RIGHT UPPER EXTREMITY ARTERIOVENOUS FISTULA TRANSPOSITION;  Surgeon: Waynetta Sandy, MD;  Location: [place];  Service: Vascular;  Laterality: Right;   COLON SURGERY     DEBRIDMENT OF DECUBITUS ULCER N/A 06/12/2018   Procedure: DEBRIDMENT OF DECUBITUS ULCER;  Surgeon: Wallace Going, DO;  [facility];  Service: Plastics;  Laterality: N/A;   ESOPHAGOGASTRODUODENOSCOPY N/A 09/01/2020   Procedure: ESOPHAGOGASTRODUODENOSCOPY (EGD);  Surgeon: Clarene Essex, MD;  Location: [facility];  Service: Endoscopy;  Laterality: N/A;   GIVENS CAPSULE STUDY N/A 09/01/2020   Procedure: GIVENS CAPSULE STUDY;  Surgeon: Clarene Essex, MD;  [facility];  Service: Endoscopy;  Laterality: N/A;   IR FLUORO GUIDE CV LINE RIGHT  10/11/2020   IR FLUORO GUIDE PORT INSERTION RIGHT  04/02/2017   IR REMOVAL TUN ACCESS W/ PORT W/O FL MOD SED  03/16/2019   IR US GUIDE VASC ACCESS RIGHT  04/02/2017   IR US GUIDE VASC ACCESS RIGHT  10/11/2020   LIGATION OF COMPETING BRANCHES OF ARTERIOVENOUS FISTULA Right 12/13/2020   Procedure: LIGATION OF COMPETING BRANCHES OF RIGHT UPPER EXTREMITY ARTERIOVENOUS FISTULA;  Surgeon: Waynetta Sandy, MD;  Location: [facility];  Service: Vascular;  Laterality: Right;   RIGHT HEART CATH N/A 04/23/2019   Procedure:  RIGHT HEART CATH;  Surgeon: Jolaine Artist, MD;  Location: [place] [facility];  Service: Cardiovascular;  Laterality: N/A;   RIGHT/LEFT HEART CATH AND CORONARY ANGIOGRAPHY N/A 05/21/2018   Procedure: RIGHT/LEFT HEART CATH AND CORONARY ANGIOGRAPHY;  Surgeon: Nelva Bush, MD;  Location: [place] [facility];  Service: Cardiovascular;  Laterality: N/A;   RIGHT/LEFT HEART CATH AND CORONARY ANGIOGRAPHY N/A 10/13/2020   Procedure: RIGHT/LEFT HEART CATH AND CORONARY ANGIOGRAPHY;  Surgeon: Jolaine Artist, MD;  Location: [place];  Service: Cardiovascular;  Laterality: N/A;   TUBAL LIGATION       reports that she quit smoking about 25 years ago. Her smoking use included cigarettes. She has a 5.00 pack-year smoking history. She has quit using smokeless tobacco. She reports current alcohol use of about 1.0 standard drink of alcohol per week. She reports that she does not use drugs.  Allergies  Allergen Reactions   Ancef [Cefazolin] Itching    Severe itching- after procedure, ancef was the antibiotic.-04/02/17 Tolerates penicillins   Lactose Intolerance (Gi) Diarrhea   Morphine And Related Itching   Sulfa Antibiotics Rash and Other (See Comments)    Blisters, also    Family History  Problem Relation Age of Onset   Hypertension Mother    Diabetes Mother    Cervical cancer Mother    Heart attack Father    Hypertension Sister    Hypertension Brother    Hypertension Sister    Hypertension Sister    Prostate cancer Brother    HIV/AIDS Brother     Prior to Admission medications   Medication Sig Start Date End Date Taking? Authorizing Provider  allopurinol (ZYLOPRIM) 300 MG tablet Take 300 mg by mouth daily. 07/05/20  Yes [provider]  atorvastatin (LIPITOR) 40 MG tablet Take 1 tablet by mouth once daily Patient taking differently: Take 40 mg by mouth daily. 11/13/20  Yes [name], FNP  carvedilol (COREG) 3.125 MG tablet Take 1 tablet (3.125 mg total) by mouth 2 (two) times daily. 11/27/20  Yes Clegg, Amy D, NP  cholecalciferol (VITAMIN D3) 25 MCG (1000 UNIT) tablet Take 1,000 Units by mouth daily.   Yes [provider]  cloNIDine (CATAPRES) 0.1 MG tablet Take 1 tablet (0.1 mg total) by mouth 2 (two) times daily. 07/03/20  Yes Bensimhon, Shaune Pascal, MD  EUTHYROX 150 MCG tablet TAKE 1 TABLET BY MOUTH ONCE DAILY BEFORE BREAKFAST Patient taking differently: Take 150 mcg by mouth daily before breakfast. 02/12/21  Yes [name], FNP  hydrALAZINE (APRESOLINE) 100 MG tablet Take 1  tablet (100 mg total) by mouth 3 (three) times daily. 09/27/20  Yes Lorretta Harp, MD  Insulin Glargine [facility]) 100 UNIT/ML Inject 15 Units into the skin daily as needed (only if blood sugar over 150). Only if above 150   Yes [provider]  latanoprost (XALATAN) 0.005 % ophthalmic solution Place 1 drop into both eyes at bedtime.    Yes [provider]  lidocaine-prilocaine (EMLA) cream Apply 1 application topically daily as needed (port access). 01/23/21  Yes [provider]  linagliptin (TRADJENTA) 5 MG TABS tablet Take 1 tablet (5 mg total) by mouth daily. 12/12/20  Yes [name], FNP  Multiple Vitamins-Minerals (CENTRUM SILVER 50+WOMEN) TABS Take 1 tablet by mouth daily.   Yes [provider]  polyethylene glycol (MIRALAX / GLYCOLAX) 17 g packet Take 17 g by mouth daily as needed for moderate constipation. 01/14/21  Yes Geradine Girt, DO  sildenafil (REVATIO) 20 MG tablet TAKE 1 TABLET BY MOUTH THREE TIMES DAILY Patient taking differently: Take 20 mg by mouth 3 (three) times daily. 02/05/21  Yes Sherren Mocha, MD  amiodarone (PACERONE) 200 MG tablet Take 1 tablet (200 mg total) by mouth daily. Patient not taking: Reported on 02/20/2021 07/19/20   Bensimhon, Shaune Pascal, MD  blood glucose meter kit and supplies Dispense based on patient and insurance preference. Use up to four times daily as directed. (FOR ICD-10 E10.9, E11.9). 11/04/18   [name], FNP  Blood Glucose Monitoring Suppl [facility]) w/Device KIT Check blood sugars twice daily E11.9 11/30/19   [name], FNP  glucose blood test strip check blood sugars twice daily Dx code E11.22 03/02/20   [name], FNP  Insulin Pen Needle 29G X 5MM MISC Use as directed 06/16/17   Bonnielee Haff, MD  Lancets (ONETOUCH DELICA PLUS [id number]) [place] check blood sugars twice daily Dx code: E11.22 03/02/20   [name], FNP    Physical Exam: Constitutional: Moderately built and  nourished. Vitals:   02/20/21 1852 02/20/21 1916 02/20/21 1923 02/20/21 1924  BP: 100/74 (!) 150/45  (!) 161/49  Pulse: 82 82  82  Resp: _0 Temp:   98.3 F (36.8 C) 98.2 F (36.8 C)  TempSrc: Oral     SpO2:  100%  99%   Eyes: Anicteric no pallor. ENMT: No discharge from the ears eyes nose and mouth. Neck: No mass felt.  No neck rigidity. Respiratory: No rhonchi or crepitations. Cardiovascular: S1-S2 heard. Abdomen: Soft nontender bowel sound present. Musculoskeletal: No edema. Skin: No rash. Neurologic: Alert awake oriented to time place and person.  Moves all extremities. Psychiatric: Appears normal.  Normal affect.   Labs on Admission: I have personally reviewed following labs and imaging studies  CBC: Recent Labs  Lab 02/20/21 1308 02/20/21 1532  WBC 4.7 5.2  NEUTROABS 3.4 3.6  HGB 5.9* 5.0*  HCT 17.2* 15.3*  MCV 87.8 89.5  PLT 171 003   Basic Metabolic Panel: Recent Labs  Lab 02/20/21 1532  NA 139  K 3.1*  CL 96*  CO2 33*  GLUCOSE 136*  BUN 30*  CREATININE 2.50*  CALCIUM 8.5*   GFR: CrCl cannot be calculated (Unknown ideal weight.). Liver Function Tests: No results for input(s): AST, ALT, ALKPHOS, BILITOT, PROT, ALBUMIN in the last 168 hours. No results for input(s): LIPASE, AMYLASE in the last 168 hours. No results for input(s): AMMONIA in the last 168 hours. Coagulation Profile: Recent Labs  Lab 02/20/21 1532  INR 1.0   Cardiac Enzymes: No results for input(s): CKTOTAL, CKMB, CKMBINDEX, TROPONINI in the last 168 hours. BNP (last 3 results) No results for input(s): PROBNP in the last 8760 hours. HbA1C: No results for input(s): HGBA1C in the last 72 hours. CBG: No results for input(s): GLUCAP in the last 168 hours. Lipid Profile: No results for input(s): CHOL, HDL, LDLCALC, TRIG, CHOLHDL, LDLDIRECT in the last 72 hours. Thyroid Function Tests: No results for input(s): TSH, T4TOTAL, FREET4, T3FREE, THYROIDAB in the last 72  hours. Anemia Panel: No results for input(s): VITAMINB12, FOLATE, FERRITIN, TIBC, IRON, RETICCTPCT in the last 72 hours. Urine analysis:    Component Value Date/Time   COLORURINE AMBER (A) 10/09/2020 2320   APPEARANCEUR CLOUDY (A) 10/09/2020 2320   LABSPEC 1.016 10/09/2020 2320   PHURINE 5.0 10/09/2020 2320   GLUCOSEU NEGATIVE 10/09/2020 2320   HGBUR SMALL (A) 10/09/2020 2320   BILIRUBINUR NEGATIVE 10/09/2020 2320  BILIRUBINUR negative 05/11/2020 1152   KETONESUR NEGATIVE 10/09/2020 2320   PROTEINUR 100 (A) 10/09/2020 2320   UROBILINOGEN 0.2 05/11/2020 1152   NITRITE NEGATIVE 10/09/2020 2320   LEUKOCYTESUR MODERATE (A) 10/09/2020 2320   Sepsis Labs: _0 (procalcitonin:4,lacticidven:4) )No results found for this or any previous visit (from the past 240 hour(s)).   Radiological Exams on Admission: No results found.  EKG: Independently reviewed.  Normal sinus rhythm with nonspecific ST changes.  Assessment/Plan Principal Problem:   Symptomatic anemia Active Problems:   MDS (myelodysplastic syndrome) (HCC)   Essential hypertension   PAF (paroxysmal atrial fibrillation) (HCC)   Hypothyroidism   ESRD (end stage renal disease) (HCC)    Symptomatic anemia likely from MDS and also could be from chronic blood loss from possible GI bleed for which 2 units of PRBC transfusion has been ordered follow CBC. Stool for occult blood is positive with patient as having EGD and capsule endoscopy in February 2022 of this year which was not showing any source of bleed.  Since patient has precipitous drop in hemoglobin we will consult [facility] with whom patient has previously followed to see if they want to do any procedure. ESRD on hemodialysis on Tuesday Thursday and Saturday.  Will consult nephrology for dialysis needs. Diabetes mellitus type 2 we will keep patient on sliding scale coverage. Hypertension we will continue home medications including Coreg hydralazine and  clonidine. History of paroxysmal atrial fibrillation.  Currently in sinus rhythm.  Not on anticoagulation. History of gout on allopurinol. History of MDS being followed by oncologist.   DVT prophylaxis: SCDs.  Avoiding anticoagulation since patient has GI bleed. Code Status: Full code. Family Communication: Discussed with patient. Disposition Plan: Home. Consults called: We will consult [facility].  Will need to consult nephrology for dialysis. Admission status: Observation.   Rise Patience MD [facility] Pager [phone].  If 7PM-7AM, please contact night-coverage [email or web address] Password [facility]  02/20/2021, 7:49 PM

## 2021-02-21 ENCOUNTER — Other Ambulatory Visit: Payer: Medicare Other

## 2021-02-21 ENCOUNTER — Inpatient Hospital Stay: Payer: Medicare Other

## 2021-02-21 DIAGNOSIS — D61818 Other pancytopenia: Secondary | ICD-10-CM | POA: Diagnosis not present

## 2021-02-21 DIAGNOSIS — D469 Myelodysplastic syndrome, unspecified: Secondary | ICD-10-CM | POA: Diagnosis not present

## 2021-02-21 DIAGNOSIS — D62 Acute posthemorrhagic anemia: Secondary | ICD-10-CM | POA: Diagnosis present

## 2021-02-21 DIAGNOSIS — Z882 Allergy status to sulfonamides status: Secondary | ICD-10-CM | POA: Diagnosis not present

## 2021-02-21 DIAGNOSIS — I701 Atherosclerosis of renal artery: Secondary | ICD-10-CM | POA: Diagnosis not present

## 2021-02-21 DIAGNOSIS — E039 Hypothyroidism, unspecified: Secondary | ICD-10-CM | POA: Diagnosis not present

## 2021-02-21 DIAGNOSIS — E1122 Type 2 diabetes mellitus with diabetic chronic kidney disease: Secondary | ICD-10-CM | POA: Diagnosis not present

## 2021-02-21 DIAGNOSIS — I132 Hypertensive heart and chronic kidney disease with heart failure and with stage 5 chronic kidney disease, or end stage renal disease: Secondary | ICD-10-CM | POA: Diagnosis not present

## 2021-02-21 DIAGNOSIS — I480 Paroxysmal atrial fibrillation: Secondary | ICD-10-CM | POA: Diagnosis not present

## 2021-02-21 DIAGNOSIS — N186 End stage renal disease: Secondary | ICD-10-CM | POA: Diagnosis not present

## 2021-02-21 DIAGNOSIS — Z888 Allergy status to other drugs, medicaments and biological substances status: Secondary | ICD-10-CM | POA: Diagnosis not present

## 2021-02-21 DIAGNOSIS — Z66 Do not resuscitate: Secondary | ICD-10-CM | POA: Diagnosis not present

## 2021-02-21 DIAGNOSIS — D649 Anemia, unspecified: Secondary | ICD-10-CM | POA: Diagnosis not present

## 2021-02-21 DIAGNOSIS — Z85038 Personal history of other malignant neoplasm of large intestine: Secondary | ICD-10-CM | POA: Diagnosis not present

## 2021-02-21 DIAGNOSIS — Z20822 Contact with and (suspected) exposure to covid-19: Secondary | ICD-10-CM | POA: Diagnosis not present

## 2021-02-21 DIAGNOSIS — Z885 Allergy status to narcotic agent status: Secondary | ICD-10-CM | POA: Diagnosis not present

## 2021-02-21 DIAGNOSIS — R0789 Other chest pain: Secondary | ICD-10-CM | POA: Diagnosis not present

## 2021-02-21 DIAGNOSIS — I252 Old myocardial infarction: Secondary | ICD-10-CM | POA: Diagnosis not present

## 2021-02-21 DIAGNOSIS — I272 Pulmonary hypertension, unspecified: Secondary | ICD-10-CM | POA: Diagnosis not present

## 2021-02-21 DIAGNOSIS — Z992 Dependence on renal dialysis: Secondary | ICD-10-CM | POA: Diagnosis not present

## 2021-02-21 DIAGNOSIS — K5521 Angiodysplasia of colon with hemorrhage: Secondary | ICD-10-CM | POA: Diagnosis not present

## 2021-02-21 DIAGNOSIS — E1151 Type 2 diabetes mellitus with diabetic peripheral angiopathy without gangrene: Secondary | ICD-10-CM | POA: Diagnosis not present

## 2021-02-21 DIAGNOSIS — I080 Rheumatic disorders of both mitral and aortic valves: Secondary | ICD-10-CM | POA: Diagnosis not present

## 2021-02-21 DIAGNOSIS — Z87891 Personal history of nicotine dependence: Secondary | ICD-10-CM | POA: Diagnosis not present

## 2021-02-21 DIAGNOSIS — I5032 Chronic diastolic (congestive) heart failure: Secondary | ICD-10-CM | POA: Diagnosis not present

## 2021-02-21 LAB — CBC
HCT: 22.3 % — ABNORMAL LOW (ref 36.0–46.0)
Hemoglobin: 7.6 g/dL — ABNORMAL LOW (ref 12.0–15.0)
MCH: 30.2 pg (ref 26.0–34.0)
MCHC: 34.1 g/dL (ref 30.0–36.0)
MCV: 88.5 fL (ref 80.0–100.0)
Platelets: 162 10*3/uL (ref 150–400)
RBC: 2.52 MIL/uL — ABNORMAL LOW (ref 3.87–5.11)
RDW: 15.8 % — ABNORMAL HIGH (ref 11.5–15.5)
WBC: 5.3 10*3/uL (ref 4.0–10.5)
nRBC: 3 % — ABNORMAL HIGH (ref 0.0–0.2)

## 2021-02-21 LAB — GLUCOSE, CAPILLARY
Glucose-Capillary: 231 mg/dL — ABNORMAL HIGH (ref 70–99)
Glucose-Capillary: 131 mg/dL — ABNORMAL HIGH (ref 70–99)

## 2021-02-21 LAB — BASIC METABOLIC PANEL
Anion gap: 8 (ref 5–15)
BUN: 49 mg/dL — ABNORMAL HIGH (ref 8–23)
CO2: 33 mmol/L — ABNORMAL HIGH (ref 22–32)
Calcium: 9.1 mg/dL (ref 8.9–10.3)
Chloride: 96 mmol/L — ABNORMAL LOW (ref 98–111)
Creatinine, Ser: 3.31 mg/dL — ABNORMAL HIGH (ref 0.44–1.00)
GFR, Estimated: 15 mL/min — ABNORMAL LOW (ref >60)
Glucose, Bld: 155 mg/dL — ABNORMAL HIGH (ref 70–99)
Potassium: 3.5 mmol/L (ref 3.5–5.1)
Sodium: 137 mmol/L (ref 135–145)

## 2021-02-21 LAB — CBG MONITORING, ED
Glucose-Capillary: 150 mg/dL — ABNORMAL HIGH (ref 70–99)
Glucose-Capillary: 146 mg/dL — ABNORMAL HIGH (ref 70–99)

## 2021-02-21 LAB — MRSA NEXT GEN BY PCR, NASAL: MRSA by PCR Next Gen: NOT DETECTED

## 2021-02-21 MED ORDER — CHLORHEXIDINE GLUCONATE CLOTH 2 % EX PADS
6.0000 | MEDICATED_PAD | Freq: Every day | CUTANEOUS | Status: DC
  Administered 2021-02-21 – 2021-02-23 (×2): 6 via TOPICAL

## 2021-02-21 MED ORDER — PEG 3350-KCL-NA BICARB-NACL 420 G PO SOLR
4000.0000 mL | Freq: Once | ORAL | Status: AC
  Administered 2021-02-21: 4000 mL via ORAL
  Filled 2021-02-21 (×2): qty 4000

## 2021-02-21 MED ORDER — DIPHENHYDRAMINE HCL 25 MG PO CAPS
25.0000 mg | ORAL_CAPSULE | Freq: Once | ORAL | Status: AC
  Administered 2021-02-21: 25 mg via ORAL
  Filled 2021-02-21: qty 1

## 2021-02-21 MED ORDER — MELATONIN 3 MG PO TABS
3.0000 mg | ORAL_TABLET | Freq: Every evening | ORAL | Status: DC | PRN
  Administered 2021-02-21 – 2021-02-22 (×2): 3 mg via ORAL
  Filled 2021-02-21 (×2): qty 1

## 2021-02-21 NOTE — Progress Notes (Addendum)
[facility] PROGRESS NOTE    Progress Note  Patricia Mata  VXB:[id number] DOB: 04-15-1956 DOA: 02/20/2021 PCP: [name], FNP     Brief Narrative:   Patricia Mata is an 65 y.o. female past medical history of end-stage renal disease on hemodialysis also history of stage III colon cancer diagnosed in 2008, status post a hemicolectomy on 11/2006 lymph nodes positive for which she completed her chemo treatment, MDS requiring transfusion every 3 weeks aortic stenosis diabetes mellitus type 2 comes into the ED she was found to have a hemoglobin low.  The patient relates some black stools on and off denies taking NSAIDs.  High sensitive troponins basically flat EKG showed no changes.  Hemoglobin on admission was 5 she was transfused 2 unit of packed red blood cells her hemoglobin came up to 7.6.    Assessment/Plan:   Symptomatic anemia/ MDS (myelodysplastic syndrome) for which she requires transfusions every 2 weeks: Status post 2 units packed red blood cells. FOBT was positive, she recently had a capsule endoscopy and EGD in February 2022 that did not show any source of bleeding. [facility] has been consulted.  End-stage renal disease on hemodialysis Tuesday Thursdays and Saturdays: Nephrology has been notified.  Diabetes mellitus type 2: Continue sliding scale insulin, blood glucose fairly controlled she is currently NPO.  Essential hypertension: Continue Coreg hydralazine and clonidine.  History of paroxysmal A. fib: In sinus rhythm with no anticoagulation.  History of gout: Continue metoprolol.    DVT prophylaxis: SCD Family Communication:none Status is: Observation  The patient remains OBS appropriate and will d/c before 2 midnights.  Dispo: The patient is from: Home              Anticipated d/c is to: Home              Patient currently is not medically stable to d/c.   Difficult to place patient No   Code Status:     Code Status Orders  (From  admission, onward)           Start     Ordered   02/20/21 1948  Full code  Continuous        02/20/21 1948           Code Status History     Date Active Date Inactive Code Status Order ID Comments User Context   01/13/2021 1253 01/14/2021 1929 Full Code [id number]  Lequita Halt, MD ED   11/21/2020 1457 11/23/2020 1510 Full Code [id number]  Barton Dubois, MD ED   10/09/2020 1410 10/16/2020 1701 Full Code [id number]  Lequita Halt, MD ED   08/31/2020 1809 09/02/2020 1859 Full Code [id number]  Jonnie Finner, DO ED   07/06/2020 2020 07/09/2020 1827 Full Code [id number]  Lenore Cordia, MD ED   06/07/2020 1113 06/11/2020 2045 Full Code [id number]  Karmen Bongo, MD ED   09/21/2019 0017 10/03/2019 1719 Full Code [id number]  Toy Baker, MD Inpatient   09/14/2019 1214 09/17/2019 1743 Full Code [id number]  Roney Jaffe, MD ED   04/23/2019 1258 04/23/2019 1707 Full Code [id number]  Bensimhon, Shaune Pascal, MD Inpatient   03/18/2019 2105 03/23/2019 1711 Full Code [id number]  Mercy Riding, MD Inpatient   06/09/2018 0401 06/15/2018 1747 DNR [id number]  Toy Baker, MD ED   05/23/2018 1253 05/26/2018 2355 DNR [id number]  Irean Hong, NP Inpatient   05/11/2018 1109 05/23/2018 1253 Full Code [id number]  Marijean Heath, NP ED   06/14/2017 1549  06/16/2017 1645 Full Code [id number]  Lady Deutscher, MD Inpatient         IV Access:   Peripheral IV   Procedures and diagnostic studies:   DG CHEST PORT 1 VIEW  Result Date: 02/20/2021 CLINICAL DATA:  Chest pain EXAM: PORTABLE CHEST 1 VIEW COMPARISON:  01/13/2021 FINDINGS: Cardiac shadow is mildly prominent but accentuated by the portable technique. Dialysis catheter is noted in satisfactory position. Aortic calcifications are noted. Patient is rotated to the right accentuating the mediastinal markings. No focal infiltrate or effusion is seen. IMPRESSION: No acute abnormality noted. Electronically Signed   By: Inez Catalina M.D.   On: 02/20/2021  23:53     Medical Consultants:   None.   Subjective:    Patricia Mata no complaints she relates she has had black stools for several days  Objective:    Vitals:   02/21/21 0510 02/21/21 0600 02/21/21 0630 02/21/21 0700  BP: (!) 154/57 (!) 161/54 (!) 167/56 (!) 153/42  Pulse: 76 74 71 69  Resp: 10 15 17    Temp:  98 F (36.7 C)    TempSrc:  Oral    SpO2: 95% 100% 100% 100%   SpO2: 100 %   Intake/Output Summary (Last 24 hours) at 02/21/2021 0844 Last data filed at 02/21/2021 0347 Gross per 24 hour  Intake 2106.91 ml  Output --  Net 2106.91 ml   There were no vitals filed for this visit.  Exam: General exam: In no acute distress. Respiratory system: Good air movement and clear to auscultation. Cardiovascular system: S1 & S2 heard, RRR. No JVD. Gastrointestinal system: Abdomen is nondistended, soft and nontender.  Extremities: No pedal edema. Skin: No rashes, lesions or ulcers Data Reviewed:    Labs: Basic Metabolic Panel: Recent Labs  Lab 02/20/21 1532 02/21/21 0645  NA 139 137  K 3.1* 3.5  CL 96* 96*  CO2 33* 33*  GLUCOSE 136* 155*  BUN 30* 49*  CREATININE 2.50* 3.31*  CALCIUM 8.5* 9.1   GFR CrCl cannot be calculated (Unknown ideal weight.). Liver Function Tests: No results for input(s): AST, ALT, ALKPHOS, BILITOT, PROT, ALBUMIN in the last 168 hours. No results for input(s): LIPASE, AMYLASE in the last 168 hours. No results for input(s): AMMONIA in the last 168 hours. Coagulation profile Recent Labs  Lab 02/20/21 1532  INR 1.0   COVID-19 Labs  No results for input(s): DDIMER, FERRITIN, LDH, CRP in the last 72 hours.  Lab Results  Component Value Date   SARSCOV2NAA NEGATIVE 02/20/2021   SARSCOV2NAA NEGATIVE 01/13/2021   SARSCOV2NAA NEGATIVE 12/12/2020   [place] NEGATIVE 11/21/2020    CBC: Recent Labs  Lab 02/20/21 1308 02/20/21 1532 02/21/21 0645  WBC 4.7 5.2 5.3  NEUTROABS 3.4 3.6  --   HGB 5.9* 5.0* 7.6*  HCT  17.2* 15.3* 22.3*  MCV 87.8 89.5 88.5  PLT 171 184 162   Cardiac Enzymes: No results for input(s): CKTOTAL, CKMB, CKMBINDEX, TROPONINI in the last 168 hours. BNP (last 3 results) No results for input(s): PROBNP in the last 8760 hours. CBG: Recent Labs  Lab 02/20/21 2312 02/21/21 0757  GLUCAP 158* 150*   D-Dimer: No results for input(s): DDIMER in the last 72 hours. Hgb A1c: No results for input(s): HGBA1C in the last 72 hours. Lipid Profile: No results for input(s): CHOL, HDL, LDLCALC, TRIG, CHOLHDL, LDLDIRECT in the last 72 hours. Thyroid function studies: No results for input(s): TSH, T4TOTAL, T3FREE, THYROIDAB in the last 72 hours.  Invalid  input(s): FREET3 Anemia work up: No results for input(s): VITAMINB12, FOLATE, FERRITIN, TIBC, IRON, RETICCTPCT in the last 72 hours. Sepsis Labs: Recent Labs  Lab 02/20/21 1308 02/20/21 1532 02/21/21 0645  WBC 4.7 5.2 5.3   Microbiology Recent Results (from the past 240 hour(s))  Resp Panel by RT-PCR (Flu A&B, Covid) Nasopharyngeal Swab     Status: None   Collection Time: 02/20/21  7:22 PM   Specimen: Nasopharyngeal Swab; Nasopharyngeal(NP) swabs in vial transport medium  Result Value Ref Range Status   SARS Coronavirus 2 by RT PCR NEGATIVE NEGATIVE Final    Comment: (NOTE) SARS-CoV-2 target nucleic acids are NOT DETECTED.  The SARS-CoV-2 RNA is generally detectable in upper respiratory specimens during the acute phase of infection. The lowest concentration of SARS-CoV-2 viral copies this assay can detect is 138 copies/mL. A negative result does not preclude SARS-Cov-2 infection and should not be used as the sole basis for treatment or other patient management decisions. A negative result may occur with  improper specimen collection/handling, submission of specimen other than nasopharyngeal swab, presence of viral mutation(s) within the areas targeted by this assay, and inadequate number of viral copies(<138 copies/mL). A  negative result must be combined with clinical observations, patient history, and epidemiological information. The expected result is Negative.  Fact Sheet for Patients:  EntrepreneurPulse.com.au  Fact Sheet for Healthcare Providers:  IncredibleEmployment.be  This test is no t yet approved or cleared by the [place] FDA and  has been authorized for detection and/or diagnosis of SARS-CoV-2 by FDA under an Emergency Use Authorization (EUA). This EUA will remain  in effect (meaning this test can be used) for the duration of the COVID-19 declaration under Section 564(b)(1) of the Act, 21 U.S.C.section 360bbb-3(b)(1), unless the authorization is terminated  or revoked sooner.       Influenza A by PCR NEGATIVE NEGATIVE Final   Influenza B by PCR NEGATIVE NEGATIVE Final    Comment: (NOTE) The Xpert Xpress SARS-CoV-2/FLU/RSV plus assay is intended as an aid in the diagnosis of influenza from Nasopharyngeal swab specimens and should not be used as a sole basis for treatment. Nasal washings and aspirates are unacceptable for Xpert Xpress SARS-CoV-2/FLU/RSV testing.  Fact Sheet for Patients: EntrepreneurPulse.com.au  Fact Sheet for Healthcare Providers: IncredibleEmployment.be  This test is not yet approved or cleared by the [place] FDA and has been authorized for detection and/or diagnosis of SARS-CoV-2 by FDA under an Emergency Use Authorization (EUA). This EUA will remain in effect (meaning this test can be used) for the duration of the COVID-19 declaration under Section 564(b)(1) of the Act, 21 U.S.C. section 360bbb-3(b)(1), unless the authorization is terminated or revoked.  Performed at [facility], [place] [address], [place], [place] [address]      Medications:    allopurinol  300 mg Oral Daily   atorvastatin  40 mg Oral Daily   carvedilol  3.125 mg Oral BID   cloNIDine   0.1 mg Oral BID   hydrALAZINE  100 mg Oral TID   insulin aspart  0-6 Units Subcutaneous TID WC   latanoprost  1 drop Both Eyes QHS   levothyroxine  150 mcg Oral QAC breakfast   sildenafil  20 mg Oral TID   Continuous Infusions:    LOS: 0 days   Charlynne Cousins  [facility]  02/21/2021, 8:44 AM

## 2021-02-21 NOTE — Progress Notes (Signed)
New Admission Note:   Arrival Method: Stretcher  Mental Orientation: alert and Oriented Telemetry: Box 14  Assessment: Completed Skin: Intact skin tag on left foot close to third toe  IV: Left forearm Pain: 0/10  Tubes: none  Safety Measures: Safety Fall Prevention Plan has been given, discussed and signed Admission: Completed [facility] [place] Orientation: Patient has been orientated to the room, unit and staff.  Family: None   Orders have been reviewed and implemented. Will continue to monitor the patient. Call light has been placed within reach and bed alarm has been activated.   Blen Ransome RN [place] [facility] Phone: [phone]

## 2021-02-21 NOTE — Consult Note (Signed)
Referring Provider:  [place] Primary Care Physician:  [name], FNP Primary Gastroenterologist:  Dr. Michail Sermon  Reason for Consultation: Recurrent anemia, melena  HPI: Patricia Mata is a 65 y.o. female  history of MDS requiring chronic transfusions, colon cancer s/p right hemicolectomy in 2008, H-pylori positive gastritis (2017), CKD, HFpEF, and epistaxis presenting for consultation of anemia with hemoglobin of 5 and melena.   According to patient, she has been having intermittent black stool since last several months.  She denies seeing any bright blood in the stool.  Denies abdominal pain, diarrhea or constipation.  Denies nausea or vomiting.    She was admitted to the hospital in February 2022 for anemia.  She underwent EGD on September 01, 2020 which showed mild gastritis and abnormal mucosa in the duodenum (melanosis or iron stain )but no active bleeding.  Capsule endoscopy in February 2022 showed questionable abnormal proximal small bowel mucosa otherwise no active bleeding   She denies ASA, NSAID, or blood thinner use.   EGD 05/2016: acute gastritis, H pylori positive per biopsy   Colonoscopy 08/2019: one tubular adenoma, 2 hyperplastic polyps, repeat 3 years  Past Medical History:  Diagnosis Date   Acute renal failure (ARF) ([place]) 06/08/2018   TTHS- [place]   Carotid artery disease ([place])    Chronic diastolic (congestive) heart failure ([place])    Colon cancer ([place]) 2008   Diabetes mellitus ([place])    Dyspnea    Gout 09/08/2018   Heart attack ([place])    Heart murmur    Hypertension    Hypothyroidism    Macrocytic anemia    MDS (myelodysplastic syndrome) (HCC)    Moderate mitral stenosis    PAD (peripheral artery disease) (HCC)    PAF (paroxysmal atrial fibrillation) (HCC)    Pulmonary hypertension (HCC)    Renal artery stenosis (HCC)    a. >60% R renal artery stenosis by duplex 2021, managed medically.   Severe aortic stenosis    Stage 4 chronic kidney disease  (HCC)    Transfusion-dependent anemia     Past Surgical History:  Procedure Laterality Date   AV FISTULA PLACEMENT Right 09/26/2020   Procedure: RIGHT ARM ARTERIOVENOUS (AV) FISTULA CREATION;  Surgeon: Waynetta Sandy, MD;  Location: [place];  Service: Vascular;  Laterality: Right;  Monitor Anesthesia Care with Regional Block to Right Arm    [place] Right 12/13/2020   Procedure: RIGHT UPPER EXTREMITY ARTERIOVENOUS FISTULA TRANSPOSITION;  Surgeon: Waynetta Sandy, MD;  Location: [place];  Service: Vascular;  Laterality: Right;   COLON SURGERY     DEBRIDMENT OF DECUBITUS ULCER N/A 06/12/2018   Procedure: DEBRIDMENT OF DECUBITUS ULCER;  Surgeon: Wallace Going, DO;  Location: [facility];  Service: Plastics;  Laterality: N/A;   ESOPHAGOGASTRODUODENOSCOPY N/A 09/01/2020   Procedure: ESOPHAGOGASTRODUODENOSCOPY (EGD);  Surgeon: Clarene Essex, MD;  Location: [facility];  Service: Endoscopy;  Laterality: N/A;   GIVENS CAPSULE STUDY N/A 09/01/2020   Procedure: GIVENS CAPSULE STUDY;  Surgeon: Clarene Essex, MD;  Location: [facility];  Service: Endoscopy;  Laterality: N/A;   IR FLUORO GUIDE CV LINE RIGHT  10/11/2020   IR FLUORO GUIDE PORT INSERTION RIGHT  04/02/2017   IR REMOVAL TUN ACCESS W/ PORT W/O FL MOD SED  03/16/2019   IR US GUIDE VASC ACCESS RIGHT  04/02/2017   IR US GUIDE VASC ACCESS RIGHT  10/11/2020   LIGATION OF COMPETING BRANCHES OF ARTERIOVENOUS FISTULA Right 12/13/2020   Procedure: LIGATION OF COMPETING BRANCHES OF RIGHT  UPPER EXTREMITY ARTERIOVENOUS FISTULA;  Surgeon: Waynetta Sandy, MD;  Location: [place];  Service: Vascular;  Laterality: Right;   RIGHT HEART CATH N/A 04/23/2019   Procedure: RIGHT HEART CATH;  Surgeon: Jolaine Artist, MD;  Location: [facility];  Service: Cardiovascular;  Laterality: N/A;   RIGHT/LEFT HEART CATH AND CORONARY ANGIOGRAPHY N/A 05/21/2018   Procedure: RIGHT/LEFT HEART CATH AND CORONARY ANGIOGRAPHY;  Surgeon:  Nelva Bush, MD;  Location: [facility];  Service: Cardiovascular;  Laterality: N/A;   RIGHT/LEFT HEART CATH AND CORONARY ANGIOGRAPHY N/A 10/13/2020   Procedure: RIGHT/LEFT HEART CATH AND CORONARY ANGIOGRAPHY;  Surgeon: Jolaine Artist, MD;  Location: [facility];  Service: Cardiovascular;  Laterality: N/A;   TUBAL LIGATION      Prior to Admission medications   Medication Sig Start Date End Date Taking? Authorizing Provider  allopurinol (ZYLOPRIM) 300 MG tablet Take 300 mg by mouth daily. 07/05/20  Yes [provider]  atorvastatin (LIPITOR) 40 MG tablet Take 1 tablet by mouth once daily Patient taking differently: Take 40 mg by mouth daily. 11/13/20  Yes [name], FNP  carvedilol (COREG) 3.125 MG tablet Take 1 tablet (3.125 mg total) by mouth 2 (two) times daily. 11/27/20  Yes Clegg, Amy D, NP  cholecalciferol (VITAMIN D3) 25 MCG (1000 UNIT) tablet Take 1,000 Units by mouth daily.   Yes [provider]  cloNIDine (CATAPRES) 0.1 MG tablet Take 1 tablet (0.1 mg total) by mouth 2 (two) times daily. 07/03/20  Yes Bensimhon, Shaune Pascal, MD  EUTHYROX 150 MCG tablet TAKE 1 TABLET BY MOUTH ONCE DAILY BEFORE BREAKFAST Patient taking differently: Take 150 mcg by mouth daily before breakfast. 02/12/21  Yes [name], FNP  hydrALAZINE (APRESOLINE) 100 MG tablet Take 1 tablet (100 mg total) by mouth 3 (three) times daily. 09/27/20  Yes Lorretta Harp, MD  Insulin Glargine [facility]) 100 UNIT/ML Inject 15 Units into the skin daily as needed (only if blood sugar over 150). Only if above 150   Yes [provider]  latanoprost (XALATAN) 0.005 % ophthalmic solution Place 1 drop into both eyes at bedtime.    Yes [provider]  lidocaine-prilocaine (EMLA) cream Apply 1 application topically daily as needed (port access). 01/23/21  Yes [provider]  linagliptin (TRADJENTA) 5 MG TABS tablet Take 1 tablet (5 mg total) by mouth daily.  12/12/20  Yes [name], FNP  Multiple Vitamins-Minerals (CENTRUM SILVER 50+WOMEN) TABS Take 1 tablet by mouth daily.   Yes [provider]  polyethylene glycol (MIRALAX / GLYCOLAX) 17 g packet Take 17 g by mouth daily as needed for moderate constipation. 01/14/21  Yes Vann, Jessica U, DO  sildenafil (REVATIO) 20 MG tablet TAKE 1 TABLET BY MOUTH THREE TIMES DAILY Patient taking differently: Take 20 mg by mouth 3 (three) times daily. 02/05/21  Yes Sherren Mocha, MD  amiodarone (PACERONE) 200 MG tablet Take 1 tablet (200 mg total) by mouth daily. Patient not taking: Reported on 02/20/2021 07/19/20   Bensimhon, Shaune Pascal, MD  blood glucose meter kit and supplies Dispense based on patient and insurance preference. Use up to four times daily as directed. (FOR ICD-10 E10.9, E11.9). 11/04/18   [name], FNP  Blood Glucose Monitoring Suppl [facility] Karsten Fells) w/Device KIT Check blood sugars twice daily E11.9 11/30/19   [name], FNP  glucose blood test strip check blood sugars twice daily Dx code E11.22 03/02/20   [name], FNP  Insulin Pen Needle  29G X 5MM MISC Use as directed 06/16/17   Bonnielee Haff, MD  Lancets [facility] DELICA PLUS [id number]) [place] check blood sugars twice daily Dx code: E11.22 03/02/20   [name], FNP    Scheduled Meds:  allopurinol  300 mg Oral Daily   atorvastatin  40 mg Oral Daily   carvedilol  3.125 mg Oral BID   cloNIDine  0.1 mg Oral BID   hydrALAZINE  100 mg Oral TID   insulin aspart  0-6 Units Subcutaneous TID WC   latanoprost  1 drop Both Eyes QHS   levothyroxine  150 mcg Oral QAC breakfast   sildenafil  20 mg Oral TID   Continuous Infusions: PRN Meds:.acetaminophen **OR** acetaminophen  Allergies as of 02/20/2021 - Review Complete 02/20/2021  Allergen Reaction Noted   Ancef [cefazolin] Itching 04/02/2017   Lactose intolerance (gi) Diarrhea 09/27/2019   Morphine and related Itching 12/13/2020   Sulfa antibiotics Rash and Other (See  Comments) 02/01/2016    Family History  Problem Relation Age of Onset   Hypertension Mother    Diabetes Mother    Cervical cancer Mother    Heart attack Father    Hypertension Sister    Hypertension Brother    Hypertension Sister    Hypertension Sister    Prostate cancer Brother    HIV/AIDS Brother     Social History   Socioeconomic History   Marital status: Married    Spouse name: Not on file   Number of children: Not on file   Years of education: Not on file   Highest education level: Not on file  Occupational History   Occupation: retired  Tobacco Use   Smoking status: Former    Packs/day: 0.25    Years: 20.00    Pack years: 5.00    Types: Cigarettes    Quit date: 1997    Years since quitting: 25.6   Smokeless tobacco: Former  Scientific laboratory technician Use: Never used  Substance and Sexual Activity   Alcohol use: Yes    Alcohol/week: 1.0 standard drink    Types: 1 Standard drinks or equivalent per week    Comment: occ   Drug use: Never   Sexual activity: Not on file  Other Topics Concern   Not on file  Social History Narrative   Not on file   Social Determinants of Health   Financial Resource Strain: Low Risk    Difficulty of Paying Living Expenses: Not hard at all  Food Insecurity: No Food Insecurity   Worried About Charity fundraiser in the Last Year: Never true   [place] in the Last Year: Never true  Transportation Needs: No Transportation Needs   Lack of Transportation (Medical): No   Lack of Transportation (Non-Medical): No  Physical Activity: Inactive   Days of Exercise per Week: 0 days   Minutes of Exercise per Session: 0 min  Stress: No Stress Concern Present   Feeling of Stress : Not at all  Social Connections: Not on file  Intimate Partner Violence: Not on file    Review of Systems: All negative except as stated above in HPI.  Physical Exam: Vital signs: Vitals:   02/21/21 0730 02/21/21 0800  BP: (!) 146/52 (!) 147/40  Pulse:  71 65  Resp: 15 16  Temp:    SpO2: 100% 100%     General:   Alert,  Well-developed, well-nourished, pleasant and cooperative in NAD HEENT : Normocephalic, atraumatic, extraocular movement  intact.  No scleral icterus Lungs:  Clear throughout to auscultation.   No wheezes, crackles, or rhonchi. No acute distress. Heart:  Regular rate and rhythm; no murmurs, clicks, rubs,  or gallops. Abdomen: Soft, nontender, nondistended, bowel sounds present.  No peritoneal signs Neuro : Alert and oriented x3 Psych : Mood and affect normal Extremities : No lower extremity edema Rectal:  Deferred  GI:  Lab Results: Recent Labs    02/20/21 1308 02/20/21 1532 02/21/21 0645  WBC 4.7 5.2 5.3  HGB 5.9* 5.0* 7.6*  HCT 17.2* 15.3* 22.3*  PLT 171 184 162   BMET Recent Labs    02/20/21 1532 02/21/21 0645  NA 139 137  K 3.1* 3.5  CL 96* 96*  CO2 33* 33*  GLUCOSE 136* 155*  BUN 30* 49*  CREATININE 2.50* 3.31*  CALCIUM 8.5* 9.1   LFT No results for input(s): PROT, ALBUMIN, AST, ALT, ALKPHOS, BILITOT, BILIDIR, IBILI in the last 72 hours. PT/INR Recent Labs    02/20/21 1532  LABPROT 12.9  INR 1.0     Studies/Results: DG CHEST PORT 1 VIEW  Result Date: 02/20/2021 CLINICAL DATA:  Chest pain EXAM: PORTABLE CHEST 1 VIEW COMPARISON:  01/13/2021 FINDINGS: Cardiac shadow is mildly prominent but accentuated by the portable technique. Dialysis catheter is noted in satisfactory position. Aortic calcifications are noted. Patient is rotated to the right accentuating the mediastinal markings. No focal infiltrate or effusion is seen. IMPRESSION: No acute abnormality noted. Electronically Signed   By: Inez Catalina M.D.   On: 02/20/2021 23:53    Impression/Plan: -Symptomatic anemia with melena.  Hemoglobin was down to 5 yesterday.  Hemoglobin improved to 7.6 after blood transfusion. -History of MDS requiring multiple transfusions -End-stage renal disease on dialysis -History of colon cancer.   colonoscopy in February 2021 showed 1 tubular adenoma and repeat was recommended in 3 years.  Recommendations ----------------------- -Patient is willing to stay in hospital to have a further work-up done. -Plan for push enteroscopy and colonoscopy tomorrow. -she will be transferred to [facility] for her dialysis needs. -Liquid diet today.  N.p.o. past midnight.  -Discussed with hospitalist.  Risks (bleeding, infection, bowel perforation that could require surgery, sedation-related changes in cardiopulmonary systems), benefits (identification and possible treatment of source of symptoms, exclusion of certain causes of symptoms), and alternatives (watchful waiting, radiographic imaging studies, empiric medical treatment)  were explained to patient/family in detail and patient wishes to proceed.     LOS: 0 days   Otis Brace  MD, FACP 02/21/2021, 9:09 AM  Contact #  [phone]

## 2021-02-21 NOTE — ED Notes (Signed)
[facility] contacted to transport patient to 5M20.

## 2021-02-21 NOTE — Progress Notes (Signed)
Patricia Mata is a 65 Y/O female with ESRD on hemodialysis T,Th,S at [facility]. PMH: DMT2, HTN, severe AS, pulmonary HTN, refractory MDS followed by Dr. Alen Blew requiring q 2 weekly blood transfusions in addition to ESA given at OP HD units. H/O Stage III colon cancer S/P hemicolectomy 2008. She presented to [facility] ED with HGB 5. She is S/P 2 units PRBCs, feels much better. Repeat HGB 7.6 this AM. She has been admitted as observation patient for symptomatic anemia with melena. She has been seen by GI, plans for endoscopy and colonoscopy tomorrow. We have been asked to manage hemodialysis. We will consult formally if patient is upgraded to in-patient.   HD Orders: [facility] T,Th,S 4 hrs 180NRe 400/autoflow 1.5 2.0K/2.0 Ca AVF/TDC -Heparin 2000 units IV TIW -Mircera 225 mcg IV q 2 weeks (last dose 02/08/2021) -Calcitriol 0.25 mcg PO TIW   Patricia Mata [phone]

## 2021-02-21 NOTE — Patient Instructions (Signed)
Visit Information It was great speaking with you today!  Please let me know if you have any questions about our visit.   Goals Addressed             This Visit's Progress    Manage My Medicine       Timeframe:  Long-Range Goal Priority:  High Start Date:  09/06/2020                                             Follow Up Date 05/23/2021   In Process:   - call for medicine refill 2 or 3 days before it runs out - keep a list of all the medicines I take; vitamins and herbals too - use a pillbox to sort medicine    Why is this important?   These steps will help you keep on track with your medicines.           Patient Care Plan: CCM Pharmacy Care Plan     Problem Identified: HTN, DM   Priority: High     Long-Range Goal: Disease Management   Recent Progress: On track  Priority: High  Note:   Current Barriers:  Unable to independently monitor therapeutic efficacy  Pharmacist Clinical Goal(s):  Patient will achieve adherence to monitoring guidelines and medication adherence to achieve therapeutic efficacy through collaboration with PharmD and provider.   Interventions: 1:1 collaboration with [name], FNP regarding development and update of comprehensive plan of care as evidenced by provider attestation and co-signature Inter-disciplinary care team collaboration (see longitudinal plan of care) Comprehensive medication review performed; medication list updated in electronic medical record  Hypertension  (Status:Goal on track: YES.)     (BP goal <130/80) -Not ideally controlled -Current treatment: Clonidine 0.1 mg tablet twice daily Hydralazine 100 mg take 1 tablet by mouth three times daily Carvedilol 3.125 mg tablet twice per day Sildenafil 20 mg tablet three times per day  -Medications previously tried: Lexicographer,  Metoprolol Succinate 25 mg tablet  -Current home readings: 136/80, 143/74, 143/69, 137/72, 128/61, 127/66 -Current dietary habits: Her husband  using [place] 50% less salt -Current exercise habits: will discuss during next visit  -Denies hypotensive/hypertensive symptoms -Educated on BP goals and benefits of medications for prevention of heart attack, stroke and kidney damage; Importance of home blood pressure monitoring; Proper BP monitoring technique; -Counseled to monitor BP at home at least three times per week, document, and provide log at future appointments -Recommended to continue current medication  Diabetes (A1c goal <7%) -Controlled -Current medications: Insulin Glargine- inject 15 units as needed for BS greater than 150  Tradjenta 5 mg tablet once per day -Medications previously tried: Janumet XR 50-500 mg, Januvia 50 mg ,   -Current home glucose readings fasting glucose: 156,118,112,184, 98 -Denies hypoglycemic/hyperglycemic symptoms -Current meal patterns:  breakfast: a sandwich - egg and bacon sandwich, oatmeal, grits and eggs with meat  lunch: she does not eat lunch  because she has a late breakfast dinner: chicken, [place] hamburger and she is eating plenty of vegetables. Her husband has a garden, and they can their vegetables so everything is fresh.  snacks: she is not currently eating any snacks  drinks: patient reports drinking plenty of water  -Current exercise: will discuss during next office visit  -Educated on A1c and blood sugar goals; Prevention and management of hypoglycemic episodes; -Counseled to check feet  daily and get yearly eye exams -Recommended to continue current medication  Patient Goals/Self-Care Activities Patient will:  - take medications as prescribed  Follow Up Plan: The patient has been provided with contact information for the care management team and has been advised to call with any health related questions or concerns.       Patient agreed to services and verbal consent obtained.   The patient verbalized understanding of instructions, educational materials, and care plan  provided today and agreed to receive a mailed copy of patient instructions, educational materials, and care plan. Visit Information It was great speaking with you today!  Please let me know if you have any questions about our visit.   Goals Addressed             This Visit's Progress    Manage My Medicine       Timeframe:  Long-Range Goal Priority:  High Start Date:  09/06/2020                                             Follow Up Date 05/23/2021   In Process:   - call for medicine refill 2 or 3 days before it runs out - keep a list of all the medicines I take; vitamins and herbals too - use a pillbox to sort medicine    Why is this important?   These steps will help you keep on track with your medicines.           Patient Care Plan: CCM Pharmacy Care Plan     Problem Identified: HTN, DM   Priority: High     Long-Range Goal: Disease Management   Recent Progress: On track  Priority: High  Note:   Current Barriers:  Unable to independently monitor therapeutic efficacy  Pharmacist Clinical Goal(s):  Patient will achieve adherence to monitoring guidelines and medication adherence to achieve therapeutic efficacy through collaboration with PharmD and provider.   Interventions: 1:1 collaboration with [name], FNP regarding development and update of comprehensive plan of care as evidenced by provider attestation and co-signature Inter-disciplinary care team collaboration (see longitudinal plan of care) Comprehensive medication review performed; medication list updated in electronic medical record  Hypertension  (Status:Goal on track: YES.)     (BP goal <130/80) -Not ideally controlled -Current treatment: Clonidine 0.1 mg tablet twice daily Hydralazine 100 mg take 1 tablet by mouth three times daily Carvedilol 3.125 mg tablet twice per day Sildenafil 20 mg tablet three times per day  -Medications previously tried: Lexicographer,  Metoprolol Succinate 25 mg  tablet  -Current home readings: 136/80, 143/74, 143/69, 137/72, 128/61, 127/66 -Current dietary habits: Her husband using [place] 50% less salt -Current exercise habits: will discuss during next visit  -Denies hypotensive/hypertensive symptoms -Educated on BP goals and benefits of medications for prevention of heart attack, stroke and kidney damage; Importance of home blood pressure monitoring; Proper BP monitoring technique; -Counseled to monitor BP at home at least three times per week, document, and provide log at future appointments -Recommended to continue current medication  Diabetes (A1c goal <7%) -Controlled -Current medications: Insulin Glargine- inject 15 units as needed for BS greater than 150  Tradjenta 5 mg tablet once per day -Medications previously tried: Janumet XR 50-500 mg, Januvia 50 mg ,   -Current home glucose readings fasting glucose: 156,118,112,184, 98 -Denies hypoglycemic/hyperglycemic symptoms -Current meal patterns:  breakfast: a sandwich - egg and bacon sandwich, oatmeal, grits and eggs with meat  lunch: she does not eat lunch  because she has a late breakfast dinner: chicken, [place] hamburger and she is eating plenty of vegetables. Her husband has a garden, and they can their vegetables so everything is fresh.  snacks: she is not currently eating any snacks  drinks: patient reports drinking plenty of water  -Current exercise: will discuss during next office visit  -Educated on A1c and blood sugar goals; Prevention and management of hypoglycemic episodes; -Counseled to check feet daily and get yearly eye exams -Recommended to continue current medication  Patient Goals/Self-Care Activities Patient will:  - take medications as prescribed  Follow Up Plan: The patient has been provided with contact information for the care management team and has been advised to call with any health related questions or concerns.       Ms. Mitchum was given information  about Chronic Care Management services today including:  CCM service includes personalized support from designated clinical staff supervised by her physician, including individualized plan of care and coordination with other care providers 24/7 contact phone numbers for assistance for urgent and routine care needs. Standard insurance, coinsurance, copays and deductibles apply for chronic care management only during months in which we provide at least 20 minutes of these services. Most insurances cover these services at 100%, however patients may be responsible for any copay, coinsurance and/or deductible if applicable. This service may help you avoid the need for more expensive face-to-face services. Only one practitioner may furnish and bill the service in a calendar month. The patient may stop CCM services at any time (effective at the end of the month) by phone call to the office staff.  Patient agreed to services and verbal consent obtained.   The patient verbalized understanding of instructions, educational materials, and care plan provided today and agreed to receive a mailed copy of patient instructions, educational materials, and care plan.   Orlando Penner, PharmD Clinical Pharmacist [facility] [phone]    Orlando Penner, PharmD Clinical Pharmacist [facility] [phone]

## 2021-02-21 NOTE — Plan of Care (Signed)
  Problem: Health Behavior/Discharge Planning: Goal: Ability to manage health-related needs will improve Outcome: Progressing   

## 2021-02-21 NOTE — Plan of Care (Signed)
  Problem: Activity: Goal: Risk for activity intolerance will decrease Outcome: Progressing   

## 2021-02-22 ENCOUNTER — Encounter (HOSPITAL_COMMUNITY): Payer: Self-pay | Admitting: Internal Medicine

## 2021-02-22 ENCOUNTER — Encounter (HOSPITAL_COMMUNITY)
Admission: EM | Disposition: A | Discharge status: Home / Self Care | Payer: Self-pay | Source: Home / Self Care | Attending: Internal Medicine

## 2021-02-22 ENCOUNTER — Observation Stay (HOSPITAL_COMMUNITY): Payer: Medicare Other | Admitting: Anesthesiology

## 2021-02-22 DIAGNOSIS — D61818 Other pancytopenia: Secondary | ICD-10-CM | POA: Diagnosis present

## 2021-02-22 DIAGNOSIS — I272 Pulmonary hypertension, unspecified: Secondary | ICD-10-CM | POA: Diagnosis present

## 2021-02-22 DIAGNOSIS — E1151 Type 2 diabetes mellitus with diabetic peripheral angiopathy without gangrene: Secondary | ICD-10-CM | POA: Diagnosis present

## 2021-02-22 DIAGNOSIS — D62 Acute posthemorrhagic anemia: Secondary | ICD-10-CM | POA: Diagnosis present

## 2021-02-22 DIAGNOSIS — R0789 Other chest pain: Secondary | ICD-10-CM | POA: Diagnosis present

## 2021-02-22 DIAGNOSIS — Z66 Do not resuscitate: Secondary | ICD-10-CM | POA: Diagnosis present

## 2021-02-22 DIAGNOSIS — E039 Hypothyroidism, unspecified: Secondary | ICD-10-CM | POA: Diagnosis present

## 2021-02-22 DIAGNOSIS — I252 Old myocardial infarction: Secondary | ICD-10-CM | POA: Diagnosis not present

## 2021-02-22 DIAGNOSIS — I080 Rheumatic disorders of both mitral and aortic valves: Secondary | ICD-10-CM | POA: Diagnosis present

## 2021-02-22 DIAGNOSIS — I132 Hypertensive heart and chronic kidney disease with heart failure and with stage 5 chronic kidney disease, or end stage renal disease: Secondary | ICD-10-CM | POA: Diagnosis present

## 2021-02-22 DIAGNOSIS — Z885 Allergy status to narcotic agent status: Secondary | ICD-10-CM | POA: Diagnosis not present

## 2021-02-22 DIAGNOSIS — D469 Myelodysplastic syndrome, unspecified: Secondary | ICD-10-CM | POA: Diagnosis present

## 2021-02-22 DIAGNOSIS — Z85038 Personal history of other malignant neoplasm of large intestine: Secondary | ICD-10-CM | POA: Diagnosis not present

## 2021-02-22 DIAGNOSIS — Z888 Allergy status to other drugs, medicaments and biological substances status: Secondary | ICD-10-CM | POA: Diagnosis not present

## 2021-02-22 DIAGNOSIS — I5032 Chronic diastolic (congestive) heart failure: Secondary | ICD-10-CM | POA: Diagnosis present

## 2021-02-22 DIAGNOSIS — Z882 Allergy status to sulfonamides status: Secondary | ICD-10-CM | POA: Diagnosis not present

## 2021-02-22 DIAGNOSIS — N186 End stage renal disease: Secondary | ICD-10-CM | POA: Diagnosis present

## 2021-02-22 DIAGNOSIS — D649 Anemia, unspecified: Secondary | ICD-10-CM | POA: Diagnosis not present

## 2021-02-22 DIAGNOSIS — I480 Paroxysmal atrial fibrillation: Secondary | ICD-10-CM | POA: Diagnosis present

## 2021-02-22 DIAGNOSIS — E1122 Type 2 diabetes mellitus with diabetic chronic kidney disease: Secondary | ICD-10-CM | POA: Diagnosis present

## 2021-02-22 DIAGNOSIS — K5521 Angiodysplasia of colon with hemorrhage: Secondary | ICD-10-CM | POA: Diagnosis present

## 2021-02-22 DIAGNOSIS — Z20822 Contact with and (suspected) exposure to covid-19: Secondary | ICD-10-CM | POA: Diagnosis present

## 2021-02-22 DIAGNOSIS — I701 Atherosclerosis of renal artery: Secondary | ICD-10-CM | POA: Diagnosis present

## 2021-02-22 DIAGNOSIS — Z992 Dependence on renal dialysis: Secondary | ICD-10-CM | POA: Diagnosis not present

## 2021-02-22 DIAGNOSIS — Z87891 Personal history of nicotine dependence: Secondary | ICD-10-CM | POA: Diagnosis not present

## 2021-02-22 HISTORY — PX: COLONOSCOPY WITH PROPOFOL: SHX5780

## 2021-02-22 HISTORY — PX: POLYPECTOMY: SHX5525

## 2021-02-22 HISTORY — PX: HOT HEMOSTASIS: SHX5433

## 2021-02-22 HISTORY — PX: ENTEROSCOPY: SHX5533

## 2021-02-22 HISTORY — PX: HEMOSTASIS CLIP PLACEMENT: SHX6857

## 2021-02-22 LAB — TYPE AND SCREEN
ABO/RH(D): A NEG
Antibody Screen: NEGATIVE
Unit division: 0
Unit division: 0

## 2021-02-22 LAB — POCT I-STAT, CHEM 8
BUN: 50 mg/dL — ABNORMAL HIGH (ref 8–23)
Calcium, Ion: 1.16 mmol/L (ref 1.15–1.40)
Chloride: 96 mmol/L — ABNORMAL LOW (ref 98–111)
Creatinine, Ser: 4.1 mg/dL — ABNORMAL HIGH (ref 0.44–1.00)
Glucose, Bld: 111 mg/dL — ABNORMAL HIGH (ref 70–99)
HCT: 22 % — ABNORMAL LOW (ref 36.0–46.0)
Hemoglobin: 7.5 g/dL — ABNORMAL LOW (ref 12.0–15.0)
Potassium: 3.8 mmol/L (ref 3.5–5.1)
Sodium: 135 mmol/L (ref 135–145)
TCO2: 27 mmol/L (ref 22–32)

## 2021-02-22 LAB — CBC
HCT: 18.5 % — ABNORMAL LOW (ref 36.0–46.0)
HCT: 18.7 % — ABNORMAL LOW (ref 36.0–46.0)
HCT: 23 % — ABNORMAL LOW (ref 36.0–46.0)
Hemoglobin: 6.2 g/dL — CL (ref 12.0–15.0)
Hemoglobin: 6.3 g/dL — CL (ref 12.0–15.0)
Hemoglobin: 7.8 g/dL — ABNORMAL LOW (ref 12.0–15.0)
MCH: 29.8 pg (ref 26.0–34.0)
MCH: 30.4 pg (ref 26.0–34.0)
MCH: 30.2 pg (ref 26.0–34.0)
MCHC: 33.5 g/dL (ref 30.0–36.0)
MCHC: 33.7 g/dL (ref 30.0–36.0)
MCHC: 33.9 g/dL (ref 30.0–36.0)
MCV: 88.9 fL (ref 80.0–100.0)
MCV: 90.3 fL (ref 80.0–100.0)
MCV: 89.1 fL (ref 80.0–100.0)
Platelets: 147 10*3/uL — ABNORMAL LOW (ref 150–400)
Platelets: 148 10*3/uL — ABNORMAL LOW (ref 150–400)
Platelets: 155 10*3/uL (ref 150–400)
RBC: 2.08 MIL/uL — ABNORMAL LOW (ref 3.87–5.11)
RBC: 2.07 MIL/uL — ABNORMAL LOW (ref 3.87–5.11)
RBC: 2.58 MIL/uL — ABNORMAL LOW (ref 3.87–5.11)
RDW: 16.3 % — ABNORMAL HIGH (ref 11.5–15.5)
RDW: 16.5 % — ABNORMAL HIGH (ref 11.5–15.5)
RDW: 15.9 % — ABNORMAL HIGH (ref 11.5–15.5)
WBC: 3.8 10*3/uL — ABNORMAL LOW (ref 4.0–10.5)
WBC: 3.9 10*3/uL — ABNORMAL LOW (ref 4.0–10.5)
WBC: 5.1 10*3/uL (ref 4.0–10.5)
nRBC: 0.8 % — ABNORMAL HIGH (ref 0.0–0.2)
nRBC: 1.3 % — ABNORMAL HIGH (ref 0.0–0.2)
nRBC: 1.4 % — ABNORMAL HIGH (ref 0.0–0.2)

## 2021-02-22 LAB — BPAM RBC
Blood Product Expiration Date: 202208262359
Blood Product Expiration Date: 202208262359
ISSUE DATE / TIME: 202208091840
ISSUE DATE / TIME: 202208100206
Unit Type and Rh: 600
Unit Type and Rh: 600

## 2021-02-22 LAB — RENAL FUNCTION PANEL
Albumin: 2.8 g/dL — ABNORMAL LOW (ref 3.5–5.0)
Anion gap: 11 (ref 5–15)
BUN: 52 mg/dL — ABNORMAL HIGH (ref 8–23)
CO2: 29 mmol/L (ref 22–32)
Calcium: 9.5 mg/dL (ref 8.9–10.3)
Chloride: 95 mmol/L — ABNORMAL LOW (ref 98–111)
Creatinine, Ser: 4.08 mg/dL — ABNORMAL HIGH (ref 0.44–1.00)
GFR, Estimated: 11 mL/min — ABNORMAL LOW (ref >60)
Glucose, Bld: 114 mg/dL — ABNORMAL HIGH (ref 70–99)
Phosphorus: 4.4 mg/dL (ref 2.5–4.6)
Potassium: 3.6 mmol/L (ref 3.5–5.1)
Sodium: 135 mmol/L (ref 135–145)

## 2021-02-22 LAB — GLUCOSE, CAPILLARY
Glucose-Capillary: 135 mg/dL — ABNORMAL HIGH (ref 70–99)
Glucose-Capillary: 114 mg/dL — ABNORMAL HIGH (ref 70–99)
Glucose-Capillary: 102 mg/dL — ABNORMAL HIGH (ref 70–99)

## 2021-02-22 LAB — HEPATITIS B SURFACE ANTIGEN: Hepatitis B Surface Ag: NONREACTIVE

## 2021-02-22 LAB — PREPARE RBC (CROSSMATCH)

## 2021-02-22 SURGERY — ENTEROSCOPY
Anesthesia: Monitor Anesthesia Care

## 2021-02-22 MED ORDER — PROPOFOL 500 MG/50ML IV EMUL
INTRAVENOUS | Status: DC | PRN
  Administered 2021-02-22: 100 ug/kg/min via INTRAVENOUS

## 2021-02-22 MED ORDER — HEPARIN SODIUM (PORCINE) 1000 UNIT/ML IJ SOLN
INTRAMUSCULAR | Status: AC
  Administered 2021-02-22: 2000 [IU]
  Filled 2021-02-22: qty 2

## 2021-02-22 MED ORDER — DIPHENHYDRAMINE HCL 50 MG/ML IJ SOLN
INTRAMUSCULAR | Status: DC | PRN
  Administered 2021-02-22: 25 mg via INTRAVENOUS

## 2021-02-22 MED ORDER — SODIUM CHLORIDE 0.9% IV SOLUTION: Freq: Once | INTRAVENOUS | Status: DC

## 2021-02-22 MED ORDER — PROSOURCE PLUS PO LIQD
30.0000 mL | Freq: Two times a day (BID) | ORAL | Status: DC
  Administered 2021-02-23: 30 mL via ORAL
  Filled 2021-02-22: qty 30

## 2021-02-22 MED ORDER — ACETAMINOPHEN 10 MG/ML IV SOLN
INTRAVENOUS | Status: AC
  Filled 2021-02-22: qty 100

## 2021-02-22 MED ORDER — LIDOCAINE 2% (20 MG/ML) 5 ML SYRINGE
INTRAMUSCULAR | Status: DC | PRN
  Administered 2021-02-22: 50 mg via INTRAVENOUS

## 2021-02-22 MED ORDER — SODIUM CHLORIDE 0.9 % IV SOLN: INTRAVENOUS | Status: DC

## 2021-02-22 MED ORDER — NEPRO/CARBSTEADY PO LIQD
237.0000 mL | Freq: Two times a day (BID) | ORAL | Status: DC
  Administered 2021-02-23: 237 mL via ORAL

## 2021-02-22 MED ORDER — DIPHENHYDRAMINE HCL 25 MG PO CAPS: 50.0000 mg | ORAL_CAPSULE | Freq: Once | ORAL | Status: AC

## 2021-02-22 MED ORDER — RENA-VITE PO TABS
1.0000 | ORAL_TABLET | Freq: Every day | ORAL | Status: DC
  Administered 2021-02-22: 1 via ORAL
  Filled 2021-02-22: qty 1

## 2021-02-22 MED ORDER — PROPOFOL 10 MG/ML IV BOLUS
INTRAVENOUS | Status: DC | PRN
  Administered 2021-02-22 (×2): 20 mg via INTRAVENOUS

## 2021-02-22 MED ORDER — DIPHENHYDRAMINE HCL 25 MG PO CAPS
ORAL_CAPSULE | ORAL | Status: AC
  Administered 2021-02-22: 50 mg via ORAL
  Filled 2021-02-22: qty 2

## 2021-02-22 MED ORDER — ACETAMINOPHEN 500 MG PO TABS: 1000.0000 mg | ORAL_TABLET | Freq: Once | ORAL | Status: AC

## 2021-02-22 MED ORDER — ACETAMINOPHEN 500 MG PO TABS
ORAL_TABLET | ORAL | Status: AC
  Administered 2021-02-22: 1000 mg via ORAL
  Filled 2021-02-22: qty 2

## 2021-02-22 MED ORDER — DIPHENHYDRAMINE HCL 50 MG/ML IJ SOLN
INTRAMUSCULAR | Status: AC
  Filled 2021-02-22: qty 1

## 2021-02-22 SURGICAL SUPPLY — 22 items

## 2021-02-22 NOTE — Progress Notes (Signed)
PROGRESS NOTE    Patricia Mata  JOI:[id number] DOB: April 19, 1956 DOA: 02/20/2021 PCP: [name], FNP   Chief Complain: Black tarry stools, low hemoglobin.  Brief Narrative: Patient is a 65 year old female with history of ESRD on dialysis on TTS schedule, stage III colon cancer diagnosed in 2008 status post hemicolectomy currently in remission, MDS requiring blood transfusion, aortic stenosis, diabetes type 2 who presented to the emergency department after she was found to have low hemoglobin during the dialysis session.  She was also complaining of black tarry stools at home.  Denies taking any aspirin or NSAIDs.  Hemoglobin was found to be 5 on admission.  She was transfused with PRBCs.  GI consulted and planning for enteroscopy and colonoscopy today.  Nephrology following for dialysis.  Assessment & Plan:   Principal Problem:   Symptomatic anemia Active Problems:   MDS (myelodysplastic syndrome) (HCC)   Essential hypertension   PAF (paroxysmal atrial fibrillation) (HCC)   Hypothyroidism   ESRD (end stage renal disease) (HCC)   Symptomatic anemia/black tarry stools: Found to have hemoglobin of 5 on presentation.  Denies taking any NSAIDs or aspirin at home.  She also has history of MDS. She was having black tarry stools at home.  GI bleed suspected.  Plan for enteroscopy /colonoscopy today by GI. She has been transfused a total of 3 units so far.  Hemoglobin again in the range of 6 today. FOBT was positive.  Recently had a capsule endoscopy and EGD in February 2022 that did not show any source of bleeding.  ESRD on dialysis: On TTS schedule.  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 Alen Blew, on reblozyl 75 mg [place] injection every 3 weeks.  She gets regular blood transfusion.  Diabetes type 2: Monitor blood sugars.  Hypertension: Currently hypotensive.  Continue Coreg, hydralazine, clonidine.  Monitor blood pressure.  Continue as  needed medications for severe hypertension  History of paroxysmal A. fib: Not on anticoagulation.  On normal sinus rhythm at present.    History of aortic stenosis: We will recommend to follow-up with cardiology as an outpatient.  History of gout: Currently stable         DVT prophylaxis:SCD Code Status: Full Family Communication: None at bedside Status is: Inpatient    Dispo: The patient is from: Home              Anticipated d/c is to: Home              Patient currently is not medically stable to d/c.   Difficult to place patient No     Consultants: GI, nephrology  Procedures: Plan for enteroscopy/colonoscopy, dialysis  Antimicrobials:  Anti-infectives (From admission, onward)    None       Subjective: Patient seen and examined at the bedside this morning.  Hemodynamically stable.  Denies any complaints.  No nausea, vomiting or abdominal pain.  Her last bowel movement did not show any dark stool.  Awaiting procedures.  Objective: Vitals:   02/21/21 2103 02/22/21 0454 02/22/21 0855 02/22/21 0957  BP: (!) 172/49 (!) 158/47 (!) 160/51 (!) 201/37  Pulse: 70 74 71 73  Resp: 16 17 17 16   Temp: 98 F (36.7 C) 98.3 F (36.8 C) 98.3 F (36.8 C)   TempSrc: Oral Oral Oral   SpO2: 100% 100% 98% (!) 74%  Weight:      Height:        Intake/Output Summary (Last 24 hours) at 02/22/2021  [place] filed at 02/22/2021 1020 Gross per 24 hour  Intake 2435 ml  Output 4 ml  Net 2431 ml   Filed Weights   02/21/21 1344  Weight: 89.3 kg    Examination:  General exam: Overall comfortable, obese HEENT: PERRL Respiratory system:  no wheezes or crackles  Cardiovascular system: S1 & S2 heard, RRR.  Tunneled dialysis catheter on the right chest Gastrointestinal system: Abdomen is nondistended, soft and nontender. Central nervous system: Alert and oriented Extremities: No edema, no clubbing ,no cyanosis, AV fistula on the right forearm Skin: No rashes, no  ulcers,no icterus      Data Reviewed: I have personally reviewed following labs and imaging studies  CBC: Recent Labs  Lab 02/20/21 1308 02/20/21 1532 02/21/21 0645 02/22/21 0329 02/22/21 0635  WBC 4.7 5.2 5.3 3.8* 3.9*  NEUTROABS 3.4 3.6  --   --   --   HGB 5.9* 5.0* 7.6* 6.2* 6.3*  HCT 17.2* 15.3* 22.3* 18.5* 18.7*  MCV 87.8 89.5 88.5 88.9 90.3  PLT 171 184 162 147* 119*   Basic Metabolic Panel: Recent Labs  Lab 02/20/21 1532 02/21/21 0645 02/22/21 0635  NA 139 137 135  K 3.1* 3.5 3.6  CL 96* 96* 95*  CO2 33* 33* 29  GLUCOSE 136* 155* 114*  BUN 30* 49* 52*  CREATININE 2.50* 3.31* 4.08*  CALCIUM 8.5* 9.1 9.5  PHOS  --   --  4.4   GFR: Estimated Creatinine Clearance: 14.7 mL/min (A) (by C-G formula based on SCr of 4.08 mg/dL (H)). Liver Function Tests: Recent Labs  Lab 02/22/21 0635  ALBUMIN 2.8*   No results for input(s): LIPASE, AMYLASE in the last 168 hours. No results for input(s): AMMONIA in the last 168 hours. Coagulation Profile: Recent Labs  Lab 02/20/21 1532  INR 1.0   Cardiac Enzymes: No results for input(s): CKTOTAL, CKMB, CKMBINDEX, TROPONINI in the last 168 hours. BNP (last 3 results) No results for input(s): PROBNP in the last 8760 hours. HbA1C: No results for input(s): HGBA1C in the last 72 hours. CBG: Recent Labs  Lab 02/21/21 0757 02/21/21 1221 02/21/21 1334 02/21/21 1647 02/22/21 0651  GLUCAP 150* 146* 231* 131* 135*   Lipid Profile: No results for input(s): CHOL, HDL, LDLCALC, TRIG, CHOLHDL, LDLDIRECT in the last 72 hours. Thyroid Function Tests: No results for input(s): TSH, T4TOTAL, FREET4, T3FREE, THYROIDAB in the last 72 hours. Anemia Panel: No results for input(s): VITAMINB12, FOLATE, FERRITIN, TIBC, IRON, RETICCTPCT in the last 72 hours. Sepsis Labs: No results for input(s): PROCALCITON, LATICACIDVEN in the last 168 hours.  Recent Results (from the past 240 hour(s))  Resp Panel by RT-PCR (Flu A&B, Covid)  Nasopharyngeal Swab     Status: None   Collection Time: 02/20/21  7:22 PM   Specimen: Nasopharyngeal Swab; Nasopharyngeal(NP) swabs in vial transport medium  Result Value Ref Range Status   SARS Coronavirus 2 by RT PCR NEGATIVE NEGATIVE Final    Comment: (NOTE) SARS-CoV-2 target nucleic acids are NOT DETECTED.  The SARS-CoV-2 RNA is generally detectable in upper respiratory specimens during the acute phase of infection. The lowest concentration of SARS-CoV-2 viral copies this assay can detect is 138 copies/mL. A negative result does not preclude SARS-Cov-2 infection and should not be used as the sole basis for treatment or other patient management decisions. A negative result may occur with  improper specimen collection/handling, submission of specimen other than nasopharyngeal swab, presence of viral mutation(s) within the areas targeted by this assay, and  inadequate number of viral copies(<138 copies/mL). A negative result must be combined with clinical observations, patient history, and epidemiological information. The expected result is Negative.  Fact Sheet for Patients:  EntrepreneurPulse.com.au  Fact Sheet for Healthcare Providers:  IncredibleEmployment.be  This test is no t yet approved or cleared by the [place] FDA and  has been authorized for detection and/or diagnosis of SARS-CoV-2 by FDA under an Emergency Use Authorization (EUA). This EUA will remain  in effect (meaning this test can be used) for the duration of the COVID-19 declaration under Section 564(b)(1) of the Act, 21 U.S.C.section 360bbb-3(b)(1), unless the authorization is terminated  or revoked sooner.       Influenza A by PCR NEGATIVE NEGATIVE Final   Influenza B by PCR NEGATIVE NEGATIVE Final    Comment: (NOTE) The Xpert Xpress SARS-CoV-2/FLU/RSV plus assay is intended as an aid in the diagnosis of influenza from Nasopharyngeal swab specimens and should not be  used as a sole basis for treatment. Nasal washings and aspirates are unacceptable for Xpert Xpress SARS-CoV-2/FLU/RSV testing.  Fact Sheet for Patients: EntrepreneurPulse.com.au  Fact Sheet for Healthcare Providers: IncredibleEmployment.be  This test is not yet approved or cleared by the [place] FDA and has been authorized for detection and/or diagnosis of SARS-CoV-2 by FDA under an Emergency Use Authorization (EUA). This EUA will remain in effect (meaning this test can be used) for the duration of the COVID-19 declaration under Section 564(b)(1) of the Act, 21 U.S.C. section 360bbb-3(b)(1), unless the authorization is terminated or revoked.  Performed at [facility], [place] [address], [place], [place] [address]   MRSA Next Gen by PCR, Nasal     Status: None   Collection Time: 02/21/21  1:57 PM   Specimen: Nasal Mucosa; Nasal Swab  Result Value Ref Range Status   MRSA by PCR Next Gen NOT DETECTED NOT DETECTED Final    Comment: (NOTE) The GeneXpert MRSA Assay (FDA approved for NASAL specimens only), is one component of a comprehensive MRSA colonization surveillance program. It is not intended to diagnose MRSA infection nor to guide or monitor treatment for MRSA infections. Test performance is not FDA approved in patients less than 15 years old. Performed at [facility], [place] [address] [place], [place] [address]          Radiology Studies: DG CHEST PORT 1 VIEW  Result Date: 02/20/2021 CLINICAL DATA:  Chest pain EXAM: PORTABLE CHEST 1 VIEW COMPARISON:  01/13/2021 FINDINGS: Cardiac shadow is mildly prominent but accentuated by the portable technique. Dialysis catheter is noted in satisfactory position. Aortic calcifications are noted. Patient is rotated to the right accentuating the mediastinal markings. No focal infiltrate or effusion is seen. IMPRESSION: No acute abnormality noted. Electronically Signed   By:  Inez Catalina M.D.   On: 02/20/2021 23:53        Scheduled Meds:  [OVF Hold] sodium chloride   Intravenous Once   [MAR Hold] allopurinol  300 mg Oral Daily   [MAR Hold] atorvastatin  40 mg Oral Daily   [MAR Hold] carvedilol  3.125 mg Oral BID   [MAR Hold] Chlorhexidine Gluconate Cloth  6 each Topical Daily   [MAR Hold] cloNIDine  0.1 mg Oral BID   [MAR Hold] hydrALAZINE  100 mg Oral TID   [MAR Hold] insulin aspart  0-6 Units Subcutaneous TID WC   [MAR Hold] latanoprost  1 drop Both Eyes QHS   [MAR Hold] levothyroxine  150 mcg Oral QAC breakfast   [  MAR Hold] sildenafil  20 mg Oral TID   Continuous Infusions:  sodium chloride     acetaminophen       LOS: 0 days    Time spent:25 mins, More than 50% of that time was spent in counseling and/or coordination of care.      Shelly Coss, MD [facility] P8/05/2021, 11:05 AM

## 2021-02-22 NOTE — Transfer of Care (Signed)
Immediate Anesthesia Transfer of Care Note  Patient: Patricia Mata  Procedure(s) Performed: ENTEROSCOPY COLONOSCOPY WITH PROPOFOL HOT HEMOSTASIS (ARGON PLASMA COAGULATION/BICAP) HEMOSTASIS CLIP PLACEMENT POLYPECTOMY  Patient Location: Endoscopy Unit  Anesthesia Type:MAC  Level of Consciousness: drowsy and patient cooperative  Airway & Oxygen Therapy: Patient Spontanous Breathing and Patient connected to nasal cannula oxygen  Post-op Assessment: Report given to RN and Post -op Vital signs reviewed and stable  Post vital signs: Reviewed and stable  Last Vitals:  Vitals Value Taken Time  BP 188/41 02/22/21 1203  Temp    Pulse 68 02/22/21 1203  Resp 18 02/22/21 1203  SpO2 100 % 02/22/21 1203  Vitals shown include unvalidated device data.  Last Pain:  Vitals:   02/22/21 1203  TempSrc:   PainSc: 0-No pain         Complications: No notable events documented.

## 2021-02-22 NOTE — Progress Notes (Addendum)
[facility] Gastroenterology Progress Note  Patricia Mata 65 y.o. 1956-06-28  CC: Recurrent anemia, melena   Subjective: Patient seen and examined at bedside.  According to her she is having lighter colored stool now.  Hemoglobin dropped this morning, procedure is delayed because of need for blood transfusion.  ROS : Negative for fever and chills   Objective: Vital signs in last 24 hours: Vitals:   02/22/21 0454 02/22/21 0855  BP: (!) 158/47 (!) 160/51  Pulse: 74 71  Resp: 17 17  Temp: 98.3 F (36.8 C) 98.3 F (36.8 C)  SpO2: 100% 98%    Physical Exam:  General:  Alert, cooperative, no distress, appears stated age  Head:  Normocephalic, without obvious abnormality, atraumatic  Eyes:  , EOM's intact,   Lungs:   Clear to auscultation bilaterally, respirations unlabored  Heart:  Regular rate and rhythm, S1, S2 normal  Abdomen:   Soft, non-tender, bowel sounds active all four quadrants,  no masses,   Extremities: Extremities normal, atraumatic, no  edema  Pulses: 2+ and symmetric    Lab Results: Recent Labs    02/21/21 0645 02/22/21 0635  NA 137 135  K 3.5 3.6  CL 96* 95*  CO2 33* 29  GLUCOSE 155* 114*  BUN 49* 52*  CREATININE 3.31* 4.08*  CALCIUM 9.1 9.5  PHOS  --  4.4   Recent Labs    02/22/21 0635  ALBUMIN 2.8*   Recent Labs    02/20/21 1308 02/20/21 1532 02/21/21 0645 02/22/21 0329 02/22/21 0635  WBC 4.7 5.2   < > 3.8* 3.9*  NEUTROABS 3.4 3.6  --   --   --   HGB 5.9* 5.0*   < > 6.2* 6.3*  HCT 17.2* 15.3*   < > 18.5* 18.7*  MCV 87.8 89.5   < > 88.9 90.3  PLT 171 184   < > 147* 148*   < > = values in this interval not displayed.   Recent Labs    02/20/21 1532  LABPROT 12.9  INR 1.0      Assessment/Plan: -Symptomatic anemia with melena.  Hemoglobin was down to 5  on 02/20/2021  -History of MDS requiring multiple transfusions -End-stage renal disease on dialysis -History of colon cancer.  colonoscopy in February 2021 showed 1 tubular  adenoma and repeat was recommended in 3 years.   Recommendations ----------------------- -Hemoglobin 6.2 this morning.  Awaiting blood transfusion prior to starting procedures. -Plan for push enteroscopy and colonoscopy sometime today.  Risks (bleeding, infection, bowel perforation that could require surgery, sedation-related changes in cardiopulmonary systems), benefits (identification and possible treatment of source of symptoms, exclusion of certain causes of symptoms), and alternatives (watchful waiting, radiographic imaging studies, empiric medical treatment)  were explained to patient/family in detail and patient wishes to proceed.    Otis Brace MD, [place] 02/22/2021, 9:58 AM  Contact #  [phone]

## 2021-02-22 NOTE — Op Note (Signed)
[facility] Patient Name: Patricia Mata Procedure Date : 02/22/2021 MRN: [id number] Attending MD: Otis Brace , MD Date of Birth: Jan 07, 1956 CSN: [id number] Age: 65 Admit Type: Inpatient Procedure:                Colonoscopy Indications:              Last colonoscopy: 2021, Acute post hemorrhagic                            anemia Providers:                Otis Brace, MD, Doristine Johns, RN, Hinton Dyer, Claybon Jabs CRNA, CRNA Referring MD:              Medicines:                Sedation Administered by an Anesthesia Professional Complications:            No immediate complications. Estimated Blood Loss:     Estimated blood loss was minimal. Procedure:                Pre-Anesthesia Assessment:                           - Prior to the procedure, a History and Physical                            was performed, and patient medications and                            allergies were reviewed. The patient's tolerance of                            previous anesthesia was also reviewed. The risks                            and benefits of the procedure and the sedation                            options and risks were discussed with the patient.                            All questions were answered, and informed consent                            was obtained. Prior Anticoagulants: The patient has                            taken no previous anticoagulant or antiplatelet                            agents. ASA Grade Assessment: III - A patient with  severe systemic disease. After reviewing the risks                            and benefits, the patient was deemed in                            satisfactory condition to undergo the procedure.                           - Prior to the procedure, a History and Physical                            was performed, and patient medications and                             allergies were reviewed. The patient's tolerance of                            previous anesthesia was also reviewed. The risks                            and benefits of the procedure and the sedation                            options and risks were discussed with the patient.                            All questions were answered, and informed consent                            was obtained. Prior Anticoagulants: The patient has                            taken no previous anticoagulant or antiplatelet                            agents. ASA Grade Assessment: III - A patient with                            severe systemic disease. After reviewing the risks                            and benefits, the patient was deemed in                            satisfactory condition to undergo the procedure.                           After obtaining informed consent, the colonoscope                            was passed under direct vision. Throughout the  procedure, the patient's blood pressure, pulse, and                            oxygen saturations were monitored continuously. The                            [id number]-HQ190L ([id number] Olympus colonoscope was                            introduced through the anus and advanced to the the                            ileocolonic anastomosis. The colonoscopy was                            performed without difficulty. The patient tolerated                            the procedure well. The quality of the bowel                            preparation was fair. Scope In: 11:45:33 AM Scope Out: 11:54:29 AM Scope Withdrawal Time: 0 hours 4 minutes 33 seconds  Total Procedure Duration: 0 hours 8 minutes 56 seconds  Findings:      The perianal and digital rectal examinations were normal.      The terminal ileum appeared normal.      A few diverticula were found in the sigmoid colon.      Internal hemorrhoids were found during  retroflexion. The hemorrhoids       were medium-sized. Impression:               - Preparation of the colon was fair.                           - The examined portion of the ileum was normal.                           - Diverticulosis in the sigmoid colon.                           - Internal hemorrhoids.                           - No specimens collected. Recommendation:           - Patient has a contact number available for                            emergencies. The signs and symptoms of potential                            delayed complications were discussed with the                            patient. Return to normal activities tomorrow.  Written discharge instructions were provided to the                            patient.                           - Resume previous diet.                           - Continue present medications.                           - Repeat colonoscopy in 5 years for surveillance.                           - Return to my office PRN. Procedure Code(s):        --- Professional ---                           [id number], Colonoscopy, flexible; diagnostic, including                            collection of specimen(s) by brushing or washing,                            when performed (separate procedure) Diagnosis Code(s):        --- Professional ---                           K64.8, Other hemorrhoids                           D62, Acute posthemorrhagic anemia                           K57.30, Diverticulosis of large intestine without                            perforation or abscess without bleeding CPT copyright 2019 [employer]. All rights reserved. The codes documented in this report are preliminary and upon coder review may  be revised to meet current compliance requirements. Otis Brace, MD Otis Brace, MD 02/22/2021 12:22:37 PM Number of Addenda: 0

## 2021-02-22 NOTE — Consult Note (Signed)
[facility] Renal Consultation Note    Indication for Consultation:  Management of ESRD/hemodialysis; anemia, hypertension/volume and secondary hyperparathyroidism PCP: [name] FNP  HPI: Patricia Mata is a 65 Y/O female with ESRD on hemodialysis T,Th,S at [facility]. PMH: DMT2, HTN, severe AS, pulmonary HTN, refractory MDS followed by Dr. Alen Blew requiring q 2 weekly blood transfusions in addition to ESA given at OP HD units. H/O Stage III colon cancer S/P hemicolectomy 2008.  She is compliant with HD, labs at goal. Excellent HD patient.    She presented to [facility] ED 02/20/2021 with HGB 5, black stools. She received 2 units PRBCs. Repeat HGB 7.6 02/21/2021 She was admitted as observation patient for symptomatic anemia with melena. She was seen by GI, went for endoscopy and colonoscopy this AM.  HGB fell to 6.2 this AM, she received additional unit of PRBCs this AM.  She presents to HD for dialysis today on schedule.   Past Medical History:  Diagnosis Date   Acute renal failure (ARF) ([place]) 06/08/2018   TTHS- [place]   Carotid artery disease [facility])    Chronic diastolic (congestive) heart failure (HCC)    Colon cancer ([place]) 2008   Diabetes mellitus ([place])    Dyspnea    Gout 09/08/2018   Heart attack ([place])    Heart murmur    Hypertension    Hypothyroidism    Macrocytic anemia    MDS (myelodysplastic syndrome) (HCC)    Moderate mitral stenosis    PAD (peripheral artery disease) (HCC)    PAF (paroxysmal atrial fibrillation) (HCC)    Pulmonary hypertension (HCC)    Renal artery stenosis (HCC)    a. >60% R renal artery stenosis by duplex 2021, managed medically.   Severe aortic stenosis    Stage 4 chronic kidney disease (HCC)    Transfusion-dependent anemia    Past Surgical History:  Procedure Laterality Date   AV FISTULA PLACEMENT Right 09/26/2020   Procedure: RIGHT ARM ARTERIOVENOUS (AV) FISTULA CREATION;  Surgeon: Waynetta Sandy,  MD;  Location: [place];  Service: Vascular;  Laterality: Right;  Monitor Anesthesia Care with Regional Block to Right Arm    [place] Right 12/13/2020   Procedure: RIGHT UPPER EXTREMITY ARTERIOVENOUS FISTULA TRANSPOSITION;  Surgeon: Waynetta Sandy, MD;  Location: [place];  Service: Vascular;  Laterality: Right;   COLON SURGERY     DEBRIDMENT OF DECUBITUS ULCER N/A 06/12/2018   Procedure: DEBRIDMENT OF DECUBITUS ULCER;  Surgeon: Wallace Going, DO;  Location: [facility];  Service: Plastics;  Laterality: N/A;   ESOPHAGOGASTRODUODENOSCOPY N/A 09/01/2020   Procedure: ESOPHAGOGASTRODUODENOSCOPY (EGD);  Surgeon: Clarene Essex, MD;  Location: [facility];  Service: Endoscopy;  Laterality: N/A;   GIVENS CAPSULE STUDY N/A 09/01/2020   Procedure: GIVENS CAPSULE STUDY;  Surgeon: Clarene Essex, MD;  Location: [facility];  Service: Endoscopy;  Laterality: N/A;   IR FLUORO GUIDE CV LINE RIGHT  10/11/2020   IR FLUORO GUIDE PORT INSERTION RIGHT  04/02/2017   IR REMOVAL TUN ACCESS W/ PORT W/O FL MOD SED  03/16/2019   IR US GUIDE VASC ACCESS RIGHT  04/02/2017   IR US GUIDE VASC ACCESS RIGHT  10/11/2020   LIGATION OF COMPETING BRANCHES OF ARTERIOVENOUS FISTULA Right 12/13/2020   Procedure: LIGATION OF COMPETING BRANCHES OF RIGHT UPPER EXTREMITY ARTERIOVENOUS FISTULA;  Surgeon: Waynetta Sandy, MD;  Location: [place];  Service: Vascular;  Laterality: Right;   RIGHT HEART CATH N/A 04/23/2019   Procedure: RIGHT HEART  CATH;  Surgeon: Jolaine Artist, MD;  Location: [place] [facility];  Service: Cardiovascular;  Laterality: N/A;   RIGHT/LEFT HEART CATH AND CORONARY ANGIOGRAPHY N/A 05/21/2018   Procedure: RIGHT/LEFT HEART CATH AND CORONARY ANGIOGRAPHY;  Surgeon: Nelva Bush, MD;  Location: [place] [facility];  Service: Cardiovascular;  Laterality: N/A;   RIGHT/LEFT HEART CATH AND CORONARY ANGIOGRAPHY N/A 10/13/2020   Procedure: RIGHT/LEFT HEART CATH AND CORONARY ANGIOGRAPHY;  Surgeon:  Jolaine Artist, MD;  Location: [place] [facility];  Service: Cardiovascular;  Laterality: N/A;   TUBAL LIGATION     Family History  Problem Relation Age of Onset   Hypertension Mother    Diabetes Mother    Cervical cancer Mother    Heart attack Father    Hypertension Sister    Hypertension Brother    Hypertension Sister    Hypertension Sister    Prostate cancer Brother    HIV/AIDS Brother    Social History:  reports that she quit smoking about 25 years ago. Her smoking use included cigarettes. She has a 5.00 pack-year smoking history. She has quit using smokeless tobacco. She reports current alcohol use of about 1.0 standard drink per week. She reports that she does not use drugs. Allergies  Allergen Reactions   Ancef [Cefazolin] Itching    Severe itching- after procedure, ancef was the antibiotic.-04/02/17 Tolerates penicillins   Lactose Intolerance (Gi) Diarrhea   Morphine And Related Itching   Sulfa Antibiotics Rash and Other (See Comments)    Blisters, also   Prior to Admission medications   Medication Sig Start Date End Date Taking? Authorizing Provider  allopurinol (ZYLOPRIM) 300 MG tablet Take 300 mg by mouth daily. 07/05/20  Yes [provider]  atorvastatin (LIPITOR) 40 MG tablet Take 1 tablet by mouth once daily Patient taking differently: Take 40 mg by mouth daily. 11/13/20  Yes [name], FNP  carvedilol (COREG) 3.125 MG tablet Take 1 tablet (3.125 mg total) by mouth 2 (two) times daily. 11/27/20  Yes Clegg, Amy D, NP  cholecalciferol (VITAMIN D3) 25 MCG (1000 UNIT) tablet Take 1,000 Units by mouth daily.   Yes [provider]  cloNIDine (CATAPRES) 0.1 MG tablet Take 1 tablet (0.1 mg total) by mouth 2 (two) times daily. 07/03/20  Yes Bensimhon, Shaune Pascal, MD  EUTHYROX 150 MCG tablet TAKE 1 TABLET BY MOUTH ONCE DAILY BEFORE BREAKFAST Patient taking differently: Take 150 mcg by mouth daily before breakfast. 02/12/21  Yes [name], FNP   hydrALAZINE (APRESOLINE) 100 MG tablet Take 1 tablet (100 mg total) by mouth 3 (three) times daily. 09/27/20  Yes Lorretta Harp, MD  Insulin Glargine [facility]) 100 UNIT/ML Inject 15 Units into the skin daily as needed (only if blood sugar over 150). Only if above 150   Yes [provider]  latanoprost (XALATAN) 0.005 % ophthalmic solution Place 1 drop into both eyes at bedtime.    Yes [provider]  lidocaine-prilocaine (EMLA) cream Apply 1 application topically daily as needed (port access). 01/23/21  Yes [provider]  linagliptin (TRADJENTA) 5 MG TABS tablet Take 1 tablet (5 mg total) by mouth daily. 12/12/20  Yes [name], FNP  Multiple Vitamins-Minerals (CENTRUM SILVER 50+WOMEN) TABS Take 1 tablet by mouth daily.   Yes [provider]  polyethylene glycol (MIRALAX / GLYCOLAX) 17 g packet Take 17 g by mouth daily as needed for moderate constipation. 01/14/21  Yes Vann, Jessica U, DO  sildenafil (REVATIO) 20 MG tablet  TAKE 1 TABLET BY MOUTH THREE TIMES DAILY Patient taking differently: Take 20 mg by mouth 3 (three) times daily. 02/05/21  Yes Sherren Mocha, MD  amiodarone (PACERONE) 200 MG tablet Take 1 tablet (200 mg total) by mouth daily. Patient not taking: Reported on 02/20/2021 07/19/20   Bensimhon, Shaune Pascal, MD  blood glucose meter kit and supplies Dispense based on patient and insurance preference. Use up to four times daily as directed. (FOR ICD-10 E10.9, E11.9). 11/04/18   [name], FNP  Blood Glucose Monitoring Suppl [facility] Karsten Fells) w/Device KIT Check blood sugars twice daily E11.9 11/30/19   [name], FNP  glucose blood test strip check blood sugars twice daily Dx code E11.22 03/02/20   [name], FNP  Insulin Pen Needle 29G X 5MM MISC Use as directed 06/16/17   Bonnielee Haff, MD  Lancets (ONETOUCH DELICA PLUS [id number]) [place] check blood sugars twice daily Dx code: E11.22 03/02/20   [name], FNP   Current  Facility-Administered Medications  Medication Dose Route Frequency Provider Last Rate Last Admin   0.9 %  sodium chloride infusion (Manually program via Guardrails IV Fluids)   Intravenous Once Irene Pap N, DO       acetaminophen (OFIRMEV) 10 MG/ML IV            acetaminophen (TYLENOL) tablet 650 mg  650 mg Oral Q6H PRN Rise Patience, MD   650 mg at 02/21/21 6811   Or   acetaminophen (TYLENOL) suppository 650 mg  650 mg Rectal Q6H PRN Rise Patience, MD       allopurinol (ZYLOPRIM) tablet 300 mg  300 mg Oral Daily Rise Patience, MD   300 mg at 02/21/21 0858   atorvastatin (LIPITOR) tablet 40 mg  40 mg Oral Daily Rise Patience, MD   40 mg at 02/21/21 0857   carvedilol (COREG) tablet 3.125 mg  3.125 mg Oral BID Rise Patience, MD   3.125 mg at 02/21/21 2125   Chlorhexidine Gluconate Cloth 2 % PADS 6 each  6 each Topical Daily Charlynne Cousins, MD   6 each at 02/21/21 1405   cloNIDine (CATAPRES) tablet 0.1 mg  0.1 mg Oral BID Rise Patience, MD   0.1 mg at 02/21/21 2125   hydrALAZINE (APRESOLINE) tablet 100 mg  100 mg Oral TID Rise Patience, MD   100 mg at 02/21/21 2125   insulin aspart (novoLOG) injection 0-6 Units  0-6 Units Subcutaneous TID WC Rise Patience, MD       latanoprost (XALATAN) 0.005 % ophthalmic solution 1 drop  1 drop Both Eyes QHS Rise Patience, MD   1 drop at 02/21/21 2151   levothyroxine (SYNTHROID) tablet 150 mcg  150 mcg Oral QAC breakfast Rise Patience, MD   150 mcg at 02/21/21 5726   melatonin tablet 3 mg  3 mg Oral QHS PRN Kayleen Memos, DO   3 mg at 02/21/21 2150   sildenafil (REVATIO) tablet 20 mg  20 mg Oral TID Rise Patience, MD   20 mg at 02/21/21 2126   Labs: Basic Metabolic Panel: Recent Labs  Lab 02/20/21 1532 02/21/21 0645 02/22/21 0635 02/22/21 1111  NA 139 137 135 135  K 3.1* 3.5 3.6 3.8  CL 96* 96* 95* 96*  CO2 33* 33* 29  --   GLUCOSE 136* 155* 114* 111*  BUN 30* 49* 52*  50*  CREATININE 2.50* 3.31* 4.08* 4.10*  CALCIUM 8.5* 9.1 9.5  --  PHOS  --   --  4.4  --    Liver Function Tests: Recent Labs  Lab 02/22/21 0635  ALBUMIN 2.8*   No results for input(s): LIPASE, AMYLASE in the last 168 hours. No results for input(s): AMMONIA in the last 168 hours. CBC: Recent Labs  Lab 02/20/21 1308 02/20/21 1308 02/20/21 1532 02/21/21 0645 02/22/21 0329 02/22/21 0635 02/22/21 1111  WBC 4.7   < > 5.2 5.3 3.8* 3.9*  --   NEUTROABS 3.4  --  3.6  --   --   --   --   HGB 5.9*   < > 5.0* 7.6* 6.2* 6.3* 7.5*  HCT 17.2*  --  15.3* 22.3* 18.5* 18.7* 22.0*  MCV 87.8  --  89.5 88.5 88.9 90.3  --   PLT 171   < > 184 162 147* 148*  --    < > = values in this interval not displayed.   Cardiac Enzymes: No results for input(s): CKTOTAL, CKMB, CKMBINDEX, TROPONINI in the last 168 hours. CBG: Recent Labs  Lab 02/21/21 1221 02/21/21 1334 02/21/21 1647 02/22/21 0651 02/22/21 1255  GLUCAP 146* 231* 131* 135* 114*   Iron Studies: No results for input(s): IRON, TIBC, TRANSFERRIN, FERRITIN in the last 72 hours. Studies/Results: DG CHEST PORT 1 VIEW  Result Date: 02/20/2021 CLINICAL DATA:  Chest pain EXAM: PORTABLE CHEST 1 VIEW COMPARISON:  01/13/2021 FINDINGS: Cardiac shadow is mildly prominent but accentuated by the portable technique. Dialysis catheter is noted in satisfactory position. Aortic calcifications are noted. Patient is rotated to the right accentuating the mediastinal markings. No focal infiltrate or effusion is seen. IMPRESSION: No acute abnormality noted. Electronically Signed   By: Inez Catalina M.D.   On: 02/20/2021 23:53    ROS: As per HPI otherwise negative.   Physical Exam: Vitals:   02/22/21 1203 02/22/21 1211 02/22/21 1223 02/22/21 1251  BP: (!) 188/41 (!) 177/70 (!) 160/100 (!) 168/52  Pulse: 69 65 68 63  Resp: '17 12 17 16  ' Temp: (!) 97.2 F (36.2 C)   98.3 F (36.8 C)  TempSrc: Temporal   Oral  SpO2: 100% 100% 100% 97%  Weight:       Height:         General: Well developed, well nourished, in no acute distress. Head: Normocephalic, atraumatic, sclera non-icteric, mucus membranes are moist Neck: Supple. JVD not elevated. Lungs: Clear bilaterally to auscultation without wheezes, rales, or rhonchi. Breathing is unlabored. Heart: RRR with S1 S2. No murmurs, rubs, or gallops appreciated. Abdomen: Soft, non-tender, non-distended with normoactive bowel sounds. No rebound/guarding. No obvious abdominal masses. M-S:  Strength and tone appear normal for age. Lower extremities:without edema or ischemic changes, no open wounds  Neuro: Alert and oriented X 3. Moves all extremities spontaneously. Psych:  Responds to questions appropriately with a normal affect. Dialysis Access: RIJ TDC Drsg intact. R AVF +T/B  Dialysis Orders: HD Orders: [facility] T,Th,S 4 hrs 180NRe 400/autoflow 1.5 2.0K/2.0 Ca AVF/TDC -Heparin 2000 units IV TIW -Mircera 225 mcg IV q 2 weeks (last dose 02/08/2021) -Calcitriol 0.25 mcg PO TIW    Assessment/Plan:  Symptomatic Anemia/[place] Stools: HGB 5.0 on admission. S/P 3 units PRBCs so far. Rechecking HGB prior to HD. Transfuse PRN. Went for endoscopy/colonoscopy this AM. Few diverticula found in sigmoid colon, internal hemorrhoids. A single angioectasia with stigmata of recent bleeding was found in the second portion of the duodenum. Lesion ablated. Per GI/Primary MDS-follows with Dr. Alen Blew.   ESRD -  T,Th,S.  HD today on schedule  Hypertension/volume  - Hypertensive but no BP meds this AM. No evidence of volume overload. UF as tolerated. Resume home medications.   Anemia  - As noted above.  Metabolic bone disease -  Labs at goal. Continue VDRA. No binders on OP Med list.   Nutrition - Soft diet at present. Renal Carb mod diet when diet advanced. Albumin low. Add protein supps, renal vits. Pulmonary HTN- On sildenafil. Follows with Dr. Haroldine Laws. Per primary.  Severe AS/Moderate MS-follows with  cardiology DMT2-per primary  Jimmye Norman. Owens Shark, NP-C 02/22/2021, 3:17 PM  [employer] [phone]

## 2021-02-22 NOTE — Anesthesia Preprocedure Evaluation (Addendum)
Anesthesia Evaluation  Patient identified by MRN, date of birth, ID band Patient awake    Reviewed: Allergy & Precautions, NPO status , Patient's Chart, lab work & pertinent test results, reviewed documented beta blocker date and time   History of Anesthesia Complications (+) DIFFICULT AIRWAY and history of anesthetic complications (listed as a difficult airway in 2021- pt does not know anything about this. has had multiple MAC and LMA anesthestics w/o issue )  Airway Mallampati: III  TM Distance: >3 FB Neck ROM: Full   Comment: High arched palate Dental  (+) Poor Dentition, Missing, Dental Advisory Given, Chipped,    Pulmonary shortness of breath and with exertion, former smoker,  Quit smoking 1997, 5 pack year history    Pulmonary exam normal breath sounds clear to auscultation       Cardiovascular hypertension, Pt. on medications and Pt. on home beta blockers pulmonary hypertension (severe pHTN)+ angina with exertion + Past MI, + Peripheral Vascular Disease and +CHF (LVEF 50-55%, mildly reduced RV fx)  Normal cardiovascular exam+ Valvular Problems/Murmurs (severe AS, mod MS) AS  Rhythm:Regular Rate:Normal  Echo 2022: 1. Left ventricular ejection fraction, by estimation, is 50 to 55%. The  left ventricle has low normal function. The left ventricle has no regional  wall motion abnormalities. The left ventricular internal cavity size was  mildly dilated. Left ventricular  diastolic parameters are indeterminate.  2. Right ventricular systolic function is mildly reduced. The right  ventricular size is normal. There is severely elevated pulmonary artery  systolic pressure. The estimated right ventricular systolic pressure is  24.0 mmHg.  3. Left atrial size was mildly dilated.  4. Right atrial size was mildly dilated.  5. The mitral valve is degenerative. Trivial mitral valve regurgitation.  Moderate mitral stenosis. The mean  mitral valve gradient is 7.0 mmHg with  MVA 1.5 cm^2 by VTI. Moderate to severe mitral annular calcification.  6. The aortic valve is tricuspid. Aortic valve regurgitation is mild. Severe aortic valve stenosis. Aortic valve area, by VTI measures 0.71 cm.  Aortic valve mean gradient measures 65.0 mmHg.  7. The inferior vena cava is dilated in size with >50% respiratory variability, suggesting right atrial pressure of 8 mmHg.  8. The patient was in atrial fibrillation.     Severe AS- has been told she is non-operative  BP 191/42 in preop, per pt this is higher than normal but her DBP is always very low   Neuro/Psych negative neurological ROS  negative psych ROS   GI/Hepatic Neg liver ROS, GERD  Controlled,  Endo/Other  diabetes, Well Controlled, Type 2, Insulin DependentHypothyroidism a1c 6.1  Renal/GU ESRF and DialysisRenal diseaseDue for HD today K 3.5 yesterday   negative genitourinary   Musculoskeletal  (+) Arthritis , Osteoarthritis,    Abdominal   Peds  Hematology  (+) Blood dyscrasia, anemia , Hb 6.2 this AM from 7.6   Anesthesia Other Findings   Reproductive/Obstetrics negative OB ROS                            Anesthesia Physical Anesthesia Plan  ASA: 4  Anesthesia Plan: MAC   Post-op Pain Management:    Induction:   PONV Risk Score and Plan: 2 and Propofol infusion and TIVA  Airway Management Planned: Natural Airway and Simple Face Mask  Additional Equipment: None  Intra-op Plan:   Post-operative Plan:   Informed Consent: I have reviewed the patients History and Physical, chart, labs  and discussed the procedure including the risks, benefits and alternatives for the proposed anesthesia with the patient or authorized representative who has indicated his/her understanding and acceptance.     Dental advisory given  Plan Discussed with: CRNA  Anesthesia Plan Comments: (Hb 6.3 this AM- 1 unit of blood ordered but not  given. Will give in preop prior to anesthesia; per pt, gets premeds for itching whenever she needs blood- will give tylenol and benadryl. Normal Hb per pt is 7-8 Due for HD this afternoon, K 3.6 at 6:30am)      Anesthesia Quick Evaluation

## 2021-02-22 NOTE — Op Note (Signed)
[facility] Patient Name: Patricia Mata Procedure Date : 02/22/2021 MRN: [id number] Attending MD: Otis Brace , MD Date of Birth: [birth year] CSN: [id number] Age: 65 Admit Type: Inpatient Procedure:                Small bowel enteroscopy Indications:              Acute post hemorrhagic anemia, Anemia, Melena Providers:                Otis Brace, MD, Doristine Johns, RN, Hinton Dyer, Claybon Jabs CRNA, CRNA Referring MD:              Medicines:                Sedation Administered by an Anesthesia Professional Complications:            No immediate complications. Estimated Blood Loss:     Estimated blood loss was minimal. Procedure:                Pre-Anesthesia Assessment:                           - Prior to the procedure, a History and Physical                            was performed, and patient medications and                            allergies were reviewed. The patient's tolerance of                            previous anesthesia was also reviewed. The risks                            and benefits of the procedure and the sedation                            options and risks were discussed with the patient.                            All questions were answered, and informed consent                            was obtained. Prior Anticoagulants: The patient has                            taken no previous anticoagulant or antiplatelet                            agents. ASA Grade Assessment: III - A patient with                            severe systemic disease. After reviewing the risks  and benefits, the patient was deemed in                            satisfactory condition to undergo the procedure.                           After obtaining informed consent, the endoscope was                            passed under direct vision. Throughout the                            procedure, the patient's  blood pressure, pulse, and                            oxygen saturations were monitored continuously. The                            [id number]-[id number] ([id number]) Olympus colonoscope was                            introduced through the mouth and advanced to the                            jejunum, to the 100 cm mark (from the incisors).                            The small bowel enteroscopy was accomplished                            without difficulty. The patient tolerated the                            procedure well. Scope In: Scope Out: Findings:      The Z-line was regular and was found 40 cm from the incisors.      The cardia and gastric fundus were normal on retroflexion.      A single small angioectasia with no bleeding was found in the gastric       body. Fulguration to ablate the lesion to prevent bleeding by argon       plasma was successful.      A single angioectasia with stigmata of recent bleeding was found in the       second portion of the duodenum. Fulguration to ablate the lesion to       prevent bleeding by argon plasma was successful. To prevent bleeding       post-intervention, two hemostatic clips were successfully placed. There       was no bleeding at the end of the procedure.      A single 6 mm sessile polyp with no bleeding was found in the second       portion of the duodenum. The polyp was removed with a hot snare.       Resection and retrieval were complete.      A few angioectasias with stigmata of recent bleeding were found at 26 to  100 cm (from the incisors). Fulguration to ablate the lesion to prevent       bleeding by argon plasma was successful.      Diffuse mild mucosal changes characterized by discoloration were found       in the entire duodenum.      Diffuse mild mucosal changes characterized by discoloration were found       in the jejunum. Impression:               - Z-line regular, 40 cm from the incisors.                           - A single  non-bleeding angioectasia in the                            stomach. Treated with argon plasma coagulation                            (APC).                           - A single recently bleeding angioectasia in the                            duodenum. Treated with argon plasma coagulation                            (APC). Clips were placed.                           - A single duodenal polyp. Resected and retrieved.                           - A few recently bleeding angioectasias in the                            jejunum. Treated with argon plasma coagulation                            (APC).                           - Mucosal changes in the duodenum.                           - Mucosal changes in the jejunum. Recommendation:           - Perform a colonoscopy today. Procedure Code(s):        --- Professional ---                           [phone], 50, Small intestinal endoscopy, enteroscopy                            beyond second portion of duodenum, not including  ileum; with control of bleeding (eg, injection,                            bipolar cautery, unipolar cautery, laser, heater                            probe, stapler, plasma coagulator)                           [id number], Small intestinal endoscopy, enteroscopy                            beyond second portion of duodenum, not including                            ileum; with removal of tumor(s), polyp(s), or other                            lesion(s) by snare technique Diagnosis Code(s):        --- Professional ---                           K31.811, Angiodysplasia of stomach and duodenum                            with bleeding                           K31.7, Polyp of stomach and duodenum                           K55.21, Angiodysplasia of colon with hemorrhage                           D62, Acute posthemorrhagic anemia                           D64.9, Anemia, unspecified                            K92.1, Melena (includes Hematochezia) CPT copyright 2019 [employer]. All rights reserved. The codes documented in this report are preliminary and upon coder review may  be revised to meet current compliance requirements. Otis Brace, MD Otis Brace, MD 02/22/2021 12:14:31 PM Number of Addenda: 0

## 2021-02-22 NOTE — Brief Op Note (Signed)
02/20/2021 - 02/22/2021  12:36 PM  PATIENT:  Patricia Mata  65 y.o. female  PRE-OPERATIVE DIAGNOSIS:  Melena, symptomatic anemia  POST-OPERATIVE DIAGNOSIS:  enteroscopy: AVMs jejunum and gastric tx w/APC and clip placement x2 and polyp in duodenum  colon: anastomosis   PROCEDURE:  Procedure(s): ENTEROSCOPY (N/A) COLONOSCOPY WITH PROPOFOL (N/A) HOT HEMOSTASIS (ARGON PLASMA COAGULATION/BICAP) (N/A) HEMOSTASIS CLIP PLACEMENT POLYPECTOMY  SURGEON:  Surgeon(s) and Role:    * Renise Gillies, MD - Primary  Findings ----------- -Push enteroscopy showed AVM with stigmata of bleeding in the second portion of the duodenum.  Treated with APC and 2 clips placement.  Also showed few scattered AVMs in the jejunum treated with APC.  Small duodenal polyp removed with a hot snare.  1 small AVM in the gastric body treated with APC. -Colonoscopy showed fair prep but no evidence of active bleeding.   Recommendations ------------------------- -Start soft diet and advance as tolerated -Monitor H&H.  Transfuse if hemoglobin less than 7 -Repeat CBC in the morning -GI will follow  Otis Brace MD, FACP 02/22/2021, 12:37 PM  Contact #  [phone]

## 2021-02-22 NOTE — Plan of Care (Signed)
  Problem: Health Behavior/Discharge Planning: Goal: Ability to manage health-related needs will improve Outcome: Progressing   

## 2021-02-22 NOTE — Anesthesia Postprocedure Evaluation (Signed)
Anesthesia Post Note  Patient: Shamanda Len  Procedure(s) Performed: ENTEROSCOPY COLONOSCOPY WITH PROPOFOL HOT HEMOSTASIS (ARGON PLASMA COAGULATION/BICAP) HEMOSTASIS CLIP PLACEMENT POLYPECTOMY     Patient location during evaluation: PACU Anesthesia Type: MAC Level of consciousness: awake and alert Pain management: pain level controlled Vital Signs Assessment: post-procedure vital signs reviewed and stable Respiratory status: spontaneous breathing, nonlabored ventilation and respiratory function stable Cardiovascular status: blood pressure returned to baseline and stable Postop Assessment: no apparent nausea or vomiting Anesthetic complications: no   No notable events documented.  Last Vitals:  Vitals:   02/22/21 0957 02/22/21 1203  BP: (!) 201/37 (!) 188/41  Pulse: 73   Resp: 16   Temp:    SpO2: (!) 74%     Last Pain:  Vitals:   02/22/21 1203  TempSrc:   PainSc: 0-No pain                 Pervis Hocking

## 2021-02-23 ENCOUNTER — Encounter (HOSPITAL_COMMUNITY): Payer: Self-pay | Admitting: Gastroenterology

## 2021-02-23 ENCOUNTER — Telehealth: Payer: Self-pay | Admitting: Cardiovascular Disease

## 2021-02-23 ENCOUNTER — Ambulatory Visit (HOSPITAL_COMMUNITY)
Admission: RE | Admit: 2021-02-23 | Discharge: 2021-02-23 | Disposition: A | Discharge status: Home / Self Care | Payer: Medicare Other | Source: Ambulatory Visit | Attending: Cardiology | Admitting: Cardiology

## 2021-02-23 LAB — BPAM RBC
Blood Product Expiration Date: 202208292359
Blood Product Expiration Date: 202208292359
ISSUE DATE / TIME: 202208110953
ISSUE DATE / TIME: 202208111821
Unit Type and Rh: 600
Unit Type and Rh: 600

## 2021-02-23 LAB — CBC WITH DIFFERENTIAL/PLATELET
Abs Immature Granulocytes: 0.03 10*3/uL (ref 0.00–0.07)
Basophils Absolute: 0 10*3/uL (ref 0.0–0.1)
Basophils Relative: 0 %
Eosinophils Absolute: 0.1 10*3/uL (ref 0.0–0.5)
Eosinophils Relative: 1 %
HCT: 25.8 % — ABNORMAL LOW (ref 36.0–46.0)
Hemoglobin: 8.8 g/dL — ABNORMAL LOW (ref 12.0–15.0)
Immature Granulocytes: 1 %
Lymphocytes Relative: 12 %
Lymphs Abs: 0.6 10*3/uL — ABNORMAL LOW (ref 0.7–4.0)
MCH: 30.6 pg (ref 26.0–34.0)
MCHC: 34.1 g/dL (ref 30.0–36.0)
MCV: 89.6 fL (ref 80.0–100.0)
Monocytes Absolute: 0.8 10*3/uL (ref 0.1–1.0)
Monocytes Relative: 17 %
Neutro Abs: 3.4 10*3/uL (ref 1.7–7.7)
Neutrophils Relative %: 69 %
Platelets: 131 10*3/uL — ABNORMAL LOW (ref 150–400)
RBC: 2.88 MIL/uL — ABNORMAL LOW (ref 3.87–5.11)
RDW: 16.1 % — ABNORMAL HIGH (ref 11.5–15.5)
WBC: 4.9 10*3/uL (ref 4.0–10.5)
nRBC: 1.4 % — ABNORMAL HIGH (ref 0.0–0.2)

## 2021-02-23 LAB — HEPATITIS B SURFACE ANTIBODY, QUANTITATIVE: Hep B S AB Quant (Post): 18.3 m[IU]/mL (ref >9.9)

## 2021-02-23 LAB — BASIC METABOLIC PANEL
Anion gap: 7 (ref 5–15)
BUN: 23 mg/dL (ref 8–23)
CO2: 29 mmol/L (ref 22–32)
Calcium: 9.1 mg/dL (ref 8.9–10.3)
Chloride: 98 mmol/L (ref 98–111)
Creatinine, Ser: 2.61 mg/dL — ABNORMAL HIGH (ref 0.44–1.00)
GFR, Estimated: 20 mL/min — ABNORMAL LOW (ref >60)
Glucose, Bld: 176 mg/dL — ABNORMAL HIGH (ref 70–99)
Potassium: 3.9 mmol/L (ref 3.5–5.1)
Sodium: 134 mmol/L — ABNORMAL LOW (ref 135–145)

## 2021-02-23 LAB — TYPE AND SCREEN
ABO/RH(D): A NEG
Antibody Screen: NEGATIVE
Unit division: 0
Unit division: 0

## 2021-02-23 LAB — GLUCOSE, CAPILLARY
Glucose-Capillary: 151 mg/dL — ABNORMAL HIGH (ref 70–99)
Glucose-Capillary: 200 mg/dL — ABNORMAL HIGH (ref 70–99)

## 2021-02-23 LAB — SURGICAL PATHOLOGY

## 2021-02-23 NOTE — Plan of Care (Signed)
Pt alert and oriented x 4. Ad lib. No complaints of pain. Melatonin given prn for sleep and was effective. Pt and vitals stable. Bp elevated at hs vitals due to all BP meds held for hemo on 8/11.  Problem: Health Behavior/Discharge Planning: Goal: Ability to manage health-related needs will improve Outcome: Progressing   Problem: Clinical Measurements: Goal: Ability to maintain clinical measurements within normal limits will improve Outcome: Progressing Goal: Will remain free from infection Outcome: Progressing Goal: Diagnostic test results will improve Outcome: Progressing Goal: Respiratory complications will improve Outcome: Progressing Goal: Cardiovascular complication will be avoided Outcome: Progressing   Problem: Activity: Goal: Risk for activity intolerance will decrease Outcome: Progressing   Problem: Coping: Goal: Level of anxiety will decrease Outcome: Progressing   Problem: Safety: Goal: Ability to remain free from injury will improve Outcome: Progressing

## 2021-02-23 NOTE — Telephone Encounter (Signed)
Called pt back in regards to missed appointment.  She reports that she had an appointment for testing today at 9 am.  She was released from the hospital today at 3pm per her statement.  I advised her to call [facility] office [phone] to reschedule.  I advised her to let office no she was in the hospital at time of appointment.  She expressed that she will call to reschedule on Monday.

## 2021-02-23 NOTE — Plan of Care (Signed)
  Problem: Health Behavior/Discharge Planning: Goal: Ability to manage health-related needs will improve Outcome: Progressing   Problem: Clinical Measurements: Goal: Ability to maintain clinical measurements within normal limits will improve Outcome: Progressing   Problem: Activity: Goal: Risk for activity intolerance will decrease Outcome: Progressing   Problem: Safety: Goal: Ability to remain free from injury will improve Outcome: Progressing   

## 2021-02-23 NOTE — Discharge Summary (Signed)
Physician Discharge Summary  Patricia Mata AYT:[id number] DOB: July 19, 1955 DOA: 02/20/2021  PCP: [name], FNP  Admit date: 02/20/2021 Discharge date: 02/23/2021  Admitted From: Home Disposition:  Home  Discharge Condition:Stable CODE STATUS:FULL Diet recommendation: renal diet  Brief/Interim Summary:  Patient is a 65 year old female with history of ESRD on dialysis on TTS schedule, stage III colon cancer diagnosed in 2008 status post hemicolectomy currently in remission, MDS requiring blood transfusion, aortic stenosis, diabetes type 2 who presented to the emergency department after she was found to have low hemoglobin during the dialysis session.  She was also complaining of black tarry stools at home.  Denies taking any aspirin or NSAIDs.  Hemoglobin was found to be 5 on admission.  She was transfused with PRBCs.  GI consulted and she underwent enteroscopy and colonoscopy .  Nephrology were following for dialysis.  She is medically stable for discharge today.  Following problems were addressed during her hospitalization:  Symptomatic anemia/black tarry stools: Found to have hemoglobin of 5 on presentation.  Denies taking any NSAIDs or aspirin at home.  She also has history of MDS. She was having black tarry stools at home.  GI bleed suspected. She was transfused a total of 3 units so far.  Hemoglobin stable in the range of 8 today. 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  Treated with APC and 2 clips placement.  Also showed few scattered AVMs in the jejunum treated with APC.  Small duodenal polyp removed with a hot snare.  1 small AVM in the gastric body treated with APC. -Colonoscopy showed fair prep but no evidence of active bleeding. No active bleeding at present Recently had a capsule endoscopy and EGD in February 2022 that did not show any source of bleeding.   ESRD on dialysis: On TTS schedule.  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 Alen Blew, on reblozyl 75 mg [place] injection every 3 weeks.  She gets regular blood transfusion.   Diabetes type 2: Monitor blood sugars at home   Hypertension: Bp stable.  Continue Coreg, hydralazine, clonidine.  Monitor blood pressure.     History of paroxysmal A. fib: Not on anticoagulation.  On normal sinus rhythm at present.     History of aortic stenosis: We will recommend to follow-up with cardiology as an outpatient.      Discharge Diagnoses:  Principal Problem:   Symptomatic anemia Active Problems:   MDS (myelodysplastic syndrome) (HCC)   Essential hypertension   PAF (paroxysmal atrial fibrillation) (HCC)   Hypothyroidism   ESRD (end stage renal disease) ([place])    Discharge Instructions  Discharge Instructions     Diet - low sodium heart healthy   Complete by: As directed    Discharge instructions   Complete by: As directed    1)Please follow up with your PCP in a week, do a CBC test to check your hemoglobin during the follow-up 2) follow-up with gastroenterology as an outpatient.   Increase activity slowly   Complete by: As directed       Allergies as of 02/23/2021       Reactions   Ancef [cefazolin] Itching   Severe itching- after procedure, ancef was the antibiotic.-04/02/17 Tolerates penicillins   Lactose Intolerance (gi) Diarrhea   Morphine And Related Itching   Sulfa Antibiotics Rash, Other (See Comments)   Blisters, also        Medication List  TAKE these medications    allopurinol 300 MG tablet Commonly known as: ZYLOPRIM Take 300 mg by mouth daily.   atorvastatin 40 MG tablet Commonly known as: LIPITOR Take 1 tablet by mouth once daily   Basaglar KwikPen 100 UNIT/ML Inject 15 Units into the skin daily as needed (only if blood sugar over 150). Only if above 150   blood glucose meter kit and supplies Dispense based on patient and insurance preference. Use up to four times  daily as directed. (FOR ICD-10 E10.9, E11.9).   carvedilol 3.125 MG tablet Commonly known as: COREG Take 1 tablet (3.125 mg total) by mouth 2 (two) times daily.   Centrum Silver 50+Women Tabs Take 1 tablet by mouth daily.   cholecalciferol 25 MCG (1000 UNIT) tablet Commonly known as: VITAMIN D3 Take 1,000 Units by mouth daily.   cloNIDine 0.1 MG tablet Commonly known as: Catapres Take 1 tablet (0.1 mg total) by mouth 2 (two) times daily.   Euthyrox 150 MCG tablet Generic drug: levothyroxine TAKE 1 TABLET BY MOUTH ONCE DAILY BEFORE BREAKFAST What changed: how much to take   glucose blood test strip check blood sugars twice daily Dx code E11.22   hydrALAZINE 100 MG tablet Commonly known as: APRESOLINE Take 1 tablet (100 mg total) by mouth 3 (three) times daily.   Insulin Pen Needle 29G X 5MM Misc Use as directed   latanoprost 0.005 % ophthalmic solution Commonly known as: XALATAN Place 1 drop into both eyes at bedtime.   lidocaine-prilocaine cream Commonly known as: EMLA Apply 1 application topically daily as needed (port access).   linagliptin 5 MG Tabs tablet Commonly known as: Tradjenta Take 1 tablet (5 mg total) by mouth daily.   OneTouch Delica Plus [id number] Misc check blood sugars twice daily Dx code: E11.22   OneTouch UltraLink w/Device Kit Check blood sugars twice daily E11.9   polyethylene glycol 17 g packet Commonly known as: MIRALAX / GLYCOLAX Take 17 g by mouth daily as needed for moderate constipation.   sildenafil 20 MG tablet Commonly known as: REVATIO TAKE 1 TABLET BY MOUTH THREE TIMES DAILY       ASK your doctor about these medications    amiodarone 200 MG tablet Commonly known as: PACERONE Take 1 tablet (200 mg total) by mouth daily.        Follow-up Information     [name], FNP. Schedule an appointment as soon as possible for a visit in 1 week(s).   Specialty: General Practice Contact information: [address] [place] [address] [phone]                Allergies  Allergen Reactions   Ancef [Cefazolin] Itching    Severe itching- after procedure, ancef was the antibiotic.-04/02/17 Tolerates penicillins   Lactose Intolerance (Gi) Diarrhea   Morphine And Related Itching   Sulfa Antibiotics Rash and Other (See Comments)    Blisters, also    Consultations: GI,nephrology   Procedures/Studies: DG CHEST PORT 1 VIEW  Result Date: 02/20/2021 CLINICAL DATA:  Chest pain EXAM: PORTABLE CHEST 1 VIEW COMPARISON:  01/13/2021 FINDINGS: Cardiac shadow is mildly prominent but accentuated by the portable technique. Dialysis catheter is noted in satisfactory position. Aortic calcifications are noted. Patient is rotated to the right accentuating the mediastinal markings. No focal infiltrate or effusion is seen. IMPRESSION: No acute abnormality noted. Electronically Signed   By: Inez Catalina M.D.   On: 02/20/2021 23:53      Subjective: Patient seen  and examined the bedside this morning.  Hemodynamically stable for discharge today.  Discharge Exam: Vitals:   02/23/21 0607 02/23/21 1017  BP: (!) 160/49 (!) 153/36  Pulse: (!) 58 61  Resp: 18 17  Temp: 98.4 F (36.9 C) 98.1 F (36.7 C)  SpO2: 100% 100%   Vitals:   02/22/21 2005 02/22/21 2045 02/23/21 0607 02/23/21 1017  BP: (!) 178/53 (!) 176/72 (!) 160/49 (!) 153/36  Pulse: 71 65 (!) 58 61  Resp: '16 17 18 17  ' Temp: 98.6 F (37 C) 98.3 F (36.8 C) 98.4 F (36.9 C) 98.1 F (36.7 C)  TempSrc: Oral Oral Oral Oral  SpO2: 100% 100% 100% 100%  Weight: 86.5 kg     Height:        General: Pt is alert, awake, not in acute distress Cardiovascular: RRR, S1/S2 +, no rubs, no gallops Respiratory: CTA bilaterally, no wheezing, no rhonchi Abdominal: Soft, NT, ND, bowel sounds + Extremities: no edema, no cyanosis    The results of significant diagnostics from this hospitalization (including imaging, microbiology, ancillary and  laboratory) are listed below for reference.     Microbiology: Recent Results (from the past 240 hour(s))  Resp Panel by RT-PCR (Flu A&B, Covid) Nasopharyngeal Swab     Status: None   Collection Time: 02/20/21  7:22 PM   Specimen: Nasopharyngeal Swab; Nasopharyngeal(NP) swabs in vial transport medium  Result Value Ref Range Status   SARS Coronavirus 2 by RT PCR NEGATIVE NEGATIVE Final    Comment: (NOTE) SARS-CoV-2 target nucleic acids are NOT DETECTED.  The SARS-CoV-2 RNA is generally detectable in upper respiratory specimens during the acute phase of infection. The lowest concentration of SARS-CoV-2 viral copies this assay can detect is 138 copies/mL. A negative result does not preclude SARS-Cov-2 infection and should not be used as the sole basis for treatment or other patient management decisions. A negative result may occur with  improper specimen collection/handling, submission of specimen other than nasopharyngeal swab, presence of viral mutation(s) within the areas targeted by this assay, and inadequate number of viral copies(<138 copies/mL). A negative result must be combined with clinical observations, patient history, and epidemiological information. The expected result is Negative.  Fact Sheet for Patients:  EntrepreneurPulse.com.au  Fact Sheet for Healthcare Providers:  IncredibleEmployment.be  This test is no t yet approved or cleared by the [place] FDA and  has been authorized for detection and/or diagnosis of SARS-CoV-2 by FDA under an Emergency Use Authorization (EUA). This EUA will remain  in effect (meaning this test can be used) for the duration of the COVID-19 declaration under Section 564(b)(1) of the Act, 21 U.S.C.section 360bbb-3(b)(1), unless the authorization is terminated  or revoked sooner.       Influenza A by PCR NEGATIVE NEGATIVE Final   Influenza B by PCR NEGATIVE NEGATIVE Final    Comment: (NOTE) The  Xpert Xpress SARS-CoV-2/FLU/RSV plus assay is intended as an aid in the diagnosis of influenza from Nasopharyngeal swab specimens and should not be used as a sole basis for treatment. Nasal washings and aspirates are unacceptable for Xpert Xpress SARS-CoV-2/FLU/RSV testing.  Fact Sheet for Patients: EntrepreneurPulse.com.au  Fact Sheet for Healthcare Providers: IncredibleEmployment.be  This test is not yet approved or cleared by the [place] FDA and has been authorized for detection and/or diagnosis of SARS-CoV-2 by FDA under an Emergency Use Authorization (EUA). This EUA will remain in effect (meaning this test can be used) for the duration of the COVID-19 declaration under Section  564(b)(1) of the Act, 21 U.S.C. section 360bbb-3(b)(1), unless the authorization is terminated or revoked.  Performed at [facility], [place] [address] [place], [place] [address]   MRSA Next Gen by PCR, Nasal     Status: None   Collection Time: 02/21/21  1:57 PM   Specimen: Nasal Mucosa; Nasal Swab  Result Value Ref Range Status   MRSA by PCR Next Gen NOT DETECTED NOT DETECTED Final    Comment: (NOTE) The GeneXpert MRSA Assay (FDA approved for NASAL specimens only), is one component of a comprehensive MRSA colonization surveillance program. It is not intended to diagnose MRSA infection nor to guide or monitor treatment for MRSA infections. Test performance is not FDA approved in patients less than 43 years old. Performed at [facility], [place] [address] [place], [place] [address]      Labs: BNP (last 3 results) Recent Labs    06/07/20 0826 07/06/20 1733 11/21/20 1102  BNP 489.2* 932.6* 2,778.2*   Basic Metabolic Panel: Recent Labs  Lab 02/20/21 1532 02/21/21 0645 02/22/21 0635 02/22/21 1111 02/23/21 0348  NA 139 137 135 135 134*  K 3.1* 3.5 3.6 3.8 3.9  CL 96* 96* 95* 96* 98  CO2 33* 33* 29  --  29  GLUCOSE 136*  155* 114* 111* 176*  BUN 30* 49* 52* 50* 23  CREATININE 2.50* 3.31* 4.08* 4.10* 2.61*  CALCIUM 8.5* 9.1 9.5  --  9.1  PHOS  --   --  4.4  --   --    Liver Function Tests: Recent Labs  Lab 02/22/21 0635  ALBUMIN 2.8*   No results for input(s): LIPASE, AMYLASE in the last 168 hours. No results for input(s): AMMONIA in the last 168 hours. CBC: Recent Labs  Lab 02/20/21 1308 02/20/21 1308 02/20/21 1532 02/21/21 0645 02/22/21 0329 02/22/21 0635 02/22/21 1111 02/22/21 1631 02/23/21 0348  WBC 4.7   < > 5.2 5.3 3.8* 3.9*  --  5.1 4.9  NEUTROABS 3.4  --  3.6  --   --   --   --   --  3.4  HGB 5.9*   < > 5.0* 7.6* 6.2* 6.3* 7.5* 7.8* 8.8*  HCT 17.2*  --  15.3* 22.3* 18.5* 18.7* 22.0* 23.0* 25.8*  MCV 87.8  --  89.5 88.5 88.9 90.3  --  89.1 89.6  PLT 171   < > 184 162 147* 148*  --  155 131*   < > = values in this interval not displayed.   Cardiac Enzymes: No results for input(s): CKTOTAL, CKMB, CKMBINDEX, TROPONINI in the last 168 hours. BNP: Invalid input(s): POCBNP CBG: Recent Labs  Lab 02/22/21 0651 02/22/21 1255 02/22/21 2041 02/23/21 0636 02/23/21 1142  GLUCAP 135* 114* 102* 151* 200*   D-Dimer No results for input(s): DDIMER in the last 72 hours. Hgb A1c No results for input(s): HGBA1C in the last 72 hours. Lipid Profile No results for input(s): CHOL, HDL, LDLCALC, TRIG, CHOLHDL, LDLDIRECT in the last 72 hours. Thyroid function studies No results for input(s): TSH, T4TOTAL, T3FREE, THYROIDAB in the last 72 hours.  Invalid input(s): FREET3 Anemia work up No results for input(s): VITAMINB12, FOLATE, FERRITIN, TIBC, IRON, RETICCTPCT in the last 72 hours. Urinalysis    Component Value Date/Time   COLORURINE AMBER (A) 10/09/2020 2320   APPEARANCEUR CLOUDY (A) 10/09/2020 2320   LABSPEC 1.016 10/09/2020 2320   PHURINE 5.0 10/09/2020 2320   GLUCOSEU NEGATIVE 10/09/2020 2320   HGBUR SMALL (A)  10/09/2020 2320   [place] 10/09/2020 2320   BILIRUBINUR  negative 05/11/2020 [place] 10/09/2020 2320   PROTEINUR 100 (A) 10/09/2020 2320   UROBILINOGEN 0.2 05/11/2020 1152   NITRITE NEGATIVE 10/09/2020 2320   LEUKOCYTESUR MODERATE (A) 10/09/2020 2320   Sepsis Labs Invalid input(s): PROCALCITONIN,  WBC,  LACTICIDVEN Microbiology Recent Results (from the past 240 hour(s))  Resp Panel by RT-PCR (Flu A&B, Covid) Nasopharyngeal Swab     Status: None   Collection Time: 02/20/21  7:22 PM   Specimen: Nasopharyngeal Swab; Nasopharyngeal(NP) swabs in vial transport medium  Result Value Ref Range Status   SARS Coronavirus 2 by RT PCR NEGATIVE NEGATIVE Final    Comment: (NOTE) SARS-CoV-2 target nucleic acids are NOT DETECTED.  The SARS-CoV-2 RNA is generally detectable in upper respiratory specimens during the acute phase of infection. The lowest concentration of SARS-CoV-2 viral copies this assay can detect is 138 copies/mL. A negative result does not preclude SARS-Cov-2 infection and should not be used as the sole basis for treatment or other patient management decisions. A negative result may occur with  improper specimen collection/handling, submission of specimen other than nasopharyngeal swab, presence of viral mutation(s) within the areas targeted by this assay, and inadequate number of viral copies(<138 copies/mL). A negative result must be combined with clinical observations, patient history, and epidemiological information. The expected result is Negative.  Fact Sheet for Patients:  EntrepreneurPulse.com.au  Fact Sheet for Healthcare Providers:  IncredibleEmployment.be  This test is no t yet approved or cleared by the [place] FDA and  has been authorized for detection and/or diagnosis of SARS-CoV-2 by FDA under an Emergency Use Authorization (EUA). This EUA will remain  in effect (meaning this test can be used) for the duration of the COVID-19 declaration under Section  564(b)(1) of the Act, 21 U.S.C.section 360bbb-3(b)(1), unless the authorization is terminated  or revoked sooner.       Influenza A by PCR NEGATIVE NEGATIVE Final   Influenza B by PCR NEGATIVE NEGATIVE Final    Comment: (NOTE) The Xpert Xpress SARS-CoV-2/FLU/RSV plus assay is intended as an aid in the diagnosis of influenza from Nasopharyngeal swab specimens and should not be used as a sole basis for treatment. Nasal washings and aspirates are unacceptable for Xpert Xpress SARS-CoV-2/FLU/RSV testing.  Fact Sheet for Patients: EntrepreneurPulse.com.au  Fact Sheet for Healthcare Providers: IncredibleEmployment.be  This test is not yet approved or cleared by the [place] FDA and has been authorized for detection and/or diagnosis of SARS-CoV-2 by FDA under an Emergency Use Authorization (EUA). This EUA will remain in effect (meaning this test can be used) for the duration of the COVID-19 declaration under Section 564(b)(1) of the Act, 21 U.S.C. section 360bbb-3(b)(1), unless the authorization is terminated or revoked.  Performed at [facility], [place] [address] [place], [place] [address]   MRSA Next Gen by PCR, Nasal     Status: None   Collection Time: 02/21/21  1:57 PM   Specimen: Nasal Mucosa; Nasal Swab  Result Value Ref Range Status   MRSA by PCR Next Gen NOT DETECTED NOT DETECTED Final    Comment: (NOTE) The GeneXpert MRSA Assay (FDA approved for NASAL specimens only), is one component of a comprehensive MRSA colonization surveillance program. It is not intended to diagnose MRSA infection nor to guide or monitor treatment for MRSA infections. Test performance is not FDA approved in patients less than 102 years old. Performed at [facility], [address] [place], [place] [address]     Please note: You were cared for by a hospitalist during your hospital stay. Once you are discharged, your primary care  physician will handle any further medical issues. Please note that NO REFILLS for any discharge medications will be authorized once you are discharged, as it is imperative that you return to your primary care physician (or establish a relationship with a primary care physician if you do not have one) for your post hospital discharge needs so that they can reassess your need for medications and monitor your lab values.    Time coordinating discharge: 40 minutes  SIGNED:   Shelly Coss, MD  [facility] 02/23/2021, 2:00 PM Pager [phone]  If 7PM-7AM, please contact night-coverage [email or web address] Password [email or web address]

## 2021-02-23 NOTE — Progress Notes (Signed)
DISCHARGE NOTE HOME Patricia Mata to be discharged Home per MD order. Discussed prescriptions and follow up appointments with the patient. Prescriptions given to patient; medication list explained in detail. Patient verbalized understanding.  Skin clean, dry and intact without evidence of skin break down, no evidence of skin tears noted. IV catheter discontinued intact. Site without signs and symptoms of complications. Dressing and pressure applied. Pt denies pain at the site currently. No complaints noted.  Patient free of lines, drains, and wounds.   An After Visit Summary (AVS) was printed and given to the patient. Patient escorted via wheelchair, and discharged home via private auto.  Vira Agar, RN

## 2021-02-23 NOTE — Plan of Care (Signed)
  Problem: Health Behavior/Discharge Planning: Goal: Ability to manage health-related needs will improve 02/23/2021 1448 by Vira Agar, RN Outcome: Completed/Met 02/23/2021 1347 by Vira Agar, RN Outcome: Progressing   Problem: Clinical Measurements: Goal: Ability to maintain clinical measurements within normal limits will improve 02/23/2021 1448 by Vira Agar, RN Outcome: Completed/Met 02/23/2021 1347 by Vira Agar, RN Outcome: Progressing Goal: Will remain free from infection Outcome: Completed/Met Goal: Diagnostic test results will improve Outcome: Completed/Met Goal: Respiratory complications will improve Outcome: Completed/Met Goal: Cardiovascular complication will be avoided Outcome: Completed/Met   Problem: Activity: Goal: Risk for activity intolerance will decrease 02/23/2021 1448 by Vira Agar, RN Outcome: Completed/Met 02/23/2021 1347 by Vira Agar, RN Outcome: Progressing   Problem: Coping: Goal: Level of anxiety will decrease Outcome: Completed/Met   Problem: Safety: Goal: Ability to remain free from injury will improve 02/23/2021 1448 by Vira Agar, RN Outcome: Completed/Met 02/23/2021 1347 by Vira Agar, RN Outcome: Progressing

## 2021-02-23 NOTE — Progress Notes (Addendum)
[facility] Progress Note   Subjective:    Patient seen and examined at bedside. No complaints/concerns. Tolerated HD 8/11 with UF 3.5L. She is ready to go home. Per patient, plan for discharge today-okay for discharge from renal standpoint.  Objective Vitals:   02/22/21 2005 02/22/21 2045 02/23/21 0607 02/23/21 1017  BP: (!) 178/53 (!) 176/72 (!) 160/49 (!) 153/36  Pulse: 71 65 (!) 58 61  Resp: 16 17 18 17   Temp: 98.6 F (37 C) 98.3 F (36.8 C) 98.4 F (36.9 C) 98.1 F (36.7 C)  TempSrc: Oral Oral Oral Oral  SpO2: 100% 100% 100% 100%  Weight: 86.5 kg     Height:       Physical Exam General:Well-appearing female; NAD Heart:Normal S1 and S2; No murmurs, gallops, or rubs Lungs: Clear throughout; No wheezing, rales, or rhonchi Abdomen: Soft, non-tender, (+) BS Extremities:No edema BLLE Dialysis Access: R AVF (+) Bruit/Thrill   Filed Weights   02/21/21 1344 02/22/21 1615 02/22/21 2005  Weight: 89.3 kg 90 kg 86.5 kg    Intake/Output Summary (Last 24 hours) at 02/23/2021 1334 Last data filed at 02/22/2021 2005 Gross per 24 hour  Intake 315 ml  Output 3500 ml  Net -3185 ml    Additional Objective Labs: Basic Metabolic Panel: Recent Labs  Lab 02/21/21 0645 02/22/21 0635 02/22/21 1111 02/23/21 0348  NA 137 135 135 134*  K 3.5 3.6 3.8 3.9  CL 96* 95* 96* 98  CO2 33* 29  --  29  GLUCOSE 155* 114* 111* 176*  BUN 49* 52* 50* 23  CREATININE 3.31* 4.08* 4.10* 2.61*  CALCIUM 9.1 9.5  --  9.1  PHOS  --  4.4  --   --    Liver Function Tests: Recent Labs  Lab 02/22/21 0635  ALBUMIN 2.8*   No results for input(s): LIPASE, AMYLASE in the last 168 hours. CBC: Recent Labs  Lab 02/20/21 1308 02/20/21 1308 02/20/21 1532 02/21/21 0645 02/22/21 0329 02/22/21 0635 02/22/21 1111 02/22/21 1631 02/23/21 0348  WBC 4.7   < > 5.2 5.3 3.8* 3.9*  --  5.1 4.9  NEUTROABS 3.4  --  3.6  --   --   --   --   --  3.4  HGB 5.9*   < > 5.0* 7.6* 6.2* 6.3* 7.5* 7.8*  8.8*  HCT 17.2*  --  15.3* 22.3* 18.5* 18.7* 22.0* 23.0* 25.8*  MCV 87.8  --  89.5 88.5 88.9 90.3  --  89.1 89.6  PLT 171   < > 184 162 147* 148*  --  155 131*   < > = values in this interval not displayed.   Blood Culture    Component Value Date/Time   SDES  09/24/2019 1144    TRACHEAL ASPIRATE Performed at [facility], [place] [address] [place], [place] [address]    SPECREQUEST  09/24/2019 1144    NONE Performed at [facility], [place] [address], [place], [place] [address]    CULT FEW CANDIDA ALBICANS 09/24/2019 1144   REPTSTATUS 09/27/2019 FINAL 09/24/2019 1144    Cardiac Enzymes: No results for input(s): CKTOTAL, CKMB, CKMBINDEX, TROPONINI in the last 168 hours. CBG: Recent Labs  Lab 02/22/21 0651 02/22/21 1255 02/22/21 2041 02/23/21 0636 02/23/21 1142  GLUCAP 135* 114* 102* 151* 200*   Iron Studies: No results for input(s): IRON, TIBC, TRANSFERRIN, FERRITIN in the last 72 hours. Lab Results  Component Value Date   INR 1.0 02/20/2021  INR 1.0 11/21/2020   INR 1.4 (H) 10/10/2020   Studies/Results: No results found.  Medications:   (feeding supplement) PROSource Plus  30 mL Oral BID BM   sodium chloride   Intravenous Once   sodium chloride   Intravenous Once   allopurinol  300 mg Oral Daily   atorvastatin  40 mg Oral Daily   carvedilol  3.125 mg Oral BID   Chlorhexidine Gluconate Cloth  6 each Topical Daily   cloNIDine  0.1 mg Oral BID   feeding supplement (NEPRO CARB STEADY)  237 mL Oral BID BM   hydrALAZINE  100 mg Oral TID   insulin aspart  0-6 Units Subcutaneous TID WC   latanoprost  1 drop Both Eyes QHS   levothyroxine  150 mcg Oral QAC breakfast   multivitamin  1 tablet Oral QHS   sildenafil  20 mg Oral TID    Dialysis Orders: [facility] T,Th,S 4 hrs 180NRe 400/autoflow 1.5 2.0K/2.0 Ca AVF/TDC -Heparin 2000 units IV TIW -Mircera 225 mcg IV q 2 weeks (last dose 02/08/2021) -Calcitriol 0.25 mcg PO  TIW  Assessment/Plan:  Symptomatic Anemia/Tarry Stools: HGB 5.0 on admission. S/P 4 units PRBCs for this admission. Went for endoscopy/colonoscopy this AM. Few diverticula found in sigmoid colon, internal hemorrhoids. A single angioectasia with stigmata of recent bleeding was found in the second portion of the duodenum. Lesion ablated. Hgb now 8.8. Per GI/Primary MDS-follows with Dr. Alen Blew.   ESRD -  T,Th,S. Tolerated HD 8/11 with UF 3.5L. Plan for dc today-will resume HD as outpatient per her usual schedule 02/24/21.  Hypertension/volume  - Bps elevated, continue home HTN medications. Patient currently euvolemic on exam. Anemia  - As noted above. Metabolic bone disease -  Labs at goal. Continue VDRA. No binders on OP Med list.  Nutrition - Soft diet at present. Renal Carb mod diet when diet advanced. Albumin low. Add protein supps, renal vits. Pulmonary HTN- On sildenafil. Follows with Dr. Haroldine Laws. Per primary.  Severe AS/Moderate MS-follows with cardiology DMT2-per primary Disposition- Patient for possible DC today-okay for discharge from renal standpoint-will resume HD as outpatient on her usual schedule 02/24/21. If she doesn't leave today-can leave after HD tomorrow 02/24/21.  Patricia Poet, NP Patricia Mata 02/23/2021,1:34 PM  LOS: 1 day

## 2021-02-23 NOTE — Telephone Encounter (Signed)
Patient was discharged from the hospital 02/23/21 around 3:00 pm. The patient missed a test scheduled for 02/23/21 at 9:00 am. The patient wanted to know if the test was done while she was in the hospital or not. If the test was not done, she would like to know if she needs to re-schedule the test.  The patient was marked as a now-show, but the patient was hospitalized at the time of the test.   Please let the patient know what she needs to do

## 2021-02-25 NOTE — TOC Transition Note (Signed)
Transition of care contact from inpatient facility  Date of discharge: 02/23/21 Date of contact: 02/25/21 Method: Phone Spoke to: Patient  Patient contacted to discuss transition of care from recent inpatient hospitalization. Patient was admitted to [facility] from 8/9-8/12/22 with discharge diagnosis of symptomatic anemia s/p Enteroscopy/Colonoscopy.  Medication changes were reviewed. Patient denied needing any medication re-fills. She reports usually her PCP calling her to make a f/u appointment.  Plan for renal team to follow-up with her during her next outpatient HD on 02/27/21 at [facility].  Tobie Poet, NP

## 2021-02-26 ENCOUNTER — Inpatient Hospital Stay: Payer: Medicare Other

## 2021-02-26 ENCOUNTER — Other Ambulatory Visit: Payer: Self-pay

## 2021-02-26 ENCOUNTER — Other Ambulatory Visit: Payer: Self-pay | Admitting: *Deleted

## 2021-02-26 DIAGNOSIS — D649 Anemia, unspecified: Secondary | ICD-10-CM

## 2021-02-26 DIAGNOSIS — D469 Myelodysplastic syndrome, unspecified: Secondary | ICD-10-CM

## 2021-02-26 DIAGNOSIS — N189 Chronic kidney disease, unspecified: Secondary | ICD-10-CM | POA: Diagnosis not present

## 2021-02-26 DIAGNOSIS — D631 Anemia in chronic kidney disease: Secondary | ICD-10-CM

## 2021-02-26 DIAGNOSIS — D500 Iron deficiency anemia secondary to blood loss (chronic): Secondary | ICD-10-CM

## 2021-02-26 LAB — CBC WITH DIFFERENTIAL (CANCER CENTER ONLY)
Abs Immature Granulocytes: 0.02 10*3/uL (ref 0.00–0.07)
Basophils Absolute: 0 10*3/uL (ref 0.0–0.1)
Basophils Relative: 0 %
Eosinophils Absolute: 0.1 10*3/uL (ref 0.0–0.5)
Eosinophils Relative: 1 %
HCT: 24.1 % — ABNORMAL LOW (ref 36.0–46.0)
Hemoglobin: 8.4 g/dL — ABNORMAL LOW (ref 12.0–15.0)
Immature Granulocytes: 0 %
Lymphocytes Relative: 16 %
Lymphs Abs: 1.1 10*3/uL (ref 0.7–4.0)
MCH: 30.7 pg (ref 26.0–34.0)
MCHC: 34.9 g/dL (ref 30.0–36.0)
MCV: 88 fL (ref 80.0–100.0)
Monocytes Absolute: 1.1 10*3/uL — ABNORMAL HIGH (ref 0.1–1.0)
Monocytes Relative: 16 %
Neutro Abs: 4.5 10*3/uL (ref 1.7–7.7)
Neutrophils Relative %: 67 %
Platelet Count: 188 10*3/uL (ref 150–400)
RBC: 2.74 MIL/uL — ABNORMAL LOW (ref 3.87–5.11)
RDW: 16.4 % — ABNORMAL HIGH (ref 11.5–15.5)
WBC Count: 6.7 10*3/uL (ref 4.0–10.5)
nRBC: 0.3 % — ABNORMAL HIGH (ref 0.0–0.2)

## 2021-02-26 LAB — PREPARE RBC (CROSSMATCH)

## 2021-02-26 LAB — SAMPLE TO BLOOD BANK

## 2021-02-26 MED ORDER — DIPHENHYDRAMINE HCL 25 MG PO CAPS
50.0000 mg | ORAL_CAPSULE | Freq: Once | ORAL | Status: AC
  Administered 2021-02-26: 50 mg via ORAL

## 2021-02-26 MED ORDER — ACETAMINOPHEN 325 MG PO TABS
650.0000 mg | ORAL_TABLET | Freq: Once | ORAL | Status: AC
  Administered 2021-02-26: 650 mg via ORAL

## 2021-02-26 MED ORDER — SODIUM CHLORIDE 0.9% IV SOLUTION
250.0000 mL | Freq: Once | INTRAVENOUS | Status: AC
  Administered 2021-02-26: 250 mL via INTRAVENOUS

## 2021-02-26 NOTE — Patient Instructions (Signed)
Blood Transfusion, Adult, Care After This sheet gives you information about how to care for yourself after your procedure. Your doctor may also give you more specific instructions. If youhave problems or questions, contact your doctor. What can I expect after the procedure? After the procedure, it is common to have: Bruising and soreness at the IV site. A fever or chills on the day of the procedure. This may be your body's response to the new blood cells received. A headache. Follow these instructions at home: Insertion site care     Follow instructions from your doctor about how to take care of your insertion site. This is where an IV tube was put into your vein. Make sure you: Wash your hands with soap and water before and after you change your bandage (dressing). If you cannot use soap and water, use hand sanitizer. Change your bandage as told by your doctor. Check your insertion site every day for signs of infection. Check for: Redness, swelling, or pain. Bleeding from the site. Warmth. Pus or a bad smell. General instructions Take over-the-counter and prescription medicines only as told by your doctor. Rest as told by your doctor. Go back to your normal activities as told by your doctor. Keep all follow-up visits as told by your doctor. This is important. Contact a doctor if: You have itching or red, swollen areas of skin (hives). You feel worried or nervous (anxious). You feel weak after doing your normal activities. You have redness, swelling, warmth, or pain around the insertion site. You have blood coming from the insertion site, and the blood does not stop with pressure. You have pus or a bad smell coming from the insertion site. Get help right away if: You have signs of a serious reaction. This may be coming from an allergy or the body's defense system (immune system). Signs include: Trouble breathing or shortness of breath. Swelling of the face or feeling warm  (flushed). Fever or chills. Head, chest, or back pain. Dark pee (urine) or blood in the pee. Widespread rash. Fast heartbeat. Feeling dizzy or light-headed. You may receive your blood transfusion in an outpatient setting. If so, youwill be told whom to contact to report any reactions. These symptoms may be an emergency. Do not wait to see if the symptoms will go away. Get medical help right away. Call your local emergency services (911 in the [place] Do not drive yourself to the hospital. Summary Bruising and soreness at the IV site are common. Check your insertion site every day for signs of infection. Rest as told by your doctor. Go back to your normal activities as told by your doctor. Get help right away if you have signs of a serious reaction. This information is not intended to replace advice given to you by your health care provider. Make sure you discuss any questions you have with your healthcare provider. Document Revised: 12/24/2018 Document Reviewed: 12/24/2018 Elsevier Patient Education  2022 [employer] Inc[employer]  

## 2021-02-27 ENCOUNTER — Telehealth: Payer: Self-pay

## 2021-02-27 ENCOUNTER — Encounter (HOSPITAL_COMMUNITY): Payer: Medicare Other

## 2021-02-27 LAB — TYPE AND SCREEN
ABO/RH(D): A NEG
Antibody Screen: NEGATIVE
Unit division: 0

## 2021-02-27 LAB — BPAM RBC
Blood Product Expiration Date: 202208302359
ISSUE DATE / TIME: 202208151401
Unit Type and Rh: 600

## 2021-02-27 NOTE — Telephone Encounter (Signed)
Transition Care Management Unsuccessful Follow-up Telephone Call  Date of discharge and from where:  [facility] 02/23/21  Attempts:  1st Attempt  Reason for unsuccessful TCM follow-up call:  Left voice message   I spoke with patient she stated she was in dialysis and requested that I call her back after 1pm. YL,RMA

## 2021-02-27 NOTE — Progress Notes (Signed)
Advanced Heart Failure Clinic Note   PCP: [name], FNP PCP-Cardiologist: Quay Burow, MD  Hematologist: Dr. Alen Blew AHF: Dr. Haroldine Laws  Nephrology: Dr Resa Miner Structural Heart Team: Dr Burt Knack.   HPI: Patricia Mata is a 65 y.o. 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    Admitted 10/19  with AF with RVR and recurrent anemia. Developed respiratory failure and intubated. She was transfused and placed on IV amio. Subsequently developed AKI and placed on CVVHD. Converted to NSR on amio and weaned off the vent.Echo showed normal LVEF 65-70% with moderate AS and moderate MS and severe PH with RVSP 72mHG. She is not a candidate for intervention of AS or MS with advanced PAH and transfusion-dependent myelodysplasia as a result of her colon CA    [facility] showed normal coronaries and severely elevated PA pressures.-> V/Q scan was negative. She was started on sildenafil and tolerated well. She diuresed with IV lasix and transitioned to lasix 40 mg PO BID. Palliative care was consulted and decision was made to transition to DNR.   She follows with Dr. SAlen Blewand requires transfusions every 2-3 weeks as an outpatient. She also developed a buttock wound. WOC was consulted and recommended hydrotherapy for coccygeal osteo   [facility]   Had [facility] that showed mild PAH in setting of high-output (likely due to anemia). See data below  Admitted for CAP 3/21 and treated w/ abx. Returned to hospital 3 days later for worsening SOB and admitted for acute hypoxic respiratory failure requiring intubation. Chest CT showed multifocal PNA COVID negative..A/C CHF also felt to be contributing.   Admitted 11/21 for acute on chronic CHF. Echo 11/21 EF 60-65% G2DD severe AS mean 31mG AVA 0.84cm2. Moderate MS    Admitted 12/21 for recurrent volume overload and AKI. Cr 6.45. (baseline 2.8-3.5). Diuresed. Creatinine improved to 4.9 on d/c. Weight 221. Dr. GoMoshe Ciprouggested HD but patient refused.   Admitted 10/09/20 with NSTEMI. Hospital course complicated by AKI>>ESRD. Had RHC/LHC with significant CAD with 60% mid LM disease but otherwise non-obstructive CAD. Structural Heart evaluated and there was concern anatomy would make TAVR difficult. Structural Heart team will re-evaluate after she starts HD. During this hospitalization HD was started.   Admitted 11/21/20 with chest pain. Missed HD appointment. Cardiology/Nephrology consulted. HS trop not elevated. Hgb was down to 7.5. Given unit of blood. Had HD and discharged on 11/23/20.   Today she returns for HF follow up.Overall feeling fine. Started dialysis about 4 weeks ago. Tolerating HD-  T-Thur/Sat. Denies SOB/PND/Orthopnea. No chest pain. No dizziness or syncope. Uses an electric cart in the grocery store. No chest pain. Appetite ok. No fever or chills. Weight at home 198-202 pounds. Taking all medications. Nephrology stopped coreg.   Evaluated in ED 6/22 for anemia. Given 2 U PRBCs and discharged. Admitted 7/22 for anemia and HD after missing dialysis outpatient. Received 2 units PRBCs and was dialyzed.   Admitted 8/22 for anemia, hgb 5.0. GI consulted and she underwent enteroscopy showing AVM treated with APC and 2 clips.  Colonoscopy with no evidence of active bleeding. She was discharged on home dose of HF medications.  Today she returns for post hospitalization HF follow up with her husband. Overall feeling fine. Some dyspnea with increased physical activity. Tolerating HD well. Has been using fistula and  will have tunneled catheter removed soon. Denies CP, dizziness, edema, or PND/Orthopnea. Appetite ok. No fever or chills. Dry weight at HD is 88 kg. Taking all medications. Requiring blood transfusions every 2-3 weeks now.  Cardiac  studies: RHC/LHC 10/13/20  Mid RCA lesion is 20% stenosed. Mid LM lesion is 60% stenosed. Ost Cx to Prox Cx lesion is 30% stenosed. Mid LAD lesion is 30% stenosed  Ao = 152/38 (76) LV =  186/25 RA =  6 RV = 76/11 PA = 74/19 (43) PCW = unable to obtain Fick cardiac output/index = 9.1/4.6 PVR = 2.0 (using LVEDP) Ao sat = 96% PA sat = 67%. 67%  AoV: Crossed easily with JR4 and J-wire. Peak-to-peak gradient 65mHg  Mean gradient 328mG AVA 1.4cm2 Assessment:  1. Significant CAD with 60% mid LM disease. O/w non-obstructive CAD 2. EF 60% 3. Moderate to severe pulmonary HTN due to high output 4. Moderate to severe AS Results d/w TAVR team. Given unfavorable anatomy, burden of comorbidites and need for alternative access, I suspect risks of TAVR outweigh possible benefits. Favor medical management.  RHMilroy0/20 RA = 8 RV = 53/3 PA = 57/12 (32) PCW = 14 (v waves to 32) Fick cardiac output/index = 8.4/4.5 PVR = 2.1 WU Ao sat = 99% PA sat = 73%, 74% High SVC sat = 74%  RHC 11/19 RA  14 RV  110/15 PA 105/40 (62) PCWP 25 Ao sat 91% PA sat 53% Fick CO/CI  6.8/4.3  PVR 5.4  AoV  Mean 2628m AVA 1.3 cm2  PFTs 8/20 FEV1 2.24 (112%) FVC 2.63 (103%) DLCO 56%  Echo 02/15/19 EF 65% RV normal size/function. Septal flattening. Severe AS (mean 41m86m RVSP 64mm27m  Echo 11/21 EF 60-65% G2DD severe AS mean 35mmH24mA 0.84cm2. Moderate MS RVSP 54mmHG78meview of systems complete and found to be negative unless listed in HPI.    Past Medical History:  Diagnosis Date   Acute renal failure (ARF) (HCC) [place]   [facility] ([facility]  [facility])    Colon cancer (HCC) 20[place]  Diabetes mellitus (HCC)   [place]    Gout 09/08/2018   Heart attack (HCC)   [place]    Hypertension    Hypothyroidism    Macrocytic anemia    MDS (myelodysplastic syndrome) (HCC)    Moderate mitral stenosis    PAD (peripheral  artery disease) (HCC)    PAF (paroxysmal atrial fibrillation) (HCC)    Pulmonary hypertension (HCC)    Renal artery stenosis (HCC)    a. >60% R renal artery stenosis by duplex 2021, managed medically.   Severe aortic stenosis    Stage 4 chronic kidney disease (HCC)    Transfusion-dependent anemia     Current Outpatient Medications  Medication Sig Dispense Refill   allopurinol (ZYLOPRIM) 300 MG tablet Take 300 mg by mouth daily.     amiodarone (PACERONE) 200 MG tablet Take 1 tablet (200 mg total) by mouth daily. 90 tablet 3   atorvastatin (LIPITOR) 40 MG tablet Take 1 tablet by mouth once daily 90 tablet 1   blood glucose meter kit and supplies Dispense based on patient and insurance preference. Use up to four times daily as directed. (FOR ICD-10 E10.9, E11.9). 1 each 3   Blood Glucose Monitoring Suppl (ONETOUCH ULTRALINK) w/Device KIT Check blood sugars twice daily E11.9 1 kit 0   carvedilol (COREG) 3.125 MG tablet Take  1 tablet (3.125 mg total) by mouth 2 (two) times daily. 60 tablet 3   cholecalciferol (VITAMIN D3) 25 MCG (1000 UNIT) tablet Take 1,000 Units by mouth daily.     cloNIDine (CATAPRES) 0.1 MG tablet Take 1 tablet (0.1 mg total) by mouth 2 (two) times daily. 60 tablet 11   EUTHYROX 150 MCG tablet TAKE 1 TABLET BY MOUTH ONCE DAILY BEFORE BREAKFAST 30 tablet 0   glucose blood test strip check blood sugars twice daily Dx code E11.22 100 each 3   hydrALAZINE (APRESOLINE) 100 MG tablet Take 1 tablet (100 mg total) by mouth 3 (three) times daily. 90 tablet 11   Insulin Glargine (BASAGLAR KWIKPEN) 100 UNIT/ML Inject 15 Units into the skin daily as needed (only if blood sugar over 150). Only if above 150     Insulin Pen Needle 29G X 5MM MISC Use as directed 200 each 0   Lancets (ONETOUCH DELICA PLUS [id number]) MISC check blood sugars twice daily Dx code: E11.22 100 each 3   latanoprost (XALATAN) 0.005 % ophthalmic solution Place 1 drop into both eyes at bedtime.       lidocaine-prilocaine (EMLA) cream Apply 1 application topically daily as needed (port access).     linagliptin (TRADJENTA) 5 MG TABS tablet Take 1 tablet (5 mg total) by mouth daily. 90 tablet 1   Multiple Vitamins-Minerals (CENTRUM SILVER 50+WOMEN) TABS Take 1 tablet by mouth daily.     polyethylene glycol (MIRALAX / GLYCOLAX) 17 g packet Take 17 g by mouth daily as needed for moderate constipation.     sildenafil (REVATIO) 20 MG tablet TAKE 1 TABLET BY MOUTH THREE TIMES DAILY 90 tablet 4   No current facility-administered medications for this encounter.   Allergies  Allergen Reactions   Ancef [Cefazolin] Itching    Severe itching- after procedure, ancef was the antibiotic.-04/02/17 Tolerates penicillins   Lactose Intolerance (Gi) Diarrhea   Morphine And Related Itching   Sulfa Antibiotics Rash and Other (See Comments)    Blisters, also   Social History   Socioeconomic History   Marital status: Married    Spouse name: Not on file   Number of children: Not on file   Years of education: Not on file   Highest education level: Not on file  Occupational History   Occupation: retired  Tobacco Use   Smoking status: Former    Packs/day: 0.25    Years: 20.00    Pack years: 5.00    Types: Cigarettes    Quit date: 1997    Years since quitting: 25.6   Smokeless tobacco: Former  Scientific laboratory technician Use: Never used  Substance and Sexual Activity   Alcohol use: Yes    Alcohol/week: 1.0 standard drink    Types: 1 Standard drinks or equivalent per week    Comment: occ   Drug use: Never   Sexual activity: Not on file  Other Topics Concern   Not on file  Social History Narrative   Not on file   Social Determinants of Health   Financial Resource Strain: Low Risk    Difficulty of Paying Living Expenses: Not hard at all  Food Insecurity: No Food Insecurity   Worried About Charity fundraiser in the Last Year: Never true   [place] in the Last Year: Never true   Transportation Needs: No Transportation Needs   Lack of Transportation (Medical): No   Lack of Transportation (Non-Medical): No  Physical Activity: Inactive  Days of Exercise per Week: 0 days   Minutes of Exercise per Session: 0 min  Stress: No Stress Concern Present   Feeling of Stress : Not at all  Social Connections: Not on file  Intimate Partner Violence: Not on file   Family History  Problem Relation Age of Onset   Hypertension Mother    Diabetes Mother    Cervical cancer Mother    Heart attack Father    Hypertension Sister    Hypertension Brother    Hypertension Sister    Hypertension Sister    Prostate cancer Brother    HIV/AIDS Brother    BP (!) 130/57   Pulse 64   Wt 89.7 kg (197 lb 12.8 oz)   SpO2 100%   BMI 33.95 kg/m   Wt Readings from Last 3 Encounters:  02/28/21 89.7 kg (197 lb 12.8 oz)  02/22/21 86.5 kg (190 lb 11.2 oz)  01/31/21 89.2 kg (196 lb 9.6 oz)    PHYSICAL EXAM: General:  NAD. No resp difficulty, arrived in [facility] HEENT: Normal Neck: Supple. No JVD. Carotids 2+ bilat; no bruits. No lymphadenopathy or thryomegaly appreciated. Cor: PMI nondisplaced. Regular rate & rhythm. No rubs, gallops, II/VI AS Lungs: Clear, tunneled catheter R upper chest Abdomen: Soft, nontender, nondistended. No hepatosplenomegaly. No bruits or masses. Good bowel sounds. Extremities: No cyanosis, clubbing, rash, edema; RUE fistula +/+ Neuro: Alert & oriented x 3, cranial nerves grossly intact. Moves all 4 extremities w/o difficulty. Affect pleasant.  ASSESSMENT & PLAN:  1. PAH - Echo 05/12/18 LVEF 65-70%, Grade 2 DD, Mod AS, Mild/Mod MS, Mod TR, Trivial PI, PA peak pressure 93.  - L/RHC 05/21/18 with no significant CAD, severe pulmonary HTN with PA 105/40 (62) PCW 33 PVR 5.4 WU - Suspect multifactorial in nature suspect including high output due to anemia, WHO group II (diastolic HF and valvular disease) and III (OSA) but given degree of PH cannot exclude WHO Group I  component. - Repeat RHC 04/23/19 with much improved pulmonary pressures. PA 57/12 (32) PCWP 14 (v to 32) - Echo 11/21 EF 60-65% G2DD severe AS mean 98mHG AVA 0.84cm2. Moderate MS RVSP 53mG - VQ negative. - PFTs 8/20 with normal spirometry and moderately reduced DLCO. - Will arrange for home sleep study. - Continue sildenafil 20 mg TID cautiously (has AS).   2. Chronic diastolic HF - Echo 8/7/61F 65% with normal RV size and function. + septal flattening. RVSP 64 mmHG. - Echo 11/21 EF 60-65% G2DD severe AS mean 353m AVA 0.84cm2. Moderate MS RVSP 54 mmHG. - Echo 3/22 EF 50-55%, RV mildly reduced, degenerative mitral valve, moderate MS - Multiple hospitalizations for ADHF/AKI. - NYHA II. Volume managed on HD T/TH/Sat. Volume good today. - Continue carvedilol 3.125 mg bid.  3. Severe AS/Moderate MS - Echos 8/20 and again 3/21 with severe AS  - Echo 11/21 EF 60-65% G2DD severe AS mean 21m7mAVA 0.84cm2. Moderate MS RVSP 54mm73m She has seen Dr. ShadaAlen Blewhe has made it clear that she has a terminal illness but he would have no issue with using Plavix if needed - Had cath 10/2020 AoV: Crossed easily with JR4 and J-wire. Peak-to-peak gradient 36mmH38mean gradient 33mmHG29m 1.4cm2. Anatomy may not be suitable for TAVR.  - Given anatomy and co-morbidities, risk of TAVR outweigh benefit. Manage medically.   4. PAF - Regular on exam.  - Continue amiodarone 200 daily. TSH ok 7/22. CMET today. - Continue to avoid anticoagulation with transfusion  dependent anemia. (chronic).  - CHA2DS2/VASc is at least 4.    5. ESRD - Tolerating HD well T/Th/Sat .  6. 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 - Transfuse as needed per [place].    7. HTN - Stable today. -.Continue hydralazine 100 mg tid. - Continue clonidine 0.1 mg bid.  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 - No further issues.   Follow up in 2-3 months with Dr  Haroldine Laws.   [place], FNP 02/28/21

## 2021-02-28 ENCOUNTER — Encounter (HOSPITAL_COMMUNITY): Payer: Self-pay

## 2021-02-28 ENCOUNTER — Other Ambulatory Visit: Payer: Self-pay

## 2021-02-28 ENCOUNTER — Ambulatory Visit (HOSPITAL_COMMUNITY)
Admission: RE | Admit: 2021-02-28 | Discharge: 2021-02-28 | Disposition: A | Discharge status: Home / Self Care | Payer: Medicare Other | Source: Ambulatory Visit | Attending: Family Medicine | Admitting: Family Medicine

## 2021-02-28 VITALS — BP 130/57 | HR 64 | Wt 197.8 lb

## 2021-02-28 DIAGNOSIS — Z8249 Family history of ischemic heart disease and other diseases of the circulatory system: Secondary | ICD-10-CM | POA: Insufficient documentation

## 2021-02-28 DIAGNOSIS — I5032 Chronic diastolic (congestive) heart failure: Secondary | ICD-10-CM | POA: Diagnosis present

## 2021-02-28 DIAGNOSIS — Z66 Do not resuscitate: Secondary | ICD-10-CM | POA: Diagnosis not present

## 2021-02-28 DIAGNOSIS — M868X8 Other osteomyelitis, other site: Secondary | ICD-10-CM | POA: Diagnosis not present

## 2021-02-28 DIAGNOSIS — Z881 Allergy status to other antibiotic agents status: Secondary | ICD-10-CM | POA: Diagnosis not present

## 2021-02-28 DIAGNOSIS — I272 Pulmonary hypertension, unspecified: Secondary | ICD-10-CM | POA: Diagnosis not present

## 2021-02-28 DIAGNOSIS — D649 Anemia, unspecified: Secondary | ICD-10-CM

## 2021-02-28 DIAGNOSIS — Z79899 Other long term (current) drug therapy: Secondary | ICD-10-CM | POA: Diagnosis not present

## 2021-02-28 DIAGNOSIS — Z87891 Personal history of nicotine dependence: Secondary | ICD-10-CM | POA: Insufficient documentation

## 2021-02-28 DIAGNOSIS — I350 Nonrheumatic aortic (valve) stenosis: Secondary | ICD-10-CM | POA: Diagnosis not present

## 2021-02-28 DIAGNOSIS — I132 Hypertensive heart and chronic kidney disease with heart failure and with stage 5 chronic kidney disease, or end stage renal disease: Secondary | ICD-10-CM | POA: Diagnosis not present

## 2021-02-28 DIAGNOSIS — I252 Old myocardial infarction: Secondary | ICD-10-CM | POA: Insufficient documentation

## 2021-02-28 DIAGNOSIS — I2721 Secondary pulmonary arterial hypertension: Secondary | ICD-10-CM

## 2021-02-28 DIAGNOSIS — Z882 Allergy status to sulfonamides status: Secondary | ICD-10-CM | POA: Insufficient documentation

## 2021-02-28 DIAGNOSIS — N186 End stage renal disease: Secondary | ICD-10-CM | POA: Diagnosis not present

## 2021-02-28 DIAGNOSIS — Z885 Allergy status to narcotic agent status: Secondary | ICD-10-CM | POA: Insufficient documentation

## 2021-02-28 DIAGNOSIS — D469 Myelodysplastic syndrome, unspecified: Secondary | ICD-10-CM | POA: Insufficient documentation

## 2021-02-28 DIAGNOSIS — Z880 Allergy status to penicillin: Secondary | ICD-10-CM | POA: Insufficient documentation

## 2021-02-28 DIAGNOSIS — M869 Osteomyelitis, unspecified: Secondary | ICD-10-CM

## 2021-02-28 DIAGNOSIS — I2510 Atherosclerotic heart disease of native coronary artery without angina pectoris: Secondary | ICD-10-CM | POA: Insufficient documentation

## 2021-02-28 DIAGNOSIS — E1151 Type 2 diabetes mellitus with diabetic peripheral angiopathy without gangrene: Secondary | ICD-10-CM | POA: Insufficient documentation

## 2021-02-28 DIAGNOSIS — I480 Paroxysmal atrial fibrillation: Secondary | ICD-10-CM | POA: Diagnosis not present

## 2021-02-28 DIAGNOSIS — I10 Essential (primary) hypertension: Secondary | ICD-10-CM

## 2021-02-28 DIAGNOSIS — Z794 Long term (current) use of insulin: Secondary | ICD-10-CM | POA: Diagnosis not present

## 2021-02-28 DIAGNOSIS — Z992 Dependence on renal dialysis: Secondary | ICD-10-CM | POA: Diagnosis not present

## 2021-02-28 DIAGNOSIS — D631 Anemia in chronic kidney disease: Secondary | ICD-10-CM | POA: Diagnosis not present

## 2021-02-28 DIAGNOSIS — Z8616 Personal history of COVID-19: Secondary | ICD-10-CM | POA: Insufficient documentation

## 2021-02-28 LAB — COMPREHENSIVE METABOLIC PANEL
ALT: 34 U/L (ref 0–44)
AST: 61 U/L — ABNORMAL HIGH (ref 15–41)
Albumin: 3.2 g/dL — ABNORMAL LOW (ref 3.5–5.0)
Alkaline Phosphatase: 129 U/L — ABNORMAL HIGH (ref 38–126)
Anion gap: 10 (ref 5–15)
BUN: 36 mg/dL — ABNORMAL HIGH (ref 8–23)
CO2: 29 mmol/L (ref 22–32)
Calcium: 9 mg/dL (ref 8.9–10.3)
Chloride: 91 mmol/L — ABNORMAL LOW (ref 98–111)
Creatinine, Ser: 3.68 mg/dL — ABNORMAL HIGH (ref 0.44–1.00)
GFR, Estimated: 13 mL/min — ABNORMAL LOW (ref >60)
Glucose, Bld: 175 mg/dL — ABNORMAL HIGH (ref 70–99)
Potassium: 4 mmol/L (ref 3.5–5.1)
Sodium: 130 mmol/L — ABNORMAL LOW (ref 135–145)
Total Bilirubin: 1.7 mg/dL — ABNORMAL HIGH (ref 0.3–1.2)
Total Protein: 6.5 g/dL (ref 6.5–8.1)

## 2021-02-28 NOTE — Patient Instructions (Signed)
It was great to see you today! No medication changes are needed at this time.  Labs today We will only contact you if something comes back abnormal or we need to make some changes. Otherwise no news is good news!  Your physician has recommended that you have a sleep study. This test records several body functions during sleep, including: brain activity, eye movement, oxygen and carbon dioxide blood levels, heart rate and rhythm, breathing rate and rhythm, the flow of air through your mouth and nose, snoring, body muscle movements, and chest and belly movement.  Your physician recommends that you schedule a follow-up appointment in: 3 months with Dr Haroldine Laws  Do the following things EVERYDAY: Weigh yourself in the morning before breakfast. Write it down and keep it in a log. Take your medicines as prescribed Eat low salt foods--Limit salt (sodium) to 2000 mg per day.  Stay as active as you can everyday Limit all fluids for the day to less than 2 liters  At the [facility], you and your health needs are our priority. As part of our continuing mission to provide you with exceptional heart care, we have created designated Provider Care Teams. These Care Teams include your primary Cardiologist (physician) and Advanced Practice Providers (APPs- Physician Assistants and Nurse Practitioners) who all work together to provide you with the care you need, when you need it.   You may see any of the following providers on your designated Care Team at your next follow up: Dr Glori Bickers Dr Loralie Champagne Dr Patrice Paradise, NP Lyda Jester, [place] Ginnie Smart Audry Riles, PharmD   Please be sure to bring in all your medications bottles to every appointment.

## 2021-03-02 ENCOUNTER — Telehealth (HOSPITAL_COMMUNITY): Payer: Self-pay | Admitting: Surgery

## 2021-03-02 NOTE — Telephone Encounter (Signed)
I called patient to inform her that insurance prior authorization is not required and it is okay to proceed with the home sleep study.  She tells me that she plans to complete tonite.

## 2021-03-05 ENCOUNTER — Other Ambulatory Visit: Payer: Self-pay

## 2021-03-05 ENCOUNTER — Inpatient Hospital Stay: Payer: Medicare Other

## 2021-03-05 ENCOUNTER — Other Ambulatory Visit: Payer: Self-pay | Admitting: *Deleted

## 2021-03-05 DIAGNOSIS — D469 Myelodysplastic syndrome, unspecified: Secondary | ICD-10-CM

## 2021-03-05 DIAGNOSIS — N189 Chronic kidney disease, unspecified: Secondary | ICD-10-CM | POA: Diagnosis not present

## 2021-03-05 LAB — CBC WITH DIFFERENTIAL (CANCER CENTER ONLY)
Abs Immature Granulocytes: 0.01 10*3/uL (ref 0.00–0.07)
Basophils Absolute: 0 10*3/uL (ref 0.0–0.1)
Basophils Relative: 1 %
Eosinophils Absolute: 0 10*3/uL (ref 0.0–0.5)
Eosinophils Relative: 1 %
HCT: 22.9 % — ABNORMAL LOW (ref 36.0–46.0)
Hemoglobin: 7.9 g/dL — ABNORMAL LOW (ref 12.0–15.0)
Immature Granulocytes: 0 %
Lymphocytes Relative: 15 %
Lymphs Abs: 0.6 10*3/uL — ABNORMAL LOW (ref 0.7–4.0)
MCH: 30.9 pg (ref 26.0–34.0)
MCHC: 34.5 g/dL (ref 30.0–36.0)
MCV: 89.5 fL (ref 80.0–100.0)
Monocytes Absolute: 0.6 10*3/uL (ref 0.1–1.0)
Monocytes Relative: 16 %
Neutro Abs: 2.5 10*3/uL (ref 1.7–7.7)
Neutrophils Relative %: 67 %
Platelet Count: 166 10*3/uL (ref 150–400)
RBC: 2.56 MIL/uL — ABNORMAL LOW (ref 3.87–5.11)
RDW: 16.4 % — ABNORMAL HIGH (ref 11.5–15.5)
WBC Count: 3.7 10*3/uL — ABNORMAL LOW (ref 4.0–10.5)
nRBC: 0 % (ref 0.0–0.2)

## 2021-03-05 LAB — SAMPLE TO BLOOD BANK

## 2021-03-05 LAB — PREPARE RBC (CROSSMATCH)

## 2021-03-05 MED ORDER — DIPHENHYDRAMINE HCL 25 MG PO CAPS
25.0000 mg | ORAL_CAPSULE | Freq: Once | ORAL | Status: AC
  Administered 2021-03-05: 25 mg via ORAL
  Filled 2021-03-05: qty 1

## 2021-03-05 MED ORDER — HEPARIN SODIUM (PORCINE) 1000 UNIT/ML DIALYSIS: 2000.0000 [IU] | Freq: Once | INTRAMUSCULAR | Status: DC

## 2021-03-05 MED ORDER — ACETAMINOPHEN 325 MG PO TABS
650.0000 mg | ORAL_TABLET | Freq: Once | ORAL | Status: AC
  Administered 2021-03-05: 650 mg via ORAL
  Filled 2021-03-05: qty 2

## 2021-03-05 MED ORDER — DIPHENHYDRAMINE HCL 25 MG PO CAPS
25.0000 mg | ORAL_CAPSULE | Freq: Once | ORAL | Status: AC
  Administered 2021-03-05: 25 mg via ORAL

## 2021-03-05 MED ORDER — SODIUM CHLORIDE 0.9% IV SOLUTION
250.0000 mL | Freq: Once | INTRAVENOUS | Status: AC
Start: 2021-03-05 — End: 2021-03-05
  Administered 2021-03-05: 250 mL via INTRAVENOUS

## 2021-03-05 NOTE — Patient Instructions (Signed)
https://www.[facility].org/donate-blood/blood-donation-process/what-happens-to-donated-blood/blood-transfusions/types-of-[email or web address].html"> https://www.[facility].org/donate-blood/blood-donation-process/what-happens-to-donated-blood/blood-transfusions/risks-complications.html">  Blood Transfusion, Adult, Care After This sheet gives you information about how to care for yourself after your procedure. Your health care provider may also give you more specific instructions. If you have problems or questions, contact your health careprovider. What can I expect after the procedure? After the procedure, it is common to have: Bruising and soreness where the IV was inserted. A fever or chills on the day of the procedure. This may be your body's response to the new blood cells received. A headache. Follow these instructions at home: IV insertion site care     Follow instructions from your health care provider about how to take care of your IV insertion site. Make sure you: Wash your hands with soap and water before and after you change your bandage (dressing). If soap and water are not available, use hand sanitizer. Change your dressing as told by your health care provider. Check your IV insertion site every day for signs of infection. Check for: Redness, swelling, or pain. Bleeding from the site. Warmth. Pus or a bad smell. General instructions Take over-the-counter and prescription medicines only as told by your health care provider. Rest as told by your health care provider. Return to your normal activities as told by your health care provider. Keep all follow-up visits as told by your health care provider. This is important. Contact a health care provider if: You have itching or red, swollen areas of skin (hives). You feel anxious. You feel weak after doing your normal activities. You have redness, swelling, warmth, or pain around the IV insertion site. You have blood coming  from the IV insertion site that does not stop with pressure. You have pus or a bad smell coming from your IV insertion site. Get help right away if: You have symptoms of a serious allergic or immune system reaction, including: Trouble breathing or shortness of breath. Swelling of the face or feeling flushed. Fever or chills. Pain in the head, back, or chest. Dark urine or blood in the urine. Widespread rash. Fast heartbeat. Feeling dizzy or light-headed. If you receive your blood transfusion in an outpatient setting, you will betold whom to contact to report any reactions. These symptoms may represent a serious problem that is an emergency. Do not wait to see if the symptoms will go away. Get medical help right away. Call your local emergency services (911 in the [place] Do not drive yourself to the hospital. Summary Bruising and tenderness around the IV insertion site are common. Check your IV insertion site every day for signs of infection. Rest as told by your health care provider. Return to your normal activities as told by your health care provider. Get help right away for symptoms of a serious allergic or immune system reaction to blood transfusion. This information is not intended to replace advice given to you by your health care provider. Make sure you discuss any questions you have with your healthcare provider. Document Revised: 12/24/2018 Document Reviewed: 12/24/2018 Elsevier Patient Education  2022 [employer]  

## 2021-03-05 NOTE — Progress Notes (Signed)
Patient is requesting 50 mg of benadryl before her blood transfusion, she states she has had some itching with blood transfusions recently & the 50 mg helps, order entered.

## 2021-03-06 ENCOUNTER — Other Ambulatory Visit: Payer: Medicare Other

## 2021-03-06 LAB — BPAM RBC
Blood Product Expiration Date: 202209142359
ISSUE DATE / TIME: 202208221325
Unit Type and Rh: 600

## 2021-03-06 LAB — TYPE AND SCREEN
ABO/RH(D): A NEG
Antibody Screen: NEGATIVE
Unit division: 0

## 2021-03-12 ENCOUNTER — Inpatient Hospital Stay: Payer: Medicare Other

## 2021-03-12 ENCOUNTER — Other Ambulatory Visit: Payer: Medicare Other

## 2021-03-12 ENCOUNTER — Other Ambulatory Visit: Payer: Self-pay | Admitting: *Deleted

## 2021-03-12 ENCOUNTER — Other Ambulatory Visit: Payer: Self-pay

## 2021-03-12 DIAGNOSIS — D469 Myelodysplastic syndrome, unspecified: Secondary | ICD-10-CM

## 2021-03-12 DIAGNOSIS — D500 Iron deficiency anemia secondary to blood loss (chronic): Secondary | ICD-10-CM

## 2021-03-12 DIAGNOSIS — N189 Chronic kidney disease, unspecified: Secondary | ICD-10-CM | POA: Diagnosis not present

## 2021-03-12 DIAGNOSIS — D649 Anemia, unspecified: Secondary | ICD-10-CM

## 2021-03-12 LAB — PREPARE RBC (CROSSMATCH)

## 2021-03-12 LAB — CBC WITH DIFFERENTIAL (CANCER CENTER ONLY)
Abs Immature Granulocytes: 0.01 10*3/uL (ref 0.00–0.07)
Basophils Absolute: 0 10*3/uL (ref 0.0–0.1)
Basophils Relative: 0 %
Eosinophils Absolute: 0 10*3/uL (ref 0.0–0.5)
Eosinophils Relative: 1 %
HCT: 23.1 % — ABNORMAL LOW (ref 36.0–46.0)
Hemoglobin: 7.7 g/dL — ABNORMAL LOW (ref 12.0–15.0)
Immature Granulocytes: 0 %
Lymphocytes Relative: 13 %
Lymphs Abs: 0.5 10*3/uL — ABNORMAL LOW (ref 0.7–4.0)
MCH: 30.8 pg (ref 26.0–34.0)
MCHC: 33.3 g/dL (ref 30.0–36.0)
MCV: 92.4 fL (ref 80.0–100.0)
Monocytes Absolute: 0.6 10*3/uL (ref 0.1–1.0)
Monocytes Relative: 16 %
Neutro Abs: 2.7 10*3/uL (ref 1.7–7.7)
Neutrophils Relative %: 70 %
Platelet Count: 175 10*3/uL (ref 150–400)
RBC: 2.5 MIL/uL — ABNORMAL LOW (ref 3.87–5.11)
RDW: 16.3 % — ABNORMAL HIGH (ref 11.5–15.5)
WBC Count: 3.9 10*3/uL — ABNORMAL LOW (ref 4.0–10.5)
nRBC: 0 % (ref 0.0–0.2)

## 2021-03-12 LAB — SAMPLE TO BLOOD BANK

## 2021-03-12 MED ORDER — DIPHENHYDRAMINE HCL 25 MG PO CAPS: 50.0000 mg | ORAL_CAPSULE | Freq: Once | ORAL | Status: DC

## 2021-03-13 ENCOUNTER — Other Ambulatory Visit: Payer: Medicare Other

## 2021-03-13 ENCOUNTER — Inpatient Hospital Stay: Payer: Medicare Other

## 2021-03-13 ENCOUNTER — Other Ambulatory Visit: Payer: Self-pay

## 2021-03-13 DIAGNOSIS — N189 Chronic kidney disease, unspecified: Secondary | ICD-10-CM | POA: Diagnosis not present

## 2021-03-13 DIAGNOSIS — D500 Iron deficiency anemia secondary to blood loss (chronic): Secondary | ICD-10-CM

## 2021-03-13 MED ORDER — ACETAMINOPHEN 325 MG PO TABS
650.0000 mg | ORAL_TABLET | Freq: Once | ORAL | Status: AC
  Administered 2021-03-13: 650 mg via ORAL
  Filled 2021-03-13: qty 2

## 2021-03-13 MED ORDER — SODIUM CHLORIDE 0.9% IV SOLUTION
250.0000 mL | Freq: Once | INTRAVENOUS | Status: AC
  Administered 2021-03-13: 250 mL via INTRAVENOUS

## 2021-03-13 MED ORDER — DIPHENHYDRAMINE HCL 25 MG PO CAPS
50.0000 mg | ORAL_CAPSULE | Freq: Once | ORAL | Status: AC
  Administered 2021-03-13: 50 mg via ORAL
  Filled 2021-03-13: qty 2

## 2021-03-13 NOTE — Patient Instructions (Signed)
Blood Transfusion, Adult, Care After This sheet gives you information about how to care for yourself after your procedure. Your doctor may also give you more specific instructions. If youhave problems or questions, contact your doctor. What can I expect after the procedure? After the procedure, it is common to have: Bruising and soreness at the IV site. A fever or chills on the day of the procedure. This may be your body's response to the new blood cells received. A headache. Follow these instructions at home: Insertion site care     Follow instructions from your doctor about how to take care of your insertion site. This is where an IV tube was put into your vein. Make sure you: Wash your hands with soap and water before and after you change your bandage (dressing). If you cannot use soap and water, use hand sanitizer. Change your bandage as told by your doctor. Check your insertion site every day for signs of infection. Check for: Redness, swelling, or pain. Bleeding from the site. Warmth. Pus or a bad smell. General instructions Take over-the-counter and prescription medicines only as told by your doctor. Rest as told by your doctor. Go back to your normal activities as told by your doctor. Keep all follow-up visits as told by your doctor. This is important. Contact a doctor if: You have itching or red, swollen areas of skin (hives). You feel worried or nervous (anxious). You feel weak after doing your normal activities. You have redness, swelling, warmth, or pain around the insertion site. You have blood coming from the insertion site, and the blood does not stop with pressure. You have pus or a bad smell coming from the insertion site. Get help right away if: You have signs of a serious reaction. This may be coming from an allergy or the body's defense system (immune system). Signs include: Trouble breathing or shortness of breath. Swelling of the face or feeling warm  (flushed). Fever or chills. Head, chest, or back pain. Dark pee (urine) or blood in the pee. Widespread rash. Fast heartbeat. Feeling dizzy or light-headed. You may receive your blood transfusion in an outpatient setting. If so, youwill be told whom to contact to report any reactions. These symptoms may be an emergency. Do not wait to see if the symptoms will go away. Get medical help right away. Call your local emergency services (911 in the [place] Do not drive yourself to the hospital. Summary Bruising and soreness at the IV site are common. Check your insertion site every day for signs of infection. Rest as told by your doctor. Go back to your normal activities as told by your doctor. Get help right away if you have signs of a serious reaction. This information is not intended to replace advice given to you by your health care provider. Make sure you discuss any questions you have with your healthcare provider. Document Revised: 12/24/2018 Document Reviewed: 12/24/2018 Elsevier Patient Education  2022 [employer] Inc[employer]  

## 2021-03-14 LAB — TYPE AND SCREEN
ABO/RH(D): A NEG
Antibody Screen: NEGATIVE
Unit division: 0
Unit division: 0

## 2021-03-14 LAB — BPAM RBC
Blood Product Expiration Date: 202209182359
Blood Product Expiration Date: 202209262359
ISSUE DATE / TIME: 202208301343
ISSUE DATE / TIME: 202208301343
Unit Type and Rh: 600
Unit Type and Rh: 600

## 2021-03-19 ENCOUNTER — Other Ambulatory Visit: Payer: Self-pay | Admitting: Nurse Practitioner

## 2021-03-19 DIAGNOSIS — E039 Hypothyroidism, unspecified: Secondary | ICD-10-CM

## 2021-03-20 ENCOUNTER — Other Ambulatory Visit: Payer: Medicare Other

## 2021-03-21 ENCOUNTER — Other Ambulatory Visit: Payer: Self-pay

## 2021-03-21 ENCOUNTER — Inpatient Hospital Stay: Payer: Medicare Other | Attending: Oncology

## 2021-03-21 ENCOUNTER — Inpatient Hospital Stay: Payer: Medicare Other

## 2021-03-21 DIAGNOSIS — N189 Chronic kidney disease, unspecified: Secondary | ICD-10-CM | POA: Diagnosis present

## 2021-03-21 DIAGNOSIS — D469 Myelodysplastic syndrome, unspecified: Secondary | ICD-10-CM | POA: Diagnosis not present

## 2021-03-21 DIAGNOSIS — D631 Anemia in chronic kidney disease: Secondary | ICD-10-CM | POA: Insufficient documentation

## 2021-03-21 DIAGNOSIS — D649 Anemia, unspecified: Secondary | ICD-10-CM

## 2021-03-21 LAB — CBC WITH DIFFERENTIAL (CANCER CENTER ONLY)
Abs Immature Granulocytes: 0.01 10*3/uL (ref 0.00–0.07)
Basophils Absolute: 0 10*3/uL (ref 0.0–0.1)
Basophils Relative: 0 %
Eosinophils Absolute: 0 10*3/uL (ref 0.0–0.5)
Eosinophils Relative: 1 %
HCT: 27 % — ABNORMAL LOW (ref 36.0–46.0)
Hemoglobin: 9.3 g/dL — ABNORMAL LOW (ref 12.0–15.0)
Immature Granulocytes: 0 %
Lymphocytes Relative: 13 %
Lymphs Abs: 0.6 10*3/uL — ABNORMAL LOW (ref 0.7–4.0)
MCH: 30.1 pg (ref 26.0–34.0)
MCHC: 34.4 g/dL (ref 30.0–36.0)
MCV: 87.4 fL (ref 80.0–100.0)
Monocytes Absolute: 0.9 10*3/uL (ref 0.1–1.0)
Monocytes Relative: 21 %
Neutro Abs: 2.8 10*3/uL (ref 1.7–7.7)
Neutrophils Relative %: 65 %
Platelet Count: 147 10*3/uL — ABNORMAL LOW (ref 150–400)
RBC: 3.09 MIL/uL — ABNORMAL LOW (ref 3.87–5.11)
RDW: 15.9 % — ABNORMAL HIGH (ref 11.5–15.5)
WBC Count: 4.3 10*3/uL (ref 4.0–10.5)
nRBC: 0 % (ref 0.0–0.2)

## 2021-03-21 LAB — SAMPLE TO BLOOD BANK

## 2021-03-21 NOTE — Progress Notes (Signed)
Pt's Hbg 9.3 today which is outside tx parameter for PRBCs.  Discharged pt home.

## 2021-03-26 ENCOUNTER — Inpatient Hospital Stay: Payer: Medicare Other

## 2021-03-26 ENCOUNTER — Other Ambulatory Visit (HOSPITAL_COMMUNITY): Payer: Self-pay | Admitting: Cardiovascular Disease

## 2021-03-26 ENCOUNTER — Telehealth: Payer: Self-pay | Admitting: *Deleted

## 2021-03-26 ENCOUNTER — Inpatient Hospital Stay (HOSPITAL_BASED_OUTPATIENT_CLINIC_OR_DEPARTMENT_OTHER): Payer: Medicare Other | Admitting: Oncology

## 2021-03-26 ENCOUNTER — Other Ambulatory Visit: Payer: Self-pay

## 2021-03-26 ENCOUNTER — Other Ambulatory Visit: Payer: Self-pay | Admitting: *Deleted

## 2021-03-26 VITALS — BP 141/60 | HR 70 | Temp 98.7°F | Resp 18

## 2021-03-26 VITALS — BP 121/34 | HR 76 | Temp 98.8°F | Resp 18 | Ht 64.0 in | Wt 199.0 lb

## 2021-03-26 DIAGNOSIS — D500 Iron deficiency anemia secondary to blood loss (chronic): Secondary | ICD-10-CM

## 2021-03-26 DIAGNOSIS — D469 Myelodysplastic syndrome, unspecified: Secondary | ICD-10-CM

## 2021-03-26 DIAGNOSIS — Z8679 Personal history of other diseases of the circulatory system: Secondary | ICD-10-CM

## 2021-03-26 DIAGNOSIS — D649 Anemia, unspecified: Secondary | ICD-10-CM

## 2021-03-26 LAB — CBC WITH DIFFERENTIAL (CANCER CENTER ONLY)
Abs Immature Granulocytes: 0.01 10*3/uL (ref 0.00–0.07)
Basophils Absolute: 0 10*3/uL (ref 0.0–0.1)
Basophils Relative: 0 %
Eosinophils Absolute: 0 10*3/uL (ref 0.0–0.5)
Eosinophils Relative: 1 %
HCT: 22 % — ABNORMAL LOW (ref 36.0–46.0)
Hemoglobin: 7.5 g/dL — ABNORMAL LOW (ref 12.0–15.0)
Immature Granulocytes: 0 %
Lymphocytes Relative: 15 %
Lymphs Abs: 0.4 10*3/uL — ABNORMAL LOW (ref 0.7–4.0)
MCH: 30.5 pg (ref 26.0–34.0)
MCHC: 34.1 g/dL (ref 30.0–36.0)
MCV: 89.4 fL (ref 80.0–100.0)
Monocytes Absolute: 0.6 10*3/uL (ref 0.1–1.0)
Monocytes Relative: 23 %
Neutro Abs: 1.7 10*3/uL (ref 1.7–7.7)
Neutrophils Relative %: 61 %
Platelet Count: 159 10*3/uL (ref 150–400)
RBC: 2.46 MIL/uL — ABNORMAL LOW (ref 3.87–5.11)
RDW: 15.7 % — ABNORMAL HIGH (ref 11.5–15.5)
WBC Count: 2.8 10*3/uL — ABNORMAL LOW (ref 4.0–10.5)
nRBC: 0 % (ref 0.0–0.2)

## 2021-03-26 LAB — PREPARE RBC (CROSSMATCH)

## 2021-03-26 LAB — CMP (CANCER CENTER ONLY)
ALT: 35 U/L (ref 0–44)
AST: 59 U/L — ABNORMAL HIGH (ref 15–41)
Albumin: 3.4 g/dL — ABNORMAL LOW (ref 3.5–5.0)
Alkaline Phosphatase: 151 U/L — ABNORMAL HIGH (ref 38–126)
Anion gap: 15 (ref 5–15)
BUN: 44 mg/dL — ABNORMAL HIGH (ref 8–23)
CO2: 25 mmol/L (ref 22–32)
Calcium: 9.6 mg/dL (ref 8.9–10.3)
Chloride: 97 mmol/L — ABNORMAL LOW (ref 98–111)
Creatinine: 5.14 mg/dL (ref 0.44–1.00)
GFR, Estimated: 9 mL/min — ABNORMAL LOW (ref >60)
Glucose, Bld: 140 mg/dL — ABNORMAL HIGH (ref 70–99)
Potassium: 3.4 mmol/L — ABNORMAL LOW (ref 3.5–5.1)
Sodium: 137 mmol/L (ref 135–145)
Total Bilirubin: 0.8 mg/dL (ref 0.3–1.2)
Total Protein: 6.9 g/dL (ref 6.5–8.1)

## 2021-03-26 LAB — SAMPLE TO BLOOD BANK

## 2021-03-26 MED ORDER — ACETAMINOPHEN 325 MG PO TABS
650.0000 mg | ORAL_TABLET | Freq: Once | ORAL | Status: AC
  Administered 2021-03-26: 650 mg via ORAL
  Filled 2021-03-26: qty 2

## 2021-03-26 MED ORDER — LUSPATERCEPT-AAMT 75 MG ~~LOC~~ SOLR
1.7500 mg/kg | Freq: Once | SUBCUTANEOUS | Status: AC
Start: 2021-03-26 — End: 2021-03-26
  Administered 2021-03-26: 150 mg via SUBCUTANEOUS
  Filled 2021-03-26: qty 3

## 2021-03-26 MED ORDER — SODIUM CHLORIDE 0.9% IV SOLUTION
250.0000 mL | Freq: Once | INTRAVENOUS | Status: AC
  Administered 2021-03-26: 250 mL via INTRAVENOUS

## 2021-03-26 MED ORDER — DIPHENHYDRAMINE HCL 25 MG PO CAPS
50.0000 mg | ORAL_CAPSULE | Freq: Once | ORAL | Status: AC
  Administered 2021-03-26: 50 mg via ORAL
  Filled 2021-03-26: qty 2

## 2021-03-26 NOTE — Telephone Encounter (Signed)
CRITICAL VALUE STICKER  CRITICAL VALUE: Creatinine 5.14  RECEIVER (on-site recipient of call): Georgina Pillion, [place] NOTIFIED: 03/26/21; 1107  MESSENGER (representative from lab):Pam  MD NOTIFIED: Dr. Alen Blew  TIME OF NOTIFICATION: 1115   RESPONSE: Information acknowledged

## 2021-03-26 NOTE — Progress Notes (Signed)
Per Dr. Alen Blew, ok to treat with elevated SCr 5.14.

## 2021-03-26 NOTE — Addendum Note (Signed)
Addended by: [name] on: 03/26/2021 10:21 AM   Modules accepted: Orders

## 2021-03-26 NOTE — Progress Notes (Signed)
Dr. Alen Blew gave verbal orders for 1 unit PRBCs with Tylenol 650 mg and Benadryl 25 mg PO ordered for pre meds Order for type and screen and prepare blood released and confirmed with [facility]

## 2021-03-26 NOTE — Progress Notes (Signed)
Benadryl dose increased to 50 mg today - Pt requests 50 mg of benadryl with each blood transfusion now, says she has some itching with only 25 mg per RN.   Kennith Center, Pharm.D., CPP 03/26/2021@9 :52 AM

## 2021-03-27 LAB — TYPE AND SCREEN
ABO/RH(D): A NEG
Antibody Screen: NEGATIVE
Unit division: 0

## 2021-03-27 LAB — BPAM RBC
Blood Product Expiration Date: 202210012359
ISSUE DATE / TIME: 202209121128
Unit Type and Rh: 600

## 2021-03-28 ENCOUNTER — Telehealth: Payer: Self-pay | Admitting: *Deleted

## 2021-03-28 NOTE — Telephone Encounter (Signed)
PC to patient, informed her she does not need blood transfusion tomorrow per Dr. Alen Blew, appointment has ben canceled.  Patient verbalizes understanding.

## 2021-03-29 ENCOUNTER — Inpatient Hospital Stay: Payer: Medicare Other

## 2021-04-02 ENCOUNTER — Other Ambulatory Visit: Payer: Self-pay

## 2021-04-02 ENCOUNTER — Ambulatory Visit (HOSPITAL_COMMUNITY)
Admission: RE | Admit: 2021-04-02 | Discharge: 2021-04-02 | Disposition: A | Discharge status: Home / Self Care | Payer: Medicare Other | Source: Ambulatory Visit | Attending: Internal Medicine | Admitting: Internal Medicine

## 2021-04-02 DIAGNOSIS — Z8679 Personal history of other diseases of the circulatory system: Secondary | ICD-10-CM | POA: Diagnosis present

## 2021-04-02 DIAGNOSIS — I701 Atherosclerosis of renal artery: Secondary | ICD-10-CM

## 2021-04-04 ENCOUNTER — Other Ambulatory Visit: Payer: Self-pay

## 2021-04-04 ENCOUNTER — Inpatient Hospital Stay: Payer: Medicare Other

## 2021-04-04 ENCOUNTER — Other Ambulatory Visit: Payer: Self-pay | Admitting: *Deleted

## 2021-04-04 ENCOUNTER — Telehealth: Payer: Self-pay | Admitting: *Deleted

## 2021-04-04 DIAGNOSIS — D469 Myelodysplastic syndrome, unspecified: Secondary | ICD-10-CM

## 2021-04-04 DIAGNOSIS — D500 Iron deficiency anemia secondary to blood loss (chronic): Secondary | ICD-10-CM

## 2021-04-04 DIAGNOSIS — D631 Anemia in chronic kidney disease: Secondary | ICD-10-CM

## 2021-04-04 LAB — CBC WITH DIFFERENTIAL (CANCER CENTER ONLY)
Abs Immature Granulocytes: 0.03 10*3/uL (ref 0.00–0.07)
Basophils Absolute: 0 10*3/uL (ref 0.0–0.1)
Basophils Relative: 0 %
Eosinophils Absolute: 0.1 10*3/uL (ref 0.0–0.5)
Eosinophils Relative: 1 %
HCT: 25.3 % — ABNORMAL LOW (ref 36.0–46.0)
Hemoglobin: 8.5 g/dL — ABNORMAL LOW (ref 12.0–15.0)
Immature Granulocytes: 1 %
Lymphocytes Relative: 13 %
Lymphs Abs: 0.7 10*3/uL (ref 0.7–4.0)
MCH: 30 pg (ref 26.0–34.0)
MCHC: 33.6 g/dL (ref 30.0–36.0)
MCV: 89.4 fL (ref 80.0–100.0)
Monocytes Absolute: 1.2 10*3/uL — ABNORMAL HIGH (ref 0.1–1.0)
Monocytes Relative: 21 %
Neutro Abs: 3.7 10*3/uL (ref 1.7–7.7)
Neutrophils Relative %: 64 %
Platelet Count: 235 10*3/uL (ref 150–400)
RBC: 2.83 MIL/uL — ABNORMAL LOW (ref 3.87–5.11)
RDW: 15.5 % (ref 11.5–15.5)
WBC Count: 5.6 10*3/uL (ref 4.0–10.5)
nRBC: 2 % — ABNORMAL HIGH (ref 0.0–0.2)

## 2021-04-04 LAB — CMP (CANCER CENTER ONLY)
ALT: 22 U/L (ref 0–44)
AST: 39 U/L (ref 15–41)
Albumin: 3.6 g/dL (ref 3.5–5.0)
Alkaline Phosphatase: 152 U/L — ABNORMAL HIGH (ref 38–126)
Anion gap: 14 (ref 5–15)
BUN: 42 mg/dL — ABNORMAL HIGH (ref 8–23)
CO2: 30 mmol/L (ref 22–32)
Calcium: 10 mg/dL (ref 8.9–10.3)
Chloride: 97 mmol/L — ABNORMAL LOW (ref 98–111)
Creatinine: 4.91 mg/dL (ref 0.44–1.00)
GFR, Estimated: 9 mL/min — ABNORMAL LOW (ref >60)
Glucose, Bld: 211 mg/dL — ABNORMAL HIGH (ref 70–99)
Potassium: 3.6 mmol/L (ref 3.5–5.1)
Sodium: 141 mmol/L (ref 135–145)
Total Bilirubin: 1 mg/dL (ref 0.3–1.2)
Total Protein: 7.4 g/dL (ref 6.5–8.1)

## 2021-04-04 LAB — PREPARE RBC (CROSSMATCH)

## 2021-04-04 LAB — SAMPLE TO BLOOD BANK

## 2021-04-04 MED ORDER — SODIUM CHLORIDE 0.9% IV SOLUTION: 250.0000 mL | Freq: Once | INTRAVENOUS | Status: DC

## 2021-04-04 MED ORDER — SODIUM CHLORIDE 0.9% IV SOLUTION
250.0000 mL | Freq: Once | INTRAVENOUS | Status: AC
  Administered 2021-04-04: 250 mL via INTRAVENOUS

## 2021-04-04 MED ORDER — DIPHENHYDRAMINE HCL 25 MG PO CAPS
50.0000 mg | ORAL_CAPSULE | Freq: Once | ORAL | Status: AC
  Administered 2021-04-04: 50 mg via ORAL
  Filled 2021-04-04: qty 2

## 2021-04-04 MED ORDER — ACETAMINOPHEN 325 MG PO TABS
650.0000 mg | ORAL_TABLET | Freq: Once | ORAL | Status: AC
  Administered 2021-04-04: 650 mg via ORAL
  Filled 2021-04-04: qty 2

## 2021-04-04 NOTE — Telephone Encounter (Signed)
CRITICAL VALUE STICKER  CRITICAL VALUE: Creatinine 4.91  RECEIVER (on-site recipient of call): Georgina Pillion, [place] NOTIFIED: 04/04/2021; 1539  MESSENGER (representative from lab):Jen  MD NOTIFIED: Dr. Alen Blew  TIME OF NOTIFICATION: 1548  RESPONSE: Information acknowledged  MD

## 2021-04-04 NOTE — Patient Instructions (Signed)
Blood Transfusion, Adult, Care After This sheet gives you information about how to care for yourself after your procedure. Your doctor may also give you more specific instructions. If you have problems or questions, contact your doctor. What can I expect after the procedure? After the procedure, it is common to have: Bruising and soreness at the IV site. A headache. Follow these instructions at home: Insertion site care   Follow instructions from your doctor about how to take care of your insertion site. This is where an IV tube was put into your vein. Make sure you: Wash your hands with soap and water before and after you change your bandage (dressing). If you cannot use soap and water, use hand sanitizer. Change your bandage as told by your doctor. Check your insertion site every day for signs of infection. Check for: Redness, swelling, or pain. Bleeding from the site. Warmth. Pus or a bad smell. General instructions Take over-the-counter and prescription medicines only as told by your doctor. Rest as told by your doctor. Go back to your normal activities as told by your doctor. Keep all follow-up visits as told by your doctor. This is important. Contact a doctor if: You have itching or red, swollen areas of skin (hives). You feel worried or nervous (anxious). You feel weak after doing your normal activities. You have redness, swelling, warmth, or pain around the insertion site. You have blood coming from the insertion site, and the blood does not stop with pressure. You have pus or a bad smell coming from the insertion site. Get help right away if: You have signs of a serious reaction. This may be coming from an allergy or the body's defense system (immune system). Signs include: Trouble breathing or shortness of breath. Swelling of the face or feeling warm (flushed). Fever or chills. Head, chest, or back pain. Dark pee (urine) or blood in the pee. Widespread rash. Fast  heartbeat. Feeling dizzy or light-headed. You may receive your blood transfusion in an outpatient setting. If so, you will be told whom to contact to report any reactions. These symptoms may be an emergency. Do not wait to see if the symptoms will go away. Get medical help right away. Call your local emergency services (911 in the [place] Do not drive yourself to the hospital. Summary Bruising and soreness at the IV site are common. Check your insertion site every day for signs of infection. Rest as told by your doctor. Go back to your normal activities as told by your doctor. Get help right away if you have signs of a serious reaction. This information is not intended to replace advice given to you by your health care provider. Make sure you discuss any questions you have with your health care provider. Document Revised: 10/26/2020 Document Reviewed: 12/24/2018 Elsevier Patient Education  2022 [employer] Inc.  

## 2021-04-05 ENCOUNTER — Other Ambulatory Visit: Payer: Self-pay

## 2021-04-05 DIAGNOSIS — E1122 Type 2 diabetes mellitus with diabetic chronic kidney disease: Secondary | ICD-10-CM

## 2021-04-05 LAB — TYPE AND SCREEN
ABO/RH(D): A NEG
Antibody Screen: NEGATIVE
Unit division: 0

## 2021-04-05 LAB — BPAM RBC
Blood Product Expiration Date: 202210192359
ISSUE DATE / TIME: 202209211449
Unit Type and Rh: 600

## 2021-04-05 MED ORDER — LINAGLIPTIN 5 MG PO TABS
5.0000 mg | ORAL_TABLET | Freq: Every day | ORAL | 1 refills | Status: DC
Start: 2021-04-05 — End: 2021-06-14

## 2021-04-07 ENCOUNTER — Other Ambulatory Visit: Payer: Self-pay | Admitting: Nurse Practitioner

## 2021-04-11 ENCOUNTER — Inpatient Hospital Stay: Payer: Medicare Other

## 2021-04-11 ENCOUNTER — Other Ambulatory Visit: Payer: Self-pay

## 2021-04-11 DIAGNOSIS — D469 Myelodysplastic syndrome, unspecified: Secondary | ICD-10-CM | POA: Diagnosis not present

## 2021-04-11 DIAGNOSIS — D649 Anemia, unspecified: Secondary | ICD-10-CM

## 2021-04-11 DIAGNOSIS — D500 Iron deficiency anemia secondary to blood loss (chronic): Secondary | ICD-10-CM

## 2021-04-11 LAB — CBC WITH DIFFERENTIAL (CANCER CENTER ONLY)
Abs Immature Granulocytes: 0.02 10*3/uL (ref 0.00–0.07)
Basophils Absolute: 0 10*3/uL (ref 0.0–0.1)
Basophils Relative: 0 %
Eosinophils Absolute: 0 10*3/uL (ref 0.0–0.5)
Eosinophils Relative: 1 %
HCT: 26.1 % — ABNORMAL LOW (ref 36.0–46.0)
Hemoglobin: 8.8 g/dL — ABNORMAL LOW (ref 12.0–15.0)
Immature Granulocytes: 1 %
Lymphocytes Relative: 11 %
Lymphs Abs: 0.4 10*3/uL — ABNORMAL LOW (ref 0.7–4.0)
MCH: 29.9 pg (ref 26.0–34.0)
MCHC: 33.7 g/dL (ref 30.0–36.0)
MCV: 88.8 fL (ref 80.0–100.0)
Monocytes Absolute: 0.8 10*3/uL (ref 0.1–1.0)
Monocytes Relative: 22 %
Neutro Abs: 2.4 10*3/uL (ref 1.7–7.7)
Neutrophils Relative %: 65 %
Platelet Count: 174 10*3/uL (ref 150–400)
RBC: 2.94 MIL/uL — ABNORMAL LOW (ref 3.87–5.11)
RDW: 17 % — ABNORMAL HIGH (ref 11.5–15.5)
WBC Count: 3.7 10*3/uL — ABNORMAL LOW (ref 4.0–10.5)
nRBC: 0.5 % — ABNORMAL HIGH (ref 0.0–0.2)

## 2021-04-11 LAB — SAMPLE TO BLOOD BANK

## 2021-04-11 LAB — PREPARE RBC (CROSSMATCH)

## 2021-04-11 MED ORDER — SODIUM CHLORIDE 0.9% IV SOLUTION: 250.0000 mL | Freq: Once | INTRAVENOUS | Status: DC

## 2021-04-11 MED ORDER — ACETAMINOPHEN 325 MG PO TABS
650.0000 mg | ORAL_TABLET | Freq: Once | ORAL | Status: AC
  Administered 2021-04-11: 650 mg via ORAL
  Filled 2021-04-11: qty 2

## 2021-04-11 MED ORDER — DIPHENHYDRAMINE HCL 25 MG PO CAPS
50.0000 mg | ORAL_CAPSULE | Freq: Once | ORAL | Status: AC
  Administered 2021-04-11: 50 mg via ORAL
  Filled 2021-04-11: qty 2

## 2021-04-11 NOTE — Patient Instructions (Signed)
Blood Transfusion, Adult, Care After This sheet gives you information about how to care for yourself after your procedure. Your doctor may also give you more specific instructions. If you have problems or questions, contact your doctor. What can I expect after the procedure? After the procedure, it is common to have: Bruising and soreness at the IV site. A headache. Follow these instructions at home: Insertion site care   Follow instructions from your doctor about how to take care of your insertion site. This is where an IV tube was put into your vein. Make sure you: Wash your hands with soap and water before and after you change your bandage (dressing). If you cannot use soap and water, use hand sanitizer. Change your bandage as told by your doctor. Check your insertion site every day for signs of infection. Check for: Redness, swelling, or pain. Bleeding from the site. Warmth. Pus or a bad smell. General instructions Take over-the-counter and prescription medicines only as told by your doctor. Rest as told by your doctor. Go back to your normal activities as told by your doctor. Keep all follow-up visits as told by your doctor. This is important. Contact a doctor if: You have itching or red, swollen areas of skin (hives). You feel worried or nervous (anxious). You feel weak after doing your normal activities. You have redness, swelling, warmth, or pain around the insertion site. You have blood coming from the insertion site, and the blood does not stop with pressure. You have pus or a bad smell coming from the insertion site. Get help right away if: You have signs of a serious reaction. This may be coming from an allergy or the body's defense system (immune system). Signs include: Trouble breathing or shortness of breath. Swelling of the face or feeling warm (flushed). Fever or chills. Head, chest, or back pain. Dark pee (urine) or blood in the pee. Widespread rash. Fast  heartbeat. Feeling dizzy or light-headed. You may receive your blood transfusion in an outpatient setting. If so, you will be told whom to contact to report any reactions. These symptoms may be an emergency. Do not wait to see if the symptoms will go away. Get medical help right away. Call your local emergency services (911 in the [place] Do not drive yourself to the hospital. Summary Bruising and soreness at the IV site are common. Check your insertion site every day for signs of infection. Rest as told by your doctor. Go back to your normal activities as told by your doctor. Get help right away if you have signs of a serious reaction. This information is not intended to replace advice given to you by your health care provider. Make sure you discuss any questions you have with your health care provider. Document Revised: 10/26/2020 Document Reviewed: 12/24/2018 Elsevier Patient Education  2022 [employer] Inc.  

## 2021-04-12 LAB — BPAM RBC
Blood Product Expiration Date: 202210202359
ISSUE DATE / TIME: 202209281428
Unit Type and Rh: 600

## 2021-04-12 LAB — TYPE AND SCREEN
ABO/RH(D): A NEG
Antibody Screen: NEGATIVE
Unit division: 0

## 2021-04-13 ENCOUNTER — Other Ambulatory Visit (HOSPITAL_COMMUNITY): Payer: Self-pay | Admitting: Adult Health

## 2021-04-13 ENCOUNTER — Other Ambulatory Visit: Payer: Self-pay | Admitting: Nurse Practitioner

## 2021-04-16 ENCOUNTER — Other Ambulatory Visit: Payer: Self-pay | Admitting: *Deleted

## 2021-04-16 ENCOUNTER — Other Ambulatory Visit: Payer: Self-pay

## 2021-04-16 ENCOUNTER — Inpatient Hospital Stay (HOSPITAL_BASED_OUTPATIENT_CLINIC_OR_DEPARTMENT_OTHER): Payer: Medicare Other | Admitting: Oncology

## 2021-04-16 ENCOUNTER — Inpatient Hospital Stay: Payer: Medicare Other | Attending: Oncology

## 2021-04-16 ENCOUNTER — Inpatient Hospital Stay: Payer: Medicare Other

## 2021-04-16 ENCOUNTER — Other Ambulatory Visit: Payer: Self-pay | Admitting: Nurse Practitioner

## 2021-04-16 VITALS — BP 127/35 | HR 79 | Temp 98.0°F | Resp 17 | Ht 64.0 in | Wt 196.5 lb

## 2021-04-16 DIAGNOSIS — D649 Anemia, unspecified: Secondary | ICD-10-CM

## 2021-04-16 DIAGNOSIS — D500 Iron deficiency anemia secondary to blood loss (chronic): Secondary | ICD-10-CM

## 2021-04-16 DIAGNOSIS — D631 Anemia in chronic kidney disease: Secondary | ICD-10-CM | POA: Diagnosis not present

## 2021-04-16 DIAGNOSIS — N184 Chronic kidney disease, stage 4 (severe): Secondary | ICD-10-CM | POA: Diagnosis not present

## 2021-04-16 DIAGNOSIS — D469 Myelodysplastic syndrome, unspecified: Secondary | ICD-10-CM | POA: Diagnosis not present

## 2021-04-16 DIAGNOSIS — E039 Hypothyroidism, unspecified: Secondary | ICD-10-CM

## 2021-04-16 DIAGNOSIS — D464 Refractory anemia, unspecified: Secondary | ICD-10-CM | POA: Insufficient documentation

## 2021-04-16 LAB — CBC WITH DIFFERENTIAL (CANCER CENTER ONLY)
Abs Immature Granulocytes: 0.01 10*3/uL (ref 0.00–0.07)
Basophils Absolute: 0 10*3/uL (ref 0.0–0.1)
Basophils Relative: 0 %
Eosinophils Absolute: 0 10*3/uL (ref 0.0–0.5)
Eosinophils Relative: 1 %
HCT: 25.3 % — ABNORMAL LOW (ref 36.0–46.0)
Hemoglobin: 8.4 g/dL — ABNORMAL LOW (ref 12.0–15.0)
Immature Granulocytes: 0 %
Lymphocytes Relative: 13 %
Lymphs Abs: 0.5 10*3/uL — ABNORMAL LOW (ref 0.7–4.0)
MCH: 29.8 pg (ref 26.0–34.0)
MCHC: 33.2 g/dL (ref 30.0–36.0)
MCV: 89.7 fL (ref 80.0–100.0)
Monocytes Absolute: 0.8 10*3/uL (ref 0.1–1.0)
Monocytes Relative: 19 %
Neutro Abs: 2.7 10*3/uL (ref 1.7–7.7)
Neutrophils Relative %: 67 %
Platelet Count: 155 10*3/uL (ref 150–400)
RBC: 2.82 MIL/uL — ABNORMAL LOW (ref 3.87–5.11)
RDW: 15.9 % — ABNORMAL HIGH (ref 11.5–15.5)
WBC Count: 4.1 10*3/uL (ref 4.0–10.5)
nRBC: 0 % (ref 0.0–0.2)

## 2021-04-16 LAB — SAMPLE TO BLOOD BANK

## 2021-04-16 LAB — PREPARE RBC (CROSSMATCH)

## 2021-04-16 MED ORDER — OMEPRAZOLE 40 MG PO CPDR
40.0000 mg | DELAYED_RELEASE_CAPSULE | Freq: Two times a day (BID) | ORAL | 2 refills | Status: DC
Start: 2021-04-16 — End: 2021-05-15

## 2021-04-16 MED ORDER — ACETAMINOPHEN 325 MG PO TABS
650.0000 mg | ORAL_TABLET | Freq: Once | ORAL | Status: AC
  Administered 2021-04-16: 650 mg via ORAL
  Filled 2021-04-16: qty 2

## 2021-04-16 MED ORDER — DIPHENHYDRAMINE HCL 25 MG PO CAPS
50.0000 mg | ORAL_CAPSULE | Freq: Once | ORAL | Status: AC
  Administered 2021-04-16: 50 mg via ORAL
  Filled 2021-04-16: qty 2

## 2021-04-16 MED ORDER — SODIUM CHLORIDE 0.9% IV SOLUTION
250.0000 mL | Freq: Once | INTRAVENOUS | Status: AC
  Administered 2021-04-16: 250 mL via INTRAVENOUS

## 2021-04-16 NOTE — Progress Notes (Signed)
Hematology and Oncology Follow Up Visit  Patricia Mata [id number] 1956/06/20 65 y.o. 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  She presented with IPSS-R score of 2.5 and refractory anemia.  Secondary diagnosis: Stage III colon cancer diagnosed in 2008.  No additional treatment needed and continues to be disease-free.    Prior Therapy: She is S/P hemicolectomy done in 11/2006. 1/13 lymph nodes are positive.  This was followed by 12 cycles of FOLFOX completed in 05/2007  Aranesp 300 mcg every 2 to 3 weeks therapy was ineffective to eliminate transfusion needs.  5-azacytidine at 75 mg/m square subcutaneously started in October 2018.  She is status post 7 cycles of therapy completed in April 2019.   Current therapy:   Reblozyl 75 mg subcutaneous injection every 3 weeks started in December 2021.    Red cell transfusion to keep hemoglobin above 8.   Interim History:  Patricia Mata is here for return evaluation.  Since her last visit, she reports no major changes in her health.  She denies any hematochezia, melena or hemoptysis.  She denies any hospitalizations or illnesses.  She denies any shortness of breath or difficulty breathing her performance status and quality of life remained reasonable.    Medication: Reviewed without changes. Current Outpatient Medications on File Prior to Visit  Medication Sig Dispense Refill   allopurinol (ZYLOPRIM) 100 MG tablet Take 100 mg by mouth daily.     amiodarone (PACERONE) 200 MG tablet Take 200 mg by mouth daily.     amLODipine (NORVASC) 10 MG tablet Take 1 tablet (10 mg total) by mouth daily. 90 tablet 3   atorvastatin (LIPITOR) 40 MG tablet TAKE 1 TABLET BY MOUTH EVERY DAY 90 tablet 0   blood glucose meter kit and supplies Dispense based on patient and insurance preference. Use up to four times daily as directed. (FOR ICD-10 E10.9, E11.9). 1 each 3   colchicine 0.6 MG tablet Take 0.6 mg by  mouth 2 (two) times daily as needed (for gout).     doxazosin (CARDURA) 1 MG tablet Take 1 tablet (1 mg total) by mouth at bedtime. 30 tablet 6   doxycycline (ADOXA) 100 MG tablet Take 1 tablet (100 mg total) by mouth 2 (two) times daily for 7 days. 14 tablet 0   furosemide (LASIX) 40 MG tablet TAKE 1 TABLET BY MOUTH EVERY DAY 90 tablet 1   gabapentin (NEURONTIN) 300 MG capsule      glucose blood test strip Use as instructed 100 each 12   hydrALAZINE (APRESOLINE) 100 MG tablet Take 1 tablet (100 mg total) by mouth 3 (three) times daily. 180 tablet 5   insulin glargine (LANTUS) 100 unit/mL SOPN Inject 0.15 mLs (15 Units total) into the skin daily. (Patient taking differently: Inject 15 Units into the skin daily as needed (blood sugar over 150). )     Insulin Pen Needle 29G X 5MM MISC Use as directed 200 each 0   latanoprost (XALATAN) 0.005 % ophthalmic solution Place 1 drop into both eyes at bedtime.      levothyroxine (SYNTHROID) 88 MCG tablet Take 1 tablet (88 mcg total) by mouth daily. 30 tablet 2   metoprolol succinate (TOPROL-XL) 25 MG 24 hr tablet Take 1 tablet (25 mg total) by mouth daily. STOP taking this unless heart rate >100bpm OR directed by your doctor 30 tablet 5   Multiple Vitamins-Minerals (CENTRUM SILVER 50+WOMEN) TABS Take 1 tablet by mouth daily.  sildenafil (REVATIO) 20 MG tablet Take 1 tablet (20 mg total) by mouth 3 (three) times daily. 270 tablet 3   sitaGLIPtin (JANUVIA) 50 MG tablet Take 1 tablet (50 mg total) by mouth daily. 90 tablet 0               Allergies:  Allergies  Allergen Reactions   Ancef [Cefazolin] Itching    Severe itching- after procedure, ancef was the antibiotic.-04/02/17 Tolerates penicillins   Lactose Intolerance (Gi) Diarrhea   Morphine And Related Itching   Sulfa Antibiotics Rash and Other (See Comments)    Blisters, also     Physical Exam:      Blood pressure (!) 127/35, pulse 79, temperature 98 F (36.7 C), temperature  source Oral, resp. rate 17, height '5\' 4"'  (1.626 m), weight 196 lb 8 oz (89.1 kg), SpO2 99 %.       ECOG: 2   General appearance: Comfortable appearing without any discomfort Head: Normocephalic without any trauma Oropharynx: Mucous membranes are moist and pink without any thrush or ulcers. Eyes: Pupils are equal and round reactive to light. Lymph nodes: No cervical, supraclavicular, inguinal or axillary lymphadenopathy.   Heart:regular rate and rhythm.  S1 and S2 without leg edema. Lung: Clear without any rhonchi or wheezes.  No dullness to percussion. Abdomin: Soft, nontender, nondistended with good bowel sounds.  No hepatosplenomegaly. Musculoskeletal: No joint deformity or effusion.  Full range of motion noted. Neurological: No deficits noted on motor, sensory and deep tendon reflex exam. Skin: No petechial rash or dryness.  Appeared moist.                    Lab Results: Lab Results  Component Value Date   WBC 3.7 (L) 04/11/2021   HGB 8.8 (L) 04/11/2021   HCT 26.1 (L) 04/11/2021   MCV 88.8 04/11/2021   PLT 174 04/11/2021     Chemistry      Component Value Date/Time   NA 141 04/04/2021 1243   NA 139 09/05/2020 1253   NA 134 (L) 05/26/2017 0949   K 3.6 04/04/2021 1243   K 4.0 05/26/2017 0949   CL 97 (L) 04/04/2021 1243   CL 96 (L) 03/31/2012 1513   CO2 30 04/04/2021 1243   CO2 24 05/26/2017 0949   BUN 42 (H) 04/04/2021 1243   BUN 89 (HH) 09/05/2020 1253   BUN 29.2 (H) 05/26/2017 0949   CREATININE 4.91 (HH) 04/04/2021 1243   CREATININE 2.24 (H) 01/27/2019 1355   CREATININE 1.4 (H) 05/26/2017 0949      Component Value Date/Time   CALCIUM 10.0 04/04/2021 1243   CALCIUM 8.9 05/26/2017 0949   ALKPHOS 152 (H) 04/04/2021 1243   ALKPHOS 157 (H) 05/26/2017 0949   AST 39 04/04/2021 1243   AST 29 05/26/2017 0949   ALT 22 04/04/2021 1243   ALT 23 05/26/2017 0949   BILITOT 1.0 04/04/2021 1243   BILITOT 2.19 (H) 05/26/2017 0949      Impression and  Plan:  65 year old woman with:   1. MDS diagnosed in 2017 with refractory anemia.  Eye disease status was updated at this time and treatment choices were reviewed.  She is currently receiving supportive transfusion in addition to reblozyl every 3 weeks.  Risks and benefits of continuing this treatment were discussed at this time.  Laboratory data in the last few weeks noted and continues to show adequate hematological parameters with normal white cell count, platelets and differential and has slightly improved hemoglobin.  At this time I recommended continuing this approach.   She is agreeable to proceed.   2.  Anemia: Remains transfusion dependent although her transfusion needs may be decreasing slightly.  We will continue to monitor and transfuse as needed.  Hemoglobin today is 8.4 and she will receive 1 unit of packed red cells.   3.  Prognosis: Overall guarded given her multiple comorbid conditions and any treatment is palliative.  Her performance status is reasonable and aggressive measures remain warranted.  4.  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  8.   Follow-up: Laboratory monitoring on a weekly basis, reblozyl every 3 weeks and MD follow-up in 9 weeks.  30  minutes were spent on this encounter.  The time was dedicated to reviewing laboratory data, disease status update, treatment choices and outlining future plan of care.   Zola Button, MD 10/3/20228:22 AM

## 2021-04-16 NOTE — Patient Instructions (Signed)
Blood Transfusion, Adult, Care After This sheet gives you information about how to care for yourself after your procedure. Your doctor may also give you more specific instructions. If you have problems or questions, contact your doctor. What can I expect after the procedure? After the procedure, it is common to have: Bruising and soreness at the IV site. A headache. Follow these instructions at home: Insertion site care   Follow instructions from your doctor about how to take care of your insertion site. This is where an IV tube was put into your vein. Make sure you: Wash your hands with soap and water before and after you change your bandage (dressing). If you cannot use soap and water, use hand sanitizer. Change your bandage as told by your doctor. Check your insertion site every day for signs of infection. Check for: Redness, swelling, or pain. Bleeding from the site. Warmth. Pus or a bad smell. General instructions Take over-the-counter and prescription medicines only as told by your doctor. Rest as told by your doctor. Go back to your normal activities as told by your doctor. Keep all follow-up visits as told by your doctor. This is important. Contact a doctor if: You have itching or red, swollen areas of skin (hives). You feel worried or nervous (anxious). You feel weak after doing your normal activities. You have redness, swelling, warmth, or pain around the insertion site. You have blood coming from the insertion site, and the blood does not stop with pressure. You have pus or a bad smell coming from the insertion site. Get help right away if: You have signs of a serious reaction. This may be coming from an allergy or the body's defense system (immune system). Signs include: Trouble breathing or shortness of breath. Swelling of the face or feeling warm (flushed). Fever or chills. Head, chest, or back pain. Dark pee (urine) or blood in the pee. Widespread rash. Fast  heartbeat. Feeling dizzy or light-headed. You may receive your blood transfusion in an outpatient setting. If so, you will be told whom to contact to report any reactions. These symptoms may be an emergency. Do not wait to see if the symptoms will go away. Get medical help right away. Call your local emergency services (911 in the [place] Do not drive yourself to the hospital. Summary Bruising and soreness at the IV site are common. Check your insertion site every day for signs of infection. Rest as told by your doctor. Go back to your normal activities as told by your doctor. Get help right away if you have signs of a serious reaction. This information is not intended to replace advice given to you by your health care provider. Make sure you discuss any questions you have with your health care provider. Document Revised: 10/26/2020 Document Reviewed: 12/24/2018 Elsevier Patient Education  2022 [employer] Inc.  

## 2021-04-17 LAB — BPAM RBC
Blood Product Expiration Date: 202210222359
ISSUE DATE / TIME: 202210031029
Unit Type and Rh: 600

## 2021-04-17 LAB — TYPE AND SCREEN
ABO/RH(D): A NEG
Antibody Screen: NEGATIVE
Unit division: 0

## 2021-04-21 ENCOUNTER — Observation Stay (HOSPITAL_COMMUNITY)
Admission: EM | Admit: 2021-04-21 | Discharge: 2021-04-23 | Disposition: A | Discharge status: Home / Self Care | Payer: Medicare Other | Attending: Internal Medicine | Admitting: Internal Medicine

## 2021-04-21 ENCOUNTER — Other Ambulatory Visit: Payer: Self-pay

## 2021-04-21 ENCOUNTER — Encounter (HOSPITAL_COMMUNITY): Payer: Self-pay | Admitting: Emergency Medicine

## 2021-04-21 ENCOUNTER — Emergency Department (HOSPITAL_COMMUNITY): Payer: Medicare Other

## 2021-04-21 DIAGNOSIS — I5033 Acute on chronic diastolic (congestive) heart failure: Secondary | ICD-10-CM | POA: Diagnosis not present

## 2021-04-21 DIAGNOSIS — I10 Essential (primary) hypertension: Secondary | ICD-10-CM | POA: Diagnosis present

## 2021-04-21 DIAGNOSIS — D631 Anemia in chronic kidney disease: Secondary | ICD-10-CM

## 2021-04-21 DIAGNOSIS — I480 Paroxysmal atrial fibrillation: Secondary | ICD-10-CM | POA: Diagnosis present

## 2021-04-21 DIAGNOSIS — Z79899 Other long term (current) drug therapy: Secondary | ICD-10-CM | POA: Diagnosis not present

## 2021-04-21 DIAGNOSIS — Z85038 Personal history of other malignant neoplasm of large intestine: Secondary | ICD-10-CM | POA: Diagnosis not present

## 2021-04-21 DIAGNOSIS — Z794 Long term (current) use of insulin: Secondary | ICD-10-CM | POA: Insufficient documentation

## 2021-04-21 DIAGNOSIS — E039 Hypothyroidism, unspecified: Secondary | ICD-10-CM | POA: Diagnosis not present

## 2021-04-21 DIAGNOSIS — D469 Myelodysplastic syndrome, unspecified: Secondary | ICD-10-CM | POA: Diagnosis present

## 2021-04-21 DIAGNOSIS — E119 Type 2 diabetes mellitus without complications: Secondary | ICD-10-CM

## 2021-04-21 DIAGNOSIS — I4891 Unspecified atrial fibrillation: Principal | ICD-10-CM | POA: Diagnosis present

## 2021-04-21 DIAGNOSIS — N186 End stage renal disease: Secondary | ICD-10-CM | POA: Diagnosis present

## 2021-04-21 DIAGNOSIS — E1122 Type 2 diabetes mellitus with diabetic chronic kidney disease: Secondary | ICD-10-CM

## 2021-04-21 DIAGNOSIS — Z20822 Contact with and (suspected) exposure to covid-19: Secondary | ICD-10-CM | POA: Diagnosis not present

## 2021-04-21 DIAGNOSIS — D649 Anemia, unspecified: Secondary | ICD-10-CM

## 2021-04-21 DIAGNOSIS — I132 Hypertensive heart and chronic kidney disease with heart failure and with stage 5 chronic kidney disease, or end stage renal disease: Secondary | ICD-10-CM | POA: Insufficient documentation

## 2021-04-21 DIAGNOSIS — Z87891 Personal history of nicotine dependence: Secondary | ICD-10-CM | POA: Insufficient documentation

## 2021-04-21 DIAGNOSIS — D500 Iron deficiency anemia secondary to blood loss (chronic): Secondary | ICD-10-CM

## 2021-04-21 DIAGNOSIS — E038 Other specified hypothyroidism: Secondary | ICD-10-CM

## 2021-04-21 DIAGNOSIS — Z992 Dependence on renal dialysis: Secondary | ICD-10-CM

## 2021-04-21 DIAGNOSIS — N184 Chronic kidney disease, stage 4 (severe): Secondary | ICD-10-CM

## 2021-04-21 DIAGNOSIS — N189 Chronic kidney disease, unspecified: Secondary | ICD-10-CM | POA: Diagnosis present

## 2021-04-21 HISTORY — DX: End stage renal disease: N18.6

## 2021-04-21 HISTORY — DX: End stage renal disease: Z99.2

## 2021-04-21 LAB — PROTIME-INR
INR: 1 (ref 0.8–1.2)
Prothrombin Time: 13.4 seconds (ref 11.4–15.2)

## 2021-04-21 LAB — CBC
HCT: 28.6 % — ABNORMAL LOW (ref 36.0–46.0)
Hemoglobin: 9.5 g/dL — ABNORMAL LOW (ref 12.0–15.0)
MCH: 30 pg (ref 26.0–34.0)
MCHC: 33.2 g/dL (ref 30.0–36.0)
MCV: 90.2 fL (ref 80.0–100.0)
Platelets: 187 10*3/uL (ref 150–400)
RBC: 3.17 MIL/uL — ABNORMAL LOW (ref 3.87–5.11)
RDW: 15.9 % — ABNORMAL HIGH (ref 11.5–15.5)
WBC: 4.4 10*3/uL (ref 4.0–10.5)
nRBC: 0 % (ref 0.0–0.2)

## 2021-04-21 LAB — BASIC METABOLIC PANEL
Anion gap: 13 (ref 5–15)
BUN: 24 mg/dL — ABNORMAL HIGH (ref 8–23)
CO2: 27 mmol/L (ref 22–32)
Calcium: 8.9 mg/dL (ref 8.9–10.3)
Chloride: 95 mmol/L — ABNORMAL LOW (ref 98–111)
Creatinine, Ser: 3.31 mg/dL — ABNORMAL HIGH (ref 0.44–1.00)
GFR, Estimated: 15 mL/min — ABNORMAL LOW (ref >60)
Glucose, Bld: 197 mg/dL — ABNORMAL HIGH (ref 70–99)
Potassium: 3.4 mmol/L — ABNORMAL LOW (ref 3.5–5.1)
Sodium: 135 mmol/L (ref 135–145)

## 2021-04-21 LAB — TROPONIN I (HIGH SENSITIVITY)
Troponin I (High Sensitivity): 36 ng/L — ABNORMAL HIGH (ref <18)
Troponin I (High Sensitivity): 45 ng/L — ABNORMAL HIGH (ref <18)

## 2021-04-21 LAB — RESP PANEL BY RT-PCR (FLU A&B, COVID) ARPGX2
Influenza A by PCR: NEGATIVE
Influenza B by PCR: NEGATIVE
SARS Coronavirus 2 by RT PCR: NEGATIVE

## 2021-04-21 LAB — APTT: aPTT: 27 seconds (ref 24–36)

## 2021-04-21 LAB — CBG MONITORING, ED: Glucose-Capillary: 224 mg/dL — ABNORMAL HIGH (ref 70–99)

## 2021-04-21 MED ORDER — CARVEDILOL 3.125 MG PO TABS: 3.1250 mg | ORAL_TABLET | Freq: Two times a day (BID) | ORAL | Status: DC

## 2021-04-21 MED ORDER — AMIODARONE HCL IN DEXTROSE 360-4.14 MG/200ML-% IV SOLN
60.0000 mg/h | INTRAVENOUS | Status: DC
  Administered 2021-04-21 – 2021-04-22 (×2): 60 mg/h via INTRAVENOUS
  Filled 2021-04-21: qty 200

## 2021-04-21 MED ORDER — SILDENAFIL CITRATE 20 MG PO TABS
20.0000 mg | ORAL_TABLET | Freq: Three times a day (TID) | ORAL | Status: DC
  Administered 2021-04-21 – 2021-04-23 (×5): 20 mg via ORAL
  Filled 2021-04-21 (×9): qty 1

## 2021-04-21 MED ORDER — DILTIAZEM HCL-DEXTROSE 125-5 MG/125ML-% IV SOLN (PREMIX)
5.0000 mg/h | INTRAVENOUS | Status: DC
  Administered 2021-04-21: 5 mg/h via INTRAVENOUS
  Filled 2021-04-21: qty 125

## 2021-04-21 MED ORDER — AMIODARONE HCL IN DEXTROSE 360-4.14 MG/200ML-% IV SOLN
30.0000 mg/h | INTRAVENOUS | Status: DC
  Administered 2021-04-22 – 2021-04-23 (×4): 30 mg/h via INTRAVENOUS
  Filled 2021-04-21 (×3): qty 200

## 2021-04-21 MED ORDER — ACETAMINOPHEN 325 MG PO TABS
650.0000 mg | ORAL_TABLET | ORAL | Status: DC | PRN
  Administered 2021-04-23: 650 mg via ORAL
  Filled 2021-04-21: qty 2

## 2021-04-21 MED ORDER — AMIODARONE LOAD VIA INFUSION
150.0000 mg | Freq: Once | INTRAVENOUS | Status: AC
  Administered 2021-04-21: 150 mg via INTRAVENOUS
  Filled 2021-04-21: qty 83.34

## 2021-04-21 MED ORDER — ATORVASTATIN CALCIUM 40 MG PO TABS
40.0000 mg | ORAL_TABLET | Freq: Every day | ORAL | Status: DC
  Administered 2021-04-22 – 2021-04-23 (×2): 40 mg via ORAL
  Filled 2021-04-21 (×2): qty 1

## 2021-04-21 MED ORDER — INSULIN ASPART 100 UNIT/ML IJ SOLN
0.0000 [IU] | Freq: Three times a day (TID) | INTRAMUSCULAR | Status: DC
Start: 2021-04-22 — End: 2021-04-23
  Administered 2021-04-22: 1 [IU] via SUBCUTANEOUS

## 2021-04-21 MED ORDER — SODIUM CHLORIDE 0.9 % IV SOLN
1.0000 g | Freq: Once | INTRAVENOUS | Status: AC
  Administered 2021-04-21: 1 g via INTRAVENOUS
  Filled 2021-04-21: qty 10

## 2021-04-21 MED ORDER — SODIUM CHLORIDE 0.9 % IV SOLN: 500.0000 mg | Freq: Once | INTRAVENOUS | Status: DC

## 2021-04-21 MED ORDER — CLONIDINE HCL 0.1 MG PO TABS
0.1000 mg | ORAL_TABLET | Freq: Two times a day (BID) | ORAL | Status: DC
  Administered 2021-04-21: 0.1 mg via ORAL
  Filled 2021-04-21: qty 1

## 2021-04-21 MED ORDER — ONDANSETRON HCL 4 MG/2ML IJ SOLN: 4.0000 mg | Freq: Four times a day (QID) | INTRAMUSCULAR | Status: DC | PRN

## 2021-04-21 MED ORDER — DIPHENHYDRAMINE HCL 25 MG PO CAPS
50.0000 mg | ORAL_CAPSULE | Freq: Once | ORAL | Status: AC
  Administered 2021-04-21: 50 mg via ORAL
  Filled 2021-04-21: qty 2

## 2021-04-21 MED ORDER — LEVOTHYROXINE SODIUM 75 MCG PO TABS
150.0000 ug | ORAL_TABLET | Freq: Every day | ORAL | Status: DC
  Administered 2021-04-22 – 2021-04-23 (×2): 150 ug via ORAL
  Filled 2021-04-21 (×2): qty 2

## 2021-04-21 MED ORDER — HEPARIN (PORCINE) 25000 UT/250ML-% IV SOLN
1200.0000 [IU]/h | INTRAVENOUS | Status: DC
  Administered 2021-04-21: 1200 [IU]/h via INTRAVENOUS
  Filled 2021-04-21: qty 250

## 2021-04-21 MED ORDER — LATANOPROST 0.005 % OP SOLN
1.0000 [drp] | Freq: Every day | OPHTHALMIC | Status: DC
  Administered 2021-04-23: 1 [drp] via OPHTHALMIC
  Filled 2021-04-21: qty 2.5

## 2021-04-21 MED ORDER — DILTIAZEM HCL 25 MG/5ML IV SOLN
20.0000 mg | Freq: Once | INTRAVENOUS | Status: AC
  Administered 2021-04-21: 20 mg via INTRAVENOUS
  Filled 2021-04-21: qty 5

## 2021-04-21 MED ORDER — SODIUM CHLORIDE 0.9 % IV SOLN
500.0000 mg | Freq: Once | INTRAVENOUS | Status: AC
  Administered 2021-04-21: 500 mg via INTRAVENOUS
  Filled 2021-04-21: qty 500

## 2021-04-21 MED ORDER — SODIUM CHLORIDE 0.9 % IV SOLN: 1.0000 g | INTRAVENOUS | Status: DC

## 2021-04-21 NOTE — Consult Note (Signed)
Cardiology Consultation:   Patient ID: Cloa Bushong MRN: [id number]; DOB: 1956-05-20  Admit date: 04/21/2021 Date of Consult: 04/21/2021  Primary Care Provider: [name], [facility] Cardiologist: Quay Burow, MD  [facility] Electrophysiologist:  None   Patient Profile:   Preethi Scantlebury is a 65 y.o. female with pAF, PH, severe AS, moderate MS, obesity, DM2, HTN, ESRD on iHD (T/R/Sa), colon CA s/p hemicolectomy/chemo with resultant MDS and transfusion dependent anemia (avg 1 unit PRBC/wk), carotid artery dz with near occlusion of RICA and mod stenosis of LICA, and HFpEF who is seen for evaluation of AF/RVR at the request of Quentin Mulling (ED PA).   History of Present Illness:   Ms. Burdell was her normal self going to bed last night however awoke 04/21/2021 with a vague feeling of chest fullness and that something was not quite right.  She got ready for dialysis which was at 7 AM this morning.  The nurses told her that her heart rate was elevated and her blood pressure was a bit lower than normal with systolics in the 44I.  She went on dialysis for 2 hours and her symptoms improved after they gave her fluid back in her SBP increased to 100-110 which is closer to her normal range.  She denied any significant chest pain, pressure, SOB, DOE, or palpitations.  She said that she has been told she had atrial fibrillation 1 time in the past in the setting of being very sick and requiring ICU care and intubation.  She is on amiodarone 200 mg p.o. daily without any nodal blockers or other antiarrhythmics.  She is not on anticoagulation secondary to ongoing transfusion dependence averaging 1 unit of PRBCs weekly in the outpatient hematology center.  She received 1 unit PRBC last Monday and her hemoglobin was 8.4.  She had 1 unit the week prior and 3 weeks ago she was able to go without any transfusion which was a welcome rarity for her.  She is actually a little higher than  her baseline hemoglobin today (bl Hb 8-9, today 9.5) and does not seem to be hemoconcentrated compared to her prior CBCs. She follows with Dr. Alen Blew at [facility] for her MDS.   In 2008 she was diagnosed with stage III colon cancer and is status post hemicolectomy in 11/2006 followed by 12 cycles of FOLFOX.  She now requires supportive blood transfusions along with Reblozyl every few weeks.   On my assessmetn (1902) she was asymptomatic and sitting comfortably in bed with her husband at bedside. VS: HR 115 (AF/RVR), BP 103/58 (71), RR 25, SpO2 97% on RA  Her dry weight ranges from 87-88 kg and today was 88 kg prior to dialysis.  She has no recent changes in her dietary or fluid intake.  She has not been sick and has no recent sick contacts.  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  She resides in a one-story house and denies any significant resting SOB, DOE, orthopnea, or PND and sleeps on her side with 2 pillows.  She has no stairs going into out of her house.  Past Medical History:  Diagnosis Date   Acute renal failure (ARF) ([place]) 06/08/2018   TTHS- [address]   Carotid artery disease [facility])    Chronic diastolic (congestive) heart failure ([place])    Colon cancer ([place]) 2008   Diabetes mellitus ([place])    Dyspnea    Gout 09/08/2018  Heart attack ([place])    Heart murmur    Hypertension    Hypothyroidism    Macrocytic anemia    MDS (myelodysplastic syndrome) (HCC)    Moderate mitral stenosis    PAD (peripheral artery disease) (HCC)    PAF (paroxysmal atrial fibrillation) (HCC)    Pulmonary hypertension (HCC)    Renal artery stenosis (HCC)    a. >60% R renal artery stenosis by duplex 2021, managed medically.   Severe aortic stenosis    Stage 4 chronic kidney disease (HCC)    Transfusion-dependent anemia    Past Surgical History:  Procedure Laterality Date   AV FISTULA PLACEMENT Right 09/26/2020   Procedure: RIGHT ARM  ARTERIOVENOUS (AV) FISTULA CREATION;  Surgeon: Waynetta Sandy, MD;  Location: [place];  Service: Vascular;  Laterality: Right;  Monitor Anesthesia Care with Regional Block to Right Arm    [place] Right 12/13/2020   Procedure: RIGHT UPPER EXTREMITY ARTERIOVENOUS FISTULA TRANSPOSITION;  Surgeon: Waynetta Sandy, MD;  Location: [place];  Service: Vascular;  Laterality: Right;   COLON SURGERY     COLONOSCOPY WITH PROPOFOL N/A 02/22/2021   Procedure: COLONOSCOPY WITH PROPOFOL;  Surgeon: Otis Brace, MD;  Location: [place];  Service: Gastroenterology;  Laterality: N/A;   DEBRIDMENT OF DECUBITUS ULCER N/A 06/12/2018   Procedure: DEBRIDMENT OF DECUBITUS ULCER;  Surgeon: Wallace Going, DO;  Location: [facility];  Service: Plastics;  Laterality: N/A;   ENTEROSCOPY N/A 02/22/2021   Procedure: ENTEROSCOPY;  Surgeon: Otis Brace, MD;  Location: [facility];  Service: Gastroenterology;  Laterality: N/A;   ESOPHAGOGASTRODUODENOSCOPY N/A 09/01/2020   Procedure: ESOPHAGOGASTRODUODENOSCOPY (EGD);  Surgeon: Clarene Essex, MD;  Location: [facility];  Service: Endoscopy;  Laterality: N/A;   GIVENS CAPSULE STUDY N/A 09/01/2020   Procedure: GIVENS CAPSULE STUDY;  Surgeon: Clarene [place], MD;  Location: [facility];  Service: Endoscopy;  Laterality: N/A;   HEMOSTASIS CLIP PLACEMENT  02/22/2021   Procedure: HEMOSTASIS CLIP PLACEMENT;  Surgeon: Otis Brace, MD;  Location: [place];  Service: Gastroenterology;;   HOT HEMOSTASIS N/A 02/22/2021   Procedure: HOT HEMOSTASIS (ARGON PLASMA COAGULATION/BICAP);  Surgeon: Otis Brace, MD;  Location: [facility];  Service: Gastroenterology;  Laterality: N/A;   IR FLUORO GUIDE CV LINE RIGHT  10/11/2020   IR FLUORO GUIDE PORT INSERTION RIGHT  04/02/2017   IR REMOVAL TUN ACCESS W/ PORT W/O FL MOD SED  03/16/2019   IR US GUIDE VASC ACCESS RIGHT  04/02/2017   IR US GUIDE VASC ACCESS RIGHT  10/11/2020   LIGATION OF COMPETING  BRANCHES OF ARTERIOVENOUS FISTULA Right 12/13/2020   Procedure: LIGATION OF COMPETING BRANCHES OF RIGHT UPPER EXTREMITY ARTERIOVENOUS FISTULA;  Surgeon: Waynetta Sandy, MD;  Location: [place];  Service: Vascular;  Laterality: Right;   POLYPECTOMY  02/22/2021   Procedure: POLYPECTOMY;  Surgeon: Otis Brace, MD;  Location: [place] [facility];  Service: Gastroenterology;;   RIGHT HEART CATH N/A 04/23/2019   Procedure: RIGHT HEART CATH;  Surgeon: Jolaine Artist, MD;  Location: [place] [facility];  Service: Cardiovascular;  Laterality: N/A;   RIGHT/LEFT HEART CATH AND CORONARY ANGIOGRAPHY N/A 05/21/2018   Procedure: RIGHT/LEFT HEART CATH AND CORONARY ANGIOGRAPHY;  Surgeon: Nelva Bush, MD;  Location: [place] [facility];  Service: Cardiovascular;  Laterality: N/A;   RIGHT/LEFT HEART CATH AND CORONARY ANGIOGRAPHY N/A 10/13/2020   Procedure: RIGHT/LEFT HEART CATH AND CORONARY ANGIOGRAPHY;  Surgeon: Jolaine Artist, MD;  Location: [place] [facility];  Service: Cardiovascular;  Laterality: N/A;  TUBAL LIGATION      Home Medications:  Prior to Admission medications   Medication Sig Start Date End Date Taking? Authorizing Provider  allopurinol (ZYLOPRIM) 300 MG tablet Take 300 mg by mouth daily. 07/05/20  Yes [provider]  amiodarone (PACERONE) 200 MG tablet Take 1 tablet (200 mg total) by mouth daily. 07/19/20  Yes Bensimhon, Shaune Pascal, MD  atorvastatin (LIPITOR) 40 MG tablet Take 1 tablet by mouth once daily 11/13/20  Yes [name], FNP  carvedilol (COREG) 3.125 MG tablet Take 1 tablet by mouth twice daily 04/16/21  Yes Clegg, Amy D, NP  cholecalciferol (VITAMIN D3) 25 MCG (1000 UNIT) tablet Take 1,000 Units by mouth daily.   Yes [provider]  cloNIDine (CATAPRES) 0.1 MG tablet Take 1 tablet (0.1 mg total) by mouth 2 (two) times daily. 07/03/20  Yes Bensimhon, Shaune Pascal, MD  hydrALAZINE (APRESOLINE) 100 MG tablet Take 1 tablet (100 mg total) by mouth 3 (three) times  daily. 09/27/20  Yes Lorretta Harp, MD  Insulin Glargine [facility]) 100 UNIT/ML Inject 15 Units into the skin daily as needed (only if blood sugar over 150). Only if above 150   Yes [provider]  latanoprost (XALATAN) 0.005 % ophthalmic solution Place 1 drop into both eyes at bedtime.    Yes [provider]  levothyroxine (SYNTHROID) 150 MCG tablet TAKE 1 TABLET BY MOUTH ONCE DAILY BEFORE BREAKFAST 04/16/21  Yes [name], FNP  lidocaine-prilocaine (EMLA) cream Apply 1 application topically daily as needed (port access). 01/23/21  Yes [provider]  linagliptin (TRADJENTA) 5 MG TABS tablet Take 1 tablet (5 mg total) by mouth daily. 04/05/21  Yes [name], FNP  Multiple Vitamins-Minerals (CENTRUM SILVER 50+WOMEN) TABS Take 1 tablet by mouth daily.   Yes [provider]  polyethylene glycol (MIRALAX / GLYCOLAX) 17 g packet Take 17 g by mouth daily as needed for moderate constipation. 01/14/21  Yes Vann, Jessica U, DO  sildenafil (REVATIO) 20 MG tablet TAKE 1 TABLET BY MOUTH THREE TIMES DAILY Patient taking differently: Take 20 mg by mouth 3 (three) times daily. 02/05/21  Yes Sherren Mocha, MD  blood glucose meter kit and supplies Dispense based on patient and insurance preference. Use up to four times daily as directed. (FOR ICD-10 E10.9, E11.9). 11/04/18   [name], FNP  Blood Glucose Monitoring Suppl [facility] Karsten Fells) w/Device KIT Check blood sugars twice daily E11.9 11/30/19   [name], FNP  glucose blood test strip check blood sugars twice daily Dx code E11.22 03/02/20   [name], FNP  Insulin Pen Needle 29G X 5MM MISC Use as directed 06/16/17   Bonnielee Haff, MD  Lancets (ONETOUCH DELICA PLUS [id number]) [place] check blood sugars twice daily Dx code: E11.22 03/02/20   [name], FNP  omeprazole (PRILOSEC) 40 MG capsule Take 1 capsule (40 mg total) by mouth 2 (two) times daily. Patient not taking: Reported on 04/21/2021 04/16/21    [name], FNP   Inpatient Medications: Scheduled Meds:  Continuous Infusions:  diltiazem (CARDIZEM) infusion 10 mg/hr (04/21/21 1744)   heparin 1,200 Units/hr (04/21/21 1614)   PRN Meds:  Allergies:    Allergies  Allergen Reactions   Lactose Intolerance (Gi) Diarrhea   Morphine And Related Itching   Sulfa Antibiotics Rash and Other (See Comments)    Blisters, also   Social History:   Social History   Socioeconomic History   Marital status: Married    Spouse name: Not on file  Number of children: Not on file   Years of education: Not on file   Highest education level: Not on file  Occupational History   Occupation: retired  Tobacco Use   Smoking status: Former    Packs/day: 0.25    Years: 20.00    Pack years: 5.00    Types: Cigarettes    Quit date: 1997    Years since quitting: 25.7   Smokeless tobacco: Former  Scientific laboratory technician Use: Never used  Substance and Sexual Activity   Alcohol use: Yes    Alcohol/week: 1.0 standard drink    Types: 1 Standard drinks or equivalent per week    Comment: occ   Drug use: Never   Sexual activity: Not on file  Other Topics Concern   Not on file  Social History Narrative   Not on file   Social Determinants of Health   Financial Resource Strain: Low Risk    Difficulty of Paying Living Expenses: Not hard at all  Food Insecurity: No Food Insecurity   Worried About Charity fundraiser in the Last Year: Never true   [place] in the Last Year: Never true  Transportation Needs: No Transportation Needs   Lack of Transportation (Medical): No   Lack of Transportation (Non-Medical): No  Physical Activity: Inactive   Days of Exercise per Week: 0 days   Minutes of Exercise per Session: 0 min  Stress: No Stress Concern Present   Feeling of Stress : Not at all  Social Connections: Not on file  Intimate Partner Violence: Not on file    Family History:    Family History  Problem Relation Age of Onset    Hypertension Mother    Diabetes Mother    Cervical cancer Mother    Heart attack Father    Hypertension Sister    Hypertension Brother    Hypertension Sister    Hypertension Sister    Prostate cancer Brother    HIV/AIDS Brother     ROS:  Review of Systems: [y] = yes, '[ ]'  = no      General: Weight gain '[ ]' ; Weight loss '[ ]' ; Anorexia '[ ]' ; Fatigue '[ ]' ; Fever '[ ]' ; Chills '[ ]' ; Weakness '[ ]'    Cardiac: Chest pain/pressure '[ ]' ; Resting SOB '[ ]' ; Exertional SOB '[ ]' ; Orthopnea '[ ]' ; Pedal Edema '[ ]' ; Palpitations '[ ]' ; Syncope '[ ]' ; Presyncope '[ ]' ; Paroxysmal nocturnal dyspnea '[ ]'    Pulmonary: Cough '[ ]' ; Wheezing '[ ]' ; Hemoptysis '[ ]' ; Sputum '[ ]' ; Snoring '[ ]'    GI: Vomiting '[ ]' ; Dysphagia '[ ]' ; Melena '[ ]' ; Hematochezia '[ ]' ; Heartburn '[ ]' ; Abdominal pain '[ ]' ; Constipation '[ ]' ; Diarrhea '[ ]' ; BRBPR '[ ]'    GU: Hematuria '[ ]' ; Dysuria '[ ]' ; Nocturia '[ ]'  Vascular: Pain in legs with walking '[ ]' ; Pain in feet with lying flat '[ ]' ; Non-healing sores '[ ]' ; Stroke '[ ]' ; TIA '[ ]' ; Slurred speech '[ ]' ;   Neuro: Headaches '[ ]' ; Vertigo '[ ]' ; Seizures '[ ]' ; Paresthesias '[ ]' ;Blurred vision '[ ]' ; Diplopia '[ ]' ; Vision changes '[ ]'    Ortho/Skin: Arthritis '[ ]' ; Joint pain '[ ]' ; Muscle pain '[ ]' ; Joint swelling '[ ]' ; Back Pain '[ ]' ; Rash '[ ]'    Psych: Depression '[ ]' ; Anxiety '[ ]'    Heme: Bleeding problems '[ ]' ; Clotting disorders '[ ]' ; Anemia '[ ]'    Endocrine: Diabetes '[ ]' ; Thyroid dysfunction '[ ]'   Physical Exam/Data:   Vitals:   04/21/21 1630 04/21/21 1713 04/21/21 1730 04/21/21 1800  BP: (!) 142/53  (!) 161/57 (!) 149/59  Pulse: (!) 113  (!) 112 (!) 111  Resp: (!) '27 13 19 14  ' Temp:      TempSrc:      SpO2: 100%  100% 100%  Weight:      Height:        Intake/Output Summary (Last 24 hours) at 04/21/2021 1902 Last data filed at 04/21/2021 1730 Gross per 24 hour  Intake 350 ml  Output --  Net 350 ml   Last 3 Weights 04/21/2021 04/16/2021 03/26/2021  Weight (lbs) 196 lb 6.9 oz 196 lb 8 oz 199 lb  Weight (kg) 89.1 kg 89.132 kg 90.266  kg     Body mass index is 33.72 kg/m.  General:  Well nourished, well developed, in no acute distress HEENT: normal Lymph: no adenopathy Neck: no JVD Endocrine:  No thryomegaly Vascular: No carotid bruits; FA pulses 2+ bilaterally without bruits  Cardiac: accelerated rate, irregular rhythm, 3/6 systolic murmur best hear at RUSB with radiation to b/l CA Lungs:  clear to auscultation bilaterally, no wheezing, rhonchi or rales  Abd: soft, nontender, no hepatomegaly  Ext: no edema Musculoskeletal:  No deformities, BUE and BLE strength normal and equal Skin: warm and dry, RUE fistula with bruit and good thrill  Neuro:  CNs 2-12 intact, no focal abnormalities noted Psych:  Normal affect   EKG:  The EKG was personally reviewed and demonstrates: AF/RVR (HR 137 on arrival), no signs of ischemia  Telemetry:  Telemetry was personally reviewed and demonstrates: AF/RVR rate 110-115 on diltiazem 15 mg/h  Relevant CV Studies:  Coronary angiography/RHC Result date: 10/13/20 1. Significant CAD with 60% mid LM disease. O/w non-obstructive CAD 2. EF 60% 3. Moderate to severe pulmonary HTN due to high output 4. Moderate to severe AS   TTE Result date: 10/09/20  1. Left ventricular ejection fraction, by estimation, is 50 to 55%. The  left ventricle has low normal function. The left ventricle has no regional  wall motion abnormalities. The left ventricular internal cavity size was  mildly dilated. Left ventricular  diastolic parameters are indeterminate.   2. Right ventricular systolic function is mildly reduced. The right  ventricular size is normal. There is severely elevated pulmonary artery  systolic pressure. The estimated right ventricular systolic pressure is  92.1 mmHg.   3. Left atrial size was mildly dilated.   4. Right atrial size was mildly dilated.   5. The mitral valve is degenerative. Trivial mitral valve regurgitation.  Moderate mitral stenosis. The mean mitral valve gradient  is 7.0 mmHg with  MVA 1.5 cm^2 by VTI. Moderate to severe mitral annular calcification.   6. The aortic valve is tricuspid. Aortic valve regurgitation is mild.  Severe aortic valve stenosis. Aortic valve area, by VTI measures 0.71 cm.  Aortic valve mean gradient measures 65.0 mmHg.   7. The inferior vena cava is dilated in size with >50% respiratory  variability, suggesting right atrial pressure of 8 mmHg.   8. The patient was in atrial fibrillation.   Laboratory Data:  High Sensitivity Troponin:   Recent Labs  Lab 04/21/21 1017 04/21/21 1211  TROPONINIHS 36* 45*     Chemistry Recent Labs  Lab 04/21/21 1017  NA 135  K 3.4*  CL 95*  CO2 27  GLUCOSE 197*  BUN 24*  CREATININE 3.31*  CALCIUM 8.9  GFRNONAA 15*  ANIONGAP 13  No results for input(s): PROT, ALBUMIN, AST, ALT, ALKPHOS, BILITOT in the last 168 hours. Hematology Recent Labs  Lab 04/16/21 0816 04/21/21 1017  WBC 4.1 4.4  RBC 2.82* 3.17*  HGB 8.4* 9.5*  HCT 25.3* 28.6*  MCV 89.7 90.2  MCH 29.8 30.0  MCHC 33.2 33.2  RDW 15.9* 15.9*  PLT 155 187   BNPNo results for input(s): BNP, PROBNP in the last 168 hours.  DDimer No results for input(s): DDIMER in the last 168 hours.  Radiology/Studies:  DG Chest 2 View  Result Date: 04/21/2021 CLINICAL DATA:  Atrial fibrillation and diabetes. EXAM: CHEST - 2 VIEW COMPARISON:  December 26, 2020 FINDINGS: The heart size and mediastinal contours are stable. Mild opacity of the medial right lung base noted. There is no pulmonary edema or pleural effusion. The visualized skeletal structures are unremarkable. IMPRESSION: Mild opacity of the medial right lung base, developing pneumonia is not excluded. Electronically Signed   By: Abelardo Diesel M.D.   On: 04/21/2021 10:41   { Assessment and Plan:   AF/RVR Ms. Ziff presents with AF/RVR in the setting of dialysis and seems to be somewhat symptomatic when she was mildly hypotensive however does not have good rhythm awareness  with relationship to her AF.  She had 1 prior episode of AF in the setting of hospitalization for sepsis resulting in ICU level care and intubation (05/11/18). She has been in NSR on ECG I reviewed since then, some call AF on auto read in March but seems more c/w very prolonged 1st deg AVB and frequency PACs then. Either way she has prior hx of AF and a very elevated C2V (6: female, age, HF, vascular disease, DM) so normally would recommend anticoagulation and followed by cardioversion and consideration of future ablation given her young age however she has multiple complicating comorbidities the most prominent of which is her MDS requiring weekly transfusions.  She also had melena with anemia to Hb 5.0 on 02/20/21 for which she was found to have multiple angioectasias throughout stomach/small bowel tx with APC and 2 clips, along with polyp removal. She has degenerative moderate MS and severe AS along with severe PAH.  Given valvular AF optimal management with warfarin however despite her significantly elevated CHA2DS2-VASc score she could not safely be on warfarin while transfusion dependent.  She is also not in any way candidate for LAAO given her severe PH so we really do not have options for stroke risk reduction secondary to her comorbidities.  Given that I do not think it would be ideal to cardiovert her given her risk of stroke post cardioversion with valvular AF.  The majority of rhythm medications are not an option for her given her renal disease and underlying CAD with prior NSTEMI.  Excepting the risk of amiodarone for chemical cardioversion this is really her only option along with adjunctive nodal blocking agents.  She has already been started on diltiazem which I think is fine given that her EF was recently 50%.  Recommended discontinuation of heparin and initiation of reloading amiodarone along with concomitant diltiazem to see if we can get her rates a little better controlled.  If she continues to  be asymptomatic and her heart rate is better controlled she can be continued on amiodarone load as an outpatient with oral long-acting diltiazem. - discontinue heparin gtt, would not plan for DCCV as patient cannot tolerate OAC and is at high risk for CVA following this with valvular AF not on [place] - start  amio IV load - continue dilt, consolidate to dilt CD once HR better controlled  - recheck TSH (last 1.98 on 01/31/21, has been mildly elevated in the past), AST/ALT 39/22 on 04/04/21  For questions or updates, please contact [place] Please consult [email or web address] for contact info under   Signed, Dion Body, MD  04/21/2021 7:02 PM

## 2021-04-21 NOTE — ED Triage Notes (Signed)
Pt to triage via [facility] from dialysis.  Woke up feeling bad this morning.  Only able to complete 2 of 4 hrs of dialysis.  EMS called for tachycardia.  New onset Afib.  Denies chest pain, sob, dizziness.  20g L hand.

## 2021-04-21 NOTE — H&P (Signed)
History and Physical   Patricia Mata VZD:[id number] DOB: 04-Feb-1956 DOA: 04/21/2021  PCP: [name], FNP   Patient coming from: Home, HD center  Chief Complaint: Palpitations, Afib at HD  HPI: Senetra Dillin is a 65 y.o. female with medical history significant of abnormal uterine bleeding, CHF, anemia, CAD, aortic stenosis, carotid artery disease, diabetes, hypertension, gout, hyperlipidemia, hypothyroidism, colon cancer history, MDS on palliative treatment, prior episode of A. fib who presents from her HD session after having lower blood pressures and found to be in A. fib with RVR.  She reports some palpitations and a funny feeling of feeling unwell when she woke up this morning but otherwise denies any other symptoms.  Specifically she denies fever, chills, chest pain, shortness of breath, abdominal pain, constipation, diarrhea, nausea, vomiting.  ED Course: Vital signs in the ED significant for initial heart rate in the 130s with improvement now to the 110s.  Respiratory rate in the teens to 20s, saturating well on room air.  Lab work-up showed BMP with potassium 3.4, BUN 24, creatinine stable at 3.31, chloride 95, glucose 197.  CBC showed hemoglobin stable at 9.5.  PTT, PT, INR within normal limits.  Troponin mildly elevated at 36 and 45 respectively.  Rest were panel flu COVID-negative chest x-ray showed mild opacity at the right lung base unable to exclude developing pneumonia. Received dose of ceftriaxone azithromycin in the ED was also started on a dill drip and a heparin drip for her A. fib.  Cardiology was consulted and are leaving recommendations.  Review of Systems: As per HPI otherwise all other systems reviewed and are negative.  Past Medical History:  Diagnosis Date   Acute renal failure (ARF) ([place]) 06/08/2018   TTHS- [place]   Carotid artery disease [facility])    Chronic diastolic (congestive) heart failure (HCC)    Colon cancer ([place]) 2008   Diabetes mellitus  ([place])    Dyspnea    Gout 09/08/2018   Heart attack ([place])    Heart murmur    Hypertension    Hypothyroidism    Macrocytic anemia    MDS (myelodysplastic syndrome) (HCC)    Moderate mitral stenosis    PAD (peripheral artery disease) (HCC)    PAF (paroxysmal atrial fibrillation) (HCC)    Pulmonary hypertension (HCC)    Renal artery stenosis (HCC)    a. >60% R renal artery stenosis by duplex 2021, managed medically.   Severe aortic stenosis    Stage 4 chronic kidney disease (HCC)    Transfusion-dependent anemia     Past Surgical History:  Procedure Laterality Date   AV FISTULA PLACEMENT Right 09/26/2020   Procedure: RIGHT ARM ARTERIOVENOUS (AV) FISTULA CREATION;  Surgeon: Waynetta Sandy, MD;  Location: [place];  Service: Vascular;  Laterality: Right;  Monitor Anesthesia Care with Regional Block to Right Arm    [place] Right 12/13/2020   Procedure: RIGHT UPPER EXTREMITY ARTERIOVENOUS FISTULA TRANSPOSITION;  Surgeon: Waynetta Sandy, MD;  Location: [place];  Service: Vascular;  Laterality: Right;   COLON SURGERY     COLONOSCOPY WITH PROPOFOL N/A 02/22/2021   Procedure: COLONOSCOPY WITH PROPOFOL;  Surgeon: Otis Brace, MD;  Location: [place];  Service: Gastroenterology;  Laterality: N/A;   DEBRIDMENT OF DECUBITUS ULCER N/A 06/12/2018   Procedure: DEBRIDMENT OF DECUBITUS ULCER;  Surgeon: Wallace Going, DO;  Location: [facility];  Service: Plastics;  Laterality: N/A;   ENTEROSCOPY N/A 02/22/2021   Procedure: ENTEROSCOPY;  Surgeon: Otis Brace, MD;  [facility];  Service: Gastroenterology;  Laterality: N/A;   ESOPHAGOGASTRODUODENOSCOPY N/A 09/01/2020   Procedure: ESOPHAGOGASTRODUODENOSCOPY (EGD);  Surgeon: Clarene Essex, MD;  Location: [facility];  Service: Endoscopy;  Laterality: N/A;   GIVENS CAPSULE STUDY N/A 09/01/2020   Procedure: GIVENS CAPSULE STUDY;  Surgeon: Clarene Essex, MD;  Location: [facility];  Service: Endoscopy;   Laterality: N/A;   HEMOSTASIS CLIP PLACEMENT  02/22/2021   Procedure: HEMOSTASIS CLIP PLACEMENT;  Surgeon: Otis Brace, MD;  Location: [place];  Service: Gastroenterology;;   HOT HEMOSTASIS N/A 02/22/2021   Procedure: HOT HEMOSTASIS (ARGON PLASMA COAGULATION/BICAP);  Surgeon: Otis Brace, MD;  Location: [facility];  Service: Gastroenterology;  Laterality: N/A;   IR FLUORO GUIDE CV LINE RIGHT  10/11/2020   IR FLUORO GUIDE PORT INSERTION RIGHT  04/02/2017   IR REMOVAL TUN ACCESS W/ PORT W/O FL MOD SED  03/16/2019   IR US GUIDE VASC ACCESS RIGHT  04/02/2017   IR US GUIDE VASC ACCESS RIGHT  10/11/2020   LIGATION OF COMPETING BRANCHES OF ARTERIOVENOUS FISTULA Right 12/13/2020   Procedure: LIGATION OF COMPETING BRANCHES OF RIGHT UPPER EXTREMITY ARTERIOVENOUS FISTULA;  Surgeon: Waynetta Sandy, MD;  Location: [place];  Service: Vascular;  Laterality: Right;   POLYPECTOMY  02/22/2021   Procedure: POLYPECTOMY;  Surgeon: Otis Brace, MD;  Location: [place] [facility];  Service: Gastroenterology;;   RIGHT HEART CATH N/A 04/23/2019   Procedure: RIGHT HEART CATH;  Surgeon: Jolaine Artist, MD;  Location: [place] [facility];  Service: Cardiovascular;  Laterality: N/A;   RIGHT/LEFT HEART CATH AND CORONARY ANGIOGRAPHY N/A 05/21/2018   Procedure: RIGHT/LEFT HEART CATH AND CORONARY ANGIOGRAPHY;  Surgeon: Nelva Bush, MD;  Location: [place] [facility];  Service: Cardiovascular;  Laterality: N/A;   RIGHT/LEFT HEART CATH AND CORONARY ANGIOGRAPHY N/A 10/13/2020   Procedure: RIGHT/LEFT HEART CATH AND CORONARY ANGIOGRAPHY;  Surgeon: Jolaine Artist, MD;  Location: [place] [facility];  Service: Cardiovascular;  Laterality: N/A;   TUBAL LIGATION      Social History  reports that she quit smoking about 25 years ago. Her smoking use included cigarettes. She has a 5.00 pack-year smoking history. She has quit using smokeless tobacco. She reports current alcohol use of about 1.0 standard drink per  week. She reports that she does not use drugs.  Allergies  Allergen Reactions   Lactose Intolerance (Gi) Diarrhea   Morphine And Related Itching   Sulfa Antibiotics Rash and Other (See Comments)    Blisters, also    Family History  Problem Relation Age of Onset   Hypertension Mother    Diabetes Mother    Cervical cancer Mother    Heart attack Father    Hypertension Sister    Hypertension Brother    Hypertension Sister    Hypertension Sister    Prostate cancer Brother    HIV/AIDS Brother   Reviewed on admission  Prior to Admission medications   Medication Sig Start Date End Date Taking? Authorizing Provider  allopurinol (ZYLOPRIM) 300 MG tablet Take 300 mg by mouth daily. 07/05/20  Yes [provider]  amiodarone (PACERONE) 200 MG tablet Take 1 tablet (200 mg total) by mouth daily. 07/19/20  Yes Bensimhon, Shaune Pascal, MD  atorvastatin (LIPITOR) 40 MG tablet Take 1 tablet by mouth once daily 11/13/20  Yes [name], FNP  carvedilol (COREG) 3.125 MG tablet Take 1 tablet by mouth twice daily 04/16/21  Yes Clegg, Amy D, NP  cholecalciferol (VITAMIN D3) 25 MCG (1000 UNIT) tablet Take  1,000 Units by mouth daily.   Yes [provider]  cloNIDine (CATAPRES) 0.1 MG tablet Take 1 tablet (0.1 mg total) by mouth 2 (two) times daily. 07/03/20  Yes Bensimhon, Shaune Pascal, MD  hydrALAZINE (APRESOLINE) 100 MG tablet Take 1 tablet (100 mg total) by mouth 3 (three) times daily. 09/27/20  Yes Lorretta Harp, MD  Insulin Glargine [facility]) 100 UNIT/ML Inject 15 Units into the skin daily as needed (only if blood sugar over 150). Only if above 150   Yes [provider]  latanoprost (XALATAN) 0.005 % ophthalmic solution Place 1 drop into both eyes at bedtime.    Yes [provider]  levothyroxine (SYNTHROID) 150 MCG tablet TAKE 1 TABLET BY MOUTH ONCE DAILY BEFORE BREAKFAST 04/16/21  Yes [name], FNP  lidocaine-prilocaine (EMLA) cream Apply 1 application  topically daily as needed (port access). 01/23/21  Yes [provider]  linagliptin (TRADJENTA) 5 MG TABS tablet Take 1 tablet (5 mg total) by mouth daily. 04/05/21  Yes [name], FNP  Multiple Vitamins-Minerals (CENTRUM SILVER 50+WOMEN) TABS Take 1 tablet by mouth daily.   Yes [provider]  polyethylene glycol (MIRALAX / GLYCOLAX) 17 g packet Take 17 g by mouth daily as needed for moderate constipation. 01/14/21  Yes Vann, Jessica U, DO  sildenafil (REVATIO) 20 MG tablet TAKE 1 TABLET BY MOUTH THREE TIMES DAILY Patient taking differently: Take 20 mg by mouth 3 (three) times daily. 02/05/21  Yes Sherren Mocha, MD  blood glucose meter kit and supplies Dispense based on patient and insurance preference. Use up to four times daily as directed. (FOR ICD-10 E10.9, E11.9). 11/04/18   [name], FNP  Blood Glucose Monitoring Suppl [facility] Karsten Fells) w/Device KIT Check blood sugars twice daily E11.9 11/30/19   [name], FNP  glucose blood test strip check blood sugars twice daily Dx code E11.22 03/02/20   [name], FNP  Insulin Pen Needle 29G X 5MM MISC Use as directed 06/16/17   Bonnielee Haff, MD  Lancets (ONETOUCH DELICA PLUS [id number]) [place] check blood sugars twice daily Dx code: E11.22 03/02/20   [name], FNP  omeprazole (PRILOSEC) 40 MG capsule Take 1 capsule (40 mg total) by mouth 2 (two) times daily. Patient not taking: Reported on 04/21/2021 04/16/21   [name], FNP    Physical Exam: Vitals:   04/21/21 1730 04/21/21 1800 04/21/21 1920 04/21/21 2000  BP: (!) 161/57 (!) 149/59 132/60 (!) 122/38  Pulse: (!) 112 (!) 111 (!) 112   Resp: _0 (!) 21  Temp:      TempSrc:      SpO2: 100% 100% 99%   Weight:      Height:       Physical Exam Constitutional:      General: She is not in acute distress.    Appearance: Normal appearance. She is obese.  HENT:     Head: Normocephalic and atraumatic.     Mouth/Throat:     Mouth: Mucous membranes are  moist.     Pharynx: Oropharynx is clear.  Eyes:     Extraocular Movements: Extraocular movements intact.     Pupils: Pupils are equal, round, and reactive to light.  Cardiovascular:     Rate and Rhythm: Tachycardia present. Rhythm irregular.     Pulses: Normal pulses.     Heart sounds: Murmur heard.  Pulmonary:     Effort: Pulmonary effort is normal. No respiratory distress.     Breath sounds: Normal breath  sounds.  Abdominal:     General: Bowel sounds are normal. There is no distension.     Palpations: Abdomen is soft.     Tenderness: There is no abdominal tenderness.  Musculoskeletal:        General: No swelling or deformity.  Skin:    General: Skin is warm and dry.  Neurological:     General: No focal deficit present.     Mental Status: Mental status is at baseline.   Labs on Admission: I have personally reviewed following labs and imaging studies  CBC: Recent Labs  Lab 04/16/21 0816 04/21/21 1017  WBC 4.1 4.4  NEUTROABS 2.7  --   HGB 8.4* 9.5*  HCT 25.3* 28.6*  MCV 89.7 90.2  PLT 155 850    Basic Metabolic Panel: Recent Labs  Lab 04/21/21 1017  NA 135  K 3.4*  CL 95*  CO2 27  GLUCOSE 197*  BUN 24*  CREATININE 3.31*  CALCIUM 8.9    GFR: Estimated Creatinine Clearance: 18.1 mL/min (A) (by C-G formula based on SCr of 3.31 mg/dL (H)).  Liver Function Tests: No results for input(s): AST, ALT, ALKPHOS, BILITOT, PROT, ALBUMIN in the last 168 hours.  Urine analysis:    Component Value Date/Time   COLORURINE AMBER (A) 10/09/2020 2320   APPEARANCEUR CLOUDY (A) 10/09/2020 2320   LABSPEC 1.016 10/09/2020 2320   PHURINE 5.0 10/09/2020 2320   GLUCOSEU NEGATIVE 10/09/2020 2320   HGBUR SMALL (A) 10/09/2020 2320   BILIRUBINUR NEGATIVE 10/09/2020 2320   BILIRUBINUR negative 05/11/2020 1152   KETONESUR NEGATIVE 10/09/2020 2320   PROTEINUR 100 (A) 10/09/2020 2320   UROBILINOGEN 0.2 05/11/2020 1152   NITRITE NEGATIVE 10/09/2020 2320   LEUKOCYTESUR MODERATE (A)  10/09/2020 2320    Radiological Exams on Admission: DG Chest 2 View  Result Date: 04/21/2021 CLINICAL DATA:  Atrial fibrillation and diabetes. EXAM: CHEST - 2 VIEW COMPARISON:  December 26, 2020 FINDINGS: The heart size and mediastinal contours are stable. Mild opacity of the medial right lung base noted. There is no pulmonary edema or pleural effusion. The visualized skeletal structures are unremarkable. IMPRESSION: Mild opacity of the medial right lung base, developing pneumonia is not excluded. Electronically Signed   By: Abelardo Diesel M.D.   On: 04/21/2021 10:41    EKG: Independently reviewed.  Atrial fibrillation with RVR at 137 bpm.  Assessment/Plan Principal Problem:   Atrial fibrillation with RVR (HCC) Active Problems:   Anemia due to chronic kidney disease   MDS (myelodysplastic syndrome) (HCC)   Essential hypertension   PAF (paroxysmal atrial fibrillation) (HCC)   Hypothyroidism   DM2 (diabetes mellitus, type 2) (HCC)   ESRD (end stage renal disease) (HCC)   A. fib with RVR > Patient felt unwell this morning possibly some palpitations went to her HD session and after a couple hours noted to have lower blood pressures and found to be in A. fib with RVR. > 1 prior episode when she was previously diagnosed with sepsis not currently on anticoagulation. > Has responded to diltiazem in the ED which is being transitioned to amiodarone drip per cardiology.  Also started on heparin drip and cardiology has been consulted. > Unclear trigger, possible pneumonia on chest x-ray though this seems less likely given lack of respiratory symptoms nor leukocytosis. - Monitor on progressive unit - Appreciate cardiology recommendations - Continue with amiodarone drip - Stop heparin per cards recs - Echocardiogram  - Check magnesium and TSH - Hold home amiodarone while we determine  appropriate dose based on drip  ?PNA Opacity on chest x-ray > Subtle mild opacity on chest x-ray no leukocytosis  nor symptoms. - Hold off on further antibiotics for now  ESRD on HD TTS > Finished partial HD session today no urgent need for dialysis will need nephrology consult if here through Tuesday or further needs for dialysis arise. - Avoid nephrotoxic agents - Trend renal function and electrolytes - We will work to determine what chronic medication she is taking such as binders, she does not report taking any.  CHF CAD Hypertension > Last echo in March showed EF 50-55% and mildly reduced RV function - Continue home carvedilol - Continue home clonidine - Holding hydralazine for now  Hypothyroidism - Continue home Synthroid  Pulmonary hypertension - Continue home Revatio  Diabetes - On insulin outpatient - SSI   Hyperlipidemia - Continue home statin  History of colon cancer History of myelodysplastic syndrome > Currently on palliative treatment only for myelodysplastic syndrome - Counts look normal/stable we will continue to monitor.  DVT prophylaxis: Heparin  Code Status:   Full  Family Communication:  None on admission  Disposition Plan:   Patient is from:  Home  Anticipated DC to:  Home  Anticipated DC date:  1 to 3 days  Anticipated DC barriers: None  Consults called:  Cardiology consulted by EDP  Admission status:  Observation, progressive   Severity of Illness: The appropriate patient status for this patient is OBSERVATION. Observation status is judged to be reasonable and necessary in order to provide the required intensity of service to ensure the patient's safety. The patient's presenting symptoms, physical exam findings, and initial radiographic and laboratory data in the context of their medical condition is felt to place them at decreased risk for further clinical deterioration. Furthermore, it is anticipated that the patient will be medically stable for discharge from the hospital within 2 midnights of admission. The following factors support the patient status of  observation.   " The patient's presenting symptoms include palpitation, noted to have low blood pressure and high heart rate at dialysis. " The physical exam findings include tachycardia, irregular rhythm. " The initial radiographic and laboratory data are .  Lab work-up showed BMP with potassium 3.4, BUN 24, creatinine stable at 3.31, chloride 95, glucose 197.  CBC showed hemoglobin stable at 9.5.  PTT, PT, INR within normal limits.  Troponin mildly elevated at 36 and 45 respectively.  Rest were panel flu COVID-negative chest x-ray showed mild opacity at the right lung base unable to exclude developing pneumonia.   Marcelyn Bruins MD [facility]  How to contact the [facility] Attending or Consulting provider [place] or covering provider during after hours [place], for this patient?   Check the care team in [facility] and look for a) attending/consulting [facility] provider listed and b) the [facility] team listed Log into [email or web address] and use [facility] universal password to access. If you do not have the password, please contact the hospital operator. Locate the [facility] provider you are looking for under [facility] and page to a number that you can be directly reached. If you still have difficulty reaching the provider, please page the [facility] (Director on Call) for the Hospitalists listed on amion for assistance.  04/21/2021, 9:19 PM

## 2021-04-21 NOTE — ED Notes (Signed)
Pt transferred to hospital bed for comfort.

## 2021-04-21 NOTE — ED Provider Notes (Signed)
Emergency Medicine Provider Triage Evaluation Note  Analycia Mata , a 65 y.o. female  was evaluated in triage.  Pt had in from dialysis after receiving 2 out of the 4-hour treatment.  Reports waking up this morning with "a funny feeling of unwellness ", states she went to her treatment, on arrival at dialysis pressures were noted to be 80 systolic.  Reports feeling has somewhat improved.  When EKG was done by EMS she was found to be in A. fib with a rate in the 140s.  Prior history of A. fib noted, currently on no anticoagulation medication.  Review of Systems  Positive: palpitations Negative: Chest pain, shortness of breath, cough, fever  Physical Exam  There were no vitals taken for this visit. Gen:   Awake, no distress   Resp:  Normal effort  MSK:   Moves extremities without difficulty  Other:  No leg swelling, not ill-appearing, moves all upper and lower extremities.  Medical Decision Making  Medically screening exam initiated at 10:03 AM.  Appropriate orders placed.  Patricia Mata was informed that the remainder of the evaluation will be completed by another provider, this initial triage assessment does not replace that evaluation, and the importance of remaining in the ED until their evaluation is complete.  Vitals noted to be stable, heart rate in the 140s, EKG along with lab work has been ordered.   [name], PA-C 04/21/21 1009    Carmin Muskrat, MD 04/21/21 [id number]

## 2021-04-21 NOTE — ED Provider Notes (Signed)
[facility] EMERGENCY DEPARTMENT Provider Note   CSN: [id number] Arrival date & time: 04/21/21  2993     History Chief Complaint  Patient presents with   Atrial Fibrillation    Patricia Mata is a 65 y.o. female.  Patient with history of CKD, htn, CHF presents today from dialysis with atrial fibrillation. Patient was able to complete 2 of 4 hours of hemodialysis this morning. She reports feeling somewhat generally unwell this morning with other discernable symptoms, no palpitations, dizziness, lightheadedness. Patient is not anticoagulated.  Patient's son reports that she has had 1 isolated incident of paroxysmal A. fib when she was septic and intubated several years ago, however has not been on long-term anticoagulation for this event and has not had any other episodes of A. fib.  Denies fevers, chills, cough, congestion, chest pain, shortness of breath. Sees cardiology regularly for management of heart failure.  The history is provided by the patient. No language interpreter was used.  Atrial Fibrillation Pertinent negatives include no chest pain, no abdominal pain, no headaches and no shortness of breath.      Past Medical History:  Diagnosis Date   Acute renal failure (ARF) ([place]) 06/08/2018   TTHS- [place]   Carotid artery disease [facility])    Chronic diastolic (congestive) heart failure (HCC)    Colon cancer ([place]) 2008   Diabetes mellitus ([place])    Dyspnea    Gout 09/08/2018   Heart attack ([place])    Heart murmur    Hypertension    Hypothyroidism    Macrocytic anemia    MDS (myelodysplastic syndrome) (HCC)    Moderate mitral stenosis    PAD (peripheral artery disease) (HCC)    PAF (paroxysmal atrial fibrillation) (HCC)    Pulmonary hypertension (HCC)    Renal artery stenosis (HCC)    a. >60% R renal artery stenosis by duplex 2021, managed medically.   Severe aortic stenosis    Stage 4 chronic kidney disease (HCC)    Transfusion-dependent  anemia     Patient Active Problem List   Diagnosis Date Noted   Anemia 01/13/2021   Obesity 11/23/2020   Joint pain 11/23/2020   Other long term (current) drug therapy 11/23/2020   Primary osteoarthritis 11/23/2020   ESRD (end stage renal disease) ([place]) 11/22/2020   Nonrheumatic mitral (valve) stenosis 10/17/2020   Coagulation defect, unspecified ([place]) 71/69/6789   Complication of vascular dialysis catheter 10/14/2020   Peripheral vascular disease ([place]) 10/14/2020   NSTEMI (non-ST elevated myocardial infarction) ([place]) 10/10/2020   Angina pectoris ([place]) 10/09/2020   Angina at rest ([place]) 10/09/2020   Gastroesophageal reflux disease 09/12/2020   History of malignant neoplasm of colon 09/12/2020   Chronic gastritis 09/02/2020   Symptomatic anemia 08/31/2020   Abnormal uterine bleeding (AUB) 07/19/2020   Vaginal ulceration 07/19/2020   Acute renal failure superimposed on stage 4 chronic kidney disease ([place])    Palliative care by specialist    Acute on chronic diastolic CHF (congestive heart failure) ([place]) 06/07/2020   Chronic cough 05/04/2020   Allergic rhinitis 03/23/2020   Chronic kidney disease, stage 5 ([place]) 38/04/1750   Acute diastolic CHF (congestive heart failure) ([place]) 09/21/2019   HCAP (healthcare-associated pneumonia) 09/20/2019   Pulmonary hypertension ([place]) 09/20/2019   DM2 (diabetes mellitus, type 2) ([place]) 09/20/2019   Elevated troponin 09/20/2019   Severe sepsis ([place]) 09/20/2019   CAP (community acquired pneumonia) 09/14/2019   Epistaxis, recurrent 08/10/2019   Neutropenia ([place]) 05/25/2019   Malignant neoplasm of  colon ([place]) 05/25/2019   Bradycardia 03/21/2019   Pneumonia due to COVID-19 virus 03/18/2019   Type 2 diabetes mellitus with hemoglobin A1c goal of less than 7.0% ([place]) 12/10/2018   Hypothyroidism 12/10/2018   Seasonal allergies 12/10/2018   Gout 09/08/2018   Bacterial infection due to Morganella morganii    Sacral wound 06/08/2018   Iron deficiency  anemia due to chronic blood loss 06/08/2018   Hyponatremia 06/08/2018   Constipation 06/08/2018   Encounter for nasogastric (NG) tube placement    Pressure injury of skin 05/22/2018   PAF (paroxysmal atrial fibrillation) ([place]) 05/11/2018   Normocytic anemia 06/14/2017   Aortic stenosis, severe 06/10/2017   Carotid artery disease ([place]) 06/10/2017   Essential hypertension 05/13/2017   Hyperlipidemia 05/13/2017   Bilateral lower extremity edema 05/13/2017   Port-A-Cath in place 04/28/2017   MDS (myelodysplastic syndrome) ([place]) 03/20/2017   Goals of care, counseling/discussion 03/20/2017   Anemia in chronic kidney disease 05/10/2016   Anemia in stage 1 chronic kidney disease 05/10/2016   Anemia due to chronic kidney disease 02/09/2016    Past Surgical History:  Procedure Laterality Date   AV FISTULA PLACEMENT Right 09/26/2020   Procedure: RIGHT ARM ARTERIOVENOUS (AV) FISTULA CREATION;  Surgeon: Waynetta Sandy, MD;  Location: [place];  Service: Vascular;  Laterality: Right;  Monitor Anesthesia Care with Regional Block to Right Arm    [place] Right 12/13/2020   Procedure: RIGHT UPPER EXTREMITY ARTERIOVENOUS FISTULA TRANSPOSITION;  Surgeon: Waynetta Sandy, MD;  Location: [place];  Service: Vascular;  Laterality: Right;   COLON SURGERY     COLONOSCOPY WITH PROPOFOL N/A 02/22/2021   Procedure: COLONOSCOPY WITH PROPOFOL;  Surgeon: Otis Brace, MD;  [facility];  Service: Gastroenterology;  Laterality: N/A;   DEBRIDMENT OF DECUBITUS ULCER N/A 06/12/2018   Procedure: DEBRIDMENT OF DECUBITUS ULCER;  Surgeon: Wallace Going, DO;  Location: [facility];  Service: Plastics;  Laterality: N/A;   ENTEROSCOPY N/A 02/22/2021   Procedure: ENTEROSCOPY;  Surgeon: Otis Brace, MD;  [facility];  Service: Gastroenterology;  Laterality: N/A;   ESOPHAGOGASTRODUODENOSCOPY N/A 09/01/2020   Procedure: ESOPHAGOGASTRODUODENOSCOPY (EGD);  Surgeon:  Clarene Essex, MD;  [facility];  Service: Endoscopy;  Laterality: N/A;   GIVENS CAPSULE STUDY N/A 09/01/2020   Procedure: GIVENS CAPSULE STUDY;  Surgeon: Clarene Essex, MD;  [facility];  Service: Endoscopy;  Laterality: N/A;   HEMOSTASIS CLIP PLACEMENT  02/22/2021   Procedure: HEMOSTASIS CLIP PLACEMENT;  Surgeon: Otis Brace, MD;  Location: [place];  Service: Gastroenterology;;   HOT HEMOSTASIS N/A 02/22/2021   Procedure: HOT HEMOSTASIS (ARGON PLASMA COAGULATION/BICAP);  Surgeon: Otis Brace, MD;  Location: [facility] ENDOSCOPY;  Service: Gastroenterology;  Laterality: N/A;   IR FLUORO GUIDE CV LINE RIGHT  10/11/2020   IR FLUORO GUIDE PORT INSERTION RIGHT  04/02/2017   IR REMOVAL TUN ACCESS W/ PORT W/O FL MOD SED  03/16/2019   IR US GUIDE VASC ACCESS RIGHT  04/02/2017   IR US GUIDE VASC ACCESS RIGHT  10/11/2020   LIGATION OF COMPETING BRANCHES OF ARTERIOVENOUS FISTULA Right 12/13/2020   Procedure: LIGATION OF COMPETING BRANCHES OF RIGHT UPPER EXTREMITY ARTERIOVENOUS FISTULA;  Surgeon: Waynetta Sandy, MD;  Location: [place];  Service: Vascular;  Laterality: Right;   POLYPECTOMY  02/22/2021   Procedure: POLYPECTOMY;  Surgeon: Otis Brace, MD;  Location: [place];  Service: Gastroenterology;;   RIGHT HEART CATH N/A 04/23/2019   Procedure: RIGHT HEART CATH;  Surgeon: Jolaine Artist, MD;  Location: [place] CV LAB;  Service: Cardiovascular;  Laterality: N/A;   RIGHT/LEFT HEART CATH AND CORONARY ANGIOGRAPHY N/A 05/21/2018   Procedure: RIGHT/LEFT HEART CATH AND CORONARY ANGIOGRAPHY;  Surgeon: Nelva Bush, MD;  Location: [place] [facility];  Service: Cardiovascular;  Laterality: N/A;   RIGHT/LEFT HEART CATH AND CORONARY ANGIOGRAPHY N/A 10/13/2020   Procedure: RIGHT/LEFT HEART CATH AND CORONARY ANGIOGRAPHY;  Surgeon: Jolaine Artist, MD;  Location: [place] CV [facility];  Service: Cardiovascular;  Laterality: N/A;   TUBAL LIGATION       OB History   No  obstetric history on file.     Family History  Problem Relation Age of Onset   Hypertension Mother    Diabetes Mother    Cervical cancer Mother    Heart attack Father    Hypertension Sister    Hypertension Brother    Hypertension Sister    Hypertension Sister    Prostate cancer Brother    HIV/AIDS Brother     Social History   Tobacco Use   Smoking status: Former    Packs/day: 0.25    Years: 20.00    Pack years: 5.00    Types: Cigarettes    Quit date: 1997    Years since quitting: 25.7   Smokeless tobacco: Former  Scientific laboratory technician Use: Never used  Substance Use Topics   Alcohol use: Yes    Alcohol/week: 1.0 standard drink    Types: 1 Standard drinks or equivalent per week    Comment: occ   Drug use: Never    Home Medications Prior to Admission medications   Medication Sig Start Date End Date Taking? Authorizing Provider  allopurinol (ZYLOPRIM) 300 MG tablet Take 300 mg by mouth daily. 07/05/20   [provider]  amiodarone (PACERONE) 200 MG tablet Take 1 tablet (200 mg total) by mouth daily. 07/19/20   Bensimhon, Shaune Pascal, MD  atorvastatin (LIPITOR) 40 MG tablet Take 1 tablet by mouth once daily 11/13/20   [name], FNP  blood glucose meter kit and supplies Dispense based on patient and insurance preference. Use up to four times daily as directed. (FOR ICD-10 E10.9, E11.9). 11/04/18   [name], FNP  Blood Glucose Monitoring Suppl [facility] Karsten Fells) w/Device KIT Check blood sugars twice daily E11.9 11/30/19   [name], FNP  carvedilol (COREG) 3.125 MG tablet Take 1 tablet by mouth twice daily 04/16/21   Clegg, Amy D, NP  cholecalciferol (VITAMIN D3) 25 MCG (1000 UNIT) tablet Take 1,000 Units by mouth daily.    [provider]  cloNIDine (CATAPRES) 0.1 MG tablet Take 1 tablet (0.1 mg total) by mouth 2 (two) times daily. 07/03/20   Bensimhon, Shaune Pascal, MD  glucose blood test strip check blood sugars twice daily Dx code E11.22 03/02/20    [name], FNP  hydrALAZINE (APRESOLINE) 100 MG tablet Take 1 tablet (100 mg total) by mouth 3 (three) times daily. 09/27/20   Lorretta Harp, MD  Insulin Glargine [facility]) 100 UNIT/ML Inject 15 Units into the skin daily as needed (only if blood sugar over 150). Only if above 150    [provider]  Insulin Pen Needle 29G X 5MM MISC Use as directed 06/16/17   Bonnielee Haff, MD  Lancets (ONETOUCH DELICA PLUS [id number]) [place] check blood sugars twice daily Dx code: E11.22 03/02/20   [name], FNP  latanoprost (XALATAN) 0.005 % ophthalmic solution Place 1 drop into both eyes at bedtime.  [provider]  levothyroxine (SYNTHROID) 150 MCG tablet TAKE 1 TABLET BY MOUTH ONCE DAILY BEFORE BREAKFAST 04/16/21   [name], FNP  lidocaine-prilocaine (EMLA) cream Apply 1 application topically daily as needed (port access). 01/23/21   [provider]  linagliptin (TRADJENTA) 5 MG TABS tablet Take 1 tablet (5 mg total) by mouth daily. 04/05/21   [name], FNP  Multiple Vitamins-Minerals (CENTRUM SILVER 50+WOMEN) TABS Take 1 tablet by mouth daily.    [provider]  omeprazole (PRILOSEC) 40 MG capsule Take 1 capsule (40 mg total) by mouth 2 (two) times daily. 04/16/21   [name], FNP  polyethylene glycol (MIRALAX / GLYCOLAX) 17 g packet Take 17 g by mouth daily as needed for moderate constipation. 01/14/21   Geradine Girt, DO  sildenafil (REVATIO) 20 MG tablet TAKE 1 TABLET BY MOUTH THREE TIMES DAILY 02/05/21   Sherren Mocha, MD    Allergies    Ancef [cefazolin], Lactose intolerance (gi), Morphine and related, and Sulfa antibiotics  Review of Systems   Review of Systems  Constitutional:  Negative for chills, diaphoresis, fatigue and fever.  HENT:  Negative for congestion, postnasal drip, rhinorrhea and sore throat.   Respiratory:  Negative for cough, chest tightness, shortness of breath, wheezing and stridor.   Cardiovascular:  Negative  for chest pain, palpitations and leg swelling.  Gastrointestinal:  Negative for abdominal distention, abdominal pain, diarrhea, nausea and vomiting.  Neurological:  Negative for dizziness, tremors, seizures, syncope, facial asymmetry, speech difficulty, weakness, light-headedness, numbness and headaches.  Psychiatric/Behavioral:  Negative for confusion and decreased concentration.   All other systems reviewed and are negative.  Physical Exam Updated Vital Signs BP (!) 152/66 (BP Location: Left Arm)   Pulse (!) 145   Temp 98.4 F (36.9 C) (Oral)   Resp 18   SpO2 100%   Physical Exam Vitals and nursing note reviewed.  Constitutional:      General: She is not in acute distress.    Appearance: Normal appearance. She is normal weight. She is not ill-appearing, toxic-appearing or diaphoretic.  HENT:     Head: Normocephalic and atraumatic.  Cardiovascular:     Rate and Rhythm: Tachycardia present. Rhythm irregular.     Comments: Systolic ejection murmur, patient has known aortic stenosis Pulmonary:     Effort: Pulmonary effort is normal. No respiratory distress.     Comments: Rales noted at right lung base, clear in all other fields Abdominal:     General: Abdomen is flat. Bowel sounds are normal.     Palpations: Abdomen is soft.  Musculoskeletal:        General: Normal range of motion.     Cervical back: Normal range of motion.  Skin:    General: Skin is warm and dry.  Neurological:     General: No focal deficit present.     Mental Status: She is alert.  Psychiatric:        Mood and Affect: Mood normal.        Behavior: Behavior normal.    ED Results / Procedures / Treatments   Labs (all labs ordered are listed, but only abnormal results are displayed) Labs Reviewed  BASIC METABOLIC PANEL - Abnormal; Notable for the following components:      Result Value   Potassium 3.4 (*)    Chloride 95 (*)    Glucose, Bld 197 (*)    BUN 24 (*)    Creatinine, Ser 3.31 (*)    GFR,  Estimated  15 (*)    All other components within normal limits  CBC - Abnormal; Notable for the following components:   RBC 3.17 (*)    Hemoglobin 9.5 (*)    HCT 28.6 (*)    RDW 15.9 (*)    All other components within normal limits  TROPONIN I (HIGH SENSITIVITY) - Abnormal; Notable for the following components:   Troponin I (High Sensitivity) 36 (*)    All other components within normal limits  TROPONIN I (HIGH SENSITIVITY) - Abnormal; Notable for the following components:   Troponin I (High Sensitivity) 45 (*)    All other components within normal limits    EKG EKG Interpretation  Date/Time:  Saturday April 21 2021 10:08:08 EDT Ventricular Rate:  137 PR Interval:    QRS Duration: 86 QT Interval:  330 QTC Calculation: 498 R Axis:   53 Text Interpretation: Atrial fibrillation with rapid ventricular response ST & T wave abnormality, consider inferolateral ischemia Abnormal ECG Since last tracing rate faster and now in Atrial fibrillation Otherwise no significant change Confirmed by Daleen Bo [phone]) on 04/21/2021 1:00:07 PM  Radiology DG Chest 2 View  Result Date: 04/21/2021 CLINICAL DATA:  Atrial fibrillation and diabetes. EXAM: CHEST - 2 VIEW COMPARISON:  December 26, 2020 FINDINGS: The heart size and mediastinal contours are stable. Mild opacity of the medial right lung base noted. There is no pulmonary edema or pleural effusion. The visualized skeletal structures are unremarkable. IMPRESSION: Mild opacity of the medial right lung base, developing pneumonia is not excluded. Electronically Signed   By: Abelardo Diesel M.D.   On: 04/21/2021 10:41    Procedures .Critical Care Performed by: Bud Face, PA-C Authorized by: Bud Face, PA-C   Critical care provider statement:    Critical care time (minutes):  45   Critical care start time:  04/21/2021 2:41 PM   Critical care was necessary to treat or prevent imminent or life-threatening deterioration of the following  conditions:  Circulatory failure   Critical care was time spent personally by me on the following activities:  Development of treatment plan with patient or surrogate, evaluation of patient's response to treatment, examination of patient, obtaining history from patient or surrogate, ordering and performing treatments and interventions, ordering and review of laboratory studies, ordering and review of radiographic studies, pulse oximetry, re-evaluation of patient's condition and review of old charts   Medications Ordered in ED Medications  diltiazem (CARDIZEM) injection 20 mg (20 mg Intravenous Given 04/21/21 1450)    ED Course  I have reviewed the triage vital signs and the nursing notes.  Pertinent labs & imaging results that were available during my care of the patient were reviewed by me and considered in my medical decision making (see chart for details).  Clinical Course as of 04/21/21 2125  Sat Apr 21, 2021  1535 Afib RVR Start dilt drip, heparin Consult cardiology for admit, finish dialysis(?) [CP]    Clinical Course User Index [CP] Prosperi, Joesph Fillers, PA-C   MDM Rules/Calculators/A&P                         Patient presents today with atrial fibrillation RVR with rates in the 140s. History of 1 prior episode in the past when she was admitted for sepsis. She is not anticoagulated. Unsure of how long patient has been in atrial fibrillation.  Bolus of diltizem given with good response, plan to start drip for continued rate management. Patient is  hemodynamically stable, cardioversion is not indicated at this time.  Additionally, patient found to have consolidation on chest x-ray, will initiate CAP prophylaxis with Azithromycin and Rocephin.  Plan to start heparin for stroke prophylaxis in the setting of new onset atrial fibrillation and consult cardiology for admission.    Care hand-off to [facility], PA-C at shift change.   Final Clinical Impression(s) / ED  Diagnoses Final diagnoses:  None    Rx / DC Orders ED Discharge Orders     None        Nestor Lewandowsky 04/21/21 2128    Regan Lemming, MD 04/22/21 1858

## 2021-04-21 NOTE — Progress Notes (Signed)
ANTICOAGULATION CONSULT NOTE - Initial Consult  Pharmacy Consult for Heparin Indication: atrial fibrillation  Allergies  Allergen Reactions   Lactose Intolerance (Gi) Diarrhea   Morphine And Related Itching   Sulfa Antibiotics Rash and Other (See Comments)    Blisters, also    Patient Measurements:   Heparin Dosing Weight: 74.6 kg  Vital Signs: Temp: 98.4 F (36.9 C) (10/08 1255) Temp Source: Oral (10/08 1255) BP: 130/58 (10/08 1500) Pulse Rate: 116 (10/08 1500)  Labs: Recent Labs    04/21/21 1017 04/21/21 1211  HGB 9.5*  --   HCT 28.6*  --   PLT 187  --   CREATININE 3.31*  --   TROPONINIHS 36* 45*    Estimated Creatinine Clearance: 18.1 mL/min (A) (by C-G formula based on SCr of 3.31 mg/dL (H)).   Medical History: Past Medical History:  Diagnosis Date   Acute renal failure (ARF) ([place]) 06/08/2018   TTHS- [address]   Carotid artery disease ([place])    Chronic diastolic (congestive) heart failure (HCC)    Colon cancer ([place]) 2008   Diabetes mellitus ([place])    Dyspnea    Gout 09/08/2018   Heart attack ([place])    Heart murmur    Hypertension    Hypothyroidism    Macrocytic anemia    MDS (myelodysplastic syndrome) (HCC)    Moderate mitral stenosis    PAD (peripheral artery disease) (HCC)    PAF (paroxysmal atrial fibrillation) (HCC)    Pulmonary hypertension (HCC)    Renal artery stenosis (HCC)    a. >60% R renal artery stenosis by duplex 2021, managed medically.   Severe aortic stenosis    Stage 4 chronic kidney disease (HCC)    Transfusion-dependent anemia     Medications:  (Not in a hospital admission)  Scheduled:  Infusions:   diltiazem (CARDIZEM) infusion     PRN:   Assessment: 69 yof presenting with new onset AF. Heparin per pharmacy consult placed for atrial fibrillation.  Patient is on not on anticoagulation prior to arrival.  Hgb9.5;plt 187 - near baseline  Goal of Therapy:  Heparin level 0.3-0.7 units/ml Monitor platelets by  anticoagulation protocol: Yes   Plan:  No bolus Start heparin infusion at 1200 units/hr Check anti-Xa level in 8 hours and daily while on heparin Continue to monitor H&H and platelets  Lorelei Pont, PharmD, BCPS 04/21/2021 3:40 PM ED Clinical Pharmacist -  [phone]

## 2021-04-21 NOTE — ED Provider Notes (Addendum)
Accepted handoff at shift change from Patricia Rua PA-C. Please see prior provider note for more detail.   Briefly: Patient is 65 y.o.   DDX: concern for new onset A. fib with RVR, incomplete dialysis only had 2 hours.  Patient reports that she woke up feeling generally unwell, with some fullness in her abdomen.  Patient denies chest pain, shortness of breath.  No focal neurodeficits noted.  Patient is not currently taking a blood thinner.  Patient son reports that she has had 1 isolated incident of paroxysmal A. fib when she was septic and intubated several years ago, however has not been on long-term anticoagulation for this event and has not had any other episodes of A. fib.  Plan: Consult cardiology for management given new A. fib with RVR.  Questionable developing pneumonia, will cover for community-acquired pneumonia with Rocephin and Zithromax.  Patient responded to Cardizem bolus, will begin drip we will begin heparin.  Patient with continued rate control down to low 110s with diltiazem, continues to be asymptomatic other than patient aware that she is having a fast irregular heart rate.  Consulted with cardiology who agreed to see patient and give recommendations, recommend admission to hospital service for A. fib with RVR, early pneumonia.  Consult placed to [facility] hospitalist at this time. Hospitalist agrees to admit at this time.   Consult call-back from cardiology: D/C Heparin. Reload Amio. Not a candidate for anticoagulation based on chronic need for blood transfusion with dialysis.    Anselmo Pickler, PA-C 04/21/21 2004    Dorien Chihuahua 04/21/21 2006    Valarie Merino, MD 04/23/21 2328

## 2021-04-21 NOTE — ED Notes (Signed)
Dinner tray ordered.

## 2021-04-22 ENCOUNTER — Encounter (HOSPITAL_COMMUNITY): Payer: Self-pay | Admitting: Internal Medicine

## 2021-04-22 DIAGNOSIS — N186 End stage renal disease: Secondary | ICD-10-CM

## 2021-04-22 DIAGNOSIS — D469 Myelodysplastic syndrome, unspecified: Secondary | ICD-10-CM | POA: Diagnosis not present

## 2021-04-22 DIAGNOSIS — I4891 Unspecified atrial fibrillation: Secondary | ICD-10-CM | POA: Diagnosis not present

## 2021-04-22 DIAGNOSIS — I10 Essential (primary) hypertension: Secondary | ICD-10-CM

## 2021-04-22 DIAGNOSIS — I480 Paroxysmal atrial fibrillation: Secondary | ICD-10-CM

## 2021-04-22 LAB — CBC
HCT: 26.4 % — ABNORMAL LOW (ref 36.0–46.0)
HCT: 26.4 % — ABNORMAL LOW (ref 36.0–46.0)
Hemoglobin: 8.7 g/dL — ABNORMAL LOW (ref 12.0–15.0)
Hemoglobin: 8.7 g/dL — ABNORMAL LOW (ref 12.0–15.0)
MCH: 30.4 pg (ref 26.0–34.0)
MCH: 30.5 pg (ref 26.0–34.0)
MCHC: 33 g/dL (ref 30.0–36.0)
MCHC: 33 g/dL (ref 30.0–36.0)
MCV: 92.3 fL (ref 80.0–100.0)
MCV: 92.6 fL (ref 80.0–100.0)
Platelets: 173 10*3/uL (ref 150–400)
Platelets: 141 10*3/uL — ABNORMAL LOW (ref 150–400)
RBC: 2.86 MIL/uL — ABNORMAL LOW (ref 3.87–5.11)
RBC: 2.85 MIL/uL — ABNORMAL LOW (ref 3.87–5.11)
RDW: 15.9 % — ABNORMAL HIGH (ref 11.5–15.5)
RDW: 15.9 % — ABNORMAL HIGH (ref 11.5–15.5)
WBC: 4.3 10*3/uL (ref 4.0–10.5)
WBC: 4.2 10*3/uL (ref 4.0–10.5)
nRBC: 0 % (ref 0.0–0.2)
nRBC: 0.5 % — ABNORMAL HIGH (ref 0.0–0.2)

## 2021-04-22 LAB — BASIC METABOLIC PANEL
Anion gap: 11 (ref 5–15)
BUN: 42 mg/dL — ABNORMAL HIGH (ref 8–23)
CO2: 32 mmol/L (ref 22–32)
Calcium: 9.5 mg/dL (ref 8.9–10.3)
Chloride: 92 mmol/L — ABNORMAL LOW (ref 98–111)
Creatinine, Ser: 4.64 mg/dL — ABNORMAL HIGH (ref 0.44–1.00)
GFR, Estimated: 10 mL/min — ABNORMAL LOW (ref >60)
Glucose, Bld: 161 mg/dL — ABNORMAL HIGH (ref 70–99)
Potassium: 3.5 mmol/L (ref 3.5–5.1)
Sodium: 135 mmol/L (ref 135–145)

## 2021-04-22 LAB — RENAL FUNCTION PANEL
Albumin: 3 g/dL — ABNORMAL LOW (ref 3.5–5.0)
Anion gap: 13 (ref 5–15)
BUN: 45 mg/dL — ABNORMAL HIGH (ref 8–23)
CO2: 29 mmol/L (ref 22–32)
Calcium: 9.8 mg/dL (ref 8.9–10.3)
Chloride: 93 mmol/L — ABNORMAL LOW (ref 98–111)
Creatinine, Ser: 4.81 mg/dL — ABNORMAL HIGH (ref 0.44–1.00)
GFR, Estimated: 9 mL/min — ABNORMAL LOW (ref >60)
Glucose, Bld: 105 mg/dL — ABNORMAL HIGH (ref 70–99)
Phosphorus: 4.6 mg/dL (ref 2.5–4.6)
Potassium: 3.8 mmol/L (ref 3.5–5.1)
Sodium: 135 mmol/L (ref 135–145)

## 2021-04-22 LAB — CBG MONITORING, ED: Glucose-Capillary: 111 mg/dL — ABNORMAL HIGH (ref 70–99)

## 2021-04-22 LAB — GLUCOSE, CAPILLARY
Glucose-Capillary: 103 mg/dL — ABNORMAL HIGH (ref 70–99)
Glucose-Capillary: 163 mg/dL — ABNORMAL HIGH (ref 70–99)
Glucose-Capillary: 155 mg/dL — ABNORMAL HIGH (ref 70–99)

## 2021-04-22 LAB — MAGNESIUM: Magnesium: 2 mg/dL (ref 1.7–2.4)

## 2021-04-22 MED ORDER — SODIUM CHLORIDE 0.9% FLUSH: 10.0000 mL | INTRAVENOUS | Status: DC | PRN

## 2021-04-22 MED ORDER — DIPHENHYDRAMINE HCL 50 MG/ML IJ SOLN: 25.0000 mg | Freq: Once | INTRAMUSCULAR | Status: DC

## 2021-04-22 MED ORDER — SODIUM CHLORIDE 0.9% FLUSH: 3.0000 mL | INTRAVENOUS | Status: DC | PRN

## 2021-04-22 MED ORDER — HEPARIN SODIUM (PORCINE) 1000 UNIT/ML DIALYSIS: 1000.0000 [IU] | INTRAMUSCULAR | Status: DC | PRN

## 2021-04-22 MED ORDER — ACETAMINOPHEN 10 MG/ML IV SOLN
1000.0000 mg | Freq: Once | INTRAVENOUS | Status: DC
  Filled 2021-04-22: qty 100

## 2021-04-22 MED ORDER — HEPARIN SOD (PORK) LOCK FLUSH 100 UNIT/ML IV SOLN
250.0000 [IU] | INTRAVENOUS | Status: DC | PRN
  Filled 2021-04-22: qty 2.5

## 2021-04-22 MED ORDER — HYDRALAZINE HCL 50 MG PO TABS
100.0000 mg | ORAL_TABLET | Freq: Once | ORAL | Status: AC
  Administered 2021-04-23: 100 mg via ORAL
  Filled 2021-04-22: qty 2

## 2021-04-22 MED ORDER — CHLORHEXIDINE GLUCONATE CLOTH 2 % EX PADS
6.0000 | MEDICATED_PAD | Freq: Every day | CUTANEOUS | Status: DC
  Administered 2021-04-22: 6 via TOPICAL

## 2021-04-22 MED ORDER — HEPARIN SOD (PORK) LOCK FLUSH 100 UNIT/ML IV SOLN
500.0000 [IU] | Freq: Every day | INTRAVENOUS | Status: DC | PRN
  Filled 2021-04-22: qty 5

## 2021-04-22 MED ORDER — SODIUM CHLORIDE 0.9 % IV SOLN: 100.0000 mL | INTRAVENOUS | Status: DC | PRN

## 2021-04-22 MED ORDER — CARVEDILOL 6.25 MG PO TABS
6.2500 mg | ORAL_TABLET | Freq: Two times a day (BID) | ORAL | Status: DC
  Administered 2021-04-22 – 2021-04-23 (×2): 6.25 mg via ORAL
  Filled 2021-04-22 (×2): qty 1

## 2021-04-22 MED ORDER — PENTAFLUOROPROP-TETRAFLUOROETH EX AERO: 1.0000 "application " | INHALATION_SPRAY | CUTANEOUS | Status: DC | PRN

## 2021-04-22 MED ORDER — LIDOCAINE HCL (PF) 1 % IJ SOLN: 5.0000 mL | INTRAMUSCULAR | Status: DC | PRN

## 2021-04-22 MED ORDER — SODIUM CHLORIDE 0.9% IV SOLUTION: 250.0000 mL | Freq: Once | INTRAVENOUS | Status: DC

## 2021-04-22 MED ORDER — ALTEPLASE 2 MG IJ SOLR: 2.0000 mg | Freq: Once | INTRAMUSCULAR | Status: DC | PRN

## 2021-04-22 MED ORDER — HEPARIN SODIUM (PORCINE) 5000 UNIT/ML IJ SOLN
5000.0000 [IU] | Freq: Three times a day (TID) | INTRAMUSCULAR | Status: DC
  Administered 2021-04-22 – 2021-04-23 (×3): 5000 [IU] via SUBCUTANEOUS
  Filled 2021-04-22 (×3): qty 1

## 2021-04-22 MED ORDER — CALCITRIOL 0.25 MCG PO CAPS: 0.2500 ug | ORAL_CAPSULE | ORAL | Status: DC

## 2021-04-22 MED ORDER — LIDOCAINE-PRILOCAINE 2.5-2.5 % EX CREA: 1.0000 "application " | TOPICAL_CREAM | CUTANEOUS | Status: DC | PRN

## 2021-04-22 NOTE — Evaluation (Signed)
Physical Therapy Evaluation Patient Details Name: Patricia Mata MRN: [id number] DOB: 05-30-1956 Today's Date: 04/22/2021  History of Present Illness  The pt is a 65 yo female presenting 10/8 due to hypotention and tachycardia due to new onset afib. Pt poor candidate for anticoagulation, converted to sinus rhythm with IV amiodarone. PMH includes: CAD, CHF, DM II, HTN, PAD, pulm HTN, CKD IV, and anemia.   Clinical Impression  Pt in bed upon arrival of PT, agreeable to evaluation at this time. Prior to admission the pt was completely independent without need for AD with mobility in the home, independent with ADLs and IADLs, but does report use of WC for longer distance mobility due to chronic conditions. She was able to demo good independence with bed mobility and sit-stand transfers at this time, and completed 200 ft hallway ambulation without need for UE support or any assist, even with balance challenge. The pt had no change in vitals with activity, and likely has no further needs for acute PT or any skilled PT follow up. Will check back once more to further challenge intensity with mobility, but suspect pt safe to return home with family support once medically cleared.         Recommendations for follow up therapy are one component of a multi-disciplinary discharge planning process, led by the attending physician.  Recommendations may be updated based on patient status, additional functional criteria and insurance authorization.  Follow Up Recommendations No PT follow up    Equipment Recommendations  None recommended by PT    Recommendations for Other Services       Precautions / Restrictions Precautions Precautions: None Restrictions Weight Bearing Restrictions: No      Mobility  Bed Mobility Overal bed mobility: Independent                  Transfers Overall transfer level: Independent Equipment used: None                Ambulation/Gait Ambulation/Gait  assistance: Supervision Gait Distance (Feet): 200 Feet Assistive device: None Gait Pattern/deviations: WFL(Within Functional Limits) Gait velocity: 0.8 m/s Gait velocity interpretation: 1.31 - 2.62 ft/sec, indicative of limited community ambulator General Gait Details: pt with good stability, no use of AD. reports this is similar to distance she walks at home safely. good speed. no change in VS with mobility    Balance Overall balance assessment: Mild deficits observed, not formally tested                                           Pertinent Vitals/Pain Pain Assessment: No/denies pain    Home Living Family/patient expects to be discharged to:: Private residence Living Arrangements: Spouse/significant other Available Help at Discharge: Family;Available 24 hours/day Type of Home: House Home Access: Stairs to enter Entrance Stairs-Rails: Left;Right;Can reach both Technical brewer of Steps: 3 Home Layout: One level Home Equipment: Grab bars - tub/shower Additional Comments: drives, not using AD prior to admission    Prior Function Level of Independence: Independent               Hand Dominance   Dominant Hand: Left    Extremity/Trunk Assessment   Upper Extremity Assessment Upper Extremity Assessment: Overall WFL for tasks assessed    Lower Extremity Assessment Lower Extremity Assessment: Overall WFL for tasks assessed    Cervical / Trunk Assessment Cervical / Trunk Assessment:  Normal  Communication   Communication: No difficulties  Cognition Arousal/Alertness: Awake/alert Behavior During Therapy: WFL for tasks assessed/performed Overall Cognitive Status: Within Functional Limits for tasks assessed                                        General Comments General comments (skin integrity, edema, etc.): VSS on RA upon arrival, with mobility, and following session        Assessment/Plan    PT Assessment Patient needs  continued PT services  PT Problem List Decreased activity tolerance;Decreased mobility       PT Treatment Interventions Gait training;Stair training;DME instruction;Functional mobility training;Therapeutic exercise;Therapeutic activities;Balance training;Patient/family education    PT Goals (Current goals can be found in the Care Plan section)  Acute Rehab PT Goals Patient Stated Goal: return home PT Goal Formulation: With patient Time For Goal Achievement: 04/24/21 Potential to Achieve Goals: Good    Frequency Other (Comment) (1 follow up)    AM-PAC PT "6 Clicks" Mobility  Outcome Measure Help needed turning from your back to your side while in a flat bed without using bedrails?: None Help needed moving from lying on your back to sitting on the side of a flat bed without using bedrails?: None Help needed moving to and from a bed to a chair (including a wheelchair)?: None Help needed standing up from a chair using your arms (e.g., wheelchair or bedside chair)?: None Help needed to walk in hospital room?: A Little Help needed climbing 3-5 steps with a railing? : A Little 6 Click Score: 22    End of Session Equipment Utilized During Treatment: Gait belt Activity Tolerance: Patient tolerated treatment well Patient left: in bed;with call bell/phone within reach;with family/visitor present (sitting EOB with family) Nurse Communication: Mobility status PT Visit Diagnosis: Other abnormalities of gait and mobility (R26.89)    Time: 4496-7591 PT Time Calculation (min) (ACUTE ONLY): 11 min   Charges:   PT Evaluation $PT Eval Low Complexity: 1 Low          [place] Carbo, PT, DPT   Acute Rehabilitation Department Pager #: [phone]  Sandra Cockayne 04/22/2021, 5:45 PM

## 2021-04-22 NOTE — ED Notes (Signed)
Lunch ordered 

## 2021-04-22 NOTE — Consult Note (Signed)
ELECTROPHYSIOLOGY CONSULT NOTE    Primary Care Physician: [name], FNP Referring Physician:  Dr Candiss Norse  Admit Date: 04/21/2021  Reason for consultation:  Afib  Patricia Mata is a 65 y.o. female with a h/o severe pulmonary HTN, severe AS, moderate MS, and ESRD who presents with symptomatic afib.  The patient has very complicated history as outlined in cardiology consult (Dr Domenic Moras consult note reviewed).      She presents with AF and RVR.  + palpitations.   She was admitted and started on IV amiodarone.  She has since converted to sinus rhythm.  Currently, she feels well.  Past Medical History:  Diagnosis Date   Acute renal failure (ARF) ([place]) 06/08/2018   TTHS- [place]   Carotid artery disease [facility])    Chronic diastolic (congestive) heart failure (HCC)    Colon cancer ([place]) 2008   Diabetes mellitus ([place])    Dyspnea    ESRD on hemodialysis ([place])    Gout 09/08/2018   Heart attack ([place])    Heart murmur    Hypertension    Hypothyroidism    Macrocytic anemia    MDS (myelodysplastic syndrome) (HCC)    Moderate mitral stenosis    PAD (peripheral artery disease) (HCC)    PAF (paroxysmal atrial fibrillation) (HCC)    Pulmonary hypertension (HCC)    Renal artery stenosis (HCC)    a. >60% R renal artery stenosis by duplex 2021, managed medically.   Severe aortic stenosis    Transfusion-dependent anemia    Past Surgical History:  Procedure Laterality Date   AV FISTULA PLACEMENT Right 09/26/2020   Procedure: RIGHT ARM ARTERIOVENOUS (AV) FISTULA CREATION;  Surgeon: Waynetta Sandy, MD;  Location: [place];  Service: Vascular;  Laterality: Right;  Monitor Anesthesia Care with Regional Block to Right Arm    [place] Right 12/13/2020   Procedure: RIGHT UPPER EXTREMITY ARTERIOVENOUS FISTULA TRANSPOSITION;  Surgeon: Waynetta Sandy, MD;  Location: [place];  Service: Vascular;  Laterality: Right;   COLON SURGERY     COLONOSCOPY WITH  PROPOFOL N/A 02/22/2021   Procedure: COLONOSCOPY WITH PROPOFOL;  Surgeon: Otis Brace, MD;  Location: [place];  Service: Gastroenterology;  Laterality: N/A;   DEBRIDMENT OF DECUBITUS ULCER N/A 06/12/2018   Procedure: DEBRIDMENT OF DECUBITUS ULCER;  Surgeon: Wallace Going, DO;  Location: [facility];  Service: Plastics;  Laterality: N/A;   ENTEROSCOPY N/A 02/22/2021   Procedure: ENTEROSCOPY;  Surgeon: Otis Brace, MD;  Location: [facility];  Service: Gastroenterology;  Laterality: N/A;   ESOPHAGOGASTRODUODENOSCOPY N/A 09/01/2020   Procedure: ESOPHAGOGASTRODUODENOSCOPY (EGD);  Surgeon: Clarene Essex, MD;  Location: [facility];  Service: Endoscopy;  Laterality: N/A;   GIVENS CAPSULE STUDY N/A 09/01/2020   Procedure: GIVENS CAPSULE STUDY;  Surgeon: Clarene Essex, MD;  Location: [facility];  Service: Endoscopy;  Laterality: N/A;   HEMOSTASIS CLIP PLACEMENT  02/22/2021   Procedure: HEMOSTASIS CLIP PLACEMENT;  Surgeon: Otis Brace, MD;  Location: [place];  Service: Gastroenterology;;   HOT HEMOSTASIS N/A 02/22/2021   Procedure: HOT HEMOSTASIS (ARGON PLASMA COAGULATION/BICAP);  Surgeon: Otis Brace, MD;  Location: [facility];  Service: Gastroenterology;  Laterality: N/A;   IR FLUORO GUIDE CV LINE RIGHT  10/11/2020   IR FLUORO GUIDE PORT INSERTION RIGHT  04/02/2017   IR REMOVAL TUN ACCESS W/ PORT W/O FL MOD SED  03/16/2019   IR US GUIDE VASC ACCESS RIGHT  04/02/2017   IR US GUIDE VASC ACCESS RIGHT  10/11/2020   LIGATION OF COMPETING  BRANCHES OF ARTERIOVENOUS FISTULA Right 12/13/2020   Procedure: LIGATION OF COMPETING BRANCHES OF RIGHT UPPER EXTREMITY ARTERIOVENOUS FISTULA;  Surgeon: Waynetta Sandy, MD;  Location: [place];  Service: Vascular;  Laterality: Right;   POLYPECTOMY  02/22/2021   Procedure: POLYPECTOMY;  Surgeon: Otis Brace, MD;  Location: [place] [facility];  Service: Gastroenterology;;   RIGHT HEART CATH N/A 04/23/2019   Procedure: RIGHT HEART CATH;   Surgeon: Jolaine Artist, MD;  Location: [place] [facility];  Service: Cardiovascular;  Laterality: N/A;   RIGHT/LEFT HEART CATH AND CORONARY ANGIOGRAPHY N/A 05/21/2018   Procedure: RIGHT/LEFT HEART CATH AND CORONARY ANGIOGRAPHY;  Surgeon: Nelva Bush, MD;  Location: [place] CV LAB;  Service: Cardiovascular;  Laterality: N/A;   RIGHT/LEFT HEART CATH AND CORONARY ANGIOGRAPHY N/A 10/13/2020   Procedure: RIGHT/LEFT HEART CATH AND CORONARY ANGIOGRAPHY;  Surgeon: Jolaine Artist, MD;  Location: [facility];  Service: Cardiovascular;  Laterality: N/A;   TUBAL LIGATION       atorvastatin  40 mg Oral Daily   carvedilol  6.25 mg Oral BID WC   heparin injection (subcutaneous)  5,000 Units Subcutaneous Q8H   insulin aspart  0-6 Units Subcutaneous TID WC   latanoprost  1 drop Both Eyes QHS   levothyroxine  150 mcg Oral QAC breakfast   sildenafil  20 mg Oral TID    amiodarone 30 mg/hr (04/22/21 0909)    Allergies  Allergen Reactions   Lactose Intolerance (Gi) Diarrhea   Morphine And Related Itching   Sulfa Antibiotics Rash and Other (See Comments)    Blisters, also    Social History   Socioeconomic History   Marital status: Married    Spouse name: Not on file   Number of children: Not on file   Years of education: Not on file   Highest education level: Not on file  Occupational History   Occupation: retired  Tobacco Use   Smoking status: Former    Packs/day: 0.25    Years: 20.00    Pack years: 5.00    Types: Cigarettes    Quit date: 1997    Years since quitting: 25.7   Smokeless tobacco: Former  Scientific laboratory technician Use: Never used  Substance and Sexual Activity   Alcohol use: Yes    Alcohol/week: 1.0 standard drink    Types: 1 Standard drinks or equivalent per week    Comment: occ   Drug use: Never   Sexual activity: Not on file  Other Topics Concern   Not on file  Social History Narrative   Not on file   Social Determinants of Health   Financial  Resource Strain: Low Risk    Difficulty of Paying Living Expenses: Not hard at all  Food Insecurity: No Food Insecurity   Worried About Charity fundraiser in the Last Year: Never true   [place] in the Last Year: Never true  Transportation Needs: No Transportation Needs   Lack of Transportation (Medical): No   Lack of Transportation (Non-Medical): No  Physical Activity: Inactive   Days of Exercise per Week: 0 days   Minutes of Exercise per Session: 0 min  Stress: No Stress Concern Present   Feeling of Stress : Not at all  Social Connections: Not on file  Intimate Partner Violence: Not on file    Family History  Problem Relation Age of Onset   Hypertension Mother    Diabetes Mother    Cervical cancer Mother  Heart attack Father    Hypertension Sister    Hypertension Brother    Hypertension Sister    Hypertension Sister    Prostate cancer Brother    HIV/AIDS Brother     ROS- All systems are reviewed and negative except as per the HPI above  Physical Exam: Telemetry:  afib--> sinus Vitals:   04/22/21 0830 04/22/21 0900 04/22/21 1000 04/22/21 1030  BP: (!) 155/44 (!) 151/54 (!) 142/63 (!) 162/43  Pulse: 67 69 69 73  Resp: 19 15 15 16   Temp:      TempSrc:      SpO2: 97% 100% 100% 97%  Weight:      Height:        GEN- The patient is well appearing, alert and oriented x 3 today.   Head- normocephalic, atraumatic Eyes-  Sclera clear, conjunctiva pink Ears- hearing intact Oropharynx- clear Neck- supple, elevated JVP Lungs- Clear to ausculation bilaterally, normal work of breathing Heart- Regular rate and rhythm, 3/6 SEM RUSB GI- soft, NT, ND, + BS Extremities- no clubbing, cyanosis, or edema MS- no significant deformity or atrophy Skin- no rash or lesion Psych- euthymic mood, full affect Neuro- strength and sensation are intact  EKG-  afib with RVR 04/22/21  Labs:   reviewed  Echo  12/28/20  reviewed  ASSESSMENT AND PLAN:   Paroxysmal atrial  fibrillation Occurs in the setting of myelodysplastic syndrome with profound anemia, ESRD, severe PMH, severe AS, amd moderate MS.  This is a very difficult situation.  She is a poor candidate for anticoagulation.  She has fortunately, converted to sinus with IV amiodarone.  Given ESRD, she does not have other AAD options.  I agree with Dr Renella Cunas that she is not a candidate for watchman.  She is also a poor ablation candidate with high risks and low anticipated success. Her ideal approach would be surgical AVR, possible MV repair, with MAZE and LAA ligation at a tertiary center.  Unfortunately, her severe pulmonary HTN and ESRD are likely prohibitive of this also.  I will obtain EKG to confirm sinus.  If she remains in sinus tomorrow am, ok to discharge on amiodarone 200mg  BID with follow-up in the AF clinic.     Thompson Grayer, MD 04/22/2021  12:28 PM

## 2021-04-22 NOTE — Consult Note (Signed)
Renal Service Consult Note [facility]  Patricia Mata 04/22/2021 Sol Blazing, MD Requesting Physician: Dr. Candiss Norse, Patricia Mata.   Reason for Consult: ESRD pt w/ atrial fib and RVR HPI: The patient is a 65 y.o. year-old w/ hx of chronic diast CHF, DM2 on insulin, ESRD on HD, HTN, hypothyroidism, myelodysplastic syndrome, PAD, pulm HTN who presented to ED on 10/8 sent from HD , only able to complete 2 hrs of HD due to high heart rates.  In ED pt w/ new onset atrial fibrillation. No CP/ SOB. CXR negative, creat 3.3, heart rate 130s then improved to 110's in ED.  Pt was admitted. Asked to see for ESRD.     Pt seen in room.  No c/o today.  Heart rate better in the 80's now.  No SOB or CP.    ROS - denies CP, no joint pain, no HA, no blurry vision, no rash, no diarrhea, no nausea/ vomiting, no dysuria, no difficulty voiding   Past Medical History  Past Medical History:  Diagnosis Date   Acute renal failure (ARF) ([place]) 06/08/2018   TTHS- [place]   Carotid artery disease ([place])    Chronic diastolic (congestive) heart failure ([place])    Colon cancer ([place]) 2008   Diabetes mellitus ([place])    Dyspnea    ESRD on hemodialysis ([place])    Gout 09/08/2018   Heart attack ([place])    Heart murmur    Hypertension    Hypothyroidism    Macrocytic anemia    MDS (myelodysplastic syndrome) (HCC)    Moderate mitral stenosis    PAD (peripheral artery disease) (HCC)    PAF (paroxysmal atrial fibrillation) (HCC)    Pulmonary hypertension (HCC)    Renal artery stenosis (HCC)    a. >60% R renal artery stenosis by duplex 2021, managed medically.   Severe aortic stenosis    Transfusion-dependent anemia    Past Surgical History  Past Surgical History:  Procedure Laterality Date   AV FISTULA PLACEMENT Right 09/26/2020   Procedure: RIGHT ARM ARTERIOVENOUS (AV) FISTULA CREATION;  Surgeon: Waynetta Sandy, MD;  Location: [place];  Service: Vascular;  Laterality: Right;  Monitor  Anesthesia Care with Regional Block to Right Arm    [place] Right 12/13/2020   Procedure: RIGHT UPPER EXTREMITY ARTERIOVENOUS FISTULA TRANSPOSITION;  Surgeon: Waynetta Sandy, MD;  Location: [place];  Service: Vascular;  Laterality: Right;   COLON SURGERY     COLONOSCOPY WITH PROPOFOL N/A 02/22/2021   Procedure: COLONOSCOPY WITH PROPOFOL;  Surgeon: Otis Brace, MD;  Location: [place];  Service: Gastroenterology;  Laterality: N/A;   DEBRIDMENT OF DECUBITUS ULCER N/A 06/12/2018   Procedure: DEBRIDMENT OF DECUBITUS ULCER;  Surgeon: Wallace Going, DO;  Location: [facility];  Service: Plastics;  Laterality: N/A;   ENTEROSCOPY N/A 02/22/2021   Procedure: ENTEROSCOPY;  Surgeon: Otis Brace, MD;  [facility];  Service: Gastroenterology;  Laterality: N/A;   ESOPHAGOGASTRODUODENOSCOPY N/A 09/01/2020   Procedure: ESOPHAGOGASTRODUODENOSCOPY (EGD);  Surgeon: Clarene Essex, MD;  Location: [facility];  Service: Endoscopy;  Laterality: N/A;   GIVENS CAPSULE STUDY N/A 09/01/2020   Procedure: GIVENS CAPSULE STUDY;  Surgeon: Clarene Essex, MD;  [facility];  Service: Endoscopy;  Laterality: N/A;   HEMOSTASIS CLIP PLACEMENT  02/22/2021   Procedure: HEMOSTASIS CLIP PLACEMENT;  Surgeon: Otis Brace, MD;  Location: [place];  Service: Gastroenterology;;   HOT HEMOSTASIS N/A 02/22/2021   Procedure: HOT HEMOSTASIS (ARGON PLASMA COAGULATION/BICAP);  Surgeon: Alessandra Bevels,  Orson Gear, MD;  Location: [place];  Service: Gastroenterology;  Laterality: N/A;   IR FLUORO GUIDE CV LINE RIGHT  10/11/2020   IR FLUORO GUIDE PORT INSERTION RIGHT  04/02/2017   IR REMOVAL TUN ACCESS W/ PORT W/O FL MOD SED  03/16/2019   IR US GUIDE VASC ACCESS RIGHT  04/02/2017   IR US GUIDE VASC ACCESS RIGHT  10/11/2020   LIGATION OF COMPETING BRANCHES OF ARTERIOVENOUS FISTULA Right 12/13/2020   Procedure: LIGATION OF COMPETING BRANCHES OF RIGHT UPPER EXTREMITY ARTERIOVENOUS FISTULA;   Surgeon: Waynetta Sandy, MD;  Location: [place];  Service: Vascular;  Laterality: Right;   POLYPECTOMY  02/22/2021   Procedure: POLYPECTOMY;  Surgeon: Otis Brace, MD;  Location: [place] [facility];  Service: Gastroenterology;;   RIGHT HEART CATH N/A 04/23/2019   Procedure: RIGHT HEART CATH;  Surgeon: Jolaine Artist, MD;  Location: [place] [facility];  Service: Cardiovascular;  Laterality: N/A;   RIGHT/LEFT HEART CATH AND CORONARY ANGIOGRAPHY N/A 05/21/2018   Procedure: RIGHT/LEFT HEART CATH AND CORONARY ANGIOGRAPHY;  Surgeon: Nelva Bush, MD;  Location: [place] [facility];  Service: Cardiovascular;  Laterality: N/A;   RIGHT/LEFT HEART CATH AND CORONARY ANGIOGRAPHY N/A 10/13/2020   Procedure: RIGHT/LEFT HEART CATH AND CORONARY ANGIOGRAPHY;  Surgeon: Jolaine Artist, MD;  Location: [place] [facility];  Service: Cardiovascular;  Laterality: N/A;   TUBAL LIGATION     Family History  Family History  Problem Relation Age of Onset   Hypertension Mother    Diabetes Mother    Cervical cancer Mother    Heart attack Father    Hypertension Sister    Hypertension Brother    Hypertension Sister    Hypertension Sister    Prostate cancer Brother    HIV/AIDS Brother    Social History  reports that she quit smoking about 25 years ago. Her smoking use included cigarettes. She has a 5.00 pack-year smoking history. She has quit using smokeless tobacco. She reports current alcohol use of about 1.0 standard drink per week. She reports that she does not use drugs. Allergies  Allergies  Allergen Reactions   Lactose Intolerance (Gi) Diarrhea   Morphine And Related Itching   Sulfa Antibiotics Rash and Other (See Comments)    Blisters, also   Home medications Prior to Admission medications   Medication Sig Start Date End Date Taking? Authorizing Provider  allopurinol (ZYLOPRIM) 300 MG tablet Take 300 mg by mouth daily. 07/05/20  Yes [provider]  amiodarone (PACERONE) 200 MG  tablet Take 1 tablet (200 mg total) by mouth daily. 07/19/20  Yes Bensimhon, Shaune Pascal, MD  atorvastatin (LIPITOR) 40 MG tablet Take 1 tablet by mouth once daily 11/13/20  Yes [name], FNP  carvedilol (COREG) 3.125 MG tablet Take 1 tablet by mouth twice daily 04/16/21  Yes Clegg, Amy D, NP  cholecalciferol (VITAMIN D3) 25 MCG (1000 UNIT) tablet Take 1,000 Units by mouth daily.   Yes [provider]  cloNIDine (CATAPRES) 0.1 MG tablet Take 1 tablet (0.1 mg total) by mouth 2 (two) times daily. 07/03/20  Yes Bensimhon, Shaune Pascal, MD  hydrALAZINE (APRESOLINE) 100 MG tablet Take 1 tablet (100 mg total) by mouth 3 (three) times daily. 09/27/20  Yes Lorretta Harp, MD  Insulin Glargine [facility]) 100 UNIT/ML Inject 15 Units into the skin daily as needed (only if blood sugar over 150). Only if above 150   Yes [provider]  latanoprost (XALATAN) 0.005 % ophthalmic solution Place 1 drop  into both eyes at bedtime.    Yes [provider]  levothyroxine (SYNTHROID) 150 MCG tablet TAKE 1 TABLET BY MOUTH ONCE DAILY BEFORE BREAKFAST 04/16/21  Yes [name], FNP  lidocaine-prilocaine (EMLA) cream Apply 1 application topically daily as needed (port access). 01/23/21  Yes [provider]  linagliptin (TRADJENTA) 5 MG TABS tablet Take 1 tablet (5 mg total) by mouth daily. 04/05/21  Yes [name], FNP  Multiple Vitamins-Minerals (CENTRUM SILVER 50+WOMEN) TABS Take 1 tablet by mouth daily.   Yes [provider]  polyethylene glycol (MIRALAX / GLYCOLAX) 17 g packet Take 17 g by mouth daily as needed for moderate constipation. 01/14/21  Yes Vann, Jessica U, DO  sildenafil (REVATIO) 20 MG tablet TAKE 1 TABLET BY MOUTH THREE TIMES DAILY Patient taking differently: Take 20 mg by mouth 3 (three) times daily. 02/05/21  Yes Sherren Mocha, MD  blood glucose meter kit and supplies Dispense based on patient and insurance preference. Use up to four times daily as directed.  (FOR ICD-10 E10.9, E11.9). 11/04/18   [name], FNP  Blood Glucose Monitoring Suppl [facility] Karsten Fells) w/Device KIT Check blood sugars twice daily E11.9 11/30/19   [name], FNP  glucose blood test strip check blood sugars twice daily Dx code E11.22 03/02/20   [name], FNP  Insulin Pen Needle 29G X 5MM MISC Use as directed 06/16/17   Bonnielee Haff, MD  Lancets (ONETOUCH DELICA PLUS [id number]) [place] check blood sugars twice daily Dx code: E11.22 03/02/20   [name], FNP  omeprazole (PRILOSEC) 40 MG capsule Take 1 capsule (40 mg total) by mouth 2 (two) times daily. Patient not taking: Reported on 04/21/2021 04/16/21   [name], FNP     Vitals:   04/22/21 1000 04/22/21 1030 04/22/21 1255 04/22/21 1637  BP: (!) 142/63 (!) 162/43 133/64 (!) 149/59  Pulse: 69 73 79 69  Resp: '15 16 16 ' (!) 8  Temp:   98.2 F (36.8 C) 99 F (37.2 C)  TempSrc:   Oral Oral  SpO2: 100% 97%    Weight:      Height:       Exam Gen alert, no distress No rash, cyanosis or gangrene Sclera anicteric, throat clear  No jvd or bruits Chest clear bilat to bases, no rales/ wheezing RRR no MRG Abd soft ntnd no mass or ascites +bs GU normal MS no joint effusions or deformity Ext no LE or UE edema, no wounds or ulcers Neuro is alert, Ox 3 , nf  RUA aVF+bruit        Home meds include - lipiotor, coreg 3.125 bid, clonidine 0.1 bid, hydralazine 100 tid, insulin lantus 15u, synthroid 150, linagliptin, prilosec , prn's, vit/ supps, amiodarone, zyloprim     OP HD: TTS [facility]   4h   400/1.5  87kg  2/2 bath  R AVF  Hep 2000   - mircera 225 last 10/6   - calcitriol 0.25 tiw po   Assessment/ Plan: Atrial w/ RVR - seen by cardiology, getting IV amiodarone, HR's better. Also coreg.  ESRD - on HD TTS. Had 1/2 of last session on 10/8.  Labs and volume are okay. Next HD on 10/11 if still here.  DM2 on insulin  MDS - gets transfusions prn as OP. Hb 8.7 here. F/b Dr Alen Blew.  HTN/ vol - getting coreg  here, BP's a bit high. No vol excess on exam. Up 1-2 kg by wts.  Anemia ckd - last esas was 10/6, 3  days ago. Next esa due 11 days.  MBD ckd - cont vdra, Ca ok      Patricia Aisa Schoeppner  MD 04/22/2021, 5:03 PM  Recent Labs  Lab 04/22/21 0240 04/22/21 1244  WBC 4.3 4.2  HGB 8.7* 8.7*   Recent Labs  Lab 04/22/21 0240 04/22/21 1318  K 3.5 3.8  BUN 42* 45*  CREATININE 4.64* 4.81*  CALCIUM 9.5 9.8  PHOS  --  4.6

## 2021-04-22 NOTE — Progress Notes (Signed)
PROGRESS NOTE                                                                                                                                                                                                             Patient Demographics:    Patricia Mata, is a 65 y.o. female, DOB - [birth year], QIO:[id number]  Outpatient Primary MD for the patient is [name], FNP    LOS - 0  Admit date - 04/21/2021    Chief Complaint  Patient presents with   Atrial Fibrillation       Brief Narrative (HPI from H&P)  Patricia Mata is a 65 y.o. female with medical history significant of abnormal uterine bleeding, CHF, anemia, CAD, aortic stenosis, carotid artery disease, diabetes, hypertension, gout, hyperlipidemia, hypothyroidism, colon cancer history, MDS on palliative treatment, prior episode of A. fib who presents from her HD session after having lower blood pressures and found to be in A. fib with RVR.   Subjective:    [name] today has, No headache, No chest pain, No abdominal pain - No Nausea, No new weakness tingling or numbness, no SOB.   Assessment  & Plan :    Principal Problem:   Atrial fibrillation with RVR (HCC) Active Problems:   Anemia due to chronic kidney disease   MDS (myelodysplastic syndrome) (HCC)   Essential hypertension   PAF (paroxysmal atrial fibrillation) (HCC)   Hypothyroidism   DM2 (diabetes mellitus, type 2) (HCC)   ESRD (end stage renal disease) (HCC)   Paroxysmal atrial fibrillation with RVR.  Seen by cardiology currently on amiodarone drip along with Coreg, CHA2DS2-VASc 2 score is greater than 3 however cardiology does not feel that patient is safe for anticoagulation, they do not recommend anticoagulation at this time.  Continue amiodarone and beta-blocker for rate control defer further management to cardiology.n.  2.  ESRD.  TTS schedule.  Nephrology consulted.  3.  Question of  pneumonia on chest x-ray.  Clinically ruled out.  4.  History of hypothyroidism.  On Synthroid check TSH.  5.  Pulmonary hypertension.  Stable on refashion.  6.  Dyslipidemia.  On statin.  7.  Hypertension.  Placed on Coreg moderate dose.  Monitor.  8.  History of colon cancer s/p surgical resection but now deemed to be disease-free, myelodysplastic syndrome.  Currently stable follows  with Dr. Alen Blew.  9.  DM type II.  On sliding scale  Lab Results  Component Value Date   HGBA1C 6.1 (H) 01/13/2021   CBG (last 3)  Recent Labs    04/21/21 2211 04/22/21 0717  GLUCAP 224* 111*           Condition - Extremely Guarded  Family Communication  :  none present  Code Status :  Full  Consults  :  Cards  PUD Prophylaxis :    Procedures  :            Disposition Plan  :    Status is: Observation  Dispo: The patient is from: Home              Anticipated d/c is to: Home              Patient currently is not medically stable to d/c.   Difficult to place patient No  DVT Prophylaxis  :  Heparin   Lab Results  Component Value Date   PLT 173 04/22/2021    Diet :  Diet Order             Diet renal/carb modified with fluid restriction Diet-HS Snack? Nothing; Fluid restriction: 1200 mL Fluid; Room service appropriate? Yes; Fluid consistency: Thin  Diet effective now                    Inpatient Medications  Scheduled Meds:  atorvastatin  40 mg Oral Daily   carvedilol  6.25 mg Oral BID WC   insulin aspart  0-6 Units Subcutaneous TID WC   latanoprost  1 drop Both Eyes QHS   levothyroxine  150 mcg Oral QAC breakfast   sildenafil  20 mg Oral TID   Continuous Infusions:  amiodarone 30 mg/hr (04/22/21 0909)   PRN Meds:.acetaminophen, ondansetron (ZOFRAN) IV  Antibiotics  :    Anti-infectives (From admission, onward)    Start     Dose/Rate Route Frequency Ordered Stop   04/22/21 1800  azithromycin (ZITHROMAX) 500 mg in sodium chloride 0.9 % 250 mL  IVPB  Status:  Discontinued        500 mg 250 mL/hr over 60 Minutes Intravenous  Once 04/21/21 2021 04/21/21 2116   04/22/21 1800  cefTRIAXone (ROCEPHIN) 1 g in sodium chloride 0.9 % 100 mL IVPB  Status:  Discontinued        1 g 200 mL/hr over 30 Minutes Intravenous Every 24 hours 04/21/21 2021 04/21/21 2116   04/21/21 1600  cefTRIAXone (ROCEPHIN) 1 g in sodium chloride 0.9 % 100 mL IVPB        1 g 200 mL/hr over 30 Minutes Intravenous  Once 04/21/21 1550 04/21/21 1644   04/21/21 1600  azithromycin (ZITHROMAX) 500 mg in sodium chloride 0.9 % 250 mL IVPB        500 mg 250 mL/hr over 60 Minutes Intravenous  Once 04/21/21 1550 04/21/21 1730        Time Spent in minutes  30   Lala Lund M.D on 04/22/2021 at 11:26 AM  To page go to [email or web address]   [facility] -  Office  [phone]  See all Orders from today for further details    Objective:   Vitals:   04/22/21 0830 04/22/21 0900 04/22/21 1000 04/22/21 1030  BP: (!) 155/44 (!) 151/54 (!) 142/63 (!) 162/43  Pulse: 67 69 69 73  Resp: 19 15 15 16   Temp:  TempSrc:      SpO2: 97% 100% 100% 97%  Weight:      Height:        Wt Readings from Last 3 Encounters:  04/21/21 89.1 kg  04/16/21 89.1 kg  03/26/21 90.3 kg     Intake/Output Summary (Last 24 hours) at 04/22/2021 1126 Last data filed at 04/21/2021 1730 Gross per 24 hour  Intake 350 ml  Output --  Net 350 ml     Physical Exam  Awake Alert, No new F.N deficits, Normal affect [place].AT,PERRAL Supple Neck, No JVD,   Symmetrical Chest wall movement, Good air movement bilaterally, CTAB RRR,No Gallops,Rubs or new Murmurs, No Parasternal Heave +ve B.Sounds, Abd Soft, No tenderness,   No Cyanosis, Clubbing or edema,        Data Review:    CBC Recent Labs  Lab 04/16/21 0816 04/21/21 1017 04/22/21 0240  WBC 4.1 4.4 4.3  HGB 8.4* 9.5* 8.7*  HCT 25.3* 28.6* 26.4*  PLT 155 187 173  MCV 89.7 90.2 92.3  MCH 29.8 30.0 30.4  MCHC 33.2 33.2 33.0   RDW 15.9* 15.9* 15.9*  LYMPHSABS 0.5*  --   --   MONOABS 0.8  --   --   EOSABS 0.0  --   --   BASOSABS 0.0  --   --     Recent Labs  Lab 04/21/21 1017 04/21/21 1600 04/22/21 0240  NA 135  --  135  K 3.4*  --  3.5  CL 95*  --  92*  CO2 27  --  32  GLUCOSE 197*  --  161*  BUN 24*  --  42*  CREATININE 3.31*  --  4.64*  CALCIUM 8.9  --  9.5  INR  --  1.0  --    Lab Results  Component Value Date   TSH 1.980 01/31/2021    ------------------------------------------------------------------------------------------------------------------ No results for input(s): CHOL, HDL, LDLCALC, TRIG, CHOLHDL, LDLDIRECT in the last 72 hours.  Lab Results  Component Value Date   HGBA1C 6.1 (H) 01/13/2021   ------------------------------------------------------------------------------------------------------------------ No results for input(s): TSH, T4TOTAL, T3FREE, THYROIDAB in the last 72 hours.  Invalid input(s): FREET3  Cardiac Enzymes No results for input(s): CKMB, TROPONINI, MYOGLOBIN in the last 168 hours.  Invalid input(s): CK ------------------------------------------------------------------------------------------------------------------    Component Value Date/Time   BNP 1,054.4 (H) 11/21/2020 1102      Radiology Reports DG Chest 2 View  Result Date: 04/21/2021 CLINICAL DATA:  Atrial fibrillation and diabetes. EXAM: CHEST - 2 VIEW COMPARISON:  December 26, 2020 FINDINGS: The heart size and mediastinal contours are stable. Mild opacity of the medial right lung base noted. There is no pulmonary edema or pleural effusion. The visualized skeletal structures are unremarkable. IMPRESSION: Mild opacity of the medial right lung base, developing pneumonia is not excluded. Electronically Signed   By: Abelardo Diesel M.D.   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 Patient has been on dialysis x 3              months. She denies any abdominal pain with eating fatty foods or              wanting to consume food. Appetite is present. High Risk Factors: Hyperlipidemia, Diabetes, past history of smoking. Other Factors: CKD stage 5                 Patient on kidney transplant waiting list.                 Most recent blood work                03/26/2021 BUN 44 Creatinie 5.14. Vascular Interventions: Placement of AVFistula right brachial -cephalic                         09/26/2020. Limitations: Air/bowel gas and obesity. Comparison Study: Prior renal arterial duplex exam on 02/08/2020 showed highest                   velocities in right renal artery 277 cm/s and left renal                   artery 191 cm/s. CA 326 cm/s and SMA 550 cm/s Performing Technologist: Alecia Mackin RVT, RDCS (AE), RDMS  Examination Guidelines: A complete evaluation includes B-mode imaging, spectral Doppler, color Doppler, and power Doppler as needed of all accessible portions of each vessel. Bilateral testing is considered an integral part of a complete examination. Limited examinations for reoccurring indications may be performed as noted.  Duplex Findings: +--------------------+--------+--------+------+--------+ Mesenteric          PSV cm/sEDV cm/sPlaqueComments +--------------------+--------+--------+------+--------+ Aorta Prox             49                          +--------------------+--------+--------+------+--------+ Aorta Distal          174                           +--------------------+--------+--------+------+--------+ Celiac Artery Origin  304                          +--------------------+--------+--------+------+--------+ SMA Proximal          576      58                  +--------------------+--------+--------+------+--------+  Mesenteric Technologist observations: mid SMA shows monophasic flow, post stenosis. Due to calcified walls of CA and SMA some areas were not able to Doppler within, can not rule out higher grade stenosis or occlusion within.    +------------------+--------+--------+-------+ Right Renal ArteryPSV cm/sEDV cm/sComment +------------------+--------+--------+-------+ Origin              382      17           +------------------+--------+--------+-------+ Proximal            125      13           +------------------+--------+--------+-------+ Mid                  92  14           +------------------+--------+--------+-------+ Distal               77      10           +------------------+--------+--------+-------+ +-----------------+--------+--------+-------+ Left Renal ArteryPSV cm/sEDV cm/sComment +-----------------+--------+--------+-------+ Origin              95      24           +-----------------+--------+--------+-------+ Proximal           135      18           +-----------------+--------+--------+-------+ Mid                180      27           +-----------------+--------+--------+-------+ Distal             127      22           +-----------------+--------+--------+-------+  Technologist observations: The right renal artery ostium, and SMA have increased in velocities since prior exam 01/2020. Unable to duplicate the right renal artery velocities in the proximal and mid segments. +------------+--------+--------+----+-----------+--------+--------+----+ Right KidneyPSV cm/sEDV cm/sRI  Left KidneyPSV cm/sEDV cm/sRI    +------------+--------+--------+----+-----------+--------+--------+----+ Upper Pole  19      5       0.74Upper Pole 14      5       0.63 +------------+--------+--------+----+-----------+--------+--------+----+ Mid         17      5       0.70Mid        42      6       0.85 +------------+--------+--------+----+-----------+--------+--------+----+ Lower Pole  18      0       0.98Lower Pole 23      1       0.95 +------------+--------+--------+----+-----------+--------+--------+----+ Hilar       33      7       0.79Hilar      68      9       0.87 +------------+--------+--------+----+-----------+--------+--------+----+ +------------------+--------+------------------+--------+ Right Kidney              Left Kidney                +------------------+--------+------------------+--------+ RAR                       RAR                        +------------------+--------+------------------+--------+ RAR (manual)      7.8     RAR (manual)      3.7      +------------------+--------+------------------+--------+ Cortex                    Cortex                     +------------------+--------+------------------+--------+ Cortex thickness  12.00 mmCorex thickness   16.00 mm +------------------+--------+------------------+--------+ Kidney length (cm)10.1    Kidney length (cm)11.0     +------------------+--------+------------------+--------+  Summary: Largest Aortic Diameter: 2.7 cm  Renal:  Right: Normal size right kidney. Abnormal right Resistive Index.        Normal cortical thickness of right kidney. Evidence of a >        60% stenosis of the right renal  artery. RRV flow present. Left:  LRV flow present. Normal size of left kidney. Abnormal left        Resisitve Index. Normal cortical thickness of the left        kidney. 1-59% stenosis of the left renal artery. Mesenteric: 70 to 99% stenosis in the celiac artery and superior mesenteric artery. Severe atherosclerosis  noted in aorta, CA and SMA.  *See table(s) above for measurements and observations.  Consider Vascular consult if clinically indicated.  Diagnosing physician: Kathlyn Sacramento MD  Electronically signed by Kathlyn Sacramento MD on 04/03/2021 at 3:59:22 PM.    Final

## 2021-04-22 NOTE — Plan of Care (Signed)
Progressing, will continue to monitor.  

## 2021-04-23 DIAGNOSIS — I4891 Unspecified atrial fibrillation: Secondary | ICD-10-CM | POA: Diagnosis not present

## 2021-04-23 LAB — TSH: TSH: 0.616 u[IU]/mL (ref 0.350–4.500)

## 2021-04-23 LAB — COMPREHENSIVE METABOLIC PANEL
ALT: 28 U/L (ref 0–44)
AST: 45 U/L — ABNORMAL HIGH (ref 15–41)
Albumin: 3.1 g/dL — ABNORMAL LOW (ref 3.5–5.0)
Alkaline Phosphatase: 129 U/L — ABNORMAL HIGH (ref 38–126)
Anion gap: 13 (ref 5–15)
BUN: 55 mg/dL — ABNORMAL HIGH (ref 8–23)
CO2: 29 mmol/L (ref 22–32)
Calcium: 9.9 mg/dL (ref 8.9–10.3)
Chloride: 92 mmol/L — ABNORMAL LOW (ref 98–111)
Creatinine, Ser: 5.23 mg/dL — ABNORMAL HIGH (ref 0.44–1.00)
GFR, Estimated: 9 mL/min — ABNORMAL LOW (ref >60)
Glucose, Bld: 120 mg/dL — ABNORMAL HIGH (ref 70–99)
Potassium: 3.4 mmol/L — ABNORMAL LOW (ref 3.5–5.1)
Sodium: 134 mmol/L — ABNORMAL LOW (ref 135–145)
Total Bilirubin: 0.7 mg/dL (ref 0.3–1.2)
Total Protein: 6.6 g/dL (ref 6.5–8.1)

## 2021-04-23 LAB — CBC WITH DIFFERENTIAL/PLATELET
Abs Immature Granulocytes: 0.02 10*3/uL (ref 0.00–0.07)
Basophils Absolute: 0 10*3/uL (ref 0.0–0.1)
Basophils Relative: 0 %
Eosinophils Absolute: 0.1 10*3/uL (ref 0.0–0.5)
Eosinophils Relative: 1 %
HCT: 25.3 % — ABNORMAL LOW (ref 36.0–46.0)
Hemoglobin: 8.7 g/dL — ABNORMAL LOW (ref 12.0–15.0)
Immature Granulocytes: 0 %
Lymphocytes Relative: 17 %
Lymphs Abs: 0.8 10*3/uL (ref 0.7–4.0)
MCH: 30.5 pg (ref 26.0–34.0)
MCHC: 34.4 g/dL (ref 30.0–36.0)
MCV: 88.8 fL (ref 80.0–100.0)
Monocytes Absolute: 0.9 10*3/uL (ref 0.1–1.0)
Monocytes Relative: 19 %
Neutro Abs: 2.9 10*3/uL (ref 1.7–7.7)
Neutrophils Relative %: 63 %
Platelets: 202 10*3/uL (ref 150–400)
RBC: 2.85 MIL/uL — ABNORMAL LOW (ref 3.87–5.11)
RDW: 15.9 % — ABNORMAL HIGH (ref 11.5–15.5)
WBC: 4.6 10*3/uL (ref 4.0–10.5)
nRBC: 0 % (ref 0.0–0.2)

## 2021-04-23 LAB — GLUCOSE, CAPILLARY
Glucose-Capillary: 137 mg/dL — ABNORMAL HIGH (ref 70–99)
Glucose-Capillary: 137 mg/dL — ABNORMAL HIGH (ref 70–99)

## 2021-04-23 LAB — MAGNESIUM: Magnesium: 1.9 mg/dL (ref 1.7–2.4)

## 2021-04-23 MED ORDER — AMIODARONE HCL 200 MG PO TABS
200.0000 mg | ORAL_TABLET | Freq: Two times a day (BID) | ORAL | Status: DC
  Administered 2021-04-23: 200 mg via ORAL
  Filled 2021-04-23: qty 1

## 2021-04-23 MED ORDER — HYDRALAZINE HCL 50 MG PO TABS
100.0000 mg | ORAL_TABLET | Freq: Three times a day (TID) | ORAL | Status: DC
  Administered 2021-04-23: 100 mg via ORAL
  Filled 2021-04-23: qty 2

## 2021-04-23 MED ORDER — AMIODARONE HCL 200 MG PO TABS: ORAL_TABLET | ORAL | 0 refills | Status: DC

## 2021-04-23 MED ORDER — CARVEDILOL 6.25 MG PO TABS: 6.2500 mg | ORAL_TABLET | Freq: Two times a day (BID) | ORAL | 0 refills | Status: DC

## 2021-04-23 NOTE — Progress Notes (Signed)
Pt being d/c, VSS, IV removed, education complete  Chrisandra Carota, RN 04/23/2021 12:05 PM

## 2021-04-23 NOTE — Discharge Summary (Signed)
Cloey Sferrazza RTM:[id number] DOB: 09-25-1955 DOA: 04/21/2021  PCP: [name], FNP  Admit date: 04/21/2021  Discharge date: 04/23/2021  Admitted From: Home  Disposition:  Home   Recommendations for Outpatient Follow-up:   Follow up with PCP in 1-2 weeks  PCP Please obtain BMP/CBC, 2 view CXR in 1week,  (see Discharge instructions)   PCP Please follow up on the following pending results:    Home Health: None   Equipment/Devices: None  Consultations: Cards, EP, Renal Discharge Condition: Stable    CODE STATUS: Full    Diet Recommendation: Heart Healthy Renal with 1.5lit/ day fluid restriction  Chief Complaint  Patient presents with   Atrial Fibrillation     Brief history of present illness from the day of admission and additional interim summary    Deena Shaub is a 65 y.o. female with medical history significant of abnormal uterine bleeding, CHF, anemia, CAD, aortic stenosis, carotid artery disease, diabetes, hypertension, gout, hyperlipidemia, hypothyroidism, colon cancer history, MDS on palliative treatment, prior episode of A. fib who presents from her HD session after having lower blood pressures and found to be in A. fib with RVR.                                                                 Hospital Course    Paroxysmal atrial fibrillation with RVR.  Seen by cardiology was on amiodarone drip along with Coreg, CHA2DS2-VASc 2 score is greater than 3 however cardiology does not feel that patient is safe for anticoagulation due H/O Anemia and GI Bleed, currently in sinus Case discussed with Dr. Sallyanne Kuster cardiology along with the EP team plan is to discharge her on Coreg along with amiodarone combination with outpatient cardiology follow-up.   2.  ESRD.  TTS schedule.  Nephrology consulted.   She will continue outpatient schedule unchanged for 6   3.  Question of pneumonia on chest x-ray.  Clinically ruled out.   4.  History of hypothyroidism.  On Synthroid, stable TSH.  5.  Pulmonary hypertension.  Stable on Home Rx   6.  Dyslipidemia.  On statin.   7.  Hypertension.  Currently stable on higher than home dose Coreg along with hydralazine combination, Catapres held.   8.  History of colon cancer s/p surgical resection but now deemed to be disease-free, myelodysplastic syndrome.  Currently stable follows with Dr. Alen Blew.   9.  DM type II.  Continue home regimen unchanged.     Discharge diagnosis     Principal Problem:   Atrial fibrillation with RVR (HCC) Active Problems:   Anemia due to chronic kidney disease   MDS (myelodysplastic syndrome) (HCC)   Essential hypertension   PAF (paroxysmal atrial fibrillation) (HCC)   Hypothyroidism   DM2 (diabetes mellitus, type 2) ([place])  ESRD (end stage renal disease) [facility])    Discharge instructions    Discharge Instructions     Amb referral to AFIB Clinic   Complete by: As directed    Discharge instructions   Complete by: As directed    Follow with Primary MD [name], FNP in 7 days   Get CBC, CMP, 2 view Chest X ray -  checked next visit within 1 week by Primary MD    Activity: As tolerated with Full fall precautions use walker/cane & assistance as needed  Disposition Home    Diet: Heart Healthy Renal with 1.5 lit/day fluid restriction  Check your Weight same time everyday, if you gain over 2 pounds, or you develop in leg swelling, experience more shortness of breath or chest pain, call your Primary MD immediately. Follow Cardiac Low Salt Diet and 1.5 lit/day fluid restriction.  Special Instructions: If you have smoked or chewed Tobacco  in the last 2 yrs please stop smoking, stop any regular Alcohol  and or any Recreational drug use.  On your next visit with your primary care physician please Get  Medicines reviewed and adjusted.  Please request your Prim.MD to go over all Hospital Tests and Procedure/Radiological results at the follow up, please get all Hospital records sent to your Prim MD by signing hospital release before you go home.  If you experience worsening of your admission symptoms, develop shortness of breath, life threatening emergency, suicidal or homicidal thoughts you must seek medical attention immediately by calling 911 or calling your MD immediately  if symptoms less severe.  You Must read complete instructions/literature along with all the possible adverse reactions/side effects for all the Medicines you take and that have been prescribed to you. Take any new Medicines after you have completely understood and accpet all the possible adverse reactions/side effects.   Increase activity slowly   Complete by: As directed    No wound care   Complete by: As directed        Discharge Medications   Allergies as of 04/23/2021       Reactions   Lactose Intolerance (gi) Diarrhea   Morphine And Related Itching   Sulfa Antibiotics Rash, Other (See Comments)   Blisters, also        Medication List     STOP taking these medications    cloNIDine 0.1 MG tablet Commonly known as: Catapres       TAKE these medications    allopurinol 300 MG tablet Commonly known as: ZYLOPRIM Take 300 mg by mouth daily.   amiodarone 200 MG tablet Commonly known as: PACERONE Take 1 pill twice a day for 1 week then take once daily What changed:  how much to take how to take this when to take this additional instructions   atorvastatin 40 MG tablet Commonly known as: LIPITOR Take 1 tablet by mouth once daily   Basaglar KwikPen 100 UNIT/ML Inject 15 Units into the skin daily as needed (only if blood sugar over 150). Only if above 150   blood glucose meter kit and supplies Dispense based on patient and insurance preference. Use up to four times daily as directed. (FOR  ICD-10 E10.9, E11.9).   carvedilol 6.25 MG tablet Commonly known as: COREG Take 1 tablet (6.25 mg total) by mouth 2 (two) times daily with a meal. What changed:  medication strength how much to take when to take this   Centrum Silver 50+Women Tabs Take 1 tablet by mouth daily.  cholecalciferol 25 MCG (1000 UNIT) tablet Commonly known as: VITAMIN D3 Take 1,000 Units by mouth daily.   glucose blood test strip check blood sugars twice daily Dx code E11.22   hydrALAZINE 100 MG tablet Commonly known as: APRESOLINE Take 1 tablet (100 mg total) by mouth 3 (three) times daily.   Insulin Pen Needle 29G X 5MM Misc Use as directed   latanoprost 0.005 % ophthalmic solution Commonly known as: XALATAN Place 1 drop into both eyes at bedtime.   levothyroxine 150 MCG tablet Commonly known as: SYNTHROID TAKE 1 TABLET BY MOUTH ONCE DAILY BEFORE BREAKFAST   lidocaine-prilocaine cream Commonly known as: EMLA Apply 1 application topically daily as needed (port access).   linagliptin 5 MG Tabs tablet Commonly known as: Tradjenta Take 1 tablet (5 mg total) by mouth daily.   omeprazole 40 MG capsule Commonly known as: PRILOSEC Take 1 capsule (40 mg total) by mouth 2 (two) times daily.   OneTouch Delica Plus [id number] Misc check blood sugars twice daily Dx code: E11.22   OneTouch UltraLink w/Device Kit Check blood sugars twice daily E11.9   polyethylene glycol 17 g packet Commonly known as: MIRALAX / GLYCOLAX Take 17 g by mouth daily as needed for moderate constipation.   sildenafil 20 MG tablet Commonly known as: REVATIO TAKE 1 TABLET BY MOUTH THREE TIMES DAILY         Follow-up Information     [facility] Follow up.   Specialty: Cardiology Why: 05/02/21 @ 11:00AM with Adline Peals, PA-C Contact information: [address] [id number] [place] [address] [place], [place], [place]. Schedule an  appointment as soon as possible for a visit in 1 week(s).   Specialty: General Practice Contact information: [address] [place] [address] [phone]         Lorretta Harp, MD .   Specialties: Cardiology, Radiology Contact information: [address] [place] [address] [phone]         Bensimhon, Shaune Pascal, MD. Schedule an appointment as soon as possible for a visit in 1 week(s).   Specialty: Cardiology Contact information: [address] [place] [address] [phone]                 Major procedures and Radiology Reports - PLEASE review detailed and final reports thoroughly  -       DG Chest 2 View  Result Date: 04/21/2021 CLINICAL DATA:  Atrial fibrillation and diabetes. EXAM: CHEST - 2 VIEW COMPARISON:  December 26, 2020 FINDINGS: The heart size and mediastinal contours are stable. Mild opacity of the medial right lung base noted. There is no pulmonary edema or pleural effusion. The visualized skeletal structures are unremarkable. IMPRESSION: Mild opacity of the medial right lung base, developing pneumonia is not excluded. Electronically Signed   By: Abelardo Diesel M.D.   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 Patient has been on dialysis x 3              months. She denies any abdominal pain with eating fatty foods or              wanting to  consume food. Appetite is present. High Risk Factors: Hyperlipidemia, Diabetes, past history of smoking. Other Factors: CKD stage 5                 Patient on kidney transplant waiting list.                 Most recent blood work                03/26/2021 BUN 44 Creatinie 5.14. Vascular Interventions: Placement of AVFistula right brachial -cephalic                         09/26/2020. Limitations: Air/bowel gas and obesity. Comparison Study: Prior renal arterial duplex exam on 02/08/2020 showed highest                   velocities in right renal artery 277 cm/s and left renal                   artery 191 cm/s. CA 326 cm/s and SMA 550 cm/s Performing Technologist: Alecia Mackin RVT, RDCS (AE), RDMS  Examination Guidelines: A complete evaluation includes B-mode imaging, spectral Doppler, color Doppler, and power Doppler as needed of all accessible portions of each vessel. Bilateral testing is considered an integral part of a complete examination. Limited examinations for reoccurring indications may be performed as noted.  Duplex Findings: +--------------------+--------+--------+------+--------+ Mesenteric          PSV cm/sEDV cm/sPlaqueComments +--------------------+--------+--------+------+--------+ Aorta Prox             49                          +--------------------+--------+--------+------+--------+ Aorta Distal          174                          +--------------------+--------+--------+------+--------+ Celiac Artery Origin  304                          +--------------------+--------+--------+------+--------+ SMA Proximal          576      58                  +--------------------+--------+--------+------+--------+  Mesenteric Technologist observations: mid SMA shows monophasic flow, post stenosis. Due to calcified walls of CA and SMA some areas were not able to Doppler within, can not rule out higher grade stenosis or occlusion within.     +------------------+--------+--------+-------+ Right Renal ArteryPSV cm/sEDV cm/sComment +------------------+--------+--------+-------+ Origin              382      17           +------------------+--------+--------+-------+ Proximal            125      13           +------------------+--------+--------+-------+ Mid  92      14           +------------------+--------+--------+-------+ Distal               77      10           +------------------+--------+--------+-------+ +-----------------+--------+--------+-------+ Left Renal ArteryPSV cm/sEDV cm/sComment +-----------------+--------+--------+-------+ Origin              95      24           +-----------------+--------+--------+-------+ Proximal           135      18           +-----------------+--------+--------+-------+ Mid                180      27           +-----------------+--------+--------+-------+ Distal             127      22           +-----------------+--------+--------+-------+  Technologist observations: The right renal artery ostium, and SMA have increased in velocities since prior exam 01/2020. Unable to duplicate the right renal artery velocities in the proximal and mid segments. +------------+--------+--------+----+-----------+--------+--------+----+ Right KidneyPSV cm/sEDV cm/sRI  Left KidneyPSV cm/sEDV cm/sRI   +------------+--------+--------+----+-----------+--------+--------+----+ Upper Pole  19      5       0.74Upper Pole 14      5       0.63 +------------+--------+--------+----+-----------+--------+--------+----+ Mid         17      5       0.70Mid        42      6       0.85 +------------+--------+--------+----+-----------+--------+--------+----+ Lower Pole  18      0       0.98Lower Pole 23      1       0.95 +------------+--------+--------+----+-----------+--------+--------+----+ Hilar       33      7       0.79Hilar      68      9        0.87 +------------+--------+--------+----+-----------+--------+--------+----+ +------------------+--------+------------------+--------+ Right Kidney              Left Kidney                +------------------+--------+------------------+--------+ RAR                       RAR                        +------------------+--------+------------------+--------+ RAR (manual)      7.8     RAR (manual)      3.7      +------------------+--------+------------------+--------+ Cortex                    Cortex                     +------------------+--------+------------------+--------+ Cortex thickness  12.00 mmCorex thickness   16.00 mm +------------------+--------+------------------+--------+ Kidney length (cm)10.1    Kidney length (cm)11.0     +------------------+--------+------------------+--------+  Summary: Largest Aortic Diameter: 2.7 cm  Renal:  Right: Normal size right kidney. Abnormal right Resistive Index.        Normal cortical thickness of right kidney. Evidence of a >  60% stenosis of the right renal artery. RRV flow present. Left:  LRV flow present. Normal size of left kidney. Abnormal left        Resisitve Index. Normal cortical thickness of the left        kidney. 1-59% stenosis of the left renal artery. Mesenteric: 70 to 99% stenosis in the celiac artery and superior mesenteric artery. Severe atherosclerosis noted in aorta, CA and SMA.  *See table(s) above for measurements and observations.  Consider Vascular consult if clinically indicated.  Diagnosing physician: Kathlyn Sacramento MD  Electronically signed by Kathlyn Sacramento MD on 04/03/2021 at 3:59:22 PM.    Final      Today   Subjective    Teressa Senter today has no headache,no chest abdominal pain,no new weakness tingling or numbness, feels much better wants to go home today.     Objective   Blood pressure 170/65, pulse 80, temperature 97.7 F (36.5 C), temperature source Oral, resp. rate 16, height _0   (1.626 m), weight 89.4 kg, SpO2 100 %.   Intake/Output Summary (Last 24 hours) at 04/23/2021 0941 Last data filed at 04/23/2021 0810 Gross per 24 hour  Intake 118 ml  Output 0 ml  Net 118 ml    Exam  Awake Alert, No new F.N deficits, Normal affect [place].AT,PERRAL Supple Neck,No JVD, No cervical lymphadenopathy appriciated.  Symmetrical Chest wall movement, Good air movement bilaterally, CTAB RRR,No Gallops,Rubs, loud systolic murmur +ve B.Sounds, Abd Soft, Non tender, No organomegaly appriciated, No rebound -guarding or rigidity. No Cyanosis, Clubbing or edema, No new Rash or bruise   Data Review   CBC w Diff:  Lab Results  Component Value Date   WBC 4.6 04/23/2021   HGB 8.7 (L) 04/23/2021   HGB 8.4 (L) 04/16/2021   HGB 8.0 (L) 09/05/2020   HGB 5.7 (LL) 05/26/2017   HCT 25.3 (L) 04/23/2021   HCT 24.3 (L) 09/05/2020   HCT 17.0 (L) 05/26/2017   PLT 202 04/23/2021   PLT 155 04/16/2021   PLT 187 09/05/2020   LYMPHOPCT 17 04/23/2021   LYMPHOPCT 11.1 (L) 05/26/2017   BANDSPCT 0 10/09/2020   MONOPCT 19 04/23/2021   MONOPCT 10.4 05/26/2017   EOSPCT 1 04/23/2021   EOSPCT 0.6 05/26/2017   BASOPCT 0 04/23/2021   BASOPCT 2.8 (H) 05/26/2017    CMP:  Lab Results  Component Value Date   NA 134 (L) 04/23/2021   NA 139 09/05/2020   NA 134 (L) 05/26/2017   K 3.4 (L) 04/23/2021   K 4.0 05/26/2017   CL 92 (L) 04/23/2021   CL 96 (L) 03/31/2012   CO2 29 04/23/2021   CO2 24 05/26/2017   BUN 55 (H) 04/23/2021   BUN 89 (HH) 09/05/2020   BUN 29.2 (H) 05/26/2017   CREATININE 5.23 (H) 04/23/2021   CREATININE 4.91 (HH) 04/04/2021   CREATININE 2.24 (H) 01/27/2019   CREATININE 1.4 (H) 05/26/2017   PROT 6.6 04/23/2021   PROT 7.6 05/11/2020   PROT 7.3 05/26/2017   ALBUMIN 3.1 (L) 04/23/2021   ALBUMIN 4.4 05/11/2020   ALBUMIN 3.2 (L) 05/26/2017   BILITOT 0.7 04/23/2021   BILITOT 1.0 04/04/2021   BILITOT 2.19 (H) 05/26/2017   ALKPHOS 129 (H) 04/23/2021   ALKPHOS 157 (H)  05/26/2017   AST 45 (H) 04/23/2021   AST 39 04/04/2021   AST 29 05/26/2017   ALT 28 04/23/2021   ALT 22 04/04/2021   ALT 23 05/26/2017  .   Total Time in preparing paper  work, Microbiologist and todays exam - 108 minutes  Lala Lund M.D on 04/23/2021 at 9:41 AM  [facility]

## 2021-04-23 NOTE — Progress Notes (Signed)
Case discussed with Dr. Rayann Heman. Amiodarone her only AAD option EKGs not done, though telemetry reviewed and she remains in SR 70's  Discharge on amiodarone 200mg  BID until her AFib clinic follow up appointment Appointment is made and in her AVS  Tommye Standard, PA-C

## 2021-04-23 NOTE — Progress Notes (Signed)
PT Cancellation Note  Patient Details Name: Patricia Mata MRN: [id number] DOB: 06/01/1956   Cancelled Treatment:    Reason Eval/Treat Not Completed: Patient declined, no reason specified;Other (comment); offered to practice steps with pt if needed, she declined stating felt no issues and has spouse to assist if needed.  Plans for home today and no follow up recommended.    Reginia Naas 04/23/2021, 11:47 AM Magda Kiel, PT Acute Rehabilitation Services SQZYT:[phone] Office:[phone] 04/23/2021

## 2021-04-23 NOTE — Progress Notes (Signed)
Discharge instructions (including medications) discussed with and copy provided to patient/caregiver 

## 2021-04-23 NOTE — Progress Notes (Signed)
[facility] Progress Note    Assessment/ Plan:   Atrial w/ RVR - seen by cardiology, getting IV amiodarone, HR's better. Also coreg. Not candidate for a/c ESRD - on HD TTS. Had 1/2 of last session on 10/8.  Labs and volume are okay. Next HD on 10/11 if still here.  DM2 on insulin  MDS - gets transfusions prn as OP. Hb 8.7 here. F/b Dr Alen Blew.  HTN/ vol - getting coreg here, BP's a bit high. No vol excess on exam. Up 1-2 kg by wts.  Anemia ckd - last esas was 10/6, not due for esa yet.  MBD ckd - cont vdra, Ca ok  OP HD: TTS [facility]   4h   400/1.5  87kg  2/2 bath  R AVF  Hep 2000   - mircera 225 last 10/6   - calcitriol 0.25 tiw po  Patricia Quint, MD [facility]  Subjective:   No acute events, HR controlled, possible d/c to home today per primary service.   Objective:   BP (!) 177/57 (BP Location: Left Arm)   Pulse 80   Temp 97.7 F (36.5 C) (Oral)   Resp 16   Ht 5\' 4"  (1.626 m)   Wt 89.4 kg   SpO2 100%   BMI 33.81 kg/m   Intake/Output Summary (Last 24 hours) at 04/23/2021 0848 Last data filed at 04/23/2021 0810 Gross per 24 hour  Intake 118 ml  Output 0 ml  Net 118 ml   Weight change: 0.259 kg  Physical Exam: Gen:nad CVS:s1s2, rrr Resp: no iwob YOV:ZCHY Ext:no edema Neuro: awake, alert HD access: RUE AVF  Imaging: DG Chest 2 View  Result Date: 04/21/2021 CLINICAL DATA:  Atrial fibrillation and diabetes. EXAM: CHEST - 2 VIEW COMPARISON:  December 26, 2020 FINDINGS: The heart size and mediastinal contours are stable. Mild opacity of the medial right lung base noted. There is no pulmonary edema or pleural effusion. The visualized skeletal structures are unremarkable. IMPRESSION: Mild opacity of the medial right lung base, developing pneumonia is not excluded. Electronically Signed   By: Abelardo Diesel M.D.   On: 04/21/2021 10:41    Labs: BMET Recent Labs  Lab 04/21/21 1017 04/22/21 0240 04/22/21 1318 04/23/21 0122  NA 135 135 135  134*  K 3.4* 3.5 3.8 3.4*  CL 95* 92* 93* 92*  CO2 27 32 29 29  GLUCOSE 197* 161* 105* 120*  BUN 24* 42* 45* 55*  CREATININE 3.31* 4.64* 4.81* 5.23*  CALCIUM 8.9 9.5 9.8 9.9  PHOS  --   --  4.6  --    CBC Recent Labs  Lab 04/21/21 1017 04/22/21 0240 04/22/21 1244 04/23/21 0122  WBC 4.4 4.3 4.2 4.6  NEUTROABS  --   --   --  2.9  HGB 9.5* 8.7* 8.7* 8.7*  HCT 28.6* 26.4* 26.4* 25.3*  MCV 90.2 92.3 92.6 88.8  PLT 187 173 141* 202    Medications:     atorvastatin  40 mg Oral Daily   [START ON 04/24/2021] calcitRIOL  0.25 mcg Oral Q T,Th,Sat-1800   carvedilol  6.25 mg Oral BID WC   Chlorhexidine Gluconate Cloth  6 each Topical Daily   heparin injection (subcutaneous)  5,000 Units Subcutaneous Q8H   insulin aspart  0-6 Units Subcutaneous TID WC   latanoprost  1 drop Both Eyes QHS   levothyroxine  150 mcg Oral QAC breakfast   sildenafil  20 mg Oral TID      [employer]  Candiss Norse, [facility] 04/23/2021, 8:48 AM

## 2021-04-23 NOTE — Plan of Care (Signed)
  Problem: Acute Rehab PT Goals(only PT should resolve) Goal: Pt Will Ambulate Outcome: Adequate for Discharge Goal: Pt Will Go Up/Down Stairs Outcome: Adequate for Discharge

## 2021-04-23 NOTE — Discharge Instructions (Addendum)
Follow with Primary MD [name], FNP in 7 days   Get CBC, CMP, 2 view Chest X ray -  checked next visit within 1 week by Primary MD    Activity: As tolerated with Full fall precautions use walker/cane & assistance as needed  Disposition Home    Diet: Heart Healthy Renal with 1.5 lit/day fluid restriction  Check your Weight same time everyday, if you gain over 2 pounds, or you develop in leg swelling, experience more shortness of breath or chest pain, call your Primary MD immediately. Follow Cardiac Low Salt Diet and 1.5 lit/day fluid restriction.  Special Instructions: If you have smoked or chewed Tobacco  in the last 2 yrs please stop smoking, stop any regular Alcohol  and or any Recreational drug use.  On your next visit with your primary care physician please Get Medicines reviewed and adjusted.  Please request your Prim.MD to go over all Hospital Tests and Procedure/Radiological results at the follow up, please get all Hospital records sent to your Prim MD by signing hospital release before you go home.  If you experience worsening of your admission symptoms, develop shortness of breath, life threatening emergency, suicidal or homicidal thoughts you must seek medical attention immediately by calling 911 or calling your MD immediately  if symptoms less severe.  You Must read complete instructions/literature along with all the possible adverse reactions/side effects for all the Medicines you take and that have been prescribed to you. Take any new Medicines after you have completely understood and accpet all the possible adverse reactions/side effects.      You have an appointment set up with the [facility].  Multiple studies have shown that being followed by a dedicated atrial fibrillation clinic in addition to the standard care you receive from your other physicians improves health. We believe that enrollment in the atrial fibrillation clinic will allow [place] to better  care for you.   The phone number to the [facility] is [phone]. The clinic is staffed Monday through Friday from 8:30am to 5pm.  Parking Directions: The clinic is located in the Heart and Vascular Building connected to [facility]. 1)From [address] turn on to [address] and go to the 3rd entrance  (Heart and Vascular entrance) on the right. 2)Look to the right for Heart &Vascular Parking Garage. 3)A code for the entrance is required, for October is 3333.   4)Take the elevators to the 1st floor. Registration is in the room with the glass walls at the end of the hallway.  If you have any trouble parking or locating the clinic, please don't hesitate to call [phone].

## 2021-04-24 ENCOUNTER — Telehealth: Payer: Self-pay | Admitting: Nephrology

## 2021-04-24 NOTE — Telephone Encounter (Signed)
Transition of care contact from inpatient facility  Date of discharge: 04/23/21 Date of contact: 04/24/21 Method: Phone Spoke to: Patient  Patient contacted to discuss transition of care from recent inpatient hospitalization. Patient was admitted to [facility] from 10/8-10/10 with discharge diagnosis of paroxysmal atrial fibrillation with RVR   Medication changes were reviewed.  Patient will follow up with his/her outpatient HD unit on: She went to her scheduled dialysis today 10/11.

## 2021-04-25 ENCOUNTER — Inpatient Hospital Stay: Payer: Medicare Other

## 2021-04-25 ENCOUNTER — Other Ambulatory Visit: Payer: Self-pay

## 2021-04-25 VITALS — BP 115/91 | HR 81 | Temp 97.6°F | Resp 17

## 2021-04-25 DIAGNOSIS — D469 Myelodysplastic syndrome, unspecified: Secondary | ICD-10-CM

## 2021-04-25 DIAGNOSIS — D464 Refractory anemia, unspecified: Secondary | ICD-10-CM | POA: Diagnosis not present

## 2021-04-25 DIAGNOSIS — D500 Iron deficiency anemia secondary to blood loss (chronic): Secondary | ICD-10-CM

## 2021-04-25 LAB — CBC WITH DIFFERENTIAL (CANCER CENTER ONLY)
Abs Immature Granulocytes: 0.04 10*3/uL (ref 0.00–0.07)
Basophils Absolute: 0 10*3/uL (ref 0.0–0.1)
Basophils Relative: 0 %
Eosinophils Absolute: 0.1 10*3/uL (ref 0.0–0.5)
Eosinophils Relative: 1 %
HCT: 27 % — ABNORMAL LOW (ref 36.0–46.0)
Hemoglobin: 9.1 g/dL — ABNORMAL LOW (ref 12.0–15.0)
Immature Granulocytes: 1 %
Lymphocytes Relative: 14 %
Lymphs Abs: 0.7 10*3/uL (ref 0.7–4.0)
MCH: 30.6 pg (ref 26.0–34.0)
MCHC: 33.7 g/dL (ref 30.0–36.0)
MCV: 90.9 fL (ref 80.0–100.0)
Monocytes Absolute: 0.9 10*3/uL (ref 0.1–1.0)
Monocytes Relative: 19 %
Neutro Abs: 3.1 10*3/uL (ref 1.7–7.7)
Neutrophils Relative %: 65 %
Platelet Count: 199 10*3/uL (ref 150–400)
RBC: 2.97 MIL/uL — ABNORMAL LOW (ref 3.87–5.11)
RDW: 16.5 % — ABNORMAL HIGH (ref 11.5–15.5)
WBC Count: 4.8 10*3/uL (ref 4.0–10.5)
nRBC: 0.8 % — ABNORMAL HIGH (ref 0.0–0.2)

## 2021-04-25 LAB — TYPE AND SCREEN
ABO/RH(D): A NEG
Antibody Screen: NEGATIVE

## 2021-04-25 MED ORDER — LUSPATERCEPT-AAMT 75 MG ~~LOC~~ SOLR
1.7500 mg/kg | Freq: Once | SUBCUTANEOUS | Status: AC
Start: 2021-04-25 — End: 2021-04-25
  Administered 2021-04-25: 150 mg via SUBCUTANEOUS
  Filled 2021-04-25: qty 3

## 2021-04-25 NOTE — Patient Instructions (Signed)
[facility]  Discharge Instructions: Thank you for choosing [facility] to provide your oncology and hematology care.   If you have a lab appointment with the [facility], please go directly to the [facility] and check in at the registration area.   Wear comfortable clothing and clothing appropriate for easy access to any Portacath or PICC line.   We strive to give you quality time with your provider. You may need to reschedule your appointment if you arrive late (15 or more minutes).  Arriving late affects you and other patients whose appointments are after yours.  Also, if you miss three or more appointments without notifying the office, you may be dismissed from the clinic at the provider's discretion.      For prescription refill requests, have your pharmacy contact our office and allow 72 hours for refills to be completed.    Today you received the following chemotherapy and/or immunotherapy agents: Reblozyl      To help prevent nausea and vomiting after your treatment, we encourage you to take your nausea medication as directed.  BELOW ARE SYMPTOMS THAT SHOULD BE REPORTED IMMEDIATELY: *FEVER GREATER THAN 100.4 F (38 C) OR HIGHER *CHILLS OR SWEATING *NAUSEA AND VOMITING THAT IS NOT CONTROLLED WITH YOUR NAUSEA MEDICATION *UNUSUAL SHORTNESS OF BREATH *UNUSUAL BRUISING OR BLEEDING *URINARY PROBLEMS (pain or burning when urinating, or frequent urination) *BOWEL PROBLEMS (unusual diarrhea, constipation, pain near the anus) TENDERNESS IN MOUTH AND THROAT WITH OR WITHOUT PRESENCE OF ULCERS (sore throat, sores in mouth, or a toothache) UNUSUAL RASH, SWELLING OR PAIN  UNUSUAL VAGINAL DISCHARGE OR ITCHING   Items with * indicate a potential emergency and should be followed up as soon as possible or go to the Emergency Department if any problems should occur.  Please show the CHEMOTHERAPY ALERT CARD or IMMUNOTHERAPY ALERT CARD at check-in to  the Emergency Department and triage nurse.  Should you have questions after your visit or need to cancel or reschedule your appointment, please contact [facility]  Dept: [phone]  and follow the prompts.  Office hours are 8:00 a.m. to 4:30 p.m. Monday - Friday. Please note that voicemails left after 4:00 p.m. may not be returned until the following business day.  We are closed weekends and major holidays. You have access to a nurse at all times for urgent questions. Please call the main number to the clinic Dept: [phone] and follow the prompts.   For any non-urgent questions, you may also contact your provider using MyChart. We now offer e-Visits for anyone 57 and older to request care online for non-urgent symptoms. For details visit [email or web address].   Also download the MyChart app! Go to the app store, search "MyChart", open the app, select [facility], and log in with your MyChart username and password.  Due to Covid, a mask is required upon entering the hospital/clinic. If you do not have a mask, one will be given to you upon arrival. For doctor visits, patients may have 1 support person aged 74 or older with them. For treatment visits, patients cannot have anyone with them due to current Covid guidelines and our immunocompromised population.

## 2021-04-25 NOTE — Progress Notes (Signed)
Per Dr. Alen Blew, hold blood transfusion today as patient does not meet transfusion criteria. Dr. Alen Blew advised to proceed with luspatercept-aamt.

## 2021-04-27 ENCOUNTER — Telehealth: Payer: Self-pay

## 2021-04-27 NOTE — Telephone Encounter (Signed)
Transition Care Management Follow-up Telephone Call Date of discharge and from where: 04/23/2021 How have you been since you were released from the hospital? Pt stated she feels fine. Any questions or concerns? No  Items Reviewed: Did the pt receive and understand the discharge instructions provided? Yes  Medications obtained and verified? Yes  Other? No  Any new allergies since your discharge? No  Dietary orders reviewed? Yes Do you have support at home? Yes   Home Care and Equipment/Supplies: Were home health services ordered? not applicable If so, what is the name of the agency? N/a  Has the agency set up a time to come to the patient's home? not applicable Were any new equipment or medical supplies ordered?  No What is the name of the medical supply agency? N/a Were you able to get the supplies/equipment? not applicable Do you have any questions related to the use of the equipment or supplies? No  Functional Questionnaire: (I = Independent and D = Dependent) ADLs: I  Bathing/Dressing- I  Meal Prep- I  Eating- I  Maintaining continence- I  Transferring/Ambulation- I  Managing Meds- I  Follow up appointments reviewed:  PCP Hospital f/u appt confirmed? Yes  Scheduled to see [name] on 05/07/2021  @ [facility]. [facility] f/u appt confirmed? No  Scheduled to see N/A on N/A @ N/A. Are transportation arrangements needed? No  If their condition worsens, is the pt aware to call PCP or go to the Emergency Dept.? Yes Was the patient provided with contact information for the PCP's office or ED? Yes Was to pt encouraged to call back with questions or concerns? Yes

## 2021-05-01 ENCOUNTER — Telehealth (HOSPITAL_COMMUNITY): Payer: Self-pay | Admitting: Surgery

## 2021-05-01 ENCOUNTER — Other Ambulatory Visit: Payer: Medicare Other

## 2021-05-01 NOTE — Telephone Encounter (Signed)
I called to remind Patricia Mata that she still needed to complete her ordered home sleep study.  She tells me that her first test chest probe would not "stick".  I asked her to come in to get a new test and attempt to complete the exam again.  She tells me she will come to get the device.

## 2021-05-02 ENCOUNTER — Inpatient Hospital Stay: Payer: Medicare Other

## 2021-05-02 ENCOUNTER — Ambulatory Visit (HOSPITAL_COMMUNITY): Payer: Medicare Other | Admitting: Physician Assistant

## 2021-05-02 ENCOUNTER — Ambulatory Visit: Payer: BC Managed Care – PPO | Admitting: Nurse Practitioner

## 2021-05-02 ENCOUNTER — Telehealth: Payer: Self-pay | Admitting: *Deleted

## 2021-05-02 ENCOUNTER — Telehealth: Payer: Self-pay

## 2021-05-02 ENCOUNTER — Other Ambulatory Visit: Payer: Self-pay

## 2021-05-02 DIAGNOSIS — D464 Refractory anemia, unspecified: Secondary | ICD-10-CM | POA: Diagnosis not present

## 2021-05-02 DIAGNOSIS — D500 Iron deficiency anemia secondary to blood loss (chronic): Secondary | ICD-10-CM

## 2021-05-02 DIAGNOSIS — D469 Myelodysplastic syndrome, unspecified: Secondary | ICD-10-CM

## 2021-05-02 LAB — CMP (CANCER CENTER ONLY)
ALT: 24 U/L (ref 0–44)
AST: 40 U/L (ref 15–41)
Albumin: 3.7 g/dL (ref 3.5–5.0)
Alkaline Phosphatase: 135 U/L — ABNORMAL HIGH (ref 38–126)
Anion gap: 14 (ref 5–15)
BUN: 42 mg/dL — ABNORMAL HIGH (ref 8–23)
CO2: 29 mmol/L (ref 22–32)
Calcium: 9.9 mg/dL (ref 8.9–10.3)
Chloride: 95 mmol/L — ABNORMAL LOW (ref 98–111)
Creatinine: 4.58 mg/dL (ref 0.44–1.00)
GFR, Estimated: 10 mL/min — ABNORMAL LOW (ref >60)
Glucose, Bld: 193 mg/dL — ABNORMAL HIGH (ref 70–99)
Potassium: 3.6 mmol/L (ref 3.5–5.1)
Sodium: 138 mmol/L (ref 135–145)
Total Bilirubin: 1.7 mg/dL — ABNORMAL HIGH (ref 0.3–1.2)
Total Protein: 7.4 g/dL (ref 6.5–8.1)

## 2021-05-02 LAB — CBC WITH DIFFERENTIAL (CANCER CENTER ONLY)
Abs Immature Granulocytes: 0.03 10*3/uL (ref 0.00–0.07)
Basophils Absolute: 0 10*3/uL (ref 0.0–0.1)
Basophils Relative: 0 %
Eosinophils Absolute: 0 10*3/uL (ref 0.0–0.5)
Eosinophils Relative: 1 %
HCT: 27.1 % — ABNORMAL LOW (ref 36.0–46.0)
Hemoglobin: 9.1 g/dL — ABNORMAL LOW (ref 12.0–15.0)
Immature Granulocytes: 1 %
Lymphocytes Relative: 12 %
Lymphs Abs: 0.6 10*3/uL — ABNORMAL LOW (ref 0.7–4.0)
MCH: 30.6 pg (ref 26.0–34.0)
MCHC: 33.6 g/dL (ref 30.0–36.0)
MCV: 91.2 fL (ref 80.0–100.0)
Monocytes Absolute: 1.1 10*3/uL — ABNORMAL HIGH (ref 0.1–1.0)
Monocytes Relative: 21 %
Neutro Abs: 3.4 10*3/uL (ref 1.7–7.7)
Neutrophils Relative %: 65 %
Platelet Count: 187 10*3/uL (ref 150–400)
RBC: 2.97 MIL/uL — ABNORMAL LOW (ref 3.87–5.11)
RDW: 19.5 % — ABNORMAL HIGH (ref 11.5–15.5)
WBC Count: 5.1 10*3/uL (ref 4.0–10.5)
nRBC: 0.8 % — ABNORMAL HIGH (ref 0.0–0.2)

## 2021-05-02 LAB — SAMPLE TO BLOOD BANK

## 2021-05-02 NOTE — Telephone Encounter (Signed)
CRITICAL VALUE STICKER  CRITICAL VALUE: Creatnine 4.58  RECEIVER (on-site recipient of call): Velna Ochs RN  DATE & TIME NOTIFIED: 05/02/21 @ 1051  MESSENGER (representative from lab): Delsa Sale  MD NOTIFIED: Dr. Alen Blew  TIME OF NOTIFICATION: 1109  RESPONSE:  MD aware

## 2021-05-02 NOTE — Chronic Care Management (AMB) (Signed)
Chronic Care Management Pharmacy Assistant   Name: Patricia Mata  MRN: [id number] DOB: October 31, 1955   Reason for Encounter: Disease State/ Diabetes   Recent office visits:  None  Recent consult visits:  02-19-2021 Hyman Hopes, MD (Ophthalmology). Follow up.  02-20-2021 Shelly Coss, MD. Enteroscopy and colonoscopy procedure.  02-26-2021 Evalee Jefferson, RN (Cancer center). Blood transfusion.  02-28-2021 Rafael Bihari, FNP (Heart and vascular). Sodium= 130, chloride= 91, glucose= 175, BUN= 36, creatinine= 3.68, albumin= 3.2, AST= 61, Alkaline= 129, total bilirubin= 1.7, GFR= 13. Itamar sleep study ordered.  03-05-2021 Rolene Course, RN (Cancer center). Blood transfusion.  03-13-2021 Gillian Shields, RN (Cancer center). Blood transfusion.  03-21-2021 Evalee Jefferson, RN (Cancer center). Blood transfusion.  03-26-2021 [name], MD (Oncology). Follow up  03-26-2021 Sinda Du, RN (Cancer center). Blood transfusion.  04-04-2021 [name], RN (Cancer center). Blood transfusion.  04-11-2021 Evalee Jefferson, RN (Cancer center). Blood transfusion.  04-16-2021 [name], MD (Oncology). Follow up  04-16-2021 Marcell Anger D, LPN (Cancer center). Blood transfusion.  04-25-2021 Sinda Du, RN (Cancer center). Blood transfusion.   Hospital visits:  Medication Reconciliation was completed by comparing discharge summary, patient's EMR and Pharmacy list, and upon discussion with patient.  Admitted to the hospital on 04-21-2021 due to atrial fibrillation. Discharge date was 04-23-2021 Discharged from [facility]?Medications Started at [facility] Discharge:?? None  Medication Changes at Hospital Discharge: None  Medications Discontinued at Hospital Discharge: Clonidine  Medications that remain the same after Hospital Discharge:??  -All other medications will remain the same.    Medications: Outpatient  Encounter Medications as of 05/02/2021  Medication Sig   allopurinol (ZYLOPRIM) 300 MG tablet Take 300 mg by mouth daily.   allopurinol (ZYLOPRIM) 300 MG tablet Take 1 tablet by mouth daily.   amiodarone (PACERONE) 200 MG tablet Take 1 pill twice a day for 1 week then take once daily   atorvastatin (LIPITOR) 40 MG tablet Take 1 tablet by mouth once daily   blood glucose meter kit and supplies Dispense based on patient and insurance preference. Use up to four times daily as directed. (FOR ICD-10 E10.9, E11.9).   Blood Glucose Monitoring Suppl (ONETOUCH ULTRALINK) w/Device KIT Check blood sugars twice daily E11.9   carvedilol (COREG) 6.25 MG tablet Take 1 tablet (6.25 mg total) by mouth 2 (two) times daily with a meal.   cholecalciferol (VITAMIN D3) 25 MCG (1000 UNIT) tablet Take 1,000 Units by mouth daily.   glucose blood test strip check blood sugars twice daily Dx code E11.22   hydrALAZINE (APRESOLINE) 100 MG tablet Take 1 tablet (100 mg total) by mouth 3 (three) times daily.   Insulin Glargine (BASAGLAR KWIKPEN) 100 UNIT/ML Inject 15 Units into the skin daily as needed (only if blood sugar over 150). Only if above 150   Insulin Pen Needle 29G X 5MM MISC Use as directed   Lancets (ONETOUCH DELICA PLUS [id number]) MISC check blood sugars twice daily Dx code: E11.22   latanoprost (XALATAN) 0.005 % ophthalmic solution Place 1 drop into both eyes at bedtime.    levothyroxine (SYNTHROID) 150 MCG tablet TAKE 1 TABLET BY MOUTH ONCE DAILY BEFORE BREAKFAST   lidocaine-prilocaine (EMLA) cream Apply 1 application topically daily as needed (port access).   linagliptin (TRADJENTA) 5 MG TABS tablet Take 1 tablet (5 mg total) by mouth daily.   Multiple Vitamins-Minerals (CENTRUM SILVER 50+WOMEN) TABS Take 1 tablet by mouth daily.  omeprazole (PRILOSEC) 40 MG capsule Take 1 capsule (40 mg total) by mouth 2 (two) times daily. (Patient not taking: Reported on 04/21/2021)   polyethylene glycol (MIRALAX / GLYCOLAX)  17 g packet Take 17 g by mouth daily as needed for moderate constipation.   sildenafil (REVATIO) 20 MG tablet TAKE 1 TABLET BY MOUTH THREE TIMES DAILY (Patient taking differently: Take 20 mg by mouth 3 (three) times daily.)   No facility-administered encounter medications on file as of 05/02/2021.   Recent Relevant Labs: Lab Results  Component Value Date/Time   HGBA1C 6.1 (H) 01/13/2021 11:27 PM   HGBA1C 5.2 10/25/2020 01:01 PM   MICROALBUR 80 06/22/2019 12:02 PM    Kidney Function Lab Results  Component Value Date/Time   CREATININE 4.58 (HH) 05/02/2021 09:58 AM   CREATININE 5.23 (H) 04/23/2021 01:22 AM   CREATININE 4.81 (H) 04/22/2021 01:18 PM   CREATININE 4.91 (HH) 04/04/2021 12:43 PM   CREATININE 2.24 (H) 01/27/2019 01:55 PM   CREATININE 2.48 (H) 09/08/2018 01:15 PM   CREATININE 1.4 (H) 05/26/2017 09:49 AM   CREATININE 1.7 (H) 05/09/2017 09:41 AM   GFRNONAA 10 (L) 05/02/2021 09:58 AM   GFRNONAA 22 (L) 01/27/2019 01:55 PM   GFRAA 16 (L) 09/05/2020 12:53 PM   GFRAA 20 (L) 04/07/2020 11:14 AM   GFRAA 26 (L) 01/27/2019 01:55 PM    Current antihyperglycemic regimen:  Insulin Glargine- inject 15 units as needed for BS greater than 150  Tradjenta 5 mg daily  What recent interventions/DTPs have been made to improve glycemic control:  Educated on A1c and blood sugar goals; Prevention and management of hypoglycemic episodes; -Counseled to check feet daily and get yearly eye exams -Recommended to continue current medication  Have there been any recent hospitalizations or ED visits since last visit with CPP? Yes  Patient denies hypoglycemic symptoms  Patient denies hyperglycemic symptoms  How often are you checking your blood sugar? once daily  What are your blood sugars ranging?  Fasting: 145, 97, 120, 114, 139 Before meals: None After meals: None Bedtime: None  During the week, how often does your blood glucose drop below 70? Never  Are you checking your feet  daily/regularly? Daily  Adherence Review: Is the patient currently on a STATIN medication? Yes Is the patient currently on ACE/ARB medication? No Does the patient have >5 day gap between last estimated fill dates? No  Care Gaps: AWV 12-13-2021 Shingrix overdue  Covid booster overdue last completed 04-07-2020 Yearly foot exam overdue last completed 01-08-2019 Flu vaccine overdue last completed 04-28-2020  Star Rating Drugs: Tradjenta 5 mg- Last filled 05-02-2021 30 DS [facility] Atorvastatin 40 mg- Last filled 02-08-2021 90 DS [facility] Clinical Pharmacist Assistant [phone]

## 2021-05-07 ENCOUNTER — Other Ambulatory Visit: Payer: Self-pay

## 2021-05-07 ENCOUNTER — Ambulatory Visit (HOSPITAL_COMMUNITY)
Admission: RE | Admit: 2021-05-07 | Discharge: 2021-05-07 | Disposition: A | Discharge status: Home / Self Care | Payer: Medicare Other | Source: Ambulatory Visit | Attending: Physician Assistant | Admitting: Physician Assistant

## 2021-05-07 VITALS — BP 166/50 | HR 74 | Ht 64.0 in | Wt 197.0 lb

## 2021-05-07 DIAGNOSIS — N186 End stage renal disease: Secondary | ICD-10-CM | POA: Diagnosis not present

## 2021-05-07 DIAGNOSIS — Z794 Long term (current) use of insulin: Secondary | ICD-10-CM | POA: Diagnosis not present

## 2021-05-07 DIAGNOSIS — Z79890 Hormone replacement therapy (postmenopausal): Secondary | ICD-10-CM | POA: Insufficient documentation

## 2021-05-07 DIAGNOSIS — I5032 Chronic diastolic (congestive) heart failure: Secondary | ICD-10-CM | POA: Diagnosis not present

## 2021-05-07 DIAGNOSIS — D6869 Other thrombophilia: Secondary | ICD-10-CM | POA: Diagnosis not present

## 2021-05-07 DIAGNOSIS — I779 Disorder of arteries and arterioles, unspecified: Secondary | ICD-10-CM | POA: Insufficient documentation

## 2021-05-07 DIAGNOSIS — Z79899 Other long term (current) drug therapy: Secondary | ICD-10-CM | POA: Diagnosis not present

## 2021-05-07 DIAGNOSIS — E669 Obesity, unspecified: Secondary | ICD-10-CM | POA: Diagnosis not present

## 2021-05-07 DIAGNOSIS — D469 Myelodysplastic syndrome, unspecified: Secondary | ICD-10-CM | POA: Diagnosis not present

## 2021-05-07 DIAGNOSIS — Z87891 Personal history of nicotine dependence: Secondary | ICD-10-CM | POA: Diagnosis not present

## 2021-05-07 DIAGNOSIS — Z6833 Body mass index (BMI) 33.0-33.9, adult: Secondary | ICD-10-CM | POA: Diagnosis not present

## 2021-05-07 DIAGNOSIS — E1122 Type 2 diabetes mellitus with diabetic chronic kidney disease: Secondary | ICD-10-CM | POA: Diagnosis not present

## 2021-05-07 DIAGNOSIS — Z8673 Personal history of transient ischemic attack (TIA), and cerebral infarction without residual deficits: Secondary | ICD-10-CM | POA: Diagnosis not present

## 2021-05-07 DIAGNOSIS — I080 Rheumatic disorders of both mitral and aortic valves: Secondary | ICD-10-CM | POA: Insufficient documentation

## 2021-05-07 DIAGNOSIS — Z85038 Personal history of other malignant neoplasm of large intestine: Secondary | ICD-10-CM | POA: Insufficient documentation

## 2021-05-07 DIAGNOSIS — I132 Hypertensive heart and chronic kidney disease with heart failure and with stage 5 chronic kidney disease, or end stage renal disease: Secondary | ICD-10-CM | POA: Insufficient documentation

## 2021-05-07 DIAGNOSIS — I480 Paroxysmal atrial fibrillation: Secondary | ICD-10-CM | POA: Diagnosis present

## 2021-05-07 DIAGNOSIS — Z992 Dependence on renal dialysis: Secondary | ICD-10-CM | POA: Insufficient documentation

## 2021-05-07 MED ORDER — AMIODARONE HCL 200 MG PO TABS: ORAL_TABLET | ORAL | Status: DC

## 2021-05-07 MED ORDER — AMIODARONE HCL 200 MG PO TABS: ORAL_TABLET | ORAL | 1 refills | Status: AC

## 2021-05-07 NOTE — Patient Instructions (Signed)
Return in 6 months

## 2021-05-07 NOTE — Progress Notes (Signed)
Primary Care Physician: [name], FNP Primary Cardiologist: Dr Gwenlyn Found Primary Electrophysiologist: Dr Rayann Heman Referring Physician: Dr Rayann Heman [facility]: Dr Queen [place] Patricia Mata is a 65 y.o. female with a history of pulmonary HTN, severe AS, moderate MS, DM, HTN, ESRD, chronic diastolic CHF, colon cancer s/p hemicolectomy, myelodysplastic syndrome, transfusion dependent anemia, carotid artery disease, atrial fibrillation who presents for follow up in the [facility].  Patient is not on anticoagulation for a CHADS2VASC score of 8. She was admitted 04/21/21 with AF with RVR and converted with IV amiodarone. Patient in [place] and feels well today. In hindsight, she realized that she had run out of amiodarone prior to her hospitalization.   Today, she denies symptoms of palpitations, chest pain, shortness of breath, orthopnea, PND, lower extremity edema, dizziness, presyncope, syncope, snoring, daytime somnolence, bleeding, or neurologic sequela. The patient is tolerating medications without difficulties and is otherwise without complaint today.    Atrial Fibrillation Risk Factors:  she does not have symptoms or diagnosis of sleep apnea. she does not have a history of rheumatic fever.   she has a BMI of Body mass index is 33.81 kg/m.Marland Kitchen Filed Weights   05/07/21 1326  Weight: 89.4 kg    Family History  Problem Relation Age of Onset   Hypertension Mother    Diabetes Mother    Cervical cancer Mother    Heart attack Father    Hypertension Sister    Hypertension Brother    Hypertension Sister    Hypertension Sister    Prostate cancer Brother    HIV/AIDS Brother      Atrial Fibrillation Management history:  Previous antiarrhythmic drugs: amiodarone  Previous cardioversions: none Previous ablations: none CHADS2VASC score: 8 Anticoagulation history: none   Past Medical History:  Diagnosis Date   Acute renal failure (ARF) ([place]) 06/08/2018    TTHS- [address]   Carotid artery disease ([place])    Chronic diastolic (congestive) heart failure ([place])    Colon cancer ([place]) 2008   Diabetes mellitus ([place])    Dyspnea    ESRD on hemodialysis ([place])    Gout 09/08/2018   Heart attack ([place])    Heart murmur    Hypertension    Hypothyroidism    Macrocytic anemia    MDS (myelodysplastic syndrome) (HCC)    Moderate mitral stenosis    PAD (peripheral artery disease) (HCC)    PAF (paroxysmal atrial fibrillation) (HCC)    Pulmonary hypertension (HCC)    Renal artery stenosis (HCC)    a. >60% R renal artery stenosis by duplex 2021, managed medically.   Severe aortic stenosis    Transfusion-dependent anemia    Past Surgical History:  Procedure Laterality Date   AV FISTULA PLACEMENT Right 09/26/2020   Procedure: RIGHT ARM ARTERIOVENOUS (AV) FISTULA CREATION;  Surgeon: Waynetta Sandy, MD;  Location: [place];  Service: Vascular;  Laterality: Right;  Monitor Anesthesia Care with Regional Block to Right Arm    [place] Right 12/13/2020   Procedure: RIGHT UPPER EXTREMITY ARTERIOVENOUS FISTULA TRANSPOSITION;  Surgeon: Waynetta Sandy, MD;  Location: [place];  Service: Vascular;  Laterality: Right;   COLON SURGERY     COLONOSCOPY WITH PROPOFOL N/A 02/22/2021   Procedure: COLONOSCOPY WITH PROPOFOL;  Surgeon: Otis Brace, MD;  Location: [place];  Service: Gastroenterology;  Laterality: N/A;   DEBRIDMENT OF DECUBITUS ULCER N/A 06/12/2018   Procedure: DEBRIDMENT OF DECUBITUS ULCER;  Surgeon: Wallace Going, DO;  Location:  [facility];  Service: Plastics;  Laterality: N/A;   ENTEROSCOPY N/A 02/22/2021   Procedure: ENTEROSCOPY;  Surgeon: Otis Brace, MD;  [facility]: [facility];  Service: Gastroenterology;  Laterality: N/A;   ESOPHAGOGASTRODUODENOSCOPY N/A 09/01/2020   Procedure: ESOPHAGOGASTRODUODENOSCOPY (EGD);  Surgeon: Clarene Essex, MD;  Location: [facility];  Service: Endoscopy;  Laterality: N/A;    GIVENS CAPSULE STUDY N/A 09/01/2020   Procedure: GIVENS CAPSULE STUDY;  Surgeon: Clarene Essex, MD;  Location: [facility];  Service: Endoscopy;  Laterality: N/A;   HEMOSTASIS CLIP PLACEMENT  02/22/2021   Procedure: HEMOSTASIS CLIP PLACEMENT;  Surgeon: Otis Brace, MD;  Location: [place];  Service: Gastroenterology;;   HOT HEMOSTASIS N/A 02/22/2021   Procedure: HOT HEMOSTASIS (ARGON PLASMA COAGULATION/BICAP);  Surgeon: Otis Brace, MD;  Location: [facility];  Service: Gastroenterology;  Laterality: N/A;   IR FLUORO GUIDE CV LINE RIGHT  10/11/2020   IR FLUORO GUIDE PORT INSERTION RIGHT  04/02/2017   IR REMOVAL TUN ACCESS W/ PORT W/O FL MOD SED  03/16/2019   IR US GUIDE VASC ACCESS RIGHT  04/02/2017   IR US GUIDE VASC ACCESS RIGHT  10/11/2020   LIGATION OF COMPETING BRANCHES OF ARTERIOVENOUS FISTULA Right 12/13/2020   Procedure: LIGATION OF COMPETING BRANCHES OF RIGHT UPPER EXTREMITY ARTERIOVENOUS FISTULA;  Surgeon: Waynetta Sandy, MD;  Location: [place];  Service: Vascular;  Laterality: Right;   POLYPECTOMY  02/22/2021   Procedure: POLYPECTOMY;  Surgeon: Otis Brace, MD;  Location: [place] [facility];  Service: Gastroenterology;;   RIGHT HEART CATH N/A 04/23/2019   Procedure: RIGHT HEART CATH;  Surgeon: Jolaine Artist, MD;  Location: [place] [facility];  Service: Cardiovascular;  Laterality: N/A;   RIGHT/LEFT HEART CATH AND CORONARY ANGIOGRAPHY N/A 05/21/2018   Procedure: RIGHT/LEFT HEART CATH AND CORONARY ANGIOGRAPHY;  Surgeon: Nelva Bush, MD;  Location: [place] [facility];  Service: Cardiovascular;  Laterality: N/A;   RIGHT/LEFT HEART CATH AND CORONARY ANGIOGRAPHY N/A 10/13/2020   Procedure: RIGHT/LEFT HEART CATH AND CORONARY ANGIOGRAPHY;  Surgeon: Jolaine Artist, MD;  Location: [place] [facility];  Service: Cardiovascular;  Laterality: N/A;   TUBAL LIGATION      Current Outpatient Medications  Medication Sig Dispense Refill   allopurinol (ZYLOPRIM) 300 MG tablet  Take 300 mg by mouth daily.     atorvastatin (LIPITOR) 40 MG tablet Take 1 tablet by mouth once daily 90 tablet 1   blood glucose meter kit and supplies Dispense based on patient and insurance preference. Use up to four times daily as directed. (FOR ICD-10 E10.9, E11.9). 1 each 3   Blood Glucose Monitoring Suppl (ONETOUCH ULTRALINK) w/Device KIT Check blood sugars twice daily E11.9 1 kit 0   carvedilol (COREG) 6.25 MG tablet Take 1 tablet (6.25 mg total) by mouth 2 (two) times daily with a meal. 60 tablet 0   ethyl chloride spray SMARTSIG:1 Spray(s) Topical As Directed     glucose blood test strip check blood sugars twice daily Dx code E11.22 100 each 3   hydrALAZINE (APRESOLINE) 100 MG tablet Take 1 tablet (100 mg total) by mouth 3 (three) times daily. 90 tablet 11   Insulin Glargine (BASAGLAR KWIKPEN) 100 UNIT/ML Inject 15 Units into the skin daily as needed (only if blood sugar over 150). Only if above 150     Insulin Pen Needle 29G X 5MM MISC Use as directed 200 each 0   Lancets (ONETOUCH DELICA PLUS [id number]) MISC check blood sugars twice daily Dx code: E11.22 100 each 3   latanoprost (  XALATAN) 0.005 % ophthalmic solution Place 1 drop into both eyes at bedtime.      levothyroxine (SYNTHROID) 150 MCG tablet TAKE 1 TABLET BY MOUTH ONCE DAILY BEFORE BREAKFAST 30 tablet 0   lidocaine-prilocaine (EMLA) cream Apply 1 application topically daily as needed (port access).     linagliptin (TRADJENTA) 5 MG TABS tablet Take 1 tablet (5 mg total) by mouth daily. 90 tablet 1   Methoxy PEG-Epoetin Beta (MIRCERA IJ) Mircera     Multiple Vitamins-Minerals (CENTRUM SILVER 50+WOMEN) TABS Take 1 tablet by mouth daily.     omeprazole (PRILOSEC) 40 MG capsule Take 1 capsule (40 mg total) by mouth 2 (two) times daily. 60 capsule 2   polyethylene glycol (MIRALAX / GLYCOLAX) 17 g packet Take 17 g by mouth daily as needed for moderate constipation.     sildenafil (REVATIO) 20 MG tablet TAKE 1 TABLET BY MOUTH THREE  TIMES DAILY (Patient taking differently: Take 20 mg by mouth 3 (three) times daily.) 90 tablet 4   amiodarone (PACERONE) 200 MG tablet Take 1 tablet by mouth once daily 90 tablet 1   cholecalciferol (VITAMIN D3) 25 MCG (1000 UNIT) tablet Take 1,000 Units by mouth daily. (Patient not taking: Reported on 05/07/2021)     No current facility-administered medications for this encounter.    Allergies  Allergen Reactions   Lactose Intolerance (Gi) Diarrhea   Morphine And Related Itching   Sulfa Antibiotics Rash and Other (See Comments)    Blisters, also    Social History   Socioeconomic History   Marital status: Married    Spouse name: Not on file   Number of children: Not on file   Years of education: Not on file   Highest education level: Not on file  Occupational History   Occupation: retired  Tobacco Use   Smoking status: Former    Packs/day: 0.25    Years: 20.00    Pack years: 5.00    Types: Cigarettes    Quit date: 1997    Years since quitting: 25.8   Smokeless tobacco: Former  Scientific laboratory technician Use: Never used  Substance and Sexual Activity   Alcohol use: Yes    Alcohol/week: 1.0 standard drink    Types: 1 Standard drinks or equivalent per week    Comment: occ   Drug use: Never   Sexual activity: Not on file  Other Topics Concern   Not on file  Social History Narrative   Not on file   Social Determinants of Health   Financial Resource Strain: Low Risk    Difficulty of Paying Living Expenses: Not hard at all  Food Insecurity: No Food Insecurity   Worried About Charity fundraiser in the Last Year: Never true   [place] in the Last Year: Never true  Transportation Needs: No Transportation Needs   Lack of Transportation (Medical): No   Lack of Transportation (Non-Medical): No  Physical Activity: Inactive   Days of Exercise per Week: 0 days   Minutes of Exercise per Session: 0 min  Stress: No Stress Concern Present   Feeling of Stress : Not at all   Social Connections: Not on file  Intimate Partner Violence: Not on file     ROS- All systems are reviewed and negative except as per the HPI above.  Physical Exam: Vitals:   05/07/21 1326  BP: (!) 166/50  Pulse: 74  Weight: 89.4 kg  Height: '5\' 4"'  (1.626 m)  GEN- The patient is a well appearing obese female, alert and oriented x 3 today.   Head- normocephalic, atraumatic Eyes-  Sclera clear, conjunctiva pink Ears- hearing intact Oropharynx- clear Neck- supple  Lungs- Clear to ausculation bilaterally, normal work of breathing Heart- Regular rate and rhythm, no rubs or gallops. 3/6 systolic murmur  GI- soft, NT, ND, + BS Extremities- no clubbing, cyanosis, or edema MS- no significant deformity or atrophy Skin- no rash or lesion Psych- euthymic mood, full affect Neuro- strength and sensation are intact  Wt Readings from Last 3 Encounters:  05/07/21 89.4 kg  04/23/21 89.4 kg  04/16/21 89.1 kg    EKG today demonstrates  SR, PAC Vent. rate 74 BPM PR interval 154 ms QRS duration 90 ms QT/QTcB 398/441 ms  Echo 10/09/20 demonstrated   1. Left ventricular ejection fraction, by estimation, is 50 to 55%. The left ventricle has low normal function. The left ventricle has no regional wall motion abnormalities. The left ventricular internal cavity size was mildly dilated. Left ventricular  diastolic parameters are indeterminate.   2. Right ventricular systolic function is mildly reduced. The right  ventricular size is normal. There is severely elevated pulmonary artery systolic pressure. The estimated right ventricular systolic pressure is 41.9 mmHg.   3. Left atrial size was mildly dilated.   4. Right atrial size was mildly dilated.   5. The mitral valve is degenerative. Trivial mitral valve regurgitation. Moderate mitral stenosis. The mean mitral valve gradient is 7.0 mmHg with MVA 1.5 cm^2 by VTI. Moderate to severe mitral annular calcification.   6. The aortic valve is  tricuspid. Aortic valve regurgitation is mild.  Severe aortic valve stenosis. Aortic valve area, by VTI measures 0.71 cm. Aortic valve mean gradient measures 65.0 mmHg.   7. The inferior vena cava is dilated in size with >50% respiratory  variability, suggesting right atrial pressure of 8 mmHg.   8. The patient was in atrial fibrillation.   Epic records are reviewed at length today  CHA2DS2-VASc Score = 8  The patient's score is based upon: CHF History: 1 HTN History: 1 Diabetes History: 1 Stroke History: 2 Vascular Disease History: 1 Age Score: 1 Gender Score: 1       ASSESSMENT AND PLAN: 1. Paroxysmal Atrial Fibrillation (ICD10:  I48.0) The patient's CHA2DS2-VASc score is 8, indicating a 10.8% annual risk of stroke.   Patient back in SR.  Continue amiodarone 200 mg daily Continue carvedilol 6.25 mg BID Not a good candidate for anticoagulation or Watchman.   2. Secondary Hypercoagulable State (ICD10:  D68.69) The patient is at significant risk for stroke/thromboembolism based upon her CHA2DS2-VASc Score of 8.  However, the patient is not on anticoagulation due to her high bleeding risk. Transfusion dependent anemia.   3. Obesity Body mass index is 33.81 kg/m. Lifestyle modification was discussed at length including regular exercise and weight reduction.  4. ESRD HD T/Th/Sat  5. Valvular heart disease Severe AS, moderate MS  6. HTN Stable, no changes today.   Follow up with Dr Haroldine Laws and Dr Burt Knack as scheduled. AF clinic in 6 months.    [facility] [address] [place], [place] [address] [phone] 05/07/2021 2:31 PM

## 2021-05-08 ENCOUNTER — Emergency Department (HOSPITAL_COMMUNITY)
Admission: EM | Admit: 2021-05-08 | Discharge: 2021-05-08 | Disposition: A | Discharge status: Home / Self Care | Payer: Medicare Other | Attending: Student | Admitting: Student

## 2021-05-08 ENCOUNTER — Encounter (HOSPITAL_COMMUNITY): Payer: Self-pay | Admitting: Emergency Medicine

## 2021-05-08 ENCOUNTER — Emergency Department (HOSPITAL_COMMUNITY): Payer: Medicare Other

## 2021-05-08 ENCOUNTER — Other Ambulatory Visit: Payer: Self-pay

## 2021-05-08 DIAGNOSIS — Z794 Long term (current) use of insulin: Secondary | ICD-10-CM | POA: Insufficient documentation

## 2021-05-08 DIAGNOSIS — I4891 Unspecified atrial fibrillation: Secondary | ICD-10-CM | POA: Diagnosis not present

## 2021-05-08 DIAGNOSIS — Z85038 Personal history of other malignant neoplasm of large intestine: Secondary | ICD-10-CM | POA: Diagnosis not present

## 2021-05-08 DIAGNOSIS — Z79899 Other long term (current) drug therapy: Secondary | ICD-10-CM | POA: Insufficient documentation

## 2021-05-08 DIAGNOSIS — E039 Hypothyroidism, unspecified: Secondary | ICD-10-CM | POA: Diagnosis not present

## 2021-05-08 DIAGNOSIS — Z992 Dependence on renal dialysis: Secondary | ICD-10-CM | POA: Insufficient documentation

## 2021-05-08 DIAGNOSIS — E1122 Type 2 diabetes mellitus with diabetic chronic kidney disease: Secondary | ICD-10-CM | POA: Insufficient documentation

## 2021-05-08 DIAGNOSIS — I2510 Atherosclerotic heart disease of native coronary artery without angina pectoris: Secondary | ICD-10-CM | POA: Insufficient documentation

## 2021-05-08 DIAGNOSIS — I5033 Acute on chronic diastolic (congestive) heart failure: Secondary | ICD-10-CM | POA: Diagnosis not present

## 2021-05-08 DIAGNOSIS — I132 Hypertensive heart and chronic kidney disease with heart failure and with stage 5 chronic kidney disease, or end stage renal disease: Secondary | ICD-10-CM | POA: Insufficient documentation

## 2021-05-08 DIAGNOSIS — N186 End stage renal disease: Secondary | ICD-10-CM | POA: Diagnosis not present

## 2021-05-08 DIAGNOSIS — Z8616 Personal history of COVID-19: Secondary | ICD-10-CM | POA: Diagnosis not present

## 2021-05-08 DIAGNOSIS — Z87891 Personal history of nicotine dependence: Secondary | ICD-10-CM | POA: Insufficient documentation

## 2021-05-08 DIAGNOSIS — R002 Palpitations: Secondary | ICD-10-CM | POA: Diagnosis present

## 2021-05-08 LAB — BASIC METABOLIC PANEL
Anion gap: 17 — ABNORMAL HIGH (ref 5–15)
BUN: 17 mg/dL (ref 8–23)
CO2: 27 mmol/L (ref 22–32)
Calcium: 8.5 mg/dL — ABNORMAL LOW (ref 8.9–10.3)
Chloride: 91 mmol/L — ABNORMAL LOW (ref 98–111)
Creatinine, Ser: 2.11 mg/dL — ABNORMAL HIGH (ref 0.44–1.00)
GFR, Estimated: 25 mL/min — ABNORMAL LOW (ref >60)
Glucose, Bld: 136 mg/dL — ABNORMAL HIGH (ref 70–99)
Potassium: 2.8 mmol/L — ABNORMAL LOW (ref 3.5–5.1)
Sodium: 135 mmol/L (ref 135–145)

## 2021-05-08 LAB — CBC
HCT: 25.1 % — ABNORMAL LOW (ref 36.0–46.0)
Hemoglobin: 8.6 g/dL — ABNORMAL LOW (ref 12.0–15.0)
MCH: 31.2 pg (ref 26.0–34.0)
MCHC: 34.3 g/dL (ref 30.0–36.0)
MCV: 90.9 fL (ref 80.0–100.0)
Platelets: 261 10*3/uL (ref 150–400)
RBC: 2.76 MIL/uL — ABNORMAL LOW (ref 3.87–5.11)
RDW: 20.3 % — ABNORMAL HIGH (ref 11.5–15.5)
WBC: 6.1 10*3/uL (ref 4.0–10.5)
nRBC: 1.3 % — ABNORMAL HIGH (ref 0.0–0.2)

## 2021-05-08 LAB — TROPONIN I (HIGH SENSITIVITY)
Troponin I (High Sensitivity): 54 ng/L — ABNORMAL HIGH (ref <18)
Troponin I (High Sensitivity): 52 ng/L — ABNORMAL HIGH (ref <18)

## 2021-05-08 NOTE — ED Provider Notes (Signed)
Pat[facility] EMERGENCY DEPARTMENT Provider Note   CSN: [id number] Arrival date & time: 05/08/21  1146     History Chief Complaint  Patient presents with   Atrial Fibrillation    Patricia Mata is a 65 y.o. female with PMH abnormal uterine bleeding, CHF, anemia, CAD, aortic stenosis, DM, ESRD on dialysis, myelodysplastic syndrome with need for chronic transfusions, peptic ulcer disease, A. Fib who presents the emergency department for evaluation of palpitations and tachycardia.  Patient states that she was at dialysis today when she had an episode of hypotension and subsequent palpitations.  She was found to have an irregular heart rate in the 150s and transferred to the emergency department for evaluation.  She denies chest pain, shortness of breath, Donnell pain, nausea, vomiting, diaphoresis or any other systemic symptoms.   Atrial Fibrillation Pertinent negatives include no chest pain, no abdominal pain and no shortness of breath.  Atrial Fibrillation Pertinent negatives include no chest pain, no abdominal pain and no shortness of breath.      Past Medical History:  Diagnosis Date   Acute renal failure (ARF) ([place]) 06/08/2018   TTHS- [place]   Carotid artery disease [facility])    Chronic diastolic (congestive) heart failure (HCC)    Colon cancer ([place]) 2008   Diabetes mellitus ([place])    Dyspnea    ESRD on hemodialysis ([place])    Gout 09/08/2018   Heart attack ([place])    Heart murmur    Hypertension    Hypothyroidism    Macrocytic anemia    MDS (myelodysplastic syndrome) (HCC)    Moderate mitral stenosis    PAD (peripheral artery disease) (HCC)    PAF (paroxysmal atrial fibrillation) (HCC)    Pulmonary hypertension (HCC)    Renal artery stenosis (HCC)    a. >60% R renal artery stenosis by duplex 2021, managed medically.   Severe aortic stenosis    Transfusion-dependent anemia     Patient Active Problem List   Diagnosis Date Noted   Secondary  hypercoagulable state ([place]) 05/07/2021   Atrial fibrillation with RVR ([place]) 04/21/2021   Anemia 01/13/2021   Obesity 11/23/2020   Joint pain 11/23/2020   Other long term (current) drug therapy 11/23/2020   Primary osteoarthritis 11/23/2020   ESRD (end stage renal disease) ([place]) 11/22/2020   Nonrheumatic mitral (valve) stenosis 10/17/2020   Coagulation defect, unspecified ([place]) 64/68/0321   Complication of vascular dialysis catheter 10/14/2020   Peripheral vascular disease ([place]) 10/14/2020   NSTEMI (non-ST elevated myocardial infarction) ([place]) 10/10/2020   Angina pectoris ([place]) 10/09/2020   Angina at rest ([place]) 10/09/2020   Gastroesophageal reflux disease 09/12/2020   History of malignant neoplasm of colon 09/12/2020   Chronic gastritis 09/02/2020   Symptomatic anemia 08/31/2020   Abnormal uterine bleeding (AUB) 07/19/2020   Vaginal ulceration 07/19/2020   Acute renal failure superimposed on stage 4 chronic kidney disease ([place])    Palliative care by specialist    Acute on chronic diastolic CHF (congestive heart failure) ([place]) 06/07/2020   Chronic cough 05/04/2020   Allergic rhinitis 03/23/2020   Chronic kidney disease, stage 5 ([place]) 22/48/2500   Acute diastolic CHF (congestive heart failure) ([place]) 09/21/2019   HCAP (healthcare-associated pneumonia) 09/20/2019   Pulmonary hypertension ([place]) 09/20/2019   DM2 (diabetes mellitus, type 2) ([place]) 09/20/2019   Elevated troponin 09/20/2019   Severe sepsis ([place]) 09/20/2019   CAP (community acquired pneumonia) 09/14/2019   Epistaxis, recurrent 08/10/2019   Neutropenia ([place]) 05/25/2019   Malignant neoplasm of  colon ([place]) 05/25/2019   Bradycardia 03/21/2019   Pneumonia due to COVID-19 virus 03/18/2019   Type 2 diabetes mellitus with hemoglobin A1c goal of less than 7.0% ([place]) 12/10/2018   Hypothyroidism 12/10/2018   Seasonal allergies 12/10/2018   Gout 09/08/2018   Bacterial infection due to Morganella morganii    Sacral wound  06/08/2018   Iron deficiency anemia due to chronic blood loss 06/08/2018   Hyponatremia 06/08/2018   Constipation 06/08/2018   Encounter for nasogastric (NG) tube placement    Pressure injury of skin 05/22/2018   Paroxysmal atrial fibrillation ([place]) 05/11/2018   Normocytic anemia 06/14/2017   Aortic stenosis, severe 06/10/2017   Carotid artery disease ([place]) 06/10/2017   Essential hypertension 05/13/2017   Hyperlipidemia 05/13/2017   Bilateral lower extremity edema 05/13/2017   Port-A-Cath in place 04/28/2017   MDS (myelodysplastic syndrome) ([place]) 03/20/2017   Goals of care, counseling/discussion 03/20/2017   Anemia in chronic kidney disease 05/10/2016   Anemia in stage 1 chronic kidney disease 05/10/2016   Anemia due to chronic kidney disease 02/09/2016    Past Surgical History:  Procedure Laterality Date   AV FISTULA PLACEMENT Right 09/26/2020   Procedure: RIGHT ARM ARTERIOVENOUS (AV) FISTULA CREATION;  Surgeon: Waynetta Sandy, MD;  Location: [facility];  Service: Vascular;  Laterality: Right;  Monitor Anesthesia Care with Regional Block to Right Arm    [place] Right 12/13/2020   Procedure: RIGHT UPPER EXTREMITY ARTERIOVENOUS FISTULA TRANSPOSITION;  Surgeon: Waynetta Sandy, MD;  Location: [facility];  Service: Vascular;  Laterality: Right;   COLON SURGERY     COLONOSCOPY WITH PROPOFOL N/A 02/22/2021   Procedure: COLONOSCOPY WITH PROPOFOL;  Surgeon: Otis Brace, MD;  [facility];  Service: Gastroenterology;  Laterality: N/A;   DEBRIDMENT OF DECUBITUS ULCER N/A 06/12/2018   Procedure: DEBRIDMENT OF DECUBITUS ULCER;  Surgeon: Wallace Going, DO;  Location: [facility];  Service: Plastics;  Laterality: N/A;   ENTEROSCOPY N/A 02/22/2021   Procedure: ENTEROSCOPY;  Surgeon: Otis Brace, MD;  [facility];  Service: Gastroenterology;  Laterality: N/A;   ESOPHAGOGASTRODUODENOSCOPY N/A 09/01/2020   Procedure:  ESOPHAGOGASTRODUODENOSCOPY (EGD);  Surgeon: Clarene Essex, MD;  [facility];  Service: Endoscopy;  Laterality: N/A;   GIVENS CAPSULE STUDY N/A 09/01/2020   Procedure: GIVENS CAPSULE STUDY;  Surgeon: Clarene Essex, MD;  [facility];  Service: Endoscopy;  Laterality: N/A;   HEMOSTASIS CLIP PLACEMENT  02/22/2021   Procedure: HEMOSTASIS CLIP PLACEMENT;  Surgeon: Otis Brace, MD;  Location: [facility];  Service: Gastroenterology;;   HOT HEMOSTASIS N/A 02/22/2021   Procedure: HOT HEMOSTASIS (ARGON PLASMA COAGULATION/BICAP);  Surgeon: Otis Brace, MD;  Location: [facility];  Service: Gastroenterology;  Laterality: N/A;   IR FLUORO GUIDE CV LINE RIGHT  10/11/2020   IR FLUORO GUIDE PORT INSERTION RIGHT  04/02/2017   IR REMOVAL TUN ACCESS W/ PORT W/O FL MOD SED  03/16/2019   IR US GUIDE VASC ACCESS RIGHT  04/02/2017   IR US GUIDE VASC ACCESS RIGHT  10/11/2020   LIGATION OF COMPETING BRANCHES OF ARTERIOVENOUS FISTULA Right 12/13/2020   Procedure: LIGATION OF COMPETING BRANCHES OF RIGHT UPPER EXTREMITY ARTERIOVENOUS FISTULA;  Surgeon: Waynetta Sandy, MD;  Location: [facility];  Service: Vascular;  Laterality: Right;   POLYPECTOMY  02/22/2021   Procedure: POLYPECTOMY;  Surgeon: Otis Brace, MD;  Location: [facility];  Service: Gastroenterology;;   RIGHT HEART CATH N/A 04/23/2019   Procedure: RIGHT HEART CATH;  Surgeon: Jolaine Artist, MD;  Location:  [facility];  Service: Cardiovascular;  Laterality: N/A;   RIGHT/LEFT HEART CATH AND CORONARY ANGIOGRAPHY N/A 05/21/2018   Procedure: RIGHT/LEFT HEART CATH AND CORONARY ANGIOGRAPHY;  Surgeon: Nelva Bush, MD;  Location: [facility];  Service: Cardiovascular;  Laterality: N/A;   RIGHT/LEFT HEART CATH AND CORONARY ANGIOGRAPHY N/A 10/13/2020   Procedure: RIGHT/LEFT HEART CATH AND CORONARY ANGIOGRAPHY;  Surgeon: Jolaine Artist, MD;  Location: [facility];  Service: Cardiovascular;  Laterality: N/A;    TUBAL LIGATION       OB History   No obstetric history on file.     Family History  Problem Relation Age of Onset   Hypertension Mother    Diabetes Mother    Cervical cancer Mother    Heart attack Father    Hypertension Sister    Hypertension Brother    Hypertension Sister    Hypertension Sister    Prostate cancer Brother    HIV/AIDS Brother     Social History   Tobacco Use   Smoking status: Former    Packs/day: 0.25    Years: 20.00    Pack years: 5.00    Types: Cigarettes    Quit date: 1997    Years since quitting: 25.8   Smokeless tobacco: Former  Scientific laboratory technician Use: Never used  Substance Use Topics   Alcohol use: Yes    Alcohol/week: 1.0 standard drink    Types: 1 Standard drinks or equivalent per week    Comment: occ   Drug use: Never    Home Medications Prior to Admission medications   Medication Sig Start Date End Date Taking? Authorizing Provider  allopurinol (ZYLOPRIM) 300 MG tablet Take 300 mg by mouth daily. 07/05/20   [provider]  amiodarone (PACERONE) 200 MG tablet Take 1 tablet by mouth once daily 05/07/21   Fenton, Clint R, PA  atorvastatin (LIPITOR) 40 MG tablet Take 1 tablet by mouth once daily 11/13/20   [name], FNP  blood glucose meter kit and supplies Dispense based on patient and insurance preference. Use up to four times daily as directed. (FOR ICD-10 E10.9, E11.9). 11/04/18   [name], FNP  Blood Glucose Monitoring Suppl [facility] Karsten [facility]ells) w/Device KIT Check blood sugars twice daily E11.9 11/30/19   [name], FNP  carvedilol (COREG) 6.25 MG tablet Take 1 tablet (6.25 mg total) by mouth 2 (two) times daily with a meal. 04/23/21   Thurnell Lose, MD  cholecalciferol (VITAMIN D3) 25 MCG (1000 UNIT) tablet Take 1,000 Units by mouth daily. Patient not taking: Reported on 05/07/2021    [provider]  ethyl chloride spray SMARTSIG:1 Spray(s) Topical As Directed 03/01/21   [provider]   glucose blood test strip check blood sugars twice daily Dx code E11.22 03/02/20   [name], FNP  hydrALAZINE (APRESOLINE) 100 MG tablet Take 1 tablet (100 mg total) by mouth 3 (three) times daily. 09/27/20   Lorretta Harp, MD  Insulin Glargine [facility]) 100 UNIT/ML Inject 15 Units into the skin daily as needed (only if blood sugar over 150). Only if above 150    [provider]  Insulin Pen Needle 29G X 5MM MISC Use as directed 06/16/17   Bonnielee Haff, MD  Lancets (ONETOUCH DELICA PLUS [id number]) [place] check blood sugars twice daily Dx code: E11.22 03/02/20   [name], FNP  latanoprost (XALATAN) 0.005 % ophthalmic solution Place 1 drop into both eyes at bedtime.  [provider]  levothyroxine (SYNTHROID) 150 MCG tablet TAKE 1 TABLET BY MOUTH ONCE DAILY BEFORE BREAKFAST 04/16/21   [name], FNP  lidocaine-prilocaine (EMLA) cream Apply 1 application topically daily as needed (port access). 01/23/21   [provider]  linagliptin (TRADJENTA) 5 MG TABS tablet Take 1 tablet (5 mg total) by mouth daily. 04/05/21   [name], FNP  Methoxy PEG-Epoetin Beta (MIRCERA IJ) Mircera 05/03/21 05/02/22  [provider]  Multiple Vitamins-Minerals (CENTRUM SILVER 50+WOMEN) TABS Take 1 tablet by mouth daily.    [provider]  omeprazole (PRILOSEC) 40 MG capsule Take 1 capsule (40 mg total) by mouth 2 (two) times daily. 04/16/21   [name], FNP  polyethylene glycol (MIRALAX / GLYCOLAX) 17 g packet Take 17 g by mouth daily as needed for moderate constipation. 01/14/21   Geradine Girt, DO  sildenafil (REVATIO) 20 MG tablet TAKE 1 TABLET BY MOUTH THREE TIMES DAILY Patient taking differently: Take 20 mg by mouth 3 (three) times daily. 02/05/21   Sherren Mocha, MD    Allergies    Lactose intolerance (gi), Morphine and related, and Sulfa antibiotics  Review of Systems   Review of Systems  Constitutional:  Negative for chills and fever.   HENT:  Negative for ear pain and sore throat.   Eyes:  Negative for pain and visual disturbance.  Respiratory:  Negative for cough and shortness of breath.   Cardiovascular:  Positive for palpitations. Negative for chest pain.  Gastrointestinal:  Negative for abdominal pain and vomiting.  Genitourinary:  Negative for dysuria and hematuria.  Musculoskeletal:  Negative for arthralgias and back pain.  Skin:  Negative for color change and rash.  Neurological:  Negative for seizures and syncope.  All other systems reviewed and are negative.  Physical Exam Updated Vital Signs BP (!) 174/54   Pulse 73   Temp 98.9 F (37.2 C) (Oral)   Resp 18   SpO2 100%   Physical Exam Vitals and nursing note reviewed.  Constitutional:      General: She is not in acute distress.    Appearance: She is well-developed.  HENT:     Head: Normocephalic and atraumatic.  Eyes:     Conjunctiva/sclera: Conjunctivae normal.  Cardiovascular:     Rate and Rhythm: Normal rate and regular rhythm.     Heart sounds: No murmur heard. Pulmonary:     Effort: Pulmonary effort is normal. No respiratory distress.     Breath sounds: Normal breath sounds.  Abdominal:     Palpations: Abdomen is soft.     Tenderness: There is no abdominal tenderness.  Musculoskeletal:     Cervical back: Neck supple.  Skin:    General: Skin is warm and dry.  Neurological:     Mental Status: She is alert.    ED Results / Procedures / Treatments   Labs (all labs ordered are listed, but only abnormal results are displayed) Labs Reviewed  BASIC METABOLIC PANEL - Abnormal; Notable for the following components:      Result Value   Potassium 2.8 (*)    Chloride 91 (*)    Glucose, Bld 136 (*)    Creatinine, Ser 2.11 (*)    Calcium 8.5 (*)    GFR, Estimated 25 (*)    Anion gap 17 (*)    All other components within normal limits  CBC - Abnormal; Notable for the following components:   RBC 2.76 (*)    Hemoglobin 8.6 (*)  HCT  25.1 (*)    RDW 20.3 (*)    nRBC 1.3 (*)    All other components within normal limits  TROPONIN I (HIGH SENSITIVITY) - Abnormal; Notable for the following components:   Troponin I (High Sensitivity) 54 (*)    All other components within normal limits  TROPONIN I (HIGH SENSITIVITY) - Abnormal; Notable for the following components:   Troponin I (High Sensitivity) 52 (*)    All other components within normal limits    EKG None  Radiology DG Chest 2 View  Result Date: 05/08/2021 CLINICAL DATA:  Shortness of breath EXAM: CHEST - 2 VIEW COMPARISON:  Chest x-ray dated April 21, 2021 FINDINGS: Cardiac and mediastinal contours are unchanged. Clear lungs. No evidence of pleural effusion or pneumothorax. IMPRESSION: No active cardiopulmonary disease. Electronically Signed   By: Yetta Glassman M.D.   On: 05/08/2021 13:05    Procedures Procedures   Medications Ordered in ED Medications - No data to display  ED Course  I have reviewed the triage vital signs and the nursing notes.  Pertinent labs & imaging results that were available during my care of the patient were reviewed by me and considered in my medical decision making (see chart for details).    MDM Rules/Calculators/A&P                           Patient seen in the emergency department for evaluation of tachycardia concerning for A. fib with RVR.  On initial evaluation in the emergency department after a short stay in the emergency department lobby, the patient has self converted to normal sinus rhythm.  Repeat EKG with normal sinus rhythm.  Laboratory evaluation reveals hypokalemia to 2.8, creatinine 2.11, hemoglobin 8.6, initial troponin 54, delta troponin 52.  Patient has since self converted and I am concerned that her paroxysmal A. fib may be related to dehydration during dialysis.  Patient has since converted to normal sinus rhythm and is safe for discharge at this time.  Patient then discharged. Final Clinical Impression(s)  / ED Diagnoses Final diagnoses:  Atrial fibrillation, unspecified type [facility])    Rx / DC Orders ED Discharge Orders     None        Amilia Vandenbrink, Debe Coder, MD 05/08/21 [phone]

## 2021-05-08 NOTE — ED Notes (Signed)
RN reviewed discharge instructions w/ pt. Follow up reviewed, pt had no further questions 

## 2021-05-08 NOTE — ED Triage Notes (Signed)
Pt arrives via EMS from dialysis with Afib RVR. Pt recently seen here for same and has seen a cardiologist. Denies any pain or SOB.

## 2021-05-08 NOTE — ED Provider Notes (Signed)
Emergency Medicine Provider Triage Evaluation Note  Patricia Mata , a 65 y.o. female  was evaluated in triage.  Pt was at dialysis treatment today when she had drop in her blood pressure and was noted to be in afib with RVR. Patient was recently seen here for same, most recently had cardiology appointment yesterday.  She is on amiodarone for this, no changes in her medications.  In triage she is feeling tired, but no other symptoms.  Review of Systems  Positive: Fatigue Negative: Chest pain, shortness of breath, headache, lightheadedness, leg swelling  Physical Exam  BP 108/62 (BP Location: Right Arm)   Pulse (!) 148   Temp 98.9 F (37.2 C) (Oral)   Resp 18   SpO2 99%  Gen:   Awake, no distress   Resp:  Normal effort  MSK:   Moves extremities without difficulty  Other:  Heart rate irregularly irregular, lungs clear, dialysis port in the right [facility] with palpable thrill  Medical Decision Making  Medically screening exam initiated at 12:02 PM.  Appropriate orders placed.  Jasmyn Picha was informed that the remainder of the evaluation will be completed by another provider, this initial triage assessment does not replace that evaluation, and the importance of remaining in the ED until their evaluation is complete.     Kateri Plummer, PA-C 05/08/21 1204    Lennice Sites, DO 05/08/21 1326

## 2021-05-09 ENCOUNTER — Other Ambulatory Visit: Payer: Self-pay | Admitting: *Deleted

## 2021-05-09 ENCOUNTER — Other Ambulatory Visit: Payer: Self-pay

## 2021-05-09 ENCOUNTER — Inpatient Hospital Stay: Payer: Medicare Other

## 2021-05-09 VITALS — BP 174/48 | HR 65 | Temp 98.7°F | Resp 16 | Wt 194.0 lb

## 2021-05-09 DIAGNOSIS — D500 Iron deficiency anemia secondary to blood loss (chronic): Secondary | ICD-10-CM

## 2021-05-09 DIAGNOSIS — D649 Anemia, unspecified: Secondary | ICD-10-CM

## 2021-05-09 DIAGNOSIS — D469 Myelodysplastic syndrome, unspecified: Secondary | ICD-10-CM

## 2021-05-09 DIAGNOSIS — D464 Refractory anemia, unspecified: Secondary | ICD-10-CM | POA: Diagnosis not present

## 2021-05-09 LAB — CBC WITH DIFFERENTIAL (CANCER CENTER ONLY)
Abs Immature Granulocytes: 0.04 10*3/uL (ref 0.00–0.07)
Basophils Absolute: 0 10*3/uL (ref 0.0–0.1)
Basophils Relative: 0 %
Eosinophils Absolute: 0 10*3/uL (ref 0.0–0.5)
Eosinophils Relative: 1 %
HCT: 22.2 % — ABNORMAL LOW (ref 36.0–46.0)
Hemoglobin: 7.7 g/dL — ABNORMAL LOW (ref 12.0–15.0)
Immature Granulocytes: 1 %
Lymphocytes Relative: 15 %
Lymphs Abs: 0.7 10*3/uL (ref 0.7–4.0)
MCH: 31.4 pg (ref 26.0–34.0)
MCHC: 34.7 g/dL (ref 30.0–36.0)
MCV: 90.6 fL (ref 80.0–100.0)
Monocytes Absolute: 1.1 10*3/uL — ABNORMAL HIGH (ref 0.1–1.0)
Monocytes Relative: 22 %
Neutro Abs: 3.1 10*3/uL (ref 1.7–7.7)
Neutrophils Relative %: 61 %
Platelet Count: 235 10*3/uL (ref 150–400)
RBC: 2.45 MIL/uL — ABNORMAL LOW (ref 3.87–5.11)
RDW: 20.4 % — ABNORMAL HIGH (ref 11.5–15.5)
WBC Count: 5 10*3/uL (ref 4.0–10.5)
nRBC: 1.6 % — ABNORMAL HIGH (ref 0.0–0.2)

## 2021-05-09 LAB — PREPARE RBC (CROSSMATCH)

## 2021-05-09 LAB — SAMPLE TO BLOOD BANK

## 2021-05-09 MED ORDER — DIPHENHYDRAMINE HCL 25 MG PO CAPS
25.0000 mg | ORAL_CAPSULE | Freq: Once | ORAL | Status: AC
  Administered 2021-05-09: 25 mg via ORAL

## 2021-05-09 MED ORDER — DIPHENHYDRAMINE HCL 25 MG PO CAPS
ORAL_CAPSULE | ORAL | Status: AC
  Filled 2021-05-09: qty 1

## 2021-05-09 MED ORDER — ACETAMINOPHEN 325 MG PO TABS
650.0000 mg | ORAL_TABLET | Freq: Once | ORAL | Status: AC
  Administered 2021-05-09: 650 mg via ORAL
  Filled 2021-05-09: qty 2

## 2021-05-09 MED ORDER — DIPHENHYDRAMINE HCL 25 MG PO CAPS
25.0000 mg | ORAL_CAPSULE | Freq: Once | ORAL | Status: AC
  Administered 2021-05-09: 25 mg via ORAL
  Filled 2021-05-09: qty 1

## 2021-05-09 MED ORDER — SODIUM CHLORIDE 0.9% IV SOLUTION: 250.0000 mL | Freq: Once | INTRAVENOUS | Status: DC

## 2021-05-09 NOTE — Progress Notes (Signed)
Okay to run blood at 349ml/hr per Dr Alen Blew.

## 2021-05-10 ENCOUNTER — Other Ambulatory Visit: Payer: Self-pay | Admitting: *Deleted

## 2021-05-10 LAB — BPAM RBC
Blood Product Expiration Date: 202211192359
Blood Product Expiration Date: 202211202359
ISSUE DATE / TIME: 202210261338
ISSUE DATE / TIME: 202210261338
Unit Type and Rh: 600
Unit Type and Rh: 600

## 2021-05-10 LAB — TYPE AND SCREEN
ABO/RH(D): A NEG
Antibody Screen: NEGATIVE
Unit division: 0
Unit division: 0

## 2021-05-13 ENCOUNTER — Observation Stay (HOSPITAL_COMMUNITY)
Admission: EM | Admit: 2021-05-13 | Discharge: 2021-05-15 | Disposition: A | Discharge status: Home / Self Care | Payer: Medicare Other | Attending: Internal Medicine | Admitting: Internal Medicine

## 2021-05-13 ENCOUNTER — Other Ambulatory Visit: Payer: Self-pay

## 2021-05-13 ENCOUNTER — Encounter (HOSPITAL_COMMUNITY): Payer: Self-pay | Admitting: *Deleted

## 2021-05-13 DIAGNOSIS — Z87891 Personal history of nicotine dependence: Secondary | ICD-10-CM | POA: Insufficient documentation

## 2021-05-13 DIAGNOSIS — Z85038 Personal history of other malignant neoplasm of large intestine: Secondary | ICD-10-CM | POA: Diagnosis not present

## 2021-05-13 DIAGNOSIS — I480 Paroxysmal atrial fibrillation: Secondary | ICD-10-CM | POA: Diagnosis not present

## 2021-05-13 DIAGNOSIS — R2689 Other abnormalities of gait and mobility: Secondary | ICD-10-CM | POA: Diagnosis not present

## 2021-05-13 DIAGNOSIS — D469 Myelodysplastic syndrome, unspecified: Secondary | ICD-10-CM | POA: Diagnosis present

## 2021-05-13 DIAGNOSIS — E7800 Pure hypercholesterolemia, unspecified: Secondary | ICD-10-CM | POA: Diagnosis not present

## 2021-05-13 DIAGNOSIS — Z794 Long term (current) use of insulin: Secondary | ICD-10-CM | POA: Insufficient documentation

## 2021-05-13 DIAGNOSIS — I5033 Acute on chronic diastolic (congestive) heart failure: Secondary | ICD-10-CM | POA: Diagnosis not present

## 2021-05-13 DIAGNOSIS — E119 Type 2 diabetes mellitus without complications: Secondary | ICD-10-CM

## 2021-05-13 DIAGNOSIS — E1122 Type 2 diabetes mellitus with diabetic chronic kidney disease: Secondary | ICD-10-CM

## 2021-05-13 DIAGNOSIS — I132 Hypertensive heart and chronic kidney disease with heart failure and with stage 5 chronic kidney disease, or end stage renal disease: Secondary | ICD-10-CM | POA: Diagnosis not present

## 2021-05-13 DIAGNOSIS — N186 End stage renal disease: Secondary | ICD-10-CM

## 2021-05-13 DIAGNOSIS — R54 Age-related physical debility: Secondary | ICD-10-CM | POA: Insufficient documentation

## 2021-05-13 DIAGNOSIS — E039 Hypothyroidism, unspecified: Secondary | ICD-10-CM | POA: Diagnosis not present

## 2021-05-13 DIAGNOSIS — D500 Iron deficiency anemia secondary to blood loss (chronic): Secondary | ICD-10-CM | POA: Diagnosis present

## 2021-05-13 DIAGNOSIS — E038 Other specified hypothyroidism: Secondary | ICD-10-CM

## 2021-05-13 DIAGNOSIS — K922 Gastrointestinal hemorrhage, unspecified: Secondary | ICD-10-CM

## 2021-05-13 DIAGNOSIS — E785 Hyperlipidemia, unspecified: Secondary | ICD-10-CM | POA: Diagnosis present

## 2021-05-13 DIAGNOSIS — N189 Chronic kidney disease, unspecified: Secondary | ICD-10-CM | POA: Diagnosis present

## 2021-05-13 DIAGNOSIS — D696 Thrombocytopenia, unspecified: Secondary | ICD-10-CM | POA: Diagnosis present

## 2021-05-13 DIAGNOSIS — R5381 Other malaise: Secondary | ICD-10-CM | POA: Diagnosis not present

## 2021-05-13 DIAGNOSIS — Z8616 Personal history of COVID-19: Secondary | ICD-10-CM | POA: Insufficient documentation

## 2021-05-13 DIAGNOSIS — N184 Chronic kidney disease, stage 4 (severe): Secondary | ICD-10-CM

## 2021-05-13 DIAGNOSIS — D631 Anemia in chronic kidney disease: Secondary | ICD-10-CM

## 2021-05-13 DIAGNOSIS — Z20822 Contact with and (suspected) exposure to covid-19: Secondary | ICD-10-CM | POA: Insufficient documentation

## 2021-05-13 DIAGNOSIS — Z992 Dependence on renal dialysis: Secondary | ICD-10-CM

## 2021-05-13 DIAGNOSIS — Z7984 Long term (current) use of oral hypoglycemic drugs: Secondary | ICD-10-CM | POA: Diagnosis not present

## 2021-05-13 DIAGNOSIS — D649 Anemia, unspecified: Secondary | ICD-10-CM | POA: Diagnosis present

## 2021-05-13 DIAGNOSIS — Z79899 Other long term (current) drug therapy: Secondary | ICD-10-CM | POA: Diagnosis not present

## 2021-05-13 DIAGNOSIS — I272 Pulmonary hypertension, unspecified: Secondary | ICD-10-CM | POA: Diagnosis present

## 2021-05-13 DIAGNOSIS — R195 Other fecal abnormalities: Secondary | ICD-10-CM | POA: Diagnosis present

## 2021-05-13 LAB — CBC WITH DIFFERENTIAL/PLATELET
Abs Immature Granulocytes: 0.03 10*3/uL (ref 0.00–0.07)
Basophils Absolute: 0 10*3/uL (ref 0.0–0.1)
Basophils Relative: 1 %
Eosinophils Absolute: 0 10*3/uL (ref 0.0–0.5)
Eosinophils Relative: 0 %
HCT: 23.2 % — ABNORMAL LOW (ref 36.0–46.0)
Hemoglobin: 8.1 g/dL — ABNORMAL LOW (ref 12.0–15.0)
Immature Granulocytes: 1 %
Lymphocytes Relative: 12 %
Lymphs Abs: 0.6 10*3/uL — ABNORMAL LOW (ref 0.7–4.0)
MCH: 30.5 pg (ref 26.0–34.0)
MCHC: 34.9 g/dL (ref 30.0–36.0)
MCV: 87.2 fL (ref 80.0–100.0)
Monocytes Absolute: 1 10*3/uL (ref 0.1–1.0)
Monocytes Relative: 21 %
Neutro Abs: 3.1 10*3/uL (ref 1.7–7.7)
Neutrophils Relative %: 65 %
Platelets: 58 10*3/uL — ABNORMAL LOW (ref 150–400)
RBC: 2.66 MIL/uL — ABNORMAL LOW (ref 3.87–5.11)
RDW: 21.3 % — ABNORMAL HIGH (ref 11.5–15.5)
WBC: 4.7 10*3/uL (ref 4.0–10.5)
nRBC: 1.5 % — ABNORMAL HIGH (ref 0.0–0.2)

## 2021-05-13 LAB — COMPREHENSIVE METABOLIC PANEL
ALT: 35 U/L (ref 0–44)
AST: 54 U/L — ABNORMAL HIGH (ref 15–41)
Albumin: 3.3 g/dL — ABNORMAL LOW (ref 3.5–5.0)
Alkaline Phosphatase: 104 U/L (ref 38–126)
Anion gap: 14 (ref 5–15)
BUN: 52 mg/dL — ABNORMAL HIGH (ref 8–23)
CO2: 27 mmol/L (ref 22–32)
Calcium: 9.5 mg/dL (ref 8.9–10.3)
Chloride: 95 mmol/L — ABNORMAL LOW (ref 98–111)
Creatinine, Ser: 4.13 mg/dL — ABNORMAL HIGH (ref 0.44–1.00)
GFR, Estimated: 11 mL/min — ABNORMAL LOW (ref >60)
Glucose, Bld: 165 mg/dL — ABNORMAL HIGH (ref 70–99)
Potassium: 3.7 mmol/L (ref 3.5–5.1)
Sodium: 136 mmol/L (ref 135–145)
Total Bilirubin: 1.4 mg/dL — ABNORMAL HIGH (ref 0.3–1.2)
Total Protein: 6.3 g/dL — ABNORMAL LOW (ref 6.5–8.1)

## 2021-05-13 LAB — PROTIME-INR
INR: 1.1 (ref 0.8–1.2)
Prothrombin Time: 14.2 seconds (ref 11.4–15.2)

## 2021-05-13 LAB — RETIC PANEL
Immature Retic Fract: 8.8 % (ref 2.3–15.9)
RBC.: 2.56 MIL/uL — ABNORMAL LOW (ref 3.87–5.11)
Retic Count, Absolute: 54.3 10*3/uL (ref 19.0–186.0)
Retic Ct Pct: 2.2 % (ref 0.4–3.1)
Reticulocyte Hemoglobin: 33.3 pg (ref >27.9)

## 2021-05-13 LAB — HEMOGLOBIN A1C
Hgb A1c MFr Bld: 6.5 % — ABNORMAL HIGH (ref 4.8–5.6)
Mean Plasma Glucose: 139.85 mg/dL

## 2021-05-13 LAB — RESP PANEL BY RT-PCR (FLU A&B, COVID) ARPGX2
Influenza A by PCR: NEGATIVE
Influenza B by PCR: NEGATIVE
SARS Coronavirus 2 by RT PCR: NEGATIVE

## 2021-05-13 LAB — CBG MONITORING, ED: Glucose-Capillary: 194 mg/dL — ABNORMAL HIGH (ref 70–99)

## 2021-05-13 LAB — LIPASE, BLOOD: Lipase: 27 U/L (ref 11–51)

## 2021-05-13 LAB — POC OCCULT BLOOD, ED: Fecal Occult Bld: POSITIVE — AB

## 2021-05-13 LAB — FIBRINOGEN: Fibrinogen: 244 mg/dL (ref 210–475)

## 2021-05-13 MED ORDER — FAMOTIDINE IN NACL 20-0.9 MG/50ML-% IV SOLN
20.0000 mg | Freq: Once | INTRAVENOUS | Status: AC
  Administered 2021-05-13: 20 mg via INTRAVENOUS
  Filled 2021-05-13: qty 50

## 2021-05-13 MED ORDER — ALUM & MAG HYDROXIDE-SIMETH 200-200-20 MG/5ML PO SUSP
30.0000 mL | Freq: Once | ORAL | Status: AC
  Administered 2021-05-13: 30 mL via ORAL
  Filled 2021-05-13: qty 30

## 2021-05-13 MED ORDER — LIDOCAINE VISCOUS HCL 2 % MT SOLN
15.0000 mL | Freq: Once | OROMUCOSAL | Status: AC
  Administered 2021-05-13: 15 mL via ORAL
  Filled 2021-05-13: qty 15

## 2021-05-13 MED ORDER — PANTOPRAZOLE SODIUM 40 MG IV SOLR
40.0000 mg | Freq: Two times a day (BID) | INTRAVENOUS | Status: DC
  Administered 2021-05-13 – 2021-05-15 (×4): 40 mg via INTRAVENOUS
  Filled 2021-05-13 (×4): qty 40

## 2021-05-13 MED ORDER — INSULIN ASPART 100 UNIT/ML IJ SOLN
0.0000 [IU] | INTRAMUSCULAR | Status: DC
  Administered 2021-05-13: 1 [IU] via SUBCUTANEOUS
  Administered 2021-05-14: 3 [IU] via SUBCUTANEOUS
  Administered 2021-05-14 – 2021-05-15 (×3): 1 [IU] via SUBCUTANEOUS

## 2021-05-13 NOTE — ED Notes (Signed)
Alert, NAD, calm, interactive, denies questions or needs, sx or complaints.

## 2021-05-13 NOTE — ED Notes (Signed)
EDP at BS 

## 2021-05-13 NOTE — ED Notes (Signed)
Unable to give urine sample. Still makes urine. Does not go that often. Aware of need for sample.

## 2021-05-13 NOTE — ED Triage Notes (Addendum)
BIB [facility] from home, lives with husband, reports NV at 4am, scant blood noted, also reports loose stool with scant blood/black, associated with GI upset, h/o GI problems and anemia. Alert, NAD, calm, interactive. Cbg 218, VSS. Abd pain resolved. HD yesterday, usual T, TH, S< R arm AVG, +bruit/thrill.

## 2021-05-13 NOTE — ED Notes (Signed)
Pt requesting hospital bed. This RN communicated that request to secretary per pt's request

## 2021-05-13 NOTE — ED Notes (Signed)
Updated on wait, alert, NAD, calm, interactive.

## 2021-05-13 NOTE — ED Notes (Signed)
Diet tray ordered 

## 2021-05-13 NOTE — H&P (Signed)
Continue   Adler Alton VOZ:[id number] DOB: 17-Nov-1955 DOA: 05/13/2021 PCP: [name], FNP   Outpatient Specialists:   CARDSMarlene Lard PA    Oncology Dr. Alen Blew    Patient arrived to ER on 05/13/21 at 1225 Referred by Attending Godfrey Pick, MD   Patient coming from: home Lives  With family    Chief Complaint:   Chief Complaint  Patient presents with   GI Problem    NV, loose stool, GI upset, scant blood in emesis and stool, h/o anemia    HPI: Patricia Mata is a 65 y.o. female with medical history significant of ESRD on HD TH S, DM2  a.fib abnormal uterine bleeding, chronic diastolic CHF,CAD, aortic stenosis, hypothyroidism,  MDS, Pulmonary Hypertension    Presented with  n/V since 4 AM  a bit of blood in vomit reports loose stool with scant blood/black, Was seen in the emergency department for palpitations and tachycardia on 25 October at that time her heart rate went up to 150s  Last HD was yesterday on Saturday She was seen in ER 10/25 and was noted to have hg 7.7 was transfused 2 units and DCt o home Was started on Protonix     no chest pain and no shortness of breath.   Has   been vaccinated against COVID  and boosted   Initial COVID TEST  NEGATIVE   Lab Results  Component Value Date   [place] NEGATIVE 05/13/2021   [place] NEGATIVE 04/21/2021   [place] NEGATIVE 02/20/2021   [place] NEGATIVE 01/13/2021    Regarding pertinent Chronic problems:     Hyperlipidemia -  on statins Lipitor Lipid Panel     Component Value Date/Time   CHOL 176 11/21/2020 1302   CHOL 168 08/10/2020 1601   TRIG 85 11/21/2020 1302   HDL 88 11/21/2020 1302   HDL 58 08/10/2020 1601   CHOLHDL 2.0 11/21/2020 1302   VLDL 17 11/21/2020 1302   LDLCALC 71 11/21/2020 1302   LDLCALC 92 08/10/2020 1601   LABVLDL 18 08/10/2020 1601     HTN on Coreg, Hydralazin   chronic CHF diastolic - last echo MARCH 2022 EF 50-55%    CAD  - On   statin, betablocker                       DM 2 -  Lab Results  Component Value Date   HGBA1C 6.1 (H) 01/13/2021   on insulin,     Hypothyroidism:  Lab Results  Component Value Date   TSH 0.616 04/23/2021   on synthroid       A. Fib -  - CHA2DS2 vas score  6     Not on anticoagulation secondary to Risk of Falls,          -  Rate control:  Currently controlled with  Coreg          - Rhythm control:   amiodarone,      ESRD  Lab Results  Component Value Date   CREATININE 4.13 (H) 05/13/2021   CREATININE 2.11 (H) 05/08/2021   CREATININE 4.58 (HH) 05/02/2021    Chronic anemia - baseline hg Hemoglobin & Hematocrit  Recent Labs    05/08/21 1201 05/09/21 1053 05/13/21 1253  HGB 8.6* 7.7* 8.1*    While in ER: With persistent Anemia and now thrombocytopenia     ED Triage Vitals  Enc Vitals Group     BP 05/13/21 1234 (!) 152/42  Pulse Rate 05/13/21 1234 83     Resp 05/13/21 1234 13     Temp 05/13/21 1234 98.4 F (36.9 C)     Temp src --      SpO2 05/13/21 1233 100 %     Weight 05/13/21 1236 190 lb (86.2 kg)     Height --      Head Circumference --      Peak Flow --      Pain Score 05/13/21 1236 0     Pain Loc --      Pain Edu? --      Excl. in [place]? --   TMAX(24)@     _________________________________________ Significant initial  Findings: Abnormal Labs Reviewed  COMPREHENSIVE METABOLIC PANEL - Abnormal; Notable for the following components:      Result Value   Chloride 95 (*)    Glucose, Bld 165 (*)    BUN 52 (*)    Creatinine, Ser 4.13 (*)    Total Protein 6.3 (*)    Albumin 3.3 (*)    AST 54 (*)    Total Bilirubin 1.4 (*)    GFR, Estimated 11 (*)    All other components within normal limits  CBC WITH DIFFERENTIAL/PLATELET - Abnormal; Notable for the following components:   RBC 2.66 (*)    Hemoglobin 8.1 (*)    HCT 23.2 (*)    RDW 21.3 (*)    Platelets 58 (*)    nRBC 1.5 (*)    Lymphs Abs 0.6 (*)    All other components within normal limits  POC OCCULT BLOOD, ED -  Abnormal; Notable for the following components:   Fecal Occult Bld POSITIVE (*)    All other components within normal limits   ____________________________________________ Ordered     ECG: Ordered   _____________  The recent clinical data is shown below. Vitals:   05/13/21 1715 05/13/21 1730 05/13/21 1745 05/13/21 1800  BP: (!) 172/37 (!) 174/38 (!) 174/36 (!) 170/38  Pulse: 78     Resp: (!) _0 Temp:      SpO2: 99%     Weight:        WBC     Component Value Date/Time   WBC 4.7 05/13/2021 1253   LYMPHSABS 0.6 (L) 05/13/2021 1253   LYMPHSABS 0.5 (L) 06/10/2017 1028   LYMPHSABS 0.4 (L) 05/26/2017 0949   MONOABS 1.0 05/13/2021 1253   MONOABS 0.3 05/26/2017 0949   EOSABS 0.0 05/13/2021 1253   EOSABS 0.0 06/10/2017 1028   BASOSABS 0.0 05/13/2021 1253   BASOSABS 0.0 06/10/2017 1028   BASOSABS 0.1 05/26/2017 0949    Lactic Acid, Venous    Component Value Date/Time   LATICACIDVEN 1.9 11/21/2020 1302    Results for orders placed or performed during the hospital encounter of 05/13/21  Resp Panel by RT-PCR (Flu A&B, Covid) Nasopharyngeal Swab     Status: None   Collection Time: 05/13/21  6:36 PM   Specimen: Nasopharyngeal Swab; Nasopharyngeal(NP) swabs in vial transport medium  Result Value Ref Range Status   SARS Coronavirus 2 by RT PCR NEGATIVE NEGATIVE Final         Influenza A by PCR NEGATIVE NEGATIVE Final   Influenza B by PCR NEGATIVE NEGATIVE Final         *Note: Due to a large number of results and/or encounters for the requested time period, some results have not been displayed. A complete set of results can be found in Results  Review.    _______________________________________________________ ER Provider Called:    [facility] Dr.Magod They Recommend admit to medicine  Will see in AM    _______________________________________________ Hospitalist was called for admission for  Anemia, nausea and vomiting  The following Work up has been ordered so  far:  Orders Placed This Encounter  Procedures   Resp Panel by RT-PCR (Flu A&B, Covid) Nasopharyngeal Swab   Comprehensive metabolic panel   Lipase, blood   CBC with Diff   Urinalysis, Routine w reflex microscopic   Urinalysis, Routine w reflex microscopic Urine, Clean Catch   Protime-INR   Diet clear liquid Room service appropriate? Yes; Fluid consistency: Thin   Diet NPO time specified   Initiate [place] Fluid Protocol   Pharmacy Tech to prioritize PTA Med Rec - [facility] ONLY   Consult to gastroenterology   Consult to hospitalist   POC occult blood, ED   Insert peripheral IV    Following Medications were ordered in ER: Medications  alum & mag hydroxide-simeth (MAALOX/MYLANTA) 200-200-20 MG/5ML suspension 30 mL (30 mLs Oral Given 05/13/21 1317)    And  lidocaine (XYLOCAINE) 2 % viscous mouth solution 15 mL (15 mLs Oral Given 05/13/21 1317)  famotidine (PEPCID) IVPB 20 mg premix (0 mg Intravenous Stopped 05/13/21 1453)        Consult Orders  (From admission, onward)           Start     Ordered   05/13/21 1601  Consult to hospitalist  Once       Provider:  (Not yet assigned)  Question Answer Comment  Place call to: [facility]   Reason for Consult Admit      05/13/21 1600            OTHER Significant initial  Findings:  labs showing:    Recent Labs  Lab 05/08/21 1201 05/13/21 1253  NA 135 136  K 2.8* 3.7  CO2 27 27  GLUCOSE 136* 165*  BUN 17 52*  CREATININE 2.11* 4.13*  CALCIUM 8.5* 9.5    Cr   Lab Results  Component Value Date   CREATININE 4.13 (H) 05/13/2021   CREATININE 2.11 (H) 05/08/2021   CREATININE 4.58 (HH) 05/02/2021    Recent Labs  Lab 05/13/21 1253  AST 54*  ALT 35  ALKPHOS 104  BILITOT 1.4*  PROT 6.3*  ALBUMIN 3.3*   Lab Results  Component Value Date   CALCIUM 9.5 05/13/2021   PHOS 4.6 04/22/2021    Plt: Lab Results  Component Value Date   PLT 58 (L) 05/13/2021    COVID-19 Labs  Recent Labs     05/13/21 2311 05/13/21 2315  DDIMER  --  0.67*  LDH 151  --     Lab Results  Component Value Date   SARSCOV2NAA NEGATIVE 04/21/2021   SARSCOV2NAA NEGATIVE 02/20/2021   SARSCOV2NAA NEGATIVE 01/13/2021   [place] NEGATIVE 12/12/2020     Recent Labs  Lab 05/08/21 1201 05/09/21 1053 05/13/21 1253 05/13/21 2311 05/13/21 2315  WBC 6.1 5.0 4.7 4.5  --   NEUTROABS  --  3.1 3.1  --   --   HGB 8.6* 7.7* 8.1* 7.5*  --   HCT 25.1* 22.2* 23.2* 22.2*  --   MCV 90.9 90.6 87.2 87.1  --   PLT 261 235 58* 156 163    HG/HCT   Down      Component Value Date/Time   HGB 8.1 (L) 05/13/2021 1253   HGB 7.7 (L) 05/09/2021  1053   HGB 8.0 (L) 09/05/2020 1253   HGB 5.7 (LL) 05/26/2017 0949   HCT 23.2 (L) 05/13/2021 1253   HCT 24.3 (L) 09/05/2020 1253   HCT 17.0 (L) 05/26/2017 0949   MCV 87.2 05/13/2021 1253   MCV 90 09/05/2020 1253   MCV 101.8 (H) 05/26/2017 0949    Recent Labs  Lab 05/13/21 1253  LIPASE 27    BNP (last 3 results) Recent Labs    06/07/20 0826 07/06/20 1733 11/21/20 1102  BNP 489.2* 932.6* 1,054.4*    DM  labs:  HbA1C: Recent Labs    08/10/20 1601 10/25/20 1301 01/13/21 2327  HGBA1C 6.0* 5.2 6.1*    CBG (last 3)  No results for input(s): GLUCAP in the last 72 hours.        Cultures:    Component Value Date/Time   SDES  09/24/2019 1144    TRACHEAL ASPIRATE Performed at [facility], [place] [address] [place], [place] [address]    SPECREQUEST  09/24/2019 1144    NONE Performed at [facility], [place] [address] [place],  [address]    CULT FEW CANDIDA ALBICANS 09/24/2019 1144   REPTSTATUS 09/27/2019 FINAL 09/24/2019 1144     Radiological Exams on Admission: No results found. _______________________________________________________________________________________________________ Latest  Blood pressure (!) 170/38, pulse 78, temperature 98.4 F (36.9 C), resp. rate 13, weight 86.2 kg, SpO2 99 %.    Review of Systems:    Pertinent positives include:  fatigue,  Constitutional:  No weight loss, night sweats, Fevers, chills,  weight loss  HEENT:  No headaches, Difficulty swallowing,Tooth/dental problems,Sore throat,  No sneezing, itching, ear ache, nasal congestion, post nasal drip,  Cardio-vascular:  No chest pain, Orthopnea, PND, anasarca, dizziness, palpitations.no Bilateral lower extremity swelling  GI:  No heartburn, indigestion, abdominal pain, nausea, vomiting, diarrhea, change in bowel habits, loss of appetite, melena, blood in stool, hematemesis Resp:  no shortness of breath at rest. No dyspnea on exertion, No excess mucus, no productive cough, No non-productive cough, No coughing up of blood.No change in color of mucus.No wheezing. Skin:  no rash or lesions. No jaundice GU:  no dysuria, change in color of urine, no urgency or frequency. No straining to urinate.  No flank pain.  Musculoskeletal:  No joint pain or no joint swelling. No decreased range of motion. No back pain.  Psych:  No change in mood or affect. No depression or anxiety. No memory loss.  Neuro: no localizing neurological complaints, no tingling, no weakness, no double vision, no gait abnormality, no slurred speech, no confusion  All systems reviewed and apart from [place] all are negative _______________________________________________________________________________________________ Past Medical History:   Past Medical History:  Diagnosis Date   Acute renal failure (ARF) ([place]) 06/08/2018   TTHS- [place]   Carotid artery disease ([place])    Chronic diastolic (congestive) heart failure ([place])    Colon cancer ([place]) 2008   Diabetes mellitus ([place])    Dyspnea    ESRD on hemodialysis ([place])    Gout 09/08/2018   Heart attack ([place])    Heart murmur    Hypertension    Hypothyroidism    Macrocytic anemia    MDS (myelodysplastic syndrome) (HCC)    Moderate mitral stenosis    PAD (peripheral artery  disease) (HCC)    PAF (paroxysmal atrial fibrillation) (HCC)    Pulmonary hypertension (HCC)    Renal artery stenosis (H[facility])    a. >60% R renal artery stenosis by duplex  2021, managed medically.   Severe aortic stenosis    Transfusion-dependent anemia       Past Surgical History:  Procedure Laterality Date   AV FISTULA PLACEMENT Right 09/26/2020   Procedure: RIGHT ARM ARTERIOVENOUS (AV) FISTULA CREATION;  Surgeon: Waynetta Sandy, MD;  Location: [place];  Service: Vascular;  Laterality: Right;  Monitor Anesthesia Care with Regional Block to Right Arm    [place] Right 12/13/2020   Procedure: RIGHT UPPER EXTREMITY ARTERIOVENOUS FISTULA TRANSPOSITION;  Surgeon: Waynetta Sandy, MD;  Location: [place];  Service: Vascular;  Laterality: Right;   COLON SURGERY     COLONOSCOPY WITH PROPOFOL N/A 02/22/2021   Procedure: COLONOSCOPY WITH PROPOFOL;  Surgeon: Otis Brace, MD;  Location: [place];  Service: Gastroenterology;  Laterality: N/A;   DEBRIDMENT OF DECUBITUS ULCER N/A 06/12/2018   Procedure: DEBRIDMENT OF DECUBITUS ULCER;  Surgeon: Wallace Going, DO;  [facility];  Service: Plastics;  Laterality: N/A;   ENTEROSCOPY N/A 02/22/2021   Procedure: ENTEROSCOPY;  Surgeon: Otis Brace, MD;  [facility];  Service: Gastroenterology;  Laterality: N/A;   ESOPHAGOGASTRODUODENOSCOPY N/A 09/01/2020   Procedure: ESOPHAGOGASTRODUODENOSCOPY (EGD);  Surgeon: Clarene Essex, MD;  Location[facility];  Service: Endoscopy;  Laterality: N/A;   GIVENS CAPSULE STUDY N/A 09/01/2020   Procedure: GIVENS CAPSULE STUDY;  Surgeon: Clarene Essex, MD;  [facility];  Service: Endoscopy;  Laterality: N/A;   HEMOSTASIS CLIP PLACEMENT  02/22/2021   Procedure: HEMOSTASIS CLIP PLACEMENT;  Surgeon: Otis Brace, MD;  Location: [place];  Service: Gastroenterology;;   HOT HEMOSTASIS N/A 02/22/2021   Procedure: HOT HEMOSTASIS (ARGON PLASMA  COAGULATION/BICAP);  Surgeon: Otis Brace, MD;  [facility];  Service: Gastroenterology;  Laterality: N/A;   IR FLUORO GUIDE CV LINE RIGHT  10/11/2020   IR FLUORO GUIDE PORT INSERTION RIGHT  04/02/2017   IR REMOVAL TUN ACCESS W/ PORT W/O FL MOD SED  03/16/2019   IR US GUIDE VASC ACCESS RIGHT  04/02/2017   IR US GUIDE VASC ACCESS RIGHT  10/11/2020   LIGATION OF COMPETING BRANCHES OF ARTERIOVENOUS FISTULA Right 12/13/2020   Procedure: LIGATION OF COMPETING BRANCHES OF RIGHT UPPER EXTREMITY ARTERIOVENOUS FISTULA;  Surgeon: Waynetta Sandy, MD;  Location: [place];  Service: Vascular;  Laterality: Right;   POLYPECTOMY  02/22/2021   Procedure: POLYPECTOMY;  Surgeon: Otis Brace, MD;  Location: [facility];  Service: Gastroenterology;;   RIGHT HEART CATH N/A 04/23/2019   Procedure: RIGHT HEART CATH;  Surgeon: Jolaine Artist, MD;  Location: [place] [facility];  Service: Cardiovascular;  Laterality: N/A;   RIGHT/LEFT HEART CATH AND CORONARY ANGIOGRAPHY N/A 05/21/2018   Procedure: RIGHT/LEFT HEART CATH AND CORONARY ANGIOGRAPHY;  Surgeon: Nelva Bush, MD;  Location: [place] [facility];  Service: Cardiovascular;  Laterality: N/A;   RIGHT/LEFT HEART CATH AND CORONARY ANGIOGRAPHY N/A 10/13/2020   Procedure: RIGHT/LEFT HEART CATH AND CORONARY ANGIOGRAPHY;  Surgeon: Jolaine Artist, MD;  Location: [place] [facility];  Service: Cardiovascular;  Laterality: N/A;   TUBAL LIGATION      Social History:  Ambulatory   wheelchair bound if long distance     reports that she quit smoking about 25 years ago. Her smoking use included cigarettes. She has a 5.00 pack-year smoking history. She has quit using smokeless tobacco. She reports current alcohol use of about 1.0 standard drink per week. She reports that she does not use drugs.    Family History:   Family History  Problem Relation  Age of Onset   Hypertension Mother    Diabetes Mother    Cervical cancer Mother    Heart  attack Father    Hypertension Sister    Hypertension Brother    Hypertension Sister    Hypertension Sister    Prostate cancer Brother    HIV/AIDS Brother    ______________________________________________________________________________________________ Allergies: Allergies  Allergen Reactions   Lactose Intolerance (Gi) Diarrhea   Morphine And Related Itching   Sulfa Antibiotics Rash and Other (See Comments)    Blisters, also     Prior to Admission medications   Medication Sig Start Date End Date Taking? Authorizing Provider  allopurinol (ZYLOPRIM) 300 MG tablet Take 300 mg by mouth daily. 07/05/20   [provider]  amiodarone (PACERONE) 200 MG tablet Take 1 tablet by mouth once daily 05/07/21   Fenton, Clint R, PA  atorvastatin (LIPITOR) 40 MG tablet Take 1 tablet by mouth once daily 11/13/20   [name], FNP  blood glucose meter kit and supplies Dispense based on patient and insurance preference. Use up to four times daily as directed. (FOR ICD-10 E10.9, E11.9). 11/04/18   [name], FNP  Blood Glucose Monitoring Suppl [facility]) w/Device KIT Check blood sugars twice daily E11.9 11/30/19   [name], FNP  carvedilol (COREG) 6.25 MG tablet Take 1 tablet (6.25 mg total) by mouth 2 (two) times daily with a meal. 04/23/21   Thurnell Lose, MD  cholecalciferol (VITAMIN D3) 25 MCG (1000 UNIT) tablet Take 1,000 Units by mouth daily. Patient not taking: Reported on 05/07/2021    [provider]  ethyl chloride spray SMARTSIG:1 Spray(s) Topical As Directed 03/01/21   [provider]  glucose blood test strip check blood sugars twice daily Dx code E11.22 03/02/20   [name], FNP  hydrALAZINE (APRESOLINE) 100 MG tablet Take 1 tablet (100 mg total) by mouth 3 (three) times daily. 09/27/20   Lorretta Harp, MD  Insulin Glargine [facility]) 100 UNIT/ML Inject 15 Units into the skin daily as needed (only if blood sugar over 150). Only if  above 150    [provider]  Insulin Pen Needle 29G X 5MM MISC Use as directed 06/16/17   Bonnielee Haff, MD  Lancets (ONETOUCH DELICA PLUS [id number]) [place] check blood sugars twice daily Dx code: E11.22 03/02/20   [name], FNP  latanoprost (XALATAN) 0.005 % ophthalmic solution Place 1 drop into both eyes at bedtime.     [provider]  levothyroxine (SYNTHROID) 150 MCG tablet TAKE 1 TABLET BY MOUTH ONCE DAILY BEFORE BREAKFAST 04/16/21   [name], FNP  lidocaine-prilocaine (EMLA) cream Apply 1 application topically daily as needed (port access). 01/23/21   [provider]  linagliptin (TRADJENTA) 5 MG TABS tablet Take 1 tablet (5 mg total) by mouth daily. 04/05/21   [name], FNP  Methoxy PEG-Epoetin Beta (MIRCERA IJ) Mircera 05/03/21 05/02/22  [provider]  Multiple Vitamins-Minerals (CENTRUM SILVER 50+WOMEN) TABS Take 1 tablet by mouth daily.    [provider]  omeprazole (PRILOSEC) 40 MG capsule Take 1 capsule (40 mg total) by mouth 2 (two) times daily. 04/16/21   [name], FNP  polyethylene glycol (MIRALAX / GLYCOLAX) 17 g packet Take 17 g by mouth daily as needed for moderate constipation. 01/14/21   Geradine Girt, DO  sildenafil (REVATIO) 20 MG tablet TAKE 1 TABLET BY MOUTH THREE TIMES DAILY Patient taking differently: Take 20 mg by mouth 3 (three) times daily. 02/05/21  Sherren Mocha, MD    ___________________________________________________________________________________________________ Physical Exam: Vitals with BMI 05/13/2021 05/13/2021 05/13/2021  Height - - -  Weight - - -  BMI - - -  Systolic 622 633 354  Diastolic 38 36 38  Pulse - - -     1. General:  in No  Acute distress   Chronically ill -appearing 2. Psychological: Alert and   Oriented 3. Head/ENT:    Dry Mucous Membranes                          Head Non traumatic, neck supple                           Poor Dentition 4. SKIN decreased Skin  turgor,  Skin clean Dry and intact no rash 5. Heart: Regular rate and rhythm no  Murmur, no Rub or gallop 6. Lungs: no wheezes or crackles   7. Abdomen: Soft,  non-tender, Non distended   obese  bowel sounds present 8. Lower extremities: no clubbing, cyanosis, no  edema 9. Neurologically Grossly intact, moving all 4 extremities equally  10. MSK: Normal range of motion    Chart has been reviewed  ______________________________________________________________________________________________  Assessment/Plan 65 y.o. female with medical history significant of ESRD on HD TH S, DM2  a.fib abnormal uterine bleeding, chronic diastolic CHF,CAD, aortic stenosis, hypothyroidism,  MDS, Pulmonary Hypertension   Admitted for anemia, nausea and vomiitng, thormbocytopenia  Present on Admission:  Symptomatic anemia  -need to follow hemoglobin and transfuse 1 unit given that HG continues to drift down Patient is on hemodialysis avoid over aggressive fluid load. Appreciate GI consult  Iron deficiency anemia due to chronic blood loss - transfuse 1 unit RPBC   Anemia due to chronic kidney disease -anemia panel in a.m. May need to also discussed with nephrology different options   Hyperlipidemia -resume home medication    MDS (myelodysplastic syndrome) [facility]) appreciate hematology consult   Paroxysmal atrial fibrillation ([place]) not on anticoagulation given chronic anemia   Pulmonary hypertension ([place]) -chronic stable resume home medication   Hypothyroidism -- Check TSH continue home medications at current dose   Thrombocytopenia [facility]) -hematology has been consulted and initial orders for DIC and smear was ordered.  Repeat CBC showing now platelets up to 156 and 160 suggesting that platelets of 50s were Possibly erroneous reading Continue to monitor  ESRD - oh HD nephrology was made aware Other plan as per orders.  DVT prophylaxis:  SCD    Code Status:    Code Status: Prior FULL CODE   as per  patient   I had personally discussed CODE STATUS with patient   Family Communication:   Family not at  Bedside    Disposition Plan:     To home once workup is complete and patient is stable   Following barriers for discharge:                            Electrolytes corrected                               Anemia corrected/ stable  Will need to be able to tolerate PO                            Will likely need home health, home O2, set up                           Will need consultants to evaluate patient prior to discharge    Would benefit from PT/OT eval prior to DC  Ordered                                      Consults called: sent msg to [facility], discussed with hematology Dr. Georgian Co GI Dr. Watt Climes  Admission status:  ED Disposition     ED Disposition  [place]: [place] [[id number]]  Level of Care: Telemetry Medical [[id number]]  May place patient in observation at [facility] or [place] if equivalent level of care is available:: No  Covid Evaluation: Asymptomatic Screening Protocol (No Symptoms)  Diagnosis: Symptomatic anemia [[id number]]  Admitting Physician: Toy Baker [[id number]]  Attending Physician: Toy Baker [[id number]]          Obs    Level of care     tele  For 12H     Lab Results  Component Value Date   [place] 05/13/2021     Precautions: admitted as   Covid Negative     PPE: Used by the provider:   N95  eye Goggles,  Gloves     Anastassia Doutova 05/14/2021, 2:01 AM    [facility]     after 2 AM please page floor coverage PA If 7AM-7PM, please contact the day team taking care of the patient using [email or web address]   Patient was evaluated in the context of the global COVID-19 pandemic, which necessitated consideration that the patient might be at risk for infection with the SARS-CoV-2 virus that causes COVID-19.  Institutional protocols and algorithms that pertain to the evaluation of patients at risk for COVID-19 are in a state of rapid change based on information released by regulatory bodies including the CDC and federal and state organizations. These policies and algorithms were followed during the patient's care.

## 2021-05-13 NOTE — ED Provider Notes (Signed)
Admitted to dr Cliffdell Nation hospitalist.  Pt remains HD stable at this time - we will hold off on emergent transfusion, given her ESRD, and trend her hgb levels.  Clear liquid diet for now, can be NPO at midnight pending GI evaluation.   Patricia Dusky, MD 05/13/21 Bosie Helper

## 2021-05-13 NOTE — ED Provider Notes (Signed)
[facility] EMERGENCY DEPARTMENT Provider Note   CSN: [id number] Arrival date & time: 05/13/21  1225     History Chief Complaint  Patient presents with   GI Problem    NV, loose stool, GI upset, scant blood in emesis and stool, h/o anemia    Patricia Mata is a 65 y.o. female.   GI Problem Associated symptoms include abdominal pain. Pertinent negatives include no chest pain and no shortness of breath. Patient presents for abdominal discomfort, nausea, hematemesis, and melanotic stools.  Onset of the symptoms was today.  It started with nausea and vomiting at 4 AM.  She had an episode of vomiting and diarrhea at that time.  She subsequently felt better but had recurrence approximately 2 hours prior to arrival.  Patient has had upper GI bleeding in the past.  In August, she underwent colonoscopy and upper endoscopy.  She was found to have lesions in her stomach and duodenum which were ablated.  She is currently not on any blood thinning medications.  She is anemic at baseline with hemoglobin in the range of 8-9.  She also has ESRD and gets HD on T, TH, and SA.  She did get a full session of dialysis yesterday.  Medical history is also notable for MDS, for which she is followed at the cancer center.  On Wednesday (5 days ago) her hemoglobin was found to be 7.7.  She did get 2 units of PRBCs transfused.  She has been prescribed Protonix but is not sure that she is taking this medication.  Currently, she endorses mild epigastric discomfort.  She denies any other symptoms, including nausea, at this time.     Past Medical History:  Diagnosis Date   Acute renal failure (ARF) ([place]) 06/08/2018   TTHS- [place]   Carotid artery disease [facility])    Chronic diastolic (congestive) heart failure (HCC)    Colon cancer ([place]) 2008   Diabetes mellitus ([place])    Dyspnea    ESRD on hemodialysis ([place])    Gout 09/08/2018   Heart attack ([place])    Heart murmur    Hypertension     Hypothyroidism    Macrocytic anemia    MDS (myelodysplastic syndrome) (HCC)    Moderate mitral stenosis    PAD (peripheral artery disease) (HCC)    PAF (paroxysmal atrial fibrillation) (HCC)    Pulmonary hypertension (HCC)    Renal artery stenosis (HCC)    a. >60% R renal artery stenosis by duplex 2021, managed medically.   Severe aortic stenosis    Transfusion-dependent anemia     Patient Active Problem List   Diagnosis Date Noted   Secondary hypercoagulable state ([place]) 05/07/2021   Atrial fibrillation with RVR ([place]) 04/21/2021   Anemia 01/13/2021   Obesity 11/23/2020   Joint pain 11/23/2020   Other long term (current) drug therapy 11/23/2020   Primary osteoarthritis 11/23/2020   ESRD (end stage renal disease) ([place]) 11/22/2020   Nonrheumatic mitral (valve) stenosis 10/17/2020   Coagulation defect, unspecified ([place]) 22/29/7989   Complication of vascular dialysis catheter 10/14/2020   Peripheral vascular disease ([place]) 10/14/2020   NSTEMI (non-ST elevated myocardial infarction) ([place]) 10/10/2020   Angina pectoris ([place]) 10/09/2020   Angina at rest [facility]) 10/09/2020   Gastroesophageal reflux disease 09/12/2020   History of malignant neoplasm of colon 09/12/2020   Chronic gastritis 09/02/2020   Symptomatic anemia 08/31/2020   Abnormal uterine bleeding (AUB) 07/19/2020   Vaginal ulceration 07/19/2020   Acute renal failure  superimposed on stage 4 chronic kidney disease ([place])    Palliative care by specialist    Acute on chronic diastolic CHF (congestive heart failure) ([place]) 06/07/2020   Chronic cough 05/04/2020   Allergic rhinitis 03/23/2020   Chronic kidney disease, stage 5 ([place]) 97/08/6376   Acute diastolic CHF (congestive heart failure) ([place]) 09/21/2019   HCAP (healthcare-associated pneumonia) 09/20/2019   Pulmonary hypertension ([place]) 09/20/2019   DM2 (diabetes mellitus, type 2) ([place]) 09/20/2019   Elevated troponin 09/20/2019   Severe sepsis ([place]) 09/20/2019   CAP (community  acquired pneumonia) 09/14/2019   Epistaxis, recurrent 08/10/2019   Neutropenia ([place]) 05/25/2019   Malignant neoplasm of colon ([place]) 05/25/2019   Bradycardia 03/21/2019   Pneumonia due to COVID-19 virus 03/18/2019   Type 2 diabetes mellitus with hemoglobin A1c goal of less than 7.0% ([place]) 12/10/2018   Hypothyroidism 12/10/2018   Seasonal allergies 12/10/2018   Gout 09/08/2018   Bacterial infection due to Morganella morganii    Sacral wound 06/08/2018   Iron deficiency anemia due to chronic blood loss 06/08/2018   Hyponatremia 06/08/2018   Constipation 06/08/2018   Encounter for nasogastric (NG) tube placement    Pressure injury of skin 05/22/2018   Paroxysmal atrial fibrillation ([place]) 05/11/2018   Normocytic anemia 06/14/2017   Aortic stenosis, severe 06/10/2017   Carotid artery disease ([place]) 06/10/2017   Essential hypertension 05/13/2017   Hyperlipidemia 05/13/2017   Bilateral lower extremity edema 05/13/2017   Port-A-Cath in place 04/28/2017   MDS (myelodysplastic syndrome) ([place]) 03/20/2017   Goals of care, counseling/discussion 03/20/2017   Anemia in chronic kidney disease 05/10/2016   Anemia in stage 1 chronic kidney disease 05/10/2016   Anemia due to chronic kidney disease 02/09/2016    Past Surgical History:  Procedure Laterality Date   AV FISTULA PLACEMENT Right 09/26/2020   Procedure: RIGHT ARM ARTERIOVENOUS (AV) FISTULA CREATION;  Surgeon: Waynetta Sandy, MD;  Location: [facility];  Service: Vascular;  Laterality: Right;  Monitor Anesthesia Care with Regional Block to Right Arm    [place] Right 12/13/2020   Procedure: RIGHT UPPER EXTREMITY ARTERIOVENOUS FISTULA TRANSPOSITION;  Surgeon: Waynetta Sandy, MD;  Location: [facility];  Service: Vascular;  Laterality: Right;   COLON SURGERY     COLONOSCOPY WITH PROPOFOL N/A 02/22/2021   Procedure: COLONOSCOPY WITH PROPOFOL;  Surgeon: Otis Brace, MD;  [facility]: [facility];  Service:  Gastroenterology;  Laterality: N/A;   DEBRIDMENT OF DECUBITUS ULCER N/A 06/12/2018   Procedure: DEBRIDMENT OF DECUBITUS ULCER;  Surgeon: Wallace Going, DO;  Location: [facility];  Service: Plastics;  Laterality: N/A;   ENTEROSCOPY N/A 02/22/2021   Procedure: ENTEROSCOPY;  Surgeon: Otis Brace, MD;  Location: [facility];  Service: Gastroenterology;  Laterality: N/A;   ESOPHAGOGASTRODUODENOSCOPY N/A 09/01/2020   Procedure: ESOPHAGOGASTRODUODENOSCOPY (EGD);  Surgeon: Clarene [facility], MD;  [facility];  Service: Endoscopy;  Laterality: N/A;   GIVENS CAPSULE STUDY N/A 09/01/2020   Procedure: GIVENS CAPSULE STUDY;  Surgeon: Clarene [facility], MD;  Location[facility];  Service: Endoscopy;  Laterality: N/A;   HEMOSTASIS CLIP PLACEMENT  02/22/2021   Procedure: HEMOSTASIS CLIP PLACEMENT;  Surgeon: Otis Brace, MD;  Location: [facility];  Service: Gastroenterology;;   HOT HEMOSTASIS N/A 02/22/2021   Procedure: HOT HEMOSTASIS (ARGON PLASMA COAGULATION/BICAP);  Surgeon: Otis Brace, MD;  Location: [facility];  Service: Gastroenterology;  Laterality: N/A;   IR FLUORO GUIDE CV LINE RIGHT  10/11/2020   IR FLUORO GUIDE PORT INSERTION RIGHT  04/02/2017   IR REMOVAL TUN  ACCESS W/ PORT W/O FL MOD SED  03/16/2019   IR US GUIDE VASC ACCESS RIGHT  04/02/2017   IR US GUIDE VASC ACCESS RIGHT  10/11/2020   LIGATION OF COMPETING BRANCHES OF ARTERIOVENOUS FISTULA Right 12/13/2020   Procedure: LIGATION OF COMPETING BRANCHES OF RIGHT UPPER EXTREMITY ARTERIOVENOUS FISTULA;  Surgeon: Waynetta Sandy, MD;  Location: [facility];  Service: Vascular;  Laterality: Right;   POLYPECTOMY  02/22/2021   Procedure: POLYPECTOMY;  Surgeon: Otis Brace, MD;  Location: [facility];  Service: Gastroenterology;;   RIGHT HEART CATH N/A 04/23/2019   Procedure: RIGHT HEART CATH;  Surgeon: Jolaine Artist, MD;  Location: [facility];  Service: Cardiovascular;  Laterality: N/A;   RIGHT/LEFT HEART CATH AND  CORONARY ANGIOGRAPHY N/A 05/21/2018   Procedure: RIGHT/LEFT HEART CATH AND CORONARY ANGIOGRAPHY;  Surgeon: Nelva Bush, MD;  Location: [facility];  Service: Cardiovascular;  Laterality: N/A;   RIGHT/LEFT HEART CATH AND CORONARY ANGIOGRAPHY N/A 10/13/2020   Procedure: RIGHT/LEFT HEART CATH AND CORONARY ANGIOGRAPHY;  Surgeon: Jolaine Artist, MD;  Location: [facility];  Service: Cardiovascular;  Laterality: N/A;   TUBAL LIGATION       OB History   No obstetric history on file.     Family History  Problem Relation Age of Onset   Hypertension Mother    Diabetes Mother    Cervical cancer Mother    Heart attack Father    Hypertension Sister    Hypertension Brother    Hypertension Sister    Hypertension Sister    Prostate cancer Brother    HIV/AIDS Brother     Social History   Tobacco Use   Smoking status: Former    Packs/day: 0.25    Years: 20.00    Pack years: 5.00    Types: Cigarettes    Quit date: 1997    Years since quitting: 25.8   Smokeless tobacco: Former  Scientific laboratory technician Use: Never used  Substance Use Topics   Alcohol use: Yes    Alcohol/week: 1.0 standard drink    Types: 1 Standard drinks or equivalent per week    Comment: occ   Drug use: Never    Home Medications Prior to Admission medications   Medication Sig Start Date End Date Taking? Authorizing Provider  allopurinol (ZYLOPRIM) 300 MG tablet Take 300 mg by mouth daily. 07/05/20   [provider]  amiodarone (PACERONE) 200 MG tablet Take 1 tablet by mouth once daily 05/07/21   Fenton, Clint R, PA  atorvastatin (LIPITOR) 40 MG tablet Take 1 tablet by mouth once daily 11/13/20   [name], FNP  blood glucose meter kit and supplies Dispense based on patient and insurance preference. Use up to four times daily as directed. (FOR ICD-10 E10.9, E11.9). 11/04/18   [name], FNP  Blood Glucose Monitoring Suppl [facility] [place]) w/Device KIT Check blood sugars twice daily  E11.9 11/30/19   [name], FNP  carvedilol (COREG) 6.25 MG tablet Take 1 tablet (6.25 mg total) by mouth 2 (two) times daily with a meal. 04/23/21   Thurnell Lose, MD  cholecalciferol (VITAMIN D3) 25 MCG (1000 UNIT) tablet Take 1,000 Units by mouth daily. Patient not taking: Reported on 05/07/2021    [provider]  ethyl chloride spray SMARTSIG:1 Spray(s) Topical As Directed 03/01/21   [provider]  glucose blood test strip check blood sugars twice daily Dx code E11.22 03/02/20   [name], FNP  hydrALAZINE (  APRESOLINE) 100 MG tablet Take 1 tablet (100 mg total) by mouth 3 (three) times daily. 09/27/20   Lorretta Harp, MD  Insulin Glargine [facility]) 100 UNIT/ML Inject 15 Units into the skin daily as needed (only if blood sugar over 150). Only if above 150    [provider]  Insulin Pen Needle 29G X 5MM MISC Use as directed 06/16/17   Bonnielee Haff, MD  Lancets (ONETOUCH DELICA PLUS [id number]) [place] check blood sugars twice daily Dx code: E11.22 03/02/20   [name], FNP  latanoprost (XALATAN) 0.005 % ophthalmic solution Place 1 drop into both eyes at bedtime.     [provider]  levothyroxine (SYNTHROID) 150 MCG tablet TAKE 1 TABLET BY MOUTH ONCE DAILY BEFORE BREAKFAST 04/16/21   [name], FNP  lidocaine-prilocaine (EMLA) cream Apply 1 application topically daily as needed (port access). 01/23/21   [provider]  linagliptin (TRADJENTA) 5 MG TABS tablet Take 1 tablet (5 mg total) by mouth daily. 04/05/21   [name], FNP  Methoxy PEG-Epoetin Beta (MIRCERA IJ) Mircera 05/03/21 05/02/22  [provider]  Multiple Vitamins-Minerals (CENTRUM SILVER 50+WOMEN) TABS Take 1 tablet by mouth daily.    [provider]  omeprazole (PRILOSEC) 40 MG capsule Take 1 capsule (40 mg total) by mouth 2 (two) times daily. 04/16/21   [name], FNP  polyethylene glycol (MIRALAX / GLYCOLAX) 17 g packet Take 17 g by  mouth daily as needed for moderate constipation. 01/14/21   Geradine Girt, DO  sildenafil (REVATIO) 20 MG tablet TAKE 1 TABLET BY MOUTH THREE TIMES DAILY Patient taking differently: Take 20 mg by mouth 3 (three) times daily. 02/05/21   Sherren Mocha, MD    Allergies    Lactose intolerance (gi), Morphine and related, and Sulfa antibiotics  Review of Systems   Review of Systems  Constitutional:  Negative for activity change, chills, fatigue and fever.  HENT:  Negative for congestion, ear pain and sore throat.   Eyes:  Negative for pain and visual disturbance.  Respiratory:  Negative for cough, chest tightness and shortness of breath.   Cardiovascular:  Negative for chest pain, palpitations and leg swelling.  Gastrointestinal:  Positive for abdominal pain, blood in stool, diarrhea, nausea and vomiting. Negative for abdominal distention.  Genitourinary:  Negative for dysuria, flank pain, hematuria and pelvic pain.  Musculoskeletal:  Negative for arthralgias, back pain, myalgias and neck pain.  Skin:  Negative for color change and rash.  Neurological:  Negative for dizziness, seizures, syncope, weakness, light-headedness and numbness.  Hematological:  Does not bruise/bleed easily.  All other systems reviewed and are negative.  Physical Exam Updated Vital Signs BP (!) 170/38   Pulse 78   Temp 98.4 F (36.9 C)   Resp 13   Wt 86.2 kg   SpO2 99%   BMI 32.61 kg/m   Physical Exam Vitals and nursing note reviewed.  Constitutional:      General: She is not in acute distress.    Appearance: Normal appearance. She is well-developed. She is not ill-appearing, toxic-appearing or diaphoretic.  HENT:     Head: Normocephalic and atraumatic.     Right Ear: External ear normal.     Left Ear: External ear normal.     Nose: Nose normal.     Mouth/Throat:     Mouth: Mucous membranes are moist.     Pharynx: Oropharynx is clear.  Eyes:     General: No scleral icterus.    Extraocular  Movements: Extraocular movements intact.     Conjunctiva/sclera: Conjunctivae normal.  Cardiovascular:     Rate and Rhythm: Normal rate and regular rhythm.     Heart sounds: No murmur heard. Pulmonary:     Effort: Pulmonary effort is normal. No respiratory distress.     Breath sounds: Normal breath sounds. No wheezing or rales.  Abdominal:     Palpations: Abdomen is soft.     Tenderness: There is abdominal tenderness.  Genitourinary:    Rectum: Guaiac result positive (Melanotic stool on DRE).  Musculoskeletal:        General: Normal range of motion.     Cervical back: Normal range of motion and neck supple. No rigidity.     Right lower leg: No edema.     Left lower leg: No edema.  Skin:    General: Skin is warm and dry.     Coloration: Skin is not jaundiced or pale.  Neurological:     General: No focal deficit present.     Mental Status: She is alert and oriented to person, place, and time.     Cranial Nerves: No cranial nerve deficit.     Sensory: No sensory deficit.     Motor: No weakness.  Psychiatric:        Mood and Affect: Mood normal.        Behavior: Behavior normal.        Thought Content: Thought content normal.        Judgment: Judgment normal.    ED Results / Procedures / Treatments   Labs (all labs ordered are listed, but only abnormal results are displayed) Labs Reviewed  COMPREHENSIVE METABOLIC PANEL - Abnormal; Notable for the following components:      Result Value   Chloride 95 (*)    Glucose, Bld 165 (*)    BUN 52 (*)    Creatinine, Ser 4.13 (*)    Total Protein 6.3 (*)    Albumin 3.3 (*)    AST 54 (*)    Total Bilirubin 1.4 (*)    GFR, Estimated 11 (*)    All other components within normal limits  CBC WITH DIFFERENTIAL/PLATELET - Abnormal; Notable for the following components:   RBC 2.66 (*)    Hemoglobin 8.1 (*)    HCT 23.2 (*)    RDW 21.3 (*)    Platelets 58 (*)    nRBC 1.5 (*)    Lymphs Abs 0.6 (*)    All other components within normal  limits  POC OCCULT BLOOD, ED - Abnormal; Notable for the following components:   Fecal Occult Bld POSITIVE (*)    All other components within normal limits  LIPASE, BLOOD  PROTIME-INR  URINALYSIS, ROUTINE W REFLEX MICROSCOPIC  URINALYSIS, ROUTINE W REFLEX MICROSCOPIC    EKG None  Radiology No results found.  Procedures Procedures   Medications Ordered in ED Medications  alum & mag hydroxide-simeth (MAALOX/MYLANTA) 200-200-20 MG/5ML suspension 30 mL (30 mLs Oral Given 05/13/21 1317)    And  lidocaine (XYLOCAINE) 2 % viscous mouth solution 15 mL (15 mLs Oral Given 05/13/21 1317)  famotidine (PEPCID) IVPB 20 mg premix (0 mg Intravenous Stopped 05/13/21 1453)    ED Course  I have reviewed the triage vital signs and the nursing notes.  Pertinent labs & imaging results that were available during my care of the patient were reviewed by me and considered in my medical decision making (see chart for details).    MDM Rules/Calculators/A&P  Patient is a 65 year old female presenting for onset of hematemesis and melanotic stool today.  She does have history of upper GI bleed and did undergo upper endoscopy in August.  On arrival, vital signs are notable for elevated systolic blood pressure with widened pulse pressure concerning for acute anemia.  She is well-appearing on exam.  She has mild epigastric discomfort and tenderness is present to deep palpation.  On DRE, melanotic stool is present.  Patient was given GI cocktail.  On reassessment, patient reports resolved epigastric discomfort.  Lab work showed a hemoglobin of 8.1.  Patient reports that 5 days ago, hemoglobin was 7.7 and she underwent a transfusion of 2 units of PRBCs.  Given this recent transfusion, patient's hemoglobin would be expected to be in the range of 9.5.  Given that she has significantly below this level, there is concern of clinically significant GI bleed.  Shared decision-making was had with the  patient who is agreeable to admission.  Hospitalist was consulted.  Prior to speaking with them, care of patient was signed out to oncoming ED provider. Final Clinical Impression(s) / ED Diagnoses Final diagnoses:  Upper GI bleed    Rx / DC Orders ED Discharge Orders     None        Godfrey Pick, MD 05/13/21 1815

## 2021-05-13 NOTE — ED Notes (Signed)
Alert, NAD, calm, interactive.

## 2021-05-14 ENCOUNTER — Ambulatory Visit: Payer: BC Managed Care – PPO | Admitting: Nurse Practitioner

## 2021-05-14 DIAGNOSIS — I132 Hypertensive heart and chronic kidney disease with heart failure and with stage 5 chronic kidney disease, or end stage renal disease: Secondary | ICD-10-CM | POA: Diagnosis not present

## 2021-05-14 DIAGNOSIS — N186 End stage renal disease: Secondary | ICD-10-CM | POA: Diagnosis not present

## 2021-05-14 DIAGNOSIS — E038 Other specified hypothyroidism: Secondary | ICD-10-CM | POA: Diagnosis not present

## 2021-05-14 DIAGNOSIS — E1122 Type 2 diabetes mellitus with diabetic chronic kidney disease: Secondary | ICD-10-CM | POA: Diagnosis not present

## 2021-05-14 DIAGNOSIS — E7800 Pure hypercholesterolemia, unspecified: Secondary | ICD-10-CM | POA: Diagnosis not present

## 2021-05-14 LAB — URINALYSIS, ROUTINE W REFLEX MICROSCOPIC
Bilirubin Urine: NEGATIVE
Glucose, UA: NEGATIVE mg/dL
Hgb urine dipstick: NEGATIVE
Ketones, ur: NEGATIVE mg/dL
Nitrite: NEGATIVE
Protein, ur: 30 mg/dL — AB
Specific Gravity, Urine: 1.018 (ref 1.005–1.030)
WBC, UA: >50 WBC/hpf — ABNORMAL HIGH (ref 0–5)
pH: 5 (ref 5.0–8.0)

## 2021-05-14 LAB — CBC
HCT: 22.2 % — ABNORMAL LOW (ref 36.0–46.0)
Hemoglobin: 7.5 g/dL — ABNORMAL LOW (ref 12.0–15.0)
MCH: 29.4 pg (ref 26.0–34.0)
MCHC: 33.8 g/dL (ref 30.0–36.0)
MCV: 87.1 fL (ref 80.0–100.0)
Platelets: 156 10*3/uL (ref 150–400)
RBC: 2.55 MIL/uL — ABNORMAL LOW (ref 3.87–5.11)
RDW: 21.7 % — ABNORMAL HIGH (ref 11.5–15.5)
WBC: 4.5 10*3/uL (ref 4.0–10.5)
nRBC: 0.7 % — ABNORMAL HIGH (ref 0.0–0.2)

## 2021-05-14 LAB — COMPREHENSIVE METABOLIC PANEL
ALT: 30 U/L (ref 0–44)
AST: 45 U/L — ABNORMAL HIGH (ref 15–41)
Albumin: 3.1 g/dL — ABNORMAL LOW (ref 3.5–5.0)
Alkaline Phosphatase: 102 U/L (ref 38–126)
Anion gap: 11 (ref 5–15)
BUN: 61 mg/dL — ABNORMAL HIGH (ref 8–23)
CO2: 31 mmol/L (ref 22–32)
Calcium: 9.7 mg/dL (ref 8.9–10.3)
Chloride: 95 mmol/L — ABNORMAL LOW (ref 98–111)
Creatinine, Ser: 4.57 mg/dL — ABNORMAL HIGH (ref 0.44–1.00)
GFR, Estimated: 10 mL/min — ABNORMAL LOW (ref >60)
Glucose, Bld: 97 mg/dL (ref 70–99)
Potassium: 3.2 mmol/L — ABNORMAL LOW (ref 3.5–5.1)
Sodium: 137 mmol/L (ref 135–145)
Total Bilirubin: 1.4 mg/dL — ABNORMAL HIGH (ref 0.3–1.2)
Total Protein: 6.1 g/dL — ABNORMAL LOW (ref 6.5–8.1)

## 2021-05-14 LAB — DIC (DISSEMINATED INTRAVASCULAR COAGULATION)PANEL
D-Dimer, Quant: 0.67 ug/mL-FEU — ABNORMAL HIGH (ref 0.00–0.50)
Fibrinogen: 218 mg/dL (ref 210–475)
INR: 1.2 (ref 0.8–1.2)
Platelets: 163 10*3/uL (ref 150–400)
Prothrombin Time: 14.9 seconds (ref 11.4–15.2)
Smear Review: NONE SEEN
aPTT: 28 seconds (ref 24–36)

## 2021-05-14 LAB — RETICULOCYTES
Immature Retic Fract: 5.7 % (ref 2.3–15.9)
RBC.: 2.43 MIL/uL — ABNORMAL LOW (ref 3.87–5.11)
Retic Count, Absolute: 66.3 10*3/uL (ref 19.0–186.0)
Retic Ct Pct: 2.7 % (ref 0.4–3.1)

## 2021-05-14 LAB — CBC WITH DIFFERENTIAL/PLATELET
Abs Immature Granulocytes: 0.03 10*3/uL (ref 0.00–0.07)
Basophils Absolute: 0 10*3/uL (ref 0.0–0.1)
Basophils Relative: 0 %
Eosinophils Absolute: 0 10*3/uL (ref 0.0–0.5)
Eosinophils Relative: 0 %
HCT: 21.5 % — ABNORMAL LOW (ref 36.0–46.0)
Hemoglobin: 7.1 g/dL — ABNORMAL LOW (ref 12.0–15.0)
Immature Granulocytes: 1 %
Lymphocytes Relative: 15 %
Lymphs Abs: 0.7 10*3/uL (ref 0.7–4.0)
MCH: 29.1 pg (ref 26.0–34.0)
MCHC: 33 g/dL (ref 30.0–36.0)
MCV: 88.1 fL (ref 80.0–100.0)
Monocytes Absolute: 0.8 10*3/uL (ref 0.1–1.0)
Monocytes Relative: 16 %
Neutro Abs: 3.4 10*3/uL (ref 1.7–7.7)
Neutrophils Relative %: 68 %
Platelets: 156 10*3/uL (ref 150–400)
RBC: 2.44 MIL/uL — ABNORMAL LOW (ref 3.87–5.11)
RDW: 21.7 % — ABNORMAL HIGH (ref 11.5–15.5)
WBC: 5 10*3/uL (ref 4.0–10.5)
nRBC: 0.4 % — ABNORMAL HIGH (ref 0.0–0.2)

## 2021-05-14 LAB — TROPONIN I (HIGH SENSITIVITY)
Troponin I (High Sensitivity): 71 ng/L — ABNORMAL HIGH (ref <18)
Troponin I (High Sensitivity): 72 ng/L — ABNORMAL HIGH (ref <18)

## 2021-05-14 LAB — CBG MONITORING, ED
Glucose-Capillary: 109 mg/dL — ABNORMAL HIGH (ref 70–99)
Glucose-Capillary: 124 mg/dL — ABNORMAL HIGH (ref 70–99)
Glucose-Capillary: 157 mg/dL — ABNORMAL HIGH (ref 70–99)
Glucose-Capillary: 177 mg/dL — ABNORMAL HIGH (ref 70–99)
Glucose-Capillary: 255 mg/dL — ABNORMAL HIGH (ref 70–99)

## 2021-05-14 LAB — IRON AND TIBC
Iron: 204 ug/dL — ABNORMAL HIGH (ref 28–170)
Saturation Ratios: 98 % — ABNORMAL HIGH (ref 10.4–31.8)
TIBC: 209 ug/dL — ABNORMAL LOW (ref 250–450)
UIBC: 5 ug/dL

## 2021-05-14 LAB — FOLATE: Folate: 15.7 ng/mL (ref >5.9)

## 2021-05-14 LAB — GLUCOSE, CAPILLARY
Glucose-Capillary: 175 mg/dL — ABNORMAL HIGH (ref 70–99)
Glucose-Capillary: 127 mg/dL — ABNORMAL HIGH (ref 70–99)

## 2021-05-14 LAB — PHOSPHORUS: Phosphorus: 5 mg/dL — ABNORMAL HIGH (ref 2.5–4.6)

## 2021-05-14 LAB — VITAMIN B12: Vitamin B-12: 1155 pg/mL — ABNORMAL HIGH (ref 180–914)

## 2021-05-14 LAB — LACTATE DEHYDROGENASE: LDH: 151 U/L (ref 98–192)

## 2021-05-14 LAB — TSH: TSH: 2.058 u[IU]/mL (ref 0.350–4.500)

## 2021-05-14 LAB — IMMATURE PLATELET FRACTION: Immature Platelet Fraction: 3.7 % (ref 1.2–8.6)

## 2021-05-14 LAB — FERRITIN: Ferritin: 2121 ng/mL — ABNORMAL HIGH (ref 11–307)

## 2021-05-14 LAB — PREPARE RBC (CROSSMATCH)

## 2021-05-14 LAB — HIV ANTIBODY (ROUTINE TESTING W REFLEX): HIV Screen 4th Generation wRfx: NONREACTIVE

## 2021-05-14 LAB — MAGNESIUM: Magnesium: 2 mg/dL (ref 1.7–2.4)

## 2021-05-14 MED ORDER — SODIUM CHLORIDE 0.9% FLUSH: 3.0000 mL | INTRAVENOUS | Status: DC | PRN

## 2021-05-14 MED ORDER — CARVEDILOL 6.25 MG PO TABS
6.2500 mg | ORAL_TABLET | Freq: Two times a day (BID) | ORAL | Status: DC
  Administered 2021-05-15: 6.25 mg via ORAL
  Filled 2021-05-14: qty 1

## 2021-05-14 MED ORDER — LIDOCAINE HCL (PF) 1 % IJ SOLN: 5.0000 mL | INTRAMUSCULAR | Status: DC | PRN

## 2021-05-14 MED ORDER — LATANOPROST 0.005 % OP SOLN
1.0000 [drp] | Freq: Every day | OPHTHALMIC | Status: DC
  Administered 2021-05-14: 1 [drp] via OPHTHALMIC
  Filled 2021-05-14 (×2): qty 2.5

## 2021-05-14 MED ORDER — SODIUM CHLORIDE 0.9 % IV SOLN: 100.0000 mL | INTRAVENOUS | Status: DC | PRN

## 2021-05-14 MED ORDER — PENTAFLUOROPROP-TETRAFLUOROETH EX AERO: 1.0000 "application " | INHALATION_SPRAY | CUTANEOUS | Status: DC | PRN

## 2021-05-14 MED ORDER — ALTEPLASE 2 MG IJ SOLR: 2.0000 mg | Freq: Once | INTRAMUSCULAR | Status: DC | PRN

## 2021-05-14 MED ORDER — LEVOTHYROXINE SODIUM 75 MCG PO TABS
150.0000 ug | ORAL_TABLET | Freq: Every day | ORAL | Status: DC
  Administered 2021-05-14 – 2021-05-15 (×2): 150 ug via ORAL
  Filled 2021-05-14 (×2): qty 2

## 2021-05-14 MED ORDER — ATORVASTATIN CALCIUM 40 MG PO TABS
40.0000 mg | ORAL_TABLET | Freq: Every morning | ORAL | Status: DC
  Administered 2021-05-14 – 2021-05-15 (×2): 40 mg via ORAL
  Filled 2021-05-14 (×2): qty 1

## 2021-05-14 MED ORDER — DIPHENHYDRAMINE HCL 25 MG PO CAPS
25.0000 mg | ORAL_CAPSULE | Freq: Once | ORAL | Status: AC
  Administered 2021-05-14: 25 mg via ORAL

## 2021-05-14 MED ORDER — HYDROXYZINE HCL 10 MG PO TABS
10.0000 mg | ORAL_TABLET | Freq: Every evening | ORAL | Status: DC | PRN
  Administered 2021-05-14 (×2): 10 mg via ORAL
  Filled 2021-05-14 (×2): qty 1

## 2021-05-14 MED ORDER — ACETAMINOPHEN 325 MG PO TABS
650.0000 mg | ORAL_TABLET | Freq: Four times a day (QID) | ORAL | Status: DC | PRN
  Administered 2021-05-14: 650 mg via ORAL
  Filled 2021-05-14: qty 2

## 2021-05-14 MED ORDER — SILDENAFIL CITRATE 20 MG PO TABS
20.0000 mg | ORAL_TABLET | ORAL | Status: DC
  Administered 2021-05-14 – 2021-05-15 (×5): 20 mg via ORAL
  Filled 2021-05-14 (×9): qty 1

## 2021-05-14 MED ORDER — AMIODARONE HCL 200 MG PO TABS
200.0000 mg | ORAL_TABLET | Freq: Every morning | ORAL | Status: DC
  Administered 2021-05-14 – 2021-05-15 (×2): 200 mg via ORAL
  Filled 2021-05-14 (×2): qty 1

## 2021-05-14 MED ORDER — CHLORHEXIDINE GLUCONATE CLOTH 2 % EX PADS
6.0000 | MEDICATED_PAD | Freq: Every day | CUTANEOUS | Status: DC
  Administered 2021-05-15: 6 via TOPICAL

## 2021-05-14 MED ORDER — HEPARIN SODIUM (PORCINE) 1000 UNIT/ML DIALYSIS
1000.0000 [IU] | INTRAMUSCULAR | Status: DC | PRN
  Filled 2021-05-14: qty 1

## 2021-05-14 MED ORDER — SODIUM CHLORIDE 0.9% IV SOLUTION: Freq: Once | INTRAVENOUS | Status: AC

## 2021-05-14 MED ORDER — ZOLPIDEM TARTRATE 5 MG PO TABS
5.0000 mg | ORAL_TABLET | Freq: Every evening | ORAL | Status: DC | PRN
  Administered 2021-05-14: 5 mg via ORAL
  Filled 2021-05-14: qty 1

## 2021-05-14 MED ORDER — DARBEPOETIN ALFA 200 MCG/0.4ML IJ SOSY: 200.0000 ug | PREFILLED_SYRINGE | INTRAMUSCULAR | Status: DC

## 2021-05-14 MED ORDER — SODIUM CHLORIDE 0.9 % IV SOLN: 250.0000 mL | INTRAVENOUS | Status: DC | PRN

## 2021-05-14 MED ORDER — ACETAMINOPHEN 650 MG RE SUPP: 650.0000 mg | Freq: Four times a day (QID) | RECTAL | Status: DC | PRN

## 2021-05-14 MED ORDER — POTASSIUM CHLORIDE CRYS ER 20 MEQ PO TBCR
20.0000 meq | EXTENDED_RELEASE_TABLET | Freq: Once | ORAL | Status: AC
  Administered 2021-05-14: 20 meq via ORAL
  Filled 2021-05-14: qty 1

## 2021-05-14 MED ORDER — DIPHENHYDRAMINE HCL 25 MG PO CAPS
25.0000 mg | ORAL_CAPSULE | Freq: Three times a day (TID) | ORAL | Status: DC | PRN
  Administered 2021-05-14: 25 mg via ORAL

## 2021-05-14 MED ORDER — CARVEDILOL 3.125 MG PO TABS
3.1250 mg | ORAL_TABLET | Freq: Two times a day (BID) | ORAL | Status: DC
  Administered 2021-05-14: 3.125 mg via ORAL
  Filled 2021-05-14: qty 1

## 2021-05-14 MED ORDER — SODIUM CHLORIDE 0.9% FLUSH
3.0000 mL | Freq: Two times a day (BID) | INTRAVENOUS | Status: DC
  Administered 2021-05-14 – 2021-05-15 (×4): 3 mL via INTRAVENOUS

## 2021-05-14 MED ORDER — HYDRALAZINE HCL 100 MG PO TABS: 100.0000 mg | ORAL_TABLET | Freq: Three times a day (TID) | ORAL | Status: DC

## 2021-05-14 MED ORDER — LIDOCAINE-PRILOCAINE 2.5-2.5 % EX CREA: 1.0000 "application " | TOPICAL_CREAM | CUTANEOUS | Status: DC | PRN

## 2021-05-14 MED ORDER — FAMOTIDINE IN NACL 20-0.9 MG/50ML-% IV SOLN
20.0000 mg | Freq: Once | INTRAVENOUS | Status: AC
  Administered 2021-05-14: 20 mg via INTRAVENOUS
  Filled 2021-05-14: qty 50

## 2021-05-14 MED ORDER — HYDRALAZINE HCL 50 MG PO TABS
100.0000 mg | ORAL_TABLET | Freq: Three times a day (TID) | ORAL | Status: DC
  Administered 2021-05-14: 100 mg via ORAL
  Filled 2021-05-14 (×4): qty 2

## 2021-05-14 MED ORDER — ALLOPURINOL 300 MG PO TABS
300.0000 mg | ORAL_TABLET | Freq: Every morning | ORAL | Status: DC
  Administered 2021-05-14 – 2021-05-15 (×2): 300 mg via ORAL
  Filled 2021-05-14 (×2): qty 1

## 2021-05-14 NOTE — Progress Notes (Signed)
PROGRESS NOTE  Patricia Mata JSH:[id number] DOB: [birth year] DOA: 05/13/2021 PCP: [name], FNP   LOS: 0 days   Brief narrative: Patricia Mata is a 65 y.o. female with past medical history of end-stage renal disease on hemodialysis Tuesday Thursday Saturday, diabetes mellitus type 2, atrial fibrillation, history of chronic diastolic heart failure, CAD, aortic stenosis, hypothyroidism and myelodysplastic syndrome presented to hospital with nausea vomiting with black loose stools.  In the ED patient was noted to hemodynamically stable.  Hemoglobin was 8.1.  Of note patient was seen in the ER on 05/07/2021 and had received units of packed RBC for hemoglobin of 7.7 at that time.  At this time, patient has been admitted hospital for further evaluation and treatment.  Assessment/Plan:  Active Problems:   Anemia due to chronic kidney disease   MDS (myelodysplastic syndrome) (HCC)   Hyperlipidemia   Paroxysmal atrial fibrillation (HCC)   Iron deficiency anemia due to chronic blood loss   Hypothyroidism   Pulmonary hypertension (HCC)   DM2 (diabetes mellitus, type 2) (HCC)   Symptomatic anemia   Thrombocytopenia (HCC)   Symptomatic anemia   Hemoglobin of 7.1 today.  Patient did have a recent EGD colonoscopy and was seen by GI in the past.  GI has seen the patient today.  Has received 1 unit of packed RBC.  Might need push enteroscopy if her hemoglobin continues to drop.  We will continue to monitor.  Elevated ferritin.   Anemia due to chronic kidney disease and ongoing GI loss.  We will continue to monitor CBC.   Hyperlipidemia Continue Lipitor.     MDS/thrombocytopenia.  Has been seen by heme oncology.  Will follow recommendations.  Essential  Hypertension.  On Coreg and hydralazine.  Hydralazine on hold.  Will resume Coreg.  Diabetes mellitus type 2.  On long-acting insulin at home.  We will resume at half the dose.  Patient takes insulin glargine 20 units daily as  needed.  We will continue with sliding scale insulin while in hospital.  On linagliptin at home.   Paroxysmal atrial fibrillation Patient is not on anticoagulation due to recurrent GI bleed.  Continue amiodarone.  Will resume Coreg.   Pulmonary hypertension  Patient is on sildenafil from home.  We will continue.   Hypothyroidism - Continue Synthroid.  Abnormal urinalysis.  No overt urinary symptoms.  We will hold off with antibiotic.    ESRD - oh HD nephrology was made aware  DVT prophylaxis: SCDs Start: 05/14/21 0034   Code Status: Full code  Family Communication: Spoke with the patient's family at bedside.  Status is: Observation  The patient will require care spanning > 2 midnights and should be moved to inpatient because: Further monitoring, possible endoscopic evaluation in a.m.  Consultants: GI Nephrology  Procedures: PRBC transfusion 1 unit  Anti-infectives:  None  Anti-infectives (From admission, onward)    None        Subjective: Today, patient was seen and examined at bedside.  Patient denies any nausea or vomiting dizziness lightheadedness shortness of breath.  Objective: Vitals:   05/14/21 1130 05/14/21 1215  BP: (!) 154/48 (!) 121/32  Pulse: 81 70  Resp: 15 13  Temp:    SpO2: 99% 99%    Intake/Output Summary (Last 24 hours) at 05/14/2021 1245 Last data filed at 05/14/2021 0956 Gross per 24 hour  Intake 730.67 ml  Output --  Net 730.67 ml   Filed Weights   05/13/21 1236  Weight: 86.2 kg  Body mass index is 32.61 kg/m.   Physical Exam: GENERAL: Patient is alert awake and oriented. Not in obvious distress.  Obese HENT: Mild pallor noted, pupils equally reactive to light. Oral mucosa is moist NECK: is supple, no gross swelling noted. CHEST: Clear to auscultation. No crackles or wheezes.  Diminished breath sounds bilaterally. CVS: S1 and S2 heard, no murmur. Regular rate and rhythm.  ABDOMEN: Soft, non-tender, bowel sounds are  present. EXTREMITIES: No edema. CNS: Left facial palsy chronic no focal motor deficits. SKIN: warm and dry without rashes.  Data Review: I have personally reviewed the following laboratory data and studies,  CBC: Recent Labs  Lab 05/08/21 1201 05/09/21 1053 05/13/21 1253 05/13/21 2311 05/13/21 2315 05/14/21 0223  WBC 6.1 5.0 4.7 4.5  --  5.0  NEUTROABS  --  3.1 3.1  --   --  3.4  HGB 8.6* 7.7* 8.1* 7.5*  --  7.1*  HCT 25.1* 22.2* 23.2* 22.2*  --  21.5*  MCV 90.9 90.6 87.2 87.1  --  88.1  PLT 261 235 58* 156 163 295   Basic Metabolic Panel: Recent Labs  Lab 05/08/21 1201 05/13/21 1253 05/14/21 0223  NA 135 136 137  K 2.8* 3.7 3.2*  CL 91* 95* 95*  CO2 27 27 31   GLUCOSE 136* 165* 97  BUN 17 52* 61*  CREATININE 2.11* 4.13* 4.57*  CALCIUM 8.5* 9.5 9.7  MG  --   --  2.0  PHOS  --   --  5.0*   Liver Function Tests: Recent Labs  Lab 05/13/21 1253 05/14/21 0223  AST 54* 45*  ALT 35 30  ALKPHOS 104 102  BILITOT 1.4* 1.4*  PROT 6.3* 6.1*  ALBUMIN 3.3* 3.1*   Recent Labs  Lab 05/13/21 1253  LIPASE 27   No results for input(s): AMMONIA in the last 168 hours. Cardiac Enzymes: No results for input(s): CKTOTAL, CKMB, CKMBINDEX, TROPONINI in the last 168 hours. BNP (last 3 results) Recent Labs    06/07/20 0826 07/06/20 1733 11/21/20 1102  BNP 489.2* 932.6* 1,054.4*    ProBNP (last 3 results) No results for input(s): PROBNP in the last 8760 hours.  CBG: Recent Labs  Lab 05/13/21 2025 05/13/21 2358 05/14/21 0712 05/14/21 1219  GLUCAP 194* 109* 124* 255*   Recent Results (from the past 240 hour(s))  Resp Panel by RT-PCR (Flu A&B, Covid) Nasopharyngeal Swab     Status: None   Collection Time: 05/13/21  6:36 PM   Specimen: Nasopharyngeal Swab; Nasopharyngeal(NP) swabs in vial transport medium  Result Value Ref Range Status   SARS Coronavirus 2 by RT PCR NEGATIVE NEGATIVE Final    Comment: (NOTE) SARS-CoV-2 target nucleic acids are NOT  DETECTED.  The SARS-CoV-2 RNA is generally detectable in upper respiratory specimens during the acute phase of infection. The lowest concentration of SARS-CoV-2 viral copies this assay can detect is 138 copies/mL. A negative result does not preclude SARS-Cov-2 infection and should not be used as the sole basis for treatment or other patient management decisions. A negative result may occur with  improper specimen collection/handling, submission of specimen other than nasopharyngeal swab, presence of viral mutation(s) within the areas targeted by this assay, and inadequate number of viral copies(<138 copies/mL). A negative result must be combined with clinical observations, patient history, and epidemiological information. The expected result is Negative.  Fact Sheet for Patients:  EntrepreneurPulse.com.au  Fact Sheet for Healthcare Providers:  IncredibleEmployment.be  This test is no t yet approved or cleared  by the [place] and  has been authorized for detection and/or diagnosis of SARS-CoV-2 by FDA under an Emergency Use Authorization (EUA). This EUA will remain  in effect (meaning this test can be used) for the duration of the COVID-19 declaration under Section 564(b)(1) of the Act, 21 U.S.C.section 360bbb-3(b)(1), unless the authorization is terminated  or revoked sooner.       Influenza A by PCR NEGATIVE NEGATIVE Final   Influenza B by PCR NEGATIVE NEGATIVE Final    Comment: (NOTE) The Xpert Xpress SARS-CoV-2/FLU/RSV plus assay is intended as an aid in the diagnosis of influenza from Nasopharyngeal swab specimens and should not be used as a sole basis for treatment. Nasal washings and aspirates are unacceptable for Xpert Xpress SARS-CoV-2/FLU/RSV testing.  Fact Sheet for Patients: EntrepreneurPulse.com.au  Fact Sheet for Healthcare Providers: IncredibleEmployment.be  This test is not yet  approved or cleared by the [place] FDA and has been authorized for detection and/or diagnosis of SARS-CoV-2 by FDA under an Emergency Use Authorization (EUA). This EUA will remain in effect (meaning this test can be used) for the duration of the COVID-19 declaration under Section 564(b)(1) of the Act, 21 U.S.C. section 360bbb-3(b)(1), unless the authorization is terminated or revoked.  Performed at [facility], [place] [address], [place], [place] [address]      Studies: No results found.  Flora Lipps, MD  [facility] 05/14/2021  If 7PM-7AM, please contact night-coverage

## 2021-05-14 NOTE — ED Notes (Signed)
Benadryl PO given for itching to both hands. Will continue to monitor.

## 2021-05-14 NOTE — Consult Note (Addendum)
[facility] Renal Consultation Note    Indication for Consultation:  Management of ESRD/hemodialysis; anemia, hypertension/volume and secondary hyperparathyroidism PCP: [name] FNP  HPI: Patricia Mata is a 65 Y/O female with ESRD on hemodialysis T,Th,S at [facility]. 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 Alen Blew requiring q 2 weekly blood transfusions in addition to ESA given at OP HD units. H/O Stage III colon cancer S/P hemicolectomy 2008. Recent admission 10/08-10/04/2021 for PAF with RVR. Seen in ED 05/08/2021  for palpitations/Afib RVR which converted to SR without intervention. She is compliant with HD Rx, last HD 05/12/2021.   She presented to ED 05/13/2021 with C/O N & V with streaks of blood in emesis, loose stools. HGB on arrival 8.1 Na 136 K+3.7 BS 165 SCr 4.13 BUN 52 CO2 27. She has been admitted as observation patient for nausea, vomiting, anemia. HGB down to 7.2 this AM, she is S/P 1 unit PRBCs. GI has been consulted. Initial platelets 58 on admission but 156 on repeat labs. Hematology consulted.   Past Medical History:  Diagnosis Date   Acute renal failure (ARF) ([place]) 06/08/2018   TTHS- [place]   Carotid artery disease [facility])    Chronic diastolic (congestive) heart failure (HCC)    Colon cancer ([place]) 2008   Diabetes mellitus ([place])    Dyspnea    ESRD on hemodialysis ([place])    Gout 09/08/2018   Heart attack ([place])    Heart murmur    Hypertension    Hypothyroidism    Macrocytic anemia    MDS (myelodysplastic syndrome) (HCC)    Moderate mitral stenosis    PAD (peripheral artery disease) (HCC)    PAF (paroxysmal atrial fibrillation) (HCC)    Pulmonary hypertension (HCC)    Renal artery stenosis (HCC)    a. >60% R renal artery stenosis by duplex 2021, managed medically.   Severe aortic stenosis    Transfusion-dependent anemia    Past Surgical History:  Procedure Laterality Date   AV  FISTULA PLACEMENT Right 09/26/2020   Procedure: RIGHT ARM ARTERIOVENOUS (AV) FISTULA CREATION;  Surgeon: Waynetta Sandy, MD;  Location: [place];  Service: Vascular;  Laterality: Right;  Monitor Anesthesia Care with Regional Block to Right Arm    [place] Right 12/13/2020   Procedure: RIGHT UPPER EXTREMITY ARTERIOVENOUS FISTULA TRANSPOSITION;  Surgeon: Waynetta Sandy, MD;  Location: [place];  Service: Vascular;  Laterality: Right;   COLON SURGERY     COLONOSCOPY WITH PROPOFOL N/A 02/22/2021   Procedure: COLONOSCOPY WITH PROPOFOL;  Surgeon: Otis Brace, MD;  Location: [place];  Service: Gastroenterology;  Laterality: N/A;   DEBRIDMENT OF DECUBITUS ULCER N/A 06/12/2018   Procedure: DEBRIDMENT OF DECUBITUS ULCER;  Surgeon: Wallace Going, DO;  Location: [facility];  Service: Plastics;  Laterality: N/A;   ENTEROSCOPY N/A 02/22/2021   Procedure: ENTEROSCOPY;  Surgeon: Otis Brace, MD;  Location: [facility];  Service: Gastroenterology;  Laterality: N/A;   ESOPHAGOGASTRODUODENOSCOPY N/A 09/01/2020   Procedure: ESOPHAGOGASTRODUODENOSCOPY (EGD);  Surgeon: Clarene Essex, MD;  Location: [facility];  Service: Endoscopy;  Laterality: N/A;   GIVENS CAPSULE STUDY N/A 09/01/2020   Procedure: GIVENS CAPSULE STUDY;  Surgeon: Clarene Essex, MD;  Location: [facility];  Service: Endoscopy;  Laterality: N/A;   HEMOSTASIS CLIP PLACEMENT  02/22/2021   Procedure: HEMOSTASIS CLIP PLACEMENT;  Surgeon: Otis Brace, MD;  Location: [place];  Service: Gastroenterology;;   HOT HEMOSTASIS N/A 02/22/2021  Procedure: HOT HEMOSTASIS (ARGON PLASMA COAGULATION/BICAP);  Surgeon: Otis Brace, MD;  Location: [facility] ENDOSCOPY;  Service: Gastroenterology;  Laterality: N/A;   IR FLUORO GUIDE CV LINE RIGHT  10/11/2020   IR FLUORO GUIDE PORT INSERTION RIGHT  04/02/2017   IR REMOVAL TUN ACCESS W/ PORT W/O FL MOD SED  03/16/2019   IR US GUIDE VASC ACCESS RIGHT  04/02/2017   IR US  GUIDE VASC ACCESS RIGHT  10/11/2020   LIGATION OF COMPETING BRANCHES OF ARTERIOVENOUS FISTULA Right 12/13/2020   Procedure: LIGATION OF COMPETING BRANCHES OF RIGHT UPPER EXTREMITY ARTERIOVENOUS FISTULA;  Surgeon: Waynetta Sandy, MD;  Location: [place];  Service: Vascular;  Laterality: Right;   POLYPECTOMY  02/22/2021   Procedure: POLYPECTOMY;  Surgeon: Otis Brace, MD;  Location: [place] [facility];  Service: Gastroenterology;;   RIGHT HEART CATH N/A 04/23/2019   Procedure: RIGHT HEART CATH;  Surgeon: Jolaine Artist, MD;  Location: [place] [facility];  Service: Cardiovascular;  Laterality: N/A;   RIGHT/LEFT HEART CATH AND CORONARY ANGIOGRAPHY N/A 05/21/2018   Procedure: RIGHT/LEFT HEART CATH AND CORONARY ANGIOGRAPHY;  Surgeon: Nelva Bush, MD;  Location: [place] [facility];  Service: Cardiovascular;  Laterality: N/A;   RIGHT/LEFT HEART CATH AND CORONARY ANGIOGRAPHY N/A 10/13/2020   Procedure: RIGHT/LEFT HEART CATH AND CORONARY ANGIOGRAPHY;  Surgeon: Jolaine Artist, MD;  Location: [place] [facility];  Service: Cardiovascular;  Laterality: N/A;   TUBAL LIGATION     Family History  Problem Relation Age of Onset   Hypertension Mother    Diabetes Mother    Cervical cancer Mother    Heart attack Father    Hypertension Sister    Hypertension Brother    Hypertension Sister    Hypertension Sister    Prostate cancer Brother    HIV/AIDS Brother    Social History:  reports that she quit smoking about 25 years ago. Her smoking use included cigarettes. She has a 5.00 pack-year smoking history. She has quit using smokeless tobacco. She reports current alcohol use of about 1.0 standard drink per week. She reports that she does not use drugs. Allergies  Allergen Reactions   Lactose Intolerance (Gi) Diarrhea   Morphine And Related Itching   Sulfa Antibiotics Rash and Other (See Comments)    Blisters, also   Prior to Admission medications   Medication Sig Start Date End Date Taking?  Authorizing Provider  acetaminophen (TYLENOL) 500 MG tablet Take 1,000 mg by mouth every 6 (six) hours as needed (pain).   Yes [provider]  allopurinol (ZYLOPRIM) 300 MG tablet Take 300 mg by mouth every morning. 07/05/20  Yes [provider]  amiodarone (PACERONE) 200 MG tablet Take 1 tablet by mouth once daily Patient taking differently: Take 200 mg by mouth every morning. 05/07/21  Yes Fenton, Clint R, PA  atorvastatin (LIPITOR) 40 MG tablet Take 1 tablet by mouth once daily Patient taking differently: Take 40 mg by mouth every morning. 11/13/20  Yes [name], FNP  carvedilol (COREG) 6.25 MG tablet Take 1 tablet (6.25 mg total) by mouth 2 (two) times daily with a meal. Patient taking differently: Take 6.25 mg by mouth See admin instructions. Take one tablet (6.25 mg) by mouth in the morning and at supper on Sunday, Monday, Wednesday, Friday (non-dialysis days); on Tuesday, Thursday, Saturday (dialysis days) - take one tablet (6.25 mg) after dialysis as needed for SBP >140, then take one tablet (6.25 mg) with dinner (5pm) and at bedtime. 04/23/21  Yes Lala Lund  K, MD  diphenhydrAMINE (BENADRYL) 25 MG tablet Take 25 mg by mouth every 8 (eight) hours as needed for itching.   Yes [provider]  ethyl chloride spray Apply 1 application topically See admin instructions. Apply topically to dialysis site if there is no time to use emla cream 03/01/21  Yes [provider]  hydrALAZINE (APRESOLINE) 100 MG tablet Take 1 tablet (100 mg total) by mouth 3 (three) times daily. Patient taking differently: Take 100 mg by mouth See admin instructions. Take one tablet (100 mg) by mouth three times daily - morning, dinner and bedtime 09/27/20  Yes Lorretta Harp, MD  Insulin Glargine [facility]) 100 UNIT/ML Inject 20 Units into the skin daily as needed (only if blood sugar over 150).   Yes [provider]  latanoprost (XALATAN) 0.005 % ophthalmic  solution Place 1 drop into both eyes at bedtime.    Yes [provider]  levothyroxine (SYNTHROID) 150 MCG tablet TAKE 1 TABLET BY MOUTH ONCE DAILY BEFORE BREAKFAST Patient taking differently: Take 150 mcg by mouth daily before breakfast. 04/16/21  Yes [name], FNP  lidocaine-prilocaine (EMLA) cream Apply 1 application topically See admin instructions. Apply to dialysis site 30 minutes before arriving at dialysis on Tuesday, Thursday, Saturday 01/23/21  Yes [provider]  linagliptin (TRADJENTA) 5 MG TABS tablet Take 1 tablet (5 mg total) by mouth daily. 04/05/21  Yes [name], FNP  Multiple Vitamin (MULTIVITAMIN WITH MINERALS) TABS tablet Take 1 tablet by mouth every morning. Centrum Silver 50+   Yes [provider]  omeprazole (PRILOSEC) 40 MG capsule Take 1 capsule (40 mg total) by mouth 2 (two) times daily. Patient taking differently: Take 40 mg by mouth 2 (two) times daily with a meal. 04/16/21  Yes [name], FNP  polyethylene glycol (MIRALAX / GLYCOLAX) 17 g packet Take 17 g by mouth daily as needed for moderate constipation. 01/14/21  Yes Vann, Jessica U, DO  sildenafil (REVATIO) 20 MG tablet TAKE 1 TABLET BY MOUTH THREE TIMES DAILY Patient taking differently: Take 20 mg by mouth See admin instructions. Take one tablet (20 mg) by mouth three times daily - morning, supper and bedtime 02/05/21  Yes Sherren Mocha, MD  blood glucose meter kit and supplies Dispense based on patient and insurance preference. Use up to four times daily as directed. (FOR ICD-10 E10.9, E11.9). 11/04/18   [name], FNP  Blood Glucose Monitoring Suppl [facility] Karsten Fells) w/Device KIT Check blood sugars twice daily E11.9 11/30/19   [name], FNP  glucose blood test strip check blood sugars twice daily Dx code E11.22 03/02/20   [name], FNP  Insulin Pen Needle 29G X 5MM MISC Use as directed 06/16/17   Bonnielee Haff, MD  Lancets (ONETOUCH DELICA PLUS [id number]) [place]  check blood sugars twice daily Dx code: E11.22 03/02/20   [name], FNP  Methoxy PEG-Epoetin Beta (MIRCERA IJ) Mircera 05/03/21 05/02/22  [provider]   Current Facility-Administered Medications  Medication Dose Route Frequency Provider Last Rate Last Admin   0.9 %  sodium chloride infusion  250 mL Intravenous PRN Doutova, Anastassia, MD       acetaminophen (TYLENOL) tablet 650 mg  650 mg Oral Q6H PRN Toy Baker, MD   650 mg at 05/14/21 0219   Or   acetaminophen (TYLENOL) suppository 650 mg  650 mg Rectal Q6H PRN Doutova, Nyoka Lint, MD       allopurinol (ZYLOPRIM) tablet 300 mg  300 mg Oral q morning Doutova,  Nyoka Lint, MD   300 mg at 05/14/21 0955   amiodarone (PACERONE) tablet 200 mg  200 mg Oral q morning Toy Baker, MD   200 mg at 05/14/21 0955   atorvastatin (LIPITOR) tablet 40 mg  40 mg Oral q morning Toy Baker, MD   40 mg at 05/14/21 0955   carvedilol (COREG) tablet 3.125 mg  3.125 mg Oral BID WC Pokhrel, Laxman, MD       hydrOXYzine (ATARAX/VISTARIL) tablet 10 mg  10 mg Oral QHS PRN Toy Baker, MD   10 mg at 05/14/21 0216   insulin aspart (novoLOG) injection 0-6 Units  0-6 Units Subcutaneous Q4H Doutova, Nyoka Lint, MD   3 Units at 05/14/21 1227   latanoprost (XALATAN) 0.005 % ophthalmic solution 1 drop  1 drop Both Eyes QHS Doutova, Anastassia, MD       levothyroxine (SYNTHROID) tablet 150 mcg  150 mcg Oral QAC breakfast Toy Baker, MD   150 mcg at 05/14/21 0956   pantoprazole (PROTONIX) injection 40 mg  40 mg Intravenous Q12H Doutova, Nyoka Lint, MD   40 mg at 05/14/21 0955   sildenafil (REVATIO) tablet 20 mg  20 mg Oral 3 times per day Toy Baker, MD   20 mg at 05/14/21 0956   sodium chloride flush (NS) 0.9 % injection 3 mL  3 mL Intravenous Q12H Doutova, Anastassia, MD   3 mL at 05/14/21 0957   sodium chloride flush (NS) 0.9 % injection 3 mL  3 mL Intravenous PRN Toy Baker, MD       Current Outpatient  Medications  Medication Sig Dispense Refill   acetaminophen (TYLENOL) 500 MG tablet Take 1,000 mg by mouth every 6 (six) hours as needed (pain).     allopurinol (ZYLOPRIM) 300 MG tablet Take 300 mg by mouth every morning.     amiodarone (PACERONE) 200 MG tablet Take 1 tablet by mouth once daily (Patient taking differently: Take 200 mg by mouth every morning.) 90 tablet 1   atorvastatin (LIPITOR) 40 MG tablet Take 1 tablet by mouth once daily (Patient taking differently: Take 40 mg by mouth every morning.) 90 tablet 1   carvedilol (COREG) 6.25 MG tablet Take 1 tablet (6.25 mg total) by mouth 2 (two) times daily with a meal. (Patient taking differently: Take 6.25 mg by mouth See admin instructions. Take one tablet (6.25 mg) by mouth in the morning and at supper on Sunday, Monday, Wednesday, Friday (non-dialysis days); on Tuesday, Thursday, Saturday (dialysis days) - take one tablet (6.25 mg) after dialysis as needed for SBP >140, then take one tablet (6.25 mg) with dinner (5pm) and at bedtime.) 60 tablet 0   diphenhydrAMINE (BENADRYL) 25 MG tablet Take 25 mg by mouth every 8 (eight) hours as needed for itching.     ethyl chloride spray Apply 1 application topically See admin instructions. Apply topically to dialysis site if there is no time to use emla cream     hydrALAZINE (APRESOLINE) 100 MG tablet Take 1 tablet (100 mg total) by mouth 3 (three) times daily. (Patient taking differently: Take 100 mg by mouth See admin instructions. Take one tablet (100 mg) by mouth three times daily - morning, dinner and bedtime) 90 tablet 11   Insulin Glargine (BASAGLAR KWIKPEN) 100 UNIT/ML Inject 20 Units into the skin daily as needed (only if blood sugar over 150).     latanoprost (XALATAN) 0.005 % ophthalmic solution Place 1 drop into both eyes at bedtime.      levothyroxine (SYNTHROID) 150 MCG  tablet TAKE 1 TABLET BY MOUTH ONCE DAILY BEFORE BREAKFAST (Patient taking differently: Take 150 mcg by mouth daily before  breakfast.) 30 tablet 0   lidocaine-prilocaine (EMLA) cream Apply 1 application topically See admin instructions. Apply to dialysis site 30 minutes before arriving at dialysis on Tuesday, Thursday, Saturday     linagliptin (TRADJENTA) 5 MG TABS tablet Take 1 tablet (5 mg total) by mouth daily. 90 tablet 1   Multiple Vitamin (MULTIVITAMIN WITH MINERALS) TABS tablet Take 1 tablet by mouth every morning. Centrum Silver 50+     omeprazole (PRILOSEC) 40 MG capsule Take 1 capsule (40 mg total) by mouth 2 (two) times daily. (Patient taking differently: Take 40 mg by mouth 2 (two) times daily with a meal.) 60 capsule 2   polyethylene glycol (MIRALAX / GLYCOLAX) 17 g packet Take 17 g by mouth daily as needed for moderate constipation.     sildenafil (REVATIO) 20 MG tablet TAKE 1 TABLET BY MOUTH THREE TIMES DAILY (Patient taking differently: Take 20 mg by mouth See admin instructions. Take one tablet (20 mg) by mouth three times daily - morning, supper and bedtime) 90 tablet 4   blood glucose meter kit and supplies Dispense based on patient and insurance preference. Use up to four times daily as directed. (FOR ICD-10 E10.9, E11.9). 1 each 3   Blood Glucose Monitoring Suppl (ONETOUCH ULTRALINK) w/Device KIT Check blood sugars twice daily E11.9 1 kit 0   glucose blood test strip check blood sugars twice daily Dx code E11.22 100 each 3   Insulin Pen Needle 29G X 5MM MISC Use as directed 200 each 0   Lancets (ONETOUCH DELICA PLUS [id number]) MISC check blood sugars twice daily Dx code: E11.22 100 each 3   Methoxy PEG-Epoetin Beta (MIRCERA IJ) Mircera     Labs: Basic Metabolic Panel: Recent Labs  Lab 05/08/21 1201 05/13/21 1253 05/14/21 0223  NA 135 136 137  K 2.8* 3.7 3.2*  CL 91* 95* 95*  CO2 _0 GLUCOSE 136* 165* 97  BUN 17 52* 61*  CREATININE 2.11* 4.13* 4.57*  CALCIUM 8.5* 9.5 9.7  PHOS  --   --  5.0*   Liver Function Tests: Recent Labs  Lab 05/13/21 1253 05/14/21 0223  AST 54* 45*   ALT 35 30  ALKPHOS 104 102  BILITOT 1.4* 1.4*  PROT 6.3* 6.1*  ALBUMIN 3.3* 3.1*   Recent Labs  Lab 05/13/21 1253  LIPASE 27   No results for input(s): AMMONIA in the last 168 hours. CBC: Recent Labs  Lab 05/08/21 1201 05/09/21 1053 05/13/21 1253 05/13/21 2311 05/13/21 2315 05/14/21 0223  WBC 6.1 5.0 4.7 4.5  --  5.0  NEUTROABS  --  3.1 3.1  --   --  3.4  HGB 8.6* 7.7* 8.1* 7.5*  --  7.1*  HCT 25.1* 22.2* 23.2* 22.2*  --  21.5*  MCV 90.9 90.6 87.2 87.1  --  88.1  PLT 261 235 58* 156 163 156   Cardiac Enzymes: No results for input(s): CKTOTAL, CKMB, CKMBINDEX, TROPONINI in the last 168 hours. CBG: Recent Labs  Lab 05/13/21 2025 05/13/21 2358 05/14/21 0712 05/14/21 1219  GLUCAP 194* 109* 124* 255*   Iron Studies:  Recent Labs    05/14/21 0223  IRON 204*  TIBC 209*  FERRITIN 2,121*   Studies/Results: No results found.  ROS: As per HPI otherwise negative.   Physical Exam: Vitals:   05/14/21 1045 05/14/21 1130 05/14/21 1215 05/14/21 1335  BP: Marland Kitchen)  175/39 (!) 154/48 (!) 121/32 (!) 153/32  Pulse: 68 81 70 72  Resp: _0 Temp:      TempSrc:      SpO2: 99% 99% 99% 99%  Weight:         General: Well developed, well nourished, in no acute distress. Head: Normocephalic, atraumatic, sclera non-icteric, mucus membranes are moist Neck: Supple. JVD not elevated. Lungs: Clear bilaterally to auscultation without wheezes, rales, or rhonchi. Breathing is unlabored. Heart: RRR with S1 S2. No murmurs, rubs, or gallops appreciated. Abdomen: Soft, non-tender, non-distended with normoactive bowel sounds. No rebound/guarding. No obvious abdominal masses. M-S:  Strength and tone appear normal for age. Lower extremities:without edema or ischemic changes, no open wounds  Neuro: Alert and oriented X 3. Moves all extremities spontaneously. Psych:  Responds to questions appropriately with a normal affect. Dialysis Access:  R AVF +T/B  Dialysis Orders: [facility]  T,Th,S 4 hrs 180NRe 400/Autoflow 1.5 87.4 kg 2.0K/2.0 Ca AVF -Heparin 2000 units IV TIW -Mircera 200 mcg IV q 2 weeks (last dose 05/03/2021) -Calcitriol 0.25 mcg PO TIW   Assessment/Plan:  Symptomatic Anemia: S/P 1 unit PRBCS. GI consulted. H/O AVMS. Supportive care for now. Per primary Hypokalemia: Use 4.0 K bath. Give K Dur 20 meq PO once today.  H/O PAF RVR-not on anticoagulation. Per primary  MDS-followed by oncology. Per primary  ESRD -  T,Th,S. Next HD 05/16/2021.   Hypertension/volume  - No evidence of overt volume overload. Has been getting to EDW, BP variable control. UF as tolerated.   Anemia  - As noted above. ESA due 05/17/2021. Transfuse as needed.   Metabolic bone disease - OP labs at goal. No binders on medication list. Continue VDRA  Nutrition - soft diet at present. Advance to renal/carb mod diet. Albumin 3.3 Add protein supps.  DMT2 per primary H/O Pulmonary HTN per primary  Threasa Kinch H. Owens Shark, NP-C 05/14/2021, 2:02 PM  [employer] [phone]

## 2021-05-14 NOTE — Consult Note (Signed)
Referring Provider:  [place] Primary Care Physician:  [name], FNP Primary Gastroenterologist:  Dr. Michail Sermon  Reason for Consultation: Recurrent anemia, upper GI bleed  HPI: Patricia Mata is a 65 y.o. female  history of MDS requiring chronic transfusions, colon cancer s/p right hemicolectomy in 2008, H-pylori positive gastritis (2017), CKD, HFpEF, and epistaxis admitted to the hospital with nausea and vomiting and streaks of blood in the vomiting.  Also complaining of diarrhea.  Blood work yesterday showed hemoglobin of 7.5 which is stable compared to her previous reading.  Normal lipase.  AST 54, T bili 1.4 otherwise normal LFTs.  Creatinine 1.13.  Normal INR.  She has received blood transfusion without any significant improvement in hemoglobin.  Patient seen and examined at bedside.  She started having nausea vomiting and diarrhea followed by streaks of blood in the vomiting as well as some bright blood/maroon blood in the stool.  Her diarrhea has improved.  Now having loose stools.  No bowel movement today.  No further vomiting today.    Previous GI work-up  She was seen by GI recently for recurrent anemia.    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  Treated with APC and 2 clips placement.  Also showed few scattered AVMs in the jejunum treated with APC.  Small duodenal polyp removed with a hot snare.  1 small AVM in the gastric body treated with APC.  -Colonoscopy showed fair prep but no evidence of active bleeding.  - EGD on September 01, 2020 which showed mild gastritis and abnormal mucosa in the duodenum (melanosis or iron stain )but no active bleeding.  Capsule endoscopy in February 2022 showed questionable abnormal proximal small bowel mucosa otherwise no active bleeding     EGD 05/2016: acute gastritis, H pylori positive per biopsy   Colonoscopy 08/2019: one  tubular adenoma, 2 hyperplastic polyps, repeat 3 years  Past Medical History:  Diagnosis Date   Acute renal failure (ARF) ([place]) 06/08/2018   TTHS- [place]   Carotid artery disease ([place])    Chronic diastolic (congestive) heart failure ([place])    Colon cancer ([place]) 2008   Diabetes mellitus ([place])    Dyspnea    ESRD on hemodialysis ([place])    Gout 09/08/2018   Heart attack ([place])    Heart murmur    Hypertension    Hypothyroidism    Macrocytic anemia    MDS (myelodysplastic syndrome) (H[facility])    Moderate mitral stenosis    PAD (peripheral artery disease) ([facility])    PAF (paroxysmal atrial fibrillation) (H[facility])    Pulmonary hypertension (H[facility])    Renal artery stenosis ([place])    a. >60% R renal artery stenosis by duplex 2021, managed medically.   Severe aortic stenosis    Transfusion-dependent anemia     Past Surgical History:  Procedure Laterality Date   AV FISTULA PLACEMENT Right 09/26/2020   Procedure: RIGHT ARM ARTERIOVENOUS (AV) FISTULA CREATION;  Surgeon: Waynetta Sandy, MD;  Location: [facility];  Service: Vascular;  Laterality: Right;  Monitor Anesthesia Care with Regional Block to Right Arm    [place] Right 12/13/2020   Procedure: RIGHT UPPER EXTREMITY ARTERIOVENOUS FISTULA TRANSPOSITION;  Surgeon: Waynetta Sandy, MD;  Location: [facility];  Service: Vascular;  Laterality: Right;   COLON SURGERY     COLONOSCOPY WITH PROPOFOL N/A 02/22/2021   Procedure: COLONOSCOPY WITH PROPOFOL;  Surgeon: Otis Brace,  MD;  Location: [facility];  Service: Gastroenterology;  Laterality: N/A;   DEBRIDMENT OF DECUBITUS ULCER N/A 06/12/2018   Procedure: DEBRIDMENT OF DECUBITUS ULCER;  Surgeon: Wallace Going, DO;  Location: [facility];  Service: Plastics;  Laterality: N/A;   ENTEROSCOPY N/A 02/22/2021   Procedure: ENTEROSCOPY;  Surgeon: Otis Brace, MD;  [facility];  Service: Gastroenterology;  Laterality: N/A;   ESOPHAGOGASTRODUODENOSCOPY N/A  09/01/2020   Procedure: ESOPHAGOGASTRODUODENOSCOPY (EGD);  Surgeon: Clarene Essex, MD;  Location[facility];  Service: Endoscopy;  Laterality: N/A;   GIVENS CAPSULE STUDY N/A 09/01/2020   Procedure: GIVENS CAPSULE STUDY;  Surgeon: Clarene Essex, MD;  [facility];  Service: Endoscopy;  Laterality: N/A;   HEMOSTASIS CLIP PLACEMENT  02/22/2021   Procedure: HEMOSTASIS CLIP PLACEMENT;  Surgeon: Otis Brace, MD;  Location: [place] [facility];  Service: Gastroenterology;;   HOT HEMOSTASIS N/A 02/22/2021   Procedure: HOT HEMOSTASIS (ARGON PLASMA COAGULATION/BICAP);  Surgeon: Otis Brace, MD;  [facility];  Service: Gastroenterology;  Laterality: N/A;   IR FLUORO GUIDE CV LINE RIGHT  10/11/2020   IR FLUORO GUIDE PORT INSERTION RIGHT  04/02/2017   IR REMOVAL TUN ACCESS W/ PORT W/O FL MOD SED  03/16/2019   IR US GUIDE VASC ACCESS RIGHT  04/02/2017   IR US GUIDE VASC ACCESS RIGHT  10/11/2020   LIGATION OF COMPETING BRANCHES OF ARTERIOVENOUS FISTULA Right 12/13/2020   Procedure: LIGATION OF COMPETING BRANCHES OF RIGHT UPPER EXTREMITY ARTERIOVENOUS FISTULA;  Surgeon: Waynetta Sandy, MD;  Location: [place] [place];  Service: Vascular;  Laterality: Right;   POLYPECTOMY  02/22/2021   Procedure: POLYPECTOMY;  Surgeon: Otis Brace, MD;  Location: [facility];  Service: Gastroenterology;;   RIGHT HEART CATH N/A 04/23/2019   Procedure: RIGHT HEART CATH;  Surgeon: Jolaine Artist, MD;  Location: [facility];  Service: Cardiovascular;  Laterality: N/A;   RIGHT/LEFT HEART CATH AND CORONARY ANGIOGRAPHY N/A 05/21/2018   Procedure: RIGHT/LEFT HEART CATH AND CORONARY ANGIOGRAPHY;  Surgeon: Nelva Bush, MD;  Location: [facility];  Service: Cardiovascular;  Laterality: N/A;   RIGHT/LEFT HEART CATH AND CORONARY ANGIOGRAPHY N/A 10/13/2020   Procedure: RIGHT/LEFT HEART CATH AND CORONARY ANGIOGRAPHY;  Surgeon: Jolaine Artist, MD;  Location: [facility];  Service:  Cardiovascular;  Laterality: N/A;   TUBAL LIGATION      Prior to Admission medications   Medication Sig Start Date End Date Taking? Authorizing Provider  allopurinol (ZYLOPRIM) 300 MG tablet Take 300 mg by mouth daily. 07/05/20  Yes [provider]  atorvastatin (LIPITOR) 40 MG tablet Take 1 tablet by mouth once daily Patient taking differently: Take 40 mg by mouth daily. 11/13/20  Yes [name], FNP  carvedilol (COREG) 3.125 MG tablet Take 1 tablet (3.125 mg total) by mouth 2 (two) times daily. 11/27/20  Yes Clegg, Amy D, NP  cholecalciferol (VITAMIN D3) 25 MCG (1000 UNIT) tablet Take 1,000 Units by mouth daily.   Yes [provider]  cloNIDine (CATAPRES) 0.1 MG tablet Take 1 tablet (0.1 mg total) by mouth 2 (two) times daily. 07/03/20  Yes Bensimhon, Shaune Pascal, MD  EUTHYROX 150 MCG tablet TAKE 1 TABLET BY MOUTH ONCE DAILY BEFORE BREAKFAST Patient taking differently: Take 150 mcg by mouth daily before breakfast. 02/12/21  Yes [name], FNP  hydrALAZINE (APRESOLINE) 100 MG tablet Take 1 tablet (100 mg total) by mouth 3 (three) times daily. 09/27/20  Yes Lorretta Harp, MD  Insulin Glargine [facility] KWIKPEN) 100 UNIT/ML Inject 15  Units into the skin daily as needed (only if blood sugar over 150). Only if above 150   Yes [provider]  latanoprost (XALATAN) 0.005 % ophthalmic solution Place 1 drop into both eyes at bedtime.    Yes [provider]  lidocaine-prilocaine (EMLA) cream Apply 1 application topically daily as needed (port access). 01/23/21  Yes [provider]  linagliptin (TRADJENTA) 5 MG TABS tablet Take 1 tablet (5 mg total) by mouth daily. 12/12/20  Yes [name], FNP  Multiple Vitamins-Minerals (CENTRUM SILVER 50+WOMEN) TABS Take 1 tablet by mouth daily.   Yes [provider]  polyethylene glycol (MIRALAX / GLYCOLAX) 17 g packet Take 17 g by mouth daily as needed for moderate constipation. 01/14/21  Yes Vann, Jessica U, DO   sildenafil (REVATIO) 20 MG tablet TAKE 1 TABLET BY MOUTH THREE TIMES DAILY Patient taking differently: Take 20 mg by mouth 3 (three) times daily. 02/05/21  Yes Sherren Mocha, MD  amiodarone (PACERONE) 200 MG tablet Take 1 tablet (200 mg total) by mouth daily. Patient not taking: Reported on 02/20/2021 07/19/20   Bensimhon, Shaune Pascal, MD  blood glucose meter kit and supplies Dispense based on patient and insurance preference. Use up to four times daily as directed. (FOR ICD-10 E10.9, E11.9). 11/04/18   [name], FNP  Blood Glucose Monitoring Suppl [facility] Karsten Fells) w/Device KIT Check blood sugars twice daily E11.9 11/30/19   [name], FNP  glucose blood test strip check blood sugars twice daily Dx code E11.22 03/02/20   [name], FNP  Insulin Pen Needle 29G X 5MM MISC Use as directed 06/16/17   Bonnielee Haff, MD  Lancets (ONETOUCH DELICA PLUS [id number]) [place] check blood sugars twice daily Dx code: E11.22 03/02/20   [name], FNP    Scheduled Meds:  allopurinol  300 mg Oral q morning   amiodarone  200 mg Oral q morning   atorvastatin  40 mg Oral q morning   insulin aspart  0-6 Units Subcutaneous Q4H   latanoprost  1 drop Both Eyes QHS   levothyroxine  150 mcg Oral QAC breakfast   pantoprazole (PROTONIX) IV  40 mg Intravenous Q12H   sildenafil  20 mg Oral 3 times per day   sodium chloride flush  3 mL Intravenous Q12H   Continuous Infusions:  sodium chloride     PRN Meds:.sodium chloride, acetaminophen **OR** acetaminophen, hydrOXYzine, sodium chloride flush  Allergies as of 05/13/2021 - Review Complete 05/13/2021  Allergen Reaction Noted   Lactose intolerance (gi) Diarrhea 09/27/2019   Morphine and related Itching 12/13/2020   Sulfa antibiotics Rash and Other (See Comments) 02/01/2016    Family History  Problem Relation Age of Onset   Hypertension Mother    Diabetes Mother    Cervical cancer Mother    Heart attack Father    Hypertension Sister    Hypertension  Brother    Hypertension Sister    Hypertension Sister    Prostate cancer Brother    HIV/AIDS Brother     Social History   Socioeconomic History   Marital status: Married    Spouse name: Not on file   Number of children: Not on file   Years of education: Not on file   Highest education level: Not on file  Occupational History   Occupation: retired  Tobacco Use   Smoking status: Former    Packs/day: 0.25    Years: 20.00    Pack years: 5.00    Types: Cigarettes  Quit date: 32    Years since quitting: 25.8   Smokeless tobacco: Former  Scientific laboratory technician Use: Never used  Substance and Sexual Activity   Alcohol use: Yes    Alcohol/week: 1.0 standard drink    Types: 1 Standard drinks or equivalent per week    Comment: occ   Drug use: Never   Sexual activity: Not on file  Other Topics Concern   Not on file  Social History Narrative   Not on file   Social Determinants of Health   Financial Resource Strain: Low Risk    Difficulty of Paying Living Expenses: Not hard at all  Food Insecurity: No Food Insecurity   Worried About Charity fundraiser in the Last Year: Never true   [place] in the Last Year: Never true  Transportation Needs: No Transportation Needs   Lack of Transportation (Medical): No   Lack of Transportation (Non-Medical): No  Physical Activity: Inactive   Days of Exercise per Week: 0 days   Minutes of Exercise per Session: 0 min  Stress: No Stress Concern Present   Feeling of Stress : Not at all  Social Connections: Not on file  Intimate Partner Violence: Not on file    Review of Systems: All negative except as stated above in HPI.  Physical Exam: Vital signs: Vitals:   05/14/21 0630 05/14/21 0715  BP: (!) 204/46 (!) 185/64  Pulse:  68  Resp: 15 16  Temp:  98.5 F (36.9 C)  SpO2:  100%     General:   Alert,  Well-developed, well-nourished, pleasant and cooperative in NAD HEENT : Normocephalic, atraumatic, extraocular movement  intact.  No scleral icterus Lungs:  Clear throughout to auscultation.   No wheezes, crackles, or rhonchi. No acute distress. Heart:  Regular rate and rhythm; no murmurs, clicks, rubs,  or gallops. Abdomen: Soft, nontender, nondistended, bowel sounds present.  No peritoneal signs Neuro : Alert and oriented x3 Psych : Mood and affect normal Extremities : No lower extremity edema Rectal:  Deferred  GI:  Lab Results: Recent Labs    05/13/21 1253 05/13/21 2311 05/13/21 2315 05/14/21 0223  WBC 4.7 4.5  --  5.0  HGB 8.1* 7.5*  --  7.1*  HCT 23.2* 22.2*  --  21.5*  PLT 58* 156 163 156   BMET Recent Labs    05/13/21 1253 05/14/21 0223  NA 136 137  K 3.7 3.2*  CL 95* 95*  CO2 27 31  GLUCOSE 165* 97  BUN 52* 61*  CREATININE 4.13* 4.57*  CALCIUM 9.5 9.7   LFT Recent Labs    05/14/21 0223  PROT 6.1*  ALBUMIN 3.1*  AST 45*  ALT 30  ALKPHOS 102  BILITOT 1.4*   PT/INR Recent Labs    05/13/21 1330 05/13/21 2315  LABPROT 14.2 14.9  INR 1.1 1.2     Studies/Results: No results found.  Impression/Plan: -Nausea and vomiting with streaks of blood in the vomiting.  Resolved now.  No further vomiting. -Recurrent anemia.  Requiring blood transfusion. -GI bleed.  No further bleeding today. -History of MDS requiring multiple transfusions -End-stage renal disease on dialysis -History of colon cancer.   Recommendations ----------------------- -No further bleeding episodes today.  Had push enteroscopy in August 2022 which showed multiple AVMs.  Colonoscopy showed fair prep. -Her anemia is multifactorial. -Recommend supportive care for now.  If ongoing drop in hemoglobin, may consider repeating push enteroscopy  -Okay to have soft diet  today -Monitor H&H.  GI will follow    LOS: 0 days   Otis Brace  MD, FACP 05/14/2021, 8:36 AM  Contact #  [phone]

## 2021-05-14 NOTE — Evaluation (Signed)
Physical Therapy Evaluation Patient Details Name: Patricia Mata MRN: [id number] DOB: May 18, 1956 Today's Date: 05/14/2021  History of Present Illness  Pt is 65 yo female admitted on 05/13/21 with symptomatic anemia and has received 1 unit of PRBC.  Pt with hx of ESRD with HD on TTS, DM2, afib, CHF, CAD, aortic stenosis, hypothyroidism, and myelodysplastic syndrome.  Clinical Impression   Pt admitted with above diagnosis.  She demonstrates gait and transfers at baseline/independent level.  She was able to ambulate 160' total without AD.  Reports independent with ADLs. Declines further need for PT/OT.  PT agrees and will sign off.        Recommendations for follow up therapy are one component of a multi-disciplinary discharge planning process, led by the attending physician.  Recommendations may be updated based on patient status, additional functional criteria and insurance authorization.  Follow Up Recommendations No PT follow up    Assistance Recommended at Discharge PRN  Functional Status Assessment Patient has not had a recent decline in their functional status  Equipment Recommendations  None recommended by PT    Recommendations for Other Services       Precautions / Restrictions Precautions Precautions: None      Mobility  Bed Mobility Overal bed mobility: Independent                  Transfers Overall transfer level: Independent Equipment used: None               General transfer comment: Demonstrated sit to stand x 2, easily from high and low surfaces    Ambulation/Gait Ambulation/Gait assistance: Supervision Gait Distance (Feet): 100 Feet (60' then 100') Assistive device: None Gait Pattern/deviations: WFL(Within Functional Limits)     General Gait Details: Pt with good stability, increased lateral weight shift but steady and reports baseline; reports ambulating similar distance to home  Stairs            Wheelchair Mobility     Modified Rankin (Stroke Patients Only)       Balance Overall balance assessment: Independent   Sitting balance-Leahy Scale: Normal       Standing balance-Leahy Scale: Good                               Pertinent Vitals/Pain Pain Assessment: No/denies pain    Home Living Family/patient expects to be discharged to:: Private residence Living Arrangements: Spouse/significant other Available Help at Discharge: Family;Available 24 hours/day Type of Home: House Home Access: Stairs to enter Entrance Stairs-Rails: Left;Right;Can reach both Entrance Stairs-Number of Steps: 3   Home Layout: One level Home Equipment: Grab bars - tub/shower;Tub bench;Grab bars - toilet;Rolling Walker (2 wheels)      Prior Function Prior Level of Function : Independent/Modified Independent;Driving             Mobility Comments: Did not use AD; reports fatigue with long distance (states leaky heart valve), does not ambulate community distances ADLs Comments: Reports independent with ADLS and IADLs     Hand Dominance        Extremity/Trunk Assessment   Upper Extremity Assessment Upper Extremity Assessment: Overall WFL for tasks assessed    Lower Extremity Assessment Lower Extremity Assessment: Overall WFL for tasks assessed    Cervical / Trunk Assessment Cervical / Trunk Assessment: Normal  Communication   Communication: No difficulties  Cognition Arousal/Alertness: Awake/alert Behavior During Therapy: WFL for tasks assessed/performed Overall Cognitive Status: Within  Functional Limits for tasks assessed                                          General Comments General comments (skin integrity, edema, etc.): VSS on RA; denies any dizziness or weakness.  Pt reports independent with all ADLs (demonstrated leg cross for shoe donning).  She declines need for further PT/OT.    Exercises     Assessment/Plan    PT Assessment Patient does not need any  further PT services  PT Problem List         PT Treatment Interventions      PT Goals (Current goals can be found in the Care Plan section)  Acute Rehab PT Goals Patient Stated Goal: return home PT Goal Formulation: All assessment and education complete, DC therapy    Frequency     Barriers to discharge        Co-evaluation               AM-PAC PT "6 Clicks" Mobility  Outcome Measure Help needed turning from your back to your side while in a flat bed without using bedrails?: None Help needed moving from lying on your back to sitting on the side of a flat bed without using bedrails?: None Help needed moving to and from a bed to a chair (including a wheelchair)?: None Help needed standing up from a chair using your arms (e.g., wheelchair or bedside chair)?: None Help needed to walk in hospital room?: None (had supervision b/c eval but performed safely) Help needed climbing 3-5 steps with a railing? : A Little 6 Click Score: 23    End of Session Equipment Utilized During Treatment: Gait belt Activity Tolerance: Patient tolerated treatment well Patient left: in bed;with call bell/phone within reach Nurse Communication: Mobility status PT Visit Diagnosis: Other abnormalities of gait and mobility (R26.89)    Time: 4967-5916 PT Time Calculation (min) (ACUTE ONLY): 15 min   Charges:   PT Evaluation $PT Eval Low Complexity: 1 Low          Diamond Martucci, PT Acute Rehab Services Pager [phone] [facility] 05/14/2021, 1:59 PM

## 2021-05-14 NOTE — ED Notes (Signed)
MD notified of pt bp of 172/45. Pt has no symptoms. Remains alert and oriented x 4. Dialysis pt. Will continue to monitor.

## 2021-05-14 NOTE — Progress Notes (Signed)
OT Cancellation Note  Patient Details Name: Patricia Mata MRN: [id number] DOB: [birth year]   Cancelled Treatment:    Reason Eval/Treat Not Completed: OT screened, no needs identified, will sign off. Received chat text from evaluating PT that pt is independent and pt reports she does not need any therapy.  [place], OTR/L Acute Rehab Services Pager [phone] Office [phone]    Almon Register 05/14/2021, 12:47 PM

## 2021-05-15 DIAGNOSIS — N186 End stage renal disease: Secondary | ICD-10-CM | POA: Diagnosis not present

## 2021-05-15 DIAGNOSIS — K922 Gastrointestinal hemorrhage, unspecified: Secondary | ICD-10-CM | POA: Diagnosis not present

## 2021-05-15 DIAGNOSIS — E1122 Type 2 diabetes mellitus with diabetic chronic kidney disease: Secondary | ICD-10-CM | POA: Diagnosis not present

## 2021-05-15 DIAGNOSIS — I132 Hypertensive heart and chronic kidney disease with heart failure and with stage 5 chronic kidney disease, or end stage renal disease: Secondary | ICD-10-CM | POA: Diagnosis not present

## 2021-05-15 DIAGNOSIS — E7800 Pure hypercholesterolemia, unspecified: Secondary | ICD-10-CM | POA: Diagnosis not present

## 2021-05-15 LAB — CBC
HCT: 23.7 % — ABNORMAL LOW (ref 36.0–46.0)
HCT: 26.2 % — ABNORMAL LOW (ref 36.0–46.0)
Hemoglobin: 8 g/dL — ABNORMAL LOW (ref 12.0–15.0)
Hemoglobin: 8.8 g/dL — ABNORMAL LOW (ref 12.0–15.0)
MCH: 29.9 pg (ref 26.0–34.0)
MCH: 29.7 pg (ref 26.0–34.0)
MCHC: 33.8 g/dL (ref 30.0–36.0)
MCHC: 33.6 g/dL (ref 30.0–36.0)
MCV: 88.4 fL (ref 80.0–100.0)
MCV: 88.5 fL (ref 80.0–100.0)
Platelets: 147 10*3/uL — ABNORMAL LOW (ref 150–400)
Platelets: 138 10*3/uL — ABNORMAL LOW (ref 150–400)
RBC: 2.68 MIL/uL — ABNORMAL LOW (ref 3.87–5.11)
RBC: 2.96 MIL/uL — ABNORMAL LOW (ref 3.87–5.11)
RDW: 19.6 % — ABNORMAL HIGH (ref 11.5–15.5)
RDW: 19.8 % — ABNORMAL HIGH (ref 11.5–15.5)
WBC: 4.2 10*3/uL (ref 4.0–10.5)
WBC: 4.3 10*3/uL (ref 4.0–10.5)
nRBC: 0.5 % — ABNORMAL HIGH (ref 0.0–0.2)
nRBC: 0 % (ref 0.0–0.2)

## 2021-05-15 LAB — RENAL FUNCTION PANEL
Albumin: 3.2 g/dL — ABNORMAL LOW (ref 3.5–5.0)
Anion gap: 11 (ref 5–15)
BUN: 27 mg/dL — ABNORMAL HIGH (ref 8–23)
CO2: 28 mmol/L (ref 22–32)
Calcium: 8.8 mg/dL — ABNORMAL LOW (ref 8.9–10.3)
Chloride: 98 mmol/L (ref 98–111)
Creatinine, Ser: 2.46 mg/dL — ABNORMAL HIGH (ref 0.44–1.00)
GFR, Estimated: 21 mL/min — ABNORMAL LOW (ref >60)
Glucose, Bld: 109 mg/dL — ABNORMAL HIGH (ref 70–99)
Phosphorus: 2.4 mg/dL — ABNORMAL LOW (ref 2.5–4.6)
Potassium: 4 mmol/L (ref 3.5–5.1)
Sodium: 137 mmol/L (ref 135–145)

## 2021-05-15 LAB — BPAM RBC
Blood Product Expiration Date: 202211212359
ISSUE DATE / TIME: 202210310439
Unit Type and Rh: 600

## 2021-05-15 LAB — TYPE AND SCREEN
ABO/RH(D): A NEG
Antibody Screen: NEGATIVE
Unit division: 0

## 2021-05-15 LAB — BASIC METABOLIC PANEL
Anion gap: 9 (ref 5–15)
BUN: 75 mg/dL — ABNORMAL HIGH (ref 8–23)
CO2: 28 mmol/L (ref 22–32)
Calcium: 9.5 mg/dL (ref 8.9–10.3)
Chloride: 98 mmol/L (ref 98–111)
Creatinine, Ser: 5.22 mg/dL — ABNORMAL HIGH (ref 0.44–1.00)
GFR, Estimated: 9 mL/min — ABNORMAL LOW (ref >60)
Glucose, Bld: 120 mg/dL — ABNORMAL HIGH (ref 70–99)
Potassium: 3.9 mmol/L (ref 3.5–5.1)
Sodium: 135 mmol/L (ref 135–145)

## 2021-05-15 LAB — HAPTOGLOBIN: Haptoglobin: 35 mg/dL — ABNORMAL LOW (ref 37–355)

## 2021-05-15 LAB — HEPATITIS B SURFACE ANTIGEN: Hepatitis B Surface Ag: NONREACTIVE

## 2021-05-15 LAB — GLUCOSE, CAPILLARY
Glucose-Capillary: 111 mg/dL — ABNORMAL HIGH (ref 70–99)
Glucose-Capillary: 104 mg/dL — ABNORMAL HIGH (ref 70–99)
Glucose-Capillary: 152 mg/dL — ABNORMAL HIGH (ref 70–99)

## 2021-05-15 LAB — HEPATITIS B SURFACE ANTIBODY,QUALITATIVE: Hep B S Ab: REACTIVE — AB

## 2021-05-15 MED ORDER — POLYETHYLENE GLYCOL 3350 17 G PO PACK
17.0000 g | PACK | Freq: Every day | ORAL | Status: DC
  Administered 2021-05-15: 17 g via ORAL
  Filled 2021-05-15: qty 1

## 2021-05-15 MED ORDER — OMEPRAZOLE 40 MG PO CPDR: 40.0000 mg | DELAYED_RELEASE_CAPSULE | Freq: Two times a day (BID) | ORAL | Status: DC

## 2021-05-15 NOTE — Progress Notes (Signed)
DISCHARGE NOTE Patricia Mata Careen Mauch to be discharged Patricia Mata per MD order. Discussed prescriptions and follow up appointments with the patient. Prescriptions given to patient; medication list explained in detail. Patient verbalized understanding.  Skin clean, dry and intact without evidence of skin break down, no evidence of skin tears noted. IV catheter discontinued intact. Site without signs and symptoms of complications. Dressing and pressure applied. Pt denies pain at the site currently. No complaints noted.  Patient free of lines, drains, and wounds.   An After Visit Summary (AVS) was printed and given to the patient. Patient escorted via wheelchair, and discharged Patricia Mata via private auto.  Dolores Hoose, RN

## 2021-05-15 NOTE — Progress Notes (Signed)
PROGRESS NOTE  Patricia Mata GMW:[id number] DOB: [birth year] DOA: 05/13/2021 PCP: [name], FNP   LOS: 0 days   Brief narrative:  Patricia Mata is a 65 y.o. female with past medical history of end-stage renal disease on hemodialysis Tuesday Thursday Saturday, diabetes mellitus type 2, atrial fibrillation, history of chronic diastolic heart failure, CAD, aortic stenosis, hypothyroidism and myelodysplastic syndrome presented to hospital with nausea vomiting with black loose stools.  In the ED, patient was noted to hemodynamically stable.  Hemoglobin was 8.1.  Of note patient was seen in the ER on 05/07/2021 and had received units of packed RBC for hemoglobin of 7.7 at that time.  At this time, patient has been admitted hospital for further evaluation and treatment.  Assessment/Plan:  Active Problems:   Anemia due to chronic kidney disease   MDS (myelodysplastic syndrome) (HCC)   Hyperlipidemia   Paroxysmal atrial fibrillation (HCC)   Iron deficiency anemia due to chronic blood loss   Hypothyroidism   Pulmonary hypertension (HCC)   DM2 (diabetes mellitus, type 2) (HCC)   Symptomatic anemia   Thrombocytopenia (HCC)   Symptomatic anemia   Hemoglobin of 8.0 today.  Patient did have a recent EGD colonoscopy and was seen by GI in the past.  GI on board.  Has received 1 unit of packed RBC.  On 4 push enteroscopy if her hemoglobin trends down but currently hemoglobin stable.  Patient denies any further bowel movements..  We will continue to monitor.  Follow GI recommendations.   Anemia due to chronic kidney disease and ongoing GI loss.  We will continue to monitor CBC.   Hyperlipidemia Continue Lipitor.     MDS/thrombocytopenia.  Follows up with oncology as outpatient.  Essential  Hypertension.  On Coreg and hydralazine.  We will continue for now.  Diabetes mellitus type 2.   Patient takes insulin glargine 20 units daily as needed.  We will continue with sliding scale  insulin while in hospital.  On linagliptin at home.   Paroxysmal atrial fibrillation Patient is not on anticoagulation due to recurrent GI bleed.  Continue amiodarone.  Will resume Coreg.   Pulmonary hypertension  Patient is on sildenafil from home.  Continue.   Hypothyroidism - Continue Synthroid.  Abnormal urinalysis.  No overt urinary symptoms.  We will hold off with antibiotic.  ESRD -nephrology on board for hemodialysis.  Receiving hemodialysis today.  DVT prophylaxis: SCDs Start: 05/14/21 0034   Code Status: Full code  Family Communication:  Spoke with the patient's family at bedside on 05/14/2021  Status is: Observation  The patient will require care spanning > 2 midnights and should be moved to inpatient because: Further monitoring, need for hemodialysis, possible need for endoscopic evaluation  Consultants: GI Nephrology  Procedures: PRBC transfusion 1 unit Hemodialysis  Anti-infectives:  None  Anti-infectives (From admission, onward)    None       Subjective: Today, patient was seen and examined at bedside.  Denies any nausea vomiting or abdominal pain.  Has not had a bowel movement.  Objective: Vitals:   05/15/21 1030 05/15/21 1100  BP: (!) 94/47 (!) 131/56  Pulse: 66 (!) 52  Resp: 14 18  Temp:    SpO2:      Intake/Output Summary (Last 24 hours) at 05/15/2021 1207 Last data filed at 05/15/2021 0800 Gross per 24 hour  Intake 0 ml  Output --  Net 0 ml    Filed Weights   05/13/21 1236 05/14/21 2008 05/15/21 0745  Weight:  86.2 kg 87.9 kg 88.4 kg   Body mass index is 31.46 kg/m.   Physical Exam: GENERAL: Patient is alert awake and oriented. Not in obvious distress.  Obese HENT: Mild pallor noted, pupils equally reactive to light. Oral mucosa is moist NECK: is supple, no gross swelling noted. CHEST: Clear to auscultation. No crackles or wheezes.  Diminished breath sounds bilaterally. CVS: S1 and S2 heard, no murmur. Regular rate and  rhythm.  ABDOMEN: Soft, non-tender, bowel sounds are present. EXTREMITIES: No edema. CNS: Left facial palsy chronic -no other focal motor deficits. SKIN: warm and dry without rashes.  Data Review: I have personally reviewed the following laboratory data and studies,  CBC: Recent Labs  Lab 05/09/21 1053 05/13/21 1253 05/13/21 2311 05/13/21 2315 05/14/21 0223 05/15/21 0449  WBC 5.0 4.7 4.5  --  5.0 4.2  NEUTROABS 3.1 3.1  --   --  3.4  --   HGB 7.7* 8.1* 7.5*  --  7.1* 8.0*  HCT 22.2* 23.2* 22.2*  --  21.5* 23.7*  MCV 90.6 87.2 87.1  --  88.1 88.4  PLT 235 58* 156 163 156 147*    Basic Metabolic Panel: Recent Labs  Lab 05/13/21 1253 05/14/21 0223 05/15/21 0449  NA 136 137 135  K 3.7 3.2* 3.9  CL 95* 95* 98  CO2 27 31 28   GLUCOSE 165* 97 120*  BUN 52* 61* 75*  CREATININE 4.13* 4.57* 5.22*  CALCIUM 9.5 9.7 9.5  MG  --  2.0  --   PHOS  --  5.0*  --     Liver Function Tests: Recent Labs  Lab 05/13/21 1253 05/14/21 0223  AST 54* 45*  ALT 35 30  ALKPHOS 104 102  BILITOT 1.4* 1.4*  PROT 6.3* 6.1*  ALBUMIN 3.3* 3.1*    Recent Labs  Lab 05/13/21 1253  LIPASE 27    No results for input(s): AMMONIA in the last 168 hours. Cardiac Enzymes: No results for input(s): CKTOTAL, CKMB, CKMBINDEX, TROPONINI in the last 168 hours. BNP (last 3 results) Recent Labs    06/07/20 0826 07/06/20 1733 11/21/20 1102  BNP 489.2* 932.6* 1,054.4*     ProBNP (last 3 results) No results for input(s): PROBNP in the last 8760 hours.  CBG: Recent Labs  Lab 05/14/21 1556 05/14/21 1946 05/14/21 2010 05/14/21 2312 05/15/21 0431  GLUCAP 177* 157* 175* 127* 111*    Recent Results (from the past 240 hour(s))  Resp Panel by RT-PCR (Flu A&B, Covid) Nasopharyngeal Swab     Status: None   Collection Time: 05/13/21  6:36 PM   Specimen: Nasopharyngeal Swab; Nasopharyngeal(NP) swabs in vial transport medium  Result Value Ref Range Status   SARS Coronavirus 2 by RT PCR NEGATIVE  NEGATIVE Final    Comment: (NOTE) SARS-CoV-2 target nucleic acids are NOT DETECTED.  The SARS-CoV-2 RNA is generally detectable in upper respiratory specimens during the acute phase of infection. The lowest concentration of SARS-CoV-2 viral copies this assay can detect is 138 copies/mL. A negative result does not preclude SARS-Cov-2 infection and should not be used as the sole basis for treatment or other patient management decisions. A negative result may occur with  improper specimen collection/handling, submission of specimen other than nasopharyngeal swab, presence of viral mutation(s) within the areas targeted by this assay, and inadequate number of viral copies(<138 copies/mL). A negative result must be combined with clinical observations, patient history, and epidemiological information. The expected result is Negative.  Fact Sheet for Patients:  EntrepreneurPulse.com.au  Fact Sheet for Healthcare Providers:  IncredibleEmployment.be  This test is no t yet approved or cleared by the [place] FDA and  has been authorized for detection and/or diagnosis of SARS-CoV-2 by FDA under an Emergency Use Authorization (EUA). This EUA will remain  in effect (meaning this test can be used) for the duration of the COVID-19 declaration under Section 564(b)(1) of the Act, 21 U.S.C.section 360bbb-3(b)(1), unless the authorization is terminated  or revoked sooner.       Influenza A by PCR NEGATIVE NEGATIVE Final   Influenza B by PCR NEGATIVE NEGATIVE Final    Comment: (NOTE) The Xpert Xpress SARS-CoV-2/FLU/RSV plus assay is intended as an aid in the diagnosis of influenza from Nasopharyngeal swab specimens and should not be used as a sole basis for treatment. Nasal washings and aspirates are unacceptable for Xpert Xpress SARS-CoV-2/FLU/RSV testing.  Fact Sheet for Patients: EntrepreneurPulse.com.au  Fact Sheet for Healthcare  Providers: IncredibleEmployment.be  This test is not yet approved or cleared by the [place] FDA and has been authorized for detection and/or diagnosis of SARS-CoV-2 by FDA under an Emergency Use Authorization (EUA). This EUA will remain in effect (meaning this test can be used) for the duration of the COVID-19 declaration under Section 564(b)(1) of the Act, 21 U.S.C. section 360bbb-3(b)(1), unless the authorization is terminated or revoked.  Performed at [facility], [place] [address], [place], [place] [address]       Studies: No results found.  Flora Lipps, MD  [facility] 05/15/2021  If 7PM-7AM, please contact night-coverage

## 2021-05-15 NOTE — Care Management Obs Status (Signed)
[facility] NOTIFICATION   Patient Details  Name: Catricia Scheerer MRN: [id number] Date of Birth: [birth year]   Medicare Observation Status Notification Given:  Yes    Tom-Johnson, Renea Ee, RN 05/15/2021, 10:21 AM

## 2021-05-15 NOTE — Plan of Care (Signed)
  Problem: Education: Goal: Knowledge of General Education information will improve Description: Including pain rating scale, medication(s)/side effects and non-pharmacologic comfort measures Outcome: Progressing   Problem: Clinical Measurements: Goal: Diagnostic test results will improve Outcome: Progressing Goal: Respiratory complications will improve Outcome: Progressing   Problem: Elimination: Goal: Will not experience complications related to bowel motility Outcome: Progressing   Problem: Education: Goal: Knowledge of disease and its progression will improve Outcome: Progressing   Problem: Fluid Volume: Goal: Compliance with measures to maintain balanced fluid volume will improve Outcome: Progressing

## 2021-05-15 NOTE — Progress Notes (Signed)
[facility] Progress Note   Subjective:     Objective Vitals:   05/15/21 0830 05/15/21 0900 05/15/21 0930 05/15/21 1000  BP: (!) 166/54 (!) 153/49 (!) 148/51 (!) 147/58  Pulse:   65 73  Resp: 14 17 16 16   Temp:      TempSrc:      SpO2:      Weight:      Height:       Physical Exam General: Heart: Lungs: Abdomen: Extremities: Dialysis Access:   Dialysis Orders:  Additional Objective Labs: Basic Metabolic Panel: Recent Labs  Lab 05/13/21 1253 05/14/21 0223 05/15/21 0449  NA 136 137 135  K 3.7 3.2* 3.9  CL 95* 95* 98  CO2 27 31 28   GLUCOSE 165* 97 120*  BUN 52* 61* 75*  CREATININE 4.13* 4.57* 5.22*  CALCIUM 9.5 9.7 9.5  PHOS  --  5.0*  --    Liver Function Tests: Recent Labs  Lab 05/13/21 1253 05/14/21 0223  AST 54* 45*  ALT 35 30  ALKPHOS 104 102  BILITOT 1.4* 1.4*  PROT 6.3* 6.1*  ALBUMIN 3.3* 3.1*   Recent Labs  Lab 05/13/21 1253  LIPASE 27   CBC: Recent Labs  Lab 05/09/21 1053 05/13/21 1253 05/13/21 2311 05/13/21 2315 05/14/21 0223 05/15/21 0449  WBC 5.0 4.7 4.5  --  5.0 4.2  NEUTROABS 3.1 3.1  --   --  3.4  --   HGB 7.7* 8.1* 7.5*  --  7.1* 8.0*  HCT 22.2* 23.2* 22.2*  --  21.5* 23.7*  MCV 90.6 87.2 87.1  --  88.1 88.4  PLT 235 58* 156 163 156 147*   Blood Culture    Component Value Date/Time   SDES  09/24/2019 1144    TRACHEAL ASPIRATE Performed at [facility], [place] [address], [place], [place] [address]    SPECREQUEST  09/24/2019 1144    NONE Performed at [facility], [place] [address], [place], [place] [address]    CULT FEW CANDIDA ALBICANS 09/24/2019 1144   REPTSTATUS 09/27/2019 FINAL 09/24/2019 1144    Cardiac Enzymes: No results for input(s): CKTOTAL, CKMB, CKMBINDEX, TROPONINI in the last 168 hours. CBG: Recent Labs  Lab 05/14/21 1556 05/14/21 1946 05/14/21 2010 05/14/21 2312 05/15/21 0431  GLUCAP 177* 157* 175* 127* 111*   Iron Studies:  Recent Labs     05/14/21 0223  IRON 204*  TIBC 209*  FERRITIN 2,121*   @lablastinr3 @ Studies/Results: No results found. Medications:  sodium chloride     sodium chloride     sodium chloride      allopurinol  300 mg Oral q morning   amiodarone  200 mg Oral q morning   atorvastatin  40 mg Oral q morning   carvedilol  6.25 mg Oral BID WC   Chlorhexidine Gluconate Cloth  6 each Topical Q0600   [START ON 05/17/2021] darbepoetin (ARANESP) injection - DIALYSIS  200 mcg Intravenous Q Thu-HD   hydrALAZINE  100 mg Oral TID   insulin aspart  0-6 Units Subcutaneous Q4H   latanoprost  1 drop Both Eyes QHS   levothyroxine  150 mcg Oral QAC breakfast   pantoprazole (PROTONIX) IV  40 mg Intravenous Q12H   sildenafil  20 mg Oral 3 times per day   sodium chloride flush  3 mL Intravenous Q12H     Dialysis Orders: [facility] T,Th,S 4 hrs 180NRe 400/Autoflow 1.5 87.4 kg 2.0K/2.0 Ca AVF -Heparin 2000 units IV TIW -Mircera  200 mcg IV q 2 weeks (last dose 05/03/2021) -Calcitriol 0.25 mcg PO TIW     Assessment/Plan:  Symptomatic Anemia: S/P 1 unit PRBCS. GI consulted. H/O AVMS. Supportive care for now. Per primary. HGB 8.0 this AM. NPO but no orders for endo yet.  Hypokalemia: Use 4.0 K bath. K+ ^3.9 today H/O PAF RVR-not on anticoagulation. Per primary  MDS-followed by oncology. Per primary  ESRD -  T,Th,S.HD today on schedule.   Hypertension/volume  - No evidence of overt volume overload. Has been getting to EDW, BP variable control. UF as tolerated.   Anemia  - As noted above. ESA due 05/17/2021. Transfuse as needed.   Metabolic bone disease - OP labs at goal. No binders on medication list. Continue VDRA  Nutrition - soft diet at present. Advance to renal/carb mod diet. Albumin 3.3 Add protein supps.  DMT2 per primary H/O Pulmonary HTN per primary  Patricia Mata H. Bradi Arbuthnot NP-C 05/15/2021, 10:09 AM  [name] [phone]

## 2021-05-15 NOTE — Discharge Summary (Signed)
Physician Discharge Summary  Patricia Mata ENM:[id number] DOB: November 12, 1955 DOA: 05/13/2021  PCP: [name], FNP  Admit date: 05/13/2021 Discharge date: 05/15/2021  Admitted From: Home  Discharge disposition: Home   Recommendations for Outpatient Follow-Up:   Follow up with your primary care provider in one week.  Check CBC, BMP, magnesium in the next visit Follow-up with hematology as has been scheduled  Discharge Diagnosis:   Active Problems:   Anemia due to chronic kidney disease   MDS (myelodysplastic syndrome) (HCC)   Hyperlipidemia   Paroxysmal atrial fibrillation (HCC)   Iron deficiency anemia due to chronic blood loss   Hypothyroidism   Pulmonary hypertension (HCC)   DM2 (diabetes mellitus, type 2) (HCC)   Symptomatic anemia   Thrombocytopenia (HCC) Recurrent GI bleed  Discharge Condition: Improved.  Diet recommendation: Low sodium, heart healthy.  Carbohydrate-modified.    Wound care: None.  Code status: Full.   History of Present Illness:   Patricia Mata is a 65 y.o. female with past medical history of end-stage renal disease on hemodialysis Tuesday, Thursday Saturday, diabetes mellitus type 2, atrial fibrillation, history of chronic diastolic heart failure, CAD, aortic stenosis, hypothyroidism and myelodysplastic syndrome presented to hospital with nausea, vomiting with black loose stools.  In the ED, patient was noted to hemodynamically stable.  Hemoglobin was 8.1.  Of note patient was seen in the ER on 05/07/2021 and had received units of packed RBC for hemoglobin of 7.7 at that time.  At this time, patient was admitted hospital for further evaluation and treatment.  Hospital Course:   Following conditions were addressed during hospitalization as listed below,  Symptomatic anemia   Hemoglobin of 8.0 today.  Patient did have a recent EGD colonoscopy and was seen by GI in the past.  GI followed the patient during this hospitalization.   Patient received 1 unit of packed RBC.  Patient did not have further bowel movements and hemoglobin remained stable so GI has seen the patient today and recommend no further intervention.      Anemia due to chronic kidney disease and ongoing GI loss.  We will need to monitor periodically as outpatient  Hyperlipidemia Continue Lipitor.     MDS/thrombocytopenia.  Follows up with oncology as outpatient.  Patient does have an appointment follow tomorrow.   Essential  Hypertension.  On Coreg and hydralazine.  Continued on discharge.   Diabetes mellitus type 2.   Patient takes insulin glargine 20 units daily as needed and linagliptin at home.    Paroxysmal atrial fibrillation Patient is not on anticoagulation due to recurrent GI bleed, history of myelodysplastic syndrome..  Continue amiodarone, Coreg.   Pulmonary hypertension  Patient is on sildenafil at home to be continued   Hypothyroidism - Continue Synthroid.   Abnormal urinalysis.  No overt urinary symptoms.  Antibiotic was not prescribed.   ESRD -she was seen by nephrology and underwent hemodialysis in the hospital.    Disposition.  At this time, patient is stable for disposition home with outpatient PCP heme-onc follow-up.  Medical Consultants:   GI Nephrology  Procedures:    PRBC transfusion Hemodialysis  Subjective:   Today, patient was seen and examined at bedside.  Denies any nausea, vomiting or abdominal pain.  No further blood in stool.  Discharge Exam:   Vitals:   05/15/21 1207 05/15/21 1622  BP: (!) 163/45 (!) 112/58  Pulse: 63 74  Resp: 17 18  Temp: 98.3 F (36.8 C) 98 F (36.7 C)  SpO2:  100% 100%   Vitals:   05/15/21 1115 05/15/21 1120 05/15/21 1207 05/15/21 1622  BP: (!) 152/55 (!) 168/53 (!) 163/45 (!) 112/58  Pulse: 64 63 63 74  Resp: _0 Temp: 98.4 F (36.9 C) 98.4 F (36.9 C) 98.3 F (36.8 C) 98 F (36.7 C)  TempSrc:   Oral   SpO2:  100% 100% 100%  Weight:  86.4 kg     Height:       GENERAL: Patient is alert awake and oriented. Not in obvious distress.  Obese HENT: Mild pallor noted, pupils equally reactive to light. Oral mucosa is moist NECK: is supple, no gross swelling noted. CHEST: Clear to auscultation. No crackles or wheezes.  Diminished breath sounds bilaterally. CVS: S1 and S2 heard, no murmur. Regular rate and rhythm.  ABDOMEN: Soft, non-tender, bowel sounds are present. EXTREMITIES: No edema. CNS: Left facial palsy chronic -no other focal motor deficits. SKIN: warm and dry without rashes.  The results of significant diagnostics from this hospitalization (including imaging, microbiology, ancillary and laboratory) are listed below for reference.     Diagnostic Studies:   No results found.   Labs:   Basic Metabolic Panel: Recent Labs  Lab 05/13/21 1253 05/14/21 0223 05/15/21 0449 05/15/21 1223  NA 136 137 135 137  K 3.7 3.2* 3.9 4.0  CL 95* 95* 98 98  CO2 _1 GLUCOSE 165* 97 120* 109*  BUN 52* 61* 75* 27*  CREATININE 4.13* 4.57* 5.22* 2.46*  CALCIUM 9.5 9.7 9.5 8.8*  MG  --  2.0  --   --   PHOS  --  5.0*  --  2.4*   GFR Estimated Creatinine Clearance: 24.9 mL/min (A) (by C-G formula based on SCr of 2.46 mg/dL (H)). Liver Function Tests: Recent Labs  Lab 05/13/21 1253 05/14/21 0223 05/15/21 1223  AST 54* 45*  --   ALT 35 30  --   ALKPHOS 104 102  --   BILITOT 1.4* 1.4*  --   PROT 6.3* 6.1*  --   ALBUMIN 3.3* 3.1* 3.2*   Recent Labs  Lab 05/13/21 1253  LIPASE 27   No results for input(s): AMMONIA in the last 168 hours. Coagulation profile Recent Labs  Lab 05/13/21 1330 05/13/21 2315  INR 1.1 1.2    CBC: Recent Labs  Lab 05/09/21 1053 05/09/21 1053 05/13/21 1253 05/13/21 2311 05/13/21 2315 05/14/21 0223 05/15/21 0449 05/15/21 1223  WBC 5.0  --  4.7 4.5  --  5.0 4.2 4.3  NEUTROABS 3.1  --  3.1  --   --  3.4  --   --   HGB 7.7*  --  8.1* 7.5*  --  7.1* 8.0* 8.8*  HCT 22.2*  --  23.2*  22.2*  --  21.5* 23.7* 26.2*  MCV 90.6  --  87.2 87.1  --  88.1 88.4 88.5  PLT 235   < > 58* 156 163 156 147* 138*   < > = values in this interval not displayed.   Cardiac Enzymes: No results for input(s): CKTOTAL, CKMB, CKMBINDEX, TROPONINI in the last 168 hours. BNP: Invalid input(s): POCBNP CBG: Recent Labs  Lab 05/14/21 2010 05/14/21 2312 05/15/21 0431 05/15/21 1207 05/15/21 1603  GLUCAP 175* 127* 111* 104* 152*   D-Dimer Recent Labs    05/13/21 2315  DDIMER 0.67*   Hgb A1c Recent Labs    05/13/21 1912  HGBA1C 6.5*   Lipid Profile No results for input(s):  CHOL, HDL, LDLCALC, TRIG, CHOLHDL, LDLDIRECT in the last 72 hours. Thyroid function studies Recent Labs    05/14/21 0223  TSH 2.058   Anemia work up Recent Labs    05/13/21 2314 05/14/21 0223  VITAMINB12  --  1,155*  FOLATE  --  15.7  FERRITIN  --  2,121*  TIBC  --  209*  IRON  --  204*  RETICCTPCT 2.2 2.7   Microbiology Recent Results (from the past 240 hour(s))  Resp Panel by RT-PCR (Flu A&B, Covid) Nasopharyngeal Swab     Status: None   Collection Time: 05/13/21  6:36 PM   Specimen: Nasopharyngeal Swab; Nasopharyngeal(NP) swabs in vial transport medium  Result Value Ref Range Status   SARS Coronavirus 2 by RT PCR NEGATIVE NEGATIVE Final    Comment: (NOTE) SARS-CoV-2 target nucleic acids are NOT DETECTED.  The SARS-CoV-2 RNA is generally detectable in upper respiratory specimens during the acute phase of infection. The lowest concentration of SARS-CoV-2 viral copies this assay can detect is 138 copies/mL. A negative result does not preclude SARS-Cov-2 infection and should not be used as the sole basis for treatment or other patient management decisions. A negative result may occur with  improper specimen collection/handling, submission of specimen other than nasopharyngeal swab, presence of viral mutation(s) within the areas targeted by this assay, and inadequate number of viral copies(<138  copies/mL). A negative result must be combined with clinical observations, patient history, and epidemiological information. The expected result is Negative.  Fact Sheet for Patients:  EntrepreneurPulse.com.au  Fact Sheet for Healthcare Providers:  IncredibleEmployment.be  This test is no t yet approved or cleared by the [place] FDA and  has been authorized for detection and/or diagnosis of SARS-CoV-2 by FDA under an Emergency Use Authorization (EUA). This EUA will remain  in effect (meaning this test can be used) for the duration of the COVID-19 declaration under Section 564(b)(1) of the Act, 21 U.S.C.section 360bbb-3(b)(1), unless the authorization is terminated  or revoked sooner.       Influenza A by PCR NEGATIVE NEGATIVE Final   Influenza B by PCR NEGATIVE NEGATIVE Final    Comment: (NOTE) The Xpert Xpress SARS-CoV-2/FLU/RSV plus assay is intended as an aid in the diagnosis of influenza from Nasopharyngeal swab specimens and should not be used as a sole basis for treatment. Nasal washings and aspirates are unacceptable for Xpert Xpress SARS-CoV-2/FLU/RSV testing.  Fact Sheet for Patients: EntrepreneurPulse.com.au  Fact Sheet for Healthcare Providers: IncredibleEmployment.be  This test is not yet approved or cleared by the [place] FDA and has been authorized for detection and/or diagnosis of SARS-CoV-2 by FDA under an Emergency Use Authorization (EUA). This EUA will remain in effect (meaning this test can be used) for the duration of the COVID-19 declaration under Section 564(b)(1) of the Act, 21 U.S.C. section 360bbb-3(b)(1), unless the authorization is terminated or revoked.  Performed at [facility], [employer] [address] [place], [place] [address]      Discharge Instructions:   Discharge Instructions     Diet Carb Modified   Complete by: As directed    Discharge  instructions   Complete by: As directed    Follow up with your primary care provider in 1 week.  Follow-up with hematology as scheduled tomorrow.  Check blood work in the next visit.  Seek medical attention for worsening symptoms including ongoing bleeding.   Increase activity slowly   Complete by: As directed    No wound care  Complete by: As directed       Allergies as of 05/15/2021       Reactions   Lactose Intolerance (gi) Diarrhea   Morphine And Related Itching   Sulfa Antibiotics Rash, Other (See Comments)   Blisters, also        Medication List     TAKE these medications    acetaminophen 500 MG tablet Commonly known as: TYLENOL Take 1,000 mg by mouth every 6 (six) hours as needed (pain).   allopurinol 300 MG tablet Commonly known as: ZYLOPRIM Take 300 mg by mouth every morning.   amiodarone 200 MG tablet Commonly known as: PACERONE Take 1 tablet by mouth once daily What changed:  how much to take how to take this when to take this additional instructions   atorvastatin 40 MG tablet Commonly known as: LIPITOR Take 1 tablet by mouth once daily What changed: when to take this   Basaglar KwikPen 100 UNIT/ML Inject 20 Units into the skin daily as needed (only if blood sugar over 150).   blood glucose meter kit and supplies Dispense based on patient and insurance preference. Use up to four times daily as directed. (FOR ICD-10 E10.9, E11.9).   carvedilol 6.25 MG tablet Commonly known as: COREG Take 1 tablet (6.25 mg total) by mouth 2 (two) times daily with a meal. What changed:  when to take this additional instructions   diphenhydrAMINE 25 MG tablet Commonly known as: BENADRYL Take 25 mg by mouth every 8 (eight) hours as needed for itching.   ethyl chloride spray Apply 1 application topically See admin instructions. Apply topically to dialysis site if there is no time to use emla cream   glucose blood test strip check blood sugars twice daily Dx  code E11.22   hydrALAZINE 100 MG tablet Commonly known as: APRESOLINE Take 1 tablet (100 mg total) by mouth 3 (three) times daily. What changed:  when to take this additional instructions   Insulin Pen Needle 29G X 5MM Misc Use as directed   latanoprost 0.005 % ophthalmic solution Commonly known as: XALATAN Place 1 drop into both eyes at bedtime.   levothyroxine 150 MCG tablet Commonly known as: SYNTHROID TAKE 1 TABLET BY MOUTH ONCE DAILY BEFORE BREAKFAST   lidocaine-prilocaine cream Commonly known as: EMLA Apply 1 application topically See admin instructions. Apply to dialysis site 30 minutes before arriving at dialysis on Tuesday, Thursday, Saturday   linagliptin 5 MG Tabs tablet Commonly known as: Tradjenta Take 1 tablet (5 mg total) by mouth daily.   MIRCERA IJ Mircera   multivitamin with minerals Tabs tablet Take 1 tablet by mouth every morning. Centrum Silver 50+   omeprazole 40 MG capsule Commonly known as: PRILOSEC Take 1 capsule (40 mg total) by mouth 2 (two) times daily with a meal.   OneTouch Delica Plus [id number] Misc check blood sugars twice daily Dx code: E11.22   OneTouch UltraLink w/Device Kit Check blood sugars twice daily E11.9   polyethylene glycol 17 g packet Commonly known as: MIRALAX / GLYCOLAX Take 17 g by mouth daily as needed for moderate constipation.   sildenafil 20 MG tablet Commonly known as: REVATIO TAKE 1 TABLET BY MOUTH THREE TIMES DAILY What changed:  when to take this additional instructions        Follow-up Information     [name], FNP. Schedule an appointment as soon as possible for a visit.   Specialty: General Practice Contact information: [address] [place] [address]  [phone]         Lorretta Harp, MD .   Specialties: Cardiology, Radiology Contact information: [address] [place] [address] [phone]         Bensimhon, Shaune Pascal, MD .    Specialty: Cardiology Contact information: [place] [address] [phone]                  Time coordinating discharge: 39 minutes  Signed:  Brock Mokry  [facility] 05/15/2021, 5:07 PM

## 2021-05-15 NOTE — Progress Notes (Signed)
[facility] Gastroenterology Progress Note  Cyriah Childrey 65 y.o. [birth year]  CC: Recurrent anemia/recurrent GI bleed   Subjective: Patient seen and examined at bedside.  Feeling better today.  No further bleeding episodes.  No bowel movement today.  ROS : Febrile, negative for chest pain   Objective: Vital signs in last 24 hours: Vitals:   05/15/21 1120 05/15/21 1207  BP: (!) 168/53 (!) 163/45  Pulse: 63 63  Resp: 14 17  Temp: 98.4 F (36.9 C) 98.3 F (36.8 C)  SpO2: 100% 100%    Physical Exam:  General:  Alert, cooperative, no distress, appears stated age  Head:  Normocephalic, without obvious abnormality, atraumatic  Eyes:  , EOM's intact,   Lungs:   Clear to auscultation bilaterally, respirations unlabored  Heart:  Regular rate and rhythm,   Abdomen:   Soft, non-tender, nondistended, bowel sounds present          Lab Results: Recent Labs    05/14/21 0223 05/15/21 0449 05/15/21 1223  NA 137 135 137  K 3.2* 3.9 4.0  CL 95* 98 98  CO2 31 28 28   GLUCOSE 97 120* 109*  BUN 61* 75* 27*  CREATININE 4.57* 5.22* 2.46*  CALCIUM 9.7 9.5 8.8*  MG 2.0  --   --   PHOS 5.0*  --  2.4*   Recent Labs    05/13/21 1253 05/14/21 0223 05/15/21 1223  AST 54* 45*  --   ALT 35 30  --   ALKPHOS 104 102  --   BILITOT 1.4* 1.4*  --   PROT 6.3* 6.1*  --   ALBUMIN 3.3* 3.1* 3.2*   Recent Labs    05/13/21 1253 05/13/21 2311 05/14/21 0223 05/15/21 0449 05/15/21 1223  WBC 4.7   < > 5.0 4.2 4.3  NEUTROABS 3.1  --  3.4  --   --   HGB 8.1*   < > 7.1* 8.0* 8.8*  HCT 23.2*   < > 21.5* 23.7* 26.2*  MCV 87.2   < > 88.1 88.4 88.5  PLT 58*   < > 156 147* 138*   < > = values in this interval not displayed.   Recent Labs    05/13/21 1330 05/13/21 2315  LABPROT 14.2 14.9  INR 1.1 1.2      Assessment/Plan: -Nausea and vomiting with streaks of blood in the vomiting.  Resolved now.  No further vomiting. -Recurrent anemia.  Requiring blood transfusion.  Hemoglobin  stable now. -GI bleed.  No further bleeding yesterday  or today. -History of MDS requiring multiple transfusions -End-stage renal disease on dialysis -History of colon cancer.  -Constipation   Recommendations ----------------------- -Patient is feeling better now.  Hemoglobin stable.  No further bleeding episodes. -Add MiraLAX for constipation -Advance diet -No further inpatient GI work-up planned.  Okay to discharge from GI standpoint.  GI will sign off.  Call us back if needed   Otis Brace MD, [place] 05/15/2021, 1:31 PM  Contact #  [phone]

## 2021-05-16 ENCOUNTER — Inpatient Hospital Stay: Payer: Medicare Other

## 2021-05-16 ENCOUNTER — Other Ambulatory Visit: Payer: Self-pay | Admitting: *Deleted

## 2021-05-16 ENCOUNTER — Telehealth: Payer: Self-pay

## 2021-05-16 ENCOUNTER — Inpatient Hospital Stay: Payer: Medicare Other | Attending: Oncology

## 2021-05-16 ENCOUNTER — Other Ambulatory Visit: Payer: Self-pay

## 2021-05-16 VITALS — BP 173/45 | HR 59 | Temp 97.8°F | Resp 16 | Wt 191.0 lb

## 2021-05-16 DIAGNOSIS — D464 Refractory anemia, unspecified: Secondary | ICD-10-CM | POA: Diagnosis present

## 2021-05-16 DIAGNOSIS — D649 Anemia, unspecified: Secondary | ICD-10-CM

## 2021-05-16 DIAGNOSIS — D469 Myelodysplastic syndrome, unspecified: Secondary | ICD-10-CM

## 2021-05-16 DIAGNOSIS — Z23 Encounter for immunization: Secondary | ICD-10-CM | POA: Diagnosis not present

## 2021-05-16 LAB — PREPARE RBC (CROSSMATCH)

## 2021-05-16 LAB — CBC WITH DIFFERENTIAL (CANCER CENTER ONLY)
Abs Immature Granulocytes: 0.02 10*3/uL (ref 0.00–0.07)
Basophils Absolute: 0 10*3/uL (ref 0.0–0.1)
Basophils Relative: 0 %
Eosinophils Absolute: 0.1 10*3/uL (ref 0.0–0.5)
Eosinophils Relative: 1 %
HCT: 24.4 % — ABNORMAL LOW (ref 36.0–46.0)
Hemoglobin: 8.1 g/dL — ABNORMAL LOW (ref 12.0–15.0)
Immature Granulocytes: 0 %
Lymphocytes Relative: 13 %
Lymphs Abs: 0.7 10*3/uL (ref 0.7–4.0)
MCH: 29.7 pg (ref 26.0–34.0)
MCHC: 33.2 g/dL (ref 30.0–36.0)
MCV: 89.4 fL (ref 80.0–100.0)
Monocytes Absolute: 0.9 10*3/uL (ref 0.1–1.0)
Monocytes Relative: 17 %
Neutro Abs: 3.6 10*3/uL (ref 1.7–7.7)
Neutrophils Relative %: 69 %
Platelet Count: 147 10*3/uL — ABNORMAL LOW (ref 150–400)
RBC: 2.73 MIL/uL — ABNORMAL LOW (ref 3.87–5.11)
RDW: 20.1 % — ABNORMAL HIGH (ref 11.5–15.5)
WBC Count: 5.3 10*3/uL (ref 4.0–10.5)
nRBC: 0.4 % — ABNORMAL HIGH (ref 0.0–0.2)

## 2021-05-16 LAB — HEPATITIS B SURFACE ANTIBODY, QUANTITATIVE: Hep B S AB Quant (Post): >1000 m[IU]/mL (ref >9.9)

## 2021-05-16 LAB — SAMPLE TO BLOOD BANK

## 2021-05-16 MED ORDER — ACETAMINOPHEN 325 MG PO TABS
650.0000 mg | ORAL_TABLET | Freq: Once | ORAL | Status: AC
  Administered 2021-05-16: 650 mg via ORAL
  Filled 2021-05-16: qty 2

## 2021-05-16 MED ORDER — LUSPATERCEPT-AAMT 75 MG ~~LOC~~ SOLR
1.7500 mg/kg | Freq: Once | SUBCUTANEOUS | Status: AC
  Administered 2021-05-16: 150 mg via SUBCUTANEOUS
  Filled 2021-05-16: qty 3

## 2021-05-16 MED ORDER — DIPHENHYDRAMINE HCL 25 MG PO CAPS
25.0000 mg | ORAL_CAPSULE | Freq: Once | ORAL | Status: AC
  Administered 2021-05-16: 25 mg via ORAL
  Filled 2021-05-16: qty 1

## 2021-05-16 MED ORDER — SODIUM CHLORIDE 0.9% IV SOLUTION
250.0000 mL | Freq: Once | INTRAVENOUS | Status: AC
  Administered 2021-05-16: 250 mL via INTRAVENOUS

## 2021-05-16 MED ORDER — DIPHENHYDRAMINE HCL 25 MG PO CAPS: 25.0000 mg | ORAL_CAPSULE | Freq: Once | ORAL | Status: DC

## 2021-05-16 NOTE — Telephone Encounter (Signed)
Pt would like an call tomorrow after 12 to complete TCM call.

## 2021-05-16 NOTE — Progress Notes (Signed)
Okay to run blood at 33ml/hr per Dr Alen Blew.

## 2021-05-17 ENCOUNTER — Telehealth: Payer: Self-pay

## 2021-05-17 LAB — TYPE AND SCREEN
ABO/RH(D): A NEG
Antibody Screen: NEGATIVE
Unit division: 0

## 2021-05-17 LAB — BPAM RBC
Blood Product Expiration Date: 202211302359
ISSUE DATE / TIME: 202211021445
Unit Type and Rh: 600

## 2021-05-17 NOTE — Telephone Encounter (Signed)
Transition Care Management Follow-up Telephone Call Date of discharge and from where: 05/15/2021 [facility]  How have you been since you were released from the hospital? Pt states she is doing better.  Any questions or concerns? No  Items Reviewed: Did the pt receive and understand the discharge instructions provided? Yes  Medications obtained and verified? Yes  Other? Yes  Any new allergies since your discharge? No  Dietary orders reviewed? Yes Do you have support at home? Yes   Home Care and Equipment/Supplies: Were home health services ordered? not applicable If so, what is the name of the agency? N/a  Has the agency set up a time to come to the patient's home? not applicable Were any new equipment or medical supplies ordered?  No What is the name of the medical supply agency? N/a Were you able to get the supplies/equipment? not applicable Do you have any questions related to the use of the equipment or supplies? No  Functional Questionnaire: (I = Independent and D = Dependent) ADLs: I/d  Bathing/Dressing- I/d  Meal Prep- I/d  Eating- I/d  Maintaining continence- I/d  Transferring/Ambulation- I/d  Managing Meds- I/d  Follow up appointments reviewed:  PCP Hospital f/u appt confirmed? Yes  Scheduled to see raman ghumman on 05/24/2021 @ [facility]. [facility] f/u appt confirmed? No  Scheduled to see n/a on n/a @ n/a. Are transportation arrangements needed? No  If their condition worsens, is the pt aware to call PCP or go to the Emergency Dept.? Yes Was the patient provided with contact information for the PCP's office or ED? Yes Was to pt encouraged to call back with questions or concerns? Yes

## 2021-05-22 ENCOUNTER — Other Ambulatory Visit: Payer: Medicare Other

## 2021-05-22 ENCOUNTER — Telehealth: Payer: Self-pay

## 2021-05-22 NOTE — Chronic Care Management (AMB) (Signed)
   Called Patricia Mata, No answer, left message of appointment on 05-23-2021 at 2:00 via telephone visit with Orlando Penner, Pharm D. Notified to have all medications, supplements, blood pressure and/or blood sugar logs available during appointment and to return call if need to reschedule.   Care Gaps: AWV 12-13-2021 Shingrix overdue  Covid booster overdue last completed 04-07-2020 Yearly foot exam overdue last completed 01-08-2019 Flu vaccine overdue last completed 04-28-2020  Star Rating Drug: Tradjenta 5 mg- Last filled 05-02-2021 30 DS [facility] Atorvastatin 40 mg- Last filled 02-08-2021 90 DS [facility]  Any gaps in medications fill history? Yes   [place] Pharmacist Assistant [phone]

## 2021-05-23 ENCOUNTER — Ambulatory Visit (INDEPENDENT_AMBULATORY_CARE_PROVIDER_SITE_OTHER): Payer: BC Managed Care – PPO

## 2021-05-23 VITALS — BP 119/64

## 2021-05-23 DIAGNOSIS — I10 Essential (primary) hypertension: Secondary | ICD-10-CM

## 2021-05-23 DIAGNOSIS — E1122 Type 2 diabetes mellitus with diabetic chronic kidney disease: Secondary | ICD-10-CM

## 2021-05-23 NOTE — Progress Notes (Signed)
Chronic Care Management Pharmacy Note  05/30/2021 Name:  Margarett Viti MRN:  [id number] DOB:  [birth year]  Summary: Patient reports that she is doing okay since being discharged from the hospital.   Recommendations/Changes made from today's visit: Recommend patient receive COVID-19 vaccine.   Plan: Patient to consider getting COVID- 19 vaccine. Patient request more test strips for her BS.   Subjective: Jaylie Neaves is an 65 y.o. year old female who is a primary patient of [name], [place].  The CCM team was consulted for assistance with disease management and care coordination needs.    Engaged with patient by telephone for follow up visit in response to provider referral for pharmacy case management and/or care coordination services.   Consent to Services:  The patient was given information about Chronic Care Management services, agreed to services, and gave verbal consent prior to initiation of services.  Please see initial visit note for detailed documentation.   Patient Care Team: [name], [place] as PCP - General ([place]) Lorretta Harp, MD as PCP - Cardiology (Cardiology) Bensimhon, Shaune Pascal, MD as PCP - Advanced Heart Failure (Cardiology) Mayford Knife, [facility] (Pharmacist) Center, [facility]  Recent office visits: 01/31/2021 PCP OV  Recent consult visits: 05/07/2021 Cardiology [facility] visits: 04/16/2021    Objective:  Lab Results  Component Value Date   CREATININE 6.30 (H) 05/24/2021   BUN 63 (H) 05/24/2021   GFRNONAA 8 (L) 05/24/2021   GFRAA 16 (L) 09/05/2020   NA 139 05/24/2021   K 3.9 05/24/2021   CALCIUM 9.2 05/24/2021   CO2 24 05/24/2021   GLUCOSE 86 05/24/2021    Lab Results  Component Value Date/Time   HGBA1C 6.5 (H) 05/13/2021 07:12 PM   HGBA1C 6.1 (H) 01/13/2021 11:27 PM   MICROALBUR 80 06/22/2019 12:02 PM    Last diabetic Eye exam:  Lab Results  Component Value Date/Time   HMDIABEYEEXA No  Retinopathy 08/08/2020 12:00 AM    Last diabetic Foot exam: No results found for: HMDIABFOOTEX   Lab Results  Component Value Date   CHOL 176 11/21/2020   HDL 88 11/21/2020   LDLCALC 71 11/21/2020   TRIG 85 11/21/2020   CHOLHDL 2.0 11/21/2020    Hepatic Function Latest Ref Rng & Units 05/24/2021 05/15/2021 05/14/2021  Total Protein 6.5 - 8.1 g/dL 5.9(L) - 6.1(L)  Albumin 3.5 - 5.0 g/dL 3.0(L) 3.2(L) 3.1(L)  AST 15 - 41 U/L 45(H) - 45(H)  ALT 0 - 44 U/L 28 - 30  Alk Phosphatase 38 - 126 U/L 91 - 102  Total Bilirubin 0.3 - 1.2 mg/dL 0.9 - 1.4(H)  Bilirubin, Direct 0.0 - 0.2 mg/dL - - -    Lab Results  Component Value Date/Time   TSH 2.058 05/14/2021 02:23 AM   TSH 0.616 04/23/2021 01:22 AM   TSH 1.980 01/31/2021 02:17 PM   TSH 1.880 12/04/2020 05:08 PM   FREET4 1.11 06/08/2020 02:33 AM   FREET4 1.49 04/19/2019 04:39 PM    CBC Latest Ref Rng & Units 05/25/2021 05/25/2021 05/24/2021  WBC 4.0 - 10.5 K/uL - - -  Hemoglobin 12.0 - 15.0 g/dL 9.2(L) 8.4(L) 8.7(L)  Hematocrit 36.0 - 46.0 % 27.9(L) 24.4(L) 26.1(L)  Platelets 150 - 400 K/uL - - -    No results found for: VD25OH  Clinical ASCVD: Yes  The ASCVD Risk score (Arnett DK, et al., 2019) failed to calculate for the following reasons:   The patient has a prior MI or  stroke diagnosis    Depression screen [facility] 2/9 11/30/2020  Decreased Interest 0  Down, Depressed, Hopeless 0  PHQ - 2 Score 0  Some recent data might be hidden     Social History   Tobacco Use  Smoking Status Former   Packs/day: 0.25   Years: 20.00   Pack years: 5.00   Types: Cigarettes   Quit date: 1997   Years since quitting: 25.8  Smokeless Tobacco Former   BP Readings from Last 3 Encounters:  05/25/21 (!) 128/33  05/23/21 119/64  05/16/21 (!) 173/45   Pulse Readings from Last 3 Encounters:  05/25/21 63  05/16/21 (!) 59  05/15/21 74   Wt Readings from Last 3 Encounters:  05/24/21 191 lb 12.8 oz (87 kg)  05/16/21 191 lb (86.6 kg)   05/15/21 190 lb 7.6 oz (86.4 kg)   BMI Readings from Last 3 Encounters:  05/24/21 32.92 kg/m  05/16/21 30.83 kg/m  05/15/21 30.74 kg/m    Assessment/Interventions: Review of patient past medical history, allergies, medications, health status, including review of consultants reports, laboratory and other test data, was performed as part of comprehensive evaluation and provision of chronic care management services.   SDOH:  (Social Determinants of Health) assessments and interventions performed: No  SDOH Screenings   Alcohol Screen: Not on file  Depression (PHQ2-9): Low Risk    PHQ-2 Score: 0  Financial Resource Strain: Low Risk    Difficulty of Paying Living Expenses: Not hard at all  Food Insecurity: No Food Insecurity   Worried About Charity fundraiser in the Last Year: Never true   Ran Out of Food in the Last Year: Never true  Housing: Not on file  Physical Activity: Inactive   Days of Exercise per Week: 0 days   Minutes of Exercise per Session: 0 min  Social Connections: Not on file  Stress: No Stress Concern Present   Feeling of Stress : Not at all  Tobacco Use: Medium Risk   Smoking Tobacco Use: Former   Smokeless Tobacco Use: Former   Passive Exposure: Not on Pensions consultant Needs: No Transportation Needs   Lack of Transportation (Medical): No   Lack of Transportation (Non-Medical): No    CCM Care Plan  Allergies  Allergen Reactions   Lactose Intolerance (Gi) Diarrhea   Morphine And Related Itching   Sulfa Antibiotics Rash and Other (See Comments)    Blisters, also    Medications Reviewed Today     Reviewed by Ronnette Juniper, MD (Physician) on 05/24/21 at 1414  Med List Status: Complete   Medication Order Taking? Sig Documenting Provider Last Dose Status Informant  acetaminophen (TYLENOL) 500 MG tablet [id number] Yes Take 1,000 mg by mouth every 6 (six) hours as needed (pain). [provider] 05/23/2021 Active Self  allopurinol (ZYLOPRIM) 300  MG tablet [id number] Yes Take 300 mg by mouth every morning. [provider] 05/23/2021 Active Self  amiodarone (PACERONE) 200 MG tablet [id number] Yes Take 1 tablet by mouth once daily  Patient taking differently: Take 200 mg by mouth every morning.   Fenton, Clint R, [place] 05/23/2021 Active Self  atorvastatin (LIPITOR) 40 MG tablet [id number] Yes Take 1 tablet by mouth once daily  Patient taking differently: Take 40 mg by mouth every morning.   [name], FNP 05/23/2021 Active Self  blood glucose meter kit and supplies [id number] Yes Dispense based on patient and insurance preference. Use up to four times daily as directed. (FOR ICD-10 E10.9, E11.9). Laurance Flatten,  [name], FNP  Active Self  Blood Glucose Monitoring Suppl [facility]) w/Device KIT [id number] Yes Check blood sugars twice daily E11.9 [name], FNP  Active Self  carvedilol (COREG) 6.25 MG tablet [id number] Yes Take 1 tablet (6.25 mg total) by mouth 2 (two) times daily with a meal. Thurnell Lose, MD 05/23/2021 2200 Active Self  diphenhydrAMINE (BENADRYL) 25 MG tablet [id number] Yes Take 25 mg by mouth every 8 (eight) hours as needed for itching. [provider] 05/23/2021 Active Self  ethyl chloride spray [id number] Yes Apply 1 application topically See admin instructions. Apply topically to dialysis site if there is no time to use emla cream [provider]  Active Self  glucose blood test strip [id number] Yes check blood sugars twice daily Dx code E11.22 [name], FNP  Active Self  hydrALAZINE (APRESOLINE) 100 MG tablet [id number] Yes Take 1 tablet (100 mg total) by mouth 3 (three) times daily.  Patient taking differently: Take 100 mg by mouth See admin instructions. Take one tablet (100 mg) by mouth three times daily - morning, dinner and bedtime   Lorretta Harp, MD 05/23/2021 Active Self  Insulin Glargine (BASAGLAR KWIKPEN) 100 UNIT/ML [id number] Yes Inject 20 Units into the skin daily as needed (only if  blood sugar over 150). [provider] 05/23/2021 Active Self  Insulin Pen Needle 29G X 5MM MISC [id number] Yes Use as directed Bonnielee Haff, MD  Active Self  Lancets (ONETOUCH DELICA PLUS MEBRAX09M) [place] [id number] Yes check blood sugars twice daily Dx code: E11.22 [name], FNP  Active Self  latanoprost (XALATAN) 0.005 % ophthalmic solution [id number] Yes Place 1 drop into both eyes at bedtime.  [provider] 05/23/2021 Active Self  levothyroxine (SYNTHROID) 150 MCG tablet [id number] Yes TAKE 1 TABLET BY MOUTH ONCE DAILY BEFORE BREAKFAST  Patient taking differently: Take 150 mcg by mouth daily before breakfast.   [name], FNP 05/23/2021 Active Self  lidocaine-prilocaine (EMLA) cream [id number] Yes Apply 1 application topically See admin instructions. Apply to dialysis site 30 minutes before arriving at dialysis on Tuesday, Thursday, Saturday [provider] 05/22/2021 Active Self  linagliptin (TRADJENTA) 5 MG TABS tablet [id number] Yes Take 1 tablet (5 mg total) by mouth daily. [name], FNP 05/23/2021 Active Self  Methoxy PEG-Epoetin Angus Seller [id number] Yes Mircera [provider]  Active Self           Med Note Quinn Axe May 13, 2021  7:41 PM) At dialysis?  Multiple Vitamin (MULTIVITAMIN WITH MINERALS) TABS tablet [id number] Yes Take 1 tablet by mouth every morning. Centrum Silver 50+ [provider] 05/23/2021 Active Self  omeprazole (PRILOSEC) 40 MG capsule [id number] Yes Take 1 capsule (40 mg total) by mouth 2 (two) times daily with a meal. Pokhrel, Laxman, MD 05/23/2021 Active Self  polyethylene glycol (MIRALAX / GLYCOLAX) 17 g packet [id number] Yes Take 17 g by mouth daily as needed for moderate constipation. Geradine Girt, DO Past Month Active Self  sildenafil (REVATIO) 20 MG tablet [id number] Yes TAKE 1 TABLET BY MOUTH THREE TIMES DAILY  Patient taking differently: Take 20 mg by mouth See admin instructions. Take one  tablet (20 mg) by mouth three times daily - morning, supper and bedtime   Sherren Mocha, MD 05/23/2021 Active Self  Med List Note Payton Doughty, CPhT 05/13/21 1929): Dialysis Tuesday, Thursday, Saturday Richarda Blade.            Patient Active Problem List   Diagnosis  Date Noted   Acute blood loss anemia 05/24/2021   UGIB (upper gastrointestinal bleed) 05/24/2021   Thrombocytopenia ([place]) 05/13/2021   Secondary hypercoagulable state ([place]) 05/07/2021   Atrial fibrillation with RVR ([place]) 04/21/2021   Anemia 01/13/2021   Obesity 11/23/2020   Joint pain 11/23/2020   Other long term (current) drug therapy 11/23/2020   Primary osteoarthritis 11/23/2020   ESRD (end stage renal disease) ([place]) 11/22/2020   Nonrheumatic mitral (valve) stenosis 10/17/2020   Coagulation defect, unspecified ([place]) 42/70/6237   Complication of vascular dialysis catheter 10/14/2020   Peripheral vascular disease ([place]) 10/14/2020   NSTEMI (non-ST elevated myocardial infarction) ([place]) 10/10/2020   Angina pectoris ([place]) 10/09/2020   Angina at rest ([place]) 10/09/2020   Gastroesophageal reflux disease 09/12/2020   History of malignant neoplasm of colon 09/12/2020   Chronic gastritis 09/02/2020   Symptomatic anemia 08/31/2020   Abnormal uterine bleeding (AUB) 07/19/2020   Vaginal ulceration 07/19/2020   Acute renal failure superimposed on stage 4 chronic kidney disease ([place])    Palliative care by specialist    Acute on chronic diastolic CHF (congestive heart failure) ([place]) 06/07/2020   Chronic cough 05/04/2020   Allergic rhinitis 03/23/2020   Chronic kidney disease, stage 5 ([place]) 62/83/1517   Acute diastolic CHF (congestive heart failure) ([place]) 09/21/2019   HCAP (healthcare-associated pneumonia) 09/20/2019   Pulmonary hypertension ([place]) 09/20/2019   DM2 (diabetes mellitus, type 2) ([place]) 09/20/2019   Elevated troponin 09/20/2019   Severe sepsis ([place]) 09/20/2019   CAP (community acquired pneumonia) 09/14/2019    Epistaxis, recurrent 08/10/2019   Neutropenia ([place]) 05/25/2019   Malignant neoplasm of colon ([place]) 05/25/2019   Bradycardia 03/21/2019   Pneumonia due to COVID-19 virus 03/18/2019   Type 2 diabetes mellitus with hemoglobin A1c goal of less than 7.0% ([place]) 12/10/2018   Hypothyroidism 12/10/2018   Seasonal allergies 12/10/2018   Gout 09/08/2018   Bacterial infection due to Morganella morganii    Sacral wound 06/08/2018   Iron deficiency anemia due to chronic blood loss 06/08/2018   Hyponatremia 06/08/2018   Constipation 06/08/2018   Encounter for nasogastric (NG) tube placement    Pressure injury of skin 05/22/2018   Paroxysmal atrial fibrillation ([place]) 05/11/2018   Normocytic anemia 06/14/2017   Aortic stenosis, severe 06/10/2017   Carotid artery disease ([place]) 06/10/2017   Essential hypertension 05/13/2017   Hyperlipidemia 05/13/2017   Bilateral lower extremity edema 05/13/2017   Port-A-Cath in place 04/28/2017   MDS (myelodysplastic syndrome) ([place]) 03/20/2017   Goals of care, counseling/discussion 03/20/2017   Anemia in chronic kidney disease 05/10/2016   Anemia in stage 1 chronic kidney disease 05/10/2016   Anemia due to chronic kidney disease 02/09/2016    Immunization History  Administered Date(s) Administered   Fluad Quad(high Dose 65+) 04/28/2020   Hepb-cpg 12/21/2020, 01/25/2021, 03/29/2021, 04/26/2021   Influenza,inj,Quad PF,6+ Mos 05/09/2017, 09/15/2018, 04/13/2019   Influenza-Unspecified 04/05/2016   PFIZER(Purple Top)SARS-COV-2 Vaccination 09/11/2019, 01/10/2020, 04/07/2020   Pneumococcal Conjugate-13 05/04/2020    Conditions to be addressed/monitored:  Hypertension and Diabetes  Care Plan : [place]  Updates made by Mayford Knife, [place] since 05/30/2021 12:00 AM     Problem: HTN, DM   Priority: High     Long-Range Goal: Disease Management   Recent Progress: On track  Priority: High  Note:   Current Barriers:  Unable to  independently monitor therapeutic efficacy  Pharmacist Clinical Goal(s):  Patient will achieve adherence to monitoring guidelines and medication adherence to achieve therapeutic efficacy through collaboration  with PharmD and provider.   Interventions: 1:1 collaboration with [name], FNP regarding development and update of comprehensive plan of care as evidenced by provider attestation and co-signature Inter-disciplinary care team collaboration (see longitudinal plan of care) Comprehensive medication review performed; medication list updated in electronic medical record  Hypertension (BP goal <130/80) -Controlled -Current treatment: Hydralazine 100 MG - taking 1 tablet by mouth three times per day Revatio 20 mg- taking 1 tablet by mouth three times daily  Carvedilol 6.25 mg tablet two time per day Amiodarone 200 mg tablet once daily  -Current home readings: she is checking her BP everyday, current readings: 119/49, 127/47,139/63, 137/64, 124/59  -Current dietary habits: she is not using a lot of salt -Current exercise habits: she is not currently exercising.  -Denies hypotensive/hypertensive symptoms -Educated on Symptoms of hypotension and importance of maintaining adequate hydration; -Counseled to monitor BP at home at least 5 days per week, document, and provide log at future appointments -Recommended to continue current medication  Diabetes (A1c goal <8%) -Controlled -Current medications: Tradjenta 5 mg tablet once per day  Basaglar  She has had to use the insulin 6 times in October and 3 times this month.  Patient needs more  test strips  -Current home glucose readings fasting glucose: 203, 210, 108, 90, 107 -Denies hypoglycemic/hyperglycemic symptoms -Current meal patterns: her husband does most of the cooking  drinks: will discuss further during next visit  -Current exercise: will discuss further during next office visit  -Educated on Counseled to check feet daily  and get yearly eye exams -Counseled to check feet daily and get yearly eye exams  -Patient is getting feet checked at Dialysis and the podiatrist.              -Last appointment with podiatrist was 04/06/2021 , Dr. Milly Jakob  -Recommended to continue current medication    Patient Goals/Self-Care Activities Patient will:  - take medications as prescribed as evidenced by patient report and record review  Follow Up Plan: The patient has been provided with contact information for the care management team and has been advised to call with any health related questions or concerns.       Medication Assistance: None required.  Patient affirms current coverage meets needs.  Compliance/Adherence/Medication fill history: Care Gaps: Pneumonia Vaccine   Influenza Vaccine DEXA Scan - she is going to get her appointment for a mammogram and DEXA  COVID-19 Booster - she is going to schedule her booster for [employer] Pneumonia Vaccine - if possible during next visit  Star-Rating Drugs: Atorvastatin 40 mg tablet  Tradjenta 5 mg  Patient's preferred pharmacy is:  [facility]  [place], [place] - [address] [place] [address] Phone: [phone] Fax: [phone]  Uses pill box? Yes Pt endorses 95% compliance  We discussed: Benefits of medication synchronization, packaging and delivery as well as enhanced pharmacist oversight with [employer]. Patient decided to: Continue current medication management strategy  Care Plan and Follow Up Patient Decision:  Patient agrees to Care Plan and Follow-up.  Plan: The patient has been provided with contact information for the care management team and has been advised to call with any health related questions or concerns.   Orlando Penner, CPP, PharmD Clinical Pharmacist Practitioner [facility] [phone]

## 2021-05-23 NOTE — Telephone Encounter (Signed)
Error

## 2021-05-24 ENCOUNTER — Encounter (HOSPITAL_COMMUNITY)
Admission: EM | Disposition: A | Discharge status: Home / Self Care | Payer: Self-pay | Source: Home / Self Care | Attending: Emergency Medicine

## 2021-05-24 ENCOUNTER — Other Ambulatory Visit: Payer: Self-pay

## 2021-05-24 ENCOUNTER — Emergency Department (HOSPITAL_COMMUNITY): Payer: Medicare Other

## 2021-05-24 ENCOUNTER — Observation Stay (HOSPITAL_COMMUNITY)
Admission: EM | Admit: 2021-05-24 | Discharge: 2021-05-25 | Disposition: A | Discharge status: Home / Self Care | Payer: Medicare Other | Attending: Family Medicine | Admitting: Family Medicine

## 2021-05-24 ENCOUNTER — Encounter (HOSPITAL_COMMUNITY): Payer: Self-pay

## 2021-05-24 ENCOUNTER — Telehealth: Payer: BC Managed Care – PPO | Admitting: Nurse Practitioner

## 2021-05-24 ENCOUNTER — Inpatient Hospital Stay (HOSPITAL_COMMUNITY): Payer: Medicare Other | Admitting: Anesthesiology

## 2021-05-24 DIAGNOSIS — D62 Acute posthemorrhagic anemia: Secondary | ICD-10-CM | POA: Insufficient documentation

## 2021-05-24 DIAGNOSIS — Z7984 Long term (current) use of oral hypoglycemic drugs: Secondary | ICD-10-CM | POA: Diagnosis not present

## 2021-05-24 DIAGNOSIS — I350 Nonrheumatic aortic (valve) stenosis: Secondary | ICD-10-CM

## 2021-05-24 DIAGNOSIS — E039 Hypothyroidism, unspecified: Secondary | ICD-10-CM | POA: Diagnosis not present

## 2021-05-24 DIAGNOSIS — D631 Anemia in chronic kidney disease: Secondary | ICD-10-CM | POA: Insufficient documentation

## 2021-05-24 DIAGNOSIS — E1122 Type 2 diabetes mellitus with diabetic chronic kidney disease: Secondary | ICD-10-CM | POA: Insufficient documentation

## 2021-05-24 DIAGNOSIS — R197 Diarrhea, unspecified: Secondary | ICD-10-CM | POA: Diagnosis present

## 2021-05-24 DIAGNOSIS — K31819 Angiodysplasia of stomach and duodenum without bleeding: Secondary | ICD-10-CM | POA: Insufficient documentation

## 2021-05-24 DIAGNOSIS — K922 Gastrointestinal hemorrhage, unspecified: Secondary | ICD-10-CM | POA: Diagnosis not present

## 2021-05-24 DIAGNOSIS — N186 End stage renal disease: Secondary | ICD-10-CM | POA: Insufficient documentation

## 2021-05-24 DIAGNOSIS — I5032 Chronic diastolic (congestive) heart failure: Secondary | ICD-10-CM | POA: Diagnosis not present

## 2021-05-24 DIAGNOSIS — Z20822 Contact with and (suspected) exposure to covid-19: Secondary | ICD-10-CM | POA: Insufficient documentation

## 2021-05-24 DIAGNOSIS — Z992 Dependence on renal dialysis: Secondary | ICD-10-CM | POA: Insufficient documentation

## 2021-05-24 DIAGNOSIS — K6389 Other specified diseases of intestine: Secondary | ICD-10-CM | POA: Insufficient documentation

## 2021-05-24 DIAGNOSIS — I480 Paroxysmal atrial fibrillation: Secondary | ICD-10-CM | POA: Insufficient documentation

## 2021-05-24 DIAGNOSIS — I132 Hypertensive heart and chronic kidney disease with heart failure and with stage 5 chronic kidney disease, or end stage renal disease: Secondary | ICD-10-CM | POA: Insufficient documentation

## 2021-05-24 DIAGNOSIS — X58XXXA Exposure to other specified factors, initial encounter: Secondary | ICD-10-CM | POA: Diagnosis not present

## 2021-05-24 DIAGNOSIS — T182XXA Foreign body in stomach, initial encounter: Secondary | ICD-10-CM | POA: Diagnosis not present

## 2021-05-24 DIAGNOSIS — Z79899 Other long term (current) drug therapy: Secondary | ICD-10-CM | POA: Insufficient documentation

## 2021-05-24 DIAGNOSIS — Z87891 Personal history of nicotine dependence: Secondary | ICD-10-CM | POA: Insufficient documentation

## 2021-05-24 DIAGNOSIS — I10 Essential (primary) hypertension: Secondary | ICD-10-CM | POA: Diagnosis present

## 2021-05-24 DIAGNOSIS — I272 Pulmonary hypertension, unspecified: Secondary | ICD-10-CM | POA: Diagnosis present

## 2021-05-24 DIAGNOSIS — Z794 Long term (current) use of insulin: Secondary | ICD-10-CM | POA: Diagnosis not present

## 2021-05-24 DIAGNOSIS — K3189 Other diseases of stomach and duodenum: Secondary | ICD-10-CM | POA: Diagnosis not present

## 2021-05-24 DIAGNOSIS — D649 Anemia, unspecified: Secondary | ICD-10-CM | POA: Diagnosis not present

## 2021-05-24 DIAGNOSIS — E119 Type 2 diabetes mellitus without complications: Secondary | ICD-10-CM

## 2021-05-24 HISTORY — PX: HOT HEMOSTASIS: SHX5433

## 2021-05-24 HISTORY — PX: ENTEROSCOPY: SHX5533

## 2021-05-24 LAB — POCT I-STAT, CHEM 8
BUN: 63 mg/dL — ABNORMAL HIGH (ref 8–23)
Calcium, Ion: 1.15 mmol/L (ref 1.15–1.40)
Chloride: 99 mmol/L (ref 98–111)
Creatinine, Ser: 6.3 mg/dL — ABNORMAL HIGH (ref 0.44–1.00)
Glucose, Bld: 86 mg/dL (ref 70–99)
HCT: 19 % — ABNORMAL LOW (ref 36.0–46.0)
Hemoglobin: 6.5 g/dL — CL (ref 12.0–15.0)
Potassium: 3.9 mmol/L (ref 3.5–5.1)
Sodium: 139 mmol/L (ref 135–145)
TCO2: 28 mmol/L (ref 22–32)

## 2021-05-24 LAB — CBC WITH DIFFERENTIAL/PLATELET
Abs Immature Granulocytes: 0.03 10*3/uL (ref 0.00–0.07)
Basophils Absolute: 0 10*3/uL (ref 0.0–0.1)
Basophils Relative: 0 %
Eosinophils Absolute: 0 10*3/uL (ref 0.0–0.5)
Eosinophils Relative: 0 %
HCT: 17.5 % — ABNORMAL LOW (ref 36.0–46.0)
Hemoglobin: 5.5 g/dL — CL (ref 12.0–15.0)
Immature Granulocytes: 1 %
Lymphocytes Relative: 9 %
Lymphs Abs: 0.6 10*3/uL — ABNORMAL LOW (ref 0.7–4.0)
MCH: 30.2 pg (ref 26.0–34.0)
MCHC: 31.4 g/dL (ref 30.0–36.0)
MCV: 96.2 fL (ref 80.0–100.0)
Monocytes Absolute: 1 10*3/uL (ref 0.1–1.0)
Monocytes Relative: 16 %
Neutro Abs: 4.7 10*3/uL (ref 1.7–7.7)
Neutrophils Relative %: 74 %
Platelets: 170 10*3/uL (ref 150–400)
RBC: 1.82 MIL/uL — ABNORMAL LOW (ref 3.87–5.11)
RDW: 20.8 % — ABNORMAL HIGH (ref 11.5–15.5)
WBC: 6.4 10*3/uL (ref 4.0–10.5)
nRBC: 1.9 % — ABNORMAL HIGH (ref 0.0–0.2)

## 2021-05-24 LAB — COMPREHENSIVE METABOLIC PANEL
ALT: 28 U/L (ref 0–44)
AST: 45 U/L — ABNORMAL HIGH (ref 15–41)
Albumin: 3 g/dL — ABNORMAL LOW (ref 3.5–5.0)
Alkaline Phosphatase: 91 U/L (ref 38–126)
Anion gap: 15 (ref 5–15)
BUN: 59 mg/dL — ABNORMAL HIGH (ref 8–23)
CO2: 24 mmol/L (ref 22–32)
Calcium: 9.2 mg/dL (ref 8.9–10.3)
Chloride: 97 mmol/L — ABNORMAL LOW (ref 98–111)
Creatinine, Ser: 5.65 mg/dL — ABNORMAL HIGH (ref 0.44–1.00)
GFR, Estimated: 8 mL/min — ABNORMAL LOW (ref >60)
Glucose, Bld: 129 mg/dL — ABNORMAL HIGH (ref 70–99)
Potassium: 3.7 mmol/L (ref 3.5–5.1)
Sodium: 136 mmol/L (ref 135–145)
Total Bilirubin: 0.9 mg/dL (ref 0.3–1.2)
Total Protein: 5.9 g/dL — ABNORMAL LOW (ref 6.5–8.1)

## 2021-05-24 LAB — TROPONIN I (HIGH SENSITIVITY)
Troponin I (High Sensitivity): 61 ng/L — ABNORMAL HIGH (ref <18)
Troponin I (High Sensitivity): 64 ng/L — ABNORMAL HIGH (ref <18)

## 2021-05-24 LAB — PROTIME-INR
INR: 1.2 (ref 0.8–1.2)
Prothrombin Time: 14.7 seconds (ref 11.4–15.2)

## 2021-05-24 LAB — HEMOGLOBIN AND HEMATOCRIT, BLOOD
HCT: 26.1 % — ABNORMAL LOW (ref 36.0–46.0)
Hemoglobin: 8.7 g/dL — ABNORMAL LOW (ref 12.0–15.0)

## 2021-05-24 LAB — RESP PANEL BY RT-PCR (FLU A&B, COVID) ARPGX2
Influenza A by PCR: NEGATIVE
Influenza B by PCR: NEGATIVE
SARS Coronavirus 2 by RT PCR: NEGATIVE

## 2021-05-24 LAB — GLUCOSE, CAPILLARY
Glucose-Capillary: 132 mg/dL — ABNORMAL HIGH (ref 70–99)
Glucose-Capillary: 103 mg/dL — ABNORMAL HIGH (ref 70–99)

## 2021-05-24 LAB — PREPARE RBC (CROSSMATCH)

## 2021-05-24 LAB — CBG MONITORING, ED: Glucose-Capillary: 95 mg/dL (ref 70–99)

## 2021-05-24 SURGERY — ENTEROSCOPY
Anesthesia: Monitor Anesthesia Care

## 2021-05-24 MED ORDER — ACETAMINOPHEN 325 MG PO TABS
650.0000 mg | ORAL_TABLET | Freq: Once | ORAL | Status: AC
  Administered 2021-05-24: 650 mg via ORAL
  Filled 2021-05-24: qty 2

## 2021-05-24 MED ORDER — PANTOPRAZOLE 80MG IVPB - SIMPLE MED
80.0000 mg | Freq: Once | INTRAVENOUS | Status: AC
  Administered 2021-05-24: 80 mg via INTRAVENOUS
  Filled 2021-05-24: qty 80

## 2021-05-24 MED ORDER — FENTANYL CITRATE PF 50 MCG/ML IJ SOSY
25.0000 ug | PREFILLED_SYRINGE | Freq: Once | INTRAMUSCULAR | Status: AC
  Administered 2021-05-24: 25 ug via INTRAVENOUS
  Filled 2021-05-24: qty 1

## 2021-05-24 MED ORDER — PANTOPRAZOLE SODIUM 40 MG IV SOLR
40.0000 mg | Freq: Every day | INTRAVENOUS | Status: DC
  Administered 2021-05-24 – 2021-05-25 (×2): 40 mg via INTRAVENOUS
  Filled 2021-05-24 (×2): qty 40

## 2021-05-24 MED ORDER — AMIODARONE HCL 200 MG PO TABS
200.0000 mg | ORAL_TABLET | Freq: Every morning | ORAL | Status: DC
  Administered 2021-05-25: 200 mg via ORAL
  Filled 2021-05-24: qty 1

## 2021-05-24 MED ORDER — LINAGLIPTIN 5 MG PO TABS
5.0000 mg | ORAL_TABLET | Freq: Every day | ORAL | Status: DC
  Administered 2021-05-24 – 2021-05-25 (×2): 5 mg via ORAL
  Filled 2021-05-24 (×3): qty 1

## 2021-05-24 MED ORDER — HYDRALAZINE HCL 50 MG PO TABS
100.0000 mg | ORAL_TABLET | Freq: Three times a day (TID) | ORAL | Status: DC
  Administered 2021-05-24 – 2021-05-25 (×3): 100 mg via ORAL
  Filled 2021-05-24 (×6): qty 2

## 2021-05-24 MED ORDER — LIDOCAINE-PRILOCAINE 2.5-2.5 % EX CREA: 1.0000 "application " | TOPICAL_CREAM | CUTANEOUS | Status: DC

## 2021-05-24 MED ORDER — ONDANSETRON HCL 4 MG PO TABS
4.0000 mg | ORAL_TABLET | Freq: Four times a day (QID) | ORAL | Status: DC | PRN
  Filled 2021-05-24: qty 1

## 2021-05-24 MED ORDER — SODIUM CHLORIDE 0.9 % IV SOLN: 10.0000 mL/h | Freq: Once | INTRAVENOUS | Status: DC

## 2021-05-24 MED ORDER — METOCLOPRAMIDE HCL 5 MG/ML IJ SOLN
10.0000 mg | Freq: Once | INTRAMUSCULAR | Status: AC
  Administered 2021-05-24: 10 mg via INTRAVENOUS
  Filled 2021-05-24: qty 2

## 2021-05-24 MED ORDER — PANTOPRAZOLE SODIUM 40 MG IV SOLR: 40.0000 mg | Freq: Two times a day (BID) | INTRAVENOUS | Status: DC

## 2021-05-24 MED ORDER — DIPHENHYDRAMINE HCL 25 MG PO CAPS
25.0000 mg | ORAL_CAPSULE | Freq: Three times a day (TID) | ORAL | Status: DC | PRN
  Administered 2021-05-24: 25 mg via ORAL
  Filled 2021-05-24: qty 1

## 2021-05-24 MED ORDER — SODIUM CHLORIDE 0.9 % IV SOLN: 100.0000 mL | INTRAVENOUS | Status: DC | PRN

## 2021-05-24 MED ORDER — ACETAMINOPHEN 325 MG PO TABS: 650.0000 mg | ORAL_TABLET | Freq: Four times a day (QID) | ORAL | Status: DC | PRN

## 2021-05-24 MED ORDER — ACETAMINOPHEN 650 MG RE SUPP: 650.0000 mg | Freq: Four times a day (QID) | RECTAL | Status: DC | PRN

## 2021-05-24 MED ORDER — ATORVASTATIN CALCIUM 40 MG PO TABS
40.0000 mg | ORAL_TABLET | Freq: Every morning | ORAL | Status: DC
  Administered 2021-05-24 – 2021-05-25 (×2): 40 mg via ORAL
  Filled 2021-05-24 (×2): qty 1

## 2021-05-24 MED ORDER — PANTOPRAZOLE INFUSION (NEW) - SIMPLE MED
8.0000 mg/h | INTRAVENOUS | Status: DC
  Filled 2021-05-24: qty 100

## 2021-05-24 MED ORDER — CARVEDILOL 6.25 MG PO TABS
6.2500 mg | ORAL_TABLET | Freq: Two times a day (BID) | ORAL | Status: DC
  Administered 2021-05-24 – 2021-05-25 (×3): 6.25 mg via ORAL
  Filled 2021-05-24 (×2): qty 1
  Filled 2021-05-24: qty 2

## 2021-05-24 MED ORDER — ALTEPLASE 2 MG IJ SOLR: 2.0000 mg | Freq: Once | INTRAMUSCULAR | Status: DC | PRN

## 2021-05-24 MED ORDER — ALLOPURINOL 300 MG PO TABS
300.0000 mg | ORAL_TABLET | Freq: Every morning | ORAL | Status: DC
  Administered 2021-05-24 – 2021-05-25 (×2): 300 mg via ORAL
  Filled 2021-05-24: qty 1
  Filled 2021-05-24: qty 3
  Filled 2021-05-24: qty 1

## 2021-05-24 MED ORDER — LIDOCAINE HCL (CARDIAC) PF 100 MG/5ML IV SOSY
PREFILLED_SYRINGE | INTRAVENOUS | Status: DC | PRN
  Administered 2021-05-24: 60 mg via INTRAVENOUS

## 2021-05-24 MED ORDER — LATANOPROST 0.005 % OP SOLN
1.0000 [drp] | Freq: Every day | OPHTHALMIC | Status: DC
  Administered 2021-05-25: 1 [drp] via OPHTHALMIC
  Filled 2021-05-24 (×2): qty 2.5

## 2021-05-24 MED ORDER — SILDENAFIL CITRATE 20 MG PO TABS
20.0000 mg | ORAL_TABLET | Freq: Three times a day (TID) | ORAL | Status: DC
  Administered 2021-05-24 – 2021-05-25 (×3): 20 mg via ORAL
  Filled 2021-05-24 (×8): qty 1

## 2021-05-24 MED ORDER — INSULIN ASPART 100 UNIT/ML IJ SOLN
0.0000 [IU] | INTRAMUSCULAR | Status: DC
  Administered 2021-05-24: 1 [IU] via SUBCUTANEOUS

## 2021-05-24 MED ORDER — LEVOTHYROXINE SODIUM 75 MCG PO TABS
150.0000 ug | ORAL_TABLET | Freq: Every day | ORAL | Status: DC
Start: 2021-05-24 — End: 2021-05-25
  Administered 2021-05-24 – 2021-05-25 (×2): 150 ug via ORAL
  Filled 2021-05-24 (×2): qty 2

## 2021-05-24 MED ORDER — PHENYLEPHRINE 40 MCG/ML (10ML) SYRINGE FOR IV PUSH (FOR BLOOD PRESSURE SUPPORT)
PREFILLED_SYRINGE | INTRAVENOUS | Status: DC | PRN
  Administered 2021-05-24 (×2): 120 ug via INTRAVENOUS
  Administered 2021-05-24: 80 ug via INTRAVENOUS

## 2021-05-24 MED ORDER — CHLORHEXIDINE GLUCONATE CLOTH 2 % EX PADS
6.0000 | MEDICATED_PAD | Freq: Every day | CUTANEOUS | Status: DC
  Administered 2021-05-25: 6 via TOPICAL

## 2021-05-24 MED ORDER — ONDANSETRON HCL 4 MG/2ML IJ SOLN: 4.0000 mg | Freq: Four times a day (QID) | INTRAMUSCULAR | Status: DC | PRN

## 2021-05-24 MED ORDER — EPHEDRINE SULFATE-NACL 50-0.9 MG/10ML-% IV SOSY
PREFILLED_SYRINGE | INTRAVENOUS | Status: DC | PRN
  Administered 2021-05-24: 10 mg via INTRAVENOUS

## 2021-05-24 MED ORDER — DIPHENHYDRAMINE HCL 25 MG PO CAPS
50.0000 mg | ORAL_CAPSULE | Freq: Once | ORAL | Status: AC
  Administered 2021-05-24: 50 mg via ORAL
  Filled 2021-05-24: qty 2

## 2021-05-24 MED ORDER — PENTAFLUOROPROP-TETRAFLUOROETH EX AERO: 1.0000 "application " | INHALATION_SPRAY | CUTANEOUS | Status: DC | PRN

## 2021-05-24 MED ORDER — LIDOCAINE-PRILOCAINE 2.5-2.5 % EX CREA
1.0000 "application " | TOPICAL_CREAM | CUTANEOUS | Status: DC | PRN
  Filled 2021-05-24: qty 5

## 2021-05-24 MED ORDER — PROPOFOL 500 MG/50ML IV EMUL
INTRAVENOUS | Status: DC | PRN
Start: 2021-05-24 — End: 2021-05-24
  Administered 2021-05-24: 150 ug/kg/min via INTRAVENOUS

## 2021-05-24 MED ORDER — FENTANYL CITRATE PF 50 MCG/ML IJ SOSY
25.0000 ug | PREFILLED_SYRINGE | INTRAMUSCULAR | Status: DC | PRN
  Administered 2021-05-24: 50 ug via INTRAVENOUS
  Filled 2021-05-24 (×2): qty 1

## 2021-05-24 MED ORDER — LIDOCAINE HCL (PF) 1 % IJ SOLN
5.0000 mL | INTRAMUSCULAR | Status: DC | PRN
  Filled 2021-05-24: qty 5

## 2021-05-24 NOTE — Op Note (Signed)
[facility] Patient Name: Patricia Mata Procedure Date : 05/24/2021 MRN: [id number] Attending MD: Ronnette Juniper , MD Date of Birth: 1955-08-18 CSN: [id number] Age: 65 Admit Type: Inpatient Procedure:                Small bowel enteroscopy Indications:              Acute post hemorrhagic anemia, Melena, Hematemesis Providers:                Ronnette Juniper, MD, Doristine Johns, RN, Tyna Jaksch                            Technician Referring MD:             [facility] Medicines:                Monitored Anesthesia Care Complications:            No immediate complications. Estimated Blood Loss:     Estimated blood loss: none. Procedure:                Pre-Anesthesia Assessment:                           - Prior to the procedure, a History and Physical                            was performed, and patient medications and                            allergies were reviewed. The patient's tolerance of                            previous anesthesia was also reviewed. The risks                            and benefits of the procedure and the sedation                            options and risks were discussed with the patient.                            All questions were answered, and informed consent                            was obtained. Prior Anticoagulants: The patient has                            taken no previous anticoagulant or antiplatelet                            agents. ASA Grade Assessment: III - A patient with                            severe systemic disease. After reviewing the risks  and benefits, the patient was deemed in                            satisfactory condition to undergo the procedure.                           After obtaining informed consent, the endoscope was                            passed under direct vision. Throughout the                            procedure, the patient's blood pressure, pulse, and                             oxygen saturations were monitored continuously. The                            PCF-H190TL ([id number] Olympus slim colonoscope was                            introduced through the mouth and advanced to the                            jejunum, to the 150 cm mark (from the incisors).                            The small bowel enteroscopy was accomplished                            without difficulty. The patient tolerated the                            procedure well. Scope In: Scope Out: Findings:      The esophagus was normal.      Retained food particles ?medication, white powder like material noted in       distal esophagus, easily washable.      Small amount of retained food ?medication was found in the gastric body.      A single medium angioectasia with no bleeding was found in the gastric       body. Coagulation for tissue destruction using argon plasma at 0.5       liters/minute and 20 watts was successful.      Diffuse moderate mucosal changes characterized by black discoloration       were found in the duodenal bulb, in the first portion of the duodenum,       in the second portion of the duodenum, in the third portion of the       duodenum, in the fourth portion of the duodenum and in the entire       duodenum.      Diffuse moderate mucosal changes characterized by black discoloration       were found in the jejunum upto 150 cm from insertion.      No AVM noted in the small bowel. Impression:               -  Normal esophagus.                           - Small amount of retained food ?medication in the                            stomach.                           - A single non-bleeding angioectasia in the                            stomach. Treated with argon plasma coagulation                            (APC).                           - Mucosal changes in the duodenum.                           - Mucosal changes in the jejunum.                           -  No specimens collected. Moderate Sedation:      Patient did not receive moderate sedation for this procedure, but       instead received monitored anesthesia care. Recommendation:           - Renal diet.                           - Use Protonix (pantoprazole) 40 mg PO daily                            indefinitely. Procedure Code(s):        --- Professional ---                           [phone], Small intestinal endoscopy, enteroscopy                            beyond second portion of duodenum, not including                            ileum; with ablation of tumor(s), polyp(s), or                            other lesion(s) not amenable to removal by hot                            biopsy forceps, bipolar cautery or snare technique Diagnosis Code(s):        --- Professional ---                           F16.3WGY, Foreign body in stomach, initial encounter  K31.819, Angiodysplasia of stomach and duodenum                            without bleeding                           K31.89, Other diseases of stomach and duodenum                           K63.89, Other specified diseases of intestine                           D62, Acute posthemorrhagic anemia                           K92.1, Melena (includes Hematochezia)                           K92.0, Hematemesis CPT copyright 2019 American Medical Association. All rights reserved. The codes documented in this report are preliminary and upon coder review may  be revised to meet current compliance requirements. Ronnette Juniper, MD 05/24/2021 2:39:22 PM This report has been signed electronically. Number of Addenda: 0

## 2021-05-24 NOTE — H&P (Signed)
History and Physical    Patricia Mata:[id number] DOB: 1956-02-21 DOA: 05/24/2021  PCP: [name], FNP  Patient coming from: Home  I have personally briefly reviewed patient's old medical records in [place]  Chief Complaint: CP  HPI: Patricia Mata is a 65 y.o. female with medical history significant of ESRD on HD, DM2, HTN, PAF not on AC, severe AS.  Pt with h/o recurrent GIBs recently secondary to AVMs.  Last scopes were in Aug.  Pt recently admitted to hospital 10/30-11/1 with UGIB.  GI saw pt, elected to try and treat medically and not repeat scope at that time.  Though Dr. Alessandra Bevels did note in his recs "If ongoing drop in hemoglobin, may consider repeating push enteroscopy".  Pt not feeling well for past 24h.  Night before last started having diarrhea, by morning BRBPR followed by melena.  Evening yesterday had hematemesis of bright red blood followed by coffee ground emesis.  Today developed CP.  She states she typically gets this severe L sided CP with radiation to L arm whenever her hemoglobin gets low.  This prompted her to come to ED.  No abd pain.   ED Course: HGB 5.5.  Trops 61 and 64 c/w baseline.  2u PRBC transfusion ordered  Hospitalist asked to admit.   Review of Systems: As per HPI, otherwise all review of systems negative.  Past Medical History:  Diagnosis Date   Acute renal failure (ARF) ([place]) 06/08/2018   TTHS- [place]   Carotid artery disease [facility])    Chronic diastolic (congestive) heart failure (HCC)    Colon cancer ([place]) 2008   Diabetes mellitus ([place])    Dyspnea    ESRD on hemodialysis ([place])    Gout 09/08/2018   Heart attack ([place])    Heart murmur    Hypertension    Hypothyroidism    Macrocytic anemia    MDS (myelodysplastic syndrome) (HCC)    Moderate mitral stenosis    PAD (peripheral artery disease) (HCC)    PAF (paroxysmal atrial fibrillation) (HCC)    Pulmonary hypertension (HCC)    Renal artery  stenosis (HCC)    a. >60% R renal artery stenosis by duplex 2021, managed medically.   Severe aortic stenosis    Transfusion-dependent anemia     Past Surgical History:  Procedure Laterality Date   AV FISTULA PLACEMENT Right 09/26/2020   Procedure: RIGHT ARM ARTERIOVENOUS (AV) FISTULA CREATION;  Surgeon: Waynetta Sandy, MD;  Location: [facility];  Service: Vascular;  Laterality: Right;  Monitor Anesthesia Care with Regional Block to Right Arm    [place] Right 12/13/2020   Procedure: RIGHT UPPER EXTREMITY ARTERIOVENOUS FISTULA TRANSPOSITION;  Surgeon: Waynetta Sandy, MD;  Location: [facility];  Service: Vascular;  Laterality: Right;   COLON SURGERY     COLONOSCOPY WITH PROPOFOL N/A 02/22/2021   Procedure: COLONOSCOPY WITH PROPOFOL;  Surgeon: Otis Brace, MD;  Location: [facility];  Service: Gastroenterology;  Laterality: N/A;   DEBRIDMENT OF DECUBITUS ULCER N/A 06/12/2018   Procedure: DEBRIDMENT OF DECUBITUS ULCER;  Surgeon: Wallace Going, DO;  [facility]: [facility];  Service: Plastics;  Laterality: N/A;   ENTEROSCOPY N/A 02/22/2021   Procedure: ENTEROSCOPY;  Surgeon: Otis Brace, MD;  [facility];  Service: Gastroenterology;  Laterality: N/A;   ESOPHAGOGASTRODUODENOSCOPY N/A 09/01/2020   Procedure: ESOPHAGOGASTRODUODENOSCOPY (EGD);  Surgeon: Clarene Essex, MD;  Location: [facility];  Service: Endoscopy;  Laterality: N/A;   GIVENS CAPSULE STUDY N/A 09/01/2020  Procedure: GIVENS CAPSULE STUDY;  Surgeon: Clarene Essex, MD;  [facility];  Service: Endoscopy;  Laterality: N/A;   HEMOSTASIS CLIP PLACEMENT  02/22/2021   Procedure: HEMOSTASIS CLIP PLACEMENT;  Surgeon: Otis Brace, MD;  Location: [facility];  Service: Gastroenterology;;   HOT HEMOSTASIS N/A 02/22/2021   Procedure: HOT HEMOSTASIS (ARGON PLASMA COAGULATION/BICAP);  Surgeon: Otis Brace, MD;  Location: [facility];  Service: Gastroenterology;  Laterality: N/A;    IR FLUORO GUIDE CV LINE RIGHT  10/11/2020   IR FLUORO GUIDE PORT INSERTION RIGHT  04/02/2017   IR REMOVAL TUN ACCESS W/ PORT W/O FL MOD SED  03/16/2019   IR US GUIDE VASC ACCESS RIGHT  04/02/2017   IR US GUIDE VASC ACCESS RIGHT  10/11/2020   LIGATION OF COMPETING BRANCHES OF ARTERIOVENOUS FISTULA Right 12/13/2020   Procedure: LIGATION OF COMPETING BRANCHES OF RIGHT UPPER EXTREMITY ARTERIOVENOUS FISTULA;  Surgeon: Waynetta Sandy, MD;  Location: [facility];  Service: Vascular;  Laterality: Right;   POLYPECTOMY  02/22/2021   Procedure: POLYPECTOMY;  Surgeon: Otis Brace, MD;  Location: [facility];  Service: Gastroenterology;;   RIGHT HEART CATH N/A 04/23/2019   Procedure: RIGHT HEART CATH;  Surgeon: Jolaine Artist, MD;  Location: [facility];  Service: Cardiovascular;  Laterality: N/A;   RIGHT/LEFT HEART CATH AND CORONARY ANGIOGRAPHY N/A 05/21/2018   Procedure: RIGHT/LEFT HEART CATH AND CORONARY ANGIOGRAPHY;  Surgeon: Nelva Bush, MD;  Location: [facility];  Service: Cardiovascular;  Laterality: N/A;   RIGHT/LEFT HEART CATH AND CORONARY ANGIOGRAPHY N/A 10/13/2020   Procedure: RIGHT/LEFT HEART CATH AND CORONARY ANGIOGRAPHY;  Surgeon: Jolaine Artist, MD;  Location: [facility];  Service: Cardiovascular;  Laterality: N/A;   TUBAL LIGATION       reports that she quit smoking about 25 years ago. Her smoking use included cigarettes. She has a 5.00 pack-year smoking history. She has quit using smokeless tobacco. She reports current alcohol use of about 1.0 standard drink per week. She reports that she does not use drugs.  Allergies  Allergen Reactions   Lactose Intolerance (Gi) Diarrhea   Morphine And Related Itching   Sulfa Antibiotics Rash and Other (See Comments)    Blisters, also    Family History  Problem Relation Age of Onset   Hypertension Mother    Diabetes Mother    Cervical cancer Mother    Heart attack Father    Hypertension Sister     Hypertension Brother    Hypertension Sister    Hypertension Sister    Prostate cancer Brother    HIV/AIDS Brother      Prior to Admission medications   Medication Sig Start Date End Date Taking? Authorizing Provider  acetaminophen (TYLENOL) 500 MG tablet Take 1,000 mg by mouth every 6 (six) hours as needed (pain).    [provider]  allopurinol (ZYLOPRIM) 300 MG tablet Take 300 mg by mouth every morning. 07/05/20   [provider]  amiodarone (PACERONE) 200 MG tablet Take 1 tablet by mouth once daily Patient taking differently: Take 200 mg by mouth every morning. 05/07/21   Fenton, Clint R, PA  atorvastatin (LIPITOR) 40 MG tablet Take 1 tablet by mouth once daily Patient taking differently: Take 40 mg by mouth every morning. 11/13/20   [name], FNP  blood glucose meter kit and supplies Dispense based on patient and insurance preference. Use up to four times daily as directed. (FOR ICD-10 E10.9, E11.9). 11/04/18   [name], [place]  Blood Glucose Monitoring Suppl (ONETOUCH ULTRALINK) w/Device KIT Check blood sugars twice daily E11.9 11/30/19   [name], FNP  carvedilol (COREG) 6.25 MG tablet Take 1 tablet (6.25 mg total) by mouth 2 (two) times daily with a meal. Patient taking differently: Take 6.25 mg by mouth See admin instructions. Take one tablet (6.25 mg) by mouth in the morning and at supper on Sunday,  Tuesday, Thursday, Saturday (non-dialysis days); on Monday, Wednesday and Friday (dialysis days) - take one tablet (6.25 mg) after dialysis as needed for SBP >140, then take one tablet (6.25 mg) with dinner (5pm) and at bedtime. 04/23/21   Thurnell Lose, MD  diphenhydrAMINE (BENADRYL) 25 MG tablet Take 25 mg by mouth every 8 (eight) hours as needed for itching.    [provider]  ethyl chloride spray Apply 1 application topically See admin instructions. Apply topically to dialysis site if there is no time to use emla cream 03/01/21   [provider]  glucose blood test strip check blood sugars twice daily Dx code E11.22 03/02/20   [name], FNP  hydrALAZINE (APRESOLINE) 100 MG tablet Take 1 tablet (100 mg total) by mouth 3 (three) times daily. Patient taking differently: Take 100 mg by mouth See admin instructions. Take one tablet (100 mg) by mouth three times daily - morning, dinner and bedtime 09/27/20   Lorretta Harp, MD  Insulin Glargine [facility]) 100 UNIT/ML Inject 20 Units into the skin daily as needed (only if blood sugar over 150).    [provider]  Insulin Pen Needle 29G X 5MM MISC Use as directed 06/16/17   Bonnielee Haff, MD  Lancets (ONETOUCH DELICA PLUS [id number]) [place] check blood sugars twice daily Dx code: E11.22 03/02/20   [name], FNP  latanoprost (XALATAN) 0.005 % ophthalmic solution Place 1 drop into both eyes at bedtime.     [provider]  levothyroxine (SYNTHROID) 150 MCG tablet TAKE 1 TABLET BY MOUTH ONCE DAILY BEFORE BREAKFAST Patient taking differently: Take 150 mcg by mouth daily before breakfast. 04/16/21   [name], FNP  lidocaine-prilocaine (EMLA) cream Apply 1 application topically See admin instructions. Apply to dialysis site 30 minutes before arriving at dialysis on Tuesday, Thursday, Saturday 01/23/21   [provider]  linagliptin (TRADJENTA) 5 MG TABS tablet Take 1 tablet (5 mg total) by mouth daily. 04/05/21   [name], FNP  Methoxy PEG-Epoetin Beta (MIRCERA IJ) Mircera 05/03/21 05/02/22  [provider]  Multiple Vitamin (MULTIVITAMIN WITH MINERALS) TABS tablet Take 1 tablet by mouth every morning. Centrum Silver 50+    [provider]  omeprazole (PRILOSEC) 40 MG capsule Take 1 capsule (40 mg total) by mouth 2 (two) times daily with a meal. 05/15/21   Pokhrel, Laxman, MD  polyethylene glycol (MIRALAX / GLYCOLAX) 17 g packet Take 17 g by mouth daily as needed for moderate constipation. 01/14/21   Geradine Girt, DO   sildenafil (REVATIO) 20 MG tablet TAKE 1 TABLET BY MOUTH THREE TIMES DAILY Patient taking differently: Take 20 mg by mouth See admin instructions. Take one tablet (20 mg) by mouth three times daily - morning, supper and bedtime 02/05/21   Sherren Mocha, MD    Physical Exam: Vitals:   05/24/21 0400 05/24/21 0459 05/24/21 0509 05/24/21 0514  BP: (!) 149/55 (!) 163/75 140/61 140/61  Pulse: 76 84 84 84  Resp: _0 Temp:  97.8 F (36.6 C) 98 F (36.7 C) 98 F (36.7  C)  TempSrc:  Oral Oral Oral  SpO2: 100%  100% 100%  Weight:      Height:        Constitutional: Uncomfortable with CP. Eyes: PERRL, lids and conjunctivae normal ENMT: Mucous membranes are moist. Posterior pharynx clear of any exudate or lesions.Normal dentition.  Neck: normal, supple, no masses, no thyromegaly Respiratory: clear to auscultation bilaterally, no wheezing, no crackles. Normal respiratory effort. No accessory muscle use.  Cardiovascular: Murmur present Abdomen: no tenderness, no masses palpated. No hepatosplenomegaly. Bowel sounds positive.  Musculoskeletal: no clubbing / cyanosis. No joint deformity upper and lower extremities. Good ROM, no contractures. Normal muscle tone.  Skin: no rashes, lesions, ulcers. No induration Neurologic: CN 2-12 grossly intact. Sensation intact, DTR normal. Strength 5/5 in all 4.  Psychiatric: Normal judgment and insight. Alert and oriented x 3. Normal mood.    Labs on Admission: I have personally reviewed following labs and imaging studies  CBC: Recent Labs  Lab 05/24/21 0230  WBC 6.4  NEUTROABS 4.7  HGB 5.5*  HCT 17.5*  MCV 96.2  PLT 027   Basic Metabolic Panel: Recent Labs  Lab 05/24/21 0230  NA 136  K 3.7  CL 97*  CO2 24  GLUCOSE 129*  BUN 59*  CREATININE 5.65*  CALCIUM 9.2   GFR: Estimated Creatinine Clearance: 10.4 mL/min (A) (by C-G formula based on SCr of 5.65 mg/dL (H)). Liver Function Tests: Recent Labs  Lab 05/24/21 0230  AST 45*   ALT 28  ALKPHOS 91  BILITOT 0.9  PROT 5.9*  ALBUMIN 3.0*   No results for input(s): LIPASE, AMYLASE in the last 168 hours. No results for input(s): AMMONIA in the last 168 hours. Coagulation Profile: Recent Labs  Lab 05/24/21 0344  INR 1.2   Cardiac Enzymes: No results for input(s): CKTOTAL, CKMB, CKMBINDEX, TROPONINI in the last 168 hours. BNP (last 3 results) No results for input(s): PROBNP in the last 8760 hours. HbA1C: No results for input(s): HGBA1C in the last 72 hours. CBG: No results for input(s): GLUCAP in the last 168 hours. Lipid Profile: No results for input(s): CHOL, HDL, LDLCALC, TRIG, CHOLHDL, LDLDIRECT in the last 72 hours. Thyroid Function Tests: No results for input(s): TSH, T4TOTAL, FREET4, T3FREE, THYROIDAB in the last 72 hours. Anemia Panel: No results for input(s): VITAMINB12, FOLATE, FERRITIN, TIBC, IRON, RETICCTPCT in the last 72 hours. Urine analysis:    Component Value Date/Time   COLORURINE AMBER (A) 05/14/2021 0800   APPEARANCEUR HAZY (A) 05/14/2021 0800   LABSPEC 1.018 05/14/2021 0800   PHURINE 5.0 05/14/2021 0800   GLUCOSEU NEGATIVE 05/14/2021 0800   HGBUR NEGATIVE 05/14/2021 0800   BILIRUBINUR NEGATIVE 05/14/2021 0800   BILIRUBINUR negative 05/11/2020 1152   KETONESUR NEGATIVE 05/14/2021 0800   PROTEINUR 30 (A) 05/14/2021 0800   UROBILINOGEN 0.2 05/11/2020 1152   NITRITE NEGATIVE 05/14/2021 0800   LEUKOCYTESUR MODERATE (A) 05/14/2021 0800    Radiological Exams on Admission: DG ABD ACUTE 2+V W 1V CHEST  Result Date: 05/24/2021 CLINICAL DATA:  Chest pain, vomiting EXAM: DG ABDOMEN ACUTE WITH 1 VIEW CHEST COMPARISON:  Chest radiographs dated 05/05/2021 FINDINGS: Lungs are clear.  No pleural effusion or pneumothorax. The heart is normal in size.  Thoracic aortic atherosclerosis. Nonobstructive bowel gas pattern. No evidence of free air under the diaphragm on the upright view. Visualized osseous structures are within normal limits.  IMPRESSION: Negative abdominal radiographs.  No acute cardiopulmonary disease. Electronically Signed   By: Julian Hy M.D.   On:  05/24/2021 03:34    EKG: Independently reviewed.  Assessment/Plan Principal Problem:   UGIB (upper gastrointestinal bleed) Active Problems:   Essential hypertension   Aortic stenosis, severe   Paroxysmal atrial fibrillation (HCC)   Pulmonary hypertension (HCC)   DM2 (diabetes mellitus, type 2) (HCC)   ESRD (end stage renal disease) (HCC)   Acute blood loss anemia    UGIB causing ABLA - Probably AVMs again Protonix IV bolus and infusion Transfusing 2u PRBC, likely will need to transfuse slowly given h/o severe AS Message sent to GI for AM consult Tele monitor H/H Q6H NPO PAF - Not on AC (presumably due to recurrent GIBs) Continue amiodarone Cont BB CP - Presumably demand ischemia / symptomatic anemia related Trop today c/w her baseline Fentanyl PRN DM2 - Cont linagliptin Very sensitive SSI Q4H ESRD - Due for dialysis today No emergent indications at the moment, but getting 2u PRBC transfusion Call nephrology in AM HTN - Cont Coreg Cont hydralazine PAH - Cont Sildenafil Hypothyroidism - Cont synthroid  DVT prophylaxis: SCDs Code Status: Full Family Communication: No family in room Disposition Plan: Home after HGB stable Consults called: Message sent to Dr. Therisa Doyne / Dr. Watt Climes for AM GI consult Admission status: Admit to inpatient  Severity of Illness: The appropriate patient status for this patient is INPATIENT. Inpatient status is judged to be reasonable and necessary in order to provide the required intensity of service to ensure the patient's safety. The patient's presenting symptoms, physical exam findings, and initial radiographic and laboratory data in the context of their chronic comorbidities is felt to place them at high risk for further clinical deterioration. Furthermore, it is not anticipated that the patient will be  medically stable for discharge from the hospital within 2 midnights of admission.   IP status due to GIB with ABLA, HGB 5.5, need for transfusions.  Likely need for endoscopy.  * I certify that at the point of admission it is my clinical judgment that the patient will require inpatient hospital care spanning beyond 2 midnights from the point of admission due to high intensity of service, high risk for further deterioration and high frequency of surveillance required.*   Lavonne Cass M. DO [facility]  How to contact the [facility] Attending or Consulting provider [place] or covering provider during after hours [place], for this patient?  Check the care team in [facility] and look for a) attending/consulting [facility] provider listed and b) the [facility] team listed Log into [email or web address]  Amion Physician Scheduling and messaging for groups and whole hospitals  On call and physician scheduling software for group practices, residents, hospitalists and other medical providers for call, clinic, rotation and shift schedules. OnCall [employer] is a hospital-wide system for scheduling doctors and paging doctors on call. EasyPlot is for scientific plotting and data analysis.  [email or web address]  and use [facility]'s universal password to access. If you do not have the password, please contact the hospital operator.  Locate the [facility] provider you are looking for under [facility] and page to a number that you can be directly reached. If you still have difficulty reaching the provider, please page the [facility] (Director on Call) for the Hospitalists listed on amion for assistance.  05/24/2021, 5:22 AM

## 2021-05-24 NOTE — Anesthesia Preprocedure Evaluation (Signed)
Anesthesia Evaluation  Patient identified by MRN, date of birth, ID band Patient awake    Reviewed: Allergy & Precautions, NPO status , Patient's Chart, lab work & pertinent test results  Airway Mallampati: III  TM Distance: >3 FB     Dental  (+) Dental Advisory Given   Pulmonary former smoker,    breath sounds clear to auscultation       Cardiovascular hypertension, Pt. on medications + Past MI, + Peripheral Vascular Disease and +CHF  + Valvular Problems/Murmurs  Rhythm:Regular Rate:Normal     Neuro/Psych negative neurological ROS     GI/Hepatic Neg liver ROS, GERD  ,  Endo/Other  diabetesHypothyroidism   Renal/GU ESRF and DialysisRenal disease     Musculoskeletal  (+) Arthritis ,   Abdominal   Peds  Hematology  (+) anemia ,   Anesthesia Other Findings   Reproductive/Obstetrics                             Anesthesia Physical Anesthesia Plan  ASA: 4  Anesthesia Plan: MAC   Post-op Pain Management:    Induction:   PONV Risk Score and Plan: 2 and Propofol infusion  Airway Management Planned: Natural Airway and Simple Face Mask  Additional Equipment:   Intra-op Plan:   Post-operative Plan:   Informed Consent: I have reviewed the patients History and Physical, chart, labs and discussed the procedure including the risks, benefits and alternatives for the proposed anesthesia with the patient or authorized representative who has indicated his/her understanding and acceptance.       Plan Discussed with: CRNA  Anesthesia Plan Comments:         Anesthesia Quick Evaluation

## 2021-05-24 NOTE — Progress Notes (Addendum)
[facility] Progress Note   Background: Patricia Mata is a 65 Y/O female with ESRD on hemodialysis T,Th,S at [facility]. 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 Alen Mata requiring q 2 weekly blood transfusions in addition to ESA given at OP HD units. H/O Stage III colon cancer S/P hemicolectomy 2008. Recent admission 10/08-10/04/2021 for PAF with RVR. Admitted again 10/30-11/07/2020.   Patient presented to ED this AM with HGB 5.5. and C/O L sided chest pain. She is now S/P 1 unit PRBCs awaiting 2nd unit. GI has been consulted. Going for endoscopy/push enteroscopy today. Labs unremarkable for HD other than HGB. Troponin low and flat. CXR unremarkable.   Subjective: Seen in ED. Says CP has subsided and she feels much better.      Objective Vitals:   05/24/21 0522 05/24/21 0615 05/24/21 0752 05/24/21 0800  BP: (!) 162/48 (!) 157/35 (!) 163/45 (!) 174/43  Pulse: 83 80 78 75  Resp: 17 15 16 19   Temp: 97.8 F (36.6 C)     TempSrc: Oral     SpO2: 100% 98% 100% 100%  Weight:      Height:       Physical Exam General: WN,WD female in NAD Heart: X4,J2 RRR. 2/6 systolic flow M Lungs: CTAB Abdomen: Active BS Extremities:No LE edema Dialysis Access: RR AVF + T/B    Additional Objective Labs: Basic Metabolic Panel: Recent Labs  Lab 05/24/21 0230  NA 136  K 3.7  CL 97*  CO2 24  GLUCOSE 129*  BUN 59*  CREATININE 5.65*  CALCIUM 9.2   Liver Function Tests: Recent Labs  Lab 05/24/21 0230  AST 45*  ALT 28  ALKPHOS 91  BILITOT 0.9  PROT 5.9*  ALBUMIN 3.0*   No results for input(s): LIPASE, AMYLASE in the last 168 hours. CBC: Recent Labs  Lab 05/24/21 0230  WBC 6.4  NEUTROABS 4.7  HGB 5.5*  HCT 17.5*  MCV 96.2  PLT 170   Blood Culture    Component Value Date/Time   SDES  09/24/2019 1144    TRACHEAL ASPIRATE Performed at [facility], [place] [address] [place],  [place] [address]    SPECREQUEST  09/24/2019 1144    NONE Performed at [facility], [place] [address] [place], [place] [address]    CULT FEW [place] ALBICANS 09/24/2019 1144   REPTSTATUS 09/27/2019 FINAL 09/24/2019 1144    Cardiac Enzymes: No results for input(s): CKTOTAL, CKMB, CKMBINDEX, TROPONINI in the last 168 hours. CBG: No results for input(s): GLUCAP in the last 168 hours. Iron Studies: No results for input(s): IRON, TIBC, TRANSFERRIN, FERRITIN in the last 72 hours. @lablastinr3 @ Studies/Results: DG ABD ACUTE 2+V W 1V CHEST  Result Date: 05/24/2021 CLINICAL DATA:  Chest pain, vomiting EXAM: DG ABDOMEN ACUTE WITH 1 VIEW CHEST COMPARISON:  Chest radiographs dated 05/05/2021 FINDINGS: Lungs are clear.  No pleural effusion or pneumothorax. The heart is normal in size.  Thoracic aortic atherosclerosis. Nonobstructive bowel gas pattern. No evidence of free air under the diaphragm on the upright view. Visualized osseous structures are within normal limits. IMPRESSION: Negative abdominal radiographs.  No acute cardiopulmonary disease. Electronically Signed   By: Julian Hy M.D.   On: 05/24/2021 03:34   Medications:  sodium chloride     pantoprazole     pantoprazole      allopurinol  300 mg Oral q morning   amiodarone  200 mg Oral q morning  atorvastatin  40 mg Oral q morning   carvedilol  6.25 mg Oral BID WC   hydrALAZINE  100 mg Oral TID   insulin aspart  0-6 Units Subcutaneous Q4H   latanoprost  1 drop Both Eyes QHS   levothyroxine  150 mcg Oral QAC breakfast   linagliptin  5 mg Oral Daily   metoCLOPramide (REGLAN) injection  10 mg Intravenous Once   [START ON 05/27/2021] pantoprazole  40 mg Intravenous Q12H   sildenafil  20 mg Oral TID   Dialysis Orders: [facility] T,Th,S 4 hrs 180NRe 400/Autoflow 1.5 87.2 kg 2.0K/2.0 Ca AVF -Heparin 2000 units IV TIW -Mircera 200 mcg IV q 2 weeks (last dose 05/03/2021) -Calcitriol 0.25 mcg PO TIW  Assessment/Plan: 1.  UGIB/ABLA: HGB 5.5 H/O AVMs. GI consulted. Going to endo today. Per primary 2. Chest Pain in setting of low HGB: Troponin flat. Probably demand ischemia. Per primary 3. ESRD -T,Th,S HD later today after endoscopy. No heparin.  3. Anemia - As noted above. Last ESA 05/17/2021 max dose. Transfuse as needed.  4. Secondary hyperparathyroidism - No on binders, Hold VDRA- C Ca 10.  5. HTN/volume - Last HD 05/22/2021. Close to EDW. No evidence of volume overload by CXR. UF as tolerated.  6. Nutrition - NPO at present.  7. H/O PAF-not on anticoagulation. Per primary 8. H/O PAH 9. DMT2-per primary 10. MDS-follows with oncology.    Patricia Knab H. Stevens [place] NP-C 05/24/2021, 9:56 AM  Patricia Mata [phone]

## 2021-05-24 NOTE — ED Triage Notes (Signed)
Pt BIB EMS for left sided chest pain that "usually happens when her hemoglobin is low".PT reports rectal bleeding with hematemesis yesterday that stopped last night.    140/40 60HR 162 CBG  100% RA  NSR

## 2021-05-24 NOTE — Consult Note (Signed)
Referring Provider: [place] Primary Care Physician:  [name], FNP Primary Gastroenterologist:  Dr. Michail Sermon  Reason for Consultation:  symptomatic anemia and melena  HPI: Patricia Mata is a 65 y.o. female history of MDS requiring chronic transfusions with oncology since 2017, colon cancer s/p right hemicolectomy in 2008, H-pylori positive gastritis (2017), CKD, HFpEF, Afib not on coagulation due to repeated anemia/GIB, presents for symptomatic anemia and melena.   Patient normally has dialysis today. She was seen by GI recently for recurrent anemia and upper GI bleed on 05/14/2021, at that time elected conservative measures and supportive care.  Patient states she was in her normal state of health until about 1 PM yesterday started to have loose bowel movements every 2 hours.  9 PM began to have mixture of bright red blood and black loose stools so every 2 hours with some nausea and not feeling well.  4:30 AM patient had large volume fecal incontinence loose stools with black stool and bright red blood.  At that same time patient also had nausea with vomiting, had 4 episodes with bright red blood progressing to coffee-ground emesis. Patient started to have chest pain last night around 11 PM worsening around 1 AM.  Patient denies any chest pain or shortness of breath prior to this episode, states she always has the chest discomfort when her hemoglobin gets low. Baseline hemoglobin is around 9, presented to the ER with hemoglobin of 5.5.  2 PBRCs ordered.  Last bowel movement was 430 yesterday. Patient states she took 4 baby aspirin in the ambulance otherwise does not take any anti-inflammatories.  Patient is not on any blood thinners. Patient does have mixed drink at least once a month, last mixed drink was 1 day prior to this episode. Denies fever, chills, chest pain improving and no shortness of breath at this time. No leg swelling.  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  Treated with APC and 2 clips placement.  Also showed few scattered AVMs in the jejunum treated with APC.  Small duodenal polyp removed with a hot snare.  1 small AVM in the gastric body treated with APC.   -Colonoscopy showed fair prep but no evidence of active bleeding.   - EGD on September 01, 2020 which showed mild gastritis and abnormal mucosa in the duodenum (melanosis or iron stain )but no active bleeding.   Capsule endoscopy in February 2022 showed questionable abnormal proximal small bowel mucosa otherwise no active bleeding   EGD 05/2016: acute gastritis, H pylori positive per biopsy Colonoscopy 08/2019: one tubular adenoma, 2 hyperplastic polyps, repeat 3 years    Past Medical History:  Diagnosis Date   Acute renal failure (ARF) ([place]) 06/08/2018   TTHS- [place]   Carotid artery disease ([place])    Chronic diastolic (congestive) heart failure ([place])    Colon cancer ([place]) 2008   Diabetes mellitus ([place])    Dyspnea    ESRD on hemodialysis ([place])    Gout 09/08/2018   Heart attack ([place])    Heart murmur    Hypertension    Hypothyroidism    Macrocytic anemia    MDS (myelodysplastic syndrome) (H[facility])    Moderate mitral stenosis    PAD (peripheral artery disease) ([facility])    PAF (paroxysmal atrial fibrillation) ([place])    Pulmonary hypertension ([place])    Renal artery stenosis ([place])  a. >60% R renal artery stenosis by duplex 2021, managed medically.   Severe aortic stenosis    Transfusion-dependent anemia     Past Surgical History:  Procedure Laterality Date   AV FISTULA PLACEMENT Right 09/26/2020   Procedure: RIGHT ARM ARTERIOVENOUS (AV) FISTULA CREATION;  Surgeon: Waynetta Sandy, MD;  Location: [facility];  Service: Vascular;  Laterality: Right;  Monitor Anesthesia Care with Regional Block to Right Arm    [place] Right 12/13/2020   Procedure:  RIGHT UPPER EXTREMITY ARTERIOVENOUS FISTULA TRANSPOSITION;  Surgeon: Waynetta Sandy, MD;  Location: [facility];  Service: Vascular;  Laterality: Right;   COLON SURGERY     COLONOSCOPY WITH PROPOFOL N/A 02/22/2021   Procedure: COLONOSCOPY WITH PROPOFOL;  Surgeon: Otis Brace, MD;  Location: [facility];  Service: Gastroenterology;  Laterality: N/A;   DEBRIDMENT OF DECUBITUS ULCER N/A 06/12/2018   Procedure: DEBRIDMENT OF DECUBITUS ULCER;  Surgeon: Wallace Going, DO;  Location: [facility];  Service: Plastics;  Laterality: N/A;   ENTEROSCOPY N/A 02/22/2021   Procedure: ENTEROSCOPY;  Surgeon: Otis Brace, MD;  [facility];  Service: Gastroenterology;  Laterality: N/A;   ESOPHAGOGASTRODUODENOSCOPY N/A 09/01/2020   Procedure: ESOPHAGOGASTRODUODENOSCOPY (EGD);  Surgeon: Clarene Essex, MD;  Location: [facility];  Service: Endoscopy;  Laterality: N/A;   GIVENS CAPSULE STUDY N/A 09/01/2020   Procedure: GIVENS CAPSULE STUDY;  Surgeon: Clarene Essex, MD;  Location: [facility];  Service: Endoscopy;  Laterality: N/A;   HEMOSTASIS CLIP PLACEMENT  02/22/2021   Procedure: HEMOSTASIS CLIP PLACEMENT;  Surgeon: Otis Brace, MD;  Location: [facility];  Service: Gastroenterology;;   HOT HEMOSTASIS N/A 02/22/2021   Procedure: HOT HEMOSTASIS (ARGON PLASMA COAGULATION/BICAP);  Surgeon: Otis Brace, MD;  Location: [facility];  Service: Gastroenterology;  Laterality: N/A;   IR FLUORO GUIDE CV LINE RIGHT  10/11/2020   IR FLUORO GUIDE PORT INSERTION RIGHT  04/02/2017   IR REMOVAL TUN ACCESS W/ PORT W/O FL MOD SED  03/16/2019   IR US GUIDE VASC ACCESS RIGHT  04/02/2017   IR US GUIDE VASC ACCESS RIGHT  10/11/2020   LIGATION OF COMPETING BRANCHES OF ARTERIOVENOUS FISTULA Right 12/13/2020   Procedure: LIGATION OF COMPETING BRANCHES OF RIGHT UPPER EXTREMITY ARTERIOVENOUS FISTULA;  Surgeon: Waynetta Sandy, MD;  Location: [facility];  Service: Vascular;  Laterality: Right;   POLYPECTOMY   02/22/2021   Procedure: POLYPECTOMY;  Surgeon: Otis Brace, MD;  Location: [facility];  Service: Gastroenterology;;   RIGHT HEART CATH N/A 04/23/2019   Procedure: RIGHT HEART CATH;  Surgeon: Jolaine Artist, MD;  Location: [facility];  Service: Cardiovascular;  Laterality: N/A;   RIGHT/LEFT HEART CATH AND CORONARY ANGIOGRAPHY N/A 05/21/2018   Procedure: RIGHT/LEFT HEART CATH AND CORONARY ANGIOGRAPHY;  Surgeon: Nelva Bush, MD;  Location: [facility];  Service: Cardiovascular;  Laterality: N/A;   RIGHT/LEFT HEART CATH AND CORONARY ANGIOGRAPHY N/A 10/13/2020   Procedure: RIGHT/LEFT HEART CATH AND CORONARY ANGIOGRAPHY;  Surgeon: Jolaine Artist, MD;  Location: [facility];  Service: Cardiovascular;  Laterality: N/A;   TUBAL LIGATION      Prior to Admission medications   Medication Sig Start Date End Date Taking? Authorizing Provider  acetaminophen (TYLENOL) 500 MG tablet Take 1,000 mg by mouth every 6 (six) hours as needed (pain).   Yes [provider]  allopurinol (ZYLOPRIM) 300 MG tablet Take 300 mg by mouth every morning. 07/05/20  Yes [provider]  amiodarone (PACERONE) 200 MG tablet Take 1 tablet  by mouth once daily Patient taking differently: Take 200 mg by mouth every morning. 05/07/21  Yes Fenton, Clint R, PA  atorvastatin (LIPITOR) 40 MG tablet Take 1 tablet by mouth once daily Patient taking differently: Take 40 mg by mouth every morning. 11/13/20  Yes [name], FNP  blood glucose meter kit and supplies Dispense based on patient and insurance preference. Use up to four times daily as directed. (FOR ICD-10 E10.9, E11.9). 11/04/18  Yes [name], FNP  Blood Glucose Monitoring Suppl [facility]) w/Device KIT Check blood sugars twice daily E11.9 11/30/19  Yes [name], FNP  carvedilol (COREG) 6.25 MG tablet Take 1 tablet (6.25 mg total) by mouth 2 (two) times daily with a meal. 04/23/21  Yes Thurnell Lose, MD   diphenhydrAMINE (BENADRYL) 25 MG tablet Take 25 mg by mouth every 8 (eight) hours as needed for itching.   Yes [provider]  ethyl chloride spray Apply 1 application topically See admin instructions. Apply topically to dialysis site if there is no time to use emla cream 03/01/21  Yes [provider]  glucose blood test strip check blood sugars twice daily Dx code E11.22 03/02/20  Yes [name], FNP  hydrALAZINE (APRESOLINE) 100 MG tablet Take 1 tablet (100 mg total) by mouth 3 (three) times daily. Patient taking differently: Take 100 mg by mouth See admin instructions. Take one tablet (100 mg) by mouth three times daily - morning, dinner and bedtime 09/27/20  Yes Lorretta Harp, MD  Insulin Glargine [facility]) 100 UNIT/ML Inject 20 Units into the skin daily as needed (only if blood sugar over 150).   Yes [provider]  Insulin Pen Needle 29G X 5MM MISC Use as directed 06/16/17  Yes Bonnielee Haff, MD  Lancets (ONETOUCH DELICA PLUS [id number]) [place] check blood sugars twice daily Dx code: E11.22 03/02/20  Yes [name], FNP  latanoprost (XALATAN) 0.005 % ophthalmic solution Place 1 drop into both eyes at bedtime.    Yes [provider]  levothyroxine (SYNTHROID) 150 MCG tablet TAKE 1 TABLET BY MOUTH ONCE DAILY BEFORE BREAKFAST Patient taking differently: Take 150 mcg by mouth daily before breakfast. 04/16/21  Yes [name], FNP  lidocaine-prilocaine (EMLA) cream Apply 1 application topically See admin instructions. Apply to dialysis site 30 minutes before arriving at dialysis on Tuesday, Thursday, Saturday 01/23/21  Yes [provider]  linagliptin (TRADJENTA) 5 MG TABS tablet Take 1 tablet (5 mg total) by mouth daily. 04/05/21  Yes [name], FNP  Methoxy PEG-Epoetin Beta (MIRCERA IJ) Mircera 05/03/21 05/02/22 Yes [provider]  Multiple Vitamin (MULTIVITAMIN WITH MINERALS) TABS tablet Take 1 tablet by mouth every morning.  Centrum Silver 50+   Yes [provider]  omeprazole (PRILOSEC) 40 MG capsule Take 1 capsule (40 mg total) by mouth 2 (two) times daily with a meal. 05/15/21  Yes Pokhrel, Laxman, MD  polyethylene glycol (MIRALAX / GLYCOLAX) 17 g packet Take 17 g by mouth daily as needed for moderate constipation. 01/14/21  Yes Vann, Jessica U, DO  sildenafil (REVATIO) 20 MG tablet TAKE 1 TABLET BY MOUTH THREE TIMES DAILY Patient taking differently: Take 20 mg by mouth See admin instructions. Take one tablet (20 mg) by mouth three times daily - morning, supper and bedtime 02/05/21  Yes Sherren Mocha, MD    Scheduled Meds:  allopurinol  300 mg Oral q morning   amiodarone  200 mg Oral q morning   atorvastatin  40 mg Oral q morning  carvedilol  6.25 mg Oral BID WC   hydrALAZINE  100 mg Oral TID   insulin aspart  0-6 Units Subcutaneous Q4H   latanoprost  1 drop Both Eyes QHS   levothyroxine  150 mcg Oral QAC breakfast   linagliptin  5 mg Oral Daily   [START ON 05/27/2021] pantoprazole  40 mg Intravenous Q12H   sildenafil  20 mg Oral TID   Continuous Infusions:  sodium chloride     pantoprazole     pantoprazole     PRN Meds:.acetaminophen **OR** acetaminophen, diphenhydrAMINE, fentaNYL (SUBLIMAZE) injection, ondansetron **OR** ondansetron (ZOFRAN) IV  Allergies as of 05/24/2021 - Review Complete 05/24/2021  Allergen Reaction Noted   Lactose intolerance (gi) Diarrhea 09/27/2019   Morphine and related Itching 12/13/2020   Sulfa antibiotics Rash and Other (See Comments) 02/01/2016    Family History  Problem Relation Age of Onset   Hypertension Mother    Diabetes Mother    Cervical cancer Mother    Heart attack Father    Hypertension Sister    Hypertension Brother    Hypertension Sister    Hypertension Sister    Prostate cancer Brother    HIV/AIDS Brother     Social History   Socioeconomic History   Marital status: Married    Spouse name: Not on file   Number of children: Not on  file   Years of education: Not on file   Highest education level: Not on file  Occupational History   Occupation: retired  Tobacco Use   Smoking status: Former    Packs/day: 0.25    Years: 20.00    Pack years: 5.00    Types: Cigarettes    Quit date: 1997    Years since quitting: 25.8   Smokeless tobacco: Former  Scientific laboratory technician Use: Never used  Substance and Sexual Activity   Alcohol use: Yes    Alcohol/week: 1.0 standard drink    Types: 1 Standard drinks or equivalent per week    Comment: occ   Drug use: Never   Sexual activity: Not on file  Other Topics Concern   Not on file  Social History Narrative   Not on file   Social Determinants of Health   Financial Resource Strain: Low Risk    Difficulty of Paying Living Expenses: Not hard at all  Food Insecurity: No Food Insecurity   Worried About Charity fundraiser in the Last Year: Never true   [place] in the Last Year: Never true  Transportation Needs: No Transportation Needs   Lack of Transportation (Medical): No   Lack of Transportation (Non-Medical): No  Physical Activity: Inactive   Days of Exercise per Week: 0 days   Minutes of Exercise per Session: 0 min  Stress: No Stress Concern Present   Feeling of Stress : Not at all  Social Connections: Not on file  Intimate Partner Violence: Not on file    Review of Systems:  Review of Systems  Constitutional:  Positive for malaise/fatigue and weight loss (intentional 100 lbs over 2 years). Negative for chills and fever.  Respiratory:  Negative for cough and shortness of breath.   Cardiovascular:  Positive for chest pain. Negative for palpitations and leg swelling.  Gastrointestinal:  Positive for blood in stool, diarrhea, melena, nausea and vomiting. Negative for abdominal pain, constipation and heartburn.  Musculoskeletal:  Negative for falls.  Neurological:  Positive for weakness. Negative for focal weakness and loss of consciousness.  Psychiatric/Behavioral:  Negative for memory loss.    Physical Exam: Vital signs: Vitals:   05/24/21 0752 05/24/21 0800  BP: (!) 163/45 (!) 174/43  Pulse: 78 75  Resp: 16 19  Temp:    SpO2: 100% 100%     General:   Alert, in NAD Heart:  Regular rate and rhythm; loud holosystolic murmur Pulm: Clear anteriorly; no wheezing Abdomen:  Soft, Obese AB, skin exam with well healed vertical periumbilical scar, Normal bowel sounds. mild tenderness in the epigastrium. Without guarding and Without rebound, without hepatomegaly. Extremities:  Without edema. Neurologic:  Alert and  oriented x4;  grossly normal neurologically. Psych:  Alert and cooperative. Normal mood and affect.  GI:  Lab Results: Recent Labs    05/24/21 0230  WBC 6.4  HGB 5.5*  HCT 17.5*  PLT 170   BMET Recent Labs    05/24/21 0230  NA 136  K 3.7  CL 97*  CO2 24  GLUCOSE 129*  BUN 59*  CREATININE 5.65*  CALCIUM 9.2   LFT Recent Labs    05/24/21 0230  PROT 5.9*  ALBUMIN 3.0*  AST 45*  ALT 28  ALKPHOS 91  BILITOT 0.9   PT/INR Recent Labs    05/24/21 0344  LABPROT 14.7  INR 1.2    Studies/Results: DG ABD ACUTE 2+V W 1V CHEST  Result Date: 05/24/2021 CLINICAL DATA:  Chest pain, vomiting EXAM: DG ABDOMEN ACUTE WITH 1 VIEW CHEST COMPARISON:  Chest radiographs dated 05/05/2021 FINDINGS: Lungs are clear.  No pleural effusion or pneumothorax. The heart is normal in size.  Thoracic aortic atherosclerosis. Nonobstructive bowel gas pattern. No evidence of free air under the diaphragm on the upright view. Visualized osseous structures are within normal limits. IMPRESSION: Negative abdominal radiographs.  No acute cardiopulmonary disease. Electronically Signed   By: Julian Hy M.D.   On: 05/24/2021 03:34    Impression  Symptomatic anemia with melena and history of recurrent upper GI bleed Patient's had very thorough work-up in the past for GI bleeds. Normal colonoscopy 02/22/2021. Push  enteroscopy August 2022 with multiple AVMs requiring clips and APC. Baseline hemoglobin for patient appears to be around 9, in Er found to be 5.5 receiving 2 units packed red blood cells currently. WBC 6.4 HGB 5.5 MCV 96.2 Platelets 170   Chest pain Patient with chest pain and slight bump in troponin, likely demand ischemia with hemoglobin 5.5. Patient states chest pain is resolving at this time. Patient follows with Donnella Bi, Dr. Ronna Polio and Dr. Burt Knack. Patient has history of severe pulmonary hypertension, not oxygen dependent. Not candidate for TAVR appears euvolemic at this time Cardiac catheterization 10/2020 nonobstructive coronary artery disease.   ESRD (end stage renal disease) ([place]) Patient is due for dialysis today, will reach out to primary to discuss timing with enteroscopy scheduled today.   Essential hypertension Slightly elevated at this time, monitor prior to anesthesia.   Paroxysmal atrial fibrillation (HCC) Not on anticoagulation.    Plan  Plan for Enteroscopy today I thoroughly discussed the procedure to include nature, alternatives, benefits, and risks including but not limited to bleeding, perforation, infection, anesthesia/cardiac and pulmonary complications. Patient provides understanding and gave verbal consent to proceed.   Patient is due for dialysis today, will reach out to primary to discuss timing with enteroscopy scheduled today.  Protonix 40 mg IV BID.   NPO now   Continue daily CBC with transfusion as needed to maintain Hgb >7.   [facility] will follow.  LOS: 0 days   Vladimir Crofts  PA-C 05/24/2021, 8:13 AM  Contact #  [phone]

## 2021-05-24 NOTE — Plan of Care (Signed)
  Problem: Health Behavior/Discharge Planning: Goal: Ability to manage health-related needs will improve Outcome: Progressing   Problem: Education: Goal: Knowledge of General Education information will improve Description: Including pain rating scale, medication(s)/side effects and non-pharmacologic comfort measures Outcome: Progressing   

## 2021-05-24 NOTE — Transfer of Care (Signed)
Immediate Anesthesia Transfer of Care Note  Patient: Patricia Mata  Procedure(s) Performed: ENTEROSCOPY HOT HEMOSTASIS (ARGON PLASMA COAGULATION/BICAP)  Patient Location: Endoscopy Unit  Anesthesia Type:MAC  Level of Consciousness: awake  Airway & Oxygen Therapy: Patient Spontanous Breathing and Patient connected to face mask oxygen  Post-op Assessment: Report given to RN and Post -op Vital signs reviewed and stable  Post vital signs: Reviewed and stable  Last Vitals:  Vitals Value Taken Time  BP 158/26 05/24/21 1440  Temp    Pulse 62 05/24/21 1441  Resp 21 05/24/21 1441  SpO2 100 % 05/24/21 1441  Vitals shown include unvalidated device data.  Last Pain:  Vitals:   05/24/21 1406  TempSrc: Oral  PainSc:          Complications: No notable events documented.

## 2021-05-24 NOTE — Progress Notes (Signed)
PROGRESS NOTE  Patricia Mata  DOB: 07/13/1956  PCP: [name], [place] EYC:[id number]  DOA: 05/24/2021  LOS: 0 days  Hospital Day: 1  Chief Complaint  Patient presents with   Chest Pain    Brief narrative: Patricia Mata is a 65 y.o. female with PMH significant for DM2, HTN, ESRD on HD, chronic diastolic CHF, pulmonary artery hypertension, severe AS, PAF not on AC, carotid artery disease, myelodysplastic syndrome, with h/o recurrent GIBs recently secondary to AVMs. Patient was recently hospitalized 10/30-11/1 with upper GI bleeding.  He was treated medically and did not get scoped at that time.  Prior scope in August showed AVM with stigmata of bleeding in duodenum, got treated with APC and 2 clips placement.  She also had a scattered AVMs in the jejunum that required APC.  Colonoscopy at that time did not show any evidence of active bleeding.  In last 24 hours prior to presentation, patient had diarrhea, bright red blood per rectum followed by melena followed by hematemesis and coffee-ground emesis.  She also started getting chest pain and hence came to the ED.    In the ED, she was hemodynamically stable.  Hemoglobin was noted to be low at 5.5 Troponin 61 and 64  2 units of PRBC transfused. Admitted to hospitalist service GI consult was called.  Admitted to hospitalist service  Subjective: Patient was seen and examined this morning in the ED.  Pleasant elderly African-American female.  Lying on bed.  Not in distress.   Pending EGD and dialysis today. Chart reviewed Blood pressure running elevated up to 170s this morning  Assessment/Plan: Recurrent upper GI bleeding due to AVMs -Presented with BRBPR, melena, hematemesis and coffee-ground emesis and low hemoglobin.  Probably having recurrent GI bleeding due to AVMs, may be related to severe AS. -Last scope in August showed AVMs in duodenum and jejunum for which she received APC. -GI consulted.  Remains n.p.o. at this  time. -Currently on Protonix drip.  Acute on chronic blood loss anemia -Hemoglobin at baseline low and runs between 8 and 9.  Presented with low hemoglobin of 5.5 -2 units of PRBC transfused.  Ferritin high over 2000 probably because of requirement of frequent transfusions. -Continue to monitor hemoglobin. Recent Labs    06/11/20 1048 06/29/20 1030 07/09/20 0357 07/20/20 1042 08/31/20 1644 08/31/20 1700 09/01/20 0023 10/09/20 1656 10/10/20 0939 12/26/20 1515 01/03/21 0849 05/13/21 2314 05/14/21 0223 05/15/21 0449 05/15/21 1223 05/16/21 1208 05/24/21 0230  HGB  --    < >  --    < >  --   --    < >  --    < >  --    < >  --  7.1* 8.0* 8.8* 8.1* 5.5*  MCV  --    < >  --    < >  --   --    < >  --    < >  --    < >  --  88.1 88.4 88.5 89.4 96.2  VITAMINB12  --   --   --   --   --  1,334*  --   --   --   --   --   --  1,155*  --   --   --   --   FOLATE  --   --   --   --   --  37.0  --   --   --   --   --   --  15.7  --   --   --   --   FERRITIN [id number]*  --  1,549*  --   --  2,236*  --   --   --   --   --   --  2,121*  --   --   --   --   TIBC 209*  --  214*  --   --  266  --   --   --   --   --   --  209*  --   --   --   --   IRON 47  --  27*  --   --  248*  --   --   --   --   --   --  204*  --   --   --   --   RETICCTPCT  --   --   --   --  3.0  --   --  1.8  --  1.4  --  2.2 2.7  --   --   --   --    < > = values in this interval not displayed.   Chest pain/elevated troponin Peripheral artery disease Carotid artery disease -Patient states every time her hemoglobin drops down to start having chest pain.  Probably related to demand ischemia.  She has significant other vascular diseases including peripheral artery disease and carotid artery disease.  Unclear if she had ischemia evaluation in the past. -Continue statin.  Not on a blood thinner for recurrent GI bleeding Recent Labs    05/24/21 0230 05/24/21 0429  TROPONINIHS 61* 64*   Severe aortic stenosis -May need  consideration for TAVR which could help control AVM and GI bleeding as well -Defer to cardiology. She sees Dr. Rayann Heman as an outpatient.  Chronic diastolic CHF Essential hypertension -Continue Coreg 6.25 mg twice daily, hydralazine 100 mg 3 times daily  Pulmonary hypertension -Continue sildenafil 20 mg 3 times daily  Paroxysmal A. Fib -Continue amiodarone 200 mg daily and Coreg 6.25 mg twice daily -Not on anticoagulation due to recurrent GI bleeding  ESRD HD TTS -Nephrology consulted.  Type 2 diabetes mellitus -A1c 6.5 on 10/30 but could be spuriously low because of chronic anemia and frequent PRBC transfusion -Home meds include Basaglar 20 units daily, linagliptin 5 mg daily -Currently on linagliptin and sliding scale insulin.  Continue to monitor Recent Labs  Lab 05/24/21 1113  GLUCAP 95   Hypothyroidism -Cont synthroid 150 mcg daily  Mobility: Encourage ambulation Living condition: Lives at home.  Able to ambulate without assistance Goals of care:   Code Status: Full Code  Nutritional status: Body mass index is 32.61 kg/m.     Diet:  Diet Order             Diet NPO time specified Except for: Sips with Meds  Diet effective now                  DVT prophylaxis:  SCDs Start: 05/24/21 0458   Antimicrobials: None Fluid: None Consultants: GI Family Communication: None at bedside  Status is: Inpatient  Remains inpatient appropriate because: GI work-up for anemia and GI bleeding  Dispo: The patient is from: Home              Anticipated d/c is to: 2 to 3 days              Patient currently is not medically stable to d/c.  Difficult to place patient No     Infusions:   sodium chloride     pantoprazole      Scheduled Meds:  allopurinol  300 mg Oral q morning   amiodarone  200 mg Oral q morning   atorvastatin  40 mg Oral q morning   carvedilol  6.25 mg Oral BID WC   Chlorhexidine Gluconate Cloth  6 each Topical Q0600   hydrALAZINE  100 mg Oral  TID   insulin aspart  0-6 Units Subcutaneous Q4H   latanoprost  1 drop Both Eyes QHS   levothyroxine  150 mcg Oral QAC breakfast   linagliptin  5 mg Oral Daily   [START ON 05/27/2021] pantoprazole  40 mg Intravenous Q12H   sildenafil  20 mg Oral TID    PRN meds: acetaminophen **OR** acetaminophen, diphenhydrAMINE, fentaNYL (SUBLIMAZE) injection, ondansetron **OR** ondansetron (ZOFRAN) IV   Antimicrobials: Anti-infectives (From admission, onward)    None       Objective: Vitals:   05/24/21 1100 05/24/21 1200  BP: (!) 156/39 (!) 155/41  Pulse: 72 71  Resp: 17 16  Temp:    SpO2: 96% 94%    Intake/Output Summary (Last 24 hours) at 05/24/2021 1317 Last data filed at 05/24/2021 0514 Gross per 24 hour  Intake 365 ml  Output --  Net 365 ml   Filed Weights   05/24/21 0238  Weight: 86.2 kg   Weight change:  Body mass index is 32.61 kg/m.   Physical Exam: General exam: Pleasant, elderly African American female.  Not in distress Skin: No rashes, lesions or ulcers. HEENT: Atraumatic, normocephalic, no obvious bleeding Lungs: Clear to auscultation bilaterally CVS: Regular rate and rhythm, loud ejection systolic murmur present GI/Abd soft, nontender, nondistended, bowel sound present CNS: Alert, awake, oriented x3 Psychiatry: Mood appropriate Extremities: No pedal edema,  Data Review: I have personally reviewed the laboratory data and studies available.  F/u labs ordered. Unresulted Labs (From admission, onward)     Start     Ordered   05/24/21 1200  Hemoglobin and hematocrit, blood  Now then every 6 hours,   R (with TIMED occurrences)      05/24/21 0531   Signed and Held  Hepatitis B surface antigen  (New Admission [facility] (Hepatitis B))  Once,   R        Signed and Held   Signed and Held  Hepatitis B surface antibody  (New Admission [facility] (Hepatitis B))  Once,   R        Signed and Held   Signed and Held  Hepatitis B surface antibody,quantitative  (New  Admission [facility] Labs (Hepatitis B))  Once,   R        Signed and Held            Signed, Terrilee Croak, MD [facility] 05/24/2021

## 2021-05-24 NOTE — Anesthesia Procedure Notes (Signed)
Procedure Name: MAC Date/Time: 05/24/2021 2:30 PM Performed by: Lieutenant Diego, CRNA Pre-anesthesia Checklist: Patient identified, Emergency Drugs available, Suction available, Patient being monitored and Timeout performed Patient Re-evaluated:Patient Re-evaluated prior to induction Oxygen Delivery Method: Simple face mask Preoxygenation: Pre-oxygenation with 100% oxygen Induction Type: IV induction

## 2021-05-24 NOTE — ED Provider Notes (Addendum)
[facility] EMERGENCY DEPARTMENT Provider Note   CSN: [id number] Arrival date & time: 05/24/21  0175     History Chief Complaint  Patient presents with   Chest Pain    Patricia Mata is a 65 y.o. female.  Patient comes to the emergency department for evaluation of chest pain.  Patient reports she has not been feeling well for 24 hours.  Patient reports that the night before last she started having diarrhea.  She was up multiple times through the night.  By morning she was passing bright red blood followed by dark stools.  In the evening yesterday she started having vomiting of blood.  Initially it was bright red blood followed by coffee-ground emesis.  Patient reports that the bleeding stopped during the evening but when she went to lie down for bed last night she started to have left-sided chest pain.  She reports that the pain that she is experiencing is what she has normally felt when she has a low hemoglobin secondary to GI bleeding.      Past Medical History:  Diagnosis Date   Acute renal failure (ARF) ([place]) 06/08/2018   TTHS- [place]   Carotid artery disease [facility])    Chronic diastolic (congestive) heart failure (HCC)    Colon cancer ([place]) 2008   Diabetes mellitus ([place])    Dyspnea    ESRD on hemodialysis ([place])    Gout 09/08/2018   Heart attack ([place])    Heart murmur    Hypertension    Hypothyroidism    Macrocytic anemia    MDS (myelodysplastic syndrome) (HCC)    Moderate mitral stenosis    PAD (peripheral artery disease) (HCC)    PAF (paroxysmal atrial fibrillation) (HCC)    Pulmonary hypertension (HCC)    Renal artery stenosis (HCC)    a. >60% R renal artery stenosis by duplex 2021, managed medically.   Severe aortic stenosis    Transfusion-dependent anemia     Patient Active Problem List   Diagnosis Date Noted   Thrombocytopenia ([place]) 05/13/2021   Secondary hypercoagulable state ([place]) 05/07/2021   Atrial fibrillation with RVR  ([place]) 04/21/2021   Anemia 01/13/2021   Obesity 11/23/2020   Joint pain 11/23/2020   Other long term (current) drug therapy 11/23/2020   Primary osteoarthritis 11/23/2020   ESRD (end stage renal disease) ([place]) 11/22/2020   Nonrheumatic mitral (valve) stenosis 10/17/2020   Coagulation defect, unspecified ([place]) 05/08/8526   Complication of vascular dialysis catheter 10/14/2020   Peripheral vascular disease ([place]) 10/14/2020   NSTEMI (non-ST elevated myocardial infarction) ([place]) 10/10/2020   Angina pectoris ([place]) 10/09/2020   Angina at rest ([place]) 10/09/2020   Gastroesophageal reflux disease 09/12/2020   History of malignant neoplasm of colon 09/12/2020   Chronic gastritis 09/02/2020   Symptomatic anemia 08/31/2020   Abnormal uterine bleeding (AUB) 07/19/2020   Vaginal ulceration 07/19/2020   Acute renal failure superimposed on stage 4 chronic kidney disease ([place])    Palliative care by specialist    Acute on chronic diastolic CHF (congestive heart failure) ([place]) 06/07/2020   Chronic cough 05/04/2020   Allergic rhinitis 03/23/2020   Chronic kidney disease, stage 5 ([place]) 78/24/2353   Acute diastolic CHF (congestive heart failure) ([place]) 09/21/2019   HCAP (healthcare-associated pneumonia) 09/20/2019   Pulmonary hypertension ([place]) 09/20/2019   DM2 (diabetes mellitus, type 2) ([place]) 09/20/2019   Elevated troponin 09/20/2019   Severe sepsis ([place]) 09/20/2019   CAP (community acquired pneumonia) 09/14/2019   Epistaxis, recurrent 08/10/2019  Neutropenia ([place]) 05/25/2019   Malignant neoplasm of colon ([place]) 05/25/2019   Bradycardia 03/21/2019   Pneumonia due to COVID-19 virus 03/18/2019   Type 2 diabetes mellitus with hemoglobin A1c goal of less than 7.0% ([place]) 12/10/2018   Hypothyroidism 12/10/2018   Seasonal allergies 12/10/2018   Gout 09/08/2018   Bacterial infection due to Morganella morganii    Sacral wound 06/08/2018   Iron deficiency anemia due to chronic blood loss 06/08/2018    Hyponatremia 06/08/2018   Constipation 06/08/2018   Encounter for nasogastric (NG) tube placement    Pressure injury of skin 05/22/2018   Paroxysmal atrial fibrillation ([place]) 05/11/2018   Normocytic anemia 06/14/2017   Aortic stenosis, severe 06/10/2017   Carotid artery disease ([place]) 06/10/2017   Essential hypertension 05/13/2017   Hyperlipidemia 05/13/2017   Bilateral lower extremity edema 05/13/2017   Port-A-Cath in place 04/28/2017   MDS (myelodysplastic syndrome) ([place]) 03/20/2017   Goals of care, counseling/discussion 03/20/2017   Anemia in chronic kidney disease 05/10/2016   Anemia in stage 1 chronic kidney disease 05/10/2016   Anemia due to chronic kidney disease 02/09/2016    Past Surgical History:  Procedure Laterality Date   AV FISTULA PLACEMENT Right 09/26/2020   Procedure: RIGHT ARM ARTERIOVENOUS (AV) FISTULA CREATION;  Surgeon: Waynetta Sandy, MD;  Location: [place];  Service: Vascular;  Laterality: Right;  Monitor Anesthesia Care with Regional Block to Right Arm    [place] Right 12/13/2020   Procedure: RIGHT UPPER EXTREMITY ARTERIOVENOUS FISTULA TRANSPOSITION;  Surgeon: Waynetta Sandy, MD;  Location: [facility];  Service: Vascular;  Laterality: Right;   COLON SURGERY     COLONOSCOPY WITH PROPOFOL N/A 02/22/2021   Procedure: COLONOSCOPY WITH PROPOFOL;  Surgeon: Otis Brace, MD;  [facility];  Service: Gastroenterology;  Laterality: N/A;   DEBRIDMENT OF DECUBITUS ULCER N/A 06/12/2018   Procedure: DEBRIDMENT OF DECUBITUS ULCER;  Surgeon: Wallace Going, DO;  Location: [facility];  Service: Plastics;  Laterality: N/A;   ENTEROSCOPY N/A 02/22/2021   Procedure: ENTEROSCOPY;  Surgeon: Otis Brace, MD;  [facility];  Service: Gastroenterology;  Laterality: N/A;   ESOPHAGOGASTRODUODENOSCOPY N/A 09/01/2020   Procedure: ESOPHAGOGASTRODUODENOSCOPY (EGD);  Surgeon: Clarene Essex, MD;  [facility];  Service:  Endoscopy;  Laterality: N/A;   GIVENS CAPSULE STUDY N/A 09/01/2020   Procedure: GIVENS CAPSULE STUDY;  Surgeon: Clarene Essex, MD;  [facility];  Service: Endoscopy;  Laterality: N/A;   HEMOSTASIS CLIP PLACEMENT  02/22/2021   Procedure: HEMOSTASIS CLIP PLACEMENT;  Surgeon: Otis Brace, MD;  Location: [facility];  Service: Gastroenterology;;   HOT HEMOSTASIS N/A 02/22/2021   Procedure: HOT HEMOSTASIS (ARGON PLASMA COAGULATION/BICAP);  Surgeon: Otis Brace, MD;  Location: [facility];  Service: Gastroenterology;  Laterality: N/A;   IR FLUORO GUIDE CV LINE RIGHT  10/11/2020   IR FLUORO GUIDE PORT INSERTION RIGHT  04/02/2017   IR REMOVAL TUN ACCESS W/ PORT W/O FL MOD SED  03/16/2019   IR US GUIDE VASC ACCESS RIGHT  04/02/2017   IR US GUIDE VASC ACCESS RIGHT  10/11/2020   LIGATION OF COMPETING BRANCHES OF ARTERIOVENOUS FISTULA Right 12/13/2020   Procedure: LIGATION OF COMPETING BRANCHES OF RIGHT UPPER EXTREMITY ARTERIOVENOUS FISTULA;  Surgeon: Waynetta Sandy, MD;  Location: [facility];  Service: Vascular;  Laterality: Right;   POLYPECTOMY  02/22/2021   Procedure: POLYPECTOMY;  Surgeon: Otis Brace, MD;  [facility];  Service: Gastroenterology;;   RIGHT HEART CATH N/A 04/23/2019   Procedure: RIGHT HEART CATH;  Surgeon: Jolaine Artist, MD;  Location: [facility];  Service: Cardiovascular;  Laterality: N/A;   RIGHT/LEFT HEART CATH AND CORONARY ANGIOGRAPHY N/A 05/21/2018   Procedure: RIGHT/LEFT HEART CATH AND CORONARY ANGIOGRAPHY;  Surgeon: Nelva Bush, MD;  Location: [facility];  Service: Cardiovascular;  Laterality: N/A;   RIGHT/LEFT HEART CATH AND CORONARY ANGIOGRAPHY N/A 10/13/2020   Procedure: RIGHT/LEFT HEART CATH AND CORONARY ANGIOGRAPHY;  Surgeon: Jolaine Artist, MD;  Location: [facility];  Service: Cardiovascular;  Laterality: N/A;   TUBAL LIGATION       OB History   No obstetric history on file.     Family History   Problem Relation Age of Onset   Hypertension Mother    Diabetes Mother    Cervical cancer Mother    Heart attack Father    Hypertension Sister    Hypertension Brother    Hypertension Sister    Hypertension Sister    Prostate cancer Brother    HIV/AIDS Brother     Social History   Tobacco Use   Smoking status: Former    Packs/day: 0.25    Years: 20.00    Pack years: 5.00    Types: Cigarettes    Quit date: 1997    Years since quitting: 25.8   Smokeless tobacco: Former  Scientific laboratory technician Use: Never used  Substance Use Topics   Alcohol use: Yes    Alcohol/week: 1.0 standard drink    Types: 1 Standard drinks or equivalent per week    Comment: occ   Drug use: Never    Home Medications Prior to Admission medications   Medication Sig Start Date End Date Taking? Authorizing Provider  acetaminophen (TYLENOL) 500 MG tablet Take 1,000 mg by mouth every 6 (six) hours as needed (pain).    [provider]  allopurinol (ZYLOPRIM) 300 MG tablet Take 300 mg by mouth every morning. 07/05/20   [provider]  amiodarone (PACERONE) 200 MG tablet Take 1 tablet by mouth once daily Patient taking differently: Take 200 mg by mouth every morning. 05/07/21   Fenton, Clint R, PA  atorvastatin (LIPITOR) 40 MG tablet Take 1 tablet by mouth once daily Patient taking differently: Take 40 mg by mouth every morning. 11/13/20   [name], FNP  blood glucose meter kit and supplies Dispense based on patient and insurance preference. Use up to four times daily as directed. (FOR ICD-10 E10.9, E11.9). 11/04/18   [name], FNP  Blood Glucose Monitoring Suppl [facility] Karsten Fells) w/Device KIT Check blood sugars twice daily E11.9 11/30/19   [name], FNP  carvedilol (COREG) 6.25 MG tablet Take 1 tablet (6.25 mg total) by mouth 2 (two) times daily with a meal. Patient taking differently: Take 6.25 mg by mouth See admin instructions. Take one tablet (6.25 mg) by mouth in the morning  and at supper on Sunday,  Tuesday, Thursday, Saturday (non-dialysis days); on Monday, Wednesday and Friday (dialysis days) - take one tablet (6.25 mg) after dialysis as needed for SBP >140, then take one tablet (6.25 mg) with dinner (5pm) and at bedtime. 04/23/21   Thurnell Lose, MD  diphenhydrAMINE (BENADRYL) 25 MG tablet Take 25 mg by mouth every 8 (eight) hours as needed for itching.    [provider]  ethyl chloride spray Apply 1 application topically See admin instructions. Apply topically to dialysis site if there is no time to use emla cream 03/01/21   [provider]  glucose  blood test strip check blood sugars twice daily Dx code E11.22 03/02/20   [name], FNP  hydrALAZINE (APRESOLINE) 100 MG tablet Take 1 tablet (100 mg total) by mouth 3 (three) times daily. Patient taking differently: Take 100 mg by mouth See admin instructions. Take one tablet (100 mg) by mouth three times daily - morning, dinner and bedtime 09/27/20   Lorretta Harp, MD  Insulin Glargine [facility]) 100 UNIT/ML Inject 20 Units into the skin daily as needed (only if blood sugar over 150).    [provider]  Insulin Pen Needle 29G X 5MM MISC Use as directed 06/16/17   Bonnielee Haff, MD  Lancets (ONETOUCH DELICA PLUS [id number]) [place] check blood sugars twice daily Dx code: E11.22 03/02/20   [name], FNP  latanoprost (XALATAN) 0.005 % ophthalmic solution Place 1 drop into both eyes at bedtime.     [provider]  levothyroxine (SYNTHROID) 150 MCG tablet TAKE 1 TABLET BY MOUTH ONCE DAILY BEFORE BREAKFAST Patient taking differently: Take 150 mcg by mouth daily before breakfast. 04/16/21   [name], FNP  lidocaine-prilocaine (EMLA) cream Apply 1 application topically See admin instructions. Apply to dialysis site 30 minutes before arriving at dialysis on Tuesday, Thursday, Saturday 01/23/21   [provider]  linagliptin (TRADJENTA) 5 MG TABS tablet Take 1  tablet (5 mg total) by mouth daily. 04/05/21   [name], FNP  Methoxy PEG-Epoetin Beta (MIRCERA IJ) Mircera 05/03/21 05/02/22  [provider]  Multiple Vitamin (MULTIVITAMIN WITH MINERALS) TABS tablet Take 1 tablet by mouth every morning. Centrum Silver 50+    [provider]  omeprazole (PRILOSEC) 40 MG capsule Take 1 capsule (40 mg total) by mouth 2 (two) times daily with a meal. 05/15/21   Pokhrel, Laxman, MD  polyethylene glycol (MIRALAX / GLYCOLAX) 17 g packet Take 17 g by mouth daily as needed for moderate constipation. 01/14/21   Geradine Girt, DO  sildenafil (REVATIO) 20 MG tablet TAKE 1 TABLET BY MOUTH THREE TIMES DAILY Patient taking differently: Take 20 mg by mouth See admin instructions. Take one tablet (20 mg) by mouth three times daily - morning, supper and bedtime 02/05/21   Sherren Mocha, MD    Allergies    Lactose intolerance (gi), Morphine and related, and Sulfa antibiotics  Review of Systems   Review of Systems  Cardiovascular:  Positive for chest pain.  Gastrointestinal:  Positive for anal bleeding, diarrhea, nausea and vomiting. Negative for abdominal pain.  All other systems reviewed and are negative.  Physical Exam Updated Vital Signs BP (!) 154/32 (BP Location: Left Arm)   Pulse 71   Temp 97.6 F (36.4 C) (Oral)   Resp 14   Ht '5\' 4"'  (1.626 m)   Wt 86.2 kg   SpO2 98%   BMI 32.61 kg/m   Physical Exam Vitals and nursing note reviewed.  Constitutional:      General: She is not in acute distress.    Appearance: Normal appearance. She is well-developed.  HENT:     Head: Normocephalic and atraumatic.     Right Ear: Hearing normal.     Left Ear: Hearing normal.     Nose: Nose normal.  Eyes:     Conjunctiva/sclera: Conjunctivae normal.     Pupils: Pupils are equal, round, and reactive to light.  Cardiovascular:     Rate and Rhythm: Regular rhythm.     Heart sounds: S1 normal and S2 normal. Murmur (at RSB) heard.  No friction rub.  No gallop.  Pulmonary:     Effort: Pulmonary effort is normal. No respiratory distress.     Breath sounds: Normal breath sounds.  Chest:     Chest wall: No tenderness.  Abdominal:     General: Bowel sounds are normal.     Palpations: Abdomen is soft.     Tenderness: There is no abdominal tenderness. There is no guarding or rebound. Negative signs include Murphy's sign and McBurney's sign.     Hernia: No hernia is present.  Musculoskeletal:        General: Normal range of motion.     Cervical back: Normal range of motion and neck supple.  Skin:    General: Skin is warm and dry.     Findings: No rash.  Neurological:     Mental Status: She is alert and oriented to person, place, and time.     GCS: GCS eye subscore is 4. GCS verbal subscore is 5. GCS motor subscore is 6.     Cranial Nerves: No cranial nerve deficit.     Sensory: No sensory deficit.     Coordination: Coordination normal.  Psychiatric:        Speech: Speech normal.        Behavior: Behavior normal.        Thought Content: Thought content normal.    ED Results / Procedures / Treatments   Labs (all labs ordered are listed, but only abnormal results are displayed) Labs Reviewed  CBC WITH DIFFERENTIAL/PLATELET - Abnormal; Notable for the following components:      Result Value   RBC 1.82 (*)    Hemoglobin 5.5 (*)    HCT 17.5 (*)    RDW 20.8 (*)    nRBC 1.9 (*)    Lymphs Abs 0.6 (*)    All other components within normal limits  COMPREHENSIVE METABOLIC PANEL  PROTIME-INR  TYPE AND SCREEN  PREPARE RBC (CROSSMATCH)  TROPONIN I (HIGH SENSITIVITY)    EKG None  Radiology DG ABD ACUTE 2+V W 1V CHEST  Result Date: 05/24/2021 CLINICAL DATA:  Chest pain, vomiting EXAM: DG ABDOMEN ACUTE WITH 1 VIEW CHEST COMPARISON:  Chest radiographs dated 05/05/2021 FINDINGS: Lungs are clear.  No pleural effusion or pneumothorax. The heart is normal in size.  Thoracic aortic atherosclerosis. Nonobstructive bowel gas pattern. No  evidence of free air under the diaphragm on the upright view. Visualized osseous structures are within normal limits. IMPRESSION: Negative abdominal radiographs.  No acute cardiopulmonary disease. Electronically Signed   By: Julian Hy M.D.   On: 05/24/2021 03:34    Procedures Procedures   Medications Ordered in ED Medications  0.9 %  sodium chloride infusion (has no administration in time range)    ED Course  I have reviewed the triage vital signs and the nursing notes.  Pertinent labs & imaging results that were available during my care of the patient were reviewed by me and considered in my medical decision making (see chart for details).    MDM Rules/Calculators/A&P                           Patient presents to the emergency department for evaluation of chest pain.  Patient indicates that she has had this type of chest pain before when she has a low hemoglobin.  She has had recurrent GI bleeding.  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  Patient had recurrent bleeding multiple times through the day yesterday.  She has not hemodynamically unstable but her hemoglobin is low at 5.5.  She will require blood transfusion.  Patient will be admitted admitted for GI evaluation as well.  CRITICAL CARE Performed by: Orpah Greek   Total critical care time: 31 minutes  Critical care time was exclusive of separately billable procedures and treating other patients.  Critical care was necessary to treat or prevent imminent or life-threatening deterioration.  Critical care was time spent personally by me on the following activities: development of treatment plan with patient and/or surrogate as well as nursing, discussions with consultants, evaluation of patient's response to treatment, examination of patient, obtaining history from patient or surrogate, ordering and performing treatments and interventions, ordering and  review of laboratory studies, ordering and review of radiographic studies, pulse oximetry and re-evaluation of patient's condition.  Final Clinical Impression(s) / ED Diagnoses Final diagnoses:  Symptomatic anemia  Gastrointestinal hemorrhage, unspecified gastrointestinal hemorrhage type    Rx / DC Orders ED Discharge Orders     None        Ciel Yanes, Gwenyth Allegra, MD 05/24/21 [id number]    Orpah Greek, MD 05/24/21 [phone]

## 2021-05-25 ENCOUNTER — Encounter (HOSPITAL_COMMUNITY): Payer: Self-pay | Admitting: Gastroenterology

## 2021-05-25 ENCOUNTER — Other Ambulatory Visit: Payer: Medicare Other

## 2021-05-25 DIAGNOSIS — K922 Gastrointestinal hemorrhage, unspecified: Secondary | ICD-10-CM | POA: Diagnosis not present

## 2021-05-25 DIAGNOSIS — T182XXA Foreign body in stomach, initial encounter: Secondary | ICD-10-CM | POA: Diagnosis not present

## 2021-05-25 LAB — BPAM RBC
Blood Product Expiration Date: 202211292359
Blood Product Expiration Date: 202212022359
ISSUE DATE / TIME: 202211100450
ISSUE DATE / TIME: 202211101343
Unit Type and Rh: 600
Unit Type and Rh: 600

## 2021-05-25 LAB — TYPE AND SCREEN
ABO/RH(D): A NEG
Antibody Screen: NEGATIVE
Unit division: 0
Unit division: 0

## 2021-05-25 LAB — HEMOGLOBIN AND HEMATOCRIT, BLOOD
HCT: 23.4 % — ABNORMAL LOW (ref 36.0–46.0)
HCT: 24.4 % — ABNORMAL LOW (ref 36.0–46.0)
HCT: 27.9 % — ABNORMAL LOW (ref 36.0–46.0)
Hemoglobin: 8.1 g/dL — ABNORMAL LOW (ref 12.0–15.0)
Hemoglobin: 8.4 g/dL — ABNORMAL LOW (ref 12.0–15.0)
Hemoglobin: 9.2 g/dL — ABNORMAL LOW (ref 12.0–15.0)

## 2021-05-25 LAB — GLUCOSE, CAPILLARY
Glucose-Capillary: 79 mg/dL (ref 70–99)
Glucose-Capillary: 103 mg/dL — ABNORMAL HIGH (ref 70–99)
Glucose-Capillary: 150 mg/dL — ABNORMAL HIGH (ref 70–99)
Glucose-Capillary: 116 mg/dL — ABNORMAL HIGH (ref 70–99)

## 2021-05-25 LAB — HEPATITIS B SURFACE ANTIGEN: Hepatitis B Surface Ag: NONREACTIVE

## 2021-05-25 MED ORDER — PANTOPRAZOLE SODIUM 40 MG PO TBEC: 40.0000 mg | DELAYED_RELEASE_TABLET | Freq: Every day | ORAL | 6 refills | Status: AC

## 2021-05-25 MED ORDER — CHLORHEXIDINE GLUCONATE CLOTH 2 % EX PADS: 6.0000 | MEDICATED_PAD | Freq: Every day | CUTANEOUS | Status: DC

## 2021-05-25 MED ORDER — PANTOPRAZOLE SODIUM 40 MG PO TBEC: 40.0000 mg | DELAYED_RELEASE_TABLET | Freq: Every day | ORAL | Status: DC

## 2021-05-25 MED ORDER — INSULIN ASPART 100 UNIT/ML IJ SOLN
0.0000 [IU] | Freq: Three times a day (TID) | INTRAMUSCULAR | Status: DC
Start: 2021-05-25 — End: 2021-05-25

## 2021-05-25 NOTE — Anesthesia Postprocedure Evaluation (Signed)
Anesthesia Post Note  Patient: Patricia Mata  Procedure(s) Performed: ENTEROSCOPY HOT HEMOSTASIS (ARGON PLASMA COAGULATION/BICAP)     Patient location during evaluation: PACU Anesthesia Type: MAC Level of consciousness: awake and alert Pain management: pain level controlled Vital Signs Assessment: post-procedure vital signs reviewed and stable Respiratory status: spontaneous breathing, nonlabored ventilation, respiratory function stable and patient connected to nasal cannula oxygen Cardiovascular status: stable and blood pressure returned to baseline Postop Assessment: no apparent nausea or vomiting Anesthetic complications: no   No notable events documented.  Last Vitals:  Vitals:   05/25/21 0725 05/25/21 0848  BP: (!) 147/48 (!) 128/33  Pulse: 63   Resp: 17   Temp: (!) 36.3 C   SpO2: 100%     Last Pain:  Vitals:   05/25/21 0725  TempSrc: Axillary  PainSc:                  [name]

## 2021-05-25 NOTE — Progress Notes (Signed)
[facility] Progress Note   Subjective:   Seen in room, crocheting. S/p PRBC and EGD yesterday. Reports she feels great today. Denies SOB, CP, palpitations, dizziness, abdominal pain and nausea. No BM since yesterday.   Objective Vitals:   05/25/21 0228 05/25/21 0400 05/25/21 0725 05/25/21 0848  BP: (!) 159/34 (!) 149/42 (!) 147/48 (!) 128/33  Pulse: 67 62 63   Resp: 13 18 17    Temp: 98 F (36.7 C) 98.3 F (36.8 C) (!) 97.3 F (36.3 C)   TempSrc: Oral Oral Axillary   SpO2: 100% 100% 100%   Weight:      Height:       Physical Exam General: WDWN female, alert and in NAD. Very pleasant Heart:  RRR, 3/6 systolic murmur Lungs: CTA bilaterally Abdomen: Soft, non-tender, non-distended, +BS Extremities: No edema b/l lower extremities Dialysis Access:  LUE AVF +bruit  Additional Objective Labs: Basic Metabolic Panel: Recent Labs  Lab 05/24/21 0230 05/24/21 1414  NA 136 139  K 3.7 3.9  CL 97* 99  CO2 24  --   GLUCOSE 129* 86  BUN 59* 63*  CREATININE 5.65* 6.30*  CALCIUM 9.2  --    Liver Function Tests: Recent Labs  Lab 05/24/21 0230  AST 45*  ALT 28  ALKPHOS 91  BILITOT 0.9  PROT 5.9*  ALBUMIN 3.0*   No results for input(s): LIPASE, AMYLASE in the last 168 hours. CBC: Recent Labs  Lab 05/24/21 0230 05/24/21 0309 05/24/21 1414 05/24/21 1858 05/25/21 0611  WBC 6.4  --   --   --   --   NEUTROABS 4.7  --   --   --   --   HGB 5.5*   < > 6.5* 8.7* 8.4*  HCT 17.5*   < > 19.0* 26.1* 24.4*  MCV 96.2  --   --   --   --   PLT 170  --   --   --   --    < > = values in this interval not displayed.   Blood Culture    Component Value Date/Time   SDES  09/24/2019 1144    TRACHEAL ASPIRATE Performed at [facility], [place] [address], [place], [place] [address]    SPECREQUEST  09/24/2019 1144    NONE Performed at [facility], [place] [address], [place],  [address]    CULT FEW CANDIDA ALBICANS 09/24/2019  1144   REPTSTATUS 09/27/2019 FINAL 09/24/2019 1144    Cardiac Enzymes: No results for input(s): CKTOTAL, CKMB, CKMBINDEX, TROPONINI in the last 168 hours. CBG: Recent Labs  Lab 05/24/21 1703 05/24/21 2105 05/25/21 0225 05/25/21 0413 05/25/21 0857  GLUCAP 132* 103* 79 103* 150*   Iron Studies: No results for input(s): IRON, TIBC, TRANSFERRIN, FERRITIN in the last 72 hours. @lablastinr3 @ Studies/Results: DG ABD ACUTE 2+V W 1V CHEST  Result Date: 05/24/2021 CLINICAL DATA:  Chest pain, vomiting EXAM: DG ABDOMEN ACUTE WITH 1 VIEW CHEST COMPARISON:  Chest radiographs dated 05/05/2021 FINDINGS: Lungs are clear.  No pleural effusion or pneumothorax. The heart is normal in size.  Thoracic aortic atherosclerosis. Nonobstructive bowel gas pattern. No evidence of free air under the diaphragm on the upright view. Visualized osseous structures are within normal limits. IMPRESSION: Negative abdominal radiographs.  No acute cardiopulmonary disease. Electronically Signed   By: Julian Hy M.D.   On: 05/24/2021 03:34   Medications:  sodium chloride      allopurinol  300 mg Oral q morning  amiodarone  200 mg Oral q morning   atorvastatin  40 mg Oral q morning   carvedilol  6.25 mg Oral BID WC   Chlorhexidine Gluconate Cloth  6 each Topical Q0600   hydrALAZINE  100 mg Oral TID   insulin aspart  0-6 Units Subcutaneous Q4H   latanoprost  1 drop Both Eyes QHS   levothyroxine  150 mcg Oral QAC breakfast   linagliptin  5 mg Oral Daily   pantoprazole  40 mg Intravenous Daily   sildenafil  20 mg Oral TID    Dialysis Orders: [facility] T,Th,S 4 hrs 180NRe 400/Autoflow 1.5 87.2 kg 2.0K/2.0 Ca AVF -Heparin 2000 units IV TIW -Mircera 200 mcg IV q 2 weeks (last dose 05/03/2021) -Calcitriol 0.25 mcg PO TIW  Assessment/Plan: 1. UGIB/ABLA: HGB 5.5 with H/O AVMs. GI consulted and underwent endoscopy on 11/11. Hgb 8.4 this AM- downtrended a bit since last night.  2. Chest Pain in setting of low HGB:  Troponin flat. Probably demand ischemia. Per primary 3. ESRD -T,Th,S . Had dialysis overnight without issues. Next HD will be Sat, 11/12.  3. Anemia - As noted above. Last ESA 05/17/2021, on max dose. Transfuse as needed.  4. Secondary hyperparathyroidism - No on binders, Hold VDRA- C Ca 10.  5. HTN/volume - Last HD 05/22/2021. Close to EDW. No evidence of volume overload by CXR. UF as tolerated.  6. Nutrition - renal diet 7. H/O PAF-not on anticoagulation. Per primary 8. H/O PAH 9. DMT2-per primary 10. MDS-follows with oncology.   Anice Paganini, PA-C 05/25/2021, 9:36 AM  [facility] Pager: [phone]

## 2021-05-25 NOTE — Discharge Summary (Signed)
Physician Discharge Summary  Leonor Darnell OEH:[id number] DOB: 1956/03/29 DOA: 05/24/2021  PCP: [name], FNP  Admit date: 05/24/2021  Discharge date: 05/25/2021  Admitted From: Home.  Disposition:  Home.  Recommendations for Outpatient Follow-up:  Follow up with PCP in 1-2 weeks. Please obtain BMP/CBC in one week. Advised to follow-up with gastroenterology in 4 weeks. Advised to take pantoprazole 40 mg indefinitely. Advised to continue hemodialysis as per schedule.  Home Health: None Equipment/Devices:None  Discharge Condition: Stable CODE STATUS:Full code Diet recommendation: Heart Healthy   Brief [facility] course: This 65 y.o. female with PMH significant for DM2, HTN, ESRD on HD, chronic diastolic CHF, pulmonary artery hypertension, severe AS, PAF not on AC, carotid artery disease, myelodysplastic syndrome, with h/o recurrent GIBs recently secondary to AVMs. Patient was recently hospitalized from 10/30-11/1 with upper GI bleeding. He was treated medically and did not get scoped at that time.  Prior scope in August 2021 showed AVM with stigmata of bleeding in duodenum, got treated with APC and 2 clips placement.  She also had a scattered AVMs in the jejunum that required APC.  Colonoscopy at that time did not show any evidence of active bleeding. In last 24 hours prior to presentation, patient had diarrhea, bright red blood per rectum followed by melena followed by hematemesis and coffee-ground emesis.  She also started getting chest pain and hence came to the ED.   In the ED, she was hemodynamically stable.  Hemoglobin was noted to be low at 5.5. Troponin 61 and 64.  Patient was transfused 2 units of packed red blood cells. Patient was admitted for upper GI bleeding.  Hemoglobin on admission 5.5.  Patient received 2 units of packed red blood cells.  Chest pain was secondary to demand ischemia from anemia secondary to GI bleed.  GI was consulted.  Patient underwent  EGD.  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  Post transfusion hemoglobin 8.4.  Patient feels better.  H&H remained stable. Patient is cleared from GI to be discharged. Patient is being discharged home.   She was managed for below problems.  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   -Probably having recurrent GI bleeding due to AVMs, may be related to severe AS. -Last scope in August showed AVMs in duodenum and jejunum for which she received APC. -GI consulted.  Underwent EGD found to have AVM which was fixed. -GI recommended Protonix indefinitely.   Acute on chronic blood loss anemia -Hemoglobin at baseline low and runs between 8 and 9.  Presented with low hemoglobin of 5.5 -2 units of PRBC transfused.  Ferritin high over 2000 probably because of requirement of frequent transfusions. -Continue to monitor hemoglobin. -Hemoglobin remained stable posttransfusion..  Chest pain/elevated troponin Peripheral artery disease Carotid artery disease -Patient states every time her hemoglobin drops down to start having chest pain.  Probably related to demand ischemia.   -She has significant other vascular diseases including peripheral artery disease and carotid artery disease.  Unclear if she had ischemia evaluation in the past. -Continue statin.  Not on a blood thinner for recurrent GI bleeding   Severe aortic stenosis -May need consideration for TAVR which could help control AVM and GI bleeding as well -Defer to  cardiology. She sees Dr. Rayann Heman as an outpatient.   Chronic diastolic CHF Essential hypertension -Continue Coreg 6.25 mg twice daily, hydralazine 100 mg 3 times daily   Pulmonary hypertension -Continue sildenafil 20 mg 3 times daily   Paroxysmal A. Fib -Continue amiodarone 200 mg daily and Coreg 6.25 mg twice daily -Not on anticoagulation due to recurrent GI bleeding   ESRD HD TTS -Nephrology consulted.  Continue dialysis as per schedule.   Type 2 diabetes mellitus -A1c 6.5 on 10/30 but could be spuriously low because of chronic anemia and frequent PRBC transfusion -Home meds include Basaglar 20 units daily, linagliptin 5 mg daily -Currently on linagliptin and sliding scale insulin.  Continue to monitor   Hypothyroidism -Cont synthroid 150 mcg daily  Discharge Instructions  Discharge Instructions     Call MD for:  difficulty breathing, headache or visual disturbances   Complete by: As directed    Call MD for:  persistant dizziness or light-headedness   Complete by: As directed    Call MD for:  persistant nausea and vomiting   Complete by: As directed    Diet - low sodium heart healthy   Complete by: As directed    Diet Carb Modified   Complete by: As directed    Discharge instructions   Complete by: As directed    Advised to follow-up with primary care physician in 1 week. Advised to follow-up with gastroenterology in 4 weeks. Advised to take pantoprazole 40 mg indefinitely. Advised to continue her dialysis as per schedule.   Increase activity slowly   Complete by: As directed       Allergies as of 05/25/2021       Reactions   Lactose Intolerance (gi) Diarrhea   Morphine And Related Itching   Sulfa Antibiotics Rash, Other (See Comments)   Blisters, also        Medication List     STOP taking these medications    omeprazole 40 MG capsule Commonly known as: PRILOSEC       TAKE these medications    acetaminophen 500 MG tablet Commonly known as:  TYLENOL Take 1,000 mg by mouth every 6 (six) hours as needed (pain).   allopurinol 300 MG tablet Commonly known as: ZYLOPRIM Take 300 mg by mouth every morning.   amiodarone 200 MG tablet Commonly known as: PACERONE Take 1 tablet by mouth once daily What changed:  how much to take how to take this when to take this additional instructions   atorvastatin 40 MG tablet Commonly known as: LIPITOR Take 1 tablet by mouth once daily What changed: when to take this   Basaglar KwikPen 100 UNIT/ML Inject 20 Units into the skin daily as needed (only if blood sugar over 150).   blood glucose meter kit and supplies Dispense based on patient and insurance preference. Use up to four times daily as directed. (FOR ICD-10 E10.9, E11.9).   carvedilol 6.25 MG tablet Commonly known as: COREG Take 1 tablet (6.25 mg total) by mouth 2 (two) times daily with a meal.   diphenhydrAMINE 25 MG tablet Commonly known as: BENADRYL Take 25 mg by mouth every 8 (eight) hours as needed for itching.   ethyl chloride spray Apply 1 application topically See admin instructions. Apply topically to dialysis site if there is no time to  use emla cream   glucose blood test strip check blood sugars twice daily Dx code E11.22   hydrALAZINE 100 MG tablet Commonly known as: APRESOLINE Take 1 tablet (100 mg total) by mouth 3 (three) times daily. What changed:  when to take this additional instructions   Insulin Pen Needle 29G X 5MM Misc Use as directed   latanoprost 0.005 % ophthalmic solution Commonly known as: XALATAN Place 1 drop into both eyes at bedtime.   levothyroxine 150 MCG tablet Commonly known as: SYNTHROID TAKE 1 TABLET BY MOUTH ONCE DAILY BEFORE BREAKFAST   lidocaine-prilocaine cream Commonly known as: EMLA Apply 1 application topically See admin instructions. Apply to dialysis site 30 minutes before arriving at dialysis on Tuesday, Thursday, Saturday   linagliptin 5 MG Tabs  tablet Commonly known as: Tradjenta Take 1 tablet (5 mg total) by mouth daily.   MIRCERA IJ Mircera   multivitamin with minerals Tabs tablet Take 1 tablet by mouth every morning. Centrum Silver 19+   OneTouch Delica Plus [id number] Misc check blood sugars twice daily Dx code: E11.22   OneTouch UltraLink w/Device Kit Check blood sugars twice daily E11.9   pantoprazole 40 MG tablet Commonly known as: PROTONIX Take 1 tablet (40 mg total) by mouth daily. Start taking on: May 26, 2021   polyethylene glycol 17 g packet Commonly known as: MIRALAX / GLYCOLAX Take 17 g by mouth daily as needed for moderate constipation.   sildenafil 20 MG tablet Commonly known as: REVATIO TAKE 1 TABLET BY MOUTH THREE TIMES DAILY What changed:  when to take this additional instructions        Follow-up Information     [name], FNP Follow up in 1 week(s).   Specialty: General Practice Contact information: [address] [place] [address] [phone]         Lorretta Harp, MD .   Specialties: Cardiology, Radiology Contact information: [address] [place] [address] [phone]         Bensimhon, Shaune Pascal, MD .   Specialty: Cardiology Contact information: [address] [place] [address] [phone]         Wilford Corner, MD Follow up in 1 month(s).   Specialty: Gastroenterology Contact information: [address] [place] [address] [phone]                Allergies  Allergen Reactions   Lactose Intolerance (Gi) Diarrhea   Morphine And Related Itching   Sulfa Antibiotics Rash and Other (See Comments)    Blisters, also    Consultations: Gastroenterology   Procedures/Studies: DG Chest 2 View  Result Date: 05/08/2021 CLINICAL DATA:  Shortness of breath EXAM: CHEST - 2 VIEW COMPARISON:  Chest x-ray dated April 21, 2021 FINDINGS: Cardiac and mediastinal  contours are unchanged. Clear lungs. No evidence of pleural effusion or pneumothorax. IMPRESSION: No active cardiopulmonary disease. Electronically Signed   By: Yetta Glassman M.D.   On: 05/08/2021 13:05   DG ABD ACUTE 2+V W 1V CHEST  Result Date: 05/24/2021 CLINICAL DATA:  Chest pain, vomiting EXAM: DG ABDOMEN ACUTE WITH 1 VIEW CHEST COMPARISON:  Chest radiographs dated 05/05/2021 FINDINGS: Lungs are clear.  No pleural effusion or pneumothorax. The heart is normal in size.  Thoracic aortic atherosclerosis. Nonobstructive bowel gas pattern. No evidence of free air under the diaphragm on the upright view. Visualized osseous structures are within normal limits. IMPRESSION: Negative abdominal radiographs.  No acute cardiopulmonary  disease. Electronically Signed   By: Julian Hy M.D.   On: 05/24/2021 03:34    EGD    Subjective: Patient was seen and examined at bedside.  Overnight events noted.  Patient reports feeling much improved.   Patient underwent EGD found to have AVM which was fixed.  Patient want to be discharged.  Discharge Exam: Vitals:   05/25/21 0725 05/25/21 0848  BP: (!) 147/48 (!) 128/33  Pulse: 63   Resp: 17   Temp: (!) 97.3 F (36.3 C)   SpO2: 100%    Vitals:   05/25/21 0228 05/25/21 0400 05/25/21 0725 05/25/21 0848  BP: (!) 159/34 (!) 149/42 (!) 147/48 (!) 128/33  Pulse: 67 62 63   Resp: _0 Temp: 98 F (36.7 C) 98.3 F (36.8 C) (!) 97.3 F (36.3 C)   TempSrc: Oral Oral Axillary   SpO2: 100% 100% 100%   Weight:      Height:        General: Pt is alert, awake, not in acute distress Cardiovascular: RRR, S1/S2 +, no rubs, no gallops Respiratory: CTA bilaterally, no wheezing, no rhonchi Abdominal: Soft, NT, ND, bowel sounds + Extremities: no edema, no cyanosis    The results of significant diagnostics from this hospitalization (including imaging, microbiology, ancillary and laboratory) are listed below for reference.      Microbiology: Recent Results (from the past 240 hour(s))  Resp Panel by RT-PCR (Flu A&B, Covid) Nasopharyngeal Swab     Status: None   Collection Time: 05/24/21  3:44 AM   Specimen: Nasopharyngeal Swab; Nasopharyngeal(NP) swabs in vial transport medium  Result Value Ref Range Status   SARS Coronavirus 2 by RT PCR NEGATIVE NEGATIVE Final    Comment: (NOTE) SARS-CoV-2 target nucleic acids are NOT DETECTED.  The SARS-CoV-2 RNA is generally detectable in upper respiratory specimens during the acute phase of infection. The lowest concentration of SARS-CoV-2 viral copies this assay can detect is 138 copies/mL. A negative result does not preclude SARS-Cov-2 infection and should not be used as the sole basis for treatment or other patient management decisions. A negative result may occur with  improper specimen collection/handling, submission of specimen other than nasopharyngeal swab, presence of viral mutation(s) within the areas targeted by this assay, and inadequate number of viral copies(<138 copies/mL). A negative result must be combined with clinical observations, patient history, and epidemiological information. The expected result is Negative.  Fact Sheet for Patients:  EntrepreneurPulse.com.au  Fact Sheet for Healthcare Providers:  IncredibleEmployment.be  This test is no t yet approved or cleared by the [place] FDA and  has been authorized for detection and/or diagnosis of SARS-CoV-2 by FDA under an Emergency Use Authorization (EUA). This EUA will remain  in effect (meaning this test can be used) for the duration of the COVID-19 declaration under Section 564(b)(1) of the Act, 21 U.S.C.section 360bbb-3(b)(1), unless the authorization is terminated  or revoked sooner.       Influenza A by PCR NEGATIVE NEGATIVE Final   Influenza B by PCR NEGATIVE NEGATIVE Final    Comment: (NOTE) The Xpert Xpress SARS-CoV-2/FLU/RSV plus assay is  intended as an aid in the diagnosis of influenza from Nasopharyngeal swab specimens and should not be used as a sole basis for treatment. Nasal washings and aspirates are unacceptable for Xpert Xpress SARS-CoV-2/FLU/RSV testing.  Fact Sheet for Patients: EntrepreneurPulse.com.au  Fact Sheet for Healthcare Providers: IncredibleEmployment.be  This test is not yet approved or cleared by the [place] FDA  and has been authorized for detection and/or diagnosis of SARS-CoV-2 by FDA under an Emergency Use Authorization (EUA). This EUA will remain in effect (meaning this test can be used) for the duration of the COVID-19 declaration under Section 564(b)(1) of the Act, 21 U.S.C. section 360bbb-3(b)(1), unless the authorization is terminated or revoked.  Performed at [facility], [place] [address], [place], [place] [address]      Labs: BNP (last 3 results) Recent Labs    06/07/20 0826 07/06/20 1733 11/21/20 1102  BNP 489.2* 932.6* 4,580.9*   Basic Metabolic Panel: Recent Labs  Lab 05/24/21 0230 05/24/21 1414  NA 136 139  K 3.7 3.9  CL 97* 99  CO2 24  --   GLUCOSE 129* 86  BUN 59* 63*  CREATININE 5.65* 6.30*  CALCIUM 9.2  --    Liver Function Tests: Recent Labs  Lab 05/24/21 0230  AST 45*  ALT 28  ALKPHOS 91  BILITOT 0.9  PROT 5.9*  ALBUMIN 3.0*   No results for input(s): LIPASE, AMYLASE in the last 168 hours. No results for input(s): AMMONIA in the last 168 hours. CBC: Recent Labs  Lab 05/24/21 0230 05/24/21 0309 05/24/21 1414 05/24/21 1858 05/25/21 0611 05/25/21 1156  WBC 6.4  --   --   --   --   --   NEUTROABS 4.7  --   --   --   --   --   HGB 5.5* 8.1* 6.5* 8.7* 8.4* 9.2*  HCT 17.5* 23.4* 19.0* 26.1* 24.4* 27.9*  MCV 96.2  --   --   --   --   --   PLT 170  --   --   --   --   --    Cardiac Enzymes: No results for input(s): CKTOTAL, CKMB, CKMBINDEX, TROPONINI in the last 168 hours. BNP: Invalid  input(s): POCBNP CBG: Recent Labs  Lab 05/24/21 2105 05/25/21 0225 05/25/21 0413 05/25/21 0857 05/25/21 1158  GLUCAP 103* 79 103* 150* 116*   D-Dimer No results for input(s): DDIMER in the last 72 hours. Hgb A1c No results for input(s): HGBA1C in the last 72 hours. Lipid Profile No results for input(s): CHOL, HDL, LDLCALC, TRIG, CHOLHDL, LDLDIRECT in the last 72 hours. Thyroid function studies No results for input(s): TSH, T4TOTAL, T3FREE, THYROIDAB in the last 72 hours.  Invalid input(s): FREET3 Anemia work up No results for input(s): VITAMINB12, FOLATE, FERRITIN, TIBC, IRON, RETICCTPCT in the last 72 hours. Urinalysis    Component Value Date/Time   COLORURINE AMBER (A) 05/14/2021 0800   APPEARANCEUR HAZY (A) 05/14/2021 0800   LABSPEC 1.018 05/14/2021 0800   PHURINE 5.0 05/14/2021 0800   GLUCOSEU NEGATIVE 05/14/2021 0800   HGBUR NEGATIVE 05/14/2021 0800   BILIRUBINUR NEGATIVE 05/14/2021 0800   BILIRUBINUR negative 05/11/2020 1152   KETONESUR NEGATIVE 05/14/2021 0800   PROTEINUR 30 (A) 05/14/2021 0800   UROBILINOGEN 0.2 05/11/2020 1152   NITRITE NEGATIVE 05/14/2021 0800   LEUKOCYTESUR MODERATE (A) 05/14/2021 0800   Sepsis Labs Invalid input(s): PROCALCITONIN,  WBC,  LACTICIDVEN Microbiology Recent Results (from the past 240 hour(s))  Resp Panel by RT-PCR (Flu A&B, Covid) Nasopharyngeal Swab     Status: None   Collection Time: 05/24/21  3:44 AM   Specimen: Nasopharyngeal Swab; Nasopharyngeal(NP) swabs in vial transport medium  Result Value Ref Range Status   SARS Coronavirus 2 by RT PCR NEGATIVE NEGATIVE Final    Comment: (NOTE) SARS-CoV-2 target nucleic acids are NOT DETECTED.  The SARS-CoV-2 RNA  is generally detectable in upper respiratory specimens during the acute phase of infection. The lowest concentration of SARS-CoV-2 viral copies this assay can detect is 138 copies/mL. A negative result does not preclude SARS-Cov-2 infection and should not be used as  the sole basis for treatment or other patient management decisions. A negative result may occur with  improper specimen collection/handling, submission of specimen other than nasopharyngeal swab, presence of viral mutation(s) within the areas targeted by this assay, and inadequate number of viral copies(<138 copies/mL). A negative result must be combined with clinical observations, patient history, and epidemiological information. The expected result is Negative.  Fact Sheet for Patients:  EntrepreneurPulse.com.au  Fact Sheet for Healthcare Providers:  IncredibleEmployment.be  This test is no t yet approved or cleared by the [place] FDA and  has been authorized for detection and/or diagnosis of SARS-CoV-2 by FDA under an Emergency Use Authorization (EUA). This EUA will remain  in effect (meaning this test can be used) for the duration of the COVID-19 declaration under Section 564(b)(1) of the Act, 21 U.S.C.section 360bbb-3(b)(1), unless the authorization is terminated  or revoked sooner.       Influenza A by PCR NEGATIVE NEGATIVE Final   Influenza B by PCR NEGATIVE NEGATIVE Final    Comment: (NOTE) The Xpert Xpress SARS-CoV-2/FLU/RSV plus assay is intended as an aid in the diagnosis of influenza from Nasopharyngeal swab specimens and should not be used as a sole basis for treatment. Nasal washings and aspirates are unacceptable for Xpert Xpress SARS-CoV-2/FLU/RSV testing.  Fact Sheet for Patients: EntrepreneurPulse.com.au  Fact Sheet for Healthcare Providers: IncredibleEmployment.be  This test is not yet approved or cleared by the [place] FDA and has been authorized for detection and/or diagnosis of SARS-CoV-2 by FDA under an Emergency Use Authorization (EUA). This EUA will remain in effect (meaning this test can be used) for the duration of the COVID-19 declaration under Section 564(b)(1) of  the Act, 21 U.S.C. section 360bbb-3(b)(1), unless the authorization is terminated or revoked.  Performed at [facility], [place] [address] [place], [place] [address]      Time coordinating discharge: Over 30 minutes  SIGNED:   Shawna Clamp, MD  [facility] 05/25/2021, 4:18 PM Pager   If 7PM-7AM, please contact night-coverage

## 2021-05-25 NOTE — Progress Notes (Addendum)
Pt receives out-pt HD at [facility] on TTS prior to admission. Contacted pt's clinic and was informed that pt to switch to a MWF schedule on Monday. Pt will need to arrive at 5:35 for 5:55 chair time. If pt to d/c today or tomorrow am, will f/u with pt's clinic regarding time for treatment tomorrow. Contacted MD regarding possible d/c date and desire to avoid HD on day of d/c if possible. Will follow and assist.   Melven Sartorius Renal Navigator [phone]  Addendum at 12:15 pm: If pt d/c to home today, pt can receive HD at clinic tomorrow am. Pt will need to arrive at 7:00 for 7:20 chair time.   Addendum at 1:25 pm: Met with pt at bedside to make sure she is aware of out-pt schedule change starting on Monday. Pt aware that if d/c today she will need to go to clinic tomorrow am for treatment and to start new schedule on Monday. Pt states she is feeling well and ready to go home today. Pt requested that navigator contact MD regarding readiness for d/c which was done via secure chat.   Addendum at 1:44 pm: MD plans to d/c pt this afternoon as long as pt can ambulate. Contacted pt's out-pt clinic and spoke to [place], Agricultural consultant, to make her aware of pt's d/c today and to resume care tomorrow.

## 2021-05-25 NOTE — Progress Notes (Signed)
Discharge education and packet provided to patient at bedside, all questions and concerns addressed, pt waiting for husband to arrive and transport home. No concerns noted.

## 2021-05-25 NOTE — Progress Notes (Addendum)
[facility] Gastroenterology Progress Note  Patricia Mata 65 y.o. 20-Sep-1955  CC:  Symptomatic anemia  Subjective: Patient seen crocheting in room.  States she is ready to leave. Feels good.  Denies SOB, CP, dizziness, AB pain, nausea, vomiting.  Had formed, soft BM this AM, dark brown but denies melena or hematochezia.   ROS : Negative ROS   Objective: Vital signs in last 24 hours: Vitals:   05/25/21 0725 05/25/21 0848  BP: (!) 147/48 (!) 128/33  Pulse: 63   Resp: 17   Temp: (!) 97.3 F (36.3 C)   SpO2: 100%     Physical Exam: General:   Alert, in NAD Abdomen:  Soft, Obese AB,  Normal bowel sounds. Nontender. Without guarding and Without rebound, without hepatomegaly. Neurologic:  Alert and  oriented x4;  grossly normal neurologically. Psych:  Alert and cooperative. Normal mood and affect.  Lab Results: Recent Labs    05/24/21 0230 05/24/21 1414  NA 136 139  K 3.7 3.9  CL 97* 99  CO2 24  --   GLUCOSE 129* 86  BUN 59* 63*  CREATININE 5.65* 6.30*  CALCIUM 9.2  --    Recent Labs    05/24/21 0230  AST 45*  ALT 28  ALKPHOS 91  BILITOT 0.9  PROT 5.9*  ALBUMIN 3.0*   Recent Labs    05/24/21 0230 05/24/21 0309 05/24/21 1858 05/25/21 0611  WBC 6.4  --   --   --   NEUTROABS 4.7  --   --   --   HGB 5.5*   < > 8.7* 8.4*  HCT 17.5*   < > 26.1* 24.4*  MCV 96.2  --   --   --   PLT 170  --   --   --    < > = values in this interval not displayed.   Recent Labs    05/24/21 0344  LABPROT 14.7  INR 1.2    Lab Results: Results for orders placed or performed during the hospital encounter of 05/24/21 (from the past 48 hour(s))  CBC with Differential/Platelet     Status: Abnormal   Collection Time: 05/24/21  2:30 AM  Result Value Ref Range   WBC 6.4 4.0 - 10.5 K/uL   RBC 1.82 (L) 3.87 - 5.11 MIL/uL   Hemoglobin 5.5 (LL) 12.0 - 15.0 g/dL    Comment: REPEATED TO VERIFY THIS CRITICAL RESULT HAS VERIFIED AND BEEN CALLED TO BAILEY BENTON,RN BY ZELDA BEECH ON  11 10 2022 AT 0338, AND HAS BEEN READ BACK.     HCT 17.5 (L) 36.0 - 46.0 %   MCV 96.2 80.0 - 100.0 fL   MCH 30.2 26.0 - 34.0 pg   MCHC 31.4 30.0 - 36.0 g/dL   RDW 20.8 (H) 11.5 - 15.5 %   Platelets 170 150 - 400 K/uL   nRBC 1.9 (H) 0.0 - 0.2 %   Neutrophils Relative % 74 %   Neutro Abs 4.7 1.7 - 7.7 K/uL   Lymphocytes Relative 9 %   Lymphs Abs 0.6 (L) 0.7 - 4.0 K/uL   Monocytes Relative 16 %   Monocytes Absolute 1.0 0.1 - 1.0 K/uL   Eosinophils Relative 0 %   Eosinophils Absolute 0.0 0.0 - 0.5 K/uL   Basophils Relative 0 %   Basophils Absolute 0.0 0.0 - 0.1 K/uL   Immature Granulocytes 1 %   Abs Immature Granulocytes 0.03 0.00 - 0.07 K/uL    Comment: Performed at [facility]  Lab, [address] [place], [place] [address]  Troponin I (High Sensitivity)     Status: Abnormal   Collection Time: 05/24/21  2:30 AM  Result Value Ref Range   Troponin I (High Sensitivity) 61 (H) <18 ng/L    Comment: (NOTE) Elevated high sensitivity troponin I (hsTnI) values and significant  changes across serial measurements may suggest ACS but many other  chronic and acute conditions are known to elevate hsTnI results.  Refer to the "Links" section for chest pain algorithms and additional  guidance. Performed at [facility], [place] [address] [place], [place] [address]   Comprehensive metabolic panel     Status: Abnormal   Collection Time: 05/24/21  2:30 AM  Result Value Ref Range   Sodium 136 135 - 145 mmol/L   Potassium 3.7 3.5 - 5.1 mmol/L   Chloride 97 (L) 98 - 111 mmol/L   CO2 24 22 - 32 mmol/L   Glucose, Bld 129 (H) 70 - 99 mg/dL    Comment: Glucose reference range applies only to samples taken after fasting for at least 8 hours.   BUN 59 (H) 8 - 23 mg/dL   Creatinine, Ser 5.65 (H) 0.44 - 1.00 mg/dL   Calcium 9.2 8.9 - 10.3 mg/dL   Total Protein 5.9 (L) 6.5 - 8.1 g/dL   Albumin 3.0 (L) 3.5 - 5.0 g/dL   AST 45 (H) 15 - 41 U/L   ALT 28 0 - 44 U/L   Alkaline Phosphatase 91 38 -  126 U/L   Total Bilirubin 0.9 0.3 - 1.2 mg/dL   GFR, Estimated 8 (L) >60 mL/min    Comment: (NOTE) Calculated using the CKD-EPI Creatinine Equation (2021)    Anion gap 15 5 - 15    Comment: Performed at [facility], [place] [address], [place], [place] [address]  Type and screen     Status: None   Collection Time: 05/24/21  2:58 AM  Result Value Ref Range   ABO/RH(D) A NEG    Antibody Screen NEG    Sample Expiration 05/27/2021,2359    Unit Number [id number]    Blood Component Type RBC, LR IRR    Unit division 00    Status of Unit ISSUED,FINAL    Transfusion Status OK TO TRANSFUSE    Crossmatch Result Compatible    Unit Number [id number]    Blood Component Type RBC, LR IRR    Unit division 00    Status of Unit ISSUED,FINAL    Transfusion Status OK TO TRANSFUSE    Crossmatch Result      Compatible Performed at [facility], [place] [address] [place], [place] [address]   Hemoglobin and hematocrit, blood     Status: Abnormal   Collection Time: 05/24/21  3:09 AM  Result Value Ref Range   Hemoglobin 8.1 (L) 12.0 - 15.0 g/dL    Comment: REPEATED TO VERIFY POST TRANSFUSION SPECIMEN    HCT 23.4 (L) 36.0 - 46.0 %    Comment: Performed at [facility] [address], [place], [place] [address]  Hepatitis B surface antigen     Status: None   Collection Time: 05/24/21  3:10 AM  Result Value Ref Range   Hepatitis B Surface Ag NON REACTIVE NON REACTIVE    Comment: Performed at [facility] [address] [place], [place] [address]  Protime-INR     Status: None   Collection Time: 05/24/21  3:44 AM  Result Value Ref Range   Prothrombin Time 14.7 11.4 - 15.2 seconds   INR 1.2 0.8 - 1.2    Comment: (NOTE) INR goal varies based on device and disease states. Performed at [facility], [place] [address] [place], [place] [address]   Resp Panel by RT-PCR (Flu A&B, Covid) Nasopharyngeal Swab     Status: None   Collection Time: 05/24/21  3:44 AM    Specimen: Nasopharyngeal Swab; Nasopharyngeal(NP) swabs in vial transport medium  Result Value Ref Range   SARS Coronavirus 2 by RT PCR NEGATIVE NEGATIVE    Comment: (NOTE) SARS-CoV-2 target nucleic acids are NOT DETECTED.  The SARS-CoV-2 RNA is generally detectable in upper respiratory specimens during the acute phase of infection. The lowest concentration of SARS-CoV-2 viral copies this assay can detect is 138 copies/mL. A negative result does not preclude SARS-Cov-2 infection and should not be used as the sole basis for treatment or other patient management decisions. A negative result may occur with  improper specimen collection/handling, submission of specimen other than nasopharyngeal swab, presence of viral mutation(s) within the areas targeted by this assay, and inadequate number of viral copies(<138 copies/mL). A negative result must be combined with clinical observations, patient history, and epidemiological information. The expected result is Negative.  Fact Sheet for Patients:  EntrepreneurPulse.com.au  Fact Sheet for Healthcare Providers:  IncredibleEmployment.be  This test is no t yet approved or cleared by the [place] FDA and  has been authorized for detection and/or diagnosis of SARS-CoV-2 by FDA under an Emergency Use Authorization (EUA). This EUA will remain  in effect (meaning this test can be used) for the duration of the COVID-19 declaration under Section 564(b)(1) of the Act, 21 U.S.C.section 360bbb-3(b)(1), unless the authorization is terminated  or revoked sooner.       Influenza A by PCR NEGATIVE NEGATIVE   Influenza B by PCR NEGATIVE NEGATIVE    Comment: (NOTE) The Xpert Xpress SARS-CoV-2/FLU/RSV plus assay is intended as an aid in the diagnosis of influenza from Nasopharyngeal swab specimens and should not be used as a sole basis for treatment. Nasal washings and aspirates are unacceptable for Xpert Xpress  SARS-CoV-2/FLU/RSV testing.  Fact Sheet for Patients: EntrepreneurPulse.com.au  Fact Sheet for Healthcare Providers: IncredibleEmployment.be  This test is not yet approved or cleared by the [place] FDA and has been authorized for detection and/or diagnosis of SARS-CoV-2 by FDA under an Emergency Use Authorization (EUA). This EUA will remain in effect (meaning this test can be used) for the duration of the COVID-19 declaration under Section 564(b)(1) of the Act, 21 U.S.C. section 360bbb-3(b)(1), unless the authorization is terminated or revoked.  Performed at [facility], [place] [address] [place], [place] [address]   Prepare RBC (crossmatch)     Status: None   Collection Time: 05/24/21  4:00 AM  Result Value Ref Range   Order Confirmation      ORDER PROCESSED BY BLOOD BANK Performed at [facility], [place] [address], [place],  [address]   Troponin I (High Sensitivity)     Status: Abnormal   Collection Time: 05/24/21  4:29 AM  Result Value Ref Range   Troponin I (High Sensitivity) 64 (H) <18 ng/L    Comment: (NOTE) Elevated high sensitivity troponin I (hsTnI) values and significant  changes across serial measurements may suggest ACS but many other  chronic and acute conditions are known to elevate hsTnI results.  Refer to the "Links" section for chest  pain algorithms and additional  guidance. Performed at [facility], [place] [address], [place], [place] [address]   CBG monitoring, ED     Status: None   Collection Time: 05/24/21 11:13 AM  Result Value Ref Range   Glucose-Capillary 95 70 - 99 mg/dL    Comment: Glucose reference range applies only to samples taken after fasting for at least 8 hours.  I-STAT, chem 8     Status: Abnormal   Collection Time: 05/24/21  2:14 PM  Result Value Ref Range   Sodium 139 135 - 145 mmol/L   Potassium 3.9 3.5 - 5.1 mmol/L   Chloride 99 98 - 111 mmol/L   BUN 63 (H) 8 - 23  mg/dL   Creatinine, Ser 6.30 (H) 0.44 - 1.00 mg/dL   Glucose, Bld 86 70 - 99 mg/dL    Comment: Glucose reference range applies only to samples taken after fasting for at least 8 hours.   Calcium, Ion 1.15 1.15 - 1.40 mmol/L   TCO2 28 22 - 32 mmol/L   Hemoglobin 6.5 (LL) 12.0 - 15.0 g/dL   HCT 19.0 (L) 36.0 - 46.0 %   Comment NOTIFIED PHYSICIAN   Glucose, capillary     Status: Abnormal   Collection Time: 05/24/21  5:03 PM  Result Value Ref Range   Glucose-Capillary 132 (H) 70 - 99 mg/dL    Comment: Glucose reference range applies only to samples taken after fasting for at least 8 hours.  Hemoglobin and hematocrit, blood     Status: Abnormal   Collection Time: 05/24/21  6:58 PM  Result Value Ref Range   Hemoglobin 8.7 (L) 12.0 - 15.0 g/dL    Comment: REPEATED TO VERIFY POST TRANSFUSION SPECIMEN    HCT 26.1 (L) 36.0 - 46.0 %    Comment: Performed at [facility] [address], [place], [place] [address]  Glucose, capillary     Status: Abnormal   Collection Time: 05/24/21  9:05 PM  Result Value Ref Range   Glucose-Capillary 103 (H) 70 - 99 mg/dL    Comment: Glucose reference range applies only to samples taken after fasting for at least 8 hours.  Glucose, capillary     Status: None   Collection Time: 05/25/21  2:25 AM  Result Value Ref Range   Glucose-Capillary 79 70 - 99 mg/dL    Comment: Glucose reference range applies only to samples taken after fasting for at least 8 hours.  Glucose, capillary     Status: Abnormal   Collection Time: 05/25/21  4:13 AM  Result Value Ref Range   Glucose-Capillary 103 (H) 70 - 99 mg/dL    Comment: Glucose reference range applies only to samples taken after fasting for at least 8 hours.  Hemoglobin and hematocrit, blood     Status: Abnormal   Collection Time: 05/25/21  6:11 AM  Result Value Ref Range   Hemoglobin 8.4 (L) 12.0 - 15.0 g/dL   HCT 24.4 (L) 36.0 - 46.0 %    Comment: Performed at [facility], [place] [address],  [place], [place] [address]  Glucose, capillary     Status: Abnormal   Collection Time: 05/25/21  8:57 AM  Result Value Ref Range   Glucose-Capillary 150 (H) 70 - 99 mg/dL    Comment: Glucose reference range applies only to samples taken after fasting for at least 8 hours.   *Note: Due to a large number of results and/or encounters for the requested time  period, some results have not been displayed. A complete set of results can be found in Results Review.    Assessment/Plan: Symptomatic anemia with melena and history of recurrent upper GI bleed Normal colonoscopy 02/22/2021. 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 Baseline hemoglobin for patient appears to be around 9, s/p 2 units PRCS Enteroscopy 05/24/2021 with single non-bleeding gastric angiectasia s/p APC, small retained food/medication in AB.  HGB 8.4 (5.5) MCV 96.2  Iron 204 Ferritin 2,121 B12 1,155  Avoid ETOH, NSAIDS outpatient- discussed with patient. Continue protonix 40 mg once daily outpatient.  Continue supportive care with oncology outpatient, recheck CBC 2-4 weeks.   Chest pain resolved with PRBCs Paroxysmal atrial fibrillation (HCC)-Not on anticoagulation- follow up with cardiology outpatient.    ESRD (end stage renal disease) (HCC)-[facility] is following.  [facility] will sign off with outpatient follow up with Dr. Michail Sermon in 2-3 months.  Please contact us if we can be of any further assistance during this hospital stay.   Vladimir Crofts PA-C 05/25/2021, 11:45 AM  Contact #  [phone]

## 2021-05-25 NOTE — Discharge Instructions (Signed)
Advised to follow-up with primary care physician in 1 week. Advised to follow-up with gastroenterology in 4 weeks. Advised to take pantoprazole 40 mg indefinitely. Advised to continue her dialysis as per schedule.

## 2021-05-26 ENCOUNTER — Other Ambulatory Visit: Payer: Self-pay | Admitting: Emergency Medicine

## 2021-05-26 ENCOUNTER — Other Ambulatory Visit: Payer: Self-pay | Admitting: Nurse Practitioner

## 2021-05-26 DIAGNOSIS — E7800 Pure hypercholesterolemia, unspecified: Secondary | ICD-10-CM

## 2021-05-28 ENCOUNTER — Telehealth: Payer: Self-pay

## 2021-05-28 ENCOUNTER — Other Ambulatory Visit: Payer: Self-pay | Admitting: Nurse Practitioner

## 2021-05-28 DIAGNOSIS — E1122 Type 2 diabetes mellitus with diabetic chronic kidney disease: Secondary | ICD-10-CM

## 2021-05-28 DIAGNOSIS — N183 Chronic kidney disease, stage 3 unspecified: Secondary | ICD-10-CM

## 2021-05-28 NOTE — Telephone Encounter (Signed)
Rescheduled the patient for echo and same day visit with Dr. Burt Knack 07/31/2021. She was grateful for call and agrees with plan.

## 2021-05-28 NOTE — Telephone Encounter (Signed)
Attempted to call and reschedule echo and visit with Dr. Burt Knack. If the patient now has MWF dialysis, the first day both can be scheduled is 07/31/2021.  Left message to call back.

## 2021-05-28 NOTE — Telephone Encounter (Signed)
-----   Message from Frederic Jericho sent at 05/28/2021 12:06 PM EST ----- Regarding: Pt needs another day due to dialysis schedule change Hey There Valetta Fuller!  Pt's dialysis days have changed and she needs another day of the week for echo and [place] appt. Will you please call her to reschedule? Hope you are having a great Monday!

## 2021-05-30 ENCOUNTER — Inpatient Hospital Stay: Payer: Medicare Other

## 2021-05-30 NOTE — Patient Instructions (Addendum)
Visit Information It was great speaking with you today!  Please let me know if you have any questions about our visit.   Goals Addressed             This Visit's Progress    Manage My Medicine       Timeframe:  Long-Range Goal Priority:  High Start Date:  09/06/2020                                             Follow Up Date 08/02/2021   In Process:   - call for medicine refill 2 or 3 days before it runs out - keep a list of all the medicines I take; vitamins and herbals too - use a pillbox to sort medicine    Why is this important?   These steps will help you keep on track with your medicines.           Patient Care Plan: CCM Pharmacy Care Plan     Problem Identified: HTN, DM   Priority: High     Long-Range Goal: Disease Management   Recent Progress: On track  Priority: High  Note:   Current Barriers:  Unable to independently monitor therapeutic efficacy  Pharmacist Clinical Goal(s):  Patient will achieve adherence to monitoring guidelines and medication adherence to achieve therapeutic efficacy through collaboration with PharmD and provider.   Interventions: 1:1 collaboration with [name], FNP regarding development and update of comprehensive plan of care as evidenced by provider attestation and co-signature Inter-disciplinary care team collaboration (see longitudinal plan of care) Comprehensive medication review performed; medication list updated in electronic medical record  Hypertension (BP goal <130/80) -Controlled -Current treatment: Hydralazine 100 MG - taking 1 tablet by mouth three times per day Revatio 20 mg- taking 1 tablet by mouth three times daily  Carvedilol 6.25 mg tablet two time per day Amiodarone 200 mg tablet once daily  -Current home readings: she is checking her BP everyday, current readings: 119/49, 127/47,139/63, 137/64, 124/59  -Current dietary habits: she is not using a lot of salt -Current exercise habits: she is not  currently exercising.  -Denies hypotensive/hypertensive symptoms -Educated on Symptoms of hypotension and importance of maintaining adequate hydration; -Counseled to monitor BP at home at least 5 days per week, document, and provide log at future appointments -Recommended to continue current medication  Diabetes (A1c goal <8%) -Controlled -Current medications: Tradjenta 5 mg tablet once per day  Basaglar  She has had to use the insulin 6 times in October and 3 times this month.  Patient needs more  test strips  -Current home glucose readings fasting glucose: 203, 210, 108, 90, 107 -Denies hypoglycemic/hyperglycemic symptoms -Current meal patterns: her husband does most of the cooking  drinks: will discuss further during next visit  -Current exercise: will discuss further during next office visit  -Educated on Counseled to check feet daily and get yearly eye exams -Counseled to check feet daily and get yearly eye exams  -Patient is getting feet checked at Dialysis and the podiatrist.              -Last appointment with podiatrist was 04/06/2021 , Dr. Milly Jakob  -Recommended to continue current medication    Patient Goals/Self-Care Activities Patient will:  - take medications as prescribed as evidenced by patient report and record review  Follow Up Plan: The patient has been  provided with contact information for the care management team and has been advised to call with any health related questions or concerns.       Patient agreed to services and verbal consent obtained.   The patient verbalized understanding of instructions, educational materials, and care plan provided today and agreed to receive a mailed copy of patient instructions, educational materials, and care plan.   Orlando Penner, PharmD Clinical Pharmacist [facility] [phone]

## 2021-05-31 ENCOUNTER — Other Ambulatory Visit: Payer: Self-pay

## 2021-05-31 ENCOUNTER — Other Ambulatory Visit: Payer: Self-pay | Admitting: Emergency Medicine

## 2021-05-31 MED ORDER — LORATADINE 10 MG PO TABS: 10.0000 mg | ORAL_TABLET | Freq: Every day | ORAL | 2 refills | Status: DC | PRN

## 2021-05-31 MED ORDER — CARVEDILOL 6.25 MG PO TABS
6.2500 mg | ORAL_TABLET | Freq: Two times a day (BID) | ORAL | 0 refills | Status: DC
Start: 2021-05-31 — End: 2021-07-18

## 2021-06-03 ENCOUNTER — Other Ambulatory Visit: Payer: Self-pay | Admitting: Nurse Practitioner

## 2021-06-03 DIAGNOSIS — E039 Hypothyroidism, unspecified: Secondary | ICD-10-CM

## 2021-06-04 ENCOUNTER — Other Ambulatory Visit: Payer: Self-pay

## 2021-06-04 MED ORDER — LORATADINE 10 MG PO TABS: 10.0000 mg | ORAL_TABLET | Freq: Every day | ORAL | 2 refills | Status: AC | PRN

## 2021-06-05 ENCOUNTER — Inpatient Hospital Stay: Payer: Medicare Other

## 2021-06-05 ENCOUNTER — Other Ambulatory Visit: Payer: Self-pay | Admitting: *Deleted

## 2021-06-05 ENCOUNTER — Other Ambulatory Visit: Payer: Self-pay

## 2021-06-05 VITALS — BP 158/51 | HR 59 | Temp 98.9°F | Resp 16

## 2021-06-05 DIAGNOSIS — D469 Myelodysplastic syndrome, unspecified: Secondary | ICD-10-CM

## 2021-06-05 DIAGNOSIS — D649 Anemia, unspecified: Secondary | ICD-10-CM

## 2021-06-05 DIAGNOSIS — D500 Iron deficiency anemia secondary to blood loss (chronic): Secondary | ICD-10-CM

## 2021-06-05 DIAGNOSIS — D464 Refractory anemia, unspecified: Secondary | ICD-10-CM | POA: Diagnosis not present

## 2021-06-05 DIAGNOSIS — Z23 Encounter for immunization: Secondary | ICD-10-CM

## 2021-06-05 DIAGNOSIS — E039 Hypothyroidism, unspecified: Secondary | ICD-10-CM

## 2021-06-05 LAB — CBC WITH DIFFERENTIAL (CANCER CENTER ONLY)
Abs Immature Granulocytes: 0.02 10*3/uL (ref 0.00–0.07)
Basophils Absolute: 0 10*3/uL (ref 0.0–0.1)
Basophils Relative: 1 %
Eosinophils Absolute: 0 10*3/uL (ref 0.0–0.5)
Eosinophils Relative: 1 %
HCT: 25.7 % — ABNORMAL LOW (ref 36.0–46.0)
Hemoglobin: 8.3 g/dL — ABNORMAL LOW (ref 12.0–15.0)
Immature Granulocytes: 1 %
Lymphocytes Relative: 15 %
Lymphs Abs: 0.7 10*3/uL (ref 0.7–4.0)
MCH: 30.5 pg (ref 26.0–34.0)
MCHC: 32.3 g/dL (ref 30.0–36.0)
MCV: 94.5 fL (ref 80.0–100.0)
Monocytes Absolute: 0.7 10*3/uL (ref 0.1–1.0)
Monocytes Relative: 15 %
Neutro Abs: 3 10*3/uL (ref 1.7–7.7)
Neutrophils Relative %: 67 %
Platelet Count: 172 10*3/uL (ref 150–400)
RBC: 2.72 MIL/uL — ABNORMAL LOW (ref 3.87–5.11)
RDW: 22.2 % — ABNORMAL HIGH (ref 11.5–15.5)
WBC Count: 4.4 10*3/uL (ref 4.0–10.5)
nRBC: 0.5 % — ABNORMAL HIGH (ref 0.0–0.2)

## 2021-06-05 LAB — CMP (CANCER CENTER ONLY)
ALT: 20 U/L (ref 0–44)
AST: 41 U/L (ref 15–41)
Albumin: 3.3 g/dL — ABNORMAL LOW (ref 3.5–5.0)
Alkaline Phosphatase: 96 U/L (ref 38–126)
Anion gap: 10 (ref 5–15)
BUN: 33 mg/dL — ABNORMAL HIGH (ref 8–23)
CO2: 30 mmol/L (ref 22–32)
Calcium: 9.2 mg/dL (ref 8.9–10.3)
Chloride: 104 mmol/L (ref 98–111)
Creatinine: 3.91 mg/dL (ref 0.44–1.00)
GFR, Estimated: 12 mL/min — ABNORMAL LOW (ref >60)
Glucose, Bld: 107 mg/dL — ABNORMAL HIGH (ref 70–99)
Potassium: 3.5 mmol/L (ref 3.5–5.1)
Sodium: 144 mmol/L (ref 135–145)
Total Bilirubin: 1.3 mg/dL — ABNORMAL HIGH (ref 0.3–1.2)
Total Protein: 6.5 g/dL (ref 6.5–8.1)

## 2021-06-05 LAB — SAMPLE TO BLOOD BANK

## 2021-06-05 LAB — PREPARE RBC (CROSSMATCH)

## 2021-06-05 MED ORDER — INFLUENZA VAC A&B SA ADJ QUAD 0.5 ML IM PRSY
0.5000 mL | PREFILLED_SYRINGE | Freq: Once | INTRAMUSCULAR | Status: AC
  Administered 2021-06-05: 0.5 mL via INTRAMUSCULAR
  Filled 2021-06-05: qty 0.5

## 2021-06-05 MED ORDER — DIPHENHYDRAMINE HCL 25 MG PO CAPS
50.0000 mg | ORAL_CAPSULE | Freq: Once | ORAL | Status: AC
  Administered 2021-06-05: 50 mg via ORAL
  Filled 2021-06-05: qty 2

## 2021-06-05 MED ORDER — SODIUM CHLORIDE 0.9% IV SOLUTION
250.0000 mL | Freq: Once | INTRAVENOUS | Status: AC
  Administered 2021-06-05: 250 mL via INTRAVENOUS

## 2021-06-05 MED ORDER — LEVOTHYROXINE SODIUM 150 MCG PO TABS: 150.0000 ug | ORAL_TABLET | Freq: Every day | ORAL | 1 refills | Status: DC

## 2021-06-05 MED ORDER — ACETAMINOPHEN 325 MG PO TABS
650.0000 mg | ORAL_TABLET | Freq: Once | ORAL | Status: AC
  Administered 2021-06-05: 650 mg via ORAL
  Filled 2021-06-05: qty 2

## 2021-06-05 NOTE — Progress Notes (Signed)
Hemoglobin 8.3 per Dr. Alen Blew, pt. to receive 1 unit of packed red blood cells.

## 2021-06-05 NOTE — Patient Instructions (Signed)

## 2021-06-06 ENCOUNTER — Encounter (HOSPITAL_COMMUNITY): Payer: Medicare Other | Admitting: Internal Medicine

## 2021-06-06 ENCOUNTER — Telehealth: Payer: Self-pay

## 2021-06-06 LAB — TYPE AND SCREEN
ABO/RH(D): A NEG
Antibody Screen: NEGATIVE
Unit division: 0

## 2021-06-06 LAB — BPAM RBC
Blood Product Expiration Date: 202212202359
ISSUE DATE / TIME: 202211221345
Unit Type and Rh: 600

## 2021-06-06 NOTE — Telephone Encounter (Signed)
I called the  pt to let her know that samples of the Tradjenta has been placed up front for pickup.

## 2021-06-11 ENCOUNTER — Other Ambulatory Visit (HOSPITAL_COMMUNITY): Payer: Medicare Other

## 2021-06-11 ENCOUNTER — Ambulatory Visit: Payer: Medicare Other | Admitting: Cardiovascular Disease

## 2021-06-12 ENCOUNTER — Other Ambulatory Visit: Payer: Self-pay | Admitting: *Deleted

## 2021-06-12 ENCOUNTER — Other Ambulatory Visit: Payer: Self-pay

## 2021-06-12 ENCOUNTER — Ambulatory Visit: Payer: Medicare Other

## 2021-06-12 ENCOUNTER — Inpatient Hospital Stay: Payer: Medicare Other

## 2021-06-12 ENCOUNTER — Other Ambulatory Visit: Payer: Medicare Other

## 2021-06-12 ENCOUNTER — Other Ambulatory Visit: Payer: Self-pay | Admitting: Oncology

## 2021-06-12 DIAGNOSIS — D649 Anemia, unspecified: Secondary | ICD-10-CM

## 2021-06-12 DIAGNOSIS — D500 Iron deficiency anemia secondary to blood loss (chronic): Secondary | ICD-10-CM

## 2021-06-12 DIAGNOSIS — D464 Refractory anemia, unspecified: Secondary | ICD-10-CM | POA: Diagnosis not present

## 2021-06-12 DIAGNOSIS — D469 Myelodysplastic syndrome, unspecified: Secondary | ICD-10-CM

## 2021-06-12 LAB — SAMPLE TO BLOOD BANK

## 2021-06-12 LAB — CBC WITH DIFFERENTIAL (CANCER CENTER ONLY)
Abs Immature Granulocytes: 0.03 10*3/uL (ref 0.00–0.07)
Basophils Absolute: 0 10*3/uL (ref 0.0–0.1)
Basophils Relative: 0 %
Eosinophils Absolute: 0.1 10*3/uL (ref 0.0–0.5)
Eosinophils Relative: 1 %
HCT: 25.6 % — ABNORMAL LOW (ref 36.0–46.0)
Hemoglobin: 8.6 g/dL — ABNORMAL LOW (ref 12.0–15.0)
Immature Granulocytes: 1 %
Lymphocytes Relative: 18 %
Lymphs Abs: 0.7 10*3/uL (ref 0.7–4.0)
MCH: 30.9 pg (ref 26.0–34.0)
MCHC: 33.6 g/dL (ref 30.0–36.0)
MCV: 92.1 fL (ref 80.0–100.0)
Monocytes Absolute: 0.7 10*3/uL (ref 0.1–1.0)
Monocytes Relative: 19 %
Neutro Abs: 2.4 10*3/uL (ref 1.7–7.7)
Neutrophils Relative %: 61 %
Platelet Count: 162 10*3/uL (ref 150–400)
RBC: 2.78 MIL/uL — ABNORMAL LOW (ref 3.87–5.11)
RDW: 20.3 % — ABNORMAL HIGH (ref 11.5–15.5)
WBC Count: 3.9 10*3/uL — ABNORMAL LOW (ref 4.0–10.5)
nRBC: 0 % (ref 0.0–0.2)

## 2021-06-12 LAB — PREPARE RBC (CROSSMATCH)

## 2021-06-12 MED ORDER — DIPHENHYDRAMINE HCL 25 MG PO CAPS
50.0000 mg | ORAL_CAPSULE | Freq: Once | ORAL | Status: AC
  Administered 2021-06-12: 50 mg via ORAL

## 2021-06-12 MED ORDER — SODIUM CHLORIDE 0.9% IV SOLUTION
250.0000 mL | Freq: Once | INTRAVENOUS | Status: AC
  Administered 2021-06-12: 250 mL via INTRAVENOUS

## 2021-06-12 MED ORDER — ACETAMINOPHEN 325 MG PO TABS
650.0000 mg | ORAL_TABLET | Freq: Once | ORAL | Status: AC
  Administered 2021-06-12: 650 mg via ORAL

## 2021-06-12 NOTE — Patient Instructions (Signed)
Blood Transfusion, Adult, Care After This sheet gives you information about how to care for yourself after your procedure. Your doctor may also give you more specific instructions. If you have problems or questions, contact your doctor. What can I expect after the procedure? After the procedure, it is common to have: Bruising and soreness at the IV site. A headache. Follow these instructions at home: Insertion site care   Follow instructions from your doctor about how to take care of your insertion site. This is where an IV tube was put into your vein. Make sure you: Wash your hands with soap and water before and after you change your bandage (dressing). If you cannot use soap and water, use hand sanitizer. Change your bandage as told by your doctor. Check your insertion site every day for signs of infection. Check for: Redness, swelling, or pain. Bleeding from the site. Warmth. Pus or a bad smell. General instructions Take over-the-counter and prescription medicines only as told by your doctor. Rest as told by your doctor. Go back to your normal activities as told by your doctor. Keep all follow-up visits as told by your doctor. This is important. Contact a doctor if: You have itching or red, swollen areas of skin (hives). You feel worried or nervous (anxious). You feel weak after doing your normal activities. You have redness, swelling, warmth, or pain around the insertion site. You have blood coming from the insertion site, and the blood does not stop with pressure. You have pus or a bad smell coming from the insertion site. Get help right away if: You have signs of a serious reaction. This may be coming from an allergy or the body's defense system (immune system). Signs include: Trouble breathing or shortness of breath. Swelling of the face or feeling warm (flushed). Fever or chills. Head, chest, or back pain. Dark pee (urine) or blood in the pee. Widespread rash. Fast  heartbeat. Feeling dizzy or light-headed. You may receive your blood transfusion in an outpatient setting. If so, you will be told whom to contact to report any reactions. These symptoms may be an emergency. Do not wait to see if the symptoms will go away. Get medical help right away. Call your local emergency services (911 in the [place] Do not drive yourself to the hospital. Summary Bruising and soreness at the IV site are common. Check your insertion site every day for signs of infection. Rest as told by your doctor. Go back to your normal activities as told by your doctor. Get help right away if you have signs of a serious reaction. This information is not intended to replace advice given to you by your health care provider. Make sure you discuss any questions you have with your health care provider. Document Revised: 10/26/2020 Document Reviewed: 12/24/2018 Elsevier Patient Education  2022 [employer] Inc.  

## 2021-06-13 ENCOUNTER — Other Ambulatory Visit: Payer: Medicare Other

## 2021-06-13 DIAGNOSIS — I10 Essential (primary) hypertension: Secondary | ICD-10-CM | POA: Diagnosis not present

## 2021-06-13 DIAGNOSIS — Z794 Long term (current) use of insulin: Secondary | ICD-10-CM | POA: Diagnosis not present

## 2021-06-13 DIAGNOSIS — N184 Chronic kidney disease, stage 4 (severe): Secondary | ICD-10-CM

## 2021-06-13 DIAGNOSIS — E1122 Type 2 diabetes mellitus with diabetic chronic kidney disease: Secondary | ICD-10-CM

## 2021-06-13 LAB — TYPE AND SCREEN
ABO/RH(D): A NEG
Antibody Screen: NEGATIVE
Unit division: 0

## 2021-06-13 LAB — BPAM RBC
Blood Product Expiration Date: 202212272359
ISSUE DATE / TIME: 202211291126
Unit Type and Rh: 600

## 2021-06-14 ENCOUNTER — Ambulatory Visit (INDEPENDENT_AMBULATORY_CARE_PROVIDER_SITE_OTHER): Payer: Medicare Other | Admitting: Nurse Practitioner

## 2021-06-14 ENCOUNTER — Encounter: Payer: Self-pay | Admitting: Nurse Practitioner

## 2021-06-14 ENCOUNTER — Other Ambulatory Visit: Payer: Self-pay

## 2021-06-14 ENCOUNTER — Other Ambulatory Visit: Payer: Medicare Other

## 2021-06-14 VITALS — BP 128/66 | HR 78 | Temp 98.4°F | Ht 64.0 in | Wt 192.2 lb

## 2021-06-14 DIAGNOSIS — E1122 Type 2 diabetes mellitus with diabetic chronic kidney disease: Secondary | ICD-10-CM | POA: Diagnosis not present

## 2021-06-14 DIAGNOSIS — Z8719 Personal history of other diseases of the digestive system: Secondary | ICD-10-CM | POA: Diagnosis not present

## 2021-06-14 DIAGNOSIS — E039 Hypothyroidism, unspecified: Secondary | ICD-10-CM | POA: Diagnosis not present

## 2021-06-14 DIAGNOSIS — N186 End stage renal disease: Secondary | ICD-10-CM

## 2021-06-14 DIAGNOSIS — Z09 Encounter for follow-up examination after completed treatment for conditions other than malignant neoplasm: Secondary | ICD-10-CM

## 2021-06-14 DIAGNOSIS — Z794 Long term (current) use of insulin: Secondary | ICD-10-CM

## 2021-06-14 DIAGNOSIS — Z992 Dependence on renal dialysis: Secondary | ICD-10-CM

## 2021-06-14 MED ORDER — SITAGLIPTIN PHOSPHATE 25 MG PO TABS: 25.0000 mg | ORAL_TABLET | Freq: Every day | ORAL | 2 refills | Status: DC

## 2021-06-14 NOTE — Progress Notes (Signed)
I,Katawbba Wiggins,acting as a Education administrator for [employer], FNP.,have documented all relevant documentation on the behalf of [name], FNP,as directed by  [name], FNP while in the presence of [name], [place].  This visit occurred during the SARS-CoV-2 public health emergency.  Safety protocols were in place, including screening questions prior to the visit, additional usage of staff PPE, and extensive cleaning of exam room while observing appropriate contact time as indicated for disinfecting solutions.  Subjective:     Patient ID: Patricia Mata , female    DOB: 1956/07/12 , 65 y.o.   MRN: [id number]   Chief Complaint  Patient presents with   hospital f/u    HPI  The patient is here today for a hospital f/u.  According to hospital notes - 05/24/21 admssion she was admitted for upper GI bleeding.  Hemoglobin on admission 5.5.  Patient received 2 units of packed red blood cells.  Chest pain was secondary to demand ischemia from anemia secondary to GI bleed.  GI was consulted.  Patient underwent EGD.  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  Post transfusion hemoglobin 8.4 after feeling better.   She has had 1 Unit PRBC - if Hgb less than 9 she gets one pint, less than 7 she gets 2 units PRBC. She is having her CBC checked weekly. This is her new regimen. She is doing well today.   Her insurance company reports her alternate to tradjenta is [place].     Past Medical History:  Diagnosis Date   Acute renal failure (ARF) ([place]) 06/08/2018   TTHS- [place]   Carotid artery disease [facility])    Chronic diastolic (congestive) heart failure (HCC)    Colon cancer ([place]) 2008   Diabetes mellitus ([place])    Dyspnea    ESRD on hemodialysis ([place])    Gout 09/08/2018   Heart attack ([place])    Heart murmur    Hypertension    Hypothyroidism    Macrocytic anemia    MDS (myelodysplastic syndrome) (HCC)    Moderate mitral stenosis    PAD  (peripheral artery disease) (HCC)    PAF (paroxysmal atrial fibrillation) (HCC)    Pulmonary hypertension (HCC)    Renal artery stenosis (HCC)    a. >60% R renal artery stenosis by duplex 2021, managed medically.   Severe aortic stenosis    Transfusion-dependent anemia      Family History  Problem Relation Age of Onset   Hypertension Mother    Diabetes Mother    Cervical cancer Mother    Heart attack Father    Hypertension Sister    Hypertension Brother    Hypertension Sister    Hypertension Sister    Prostate cancer Brother    HIV/AIDS Brother      Current Outpatient Medications:    sitaGLIPtin (JANUVIA) 25 MG tablet, Take 1 tablet (25 mg total) by mouth daily., Disp: 30 tablet, Rfl: 2   acetaminophen (TYLENOL) 500 MG tablet, Take 1,000 mg by mouth every 6 (six) hours as needed (pain)., Disp: , Rfl:    allopurinol (ZYLOPRIM) 300 MG tablet, Take 300 mg by mouth every morning., Disp: , Rfl:    amiodarone (PACERONE) 200 MG tablet, Take 1 tablet by mouth once daily (Patient taking differently: Take 200 mg by mouth every morning.), Disp: 90 tablet, Rfl: 1   atorvastatin (LIPITOR) 40 MG tablet, Take 1 tablet by mouth once daily, Disp: 90 tablet, Rfl: 0   blood  glucose meter kit and supplies, Dispense based on patient and insurance preference. Use up to four times daily as directed. (FOR ICD-10 E10.9, E11.9)., Disp: 1 each, Rfl: 3   Blood Glucose Monitoring Suppl (ONETOUCH ULTRALINK) w/Device KIT, Check blood sugars twice daily E11.9, Disp: 1 kit, Rfl: 0   carvedilol (COREG) 6.25 MG tablet, Take 1 tablet (6.25 mg total) by mouth 2 (two) times daily with a meal., Disp: 60 tablet, Rfl: 0   diphenhydrAMINE (BENADRYL) 25 MG tablet, Take 25 mg by mouth every 8 (eight) hours as needed for itching., Disp: , Rfl:    ethyl chloride spray, Apply 1 application topically See admin instructions. Apply topically to dialysis site if there is no time to use emla cream, Disp: , Rfl:    hydrALAZINE  (APRESOLINE) 100 MG tablet, Take 1 tablet (100 mg total) by mouth 3 (three) times daily. (Patient taking differently: Take 100 mg by mouth See admin instructions. Take one tablet (100 mg) by mouth three times daily - morning, dinner and bedtime), Disp: 90 tablet, Rfl: 11   Insulin Glargine (BASAGLAR KWIKPEN) 100 UNIT/ML, Inject 20 Units into the skin daily as needed (only if blood sugar over 150)., Disp: , Rfl:    Insulin Pen Needle 29G X 5MM MISC, Use as directed, Disp: 200 each, Rfl: 0   Lancets (ONETOUCH DELICA PLUS [id number]) MISC, check blood sugars twice daily Dx code: E11.22, Disp: 100 each, Rfl: 3   latanoprost (XALATAN) 0.005 % ophthalmic solution, Place 1 drop into both eyes at bedtime. , Disp: , Rfl:    levothyroxine (SYNTHROID) 175 MCG tablet, Take 1 tablet (175 mcg total) by mouth daily before breakfast., Disp: 30 tablet, Rfl: 2   lidocaine-prilocaine (EMLA) cream, Apply 1 application topically See admin instructions. Apply to dialysis site 30 minutes before arriving at dialysis on Tuesday, Thursday, Saturday, Disp: , Rfl:    loratadine (CLARITIN) 10 MG tablet, Take 1 tablet (10 mg total) by mouth daily as needed for allergies., Disp: 90 tablet, Rfl: 2   Methoxy PEG-Epoetin Beta (MIRCERA IJ), Mircera, Disp: , Rfl:    Multiple Vitamin (MULTIVITAMIN WITH MINERALS) TABS tablet, Take 1 tablet by mouth every morning. Centrum Silver 50+, Disp: , Rfl:    ONETOUCH ULTRA test strip, CHECK BLOOD SUGARS TWICE DAILY, Disp: 100 each, Rfl: 11   pantoprazole (PROTONIX) 40 MG tablet, Take 1 tablet (40 mg total) by mouth daily., Disp: 90 tablet, Rfl: 6   polyethylene glycol (MIRALAX / GLYCOLAX) 17 g packet, Take 17 g by mouth daily as needed for moderate constipation., Disp: , Rfl:    sildenafil (REVATIO) 20 MG tablet, TAKE 1 TABLET BY MOUTH THREE TIMES DAILY (Patient taking differently: Take 20 mg by mouth See admin instructions. Take one tablet (20 mg) by mouth three times daily - morning, supper and  bedtime), Disp: 90 tablet, Rfl: 4   Allergies  Allergen Reactions   Lactose Intolerance (Gi) Diarrhea   Morphine And Related Itching   Sulfa Antibiotics Rash and Other (See Comments)    Blisters, also     Review of Systems  Constitutional: Negative.   Respiratory: Negative.    Cardiovascular: Negative.   Gastrointestinal: Negative.   Neurological:  Negative for dizziness and headaches.  Psychiatric/Behavioral: Negative.    All other systems reviewed and are negative.   Today's Vitals   06/14/21 1622  BP: 128/66  Pulse: 78  Temp: 98.4 F (36.9 C)  Weight: 192 lb 3.2 oz (87.2 kg)  Height: '5\' 4"'  (1.626  m)   Body mass index is 32.99 kg/m.  Wt Readings from Last 3 Encounters:  06/14/21 192 lb 3.2 oz (87.2 kg)  05/24/21 191 lb 12.8 oz (87 kg)  05/16/21 191 lb (86.6 kg)    BP Readings from Last 3 Encounters:  06/14/21 128/66  06/12/21 (!) 180/54  06/05/21 (!) 158/51    Objective:  Physical Exam Constitutional:      General: She is not in acute distress.    Appearance: Normal appearance. She is obese.  Cardiovascular:     Pulses: Normal pulses.     Heart sounds: Normal heart sounds. No murmur heard. Pulmonary:     Effort: Pulmonary effort is normal. No respiratory distress.     Breath sounds: Normal breath sounds. No wheezing.  Neurological:     General: No focal deficit present.     Mental Status: She is alert and oriented to person, place, and time.     Cranial Nerves: No cranial nerve deficit.     Motor: No weakness.  Psychiatric:        Mood and Affect: Mood normal.        Behavior: Behavior normal.        Thought Content: Thought content normal.        Judgment: Judgment normal.        Assessment And Plan:     1. Type 2 diabetes mellitus with chronic kidney disease on chronic dialysis, with long-term current use of insulin (HCC) Comments: will switch to Januvia since insurance not covering Tradjenta - Hemoglobin A1c - Magnesium - sitaGLIPtin  (JANUVIA) 25 MG tablet; Take 1 tablet (25 mg total) by mouth daily.  Dispense: 30 tablet; Refill: 2  2. Acquired hypothyroidism Comments: Will receck thyroid levels and will make changes pending results - TSH - T4 - T3, free  3. History of upper gastrointestinal bleeding Comments: She is to see Dr. Michail Sermon, she was admitted for UGIB, given PRBCs and has been stable since admission with new regimen.  4. Hospital discharge follow-up Comments: No TCM done    Patient was given opportunity to ask questions. Patient verbalized understanding of the plan and was able to repeat key elements of the plan. All questions were answered to their satisfaction.  [name], FNP   I, [name], FNP, have reviewed all documentation for this visit. The documentation on 06/14/21 for the exam, diagnosis, procedures, and orders are all accurate and complete.   IF YOU HAVE BEEN REFERRED TO A SPECIALIST, IT MAY TAKE 1-2 WEEKS TO SCHEDULE/PROCESS THE REFERRAL. IF YOU HAVE NOT HEARD FROM US/SPECIALIST IN TWO WEEKS, PLEASE GIVE [place] A CALL AT [phone].   THE PATIENT IS ENCOURAGED TO PRACTICE SOCIAL DISTANCING DUE TO THE COVID-19 PANDEMIC.

## 2021-06-15 LAB — T4: T4, Total: 12.3 ug/dL — ABNORMAL HIGH (ref 4.5–12.0)

## 2021-06-15 LAB — T3, FREE: T3, Free: 1.8 pg/mL — ABNORMAL LOW (ref 2.0–4.4)

## 2021-06-15 LAB — TSH: TSH: 1.99 u[IU]/mL (ref 0.450–4.500)

## 2021-06-15 LAB — HEMOGLOBIN A1C
Est. average glucose Bld gHb Est-mCnc: 123 mg/dL
Hgb A1c MFr Bld: 5.9 % — ABNORMAL HIGH (ref 4.8–5.6)

## 2021-06-15 LAB — MAGNESIUM: Magnesium: 2 mg/dL (ref 1.6–2.3)

## 2021-06-17 ENCOUNTER — Other Ambulatory Visit: Payer: Self-pay | Admitting: Nurse Practitioner

## 2021-06-17 DIAGNOSIS — E039 Hypothyroidism, unspecified: Secondary | ICD-10-CM

## 2021-06-17 MED ORDER — LEVOTHYROXINE SODIUM 175 MCG PO TABS
175.0000 ug | ORAL_TABLET | Freq: Every day | ORAL | 2 refills | Status: DC
Start: 2021-06-17 — End: 2021-06-18

## 2021-06-18 ENCOUNTER — Other Ambulatory Visit: Payer: Self-pay

## 2021-06-18 DIAGNOSIS — E039 Hypothyroidism, unspecified: Secondary | ICD-10-CM

## 2021-06-18 MED ORDER — LEVOTHYROXINE SODIUM 175 MCG PO TABS: 175.0000 ug | ORAL_TABLET | Freq: Every day | ORAL | 2 refills | Status: AC

## 2021-06-19 ENCOUNTER — Inpatient Hospital Stay (HOSPITAL_BASED_OUTPATIENT_CLINIC_OR_DEPARTMENT_OTHER): Payer: Medicare Other

## 2021-06-19 ENCOUNTER — Other Ambulatory Visit: Payer: Self-pay | Admitting: *Deleted

## 2021-06-19 ENCOUNTER — Inpatient Hospital Stay: Payer: Medicare Other | Attending: Oncology

## 2021-06-19 ENCOUNTER — Inpatient Hospital Stay: Payer: Medicare Other

## 2021-06-19 ENCOUNTER — Other Ambulatory Visit: Payer: Self-pay | Admitting: Pharmacist

## 2021-06-19 ENCOUNTER — Other Ambulatory Visit: Payer: Self-pay

## 2021-06-19 DIAGNOSIS — D464 Refractory anemia, unspecified: Secondary | ICD-10-CM | POA: Diagnosis not present

## 2021-06-19 DIAGNOSIS — Z23 Encounter for immunization: Secondary | ICD-10-CM | POA: Insufficient documentation

## 2021-06-19 DIAGNOSIS — D649 Anemia, unspecified: Secondary | ICD-10-CM

## 2021-06-19 DIAGNOSIS — D469 Myelodysplastic syndrome, unspecified: Secondary | ICD-10-CM

## 2021-06-19 LAB — CBC WITH DIFFERENTIAL (CANCER CENTER ONLY)
Abs Immature Granulocytes: 0.03 10*3/uL (ref 0.00–0.07)
Basophils Absolute: 0 10*3/uL (ref 0.0–0.1)
Basophils Relative: 0 %
Eosinophils Absolute: 0.1 10*3/uL (ref 0.0–0.5)
Eosinophils Relative: 1 %
HCT: 29.5 % — ABNORMAL LOW (ref 36.0–46.0)
Hemoglobin: 9.6 g/dL — ABNORMAL LOW (ref 12.0–15.0)
Immature Granulocytes: 1 %
Lymphocytes Relative: 14 %
Lymphs Abs: 0.7 10*3/uL (ref 0.7–4.0)
MCH: 29.9 pg (ref 26.0–34.0)
MCHC: 32.5 g/dL (ref 30.0–36.0)
MCV: 91.9 fL (ref 80.0–100.0)
Monocytes Absolute: 0.8 10*3/uL (ref 0.1–1.0)
Monocytes Relative: 16 %
Neutro Abs: 3.4 10*3/uL (ref 1.7–7.7)
Neutrophils Relative %: 68 %
Platelet Count: 207 10*3/uL (ref 150–400)
RBC: 3.21 MIL/uL — ABNORMAL LOW (ref 3.87–5.11)
RDW: 19.5 % — ABNORMAL HIGH (ref 11.5–15.5)
WBC Count: 5.1 10*3/uL (ref 4.0–10.5)
nRBC: 0.6 % — ABNORMAL HIGH (ref 0.0–0.2)

## 2021-06-19 LAB — SAMPLE TO BLOOD BANK

## 2021-06-19 NOTE — Progress Notes (Signed)
Hemoglobin 9.6 per Dr. Alen Blew, no transfusion needed today. Pt. received 4th Covid vaccine today.    Covid-19 Vaccination Clinic  Name:  Patricia Mata    MRN: [id number] DOB: February 25, 1956  06/19/2021  Patricia Mata was observed post Covid-19 immunization for 15 minutes without incident. She was provided with Vaccine Information Sheet and instruction to access the V-Safe system.   Patricia Mata was instructed to call [phone] with any severe reactions post vaccine: Difficulty breathing  Swelling of face and throat  A fast heartbeat  A bad rash all over body  Dizziness and weakness

## 2021-06-20 ENCOUNTER — Other Ambulatory Visit: Payer: Medicare Other

## 2021-06-21 ENCOUNTER — Other Ambulatory Visit: Payer: Medicare Other

## 2021-06-22 NOTE — Progress Notes (Signed)
Advanced Heart Failure Clinic Note   PCP: [name], FNP PCP-Cardiologist: Quay Burow, MD  Hematologist: Dr. Alen Blew AHF: Dr. Haroldine Laws  Nephrology: Dr Resa Miner Structural Heart Team: Dr Burt Knack.   HPI: Patricia Mata is a 65 y.o. 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    Admitted 10/19  with AF with RVR and recurrent anemia. Developed respiratory failure and intubated. She was transfused and placed on IV amio. Subsequently developed AKI and placed on CVVHD. Converted to NSR on amio and weaned off the vent.Echo showed normal LVEF 65-70% with moderate AS and moderate MS and severe PH with RVSP 62mmHG. She is not a candidate for intervention of AS or MS with advanced PAH and transfusion-dependent myelodysplasia as a result of her colon CA    Underwent [facility] 11/19 that showed normal coronaries and severely elevated PA pressures.-> V/Q scan was negative. She was started on sildenafil and tolerated well. She diuresed with IV lasix and transitioned to lasix 40 mg PO BID. Palliative care was consulted and decision was made to transition to DNR.   She follows with Dr. Alen Blew and requires transfusions every 2-3 weeks as an outpatient. She also developed a buttock wound. WOC was consulted and recommended hydrotherapy for coccygeal osteo.   Was admitted to [facility] for [place] in 9/20.   Had [place] 04/23/19 that showed mild PAH in setting of high-output (likely due to anemia). See data below  Admitted for CAP 3/21 and treated w/ abx. Returned to hospital 3 days later for worsening SOB and admitted for acute hypoxic respiratory failure requiring intubation. Chest CT showed multifocal PNA COVID negative..A/C CHF also felt to be contributing.   Admitted 11/21 for acute on chronic CHF. Echo 11/21 EF 60-65% G2DD severe AS mean 76mmHG AVA 0.84cm2. Moderate MS    Admitted 12/21 for recurrent volume overload and AKI. Cr 6.45. (baseline 2.8-3.5). Diuresed. Creatinine improved to 4.9 on d/c. Weight 221. Dr. Moshe Cipro suggested HD but patient refused.   Admitted 10/09/20 with NSTEMI. Hospital course complicated by AKI>>ESRD. Had RHC/LHC with significant CAD with 60% mid LM disease but otherwise non-obstructive CAD. Structural Heart evaluated and there was concern anatomy would make TAVR difficult. Structural Heart team will re-evaluate after she starts HD. During this hospitalization HD was started.   Admitted 11/21/20 with chest pain. Missed HD appointment. Cardiology/Nephrology consulted. HS trop not elevated. Hgb was down to 7.5. Given unit of blood. Had HD and discharged on 11/23/20.   Evaluated in ED 6/22 for anemia. Given 2 U PRBCs and discharged. Admitted 7/22 for anemia and HD after missing dialysis outpatient. Received 2 units PRBCs and was dialyzed.   Admitted 8/22 for anemia, hgb 5.0. GI consulted and she underwent enteroscopy showing AVM treated with APC and 2 clips.  Colonoscopy with no evidence of active bleeding. She was discharged on home dose of HF medications.  Admitted 10/22 w/ afib RVR. Cards consulted, started on IV amiodarone and converted to SR. EP felt not good candidate for Watchman or ablation. Transitioned to PO amiodarone and discharged.  Several admission for symptomatic anemia requiring transfusions in 11/22. Underwent enteroscopy 05/24/21 with single non-bleeding gastric angiectasia s/p APC.  Today she returns for HF follow up with her husband. Overall feeling fine. Now going to [facility] center weekly for CBCs and requires transfusion every 2-3 weeks. Some SOB w/ walking longer distances. Denies abnormal bleeding,  CP, dizziness, edema, or PND/Orthopnea. Appetite ok. No fever or chills. Dry weight is 87 kg at HD. Taking all medications. Tolerating HD well now M/W/F, has some low diastolic BP readings at HD as low as 30s.   Cardiac  studies: RHC/LHC 10/13/20  Mid RCA lesion is 20% stenosed. Mid LM lesion is 60% stenosed. Ost Cx to Prox Cx lesion is 30% stenosed. Mid LAD lesion is 30% stenosed  Ao = 152/38 (76) LV =  186/25 RA =  6 RV = 76/11 PA = 74/19 (43) PCW = unable to obtain Fick cardiac output/index = 9.1/4.6 PVR = 2.0 (using LVEDP) Ao sat = 96% PA sat = 67%. 67%  AoV: Crossed easily with JR4 and J-wire. Peak-to-peak gradient 75mmHg  Mean gradient 16mmHG AVA 1.4cm2 Assessment:  1. Significant CAD with 60% mid LM disease. O/w non-obstructive CAD 2. EF 60% 3. Moderate to severe pulmonary HTN due to high output 4. Moderate to severe AS Results d/w TAVR team. Given unfavorable anatomy, burden of comorbidites and need for alternative access, I suspect risks of TAVR outweigh possible benefits. Favor medical management.  [place] 10/20 RA = 8 RV = 53/3 PA = 57/12 (32) PCW = 14 (v waves to 32) Fick cardiac output/index = 8.4/4.5 PVR = 2.1 WU Ao sat = 99% PA sat = 73%, 74% High SVC sat = 74%  RHC 11/19 RA  14 RV  110/15 PA 105/40 (62) PCWP 25 Ao sat 91% PA sat 53% Fick CO/CI  6.8/4.3  PVR 5.4  AoV  Mean 82mmHG AVA 1.3 cm2  PFTs 8/20 FEV1 2.24 (112%) FVC 2.63 (103%) DLCO 56%  Echo 02/15/19 EF 65% RV normal size/function. Septal flattening. Severe AS (mean 37mmHG) RVSP 49mmHG.   Echo 11/21 EF 60-65% G2DD severe AS mean 58mmHG AVA 0.84cm2. Moderate MS RVSP 1mmHG.  Review of systems complete and found to be negative unless listed in HPI.    Past Medical History:  Diagnosis Date   Acute renal failure (ARF) ([place]) 06/08/2018   TTHS- [place]   Carotid artery disease [facility])    Chronic diastolic (congestive) heart failure (HCC)    Colon cancer ([place]) 2008   Diabetes mellitus ([place])    Dyspnea    ESRD on hemodialysis ([place])    Gout 09/08/2018   Heart attack ([place])    Heart murmur    Hypertension    Hypothyroidism    Macrocytic anemia    MDS (myelodysplastic syndrome) (HCC)    Moderate mitral  stenosis    PAD (peripheral artery disease) (HCC)    PAF (paroxysmal atrial fibrillation) (HCC)    Pulmonary hypertension (HCC)    Renal artery stenosis (HCC)    a. >60% R renal artery stenosis by duplex 2021, managed medically.   Severe aortic stenosis    Transfusion-dependent anemia     Current Outpatient Medications  Medication Sig Dispense Refill   acetaminophen (TYLENOL) 500 MG tablet Take 1,000 mg by mouth every 6 (six) hours as needed (pain).     allopurinol (ZYLOPRIM) 300 MG tablet Take 300 mg by mouth every morning.     amiodarone (PACERONE) 200 MG tablet Take 1 tablet by mouth once daily 90 tablet 1   atorvastatin (LIPITOR) 40 MG tablet Take 1 tablet by mouth once daily 90 tablet 0   blood glucose meter kit and supplies Dispense based on patient and insurance preference. Use up to four times daily as directed. (FOR ICD-10 E10.9, E11.9). 1 each 3   Blood Glucose Monitoring  Suppl (ONETOUCH ULTRALINK) w/Device KIT Check blood sugars twice daily E11.9 1 kit 0   carvedilol (COREG) 6.25 MG tablet Take 1 tablet (6.25 mg total) by mouth 2 (two) times daily with a meal. 60 tablet 0   diphenhydrAMINE (BENADRYL) 25 MG tablet Take 25 mg by mouth every 8 (eight) hours as needed for itching.     ethyl chloride spray Apply 1 application topically See admin instructions. Apply topically to dialysis site if there is no time to use emla cream     hydrALAZINE (APRESOLINE) 100 MG tablet Take 1 tablet (100 mg total) by mouth 3 (three) times daily. (Patient taking differently: Take 100 mg by mouth See admin instructions. Take one tablet (100 mg) by mouth three times daily - morning, dinner and bedtime) 90 tablet 11   Insulin Glargine (BASAGLAR KWIKPEN) 100 UNIT/ML Inject 20 Units into the skin daily as needed (only if blood sugar over 150).     Insulin Pen Needle 29G X 5MM MISC Use as directed 200 each 0   Lancets (ONETOUCH DELICA PLUS [id number]) MISC check blood sugars twice daily Dx code: E11.22 100  each 3   latanoprost (XALATAN) 0.005 % ophthalmic solution Place 1 drop into both eyes at bedtime.      levothyroxine (SYNTHROID) 175 MCG tablet Take 1 tablet (175 mcg total) by mouth daily before breakfast. 30 tablet 2   lidocaine-prilocaine (EMLA) cream Apply 1 application topically See admin instructions. Apply to dialysis site 30 minutes before arriving at dialysis on Tuesday, Thursday, Saturday     loratadine (CLARITIN) 10 MG tablet Take 1 tablet (10 mg total) by mouth daily as needed for allergies. 90 tablet 2   Methoxy PEG-Epoetin Beta (MIRCERA IJ) Mircera     Multiple Vitamin (MULTIVITAMIN WITH MINERALS) TABS tablet Take 1 tablet by mouth every morning. Centrum Silver 50+     ONETOUCH ULTRA test strip CHECK BLOOD SUGARS TWICE DAILY 100 each 11   pantoprazole (PROTONIX) 40 MG tablet Take 1 tablet (40 mg total) by mouth daily. 90 tablet 6   polyethylene glycol (MIRALAX / GLYCOLAX) 17 g packet Take 17 g by mouth daily as needed for moderate constipation.     sildenafil (REVATIO) 20 MG tablet TAKE 1 TABLET BY MOUTH THREE TIMES DAILY (Patient taking differently: Take 20 mg by mouth See admin instructions. Take one tablet (20 mg) by mouth three times daily - morning, supper and bedtime) 90 tablet 4   sitaGLIPtin (JANUVIA) 25 MG tablet Take 1 tablet (25 mg total) by mouth daily. 30 tablet 2   No current facility-administered medications for this encounter.   Allergies  Allergen Reactions   Lactose Intolerance (Gi) Diarrhea   Morphine And Related Itching   Sulfa Antibiotics Rash and Other (See Comments)    Blisters, also   Social History   Socioeconomic History   Marital status: Married    Spouse name: Not on file   Number of children: Not on file   Years of education: Not on file   Highest education level: Not on file  Occupational History   Occupation: retired  Tobacco Use   Smoking status: Former    Packs/day: 0.25    Years: 20.00    Pack years: 5.00    Types: Cigarettes     Quit date: 1997    Years since quitting: 25.9   Smokeless tobacco: Former  Scientific laboratory technician Use: Never used  Substance and Sexual Activity   Alcohol use: Yes  Alcohol/week: 1.0 standard drink    Types: 1 Standard drinks or equivalent per week    Comment: occ   Drug use: Never   Sexual activity: Not on file  Other Topics Concern   Not on file  Social History Narrative   Not on file   Social Determinants of Health   Financial Resource Strain: Low Risk    Difficulty of Paying Living Expenses: Not hard at all  Food Insecurity: No Food Insecurity   Worried About Charity fundraiser in the Last Year: Never true   [place] in the Last Year: Never true  Transportation Needs: No Transportation Needs   Lack of Transportation (Medical): No   Lack of Transportation (Non-Medical): No  Physical Activity: Inactive   Days of Exercise per Week: 0 days   Minutes of Exercise per Session: 0 min  Stress: No Stress Concern Present   Feeling of Stress : Not at all  Social Connections: Not on file  Intimate Partner Violence: Not on file   Family History  Problem Relation Age of Onset   Hypertension Mother    Diabetes Mother    Cervical cancer Mother    Heart attack Father    Hypertension Sister    Hypertension Brother    Hypertension Sister    Hypertension Sister    Prostate cancer Brother    HIV/AIDS Brother    BP (!) 148/54   Pulse 60   Wt 89 kg   SpO2 100%   BMI 33.68 kg/m   Wt Readings from Last 3 Encounters:  06/26/21 89 kg  06/14/21 87.2 kg  05/24/21 87 kg    PHYSICAL EXAM: General:  NAD. No resp difficulty HEENT: Normal Neck: Supple. No JVD. Carotids 2+ bilat; no bruits. No lymphadenopathy or thryomegaly appreciated. Cor: PMI nondisplaced. Regular rate & rhythm. No rubs, gallops, III/VI AS Lungs: Clear Abdomen: Soft, nontender, nondistended. No hepatosplenomegaly. No bruits or masses. Good bowel sounds. Extremities: No cyanosis, clubbing, rash, edema; RUE  fistula +/+ Neuro: Alert & oriented x 3, cranial nerves grossly intact. Moves all 4 extremities w/o difficulty. Affect pleasant.  ASSESSMENT & PLAN:  1. PAH - Echo 05/12/18 LVEF 65-70%, Grade 2 DD, Mod AS, Mild/Mod MS, Mod TR, Trivial PI, PA peak pressure 93.  - L/RHC 05/21/18 with no significant CAD, severe pulmonary HTN with PA 105/40 (62) PCW 33 PVR 5.4 WU - Suspect multifactorial in nature suspect including high output due to anemia, WHO group II (diastolic HF and valvular disease) and III (OSA) but given degree of PH cannot exclude WHO Group I component. - Repeat RHC 04/23/19 with much improved pulmonary pressures. PA 57/12 (32) PCWP 14 (v to 32) - Echo 11/21 EF 60-65% G2DD severe AS mean 20mHG AVA 0.84cm2. Moderate MS RVSP 569mG - VQ negative. - PFTs 8/20 with normal spirometry and moderately reduced DLCO. - Home sleep study ordered but has not been completed. - Continue sildenafil 20 mg tid cautiously (has AS).   2. Chronic diastolic HF - Echo 8/2/56F 65% with normal RV size and function. + septal flattening. RVSP 64 mmHG. - Echo 11/21 EF 60-65% G2DD severe AS mean 3549m AVA 0.84cm2. Moderate MS RVSP 54 mmHG. - Echo 3/22 EF 50-55%, RV mildly reduced, degenerative mitral valve, moderate MS - Multiple hospitalizations for ADHF/AKI. - NYHA II. Volume managed on HD T/TH/Sat. Volume good today. - Continue carvedilol 6.25 mg bid. - Continue hydralazine 100 mg tid.  3. Severe AS/Moderate MS - Echos 8/20  and again 3/21 with severe AS  - Echo 11/21 EF 60-65% G2DD severe AS mean 30mHG AVA 0.84cm2. Moderate MS RVSP 575mG - She has seen Dr. ShAlen Blewnd he has made it clear that she has a terminal illness but he would have no issue with using Plavix if needed - Had cath 10/2020 AoV: Crossed easily with JR4 and J-wire. Peak-to-peak gradient 3656m  Mean gradient 62m30mAVA 1.4cm2. Anatomy may not be suitable for TAVR.  - Given anatomy and co-morbidities, risk of TAVR outweigh benefit. Manage  medically. - She has follow up echo and visit with Dr. CoopBurt Knackt month.   4. PAF - Regular on exam.  - Continue amiodarone 200 daily. TSH ok 7/22. LFTs ok 11/22. - Continue to avoid anticoagulation with transfusion dependent anemia. (chronic).  - CHA2DS2/VASc is at least 4.    5. ESRD - Tolerating HD well M/W/F .  6. 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 - Transfuse as needed per [place] CBCs followed by [place] H/o Upper GI Bleed - Denies abnormal bleeding. - Has follow up with Dr. SchoMichail SermonContinue PPI.  8. HTN - Stable today. -.Continue hydralazine 100 mg tid. - Now off clonidine.  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 - No further issues.   Follow up in 2 months with Dr [place]ine [place] 06/26/21

## 2021-06-25 ENCOUNTER — Ambulatory Visit: Payer: Medicare Other

## 2021-06-25 ENCOUNTER — Other Ambulatory Visit: Payer: Medicare Other

## 2021-06-25 ENCOUNTER — Encounter: Payer: Self-pay | Admitting: Nurse Practitioner

## 2021-06-25 ENCOUNTER — Ambulatory Visit: Payer: Medicare Other | Admitting: Oncology

## 2021-06-26 ENCOUNTER — Other Ambulatory Visit: Payer: Medicare Other

## 2021-06-26 ENCOUNTER — Encounter (HOSPITAL_COMMUNITY): Payer: Self-pay

## 2021-06-26 ENCOUNTER — Other Ambulatory Visit: Payer: Self-pay

## 2021-06-26 ENCOUNTER — Ambulatory Visit: Payer: Medicare Other | Admitting: Oncology

## 2021-06-26 ENCOUNTER — Ambulatory Visit (HOSPITAL_COMMUNITY)
Admission: RE | Admit: 2021-06-26 | Discharge: 2021-06-26 | Disposition: A | Discharge status: Home / Self Care | Payer: Medicare Other | Source: Ambulatory Visit | Attending: Internal Medicine | Admitting: Internal Medicine

## 2021-06-26 VITALS — BP 148/54 | HR 60 | Wt 196.2 lb

## 2021-06-26 DIAGNOSIS — I2721 Secondary pulmonary arterial hypertension: Secondary | ICD-10-CM | POA: Diagnosis not present

## 2021-06-26 DIAGNOSIS — I132 Hypertensive heart and chronic kidney disease with heart failure and with stage 5 chronic kidney disease, or end stage renal disease: Secondary | ICD-10-CM | POA: Insufficient documentation

## 2021-06-26 DIAGNOSIS — I252 Old myocardial infarction: Secondary | ICD-10-CM | POA: Diagnosis not present

## 2021-06-26 DIAGNOSIS — Z992 Dependence on renal dialysis: Secondary | ICD-10-CM | POA: Insufficient documentation

## 2021-06-26 DIAGNOSIS — D631 Anemia in chronic kidney disease: Secondary | ICD-10-CM | POA: Diagnosis not present

## 2021-06-26 DIAGNOSIS — E1151 Type 2 diabetes mellitus with diabetic peripheral angiopathy without gangrene: Secondary | ICD-10-CM | POA: Diagnosis not present

## 2021-06-26 DIAGNOSIS — E1122 Type 2 diabetes mellitus with diabetic chronic kidney disease: Secondary | ICD-10-CM | POA: Diagnosis not present

## 2021-06-26 DIAGNOSIS — M869 Osteomyelitis, unspecified: Secondary | ICD-10-CM

## 2021-06-26 DIAGNOSIS — I2510 Atherosclerotic heart disease of native coronary artery without angina pectoris: Secondary | ICD-10-CM | POA: Insufficient documentation

## 2021-06-26 DIAGNOSIS — I5032 Chronic diastolic (congestive) heart failure: Secondary | ICD-10-CM | POA: Insufficient documentation

## 2021-06-26 DIAGNOSIS — N186 End stage renal disease: Secondary | ICD-10-CM | POA: Insufficient documentation

## 2021-06-26 DIAGNOSIS — Z66 Do not resuscitate: Secondary | ICD-10-CM | POA: Diagnosis not present

## 2021-06-26 DIAGNOSIS — I350 Nonrheumatic aortic (valve) stenosis: Secondary | ICD-10-CM | POA: Diagnosis not present

## 2021-06-26 DIAGNOSIS — R0602 Shortness of breath: Secondary | ICD-10-CM | POA: Diagnosis present

## 2021-06-26 DIAGNOSIS — Z87891 Personal history of nicotine dependence: Secondary | ICD-10-CM | POA: Diagnosis not present

## 2021-06-26 DIAGNOSIS — I480 Paroxysmal atrial fibrillation: Secondary | ICD-10-CM | POA: Insufficient documentation

## 2021-06-26 DIAGNOSIS — Z7901 Long term (current) use of anticoagulants: Secondary | ICD-10-CM | POA: Insufficient documentation

## 2021-06-26 DIAGNOSIS — Z09 Encounter for follow-up examination after completed treatment for conditions other than malignant neoplasm: Secondary | ICD-10-CM | POA: Insufficient documentation

## 2021-06-26 DIAGNOSIS — I272 Pulmonary hypertension, unspecified: Secondary | ICD-10-CM | POA: Diagnosis not present

## 2021-06-26 DIAGNOSIS — Z6833 Body mass index (BMI) 33.0-33.9, adult: Secondary | ICD-10-CM | POA: Insufficient documentation

## 2021-06-26 DIAGNOSIS — D469 Myelodysplastic syndrome, unspecified: Secondary | ICD-10-CM | POA: Diagnosis not present

## 2021-06-26 DIAGNOSIS — Z8719 Personal history of other diseases of the digestive system: Secondary | ICD-10-CM

## 2021-06-26 DIAGNOSIS — Z79899 Other long term (current) drug therapy: Secondary | ICD-10-CM | POA: Diagnosis not present

## 2021-06-26 DIAGNOSIS — G4733 Obstructive sleep apnea (adult) (pediatric): Secondary | ICD-10-CM | POA: Diagnosis not present

## 2021-06-26 DIAGNOSIS — D649 Anemia, unspecified: Secondary | ICD-10-CM

## 2021-06-26 DIAGNOSIS — I10 Essential (primary) hypertension: Secondary | ICD-10-CM

## 2021-06-26 NOTE — Patient Instructions (Addendum)
No labs or medication changes has been placed today.  Your follow up appointment in the clinic is on August 27, 2021 at 1:40 with Dr. Haroldine Laws.  At the [facility], you and your health needs are our priority. As part of our continuing mission to provide you with exceptional heart care, we have created designated Provider Care Teams. These Care Teams include your primary Cardiologist (physician) and Advanced Practice Providers (APPs- Physician Assistants and Nurse Practitioners) who all work together to provide you with the care you need, when you need it.   You may see any of the following providers on your designated Care Team at your next follow up: Dr Glori Bickers Dr Haynes Kerns, NP Lyda Jester, [place] [facility] [place], [place] Audry Riles, PharmD   Please be sure to bring in all your medications bottles to every appointment.    It was great to see you today! No medication changes are needed at this time.

## 2021-06-27 ENCOUNTER — Other Ambulatory Visit: Payer: Medicare Other

## 2021-06-28 ENCOUNTER — Telehealth: Payer: Self-pay | Admitting: *Deleted

## 2021-06-28 ENCOUNTER — Other Ambulatory Visit: Payer: Self-pay

## 2021-06-28 ENCOUNTER — Inpatient Hospital Stay: Payer: Medicare Other

## 2021-06-28 DIAGNOSIS — D500 Iron deficiency anemia secondary to blood loss (chronic): Secondary | ICD-10-CM

## 2021-06-28 DIAGNOSIS — D469 Myelodysplastic syndrome, unspecified: Secondary | ICD-10-CM

## 2021-06-28 DIAGNOSIS — D649 Anemia, unspecified: Secondary | ICD-10-CM

## 2021-06-28 DIAGNOSIS — D464 Refractory anemia, unspecified: Secondary | ICD-10-CM | POA: Diagnosis not present

## 2021-06-28 LAB — CMP (CANCER CENTER ONLY)
ALT: 20 U/L (ref 0–44)
AST: 34 U/L (ref 15–41)
Albumin: 3.5 g/dL (ref 3.5–5.0)
Alkaline Phosphatase: 124 U/L (ref 38–126)
Anion gap: 11 (ref 5–15)
BUN: 38 mg/dL — ABNORMAL HIGH (ref 8–23)
CO2: 30 mmol/L (ref 22–32)
Calcium: 9.2 mg/dL (ref 8.9–10.3)
Chloride: 102 mmol/L (ref 98–111)
Creatinine: 4.12 mg/dL (ref 0.44–1.00)
GFR, Estimated: 11 mL/min — ABNORMAL LOW (ref >60)
Glucose, Bld: 125 mg/dL — ABNORMAL HIGH (ref 70–99)
Potassium: 3.9 mmol/L (ref 3.5–5.1)
Sodium: 143 mmol/L (ref 135–145)
Total Bilirubin: 1.4 mg/dL — ABNORMAL HIGH (ref 0.3–1.2)
Total Protein: 7 g/dL (ref 6.5–8.1)

## 2021-06-28 LAB — CBC WITH DIFFERENTIAL (CANCER CENTER ONLY)
Abs Immature Granulocytes: 0.01 10*3/uL (ref 0.00–0.07)
Basophils Absolute: 0 10*3/uL (ref 0.0–0.1)
Basophils Relative: 0 %
Eosinophils Absolute: 0 10*3/uL (ref 0.0–0.5)
Eosinophils Relative: 1 %
HCT: 26.9 % — ABNORMAL LOW (ref 36.0–46.0)
Hemoglobin: 8.9 g/dL — ABNORMAL LOW (ref 12.0–15.0)
Immature Granulocytes: 0 %
Lymphocytes Relative: 18 %
Lymphs Abs: 0.8 10*3/uL (ref 0.7–4.0)
MCH: 30.8 pg (ref 26.0–34.0)
MCHC: 33.1 g/dL (ref 30.0–36.0)
MCV: 93.1 fL (ref 80.0–100.0)
Monocytes Absolute: 0.8 10*3/uL (ref 0.1–1.0)
Monocytes Relative: 18 %
Neutro Abs: 2.7 10*3/uL (ref 1.7–7.7)
Neutrophils Relative %: 63 %
Platelet Count: 161 10*3/uL (ref 150–400)
RBC: 2.89 MIL/uL — ABNORMAL LOW (ref 3.87–5.11)
RDW: 21.1 % — ABNORMAL HIGH (ref 11.5–15.5)
WBC Count: 4.3 10*3/uL (ref 4.0–10.5)
nRBC: 0 % (ref 0.0–0.2)

## 2021-06-28 LAB — SAMPLE TO BLOOD BANK

## 2021-06-28 NOTE — Telephone Encounter (Signed)
CRITICAL VALUE STICKER  CRITICAL VALUE: creatnine 4.12  RECEIVER (on-site recipient of call): Velna Ochs RN  DATE & TIME NOTIFIED: 06/28/21 @ 1145  MESSENGER (representative from lab): Ulice Dash  MD NOTIFIED: Dr. Alen Blew  TIME OF NOTIFICATION: 4235  RESPONSE:  MD aware

## 2021-06-28 NOTE — Progress Notes (Signed)
HGB 8.9  Per Dr. Alen Blew no blood transfusion.

## 2021-06-28 NOTE — Patient Instructions (Signed)
Blood Transfusion, Adult, Care After This sheet gives you information about how to care for yourself after your procedure. Your doctor may also give you more specific instructions. If you have problems or questions, contact your doctor. What can I expect after the procedure? After the procedure, it is common to have: Bruising and soreness at the IV site. A headache. Follow these instructions at home: Insertion site care   Follow instructions from your doctor about how to take care of your insertion site. This is where an IV tube was put into your vein. Make sure you: Wash your hands with soap and water before and after you change your bandage (dressing). If you cannot use soap and water, use hand sanitizer. Change your bandage as told by your doctor. Check your insertion site every day for signs of infection. Check for: Redness, swelling, or pain. Bleeding from the site. Warmth. Pus or a bad smell. General instructions Take over-the-counter and prescription medicines only as told by your doctor. Rest as told by your doctor. Go back to your normal activities as told by your doctor. Keep all follow-up visits as told by your doctor. This is important. Contact a doctor if: You have itching or red, swollen areas of skin (hives). You feel worried or nervous (anxious). You feel weak after doing your normal activities. You have redness, swelling, warmth, or pain around the insertion site. You have blood coming from the insertion site, and the blood does not stop with pressure. You have pus or a bad smell coming from the insertion site. Get help right away if: You have signs of a serious reaction. This may be coming from an allergy or the body's defense system (immune system). Signs include: Trouble breathing or shortness of breath. Swelling of the face or feeling warm (flushed). Fever or chills. Head, chest, or back pain. Dark pee (urine) or blood in the pee. Widespread rash. Fast  heartbeat. Feeling dizzy or light-headed. You may receive your blood transfusion in an outpatient setting. If so, you will be told whom to contact to report any reactions. These symptoms may be an emergency. Do not wait to see if the symptoms will go away. Get medical help right away. Call your local emergency services (911 in the [place] Do not drive yourself to the hospital. Summary Bruising and soreness at the IV site are common. Check your insertion site every day for signs of infection. Rest as told by your doctor. Go back to your normal activities as told by your doctor. Get help right away if you have signs of a serious reaction. This information is not intended to replace advice given to you by your health care provider. Make sure you discuss any questions you have with your health care provider. Document Revised: 10/26/2020 Document Reviewed: 12/24/2018 Elsevier Patient Education  2022 [employer] Inc.  

## 2021-06-29 ENCOUNTER — Telehealth (HOSPITAL_COMMUNITY): Payer: Self-pay | Admitting: Surgery

## 2021-06-29 NOTE — Telephone Encounter (Signed)
I called patient regarding ordered home sleep study.  I left a message to indicate that we have not received the results and I am unsure if she has attempted to complete the study again without success.  I requested that she call me back with questions or concerns.

## 2021-07-03 ENCOUNTER — Other Ambulatory Visit: Payer: Self-pay

## 2021-07-03 ENCOUNTER — Inpatient Hospital Stay (HOSPITAL_BASED_OUTPATIENT_CLINIC_OR_DEPARTMENT_OTHER): Payer: Medicare Other | Admitting: Oncology

## 2021-07-03 ENCOUNTER — Inpatient Hospital Stay: Payer: Medicare Other

## 2021-07-03 ENCOUNTER — Other Ambulatory Visit: Payer: Self-pay | Admitting: *Deleted

## 2021-07-03 VITALS — BP 170/49 | HR 59 | Temp 98.5°F | Resp 20

## 2021-07-03 VITALS — BP 153/39 | HR 61 | Temp 97.7°F | Resp 20 | Ht 64.0 in | Wt 197.7 lb

## 2021-07-03 DIAGNOSIS — D464 Refractory anemia, unspecified: Secondary | ICD-10-CM | POA: Diagnosis not present

## 2021-07-03 DIAGNOSIS — D469 Myelodysplastic syndrome, unspecified: Secondary | ICD-10-CM

## 2021-07-03 DIAGNOSIS — D500 Iron deficiency anemia secondary to blood loss (chronic): Secondary | ICD-10-CM

## 2021-07-03 DIAGNOSIS — D649 Anemia, unspecified: Secondary | ICD-10-CM

## 2021-07-03 LAB — CBC WITH DIFFERENTIAL (CANCER CENTER ONLY)
Abs Immature Granulocytes: 0.02 10*3/uL (ref 0.00–0.07)
Basophils Absolute: 0 10*3/uL (ref 0.0–0.1)
Basophils Relative: 0 %
Eosinophils Absolute: 0.1 10*3/uL (ref 0.0–0.5)
Eosinophils Relative: 1 %
HCT: 22.9 % — ABNORMAL LOW (ref 36.0–46.0)
Hemoglobin: 7.7 g/dL — ABNORMAL LOW (ref 12.0–15.0)
Immature Granulocytes: 0 %
Lymphocytes Relative: 14 %
Lymphs Abs: 0.6 10*3/uL — ABNORMAL LOW (ref 0.7–4.0)
MCH: 31.3 pg (ref 26.0–34.0)
MCHC: 33.6 g/dL (ref 30.0–36.0)
MCV: 93.1 fL (ref 80.0–100.0)
Monocytes Absolute: 0.7 10*3/uL (ref 0.1–1.0)
Monocytes Relative: 15 %
Neutro Abs: 3.1 10*3/uL (ref 1.7–7.7)
Neutrophils Relative %: 70 %
Platelet Count: 166 10*3/uL (ref 150–400)
RBC: 2.46 MIL/uL — ABNORMAL LOW (ref 3.87–5.11)
RDW: 20.8 % — ABNORMAL HIGH (ref 11.5–15.5)
WBC Count: 4.5 10*3/uL (ref 4.0–10.5)
nRBC: 0 % (ref 0.0–0.2)

## 2021-07-03 LAB — PREPARE RBC (CROSSMATCH)

## 2021-07-03 MED ORDER — DIPHENHYDRAMINE HCL 25 MG PO CAPS
25.0000 mg | ORAL_CAPSULE | Freq: Once | ORAL | Status: AC
  Administered 2021-07-03: 10:00:00 25 mg via ORAL
  Filled 2021-07-03: qty 1

## 2021-07-03 MED ORDER — SODIUM CHLORIDE 0.9% IV SOLUTION
250.0000 mL | Freq: Once | INTRAVENOUS | Status: AC
  Administered 2021-07-03: 10:00:00 250 mL via INTRAVENOUS

## 2021-07-03 MED ORDER — LUSPATERCEPT-AAMT 75 MG ~~LOC~~ SOLR
1.7500 mg/kg | Freq: Once | SUBCUTANEOUS | Status: AC
  Administered 2021-07-03: 150 mg via SUBCUTANEOUS
  Filled 2021-07-03: qty 3

## 2021-07-03 MED ORDER — ACETAMINOPHEN 325 MG PO TABS
650.0000 mg | ORAL_TABLET | Freq: Once | ORAL | Status: AC
  Administered 2021-07-03: 10:00:00 650 mg via ORAL
  Filled 2021-07-03: qty 2

## 2021-07-03 NOTE — Progress Notes (Signed)
Pt. blood pressure elevated 175/40,170/49 after blood transfusions.  Pt. denies chest pain, dizziness, and no shortness of breath noted. Dr. Alen Blew notified and pt. ok for discharge. Pt. states she will take home BP medications at 1700.

## 2021-07-03 NOTE — Addendum Note (Signed)
Addended by: [name] on: 07/03/2021 11:30 AM   Modules accepted: Orders

## 2021-07-03 NOTE — Progress Notes (Signed)
Hematology and Oncology Follow Up Visit  Patricia Mata [id number] 27-Apr-1956 65 y.o. 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  Secondary diagnosis: Stage III colon cancer diagnosed in 2008.  No additional treatment needed and continues to be disease-free.    Prior Therapy: She is S/P hemicolectomy done in 11/2006. 1/13 lymph nodes are positive.  This was followed by 12 cycles of FOLFOX completed in 05/2007  Aranesp 300 mcg every 2 to 3 weeks therapy was ineffective to eliminate transfusion needs.  5-azacytidine at 75 mg/m square subcutaneously started in October 2018.  She is status post 7 cycles of therapy completed in April 2019.   Current therapy:   Reblozyl 75 mg subcutaneous injection every 3 weeks started in December 2021.    Red cell transfusion to keep hemoglobin above 8.   Interim History:  Patricia Mata is here for a follow-up visit.  Since the last visit, she reports doing well without any major complaints.  She denies any nausea, vomiting or abdominal pain.  She denies any chest pain or difficulty breathing.  She denies any hospitalizations or illnesses.  He denies any hematochezia or melena.  She is still dialysis dependent and has tolerated it well.    Medication: Updated on review. Current Outpatient Medications on File Prior to Visit  Medication Sig Dispense Refill   allopurinol (ZYLOPRIM) 100 MG tablet Take 100 mg by mouth daily.     amiodarone (PACERONE) 200 MG tablet Take 200 mg by mouth daily.     amLODipine (NORVASC) 10 MG tablet Take 1 tablet (10 mg total) by mouth daily. 90 tablet 3   atorvastatin (LIPITOR) 40 MG tablet TAKE 1 TABLET BY MOUTH EVERY DAY 90 tablet 0   blood glucose meter kit and supplies Dispense based on patient and insurance preference. Use up to four times daily as directed. (FOR ICD-10 E10.9, E11.9). 1 each 3   colchicine 0.6 MG tablet Take  0.6 mg by mouth 2 (two) times daily as needed (for gout).     doxazosin (CARDURA) 1 MG tablet Take 1 tablet (1 mg total) by mouth at bedtime. 30 tablet 6   doxycycline (ADOXA) 100 MG tablet Take 1 tablet (100 mg total) by mouth 2 (two) times daily for 7 days. 14 tablet 0   furosemide (LASIX) 40 MG tablet TAKE 1 TABLET BY MOUTH EVERY DAY 90 tablet 1   gabapentin (NEURONTIN) 300 MG capsule      glucose blood test strip Use as instructed 100 each 12   hydrALAZINE (APRESOLINE) 100 MG tablet Take 1 tablet (100 mg total) by mouth 3 (three) times daily. 180 tablet 5   insulin glargine (LANTUS) 100 unit/mL SOPN Inject 0.15 mLs (15 Units total) into the skin daily. (Patient taking differently: Inject 15 Units into the skin daily as needed (blood sugar over 150). )     Insulin Pen Needle 29G X 5MM MISC Use as directed 200 each 0   latanoprost (XALATAN) 0.005 % ophthalmic solution Place 1 drop into both eyes at bedtime.      levothyroxine (SYNTHROID) 88 MCG tablet Take 1 tablet (88 mcg total) by mouth daily. 30 tablet 2   metoprolol succinate (TOPROL-XL) 25 MG 24 hr tablet Take 1 tablet (25 mg total) by mouth daily. STOP taking this unless heart rate >100bpm OR directed by your doctor 30 tablet 5   Multiple Vitamins-Minerals (CENTRUM SILVER 50+WOMEN) TABS  Take 1 tablet by mouth daily.     sildenafil (REVATIO) 20 MG tablet Take 1 tablet (20 mg total) by mouth 3 (three) times daily. 270 tablet 3   sitaGLIPtin (JANUVIA) 50 MG tablet Take 1 tablet (50 mg total) by mouth daily. 90 tablet 0               Allergies:  Allergies  Allergen Reactions   Lactose Intolerance (Gi) Diarrhea   Morphine And Related Itching   Sulfa Antibiotics Rash and Other (See Comments)    Blisters, also     Physical Exam:        Blood pressure (!) 153/39, pulse 61, temperature 97.7 F (36.5 C), temperature source Tympanic, resp. rate 20, height '5\' 4"'  (1.626 m), weight 197 lb 11.2 oz (89.7 kg), SpO2 100  %.      ECOG: 2    General appearance: Alert, awake without any distress. Head: Atraumatic without abnormalities Oropharynx: Without any thrush or ulcers. Eyes: No scleral icterus. Lymph nodes: No lymphadenopathy noted in the cervical, supraclavicular, or axillary nodes Heart:regular rate and rhythm, without any murmurs or gallops.   Lung: Clear to auscultation without any rhonchi, wheezes or dullness to percussion. Abdomin: Soft, nontender without any shifting dullness or ascites. Musculoskeletal: No clubbing or cyanosis. Neurological: No motor or sensory deficits. Skin: No rashes or lesions.                   Lab Results: Lab Results  Component Value Date   WBC 4.3 06/28/2021   HGB 8.9 (L) 06/28/2021   HCT 26.9 (L) 06/28/2021   MCV 93.1 06/28/2021   PLT 161 06/28/2021     Chemistry      Component Value Date/Time   NA 143 06/28/2021 1059   NA 139 09/05/2020 1253   NA 134 (L) 05/26/2017 0949   K 3.9 06/28/2021 1059   K 4.0 05/26/2017 0949   CL 102 06/28/2021 1059   CL 96 (L) 03/31/2012 1513   CO2 30 06/28/2021 1059   CO2 24 05/26/2017 0949   BUN 38 (H) 06/28/2021 1059   BUN 89 (HH) 09/05/2020 1253   BUN 29.2 (H) 05/26/2017 0949   CREATININE 4.12 (HH) 06/28/2021 1059   CREATININE 2.24 (H) 01/27/2019 1355   CREATININE 1.4 (H) 05/26/2017 0949      Component Value Date/Time   CALCIUM 9.2 06/28/2021 1059   CALCIUM 8.9 05/26/2017 0949   ALKPHOS 124 06/28/2021 1059   ALKPHOS 157 (H) 05/26/2017 0949   AST 34 06/28/2021 1059   AST 29 05/26/2017 0949   ALT 20 06/28/2021 1059   ALT 23 05/26/2017 0949   BILITOT 1.4 (H) 06/28/2021 1059   BILITOT 2.19 (H) 05/26/2017 0949      Impression and Plan:  65 year old woman with:   1. 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  She continues to be on supportive management as well as reblozyl.  Her hemoglobin overall has been adequate with supportive transfusion and remains  asymptomatic.  Risks and benefits of continuing this treatment were discussed at this time and she is agreeable to continue.  Alternative therapies including 5-azacytidine were reviewed.  I do not believe that we will alter her overall prognosis and quality of life at this time.  We have deferred this option for the time being.  She is agreeable with this plan.   2.  Anemia: She is currently on supportive transfusion and is well tolerated.  She  is agreeable to continue at this time.   3.  Prognosis: Aggressive measures are still warranted given her reasonable performance status.  She has multiple comorbid conditions however.  4.  Renal failure: She remains hemodialysis dependent currently.  8.   Follow-up: She will continue weekly labs and injection every 3 weeks.  MD follow-up will be in 9 weeks.  30  minutes were dedicated to this visit.  The time was spent on reviewing laboratory data, disease status update and outlining future plan of care review.   Zola Button, MD 12/20/20228:31 AM

## 2021-07-03 NOTE — Patient Instructions (Addendum)
Blood Transfusion, Adult, Care After This sheet gives you information about how to care for yourself after your procedure. Your doctor may also give you more specific instructions. If you have problems or questions, contact your doctor. What can I expect after the procedure? After the procedure, it is common to have: Bruising and soreness at the IV site. A headache. Follow these instructions at home: Insertion site care   Follow instructions from your doctor about how to take care of your insertion site. This is where an IV tube was put into your vein. Make sure you: Wash your hands with soap and water before and after you change your bandage (dressing). If you cannot use soap and water, use hand sanitizer. Change your bandage as told by your doctor. Check your insertion site every day for signs of infection. Check for: Redness, swelling, or pain. Bleeding from the site. Warmth. Pus or a bad smell. General instructions Take over-the-counter and prescription medicines only as told by your doctor. Rest as told by your doctor. Go back to your normal activities as told by your doctor. Keep all follow-up visits as told by your doctor. This is important. Contact a doctor if: You have itching or red, swollen areas of skin (hives). You feel worried or nervous (anxious). You feel weak after doing your normal activities. You have redness, swelling, warmth, or pain around the insertion site. You have blood coming from the insertion site, and the blood does not stop with pressure. You have pus or a bad smell coming from the insertion site. Get help right away if: You have signs of a serious reaction. This may be coming from an allergy or the body's defense system (immune system). Signs include: Trouble breathing or shortness of breath. Swelling of the face or feeling warm (flushed). Fever or chills. Head, chest, or back pain. Dark pee (urine) or blood in the pee. Widespread rash. Fast  heartbeat. Feeling dizzy or light-headed. You may receive your blood transfusion in an outpatient setting. If so, you will be told whom to contact to report any reactions. These symptoms may be an emergency. Do not wait to see if the symptoms will go away. Get medical help right away. Call your local emergency services (911 in the [place] Do not drive yourself to the hospital. Summary Bruising and soreness at the IV site are common. Check your insertion site every day for signs of infection. Rest as told by your doctor. Go back to your normal activities as told by your doctor. Get help right away if you have signs of a serious reaction. This information is not intended to replace advice given to you by your health care provider. Make sure you discuss any questions you have with your health care provider.  Luspatercept Injection What is this medication? LUSPATERCEPT (lus PAT er sept) treats low levels of red blood cells (anemia) in the body in people with beta thalassemia or myelodysplastic syndromes. It works by helping the body make more red blood cells. This medicine may be used for other purposes; ask your health care provider or pharmacist if you have questions. COMMON BRAND NAME(S): REBLOZYL What should I tell my care team before I take this medication? They need to know if you have any of these conditions: Have had your spleen removed High blood pressure History of blood clots Tobacco use An unusual or allergic reaction to luspatercept, other medications, foods, dyes or preservatives Pregnant or trying to get pregnant Breast-feeding How should I use this  medication? This medication is for injection under the skin. It is given by your care team in a hospital or clinic setting. Talk to your care team about the use of the medication in children. This medication is not approved for use in children. Overdosage: If you think you have taken too much of this medicine contact a poison control  center or emergency room at once. NOTE: This medicine is only for you. Do not share this medicine with others. What if I miss a dose? Keep appointments for follow-up doses. It is important not to miss your dose. Call your care team if you are unable to keep an appointment. What may interact with this medication? Interactions are not expected. This list may not describe all possible interactions. Give your health care provider a list of all the medicines, herbs, non-prescription drugs, or dietary supplements you use. Also tell them if you smoke, drink alcohol, or use illegal drugs. Some items may interact with your medicine. What should I watch for while using this medication? Your condition will be monitored carefully while you are receiving this medication. Talk to your care team if you wish to become pregnant or think you might be pregnant. This medication can cause serious birth defects. Discuss contraceptive options with your care team. Do not breastfeed while taking this medication. You may need blood work done while you are taking this medication. What side effects may I notice from receiving this medication? Side effects that you should report to your care team as soon as possible: Allergic reactions--skin rash, itching, hives, swelling of the face, lips, tongue, or throat Blood clot--pain, swelling, or warmth in the leg, shortness of breath, chest pain Increase in blood pressure Severe back pain, numbness or weakness of the hands, arms, legs, or feet, loss of coordination, loss of bowel or bladder control Side effects that usually do not require medical attention (report these to your care team if they continue or are bothersome): Bone pain Dizziness Fatigue Headache Joint pain Muscle pain Stomach pain This list may not describe all possible side effects. Call your doctor for medical advice about side effects. You may report side effects to FDA at 1-800-FDA-1088. Where should I keep  my medication? This medication is given in a hospital or clinic. It will not be stored at home. NOTE: This sheet is a summary. It may not cover all possible information. If you have questions about this medicine, talk to your doctor, pharmacist, or health care provider.  2022 Elsevier/Gold Standard (2021-01-26 00:00:00)  Document Revised: 10/26/2020 Document Reviewed: 12/24/2018 Elsevier Patient Education  [place].

## 2021-07-04 LAB — TYPE AND SCREEN
ABO/RH(D): A NEG
Antibody Screen: NEGATIVE
Unit division: 0
Unit division: 0

## 2021-07-04 LAB — BPAM RBC
Blood Product Expiration Date: 202212302359
Blood Product Expiration Date: 202301102359
ISSUE DATE / TIME: 202212201030
ISSUE DATE / TIME: 202212201030
Unit Type and Rh: 600
Unit Type and Rh: 600

## 2021-07-11 ENCOUNTER — Telehealth: Payer: Self-pay

## 2021-07-11 ENCOUNTER — Other Ambulatory Visit: Payer: Self-pay

## 2021-07-11 ENCOUNTER — Telehealth: Payer: Self-pay | Admitting: *Deleted

## 2021-07-11 ENCOUNTER — Inpatient Hospital Stay: Payer: Medicare Other

## 2021-07-11 DIAGNOSIS — D464 Refractory anemia, unspecified: Secondary | ICD-10-CM | POA: Diagnosis not present

## 2021-07-11 DIAGNOSIS — D649 Anemia, unspecified: Secondary | ICD-10-CM

## 2021-07-11 DIAGNOSIS — D469 Myelodysplastic syndrome, unspecified: Secondary | ICD-10-CM

## 2021-07-11 LAB — CBC WITH DIFFERENTIAL (CANCER CENTER ONLY)
Abs Immature Granulocytes: 0.03 10*3/uL (ref 0.00–0.07)
Basophils Absolute: 0 10*3/uL (ref 0.0–0.1)
Basophils Relative: 0 %
Eosinophils Absolute: 0 10*3/uL (ref 0.0–0.5)
Eosinophils Relative: 0 %
HCT: 29.8 % — ABNORMAL LOW (ref 36.0–46.0)
Hemoglobin: 10 g/dL — ABNORMAL LOW (ref 12.0–15.0)
Immature Granulocytes: 1 %
Lymphocytes Relative: 15 %
Lymphs Abs: 0.7 10*3/uL (ref 0.7–4.0)
MCH: 29.9 pg (ref 26.0–34.0)
MCHC: 33.6 g/dL (ref 30.0–36.0)
MCV: 89.2 fL (ref 80.0–100.0)
Monocytes Absolute: 0.7 10*3/uL (ref 0.1–1.0)
Monocytes Relative: 15 %
Neutro Abs: 3.4 10*3/uL (ref 1.7–7.7)
Neutrophils Relative %: 69 %
Platelet Count: 186 10*3/uL (ref 150–400)
RBC: 3.34 MIL/uL — ABNORMAL LOW (ref 3.87–5.11)
RDW: 19.7 % — ABNORMAL HIGH (ref 11.5–15.5)
WBC Count: 4.9 10*3/uL (ref 4.0–10.5)
nRBC: 0.4 % — ABNORMAL HIGH (ref 0.0–0.2)

## 2021-07-11 LAB — SAMPLE TO BLOOD BANK

## 2021-07-11 NOTE — Chronic Care Management (AMB) (Signed)
° ° °  Chronic Care Management Pharmacy Assistant   Name: Patricia Mata  MRN: [id number] DOB: [birth year]   Reason for Encounter:Reschedule appointment   Medications: Outpatient Encounter Medications as of 07/11/2021  Medication Sig Note   acetaminophen (TYLENOL) 500 MG tablet Take 1,000 mg by mouth every 6 (six) hours as needed (pain).    allopurinol (ZYLOPRIM) 300 MG tablet Take 300 mg by mouth every morning.    amiodarone (PACERONE) 200 MG tablet Take 1 tablet by mouth once daily    atorvastatin (LIPITOR) 40 MG tablet Take 1 tablet by mouth once daily    blood glucose meter kit and supplies Dispense based on patient and insurance preference. Use up to four times daily as directed. (FOR ICD-10 E10.9, E11.9).    Blood Glucose Monitoring Suppl (ONETOUCH ULTRALINK) w/Device KIT Check blood sugars twice daily E11.9    carvedilol (COREG) 6.25 MG tablet Take 1 tablet (6.25 mg total) by mouth 2 (two) times daily with a meal.    diphenhydrAMINE (BENADRYL) 25 MG tablet Take 25 mg by mouth every 8 (eight) hours as needed for itching.    ethyl chloride spray Apply 1 application topically See admin instructions. Apply topically to dialysis site if there is no time to use emla cream    hydrALAZINE (APRESOLINE) 100 MG tablet Take 1 tablet (100 mg total) by mouth 3 (three) times daily. (Patient taking differently: Take 100 mg by mouth See admin instructions. Take one tablet (100 mg) by mouth three times daily - morning, dinner and bedtime)    Insulin Glargine (BASAGLAR KWIKPEN) 100 UNIT/ML Inject 20 Units into the skin daily as needed (only if blood sugar over 150).    Insulin Pen Needle 29G X 5MM MISC Use as directed    Lancets (ONETOUCH DELICA PLUS GSUPJS31R) MISC check blood sugars twice daily Dx code: E11.22    latanoprost (XALATAN) 0.005 % ophthalmic solution Place 1 drop into both eyes at bedtime.     levothyroxine (SYNTHROID) 175 MCG tablet Take 1 tablet (175 mcg total) by mouth daily before  breakfast.    lidocaine-prilocaine (EMLA) cream Apply 1 application topically See admin instructions. Apply to dialysis site 30 minutes before arriving at dialysis on Tuesday, Thursday, Saturday    loratadine (CLARITIN) 10 MG tablet Take 1 tablet (10 mg total) by mouth daily as needed for allergies.    Methoxy PEG-Epoetin Beta (MIRCERA IJ) Mircera 05/13/2021: At dialysis?   Multiple Vitamin (MULTIVITAMIN WITH MINERALS) TABS tablet Take 1 tablet by mouth every morning. Centrum Silver 50+    ONETOUCH ULTRA test strip CHECK BLOOD SUGARS TWICE DAILY    pantoprazole (PROTONIX) 40 MG tablet Take 1 tablet (40 mg total) by mouth daily.    polyethylene glycol (MIRALAX / GLYCOLAX) 17 g packet Take 17 g by mouth daily as needed for moderate constipation.    sildenafil (REVATIO) 20 MG tablet TAKE 1 TABLET BY MOUTH THREE TIMES DAILY (Patient taking differently: Take 20 mg by mouth See admin instructions. Take one tablet (20 mg) by mouth three times daily - morning, supper and bedtime)    sitaGLIPtin (JANUVIA) 25 MG tablet Take 1 tablet (25 mg total) by mouth daily.    No facility-administered encounter medications on file as of 07/11/2021.   07-11-2021: Called patient to reschedule telephone appointment with Orlando Penner CPP on 08-02-2020. Left patient a voicemail to reschedule. Appointment canceled.Patient called back to reschedule.  [facility] Pharmacist Assistant [phone]

## 2021-07-11 NOTE — Telephone Encounter (Signed)
PC to patient, informed her that her Hgb today is 10.0, per Dr. Alen Blew she does not need scheduled blood transfusion tomorrow.  Patient verbalizes understanding.

## 2021-07-12 ENCOUNTER — Inpatient Hospital Stay: Payer: Medicare Other

## 2021-07-12 ENCOUNTER — Telehealth (HOSPITAL_COMMUNITY): Payer: Self-pay | Admitting: Surgery

## 2021-07-12 NOTE — Telephone Encounter (Signed)
Information sent for patient to be charged for home sleep study device.  Patient has not completed the study nor returned the device after several reminder phone calls. °

## 2021-07-15 ENCOUNTER — Encounter: Payer: Self-pay | Admitting: Oncology

## 2021-07-17 ENCOUNTER — Other Ambulatory Visit: Payer: Self-pay | Admitting: *Deleted

## 2021-07-17 ENCOUNTER — Other Ambulatory Visit: Payer: Self-pay

## 2021-07-17 ENCOUNTER — Telehealth: Payer: Self-pay | Admitting: *Deleted

## 2021-07-17 ENCOUNTER — Inpatient Hospital Stay: Payer: Medicare PPO

## 2021-07-17 ENCOUNTER — Inpatient Hospital Stay: Payer: Medicare PPO | Attending: Oncology

## 2021-07-17 ENCOUNTER — Encounter: Payer: Self-pay | Admitting: Oncology

## 2021-07-17 DIAGNOSIS — D469 Myelodysplastic syndrome, unspecified: Secondary | ICD-10-CM

## 2021-07-17 DIAGNOSIS — D649 Anemia, unspecified: Secondary | ICD-10-CM

## 2021-07-17 DIAGNOSIS — D464 Refractory anemia, unspecified: Secondary | ICD-10-CM | POA: Diagnosis present

## 2021-07-17 DIAGNOSIS — Z79899 Other long term (current) drug therapy: Secondary | ICD-10-CM | POA: Insufficient documentation

## 2021-07-17 LAB — CBC WITH DIFFERENTIAL (CANCER CENTER ONLY)
Abs Immature Granulocytes: 0.04 10*3/uL (ref 0.00–0.07)
Basophils Absolute: 0 10*3/uL (ref 0.0–0.1)
Basophils Relative: 0 %
Eosinophils Absolute: 0.1 10*3/uL (ref 0.0–0.5)
Eosinophils Relative: 1 %
HCT: 25.7 % — ABNORMAL LOW (ref 36.0–46.0)
Hemoglobin: 8.6 g/dL — ABNORMAL LOW (ref 12.0–15.0)
Immature Granulocytes: 1 %
Lymphocytes Relative: 14 %
Lymphs Abs: 0.7 10*3/uL (ref 0.7–4.0)
MCH: 29.7 pg (ref 26.0–34.0)
MCHC: 33.5 g/dL (ref 30.0–36.0)
MCV: 88.6 fL (ref 80.0–100.0)
Monocytes Absolute: 0.8 10*3/uL (ref 0.1–1.0)
Monocytes Relative: 15 %
Neutro Abs: 3.5 10*3/uL (ref 1.7–7.7)
Neutrophils Relative %: 69 %
Platelet Count: 176 10*3/uL (ref 150–400)
RBC: 2.9 MIL/uL — ABNORMAL LOW (ref 3.87–5.11)
RDW: 19.5 % — ABNORMAL HIGH (ref 11.5–15.5)
WBC Count: 5.1 10*3/uL (ref 4.0–10.5)
nRBC: 0.4 % — ABNORMAL HIGH (ref 0.0–0.2)

## 2021-07-17 LAB — CMP (CANCER CENTER ONLY)
ALT: 27 U/L (ref 0–44)
AST: 45 U/L — ABNORMAL HIGH (ref 15–41)
Albumin: 4 g/dL (ref 3.5–5.0)
Alkaline Phosphatase: 125 U/L (ref 38–126)
Anion gap: 10 (ref 5–15)
BUN: 41 mg/dL — ABNORMAL HIGH (ref 8–23)
CO2: 30 mmol/L (ref 22–32)
Calcium: 9.8 mg/dL (ref 8.9–10.3)
Chloride: 98 mmol/L (ref 98–111)
Creatinine: 5.08 mg/dL (ref 0.44–1.00)
GFR, Estimated: 9 mL/min — ABNORMAL LOW (ref >60)
Glucose, Bld: 128 mg/dL — ABNORMAL HIGH (ref 70–99)
Potassium: 4 mmol/L (ref 3.5–5.1)
Sodium: 138 mmol/L (ref 135–145)
Total Bilirubin: 0.8 mg/dL (ref 0.3–1.2)
Total Protein: 7.3 g/dL (ref 6.5–8.1)

## 2021-07-17 LAB — SAMPLE TO BLOOD BANK

## 2021-07-17 LAB — PREPARE RBC (CROSSMATCH)

## 2021-07-17 MED ORDER — SODIUM CHLORIDE 0.9% IV SOLUTION
250.0000 mL | Freq: Once | INTRAVENOUS | Status: AC
  Administered 2021-07-17: 250 mL via INTRAVENOUS

## 2021-07-17 MED ORDER — DIPHENHYDRAMINE HCL 25 MG PO CAPS
50.0000 mg | ORAL_CAPSULE | Freq: Once | ORAL | Status: AC
  Administered 2021-07-17: 50 mg via ORAL
  Filled 2021-07-17: qty 2

## 2021-07-17 MED ORDER — ACETAMINOPHEN 325 MG PO TABS
650.0000 mg | ORAL_TABLET | Freq: Once | ORAL | Status: AC
  Administered 2021-07-17: 650 mg via ORAL
  Filled 2021-07-17: qty 2

## 2021-07-17 NOTE — Progress Notes (Signed)
Orders entered for 1 unit PRBCs - per Dr. Hazeline Junker standing protocol: She will receive 1 unit for hemoglobin between 8 and 9. Tylenol 650 mg and Benadryl 50mg   Orders for T&S and transfusion confirmed received with Beth in [facility]

## 2021-07-17 NOTE — Telephone Encounter (Signed)
CRITICAL VALUE STICKER  CRITICAL VALUE: Creatinine 5.08  RECEIVER (on-site recipient of call): Georgina Pillion, [place] NOTIFIED: 07/17/21; 1027  MESSENGER (representative from lab): Pam  MD NOTIFIED: Dr. Alen Blew  TIME OF NOTIFICATION: 1023  RESPONSE: Information acknowledged - no new orders received

## 2021-07-17 NOTE — Patient Instructions (Signed)
Blood Transfusion, Adult A blood transfusion is a procedure in which you receive blood or a type of blood cell (blood component) through an IV. You may need a blood transfusion when your blood level is low. This may result from a bleeding disorder, illness, injury, or surgery. The blood may come from a donor. You may also be able to donate blood for yourself (autologous blood donation) before a planned surgery. The blood given in a transfusion is made up of different blood components. You may receive: Red blood cells. These carry oxygen to the cells in the body. Platelets. These help your blood to clot. Plasma. This is the liquid part of your blood. It carries proteins and other substances throughout the body. White blood cells. These help you fight infections. If you have hemophilia or another clotting disorder, you may also receive other types of blood products. Tell a health care provider about: Any blood disorders you have. Any previous reactions you have had during a blood transfusion. Any allergies you have. All medicines you are taking, including vitamins, herbs, eye drops, creams, and over-the-counter medicines. Any surgeries you have had. Any medical conditions you have, including any recent fever or cold symptoms. Whether you are pregnant or may be pregnant. What are the risks? Generally, this is a safe procedure. However, problems may occur. The most common problems include: A mild allergic reaction, such as red, swollen areas of skin (hives) and itching. Fever or chills. This may be the body's response to new blood cells received. This may occur during or up to 4 hours after the transfusion. More serious problems may include: Transfusion-associated circulatory overload (TACO), or too much fluid in the lungs. This may cause breathing problems. A serious allergic reaction, such as difficulty breathing or swelling around the face and lips. Transfusion-related acute lung injury  (TRALI), which causes breathing difficulty and low oxygen in the blood. This can occur within hours of the transfusion or several days later. Iron overload. This can happen after receiving many blood transfusions over a period of time. Infection or virus being transmitted. This is rare because donated blood is carefully tested before it is given. Hemolytic transfusion reaction. This is rare. It happens when your body's defense system (immune system)tries to attack the new blood cells. Symptoms may include fever, chills, nausea, low blood pressure, and low back or chest pain. Transfusion-associated graft-versus-host disease (TAGVHD). This is rare. It happens when donated cells attack your body's healthy tissues. What happens before the procedure? Medicines Ask your health care provider about: Changing or stopping your regular medicines. This is especially important if you are taking diabetes medicines or blood thinners. Taking medicines such as aspirin and ibuprofen. These medicines can thin your blood. Do not take these medicines unless your health care provider tells you to take them. Taking over-the-counter medicines, vitamins, herbs, and supplements. General instructions Follow instructions from your health care provider about eating and drinking restrictions. You will have a blood test to determine your blood type. This is necessary to know what kind of blood your body will accept and to match it to the donor blood. If you are going to have a planned surgery, you may be able to do an autologous blood donation. This may be done in case you need to have a transfusion. You will have your temperature, blood pressure, and pulse monitored before the transfusion. If you have had an allergic reaction to a transfusion in the past, you may be given medicine to help prevent   a reaction. This medicine may be given to you by mouth (orally) or through an IV. Set aside time for the blood transfusion. This  procedure generally takes 1-4 hours to complete. What happens during the procedure?  An IV will be inserted into one of your veins. The bag of donated blood will be attached to your IV. The blood will then enter through your vein. Your temperature, blood pressure, and pulse will be monitored regularly during the transfusion. This monitoring is done to detect early signs of a transfusion reaction. Tell your nurse right away if you have any of these symptoms during the transfusion: Shortness of breath or trouble breathing. Chest or back pain. Fever or chills. Hives or itching. If you have any signs or symptoms of a reaction, your transfusion will be stopped and you may be given medicine. When the transfusion is complete, your IV will be removed. Pressure may be applied to the IV site for a few minutes. A bandage (dressing)will be applied. The procedure may vary among health care providers and hospitals. What happens after the procedure? Your temperature, blood pressure, pulse, breathing rate, and blood oxygen level will be monitored until you leave the hospital or clinic. Your blood may be tested to see how you are responding to the transfusion. You may be warmed with fluids or blankets to maintain a normal body temperature. If you receive your blood transfusion in an outpatient setting, you will be told whom to contact to report any reactions. Where to find more information For more information on blood transfusions, visit the [employer]: redcross.org Summary A blood transfusion is a procedure in which you receive blood or a type of blood cell (blood component) through an IV. The blood you receive may come from a donor or be donated by yourself (autologous blood donation) before a planned surgery. The blood given in a transfusion is made up of different blood components. You may receive red blood cells, platelets, plasma, or white blood cells depending on the condition treated. Your  temperature, blood pressure, and pulse will be monitored before, during, and after the transfusion. After the transfusion, your blood may be tested to see how your body has responded. This information is not intended to replace advice given to you by your health care provider. Make sure you discuss any questions you have with your health care provider. Document Revised: 05/06/2019 Document Reviewed: 12/24/2018 Elsevier Patient Education  2022 Elsevier Inc.  

## 2021-07-18 ENCOUNTER — Other Ambulatory Visit: Payer: Self-pay | Admitting: Nurse Practitioner

## 2021-07-18 LAB — TYPE AND SCREEN
ABO/RH(D): A NEG
Antibody Screen: POSITIVE
DAT, IgG: NEGATIVE
Unit division: 0

## 2021-07-18 LAB — BPAM RBC
Blood Product Expiration Date: 202301222359
ISSUE DATE / TIME: 202301031300
Unit Type and Rh: 600

## 2021-07-19 ENCOUNTER — Other Ambulatory Visit: Payer: Self-pay

## 2021-07-19 MED ORDER — ACCU-CHEK GUIDE VI STRP: ORAL_STRIP | 12 refills | Status: AC

## 2021-07-19 MED ORDER — ACCU-CHEK SOFTCLIX LANCETS MISC: 12 refills | Status: AC

## 2021-07-19 MED ORDER — ACCU-CHEK GUIDE ME W/DEVICE KIT: PACK | 3 refills | Status: AC

## 2021-07-20 ENCOUNTER — Telehealth: Payer: Self-pay

## 2021-07-20 NOTE — Chronic Care Management (AMB) (Signed)
Chronic Care Management Pharmacy Assistant   Name: Patricia Mata  MRN: [id number] DOB: 03/13/1956  Reason for Encounter: Disease State/ General  Recent office visits:  06-14-2021 Patricia Mata, Patricia Mata. A1C= 5.9. T3= 1.8, T4= 12.3. STOP tradjenta. START Januvia 25 mg daily.  Recent consult visits:  06-05-2021 Patricia [place], RN (Oncology). Infusion given.  06-12-2021 Patricia Hurdle, RN (Oncology). Infusion given.  06-19-2021 Patricia [place], RN (Oncology). Infusion given.  06-28-2021 Patricia Climes, RN (Oncology). Infusion given.  07-03-2021 Patricia Portela, MD (Oncology). Follow up. Infusion given.  07-17-2020 Patricia Ravel, RN  (Oncology). Infusion given.  Hospital visits:  Medication Reconciliation was completed by comparing discharge summary, patients EMR and Pharmacy list, and upon discussion with patient.  Admitted to the hospital on 05-24-2021 due to UGIB. Discharge date was 05-25-2021 Discharged from [place]?Medications Started at [facility] Discharge:?? Protonix 40 mg daily  Medication Changes at Hospital Discharge: None  Medications Discontinued at Hospital Discharge: Prilosec 40 mg daily  Medications that remain the same after Hospital Discharge:??  -All other medications will remain the same.   Hospital visits:  Medication Reconciliation was completed by comparing discharge summary, patients EMR and Pharmacy list, and upon discussion with patient.   Admitted to the hospital on 04-21-2021 due to atrial fibrillation. Discharge date was 04-23-2021 Discharged from [place]?Medications Started at [facility] Discharge:?? None   Medication Changes at Hospital Discharge: None   Medications Discontinued at Hospital Discharge: Clonidine   Medications that remain the same after Hospital Discharge:??  -All other medications will remain the same.    Medications: Outpatient Encounter Medications as of 07/20/2021   Medication Sig Note   Accu-Chek Softclix Lancets lancets Use to check blood sugar 4 times a day. Dx code E11.65    acetaminophen (TYLENOL) 500 MG tablet Take 1,000 mg by mouth every 6 (six) hours as needed (pain).    allopurinol (ZYLOPRIM) 300 MG tablet Take 300 mg by mouth every morning.    amiodarone (PACERONE) 200 MG tablet Take 1 tablet by mouth once daily    atorvastatin (LIPITOR) 40 MG tablet Take 1 tablet by mouth once daily    blood glucose meter kit and supplies Dispense based on patient and insurance preference. Use up to four times daily as directed. (FOR ICD-10 E10.9, E11.9).    Blood Glucose Monitoring Suppl (ACCU-CHEK GUIDE ME) w/Device KIT Use to check blood sugar 4 times a day. Dx code E11.65    carvedilol (COREG) 6.25 MG tablet TAKE 1 TABLET BY MOUTH TWICE DAILY WITH A MEAL    diphenhydrAMINE (BENADRYL) 25 MG tablet Take 25 mg by mouth every 8 (eight) hours as needed for itching.    ethyl chloride spray Apply 1 application topically See admin instructions. Apply topically to dialysis site if there is no time to use emla cream    glucose blood (ACCU-CHEK GUIDE) test strip Use to check blood sugar 4 times a day. Dx code E11.65    hydrALAZINE (APRESOLINE) 100 MG tablet Take 1 tablet (100 mg total) by mouth 3 (three) times daily. (Patient taking differently: Take 100 mg by mouth See admin instructions. Take one tablet (100 mg) by mouth three times daily - morning, dinner and bedtime)    Insulin Glargine (BASAGLAR KWIKPEN) 100 UNIT/ML Inject 20 Units into the skin daily as needed (only if blood sugar over 150).    Insulin Pen Needle 29G X 5MM MISC Use  as directed    latanoprost (XALATAN) 0.005 % ophthalmic solution Place 1 drop into both eyes at bedtime.     levothyroxine (SYNTHROID) 175 MCG tablet Take 1 tablet (175 mcg total) by mouth daily before breakfast.    lidocaine-prilocaine (EMLA) cream Apply 1 application topically See admin instructions. Apply to dialysis site 30 minutes  before arriving at dialysis on Tuesday, Thursday, Saturday    loratadine (CLARITIN) 10 MG tablet Take 1 tablet (10 mg total) by mouth daily as needed for allergies.    Methoxy PEG-Epoetin Beta (MIRCERA IJ) Mircera 05/13/2021: At dialysis?   Multiple Vitamin (MULTIVITAMIN WITH MINERALS) TABS tablet Take 1 tablet by mouth every morning. Centrum Silver 50+    ONETOUCH ULTRA test strip CHECK BLOOD SUGARS TWICE DAILY    pantoprazole (PROTONIX) 40 MG tablet Take 1 tablet (40 mg total) by mouth daily.    polyethylene glycol (MIRALAX / GLYCOLAX) 17 g packet Take 17 g by mouth daily as needed for moderate constipation.    sildenafil (REVATIO) 20 MG tablet TAKE 1 TABLET BY MOUTH THREE TIMES DAILY (Patient taking differently: Take 20 mg by mouth See admin instructions. Take one tablet (20 mg) by mouth three times daily - morning, supper and bedtime)    sitaGLIPtin (JANUVIA) 25 MG tablet Take 1 tablet (25 mg total) by mouth daily.    No facility-administered encounter medications on file as of 07/20/2021.   Recent Relevant Labs: Lab Results  Component Value Date/Time   HGBA1C 5.9 (H) 06/14/2021 05:01 PM   HGBA1C 6.5 (H) 05/13/2021 07:12 PM   MICROALBUR 80 06/22/2019 12:02 PM    Kidney Function Lab Results  Component Value Date/Time   CREATININE 5.08 (HH) 07/17/2021 09:45 AM   CREATININE 4.12 (HH) 06/28/2021 10:59 AM   CREATININE 2.24 (H) 01/27/2019 01:55 PM   CREATININE 2.48 (H) 09/08/2018 01:15 PM   CREATININE 1.4 (H) 05/26/2017 09:49 AM   CREATININE 1.7 (H) 05/09/2017 09:41 AM   GFRNONAA 9 (L) 07/17/2021 09:45 AM   GFRNONAA 22 (L) 01/27/2019 01:55 PM   GFRAA 16 (L) 09/05/2020 12:53 PM   GFRAA 20 (L) 04/07/2020 11:14 AM   GFRAA 26 (L) 01/27/2019 01:55 PM    Current antihyperglycemic regimen:  Tradjenta 5 mg daily Basaglar 20 units daily as needed if sugar is over 150.  What recent interventions/DTPs have been made to improve glycemic control:  Educated on Counseled to check feet daily and  get yearly eye exams -Counseled to check feet daily and get yearly eye exams  -Recommended to continue current medication   Have there been any recent hospitalizations or ED visits since last visit with CPP? Yes  Patient denies hypoglycemic symptoms Patient denies hyperglycemic symptoms How often are you checking your blood sugar? once daily  What are your blood sugars ranging?  Fasting: 135, 123, 114, 132, 102 Before meals: None After meals: None Bedtime: None  During the week, how often does your blood glucose drop below 70? Never  Are you checking your feet daily/regularly? Daily  Adherence Review: Is the patient currently on a STATIN medication? Yes Is the patient currently on ACE/ARB medication? No Does the patient have >5 day gap between last estimated fill dates? No  Reviewed chart prior to disease state call. Spoke with patient regarding BP  Recent Office Vitals: BP Readings from Last 3 Encounters:  07/17/21 (!) 162/50  07/03/21 (!) 170/49  07/03/21 (!) 153/39   Pulse Readings from Last 3 Encounters:  07/17/21 65  07/03/21 (!) 59  07/03/21 61    Wt Readings from Last 3 Encounters:  07/17/21 193 lb (87.5 kg)  07/03/21 197 lb 11.2 oz (89.7 kg)  06/26/21 196 lb 3.2 oz (89 kg)     Kidney Function Lab Results  Component Value Date/Time   CREATININE 5.08 (HH) 07/17/2021 09:45 AM   CREATININE 4.12 (HH) 06/28/2021 10:59 AM   CREATININE 2.24 (H) 01/27/2019 01:55 PM   CREATININE 2.48 (H) 09/08/2018 01:15 PM   CREATININE 1.4 (H) 05/26/2017 09:49 AM   CREATININE 1.7 (H) 05/09/2017 09:41 AM   GFRNONAA 9 (L) 07/17/2021 09:45 AM   GFRNONAA 22 (L) 01/27/2019 01:55 PM   GFRAA 16 (L) 09/05/2020 12:53 PM   GFRAA 20 (L) 04/07/2020 11:14 AM   GFRAA 26 (L) 01/27/2019 01:55 PM    BMP Latest Ref Rng & Units 07/17/2021 06/28/2021 06/05/2021  Glucose 70 - 99 mg/dL 128(H) 125(H) 107(H)  BUN 8 - 23 mg/dL 41(H) 38(H) 33(H)  Creatinine 0.44 - 1.00 mg/dL 5.08(HH) 4.12(HH)  3.91(HH)  BUN/Creat Ratio 12 - 28 - - -  Sodium 135 - 145 mmol/L 138 143 144  Potassium 3.5 - 5.1 mmol/L 4.0 3.9 3.5  Chloride 98 - 111 mmol/L 98 102 104  CO2 22 - 32 mmol/L '30 30 30  ' Calcium 8.9 - 10.3 mg/dL 9.8 9.2 9.2    Current antihypertensive regimen:  Hydralazine 100 MG - taking 1 tablet by mouth three times per day Revatio 20 mg- taking 1 tablet by mouth three times daily  Carvedilol 6.25 mg tablet two time per day Amiodarone 200 mg tablet once daily  How often are you checking your Blood Pressure? daily  Current home BP readings: 116/43  What recent interventions/DTPs have been made by any provider to improve Blood Pressure control since last CPP Visit:  Educated on Symptoms of hypotension and importance of maintaining adequate hydration; -Counseled to monitor BP at home at least 5 days per week, document, and provide log at future appointments -Recommended to continue current medication  Any recent hospitalizations or ED visits since last visit with CPP? Yes  What diet changes have been made to improve Blood Pressure Control?  Patient states she has limited her salt intake and drinks plenty of water daily.  What exercise is being done to improve your Blood Pressure Control?  Patient states she uses her stationary bike some days.  Adherence Review: Is the patient currently on ACE/ARB medication? No Does the patient have >5 day gap between last estimated fill dates? No  Care Gaps: Shingrix overdue PNA Vac overdue  Star Rating Drugs: Tradjenta 5 mg- Last filled 07-18-2021 30 DS [facility] Atorvastatin 40 mg- Last filled 05-28-2021 90 DS [place] Clinical Pharmacist Assistant [phone]

## 2021-07-21 ENCOUNTER — Other Ambulatory Visit (HOSPITAL_COMMUNITY): Payer: Self-pay | Admitting: Cardiovascular Disease

## 2021-07-24 ENCOUNTER — Other Ambulatory Visit: Payer: Self-pay

## 2021-07-24 ENCOUNTER — Inpatient Hospital Stay: Payer: Medicare PPO

## 2021-07-24 ENCOUNTER — Other Ambulatory Visit: Payer: Self-pay | Admitting: *Deleted

## 2021-07-24 VITALS — BP 155/49 | HR 65 | Temp 98.4°F | Resp 16

## 2021-07-24 DIAGNOSIS — D464 Refractory anemia, unspecified: Secondary | ICD-10-CM | POA: Diagnosis not present

## 2021-07-24 DIAGNOSIS — D649 Anemia, unspecified: Secondary | ICD-10-CM

## 2021-07-24 DIAGNOSIS — D469 Myelodysplastic syndrome, unspecified: Secondary | ICD-10-CM

## 2021-07-24 DIAGNOSIS — D500 Iron deficiency anemia secondary to blood loss (chronic): Secondary | ICD-10-CM

## 2021-07-24 LAB — CBC WITH DIFFERENTIAL (CANCER CENTER ONLY)
Abs Immature Granulocytes: 0.02 10*3/uL (ref 0.00–0.07)
Basophils Absolute: 0 10*3/uL (ref 0.0–0.1)
Basophils Relative: 1 %
Eosinophils Absolute: 0 10*3/uL (ref 0.0–0.5)
Eosinophils Relative: 1 %
HCT: 25.2 % — ABNORMAL LOW (ref 36.0–46.0)
Hemoglobin: 8.8 g/dL — ABNORMAL LOW (ref 12.0–15.0)
Immature Granulocytes: 1 %
Lymphocytes Relative: 14 %
Lymphs Abs: 0.6 10*3/uL — ABNORMAL LOW (ref 0.7–4.0)
MCH: 30.4 pg (ref 26.0–34.0)
MCHC: 34.9 g/dL (ref 30.0–36.0)
MCV: 87.2 fL (ref 80.0–100.0)
Monocytes Absolute: 0.7 10*3/uL (ref 0.1–1.0)
Monocytes Relative: 18 %
Neutro Abs: 2.6 10*3/uL (ref 1.7–7.7)
Neutrophils Relative %: 65 %
Platelet Count: 141 10*3/uL — ABNORMAL LOW (ref 150–400)
RBC: 2.89 MIL/uL — ABNORMAL LOW (ref 3.87–5.11)
RDW: 19.1 % — ABNORMAL HIGH (ref 11.5–15.5)
WBC Count: 3.9 10*3/uL — ABNORMAL LOW (ref 4.0–10.5)
nRBC: 0 % (ref 0.0–0.2)

## 2021-07-24 LAB — PREPARE RBC (CROSSMATCH)

## 2021-07-24 MED ORDER — DIPHENHYDRAMINE HCL 25 MG PO CAPS
50.0000 mg | ORAL_CAPSULE | Freq: Once | ORAL | Status: AC
  Administered 2021-07-24: 50 mg via ORAL
  Filled 2021-07-24: qty 2

## 2021-07-24 MED ORDER — ACETAMINOPHEN 325 MG PO TABS
650.0000 mg | ORAL_TABLET | Freq: Once | ORAL | Status: AC
  Administered 2021-07-24: 650 mg via ORAL
  Filled 2021-07-24: qty 2

## 2021-07-24 MED ORDER — SODIUM CHLORIDE 0.9% IV SOLUTION
250.0000 mL | Freq: Once | INTRAVENOUS | Status: AC
  Administered 2021-07-24: 250 mL via INTRAVENOUS

## 2021-07-24 MED ORDER — LUSPATERCEPT-AAMT 75 MG ~~LOC~~ SOLR
1.7500 mg/kg | Freq: Once | SUBCUTANEOUS | Status: AC
  Administered 2021-07-24: 150 mg via SUBCUTANEOUS
  Filled 2021-07-24: qty 3

## 2021-07-24 NOTE — Telephone Encounter (Signed)
Pt's pharmacy is requesting a refill on sildenafil. Would Dr. Cooper like to refill this medication? Please address 

## 2021-07-24 NOTE — Progress Notes (Signed)
Ok to use CMP from last week for today's treatment per Dr Alen Blew

## 2021-07-24 NOTE — Patient Instructions (Signed)
Blood Transfusion, Adult, Care After This sheet gives you information about how to care for yourself after your procedure. Your doctor may also give you more specific instructions. If you have problems or questions, contact your doctor. What can I expect after the procedure? After the procedure, it is common to have: Bruising and soreness at the IV site. A headache. Follow these instructions at home: Insertion site care   Follow instructions from your doctor about how to take care of your insertion site. This is where an IV tube was put into your vein. Make sure you: Wash your hands with soap and water before and after you change your bandage (dressing). If you cannot use soap and water, use hand sanitizer. Change your bandage as told by your doctor. Check your insertion site every day for signs of infection. Check for: Redness, swelling, or pain. Bleeding from the site. Warmth. Pus or a bad smell. General instructions Take over-the-counter and prescription medicines only as told by your doctor. Rest as told by your doctor. Go back to your normal activities as told by your doctor. Keep all follow-up visits as told by your doctor. This is important. Contact a doctor if: You have itching or red, swollen areas of skin (hives). You feel worried or nervous (anxious). You feel weak after doing your normal activities. You have redness, swelling, warmth, or pain around the insertion site. You have blood coming from the insertion site, and the blood does not stop with pressure. You have pus or a bad smell coming from the insertion site. Get help right away if: You have signs of a serious reaction. This may be coming from an allergy or the body's defense system (immune system). Signs include: Trouble breathing or shortness of breath. Swelling of the face or feeling warm (flushed). Fever or chills. Head, chest, or back pain. Dark pee (urine) or blood in the pee. Widespread rash. Fast  heartbeat. Feeling dizzy or light-headed. You may receive your blood transfusion in an outpatient setting. If so, you will be told whom to contact to report any reactions. These symptoms may be an emergency. Do not wait to see if the symptoms will go away. Get medical help right away. Call your local emergency services (911 in the [place] Do not drive yourself to the hospital. Summary Bruising and soreness at the IV site are common. Check your insertion site every day for signs of infection. Rest as told by your doctor. Go back to your normal activities as told by your doctor. Get help right away if you have signs of a serious reaction. This information is not intended to replace advice given to you by your health care provider. Make sure you discuss any questions you have with your health care provider. Document Revised: 10/26/2020 Document Reviewed: 12/24/2018 Elsevier Patient Education  2022 [employer] Inc.  

## 2021-07-25 LAB — TYPE AND SCREEN
ABO/RH(D): A NEG
Antibody Screen: POSITIVE
Unit division: 0

## 2021-07-25 LAB — BPAM RBC
Blood Product Expiration Date: 202301222359
ISSUE DATE / TIME: 202301101324
Unit Type and Rh: 600

## 2021-07-30 ENCOUNTER — Telehealth: Payer: Self-pay | Admitting: Oncology

## 2021-07-30 ENCOUNTER — Encounter: Payer: Self-pay | Admitting: Oncology

## 2021-07-30 LAB — SAMPLE TO BLOOD BANK

## 2021-07-30 NOTE — Telephone Encounter (Signed)
Sch per 1/13 inbasket, pt aware °

## 2021-07-31 ENCOUNTER — Ambulatory Visit: Payer: Medicare PPO | Admitting: Cardiovascular Disease

## 2021-07-31 ENCOUNTER — Encounter: Payer: Self-pay | Admitting: Cardiovascular Disease

## 2021-07-31 ENCOUNTER — Ambulatory Visit (HOSPITAL_COMMUNITY): Payer: Medicare PPO | Attending: Cardiovascular Disease

## 2021-07-31 ENCOUNTER — Other Ambulatory Visit: Payer: Self-pay

## 2021-07-31 ENCOUNTER — Other Ambulatory Visit: Payer: Medicare Other

## 2021-07-31 VITALS — BP 126/50 | HR 58 | Ht 64.0 in | Wt 198.8 lb

## 2021-07-31 DIAGNOSIS — I350 Nonrheumatic aortic (valve) stenosis: Secondary | ICD-10-CM

## 2021-07-31 LAB — ECHOCARDIOGRAM COMPLETE
AR max vel: 0.81 cm2
AV Area VTI: 0.84 cm2
AV Area mean vel: 0.75 cm2
AV Mean grad: 49 mmHg
AV Peak grad: 83.2 mmHg
Ao pk vel: 4.56 m/s
Area-P 1/2: 1.55 cm2
P 1/2 time: 407 msec
S' Lateral: 3.2 cm

## 2021-07-31 NOTE — Progress Notes (Signed)
Disregard - duplicate portion of note

## 2021-07-31 NOTE — Patient Instructions (Signed)
Medication Instructions:  NONE *If you need a refill on your cardiac medications before your next appointment, please call your pharmacy*   Lab Work: NONE If you have labs (blood work) drawn today and your tests are completely normal, you will receive your results only by: [place] (if you have MyChart) OR A paper copy in the mail If you have any lab test that is abnormal or we need to change your treatment, we will call you to review the results.   Testing/Procedures: ECHO in 1 year (same day as next scheduled appointment)   Follow-Up: At [facility], you and your health needs are our priority.  As part of our continuing mission to provide you with exceptional heart care, we have created designated Provider Care Teams.  These Care Teams include your primary Cardiologist (physician) and Advanced Practice Providers (APPs -  Physician Assistants and Nurse Practitioners) who all work together to provide you with the care you need, when you need it.   Your next appointment:   1 year(s)  The format for your next appointment:   In Person  Provider:   Sherren Mocha

## 2021-07-31 NOTE — Progress Notes (Signed)
Cardiology Office Note:    Date:  08/01/2021   ID:  [name], [name] [birth year], MRN [id number]  PCP:  [name], [place] [facility] Cardiologist:  Quay Burow, MD Advanced Heart Failure:  Glori Bickers, MD     Referring MD: [name], FNP   Chief Complaint  Patient presents with   Aortic Stenosis    History of Present Illness:    Patricia Mata is a 66 y.o. female with a hx of  multiple medical problems, presenting for follow-up of aortic stenosis.  The patient has type 2 diabetes, longstanding hypertension, end-stage renal disease on hemodialysis for the past year, colon cancer status post hemicolectomy and chemotherapy with resultant myelodysplastic syndrome and transfusion dependent anemia.  She has been hospitalized multiple times with severe symptomatic anemia, acute kidney injury, and acute heart failure.  In February of 2022 she was admitted with a hemoglobin of 4.1 and severe chest pain with elevated troponin.  She ultimately underwent cardiac catheterization demonstrating moderately severe left main disease with 60% stenosis in the mid shaft of the left mainstem.  Hemodynamics are consistent with moderate to severe aortic stenosis.  CT angiography studies demonstrated near occlusive aortic calcification and plaque which would be prohibitive for transfemoral access for TAVR.  Ongoing conservative management was recommended.  The patient is here with her husband today.  She comes in a wheelchair.  She remains remarkably resilient.  She states that she tolerates hemodialysis pretty well.  She has a blood draw once every week and is transfused as needed.  She has not had any problems with chest pain or pressure during hemodialysis.  She denies shortness of breath with her normal activities.  She states that a long long would definitely make her short of breath and fatigue.  She has no orthopnea, PND, or heart palpitations.  She states that her  volume has been managed well with hemodialysis.  She reportedly has been turned down for renal transplantation at one center, but she is being considered at [facility] and I have reached out to her for further evaluation.    Past Medical History:  Diagnosis Date   Acute renal failure (ARF) ([place]) 06/08/2018   TTHS- [place]   Carotid artery disease [facility])    Chronic diastolic (congestive) heart failure (HCC)    Colon cancer ([place]) 2008   Diabetes mellitus ([place])    Dyspnea    ESRD on hemodialysis ([place])    Gout 09/08/2018   Heart attack ([place])    Heart murmur    Hypertension    Hypothyroidism    Macrocytic anemia    MDS (myelodysplastic syndrome) (HCC)    Moderate mitral stenosis    PAD (peripheral artery disease) (HCC)    PAF (paroxysmal atrial fibrillation) (HCC)    Pulmonary hypertension (HCC)    Renal artery stenosis (HCC)    a. >60% R renal artery stenosis by duplex 2021, managed medically.   Severe aortic stenosis    Transfusion-dependent anemia     Past Surgical History:  Procedure Laterality Date   AV FISTULA PLACEMENT Right 09/26/2020   Procedure: RIGHT ARM ARTERIOVENOUS (AV) FISTULA CREATION;  Surgeon: Waynetta Sandy, MD;  Location: [place];  Service: Vascular;  Laterality: Right;  Monitor Anesthesia Care with Regional Block to Right Arm    [place] Right 12/13/2020   Procedure: RIGHT UPPER EXTREMITY ARTERIOVENOUS FISTULA TRANSPOSITION;  Surgeon: Waynetta Sandy, MD;  Location: [place];  Service: Vascular;  Laterality: Right;   COLON SURGERY     COLONOSCOPY WITH PROPOFOL N/A 02/22/2021   Procedure: COLONOSCOPY WITH PROPOFOL;  Surgeon: Otis Brace, MD;  Location: [place];  Service: Gastroenterology;  Laterality: N/A;   DEBRIDMENT OF DECUBITUS ULCER N/A 06/12/2018   Procedure: DEBRIDMENT OF DECUBITUS ULCER;  Surgeon: Wallace Going, DO;  Location: [facility];  Service: Plastics;  Laterality: N/A;   ENTEROSCOPY N/A  02/22/2021   Procedure: ENTEROSCOPY;  Surgeon: Otis Brace, MD;  Location: [facility];  Service: Gastroenterology;  Laterality: N/A;   ENTEROSCOPY N/A 05/24/2021   Procedure: ENTEROSCOPY;  Surgeon: Ronnette Juniper, MD;  Location: [place];  Service: Gastroenterology;  Laterality: N/A;   ESOPHAGOGASTRODUODENOSCOPY N/A 09/01/2020   Procedure: ESOPHAGOGASTRODUODENOSCOPY (EGD);  Surgeon: Clarene Essex, MD;  Location: [facility];  Service: Endoscopy;  Laterality: N/A;   GIVENS CAPSULE STUDY N/A 09/01/2020   Procedure: GIVENS CAPSULE STUDY;  Surgeon: Clarene Essex, MD;  Location: [facility];  Service: Endoscopy;  Laterality: N/A;   HEMOSTASIS CLIP PLACEMENT  02/22/2021   Procedure: HEMOSTASIS CLIP PLACEMENT;  Surgeon: Otis Brace, MD;  Location: [place];  Service: Gastroenterology;;   HOT HEMOSTASIS N/A 02/22/2021   Procedure: HOT HEMOSTASIS (ARGON PLASMA COAGULATION/BICAP);  Surgeon: Otis Brace, MD;  Location: [facility];  Service: Gastroenterology;  Laterality: N/A;   HOT HEMOSTASIS N/A 05/24/2021   Procedure: HOT HEMOSTASIS (ARGON PLASMA COAGULATION/BICAP);  Surgeon: Ronnette Juniper, MD;  Location: [place];  Service: Gastroenterology;  Laterality: N/A;   IR FLUORO GUIDE CV LINE RIGHT  10/11/2020   IR FLUORO GUIDE PORT INSERTION RIGHT  04/02/2017   IR REMOVAL TUN ACCESS W/ PORT W/O FL MOD SED  03/16/2019   IR US GUIDE VASC ACCESS RIGHT  04/02/2017   IR US GUIDE VASC ACCESS RIGHT  10/11/2020   LIGATION OF COMPETING BRANCHES OF ARTERIOVENOUS FISTULA Right 12/13/2020   Procedure: LIGATION OF COMPETING BRANCHES OF RIGHT UPPER EXTREMITY ARTERIOVENOUS FISTULA;  Surgeon: Waynetta Sandy, MD;  Location: [place];  Service: Vascular;  Laterality: Right;   POLYPECTOMY  02/22/2021   Procedure: POLYPECTOMY;  Surgeon: Otis Brace, MD;  Location: [place] [facility];  Service: Gastroenterology;;   RIGHT HEART CATH N/A 04/23/2019   Procedure: RIGHT HEART CATH;  Surgeon: Jolaine Artist,  MD;  Location: [place] [facility];  Service: Cardiovascular;  Laterality: N/A;   RIGHT/LEFT HEART CATH AND CORONARY ANGIOGRAPHY N/A 05/21/2018   Procedure: RIGHT/LEFT HEART CATH AND CORONARY ANGIOGRAPHY;  Surgeon: Nelva Bush, MD;  Location: [place] [facility];  Service: Cardiovascular;  Laterality: N/A;   RIGHT/LEFT HEART CATH AND CORONARY ANGIOGRAPHY N/A 10/13/2020   Procedure: RIGHT/LEFT HEART CATH AND CORONARY ANGIOGRAPHY;  Surgeon: Jolaine Artist, MD;  Location: [place] [facility];  Service: Cardiovascular;  Laterality: N/A;   TUBAL LIGATION      Current Medications: Current Meds  Medication Sig   Accu-Chek Softclix Lancets lancets Use to check blood sugar 4 times a day. Dx code E11.65   acetaminophen (TYLENOL) 500 MG tablet Take 1,000 mg by mouth every 6 (six) hours as needed (pain).   allopurinol (ZYLOPRIM) 300 MG tablet Take 300 mg by mouth every morning.   amiodarone (PACERONE) 200 MG tablet Take 1 tablet by mouth once daily   atorvastatin (LIPITOR) 40 MG tablet Take 1 tablet by mouth once daily   blood glucose meter kit and supplies Dispense based on patient and insurance preference. Use up to four times daily as directed. (FOR ICD-10 E10.9, E11.9).   Blood Glucose Monitoring Suppl (  ACCU-CHEK GUIDE ME) w/Device KIT Use to check blood sugar 4 times a day. Dx code E11.65   carvedilol (COREG) 6.25 MG tablet TAKE 1 TABLET BY MOUTH TWICE DAILY WITH A MEAL   colchicine 0.6 MG tablet 1 tablet as needed   diphenhydrAMINE (BENADRYL) 25 MG tablet Take 25 mg by mouth every 8 (eight) hours as needed for itching.   ethyl chloride spray Apply 1 application topically See admin instructions. Apply topically to dialysis site if there is no time to use emla cream   glucose blood (ACCU-CHEK GUIDE) test strip Use to check blood sugar 4 times a day. Dx code E11.65   hydrALAZINE (APRESOLINE) 100 MG tablet Take 1 tablet (100 mg total) by mouth 3 (three) times daily. (Patient taking differently: Take  100 mg by mouth See admin instructions. Take one tablet (100 mg) by mouth three times daily - morning, dinner and bedtime)   Insulin Glargine (BASAGLAR KWIKPEN) 100 UNIT/ML Inject 20 Units into the skin daily as needed (only if blood sugar over 150).   Insulin Pen Needle 29G X 5MM MISC Use as directed   latanoprost (XALATAN) 0.005 % ophthalmic solution Place 1 drop into both eyes at bedtime.    levothyroxine (SYNTHROID) 175 MCG tablet Take 1 tablet (175 mcg total) by mouth daily before breakfast.   lidocaine-prilocaine (EMLA) cream Apply 1 application topically See admin instructions. Apply to dialysis site 30 minutes before arriving at dialysis on Tuesday, Thursday, Saturday   LOPERAMIDE HCL PO Take by mouth.   loratadine (CLARITIN) 10 MG tablet Take 1 tablet (10 mg total) by mouth daily as needed for allergies.   Methoxy PEG-Epoetin Beta (MIRCERA IJ) Mircera   Multiple Vitamin (MULTIVITAMIN WITH MINERALS) TABS tablet Take 1 tablet by mouth every morning. Centrum Silver 50+   ONETOUCH ULTRA test strip CHECK BLOOD SUGARS TWICE DAILY   pantoprazole (PROTONIX) 40 MG tablet Take 1 tablet (40 mg total) by mouth daily.   polyethylene glycol (MIRALAX / GLYCOLAX) 17 g packet Take 17 g by mouth daily as needed for moderate constipation.   sildenafil (REVATIO) 20 MG tablet TAKE 1 TABLET BY MOUTH THREE TIMES DAILY   TRADJENTA 5 MG TABS tablet Take 5 mg by mouth daily.     Allergies:   Lactose intolerance (gi), Morphine and related, and Sulfa antibiotics   Social History   Socioeconomic History   Marital status: Married    Spouse name: Not on file   Number of children: Not on file   Years of education: Not on file   Highest education level: Not on file  Occupational History   Occupation: retired  Tobacco Use   Smoking status: Former    Packs/day: 0.25    Years: 20.00    Pack years: 5.00    Types: Cigarettes    Quit date: 1997    Years since quitting: 26.0   Smokeless tobacco: Former   Scientific laboratory technician Use: Never used  Substance and Sexual Activity   Alcohol use: Yes    Alcohol/week: 1.0 standard drink    Types: 1 Standard drinks or equivalent per week    Comment: occ   Drug use: Never   Sexual activity: Not on file  Other Topics Concern   Not on file  Social History Narrative   Not on file   Social Determinants of Health   Financial Resource Strain: Low Risk    Difficulty of Paying Living Expenses: Not hard at all  Food Insecurity: No  Food Insecurity   Worried About Charity fundraiser in the Last Year: Never true   Ran Out of Food in the Last Year: Never true  Transportation Needs: No Transportation Needs   Lack of Transportation (Medical): No   Lack of Transportation (Non-Medical): No  Physical Activity: Inactive   Days of Exercise per Week: 0 days   Minutes of Exercise per Session: 0 min  Stress: No Stress Concern Present   Feeling of Stress : Not at all  Social Connections: Not on file     Family History: The patient's family history includes Cervical cancer in her mother; Diabetes in her mother; HIV/AIDS in her brother; Heart attack in her father; Hypertension in her brother, mother, sister, sister, and sister; Prostate cancer in her brother.  ROS:   Please see the history of present illness.    All other systems reviewed and are negative.  EKGs/Labs/Other Studies Reviewed:    The following studies were reviewed today: Today's 2D echocardiogram is personally reviewed.  The formal interpretation is pending.  She appears to have normal LV systolic function with no regional wall motion abnormalities.  Peak and mean transaortic gradients are 61 and 38 mmHg, respectively.  The dimensionless index is 0.27.  There is mild AI present.  The calculated aortic valve area 0.67 cm.  EKG:  EKG is not ordered today.    Recent Labs: 11/21/2020: B Natriuretic Peptide 1,054.4 06/14/2021: Magnesium 2.0; TSH 1.990 07/17/2021: ALT 27; BUN 41; Creatinine 5.08;  Potassium 4.0; Sodium 138 07/24/2021: Hemoglobin 8.8; Platelet Count 141  Recent Lipid Panel    Component Value Date/Time   CHOL 176 11/21/2020 1302   CHOL 168 08/10/2020 1601   TRIG 85 11/21/2020 1302   HDL 88 11/21/2020 1302   HDL 58 08/10/2020 1601   CHOLHDL 2.0 11/21/2020 1302   VLDL 17 11/21/2020 1302   LDLCALC 71 11/21/2020 1302   LDLCALC 92 08/10/2020 1601     Risk Assessment/Calculations:    CHA2DS2-VASc Score = 8   This indicates a 10.8% annual risk of stroke. The patient's score is based upon: CHF History: 1 HTN History: 1 Diabetes History: 1 Stroke History: 2 Vascular Disease History: 1 Age Score: 1 Gender Score: 1          Physical Exam:    VS:  BP (!) 126/50    Pulse (!) 58    Ht '5\' 4"'  (1.626 m)    Wt 198 lb 12.8 oz (90.2 kg)    SpO2 98%    BMI 34.12 kg/m     Wt Readings from Last 3 Encounters:  07/31/21 198 lb 12.8 oz (90.2 kg)  07/17/21 193 lb (87.5 kg)  07/03/21 197 lb 11.2 oz (89.7 kg)     GEN:  Well nourished, well developed in no acute distress HEENT: Normal NECK: No JVD; No carotid bruits LYMPHATICS: No lymphadenopathy CARDIAC: RRR, 3/6 harsh crescendo decrescendo murmur at the right upper sternal border, absent A2 RESPIRATORY:  Clear to auscultation without rales, wheezing or rhonchi  ABDOMEN: Soft, non-tender, non-distended MUSCULOSKELETAL:  No edema; No deformity  SKIN: Warm and dry NEUROLOGIC:  Alert and oriented x 3 PSYCHIATRIC:  Normal affect   ASSESSMENT:    1. Severe aortic stenosis    PLAN:    In order of problems listed above:  The patient appears clinically stable.  Her echocardiogram demonstrates severe but stable aortic stenosis.  The aortic valve leaflets are heavily calcified and restricted.  With a mean gradient of  38 mmHg and dimensionless index of 0.27, she appears to have severe but not critical aortic stenosis.  She really does not have any symptoms attributable to this.  Unfortunately, I think her comorbid  medical conditions remain prohibitive for consideration of TAVR.  In addition to the left main coronary artery disease, end-stage renal disease, and transfusion dependent myelodysplastic syndrome, she does not have transfemoral access for TAVR.  The patient is comfortable with ongoing observation.  I will see her back in 1 year for follow-up evaluation.  We discussed her evaluation for consideration of kidney transplantation.  I would be very surprised if she is a candidate for renal transplantation with her cardiac comorbidities.  I think she would be at very high risk of surgery.  I will forward my note to Dr. Haroldine Laws and Dr. Carolin Sicks for their review.           Medication Adjustments/Labs and Tests Ordered: Current medicines are reviewed at length with the patient today.  Concerns regarding medicines are outlined above.  Orders Placed This Encounter  Procedures   ECHOCARDIOGRAM COMPLETE   No orders of the defined types were placed in this encounter.   Patient Instructions  Medication Instructions:  NONE *If you need a refill on your cardiac medications before your next appointment, please call your pharmacy*   Lab Work: NONE If you have labs (blood work) drawn today and your tests are completely normal, you will receive your results only by: [place] (if you have MyChart) OR A paper copy in the mail If you have any lab test that is abnormal or we need to change your treatment, we will call you to review the results.   Testing/Procedures: ECHO in 1 year (same day as next scheduled appointment)   Follow-Up: At [facility], you and your health needs are our priority.  As part of our continuing mission to provide you with exceptional heart care, we have created designated Provider Care Teams.  These Care Teams include your primary Cardiologist (physician) and Advanced Practice Providers (APPs -  Physician Assistants and Nurse Practitioners) who all work together to  provide you with the care you need, when you need it.   Your next appointment:   1 year(s)  The format for your next appointment:   In Person  Provider:   Joetta Manners, MD  08/01/2021 7:39 AM    [place]

## 2021-08-01 ENCOUNTER — Encounter: Payer: Self-pay | Admitting: Cardiovascular Disease

## 2021-08-02 ENCOUNTER — Telehealth: Payer: BC Managed Care – PPO

## 2021-08-02 ENCOUNTER — Telehealth: Payer: Self-pay | Admitting: *Deleted

## 2021-08-02 ENCOUNTER — Other Ambulatory Visit: Payer: Self-pay

## 2021-08-02 ENCOUNTER — Telehealth: Payer: Self-pay

## 2021-08-02 ENCOUNTER — Inpatient Hospital Stay: Payer: Medicare PPO

## 2021-08-02 DIAGNOSIS — D469 Myelodysplastic syndrome, unspecified: Secondary | ICD-10-CM

## 2021-08-02 DIAGNOSIS — D464 Refractory anemia, unspecified: Secondary | ICD-10-CM | POA: Diagnosis not present

## 2021-08-02 DIAGNOSIS — D649 Anemia, unspecified: Secondary | ICD-10-CM

## 2021-08-02 LAB — CBC WITH DIFFERENTIAL (CANCER CENTER ONLY)
Abs Immature Granulocytes: 0.04 K/uL (ref 0.00–0.07)
Basophils Absolute: 0 K/uL (ref 0.0–0.1)
Basophils Relative: 0 %
Eosinophils Absolute: 0 K/uL (ref 0.0–0.5)
Eosinophils Relative: 1 %
HCT: 26.8 % — ABNORMAL LOW (ref 36.0–46.0)
Hemoglobin: 9.1 g/dL — ABNORMAL LOW (ref 12.0–15.0)
Immature Granulocytes: 1 %
Lymphocytes Relative: 12 %
Lymphs Abs: 0.7 K/uL (ref 0.7–4.0)
MCH: 30.6 pg (ref 26.0–34.0)
MCHC: 34 g/dL (ref 30.0–36.0)
MCV: 90.2 fL (ref 80.0–100.0)
Monocytes Absolute: 1 K/uL (ref 0.1–1.0)
Monocytes Relative: 17 %
Neutro Abs: 4 K/uL (ref 1.7–7.7)
Neutrophils Relative %: 69 %
Platelet Count: 156 K/uL (ref 150–400)
RBC: 2.97 MIL/uL — ABNORMAL LOW (ref 3.87–5.11)
RDW: 19 % — ABNORMAL HIGH (ref 11.5–15.5)
WBC Count: 5.8 K/uL (ref 4.0–10.5)
nRBC: 0.7 % — ABNORMAL HIGH (ref 0.0–0.2)

## 2021-08-02 LAB — SAMPLE TO BLOOD BANK

## 2021-08-02 LAB — CMP (CANCER CENTER ONLY)
ALT: 19 U/L (ref 0–44)
AST: 31 U/L (ref 15–41)
Albumin: 4.2 g/dL (ref 3.5–5.0)
Alkaline Phosphatase: 138 U/L — ABNORMAL HIGH (ref 38–126)
Anion gap: 12 (ref 5–15)
BUN: 49 mg/dL — ABNORMAL HIGH (ref 8–23)
CO2: 31 mmol/L (ref 22–32)
Calcium: 10.1 mg/dL (ref 8.9–10.3)
Chloride: 97 mmol/L — ABNORMAL LOW (ref 98–111)
Creatinine: 5.05 mg/dL (ref 0.44–1.00)
GFR, Estimated: 9 mL/min — ABNORMAL LOW
Glucose, Bld: 226 mg/dL — ABNORMAL HIGH (ref 70–99)
Potassium: 4 mmol/L (ref 3.5–5.1)
Sodium: 140 mmol/L (ref 135–145)
Total Bilirubin: 1.1 mg/dL (ref 0.3–1.2)
Total Protein: 7.5 g/dL (ref 6.5–8.1)

## 2021-08-02 NOTE — Telephone Encounter (Signed)
CRITICAL VALUE STICKER  CRITICAL VALUE: Creatnine 5.05  RECEIVER (on-site recipient of call): H. Breeding LPN  DATE & TIME NOTIFIED: 08/02/21 @ 1136  MESSENGER (representative from lab): Rosann Auerbach  MD NOTIFIED: Alen Blew  TIME OF NOTIFICATION:1142  RESPONSE:  MD aware

## 2021-08-02 NOTE — Telephone Encounter (Signed)
CRITICAL VALUE STICKER  CRITICAL VALUE:  creatinine 5.05  RECEIVER (on-site recipient of call):  Rondel Baton, LPN  [place] NOTIFIED: 08/02/21    11:36  MESSENGER (representative from lab):  Rosann Auerbach  MD NOTIFIED: Alen Blew  TIME OF NOTIFICATION:  11:40  RESPONSE:

## 2021-08-06 ENCOUNTER — Ambulatory Visit: Payer: Medicare PPO | Admitting: Nurse Practitioner

## 2021-08-07 ENCOUNTER — Ambulatory Visit: Payer: Medicare PPO | Admitting: Nurse Practitioner

## 2021-08-07 ENCOUNTER — Other Ambulatory Visit: Payer: Self-pay

## 2021-08-07 ENCOUNTER — Encounter: Payer: Self-pay | Admitting: Nurse Practitioner

## 2021-08-07 ENCOUNTER — Inpatient Hospital Stay: Payer: Medicare PPO

## 2021-08-07 ENCOUNTER — Other Ambulatory Visit: Payer: Self-pay | Admitting: *Deleted

## 2021-08-07 VITALS — BP 124/70 | HR 66 | Temp 98.1°F | Ht 64.0 in | Wt 197.6 lb

## 2021-08-07 DIAGNOSIS — D500 Iron deficiency anemia secondary to blood loss (chronic): Secondary | ICD-10-CM

## 2021-08-07 DIAGNOSIS — E039 Hypothyroidism, unspecified: Secondary | ICD-10-CM

## 2021-08-07 DIAGNOSIS — Z23 Encounter for immunization: Secondary | ICD-10-CM

## 2021-08-07 DIAGNOSIS — D469 Myelodysplastic syndrome, unspecified: Secondary | ICD-10-CM

## 2021-08-07 DIAGNOSIS — R21 Rash and other nonspecific skin eruption: Secondary | ICD-10-CM

## 2021-08-07 DIAGNOSIS — E1122 Type 2 diabetes mellitus with diabetic chronic kidney disease: Secondary | ICD-10-CM

## 2021-08-07 DIAGNOSIS — N186 End stage renal disease: Secondary | ICD-10-CM

## 2021-08-07 DIAGNOSIS — D649 Anemia, unspecified: Secondary | ICD-10-CM

## 2021-08-07 DIAGNOSIS — D464 Refractory anemia, unspecified: Secondary | ICD-10-CM | POA: Diagnosis not present

## 2021-08-07 DIAGNOSIS — Z6833 Body mass index (BMI) 33.0-33.9, adult: Secondary | ICD-10-CM

## 2021-08-07 DIAGNOSIS — Z992 Dependence on renal dialysis: Secondary | ICD-10-CM

## 2021-08-07 DIAGNOSIS — E6609 Other obesity due to excess calories: Secondary | ICD-10-CM | POA: Diagnosis not present

## 2021-08-07 DIAGNOSIS — Z794 Long term (current) use of insulin: Secondary | ICD-10-CM

## 2021-08-07 LAB — CBC WITH DIFFERENTIAL (CANCER CENTER ONLY)
Abs Immature Granulocytes: 0.05 10*3/uL (ref 0.00–0.07)
Basophils Absolute: 0 10*3/uL (ref 0.0–0.1)
Basophils Relative: 0 %
Eosinophils Absolute: 0 10*3/uL (ref 0.0–0.5)
Eosinophils Relative: 1 %
HCT: 25.4 % — ABNORMAL LOW (ref 36.0–46.0)
Hemoglobin: 8.4 g/dL — ABNORMAL LOW (ref 12.0–15.0)
Immature Granulocytes: 1 %
Lymphocytes Relative: 14 %
Lymphs Abs: 0.9 10*3/uL (ref 0.7–4.0)
MCH: 30 pg (ref 26.0–34.0)
MCHC: 33.1 g/dL (ref 30.0–36.0)
MCV: 90.7 fL (ref 80.0–100.0)
Monocytes Absolute: 0.9 10*3/uL (ref 0.1–1.0)
Monocytes Relative: 15 %
Neutro Abs: 4.3 10*3/uL (ref 1.7–7.7)
Neutrophils Relative %: 69 %
Platelet Count: 185 10*3/uL (ref 150–400)
RBC: 2.8 MIL/uL — ABNORMAL LOW (ref 3.87–5.11)
RDW: 19.5 % — ABNORMAL HIGH (ref 11.5–15.5)
WBC Count: 6.2 10*3/uL (ref 4.0–10.5)
nRBC: 0.8 % — ABNORMAL HIGH (ref 0.0–0.2)

## 2021-08-07 LAB — PREPARE RBC (CROSSMATCH)

## 2021-08-07 LAB — SAMPLE TO BLOOD BANK

## 2021-08-07 MED ORDER — SODIUM CHLORIDE 0.9% IV SOLUTION
250.0000 mL | Freq: Once | INTRAVENOUS | Status: AC
  Administered 2021-08-07: 13:00:00 250 mL via INTRAVENOUS

## 2021-08-07 MED ORDER — ACETAMINOPHEN 325 MG PO TABS
650.0000 mg | ORAL_TABLET | Freq: Once | ORAL | Status: AC
  Administered 2021-08-07: 13:00:00 650 mg via ORAL
  Filled 2021-08-07: qty 2

## 2021-08-07 MED ORDER — SHINGRIX 50 MCG/0.5ML IM SUSR: 0.5000 mL | Freq: Once | INTRAMUSCULAR | 0 refills | Status: AC

## 2021-08-07 MED ORDER — DIPHENHYDRAMINE HCL 25 MG PO CAPS
50.0000 mg | ORAL_CAPSULE | Freq: Once | ORAL | Status: AC
  Administered 2021-08-07: 13:00:00 50 mg via ORAL
  Filled 2021-08-07: qty 2

## 2021-08-07 NOTE — Progress Notes (Signed)
I,Patricia Mata,acting as a Education administrator for Patricia Brine, FNP.,have documented all relevant documentation on the behalf of Patricia Brine, FNP,as directed by  Patricia Brine, FNP while in the presence of Patricia Mata, [place].  This visit occurred during the SARS-CoV-2 public health emergency.  Safety protocols were in place, including screening questions prior to the visit, additional usage of staff PPE, and extensive cleaning of exam room while observing appropriate contact time as indicated for disinfecting solutions.  Subjective:     Patient ID: Patricia Mata , female    DOB: 06/17/1956 , 66 y.o.   MRN: [id number]   Chief Complaint  Patient presents with   Hypothyroidism   Diabetes   Hypertension    HPI  Pt presents today for thyroid & DM f/u. Pt would like to get pneumonia & shingles vaccine.  She is getting her Tradjenta from [facility], she is being charged $30-45 per month. She is planning to switch back to [facility] and [facility]. She is now on [employer] from [facility] (70/30).   She is going to the cancer center once a week for labs to check her Hgb, she is also continue dialysis MWF - overall doing well.     Diabetes She presents for her follow-up diabetic visit. She has type 2 diabetes mellitus. Pertinent negatives for hypoglycemia include no dizziness or headaches. Pertinent negatives for diabetes include no chest pain, no fatigue, no polydipsia, no polyphagia and no polyuria. There are no hypoglycemic complications. There are no diabetic complications. Current diabetic treatment includes oral agent (dual therapy) and insulin injections. She is compliant with treatment all of the time. She is following a generally healthy diet. There is no change in her home blood glucose trend. (Blood sugars 120-130 occasionally will have a 150. She will use Basaglar when over 150. ) 7   Past Medical History:  Diagnosis Date   Acute renal failure (ARF) ([place])  06/08/2018   TTHS- [facility]   Carotid artery disease ([place])    Chronic diastolic (congestive) heart failure (HCC)    Colon cancer ([place]) 2008   Diabetes mellitus ([place])    Dyspnea    ESRD on hemodialysis ([place])    Gout 09/08/2018   Heart attack ([place])    Heart murmur    Hypertension    Hypothyroidism    Macrocytic anemia    MDS (myelodysplastic syndrome) (HCC)    Moderate mitral stenosis    PAD (peripheral artery disease) (HCC)    PAF (paroxysmal atrial fibrillation) (HCC)    Pulmonary hypertension (HCC)    Renal artery stenosis (HCC)    a. >60% R renal artery stenosis by duplex 2021, managed medically.   Severe aortic stenosis    Transfusion-dependent anemia      Family History  Problem Relation Age of Onset   Hypertension Mother    Diabetes Mother    Cervical cancer Mother    Heart attack Father    Hypertension Sister    Hypertension Brother    Hypertension Sister    Hypertension Sister    Prostate cancer Brother    HIV/AIDS Brother      Current Outpatient Medications:    Accu-Chek Softclix Lancets lancets, Use to check blood sugar 4 times a day. Dx code E11.65 (Patient taking differently: daily.), Disp: 400 each, Rfl: 12   acetaminophen (TYLENOL) 500 MG tablet, Take 1,000 mg by mouth every 6 (six) hours as needed (pain)., Disp: , Rfl:    allopurinol (ZYLOPRIM)  300 MG tablet, Take 300 mg by mouth every morning., Disp: , Rfl:    amiodarone (PACERONE) 200 MG tablet, Take 1 tablet by mouth once daily (Patient taking differently: Take 200 mg by mouth daily. Take 1 tablet by mouth once daily), Disp: 90 tablet, Rfl: 1   blood glucose meter kit and supplies, Dispense based on patient and insurance preference. Use up to four times daily as directed. (FOR ICD-10 E10.9, E11.9). (Patient taking differently: daily.), Disp: 1 each, Rfl: 3   Blood Glucose Monitoring Suppl (ACCU-CHEK GUIDE ME) w/Device KIT, Use to check blood sugar 4 times a day. Dx code E11.65, Disp: 1 kit, Rfl: 3    carvedilol (COREG) 6.25 MG tablet, TAKE 1 TABLET BY MOUTH TWICE DAILY WITH A MEAL (Patient taking differently: Take 6.25 mg by mouth 2 (two) times daily with a meal.), Disp: 180 tablet, Rfl: 1   colchicine 0.6 MG tablet, Take 0.6 mg by mouth daily as needed (gout flares)., Disp: , Rfl:    diphenhydrAMINE (BENADRYL) 25 MG tablet, Take 25 mg by mouth every 8 (eight) hours as needed for itching., Disp: , Rfl:    ethyl chloride spray, Apply 1 application topically See admin instructions. Apply topically to dialysis site if there is no time to use emla cream, Disp: , Rfl:    Insulin Glargine (BASAGLAR KWIKPEN) 100 UNIT/ML, Inject 20 Units into the skin daily as needed (only if blood sugar over 150)., Disp: , Rfl:    Insulin Pen Needle 29G X 5MM MISC, Use as directed, Disp: 200 each, Rfl: 0   latanoprost (XALATAN) 0.005 % ophthalmic solution, Place 1 drop into both eyes at bedtime. , Disp: , Rfl:    levothyroxine (SYNTHROID) 175 MCG tablet, Take 1 tablet (175 mcg total) by mouth daily before breakfast., Disp: 30 tablet, Rfl: 2   lidocaine-prilocaine (EMLA) cream, Apply 1 application topically See admin instructions. Apply to dialysis site 30 minutes before arriving at dialysis on  Monday,Wednesday,friday, Disp: , Rfl:    loratadine (CLARITIN) 10 MG tablet, Take 1 tablet (10 mg total) by mouth daily as needed for allergies., Disp: 90 tablet, Rfl: 2   Methoxy PEG-Epoetin Beta (MIRCERA IJ), Mircera, Disp: , Rfl:    Multiple Vitamin (MULTIVITAMIN WITH MINERALS) TABS tablet, Take 1 tablet by mouth every morning. Centrum Silver 50+, Disp: , Rfl:    ONETOUCH ULTRA test strip, CHECK BLOOD SUGARS TWICE DAILY, Disp: 100 each, Rfl: 11   pantoprazole (PROTONIX) 40 MG tablet, Take 1 tablet (40 mg total) by mouth daily., Disp: 90 tablet, Rfl: 6   polyethylene glycol (MIRALAX / GLYCOLAX) 17 g packet, Take 17 g by mouth daily as needed for moderate constipation., Disp: , Rfl:    sildenafil (REVATIO) 20 MG tablet, TAKE 1  TABLET BY MOUTH THREE TIMES DAILY (Patient taking differently: Take 20 mg by mouth 3 (three) times daily.), Disp: 90 tablet, Rfl: 3   TRADJENTA 5 MG TABS tablet, Take 5 mg by mouth daily., Disp: , Rfl:    atorvastatin (LIPITOR) 40 MG tablet, Take 1 tablet by mouth once daily (Patient taking differently: Take 40 mg by mouth daily.), Disp: 90 tablet, Rfl: 0   glucose blood (ACCU-CHEK GUIDE) test strip, Use to check blood sugar 4 times a day. Dx code E11.65 (Patient taking differently: daily.), Disp: 400 each, Rfl: 12   midodrine (PROAMATINE) 10 MG tablet, Take 10 mg by mouth See admin instructions. Dialysis days Monday,Wednesday,friday, Disp: , Rfl:    oxyCODONE-acetaminophen (PERCOCET) 5-325 MG tablet,  Take 1 tablet by mouth every 4 (four) hours as needed., Disp: 10 tablet, Rfl: 0   Allergies  Allergen Reactions   Lactose Intolerance (Gi) Diarrhea   Morphine And Related Itching   Sulfa Antibiotics Rash and Other (See Comments)    Blisters, also     Review of Systems  Constitutional: Negative.  Negative for fatigue.  Respiratory: Negative.    Cardiovascular: Negative.  Negative for chest pain, palpitations and leg swelling.  Endocrine: Negative for polydipsia, polyphagia and polyuria.  Genitourinary:        Buttocks has been sore and reddened. Denies pain and wants [place] to look at it to make sure healing well.   Neurological: Negative.  Negative for dizziness and headaches.  Psychiatric/Behavioral: Negative.      Today's Vitals   08/07/21 0914  BP: 124/70  Pulse: 66  Temp: 98.1 F (36.7 C)  Weight: 197 lb 9.6 oz (89.6 kg)  Height: _0  (1.626 m)   Body mass index is 33.92 kg/m.  Wt Readings from Last 3 Encounters:  09/11/21 197 lb 5 oz (89.5 kg)  08/30/21 195 lb 9.6 oz (88.7 kg)  08/07/21 197 lb 9.6 oz (89.6 kg)    Objective:  Physical Exam Vitals reviewed.  Constitutional:      General: She is not in acute distress.    Appearance: Normal appearance. She is obese.   Cardiovascular:     Rate and Rhythm: Normal rate and regular rhythm.     Pulses: Normal pulses.     Heart sounds: Murmur heard.  Pulmonary:     Effort: Pulmonary effort is normal. No respiratory distress.     Breath sounds: Normal breath sounds. No wheezing.  Genitourinary:    Labia:        Right: No rash, tenderness, lesion or injury.        Left: Tenderness and lesion present. No rash or injury.   Skin:    General: Skin is warm and dry.     Findings: Erythema present.     Comments: Healed buttocks wound  Neurological:     General: No focal deficit present.     Mental Status: She is alert and oriented to person, place, and time.     Cranial Nerves: No cranial nerve deficit.     Motor: No weakness.  Psychiatric:        Mood and Affect: Mood normal.        Behavior: Behavior normal.        Thought Content: Thought content normal.        Judgment: Judgment normal.        Assessment And Plan:     1. Acquired hypothyroidism Comments: Stable continue medications - TSH - T4 - T3, free  2. Type 2 diabetes mellitus with chronic kidney disease on chronic dialysis, with long-term current use of insulin (HCC) Comments: Improving, continue current medications  3. Class 1 obesity due to excess calories without serious comorbidity with body mass index (BMI) of 33.0 to 33.9 in adult  She is encouraged to strive for BMI less than 30 to decrease cardiac risk. Advised to aim for at least 150 minutes of exercise per week.   4. Encounter for immunization - Pneumococcal polysaccharide vaccine 23-valent greater than or equal to 2yo subcutaneous/IM - Zoster Vaccine Adjuvanted [facility]) injection; Inject 0.5 mLs into the muscle once for 1 dose.  Dispense: 0.5 mL; Refill: 0  5. Skin eruption Comments: Healed buttocks decubitus has hypopigmented skin  Patient was given opportunity to ask questions. Patient verbalized understanding of the plan and was able to repeat key elements of the  plan. All questions were answered to their satisfaction.  Patricia Brine, FNP   I, Patricia Brine, FNP, have reviewed all documentation for this visit. The documentation on 09/09/21 for the exam, diagnosis, procedures, and orders are all accurate and complete.   IF YOU HAVE BEEN REFERRED TO A SPECIALIST, IT MAY TAKE 1-2 WEEKS TO SCHEDULE/PROCESS THE REFERRAL. IF YOU HAVE NOT HEARD FROM US/SPECIALIST IN TWO WEEKS, PLEASE GIVE [place] A CALL AT [phone].   THE PATIENT IS ENCOURAGED TO PRACTICE SOCIAL DISTANCING DUE TO THE COVID-19 PANDEMIC.

## 2021-08-07 NOTE — Patient Instructions (Signed)
Blood Transfusion, Adult A blood transfusion is a procedure in which you receive blood or a type of blood cell (blood component) through an IV. You may need a blood transfusion when your blood level is low. This may result from a bleeding disorder, illness, injury, or surgery. The blood may come from a donor. You may also be able to donate blood for yourself (autologous blood donation) before a planned surgery. The blood given in a transfusion is made up of different blood components. You may receive: Red blood cells. These carry oxygen to the cells in the body. Platelets. These help your blood to clot. Plasma. This is the liquid part of your blood. It carries proteins and other substances throughout the body. White blood cells. These help you fight infections. If you have hemophilia or another clotting disorder, you may also receive other types of blood products. Tell a health care provider about: Any blood disorders you have. Any previous reactions you have had during a blood transfusion. Any allergies you have. All medicines you are taking, including vitamins, herbs, eye drops, creams, and over-the-counter medicines. Any surgeries you have had. Any medical conditions you have, including any recent fever or cold symptoms. Whether you are pregnant or may be pregnant. What are the risks? Generally, this is a safe procedure. However, problems may occur. The most common problems include: A mild allergic reaction, such as red, swollen areas of skin (hives) and itching. Fever or chills. This may be the body's response to new blood cells received. This may occur during or up to 4 hours after the transfusion. More serious problems may include: Transfusion-associated circulatory overload (TACO), or too much fluid in the lungs. This may cause breathing problems. A serious allergic reaction, such as difficulty breathing or swelling around the face and lips. Transfusion-related acute lung injury  (TRALI), which causes breathing difficulty and low oxygen in the blood. This can occur within hours of the transfusion or several days later. Iron overload. This can happen after receiving many blood transfusions over a period of time. Infection or virus being transmitted. This is rare because donated blood is carefully tested before it is given. Hemolytic transfusion reaction. This is rare. It happens when your body's defense system (immune system)tries to attack the new blood cells. Symptoms may include fever, chills, nausea, low blood pressure, and low back or chest pain. Transfusion-associated graft-versus-host disease (TAGVHD). This is rare. It happens when donated cells attack your body's healthy tissues. What happens before the procedure? Medicines Ask your health care provider about: Changing or stopping your regular medicines. This is especially important if you are taking diabetes medicines or blood thinners. Taking medicines such as aspirin and ibuprofen. These medicines can thin your blood. Do not take these medicines unless your health care provider tells you to take them. Taking over-the-counter medicines, vitamins, herbs, and supplements. General instructions Follow instructions from your health care provider about eating and drinking restrictions. You will have a blood test to determine your blood type. This is necessary to know what kind of blood your body will accept and to match it to the donor blood. If you are going to have a planned surgery, you may be able to do an autologous blood donation. This may be done in case you need to have a transfusion. You will have your temperature, blood pressure, and pulse monitored before the transfusion. If you have had an allergic reaction to a transfusion in the past, you may be given medicine to help prevent   a reaction. This medicine may be given to you by mouth (orally) or through an IV. Set aside time for the blood transfusion. This  procedure generally takes 1-4 hours to complete. What happens during the procedure?  An IV will be inserted into one of your veins. The bag of donated blood will be attached to your IV. The blood will then enter through your vein. Your temperature, blood pressure, and pulse will be monitored regularly during the transfusion. This monitoring is done to detect early signs of a transfusion reaction. Tell your nurse right away if you have any of these symptoms during the transfusion: Shortness of breath or trouble breathing. Chest or back pain. Fever or chills. Hives or itching. If you have any signs or symptoms of a reaction, your transfusion will be stopped and you may be given medicine. When the transfusion is complete, your IV will be removed. Pressure may be applied to the IV site for a few minutes. A bandage (dressing)will be applied. The procedure may vary among health care providers and hospitals. What happens after the procedure? Your temperature, blood pressure, pulse, breathing rate, and blood oxygen level will be monitored until you leave the hospital or clinic. Your blood may be tested to see how you are responding to the transfusion. You may be warmed with fluids or blankets to maintain a normal body temperature. If you receive your blood transfusion in an outpatient setting, you will be told whom to contact to report any reactions. Where to find more information For more information on blood transfusions, visit the [employer]: redcross.org Summary A blood transfusion is a procedure in which you receive blood or a type of blood cell (blood component) through an IV. The blood you receive may come from a donor or be donated by yourself (autologous blood donation) before a planned surgery. The blood given in a transfusion is made up of different blood components. You may receive red blood cells, platelets, plasma, or white blood cells depending on the condition treated. Your  temperature, blood pressure, and pulse will be monitored before, during, and after the transfusion. After the transfusion, your blood may be tested to see how your body has responded. This information is not intended to replace advice given to you by your health care provider. Make sure you discuss any questions you have with your health care provider. Document Revised: 05/06/2019 Document Reviewed: 12/24/2018 Elsevier Patient Education  2022 Elsevier Inc.  

## 2021-08-07 NOTE — Progress Notes (Signed)
Per Dr Alen Blew ok for only 1 U of blood today as blood is not ready and will not have time for 2 U.

## 2021-08-07 NOTE — Progress Notes (Signed)
Pt hgb 8.4, per Dr Alen Blew transfuse 2 U blood, [place], LPN placed orders. Blood bank verified prepare was started.

## 2021-08-08 LAB — TSH: TSH: 0.854 u[IU]/mL (ref 0.450–4.500)

## 2021-08-08 LAB — T3, FREE: T3, Free: 1.5 pg/mL — ABNORMAL LOW (ref 2.0–4.4)

## 2021-08-08 LAB — T4: T4, Total: 10.9 ug/dL (ref 4.5–12.0)

## 2021-08-11 LAB — TYPE AND SCREEN
ABO/RH(D): A NEG
Antibody Screen: NEGATIVE
Unit division: 0
Unit division: 0

## 2021-08-11 LAB — BPAM RBC
Blood Product Expiration Date: 202302112359
Blood Product Expiration Date: 202302182359
ISSUE DATE / TIME: 202301241412
ISSUE DATE / TIME: 202301241412
Unit Type and Rh: 600
Unit Type and Rh: 600

## 2021-08-14 ENCOUNTER — Inpatient Hospital Stay: Payer: Medicare PPO

## 2021-08-14 ENCOUNTER — Telehealth: Payer: Self-pay | Admitting: *Deleted

## 2021-08-14 ENCOUNTER — Other Ambulatory Visit: Payer: Self-pay

## 2021-08-14 DIAGNOSIS — D469 Myelodysplastic syndrome, unspecified: Secondary | ICD-10-CM

## 2021-08-14 DIAGNOSIS — D649 Anemia, unspecified: Secondary | ICD-10-CM

## 2021-08-14 DIAGNOSIS — D464 Refractory anemia, unspecified: Secondary | ICD-10-CM | POA: Diagnosis not present

## 2021-08-14 LAB — CBC WITH DIFFERENTIAL (CANCER CENTER ONLY)
Abs Immature Granulocytes: 0.02 10*3/uL (ref 0.00–0.07)
Basophils Absolute: 0 10*3/uL (ref 0.0–0.1)
Basophils Relative: 0 %
Eosinophils Absolute: 0 10*3/uL (ref 0.0–0.5)
Eosinophils Relative: 1 %
HCT: 27.8 % — ABNORMAL LOW (ref 36.0–46.0)
Hemoglobin: 9.3 g/dL — ABNORMAL LOW (ref 12.0–15.0)
Immature Granulocytes: 0 %
Lymphocytes Relative: 18 %
Lymphs Abs: 0.9 10*3/uL (ref 0.7–4.0)
MCH: 30 pg (ref 26.0–34.0)
MCHC: 33.5 g/dL (ref 30.0–36.0)
MCV: 89.7 fL (ref 80.0–100.0)
Monocytes Absolute: 0.9 10*3/uL (ref 0.1–1.0)
Monocytes Relative: 18 %
Neutro Abs: 3.2 10*3/uL (ref 1.7–7.7)
Neutrophils Relative %: 63 %
Platelet Count: 158 10*3/uL (ref 150–400)
RBC: 3.1 MIL/uL — ABNORMAL LOW (ref 3.87–5.11)
RDW: 18.9 % — ABNORMAL HIGH (ref 11.5–15.5)
WBC Count: 5.1 10*3/uL (ref 4.0–10.5)
nRBC: 0.6 % — ABNORMAL HIGH (ref 0.0–0.2)

## 2021-08-14 LAB — SAMPLE TO BLOOD BANK

## 2021-08-14 NOTE — Telephone Encounter (Signed)
Pt hgb is 9.3. Per Dr.Shadad, no transfusion today. Infusion nurse is aware.

## 2021-08-21 ENCOUNTER — Inpatient Hospital Stay: Payer: Medicare PPO

## 2021-08-21 ENCOUNTER — Inpatient Hospital Stay: Payer: Medicare PPO | Attending: Oncology

## 2021-08-21 ENCOUNTER — Other Ambulatory Visit: Payer: Self-pay | Admitting: *Deleted

## 2021-08-21 ENCOUNTER — Other Ambulatory Visit: Payer: Self-pay

## 2021-08-21 DIAGNOSIS — D464 Refractory anemia, unspecified: Secondary | ICD-10-CM | POA: Diagnosis not present

## 2021-08-21 DIAGNOSIS — D500 Iron deficiency anemia secondary to blood loss (chronic): Secondary | ICD-10-CM

## 2021-08-21 DIAGNOSIS — D469 Myelodysplastic syndrome, unspecified: Secondary | ICD-10-CM

## 2021-08-21 DIAGNOSIS — D649 Anemia, unspecified: Secondary | ICD-10-CM

## 2021-08-21 LAB — CBC WITH DIFFERENTIAL (CANCER CENTER ONLY)
Abs Immature Granulocytes: 0.03 10*3/uL (ref 0.00–0.07)
Basophils Absolute: 0 10*3/uL (ref 0.0–0.1)
Basophils Relative: 0 %
Eosinophils Absolute: 0 10*3/uL (ref 0.0–0.5)
Eosinophils Relative: 1 %
HCT: 23.2 % — ABNORMAL LOW (ref 36.0–46.0)
Hemoglobin: 8.1 g/dL — ABNORMAL LOW (ref 12.0–15.0)
Immature Granulocytes: 1 %
Lymphocytes Relative: 14 %
Lymphs Abs: 0.7 10*3/uL (ref 0.7–4.0)
MCH: 31.2 pg (ref 26.0–34.0)
MCHC: 34.9 g/dL (ref 30.0–36.0)
MCV: 89.2 fL (ref 80.0–100.0)
Monocytes Absolute: 1 10*3/uL (ref 0.1–1.0)
Monocytes Relative: 19 %
Neutro Abs: 3.5 10*3/uL (ref 1.7–7.7)
Neutrophils Relative %: 65 %
Platelet Count: 155 10*3/uL (ref 150–400)
RBC: 2.6 MIL/uL — ABNORMAL LOW (ref 3.87–5.11)
RDW: 19.1 % — ABNORMAL HIGH (ref 11.5–15.5)
WBC Count: 5.2 10*3/uL (ref 4.0–10.5)
nRBC: 0.6 % — ABNORMAL HIGH (ref 0.0–0.2)

## 2021-08-21 LAB — CMP (CANCER CENTER ONLY)
ALT: 20 U/L (ref 0–44)
AST: 33 U/L (ref 15–41)
Albumin: 3.9 g/dL (ref 3.5–5.0)
Alkaline Phosphatase: 124 U/L (ref 38–126)
Anion gap: 10 (ref 5–15)
BUN: 48 mg/dL — ABNORMAL HIGH (ref 8–23)
CO2: 32 mmol/L (ref 22–32)
Calcium: 9.9 mg/dL (ref 8.9–10.3)
Chloride: 100 mmol/L (ref 98–111)
Creatinine: 5.36 mg/dL (ref 0.44–1.00)
GFR, Estimated: 8 mL/min — ABNORMAL LOW (ref >60)
Glucose, Bld: 125 mg/dL — ABNORMAL HIGH (ref 70–99)
Potassium: 3.9 mmol/L (ref 3.5–5.1)
Sodium: 142 mmol/L (ref 135–145)
Total Bilirubin: 1.3 mg/dL — ABNORMAL HIGH (ref 0.3–1.2)
Total Protein: 7 g/dL (ref 6.5–8.1)

## 2021-08-21 LAB — SAMPLE TO BLOOD BANK

## 2021-08-21 LAB — PREPARE RBC (CROSSMATCH)

## 2021-08-21 MED ORDER — ACETAMINOPHEN 325 MG PO TABS
650.0000 mg | ORAL_TABLET | Freq: Once | ORAL | Status: AC
  Administered 2021-08-21: 650 mg via ORAL
  Filled 2021-08-21: qty 2

## 2021-08-21 MED ORDER — SODIUM CHLORIDE 0.9% IV SOLUTION
250.0000 mL | Freq: Once | INTRAVENOUS | Status: AC
  Administered 2021-08-21: 250 mL via INTRAVENOUS

## 2021-08-21 MED ORDER — DIPHENHYDRAMINE HCL 25 MG PO CAPS
50.0000 mg | ORAL_CAPSULE | Freq: Once | ORAL | Status: AC
  Administered 2021-08-21: 50 mg via ORAL
  Filled 2021-08-21: qty 2

## 2021-08-21 NOTE — Progress Notes (Signed)
CRITICAL VALUE STICKER  CRITICAL VALUE: Creatnine 5.36  RECEIVER (on-site recipient of call): Velna Ochs RN  DATE & TIME NOTIFIED: 08/21/21 @ 1151  MESSENGER (representative from lab): Lauren  MD NOTIFIED: Alen Blew  TIME OF NOTIFICATION: 1152  RESPONSE:  MD aware

## 2021-08-21 NOTE — Patient Instructions (Signed)
Blood Transfusion, Adult, Care After This sheet gives you information about how to care for yourself after your procedure. Your doctor may also give you more specific instructions. If you have problems or questions, contact your doctor. What can I expect after the procedure? After the procedure, it is common to have: Bruising and soreness at the IV site. A headache. Follow these instructions at home: Insertion site care   Follow instructions from your doctor about how to take care of your insertion site. This is where an IV tube was put into your vein. Make sure you: Wash your hands with soap and water before and after you change your bandage (dressing). If you cannot use soap and water, use hand sanitizer. Change your bandage as told by your doctor. Check your insertion site every day for signs of infection. Check for: Redness, swelling, or pain. Bleeding from the site. Warmth. Pus or a bad smell. General instructions Take over-the-counter and prescription medicines only as told by your doctor. Rest as told by your doctor. Go back to your normal activities as told by your doctor. Keep all follow-up visits as told by your doctor. This is important. Contact a doctor if: You have itching or red, swollen areas of skin (hives). You feel worried or nervous (anxious). You feel weak after doing your normal activities. You have redness, swelling, warmth, or pain around the insertion site. You have blood coming from the insertion site, and the blood does not stop with pressure. You have pus or a bad smell coming from the insertion site. Get help right away if: You have signs of a serious reaction. This may be coming from an allergy or the body's defense system (immune system). Signs include: Trouble breathing or shortness of breath. Swelling of the face or feeling warm (flushed). Fever or chills. Head, chest, or back pain. Dark pee (urine) or blood in the pee. Widespread rash. Fast  heartbeat. Feeling dizzy or light-headed. You may receive your blood transfusion in an outpatient setting. If so, you will be told whom to contact to report any reactions. These symptoms may be an emergency. Do not wait to see if the symptoms will go away. Get medical help right away. Call your local emergency services (911 in the [place] Do not drive yourself to the hospital. Summary Bruising and soreness at the IV site are common. Check your insertion site every day for signs of infection. Rest as told by your doctor. Go back to your normal activities as told by your doctor. Get help right away if you have signs of a serious reaction. This information is not intended to replace advice given to you by your health care provider. Make sure you discuss any questions you have with your health care provider. Document Revised: 10/26/2020 Document Reviewed: 12/24/2018 Elsevier Patient Education  2022 [employer] Inc.  

## 2021-08-22 LAB — TYPE AND SCREEN
ABO/RH(D): A NEG
Antibody Screen: NEGATIVE
Unit division: 0

## 2021-08-22 LAB — BPAM RBC
Blood Product Expiration Date: 202302192359
ISSUE DATE / TIME: 202302071318
Unit Type and Rh: 600

## 2021-08-26 ENCOUNTER — Other Ambulatory Visit: Payer: Self-pay | Admitting: Nurse Practitioner

## 2021-08-26 DIAGNOSIS — E7800 Pure hypercholesterolemia, unspecified: Secondary | ICD-10-CM

## 2021-08-27 ENCOUNTER — Encounter (HOSPITAL_COMMUNITY): Payer: Medicare Other | Admitting: Internal Medicine

## 2021-08-28 ENCOUNTER — Other Ambulatory Visit: Payer: Self-pay | Admitting: *Deleted

## 2021-08-28 ENCOUNTER — Inpatient Hospital Stay: Payer: Medicare PPO

## 2021-08-28 ENCOUNTER — Other Ambulatory Visit: Payer: Self-pay

## 2021-08-28 DIAGNOSIS — D500 Iron deficiency anemia secondary to blood loss (chronic): Secondary | ICD-10-CM

## 2021-08-28 DIAGNOSIS — D464 Refractory anemia, unspecified: Secondary | ICD-10-CM | POA: Diagnosis not present

## 2021-08-28 DIAGNOSIS — D649 Anemia, unspecified: Secondary | ICD-10-CM

## 2021-08-28 DIAGNOSIS — D469 Myelodysplastic syndrome, unspecified: Secondary | ICD-10-CM

## 2021-08-28 LAB — CBC WITH DIFFERENTIAL (CANCER CENTER ONLY)
Abs Immature Granulocytes: 0.02 10*3/uL (ref 0.00–0.07)
Basophils Absolute: 0 10*3/uL (ref 0.0–0.1)
Basophils Relative: 0 %
Eosinophils Absolute: 0.1 10*3/uL (ref 0.0–0.5)
Eosinophils Relative: 1 %
HCT: 26.8 % — ABNORMAL LOW (ref 36.0–46.0)
Hemoglobin: 8.9 g/dL — ABNORMAL LOW (ref 12.0–15.0)
Immature Granulocytes: 0 %
Lymphocytes Relative: 16 %
Lymphs Abs: 1 10*3/uL (ref 0.7–4.0)
MCH: 30.1 pg (ref 26.0–34.0)
MCHC: 33.2 g/dL (ref 30.0–36.0)
MCV: 90.5 fL (ref 80.0–100.0)
Monocytes Absolute: 1.1 10*3/uL — ABNORMAL HIGH (ref 0.1–1.0)
Monocytes Relative: 17 %
Neutro Abs: 4.1 10*3/uL (ref 1.7–7.7)
Neutrophils Relative %: 66 %
Platelet Count: 152 10*3/uL (ref 150–400)
RBC: 2.96 MIL/uL — ABNORMAL LOW (ref 3.87–5.11)
RDW: 18.7 % — ABNORMAL HIGH (ref 11.5–15.5)
WBC Count: 6.2 10*3/uL (ref 4.0–10.5)
nRBC: 0.6 % — ABNORMAL HIGH (ref 0.0–0.2)

## 2021-08-28 LAB — PREPARE RBC (CROSSMATCH)

## 2021-08-28 MED ORDER — DIPHENHYDRAMINE HCL 25 MG PO CAPS
50.0000 mg | ORAL_CAPSULE | Freq: Once | ORAL | Status: AC
  Administered 2021-08-28: 50 mg via ORAL
  Filled 2021-08-28: qty 2

## 2021-08-28 MED ORDER — ACETAMINOPHEN 325 MG PO TABS
650.0000 mg | ORAL_TABLET | Freq: Once | ORAL | Status: AC
  Administered 2021-08-28: 650 mg via ORAL
  Filled 2021-08-28: qty 2

## 2021-08-28 MED ORDER — SODIUM CHLORIDE 0.9% IV SOLUTION: 250.0000 mL | Freq: Once | INTRAVENOUS | Status: DC

## 2021-08-28 NOTE — Patient Instructions (Signed)
Blood Transfusion, Adult, Care After This sheet gives you information about how to care for yourself after your procedure. Your doctor may also give you more specific instructions. If you have problems or questions, contact your doctor. What can I expect after the procedure? After the procedure, it is common to have: Bruising and soreness at the IV site. A headache. Follow these instructions at home: Insertion site care   Follow instructions from your doctor about how to take care of your insertion site. This is where an IV tube was put into your vein. Make sure you: Wash your hands with soap and water before and after you change your bandage (dressing). If you cannot use soap and water, use hand sanitizer. Change your bandage as told by your doctor. Check your insertion site every day for signs of infection. Check for: Redness, swelling, or pain. Bleeding from the site. Warmth. Pus or a bad smell. General instructions Take over-the-counter and prescription medicines only as told by your doctor. Rest as told by your doctor. Go back to your normal activities as told by your doctor. Keep all follow-up visits as told by your doctor. This is important. Contact a doctor if: You have itching or red, swollen areas of skin (hives). You feel worried or nervous (anxious). You feel weak after doing your normal activities. You have redness, swelling, warmth, or pain around the insertion site. You have blood coming from the insertion site, and the blood does not stop with pressure. You have pus or a bad smell coming from the insertion site. Get help right away if: You have signs of a serious reaction. This may be coming from an allergy or the body's defense system (immune system). Signs include: Trouble breathing or shortness of breath. Swelling of the face or feeling warm (flushed). Fever or chills. Head, chest, or back pain. Dark pee (urine) or blood in the pee. Widespread rash. Fast  heartbeat. Feeling dizzy or light-headed. You may receive your blood transfusion in an outpatient setting. If so, you will be told whom to contact to report any reactions. These symptoms may be an emergency. Do not wait to see if the symptoms will go away. Get medical help right away. Call your local emergency services (911 in the [place] Do not drive yourself to the hospital. Summary Bruising and soreness at the IV site are common. Check your insertion site every day for signs of infection. Rest as told by your doctor. Go back to your normal activities as told by your doctor. Get help right away if you have signs of a serious reaction. This information is not intended to replace advice given to you by your health care provider. Make sure you discuss any questions you have with your health care provider. Document Revised: 10/26/2020 Document Reviewed: 12/24/2018 Elsevier Patient Education  2022 [employer] Inc.  

## 2021-08-29 LAB — BPAM RBC
Blood Product Expiration Date: 202303052359
ISSUE DATE / TIME: 202302141344
Unit Type and Rh: 600

## 2021-08-29 LAB — TYPE AND SCREEN
ABO/RH(D): A NEG
Antibody Screen: NEGATIVE
Unit division: 0

## 2021-08-29 LAB — SAMPLE TO BLOOD BANK

## 2021-08-30 ENCOUNTER — Ambulatory Visit (HOSPITAL_COMMUNITY)
Admission: RE | Admit: 2021-08-30 | Discharge: 2021-08-30 | Disposition: A | Discharge status: Home / Self Care | Payer: Medicare PPO | Source: Ambulatory Visit | Attending: Internal Medicine | Admitting: Internal Medicine

## 2021-08-30 ENCOUNTER — Other Ambulatory Visit: Payer: Self-pay

## 2021-08-30 ENCOUNTER — Encounter (HOSPITAL_COMMUNITY): Payer: Self-pay | Admitting: Internal Medicine

## 2021-08-30 VITALS — BP 140/50 | HR 61 | Wt 195.6 lb

## 2021-08-30 DIAGNOSIS — D631 Anemia in chronic kidney disease: Secondary | ICD-10-CM | POA: Diagnosis not present

## 2021-08-30 DIAGNOSIS — M868X9 Other osteomyelitis, unspecified sites: Secondary | ICD-10-CM | POA: Diagnosis not present

## 2021-08-30 DIAGNOSIS — I480 Paroxysmal atrial fibrillation: Secondary | ICD-10-CM | POA: Insufficient documentation

## 2021-08-30 DIAGNOSIS — K922 Gastrointestinal hemorrhage, unspecified: Secondary | ICD-10-CM | POA: Insufficient documentation

## 2021-08-30 DIAGNOSIS — E1151 Type 2 diabetes mellitus with diabetic peripheral angiopathy without gangrene: Secondary | ICD-10-CM | POA: Diagnosis not present

## 2021-08-30 DIAGNOSIS — D649 Anemia, unspecified: Secondary | ICD-10-CM | POA: Diagnosis not present

## 2021-08-30 DIAGNOSIS — I350 Nonrheumatic aortic (valve) stenosis: Secondary | ICD-10-CM | POA: Diagnosis not present

## 2021-08-30 DIAGNOSIS — D469 Myelodysplastic syndrome, unspecified: Secondary | ICD-10-CM | POA: Insufficient documentation

## 2021-08-30 DIAGNOSIS — N186 End stage renal disease: Secondary | ICD-10-CM | POA: Insufficient documentation

## 2021-08-30 DIAGNOSIS — I5032 Chronic diastolic (congestive) heart failure: Secondary | ICD-10-CM | POA: Insufficient documentation

## 2021-08-30 DIAGNOSIS — I272 Pulmonary hypertension, unspecified: Secondary | ICD-10-CM | POA: Insufficient documentation

## 2021-08-30 DIAGNOSIS — E1122 Type 2 diabetes mellitus with diabetic chronic kidney disease: Secondary | ICD-10-CM | POA: Diagnosis not present

## 2021-08-30 DIAGNOSIS — I2721 Secondary pulmonary arterial hypertension: Secondary | ICD-10-CM | POA: Insufficient documentation

## 2021-08-30 DIAGNOSIS — I132 Hypertensive heart and chronic kidney disease with heart failure and with stage 5 chronic kidney disease, or end stage renal disease: Secondary | ICD-10-CM | POA: Insufficient documentation

## 2021-08-30 DIAGNOSIS — Z79899 Other long term (current) drug therapy: Secondary | ICD-10-CM | POA: Insufficient documentation

## 2021-08-30 NOTE — Patient Instructions (Signed)
Good to see you today   No lab work needed  Your physician recommends that you schedule a follow-up appointment in: 59months  If you have any questions or concerns before your next appointment please send [place] a message through [place] or call our office at [phone].    TO LEAVE A MESSAGE FOR THE NURSE SELECT OPTION 2, PLEASE LEAVE A MESSAGE INCLUDING: YOUR NAME DATE OF BIRTH CALL BACK NUMBER REASON FOR CALL**this is important as we prioritize the call backs  YOU WILL RECEIVE A CALL BACK THE SAME DAY AS LONG AS YOU CALL BEFORE 4:00 PM  At the [facility], you and your health needs are our priority. As part of our continuing mission to provide you with exceptional heart care, we have created designated Provider Care Teams. These Care Teams include your primary Cardiologist (physician) and Advanced Practice Providers (APPs- Physician Assistants and Nurse Practitioners) who all work together to provide you with the care you need, when you need it.   You may see any of the following providers on your designated Care Team at your next follow up: Dr Glori Bickers Dr Haynes Kerns, NP Lyda Jester, [place] [facility] [place], [place] Audry Riles, PharmD   Please be sure to bring in all your medications bottles to every appointment.   Do the following things EVERYDAY: Weigh yourself in the morning before breakfast. Write it down and keep it in a log. Take your medicines as prescribed Eat low salt foods--Limit salt (sodium) to 2000 mg per day.  Stay as active as you can everyday Limit all fluids for the day to less than 2 liters

## 2021-08-30 NOTE — Progress Notes (Signed)
Advanced Heart Failure Clinic Note   PCP: [name], FNP PCP-Cardiologist: Quay Burow, MD  Hematologist: Dr. Alen Blew AHF: Dr. Haroldine Laws  Nephrology: Dr Resa Miner Structural Heart Team: Dr Burt Knack.   HPI: Patricia Mata is a 66 y.o. 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    Admitted 10/19  with AF with RVR and recurrent anemia. Developed respiratory failure and intubated. She was transfused and placed on IV amio. Subsequently developed AKI and placed on CVVHD. Converted to NSR on amio and weaned off the vent.Echo showed normal LVEF 65-70% with moderate AS and moderate MS and severe PH with RVSP 14mHG. She is not a candidate for intervention of AS or MS with advanced PAH and transfusion-dependent myelodysplasia as a result of her colon CA    [facility] showed normal coron[facility] and severely elevated PA pressures.-> V/Q scan was negative. She was started on sildenafil and tolerated well. She diuresed with IV lasix and transitioned to lasix 40 mg PO BID. Palliative care was consulted and decision was made to transition to DNR.   She follows with Dr. SAlen Blewand requires transfusions every 2-3 weeks as an outpatient. She also developed a buttock wound. WOC was consulted and recommended hydrotherapy for coccygeal osteo.   [facility]   Had [place]/[facility]/20 that showed mild PAH in setting of high-output (likely due to anemia). See data below  Admitted for CAP 3/21 and treated w/ abx. Returned to hospital 3 days later for worsening SOB and admitted for acute hypoxic respiratory failure requiring intubation. Chest CT showed multifocal PNA COVID negative..A/C CHF also felt to be contributing.   Admitted 11/21 for acute on chronic CHF. Echo 11/21 EF 60-65% G2DD severe AS mean 358mG AVA 0.84cm2. Moderate MS    Admitted 12/21 for recurrent volume overload and AKI. Cr 6.45. (baseline 2.8-3.5). Diuresed. Creatinine improved to 4.9 on d/c. Weight 221. Dr. GoMoshe Ciprouggested HD but patient refused.   Admitted 10/09/20 with NSTEMI. Hospital course complicated by AKI>>ESRD. Had RHC/LHC with significant CAD with 50-60% mid LM disease but otherwise non-obstructive CAD. Structural Heart evaluated and there was concern anatomy would make TAVR difficult. Structural Heart team will re-evaluate after she starts HD. HD started  Admitted 11/21/20 with chest pain. Missed HD appointment. Cardiology/Nephrology consulted. HS trop not elevated. Hgb was down to 7.5. Given unit of blood. Had HD and discharged on 11/23/20.   Evaluated in ED 6/22 for anemia. Given 2 U PRBCs and discharged. Admitted 7/22 for anemia and HD after missing dialysis outpatient. Received 2 units PRBCs and was dialyzed.   Admitted 8/22 for anemia, hgb 5.0. GI consulted and she underwent enteroscopy showing AVM treated with APC and 2 clips.  Colonoscopy with no evidence of active bleeding. She was discharged on home dose of HF medications.  Admitted 10/22 w/ afib RVR. Cards consulted, started on IV amiodarone and converted to SR. EP felt not good candidate for Watchman or ablation. Transitioned to PO amiodarone and discharged.  Several admission for symptomatic anemia requiring transfusions in 11/22. Underwent enteroscopy 05/24/21 with single non-bleeding gastric angiectasia s/p APC.  Saw Dr. CoBurt Knackor f/u 01/23. Not currently a candidate for TAVR d/t comorbid conditions.   Echo 01/23: EF 60% severe AS mean gradient 4937m  [facility].  She is here  today for HF f/u. Has some dyspnea climbing flight of stairs. Able to do some light housework without difficulty. No orthopnea, PND or leg edema. Denies CP or palpitations. Reports hydralazine was stopped by Nephrology d/t hypotension during  dialysis. Now takes midodrine on dialysis days. Received 2 U RBCs this week.    Cardiac studies: RHC/LHC 10/13/20  Mid RCA lesion is 20% stenosed. Mid LM lesion is 60% stenosed. Ost Cx to Prox Cx lesion is 30% stenosed. Mid LAD lesion is 30% stenosed  Ao = 152/38 (76) LV =  186/25 RA =  6 RV = 76/11 PA = 74/19 (43) PCW = unable to obtain Fick cardiac output/index = 9.1/4.6 PVR = 2.0 (using LVEDP) Ao sat = 96% PA sat = 67%. 67%  AoV: Crossed easily with JR4 and J-wire. Peak-to-peak gradient 33mHg  Mean gradient 347mG AVA 1.4cm2 Assessment:  1. Significant CAD with 60% mid LM disease. O/w non-obstructive CAD 2. EF 60% 3. Moderate to severe pulmonary HTN due to high output 4. Moderate to severe AS Results d/w TAVR team. Given unfavorable anatomy, burden of comorbidites and need for alternative access, I suspect risks of TAVR outweigh possible benefits. Favor medical management.  RH[place]0/20 RA = 8 RV = 53/3 PA = 57/12 (32) PCW = 14 (v waves to 32) Fick cardiac output/index = 8.4/4.5 PVR = 2.1 WU Ao sat = 99% PA sat = 73%, 74% High SVC sat = 74%  RHC 11/19 RA  14 RV  110/15 PA 105/40 (62) PCWP 25 Ao sat 91% PA sat 53% Fick CO/CI  6.8/4.3  PVR 5.4  AoV  Mean 2674m AVA 1.3 cm2  PFTs 8/20 FEV1 2.24 (112%) FVC 2.63 (103%) DLCO 56%  Echo 02/15/19 EF 65% RV normal size/function. Septal flattening. Severe AS (mean 49m17m RVSP 64mm86m  Echo 11/21 EF 60-65% G2DD severe AS mean 35mmH17mA 0.84cm2. Moderate MS RVSP 54mmHG18meview of systems complete and found to be negative unless listed in HPI.    Past Medical History:  Diagnosis Date   Acute renal failure (ARF) (HCC) 11[place]/2019   [facility] [place] artery disease ([facility])  [facility])    Colon cancer (HCC) 20[place]  Diabetes mellitus (HCC)   [place]    ESRD on hemodialysis (HCC)   [place]   Heart attack (HCC)   [place]    Hypertension     Hypothyroidism    Macrocytic anemia    MDS (myelodysplastic syndrome) (HCC)    Moderate mitral stenosis    PAD (peripheral artery disease) (HCC)    PAF (paroxysmal atrial fibrillation) (HCC)    Pulmonary hypertension (HCC)    Renal artery stenosis (HCC)    a. >60% R renal artery stenosis by duplex 2021, managed medically.   Severe aortic stenosis    Transfusion-dependent anemia     Current Outpatient Medications  Medication Sig Dispense Refill   Accu-Chek Softclix Lancets lancets Use to check blood sugar 4 times a day. Dx code E11.65 400 each 12   acetaminophen (TYLENOL) 500 MG tablet Take 1,000 mg by mouth every 6 (six) hours as needed (pain).     allopurinol (ZYLOPRIM) 300 MG tablet Take 300 mg by mouth every morning.     amiodarone (PACERONE) 200 MG tablet Take 1 tablet by mouth once daily 90 tablet 1   atorvastatin (LIPITOR) 40 MG tablet Take 1 tablet by mouth once daily 90 tablet 0  blood glucose meter kit and supplies Dispense based on patient and insurance preference. Use up to four times daily as directed. (FOR ICD-10 E10.9, E11.9). 1 each 3   Blood Glucose Monitoring Suppl (ACCU-CHEK GUIDE ME) w/Device KIT Use to check blood sugar 4 times a day. Dx code E11.65 1 kit 3   carvedilol (COREG) 6.25 MG tablet TAKE 1 TABLET BY MOUTH TWICE DAILY WITH A MEAL 180 tablet 1   colchicine 0.6 MG tablet 1 tablet as needed     diphenhydrAMINE (BENADRYL) 25 MG tablet Take 25 mg by mouth every 8 (eight) hours as needed for itching.     ethyl chloride spray Apply 1 application topically See admin instructions. Apply topically to dialysis site if there is no time to use emla cream     glucose blood (ACCU-CHEK GUIDE) test strip Use to check blood sugar 4 times a day. Dx code E11.65 400 each 12   Insulin Glargine (BASAGLAR KWIKPEN) 100 UNIT/ML Inject 20 Units into the skin daily as needed (only if blood sugar over 150).     Insulin Pen Needle 29G X 5MM MISC Use as directed 200 each 0   latanoprost  (XALATAN) 0.005 % ophthalmic solution Place 1 drop into both eyes at bedtime.      levothyroxine (SYNTHROID) 175 MCG tablet Take 1 tablet (175 mcg total) by mouth daily before breakfast. 30 tablet 2   lidocaine-prilocaine (EMLA) cream Apply 1 application topically See admin instructions. Apply to dialysis site 30 minutes before arriving at dialysis on Tuesday, Thursday, Saturday     LOPERAMIDE HCL PO Take by mouth.     loratadine (CLARITIN) 10 MG tablet Take 1 tablet (10 mg total) by mouth daily as needed for allergies. 90 tablet 2   Methoxy PEG-Epoetin Beta (MIRCERA IJ) Mircera     Multiple Vitamin (MULTIVITAMIN WITH MINERALS) TABS tablet Take 1 tablet by mouth every morning. Centrum Silver 50+     ONETOUCH ULTRA test strip CHECK BLOOD SUGARS TWICE DAILY 100 each 11   pantoprazole (PROTONIX) 40 MG tablet Take 1 tablet (40 mg total) by mouth daily. 90 tablet 6   polyethylene glycol (MIRALAX / GLYCOLAX) 17 g packet Take 17 g by mouth daily as needed for moderate constipation.     sildenafil (REVATIO) 20 MG tablet TAKE 1 TABLET BY MOUTH THREE TIMES DAILY 90 tablet 3   TRADJENTA 5 MG TABS tablet Take 5 mg by mouth daily.     No current facility-administered medications for this encounter.   Allergies  Allergen Reactions   Lactose Intolerance (Gi) Diarrhea   Morphine And Related Itching   Sulfa Antibiotics Rash and Other (See Comments)    Blisters, also   Social History   Socioeconomic History   Marital status: Married    Spouse name: Not on file   Number of children: Not on file   Years of education: Not on file   Highest education level: Not on file  Occupational History   Occupation: retired  Tobacco Use   Smoking status: Former    Packs/day: 0.25    Years: 20.00    Pack years: 5.00    Types: Cigarettes    Quit date: 1997    Years since quitting: 26.1   Smokeless tobacco: Former  Scientific laboratory technician Use: Never used  Substance and Sexual Activity   Alcohol use: Yes     Alcohol/week: 1.0 standard drink    Types: 1 Standard drinks or equivalent per week  Comment: occ   Drug use: Never   Sexual activity: Not on file  Other Topics Concern   Not on file  Social History Narrative   Not on file   Social Determinants of Health   Financial Resource Strain: Low Risk    Difficulty of Paying Living Expenses: Not hard at all  Food Insecurity: No Food Insecurity   Worried About Running Out of Food in the Last Year: Never true   [place] in the Last Year: Never true  Transportation Needs: No Transportation Needs   Lack of Transportation (Medical): No   Lack of Transportation (Non-Medical): No  Physical Activity: Inactive   Days of Exercise per Week: 0 days   Minutes of Exercise per Session: 0 min  Stress: No Stress Concern Present   Feeling of Stress : Not at all  Social Connections: Not on file  Intimate Partner Violence: Not on file   Family History  Problem Relation Age of Onset   Hypertension Mother    Diabetes Mother    Cervical cancer Mother    Heart attack Father    Hypertension Sister    Hypertension Brother    Hypertension Sister    Hypertension Sister    Prostate cancer Brother    HIV/AIDS Brother    BP (!) 140/50    Pulse 61    Wt 88.7 kg (195 lb 9.6 oz)    SpO2 99%    BMI 33.57 kg/m   Wt Readings from Last 3 Encounters:  08/30/21 88.7 kg (195 lb 9.6 oz)  08/07/21 89.6 kg (197 lb 9.6 oz)  07/31/21 90.2 kg (198 lb 12.8 oz)    PHYSICAL EXAM: General:  No distress. Arrived in wheelchair HEENT: normal Neck: supple. no JVD.  Cor: PMI nondisplaced. Regular rate & rhythm. No rubs, gallop, 3/6 SEM RUSB radiating to carotids, absent A2. Lungs: clear Abdomen: soft, nontender, nondistended. No hepatosplenomegaly. No bruits or masses. Good bowel sounds. Extremities: no cyanosis, clubbing, rash, edema Neuro: alert & orientedx3, cranial nerves grossly intact. moves all 4 extremities w/o difficulty. Affect pleasant   ASSESSMENT &  PLAN:  1. PAH - Echo 05/12/18 LVEF 65-70%, Grade 2 DD, Mod AS, Mild/Mod MS, Mod TR, Trivial PI, PA peak pressure 93.  - L/RHC 05/21/18 with no significant CAD, severe pulmonary HTN with PA 105/40 (62) PCW 33 PVR 5.4 WU - Suspect multifactorial in nature suspect including high output due to anemia, WHO group II (diastolic HF and valvular disease) and III (OSA) but given degree of PH cannot exclude WHO Group I component. - Repeat RHC 04/23/19 with much improved pulmonary pressures. PA 57/12 (32) PCWP 14 (v to 32) - Echo 11/21 EF 60-65% G2DD severe AS mean 6mHG AVA 0.84cm2. Moderate MS RVSP 522mG - VQ negative. - PFTs 8/20 with normal spirometry and moderately reduced DLCO. - Home sleep study ordered but has not been completed. - Continue sildenafil 20 mg tid cautiously (has AS).   2. Chronic diastolic HF - Echo 8/8/25F 65% with normal RV size and function. + septal flattening. RVSP 64 mmHG. - Echo 11/21 EF 60-65% G2DD severe AS mean 3523m AVA 0.84cm2. Moderate MS RVSP 54 mmHG. - Echo 3/22 EF 50-55%, RV mildly reduced, degenerative mitral valve, moderate MS - Echo 01/23: EF 60-65%, RV okay, severe aortic valve stenosis with mean gradient of 49 mmHg, AVA 0.84 cm2, mild to moderate MS with mean gradient 4.5 mmHg - Multiple hospitalizations for ADHF/AKI. - NYHA II. Volume managed on  HD T/TH/Sat. Volume good today. - Continue carvedilol 6.25 mg bid. - Off hydralazine d/t hypotension during HD  3. Severe AS - She has seen Dr. Alen Blew and he has made it clear that she has a terminal illness but he would have no issue with using Plavix if needed - Had cath 10/2020 AoV: Crossed easily with JR4 and J-wire. Peak-to-peak gradient 24mHg  Mean gradient 343mG AVA 1.4cm2. Anatomy may not be suitable for TAVR.  - Echo 01/23: EF 60-65%, severe AS with mean gradient 49 mmHg and AVA 0.84 cm2 - Saw Dr. CoBurt Knack1/23, not currently a good candidate for TAVR d/t comorbidities and no transfemoral access. Has LM  disease, ESRD on HD and transfusion dependent MDS.  - Discussed natural progression of aortic stenosis and symptoms to watch for. Appears stable today.   4. PAF - SR on ECG today - Continue amiodarone 200 daily. TSH and LFTs okay 01/23. - Continue to avoid anticoagulation with transfusion dependent anemia. (chronic).  - CHA2DS2/VASc is at least 4.    5. ESRD - Tolerating HD well M/W/F. - [facility]. [facility]. 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 - [place]fuse as needed [place] [facility] - [place] [facility] [place] [facility] [place] [facility] [place]  7. H/o Upper GI Bleed - Denies abnormal bleeding. - Has follow up with Dr. ScMichail Sermon- Continue PPI.  8. HTN - BP mildly elevated today but stable at home. Low during HD. - Takes coreg non-dialysis days - Off hydralazine d/t hypotension during HD. 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 - No further issues.   F/u: 3 months   FINCH, LINDSAY N, PA-C 08/30/21   Patient seen and examined with the above-signed Advanced Practice Provider and/or Housestaff. I personally reviewed laboratory data, imaging studies and relevant notes. I independently examined the patient and formulated the important aspects of the plan. I have edited the note to reflect any of my changes or salient points. I have personally discussed the plan with the patient and/or family.  89/o woman as above with severe AS, ESRD, transfusion-dependent anemia and PAH.  Overall symptoms relatively stable but recent echo with ongoing progression of AS. Has been having more troubles with low BP on HD but able to complete. Denies CP or syncope. Continues with frequent transfusions. Has seen Dr. CoBurt Knackecently and not felt to be TAVR candidate  General:  Well appearing.  No resp difficulty HEENT: normal Neck: supple.JVP 7 Carotids 2+ bilat; + bruits. No lymphadenopathy or thryomegaly appreciated. Cor: PMI nondisplaced. Regular rate & rhythm. 3/6 AS s2 obliterated Lungs: clear Abdomen: soft, nontender, nondistended. No hepatosplenomegaly. No bruits or masses. Good bowel sounds. Extremities: no cyanosis, clubbing, rash, edema Neuro: alert & orientedx3, cranial nerves grossly intact. moves all 4 extremities w/o difficulty. Affect pleasant   Remains NYHA II-III. Unfortunately AS now progressing and having more challenges tolerating HD. [facility] so she will likely not be candidate given her age and presence of transfusion-dependent anemia.   At this point, we do not have much left to offer her.   DaGlori BickersMD  2:12 PM

## 2021-09-02 ENCOUNTER — Inpatient Hospital Stay (HOSPITAL_COMMUNITY)
Admission: EM | Admit: 2021-09-02 | Discharge: 2021-09-12 | DRG: 533 | Disposition: E | Payer: Medicare PPO | Attending: Internal Medicine | Admitting: Internal Medicine

## 2021-09-02 ENCOUNTER — Emergency Department (HOSPITAL_COMMUNITY): Payer: Medicare PPO

## 2021-09-02 ENCOUNTER — Other Ambulatory Visit: Payer: Self-pay

## 2021-09-02 DIAGNOSIS — D638 Anemia in other chronic diseases classified elsewhere: Secondary | ICD-10-CM | POA: Diagnosis present

## 2021-09-02 DIAGNOSIS — S96912A Strain of unspecified muscle and tendon at ankle and foot level, left foot, initial encounter: Secondary | ICD-10-CM | POA: Diagnosis present

## 2021-09-02 DIAGNOSIS — I132 Hypertensive heart and chronic kidney disease with heart failure and with stage 5 chronic kidney disease, or end stage renal disease: Secondary | ICD-10-CM | POA: Diagnosis present

## 2021-09-02 DIAGNOSIS — Z6834 Body mass index (BMI) 34.0-34.9, adult: Secondary | ICD-10-CM

## 2021-09-02 DIAGNOSIS — E1165 Type 2 diabetes mellitus with hyperglycemia: Secondary | ICD-10-CM | POA: Diagnosis present

## 2021-09-02 DIAGNOSIS — E039 Hypothyroidism, unspecified: Secondary | ICD-10-CM | POA: Diagnosis present

## 2021-09-02 DIAGNOSIS — Z794 Long term (current) use of insulin: Secondary | ICD-10-CM

## 2021-09-02 DIAGNOSIS — Z8249 Family history of ischemic heart disease and other diseases of the circulatory system: Secondary | ICD-10-CM

## 2021-09-02 DIAGNOSIS — I953 Hypotension of hemodialysis: Secondary | ICD-10-CM | POA: Diagnosis present

## 2021-09-02 DIAGNOSIS — M25572 Pain in left ankle and joints of left foot: Secondary | ICD-10-CM | POA: Diagnosis present

## 2021-09-02 DIAGNOSIS — Z992 Dependence on renal dialysis: Secondary | ICD-10-CM

## 2021-09-02 DIAGNOSIS — E785 Hyperlipidemia, unspecified: Secondary | ICD-10-CM | POA: Diagnosis present

## 2021-09-02 DIAGNOSIS — D631 Anemia in chronic kidney disease: Secondary | ICD-10-CM | POA: Diagnosis present

## 2021-09-02 DIAGNOSIS — Z20822 Contact with and (suspected) exposure to covid-19: Secondary | ICD-10-CM | POA: Diagnosis present

## 2021-09-02 DIAGNOSIS — Z885 Allergy status to narcotic agent status: Secondary | ICD-10-CM

## 2021-09-02 DIAGNOSIS — Z66 Do not resuscitate: Secondary | ICD-10-CM | POA: Diagnosis not present

## 2021-09-02 DIAGNOSIS — Z830 Family history of human immunodeficiency virus [HIV] disease: Secondary | ICD-10-CM

## 2021-09-02 DIAGNOSIS — Z8049 Family history of malignant neoplasm of other genital organs: Secondary | ICD-10-CM

## 2021-09-02 DIAGNOSIS — S72401A Unspecified fracture of lower end of right femur, initial encounter for closed fracture: Secondary | ICD-10-CM | POA: Diagnosis present

## 2021-09-02 DIAGNOSIS — Z9049 Acquired absence of other specified parts of digestive tract: Secondary | ICD-10-CM

## 2021-09-02 DIAGNOSIS — Z833 Family history of diabetes mellitus: Secondary | ICD-10-CM

## 2021-09-02 DIAGNOSIS — Z87891 Personal history of nicotine dependence: Secondary | ICD-10-CM

## 2021-09-02 DIAGNOSIS — Z9221 Personal history of antineoplastic chemotherapy: Secondary | ICD-10-CM

## 2021-09-02 DIAGNOSIS — R252 Cramp and spasm: Secondary | ICD-10-CM | POA: Diagnosis present

## 2021-09-02 DIAGNOSIS — W19XXXA Unspecified fall, initial encounter: Principal | ICD-10-CM

## 2021-09-02 DIAGNOSIS — K567 Ileus, unspecified: Secondary | ICD-10-CM | POA: Diagnosis present

## 2021-09-02 DIAGNOSIS — M25561 Pain in right knee: Secondary | ICD-10-CM | POA: Diagnosis present

## 2021-09-02 DIAGNOSIS — Z79899 Other long term (current) drug therapy: Secondary | ICD-10-CM

## 2021-09-02 DIAGNOSIS — I480 Paroxysmal atrial fibrillation: Secondary | ICD-10-CM | POA: Diagnosis present

## 2021-09-02 DIAGNOSIS — K5903 Drug induced constipation: Secondary | ICD-10-CM | POA: Diagnosis not present

## 2021-09-02 DIAGNOSIS — Z8042 Family history of malignant neoplasm of prostate: Secondary | ICD-10-CM

## 2021-09-02 DIAGNOSIS — R52 Pain, unspecified: Secondary | ICD-10-CM

## 2021-09-02 DIAGNOSIS — Z79890 Hormone replacement therapy (postmenopausal): Secondary | ICD-10-CM

## 2021-09-02 DIAGNOSIS — E1151 Type 2 diabetes mellitus with diabetic peripheral angiopathy without gangrene: Secondary | ICD-10-CM | POA: Diagnosis present

## 2021-09-02 DIAGNOSIS — S72421A Displaced fracture of lateral condyle of right femur, initial encounter for closed fracture: Secondary | ICD-10-CM | POA: Diagnosis not present

## 2021-09-02 DIAGNOSIS — Y92009 Unspecified place in unspecified non-institutional (private) residence as the place of occurrence of the external cause: Secondary | ICD-10-CM

## 2021-09-02 DIAGNOSIS — S86911A Strain of unspecified muscle(s) and tendon(s) at lower leg level, right leg, initial encounter: Secondary | ICD-10-CM | POA: Diagnosis present

## 2021-09-02 DIAGNOSIS — M1711 Unilateral primary osteoarthritis, right knee: Secondary | ICD-10-CM | POA: Diagnosis present

## 2021-09-02 DIAGNOSIS — N2581 Secondary hyperparathyroidism of renal origin: Secondary | ICD-10-CM | POA: Diagnosis present

## 2021-09-02 DIAGNOSIS — R109 Unspecified abdominal pain: Secondary | ICD-10-CM | POA: Diagnosis not present

## 2021-09-02 DIAGNOSIS — I350 Nonrheumatic aortic (valve) stenosis: Secondary | ICD-10-CM | POA: Diagnosis present

## 2021-09-02 DIAGNOSIS — K922 Gastrointestinal hemorrhage, unspecified: Secondary | ICD-10-CM | POA: Diagnosis present

## 2021-09-02 DIAGNOSIS — Z85038 Personal history of other malignant neoplasm of large intestine: Secondary | ICD-10-CM

## 2021-09-02 DIAGNOSIS — I252 Old myocardial infarction: Secondary | ICD-10-CM

## 2021-09-02 DIAGNOSIS — K56609 Unspecified intestinal obstruction, unspecified as to partial versus complete obstruction: Secondary | ICD-10-CM | POA: Diagnosis not present

## 2021-09-02 DIAGNOSIS — R262 Difficulty in walking, not elsewhere classified: Secondary | ICD-10-CM | POA: Diagnosis present

## 2021-09-02 DIAGNOSIS — J189 Pneumonia, unspecified organism: Secondary | ICD-10-CM | POA: Diagnosis present

## 2021-09-02 DIAGNOSIS — E1122 Type 2 diabetes mellitus with diabetic chronic kidney disease: Secondary | ICD-10-CM | POA: Diagnosis present

## 2021-09-02 DIAGNOSIS — I272 Pulmonary hypertension, unspecified: Secondary | ICD-10-CM | POA: Diagnosis present

## 2021-09-02 DIAGNOSIS — M25551 Pain in right hip: Secondary | ICD-10-CM | POA: Diagnosis not present

## 2021-09-02 DIAGNOSIS — I469 Cardiac arrest, cause unspecified: Secondary | ICD-10-CM | POA: Diagnosis not present

## 2021-09-02 DIAGNOSIS — G4733 Obstructive sleep apnea (adult) (pediatric): Secondary | ICD-10-CM | POA: Diagnosis present

## 2021-09-02 DIAGNOSIS — I701 Atherosclerosis of renal artery: Secondary | ICD-10-CM | POA: Diagnosis present

## 2021-09-02 DIAGNOSIS — N186 End stage renal disease: Secondary | ICD-10-CM | POA: Diagnosis present

## 2021-09-02 DIAGNOSIS — E739 Lactose intolerance, unspecified: Secondary | ICD-10-CM | POA: Diagnosis present

## 2021-09-02 DIAGNOSIS — W1830XA Fall on same level, unspecified, initial encounter: Secondary | ICD-10-CM | POA: Diagnosis present

## 2021-09-02 DIAGNOSIS — I2510 Atherosclerotic heart disease of native coronary artery without angina pectoris: Secondary | ICD-10-CM | POA: Diagnosis present

## 2021-09-02 DIAGNOSIS — X501XXA Overexertion from prolonged static or awkward postures, initial encounter: Secondary | ICD-10-CM

## 2021-09-02 DIAGNOSIS — E669 Obesity, unspecified: Secondary | ICD-10-CM | POA: Diagnosis present

## 2021-09-02 DIAGNOSIS — Z4659 Encounter for fitting and adjustment of other gastrointestinal appliance and device: Secondary | ICD-10-CM

## 2021-09-02 DIAGNOSIS — E119 Type 2 diabetes mellitus without complications: Secondary | ICD-10-CM

## 2021-09-02 DIAGNOSIS — Z882 Allergy status to sulfonamides status: Secondary | ICD-10-CM

## 2021-09-02 DIAGNOSIS — D469 Myelodysplastic syndrome, unspecified: Secondary | ICD-10-CM | POA: Diagnosis present

## 2021-09-02 DIAGNOSIS — M109 Gout, unspecified: Secondary | ICD-10-CM | POA: Diagnosis present

## 2021-09-02 DIAGNOSIS — R404 Transient alteration of awareness: Secondary | ICD-10-CM | POA: Diagnosis not present

## 2021-09-02 DIAGNOSIS — I5032 Chronic diastolic (congestive) heart failure: Secondary | ICD-10-CM | POA: Diagnosis present

## 2021-09-02 NOTE — ED Triage Notes (Addendum)
Pt brought in by EMS for a fall at home. Pt reports her legs just gave out. Pt states this the third time this has happened. Pt c/o right knee pain and left ankle pain. Pt states she heard her knee pop when she fell. Pt denies hitting her head or LOC. Pt is not on blood thinners. Pt has dialysis M-W-F.

## 2021-09-03 ENCOUNTER — Observation Stay (HOSPITAL_COMMUNITY): Payer: Medicare PPO

## 2021-09-03 ENCOUNTER — Encounter (HOSPITAL_COMMUNITY): Payer: Self-pay | Admitting: Family Medicine

## 2021-09-03 DIAGNOSIS — M25561 Pain in right knee: Secondary | ICD-10-CM | POA: Diagnosis not present

## 2021-09-03 DIAGNOSIS — I5032 Chronic diastolic (congestive) heart failure: Secondary | ICD-10-CM

## 2021-09-03 LAB — CBC WITH DIFFERENTIAL/PLATELET
Abs Immature Granulocytes: 0.04 10*3/uL (ref 0.00–0.07)
Basophils Absolute: 0 10*3/uL (ref 0.0–0.1)
Basophils Relative: 0 %
Eosinophils Absolute: 0 10*3/uL (ref 0.0–0.5)
Eosinophils Relative: 0 %
HCT: 24.1 % — ABNORMAL LOW (ref 36.0–46.0)
Hemoglobin: 8.3 g/dL — ABNORMAL LOW (ref 12.0–15.0)
Immature Granulocytes: 0 %
Lymphocytes Relative: 7 %
Lymphs Abs: 0.7 10*3/uL (ref 0.7–4.0)
MCH: 30.1 pg (ref 26.0–34.0)
MCHC: 34.4 g/dL (ref 30.0–36.0)
MCV: 87.3 fL (ref 80.0–100.0)
Monocytes Absolute: 1.2 10*3/uL — ABNORMAL HIGH (ref 0.1–1.0)
Monocytes Relative: 13 %
Neutro Abs: 7.3 10*3/uL (ref 1.7–7.7)
Neutrophils Relative %: 80 %
Platelets: 143 10*3/uL — ABNORMAL LOW (ref 150–400)
RBC: 2.76 MIL/uL — ABNORMAL LOW (ref 3.87–5.11)
RDW: 18 % — ABNORMAL HIGH (ref 11.5–15.5)
WBC: 9.2 10*3/uL (ref 4.0–10.5)
nRBC: 0 % (ref 0.0–0.2)

## 2021-09-03 LAB — COMPREHENSIVE METABOLIC PANEL
ALT: 38 U/L (ref 0–44)
AST: 55 U/L — ABNORMAL HIGH (ref 15–41)
Albumin: 3.5 g/dL (ref 3.5–5.0)
Alkaline Phosphatase: 121 U/L (ref 38–126)
Anion gap: 17 — ABNORMAL HIGH (ref 5–15)
BUN: 86 mg/dL — ABNORMAL HIGH (ref 8–23)
CO2: 24 mmol/L (ref 22–32)
Calcium: 9.9 mg/dL (ref 8.9–10.3)
Chloride: 100 mmol/L (ref 98–111)
Creatinine, Ser: 8.16 mg/dL — ABNORMAL HIGH (ref 0.44–1.00)
GFR, Estimated: 5 mL/min — ABNORMAL LOW (ref >60)
Glucose, Bld: 200 mg/dL — ABNORMAL HIGH (ref 70–99)
Potassium: 4.2 mmol/L (ref 3.5–5.1)
Sodium: 141 mmol/L (ref 135–145)
Total Bilirubin: 1 mg/dL (ref 0.3–1.2)
Total Protein: 7.1 g/dL (ref 6.5–8.1)

## 2021-09-03 LAB — CBC
HCT: 24.1 % — ABNORMAL LOW (ref 36.0–46.0)
Hemoglobin: 8.5 g/dL — ABNORMAL LOW (ref 12.0–15.0)
MCH: 30.9 pg (ref 26.0–34.0)
MCHC: 35.3 g/dL (ref 30.0–36.0)
MCV: 87.6 fL (ref 80.0–100.0)
Platelets: 140 10*3/uL — ABNORMAL LOW (ref 150–400)
RBC: 2.75 MIL/uL — ABNORMAL LOW (ref 3.87–5.11)
RDW: 18.2 % — ABNORMAL HIGH (ref 11.5–15.5)
WBC: 9.5 10*3/uL (ref 4.0–10.5)
nRBC: 0 % (ref 0.0–0.2)

## 2021-09-03 LAB — RESP PANEL BY RT-PCR (FLU A&B, COVID) ARPGX2
Influenza A by PCR: NEGATIVE
Influenza B by PCR: NEGATIVE
SARS Coronavirus 2 by RT PCR: NEGATIVE

## 2021-09-03 LAB — PROTIME-INR
INR: 1.1 (ref 0.8–1.2)
Prothrombin Time: 14.4 seconds (ref 11.4–15.2)

## 2021-09-03 LAB — GLUCOSE, CAPILLARY
Glucose-Capillary: 162 mg/dL — ABNORMAL HIGH (ref 70–99)
Glucose-Capillary: 101 mg/dL — ABNORMAL HIGH (ref 70–99)

## 2021-09-03 MED ORDER — OXYCODONE-ACETAMINOPHEN 5-325 MG PO TABS: 1.0000 | ORAL_TABLET | ORAL | 0 refills | Status: AC | PRN

## 2021-09-03 MED ORDER — ATORVASTATIN CALCIUM 40 MG PO TABS
40.0000 mg | ORAL_TABLET | Freq: Every day | ORAL | Status: DC
  Administered 2021-09-03 – 2021-09-06 (×5): 40 mg via ORAL
  Filled 2021-09-03 (×5): qty 1

## 2021-09-03 MED ORDER — CARVEDILOL 6.25 MG PO TABS
6.2500 mg | ORAL_TABLET | Freq: Two times a day (BID) | ORAL | Status: DC
  Administered 2021-09-03 – 2021-09-07 (×6): 6.25 mg via ORAL
  Filled 2021-09-03 (×6): qty 1

## 2021-09-03 MED ORDER — DIPHENHYDRAMINE HCL 25 MG PO CAPS: 25.0000 mg | ORAL_CAPSULE | Freq: Once | ORAL | Status: DC

## 2021-09-03 MED ORDER — INSULIN ASPART 100 UNIT/ML IJ SOLN
0.0000 [IU] | Freq: Three times a day (TID) | INTRAMUSCULAR | Status: DC
  Administered 2021-09-03 – 2021-09-06 (×2): 1 [IU] via SUBCUTANEOUS
  Administered 2021-09-06: 3 [IU] via SUBCUTANEOUS
  Administered 2021-09-06: 2 [IU] via SUBCUTANEOUS
  Administered 2021-09-07 (×2): 1 [IU] via SUBCUTANEOUS

## 2021-09-03 MED ORDER — INSULIN GLARGINE-YFGN 100 UNIT/ML ~~LOC~~ SOLN
20.0000 [IU] | Freq: Every day | SUBCUTANEOUS | Status: DC | PRN
  Filled 2021-09-03 (×2): qty 0.2

## 2021-09-03 MED ORDER — LINAGLIPTIN 5 MG PO TABS
5.0000 mg | ORAL_TABLET | Freq: Every day | ORAL | Status: DC
  Administered 2021-09-03 – 2021-09-07 (×5): 5 mg via ORAL
  Filled 2021-09-03 (×5): qty 1

## 2021-09-03 MED ORDER — HYDROMORPHONE HCL 1 MG/ML IJ SOLN
0.5000 mg | INTRAMUSCULAR | Status: DC | PRN
  Administered 2021-09-03 – 2021-09-04 (×7): 0.5 mg via INTRAVENOUS
  Filled 2021-09-03 (×4): qty 0.5
  Filled 2021-09-03: qty 1
  Filled 2021-09-03 (×3): qty 0.5

## 2021-09-03 MED ORDER — HYDROCODONE-ACETAMINOPHEN 5-325 MG PO TABS
2.0000 | ORAL_TABLET | Freq: Once | ORAL | Status: AC
  Administered 2021-09-03: 2 via ORAL
  Filled 2021-09-03: qty 2

## 2021-09-03 MED ORDER — DIPHENHYDRAMINE HCL 25 MG PO CAPS
25.0000 mg | ORAL_CAPSULE | Freq: Four times a day (QID) | ORAL | Status: DC | PRN
  Administered 2021-09-03 – 2021-09-07 (×10): 25 mg via ORAL
  Filled 2021-09-03 (×10): qty 1

## 2021-09-03 MED ORDER — METHOCARBAMOL 1000 MG/10ML IJ SOLN
1000.0000 mg | Freq: Once | INTRAMUSCULAR | Status: DC
Start: 2021-09-03 — End: 2021-09-03

## 2021-09-03 MED ORDER — MIDODRINE HCL 5 MG PO TABS
10.0000 mg | ORAL_TABLET | ORAL | Status: DC
  Administered 2021-09-04 – 2021-09-07 (×3): 10 mg via ORAL
  Filled 2021-09-03 (×2): qty 2

## 2021-09-03 MED ORDER — TRAZODONE HCL 50 MG PO TABS
25.0000 mg | ORAL_TABLET | Freq: Every evening | ORAL | Status: DC | PRN
  Administered 2021-09-03 – 2021-09-04 (×2): 25 mg via ORAL
  Filled 2021-09-03 (×2): qty 1

## 2021-09-03 MED ORDER — SILDENAFIL CITRATE 20 MG PO TABS
20.0000 mg | ORAL_TABLET | Freq: Three times a day (TID) | ORAL | Status: DC
  Administered 2021-09-03 – 2021-09-07 (×13): 20 mg via ORAL
  Filled 2021-09-03 (×18): qty 1

## 2021-09-03 MED ORDER — ALLOPURINOL 300 MG PO TABS
300.0000 mg | ORAL_TABLET | Freq: Every morning | ORAL | Status: DC
  Administered 2021-09-03 – 2021-09-07 (×5): 300 mg via ORAL
  Filled 2021-09-03 (×3): qty 1
  Filled 2021-09-03: qty 3

## 2021-09-03 MED ORDER — OXYCODONE HCL 5 MG PO TABS: 5.0000 mg | ORAL_TABLET | Freq: Four times a day (QID) | ORAL | Status: DC | PRN

## 2021-09-03 MED ORDER — METHOCARBAMOL 1000 MG/10ML IJ SOLN
1000.0000 mg | Freq: Once | INTRAVENOUS | Status: AC
  Administered 2021-09-03: 1000 mg via INTRAVENOUS
  Filled 2021-09-03: qty 10

## 2021-09-03 MED ORDER — AMIODARONE HCL 200 MG PO TABS
200.0000 mg | ORAL_TABLET | Freq: Every day | ORAL | Status: DC
  Administered 2021-09-03 – 2021-09-07 (×5): 200 mg via ORAL
  Filled 2021-09-03 (×5): qty 1

## 2021-09-03 MED ORDER — LATANOPROST 0.005 % OP SOLN
1.0000 [drp] | Freq: Every day | OPHTHALMIC | Status: DC
  Administered 2021-09-03 – 2021-09-07 (×5): 1 [drp] via OPHTHALMIC
  Filled 2021-09-03: qty 2.5

## 2021-09-03 MED ORDER — DIPHENHYDRAMINE HCL 25 MG PO CAPS
25.0000 mg | ORAL_CAPSULE | Freq: Once | ORAL | Status: AC
  Administered 2021-09-03: 25 mg via ORAL
  Filled 2021-09-03: qty 1

## 2021-09-03 MED ORDER — SODIUM CHLORIDE 0.9% FLUSH
3.0000 mL | Freq: Two times a day (BID) | INTRAVENOUS | Status: DC
  Administered 2021-09-03 – 2021-09-07 (×10): 3 mL via INTRAVENOUS

## 2021-09-03 MED ORDER — SODIUM CHLORIDE 0.9% FLUSH: 3.0000 mL | INTRAVENOUS | Status: DC | PRN

## 2021-09-03 MED ORDER — TRAZODONE HCL 50 MG PO TABS: 50.0000 mg | ORAL_TABLET | Freq: Every day | ORAL | Status: DC

## 2021-09-03 MED ORDER — SODIUM CHLORIDE 0.9 % IV SOLN: 250.0000 mL | INTRAVENOUS | Status: DC | PRN

## 2021-09-03 MED ORDER — OXYCODONE HCL 5 MG PO TABS
15.0000 mg | ORAL_TABLET | Freq: Once | ORAL | Status: AC
  Administered 2021-09-03: 15 mg via ORAL
  Filled 2021-09-03: qty 3

## 2021-09-03 MED ORDER — FENTANYL CITRATE PF 50 MCG/ML IJ SOSY
50.0000 ug | PREFILLED_SYRINGE | Freq: Once | INTRAMUSCULAR | Status: AC
  Administered 2021-09-03: 50 ug via INTRAVENOUS
  Filled 2021-09-03: qty 1

## 2021-09-03 MED ORDER — OXYCODONE HCL 5 MG PO TABS
5.0000 mg | ORAL_TABLET | Freq: Four times a day (QID) | ORAL | Status: DC | PRN
  Administered 2021-09-03 – 2021-09-07 (×10): 5 mg via ORAL
  Filled 2021-09-03 (×12): qty 1

## 2021-09-03 MED ORDER — FENTANYL CITRATE PF 50 MCG/ML IJ SOSY: 75.0000 ug | PREFILLED_SYRINGE | Freq: Once | INTRAMUSCULAR | Status: DC

## 2021-09-03 MED ORDER — LEVOTHYROXINE SODIUM 75 MCG PO TABS
175.0000 ug | ORAL_TABLET | Freq: Every day | ORAL | Status: DC
  Administered 2021-09-04 – 2021-09-06 (×3): 175 ug via ORAL
  Filled 2021-09-03 (×3): qty 1

## 2021-09-03 MED ORDER — PANTOPRAZOLE SODIUM 40 MG PO TBEC
40.0000 mg | DELAYED_RELEASE_TABLET | Freq: Every day | ORAL | Status: DC
Start: 2021-09-03 — End: 2021-09-08
  Administered 2021-09-03 – 2021-09-07 (×5): 40 mg via ORAL
  Filled 2021-09-03 (×5): qty 1

## 2021-09-03 MED ORDER — CYCLOBENZAPRINE HCL 10 MG PO TABS: 5.0000 mg | ORAL_TABLET | Freq: Three times a day (TID) | ORAL | Status: DC | PRN

## 2021-09-03 MED ORDER — METHOCARBAMOL 1000 MG/10ML IJ SOLN: 1000.0000 mg | Freq: Once | INTRAMUSCULAR | Status: DC

## 2021-09-03 MED ORDER — CYCLOBENZAPRINE HCL 5 MG PO TABS
5.0000 mg | ORAL_TABLET | Freq: Three times a day (TID) | ORAL | Status: DC | PRN
  Administered 2021-09-03 – 2021-09-04 (×3): 5 mg via ORAL
  Filled 2021-09-03 (×4): qty 1

## 2021-09-03 NOTE — Assessment & Plan Note (Addendum)
66 year old presenting to ED with fall and sudden right knee pain and inability to bear weight.  Imaging studies did not reveal any injuries.  Patient could not ambulate.  Orthopedics was consulted.  MRI was done which reveals a nondisplaced fracture of the superior aspect of the anterolateral femoral condyle extending to the superior margin of the articular surface of the lateral trochlea and the lateral wall of the intercondylar notch.  Further management per orthopedics.  If they think patient needs to undergo surgery then we will need to involve cardiology to provide preoperative assessment and risk stratification. Pain control.

## 2021-09-03 NOTE — Assessment & Plan Note (Signed)
Appears euvolemic Volume control per dialysis Monitor I/O Echo 07/2021: EF of 60-65%, grade 2 DD

## 2021-09-03 NOTE — H&P (Signed)
History and Physical    Patient: Patricia Mata BJY:[id number] DOB: 1955-07-17 DOA: 08/18/2021 DOS: the patient was seen and examined on 09/03/2021 PCP: [name], [place]  Patient coming from: Home - lives with her husband. Independent with ambulation.    Chief Complaint: fall   HPI: Patricia Mata is a 66 y.o. female with medical history significant of aortic stenosis, diastolic CHF, ESRD on dialysis MWF, T2DM, hypothyroidism, HTN, IDA, PAF, hx of colon cancer s/p hemicolectomy and chemo with resultant MDS and transfusion dependent anemia who presented to ED after fall on 09/03/2021. She hurt her right knee and can not walk. She was walking to her living room and almost made it to her chair and her legs gave out. She fell onto her bottom and she heard a popping in both legs, 2 pops in the right leg. She called her husband because she couldn't get up. Her husband was able to get her up in the chair. Her right knee swelled immediately and she had some swelling in her left ankle. She can straighten her right leg out to near 180 degrees, has not tried to flex the knee. She is having severe spasms in her right leg. She is unable to bear weight.   Overall feeling fine. No fever/chills or recent illness. No headaches/dizziness or lightheadedness, no chest pain or palpitations, no shortness of breath or cough, no stomach pain, N/V/D, no dysuria, no leg swelling.    ER Course:  vitals: afebrile, bp: 124/53, HR: 67, RR: 16, oxygen: 96% on RA Pertinent labs: hgb: 8.3 (8.1-9.3), bun: 86, creatinine: 8.16, AG 17 Right knee xray: no acute finding Left ankle: no acute finding. Soft tissue swelling  In ED: given robaxin, ortho called.    Review of Systems: As mentioned in the history of present illness. All other systems reviewed and are negative. Past Medical History:  Diagnosis Date   Acute renal failure (ARF) ([place]) 06/08/2018   TTHS- [place]   Carotid artery disease [facility])    Chronic  diastolic (congestive) heart failure (HCC)    Colon cancer ([place]) 2008   Diabetes mellitus ([place])    Dyspnea    ESRD on hemodialysis ([place])    Gout 09/08/2018   Heart attack ([place])    Heart murmur    Hypertension    Hypothyroidism    Macrocytic anemia    MDS (myelodysplastic syndrome) (HCC)    Moderate mitral stenosis    PAD (peripheral artery disease) (HCC)    PAF (paroxysmal atrial fibrillation) (HCC)    Pulmonary hypertension (HCC)    Renal artery stenosis (HCC)    a. >60% R renal artery stenosis by duplex 2021, managed medically.   Severe aortic stenosis    Transfusion-dependent anemia    Past Surgical History:  Procedure Laterality Date   AV FISTULA PLACEMENT Right 09/26/2020   Procedure: RIGHT ARM ARTERIOVENOUS (AV) FISTULA CREATION;  Surgeon: Waynetta Sandy, MD;  Location: [place];  Service: Vascular;  Laterality: Right;  Monitor Anesthesia Care with Regional Block to Right Arm    [place] Right 12/13/2020   Procedure: RIGHT UPPER EXTREMITY ARTERIOVENOUS FISTULA TRANSPOSITION;  Surgeon: Waynetta Sandy, MD;  Location: [place];  Service: Vascular;  Laterality: Right;   COLON SURGERY     COLONOSCOPY WITH PROPOFOL N/A 02/22/2021   Procedure: COLONOSCOPY WITH PROPOFOL;  Surgeon: Otis Brace, MD;  Location: [place];  Service: Gastroenterology;  Laterality: N/A;   DEBRIDMENT OF DECUBITUS ULCER N/A 06/12/2018   Procedure:  DEBRIDMENT OF DECUBITUS ULCER;  Surgeon: Wallace Going, DO;  Location: [facility];  Service: Plastics;  Laterality: N/A;   ENTEROSCOPY N/A 02/22/2021   Procedure: ENTEROSCOPY;  Surgeon: Otis Brace, MD;  Location: [facility];  Service: Gastroenterology;  Laterality: N/A;   ENTEROSCOPY N/A 05/24/2021   Procedure: ENTEROSCOPY;  Surgeon: Ronnette Juniper, MD;  Location: [place];  Service: Gastroenterology;  Laterality: N/A;   ESOPHAGOGASTRODUODENOSCOPY N/A 09/01/2020   Procedure: ESOPHAGOGASTRODUODENOSCOPY (EGD);   Surgeon: Clarene Essex, MD;  Location: [facility];  Service: Endoscopy;  Laterality: N/A;   GIVENS CAPSULE STUDY N/A 09/01/2020   Procedure: GIVENS CAPSULE STUDY;  Surgeon: Clarene Essex, MD;  Location: [facility];  Service: Endoscopy;  Laterality: N/A;   HEMOSTASIS CLIP PLACEMENT  02/22/2021   Procedure: HEMOSTASIS CLIP PLACEMENT;  Surgeon: Otis Brace, MD;  Location: [place];  Service: Gastroenterology;;   HOT HEMOSTASIS N/A 02/22/2021   Procedure: HOT HEMOSTASIS (ARGON PLASMA COAGULATION/BICAP);  Surgeon: Otis Brace, MD;  Location[facility];  Service: Gastroenterology;  Laterality: N/A;   HOT HEMOSTASIS N/A 05/24/2021   Procedure: HOT HEMOSTASIS (ARGON PLASMA COAGULATION/BICAP);  Surgeon: Ronnette Juniper, MD;  Location: [place];  Service: Gastroenterology;  Laterality: N/A;   IR FLUORO GUIDE CV LINE RIGHT  10/11/2020   IR FLUORO GUIDE PORT INSERTION RIGHT  04/02/2017   IR REMOVAL TUN ACCESS W/ PORT W/O FL MOD SED  03/16/2019   IR US GUIDE VASC ACCESS RIGHT  04/02/2017   IR US GUIDE VASC ACCESS RIGHT  10/11/2020   LIGATION OF COMPETING BRANCHES OF ARTERIOVENOUS FISTULA Right 12/13/2020   Procedure: LIGATION OF COMPETING BRANCHES OF RIGHT UPPER EXTREMITY ARTERIOVENOUS FISTULA;  Surgeon: Waynetta Sandy, MD;  Location: [place];  Service: Vascular;  Laterality: Right;   POLYPECTOMY  02/22/2021   Procedure: POLYPECTOMY;  Surgeon: Otis Brace, MD;  Location: [place] [facility];  Service: Gastroenterology;;   RIGHT HEART CATH N/A 04/23/2019   Procedure: RIGHT HEART CATH;  Surgeon: Jolaine Artist, MD;  Location: [place] [facility];  Service: Cardiovascular;  Laterality: N/A;   RIGHT/LEFT HEART CATH AND CORONARY ANGIOGRAPHY N/A 05/21/2018   Procedure: RIGHT/LEFT HEART CATH AND CORONARY ANGIOGRAPHY;  Surgeon: Nelva Bush, MD;  Location: [place] [facility];  Service: Cardiovascular;  Laterality: N/A;   RIGHT/LEFT HEART CATH AND CORONARY ANGIOGRAPHY N/A 10/13/2020   Procedure:  RIGHT/LEFT HEART CATH AND CORONARY ANGIOGRAPHY;  Surgeon: Jolaine Artist, MD;  Location: [place] [facility];  Service: Cardiovascular;  Laterality: N/A;   TUBAL LIGATION     Social History:  reports that she quit smoking about 26 years ago. Her smoking use included cigarettes. She has a 5.00 pack-year smoking history. She has quit using smokeless tobacco. She reports current alcohol use of about 1.0 standard drink per week. She reports that she does not use drugs.  Allergies  Allergen Reactions   Lactose Intolerance (Gi) Diarrhea   Morphine And Related Itching   Sulfa Antibiotics Rash and Other (See Comments)    Blisters, also    Family History  Problem Relation Age of Onset   Hypertension Mother    Diabetes Mother    Cervical cancer Mother    Heart attack Father    Hypertension Sister    Hypertension Brother    Hypertension Sister    Hypertension Sister    Prostate cancer Brother    HIV/AIDS Brother     Prior to Admission medications   Medication Sig Start Date End Date Taking? Authorizing Provider  Accu-Chek Softclix Lancets  lancets Use to check blood sugar 4 times a day. Dx code E11.65 Patient taking differently: daily. 07/19/21  Yes [name], FNP  acetaminophen (TYLENOL) 500 MG tablet Take 1,000 mg by mouth every 6 (six) hours as needed (pain).   Yes [provider]  allopurinol (ZYLOPRIM) 300 MG tablet Take 300 mg by mouth every morning. 07/05/20  Yes [provider]  amiodarone (PACERONE) 200 MG tablet Take 1 tablet by mouth once daily Patient taking differently: Take 200 mg by mouth daily. Take 1 tablet by mouth once daily 05/07/21  Yes Fenton, Clint R, PA  atorvastatin (LIPITOR) 40 MG tablet Take 1 tablet by mouth once daily Patient taking differently: Take 40 mg by mouth daily. 08/27/21  Yes [name], FNP  Blood Glucose Monitoring Suppl (ACCU-CHEK GUIDE ME) w/Device KIT Use to check blood sugar 4 times a day. Dx code E11.65 07/19/21  Yes  [name], FNP  carvedilol (COREG) 6.25 MG tablet TAKE 1 TABLET BY MOUTH TWICE DAILY WITH A MEAL Patient taking differently: Take 6.25 mg by mouth 2 (two) times daily with a meal. 07/18/21  Yes [name], FNP  colchicine 0.6 MG tablet Take 0.6 mg by mouth daily as needed (gout flares).   Yes [provider]  diphenhydrAMINE (BENADRYL) 25 MG tablet Take 25 mg by mouth every 8 (eight) hours as needed for itching.   Yes [provider]  ethyl chloride spray Apply 1 application topically See admin instructions. Apply topically to dialysis site if there is no time to use emla cream 03/01/21  Yes [provider]  glucose blood (ACCU-CHEK GUIDE) test strip Use to check blood sugar 4 times a day. Dx code E11.65 Patient taking differently: daily. 07/19/21  Yes [name], FNP  Insulin Glargine (BASAGLAR KWIKPEN) 100 UNIT/ML Inject 20 Units into the skin daily as needed (only if blood sugar over 150).   Yes [provider]  Insulin Pen Needle 29G X 5MM MISC Use as directed 06/16/17  Yes Bonnielee Haff, MD  latanoprost (XALATAN) 0.005 % ophthalmic solution Place 1 drop into both eyes at bedtime.    Yes [provider]  levothyroxine (SYNTHROID) 175 MCG tablet Take 1 tablet (175 mcg total) by mouth daily before breakfast. 06/18/21  Yes [name], FNP  lidocaine-prilocaine (EMLA) cream Apply 1 application topically See admin instructions. Apply to dialysis site 30 minutes before arriving at dialysis on  Monday,Wednesday,friday 01/23/21  Yes [provider]  loratadine (CLARITIN) 10 MG tablet Take 1 tablet (10 mg total) by mouth daily as needed for allergies. 06/04/21 06/04/22 Yes [name], FNP  Methoxy PEG-Epoetin Beta (MIRCERA IJ) Mircera 05/03/21 05/02/22 Yes [provider]  midodrine (PROAMATINE) 10 MG tablet Take 10 mg by mouth See admin instructions. Dialysis days Monday,Wednesday,friday 08/13/21  Yes [provider]   Multiple Vitamin (MULTIVITAMIN WITH MINERALS) TABS tablet Take 1 tablet by mouth every morning. Centrum Silver 50+   Yes [provider]  oxyCODONE-acetaminophen (PERCOCET) 5-325 MG tablet Take 1 tablet by mouth every 4 (four) hours as needed. 09/03/21  Yes Montine Circle, PA-C  pantoprazole (PROTONIX) 40 MG tablet Take 1 tablet (40 mg total) by mouth daily. 05/26/21  Yes Shawna Clamp, MD  polyethylene glycol (MIRALAX / GLYCOLAX) 17 g packet Take 17 g by mouth daily as needed for moderate constipation. 01/14/21  Yes Vann, Jessica U, DO  sildenafil (REVATIO) 20 MG tablet TAKE 1 TABLET BY MOUTH THREE TIMES DAILY Patient taking differently: Take 20 mg by  mouth 3 (three) times daily. 07/24/21  Yes Bensimhon, Shaune Pascal, MD  TRADJENTA 5 MG TABS tablet Take 5 mg by mouth daily. 07/18/21  Yes [provider]  blood glucose meter kit and supplies Dispense based on patient and insurance preference. Use up to four times daily as directed. (FOR ICD-10 E10.9, E11.9). Patient taking differently: daily. 11/04/18   [name], FNP  [facility] ULTRA test strip CHECK BLOOD SUGARS TWICE DAILY 05/28/21   [name], FNP    Physical Exam: Vitals:   09/03/21 0915 09/03/21 1100 09/03/21 1145 09/03/21 1300  BP: (!) 168/48 (!) 161/44 (!) 163/44 (!) 163/41  Pulse: 75 79 91 89  Resp: '14 16 17 17  ' Temp:  98 F (36.7 C)    TempSrc:      SpO2: 98% 99% 94% 95%  Weight:      Height:       General:  Appears calm and comfortable and is in NAD Eyes:  PERRL, EOMI, normal lids, iris ENT:  grossly normal hearing, lips & tongue, mmm; appropriate dentition Neck:  no LAD, masses or thyromegaly; no carotid bruits Cardiovascular:  RRR, harsh systolic murmur. Trace LE edema. Left ankle edema.  Respiratory:   CTA bilaterally with no wheezes/rales/rhonchi.  Normal respiratory effort. Abdomen:  soft, NT, ND, NABS Back:   normal alignment, no CVAT Skin:  no rash or induration seen on limited exam. AV fistula in  left BCA.  Musculoskeletal:  limited exam. Refusing knee exam.  Lower extremity:    Limited foot exam with no ulcerations.  2+ distal pulses. Psychiatric:  grossly normal mood and affect, speech fluent and appropriate, AOx3 Neurologic:  CN 2-12 grossly intact, moves all extremities in coordinated fashion, sensation intact   Radiological Exams on Admission: Independently reviewed - see discussion in A/P where applicable  DG Knee 2 Views Right  Result Date: 09/05/2021 CLINICAL DATA:  Recent fall with right knee pain, initial encounter EXAM: RIGHT KNEE - 1-2 VIEW COMPARISON:  None. FINDINGS: Mild medial joint space narrowing is noted. No acute fracture or dislocation is noted. Vascular calcifications are noted as well as soft tissue calcifications. IMPRESSION: Degenerative change without acute abnormality. Electronically Signed   By: Inez Catalina M.D.   On: 08/21/2021 23:41   DG Ankle 2 Views Left  Result Date: 08/26/2021 CLINICAL DATA:  Recent fall with ankle pain, initial encounter EXAM: LEFT ANKLE - 2 VIEW COMPARISON:  None. FINDINGS: Generalized soft tissue swelling is noted about the ankle. Calcaneal spurring is seen. Tarsal degenerative changes are noted. No acute fracture or dislocation is noted. Vascular calcifications are noted. IMPRESSION: Soft tissue swelling without acute bony abnormality. Electronically Signed   By: Inez Catalina M.D.   On: 08/20/2021 23:44    EKG: pending for today   Labs on Admission: I have personally reviewed the available labs and imaging studies at the time of the admission.  Pertinent labs:   hgb: 8.3 (8.1-9.3), transfuse to keep > 8 bun: 86,  creatinine: 8.16,  AG 17  Assessment and Plan: * Acute pain of right knee- (present on admission) 66 year old presenting to ED with fall and sudden right knee pain and inability to bear weight -observation to telemetry -refusing to let me perform exam -ortho has been consulted -history suspicious for meniscal  injury, but main pain seems to be from intense muscle cramping -robaxin has given her no relief, will try lower dose flexeril at 82m TID  -MRI per ortho -PT has already been  ordered -pain med with oxycodone prn and dilaudid as her pain is not controlled with oral pain meds    ESRD (end stage renal disease) ([place])- (present on admission) Dialysis MWF, last full session on Friday Nephrology consulted: Dr. 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  Red cell transfusion to keep hemoglobin above 8. hgb of 8.3, will repeat cbc at 1800 today  Type and screen, will likely need transfused while here   Aortic stenosis, severe Echo 07/2021: There is severe calcifcation of the aortic valve. There is severe thickening of the aortic valve. Aortic valve regurgitation is mild. Severe aortic valve stenosis.  Followed by Dr. Burt Knack, severe but stable AS Per  His note in 07/2021: comorbid medical conditions remain prohibitive for consideration of TAVR  DM2 (diabetes mellitus, type 2) ([place]) a1c of 5.9 two months ago.  Well controlled, continue home long acting insulin at 20 units prn sugar over 150 and tradjenta SSI and accuchecks per protocol.   Paroxysmal atrial fibrillation (HCC)- (present on admission) Telemetry  Continue amiodarone and coreg No anticoagulation with transfusion dependent anemia  Pulmonary hypertension ([place])- (present on admission) Continue revatio   Hyperlipidemia- (present on admission) Continue lipitor   Hypothyroidism- (present on admission) TSH wnl 07/2021 Continue home  synthroid at 114mg   Gout- (present on admission) No evidence of flair Continue allopurinol     Advance Care Planning:   Code Status: Full Code   Consults: ortho, nephrology: Dr. SJonnie Finner  DVT Prophylaxis: SCDs   Family Communication: none   Severity of Illness: The appropriate patient status for this patient is OBSERVATION. Observation status is judged to be reasonable and necessary in order to provide the required intensity of service to ensure the patient's safety. The patient's presenting symptoms, physical exam findings, and initial radiographic and laboratory data in the context of their medical condition is felt to place them at decreased risk for further clinical deterioration. Furthermore, it is anticipated that the patient will be medically stable for discharge from the hospital within 2 midnights of admission.   Author: AOrma Flaming MD 09/03/2021 1:59 PM  For on call review [email or web address]

## 2021-09-03 NOTE — ED Notes (Signed)
Pt is due for HD today as her schedule is MWF; this RN sent secure chat to Dr Jeanell Sparrow re: need for nephro consult to arrange HD treatment while pt waits for dispo/placement. Awaiting response.

## 2021-09-03 NOTE — Progress Notes (Signed)
Orthopedic Tech Progress Note Patient Details:  Patricia Mata 1956/02/25 [id number]  Ortho Devices Type of Ortho Device: Knee Immobilizer, ASO Ortho Device/Splint Location: lle aso, rle KI Ortho Device/Splint Interventions: Ordered, Application, Adjustment  After applying the braces I did the crutch training and I attempted to get the patient up to walk so she could go home. She started having cramps in her right knee  and was unable to stand at all. I alerted the Rn to this and then we put the patient back in the bed. Post Interventions Patient Tolerated: Well Instructions Provided: Care of device, Adjustment of device  Karolee Stamps 09/03/2021, 3:38 AM

## 2021-09-03 NOTE — Consult Note (Signed)
Reason for Consult:Right knee pain Referring Physician: [name] Time called: 0623 Time at bedside: Patricia Mata is an 66 y.o. female.  HPI: Patricia Mata was at home and fell. She's not sure what caused the fall and landed on her bottom. She somehow twisted her knee on the way down and had significant pain and was unable to bear weight. She was brought to the ED where workup was negative except for a significant effusion. As she was in too much pain to be discharged the plan was for admission for pain control and mobilization and orthopedic surgery was asked to evaluate. She is having most of her pain through muscular spasms.  Past Medical History:  Diagnosis Date   Acute renal failure (ARF) ([place]) 06/08/2018   TTHS- [place]   Carotid artery disease [facility])    Chronic diastolic (congestive) heart failure (HCC)    Colon cancer ([place]) 2008   Diabetes mellitus ([place])    Dyspnea    ESRD on hemodialysis ([place])    Gout 09/08/2018   Heart attack ([place])    Heart murmur    Hypertension    Hypothyroidism    Macrocytic anemia    MDS (myelodysplastic syndrome) (HCC)    Moderate mitral stenosis    PAD (peripheral artery disease) (HCC)    PAF (paroxysmal atrial fibrillation) (HCC)    Pulmonary hypertension (HCC)    Renal artery stenosis (HCC)    a. >60% R renal artery stenosis by duplex 2021, managed medically.   Severe aortic stenosis    Transfusion-dependent anemia     Past Surgical History:  Procedure Laterality Date   AV FISTULA PLACEMENT Right 09/26/2020   Procedure: RIGHT ARM ARTERIOVENOUS (AV) FISTULA CREATION;  Surgeon: Waynetta Sandy, MD;  Location: [facility];  Service: Vascular;  Laterality: Right;  Monitor Anesthesia Care with Regional Block to Right Arm    [place] Right 12/13/2020   Procedure: RIGHT UPPER EXTREMITY ARTERIOVENOUS FISTULA TRANSPOSITION;  Surgeon: Waynetta Sandy, MD;  Location: [facility];  Service: Vascular;  Laterality:  Right;   COLON SURGERY     COLONOSCOPY WITH PROPOFOL N/A 02/22/2021   Procedure: COLONOSCOPY WITH PROPOFOL;  Surgeon: Otis Brace, MD;  Location: [facility];  Service: Gastroenterology;  Laterality: N/A;   DEBRIDMENT OF DECUBITUS ULCER N/A 06/12/2018   Procedure: DEBRIDMENT OF DECUBITUS ULCER;  Surgeon: Wallace Going, DO;  Location: [facility];  Service: Plastics;  Laterality: N/A;   ENTEROSCOPY N/A 02/22/2021   Procedure: ENTEROSCOPY;  Surgeon: Otis Brace, MD;  [facility];  Service: Gastroenterology;  Laterality: N/A;   ENTEROSCOPY N/A 05/24/2021   Procedure: ENTEROSCOPY;  Surgeon: Ronnette Juniper, MD;  Location: [facility];  Service: Gastroenterology;  Laterality: N/A;   ESOPHAGOGASTRODUODENOSCOPY N/A 09/01/2020   Procedure: ESOPHAGOGASTRODUODENOSCOPY (EGD);  Surgeon: Clarene Essex, MD;  [facility];  Service: Endoscopy;  Laterality: N/A;   GIVENS CAPSULE STUDY N/A 09/01/2020   Procedure: GIVENS CAPSULE STUDY;  Surgeon: Clarene Essex, MD;  [facility];  Service: Endoscopy;  Laterality: N/A;   HEMOSTASIS CLIP PLACEMENT  02/22/2021   Procedure: HEMOSTASIS CLIP PLACEMENT;  Surgeon: Otis Brace, MD;  Location: [facility];  Service: Gastroenterology;;   HOT HEMOSTASIS N/A 02/22/2021   Procedure: HOT HEMOSTASIS (ARGON PLASMA COAGULATION/BICAP);  Surgeon: Otis Brace, MD;  Location: [facility];  Service: Gastroenterology;  Laterality: N/A;   HOT HEMOSTASIS N/A 05/24/2021   Procedure: HOT HEMOSTASIS (ARGON PLASMA COAGULATION/BICAP);  Surgeon: Ronnette Juniper, MD;  Location: [facility];  Service: Gastroenterology;  Laterality: N/A;   IR FLUORO GUIDE CV LINE RIGHT  10/11/2020   IR FLUORO GUIDE PORT INSERTION RIGHT  04/02/2017   IR REMOVAL TUN ACCESS W/ PORT W/O FL MOD SED  03/16/2019   IR US GUIDE VASC ACCESS RIGHT  04/02/2017   IR US GUIDE VASC ACCESS RIGHT  10/11/2020   LIGATION OF COMPETING BRANCHES OF ARTERIOVENOUS FISTULA Right 12/13/2020    Procedure: LIGATION OF COMPETING BRANCHES OF RIGHT UPPER EXTREMITY ARTERIOVENOUS FISTULA;  Surgeon: Waynetta Sandy, MD;  Location: [facility];  Service: Vascular;  Laterality: Right;   POLYPECTOMY  02/22/2021   Procedure: POLYPECTOMY;  Surgeon: Otis Brace, MD;  Location: [facility];  Service: Gastroenterology;;   RIGHT HEART CATH N/A 04/23/2019   Procedure: RIGHT HEART CATH;  Surgeon: Jolaine Artist, MD;  Location: [facility];  Service: Cardiovascular;  Laterality: N/A;   RIGHT/LEFT HEART CATH AND CORONARY ANGIOGRAPHY N/A 05/21/2018   Procedure: RIGHT/LEFT HEART CATH AND CORONARY ANGIOGRAPHY;  Surgeon: Nelva Bush, MD;  Location: [facility];  Service: Cardiovascular;  Laterality: N/A;   RIGHT/LEFT HEART CATH AND CORONARY ANGIOGRAPHY N/A 10/13/2020   Procedure: RIGHT/LEFT HEART CATH AND CORONARY ANGIOGRAPHY;  Surgeon: Jolaine Artist, MD;  Location: [facility];  Service: Cardiovascular;  Laterality: N/A;   TUBAL LIGATION      Family History  Problem Relation Age of Onset   Hypertension Mother    Diabetes Mother    Cervical cancer Mother    Heart attack Father    Hypertension Sister    Hypertension Brother    Hypertension Sister    Hypertension Sister    Prostate cancer Brother    HIV/AIDS Brother     Social History:  reports that she quit smoking about 26 years ago. Her smoking use included cigarettes. She has a 5.00 pack-year smoking history. She has quit using smokeless tobacco. She reports current alcohol use of about 1.0 standard drink per week. She reports that she does not use drugs.  Allergies:  Allergies  Allergen Reactions   Lactose Intolerance (Gi) Diarrhea   Morphine And Related Itching   Sulfa Antibiotics Rash and Other (See Comments)    Blisters, also    Medications: I have reviewed the patient's current medications.  Results for orders placed or performed during the hospital encounter of 08/27/2021 (from the past 48  hour(s))  Resp Panel by RT-PCR (Flu A&B, Covid) Nasopharyngeal Swab     Status: None   Collection Time: 09/03/21  7:25 AM   Specimen: Nasopharyngeal Swab; Nasopharyngeal(NP) swabs in vial transport medium  Result Value Ref Range   SARS Coronavirus 2 by RT PCR NEGATIVE NEGATIVE    Comment: (NOTE) SARS-CoV-2 target nucleic acids are NOT DETECTED.  The SARS-CoV-2 RNA is generally detectable in upper respiratory specimens during the acute phase of infection. The lowest concentration of SARS-CoV-2 viral copies this assay can detect is 138 copies/mL. A negative result does not preclude SARS-Cov-2 infection and should not be used as the sole basis for treatment or other patient management decisions. A negative result may occur with  improper specimen collection/handling, submission of specimen other than nasopharyngeal swab, presence of viral mutation(s) within the areas targeted by this assay, and inadequate number of viral copies(<138 copies/mL). A negative result must be combined with clinical observations, patient history, and epidemiological information. The expected result is Negative.  Fact Sheet for Patients:  EntrepreneurPulse.com.au  Fact Sheet for Healthcare Providers:  IncredibleEmployment.be  This test  is no t yet approved or cleared by the [place] and  has been authorized for detection and/or diagnosis of SARS-CoV-2 by FDA under an Emergency Use Authorization (EUA). This EUA will remain  in effect (meaning this test can be used) for the duration of the COVID-19 declaration under Section 564(b)(1) of the Act, 21 U.S.C.section 360bbb-3(b)(1), unless the authorization is terminated  or revoked sooner.       Influenza A by PCR NEGATIVE NEGATIVE   Influenza B by PCR NEGATIVE NEGATIVE    Comment: (NOTE) The Xpert Xpress SARS-CoV-2/FLU/RSV plus assay is intended as an aid in the diagnosis of influenza from Nasopharyngeal swab  specimens and should not be used as a sole basis for treatment. Nasal washings and aspirates are unacceptable for Xpert Xpress SARS-CoV-2/FLU/RSV testing.  Fact Sheet for Patients: EntrepreneurPulse.com.au  Fact Sheet for Healthcare Providers: IncredibleEmployment.be  This test is not yet approved or cleared by the [place] FDA and has been authorized for detection and/or diagnosis of SARS-CoV-2 by FDA under an Emergency Use Authorization (EUA). This EUA will remain in effect (meaning this test can be used) for the duration of the COVID-19 declaration under Section 564(b)(1) of the Act, 21 U.S.C. section 360bbb-3(b)(1), unless the authorization is terminated or revoked.  Performed at [facility], [place] [address] [place], [place] [address]   CBC with Differential/Platelet     Status: Abnormal   Collection Time: 09/03/21  7:35 AM  Result Value Ref Range   WBC 9.2 4.0 - 10.5 K/uL   RBC 2.76 (L) 3.87 - 5.11 MIL/uL   Hemoglobin 8.3 (L) 12.0 - 15.0 g/dL   HCT 24.1 (L) 36.0 - 46.0 %   MCV 87.3 80.0 - 100.0 fL   MCH 30.1 26.0 - 34.0 pg   MCHC 34.4 30.0 - 36.0 g/dL   RDW 18.0 (H) 11.5 - 15.5 %   Platelets 143 (L) 150 - 400 K/uL   nRBC 0.0 0.0 - 0.2 %   Neutrophils Relative % 80 %   Neutro Abs 7.3 1.7 - 7.7 K/uL   Lymphocytes Relative 7 %   Lymphs Abs 0.7 0.7 - 4.0 K/uL   Monocytes Relative 13 %   Monocytes Absolute 1.2 (H) 0.1 - 1.0 K/uL   Eosinophils Relative 0 %   Eosinophils Absolute 0.0 0.0 - 0.5 K/uL   Basophils Relative 0 %   Basophils Absolute 0.0 0.0 - 0.1 K/uL   Immature Granulocytes 0 %   Abs Immature Granulocytes 0.04 0.00 - 0.07 K/uL    Comment: Performed at [facility], [place] [address], [place], [place] [address]  Comprehensive metabolic panel     Status: Abnormal   Collection Time: 09/03/21  7:35 AM  Result Value Ref Range   Sodium 141 135 - 145 mmol/L   Potassium 4.2 3.5 - 5.1 mmol/L   Chloride 100 98 -  111 mmol/L   CO2 24 22 - 32 mmol/L   Glucose, Bld 200 (H) 70 - 99 mg/dL    Comment: Glucose reference range applies only to samples taken after fasting for at least 8 hours.   BUN 86 (H) 8 - 23 mg/dL   Creatinine, Ser 8.16 (H) 0.44 - 1.00 mg/dL   Calcium 9.9 8.9 - 10.3 mg/dL   Total Protein 7.1 6.5 - 8.1 g/dL   Albumin 3.5 3.5 - 5.0 g/dL   AST 55 (H) 15 - 41 U/L   ALT 38 0 - 44 U/L   Alkaline  Phosphatase 121 38 - 126 U/L   Total Bilirubin 1.0 0.3 - 1.2 mg/dL   GFR, Estimated 5 (L) >60 mL/min    Comment: (NOTE) Calculated using the CKD-EPI Creatinine Equation (2021)    Anion gap 17 (H) 5 - 15    Comment: Performed at [facility] [address], [place], [place] [address]   *Note: Due to a large number of results and/or encounters for the requested time period, some results have not been displayed. A complete set of results can be found in Results Review.    DG Knee 2 Views Right  Result Date: 08/31/2021 CLINICAL DATA:  Recent fall with right knee pain, initial encounter EXAM: RIGHT KNEE - 1-2 VIEW COMPARISON:  None. FINDINGS: Mild medial joint space narrowing is noted. No acute fracture or dislocation is noted. Vascular calcifications are noted as well as soft tissue calcifications. IMPRESSION: Degenerative change without acute abnormality. Electronically Signed   By: Inez Catalina M.D.   On: 08/30/2021 23:41   DG Ankle 2 Views Left  Result Date: 08/18/2021 CLINICAL DATA:  Recent fall with ankle pain, initial encounter EXAM: LEFT ANKLE - 2 VIEW COMPARISON:  None. FINDINGS: Generalized soft tissue swelling is noted about the ankle. Calcaneal spurring is seen. Tarsal degenerative changes are noted. No acute fracture or dislocation is noted. Vascular calcifications are noted. IMPRESSION: Soft tissue swelling without acute bony abnormality. Electronically Signed   By: Inez Catalina M.D.   On: 08/17/2021 23:44    Review of Systems  HENT:  Negative for ear discharge, ear pain, hearing  loss and tinnitus.   Eyes:  Negative for photophobia and pain.  Respiratory:  Negative for cough and shortness of breath.   Cardiovascular:  Negative for chest pain.  Gastrointestinal:  Negative for abdominal pain, nausea and vomiting.  Genitourinary:  Negative for dysuria, flank pain, frequency and urgency.  Musculoskeletal:  Positive for arthralgias (Right knee). Negative for back pain, myalgias and neck pain.  Neurological:  Negative for dizziness and headaches.  Hematological:  Does not bruise/bleed easily.  Psychiatric/Behavioral:  The patient is not nervous/anxious.   Blood pressure (!) 163/44, pulse 91, temperature 98 F (36.7 C), resp. rate 17, height 5\' 4"  (1.626 m), weight 87.1 kg, SpO2 94 %. Physical Exam Constitutional:      General: She is not in acute distress.    Appearance: She is well-developed. She is not diaphoretic.  HENT:     Head: Normocephalic and atraumatic.  Eyes:     General: No scleral icterus.       Right eye: No discharge.        Left eye: No discharge.     Conjunctiva/sclera: Conjunctivae normal.  Cardiovascular:     Rate and Rhythm: Normal rate and regular rhythm.  Pulmonary:     Effort: Pulmonary effort is normal. No respiratory distress.  Musculoskeletal:     Cervical back: Normal range of motion.     Comments: RLE No traumatic wounds, ecchymosis, or rash  KI in place, knee exquisitely TTP  Mod knee effusion  Compartments thigh/lower leg soft  Sens DPN, SPN, TN intact  Motor EHL, ext, flex, evers 5/5  DP 1+, PT 0, No significant edema  Skin:    General: Skin is warm and dry.  Neurological:     Mental Status: She is alert.  Psychiatric:        Mood and Affect: Mood normal.        Behavior: Behavior normal.  Assessment/Plan: Right knee pain -- No obvious diagnosis at this stage. Will get MRI to r/o occult fx/internal damage. Would continue NWB, KI for now.    Lisette Abu, PA-C Orthopedic Surgery [phone] 09/03/2021, 1:01  PM

## 2021-09-03 NOTE — ED Notes (Signed)
Dr Jeanell Sparrow at bedside with this RN assessing pt at this time.

## 2021-09-03 NOTE — NC FL2 (Addendum)
[place] MEDICAID FL2 LEVEL OF CARE SCREENING TOOL     IDENTIFICATION  Patient Name: Patricia Mata Birthdate: 1955/11/12 Sex: female Admission Date (Current Location): 08/28/2021  [facility] [place] Number:  Herbalist and Address:  [facility], [place] [address], [place], [place] [address]      Provider Number: [id number]  Attending Physician Name and Address:  Orma Flaming, MD  Relative Name and Phone Number:  Aedyn Mckeon, Husband, [phone]    Current Level of Care: Hospital Recommended Level of Care: [place] Prior Approval Number:    Date Approved/Denied:   PASRR Number: [id number]  Discharge Plan: SNF    Current Diagnoses: Patient Active Problem List   Diagnosis Date Noted   Acute pain of right knee 09/03/2021   Chronic diastolic CHF (congestive heart failure) ([place]) 09/03/2021   Acute blood loss anemia 05/24/2021   Thrombocytopenia ([place]) 05/13/2021   Secondary hypercoagulable state ([place]) 05/07/2021   Atrial fibrillation with RVR ([place]) 04/21/2021   Anemia 01/13/2021   Secondary hyperparathyroidism of renal origin ([place]) 01/07/2021   Obesity 11/23/2020   Joint pain 11/23/2020   Other long term (current) drug therapy 11/23/2020   Primary osteoarthritis 11/23/2020   ESRD (end stage renal disease) ([place]) 11/22/2020   Nonrheumatic mitral (valve) stenosis 10/17/2020   Coagulation defect, unspecified ([place]) 38/18/2993   Complication of vascular dialysis catheter 10/14/2020   Peripheral vascular disease ([place]) 10/14/2020   Angina pectoris ([place]) 10/09/2020   Angina at rest [facility]) 10/09/2020   Gastroesophageal reflux disease 09/12/2020   History of malignant neoplasm of colon 09/12/2020   Chronic gastritis 09/02/2020   Symptomatic anemia 08/31/2020   Abnormal uterine bleeding (AUB) 07/19/2020   Vaginal ulceration 07/19/2020   Palliative care by specialist    Acute on chronic diastolic CHF (congestive heart  failure) ([place]) 06/07/2020   Chronic cough 05/04/2020   Allergic rhinitis 03/23/2020   Chronic kidney disease, stage 5 ([place]) 71/69/6789   Acute diastolic CHF (congestive heart failure) ([place]) 09/21/2019   HCAP (healthcare-associated pneumonia) 09/20/2019   Pulmonary hypertension ([place]) 09/20/2019   DM2 (diabetes mellitus, type 2) ([place]) 09/20/2019   Elevated troponin 09/20/2019   CAP (community acquired pneumonia) 09/14/2019   Epistaxis, recurrent 08/10/2019   Neutropenia ([place]) 05/25/2019   Malignant neoplasm of colon ([place]) 05/25/2019   Bradycardia 03/21/2019   Type 2 diabetes mellitus with hemoglobin A1c goal of less than 7.0% ([place]) 12/10/2018   Hypothyroidism 12/10/2018   Seasonal allergies 12/10/2018   Gout 09/08/2018   Bacterial infection due to Morganella morganii    Sacral wound 06/08/2018   Iron deficiency anemia due to chronic blood loss 06/08/2018   Hyponatremia 06/08/2018   Constipation 06/08/2018   Encounter for nasogastric (NG) tube placement    Pressure injury of skin 05/22/2018   Paroxysmal atrial fibrillation ([place]) 05/11/2018   Normocytic anemia 06/14/2017   Aortic stenosis, severe 06/10/2017   Carotid artery disease ([place]) 06/10/2017   Essential hypertension 05/13/2017   Hyperlipidemia 05/13/2017   Bilateral lower extremity edema 05/13/2017   Port-A-Cath in place 04/28/2017   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  03/20/2017   Goals of care, counseling/discussion 03/20/2017   Anemia in chronic kidney disease 05/10/2016   Anemia in stage 1 chronic kidney disease 05/10/2016    Orientation RESPIRATION BLADDER Height & Weight     Self, Time, Situation, Place  Normal Continent Weight: 192 lb (87.1 kg) Height:  _0  (162.6 cm)  BEHAVIORAL SYMPTOMS/MOOD NEUROLOGICAL BOWEL  NUTRITION STATUS      Continent Diet (Salt free)  AMBULATORY STATUS COMMUNICATION OF NEEDS Skin   Extensive Assist Verbally Normal                       Personal  Care Assistance Level of Assistance  Bathing, Feeding, Dressing Bathing Assistance: Maximum assistance Feeding assistance: Independent Dressing Assistance: Maximum assistance     Functional Limitations Info  Sight, Hearing, Speech Sight Info: Impaired (Wears glasses) Hearing Info: Adequate Speech Info: Adequate    SPECIAL CARE FACTORS FREQUENCY  PT (By licensed PT)     PT Frequency: Min 2x Weekly              Contractures Contractures Info: Not present    Additional Factors Info  Code Status, Allergies Code Status Info: Full Allergies Info: Lactose Intolerance (gi), Morphine And Related, Sulfa Antibiotics           Current Medications (09/03/2021):  This is the current hospital active medication list Current Facility-Administered Medications  Medication Dose Route Frequency Provider Last Rate Last Admin   0.9 %  sodium chloride infusion  250 mL Intravenous PRN Orma Flaming, MD       allopurinol (ZYLOPRIM) tablet 300 mg  300 mg Oral q morning Orma Flaming, MD       amiodarone (PACERONE) tablet 200 mg  200 mg Oral Daily Orma Flaming, MD       atorvastatin (LIPITOR) tablet 40 mg  40 mg Oral Daily Orma Flaming, MD       Basaglar KwikPen KwikPen 20 Units  20 Units Subcutaneous Daily PRN Orma Flaming, MD       carvedilol (COREG) tablet 6.25 mg  6.25 mg Oral BID WC Orma Flaming, MD       cyclobenzaprine (FLEXERIL) tablet 5 mg  5 mg Oral TID PRN Orma Flaming, MD       diphenhydrAMINE (BENADRYL) capsule 25 mg  25 mg Oral Q6H PRN Orma Flaming, MD       HYDROmorphone (DILAUDID) injection 0.5 mg  0.5 mg Intravenous Q3H PRN Orma Flaming, MD       insulin aspart (novoLOG) injection 0-6 Units  0-6 Units Subcutaneous TID WC Orma Flaming, MD       latanoprost (XALATAN) 0.005 % ophthalmic solution 1 drop  1 drop Both Eyes QHS Orma Flaming, MD       Derrill Memo ON 09/04/2021] levothyroxine (SYNTHROID) tablet 175 mcg  175 mcg Oral QAC breakfast Orma Flaming, MD        linagliptin (TRADJENTA) tablet 5 mg  5 mg Oral Daily Orma Flaming, MD       midodrine (PROAMATINE) tablet 10 mg  10 mg Oral See admin instructions Orma Flaming, MD       oxyCODONE (Oxy IR/ROXICODONE) immediate release tablet 5 mg  5 mg Oral Q6H PRN Orma Flaming, MD       pantoprazole (PROTONIX) EC tablet 40 mg  40 mg Oral Daily Orma Flaming, MD       sildenafil (REVATIO) tablet 20 mg  20 mg Oral TID Orma Flaming, MD       sodium chloride flush (NS) 0.9 % injection 3 mL  3 mL Intravenous Q12H Orma Flaming, MD       sodium chloride flush (NS) 0.9 % injection 3 mL  3 mL Intravenous PRN Orma Flaming, MD       traZODone (DESYREL) tablet 25 mg  25 mg Oral QHS PRN Orma Flaming, MD  Current Outpatient Medications  Medication Sig Dispense Refill   Accu-Chek Softclix Lancets lancets Use to check blood sugar 4 times a day. Dx code E11.65 (Patient taking differently: daily.) 400 each 12   acetaminophen (TYLENOL) 500 MG tablet Take 1,000 mg by mouth every 6 (six) hours as needed (pain).     allopurinol (ZYLOPRIM) 300 MG tablet Take 300 mg by mouth every morning.     amiodarone (PACERONE) 200 MG tablet Take 1 tablet by mouth once daily (Patient taking differently: Take 200 mg by mouth daily. Take 1 tablet by mouth once daily) 90 tablet 1   atorvastatin (LIPITOR) 40 MG tablet Take 1 tablet by mouth once daily (Patient taking differently: Take 40 mg by mouth daily.) 90 tablet 0   Blood Glucose Monitoring Suppl (ACCU-CHEK GUIDE ME) w/Device KIT Use to check blood sugar 4 times a day. Dx code E11.65 1 kit 3   carvedilol (COREG) 6.25 MG tablet TAKE 1 TABLET BY MOUTH TWICE DAILY WITH A MEAL (Patient taking differently: Take 6.25 mg by mouth 2 (two) times daily with a meal.) 180 tablet 1   colchicine 0.6 MG tablet Take 0.6 mg by mouth daily as needed (gout flares).     diphenhydrAMINE (BENADRYL) 25 MG tablet Take 25 mg by mouth every 8 (eight) hours as needed for itching.     ethyl chloride spray  Apply 1 application topically See admin instructions. Apply topically to dialysis site if there is no time to use emla cream     glucose blood (ACCU-CHEK GUIDE) test strip Use to check blood sugar 4 times a day. Dx code E11.65 (Patient taking differently: daily.) 400 each 12   Insulin Glargine (BASAGLAR KWIKPEN) 100 UNIT/ML Inject 20 Units into the skin daily as needed (only if blood sugar over 150).     Insulin Pen Needle 29G X 5MM MISC Use as directed 200 each 0   latanoprost (XALATAN) 0.005 % ophthalmic solution Place 1 drop into both eyes at bedtime.      levothyroxine (SYNTHROID) 175 MCG tablet Take 1 tablet (175 mcg total) by mouth daily before breakfast. 30 tablet 2   lidocaine-prilocaine (EMLA) cream Apply 1 application topically See admin instructions. Apply to dialysis site 30 minutes before arriving at dialysis on  Monday,Wednesday,friday     loratadine (CLARITIN) 10 MG tablet Take 1 tablet (10 mg total) by mouth daily as needed for allergies. 90 tablet 2   Methoxy PEG-Epoetin Beta (MIRCERA IJ) Mircera     midodrine (PROAMATINE) 10 MG tablet Take 10 mg by mouth See admin instructions. Dialysis days Monday,Wednesday,friday     Multiple Vitamin (MULTIVITAMIN WITH MINERALS) TABS tablet Take 1 tablet by mouth every morning. Centrum Silver 50+     oxyCODONE-acetaminophen (PERCOCET) 5-325 MG tablet Take 1 tablet by mouth every 4 (four) hours as needed. 10 tablet 0   pantoprazole (PROTONIX) 40 MG tablet Take 1 tablet (40 mg total) by mouth daily. 90 tablet 6   polyethylene glycol (MIRALAX / GLYCOLAX) 17 g packet Take 17 g by mouth daily as needed for moderate constipation.     sildenafil (REVATIO) 20 MG tablet TAKE 1 TABLET BY MOUTH THREE TIMES DAILY (Patient taking differently: Take 20 mg by mouth 3 (three) times daily.) 90 tablet 3   TRADJENTA 5 MG TABS tablet Take 5 mg by mouth daily.     blood glucose meter kit and supplies Dispense based on patient and insurance preference. Use up to four  times daily as directed. (FOR  ICD-10 E10.9, E11.9). (Patient taking differently: daily.) 1 each 3   ONETOUCH ULTRA test strip CHECK BLOOD SUGARS TWICE DAILY 100 each 11     Discharge Medications: Please see discharge summary for a list of discharge medications.  Relevant Imaging Results:  Relevant Lab Results:   Additional Information SSN: [id number]. Patient has had two covid vaccines plus 2 boosters. Patient goes to Dialysis MWF at [facility] on [facility]. Patient states she takes insulin if her blood sugar is over 150.  Raina Mina, LCSWA

## 2021-09-03 NOTE — TOC Initial Note (Signed)
Transition of Care [facility]) - Initial/Assessment Note    Patient Details  Name: Patricia Mata MRN: [id number] Date of Birth: 06/16/1956  Transition of Care [facility]) CM/SW Contact:    Raina Mina, [place] Phone Number: 09/03/2021, 1:51 PM  Clinical Narrative: Patient is now being admitted under observation. CSW went over the SNF recommendations from PT. Patient agreed to participate in SNF if she can go to [facility]. Patient stated that is the only facility that she wants to go to because she has been there before and its close to her house. Patient also spoke to her husband who also stated he wanted [facility]. CSW explained its based off of bed availability. CSW told patient she would start the bed search. CSW spoke with [facility] who stated they have beds open and would review patient.                  Expected Discharge Plan: Skilled Nursing Facility Barriers to Discharge: Continued Medical Work up   Patient Goals and CMS Choice Patient states their goals for this hospitalization and ongoing recovery are:: Return home      Expected Discharge Plan and Services Expected Discharge Plan: [place]       Living arrangements for the past 2 months: Single Family Home                                      Prior Living Arrangements/Services Living arrangements for the past 2 months: Single Family Home Lives with:: Spouse Patient language and need for interpreter reviewed:: Yes Do you feel safe going back to the place where you live?: Yes      Need for Family Participation in Patient Care: Yes (Comment) Care giver support system in place?: Yes (comment)   Criminal Activity/Legal Involvement Pertinent to Current Situation/Hospitalization: No - Comment as needed  Activities of Daily Living      Permission Sought/Granted Permission sought to share information with : Family Supports Permission granted to share information  with : Yes, Verbal Permission Granted  Share Information with NAME: Carsyn Boster     Permission granted to share info w Relationship: Spouse  Permission granted to share info w Contact Information: [phone]  Emotional Assessment Appearance:: Appears stated age Attitude/Demeanor/Rapport: Engaged Affect (typically observed): Accepting Orientation: : Oriented to Self, Oriented to Place, Oriented to  Time, Oriented to Situation Alcohol / Substance Use: Not Applicable Psych Involvement: No (comment)  Admission diagnosis:  Acute pain of right knee [M25.561] Patient Active Problem List   Diagnosis Date Noted   Acute pain of right knee 09/03/2021   Chronic diastolic CHF (congestive heart failure) ([place]) 09/03/2021   Acute blood loss anemia 05/24/2021   Thrombocytopenia ([place]) 05/13/2021   Secondary hypercoagulable state ([place]) 05/07/2021   Atrial fibrillation with RVR ([place]) 04/21/2021   Anemia 01/13/2021   Secondary hyperparathyroidism of renal origin ([place]) 01/07/2021   Obesity 11/23/2020   Joint pain 11/23/2020   Other long term (current) drug therapy 11/23/2020   Primary osteoarthritis 11/23/2020   ESRD (end stage renal disease) ([place]) 11/22/2020   Nonrheumatic mitral (valve) stenosis 10/17/2020   Coagulation defect, unspecified ([place]) 45/80/9983   Complication of vascular dialysis catheter 10/14/2020   Peripheral vascular disease ([place]) 10/14/2020   Angina pectoris ([place]) 10/09/2020   Angina at rest [facility]) 10/09/2020   Gastroesophageal reflux disease 09/12/2020   History of malignant neoplasm  of colon 09/12/2020   Chronic gastritis 09/02/2020   Symptomatic anemia 08/31/2020   Abnormal uterine bleeding (AUB) 07/19/2020   Vaginal ulceration 07/19/2020   Palliative care by specialist    Acute on chronic diastolic CHF (congestive heart failure) ([place]) 06/07/2020   Chronic cough 05/04/2020   Allergic rhinitis 03/23/2020   Chronic kidney disease, stage 5 ([place]) 29/52/8413   Acute  diastolic CHF (congestive heart failure) ([place]) 09/21/2019   HCAP (healthcare-associated pneumonia) 09/20/2019   Pulmonary hypertension ([place]) 09/20/2019   DM2 (diabetes mellitus, type 2) ([place]) 09/20/2019   Elevated troponin 09/20/2019   CAP (community acquired pneumonia) 09/14/2019   Epistaxis, recurrent 08/10/2019   Neutropenia ([place]) 05/25/2019   Malignant neoplasm of colon ([place]) 05/25/2019   Bradycardia 03/21/2019   Type 2 diabetes mellitus with hemoglobin A1c goal of less than 7.0% ([place]) 12/10/2018   Hypothyroidism 12/10/2018   Seasonal allergies 12/10/2018   Gout 09/08/2018   Bacterial infection due to Morganella morganii    Sacral wound 06/08/2018   Iron deficiency anemia due to chronic blood loss 06/08/2018   Hyponatremia 06/08/2018   Constipation 06/08/2018   Encounter for nasogastric (NG) tube placement    Pressure injury of skin 05/22/2018   Paroxysmal atrial fibrillation ([place]) 05/11/2018   Normocytic anemia 06/14/2017   Aortic stenosis, severe 06/10/2017   Carotid artery disease ([place]) 06/10/2017   Essential hypertension 05/13/2017   Hyperlipidemia 05/13/2017   Bilateral lower extremity edema 05/13/2017   Port-A-Cath in place 04/28/2017   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 [place] [address] Phone: [phone] Fax: [phone]     Social Determinants of Health ([place]) Interventions    Readmission Risk Interventions Readmission Risk Prevention Plan 10/13/2020 10/12/2020   Medication Review (RN Care Manager) Complete Complete  Some recent data might be hidden

## 2021-09-03 NOTE — Assessment & Plan Note (Addendum)
Continue amiodarone and coreg No anticoagulation due to transfusion dependent anemia

## 2021-09-03 NOTE — Consult Note (Signed)
[facility] Renal Consultation Note    Indication for Consultation:  Management of ESRD/hemodialysis, anemia, hypertension/volume, and secondary hyperparathyroidism. PCP:  HPI: Patricia Mata is a 66 y.o. female with ESRD, transfusion-dependent myelodysplastic syndrome, CAD, T2DM, Hx colon cancer, p A-fib, and severe aortic stenosis who was admitted OBS status for placement.   Had a fall this morning, "legs gave out." Brought to ED with R knee and L ankle pain. X-rays done without acute fractures. Labs with K 4.2, Ca 9.9, WBC 9.2, Hgb 8.3.  Initial plan was brace to R knee and L ankle, then home. But she was unable to ambulate so was admitted for placement until she is able to move around/do ADLs at home. PT and SW/CM working on plan for her.  In meantime, she missed HD today. Dialyzes on MWF schedule at [facility]. No recent issues with RUE AVF. No CP, dyspnea, abdominal pain, N/V/D. No fever or chills.  Past Medical History:  Diagnosis Date   Acute renal failure (ARF) ([place]) 06/08/2018   TTHS- [place]   Carotid artery disease [facility])    Chronic diastolic (congestive) heart failure (HCC)    Colon cancer ([place]) 2008   Diabetes mellitus ([place])    Dyspnea    ESRD on hemodialysis ([place])    Gout 09/08/2018   Heart attack ([place])    Heart murmur    Hypertension    Hypothyroidism    Macrocytic anemia    MDS (myelodysplastic syndrome) (HCC)    Moderate mitral stenosis    PAD (peripheral artery disease) (HCC)    PAF (paroxysmal atrial fibrillation) (HCC)    Pulmonary hypertension (HCC)    Renal artery stenosis (HCC)    a. >60% R renal artery stenosis by duplex 2021, managed medically.   Severe aortic stenosis    Transfusion-dependent anemia    Past Surgical History:  Procedure Laterality Date   AV FISTULA PLACEMENT Right 09/26/2020   Procedure: RIGHT ARM ARTERIOVENOUS (AV) FISTULA CREATION;  Surgeon: Waynetta Sandy, MD;  Location: [facility];  Service: Vascular;   Laterality: Right;  Monitor Anesthesia Care with Regional Block to Right Arm    [place] Right 12/13/2020   Procedure: RIGHT UPPER EXTREMITY ARTERIOVENOUS FISTULA TRANSPOSITION;  Surgeon: Waynetta Sandy, MD;  Location: [facility];  Service: Vascular;  Laterality: Right;   COLON SURGERY     COLONOSCOPY WITH PROPOFOL N/A 02/22/2021   Procedure: COLONOSCOPY WITH PROPOFOL;  Surgeon: Otis Brace, MD;  Location: [facility];  Service: Gastroenterology;  Laterality: N/A;   DEBRIDMENT OF DECUBITUS ULCER N/A 06/12/2018   Procedure: DEBRIDMENT OF DECUBITUS ULCER;  Surgeon: Wallace Going, DO;  Location: [facility];  Service: Plastics;  Laterality: N/A;   ENTEROSCOPY N/A 02/22/2021   Procedure: ENTEROSCOPY;  Surgeon: Otis Brace, MD;  [facility];  Service: Gastroenterology;  Laterality: N/A;   ENTEROSCOPY N/A 05/24/2021   Procedure: ENTEROSCOPY;  Surgeon: Ronnette Juniper, MD;  Location: [facility];  Service: Gastroenterology;  Laterality: N/A;   ESOPHAGOGASTRODUODENOSCOPY N/A 09/01/2020   Procedure: ESOPHAGOGASTRODUODENOSCOPY (EGD);  Surgeon: Clarene Essex, MD;  Location: [facility];  Service: Endoscopy;  Laterality: N/A;   GIVENS CAPSULE STUDY N/A 09/01/2020   Procedure: GIVENS CAPSULE STUDY;  Surgeon: Clarene Essex, MD;  Location: [facility];  Service: Endoscopy;  Laterality: N/A;   HEMOSTASIS CLIP PLACEMENT  02/22/2021   Procedure: HEMOSTASIS CLIP PLACEMENT;  Surgeon: Otis Brace, MD;  Location: [facility];  Service: Gastroenterology;;   HOT HEMOSTASIS N/A 02/22/2021   Procedure: HOT  HEMOSTASIS (ARGON PLASMA COAGULATION/BICAP);  Surgeon: Otis Brace, MD;  Location: [facility];  Service: Gastroenterology;  Laterality: N/A;   HOT HEMOSTASIS N/A 05/24/2021   Procedure: HOT HEMOSTASIS (ARGON PLASMA COAGULATION/BICAP);  Surgeon: Ronnette Juniper, MD;  Location: [facility];  Service: Gastroenterology;  Laterality: N/A;   IR FLUORO GUIDE CV LINE RIGHT   10/11/2020   IR FLUORO GUIDE PORT INSERTION RIGHT  04/02/2017   IR REMOVAL TUN ACCESS W/ PORT W/O FL MOD SED  03/16/2019   IR US GUIDE VASC ACCESS RIGHT  04/02/2017   IR US GUIDE VASC ACCESS RIGHT  10/11/2020   LIGATION OF COMPETING BRANCHES OF ARTERIOVENOUS FISTULA Right 12/13/2020   Procedure: LIGATION OF COMPETING BRANCHES OF RIGHT UPPER EXTREMITY ARTERIOVENOUS FISTULA;  Surgeon: Waynetta Sandy, MD;  Location: [facility];  Service: Vascular;  Laterality: Right;   POLYPECTOMY  02/22/2021   Procedure: POLYPECTOMY;  Surgeon: Otis Brace, MD;  Location: [facility];  Service: Gastroenterology;;   RIGHT HEART CATH N/A 04/23/2019   Procedure: RIGHT HEART CATH;  Surgeon: Jolaine Artist, MD;  Location: [facility];  Service: Cardiovascular;  Laterality: N/A;   RIGHT/LEFT HEART CATH AND CORONARY ANGIOGRAPHY N/A 05/21/2018   Procedure: RIGHT/LEFT HEART CATH AND CORONARY ANGIOGRAPHY;  Surgeon: Nelva Bush, MD;  Location: [facility];  Service: Cardiovascular;  Laterality: N/A;   RIGHT/LEFT HEART CATH AND CORONARY ANGIOGRAPHY N/A 10/13/2020   Procedure: RIGHT/LEFT HEART CATH AND CORONARY ANGIOGRAPHY;  Surgeon: Jolaine Artist, MD;  Location: [facility];  Service: Cardiovascular;  Laterality: N/A;   TUBAL LIGATION     Family History  Problem Relation Age of Onset   Hypertension Mother    Diabetes Mother    Cervical cancer Mother    Heart attack Father    Hypertension Sister    Hypertension Brother    Hypertension Sister    Hypertension Sister    Prostate cancer Brother    HIV/AIDS Brother    Social History:  reports that she quit smoking about 26 years ago. Her smoking use included cigarettes. She has a 5.00 pack-year smoking history. She has quit using smokeless tobacco. She reports current alcohol use of about 1.0 standard drink per week. She reports that she does not use drugs.  ROS: As per HPI otherwise negative.  Physical Exam: Vitals:   09/03/21  0915 09/03/21 1100 09/03/21 1145 09/03/21 1300  BP: (!) 168/48 (!) 161/44 (!) 163/44 (!) 163/41  Pulse: 75 79 91 89  Resp: '14 16 17 17  ' Temp:  98 F (36.7 C)    TempSrc:      SpO2: 98% 99% 94% 95%  Weight:      Height:         General: Well developed, well nourished, in no acute distress. Room air. Head: Normocephalic, atraumatic, sclera non-icteric, mucus membranes are moist. Neck: Supple without lymphadenopathy/masses. JVD not elevated. Lungs: Clear bilaterally to auscultation without wheezes, rales, or rhonchi.  Heart: RRR, 4/6 systolic murmur Abdomen: Soft, non-tender, non-distended with normoactive bowel sounds. Lower extremities: R knee in immobilizer brace, L ankle in small brace. Extreme pain with movement of LE. Neuro: Alert and oriented X 3.  Psych:  Responds to questions appropriately with a normal affect. Dialysis Access: RUE AVF + bruit  Allergies  Allergen Reactions   Lactose Intolerance (Gi) Diarrhea   Morphine And Related Itching   Sulfa Antibiotics Rash and Other (See Comments)    Blisters, also   Prior to Admission medications  Medication Sig Start Date End Date Taking? Authorizing Provider  Accu-Chek Softclix Lancets lancets Use to check blood sugar 4 times a day. Dx code E11.65 Patient taking differently: daily. 07/19/21  Yes [name], FNP  acetaminophen (TYLENOL) 500 MG tablet Take 1,000 mg by mouth every 6 (six) hours as needed (pain).   Yes [provider]  allopurinol (ZYLOPRIM) 300 MG tablet Take 300 mg by mouth every morning. 07/05/20  Yes [provider]  amiodarone (PACERONE) 200 MG tablet Take 1 tablet by mouth once daily Patient taking differently: Take 200 mg by mouth daily. Take 1 tablet by mouth once daily 05/07/21  Yes Fenton, Clint R, PA  atorvastatin (LIPITOR) 40 MG tablet Take 1 tablet by mouth once daily Patient taking differently: Take 40 mg by mouth daily. 08/27/21  Yes [name], FNP  Blood Glucose Monitoring  Suppl (ACCU-CHEK GUIDE ME) w/Device KIT Use to check blood sugar 4 times a day. Dx code E11.65 07/19/21  Yes [name], FNP  carvedilol (COREG) 6.25 MG tablet TAKE 1 TABLET BY MOUTH TWICE DAILY WITH A MEAL Patient taking differently: Take 6.25 mg by mouth 2 (two) times daily with a meal. 07/18/21  Yes [name], FNP  colchicine 0.6 MG tablet Take 0.6 mg by mouth daily as needed (gout flares).   Yes [provider]  diphenhydrAMINE (BENADRYL) 25 MG tablet Take 25 mg by mouth every 8 (eight) hours as needed for itching.   Yes [provider]  ethyl chloride spray Apply 1 application topically See admin instructions. Apply topically to dialysis site if there is no time to use emla cream 03/01/21  Yes [provider]  glucose blood (ACCU-CHEK GUIDE) test strip Use to check blood sugar 4 times a day. Dx code E11.65 Patient taking differently: daily. 07/19/21  Yes [name], FNP  Insulin Glargine (BASAGLAR KWIKPEN) 100 UNIT/ML Inject 20 Units into the skin daily as needed (only if blood sugar over 150).   Yes [provider]  Insulin Pen Needle 29G X 5MM MISC Use as directed 06/16/17  Yes Bonnielee Haff, MD  latanoprost (XALATAN) 0.005 % ophthalmic solution Place 1 drop into both eyes at bedtime.    Yes [provider]  levothyroxine (SYNTHROID) 175 MCG tablet Take 1 tablet (175 mcg total) by mouth daily before breakfast. 06/18/21  Yes [name], FNP  lidocaine-prilocaine (EMLA) cream Apply 1 application topically See admin instructions. Apply to dialysis site 30 minutes before arriving at dialysis on  Monday,Wednesday,friday 01/23/21  Yes [provider]  loratadine (CLARITIN) 10 MG tablet Take 1 tablet (10 mg total) by mouth daily as needed for allergies. 06/04/21 06/04/22 Yes [name], FNP  Methoxy PEG-Epoetin Beta (MIRCERA IJ) Mircera 05/03/21 05/02/22 Yes [provider]  midodrine (PROAMATINE) 10 MG tablet Take 10 mg by mouth  See admin instructions. Dialysis days Monday,Wednesday,friday 08/13/21  Yes [provider]  Multiple Vitamin (MULTIVITAMIN WITH MINERALS) TABS tablet Take 1 tablet by mouth every morning. Centrum Silver 50+   Yes [provider]  oxyCODONE-acetaminophen (PERCOCET) 5-325 MG tablet Take 1 tablet by mouth every 4 (four) hours as needed. 09/03/21  Yes Montine [place], PA-C  pantoprazole (PROTONIX) 40 MG tablet Take 1 tablet (40 mg total) by mouth daily. 05/26/21  Yes Shawna Clamp, MD  polyethylene glycol (MIRALAX / GLYCOLAX) 17 g packet Take 17 g by mouth daily as needed for moderate constipation. 01/14/21  Yes Vann, Jessica U, DO  sildenafil (REVATIO) 20 MG tablet TAKE 1  TABLET BY MOUTH THREE TIMES DAILY Patient taking differently: Take 20 mg by mouth 3 (three) times daily. 07/24/21  Yes Bensimhon, Shaune Pascal, MD  TRADJENTA 5 MG TABS tablet Take 5 mg by mouth daily. 07/18/21  Yes [provider]  blood glucose meter kit and supplies Dispense based on patient and insurance preference. Use up to four times daily as directed. (FOR ICD-10 E10.9, E11.9). Patient taking differently: daily. 11/04/18   [name], FNP  [facility] ULTRA test strip CHECK BLOOD SUGARS TWICE DAILY 05/28/21   [name], FNP   Current Facility-Administered Medications  Medication Dose Route Frequency Provider Last Rate Last Admin   0.9 %  sodium chloride infusion  250 mL Intravenous PRN Orma Flaming, MD       cyclobenzaprine (FLEXERIL) tablet 5 mg  5 mg Oral TID PRN Orma Flaming, MD       diphenhydrAMINE (BENADRYL) capsule 25 mg  25 mg Oral Q6H PRN Orma Flaming, MD       methocarbamol (ROBAXIN) injection 1,000 mg  1,000 mg Intravenous Once [name], MD       oxyCODONE (Oxy IR/ROXICODONE) immediate release tablet 5 mg  5 mg Oral Q6H PRN Orma Flaming, MD       sodium chloride flush (NS) 0.9 % injection 3 mL  3 mL Intravenous Q12H Orma Flaming, MD       sodium chloride flush (NS) 0.9 %  injection 3 mL  3 mL Intravenous PRN Orma Flaming, MD       traZODone (DESYREL) tablet 25 mg  25 mg Oral QHS PRN Orma Flaming, MD       Current Outpatient Medications  Medication Sig Dispense Refill   Accu-Chek Softclix Lancets lancets Use to check blood sugar 4 times a day. Dx code E11.65 (Patient taking differently: daily.) 400 each 12   acetaminophen (TYLENOL) 500 MG tablet Take 1,000 mg by mouth every 6 (six) hours as needed (pain).     allopurinol (ZYLOPRIM) 300 MG tablet Take 300 mg by mouth every morning.     amiodarone (PACERONE) 200 MG tablet Take 1 tablet by mouth once daily (Patient taking differently: Take 200 mg by mouth daily. Take 1 tablet by mouth once daily) 90 tablet 1   atorvastatin (LIPITOR) 40 MG tablet Take 1 tablet by mouth once daily (Patient taking differently: Take 40 mg by mouth daily.) 90 tablet 0   Blood Glucose Monitoring Suppl (ACCU-CHEK GUIDE ME) w/Device KIT Use to check blood sugar 4 times a day. Dx code E11.65 1 kit 3   carvedilol (COREG) 6.25 MG tablet TAKE 1 TABLET BY MOUTH TWICE DAILY WITH A MEAL (Patient taking differently: Take 6.25 mg by mouth 2 (two) times daily with a meal.) 180 tablet 1   colchicine 0.6 MG tablet Take 0.6 mg by mouth daily as needed (gout flares).     diphenhydrAMINE (BENADRYL) 25 MG tablet Take 25 mg by mouth every 8 (eight) hours as needed for itching.     ethyl chloride spray Apply 1 application topically See admin instructions. Apply topically to dialysis site if there is no time to use emla cream     glucose blood (ACCU-CHEK GUIDE) test strip Use to check blood sugar 4 times a day. Dx code E11.65 (Patient taking differently: daily.) 400 each 12   Insulin Glargine (BASAGLAR KWIKPEN) 100 UNIT/ML Inject 20 Units into the skin daily as needed (only if blood sugar over 150).     Insulin Pen Needle 29G X 5MM MISC  Use as directed 200 each 0   latanoprost (XALATAN) 0.005 % ophthalmic solution Place 1 drop into both eyes at bedtime.       levothyroxine (SYNTHROID) 175 MCG tablet Take 1 tablet (175 mcg total) by mouth daily before breakfast. 30 tablet 2   lidocaine-prilocaine (EMLA) cream Apply 1 application topically See admin instructions. Apply to dialysis site 30 minutes before arriving at dialysis on  Monday,Wednesday,friday     loratadine (CLARITIN) 10 MG tablet Take 1 tablet (10 mg total) by mouth daily as needed for allergies. 90 tablet 2   Methoxy PEG-Epoetin Beta (MIRCERA IJ) Mircera     midodrine (PROAMATINE) 10 MG tablet Take 10 mg by mouth See admin instructions. Dialysis days Monday,Wednesday,friday     Multiple Vitamin (MULTIVITAMIN WITH MINERALS) TABS tablet Take 1 tablet by mouth every morning. Centrum Silver 50+     oxyCODONE-acetaminophen (PERCOCET) 5-325 MG tablet Take 1 tablet by mouth every 4 (four) hours as needed. 10 tablet 0   pantoprazole (PROTONIX) 40 MG tablet Take 1 tablet (40 mg total) by mouth daily. 90 tablet 6   polyethylene glycol (MIRALAX / GLYCOLAX) 17 g packet Take 17 g by mouth daily as needed for moderate constipation.     sildenafil (REVATIO) 20 MG tablet TAKE 1 TABLET BY MOUTH THREE TIMES DAILY (Patient taking differently: Take 20 mg by mouth 3 (three) times daily.) 90 tablet 3   TRADJENTA 5 MG TABS tablet Take 5 mg by mouth daily.     blood glucose meter kit and supplies Dispense based on patient and insurance preference. Use up to four times daily as directed. (FOR ICD-10 E10.9, E11.9). (Patient taking differently: daily.) 1 each 3   ONETOUCH ULTRA test strip CHECK BLOOD SUGARS TWICE DAILY 100 each 11   Labs: Basic Metabolic Panel: Recent Labs  Lab 09/03/21 0735  NA 141  K 4.2  CL 100  CO2 24  GLUCOSE 200*  BUN 86*  CREATININE 8.16*  CALCIUM 9.9   Liver Function Tests: Recent Labs  Lab 09/03/21 0735  AST 55*  ALT 38  ALKPHOS 121  BILITOT 1.0  PROT 7.1  ALBUMIN 3.5   CBC: Recent Labs  Lab 08/28/21 1140 09/03/21 0735  WBC 6.2 9.2  NEUTROABS 4.1 7.3  HGB 8.9* 8.3*   HCT 26.8* 24.1*  MCV 90.5 87.3  PLT 152 143*   Studies/Results: DG Knee 2 Views Right  Result Date: 09/04/2021 CLINICAL DATA:  Recent fall with right knee pain, initial encounter EXAM: RIGHT KNEE - 1-2 VIEW COMPARISON:  None. FINDINGS: Mild medial joint space narrowing is noted. No acute fracture or dislocation is noted. Vascular calcifications are noted as well as soft tissue calcifications. IMPRESSION: Degenerative change without acute abnormality. Electronically Signed   By: Inez Catalina M.D.   On: 08/28/2021 23:41   DG Ankle 2 Views Left  Result Date: 08/25/2021 CLINICAL DATA:  Recent fall with ankle pain, initial encounter EXAM: LEFT ANKLE - 2 VIEW COMPARISON:  None. FINDINGS: Generalized soft tissue swelling is noted about the ankle. Calcaneal spurring is seen. Tarsal degenerative changes are noted. No acute fracture or dislocation is noted. Vascular calcifications are noted. IMPRESSION: Soft tissue swelling without acute bony abnormality. Electronically Signed   By: Inez Catalina M.D.   On: 09/07/2021 23:44    Dialysis Orders:  MWF at [facility] 3:45hr, 400/A1.5, EDW 88kg, 2K/2Ca, AVF, heparin 2000 unit bolus - Calcitriol 0.14mg PO q HD - Anemia managed by oncology, transfusion dependent myelodysplastic syndrome. Failed high  dose ESA. Not iron deficient, last tsat 93%.  Assessment/Plan:  L ankle/R knee strain: No fractures on imaging. Put in ankle ASO and knee immobilizer, but unable to ambulate and pain uncontrolled. Being eval for SNF placement.  ESRD:  Usual MWF schedule - will add to our list for today. Due to very high census, we may have to roll her over until tomorrow morning. Thankfully, her labs and volume status are stable.  Hypertension/volume: BP slightly high (in pain), below OP dry weight.  Anemia: Hgb 8.3 - known transfusion dependent MDS - transfused q 2 weeks to keep Hgb > 8. Will plan for 1U with next HD.  Metabolic bone disease: Ca 9.9, Phos pending. Continue home  meds.  Veneta Penton, PA-C 09/03/2021, 1:43 PM  [name]

## 2021-09-03 NOTE — ED Notes (Signed)
OT at bedside, providing pt education with ortho devices.

## 2021-09-03 NOTE — Assessment & Plan Note (Signed)
Continue revatio

## 2021-09-03 NOTE — Assessment & Plan Note (Addendum)
Nephrology is following.  Dialyzed Monday Wednesday Friday.  Could not undergo dialysis yesterday.  Being dialyzed today.

## 2021-09-03 NOTE — ED Provider Notes (Addendum)
[facility] Emergency Department Provider Note MRN:  [id number]  Arrival date & time: 09/03/21     Chief Complaint   Fall   History of Present Illness   Patricia Mata is a 66 y.o. year-old female presents to the ED with chief complaint of fall.  She states that she was at her home tonight, when she was walking and felt like her knees gave out.  She complains of right knee pain and left ankle pain.  She states that the symptoms are worsened with palpation and movement.  She denies head injury or loss of consciousness.  Denies any successful treatments prior to arrival.  History provided by patient.  Review of Systems  Pertinent review of systems noted in HPI.    Physical Exam   Vitals:   09/03/21 0200 09/03/21 0212  BP: (!) 168/67 (!) 168/67  Pulse: 66 61  Resp: 16 16  Temp: 98.2 F (36.8 C) 98 F (36.7 C)  SpO2: 97% 97%    CONSTITUTIONAL:  well-appearing, NAD NEURO:  Alert and oriented x 3, CN 3-12 grossly intact EYES:  eyes equal and reactive ENT/NECK:  Supple, no stridor  CARDIO:  normal rate, regular rhythm, appears well-perfused  PULM:  No respiratory distress,  GI/GU:  non-distended,  MSK/SPINE:  No gross deformities, mild swelling of the right knee and left ankle, no open wounds SKIN:  no rash, atraumatic   *Additional and/or pertinent findings included in MDM below  Diagnostic and Interventional Summary    EKG Interpretation  Date/Time:    Ventricular Rate:    PR Interval:    QRS Duration:   QT Interval:    QTC Calculation:   R Axis:     Text Interpretation:         Labs Reviewed - No data to display  DG Knee 2 Views Right  Final Result    DG Ankle 2 Views Left  Final Result      Medications  HYDROcodone-acetaminophen (NORCO/VICODIN) 5-325 MG per tablet 2 tablet (2 tablets Oral Given 09/03/21 0105)     Procedures  /  Critical Care Procedures  ED Course and Medical Decision Making  I have reviewed the  triage vital signs, the nursing notes, and pertinent available records from the EMR.  Complexity of Problems Addressed: Moderate Complexity: Acute complicated illness or injury, requiring diagnostic workup as ordered and performed below.  ED Course:    After considering the following differential, extremity fracture, dislocation, or sprain, I agree with orders placed in triage for plain films of left ankle and right knee.  I visualized the left ankle and right knee x-ray which is notable for no fracture or dislocation and agree with the radiologist interpretation..  There is some swelling noted on x-ray and on physical exam concerning for sprain/strain.  Patient placed in knee immobilizer, ankle ASO, and given crutches.  Pain medicine sent to pharmacy.  Recommend orthopedic follow-up.   Social Determinants Affecting Care: No clinically significant social determinants affecting this chief complaint.    Consultants: No consultations were needed in caring for this patient.   Treatment and Plan:  Splinting with knee immobilizer and ankle ASO.  Treat pain with pain medicine, rest, ice, and elevation.  Recommend orthopedic follow-up.  Emergency department workup does not suggest an emergent condition requiring admission or immediate intervention beyond  what has been performed at this time. The patient is safe for discharge and has  been instructed to return immediately for worsening symptoms,  change in  symptoms or any other concerns  3:00 AM Upon attempting to discharge, patient states that she can't walk despite bracing with knee immobilizer, ankle ASO, crutches and treatment for pain.  Will consult TOC and PT/OT for nursing home placement.  Final Clinical Impressions(s) / ED Diagnoses     ICD-10-CM   1. Fall, initial encounter  W19.XXXA     2. Acute pain of right knee  M25.561     3. Acute left ankle pain  M25.572       ED Discharge Orders          Ordered     oxyCODONE-acetaminophen (PERCOCET) 5-325 MG tablet  Every 4 hours PRN        09/03/21 0205             Discharge Instructions Discussed with and Provided to Patient:   Discharge Instructions   None         Montine Circle, PA-C 09/03/21 7121    Fatima Blank, MD 09/03/21 [phone]

## 2021-09-03 NOTE — Assessment & Plan Note (Addendum)
Followed by oncology, Dr. Alen Blew. Current therapy: Reblozyl 75 mg subcutaneous injection every 3 weeks started in December 2021.  Red cell transfusion to keep hemoglobin above 8. Hemoglobin noted to be 7.9 today.  Will transfuse 1 unit.

## 2021-09-03 NOTE — Discharge Planning (Signed)
RNCM consulted regarding safe discharge planning (Home with Home Health vs Skilled Nursing Facility Placement).  Physical Therapy evaluation placed; will follow up after recommendations from PT.     

## 2021-09-03 NOTE — Assessment & Plan Note (Addendum)
Continue allopurinol 

## 2021-09-03 NOTE — Progress Notes (Signed)
Pt returned from MRI °

## 2021-09-03 NOTE — Evaluation (Signed)
Physical Therapy Evaluation Patient Details Name: Patricia Mata MRN: [id number] DOB: Apr 01, 1956 Today's Date: 09/03/2021  History of Present Illness  Pt is a 66 y/o female presenting on 08/22/2021 to the ED after a fall where her legs gave out on her. Imaging is negative for fractures in L ankle and R knee. PMH include HTN, DM2, PAF, CKD, and colon CA.  Clinical Impression  Patient presented to the ED on 08/17/2021 with R knee and L ankle pain after a fall at home consistent with patient's swelling in those joints. Pt's impairments include standing balance and strength deficits in BLE due to pain that are limiting her ability to safely and independently perform functional mobility. Patient requires max assist x2 with all functional mobility. Pt used a RW with transfers, however patient unable to achieve fully upright position. SPT recommending skilled short nursing short term rehab upon D/C due to maximize safe and independent functional mobility. PT will continue to follow acutely.        Recommendations for follow up therapy are one component of a multi-disciplinary discharge planning process, led by the attending physician.  Recommendations may be updated based on patient status, additional functional criteria and insurance authorization.  Follow Up Recommendations Skilled nursing-short term rehab (<3 hours/day)    Assistance Recommended at Discharge Frequent or constant Supervision/Assistance  Patient can return home with the following  Two people to help with walking and/or transfers;Two people to help with bathing/dressing/bathroom;Assistance with cooking/housework;Direct supervision/assist for medications management;Assist for transportation;Help with stairs or ramp for entrance    Equipment Recommendations Wheelchair (measurements PT);Wheelchair cushion (measurements PT)  Recommendations for Other Services       Functional Status Assessment Patient has had a recent decline in their  functional status and demonstrates the ability to make significant improvements in function in a reasonable and predictable amount of time.     Precautions / Restrictions Precautions Precautions: Fall Required Braces or Orthoses: Knee Immobilizer - Right;Other Brace Knee Immobilizer - Right: Other (comment) (as MD prescribed) Other Brace: L ankle ASO Restrictions Weight Bearing Restrictions: No      Mobility  Bed Mobility Overal bed mobility: Needs Assistance Bed Mobility: Supine to Sit, Sit to Supine     Supine to sit: Max assist, +2 for physical assistance, +2 for safety/equipment Sit to supine: Max assist, +2 for physical assistance, +2 for safety/equipment   General bed mobility comments: Max assist x2 needed for elevation of trunk and bringing R LE to EOB. Patient frantically and very firmly grabbing onto to SPT and PT before and during bed mobility. Required cues throughout for safe hand placement. Pt needed max assist with scooting to EOB with multimodal cues.With sit to supine, max assist needed for lowering trunk and elevating legs. SPT educating patient to give herself a big hug to limit pt grabbing onto [place] during movement. Pt reaches out and grabs PT throat unintentially and frantically.    Transfers Overall transfer level: Needs assistance Equipment used: Rolling walker (2 wheels) Transfers: Sit to/from Stand Sit to Stand: Max assist, +2 physical assistance, +2 safety/equipment, From elevated surface           General transfer comment: max assist x2 for lifting while attempt standing EOB. Patient WB BUE with RW. SPT gave cues for patient to stand up tall and squeeze glutes. Pt crying out in pain and unable to stand up tall. Further mobility has deferred for safety purposes.    Ambulation/Gait  Stairs            Wheelchair Mobility    Modified Rankin (Stroke Patients Only)       Balance Overall balance assessment: Needs  assistance Sitting-balance support: Feet unsupported, Bilateral upper extremity supported, No upper extremity supported Sitting balance-Leahy Scale: Good Sitting balance - Comments: once pt in sitting, able to sit without UE support without supervision. Still crying out in pain due to R knee pain   Standing balance support: Bilateral upper extremity supported, Reliant on assistive device for balance, During functional activity Standing balance-Leahy Scale: Zero Standing balance comment: patient unable to stand up tall without RW and max assist x2                             Pertinent Vitals/Pain Pain Assessment Pain Assessment: Faces Faces Pain Scale: Hurts whole lot Pain Location: R knee Pain Descriptors / Indicators: Cramping, Discomfort, Grimacing, Guarding, Throbbing Pain Intervention(s): Limited activity within patient's tolerance, Monitored during session, Patient requesting pain meds-RN notified    Home Living Family/patient expects to be discharged to:: Private residence Living Arrangements: Spouse/significant other Available Help at Discharge: Family;Available 24 hours/day Type of Home: House Home Access: Stairs to enter Entrance Stairs-Rails: Left;Right;Can reach both Entrance Stairs-Number of Steps: 4   Home Layout: One level Home Equipment: BSC/3in1;Shower seat;Rolling Environmental consultant (2 wheels) Additional Comments: drives, not using AD prior to admission    Prior Function Prior Level of Function : Independent/Modified Independent;Driving             Mobility Comments: Does not use AD; states she does not walk long distances because her cardiologist told her she should not due to her heart valve issue. ADLs Comments: reports independent with all; husband cooks all the time because "that's not her thing"     Hand Dominance        Extremity/Trunk Assessment   Upper Extremity Assessment Upper Extremity Assessment: Overall WFL for tasks assessed    Lower  Extremity Assessment Lower Extremity Assessment: RLE deficits/detail;LLE deficits/detail;Generalized weakness RLE Deficits / Details: mild knee swelling and throbbing pain limiting treatment session LLE Deficits / Details: L ankle pain and swelling       Communication   Communication: No difficulties  Cognition Arousal/Alertness: Awake/alert Behavior During Therapy: Anxious (loudly cries out before initiating movement and with movement) Overall Cognitive Status: No family/caregiver present to determine baseline cognitive functioning                                 General Comments: cries out and frantically grabs onto SPT and PT during session before and during movement. Pt inconsist with following commands        General Comments General comments (skin integrity, edema, etc.): Patient calm and pleasant upon arrival. Began crying out in pain when SPT began discussing mobility. Patient very anxious and cautious with movement. Would cry out in pain when SPT gave cues about moving her leg before mobility tasks performed.    Exercises Total Joint Exercises Ankle Circles/Pumps: AROM, Both, Supine (2 reps)   Assessment/Plan    PT Assessment Patient needs continued PT services  PT Problem List Decreased activity tolerance;Decreased balance;Decreased mobility;Decreased knowledge of use of DME;Decreased safety awareness;Pain;Decreased strength;Decreased range of motion       PT Treatment Interventions Gait training;Stair training;Functional mobility training;Therapeutic activities;Therapeutic exercise;Balance training;Neuromuscular re-education;Patient/family education;DME instruction    PT Goals (  Current goals can be found in the Care Plan section)  Acute Rehab PT Goals Patient Stated Goal: to go home PT Goal Formulation: With patient Time For Goal Achievement: 09/17/21 Potential to Achieve Goals: Good    Frequency Min 2X/week     Co-evaluation                AM-PAC PT "6 Clicks" Mobility  Outcome Measure Help needed turning from your back to your side while in a flat bed without using bedrails?: A Lot Help needed moving from lying on your back to sitting on the side of a flat bed without using bedrails?: Total Help needed moving to and from a bed to a chair (including a wheelchair)?: Total Help needed standing up from a chair using your arms (e.g., wheelchair or bedside chair)?: Total Help needed to walk in hospital room?: Total Help needed climbing 3-5 steps with a railing? : Total 6 Click Score: 7    End of Session Equipment Utilized During Treatment: Gait belt Activity Tolerance: Patient limited by pain Patient left: in bed;with call bell/phone within reach (on stretcher in ED) Nurse Communication: Mobility status (patient asking for more pain medication) PT Visit Diagnosis: Unsteadiness on feet (R26.81);History of falling (Z91.81);Difficulty in walking, not elsewhere classified (R26.2);Muscle weakness (generalized) (M62.81);Pain Pain - Right/Left: Right Pain - part of body: Knee    Time: 2505-3976 PT Time Calculation (min) (ACUTE ONLY): 33 min   Charges:   PT Evaluation $PT Eval Moderate Complexity: 1 Mod PT Treatments $Therapeutic Activity: 8-22 mins        Jonne Ply, SPT  [place] 09/03/2021, 10:51 AM

## 2021-09-03 NOTE — Progress Notes (Signed)
Pt c/o generalized itching, Benadryl not due until 0006. BP 189/45, other VS stable. Olena Heckle, NP notified. Scheduled BP med given. Per Olena Heckle, NP it is ok to give Benadryl earlier.

## 2021-09-03 NOTE — Assessment & Plan Note (Addendum)
Hba1c of 5.9 two months ago.  Monitor CBGs.  Noted to be on SSI alone at this time.  Noted to be on Tradjenta.

## 2021-09-03 NOTE — Progress Notes (Signed)
Pt receives out-pt HD at [facility] on MWF. Contacted clinic and pt needs to arrive at 5:55 for 6:15 chair time. Will assist as needed.  Melven Sartorius Renal Navigator [phone]

## 2021-09-03 NOTE — Assessment & Plan Note (Signed)
TSH wnl 07/2021 Continue home synthroid at 152mcg

## 2021-09-03 NOTE — ED Notes (Signed)
This RN attempted to assist pt in removing pants so EDP could further assess pt's knee and ankle. Pt stated she's "unable to" and not allowing this RN to help.

## 2021-09-03 NOTE — Assessment & Plan Note (Signed)
Continue lipitor  ?

## 2021-09-03 NOTE — Assessment & Plan Note (Addendum)
Echo 07/2021: There is severe calcifcation of the aortic valve. There is severe thickening of the aortic valve. Aortic valve regurgitation is mild. Severe aortic valve stenosis.  Followed by Dr. Burt Knack, severe but stable AS Per his note in 07/2021: comorbid medical conditions remain prohibitive for consideration of TAVR.

## 2021-09-03 NOTE — ED Provider Notes (Signed)
66 year old female who presented last night with knee pain after fall.  Patient had imaging obtained did not show any definite fracture.  However, patient with pain and unable to walk.  She was kept for PT evaluation.  PT evaluated her today and she is unable to be mobilized. She has received IV pain medications and IV skeletal muscle relaxant Reexamination of the patient shows significant swelling about the right knee.  She is having spasms. Orthopedic surgery was consulted and Patricia Mata will see her in consultation. Hospitalist are consulted for admission due to injury and requirement of parenteral pain medication and inability to ambulate. Discussed with Dr. Eliberto Mata and she will see for admission   Patricia Boss, MD 09/03/21 1420

## 2021-09-03 NOTE — ED Notes (Signed)
Pt tearful and c/o pain. This RN notified EDP.

## 2021-09-04 ENCOUNTER — Inpatient Hospital Stay: Payer: Medicare PPO

## 2021-09-04 ENCOUNTER — Inpatient Hospital Stay: Payer: Medicare PPO | Admitting: Oncology

## 2021-09-04 ENCOUNTER — Inpatient Hospital Stay (HOSPITAL_COMMUNITY): Payer: Medicare PPO

## 2021-09-04 DIAGNOSIS — I5033 Acute on chronic diastolic (congestive) heart failure: Secondary | ICD-10-CM | POA: Diagnosis not present

## 2021-09-04 DIAGNOSIS — X501XXA Overexertion from prolonged static or awkward postures, initial encounter: Secondary | ICD-10-CM | POA: Diagnosis not present

## 2021-09-04 DIAGNOSIS — I5032 Chronic diastolic (congestive) heart failure: Secondary | ICD-10-CM | POA: Diagnosis present

## 2021-09-04 DIAGNOSIS — D469 Myelodysplastic syndrome, unspecified: Secondary | ICD-10-CM | POA: Diagnosis present

## 2021-09-04 DIAGNOSIS — I272 Pulmonary hypertension, unspecified: Secondary | ICD-10-CM | POA: Diagnosis present

## 2021-09-04 DIAGNOSIS — K922 Gastrointestinal hemorrhage, unspecified: Secondary | ICD-10-CM | POA: Diagnosis present

## 2021-09-04 DIAGNOSIS — I480 Paroxysmal atrial fibrillation: Secondary | ICD-10-CM

## 2021-09-04 DIAGNOSIS — W1830XA Fall on same level, unspecified, initial encounter: Secondary | ICD-10-CM | POA: Diagnosis present

## 2021-09-04 DIAGNOSIS — D638 Anemia in other chronic diseases classified elsewhere: Secondary | ICD-10-CM | POA: Diagnosis present

## 2021-09-04 DIAGNOSIS — M25561 Pain in right knee: Secondary | ICD-10-CM | POA: Diagnosis not present

## 2021-09-04 DIAGNOSIS — N179 Acute kidney failure, unspecified: Secondary | ICD-10-CM | POA: Diagnosis not present

## 2021-09-04 DIAGNOSIS — Z20822 Contact with and (suspected) exposure to covid-19: Secondary | ICD-10-CM | POA: Diagnosis present

## 2021-09-04 DIAGNOSIS — N186 End stage renal disease: Secondary | ICD-10-CM

## 2021-09-04 DIAGNOSIS — N2581 Secondary hyperparathyroidism of renal origin: Secondary | ICD-10-CM | POA: Diagnosis present

## 2021-09-04 DIAGNOSIS — I350 Nonrheumatic aortic (valve) stenosis: Secondary | ICD-10-CM

## 2021-09-04 DIAGNOSIS — E669 Obesity, unspecified: Secondary | ICD-10-CM | POA: Diagnosis present

## 2021-09-04 DIAGNOSIS — Y92009 Unspecified place in unspecified non-institutional (private) residence as the place of occurrence of the external cause: Secondary | ICD-10-CM | POA: Diagnosis not present

## 2021-09-04 DIAGNOSIS — Z992 Dependence on renal dialysis: Secondary | ICD-10-CM

## 2021-09-04 DIAGNOSIS — E785 Hyperlipidemia, unspecified: Secondary | ICD-10-CM | POA: Diagnosis present

## 2021-09-04 DIAGNOSIS — E1122 Type 2 diabetes mellitus with diabetic chronic kidney disease: Secondary | ICD-10-CM

## 2021-09-04 DIAGNOSIS — S72401A Unspecified fracture of lower end of right femur, initial encounter for closed fracture: Secondary | ICD-10-CM | POA: Diagnosis present

## 2021-09-04 DIAGNOSIS — K567 Ileus, unspecified: Secondary | ICD-10-CM | POA: Diagnosis present

## 2021-09-04 DIAGNOSIS — I2510 Atherosclerotic heart disease of native coronary artery without angina pectoris: Secondary | ICD-10-CM | POA: Diagnosis present

## 2021-09-04 DIAGNOSIS — S72421A Displaced fracture of lateral condyle of right femur, initial encounter for closed fracture: Secondary | ICD-10-CM | POA: Diagnosis present

## 2021-09-04 DIAGNOSIS — I701 Atherosclerosis of renal artery: Secondary | ICD-10-CM | POA: Diagnosis present

## 2021-09-04 DIAGNOSIS — I953 Hypotension of hemodialysis: Secondary | ICD-10-CM | POA: Diagnosis present

## 2021-09-04 DIAGNOSIS — S86911A Strain of unspecified muscle(s) and tendon(s) at lower leg level, right leg, initial encounter: Secondary | ICD-10-CM | POA: Diagnosis present

## 2021-09-04 DIAGNOSIS — E1151 Type 2 diabetes mellitus with diabetic peripheral angiopathy without gangrene: Secondary | ICD-10-CM | POA: Diagnosis present

## 2021-09-04 DIAGNOSIS — I132 Hypertensive heart and chronic kidney disease with heart failure and with stage 5 chronic kidney disease, or end stage renal disease: Secondary | ICD-10-CM | POA: Diagnosis present

## 2021-09-04 DIAGNOSIS — K56609 Unspecified intestinal obstruction, unspecified as to partial versus complete obstruction: Secondary | ICD-10-CM | POA: Diagnosis not present

## 2021-09-04 DIAGNOSIS — E039 Hypothyroidism, unspecified: Secondary | ICD-10-CM | POA: Diagnosis present

## 2021-09-04 LAB — CBC
HCT: 23.9 % — ABNORMAL LOW (ref 36.0–46.0)
Hemoglobin: 7.9 g/dL — ABNORMAL LOW (ref 12.0–15.0)
MCH: 29.5 pg (ref 26.0–34.0)
MCHC: 33.1 g/dL (ref 30.0–36.0)
MCV: 89.2 fL (ref 80.0–100.0)
Platelets: 130 10*3/uL — ABNORMAL LOW (ref 150–400)
RBC: 2.68 MIL/uL — ABNORMAL LOW (ref 3.87–5.11)
RDW: 17.7 % — ABNORMAL HIGH (ref 11.5–15.5)
WBC: 7.2 10*3/uL (ref 4.0–10.5)
nRBC: 0 % (ref 0.0–0.2)

## 2021-09-04 LAB — HEPATITIS B SURFACE ANTIGEN: Hepatitis B Surface Ag: NONREACTIVE

## 2021-09-04 LAB — HEMOGLOBIN AND HEMATOCRIT, BLOOD
HCT: 28.2 % — ABNORMAL LOW (ref 36.0–46.0)
Hemoglobin: 9.5 g/dL — ABNORMAL LOW (ref 12.0–15.0)

## 2021-09-04 LAB — BASIC METABOLIC PANEL
Anion gap: 15 (ref 5–15)
BUN: 100 mg/dL — ABNORMAL HIGH (ref 8–23)
CO2: 25 mmol/L (ref 22–32)
Calcium: 9.9 mg/dL (ref 8.9–10.3)
Chloride: 98 mmol/L (ref 98–111)
Creatinine, Ser: 8.75 mg/dL — ABNORMAL HIGH (ref 0.44–1.00)
GFR, Estimated: 5 mL/min — ABNORMAL LOW (ref >60)
Glucose, Bld: 120 mg/dL — ABNORMAL HIGH (ref 70–99)
Potassium: 4.4 mmol/L (ref 3.5–5.1)
Sodium: 138 mmol/L (ref 135–145)

## 2021-09-04 LAB — GLUCOSE, CAPILLARY
Glucose-Capillary: 104 mg/dL — ABNORMAL HIGH (ref 70–99)
Glucose-Capillary: 110 mg/dL — ABNORMAL HIGH (ref 70–99)
Glucose-Capillary: 117 mg/dL — ABNORMAL HIGH (ref 70–99)

## 2021-09-04 LAB — HEPATITIS B SURFACE ANTIBODY,QUALITATIVE: Hep B S Ab: REACTIVE — AB

## 2021-09-04 LAB — PREPARE RBC (CROSSMATCH)

## 2021-09-04 MED ORDER — SODIUM CHLORIDE 0.9 % IV SOLN: 100.0000 mL | INTRAVENOUS | Status: DC | PRN

## 2021-09-04 MED ORDER — HYDROMORPHONE HCL 1 MG/ML IJ SOLN
1.0000 mg | INTRAMUSCULAR | Status: DC | PRN
  Administered 2021-09-04: 2 mg via INTRAVENOUS
  Administered 2021-09-05 – 2021-09-08 (×7): 1 mg via INTRAVENOUS
  Filled 2021-09-04 (×3): qty 1
  Filled 2021-09-04: qty 2
  Filled 2021-09-04 (×3): qty 1

## 2021-09-04 MED ORDER — MIDODRINE HCL 5 MG PO TABS
ORAL_TABLET | ORAL | Status: AC
  Filled 2021-09-04: qty 2

## 2021-09-04 MED ORDER — LIDOCAINE HCL (PF) 1 % IJ SOLN: 5.0000 mL | INTRAMUSCULAR | Status: DC | PRN

## 2021-09-04 MED ORDER — PENTAFLUOROPROP-TETRAFLUOROETH EX AERO: 1.0000 "application " | INHALATION_SPRAY | CUTANEOUS | Status: DC | PRN

## 2021-09-04 MED ORDER — CHLORHEXIDINE GLUCONATE CLOTH 2 % EX PADS
6.0000 | MEDICATED_PAD | Freq: Every day | CUTANEOUS | Status: DC
  Administered 2021-09-04: 6 via TOPICAL

## 2021-09-04 MED ORDER — ALTEPLASE 2 MG IJ SOLR: 2.0000 mg | Freq: Once | INTRAMUSCULAR | Status: DC | PRN

## 2021-09-04 MED ORDER — ACETAMINOPHEN 325 MG PO TABS
650.0000 mg | ORAL_TABLET | Freq: Four times a day (QID) | ORAL | Status: DC | PRN
Start: 2021-09-04 — End: 2021-09-08

## 2021-09-04 MED ORDER — HEPARIN SODIUM (PORCINE) 1000 UNIT/ML DIALYSIS: 1000.0000 [IU] | INTRAMUSCULAR | Status: DC | PRN

## 2021-09-04 MED ORDER — HYDROMORPHONE HCL 1 MG/ML IJ SOLN
1.0000 mg | Freq: Once | INTRAMUSCULAR | Status: AC
  Administered 2021-09-04: 1 mg via INTRAVENOUS
  Filled 2021-09-04: qty 1

## 2021-09-04 MED ORDER — SODIUM CHLORIDE 0.9% IV SOLUTION: Freq: Once | INTRAVENOUS | Status: DC

## 2021-09-04 MED ORDER — LIDOCAINE-PRILOCAINE 2.5-2.5 % EX CREA: 1.0000 "application " | TOPICAL_CREAM | CUTANEOUS | Status: DC | PRN

## 2021-09-04 MED ORDER — CYCLOBENZAPRINE HCL 5 MG PO TABS
7.5000 mg | ORAL_TABLET | Freq: Three times a day (TID) | ORAL | Status: DC | PRN
  Administered 2021-09-04: 7.5 mg via ORAL
  Filled 2021-09-04: qty 2

## 2021-09-04 NOTE — Progress Notes (Signed)
HD nurse notified that pt has unit of RBCs ready in blood bank.

## 2021-09-04 NOTE — Progress Notes (Signed)
Hemodialysis- Tolerated well. Received 1 unit prbcs with dialysis as ordered. Given  pain medication x2 as well as benadryl. Report called to primary RN. Patient currently resting. Only complaints of constant pain and itching.

## 2021-09-04 NOTE — Plan of Care (Signed)
  Problem: Safety: Goal: Ability to remain free from injury will improve Outcome: Progressing   Problem: Activity: Goal: Risk for activity intolerance will decrease Outcome: Not Progressing   

## 2021-09-04 NOTE — Progress Notes (Signed)
Report given to Hemodialysis RN. ?

## 2021-09-04 NOTE — Progress Notes (Signed)
[facility] PROGRESS NOTE   Patricia Mata ZDG:[id number] DOB: 13-Jun-1956 DOA: 09/05/2021  0 DOS: the patient was seen and examined on 09/04/2021  PCP: [name], FNP  Brief History and Hospital Course:  66 y.o. female with medical history significant of aortic stenosis, diastolic CHF, ESRD on dialysis MWF, T2DM, hypothyroidism, HTN, IDA, PAF, hx of colon cancer s/p hemicolectomy and chemo with resultant MDS and transfusion dependent anemia who presented to ED after fall on 09/11/2021. She hurt her right knee and could not walk.  Imaging studies in the abdomen were unremarkable.  Due to significant pain and discomfort she was hospitalized.  MRI was done which reveals a nondisplaced fracture of the superior aspect of the anterolateral femoral condyle extending to the superior margin of the articular surface of the lateral trochlea and the lateral wall of the intercondylar notch.  Orthopedics has been consulted.  Consultants: Orthopedics.  Nephrology.  Procedures: None yet    Subjective: Patient complains of severe pain in her right lower extremity around the knee area.  Denies any chest pain shortness of breath.  No nausea vomiting.    Assessment/Plan:   * Acute pain of right knee- (present on admission) 66 year old presenting to ED with fall and sudden right knee pain and inability to bear weight.  Imaging studies did not reveal any injuries.  Patient could not ambulate.  Orthopedics was consulted.  MRI was done which reveals a nondisplaced fracture of the superior aspect of the anterolateral femoral condyle extending to the superior margin of the articular surface of the lateral trochlea and the lateral wall of the intercondylar notch.  Further management per orthopedics.  If they think patient needs to undergo surgery then we will need to involve cardiology to provide preoperative assessment and risk stratification. Pain control.  Aortic stenosis, severe Echo 07/2021:  There is severe calcifcation of the aortic valve. There is severe thickening of the aortic valve. Aortic valve regurgitation is mild. Severe aortic valve stenosis.  Followed by Dr. Burt Knack, severe but stable AS Per his note in 07/2021: comorbid medical conditions remain prohibitive for consideration of TAVR.  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 Alen Blew. Current therapy:  Reblozyl 75 mg subcutaneous injection every 3 weeks started in December 2021.  Red cell transfusion to keep hemoglobin above 8. Hemoglobin noted to be 7.9 today.  Will transfuse 1 unit.  DM2 (diabetes mellitus, type 2) (HCC) Hba1c of 5.9 two months ago.  Monitor CBGs.  Noted to be on SSI alone at this time.  Noted to be on Tradjenta.  Pulmonary hypertension ([place])- (present on admission) Continue revatio   ESRD (end stage renal disease) ([place])- (present on admission) Nephrology is following.  Dialyzed Monday Wednesday Friday.  Could not undergo dialysis yesterday.  Being dialyzed today.    Hyperlipidemia- (present on admission) Continue lipitor   Gout- (present on admission) Continue allopurinol   Hypothyroidism- (present on admission) TSH wnl 07/2021 Continue home synthroid at 153mcg    Obesity Estimated body mass index is 34.44 kg/m as calculated from the following:   Height as of this encounter: 5\' 4"  (1.626 m).   Weight as of this encounter: 91 kg.  DVT Prophylaxis: SCDs Code Status: Full code Family Communication: Discussed with patient Disposition Plan: To be determined  Status is: Observation The patient will require care  spanning > 2 midnights and should be moved to inpatient because: Inability to ambulate.  Fracture noted on MRI.     Medications: Scheduled:  sodium chloride   Intravenous Once   allopurinol  300 mg Oral q morning   amiodarone  200 mg Oral Daily   atorvastatin  40 mg Oral Daily   carvedilol  6.25 mg Oral BID WC   Chlorhexidine Gluconate Cloth  6 each Topical Q0600   insulin aspart  0-6 Units Subcutaneous TID WC   latanoprost  1 drop Both Eyes QHS   levothyroxine  175 mcg Oral QAC breakfast   linagliptin  5 mg Oral Daily   midodrine       [START ON 09/05/2021] midodrine  10 mg Oral Q M,W,F-HD   pantoprazole  40 mg Oral Daily   sildenafil  20 mg Oral TID   sodium chloride flush  3 mL Intravenous Q12H   Continuous:  sodium chloride     sodium chloride     sodium chloride     ZLD:JTTSVX chloride, sodium chloride, sodium chloride, alteplase, cyclobenzaprine, diphenhydrAMINE, heparin, HYDROmorphone (DILAUDID) injection, insulin glargine-yfgn, lidocaine (PF), lidocaine-prilocaine, oxyCODONE, pentafluoroprop-tetrafluoroeth, sodium chloride flush, traZODone  Antibiotics: Anti-infectives (From admission, onward)    None       Objective:  Vital Signs  Vitals:   09/04/21 0915 09/04/21 0930 09/04/21 0945 09/04/21 1000  BP: (!) 107/35 (!) 117/43 (!) 113/46 (!) 118/58  Pulse:      Resp: 16 15 15 16   Temp: 99 F (37.2 C) 98.5 F (36.9 C) 98.6 F (37 C)   TempSrc: Oral Oral Oral   SpO2: 96% 95% 96% 95%  Weight:      Height:        Intake/Output Summary (Last 24 hours) at 09/04/2021 1039 Last data filed at 09/04/2021 0945 Gross per 24 hour  Intake 795 ml  Output --  Net 795 ml   Filed Weights   09/03/2021 2303 09/03/21 1624 09/04/21 0740  Weight: 87.1 kg 89.1 kg 91 kg    General appearance: Awake alert.  In no distress Resp: Clear to auscultation bilaterally.  Normal effort Cardio: S1-S2 is normal regular.  No S3-S4.  No rubs murmurs or bruit GI: Abdomen is soft.   Nontender nondistended.  Bowel sounds are present normal.  No masses organomegaly Extremities: Immobilizer noted over the right lower extremity Neurologic: Alert and oriented x3.  No focal neurological deficits.    Lab Results:  Data Reviewed: I have personally reviewed labs and imaging study reports  CBC: Recent Labs  Lab 08/28/21 1140 09/03/21 0735 09/03/21 2130 09/04/21 0323  WBC 6.2 9.2 9.5 7.2  NEUTROABS 4.1 7.3  --   --   HGB 8.9* 8.3* 8.5* 7.9*  HCT 26.8* 24.1* 24.1* 23.9*  MCV 90.5 87.3 87.6 89.2  PLT 152 143* 140* 130*    Basic Metabolic Panel: Recent Labs  Lab 09/03/21 0735 09/04/21 0323  NA 141 138  K 4.2 4.4  CL 100 98  CO2 24 25  GLUCOSE 200* 120*  BUN 86* 100*  CREATININE 8.16* 8.75*  CALCIUM 9.9 9.9    GFR: Estimated Creatinine Clearance: 6.9 mL/min (A) (by C-G formula based on SCr of 8.75 mg/dL (H)).  Liver Function Tests: Recent Labs  Lab 09/03/21 0735  AST 55*  ALT 38  ALKPHOS 121  BILITOT 1.0  PROT 7.1  ALBUMIN 3.5     Coagulation Profile: Recent Labs  Lab 09/03/21 1451  INR 1.1     CBG: Recent Labs  Lab 09/03/21 1634 09/03/21 2245  GLUCAP 162* 101*     Recent Results (from the past 240 hour(s))  Resp Panel by RT-PCR (Flu A&B, Covid) Nasopharyngeal Swab     Status: None   Collection Time: 09/03/21  7:25 AM   Specimen: Nasopharyngeal Swab; Nasopharyngeal(NP) swabs in vial transport medium  Result Value Ref Range Status   SARS Coronavirus 2 by RT PCR NEGATIVE NEGATIVE Final    Comment: (NOTE) SARS-CoV-2 target nucleic acids are NOT DETECTED.  The SARS-CoV-2 RNA is generally detectable in upper respiratory specimens during the acute phase of infection. The lowest concentration of SARS-CoV-2 viral copies this assay can detect is 138 copies/mL. A negative result does not preclude SARS-Cov-2 infection and should not be used as the sole basis for treatment or other patient management decisions. A negative result may  occur with  improper specimen collection/handling, submission of specimen other than nasopharyngeal swab, presence of viral mutation(s) within the areas targeted by this assay, and inadequate number of viral copies(<138 copies/mL). A negative result must be combined with clinical observations, patient history, and epidemiological information. The expected result is Negative.  Fact Sheet for Patients:  EntrepreneurPulse.com.au  Fact Sheet for Healthcare Providers:  IncredibleEmployment.be  This test is no t yet approved or cleared by the [place] FDA and  has been authorized for detection and/or diagnosis of SARS-CoV-2 by FDA under an Emergency Use Authorization (EUA). This EUA will remain  in effect (meaning this test can be used) for the duration of the COVID-19 declaration under Section 564(b)(1) of the Act, 21 U.S.C.section 360bbb-3(b)(1), unless the authorization is terminated  or revoked sooner.       Influenza A by PCR NEGATIVE NEGATIVE Final   Influenza B by PCR NEGATIVE NEGATIVE Final    Comment: (NOTE) The Xpert Xpress SARS-CoV-2/FLU/RSV plus assay is intended as an aid in the diagnosis of influenza from Nasopharyngeal swab specimens and should not be used as a sole basis for treatment. Nasal washings and aspirates are unacceptable for Xpert Xpress SARS-CoV-2/FLU/RSV testing.  Fact Sheet for Patients: EntrepreneurPulse.com.au  Fact Sheet for Healthcare Providers: IncredibleEmployment.be  This test is not yet approved or cleared by the [place] FDA and has been authorized for detection and/or diagnosis of SARS-CoV-2 by FDA under an Emergency Use Authorization (EUA). This EUA will remain in effect (meaning this test can be used) for the duration of the COVID-19 declaration under Section 564(b)(1) of the Act, 21 U.S.C. section 360bbb-3(b)(1), unless the authorization is terminated  or revoked.  Performed at [facility], [place] [address], [place], [place] [address]       Radiology Studies: DG Knee 2 Views Right  Result Date: 08/21/2021 CLINICAL DATA:  Recent fall with right knee pain, initial encounter EXAM: RIGHT KNEE - 1-2 VIEW COMPARISON:  None. FINDINGS: Mild medial joint space narrowing is noted. No acute fracture or dislocation is noted. Vascular calcifications are noted as well as soft tissue calcifications. IMPRESSION: Degenerative change without acute abnormality. Electronically Signed   By: Inez Catalina M.D.   On: 08/21/2021 23:41   DG Ankle 2 Views Left  Result Date: 08/27/2021 CLINICAL DATA:  Recent fall with ankle pain, initial encounter EXAM: LEFT ANKLE - 2 VIEW COMPARISON:  None. FINDINGS: Generalized soft tissue swelling is noted about the ankle. Calcaneal  spurring is seen. Tarsal degenerative changes are noted. No acute fracture or dislocation is noted. Vascular calcifications are noted. IMPRESSION: Soft tissue swelling without acute bony abnormality. Electronically Signed   By: Inez Catalina M.D.   On: 08/15/2021 23:44   MR KNEE RIGHT WO CONTRAST  Result Date: 09/04/2021 CLINICAL DATA:  History of knee trauma. Status post fall at home. Right knee pain. EXAM: MRI OF THE RIGHT KNEE WITHOUT CONTRAST TECHNIQUE: Multiplanar, multisequence MR imaging of the knee was performed. No intravenous contrast was administered. COMPARISON:  09/01/2021 FINDINGS: MENISCI Medial: Relative attenuation of the body of the medial meniscus with a small oblique tear of the posterior body junction of the medial meniscus (image 2/series 21). Lateral: Possible small tiny radial tear of the free edge of the body of the lateral meniscus seen on a single image (image 18/series 20). LIGAMENTS Cruciates: ACL and PCL are intact. Collaterals: Medial collateral ligament is intact. Lateral collateral ligament complex is intact. CARTILAGE Patellofemoral: Partial-thickness cartilage loss of  the lateral patellofemoral compartment. Medial: Mild partial-thickness cartilage loss of the medial femorotibial compartment. Lateral: Mild partial-thickness cartilage of the lateral femorotibial compartment. JOINT: Large lipohemarthrosis. Normal Hoffa's fat-pad. No plical thickening. POPLITEAL FOSSA: Popliteus tendon is intact. Small Baker's cyst. EXTENSOR MECHANISM: Intact quadriceps tendon. Intact patellar tendon. Intact lateral patellar retinaculum. Intact medial patellar retinaculum. Intact MPFL. BONES: No aggressive osseous lesion. Nondisplaced fracture of the superior aspect of the anterolateral femoral condyle extending to the superior margin of the articular surface of the lateral trochlea and the lateral wall of the intercondylar notch. Osseous contusion of the lateral aspect of the patella. Other: No fluid collection or hematoma. Muscles are normal. IMPRESSION: 1. Nondisplaced fracture of the superior aspect of the anterolateral femoral condyle extending to the superior margin of the articular surface of the lateral trochlea and the lateral wall of the intercondylar notch. 2. Osseous contusion of the lateral aspect of the patella. 3. Large lipohemarthrosis. 4. Relative attenuation of the body of the medial meniscus with a small oblique tear of the posterior body junction of the medial meniscus. 5. Possible small tiny radial tear of the free edge of the body of the lateral meniscus seen on a single image. 6. Mild tricompartmental cartilage abnormalities as described above. Electronically Signed   By: Kathreen Devoid M.D.   On: 09/04/2021 06:04       LOS: 0 days   [facility] Pager on [email or web address]  09/04/2021, 10:39 AM

## 2021-09-04 NOTE — Progress Notes (Signed)
[facility] Progress Note   Subjective:  Seen on HD this morning (rolled over from yesterday). No CP/dyspnea. Initially calm, then having spasms in R knee -> due for pain meds shortly and will give. Hgb 7.9 today - will give 1U PRBCs during HD today. R knee MRI done late last night -> does show non-displaced fracture of femoral condyle with medial/lateral meniscal tears.   Objective Vitals:   09/04/21 0800 09/04/21 0830 09/04/21 0845 09/04/21 0849  BP: (!) 100/41 (!) 99/45 (!) 99/45 (!) 104/38  Pulse:   89   Resp: 15 16 16    Temp:   98.5 F (36.9 C)   TempSrc:   Oral   SpO2: 96%     Weight:      Height:       Physical Exam General: Well appearing woman, NAD. Room air. Heart: RRR; no murmur Lungs: CTA anteriorly Abdomen: soft Extremities: Dialysis Access:   Additional Objective Labs: Basic Metabolic Panel: Recent Labs  Lab 09/03/21 0735 09/04/21 0323  NA 141 138  K 4.2 4.4  CL 100 98  CO2 24 25  GLUCOSE 200* 120*  BUN 86* 100*  CREATININE 8.16* 8.75*  CALCIUM 9.9 9.9   Liver Function Tests: Recent Labs  Lab 09/03/21 0735  AST 55*  ALT 38  ALKPHOS 121  BILITOT 1.0  PROT 7.1  ALBUMIN 3.5   CBC: Recent Labs  Lab 08/28/21 1140 09/03/21 0735 09/03/21 2130 09/04/21 0323  WBC 6.2 9.2 9.5 7.2  NEUTROABS 4.1 7.3  --   --   HGB 8.9* 8.3* 8.5* 7.9*  HCT 26.8* 24.1* 24.1* 23.9*  MCV 90.5 87.3 87.6 89.2  PLT 152 143* 140* 130*   CBG: Recent Labs  Lab 09/03/21 1634 09/03/21 2245  GLUCAP 162* 101*   Studies/Results: DG Knee 2 Views Right  Result Date: 08/18/2021 CLINICAL DATA:  Recent fall with right knee pain, initial encounter EXAM: RIGHT KNEE - 1-2 VIEW COMPARISON:  None. FINDINGS: Mild medial joint space narrowing is noted. No acute fracture or dislocation is noted. Vascular calcifications are noted as well as soft tissue calcifications. IMPRESSION: Degenerative change without acute abnormality. Electronically Signed   By: Inez Catalina  M.D.   On: 08/17/2021 23:41   DG Ankle 2 Views Left  Result Date: 08/18/2021 CLINICAL DATA:  Recent fall with ankle pain, initial encounter EXAM: LEFT ANKLE - 2 VIEW COMPARISON:  None. FINDINGS: Generalized soft tissue swelling is noted about the ankle. Calcaneal spurring is seen. Tarsal degenerative changes are noted. No acute fracture or dislocation is noted. Vascular calcifications are noted. IMPRESSION: Soft tissue swelling without acute bony abnormality. Electronically Signed   By: Inez Catalina M.D.   On: 08/25/2021 23:44   MR KNEE RIGHT WO CONTRAST  Result Date: 09/04/2021 CLINICAL DATA:  History of knee trauma. Status post fall at home. Right knee pain. EXAM: MRI OF THE RIGHT KNEE WITHOUT CONTRAST TECHNIQUE: Multiplanar, multisequence MR imaging of the knee was performed. No intravenous contrast was administered. COMPARISON:  08/20/2021 FINDINGS: MENISCI Medial: Relative attenuation of the body of the medial meniscus with a small oblique tear of the posterior body junction of the medial meniscus (image 2/series 21). Lateral: Possible small tiny radial tear of the free edge of the body of the lateral meniscus seen on a single image (image 18/series 20). LIGAMENTS Cruciates: ACL and PCL are intact. Collaterals: Medial collateral ligament is intact. Lateral collateral ligament complex is intact. CARTILAGE Patellofemoral: Partial-thickness cartilage loss of the lateral patellofemoral  compartment. Medial: Mild partial-thickness cartilage loss of the medial femorotibial compartment. Lateral: Mild partial-thickness cartilage of the lateral femorotibial compartment. JOINT: Large lipohemarthrosis. Normal Hoffa's fat-pad. No plical thickening. POPLITEAL FOSSA: Popliteus tendon is intact. Small Baker's cyst. EXTENSOR MECHANISM: Intact quadriceps tendon. Intact patellar tendon. Intact lateral patellar retinaculum. Intact medial patellar retinaculum. Intact MPFL. BONES: No aggressive osseous lesion. Nondisplaced  fracture of the superior aspect of the anterolateral femoral condyle extending to the superior margin of the articular surface of the lateral trochlea and the lateral wall of the intercondylar notch. Osseous contusion of the lateral aspect of the patella. Other: No fluid collection or hematoma. Muscles are normal. IMPRESSION: 1. Nondisplaced fracture of the superior aspect of the anterolateral femoral condyle extending to the superior margin of the articular surface of the lateral trochlea and the lateral wall of the intercondylar notch. 2. Osseous contusion of the lateral aspect of the patella. 3. Large lipohemarthrosis. 4. Relative attenuation of the body of the medial meniscus with a small oblique tear of the posterior body junction of the medial meniscus. 5. Possible small tiny radial tear of the free edge of the body of the lateral meniscus seen on a single image. 6. Mild tricompartmental cartilage abnormalities as described above. Electronically Signed   By: Kathreen Devoid M.D.   On: 09/04/2021 06:04    Medications:  sodium chloride     sodium chloride     sodium chloride      sodium chloride   Intravenous Once   allopurinol  300 mg Oral q morning   amiodarone  200 mg Oral Daily   atorvastatin  40 mg Oral Daily   carvedilol  6.25 mg Oral BID WC   insulin aspart  0-6 Units Subcutaneous TID WC   latanoprost  1 drop Both Eyes QHS   levothyroxine  175 mcg Oral QAC breakfast   linagliptin  5 mg Oral Daily   midodrine       [START ON 09/05/2021] midodrine  10 mg Oral Q M,W,F-HD   pantoprazole  40 mg Oral Daily   sildenafil  20 mg Oral TID   sodium chloride flush  3 mL Intravenous Q12H    Dialysis Orders: MWF at [facility] 3:45hr, 400/A1.5, EDW 88kg, 2K/2Ca, AVF, heparin 2000 unit bolus - Calcitriol 0.56mcg PO q HD - Anemia managed by oncology, transfusion dependent myelodysplastic syndrome. Failed high dose ESA. Not iron deficient, last tsat 93%.   Assessment/Plan:  L ankle strain/R knee  nondisplaced fracture/meniscal tears: No fracture on xray, but present on MRI. In ankle brace and knee immobilizer, but unable to ambulate and pain uncontrolled. Being eval for SNF placement.  ESRD:  Usual MWF schedule. HD today as rollover from yesterday, HD tomorrow to get back on schedule.  Hypertension/volume: BP soft today (on pain meds), UF as tolerated.  Anemia/MDS: Hgb 8.3 -> 7.9 - known transfusion dependent MDS - transfused q 2 weeks to keep Hgb > 8. Will give 1U PRBCs with HD today.  Metabolic bone disease: Ca 9.9, Phos pending. Continue home meds.  Veneta Penton, PA-C 09/04/2021, 8:55 AM  [name]

## 2021-09-04 NOTE — Hospital Course (Addendum)
66 y.o. female with medical history significant of aortic stenosis, diastolic CHF, ESRD on dialysis MWF, T2DM, hypothyroidism, HTN, IDA, PAF, hx of colon cancer s/p hemicolectomy and chemo with resultant MDS and transfusion dependent anemia who presented to ED after fall on 08/25/2021. She hurt her right knee and could not walk.  Imaging studies in the abdomen were unremarkable.  Due to significant pain and discomfort she was hospitalized.  MRI was done which reveals a nondisplaced fracture of the superior aspect of the anterolateral femoral condyle extending to the superior margin of the articular surface of the lateral trochlea and the lateral wall of the intercondylar notch.  Orthopedics has been consulted.

## 2021-09-05 ENCOUNTER — Inpatient Hospital Stay (HOSPITAL_COMMUNITY): Payer: Medicare PPO

## 2021-09-05 DIAGNOSIS — I350 Nonrheumatic aortic (valve) stenosis: Secondary | ICD-10-CM

## 2021-09-05 DIAGNOSIS — I5032 Chronic diastolic (congestive) heart failure: Secondary | ICD-10-CM

## 2021-09-05 DIAGNOSIS — M25561 Pain in right knee: Secondary | ICD-10-CM | POA: Diagnosis not present

## 2021-09-05 LAB — TYPE AND SCREEN
ABO/RH(D): A NEG
Antibody Screen: NEGATIVE
Unit division: 0

## 2021-09-05 LAB — RENAL FUNCTION PANEL
Albumin: 3.1 g/dL — ABNORMAL LOW (ref 3.5–5.0)
Anion gap: 15 (ref 5–15)
BUN: 42 mg/dL — ABNORMAL HIGH (ref 8–23)
CO2: 25 mmol/L (ref 22–32)
Calcium: 9.2 mg/dL (ref 8.9–10.3)
Chloride: 91 mmol/L — ABNORMAL LOW (ref 98–111)
Creatinine, Ser: 5.74 mg/dL — ABNORMAL HIGH (ref 0.44–1.00)
GFR, Estimated: 8 mL/min — ABNORMAL LOW
Glucose, Bld: 116 mg/dL — ABNORMAL HIGH (ref 70–99)
Phosphorus: 6.1 mg/dL — ABNORMAL HIGH (ref 2.5–4.6)
Potassium: 4 mmol/L (ref 3.5–5.1)
Sodium: 131 mmol/L — ABNORMAL LOW (ref 135–145)

## 2021-09-05 LAB — HEPATITIS B SURFACE ANTIBODY, QUANTITATIVE: Hep B S AB Quant (Post): >1000 m[IU]/mL (ref >9.9)

## 2021-09-05 LAB — BPAM RBC
Blood Product Expiration Date: 202303182359
ISSUE DATE / TIME: 202302210832
Unit Type and Rh: 600

## 2021-09-05 LAB — CBC
HCT: 25.1 % — ABNORMAL LOW (ref 36.0–46.0)
Hemoglobin: 8.6 g/dL — ABNORMAL LOW (ref 12.0–15.0)
MCH: 31 pg (ref 26.0–34.0)
MCHC: 34.3 g/dL (ref 30.0–36.0)
MCV: 90.6 fL (ref 80.0–100.0)
Platelets: 121 K/uL — ABNORMAL LOW (ref 150–400)
RBC: 2.77 MIL/uL — ABNORMAL LOW (ref 3.87–5.11)
RDW: 17.2 % — ABNORMAL HIGH (ref 11.5–15.5)
WBC: 7.8 K/uL (ref 4.0–10.5)
nRBC: 0 % (ref 0.0–0.2)

## 2021-09-05 LAB — GLUCOSE, CAPILLARY
Glucose-Capillary: 112 mg/dL — ABNORMAL HIGH (ref 70–99)
Glucose-Capillary: 99 mg/dL (ref 70–99)
Glucose-Capillary: 123 mg/dL — ABNORMAL HIGH (ref 70–99)
Glucose-Capillary: 151 mg/dL — ABNORMAL HIGH (ref 70–99)

## 2021-09-05 MED ORDER — HYDROMORPHONE HCL 1 MG/ML IJ SOLN
INTRAMUSCULAR | Status: AC
  Filled 2021-09-05: qty 1

## 2021-09-05 MED ORDER — SEVELAMER CARBONATE 800 MG PO TABS
800.0000 mg | ORAL_TABLET | Freq: Three times a day (TID) | ORAL | Status: DC
  Administered 2021-09-05 – 2021-09-07 (×7): 800 mg via ORAL
  Filled 2021-09-05 (×7): qty 1

## 2021-09-05 MED ORDER — CHLORHEXIDINE GLUCONATE CLOTH 2 % EX PADS
6.0000 | MEDICATED_PAD | Freq: Every day | CUTANEOUS | Status: DC
  Administered 2021-09-05 – 2021-09-07 (×3): 6 via TOPICAL

## 2021-09-05 MED ORDER — LIP MEDEX EX OINT
TOPICAL_OINTMENT | CUTANEOUS | Status: DC | PRN
  Filled 2021-09-05: qty 7

## 2021-09-05 MED ORDER — CYCLOBENZAPRINE HCL 10 MG PO TABS
10.0000 mg | ORAL_TABLET | Freq: Three times a day (TID) | ORAL | Status: DC | PRN
  Administered 2021-09-05 – 2021-09-06 (×4): 10 mg via ORAL
  Filled 2021-09-05 (×4): qty 1

## 2021-09-05 NOTE — Progress Notes (Signed)
PROGRESS NOTE  Patricia Mata  DOB: [birth year]  PCP: [name], [place] IZT:[id number]  DOA: 09/03/2021  LOS: 1 day  Hospital Day: 4  Brief narrative: Patricia Mata is a 66 y.o. female with PMH significant for ESRD HD MWF, DM 2, HTN, diastolic CHF, pulmonary hypertension, severe aortic stenosis, carotid artery disease, peripheral artery disease, PAF, hypothyroidism, hx of colon cancer s/p hemicolectomy and chemo with resultant MDS and transfusion dependent anemia. Patient presented to the ED on 08/26/2021 after a fall hurting her right knee and inability to walk.  X-ray right knee on admission showed degenerative change without acute abnormality.  But because of significant pain and inability to walk, patient was hospitalized. Next day on 2/20, MRI right knee was obtained which showed a nondisplaced fracture of the superior aspect of the anterolateral femoral condyle extending to the superior margin of the articular surface of the lateral trochlea and the lateral wall of the intercondylar notch.   Orthopedics was consulted.   Subjective: Patient was seen and examined this morning.  She was at dialysis.  Elderly African-American female.  Screams with pain even when the blanket rubs on her knee.  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   -Initial x-ray did not show an acute abnormality but MRI right knee with nondisplaced fracture  of the femoral condyle with intra-articular extension.   -Orthopedics consult appreciated.  Continue plan of surgery on 2/23. -Preop risk assessment with cardiology -Continue pain control currently on as needed oxycodone, as needed IV Dilaudid, as needed Flexeril  ESRD HD MWF -Nephrology following.  Aortic stenosis, severe -Echo 07/2021 showed severe calcification and thickening of the aortic valve resulting severe aortic stenosis. -Followed by Dr. Burt Knack, severe but stable AS -Per his note in 07/2021: comorbid medical conditions remain prohibitive for consideration of TAVR.   Chronic diastolic CHF (congestive heart failure)  -Appears euvolemic. 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 Alen Blew. -Current therapy:  Reblozyl 75 mg subcutaneous injection every 3 weeks started in December 2021.  -Red cell transfusion to keep hemoglobin above 8. -Hemoglobin was at 7.9 on 2/21.  1 unit of PRBC given.  Hemoglobin 8.6 today. -Continue to monitor. Recent Labs    10/09/20 1656 10/10/20 0939 12/26/20 1515 01/03/21 0849 05/13/21 2314 05/14/21 0223 05/15/21 0449 09/03/21 0735 09/03/21 2130 09/04/21 0323 09/04/21 1333 09/05/21 0446  HGB  --    < >  --    < >  --  7.1*   < > 8.3* 8.5* 7.9* 9.5* 8.6*  MCV  --    < >  --    < >  --  88.1   < >  87.3 87.6 89.2  --  90.6  VITAMINB12  --   --   --   --   --  1,155*  --   --   --   --   --   --   FOLATE  --   --   --   --   --  15.7  --   --   --   --   --   --   FERRITIN  --   --   --   --   --  2,121*  --   --   --   --   --   --   TIBC  --   --   --   --   --  209*  --   --   --   --   --   --   IRON  --   --   --   --   --  204*  --    --   --   --   --   --   RETICCTPCT 1.8  --  1.4  --  2.2 2.7  --   --   --   --   --   --    < > = values in this interval not displayed.   Type 2 diabetes mellitus -A1c 5.9 on 06/2021 -Home meds include Tradjenta -Currently on sliding scale insulin -Blood sugar level consistently less than 150. Recent Labs  Lab 09/03/21 2245 09/04/21 1249 09/04/21 1539 09/04/21 2334 09/05/21 0655  GLUCAP 101* 117* 104* 110* 112*   Hyperlipidemia  -Continue lipitor    Gout  Continue allopurinol    Hypothyroidism -TSH wnl 07/2021 -Continue home synthroid at 180mcg   Obesity -Body mass index is 34.17 kg/m. Patient has been advised to make an attempt to improve diet and exercise patterns to aid in weight loss.  Mobility: PT eval postprocedure required Goals of care   Code Status: Full Code    Nutritional status:  Body mass index is 34.17 kg/m.      Diet:  Diet Order             Diet NPO time specified  Diet effective midnight           Diet renal/carb modified with fluid restriction Diet-HS Snack? Nothing; Fluid restriction: 1200 mL Fluid; Room service appropriate? Yes; Fluid consistency: Thin  Diet effective now                   DVT prophylaxis:  SCDs Start: 09/03/21 1341   Antimicrobials: None Fluid: None Consultants: Nephrology, orthopedics, cardiology Family Communication: None at bedside  Status is: Inpatient  Continue in-hospital care because: Tentative plan for surgery tomorrow Level of care: Telemetry Medical   Dispo: The patient is from: Home              Anticipated d/c is to: Pending OR              Patient currently is not medically stable to d/c.   Difficult to place patient No     Infusions:   sodium chloride      Scheduled Meds:  sodium chloride   Intravenous Once   allopurinol  300 mg Oral q morning   amiodarone  200 mg Oral Daily   atorvastatin  40 mg Oral Daily   carvedilol  6.25 mg Oral BID WC   Chlorhexidine Gluconate Cloth  6  each  Topical Daily   insulin aspart  0-6 Units Subcutaneous TID WC   latanoprost  1 drop Both Eyes QHS   levothyroxine  175 mcg Oral QAC breakfast   linagliptin  5 mg Oral Daily   midodrine  10 mg Oral Q M,W,F-HD   pantoprazole  40 mg Oral Daily   sevelamer carbonate  800 mg Oral TID WC   sildenafil  20 mg Oral TID   sodium chloride flush  3 mL Intravenous Q12H    PRN meds: sodium chloride, acetaminophen, cyclobenzaprine, diphenhydrAMINE, HYDROmorphone (DILAUDID) injection, insulin glargine-yfgn, oxyCODONE, sodium chloride flush, traZODone   Antimicrobials: Anti-infectives (From admission, onward)    None       Objective: Vitals:   09/05/21 1015 09/05/21 1030  BP: (!) 89/56 (!) (P) 111/40  Pulse: (!) 55 (P) 70  Resp:    Temp:    SpO2:      Intake/Output Summary (Last 24 hours) at 09/05/2021 1058 Last data filed at 09/04/2021 2043 Gross per 24 hour  Intake 123 ml  Output 2000 ml  Net -1877 ml   Filed Weights   09/04/21 0740 09/04/21 1125 09/05/21 0735  Weight: 91 kg 88 kg 90.3 kg   Weight change: 1.9 kg Body mass index is 34.17 kg/m.   Physical Exam: General exam: Pleasant, elderly African-American female. Skin: No rashes, lesions or ulcers. HEENT: Atraumatic, normocephalic, no obvious bleeding Lungs: Clear to auscultation bilaterally CVS: Regular rate and rhythm, no murmur GI/Abd soft, nontender, nondistended, bowel sound present CNS: Alert, awake, oriented x3 Psychiatry: Sad affect because of acute episodic pain Extremities: No pedal edema, no calf tenderness.  Right knee tender  Data Review: I have personally reviewed the laboratory data and studies available.  F/u labs ordered Unresulted Labs (From admission, onward)     Start     Ordered   Unscheduled  CBC with Differential/Platelet  Daily,   R     Question:  Specimen collection method  Answer:  Lab=Lab collect   09/05/21 1058   Unscheduled  Basic metabolic panel  Daily,   R     Question:   Specimen collection method  Answer:  Lab=Lab collect   09/05/21 1058            Signed, Terrilee Croak, MD [facility] 09/05/2021

## 2021-09-05 NOTE — Progress Notes (Signed)
Family express concern about pt. Being over sedated and not able to eat. I attempted to feed pt. While she was awake she took one bite of chicken and refused the rest. Stated that she just dose want to eat, stomach aches.notified active bowl sounds all four quadrant. Doc notified.

## 2021-09-05 NOTE — TOC Progression Note (Signed)
Transition of Care [facility]) - Progression Note    Patient Details  Name: Patricia Mata MRN: [id number] Date of Birth: [birth year]  Transition of Care [facility]) CM/SW Contact  Joanne Chars, LCSW Phone Number: 09/05/2021, 2:25 PM  Clinical Narrative:   CSW spoke with [facility] who does offer SNF bed.  Pt is HD, [facility], MWF arrive 555am for 615am chair time.  Per pt, husband is transportation to HD and kathy/[facility] said this is acceptable during rehab as well.  CSW shared with pt that [facility] has offered bed, pt in some pain and conversation kept short.  TOC will continue to follow.      Expected Discharge Plan: [place] Barriers to Discharge: Continued Medical Work up  Expected Discharge Plan and Services Expected Discharge Plan: [place] arrangements for the past 2 months: Single Family Home                                       Social Determinants of Health (SDOH) Interventions    Readmission Risk Interventions Readmission Risk Prevention Plan 10/13/2020 10/12/2020  Medication Review (RN Care Manager) Complete Complete  Some recent data might be hidden

## 2021-09-05 NOTE — Progress Notes (Signed)
°  Brief consult recommendations. Full note to follow.   Ask to provide pre-op CV risk stratification in setting of acute femur fracture.  Patient well known to me from clinic. I saw her last week on 08/30/21 for routine f/u.   66 y/o woman  with critical AS, ESRD, transfusion-dependent anemia. CAD with left main disease and PAH.  Despite her heart disease has remained fairly functional and minimally symptomatic.  Nevertheless with CAD and critical AS should we high to prohibitive risk for surgery with GA. Would only proceed if patient would otherwise remain bedbound or in severe pain without surgery. In that situation patient would have to be apprised of at least 10-20% risk of severe operative morbidity and mortality.  D/w trauma team   Glori Bickers, MD  11:22 AM

## 2021-09-05 NOTE — Progress Notes (Signed)
[facility] Progress Note   Subjective:  Seen on HD - 2.5L UFG and tolerating. C/o knee pain and itching, s/p pain med and benadryl. No dyspnea or CP.  Objective Vitals:   09/05/21 0735 09/05/21 0739 09/05/21 0748 09/05/21 0800  BP:  (!) 137/42 (!) 112/41 (!) 126/47  Pulse:  63 64 68  Resp:  18 18   Temp:  (!) 97.5 F (36.4 C)    TempSrc:  Temporal    SpO2:  98%    Weight: 90.3 kg     Height:       Physical Exam General: Well appearing woman, NAD. Room air. Heart: RRR; no murmur Lungs: CTA anteriorly Abdomen: soft Extremities: No overt LE edema but difficult to examine, severe pain with moving blanket. Dialysis Access: RUE AVF + bruit  Additional Objective Labs: Basic Metabolic Panel: Recent Labs  Lab 09/03/21 0735 09/04/21 0323 09/05/21 0446  NA 141 138 131*  K 4.2 4.4 4.0  CL 100 98 91*  CO2 24 25 25   GLUCOSE 200* 120* 116*  BUN 86* 100* 42*  CREATININE 8.16* 8.75* 5.74*  CALCIUM 9.9 9.9 9.2  PHOS  --   --  6.1*   Liver Function Tests: Recent Labs  Lab 09/03/21 0735 09/05/21 0446  AST 55*  --   ALT 38  --   ALKPHOS 121  --   BILITOT 1.0  --   PROT 7.1  --   ALBUMIN 3.5 3.1*   CBC: Recent Labs  Lab 09/03/21 0735 09/03/21 2130 09/04/21 0323 09/04/21 1333 09/05/21 0446  WBC 9.2 9.5 7.2  --  7.8  NEUTROABS 7.3  --   --   --   --   HGB 8.3* 8.5* 7.9* 9.5* 8.6*  HCT 24.1* 24.1* 23.9* 28.2* 25.1*  MCV 87.3 87.6 89.2  --  90.6  PLT 143* 140* 130*  --  121*   Studies/Results: CT KNEE RIGHT WO CONTRAST  Result Date: 09/04/2021 CLINICAL DATA:  Knee trauma, occult fracture. EXAM: CT OF THE RIGHT KNEE WITHOUT CONTRAST TECHNIQUE: Multidetector CT imaging of the right knee was performed according to the standard protocol. Multiplanar CT image reconstructions were also generated. RADIATION DOSE REDUCTION: This exam was performed according to the departmental dose-optimization program which includes automated exposure control, adjustment of  the mA and/or kV according to patient size and/or use of iterative reconstruction technique. COMPARISON:  MRI 09/03/2021 FINDINGS: Bones/Joint/Cartilage Generalized osteopenia. Mildly comminuted fracture of the anterior lateral femoral condyle with a fracture cleft extending to the articular surface of the lateral trochlea. Oblique fracture cleft extends from the lateral supracondylar distal femoral metaphysis to the distal diaphysis. Normal alignment. Moderate lipohemarthrosis. No other fracture or dislocation. No aggressive osseous lesion. Ligaments Ligaments are suboptimally evaluated by CT. Muscles and Tendons Atrophy of the medial and lateral gastrocnemius muscles. No intramuscular fluid collection or hematoma. Soft tissue No fluid collection or hematoma. No soft tissue mass. Peripheral vascular atherosclerotic disease. IMPRESSION: 1. Mildly comminuted fracture of the lateral femoral condyle with a fracture cleft extending to the articular surface of the anterior lateral trochlea. Oblique fracture cleft extends from the lateral supracondylar distal femoral metaphysis to the distal diaphysis. Electronically Signed   By: Kathreen Devoid M.D.   On: 09/04/2021 14:12   MR KNEE RIGHT WO CONTRAST  Result Date: 09/04/2021 CLINICAL DATA:  History of knee trauma. Status post fall at home. Right knee pain. EXAM: MRI OF THE RIGHT KNEE WITHOUT CONTRAST TECHNIQUE: Multiplanar, multisequence MR imaging of  the knee was performed. No intravenous contrast was administered. COMPARISON:  08/21/2021 FINDINGS: MENISCI Medial: Relative attenuation of the body of the medial meniscus with a small oblique tear of the posterior body junction of the medial meniscus (image 2/series 21). Lateral: Possible small tiny radial tear of the free edge of the body of the lateral meniscus seen on a single image (image 18/series 20). LIGAMENTS Cruciates: ACL and PCL are intact. Collaterals: Medial collateral ligament is intact. Lateral collateral  ligament complex is intact. CARTILAGE Patellofemoral: Partial-thickness cartilage loss of the lateral patellofemoral compartment. Medial: Mild partial-thickness cartilage loss of the medial femorotibial compartment. Lateral: Mild partial-thickness cartilage of the lateral femorotibial compartment. JOINT: Large lipohemarthrosis. Normal Hoffa's fat-pad. No plical thickening. POPLITEAL FOSSA: Popliteus tendon is intact. Small Baker's cyst. EXTENSOR MECHANISM: Intact quadriceps tendon. Intact patellar tendon. Intact lateral patellar retinaculum. Intact medial patellar retinaculum. Intact MPFL. BONES: No aggressive osseous lesion. Nondisplaced fracture of the superior aspect of the anterolateral femoral condyle extending to the superior margin of the articular surface of the lateral trochlea and the lateral wall of the intercondylar notch. Osseous contusion of the lateral aspect of the patella. Other: No fluid collection or hematoma. Muscles are normal. IMPRESSION: 1. Nondisplaced fracture of the superior aspect of the anterolateral femoral condyle extending to the superior margin of the articular surface of the lateral trochlea and the lateral wall of the intercondylar notch. 2. Osseous contusion of the lateral aspect of the patella. 3. Large lipohemarthrosis. 4. Relative attenuation of the body of the medial meniscus with a small oblique tear of the posterior body junction of the medial meniscus. 5. Possible small tiny radial tear of the free edge of the body of the lateral meniscus seen on a single image. 6. Mild tricompartmental cartilage abnormalities as described above. Electronically Signed   By: Kathreen Devoid M.D.   On: 09/04/2021 06:04    Medications:  sodium chloride      sodium chloride   Intravenous Once   allopurinol  300 mg Oral q morning   amiodarone  200 mg Oral Daily   atorvastatin  40 mg Oral Daily   carvedilol  6.25 mg Oral BID WC   Chlorhexidine Gluconate Cloth  6 each Topical Daily    insulin aspart  0-6 Units Subcutaneous TID WC   latanoprost  1 drop Both Eyes QHS   levothyroxine  175 mcg Oral QAC breakfast   linagliptin  5 mg Oral Daily   midodrine  10 mg Oral Q M,W,F-HD   pantoprazole  40 mg Oral Daily   sildenafil  20 mg Oral TID   sodium chloride flush  3 mL Intravenous Q12H    Dialysis Orders: MWF at [facility] 3:45hr, 400/A1.5, EDW 88kg, 2K/2Ca, AVF, heparin 2000 unit bolus - Calcitriol 0.54mcg PO q HD - Anemia managed by oncology, transfusion dependent myelodysplastic syndrome. Failed high dose ESA. Not iron deficient, last tsat 93%.   Assessment/Plan:  L ankle strain/R knee nondisplaced fracture/meniscal tears: No fracture on xray, but present on MRI + CT. In ankle brace and knee immobilizer, but unable to ambulate and pain uncontrolled. Being eval for SNF placement.  ESRD:  Usual MWF schedule - HD today.  Hypertension/volume: BP soft (on pain meds), 2.5L UFG today to reach EDW.  Anemia/MDS: Hgb 8.6 today, s/p 1U PRBCs 2/21. Known transfusion-dependent MDS. Transfused q 2 weeks to keep Hgb > 8.   Metabolic bone disease: CorrCa slightly high, Phos high - tells me does not take binder at home, start  Renvela 1/meals. Holding home VDRA for now.  Veneta Penton, PA-C 09/05/2021, 8:28 AM  [name]

## 2021-09-05 NOTE — Progress Notes (Signed)
Orthopaedic Trauma Service Progress Note  Patient ID: Patricia Mata MRN: [id number] DOB/AGE: Aug 29, 1955 66 y.o.  Subjective:  Seen on HD unit C/o R knee and left ankle pain  R knee pain ok in immobilizer   CT R knee shows   ROS As above  Objective:   VITALS:   Vitals:   09/05/21 0830 09/05/21 0900 09/05/21 0930 09/05/21 1000  BP: (!) 101/44 (!) 128/43 (!) 122/43 (!) 95/39  Pulse: 71 69 77 73  Resp: 16 14 16    Temp:      TempSrc:      SpO2:      Weight:      Height:        Estimated body mass index is 34.17 kg/m as calculated from the following:   Height as of this encounter: 5\' 4"  (1.626 m).   Weight as of this encounter: 90.3 kg.   Intake/Output      02/21 0701 02/22 0700 02/22 0701 02/23 0700   P.O. 120    I.V. (mL/kg) 3 (0)    Blood 315    IV Piggyback     Total Intake(mL/kg) 438 (5)    Other 2000    Total Output 2000    Net -1562         Urine Occurrence 0 x    Stool Occurrence 0 x      LABS  Results for orders placed or performed during the hospital encounter of 09/07/2021 (from the past 24 hour(s))  Glucose, capillary     Status: Abnormal   Collection Time: 09/04/21 12:49 PM  Result Value Ref Range   Glucose-Capillary 117 (H) 70 - 99 mg/dL  Hemoglobin and hematocrit, blood     Status: Abnormal   Collection Time: 09/04/21  1:33 PM  Result Value Ref Range   Hemoglobin 9.5 (L) 12.0 - 15.0 g/dL   HCT 28.2 (L) 36.0 - 46.0 %  Glucose, capillary     Status: Abnormal   Collection Time: 09/04/21  3:39 PM  Result Value Ref Range   Glucose-Capillary 104 (H) 70 - 99 mg/dL  Glucose, capillary     Status: Abnormal   Collection Time: 09/04/21 11:34 PM  Result Value Ref Range   Glucose-Capillary 110 (H) 70 - 99 mg/dL  CBC     Status: Abnormal   Collection Time: 09/05/21  4:46 AM  Result Value Ref Range   WBC 7.8 4.0 - 10.5 K/uL   RBC 2.77 (L) 3.87 - 5.11 MIL/uL    Hemoglobin 8.6 (L) 12.0 - 15.0 g/dL   HCT 25.1 (L) 36.0 - 46.0 %   MCV 90.6 80.0 - 100.0 fL   MCH 31.0 26.0 - 34.0 pg   MCHC 34.3 30.0 - 36.0 g/dL   RDW 17.2 (H) 11.5 - 15.5 %   Platelets 121 (L) 150 - 400 K/uL   nRBC 0.0 0.0 - 0.2 %  Renal function panel     Status: Abnormal   Collection Time: 09/05/21  4:46 AM  Result Value Ref Range   Sodium 131 (L) 135 - 145 mmol/L   Potassium 4.0 3.5 - 5.1 mmol/L   Chloride 91 (L) 98 - 111 mmol/L   CO2 25 22 - 32 mmol/L   Glucose, Bld 116 (H) 70 - 99 mg/dL   BUN 42 (  H) 8 - 23 mg/dL   Creatinine, Ser 5.74 (H) 0.44 - 1.00 mg/dL   Calcium 9.2 8.9 - 10.3 mg/dL   Phosphorus 6.1 (H) 2.5 - 4.6 mg/dL   Albumin 3.1 (L) 3.5 - 5.0 g/dL   GFR, Estimated 8 (L) >60 mL/min   Anion gap 15 5 - 15  Glucose, capillary     Status: Abnormal   Collection Time: 09/05/21  6:55 AM  Result Value Ref Range   Glucose-Capillary 112 (H) 70 - 99 mg/dL   *Note: Due to a large number of results and/or encounters for the requested time period, some results have not been displayed. A complete set of results can be found in Results Review.     PHYSICAL EXAM:   Gen: lying in bed, NAD Ext:     Right Lower Extremity   Knee immobilizer in place  Exquisite tenderness R knee   Distal motor and sensory functions grossly intact  Chronic vascular changes noted to R leg   No hip or ankle pain noted      Left Lower Extremity   Swelling over lateral malleolus   TTP distal fibula, no medial tenderness  Motor and sensory functions grossly intact  Ext warm   Chronic vascular changes to R leg noted   Assessment/Plan:     66 y/o female with ESRD on HD with R distal femur fracture and L ankle pain   - Fall  - R distal femur fracture   Recommend ORIF given likelihood for it to displace  Pt in agreement   OR tomorrow am   NWB for now  Ice and elevate   Knee immobilizer  - L ankle pain and swelling   Films from 2 days ago show no fracture but extensive  demineralization   Repeat xrays but ankle complete series   - Dispo:  OR tomorrow for R distal femur      Jari Pigg, PA-C [phone] (C) 09/05/2021, 10:07 AM  [facility] [place] [id number] [phone] Jenetta [phone] (F)    After 5pm and on the weekends please log on to Amion, go to orthopaedics and the look under the [facility] Call for the provider(s) on call. You can also call our office at [phone] and then follow the prompts to be connected to the call team.   Patient ID: Patricia Mata, female   DOB: [birth year], 66 y.o.   MRN: [id number]

## 2021-09-05 NOTE — Consult Note (Addendum)
Advanced Heart Failure Team Consult Note   Primary Physician: [name], FNP PCP-Cardiologist:  Quay Burow, MD  Reason for Consultation: Preop cardiac risk assessment  HPI:    Patricia Mata is seen today for preoperative cardiac risk assessment at the request of Dr. Pietro Cassis with TR.   66 y.o. 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    Admitted 10/19  with AF with RVR and recurrent anemia. Developed respiratory failure and intubated. She was transfused and placed on IV amio. Subsequently developed AKI and placed on CVVHD. Converted to NSR on amio and weaned off the vent.Echo showed normal LVEF 65-70% with moderate AS and moderate MS and severe PH with RVSP 81mHG. She is not a candidate for intervention of AS or MS with advanced PAH and transfusion-dependent myelodysplasia as a result of her colon CA    [facility] and severely elevated PA pressures.-> V/Q scan was negative. She was started on sildenafil and tolerated well. She diuresed with IV lasix and transitioned to lasix 40 mg PO BID. Palliative care was consulted and decision was made to transition to DNR.   She follows with Dr. SAlen Blewand requires transfusions every 2-3 weeks as an outpatient. She also developed a buttock wound. WOC was consulted and recommended hydrotherapy for coccygeal osteo.   [facility]    Had [facility]20 that showed mild PAH in setting of high-output (likely due to anemia). See data below   Admitted for CAP 3/21 and treated w/ abx. Returned to hospital 3 days later for worsening SOB and admitted for acute hypoxic respiratory failure requiring intubation. Chest CT showed multifocal PNA COVID negative..A/C CHF also felt to be contributing.    Admitted 11/21 for acute on chronic CHF. Echo  11/21 EF 60-65% G2DD severe AS mean 359mG AVA 0.84cm2. Moderate MS    Admitted 12/21 for recurrent volume overload and AKI. Cr 6.45. (baseline 2.8-3.5). Diuresed. Creatinine improved to 4.9 on d/c. Weight 221. Dr. GoMoshe Ciprouggested HD but patient refused.    Admitted 10/09/20 with NSTEMI. Hospital course complicated by AKI>>ESRD. Had RHC/LHC with significant CAD with 50-60% mid LM disease but otherwise non-obstructive CAD. Structural Heart evaluated and there was concern anatomy would make TAVR difficult. Structural Heart team will re-evaluate after she starts HD. HD started   Admitted 11/21/20 with chest pain. Missed HD appointment. Cardiology/Nephrology consulted. HS trop not elevated. Hgb was down to 7.5. Given unit of blood. Had HD and discharged on 11/23/20.    Evaluated in ED 6/22 for anemia. Given 2 U PRBCs and discharged. Admitted 7/22 for anemia and HD after missing dialysis outpatient. Received 2 units PRBCs and was dialyzed.    Admitted 8/22 for anemia, hgb 5.0. GI consulted and she underwent enteroscopy showing AVM treated with APC and 2 clips.  Colonoscopy with no evidence of active bleeding. She was discharged on home dose of HF medications.   Admitted 10/22 w/ afib RVR. Cards consulted, started on IV amiodarone and converted to SR. EP felt not good candidate for Watchman or ablation. Transitioned to PO amiodarone and discharged.   Several admission for symptomatic anemia requiring transfusions in 11/22. Underwent enteroscopy 05/24/21 with single non-bleeding gastric angiectasia s/p APC.   Saw Dr. CoBurt Knackor f/u 01/23. Not currently a candidate for TAVR d/t comorbid conditions.  Echo 01/23: EF 60% severe AS mean gradient 19mHG   [facility].   Last seen for f/u in HF clinic on 02/16. Stable clinically.  Presented 02/19 after a fall at home. She was walking into living room when her legs gave out and she reports falling  on her buttocks. Afterwards, she was unable to bear weight on her right leg. Found to have right distal femur fracture. Orthopaedics consulted. ORIF recommended.   Denies dyspnea or chest pain. Denies syncope proceeding recent fall. Receiving PRN pain medications. Notes severe pain with movement or touch.  Review of Systems: [y] = yes, '[ ]'  = no   General: Weight gain '[ ]' ; Weight loss '[ ]' ; Anorexia '[ ]' ; Fatigue '[ ]' ; Fever '[ ]' ; Chills '[ ]' ; Weakness '[ ]'   Cardiac: Chest pain/pressure '[ ]' ; Resting SOB '[ ]' ; Exertional SOB '[ ]' ; Orthopnea '[ ]' ; Pedal Edema '[ ]' ; Palpitations '[ ]' ; Syncope '[ ]' ; Presyncope '[ ]' ; Paroxysmal nocturnal dyspnea'[ ]'   Pulmonary: Cough '[ ]' ; Wheezing'[ ]' ; Hemoptysis'[ ]' ; Sputum '[ ]' ; Snoring '[ ]'   GI: Vomiting'[ ]' ; Dysphagia'[ ]' ; Melena'[ ]' ; Hematochezia '[ ]' ; Heartburn'[ ]' ; Abdominal pain '[ ]' ; Constipation '[ ]' ; Diarrhea '[ ]' ; BRBPR '[ ]'   GU: Hematuria'[ ]' ; Dysuria '[ ]' ; Nocturia'[ ]'   Vascular: Pain in legs with walking '[ ]' ; Pain in feet with lying flat '[ ]' ; Non-healing sores '[ ]' ; Stroke '[ ]' ; TIA '[ ]' ; Slurred speech '[ ]' ;  Neuro: Headaches'[ ]' ; Vertigo'[ ]' ; Seizures'[ ]' ; Paresthesias'[ ]' ;Blurred vision '[ ]' ; Diplopia '[ ]' ; Vision changes '[ ]'   Ortho/Skin: Arthritis '[ ]' ; Joint pain '[ ]' ; Muscle pain '[ ]' ; Joint swelling '[ ]' ; Back Pain '[ ]' ; Rash '[ ]'   Psych: Depression'[ ]' ; Anxiety'[ ]'   Heme: Bleeding problems '[ ]' ; Clotting disorders '[ ]' ; Anemia [Y]  Endocrine: Diabetes [Y]; Thyroid dysfunction'[ ]'   Home Medications Prior to Admission medications   Medication Sig Start Date End Date Taking? Authorizing Provider  Accu-Chek Softclix Lancets lancets Use to check blood sugar 4 times a day. Dx code E11.65 Patient taking differently: daily. 07/19/21  Yes MMinette Brine FNP  acetaminophen (TYLENOL) 500 MG tablet Take 1,000 mg by mouth every 6 (six) hours as needed (pain).   Yes [provider]  allopurinol (ZYLOPRIM) 300 MG tablet Take 300 mg by mouth every morning. 07/05/20  Yes [provider]   amiodarone (PACERONE) 200 MG tablet Take 1 tablet by mouth once daily Patient taking differently: Take 200 mg by mouth daily. Take 1 tablet by mouth once daily 05/07/21  Yes Fenton, Clint R, PA  atorvastatin (LIPITOR) 40 MG tablet Take 1 tablet by mouth once daily Patient taking differently: Take 40 mg by mouth daily. 08/27/21  Yes MMinette Brine FNP  Blood Glucose Monitoring Suppl (ACCU-CHEK GUIDE ME) w/Device KIT Use to check blood sugar 4 times a day. Dx code E11.65 07/19/21  Yes MMinette Brine FNP  carvedilol (COREG) 6.25 MG tablet TAKE 1 TABLET BY MOUTH TWICE DAILY WITH A MEAL Patient taking differently: Take 6.25 mg by mouth 2 (two) times daily with a meal. 07/18/21  Yes MMinette Brine FNP  colchicine 0.6 MG tablet Take 0.6 mg by mouth daily as needed (gout flares).   Yes [provider]  diphenhydrAMINE (BENADRYL) 25 MG tablet Take 25 mg by mouth every 8 (eight) hours as needed for itching.   Yes [provider]  ethyl chloride spray Apply 1 application topically See  admin instructions. Apply topically to dialysis site if there is no time to use emla cream 03/01/21  Yes [provider]  glucose blood (ACCU-CHEK GUIDE) test strip Use to check blood sugar 4 times a day. Dx code E11.65 Patient taking differently: daily. 07/19/21  Yes [name], FNP  Insulin Glargine (BASAGLAR KWIKPEN) 100 UNIT/ML Inject 20 Units into the skin daily as needed (only if blood sugar over 150).   Yes [provider]  Insulin Pen Needle 29G X 5MM MISC Use as directed 06/16/17  Yes Bonnielee Haff, MD  latanoprost (XALATAN) 0.005 % ophthalmic solution Place 1 drop into both eyes at bedtime.    Yes [provider]  levothyroxine (SYNTHROID) 175 MCG tablet Take 1 tablet (175 mcg total) by mouth daily before breakfast. 06/18/21  Yes [name], FNP  lidocaine-prilocaine (EMLA) cream Apply 1 application topically See admin instructions. Apply to dialysis site 30 minutes before  arriving at dialysis on  Monday,Wednesday,friday 01/23/21  Yes [provider]  loratadine (CLARITIN) 10 MG tablet Take 1 tablet (10 mg total) by mouth daily as needed for allergies. 06/04/21 06/04/22 Yes [name], FNP  Methoxy PEG-Epoetin Beta (MIRCERA IJ) Mircera 05/03/21 05/02/22 Yes [provider]  midodrine (PROAMATINE) 10 MG tablet Take 10 mg by mouth See admin instructions. Dialysis days Monday,Wednesday,friday 08/13/21  Yes [provider]  Multiple Vitamin (MULTIVITAMIN WITH MINERALS) TABS tablet Take 1 tablet by mouth every morning. Centrum Silver 50+   Yes [provider]  oxyCODONE-acetaminophen (PERCOCET) 5-325 MG tablet Take 1 tablet by mouth every 4 (four) hours as needed. 09/03/21  Yes Montine Circle, PA-C  pantoprazole (PROTONIX) 40 MG tablet Take 1 tablet (40 mg total) by mouth daily. 05/26/21  Yes Shawna Clamp, MD  polyethylene glycol (MIRALAX / GLYCOLAX) 17 g packet Take 17 g by mouth daily as needed for moderate constipation. 01/14/21  Yes Vann, Jessica U, DO  sildenafil (REVATIO) 20 MG tablet TAKE 1 TABLET BY MOUTH THREE TIMES DAILY Patient taking differently: Take 20 mg by mouth 3 (three) times daily. 07/24/21  Yes Liliyana Thobe, Shaune Pascal, MD  TRADJENTA 5 MG TABS tablet Take 5 mg by mouth daily. 07/18/21  Yes [provider]  blood glucose meter kit and supplies Dispense based on patient and insurance preference. Use up to four times daily as directed. (FOR ICD-10 E10.9, E11.9). Patient taking differently: daily. 11/04/18   [name], FNP  [facility] ULTRA test strip CHECK BLOOD SUGARS TWICE DAILY 05/28/21   [name], FNP    Past Medical History: Past Medical History:  Diagnosis Date   Acute renal failure (ARF) ([place]) 06/08/2018   TTHS- [address]   Carotid artery disease [facility])    Chronic diastolic (congestive) heart failure (HCC)    Colon cancer ([place]) 2008   Diabetes mellitus ([place])    Dyspnea    ESRD on hemodialysis  ([place])    Gout 09/08/2018   Heart attack ([place])    Heart murmur    Hypertension    Hypothyroidism    Macrocytic anemia    MDS (myelodysplastic syndrome) (HCC)    Moderate mitral stenosis    PAD (peripheral artery disease) (HCC)    PAF (paroxysmal atrial fibrillation) (HCC)    Pulmonary hypertension (HCC)    Renal artery stenosis ([place])    a. >60% R renal artery stenosis by duplex 2021, managed medically.   Severe aortic stenosis    Transfusion-dependent anemia     Past Surgical History: Past Surgical History:  Procedure Laterality Date   AV FISTULA PLACEMENT Right 09/26/2020   Procedure: RIGHT ARM ARTERIOVENOUS (AV) FISTULA CREATION;  Surgeon: Waynetta Sandy, MD;  Location: [place];  Service: Vascular;  Laterality: Right;  Monitor Anesthesia Care with Regional Block to Right Arm    [place] Right 12/13/2020   Procedure: RIGHT UPPER EXTREMITY ARTERIOVENOUS FISTULA TRANSPOSITION;  Surgeon: Waynetta Sandy, MD;  Location: [place];  Service: Vascular;  Laterality: Right;   COLON SURGERY     COLONOSCOPY WITH PROPOFOL N/A 02/22/2021   Procedure: COLONOSCOPY WITH PROPOFOL;  Surgeon: Otis Brace, MD;  Location: [place];  Service: Gastroenterology;  Laterality: N/A;   DEBRIDMENT OF DECUBITUS ULCER N/A 06/12/2018   Procedure: DEBRIDMENT OF DECUBITUS ULCER;  Surgeon: Wallace Going, DO;  Location: [facility];  Service: Plastics;  Laterality: N/A;   ENTEROSCOPY N/A 02/22/2021   Procedure: ENTEROSCOPY;  Surgeon: Otis Brace, MD;  [facility];  Service: Gastroenterology;  Laterality: N/A;   ENTEROSCOPY N/A 05/24/2021   Procedure: ENTEROSCOPY;  Surgeon: Ronnette Juniper, MD;  Location: [place];  Service: Gastroenterology;  Laterality: N/A;   ESOPHAGOGASTRODUODENOSCOPY N/A 09/01/2020   Procedure: ESOPHAGOGASTRODUODENOSCOPY (EGD);  Surgeon: Clarene Essex, MD;  Location[facility];  Service: Endoscopy;  Laterality: N/A;   GIVENS CAPSULE  STUDY N/A 09/01/2020   Procedure: GIVENS CAPSULE STUDY;  Surgeon: Clarene Essex, MD;  Location[facility];  Service: Endoscopy;  Laterality: N/A;   HEMOSTASIS CLIP PLACEMENT  02/22/2021   Procedure: HEMOSTASIS CLIP PLACEMENT;  Surgeon: Otis Brace, MD;  Location: [place];  Service: Gastroenterology;;   HOT HEMOSTASIS N/A 02/22/2021   Procedure: HOT HEMOSTASIS (ARGON PLASMA COAGULATION/BICAP);  Surgeon: Otis Brace, MD;  Location: [facility] ENDOSCOPY;  Service: Gastroenterology;  Laterality: N/A;   HOT HEMOSTASIS N/A 05/24/2021   Procedure: HOT HEMOSTASIS (ARGON PLASMA COAGULATION/BICAP);  Surgeon: Ronnette Juniper, MD;  Location: [place];  Service: Gastroenterology;  Laterality: N/A;   IR FLUORO GUIDE CV LINE RIGHT  10/11/2020   IR FLUORO GUIDE PORT INSERTION RIGHT  04/02/2017   IR REMOVAL TUN ACCESS W/ PORT W/O FL MOD SED  03/16/2019   IR US GUIDE VASC ACCESS RIGHT  04/02/2017   IR US GUIDE VASC ACCESS RIGHT  10/11/2020   LIGATION OF COMPETING BRANCHES OF ARTERIOVENOUS FISTULA Right 12/13/2020   Procedure: LIGATION OF COMPETING BRANCHES OF RIGHT UPPER EXTREMITY ARTERIOVENOUS FISTULA;  Surgeon: Waynetta Sandy, MD;  Location: [place];  Service: Vascular;  Laterality: Right;   POLYPECTOMY  02/22/2021   Procedure: POLYPECTOMY;  Surgeon: Otis Brace, MD;  Location: [place] ENDOSCOPY;  Service: Gastroenterology;;   RIGHT HEART CATH N/A 04/23/2019   Procedure: RIGHT HEART CATH;  Surgeon: Jolaine Artist, MD;  Location: [place] CV LAB;  Service: Cardiovascular;  Laterality: N/A;   RIGHT/LEFT HEART CATH AND CORONARY ANGIOGRAPHY N/A 05/21/2018   Procedure: RIGHT/LEFT HEART CATH AND CORONARY ANGIOGRAPHY;  Surgeon: Nelva Bush, MD;  Location: [place] CV LAB;  Service: Cardiovascular;  Laterality: N/A;   RIGHT/LEFT HEART CATH AND CORONARY ANGIOGRAPHY N/A 10/13/2020   Procedure: RIGHT/LEFT HEART CATH AND CORONARY ANGIOGRAPHY;  Surgeon: Jolaine Artist, MD;  Location: [place] CV  LAB;  Service: Cardiovascular;  Laterality: N/A;   TUBAL LIGATION      Family History: Family History  Problem Relation Age of Onset   Hypertension Mother    Diabetes Mother    Cervical cancer Mother    Heart attack Father    Hypertension Sister    Hypertension Brother  Hypertension Sister    Hypertension Sister    Prostate cancer Brother    HIV/AIDS Brother     Social History: Social History   Socioeconomic History   Marital status: Married    Spouse name: Not on file   Number of children: Not on file   Years of education: Not on file   Highest education level: Not on file  Occupational History   Occupation: retired  Tobacco Use   Smoking status: Former    Packs/day: 0.25    Years: 20.00    Pack years: 5.00    Types: Cigarettes    Quit date: 1997    Years since quitting: 26.1   Smokeless tobacco: Former  Scientific laboratory technician Use: Never used  Substance and Sexual Activity   Alcohol use: Yes    Alcohol/week: 1.0 standard drink    Types: 1 Standard drinks or equivalent per week    Comment: occ   Drug use: Never   Sexual activity: Yes  Other Topics Concern   Not on file  Social History Narrative   Not on file   Social Determinants of Health   Financial Resource Strain: Low Risk    Difficulty of Paying Living Expenses: Not hard at all  Food Insecurity: No Food Insecurity   Worried About Charity fundraiser in the Last Year: Never true   [place] in the Last Year: Never true  Transportation Needs: No Transportation Needs   Lack of Transportation (Medical): No   Lack of Transportation (Non-Medical): No  Physical Activity: Inactive   Days of Exercise per Week: 0 days   Minutes of Exercise per Session: 0 min  Stress: No Stress Concern Present   Feeling of Stress : Not at all  Social Connections: Not on file    Allergies:  Allergies  Allergen Reactions   Lactose Intolerance (Gi) Diarrhea   Morphine And Related Itching   Sulfa Antibiotics Rash  and Other (See Comments)    Blisters, also    Objective:    Vital Signs:   Temp:  [97.5 F (36.4 C)-100.5 F (38.1 C)] 97.5 F (36.4 C) (02/22 1120) Pulse Rate:  [55-77] 65 (02/22 1200) Resp:  [11-18] 13 (02/22 1200) BP: (89-158)/(39-95) 114/55 (02/22 1200) SpO2:  [94 %-100 %] 99 % (02/22 1200) Weight:  [89.5 kg-90.3 kg] 89.5 kg (02/22 1120) Last BM Date : 09/03/21  Weight change: Filed Weights   09/04/21 1125 09/05/21 0735 09/05/21 1120  Weight: 88 kg 90.3 kg 89.5 kg    Intake/Output:   Intake/Output Summary (Last 24 hours) at 09/05/2021 1342 Last data filed at 09/05/2021 1120 Gross per 24 hour  Intake 120 ml  Output 859 ml  Net -739 ml      Physical Exam    General:  Lying in bed. Appears uncomfortable d/t pain HEENT: normal Neck: supple. No JVD Carotids 2+ bilat; no bruits.  Cor: PMI nondisplaced. Regular rate & rhythm. No rubs, gallops, 3/6 SEM RUSB with reduced A2 Lungs: clear Abdomen: soft, nontender, nondistended.  Extremities: no cyanosis, clubbing, rash, edema, RLE in immobilizer Neuro: alert & orientedx3, cranial nerves grossly intact. moves all 4 extremities w/o difficulty. Affect pleasant   Telemetry   SR 70s  Labs   Basic Metabolic Panel: Recent Labs  Lab 09/03/21 0735 09/04/21 0323 09/05/21 0446  NA 141 138 131*  K 4.2 4.4 4.0  CL 100 98 91*  CO2 '24 25 25  ' GLUCOSE 200* 120* 116*  BUN 86* 100* 42*  CREATININE 8.16* 8.75* 5.74*  CALCIUM 9.9 9.9 9.2  PHOS  --   --  6.1*    Liver Function Tests: Recent Labs  Lab 09/03/21 0735 09/05/21 0446  AST 55*  --   ALT 38  --   ALKPHOS 121  --   BILITOT 1.0  --   PROT 7.1  --   ALBUMIN 3.5 3.1*   No results for input(s): LIPASE, AMYLASE in the last 168 hours. No results for input(s): AMMONIA in the last 168 hours.  CBC: Recent Labs  Lab 09/03/21 0735 09/03/21 2130 09/04/21 0323 09/04/21 1333 09/05/21 0446  WBC 9.2 9.5 7.2  --  7.8  NEUTROABS 7.3  --   --   --   --   HGB 8.3*  8.5* 7.9* 9.5* 8.6*  HCT 24.1* 24.1* 23.9* 28.2* 25.1*  MCV 87.3 87.6 89.2  --  90.6  PLT 143* 140* 130*  --  121*    Cardiac Enzymes: No results for input(s): CKTOTAL, CKMB, CKMBINDEX, TROPONINI in the last 168 hours.  BNP: BNP (last 3 results) Recent Labs    11/21/20 1102  BNP 1,054.4*    ProBNP (last 3 results) No results for input(s): PROBNP in the last 8760 hours.   CBG: Recent Labs  Lab 09/04/21 1249 09/04/21 1539 09/04/21 2334 09/05/21 0655 09/05/21 1238  GLUCAP 117* 104* 110* 112* 99    Coagulation Studies: Recent Labs    09/03/21 1451  LABPROT 14.4  INR 1.1     Imaging   DG Ankle Complete Left  Result Date: 09/05/2021 CLINICAL DATA:  Fall.  Left ankle pain and swelling. EXAM: LEFT ANKLE COMPLETE - 3+ VIEW COMPARISON:  08/15/2021 FINDINGS: There is no evidence of fracture, dislocation, or joint effusion. Diffuse soft tissue swelling is seen, however there is no evidence of soft tissue gas or radiopaque foreign body. Generalized osteopenia is noted. Extensive peripheral vascular calcification is seen. Prominent plantar calcaneal bone spur also noted. IMPRESSION: Diffuse soft tissue swelling. No evidence of fracture or dislocation. Extensive peripheral vascular calcification. Prominent plantar calcaneal bone spur. Osteopenia. Electronically Signed   By: Marlaine Hind M.D.   On: 09/05/2021 12:58   CT KNEE RIGHT WO CONTRAST  Result Date: 09/04/2021 CLINICAL DATA:  Knee trauma, occult fracture. EXAM: CT OF THE RIGHT KNEE WITHOUT CONTRAST TECHNIQUE: Multidetector CT imaging of the right knee was performed according to the standard protocol. Multiplanar CT image reconstructions were also generated. RADIATION DOSE REDUCTION: This exam was performed according to the departmental dose-optimization program which includes automated exposure control, adjustment of the mA and/or kV according to patient size and/or use of iterative reconstruction technique. COMPARISON:  MRI  09/03/2021 FINDINGS: Bones/Joint/Cartilage Generalized osteopenia. Mildly comminuted fracture of the anterior lateral femoral condyle with a fracture cleft extending to the articular surface of the lateral trochlea. Oblique fracture cleft extends from the lateral supracondylar distal femoral metaphysis to the distal diaphysis. Normal alignment. Moderate lipohemarthrosis. No other fracture or dislocation. No aggressive osseous lesion. Ligaments Ligaments are suboptimally evaluated by CT. Muscles and Tendons Atrophy of the medial and lateral gastrocnemius muscles. No intramuscular fluid collection or hematoma. Soft tissue No fluid collection or hematoma. No soft tissue mass. Peripheral vascular atherosclerotic disease. IMPRESSION: 1. Mildly comminuted fracture of the lateral femoral condyle with a fracture cleft extending to the articular surface of the anterior lateral trochlea. Oblique fracture cleft extends from the lateral supracondylar distal femoral metaphysis to the distal diaphysis. Electronically Signed  By: Kathreen Devoid M.D.   On: 09/04/2021 14:12     Medications:     Current Medications:  sodium chloride   Intravenous Once   allopurinol  300 mg Oral q morning   amiodarone  200 mg Oral Daily   atorvastatin  40 mg Oral Daily   carvedilol  6.25 mg Oral BID WC   Chlorhexidine Gluconate Cloth  6 each Topical Daily   insulin aspart  0-6 Units Subcutaneous TID WC   latanoprost  1 drop Both Eyes QHS   levothyroxine  175 mcg Oral QAC breakfast   linagliptin  5 mg Oral Daily   midodrine  10 mg Oral Q M,W,F-HD   pantoprazole  40 mg Oral Daily   sevelamer carbonate  800 mg Oral TID WC   sildenafil  20 mg Oral TID   sodium chloride flush  3 mL Intravenous Q12H    Infusions:  sodium chloride       Assessment/Plan   R distal femur fracture -Seen by orthopaedics and planning for ORIF. -Has underlying CAD including LM disease and critical AS. She is at high to prohibitive risk for surgery  with GA. Would only proceed if patient would otherwise remain bedbound or in severe pain without surgery. In that situation, patient would need to be made aware of 10-20% risk of operative morbidity and mortality  2. Chronic diastolic HF - Echo 5/09 EF 50-55%, RV mildly reduced, degenerative mitral valve, moderate MS - Echo 01/23: EF 60-65%, RV okay, severe AS with mean gradient of 49 mmHg, AVA 0.84 cm2, mild to moderate MS with mean gradient 4.5 mmHg - Multiple hospitalizations for ADHF/AKI. - NYHA II. Volume managed on HD T/TH/Sat. Stable. - Continue carvedilol 6.25 mg bid. - Off hydralazine d/t hypotension during HD   3. Severe AS - Had cath 10/2020 AoV: Crossed easily with JR4 and J-wire. Peak-to-peak gradient 33mHg  Mean gradient 326mG AVA 1.4cm2. Anatomy may not be suitable for TAVR.  - Echo 01/23: EF 60-65%, severe AS with mean gradient 49 mmHg and AVA 0.84 cm2 - Saw Dr. CoBurt Knack1/23, not currently a good candidate for TAVR d/t comorbidities and no transfemoral access. 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  - Has not been symptomatic  4. CAD - 60% LM disease on LHC 04/22 - Denies angina - On statin - Not on aspirin with transfusion dependent anemia   5. PAF - SR on tele - Continue amiodarone 200 daily. TSH and LFTs okay 01/23. - Continue to avoid anticoagulation with transfusion dependent anemia. (chronic).  - CHA2DS2/VASc is at least 4.    6. PAH - Echo 05/12/18 LVEF 65-70%, Grade 2 DD, Mod AS, Mild/Mod MS, Mod TR, Trivial PI, PA peak pressure 93.  - L/RHC 05/21/18 with no significant CAD, severe pulmonary HTN with PA 105/40 (62) PCW 33 PVR 5.4 WU - Suspect multifactorial in nature suspect including high output due to anemia, WHO group II (diastolic HF and valvular disease) and III (OSA) but given degree of PH cannot exclude WHO Group I component. - Repeat RHC 04/23/19 with much improved pulmonary pressures. PA 57/12 (32) PCWP 14 (v to 32) - Echo 11/21 EF  60-65% G2DD severe AS mean 3569m AVA 0.84cm2. Moderate MS RVSP 51m69m- VQ negative. - PFTs 8/20 with normal spirometry and moderately reduced DLCO. - Home sleep study ordered but has not been completed. - Continue sildenafil 20 mg tid cautiously (has AS).   7. ESRD - HD M/W/F. -  Has appointment at [facility] later this month for consideration of renal transplant. Would need heart-kidney transplant, likely will not be candidate d/t her age and transfusion dependent anemia - Recently having more trouble tolerating HD d/t hypotension. Now on midodrine 10 mg TID on HD days.   8. 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 - Transfuse as needed per [place].  - CBCs followed by [place] 2 u RBCs earlier this week   9. H/o Upper GI Bleed - Continue PPI. -Hgb stable at 8.6   Length of Stay: 1  [facility], LINDSAY N, PA-C  09/05/2021, 1:42 PM  Advanced Heart Failure Team Pager [phone] (M-F; 7a - 5p)  Please contact [facility] for night-coverage after hours (4p -7a ) and weekends on [email or web address]   Patient seen and examined with the above-signed Advanced Practice Provider and/or Housestaff. I personally reviewed laboratory data, imaging studies and relevant notes. I independently examined the patient and formulated the important aspects of the plan. I have edited the note to reflect any of my changes or salient points. I have personally discussed the plan with the patient and/or family.  Ask to provide pre-op CV risk stratification in setting of acute femur fracture.   Patient well known to me from clinic.   65 y/o woman  with critical AS, ESRD, transfusion-dependent anemia. CAD with left main disease and PAH.  Despite her heart disease has remained fairly functional and minimally symptomatic.  Admitted with mechanical fall with non-displaced R femur fracture. Denies CP or SOB.   General:  Sitting up. No resp difficulty HEENT: normal Neck:  supple. no JVD. Carotids 2+ bilat; + bruits. No lymphadenopathy or thryomegaly appreciated. Cor: PMI nondisplaced. Regular rate & rhythm. 2/6 AS Lungs: clear Abdomen: soft, nontender, nondistended. No hepatosplenomegaly. No bruits or masses. Good bowel sounds. Extremities: no cyanosis, clubbing, rash, edema Neuro: alert & orientedx3, cranial nerves grossly intact. moves all 4 extremities w/o difficulty. Affect pleasant   Although she remains fairly function, given CAD and critical AS she would be high to prohibitive risk for surgery with GA. Would only proceed if patient would otherwise remain bedbound or in severe pain without surgery. In that situation patient would have to be apprised of at least 10-20% risk of severe operative morbidity and mortality.  D/w Trauma team.  Glori Bickers, MD  6:31 PM

## 2021-09-06 ENCOUNTER — Encounter (HOSPITAL_COMMUNITY): Admission: EM | Disposition: E | Payer: Self-pay | Source: Home / Self Care | Attending: Internal Medicine

## 2021-09-06 DIAGNOSIS — I350 Nonrheumatic aortic (valve) stenosis: Secondary | ICD-10-CM | POA: Diagnosis not present

## 2021-09-06 DIAGNOSIS — M25561 Pain in right knee: Secondary | ICD-10-CM | POA: Diagnosis not present

## 2021-09-06 DIAGNOSIS — I5032 Chronic diastolic (congestive) heart failure: Secondary | ICD-10-CM | POA: Diagnosis not present

## 2021-09-06 LAB — BASIC METABOLIC PANEL
Anion gap: 18 — ABNORMAL HIGH (ref 5–15)
BUN: 31 mg/dL — ABNORMAL HIGH (ref 8–23)
CO2: 22 mmol/L (ref 22–32)
Calcium: 8.8 mg/dL — ABNORMAL LOW (ref 8.9–10.3)
Chloride: 94 mmol/L — ABNORMAL LOW (ref 98–111)
Creatinine, Ser: 4.56 mg/dL — ABNORMAL HIGH (ref 0.44–1.00)
GFR, Estimated: 10 mL/min — ABNORMAL LOW (ref >60)
Glucose, Bld: 152 mg/dL — ABNORMAL HIGH (ref 70–99)
Potassium: 4.1 mmol/L (ref 3.5–5.1)
Sodium: 134 mmol/L — ABNORMAL LOW (ref 135–145)

## 2021-09-06 LAB — CBC WITH DIFFERENTIAL/PLATELET
Abs Immature Granulocytes: 0.11 10*3/uL — ABNORMAL HIGH (ref 0.00–0.07)
Basophils Absolute: 0 10*3/uL (ref 0.0–0.1)
Basophils Relative: 0 %
Eosinophils Absolute: 0 10*3/uL (ref 0.0–0.5)
Eosinophils Relative: 0 %
HCT: 26.2 % — ABNORMAL LOW (ref 36.0–46.0)
Hemoglobin: 8.6 g/dL — ABNORMAL LOW (ref 12.0–15.0)
Immature Granulocytes: 1 %
Lymphocytes Relative: 3 %
Lymphs Abs: 0.5 10*3/uL — ABNORMAL LOW (ref 0.7–4.0)
MCH: 30.3 pg (ref 26.0–34.0)
MCHC: 32.8 g/dL (ref 30.0–36.0)
MCV: 92.3 fL (ref 80.0–100.0)
Monocytes Absolute: 1.6 10*3/uL — ABNORMAL HIGH (ref 0.1–1.0)
Monocytes Relative: 8 %
Neutro Abs: 16.2 10*3/uL — ABNORMAL HIGH (ref 1.7–7.7)
Neutrophils Relative %: 88 %
Platelets: 129 10*3/uL — ABNORMAL LOW (ref 150–400)
RBC: 2.84 MIL/uL — ABNORMAL LOW (ref 3.87–5.11)
RDW: 17.8 % — ABNORMAL HIGH (ref 11.5–15.5)
WBC: 18.4 10*3/uL — ABNORMAL HIGH (ref 4.0–10.5)
nRBC: 0.2 % (ref 0.0–0.2)

## 2021-09-06 LAB — GLUCOSE, CAPILLARY
Glucose-Capillary: 168 mg/dL — ABNORMAL HIGH (ref 70–99)
Glucose-Capillary: 230 mg/dL — ABNORMAL HIGH (ref 70–99)
Glucose-Capillary: 245 mg/dL — ABNORMAL HIGH (ref 70–99)
Glucose-Capillary: 255 mg/dL — ABNORMAL HIGH (ref 70–99)

## 2021-09-06 SURGERY — OPEN REDUCTION INTERNAL FIXATION (ORIF) DISTAL FEMUR FRACTURE
Anesthesia: Choice | Laterality: Right

## 2021-09-06 MED ORDER — INSULIN GLARGINE-YFGN 100 UNIT/ML ~~LOC~~ SOLN: 10.0000 [IU] | Freq: Every day | SUBCUTANEOUS | Status: DC

## 2021-09-06 MED ORDER — ONDANSETRON HCL 4 MG/2ML IJ SOLN: 4.0000 mg | Freq: Four times a day (QID) | INTRAMUSCULAR | Status: DC | PRN

## 2021-09-06 MED ORDER — DOCUSATE SODIUM 100 MG PO CAPS: 100.0000 mg | ORAL_CAPSULE | Freq: Every day | ORAL | Status: DC | PRN

## 2021-09-06 MED ORDER — SENNOSIDES-DOCUSATE SODIUM 8.6-50 MG PO TABS
1.0000 | ORAL_TABLET | Freq: Every day | ORAL | Status: DC
  Administered 2021-09-06: 1 via ORAL
  Filled 2021-09-06: qty 1

## 2021-09-06 MED ORDER — ONDANSETRON HCL 4 MG/2ML IJ SOLN
4.0000 mg | Freq: Four times a day (QID) | INTRAMUSCULAR | Status: AC | PRN
  Administered 2021-09-06 – 2021-09-07 (×2): 4 mg via INTRAVENOUS
  Filled 2021-09-06 (×2): qty 2

## 2021-09-06 MED ORDER — BISACODYL 10 MG RE SUPP
10.0000 mg | Freq: Every day | RECTAL | Status: DC | PRN
Start: 2021-09-06 — End: 2021-09-07

## 2021-09-06 MED ORDER — INSULIN GLARGINE-YFGN 100 UNIT/ML ~~LOC~~ SOLN
5.0000 [IU] | Freq: Every day | SUBCUTANEOUS | Status: DC
  Administered 2021-09-06 – 2021-09-07 (×2): 5 [IU] via SUBCUTANEOUS
  Filled 2021-09-06 (×2): qty 0.05

## 2021-09-06 MED ORDER — PROSOURCE PLUS PO LIQD
30.0000 mL | Freq: Two times a day (BID) | ORAL | Status: DC
  Administered 2021-09-06 – 2021-09-07 (×3): 30 mL via ORAL
  Filled 2021-09-06 (×3): qty 30

## 2021-09-06 MED ORDER — POLYETHYLENE GLYCOL 3350 17 G PO PACK
17.0000 g | PACK | Freq: Every day | ORAL | Status: DC | PRN
  Administered 2021-09-06: 17 g via ORAL
  Filled 2021-09-06: qty 1

## 2021-09-06 NOTE — Progress Notes (Signed)
PROGRESS NOTE  [name]  DOB: 12/16/1955  PCP: [name], [place] VZD:[id number]  DOA: 09/09/2021  LOS: 2 days  Hospital Day: 5  Brief narrative: Patricia Mata is a 66 y.o. female with PMH significant for ESRD HD MWF, DM 2, HTN, diastolic CHF, pulmonary hypertension, severe aortic stenosis, carotid artery disease, peripheral artery disease, PAF, hypothyroidism, hx of colon cancer s/p hemicolectomy and chemo with resultant MDS and transfusion dependent anemia. Patient presented to the ED on 09/01/2021 after a fall hurting her right knee and inability to walk.  X-ray right knee on admission showed degenerative change without acute abnormality.  But because of significant pain and inability to walk, patient was hospitalized. Next day on 2/20, MRI right knee was obtained which showed a nondisplaced fracture of the superior aspect of the anterolateral femoral condyle extending to the superior margin of the articular surface of the lateral trochlea and the lateral wall of the intercondylar notch.   Orthopedics was consulted.   Subjective: Patient was seen and examined this morning.  Lying down in bed.  She she gets an extreme episode of right knee pain even on touching her right foot.  Sister at bedside.  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   -Initial x-ray did not show an acute abnormality but MRI right knee with  nondisplaced fracture of the femoral condyle with intra-articular extension.   -Orthopedics and cardiology consult appreciated.  Because of significant cardiac risk with surgery, orthopedics recommended nonoperative management. -Continue pain control currently on as needed oxycodone, as needed IV Dilaudid, as needed Flexeril  ESRD HD MWF -Nephrology following.  Aortic stenosis, severe -Echo 07/2021 showed severe calcification and thickening of the aortic valve resulting severe aortic stenosis. -Followed by Dr. Burt Knack, severe but stable AS -Per his note in 07/2021: comorbid medical conditions remain prohibitive for consideration of TAVR.   Chronic diastolic CHF (congestive heart failure)  -Appears euvolemic. 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 Alen Blew. -Current therapy:  Reblozyl 75 mg subcutaneous injection every 3 weeks started in December 2021.  -Red cell transfusion to keep hemoglobin above 8. -Hemoglobin was at 7.9 on 2/21.  1 unit of PRBC given.  Hemoglobin 8.6 yesterday and today. -Continue to monitor. Recent Labs    10/09/20 1656 10/10/20 0939 12/26/20 1515 01/03/21 0849 05/13/21 2314 05/14/21 0223 05/15/21 0449 09/03/21 2130 09/04/21 0323 09/04/21 1333 09/05/21 0446 09/03/2021 0208  HGB  --    < >  --    < >  --  7.1*   < > 8.5* 7.9* 9.5* 8.6* 8.6*  MCV  --    < >  --    < >  --  88.1   < > 87.6 89.2  --  90.6 92.3  VITAMINB12  --   --   --   --   --  1,155*  --   --   --   --   --   --   FOLATE  --   --   --   --   --  15.7  --   --   --   --   --   --   FERRITIN  --   --   --   --   --  2,121*  --   --   --   --   --   --   TIBC  --   --   --   --   --   209*  --   --   --   --   --   --   IRON  --   --   --   --   --  204*  --   --   --   --   --   --   RETICCTPCT 1.8  --  1.4  --  2.2 2.7  --   --   --   --   --   --    < > = values in this interval not displayed.   Type 2 diabetes mellitus -A1c 5.9 on 06/2021 -Home meds include Tradjenta -Currently on sliding scale insulin only.  Blood sugar level trending up, 230 this morning. -I will start her on Semglee 5 units from today. Recent Labs  Lab 09/05/21 1238 09/05/21 1628 09/05/21 2015 08/22/2021 0645 08/20/2021 1144  GLUCAP 99 123* 151* 168* 230*   Hyperlipidemia  -Continue lipitor    Gout  Continue allopurinol    Hypothyroidism -TSH wnl 07/2021 -Continue home synthroid at 189mcg   Obesity -Body mass index is 33.87 kg/m. Patient has been advised to make an attempt to improve diet and exercise patterns to aid in weight loss.  Mobility: PT eval postprocedure required Goals of care   Code Status: Full Code    Nutritional status:  Body mass index is 33.87 kg/m.      Diet:  Diet Order             Diet renal/carb modified with fluid restriction Diet-HS Snack? Nothing; Fluid restriction: 1200 mL Fluid; Room service appropriate? Yes; Fluid consistency: Thin  Diet effective now                   DVT prophylaxis:  SCDs Start: 09/03/21 1341   Antimicrobials: None Fluid: None Consultants: Nephrology, orthopedics, cardiology Family Communication: None at bedside  Status is: Inpatient  Continue in-hospital care because: Nonoperative management of fracture, PT eval, pain control Level of care: Telemetry Medical   Dispo: The patient is from: Home              Anticipated d/c is to: SNF ultimately after adequate pain control              Patient currently is not medically stable to d/c.   Difficult to place patient No     Infusions:   sodium chloride      Scheduled Meds:  (feeding supplement) PROSource Plus  30 mL Oral BID BM   sodium chloride    Intravenous Once   allopurinol  300 mg Oral q morning   amiodarone  200 mg Oral Daily   atorvastatin  40 mg  Oral Daily   carvedilol  6.25 mg Oral BID WC   Chlorhexidine Gluconate Cloth  6 each Topical Daily   insulin aspart  0-6 Units Subcutaneous TID WC   insulin glargine-yfgn  5 Units Subcutaneous Daily   latanoprost  1 drop Both Eyes QHS   levothyroxine  175 mcg Oral QAC breakfast   linagliptin  5 mg Oral Daily   midodrine  10 mg Oral Q M,W,F-HD   pantoprazole  40 mg Oral Daily   senna-docusate  1 tablet Oral QHS   sevelamer carbonate  800 mg Oral TID WC   sildenafil  20 mg Oral TID   sodium chloride flush  3 mL Intravenous Q12H    PRN meds: sodium chloride, acetaminophen, bisacodyl, cyclobenzaprine, diphenhydrAMINE, HYDROmorphone (DILAUDID) injection, lip balm, oxyCODONE, polyethylene glycol, sodium chloride flush, traZODone   Antimicrobials: Anti-infectives (From admission, onward)    None       Objective: Vitals:   08/17/2021 0719 08/22/2021 0812  BP: (!) 147/43 (!) 169/46  Pulse:  82  Resp: (!) 24 16  Temp: 99.1 F (37.3 C)   SpO2:      Intake/Output Summary (Last 24 hours) at 08/22/2021 1307 Last data filed at 09/05/2021 1820 Gross per 24 hour  Intake 120 ml  Output --  Net 120 ml   Filed Weights   09/04/21 1125 09/05/21 0735 09/05/21 1120  Weight: 88 kg 90.3 kg 89.5 kg   Weight change: -0.7 kg Body mass index is 33.87 kg/m.   Physical Exam: General exam: Pleasant, elderly African-American female. Skin: No rashes, lesions or ulcers. HEENT: Atraumatic, normocephalic, no obvious bleeding Lungs: Clear to auscultation bilaterally CVS: Regular rate and rhythm, no murmur GI/Abd soft, nontender, nondistended, bowel sound present CNS: Alert, awake, oriented x3 Psychiatry: Sad affect because of episodes of acute episodic pain Extremities: No pedal edema, no calf tenderness.  Right knee tender even on gentle touch  Data Review: I have personally reviewed the  laboratory data and studies available.  F/u labs ordered Unresulted Labs (From admission, onward)     Start     Ordered   08/15/2021 0500  CBC with Differential/Platelet  Daily,   R     Question:  Specimen collection method  Answer:  Lab=Lab collect   09/05/21 1058   09/05/2021 7510  Basic metabolic panel  Daily,   R     Question:  Specimen collection method  Answer:  Lab=Lab collect   09/05/21 1058   Signed and Held  Renal function panel  Once,   R       Question:  Specimen collection method  Answer:  Lab=Lab collect   Signed and Held   Signed and Held  CBC  Once,   R       Question:  Specimen collection method  Answer:  Lab=Lab collect   Signed and Held            Signed, Terrilee Croak, MD [facility] 09/10/2021

## 2021-09-06 NOTE — Progress Notes (Signed)
[facility] Progress Note   Subjective:  Seen in room. Looks like knee surgery cancelled d/t high cardiac risk. C/o R hip and R knee pain today. No CP/dyspnea.  Objective Vitals:   08/23/2021 0520 08/29/2021 0550 08/15/2021 0719 08/28/2021 0812  BP: (!) 159/49 (!) 145/45 (!) 147/43 (!) 169/46  Pulse:    82  Resp: 20 (!) 25 (!) 24 16  Temp:   99.1 F (37.3 C)   TempSrc:   Oral   SpO2:      Weight:      Height:       Physical Exam General: Well appearing woman, NAD. Room air. Heart: RRR; no murmur Lungs: CTA anteriorly Abdomen: soft Extremities: No LE edema; R leg in large soft brace Dialysis Access: RUE AVF + bruit   Additional Objective Labs: Basic Metabolic Panel: Recent Labs  Lab 09/04/21 0323 09/05/21 0446 09/07/2021 0208  NA 138 131* 134*  K 4.4 4.0 4.1  CL 98 91* 94*  CO2 25 25 22   GLUCOSE 120* 116* 152*  BUN 100* 42* 31*  CREATININE 8.75* 5.74* 4.56*  CALCIUM 9.9 9.2 8.8*  PHOS  --  6.1*  --    Liver Function Tests: Recent Labs  Lab 09/03/21 0735 09/05/21 0446  AST 55*  --   ALT 38  --   ALKPHOS 121  --   BILITOT 1.0  --   PROT 7.1  --   ALBUMIN 3.5 3.1*   CBC: Recent Labs  Lab 09/03/21 0735 09/03/21 2130 09/04/21 0323 09/04/21 1333 09/05/21 0446 08/27/2021 0208  WBC 9.2 9.5 7.2  --  7.8 18.4*  NEUTROABS 7.3  --   --   --   --  16.2*  HGB 8.3* 8.5* 7.9* 9.5* 8.6* 8.6*  HCT 24.1* 24.1* 23.9* 28.2* 25.1* 26.2*  MCV 87.3 87.6 89.2  --  90.6 92.3  PLT 143* 140* 130*  --  121* 129*   Studies/Results: DG Ankle Complete Left  Result Date: 09/05/2021 CLINICAL DATA:  Fall.  Left ankle pain and swelling. EXAM: LEFT ANKLE COMPLETE - 3+ VIEW COMPARISON:  08/31/2021 FINDINGS: There is no evidence of fracture, dislocation, or joint effusion. Diffuse soft tissue swelling is seen, however there is no evidence of soft tissue gas or radiopaque foreign body. Generalized osteopenia is noted. Extensive peripheral vascular calcification is seen.  Prominent plantar calcaneal bone spur also noted. IMPRESSION: Diffuse soft tissue swelling. No evidence of fracture or dislocation. Extensive peripheral vascular calcification. Prominent plantar calcaneal bone spur. Osteopenia. Electronically Signed   By: Marlaine Hind M.D.   On: 09/05/2021 12:58   CT KNEE RIGHT WO CONTRAST  Result Date: 09/04/2021 CLINICAL DATA:  Knee trauma, occult fracture. EXAM: CT OF THE RIGHT KNEE WITHOUT CONTRAST TECHNIQUE: Multidetector CT imaging of the right knee was performed according to the standard protocol. Multiplanar CT image reconstructions were also generated. RADIATION DOSE REDUCTION: This exam was performed according to the departmental dose-optimization program which includes automated exposure control, adjustment of the mA and/or kV according to patient size and/or use of iterative reconstruction technique. COMPARISON:  MRI 09/03/2021 FINDINGS: Bones/Joint/Cartilage Generalized osteopenia. Mildly comminuted fracture of the anterior lateral femoral condyle with a fracture cleft extending to the articular surface of the lateral trochlea. Oblique fracture cleft extends from the lateral supracondylar distal femoral metaphysis to the distal diaphysis. Normal alignment. Moderate lipohemarthrosis. No other fracture or dislocation. No aggressive osseous lesion. Ligaments Ligaments are suboptimally evaluated by CT. Muscles and Tendons Atrophy of the medial  and lateral gastrocnemius muscles. No intramuscular fluid collection or hematoma. Soft tissue No fluid collection or hematoma. No soft tissue mass. Peripheral vascular atherosclerotic disease. IMPRESSION: 1. Mildly comminuted fracture of the lateral femoral condyle with a fracture cleft extending to the articular surface of the anterior lateral trochlea. Oblique fracture cleft extends from the lateral supracondylar distal femoral metaphysis to the distal diaphysis. Electronically Signed   By: Kathreen Devoid M.D.   On: 09/04/2021  14:12    Medications:  sodium chloride      sodium chloride   Intravenous Once   allopurinol  300 mg Oral q morning   amiodarone  200 mg Oral Daily   atorvastatin  40 mg Oral Daily   carvedilol  6.25 mg Oral BID WC   Chlorhexidine Gluconate Cloth  6 each Topical Daily   insulin aspart  0-6 Units Subcutaneous TID WC   latanoprost  1 drop Both Eyes QHS   levothyroxine  175 mcg Oral QAC breakfast   linagliptin  5 mg Oral Daily   midodrine  10 mg Oral Q M,W,F-HD   pantoprazole  40 mg Oral Daily   senna-docusate  1 tablet Oral QHS   sevelamer carbonate  800 mg Oral TID WC   sildenafil  20 mg Oral TID   sodium chloride flush  3 mL Intravenous Q12H    Dialysis Orders: MWF at [facility] 3:45hr, 400/A1.5, EDW 88kg, 2K/2Ca, AVF, heparin 2000 unit bolus - Calcitriol 0.70mcg PO q HD - Anemia managed by oncology, transfusion dependent myelodysplastic syndrome. Failed high dose ESA. Not iron deficient, last tsat 93%.   Assessment/Plan:  L ankle strain/R knee nondisplaced fracture/meniscal tears: No fracture on xray, but present on MRI + CT. In ankle brace and knee immobilizer, but unable to ambulate and pain uncontrolled. Being eval for SNF placement. Leukocytosis: WBC way up today, unclear why? Per primary.  ESRD:  Usual MWF schedule - HD tomorrow.  Hypertension/volume: BP soft (on pain meds), UF as tolerated. Looks euvolemic.  Anemia/MDS: Hgb 8.6 today, s/p 1U PRBCs 2/21. Known transfusion-dependent MDS. Transfused q 2 weeks to keep Hgb > 8.   Metabolic bone disease: CorrCa slightly high, Phos high - tells me does not take binder at home, started Renvela 1/meals on 2/22. Holding home VDRA for now. Nutrition: Alb low, adding protein supplements. T2DM pA-Fib Severe AS Pulm HTN  Patricia Mata, Hershal Coria 08/20/2021, 10:20 AM  [name]

## 2021-09-06 NOTE — Progress Notes (Deleted)
PT Cancellation Note  Patient Details Name: Donell Sliwinski MRN: [id number] DOB: 27-Sep-1955   Cancelled Treatment:    Reason Eval/Treat Not Completed: Per ortho, to OR today for R distal femur fx ORIF. Will follow-up for PT Treatment post-op.  Mabeline Caras, PT, DPT Acute Rehabilitation Services  Pager [phone] Office [place] 09/03/2021, 8:11 AM

## 2021-09-06 NOTE — Progress Notes (Addendum)
Advanced Heart Failure Rounding Note  PCP-Cardiologist: Quay Burow, MD   Subjective:    Complaining of right hip pain and some nausea.   Objective:   Weight Range: 89.5 kg Body mass index is 33.87 kg/m.   Vital Signs:   Temp:  [97.5 F (36.4 C)-99.3 F (37.4 C)] 99.1 F (37.3 C) (02/23 0719) Pulse Rate:  [55-82] 82 (02/23 0812) Resp:  [13-25] 16 (02/23 0812) BP: (89-169)/(39-56) 169/46 (02/23 0812) SpO2:  [99 %-100 %] 99 % (02/22 2018) Weight:  [89.5 kg] 89.5 kg (02/22 1120) Last BM Date : 09/03/21  Weight change: Filed Weights   09/04/21 1125 09/05/21 0735 09/05/21 1120  Weight: 88 kg 90.3 kg 89.5 kg    Intake/Output:   Intake/Output Summary (Last 24 hours) at 09/07/2021 0836 Last data filed at 09/05/2021 1820 Gross per 24 hour  Intake 120 ml  Output 859 ml  Net -739 ml      Physical Exam    General:  No resp difficulty HEENT: Normal Neck: Supple. JVP 7-8  Carotids 2+ bilat; no bruits. No lymphadenopathy or thyromegaly appreciated. Cor: PMI nondisplaced. Regular rate & rhythm. No rubs, gallops or murmurs. Lungs: Clear Abdomen: Soft, nontender, nondistended. No hepatosplenomegaly. No bruits or masses. Good bowel sounds. Extremities: No cyanosis, clubbing, rash, edema. RLL immobilizer.  Neuro: Alert & orientedx3, cranial nerves grossly intact. moves all 4 extremities w/o difficulty. Affect pleasant   Telemetry   SR 60-70s   EKG    N/A  Labs    CBC Recent Labs    09/05/21 0446 09/11/2021 0208  WBC 7.8 18.4*  NEUTROABS  --  16.2*  HGB 8.6* 8.6*  HCT 25.1* 26.2*  MCV 90.6 92.3  PLT 121* 947*   Basic Metabolic Panel Recent Labs    09/05/21 0446 08/24/2021 0208  NA 131* 134*  K 4.0 4.1  CL 91* 94*  CO2 25 22  GLUCOSE 116* 152*  BUN 42* 31*  CREATININE 5.74* 4.56*  CALCIUM 9.2 8.8*  PHOS 6.1*  --    Liver Function Tests Recent Labs    09/05/21 0446  ALBUMIN 3.1*   No results for input(s): LIPASE, AMYLASE in the last 72  hours. Cardiac Enzymes No results for input(s): CKTOTAL, CKMB, CKMBINDEX, TROPONINI in the last 72 hours.  BNP: BNP (last 3 results) Recent Labs    11/21/20 1102  BNP 1,054.4*    ProBNP (last 3 results) No results for input(s): PROBNP in the last 8760 hours.   D-Dimer No results for input(s): DDIMER in the last 72 hours. Hemoglobin A1C No results for input(s): HGBA1C in the last 72 hours. Fasting Lipid Panel No results for input(s): CHOL, HDL, LDLCALC, TRIG, CHOLHDL, LDLDIRECT in the last 72 hours. Thyroid Function Tests No results for input(s): TSH, T4TOTAL, T3FREE, THYROIDAB in the last 72 hours.  Invalid input(s): FREET3  Other results:   Imaging    DG Ankle Complete Left  Result Date: 09/05/2021 CLINICAL DATA:  Fall.  Left ankle pain and swelling. EXAM: LEFT ANKLE COMPLETE - 3+ VIEW COMPARISON:  09/05/2021 FINDINGS: There is no evidence of fracture, dislocation, or joint effusion. Diffuse soft tissue swelling is seen, however there is no evidence of soft tissue gas or radiopaque foreign body. Generalized osteopenia is noted. Extensive peripheral vascular calcification is seen. Prominent plantar calcaneal bone spur also noted. IMPRESSION: Diffuse soft tissue swelling. No evidence of fracture or dislocation. Extensive peripheral vascular calcification. Prominent plantar calcaneal bone spur. Osteopenia. Electronically Signed   By:  Marlaine Hind M.D.   On: 09/05/2021 12:58     Medications:     Scheduled Medications:  sodium chloride   Intravenous Once   allopurinol  300 mg Oral q morning   amiodarone  200 mg Oral Daily   atorvastatin  40 mg Oral Daily   carvedilol  6.25 mg Oral BID WC   Chlorhexidine Gluconate Cloth  6 each Topical Daily   insulin aspart  0-6 Units Subcutaneous TID WC   latanoprost  1 drop Both Eyes QHS   levothyroxine  175 mcg Oral QAC breakfast   linagliptin  5 mg Oral Daily   midodrine  10 mg Oral Q M,W,F-HD   pantoprazole  40 mg Oral Daily    senna-docusate  1 tablet Oral QHS   sevelamer carbonate  800 mg Oral TID WC   sildenafil  20 mg Oral TID   sodium chloride flush  3 mL Intravenous Q12H    Infusions:  sodium chloride      PRN Medications: sodium chloride, acetaminophen, bisacodyl, cyclobenzaprine, diphenhydrAMINE, HYDROmorphone (DILAUDID) injection, insulin glargine-yfgn, lip balm, oxyCODONE, polyethylene glycol, sodium chloride flush, traZODone   Assessment/Plan   R distal femur fracture -Seen by orthopaedics and planning for ORIF. -Has underlying CAD including LM disease and critical AS. She is at high to prohibitive risk for surgery with GA. Would only proceed if patient would otherwise remain bedbound or in severe pain without surgery.  - Per Dr Haroldine Laws 20%  risk of severe operative morbidity and mortality - Ortho cancelled surgery.     2. Chronic diastolic HF - Echo 0/10 EF 50-55%, RV mildly reduced, degenerative mitral valve, moderate MS - Echo 01/23: EF 60-65%, RV okay, severe AS with mean gradient of 49 mmHg, AVA 0.84 cm2, mild to moderate MS with mean gradient 4.5 mmHg - Multiple hospitalizations for ADHF/AKI. -  Volume managed on HD T/TH/Sat. Stable. - Continue carvedilol 6.25 mg bid. - Off hydralazine d/t hypotension during HD   3. Severe AS - Had cath 10/2020 AoV: Crossed easily with JR4 and J-wire. Peak-to-peak gradient 30mmHg  Mean gradient 78mmHG AVA 1.4cm2. Anatomy may not be suitable for TAVR.  - Echo 01/23: EF 60-65%, severe AS with mean gradient 49 mmHg and AVA 0.84 cm2 - Saw Dr. Burt Knack 01/23, not currently a good candidate for TAVR d/t comorbidities and no transfemoral access. 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  - Has not been symptomatic   4. CAD - 60% LM disease on LHC 04/22 - On statin - Not on aspirin with transfusion dependent anemia   5. PAF - Maintaining SR - Continue amiodarone 200 daily. TSH and LFTs okay 01/23. - Continue to avoid anticoagulation with  transfusion dependent anemia. (chronic).  - CHA2DS2/VASc is at least 4.    6. PAH - Echo 05/12/18 LVEF 65-70%, Grade 2 DD, Mod AS, Mild/Mod MS, Mod TR, Trivial PI, PA peak pressure 93.  - L/RHC 05/21/18 with no significant CAD, severe pulmonary HTN with PA 105/40 (62) PCW 33 PVR 5.4 WU - Suspect multifactorial in nature suspect including high output due to anemia, WHO group II (diastolic HF and valvular disease) and III (OSA) but given degree of PH cannot exclude WHO Group I component. - Repeat RHC 04/23/19 with much improved pulmonary pressures. PA 57/12 (32) PCWP 14 (v to 32) - Echo 11/21 EF 60-65% G2DD severe AS mean 51mmHG AVA 0.84cm2. Moderate MS RVSP 37mmHG - VQ negative. - PFTs 8/20 with normal spirometry and  moderately reduced DLCO. - Home sleep study ordered but has not been completed. - Continue sildenafil 20 mg tid cautiously (has AS).    7. ESRD - HD M/W/F. - Has appointment at [facility] later this month for consideration of renal transplant. Would need heart-kidney transplant, likely will not be candidate d/t her age and transfusion dependent anemia - Recently having more trouble tolerating HD d/t hypotension. Now on midodrine 10 mg TID on HD days.   8. 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 - Transfuse as needed per [facility].  - CBCs followed by [facility] 2 u RBCs earlier this week   9. H/o Upper GI Bleed - Continue PPI. -Hgb stable at 8.6 today.   HF Team will follow from a distance.    Length of Stay: 2  Darrick Grinder, NP  08/27/2021, 8:36 AM  Advanced Heart Failure Team Pager [phone] (M-F; 7a - 5p)  Please contact [facility] for night-coverage after hours (5p -7a ) and weekends on [email or web address]  Patient seen and examined with the above-signed Advanced Practice Provider and/or Housestaff. I personally reviewed laboratory data, imaging studies and relevant notes. I independently examined the patient and formulated the  important aspects of the plan. I have edited the note to reflect any of my changes or salient points. I have personally discussed the plan with the patient and/or family.  Denies CP or SOB. Still with hip pain   General:  Chronically-ill appearing. No resp difficulty HEENT: normal Neck: supple. no JVD. Carotids 2+ bilat; + bruits. No lymphadenopathy or thryomegaly appreciated. Cor: PMI nondisplaced. Regular rate & rhythm. 2/6 AS Lungs: clear Abdomen: soft, nontender, nondistended. No hepatosplenomegaly. No bruits or masses. Good bowel sounds. Extremities: no cyanosis, clubbing, rash, edema + RLE brace Neuro: alert & orientedx3, cranial nerves grossly intact. moves all 4 extremities w/o difficulty. Affect pleasant  Stable from CV standpoint. Very high risk for surgical intervention. Have opted for medical management. HF team will s/o. Please call with questions.   Glori Bickers, MD  3:53 PM

## 2021-09-06 NOTE — Progress Notes (Signed)
After discussion with Cardiology, surgical risks are too high for surgical intervention with this nondisplaced fracture and so surgery has been cancelled. Instead she will continue with gentle AAROM, NWB, and super well padded immobilizer vs no bracing because of her skin issues.  Altamese Mingo, MD [facility], [facility] [phone]

## 2021-09-06 NOTE — Progress Notes (Signed)
Pt had one occurrence of bile/yellow emesis about 566ml. She c/o RLQ abdominal pain and tenderness.  Blount, NP aware, new orders placed.  CBG 255. Blount, NP aware.

## 2021-09-06 NOTE — Progress Notes (Signed)
Pt c/o new onset of RLQ abdominal pain. She rated pain 8/10 and described it as constant aching, very tender to touch and radiates to right leg. Did not have BM since 20th and did not urinate , but she is HD pt. Bladder scan showed 11ml of urine. WBC this morning was 18.4 (7.8 yesterday). Temp 99.3, other VS normal.  No acute distress, mental status unchanged. Olena Heckle, NP notified. No new orders at this time.

## 2021-09-06 NOTE — Progress Notes (Addendum)
Physical Therapy Treatment Patient Details Name: Patricia Mata MRN: [id number] DOB: February 21, 1956 Today's Date: 09/09/2021   History of Present Illness Pt is a 66 y/o female admitted 08/26/2021 after a fall where her legs gave out. Initial imaging negative for fxs in L ankle and R knee. Further workup revealed R distal femur fx; plan for non-operative management as pt deemed too high risk for surgical intervention. PMH includes HTN, DM2, PAF, CKD, colon CA.   PT Comments    Pt slowly progressing with mobility; limited by fatigue(?)/flat affect this session, which pt and husband attribute to medications; difficult to determine extent of true cognitive impairment as pt with minimal verbal responses to questions this session. Pt tolerated supine<>sit with mod-maxA; could not progress lateral scooting or standing attempts due to pain and decreased movement initiation; poor tolerance for initiation of RLE AAROM. Continue to recommend SNF-level therapies to maximize functional mobility and decrease caregiver burden prior to return home.    Recommendations for follow up therapy are one component of a multi-disciplinary discharge planning process, led by the attending physician.  Recommendations may be updated based on patient status, additional functional criteria and insurance authorization.  Follow Up Recommendations  Skilled nursing-short term rehab (<3 hours/day)     Assistance Recommended at Discharge Frequent or constant Supervision/Assistance  Patient can return home with the following Two people to help with walking and/or transfers;Two people to help with bathing/dressing/bathroom;Assistance with cooking/housework;Direct supervision/assist for medications management;Assist for transportation;Help with stairs or ramp for entrance   Equipment Recommendations  Wheelchair (measurements PT);Wheelchair cushion (measurements PT);Other (comment) (lift equipment)    Recommendations for Other Services        Precautions / Restrictions Precautions Precautions: Fall Required Braces or Orthoses: Knee Immobilizer - Right Restrictions Weight Bearing Restrictions: Yes RLE Weight Bearing: Non weight bearing     Mobility  Bed Mobility Overal bed mobility: Needs Assistance Bed Mobility: Supine to Sit, Sit to Supine     Supine to sit: Mod assist, HOB elevated Sit to supine: Max assist   General bed mobility comments: significantly increased time for bed mobility, pt requiring max, multimodal cues for sequencing and to complete task, ultimately requiring modA for RLE management and trunk elevation, heavy use of bed rail; maxA for RLE management return to supine, significant increased time. pt able to pull self up in bed when cued to use bed rails, trendelenberg position    Transfers                   General transfer comment: attempted lateral scoots at EOB, pt positioned self in preparation to scoot with max cues, but ultimately not initiating scoot itself despite max, multimodal cues; pt then becoming nauseous requiring return to supine    Ambulation/Gait                   Stairs             Wheelchair Mobility    Modified Rankin (Stroke Patients Only)       Balance Overall balance assessment: Needs assistance Sitting-balance support: Feet unsupported, Bilateral upper extremity supported, No upper extremity supported Sitting balance-Leahy Scale: Fair                                      Cognition Arousal/Alertness: Awake/alert, Lethargic Behavior During Therapy: Flat affect Overall Cognitive Status: Impaired/Different from baseline  General Comments: difficult to determine true cognitive impairment as pt with minimal verbal responses. significant increased time to follow simple commands, inconsistent and at times staring blankly. pt attributes cognition/lethargy to medicine; husband  present, reports this is not baseline, also attributes to meds. RN reports pt has not received any significant meds since this AM        Exercises Other Exercises Other Exercises: pt tolerating bilateral ankle pumps, unable to tolerate attempts at R quad set or hip AAROM in isolation while supine    General Comments General comments (skin integrity, edema, etc.): pt's husband present, not forthcoming with information regarding pt's baseline cognition, initially stating this is baseline then later stating this is not "normal" and attributes cognition to medicine. pt and husband unaware sx was cancelled; RN reports MD in earlier today to talk to pt and family about this      Pertinent Vitals/Pain Pain Assessment Pain Assessment: Faces Faces Pain Scale: Hurts whole lot Pain Location: RLE Pain Descriptors / Indicators: Discomfort, Grimacing, Guarding, Throbbing Pain Intervention(s): Limited activity within patient's tolerance, Monitored during session    Home Living                          Prior Function            PT Goals (current goals can now be found in the care plan section) Progress towards PT goals: Progressing toward goals    Frequency    Min 3X/week      PT Plan Frequency needs to be updated    Co-evaluation              AM-PAC PT "6 Clicks" Mobility   Outcome Measure  Help needed turning from your back to your side while in a flat bed without using bedrails?: A Lot Help needed moving from lying on your back to sitting on the side of a flat bed without using bedrails?: Total Help needed moving to and from a bed to a chair (including a wheelchair)?: Total Help needed standing up from a chair using your arms (e.g., wheelchair or bedside chair)?: Total Help needed to walk in hospital room?: Total Help needed climbing 3-5 steps with a railing? : Total 6 Click Score: 7    End of Session   Activity Tolerance: Patient limited by pain;Patient  limited by fatigue;Patient limited by lethargy Patient left: in bed;with call bell/phone within reach;with bed alarm set;with family/visitor present Nurse Communication: Mobility status;Other (comment) (pt asking for "anti-itch" meds) PT Visit Diagnosis: Unsteadiness on feet (R26.81);History of falling (Z91.81);Difficulty in walking, not elsewhere classified (R26.2);Muscle weakness (generalized) (M62.81);Pain Pain - Right/Left: Right Pain - part of body: Knee     Time: 7588-3254 PT Time Calculation (min) (ACUTE ONLY): 35 min  Charges:  $Therapeutic Activity: 23-37 mins                     Mabeline Caras, PT, DPT Acute Rehabilitation Services  Pager [phone] Office [place] 08/23/2021, 5:08 PM

## 2021-09-07 ENCOUNTER — Inpatient Hospital Stay (HOSPITAL_COMMUNITY): Payer: Medicare PPO

## 2021-09-07 DIAGNOSIS — M25561 Pain in right knee: Secondary | ICD-10-CM | POA: Diagnosis not present

## 2021-09-07 LAB — CBC WITH DIFFERENTIAL/PLATELET
Abs Immature Granulocytes: 0.09 10*3/uL — ABNORMAL HIGH (ref 0.00–0.07)
Basophils Absolute: 0 10*3/uL (ref 0.0–0.1)
Basophils Relative: 0 %
Eosinophils Absolute: 0 10*3/uL (ref 0.0–0.5)
Eosinophils Relative: 0 %
HCT: 24.3 % — ABNORMAL LOW (ref 36.0–46.0)
Hemoglobin: 8.2 g/dL — ABNORMAL LOW (ref 12.0–15.0)
Immature Granulocytes: 1 %
Lymphocytes Relative: 2 %
Lymphs Abs: 0.4 10*3/uL — ABNORMAL LOW (ref 0.7–4.0)
MCH: 30.4 pg (ref 26.0–34.0)
MCHC: 33.7 g/dL (ref 30.0–36.0)
MCV: 90 fL (ref 80.0–100.0)
Monocytes Absolute: 1.9 10*3/uL — ABNORMAL HIGH (ref 0.1–1.0)
Monocytes Relative: 12 %
Neutro Abs: 13.3 10*3/uL — ABNORMAL HIGH (ref 1.7–7.7)
Neutrophils Relative %: 85 %
Platelets: 133 10*3/uL — ABNORMAL LOW (ref 150–400)
RBC: 2.7 MIL/uL — ABNORMAL LOW (ref 3.87–5.11)
RDW: 17.9 % — ABNORMAL HIGH (ref 11.5–15.5)
WBC: 15.7 10*3/uL — ABNORMAL HIGH (ref 4.0–10.5)
nRBC: 0.2 % (ref 0.0–0.2)

## 2021-09-07 LAB — BASIC METABOLIC PANEL
Anion gap: 15 (ref 5–15)
BUN: 55 mg/dL — ABNORMAL HIGH (ref 8–23)
CO2: 24 mmol/L (ref 22–32)
Calcium: 9.1 mg/dL (ref 8.9–10.3)
Chloride: 92 mmol/L — ABNORMAL LOW (ref 98–111)
Creatinine, Ser: 6.72 mg/dL — ABNORMAL HIGH (ref 0.44–1.00)
GFR, Estimated: 6 mL/min — ABNORMAL LOW (ref >60)
Glucose, Bld: 255 mg/dL — ABNORMAL HIGH (ref 70–99)
Potassium: 4.6 mmol/L (ref 3.5–5.1)
Sodium: 131 mmol/L — ABNORMAL LOW (ref 135–145)

## 2021-09-07 LAB — GLUCOSE, CAPILLARY
Glucose-Capillary: 258 mg/dL — ABNORMAL HIGH (ref 70–99)
Glucose-Capillary: 153 mg/dL — ABNORMAL HIGH (ref 70–99)
Glucose-Capillary: 189 mg/dL — ABNORMAL HIGH (ref 70–99)
Glucose-Capillary: 197 mg/dL — ABNORMAL HIGH (ref 70–99)

## 2021-09-07 MED ORDER — CYCLOBENZAPRINE HCL 5 MG PO TABS
5.0000 mg | ORAL_TABLET | Freq: Three times a day (TID) | ORAL | Status: DC
  Administered 2021-09-07: 5 mg via ORAL
  Filled 2021-09-07: qty 1

## 2021-09-07 MED ORDER — INSULIN GLARGINE-YFGN 100 UNIT/ML ~~LOC~~ SOLN
8.0000 [IU] | Freq: Every day | SUBCUTANEOUS | Status: DC
  Filled 2021-09-07: qty 0.08

## 2021-09-07 MED ORDER — DOCUSATE SODIUM 283 MG RE ENEM
1.0000 | ENEMA | Freq: Once | RECTAL | Status: AC
  Administered 2021-09-07: 283 mg via RECTAL
  Filled 2021-09-07: qty 1

## 2021-09-07 MED ORDER — DIPHENHYDRAMINE HCL 50 MG/ML IJ SOLN
25.0000 mg | Freq: Four times a day (QID) | INTRAMUSCULAR | Status: DC | PRN
  Administered 2021-09-08: 25 mg via INTRAVENOUS
  Filled 2021-09-07: qty 1

## 2021-09-07 MED ORDER — OXYCODONE HCL 5 MG PO TABS
5.0000 mg | ORAL_TABLET | Freq: Four times a day (QID) | ORAL | Status: AC
  Administered 2021-09-07: 5 mg via ORAL
  Filled 2021-09-07: qty 1

## 2021-09-07 MED ORDER — ONDANSETRON HCL 4 MG/2ML IJ SOLN
4.0000 mg | Freq: Four times a day (QID) | INTRAMUSCULAR | Status: DC | PRN
  Administered 2021-09-07: 4 mg via INTRAVENOUS
  Filled 2021-09-07: qty 2

## 2021-09-07 MED ORDER — BISACODYL 10 MG RE SUPP
10.0000 mg | Freq: Every day | RECTAL | Status: DC
  Administered 2021-09-07: 10 mg via RECTAL
  Filled 2021-09-07: qty 1

## 2021-09-07 NOTE — Progress Notes (Signed)
[facility]th[facility]edic [facility] Progress Note  Patient ID: Patricia Mata MRN: [id number] DOB/AGE: [birth year] 66 y.o.  Subjective:  Ortho issues stable  ROS As above  Objective:   VITALS:   Vitals:   09/07/21 1000 09/07/21 1030 09/07/21 1100 09/07/21 1209  BP: (!) 130/96 (!) 142/54 (!) 135/51 (!) 167/53  Pulse: 75 75 79 79  Resp: 20 20 (!) 21 (!) 21  Temp:    97.9 F (36.6 C)  TempSrc:    Oral  SpO2:  100% 100% 100%  Weight:      Height:        Estimated body mass index is 33.87 kg/m as calculated from the following:   Height as of this encounter: 5\' 4"  (1.626 m).   Weight as of this encounter: 89.5 kg.   Intake/Output      02/23 0701 02/24 0700 02/24 0701 02/25 0700   P.O. 700    Total Intake(mL/kg) 700 (7.8)    Urine (mL/kg/hr) 0 (0)    Emesis/NG output 500    Other  1000   Stool 0    Total Output 500 1000   Net +200 -1000          LABS  Results for orders placed or performed during the hospital encounter of 08/19/2021 (from the past 24 hour(s))  Glucose, capillary     Status: Abnormal   Collection Time: 08/17/2021  8:34 PM  Result Value Ref Range   Glucose-Capillary 255 (H) 70 - 99 mg/dL  CBC with Differential/Platelet     Status: Abnormal   Collection Time: 09/07/21  4:17 AM  Result Value Ref Range   WBC 15.7 (H) 4.0 - 10.5 K/uL   RBC 2.70 (L) 3.87 - 5.11 MIL/uL   Hemoglobin 8.2 (L) 12.0 - 15.0 g/dL   HCT 24.3 (L) 36.0 - 46.0 %   MCV 90.0 80.0 - 100.0 fL   MCH 30.4 26.0 - 34.0 pg   MCHC 33.7 30.0 - 36.0 g/dL   RDW 17.9 (H) 11.5 - 15.5 %   Platelets 133 (L) 150 - 400 K/uL   nRBC 0.2 0.0 - 0.2 %   Neutrophils Relative % 85 %   Neutro Abs 13.3 (H) 1.7 - 7.7 K/uL   Lymphocytes Relative 2 %   Lymphs Abs 0.4 (L) 0.7 - 4.0 K/uL   Monocytes Relative 12 %   Monocytes Absolute 1.9 (H) 0.1 - 1.0 K/uL   Eosinophils Relative 0 %   Eosinophils Absolute 0.0 0.0 - 0.5 K/uL    Basophils Relative 0 %   Basophils Absolute 0.0 0.0 - 0.1 K/uL   Immature Granulocytes 1 %   Abs Immature Granulocytes 0.09 (H) 0.00 - 0.07 K/uL  Basic metabolic panel     Status: Abnormal   Collection Time: 09/07/21  4:17 AM  Result Value Ref Range   Sodium 131 (L) 135 - 145 mmol/L   Potassium 4.6 3.5 - 5.1 mmol/L   Chloride 92 (L) 98 - 111 mmol/L   CO2 24 22 - 32 mmol/L   Glucose, Bld 255 (H) 70 - 99 mg/dL   BUN 55 (H) 8 - 23 mg/dL   Creatinine, Ser 6.72 (H) 0.44 - 1.00 mg/dL   Calcium 9.1 8.9 - 10.3 mg/dL   GFR, Estimated 6 (L) >60 mL/min   Anion  gap 15 5 - 15  Glucose, capillary     Status: Abnormal   Collection Time: 09/07/21  6:43 AM  Result Value Ref Range   Glucose-Capillary 258 (H) 70 - 99 mg/dL  Glucose, capillary     Status: Abnormal   Collection Time: 09/07/21 12:12 PM  Result Value Ref Range   Glucose-Capillary 153 (H) 70 - 99 mg/dL   *Note: Due to a large number of results and/or encounters for the requested time period, some results have not been displayed. A complete set of results can be found in Results Review.     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  66 y/o female with ESRD on HD with R distal femur fracture and L ankle pain    - Fall   - R distal femur fracture              non-op management as pt very high risk surgical  candidate  Continue with knee immobilizer and bulky compressive dressing for another 7-10 days then will begin gentle ROM   Knee immobilizer on at all times but skin checks q shift  Ice prn   NWB R leg    - L ankle pain and swelling              xrays negative  WBAT    - Dispo:            continue per primary team    Jari Pigg, PA-C [phone] (C) 09/07/2021, 4:47 PM  [facility] [place] [phone] Jenetta [phone] (F)    After 5pm and on the weekends please log on to Amion, go to orthopaedics and the look under the [facility] Call for the provider(s) on call. You can also call our office at [phone] and then follow the prompts to be connected to the call team.     Patient ID: Patricia Mata, female   DOB: 1955-12-17, 66 y.o.   MRN: [id number]

## 2021-09-07 NOTE — Progress Notes (Signed)
NG tube place, pt tolerated well, waiting on placement verify

## 2021-09-07 NOTE — Progress Notes (Signed)
Pt was able to ambulate to [facility], No relief from suppository nor mini-enema, c/o of RLQ pain and nausea. Doc notified

## 2021-09-07 NOTE — Progress Notes (Signed)
Inpatient Diabetes Program Recommendations  AACE/ADA: New Consensus Statement on Inpatient Glycemic Control (2015)  Target Ranges:  Prepandial:   less than 140 mg/dL      Peak postprandial:   less than 180 mg/dL (1-2 hours)      Critically ill patients:  140 - 180 mg/dL   Lab Results  Component Value Date   GLUCAP 258 (H) 09/07/2021   HGBA1C 5.9 (H) 06/14/2021    Review of Glycemic Control  Latest Reference Range & Units 08/20/2021 11:44 09/04/2021 16:19 09/04/2021 20:34 09/07/21 06:43  Glucose-Capillary 70 - 99 mg/dL 230 (H) 245 (H) 255 (H) 258 (H)  (H): Data is abnormally high Diabetes history: Type 2 DM Outpatient Diabetes medications: Basaglar 20 units PRN Current orders for Inpatient glycemic control: Semglee 5 units QD, Novolog 0-6 units TID, Tradjenta 5 mg QD  Inpatient Diabetes Program Recommendations:    Consider increasing Semglee to 8 units QD.   Thanks, Bronson Curb, MSN, RNC-OB Diabetes Coordinator [phone] (8a-5p)

## 2021-09-07 NOTE — Progress Notes (Signed)
PROGRESS NOTE  Patricia Mata  DOB: 12-Mar-1956  PCP: [name], [place] MPN:[id number]  DOA: 09/01/2021  LOS: 3 days  Hospital Day: 6  Brief narrative: Patricia Mata is a 66 y.o. female with PMH significant for ESRD HD MWF, DM 2, HTN, diastolic CHF, pulmonary hypertension, severe aortic stenosis, carotid artery disease, peripheral artery disease, PAF, hypothyroidism, hx of colon cancer s/p hemicolectomy and chemo with resultant MDS and transfusion dependent anemia. Patient presented to the ED on 08/19/2021 after a fall hurting her right knee and inability to walk.  X-ray right knee on admission showed degenerative change without acute abnormality.  But because of significant pain and inability to walk, patient was hospitalized. Next day on 2/20, MRI right knee was obtained which showed a nondisplaced fracture of the superior aspect of the anterolateral femoral condyle extending to the superior margin of the articular surface of the lateral trochlea and the lateral wall of the intercondylar notch.   Orthopedics was consulted.   Subjective: Patient was seen and examined this morning in dialysis unit. Pain improving.  Patient was able to sit up at the edge of the bed with PT yesterday. Events of last night noted.  Patient had feculent smelling vomiting.  No good bowel movement in several days.  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   -Initial x-ray did not show an acute abnormality but MRI right knee with nondisplaced fracture of the femoral condyle with intra-articular extension.   -Orthopedics and cardiology consult appreciated.  Because of significant cardiac risk with surgery, orthopedics recommended nonoperative management. -Patient is currently getting pain control with Flexeril 10 mg 3 times daily as needed, Dilaudid 1 to 2 mg IV every 3 as needed, oxycodone 5 mg every 6 hours as needed.   -I will switch her to scheduled Percocet 5 mg every 6 hours and scheduled Flexeril 10 mg 3 times daily to minimize use of IV Dilaudid and improve mobility.  Suspect bowel obstruction -Patient has been constipated for last few days.  She was complaining of abdominal pain yesterday.  She was given MiraLAX without success.  Last night she had feculent smelling vomiting per RN.  Obtain abdominal x-ray.  Give 1 dose of Dulcolax -May need NG decompression if vomiting continues.  May need surgical consultation.  ESRD HD MWF -Nephrology following.  Aortic stenosis, severe -Echo 07/2021 showed severe calcification and thickening of the aortic valve resulting severe aortic stenosis. -Followed by Dr. Burt Knack, severe but stable AS -Per his note in 07/2021: comorbid medical conditions remain prohibitive for consideration of TAVR.   Chronic diastolic CHF (congestive heart failure)  -Appears euvolemic. 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 Alen Blew. -Current therapy:  Reblozyl 75 mg subcutaneous injection every 3 weeks started in December 2021.  -Red cell transfusion to keep hemoglobin above 8.  Monitor PRBC was given on  2/21. -For last 3 days, hemoglobin has been stable between 8 and 9. Recent Labs    10/09/20 1656 10/10/20 0939 12/26/20 1515 01/03/21 0849 05/13/21 2314 05/14/21 0223 05/15/21 0449 09/04/21 0323 09/04/21 1333 09/05/21 0446 08/17/2021 0208 09/07/21 0417  HGB  --    < >  --    < >  --  7.1*   < > 7.9* 9.5* 8.6* 8.6* 8.2*  MCV  --    < >  --    < >  --  88.1   < > 89.2  --  90.6 92.3 90.0  VITAMINB12  --   --   --   --   --  1,155*  --   --   --   --   --   --   FOLATE  --   --   --   --   --  15.7  --   --   --   --   --   --   FERRITIN  --   --   --   --   --  2,121*  --   --   --   --   --   --   TIBC  --   --   --   --   --  209*  --   --   --   --   --   --   IRON  --   --   --   --   --  204*  --   --   --   --   --   --   RETICCTPCT 1.8  --  1.4  --  2.2 2.7  --   --   --   --   --   --    < > = values in this interval not displayed.   Type 2 diabetes mellitus -A1c 5.9 on 06/2021 -Home meds include Tradjenta -Currently on Semglee 5 units daily and sliding scale insulin.  Blood sugar level consistently over 200.  Will increase Semglee to 8 units from tomorrow. Recent Labs  Lab 08/23/2021 1144 08/21/2021 1619 09/07/2021 2034 09/07/21 0643 09/07/21 1212  GLUCAP 230* 245* 255* 258* 153*   Hyperlipidemia  -Continue lipitor    Gout  Continue allopurinol    Hypothyroidism -TSH wnl 07/2021 -Continue home synthroid at 184mcg   Obesity -Body mass index is 33.87 kg/m. Patient has been advised to make an attempt to improve diet and exercise patterns to aid in weight loss.  Mobility: PT eval postprocedure required Goals of care   Code Status: Full Code    Nutritional status:  Body mass index is 33.87 kg/m.      Diet:  Diet Order             Diet renal/carb modified with fluid restriction Diet-HS Snack? Nothing; Fluid restriction: 1200 mL Fluid; Room service appropriate? Yes; Fluid consistency: Thin  Diet effective now                   DVT prophylaxis:  SCDs  Start: 09/03/21 1341   Antimicrobials: None Fluid: None Consultants: Nephrology, orthopedics, cardiology Family Communication: None at bedside  Status is: Inpatient  Continue in-hospital care because: Nonoperative management of fracture,  PT eval, pain control Level of care: Telemetry Medical   Dispo: The patient is from: Home              Anticipated d/c is to: SNF ultimately after adequate pain control              Patient currently is not medically stable to d/c.   Difficult to place patient No     Infusions:   sodium chloride      Scheduled Meds:  (feeding supplement) PROSource Plus  30 mL Oral BID BM   sodium chloride   Intravenous Once   allopurinol  300 mg Oral q morning   amiodarone  200 mg Oral Daily   atorvastatin  40 mg Oral Daily   bisacodyl  10 mg Rectal Daily   carvedilol  6.25 mg Oral BID WC   Chlorhexidine Gluconate Cloth  6 each Topical Daily   cyclobenzaprine  5 mg Oral TID   docusate sodium  1 enema Rectal Once   insulin aspart  0-6 Units Subcutaneous TID WC   [START ON 09/14/21] insulin glargine-yfgn  8 Units Subcutaneous Daily   latanoprost  1 drop Both Eyes QHS   levothyroxine  175 mcg Oral QAC breakfast   linagliptin  5 mg Oral Daily   midodrine  10 mg Oral Q M,W,F-HD   oxyCODONE  5 mg Oral Q6H   pantoprazole  40 mg Oral Daily   senna-docusate  1 tablet Oral QHS   sevelamer carbonate  800 mg Oral TID WC   sildenafil  20 mg Oral TID   sodium chloride flush  3 mL Intravenous Q12H    PRN meds: sodium chloride, acetaminophen, diphenhydrAMINE, docusate sodium, HYDROmorphone (DILAUDID) injection, lip balm, polyethylene glycol, sodium chloride flush, traZODone   Antimicrobials: Anti-infectives (From admission, onward)    None       Objective: Vitals:   09/07/21 1100 09/07/21 1209  BP: (!) 135/51 (!) 167/53  Pulse: 79 79  Resp: (!) 21 (!) 21  Temp:  97.9 F (36.6 C)  SpO2: 100% 100%    Intake/Output Summary (Last 24 hours) at  09/07/2021 1329 Last data filed at 08/31/2021 2036 Gross per 24 hour  Intake 220 ml  Output 500 ml  Net -280 ml   Filed Weights   09/05/21 0735 09/05/21 1120 09/07/21 0753  Weight: 90.3 kg 89.5 kg 89.5 kg   Weight change:  Body mass index is 33.87 kg/m.   Physical Exam: General exam: Pleasant, elderly African-American female. Skin: No rashes, lesions or ulcers. HEENT: Atraumatic, normocephalic, no obvious bleeding Lungs: Clear to auscultation bilaterally CVS: Regular rate and rhythm, no murmur GI/Abd soft, mild diffuse tenderness, distended, bowel sound present CNS: Alert, awake, oriented x3 Psychiatry: Sad affect because of episodes of acute episodic pain Extremities: No pedal edema, no calf tenderness.  Right knee tender to touch.  Data Review: I have personally reviewed the laboratory data and studies available.  F/u labs ordered Unresulted Labs (From admission, onward)     Start     Ordered   08/28/2021 0500  CBC with Differential/Platelet  Daily,   R     Question:  Specimen collection method  Answer:  Lab=Lab collect   09/05/21 1058   08/20/2021 1610  Basic metabolic panel  Daily,   R     Question:  Specimen collection method  Answer:  Lab=Lab collect   09/05/21 1058   Signed and Held  Renal function panel  Once,   R  Question:  Specimen collection method  Answer:  Lab=Lab collect   Signed and Held   Signed and Held  CBC  Once,   R       Question:  Specimen collection method  Answer:  Lab=Lab collect   Signed and Held            Signed, Terrilee Croak, MD [facility] 09/07/2021

## 2021-09-07 NOTE — Progress Notes (Signed)
[facility] Progress Note   Subjective:  Seen on HD, feeling constipated, no BM 5 days, asking for mini-enema like she had on prior admission.   Objective Vitals:   09/07/21 1000 09/07/21 1030 09/07/21 1100 09/07/21 1209  BP: (!) 130/96 (!) 142/54 (!) 135/51 (!) 167/53  Pulse: 75 75 79 79  Resp: 20 20 (!) 21 (!) 21  Temp:    97.9 F (36.6 C)  TempSrc:    Oral  SpO2:  100% 100% 100%  Weight:      Height:       Physical Exam General: Well appearing woman, NAD. Room air. Heart: RRR; no murmur Lungs: CTA anteriorly Abdomen: soft Extremities: No LE edema; R leg in large soft brace Dialysis Access: RUE AVF + bruit   OP HD: MWF at [facility]  3h 87min  400/1.5   88kg  2/2 bath  AVF  Hep 2000 - Calcitriol 0.75mcg PO q HD - Anemia managed by oncology, transfusion dependent myelodysplastic syndrome. Failed high dose ESA. Not iron deficient, last tsat 93%.   Assessment/Plan:  L ankle strain/R knee nondisplaced fracture/meniscal tears: No fracture on xray, but present on MRI + CT. In ankle brace and knee immobilizer, but unable to ambulate and pain uncontrolled. Being eval for SNF placement.  ESRD:  Usual MWF schedule. HD today.   Hypertension/volume: BP soft (on pain meds), UF as tolerated. Looks euvolemic.  Anemia/MDS: Hgb 8.6 today, s/p 1U PRBCs 2/21. Known transfusion-dependent MDS. Typically gets prbc transfusion q 2 weeks to keep Hgb > 8.   Metabolic bone disease: CorrCa slightly high, Phos high - tells me does not take binder at home, started Renvela 1/meals on 2/22. Holding home VDRA for now. Nutrition: Alb low, added protein supplements. T2DM pA-Fib Severe AS Pulm HTN  Kelly Splinter, MD 09/07/2021, 3:22 PM        Additional Objective Labs: Basic Metabolic Panel: Recent Labs  Lab 09/05/21 0446 08/31/2021 0208 09/07/21 0417  NA 131* 134* 131*  K 4.0 4.1 4.6  CL 91* 94* 92*  CO2 25 22 24   GLUCOSE 116* 152* 255*  BUN 42* 31* 55*  CREATININE 5.74* 4.56*  6.72*  CALCIUM 9.2 8.8* 9.1  PHOS 6.1*  --   --     Liver Function Tests: Recent Labs  Lab 09/03/21 0735 09/05/21 0446  AST 55*  --   ALT 38  --   ALKPHOS 121  --   BILITOT 1.0  --   PROT 7.1  --   ALBUMIN 3.5 3.1*    CBC: Recent Labs  Lab 09/03/21 0735 09/03/21 2130 09/04/21 0323 09/04/21 1333 09/05/21 0446 09/10/2021 0208 09/07/21 0417  WBC 9.2 9.5 7.2  --  7.8 18.4* 15.7*  NEUTROABS 7.3  --   --   --   --  16.2* 13.3*  HGB 8.3* 8.5* 7.9*   < > 8.6* 8.6* 8.2*  HCT 24.1* 24.1* 23.9*   < > 25.1* 26.2* 24.3*  MCV 87.3 87.6 89.2  --  90.6 92.3 90.0  PLT 143* 140* 130*  --  121* 129* 133*   < > = values in this interval not displayed.    Studies/Results: US Abdomen Complete  Result Date: 09/07/2021 CLINICAL DATA:  Abdominal pain EXAM: ABDOMEN ULTRASOUND COMPLETE COMPARISON:  None. FINDINGS: Gallbladder: No gallstones or wall thickening visualized. No sonographic Murphy sign noted by sonographer. Common bile duct: Diameter: 8 mm in proximal diameter Liver: No focal lesion identified. Within normal limits in parenchymal echogenicity.  Portal vein is patent on color Doppler imaging with normal direction of blood flow towards the liver. IVC: No abnormality visualized. Pancreas: Visualized portion unremarkable. Spleen: Size and appearance within normal limits. Right Kidney: Length: 7.9 cm. Right renal cortical thickness is preserved. Echogenicity within normal limits. No mass or hydronephrosis visualized. Left Kidney: Length: 10.9 cm. Echogenicity within normal limits. No mass or hydronephrosis visualized. Abdominal aorta: No aneurysm visualized. Other findings: None. IMPRESSION: Mild extrahepatic biliary ductal dilation. A distal obstructing lesion is not excluded. Correlation with liver enzymes is recommended. If indicated, ERCP or MRCP examination may be more helpful to assess the distal duct. Size discrepancy of the kidneys, stable since MRI examination of 01/01/2021. Electronically  Signed   By: Fidela Salisbury M.D.   On: 09/07/2021 01:24    Medications:  sodium chloride      (feeding supplement) PROSource Plus  30 mL Oral BID BM   sodium chloride   Intravenous Once   allopurinol  300 mg Oral q morning   amiodarone  200 mg Oral Daily   atorvastatin  40 mg Oral Daily   bisacodyl  10 mg Rectal Daily   carvedilol  6.25 mg Oral BID WC   Chlorhexidine Gluconate Cloth  6 each Topical Daily   cyclobenzaprine  5 mg Oral TID   insulin aspart  0-6 Units Subcutaneous TID WC   [START ON Sep 29, 2021] insulin glargine-yfgn  8 Units Subcutaneous Daily   latanoprost  1 drop Both Eyes QHS   levothyroxine  175 mcg Oral QAC breakfast   linagliptin  5 mg Oral Daily   midodrine  10 mg Oral Q M,W,F-HD   oxyCODONE  5 mg Oral Q6H   pantoprazole  40 mg Oral Daily   senna-docusate  1 tablet Oral QHS   sevelamer carbonate  800 mg Oral TID WC   sildenafil  20 mg Oral TID   sodium chloride flush  3 mL Intravenous Q12H

## 2021-09-07 NOTE — TOC Progression Note (Addendum)
Transition of Care [facility]) - Progression Note    Patient Details  Name: Patricia Mata MRN: [id number] Date of Birth: 1956-06-05  Transition of Care [facility]) CM/SW Contact  Joanne Chars, LCSW Phone Number: 09/07/2021, 2:38 PM  Clinical Narrative:  Pt  placement arranged at [place], including HD.  Pt not stable for DC at this time.  TOC will continue to follow.   Pt [employer] policy is managed by [facility] for SNF auth.   Expected Discharge Plan: [place] Barriers to Discharge: Continued Medical Work up  Expected Discharge Plan and Services Expected Discharge Plan: [place] arrangements for the past 2 months: Single Family Home                                       Social Determinants of Health (SDOH) Interventions    Readmission Risk Interventions Readmission Risk Prevention Plan 10/13/2020 10/12/2020  Medication Review (RN Care Manager) Complete Complete  Some recent data might be hidden

## 2021-09-08 ENCOUNTER — Inpatient Hospital Stay (HOSPITAL_COMMUNITY): Payer: Medicare PPO | Admitting: Certified Registered"

## 2021-09-08 DIAGNOSIS — I132 Hypertensive heart and chronic kidney disease with heart failure and with stage 5 chronic kidney disease, or end stage renal disease: Secondary | ICD-10-CM

## 2021-09-08 DIAGNOSIS — E1122 Type 2 diabetes mellitus with diabetic chronic kidney disease: Secondary | ICD-10-CM

## 2021-09-08 DIAGNOSIS — N179 Acute kidney failure, unspecified: Secondary | ICD-10-CM

## 2021-09-08 DIAGNOSIS — I5033 Acute on chronic diastolic (congestive) heart failure: Secondary | ICD-10-CM

## 2021-09-08 LAB — CBC WITH DIFFERENTIAL/PLATELET
Abs Immature Granulocytes: 0.09 10*3/uL — ABNORMAL HIGH (ref 0.00–0.07)
Basophils Absolute: 0 10*3/uL (ref 0.0–0.1)
Basophils Relative: 0 %
Eosinophils Absolute: 0.2 10*3/uL (ref 0.0–0.5)
Eosinophils Relative: 1 %
HCT: 24.7 % — ABNORMAL LOW (ref 36.0–46.0)
Hemoglobin: 8.2 g/dL — ABNORMAL LOW (ref 12.0–15.0)
Immature Granulocytes: 1 %
Lymphocytes Relative: 3 %
Lymphs Abs: 0.3 10*3/uL — ABNORMAL LOW (ref 0.7–4.0)
MCH: 30 pg (ref 26.0–34.0)
MCHC: 33.2 g/dL (ref 30.0–36.0)
MCV: 90.5 fL (ref 80.0–100.0)
Monocytes Absolute: 2.3 10*3/uL — ABNORMAL HIGH (ref 0.1–1.0)
Monocytes Relative: 18 %
Neutro Abs: 9.8 10*3/uL — ABNORMAL HIGH (ref 1.7–7.7)
Neutrophils Relative %: 77 %
Platelets: 173 10*3/uL (ref 150–400)
RBC: 2.73 MIL/uL — ABNORMAL LOW (ref 3.87–5.11)
RDW: 18 % — ABNORMAL HIGH (ref 11.5–15.5)
WBC: 12.7 10*3/uL — ABNORMAL HIGH (ref 4.0–10.5)
nRBC: 1.7 % — ABNORMAL HIGH (ref 0.0–0.2)

## 2021-09-08 LAB — BASIC METABOLIC PANEL
Anion gap: 16 — ABNORMAL HIGH (ref 5–15)
BUN: 35 mg/dL — ABNORMAL HIGH (ref 8–23)
CO2: 23 mmol/L (ref 22–32)
Calcium: 9.4 mg/dL (ref 8.9–10.3)
Chloride: 94 mmol/L — ABNORMAL LOW (ref 98–111)
Creatinine, Ser: 4.46 mg/dL — ABNORMAL HIGH (ref 0.44–1.00)
GFR, Estimated: 10 mL/min — ABNORMAL LOW (ref >60)
Glucose, Bld: 267 mg/dL — ABNORMAL HIGH (ref 70–99)
Potassium: 4.2 mmol/L (ref 3.5–5.1)
Sodium: 133 mmol/L — ABNORMAL LOW (ref 135–145)

## 2021-09-08 LAB — GLUCOSE, CAPILLARY
Glucose-Capillary: 265 mg/dL — ABNORMAL HIGH (ref 70–99)
Glucose-Capillary: 165 mg/dL — ABNORMAL HIGH (ref 70–99)

## 2021-09-08 MED ORDER — EPINEPHRINE 1 MG/10ML IJ SOSY
PREFILLED_SYRINGE | INTRAMUSCULAR | Status: AC
  Filled 2021-09-08: qty 20

## 2021-09-08 MED ORDER — SODIUM BICARBONATE 8.4 % IV SOLN
INTRAVENOUS | Status: AC
  Filled 2021-09-08: qty 50

## 2021-09-08 MED ORDER — SODIUM CHLORIDE 0.9 % IV BOLUS: 500.0000 mL | Freq: Once | INTRAVENOUS | Status: DC

## 2021-09-08 MED ORDER — SODIUM CHLORIDE 0.9 % IV BOLUS
250.0000 mL | Freq: Once | INTRAVENOUS | Status: AC
  Administered 2021-09-08: 250 mL via INTRAVENOUS

## 2021-09-12 NOTE — Progress Notes (Signed)
Responded to code blue call immediately.  Upon entering room, noted a RN was already performing chest compressions, and noted pt had brown watery substance coming from mouth, brown liquid pooled in bed around head and chest area,  per report from a RN in the room the pt had already aspirated.  Also at this time, crash cart was brought in room.  I requested oral suction to be set up, and I began to BMV pt on 100% fio2 without difficulty.   Once RN set up suction, I began to suction airway.  Throughout entire code, pt was bagged on 100% fio2 for the entire code by myself or charge RT.   While setting up for intubation, Anesthesia arrived to the code and anesthesia intubated pt (see anesthesia note).  + easy cap CO2 detector noted, Breath sounds confirmed by anesthesia/RN and pt continued to be ambu bagged on 100% fio2 without difficulty, until code was called to stop by MD and per family at bedside.   S/p code, noted an ABG order that was placed in Epic while code was in progress however ABG was not requested during code and RTs in the room were providing airway (bagging,  suctioning, and assisting in airway establishment) support to pt.

## 2021-09-12 NOTE — Progress Notes (Signed)
Around 3 am pt asked to use the [facility]. Pt got up with 2 assist. Shortly after she fainted; HR went up to 130. Pt is back to bed. VS  temp 98.4,  HR 96, BP 104/51, RR 20, O2 100% on RA. CBG 265 RRT by the bedside. Pt is able to respond to questions, seems a little confused; able to follow simple commands. [facility] is notified. Per NP to give 250 cc bolus of normal saline.

## 2021-09-12 NOTE — Significant Event (Signed)
Rapid Response Event Note   Reason for Call : Probable Vagal episode while up to [facility]  Initial Focused Assessment:  [facility] Rapid Response call initiated. Reportedly pt was up to [facility] and became briefly unresponsive. Pt assisted back to bed. Upon my arrival, Pt was lying in the bed with her eyes open. Initially, she was slow to respond to commands and answer questions.  After a few minutes, Ms. Randleman was able to answer questions and follow commands. After mouthcare performed, no focal deficits. Telemetry personally reviewed. There was a brief episode of tachycardia HR 150 but spontaneously resolved. CBG checked and was in the 200s.   0302-HR 96 SR, 104/51 (69), RR 20 with sats 100% on RA.   Interventions:  -No interventions per RRRN    MD Notified:  Per nursing staff Call Time: 0258 Arrival Time: 0302 End Time: 0314  Madelynn Done, RN

## 2021-09-12 NOTE — Anesthesia Procedure Notes (Signed)
Procedure Name: Intubation Date/Time: Sep 21, 2021 8:45 AM Performed by: Georgia Duff, CRNA Pre-anesthesia Checklist: Patient identified, Emergency Drugs available, Suction available and Patient being monitored Patient Re-evaluated:Patient Re-evaluated prior to induction Oxygen Delivery Method: Circle System Utilized Preoxygenation: Pre-oxygenation with 100% oxygen Induction Type: IV induction Ventilation: Mask ventilation without difficulty Laryngoscope Size: Glidescope and 4 Grade View: Grade I Tube type: Oral Tube size: 8.0 mm Number of attempts: 1 Airway Equipment and Method: Stylet and Oral airway Placement Confirmation: ETT inserted through vocal cords under direct vision, positive ETCO2 and breath sounds checked- equal and bilateral Secured at: 21 cm Tube secured with: Tape Dental Injury: Teeth and Oropharynx as per pre-operative assessment

## 2021-09-12 NOTE — Death Summary Note (Signed)
DEATH SUMMARY   Patient Details  Name: Patricia Mata MRN: [id number] DOB: Feb 07, 1956 WUJ:[id number], [name], FNP  Admission/Discharge Information   Admit Date:  09/22/2021  Date of Death: Date of Death: 09/28/2021  Time of Death: Time of Death: 0852  Length of Stay: 4   Principle Cause of death: Likely sudden cardiac death in the setting of severe aortic stenosis, hypotension and dialysis status.  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 female with PMH significant for ESRD HD MWF, DM 2, HTN, diastolic CHF, pulmonary hypertension, severe aortic stenosis, carotid artery disease, peripheral artery disease, PAF, hypothyroidism, hx of colon cancer s/p hemicolectomy and chemo with resultant MDS and transfusion dependent anemia. Patient presented to the ED on Sep 22, 2021 after a fall hurting her right knee and inability to walk.  X-ray right knee on admission showed degenerative change without acute abnormality.  But because of significant pain and inability to walk, patient was hospitalized. Next day on 2/20, MRI right knee was obtained which showed a nondisplaced fracture of the superior aspect of the anterolateral femoral condyle extending to the superior margin of the articular surface of the lateral trochlea and the lateral wall of the intercondylar notch.   Orthopedics was consulted.   See detailed hospital course as below.  On the morning of September 28, 2022, patient went hypotensive and suddenly went unresponsive.   CODE BLUE was called.  ACLS protocol started.  She was PEA.  Patient vomited profusely during the code.  She was coded for about 12 minutes.  She was intubated in the process she got 1 dose of epinephrine and sodium bicarbonate.  Return of circulation could not be achieved. She was pronounced at 8:52 AM  Hospital course  Right femur condylar fracture -Presented with a fall resulting in sudden right knee pain, inability to bear weight.   -Initial x-ray did not show an acute abnormality but MRI right knee with nondisplaced fracture of the femoral condyle with intra-articular extension.   -Orthopedics and cardiology consultations were obtained.  Because of significant cardiac risk with surgery, orthopedics recommended nonoperative management. -Patient was seen by PT.  She was placed on multiple pain medicines.   Suspect bowel obstruction -Patient was constipated since admission, probably because of pain medicines.  She was complaining of abdominal pain yesterday.  She was given MiraLAX without success.  On 2/24, patient started having f abdominal x-ray showed possible bowel obstruction versus ileus.  NG tube was placed for suction decompression.   ESRD HD MWF -Nephrology was consulted   Aortic stenosis, severe Chronic diastolic CHF -Echo 10/7827 showed severe calcification and thickening of the aortic valve resulting severe aortic stenosis. -Followed by Dr. Burt Knack, severe but stable AS -Per his note in 07/2021: comorbid medical conditions remain prohibitive for consideration of TAVR.    Pulmonary hypertension -Patient was on revatio    Paroxysmal atrial fibrillation  -Patient was not amiodarone and coreg -She was not anticoagulation due to transfusion dependent anemia   MDS (myelodysplastic syndrome) with transfusion  depenent anemia -   -She was followed by oncology, Dr. Alen Blew.  Type 2 diabetes mellitus -A1c 5.9 on 06/2021  Hyperlipidemia  -She was on lipitor    Gout  She was on  allopurinol    Hypothyroidism -TSH wnl 07/2021 -She was on synthroid at 131mcg    Obesity -Body mass index is 33.87 kg/m.  The results of significant diagnostics from this hospitalization (including imaging, microbiology, ancillary and laboratory) are listed below for reference.   Significant Diagnostic Studies: DG Knee 2 Views Right  Result Date: 08/21/2021 CLINICAL DATA:  Recent fall with right knee pain, initial encounter EXAM: RIGHT KNEE - 1-2 VIEW COMPARISON:  None. FINDINGS: Mild medial joint space narrowing is noted. No acute fracture or dislocation is noted. Vascular calcifications are noted as well as soft tissue calcifications. IMPRESSION: Degenerative change without acute abnormality. Electronically Signed   By: Inez Catalina M.D.   On: 08/25/2021 23:41   DG Ankle 2 Views Left  Result Date: 08/16/2021 CLINICAL DATA:  Recent fall with ankle pain, initial encounter EXAM: LEFT ANKLE - 2 VIEW COMPARISON:  None. FINDINGS: Generalized soft tissue swelling is noted about the ankle. Calcaneal spurring is seen. Tarsal degenerative changes are noted. No acute fracture or dislocation is noted. Vascular calcifications are noted. IMPRESSION: Soft tissue swelling without acute bony abnormality. Electronically Signed   By: Inez Catalina M.D.   On: 09/11/2021 23:44   DG Ankle Complete Left  Result Date: 09/05/2021 CLINICAL DATA:  Fall.  Left ankle pain and swelling. EXAM: LEFT ANKLE COMPLETE - 3+ VIEW COMPARISON:  08/21/2021 FINDINGS: There is no evidence of fracture, dislocation, or joint effusion. Diffuse soft tissue swelling is seen, however there is no evidence of soft tissue gas or radiopaque foreign body. Generalized osteopenia is noted. Extensive peripheral vascular calcification is seen. Prominent plantar calcaneal bone spur also noted. IMPRESSION: Diffuse soft tissue swelling. No evidence of fracture or dislocation. Extensive peripheral vascular calcification. Prominent plantar calcaneal bone  spur. Osteopenia. Electronically Signed   By: Marlaine Hind M.D.   On: 09/05/2021 12:58   DG Abd 1 View  Result Date: 09/07/2021 CLINICAL DATA:  Abdominal pain EXAM: ABDOMEN - 1 VIEW COMPARISON:  05/24/2021 FINDINGS: There is abnormal dilation of small-bowel loops measuring up to 4 cm in maximum diameter. Gas and stool are noted in colon. Stomach is unremarkable. Visualized lower lung fields are clear. IMPRESSION: There is dilation of multiple small bowel loops suggesting partial small bowel obstruction. Less likely possibility would be ileus. Electronically Signed   By: Elmer Picker M.D.   On: 09/07/2021 17:22   CT KNEE RIGHT WO CONTRAST  Result Date: 09/04/2021 CLINICAL DATA:  Knee trauma, occult fracture. EXAM: CT OF THE RIGHT KNEE WITHOUT CONTRAST TECHNIQUE: Multidetector CT imaging of the right knee was performed according to the standard protocol. Multiplanar CT image reconstructions were also generated. RADIATION DOSE REDUCTION: This exam was performed according to the departmental dose-optimization program which includes automated exposure control, adjustment of the mA and/or kV according to patient size and/or use of iterative reconstruction technique. COMPARISON:  MRI 09/03/2021 FINDINGS: Bones/Joint/Cartilage Generalized osteopenia. Mildly comminuted fracture of the anterior lateral femoral condyle with a fracture cleft extending to the articular surface of the lateral trochlea. Oblique fracture cleft extends from the lateral supracondylar distal femoral metaphysis to the distal diaphysis. Normal alignment. Moderate lipohemarthrosis. No other fracture or dislocation. No aggressive osseous lesion. Ligaments Ligaments are suboptimally evaluated by CT. Muscles and Tendons Atrophy of the medial and lateral  gastrocnemius muscles. No intramuscular fluid collection or hematoma. Soft tissue No fluid collection or hematoma. No soft tissue mass. Peripheral vascular atherosclerotic disease. IMPRESSION:  1. Mildly comminuted fracture of the lateral femoral condyle with a fracture cleft extending to the articular surface of the anterior lateral trochlea. Oblique fracture cleft extends from the lateral supracondylar distal femoral metaphysis to the distal diaphysis. Electronically Signed   By: Kathreen Devoid M.D.   On: 09/04/2021 14:12   US Abdomen Complete  Result Date: 09/07/2021 CLINICAL DATA:  Abdominal pain EXAM: ABDOMEN ULTRASOUND COMPLETE COMPARISON:  None. FINDINGS: Gallbladder: No gallstones or wall thickening visualized. No sonographic Murphy sign noted by sonographer. Common bile duct: Diameter: 8 mm in proximal diameter Liver: No focal lesion identified. Within normal limits in parenchymal echogenicity. Portal vein is patent on color Doppler imaging with normal direction of blood flow towards the liver. IVC: No abnormality visualized. Pancreas: Visualized portion unremarkable. Spleen: Size and appearance within normal limits. Right Kidney: Length: 7.9 cm. Right renal cortical thickness is preserved. Echogenicity within normal limits. No mass or hydronephrosis visualized. Left Kidney: Length: 10.9 cm. Echogenicity within normal limits. No mass or hydronephrosis visualized. Abdominal aorta: No aneurysm visualized. Other findings: None. IMPRESSION: Mild extrahepatic biliary ductal dilation. A distal obstructing lesion is not excluded. Correlation with liver enzymes is recommended. If indicated, ERCP or MRCP examination may be more helpful to assess the distal duct. Size discrepancy of the kidneys, stable since MRI examination of 01/01/2021. Electronically Signed   By: Fidela Salisbury M.D.   On: 09/07/2021 01:24   MR KNEE RIGHT WO CONTRAST  Result Date: 09/04/2021 CLINICAL DATA:  History of knee trauma. Status post fall at home. Right knee pain. EXAM: MRI OF THE RIGHT KNEE WITHOUT CONTRAST TECHNIQUE: Multiplanar, multisequence MR imaging of the knee was performed. No intravenous contrast was administered.  COMPARISON:  09/03/2021 FINDINGS: MENISCI Medial: Relative attenuation of the body of the medial meniscus with a small oblique tear of the posterior body junction of the medial meniscus (image 2/series 21). Lateral: Possible small tiny radial tear of the free edge of the body of the lateral meniscus seen on a single image (image 18/series 20). LIGAMENTS Cruciates: ACL and PCL are intact. Collaterals: Medial collateral ligament is intact. Lateral collateral ligament complex is intact. CARTILAGE Patellofemoral: Partial-thickness cartilage loss of the lateral patellofemoral compartment. Medial: Mild partial-thickness cartilage loss of the medial femorotibial compartment. Lateral: Mild partial-thickness cartilage of the lateral femorotibial compartment. JOINT: Large lipohemarthrosis. Normal Hoffa's fat-pad. No plical thickening. POPLITEAL FOSSA: Popliteus tendon is intact. Small Baker's cyst. EXTENSOR MECHANISM: Intact quadriceps tendon. Intact patellar tendon. Intact lateral patellar retinaculum. Intact medial patellar retinaculum. Intact MPFL. BONES: No aggressive osseous lesion. Nondisplaced fracture of the superior aspect of the anterolateral femoral condyle extending to the superior margin of the articular surface of the lateral trochlea and the lateral wall of the intercondylar notch. Osseous contusion of the lateral aspect of the patella. Other: No fluid collection or hematoma. Muscles are normal. IMPRESSION: 1. Nondisplaced fracture of the superior aspect of the anterolateral femoral condyle extending to the superior margin of the articular surface of the lateral trochlea and the lateral wall of the intercondylar notch. 2. Osseous contusion of the lateral aspect of the patella. 3. Large lipohemarthrosis. 4. Relative attenuation of the body of the medial meniscus with a small oblique tear of the posterior body junction of the medial meniscus. 5. Possible small tiny radial tear of the free edge of the body of the  lateral  meniscus seen on a single image. 6. Mild tricompartmental cartilage abnormalities as described above. Electronically Signed   By: Kathreen Devoid M.D.   On: 09/04/2021 06:04   DG Chest Port 1 View  Result Date: 09/07/2021 CLINICAL DATA:  NG tube placement. EXAM: PORTABLE CHEST 1 VIEW COMPARISON:  September 07, 2021 (7:32 p.m.) FINDINGS: Interval nasogastric tube placement is seen with its distal tip and distal side hole seen within the body of the stomach. The heart size and mediastinal contours are within normal limits. There is moderate severity calcification of the aortic arch. Both lungs are clear. Air-filled small and large bowel loops are seen within the abdomen. The visualized skeletal structures are unremarkable. IMPRESSION: Nasogastric tube positioning, as described above. Electronically Signed   By: Virgina Norfolk M.D.   On: 09/07/2021 21:32   DG Chest Port 1 View  Result Date: 09/07/2021 CLINICAL DATA:  NG tube placement EXAM: PORTABLE CHEST 1 VIEW COMPARISON:  Previous studies including the examination of 05/08/2021 FINDINGS: Nasogastric tube appears to be coiled in the region of the hypopharynx. There is no demonstrable enteric tube in the course of thoracic esophagus or stomach. Cardiac size is within normal limits. Lung fields are clear of any infiltrates or pulmonary edema. There is no pleural effusion or pneumothorax. IMPRESSION: Enteric tube appears to be coiled within the hypopharynx. Enteric tube should be repositioned. Electronically Signed   By: Elmer Picker M.D.   On: 09/07/2021 20:02    Microbiology: Recent Results (from the past 240 hour(s))  Resp Panel by RT-PCR (Flu A&B, Covid) Nasopharyngeal Swab     Status: None   Collection Time: 09/03/21  7:25 AM   Specimen: Nasopharyngeal Swab; Nasopharyngeal(NP) swabs in vial transport medium  Result Value Ref Range Status   SARS Coronavirus 2 by RT PCR NEGATIVE NEGATIVE Final    Comment: (NOTE) SARS-CoV-2 target  nucleic acids are NOT DETECTED.  The SARS-CoV-2 RNA is generally detectable in upper respiratory specimens during the acute phase of infection. The lowest concentration of SARS-CoV-2 viral copies this assay can detect is 138 copies/mL. A negative result does not preclude SARS-Cov-2 infection and should not be used as the sole basis for treatment or other patient management decisions. A negative result may occur with  improper specimen collection/handling, submission of specimen other than nasopharyngeal swab, presence of viral mutation(s) within the areas targeted by this assay, and inadequate number of viral copies(<138 copies/mL). A negative result must be combined with clinical observations, patient history, and epidemiological information. The expected result is Negative.  Fact Sheet for Patients:  EntrepreneurPulse.com.au  Fact Sheet for Healthcare Providers:  IncredibleEmployment.be  This test is no t yet approved or cleared by the [place] FDA and  has been authorized for detection and/or diagnosis of SARS-CoV-2 by FDA under an Emergency Use Authorization (EUA). This EUA will remain  in effect (meaning this test can be used) for the duration of the COVID-19 declaration under Section 564(b)(1) of the Act, 21 U.S.C.section 360bbb-3(b)(1), unless the authorization is terminated  or revoked sooner.       Influenza A by PCR NEGATIVE NEGATIVE Final   Influenza B by PCR NEGATIVE NEGATIVE Final    Comment: (NOTE) The Xpert Xpress SARS-CoV-2/FLU/RSV plus assay is intended as an aid in the diagnosis of influenza from Nasopharyngeal swab specimens and should not be used as a sole basis for treatment. Nasal washings and aspirates are unacceptable for Xpert Xpress SARS-CoV-2/FLU/RSV testing.  Fact Sheet for Patients: EntrepreneurPulse.com.au  Fact Sheet  for Healthcare  Providers: IncredibleEmployment.be  This test is not yet approved or cleared by the [place] and has been authorized for detection and/or diagnosis of SARS-CoV-2 by FDA under an Emergency Use Authorization (EUA). This EUA will remain in effect (meaning this test can be used) for the duration of the COVID-19 declaration under Section 564(b)(1) of the Act, 21 U.S.C. section 360bbb-3(b)(1), unless the authorization is terminated or revoked.  Performed at [facility], [place] [address], [place], [place] [address]     Time spent: 35 minutes  Signed: Terrilee Croak, MD 09-09-2021

## 2021-09-12 NOTE — Progress Notes (Signed)
Notified by [facility], LPN, that patient's blood pressure was low and she was monitoring frequently, including manual measurement. She had been assessed by rapid response during the night.   After multiple assessments, Patricia Mata was in the room when the patient became unresponsive. CPR was started immediately and code blue called. Team worked with patient for almost 20 minutes until son asked that efforts stop.  Massie Bougie, RN

## 2021-09-12 NOTE — Code Documentation (Signed)
°  Patient Name: Patricia Mata   MRN: [id number]   Date of Birth/ Sex: June 24, 1956 , female      Admission Date: 08/15/2021  Attending Provider: Terrilee Croak, MD  Primary Diagnosis: Fall, initial encounter [phone]] Acute pain of right knee [M25.561] Acute left ankle pain [M25.572] Closed fracture of right distal femur ([place]) [S72.401A]   Indication: Pt was in her usual state of health until this AM, when she was noted to be Hypotensive and then went unresponsive. Code blue was subsequently called. At the time of arrival on scene, ACLS protocol was underway.   Technical Description:  - CPR performance duration:  16 minutes  - Was defibrillation or cardioversion used? No   - Was external pacer placed? Yes  - Was patient intubated pre/post CPR? Yes   Medications Administered: Y = Yes; Blank = No Amiodarone    Atropine    Calcium    Epinephrine  Yes  Lidocaine    Magnesium    Norepinephrine    Phenylephrine    Sodium bicarbonate  Yes  Vasopressin    Other    Post CPR evaluation:  - Final Status - Was patient successfully resuscitated ? No   Miscellaneous Information:  - Time of death:  8:51 AM  - Primary team notified?  Yes  - Family Notified? Yes     Timothy Lasso, MD   09/14/21, 9:03 AM

## 2021-09-12 NOTE — Progress Notes (Addendum)
Chaplain responded to Code Blue, death.  Provided spiritual and emotional support to family members, facilitated communication with staff, escorted family to visitation in room and provided refreshment and orientation.    Chaplain appreciated RN Mo,  Dr. Hermenia Bers and [facility] Ron's care and attention to family during crisis.  FH: Patricia Mata  NOK:    [name] (spouse) [address]  [place] [address]  Phone: husband's phone is [phone], HOWEVER requests phone contacts go first to sister:  [name] (sister) [phone]  Please contact as ongoing support is needed.  [name], [place] Pager:  [phone]     2021-09-23 0900  Clinical Encounter Type  Visited With Patient and family together  Visit Type Initial;Code;Death  Referral From Nurse  Consult/Referral To Chaplain  Spiritual Encounters  Spiritual Needs Prayer;Grief support  Stress Factors  Patient Stress Factors Major life changes  Family Stress Factors Loss

## 2021-09-12 NOTE — Plan of Care (Signed)
  Problem: Pain Managment: Goal: General experience of comfort will improve Outcome: Progressing   

## 2021-09-12 NOTE — Code Documentation (Signed)
Responded to code blue. Patient was PEA on my arrival. The patient was also vomiting profusely during code situation. Patient's RN reported that the BP had been soft. BP at 0824 81/45. Unfortunately, patient was not able to survive the event. Family at bedside with chaplain.

## 2021-09-12 NOTE — Progress Notes (Signed)
Pt BP were soft 0824 81/45 HR 95, P 92, O2 96 notified Dahal,Binaya MD. Son at beside when pt bp was soft nurse Environmental health practitioner. Nurse  began to become unresponsive. Nurse call rapid and pulled the code blue.

## 2021-09-12 DEATH — deceased

## 2021-09-25 ENCOUNTER — Telehealth: Payer: BC Managed Care – PPO

## 2021-10-01 ENCOUNTER — Encounter (HOSPITAL_COMMUNITY): Payer: Self-pay

## 2021-10-01 ENCOUNTER — Encounter (HOSPITAL_COMMUNITY): Payer: Medicare Other | Admitting: Internal Medicine

## 2021-10-09 ENCOUNTER — Encounter: Payer: BC Managed Care – PPO | Admitting: Nurse Practitioner

## 2021-11-12 MED FILL — Medication: Qty: 1 | Status: AC

## 2021-12-06 ENCOUNTER — Encounter (HOSPITAL_COMMUNITY): Payer: Medicare PPO | Admitting: Internal Medicine

## 2021-12-13 ENCOUNTER — Ambulatory Visit: Payer: BC Managed Care – PPO

## 2021-12-13 ENCOUNTER — Ambulatory Visit: Payer: BC Managed Care – PPO | Admitting: Nurse Practitioner

## 2022-10-09 IMAGING — CT CT ABD-PELV W/O CM
2 of 4 series · 16 of 46 positions shown, 18 images · non-contrast
Comparison: CT 06/08/2018, 05/08/2017

CLINICAL DATA: Low hemoglobin anemia

EXAM:
CT ABDOMEN AND PELVIS WITHOUT CONTRAST
TECHNIQUE: Multidetector CT imaging of the abdomen and pelvis was performed
following the standard protocol without IV contrast.

[Series 2: axial st · axial · 0.88mm/px · z∈[-470,-90]mm · 13 of 88 slices shown, 15 images]
[im 6/88  soft-tissue]
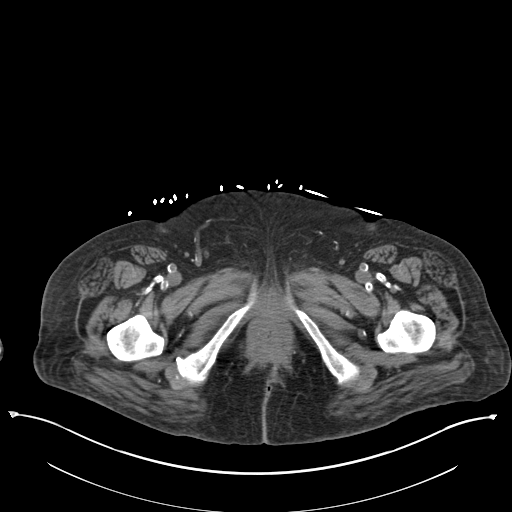
[im 6/88  bone]
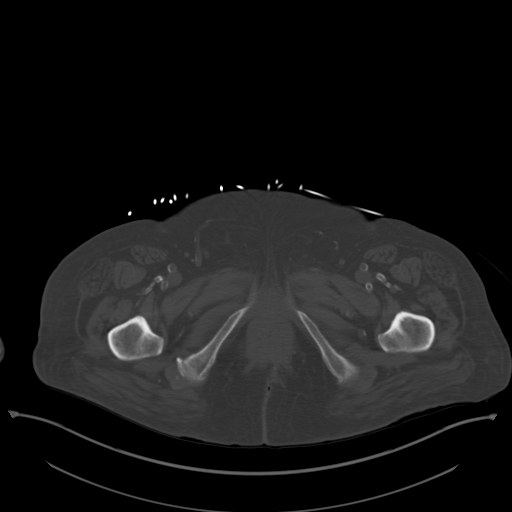
[im 11/88  soft-tissue]
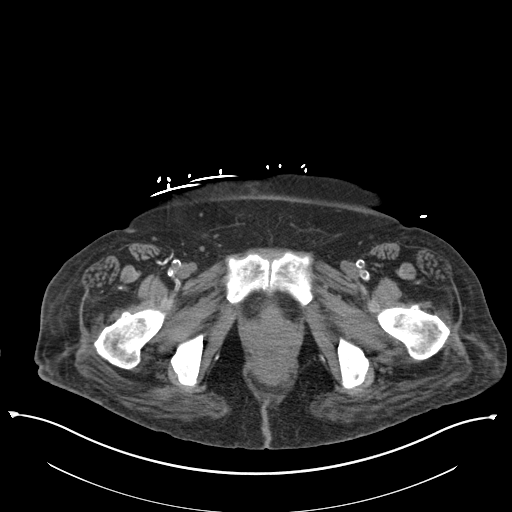
[im 21/88  soft-tissue]
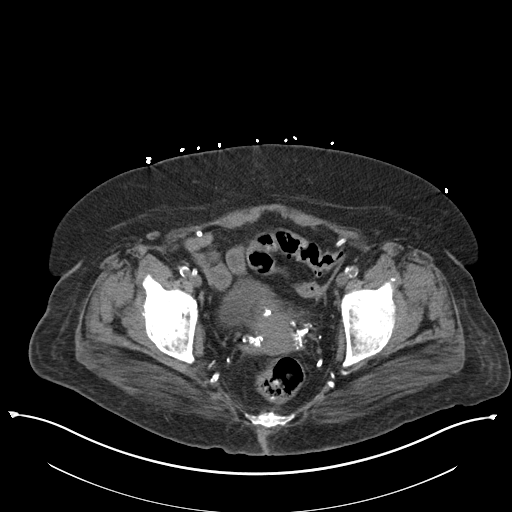
[im 26/88  soft-tissue]
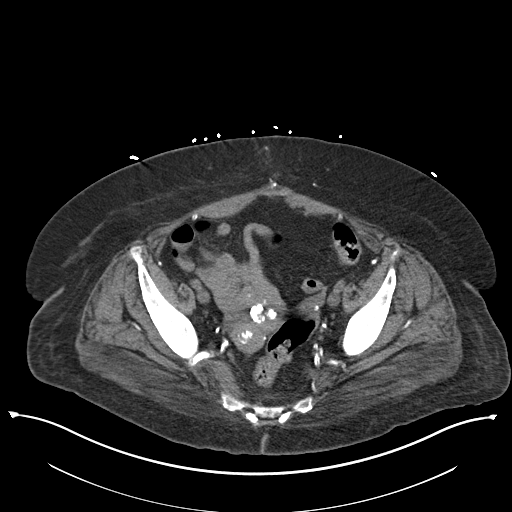
[im 31/88  soft-tissue]
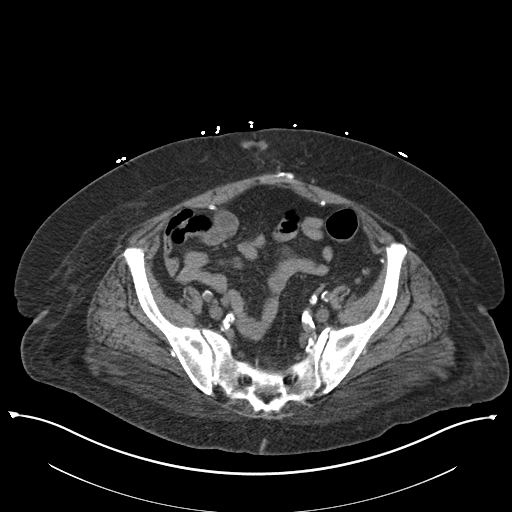
[im 36/88  soft-tissue]
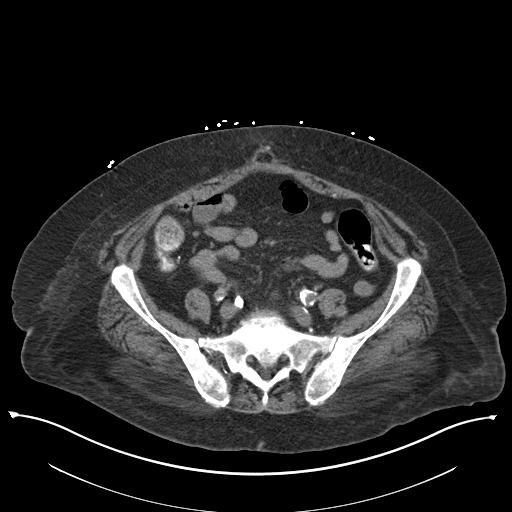
[im 47/88  soft-tissue]
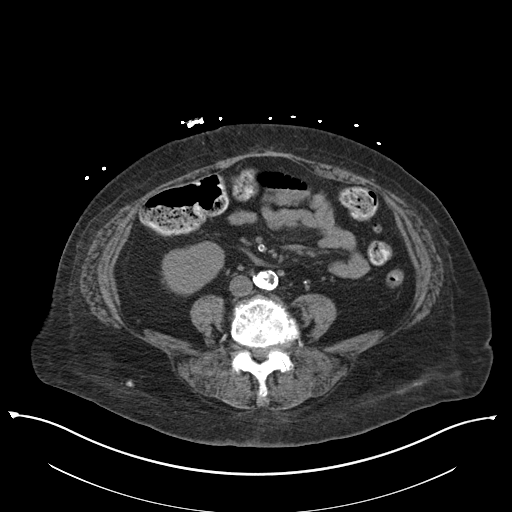
[im 52/88  soft-tissue]
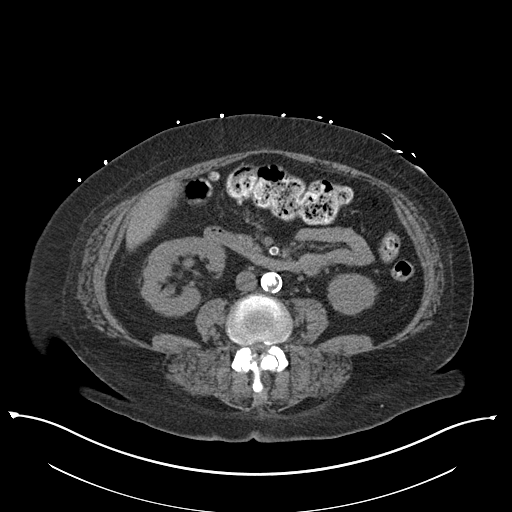
[im 57/88  soft-tissue]
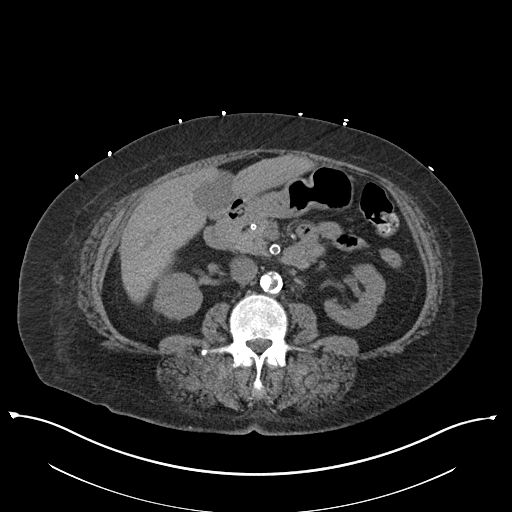
[im 57/88  bone]
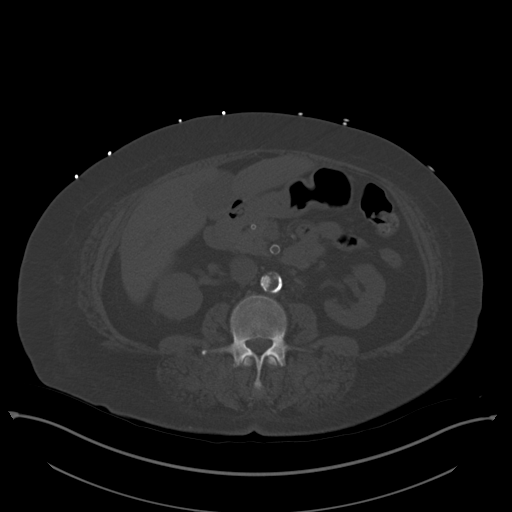
[im 62/88  soft-tissue]
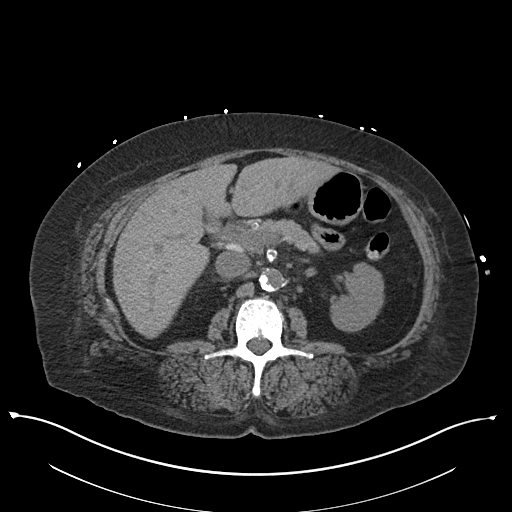
[im 67/88  soft-tissue]
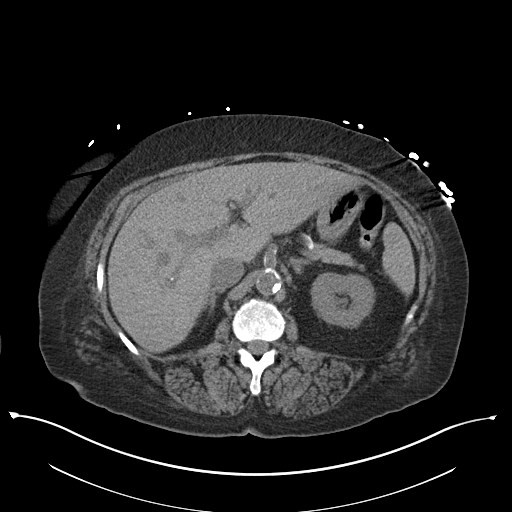
[im 77/88  soft-tissue]
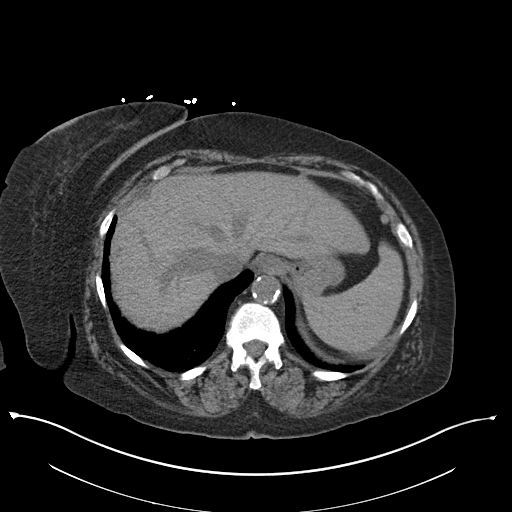
[im 82/88  soft-tissue]
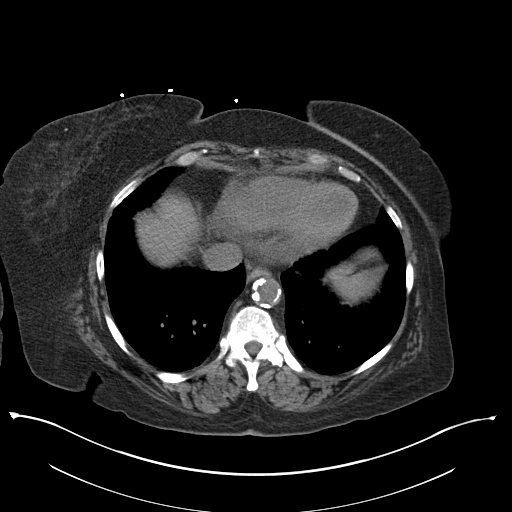

[Series 5: coronal st · coronal · 0.91mm/px · 3 of 169 slices shown]
[im 57/169  soft-tissue]
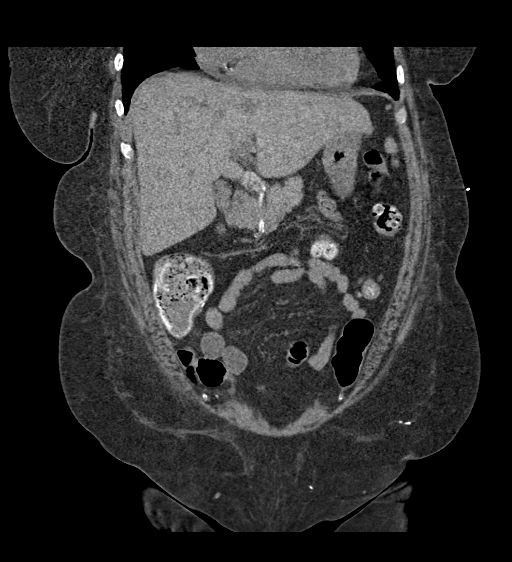
[im 75/169  soft-tissue]
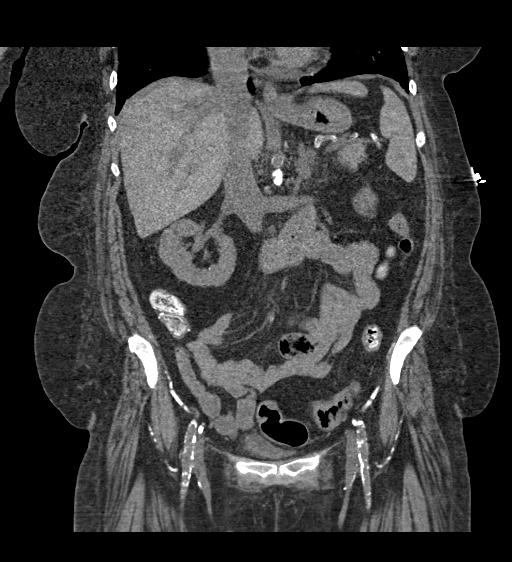
[im 94/169  soft-tissue]
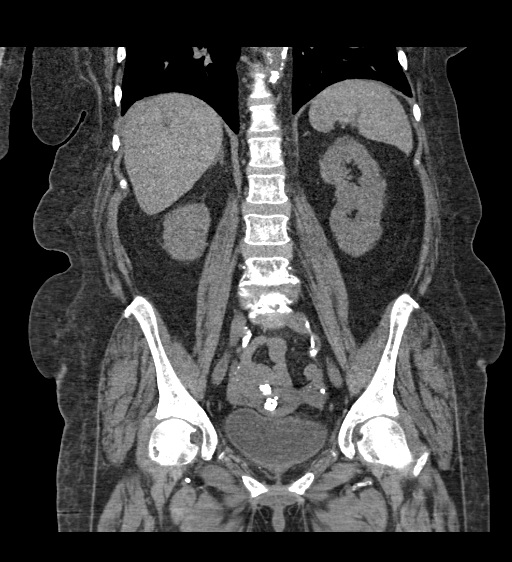

[16 of 46 positions shown; findings below may reference images not displayed]

FINDINGS: Lower chest: Lung bases demonstrate no acute consolidation or
effusion. There is cardiomegaly. Differential cardiac blood pool
consistent with history of anemia.

Hepatobiliary: No focal hepatic abnormality. No calcified gallstone
or biliary dilatation.

Pancreas: No inflammatory change or ductal dilatation

Spleen: Normal in size without focal abnormality.

Adrenals/Urinary Tract: Adrenal glands are normal. Kidneys show no
hydronephrosis. The urinary bladder is unremarkable

Stomach/Bowel: Stomach nonenlarged. No dilated small bowel. No acute
bowel wall thickening. Scattered sigmoid colon diverticula.

Vascular/Lymphatic: Extensive aortic atherosclerosis and calcified
plaque including chronic subocclusive calcification within the
infrarenal abdominal aorta. No suspicious nodes.

Reproductive: Multiple calcified uterine masses consistent with
fibroids. No adnexal mass.

Other: Negative for free air or free fluid. Small fat containing
umbilical hernia

Musculoskeletal: No acute or significant osseous findings.
IMPRESSION: 1. No CT evidence for acute intra-abdominal or pelvic abnormality.
2. Cardiomegaly. Differential cardiac blood pool likely corresponds
to history of anemia.
3. Calcified uterine fibroids.

Aortic Atherosclerosis ([id number]).
